# Patient Record
Sex: Male | Born: 1965
Health system: Southern US, Community
[De-identification: ages and names within clinical notes are randomized; demographics above are authoritative.]

## PROBLEM LIST (undated history)

## (undated) ENCOUNTER — Emergency Department (HOSPITAL_COMMUNITY): Admission: EM | Payer: Medicare HMO

## (undated) DIAGNOSIS — B20 Human immunodeficiency virus [HIV] disease: Secondary | ICD-10-CM

## (undated) DIAGNOSIS — E1165 Type 2 diabetes mellitus with hyperglycemia: Secondary | ICD-10-CM

## (undated) DIAGNOSIS — L97509 Non-pressure chronic ulcer of other part of unspecified foot with unspecified severity: Secondary | ICD-10-CM

## (undated) DIAGNOSIS — G8929 Other chronic pain: Secondary | ICD-10-CM

## (undated) DIAGNOSIS — Z91148 Patient's other noncompliance with medication regimen for other reason: Secondary | ICD-10-CM

## (undated) DIAGNOSIS — IMO0002 Reserved for concepts with insufficient information to code with codable children: Secondary | ICD-10-CM

## (undated) DIAGNOSIS — I77819 Aortic ectasia, unspecified site: Secondary | ICD-10-CM

## (undated) DIAGNOSIS — E11621 Type 2 diabetes mellitus with foot ulcer: Secondary | ICD-10-CM

## (undated) DIAGNOSIS — M869 Osteomyelitis, unspecified: Secondary | ICD-10-CM

## (undated) DIAGNOSIS — A630 Anogenital (venereal) warts: Secondary | ICD-10-CM

## (undated) DIAGNOSIS — I219 Acute myocardial infarction, unspecified: Secondary | ICD-10-CM

## (undated) DIAGNOSIS — K219 Gastro-esophageal reflux disease without esophagitis: Secondary | ICD-10-CM

## (undated) DIAGNOSIS — L02611 Cutaneous abscess of right foot: Secondary | ICD-10-CM

## (undated) DIAGNOSIS — N184 Chronic kidney disease, stage 4 (severe): Secondary | ICD-10-CM

## (undated) DIAGNOSIS — I2510 Atherosclerotic heart disease of native coronary artery without angina pectoris: Secondary | ICD-10-CM

## (undated) DIAGNOSIS — G629 Polyneuropathy, unspecified: Secondary | ICD-10-CM

## (undated) DIAGNOSIS — E785 Hyperlipidemia, unspecified: Secondary | ICD-10-CM

## (undated) DIAGNOSIS — J189 Pneumonia, unspecified organism: Secondary | ICD-10-CM

## (undated) DIAGNOSIS — D649 Anemia, unspecified: Secondary | ICD-10-CM

## (undated) DIAGNOSIS — N529 Male erectile dysfunction, unspecified: Secondary | ICD-10-CM

## (undated) DIAGNOSIS — Z9289 Personal history of other medical treatment: Secondary | ICD-10-CM

## (undated) DIAGNOSIS — N182 Chronic kidney disease, stage 2 (mild): Secondary | ICD-10-CM

## (undated) DIAGNOSIS — I10 Essential (primary) hypertension: Secondary | ICD-10-CM

## (undated) DIAGNOSIS — Z9114 Patient's other noncompliance with medication regimen: Secondary | ICD-10-CM

## (undated) DIAGNOSIS — M25569 Pain in unspecified knee: Secondary | ICD-10-CM

## (undated) HISTORY — DX: Hyperlipidemia, unspecified: E78.5

## (undated) HISTORY — DX: Osteomyelitis, unspecified: M86.9

## (undated) HISTORY — DX: Anemia, unspecified: D64.9

## (undated) HISTORY — DX: Patient's other noncompliance with medication regimen for other reason: Z91.148

## (undated) HISTORY — DX: Aortic ectasia, unspecified site: I77.819

## (undated) HISTORY — DX: Anogenital (venereal) warts: A63.0

## (undated) HISTORY — DX: Human immunodeficiency virus (HIV) disease: B20

## (undated) HISTORY — DX: Other chronic pain: G89.29

## (undated) HISTORY — DX: Cutaneous abscess of right foot: L02.611

## (undated) HISTORY — DX: Male erectile dysfunction, unspecified: N52.9

## (undated) HISTORY — DX: Type 2 diabetes mellitus with hyperglycemia: E11.65

## (undated) HISTORY — DX: Chronic kidney disease, stage 4 (severe): N18.4

## (undated) HISTORY — DX: Atherosclerotic heart disease of native coronary artery without angina pectoris: I25.10

## (undated) HISTORY — PX: HERNIA REPAIR: SHX51

## (undated) HISTORY — PX: BELOW KNEE LEG AMPUTATION: SUR23

## (undated) HISTORY — DX: Essential (primary) hypertension: I10

## (undated) HISTORY — DX: Patient's other noncompliance with medication regimen: Z91.14

## (undated) HISTORY — DX: Pain in unspecified knee: M25.569

## (undated) HISTORY — DX: Reserved for concepts with insufficient information to code with codable children: IMO0002

---
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
## 1997-12-05 ENCOUNTER — Emergency Department (HOSPITAL_COMMUNITY): Admission: EM | Admit: 1997-12-05 | Discharge: 1997-12-05 | Payer: Self-pay | Admitting: Emergency Medicine

## 1997-12-05 ENCOUNTER — Encounter: Payer: Self-pay | Admitting: Emergency Medicine

## 1998-07-27 ENCOUNTER — Emergency Department (HOSPITAL_COMMUNITY): Admission: EM | Admit: 1998-07-27 | Discharge: 1998-07-27 | Payer: Self-pay | Admitting: *Deleted

## 1998-08-31 ENCOUNTER — Emergency Department (HOSPITAL_COMMUNITY): Admission: EM | Admit: 1998-08-31 | Discharge: 1998-08-31 | Payer: Self-pay | Admitting: Emergency Medicine

## 1999-06-08 ENCOUNTER — Emergency Department (HOSPITAL_COMMUNITY): Admission: EM | Admit: 1999-06-08 | Discharge: 1999-06-08 | Payer: Self-pay | Admitting: Emergency Medicine

## 1999-06-11 ENCOUNTER — Emergency Department (HOSPITAL_COMMUNITY): Admission: EM | Admit: 1999-06-11 | Discharge: 1999-06-11 | Payer: Self-pay | Admitting: Emergency Medicine

## 1999-06-12 ENCOUNTER — Emergency Department (HOSPITAL_COMMUNITY): Admission: EM | Admit: 1999-06-12 | Discharge: 1999-06-12 | Payer: Self-pay | Admitting: Emergency Medicine

## 2001-01-04 ENCOUNTER — Emergency Department (HOSPITAL_COMMUNITY): Admission: EM | Admit: 2001-01-04 | Discharge: 2001-01-04 | Payer: Self-pay | Admitting: Emergency Medicine

## 2001-08-23 ENCOUNTER — Emergency Department (HOSPITAL_COMMUNITY): Admission: EM | Admit: 2001-08-23 | Discharge: 2001-08-23 | Payer: Self-pay | Admitting: Emergency Medicine

## 2001-10-03 ENCOUNTER — Emergency Department (HOSPITAL_COMMUNITY): Admission: EM | Admit: 2001-10-03 | Discharge: 2001-10-03 | Payer: Self-pay | Admitting: Emergency Medicine

## 2003-01-12 ENCOUNTER — Emergency Department (HOSPITAL_COMMUNITY): Admission: EM | Admit: 2003-01-12 | Discharge: 2003-01-12 | Payer: Self-pay | Admitting: Emergency Medicine

## 2003-07-23 ENCOUNTER — Emergency Department (HOSPITAL_COMMUNITY): Admission: EM | Admit: 2003-07-23 | Discharge: 2003-07-23 | Payer: Self-pay

## 2003-09-02 ENCOUNTER — Emergency Department (HOSPITAL_COMMUNITY): Admission: EM | Admit: 2003-09-02 | Discharge: 2003-09-02 | Payer: Self-pay | Admitting: Emergency Medicine

## 2004-07-03 ENCOUNTER — Ambulatory Visit: Payer: Self-pay | Admitting: Internal Medicine

## 2004-07-03 ENCOUNTER — Ambulatory Visit: Payer: Self-pay | Admitting: *Deleted

## 2004-07-25 ENCOUNTER — Ambulatory Visit: Payer: Self-pay | Admitting: Internal Medicine

## 2004-08-29 ENCOUNTER — Ambulatory Visit: Payer: Self-pay | Admitting: Internal Medicine

## 2004-09-14 ENCOUNTER — Ambulatory Visit: Payer: Self-pay | Admitting: Internal Medicine

## 2004-09-17 ENCOUNTER — Ambulatory Visit: Payer: Self-pay | Admitting: Internal Medicine

## 2005-01-29 ENCOUNTER — Emergency Department (HOSPITAL_COMMUNITY): Admission: EM | Admit: 2005-01-29 | Discharge: 2005-01-29 | Payer: Self-pay | Admitting: Emergency Medicine

## 2005-03-19 ENCOUNTER — Ambulatory Visit: Payer: Self-pay | Admitting: Family Medicine

## 2005-04-10 ENCOUNTER — Ambulatory Visit: Payer: Self-pay | Admitting: Internal Medicine

## 2005-06-05 ENCOUNTER — Ambulatory Visit: Payer: Self-pay | Admitting: Internal Medicine

## 2005-06-12 ENCOUNTER — Ambulatory Visit: Payer: Self-pay | Admitting: Internal Medicine

## 2005-06-18 ENCOUNTER — Ambulatory Visit: Payer: Self-pay | Admitting: Internal Medicine

## 2005-08-22 ENCOUNTER — Ambulatory Visit: Payer: Self-pay | Admitting: Internal Medicine

## 2005-11-21 ENCOUNTER — Ambulatory Visit: Payer: Self-pay | Admitting: Internal Medicine

## 2005-11-21 LAB — CONVERTED CEMR LAB
Hep B S Ab: NEGATIVE
Hepatitis B Surface Ag: NEGATIVE

## 2006-09-08 ENCOUNTER — Ambulatory Visit: Payer: Self-pay | Admitting: Internal Medicine

## 2006-10-06 DIAGNOSIS — B20 Human immunodeficiency virus [HIV] disease: Secondary | ICD-10-CM | POA: Insufficient documentation

## 2006-12-24 ENCOUNTER — Encounter (INDEPENDENT_AMBULATORY_CARE_PROVIDER_SITE_OTHER): Payer: Self-pay | Admitting: *Deleted

## 2007-03-31 ENCOUNTER — Ambulatory Visit: Payer: Self-pay | Admitting: Internal Medicine

## 2007-04-07 ENCOUNTER — Emergency Department (HOSPITAL_COMMUNITY): Admission: EM | Admit: 2007-04-07 | Discharge: 2007-04-07 | Payer: Self-pay | Admitting: Emergency Medicine

## 2007-04-09 DIAGNOSIS — Z21 Asymptomatic human immunodeficiency virus [HIV] infection status: Secondary | ICD-10-CM

## 2007-04-09 DIAGNOSIS — B20 Human immunodeficiency virus [HIV] disease: Secondary | ICD-10-CM

## 2007-04-09 HISTORY — DX: Asymptomatic human immunodeficiency virus (hiv) infection status: Z21

## 2007-04-09 HISTORY — DX: Human immunodeficiency virus (HIV) disease: B20

## 2007-05-01 ENCOUNTER — Emergency Department (HOSPITAL_COMMUNITY): Admission: EM | Admit: 2007-05-01 | Discharge: 2007-05-01 | Payer: Self-pay | Admitting: Emergency Medicine

## 2007-05-21 ENCOUNTER — Ambulatory Visit: Payer: Self-pay | Admitting: Internal Medicine

## 2007-10-05 ENCOUNTER — Encounter (INDEPENDENT_AMBULATORY_CARE_PROVIDER_SITE_OTHER): Payer: Self-pay | Admitting: Family Medicine

## 2007-11-04 ENCOUNTER — Emergency Department (HOSPITAL_COMMUNITY): Admission: EM | Admit: 2007-11-04 | Discharge: 2007-11-04 | Payer: Self-pay | Admitting: Emergency Medicine

## 2007-11-04 ENCOUNTER — Ambulatory Visit (HOSPITAL_COMMUNITY): Admission: RE | Admit: 2007-11-04 | Discharge: 2007-11-04 | Payer: Self-pay | Admitting: Emergency Medicine

## 2007-11-26 ENCOUNTER — Ambulatory Visit: Payer: Self-pay | Admitting: Internal Medicine

## 2008-03-24 ENCOUNTER — Ambulatory Visit: Payer: Self-pay | Admitting: Internal Medicine

## 2008-04-04 ENCOUNTER — Ambulatory Visit: Payer: Self-pay | Admitting: Internal Medicine

## 2008-04-14 ENCOUNTER — Ambulatory Visit: Payer: Self-pay | Admitting: Internal Medicine

## 2008-04-14 LAB — CONVERTED CEMR LAB: CD4 Count: 198 microliters

## 2008-04-15 ENCOUNTER — Encounter: Payer: Self-pay | Admitting: Internal Medicine

## 2008-04-15 LAB — CONVERTED CEMR LAB
ALT: 28 units/L (ref 0–53)
AST: 22 units/L (ref 0–37)
Absolute CD4: 198 #/uL — ABNORMAL LOW (ref 381–1469)
Albumin: 4.6 g/dL (ref 3.5–5.2)
Alkaline Phosphatase: 86 units/L (ref 39–117)
BUN: 25 mg/dL — ABNORMAL HIGH (ref 6–23)
Basophils Absolute: 0 10*3/uL (ref 0.0–0.1)
Basophils Relative: 0 % (ref 0–1)
CD4 T Helper %: 10 % — ABNORMAL LOW (ref 32–62)
CO2: 20 meq/L (ref 19–32)
Calcium: 9.9 mg/dL (ref 8.4–10.5)
Chlamydia, Swab/Urine, PCR: NEGATIVE
Chloride: 95 meq/L — ABNORMAL LOW (ref 96–112)
Creatinine, Ser: 1.29 mg/dL (ref 0.40–1.50)
Eosinophils Absolute: 0.1 10*3/uL (ref 0.0–0.7)
Eosinophils Relative: 1 % (ref 0–5)
GC Probe Amp, Urine: NEGATIVE
Glucose, Bld: 346 mg/dL — ABNORMAL HIGH (ref 70–99)
HCT: 41.6 % (ref 39.0–52.0)
HIV 1 RNA Quant: <48 copies/mL (ref <48)
HIV-1 RNA Quant, Log: <1.68 (ref <1.68)
Hemoglobin: 13.5 g/dL (ref 13.0–17.0)
Lymphocytes Relative: 33 % (ref 12–46)
Lymphs Abs: 2 10*3/uL (ref 0.7–4.0)
MCHC: 32.5 g/dL (ref 30.0–36.0)
MCV: 85.1 fL (ref 78.0–100.0)
Microalb, Ur: 5.31 mg/dL — ABNORMAL HIGH (ref 0.00–1.89)
Monocytes Absolute: 0.4 10*3/uL (ref 0.1–1.0)
Monocytes Relative: 7 % (ref 3–12)
Neutro Abs: 3.5 10*3/uL (ref 1.7–7.7)
Neutrophils Relative %: 59 % (ref 43–77)
Platelets: 199 10*3/uL (ref 150–400)
Potassium: 4.7 meq/L (ref 3.5–5.3)
RBC: 4.89 M/uL (ref 4.22–5.81)
RDW: 13.5 % (ref 11.5–15.5)
Sodium: 132 meq/L — ABNORMAL LOW (ref 135–145)
Testosterone: 238.95 ng/dL — ABNORMAL LOW (ref 350–890)
Total Bilirubin: 1.8 mg/dL — ABNORMAL HIGH (ref 0.3–1.2)
Total Lymphocytes %: 33 % (ref 12–46)
Total Protein: 8.7 g/dL — ABNORMAL HIGH (ref 6.0–8.3)
Total lymphocyte count: 1980 cells/mcL (ref 700–3300)
WBC, lymph enumeration: 6 10*3/uL (ref 4.0–10.5)
WBC: 6 10*3/uL (ref 4.0–10.5)

## 2008-04-21 ENCOUNTER — Ambulatory Visit: Payer: Self-pay | Admitting: Internal Medicine

## 2008-05-05 ENCOUNTER — Ambulatory Visit: Payer: Self-pay | Admitting: Internal Medicine

## 2008-05-19 ENCOUNTER — Encounter: Payer: Self-pay | Admitting: Internal Medicine

## 2008-05-19 ENCOUNTER — Ambulatory Visit: Payer: Self-pay | Admitting: Internal Medicine

## 2008-05-19 DIAGNOSIS — I10 Essential (primary) hypertension: Secondary | ICD-10-CM | POA: Insufficient documentation

## 2008-05-19 DIAGNOSIS — E119 Type 2 diabetes mellitus without complications: Secondary | ICD-10-CM | POA: Insufficient documentation

## 2008-05-19 LAB — CONVERTED CEMR LAB: Blood Glucose, Fingerstick: 188

## 2008-07-15 ENCOUNTER — Telehealth: Payer: Self-pay | Admitting: Internal Medicine

## 2008-07-18 ENCOUNTER — Encounter: Payer: Self-pay | Admitting: Internal Medicine

## 2008-07-18 LAB — CONVERTED CEMR LAB: HCV Ab: NEGATIVE

## 2008-08-03 ENCOUNTER — Telehealth: Payer: Self-pay | Admitting: Internal Medicine

## 2008-08-09 ENCOUNTER — Ambulatory Visit: Payer: Self-pay | Admitting: Internal Medicine

## 2008-08-11 ENCOUNTER — Encounter: Payer: Self-pay | Admitting: Internal Medicine

## 2008-08-11 ENCOUNTER — Ambulatory Visit: Payer: Self-pay | Admitting: Internal Medicine

## 2008-08-11 DIAGNOSIS — A630 Anogenital (venereal) warts: Secondary | ICD-10-CM | POA: Insufficient documentation

## 2008-08-11 LAB — CONVERTED CEMR LAB
ALT: 23 units/L (ref 0–53)
AST: 28 units/L (ref 0–37)
Absolute CD4: 209 #/uL — ABNORMAL LOW (ref 381–1469)
Albumin: 4.6 g/dL (ref 3.5–5.2)
Alkaline Phosphatase: 54 units/L (ref 39–117)
BUN: 12 mg/dL (ref 6–23)
Basophils Absolute: 0 10*3/uL (ref 0.0–0.1)
Basophils Relative: 0 % (ref 0–1)
Blood Glucose, Fingerstick: 164
CD4 T Helper %: 12 % — ABNORMAL LOW (ref 32–62)
CO2: 26 meq/L (ref 19–32)
Calcium: 10 mg/dL (ref 8.4–10.5)
Chloride: 104 meq/L (ref 96–112)
Creatinine, Ser: 1.08 mg/dL (ref 0.40–1.50)
Eosinophils Absolute: 0.1 10*3/uL (ref 0.0–0.7)
Eosinophils Relative: 1 % (ref 0–5)
Glucose, Bld: 112 mg/dL — ABNORMAL HIGH (ref 70–99)
HCT: 41.7 % (ref 39.0–52.0)
HIV 1 RNA Quant: 231 copies/mL — ABNORMAL HIGH (ref <48)
HIV-1 RNA Quant, Log: 2.36 — ABNORMAL HIGH (ref <1.68)
Hemoglobin: 13.6 g/dL (ref 13.0–17.0)
Lymphocytes Relative: 22 % (ref 12–46)
Lymphs Abs: 1.7 10*3/uL (ref 0.7–4.0)
MCHC: 32.6 g/dL (ref 30.0–36.0)
MCV: 84.1 fL (ref 78.0–100.0)
Monocytes Absolute: 0.4 10*3/uL (ref 0.1–1.0)
Monocytes Relative: 5 % (ref 3–12)
Neutro Abs: 5.7 10*3/uL (ref 1.7–7.7)
Neutrophils Relative %: 72 % (ref 43–77)
Platelets: 250 10*3/uL (ref 150–400)
Potassium: 4.1 meq/L (ref 3.5–5.3)
RBC: 4.96 M/uL (ref 4.22–5.81)
RDW: 13.8 % (ref 11.5–15.5)
Sodium: 141 meq/L (ref 135–145)
Total Bilirubin: 0.4 mg/dL (ref 0.3–1.2)
Total Lymphocytes %: 22 % (ref 12–46)
Total Protein: 8.2 g/dL (ref 6.0–8.3)
Total lymphocyte count: 1738 cells/mcL (ref 700–3300)
WBC, lymph enumeration: 7.9 10*3/uL (ref 4.0–10.5)
WBC: 7.9 10*3/uL (ref 4.0–10.5)

## 2008-08-22 ENCOUNTER — Ambulatory Visit: Payer: Self-pay | Admitting: Internal Medicine

## 2008-08-22 ENCOUNTER — Encounter: Payer: Self-pay | Admitting: Internal Medicine

## 2008-08-22 DIAGNOSIS — B359 Dermatophytosis, unspecified: Secondary | ICD-10-CM | POA: Insufficient documentation

## 2008-08-22 DIAGNOSIS — F528 Other sexual dysfunction not due to a substance or known physiological condition: Secondary | ICD-10-CM | POA: Insufficient documentation

## 2008-08-22 LAB — CONVERTED CEMR LAB: Blood Glucose, Fingerstick: 97

## 2008-09-06 ENCOUNTER — Telehealth: Payer: Self-pay | Admitting: Internal Medicine
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
## 2008-09-30 IMAGING — CR DG FOREARM 2V*R*
2 series · 2 of 2 positions shown · non-contrast
Comparison: No priors

CLINICAL DATA: Right wrist injury

RIGHT FOREARM - 2 VIEW

[view not recorded (1 of 2)]
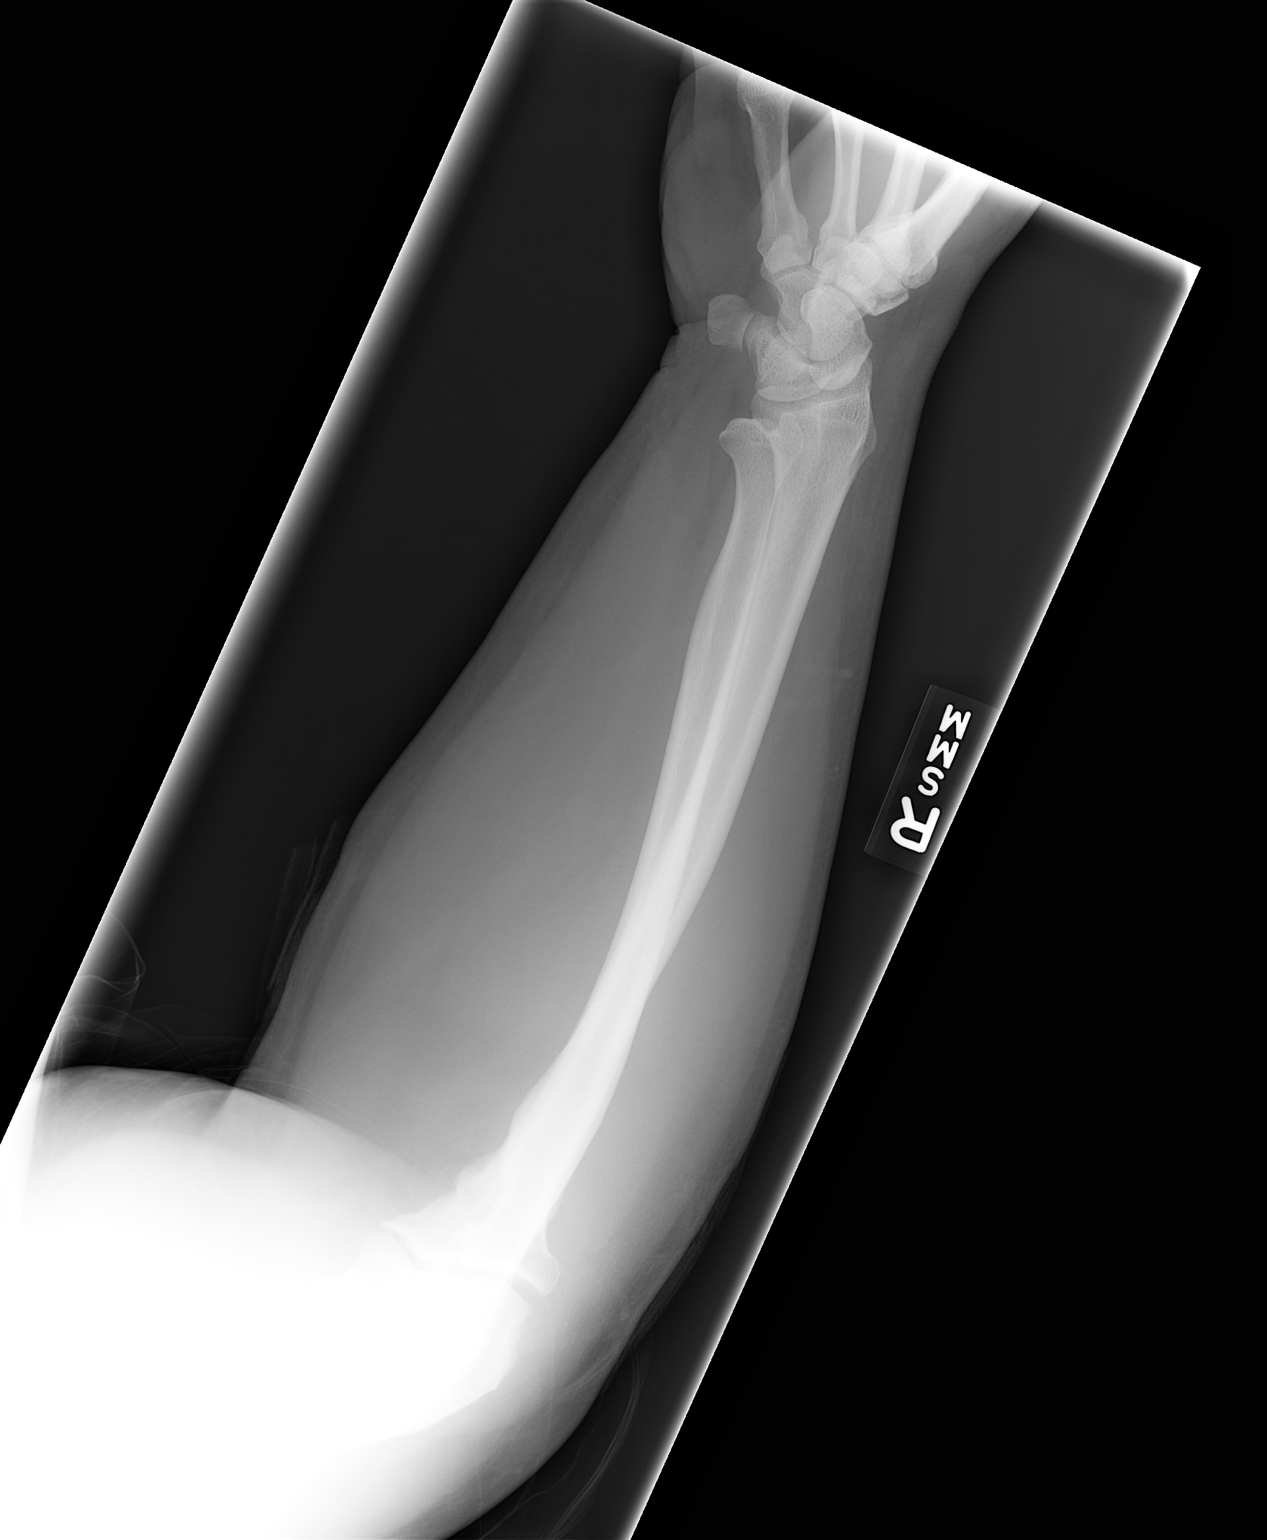

[view not recorded (2 of 2)]
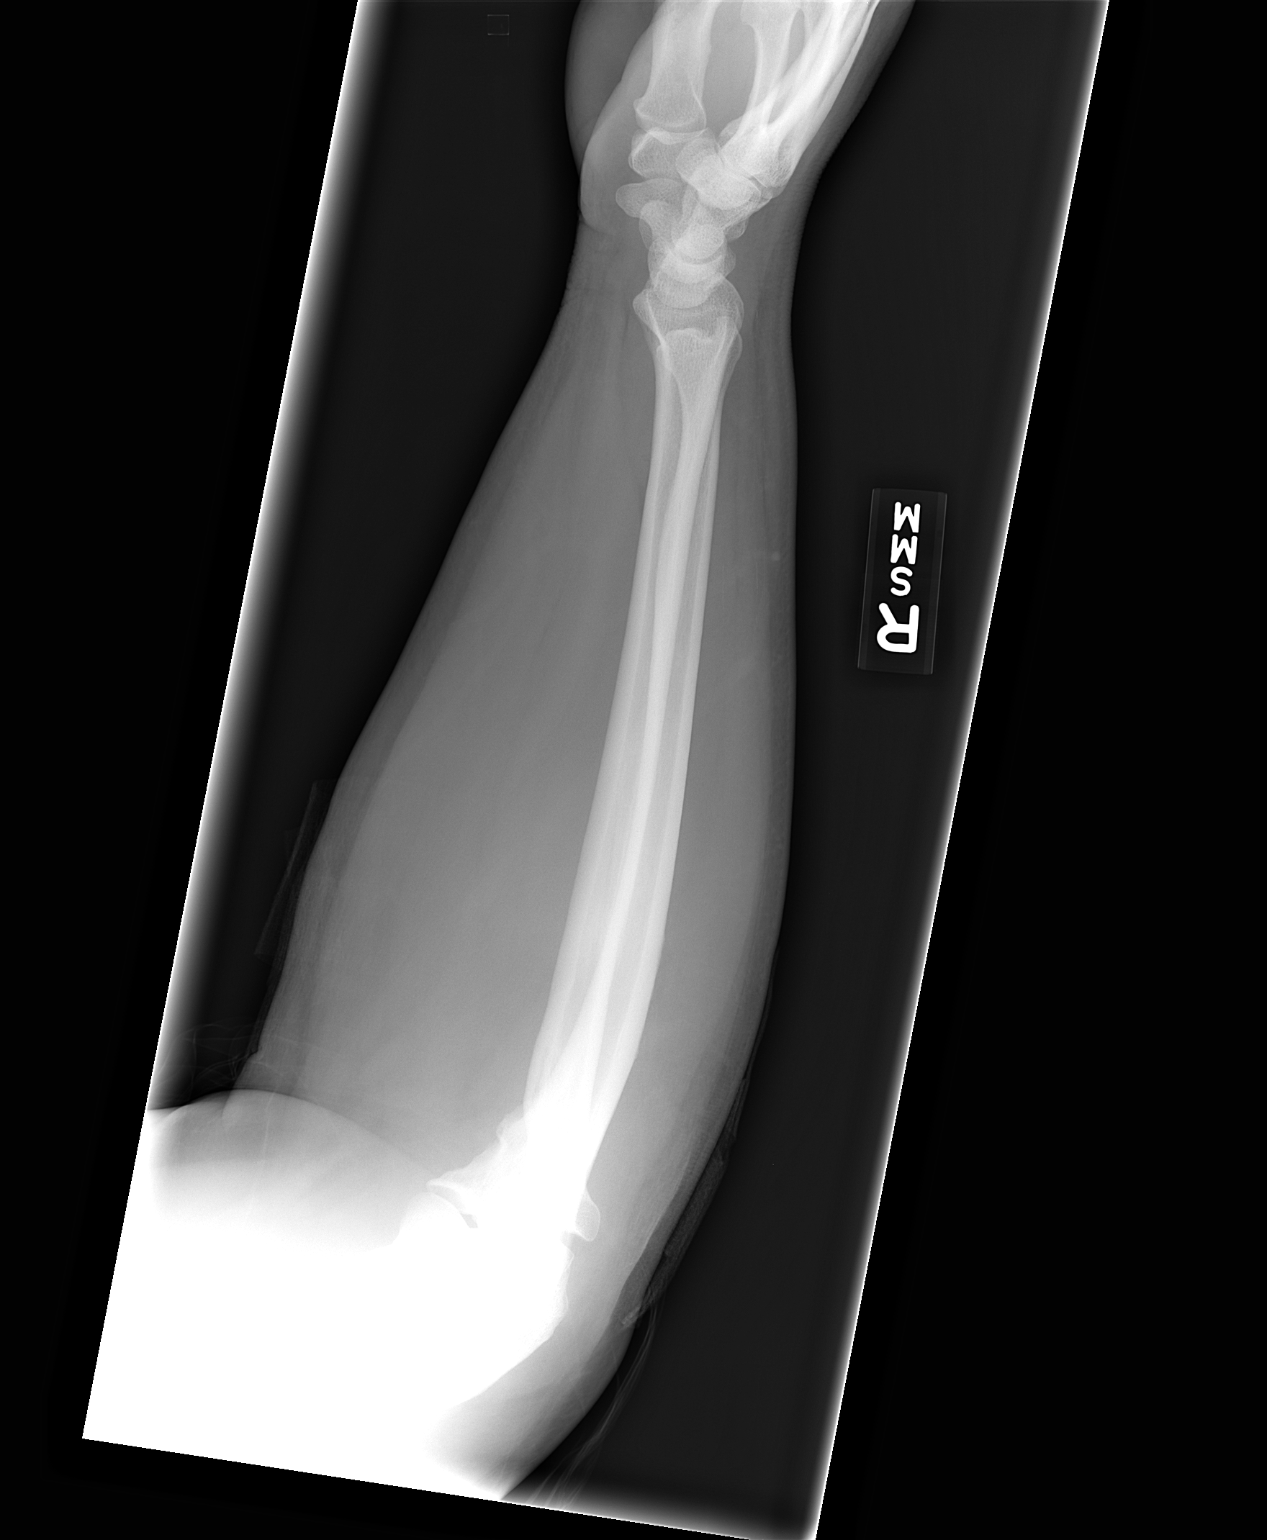

[2 of 2 positions shown; findings below may reference images not displayed]

FINDINGS: There is no evidence of fracture or other focal bone
lesions.  Soft tissues are unremarkable.
IMPRESSION: Negative.

## 2008-11-24 ENCOUNTER — Emergency Department (HOSPITAL_COMMUNITY): Admission: EM | Admit: 2008-11-24 | Discharge: 2008-11-24 | Payer: Self-pay | Admitting: Emergency Medicine

## 2008-12-03 ENCOUNTER — Emergency Department (HOSPITAL_COMMUNITY): Admission: EM | Admit: 2008-12-03 | Discharge: 2008-12-03 | Payer: Self-pay | Admitting: Family Medicine

## 2008-12-08 ENCOUNTER — Encounter: Payer: Self-pay | Admitting: Internal Medicine

## 2008-12-08 ENCOUNTER — Ambulatory Visit: Payer: Self-pay | Admitting: Internal Medicine

## 2008-12-08 LAB — CONVERTED CEMR LAB: Blood Glucose, Fingerstick: 218

## 2008-12-13 ENCOUNTER — Telehealth: Payer: Self-pay | Admitting: Internal Medicine

## 2008-12-14 ENCOUNTER — Encounter (INDEPENDENT_AMBULATORY_CARE_PROVIDER_SITE_OTHER): Payer: Self-pay | Admitting: *Deleted

## 2008-12-14 LAB — CONVERTED CEMR LAB
ALT: 30 units/L (ref 0–53)
AST: 33 units/L (ref 0–37)
Absolute CD4: 180 #/uL — ABNORMAL LOW (ref 381–1469)
Albumin: 4.5 g/dL (ref 3.5–5.2)
Alkaline Phosphatase: 60 units/L (ref 39–117)
BUN: 13 mg/dL (ref 6–23)
Basophils Absolute: 0 10*3/uL (ref 0.0–0.1)
Basophils Relative: 0 % (ref 0–1)
CD4 T Helper %: 11 % — ABNORMAL LOW (ref 32–62)
CO2: 22 meq/L (ref 19–32)
Calcium: 9.7 mg/dL (ref 8.4–10.5)
Chloride: 103 meq/L (ref 96–112)
Cholesterol: 240 mg/dL — ABNORMAL HIGH (ref 0–200)
Creatinine, Ser: 0.92 mg/dL (ref 0.40–1.50)
Eosinophils Absolute: 0.1 10*3/uL (ref 0.0–0.7)
Eosinophils Relative: 1 % (ref 0–5)
Glucose, Bld: 193 mg/dL — ABNORMAL HIGH (ref 70–99)
HCT: 40 % (ref 39.0–52.0)
HDL: 34 mg/dL — ABNORMAL LOW (ref >39)
HIV 1 RNA Quant: 2700 copies/mL — ABNORMAL HIGH (ref <48)
HIV-1 RNA Quant, Log: 3.43 — ABNORMAL HIGH (ref <1.68)
Hemoglobin: 12.7 g/dL — ABNORMAL LOW (ref 13.0–17.0)
LDL Cholesterol: 159 mg/dL — ABNORMAL HIGH (ref 0–99)
Lymphocytes Relative: 40 % (ref 12–46)
Lymphs Abs: 1.7 10*3/uL (ref 0.7–4.0)
MCHC: 31.8 g/dL (ref 30.0–36.0)
MCV: 86.4 fL (ref 78.0–100.0)
Monocytes Absolute: 0.7 10*3/uL (ref 0.1–1.0)
Monocytes Relative: 17 % — ABNORMAL HIGH (ref 3–12)
Neutro Abs: 1.7 10*3/uL (ref 1.7–7.7)
Neutrophils Relative %: 41 % — ABNORMAL LOW (ref 43–77)
Platelets: 240 10*3/uL (ref 150–400)
Potassium: 4 meq/L (ref 3.5–5.3)
RBC: 4.63 M/uL (ref 4.22–5.81)
RDW: 14.3 % (ref 11.5–15.5)
Sodium: 139 meq/L (ref 135–145)
Total Bilirubin: 1.1 mg/dL (ref 0.3–1.2)
Total CHOL/HDL Ratio: 7.1
Total Protein: 8.2 g/dL (ref 6.0–8.3)
Total lymphocyte count: 1640 cells/mcL (ref 700–3300)
Triglycerides: 234 mg/dL — ABNORMAL HIGH (ref <150)
VLDL: 47 mg/dL — ABNORMAL HIGH (ref 0–40)
WBC: 4.1 10*3/uL (ref 4.0–10.5)

## 2008-12-26 ENCOUNTER — Telehealth: Payer: Self-pay | Admitting: Internal Medicine

## 2009-01-02 ENCOUNTER — Encounter: Payer: Self-pay | Admitting: Internal Medicine

## 2009-01-02 ENCOUNTER — Ambulatory Visit: Payer: Self-pay | Admitting: Internal Medicine

## 2009-01-02 LAB — CONVERTED CEMR LAB: Blood Glucose, Fingerstick: 592

## 2009-01-05 ENCOUNTER — Ambulatory Visit: Payer: Self-pay | Admitting: Internal Medicine

## 2009-01-05 ENCOUNTER — Encounter: Payer: Self-pay | Admitting: Internal Medicine

## 2009-01-05 LAB — CONVERTED CEMR LAB: Blood Glucose, Fingerstick: 487

## 2009-01-09 ENCOUNTER — Telehealth: Payer: Self-pay | Admitting: Internal Medicine

## 2009-01-12 ENCOUNTER — Encounter: Payer: Self-pay | Admitting: Internal Medicine

## 2009-01-12 ENCOUNTER — Ambulatory Visit: Payer: Self-pay | Admitting: Internal Medicine

## 2009-01-12 DIAGNOSIS — L03019 Cellulitis of unspecified finger: Secondary | ICD-10-CM | POA: Insufficient documentation

## 2009-01-12 LAB — CONVERTED CEMR LAB: Blood Glucose, Fingerstick: >600

## 2009-01-25 ENCOUNTER — Inpatient Hospital Stay (HOSPITAL_COMMUNITY): Admission: EM | Admit: 2009-01-25 | Discharge: 2009-01-28 | Payer: Self-pay | Admitting: Emergency Medicine

## 2009-02-07 ENCOUNTER — Telehealth: Payer: Self-pay | Admitting: Internal Medicine

## 2009-02-07 ENCOUNTER — Inpatient Hospital Stay (HOSPITAL_COMMUNITY): Admission: EM | Admit: 2009-02-07 | Discharge: 2009-02-11 | Payer: Self-pay | Admitting: Emergency Medicine

## 2009-02-13 ENCOUNTER — Emergency Department (HOSPITAL_COMMUNITY): Admission: EM | Admit: 2009-02-13 | Discharge: 2009-02-14 | Payer: Self-pay | Admitting: Emergency Medicine

## 2009-02-16 ENCOUNTER — Ambulatory Visit: Payer: Self-pay | Admitting: Family Medicine

## 2009-02-16 ENCOUNTER — Encounter: Payer: Self-pay | Admitting: Internal Medicine

## 2009-02-16 LAB — CONVERTED CEMR LAB: Blood Glucose, Fingerstick: 257

## 2009-02-28 ENCOUNTER — Encounter: Payer: Self-pay | Admitting: Internal Medicine

## 2009-03-05 ENCOUNTER — Observation Stay (HOSPITAL_COMMUNITY): Admission: EM | Admit: 2009-03-05 | Discharge: 2009-03-06 | Payer: Self-pay | Admitting: Emergency Medicine

## 2009-03-06 ENCOUNTER — Telehealth: Payer: Self-pay | Admitting: Internal Medicine

## 2009-03-06 ENCOUNTER — Telehealth (INDEPENDENT_AMBULATORY_CARE_PROVIDER_SITE_OTHER): Payer: Self-pay | Admitting: *Deleted

## 2009-03-13 ENCOUNTER — Encounter: Admission: RE | Admit: 2009-03-13 | Discharge: 2009-04-05 | Payer: Self-pay | Admitting: Emergency Medicine

## 2009-03-20 ENCOUNTER — Inpatient Hospital Stay (HOSPITAL_COMMUNITY): Admission: EM | Admit: 2009-03-20 | Discharge: 2009-03-24 | Payer: Self-pay | Admitting: Emergency Medicine

## 2009-03-25 ENCOUNTER — Emergency Department (HOSPITAL_COMMUNITY): Admission: EM | Admit: 2009-03-25 | Discharge: 2009-03-26 | Payer: Self-pay | Admitting: Emergency Medicine

## 2009-03-27 ENCOUNTER — Emergency Department (HOSPITAL_COMMUNITY): Admission: EM | Admit: 2009-03-27 | Discharge: 2009-03-27 | Payer: Self-pay | Admitting: Emergency Medicine

## 2009-03-30 ENCOUNTER — Ambulatory Visit: Payer: Self-pay | Admitting: Internal Medicine

## 2009-03-30 ENCOUNTER — Encounter: Payer: Self-pay | Admitting: Internal Medicine

## 2009-03-30 LAB — CONVERTED CEMR LAB: Blood Glucose, Fingerstick: 168

## 2009-04-13 ENCOUNTER — Encounter: Payer: Self-pay | Admitting: Internal Medicine

## 2009-04-13 ENCOUNTER — Ambulatory Visit: Payer: Self-pay | Admitting: Internal Medicine

## 2009-04-13 LAB — CONVERTED CEMR LAB: Blood Glucose, Fingerstick: 170

## 2009-04-19 LAB — CONVERTED CEMR LAB
ALT: 15 units/L (ref 0–53)
AST: 16 units/L (ref 0–37)
Absolute CD4: 220 #/uL — ABNORMAL LOW (ref 381–1469)
Albumin: 4.3 g/dL (ref 3.5–5.2)
Alkaline Phosphatase: 64 units/L (ref 39–117)
BUN: 12 mg/dL (ref 6–23)
Basophils Absolute: 0 10*3/uL (ref 0.0–0.1)
Basophils Relative: 0 % (ref 0–1)
CD4 T Helper %: 12 % — ABNORMAL LOW (ref 32–62)
CO2: 24 meq/L (ref 19–32)
Calcium: 9 mg/dL (ref 8.4–10.5)
Chloride: 104 meq/L (ref 96–112)
Creatinine, Ser: 0.88 mg/dL (ref 0.40–1.50)
Eosinophils Absolute: 0.1 10*3/uL (ref 0.0–0.7)
Eosinophils Relative: 1 % (ref 0–5)
Glucose, Bld: 144 mg/dL — ABNORMAL HIGH (ref 70–99)
HCT: 36.5 % — ABNORMAL LOW (ref 39.0–52.0)
HIV 1 RNA Quant: 4020 copies/mL — ABNORMAL HIGH (ref <48)
HIV-1 RNA Quant, Log: 3.6 — ABNORMAL HIGH (ref <1.68)
Hemoglobin: 11.6 g/dL — ABNORMAL LOW (ref 13.0–17.0)
Lymphocytes Relative: 34 % (ref 12–46)
Lymphs Abs: 1.8 10*3/uL (ref 0.7–4.0)
MCHC: 31.8 g/dL (ref 30.0–36.0)
MCV: 83.3 fL (ref 78.0–100.0)
Monocytes Absolute: 0.4 10*3/uL (ref 0.1–1.0)
Monocytes Relative: 7 % (ref 3–12)
Neutro Abs: 3.1 10*3/uL (ref 1.7–7.7)
Neutrophils Relative %: 58 % (ref 43–77)
Platelets: 242 10*3/uL (ref 150–400)
Potassium: 3.9 meq/L (ref 3.5–5.3)
RBC: 4.38 M/uL (ref 4.22–5.81)
RDW: 13.9 % (ref 11.5–15.5)
Sodium: 139 meq/L (ref 135–145)
Total Bilirubin: 0.3 mg/dL (ref 0.3–1.2)
Total Protein: 7.4 g/dL (ref 6.0–8.3)
Total lymphocyte count: 1836 cells/mcL (ref 700–3300)
WBC: 5.4 10*3/uL (ref 4.0–10.5)

## 2009-04-20 ENCOUNTER — Telehealth: Payer: Self-pay | Admitting: Internal Medicine

## 2009-04-20 ENCOUNTER — Encounter: Payer: Self-pay | Admitting: Internal Medicine

## 2009-04-20 LAB — CONVERTED CEMR LAB

## 2009-04-21 ENCOUNTER — Telehealth: Payer: Self-pay | Admitting: Internal Medicine

## 2009-04-24 ENCOUNTER — Telehealth: Payer: Self-pay | Admitting: Internal Medicine

## 2009-04-27 ENCOUNTER — Encounter: Payer: Self-pay | Admitting: Internal Medicine

## 2009-04-27 ENCOUNTER — Ambulatory Visit: Payer: Self-pay | Admitting: Internal Medicine

## 2009-04-27 LAB — CONVERTED CEMR LAB: Blood Glucose, Fingerstick: 276

## 2009-05-15 ENCOUNTER — Observation Stay (HOSPITAL_COMMUNITY)
Admission: EM | Admit: 2009-05-15 | Discharge: 2009-05-16 | Payer: Self-pay | Source: Home / Self Care | Admitting: Emergency Medicine

## 2009-05-15 ENCOUNTER — Ambulatory Visit: Payer: Self-pay | Admitting: Internal Medicine

## 2009-05-16 ENCOUNTER — Encounter: Payer: Self-pay | Admitting: Internal Medicine

## 2009-05-21 ENCOUNTER — Inpatient Hospital Stay (HOSPITAL_COMMUNITY)
Admission: EM | Admit: 2009-05-21 | Discharge: 2009-05-24 | Payer: Self-pay | Source: Home / Self Care | Admitting: Emergency Medicine

## 2009-05-21 ENCOUNTER — Encounter (INDEPENDENT_AMBULATORY_CARE_PROVIDER_SITE_OTHER): Payer: Self-pay | Admitting: Internal Medicine

## 2009-05-24 ENCOUNTER — Encounter: Payer: Self-pay | Admitting: Internal Medicine

## 2009-05-24 ENCOUNTER — Telehealth: Payer: Self-pay | Admitting: Internal Medicine

## 2009-05-25 ENCOUNTER — Encounter: Payer: Self-pay | Admitting: Internal Medicine

## 2009-05-25 ENCOUNTER — Ambulatory Visit: Payer: Self-pay | Admitting: Internal Medicine

## 2009-05-25 LAB — CONVERTED CEMR LAB: Blood Glucose, Fingerstick: 378

## 2009-06-28 ENCOUNTER — Inpatient Hospital Stay (HOSPITAL_COMMUNITY)
Admission: EM | Admit: 2009-06-28 | Discharge: 2009-07-01 | Payer: Self-pay | Source: Home / Self Care | Admitting: Emergency Medicine

## 2009-07-01 ENCOUNTER — Ambulatory Visit: Payer: Self-pay | Admitting: Vascular Surgery

## 2009-07-01 ENCOUNTER — Encounter (INDEPENDENT_AMBULATORY_CARE_PROVIDER_SITE_OTHER): Payer: Self-pay | Admitting: Internal Medicine

## 2009-07-04 ENCOUNTER — Encounter
Admission: RE | Admit: 2009-07-04 | Discharge: 2009-07-21 | Payer: Self-pay | Source: Home / Self Care | Admitting: Internal Medicine

## 2009-07-04 ENCOUNTER — Emergency Department (HOSPITAL_COMMUNITY): Admission: EM | Admit: 2009-07-04 | Discharge: 2009-07-05 | Payer: Self-pay | Admitting: Emergency Medicine

## 2009-07-08 ENCOUNTER — Inpatient Hospital Stay (HOSPITAL_COMMUNITY)
Admission: EM | Admit: 2009-07-08 | Discharge: 2009-07-12 | Payer: Self-pay | Source: Home / Self Care | Admitting: Emergency Medicine

## 2009-07-11 ENCOUNTER — Ambulatory Visit: Payer: Self-pay | Admitting: Infectious Diseases

## 2009-07-13 ENCOUNTER — Telehealth: Payer: Self-pay | Admitting: Internal Medicine

## 2009-07-15 ENCOUNTER — Emergency Department (HOSPITAL_COMMUNITY)
Admission: EM | Admit: 2009-07-15 | Discharge: 2009-07-15 | Payer: Self-pay | Source: Home / Self Care | Admitting: Emergency Medicine

## 2009-07-20 ENCOUNTER — Telehealth (INDEPENDENT_AMBULATORY_CARE_PROVIDER_SITE_OTHER): Payer: Self-pay | Admitting: *Deleted

## 2009-07-20 ENCOUNTER — Inpatient Hospital Stay (HOSPITAL_COMMUNITY)
Admission: EM | Admit: 2009-07-20 | Discharge: 2009-07-25 | Payer: Self-pay | Source: Home / Self Care | Admitting: Emergency Medicine

## 2009-07-20 ENCOUNTER — Ambulatory Visit: Payer: Self-pay | Admitting: Internal Medicine

## 2009-07-20 LAB — CONVERTED CEMR LAB
BUN: 24 mg/dL — ABNORMAL HIGH (ref 6–23)
Basophils Absolute: 0 10*3/uL (ref 0.0–0.1)
Basophils Relative: 0 % (ref 0–1)
CO2: 25 meq/L (ref 19–32)
Calcium: 10.5 mg/dL (ref 8.4–10.5)
Chloride: 77 meq/L — ABNORMAL LOW (ref 96–112)
Creatinine, Ser: 1.48 mg/dL (ref 0.40–1.50)
Eosinophils Absolute: 0 10*3/uL (ref 0.0–0.7)
Eosinophils Relative: 0 % (ref 0–5)
Glucose, Bld: 878 mg/dL (ref 70–99)
HCT: 39.6 % (ref 39.0–52.0)
HIV 1 RNA Quant: 430 copies/mL — ABNORMAL HIGH (ref <48)
HIV-1 RNA Quant, Log: 2.63 — ABNORMAL HIGH (ref <1.68)
Hemoglobin: 13.2 g/dL (ref 13.0–17.0)
Lymphocytes Relative: 25 % (ref 12–46)
Lymphs Abs: 1.8 10*3/uL (ref 0.7–4.0)
MCHC: 33.3 g/dL (ref 30.0–36.0)
MCV: 80.3 fL (ref 78.0–100.0)
Monocytes Absolute: 0.4 10*3/uL (ref 0.1–1.0)
Monocytes Relative: 5 % (ref 3–12)
Neutro Abs: 5 10*3/uL (ref 1.7–7.7)
Neutrophils Relative %: 70 % (ref 43–77)
Platelets: 291 10*3/uL (ref 150–400)
Potassium: 4.6 meq/L (ref 3.5–5.3)
RBC: 4.93 M/uL (ref 4.22–5.81)
RDW: 12.9 % (ref 11.5–15.5)
Sodium: 118 meq/L — ABNORMAL LOW (ref 135–145)
WBC: 7.2 10*3/uL (ref 4.0–10.5)

## 2009-07-25 ENCOUNTER — Encounter: Payer: Self-pay | Admitting: Internal Medicine

## 2009-07-26 ENCOUNTER — Encounter: Payer: Self-pay | Admitting: Internal Medicine

## 2009-07-26 ENCOUNTER — Encounter (INDEPENDENT_AMBULATORY_CARE_PROVIDER_SITE_OTHER): Payer: Self-pay | Admitting: *Deleted

## 2009-07-27 ENCOUNTER — Emergency Department (HOSPITAL_COMMUNITY)
Admission: EM | Admit: 2009-07-27 | Discharge: 2009-07-27 | Payer: Self-pay | Source: Home / Self Care | Admitting: Emergency Medicine

## 2009-07-27 ENCOUNTER — Telehealth: Payer: Self-pay | Admitting: Infectious Disease

## 2009-07-27 DIAGNOSIS — B9689 Other specified bacterial agents as the cause of diseases classified elsewhere: Secondary | ICD-10-CM | POA: Insufficient documentation

## 2009-07-27 DIAGNOSIS — M869 Osteomyelitis, unspecified: Secondary | ICD-10-CM | POA: Insufficient documentation

## 2009-07-27 DIAGNOSIS — L03019 Cellulitis of unspecified finger: Secondary | ICD-10-CM

## 2009-07-27 DIAGNOSIS — L02519 Cutaneous abscess of unspecified hand: Secondary | ICD-10-CM | POA: Insufficient documentation

## 2009-07-27 DIAGNOSIS — B952 Enterococcus as the cause of diseases classified elsewhere: Secondary | ICD-10-CM | POA: Insufficient documentation

## 2009-07-29 ENCOUNTER — Emergency Department (HOSPITAL_COMMUNITY)
Admission: EM | Admit: 2009-07-29 | Discharge: 2009-07-29 | Payer: Self-pay | Source: Home / Self Care | Admitting: Emergency Medicine

## 2009-07-31 ENCOUNTER — Encounter: Payer: Self-pay | Admitting: Internal Medicine

## 2009-08-01 ENCOUNTER — Encounter: Payer: Self-pay | Admitting: Infectious Disease

## 2009-08-03 ENCOUNTER — Encounter: Payer: Self-pay | Admitting: Internal Medicine

## 2009-08-04 ENCOUNTER — Encounter: Payer: Self-pay | Admitting: Internal Medicine

## 2009-08-05 ENCOUNTER — Inpatient Hospital Stay (HOSPITAL_COMMUNITY)
Admission: EM | Admit: 2009-08-05 | Discharge: 2009-08-08 | Payer: Self-pay | Source: Home / Self Care | Admitting: Internal Medicine

## 2009-08-07 LAB — CONVERTED CEMR LAB
Cholesterol: 239 mg/dL
HDL: 41 mg/dL
Hgb A1c MFr Bld: 19.8 %
Triglycerides: 518 mg/dL

## 2009-08-09 ENCOUNTER — Emergency Department (HOSPITAL_COMMUNITY)
Admission: EM | Admit: 2009-08-09 | Discharge: 2009-08-09 | Payer: Self-pay | Source: Home / Self Care | Admitting: Emergency Medicine

## 2009-08-09 ENCOUNTER — Telehealth: Payer: Self-pay | Admitting: Internal Medicine

## 2009-08-10 ENCOUNTER — Emergency Department (HOSPITAL_COMMUNITY)
Admission: EM | Admit: 2009-08-10 | Discharge: 2009-08-10 | Payer: Self-pay | Source: Home / Self Care | Admitting: Family Medicine

## 2009-08-11 ENCOUNTER — Telehealth: Payer: Self-pay | Admitting: Internal Medicine

## 2009-08-15 ENCOUNTER — Encounter: Payer: Self-pay | Admitting: Infectious Disease

## 2009-08-15 ENCOUNTER — Encounter: Payer: Self-pay | Admitting: Internal Medicine

## 2009-08-15 ENCOUNTER — Telehealth: Payer: Self-pay | Admitting: Internal Medicine

## 2009-08-16 ENCOUNTER — Ambulatory Visit: Payer: Self-pay | Admitting: Internal Medicine

## 2009-08-16 LAB — CONVERTED CEMR LAB
Chlamydia, Swab/Urine, PCR: NEGATIVE
GC Probe Amp, Urine: NEGATIVE

## 2009-08-21 ENCOUNTER — Encounter: Payer: Self-pay | Admitting: Infectious Disease

## 2009-08-21 ENCOUNTER — Telehealth: Payer: Self-pay | Admitting: Infectious Disease

## 2009-08-22 ENCOUNTER — Encounter: Payer: Self-pay | Admitting: Internal Medicine

## 2009-08-22 ENCOUNTER — Emergency Department (HOSPITAL_COMMUNITY)
Admission: EM | Admit: 2009-08-22 | Discharge: 2009-08-22 | Payer: Self-pay | Source: Home / Self Care | Admitting: Emergency Medicine

## 2009-08-28 ENCOUNTER — Encounter: Payer: Self-pay | Admitting: Internal Medicine

## 2009-08-29 ENCOUNTER — Emergency Department (HOSPITAL_COMMUNITY)
Admission: EM | Admit: 2009-08-29 | Discharge: 2009-08-29 | Payer: Self-pay | Source: Home / Self Care | Admitting: Emergency Medicine

## 2009-09-05 ENCOUNTER — Telehealth: Payer: Self-pay | Admitting: Internal Medicine

## 2009-09-06 ENCOUNTER — Telehealth: Payer: Self-pay | Admitting: Internal Medicine

## 2009-09-07 ENCOUNTER — Observation Stay (HOSPITAL_COMMUNITY)
Admission: EM | Admit: 2009-09-07 | Discharge: 2009-09-08 | Payer: Self-pay | Source: Home / Self Care | Admitting: Emergency Medicine

## 2009-09-10 ENCOUNTER — Inpatient Hospital Stay (HOSPITAL_COMMUNITY)
Admission: EM | Admit: 2009-09-10 | Discharge: 2009-09-13 | Payer: Self-pay | Source: Home / Self Care | Admitting: Emergency Medicine

## 2009-09-15 ENCOUNTER — Telehealth: Payer: Self-pay | Admitting: Internal Medicine

## 2009-09-18 ENCOUNTER — Telehealth: Payer: Self-pay | Admitting: Internal Medicine

## 2009-09-18 ENCOUNTER — Observation Stay (HOSPITAL_COMMUNITY)
Admission: EM | Admit: 2009-09-18 | Discharge: 2009-09-18 | Payer: Self-pay | Source: Home / Self Care | Admitting: Emergency Medicine

## 2009-09-20 ENCOUNTER — Encounter: Payer: Self-pay | Admitting: Infectious Disease

## 2009-09-20 ENCOUNTER — Ambulatory Visit: Payer: Self-pay | Admitting: Internal Medicine

## 2009-09-22 ENCOUNTER — Emergency Department (HOSPITAL_COMMUNITY)
Admission: EM | Admit: 2009-09-22 | Discharge: 2009-09-22 | Payer: Self-pay | Source: Home / Self Care | Admitting: Emergency Medicine

## 2009-10-04 ENCOUNTER — Emergency Department (HOSPITAL_COMMUNITY)
Admission: EM | Admit: 2009-10-04 | Discharge: 2009-10-04 | Payer: Self-pay | Source: Home / Self Care | Admitting: Emergency Medicine

## 2009-10-10 ENCOUNTER — Observation Stay (HOSPITAL_COMMUNITY)
Admission: EM | Admit: 2009-10-10 | Discharge: 2009-10-10 | Payer: Self-pay | Source: Home / Self Care | Admitting: Emergency Medicine

## 2009-10-20 ENCOUNTER — Telehealth: Payer: Self-pay | Admitting: Internal Medicine

## 2009-10-21 IMAGING — CT CT EXTREM LOW W/O CM*R*
3 of 4 series · 15 of 33 positions shown, 19 images · non-contrast
Comparison: Right knee radiographs 11/24/2008

CLINICAL DATA: Injured knee playing basketball.

CT RIGHT KNEE WITHOUT CONTRAST
TECHNIQUE: Multidetector CT imaging of the right knee was
performed according to the standard protocol without intravenous
contrast. Multiplanar CT image reconstructions were also generated.

[Series 3: lower ext · axial · 0.40mm/px · z∈[-192,-30]mm · 9 of 77 slices shown, 12 images]
[im 6/77  soft-tissue]
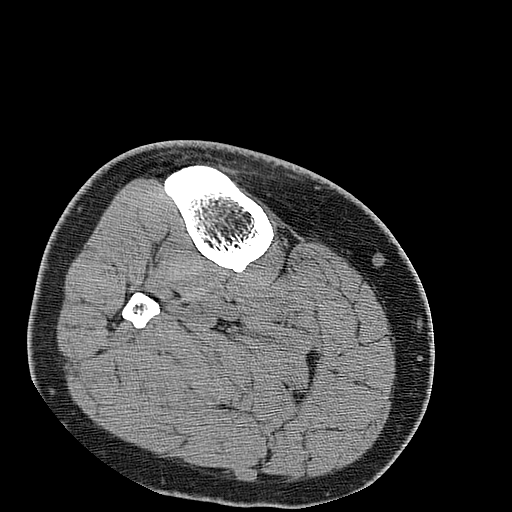
[im 6/77  bone]
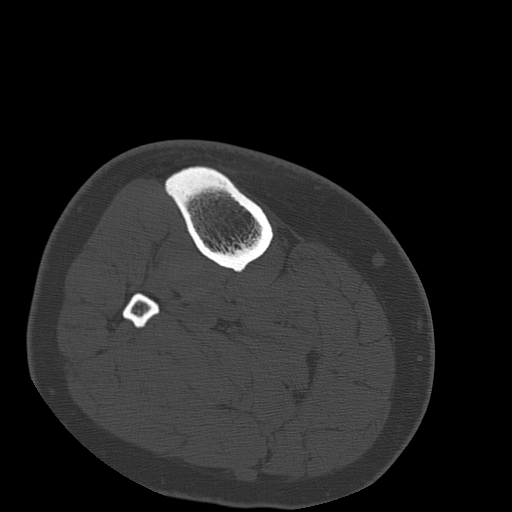
[im 18/77  bone]
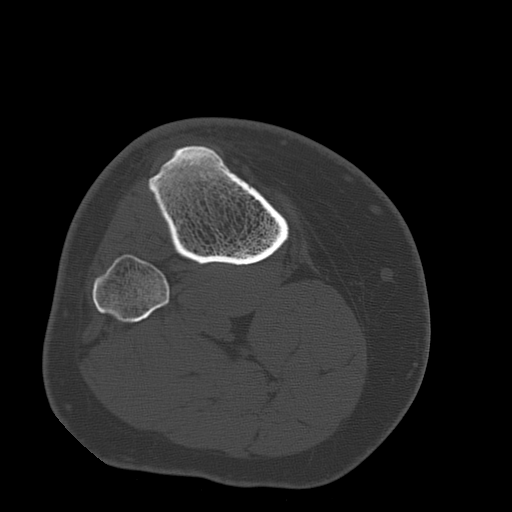
[im 24/77  bone]
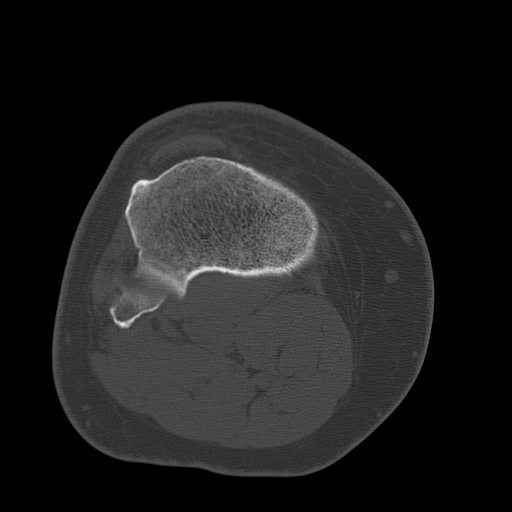
[im 30/77  bone]
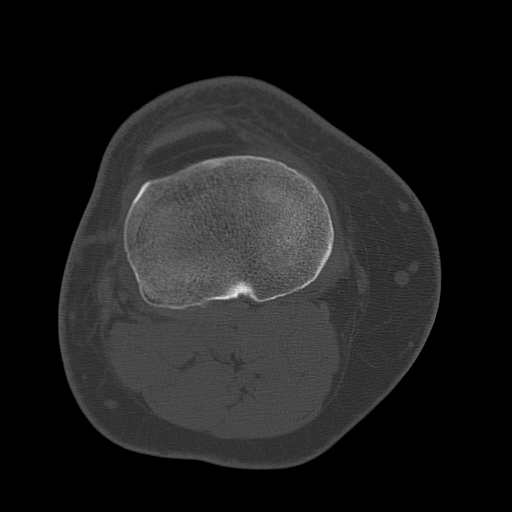
[im 41/77  soft-tissue]
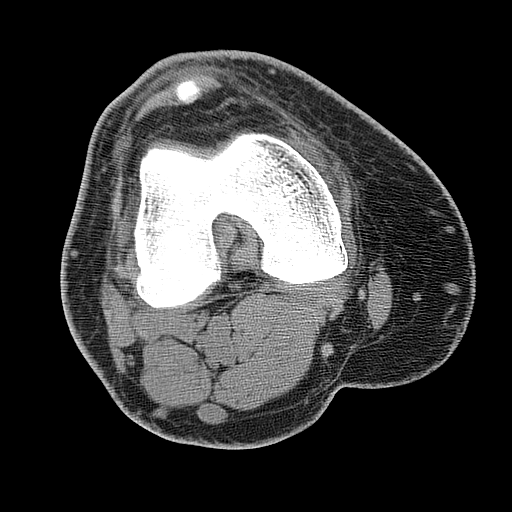
[im 41/77  bone]
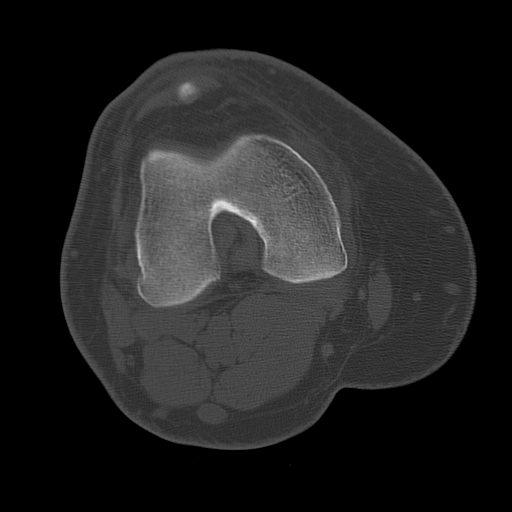
[im 47/77  bone]
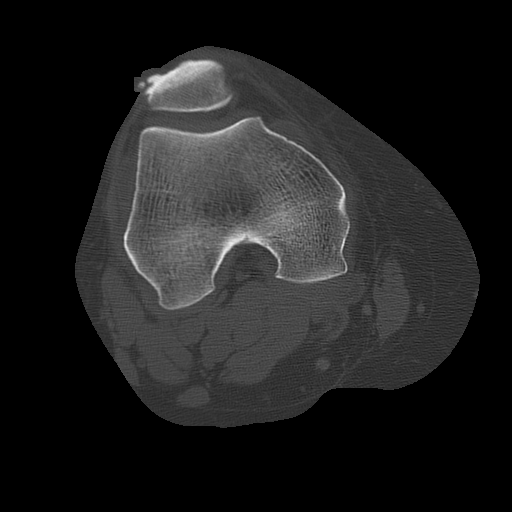
[im 53/77  bone]
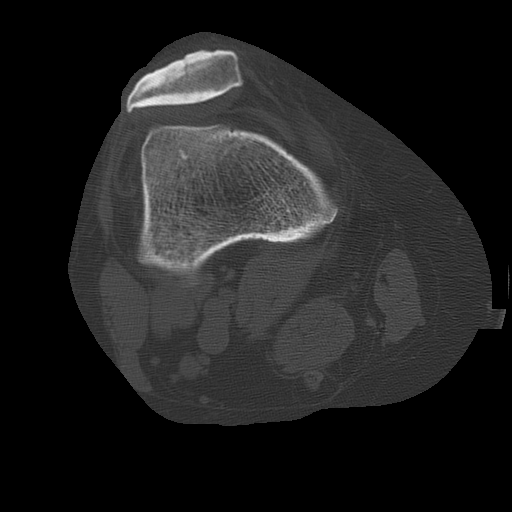
[im 65/77  bone]
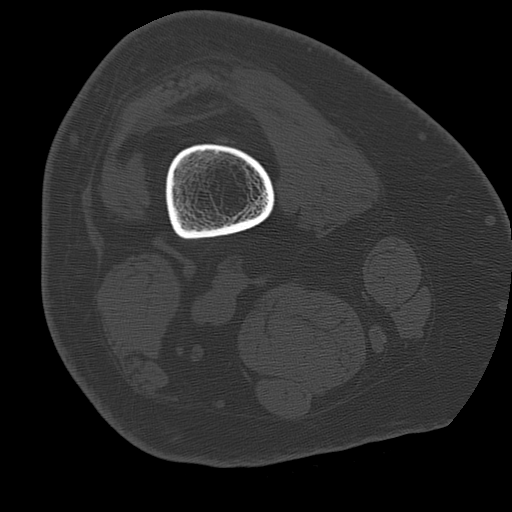
[im 71/77  soft-tissue]
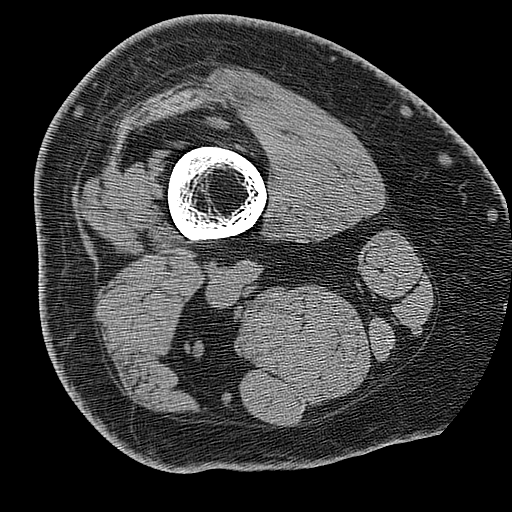
[im 71/77  bone]
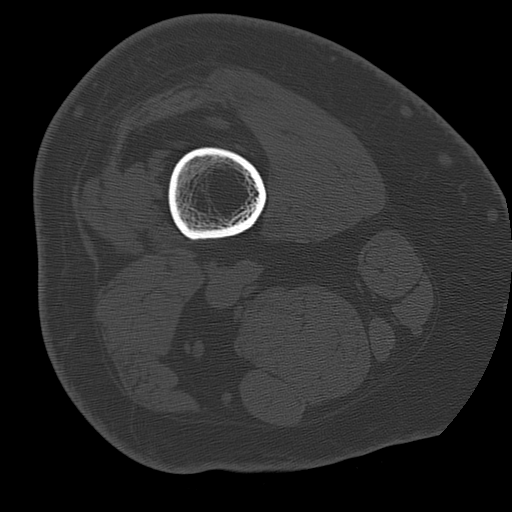

[Series 401: rt coronals · coronal · 0.40mm/px · 1 of 97 slices shown]
[im 49/97  bone]
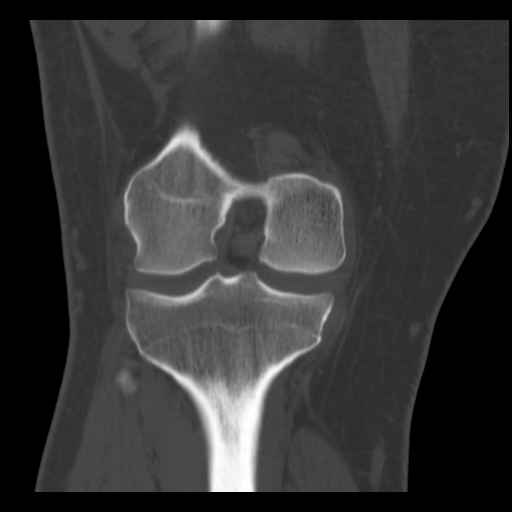

[Series 500: rt sagittals · sagittal · 0.40mm/px · 5 of 69 slices shown, 6 images]
[im 23/69  bone]
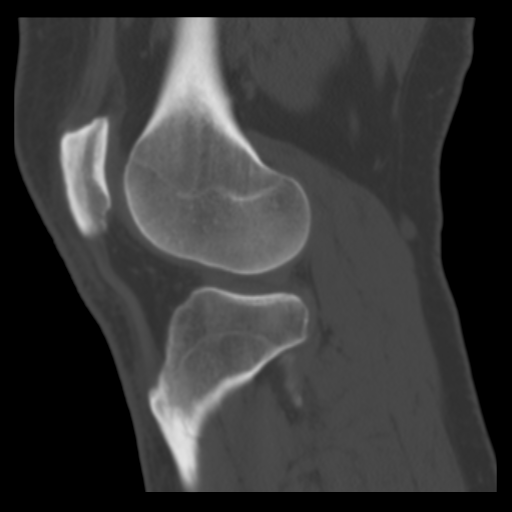
[im 29/69  bone]
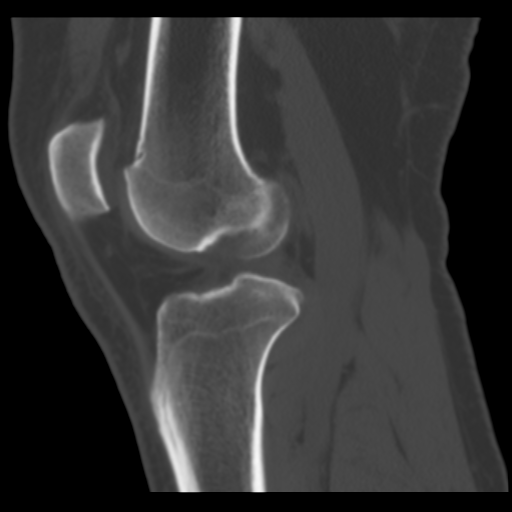
[im 35/69  soft-tissue]
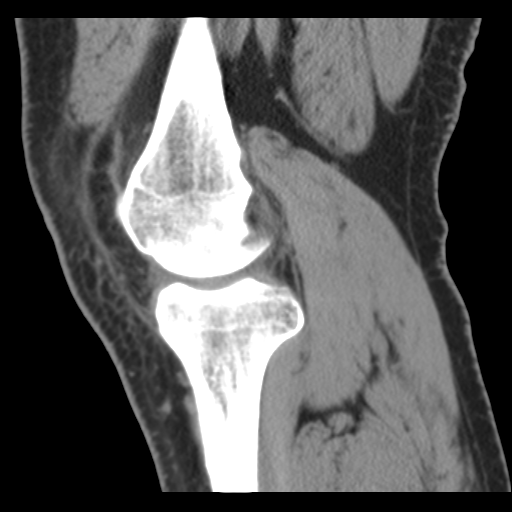
[im 35/69  bone]
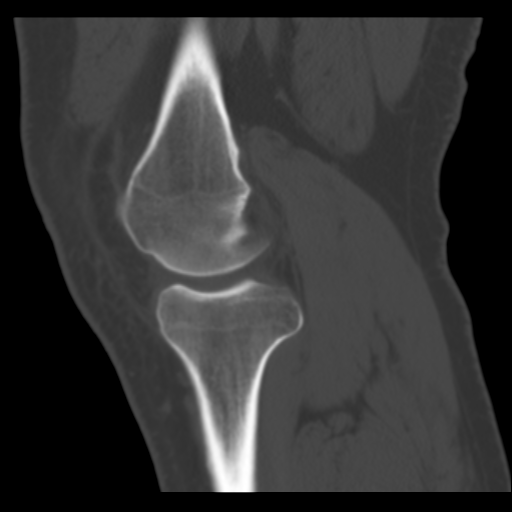
[im 40/69  bone]
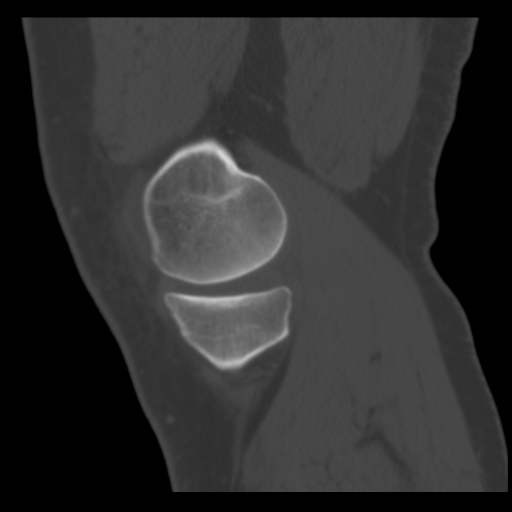
[im 46/69  bone]
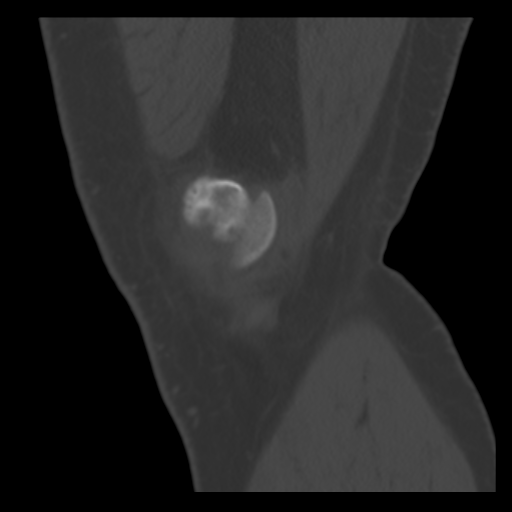

[15 of 33 positions shown; findings below may reference images not displayed]

FINDINGS: No acute fracture.  There is a small cortical defect
involving the lateral tibial plateau.  Posteriorly.  This may be
due to remote trauma.  No significant degenerative changes.  No
joint effusion.  The anterior and posterior cruciate ligaments are
intact.  The quadriceps and patellar tendons are intact.
IMPRESSION: 1.  No acute fracture.
2.  Probable remote post-traumatic changes involving the lateral
tibial plateau posteriorly.
3.  No joint effusion.
4.  Intact anterior and posterior cruciate ligaments.

## 2009-10-21 IMAGING — CR DG KNEE COMPLETE 4+V*R*
4 series · 4 of 4 positions shown · non-contrast
Comparison: None

CLINICAL DATA: Chronic knee pain, no acute injury

RIGHT KNEE - COMPLETE 4+ VIEW

[t knee ap right]
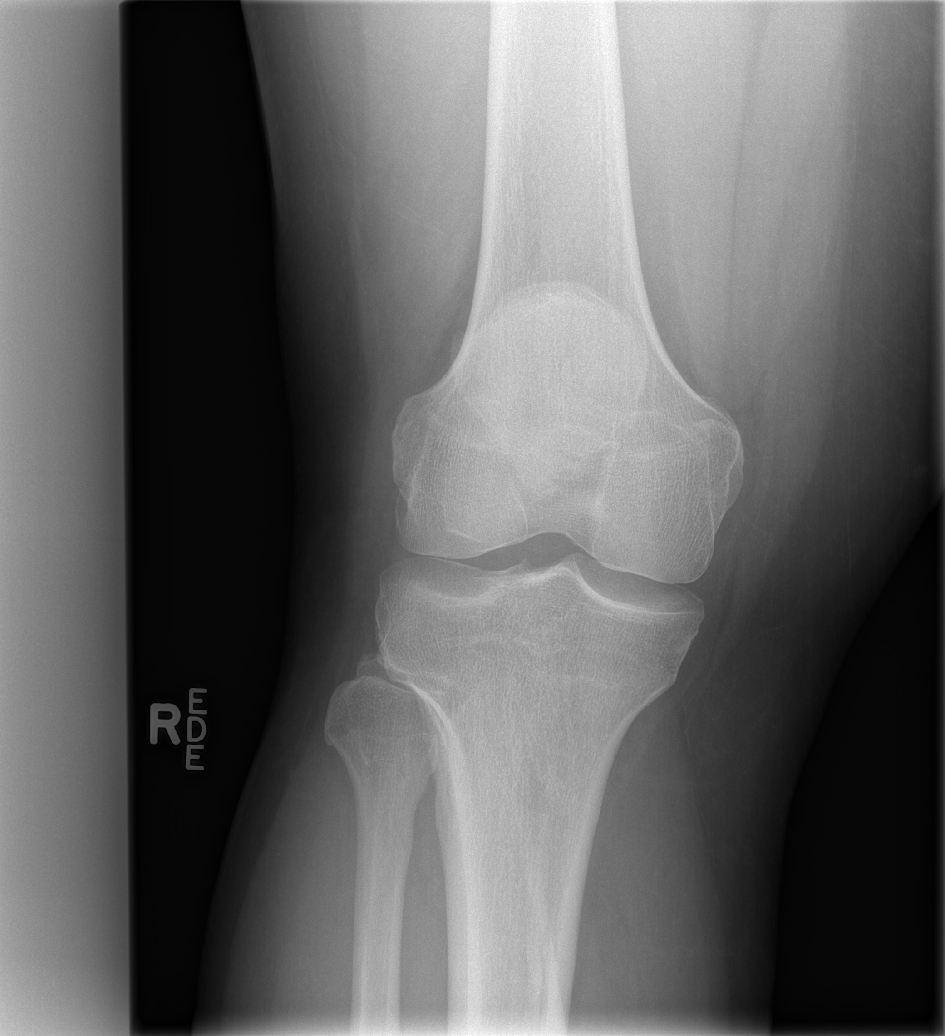

[t knee oblique right (1 of 2)]
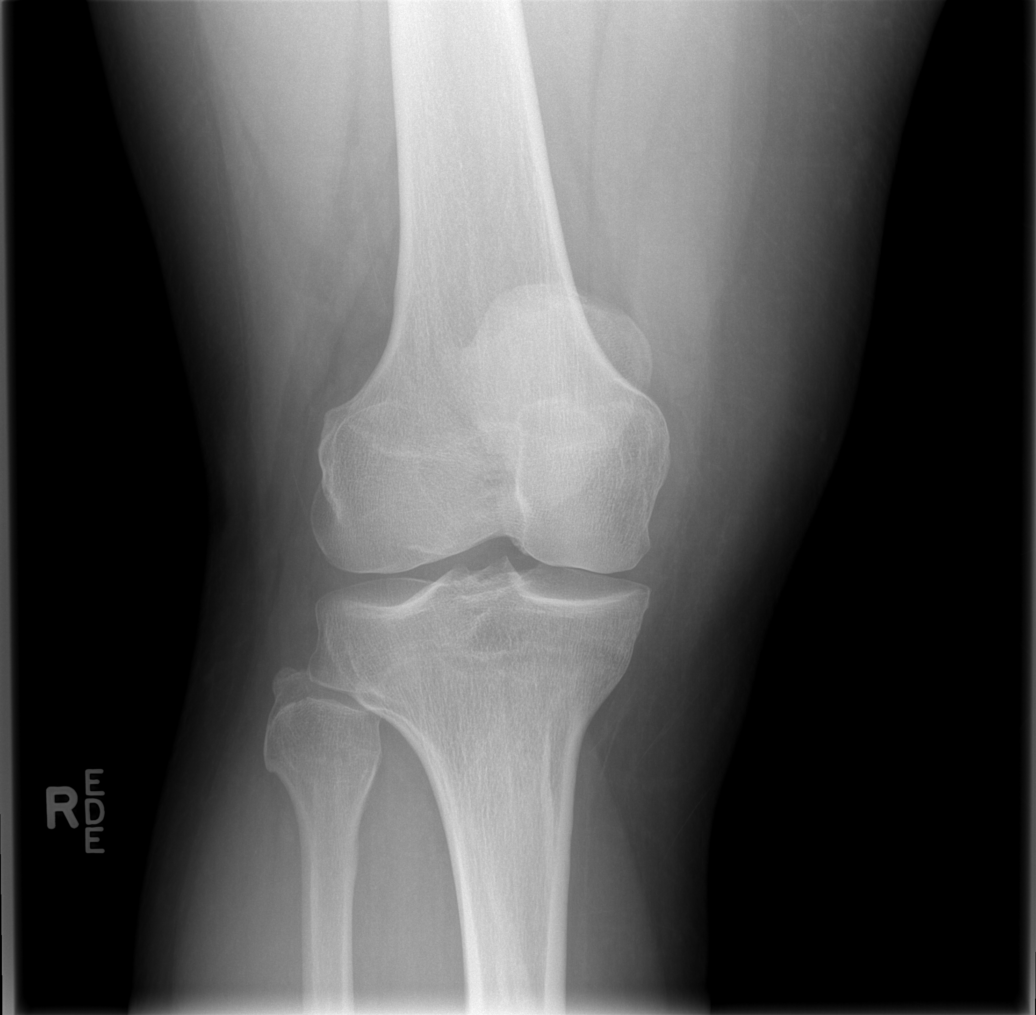

[t knee oblique right (2 of 2)]
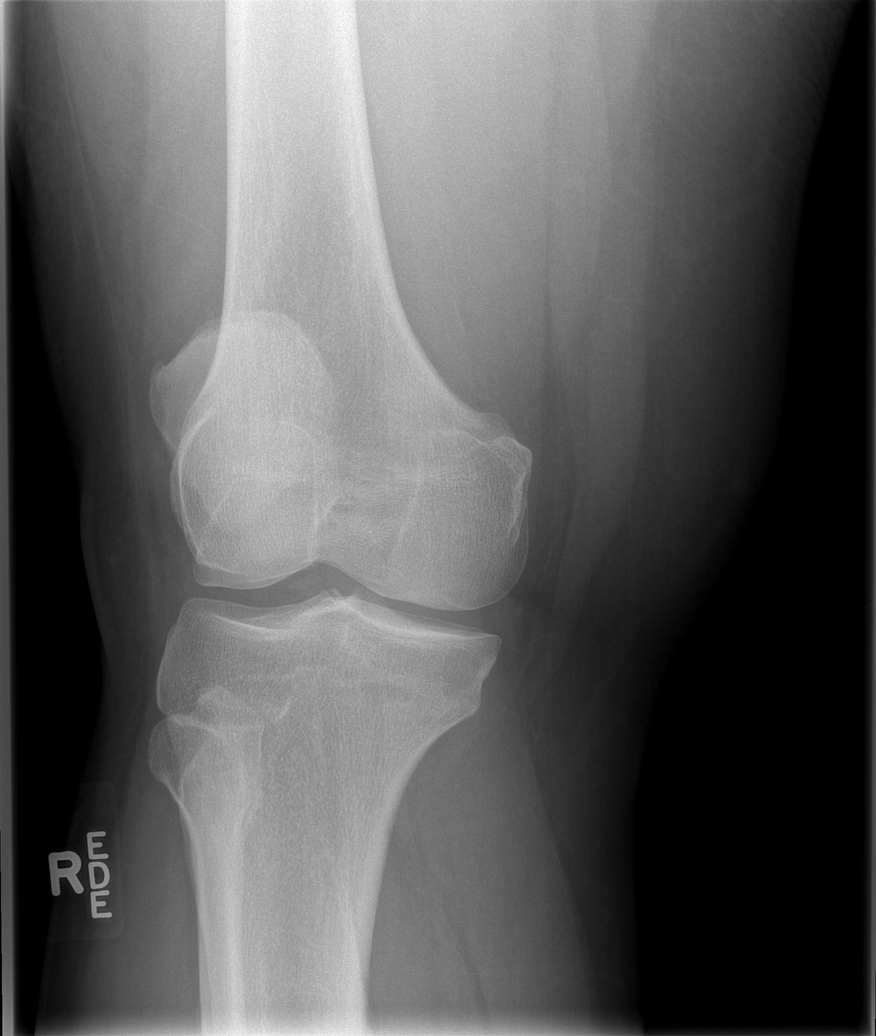

[t knee lat right]
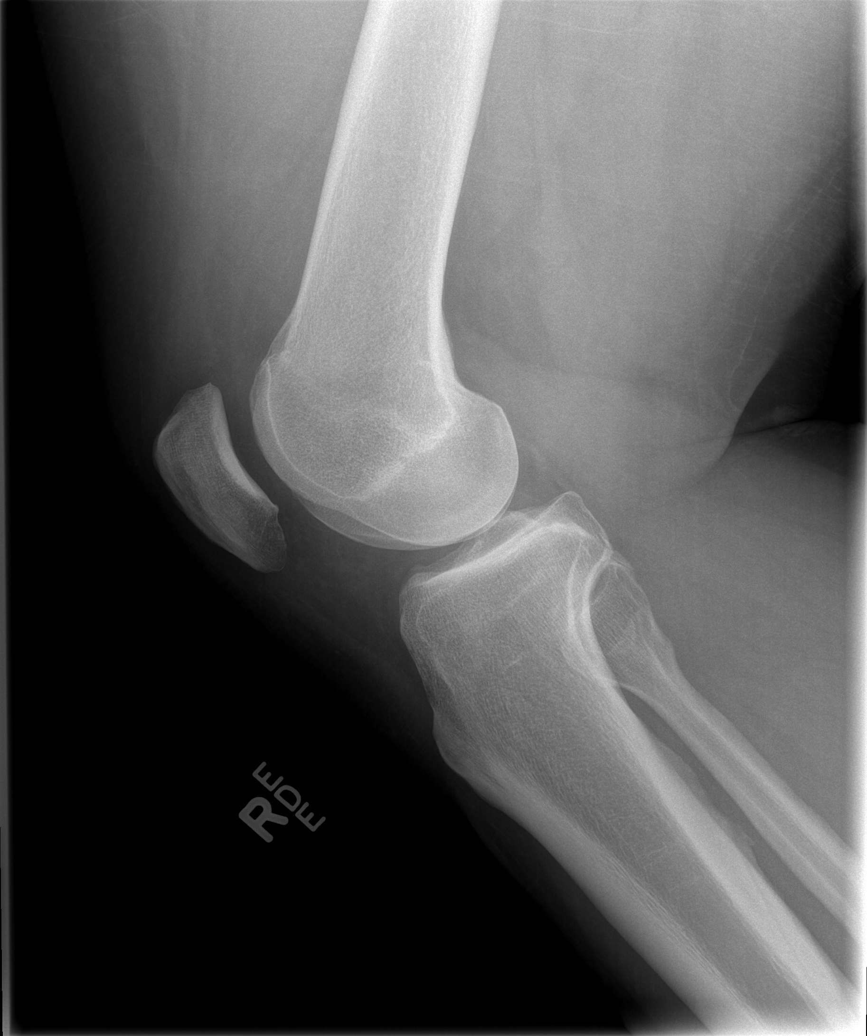

[4 of 4 positions shown; findings below may reference images not displayed]

FINDINGS: No acute fracture is seen.  The joint spaces appear
normal.  No effusion is seen.
IMPRESSION: Negative right knee.

## 2009-10-22 ENCOUNTER — Emergency Department (HOSPITAL_COMMUNITY)
Admission: EM | Admit: 2009-10-22 | Discharge: 2009-10-22 | Payer: Self-pay | Source: Home / Self Care | Admitting: Emergency Medicine

## 2009-10-24 ENCOUNTER — Observation Stay (HOSPITAL_COMMUNITY)
Admission: EM | Admit: 2009-10-24 | Discharge: 2009-10-25 | Payer: Self-pay | Source: Home / Self Care | Admitting: Emergency Medicine

## 2009-10-25 ENCOUNTER — Telehealth (INDEPENDENT_AMBULATORY_CARE_PROVIDER_SITE_OTHER): Payer: Self-pay | Admitting: *Deleted

## 2009-10-27 ENCOUNTER — Ambulatory Visit: Payer: Self-pay | Admitting: Internal Medicine

## 2009-10-27 ENCOUNTER — Telehealth: Payer: Self-pay | Admitting: Internal Medicine

## 2009-10-27 ENCOUNTER — Emergency Department (HOSPITAL_COMMUNITY)
Admission: EM | Admit: 2009-10-27 | Discharge: 2009-10-27 | Payer: Self-pay | Source: Home / Self Care | Admitting: Emergency Medicine

## 2009-10-27 LAB — CONVERTED CEMR LAB
ALT: 13 U/L
AST: 14 U/L
Albumin: 4.2 g/dL
Alkaline Phosphatase: 114 U/L
BUN: 11 mg/dL
Basophils Absolute: 0 10*3/uL
Basophils Relative: 1 %
CO2: 20 meq/L
Calcium: 10 mg/dL
Chloride: 81 meq/L — ABNORMAL LOW
Creatinine, Ser: 1.1 mg/dL
Eosinophils Absolute: 0 10*3/uL
Eosinophils Relative: 1 %
Glucose, Bld: 952 mg/dL
HCT: 34.2 % — ABNORMAL LOW
HIV 1 RNA Quant: 8910 copies/mL — ABNORMAL HIGH (ref <48)
HIV-1 RNA Quant, Log: 3.95 — ABNORMAL HIGH (ref <1.68)
Hemoglobin: 11.5 g/dL — ABNORMAL LOW
Lymphocytes Relative: 32 %
Lymphs Abs: 1.4 10*3/uL
MCHC: 33.6 g/dL
MCV: 80.1 fL
Monocytes Absolute: 0.4 10*3/uL
Monocytes Relative: 8 %
Neutro Abs: 2.5 10*3/uL
Neutrophils Relative %: 59 %
Platelets: 168 10*3/uL
Potassium: 4.4 meq/L
RBC: 4.27 M/uL
RDW: 13.8 %
Sodium: 119 meq/L — ABNORMAL LOW
Total Bilirubin: 0.4 mg/dL
Total Protein: 8 g/dL
WBC: 4.3 10*3/uL

## 2009-10-29 ENCOUNTER — Emergency Department (HOSPITAL_COMMUNITY)
Admission: EM | Admit: 2009-10-29 | Discharge: 2009-10-29 | Payer: Self-pay | Source: Home / Self Care | Admitting: Emergency Medicine

## 2009-10-30 ENCOUNTER — Encounter: Payer: Self-pay | Admitting: Internal Medicine

## 2009-10-30 ENCOUNTER — Encounter (INDEPENDENT_AMBULATORY_CARE_PROVIDER_SITE_OTHER): Payer: Self-pay | Admitting: *Deleted

## 2009-10-30 ENCOUNTER — Telehealth (INDEPENDENT_AMBULATORY_CARE_PROVIDER_SITE_OTHER): Payer: Self-pay | Admitting: *Deleted

## 2009-10-31 ENCOUNTER — Encounter (INDEPENDENT_AMBULATORY_CARE_PROVIDER_SITE_OTHER): Payer: Self-pay | Admitting: *Deleted

## 2009-11-01 ENCOUNTER — Encounter (INDEPENDENT_AMBULATORY_CARE_PROVIDER_SITE_OTHER): Payer: Self-pay | Admitting: *Deleted

## 2009-11-01 ENCOUNTER — Observation Stay (HOSPITAL_COMMUNITY)
Admission: EM | Admit: 2009-11-01 | Discharge: 2009-11-01 | Payer: Self-pay | Source: Home / Self Care | Admitting: Emergency Medicine

## 2009-11-01 ENCOUNTER — Encounter: Payer: Self-pay | Admitting: Internal Medicine

## 2009-11-02 ENCOUNTER — Encounter (INDEPENDENT_AMBULATORY_CARE_PROVIDER_SITE_OTHER): Payer: Self-pay | Admitting: *Deleted

## 2009-11-03 ENCOUNTER — Ambulatory Visit: Payer: Self-pay | Admitting: Internal Medicine

## 2009-11-03 ENCOUNTER — Encounter (INDEPENDENT_AMBULATORY_CARE_PROVIDER_SITE_OTHER): Payer: Self-pay | Admitting: *Deleted

## 2009-11-03 LAB — CONVERTED CEMR LAB: Blood Glucose, Home Monitor: 2 mg/dL

## 2009-11-06 ENCOUNTER — Telehealth: Payer: Self-pay | Admitting: Internal Medicine

## 2009-11-06 ENCOUNTER — Encounter (INDEPENDENT_AMBULATORY_CARE_PROVIDER_SITE_OTHER): Payer: Self-pay | Admitting: *Deleted

## 2009-11-07 ENCOUNTER — Encounter (INDEPENDENT_AMBULATORY_CARE_PROVIDER_SITE_OTHER): Payer: Self-pay | Admitting: *Deleted

## 2009-11-09 ENCOUNTER — Encounter (INDEPENDENT_AMBULATORY_CARE_PROVIDER_SITE_OTHER): Payer: Self-pay | Admitting: *Deleted

## 2009-11-10 ENCOUNTER — Telehealth: Payer: Self-pay | Admitting: Internal Medicine

## 2009-11-10 ENCOUNTER — Observation Stay (HOSPITAL_COMMUNITY)
Admission: EM | Admit: 2009-11-10 | Discharge: 2009-11-10 | Payer: Self-pay | Source: Home / Self Care | Admitting: Emergency Medicine

## 2009-11-13 ENCOUNTER — Telehealth: Payer: Self-pay | Admitting: Internal Medicine

## 2009-11-14 ENCOUNTER — Inpatient Hospital Stay (HOSPITAL_COMMUNITY)
Admission: EM | Admit: 2009-11-14 | Discharge: 2009-11-19 | Payer: Self-pay | Source: Home / Self Care | Admitting: Emergency Medicine

## 2009-11-14 ENCOUNTER — Encounter (INDEPENDENT_AMBULATORY_CARE_PROVIDER_SITE_OTHER): Payer: Self-pay | Admitting: *Deleted

## 2009-11-15 ENCOUNTER — Ambulatory Visit: Payer: Self-pay | Admitting: Internal Medicine

## 2009-11-15 ENCOUNTER — Encounter (INDEPENDENT_AMBULATORY_CARE_PROVIDER_SITE_OTHER): Payer: Self-pay | Admitting: *Deleted

## 2009-11-15 ENCOUNTER — Telehealth: Payer: Self-pay | Admitting: Internal Medicine

## 2009-11-22 ENCOUNTER — Encounter (INDEPENDENT_AMBULATORY_CARE_PROVIDER_SITE_OTHER): Payer: Self-pay | Admitting: *Deleted

## 2009-12-05 ENCOUNTER — Telehealth: Payer: Self-pay | Admitting: Internal Medicine

## 2009-12-06 ENCOUNTER — Encounter (INDEPENDENT_AMBULATORY_CARE_PROVIDER_SITE_OTHER): Payer: Self-pay | Admitting: *Deleted

## 2009-12-08 ENCOUNTER — Ambulatory Visit: Payer: Self-pay | Admitting: Internal Medicine

## 2009-12-08 LAB — CONVERTED CEMR LAB: Blood Glucose, Fingerstick: 307

## 2009-12-09 ENCOUNTER — Emergency Department (HOSPITAL_COMMUNITY)
Admission: EM | Admit: 2009-12-09 | Discharge: 2009-12-09 | Payer: Self-pay | Source: Home / Self Care | Admitting: Emergency Medicine

## 2009-12-15 ENCOUNTER — Inpatient Hospital Stay (HOSPITAL_COMMUNITY)
Admission: EM | Admit: 2009-12-15 | Discharge: 2009-12-18 | Payer: Self-pay | Source: Home / Self Care | Admitting: Emergency Medicine

## 2009-12-16 LAB — CONVERTED CEMR LAB
Cholesterol: 239 mg/dL
HDL: 47 mg/dL
Hgb A1c MFr Bld: 18.9 %
Triglycerides: 531 mg/dL

## 2009-12-19 ENCOUNTER — Encounter (INDEPENDENT_AMBULATORY_CARE_PROVIDER_SITE_OTHER): Payer: Self-pay | Admitting: *Deleted

## 2009-12-20 ENCOUNTER — Encounter (INDEPENDENT_AMBULATORY_CARE_PROVIDER_SITE_OTHER): Payer: Self-pay | Admitting: *Deleted

## 2009-12-21 ENCOUNTER — Other Ambulatory Visit: Payer: Self-pay | Admitting: Internal Medicine

## 2009-12-21 ENCOUNTER — Ambulatory Visit: Payer: Self-pay | Admitting: Internal Medicine

## 2009-12-21 LAB — CONVERTED CEMR LAB
BUN: 18 mg/dL (ref 6–23)
CO2: 25 meq/L (ref 19–32)
Calcium: 10.1 mg/dL (ref 8.4–10.5)
Chloride: 93 meq/L — ABNORMAL LOW (ref 96–112)
Creatinine, Ser: 1.06 mg/dL (ref 0.40–1.50)
Glucose, Bld: 443 mg/dL — ABNORMAL HIGH (ref 70–99)
HIV 1 RNA Quant: 696 copies/mL — ABNORMAL HIGH (ref <20)
HIV-1 RNA Quant, Log: 2.84 — ABNORMAL HIGH (ref <1.30)
Potassium: 4 meq/L (ref 3.5–5.3)
Sodium: 132 meq/L — ABNORMAL LOW (ref 135–145)

## 2009-12-22 IMAGING — CR DG CHEST 1V PORT
1 series · 1 of 1 positions shown · non-contrast
Comparison: 09/02/2003

CLINICAL DATA: Shortness of breath.

PORTABLE CHEST - 1 VIEW

[view not recorded]
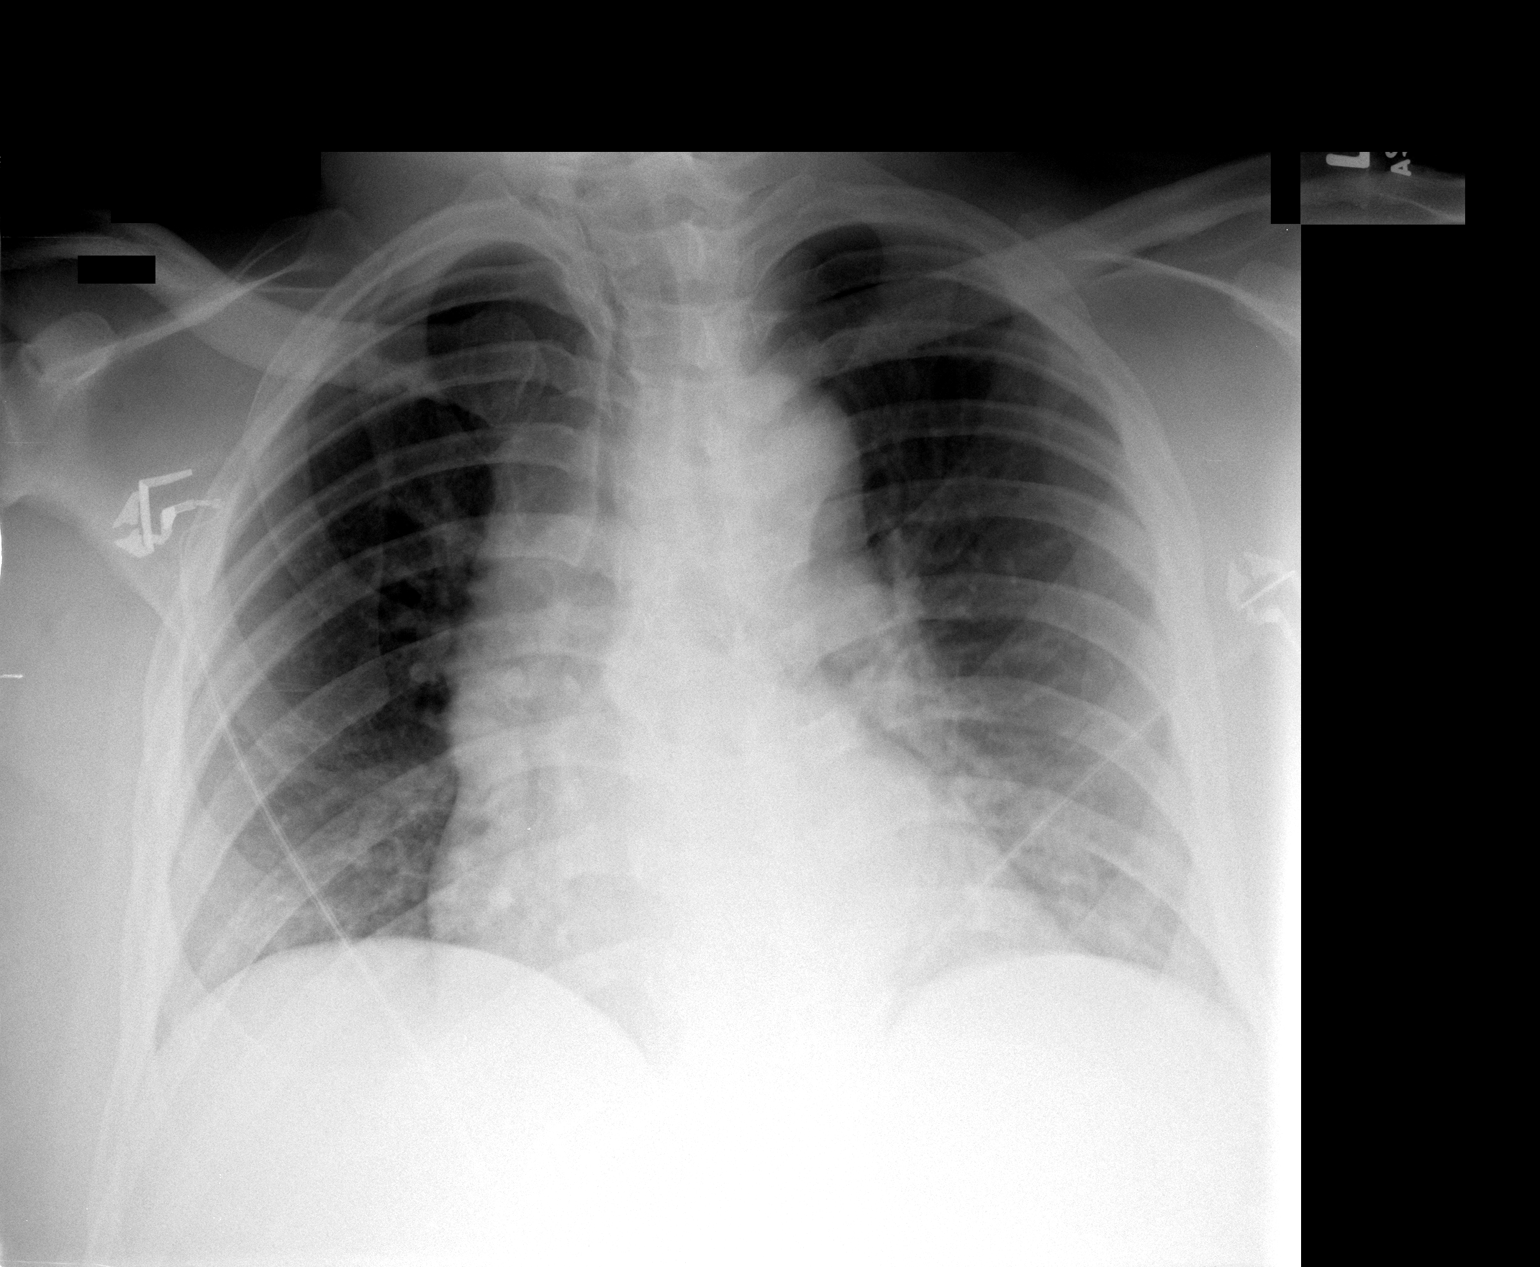

[1 of 1 positions shown; findings below may reference images not displayed]

FINDINGS: Trachea is midline, given slight patient rotation.  Heart
size stable.  Lungs are somewhat low in volume with bibasilar
linear opacities which do not appear significantly changed from
09/02/2003.
IMPRESSION: No acute findings.

## 2009-12-22 IMAGING — CR DG HAND COMPLETE 3+V*L*
3 series · 3 of 3 positions shown · non-contrast
Comparison: None

CLINICAL DATA: Distal second phalanx pain and swelling, following
removal of hangnail.

LEFT HAND - COMPLETE 3+ VIEW

[x hand pa left]
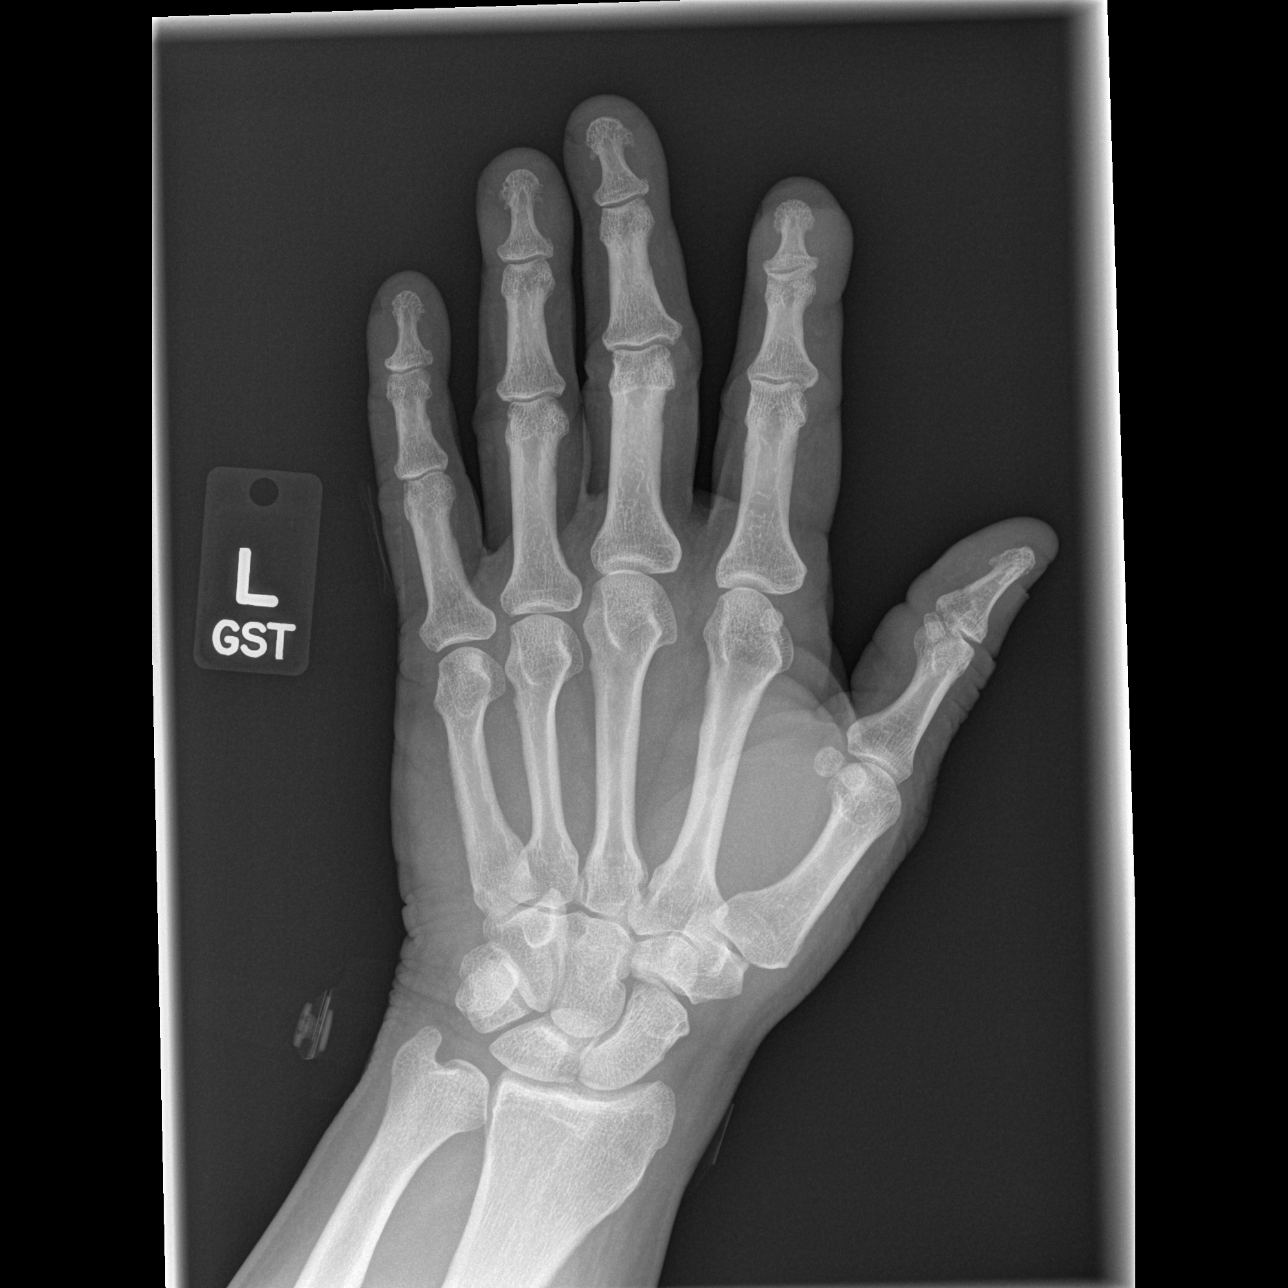

[x hand oblique left]
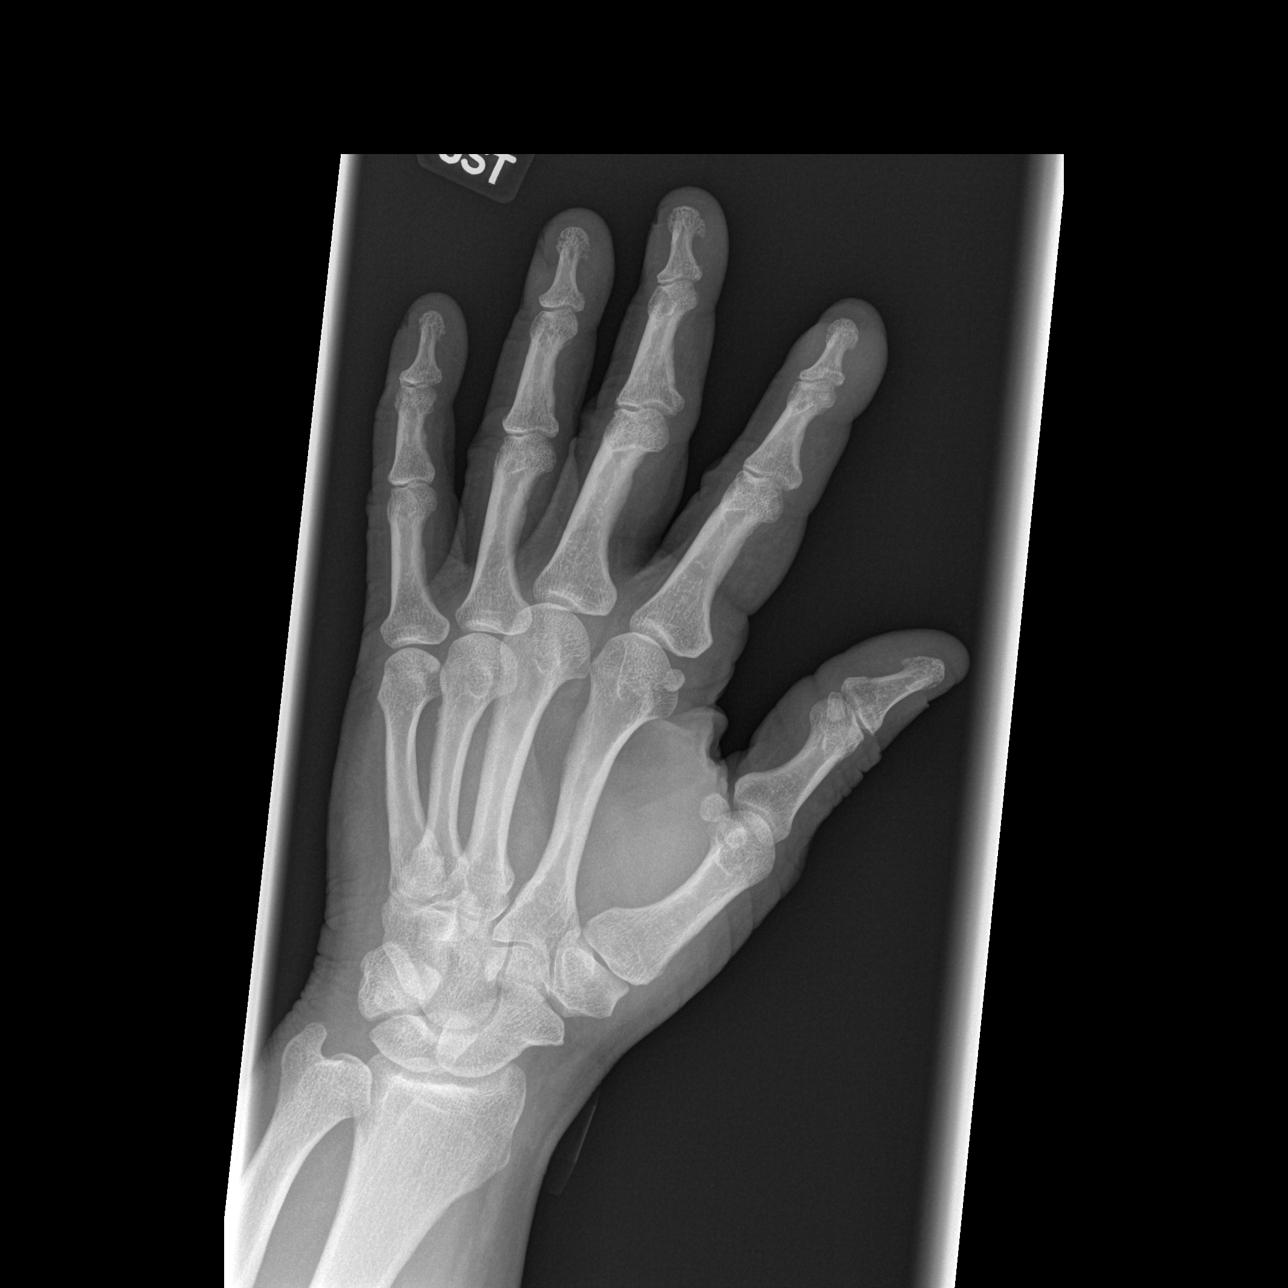

[x hand lat left]
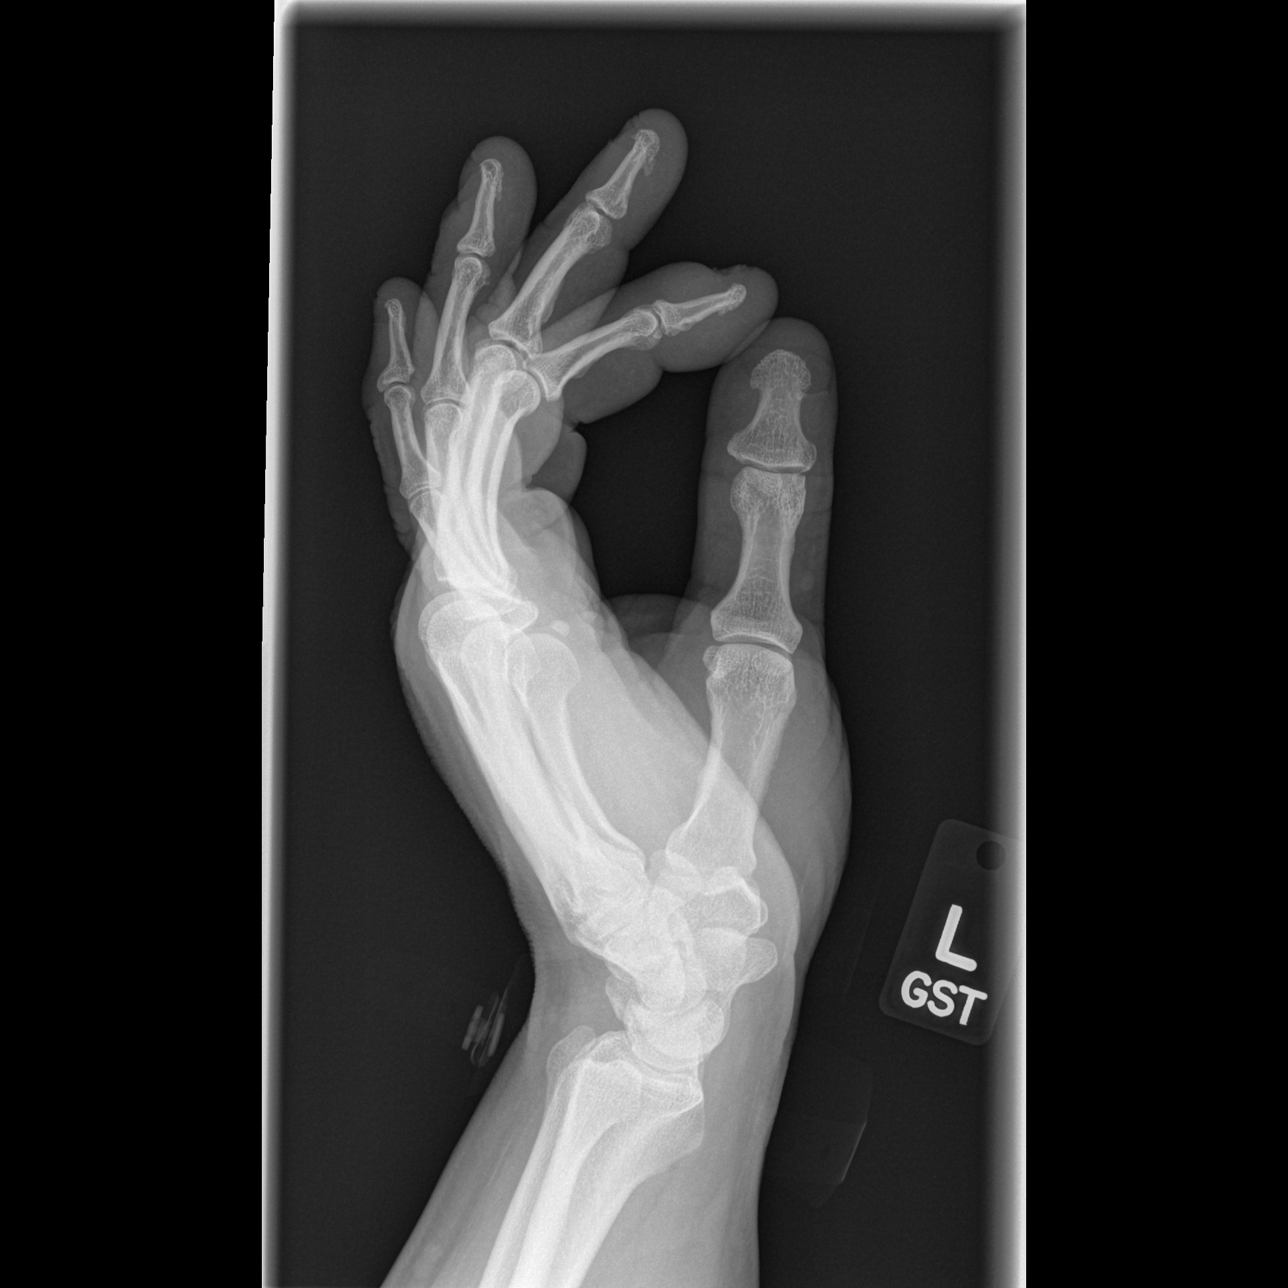

[3 of 3 positions shown; findings below may reference images not displayed]

FINDINGS: There is no evidence of fracture or dislocation.  The
joint spaces are preserved.  The carpal rows appear intact.  There
is focal soft tissue swelling at the distal second finger.  No
additional soft tissue abnormalities are appreciated.
IMPRESSION: No evidence of fracture or dislocation; focal soft tissue swelling
at the distal second finger.

## 2009-12-26 ENCOUNTER — Telehealth (INDEPENDENT_AMBULATORY_CARE_PROVIDER_SITE_OTHER): Payer: Self-pay | Admitting: *Deleted

## 2009-12-29 ENCOUNTER — Telehealth: Payer: Self-pay | Admitting: Internal Medicine

## 2010-01-02 ENCOUNTER — Encounter (INDEPENDENT_AMBULATORY_CARE_PROVIDER_SITE_OTHER): Payer: Self-pay | Admitting: *Deleted

## 2010-01-05 ENCOUNTER — Encounter (INDEPENDENT_AMBULATORY_CARE_PROVIDER_SITE_OTHER): Payer: Self-pay | Admitting: *Deleted

## 2010-01-05 IMAGING — CR DG CHEST 1V PORT
1 series · 1 of 1 positions shown · non-contrast
Comparison: 01/25/2009

CLINICAL DATA: Hyperglycemia.  Hyperosmolar study.

PORTABLE CHEST - 1 VIEW

[view not recorded]
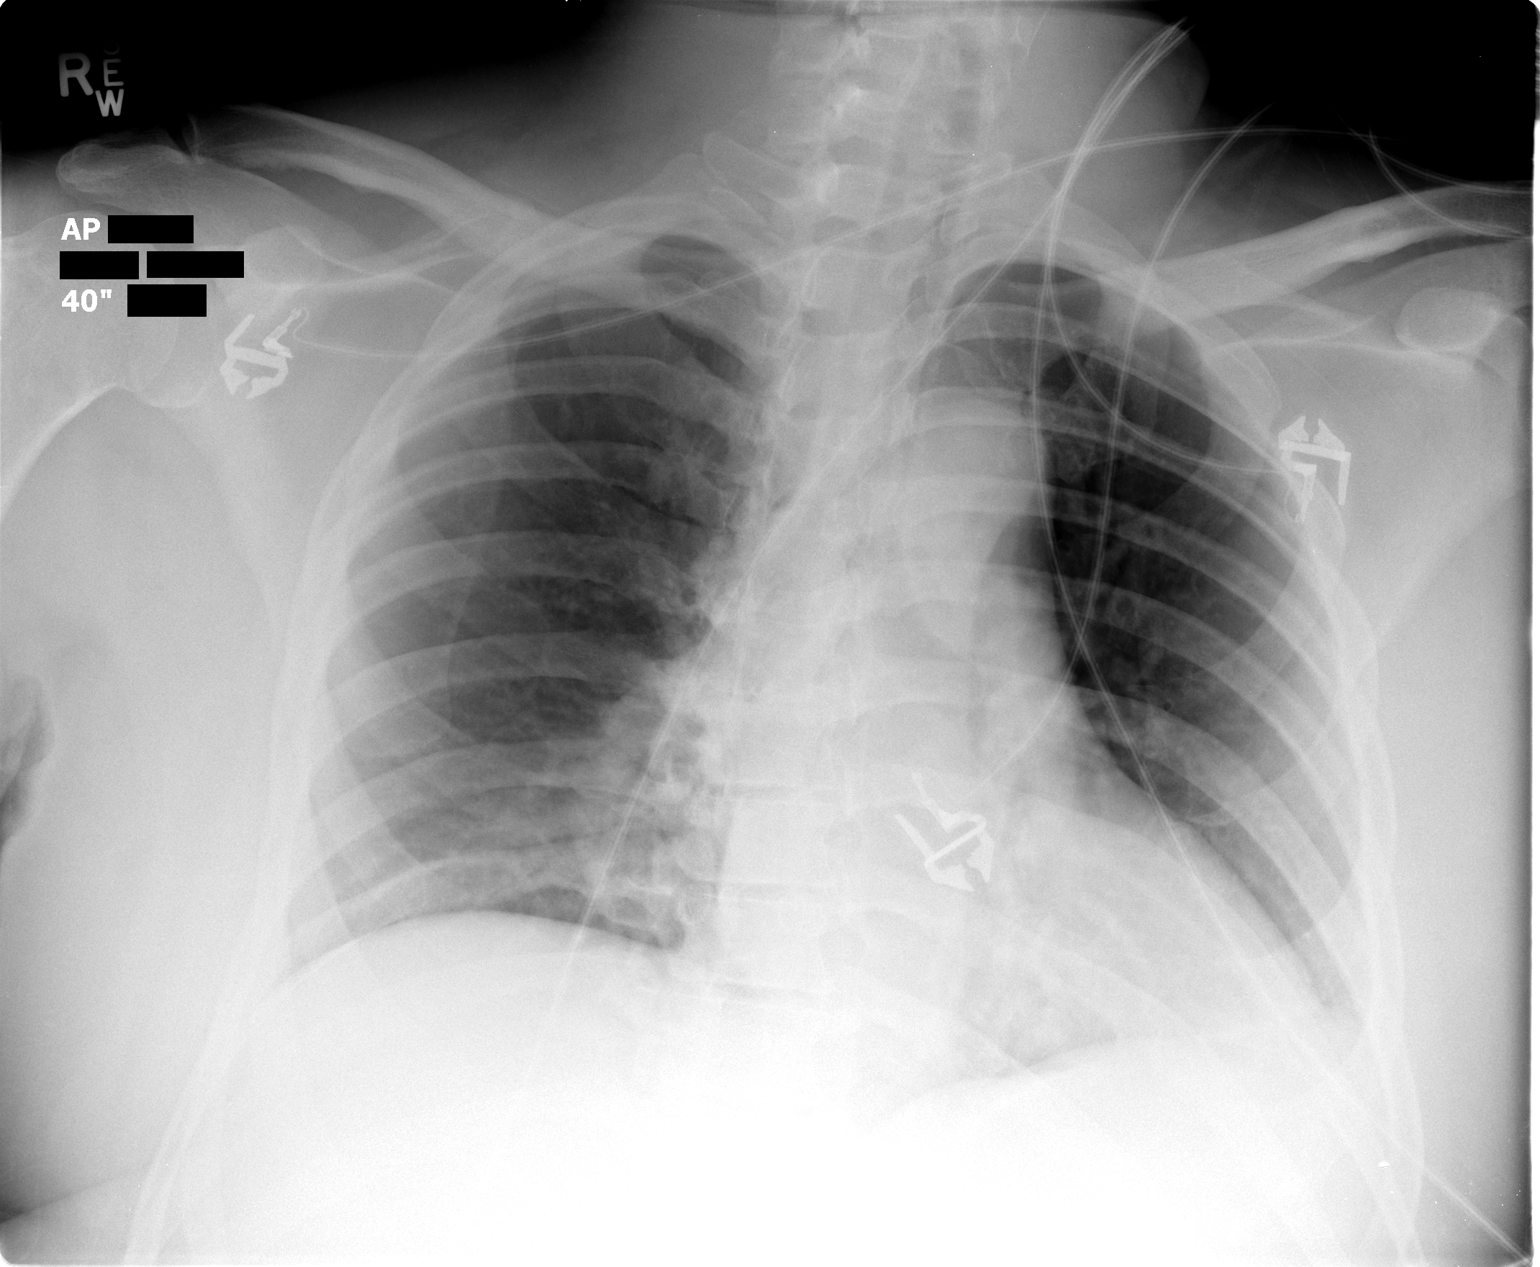

[1 of 1 positions shown; findings below may reference images not displayed]

FINDINGS: Lungs are clear of an active process.  Cardiomediastinal
silhouette unremarkable.
IMPRESSION: No acute chest findings.

## 2010-01-08 ENCOUNTER — Encounter (INDEPENDENT_AMBULATORY_CARE_PROVIDER_SITE_OTHER): Payer: Self-pay | Admitting: *Deleted

## 2010-01-10 ENCOUNTER — Ambulatory Visit: Payer: Self-pay | Admitting: Internal Medicine

## 2010-01-10 ENCOUNTER — Telehealth (INDEPENDENT_AMBULATORY_CARE_PROVIDER_SITE_OTHER): Payer: Self-pay | Admitting: *Deleted

## 2010-01-10 LAB — CONVERTED CEMR LAB: Blood Glucose, Fingerstick: 458

## 2010-01-10 IMAGING — CR DG CHEST 2V
2 series · 2 of 2 positions shown · non-contrast
Comparison: 02/08/2009 and earlier.

CLINICAL DATA: 43-year-old male with hyperglycemia.  Blurred vision
and dizziness.

CHEST - 2 VIEW

[w chest pa]
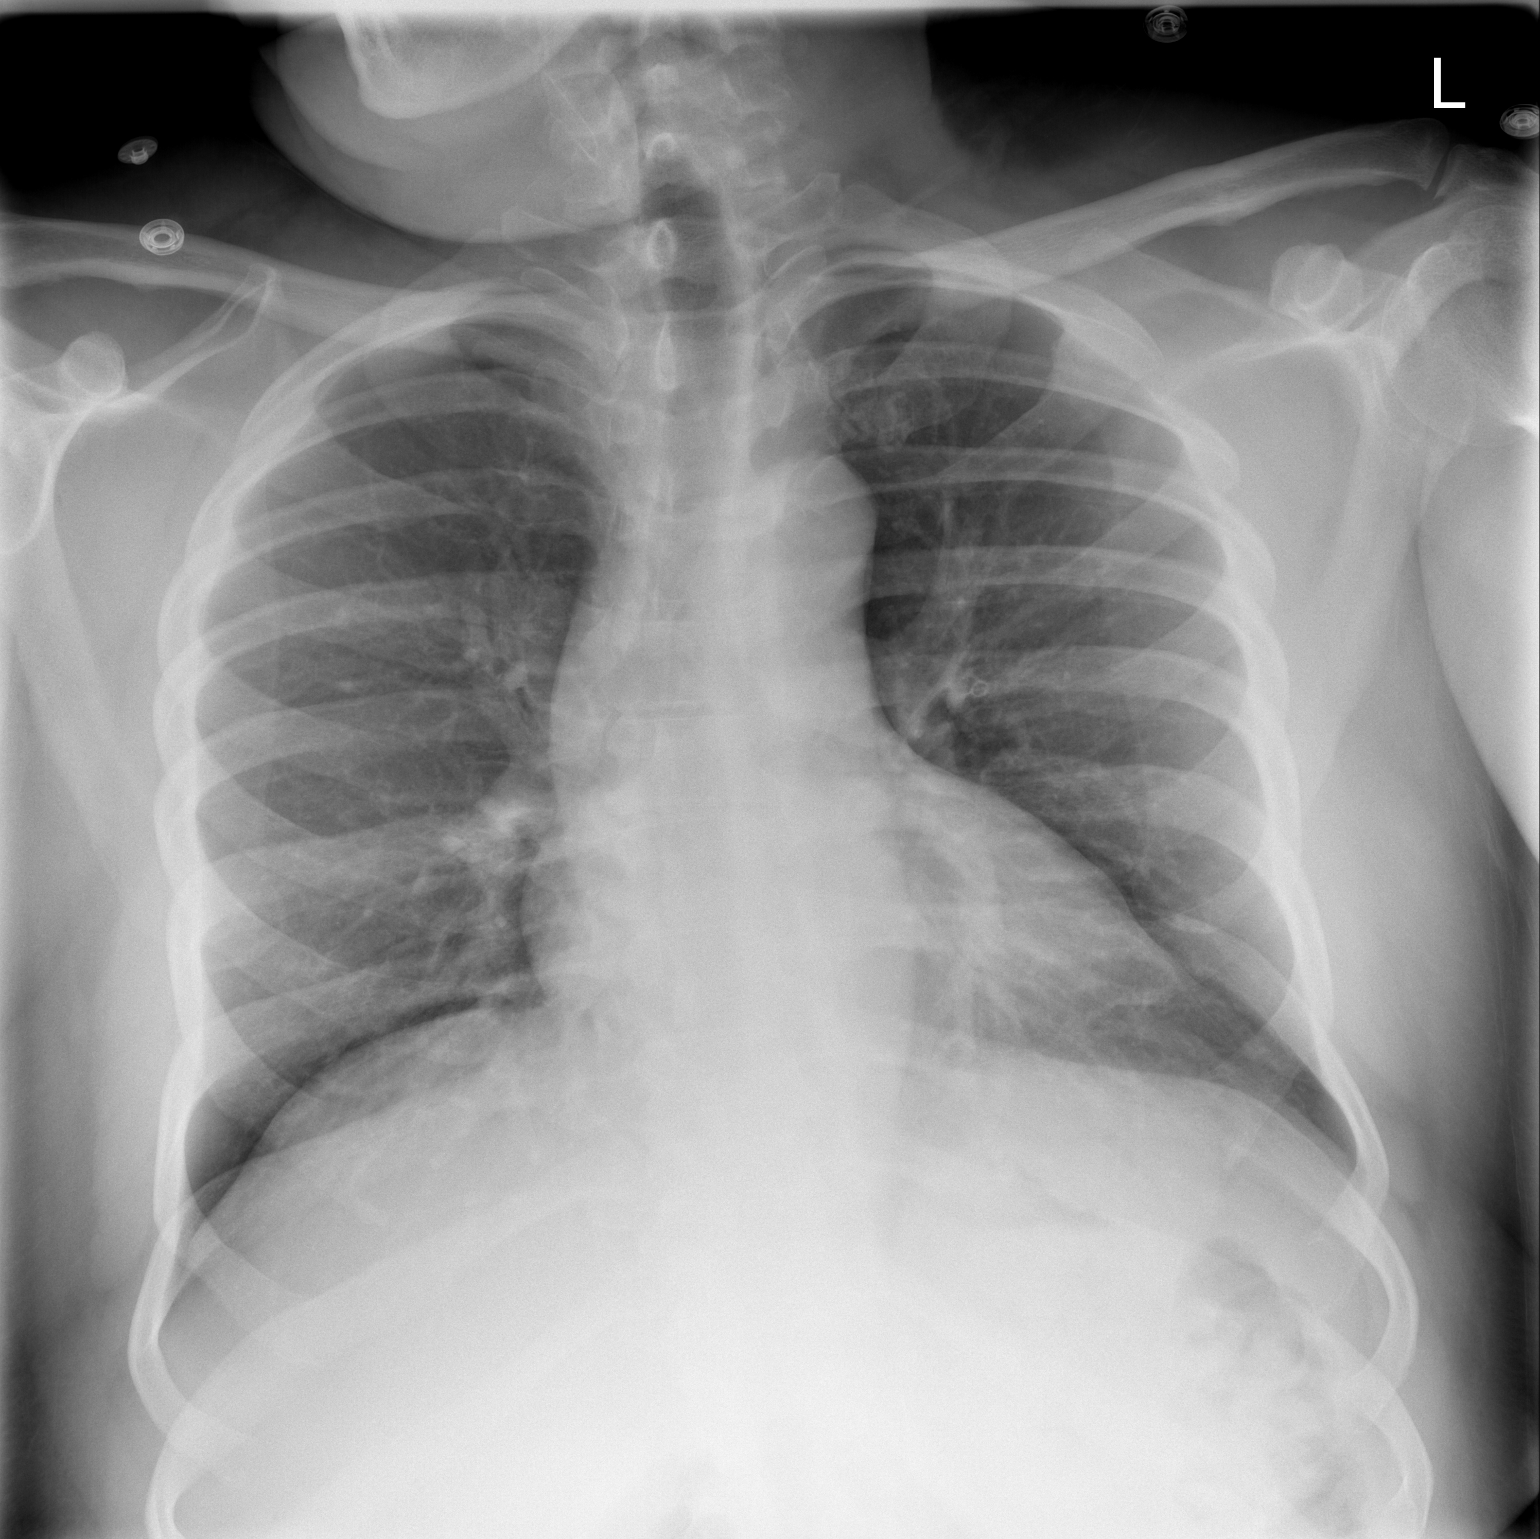

[w chest lat]
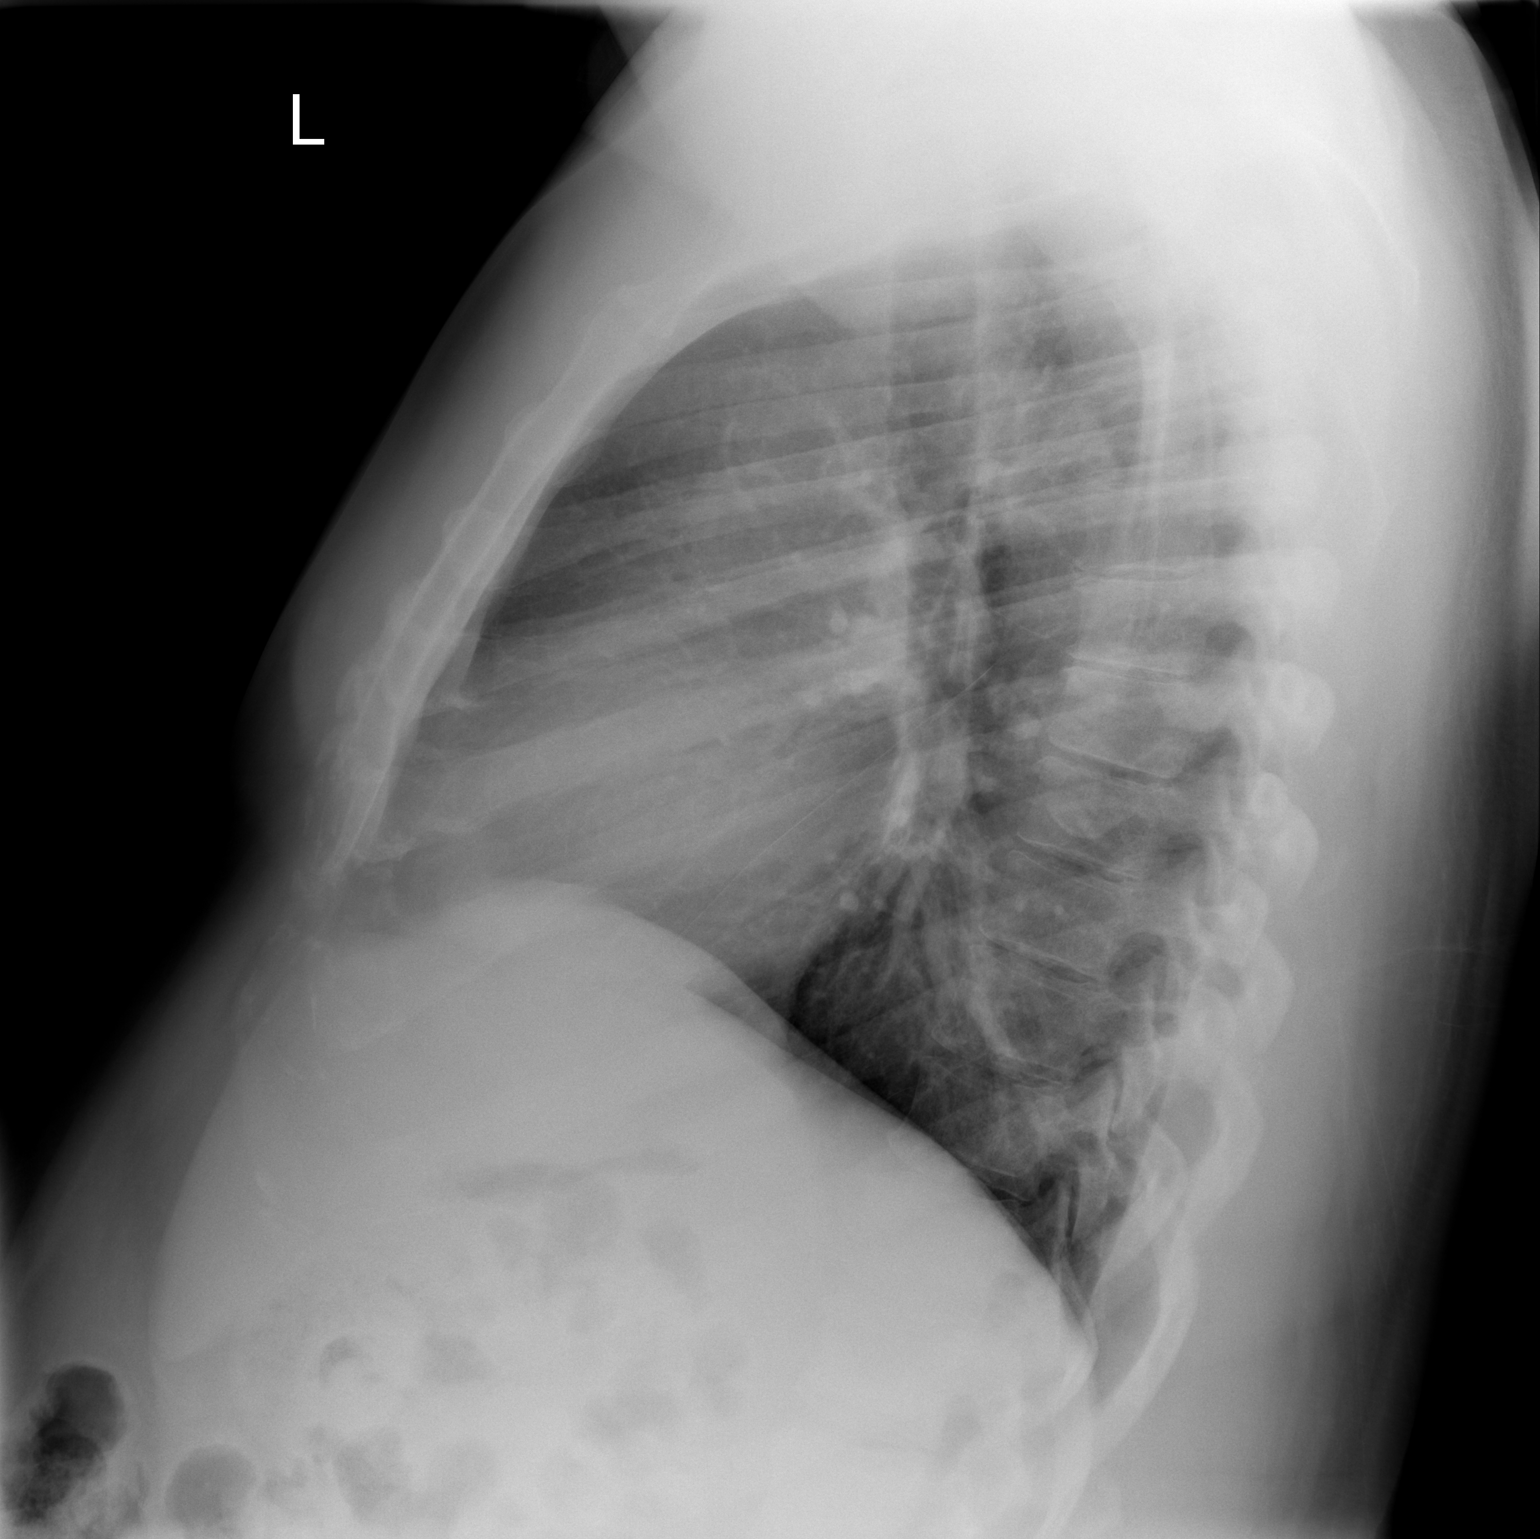

[2 of 2 positions shown; findings below may reference images not displayed]

FINDINGS: Lung volumes are within normal limits.  Cardiac size and
mediastinal contours are within normal limits.  No pneumothorax,
pulmonary edema, pleural effusion, or consolidation.  Mildly
prominent bronchovascular opacity in left lower lobe, but no
confluent airspace disease. Stable visualized osseous structures.
IMPRESSION: No acute cardiopulmonary abnormality.

## 2010-01-12 ENCOUNTER — Encounter (INDEPENDENT_AMBULATORY_CARE_PROVIDER_SITE_OTHER): Payer: Self-pay | Admitting: *Deleted

## 2010-01-16 ENCOUNTER — Telehealth: Payer: Self-pay | Admitting: Internal Medicine

## 2010-01-29 ENCOUNTER — Telehealth (INDEPENDENT_AMBULATORY_CARE_PROVIDER_SITE_OTHER): Payer: Self-pay | Admitting: *Deleted

## 2010-02-14 IMAGING — CR DG CHEST 2V
2 series · 2 of 2 positions shown · non-contrast
Comparison: 02/13/2009

CLINICAL DATA: Diabetic.  Hyperglycemia.

CHEST - 2 VIEW

[w chest pa *]
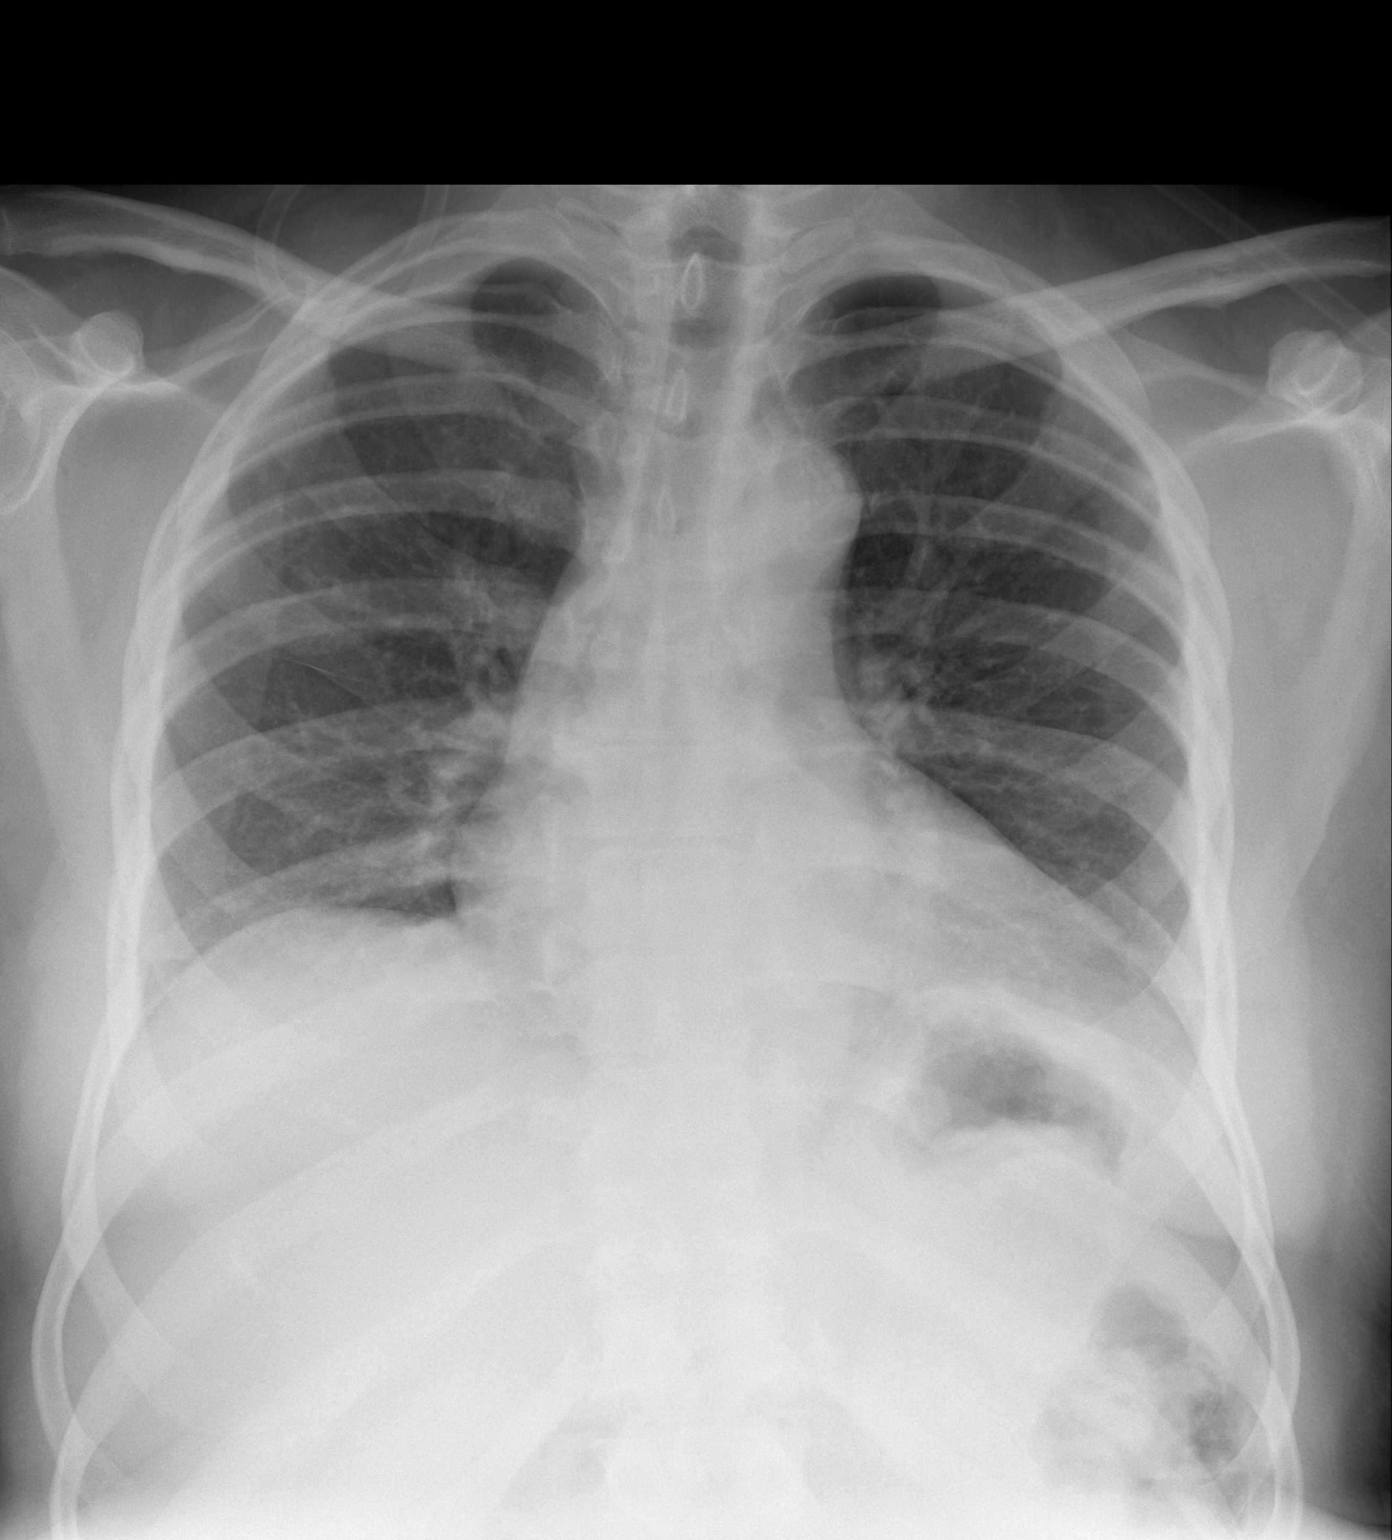

[w chest lat *]
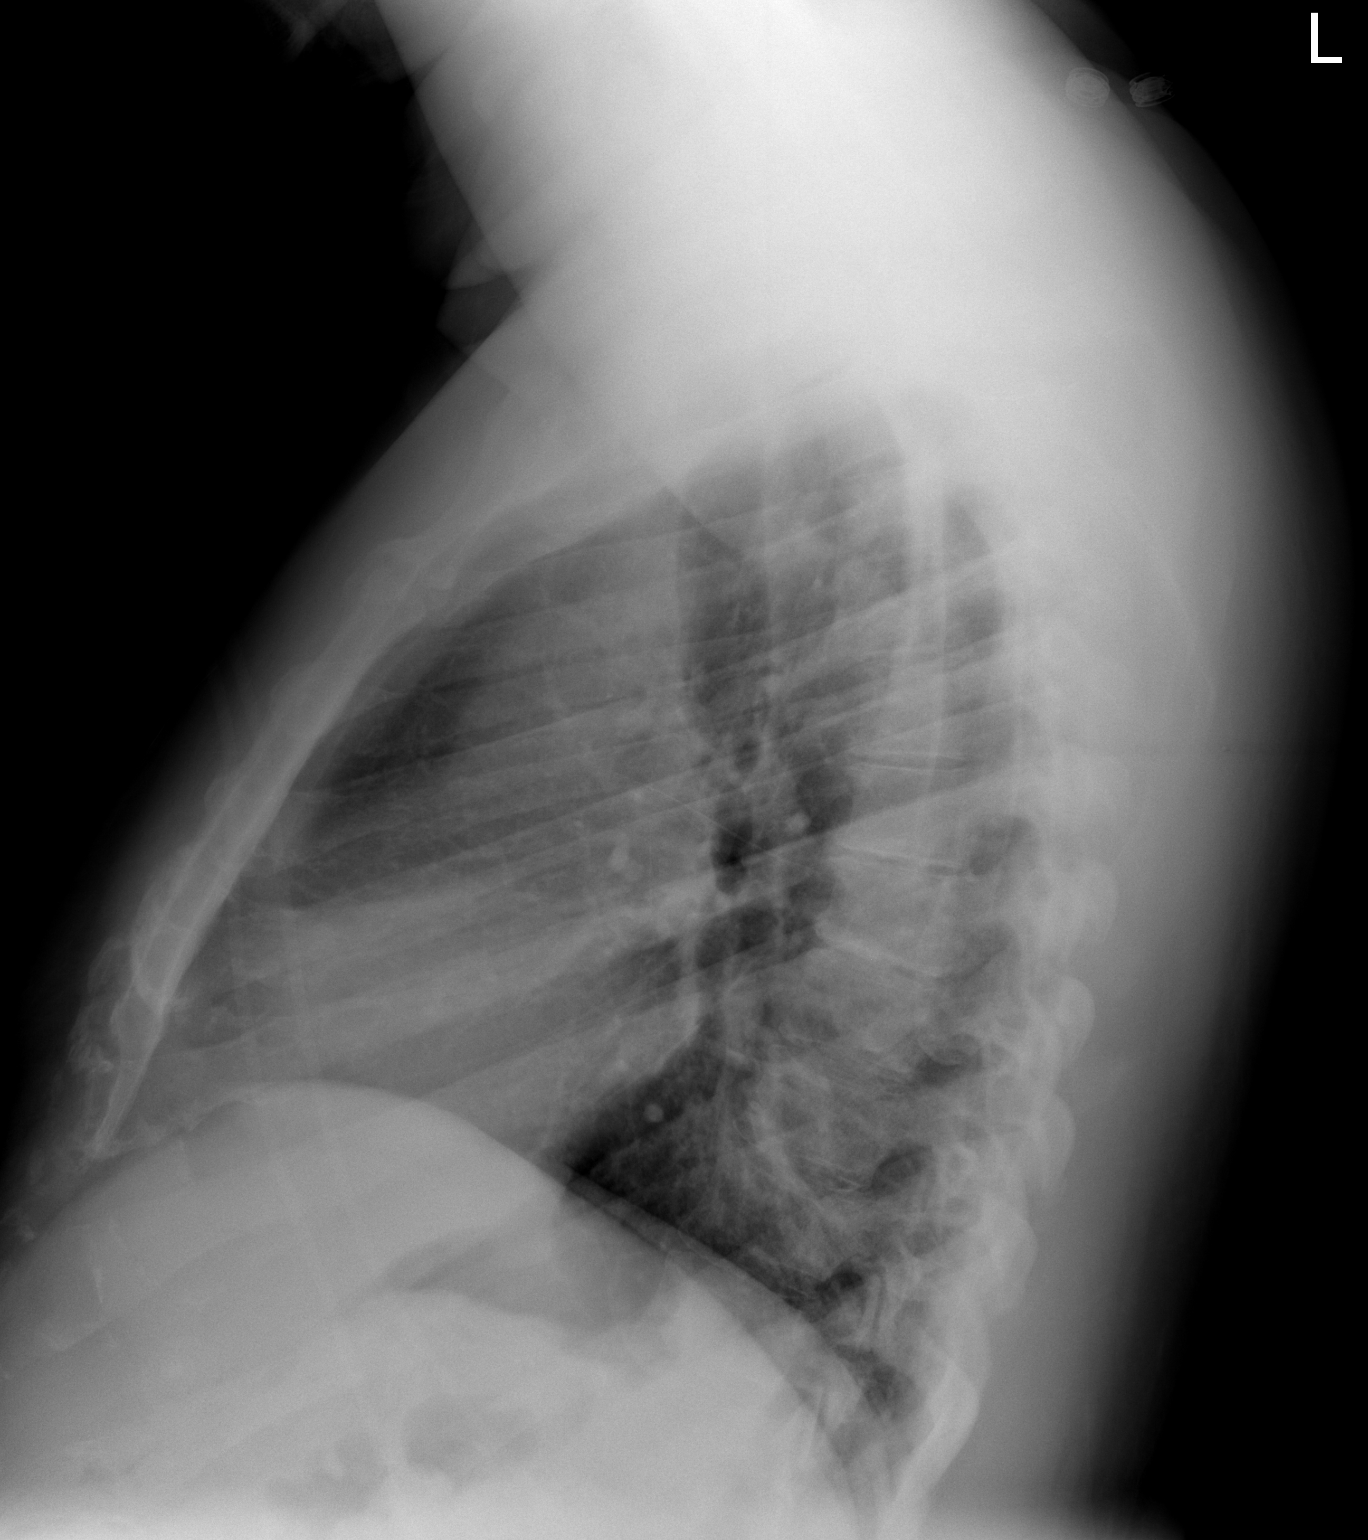

[2 of 2 positions shown; findings below may reference images not displayed]

FINDINGS: There are low lung volumes with bibasilar atelectasis and
mild cardiomegaly.  No confluent opacities, edema, or effusions.
No acute bony abnormality.
IMPRESSION: Mild cardiomegaly.

Bibasilar atelectasis.

## 2010-02-26 ENCOUNTER — Encounter (INDEPENDENT_AMBULATORY_CARE_PROVIDER_SITE_OTHER): Payer: Self-pay | Admitting: *Deleted

## 2010-03-06 ENCOUNTER — Encounter (INDEPENDENT_AMBULATORY_CARE_PROVIDER_SITE_OTHER): Payer: Self-pay | Admitting: *Deleted

## 2010-03-07 ENCOUNTER — Observation Stay (HOSPITAL_COMMUNITY)
Admission: EM | Admit: 2010-03-07 | Discharge: 2010-03-07 | Payer: Self-pay | Source: Home / Self Care | Admitting: Emergency Medicine

## 2010-03-31 ENCOUNTER — Observation Stay (HOSPITAL_COMMUNITY)
Admission: EM | Admit: 2010-03-31 | Discharge: 2010-04-02 | Payer: Self-pay | Source: Home / Self Care | Attending: Internal Medicine | Admitting: Internal Medicine

## 2010-04-05 ENCOUNTER — Ambulatory Visit
Admission: RE | Admit: 2010-04-05 | Discharge: 2010-04-05 | Payer: Self-pay | Source: Home / Self Care | Attending: Internal Medicine | Admitting: Internal Medicine

## 2010-04-05 LAB — HM DIABETES FOOT EXAM

## 2010-04-05 LAB — CONVERTED CEMR LAB: Blood Glucose, Fingerstick: 548

## 2010-04-09 ENCOUNTER — Ambulatory Visit: Admit: 2010-04-09 | Payer: Self-pay

## 2010-04-09 ENCOUNTER — Ambulatory Visit: Admission: RE | Admit: 2010-04-09 | Discharge: 2010-04-09 | Payer: Self-pay | Source: Home / Self Care

## 2010-04-09 ENCOUNTER — Ambulatory Visit
Admission: RE | Admit: 2010-04-09 | Discharge: 2010-04-09 | Payer: Self-pay | Source: Home / Self Care | Attending: Internal Medicine | Admitting: Internal Medicine

## 2010-04-09 LAB — CONVERTED CEMR LAB
Blood Glucose, Fingerstick: 586
Hgb A1c MFr Bld: >14 %

## 2010-04-10 ENCOUNTER — Encounter: Payer: Self-pay | Admitting: Internal Medicine

## 2010-04-10 ENCOUNTER — Ambulatory Visit
Admission: RE | Admit: 2010-04-10 | Discharge: 2010-04-10 | Payer: Self-pay | Source: Home / Self Care | Attending: Internal Medicine | Admitting: Internal Medicine

## 2010-04-10 DIAGNOSIS — M25569 Pain in unspecified knee: Secondary | ICD-10-CM | POA: Insufficient documentation

## 2010-04-10 LAB — CONVERTED CEMR LAB
Amphetamine Screen, Ur: NEGATIVE
Barbiturate Quant, Ur: NEGATIVE
Benzodiazepines.: NEGATIVE
Blood Glucose, Fingerstick: 379
Cocaine Metabolites: NEGATIVE
Creatinine, Urine: 98.2 mg/dL
Creatinine,U: 99.2 mg/dL
Marijuana Metabolite: NEGATIVE
Methadone: NEGATIVE
Microalb Creat Ratio: 75.9 mg/g — ABNORMAL HIGH (ref 0.0–30.0)
Microalb, Ur: 7.45 mg/dL — ABNORMAL HIGH (ref 0.00–1.89)
Opiates: NEGATIVE
Phencyclidine (PCP): NEGATIVE
Propoxyphene: NEGATIVE

## 2010-04-11 IMAGING — CR DG CHEST 2V
2 series · 2 of 2 positions shown · non-contrast
Comparison: Chest radiograph performed 03/20/2009

CLINICAL DATA: Headache and dizziness; hyperglycemia.

CHEST - 2 VIEW

[w chest pa]
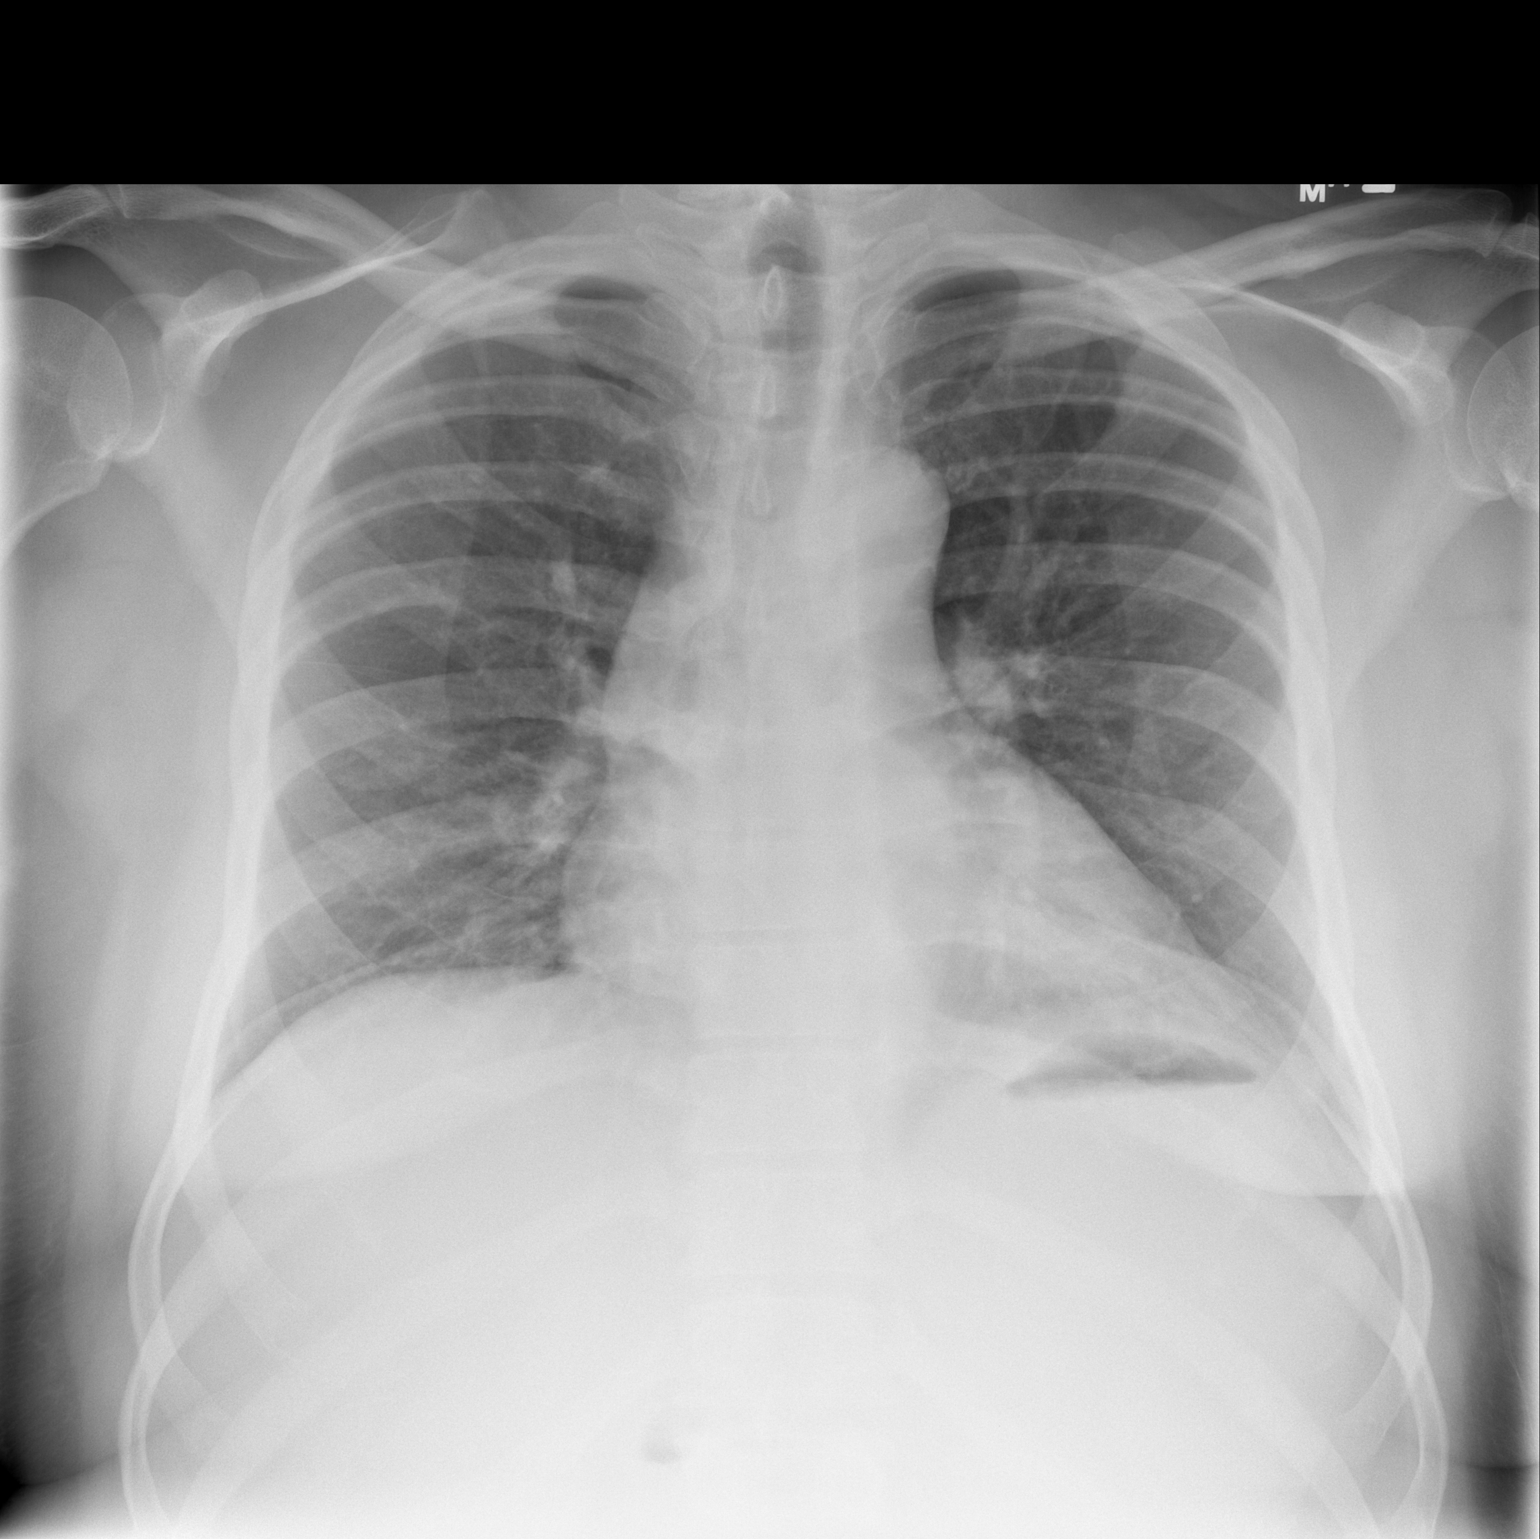

[w chest lat]
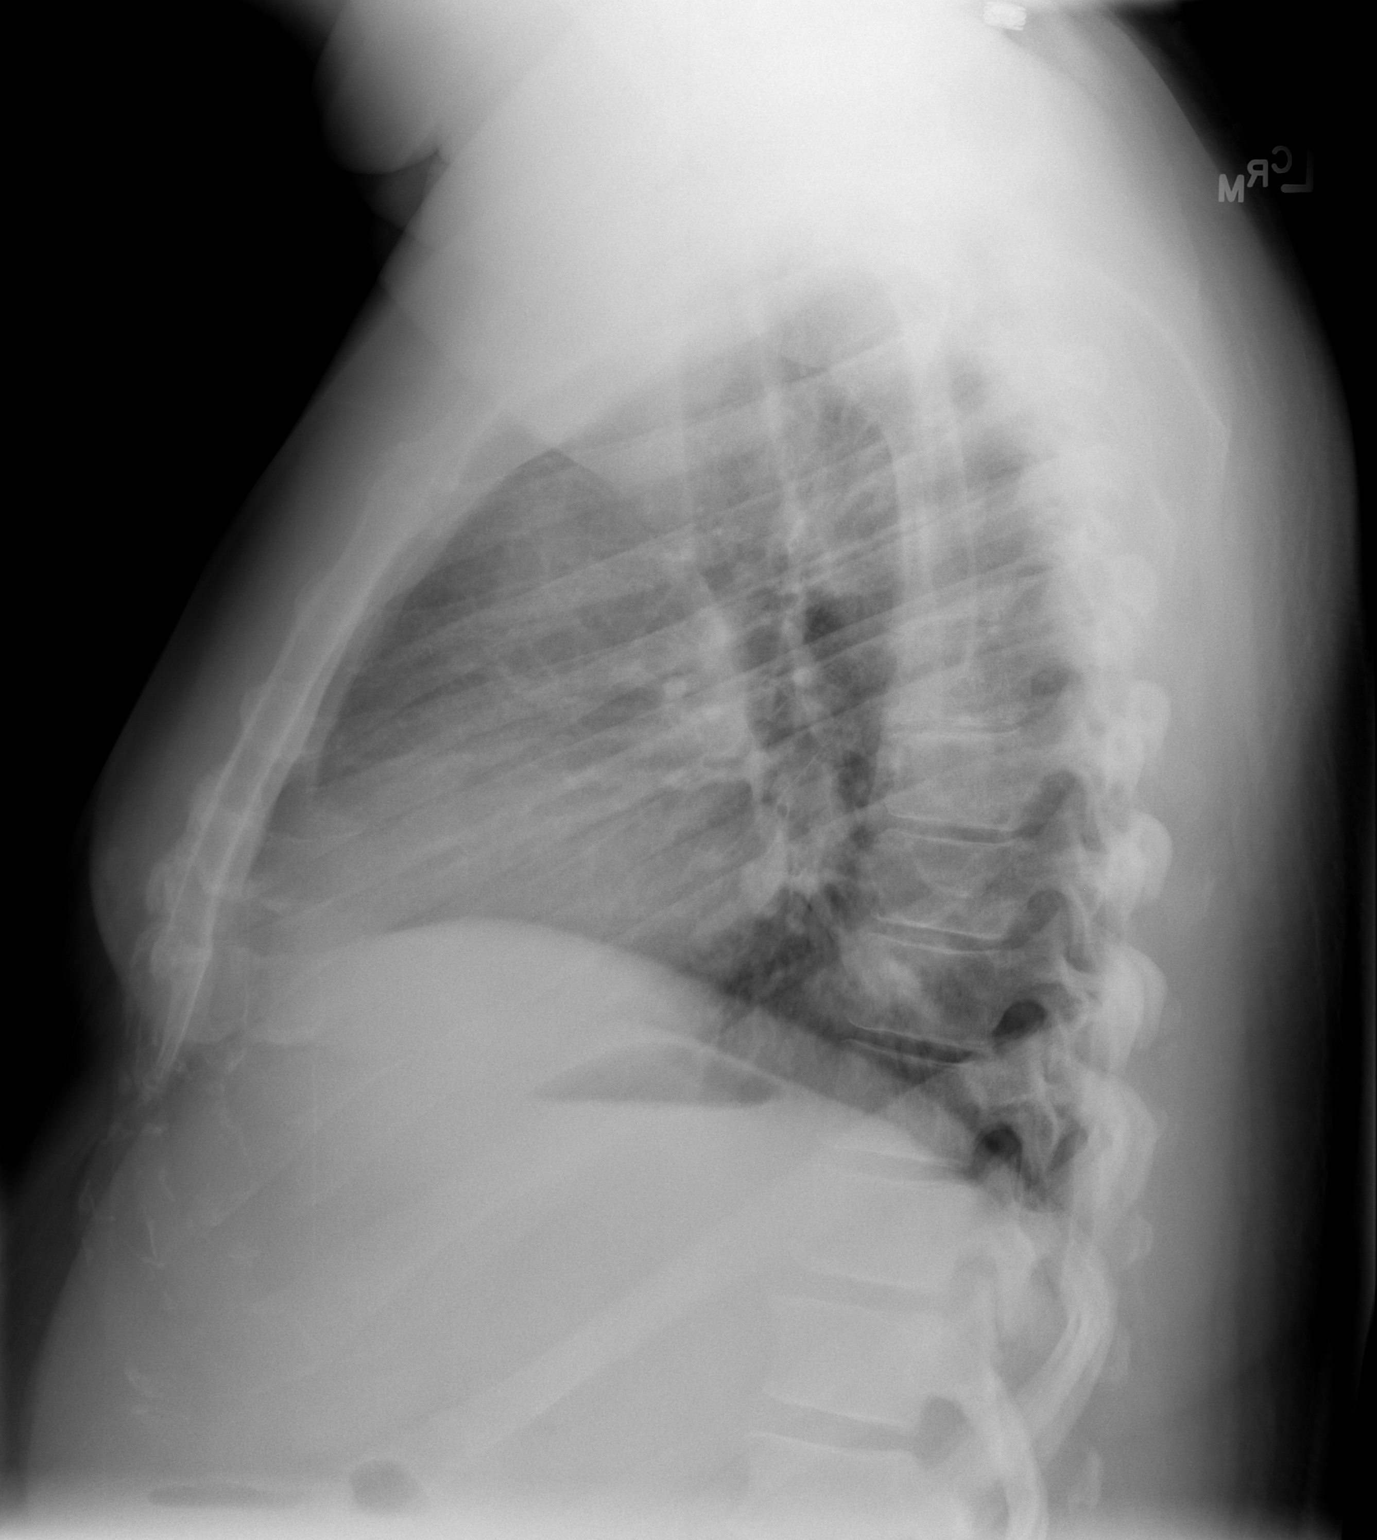

[2 of 2 positions shown; findings below may reference images not displayed]

FINDINGS: The lungs are well-aerated.  Mild left basilar
atelectasis is noted.  There is no evidence of focal opacification,
pleural effusion or pneumothorax.

The heart is mildly enlarged.  No acute osseous abnormalities are
seen.
IMPRESSION: 1.  Mild left basilar atelectasis noted.
2.  Mild stable cardiomegaly.

## 2010-04-12 ENCOUNTER — Telehealth: Payer: Self-pay | Admitting: Internal Medicine

## 2010-04-13 ENCOUNTER — Ambulatory Visit: Admit: 2010-04-13 | Payer: Self-pay | Admitting: Internal Medicine

## 2010-04-17 IMAGING — CR DG CHEST 2V
2 series · 2 of 2 positions shown · non-contrast
Comparison: Chest x-ray of 05/15/2009

CLINICAL DATA: Diabetes, weakness

CHEST - 2 VIEW

[w chest pa]
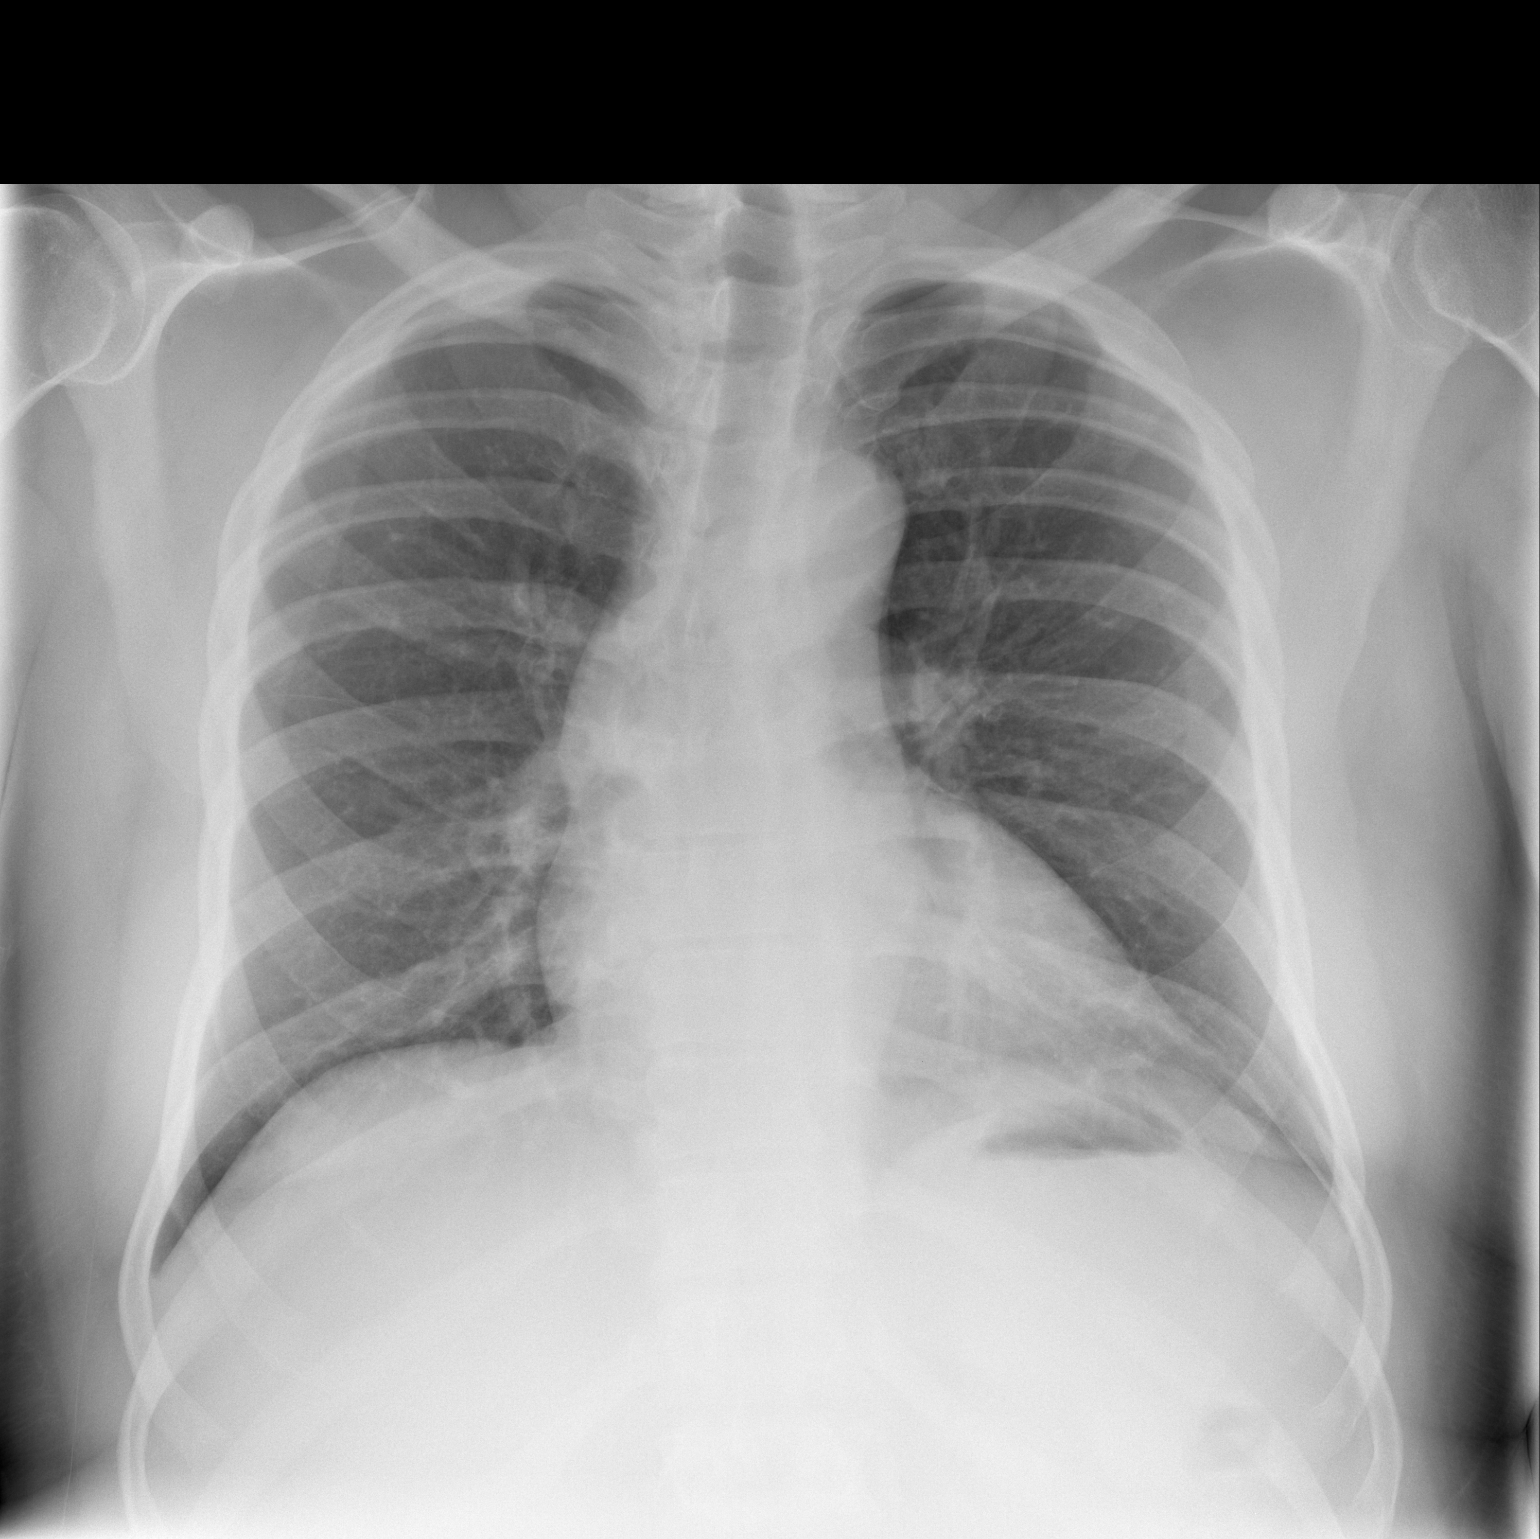

[w chest lat]
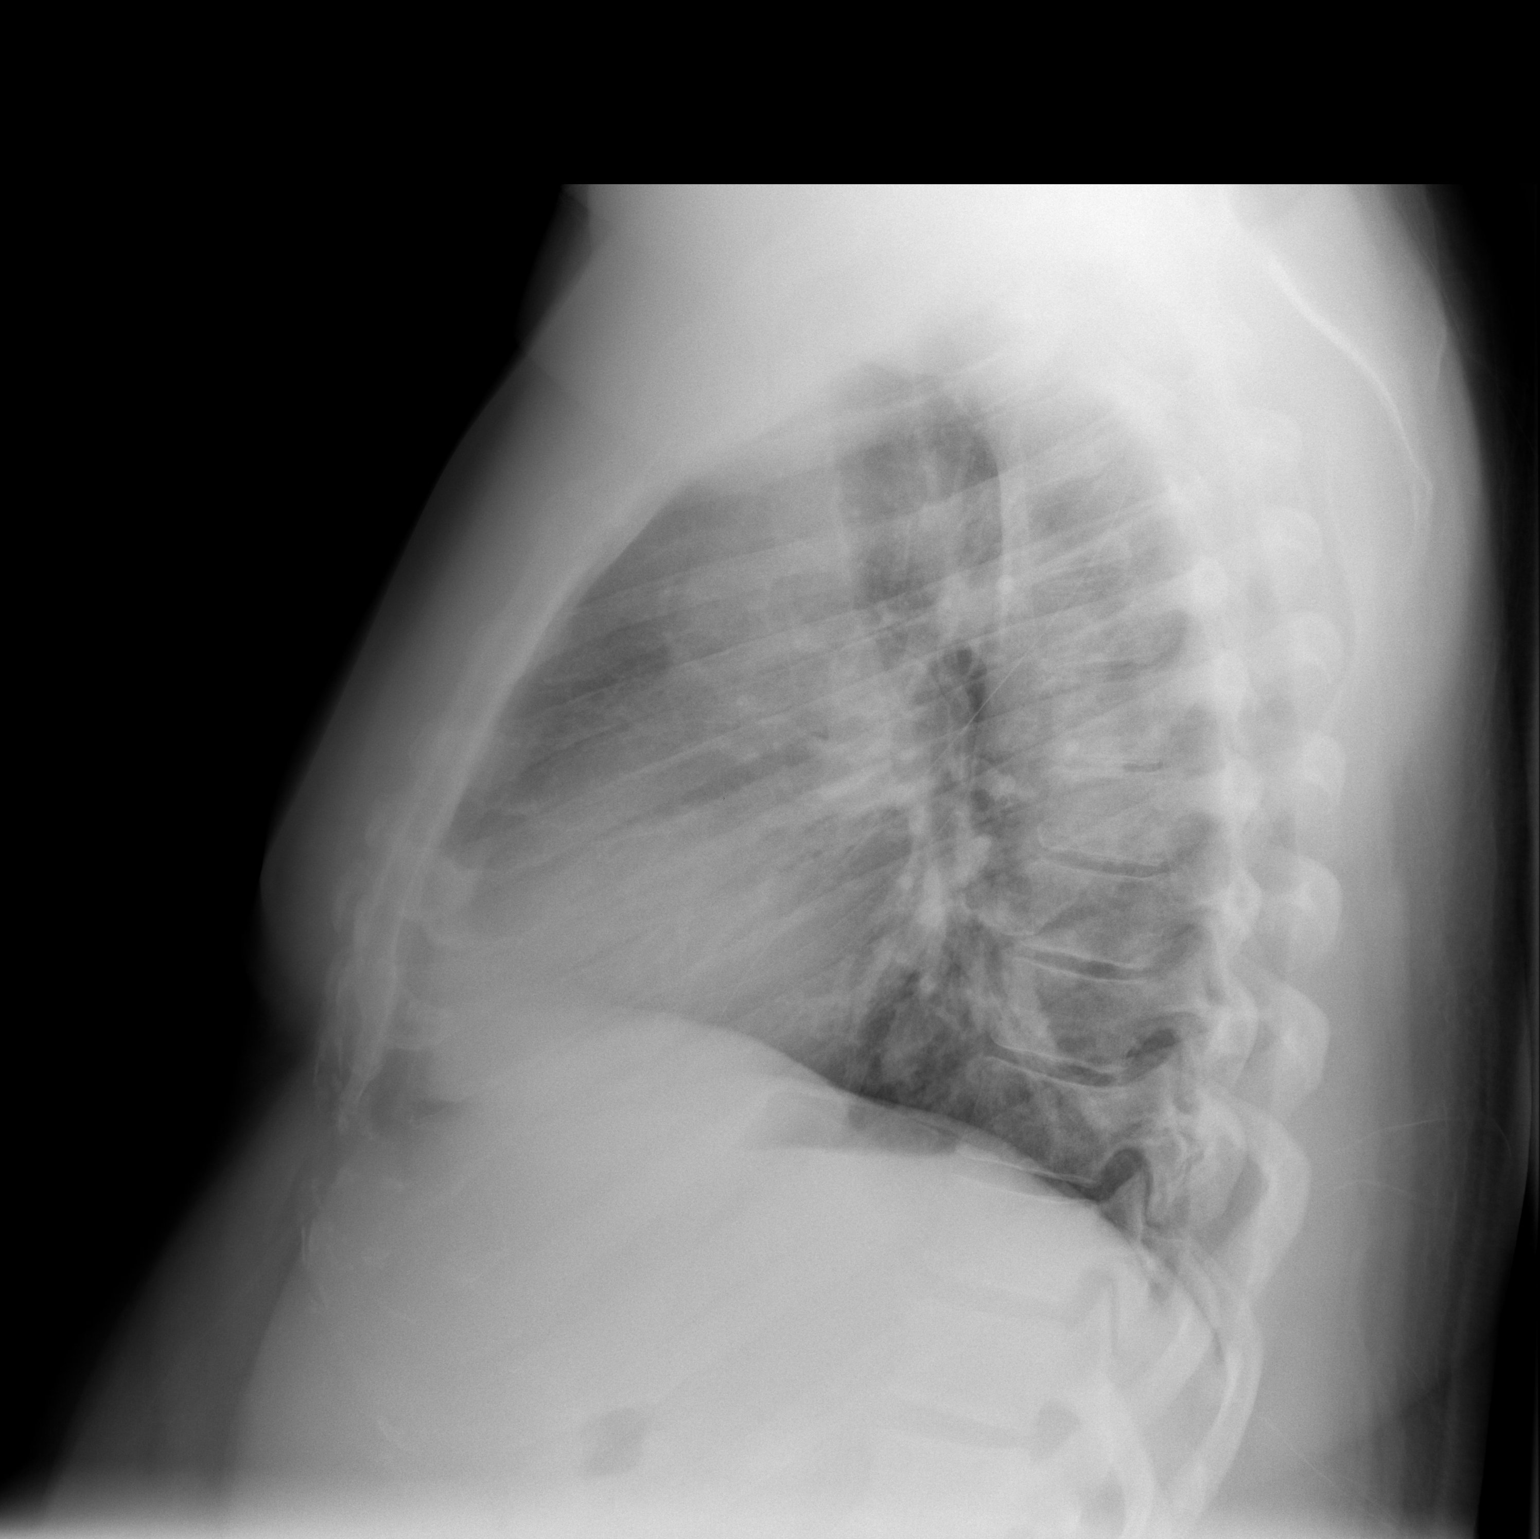

[2 of 2 positions shown; findings below may reference images not displayed]

FINDINGS: The lungs are clear.  The heart is within normal limits
in size.  No bony abnormality is seen.
IMPRESSION: Stable chest x-ray.  No active lung disease.

## 2010-04-18 ENCOUNTER — Ambulatory Visit: Admission: RE | Admit: 2010-04-18 | Discharge: 2010-04-18 | Payer: Self-pay | Source: Home / Self Care

## 2010-04-18 LAB — CONVERTED CEMR LAB
BUN: 24 mg/dL — ABNORMAL HIGH (ref 6–23)
CO2: 25 meq/L (ref 19–32)
Calcium: 10.5 mg/dL (ref 8.4–10.5)
Chloride: 89 meq/L — ABNORMAL LOW (ref 96–112)
Creatinine, Ser: 1.15 mg/dL (ref 0.40–1.50)
Glucose, Bld: 591 mg/dL (ref 70–99)
Potassium: 4.9 meq/L (ref 3.5–5.3)
Sodium: 126 meq/L — ABNORMAL LOW (ref 135–145)

## 2010-04-19 ENCOUNTER — Encounter
Admission: RE | Admit: 2010-04-19 | Discharge: 2010-05-08 | Payer: Self-pay | Source: Home / Self Care | Attending: Physical Medicine & Rehabilitation | Admitting: Physical Medicine & Rehabilitation

## 2010-04-19 ENCOUNTER — Ambulatory Visit: Admit: 2010-04-19 | Payer: Self-pay

## 2010-04-20 ENCOUNTER — Ambulatory Visit: Admit: 2010-04-20 | Payer: Self-pay | Admitting: Physical Medicine & Rehabilitation

## 2010-04-20 ENCOUNTER — Encounter
Admission: RE | Admit: 2010-04-20 | Discharge: 2010-05-08 | Payer: Self-pay | Source: Home / Self Care | Attending: Physical Medicine & Rehabilitation | Admitting: Physical Medicine & Rehabilitation

## 2010-04-23 LAB — GLUCOSE, CAPILLARY: Glucose-Capillary: >600 mg/dL (ref 70–99)

## 2010-04-24 ENCOUNTER — Encounter
Admission: RE | Admit: 2010-04-24 | Discharge: 2010-05-08 | Payer: Self-pay | Source: Home / Self Care | Attending: Physical Medicine & Rehabilitation | Admitting: Physical Medicine & Rehabilitation

## 2010-04-24 ENCOUNTER — Ambulatory Visit
Admission: RE | Admit: 2010-04-24 | Discharge: 2010-04-24 | Payer: Self-pay | Source: Home / Self Care | Attending: Physical Medicine & Rehabilitation | Admitting: Physical Medicine & Rehabilitation

## 2010-04-25 ENCOUNTER — Encounter: Payer: Self-pay | Admitting: Infectious Diseases

## 2010-04-26 ENCOUNTER — Encounter (INDEPENDENT_AMBULATORY_CARE_PROVIDER_SITE_OTHER): Payer: Self-pay | Admitting: *Deleted

## 2010-04-27 ENCOUNTER — Ambulatory Visit
Admission: RE | Admit: 2010-04-27 | Discharge: 2010-04-27 | Payer: Self-pay | Source: Home / Self Care | Attending: Internal Medicine | Admitting: Internal Medicine

## 2010-04-27 ENCOUNTER — Encounter: Payer: Self-pay | Admitting: Internal Medicine

## 2010-04-27 ENCOUNTER — Encounter: Payer: Self-pay | Admitting: Infectious Diseases

## 2010-04-27 LAB — CONVERTED CEMR LAB: Hgb A1c MFr Bld: 17.6 % — ABNORMAL HIGH (ref <5.7)

## 2010-04-30 ENCOUNTER — Ambulatory Visit: Admission: RE | Admit: 2010-04-30 | Discharge: 2010-04-30 | Payer: Self-pay | Source: Home / Self Care

## 2010-04-30 ENCOUNTER — Ambulatory Visit
Admission: RE | Admit: 2010-04-30 | Discharge: 2010-04-30 | Payer: Self-pay | Source: Home / Self Care | Attending: Internal Medicine | Admitting: Internal Medicine

## 2010-04-30 ENCOUNTER — Encounter: Payer: Self-pay | Admitting: Adult Health

## 2010-04-30 LAB — CONVERTED CEMR LAB
Blood Glucose, Fingerstick: 520
HIV 1 RNA Quant: 13800 copies/mL — ABNORMAL HIGH (ref <20)
HIV-1 RNA Quant, Log: 4.14 — ABNORMAL HIGH (ref <1.30)

## 2010-05-01 ENCOUNTER — Ambulatory Visit
Admission: RE | Admit: 2010-05-01 | Discharge: 2010-05-01 | Payer: Self-pay | Source: Home / Self Care | Attending: Family Medicine | Admitting: Family Medicine

## 2010-05-01 ENCOUNTER — Emergency Department (HOSPITAL_COMMUNITY)
Admission: EM | Admit: 2010-05-01 | Discharge: 2010-05-02 | Payer: Self-pay | Source: Home / Self Care | Admitting: Emergency Medicine

## 2010-05-01 ENCOUNTER — Telehealth (INDEPENDENT_AMBULATORY_CARE_PROVIDER_SITE_OTHER): Payer: Self-pay | Admitting: *Deleted

## 2010-05-01 DIAGNOSIS — M2390 Unspecified internal derangement of unspecified knee: Secondary | ICD-10-CM | POA: Insufficient documentation

## 2010-05-02 LAB — CONVERTED CEMR LAB
ALT: 14 units/L (ref 0–53)
AST: 16 units/L (ref 0–37)
Albumin: 4.7 g/dL (ref 3.5–5.2)
Alkaline Phosphatase: 100 units/L (ref 39–117)
BUN: 20 mg/dL (ref 6–23)
Basophils Absolute: 0 10*3/uL (ref 0.0–0.1)
Basophils Relative: 0 % (ref 0–1)
CO2: 28 meq/L (ref 19–32)
Calcium: 10.5 mg/dL (ref 8.4–10.5)
Chloride: 87 meq/L — ABNORMAL LOW (ref 96–112)
Creatinine, Ser: 1.34 mg/dL (ref 0.40–1.50)
Eosinophils Absolute: 0.1 10*3/uL (ref 0.0–0.7)
Eosinophils Relative: 1 % (ref 0–5)
Glucose, Bld: 568 mg/dL (ref 70–99)
HCT: 43.6 % (ref 39.0–52.0)
Hemoglobin: 14.8 g/dL (ref 13.0–17.0)
Lymphocytes Relative: 31 % (ref 12–46)
Lymphs Abs: 1.5 10*3/uL (ref 0.7–4.0)
MCHC: 33.9 g/dL (ref 30.0–36.0)
MCV: 79.6 fL (ref 78.0–100.0)
Monocytes Absolute: 0.3 10*3/uL (ref 0.1–1.0)
Monocytes Relative: 7 % (ref 3–12)
Neutro Abs: 3 10*3/uL (ref 1.7–7.7)
Neutrophils Relative %: 62 % (ref 43–77)
Platelets: 227 10*3/uL (ref 150–400)
Potassium: 4.8 meq/L (ref 3.5–5.3)
RBC: 5.48 M/uL (ref 4.22–5.81)
RDW: 14.2 % (ref 11.5–15.5)
Sodium: 127 meq/L — ABNORMAL LOW (ref 135–145)
Total Bilirubin: 0.5 mg/dL (ref 0.3–1.2)
Total Protein: 9.2 g/dL — ABNORMAL HIGH (ref 6.0–8.3)
WBC: 4.9 10*3/uL (ref 4.0–10.5)

## 2010-05-02 LAB — BASIC METABOLIC PANEL
BUN: 25 mg/dL — ABNORMAL HIGH (ref 6–23)
CO2: 22 mEq/L (ref 19–32)
Calcium: 10.3 mg/dL (ref 8.4–10.5)
Chloride: 91 mEq/L — ABNORMAL LOW (ref 96–112)
Creatinine, Ser: 0.96 mg/dL (ref 0.4–1.5)
GFR calc Af Amer: >60 mL/min (ref >60)
GFR calc non Af Amer: >60 mL/min (ref >60)
Glucose, Bld: 589 mg/dL (ref 70–99)
Potassium: 4.7 mEq/L (ref 3.5–5.1)
Sodium: 126 mEq/L — ABNORMAL LOW (ref 135–145)

## 2010-05-02 LAB — URINALYSIS, ROUTINE W REFLEX MICROSCOPIC
Bilirubin Urine: NEGATIVE
Hgb urine dipstick: NEGATIVE
Ketones, ur: 15 mg/dL — AB
Leukocytes, UA: NEGATIVE
Nitrite: NEGATIVE
Protein, ur: NEGATIVE mg/dL
Specific Gravity, Urine: 1.032 — ABNORMAL HIGH (ref 1.005–1.030)
Urine Glucose, Fasting: >1000 mg/dL — AB
Urobilinogen, UA: 0.2 mg/dL (ref 0.0–1.0)
pH: 5 (ref 5.0–8.0)

## 2010-05-02 LAB — DIFFERENTIAL
Basophils Absolute: 0 10*3/uL (ref 0.0–0.1)
Basophils Relative: 0 % (ref 0–1)
Eosinophils Absolute: 0.1 10*3/uL (ref 0.0–0.7)
Eosinophils Relative: 1 % (ref 0–5)
Lymphocytes Relative: 35 % (ref 12–46)
Lymphs Abs: 2.4 10*3/uL (ref 0.7–4.0)
Monocytes Absolute: 0.4 10*3/uL (ref 0.1–1.0)
Monocytes Relative: 6 % (ref 3–12)
Neutro Abs: 4.1 10*3/uL (ref 1.7–7.7)
Neutrophils Relative %: 58 % (ref 43–77)

## 2010-05-02 LAB — POCT CARDIAC MARKERS
CKMB, poc: 2 ng/mL (ref 1.0–8.0)
Myoglobin, poc: 158 ng/mL (ref 12–200)
Troponin i, poc: <0.05 ng/mL (ref 0.00–0.09)

## 2010-05-02 LAB — CBC
HCT: 40.6 % (ref 39.0–52.0)
Hemoglobin: 14.1 g/dL (ref 13.0–17.0)
MCH: 27.1 pg (ref 26.0–34.0)
MCHC: 34.7 g/dL (ref 30.0–36.0)
MCV: 78.1 fL (ref 78.0–100.0)
Platelets: 221 10*3/uL (ref 150–400)
RBC: 5.2 MIL/uL (ref 4.22–5.81)
RDW: 13.7 % (ref 11.5–15.5)
WBC: 7 10*3/uL (ref 4.0–10.5)

## 2010-05-02 LAB — GLUCOSE, CAPILLARY
Glucose-Capillary: >600 mg/dL (ref 70–99)
Glucose-Capillary: 498 mg/dL — ABNORMAL HIGH (ref 70–99)
Glucose-Capillary: 414 mg/dL — ABNORMAL HIGH (ref 70–99)
Glucose-Capillary: 420 mg/dL — ABNORMAL HIGH (ref 70–99)
Glucose-Capillary: 402 mg/dL — ABNORMAL HIGH (ref 70–99)
Glucose-Capillary: 259 mg/dL — ABNORMAL HIGH (ref 70–99)

## 2010-05-02 LAB — URINE MICROSCOPIC-ADD ON

## 2010-05-02 LAB — T-HELPER CELL (CD4) - (RCID CLINIC ONLY)
CD4 % Helper T Cell: 6 % — ABNORMAL LOW (ref 33–55)
CD4 T Cell Abs: 100 uL — ABNORMAL LOW (ref 400–2700)

## 2010-05-02 LAB — KETONES, QUALITATIVE: Acetone, Bld: NEGATIVE

## 2010-05-07 ENCOUNTER — Encounter (INDEPENDENT_AMBULATORY_CARE_PROVIDER_SITE_OTHER): Payer: Self-pay | Admitting: *Deleted

## 2010-05-08 NOTE — Progress Notes (Signed)
Summary: ncadap meds arrived for Sept-left msg for pt to call office  Phone Note Refill Request      Prescriptions: ZIDOVUDINE 300 MG TABS (ZIDOVUDINE) one tablet by mouth two times a day  #60 x 0   Entered by:   Canary Brim  BS,CPht II,MPH   Authorized by:   Aldona Bar MD   Signed by:   Canary Brim  BS,CPht II,MPH on 12/29/2009   Method used:   Samples Given   RxID:   [id number] NORVIR 100 MG TABS (RITONAVIR) Take 1 tablet by mouth once a day  #30 x 0   Entered by:   Canary Brim  BS,CPht II,MPH   Authorized by:   Aldona Bar MD   Signed by:   Canary Brim  BS,CPht II,MPH on 12/29/2009   Method used:   Samples Given   RxID:   [id number] DAPSONE 100 MG  TABS (DAPSONE) Take 1 tablet by mouth once a day  #30 x 0   Entered by:   Canary Brim  BS,CPht II,MPH   Authorized by:   Aldona Bar MD   Signed by:   Canary Brim  BS,CPht II,MPH on 12/29/2009   Method used:   Samples Given   RxID:   [id number] REYATAZ 300 MG  CAPS (ATAZANAVIR SULFATE) Take 1 tablet by mouth once a day  #30 x 0   Entered by:   Canary Brim  BS,CPht II,MPH   Authorized by:   Aldona Bar MD   Signed by:   Canary Brim  BS,CPht II,MPH on 12/29/2009   Method used:   Samples Given   RxID:   [id number] TRUVADA 200-300 MG TABS (EMTRICITABINE-TENOFOVIR) Take 1 tablet by mouth once a day  #30 x 0   Entered by:   Canary Brim  BS,CPht II,MPH   Authorized by:   Aldona Bar MD   Signed by:   Canary Brim  BS,CPht II,MPH on 12/29/2009   Method used:   Samples Given   RxID:   [id number]  Patient Assist Medication Verification: Medication name: Truvada RX # [id number] Tech approval:mld  Patient Assist Medication Verification: Medication name:reyataz 300mg  RX # [id number] Tech approval:mld  Patient Assist Medication Verification: Medication name:zidovudine 300mg  RX # [id number] Tech approval:mld  Patient Assist Medication Verification: Medication name:dapsone 100mg  RX #  [id number] Tech approval:mld  Patient Assist Medication Verification: Medication name:norvir 100mg  RX # [id number] Tech approval:mld Call placed to patient with message that assistance medications are ready for pick-up. Left message for pateint to contact our office Canary Brim  BS,CPht II,MPH  December 29, 2009 2:34 PM

## 2010-05-08 NOTE — Miscellaneous (Signed)
Summary: Office Visit ([facility] 05)    Vital Signs:  Patient profile:   45 year old male Weight:      306 pounds Temp:     98.5 degrees F oral Pulse rate:   77 / minute Pulse rhythm:   regular Resp:     20 per minute BP sitting:   147 / 96  (left arm)  Vitals Entered By: Barbra Sarks RN (April 27, 2009 10:33 AM) CC: f/u 05 Is Patient Diabetic? Yes Did you bring your meter with you today? Yes Pain Assessment Patient in pain? no      CBG Result 276  Does patient need assistance? Functional Status Self care Ambulation Normal   CC:  f/u 05.  History of Present Illness: Pt continues to struggle with his BS.  It is running in the 200-300 range.  He claims that he is following a DM diet.  He is applying for disability because he has not been feeling well due to uncontrolled DM.  Preventive Screening-Counseling & Management  Alcohol-Tobacco     Alcohol drinks/day: 0     Smoking Status: never  Caffeine-Diet-Exercise     Caffeine use/day: 0     Does Patient Exercise: yes     Type of exercise: walks     Exercise (avg: min/session): >60     Times/week: <3  Current Problems (verified): 1)  Paronychia, Finger  (ICD-681.02) 2)  Dermatophytosis of Unspecified Site  (ICD-110.9) 3)  Erectile Dysfunction  (ICD-302.72) 4)  Venereal Wart  (ICD-078.11) 5)  Hypertension  (ICD-401.9) 6)  Diabetes Mellitus, Type II  (ICD-250.00) 7)  HIV Disease  (ICD-042)  Current Medications (verified): 1)  Truvada 200-300 Mg Tabs (Emtricitabine-Tenofovir) .... Take 1 Tablet By Mouth Once A Day 2)  Reyataz 300 Mg  Caps (Atazanavir Sulfate) .... Take 1 Tablet By Mouth Once A Day 3)  Norvir 100 Mg Caps (Ritonavir) .... Take 1 Capsule By Mouth Once A Day 4)  Dapsone 100 Mg  Tabs (Dapsone) .... Take 1 Tablet By Mouth Once A Day 5)  Glucophage 1000 Mg Tabs (Metformin Hcl) .... Take 1 Tablet By Mouth Two Times A Day 6)  Androgel 50 Mg/5gm Gel (Testosterone) .... Apply One Packet Q Day 7)   Norvir Tablets 100 Mg .... Take 1 By Mouth Daily. 8)  Podofilox 0.5 % Soln (Podofilox) .... Apply Twice A Day For 12 Hours Then Stop For 4 Days - May Repeat 3 Times 9)  Clotrimazole 1 % Crea (Clotrimazole) .... Apply Two Times A Day 10)  Viagra 50 Mg Tabs (Sildenafil Citrate) .... Use As Directed 11)  Lipitor 20 Mg .... Take One  Tablet By Mouth Daily. 12)  Novolin 70/30 70-30 % Susp (Insulin Isophane & Regular) .... 50 Units Before Breakfast and 40 Units Before Dinner 13)  Vicodin 5-500 Mg Tabs (Hydrocodone-Acetaminophen) .... Take 1 Tablet By Mouth Every 8 Hours As Needed 14)  Aspirin 81 Mg .... Take One Tablet By Mouth Daily. 15)  Lisinopril 40 Mg Tabs (Lisinopril) .... Take 1 Tablet By Mouth Once A Day  Allergies: 1)  ! Sulfa   Review of Systems  The patient denies anorexia, fever, weight loss, chest pain, and headaches.     Physical Exam  General:  alert, well-hydrated, and overweight-appearing.   Head:  normocephalic and atraumatic.   Lungs:  normal breath sounds.     Impression & Recommendations:  Problem # 1:  HIV DISEASE (ICD-042) VL is up.  Genotype pending.  Pt isntructed  to take his meds every day and not to miss any doses. Diagnostics Reviewed:  HIV: CDC-defined AIDS (04/13/2009)   CD4: 198 (04/14/2008)   WBC: 5.4 (04/13/2009)   Hgb: 11.6 (04/13/2009)   HCT: 36.5 (04/13/2009)   Platelets: 242 (04/13/2009) HIV genotype: See Comment (12/14/2008)   HIV-1 RNA: 4020 (04/13/2009)   HBSAg: negative (11/21/2005)  Problem # 2:  DIABETES MELLITUS, TYPE II (ICD-250.00) Increse his insulin dose. check BS regularly The following medications were removed from the medication list:    Lisinopril 20 Mg Tabs (Lisinopril) .Marland Kitchen... Take 1 tablet by mouth once a day His updated medication list for this problem includes:    Glucophage 1000 Mg Tabs (Metformin hcl) .Marland Kitchen... Take 1 tablet by mouth two times a day    Novolin 70/30 70-30 % Susp (Insulin isophane & regular) .Marland KitchenMarland KitchenMarland KitchenMarland Kitchen 50 units before  breakfast and 40 units before dinner    Lisinopril 40 Mg Tabs (Lisinopril) .Marland Kitchen... Take 1 tablet by mouth once a day  Problem # 3:  HYPERTENSION (ICD-401.9) increase lisinopril dose The following medications were removed from the medication list:    Lisinopril 20 Mg Tabs (Lisinopril) .Marland Kitchen... Take 1 tablet by mouth once a day His updated medication list for this problem includes:    Lisinopril 40 Mg Tabs (Lisinopril) .Marland Kitchen... Take 1 tablet by mouth once a day  Medications Added to Medication List This Visit: 1)  Novolin 70/30 70-30 % Susp (Insulin isophane & regular) .... 50 units before breakfast and 40 units before dinner 2)  Lisinopril 40 Mg Tabs (Lisinopril) .... Take 1 tablet by mouth once a day   Patient Instructions: 1)  Please schedule a follow-up appointment in 2 weeks. Prescriptions: LISINOPRIL 40 MG TABS (LISINOPRIL) Take 1 tablet by mouth once a day  #30 x 5   Entered and Authorized by:   Aldona Bar MD   Signed by:   Aldona Bar MD on 04/27/2009   Method used:   Print then Give to Patient   RxID:   [id number] NOVOLIN 70/30 70-30 % SUSP (INSULIN ISOPHANE & REGULAR) 50 units before breakfast and 40 units before dinner  #1 month x 5   Entered and Authorized by:   Aldona Bar MD   Signed by:   Aldona Bar MD on 04/27/2009   Method used:   Print then Give to Patient   RxID:   [id number]
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
## 2010-05-08 NOTE — Medication Information (Signed)
Summary: [facility] Rx: RX  [facility] Rx: [id number]   Imported By: Bonner Puna 07/27/2009 15:07:41  _____________________________________________________________________  External Attachment:    Type:   Image     Comment:   External Document

## 2010-05-08 NOTE — Miscellaneous (Signed)
Summary: Bridge Counselor  Clinical Lists Changes BC spoke with [facility] about helping pt pay for his insulin for two weeks until he is approved through the PAP. Pt went to [facility] to speak with Allyson and reported that he did not feel good. Pt went to the [facility] ED to obtain his insulin. BC called the charge nurse in the ED and requested that pt be sent home with a vial of insulin that would last him until [facility] can access ongoing medication either through the PAP or Medicaid. BC was told that it would not be a problem. BC later learned that pt can only use that program through the ED once a year and pt has already used it. Pt was released without insulin.  Maudie Mercury [facility]  November 02, 2009 5:04 PM

## 2010-05-08 NOTE — Assessment & Plan Note (Signed)
Summary: diabetes-teaching/id pt/cfb   Vital Signs:  Patient profile:   45 year old male Weight:      268.5 pounds BMI:     39.79  Allergies: 1)  ! Sulfa   Complete Medication List: 1)  Truvada 200-300 Mg Tabs (Emtricitabine-tenofovir) .... Take 1 tablet by mouth once a day 2)  Reyataz 300 Mg Caps (Atazanavir sulfate) .... Take 1 tablet by mouth once a day 3)  Dapsone 100 Mg Tabs (Dapsone) .... Take 1 tablet by mouth once a day 4)  Glucophage 1000 Mg Tabs (Metformin hcl) .... Take 1 tablet by mouth two times a day 5)  Androgel 50 Mg/5gm Gel (Testosterone) .... Apply one packet q day 6)  Norvir 100 Mg Tabs (Ritonavir) .... Take 1 tablet by mouth once a day 7)  Aspirin 81 Mg  .... Take one tablet by mouth daily. 8)  Lisinopril 40 Mg Tabs (Lisinopril) .... Take 1 tablet by mouth once a day 9)  Zidovudine 300 Mg Tabs (Zidovudine) .... One tablet by mouth two times a day 10)  Atenolol 50 Mg Tabs (Atenolol) .... Take 1 tablet by mouth once a day 11)  Lantus 100 Unit/ml Soln (Insulin glargine) .... 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   Potential Barriers  Economic/Supplies  Diabetes Medications:  Comments: Performed CBg today in office without difficulty using his One Touch meter- it read HIGH- > 600. He also has a freestyle Lite meter with him today. Most readings on both meters recently are >300. He did not have lantus last  night. They were on their way to get lantus. was given 1cc syringes x20, 100 Freestyle strips and 10 One Touch strips and a delica lancing device with 30 lancets. Syringe reuse guidelines provided as patient does not have resources. Patient says he takes lantus at night when he has it becaue he doesn;t like the blurred vision it gives him when he doesn't take it.   Long Acting  Insulin Type:Lantus  Bedtime Dose: 60   Correction Factor: 1 unit:20 mg/dl Meals Coverage: calculated Total Daily Dose  ~ 60 (0.5units/kg) -140 (1.2 units/kg to account for Insulin resistance) units/day . correction and meal coverage calcualted using estimated TDD of 100 units/day. Suggest prandial insulin- since correction scale has not worked in past then could do set doses of  ~ 10 units one to three times daily before meals. Could start with Intensive insulin therpay "Lite with only 1 prandial injection in the morning. This would still leave  ~ 20 units or so for correction so patient would have improved control, but hopefully not lows and it may prevent ER and hospitla admits for hyperglycemia. Would be ideal to see what CBGs are after 3-5 days of consisitent lantus administration prior to adding this. Currently using Insulin Pump? No  Pump Settings:  Monitoring Self monitoring blood glucose 2 times a day Name of Meter  One Touch Ultra 2  Recent Episodes of: Requiring Help from another person  DKA: Yes Hyperglycemia : Yes HHNK:  Yes     Estimated /Usual Carb Intake Breakfast # of Carbs/Grams Did not have time to delve into today, however patient reports no appetite  Nutrition assessment Weight change: Loss Amount of change: documented 70 # weight loss is significant and suggests insulin deficiency What beverages do you drink?  reports mostly water nad diet soda, some sweet tea 3-4 glasses once daily when he eats out  Activity Limitations  Appropriate physical activity  Type of physical activity   Other  plays basketball 3-4 hours about 3 times a week Diabetes Disease Process  Discussed today  Medications State name-action-dose-duration-side effects-and time to take medication: Needs review/assistance   State appropriate timing of food related to medication: Needs review/assistance   Demonstrates/verbalizes site selection and rotation for injections Needs review/assistance   Correctly draw up and administer insulin-[place]etta-Symlin-glucagon: Needs review/assistance   State insulin adjustment guidelines: Not applicableDescribe safe needle/lancet disposal: Demonstrates competency    Nutritional Management  Monitoring    Perform glucose monitoring/ketone testing and record results correctly: Demonstrates competency     Complications State the causes-signs and symptoms and prevention of Hyperglycemia: Needs review/assistance Exercise Diabetes Management Education Done: 11/03/2009    BEHAVIORAL GOALS INITIAL Utilizing medications if for therapeutic effectiveness: take insulin as ordered Monitoring blood glucose levels daily: check blood sugar twice daily         Evidently has difficulty with adherence in past. Many ER visits nad hopsitalizations for hyperglycemia. Suggest he meet with Marlana Latus tobe able to get supplies at [facility]. will discuss plan of care with his primary care physican. Diabetes Self Management Support: home health and Bridge counseling case manager, ID office Follow-up:1 week

## 2010-05-08 NOTE — Progress Notes (Signed)
Summary: Norvir tablets  Phone Note Refill Request Message from:  Fax from Pharmacy on July 15, 2008 3:08 PM  change to Norvir tablets   Method Requested: Fax to [employer] Initial call taken by: Barbra Sarks RN,  July 15, 2008 3:08 PM    New/Updated Medications: * NORVIR TABLETS 100 MG Take 1 by mouth daily.                   Prescriptions: NORVIR TABLETS 100 MG Take 1 by mouth daily.  #30 x 6   Entered and Authorized by:   Barbra Sarks RN   Signed by:   Aldona Bar MD on 07/15/2008   Method used:   Faxed to ...       [facility] (retail)       [address] [place]  [address]       Ph: [phone]       Fax: [phone]   [id number]

## 2010-05-08 NOTE — Assessment & Plan Note (Signed)
Summary: F/U OV/VS   CC:  follow-up visit.  History of Present Illness: Pt claims that he took his insulin.  BS >600.  Pt has had multiple ED visits for high BS.  We do not have any regular insulin so will refer him to the ED fro treatment.  Pt transportated to ED.  Will refer [facility] to [facility] to see if we can get him Mediciad and get him into an assisted living facility.  Preventive Screening-Counseling & Management  Alcohol-Tobacco     Alcohol drinks/day: 0     Smoking Status: never  Caffeine-Diet-Exercise     Caffeine use/day: occasional coffee     Does Patient Exercise: yes     Type of exercise: walks     Exercise (avg: min/session): >60     Times/week: <3  Hep-HIV-STD-Contraception     HIV Risk: no     Sun Exposure-Excessive: occasionally  Safety-Violence-Falls     Seat Belt Use: 100      Drug Use:  no.    Comments: pt. declined condoms   Updated Prior Medication List: TRUVADA 200-300 MG TABS (EMTRICITABINE-TENOFOVIR) Take 1 tablet by mouth once a day REYATAZ 300 MG  CAPS (ATAZANAVIR SULFATE) Take 1 tablet by mouth once a day DAPSONE 100 MG  TABS (DAPSONE) Take 1 tablet by mouth once a day GLUCOPHAGE 1000 MG TABS (METFORMIN HCL) Take 1 tablet by mouth two times a day ANDROGEL 50 MG/5GM GEL (TESTOSTERONE) apply one packet q day NORVIR 100 MG TABS (RITONAVIR) Take 1 tablet by mouth once a day * ASPIRIN 81 MG Take one tablet by mouth daily. LISINOPRIL 40 MG TABS (LISINOPRIL) Take 1 tablet by mouth once a day ZIDOVUDINE 300 MG TABS (ZIDOVUDINE) one tablet by mouth two times a day ATENOLOL 50 MG TABS (ATENOLOL) Take 1 tablet by mouth once a day LANTUS 100 UNIT/ML SOLN (INSULIN GLARGINE) 60 units one time a day  Current Allergies (reviewed today): ! SULFA Past History:  Past Medical History: Last updated: 05/19/2008 HIV disease Diabetes mellitus, type II Hypertension  Review of Systems       The patient complains of anorexia.  The patient denies fever,  chest pain, and headaches.    Vital Signs:  Patient profile:   45 year old male Height:      69 inches (175.26 cm) Weight:      266.4 pounds (121.09 kg) BMI:     39.48 Temp:     97.4 degrees F (36.33 degrees C) oral Pulse rate:   92 / minute BP sitting:   128 / 82  (right arm)  Vitals Entered By: Myrtis Hopping CMA Deborra Medina) (October 27, 2009 10:43 AM) CC: follow-up visit Is Patient Diabetic? Yes Did you bring your meter with you today? No Pain Assessment Patient in pain? no      Nutritional Status BMI of > 30 = obese Nutritional Status Detail appetite "low" Comments no missed doses of meds per patient   Physical Exam  General:  alert, well-developed, well-nourished, and well-hydrated.   Head:  normocephalic and atraumatic.   Lungs:  normal breath sounds.      Impression & Recommendations:  Problem # 1:  HIV DISEASE (ICD-042) Will obtain labs.  I doubt patient taking his meds. Diagnostics Reviewed:  HIV: CDC-defined AIDS (04/13/2009)   CD4: 140 (07/20/2009)   WBC: 7.2 (07/20/2009)   Hgb: 13.2 (07/20/2009)   HCT: 39.6 (07/20/2009)   Platelets: 291 (07/20/2009) HIV genotype: See Comment (04/20/2009)   HIV-1  RNA: 430 (07/20/2009)   HBSAg: negative (11/21/2005)  Problem # 2:  DIABETES MELLITUS, TYPE II (ICD-250.00) sent to ED for BS >600 refer to DM educator. The following medications were removed from the medication list:    Novolin 70/30 70-30 % Susp (Insulin isophane & regular) .Marland Kitchen... Take 70 units in the morning and 70 units at night/ pt also uses sliding scale His updated medication list for this problem includes:    Glucophage 1000 Mg Tabs (Metformin hcl) .Marland Kitchen... Take 1 tablet by mouth two times a day    Lisinopril 40 Mg Tabs (Lisinopril) .Marland Kitchen... Take 1 tablet by mouth once a day    Lantus 100 Unit/ml Soln (Insulin glargine) .Marland KitchenMarland KitchenMarland KitchenMarland Kitchen 60 units one time a day  Orders: Diabetic Clinic Referral (Diabetic) T- Capillary Blood Glucose [id number])  Medications Added to  Medication List This Visit: 1)  Lantus 100 Unit/ml Soln (Insulin glargine) .... 60 units one time a day  Other Orders: T-CD4SP [facility]) (CD4SP) T-HIV Viral Load [phone]) T-Comprehensive Metabolic Panel ([id number]) T-CBC w/Diff [id number]) Est. Patient Level IV [id number])

## 2010-05-08 NOTE — Progress Notes (Signed)
Summary: [facility] calling   Phone Note Other Incoming   Caller: [facility] [phone] Summary of Call: Please call to verify correct dose of meropenem.  Prior to May 3 hospital discharge pt was taking  2grams q 8 hours , new orders state  meropenem 1 gram every 8 hours. Orland Mustard RN  Aug 21, 2009 4:36 PM   Follow-up for Phone Call        I had wanted it to be 2grams every 8 hours. Not sure if one of my parnters may have changed the dose. Not obvious from DC summary if this happened. Follow-up by: Alcide Evener MD,  Aug 21, 2009 8:37 PM  Additional Follow-up for Phone Call Additional follow up Details #1::        I changed him to 2g iv three times a day and extended for another 2 weeks. He needs to have followup for his infected hand. Claiborne Billings did you get a chance to look at Mr. Diagne hand when he came in on the 10th? If not I think he should be checked on by one of [place] as he finishes his antibiotics Additional Follow-up by: Alcide Evener MD,  Aug 22, 2009 2:24 PM    Additional Follow-up for Phone Call Additional follow up Details #2::    I did not. Follow-up by: Aldona Bar MD,  Aug 22, 2009 4:10 PM  Additional Follow-up for Phone Call Additional follow up Details #3:: Details for Additional Follow-up Action Taken: Tammy, can we work in Mr Sanderlin to be seen either with Dr. Tomma Lightning or myself or D clinic MD within next 2 wks? Additional Follow-up by: Alcide Evener MD,  Aug 22, 2009 4:45 PM

## 2010-05-08 NOTE — Progress Notes (Signed)
Summary: pt. update  Phone Note Other Incoming   Caller: [facility] Nurse Details for Reason: update on pt. Summary of Call: [facility], [facility] Nurse called stating pt. was trying to do better, but he is concerned about his sugar.  When checked last week from PICC it was over 1,000. [name] encouraged him to be checked at hospital, but pt. did not want to do this.  Pts. home # [phone]. Initial call taken by: Myrtis Hopping CMA Deborra Medina),  September 06, 2009 4:35 PM  Follow-up for Phone Call        If BS that high he needs to go to ED Follow-up by: Aldona Bar MD,  September 07, 2009 10:15 AM  Additional Follow-up for Phone Call Additional follow up Details #1::        spoke with patient and he agreed to go to ED, he said he would go within the hour. Additional Follow-up by: Myrtis Hopping CMA Deborra Medina),  September 07, 2009 10:24 AM

## 2010-05-08 NOTE — Miscellaneous (Signed)
Summary: Office Visit ([facility])    Vital Signs:  Patient profile:   45 year old male Weight:      300 pounds Temp:     98.1 degrees F oral Pulse rate:   93 / minute Pulse rhythm:   regular Resp:     18 per minute BP sitting:   126 / 80  (left arm)  Vitals Entered By: Barbra Sarks RN (May 25, 2009 3:51 PM) CC: HFU Is Patient Diabetic? Yes Did you bring your meter with you today? No Pain Assessment Patient in pain? no      CBG Result 378  Does patient need assistance? Functional Status Self care Ambulation Normal   CC:  HFU.  History of Present Illness: Pt hospitalized multiple times for hyperglycemia.  He admits that he has missed some of his meds. He was placed on AZT in addition to his other HIV meds which he has not filled.  Preventive Screening-Counseling & Management  Alcohol-Tobacco     Alcohol drinks/day: 0     Smoking Status: never  Caffeine-Diet-Exercise     Caffeine use/day: 0     Does Patient Exercise: yes     Type of exercise: walks     Exercise (avg: min/session): >60     Times/week: <3  Current Problems (verified): 1)  Paronychia, Finger  (ICD-681.02) 2)  Dermatophytosis of Unspecified Site  (ICD-110.9) 3)  Erectile Dysfunction  (ICD-302.72) 4)  Venereal Wart  (ICD-078.11) 5)  Hypertension  (ICD-401.9) 6)  Diabetes Mellitus, Type II  (ICD-250.00) 7)  HIV Disease  (ICD-042)  Current Medications (verified): 1)  Truvada 200-300 Mg Tabs (Emtricitabine-Tenofovir) .... Take 1 Tablet By Mouth Once A Day 2)  Reyataz 300 Mg  Caps (Atazanavir Sulfate) .... Take 1 Tablet By Mouth Once A Day 3)  Norvir 100 Mg Caps (Ritonavir) .... Take 1 Capsule By Mouth Once A Day 4)  Dapsone 100 Mg  Tabs (Dapsone) .... Take 1 Tablet By Mouth Once A Day 5)  Glucophage 1000 Mg Tabs (Metformin Hcl) .... Take 1 Tablet By Mouth Two Times A Day 6)  Androgel 50 Mg/5gm Gel (Testosterone) .... Apply One Packet Q Day 7)  Norvir Tablets 100 Mg .... Take 1 By Mouth  Daily. 8)  Podofilox 0.5 % Soln (Podofilox) .... Apply Twice A Day For 12 Hours Then Stop For 4 Days - May Repeat 3 Times 9)  Clotrimazole 1 % Crea (Clotrimazole) .... Apply Two Times A Day 10)  Viagra 50 Mg Tabs (Sildenafil Citrate) .... Use As Directed 11)  Lipitor 20 Mg .... Take One  Tablet By Mouth Daily. 12)  Novolin 70/30 70-30 % Susp (Insulin Isophane & Regular) .... 60 Units Before Breakfast and 50 Units Before Dinner 13)  Vicodin 5-500 Mg Tabs (Hydrocodone-Acetaminophen) .... Take 1 Tablet By Mouth Every 8 Hours As Needed 14)  Aspirin 81 Mg .... Take One Tablet By Mouth Daily. 15)  Lisinopril 40 Mg Tabs (Lisinopril) .... Take 1 Tablet By Mouth Once A Day 16)  Zidovudine 300 Mg Tabs (Zidovudine) .... One Tablet By Mouth Two Times A Day  Allergies: 1)  ! Sulfa    Physical Exam  General:  alert, well-hydrated, and overweight-appearing.   Head:  normocephalic and atraumatic.     Impression & Recommendations:  Problem # 1:  DIABETES MELLITUS, TYPE II (ICD-250.00) Discussed need to take all of his meds and monitor BS closely.  Talked about the bad outcomes from uncontrolled DM.  Referred to our DM  educator. He will return in 2 weeks. His updated medication list for this problem includes:    Glucophage 1000 Mg Tabs (Metformin hcl) .Marland Kitchen... Take 1 tablet by mouth two times a day    Novolin 70/30 70-30 % Susp (Insulin isophane & regular) .Marland KitchenMarland KitchenMarland KitchenMarland Kitchen 60 units before breakfast and 50 units before dinner    Lisinopril 40 Mg Tabs (Lisinopril) .Marland Kitchen... Take 1 tablet by mouth once a day  Problem # 2:  HIV DISEASE (ICD-042) Will obtain AZT through the [facility] program. He will need repeat labs in 6 weeks. Diagnostics Reviewed:  HIV: CDC-defined AIDS (04/13/2009)   CD4: 198 (04/14/2008)   WBC: 5.4 (04/13/2009)   Hgb: 11.6 (04/13/2009)   HCT: 36.5 (04/13/2009)   Platelets: 242 (04/13/2009) HIV genotype: See Comment (04/20/2009)   HIV-1 RNA: 4020 (04/13/2009)   HBSAg: negative (11/21/2005)   Patient  Instructions: 1)  Please schedule a follow-up appointment in 2 weeks. Prescriptions: ZIDOVUDINE 300 MG TABS (ZIDOVUDINE) one tablet by mouth two times a day  #60 x 5   Entered and Authorized by:   Aldona Bar MD   Signed by:   Aldona Bar MD on 05/25/2009   Method used:   Print then Give to Patient   RxID:   [id number]

## 2010-05-08 NOTE — Progress Notes (Signed)
  Phone Note Other Incoming   Caller: ER physician Reason for Call: Confirm/change Appt, Discuss lab or test results Action Taken: Appt scheduled Details for Reason: Patient with another ED visit secondary to hyperglycemia. Details of Request: Dr. Andy Gauss will like the patient to be seen by Diabetes educator after tx in the ER. Details of Action Taken: Request for emergent appoinment with DR made. Summary of Call: Patient in the ED secondary to hyperglycemia (CBG in the 800 range), no gap and no DKA . Apparently patient has been able to get his insulin now, but reports to be unable to administered it due to lack of knowledge and education on how to do it. He was seen by DR in 7/29 and she was planning to meet with him in 1 week. Please save a spot for him With Barnabas Harries today for more diabetes education today; he is going to be treated overnight with fluid resucitation and insulin in the Ed and he will come tot he clinic once he is release.

## 2010-05-08 NOTE — Miscellaneous (Signed)
Summary: [facility]: Orders  [facility]: Orders   Imported By: Bonner Puna 09/22/2009 10:52:00  _____________________________________________________________________  External Attachment:    Type:   Image     Comment:   External Document

## 2010-05-08 NOTE — Progress Notes (Signed)
Summary: Walk in requesting replacement of lost pain med scripts  Phone Note Other Incoming Call back at [place]: patient Summary of Call: Pt walked into clinic today stating he lost two of his prescriptions  given from his hospital discharge on yesterday. Percocet #40 per pt and PT/OT eval.   He stated he was told by the emergency room staff Dr Tomma Lightning would replace the scripts. The hospital discharge summary states he was given Percocet 5/325mg  #30 along with  eval for  PT/OT through [facility].   Initial call taken by: Orland Mustard RN,  Aug 09, 2009 3:25 PM  Follow-up for Phone Call        Per Dr Tomma Lightning pt was informed  his request for Percocet replacement was denied. We can honor the PT/OT referral given.  Pt stated he looked everywhere for the script and could not find it but he is in a lot of pain. He stated he was going back to the ED since they sent him to our office and said Dr Tomma Lightning would replace the meds. Orland Mustard RN  Aug 09, 2009 3:25 PM  Follow-up by: Aldona Bar MD,  Aug 09, 2009 4:24 PM

## 2010-05-08 NOTE — Miscellaneous (Signed)
Summary: [facility]  Clinical Lists Changes BC called pt today and reminded him to make an appointment with [facility] to get assistance with his medication. Pt did make an appointment but showed up almost an hour late and lied to [facility] 3 times about why he was late. Pt was not able to be seen today but has rescheduled for tomorrow after his meeting with Barnabas Harries at [facility]. BC called pt to remind him to bring his meter and all his medications to his appointments tomorrow. BC will follow up with pt in the morning to make sure that he attends all scheduled appointments.  [name]  November 02, 2009 5:07 PM

## 2010-05-08 NOTE — Progress Notes (Signed)
Summary: [facility] refill  Phone Note Refill Request Message from:  Fax from Pharmacy on April 24, 2009 3:42 PM  Refills Requested: Medication #1:  ASPIRIN 81 MG Take one tablet by mouth daily..   Dosage confirmed as above?Dosage Confirmed   Brand Name Necessary? No   Supply Requested: 1 month  Method Requested: Telephone to Pharmacy Initial call taken by: Barbra Sarks RN,  April 24, 2009 3:47 PM    New/Updated Medications: * ASPIRIN 81 MG Take one tablet by mouth daily. Prescriptions: ASPIRIN 81 MG Take one tablet by mouth daily.  #30 x 5   Entered by:   Barbra Sarks RN   Authorized by:   Aldona Bar MD   Signed by:   Aldona Bar MD on 04/24/2009   Method used:   Print then Give to Patient   RxID:   [id number]

## 2010-05-08 NOTE — Miscellaneous (Signed)
Summary: Office Visit ([facility])    Vital Signs:  Patient profile:   45 year old male Weight:      334 pounds Temp:     98.1 degrees F oral Pulse rate:   84 / minute Pulse rhythm:   regular Resp:     24 per minute BP sitting:   120 / 82  (left arm)  Vitals Entered By: Barbra Sarks RN (Aug 11, 2008 2:40 PM) CC: sore throat for 1 week, sore on penis Is Patient Diabetic? Yes  Pain Assessment Patient in pain? yes     Location: throat Intensity: 4 Type: aching CBG Result 164  Does patient need assistance? Functional Status Self care Ambulation Normal   CC:  sore throat for 1 week and sore on penis.  History of Present Illness: Pt continues to have a wart on his penis.  He has been using aldara without success.   He states he has been takng all of his meds and following a diabetic diet.  Preventive Screening-Counseling & Management     Alcohol drinks/day: 0     Smoking Status: never     Caffeine use/day: 1     Does Patient Exercise: yes     Type of exercise: walks     Exercise (avg: min/session): >60     Times/week: 7  Allergies: 1)  ! Sulfa  Past History:  Past Medical History:    HIV disease    Diabetes mellitus, type II    Hypertension     (05/19/2008)   Review of Systems  The patient denies anorexia, fever, and weight loss.     Physical Exam  General:  alert, well-developed, well-nourished, well-hydrated, and overweight-appearing.   Head:  normocephalic, atraumatic, and male-pattern balding.   Mouth:  pharynx pink and moist.  no thrush  Lungs:  normal breath sounds.   Rectal:  dime size wart on penis    Impression & Recommendations:  Problem # 1:  HIV DISEASE (ICD-042) Obtain labs today.  pt to f/u in 3 months. Diagnostics Reviewed:  CD4: [id number] (04/14/2008)   WBC: 6.0 (04/15/2008)   Hgb: 13.5 (04/15/2008)   HCT: 41.6 (04/15/2008)   Platelets: 199 (04/15/2008) HIV-1 RNA: <48 copies/mL (04/15/2008)   HBSAg: negative  (11/21/2005)  Problem # 2:  VENEREAL WART (ICD-078.11) try podiflox  Problem # 3:  DIABETES MELLITUS, TYPE II (ICD-250.00) check HgbA1c His updated medication list for this problem includes:    Glucophage 1000 Mg Tabs (Metformin hcl) .Marland Kitchen... Take 1 tablet by mouth two times a day    Glucotrol 10 Mg Tabs (Glipizide) .Marland Kitchen... Take 1 tablet by mouth once a day    Lisinopril 20 Mg Tabs (Lisinopril) .Marland Kitchen... Take 1 tablet by mouth once a day  Medications Added to Medication List This Visit: 1)  Podofilox 0.5 % Soln (Podofilox) .... Apply twice a day for 12 hours then stop for 4 days - may repeat 3 times  Other Orders: T-CD4 [id number]) T-HIV Viral Load [phone]) T-Comprehensive Metabolic Panel (A999333) T-CBC w/Diff [id number]) Est. Patient Level III [id number])   Prescriptions: PODOFILOX 0.5 % SOLN (PODOFILOX) apply twice a day for 12 hours then stop for 4 days - may repeat 3 times  #3.4ml x 0   Entered and Authorized by:   Aldona Bar MD   Signed by:   Aldona Bar MD on 08/11/2008   Method used:   Print then Give to Patient   RxID:   [id number]

## 2010-05-08 NOTE — Progress Notes (Signed)
Summary: phone call  Phone Note Call from Patient   Reason for Call: Talk to Nurse Summary of Call: patient wanted to reschedule eligibility appointment today..states he is sick. Hx of diabetes and HIV.Marland KitchenMarland KitchenPatient states he doesn't feel like catching the bus.Marland KitchenMarland KitchenIs drinking alot of water and frequent urination.Marland KitchenMarland KitchenPatient does not have meter to check CBG.Bonne Dolores to get patient to find a way to come in to day.Marland KitchenMarland KitchenHe refused.Marland KitchenMarland KitchenMarland KitchenAppointment made with Dr. Tomma Lightning on Thursday...if patient cannot make it in today...patient states Mom is checking in on him.Marland KitchenMarland KitchenAdvised patient to call if worsening condition or call 911... Initial call taken by: Verne Spurr,  December 26, 2008 2:27 PM

## 2010-05-08 NOTE — Progress Notes (Signed)
Summary: Questions re: insulin   Phone Note From Other Clinic   Caller: [facility] nurse  [phone] Summary of Call: Nurse needs information regarding humalog sliding scale.  The patient does not have a sliding scale for dosing.  Please advise .  Follow-up for Phone Call        pt needs appt  I have not seen patient to be able to make that determination Follow-up by: Aldona Bar MD,  July 14, 2009 10:35 AM

## 2010-05-08 NOTE — Progress Notes (Signed)
Summary: refill/mld  Phone Note Refill Request      Patient is meeting with [facility] for adherence on his medications.  Kim asked if we could send refills on his Lisinopril and Atenolol to [facility] on [address].Prescriptions: ZIDOVUDINE 300 MG TABS (ZIDOVUDINE) one tablet by mouth two times a day  #60 x 5   Entered by:   Canary Brim  BS,CPht II,MPH   Authorized by:   Aldona Bar MD   Signed by:   Canary Brim  BS,CPht II,MPH on 11/06/2009   Method used:   Electronically to        [facility] [phone]* (retail)       [address]       [place], [place]  [address]       Ph: [phone]       Fax:    RxIDNZ:[id number] NORVIR 100 MG TABS (RITONAVIR) Take 1 tablet by mouth once a day  #30 x 5   Entered by:   Canary Brim  BS,CPht II,MPH   Authorized by:   Aldona Bar MD   Signed by:   Canary Brim  BS,CPht II,MPH on 11/06/2009   Method used:   Electronically to        [facility] [phone]* (retail)       [address]       [place], [place]  [address]       Ph: [phone]       Fax:    RxIDET:[id number] DAPSONE 100 MG  TABS (DAPSONE) Take 1 tablet by mouth once a day  #30 x 5   Entered by:   Canary Brim  BS,CPht II,MPH   Authorized by:   Aldona Bar MD   Signed by:   Canary Brim  BS,CPht II,MPH on 11/06/2009   Method used:   Electronically to        [facility] [phone]* (retail)       [address]       [place], [place]  [address]       Ph: [phone]       Fax:    RxIDVF:[id number] REYATAZ 300 MG  CAPS (ATAZANAVIR SULFATE) Take 1 tablet by mouth once a day  #30 x 5   Entered by:   Canary Brim  BS,CPht II,MPH   Authorized by:   Aldona Bar MD   Signed by:   Canary Brim  BS,CPht II,MPH on 11/06/2009   Method used:   Electronically to        [facility] [phone]* (retail)       [address]       [place], [place]  [address]       Ph: [phone]       Fax:    RxIDOI:[id number] TRUVADA 200-300 MG TABS (EMTRICITABINE-TENOFOVIR) Take 1 tablet by mouth once a  day  #30 x 5   Entered by:   Canary Brim  BS,CPht II,MPH   Authorized by:   Aldona Bar MD   Signed by:   Canary Brim  BS,CPht II,MPH on 11/06/2009   Method used:   Electronically to        [facility] [phone]* (retail)       [place], [place]  [address]       Ph: [phone]       Fax:    RxIDUZ:[id number] ATENOLOL 50 MG TABS (ATENOLOL) Take 1 tablet by mouth  once a day  #30 x 5   Entered by:   Canary Brim  BS,CPht II,MPH   Authorized by:   Aldona Bar MD   Signed by:   Canary Brim  BS,CPht II,MPH on 11/06/2009   Method used:   Electronically to        [facility] [phone]* (retail)       [place], [place]  [address]       Ph: [phone]       Fax: [phone]   RxIDTX:[id number] LISINOPRIL 40 MG TABS (LISINOPRIL) Take 1 tablet by mouth once a day  #30 x 5   Entered by:   Canary Brim  BS,CPht II,MPH   Authorized by:   Aldona Bar MD   Signed by:   Canary Brim  BS,CPht II,MPH on 11/06/2009   Method used:   Electronically to        [facility] [phone]* (retail)       [address]       [place],   [address]       Ph: [phone]       Fax: [phone]   RxID:   [id number]  Canary Brim  BS,CPht II,MPH  November 06, 2009 3:54 PM

## 2010-05-08 NOTE — Miscellaneous (Signed)
Summary: [facility]: Orders  [facility]: Orders   Imported By: Bonner Puna 08/22/2009 10:54:39  _____________________________________________________________________  External Attachment:    Type:   Image     Comment:   External Document

## 2010-05-08 NOTE — Progress Notes (Signed)
Summary: Sample of Lantus given  the homehealth nurses [name] and Suanne Marker came by to pick up a sample of Lantus insulin for patient who was discharged from the hospital. Patient will be taking 20 units at bedtime.  1 vial was given.  Sample Given, Lot #:[id number] Lantus 100 units/ml 1 vial Expiration Date:07/12 Patient has been instructed regarding the correct time, dose and frequency of taking this med, including desired effects and most common side effects. Keith [name]  BS,CPht II,MPH  September 15, 2009 4:07 PM

## 2010-05-08 NOTE — Miscellaneous (Signed)
Summary: Office Visit ([facility])    Vital Signs:  Patient profile:   45 year old male Weight:      337.5 pounds Temp:     97.8 degrees F oral Pulse rate:   78 / minute Pulse rhythm:   regular Resp:     20 per minute BP sitting:   137 / 91  (left arm)  Vitals Entered By: Barbra Sarks RN (December 08, 2008 9:25 AM) CC: f/u 05 Is Patient Diabetic? Yes  Pain Assessment Patient in pain? no      CBG Result 218  Does patient need assistance? Functional Status Self care Ambulation Normal   CC:  f/u 05.  History of Present Illness: Pt here for f/u.  He has not been exercising as much as he had been and has gained some weight.  he still has the wart on his penis but has not been using the podophilox.  Preventive Screening-Counseling & Management  Alcohol-Tobacco     Alcohol drinks/day: 0     Smoking Status: never  Caffeine-Diet-Exercise     Caffeine use/day: 0     Does Patient Exercise: yes     Type of exercise: walks     Exercise (avg: min/session): >60     Times/week: <3  Problems Prior to Update: 1)  Dermatophytosis of Unspecified Site  (ICD-110.9) 2)  Erectile Dysfunction  (ICD-302.72) 3)  Venereal Wart  (ICD-078.11) 4)  Hypertension  (ICD-401.9) 5)  Diabetes Mellitus, Type II  (ICD-250.00) 6)  HIV Disease  (ICD-042)  Allergies: 1)  ! Sulfa   Review of Systems  The patient denies anorexia, fever, and abdominal pain.     Physical Exam  General:  alert, well-hydrated, and overweight-appearing.   Head:  normocephalic and atraumatic.   Mouth:  pharynx pink and moist.   Lungs:  normal breath sounds.      Impression & Recommendations:  Problem # 1:  HIV DISEASE (ICD-042) Will obtain labs today and have him f/u in 2 weeks.  He is to continue his current regimen. Diagnostics Reviewed:  CD4: 198 (04/14/2008)   WBC: 7.9 (08/11/2008)   Hgb: 13.6 (08/11/2008)   HCT: 41.7 (08/11/2008)   Platelets: 250 (08/11/2008) HIV-1 RNA: 231 (08/11/2008)    HBSAg: negative (11/21/2005)  Problem # 2:  DIABETES MELLITUS, TYPE II (ICD-250.00) encouraged diet and exercise. Will check his HgbA1c today. His updated medication list for this problem includes:    Glucophage 1000 Mg Tabs (Metformin hcl) .Marland Kitchen... Take 1 tablet by mouth two times a day    Glucotrol 10 Mg Tabs (Glipizide) .Marland Kitchen... Take 1 tablet by mouth once a day    Lisinopril 20 Mg Tabs (Lisinopril) .Marland Kitchen... Take 1 tablet by mouth once a day  Problem # 3:  VENEREAL WART (ICD-078.11) continue podophylox  Medications Added to Medication List This Visit: 1)  Viagra 50 Mg Tabs (Sildenafil citrate) .... Use as directed  Other Orders: T-CD4SP ([facility]) (CD4SP) T-HIV Viral Load [phone]) T-Comprehensive Metabolic Panel (A999333) T-CBC w/Diff [id number]) T-Lipid Profile [id number]) Est. Patient Level III SJ:[id number])    Patient Instructions: 1)  Please schedule a follow-up appointment in 2 weeks. Prescriptions: VIAGRA 50 MG TABS (SILDENAFIL CITRATE) use as directed  #10 x 5   Entered and Authorized by:   Aldona Bar MD   Signed by:   Aldona Bar MD on 12/08/2008   Method used:   Print then Give to Patient   RxID:   [id number] PODOFILOX 0.5 % SOLN (PODOFILOX) apply  twice a day for 12 hours then stop for 4 days - may repeat 3 times  #3.77ml x 3   Entered and Authorized by:   Aldona Bar MD   Signed by:   Aldona Bar MD on 12/08/2008   Method used:   Print then Give to Patient   RxID:   [id number]   Appended Document: Office Visit ([facility])     Clinical Lists Changes  Observations: Added new observation of HGBA1C: 7.3 % (12/14/2008 12:03)      Laboratory Results   Blood Tests   Date/Time Received: 12/08/08  HGBA1C: 7.3%   (Normal Range: Non-Diabetic - 3-6%   Control Diabetic - 6-8%)

## 2010-05-08 NOTE — Assessment & Plan Note (Signed)
Summary: HFU/VS   CC:  follow-up visit, B/P elevated, and pt feels like his glucose is elevated.  History of Present Illness: Pt recently hospitalized for an abcess in his left thumb.  He feels like his BS is up but he did not check i this morning. He has been more thirsty than usual and urinating more than usual. He also thinks his BP is up.  He states that he has been taking his meds as prescribed every day.  He feels weak.  Preventive Screening-Counseling & Management  Alcohol-Tobacco     Alcohol drinks/day: 0     Smoking Status: never  Caffeine-Diet-Exercise     Caffeine use/day: 0     Does Patient Exercise: yes     Type of exercise: walks     Exercise (avg: min/session): >60     Times/week: <3  Hep-HIV-STD-Contraception     HIV Risk: no     Sun Exposure-Excessive: occasionally  Safety-Violence-Falls     Seat Belt Use: 100      Drug Use:  no.     Updated Prior Medication List: TRUVADA 200-300 MG TABS (EMTRICITABINE-TENOFOVIR) Take 1 tablet by mouth once a day REYATAZ 300 MG  CAPS (ATAZANAVIR SULFATE) Take 1 tablet by mouth once a day NORVIR 100 MG CAPS (RITONAVIR) Take 1 capsule by mouth once a day DAPSONE 100 MG  TABS (DAPSONE) Take 1 tablet by mouth once a day GLUCOPHAGE 1000 MG TABS (METFORMIN HCL) Take 1 tablet by mouth two times a day ANDROGEL 50 MG/5GM GEL (TESTOSTERONE) apply one packet q day * NORVIR TABLETS 100 MG Take 1 by mouth daily. PODOFILOX 0.5 % SOLN (PODOFILOX) apply twice a day for 12 hours then stop for 4 days - may repeat 3 times CLOTRIMAZOLE 1 % CREA (CLOTRIMAZOLE) apply two times a day VIAGRA 50 MG TABS (SILDENAFIL CITRATE) use as directed * LIPITOR 20 MG Take one  tablet by mouth daily. NOVOLIN 70/30 70-30 % SUSP (INSULIN ISOPHANE & REGULAR) 60 units before breakfast and 50 units before dinner VICODIN 5-500 MG TABS (HYDROCODONE-ACETAMINOPHEN) Take 1 tablet by mouth every 8 hours as needed * ASPIRIN 81 MG Take one tablet by mouth  daily. LISINOPRIL 40 MG TABS (LISINOPRIL) Take 1 tablet by mouth once a day ZIDOVUDINE 300 MG TABS (ZIDOVUDINE) one tablet by mouth two times a day ATENOLOL 50 MG TABS (ATENOLOL) Take 1 tablet by mouth once a day  Current Allergies (reviewed today): ! SULFA Past History:  Past Medical History: Last updated: 05/19/2008 HIV disease Diabetes mellitus, type II Hypertension  Review of Systems  The patient denies anorexia, fever, chest pain, and headaches.    Vital Signs:  Patient profile:   45 year old male Height:      69 inches (175.26 cm) Weight:      272.8 pounds (124 kg) BMI:     40.43 Temp:     97.4 degrees F (36.33 degrees C) oral Pulse rate:   86 / minute BP sitting:   156 / 125  (left arm)  Vitals Entered By: Myrtis Hopping CMA Deborra Medina) (July 20, 2009 9:20 AM) CC: follow-up visit, B/P elevated, pt feels like his glucose is elevated Is Patient Diabetic? Yes Did you bring your meter with you today? No Pain Assessment Patient in pain? yes     Location: right thumb Intensity: 10 Type: throbbing Onset of pain  Constant Nutritional Status BMI of > 30 = obese Nutritional Status Detail appetite "not good"  Does patient need assistance?  Functional Status Self care Ambulation Normal Comments no missed doses of meds per patient   Physical Exam  General:  alert, well-developed, well-nourished, and well-hydrated.   Head:  normocephalic and atraumatic.   Mouth:  pharynx pink and moist.   Lungs:  normal breath sounds.   Msk:  left thumb slightly swollen s/p I&D without erythema or warmth   Impression & Recommendations:  Problem # 1:  PARONYCHIA, FINGER (ICD-681.02) S/P I&D. F/u with surgeon as scheduled.  Problem # 2:  DIABETES MELLITUS, TYPE II (ICD-250.00) check stat b-met. -unable to get FS.  If high will send to ED. His updated medication list for this problem includes:    Glucophage 1000 Mg Tabs (Metformin hcl) .Marland Kitchen... Take 1 tablet by mouth two times  a day    Novolin 70/30 70-30 % Susp (Insulin isophane & regular) .Marland KitchenMarland KitchenMarland KitchenMarland Kitchen 60 units before breakfast and 50 units before dinner    Lisinopril 40 Mg Tabs (Lisinopril) .Marland Kitchen... Take 1 tablet by mouth once a day  Orders: T-Basic Metabolic Panel (99991111)  Problem # 3:  HYPERTENSION (ICD-401.9) add atenolol His updated medication list for this problem includes:    Lisinopril 40 Mg Tabs (Lisinopril) .Marland Kitchen... Take 1 tablet by mouth once a day    Atenolol 50 Mg Tabs (Atenolol) .Marland Kitchen... Take 1 tablet by mouth once a day  Problem # 4:  HIV DISEASE (ICD-042) Will check labs.  Given his multiple medical problems and hsi difficulty taking care of himself I have talked to him about entering an assisted living facility. Orders: T-CBC w/Diff [id number]) T-CD4SP ([facility]) (CD4SP) T-HIV Viral Load [phone]) Est. Patient Level IV [id number])  Medications Added to Medication List This Visit: 1)  Atenolol 50 Mg Tabs (Atenolol) .... Take 1 tablet by mouth once a day  Patient Instructions: 1)  Please schedule a follow-up appointment in 2 weeks. Prescriptions: ATENOLOL 50 MG TABS (ATENOLOL) Take 1 tablet by mouth once a day  #30 x 5   Entered and Authorized by:   Aldona Bar MD   Signed by:   Aldona Bar MD on 07/20/2009   Method used:   Print then Give to Patient   RxID:   [id number]

## 2010-05-08 NOTE — Letter (Signed)
Summary: Generic Letter  [facility]  [address]   [place], [place] [address]   Phone: [phone]  Fax: [phone]          04/27/2009    RE: Keith [name] (DOB [birth year])    To Whom It May Concern:  Keith [name] is a patient of mine. In my opinion, he is unable to work at this time due to his uncontrolled Diabetes, HIV infection and Hypertension.   Sincerely,   Aldona Bar MD

## 2010-05-08 NOTE — Miscellaneous (Signed)
Summary: Keith [name] Update  Clinical Lists Changes  Observations: Added new observation of PAYOR: No Insurance (07/18/2008 15:30) Added new observation of RWTITLE: B (07/18/2008 15:30) Added new observation of AIDSDAP: Yes 2008 (07/18/2008 15:30) Added new observation of PCTFPL: 117  (07/18/2008 15:30) Added new observation of INCOMESOURCE: EMP  (07/18/2008 15:30) Added new observation of HOUSEINCOME: 42595  (07/18/2008 15:30) Added new observation of FAMILYSIZE: 1  (07/18/2008 15:30) Added new observation of HOUSING: Stable/permanent  (07/18/2008 15:30) Added new observation of HIV RISK BEH: Unknown  (07/18/2008 15:30) Added new observation of CASE MGM: Allyson  (07/18/2008 15:30) Added new observation of INFECTDIS MD: Tomma Lightning  (07/18/2008 15:30) Added new observation of DATE1STVISIT: 01/16/1999  (07/18/2008 15:30) Added new observation of HIV INIT DX: 01/07/1999  (07/18/2008 15:30) Added new observation of GENDER: Male  (07/18/2008 15:30) Added new observation of MARITAL STAT: Single  (07/18/2008 15:30) Added new observation of LATINO/HISP: No  (07/18/2008 15:30) Added new observation of RACE: African American  (07/18/2008 15:30) Added new observation of RW VITAL STA: Active  (07/18/2008 15:30) Added new observation of PATNTCOUNTY: [place]  (07/18/2008 15:30) Added new observation of RWPARTICIP: Yes  (07/18/2008 15:30) Added new observation of HEP C AB: negative  (07/18/2008 15:30) Added new observation of HEP_C_DATE: 01/16/99  (07/18/2008 15:30) Added new observation of CD4 COUNT: 198 microliters (04/14/2008 15:30) Added new observation of ANTI-HBS: negative  (11/21/2005 15:30) Added new observation of HBS_AB_DATE: 11/21/05  (11/21/2005 15:30) Added new observation of HBSAG: negative  (11/21/2005 15:30) Added new observation of HBS_AG_DATE: 11/21/05  (11/21/2005 15:30) Added new observation of HEP_A_DATE: neagtive  (11/21/2005 15:30)

## 2010-05-08 NOTE — Miscellaneous (Signed)
Summary: Bridge Counselor  Clinical Lists Changes BC went with pt to see Barnabas Harries today. Pt does know how to check his BS and how to inject his insulin. Pt obtained his insulin through emergency funds at [facility]. BC supplied pt with syringes and alcohol pads. Pt was given a lancet device, lancets, test strips and a case from [employer]. Pt is not taking his HIV medications or any other meds. BC will provide Tx adherence with pt on Monday following his mental health evaluation.   Keith [name]  November 03, 2009 2:44 PM

## 2010-05-08 NOTE — Progress Notes (Signed)
Summary: pain med request  Phone Note Call from Patient   Reason for Call: Refill Medication Summary of Call: Pt. called getting ready to be d/c' d from the hospital for hyperglycemia. He states he is having severe pain in his left index finger and would like a refill on Oxydodone 5 mg take 2 tablets every 6 hrs for pain #28. Dr. Sanjuana Letters gave pt. the first rx on 01/28/09. Initial call taken by: Barbra Sarks RN,  March 06, 2009 10:13 AM  Follow-up for Phone Call        that is a Rx that can not be called in. If he is still in the hospital he could request if from one of the MDs there. If not we could call vicodin in. Follow-up by: Aldona Bar MD,  March 06, 2009 10:33 AM    Patient returned RN call at 11am and has already been discharged from the hospital. He states he doses not want Vicodin called in . He states he tried calling Dr. Sanjuana Letters and he is not available, he sees him again on 03/22/09.He insists on Oxydodone 5 mg because that alleviatees the pain and does not want anything that is not as strong. Pt. advised  Appended Document: pain med request Patient adv ised he needs to keep his appt. this afternoon to discuss this with Dr. Tomma Lightning. Barbra Sarks RN

## 2010-05-08 NOTE — Assessment & Plan Note (Signed)
Summary: f/u OV   CC:  follow-up visit, needs refill on Glucophage, and c/o no appetite.  History of Present Illness: Pt here for disability letter. He states that he has been taking his meds but was recently in the hospital with a BS > 1000 and a HgbA1c of 18.  I spoke to him at length about need for compliance with meds.  Pt swears that he has been taking them.  Preventive Screening-Counseling & Management  Alcohol-Tobacco     Alcohol drinks/day: 0     Smoking Status: never  Caffeine-Diet-Exercise     Caffeine use/day: occasional coffee     Does Patient Exercise: yes     Type of exercise: walks     Exercise (avg: min/session): >60     Times/week: <3  Hep-HIV-STD-Contraception     HIV Risk: no     Sun Exposure-Excessive: occasionally  Safety-Violence-Falls     Seat Belt Use: 100      Drug Use:  no.    Comments: pt. given condoms   Updated Prior Medication List: TRUVADA 200-300 MG TABS (EMTRICITABINE-TENOFOVIR) Take 1 tablet by mouth once a day REYATAZ 300 MG  CAPS (ATAZANAVIR SULFATE) Take 1 tablet by mouth once a day DAPSONE 100 MG  TABS (DAPSONE) Take 1 tablet by mouth once a day GLUCOPHAGE 1000 MG TABS (METFORMIN HCL) Take 1 tablet by mouth two times a day ANDROGEL 50 MG/5GM GEL (TESTOSTERONE) apply one packet q day NORVIR 100 MG TABS (RITONAVIR) Take 1 tablet by mouth once a day * ASPIRIN 81 MG Take one tablet by mouth daily. LISINOPRIL 40 MG TABS (LISINOPRIL) Take 1 tablet by mouth once a day ZIDOVUDINE 300 MG TABS (ZIDOVUDINE) one tablet by mouth two times a day ATENOLOL 50 MG TABS (ATENOLOL) Take 1 tablet by mouth once a day LANTUS 100 UNIT/ML SOLN (INSULIN GLARGINE) 60 units one time a day  Current Allergies (reviewed today): ! SULFA Past History:  Past Medical History: Last updated: 05/19/2008 HIV disease Diabetes mellitus, type II Hypertension  Review of Systems       The patient complains of anorexia.  The patient denies fever, weight loss, and  abdominal pain.    Vital Signs:  Patient profile:   45 year old male Height:      69 inches (175.26 cm) Weight:      263.8 pounds (122.18 kg) BMI:     39.10 Temp:     97.5 degrees F (36.39 degrees C) oral Pulse rate:   70 / minute BP sitting:   129 / 84  (left arm)  Vitals Entered By: Myrtis Hopping CMA Deborra Medina) (December 21, 2009 9:38 AM) CC: follow-up visit, needs refill on Glucophage, c/o no appetite Is Patient Diabetic? Yes Did you bring your meter with you today? No Pain Assessment Patient in pain? no      Nutritional Status BMI of > 30 = obese Nutritional Status Detail appetite "low"  Does patient need assistance? Functional Status Self care Ambulation Normal Comments no missed doses of meds per patient   Physical Exam  General:  alert, well-developed, well-nourished, and well-hydrated.   Head:  normocephalic and atraumatic.   Mouth:  pharynx pink and moist.   Lungs:  normal breath sounds.   Extremities:  no sign of cellulitis   Impression & Recommendations:  Problem # 1:  HIV DISEASE (ICD-042) Will chack labs today.  Encouraged to take his meds. Orders: Est. Patient Level IV [id number]) T- Capillary Blood Glucose [id number]) T-CD4SP ([facility]) (CD4SP) T-HIV Viral Load [phone])  Diagnostics Reviewed:  HIV: CDC-defined AIDS (04/13/2009)   CD4: 100 (10/27/2009)   WBC: 4.3 (10/27/2009)   Hgb: 11.5 (10/27/2009)   HCT: 34.2 (10/27/2009)   Platelets: 168 (10/27/2009) HIV genotype: See Comment (04/20/2009)   HIV-1 RNA: 8910 (10/27/2009)   HBSAg: negative (11/21/2005)  Problem # 2:  DIABETES MELLITUS, TYPE II (ICD-250.00) uncontrolled 10 untis of regular insulin now refer back to [facility] encouraged compliance with meds. His updated medication list for this problem includes:    Glucophage 1000 Mg Tabs (Metformin hcl) .Marland Kitchen... Take 1 tablet by mouth two times a day    Lisinopril 40 Mg Tabs (Lisinopril) .Marland Kitchen... Take 1 tablet by mouth once a day    Lantus 100 Unit/ml  Soln (Insulin glargine) .Marland KitchenMarland KitchenMarland KitchenMarland Kitchen 60 units one time a day  Orders: Est. Patient Level IV (123XX123) T-Basic Metabolic Panel (99991111) Insulin 5 units [id number]) Admin of Therapeutic Inj  intramuscular or subcutaneous [id number])  Patient Instructions: 1)  Please schedule a follow-up appointment in 2 weeks.  Prevention & Chronic Care Immunizations   Influenza vaccine: Fluvax Non-MCR  (01/05/2009)    Tetanus booster: Not documented    Pneumococcal vaccine: Pneumovax  (09/08/2006)  Other Screening   PSA: Not documented   Smoking status: never  (12/21/2009)  Diabetes Mellitus   HgbA1C: 19.8 in hospital  (08/07/2009)    Eye exam: Not documented    Foot exam: Not documented   High risk foot: Not documented   Foot care education: Not documented    Urine microalbumin/creatinine ratio: Not documented  Lipids   Total Cholesterol: 239 in hospital  (08/07/2009)   LDL: 159  (12/08/2008)   LDL Direct: Not documented   HDL: 41 in hospital  (08/07/2009)   Triglycerides: 518 in hospital  (08/07/2009)  Hypertension   Last Blood Pressure: 129 / 84  (12/21/2009)   Serum creatinine: 1.10  (10/27/2009)   Serum potassium 4.4  (10/27/2009)  Self-Management Support :    Diabetes self-management support: Not documented   Last diabetes self-management training by diabetes educator: 12/08/2009    Hypertension self-management support: Not documented    Medication Administration  Injection # 1:    Medication: Insulin 5 units    Diagnosis: DIABETES MELLITUS, TYPE II (ICD-250.00)    Route: SQ    Site: R subcutaneous    Exp Date: 07-13    Lot #: [id number]    Comments: Pt given 10 untis of Regular insulin r posterior arm subcutaneous.    Patient tolerated injection without complications    Given by: Orland Mustard RN (December 21, 2009 10:10 AM)  Orders Added: 1)  Est. Patient Level IV [99214] 2)  T- Capillary Blood Glucose [82948] 3)  T-CD4SP [facility]) [CD4SP] 4)  T-HIV Viral Load  [phone] 5)  T-Basic Metabolic Panel 0000000 6)  Insulin 5 units [J1815] 7)  Admin of Therapeutic Inj  intramuscular or subcutaneous [id number]

## 2010-05-08 NOTE — Progress Notes (Signed)
Summary: Novolin and Metformin refills  Phone Note Refill Request Message from:  Fax from Pharmacy on April 21, 2009 12:19 PM  Refills Requested: Medication #1:  NOVOLIN 70/30 70-30 % SUSP 42 units before breakfast and 36 units before dinner   Dosage confirmed as above?Dosage Confirmed   Brand Name Necessary? No   Supply Requested: 1 month   Last Refilled: 02/13/2009  Medication #2:  GLUCOPHAGE 1000 MG TABS Take 1 tablet by mouth two times a day   Dosage confirmed as above?Dosage Confirmed   Brand Name Necessary? No   Supply Requested: 1 month   Last Refilled: 02/10/2009 Also requesting ASA 81 mg daily.   Method Requested: Telephone to Pharmacy Next Appointment Scheduled: 04/27/09 Initial call taken by: Barbra Sarks RN,  April 21, 2009 12:22 PM    Prescriptions: NOVOLIN 70/30 70-30 % SUSP (INSULIN ISOPHANE & REGULAR) 42 units before breakfast and 36 units before dinner  #2 bottles x 5   Entered by:   Barbra Sarks RN   Authorized by:   Aldona Bar MD   Signed by:   Aldona Bar MD on 04/21/2009   Method used:   Telephoned to ...       [facility] (retail)       [place], [place]  [address]       Ph: [phone]       Fax: [phone]   RxID:   [id number] GLUCOPHAGE 1000 MG TABS (METFORMIN HCL) Take 1 tablet by mouth two times a day  #60 x 5   Entered by:   Barbra Sarks RN   Authorized by:   Aldona Bar MD   Signed by:   Aldona Bar MD on 04/21/2009   Method used:   Telephoned to ...       [facility] (retail)       [address] [place], [place]  [address]       Ph: [phone]       Fax: [phone]   RxID:   [id number]

## 2010-05-08 NOTE — Miscellaneous (Signed)
Summary: HIPAA Restrictions  HIPAA Restrictions   Imported By: Bonner Puna 07/20/2009 15:40:56  _____________________________________________________________________  External Attachment:    Type:   Image     Comment:   External Document

## 2010-05-08 NOTE — Miscellaneous (Signed)
Summary: Bridge Counselor  Clinical Lists Changes BC faxed pt's Rx assistance application today for his insulin. BC was told that it will take about two weeks to process the application and ship the medication. Every shipment of insulin will come to [facility].  Maudie [facility]  November 02, 2009 5:00 PM

## 2010-05-08 NOTE — Progress Notes (Signed)
  Phone Note Other Incoming   Caller: [facility] Reason for Call: Discuss lab or test results Summary of Call: Lab called with critical value on CBG  ~950. Pt already has been to ED and his CBG is normalised. Pt was discharged home by ED physician.  Initial call taken by: Pershing Cox MD,  October 27, 2009 9:49 PM

## 2010-05-08 NOTE — Miscellaneous (Signed)
Summary: Bridge Counselor  Clinical Lists Changes BC made a home visit with pt today. BC discussed with pt him not being truthful with [facility] and other providers. Pt denies every lie that [facility] has caught pt in. Pt's BS was >600 today and pt reports that he took his insulin this morning. However, the insulin that pt picked up yesterday from [facility] was still shrink wrapped. Pt did not have all reported BS levels recorded in his meter. Pt's last BS check was 11/12/09. Pt has an appointment with Barnabas Harries on Friday for more diabetes education. Pt also had an empty pillbox today was was unable to locate all of his pill bottles. Pt is not taking his medications appropriately. Pt needs intensive Tx adherence. His mental health appointment has been rescheduled for 12/14/09.   [name]  December 06, 2009 12:52 PM

## 2010-05-08 NOTE — Progress Notes (Signed)
Summary: rx for Lipitor  Phone Note Outgoing Call Call back at [facility] Phone [phone]   Call placed by: Barbra Sarks RN,  December 13, 2008 5:12 PM Call placed to: Patient Summary of Call: Pt. adviised of elevated cholesterol and that MD wants him to start Lipitor 20 mg daily. Will call to [facility]. Initial call taken by: Barbra Sarks RN,  December 13, 2008 5:14 PM    New/Updated Medications: * LIPITOR 20 MG Take one  tablet by mouth daily. Prescriptions: LIPITOR 20 MG Take one  tablet by mouth daily.  #30 x 3   Entered by:   Barbra Sarks RN   Authorized by:   Aldona Bar MD   Signed by:   Aldona Bar MD on 12/14/2008   Method used:   Telephoned to ...       [facility] (retail)       [address] [place], [place]  [address]       Ph: [phone]       Fax: [phone]   RxID:   [id number]

## 2010-05-08 NOTE — Miscellaneous (Signed)
Summary: Office Visit ([facility])    Vital Signs:  Patient profile:   45 year old male Height:      69 inches (175.26 cm) Weight:      290.06 pounds (131.85 kg) BMI:     42.99 O2 Sat:      99 % on Room air Temp:     97.6 degrees F (36.44 degrees C) oral Pulse rate:   88 / minute Resp:     16 per minute BP sitting:   105 / 71  (left arm) Cuff size:   large  Vitals Entered By: Campbell Lerner CMA (March 30, 2009 3:06 PM)  O2 Flow:  Room air CC: pt. presents fu ed high cbg, r. thumb swollen//ckf Is Patient Diabetic? Yes Did you bring your meter with you today? No Pain Assessment Patient in pain? no      Nutritional Status BMI of > 30 = obese CBG Result 168  Does patient need assistance? Functional Status Self care Ambulation Normal   CC:  pt. presents fu ed high cbg and r. thumb swollen//ckf.  History of Present Illness: Pt went to urgent care with a paronychia and had it drained.  He needs more pain medication because it is still very painful. He was given a Rx for an antibiotic which he has not filled because he does not have the money.  He states that he has been taking all of his meds.  Current Problems (verified): 1)  Paronychia, Finger  (ICD-681.02) 2)  Dermatophytosis of Unspecified Site  (ICD-110.9) 3)  Erectile Dysfunction  (ICD-302.72) 4)  Venereal Wart  (ICD-078.11) 5)  Hypertension  (ICD-401.9) 6)  Diabetes Mellitus, Type II  (ICD-250.00) 7)  HIV Disease  (ICD-042)  Current Medications (verified): 1)  Truvada 200-300 Mg Tabs (Emtricitabine-Tenofovir) .... Take 1 Tablet By Mouth Once A Day 2)  Reyataz 300 Mg  Caps (Atazanavir Sulfate) .... Take 1 Tablet By Mouth Once A Day 3)  Norvir 100 Mg Caps (Ritonavir) .... Take 1 Capsule By Mouth Once A Day 4)  Dapsone 100 Mg  Tabs (Dapsone) .... Take 1 Tablet By Mouth Once A Day 5)  Glucophage 1000 Mg Tabs (Metformin Hcl) .... Take 1 Tablet By Mouth Two Times A Day 6)  Lisinopril 20 Mg Tabs (Lisinopril)  .... Take 1 Tablet By Mouth Once A Day 7)  Androgel 50 Mg/5gm Gel (Testosterone) .... Apply One Packet Q Day 8)  Norvir Tablets 100 Mg .... Take 1 By Mouth Daily. 9)  Podofilox 0.5 % Soln (Podofilox) .... Apply Twice A Day For 12 Hours Then Stop For 4 Days - May Repeat 3 Times 10)  Clotrimazole 1 % Crea (Clotrimazole) .... Apply Two Times A Day 11)  Viagra 50 Mg Tabs (Sildenafil Citrate) .... Use As Directed 12)  Lipitor 20 Mg .... Take One  Tablet By Mouth Daily. 13)  Novolin 70/30 70-30 % Susp (Insulin Isophane & Regular) .... 42 Units Before Breakfast and 36 Units Before Dinner 14)  Vicodin 5-500 Mg Tabs (Hydrocodone-Acetaminophen) .... Take 1 Tablet By Mouth Every 8 Hours As Needed  Allergies: 1)  ! Sulfa   Social History: Tobacco Use:  yes Residence:  occasionally  Review of Systems  The patient denies anorexia and fever.     Physical Exam  General:  alert, well-hydrated, and overweight-appearing.   Head:  normocephalic and atraumatic.   Lungs:  normal breath sounds.   Extremities:  small amount of swelling around right thumb   Impression &  Recommendations:  Problem # 1:  PARONYCHIA, FINGER (ICD-681.02) pt instructed to fill his Rx for the antibiotic and only then will I give him some pain medication.  Problem # 2:  DIABETES MELLITUS, TYPE II (ICD-250.00) Pt insturcted to take his meds as prescribed every day. Pt is monitoring his BS twice a day. His updated medication list for this problem includes:    Glucophage 1000 Mg Tabs (Metformin hcl) .Marland Kitchen... Take 1 tablet by mouth two times a day    Lisinopril 20 Mg Tabs (Lisinopril) .Marland Kitchen... Take 1 tablet by mouth once a day    Novolin 70/30 70-30 % Susp (Insulin isophane & regular) .Marland Kitchen... 42 units before breakfast and 36 units before dinner  Problem # 3:  HIV DISEASE (ICD-042) Pt to continue current meds. Diagnostics Reviewed:  CD4: 198 (04/14/2008)   WBC: 4.1 (12/08/2008)   Hgb: 12.7 (12/08/2008)   HCT: 40.0 (12/08/2008)    Platelets: 240 (12/08/2008) HIV genotype: See Comment (12/14/2008)   HIV-1 RNA: 2700 (12/08/2008)   HBSAg: negative (11/21/2005)  Complete Medication List: 1)  Truvada 200-300 Mg Tabs (Emtricitabine-tenofovir) .... Take 1 tablet by mouth once a day 2)  Reyataz 300 Mg Caps (Atazanavir sulfate) .... Take 1 tablet by mouth once a day 3)  Norvir 100 Mg Caps (Ritonavir) .... Take 1 capsule by mouth once a day 4)  Dapsone 100 Mg Tabs (Dapsone) .... Take 1 tablet by mouth once a day 5)  Glucophage 1000 Mg Tabs (Metformin hcl) .... Take 1 tablet by mouth two times a day 6)  Lisinopril 20 Mg Tabs (Lisinopril) .... Take 1 tablet by mouth once a day 7)  Androgel 50 Mg/5gm Gel (Testosterone) .... Apply one packet q day 8)  Norvir Tablets 100 Mg  .... Take 1 by mouth daily. 9)  Podofilox 0.5 % Soln (Podofilox) .... Apply twice a day for 12 hours then stop for 4 days - may repeat 3 times 10)  Clotrimazole 1 % Crea (Clotrimazole) .... Apply two times a day 11)  Viagra 50 Mg Tabs (Sildenafil citrate) .... Use as directed 12)  Lipitor 20 Mg  .... Take one  tablet by mouth daily. 13)  Novolin 70/30 70-30 % Susp (Insulin isophane & regular) .... 42 units before breakfast and 36 units before dinner 14)  Vicodin 5-500 Mg Tabs (Hydrocodone-acetaminophen) .... Take 1 tablet by mouth every 8 hours as needed   Patient Instructions: 1)  Please schedule a follow-up appointment in 2 weeks. Prescriptions: VICODIN 5-500 MG TABS (HYDROCODONE-ACETAMINOPHEN) Take 1 tablet by mouth every 8 hours as needed  #30 x 0   Entered and Authorized by:   Aldona Bar MD   Signed by:   Aldona Bar MD on 03/30/2009   Method used:   Print then Give to Patient   RxID:   [id number]

## 2010-05-08 NOTE — Miscellaneous (Signed)
Summary: Office Visit ([facility])    Vital Signs:  Patient profile:   45 year old male Weight:      299.4 pounds Temp:     98.2 degrees F oral Pulse rate:   76 / minute Pulse rhythm:   regular Resp:     16 per minute BP sitting:   114 / 78  (left arm)  Vitals Entered By: Barbra Sarks RN (February 16, 2009 11:04 AM) CC: f/u 05 Is Patient Diabetic? Yes Did you bring your meter with you today? No Pain Assessment Patient in pain? no      CBG Result 257  Does patient need assistance? Functional Status Self care Ambulation Normal   CC:  f/u 05.  History of Present Illness: Pt had high BS and was hospitalized and placed on insulin.  He still drinks sodas sometimes. He stopped all of his oral hypogycemics.  Habits & Providers  Alcohol-Tobacco-Diet     Alcohol drinks/day: 0     Tobacco Status: never  Exercise-Depression-Behavior     Does Patient Exercise: yes     Type of exercise: walks     Exercise (avg: min/session): >60     Times/week: <3     Drug Use: no     Seat Belt Use: 100     Sun Exposure: occasionally  Current Problems (verified): 1)  Paronychia, Finger  (ICD-681.02) 2)  Dermatophytosis of Unspecified Site  (ICD-110.9) 3)  Erectile Dysfunction  (ICD-302.72) 4)  Venereal Wart  (ICD-078.11) 5)  Hypertension  (ICD-401.9) 6)  Diabetes Mellitus, Type II  (ICD-250.00) 7)  HIV Disease  (ICD-042)  Current Medications (verified): 1)  Truvada 200-300 Mg Tabs (Emtricitabine-Tenofovir) .... Take 1 Tablet By Mouth Once A Day 2)  Reyataz 300 Mg  Caps (Atazanavir Sulfate) .... Take 1 Tablet By Mouth Once A Day 3)  Norvir 100 Mg Caps (Ritonavir) .... Take 1 Capsule By Mouth Once A Day 4)  Dapsone 100 Mg  Tabs (Dapsone) .... Take 1 Tablet By Mouth Once A Day 5)  Glucophage 1000 Mg Tabs (Metformin Hcl) .... Take 1 Tablet By Mouth Two Times A Day 6)  Lisinopril 20 Mg Tabs (Lisinopril) .... Take 1 Tablet By Mouth Once A Day 7)  Androgel 50 Mg/5gm Gel  (Testosterone) .... Apply One Packet Q Day 8)  Norvir Tablets 100 Mg .... Take 1 By Mouth Daily. 9)  Podofilox 0.5 % Soln (Podofilox) .... Apply Twice A Day For 12 Hours Then Stop For 4 Days - May Repeat 3 Times 10)  Clotrimazole 1 % Crea (Clotrimazole) .... Apply Two Times A Day 11)  Viagra 50 Mg Tabs (Sildenafil Citrate) .... Use As Directed 12)  Lipitor 20 Mg .... Take One  Tablet By Mouth Daily. 13)  Novolin 70/30 70-30 % Susp (Insulin Isophane & Regular) .... 42 Units Before Breakfast and 36 Units Before Dinner  Allergies: 1)  ! Sulfa   Social History: Tobacco Use:  yes Residence:  occasionally  Review of Systems  The patient denies anorexia and fever.     Physical Exam  General:  alert, well-developed, and overweight-appearing.   Head:  normocephalic and atraumatic.   Lungs:  normal breath sounds.     Impression & Recommendations:  Problem # 1:  DIABETES MELLITUS, TYPE II (ICD-250.00) Will refer to nutrtionist for DM education. Will re-start his glucophage.  He is to check his BS 3 times a day. The following medications were removed from the medication list:    Glucotrol 10  Mg Tabs (Glipizide) .Marland Kitchen... Take 1 tablet by mouth once a day His updated medication list for this problem includes:    Glucophage 1000 Mg Tabs (Metformin hcl) .Marland Kitchen... Take 1 tablet by mouth two times a day    Lisinopril 20 Mg Tabs (Lisinopril) .Marland Kitchen... Take 1 tablet by mouth once a day    Novolin 70/30 70-30 % Susp (Insulin isophane & regular) .Marland Kitchen... 42 units before breakfast and 36 units before dinner  Problem # 2:  HIV DISEASE (ICD-042) Continue current meds.  Will re-check labs in 4 weeks. Diagnostics Reviewed:  CD4: 198 (04/14/2008)   WBC: 4.1 (12/08/2008)   Hgb: 12.7 (12/08/2008)   HCT: 40.0 (12/08/2008)   Platelets: 240 (12/08/2008) HIV genotype: See Comment (12/14/2008)   HIV-1 RNA: 2700 (12/08/2008)   HBSAg: negative (11/21/2005)  Complete Medication List: 1)  Truvada 200-300 Mg Tabs  (Emtricitabine-tenofovir) .... Take 1 tablet by mouth once a day 2)  Reyataz 300 Mg Caps (Atazanavir sulfate) .... Take 1 tablet by mouth once a day 3)  Norvir 100 Mg Caps (Ritonavir) .... Take 1 capsule by mouth once a day 4)  Dapsone 100 Mg Tabs (Dapsone) .... Take 1 tablet by mouth once a day 5)  Glucophage 1000 Mg Tabs (Metformin hcl) .... Take 1 tablet by mouth two times a day 6)  Lisinopril 20 Mg Tabs (Lisinopril) .... Take 1 tablet by mouth once a day 7)  Androgel 50 Mg/5gm Gel (Testosterone) .... Apply one packet q day 8)  Norvir Tablets 100 Mg  .... Take 1 by mouth daily. 9)  Podofilox 0.5 % Soln (Podofilox) .... Apply twice a day for 12 hours then stop for 4 days - may repeat 3 times 10)  Clotrimazole 1 % Crea (Clotrimazole) .... Apply two times a day 11)  Viagra 50 Mg Tabs (Sildenafil citrate) .... Use as directed 12)  Lipitor 20 Mg  .... Take one  tablet by mouth daily. 13)  Novolin 70/30 70-30 % Susp (Insulin isophane & regular) .... 42 units before breakfast and 36 units before dinner   Patient Instructions: 1)  Please schedule a follow-up appointment in 2 weeks. 2)  Schedule an appt with nutritionist for education about DM diet Prescriptions: GLUCOPHAGE 1000 MG TABS (METFORMIN HCL) Take 1 tablet by mouth two times a day  #60 x 5   Entered and Authorized by:   Aldona Bar MD   Signed by:   Aldona Bar MD on 02/16/2009   Method used:   Print then Give to Patient   RxID:   [id number] NOVOLIN 70/30 70-30 % SUSP (INSULIN ISOPHANE & REGULAR) 42 units before breakfast and 36 units before dinner  #2 bottles x 5   Entered and Authorized by:   Aldona Bar MD   Signed by:   Aldona Bar MD on 02/16/2009   Method used:   Print then Give to Patient   RxID:   [id number]

## 2010-05-08 NOTE — Miscellaneous (Signed)
Summary: [facility]: Orders  [facility]: Orders   Imported By: Bonner Puna 09/06/2009 11:48:40  _____________________________________________________________________  External Attachment:    Type:   Image     Comment:   External Document

## 2010-05-08 NOTE — Letter (Signed)
Summary: Generic on Letterhead Paper  [facility]  [address]   [place], [place] [address]   Phone: [phone]  Fax: [phone]     Today's Date:  January 08, 2010  Re:  Keith [name]     This letter is being given to [name] today to verify that his appointment for today has been rescheduled to this Wednesday, October 5th. He came to todays' appointment an hour late and Trea is in agreement to his appointment being rescheduled and  says that he can make it to WEdnesday's appointment.     Sincerely,    Barnabas Harries RD,CDE

## 2010-05-08 NOTE — Miscellaneous (Signed)
Summary: Orders Update  Clinical Lists Changes  Orders: Added new Referral order of Misc. Referral (Misc. Ref) - Signed 

## 2010-05-08 NOTE — Progress Notes (Signed)
Summary: genotype  Phone Note Outgoing Call   Call placed by: Barbra Sarks RN,  April 20, 2009 9:00 AM Call placed to: lab Summary of Call: [facility] called and genotype added to viral load. Initial call taken by: Barbra Sarks RN,  April 20, 2009 9:01 AM

## 2010-05-08 NOTE — Miscellaneous (Signed)
Summary: [facility]  Clinical Lists Changes BC discussed with Dr. Tomma Lightning about pt going to [facility] short term to get his HIV and DM under control. BC called Vernie Ammons and left a message to discuss pt's appropriatness for [facility]. BC will discuss with pt once [facility] has spoken with Fraser Din.

## 2010-05-08 NOTE — Assessment & Plan Note (Signed)
Summary: Keith [name] [mkj]   CC:  follow-up visit.  History of Present Illness: Pt here for f/u of hospitalization for uncontrolled DM. Pt has been completey non-compliant with medication , F/u appts and has been deceitful. He wasn confronted about this.  I told him that he needed to be truthful with me, take his medications and keep his f/u appts. in order for me to help him get his disability. Pt expressed understanding.  He states that he has a lot of "things" going on in his lifeand he has been under a lot of stress.  Preventive Screening-Counseling & Management  Alcohol-Tobacco     Alcohol drinks/day: 0     Smoking Status: never  Caffeine-Diet-Exercise     Caffeine use/day: occasional coffee     Does Patient Exercise: yes     Type of exercise: walks     Exercise (avg: min/session): >60     Times/week: <3  Hep-HIV-STD-Contraception     HIV Risk: no     Sun Exposure-Excessive: occasionally  Safety-Violence-Falls     Seat Belt Use: 100      Drug Use:  no.    Comments: pt. given condoms   Updated Prior Medication List: TRUVADA 200-300 MG TABS (EMTRICITABINE-TENOFOVIR) Take 1 tablet by mouth once a day REYATAZ 300 MG  CAPS (ATAZANAVIR SULFATE) Take 1 tablet by mouth once a day DAPSONE 100 MG  TABS (DAPSONE) Take 1 tablet by mouth once a day GLUCOPHAGE 1000 MG TABS (METFORMIN HCL) Take 1 tablet by mouth two times a day ANDROGEL 50 MG/5GM GEL (TESTOSTERONE) apply one packet q day NORVIR 100 MG TABS (RITONAVIR) Take 1 tablet by mouth once a day NOVOLIN 70/30 70-30 % SUSP (INSULIN ISOPHANE & REGULAR) take 70 units in the morning and 70 units at night/ pt also uses sliding scale * ASPIRIN 81 MG Take one tablet by mouth daily. LISINOPRIL 40 MG TABS (LISINOPRIL) Take 1 tablet by mouth once a day ZIDOVUDINE 300 MG TABS (ZIDOVUDINE) one tablet by mouth two times a day ATENOLOL 50 MG TABS (ATENOLOL) Take 1 tablet by mouth once a day LANTUS 100 UNIT/ML SOLN (INSULIN GLARGINE) 50  units one time a day  Current Allergies (reviewed today): ! SULFA Past History:  Past Medical History: Last updated: 05/19/2008 HIV disease Diabetes mellitus, type II Hypertension  Review of Systems  The patient denies anorexia, fever, and weight loss.    Vital Signs:  Patient profile:   45 year old male Height:      69 inches (175.26 cm) Weight:      271.8 pounds (123.55 kg) BMI:     40.28 Temp:     98.2 degrees F (36.78 degrees C) oral Pulse rate:   118 / minute BP sitting:   97 / 65  (left arm)  Vitals Entered By: Myrtis Hopping CMA Deborra Medina) (September 20, 2009 3:15 PM) CC: follow-up visit Is Patient Diabetic? Yes Did you bring your meter with you today? No Pain Assessment Patient in pain? no      Nutritional Status BMI of > 30 = obese Nutritional Status Detail appetite "not good"  Does patient need assistance? Functional Status Self care Ambulation Normal Comments several missed doses of HAART meds per patient   Physical Exam  General:  alert, well-hydrated, and overweight-appearing.   Head:  normocephalic and atraumatic.   Lungs:  normal breath sounds.     Impression & Recommendations:  Problem # 1:  DIABETES MELLITUS, TYPE II (ICD-250.00)  Keith [name] is  following pt will start him on 50 units of lantus q d pt will f/u in 1 week. His updated medication list for this problem includes:    Glucophage 1000 Mg Tabs (Metformin hcl) .Marland Kitchen... Take 1 tablet by mouth two times a day    Novolin 70/30 70-30 % Susp (Insulin isophane & regular) .Marland Kitchen... Take 70 units in the morning and 70 units at night/ pt also uses sliding scale    Lisinopril 40 Mg Tabs (Lisinopril) .Marland Kitchen... Take 1 tablet by mouth once a day    Lantus 100 Unit/ml Soln (Insulin glargine) .Marland KitchenMarland KitchenMarland KitchenMarland Kitchen 50 units one time a day  Orders: Est. Patient Level II [id number])  Problem # 2:  HIV DISEASE (ICD-042)  P to continue current meds and we will check his labs in 1 week when he returns.  Orders: Est. Patient Level  II [id number])  Medications Added to Medication List This Visit: 1)  Lantus 100 Unit/ml Soln (Insulin glargine) .... 50 units one time a day  Patient Instructions: 1)  Please schedule a follow-up appointment in 1 week.

## 2010-05-08 NOTE — Miscellaneous (Signed)
  Clinical Lists Changes  Observations: Added new observation of YEARAIDSPOS: 2004  (02/26/2010 11:37)

## 2010-05-08 NOTE — Progress Notes (Signed)
  Phone Note Outgoing Call   Call placed by: Kennyth Lose Summary of Call: Called pt. to inform him of appt. with Barnabas Harries on Thurs. 11/02/09 at 11:00 AM.  Pt. said his insulin was thrown away when he was at ED.  Pt. is meeting with [facility] today and we will try to get pt. a supply of insulin. Initial call taken by: Myrtis Hopping CMA Deborra Medina),  October 30, 2009 11:40 AM

## 2010-05-08 NOTE — Miscellaneous (Signed)
Summary: [facility]  Clinical Lists Changes Pt called BC today to let [facility] know that he was on the way to [facility] to pick up his medications. BC praised pt. Pt reports that his BS was 299 this morning.   Keith [name]  November 07, 2009 3:13 PM

## 2010-05-08 NOTE — Progress Notes (Signed)
Summary: med refills  Phone Note Refill Request Message from:  Pharmacy on August 03, 2008 12:03 PM  Refills Requested: Medication #1:  TRUVADA 200-300 MG TABS Take 1 tablet by mouth once a day   Dosage confirmed as above?Dosage Confirmed   Brand Name Necessary? No   Supply Requested: 1 month   Last Refilled: 07/11/2008  Medication #2:  REYATAZ 300 MG  CAPS Take 1 tablet by mouth once a day   Dosage confirmed as above?Dosage Confirmed   Brand Name Necessary? No   Supply Requested: 1 month   Last Refilled: 07/11/2008  Medication #3:  DAPSONE 100 MG  TABS Take 1 tablet by mouth once a day   Dosage confirmed as above?Dosage Confirmed   Brand Name Necessary? No   Supply Requested: 1 month Last seen 05/19/08.   Method Requested: Telephone to Pharmacy Initial call taken by: Barbra Sarks RN,  August 03, 2008 12:05 PM      Prescriptions: DAPSONE 100 MG  TABS (DAPSONE) Take 1 tablet by mouth once a day  #30 x 5   Entered by:   Barbra Sarks RN   Authorized by:   Aldona Bar MD   Signed by:   Aldona Bar MD on 08/04/2008   Method used:   Telephoned to ...       [facility] (retail)       [address] [place]       [place]  [address]       Ph: [phone]       Fax: [phone]   [id number] REYATAZ 300 MG  CAPS (ATAZANAVIR SULFATE) Take 1 tablet by mouth once a day  #30 x 5   Entered by:   Barbra Sarks RN   Authorized by:   Aldona Bar MD   Signed by:   Aldona Bar MD on 08/04/2008   Method used:   Telephoned to ...       [facility] (retail)       [address] [place]  [address]       Ph: [phone]       Fax: [phone]   RxIDIK:[id number] TRUVADA 200-300 MG TABS (EMTRICITABINE-TENOFOVIR) Take 1 tablet by mouth once a day  #30 x 5   Entered by:   Barbra Sarks RN   Authorized by:   Aldona Bar MD   Signed by:   Aldona Bar MD on 08/04/2008   Method used:   Telephoned to ...   [facility] (retail)       [address] [place] [place]  [address]       Ph: [phone]       Fax: [phone]   RxIDSU:[id number]

## 2010-05-08 NOTE — Miscellaneous (Signed)
Summary: Office Visit ([facility])     Chief Complaint:  05 dm follow up.  History of Present Illness: Pt here for f/u.  He has been out of his DM meds for several days. He will pick them up today. He has been taking his HIV meds regularly.  He is overall feeling better.    Updated Prior Medication List: TRUVADA 200-300 MG TABS (EMTRICITABINE-TENOFOVIR) Take 1 tablet by mouth once a day REYATAZ 300 MG  CAPS (ATAZANAVIR SULFATE) Take 1 tablet by mouth once a day NORVIR 100 MG CAPS (RITONAVIR) Take 1 capsule by mouth once a day DAPSONE 100 MG  TABS (DAPSONE) Take 1 tablet by mouth once a day GLUCOPHAGE 1000 MG TABS (METFORMIN HCL) Take 1 tablet by mouth two times a day GLUCOTROL 10 MG TABS (GLIPIZIDE) Take 1 tablet by mouth once a day LISINOPRIL 20 MG TABS (LISINOPRIL) Take 1 tablet by mouth once a day ANDROGEL 50 MG/5GM GEL (TESTOSTERONE) apply one packet q day  Current Allergies: ! SULFA  Past Medical History:    Reviewed history from 10/06/2006 and no changes required:       HIV disease       Diabetes mellitus, type II       Hypertension    Risk Factors:  Tobacco use:  never Drug use:  no HIV high-risk behavior:  no Caffeine use:  2 drinks per day Alcohol use:  no Exercise:  yes    Times per week:  7    Type:  walking 30 mins Seatbelt use:  100 % Sun Exposure:  occasionally   Review of Systems  The patient denies anorexia, fever, and weight loss.     Vital Signs:  Patient Profile:   45 Years Old Male Height:     69 inches (175.26 cm) Weight:      320 pounds (145.45 kg) BMI:     47.43 O2 Sat:      97 % O2 treatment:    Room Air Temp:     98.2 degrees F oral Pulse rate:   87 / minute Pulse rhythm:   regular Resp:     20 per minute BP sitting:   126 / 104  (left arm)  Vitals Entered By: Rhunette Croft (May 19, 2008 10:22 AM)             Is Patient Diabetic? Yes Did you bring your meter with you today? No Nutritional Status BMI of > 30 =  obese Nutritional Status Detail non fasting CBG Result 188 CBG Device ID e  Does patient need assistance? Functional Status Self care Ambulation Normal     Physical Exam  General:     alert, well-hydrated, and overweight-appearing.   Head:     normocephalic and atraumatic.   Mouth:     pharynx pink and moist.  no thrush  Lungs:     normal breath sounds.      Impression & Recommendations:  Problem # 1:  HIV DISEASE (ICD-042) We will obtain labs in 6 weeks and he will return 2 weeks later to see me.  He was given his first Hep B vaccine. He is encouraged to take his meds regularly and not miss doses. His updated medication list for this problem includes:    Truvada 200-300 Mg Tabs (Emtricitabine-tenofovir) .Marland Kitchen... Take 1 tablet by mouth once a day    Reyataz 300 Mg Caps (Atazanavir sulfate) .Marland Kitchen... Take 1 tablet by mouth once a day    Norvir  100 Mg Caps (Ritonavir) .Marland Kitchen... Take 1 capsule by mouth once a day  Diagnostics Reviewed:  Hgb: 13.5 (04/15/2008)   HCT: 41.6 (04/15/2008)   HIV-1 RNA: <48 copies/mL (04/15/2008)    Orders: Admin of Therapeutic Inj (IM or [place]) ([address]) Hepatitis B Vaccine ADOLESCENT (2 dose) [id number])   Problem # 2:  DIABETES MELLITUS, TYPE II (ICD-250.00) Assessment: Improved Pt will pick up his meds today. His updated medication list for this problem includes:    Glucophage 1000 Mg Tabs (Metformin hcl) .Marland Kitchen... Take 1 tablet by mouth two times a day    Glucotrol 10 Mg Tabs (Glipizide) .Marland Kitchen... Take 1 tablet by mouth once a day    Lisinopril 20 Mg Tabs (Lisinopril) .Marland Kitchen... Take 1 tablet by mouth once a day  Orders: Capillary Blood Glucose (82948) Fingerstick [id number])   Problem # 3:  HYPERTENSION (ICD-401.9) Pt will start his lisinopril. His updated medication list for this problem includes:    Lisinopril 20 Mg Tabs (Lisinopril) .Marland Kitchen... Take 1 tablet by mouth once a day   Medications Added to Medication List This Visit: 1)  Glucophage 1000 Mg Tabs (Metformin  hcl) .... Take 1 tablet by mouth two times a day 2)  Glucotrol 10 Mg Tabs (Glipizide) .... Take 1 tablet by mouth once a day 3)  Lisinopril 20 Mg Tabs (Lisinopril) .... Take 1 tablet by mouth once a day 4)  Androgel 50 Mg/5gm Gel (Testosterone) .... Apply one packet q day  Other Orders: Est. Patient Level III [id number]) Admin 1st Vaccine [id number])  Future Orders: T-CD4 [id number]) ... 06/30/2008 T-HIV Viral Load [phone]) ... 06/30/2008 T-Comprehensive Metabolic Panel ([id number]) ... 06/30/2008 T-CBC w/Diff [id number]) ... 06/30/2008 T-Lipid Profile [phone]) ... 06/30/2008     Patient Instructions: 1)  Please schedule a follow-up appointment in 8 weeks.    Prescriptions: LISINOPRIL 20 MG TABS (LISINOPRIL) Take 1 tablet by mouth once a day  #30 x 5   Entered and Authorized by:   Aldona Bar MD   Signed by:   Aldona Bar MD on 05/19/2008   Method used:   Print then Give to Patient   RxID:   [id number]    Hepatitis B Immunization History:    Hep B # 1:  HepB Adolescent (05/19/2008)  Influenza Immunization History:    Influenza # 1:  Fluvax 3+ (03/24/2008)  Hepatitis B Vaccine # 1    Vaccine Type: HepB Adolescent    Site: right deltoid    Mfr: Merck    Dose: 0.5 ml    Route: IM    Given by: Rhunette Croft    Exp. Date: 09/09/2010    Lot #: [id number]    VIS given: 10/23/05 version given May 19, 2008. dose given x2=73mL Chantel Miller  May 19, 2008 11:09 AM

## 2010-05-08 NOTE — Progress Notes (Signed)
Summary: walk in cbg check       Additional Follow-up for Phone Call Additional follow up Details #2::    Pt given a script for glucometer weeks ago but because he did not have current eligibility was unable to get it Follow-up by: Aldona Bar MD,  February 07, 2009 2:23 PM  930 am Pt. presented to [facility] stating he would like his cbg checked. He states he has been urinating more frequently and thinks it is up. CBG high on glucometer (>500). Patient took his Metformin this morning. Pt. recently in the hospital and has not had his scripts filled and was prescribed insulin. Pt. advised to go to the ER for tx, get his scripts filled, and make an appt. on 11/11 with Dr. Tomma Lightning. Pt. verbalized understanding and said he would come back at 2 pm with his prescriptions. Pt. just finished his eligibility. Pt. advised to obtain a glucometer if he does not have one or it is not working. Script for glucometer will be given to [place] if needed. Barbra Sarks RN

## 2010-05-08 NOTE — Assessment & Plan Note (Signed)
Summary: Nurse Visit (Infectious Disease)    Vitals Entered By: Lorne Skeens, RN CC: Pt. observed drawing up and giving himself his Lantus insulin.  Pt. demonstarted the procedure correctly.  Pt. stated that he already has a pillbox.  entered by Lorne Skeens, RN.  Patient Instructions: 1)  You need to continue to give yourself your Insulin (Lantus) at noon every day. 2)  Here is the list of your other medications. 3)  You are being given a pill box to help you organize your medications in order to take them appropriately.  Prior Medications: TRUVADA 200-300 MG TABS (EMTRICITABINE-TENOFOVIR) Take 1 tablet by mouth once a day REYATAZ 300 MG  CAPS (ATAZANAVIR SULFATE) Take 1 tablet by mouth once a day DAPSONE 100 MG  TABS (DAPSONE) Take 1 tablet by mouth once a day GLUCOPHAGE 1000 MG TABS (METFORMIN HCL) Take 1 tablet by mouth two times a day ANDROGEL 50 MG/5GM GEL (TESTOSTERONE) apply one packet q day NORVIR 100 MG TABS (RITONAVIR) Take 1 tablet by mouth once a day ASPIRIN 81 MG () Take one tablet by mouth daily. LISINOPRIL 40 MG TABS (LISINOPRIL) Take 1 tablet by mouth once a day ZIDOVUDINE 300 MG TABS (ZIDOVUDINE) one tablet by mouth two times a day ATENOLOL 50 MG TABS (ATENOLOL) Take 1 tablet by mouth once a day LANTUS 100 UNIT/ML SOLN (INSULIN GLARGINE) 60 units one time a day Current Allergies: ! SULFA  Had to give patient a sample of Norvir in exchange for his Norvir.  The one he had has expired on July 07, 2009 from [place].  Sample Given, Lot #: Norvir 100mg  [id number] Expiration Date:Mar 2012 Patient has been instructed regarding the correct time, dose and frequency of taking this med, including desired effects and most common side effects. Keith [name]  BS,CPht II,MPH  November 03, 2009 1:32 PM

## 2010-05-08 NOTE — Progress Notes (Signed)
Summary: update on pt. from [facility]  Phone Note Other Incoming   Caller: [facility] Nurse Summary of Call: [name] called to say he was unable to bring insulin to pt. on Friday, 09/15/09, because pt. not home and did not return calls.  Pt. seen in ER 09/18/09 for glucose over 900, was brought down to 235 and pt. was discharged.  Pt. has been seen in ER 14 x since April 2011 and 26 x in last year for very high glucose. Initial call taken by: Myrtis Hopping CMA Deborra Medina),  September 18, 2009 5:03 PM

## 2010-05-08 NOTE — Miscellaneous (Signed)
Summary: Bridge Counselor  Clinical Lists Changes BC transported pt to his medicaid hearing today. Pt's case will be held open for 30 days so that [facility] can submit pt's mental health records. BC will continue to follow up with pt and submit records when they become available. Pt has a mental health appointment on 12/01/09 at [place]  November 23, 2009 9:34 AM

## 2010-05-08 NOTE — Progress Notes (Signed)
Summary: [facility] meds arrived for Aug--left message  Phone Note Refill Request      Prescriptions: ZIDOVUDINE 300 MG TABS (ZIDOVUDINE) one tablet by mouth two times a day  #60 x 0   Entered by:   Canary Brim  BS,CPht II,MPH   Authorized by:   Aldona Bar MD   Signed by:   Canary Brim  BS,CPht II,MPH on 11/13/2009   Method used:   Samples Given   RxID:   [id number] NORVIR 100 MG TABS (RITONAVIR) Take 1 tablet by mouth once a day  #30 x 0   Entered by:   Canary Brim  BS,CPht II,MPH   Authorized by:   Aldona Bar MD   Signed by:   Canary Brim  BS,CPht II,MPH on 11/13/2009   Method used:   Samples Given   RxID:   [id number] DAPSONE 100 MG  TABS (DAPSONE) Take 1 tablet by mouth once a day  #30 x 0   Entered by:   Canary Brim  BS,CPht II,MPH   Authorized by:   Aldona Bar MD   Signed by:   Canary Brim  BS,CPht II,MPH on 11/13/2009   Method used:   Samples Given   RxID:   [id number] TRUVADA 200-300 MG TABS (EMTRICITABINE-TENOFOVIR) Take 1 tablet by mouth once a day  #30 x 0   Entered by:   Canary Brim  BS,CPht II,MPH   Authorized by:   Aldona Bar MD   Signed by:   Canary Brim  BS,CPht II,MPH on 11/13/2009   Method used:   Samples Given   RxID:   [id number] REYATAZ 300 MG  CAPS (ATAZANAVIR SULFATE) Take 1 tablet by mouth once a day  #30 x 0   Entered by:   Canary Brim  BS,CPht II,MPH   Authorized by:   Aldona Bar MD   Signed by:   Canary Brim  BS,CPht II,MPH on 11/13/2009   Method used:   Samples Given   RxID:   [id number]  Patient Assist Medication Verification: Medication name: Reyataz 300mg  RX # [id number] Tech approval:MLD  Patient Assist Medication Verification: Medication name:zidovudine 300mg  RX # [id number] Tech approval:MLD  Patient Assist Medication Verification: Medication name:truvada RX # [id number] Tech approval:MLD  Patient Assist Medication Verification: Medication name:Norvir 100mg  RX # [id number] Tech  approval:MLD  Patient Assist Medication Verification: Medication name: Dapsone 100mg  RX # [id number] Tech approval:MLD Call placed to patient with message that assistance medications are ready for pick-up. Left message for patient. Canary Brim  BS,CPht II,MPH  November 13, 2009 3:21 PM    Appended Document: NCADAP meds arrived for Aug--left message Prescription/Samples picked up by: patient;

## 2010-05-08 NOTE — Assessment & Plan Note (Signed)
Summary: DM TEACHING/CH   Vital Signs:  Patient profile:   45 year old male Weight:      278.3 pounds BMI:     41.25 Is Patient Diabetic? Yes Did you bring your meter with you today? No CBG Result 458 CBG Device ID office True TRack Comments sub from [employer] before blood sugar- chicken on wheat, unsweet tea with splenda Patient did not come back today with meter. See phone note.  Allergies: 1)  ! Sulfa   Complete Medication List: 1)  Truvada 200-300 Mg Tabs (Emtricitabine-tenofovir) .... Take 1 tablet by mouth once a day 2)  Reyataz 300 Mg Caps (Atazanavir sulfate) .... Take 1 tablet by mouth once a day 3)  Dapsone 100 Mg Tabs (Dapsone) .... Take 1 tablet by mouth once a day 4)  Glucophage 1000 Mg Tabs (Metformin hcl) .... Take 1 tablet by mouth two times a day 5)  Androgel 50 Mg/5gm Gel (Testosterone) .... Apply one packet q day 6)  Norvir 100 Mg Tabs (Ritonavir) .... Take 1 tablet by mouth once a day 7)  Aspirin 81 Mg  .... Take one tablet by mouth daily. 8)  Lisinopril 40 Mg Tabs (Lisinopril) .... Take 1 tablet by mouth once a day 9)  Zidovudine 300 Mg Tabs (Zidovudine) .... One tablet by mouth two times a day 10)  Atenolol 50 Mg Tabs (Atenolol) .... Take 1 tablet by mouth once a day 11)  Lantus 100 Unit/ml Soln (Insulin glargine) .... 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 got 7 vials in August- At current dose should  need 7  vials every 3 months. He should have used about 2-3 vials by this time and should have . agreed to go home to get meter and insulin and come right back.     Monitoring Self monitoring blood glucose 2 times a day Name of Meter  office True TRack  Recent Episodes of: Requiring Help from another person  Hyperglycemia : Yes     Estimated /Usual Carb Intake Breakfast # of Carbs/Grams oven roasted chicken sandwich from [place] with unsweet tea    BEHAVIORAL GOAL FOLLOW UP Utilizing medications if for therapeutic effectiveness: Always per patient report  Goal attained      Is focused on weight  and what to eat to drop weight. says he has been walking. CBg very high today after eating. It is consistent with A1C in hospital of 18.9 %. Note patient attained CBGs ranging from 168-335 with most in 200s on 60-70 units lantus with  ~ 15 units rapid acting insulin( 5-8 with meals)  during his last hospital stay Diabetes Self Management Support: home health and Bridge counseling case manager, ID office Follow-up:1 month

## 2010-05-08 NOTE — Miscellaneous (Signed)
Summary: Bridge Counselor  Clinical Lists Changes Pt called BC today to let [facility] know that he has been taking his medications. Pt reports his BS was 276 this morning. Pt also reports that he is going to his mental health appointment today and will call [facility] after to let [facility] know how the appointment went.  [name]  November 09, 2009 11:17 AM

## 2010-05-08 NOTE — Assessment & Plan Note (Signed)
Summary: Hospital Admission  INTERNAL MEDICINE TEACHING SERVICE  ADMISSION HISTORY & PHYSICAL   Attending: Dr. Terressa Koyanagi First contact: Dr. Jerelene Redden [phone]  Second contact: Dr. Donnella Bi [phone] [facility], after-hours: [phone], [phone])    PCP: Dr. Tomma Lightning   CC: High blood sugar   HPI: [name] is a 45 yo man with PMH significant for diabetes who presents with CC of hyperglycemia.  He is somnolent during the interview and offers limited answers to any questions asked.  He confirms he came to the ED for treatment of high blood sugar; he initially responds yes when asked if he did not have his Insulin at home, but later states he was taking his Insulin as directed.  He denies chest pain, SOB, syncope, abdominal pain, fevers, chills, nausea, vomiting, and diarrhea.  He admits to feeling very thirsty and needed to urinate frequently.  He denies dysuria.  He also admits to feeling very tired and wanting to sleep.  He is unable or unwilling to state the duration of his symptoms.   ALLERGIES: SULFA   PAST MEDICAL HISTORY  1. Hyperglycemia secondary to insulin noncompliance. 2. Insulin-dependent diabetes mellitus with A1c of more than 13. 3. Morbid obesity. 4. HIV on HAART 5. Dyslipidemia. 6. Hypertension. 7. History of noncompliance. 8. History of erectile dysfunction.   DISCHARGE MEDICATIONS: 1. Truvada 200/300 mg 1 tablet by mouth daily. 2. Reyataz 300 mg capsules by mouth daily. 3. Norvir 100 mg by mouth daily. 4. Dapsone  100 mg by mouth daily. 5. Metformin 1000 mg by mouth twice daily. 6. AndroGel 50 mg/5 g gel apply packet daily. 7. Norvir tablets 100 mg tablet take 1 by mouth daily. 8. Podofilox 0.5 mg solution, apply twice a day for 12 hours, then     stop in 4 days, may repeat up to 3 times. 9. Clotrimazole 1% cream apply twice daily. 10.Viagra 50 mg tablets, use as directed. 11.Lipitor 20 mg tablets take 1 tablet by mouth daily. 12.NovoLog 70/30 insulin 50 units before  breakfast and 40 units before     dinner. 13.Vicodin 5/500, take 1 tablet every 8 hours as needed. 14.Aspirin 81 mg take 1 tablet by mouth daily. 15.Lisinopril 40 mg tablets take 1 tablet by mouth daily.   SOCIAL HISTORY: Denies any tobacco, alcohol or drug use unemployed  FAMILY HISTORY noncontributory  ROS: all other systems reviewed and negative   VITAL SIGNS BP 148/91   PR 67     RR 16        TEMP 97.5       O2 SAT 98% ON RA  PHYSICAL EXAM: General:  obese 45 y/o M, oriented x 3, somnolent but rousable, repeatedly falls asleep during interview Head:  normocephalic and atraumatic.   Eyes:  PERRLA, EOMI, sclerae and conjunctivae within normal limits   Mouth:  pharynx pink and very dry, no erythema, and no exudates.   Neck:  supple, full ROM, no thyromegaly, no JVD, and no carotid bruits.   Lungs:  normal respiratory effort, no accessory muscle use, normal breath sounds, no crackles, and no wheezes.   Heart:  borderline bradycardic, regular rhythm, no murmur, no gallop, and no rub.   Abdomen:  soft, non-tender, normal bowel sounds, no distention, no guarding Extremities:  No cyanosis, clubbing, edema.  Venous stasis changes present. Neurologic: drowsy, but responds to commands and moving all extremities. Grossy non-focal, but full neuro exam not possible.  Skin:  turgor decreased and very dry without rashes.  LAB RESULTS  CARDIAC ENZYMES: POC: MG 144, CKMB 2.6, TROP I <0.05 URINE NORMAL EXCEPT GLU >1000, KETONES NEGATIVE            13.2   5.3 >--------<  177       ANC: 2.5               MCV: 83.8   119         85        11 -----------------------------< 1029    5.3         25       1.06  Calcium:  9.6     Anion gap: 9     Calculated osmolality: 299.1      CXR: NAP  EKG: Sinus brady.  Poor R wave progression. Prominant T waves. No signs of ischemia.   ASSESSMENT AND PLAN: (1)  Hyperglycemia The pt's current state of hyperglycemia is most likely from  insulin deficiency.  His presentation is c/w HHS; no anion gap to suggest DKA.  Other etiologies include: infectious process (UA, CXR clear), inflammatory process (no abdominal tenderness to suggest pancreatitis/cholecystits), drugs/EtOH (pt denies use, UDS neg.  Pt not taking thiazide, steroid) and infarction (no focal findings on neuro exam to suggest cerebral infarct, no abdominal findings to suggest bowel ischemia). - Will admit to SDU - Aggressive IVF: NS at 264mL/hr, then transition to D5 1/2NS once CBGs 250-300. - Insulin gtt until CBGs 250, then resume subQ insulin - Bmet Q2, CBG Q1 - Cycle cardiac enzymes to eval for ischemia - Blood cxs x 2 - Will initiate K repletion once K < 4.75mEq - NPO   (2)  042: Truvada resistance Pt did not make his f/u appt with Dr. Tomma Lightning on 05/18/09; this was cancelled and rescheduled for the 17th. Last CD4 on 05/15/09 of 130 with VL of 22,600; genotype revealed Truvada resistance.  Will continue HAART regimen without Truvada and discuss importance of f/u appt with Dr. Tomma Lightning. - Continue HAART regimen sans Truvada   (3)  HLD LFTs checked and wnl during previous admission; FLP checked in Dec 2010.   - Continue lipitor  (4)  HTN: SBPs 120-150.   - Continue to monitor - Resume Lisinopril once pt tolerating by mouth and BP remains stable  (5)  VTE PROPH: heparin subcutaneously   (6)  Pseudohyponatremia Na corrects to 135 with current glucose value

## 2010-05-08 NOTE — Miscellaneous (Signed)
Summary: Bridge Counselor  Clinical Lists Changes Pt was enrolled into the bridge counseling program today. Pt has a medicaid hearing on Aug 17th. BC will attend. BC will also reschedule his appointment with Lenis Dickinson on Thursday as BC would like to attend with pt and has a conflict that date. BC discussed Tx adherence with pt and he reports that he is taking his medications "everyday." BC called pt out and he admits to "quitting everything" about 3-4 months ago. BC is working with pt to get his insulin and supplies.  Keith [name]  October 31, 2009 11:34 AM

## 2010-05-08 NOTE — Miscellaneous (Signed)
Summary: Lab orders  Clinical Lists Changes  Orders: Added new Test order of T-Hgb A1C (in-house) [phone]) - Signed Added new Test order of T-CBC w/Diff [id number]) - Signed Added new Test order of T-CD4SP [facility]) (CD4SP) - Signed Added new Test order of T-Comprehensive Metabolic Panel ([id number]) - Signed Added new Test order of T-HIV Viral Load [phone]) - Signed

## 2010-05-08 NOTE — Miscellaneous (Signed)
Summary: Bridge Counselor  Clinical Lists Changes BC coordinated with [facility] to obtain pt's mental health records. BC will coordinate getting said records to DSS to be considered for pt's medicaid determination.  [name]  December 19, 2009 3:09 PM

## 2010-05-08 NOTE — Miscellaneous (Signed)
Summary: Bridge Counselor  Clinical Lists Changes BC spoke with Alianna at the patient assistance program today about pt's insulin. Pt was approved on 11/08/09 and his medication was shipped on 11/09/09. Pt's insulin (10 vials) should arrive to the clinic no later than Friday 11/17/09. Pt's insulin supply that was obtained through [facility] will last pt until 11/19/09. Pt needs to get his medication in his hands no later than 11/20/09. Please be aware that it should be arriving soon and let [facility] know when it is here.   Kim [facility]  November 14, 2009 12:00 PM

## 2010-05-08 NOTE — Miscellaneous (Signed)
Summary: Bridge Counselor  Clinical Lists Changes BC transported pt to his mental health evaluation today. Pt was rescheduled for August 4th at [place] also provided Tx adherence today. Pt was given a new pillbox today and BC discussed each medication in detail as pt filled his pillbox. BC also created a list of pt's medications, listing how many to take each day and when to take them, to keep inside his pillbox. Pt was missing his lisinopril and atenolol. Maria sent refills to [facility] on [address]. and pt reports that he can pick these up. Pt was also missing his dapsone. Verdis Frederickson will access this medication though pt's ADAP and will let BC know when it is available to pick up. Pt's BS was 300 today which is much better for this pt. BC will follow up with pt tomorrow regarding his medications from [place].  [name]  November 07, 2009 3:04 PM

## 2010-05-08 NOTE — Miscellaneous (Signed)
Summary: Home Care Providers: Pt. Care Update  Home Care Providers: Pt. Care Update   Imported By: Bonner Puna 08/04/2009 14:06:54  _____________________________________________________________________  External Attachment:    Type:   Image     Comment:   External Document

## 2010-05-08 NOTE — Discharge Summary (Signed)
  Hospital Discharge  Date of admission:05/15/2009  Date of discharge:05/16/2009  Brief reason for admission/active problems:  -Hyperglycemia with no sign of DKA>> admitted for overnight observation, patient was stable on 2/8 and dc'd home..  Followup needed:  -Patient needs better control of DM as A1c is >13 (2/7). -Patient is resistant to truvada (emtricitabine) per latest HIV 1 Genotyping, regiment will need to be adjusted.   -The medication and problem lists have been updated.   -Please see the dictated discharge summary for details.   Patient Instructions: 1)  Please come to an appointment with Dr Tomma Lightning 05/17/2009 at 3.45pm 2)  Please take your medication as prescribed below. 3)  If you have any problem, Please call the clinic or if it's an emergency go to the emergency department or dial 911.

## 2010-05-08 NOTE — Progress Notes (Signed)
Summary: pt. c/o of sores   Phone Note Call from Patient   Caller: Patient Reason for Call: Acute Illness Details for Reason: c/o of sores Summary of Call: Pt. walked into clinic c/o of sores on genital area.  Said he bought underwear from [employer] and wore them without washing and broke out in sores.  Pt. given appt. for 08/16/09 at 9:15 AM Initial call taken by: Myrtis Hopping CMA [facility]),  Aug 15, 2009 9:48 AM

## 2010-05-08 NOTE — Miscellaneous (Signed)
Summary: Bridge Counselor  Clinical Lists Changes BC spoke with pt today regarding his medicaid hearing. BC received an email from Talmadge Chad requesting IQ test results. Pt is waiting on a call from [employer] to receive this test. Pt is currently on their waiting list. Pt will not receive a medicaid decision until IQ test results are submitted. BC will coordinate.  [name]  January 02, 2010 8:53 AM

## 2010-05-08 NOTE — Miscellaneous (Signed)
Summary: Office Visit ([facility])    Vital Signs:  Patient profile:   45 year old male Weight:      301.4 pounds Temp:     98.2 degrees F oral Pulse rate:   91 / minute Pulse rhythm:   regular Resp:     16 per minute BP sitting:   118 / 81  (left arm)  Vitals Entered By: Barbra Sarks RN (April 13, 2009 2:42 PM) CC: f/u 05, would like referral to [facility] forcase manager Is Patient Diabetic? Yes Did you bring your meter with you today? No Pain Assessment Patient in pain? no      CBG Result 170  Does patient need assistance? Functional Status Self care Ambulation Normal   CC:  f/u 05 and would like referral to [facility] forcase manager.  History of Present Illness: Pt attempting to get his disability because he is unable to work due to his uncontrolled DM.  He has been trying to get it under better control.  A nurse is working with him. He missed 2 days of his HIV meds. He has occasional diarrhea.  Preventive Screening-Counseling & Management  Alcohol-Tobacco     Alcohol drinks/day: 0     Smoking Status: never  Caffeine-Diet-Exercise     Caffeine use/day: 0     Does Patient Exercise: yes     Type of exercise: walks     Exercise (avg: min/session): >60     Times/week: <3  Current Problems (verified): 1)  Paronychia, Finger  (ICD-681.02) 2)  Dermatophytosis of Unspecified Site  (ICD-110.9) 3)  Erectile Dysfunction  (ICD-302.72) 4)  Venereal Wart  (ICD-078.11) 5)  Hypertension  (ICD-401.9) 6)  Diabetes Mellitus, Type II  (ICD-250.00) 7)  HIV Disease  (ICD-042)  Current Medications (verified): 1)  Truvada 200-300 Mg Tabs (Emtricitabine-Tenofovir) .... Take 1 Tablet By Mouth Once A Day 2)  Reyataz 300 Mg  Caps (Atazanavir Sulfate) .... Take 1 Tablet By Mouth Once A Day 3)  Norvir 100 Mg Caps (Ritonavir) .... Take 1 Capsule By Mouth Once A Day 4)  Dapsone 100 Mg  Tabs (Dapsone) .... Take 1 Tablet By Mouth Once A Day 5)  Glucophage 1000 Mg Tabs (Metformin Hcl) ....  Take 1 Tablet By Mouth Two Times A Day 6)  Lisinopril 20 Mg Tabs (Lisinopril) .... Take 1 Tablet By Mouth Once A Day 7)  Androgel 50 Mg/5gm Gel (Testosterone) .... Apply One Packet Q Day 8)  Norvir Tablets 100 Mg .... Take 1 By Mouth Daily. 9)  Podofilox 0.5 % Soln (Podofilox) .... Apply Twice A Day For 12 Hours Then Stop For 4 Days - May Repeat 3 Times 10)  Clotrimazole 1 % Crea (Clotrimazole) .... Apply Two Times A Day 11)  Viagra 50 Mg Tabs (Sildenafil Citrate) .... Use As Directed 12)  Lipitor 20 Mg .... Take One  Tablet By Mouth Daily. 13)  Novolin 70/30 70-30 % Susp (Insulin Isophane & Regular) .... 42 Units Before Breakfast and 36 Units Before Dinner 14)  Vicodin 5-500 Mg Tabs (Hydrocodone-Acetaminophen) .... Take 1 Tablet By Mouth Every 8 Hours As Needed  Allergies: 1)  ! Sulfa   Review of Systems  The patient denies fever, abdominal pain, melena, and hematochezia.     Physical Exam  General:  alert, well-nourished, and overweight-appearing.   Head:  normocephalic and atraumatic.   Mouth:  pharynx pink and moist.   Lungs:  normal breath sounds.  Medication Adherence: 04/13/2009   Adherence to medications reviewed with patient. Counseling to provide adequate adherence provided   Prevention For Positives: 04/13/2009   Safe sex practices discussed with patient. Condoms offered.                             Impression & Recommendations:  Problem # 1:  HIV DISEASE (ICD-042) Will obtain labs.  Discussed need to take his meds every day. He will f/u in 2 weeks. Diagnostics Reviewed:  CD4: [id number] (04/14/2008)   WBC: 4.1 (12/08/2008)   Hgb: 12.7 (12/08/2008)   HCT: 40.0 (12/08/2008)   Platelets: 240 (12/08/2008) HIV genotype: See Comment (12/14/2008)   HIV-1 RNA: 2700 (12/08/2008)   HBSAg: negative (11/21/2005)  Problem # 2:  DIABETES MELLITUS, TYPE II (ICD-250.00) Improving control. Pt encouraged to follow diet and exercise and monitor DM closely. His updated  medication list for this problem includes:    Glucophage 1000 Mg Tabs (Metformin hcl) .Marland Kitchen... Take 1 tablet by mouth two times a day    Lisinopril 20 Mg Tabs (Lisinopril) .Marland Kitchen... Take 1 tablet by mouth once a day    Novolin 70/30 70-30 % Susp (Insulin isophane & regular) .Marland Kitchen... 42 units before breakfast and 36 units before dinner  Other Orders: T-CD4SP ([facility]) (CD4SP) T-HIV Viral Load [phone]) T-Comprehensive Metabolic Panel (A999333) T-CBC w/Diff [id number]) Est. Patient Level III [id number])    Patient Instructions: 1)  Please schedule a follow-up appointment in 2 weeks.

## 2010-05-08 NOTE — Miscellaneous (Signed)
Summary: RW Update  Clinical Lists Changes  Observations: Added new observation of DATE1STVISIT: 09/20/2009 (11/01/2009 11:54)

## 2010-05-08 NOTE — Progress Notes (Signed)
Summary: Keith [name]- Adherence Counselor update  Phone Note Call from Patient   Caller: Keith [name]  Adherence Counselor  [phone] Summary of Call: Keith [name]  was at the patient's home.  The patient has not picked up his meds from [facility] so he has not medications.  His insulin is expired and he was having problems drawing his insulin up .  Keith [name] states he actually had drawn up air in the syringe.   He is not sure he was getting his insulin correctly prior to the visit.   He did show him how to correctly administer the insulin and he was instructed to pick up his meds from the pharmacy.    His blood sugar was elevated and the sliding scale dose was used.  Orland Mustard RN  Aug 11, 2009 11:37 AM  Initial call taken by: Orland Mustard RN,  Aug 11, 2009 11:37 AM

## 2010-05-08 NOTE — Miscellaneous (Signed)
Summary: [facility]  [facility]   Imported By: Bonner Puna 08/29/2009 16:03:22  _____________________________________________________________________  External Attachment:    Type:   Image     Comment:   External Document

## 2010-05-08 NOTE — Miscellaneous (Signed)
Summary: Home Care Providers: Medical Case Mgt. Referral  Home Care Providers: Medical Case Mgt. Referral   Imported By: Bonner Puna 07/26/2009 15:51:49  _____________________________________________________________________  External Attachment:    Type:   Image     Comment:   External Document

## 2010-05-08 NOTE — Miscellaneous (Signed)
Summary: VIP  Patient: Keith [name] Note: All result statuses are Final unless otherwise noted.  Tests: (1) VIP (Medications)   LLIMPORTMEDS              "Result Below..."       RESULT: TRUVADA TABS 200-300 MG*TAKE ONE (1) TABLET EACH DAY*09/16/2006*Last Refill: 12/18/2006*[id number]*******   LLIMPORTMEDS              "Result Below..."       RESULT: REYATAZ CAPS 150 MG*TAKE TWO CAPSULES EACH DAY*09/16/2006*Last Refill: 12/18/2006*[id number]*******   LLIMPORTMEDS              "Result Below..."       RESULT: NORVIR CAPS 100 MG*TAKE ONE (1) CAPSULE EACH DAY*09/16/2006*Last Refill: 12/18/2006*[id number]*******   LLIMPORTMEDS              "Result Below..."       RESULT: DAPSONE TABS 100 MG*TAKE ONE (1) TABLET EACH DAY*09/16/2006*Last Refill: 12/18/2006*[id number]*******   LLIMPORTALLS              NKDA***  Note: An exclamation mark (!) indicates a result that was not dispersed into the flowsheet. Document Creation Date: 02/05/2007 3:00 PM _______________________________________________________________________  (1) Order result status: Final Collection or observation date-time: 12/24/2006 Requested date-time: 12/24/2006 Receipt date-time:  Reported date-time: 12/24/2006 Referring Physician:   Ordering Physician:   Specimen Source:  Source: Charlyne Mom Order Number:  Lab site:

## 2010-05-08 NOTE — Assessment & Plan Note (Signed)
Summary: per Kennyth Lose f/u [mkj]   CC:  c/o sores on genital area x 4 days.  History of Present Illness: Pt c/o painful rash on his penis for 4 days. He states that he has not been sexually active.Marland KitchenHe thought it was due to some underware that he got a [employer]. No urethral discharge. He did not take his insulain today.  Preventive Screening-Counseling & Management  Alcohol-Tobacco     Alcohol drinks/day: 0     Smoking Status: never  Caffeine-Diet-Exercise     Caffeine use/day: occasional coffee     Does Patient Exercise: yes     Type of exercise: walks     Exercise (avg: min/session): >60     Times/week: <3  Hep-HIV-STD-Contraception     HIV Risk: no     Sun Exposure-Excessive: occasionally  Safety-Violence-Falls     Seat Belt Use: 100      Drug Use:  no.     Updated Prior Medication List: TRUVADA 200-300 MG TABS (EMTRICITABINE-TENOFOVIR) Take 1 tablet by mouth once a day REYATAZ 300 MG  CAPS (ATAZANAVIR SULFATE) Take 1 tablet by mouth once a day DAPSONE 100 MG  TABS (DAPSONE) Take 1 tablet by mouth once a day GLUCOPHAGE 1000 MG TABS (METFORMIN HCL) Take 1 tablet by mouth two times a day ANDROGEL 50 MG/5GM GEL (TESTOSTERONE) apply one packet q day NORVIR 100 MG TABS (RITONAVIR) Take 1 tablet by mouth once a day NOVOLIN 70/30 70-30 % SUSP (INSULIN ISOPHANE & REGULAR) take 70 units in the morning and 70 units at night/ pt also uses sliding scale * ASPIRIN 81 MG Take one tablet by mouth daily. LISINOPRIL 40 MG TABS (LISINOPRIL) Take 1 tablet by mouth once a day ZIDOVUDINE 300 MG TABS (ZIDOVUDINE) one tablet by mouth two times a day ATENOLOL 50 MG TABS (ATENOLOL) Take 1 tablet by mouth once a day PERCOCET 5-325 MG TABS (OXYCODONE-ACETAMINOPHEN) take one tablet by mouth every four hours as needed for pain MEROPENEM 1 GM SOLR (MEROPENEM) 2 gms IV every 8 hours for 41 days FLUCONAZOLE 100 MG TABS (FLUCONAZOLE) Take 1 tablet by mouth once a day VALTREX 500 MG TABS (VALACYCLOVIR  HCL) Take 1 tablet by mouth two times a day  Current Allergies (reviewed today): ! SULFA Past History:  Past Medical History: Last updated: 05/19/2008 HIV disease Diabetes mellitus, type II Hypertension  Review of Systems  The patient denies anorexia, fever, and weight loss.    Vital Signs:  Patient profile:   45 year old male Height:      69 inches (175.26 cm) Weight:      278.4 pounds (126.55 kg) BMI:     41.26 Temp:     97.6 degrees F (36.44 degrees C) oral Pulse rate:   69 / minute BP sitting:   135 / 94  (left arm)  Vitals Entered By: Myrtis Hopping CMA [facility]) (Aug 16, 2009 9:14 AM) CC: c/o sores on genital area x 4 days Is Patient Diabetic? Yes Did you bring your meter with you today? No Pain Assessment Patient in pain? yes     Location: genitals Intensity: 9 Type: burning Onset of pain  Constant Nutritional Status BMI of > 30 = obese Nutritional Status Detail appetite "not good"  Does patient need assistance? Functional Status Self care Ambulation Normal Comments no missed doses of meds per patient   Physical Exam  General:  alert, well-developed, well-nourished, and well-hydrated.   Head:  normocephalic and atraumatic.   Mouth:  pharynx  pink and moist.   Genitalia:  several lesions on head of penis  uncircumcised.      Impression & Recommendations:  Problem # 1:  HIV DISEASE (ICD-042) Will obtain labs in 6 weeks. Pt encouraged to take his meds every day. Orders: T-Chlamydia  Probe, urine [phone]) T-GC Probe, urine [phone]) T-RPR (Syphilis) [id number]) Est. Patient Level III [id number])  Problem # 2:  * GENITAL LESION will obtain RPR and GC and chlamydia probe treat for possible herpes or yeast infection with valtrex and fluconazole  Problem # 3:  DIABETES MELLITUS, TYPE II (ICD-250.00) Pt insturcted to take his meds every day. Adherence counselor is working with him at home. His updated medication list for this problem  includes:    Glucophage 1000 Mg Tabs (Metformin hcl) .Marland Kitchen... Take 1 tablet by mouth two times a day    Novolin 70/30 70-30 % Susp (Insulin isophane & regular) .Marland Kitchen... Take 70 units in the morning and 70 units at night/ pt also uses sliding scale    Lisinopril 40 Mg Tabs (Lisinopril) .Marland Kitchen... Take 1 tablet by mouth once a day  Medications Added to Medication List This Visit: 1)  Fluconazole 100 Mg Tabs (Fluconazole) .... Take 1 tablet by mouth once a day 2)  Valtrex 500 Mg Tabs (Valacyclovir hcl) .... Take 1 tablet by mouth two times a day  Other Orders: Future Orders: T-CD4SP ([facility]) (CD4SP) ... 09/27/2009 T-HIV Viral Load [phone]) ... 09/27/2009 T-Comprehensive Metabolic Panel ([id number]) ... 09/27/2009 T-CBC w/Diff [id number]) ... 09/27/2009 T-Lipid Profile [phone]) ... 09/27/2009  Patient Instructions: 1)  Please schedule a follow-up appointment in 8 weeks.  Prescriptions: FLUCONAZOLE 100 MG TABS (FLUCONAZOLE) Take 1 tablet by mouth once a day  #10 x 0   Entered and Authorized by:   Aldona Bar MD   Signed by:   Aldona Bar MD on 08/16/2009   Method used:   Print then Give to Patient   RxID:   [id number] VALTREX 500 MG TABS (VALACYCLOVIR HCL) Take 1 tablet by mouth two times a day  #14 x 0   Entered and Authorized by:   Aldona Bar MD   Signed by:   Aldona Bar MD on 08/16/2009   Method used:   Print then Give to Patient   RxID:   [id number]   Appended Document: per Kennyth Lose f/u [mkj] [name], the [facility] care nurse came by and picked up the new medications of fluconazole and Valtrex for Mr. Wroblewski. He had an appt with [name] today.

## 2010-05-08 NOTE — Letter (Signed)
Summary: Generic on Letterhead Paper  [facility]  [address]   [place], [place] [address]   Phone: [phone]  Fax: [phone]     Today's Date:  January 08, 2010  Re:  Keith [name]          Sincerely,    Barnabas Harries RD,CDE

## 2010-05-08 NOTE — Letter (Signed)
Summary: Results Letter  Results Letter   Imported By: Wyatt Mage 10/05/2007 10:38:23  _____________________________________________________________________  External Attachment:    Type:   Image     Comment:   External Document

## 2010-05-08 NOTE — Discharge Summary (Signed)
  Hospital Discharge  Date of admission:05/20/2009  Date of discharge:05/24/2009  Brief reason for admission/active problems:  -Hyperglycemia with no sign of DKA>> admitted for  observation, patient was stable on 2/8 and dc'd home with increased doses of 70/30 to 60 in am and 50 in pm...   Followup needed:  -Patient needs better control of DM as A1c is >13 (2/7). -Patient is resistant to component of truvada (emtricitabine) per latest HIV 1 Genotyping,  -Dr Ola Spurr evaluated him and added AZT to his home meds.     -The medication and problem lists have been updated.   -Please see the dictated discharge summary for details.   Patient Instructions: 1)  Please come for an appointment at the ID clinic on 2/17 at 3:30 pm with Dr. Tomma Lightning  for a followup visit. 2)  Please take your medication as prescribed below. 3)  If you have any problem, Please call the clinic.  4)  In case of an emergency  dial 911 or go to the emergency department.     Current Medications (verified): 1)  Truvada 200-300 Mg Tabs (Emtricitabine-Tenofovir) .... Take 1 Tablet By Mouth Once A Day 2)  Reyataz 300 Mg  Caps (Atazanavir Sulfate) .... Take 1 Tablet By Mouth Once A Day 3)  Norvir 100 Mg Caps (Ritonavir) .... Take 1 Capsule By Mouth Once A Day 4)  Dapsone 100 Mg  Tabs (Dapsone) .... Take 1 Tablet By Mouth Once A Day 5)  Glucophage 1000 Mg Tabs (Metformin Hcl) .... Take 1 Tablet By Mouth Two Times A Day 6)  Androgel 50 Mg/5gm Gel (Testosterone) .... Apply One Packet Q Day 7)  Norvir Tablets 100 Mg .... Take 1 By Mouth Daily. 8)  Podofilox 0.5 % Soln (Podofilox) .... Apply Twice A Day For 12 Hours Then Stop For 4 Days - May Repeat 3 Times 9)  Clotrimazole 1 % Crea (Clotrimazole) .... Apply Two Times A Day 10)  Viagra 50 Mg Tabs (Sildenafil Citrate) .... Use As Directed 11)  Lipitor 20 Mg .... Take One  Tablet By Mouth Daily. 12)  Novolin 70/30 70-30 % Susp (Insulin Isophane & Regular) .... 60 Units Before  Breakfast and 50 Units Before Dinner 13)  Vicodin 5-500 Mg Tabs (Hydrocodone-Acetaminophen) .... Take 1 Tablet By Mouth Every 8 Hours As Needed 14)  Aspirin 81 Mg .... Take One Tablet By Mouth Daily. 15)  Lisinopril 40 Mg Tabs (Lisinopril) .... Take 1 Tablet By Mouth Once A Day 16)  Zidovudine 300 Mg Tabs (Zidovudine) .... One Tablet By Mouth Two Times A Day  Allergies: 1)  ! Sulfa

## 2010-05-08 NOTE — Progress Notes (Signed)
Summary: update on patient  Phone Note Other Incoming   Caller: [name], home health nurse [phone] Details for Reason: update on patient Summary of Call: [name], pts. home health nurse called to let Dr. Tomma Lightning know of pts. continued noncompliance.  Last CBG was 515. Pt. may be losing power for unpaid bill and may also lose housing. Initial call taken by: Myrtis Hopping CMA [facility]),  Sep 05, 2009 12:14 PM  Follow-up for Phone Call        ok Follow-up by: Aldona Bar MD,  Sep 05, 2009 2:29 PM

## 2010-05-08 NOTE — Progress Notes (Signed)
Summary: phone call  Phone Note Other Incoming   Caller: [facility] RN Lupita Raider [phone] Summary of Call: RN called to state pt's bp was 130/100 last week and 130/98 this week. She states previously he had not taken his BP but did last week. ML for RN that pt. is not always compliant with his meds. Initial call taken by: Barbra Sarks RN,  April 21, 2009 1:02 PM

## 2010-05-08 NOTE — Miscellaneous (Signed)
Summary: Office Visit ([facility])    Vital Signs:  Patient profile:   45 year old male Weight:      318. pounds Temp:     97.9 degrees F oral Pulse rate:   97 / minute Pulse rhythm:   regular Resp:     20 per minute BP sitting:   130 / 83  (left arm)  Vitals Entered By: Barbra Sarks RN (January 05, 2009 10:19 AM) CC: f/u 05 Is Patient Diabetic? Yes  Pain Assessment Patient in pain? no      CBG Result 487  Does patient need assistance? Functional Status Self care Ambulation Normal   CC:  f/u 05.  History of Present Illness: Pt here for f/u.  He was drinking sodas - he has stopped and is going back to crystal light.  He is going to start exercising again.  Preventive Screening-Counseling & Management  Alcohol-Tobacco     Alcohol drinks/day: 0     Smoking Status: never  Caffeine-Diet-Exercise     Caffeine use/day: 0     Does Patient Exercise: yes     Type of exercise: walks     Exercise (avg: min/session): >60     Times/week: <3  Current Problems (verified): 1)  Dermatophytosis of Unspecified Site  (ICD-110.9) 2)  Erectile Dysfunction  (ICD-302.72) 3)  Venereal Wart  (ICD-078.11) 4)  Hypertension  (ICD-401.9) 5)  Diabetes Mellitus, Type II  (ICD-250.00) 6)  HIV Disease  (ICD-042)  Current Medications (verified): 1)  Truvada 200-300 Mg Tabs (Emtricitabine-Tenofovir) .... Take 1 Tablet By Mouth Once A Day 2)  Reyataz 300 Mg  Caps (Atazanavir Sulfate) .... Take 1 Tablet By Mouth Once A Day 3)  Norvir 100 Mg Caps (Ritonavir) .... Take 1 Capsule By Mouth Once A Day 4)  Dapsone 100 Mg  Tabs (Dapsone) .... Take 1 Tablet By Mouth Once A Day 5)  Glucophage 1000 Mg Tabs (Metformin Hcl) .... Take 1 Tablet By Mouth Two Times A Day 6)  Glucotrol 10 Mg Tabs (Glipizide) .... Take 1 Tablet By Mouth Once A Day 7)  Lisinopril 20 Mg Tabs (Lisinopril) .... Take 1 Tablet By Mouth Once A Day 8)  Androgel 50 Mg/5gm Gel (Testosterone) .... Apply One Packet Q Day 9)   Norvir Tablets 100 Mg .... Take 1 By Mouth Daily. 10)  Podofilox 0.5 % Soln (Podofilox) .... Apply Twice A Day For 12 Hours Then Stop For 4 Days - May Repeat 3 Times 11)  Clotrimazole 1 % Crea (Clotrimazole) .... Apply Two Times A Day 12)  Viagra 50 Mg Tabs (Sildenafil Citrate) .... Use As Directed 13)  Lipitor 20 Mg .... Take One  Tablet By Mouth Daily.  Allergies: 1)  ! Sulfa   Review of Systems  The patient denies anorexia and fever.     Physical Exam  General:  alert, well-hydrated, and overweight-appearing.   Head:  normocephalic and atraumatic.   Mouth:  pharynx pink and moist.   Lungs:  normal breath sounds.     Impression & Recommendations:  Problem # 1:  DIABETES MELLITUS, TYPE II (ICD-250.00)  slowly improving - will give him 10 units of insulin today.  He will return in one week.  Encouraged him to follow his diet and to exercsie. His updated medication list for this problem includes:    Glucophage 1000 Mg Tabs (Metformin hcl) .Marland Kitchen... Take 1 tablet by mouth two times a day    Glucotrol 10 Mg Tabs (Glipizide) .Marland Kitchen... Take 1  tablet by mouth once a day    Lisinopril 20 Mg Tabs (Lisinopril) .Marland Kitchen... Take 1 tablet by mouth once a day  Orders: Insulin 5 units [id number]) Admin of Therapeutic Inj  intramuscular or subcutaneous [id number])  Problem # 2:  HIV DISEASE (ICD-042)  pt encouraged to take his meds every day. Influenza vaccine given.  Diagnostics Reviewed:  CD4: [id number] (04/14/2008)   WBC: 4.1 (12/08/2008)   Hgb: 12.7 (12/08/2008)   HCT: 40.0 (12/08/2008)   Platelets: 240 (12/08/2008) HIV genotype: See Comment (12/14/2008)   HIV-1 RNA: 2700 (12/08/2008)   HBSAg: negative (11/21/2005)  Other Orders: Influenza Vaccine NON MCR [id number])   Patient Instructions: 1)  Please schedule a follow-up appointment in 1 week.   Medication Administration  Injection # 1:    Medication: Insulin 5 units    Diagnosis: DIABETES MELLITUS, TYPE II (ICD-250.00)    Route: SQ    Site: R  deltoid    Exp Date: 11/26/2010    Lot #: [id number]    Mfr: Novo Nordisk    Comments: Total of 10 units regular insulin given.    Patient tolerated injection without complications  Orders Added: 1)  Insulin 5 units [[id number]] 2)  Admin of Therapeutic Inj  intramuscular or subcutaneous [96372] 3)  Influenza Vaccine NON MCR [[id number]]    Influenza Vaccine    Vaccine Type: Fluvax Non-MCR    Site: left deltoid    Mfr: Sanofi Pasteur    Dose: 0.5 ml    Route: IM    Given by: Barbra Sarks RN    Exp. Date: 10/05/2009    Lot #: [id number]    VIS given: 10/30/06 version given January 05, 2009.  Flu Vaccine Consent Questions    Do you have a history of severe allergic reactions to this vaccine? no    Any prior history of allergic reactions to egg and/or gelatin? no    Do you have a sensitivity to the preservative Thimersol? no    Do you have a past history of Guillan-Barre Syndrome? no    Do you currently have an acute febrile illness? no    Have you ever had a severe reaction to latex? no    Vaccine information given and explained to patient? yes

## 2010-05-08 NOTE — Progress Notes (Signed)
Summary: call  Phone Note Call from Patient Call back at Home Phone [phone]   Caller: Patient Reason for Call: Talk to Nurse Summary of Call: Patient called stating he still has frequent urination and denies pain and urine is clear. He states he feels"good" and has been drinking sugar free Crystal Light with water instead of Pepsi. Patient advised if he has ben drinking more water than usual that urinating 4-5x a day is okay. Patient to keep appt. on 10/7 and call if symptoms change. Initial call taken by: Barbra Sarks RN,  January 09, 2009 2:55 PM

## 2010-05-08 NOTE — Progress Notes (Signed)
Summary: RX for Viagra  Phone Note Call from Patient   Caller: Patient Summary of Call: pt. came into clinic to pick up meds from [facility] and wanted an RX for Viagra, was told to discuss this with Dr. Tomma Lightning at next office visit. Initial call taken by: Myrtis Hopping CMA [facility]),  December 05, 2009 10:42 AM

## 2010-05-08 NOTE — Miscellaneous (Signed)
  Clinical Lists Changes  Medications: Rx of LANTUS 100 UNIT/ML SOLN (INSULIN GLARGINE) 60 units one time a day;  #1 vial x 5;  Signed;  Entered by: Aldona Bar MD;  Authorized by: Aldona Bar MD;  Method used: Print then Give to Patient    Prescriptions: LANTUS 100 UNIT/ML SOLN (INSULIN GLARGINE) 60 units one time a day  #1 vial x 5   Entered and Authorized by:   Aldona Bar MD   Signed by:   Aldona Bar MD on 10/30/2009   Method used:   Print then Give to Patient   RxID:   [id number]

## 2010-05-08 NOTE — Miscellaneous (Signed)
Summary: Bridge Counselor  Clinical Lists Changes BC submitted all mental health records to Talmadge Chad at [facility] to be considered for pt's medicaid determination.  [name]  December 20, 2009 10:16 AM

## 2010-05-08 NOTE — Assessment & Plan Note (Signed)
Summary: DIABETES CHECKUP/CFB   Vital Signs:  Patient profile:   45 year old male Weight:      262 pounds BMI:     38.83 Is Patient Diabetic? Yes Did you bring your meter with you today? No CBG Result 307 CBG Device ID office meter Comments chicken noodle soup around 11 pm last night- 1 can   Allergies: 1)  ! Sulfa   Complete Medication List: 1)  Truvada 200-300 Mg Tabs (Emtricitabine-tenofovir) .... Take 1 tablet by mouth once a day 2)  Reyataz 300 Mg Caps (Atazanavir sulfate) .... Take 1 tablet by mouth once a day 3)  Dapsone 100 Mg Tabs (Dapsone) .... Take 1 tablet by mouth once a day 4)  Glucophage 1000 Mg Tabs (Metformin hcl) .... Take 1 tablet by mouth two times a day 5)  Androgel 50 Mg/5gm Gel (Testosterone) .... Apply one packet q day 6)  Norvir 100 Mg Tabs (Ritonavir) .... Take 1 tablet by mouth once a day 7)  Aspirin 81 Mg  .... Take one tablet by mouth daily. 8)  Lisinopril 40 Mg Tabs (Lisinopril) .... Take 1 tablet by mouth once a day 9)  Zidovudine 300 Mg Tabs (Zidovudine) .... One tablet by mouth two times a day 10)  Atenolol 50 Mg Tabs (Atenolol) .... Take 1 tablet by mouth once a day 11)  Lantus 100 Unit/ml Soln (Insulin glargine) .... 60 units one time a day  Other Orders: DSMT(Medicare) Individual, 30 Minutes (G0108)  Patient Instructions: 1)  make an appointment for 1 month 2)  please bring your meter and insulin next visit 3)  YOur goal for the next month: 4)  take insulin every day- mark on chart when you've taken it and bring chart back with you next visit   Diabetes Self Management Training  Date diagnosed with diabetes: 05/09/2006 Diabetes Type: Type 2 insulin Other persons present: no Current smoking Status: never  Vital Signs Todays Weight: 262lb  in BMI 38.83in-lbs   Diabetes Medications:  Comments: he did not bring his meter, says he's taking his insulin most of the time. His focus today is getting his weight down. we discussed a goal  fo 250 and then later he could gradually decrease to 200-225#. tells me he was i the hospiutal for an infection in his finger. Note on 60 units his CBgs in hosputal came down to low 100s.   Long Acting  Insulin Type:Lantus Breakfast Dose: 60 units     Monitoring Self monitoring blood glucose 2 times a day Name of Meter  office meter     Midafternoon # of Carbs/Grams 10 am- before he went to his mothers, peanut sandwich on wheat bred, [employer] # of Carbs/Grams 1 pm= sub mom bought for him-  [place] 6 ", diet soda Bedtime # of Carbs/Grams 11pm= chicken noodle soup  Nutrition assessment Weight change: Loss Desired Weight: 250 ETOH : No Who does the food shopping? mom Who does the cooking?  he does Do you add salt to food? No Do you read food labels?                                                                          Yes- he can  he is learning to   Type of physical activity  Other  basket ball most days of the week Diabetes Disease Process  Discussed today State own type of diabetes: Needs review/assistance Medications State name-action-dose-duration-side effects-and time to take medication: Demonstrates competency Nutritional Management Identify what foods most often affect blood glucose: Needs review/assistance    Verbalize importance of controlling food portions: Needs review/assistance   State importance of spacing and not omitting meals and snacks: Needs review/assistance   State changes planned for home meals/snacks: Needs review/assistance    Monitoring  Complications Explain proper treatment of hyperglycemia: Needs review/assistance Exercise States importance of exercise: Needs review/assistanceDiabetes Management Education Done: 12/08/2009    BEHAVIORAL GOALS INITIAL Utilizing medications if for therapeutic effectiveness: check off chart when you've taken your insulin and brin chart back with you    BEHAVIORAL GOAL FOLLOW UP Utilizing  medications if for therapeutic effectiveness: Most of the time      A1C is due- will request at next visit.   Unsure if patient met with Marlana Latus or how he is getting testing supplies/ insulin and syringes.   Current seems to be appropriate for now- difficult to assess without CBgs/A1C. Patient interest was in foods and label reading stoday. He reports mother and sister both eat healthy and mother shops for him and buys healthy foods.  Diabetes Self Management Support: home health and Bridge counseling case manager, ID office Follow-up:1 month

## 2010-05-08 NOTE — Progress Notes (Signed)
Summary: Care Plan Oversight  Phone Note Outgoing Call   Call placed by: Alcide Evener MD,  July 27, 2009 8:54 AM Details for Reason: Care Plan Oversight Summary of Call: [phone] mins) [phone] (30 or more mins) [phone](> 60 mins) I have supervised home care and/or infusion therapy for this pt, including providing orders for care, review of labs and/or home health care plans, communicating with the home health care professionals and/or patient/caregivers to integrate current information into the medical treatment plan and/or adjust the medical therapy. This supervision has been provided for 32__minutes during the calendar month. Dates for this oversight 07/26/09 thru 08/24/09.   Initial call taken by: Alcide Evener MD,  July 27, 2009 8:55 AM  New Problems: INF DUE OTH GM-NEGATIVE ORGANISMS CCE & UNS SITE (ICD-041.85) STREP INF CCE & UNS SITE GROUP D [ENTEROCOCCUS] (ICD-041.04) UNSPECIFIED CELLULITIS AND ABSCESS OF FINGER (ICD-681.00) OSTEOMYELITIS, HAND (ICD-730.24)   New Problems: INF DUE OTH GM-NEGATIVE ORGANISMS CCE & UNS SITE (ICD-041.85) STREP INF CCE & UNS SITE GROUP D [ENTEROCOCCUS] (ICD-041.04) UNSPECIFIED CELLULITIS AND ABSCESS OF FINGER (ICD-681.00) OSTEOMYELITIS, HAND (ICD-730.24)

## 2010-05-08 NOTE — Miscellaneous (Signed)
Summary: Bridge Counselor  Clinical Lists Changes BC received a letter from [facility] today stating that pt has been approved for Medicaid. BC gave a copy of pt's approval letter to Irish Elders for her records. As soon as pt receives his medicaid card he will call [facility] for additional assistance. BC also spoke with pt about an assisted living facility and pt stated that "he will think about it."

## 2010-05-08 NOTE — Miscellaneous (Signed)
Summary: Office Visit ([facility])    Vital Signs:  Patient profile:   45 year old male Weight:      329 pounds Temp:     98.2 degrees F oral Pulse rate:   72 / minute Pulse rhythm:   regular Resp:     18 per minute BP sitting:   118 / 76  (left arm)  Vitals Entered By: Barbra Sarks RN (Aug 22, 2008 2:52 PM) CC: ?boil under right arm, wants rx for Viagra Is Patient Diabetic? Yes  Pain Assessment Patient in pain? yes     Location: right arm Intensity: 5 Type: aching CBG Result 97  Does patient need assistance? Functional Status Self care   CC:  ?boil under right arm and wants rx for Viagra.  History of Present Illness: Pt c/o stinging rash in right axilla.  He also c/o ED.  The androgel was not working so he stopped it.  Preventive Screening-Counseling & Management     Alcohol drinks/day: 0     Smoking Status: never     Caffeine use/day: 0     Does Patient Exercise: yes     Type of exercise: walks     Exercise (avg: min/session): >60     Times/week: 7  Current Problems (verified): 1)  Dermatophytosis of Unspecified Site  (ICD-110.9) 2)  Erectile Dysfunction  (ICD-302.72) 3)  Venereal Wart  (ICD-078.11) 4)  Hypertension  (ICD-401.9) 5)  Diabetes Mellitus, Type II  (ICD-250.00) 6)  HIV Disease  (ICD-042)  Current Medications (verified): 1)  Truvada 200-300 Mg Tabs (Emtricitabine-Tenofovir) .... Take 1 Tablet By Mouth Once A Day 2)  Reyataz 300 Mg  Caps (Atazanavir Sulfate) .... Take 1 Tablet By Mouth Once A Day 3)  Norvir 100 Mg Caps (Ritonavir) .... Take 1 Capsule By Mouth Once A Day 4)  Dapsone 100 Mg  Tabs (Dapsone) .... Take 1 Tablet By Mouth Once A Day 5)  Glucophage 1000 Mg Tabs (Metformin Hcl) .... Take 1 Tablet By Mouth Two Times A Day 6)  Glucotrol 10 Mg Tabs (Glipizide) .... Take 1 Tablet By Mouth Once A Day 7)  Lisinopril 20 Mg Tabs (Lisinopril) .... Take 1 Tablet By Mouth Once A Day 8)  Androgel 50 Mg/5gm Gel (Testosterone) .... Apply One  Packet Q Day 9)  Norvir Tablets 100 Mg .... Take 1 By Mouth Daily. 10)  Podofilox 0.5 % Soln (Podofilox) .... Apply Twice A Day For 12 Hours Then Stop For 4 Days - May Repeat 3 Times 11)  Viagra 25 Mg Tabs (Sildenafil Citrate) .... Use As Directed 12)  Clotrimazole 1 % Crea (Clotrimazole) .... Apply Two Times A Day  Allergies (verified): 1)  ! Sulfa   Review of Systems  The patient denies anorexia, fever, and weight loss.     Physical Exam  General:  alert, well-developed, well-nourished, well-hydrated, and overweight-appearing.   Head:  normocephalic and atraumatic.   Mouth:  pharynx pink and moist.   Lungs:  no dullness.   Additional Exam:  hyperpigmented rash in right axilla    Impression & Recommendations:  Problem # 1:  HIV DISEASE (ICD-042) Pt.s most recent CD4ct was 209 and VL 231 .  Pt instructed to continue the current antiretroviral regimen.  Pt encouraged to take medication regularly and not miss doses.  Pt will f/u in 3 months for repeat blood work and will see me 2 weeks later.  Diagnostics Reviewed:  CD4: 198 (04/14/2008)   WBC: 7.9 (08/11/2008)  Hgb: 13.6 (08/11/2008)   HCT: 41.7 (08/11/2008)   Platelets: 250 (08/11/2008) HIV-1 RNA: 231 (08/11/2008)   HBSAg: negative (11/21/2005)  Problem # 2:  DERMATOPHYTOSIS OF UNSPECIFIED SITE (ICD-110.9) clotrimazole cream  Problem # 3:  ERECTILE DYSFUNCTION (ICD-302.72) Testosterone level low - replace with androgel and take viagra as needed His updated medication list for this problem includes:    Viagra 25 Mg Tabs (Sildenafil citrate) ..... Use as directed  Medications Added to Medication List This Visit: 1)  Viagra 25 Mg Tabs (Sildenafil citrate) .... Use as directed 2)  Clotrimazole 1 % Crea (Clotrimazole) .... Apply two times a day  Other Orders: Est. Patient Level III [id number]) Future Orders: T-CD4 [id number]) ... 11/20/2008 T-HIV Viral Load [phone]) ... 11/20/2008 T-Comprehensive Metabolic Panel  ([id number]) ... 11/20/2008 T-CBC w/Diff [id number]) ... 11/20/2008    Patient Instructions: 1)  Please schedule a follow-up appointment in 3 months, 2 weeks after labs. Prescriptions: CLOTRIMAZOLE 1 % CREA (CLOTRIMAZOLE) apply two times a day  #60gm x 1   Entered and Authorized by:   Aldona Bar MD   Signed by:   Aldona Bar MD on 08/22/2008   Method used:   Print then Give to Patient   RxID:   [id number] VIAGRA 25 MG TABS (SILDENAFIL CITRATE) use as directed  #10 x 5   Entered and Authorized by:   Aldona Bar MD   Signed by:   Aldona Bar MD on 08/22/2008   Method used:   Print then Give to Patient   RxID:   [id number] ANDROGEL 50 MG/5GM GEL (TESTOSTERONE) apply one packet q day  #30 packets x 5   Entered and Authorized by:   Aldona Bar MD   Signed by:   Aldona Bar MD on 08/22/2008   Method used:   Print then Give to Patient   RxID:   [id number]

## 2010-05-08 NOTE — Miscellaneous (Signed)
Summary: Medication update

## 2010-05-08 NOTE — Progress Notes (Signed)
Summary: confirming appts.  Phone Note Outgoing Call   Call placed by: Barbra Sarks RN,  May 24, 2009 9:43 AM Call placed to: Patient Reason for Call: Confirm/change Appt Summary of Call: Pt. reports he is  to be discharged from the hospital today. Pt. advised of appt. with Marcie Bal, the CDE tomorrow at 2 pm with an appt. after to see Dr. Tomma Lightning. Pt. advised to bring a list of questions and his new diabetes book to his appt. Pt. verbalized understanding. Initial call taken by: Barbra Sarks RN,  May 24, 2009 9:45 AM

## 2010-05-08 NOTE — Progress Notes (Signed)
Summary: med request  Phone Note Call from Patient   Summary of Call: Pt calling for refill of diabetic meds. Name of pharmacy needed.  Orland Mustard RN  January 16, 2010 11:31 AM  Initial call taken by: Orland Mustard RN,  January 16, 2010 11:31 AM    Prescriptions: GLUCOPHAGE 1000 MG TABS (METFORMIN HCL) Take 1 tablet by mouth two times a day  #60 x 5   Entered by:   Orland Mustard RN   Authorized by:   Aldona Bar MD   Signed by:   Orland Mustard RN on 01/16/2010   Method used:   Electronically to        [facility] [phone]* (retail)       [address]       [place], [place]  [address]       Ph: [phone]       Fax: [phone]   RxID:   [id number]

## 2010-05-08 NOTE — Progress Notes (Signed)
Summary: Med Concerns  Phone Note Call from Patient   Summary of Call: pt is requesting in increase on Viagra. pt states he has to take twp pills in order for them to work, please reply to [place] Initial call taken by: Rhunette Croft,  September 06, 2008 2:57 PM  Follow-up for Phone Call        pt needs to be taking androgel request denied Follow-up by: Aldona Bar MD,  September 06, 2008 3:01 PM    Patient called and advised of the above. He states that he is out of Androgel and the pharmacy will not have any for at least 3 weeks.(4-6 weeks per the pharmacy) He wants to increase his dosage while waiting. He states his wart is not going away and is applying the Podofilox as directed. Barbra Sarks RN  September 07, 2008 9:07 AM

## 2010-05-08 NOTE — Progress Notes (Signed)
Summary: pt notified to go to ER  Phone Note Outgoing Call   Call placed by: Kennyth Lose Call placed to: Patient Details for Reason: notified pt. to go to ER Summary of Call: per Dr. Tomma Lightning called pt. to go directly to ER for elevated glucose of 878.  Pt. has driver.  ER notified, pt. to be admitted. Myrtis Hopping CMA [facility])  July 20, 2009 12:35 PM  Initial call taken by: Myrtis Hopping CMA [facility]),  July 20, 2009 12:35 PM

## 2010-05-08 NOTE — Progress Notes (Signed)
Summary: pain med  Phone Note Outgoing Call   Call placed by: Barbra Sarks RN,  March 06, 2009 2:41 PM Call placed to: Patient Action Taken: Phone Call Completed Summary of Call: Patient called at [phone] and advised MD will call in Vicodin script  this one time. but no Oxycodone. Pt. advised again to contact his surgeon re: his finger. Vicodin 5/500 mg take one tablet by mouth every 6-8 hrs. as needed pain. #30 no refills called to [facility] per Dr.Marbeth Smedley. Initial call taken by: Barbra Sarks RN,  March 06, 2009 2:59 PM    New/Updated Medications: * VICODIN 5/500 MG Take one tablet every 6-8 hrs. as needed for pain

## 2010-05-08 NOTE — Miscellaneous (Signed)
Summary: [facility]: Orders  [facility]: Orders   Imported By: Bonner Puna 09/01/2009 15:04:04  _____________________________________________________________________  External Attachment:    Type:   Image     Comment:   External Document

## 2010-05-08 NOTE — Miscellaneous (Signed)
Summary: Bridge Counselor  Clinical Lists Changes BC phone [facility] to follow-up with client's IQ testing.  BC spoke with Ms. Myrtle and was informed that client is number 10 on the waiting list.  Client probably will have to wait 8 to 12 weeks for an IQ test appt but the director of the psychology center will try and see if she can get client in for an appt within the next 4 weeks.

## 2010-05-08 NOTE — Miscellaneous (Signed)
Summary: Office Visit ([facility])    Vital Signs:  Patient profile:   45 year old male Weight:      313.6 pounds Temp:     98.1 degrees F oral Pulse rate:   108 / minute Pulse rhythm:   regular Resp:     18 per minute BP sitting:   118 / 78  (left arm)  Vitals Entered By: Barbra Sarks RN (January 12, 2009 2:10 PM) CC: f/u 05, f/u elevatedcbg's Is Patient Diabetic? Yes  CBG Result >600  Does patient need assistance? Functional Status Self care Ambulation Normal Comments cbh>600   CC:  f/u 05 and f/u elevatedcbg's.  History of Present Illness: Pt did not take his meds today and has not had an eligibility appt yet.  He also c/o infected finger.  Preventive Screening-Counseling & Management  Alcohol-Tobacco     Alcohol drinks/day: 0     Smoking Status: never  Caffeine-Diet-Exercise     Caffeine use/day: 0     Does Patient Exercise: yes     Type of exercise: walks     Exercise (avg: min/session): >60     Times/week: <3  Current Medications (verified): 1)  Truvada 200-300 Mg Tabs (Emtricitabine-Tenofovir) .... Take 1 Tablet By Mouth Once A Day 2)  Reyataz 300 Mg  Caps (Atazanavir Sulfate) .... Take 1 Tablet By Mouth Once A Day 3)  Norvir 100 Mg Caps (Ritonavir) .... Take 1 Capsule By Mouth Once A Day 4)  Dapsone 100 Mg  Tabs (Dapsone) .... Take 1 Tablet By Mouth Once A Day 5)  Glucophage 1000 Mg Tabs (Metformin Hcl) .... Take 1 Tablet By Mouth Two Times A Day 6)  Glucotrol 10 Mg Tabs (Glipizide) .... Take 1 Tablet By Mouth Once A Day 7)  Lisinopril 20 Mg Tabs (Lisinopril) .... Take 1 Tablet By Mouth Once A Day 8)  Androgel 50 Mg/5gm Gel (Testosterone) .... Apply One Packet Q Day 9)  Norvir Tablets 100 Mg .... Take 1 By Mouth Daily. 10)  Podofilox 0.5 % Soln (Podofilox) .... Apply Twice A Day For 12 Hours Then Stop For 4 Days - May Repeat 3 Times 11)  Clotrimazole 1 % Crea (Clotrimazole) .... Apply Two Times A Day 12)  Viagra 50 Mg Tabs (Sildenafil Citrate) ....  Use As Directed 13)  Lipitor 20 Mg .... Take One  Tablet By Mouth Daily. 14)  Doxycycline Hyclate 100 Mg Caps (Doxycycline Hyclate) .... Take 1 Capsule By Mouth Two Times A Day  Allergies: 1)  ! Sulfa    Physical Exam  General:  alert, well-hydrated, and overweight-appearing.   Head:  normocephalic and atraumatic.   Msk:  swollen finger on left hand near the nail no pus draining   Impression & Recommendations:  Problem # 1:  DIABETES MELLITUS, TYPE II (ICD-250.00)  Will give pt 15 units of insulin and have him drink water and re-check him a couple of hours after his eligibility appt.  I emphasized the need to take his meds and complete his eligibility appt.  Without his eligibility he will not be able to be seen here or get his meds. His updated medication list for this problem includes:    Glucophage 1000 Mg Tabs (Metformin hcl) .Marland Kitchen... Take 1 tablet by mouth two times a day    Glucotrol 10 Mg Tabs (Glipizide) .Marland Kitchen... Take 1 tablet by mouth once a day    Lisinopril 20 Mg Tabs (Lisinopril) .Marland Kitchen... Take 1 tablet by mouth once a day  BS checked 2 hours later down slightly to 597- 10 more untis of insulin given to patient.  He is to return next week.  Orders: Insulin 5 units [id number]) Admin of Therapeutic Inj  intramuscular or subcutaneous [id number])  Problem # 2:  PARONYCHIA, FINGER (ICD-681.02) doxycycline 100mg  two times a day for 10 days soak in warm water  Medications Added to Medication List This Visit: 1)  Doxycycline Hyclate 100 Mg Caps (Doxycycline hyclate) .... Take 1 capsule by mouth two times a day   Patient Instructions: 1)  Please schedule a follow-up appointment in 1 week. Prescriptions: DOXYCYCLINE HYCLATE 100 MG CAPS (DOXYCYCLINE HYCLATE) Take 1 capsule by mouth two times a day  #20 x 0   Entered and Authorized by:   Aldona Bar MD   Signed by:   Aldona Bar MD on 01/12/2009   Method used:   Print then Give to Patient   RxID:   [id number]   Laboratory  Results   Blood Tests   Date/Time Received: 01/12/09 210 pm  CBG Random:: >600mg /dL       Medication Administration  Injection # 1:    Medication: Insulin 5 units    Diagnosis: DIABETES MELLITUS, TYPE II (ICD-250.00)    Route: SQ    Site: R deltoid    Exp Date: 11/07/2010    Lot #: [id number]    Mfr: Novo Nordisk    Comments: Pt. Given 15 units of insulin on 01/12/09 at 233 pm.    Patient tolerated injection without complications    Given by: Barbra Sarks RN (January 13, 2009 12:29 PM)  Orders Added: 1)  Insulin 5 units [[id number]] 2)  Admin of Therapeutic Inj  intramuscular or subcutaneous [id number]

## 2010-05-08 NOTE — Miscellaneous (Signed)
Summary: [facility]   Imported By: Bonner Puna 08/04/2009 14:08:21  _____________________________________________________________________  External Attachment:    Type:   Image     Comment:   External Document

## 2010-05-08 NOTE — Progress Notes (Signed)
Summary: PPD  Phone Note Outgoing Call   Call placed by: Kennyth Lose Summary of Call: Pt. is due for PPD at next office visit Initial call taken by: Myrtis Hopping CMA [facility]),  December 26, 2009 3:53 PM

## 2010-05-08 NOTE — Progress Notes (Signed)
Summary: Pt. in [facility] ED, Care Manager to develop plan w/ the pt.  Phone Note Other Incoming   Caller: [place], in ED w/ pt.  Reason for Call: Get patient information Summary of Call: Pt. in ED this AM.  Care Manager called due to pts repeated visits to the ED over the past few months.  Diabetes/elevated Blood sugars main reason for ED visits.  Pt. now requesting placement in Assisted Living because he can't take care of himself.  Care Manager wanted to know what was happening for the pt. from the Center's perspective.  RN shared that the pt. is non-compliant with keeping Center appointments, taking medications for AIDS and diabetes and with keeping appts. with the Treatment Adherence RN from [facility].  [place] to talk with pt. and develop a plan with the pt.  Lorne Skeens RN  October 25, 2009 11:47 AM

## 2010-05-08 NOTE — Miscellaneous (Signed)
Summary: Bridge Counselor  Clinical Lists Changes BC learned today that pt never pickd up his medications from [place] after he told me he did. Pt also went to the ED again and told the ED physician that he did not know how to administer his insulin. BC witnessed pt injecting himself after arranging payment for the insulin. Pt DOES know how to check his BS and administer his insulin. Community resources were used to purchase his insulin and supplies. Pt has access to his medications and everything he needs. He is choosing not to take his medications or insulin.   [name]  November 16, 2009 10:32 AM

## 2010-05-08 NOTE — Miscellaneous (Signed)
Summary: Office Visit ([facility])    Vital Signs:  Patient profile:   45 year old male Weight:      320.4 pounds Temp:     98.1 degrees F oral Pulse rate:   107 / minute Pulse rhythm:   regular Resp:     22 per minute BP sitting:   142 / 92  (left arm)  Vitals Entered By: Barbra Sarks RN (January 02, 2009 3:22 PM) CC: "feeling Bad" c/o urine frequency Is Patient Diabetic? Yes  Pain Assessment Patient in pain? no      CBG Result 592  Does patient need assistance? Functional Status Self care Ambulation Normal   CC:  "feeling Bad" c/o urine frequency.  History of Present Illness: Pt c/o increased thirst and urination for the last couple of weeks.  He feels weak.  He has been drinking a lot of regular soda recently.  He states that he has been taking his diabetes medication.  Preventive Screening-Counseling & Management  Alcohol-Tobacco     Alcohol drinks/day: 0     Smoking Status: never  Caffeine-Diet-Exercise     Caffeine use/day: 0     Does Patient Exercise: yes     Type of exercise: walks     Exercise (avg: min/session): >60     Times/week: <3  Current Problems (verified): 1)  Dermatophytosis of Unspecified Site  (ICD-110.9) 2)  Erectile Dysfunction  (ICD-302.72) 3)  Venereal Wart  (ICD-078.11) 4)  Hypertension  (ICD-401.9) 5)  Diabetes Mellitus, Type II  (ICD-250.00) 6)  HIV Disease  (ICD-042)  Current Medications (verified): 1)  Truvada 200-300 Mg Tabs (Emtricitabine-Tenofovir) .... Take 1 Tablet By Mouth Once A Day 2)  Reyataz 300 Mg  Caps (Atazanavir Sulfate) .... Take 1 Tablet By Mouth Once A Day 3)  Norvir 100 Mg Caps (Ritonavir) .... Take 1 Capsule By Mouth Once A Day 4)  Dapsone 100 Mg  Tabs (Dapsone) .... Take 1 Tablet By Mouth Once A Day 5)  Glucophage 1000 Mg Tabs (Metformin Hcl) .... Take 1 Tablet By Mouth Two Times A Day 6)  Glucotrol 10 Mg Tabs (Glipizide) .... Take 1 Tablet By Mouth Once A Day 7)  Lisinopril 20 Mg Tabs (Lisinopril)  .... Take 1 Tablet By Mouth Once A Day 8)  Androgel 50 Mg/5gm Gel (Testosterone) .... Apply One Packet Q Day 9)  Norvir Tablets 100 Mg .... Take 1 By Mouth Daily. 10)  Podofilox 0.5 % Soln (Podofilox) .... Apply Twice A Day For 12 Hours Then Stop For 4 Days - May Repeat 3 Times 11)  Clotrimazole 1 % Crea (Clotrimazole) .... Apply Two Times A Day 12)  Viagra 50 Mg Tabs (Sildenafil Citrate) .... Use As Directed 13)  Lipitor 20 Mg .... Take One  Tablet By Mouth Daily.  Allergies: 1)  ! Sulfa   Review of Systems       The patient complains of weight gain.  The patient denies anorexia, fever, and chest pain.     Physical Exam  General:  alert, well-nourished, and overweight-appearing.   Head:  normocephalic and atraumatic.   Mouth:  pharynx pink and moist.   Lungs:  normal breath sounds.     Impression & Recommendations:  Problem # 1:  DIABETES MELLITUS, TYPE II (ICD-250.00) Assessment Deteriorated  Pt has been drinking sodas.  Urinating a lot.  BS 590.  Will give him 10 units of insulin and a liter of .9NS. Diet instructions given.  He will f/u with me  on Thursday. His updated medication list for this problem includes:    Glucophage 1000 Mg Tabs (Metformin hcl) .Marland Kitchen... Take 1 tablet by mouth two times a day    Glucotrol 10 Mg Tabs (Glipizide) .Marland Kitchen... Take 1 tablet by mouth once a day    Lisinopril 20 Mg Tabs (Lisinopril) .Marland Kitchen... Take 1 tablet by mouth once a day  Orders: Insulin 5 units [id number]) Admin of Therapeutic Inj  intramuscular or subcutaneous [id number])   Patient Instructions: 1)  Follow-up on Thursday 9/30   Laboratory Results   Blood Tests   Date/Time Received: 01/02/09  cbg at 430 pm 489  CBG Random:: 592mg /dL       Medication Administration  Injection # 1:    Medication: Insulin 5 units    Diagnosis: DIABETES MELLITUS, TYPE II (ICD-250.00)    Route: SQ    Site: L deltoid    Exp Date: 11/07/2010    Lot #: [id number]    Mfr: novo nordisk    Comments: Dose  of insulin 10 units regular insulin given.    Patient tolerated injection without complications    Given by: Barbra Sarks RN (January 02, 2009 6:03 PM)  Infusion # 1:    Diagnosis: DIABETES MELLITUS, TYPE II (ICD-250.00)    Started: 350pm    Stopped: 445pm    Solution: 0.9% NS 1054ml    Instructions: Infuse over 1 hour    Route: IV infusion    Site: R hand    Ordered by: Dr. Tomma Lightning    Administered by: Barbra Sarks RN-January 02, 2009 6:05 PM    Comments: IV started by Verne Spurr RN.    Patient tolerated infusion without complications  Orders Added: 1)  Insulin 5 units [J1815] 2)  Admin of Therapeutic Inj  intramuscular or subcutaneous [id number]

## 2010-05-08 NOTE — Progress Notes (Signed)
Summary: Lantus arrived for 3 mos supply next order due 01/08/10  Phone Note Refill Request      Prescriptions: LANTUS 100 UNIT/ML SOLN (INSULIN GLARGINE) 60 units one time a day  #10 vials x 0   Entered by:   Canary Brim  BS,CPht II,MPH   Authorized by:   Aldona Bar MD   Signed by:   Canary Brim  BS,CPht II,MPH on 11/15/2009   Method used:   Samples Given   RxID:   [id number]   Patient Assist Medication Verification: Medication: Lantus 100 units/ml [id number] Exp Date:03 2013 Tech approval:MLD I will let [name] know that all his medications are ready to be taken to him if he not able to come and pick up . Canary Brim  BS,CPht II,MPH  November 15, 2009 12:31 PM                  Appended Document: Lantus arrived for 3 mos supply next order due 01/08/10 Prescription/Samples picked up by: patient

## 2010-05-08 NOTE — Letter (Signed)
Summary: External Correspondence - [facility].  External Correspondence - [facility].   Imported By: Renato Battles 04/14/2009 16:39:12  _____________________________________________________________________  External Attachment:    Type:   Image     Comment:   External Document
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
## 2010-05-08 NOTE — Progress Notes (Signed)
Summary: pt. notified ADAP meds arrived  Phone Note Outgoing Call   Call placed by: Kennyth Lose Summary of Call: Called pt. to notify that ADAP meds have arrived for October, Dapsone, Norvir, Zidovudine, Truvada, and Reyataz Initial call taken by: Myrtis Hopping CMA Deborra Medina),  January 29, 2010 9:26 AM     Appended Document: pt. notified ADAP meds arrived pt. picked up ADAP meds

## 2010-05-10 NOTE — Assessment & Plan Note (Signed)
Summary: 37month f/u [mkj]   Primary Provider:  none   History of Present Illness: Presents to clinic for f/u. No recent staging labs.  Having problems with glucose control recently.  Had FBS drawn this morning with a f/u in IM clinic today.  Denies any missed doses of ARV's since last visit.  Requesting Rx for Viagra due to long-term problems with ED.   Current Allergies: ! SULFA Review of Systems  The patient denies anorexia, fever, weight loss, weight gain, vision loss, decreased hearing, hoarseness, chest pain, syncope, dyspnea on exertion, peripheral edema, prolonged cough, headaches, hemoptysis, abdominal pain, melena, hematochezia, severe indigestion/heartburn, hematuria, incontinence, genital sores, muscle weakness, suspicious skin lesions, transient blindness, difficulty walking, depression, unusual weight change, abnormal bleeding, enlarged lymph nodes, angioedema, breast masses, and testicular masses.         Erectile dysfunction as per HPI, also having polydipsia and polyuria episodes  Physical Exam  General:  alert, well-developed, well-hydrated, cooperative to examination, good hygiene, and overweight-appearing.   Head:  normocephalic and atraumatic.   Eyes:  No corneal or conjunctival inflammation noted. EOMI. Perrla. Vision grossly normal. Ears:  R ear normal and L ear normal.   Mouth:  pharynx pink and moist, no erythema, no exudates, no postnasal drip, no lesions, and fair dentition.   Neck:  No deformities, masses, or tenderness noted. Chest Wall:  No deformities, masses, tenderness or gynecomastia noted. Lungs:  Normal respiratory effort, chest expands symmetrically. Lungs are clear to auscultation, no crackles or wheezes. Heart:  Normal rate and regular rhythm. S1 and S2 normal without gallop, murmur, click, rub or other extra sounds. Abdomen:  Obese, soft, non-tender, normal bowel sounds, no masses, no hepatomegaly, and no splenomegaly.   Msk:  L knee joint  tenderness and joint swelling.   Pulses:  R and L carotid,radial,femoral,dorsalis pedis and posterior tibial pulses are full and equal bilaterally Extremities:  No clubbing, cyanosis, edema, or deformity noted with normal full range of motion of all joints.   Neurologic:  No cranial nerve deficits noted. Station and gait are normal. Sensory, motor and coordinative functions appear intact. Skin:  Intact without suspicious lesions or rashes Cervical Nodes:  Shoddy A&P LAD Psych:  Cognition and judgment appear intact. Alert and cooperative with normal attention span and concentration. No apparent delusions, illusions, hallucinations   Impression & Recommendations:  Problem # 1:  HIV DISEASE (ICD-042) Claims adherence to regimen.  Last CD4 in 12/2009 was 160@9 % with HIV RNA of 696 copies.  Only 1% injcrease in CD4%, but >1 log drop from labs oibtained in 10/2009.  Still given his hx, it is not clear whether this response was sustained.  Needs staging labs today and would suggest a follow-up visit in 2 weeks to review lab results.  Until then, he should continue his current regimen along with Dapsone for PCP prophylaxis. Orders: Est. Patient Level IV [id number]) T-CBC w/Diff [phone]) T-CD4SP ([facility]) (CD4SP) T-Comprehensive Metabolic Panel (A999333) T-HIV1 Quant rflx Ultra or Genotype [id number])  Problem # 2:  DIABETES MELLITUS, TYPE II (ICD-250.00) To follow-up with IM today.  Emphasized importance of HIV therapy in other health issues including his diabetes which subjectively appears to have control issues.  Verbally acknowledged this. His updated medication list for this problem includes:    Glucophage 1000 Mg Tabs (Metformin hcl) .Marland Kitchen... Take 1 tablet by mouth two times a day    Lisinopril 40 Mg Tabs (Lisinopril) .Marland Kitchen... Take 1 tablet by mouth once a day  Lantus 100 Unit/ml Soln (Insulin glargine) .Marland Kitchen... 90 units once daily  Problem # 3:  ERECTILE DYSFUNCTION (ICD-302.72) From a HIV  perspective he can use Viagra, but at lowest dose possible due to him being on PI therapy.  However, given multiple co-morbidities, it is recommended that IM decide whether Viagra therapy is clinically appropriate for him.  We instructed him of this matter and he agreed to address this matter with IM.
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
## 2010-05-10 NOTE — Miscellaneous (Signed)
Summary: Bridge Counselor  Clinical Lists Changes BC spoke with pt today and he has received his medicaid card! He will bring it with him to his scheduled appointment with Dr. Tomma Lightning tomorrow. [facility] closed pt's file today after discussing his options for ongoing case management. Pt will "think about" what he wants to do and has the resources to connect with the agency of his choice once he decides. He is considering assisted living and will discuss with [facility] tomorrow.

## 2010-05-10 NOTE — Progress Notes (Signed)
Summary: Medication  Phone Note Refill Request Message from:  Patient on April 12, 2010 11:54 AM  Refills Requested: Medication #1:  TRAMADOL HCL 50 MG TABS Take 1-2 tablets by mouth every 6 hours as needed for pain. Call from pt said that his pain medication was not working and would like to get the medication that he received in the hospital.  RTC to pt message left for the pt to call the Clinics and to ask for a Triage nurse.  Initial call taken by: Sander Nephew RN,  April 12, 2010 11:54 AM    Instructed to take Tramadol until seen by Pain clinic.

## 2010-05-10 NOTE — Miscellaneous (Signed)
Summary: Orders Update     Process Orders Check Orders Results:     [facility]: [id number] not required for this insurance Order queued for requisitioning for [facility]: April 10, 2010 10:26 AM Tests Sent for requisitioning (April 10, 2010 11:40 AM):     04/10/2010: [facility] -- T-Drug Screen-Urine, (single) [id number] (signed)

## 2010-05-10 NOTE — Assessment & Plan Note (Signed)
Summary: DM TEACHING/CH   Vital Signs:  Patient profile:   45 year old male Weight:      280.2 pounds BMI:     41.53 Is Patient Diabetic? Yes Did you bring your meter with you today? No CBG Result 548 CBG Device ID his new meter accuchek aviva Comments patient walked 10 laps and drinak water and retested blood sugar prior to leaving with result of 507- patient reports he will walk home and continue to dinrk water today and recheck blood sugar to be sure is continues to drop   Allergies: 1)  ! Sulfa  Diabetes Management Exam:    Foot Exam (with socks and/or shoes not present):       Sensory-Monofilament:          Left foot: abnormal          Right foot: abnormal   Complete Medication List: 1)  Truvada 200-300 Mg Tabs (Emtricitabine-tenofovir) .... Take 1 tablet by mouth once a day 2)  Reyataz 300 Mg Caps (Atazanavir sulfate) .... Take 1 tablet by mouth once a day 3)  Dapsone 100 Mg Tabs (Dapsone) .... Take 1 tablet by mouth once a day 4)  Glucophage 1000 Mg Tabs (Metformin hcl) .... Take 1 tablet by mouth two times a day 5)  Androgel 50 Mg/5gm Gel (Testosterone) .... Apply one packet q day 6)  Norvir 100 Mg Tabs (Ritonavir) .... Take 1 tablet by mouth once a day 7)  Aspirin 81 Mg  .... Take one tablet by mouth daily. 8)  Lisinopril 40 Mg Tabs (Lisinopril) .... Take 1 tablet by mouth once a day 9)  Zidovudine 300 Mg Tabs (Zidovudine) .... One tablet by mouth two times a day 10)  Atenolol 50 Mg Tabs (Atenolol) .... Take 1 tablet by mouth once a day 11)  Lantus 100 Unit/ml Soln (Insulin glargine) .... 60 units one time a day 12)  Accu-chek Aviva Plus Strp (Glucose blood) .... Use to check lbood sugar twice a day- moring and evening before eating- when you give the lantus insulin 13)  Accu-chek Multiclix Lancets Misc (Lancets) .... Use to check blood sugar up to 2 times daily  Other Orders: DSMT (Medicaid) 60 Minutes (99404) T-Hgb A1C (in-house) [id number])   Orders Added: 1)   DSMT (Medicaid) 60 Minutes [id number] 2)  T-Hgb A1C (in-house) [id number]     Diabetic Foot Exam Foot Inspection Is there a history of a foot ulcer?              No Is there a foot ulcer now?              No Can the patient see the bottom of their feet?          Yes Are the shoes appropriate in style and fit?          No Is there swelling or an abnormal foot shape?          No Are the toenails long?                Yes Are the toenails thick?                Yes Are the toenails ingrown?              No Is there heavy callous build-up?              Yes Is there pain in the calf muscle (Intermittent claudication) when walking?    NoIs  there a claw toe deformity?              No Is there elevated skin temperature?            No Is there limited ankle dorsiflexion?            No Is there foot or ankle muscle weakness?            No  Diabetic Foot Care Education Patient educated on appropriate care of diabetic feet.  Comments: Both feet VERY dry and callused. left foot has cracked callus on metatarsal head. and fungus in big toenail and second toenail- which is too long due to being too thick to cut. Right foot with heavy callus on bottom and metatarsal head.  High Risk Feet? Yes   10-g (5.07) Semmes-Weinstein Monofilament Test Performed by: Barnabas Harries RD          Right Foot          Left Foot Visual Inspection     abnormal         abnormal Site 1         normal         normal Site 2         abnormal         normal Site 4         normal         normal Site 5         normal         abnormal Site 6         normal         abnormal  Impression      abnormal         abnormal    Diabetes Self Management Training  Date diagnosed with diabetes: 05/09/2006 Diabetes Type: Type 2 insulin Other persons present: no Current smoking Status: never  Vital Signs Todays Weight: 280.2lb  in BMI 41.53in-lbs   Potential Barriers  Transportation  Economic/Supplies  Support  Cognitive  Mental  Illness  Low Literacy  Coping Skills  Diabetes Medications:  Comments: patient says he has taken insulin every day since discharge on Monday except on monday because the doctor told he did not have to. CBg self tested today on new meter- Accuchk aviva- 548 then had patient walk and drink 24 oz water & recheck was 507. patient feels fine other than urinating often. reports gopod appetite and sugar free drinks and water. Recommend he be transitioned to one injection of split mixed insulin each day in morning. Can have him be observed to be sure he is giving his insulin- he demonstrated good technique and injected himself with 40 units saline today. Patient reports he takes 7 pills a day in addition to insulin.   Long Acting  Insulin Type:Lantus Breakfast Dose: 40 units with compliance reported by patient  Bedtime Dose: 40 units with compliance reported by patient    Monitoring Self monitoring blood glucose 2 times a day Name of Meter  his new meter accuchek aviva  Recent Episodes of: Requiring Help from another person  Hyperglycemia : Yes HHNK: Yes  Other Assessment Had an amputation due to diabetes? No Ever had a foot ulcer or infection?           No Performs daily self-foot exams: Yes    Last Physician foot exam:  04/05/2010  Estimated /Usual Carb Intake Breakfast # of Carbs/Grams oatmeal or 12 inch sub from [employer]  Nutrition assessment ETOH : No What beverages do you drink?  drinks a 6 pack of diet soda a day Do you read food labels?                                                                          he demonstrated ability to read- reports vision is blurry due to high blood sugar Diabetes Disease Process  Discussed today State diabetes is treated by meal plan-exercise-medication-monitoring-education: Needs review/assistance Medications  Nutritional Management  Monitoring State purpose and frequency of monitoring BG-ketones-HgbA1C  : Needs review/assistance     State target blood glucose and HgbA1C goals: Needs review/assistance    Complications State the causes-signs and symptoms and prevention of Hyperglycemia: Needs review/assistance   Explain proper treatment of hyperglycemia: Needs review/assistance   State the principles of skin-dental and foot care: Needs review/assistanceDescribe symptoms of skin and foot problems and describe foot exam: Demonstrates competency   State when to seek medical advice and treatment: Needs review/assistance    Exercise States importance of exercise: Needs review/assistance   States effect of exercise on blood glucose: Demonstrates competency    Lifestyle changes:Goal setting and Problem solving State benefits of making appropriate lifestyle changes: Needs review/assistance   Identify lifestyle behaviors that need to change: Needs review/assistance   Identify risk factors that interfere with health: Needs review/assistance   Develop strategies to reduce risk factors: Needs review/assistance   Verbalize need for and frequency of health care follow-up: Needs review/assistance    Psychosocial Adjustment Identify two methods to cope with these feelings: Coping SkillsDiabetes Management Education Done: 04/05/2010    BEHAVIORAL GOALS INITIAL Utilizing medications if for therapeutic effectiveness: take insulin as directed. bring with you to clinic        Because blood sugar si so high and patient does not have a primary care physician- asked him to reurn on Monday with meidicnes and meter to see CDE and office manager about being a patient here.   Patient reports he is tkaing the lantus twice daily as directed. Has attained CBGs ranging from 168-335 with most in 200s on 60-70 units lantus with  ~ 15 units rapid acting insulin( 5-8 with meals)  during a past hospital stay   Diabetes Self Management Support: home health and Bridge counseling case manager, ID office Follow-up weekly until diabetes, HIV and self  managment under control

## 2010-05-10 NOTE — Progress Notes (Signed)
Summary: diabetes support/dmr  Phone Note Other Incoming   Caller: Id clinic Summary of Call: Discussed  patient high blood sugars yesterday with Brad at [facility]. patient was instructed to get insulin from [facility] and he agreed to do so.  Initial call taken by: Barnabas Harries RD,CDE,  May 01, 2010 9:28 AM  Follow-up for Phone Call        Per [facility]: rxs are ready for pick up. Left patient a message re: picking up his insulin. Requested a return call Follow-up by: Barnabas Harries RD,CDE,  May 01, 2010 9:29 AM

## 2010-05-10 NOTE — Assessment & Plan Note (Signed)
Summary: DM TEACHING/CH   Vital Signs:  Patient profile:   45 year old male Weight:      275.3 pounds BMI:     40.80 CBG Result 520 CBG Device ID Accucheck Aviva Comments ate only yogurt this am- feels bad- no inulin since Friday   Allergies: 1)  ! Sulfa   Complete Medication List: 1)  Truvada 200-300 Mg Tabs (Emtricitabine-tenofovir) .... Take 1 tablet by mouth once a day 2)  Reyataz 300 Mg Caps (Atazanavir sulfate) .... Take 1 tablet by mouth once a day 3)  Dapsone 100 Mg Tabs (Dapsone) .... Take 1 tablet by mouth once a day 4)  Glucophage 1000 Mg Tabs (Metformin hcl) .... Take 1 tablet by mouth two times a day 5)  Androgel 50 Mg/5gm Gel (Testosterone) .... Apply one packet q day 6)  Norvir 100 Mg Tabs (Ritonavir) .... Take 1 tablet by mouth once a day 7)  Aspirin 81 Mg  .... Take one tablet by mouth daily. 8)  Lisinopril 40 Mg Tabs (Lisinopril) .... Take 1 tablet by mouth once a day 9)  Zidovudine 300 Mg Tabs (Zidovudine) .... One tablet by mouth two times a day 10)  Atenolol 50 Mg Tabs (Atenolol) .... Take 1 tablet by mouth once a day 11)  Lantus 100 Unit/ml Soln (Insulin glargine) .... 90 units once daily 12)  Accu-chek Aviva Plus Strp (Glucose blood) .... Use to check lbood sugar twice a day- moring and evening before eating- when you give the lantus insulin 13)  Accu-chek Multiclix Lancets Misc (Lancets) .... Use to check blood sugar up to 2 times daily 14)  Tramadol Hcl 50 Mg Tabs (Tramadol hcl) .... Take 1-2 tablets by mouth every 6 hours as needed for pain  Other Orders: DSMT (Medicaid) 57 Minutes [phone])   Orders Added: 1)  DSMT (Medicaid) 24 Minutes [id number]    Diabetes Self Management Training  PCP: Rudie Meyer MD Date diagnosed with diabetes: 05/09/2006 Diabetes Type: Type 2 insulin Other persons present: no Current smoking Status: never  Vital Signs Todays Weight: 275.3lb  in BMI 40.80in-lbs   Diabetes Medications:  Comments: last took  insulin 3 days ago. says he felt better when he was tkaing his insulin. Discussed high blood sugar wiht attending, Dr. Kelton Pillar to send prescriptions for lantus pen and needles to [facility] [address]. Patient deminstarted understanding of how to use insulin pen and that he will have to take 2 injections one of 40 units and the other of 50 units.   Long Acting  Insulin Type:Lantus Breakfast Dose: 90 units    Monitoring Self monitoring blood glucose 2 times a day Name of Meter  Accucheck Aviva  Recent Episodes of: Requiring Help from another person  Hyperglycemia : Yes     Diabetes Disease Process  Discussed today  Medications State name-action-dose-duration-side effects-and time to take medication: Demonstrates competency   State appropriate timing of food related to medication: Demonstrates competency   Demonstrates/verbalizes site selection and rotation for injections Demonstrates competency   Correctly draw up and administer insulin-Byetta-Symlin-glucagon: Demonstrates competency    Nutritional Management  Monitoring  Complications State the causes-signs and symptoms and prevention of Hyperglycemia: Demonstrates competency   Explain proper treatment of hyperglycemia: Demonstrates competency    Exercise Diabetes Management Education Done: 04/30/2010    BEHAVIORAL GOAL FOLLOW UP Utilizing medications if for therapeutic effectiveness: 50%of the time Specific goal set today: patient continue to work on goal of taking insulin daily      Diabetes  Self Management Support: home health, THP,  Bridge counseling case manager, ID office, CDE Follow-up:frequently with support persons

## 2010-05-10 NOTE — Assessment & Plan Note (Signed)
Summary: DM TEACHING/CH   Vital Signs:  Patient profile:   45 year old male Weight:      278.6 pounds BMI:     41.29 Patient with chronically high blood sugars as evidenced by A1C > 14.0 today along with blood sugar of 586.He is not symptomatic other than frequent urination.  He reports taking 80 units lantus daily since hospital discharge.  ID office is closed today. Will route note to [facility] counselor as well as Dr. Tomma Lightning.   Is Patient Diabetic? Yes Did you bring your meter with you today? No CBG Result 586 CBG Device ID [employer] office meter Comments Patient reports he has taken 40 units Lantus this morning and eaten large bowl oatmeal with splenda. had patient walk 15 laps around clinic and drink water and recheck is 585.    Allergies: 1)  ! Sulfa   Complete Medication List: 1)  Truvada 200-300 Mg Tabs (Emtricitabine-tenofovir) .... Take 1 tablet by mouth once a day 2)  Reyataz 300 Mg Caps (Atazanavir sulfate) .... Take 1 tablet by mouth once a day 3)  Dapsone 100 Mg Tabs (Dapsone) .... Take 1 tablet by mouth once a day 4)  Glucophage 1000 Mg Tabs (Metformin hcl) .... Take 1 tablet by mouth two times a day 5)  Androgel 50 Mg/5gm Gel (Testosterone) .... Apply one packet q day 6)  Norvir 100 Mg Tabs (Ritonavir) .... Take 1 tablet by mouth once a day 7)  Aspirin 81 Mg  .... Take one tablet by mouth daily. 8)  Lisinopril 40 Mg Tabs (Lisinopril) .... Take 1 tablet by mouth once a day 9)  Zidovudine 300 Mg Tabs (Zidovudine) .... One tablet by mouth two times a day 10)  Atenolol 50 Mg Tabs (Atenolol) .... Take 1 tablet by mouth once a day 11)  Lantus 100 Unit/ml Soln (Insulin glargine) .... 60 units one time a day     Laboratory Results   Blood Tests   Date/Time Received: April 09, 2010 10:06 AM Date/Time Reported: Maryan Rued  April 09, 2010 10:06 AM    HGBA1C: >14.0%   (Normal Range: Non-Diabetic - 3-6%   Control Diabetic - 6-8%) CBG Random:: 586mg /dL      Diabetes Self Management Training  Date diagnosed with diabetes: 05/09/2006 Diabetes Type: Type 2 insulin Other persons present: no Current smoking Status: never  Vital Signs Todays Weight: 278.6lb  in BMI 41.29in-lbs      Assessment Work Hours: Not currently working Sources of Support: mother lives on other side of town and calls him daily to see how his blood sugar is - Patient states his brother also lives in town.  Years of school completed: Some High school Special needs or Barriers: patient says he did not graduates and quit school because mother was sick  Potential Barriers  Transportation  Economic/Supplies  Substance Abuse  Coping Skills  Diabetes Medications:  Comments: brought meds but not meter or insulin: date filed on bottles was 12/27/09- patient says eh gets meds every 3 month, but he still should have been out of meds by 03/28/10 and still had at least 19 pills in each bottle. Patient without explanation excpet to say he is tkaing his mediicne as prescribed. He might be adherent now, but maybe didn't take them as directed at some point in the past 3-4 month to explain? He has very good math skills- counted pils faster than I could laying on table. could also read pill bottles. asking for somehting  for his knee pain- Discussed case with attending and nurses and front office manager- patient was scheduled to see a doctor tomorrow morning and he is to bring his meter, his insulin and all his other medicines.     Monitoring Self monitoring blood glucose 2 times a day Name of Meter  [employer] office meter  Other Assessment Performs daily self-foot exams: Yes     Diabetes Disease Process  Discussed today State diabetes is treated by meal plan-exercise-medication-monitoring-education: Needs review/assistance Medications State appropriate timing of food related to medication: Demonstrates competencyDemonstrates/verbalizes site selection and  rotation for injections Demonstrates competencyCorrectly draw up and administer insulin-Byetta-Symlin-glucagon: Demonstrates competency Nutritional Management  Monitoring State purpose and frequency of monitoring BG-ketones-HgbA1C  : Needs review/assistance   State target blood glucose and HgbA1C goals: Needs review/assistance    Complications State the causes-signs and symptoms and prevention of Hyperglycemia: Demonstrates competency   Explain proper treatment of hyperglycemia: Demonstrates competency   State the principles of skin-dental and foot care: Needs review/assistance   Describe symptoms of skin and foot problems and describe foot exam: Demonstrates competency   State when to seek medical advice and treatment: Demonstrates competency    Exercise States importance of exercise: Demonstrates competency   States effect of exercise on blood glucose: Demonstrates competency    Lifestyle changes:Goal setting and Problem solving Verbalize need for and frequency of health care follow-up: Demonstrates competencyList at least two appropriate community resources: Needs review/assistanceIdentify Family/SO role in managing diabetes: Needs review/assistance Psychosocial Adjustment Identify two methods to cope with these feelings: Coping SkillsDiabetes Management Education Done: 04/09/2010    BEHAVIORAL GOAL FOLLOW UP Utilizing medications if for therapeutic effectiveness: 50%of the time Monitoring blood glucose levels daily: 50%of the time      Took shoes off to shoe me how much better his feet looked and that he was wearing better shoes.  Diabetes Self Management Support: home health, THP,  Bridge counseling case manager, ID office, CDE Follow-up:frequently with support persons

## 2010-05-10 NOTE — Letter (Signed)
Summary: ACCU-CHEK  ACCU-CHEK   Imported By: Garlan Fillers 04/13/2010 14:40:57  _____________________________________________________________________  External Attachment:    Type:   Image     Comment:   External Document

## 2010-05-10 NOTE — Assessment & Plan Note (Addendum)
Summary: 33month f/u [mkj]   CC:  follow-up visit, lab results, wants nurse to fill pill boxes, req. Rx for Viagra, and has opthalmologist appt. next week.  Preventive Screening-Counseling & Management  Alcohol-Tobacco     Alcohol drinks/day: 0     Smoking Status: never  Caffeine-Diet-Exercise     Caffeine use/day: occasional coffee     Does Patient Exercise: yes     Type of exercise: walks     Exercise (avg: min/session): >60     Times/week: <3  Hep-HIV-STD-Contraception     HIV Risk: no     Sun Exposure-Excessive: occasionally  Safety-Violence-Falls     Seat Belt Use: 100      Drug Use:  no.    Comments: pt. declined condoms   Current Allergies (reviewed today): ! SULFA Vital Signs:  Patient profile:   45 year old male Height:      69 inches (175.26 cm) Weight:      277.4 pounds (126.09 kg) BMI:     41.11 Temp:     97.5 degrees F (36.39 degrees C) oral Pulse rate:   78 / minute BP sitting:   126 / 89  (left arm)  Vitals Entered By: Myrtis Hopping CMA ( [place]) (April 27, 2010 10:30 AM) CC: follow-up visit, lab results, wants nurse to fill pill boxes, req. Rx for Viagra, has opthalmologist appt. next week Is Patient Diabetic? Yes Did you bring your meter with you today? No Pain Assessment Patient in pain? no      Nutritional Status BMI of > 30 = obese Nutritional Status Detail apetite is "alright"  Have you ever been in a relationship where you felt threatened, hurt or afraid?No   Does patient need assistance? Functional Status Self care Ambulation Normal    Other Orders: T-Hgb A1C (in-house) [phone])  Prevention & Chronic Care Immunizations   Influenza vaccine: Fluvax 3+  (04/18/2010)    Tetanus booster: Not documented    Pneumococcal vaccine: Pneumovax  (09/08/2006)  Other Screening   PSA: Not documented   Smoking status: never  (04/27/2010)  Diabetes Mellitus   HgbA1C: >14.0  (04/09/2010)   HgbA1C action/deferral: Ordered   (04/05/2010)    Eye exam: Not documented   Diabetic eye exam action/deferral: Ophthalmology referral  (04/10/2010)    Foot exam: yes  (04/05/2010)   Foot exam action/deferral: Do today   High risk foot: Yes  (04/05/2010)   Foot care education: Done  (04/05/2010)    Urine microalbumin/creatinine ratio: 75.9  (04/10/2010)   Urine microalbumin action/deferral: Ordered  Lipids   Total Cholesterol: 239  (12/16/2009)   LDL: 159  (12/08/2008)   LDL Direct: Not documented   HDL: 47  (12/16/2009)   Triglycerides: 531  (12/16/2009)  Hypertension   Last Blood Pressure: 126 / 89  (04/27/2010)   Serum creatinine: 1.15  (04/18/2010)   Serum potassium 4.9  (04/18/2010)  Self-Management Support :   Personal Goals (by the next clinic visit) :     Personal A1C goal: 9  (04/10/2010)     Personal blood pressure goal: 130/80  (04/10/2010)     Personal LDL goal: 100  (04/10/2010)    Diabetes self-management support: Written self-care plan  (04/18/2010)   Last diabetes self-management training by diabetes educator: 04/09/2010    Hypertension self-management support: Written self-care plan  (04/18/2010)

## 2010-05-10 NOTE — Assessment & Plan Note (Signed)
Summary: diabetic/refrred from ID/DS   Vital Signs:  Patient profile:   45 year old male Height:      69 inches (175.26 cm) Weight:      278.04 pounds (126.38 kg) BMI:     41.21 O2 Sat:      100 % on Room air Temp:     97.1 degrees F (36.17 degrees C) oral Pulse rate:   87 / minute BP sitting:   130 / 83  (right arm) Cuff size:   large  Vitals Entered By: Sander Nephew RN (April 10, 2010 8:54 AM)  O2 Flow:  Room air Is Patient Diabetic? Yes Did you bring your meter with you today? Yes Pain Assessment Patient in pain? yes     Location: knee Intensity: 9 Type: aching Onset of pain  Constant Nutritional Status BMI of > 30 = obese CBG Result 379  Have you ever been in a relationship where you felt threatened, hurt or afraid?No   Does patient need assistance? Functional Status Self care Ambulation Normal Comments Pain in his right knee.  Check up establish care.  Wants something for the pain.   History of Present Illness: 45 yr old man with pmhx as described below comes to the clinic for diabetes management. Patient was previously taking Lantus 60 units subcutaneously at bedtime. Now patient is using Lantus 40 units subcutaneously two times a day. Meter reports that CBGs are >500.    Right knee pain 2/2 to trauma since 1982. Worse with cold temperatures and exercise. Patient has had imaging that showed some degenerative changes.    Depression History:      The patient denies a depressed mood most of the day and a diminished interest in his usual daily activities.         Preventive Screening-Counseling & Management  Alcohol-Tobacco     Alcohol drinks/day: 0     Smoking Status: never  Problems Prior to Update: 1)  Genital Lesion  () 2)  Inf Due Oth Gm-negative Organisms Cce & Uns Site  (ICD-041.85) 3)  Strep Inf Cce & Uns Site Group D [enterococcus]  (ICD-041.04) 4)  Unspecified Cellulitis and Abscess of Finger  (ICD-681.00) 5)  Osteomyelitis, Hand   (ICD-730.24) 6)  Paronychia, Finger  (ICD-681.02) 7)  Dermatophytosis of Unspecified Site  (ICD-110.9) 8)  Erectile Dysfunction  (ICD-302.72) 9)  Venereal Wart  (ICD-078.11) 10)  Hypertension  (ICD-401.9) 11)  Diabetes Mellitus, Type II  (ICD-250.00) 12)  HIV Disease  (ICD-042)  Medications Prior to Update: 1)  Truvada 200-300 Mg Tabs (Emtricitabine-Tenofovir) .... Take 1 Tablet By Mouth Once A Day 2)  Reyataz 300 Mg  Caps (Atazanavir Sulfate) .... Take 1 Tablet By Mouth Once A Day 3)  Dapsone 100 Mg  Tabs (Dapsone) .... Take 1 Tablet By Mouth Once A Day 4)  Glucophage 1000 Mg Tabs (Metformin Hcl) .... Take 1 Tablet By Mouth Two Times A Day 5)  Androgel 50 Mg/5gm Gel (Testosterone) .... Apply One Packet Q Day 6)  Norvir 100 Mg Tabs (Ritonavir) .... Take 1 Tablet By Mouth Once A Day 7)  Aspirin 81 Mg .... Take One Tablet By Mouth Daily. 8)  Lisinopril 40 Mg Tabs (Lisinopril) .... Take 1 Tablet By Mouth Once A Day 9)  Zidovudine 300 Mg Tabs (Zidovudine) .... One Tablet By Mouth Two Times A Day 10)  Atenolol 50 Mg Tabs (Atenolol) .... Take 1 Tablet By Mouth Once A Day 11)  Lantus 100 Unit/ml Soln (Insulin Glargine) .Marland KitchenMarland KitchenMarland Kitchen  60 Units One Time A Day 12)  Accu-Chek Aviva Plus  Strp (Glucose Blood) .... Use To Check Lbood Sugar Twice A Day- Moring and Evening Before Eating- When You Give The Lantus Insulin 13)  Accu-Chek Multiclix Lancets  Misc (Lancets) .... Use To Check Blood Sugar Up To 2 Times Daily  Current Medications (verified): 1)  Truvada 200-300 Mg Tabs (Emtricitabine-Tenofovir) .... Take 1 Tablet By Mouth Once A Day 2)  Reyataz 300 Mg  Caps (Atazanavir Sulfate) .... Take 1 Tablet By Mouth Once A Day 3)  Dapsone 100 Mg  Tabs (Dapsone) .... Take 1 Tablet By Mouth Once A Day 4)  Glucophage 1000 Mg Tabs (Metformin Hcl) .... Take 1 Tablet By Mouth Two Times A Day 5)  Androgel 50 Mg/5gm Gel (Testosterone) .... Apply One Packet Q Day 6)  Norvir 100 Mg Tabs (Ritonavir) .... Take 1 Tablet By  Mouth Once A Day 7)  Aspirin 81 Mg .... Take One Tablet By Mouth Daily. 8)  Lisinopril 40 Mg Tabs (Lisinopril) .... Take 1 Tablet By Mouth Once A Day 9)  Zidovudine 300 Mg Tabs (Zidovudine) .... One Tablet By Mouth Two Times A Day 10)  Atenolol 50 Mg Tabs (Atenolol) .... Take 1 Tablet By Mouth Once A Day 11)  Lantus 100 Unit/ml Soln (Insulin Glargine) .... 40 Units Two Times A Day 12)  Accu-Chek Aviva Plus  Strp (Glucose Blood) .... Use To Check Lbood Sugar Twice A Day- Moring and Evening Before Eating- When You Give The Lantus Insulin 13)  Accu-Chek Multiclix Lancets  Misc (Lancets) .... Use To Check Blood Sugar Up To 2 Times Daily  Allergies: 1)  ! Sulfa  Past History:  Past Medical History: Last updated: 05/19/2008 HIV disease Diabetes mellitus, type II Hypertension  Risk Factors: Alcohol Use: 0 (04/10/2010) Caffeine Use: occasional coffee (12/21/2009) Exercise: yes (12/21/2009)  Risk Factors: Smoking Status: never (04/10/2010)  Review of Systems  The patient denies fever, chest pain, syncope, dyspnea on exertion, peripheral edema, prolonged cough, headaches, hemoptysis, abdominal pain, melena, hematochezia, severe indigestion/heartburn, hematuria, muscle weakness, difficulty walking, and unusual weight change.    Physical Exam  General:  NAD, vitals reviewed Eyes:  vision grossly intact.   Ears:  no external deformities.   Nose:  no external deformity.   Mouth:  dry mucous membranes Neck:  supple and full ROM.   Lungs:  Normal respiratory effort, chest expands symmetrically. Lungs are clear to auscultation, no crackles or wheezes. Heart:  Normal rate and regular rhythm. S1 and S2 normal without gallop, murmur, click, rub or other extra sounds. Abdomen:  soft, non-tender, and normal bowel sounds.   Msk:  ttp right knee, limited range of motion due to pain no joint swelling, no joint warmth, no redness over joints, no joint deformities, and no joint instability.     Extremities:  no edema Neurologic:  alert & oriented X3, cranial nerves II-XII intact, strength normal in all extremities, and sensation intact to light touch.     Impression & Recommendations:  Problem # 1:  DIABETES MELLITUS, TYPE II (ICD-250.00) Uncontrolled. Increased Lantus dose. Reviewed meter readings which were >500. Instructed to check blood sugar levels at least twice a day for the next two weeks. On follow up will review meter readings and consider increasing lantus and/or starting patient on SSI. Check urine micro/albumin, Opthalmology referral for diabetic eye exam. Patient already on ace.  His updated medication list for this problem includes:    Glucophage 1000 Mg Tabs (Metformin  hcl) ..... Take 1 tablet by mouth two times a day    Lisinopril 40 Mg Tabs (Lisinopril) .Marland Kitchen... Take 1 tablet by mouth once a day    Lantus 100 Unit/ml Soln (Insulin glargine) .Marland KitchenMarland KitchenMarland KitchenMarland Kitchen 45 units two times a day  Orders: Capillary Blood Glucose/CBG [id number]) T-Urine Microalbumin w/creat. ratio [phone]) Ophthalmology Referral (Ophthalmology)  Labs Reviewed: Creat: 1.06 (12/21/2009)    Reviewed HgBA1c results: >14.0 (04/09/2010)  18.9 (12/16/2009)  Problem # 2:  KNEE PAIN, RIGHT, CHRONIC (ICD-719.46) Reviewed images. Degenerative changes noted. Will referr to Pain clinic for further management. Gave tramadol until seen by Pain clinic. Check UDS.  His updated medication list for this problem includes:    Tramadol Hcl 50 Mg Tabs (Tramadol hcl) .Marland Kitchen... Take 1-2 tablets by mouth every 6 hours as needed for pain  Orders: Pain Clinic Referral (Pain) T-Urine Drug Screen (in house) [id number])  Problem # 3:  HYPERTENSION (ICD-401.9) At goal. Continue current regimen.  His updated medication list for this problem includes:    Lisinopril 40 Mg Tabs (Lisinopril) .Marland Kitchen... Take 1 tablet by mouth once a day    Atenolol 50 Mg Tabs (Atenolol) .Marland Kitchen... Take 1 tablet by mouth once a day  BP today: 130/83 Prior  BP: 129/84 (12/21/2009)  Labs Reviewed: K+: 4.0 (12/21/2009) Creat: : 1.06 (12/21/2009)   Chol: 239 (12/16/2009)   HDL: 47 (12/16/2009)   LDL: 159 (12/08/2008)   TG: 531 (12/16/2009)  Complete Medication List: 1)  Truvada 200-300 Mg Tabs (Emtricitabine-tenofovir) .... Take 1 tablet by mouth once a day 2)  Reyataz 300 Mg Caps (Atazanavir sulfate) .... Take 1 tablet by mouth once a day 3)  Dapsone 100 Mg Tabs (Dapsone) .... Take 1 tablet by mouth once a day 4)  Glucophage 1000 Mg Tabs (Metformin hcl) .... Take 1 tablet by mouth two times a day 5)  Androgel 50 Mg/5gm Gel (Testosterone) .... Apply one packet q day 6)  Norvir 100 Mg Tabs (Ritonavir) .... Take 1 tablet by mouth once a day 7)  Aspirin 81 Mg  .... Take one tablet by mouth daily. 8)  Lisinopril 40 Mg Tabs (Lisinopril) .... Take 1 tablet by mouth once a day 9)  Zidovudine 300 Mg Tabs (Zidovudine) .... One tablet by mouth two times a day 10)  Atenolol 50 Mg Tabs (Atenolol) .... Take 1 tablet by mouth once a day 11)  Lantus 100 Unit/ml Soln (Insulin glargine) .... 45 units two times a day 12)  Accu-chek Aviva Plus Strp (Glucose blood) .... Use to check lbood sugar twice a day- moring and evening before eating- when you give the lantus insulin 13)  Accu-chek Multiclix Lancets Misc (Lancets) .... Use to check blood sugar up to 2 times daily 14)  Tramadol Hcl 50 Mg Tabs (Tramadol hcl) .... Take 1-2 tablets by mouth every 6 hours as needed for pain  Patient Instructions: 1)  Please schedule a follow-up appointment in 2 weeks for diabetes management. 2)  Take blood sugars at least twice a day for the next two weeks.  3)  Remember to increase Lantus to 45 units twice a day. 4)  Take tramadol for pain until seen by Pain clinic. 5)  You will be called with any abnormalities in the tests scheduled or performed today.  If you don't hear from [place] within a week from when the test was performed, you can assume that your test was normal.  6)  Take  all medication as directed. Prescriptions: TRAMADOL HCL 50 MG TABS (TRAMADOL  HCL) Take 1-2 tablets by mouth every 6 hours as needed for pain  #60 x 1   Entered and Authorized by:   Rudie Meyer MD   Signed by:   Rudie Meyer MD on 04/10/2010   Method used:   Print then Give to Patient   RxID:   [id number] TRAMADOL HCL 50 MG TABS (TRAMADOL HCL) Take 1 tablet by mouth every 6 hours as needed for pain  #60 x 1   Entered and Authorized by:   Rudie Meyer MD   Signed by:   Rudie Meyer MD on 04/10/2010   Method used:   Print then Give to Patient   RxID:   [id number]    Orders Added: 1)  Capillary Blood Glucose/CBG [id number] 2)  Pain Clinic Referral [Pain] 3)  T-Urine Microalbumin w/creat. ratio [[id number]] 4)  Ophthalmology Referral [Ophthalmology] 5)  T-Urine Drug Screen (in house) [id number])  Est. Patient Level III [id number]     Vital Signs:  Patient profile:   45 year old male Height:      69 inches (175.26 cm) Weight:      278.04 pounds (126.38 kg) BMI:     41.21 O2 Sat:      100 % Temp:     97.1 degrees F (36.17 degrees C) oral Pulse rate:   87 / minute BP sitting:   130 / 83  (right arm) Cuff size:   large  Vitals Entered By: Sander Nephew RN (April 10, 2010 8:54 AM)  O2 Flow:  Room air   Prevention & Chronic Care Immunizations   Influenza vaccine: Fluvax Non-MCR  (01/05/2009)    Tetanus booster: Not documented    Pneumococcal vaccine: Pneumovax  (09/08/2006)  Other Screening   PSA: Not documented   Smoking status: never  (04/10/2010)  Diabetes Mellitus   HgbA1C: >14.0  (04/09/2010)   HgbA1C action/deferral: Ordered  (04/05/2010)    Eye exam: Not documented   Diabetic eye exam action/deferral: Ophthalmology referral  (04/10/2010)    Foot exam: yes  (04/05/2010)   Foot exam action/deferral: Do today   High risk foot: Yes  (04/05/2010)   Foot care education: Done  (04/05/2010)    Urine  microalbumin/creatinine ratio: Not documented   Urine microalbumin action/deferral: Ordered    Diabetes flowsheet reviewed?: Yes   Progress toward A1C goal: Unchanged  Lipids   Total Cholesterol: 239  (12/16/2009)   LDL: 159  (12/08/2008)   LDL Direct: Not documented   HDL: 47  (12/16/2009)   Triglycerides: 531  (12/16/2009)  Hypertension   Last Blood Pressure: 130 / 83  (04/10/2010)   Serum creatinine: 1.06  (12/21/2009)   Serum potassium 4.0  (12/21/2009)    Hypertension flowsheet reviewed?: Yes   Progress toward BP goal: At goal  Self-Management Support :   Personal Goals (by the next clinic visit) :     Personal A1C goal: 9  (04/10/2010)     Personal blood pressure goal: 130/80  (04/10/2010)     Personal LDL goal: 100  (04/10/2010)    Patient will work on the following items until the next clinic visit to reach self-care goals:     Medications and monitoring: take my medicines every day, check my blood sugar, bring all of my medications to every visit, examine my feet every day  (04/10/2010)     Eating: drink diet soda or water instead of juice or soda, eat more vegetables, use fresh or frozen vegetables, eat foods that are low  in salt, eat baked foods instead of fried foods, eat fruit for snacks and desserts, limit or avoid alcohol  (04/10/2010)     Activity: take a 30 minute walk every day  (04/10/2010)    Diabetes self-management support: Education handout, Engineer, technical sales, Resources for patients handout, Written self-care plan  (04/10/2010)   Diabetes care plan printed   Diabetes education handout printed   Last diabetes self-management training by diabetes educator: 04/09/2010    Hypertension self-management support: Education handout, Engineer, technical sales, Resources for patients handout, Written self-care plan  (04/10/2010)   Hypertension self-care plan printed.   Hypertension education handout printed      Resource handout  printed.   Nursing Instructions: Refer for screening diabetic eye exam (see order)

## 2010-05-15 ENCOUNTER — Ambulatory Visit: Payer: Self-pay | Admitting: Internal Medicine

## 2010-05-16 ENCOUNTER — Encounter (INDEPENDENT_AMBULATORY_CARE_PROVIDER_SITE_OTHER): Payer: Self-pay | Admitting: *Deleted

## 2010-05-16 NOTE — Assessment & Plan Note (Signed)
Summary: NP [facility] KNEE PAIN/MJD   Vital Signs:  Patient profile:   45 year old male BP sitting:   138 / 22  Vitals Entered By: Caesar Chestnut RN (May 01, 2010 2:50 PM)  Primary Care Provider:  none   History of Present Illness: 45 year old morbidly obese male with HIV, wildly out of control DM with BS at approaching 7 yesterday, multiple recent hospitalizations for for DM. One recent hosp for HONK? with BS at 1200.  He presents with R knee pain and effusion that had an insidious onset for approx 6 months. No specific injury. No mechanical locking up of his knee. No symptomatic giving way.  Plain films reviewed from the PACS system, and he does not have any significant OA apparent on his NWB films.   Allergies: 1)  ! Sulfa  Past History:  Past medical, surgical, family and social histories (including risk factors) reviewed, and no changes noted (except as noted below).  Past Medical History: Reviewed history from 05/19/2008 and no changes required. HIV disease Diabetes mellitus, type II Hypertension  Family History: Reviewed history and no changes required.  Social History: Reviewed history and no changes required.  Review of Systems       as above, recent hosp, much urination, sensation of dehydration. No CP, no SOB. Has a housing situation being arranged by PCP to help with med management. Otherwise, the pertinent positives and negatives are listed above and in the HPI, otherwise a full review of systems has been reviewed and is negative unless noted positive.  MS:  Complains of joint pain, joint swelling, muscle weakness, and stiffness; denies joint redness. Derm:  dry skin.  Physical Exam  General:  Well-developed,well-nourished,in no acute distress; alert,appropriate and cooperative throughout examination Head:  Normocephalic and atraumatic without obvious abnormalities. No apparent alopecia or balding. Ears:  no external deformities.   Nose:  no external  deformity.   Lungs:  normal respiratory effort.   Msk:  R knee: full ext flexion to 90. Ballotable effusion. TTP medial and lateral joint lines. Ant drawer and post drawer stable. stable to varus and valgus stress. + bounce home. all meniscal testing equivocal given significant effusion. Patella NT at facet loading medially and laterally, and without apprehension.  Good str.   Impression & Recommendations:  Problem # 1:  INTERNAL DERANGEMENT, RIGHT KNEE (ICD-717.9) Assessment New 6 month history - suspect could be meniscal pathology or other internal derangement. Without significant OA.  This patient is medically unstable with a BS of 600 yesterday and HIV. There is minimal that I can offer this patient until he becomes more stable from a medical standpoint.  I discussed with him the seriousness of his DM and that if he continued at his current rate, he has significant risk of death.  The patient has medicaid. I would give serious consideration to an insulin pump in this patient who is theoretically taking 90 units of lantus a day.  He will start some Mobic, cont with tramadol and ice his knee as needed. Then follow-up when his BS become stablized.  The patient made multiple requests for percocet.  Problem # 2:  KNEE PAIN, RIGHT, CHRONIC (ICD-719.46) Assessment: New  His updated medication list for this problem includes:    Tramadol Hcl 50 Mg Tabs (Tramadol hcl) .Marland Kitchen... Take 1-2 tablets by mouth every 6 hours as needed for pain    Meloxicam 15 Mg Tabs (Meloxicam) ..... One by mouth daily  Problem # 3:  DIABETES  MELLITUS, TYPE II (ICD-250.00) Assessment: New  His updated medication list for this problem includes:    Glucophage 1000 Mg Tabs (Metformin hcl) .Marland Kitchen... Take 1 tablet by mouth two times a day    Lisinopril 40 Mg Tabs (Lisinopril) .Marland Kitchen... Take 1 tablet by mouth once a day    Lantus Solostar 100 Unit/ml Soln (Insulin glargine) .Marland Kitchen... 90 units once daily  Problem # 4:  HIV  DISEASE (ICD-042) Assessment: New  Complete Medication List: 1)  Truvada 200-300 Mg Tabs (Emtricitabine-tenofovir) .... Take 1 tablet by mouth once a day 2)  Reyataz 300 Mg Caps (Atazanavir sulfate) .... Take 1 tablet by mouth once a day 3)  Dapsone 100 Mg Tabs (Dapsone) .... Take 1 tablet by mouth once a day 4)  Glucophage 1000 Mg Tabs (Metformin hcl) .... Take 1 tablet by mouth two times a day 5)  Androgel 50 Mg/5gm Gel (Testosterone) .... Apply one packet q day 6)  Norvir 100 Mg Tabs (Ritonavir) .... Take 1 tablet by mouth once a day 7)  Aspirin 81 Mg  .... Take one tablet by mouth daily. 8)  Lisinopril 40 Mg Tabs (Lisinopril) .... Take 1 tablet by mouth once a day 9)  Zidovudine 300 Mg Tabs (Zidovudine) .... One tablet by mouth two times a day 10)  Atenolol 50 Mg Tabs (Atenolol) .... Take 1 tablet by mouth once a day 11)  Accu-chek Aviva Plus Strp (Glucose blood) .... Use to check lbood sugar twice a day- morning and evening before eating- when you give the lantus insulin 12)  Accu-chek Multiclix Lancets Misc (Lancets) .... Use to check blood sugar up to 2 times daily 13)  Tramadol Hcl 50 Mg Tabs (Tramadol hcl) .... Take 1-2 tablets by mouth every 6 hours as needed for pain 14)  Pen Needles 31g X 6 Mm Misc (Insulin pen needle) .... Use to inject insulin one time a day (250.00) 15)  Lantus Solostar 100 Unit/ml Soln (Insulin glargine) .... 90 units once daily 16)  Meloxicam 15 Mg Tabs (Meloxicam) .... One by mouth daily  Patient Instructions: 1)  Before we can do anything, your BLOOD SUGAR HAS TO BE UNDER BETTER CONTROL. 2)  Start taking the Meloxicam once a day 3)  Tramadol 1-2 tablets up to 4 times a day for pain. 4)  Ice or heat is OK for pain relief. 5)  follow-up in 1 month, but YOU NEED YOUR BLOOD SUGAR UNDER BETTER CONTROL. Prescriptions: MELOXICAM 15 MG TABS (MELOXICAM) one by mouth daily  #30 x 5   Entered and Authorized by:   Owens Loffler MD   Signed by:   Owens Loffler  MD on 05/01/2010   Method used:   Print then Give to Patient   RxID:   [id number]    Orders Added: 1)  New Patient Level IV [id number]

## 2010-05-16 NOTE — Miscellaneous (Signed)
  Clinical Lists Changes  Observations: Added new observation of #CHILD<18 IN: Unknown (05/07/2010 15:54) Added new observation of YEARLYEXPEN: 0  (05/07/2010 15:54)

## 2010-05-21 ENCOUNTER — Other Ambulatory Visit: Payer: Self-pay | Admitting: Emergency Medicine

## 2010-05-21 ENCOUNTER — Ambulatory Visit: Payer: Self-pay | Admitting: Physical Medicine & Rehabilitation

## 2010-05-21 ENCOUNTER — Emergency Department (HOSPITAL_COMMUNITY)
Admission: EM | Admit: 2010-05-21 | Discharge: 2010-05-22 | Disposition: A | Discharge status: Home / Self Care | Payer: Medicaid Other | Attending: Emergency Medicine | Admitting: Emergency Medicine

## 2010-05-21 ENCOUNTER — Ambulatory Visit: Payer: Medicaid Other | Admitting: Internal Medicine

## 2010-05-21 ENCOUNTER — Encounter: Payer: Medicaid Other | Attending: Physical Medicine & Rehabilitation

## 2010-05-21 ENCOUNTER — Emergency Department: Admission: EM | Admit: 2010-05-21 | Payer: Medicaid Other | Source: Home / Self Care

## 2010-05-21 DIAGNOSIS — Z794 Long term (current) use of insulin: Secondary | ICD-10-CM | POA: Insufficient documentation

## 2010-05-21 DIAGNOSIS — E1169 Type 2 diabetes mellitus with other specified complication: Secondary | ICD-10-CM | POA: Insufficient documentation

## 2010-05-21 DIAGNOSIS — G8929 Other chronic pain: Secondary | ICD-10-CM | POA: Insufficient documentation

## 2010-05-21 DIAGNOSIS — I10 Essential (primary) hypertension: Secondary | ICD-10-CM | POA: Insufficient documentation

## 2010-05-21 DIAGNOSIS — Z21 Asymptomatic human immunodeficiency virus [HIV] infection status: Secondary | ICD-10-CM | POA: Insufficient documentation

## 2010-05-21 DIAGNOSIS — M25569 Pain in unspecified knee: Secondary | ICD-10-CM | POA: Insufficient documentation

## 2010-05-21 LAB — POCT I-STAT, CHEM 8
BUN: 20 mg/dL (ref 6–23)
Calcium, Ion: 1.11 mmol/L — ABNORMAL LOW (ref 1.12–1.32)
Chloride: 93 mEq/L — ABNORMAL LOW (ref 96–112)
Creatinine, Ser: 1.2 mg/dL (ref 0.4–1.5)
Glucose, Bld: 680 mg/dL (ref 70–99)
HCT: 43 % (ref 39.0–52.0)
Hemoglobin: 14.6 g/dL (ref 13.0–17.0)
Potassium: 4.6 mEq/L (ref 3.5–5.1)
Sodium: 128 mEq/L — ABNORMAL LOW (ref 135–145)
TCO2: 27 mmol/L (ref 0–100)

## 2010-05-21 LAB — GLUCOSE, CAPILLARY: Glucose-Capillary: >600 mg/dL (ref 70–99)

## 2010-05-22 ENCOUNTER — Ambulatory Visit: Payer: Medicaid Other | Attending: Physical Medicine & Rehabilitation

## 2010-05-22 LAB — GLUCOSE, CAPILLARY
Glucose-Capillary: 390 mg/dL — ABNORMAL HIGH (ref 70–99)
Glucose-Capillary: 463 mg/dL — ABNORMAL HIGH (ref 70–99)
Glucose-Capillary: 551 mg/dL (ref 70–99)

## 2010-05-24 NOTE — Miscellaneous (Signed)
  Clinical Lists Changes 

## 2010-05-25 IMAGING — CR DG CHEST 1V PORT
1 series · 1 of 1 positions shown · non-contrast
Comparison: 05/21/2009 and 05/15/2009.

CLINICAL DATA: Hyperglycemia.  Hand swelling.

PORTABLE CHEST - 1 VIEW

[AP]
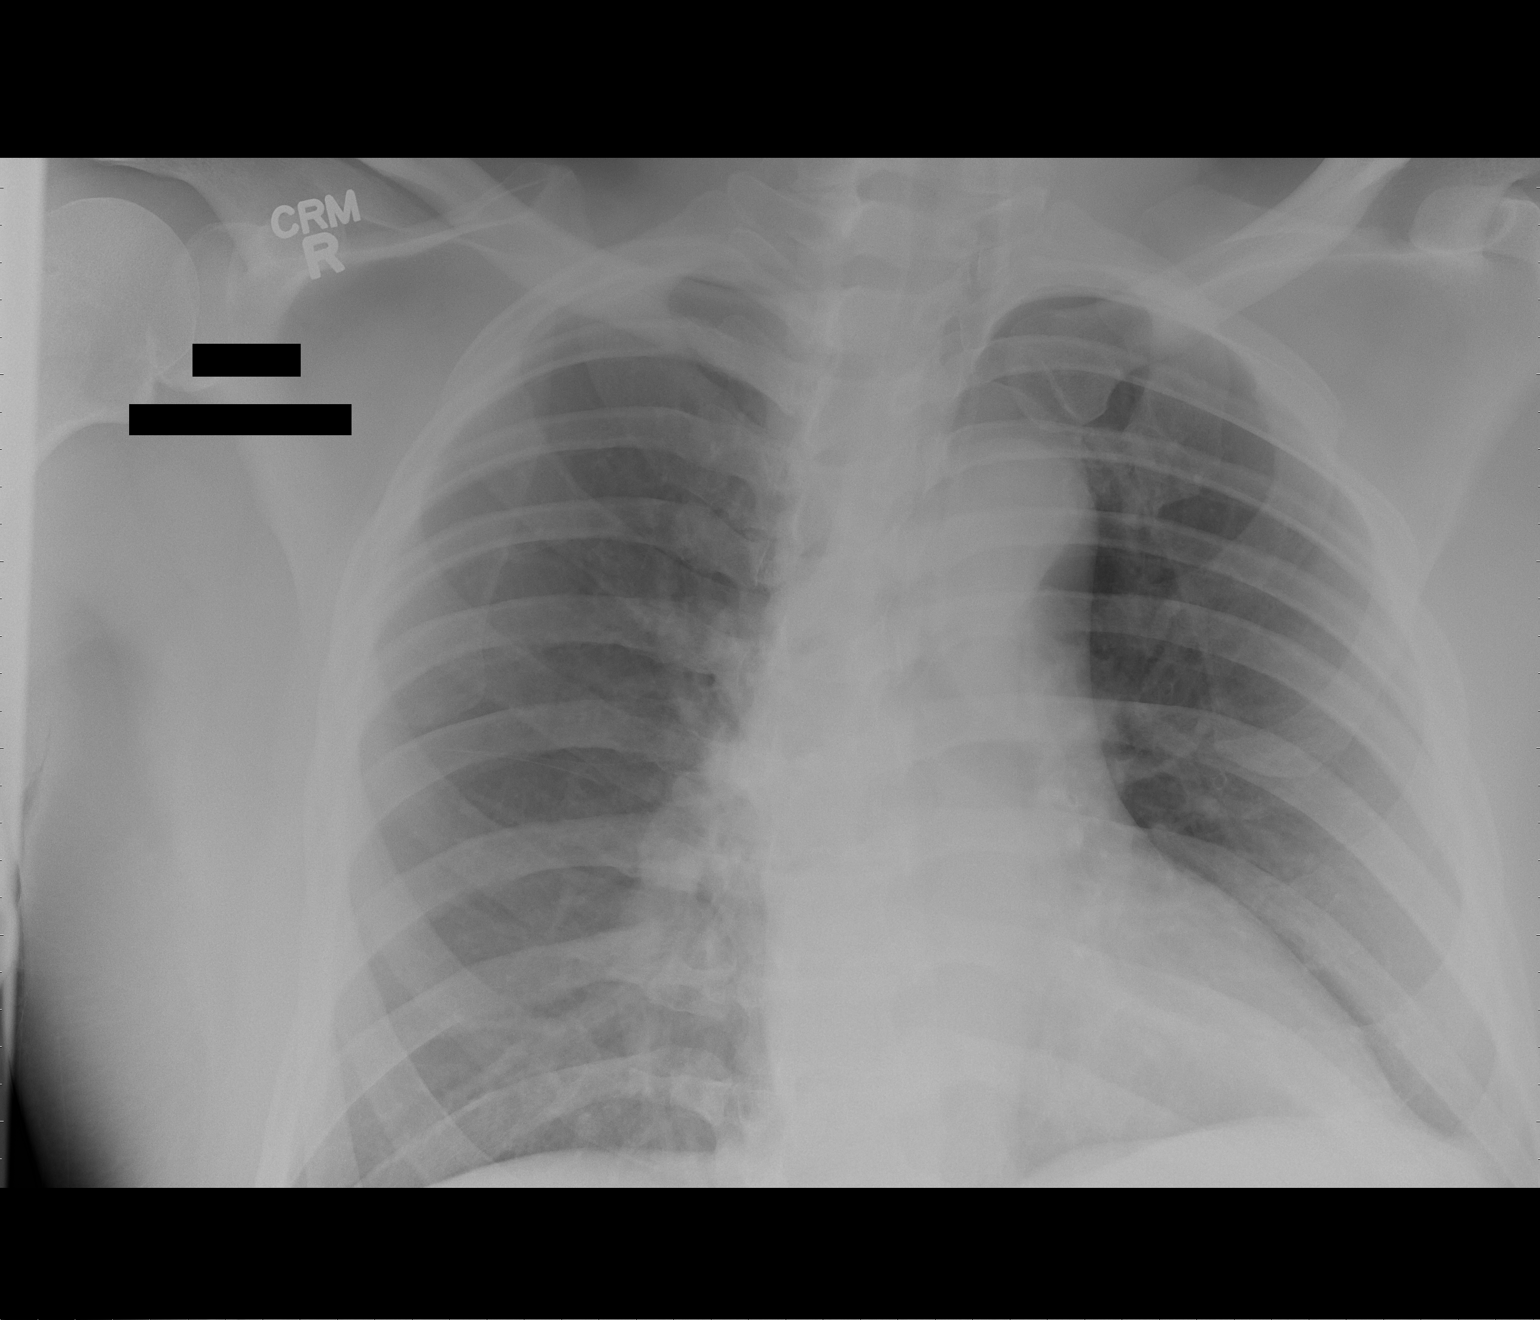

[1 of 1 positions shown; findings below may reference images not displayed]

FINDINGS: 0887 hours. The heart size and mediastinal contours
appear stable for technique.  There is mild patient rotation to the
left.  The lungs are clear.  There is no pleural effusion or
pneumothorax.
IMPRESSION: Stable examination.  No active cardiopulmonary process.

## 2010-05-27 IMAGING — CR DG KNEE 1-2V*L*
2 series · 2 of 2 positions shown · non-contrast
Comparison: None available.

CLINICAL DATA: Pain and swelling.

LEFT KNEE - 1-2 VIEW

[t knee ap left]
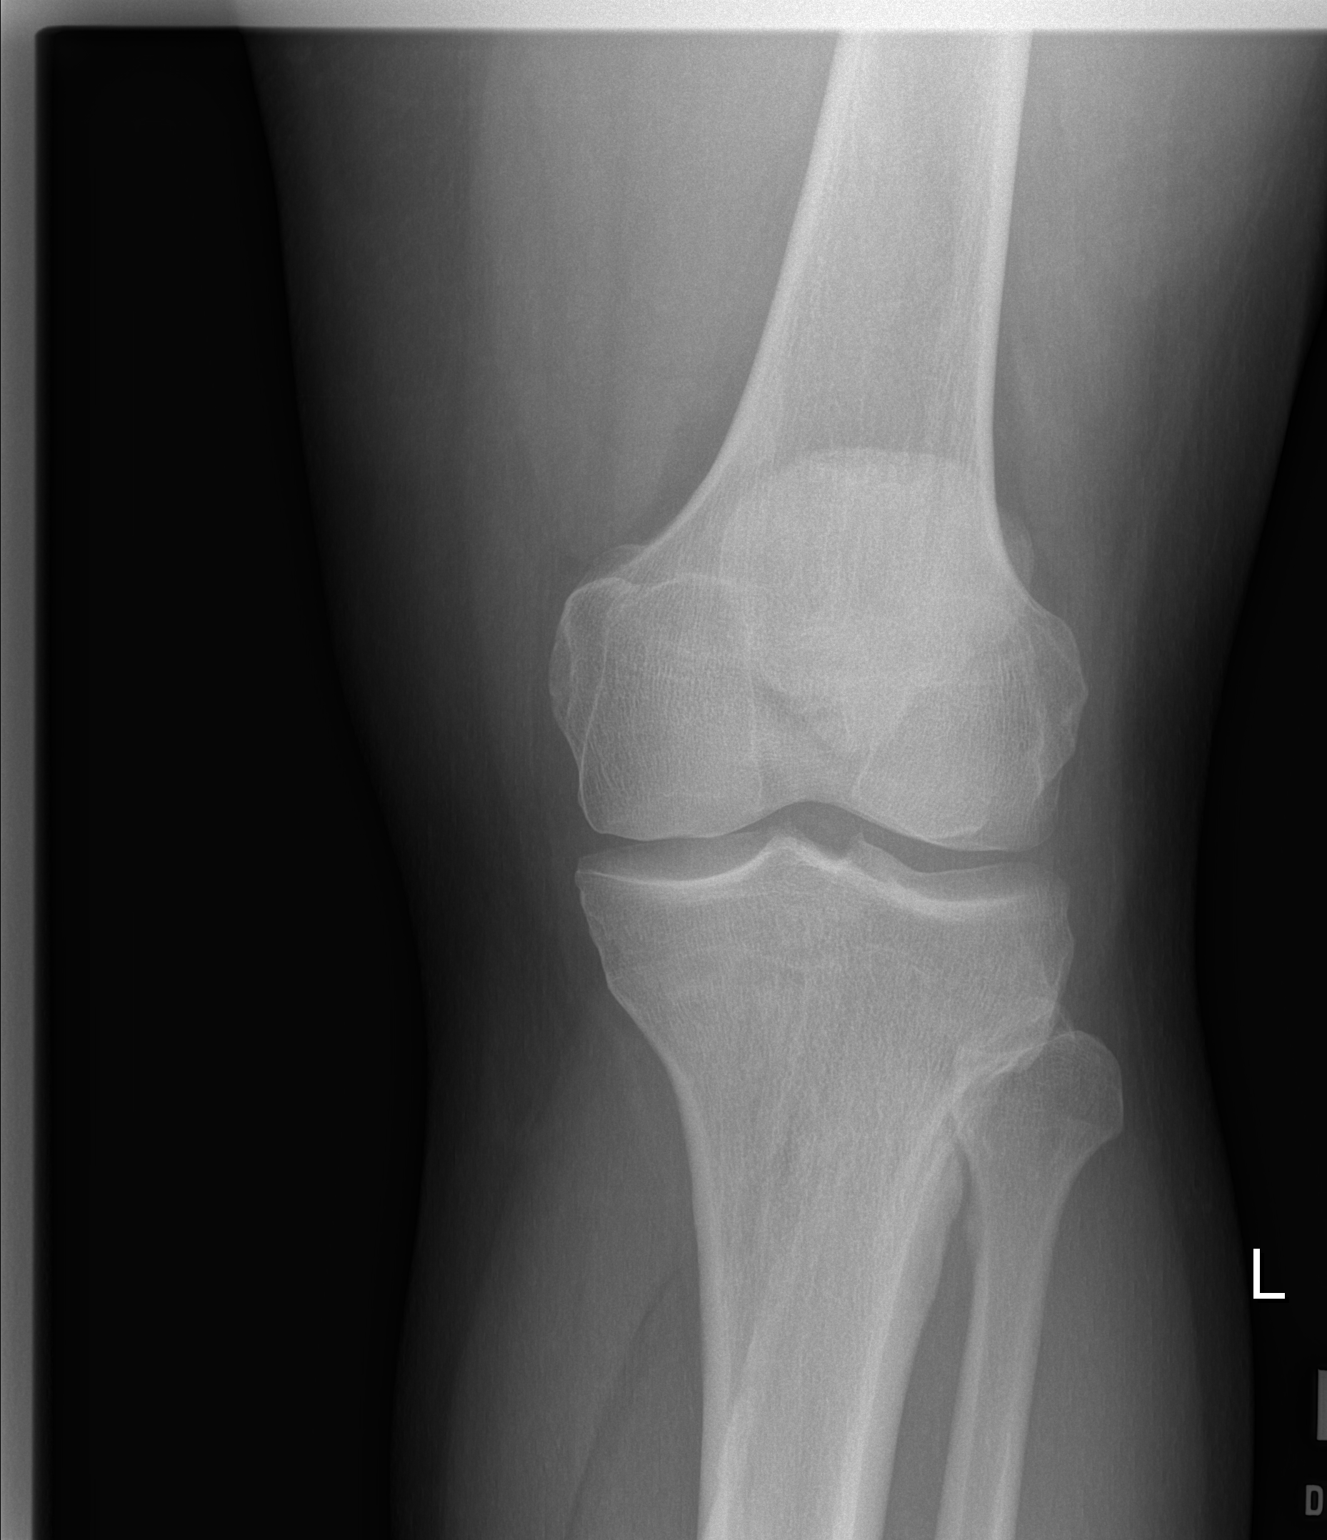

[t knee lat left]
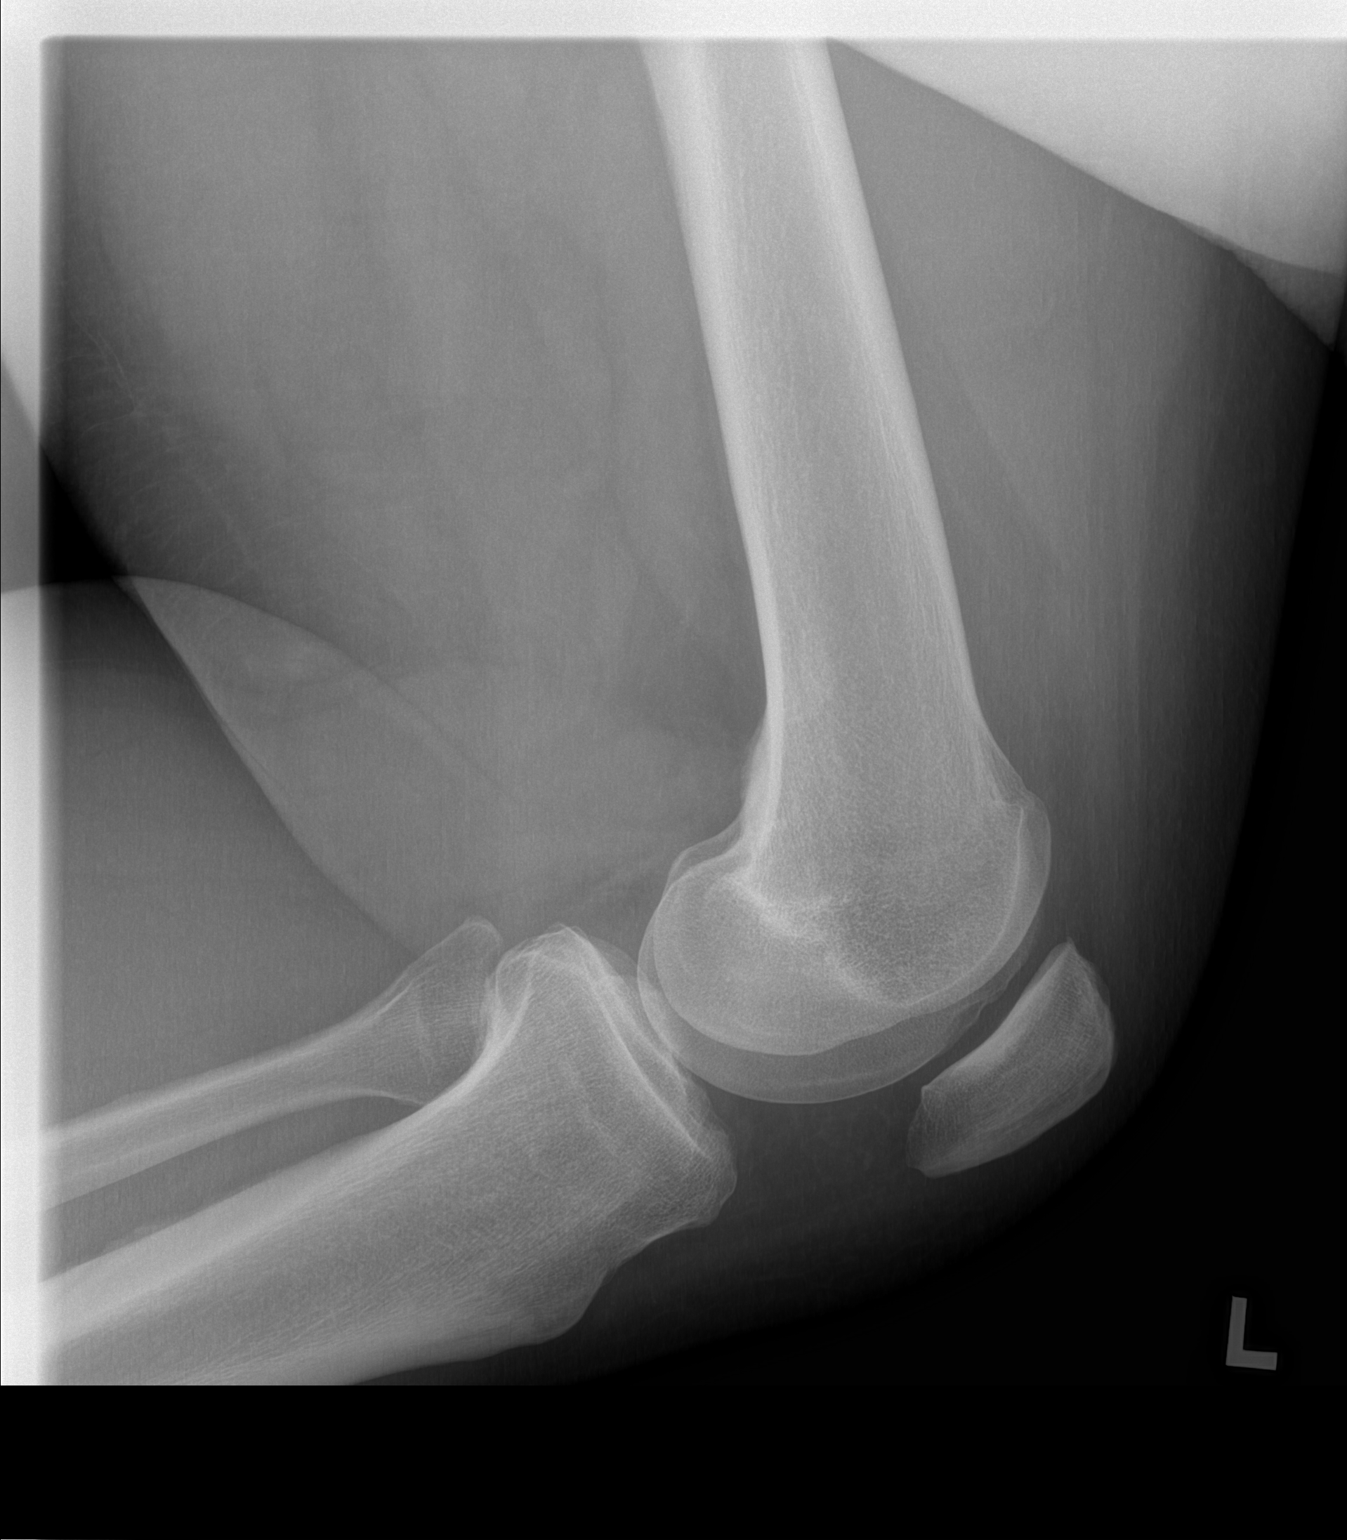

[2 of 2 positions shown; findings below may reference images not displayed]

FINDINGS: There is no fracture, dislocation or joint effusion.  The
patient has very small patellofemoral osteophytes.
IMPRESSION: Mild patellofemoral degenerative disease.  Otherwise negative.

## 2010-05-27 IMAGING — CR DG ANKLE COMPLETE 3+V*L*
3 series · 3 of 3 positions shown · non-contrast
Comparison: None.

CLINICAL DATA: Pain and swelling.

LEFT ANKLE COMPLETE - 3+ VIEW

[t ankle joint ap left (1 of 2)]
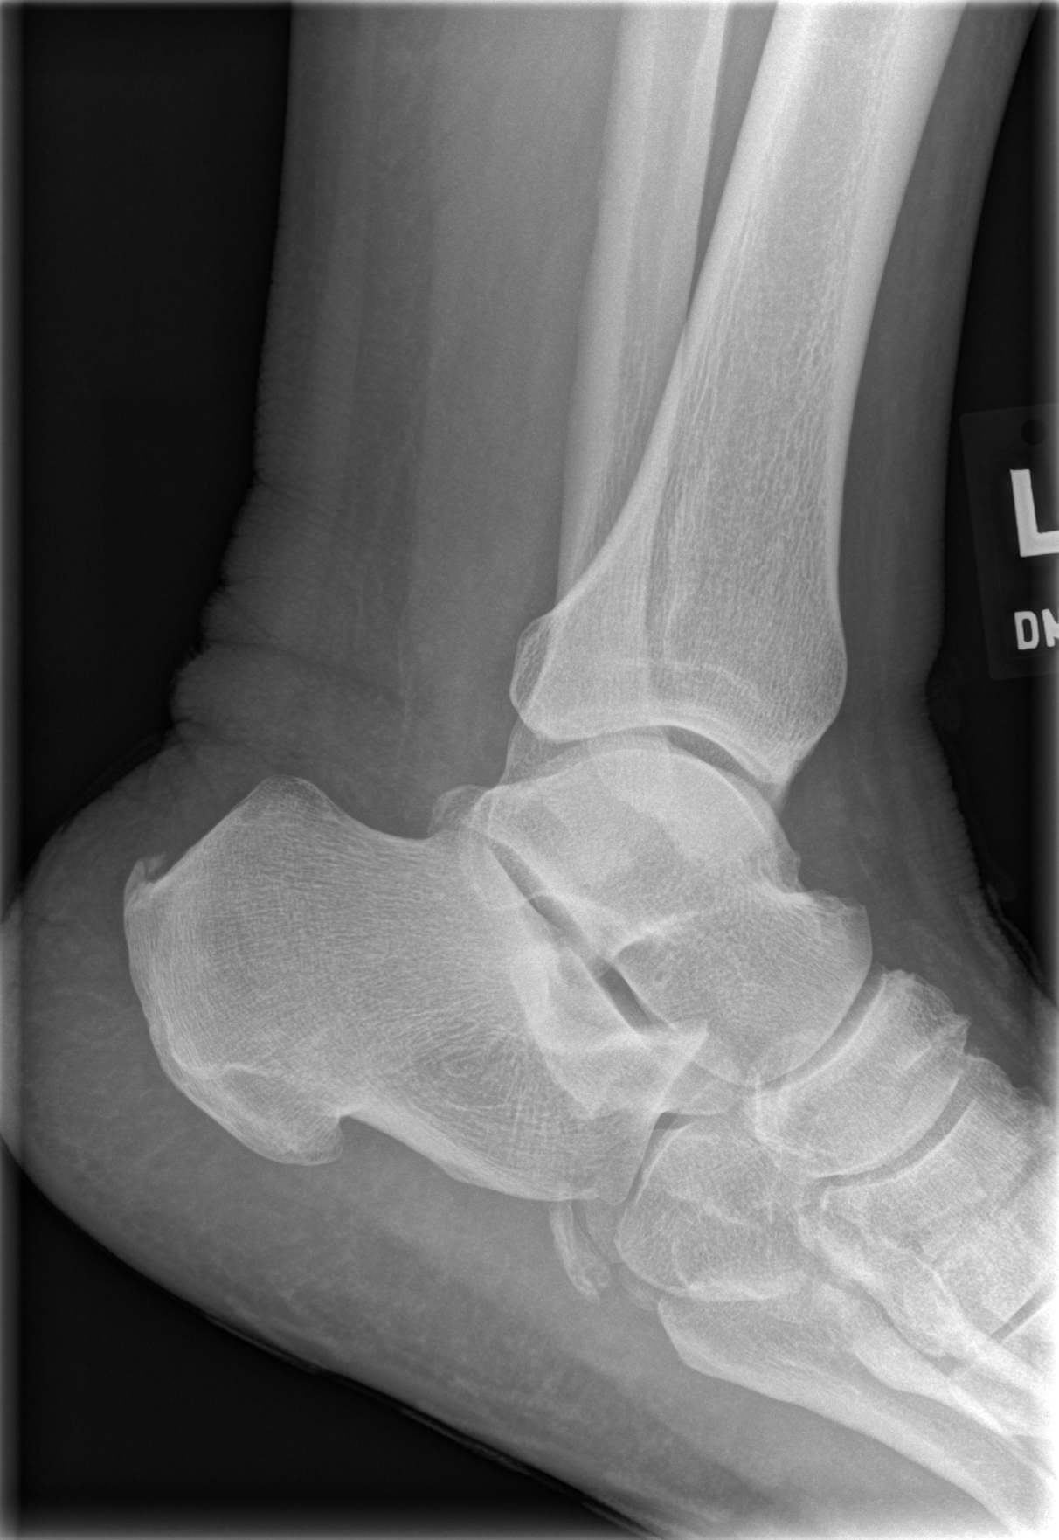

[t ankle joint ap left (2 of 2)]
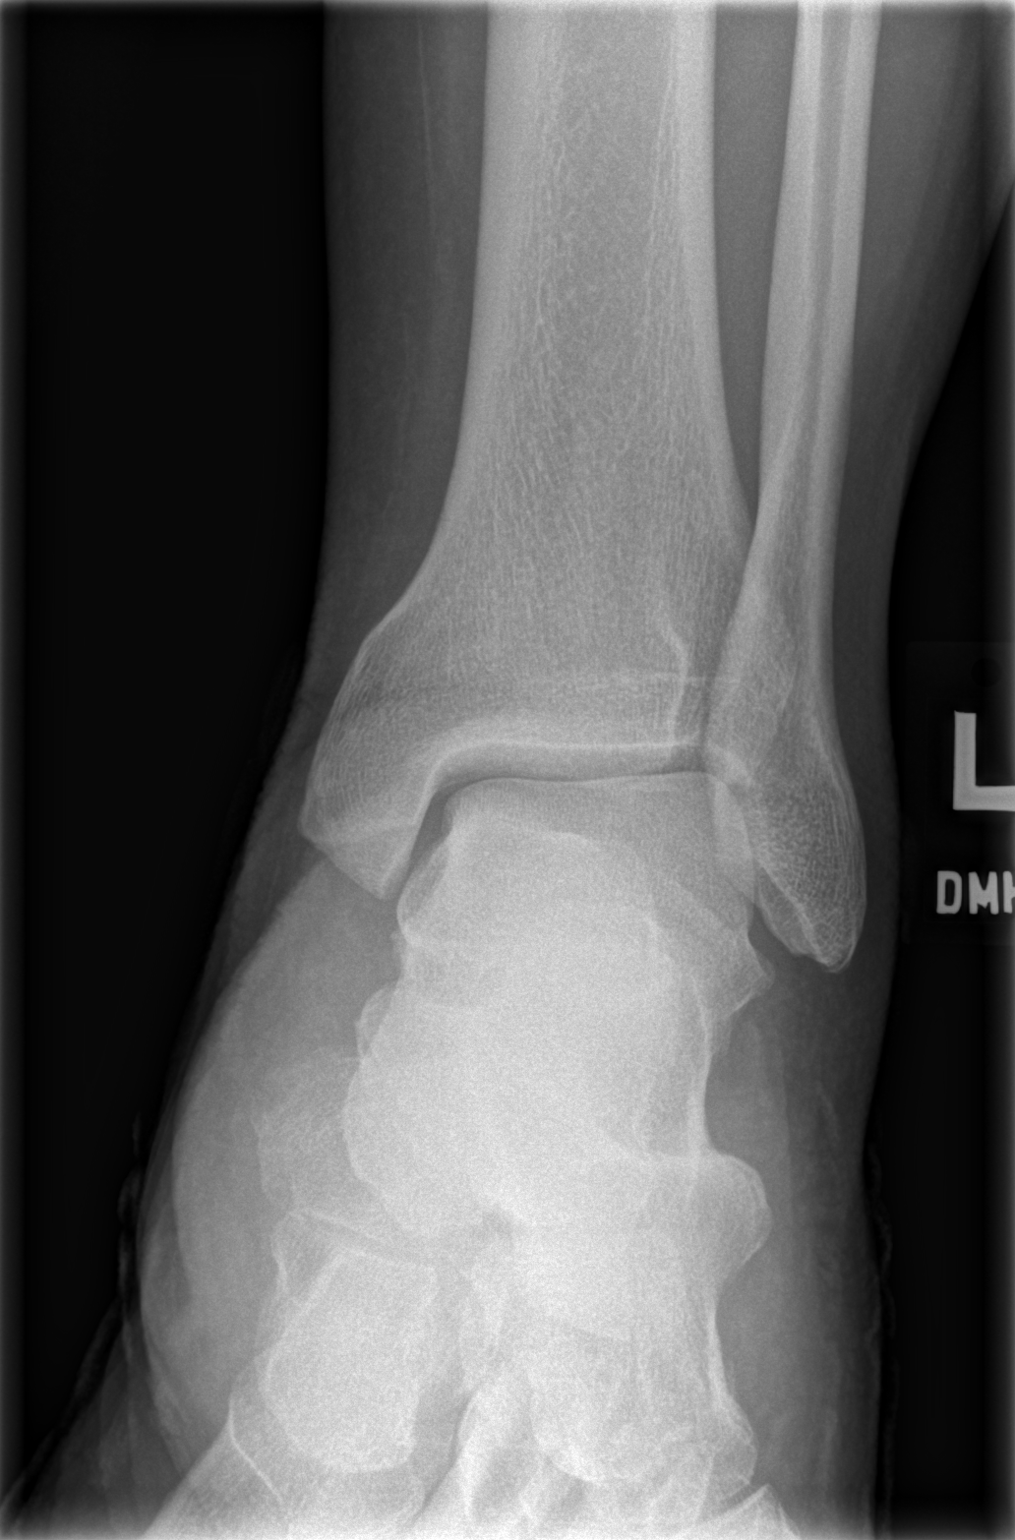

[t ankle joint oblique left]
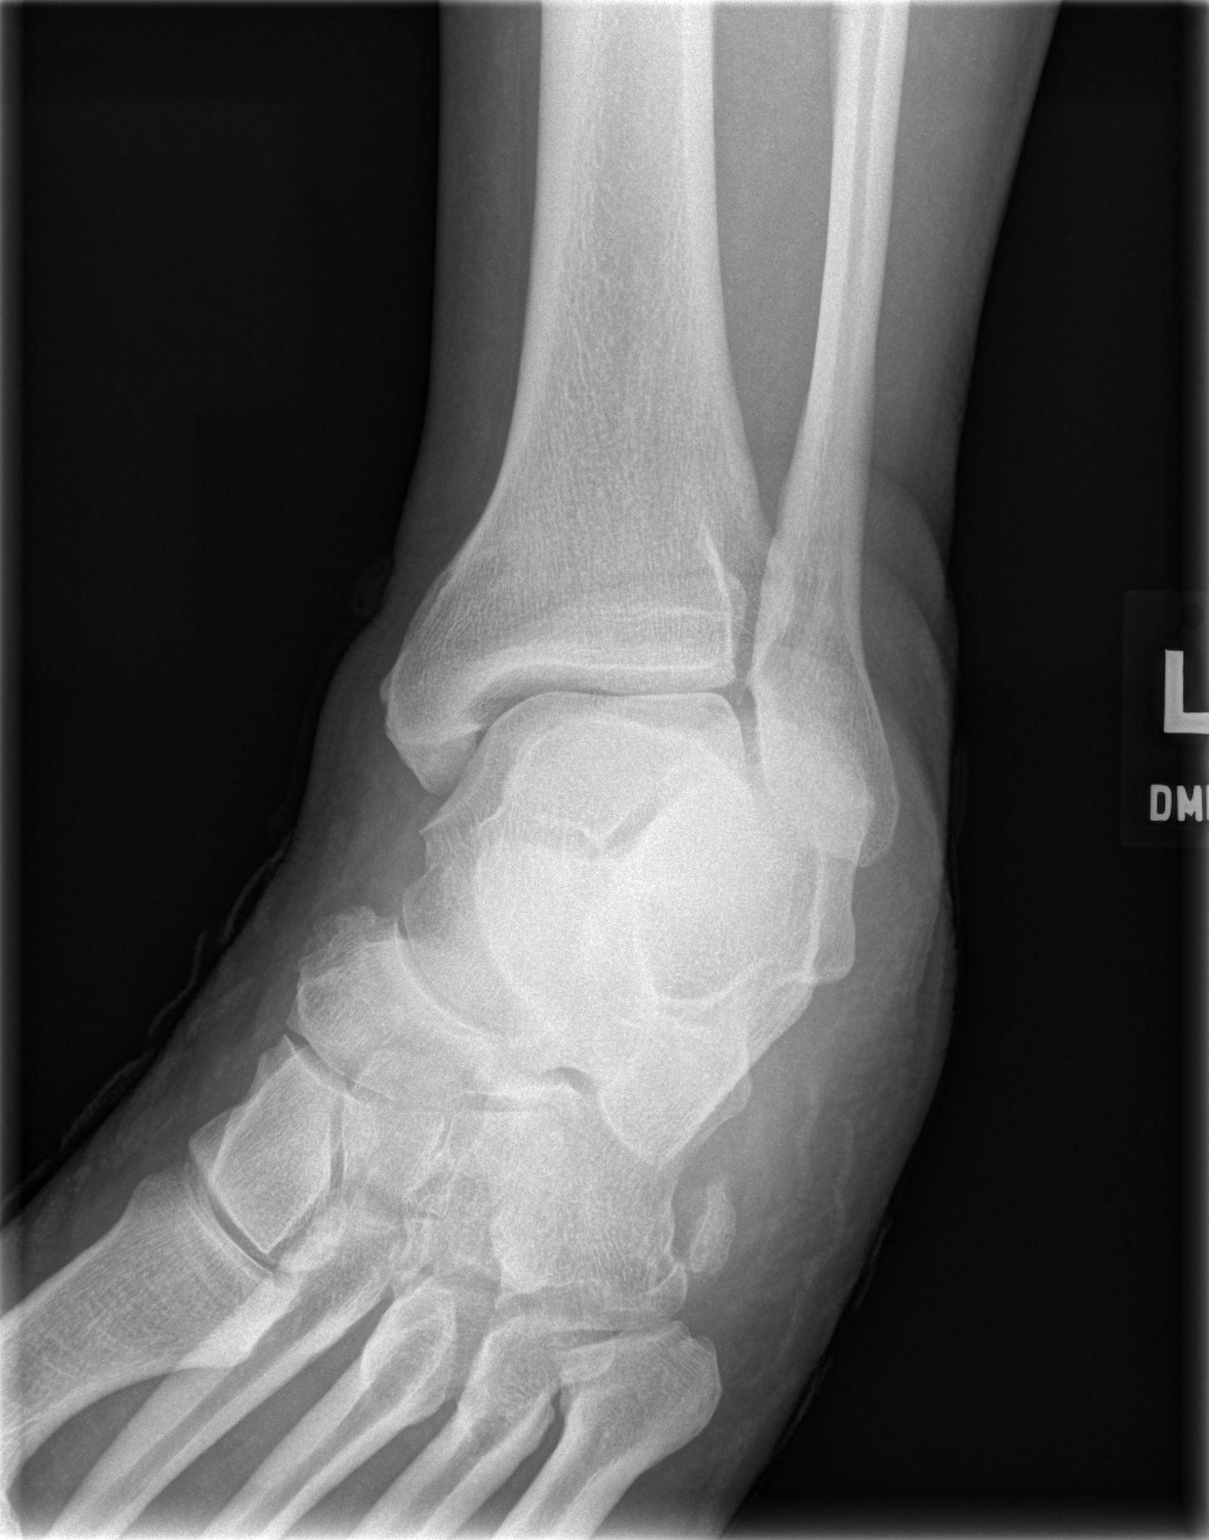

[3 of 3 positions shown; findings below may reference images not displayed]

FINDINGS: No acute bony or joint abnormality is identified.  There
is some midfoot degenerative change.  No tibiotalar joint effusion.
Small dorsal calcaneal spur noted.
IMPRESSION: Mild midfoot degenerative disease and a small dorsal calcaneal
spur.  Otherwise negative.

## 2010-05-28 ENCOUNTER — Encounter (INDEPENDENT_AMBULATORY_CARE_PROVIDER_SITE_OTHER): Payer: Self-pay | Admitting: *Deleted

## 2010-05-29 ENCOUNTER — Ambulatory Visit: Payer: Self-pay | Admitting: Family Medicine

## 2010-06-04 IMAGING — CR DG FINGER THUMB 2+V*L*
3 series · 3 of 3 positions shown · non-contrast
Comparison: Left hand films 01/25/2009.

CLINICAL DATA: Left thumb swelling and abscess.

LEFT THUMB 2+V

[x finger pa left]
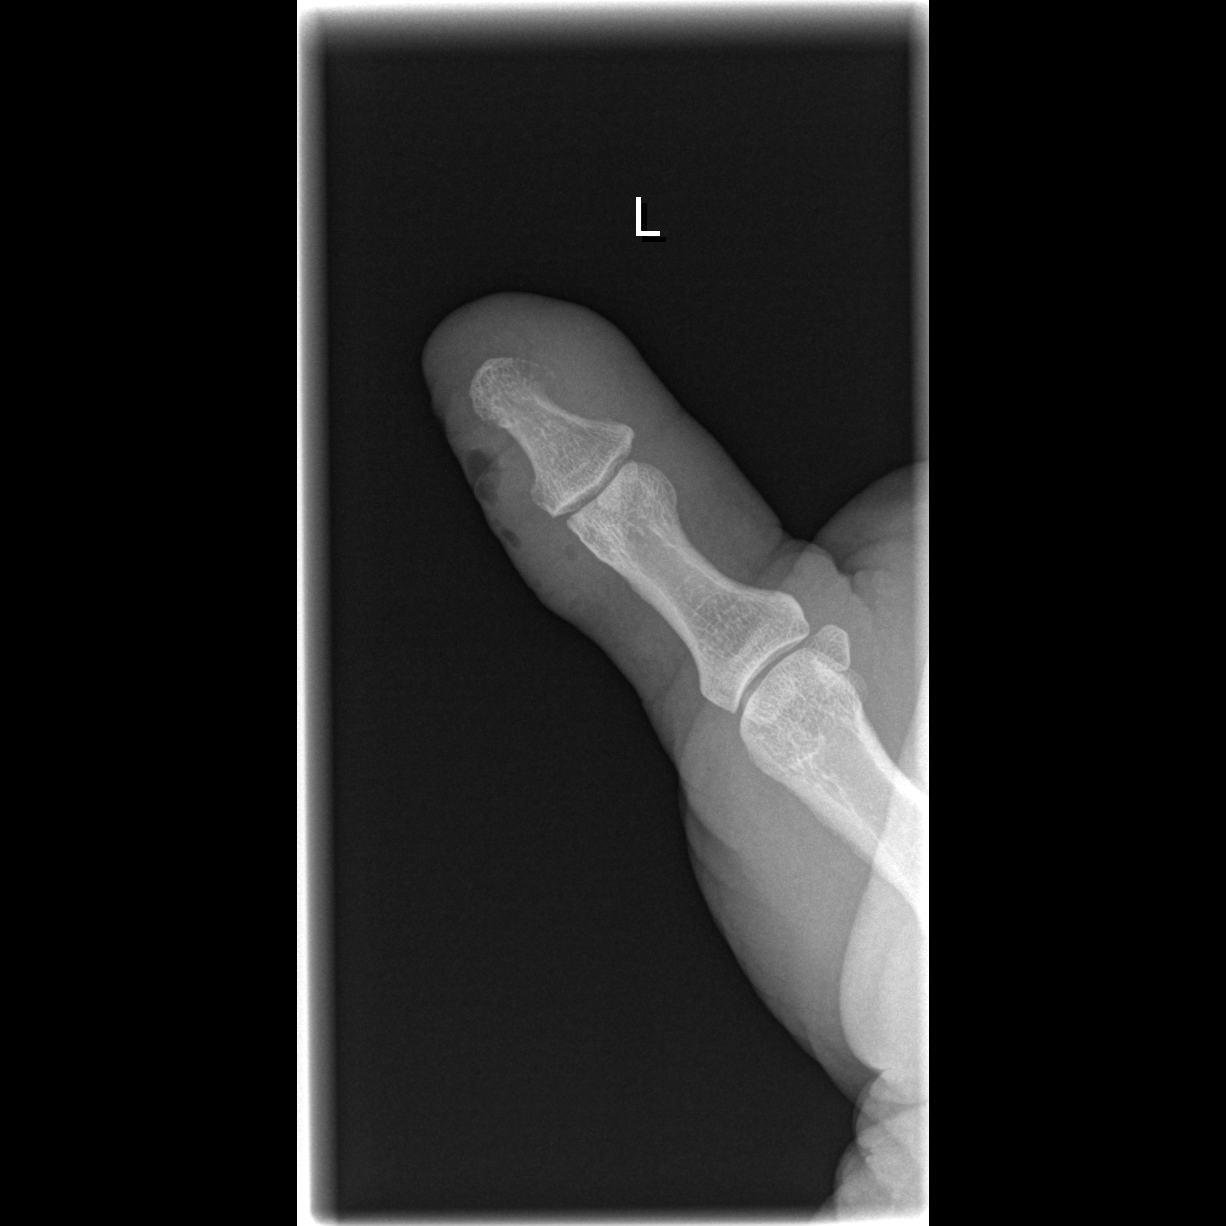

[x finger obl. left]
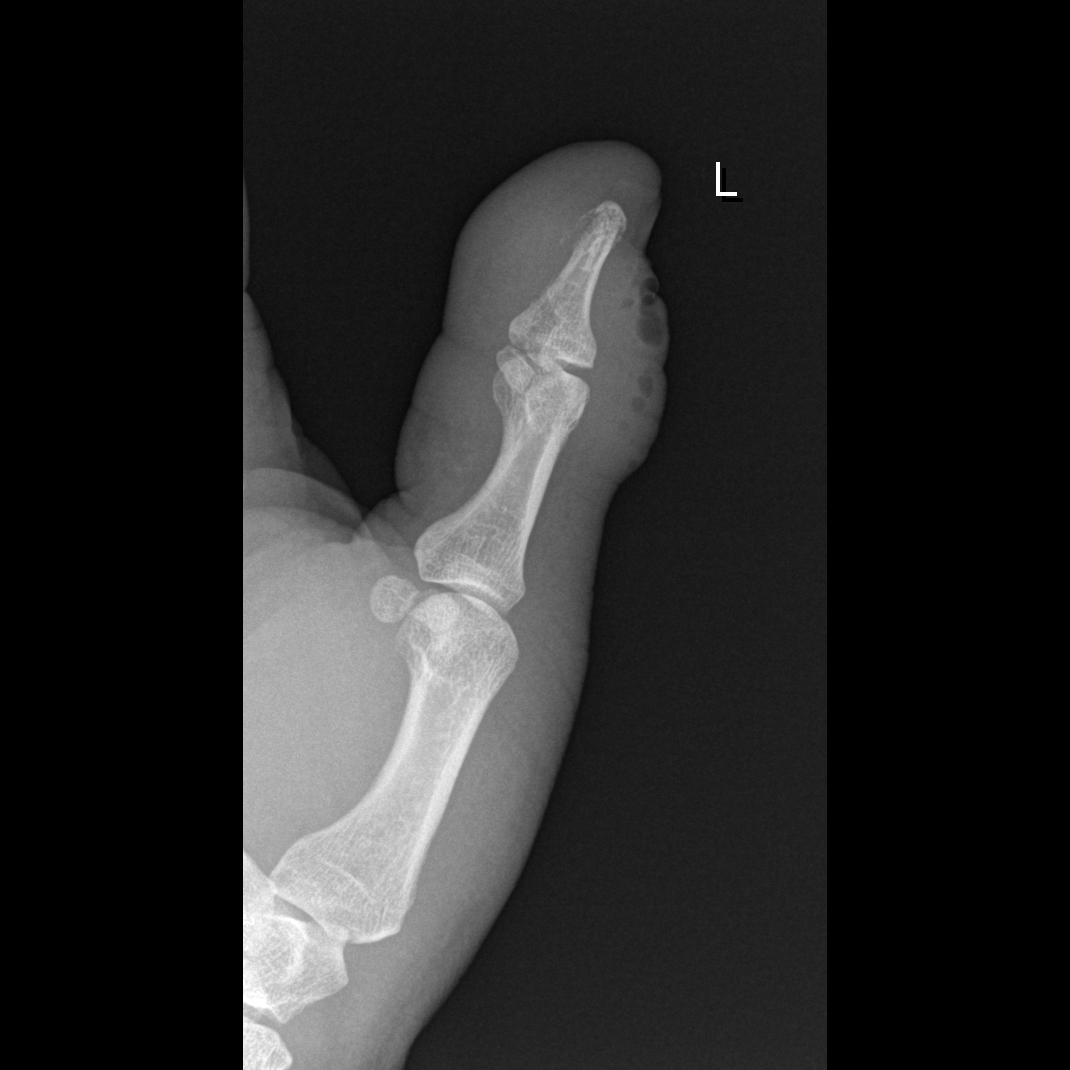

[x finger lateral left]
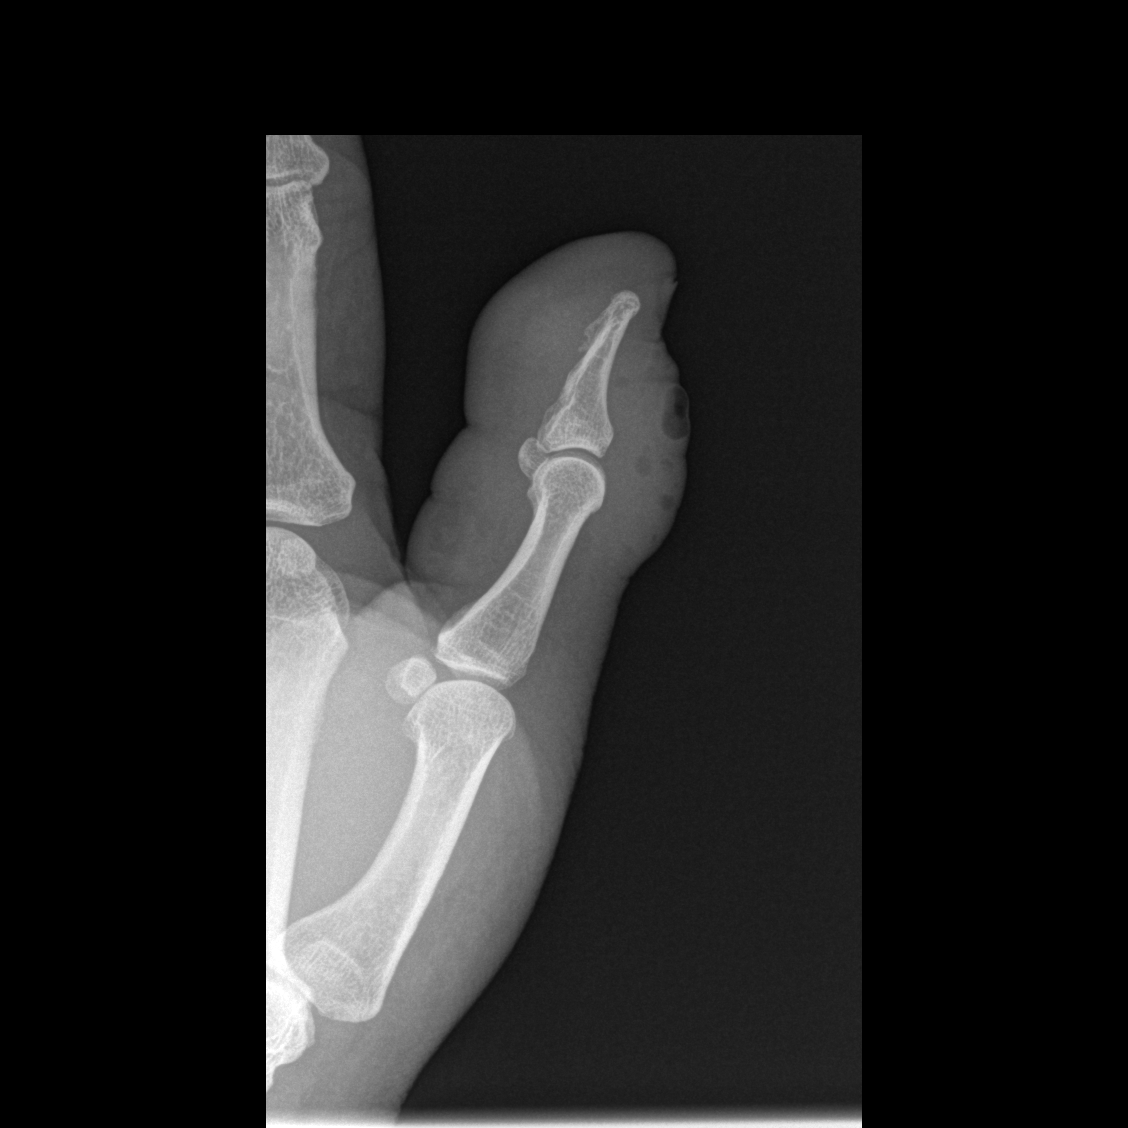

[3 of 3 positions shown; findings below may reference images not displayed]

FINDINGS: Extensive soft tissue swelling is present about the
distal thumb.  There is subcutaneous gas over the dorsal aspect.
Mild degenerative changes of the thumb are stable.  There is some
lucency within the tuft of the distal phalanx.  This may represent
demineralization or developing osteomyelitis.
IMPRESSION: 1.  Extensive soft tissue swelling about the thumb.
2.  Gas over the dorsal aspect of thumb.  This may represent a
developing abscess.
3.  Lucency in the tuft of the distal phalanx is worrisome for
developing osteomyelitis.

## 2010-06-05 IMAGING — CR DG CHEST 1V PORT
2 series · 2 of 2 positions shown · non-contrast
Comparison: 06/28/2009 and earlier.

CLINICAL DATA: 43-year-old male with shortness of breath.
Pneumonia.

PORTABLE CHEST - 1 VIEW

[view not recorded (1 of 2)]
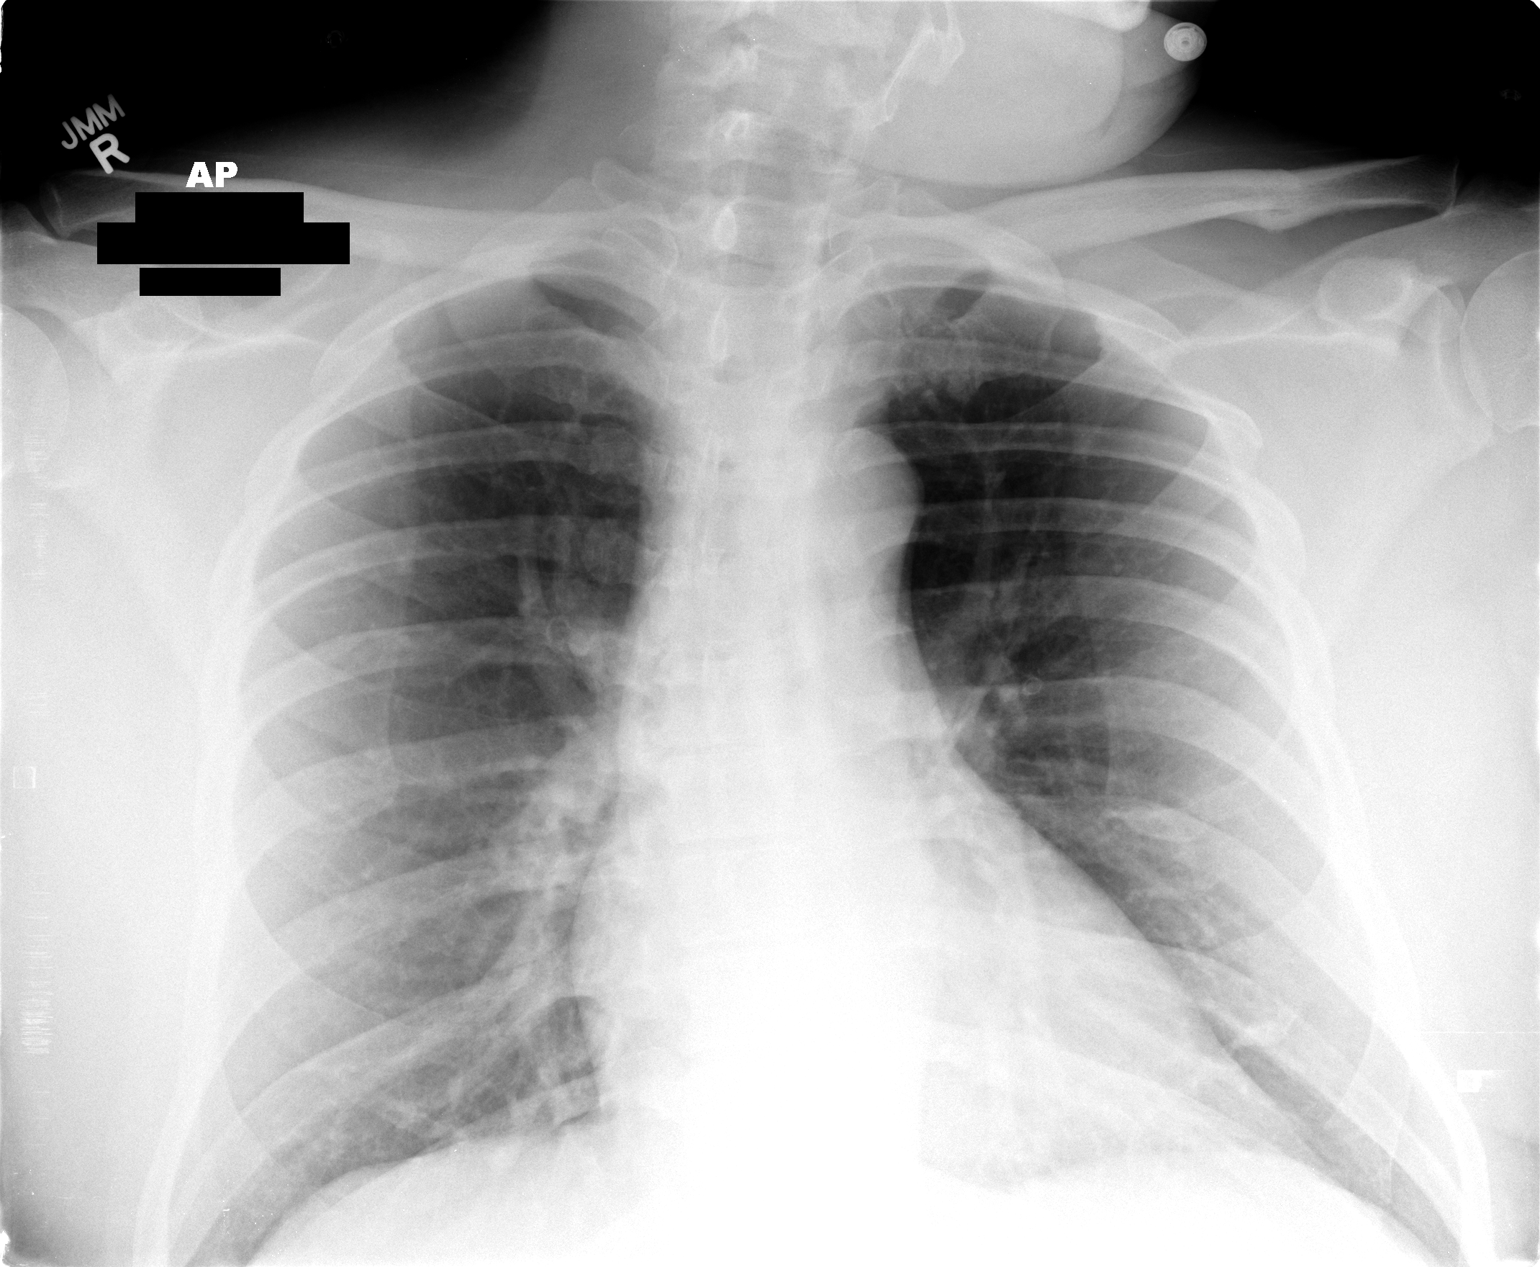

[view not recorded (2 of 2)]
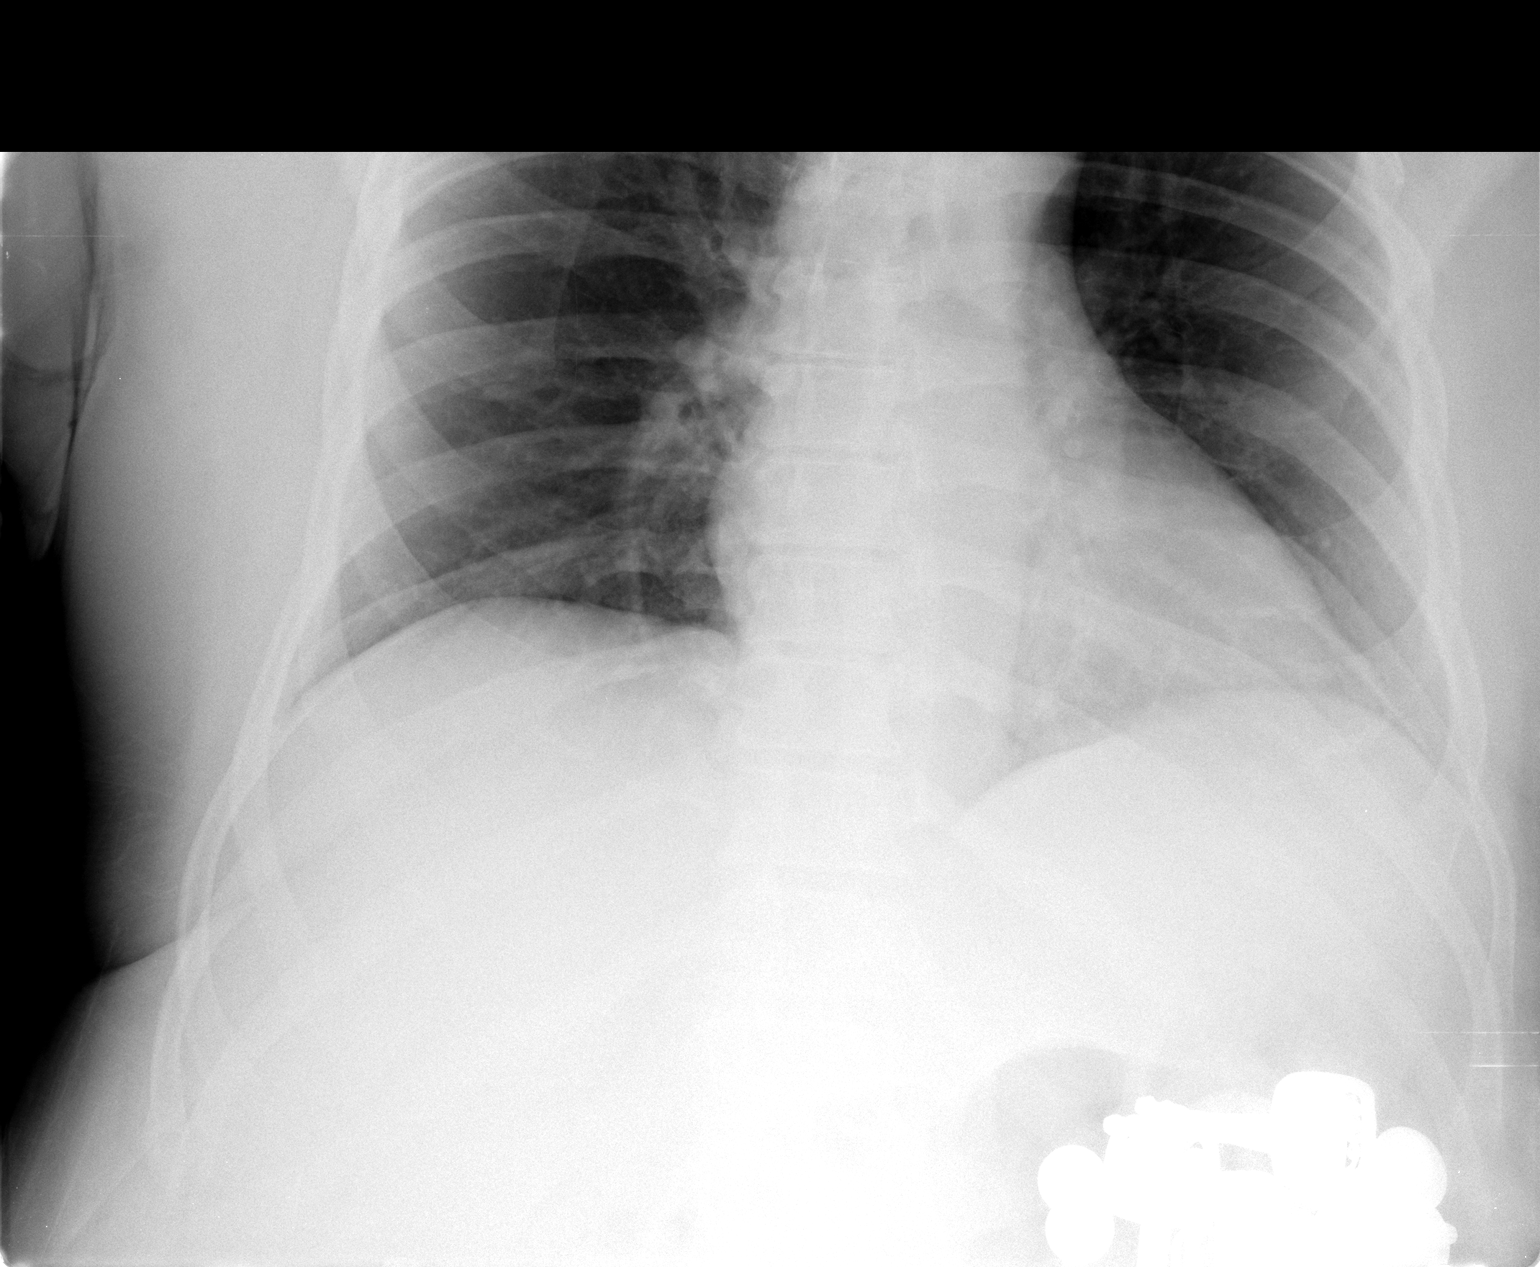

[2 of 2 positions shown; findings below may reference images not displayed]

FINDINGS: AP portable semi upright views the 5233 hours.  Stable
cardiac size and mediastinal contours.  Stable lung volumes.
Allowing for portable technique, the lungs are clear.  No
pneumothorax or pleural effusion.
IMPRESSION: No acute cardiopulmonary abnormality.

## 2010-06-05 NOTE — Consult Note (Signed)
Summary: [facility]   Imported By: Bonner Puna 05/30/2010 10:22:54  _____________________________________________________________________  External Attachment:    Type:   Image     Comment:   External Document

## 2010-06-05 NOTE — Miscellaneous (Signed)
  Clinical Lists Changes  Observations: Added new observation of HIV RISK BEH: Heterosexual contact (05/28/2010 11:19)

## 2010-06-06 IMAGING — NM NM BONE 3 PHASE
1 series · 6 of 6 positions shown · non-contrast
Comparison: None.

CLINICAL DATA: Reason for exam:  Evaluate for osteomyelitis of the
left thumb.  Pain and swelling for 2 weeks.

NUCLEAR MEDICINE THREE PHASE BONE SCAN
TECHNIQUE: Radionuclide angiographic images, immediate static
blood pool images, and 3-hour delayed static images were obtained
after intravenous injection of radiopharmaceutical.
Radiopharmaceutical: 25 mCi technetium MDP IV.

[bf bone flow · 10.03mm/px · 6 of 48 frames shown]
[frame 5/48]
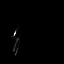
[frame 13/48]
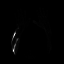
[frame 21/48]
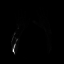
[frame 29/48]
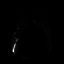
[frame 37/48]
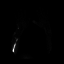
[frame 45/48]
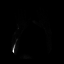

[6 of 6 positions shown; findings below may reference images not displayed]

FINDINGS: Increased tracer activity in the distal aspect of the
left thumb on the dynamic flow, blood pool, and delayed static
images.  Intense increased tracer uptake involving the region of
the base of the distal phalanx of the left thumb on the delayed
static phase three images.
IMPRESSION: Findings compatible with acute osteomyelitis involving the distal
phalanx of the left hilum.

## 2010-06-06 IMAGING — CR DG CHEST 1V PORT
1 series · 1 of 1 positions shown · non-contrast
Comparison: 07/09/2009

CLINICAL DATA: Cellulitis.  PICC line placement.

PORTABLE CHEST - 1 VIEW

[view not recorded]
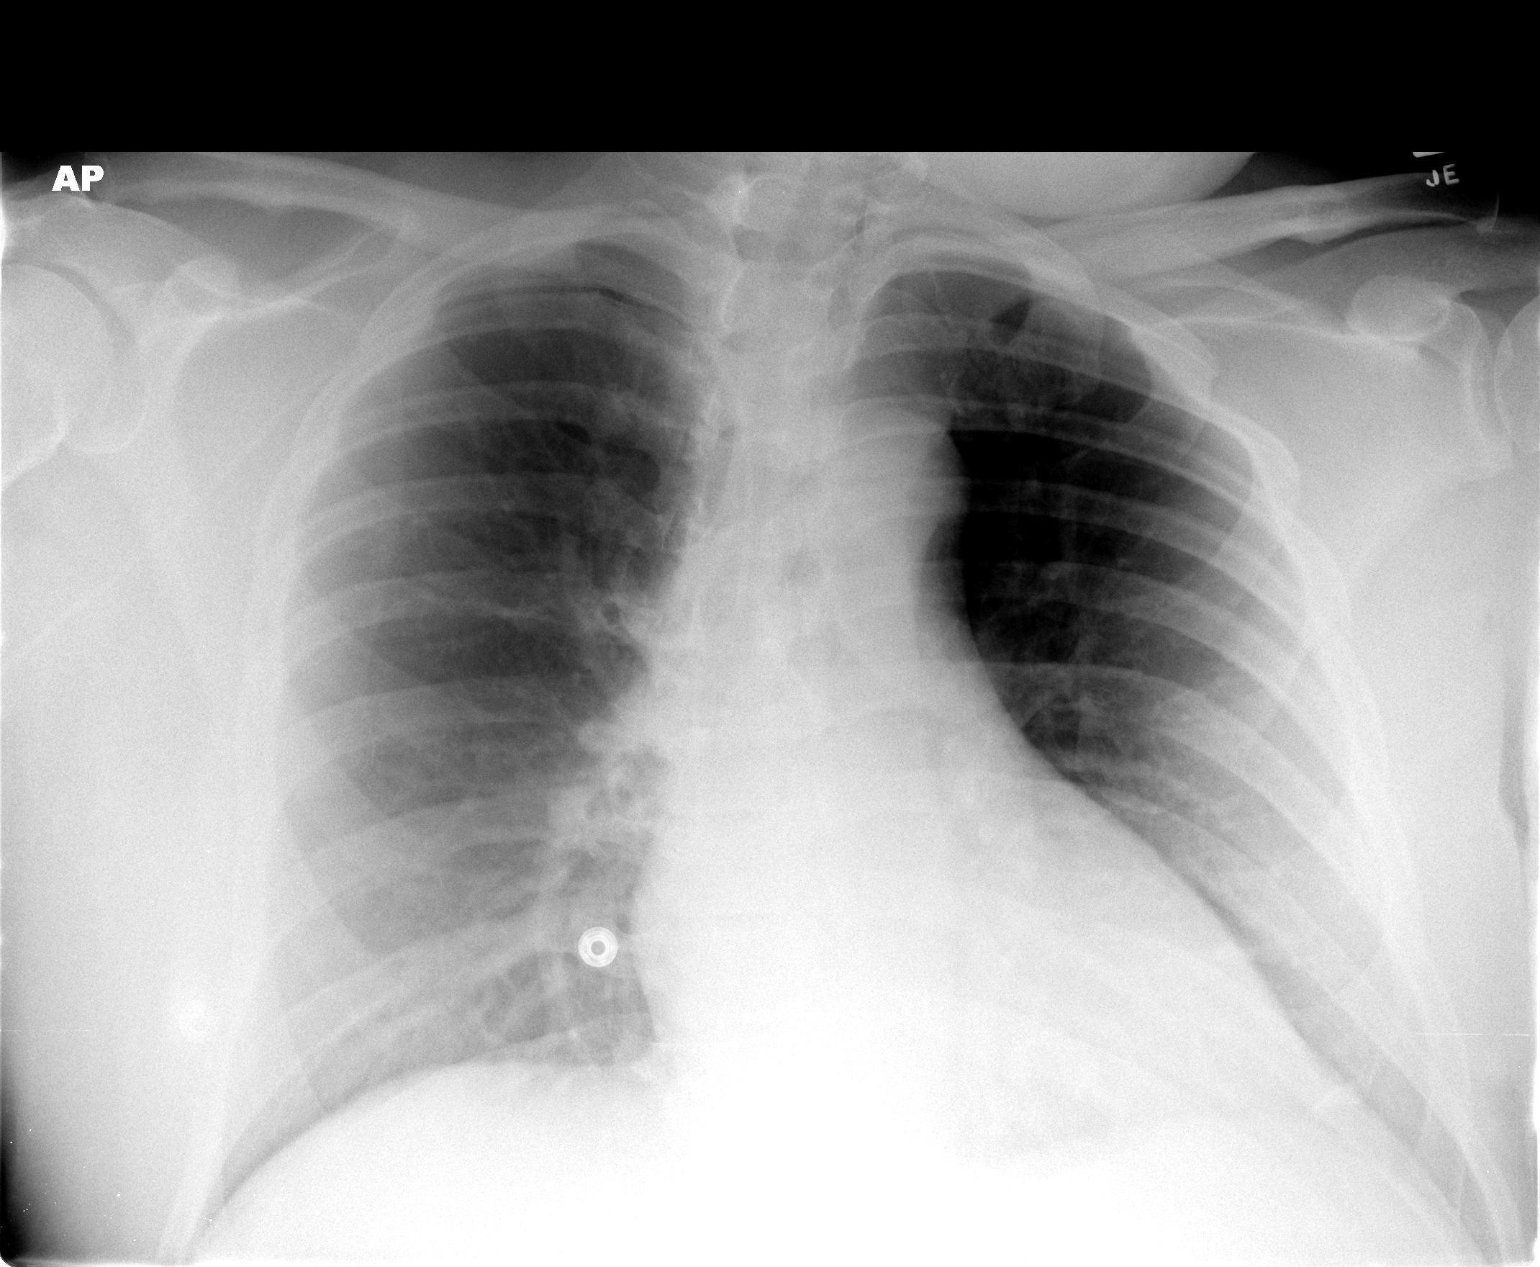

[1 of 1 positions shown; findings below may reference images not displayed]

FINDINGS: Right-sided PICC line tip projects within the superior
vena cava.  No pneumothorax or complicating feature identified.

The lungs appear clear. The patient is rotated to the left on
today's exam, resulting in reduced diagnostic sensitivity and
specificity.
IMPRESSION: 1.  Right PICC line tip:  SVC.

## 2010-06-11 IMAGING — CR DG FINGER THUMB 2+V*L*
3 series · 3 of 3 positions shown · non-contrast
Comparison: 07/09/1999

CLINICAL DATA: Thumb pain and swelling.  Recent surgery.

LEFT THUMB 2+V

[x finger pa left]
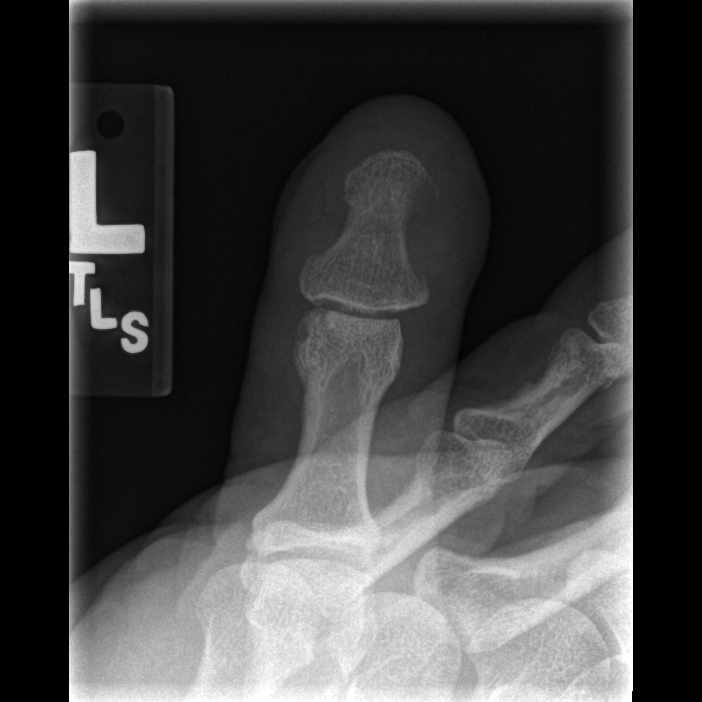

[x finger obl. left]
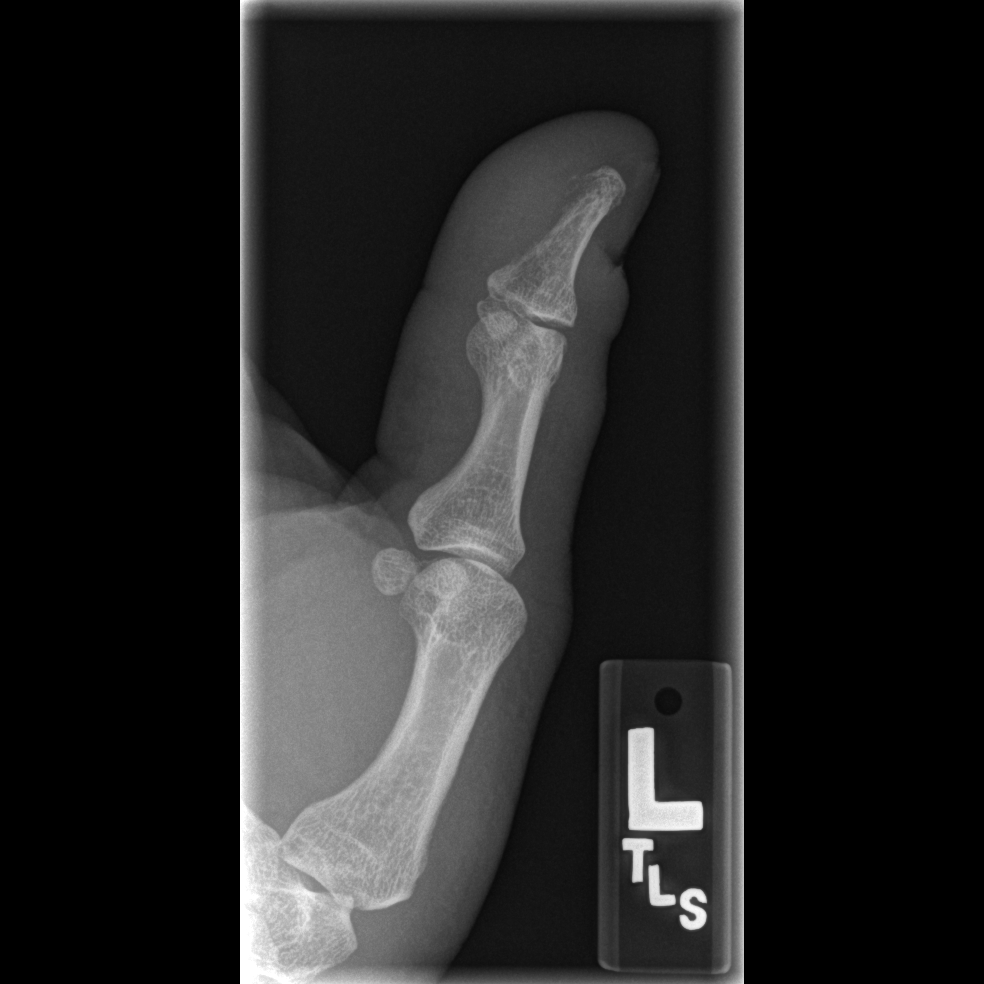

[x finger lateral left]
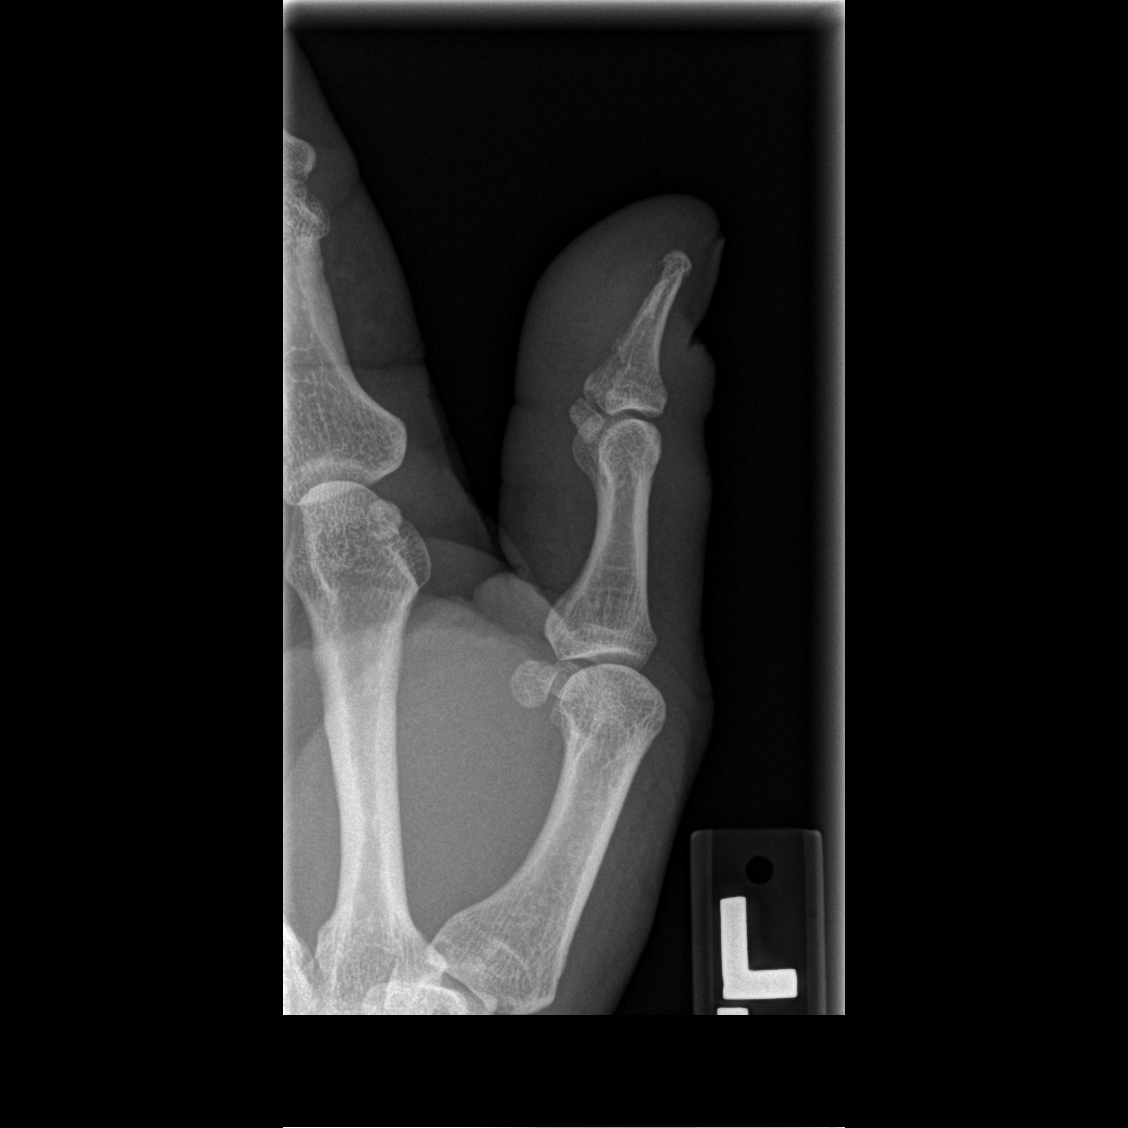

[3 of 3 positions shown; findings below may reference images not displayed]

FINDINGS: Decreased soft tissue swelling is seen as well as
resolution of soft tissue gas.  Bone destruction again seen
involving the ulnar aspect of the tuft of the distal phalanx,
consistent with osteomyelitis.  No other bone abnormality
identified.
IMPRESSION: 1.  Decreased soft tissue swelling and resolution of soft tissue
gas since prior study.
2.  Stable appearance of bone destruction involving the tuft of the
distal phalanx, consistent with osteomyelitis.

## 2010-06-12 ENCOUNTER — Ambulatory Visit: Payer: Medicaid Other | Admitting: Dietician

## 2010-06-16 ENCOUNTER — Observation Stay (HOSPITAL_COMMUNITY)
Admission: EM | Admit: 2010-06-16 | Discharge: 2010-06-16 | Disposition: A | Discharge status: Home / Self Care | Payer: Medicaid Other | Attending: Emergency Medicine | Admitting: Emergency Medicine

## 2010-06-16 DIAGNOSIS — L02519 Cutaneous abscess of unspecified hand: Secondary | ICD-10-CM | POA: Insufficient documentation

## 2010-06-16 DIAGNOSIS — E119 Type 2 diabetes mellitus without complications: Principal | ICD-10-CM | POA: Insufficient documentation

## 2010-06-16 DIAGNOSIS — Z21 Asymptomatic human immunodeficiency virus [HIV] infection status: Secondary | ICD-10-CM | POA: Insufficient documentation

## 2010-06-16 DIAGNOSIS — M109 Gout, unspecified: Secondary | ICD-10-CM | POA: Insufficient documentation

## 2010-06-16 DIAGNOSIS — M545 Low back pain, unspecified: Secondary | ICD-10-CM | POA: Insufficient documentation

## 2010-06-16 DIAGNOSIS — E860 Dehydration: Secondary | ICD-10-CM | POA: Insufficient documentation

## 2010-06-16 DIAGNOSIS — G8929 Other chronic pain: Secondary | ICD-10-CM | POA: Insufficient documentation

## 2010-06-16 DIAGNOSIS — L03019 Cellulitis of unspecified finger: Secondary | ICD-10-CM | POA: Insufficient documentation

## 2010-06-16 DIAGNOSIS — I10 Essential (primary) hypertension: Secondary | ICD-10-CM | POA: Insufficient documentation

## 2010-06-16 DIAGNOSIS — M79609 Pain in unspecified limb: Secondary | ICD-10-CM | POA: Insufficient documentation

## 2010-06-16 LAB — GLUCOSE, CAPILLARY
Glucose-Capillary: >600 mg/dL (ref 70–99)
Glucose-Capillary: 476 mg/dL — ABNORMAL HIGH (ref 70–99)
Glucose-Capillary: 418 mg/dL — ABNORMAL HIGH (ref 70–99)
Glucose-Capillary: 276 mg/dL — ABNORMAL HIGH (ref 70–99)

## 2010-06-16 LAB — DIFFERENTIAL
Basophils Absolute: 0 10*3/uL (ref 0.0–0.1)
Basophils Relative: 0 % (ref 0–1)
Eosinophils Absolute: 0 10*3/uL (ref 0.0–0.7)
Eosinophils Relative: 0 % (ref 0–5)
Lymphocytes Relative: 31 % (ref 12–46)
Lymphs Abs: 2 10*3/uL (ref 0.7–4.0)
Monocytes Absolute: 0.4 10*3/uL (ref 0.1–1.0)
Monocytes Relative: 6 % (ref 3–12)
Neutro Abs: 4 10*3/uL (ref 1.7–7.7)
Neutrophils Relative %: 63 % (ref 43–77)

## 2010-06-16 LAB — COMPREHENSIVE METABOLIC PANEL
ALT: 19 U/L (ref 0–53)
AST: 24 U/L (ref 0–37)
Albumin: 4.1 g/dL (ref 3.5–5.2)
Alkaline Phosphatase: 99 U/L (ref 39–117)
BUN: 19 mg/dL (ref 6–23)
CO2: 24 mEq/L (ref 19–32)
Calcium: 9.7 mg/dL (ref 8.4–10.5)
Chloride: 92 mEq/L — ABNORMAL LOW (ref 96–112)
Creatinine, Ser: 1.07 mg/dL (ref 0.4–1.5)
GFR calc Af Amer: >60 mL/min (ref >60)
GFR calc non Af Amer: >60 mL/min (ref >60)
Glucose, Bld: 662 mg/dL (ref 70–99)
Potassium: 4.8 mEq/L (ref 3.5–5.1)
Sodium: 125 mEq/L — ABNORMAL LOW (ref 135–145)
Total Bilirubin: 0.3 mg/dL (ref 0.3–1.2)
Total Protein: 8.2 g/dL (ref 6.0–8.3)

## 2010-06-16 LAB — POCT I-STAT 3, VENOUS BLOOD GAS (G3P V)
Acid-Base Excess: 1 mmol/L (ref 0.0–2.0)
Bicarbonate: 27.3 mEq/L — ABNORMAL HIGH (ref 20.0–24.0)
O2 Saturation: 63 %
Patient temperature: 98.4
TCO2: 29 mmol/L (ref 0–100)
pCO2, Ven: 45.7 mmHg (ref 45.0–50.0)
pH, Ven: 7.384 — ABNORMAL HIGH (ref 7.250–7.300)
pO2, Ven: 33 mmHg (ref 30.0–45.0)

## 2010-06-16 LAB — URINALYSIS, ROUTINE W REFLEX MICROSCOPIC
Bilirubin Urine: NEGATIVE
Glucose, UA: >1000 mg/dL — AB
Hgb urine dipstick: NEGATIVE
Ketones, ur: NEGATIVE mg/dL
Leukocytes, UA: NEGATIVE
Nitrite: NEGATIVE
Protein, ur: NEGATIVE mg/dL
Specific Gravity, Urine: 1.028 (ref 1.005–1.030)
Urobilinogen, UA: 0.2 mg/dL (ref 0.0–1.0)
pH: 5.5 (ref 5.0–8.0)

## 2010-06-16 LAB — CBC
HCT: 39.3 % (ref 39.0–52.0)
Hemoglobin: 13.5 g/dL (ref 13.0–17.0)
MCH: 27.1 pg (ref 26.0–34.0)
MCHC: 34.4 g/dL (ref 30.0–36.0)
MCV: 78.8 fL (ref 78.0–100.0)
Platelets: 179 10*3/uL (ref 150–400)
RBC: 4.99 MIL/uL (ref 4.22–5.81)
RDW: 13.3 % (ref 11.5–15.5)
WBC: 6.4 10*3/uL (ref 4.0–10.5)

## 2010-06-16 LAB — URINE MICROSCOPIC-ADD ON

## 2010-06-18 LAB — HEMOGLOBIN A1C
Hgb A1c MFr Bld: 17 % — ABNORMAL HIGH (ref <5.7)
Hgb A1c MFr Bld: 17.6 % — ABNORMAL HIGH (ref <5.7)
Mean Plasma Glucose: 441 mg/dL — ABNORMAL HIGH (ref <117)
Mean Plasma Glucose: 458 mg/dL — ABNORMAL HIGH (ref <117)

## 2010-06-18 LAB — CBC
HCT: 38.4 % — ABNORMAL LOW (ref 39.0–52.0)
HCT: 38.7 % — ABNORMAL LOW (ref 39.0–52.0)
Hemoglobin: 12.9 g/dL — ABNORMAL LOW (ref 13.0–17.0)
Hemoglobin: 13 g/dL (ref 13.0–17.0)
MCH: 26.5 pg (ref 26.0–34.0)
MCH: 26.8 pg (ref 26.0–34.0)
MCHC: 33.3 g/dL (ref 30.0–36.0)
MCHC: 33.9 g/dL (ref 30.0–36.0)
MCV: 78.2 fL (ref 78.0–100.0)
MCV: 80.3 fL (ref 78.0–100.0)
Platelets: 189 10*3/uL (ref 150–400)
Platelets: 203 10*3/uL (ref 150–400)
RBC: 4.82 MIL/uL (ref 4.22–5.81)
RBC: 4.91 MIL/uL (ref 4.22–5.81)
RDW: 13.3 % (ref 11.5–15.5)
RDW: 13.3 % (ref 11.5–15.5)
WBC: 4 10*3/uL (ref 4.0–10.5)
WBC: 5.6 10*3/uL (ref 4.0–10.5)

## 2010-06-18 LAB — GLUCOSE, CAPILLARY
Glucose-Capillary: 150 mg/dL — ABNORMAL HIGH (ref 70–99)
Glucose-Capillary: 177 mg/dL — ABNORMAL HIGH (ref 70–99)
Glucose-Capillary: 180 mg/dL — ABNORMAL HIGH (ref 70–99)
Glucose-Capillary: 207 mg/dL — ABNORMAL HIGH (ref 70–99)
Glucose-Capillary: 226 mg/dL — ABNORMAL HIGH (ref 70–99)
Glucose-Capillary: 238 mg/dL — ABNORMAL HIGH (ref 70–99)
Glucose-Capillary: 239 mg/dL — ABNORMAL HIGH (ref 70–99)
Glucose-Capillary: 244 mg/dL — ABNORMAL HIGH (ref 70–99)
Glucose-Capillary: 262 mg/dL — ABNORMAL HIGH (ref 70–99)
Glucose-Capillary: 360 mg/dL — ABNORMAL HIGH (ref 70–99)
Glucose-Capillary: 379 mg/dL — ABNORMAL HIGH (ref 70–99)
Glucose-Capillary: 446 mg/dL — ABNORMAL HIGH (ref 70–99)
Glucose-Capillary: 587 mg/dL (ref 70–99)
Glucose-Capillary: 89 mg/dL (ref 70–99)
Glucose-Capillary: >600 mg/dL (ref 70–99)

## 2010-06-18 LAB — DIFFERENTIAL
Basophils Absolute: 0 10*3/uL (ref 0.0–0.1)
Basophils Relative: 0 % (ref 0–1)
Eosinophils Absolute: 0 10*3/uL (ref 0.0–0.7)
Eosinophils Relative: 1 % (ref 0–5)
Lymphocytes Relative: 43 % (ref 12–46)
Lymphs Abs: 2.4 10*3/uL (ref 0.7–4.0)
Monocytes Absolute: 0.3 10*3/uL (ref 0.1–1.0)
Monocytes Relative: 5 % (ref 3–12)
Neutro Abs: 2.9 10*3/uL (ref 1.7–7.7)
Neutrophils Relative %: 52 % (ref 43–77)

## 2010-06-18 LAB — POCT I-STAT 3, ART BLOOD GAS (G3+)
Acid-base deficit: 1 mmol/L (ref 0.0–2.0)
Bicarbonate: 24.2 mEq/L — ABNORMAL HIGH (ref 20.0–24.0)
O2 Saturation: 96 %
Patient temperature: 98.6
TCO2: 26 mmol/L (ref 0–100)
pCO2 arterial: 42.9 mmHg (ref 35.0–45.0)
pH, Arterial: 7.36 (ref 7.350–7.450)
pO2, Arterial: 84 mmHg (ref 80.0–100.0)

## 2010-06-18 LAB — COMPREHENSIVE METABOLIC PANEL
ALT: 17 U/L (ref 0–53)
AST: 21 U/L (ref 0–37)
Albumin: 3.8 g/dL (ref 3.5–5.2)
Alkaline Phosphatase: 104 U/L (ref 39–117)
BUN: 15 mg/dL (ref 6–23)
CO2: 26 mEq/L (ref 19–32)
Calcium: 9.2 mg/dL (ref 8.4–10.5)
Chloride: 90 mEq/L — ABNORMAL LOW (ref 96–112)
Creatinine, Ser: 1.07 mg/dL (ref 0.4–1.5)
GFR calc Af Amer: >60 mL/min (ref >60)
GFR calc non Af Amer: >60 mL/min (ref >60)
Glucose, Bld: 774 mg/dL (ref 70–99)
Potassium: 4.5 mEq/L (ref 3.5–5.1)
Sodium: 124 mEq/L — ABNORMAL LOW (ref 135–145)
Total Bilirubin: 1.9 mg/dL — ABNORMAL HIGH (ref 0.3–1.2)
Total Protein: 8 g/dL (ref 6.0–8.3)

## 2010-06-18 LAB — URINALYSIS, ROUTINE W REFLEX MICROSCOPIC
Bilirubin Urine: NEGATIVE
Glucose, UA: >1000 mg/dL — AB
Hgb urine dipstick: NEGATIVE
Ketones, ur: NEGATIVE mg/dL
Leukocytes, UA: NEGATIVE
Nitrite: NEGATIVE
Protein, ur: NEGATIVE mg/dL
Specific Gravity, Urine: 1.028 (ref 1.005–1.030)
Urobilinogen, UA: 0.2 mg/dL (ref 0.0–1.0)
pH: 6.5 (ref 5.0–8.0)

## 2010-06-18 LAB — BASIC METABOLIC PANEL
BUN: 10 mg/dL (ref 6–23)
CO2: 31 mEq/L (ref 19–32)
Calcium: 9 mg/dL (ref 8.4–10.5)
Chloride: 101 mEq/L (ref 96–112)
Creatinine, Ser: 0.81 mg/dL (ref 0.4–1.5)
GFR calc Af Amer: >60 mL/min (ref >60)
GFR calc non Af Amer: >60 mL/min (ref >60)
Glucose, Bld: 128 mg/dL — ABNORMAL HIGH (ref 70–99)
Potassium: 3.7 mEq/L (ref 3.5–5.1)
Sodium: 137 mEq/L (ref 135–145)

## 2010-06-18 LAB — TSH: TSH: 2.489 u[IU]/mL (ref 0.350–4.500)

## 2010-06-18 LAB — KETONES, QUALITATIVE: Acetone, Bld: NEGATIVE

## 2010-06-18 LAB — LIPID PANEL
Cholesterol: 251 mg/dL — ABNORMAL HIGH (ref 0–200)
HDL: 34 mg/dL — ABNORMAL LOW (ref >39)
LDL Cholesterol: 157 mg/dL — ABNORMAL HIGH (ref 0–99)
Total CHOL/HDL Ratio: 7.4 RATIO
Triglycerides: 300 mg/dL — ABNORMAL HIGH (ref <150)
VLDL: 60 mg/dL — ABNORMAL HIGH (ref 0–40)

## 2010-06-18 LAB — URINE MICROSCOPIC-ADD ON

## 2010-06-18 LAB — MAGNESIUM: Magnesium: 1.9 mg/dL (ref 1.5–2.5)

## 2010-06-19 IMAGING — CR DG FINGER THUMB 2+V*L*
3 series · 3 of 3 positions shown · non-contrast
Comparison: Radiographs dated 07/15/2009

CLINICAL DATA: Infection of the thumb.

LEFT THUMB 2+V

[x finger pa left]
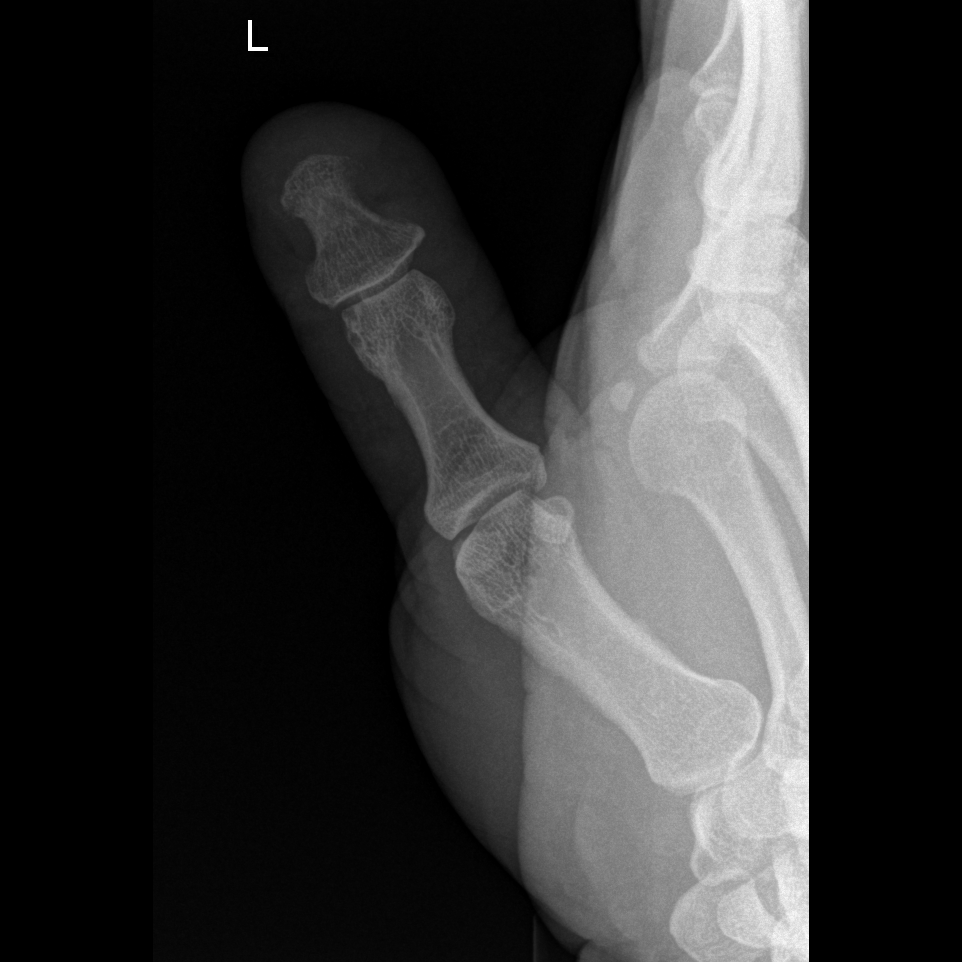

[x finger obl. left]
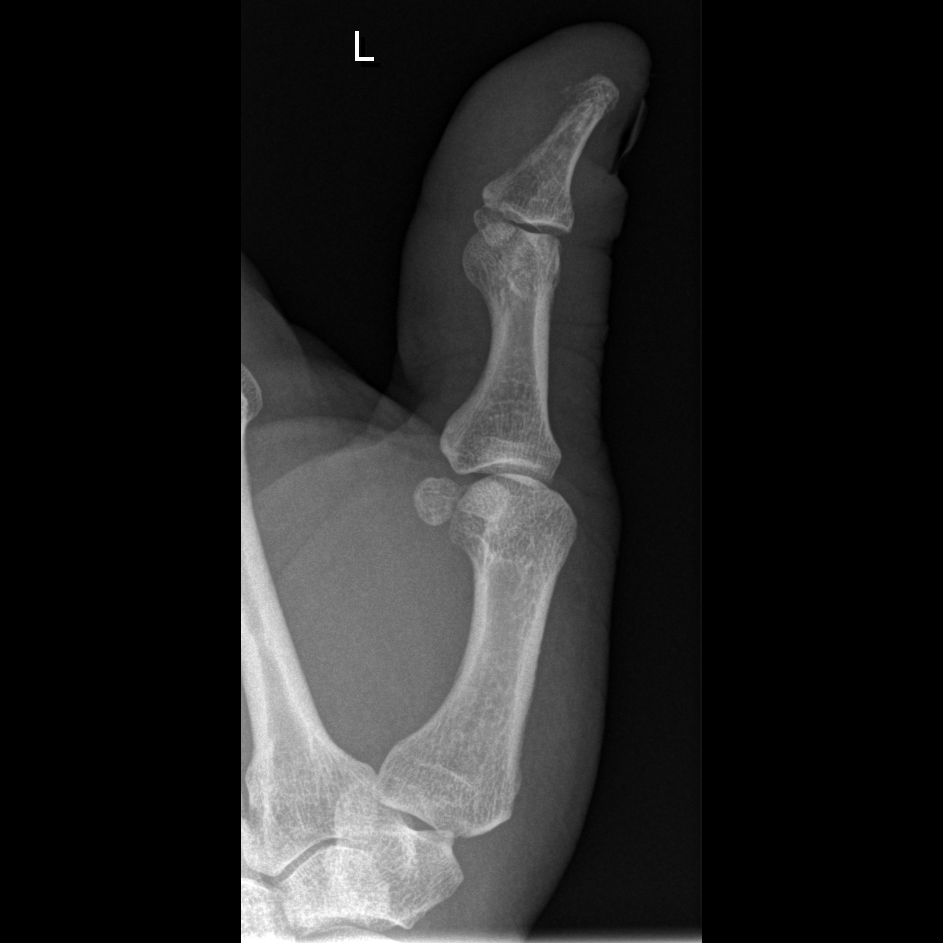

[x finger lateral left]
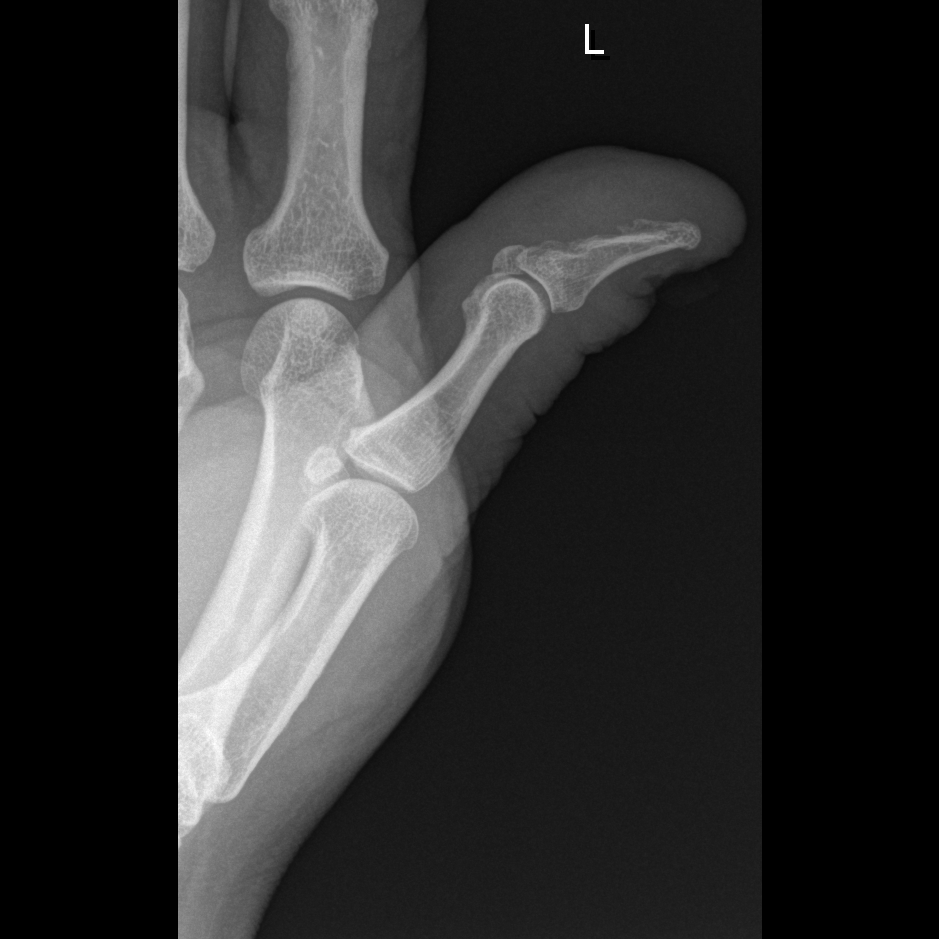

[3 of 3 positions shown; findings below may reference images not displayed]

FINDINGS: The subtle destruction of the tuft of the distal
phalangeal bone is unchanged since the prior study.  Soft tissue
swelling is unchanged.  No other abnormality.
IMPRESSION: No change since the prior study.  Destruction of a portion of the
tuft of the distal phalangeal bone is consistent with
osteomyelitis.

## 2010-06-20 LAB — DIFFERENTIAL
Basophils Absolute: 0 10*3/uL (ref 0.0–0.1)
Basophils Relative: 0 % (ref 0–1)
Eosinophils Absolute: 0 10*3/uL (ref 0.0–0.7)
Eosinophils Relative: 0 % (ref 0–5)
Lymphocytes Relative: 31 % (ref 12–46)
Lymphs Abs: 2.1 10*3/uL (ref 0.7–4.0)
Monocytes Absolute: 0.4 10*3/uL (ref 0.1–1.0)
Monocytes Relative: 6 % (ref 3–12)
Neutro Abs: 4.2 10*3/uL (ref 1.7–7.7)
Neutrophils Relative %: 63 % (ref 43–77)

## 2010-06-20 LAB — GLUCOSE, CAPILLARY
Glucose-Capillary: 268 mg/dL — ABNORMAL HIGH (ref 70–99)
Glucose-Capillary: 301 mg/dL — ABNORMAL HIGH (ref 70–99)
Glucose-Capillary: 359 mg/dL — ABNORMAL HIGH (ref 70–99)
Glucose-Capillary: 405 mg/dL — ABNORMAL HIGH (ref 70–99)
Glucose-Capillary: 432 mg/dL — ABNORMAL HIGH (ref 70–99)
Glucose-Capillary: 531 mg/dL — ABNORMAL HIGH (ref 70–99)

## 2010-06-20 LAB — POCT I-STAT 3, VENOUS BLOOD GAS (G3P V)
Bicarbonate: 25.1 mEq/L — ABNORMAL HIGH (ref 20.0–24.0)
O2 Saturation: 90 %
TCO2: 26 mmol/L (ref 0–100)
pCO2, Ven: 40.7 mmHg — ABNORMAL LOW (ref 45.0–50.0)
pH, Ven: 7.399 — ABNORMAL HIGH (ref 7.250–7.300)
pO2, Ven: 58 mmHg — ABNORMAL HIGH (ref 30.0–45.0)

## 2010-06-20 LAB — URINALYSIS, ROUTINE W REFLEX MICROSCOPIC
Bilirubin Urine: NEGATIVE
Glucose, UA: >1000 mg/dL — AB
Hgb urine dipstick: NEGATIVE
Ketones, ur: NEGATIVE mg/dL
Leukocytes, UA: NEGATIVE
Nitrite: NEGATIVE
Protein, ur: NEGATIVE mg/dL
Specific Gravity, Urine: 1.01 (ref 1.005–1.030)
Urobilinogen, UA: 0.2 mg/dL (ref 0.0–1.0)
pH: 6 (ref 5.0–8.0)

## 2010-06-20 LAB — URINE MICROSCOPIC-ADD ON

## 2010-06-20 LAB — CBC
HCT: 39.2 % (ref 39.0–52.0)
Hemoglobin: 13.3 g/dL (ref 13.0–17.0)
MCH: 27 pg (ref 26.0–34.0)
MCHC: 33.9 g/dL (ref 30.0–36.0)
MCV: 79.7 fL (ref 78.0–100.0)
Platelets: 216 10*3/uL (ref 150–400)
RBC: 4.92 MIL/uL (ref 4.22–5.81)
RDW: 13.6 % (ref 11.5–15.5)
WBC: 6.7 10*3/uL (ref 4.0–10.5)

## 2010-06-20 LAB — BASIC METABOLIC PANEL
BUN: 13 mg/dL (ref 6–23)
CO2: 25 mEq/L (ref 19–32)
Calcium: 9.7 mg/dL (ref 8.4–10.5)
Chloride: 99 mEq/L (ref 96–112)
Creatinine, Ser: 0.84 mg/dL (ref 0.4–1.5)
GFR calc Af Amer: >60 mL/min (ref >60)
GFR calc non Af Amer: >60 mL/min (ref >60)
Glucose, Bld: 517 mg/dL — ABNORMAL HIGH (ref 70–99)
Potassium: 3.9 mEq/L (ref 3.5–5.1)
Sodium: 132 mEq/L — ABNORMAL LOW (ref 135–145)

## 2010-06-20 IMAGING — MR MR [PERSON_NAME] UP JT WO/W CM*L*
5 of 9 series · 20 of 40 positions shown · IV contrast (multihance)
Comparison: Plain films 07/23/2009.

CLINICAL DATA: Osteomyelitis of the left film.  Status post
incision and drainage.

MRI LEFT WITH AND WITHOUT CONTRAST
TECHNIQUE: Multiplanar, multisequence MR imaging of the left was
performed before and after the administration of intravenous
contrast.
Contrast: 20 ml Multihance IV.

[Series 3: T2 fat-sat · axial · 3.0mm · 0.20mm/px · z∈[-52,+43]mm · 7 of 32 slices shown (1 of 2)]
[im 1/32]
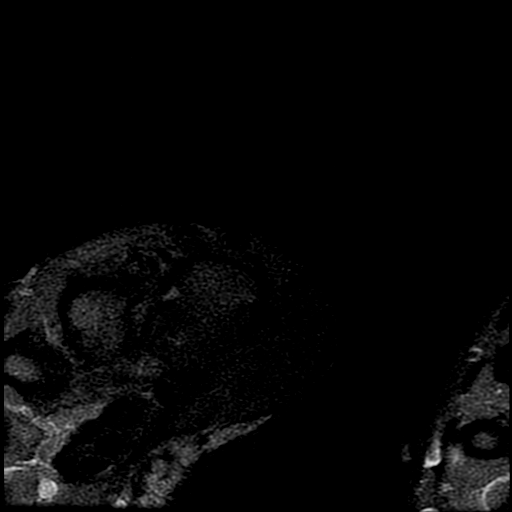
[im 6/32]
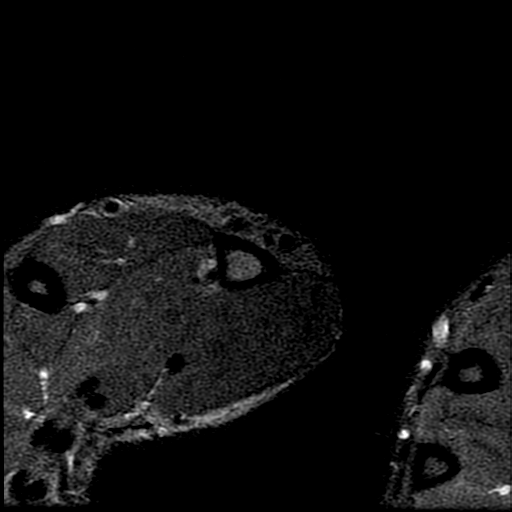
[im 11/32]
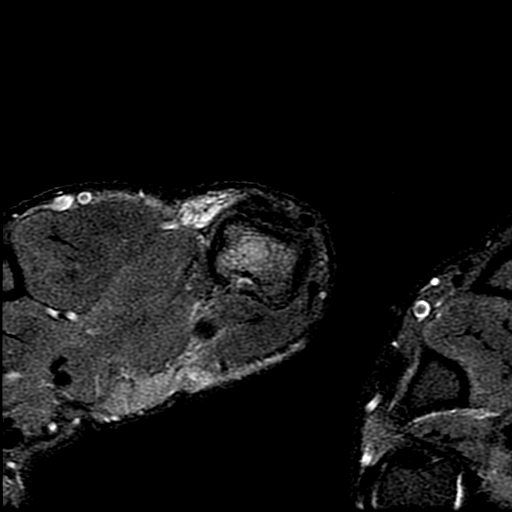
[im 16/32]
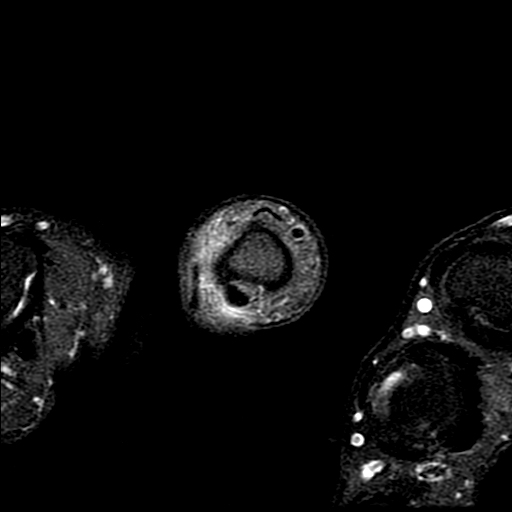
[im 21/32]
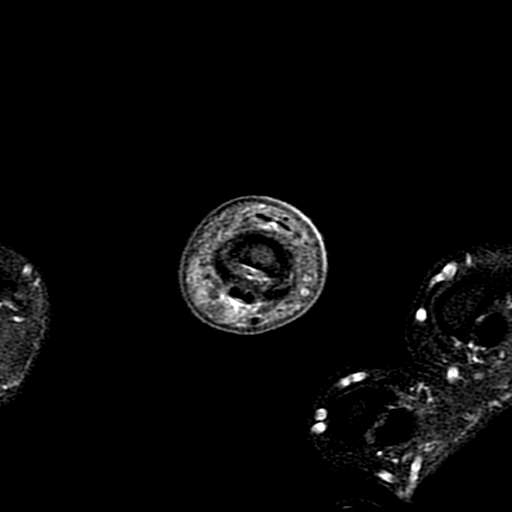
[im 26/32]
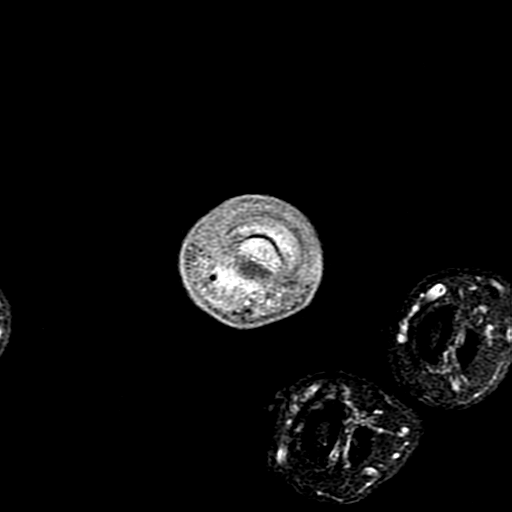
[im 32/32]
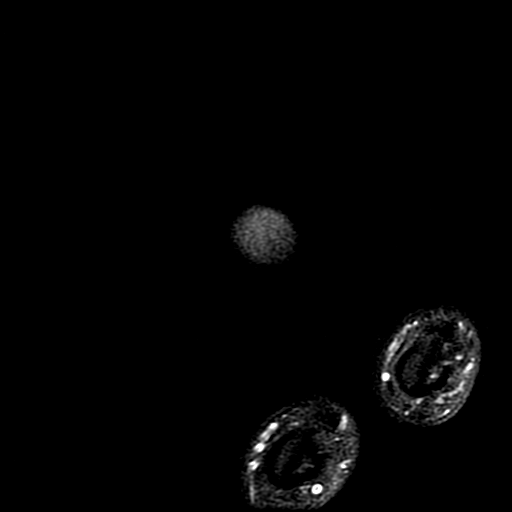

[Series 3: T1 post-contrast · axial · 3.0mm · 0.23mm/px · z∈[-50,+50]mm · 7 of 30 slices shown (1 of 2)]
[im 1/30]
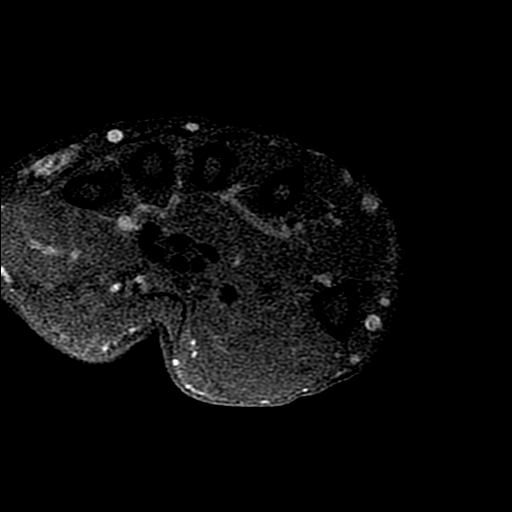
[im 5/30]
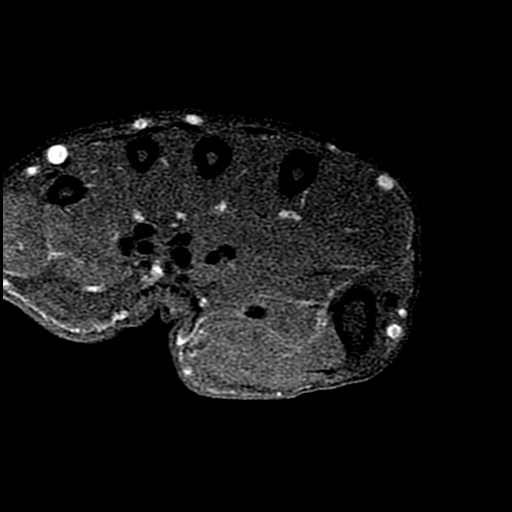
[im 10/30]
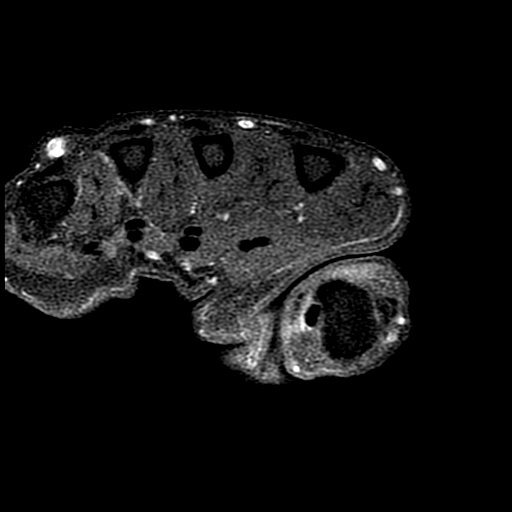
[im 15/30]
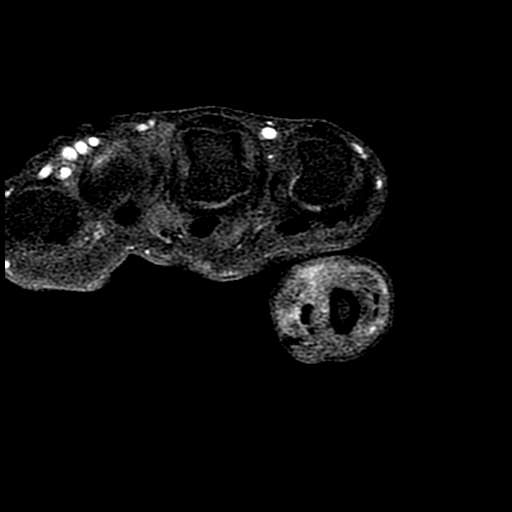
[im 20/30]
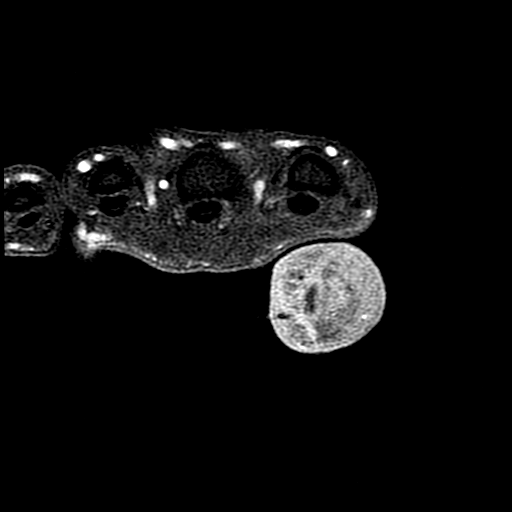
[im 25/30]
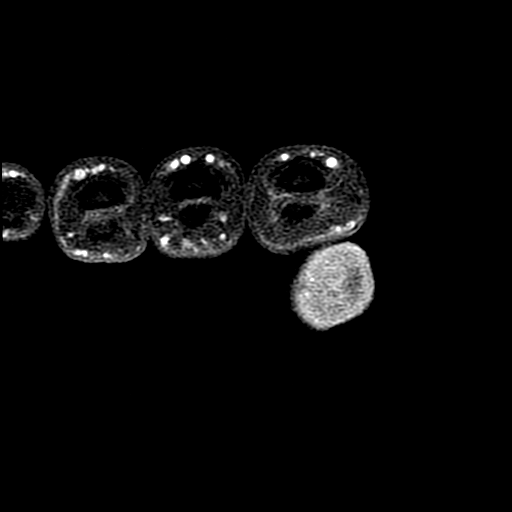
[im 30/30]
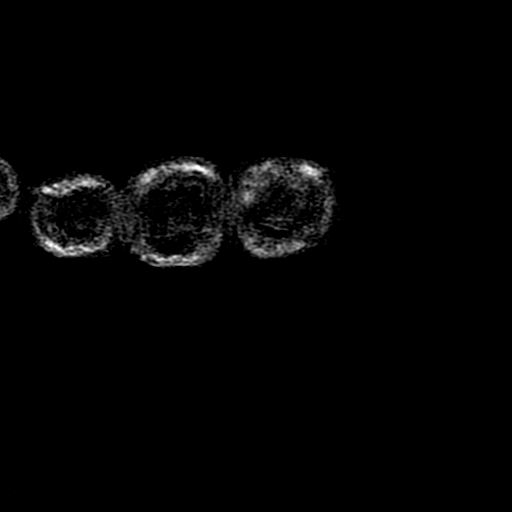

[Series 4: T1 post-contrast · coronal · 3.0mm · 0.23mm/px · 2 of 12 slices shown (2 of 2)]
[im 1/12]
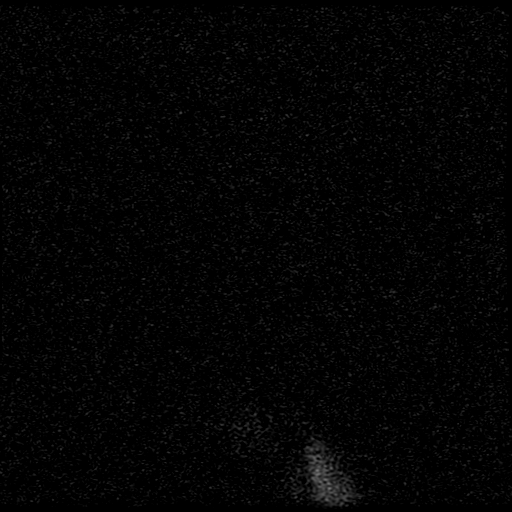
[im 12/12]
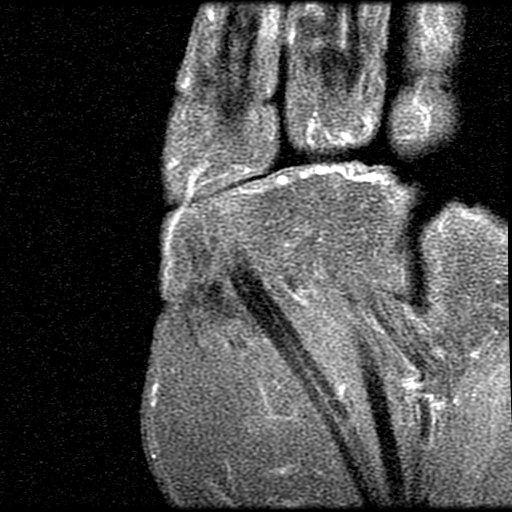

[Series 4: T2 fat-sat · coronal · 3.0mm · 0.23mm/px · 2 of 13 slices shown (2 of 2)]
[im 1/13]
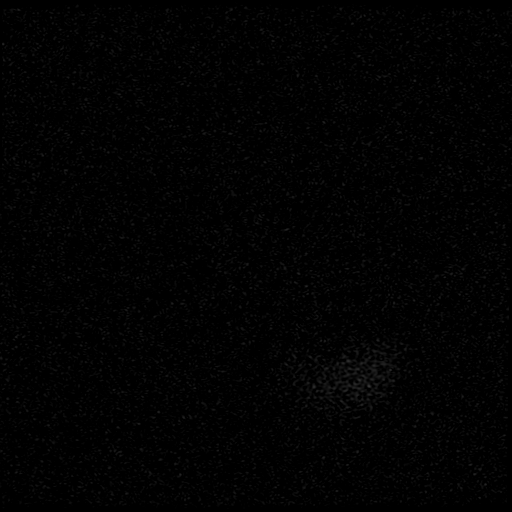
[im 13/13]
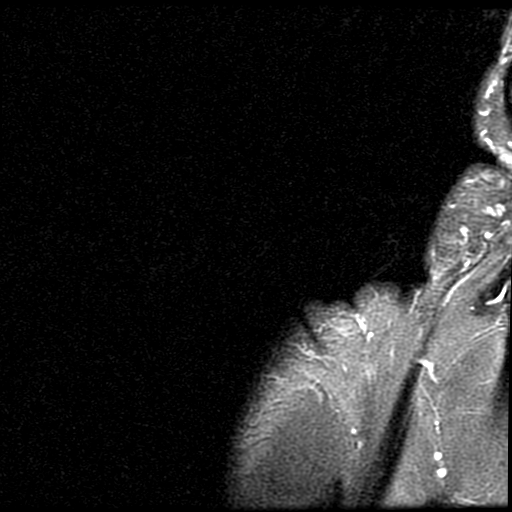

[Series 7: PD fat-sat · coronal · 3.0mm · 0.23mm/px · 2 of 13 slices shown]
[im 1/13]
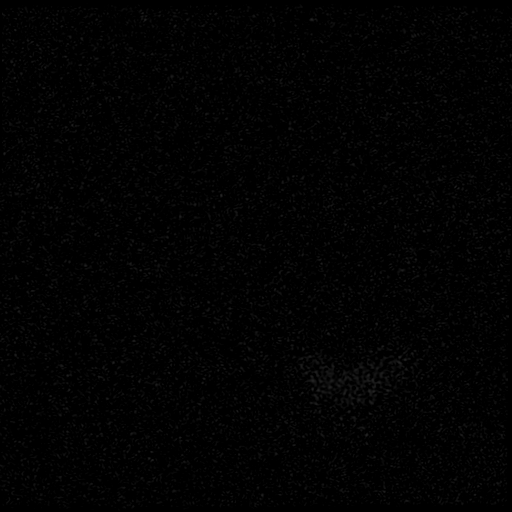
[im 13/13]
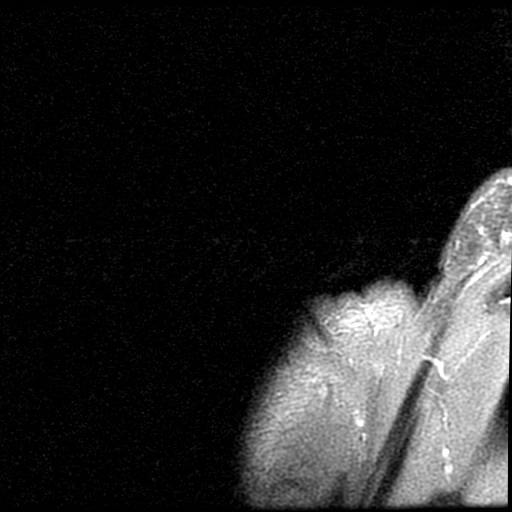

[20 of 40 positions shown; findings below may reference images not displayed]

FINDINGS: There is edema and enhancement throughout the distal
phalanx of the thumb consistent with osteomyelitis.  There is no
abscess.  Soft tissue edema and enhancement are consistent with
cellulitis.  The study is otherwise negative.
IMPRESSION: Osteomyelitis of the distal phalanx of the thumb.  No abscess.

## 2010-06-20 IMAGING — US IR FLUORO GUIDE CV LINE*R*
1 series · 1 of 1 positions shown · non-contrast
Comparison: none

07/25/2009 - DUPLICATE COPY for exam association in [facility] – No change from original report.
CLINICAL DATA: Left thumb infection; IV antibiotics

 UPPER EXTREMITY PICC PLACEMENT WITH ULTRASOUND AND FLUORO GUIDANCE
TECHNIQUE: The right arm was prepped with chlorhexidine, draped in
 the usual sterile fashion using maximum barrier technique and
 infiltrated locally with 1% Lidocaine. Ultrasound demonstrated
 patency of the right brachial vein, and this was documented with an
 image. Under real-time ultrasound guidance, this vein was accessed
 with a 21 gauge micropuncture needle and image documentation was
 performed. The needle was exchanged over a guidewire for a peel-
 away sheath through which a five French double lumen PICC trimmed
 to 41cm was advanced, positioned with its tip at the lower
 SVC/right atrial junction. Fluoroscopy during the procedure and
 fluoro spot radiograph confirms appropriate catheter position. The
 catheter was flushed, secured to the skin with Prolene sutures, and
 covered with a sterile dressing. No immediate complication.
 Fluoroscopy Time: 0.5 minutes.

[Series 1: ir fluoro guide cv line*right* · 1 of 1 slices shown]
[im 1/1]
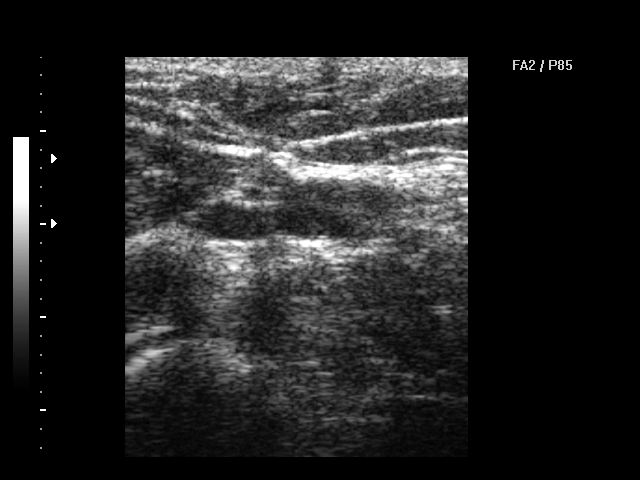

[1 of 1 positions shown; findings below may reference images not displayed]

IMPRESSION: Technically successful right arm PICC placement with ultrasound and
 fluoroscopic guidance. The catheter is ready for use.

 Read by: Champion, Devontae.-AMNON

## 2010-06-21 LAB — URINALYSIS, ROUTINE W REFLEX MICROSCOPIC
Bilirubin Urine: NEGATIVE
Bilirubin Urine: NEGATIVE
Glucose, UA: >1000 mg/dL — AB
Glucose, UA: >1000 mg/dL — AB
Hgb urine dipstick: NEGATIVE
Hgb urine dipstick: NEGATIVE
Ketones, ur: NEGATIVE mg/dL
Ketones, ur: NEGATIVE mg/dL
Leukocytes, UA: NEGATIVE
Leukocytes, UA: NEGATIVE
Nitrite: NEGATIVE
Nitrite: NEGATIVE
Protein, ur: NEGATIVE mg/dL
Protein, ur: NEGATIVE mg/dL
Specific Gravity, Urine: 1.028 (ref 1.005–1.030)
Specific Gravity, Urine: 1.036 — ABNORMAL HIGH (ref 1.005–1.030)
Urobilinogen, UA: 0.2 mg/dL (ref 0.0–1.0)
Urobilinogen, UA: 0.2 mg/dL (ref 0.0–1.0)
pH: 5.5 (ref 5.0–8.0)
pH: 6 (ref 5.0–8.0)

## 2010-06-21 LAB — COMPREHENSIVE METABOLIC PANEL
ALT: 16 U/L (ref 0–53)
ALT: 16 U/L (ref 0–53)
AST: 21 U/L (ref 0–37)
AST: 28 U/L (ref 0–37)
Albumin: 3.7 g/dL (ref 3.5–5.2)
Albumin: 4 g/dL (ref 3.5–5.2)
Alkaline Phosphatase: 76 U/L (ref 39–117)
Alkaline Phosphatase: 81 U/L (ref 39–117)
BUN: 11 mg/dL (ref 6–23)
BUN: 9 mg/dL (ref 6–23)
CO2: 26 mEq/L (ref 19–32)
CO2: 28 mEq/L (ref 19–32)
Calcium: 8.9 mg/dL (ref 8.4–10.5)
Calcium: 9.9 mg/dL (ref 8.4–10.5)
Chloride: 94 mEq/L — ABNORMAL LOW (ref 96–112)
Chloride: 95 mEq/L — ABNORMAL LOW (ref 96–112)
Creatinine, Ser: 0.75 mg/dL (ref 0.4–1.5)
Creatinine, Ser: 0.81 mg/dL (ref 0.4–1.5)
GFR calc Af Amer: >60 mL/min (ref >60)
GFR calc Af Amer: >60 mL/min (ref >60)
GFR calc non Af Amer: >60 mL/min (ref >60)
GFR calc non Af Amer: >60 mL/min (ref >60)
Glucose, Bld: 261 mg/dL — ABNORMAL HIGH (ref 70–99)
Glucose, Bld: 549 mg/dL — ABNORMAL HIGH (ref 70–99)
Potassium: 3.1 mEq/L — ABNORMAL LOW (ref 3.5–5.1)
Potassium: 4.5 mEq/L (ref 3.5–5.1)
Sodium: 129 mEq/L — ABNORMAL LOW (ref 135–145)
Sodium: 131 mEq/L — ABNORMAL LOW (ref 135–145)
Total Bilirubin: 0.5 mg/dL (ref 0.3–1.2)
Total Bilirubin: 0.6 mg/dL (ref 0.3–1.2)
Total Protein: 7.7 g/dL (ref 6.0–8.3)
Total Protein: 8.2 g/dL (ref 6.0–8.3)

## 2010-06-21 LAB — CBC
HCT: 36.2 % — ABNORMAL LOW (ref 39.0–52.0)
HCT: 36.3 % — ABNORMAL LOW (ref 39.0–52.0)
HCT: 40.7 % (ref 39.0–52.0)
HCT: 41.5 % (ref 39.0–52.0)
Hemoglobin: 12.6 g/dL — ABNORMAL LOW (ref 13.0–17.0)
Hemoglobin: 12.7 g/dL — ABNORMAL LOW (ref 13.0–17.0)
Hemoglobin: 13.6 g/dL (ref 13.0–17.0)
Hemoglobin: 13.7 g/dL (ref 13.0–17.0)
MCH: 26.7 pg (ref 26.0–34.0)
MCH: 26.8 pg (ref 26.0–34.0)
MCH: 27 pg (ref 26.0–34.0)
MCH: 27.2 pg (ref 26.0–34.0)
MCHC: 32.8 g/dL (ref 30.0–36.0)
MCHC: 33.7 g/dL (ref 30.0–36.0)
MCHC: 34.7 g/dL (ref 30.0–36.0)
MCHC: 35.1 g/dL (ref 30.0–36.0)
MCV: 76.4 fL — ABNORMAL LOW (ref 78.0–100.0)
MCV: 77.9 fL — ABNORMAL LOW (ref 78.0–100.0)
MCV: 80.9 fL (ref 78.0–100.0)
MCV: 81.5 fL (ref 78.0–100.0)
Platelets: 150 10*3/uL (ref 150–400)
Platelets: 155 10*3/uL (ref 150–400)
Platelets: 160 10*3/uL (ref 150–400)
Platelets: 164 10*3/uL (ref 150–400)
RBC: 4.66 MIL/uL (ref 4.22–5.81)
RBC: 4.74 MIL/uL (ref 4.22–5.81)
RBC: 5.03 MIL/uL (ref 4.22–5.81)
RBC: 5.09 MIL/uL (ref 4.22–5.81)
RDW: 12.3 % (ref 11.5–15.5)
RDW: 12.6 % (ref 11.5–15.5)
RDW: 13 % (ref 11.5–15.5)
RDW: 13 % (ref 11.5–15.5)
WBC: 4.2 10*3/uL (ref 4.0–10.5)
WBC: 4.9 10*3/uL (ref 4.0–10.5)
WBC: 4.9 10*3/uL (ref 4.0–10.5)
WBC: 6.6 10*3/uL (ref 4.0–10.5)

## 2010-06-21 LAB — POCT I-STAT 3, VENOUS BLOOD GAS (G3P V)
Acid-Base Excess: 4 mmol/L — ABNORMAL HIGH (ref 0.0–2.0)
Bicarbonate: 31.7 mEq/L — ABNORMAL HIGH (ref 20.0–24.0)
O2 Saturation: 17 %
Patient temperature: 96.8
TCO2: 33 mmol/L (ref 0–100)
pCO2, Ven: 54.2 mmHg — ABNORMAL HIGH (ref 45.0–50.0)
pH, Ven: 7.371 — ABNORMAL HIGH (ref 7.250–7.300)
pO2, Ven: 14 mmHg — CL (ref 30.0–45.0)

## 2010-06-21 LAB — DIFFERENTIAL
Basophils Absolute: 0 10*3/uL (ref 0.0–0.1)
Basophils Absolute: 0 10*3/uL (ref 0.0–0.1)
Basophils Relative: 0 % (ref 0–1)
Basophils Relative: 0 % (ref 0–1)
Eosinophils Absolute: 0 10*3/uL (ref 0.0–0.7)
Eosinophils Absolute: 0 10*3/uL (ref 0.0–0.7)
Eosinophils Relative: 0 % (ref 0–5)
Eosinophils Relative: 1 % (ref 0–5)
Lymphocytes Relative: 36 % (ref 12–46)
Lymphocytes Relative: 39 % (ref 12–46)
Lymphs Abs: 1.8 10*3/uL (ref 0.7–4.0)
Lymphs Abs: 1.9 10*3/uL (ref 0.7–4.0)
Monocytes Absolute: 0.3 10*3/uL (ref 0.1–1.0)
Monocytes Absolute: 0.4 10*3/uL (ref 0.1–1.0)
Monocytes Relative: 7 % (ref 3–12)
Monocytes Relative: 7 % (ref 3–12)
Neutro Abs: 2.6 10*3/uL (ref 1.7–7.7)
Neutro Abs: 2.8 10*3/uL (ref 1.7–7.7)
Neutrophils Relative %: 53 % (ref 43–77)
Neutrophils Relative %: 56 % (ref 43–77)

## 2010-06-21 LAB — BASIC METABOLIC PANEL
BUN: 14 mg/dL (ref 6–23)
BUN: 15 mg/dL (ref 6–23)
BUN: 5 mg/dL — ABNORMAL LOW (ref 6–23)
BUN: 9 mg/dL (ref 6–23)
CO2: 24 mEq/L (ref 19–32)
CO2: 26 mEq/L (ref 19–32)
CO2: 27 mEq/L (ref 19–32)
CO2: 27 mEq/L (ref 19–32)
Calcium: 8.8 mg/dL (ref 8.4–10.5)
Calcium: 8.9 mg/dL (ref 8.4–10.5)
Calcium: 8.9 mg/dL (ref 8.4–10.5)
Calcium: 9.6 mg/dL (ref 8.4–10.5)
Chloride: 102 mEq/L (ref 96–112)
Chloride: 103 mEq/L (ref 96–112)
Chloride: 80 mEq/L — ABNORMAL LOW (ref 96–112)
Chloride: 90 mEq/L — ABNORMAL LOW (ref 96–112)
Creatinine, Ser: 0.62 mg/dL (ref 0.4–1.5)
Creatinine, Ser: 0.63 mg/dL (ref 0.4–1.5)
Creatinine, Ser: 0.96 mg/dL (ref 0.4–1.5)
Creatinine, Ser: 1.05 mg/dL (ref 0.4–1.5)
GFR calc Af Amer: >60 mL/min (ref >60)
GFR calc Af Amer: >60 mL/min (ref >60)
GFR calc Af Amer: >60 mL/min (ref >60)
GFR calc Af Amer: >60 mL/min (ref >60)
GFR calc non Af Amer: >60 mL/min (ref >60)
GFR calc non Af Amer: >60 mL/min (ref >60)
GFR calc non Af Amer: >60 mL/min (ref >60)
GFR calc non Af Amer: >60 mL/min (ref >60)
Glucose, Bld: 1056 mg/dL (ref 70–99)
Glucose, Bld: 200 mg/dL — ABNORMAL HIGH (ref 70–99)
Glucose, Bld: 289 mg/dL — ABNORMAL HIGH (ref 70–99)
Glucose, Bld: 698 mg/dL (ref 70–99)
Potassium: 3.3 mEq/L — ABNORMAL LOW (ref 3.5–5.1)
Potassium: 3.7 mEq/L (ref 3.5–5.1)
Potassium: 3.8 mEq/L (ref 3.5–5.1)
Potassium: 4.4 mEq/L (ref 3.5–5.1)
Sodium: 115 mEq/L — CL (ref 135–145)
Sodium: 124 mEq/L — ABNORMAL LOW (ref 135–145)
Sodium: 135 mEq/L (ref 135–145)
Sodium: 137 mEq/L (ref 135–145)

## 2010-06-21 LAB — POCT I-STAT, CHEM 8
BUN: 9 mg/dL (ref 6–23)
Calcium, Ion: 1.19 mmol/L (ref 1.12–1.32)
Chloride: 95 mEq/L — ABNORMAL LOW (ref 96–112)
Creatinine, Ser: 0.8 mg/dL (ref 0.4–1.5)
Glucose, Bld: 558 mg/dL (ref 70–99)
HCT: 46 % (ref 39.0–52.0)
Hemoglobin: 15.6 g/dL (ref 13.0–17.0)
Potassium: 4.5 mEq/L (ref 3.5–5.1)
Sodium: 131 mEq/L — ABNORMAL LOW (ref 135–145)
TCO2: 29 mmol/L (ref 0–100)

## 2010-06-21 LAB — GLUCOSE, CAPILLARY
Glucose-Capillary: 485 mg/dL — ABNORMAL HIGH (ref 70–99)
Glucose-Capillary: 168 mg/dL — ABNORMAL HIGH (ref 70–99)
Glucose-Capillary: 170 mg/dL — ABNORMAL HIGH (ref 70–99)
Glucose-Capillary: 172 mg/dL — ABNORMAL HIGH (ref 70–99)
Glucose-Capillary: 199 mg/dL — ABNORMAL HIGH (ref 70–99)
Glucose-Capillary: 221 mg/dL — ABNORMAL HIGH (ref 70–99)
Glucose-Capillary: 232 mg/dL — ABNORMAL HIGH (ref 70–99)
Glucose-Capillary: 244 mg/dL — ABNORMAL HIGH (ref 70–99)
Glucose-Capillary: 246 mg/dL — ABNORMAL HIGH (ref 70–99)
Glucose-Capillary: 253 mg/dL — ABNORMAL HIGH (ref 70–99)
Glucose-Capillary: 254 mg/dL — ABNORMAL HIGH (ref 70–99)
Glucose-Capillary: 256 mg/dL — ABNORMAL HIGH (ref 70–99)
Glucose-Capillary: 259 mg/dL — ABNORMAL HIGH (ref 70–99)
Glucose-Capillary: 264 mg/dL — ABNORMAL HIGH (ref 70–99)
Glucose-Capillary: 303 mg/dL — ABNORMAL HIGH (ref 70–99)
Glucose-Capillary: 304 mg/dL — ABNORMAL HIGH (ref 70–99)
Glucose-Capillary: 318 mg/dL — ABNORMAL HIGH (ref 70–99)
Glucose-Capillary: 335 mg/dL — ABNORMAL HIGH (ref 70–99)
Glucose-Capillary: 350 mg/dL — ABNORMAL HIGH (ref 70–99)
Glucose-Capillary: 400 mg/dL — ABNORMAL HIGH (ref 70–99)
Glucose-Capillary: 476 mg/dL — ABNORMAL HIGH (ref 70–99)
Glucose-Capillary: 590 mg/dL (ref 70–99)
Glucose-Capillary: >600 mg/dL (ref 70–99)
Glucose-Capillary: >600 mg/dL (ref 70–99)

## 2010-06-21 LAB — T-HELPER CELL (CD4) - (RCID CLINIC ONLY)
CD4 % Helper T Cell: 6 % — ABNORMAL LOW (ref 33–55)
CD4 T Cell Abs: 160 uL — ABNORMAL LOW (ref 400–2700)

## 2010-06-21 LAB — HEMOGLOBIN A1C
Hgb A1c MFr Bld: 18.9 % — ABNORMAL HIGH (ref <5.7)
Mean Plasma Glucose: 496 mg/dL — ABNORMAL HIGH (ref <117)

## 2010-06-21 LAB — DRUGS OF ABUSE SCREEN W/O ALC, ROUTINE URINE
Amphetamine Screen, Ur: NEGATIVE
Barbiturate Quant, Ur: NEGATIVE
Benzodiazepines.: NEGATIVE
Cocaine Metabolites: NEGATIVE
Creatinine,U: 182.5 mg/dL
Marijuana Metabolite: NEGATIVE
Methadone: NEGATIVE
Opiate Screen, Urine: NEGATIVE
Phencyclidine (PCP): NEGATIVE
Propoxyphene: NEGATIVE

## 2010-06-21 LAB — URINE MICROSCOPIC-ADD ON

## 2010-06-21 LAB — PROTIME-INR
INR: 0.96 (ref 0.00–1.49)
Prothrombin Time: 13 seconds (ref 11.6–15.2)

## 2010-06-21 LAB — POCT I-STAT 3, ART BLOOD GAS (G3+)
Bicarbonate: 26.3 mEq/L — ABNORMAL HIGH (ref 20.0–24.0)
O2 Saturation: 95 %
Patient temperature: 98.7
TCO2: 28 mmol/L (ref 0–100)
pCO2 arterial: 45 mmHg (ref 35.0–45.0)
pH, Arterial: 7.375 (ref 7.350–7.450)
pO2, Arterial: 78 mmHg — ABNORMAL LOW (ref 80.0–100.0)

## 2010-06-21 LAB — KETONES, QUALITATIVE
Acetone, Bld: NEGATIVE
Acetone, Bld: NEGATIVE

## 2010-06-21 LAB — PHOSPHORUS: Phosphorus: 3.1 mg/dL (ref 2.3–4.6)

## 2010-06-21 LAB — LIPID PANEL
Cholesterol: 239 mg/dL — ABNORMAL HIGH (ref 0–200)
HDL: 47 mg/dL (ref >39)
LDL Cholesterol: UNDETERMINED mg/dL (ref 0–99)
Total CHOL/HDL Ratio: 5.1 RATIO
Triglycerides: 531 mg/dL — ABNORMAL HIGH (ref <150)
VLDL: UNDETERMINED mg/dL (ref 0–40)

## 2010-06-21 LAB — MAGNESIUM: Magnesium: 1.9 mg/dL (ref 1.5–2.5)

## 2010-06-21 LAB — APTT: aPTT: 26 seconds (ref 24–37)

## 2010-06-21 LAB — TSH: TSH: 2.204 u[IU]/mL (ref 0.350–4.500)

## 2010-06-22 ENCOUNTER — Observation Stay (HOSPITAL_COMMUNITY)
Admission: EM | Admit: 2010-06-22 | Discharge: 2010-06-23 | Disposition: A | Discharge status: Home / Self Care | Payer: Medicaid Other | Attending: Emergency Medicine | Admitting: Emergency Medicine

## 2010-06-22 DIAGNOSIS — R3589 Other polyuria: Secondary | ICD-10-CM | POA: Insufficient documentation

## 2010-06-22 DIAGNOSIS — R109 Unspecified abdominal pain: Secondary | ICD-10-CM | POA: Insufficient documentation

## 2010-06-22 DIAGNOSIS — E119 Type 2 diabetes mellitus without complications: Principal | ICD-10-CM | POA: Insufficient documentation

## 2010-06-22 DIAGNOSIS — G8929 Other chronic pain: Secondary | ICD-10-CM | POA: Insufficient documentation

## 2010-06-22 DIAGNOSIS — Z21 Asymptomatic human immunodeficiency virus [HIV] infection status: Secondary | ICD-10-CM | POA: Insufficient documentation

## 2010-06-22 DIAGNOSIS — Z794 Long term (current) use of insulin: Secondary | ICD-10-CM | POA: Insufficient documentation

## 2010-06-22 DIAGNOSIS — R358 Other polyuria: Secondary | ICD-10-CM | POA: Insufficient documentation

## 2010-06-22 DIAGNOSIS — R5381 Other malaise: Secondary | ICD-10-CM | POA: Insufficient documentation

## 2010-06-22 DIAGNOSIS — R631 Polydipsia: Secondary | ICD-10-CM | POA: Insufficient documentation

## 2010-06-22 LAB — BASIC METABOLIC PANEL
BUN: 13 mg/dL (ref 6–23)
BUN: 14 mg/dL (ref 6–23)
BUN: 7 mg/dL (ref 6–23)
CO2: 22 mEq/L (ref 19–32)
CO2: 25 mEq/L (ref 19–32)
CO2: 26 mEq/L (ref 19–32)
Calcium: 10 mg/dL (ref 8.4–10.5)
Calcium: 8.6 mg/dL (ref 8.4–10.5)
Calcium: 9.6 mg/dL (ref 8.4–10.5)
Chloride: 101 mEq/L (ref 96–112)
Chloride: 87 mEq/L — ABNORMAL LOW (ref 96–112)
Chloride: 87 mEq/L — ABNORMAL LOW (ref 96–112)
Creatinine, Ser: 0.66 mg/dL (ref 0.4–1.5)
Creatinine, Ser: 1.02 mg/dL (ref 0.4–1.5)
Creatinine, Ser: 1.08 mg/dL (ref 0.4–1.5)
GFR calc Af Amer: >60 mL/min (ref >60)
GFR calc Af Amer: >60 mL/min (ref >60)
GFR calc Af Amer: >60 mL/min (ref >60)
GFR calc non Af Amer: >60 mL/min (ref >60)
GFR calc non Af Amer: >60 mL/min (ref >60)
GFR calc non Af Amer: >60 mL/min (ref >60)
Glucose, Bld: 110 mg/dL — ABNORMAL HIGH (ref 70–99)
Glucose, Bld: 683 mg/dL (ref 70–99)
Glucose, Bld: 858 mg/dL (ref 70–99)
Potassium: 3.3 mEq/L — ABNORMAL LOW (ref 3.5–5.1)
Potassium: 4.4 mEq/L (ref 3.5–5.1)
Potassium: 4.4 mEq/L (ref 3.5–5.1)
Sodium: 122 mEq/L — ABNORMAL LOW (ref 135–145)
Sodium: 124 mEq/L — ABNORMAL LOW (ref 135–145)
Sodium: 135 mEq/L (ref 135–145)

## 2010-06-22 LAB — CBC
HCT: 37.6 % — ABNORMAL LOW (ref 39.0–52.0)
HCT: 41.6 % (ref 39.0–52.0)
HCT: 42 % (ref 39.0–52.0)
HCT: 39.3 % (ref 39.0–52.0)
Hemoglobin: 13.2 g/dL (ref 13.0–17.0)
Hemoglobin: 14.2 g/dL (ref 13.0–17.0)
Hemoglobin: 14.8 g/dL (ref 13.0–17.0)
Hemoglobin: 13.7 g/dL (ref 13.0–17.0)
MCH: 26.6 pg (ref 26.0–34.0)
MCH: 27.4 pg (ref 26.0–34.0)
MCH: 27.6 pg (ref 26.0–34.0)
MCH: 27.2 pg (ref 26.0–34.0)
MCHC: 34.1 g/dL (ref 30.0–36.0)
MCHC: 35.1 g/dL (ref 30.0–36.0)
MCHC: 35.2 g/dL (ref 30.0–36.0)
MCHC: 34.9 g/dL (ref 30.0–36.0)
MCV: 77.8 fL — ABNORMAL LOW (ref 78.0–100.0)
MCV: 77.9 fL — ABNORMAL LOW (ref 78.0–100.0)
MCV: 78.5 fL (ref 78.0–100.0)
MCV: 78 fL (ref 78.0–100.0)
Platelets: 162 10*3/uL (ref 150–400)
Platelets: 166 10*3/uL (ref 150–400)
Platelets: 181 10*3/uL (ref 150–400)
Platelets: 207 10*3/uL (ref 150–400)
RBC: 4.79 MIL/uL (ref 4.22–5.81)
RBC: 5.34 MIL/uL (ref 4.22–5.81)
RBC: 5.4 MIL/uL (ref 4.22–5.81)
RBC: 5.04 MIL/uL (ref 4.22–5.81)
RDW: 13 % (ref 11.5–15.5)
RDW: 13 % (ref 11.5–15.5)
RDW: 13.4 % (ref 11.5–15.5)
RDW: 13.2 % (ref 11.5–15.5)
WBC: 5.3 10*3/uL (ref 4.0–10.5)
WBC: 6.4 10*3/uL (ref 4.0–10.5)
WBC: 6.7 10*3/uL (ref 4.0–10.5)
WBC: 6.1 10*3/uL (ref 4.0–10.5)

## 2010-06-22 LAB — HEPATIC FUNCTION PANEL
ALT: 20 U/L (ref 0–53)
AST: 26 U/L (ref 0–37)
Albumin: 4 g/dL (ref 3.5–5.2)
Alkaline Phosphatase: 105 U/L (ref 39–117)
Bilirubin, Direct: 0.1 mg/dL (ref 0.0–0.3)
Indirect Bilirubin: 0.6 mg/dL (ref 0.3–0.9)
Total Bilirubin: 0.7 mg/dL (ref 0.3–1.2)
Total Protein: 9 g/dL — ABNORMAL HIGH (ref 6.0–8.3)

## 2010-06-22 LAB — URINALYSIS, ROUTINE W REFLEX MICROSCOPIC
Bilirubin Urine: NEGATIVE
Bilirubin Urine: NEGATIVE
Glucose, UA: >1000 mg/dL — AB
Glucose, UA: >1000 mg/dL — AB
Hgb urine dipstick: NEGATIVE
Hgb urine dipstick: NEGATIVE
Ketones, ur: 15 mg/dL — AB
Ketones, ur: 15 mg/dL — AB
Leukocytes, UA: NEGATIVE
Leukocytes, UA: NEGATIVE
Nitrite: NEGATIVE
Nitrite: NEGATIVE
Protein, ur: NEGATIVE mg/dL
Protein, ur: NEGATIVE mg/dL
Specific Gravity, Urine: 1.01 (ref 1.005–1.030)
Specific Gravity, Urine: 1.01 (ref 1.005–1.030)
Urobilinogen, UA: 0.2 mg/dL (ref 0.0–1.0)
Urobilinogen, UA: 0.2 mg/dL (ref 0.0–1.0)
pH: 5.5 (ref 5.0–8.0)
pH: 5.5 (ref 5.0–8.0)

## 2010-06-22 LAB — GLUCOSE, CAPILLARY
Glucose-Capillary: 114 mg/dL — ABNORMAL HIGH (ref 70–99)
Glucose-Capillary: 118 mg/dL — ABNORMAL HIGH (ref 70–99)
Glucose-Capillary: 123 mg/dL — ABNORMAL HIGH (ref 70–99)
Glucose-Capillary: 136 mg/dL — ABNORMAL HIGH (ref 70–99)
Glucose-Capillary: 150 mg/dL — ABNORMAL HIGH (ref 70–99)
Glucose-Capillary: 158 mg/dL — ABNORMAL HIGH (ref 70–99)
Glucose-Capillary: 161 mg/dL — ABNORMAL HIGH (ref 70–99)
Glucose-Capillary: 183 mg/dL — ABNORMAL HIGH (ref 70–99)
Glucose-Capillary: 187 mg/dL — ABNORMAL HIGH (ref 70–99)
Glucose-Capillary: 208 mg/dL — ABNORMAL HIGH (ref 70–99)
Glucose-Capillary: 212 mg/dL — ABNORMAL HIGH (ref 70–99)
Glucose-Capillary: 216 mg/dL — ABNORMAL HIGH (ref 70–99)
Glucose-Capillary: 221 mg/dL — ABNORMAL HIGH (ref 70–99)
Glucose-Capillary: 224 mg/dL — ABNORMAL HIGH (ref 70–99)
Glucose-Capillary: 229 mg/dL — ABNORMAL HIGH (ref 70–99)
Glucose-Capillary: 241 mg/dL — ABNORMAL HIGH (ref 70–99)
Glucose-Capillary: 244 mg/dL — ABNORMAL HIGH (ref 70–99)
Glucose-Capillary: 253 mg/dL — ABNORMAL HIGH (ref 70–99)
Glucose-Capillary: 267 mg/dL — ABNORMAL HIGH (ref 70–99)
Glucose-Capillary: 288 mg/dL — ABNORMAL HIGH (ref 70–99)
Glucose-Capillary: 290 mg/dL — ABNORMAL HIGH (ref 70–99)
Glucose-Capillary: 302 mg/dL — ABNORMAL HIGH (ref 70–99)
Glucose-Capillary: 309 mg/dL — ABNORMAL HIGH (ref 70–99)
Glucose-Capillary: 320 mg/dL — ABNORMAL HIGH (ref 70–99)
Glucose-Capillary: 321 mg/dL — ABNORMAL HIGH (ref 70–99)
Glucose-Capillary: 342 mg/dL — ABNORMAL HIGH (ref 70–99)
Glucose-Capillary: 344 mg/dL — ABNORMAL HIGH (ref 70–99)
Glucose-Capillary: 346 mg/dL — ABNORMAL HIGH (ref 70–99)
Glucose-Capillary: 347 mg/dL — ABNORMAL HIGH (ref 70–99)
Glucose-Capillary: 349 mg/dL — ABNORMAL HIGH (ref 70–99)
Glucose-Capillary: 363 mg/dL — ABNORMAL HIGH (ref 70–99)
Glucose-Capillary: 369 mg/dL — ABNORMAL HIGH (ref 70–99)
Glucose-Capillary: 383 mg/dL — ABNORMAL HIGH (ref 70–99)
Glucose-Capillary: 397 mg/dL — ABNORMAL HIGH (ref 70–99)
Glucose-Capillary: 414 mg/dL — ABNORMAL HIGH (ref 70–99)
Glucose-Capillary: 423 mg/dL — ABNORMAL HIGH (ref 70–99)
Glucose-Capillary: 435 mg/dL — ABNORMAL HIGH (ref 70–99)
Glucose-Capillary: 454 mg/dL — ABNORMAL HIGH (ref 70–99)
Glucose-Capillary: 460 mg/dL — ABNORMAL HIGH (ref 70–99)
Glucose-Capillary: 461 mg/dL — ABNORMAL HIGH (ref 70–99)
Glucose-Capillary: 526 mg/dL — ABNORMAL HIGH (ref 70–99)
Glucose-Capillary: 541 mg/dL — ABNORMAL HIGH (ref 70–99)
Glucose-Capillary: 86 mg/dL (ref 70–99)
Glucose-Capillary: 97 mg/dL (ref 70–99)
Glucose-Capillary: >600 mg/dL (ref 70–99)
Glucose-Capillary: >600 mg/dL (ref 70–99)
Glucose-Capillary: >600 mg/dL (ref 70–99)

## 2010-06-22 LAB — POCT I-STAT, CHEM 8
BUN: 24 mg/dL — ABNORMAL HIGH (ref 6–23)
Calcium, Ion: 1.12 mmol/L (ref 1.12–1.32)
Chloride: 99 mEq/L (ref 96–112)
Creatinine, Ser: 1.1 mg/dL (ref 0.4–1.5)
Glucose, Bld: 602 mg/dL (ref 70–99)
HCT: 45 % (ref 39.0–52.0)
Hemoglobin: 15.3 g/dL (ref 13.0–17.0)
Potassium: 4.7 mEq/L (ref 3.5–5.1)
Sodium: 128 mEq/L — ABNORMAL LOW (ref 135–145)
TCO2: 23 mmol/L (ref 0–100)

## 2010-06-22 LAB — POCT CARDIAC MARKERS
CKMB, poc: 2.8 ng/mL (ref 1.0–8.0)
Myoglobin, poc: 202 ng/mL (ref 12–200)
Troponin i, poc: <0.05 ng/mL (ref 0.00–0.09)

## 2010-06-22 LAB — POCT I-STAT 3, ART BLOOD GAS (G3+)
Acid-base deficit: 2 mmol/L (ref 0.0–2.0)
Bicarbonate: 26.3 mEq/L — ABNORMAL HIGH (ref 20.0–24.0)
Bicarbonate: 22.7 mEq/L (ref 20.0–24.0)
O2 Saturation: 97 %
O2 Saturation: 97 %
Patient temperature: 98.6
Patient temperature: 98.6
TCO2: 28 mmol/L (ref 0–100)
TCO2: 24 mmol/L (ref 0–100)
pCO2 arterial: 45.3 mmHg — ABNORMAL HIGH (ref 35.0–45.0)
pCO2 arterial: 39.2 mmHg (ref 35.0–45.0)
pH, Arterial: 7.371 (ref 7.350–7.450)
pH, Arterial: 7.37 (ref 7.350–7.450)
pO2, Arterial: 91 mmHg (ref 80.0–100.0)
pO2, Arterial: 89 mmHg (ref 80.0–100.0)

## 2010-06-22 LAB — URINE MICROSCOPIC-ADD ON

## 2010-06-22 LAB — DIFFERENTIAL
Basophils Absolute: 0 10*3/uL (ref 0.0–0.1)
Basophils Absolute: 0 10*3/uL (ref 0.0–0.1)
Basophils Absolute: 0 10*3/uL (ref 0.0–0.1)
Basophils Relative: 0 % (ref 0–1)
Basophils Relative: 1 % (ref 0–1)
Basophils Relative: 0 % (ref 0–1)
Eosinophils Absolute: 0 10*3/uL (ref 0.0–0.7)
Eosinophils Absolute: 0 10*3/uL (ref 0.0–0.7)
Eosinophils Absolute: 0 10*3/uL (ref 0.0–0.7)
Eosinophils Relative: 1 % (ref 0–5)
Eosinophils Relative: 1 % (ref 0–5)
Eosinophils Relative: 1 % (ref 0–5)
Lymphocytes Relative: 40 % (ref 12–46)
Lymphocytes Relative: 42 % (ref 12–46)
Lymphocytes Relative: 35 % (ref 12–46)
Lymphs Abs: 2.2 10*3/uL (ref 0.7–4.0)
Lymphs Abs: 2.5 10*3/uL (ref 0.7–4.0)
Lymphs Abs: 2.1 10*3/uL (ref 0.7–4.0)
Monocytes Absolute: 0.4 10*3/uL (ref 0.1–1.0)
Monocytes Absolute: 0.6 10*3/uL (ref 0.1–1.0)
Monocytes Absolute: 0.5 10*3/uL (ref 0.1–1.0)
Monocytes Relative: 7 % (ref 3–12)
Monocytes Relative: 9 % (ref 3–12)
Monocytes Relative: 7 % (ref 3–12)
Neutro Abs: 2.7 10*3/uL (ref 1.7–7.7)
Neutro Abs: 3.2 10*3/uL (ref 1.7–7.7)
Neutro Abs: 3.5 10*3/uL (ref 1.7–7.7)
Neutrophils Relative %: 50 % (ref 43–77)
Neutrophils Relative %: 51 % (ref 43–77)
Neutrophils Relative %: 57 % (ref 43–77)

## 2010-06-22 LAB — CULTURE, ROUTINE-ABSCESS

## 2010-06-22 LAB — HIV-1 RNA, QUALITATIVE, TMA: HIV-1 RNA, Qualitative, TMA: DETECTED

## 2010-06-22 LAB — EPSTEIN-BARR VIRUS VCA, IGM: EBV VCA IgM: 0.13 {ISR}

## 2010-06-22 LAB — HIV-1 RNA QUANT-NO REFLEX-BLD
HIV 1 RNA Quant: 13800 copies/mL — ABNORMAL HIGH (ref <48)
HIV-1 RNA Quant, Log: 4.14 {Log} — ABNORMAL HIGH (ref <1.68)

## 2010-06-22 LAB — T-HELPER CELLS (CD4) COUNT (NOT AT ARMC)
CD4 % Helper T Cell: 7 % — ABNORMAL LOW (ref 33–55)
CD4 T Cell Abs: 150 uL — ABNORMAL LOW (ref 400–2700)

## 2010-06-22 LAB — KETONES, QUALITATIVE

## 2010-06-22 LAB — HIV-1 GENOTYPR PLUS: Resistance Assoc RT Mutations: NOT DETECTED

## 2010-06-22 LAB — RSV(RESPIRATORY SYNCYTIAL VIRUS) AB, BLOOD

## 2010-06-22 LAB — CMV IGM: CMV IgM: 0 (ref <0.90)

## 2010-06-22 LAB — GLUCOSE, RANDOM: Glucose, Bld: 340 mg/dL — ABNORMAL HIGH (ref 70–99)

## 2010-06-22 LAB — EPSTEIN-BARR VIRUS VCA, IGG: EBV VCA IgG: 7.17 {ISR} — ABNORMAL HIGH

## 2010-06-23 ENCOUNTER — Encounter: Payer: Self-pay | Admitting: Internal Medicine

## 2010-06-23 LAB — CBC
HCT: 40 % (ref 39.0–52.0)
HCT: 40.2 % (ref 39.0–52.0)
HCT: 40.7 % (ref 39.0–52.0)
Hemoglobin: 13.9 g/dL (ref 13.0–17.0)
Hemoglobin: 13.4 g/dL (ref 13.0–17.0)
Hemoglobin: 13.4 g/dL (ref 13.0–17.0)
MCH: 28.6 pg (ref 26.0–34.0)
MCH: 27.4 pg (ref 26.0–34.0)
MCH: 27.8 pg (ref 26.0–34.0)
MCHC: 34.7 g/dL (ref 30.0–36.0)
MCHC: 32.9 g/dL (ref 30.0–36.0)
MCHC: 33.4 g/dL (ref 30.0–36.0)
MCV: 82.5 fL (ref 78.0–100.0)
MCV: 83.1 fL (ref 78.0–100.0)
MCV: 83.4 fL (ref 78.0–100.0)
Platelets: 220 10*3/uL (ref 150–400)
Platelets: 202 10*3/uL (ref 150–400)
Platelets: 215 10*3/uL (ref 150–400)
RBC: 4.85 MIL/uL (ref 4.22–5.81)
RBC: 4.84 MIL/uL (ref 4.22–5.81)
RBC: 4.89 MIL/uL (ref 4.22–5.81)
RDW: 14.4 % (ref 11.5–15.5)
RDW: 14.3 % (ref 11.5–15.5)
RDW: 14.3 % (ref 11.5–15.5)
WBC: 5.4 10*3/uL (ref 4.0–10.5)
WBC: 4.8 10*3/uL (ref 4.0–10.5)
WBC: 5.1 10*3/uL (ref 4.0–10.5)

## 2010-06-23 LAB — GLUCOSE, CAPILLARY
Glucose-Capillary: 189 mg/dL — ABNORMAL HIGH (ref 70–99)
Glucose-Capillary: 203 mg/dL — ABNORMAL HIGH (ref 70–99)
Glucose-Capillary: 230 mg/dL — ABNORMAL HIGH (ref 70–99)
Glucose-Capillary: 243 mg/dL — ABNORMAL HIGH (ref 70–99)
Glucose-Capillary: 270 mg/dL — ABNORMAL HIGH (ref 70–99)
Glucose-Capillary: 278 mg/dL — ABNORMAL HIGH (ref 70–99)
Glucose-Capillary: 290 mg/dL — ABNORMAL HIGH (ref 70–99)
Glucose-Capillary: 291 mg/dL — ABNORMAL HIGH (ref 70–99)
Glucose-Capillary: 296 mg/dL — ABNORMAL HIGH (ref 70–99)
Glucose-Capillary: 311 mg/dL — ABNORMAL HIGH (ref 70–99)
Glucose-Capillary: 311 mg/dL — ABNORMAL HIGH (ref 70–99)
Glucose-Capillary: 326 mg/dL — ABNORMAL HIGH (ref 70–99)
Glucose-Capillary: 339 mg/dL — ABNORMAL HIGH (ref 70–99)
Glucose-Capillary: 359 mg/dL — ABNORMAL HIGH (ref 70–99)
Glucose-Capillary: 376 mg/dL — ABNORMAL HIGH (ref 70–99)
Glucose-Capillary: 383 mg/dL — ABNORMAL HIGH (ref 70–99)
Glucose-Capillary: 390 mg/dL — ABNORMAL HIGH (ref 70–99)
Glucose-Capillary: 392 mg/dL — ABNORMAL HIGH (ref 70–99)
Glucose-Capillary: 396 mg/dL — ABNORMAL HIGH (ref 70–99)
Glucose-Capillary: 404 mg/dL — ABNORMAL HIGH (ref 70–99)
Glucose-Capillary: 416 mg/dL — ABNORMAL HIGH (ref 70–99)
Glucose-Capillary: 421 mg/dL — ABNORMAL HIGH (ref 70–99)
Glucose-Capillary: 424 mg/dL — ABNORMAL HIGH (ref 70–99)
Glucose-Capillary: 471 mg/dL — ABNORMAL HIGH (ref 70–99)
Glucose-Capillary: 482 mg/dL — ABNORMAL HIGH (ref 70–99)
Glucose-Capillary: 532 mg/dL — ABNORMAL HIGH (ref 70–99)
Glucose-Capillary: 539 mg/dL — ABNORMAL HIGH (ref 70–99)
Glucose-Capillary: 574 mg/dL (ref 70–99)
Glucose-Capillary: >600 mg/dL (ref 70–99)
Glucose-Capillary: >600 mg/dL (ref 70–99)
Glucose-Capillary: >600 mg/dL (ref 70–99)
Glucose-Capillary: >600 mg/dL (ref 70–99)
Glucose-Capillary: >600 mg/dL (ref 70–99)

## 2010-06-23 LAB — POCT I-STAT, CHEM 8
BUN: 11 mg/dL (ref 6–23)
BUN: 12 mg/dL (ref 6–23)
BUN: 9 mg/dL (ref 6–23)
Calcium, Ion: 1.13 mmol/L (ref 1.12–1.32)
Calcium, Ion: 1.14 mmol/L (ref 1.12–1.32)
Calcium, Ion: 1.18 mmol/L (ref 1.12–1.32)
Chloride: 92 mEq/L — ABNORMAL LOW (ref 96–112)
Chloride: 95 mEq/L — ABNORMAL LOW (ref 96–112)
Chloride: 99 mEq/L (ref 96–112)
Creatinine, Ser: 0.8 mg/dL (ref 0.4–1.5)
Creatinine, Ser: 0.8 mg/dL (ref 0.4–1.5)
Creatinine, Ser: 0.9 mg/dL (ref 0.4–1.5)
Glucose, Bld: 480 mg/dL — ABNORMAL HIGH (ref 70–99)
Glucose, Bld: 559 mg/dL (ref 70–99)
Glucose, Bld: 578 mg/dL (ref 70–99)
HCT: 41 % (ref 39.0–52.0)
HCT: 42 % (ref 39.0–52.0)
HCT: 45 % (ref 39.0–52.0)
Hemoglobin: 13.9 g/dL (ref 13.0–17.0)
Hemoglobin: 14.3 g/dL (ref 13.0–17.0)
Hemoglobin: 15.3 g/dL (ref 13.0–17.0)
Potassium: 3.6 mEq/L (ref 3.5–5.1)
Potassium: 3.7 mEq/L (ref 3.5–5.1)
Potassium: 3.8 mEq/L (ref 3.5–5.1)
Sodium: 128 mEq/L — ABNORMAL LOW (ref 135–145)
Sodium: 129 mEq/L — ABNORMAL LOW (ref 135–145)
Sodium: 131 mEq/L — ABNORMAL LOW (ref 135–145)
TCO2: 25 mmol/L (ref 0–100)
TCO2: 25 mmol/L (ref 0–100)
TCO2: 29 mmol/L (ref 0–100)

## 2010-06-23 LAB — BASIC METABOLIC PANEL
BUN: 13 mg/dL (ref 6–23)
BUN: 16 mg/dL (ref 6–23)
BUN: 12 mg/dL (ref 6–23)
BUN: 9 mg/dL (ref 6–23)
CO2: 25 mEq/L (ref 19–32)
CO2: 25 mEq/L (ref 19–32)
CO2: 24 mEq/L (ref 19–32)
CO2: 25 mEq/L (ref 19–32)
Calcium: 8.4 mg/dL (ref 8.4–10.5)
Calcium: 9.7 mg/dL (ref 8.4–10.5)
Calcium: 9 mg/dL (ref 8.4–10.5)
Calcium: 9.8 mg/dL (ref 8.4–10.5)
Chloride: 100 mEq/L (ref 96–112)
Chloride: 87 mEq/L — ABNORMAL LOW (ref 96–112)
Chloride: 85 mEq/L — ABNORMAL LOW (ref 96–112)
Chloride: 95 mEq/L — ABNORMAL LOW (ref 96–112)
Creatinine, Ser: 0.74 mg/dL (ref 0.4–1.5)
Creatinine, Ser: 0.94 mg/dL (ref 0.4–1.5)
Creatinine, Ser: 0.75 mg/dL (ref 0.4–1.5)
Creatinine, Ser: 1.1 mg/dL (ref 0.4–1.5)
GFR calc Af Amer: >60 mL/min (ref >60)
GFR calc Af Amer: >60 mL/min (ref >60)
GFR calc Af Amer: >60 mL/min (ref >60)
GFR calc Af Amer: >60 mL/min (ref >60)
GFR calc non Af Amer: >60 mL/min (ref >60)
GFR calc non Af Amer: >60 mL/min (ref >60)
GFR calc non Af Amer: >60 mL/min (ref >60)
GFR calc non Af Amer: >60 mL/min (ref >60)
Glucose, Bld: 316 mg/dL — ABNORMAL HIGH (ref 70–99)
Glucose, Bld: 635 mg/dL (ref 70–99)
Glucose, Bld: 418 mg/dL — ABNORMAL HIGH (ref 70–99)
Glucose, Bld: 890 mg/dL (ref 70–99)
Potassium: 3.2 mEq/L — ABNORMAL LOW (ref 3.5–5.1)
Potassium: 3.9 mEq/L (ref 3.5–5.1)
Potassium: 3.5 mEq/L (ref 3.5–5.1)
Potassium: 5.5 mEq/L — ABNORMAL HIGH (ref 3.5–5.1)
Sodium: 125 mEq/L — ABNORMAL LOW (ref 135–145)
Sodium: 132 mEq/L — ABNORMAL LOW (ref 135–145)
Sodium: 119 mEq/L — CL (ref 135–145)
Sodium: 130 mEq/L — ABNORMAL LOW (ref 135–145)

## 2010-06-23 LAB — URINALYSIS, ROUTINE W REFLEX MICROSCOPIC
Bilirubin Urine: NEGATIVE
Bilirubin Urine: NEGATIVE
Bilirubin Urine: NEGATIVE
Glucose, UA: >1000 mg/dL — AB
Glucose, UA: >1000 mg/dL — AB
Glucose, UA: >1000 mg/dL — AB
Hgb urine dipstick: NEGATIVE
Hgb urine dipstick: NEGATIVE
Hgb urine dipstick: NEGATIVE
Ketones, ur: NEGATIVE mg/dL
Ketones, ur: NEGATIVE mg/dL
Leukocytes, UA: NEGATIVE
Leukocytes, UA: NEGATIVE
Leukocytes, UA: NEGATIVE
Nitrite: NEGATIVE
Nitrite: NEGATIVE
Nitrite: NEGATIVE
Protein, ur: NEGATIVE mg/dL
Protein, ur: NEGATIVE mg/dL
Protein, ur: NEGATIVE mg/dL
Specific Gravity, Urine: 1.031 — ABNORMAL HIGH (ref 1.005–1.030)
Specific Gravity, Urine: 1.028 (ref 1.005–1.030)
Specific Gravity, Urine: 1.029 (ref 1.005–1.030)
Urobilinogen, UA: 0.2 mg/dL (ref 0.0–1.0)
Urobilinogen, UA: 0.2 mg/dL (ref 0.0–1.0)
Urobilinogen, UA: 0.2 mg/dL (ref 0.0–1.0)
pH: 5.5 (ref 5.0–8.0)
pH: 6 (ref 5.0–8.0)
pH: 6.5 (ref 5.0–8.0)

## 2010-06-23 LAB — DIFFERENTIAL
Basophils Absolute: 0 10*3/uL (ref 0.0–0.1)
Basophils Absolute: 0 10*3/uL (ref 0.0–0.1)
Basophils Absolute: 0 10*3/uL (ref 0.0–0.1)
Basophils Relative: 0 % (ref 0–1)
Basophils Relative: 0 % (ref 0–1)
Basophils Relative: 0 % (ref 0–1)
Eosinophils Absolute: 0.1 10*3/uL (ref 0.0–0.7)
Eosinophils Absolute: 0 10*3/uL (ref 0.0–0.7)
Eosinophils Absolute: 0 10*3/uL (ref 0.0–0.7)
Eosinophils Relative: 1 % (ref 0–5)
Eosinophils Relative: 1 % (ref 0–5)
Eosinophils Relative: 1 % (ref 0–5)
Lymphocytes Relative: 39 % (ref 12–46)
Lymphocytes Relative: 25 % (ref 12–46)
Lymphocytes Relative: 33 % (ref 12–46)
Lymphs Abs: 2.1 10*3/uL (ref 0.7–4.0)
Lymphs Abs: 1.3 10*3/uL (ref 0.7–4.0)
Lymphs Abs: 1.6 10*3/uL (ref 0.7–4.0)
Monocytes Absolute: 0.4 10*3/uL (ref 0.1–1.0)
Monocytes Absolute: 0.4 10*3/uL (ref 0.1–1.0)
Monocytes Absolute: 0.4 10*3/uL (ref 0.1–1.0)
Monocytes Relative: 7 % (ref 3–12)
Monocytes Relative: 7 % (ref 3–12)
Monocytes Relative: 9 % (ref 3–12)
Neutro Abs: 2.8 10*3/uL (ref 1.7–7.7)
Neutro Abs: 2.7 10*3/uL (ref 1.7–7.7)
Neutro Abs: 3.4 10*3/uL (ref 1.7–7.7)
Neutrophils Relative %: 52 % (ref 43–77)
Neutrophils Relative %: 57 % (ref 43–77)
Neutrophils Relative %: 67 % (ref 43–77)

## 2010-06-23 LAB — COMPREHENSIVE METABOLIC PANEL
ALT: 15 U/L (ref 0–53)
ALT: 16 U/L (ref 0–53)
ALT: 17 U/L (ref 0–53)
AST: 20 U/L (ref 0–37)
AST: 20 U/L (ref 0–37)
AST: 32 U/L (ref 0–37)
Albumin: 3.8 g/dL (ref 3.5–5.2)
Albumin: 3.9 g/dL (ref 3.5–5.2)
Albumin: 4 g/dL (ref 3.5–5.2)
Alkaline Phosphatase: 112 U/L (ref 39–117)
Alkaline Phosphatase: 88 U/L (ref 39–117)
Alkaline Phosphatase: 92 U/L (ref 39–117)
BUN: 10 mg/dL (ref 6–23)
BUN: 12 mg/dL (ref 6–23)
BUN: 19 mg/dL (ref 6–23)
CO2: 24 mEq/L (ref 19–32)
CO2: 27 mEq/L (ref 19–32)
CO2: 29 mEq/L (ref 19–32)
Calcium: 10 mg/dL (ref 8.4–10.5)
Calcium: 9.6 mg/dL (ref 8.4–10.5)
Calcium: 9.7 mg/dL (ref 8.4–10.5)
Chloride: 83 mEq/L — ABNORMAL LOW (ref 96–112)
Chloride: 90 mEq/L — ABNORMAL LOW (ref 96–112)
Chloride: 92 mEq/L — ABNORMAL LOW (ref 96–112)
Creatinine, Ser: 0.84 mg/dL (ref 0.4–1.5)
Creatinine, Ser: 0.96 mg/dL (ref 0.4–1.5)
Creatinine, Ser: 1.03 mg/dL (ref 0.4–1.5)
GFR calc Af Amer: >60 mL/min (ref >60)
GFR calc Af Amer: >60 mL/min (ref >60)
GFR calc Af Amer: >60 mL/min (ref >60)
GFR calc non Af Amer: >60 mL/min (ref >60)
GFR calc non Af Amer: >60 mL/min (ref >60)
GFR calc non Af Amer: >60 mL/min (ref >60)
Glucose, Bld: 466 mg/dL — ABNORMAL HIGH (ref 70–99)
Glucose, Bld: 571 mg/dL (ref 70–99)
Glucose, Bld: 933 mg/dL (ref 70–99)
Potassium: 3.7 mEq/L (ref 3.5–5.1)
Potassium: 4.4 mEq/L (ref 3.5–5.1)
Potassium: 4.6 mEq/L (ref 3.5–5.1)
Sodium: 121 mEq/L — ABNORMAL LOW (ref 135–145)
Sodium: 124 mEq/L — ABNORMAL LOW (ref 135–145)
Sodium: 128 mEq/L — ABNORMAL LOW (ref 135–145)
Total Bilirubin: 0.4 mg/dL (ref 0.3–1.2)
Total Bilirubin: 0.5 mg/dL (ref 0.3–1.2)
Total Bilirubin: 1.1 mg/dL (ref 0.3–1.2)
Total Protein: 7.7 g/dL (ref 6.0–8.3)
Total Protein: 8.2 g/dL (ref 6.0–8.3)
Total Protein: 8.5 g/dL — ABNORMAL HIGH (ref 6.0–8.3)

## 2010-06-23 LAB — LIPASE, BLOOD: Lipase: 27 U/L (ref 11–59)

## 2010-06-23 LAB — T-HELPER CELL (CD4) - (RCID CLINIC ONLY)
CD4 % Helper T Cell: 8 % — ABNORMAL LOW (ref 33–55)
CD4 T Cell Abs: 100 uL — ABNORMAL LOW (ref 400–2700)

## 2010-06-23 LAB — RAPID URINE DRUG SCREEN, HOSP PERFORMED
Amphetamines: NOT DETECTED
Barbiturates: NOT DETECTED
Benzodiazepines: NOT DETECTED
Cocaine: NOT DETECTED
Opiates: NOT DETECTED
Tetrahydrocannabinol: NOT DETECTED

## 2010-06-23 LAB — POCT I-STAT 3, ART BLOOD GAS (G3+)
Bicarbonate: 25.5 mEq/L — ABNORMAL HIGH (ref 20.0–24.0)
O2 Saturation: 95 %
Patient temperature: 98.3
TCO2: 27 mmol/L (ref 0–100)
pCO2 arterial: 42.3 mmHg (ref 35.0–45.0)
pH, Arterial: 7.388 (ref 7.350–7.450)
pO2, Arterial: 79 mmHg — ABNORMAL LOW (ref 80.0–100.0)

## 2010-06-23 LAB — URINE MICROSCOPIC-ADD ON
Urine-Other: NONE SEEN
Urine-Other: NONE SEEN

## 2010-06-23 LAB — ETHANOL: Alcohol, Ethyl (B): <5 mg/dL (ref 0–10)

## 2010-06-23 NOTE — Progress Notes (Signed)
Patient was seen in the ED for high blood sugars. Says he has been compliant with his lantus and metformin. Has been seen in the ED multiple times for the same problem. Needs to follow up closely with pcp and Barnabas Harries.

## 2010-06-24 LAB — BASIC METABOLIC PANEL
BUN: 11 mg/dL (ref 6–23)
BUN: 9 mg/dL (ref 6–23)
CO2: 24 mEq/L (ref 19–32)
CO2: 24 mEq/L (ref 19–32)
Calcium: 8.6 mg/dL (ref 8.4–10.5)
Calcium: 9.6 mg/dL (ref 8.4–10.5)
Chloride: 104 mEq/L (ref 96–112)
Chloride: 95 mEq/L — ABNORMAL LOW (ref 96–112)
Creatinine, Ser: 0.64 mg/dL (ref 0.4–1.5)
Creatinine, Ser: 0.8 mg/dL (ref 0.4–1.5)
GFR calc Af Amer: >60 mL/min (ref >60)
GFR calc Af Amer: >60 mL/min (ref >60)
GFR calc non Af Amer: >60 mL/min (ref >60)
GFR calc non Af Amer: >60 mL/min (ref >60)
Glucose, Bld: 206 mg/dL — ABNORMAL HIGH (ref 70–99)
Glucose, Bld: 457 mg/dL — ABNORMAL HIGH (ref 70–99)
Potassium: 3.4 mEq/L — ABNORMAL LOW (ref 3.5–5.1)
Potassium: 4.2 mEq/L (ref 3.5–5.1)
Sodium: 135 mEq/L (ref 135–145)
Sodium: 128 mEq/L — ABNORMAL LOW (ref 135–145)

## 2010-06-24 LAB — CBC
HCT: 37.6 % — ABNORMAL LOW (ref 39.0–52.0)
Hemoglobin: 12.3 g/dL — ABNORMAL LOW (ref 13.0–17.0)
MCHC: 32.7 g/dL (ref 30.0–36.0)
MCV: 83.7 fL (ref 78.0–100.0)
Platelets: 291 10*3/uL (ref 150–400)
RBC: 4.49 MIL/uL (ref 4.22–5.81)
RDW: 14.7 % (ref 11.5–15.5)
WBC: 5.8 10*3/uL (ref 4.0–10.5)

## 2010-06-24 LAB — DIFFERENTIAL
Basophils Absolute: 0 10*3/uL (ref 0.0–0.1)
Basophils Relative: 0 % (ref 0–1)
Eosinophils Absolute: 0 10*3/uL (ref 0.0–0.7)
Eosinophils Relative: 1 % (ref 0–5)
Lymphocytes Relative: 43 % (ref 12–46)
Lymphs Abs: 2.5 10*3/uL (ref 0.7–4.0)
Monocytes Absolute: 0.4 10*3/uL (ref 0.1–1.0)
Monocytes Relative: 6 % (ref 3–12)
Neutro Abs: 2.8 10*3/uL (ref 1.7–7.7)
Neutrophils Relative %: 49 % (ref 43–77)

## 2010-06-24 LAB — GLUCOSE, CAPILLARY
Glucose-Capillary: 208 mg/dL — ABNORMAL HIGH (ref 70–99)
Glucose-Capillary: 246 mg/dL — ABNORMAL HIGH (ref 70–99)
Glucose-Capillary: 335 mg/dL — ABNORMAL HIGH (ref 70–99)
Glucose-Capillary: 371 mg/dL — ABNORMAL HIGH (ref 70–99)
Glucose-Capillary: 413 mg/dL — ABNORMAL HIGH (ref 70–99)
Glucose-Capillary: 442 mg/dL — ABNORMAL HIGH (ref 70–99)
Glucose-Capillary: 513 mg/dL — ABNORMAL HIGH (ref 70–99)
Glucose-Capillary: 129 mg/dL — ABNORMAL HIGH (ref 70–99)
Glucose-Capillary: 337 mg/dL — ABNORMAL HIGH (ref 70–99)
Glucose-Capillary: 511 mg/dL — ABNORMAL HIGH (ref 70–99)

## 2010-06-24 LAB — POCT I-STAT, CHEM 8
BUN: 19 mg/dL (ref 6–23)
Calcium, Ion: 1.17 mmol/L (ref 1.12–1.32)
Chloride: 96 mEq/L (ref 96–112)
Creatinine, Ser: 1 mg/dL (ref 0.4–1.5)
Glucose, Bld: 494 mg/dL — ABNORMAL HIGH (ref 70–99)
HCT: 47 % (ref 39.0–52.0)
Hemoglobin: 16 g/dL (ref 13.0–17.0)
Potassium: 4 mEq/L (ref 3.5–5.1)
Sodium: 131 mEq/L — ABNORMAL LOW (ref 135–145)
TCO2: 29 mmol/L (ref 0–100)

## 2010-06-24 LAB — POTASSIUM: Potassium: 4.2 mEq/L (ref 3.5–5.1)

## 2010-06-25 ENCOUNTER — Encounter: Payer: Self-pay | Admitting: Internal Medicine

## 2010-06-25 ENCOUNTER — Encounter: Payer: Self-pay | Admitting: *Deleted

## 2010-06-25 ENCOUNTER — Telehealth: Payer: Self-pay | Admitting: Adult Health

## 2010-06-25 ENCOUNTER — Ambulatory Visit (INDEPENDENT_AMBULATORY_CARE_PROVIDER_SITE_OTHER): Payer: Medicaid Other | Admitting: Internal Medicine

## 2010-06-25 DIAGNOSIS — I10 Essential (primary) hypertension: Secondary | ICD-10-CM

## 2010-06-25 DIAGNOSIS — M25569 Pain in unspecified knee: Secondary | ICD-10-CM

## 2010-06-25 DIAGNOSIS — E1165 Type 2 diabetes mellitus with hyperglycemia: Secondary | ICD-10-CM

## 2010-06-25 DIAGNOSIS — Z0000 Encounter for general adult medical examination without abnormal findings: Secondary | ICD-10-CM

## 2010-06-25 DIAGNOSIS — IMO0002 Reserved for concepts with insufficient information to code with codable children: Secondary | ICD-10-CM

## 2010-06-25 DIAGNOSIS — IMO0001 Reserved for inherently not codable concepts without codable children: Secondary | ICD-10-CM

## 2010-06-25 DIAGNOSIS — N521 Erectile dysfunction due to diseases classified elsewhere: Secondary | ICD-10-CM | POA: Insufficient documentation

## 2010-06-25 DIAGNOSIS — E1159 Type 2 diabetes mellitus with other circulatory complications: Secondary | ICD-10-CM | POA: Insufficient documentation

## 2010-06-25 DIAGNOSIS — E119 Type 2 diabetes mellitus without complications: Secondary | ICD-10-CM

## 2010-06-25 LAB — CBC
HCT: 35.8 % — ABNORMAL LOW (ref 39.0–52.0)
HCT: 35.8 % — ABNORMAL LOW (ref 39.0–52.0)
HCT: 36.1 % — ABNORMAL LOW (ref 39.0–52.0)
HCT: 36.4 % — ABNORMAL LOW (ref 39.0–52.0)
HCT: 36.9 % — ABNORMAL LOW (ref 39.0–52.0)
HCT: 39.1 % (ref 39.0–52.0)
Hemoglobin: 11.6 g/dL — ABNORMAL LOW (ref 13.0–17.0)
Hemoglobin: 11.8 g/dL — ABNORMAL LOW (ref 13.0–17.0)
Hemoglobin: 12.1 g/dL — ABNORMAL LOW (ref 13.0–17.0)
Hemoglobin: 12.5 g/dL — ABNORMAL LOW (ref 13.0–17.0)
Hemoglobin: 12.7 g/dL — ABNORMAL LOW (ref 13.0–17.0)
Hemoglobin: 12.8 g/dL — ABNORMAL LOW (ref 13.0–17.0)
MCH: 28 pg (ref 26.0–34.0)
MCHC: 32.3 g/dL (ref 30.0–36.0)
MCHC: 32.8 g/dL (ref 30.0–36.0)
MCHC: 32.9 g/dL (ref 30.0–36.0)
MCHC: 33.6 g/dL (ref 30.0–36.0)
MCHC: 34.3 g/dL (ref 30.0–36.0)
MCHC: 34.4 g/dL (ref 30.0–36.0)
MCV: 83.2 fL (ref 78.0–100.0)
MCV: 83.6 fL (ref 78.0–100.0)
MCV: 83.9 fL (ref 78.0–100.0)
MCV: 84.5 fL (ref 78.0–100.0)
MCV: 84.7 fL (ref 78.0–100.0)
MCV: 85.3 fL (ref 78.0–100.0)
Platelets: 160 10*3/uL (ref 150–400)
Platelets: 180 10*3/uL (ref 150–400)
Platelets: 185 10*3/uL (ref 150–400)
Platelets: 186 10*3/uL (ref 150–400)
Platelets: 200 10*3/uL (ref 150–400)
Platelets: 221 10*3/uL (ref 150–400)
RBC: 4.23 MIL/uL (ref 4.22–5.81)
RBC: 4.23 MIL/uL (ref 4.22–5.81)
RBC: 4.32 MIL/uL (ref 4.22–5.81)
RBC: 4.38 MIL/uL (ref 4.22–5.81)
RBC: 4.4 MIL/uL (ref 4.22–5.81)
RBC: 4.58 MIL/uL (ref 4.22–5.81)
RDW: 14.4 % (ref 11.5–15.5)
RDW: 15 % (ref 11.5–15.5)
RDW: 15 % (ref 11.5–15.5)
RDW: 15.1 % (ref 11.5–15.5)
RDW: 15.2 % (ref 11.5–15.5)
RDW: 15.6 % — ABNORMAL HIGH (ref 11.5–15.5)
WBC: 4.3 10*3/uL (ref 4.0–10.5)
WBC: 4.7 10*3/uL (ref 4.0–10.5)
WBC: 4.9 10*3/uL (ref 4.0–10.5)
WBC: 5.4 10*3/uL (ref 4.0–10.5)
WBC: 5.5 10*3/uL (ref 4.0–10.5)
WBC: 5.6 10*3/uL (ref 4.0–10.5)

## 2010-06-25 LAB — COMPREHENSIVE METABOLIC PANEL
ALT: 15 U/L (ref 0–53)
ALT: 15 U/L (ref 0–53)
AST: 21 U/L (ref 0–37)
AST: 27 U/L (ref 0–37)
Albumin: 2.8 g/dL — ABNORMAL LOW (ref 3.5–5.2)
Albumin: 2.9 g/dL — ABNORMAL LOW (ref 3.5–5.2)
Alkaline Phosphatase: 51 U/L (ref 39–117)
Alkaline Phosphatase: 60 U/L (ref 39–117)
BUN: 4 mg/dL — ABNORMAL LOW (ref 6–23)
BUN: 6 mg/dL (ref 6–23)
CO2: 27 mEq/L (ref 19–32)
CO2: 27 mEq/L (ref 19–32)
Calcium: 8.7 mg/dL (ref 8.4–10.5)
Calcium: 9.2 mg/dL (ref 8.4–10.5)
Chloride: 101 mEq/L (ref 96–112)
Chloride: 105 mEq/L (ref 96–112)
Creatinine, Ser: 0.64 mg/dL (ref 0.4–1.5)
Creatinine, Ser: 0.7 mg/dL (ref 0.4–1.5)
GFR calc Af Amer: >60 mL/min (ref >60)
GFR calc Af Amer: >60 mL/min (ref >60)
GFR calc non Af Amer: >60 mL/min (ref >60)
GFR calc non Af Amer: >60 mL/min (ref >60)
Glucose, Bld: 190 mg/dL — ABNORMAL HIGH (ref 70–99)
Glucose, Bld: 239 mg/dL — ABNORMAL HIGH (ref 70–99)
Potassium: 3.8 mEq/L (ref 3.5–5.1)
Potassium: 3.9 mEq/L (ref 3.5–5.1)
Sodium: 133 mEq/L — ABNORMAL LOW (ref 135–145)
Sodium: 137 mEq/L (ref 135–145)
Total Bilirubin: 0.9 mg/dL (ref 0.3–1.2)
Total Bilirubin: 1.4 mg/dL — ABNORMAL HIGH (ref 0.3–1.2)
Total Protein: 6.4 g/dL (ref 6.0–8.3)
Total Protein: 6.5 g/dL (ref 6.0–8.3)

## 2010-06-25 LAB — URINE MICROSCOPIC-ADD ON

## 2010-06-25 LAB — BASIC METABOLIC PANEL
BUN: 14 mg/dL (ref 6–23)
BUN: 15 mg/dL (ref 6–23)
BUN: 16 mg/dL (ref 6–23)
BUN: 6 mg/dL (ref 6–23)
CO2: 25 mEq/L (ref 19–32)
CO2: 25 mEq/L (ref 19–32)
CO2: 29 mEq/L (ref 19–32)
CO2: 29 mEq/L (ref 19–32)
Calcium: 10.2 mg/dL (ref 8.4–10.5)
Calcium: 8.4 mg/dL (ref 8.4–10.5)
Calcium: 9.2 mg/dL (ref 8.4–10.5)
Calcium: 9.4 mg/dL (ref 8.4–10.5)
Chloride: 84 mEq/L — ABNORMAL LOW (ref 96–112)
Chloride: 87 mEq/L — ABNORMAL LOW (ref 96–112)
Chloride: 97 mEq/L (ref 96–112)
Chloride: 94 mEq/L — ABNORMAL LOW (ref 96–112)
Creatinine, Ser: 0.76 mg/dL (ref 0.4–1.5)
Creatinine, Ser: 0.95 mg/dL (ref 0.4–1.5)
Creatinine, Ser: 1.21 mg/dL (ref 0.4–1.5)
Creatinine, Ser: 0.86 mg/dL (ref 0.4–1.5)
GFR calc Af Amer: >60 mL/min (ref >60)
GFR calc Af Amer: >60 mL/min (ref >60)
GFR calc Af Amer: >60 mL/min (ref >60)
GFR calc Af Amer: >60 mL/min (ref >60)
GFR calc non Af Amer: >60 mL/min (ref >60)
GFR calc non Af Amer: >60 mL/min (ref >60)
GFR calc non Af Amer: >60 mL/min (ref >60)
GFR calc non Af Amer: >60 mL/min (ref >60)
Glucose, Bld: 403 mg/dL — ABNORMAL HIGH (ref 70–99)
Glucose, Bld: 710 mg/dL (ref 70–99)
Glucose, Bld: 957 mg/dL (ref 70–99)
Glucose, Bld: 408 mg/dL — ABNORMAL HIGH (ref 70–99)
Potassium: 3.4 mEq/L — ABNORMAL LOW (ref 3.5–5.1)
Potassium: 4.1 mEq/L (ref 3.5–5.1)
Potassium: 4.4 mEq/L (ref 3.5–5.1)
Potassium: 3.6 mEq/L (ref 3.5–5.1)
Sodium: 120 mEq/L — ABNORMAL LOW (ref 135–145)
Sodium: 124 mEq/L — ABNORMAL LOW (ref 135–145)
Sodium: 129 mEq/L — ABNORMAL LOW (ref 135–145)
Sodium: 129 mEq/L — ABNORMAL LOW (ref 135–145)

## 2010-06-25 LAB — GLUCOSE, CAPILLARY
Glucose-Capillary: 101 mg/dL — ABNORMAL HIGH (ref 70–99)
Glucose-Capillary: 109 mg/dL — ABNORMAL HIGH (ref 70–99)
Glucose-Capillary: 129 mg/dL — ABNORMAL HIGH (ref 70–99)
Glucose-Capillary: 161 mg/dL — ABNORMAL HIGH (ref 70–99)
Glucose-Capillary: 169 mg/dL — ABNORMAL HIGH (ref 70–99)
Glucose-Capillary: 181 mg/dL — ABNORMAL HIGH (ref 70–99)
Glucose-Capillary: 188 mg/dL — ABNORMAL HIGH (ref 70–99)
Glucose-Capillary: 235 mg/dL — ABNORMAL HIGH (ref 70–99)
Glucose-Capillary: 237 mg/dL — ABNORMAL HIGH (ref 70–99)
Glucose-Capillary: 237 mg/dL — ABNORMAL HIGH (ref 70–99)
Glucose-Capillary: 258 mg/dL — ABNORMAL HIGH (ref 70–99)
Glucose-Capillary: 265 mg/dL — ABNORMAL HIGH (ref 70–99)
Glucose-Capillary: 266 mg/dL — ABNORMAL HIGH (ref 70–99)
Glucose-Capillary: 303 mg/dL — ABNORMAL HIGH (ref 70–99)
Glucose-Capillary: 315 mg/dL — ABNORMAL HIGH (ref 70–99)
Glucose-Capillary: 338 mg/dL — ABNORMAL HIGH (ref 70–99)
Glucose-Capillary: 375 mg/dL — ABNORMAL HIGH (ref 70–99)
Glucose-Capillary: 409 mg/dL — ABNORMAL HIGH (ref 70–99)
Glucose-Capillary: 415 mg/dL — ABNORMAL HIGH (ref 70–99)
Glucose-Capillary: 431 mg/dL — ABNORMAL HIGH (ref 70–99)
Glucose-Capillary: 435 mg/dL — ABNORMAL HIGH (ref 70–99)
Glucose-Capillary: 448 mg/dL — ABNORMAL HIGH (ref 70–99)
Glucose-Capillary: 460 mg/dL — ABNORMAL HIGH (ref 70–99)
Glucose-Capillary: 488 mg/dL — ABNORMAL HIGH (ref 70–99)
Glucose-Capillary: 498 mg/dL — ABNORMAL HIGH (ref 70–99)
Glucose-Capillary: 516 mg/dL — ABNORMAL HIGH (ref 70–99)
Glucose-Capillary: 519 mg/dL — ABNORMAL HIGH (ref 70–99)
Glucose-Capillary: >600 mg/dL (ref 70–99)
Glucose-Capillary: >600 mg/dL (ref 70–99)
Glucose-Capillary: >600 mg/dL (ref 70–99)
Glucose-Capillary: >600 mg/dL (ref 70–99)
Glucose-Capillary: >600 mg/dL (ref 70–99)
Glucose-Capillary: >600 mg/dL (ref 70–99)

## 2010-06-25 LAB — DIFFERENTIAL
Basophils Absolute: 0 10*3/uL (ref 0.0–0.1)
Basophils Absolute: 0 10*3/uL (ref 0.0–0.1)
Basophils Absolute: 0 10*3/uL (ref 0.0–0.1)
Basophils Absolute: 0 10*3/uL (ref 0.0–0.1)
Basophils Absolute: 0 10*3/uL (ref 0.0–0.1)
Basophils Relative: 0 % (ref 0–1)
Basophils Relative: 0 % (ref 0–1)
Basophils Relative: 0 % (ref 0–1)
Basophils Relative: 0 % (ref 0–1)
Basophils Relative: 0 % (ref 0–1)
Eosinophils Absolute: 0 10*3/uL (ref 0.0–0.7)
Eosinophils Absolute: 0 10*3/uL (ref 0.0–0.7)
Eosinophils Absolute: 0.1 10*3/uL (ref 0.0–0.7)
Eosinophils Absolute: 0.1 10*3/uL (ref 0.0–0.7)
Eosinophils Absolute: 0.1 10*3/uL (ref 0.0–0.7)
Eosinophils Relative: 1 % (ref 0–5)
Eosinophils Relative: 1 % (ref 0–5)
Eosinophils Relative: 1 % (ref 0–5)
Eosinophils Relative: 1 % (ref 0–5)
Eosinophils Relative: 1 % (ref 0–5)
Lymphocytes Relative: 27 % (ref 12–46)
Lymphocytes Relative: 28 % (ref 12–46)
Lymphocytes Relative: 29 % (ref 12–46)
Lymphocytes Relative: 37 % (ref 12–46)
Lymphocytes Relative: 40 % (ref 12–46)
Lymphs Abs: 1.4 10*3/uL (ref 0.7–4.0)
Lymphs Abs: 1.5 10*3/uL (ref 0.7–4.0)
Lymphs Abs: 1.5 10*3/uL (ref 0.7–4.0)
Lymphs Abs: 1.7 10*3/uL (ref 0.7–4.0)
Lymphs Abs: 2.3 10*3/uL (ref 0.7–4.0)
Monocytes Absolute: 0.4 10*3/uL (ref 0.1–1.0)
Monocytes Absolute: 0.4 10*3/uL (ref 0.1–1.0)
Monocytes Absolute: 0.4 10*3/uL (ref 0.1–1.0)
Monocytes Absolute: 0.4 10*3/uL (ref 0.1–1.0)
Monocytes Absolute: 0.4 10*3/uL (ref 0.1–1.0)
Monocytes Relative: 7 % (ref 3–12)
Monocytes Relative: 8 % (ref 3–12)
Monocytes Relative: 8 % (ref 3–12)
Monocytes Relative: 8 % (ref 3–12)
Monocytes Relative: 8 % (ref 3–12)
Neutro Abs: 2.5 10*3/uL (ref 1.7–7.7)
Neutro Abs: 2.9 10*3/uL (ref 1.7–7.7)
Neutro Abs: 3 10*3/uL (ref 1.7–7.7)
Neutro Abs: 3.4 10*3/uL (ref 1.7–7.7)
Neutro Abs: 3.5 10*3/uL (ref 1.7–7.7)
Neutrophils Relative %: 51 % (ref 43–77)
Neutrophils Relative %: 54 % (ref 43–77)
Neutrophils Relative %: 62 % (ref 43–77)
Neutrophils Relative %: 63 % (ref 43–77)
Neutrophils Relative %: 64 % (ref 43–77)

## 2010-06-25 LAB — POCT I-STAT, CHEM 8
BUN: 10 mg/dL (ref 6–23)
BUN: 18 mg/dL (ref 6–23)
BUN: 21 mg/dL (ref 6–23)
Calcium, Ion: 1.16 mmol/L (ref 1.12–1.32)
Calcium, Ion: 1.22 mmol/L (ref 1.12–1.32)
Calcium, Ion: 1.22 mmol/L (ref 1.12–1.32)
Chloride: 93 mEq/L — ABNORMAL LOW (ref 96–112)
Chloride: 94 mEq/L — ABNORMAL LOW (ref 96–112)
Chloride: 95 mEq/L — ABNORMAL LOW (ref 96–112)
Creatinine, Ser: 0.8 mg/dL (ref 0.4–1.5)
Creatinine, Ser: 0.9 mg/dL (ref 0.4–1.5)
Creatinine, Ser: 0.9 mg/dL (ref 0.4–1.5)
Glucose, Bld: 454 mg/dL — ABNORMAL HIGH (ref 70–99)
Glucose, Bld: >700 mg/dL (ref 70–99)
Glucose, Bld: >700 mg/dL (ref 70–99)
HCT: 40 % (ref 39.0–52.0)
HCT: 43 % (ref 39.0–52.0)
HCT: 46 % (ref 39.0–52.0)
Hemoglobin: 13.6 g/dL (ref 13.0–17.0)
Hemoglobin: 14.6 g/dL (ref 13.0–17.0)
Hemoglobin: 15.6 g/dL (ref 13.0–17.0)
Potassium: 3.8 mEq/L (ref 3.5–5.1)
Potassium: 4 mEq/L (ref 3.5–5.1)
Potassium: 4.7 mEq/L (ref 3.5–5.1)
Sodium: 125 mEq/L — ABNORMAL LOW (ref 135–145)
Sodium: 128 mEq/L — ABNORMAL LOW (ref 135–145)
Sodium: 130 mEq/L — ABNORMAL LOW (ref 135–145)
TCO2: 26 mmol/L (ref 0–100)
TCO2: 28 mmol/L (ref 0–100)
TCO2: 31 mmol/L (ref 0–100)

## 2010-06-25 LAB — URINALYSIS, ROUTINE W REFLEX MICROSCOPIC
Bilirubin Urine: NEGATIVE
Bilirubin Urine: NEGATIVE
Bilirubin Urine: NEGATIVE
Bilirubin Urine: NEGATIVE
Glucose, UA: >1000 mg/dL — AB
Glucose, UA: >1000 mg/dL — AB
Glucose, UA: >1000 mg/dL — AB
Glucose, UA: >1000 mg/dL — AB
Hgb urine dipstick: NEGATIVE
Hgb urine dipstick: NEGATIVE
Hgb urine dipstick: NEGATIVE
Hgb urine dipstick: NEGATIVE
Ketones, ur: 15 mg/dL — AB
Ketones, ur: NEGATIVE mg/dL
Ketones, ur: NEGATIVE mg/dL
Ketones, ur: NEGATIVE mg/dL
Leukocytes, UA: NEGATIVE
Leukocytes, UA: NEGATIVE
Leukocytes, UA: NEGATIVE
Leukocytes, UA: NEGATIVE
Nitrite: NEGATIVE
Nitrite: NEGATIVE
Nitrite: NEGATIVE
Nitrite: NEGATIVE
Protein, ur: NEGATIVE mg/dL
Protein, ur: NEGATIVE mg/dL
Protein, ur: NEGATIVE mg/dL
Protein, ur: NEGATIVE mg/dL
Specific Gravity, Urine: 1.029 (ref 1.005–1.030)
Specific Gravity, Urine: 1.031 — ABNORMAL HIGH (ref 1.005–1.030)
Specific Gravity, Urine: 1.031 — ABNORMAL HIGH (ref 1.005–1.030)
Specific Gravity, Urine: 1.035 — ABNORMAL HIGH (ref 1.005–1.030)
Urobilinogen, UA: 0.2 mg/dL (ref 0.0–1.0)
Urobilinogen, UA: 0.2 mg/dL (ref 0.0–1.0)
Urobilinogen, UA: 0.2 mg/dL (ref 0.0–1.0)
Urobilinogen, UA: 0.2 mg/dL (ref 0.0–1.0)
pH: 5.5 (ref 5.0–8.0)
pH: 5.5 (ref 5.0–8.0)
pH: 6 (ref 5.0–8.0)
pH: 6 (ref 5.0–8.0)

## 2010-06-25 LAB — POCT I-STAT 3, VENOUS BLOOD GAS (G3P V)
Acid-Base Excess: 7 mmol/L — ABNORMAL HIGH (ref 0.0–2.0)
Bicarbonate: 34.5 mEq/L — ABNORMAL HIGH (ref 20.0–24.0)
O2 Saturation: 58 %
TCO2: 36 mmol/L (ref 0–100)
pCO2, Ven: 54.7 mmHg — ABNORMAL HIGH (ref 45.0–50.0)
pH, Ven: 7.408 — ABNORMAL HIGH (ref 7.250–7.300)
pO2, Ven: 31 mmHg (ref 30.0–45.0)

## 2010-06-25 LAB — POCT GLYCOSYLATED HEMOGLOBIN (HGB A1C): Hemoglobin A1C: >14

## 2010-06-25 LAB — MAGNESIUM
Magnesium: 1.6 mg/dL (ref 1.5–2.5)
Magnesium: 1.8 mg/dL (ref 1.5–2.5)

## 2010-06-25 LAB — KETONES, QUALITATIVE
Acetone, Bld: NEGATIVE
Acetone, Bld: NEGATIVE

## 2010-06-25 LAB — GLUCOSE, RANDOM: Glucose, Bld: 564 mg/dL (ref 70–99)

## 2010-06-25 NOTE — Progress Notes (Unsigned)
  Subjective:    [name], male    DOB: January 09, 1966, 45 y.o.   MRN: [name]  HPI    Review of Systems     Objective:   Physical Exam        Assessment & Plan:

## 2010-06-25 NOTE — Patient Instructions (Signed)
Please follow-up at the clinic in 1 month, at which time we will reevaluate your diabetes and blood pressure.  Follow-up with Barnabas Harries within the next 2-3 weeks   Follow-up with Dr. Nori Riis or your orthopedic doctor within the next 1 month to follow-up your chronic knee pain  If symptoms worsen, or new symptoms arise, please call the clinic or go to the ER.  Stop drinking regular sodas, drink water instead  Please bring all of your medications in a bag to your next visit.  If you are diabetic, please bring your meter to your next visit.    Diabetes Meal Planning Guide The diabetes meal planning guide is a tool to help you plan your meals and snacks. It is important for people with diabetes to manage their blood sugar levels. Choosing the right foods and the right amounts throughout your day will help control your blood sugar. Eating right can even help you improve your blood pressure and reach or maintain a healthy weight. CARBOHYDRATE COUNTING MADE EASY When you eat carbohydrates, they turn to sugar (glucose). This raises your blood sugar level. Counting carbohydrates can help you control this level so you feel better. When you plan your meals by counting carbohydrates, you can have more flexibility in what you eat and balance your medicine with your food intake. Carbohydrate counting simply means adding up the total amount of carbohydrate grams (g) in your meals or snacks. Try to eat about the same amount at each meal. Foods with carbohydrates are listed below. Each portion below is 1 carbohydrate serving or 15 grams of carbohydrates. Ask your dietician how many grams of carbohydrates you should eat at each meal or snack. Grains and Starches 1 slice bread 1/2 English muffin or hotdog/hamburger bun 3/4 cup cold cereal (unsweetened) 1/3 cup cooked pasta or rice 1/2 cup starchy vegetables (corn, potatoes, peas, beans, winter squash) 1 tortilla (6 inches) 1/4 bagel 1 waffle or pancake  (size of a CD) 1/2 cup cooked cereal 4 to 6 small crackers *Whole grain is recommended Fruit 1 cup fresh unsweetened berries, melon, papaya, pineapple 1 small fresh fruit 1/2 banana or mango 1/2 cup fruit juice (4 ounces unsweetened) 1/2 cup canned fruit in natural juice or water 2 tablespoons dried fruit 12 to 15 grapes or cherries Milk and Yogurt 1 cup fat-free or 1% milk 1 cup soy milk 6 ounces light yogurt with sugar-free sweetener 6 ounces low-fat soy yogurt 6 ounces plain yogurt Vegetables 1 cup raw or 1/2 cup cooked is counted as 0 carbohydrates or a "free" food. If you eat 3 or more servings at one meal, count them as 1 carbohydrate serving. Other Carbohydrates 3/4 ounces chips or pretzels 1/2 cup ice cream or frozen yogurt 1/4 cup sherbet or sorbet 2 inch square cake, no frosting 1 tablespoon honey, sugar, jam, jelly, or syrup 2 small cookies 3 squares of graham crackers 3 cups popcorn 6 crackers 1 cup broth-based soup Count 1 cup casserole or other mixed foods as 2 carbohydrate servings. Foods with less than 20 calories in a serving may be counted as 0 carbohydrates or a "free" food. You may want to purchase a book or computer software that lists the carbohydrate gram counts of different foods. In addition, the nutrition facts panel on the labels of the foods you eat are a good source of this information. The label will tell you how big the serving size is and the total number of carbohydrate grams you will be eating  per serving. Divide this number by 15 to obtain the number of carbohydrate servings in a portion. Remember: 1 carbohydrate serving equals 15 grams of carbohydrate. SERVING SIZES Measuring foods and serving sizes helps you make sure you are getting the right amount of food. The list below tells how big or small some common serving sizes are.  1 ounce (oz) of cheese.................................4 stacked dice.   2 to 3 oz cooked  meat.................................Marland KitchenDeck of cards.   1 teaspoon (tsp)...........................................Marland KitchenTip of little finger.   1 tablespoon (tbs).......................................Marland KitchenMarland KitchenThumb.   2 tbs............................................................Marland KitchenGolf ball.    cup..........................................................Marland KitchenHalf of a fist.   1 cup...........................................................Marland KitchenA fist.  SAMPLE DIABETES MEAL PLAN Below is a sample meal plan that includes foods from the grain and starches, dairy, vegetable, fruit, and meat groups. A dietician can individualize a meal plan to fit your calorie needs and tell you the number of servings needed from each food group. However, controlling the total amount of carbohydrates in your meal or snack is more important than making sure you include all of the food groups at every meal. You may interchange carbohydrate containing foods (dairy, starches, and fruits). The meal plan below is an example of a 2000 calorie diet using carbohydrate counting. This meal plan has 17 carbohydrate servings (carb choices). Breakfast  1 cup oatmeal (2 carb choices)  3/4 cup light yogurt (1 carb choice) 1 cup blueberries (1 carb choice) 1/4 cup almonds   Snack  1 large apple (2 carb choices)  1 low-fat string cheese stick   Lunch  Chicken breast salad:   1 cup spinach     1/4 cup chopped tomatoes     2 oz chicken breast, sliced     2 tbs low-fat [place] dressing  12 whole-wheat crackers (2 carb choices) 12 to 15 grapes (1 carb choice) 1 cup low-fat milk (1 carb choice)   Snack  1 cup carrots  1/2 cup hummus (1 carb choice)   Dinner  3 oz broiled salmon  1 cup brown rice (3 carb choices)   Snack  1 1/2 cups steamed broccoli (1 carb choice) drizzled with 1 tsp olive oil and lemon juice  1 cup light pudding (2 carb choices)   DIABETES MEAL PLANNING WORKSHEET Your dietician can use this worksheet to help you decide  how many servings of foods and what types of foods are right for you.   Breakfast Food Group and Servings Carb Choices Grain/Starches _______________________________________ Dairy ______________________________________________ Vegetable _______________________________________ Fruit _______________________________________________ Meat _______________________________________________ Fat _____________________________________________ Lunch Food Group and Servings Carb Choices Grain/Starches ________________________________________ Dairy _______________________________________________ Fruit ________________________________________________ Meat ________________________________________________ Fat _____________________________________________ Dinner Food Group and Servings Carb Choices Grain/Starches ________________________________________ Dairy _______________________________________________ Fruit ________________________________________________ Meat ________________________________________________ Fat _____________________________________________ Snacks Food Group and Servings Carb Choices Grain/Starches ________________________________________ Dairy _______________________________________________ Vegetable ________________________________________ Fruit ________________________________________________ Meat ________________________________________________ Fat _____________________________________________ Daily Totals Starches _________________________  Vegetable __________________________ Fruit ______________________________ Dairy ______________________________ Meat ______________________________ Fat ________________________________   Document Released: 12/20/2004 Document Re-Released: 09/12/2009 [employer] Patient Information 2011 [place].

## 2010-06-25 NOTE — Progress Notes (Signed)
Subjective:    [name], male    DOB: 1965/10/01, 45 y.o.   MRN: [id number]  HPI  patient is a 45 year old male with past medical history of HIV (CD4 count 100 in 04/2010),  Diabetes mellitus type 2 which is very poorly controlled , hypertension who presents to the clinic today for the following:  1) DMII - Patient was evaluated in the emergency department on 06/22/2010 with a chief complaint of high blood sugar. He was found to have a blood sugar of greater than 600 at time of evaluation , he was therefore provided insulin therapy and monitored. Blood sugars were 310 at the time of discharge.   Today, pt indicates that he's checking blood sugars 1 time daily, on awakening. Reports blood sugars of 400-500s mg/dL. Currently taking Lantus 90 units qAM and Metformin 1000mg  BID, which he states he is taking regularly. 0 hypoglycemic episodes since last visit. However, pt confirms that he is drinking 2-3 x 2L soda daily. Polyuria, polydipsia, dry mouth, vision blurriness, fatigue, decreased energy. No chest pain, difficulty breathing, shortness of breath. Ophthalmology referral is pending at this time.   2) HTN - does not check blood pressure regularly at home. Currently taking Atenolol 50mg , Lisinopril 40mg , . Denies headaches, dizziness, lightheadedness, chest pain, shortness of breath.   3) Chronic pain - patient indicates chronic back and knee pain ongoing for > 6 month to 1 year. Pt followed by sports med and ortho with MRI (08/2009) showing mild degenerative changes, no ligamentous abnormality. Pt was seen by Dr. Derry Lory (ortho in 04/2010) who had started Celebrex and Percocet 5/325 mg (#20). Patient requesting Percocet today.     Review of Systems Per HPI.  Current Outpatient Medications Medication Sig  . aspirin 81 MG EC tablet Take 81 mg by mouth daily.    Marland Kitchen atazanavir (REYATAZ) 300 MG capsule Take 300 mg by mouth daily.    Marland Kitchen atenolol (TENORMIN) 50 MG tablet Take 50 mg by  mouth daily.    . dapsone 100 MG tablet Take 100 mg by mouth daily.    Marland Kitchen emtricitabine-tenofovir (TRUVADA) 200-300 MG per tablet Take 1 tablet by mouth daily.    Marland Kitchen glucose blood (ACCU-CHEK AVIVA PLUS) test strip Use to check blood sugar twice a day - morning and evening before eating- when you give the Lantus insulin   . insulin glargine (LANTUS SOLOSTAR) 100 UNIT/ML injection Inject 90 Units into the skin daily.    . Insulin Pen Needle (PEN NEEDLES) 31G X 6 MM MISC Use to inject insulin one time a day   . Lancets (ACCU-CHEK MULTICLIX) lancets Use to check blood sugar up to 2 times daily   . lisinopril (PRINIVIL,ZESTRIL) 40 MG tablet Take 40 mg by mouth daily.    . metFORMIN (GLUCOPHAGE) 1000 MG tablet Take 1,000 mg by mouth 2 (two) times daily.    . ritonavir (NORVIR) 100 MG TABS Take 100 mg by mouth daily.    Marland Kitchen testosterone (ANDROGEL) 50 MG/5GM GEL Place onto the skin daily. Apply one packet every day   . zidovudine (RETROVIR) 300 MG tablet Take 300 mg by mouth 2 (two) times daily.    . meloxicam (MOBIC) 15 MG tablet Take 15 mg by mouth daily.    . traMADol (ULTRAM) 50 MG tablet Take 50-100 mg by mouth every 6 (six) hours as needed. For pain    Allergies Sulfonamide derivatives  Past Medical History  Diagnosis Date  . HIV (human immunodeficiency virus  infection)     CD4 count 100, VL 13800 (05/01/2010)  . Diabetes type 2, uncontrolled     HgA1c 17.6 (04/27/2010)  . Hypertension   . Genital warts   . Erectile dysfunction   . Chronic knee pain     right  . Osteomyelitis     h/o hand    History reviewed. No pertinent past surgical history.     Objective:   Physical Exam General: Vital signs reviewed and noted. Well-developed,well-nourished,in no acute distress; alert,appropriate and cooperative throughout examination. Head: normocephalic, atraumatic. Neck: No deformities, masses, or tenderness noted. Lungs: Normal respiratory effort. Clear to auscultation BL without crackles  or wheezes.  Heart: RRR. S1 and S2 normal without gallop, murmur, or rubs.  Abdomen: BS normoactive. Soft, Nondistended, non-tender.  No masses or organomegaly. Extremities: No pretibial edema. No knee swelling, warmth. No tenderness to palpation over entire spine or paraspinal musculature.        Assessment & Plan:  Case and plan of care discussed with Dr. Alphia Moh.

## 2010-06-26 LAB — CBC
HCT: 33.8 % — ABNORMAL LOW (ref 39.0–52.0)
HCT: 35.8 % — ABNORMAL LOW (ref 39.0–52.0)
HCT: 35.8 % — ABNORMAL LOW (ref 39.0–52.0)
HCT: 31.8 % — ABNORMAL LOW (ref 39.0–52.0)
HCT: 33.4 % — ABNORMAL LOW (ref 39.0–52.0)
HCT: 35 % — ABNORMAL LOW (ref 39.0–52.0)
HCT: 35.4 % — ABNORMAL LOW (ref 39.0–52.0)
Hemoglobin: 11.4 g/dL — ABNORMAL LOW (ref 13.0–17.0)
Hemoglobin: 11.8 g/dL — ABNORMAL LOW (ref 13.0–17.0)
Hemoglobin: 12.6 g/dL — ABNORMAL LOW (ref 13.0–17.0)
Hemoglobin: 11 g/dL — ABNORMAL LOW (ref 13.0–17.0)
Hemoglobin: 11.3 g/dL — ABNORMAL LOW (ref 13.0–17.0)
Hemoglobin: 11.8 g/dL — ABNORMAL LOW (ref 13.0–17.0)
Hemoglobin: 12.2 g/dL — ABNORMAL LOW (ref 13.0–17.0)
MCHC: 33 g/dL (ref 30.0–36.0)
MCHC: 33.7 g/dL (ref 30.0–36.0)
MCHC: 35.1 g/dL (ref 30.0–36.0)
MCHC: 33.6 g/dL (ref 30.0–36.0)
MCHC: 33.9 g/dL (ref 30.0–36.0)
MCHC: 34.4 g/dL (ref 30.0–36.0)
MCHC: 34.7 g/dL (ref 30.0–36.0)
MCV: 81.8 fL (ref 78.0–100.0)
MCV: 81.9 fL (ref 78.0–100.0)
MCV: 82.6 fL (ref 78.0–100.0)
MCV: 81.8 fL (ref 78.0–100.0)
MCV: 82 fL (ref 78.0–100.0)
MCV: 82.4 fL (ref 78.0–100.0)
MCV: 82.6 fL (ref 78.0–100.0)
Platelets: 152 10*3/uL (ref 150–400)
Platelets: 172 10*3/uL (ref 150–400)
Platelets: 193 10*3/uL (ref 150–400)
Platelets: 205 10*3/uL (ref 150–400)
Platelets: 223 10*3/uL (ref 150–400)
Platelets: 233 10*3/uL (ref 150–400)
Platelets: 275 10*3/uL (ref 150–400)
RBC: 4.13 MIL/uL — ABNORMAL LOW (ref 4.22–5.81)
RBC: 4.34 MIL/uL (ref 4.22–5.81)
RBC: 4.37 MIL/uL (ref 4.22–5.81)
RBC: 3.85 MIL/uL — ABNORMAL LOW (ref 4.22–5.81)
RBC: 4.08 MIL/uL — ABNORMAL LOW (ref 4.22–5.81)
RBC: 4.24 MIL/uL (ref 4.22–5.81)
RBC: 4.32 MIL/uL (ref 4.22–5.81)
RDW: 13 % (ref 11.5–15.5)
RDW: 13.3 % (ref 11.5–15.5)
RDW: 13.8 % (ref 11.5–15.5)
RDW: 13.2 % (ref 11.5–15.5)
RDW: 13.4 % (ref 11.5–15.5)
RDW: 13.5 % (ref 11.5–15.5)
RDW: 13.6 % (ref 11.5–15.5)
WBC: 4.9 10*3/uL (ref 4.0–10.5)
WBC: 6.3 10*3/uL (ref 4.0–10.5)
WBC: 7.3 10*3/uL (ref 4.0–10.5)
WBC: 4.9 10*3/uL (ref 4.0–10.5)
WBC: 6 10*3/uL (ref 4.0–10.5)
WBC: 6.3 10*3/uL (ref 4.0–10.5)
WBC: 6.5 10*3/uL (ref 4.0–10.5)

## 2010-06-26 LAB — GLUCOSE, CAPILLARY
Glucose-Capillary: 133 mg/dL — ABNORMAL HIGH (ref 70–99)
Glucose-Capillary: 166 mg/dL — ABNORMAL HIGH (ref 70–99)
Glucose-Capillary: 168 mg/dL — ABNORMAL HIGH (ref 70–99)
Glucose-Capillary: 171 mg/dL — ABNORMAL HIGH (ref 70–99)
Glucose-Capillary: 177 mg/dL — ABNORMAL HIGH (ref 70–99)
Glucose-Capillary: 216 mg/dL — ABNORMAL HIGH (ref 70–99)
Glucose-Capillary: 245 mg/dL — ABNORMAL HIGH (ref 70–99)
Glucose-Capillary: 248 mg/dL — ABNORMAL HIGH (ref 70–99)
Glucose-Capillary: 252 mg/dL — ABNORMAL HIGH (ref 70–99)
Glucose-Capillary: 256 mg/dL — ABNORMAL HIGH (ref 70–99)
Glucose-Capillary: 256 mg/dL — ABNORMAL HIGH (ref 70–99)
Glucose-Capillary: 270 mg/dL — ABNORMAL HIGH (ref 70–99)
Glucose-Capillary: 293 mg/dL — ABNORMAL HIGH (ref 70–99)
Glucose-Capillary: 309 mg/dL — ABNORMAL HIGH (ref 70–99)
Glucose-Capillary: 329 mg/dL — ABNORMAL HIGH (ref 70–99)
Glucose-Capillary: 338 mg/dL — ABNORMAL HIGH (ref 70–99)
Glucose-Capillary: 342 mg/dL — ABNORMAL HIGH (ref 70–99)
Glucose-Capillary: 373 mg/dL — ABNORMAL HIGH (ref 70–99)
Glucose-Capillary: 404 mg/dL — ABNORMAL HIGH (ref 70–99)
Glucose-Capillary: 410 mg/dL — ABNORMAL HIGH (ref 70–99)
Glucose-Capillary: 423 mg/dL — ABNORMAL HIGH (ref 70–99)
Glucose-Capillary: 454 mg/dL — ABNORMAL HIGH (ref 70–99)
Glucose-Capillary: 543 mg/dL — ABNORMAL HIGH (ref 70–99)
Glucose-Capillary: >600 mg/dL (ref 70–99)
Glucose-Capillary: 136 mg/dL — ABNORMAL HIGH (ref 70–99)
Glucose-Capillary: 137 mg/dL — ABNORMAL HIGH (ref 70–99)
Glucose-Capillary: 147 mg/dL — ABNORMAL HIGH (ref 70–99)
Glucose-Capillary: 155 mg/dL — ABNORMAL HIGH (ref 70–99)
Glucose-Capillary: 179 mg/dL — ABNORMAL HIGH (ref 70–99)
Glucose-Capillary: 192 mg/dL — ABNORMAL HIGH (ref 70–99)
Glucose-Capillary: 215 mg/dL — ABNORMAL HIGH (ref 70–99)
Glucose-Capillary: 231 mg/dL — ABNORMAL HIGH (ref 70–99)
Glucose-Capillary: 237 mg/dL — ABNORMAL HIGH (ref 70–99)
Glucose-Capillary: 241 mg/dL — ABNORMAL HIGH (ref 70–99)
Glucose-Capillary: 252 mg/dL — ABNORMAL HIGH (ref 70–99)
Glucose-Capillary: 252 mg/dL — ABNORMAL HIGH (ref 70–99)
Glucose-Capillary: 259 mg/dL — ABNORMAL HIGH (ref 70–99)
Glucose-Capillary: 261 mg/dL — ABNORMAL HIGH (ref 70–99)
Glucose-Capillary: 275 mg/dL — ABNORMAL HIGH (ref 70–99)
Glucose-Capillary: 283 mg/dL — ABNORMAL HIGH (ref 70–99)
Glucose-Capillary: 290 mg/dL — ABNORMAL HIGH (ref 70–99)
Glucose-Capillary: 306 mg/dL — ABNORMAL HIGH (ref 70–99)
Glucose-Capillary: 324 mg/dL — ABNORMAL HIGH (ref 70–99)
Glucose-Capillary: 332 mg/dL — ABNORMAL HIGH (ref 70–99)
Glucose-Capillary: 352 mg/dL — ABNORMAL HIGH (ref 70–99)
Glucose-Capillary: 355 mg/dL — ABNORMAL HIGH (ref 70–99)
Glucose-Capillary: 357 mg/dL — ABNORMAL HIGH (ref 70–99)
Glucose-Capillary: 392 mg/dL — ABNORMAL HIGH (ref 70–99)
Glucose-Capillary: 405 mg/dL — ABNORMAL HIGH (ref 70–99)
Glucose-Capillary: 406 mg/dL — ABNORMAL HIGH (ref 70–99)
Glucose-Capillary: 410 mg/dL — ABNORMAL HIGH (ref 70–99)
Glucose-Capillary: 414 mg/dL — ABNORMAL HIGH (ref 70–99)
Glucose-Capillary: 499 mg/dL — ABNORMAL HIGH (ref 70–99)
Glucose-Capillary: 561 mg/dL (ref 70–99)
Glucose-Capillary: >600 mg/dL (ref 70–99)

## 2010-06-26 LAB — DIFFERENTIAL
Basophils Absolute: 0 10*3/uL (ref 0.0–0.1)
Basophils Absolute: 0 10*3/uL (ref 0.0–0.1)
Basophils Relative: 0 % (ref 0–1)
Basophils Relative: 0 % (ref 0–1)
Eosinophils Absolute: 0.3 10*3/uL (ref 0.0–0.7)
Eosinophils Absolute: 0 10*3/uL (ref 0.0–0.7)
Eosinophils Relative: 4 % (ref 0–5)
Eosinophils Relative: 1 % (ref 0–5)
Lymphocytes Relative: 26 % (ref 12–46)
Lymphocytes Relative: 33 % (ref 12–46)
Lymphs Abs: 1.9 10*3/uL (ref 0.7–4.0)
Lymphs Abs: 2.1 10*3/uL (ref 0.7–4.0)
Monocytes Absolute: 0.5 10*3/uL (ref 0.1–1.0)
Monocytes Absolute: 0.4 10*3/uL (ref 0.1–1.0)
Monocytes Relative: 7 % (ref 3–12)
Monocytes Relative: 6 % (ref 3–12)
Neutro Abs: 4.7 10*3/uL (ref 1.7–7.7)
Neutro Abs: 3.9 10*3/uL (ref 1.7–7.7)
Neutrophils Relative %: 63 % (ref 43–77)
Neutrophils Relative %: 60 % (ref 43–77)

## 2010-06-26 LAB — RAPID URINE DRUG SCREEN, HOSP PERFORMED
Amphetamines: NOT DETECTED
Barbiturates: NOT DETECTED
Benzodiazepines: NOT DETECTED
Cocaine: NOT DETECTED
Opiates: NOT DETECTED
Tetrahydrocannabinol: NOT DETECTED

## 2010-06-26 LAB — BASIC METABOLIC PANEL
BUN: 12 mg/dL (ref 6–23)
BUN: 15 mg/dL (ref 6–23)
BUN: 16 mg/dL (ref 6–23)
BUN: 18 mg/dL (ref 6–23)
BUN: 24 mg/dL — ABNORMAL HIGH (ref 6–23)
BUN: 11 mg/dL (ref 6–23)
BUN: 12 mg/dL (ref 6–23)
BUN: 15 mg/dL (ref 6–23)
BUN: 17 mg/dL (ref 6–23)
BUN: 21 mg/dL (ref 6–23)
BUN: 21 mg/dL (ref 6–23)
CO2: 27 mEq/L (ref 19–32)
CO2: 27 mEq/L (ref 19–32)
CO2: 29 mEq/L (ref 19–32)
CO2: 29 mEq/L (ref 19–32)
CO2: 30 mEq/L (ref 19–32)
CO2: 25 mEq/L (ref 19–32)
CO2: 26 mEq/L (ref 19–32)
CO2: 27 mEq/L (ref 19–32)
CO2: 27 mEq/L (ref 19–32)
CO2: 27 mEq/L (ref 19–32)
CO2: 29 mEq/L (ref 19–32)
Calcium: 8.4 mg/dL (ref 8.4–10.5)
Calcium: 8.8 mg/dL (ref 8.4–10.5)
Calcium: 8.9 mg/dL (ref 8.4–10.5)
Calcium: 9.3 mg/dL (ref 8.4–10.5)
Calcium: 9.5 mg/dL (ref 8.4–10.5)
Calcium: 9.2 mg/dL (ref 8.4–10.5)
Calcium: 9.2 mg/dL (ref 8.4–10.5)
Calcium: 9.2 mg/dL (ref 8.4–10.5)
Calcium: 9.3 mg/dL (ref 8.4–10.5)
Calcium: 9.3 mg/dL (ref 8.4–10.5)
Calcium: 9.4 mg/dL (ref 8.4–10.5)
Chloride: 89 mEq/L — ABNORMAL LOW (ref 96–112)
Chloride: 94 mEq/L — ABNORMAL LOW (ref 96–112)
Chloride: 95 mEq/L — ABNORMAL LOW (ref 96–112)
Chloride: 97 mEq/L (ref 96–112)
Chloride: 98 mEq/L (ref 96–112)
Chloride: 100 mEq/L (ref 96–112)
Chloride: 101 mEq/L (ref 96–112)
Chloride: 101 mEq/L (ref 96–112)
Chloride: 93 mEq/L — ABNORMAL LOW (ref 96–112)
Chloride: 94 mEq/L — ABNORMAL LOW (ref 96–112)
Chloride: 96 mEq/L (ref 96–112)
Creatinine, Ser: 0.63 mg/dL (ref 0.4–1.5)
Creatinine, Ser: 0.68 mg/dL (ref 0.4–1.5)
Creatinine, Ser: 0.69 mg/dL (ref 0.4–1.5)
Creatinine, Ser: 0.69 mg/dL (ref 0.4–1.5)
Creatinine, Ser: 1.07 mg/dL (ref 0.4–1.5)
Creatinine, Ser: 0.82 mg/dL (ref 0.4–1.5)
Creatinine, Ser: 0.86 mg/dL (ref 0.4–1.5)
Creatinine, Ser: 0.96 mg/dL (ref 0.4–1.5)
Creatinine, Ser: 1.06 mg/dL (ref 0.4–1.5)
Creatinine, Ser: 1.13 mg/dL (ref 0.4–1.5)
Creatinine, Ser: 1.14 mg/dL (ref 0.4–1.5)
GFR calc Af Amer: >60 mL/min (ref >60)
GFR calc Af Amer: >60 mL/min (ref >60)
GFR calc Af Amer: >60 mL/min (ref >60)
GFR calc Af Amer: >60 mL/min (ref >60)
GFR calc Af Amer: >60 mL/min (ref >60)
GFR calc Af Amer: >60 mL/min (ref >60)
GFR calc Af Amer: >60 mL/min (ref >60)
GFR calc Af Amer: >60 mL/min (ref >60)
GFR calc Af Amer: >60 mL/min (ref >60)
GFR calc Af Amer: >60 mL/min (ref >60)
GFR calc Af Amer: >60 mL/min (ref >60)
GFR calc non Af Amer: >60 mL/min (ref >60)
GFR calc non Af Amer: >60 mL/min (ref >60)
GFR calc non Af Amer: >60 mL/min (ref >60)
GFR calc non Af Amer: >60 mL/min (ref >60)
GFR calc non Af Amer: >60 mL/min (ref >60)
GFR calc non Af Amer: >60 mL/min (ref >60)
GFR calc non Af Amer: >60 mL/min (ref >60)
GFR calc non Af Amer: >60 mL/min (ref >60)
GFR calc non Af Amer: >60 mL/min (ref >60)
GFR calc non Af Amer: >60 mL/min (ref >60)
GFR calc non Af Amer: >60 mL/min (ref >60)
Glucose, Bld: 143 mg/dL — ABNORMAL HIGH (ref 70–99)
Glucose, Bld: 220 mg/dL — ABNORMAL HIGH (ref 70–99)
Glucose, Bld: 249 mg/dL — ABNORMAL HIGH (ref 70–99)
Glucose, Bld: 275 mg/dL — ABNORMAL HIGH (ref 70–99)
Glucose, Bld: 671 mg/dL (ref 70–99)
Glucose, Bld: 246 mg/dL — ABNORMAL HIGH (ref 70–99)
Glucose, Bld: 271 mg/dL — ABNORMAL HIGH (ref 70–99)
Glucose, Bld: 272 mg/dL — ABNORMAL HIGH (ref 70–99)
Glucose, Bld: 295 mg/dL — ABNORMAL HIGH (ref 70–99)
Glucose, Bld: 307 mg/dL — ABNORMAL HIGH (ref 70–99)
Glucose, Bld: 540 mg/dL — ABNORMAL HIGH (ref 70–99)
Potassium: 3.5 mEq/L (ref 3.5–5.1)
Potassium: 3.5 mEq/L (ref 3.5–5.1)
Potassium: 3.6 mEq/L (ref 3.5–5.1)
Potassium: 3.7 mEq/L (ref 3.5–5.1)
Potassium: 3.8 mEq/L (ref 3.5–5.1)
Potassium: 3.5 mEq/L (ref 3.5–5.1)
Potassium: 3.8 mEq/L (ref 3.5–5.1)
Potassium: 3.9 mEq/L (ref 3.5–5.1)
Potassium: 4.1 mEq/L (ref 3.5–5.1)
Potassium: 4.1 mEq/L (ref 3.5–5.1)
Potassium: 4.1 mEq/L (ref 3.5–5.1)
Sodium: 125 mEq/L — ABNORMAL LOW (ref 135–145)
Sodium: 129 mEq/L — ABNORMAL LOW (ref 135–145)
Sodium: 130 mEq/L — ABNORMAL LOW (ref 135–145)
Sodium: 131 mEq/L — ABNORMAL LOW (ref 135–145)
Sodium: 133 mEq/L — ABNORMAL LOW (ref 135–145)
Sodium: 130 mEq/L — ABNORMAL LOW (ref 135–145)
Sodium: 132 mEq/L — ABNORMAL LOW (ref 135–145)
Sodium: 132 mEq/L — ABNORMAL LOW (ref 135–145)
Sodium: 133 mEq/L — ABNORMAL LOW (ref 135–145)
Sodium: 134 mEq/L — ABNORMAL LOW (ref 135–145)
Sodium: 134 mEq/L — ABNORMAL LOW (ref 135–145)

## 2010-06-26 LAB — HEMOGLOBIN A1C
Hgb A1c MFr Bld: 19.8 % — ABNORMAL HIGH (ref <5.7)
Hgb A1c MFr Bld: 18 % — ABNORMAL HIGH (ref <5.7)
Mean Plasma Glucose: 522 mg/dL — ABNORMAL HIGH (ref <117)
Mean Plasma Glucose: 470 mg/dL — ABNORMAL HIGH (ref <117)

## 2010-06-26 LAB — POCT I-STAT, CHEM 8
BUN: 20 mg/dL (ref 6–23)
Calcium, Ion: 1.23 mmol/L (ref 1.12–1.32)
Chloride: 100 mEq/L (ref 96–112)
Creatinine, Ser: 1.1 mg/dL (ref 0.4–1.5)
Glucose, Bld: 215 mg/dL — ABNORMAL HIGH (ref 70–99)
HCT: 41 % (ref 39.0–52.0)
Hemoglobin: 13.9 g/dL (ref 13.0–17.0)
Potassium: 3.5 mEq/L (ref 3.5–5.1)
Sodium: 137 mEq/L (ref 135–145)
TCO2: 25 mmol/L (ref 0–100)

## 2010-06-26 LAB — COMPREHENSIVE METABOLIC PANEL
ALT: 14 U/L (ref 0–53)
ALT: 17 U/L (ref 0–53)
AST: 18 U/L (ref 0–37)
AST: 19 U/L (ref 0–37)
Albumin: 3.1 g/dL — ABNORMAL LOW (ref 3.5–5.2)
Albumin: 3.8 g/dL (ref 3.5–5.2)
Alkaline Phosphatase: 133 U/L — ABNORMAL HIGH (ref 39–117)
Alkaline Phosphatase: 64 U/L (ref 39–117)
BUN: 22 mg/dL (ref 6–23)
BUN: 9 mg/dL (ref 6–23)
CO2: 26 mEq/L (ref 19–32)
CO2: 27 mEq/L (ref 19–32)
Calcium: 8.4 mg/dL (ref 8.4–10.5)
Calcium: 9.8 mg/dL (ref 8.4–10.5)
Chloride: 82 mEq/L — ABNORMAL LOW (ref 96–112)
Chloride: 97 mEq/L (ref 96–112)
Creatinine, Ser: 0.66 mg/dL (ref 0.4–1.5)
Creatinine, Ser: 1.35 mg/dL (ref 0.4–1.5)
GFR calc Af Amer: >60 mL/min (ref >60)
GFR calc Af Amer: >60 mL/min (ref >60)
GFR calc non Af Amer: 57 mL/min — ABNORMAL LOW (ref >60)
GFR calc non Af Amer: >60 mL/min (ref >60)
Glucose, Bld: 314 mg/dL — ABNORMAL HIGH (ref 70–99)
Glucose, Bld: 923 mg/dL (ref 70–99)
Potassium: 3.3 mEq/L — ABNORMAL LOW (ref 3.5–5.1)
Potassium: 4.9 mEq/L (ref 3.5–5.1)
Sodium: 120 mEq/L — ABNORMAL LOW (ref 135–145)
Sodium: 132 mEq/L — ABNORMAL LOW (ref 135–145)
Total Bilirubin: 0.8 mg/dL (ref 0.3–1.2)
Total Bilirubin: 0.9 mg/dL (ref 0.3–1.2)
Total Protein: 7.1 g/dL (ref 6.0–8.3)
Total Protein: 8.6 g/dL — ABNORMAL HIGH (ref 6.0–8.3)

## 2010-06-26 LAB — URINE MICROSCOPIC-ADD ON

## 2010-06-26 LAB — URINALYSIS, ROUTINE W REFLEX MICROSCOPIC
Bilirubin Urine: NEGATIVE
Glucose, UA: >1000 mg/dL — AB
Hgb urine dipstick: NEGATIVE
Ketones, ur: 15 mg/dL — AB
Leukocytes, UA: NEGATIVE
Nitrite: NEGATIVE
Protein, ur: NEGATIVE mg/dL
Specific Gravity, Urine: <1.005 — ABNORMAL LOW (ref 1.005–1.030)
Urobilinogen, UA: 0.2 mg/dL (ref 0.0–1.0)
pH: 5.5 (ref 5.0–8.0)

## 2010-06-26 LAB — URINE CULTURE
Colony Count: NO GROWTH
Culture: NO GROWTH

## 2010-06-26 LAB — LIPID PANEL
Cholesterol: 239 mg/dL — ABNORMAL HIGH (ref 0–200)
HDL: 41 mg/dL (ref >39)
LDL Cholesterol: UNDETERMINED mg/dL (ref 0–99)
Total CHOL/HDL Ratio: 5.8 RATIO
Triglycerides: 518 mg/dL — ABNORMAL HIGH (ref <150)
VLDL: UNDETERMINED mg/dL (ref 0–40)

## 2010-06-26 LAB — MAGNESIUM: Magnesium: 2 mg/dL (ref 1.5–2.5)

## 2010-06-26 LAB — CARDIAC PANEL(CRET KIN+CKTOT+MB+TROPI)
CK, MB: 1 ng/mL (ref 0.3–4.0)
CK, MB: 1.1 ng/mL (ref 0.3–4.0)
Relative Index: 0.6 (ref 0.0–2.5)
Relative Index: 0.8 (ref 0.0–2.5)
Total CK: 135 U/L (ref 7–232)
Total CK: 160 U/L (ref 7–232)
Troponin I: 0.01 ng/mL (ref 0.00–0.06)
Troponin I: <0.01 ng/mL (ref 0.00–0.06)

## 2010-06-26 LAB — C-REACTIVE PROTEIN: CRP: 0.4 mg/dL — ABNORMAL LOW (ref <0.6)

## 2010-06-26 LAB — CK TOTAL AND CKMB (NOT AT ARMC)
CK, MB: 1 ng/mL (ref 0.3–4.0)
Relative Index: 0.7 (ref 0.0–2.5)
Total CK: 147 U/L (ref 7–232)

## 2010-06-26 LAB — SEDIMENTATION RATE: Sed Rate: 80 mm/hr — ABNORMAL HIGH (ref 0–16)

## 2010-06-26 LAB — URIC ACID: Uric Acid, Serum: 6.3 mg/dL (ref 4.0–7.8)

## 2010-06-26 LAB — TROPONIN I: Troponin I: 0.01 ng/mL (ref 0.00–0.06)

## 2010-06-26 LAB — MRSA PCR SCREENING: MRSA by PCR: NEGATIVE

## 2010-06-26 LAB — TSH: TSH: 2.999 u[IU]/mL (ref 0.350–4.500)

## 2010-06-26 LAB — C-PEPTIDE: C-Peptide: 1.98 ng/mL (ref 0.80–3.90)

## 2010-06-26 NOTE — Assessment & Plan Note (Addendum)
Very poorly controlled. However, pt drinking 2-3 x 2liter sodas daily. States he is taking Lantus regularly.  - Instructed to first stop drinking soda - Will not adjust insulin as likely elevated blood sugars reflective of soda intake, and want to avoid hypoglycemia if stops sodas and Lantus increased. - Will have close follow up with Barnabas Harries and with clinic to reevaluate insulin needs after he discontinues the sodas. - Given handout regarding diabetes nutrition. - Instructed to check blood sugars 3-4 times daily, preprandial and at bedtime - Instructed to bring in glucometer to every visit for review.  - Ophthalmology referral placed (04/2010), but pending at this time.

## 2010-06-26 NOTE — Assessment & Plan Note (Signed)
Patient is being managed for this pain by both Dr. Nori Riis and Dr. Derry Lory. There are no new imaging studies available at this time to suggest acute pathology that should require narcotic pain medication, as last MRI (08/2009) only suggestive of mild degenerative disease. - No narcotics given - Recommended continue NSAIDS (with food) - Recommended follow-up with providers that are providing service for this pathology.

## 2010-06-26 NOTE — Assessment & Plan Note (Signed)
Well-controlled.  Continue current regimen. 

## 2010-06-27 LAB — CBC
HCT: 35.2 % — ABNORMAL LOW (ref 39.0–52.0)
HCT: 37.9 % — ABNORMAL LOW (ref 39.0–52.0)
HCT: 38.8 % — ABNORMAL LOW (ref 39.0–52.0)
HCT: 39.6 % (ref 39.0–52.0)
Hemoglobin: 11.5 g/dL — ABNORMAL LOW (ref 13.0–17.0)
Hemoglobin: 12.8 g/dL — ABNORMAL LOW (ref 13.0–17.0)
Hemoglobin: 12.9 g/dL — ABNORMAL LOW (ref 13.0–17.0)
Hemoglobin: 13.1 g/dL (ref 13.0–17.0)
MCHC: 32.8 g/dL (ref 30.0–36.0)
MCHC: 33.1 g/dL (ref 30.0–36.0)
MCHC: 33.2 g/dL (ref 30.0–36.0)
MCHC: 33.7 g/dL (ref 30.0–36.0)
MCV: 81.1 fL (ref 78.0–100.0)
MCV: 82.3 fL (ref 78.0–100.0)
MCV: 82.9 fL (ref 78.0–100.0)
MCV: 83 fL (ref 78.0–100.0)
Platelets: 226 10*3/uL (ref 150–400)
Platelets: 235 10*3/uL (ref 150–400)
Platelets: 236 10*3/uL (ref 150–400)
Platelets: 241 10*3/uL (ref 150–400)
RBC: 4.24 MIL/uL (ref 4.22–5.81)
RBC: 4.67 MIL/uL (ref 4.22–5.81)
RBC: 4.68 MIL/uL (ref 4.22–5.81)
RBC: 4.81 MIL/uL (ref 4.22–5.81)
RDW: 13.2 % (ref 11.5–15.5)
RDW: 13.4 % (ref 11.5–15.5)
RDW: 13.5 % (ref 11.5–15.5)
RDW: 13.5 % (ref 11.5–15.5)
WBC: 6.1 10*3/uL (ref 4.0–10.5)
WBC: 6.6 10*3/uL (ref 4.0–10.5)
WBC: 6.8 10*3/uL (ref 4.0–10.5)
WBC: 8.4 10*3/uL (ref 4.0–10.5)

## 2010-06-27 LAB — GLUCOSE, CAPILLARY
Glucose-Capillary: 105 mg/dL — ABNORMAL HIGH (ref 70–99)
Glucose-Capillary: 108 mg/dL — ABNORMAL HIGH (ref 70–99)
Glucose-Capillary: 111 mg/dL — ABNORMAL HIGH (ref 70–99)
Glucose-Capillary: 112 mg/dL — ABNORMAL HIGH (ref 70–99)
Glucose-Capillary: 119 mg/dL — ABNORMAL HIGH (ref 70–99)
Glucose-Capillary: 126 mg/dL — ABNORMAL HIGH (ref 70–99)
Glucose-Capillary: 130 mg/dL — ABNORMAL HIGH (ref 70–99)
Glucose-Capillary: 131 mg/dL — ABNORMAL HIGH (ref 70–99)
Glucose-Capillary: 133 mg/dL — ABNORMAL HIGH (ref 70–99)
Glucose-Capillary: 144 mg/dL — ABNORMAL HIGH (ref 70–99)
Glucose-Capillary: 161 mg/dL — ABNORMAL HIGH (ref 70–99)
Glucose-Capillary: 165 mg/dL — ABNORMAL HIGH (ref 70–99)
Glucose-Capillary: 167 mg/dL — ABNORMAL HIGH (ref 70–99)
Glucose-Capillary: 190 mg/dL — ABNORMAL HIGH (ref 70–99)
Glucose-Capillary: 192 mg/dL — ABNORMAL HIGH (ref 70–99)
Glucose-Capillary: 207 mg/dL — ABNORMAL HIGH (ref 70–99)
Glucose-Capillary: 216 mg/dL — ABNORMAL HIGH (ref 70–99)
Glucose-Capillary: 222 mg/dL — ABNORMAL HIGH (ref 70–99)
Glucose-Capillary: 231 mg/dL — ABNORMAL HIGH (ref 70–99)
Glucose-Capillary: 234 mg/dL — ABNORMAL HIGH (ref 70–99)
Glucose-Capillary: 245 mg/dL — ABNORMAL HIGH (ref 70–99)
Glucose-Capillary: 257 mg/dL — ABNORMAL HIGH (ref 70–99)
Glucose-Capillary: 278 mg/dL — ABNORMAL HIGH (ref 70–99)
Glucose-Capillary: 281 mg/dL — ABNORMAL HIGH (ref 70–99)
Glucose-Capillary: 283 mg/dL — ABNORMAL HIGH (ref 70–99)
Glucose-Capillary: 284 mg/dL — ABNORMAL HIGH (ref 70–99)
Glucose-Capillary: 288 mg/dL — ABNORMAL HIGH (ref 70–99)
Glucose-Capillary: 301 mg/dL — ABNORMAL HIGH (ref 70–99)
Glucose-Capillary: 302 mg/dL — ABNORMAL HIGH (ref 70–99)
Glucose-Capillary: 317 mg/dL — ABNORMAL HIGH (ref 70–99)
Glucose-Capillary: 385 mg/dL — ABNORMAL HIGH (ref 70–99)
Glucose-Capillary: 415 mg/dL — ABNORMAL HIGH (ref 70–99)
Glucose-Capillary: 416 mg/dL — ABNORMAL HIGH (ref 70–99)
Glucose-Capillary: 431 mg/dL — ABNORMAL HIGH (ref 70–99)
Glucose-Capillary: 446 mg/dL — ABNORMAL HIGH (ref 70–99)
Glucose-Capillary: 513 mg/dL — ABNORMAL HIGH (ref 70–99)
Glucose-Capillary: 534 mg/dL — ABNORMAL HIGH (ref 70–99)
Glucose-Capillary: 554 mg/dL (ref 70–99)
Glucose-Capillary: >600 mg/dL (ref 70–99)
Glucose-Capillary: >600 mg/dL (ref 70–99)
Glucose-Capillary: >600 mg/dL (ref 70–99)
Glucose-Capillary: >600 mg/dL (ref 70–99)
Glucose-Capillary: >600 mg/dL (ref 70–99)
Glucose-Capillary: >600 mg/dL (ref 70–99)

## 2010-06-27 LAB — POCT I-STAT 3, ART BLOOD GAS (G3+)
Acid-Base Excess: 2 mmol/L (ref 0.0–2.0)
Bicarbonate: 26.8 mEq/L — ABNORMAL HIGH (ref 20.0–24.0)
O2 Saturation: 88 %
TCO2: 28 mmol/L (ref 0–100)
pCO2 arterial: 43.4 mmHg (ref 35.0–45.0)
pH, Arterial: 7.399 (ref 7.350–7.450)
pO2, Arterial: 55 mmHg — ABNORMAL LOW (ref 80.0–100.0)

## 2010-06-27 LAB — BASIC METABOLIC PANEL
BUN: 10 mg/dL (ref 6–23)
BUN: 11 mg/dL (ref 6–23)
BUN: 14 mg/dL (ref 6–23)
BUN: 26 mg/dL — ABNORMAL HIGH (ref 6–23)
BUN: 7 mg/dL (ref 6–23)
CO2: 23 mEq/L (ref 19–32)
CO2: 23 mEq/L (ref 19–32)
CO2: 25 mEq/L (ref 19–32)
CO2: 26 mEq/L (ref 19–32)
CO2: 27 mEq/L (ref 19–32)
Calcium: 8.7 mg/dL (ref 8.4–10.5)
Calcium: 8.7 mg/dL (ref 8.4–10.5)
Calcium: 9.3 mg/dL (ref 8.4–10.5)
Calcium: 9.4 mg/dL (ref 8.4–10.5)
Calcium: 9.8 mg/dL (ref 8.4–10.5)
Chloride: 109 mEq/L (ref 96–112)
Chloride: 77 mEq/L — ABNORMAL LOW (ref 96–112)
Chloride: 85 mEq/L — ABNORMAL LOW (ref 96–112)
Chloride: 99 mEq/L (ref 96–112)
Chloride: 99 mEq/L (ref 96–112)
Creatinine, Ser: 0.7 mg/dL (ref 0.4–1.5)
Creatinine, Ser: 0.84 mg/dL (ref 0.4–1.5)
Creatinine, Ser: 0.94 mg/dL (ref 0.4–1.5)
Creatinine, Ser: 1.09 mg/dL (ref 0.4–1.5)
Creatinine, Ser: 1.54 mg/dL — ABNORMAL HIGH (ref 0.4–1.5)
GFR calc Af Amer: 60 mL/min — ABNORMAL LOW (ref >60)
GFR calc Af Amer: >60 mL/min (ref >60)
GFR calc Af Amer: >60 mL/min (ref >60)
GFR calc Af Amer: >60 mL/min (ref >60)
GFR calc Af Amer: >60 mL/min (ref >60)
GFR calc non Af Amer: 49 mL/min — ABNORMAL LOW (ref >60)
GFR calc non Af Amer: >60 mL/min (ref >60)
GFR calc non Af Amer: >60 mL/min (ref >60)
GFR calc non Af Amer: >60 mL/min (ref >60)
GFR calc non Af Amer: >60 mL/min (ref >60)
Glucose, Bld: 1110 mg/dL (ref 70–99)
Glucose, Bld: 118 mg/dL — ABNORMAL HIGH (ref 70–99)
Glucose, Bld: 162 mg/dL — ABNORMAL HIGH (ref 70–99)
Glucose, Bld: 361 mg/dL — ABNORMAL HIGH (ref 70–99)
Glucose, Bld: 615 mg/dL (ref 70–99)
Potassium: 3.2 mEq/L — ABNORMAL LOW (ref 3.5–5.1)
Potassium: 3.9 mEq/L (ref 3.5–5.1)
Potassium: 4 mEq/L (ref 3.5–5.1)
Potassium: 5 mEq/L (ref 3.5–5.1)
Potassium: 5.2 mEq/L — ABNORMAL HIGH (ref 3.5–5.1)
Sodium: 116 mEq/L — CL (ref 135–145)
Sodium: 119 mEq/L — CL (ref 135–145)
Sodium: 129 mEq/L — ABNORMAL LOW (ref 135–145)
Sodium: 135 mEq/L (ref 135–145)
Sodium: 138 mEq/L (ref 135–145)

## 2010-06-27 LAB — URINALYSIS, ROUTINE W REFLEX MICROSCOPIC
Bilirubin Urine: NEGATIVE
Glucose, UA: >1000 mg/dL — AB
Ketones, ur: NEGATIVE mg/dL
Leukocytes, UA: NEGATIVE
Nitrite: NEGATIVE
Protein, ur: NEGATIVE mg/dL
Specific Gravity, Urine: 1.025 (ref 1.005–1.030)
Urobilinogen, UA: 0.2 mg/dL (ref 0.0–1.0)
pH: 6.5 (ref 5.0–8.0)

## 2010-06-27 LAB — URINE MICROSCOPIC-ADD ON

## 2010-06-27 LAB — C-REACTIVE PROTEIN: CRP: 4.3 mg/dL — ABNORMAL HIGH (ref <0.6)

## 2010-06-27 LAB — DIFFERENTIAL
Basophils Absolute: 0 10*3/uL (ref 0.0–0.1)
Basophils Absolute: 0 10*3/uL (ref 0.0–0.1)
Basophils Absolute: 0 10*3/uL (ref 0.0–0.1)
Basophils Relative: 0 % (ref 0–1)
Basophils Relative: 0 % (ref 0–1)
Basophils Relative: 0 % (ref 0–1)
Eosinophils Absolute: 0 10*3/uL (ref 0.0–0.7)
Eosinophils Absolute: 0 10*3/uL (ref 0.0–0.7)
Eosinophils Absolute: 0 10*3/uL (ref 0.0–0.7)
Eosinophils Relative: 0 % (ref 0–5)
Eosinophils Relative: 0 % (ref 0–5)
Eosinophils Relative: 0 % (ref 0–5)
Lymphocytes Relative: 22 % (ref 12–46)
Lymphocytes Relative: 26 % (ref 12–46)
Lymphocytes Relative: 37 % (ref 12–46)
Lymphs Abs: 1.7 10*3/uL (ref 0.7–4.0)
Lymphs Abs: 1.9 10*3/uL (ref 0.7–4.0)
Lymphs Abs: 2.2 10*3/uL (ref 0.7–4.0)
Monocytes Absolute: 0.5 10*3/uL (ref 0.1–1.0)
Monocytes Absolute: 0.7 10*3/uL (ref 0.1–1.0)
Monocytes Absolute: 0.7 10*3/uL (ref 0.1–1.0)
Monocytes Relative: 11 % (ref 3–12)
Monocytes Relative: 7 % (ref 3–12)
Monocytes Relative: 8 % (ref 3–12)
Neutro Abs: 3.1 10*3/uL (ref 1.7–7.7)
Neutro Abs: 4.3 10*3/uL (ref 1.7–7.7)
Neutro Abs: 5.8 10*3/uL (ref 1.7–7.7)
Neutrophils Relative %: 51 % (ref 43–77)
Neutrophils Relative %: 66 % (ref 43–77)
Neutrophils Relative %: 69 % (ref 43–77)

## 2010-06-27 LAB — CULTURE, BLOOD (ROUTINE X 2)
Culture: NO GROWTH
Culture: NO GROWTH

## 2010-06-27 LAB — COMPREHENSIVE METABOLIC PANEL
ALT: 13 U/L (ref 0–53)
ALT: 14 U/L (ref 0–53)
AST: 17 U/L (ref 0–37)
AST: 20 U/L (ref 0–37)
Albumin: 2.8 g/dL — ABNORMAL LOW (ref 3.5–5.2)
Albumin: 2.9 g/dL — ABNORMAL LOW (ref 3.5–5.2)
Alkaline Phosphatase: 52 U/L (ref 39–117)
Alkaline Phosphatase: 58 U/L (ref 39–117)
BUN: 10 mg/dL (ref 6–23)
BUN: 6 mg/dL (ref 6–23)
CO2: 27 mEq/L (ref 19–32)
CO2: 27 mEq/L (ref 19–32)
Calcium: 8.7 mg/dL (ref 8.4–10.5)
Calcium: 9.4 mg/dL (ref 8.4–10.5)
Chloride: 104 mEq/L (ref 96–112)
Chloride: 104 mEq/L (ref 96–112)
Creatinine, Ser: 0.74 mg/dL (ref 0.4–1.5)
Creatinine, Ser: 0.98 mg/dL (ref 0.4–1.5)
GFR calc Af Amer: >60 mL/min (ref >60)
GFR calc Af Amer: >60 mL/min (ref >60)
GFR calc non Af Amer: >60 mL/min (ref >60)
GFR calc non Af Amer: >60 mL/min (ref >60)
Glucose, Bld: 105 mg/dL — ABNORMAL HIGH (ref 70–99)
Glucose, Bld: 213 mg/dL — ABNORMAL HIGH (ref 70–99)
Potassium: 3.6 mEq/L (ref 3.5–5.1)
Potassium: 3.8 mEq/L (ref 3.5–5.1)
Sodium: 134 mEq/L — ABNORMAL LOW (ref 135–145)
Sodium: 136 mEq/L (ref 135–145)
Total Bilirubin: 1.2 mg/dL (ref 0.3–1.2)
Total Bilirubin: 1.4 mg/dL — ABNORMAL HIGH (ref 0.3–1.2)
Total Protein: 7.1 g/dL (ref 6.0–8.3)
Total Protein: 7.1 g/dL (ref 6.0–8.3)

## 2010-06-27 LAB — CULTURE, ROUTINE-ABSCESS: Gram Stain: NONE SEEN

## 2010-06-27 LAB — POCT I-STAT, CHEM 8
BUN: 16 mg/dL (ref 6–23)
Calcium, Ion: 1.07 mmol/L — ABNORMAL LOW (ref 1.12–1.32)
Chloride: 92 mEq/L — ABNORMAL LOW (ref 96–112)
Creatinine, Ser: 0.9 mg/dL (ref 0.4–1.5)
Glucose, Bld: >700 mg/dL (ref 70–99)
HCT: 44 % (ref 39.0–52.0)
Hemoglobin: 15 g/dL (ref 13.0–17.0)
Potassium: 4.9 mEq/L (ref 3.5–5.1)
Sodium: 123 mEq/L — ABNORMAL LOW (ref 135–145)
TCO2: 25 mmol/L (ref 0–100)

## 2010-06-27 LAB — T-HELPER CELLS (CD4) COUNT (NOT AT ARMC)
CD4 % Helper T Cell: 9 % — ABNORMAL LOW (ref 33–55)
CD4 T Cell Abs: 160 uL — ABNORMAL LOW (ref 400–2700)

## 2010-06-27 LAB — KETONES, QUALITATIVE: Acetone, Bld: NEGATIVE

## 2010-06-27 LAB — TISSUE CULTURE: Gram Stain: NONE SEEN

## 2010-06-27 LAB — POCT I-STAT 3, VENOUS BLOOD GAS (G3P V)
Acid-Base Excess: 4 mmol/L — ABNORMAL HIGH (ref 0.0–2.0)
Bicarbonate: 28.2 mEq/L — ABNORMAL HIGH (ref 20.0–24.0)
O2 Saturation: 90 %
Patient temperature: 98.1
TCO2: 29 mmol/L (ref 0–100)
pCO2, Ven: 40.3 mmHg — ABNORMAL LOW (ref 45.0–50.0)
pH, Ven: 7.451 — ABNORMAL HIGH (ref 7.250–7.300)
pO2, Ven: 55 mmHg — ABNORMAL HIGH (ref 30.0–45.0)

## 2010-06-27 LAB — WOUND CULTURE

## 2010-06-27 LAB — HIV-1 GENOTYPR PLUS

## 2010-06-27 LAB — SEDIMENTATION RATE: Sed Rate: 105 mm/hr — ABNORMAL HIGH (ref 0–16)

## 2010-06-27 LAB — T-HELPER CELL (CD4) - (RCID CLINIC ONLY)
CD4 % Helper T Cell: 9 % — ABNORMAL LOW (ref 33–55)
CD4 T Cell Abs: 140 uL — ABNORMAL LOW (ref 400–2700)

## 2010-06-27 LAB — HIV-1 RNA ULTRAQUANT REFLEX TO GENTYP+
HIV 1 RNA Quant: 158 copies/mL — ABNORMAL HIGH (ref <48)
HIV-1 RNA Quant, Log: 2.2 {Log} — ABNORMAL HIGH (ref <1.68)

## 2010-06-28 ENCOUNTER — Ambulatory Visit: Payer: Medicaid Other | Admitting: Physical Therapy

## 2010-06-28 ENCOUNTER — Ambulatory Visit: Payer: Medicaid Other | Attending: Physical Medicine & Rehabilitation | Admitting: Rehabilitation

## 2010-06-29 LAB — GLUCOSE, CAPILLARY
Glucose-Capillary: 144 mg/dL — ABNORMAL HIGH (ref 70–99)
Glucose-Capillary: 150 mg/dL — ABNORMAL HIGH (ref 70–99)
Glucose-Capillary: 182 mg/dL — ABNORMAL HIGH (ref 70–99)
Glucose-Capillary: 209 mg/dL — ABNORMAL HIGH (ref 70–99)
Glucose-Capillary: 224 mg/dL — ABNORMAL HIGH (ref 70–99)
Glucose-Capillary: 225 mg/dL — ABNORMAL HIGH (ref 70–99)
Glucose-Capillary: 278 mg/dL — ABNORMAL HIGH (ref 70–99)
Glucose-Capillary: 285 mg/dL — ABNORMAL HIGH (ref 70–99)
Glucose-Capillary: 294 mg/dL — ABNORMAL HIGH (ref 70–99)
Glucose-Capillary: 302 mg/dL — ABNORMAL HIGH (ref 70–99)
Glucose-Capillary: 310 mg/dL — ABNORMAL HIGH (ref 70–99)
Glucose-Capillary: 340 mg/dL — ABNORMAL HIGH (ref 70–99)
Glucose-Capillary: 348 mg/dL — ABNORMAL HIGH (ref 70–99)
Glucose-Capillary: 351 mg/dL — ABNORMAL HIGH (ref 70–99)
Glucose-Capillary: 353 mg/dL — ABNORMAL HIGH (ref 70–99)
Glucose-Capillary: 363 mg/dL — ABNORMAL HIGH (ref 70–99)
Glucose-Capillary: 365 mg/dL — ABNORMAL HIGH (ref 70–99)
Glucose-Capillary: 383 mg/dL — ABNORMAL HIGH (ref 70–99)
Glucose-Capillary: 389 mg/dL — ABNORMAL HIGH (ref 70–99)
Glucose-Capillary: 391 mg/dL — ABNORMAL HIGH (ref 70–99)
Glucose-Capillary: 401 mg/dL — ABNORMAL HIGH (ref 70–99)
Glucose-Capillary: 417 mg/dL — ABNORMAL HIGH (ref 70–99)
Glucose-Capillary: 427 mg/dL — ABNORMAL HIGH (ref 70–99)
Glucose-Capillary: 428 mg/dL — ABNORMAL HIGH (ref 70–99)
Glucose-Capillary: 434 mg/dL — ABNORMAL HIGH (ref 70–99)
Glucose-Capillary: 527 mg/dL — ABNORMAL HIGH (ref 70–99)
Glucose-Capillary: >600 mg/dL (ref 70–99)
Glucose-Capillary: >600 mg/dL (ref 70–99)
Glucose-Capillary: >600 mg/dL (ref 70–99)
Glucose-Capillary: >600 mg/dL (ref 70–99)
Glucose-Capillary: >600 mg/dL (ref 70–99)

## 2010-06-29 LAB — T-HELPER CELLS (CD4) COUNT (NOT AT ARMC)
CD4 % Helper T Cell: 9 % — ABNORMAL LOW (ref 33–55)
CD4 T Cell Abs: 140 uL — ABNORMAL LOW (ref 400–2700)

## 2010-06-29 LAB — BASIC METABOLIC PANEL
BUN: 10 mg/dL (ref 6–23)
BUN: 10 mg/dL (ref 6–23)
BUN: 11 mg/dL (ref 6–23)
BUN: 11 mg/dL (ref 6–23)
BUN: 11 mg/dL (ref 6–23)
BUN: 12 mg/dL (ref 6–23)
BUN: 12 mg/dL (ref 6–23)
BUN: 12 mg/dL (ref 6–23)
BUN: 12 mg/dL (ref 6–23)
BUN: 13 mg/dL (ref 6–23)
BUN: 16 mg/dL (ref 6–23)
BUN: 7 mg/dL (ref 6–23)
BUN: 9 mg/dL (ref 6–23)
CO2: 22 mEq/L (ref 19–32)
CO2: 23 mEq/L (ref 19–32)
CO2: 25 mEq/L (ref 19–32)
CO2: 25 mEq/L (ref 19–32)
CO2: 25 mEq/L (ref 19–32)
CO2: 25 mEq/L (ref 19–32)
CO2: 26 mEq/L (ref 19–32)
CO2: 26 mEq/L (ref 19–32)
CO2: 26 mEq/L (ref 19–32)
CO2: 26 mEq/L (ref 19–32)
CO2: 27 mEq/L (ref 19–32)
CO2: 27 mEq/L (ref 19–32)
CO2: 29 mEq/L (ref 19–32)
Calcium: 8.7 mg/dL (ref 8.4–10.5)
Calcium: 8.8 mg/dL (ref 8.4–10.5)
Calcium: 8.9 mg/dL (ref 8.4–10.5)
Calcium: 8.9 mg/dL (ref 8.4–10.5)
Calcium: 9 mg/dL (ref 8.4–10.5)
Calcium: 9 mg/dL (ref 8.4–10.5)
Calcium: 9.2 mg/dL (ref 8.4–10.5)
Calcium: 9.4 mg/dL (ref 8.4–10.5)
Calcium: 9.5 mg/dL (ref 8.4–10.5)
Calcium: 9.5 mg/dL (ref 8.4–10.5)
Calcium: 9.5 mg/dL (ref 8.4–10.5)
Calcium: 9.6 mg/dL (ref 8.4–10.5)
Calcium: 9.6 mg/dL (ref 8.4–10.5)
Chloride: 101 mEq/L (ref 96–112)
Chloride: 102 mEq/L (ref 96–112)
Chloride: 84 mEq/L — ABNORMAL LOW (ref 96–112)
Chloride: 85 mEq/L — ABNORMAL LOW (ref 96–112)
Chloride: 87 mEq/L — ABNORMAL LOW (ref 96–112)
Chloride: 94 mEq/L — ABNORMAL LOW (ref 96–112)
Chloride: 96 mEq/L (ref 96–112)
Chloride: 97 mEq/L (ref 96–112)
Chloride: 97 mEq/L (ref 96–112)
Chloride: 97 mEq/L (ref 96–112)
Chloride: 99 mEq/L (ref 96–112)
Chloride: 99 mEq/L (ref 96–112)
Chloride: 99 mEq/L (ref 96–112)
Creatinine, Ser: 0.72 mg/dL (ref 0.4–1.5)
Creatinine, Ser: 0.75 mg/dL (ref 0.4–1.5)
Creatinine, Ser: 0.76 mg/dL (ref 0.4–1.5)
Creatinine, Ser: 0.76 mg/dL (ref 0.4–1.5)
Creatinine, Ser: 0.8 mg/dL (ref 0.4–1.5)
Creatinine, Ser: 0.81 mg/dL (ref 0.4–1.5)
Creatinine, Ser: 0.89 mg/dL (ref 0.4–1.5)
Creatinine, Ser: 0.91 mg/dL (ref 0.4–1.5)
Creatinine, Ser: 0.93 mg/dL (ref 0.4–1.5)
Creatinine, Ser: 0.94 mg/dL (ref 0.4–1.5)
Creatinine, Ser: 0.97 mg/dL (ref 0.4–1.5)
Creatinine, Ser: 1.03 mg/dL (ref 0.4–1.5)
Creatinine, Ser: 1.06 mg/dL (ref 0.4–1.5)
GFR calc Af Amer: >60 mL/min (ref >60)
GFR calc Af Amer: >60 mL/min (ref >60)
GFR calc Af Amer: >60 mL/min (ref >60)
GFR calc Af Amer: >60 mL/min (ref >60)
GFR calc Af Amer: >60 mL/min (ref >60)
GFR calc Af Amer: >60 mL/min (ref >60)
GFR calc Af Amer: >60 mL/min (ref >60)
GFR calc Af Amer: >60 mL/min (ref >60)
GFR calc Af Amer: >60 mL/min (ref >60)
GFR calc Af Amer: >60 mL/min (ref >60)
GFR calc Af Amer: >60 mL/min (ref >60)
GFR calc Af Amer: >60 mL/min (ref >60)
GFR calc Af Amer: >60 mL/min (ref >60)
GFR calc non Af Amer: >60 mL/min (ref >60)
GFR calc non Af Amer: >60 mL/min (ref >60)
GFR calc non Af Amer: >60 mL/min (ref >60)
GFR calc non Af Amer: >60 mL/min (ref >60)
GFR calc non Af Amer: >60 mL/min (ref >60)
GFR calc non Af Amer: >60 mL/min (ref >60)
GFR calc non Af Amer: >60 mL/min (ref >60)
GFR calc non Af Amer: >60 mL/min (ref >60)
GFR calc non Af Amer: >60 mL/min (ref >60)
GFR calc non Af Amer: >60 mL/min (ref >60)
GFR calc non Af Amer: >60 mL/min (ref >60)
GFR calc non Af Amer: >60 mL/min (ref >60)
GFR calc non Af Amer: >60 mL/min (ref >60)
Glucose, Bld: 1029 mg/dL (ref 70–99)
Glucose, Bld: 1035 mg/dL (ref 70–99)
Glucose, Bld: 151 mg/dL — ABNORMAL HIGH (ref 70–99)
Glucose, Bld: 264 mg/dL — ABNORMAL HIGH (ref 70–99)
Glucose, Bld: 281 mg/dL — ABNORMAL HIGH (ref 70–99)
Glucose, Bld: 337 mg/dL — ABNORMAL HIGH (ref 70–99)
Glucose, Bld: 367 mg/dL — ABNORMAL HIGH (ref 70–99)
Glucose, Bld: 393 mg/dL — ABNORMAL HIGH (ref 70–99)
Glucose, Bld: 398 mg/dL — ABNORMAL HIGH (ref 70–99)
Glucose, Bld: 454 mg/dL — ABNORMAL HIGH (ref 70–99)
Glucose, Bld: 465 mg/dL — ABNORMAL HIGH (ref 70–99)
Glucose, Bld: 536 mg/dL — ABNORMAL HIGH (ref 70–99)
Glucose, Bld: 753 mg/dL (ref 70–99)
Potassium: 3.6 mEq/L (ref 3.5–5.1)
Potassium: 3.8 mEq/L (ref 3.5–5.1)
Potassium: 3.8 mEq/L (ref 3.5–5.1)
Potassium: 3.9 mEq/L (ref 3.5–5.1)
Potassium: 3.9 mEq/L (ref 3.5–5.1)
Potassium: 3.9 mEq/L (ref 3.5–5.1)
Potassium: 4 mEq/L (ref 3.5–5.1)
Potassium: 4.1 mEq/L (ref 3.5–5.1)
Potassium: 4.4 mEq/L (ref 3.5–5.1)
Potassium: 4.4 mEq/L (ref 3.5–5.1)
Potassium: 4.7 mEq/L (ref 3.5–5.1)
Potassium: 4.9 mEq/L (ref 3.5–5.1)
Potassium: 5.3 mEq/L — ABNORMAL HIGH (ref 3.5–5.1)
Sodium: 119 mEq/L — CL (ref 135–145)
Sodium: 121 mEq/L — ABNORMAL LOW (ref 135–145)
Sodium: 121 mEq/L — ABNORMAL LOW (ref 135–145)
Sodium: 129 mEq/L — ABNORMAL LOW (ref 135–145)
Sodium: 130 mEq/L — ABNORMAL LOW (ref 135–145)
Sodium: 130 mEq/L — ABNORMAL LOW (ref 135–145)
Sodium: 130 mEq/L — ABNORMAL LOW (ref 135–145)
Sodium: 131 mEq/L — ABNORMAL LOW (ref 135–145)
Sodium: 131 mEq/L — ABNORMAL LOW (ref 135–145)
Sodium: 132 mEq/L — ABNORMAL LOW (ref 135–145)
Sodium: 133 mEq/L — ABNORMAL LOW (ref 135–145)
Sodium: 135 mEq/L (ref 135–145)
Sodium: 137 mEq/L (ref 135–145)

## 2010-06-29 LAB — RAPID URINE DRUG SCREEN, HOSP PERFORMED
Amphetamines: NOT DETECTED
Barbiturates: NOT DETECTED
Benzodiazepines: NOT DETECTED
Cocaine: NOT DETECTED
Opiates: NOT DETECTED
Tetrahydrocannabinol: NOT DETECTED

## 2010-06-29 LAB — CBC
HCT: 39.7 % (ref 39.0–52.0)
HCT: 39.8 % (ref 39.0–52.0)
HCT: 39.9 % (ref 39.0–52.0)
HCT: 43.8 % (ref 39.0–52.0)
Hemoglobin: 13.2 g/dL (ref 13.0–17.0)
Hemoglobin: 13.4 g/dL (ref 13.0–17.0)
Hemoglobin: 13.5 g/dL (ref 13.0–17.0)
Hemoglobin: 14.9 g/dL (ref 13.0–17.0)
MCHC: 33.2 g/dL (ref 30.0–36.0)
MCHC: 33.5 g/dL (ref 30.0–36.0)
MCHC: 33.9 g/dL (ref 30.0–36.0)
MCHC: 34 g/dL (ref 30.0–36.0)
MCV: 83.5 fL (ref 78.0–100.0)
MCV: 83.5 fL (ref 78.0–100.0)
MCV: 83.8 fL (ref 78.0–100.0)
MCV: 86 fL (ref 78.0–100.0)
Platelets: 170 10*3/uL (ref 150–400)
Platelets: 177 10*3/uL (ref 150–400)
Platelets: 179 10*3/uL (ref 150–400)
Platelets: 200 10*3/uL (ref 150–400)
RBC: 4.74 MIL/uL (ref 4.22–5.81)
RBC: 4.75 MIL/uL (ref 4.22–5.81)
RBC: 4.78 MIL/uL (ref 4.22–5.81)
RBC: 5.09 MIL/uL (ref 4.22–5.81)
RDW: 13.2 % (ref 11.5–15.5)
RDW: 13.6 % (ref 11.5–15.5)
RDW: 13.7 % (ref 11.5–15.5)
RDW: 13.7 % (ref 11.5–15.5)
WBC: 5.3 10*3/uL (ref 4.0–10.5)
WBC: 5.9 10*3/uL (ref 4.0–10.5)
WBC: 5.9 10*3/uL (ref 4.0–10.5)
WBC: 7.3 10*3/uL (ref 4.0–10.5)

## 2010-06-29 LAB — CK TOTAL AND CKMB (NOT AT ARMC)
CK, MB: 2.5 ng/mL (ref 0.3–4.0)
CK, MB: 2.8 ng/mL (ref 0.3–4.0)
Relative Index: 0.5 (ref 0.0–2.5)
Relative Index: 0.6 (ref 0.0–2.5)
Total CK: 475 U/L — ABNORMAL HIGH (ref 7–232)
Total CK: 543 U/L — ABNORMAL HIGH (ref 7–232)

## 2010-06-29 LAB — CULTURE, BLOOD (ROUTINE X 2)
Culture: NO GROWTH
Culture: NO GROWTH

## 2010-06-29 LAB — URINALYSIS, ROUTINE W REFLEX MICROSCOPIC
Bilirubin Urine: NEGATIVE
Bilirubin Urine: NEGATIVE
Glucose, UA: >1000 mg/dL — AB
Glucose, UA: >1000 mg/dL — AB
Hgb urine dipstick: NEGATIVE
Hgb urine dipstick: NEGATIVE
Ketones, ur: NEGATIVE mg/dL
Ketones, ur: NEGATIVE mg/dL
Leukocytes, UA: NEGATIVE
Leukocytes, UA: NEGATIVE
Nitrite: NEGATIVE
Nitrite: NEGATIVE
Protein, ur: NEGATIVE mg/dL
Protein, ur: NEGATIVE mg/dL
Specific Gravity, Urine: 1.025 (ref 1.005–1.030)
Specific Gravity, Urine: 1.029 (ref 1.005–1.030)
Urobilinogen, UA: 0.2 mg/dL (ref 0.0–1.0)
Urobilinogen, UA: 0.2 mg/dL (ref 0.0–1.0)
pH: 6.5 (ref 5.0–8.0)
pH: 7 (ref 5.0–8.0)

## 2010-06-29 LAB — DIFFERENTIAL
Basophils Absolute: 0 10*3/uL (ref 0.0–0.1)
Basophils Absolute: 0 10*3/uL (ref 0.0–0.1)
Basophils Relative: 0 % (ref 0–1)
Basophils Relative: 0 % (ref 0–1)
Eosinophils Absolute: 0 10*3/uL (ref 0.0–0.7)
Eosinophils Absolute: 0 10*3/uL (ref 0.0–0.7)
Eosinophils Relative: 1 % (ref 0–5)
Eosinophils Relative: 1 % (ref 0–5)
Lymphocytes Relative: 35 % (ref 12–46)
Lymphocytes Relative: 47 % — ABNORMAL HIGH (ref 12–46)
Lymphs Abs: 2.5 10*3/uL (ref 0.7–4.0)
Lymphs Abs: 2.6 10*3/uL (ref 0.7–4.0)
Monocytes Absolute: 0.3 10*3/uL (ref 0.1–1.0)
Monocytes Absolute: 0.4 10*3/uL (ref 0.1–1.0)
Monocytes Relative: 5 % (ref 3–12)
Monocytes Relative: 6 % (ref 3–12)
Neutro Abs: 2.5 10*3/uL (ref 1.7–7.7)
Neutro Abs: 4.2 10*3/uL (ref 1.7–7.7)
Neutrophils Relative %: 47 % (ref 43–77)
Neutrophils Relative %: 58 % (ref 43–77)

## 2010-06-29 LAB — URINE MICROSCOPIC-ADD ON

## 2010-06-29 LAB — TROPONIN I
Troponin I: 0.02 ng/mL (ref 0.00–0.06)
Troponin I: 0.03 ng/mL (ref 0.00–0.06)

## 2010-06-29 LAB — HEMOGLOBIN A1C
Hgb A1c MFr Bld: 13.6 % — ABNORMAL HIGH (ref 4.6–6.1)
Mean Plasma Glucose: 344 mg/dL

## 2010-06-29 LAB — POCT CARDIAC MARKERS
CKMB, poc: 2.6 ng/mL (ref 1.0–8.0)
Myoglobin, poc: 144 ng/mL (ref 12–200)
Troponin i, poc: <0.05 ng/mL (ref 0.00–0.09)

## 2010-06-29 LAB — HIV-1 RNA QUANT-NO REFLEX-BLD
HIV 1 RNA Quant: 22600 copies/mL — ABNORMAL HIGH (ref <48)
HIV-1 RNA Quant, Log: 4.35 {Log} — ABNORMAL HIGH (ref <1.68)

## 2010-06-29 LAB — HIV 1/2 CONFIRMATION
HIV-1 antibody: POSITIVE
HIV-2 Ab: UNDETERMINED

## 2010-06-29 LAB — OSMOLALITY: Osmolality: 305 mOsm/kg — ABNORMAL HIGH (ref 275–300)

## 2010-06-29 LAB — HEPATIC FUNCTION PANEL
ALT: 27 U/L (ref 0–53)
AST: 25 U/L (ref 0–37)
Albumin: 3.7 g/dL (ref 3.5–5.2)
Alkaline Phosphatase: 101 U/L (ref 39–117)
Bilirubin, Direct: <0.1 mg/dL (ref 0.0–0.3)
Total Bilirubin: 0.5 mg/dL (ref 0.3–1.2)
Total Protein: 7.6 g/dL (ref 6.0–8.3)

## 2010-06-29 LAB — KETONES, QUALITATIVE: Acetone, Bld: NEGATIVE

## 2010-06-29 LAB — HIV ANTIBODY (ROUTINE TESTING W REFLEX): HIV: REACTIVE — AB

## 2010-06-29 LAB — MAGNESIUM: Magnesium: 2.2 mg/dL (ref 1.5–2.5)

## 2010-06-29 LAB — MRSA PCR SCREENING: MRSA by PCR: NEGATIVE

## 2010-07-02 LAB — POCT I-STAT, CHEM 8
BUN: 24 mg/dL — ABNORMAL HIGH (ref 6–23)
BUN: 25 mg/dL — ABNORMAL HIGH (ref 6–23)
Calcium, Ion: 1.15 mmol/L (ref 1.12–1.32)
Calcium, Ion: 1.2 mmol/L (ref 1.12–1.32)
Chloride: 94 mEq/L — ABNORMAL LOW (ref 96–112)
Chloride: 97 mEq/L (ref 96–112)
Creatinine, Ser: 1 mg/dL (ref 0.4–1.5)
Creatinine, Ser: 1.2 mg/dL (ref 0.4–1.5)
Glucose, Bld: 515 mg/dL — ABNORMAL HIGH (ref 70–99)
Glucose, Bld: >700 mg/dL (ref 70–99)
HCT: 42 % (ref 39.0–52.0)
HCT: 46 % (ref 39.0–52.0)
Hemoglobin: 14.3 g/dL (ref 13.0–17.0)
Hemoglobin: 15.6 g/dL (ref 13.0–17.0)
Potassium: 3.8 mEq/L (ref 3.5–5.1)
Potassium: 4.8 mEq/L (ref 3.5–5.1)
Sodium: 124 mEq/L — ABNORMAL LOW (ref 135–145)
Sodium: 130 mEq/L — ABNORMAL LOW (ref 135–145)
TCO2: 26 mmol/L (ref 0–100)
TCO2: 28 mmol/L (ref 0–100)

## 2010-07-02 LAB — BASIC METABOLIC PANEL
BUN: 11 mg/dL (ref 6–23)
BUN: 13 mg/dL (ref 6–23)
BUN: 15 mg/dL (ref 6–23)
CO2: 24 mEq/L (ref 19–32)
CO2: 25 mEq/L (ref 19–32)
CO2: 25 mEq/L (ref 19–32)
Calcium: 8.4 mg/dL (ref 8.4–10.5)
Calcium: 8.5 mg/dL (ref 8.4–10.5)
Calcium: 8.8 mg/dL (ref 8.4–10.5)
Chloride: 100 mEq/L (ref 96–112)
Chloride: 106 mEq/L (ref 96–112)
Chloride: 99 mEq/L (ref 96–112)
Creatinine, Ser: 0.66 mg/dL (ref 0.4–1.5)
Creatinine, Ser: 0.72 mg/dL (ref 0.4–1.5)
Creatinine, Ser: 0.8 mg/dL (ref 0.4–1.5)
GFR calc Af Amer: >60 mL/min (ref >60)
GFR calc Af Amer: >60 mL/min (ref >60)
GFR calc Af Amer: >60 mL/min (ref >60)
GFR calc non Af Amer: >60 mL/min (ref >60)
GFR calc non Af Amer: >60 mL/min (ref >60)
GFR calc non Af Amer: >60 mL/min (ref >60)
Glucose, Bld: 209 mg/dL — ABNORMAL HIGH (ref 70–99)
Glucose, Bld: 306 mg/dL — ABNORMAL HIGH (ref 70–99)
Glucose, Bld: 313 mg/dL — ABNORMAL HIGH (ref 70–99)
Potassium: 3.6 mEq/L (ref 3.5–5.1)
Potassium: 3.8 mEq/L (ref 3.5–5.1)
Potassium: 4 mEq/L (ref 3.5–5.1)
Sodium: 132 mEq/L — ABNORMAL LOW (ref 135–145)
Sodium: 133 mEq/L — ABNORMAL LOW (ref 135–145)
Sodium: 137 mEq/L (ref 135–145)

## 2010-07-02 LAB — GLUCOSE, CAPILLARY
Glucose-Capillary: 156 mg/dL — ABNORMAL HIGH (ref 70–99)
Glucose-Capillary: 171 mg/dL — ABNORMAL HIGH (ref 70–99)
Glucose-Capillary: 183 mg/dL — ABNORMAL HIGH (ref 70–99)
Glucose-Capillary: 190 mg/dL — ABNORMAL HIGH (ref 70–99)
Glucose-Capillary: 251 mg/dL — ABNORMAL HIGH (ref 70–99)
Glucose-Capillary: 252 mg/dL — ABNORMAL HIGH (ref 70–99)
Glucose-Capillary: 263 mg/dL — ABNORMAL HIGH (ref 70–99)
Glucose-Capillary: 294 mg/dL — ABNORMAL HIGH (ref 70–99)
Glucose-Capillary: 295 mg/dL — ABNORMAL HIGH (ref 70–99)
Glucose-Capillary: 323 mg/dL — ABNORMAL HIGH (ref 70–99)
Glucose-Capillary: 324 mg/dL — ABNORMAL HIGH (ref 70–99)
Glucose-Capillary: 333 mg/dL — ABNORMAL HIGH (ref 70–99)
Glucose-Capillary: 345 mg/dL — ABNORMAL HIGH (ref 70–99)
Glucose-Capillary: 354 mg/dL — ABNORMAL HIGH (ref 70–99)
Glucose-Capillary: 368 mg/dL — ABNORMAL HIGH (ref 70–99)
Glucose-Capillary: 376 mg/dL — ABNORMAL HIGH (ref 70–99)
Glucose-Capillary: 376 mg/dL — ABNORMAL HIGH (ref 70–99)
Glucose-Capillary: 377 mg/dL — ABNORMAL HIGH (ref 70–99)
Glucose-Capillary: 428 mg/dL — ABNORMAL HIGH (ref 70–99)
Glucose-Capillary: >600 mg/dL (ref 70–99)

## 2010-07-02 LAB — COMPREHENSIVE METABOLIC PANEL
ALT: 22 U/L (ref 0–53)
AST: 26 U/L (ref 0–37)
Albumin: 4.1 g/dL (ref 3.5–5.2)
Alkaline Phosphatase: 109 U/L (ref 39–117)
BUN: 22 mg/dL (ref 6–23)
CO2: 25 mEq/L (ref 19–32)
Calcium: 9.9 mg/dL (ref 8.4–10.5)
Chloride: 86 mEq/L — ABNORMAL LOW (ref 96–112)
Creatinine, Ser: 1.29 mg/dL (ref 0.4–1.5)
GFR calc Af Amer: >60 mL/min (ref >60)
GFR calc non Af Amer: >60 mL/min (ref >60)
Glucose, Bld: 796 mg/dL (ref 70–99)
Potassium: 4.8 mEq/L (ref 3.5–5.1)
Sodium: 123 mEq/L — ABNORMAL LOW (ref 135–145)
Total Bilirubin: 1.4 mg/dL — ABNORMAL HIGH (ref 0.3–1.2)
Total Protein: 8.9 g/dL — ABNORMAL HIGH (ref 6.0–8.3)

## 2010-07-02 LAB — URINALYSIS, ROUTINE W REFLEX MICROSCOPIC
Bilirubin Urine: NEGATIVE
Glucose, UA: >1000 mg/dL — AB
Hgb urine dipstick: NEGATIVE
Ketones, ur: 15 mg/dL — AB
Leukocytes, UA: NEGATIVE
Nitrite: NEGATIVE
Protein, ur: NEGATIVE mg/dL
Specific Gravity, Urine: 1.028 (ref 1.005–1.030)
Urobilinogen, UA: 0.2 mg/dL (ref 0.0–1.0)
pH: 5.5 (ref 5.0–8.0)

## 2010-07-02 LAB — DIFFERENTIAL
Basophils Absolute: 0 10*3/uL (ref 0.0–0.1)
Basophils Relative: 0 % (ref 0–1)
Eosinophils Absolute: 0 10*3/uL (ref 0.0–0.7)
Eosinophils Relative: 1 % (ref 0–5)
Lymphocytes Relative: 34 % (ref 12–46)
Lymphs Abs: 2.5 10*3/uL (ref 0.7–4.0)
Monocytes Absolute: 0.5 10*3/uL (ref 0.1–1.0)
Monocytes Relative: 7 % (ref 3–12)
Neutro Abs: 4.3 10*3/uL (ref 1.7–7.7)
Neutrophils Relative %: 58 % (ref 43–77)

## 2010-07-02 LAB — CBC
HCT: 42.3 % (ref 39.0–52.0)
Hemoglobin: 14.3 g/dL (ref 13.0–17.0)
MCHC: 33.8 g/dL (ref 30.0–36.0)
MCV: 80.8 fL (ref 78.0–100.0)
Platelets: 216 10*3/uL (ref 150–400)
RBC: 5.24 MIL/uL (ref 4.22–5.81)
RDW: 13.1 % (ref 11.5–15.5)
WBC: 7.4 10*3/uL (ref 4.0–10.5)

## 2010-07-02 LAB — HEMOGLOBIN A1C
Hgb A1c MFr Bld: 17.8 % — ABNORMAL HIGH (ref 4.6–6.1)
Mean Plasma Glucose: 464 mg/dL

## 2010-07-02 LAB — CARDIAC PANEL(CRET KIN+CKTOT+MB+TROPI)
CK, MB: 1.3 ng/mL (ref 0.3–4.0)
CK, MB: 1.5 ng/mL (ref 0.3–4.0)
Relative Index: 0.6 (ref 0.0–2.5)
Relative Index: 0.6 (ref 0.0–2.5)
Total CK: 215 U/L (ref 7–232)
Total CK: 250 U/L — ABNORMAL HIGH (ref 7–232)
Troponin I: 0.01 ng/mL (ref 0.00–0.06)
Troponin I: <0.01 ng/mL (ref 0.00–0.06)

## 2010-07-02 LAB — URINE MICROSCOPIC-ADD ON

## 2010-07-02 LAB — CK TOTAL AND CKMB (NOT AT ARMC)
CK, MB: 1.7 ng/mL (ref 0.3–4.0)
Relative Index: 0.7 (ref 0.0–2.5)
Total CK: 255 U/L — ABNORMAL HIGH (ref 7–232)

## 2010-07-02 LAB — TROPONIN I: Troponin I: 0.02 ng/mL (ref 0.00–0.06)

## 2010-07-02 LAB — MAGNESIUM: Magnesium: 1.9 mg/dL (ref 1.5–2.5)

## 2010-07-03 IMAGING — CR DG CHEST 2V
1 series · 1 of 1 positions shown · non-contrast
Comparison: 07/10/2009

CLINICAL DATA: Diabetic ketoacidosis.  Lethargic.

CHEST - 2 VIEW

[view not recorded]
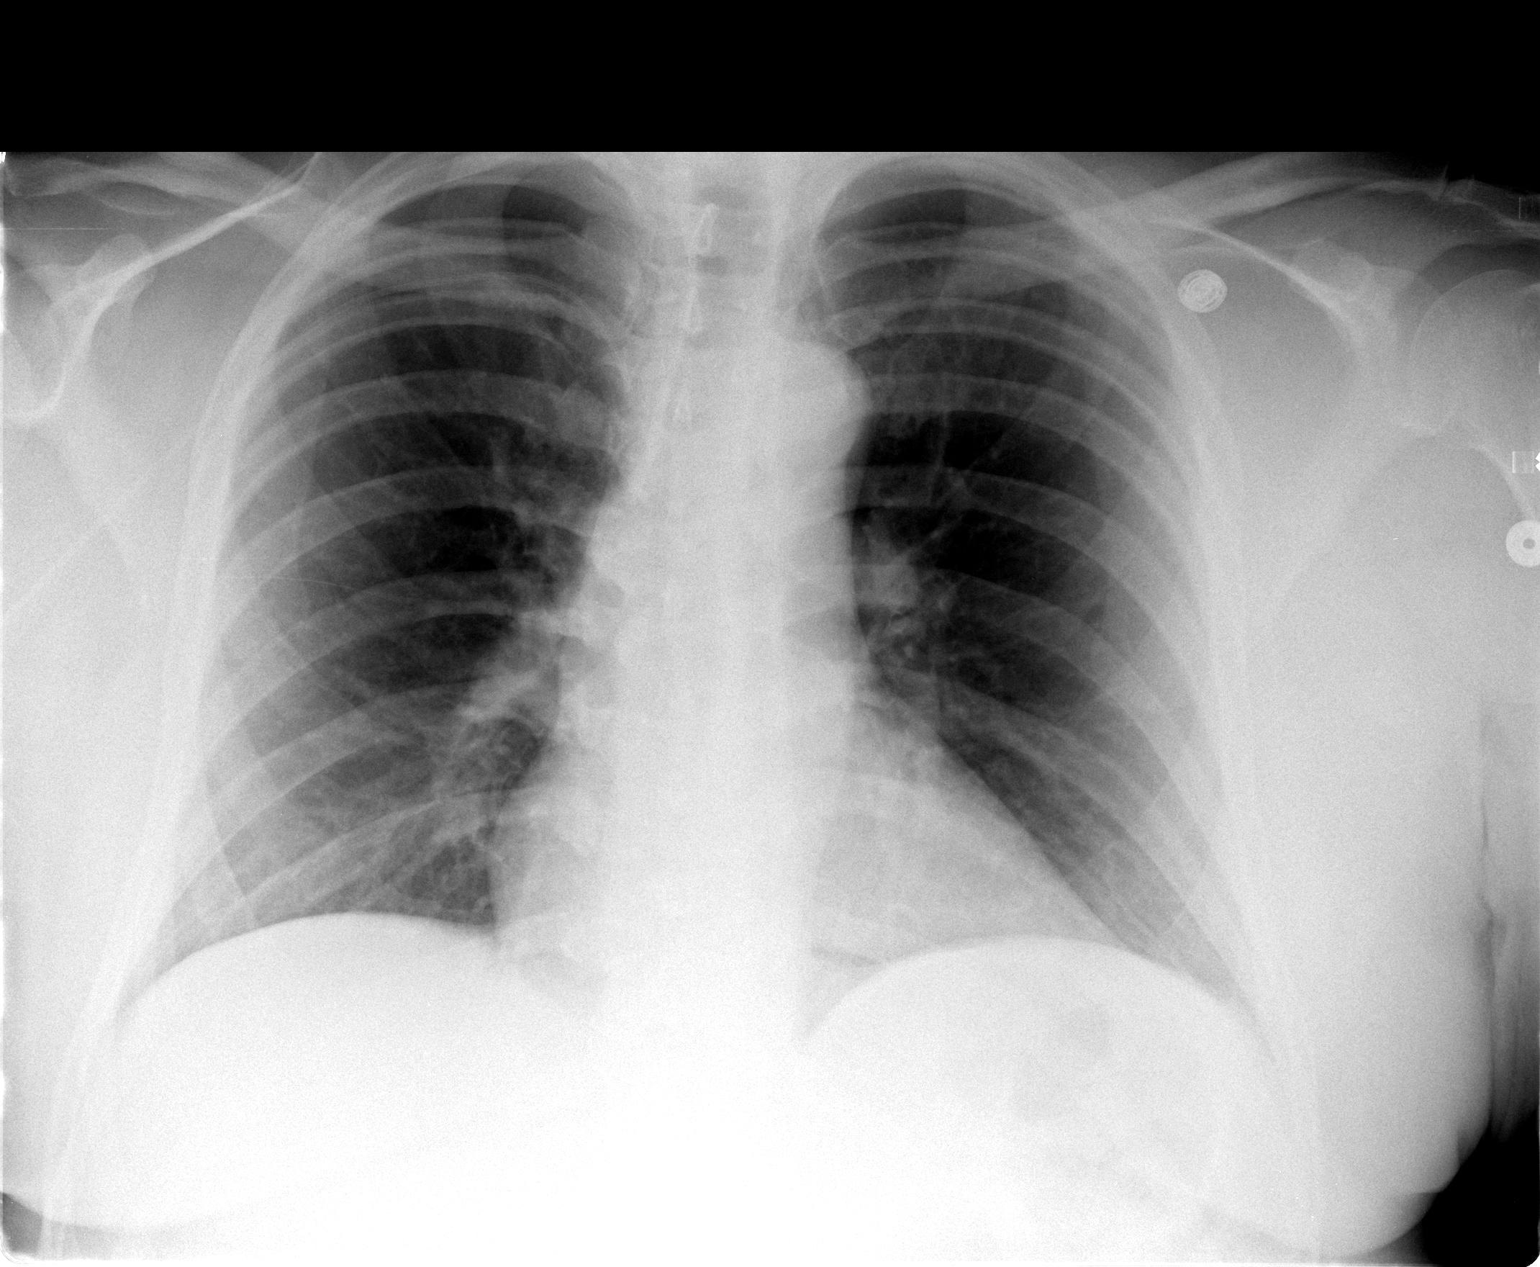

[1 of 1 positions shown; findings below may reference images not displayed]

FINDINGS: The right PICC line is stable.  The cardiac silhouette,
mediastinal and hilar contours are within normal limits.  The lungs
are clear.  The bony thorax is intact.
IMPRESSION: 1.  Stable right PICC line.
2.  No acute cardiopulmonary findings.

## 2010-07-04 IMAGING — MR MR [PERSON_NAME] LOW JT W/O CM*L*
4 of 5 series · 19 of 40 positions shown · non-contrast
Comparison: None

CLINICAL DATA: Left knee pain.

MRI LEFT KNEE WITHOUT CONTRAST
TECHNIQUE: Multiplanar, multisequence MR imaging of the left knee
was performed.  No intravenous contrast was administered.

[Series 2: PD fat-sat · axial · 4.0mm · 0.33mm/px · z∈[-34,+101]mm · 9 of 28 slices shown (1 of 3)]
[im 1/28]
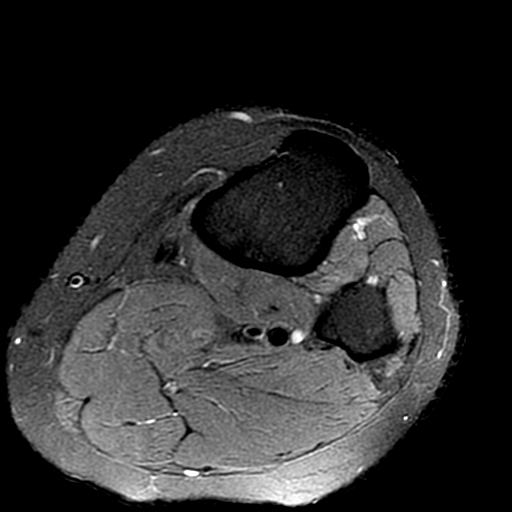
[im 4/28]
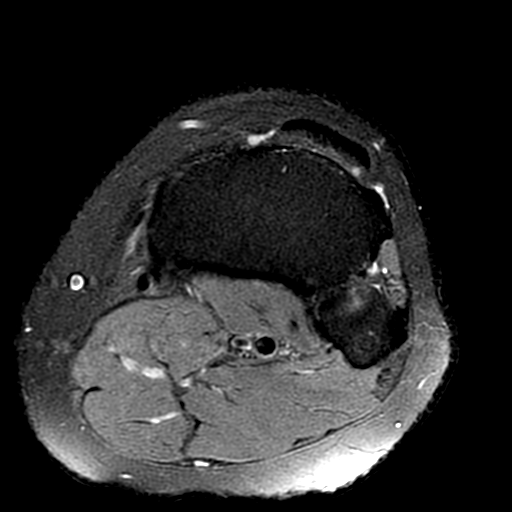
[im 7/28]
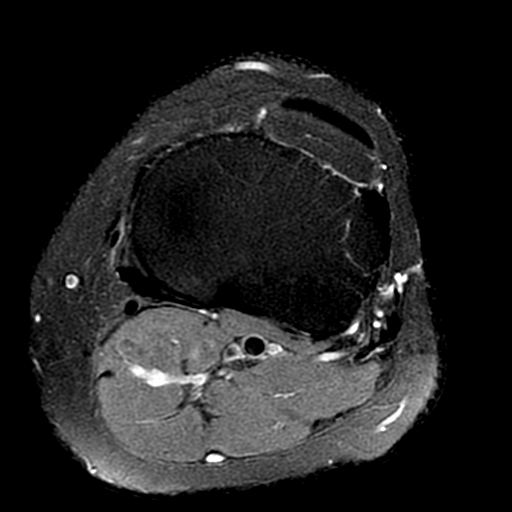
[im 11/28]
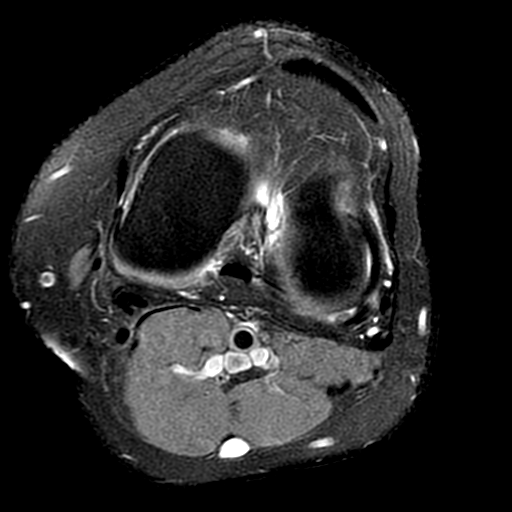
[im 14/28]
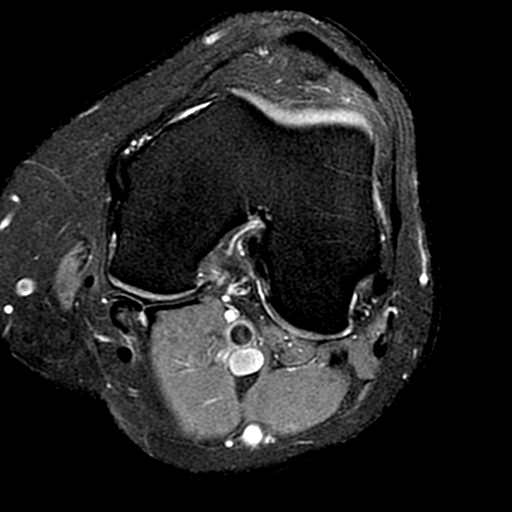
[im 17/28]
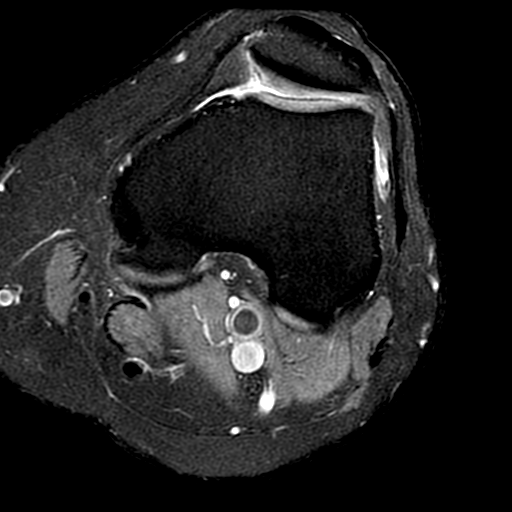
[im 21/28]
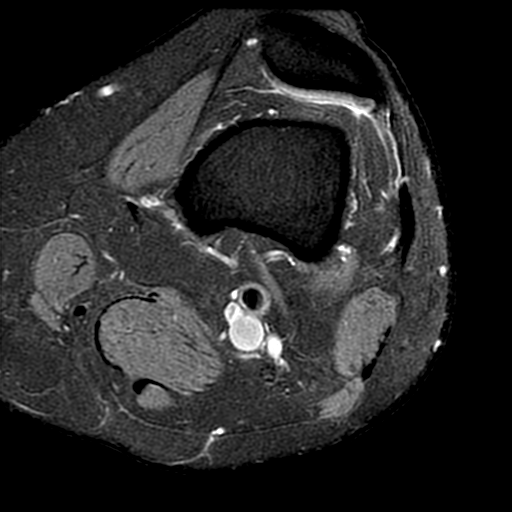
[im 24/28]
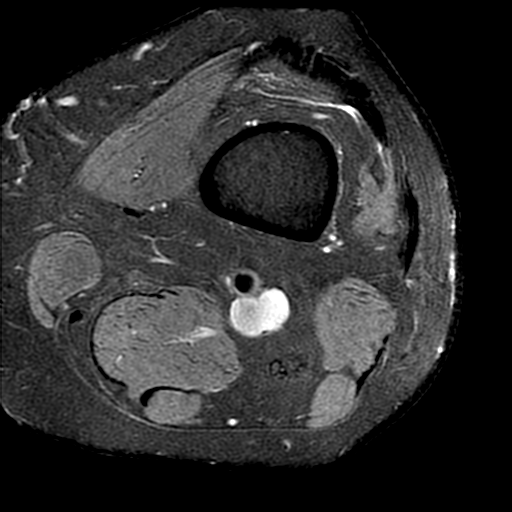
[im 28/28]
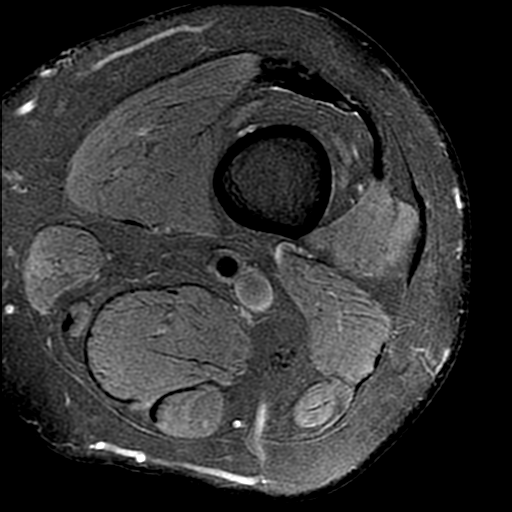

[Series 4: T2 fat-sat · coronal · 4.0mm · 0.33mm/px · 3 of 30 slices shown]
[im 5/30]
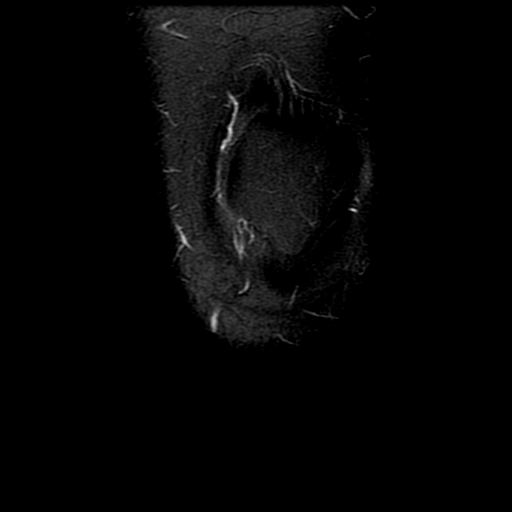
[im 17/30]
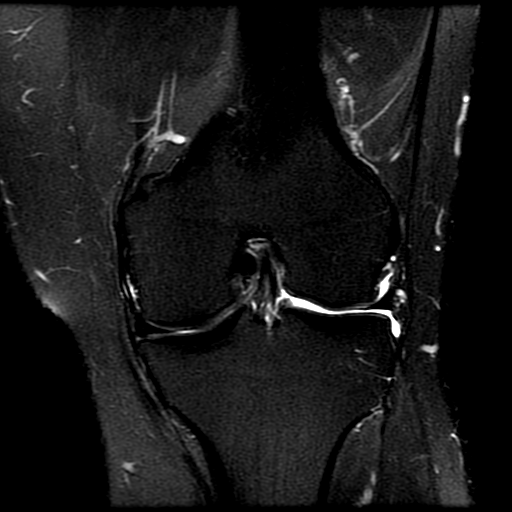
[im 25/30]
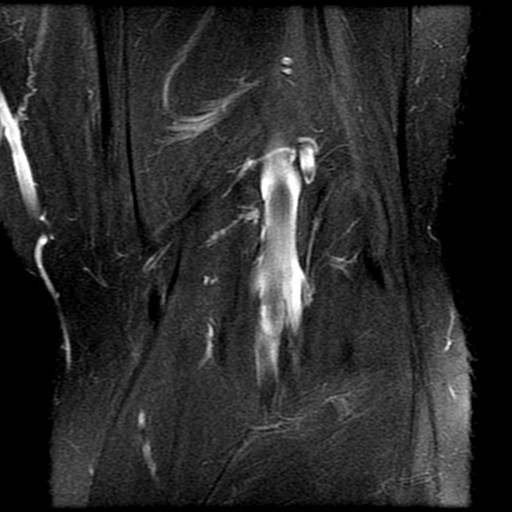

[Series 5: PD fat-sat · coronal · 4.0mm · 0.33mm/px · 4 of 30 slices shown (2 of 3)]
[im 1/30]
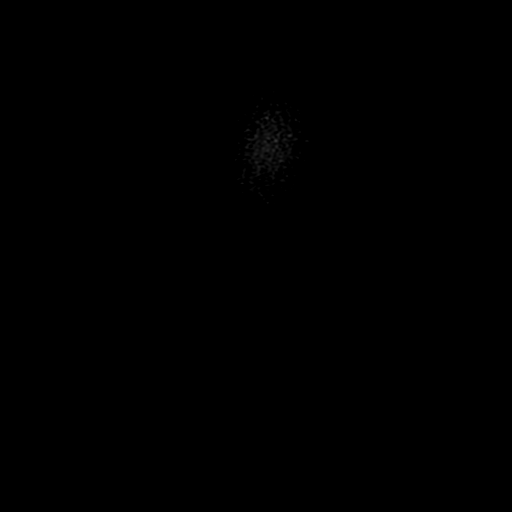
[im 5/30]
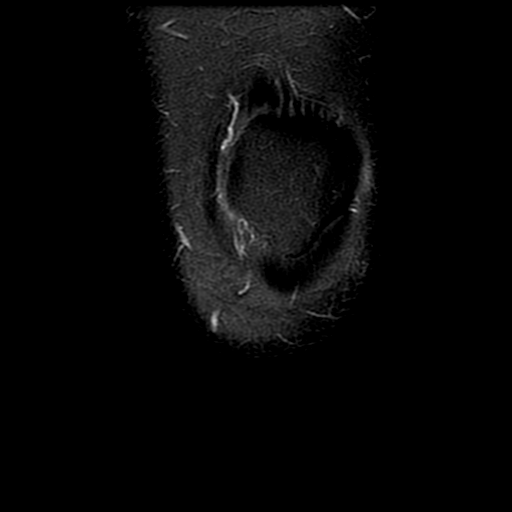
[im 17/30]
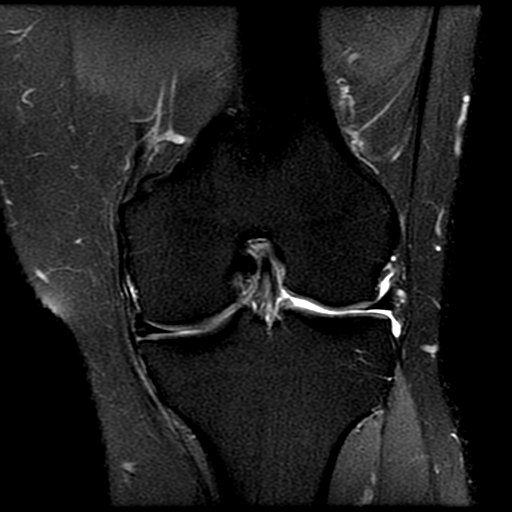
[im 25/30]
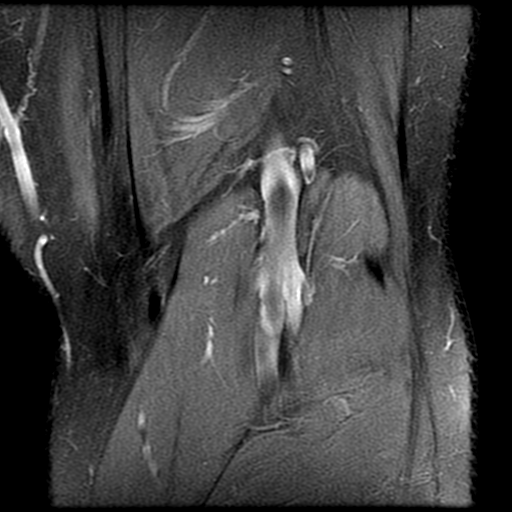

[Series 6: PD fat-sat · sagittal · 4.0mm · 0.33mm/px · 3 of 25 slices shown (3 of 3)]
[im 5/25]
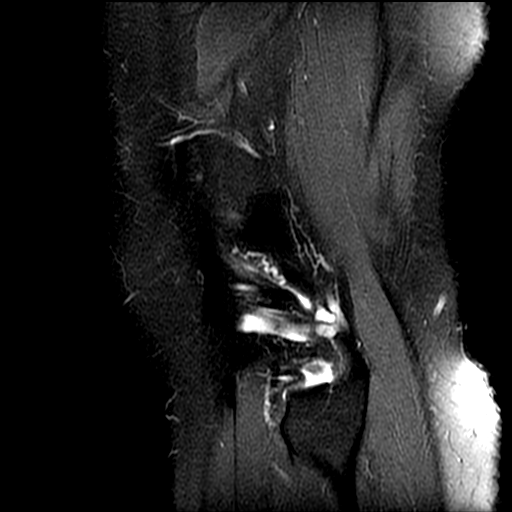
[im 13/25]
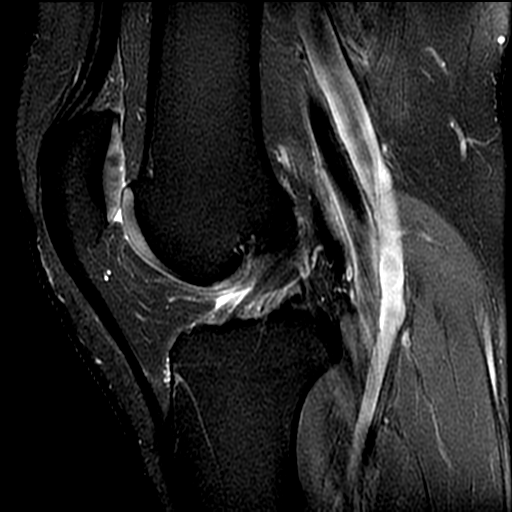
[im 21/25]
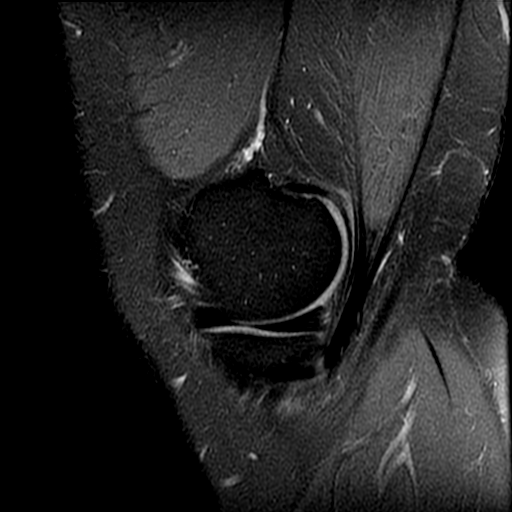

[19 of 40 positions shown; findings below may reference images not displayed]

FINDINGS: There are minimal tricompartmental degenerative changes
with early joint space narrowing and degenerative chondrosis.  No
cartilage defects or osteochondral lesions.

The cruciate and collateral ligaments are intact.

The menisci demonstrate normal morphology.  No meniscal tears.

The patella retinacular structures are patent.  Mild lateral tilt
of the patella.  The quadriceps and patellar tendons are intact.
IMPRESSION: 1.  Minimal tricompartmental degenerative changes.
2.  Intact ligamentous structures and no acute bony findings.
3.  No meniscal tears.
4.  No joint effusion or Baker's cyst.

## 2010-07-05 NOTE — Progress Notes (Signed)
   Phone Note Call from Patient   Summary of Call: walked in asking for pain meds.he went to pain clinic once & sports med once. told him we cannot give him any pain meds. all must come thru his pain doctor.  told him to call & make another appt.  also wants to go to an assisted living place. states he would be able to get his sugars under control if he could live elsewhere. currently has a room on [address]. upset that they will not do surgery on him until his sugars are under control. I explained how dangerous high sugars are & why he needed to be under control before they will operate on knee or back.  states he needs an appt here as well. walked him to front to make appt. he states he would call his pain clinic md Initial call taken by: Elige Radon RN,  June 25, 2010 11:54 AM

## 2010-07-06 ENCOUNTER — Encounter: Payer: Medicaid Other | Attending: Physical Medicine & Rehabilitation

## 2010-07-06 ENCOUNTER — Ambulatory Visit: Payer: Medicaid Other | Admitting: Physical Medicine & Rehabilitation

## 2010-07-09 LAB — GLUCOSE, CAPILLARY
Glucose-Capillary: 132 mg/dL — ABNORMAL HIGH (ref 70–99)
Glucose-Capillary: 148 mg/dL — ABNORMAL HIGH (ref 70–99)
Glucose-Capillary: 158 mg/dL — ABNORMAL HIGH (ref 70–99)
Glucose-Capillary: 162 mg/dL — ABNORMAL HIGH (ref 70–99)
Glucose-Capillary: 193 mg/dL — ABNORMAL HIGH (ref 70–99)
Glucose-Capillary: 223 mg/dL — ABNORMAL HIGH (ref 70–99)
Glucose-Capillary: 261 mg/dL — ABNORMAL HIGH (ref 70–99)
Glucose-Capillary: 273 mg/dL — ABNORMAL HIGH (ref 70–99)
Glucose-Capillary: 274 mg/dL — ABNORMAL HIGH (ref 70–99)
Glucose-Capillary: 304 mg/dL — ABNORMAL HIGH (ref 70–99)
Glucose-Capillary: 335 mg/dL — ABNORMAL HIGH (ref 70–99)

## 2010-07-09 LAB — URINALYSIS, ROUTINE W REFLEX MICROSCOPIC
Bilirubin Urine: NEGATIVE
Glucose, UA: >1000 mg/dL — AB
Hgb urine dipstick: NEGATIVE
Ketones, ur: NEGATIVE mg/dL
Leukocytes, UA: NEGATIVE
Nitrite: NEGATIVE
Protein, ur: NEGATIVE mg/dL
Specific Gravity, Urine: 1.029 (ref 1.005–1.030)
Urobilinogen, UA: 1 mg/dL (ref 0.0–1.0)
pH: 6 (ref 5.0–8.0)

## 2010-07-09 LAB — CULTURE, ROUTINE-ABSCESS

## 2010-07-09 LAB — URINE MICROSCOPIC-ADD ON

## 2010-07-09 LAB — POCT I-STAT, CHEM 8
BUN: 12 mg/dL (ref 6–23)
Calcium, Ion: 1.21 mmol/L (ref 1.12–1.32)
Chloride: 102 mEq/L (ref 96–112)
Creatinine, Ser: 0.8 mg/dL (ref 0.4–1.5)
Glucose, Bld: 394 mg/dL — ABNORMAL HIGH (ref 70–99)
HCT: 40 % (ref 39.0–52.0)
Hemoglobin: 13.6 g/dL (ref 13.0–17.0)
Potassium: 4.3 mEq/L (ref 3.5–5.1)
Sodium: 134 mEq/L — ABNORMAL LOW (ref 135–145)
TCO2: 25 mmol/L (ref 0–100)

## 2010-07-10 LAB — POCT I-STAT, CHEM 8
BUN: 17 mg/dL (ref 6–23)
Calcium, Ion: 1.18 mmol/L (ref 1.12–1.32)
Chloride: 99 mEq/L (ref 96–112)
Creatinine, Ser: 1 mg/dL (ref 0.4–1.5)
Glucose, Bld: 520 mg/dL (ref 70–99)
HCT: 46 % (ref 39.0–52.0)
Hemoglobin: 15.6 g/dL (ref 13.0–17.0)
Potassium: 4.1 mEq/L (ref 3.5–5.1)
Sodium: 131 mEq/L — ABNORMAL LOW (ref 135–145)
TCO2: 25 mmol/L (ref 0–100)

## 2010-07-10 LAB — HEMOGLOBIN A1C
Hgb A1c MFr Bld: 17.5 % — ABNORMAL HIGH (ref 4.6–6.1)
Mean Plasma Glucose: 456 mg/dL

## 2010-07-10 LAB — GLUCOSE, CAPILLARY
Glucose-Capillary: 128 mg/dL — ABNORMAL HIGH (ref 70–99)
Glucose-Capillary: 134 mg/dL — ABNORMAL HIGH (ref 70–99)
Glucose-Capillary: 134 mg/dL — ABNORMAL HIGH (ref 70–99)
Glucose-Capillary: 141 mg/dL — ABNORMAL HIGH (ref 70–99)
Glucose-Capillary: 147 mg/dL — ABNORMAL HIGH (ref 70–99)
Glucose-Capillary: 149 mg/dL — ABNORMAL HIGH (ref 70–99)
Glucose-Capillary: 151 mg/dL — ABNORMAL HIGH (ref 70–99)
Glucose-Capillary: 152 mg/dL — ABNORMAL HIGH (ref 70–99)
Glucose-Capillary: 155 mg/dL — ABNORMAL HIGH (ref 70–99)
Glucose-Capillary: 160 mg/dL — ABNORMAL HIGH (ref 70–99)
Glucose-Capillary: 161 mg/dL — ABNORMAL HIGH (ref 70–99)
Glucose-Capillary: 163 mg/dL — ABNORMAL HIGH (ref 70–99)
Glucose-Capillary: 167 mg/dL — ABNORMAL HIGH (ref 70–99)
Glucose-Capillary: 175 mg/dL — ABNORMAL HIGH (ref 70–99)
Glucose-Capillary: 184 mg/dL — ABNORMAL HIGH (ref 70–99)
Glucose-Capillary: 184 mg/dL — ABNORMAL HIGH (ref 70–99)
Glucose-Capillary: 193 mg/dL — ABNORMAL HIGH (ref 70–99)
Glucose-Capillary: 202 mg/dL — ABNORMAL HIGH (ref 70–99)
Glucose-Capillary: 218 mg/dL — ABNORMAL HIGH (ref 70–99)
Glucose-Capillary: 222 mg/dL — ABNORMAL HIGH (ref 70–99)
Glucose-Capillary: 227 mg/dL — ABNORMAL HIGH (ref 70–99)
Glucose-Capillary: 229 mg/dL — ABNORMAL HIGH (ref 70–99)
Glucose-Capillary: 232 mg/dL — ABNORMAL HIGH (ref 70–99)
Glucose-Capillary: 233 mg/dL — ABNORMAL HIGH (ref 70–99)
Glucose-Capillary: 249 mg/dL — ABNORMAL HIGH (ref 70–99)
Glucose-Capillary: 256 mg/dL — ABNORMAL HIGH (ref 70–99)
Glucose-Capillary: 256 mg/dL — ABNORMAL HIGH (ref 70–99)
Glucose-Capillary: 256 mg/dL — ABNORMAL HIGH (ref 70–99)
Glucose-Capillary: 286 mg/dL — ABNORMAL HIGH (ref 70–99)
Glucose-Capillary: 288 mg/dL — ABNORMAL HIGH (ref 70–99)
Glucose-Capillary: 291 mg/dL — ABNORMAL HIGH (ref 70–99)
Glucose-Capillary: 293 mg/dL — ABNORMAL HIGH (ref 70–99)
Glucose-Capillary: 294 mg/dL — ABNORMAL HIGH (ref 70–99)
Glucose-Capillary: 300 mg/dL — ABNORMAL HIGH (ref 70–99)
Glucose-Capillary: 318 mg/dL — ABNORMAL HIGH (ref 70–99)
Glucose-Capillary: 329 mg/dL — ABNORMAL HIGH (ref 70–99)
Glucose-Capillary: 337 mg/dL — ABNORMAL HIGH (ref 70–99)
Glucose-Capillary: 337 mg/dL — ABNORMAL HIGH (ref 70–99)
Glucose-Capillary: 423 mg/dL — ABNORMAL HIGH (ref 70–99)
Glucose-Capillary: 428 mg/dL — ABNORMAL HIGH (ref 70–99)
Glucose-Capillary: 485 mg/dL — ABNORMAL HIGH (ref 70–99)
Glucose-Capillary: 502 mg/dL (ref 70–99)
Glucose-Capillary: 74 mg/dL (ref 70–99)
Glucose-Capillary: 86 mg/dL (ref 70–99)
Glucose-Capillary: >600 mg/dL (ref 70–99)
Glucose-Capillary: >600 mg/dL (ref 70–99)

## 2010-07-10 LAB — BASIC METABOLIC PANEL
BUN: 10 mg/dL (ref 6–23)
BUN: 12 mg/dL (ref 6–23)
BUN: 15 mg/dL (ref 6–23)
BUN: 16 mg/dL (ref 6–23)
BUN: 17 mg/dL (ref 6–23)
BUN: 9 mg/dL (ref 6–23)
BUN: 9 mg/dL (ref 6–23)
CO2: 25 mEq/L (ref 19–32)
CO2: 26 mEq/L (ref 19–32)
CO2: 26 mEq/L (ref 19–32)
CO2: 27 mEq/L (ref 19–32)
CO2: 27 mEq/L (ref 19–32)
CO2: 27 mEq/L (ref 19–32)
CO2: 28 mEq/L (ref 19–32)
Calcium: 8.5 mg/dL (ref 8.4–10.5)
Calcium: 8.7 mg/dL (ref 8.4–10.5)
Calcium: 8.7 mg/dL (ref 8.4–10.5)
Calcium: 8.9 mg/dL (ref 8.4–10.5)
Calcium: 9.3 mg/dL (ref 8.4–10.5)
Calcium: 9.5 mg/dL (ref 8.4–10.5)
Calcium: 9.9 mg/dL (ref 8.4–10.5)
Chloride: 101 mEq/L (ref 96–112)
Chloride: 101 mEq/L (ref 96–112)
Chloride: 80 mEq/L — ABNORMAL LOW (ref 96–112)
Chloride: 94 mEq/L — ABNORMAL LOW (ref 96–112)
Chloride: 96 mEq/L (ref 96–112)
Chloride: 97 mEq/L (ref 96–112)
Chloride: 99 mEq/L (ref 96–112)
Creatinine, Ser: 0.76 mg/dL (ref 0.4–1.5)
Creatinine, Ser: 0.8 mg/dL (ref 0.4–1.5)
Creatinine, Ser: 0.9 mg/dL (ref 0.4–1.5)
Creatinine, Ser: 0.94 mg/dL (ref 0.4–1.5)
Creatinine, Ser: 0.96 mg/dL (ref 0.4–1.5)
Creatinine, Ser: 0.99 mg/dL (ref 0.4–1.5)
Creatinine, Ser: 1.38 mg/dL (ref 0.4–1.5)
GFR calc Af Amer: >60 mL/min (ref >60)
GFR calc Af Amer: >60 mL/min (ref >60)
GFR calc Af Amer: >60 mL/min (ref >60)
GFR calc Af Amer: >60 mL/min (ref >60)
GFR calc Af Amer: >60 mL/min (ref >60)
GFR calc Af Amer: >60 mL/min (ref >60)
GFR calc Af Amer: >60 mL/min (ref >60)
GFR calc non Af Amer: 56 mL/min — ABNORMAL LOW (ref >60)
GFR calc non Af Amer: >60 mL/min (ref >60)
GFR calc non Af Amer: >60 mL/min (ref >60)
GFR calc non Af Amer: >60 mL/min (ref >60)
GFR calc non Af Amer: >60 mL/min (ref >60)
GFR calc non Af Amer: >60 mL/min (ref >60)
GFR calc non Af Amer: >60 mL/min (ref >60)
Glucose, Bld: 1176 mg/dL (ref 70–99)
Glucose, Bld: 222 mg/dL — ABNORMAL HIGH (ref 70–99)
Glucose, Bld: 276 mg/dL — ABNORMAL HIGH (ref 70–99)
Glucose, Bld: 283 mg/dL — ABNORMAL HIGH (ref 70–99)
Glucose, Bld: 425 mg/dL — ABNORMAL HIGH (ref 70–99)
Glucose, Bld: 482 mg/dL — ABNORMAL HIGH (ref 70–99)
Glucose, Bld: 54 mg/dL — ABNORMAL LOW (ref 70–99)
Potassium: 3.6 mEq/L (ref 3.5–5.1)
Potassium: 3.8 mEq/L (ref 3.5–5.1)
Potassium: 3.9 mEq/L (ref 3.5–5.1)
Potassium: 3.9 mEq/L (ref 3.5–5.1)
Potassium: 4 mEq/L (ref 3.5–5.1)
Potassium: 4.3 mEq/L (ref 3.5–5.1)
Potassium: 5.8 mEq/L — ABNORMAL HIGH (ref 3.5–5.1)
Sodium: 117 mEq/L — CL (ref 135–145)
Sodium: 128 mEq/L — ABNORMAL LOW (ref 135–145)
Sodium: 131 mEq/L — ABNORMAL LOW (ref 135–145)
Sodium: 131 mEq/L — ABNORMAL LOW (ref 135–145)
Sodium: 132 mEq/L — ABNORMAL LOW (ref 135–145)
Sodium: 132 mEq/L — ABNORMAL LOW (ref 135–145)
Sodium: 138 mEq/L (ref 135–145)

## 2010-07-10 LAB — CBC
HCT: 39.3 % (ref 39.0–52.0)
Hemoglobin: 13.3 g/dL (ref 13.0–17.0)
MCHC: 33.7 g/dL (ref 30.0–36.0)
MCV: 82.7 fL (ref 78.0–100.0)
Platelets: 206 10*3/uL (ref 150–400)
RBC: 4.75 MIL/uL (ref 4.22–5.81)
RDW: 12.8 % (ref 11.5–15.5)
WBC: 9.4 10*3/uL (ref 4.0–10.5)

## 2010-07-10 LAB — POCT I-STAT 3, VENOUS BLOOD GAS (G3P V)
Acid-Base Excess: 1 mmol/L (ref 0.0–2.0)
Bicarbonate: 28.3 mEq/L — ABNORMAL HIGH (ref 20.0–24.0)
O2 Saturation: 63 %
TCO2: 30 mmol/L (ref 0–100)
pCO2, Ven: 50.8 mmHg — ABNORMAL HIGH (ref 45.0–50.0)
pH, Ven: 7.354 — ABNORMAL HIGH (ref 7.250–7.300)
pO2, Ven: 35 mmHg (ref 30.0–45.0)

## 2010-07-10 LAB — URINE MICROSCOPIC-ADD ON

## 2010-07-10 LAB — URINALYSIS, ROUTINE W REFLEX MICROSCOPIC
Bilirubin Urine: NEGATIVE
Glucose, UA: >1000 mg/dL — AB
Hgb urine dipstick: NEGATIVE
Ketones, ur: NEGATIVE mg/dL
Leukocytes, UA: NEGATIVE
Nitrite: NEGATIVE
Protein, ur: NEGATIVE mg/dL
Specific Gravity, Urine: 1.027 (ref 1.005–1.030)
Urobilinogen, UA: 0.2 mg/dL (ref 0.0–1.0)
pH: 6 (ref 5.0–8.0)

## 2010-07-10 LAB — C-PEPTIDE: C-Peptide: 0.31 ng/mL — ABNORMAL LOW (ref 0.80–3.90)

## 2010-07-10 LAB — GLUCOSE, RANDOM
Glucose, Bld: 387 mg/dL — ABNORMAL HIGH (ref 70–99)
Glucose, Bld: 492 mg/dL — ABNORMAL HIGH (ref 70–99)

## 2010-07-10 LAB — LIPID PANEL
Cholesterol: 202 mg/dL — ABNORMAL HIGH (ref 0–200)
HDL: 35 mg/dL — ABNORMAL LOW (ref >39)
LDL Cholesterol: 93 mg/dL (ref 0–99)
Total CHOL/HDL Ratio: 5.8 RATIO
Triglycerides: 371 mg/dL — ABNORMAL HIGH (ref <150)
VLDL: 74 mg/dL — ABNORMAL HIGH (ref 0–40)

## 2010-07-10 LAB — RAPID URINE DRUG SCREEN, HOSP PERFORMED
Amphetamines: NOT DETECTED
Barbiturates: NOT DETECTED
Benzodiazepines: NOT DETECTED
Cocaine: NOT DETECTED
Opiates: NOT DETECTED
Tetrahydrocannabinol: NOT DETECTED

## 2010-07-10 LAB — TSH: TSH: 1.211 u[IU]/mL (ref 0.350–4.500)

## 2010-07-10 LAB — KETONES, QUALITATIVE

## 2010-07-11 LAB — CK TOTAL AND CKMB (NOT AT ARMC)
CK, MB: 1.6 ng/mL (ref 0.3–4.0)
CK, MB: 1.6 ng/mL (ref 0.3–4.0)
CK, MB: 2.3 ng/mL (ref 0.3–4.0)
Relative Index: 0.4 (ref 0.0–2.5)
Relative Index: 0.4 (ref 0.0–2.5)
Relative Index: 0.5 (ref 0.0–2.5)
Total CK: 348 U/L — ABNORMAL HIGH (ref 7–232)
Total CK: 408 U/L — ABNORMAL HIGH (ref 7–232)
Total CK: 549 U/L — ABNORMAL HIGH (ref 7–232)

## 2010-07-11 LAB — GLUCOSE, CAPILLARY
Glucose-Capillary: 208 mg/dL — ABNORMAL HIGH (ref 70–99)
Glucose-Capillary: 251 mg/dL — ABNORMAL HIGH (ref 70–99)
Glucose-Capillary: 264 mg/dL — ABNORMAL HIGH (ref 70–99)
Glucose-Capillary: 284 mg/dL — ABNORMAL HIGH (ref 70–99)
Glucose-Capillary: 292 mg/dL — ABNORMAL HIGH (ref 70–99)
Glucose-Capillary: 300 mg/dL — ABNORMAL HIGH (ref 70–99)
Glucose-Capillary: 314 mg/dL — ABNORMAL HIGH (ref 70–99)
Glucose-Capillary: 323 mg/dL — ABNORMAL HIGH (ref 70–99)
Glucose-Capillary: 329 mg/dL — ABNORMAL HIGH (ref 70–99)
Glucose-Capillary: 352 mg/dL — ABNORMAL HIGH (ref 70–99)
Glucose-Capillary: 364 mg/dL — ABNORMAL HIGH (ref 70–99)
Glucose-Capillary: 368 mg/dL — ABNORMAL HIGH (ref 70–99)
Glucose-Capillary: 382 mg/dL — ABNORMAL HIGH (ref 70–99)
Glucose-Capillary: 403 mg/dL — ABNORMAL HIGH (ref 70–99)
Glucose-Capillary: 437 mg/dL — ABNORMAL HIGH (ref 70–99)
Glucose-Capillary: 576 mg/dL (ref 70–99)
Glucose-Capillary: 105 mg/dL — ABNORMAL HIGH (ref 70–99)
Glucose-Capillary: 151 mg/dL — ABNORMAL HIGH (ref 70–99)
Glucose-Capillary: 156 mg/dL — ABNORMAL HIGH (ref 70–99)
Glucose-Capillary: 167 mg/dL — ABNORMAL HIGH (ref 70–99)
Glucose-Capillary: 184 mg/dL — ABNORMAL HIGH (ref 70–99)
Glucose-Capillary: 193 mg/dL — ABNORMAL HIGH (ref 70–99)
Glucose-Capillary: 195 mg/dL — ABNORMAL HIGH (ref 70–99)
Glucose-Capillary: 219 mg/dL — ABNORMAL HIGH (ref 70–99)
Glucose-Capillary: 222 mg/dL — ABNORMAL HIGH (ref 70–99)
Glucose-Capillary: 231 mg/dL — ABNORMAL HIGH (ref 70–99)
Glucose-Capillary: 248 mg/dL — ABNORMAL HIGH (ref 70–99)
Glucose-Capillary: 258 mg/dL — ABNORMAL HIGH (ref 70–99)
Glucose-Capillary: 269 mg/dL — ABNORMAL HIGH (ref 70–99)
Glucose-Capillary: 271 mg/dL — ABNORMAL HIGH (ref 70–99)
Glucose-Capillary: 280 mg/dL — ABNORMAL HIGH (ref 70–99)
Glucose-Capillary: 286 mg/dL — ABNORMAL HIGH (ref 70–99)
Glucose-Capillary: 292 mg/dL — ABNORMAL HIGH (ref 70–99)
Glucose-Capillary: 299 mg/dL — ABNORMAL HIGH (ref 70–99)
Glucose-Capillary: 305 mg/dL — ABNORMAL HIGH (ref 70–99)
Glucose-Capillary: 308 mg/dL — ABNORMAL HIGH (ref 70–99)
Glucose-Capillary: 310 mg/dL — ABNORMAL HIGH (ref 70–99)
Glucose-Capillary: 317 mg/dL — ABNORMAL HIGH (ref 70–99)
Glucose-Capillary: 325 mg/dL — ABNORMAL HIGH (ref 70–99)
Glucose-Capillary: 342 mg/dL — ABNORMAL HIGH (ref 70–99)
Glucose-Capillary: 352 mg/dL — ABNORMAL HIGH (ref 70–99)
Glucose-Capillary: 358 mg/dL — ABNORMAL HIGH (ref 70–99)
Glucose-Capillary: 364 mg/dL — ABNORMAL HIGH (ref 70–99)
Glucose-Capillary: 386 mg/dL — ABNORMAL HIGH (ref 70–99)
Glucose-Capillary: 414 mg/dL — ABNORMAL HIGH (ref 70–99)
Glucose-Capillary: 562 mg/dL (ref 70–99)
Glucose-Capillary: >600 mg/dL (ref 70–99)
Glucose-Capillary: >600 mg/dL (ref 70–99)
Glucose-Capillary: >600 mg/dL (ref 70–99)
Glucose-Capillary: >600 mg/dL (ref 70–99)
Glucose-Capillary: >600 mg/dL (ref 70–99)

## 2010-07-11 LAB — COMPREHENSIVE METABOLIC PANEL
ALT: 21 U/L (ref 0–53)
ALT: 25 U/L (ref 0–53)
ALT: 27 U/L (ref 0–53)
ALT: 30 U/L (ref 0–53)
AST: 25 U/L (ref 0–37)
AST: 23 U/L (ref 0–37)
AST: 26 U/L (ref 0–37)
AST: 27 U/L (ref 0–37)
Albumin: 4 g/dL (ref 3.5–5.2)
Albumin: 3.1 g/dL — ABNORMAL LOW (ref 3.5–5.2)
Albumin: 3.5 g/dL (ref 3.5–5.2)
Albumin: 4.1 g/dL (ref 3.5–5.2)
Alkaline Phosphatase: 90 U/L (ref 39–117)
Alkaline Phosphatase: 101 U/L (ref 39–117)
Alkaline Phosphatase: 58 U/L (ref 39–117)
Alkaline Phosphatase: 62 U/L (ref 39–117)
BUN: 23 mg/dL (ref 6–23)
BUN: 10 mg/dL (ref 6–23)
BUN: 19 mg/dL (ref 6–23)
BUN: 9 mg/dL (ref 6–23)
CO2: 22 mEq/L (ref 19–32)
CO2: 21 mEq/L (ref 19–32)
CO2: 22 mEq/L (ref 19–32)
CO2: 27 mEq/L (ref 19–32)
Calcium: 9.4 mg/dL (ref 8.4–10.5)
Calcium: 8.5 mg/dL (ref 8.4–10.5)
Calcium: 8.6 mg/dL (ref 8.4–10.5)
Calcium: 9.6 mg/dL (ref 8.4–10.5)
Chloride: 100 mEq/L (ref 96–112)
Chloride: 100 mEq/L (ref 96–112)
Chloride: 106 mEq/L (ref 96–112)
Chloride: 85 mEq/L — ABNORMAL LOW (ref 96–112)
Creatinine, Ser: 1.13 mg/dL (ref 0.4–1.5)
Creatinine, Ser: 0.72 mg/dL (ref 0.4–1.5)
Creatinine, Ser: 0.75 mg/dL (ref 0.4–1.5)
Creatinine, Ser: 1.57 mg/dL — ABNORMAL HIGH (ref 0.4–1.5)
GFR calc Af Amer: >60 mL/min (ref >60)
GFR calc Af Amer: 58 mL/min — ABNORMAL LOW (ref >60)
GFR calc Af Amer: >60 mL/min (ref >60)
GFR calc Af Amer: >60 mL/min (ref >60)
GFR calc non Af Amer: >60 mL/min (ref >60)
GFR calc non Af Amer: 48 mL/min — ABNORMAL LOW (ref >60)
GFR calc non Af Amer: >60 mL/min (ref >60)
GFR calc non Af Amer: >60 mL/min (ref >60)
Glucose, Bld: 462 mg/dL — ABNORMAL HIGH (ref 70–99)
Glucose, Bld: 1061 mg/dL (ref 70–99)
Glucose, Bld: 206 mg/dL — ABNORMAL HIGH (ref 70–99)
Glucose, Bld: 299 mg/dL — ABNORMAL HIGH (ref 70–99)
Potassium: 4.6 mEq/L (ref 3.5–5.1)
Potassium: 3.7 mEq/L (ref 3.5–5.1)
Potassium: 3.9 mEq/L (ref 3.5–5.1)
Potassium: 5.3 mEq/L — ABNORMAL HIGH (ref 3.5–5.1)
Sodium: 133 mEq/L — ABNORMAL LOW (ref 135–145)
Sodium: 121 mEq/L — ABNORMAL LOW (ref 135–145)
Sodium: 133 mEq/L — ABNORMAL LOW (ref 135–145)
Sodium: 136 mEq/L (ref 135–145)
Total Bilirubin: 1.1 mg/dL (ref 0.3–1.2)
Total Bilirubin: 0.6 mg/dL (ref 0.3–1.2)
Total Bilirubin: 0.6 mg/dL (ref 0.3–1.2)
Total Bilirubin: 1.4 mg/dL — ABNORMAL HIGH (ref 0.3–1.2)
Total Protein: 8.8 g/dL — ABNORMAL HIGH (ref 6.0–8.3)
Total Protein: 6.5 g/dL (ref 6.0–8.3)
Total Protein: 7.2 g/dL (ref 6.0–8.3)
Total Protein: 8.6 g/dL — ABNORMAL HIGH (ref 6.0–8.3)

## 2010-07-11 LAB — URINALYSIS, ROUTINE W REFLEX MICROSCOPIC
Bilirubin Urine: NEGATIVE
Bilirubin Urine: NEGATIVE
Glucose, UA: >1000 mg/dL — AB
Glucose, UA: >1000 mg/dL — AB
Hgb urine dipstick: NEGATIVE
Hgb urine dipstick: NEGATIVE
Ketones, ur: 15 mg/dL — AB
Ketones, ur: NEGATIVE mg/dL
Leukocytes, UA: NEGATIVE
Leukocytes, UA: NEGATIVE
Nitrite: NEGATIVE
Nitrite: NEGATIVE
Protein, ur: NEGATIVE mg/dL
Protein, ur: NEGATIVE mg/dL
Specific Gravity, Urine: 1.028 (ref 1.005–1.030)
Specific Gravity, Urine: 1.03 (ref 1.005–1.030)
Urobilinogen, UA: 0.2 mg/dL (ref 0.0–1.0)
Urobilinogen, UA: 0.2 mg/dL (ref 0.0–1.0)
pH: 5 (ref 5.0–8.0)
pH: 6 (ref 5.0–8.0)

## 2010-07-11 LAB — POCT I-STAT 3, VENOUS BLOOD GAS (G3P V)
Acid-Base Excess: 1 mmol/L (ref 0.0–2.0)
Bicarbonate: 25.5 mEq/L — ABNORMAL HIGH (ref 20.0–24.0)
O2 Saturation: 52 %
TCO2: 27 mmol/L (ref 0–100)
pCO2, Ven: 39 mmHg — ABNORMAL LOW (ref 45.0–50.0)
pH, Ven: 7.423 — ABNORMAL HIGH (ref 7.250–7.300)
pO2, Ven: 27 mmHg — CL (ref 30.0–45.0)

## 2010-07-11 LAB — ETHANOL: Alcohol, Ethyl (B): <5 mg/dL (ref 0–10)

## 2010-07-11 LAB — BASIC METABOLIC PANEL
BUN: 15 mg/dL (ref 6–23)
BUN: 13 mg/dL (ref 6–23)
BUN: 15 mg/dL (ref 6–23)
BUN: 15 mg/dL (ref 6–23)
CO2: 25 mEq/L (ref 19–32)
CO2: 22 mEq/L (ref 19–32)
CO2: 24 mEq/L (ref 19–32)
CO2: 27 mEq/L (ref 19–32)
Calcium: 8.5 mg/dL (ref 8.4–10.5)
Calcium: 10 mg/dL (ref 8.4–10.5)
Calcium: 9.8 mg/dL (ref 8.4–10.5)
Calcium: 9.8 mg/dL (ref 8.4–10.5)
Chloride: 101 mEq/L (ref 96–112)
Chloride: 82 mEq/L — ABNORMAL LOW (ref 96–112)
Chloride: 92 mEq/L — ABNORMAL LOW (ref 96–112)
Chloride: 94 mEq/L — ABNORMAL LOW (ref 96–112)
Creatinine, Ser: 0.8 mg/dL (ref 0.4–1.5)
Creatinine, Ser: 0.97 mg/dL (ref 0.4–1.5)
Creatinine, Ser: 1.09 mg/dL (ref 0.4–1.5)
Creatinine, Ser: 1.3 mg/dL (ref 0.4–1.5)
GFR calc Af Amer: >60 mL/min (ref >60)
GFR calc Af Amer: >60 mL/min (ref >60)
GFR calc Af Amer: >60 mL/min (ref >60)
GFR calc Af Amer: >60 mL/min (ref >60)
GFR calc non Af Amer: >60 mL/min (ref >60)
GFR calc non Af Amer: 60 mL/min — ABNORMAL LOW (ref >60)
GFR calc non Af Amer: >60 mL/min (ref >60)
GFR calc non Af Amer: >60 mL/min (ref >60)
Glucose, Bld: 396 mg/dL — ABNORMAL HIGH (ref 70–99)
Glucose, Bld: 313 mg/dL — ABNORMAL HIGH (ref 70–99)
Glucose, Bld: 572 mg/dL (ref 70–99)
Glucose, Bld: 864 mg/dL (ref 70–99)
Potassium: 4.2 mEq/L (ref 3.5–5.1)
Potassium: 3.9 mEq/L (ref 3.5–5.1)
Potassium: 4.2 mEq/L (ref 3.5–5.1)
Potassium: 4.7 mEq/L (ref 3.5–5.1)
Sodium: 131 mEq/L — ABNORMAL LOW (ref 135–145)
Sodium: 120 mEq/L — ABNORMAL LOW (ref 135–145)
Sodium: 129 mEq/L — ABNORMAL LOW (ref 135–145)
Sodium: 130 mEq/L — ABNORMAL LOW (ref 135–145)

## 2010-07-11 LAB — TROPONIN I
Troponin I: 0.01 ng/mL (ref 0.00–0.06)
Troponin I: 0.02 ng/mL (ref 0.00–0.06)
Troponin I: 0.02 ng/mL (ref 0.00–0.06)

## 2010-07-11 LAB — DIFFERENTIAL
Basophils Absolute: 0 10*3/uL (ref 0.0–0.1)
Basophils Absolute: 0 10*3/uL (ref 0.0–0.1)
Basophils Absolute: 0 10*3/uL (ref 0.0–0.1)
Basophils Relative: 0 % (ref 0–1)
Basophils Relative: 0 % (ref 0–1)
Basophils Relative: 0 % (ref 0–1)
Eosinophils Absolute: 0 10*3/uL (ref 0.0–0.7)
Eosinophils Absolute: 0 10*3/uL (ref 0.0–0.7)
Eosinophils Absolute: 0.1 10*3/uL (ref 0.0–0.7)
Eosinophils Relative: 0 % (ref 0–5)
Eosinophils Relative: 0 % (ref 0–5)
Eosinophils Relative: 1 % (ref 0–5)
Lymphocytes Relative: 23 % (ref 12–46)
Lymphocytes Relative: 20 % (ref 12–46)
Lymphocytes Relative: 28 % (ref 12–46)
Lymphs Abs: 1.8 10*3/uL (ref 0.7–4.0)
Lymphs Abs: 1.9 10*3/uL (ref 0.7–4.0)
Lymphs Abs: 2 10*3/uL (ref 0.7–4.0)
Monocytes Absolute: 0.5 10*3/uL (ref 0.1–1.0)
Monocytes Absolute: 0.4 10*3/uL (ref 0.1–1.0)
Monocytes Absolute: 0.7 10*3/uL (ref 0.1–1.0)
Monocytes Relative: 6 % (ref 3–12)
Monocytes Relative: 6 % (ref 3–12)
Monocytes Relative: 7 % (ref 3–12)
Neutro Abs: 5.5 10*3/uL (ref 1.7–7.7)
Neutro Abs: 4.3 10*3/uL (ref 1.7–7.7)
Neutro Abs: 7.2 10*3/uL (ref 1.7–7.7)
Neutrophils Relative %: 70 % (ref 43–77)
Neutrophils Relative %: 65 % (ref 43–77)
Neutrophils Relative %: 72 % (ref 43–77)

## 2010-07-11 LAB — POCT I-STAT 3, ART BLOOD GAS (G3+)
Acid-base deficit: 3 mmol/L — ABNORMAL HIGH (ref 0.0–2.0)
Bicarbonate: 21.6 mEq/L (ref 20.0–24.0)
O2 Saturation: 95 %
Patient temperature: 37
TCO2: 23 mmol/L (ref 0–100)
pCO2 arterial: 37.4 mmHg (ref 35.0–45.0)
pH, Arterial: 7.368 (ref 7.350–7.450)
pO2, Arterial: 78 mmHg — ABNORMAL LOW (ref 80.0–100.0)

## 2010-07-11 LAB — CBC
HCT: 41.2 % (ref 39.0–52.0)
HCT: 33.6 % — ABNORMAL LOW (ref 39.0–52.0)
HCT: 39.2 % (ref 39.0–52.0)
HCT: 39.4 % (ref 39.0–52.0)
Hemoglobin: 13.9 g/dL (ref 13.0–17.0)
Hemoglobin: 11.6 g/dL — ABNORMAL LOW (ref 13.0–17.0)
Hemoglobin: 13.1 g/dL (ref 13.0–17.0)
Hemoglobin: 13.4 g/dL (ref 13.0–17.0)
MCHC: 33.8 g/dL (ref 30.0–36.0)
MCHC: 33.2 g/dL (ref 30.0–36.0)
MCHC: 34.3 g/dL (ref 30.0–36.0)
MCHC: 34.5 g/dL (ref 30.0–36.0)
MCV: 84.2 fL (ref 78.0–100.0)
MCV: 84 fL (ref 78.0–100.0)
MCV: 84.3 fL (ref 78.0–100.0)
MCV: 85.7 fL (ref 78.0–100.0)
Platelets: 205 10*3/uL (ref 150–400)
Platelets: 205 10*3/uL (ref 150–400)
Platelets: 226 10*3/uL (ref 150–400)
Platelets: 242 10*3/uL (ref 150–400)
RBC: 4.9 MIL/uL (ref 4.22–5.81)
RBC: 3.98 MIL/uL — ABNORMAL LOW (ref 4.22–5.81)
RBC: 4.6 MIL/uL (ref 4.22–5.81)
RBC: 4.66 MIL/uL (ref 4.22–5.81)
RDW: 12.2 % (ref 11.5–15.5)
RDW: 12.5 % (ref 11.5–15.5)
RDW: 12.7 % (ref 11.5–15.5)
RDW: 12.8 % (ref 11.5–15.5)
WBC: 7.8 10*3/uL (ref 4.0–10.5)
WBC: 10 10*3/uL (ref 4.0–10.5)
WBC: 6.6 10*3/uL (ref 4.0–10.5)
WBC: 7.4 10*3/uL (ref 4.0–10.5)

## 2010-07-11 LAB — POCT I-STAT, CHEM 8
BUN: 21 mg/dL (ref 6–23)
Calcium, Ion: 1.18 mmol/L (ref 1.12–1.32)
Chloride: 91 mEq/L — ABNORMAL LOW (ref 96–112)
Creatinine, Ser: 1.2 mg/dL (ref 0.4–1.5)
Glucose, Bld: >700 mg/dL (ref 70–99)
HCT: 43 % (ref 39.0–52.0)
Hemoglobin: 14.6 g/dL (ref 13.0–17.0)
Potassium: 5.4 mEq/L — ABNORMAL HIGH (ref 3.5–5.1)
Sodium: 122 mEq/L — ABNORMAL LOW (ref 135–145)
TCO2: 22 mmol/L (ref 0–100)

## 2010-07-11 LAB — MAGNESIUM
Magnesium: 1.7 mg/dL (ref 1.5–2.5)
Magnesium: 1.9 mg/dL (ref 1.5–2.5)
Magnesium: 2.5 mg/dL (ref 1.5–2.5)

## 2010-07-11 LAB — POCT CARDIAC MARKERS
CKMB, poc: 2.5 ng/mL (ref 1.0–8.0)
Myoglobin, poc: 128 ng/mL (ref 12–200)
Troponin i, poc: <0.05 ng/mL (ref 0.00–0.09)

## 2010-07-11 LAB — KETONES, QUALITATIVE
Acetone, Bld: NEGATIVE
Acetone, Bld: NEGATIVE

## 2010-07-11 LAB — PHOSPHORUS
Phosphorus: 2 mg/dL — ABNORMAL LOW (ref 2.3–4.6)
Phosphorus: 2.4 mg/dL (ref 2.3–4.6)
Phosphorus: 3.8 mg/dL (ref 2.3–4.6)

## 2010-07-11 LAB — URINE MICROSCOPIC-ADD ON

## 2010-07-11 LAB — HEMOGLOBIN A1C
Hgb A1c MFr Bld: 15.5 % — ABNORMAL HIGH (ref 4.6–6.1)
Mean Plasma Glucose: 398 mg/dL

## 2010-07-11 LAB — URINE CULTURE
Colony Count: NO GROWTH
Culture: NO GROWTH

## 2010-07-11 LAB — PROTIME-INR
INR: 1.04 (ref 0.00–1.49)
Prothrombin Time: 13.5 seconds (ref 11.6–15.2)

## 2010-07-11 LAB — CALCIUM: Calcium: 8.9 mg/dL (ref 8.4–10.5)

## 2010-07-11 LAB — OSMOLALITY: Osmolality: 300 mOsm/kg (ref 275–300)

## 2010-07-12 LAB — GLUCOSE, CAPILLARY
Glucose-Capillary: 206 mg/dL — ABNORMAL HIGH (ref 70–99)
Glucose-Capillary: 217 mg/dL — ABNORMAL HIGH (ref 70–99)
Glucose-Capillary: 259 mg/dL — ABNORMAL HIGH (ref 70–99)
Glucose-Capillary: 277 mg/dL — ABNORMAL HIGH (ref 70–99)
Glucose-Capillary: 280 mg/dL — ABNORMAL HIGH (ref 70–99)
Glucose-Capillary: 286 mg/dL — ABNORMAL HIGH (ref 70–99)
Glucose-Capillary: 296 mg/dL — ABNORMAL HIGH (ref 70–99)
Glucose-Capillary: 298 mg/dL — ABNORMAL HIGH (ref 70–99)
Glucose-Capillary: 298 mg/dL — ABNORMAL HIGH (ref 70–99)
Glucose-Capillary: 318 mg/dL — ABNORMAL HIGH (ref 70–99)
Glucose-Capillary: 321 mg/dL — ABNORMAL HIGH (ref 70–99)
Glucose-Capillary: 327 mg/dL — ABNORMAL HIGH (ref 70–99)
Glucose-Capillary: 328 mg/dL — ABNORMAL HIGH (ref 70–99)
Glucose-Capillary: 333 mg/dL — ABNORMAL HIGH (ref 70–99)
Glucose-Capillary: 342 mg/dL — ABNORMAL HIGH (ref 70–99)
Glucose-Capillary: 342 mg/dL — ABNORMAL HIGH (ref 70–99)
Glucose-Capillary: 354 mg/dL — ABNORMAL HIGH (ref 70–99)
Glucose-Capillary: 355 mg/dL — ABNORMAL HIGH (ref 70–99)
Glucose-Capillary: 357 mg/dL — ABNORMAL HIGH (ref 70–99)
Glucose-Capillary: 360 mg/dL — ABNORMAL HIGH (ref 70–99)
Glucose-Capillary: 392 mg/dL — ABNORMAL HIGH (ref 70–99)
Glucose-Capillary: 423 mg/dL — ABNORMAL HIGH (ref 70–99)
Glucose-Capillary: 472 mg/dL — ABNORMAL HIGH (ref 70–99)
Glucose-Capillary: 581 mg/dL (ref 70–99)
Glucose-Capillary: >600 mg/dL (ref 70–99)
Glucose-Capillary: >600 mg/dL (ref 70–99)
Glucose-Capillary: >600 mg/dL (ref 70–99)

## 2010-07-12 LAB — CBC
HCT: 33.7 % — ABNORMAL LOW (ref 39.0–52.0)
HCT: 36.7 % — ABNORMAL LOW (ref 39.0–52.0)
HCT: 40.3 % (ref 39.0–52.0)
Hemoglobin: 11.5 g/dL — ABNORMAL LOW (ref 13.0–17.0)
Hemoglobin: 12.4 g/dL — ABNORMAL LOW (ref 13.0–17.0)
Hemoglobin: 13.8 g/dL (ref 13.0–17.0)
MCHC: 34 g/dL (ref 30.0–36.0)
MCHC: 34 g/dL (ref 30.0–36.0)
MCHC: 34.2 g/dL (ref 30.0–36.0)
MCV: 84.6 fL (ref 78.0–100.0)
MCV: 84.8 fL (ref 78.0–100.0)
MCV: 84.9 fL (ref 78.0–100.0)
Platelets: 158 10*3/uL (ref 150–400)
Platelets: 167 10*3/uL (ref 150–400)
Platelets: 183 10*3/uL (ref 150–400)
RBC: 3.99 MIL/uL — ABNORMAL LOW (ref 4.22–5.81)
RBC: 4.33 MIL/uL (ref 4.22–5.81)
RBC: 4.75 MIL/uL (ref 4.22–5.81)
RDW: 12.3 % (ref 11.5–15.5)
RDW: 12.3 % (ref 11.5–15.5)
RDW: 12.6 % (ref 11.5–15.5)
WBC: 6.4 10*3/uL (ref 4.0–10.5)
WBC: 7.2 10*3/uL (ref 4.0–10.5)
WBC: 7.4 10*3/uL (ref 4.0–10.5)

## 2010-07-12 LAB — COMPREHENSIVE METABOLIC PANEL
ALT: 23 U/L (ref 0–53)
ALT: 28 U/L (ref 0–53)
ALT: 38 U/L (ref 0–53)
AST: 24 U/L (ref 0–37)
AST: 31 U/L (ref 0–37)
AST: 32 U/L (ref 0–37)
Albumin: 3.1 g/dL — ABNORMAL LOW (ref 3.5–5.2)
Albumin: 3.3 g/dL — ABNORMAL LOW (ref 3.5–5.2)
Albumin: 4.1 g/dL (ref 3.5–5.2)
Alkaline Phosphatase: 57 U/L (ref 39–117)
Alkaline Phosphatase: 58 U/L (ref 39–117)
Alkaline Phosphatase: 84 U/L (ref 39–117)
BUN: 10 mg/dL (ref 6–23)
BUN: 10 mg/dL (ref 6–23)
BUN: 15 mg/dL (ref 6–23)
CO2: 22 mEq/L (ref 19–32)
CO2: 23 mEq/L (ref 19–32)
CO2: 24 mEq/L (ref 19–32)
Calcium: 8.7 mg/dL (ref 8.4–10.5)
Calcium: 8.7 mg/dL (ref 8.4–10.5)
Calcium: 9.6 mg/dL (ref 8.4–10.5)
Chloride: 100 mEq/L (ref 96–112)
Chloride: 93 mEq/L — ABNORMAL LOW (ref 96–112)
Chloride: 98 mEq/L (ref 96–112)
Creatinine, Ser: 0.97 mg/dL (ref 0.4–1.5)
Creatinine, Ser: 0.99 mg/dL (ref 0.4–1.5)
Creatinine, Ser: 1.08 mg/dL (ref 0.4–1.5)
GFR calc Af Amer: >60 mL/min (ref >60)
GFR calc Af Amer: >60 mL/min (ref >60)
GFR calc Af Amer: >60 mL/min (ref >60)
GFR calc non Af Amer: >60 mL/min (ref >60)
GFR calc non Af Amer: >60 mL/min (ref >60)
GFR calc non Af Amer: >60 mL/min (ref >60)
Glucose, Bld: 331 mg/dL — ABNORMAL HIGH (ref 70–99)
Glucose, Bld: 364 mg/dL — ABNORMAL HIGH (ref 70–99)
Glucose, Bld: 624 mg/dL (ref 70–99)
Potassium: 3.7 mEq/L (ref 3.5–5.1)
Potassium: 4.3 mEq/L (ref 3.5–5.1)
Potassium: 4.7 mEq/L (ref 3.5–5.1)
Sodium: 126 mEq/L — ABNORMAL LOW (ref 135–145)
Sodium: 128 mEq/L — ABNORMAL LOW (ref 135–145)
Sodium: 132 mEq/L — ABNORMAL LOW (ref 135–145)
Total Bilirubin: 2 mg/dL — ABNORMAL HIGH (ref 0.3–1.2)
Total Bilirubin: 2.2 mg/dL — ABNORMAL HIGH (ref 0.3–1.2)
Total Bilirubin: 2.4 mg/dL — ABNORMAL HIGH (ref 0.3–1.2)
Total Protein: 7.2 g/dL (ref 6.0–8.3)
Total Protein: 7.5 g/dL (ref 6.0–8.3)
Total Protein: 8.4 g/dL — ABNORMAL HIGH (ref 6.0–8.3)

## 2010-07-12 LAB — CARDIAC PANEL(CRET KIN+CKTOT+MB+TROPI)
CK, MB: 1.5 ng/mL (ref 0.3–4.0)
CK, MB: 1.6 ng/mL (ref 0.3–4.0)
Relative Index: 0.6 (ref 0.0–2.5)
Relative Index: 0.6 (ref 0.0–2.5)
Total CK: 233 U/L — ABNORMAL HIGH (ref 7–232)
Total CK: 268 U/L — ABNORMAL HIGH (ref 7–232)
Troponin I: 0.01 ng/mL (ref 0.00–0.06)
Troponin I: <0.01 ng/mL (ref 0.00–0.06)

## 2010-07-12 LAB — PHOSPHORUS
Phosphorus: 2.8 mg/dL (ref 2.3–4.6)
Phosphorus: 3.4 mg/dL (ref 2.3–4.6)

## 2010-07-12 LAB — URINE MICROSCOPIC-ADD ON

## 2010-07-12 LAB — URINALYSIS, ROUTINE W REFLEX MICROSCOPIC
Bilirubin Urine: NEGATIVE
Glucose, UA: >1000 mg/dL — AB
Hgb urine dipstick: NEGATIVE
Ketones, ur: NEGATIVE mg/dL
Leukocytes, UA: NEGATIVE
Nitrite: NEGATIVE
Protein, ur: NEGATIVE mg/dL
Specific Gravity, Urine: 1.031 — ABNORMAL HIGH (ref 1.005–1.030)
Urobilinogen, UA: 0.2 mg/dL (ref 0.0–1.0)
pH: 5 (ref 5.0–8.0)

## 2010-07-12 LAB — CULTURE, ROUTINE-ABSCESS

## 2010-07-12 LAB — TROPONIN I: Troponin I: 0.04 ng/mL (ref 0.00–0.06)

## 2010-07-12 LAB — LIPID PANEL
Cholesterol: 160 mg/dL (ref 0–200)
Cholesterol: 204 mg/dL — ABNORMAL HIGH (ref 0–200)
HDL: 24 mg/dL — ABNORMAL LOW (ref >39)
HDL: 30 mg/dL — ABNORMAL LOW (ref >39)
LDL Cholesterol: 77 mg/dL (ref 0–99)
LDL Cholesterol: UNDETERMINED mg/dL (ref 0–99)
Total CHOL/HDL Ratio: 6.7 RATIO
Total CHOL/HDL Ratio: 6.8 RATIO
Triglycerides: 297 mg/dL — ABNORMAL HIGH (ref <150)
Triglycerides: 947 mg/dL — ABNORMAL HIGH (ref <150)
VLDL: 59 mg/dL — ABNORMAL HIGH (ref 0–40)
VLDL: UNDETERMINED mg/dL (ref 0–40)

## 2010-07-12 LAB — PROTIME-INR
INR: 0.97 (ref 0.00–1.49)
INR: 1.02 (ref 0.00–1.49)
Prothrombin Time: 12.8 seconds (ref 11.6–15.2)
Prothrombin Time: 13.3 seconds (ref 11.6–15.2)

## 2010-07-12 LAB — DIFFERENTIAL
Basophils Absolute: 0 10*3/uL (ref 0.0–0.1)
Basophils Relative: 0 % (ref 0–1)
Eosinophils Absolute: 0.1 10*3/uL (ref 0.0–0.7)
Eosinophils Relative: 1 % (ref 0–5)
Lymphocytes Relative: 35 % (ref 12–46)
Lymphs Abs: 2.5 10*3/uL (ref 0.7–4.0)
Monocytes Absolute: 0.6 10*3/uL (ref 0.1–1.0)
Monocytes Relative: 8 % (ref 3–12)
Neutro Abs: 4 10*3/uL (ref 1.7–7.7)
Neutrophils Relative %: 55 % (ref 43–77)

## 2010-07-12 LAB — CK TOTAL AND CKMB (NOT AT ARMC)
CK, MB: 1.7 ng/mL (ref 0.3–4.0)
Relative Index: 0.6 (ref 0.0–2.5)
Total CK: 301 U/L — ABNORMAL HIGH (ref 7–232)

## 2010-07-12 LAB — ETHANOL: Alcohol, Ethyl (B): <5 mg/dL (ref 0–10)

## 2010-07-12 LAB — BASIC METABOLIC PANEL
BUN: 17 mg/dL (ref 6–23)
CO2: 24 mEq/L (ref 19–32)
Calcium: 9.8 mg/dL (ref 8.4–10.5)
Chloride: 89 mEq/L — ABNORMAL LOW (ref 96–112)
Creatinine, Ser: 1.27 mg/dL (ref 0.4–1.5)
GFR calc Af Amer: >60 mL/min (ref >60)
GFR calc non Af Amer: >60 mL/min (ref >60)
Glucose, Bld: 952 mg/dL (ref 70–99)
Potassium: 4.6 mEq/L (ref 3.5–5.1)
Sodium: 123 mEq/L — ABNORMAL LOW (ref 135–145)

## 2010-07-12 LAB — HEMOGLOBIN A1C
Hgb A1c MFr Bld: <4 % — ABNORMAL LOW (ref 4.6–6.1)
Mean Plasma Glucose: UNDETERMINED mg/dL

## 2010-07-12 LAB — ANAEROBIC CULTURE

## 2010-07-12 LAB — KETONES, QUALITATIVE: Acetone, Bld: NEGATIVE

## 2010-07-12 LAB — GRAM STAIN

## 2010-07-12 LAB — MRSA PCR SCREENING: MRSA by PCR: NEGATIVE

## 2010-07-12 LAB — LIPASE, BLOOD: Lipase: 23 U/L (ref 11–59)

## 2010-07-12 LAB — MAGNESIUM
Magnesium: 1.7 mg/dL (ref 1.5–2.5)
Magnesium: 1.9 mg/dL (ref 1.5–2.5)
Magnesium: 2.5 mg/dL (ref 1.5–2.5)

## 2010-07-12 LAB — HIV 1/2 CONFIRMATION
HIV-1 antibody: POSITIVE
HIV-2 Ab: UNDETERMINED

## 2010-07-12 LAB — APTT: aPTT: 27 seconds (ref 24–37)

## 2010-07-12 LAB — HIV ANTIBODY (ROUTINE TESTING W REFLEX): HIV: REACTIVE — AB

## 2010-07-12 LAB — OSMOLALITY: Osmolality: 291 mOsm/kg (ref 275–300)

## 2010-07-12 LAB — T-HELPER CELLS (CD4) COUNT (NOT AT ARMC)
CD4 % Helper T Cell: 11 % — ABNORMAL LOW (ref 33–55)
CD4 T Cell Abs: 260 uL — ABNORMAL LOW (ref 400–2700)

## 2010-07-16 ENCOUNTER — Other Ambulatory Visit: Payer: Self-pay | Admitting: *Deleted

## 2010-07-16 MED ORDER — INSULIN GLARGINE 100 UNIT/ML ~~LOC~~ SOLN: 90.0000 [IU] | Freq: Every day | SUBCUTANEOUS | Status: DC

## 2010-07-16 MED ORDER — PEN NEEDLES 31G X 6 MM MISC: 1.0000 | Freq: Every day | Status: DC

## 2010-07-17 ENCOUNTER — Ambulatory Visit: Payer: Medicaid Other | Admitting: Internal Medicine

## 2010-07-18 ENCOUNTER — Ambulatory Visit: Payer: Medicaid Other | Admitting: Dietician

## 2010-07-18 ENCOUNTER — Ambulatory Visit (INDEPENDENT_AMBULATORY_CARE_PROVIDER_SITE_OTHER): Payer: Medicaid Other | Admitting: Internal Medicine

## 2010-07-18 ENCOUNTER — Emergency Department (HOSPITAL_COMMUNITY): Payer: Medicaid Other

## 2010-07-18 ENCOUNTER — Observation Stay (HOSPITAL_COMMUNITY)
Admission: EM | Admit: 2010-07-18 | Discharge: 2010-07-19 | Disposition: A | Discharge status: Home / Self Care | Payer: Medicaid Other | Attending: Emergency Medicine | Admitting: Emergency Medicine

## 2010-07-18 ENCOUNTER — Encounter: Payer: Self-pay | Admitting: Internal Medicine

## 2010-07-18 VITALS — BP 133/98 | HR 83 | Temp 98.6°F | Wt 271.3 lb

## 2010-07-18 DIAGNOSIS — IMO0001 Reserved for inherently not codable concepts without codable children: Principal | ICD-10-CM | POA: Insufficient documentation

## 2010-07-18 DIAGNOSIS — E1165 Type 2 diabetes mellitus with hyperglycemia: Secondary | ICD-10-CM

## 2010-07-18 DIAGNOSIS — Z9119 Patient's noncompliance with other medical treatment and regimen: Secondary | ICD-10-CM | POA: Insufficient documentation

## 2010-07-18 DIAGNOSIS — Z91199 Patient's noncompliance with other medical treatment and regimen due to unspecified reason: Secondary | ICD-10-CM | POA: Insufficient documentation

## 2010-07-18 DIAGNOSIS — IMO0002 Reserved for concepts with insufficient information to code with codable children: Secondary | ICD-10-CM

## 2010-07-18 DIAGNOSIS — E669 Obesity, unspecified: Secondary | ICD-10-CM | POA: Insufficient documentation

## 2010-07-18 DIAGNOSIS — M25569 Pain in unspecified knee: Secondary | ICD-10-CM | POA: Insufficient documentation

## 2010-07-18 DIAGNOSIS — I10 Essential (primary) hypertension: Secondary | ICD-10-CM | POA: Insufficient documentation

## 2010-07-18 DIAGNOSIS — R7309 Other abnormal glucose: Secondary | ICD-10-CM

## 2010-07-18 DIAGNOSIS — R739 Hyperglycemia, unspecified: Secondary | ICD-10-CM

## 2010-07-18 DIAGNOSIS — Z21 Asymptomatic human immunodeficiency virus [HIV] infection status: Secondary | ICD-10-CM | POA: Insufficient documentation

## 2010-07-18 LAB — COMPREHENSIVE METABOLIC PANEL
ALT: 21 U/L (ref 0–53)
AST: 17 U/L (ref 0–37)
Albumin: 3.8 g/dL (ref 3.5–5.2)
Alkaline Phosphatase: 78 U/L (ref 39–117)
BUN: 15 mg/dL (ref 6–23)
CO2: 22 mEq/L (ref 19–32)
Calcium: 9.4 mg/dL (ref 8.4–10.5)
Chloride: 93 mEq/L — ABNORMAL LOW (ref 96–112)
Creatinine, Ser: 0.88 mg/dL (ref 0.4–1.5)
GFR calc Af Amer: >60 mL/min (ref >60)
GFR calc non Af Amer: >60 mL/min (ref >60)
Glucose, Bld: 507 mg/dL — ABNORMAL HIGH (ref 70–99)
Potassium: 3.9 mEq/L (ref 3.5–5.1)
Sodium: 126 mEq/L — ABNORMAL LOW (ref 135–145)
Total Bilirubin: 0.7 mg/dL (ref 0.3–1.2)
Total Protein: 8.4 g/dL — ABNORMAL HIGH (ref 6.0–8.3)

## 2010-07-18 LAB — GLUCOSE, CAPILLARY
Glucose-Capillary: >600 mg/dL (ref 70–99)
Glucose-Capillary: >600 mg/dL (ref 70–99)
Glucose-Capillary: >600 mg/dL (ref 70–99)
Glucose-Capillary: 553 mg/dL (ref 70–99)
Glucose-Capillary: 400 mg/dL — ABNORMAL HIGH (ref 70–99)
Glucose-Capillary: 321 mg/dL — ABNORMAL HIGH (ref 70–99)

## 2010-07-18 LAB — CBC
HCT: 39 % (ref 39.0–52.0)
HCT: 40.2 % (ref 39.0–52.0)
Hemoglobin: 12.8 g/dL — ABNORMAL LOW (ref 13.0–17.0)
Hemoglobin: 14.2 g/dL (ref 13.0–17.0)
MCH: 26.3 pg (ref 26.0–34.0)
MCH: 27 pg (ref 26.0–34.0)
MCHC: 32.8 g/dL (ref 30.0–36.0)
MCHC: 35.3 g/dL (ref 30.0–36.0)
MCV: 80.2 fL (ref 78.0–100.0)
MCV: 76.6 fL — ABNORMAL LOW (ref 78.0–100.0)
Platelets: 234 10*3/uL (ref 150–400)
Platelets: 188 10*3/uL (ref 150–400)
RBC: 4.86 MIL/uL (ref 4.22–5.81)
RBC: 5.25 MIL/uL (ref 4.22–5.81)
RDW: 13.4 % (ref 11.5–15.5)
RDW: 13 % (ref 11.5–15.5)
WBC: 5.6 10*3/uL (ref 4.0–10.5)
WBC: 5.8 10*3/uL (ref 4.0–10.5)

## 2010-07-18 LAB — BASIC METABOLIC PANEL
BUN: 13 mg/dL (ref 6–23)
CO2: 26 mEq/L (ref 19–32)
Calcium: 9.7 mg/dL (ref 8.4–10.5)
Chloride: 90 mEq/L — ABNORMAL LOW (ref 96–112)
Creat: 1.05 mg/dL (ref 0.40–1.50)
Glucose, Bld: 671 mg/dL (ref 70–99)
Potassium: 4.5 mEq/L (ref 3.5–5.3)
Sodium: 125 mEq/L — ABNORMAL LOW (ref 135–145)

## 2010-07-18 LAB — URINALYSIS, ROUTINE W REFLEX MICROSCOPIC
Bilirubin Urine: NEGATIVE
Glucose, UA: >1000 mg/dL — AB
Hgb urine dipstick: NEGATIVE
Ketones, ur: NEGATIVE mg/dL
Leukocytes, UA: NEGATIVE
Nitrite: NEGATIVE
Protein, ur: NEGATIVE mg/dL
Specific Gravity, Urine: 1.036 — ABNORMAL HIGH (ref 1.005–1.030)
Urobilinogen, UA: 0.2 mg/dL (ref 0.0–1.0)
pH: 5.5 (ref 5.0–8.0)

## 2010-07-18 LAB — DIFFERENTIAL
Basophils Absolute: 0 10*3/uL (ref 0.0–0.1)
Basophils Absolute: 0 10*3/uL (ref 0.0–0.1)
Basophils Relative: 0 % (ref 0–1)
Basophils Relative: 0 % (ref 0–1)
Eosinophils Absolute: 0 10*3/uL (ref 0.0–0.7)
Eosinophils Absolute: 0.1 10*3/uL (ref 0.0–0.7)
Eosinophils Relative: 1 % (ref 0–5)
Eosinophils Relative: 1 % (ref 0–5)
Lymphocytes Relative: 40 % (ref 12–46)
Lymphocytes Relative: 40 % (ref 12–46)
Lymphs Abs: 2.3 10*3/uL (ref 0.7–4.0)
Lymphs Abs: 2.3 10*3/uL (ref 0.7–4.0)
Monocytes Absolute: 0.3 10*3/uL (ref 0.1–1.0)
Monocytes Absolute: 0.5 10*3/uL (ref 0.1–1.0)
Monocytes Relative: 6 % (ref 3–12)
Monocytes Relative: 8 % (ref 3–12)
Neutro Abs: 3 10*3/uL (ref 1.7–7.7)
Neutro Abs: 3 10*3/uL (ref 1.7–7.7)
Neutrophils Relative %: 53 % (ref 43–77)
Neutrophils Relative %: 51 % (ref 43–77)

## 2010-07-18 LAB — URINE MICROSCOPIC-ADD ON

## 2010-07-18 MED ORDER — INSULIN REGULAR HUMAN 100 UNIT/ML IJ SOLN
20.0000 [IU] | Freq: Once | INTRAMUSCULAR | Status: AC
  Administered 2010-07-18: 20 [IU] via SUBCUTANEOUS

## 2010-07-18 MED ORDER — INSULIN REGULAR HUMAN 100 UNIT/ML IJ SOLN: 20.0000 [IU] | Freq: Once | INTRAMUSCULAR | Status: DC

## 2010-07-18 MED ORDER — INSULIN NPH ISOPHANE & REGULAR (70-30) 100 UNIT/ML ~~LOC~~ SUSP: SUBCUTANEOUS | Status: DC

## 2010-07-18 NOTE — Progress Notes (Signed)
IV started with # 22 gauge angiocath in rt hand. Bolus of 500cc NS started on pump per order Dr Cathren Laine.

## 2010-07-18 NOTE — Patient Instructions (Signed)
Hyperglycemia (High Blood Glucose) Hyperglycemia occurs when the glucose (sugar) in your blood is too high. Hyperglycemia can happen for many reasons, but it most often happens to people who do not know they have diabetes or are not managing their diabetes properly.  CAUSES Whether you have diabetes or not, there are other causes of hyperglycemia. Hyperglycemia can occur when you have diabetes, but it can also occur in other situations that you might not be as aware of, such as: Diabetes  If you have diabetes and are having problems controlling your blood glucose, hyperglycemia could occur because of some of the following reasons:   Not following your meal plan.   Not taking your diabetes medications or not taking it properly.   Exercising less or doing less activity than you normally do.   Being sick.  Pre-diabetes  This cannot be ignored. Before people develop Type 2 diabetes, they almost always have "pre-diabetes." This is when your blood glucose levels are higher than normal, but not yet high enough to be diagnosed as diabetes. Research has shown that some long-term damage to the body, especially the heart and circulatory system, may already be occurring during pre-diabetes. If you take action to manage your blood glucose when you have pre-diabetes, you may delay or prevent Type 2 diabetes from developing.  Stress  If you have diabetes, you may be "diet" controlled or on oral medications or insulin to control your diabetes. However, you may find that your blood glucose is higher than usual in the hospital whether you have diabetes or not. This is often referred to as "stress hyperglycemia." Stress can elevate your blood glucose. This happens because of hormones put out by the body during times of stress. If stress has been the cause of your high blood glucose, it can be followed regularly by your caregiver. That way he/she can make sure your hyperglycemia does not continue to get worse or  progress to diabetes.  Steroids  Steroids are medications that act on the infection fighting system (immune system) to block inflammation or infection. One side effect can be a rise in blood glucose. Most people can produce enough extra insulin to allow for this rise, but for those who cannot, steroids make blood glucose levels go even higher. It is not unusual for steroid treatments to "uncover" diabetes that is developing. It is not always possible to determine if the hyperglycemia will go away after the steroids are stopped. A special blood test called an A1c is sometimes done to determine if your blood glucose was elevated before the steroids were started.  SYMPTOMS OF HYPERGLYCEMIA  Thirsty.   Frequent urination.   Dry mouth.   Blurred vision.   Tired or fatigue.   Weakness.   Sleepy.   Tingling in feet or leg.  DIAGNOSIS Diagnosis is made by monitoring blood glucose in one or all of the following ways:  A1c test. This is a chemical found in your blood.   Fingerstick blood glucose monitoring.   Laboratory results.  TREATMENT First, knowing the cause of the hyperglycemia is important before the hyperglycemia can be treated. Treatment may include, but is not be limited to:  Education.   Change or adjustment in medications.   Change or adjustment in meal plan.   Treatment for an illness, infection, etc.   More frequent blood glucose monitoring.   Change in exercise plan.   Decreasing or stopping steroids.   Lifestyle changes.  HOME CARE INSTRUCTIONS  Test your blood glucose  as directed.   Exercise regularly. Your caregiver will give you instructions about exercise. Pre-diabetes or diabetes which comes on with stress is helped by exercising.   Eat wholesome, balanced meals. Eat often and at regular, fixed times. Your caregiver or nutritionist will give you a meal plan to guide your sugar intake.   Being at an ideal weight is important. If needed, losing as  little as 10 to 15 pounds may help improve blood glucose levels.  SEEK MEDICAL CARE IF:  You have questions about medicine, activity, or diet.   You continue to have symptoms (problems such as increased thirst, urination, or weight gain).  SEEK IMMEDIATE MEDICAL CARE IF:  You are vomiting or have diarrhea.   Your breath smells fruity.   You are breathing faster or slower.   You are very sleepy or incoherent.   You have numbness, tingling, or pain in your feet or hands.   You have chest pain.   Your symptoms get worse even though you have been following your caregiver's orders.   If you have any other questions or concerns.  Document Released: 09/18/2000 Document Re-Released: 01/20/2009 [facility] Patient Information 2011 [place].  Come back tomorrow morning for repeat blood glucose check up.

## 2010-07-19 ENCOUNTER — Encounter: Payer: Medicaid Other | Admitting: Internal Medicine

## 2010-07-19 DIAGNOSIS — R739 Hyperglycemia, unspecified: Secondary | ICD-10-CM | POA: Insufficient documentation

## 2010-07-19 LAB — GLUCOSE, CAPILLARY
Glucose-Capillary: 238 mg/dL — ABNORMAL HIGH (ref 70–99)
Glucose-Capillary: 322 mg/dL — ABNORMAL HIGH (ref 70–99)

## 2010-07-19 IMAGING — CR DG KNEE COMPLETE 4+V*L*
4 series · 4 of 4 positions shown · non-contrast
Comparison: MRI of the left knee dated 08/07/2009

CLINICAL DATA: The left anterior knee pain and swelling.

LEFT KNEE - COMPLETE 4+ VIEW

[t knee oblique left (1 of 2)]
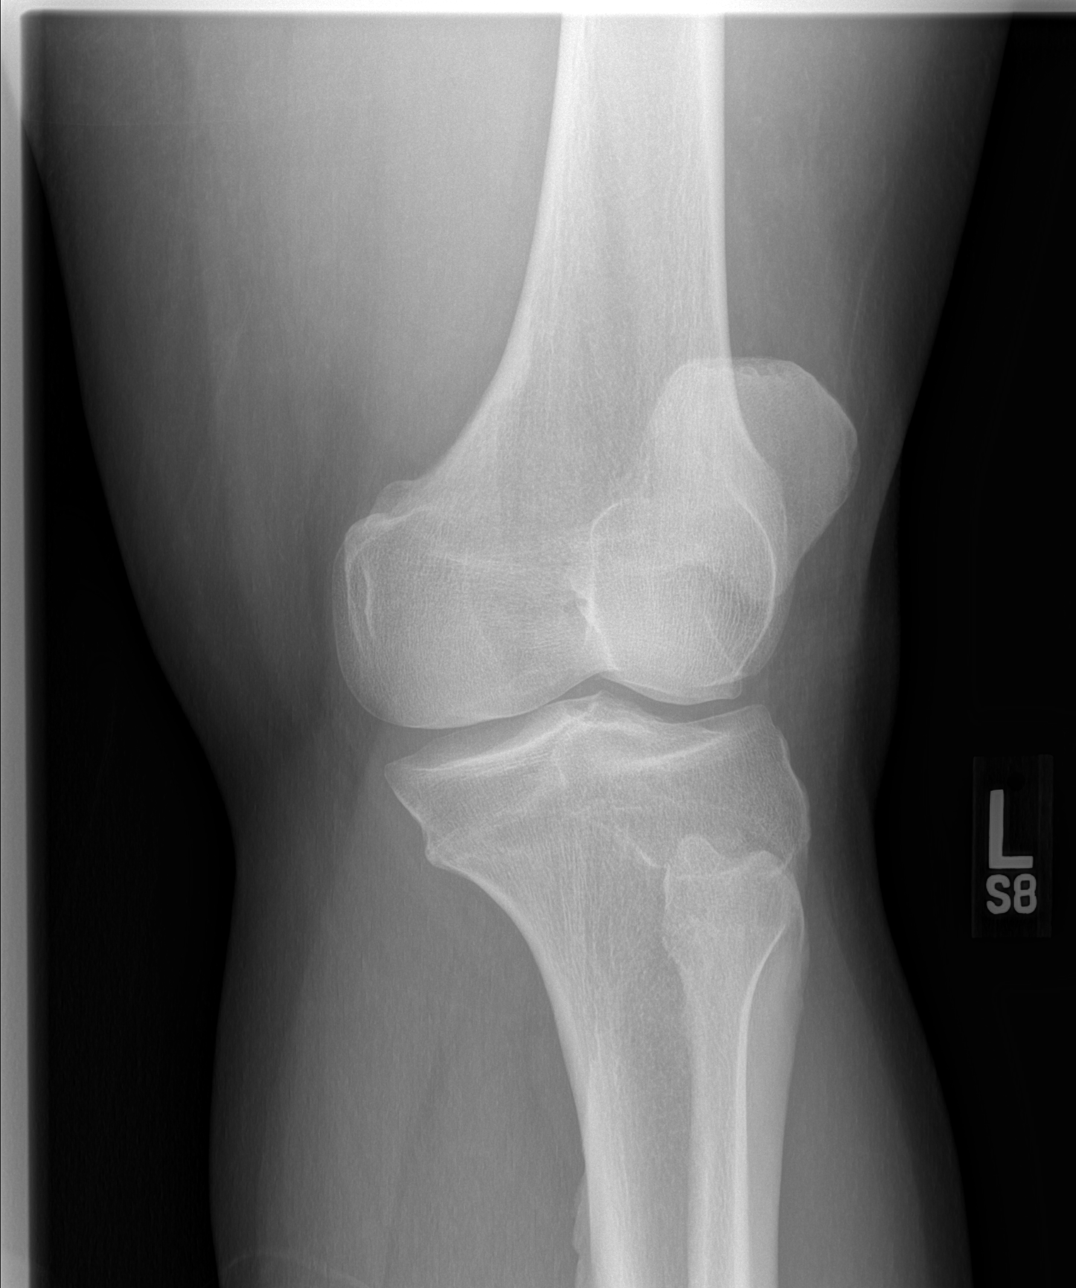

[t knee oblique left (2 of 2)]
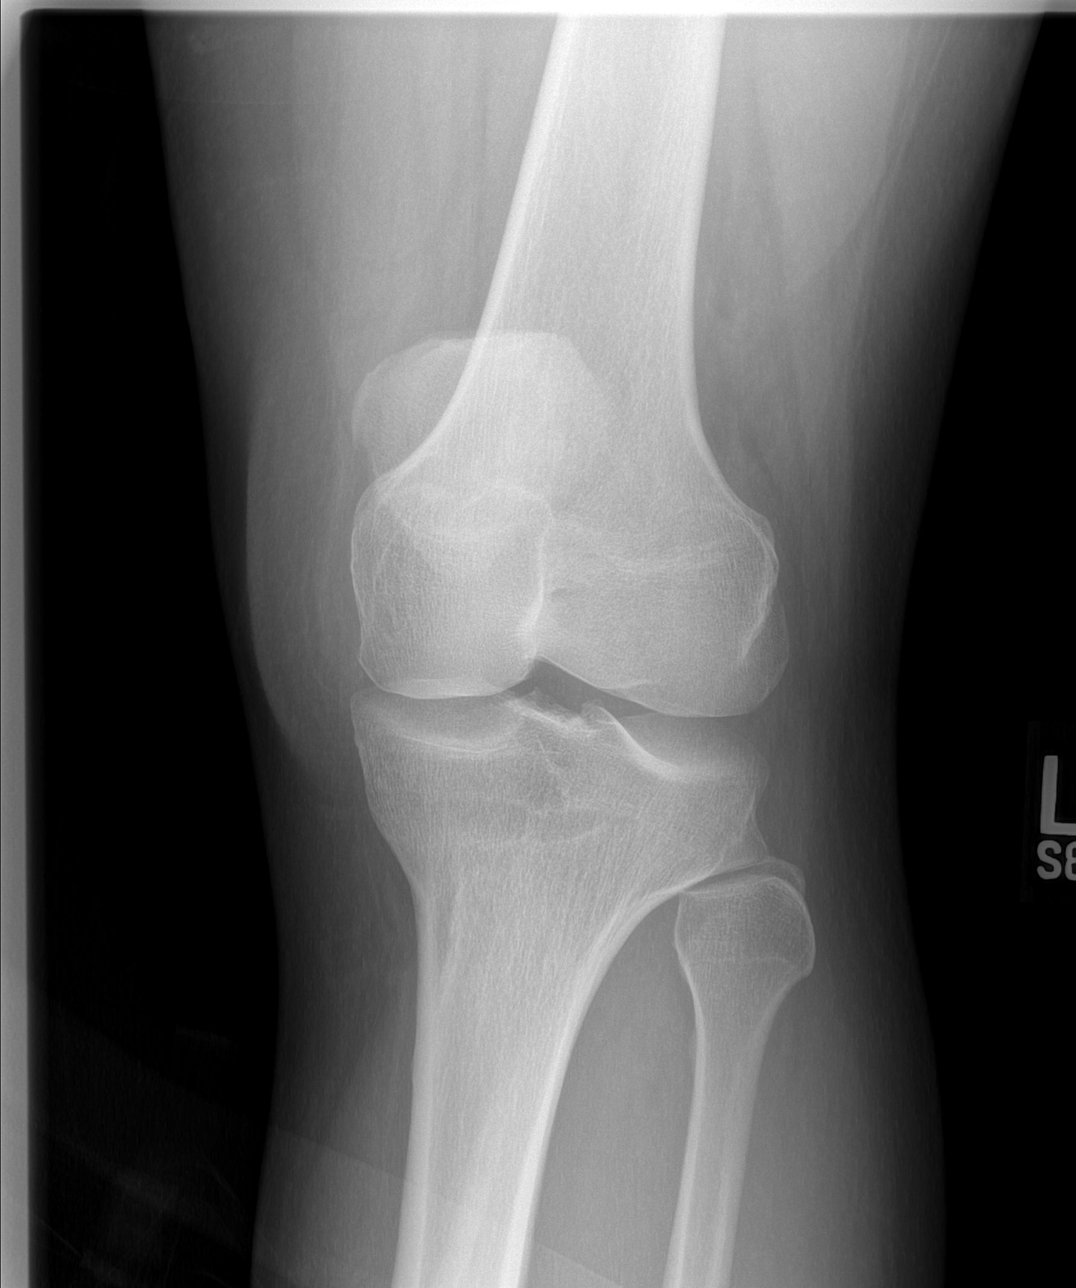

[t knee ap left]
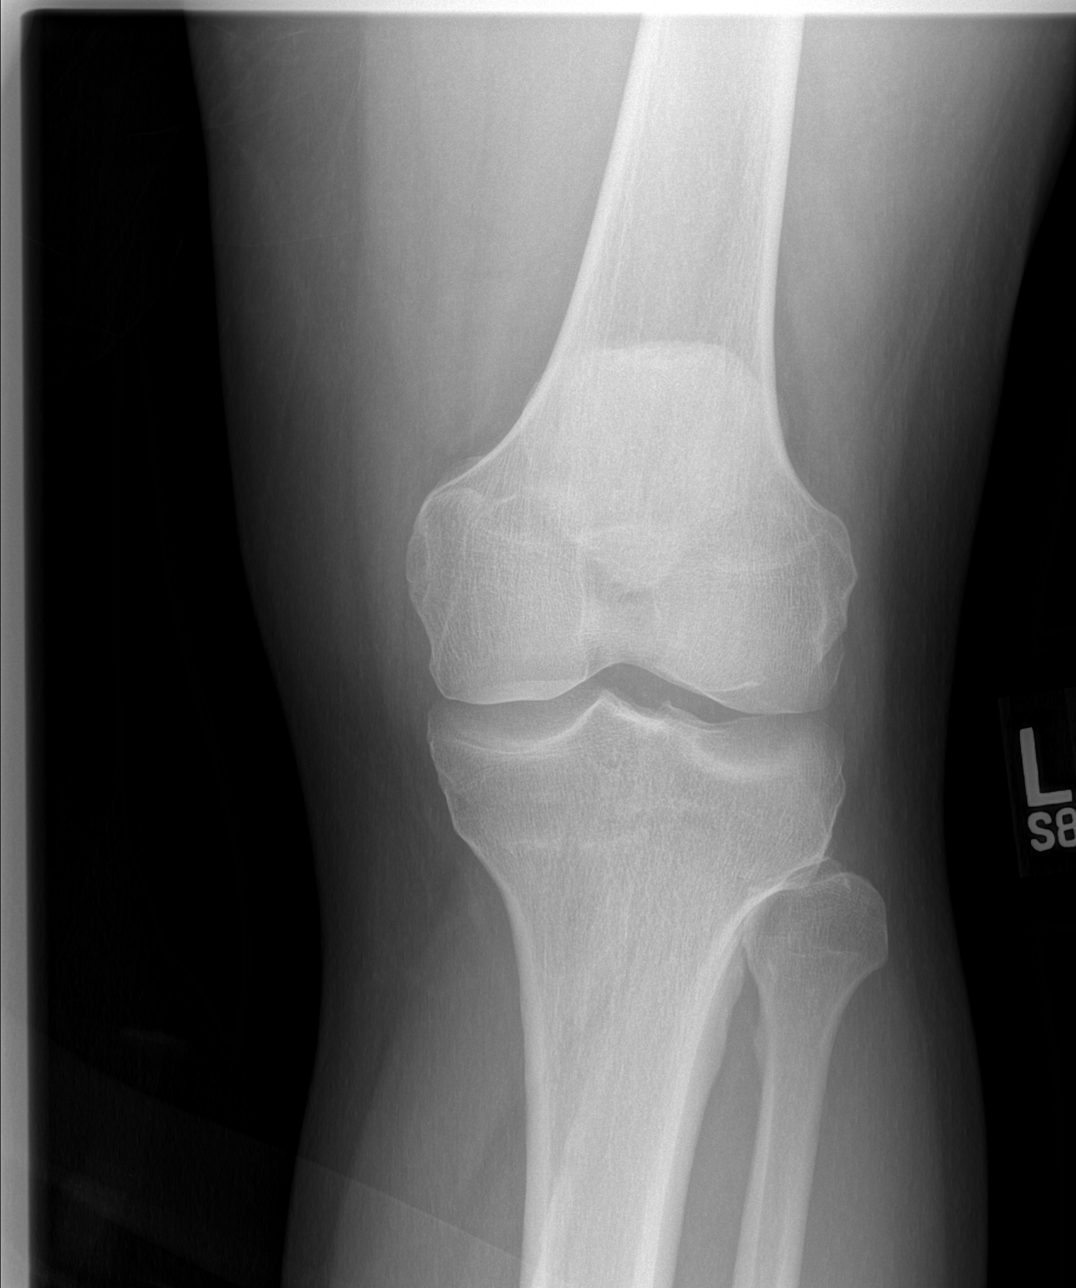

[t knee lat left]
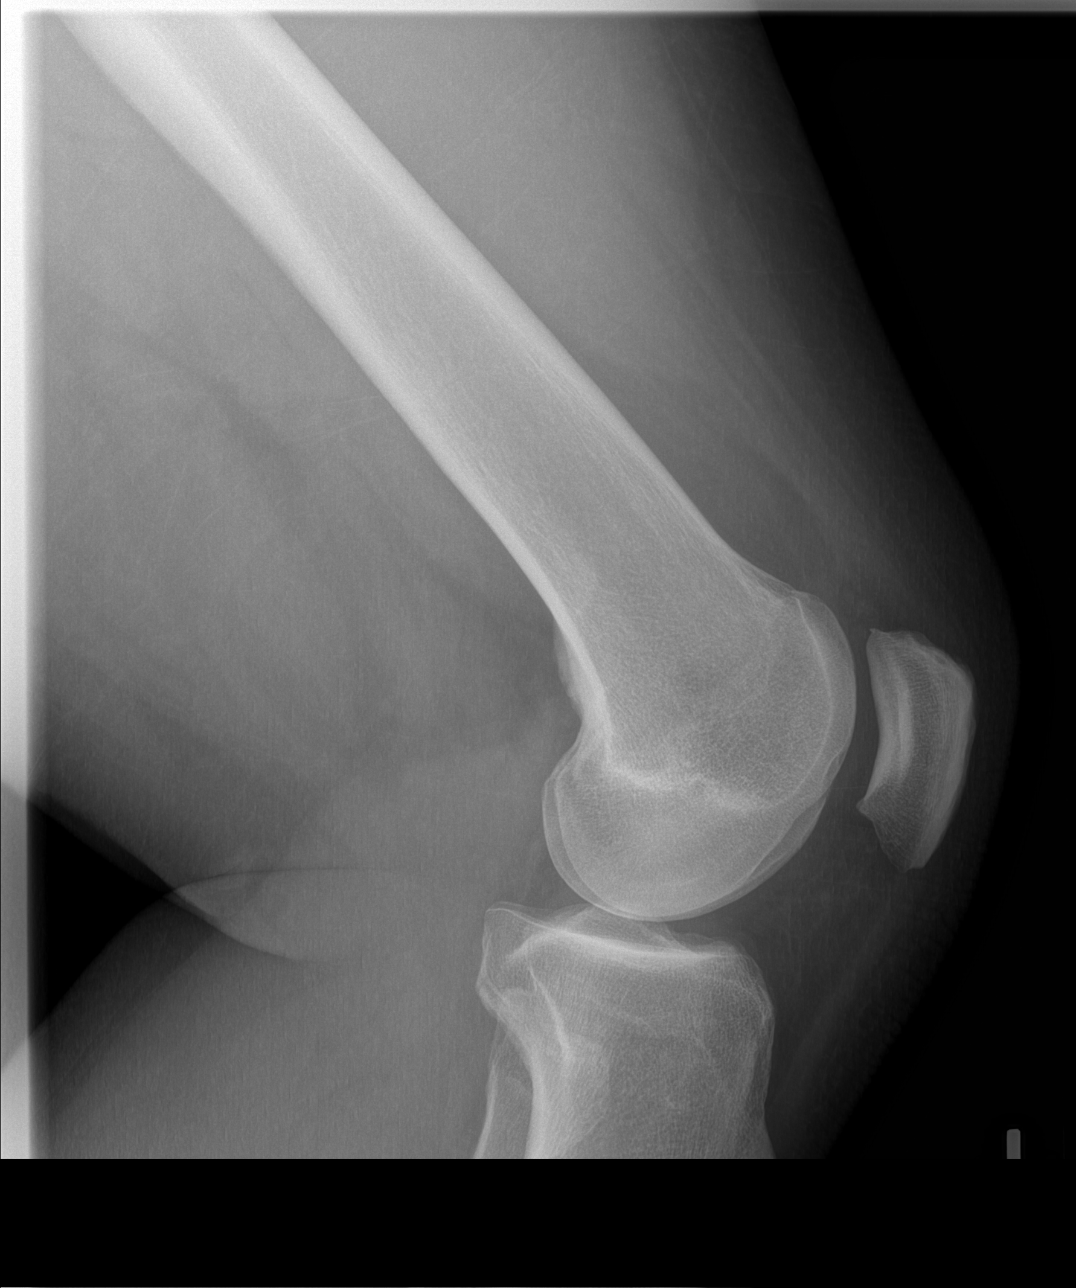

[4 of 4 positions shown; findings below may reference images not displayed]

FINDINGS: Mild patellofemoral spurring noted.  No knee effusion is
evident.  No fracture or acute bony findings are noted.
IMPRESSION: 1.  Mild patellofemoral spurring.

## 2010-07-19 NOTE — Progress Notes (Signed)
  Subjective:    [name], male    DOB: October 31, 1965, 45 y.o.   MRN: [id number]  HPI patient comes in today for a regular visit. He was seen about 3 weeks ago by Dr. Wendee Beavers. Patient has poorly controlled diabetes with A1c of more than 14.   Patient complains that he has been feeling bad since last 4 days, he has been drinking a lot of fluids, is being out of his insulin since Friday that is 5 days ago. His CBG in the clinic was unrecordable. Metabolic profile was drawn in the clinic and he was sent for his other doctors appointment until today but results were back. Patient came back at about 2 PM still feeling bad and wanting to go to emergency department. We repeated his CBG and it was still too high to be recorded.  Patient denied any chest pain, shortness of breath, abdominal pain, change in urinary habits. Patient does not have fever or chills. Patient does complain of some nausea but has not vomited.  Please see assessment and plan for further details     Review of Systems  Constitutional: Negative for fever, activity change and appetite change.  HENT: Negative for sore throat.   Respiratory: Negative for cough and shortness of breath.   Cardiovascular: Negative for chest pain and leg swelling.  Gastrointestinal: Negative for nausea, abdominal pain, diarrhea, constipation and abdominal distention.  Genitourinary: Negative for frequency, hematuria and difficulty urinating.  Neurological: Negative for dizziness and headaches.  Psychiatric/Behavioral: Negative for suicidal ideas and behavioral problems.       Objective:   Physical Exam  Constitutional: He is oriented to person, place, and time. He appears well-developed and well-nourished.  HENT:  Head: Normocephalic and atraumatic.  Eyes: Conjunctivae and EOM are normal. Pupils are equal, round, and reactive to light. No scleral icterus.  Neck: Normal range of motion. Neck supple. No JVD present. No thyromegaly  present.  Cardiovascular: Normal rate, regular rhythm, normal heart sounds and intact distal pulses.  Exam reveals no gallop and no friction rub.   No murmur heard. Pulmonary/Chest: Effort normal and breath sounds normal. No respiratory distress. He has no wheezes. He has no rales.  Abdominal: Soft. Bowel sounds are normal. He exhibits no distension and no mass. There is no tenderness. There is no rebound and no guarding.  Musculoskeletal: Normal range of motion. He exhibits no edema and no tenderness.  Lymphadenopathy:    He has no cervical adenopathy.  Neurological: He is alert and oriented to person, place, and time.  Psychiatric: He has a normal mood and affect. His behavior is normal.          Assessment & Plan:

## 2010-07-19 NOTE — Assessment & Plan Note (Signed)
Patient's diabetes is very poorly controlled and the Lantus is clearly not helping the cause. I think that he should either needs to divide his dose of Lantus in 2 to or needs to go back on 70/30 insulin because it is more effective introducing blood glucose levels. After discussion with Dr. Eppie Gibson he was decided that patient should be started on 20 units twice a day of insulin 70/30. I will follow him back in one week and go up on the insulin dosages based on his CBG readings.

## 2010-07-20 ENCOUNTER — Other Ambulatory Visit: Payer: Self-pay | Admitting: *Deleted

## 2010-07-20 ENCOUNTER — Inpatient Hospital Stay (INDEPENDENT_AMBULATORY_CARE_PROVIDER_SITE_OTHER)
Admission: RE | Admit: 2010-07-20 | Discharge: 2010-07-20 | Disposition: A | Discharge status: Home / Self Care | Payer: Medicaid Other | Source: Ambulatory Visit | Attending: Emergency Medicine | Admitting: Emergency Medicine

## 2010-07-20 DIAGNOSIS — L03019 Cellulitis of unspecified finger: Secondary | ICD-10-CM

## 2010-07-20 DIAGNOSIS — L02519 Cutaneous abscess of unspecified hand: Secondary | ICD-10-CM

## 2010-07-22 ENCOUNTER — Inpatient Hospital Stay (INDEPENDENT_AMBULATORY_CARE_PROVIDER_SITE_OTHER)
Admission: RE | Admit: 2010-07-22 | Discharge: 2010-07-22 | Disposition: A | Discharge status: Home / Self Care | Payer: Medicaid Other | Source: Ambulatory Visit | Attending: Family Medicine | Admitting: Family Medicine

## 2010-07-22 ENCOUNTER — Emergency Department (HOSPITAL_COMMUNITY)
Admission: EM | Admit: 2010-07-22 | Discharge: 2010-07-22 | Disposition: A | Discharge status: Home / Self Care | Payer: Medicaid Other | Attending: Emergency Medicine | Admitting: Emergency Medicine

## 2010-07-22 DIAGNOSIS — R7989 Other specified abnormal findings of blood chemistry: Secondary | ICD-10-CM

## 2010-07-22 DIAGNOSIS — Z21 Asymptomatic human immunodeficiency virus [HIV] infection status: Secondary | ICD-10-CM | POA: Insufficient documentation

## 2010-07-22 DIAGNOSIS — L03019 Cellulitis of unspecified finger: Secondary | ICD-10-CM | POA: Insufficient documentation

## 2010-07-22 DIAGNOSIS — I10 Essential (primary) hypertension: Secondary | ICD-10-CM | POA: Insufficient documentation

## 2010-07-22 DIAGNOSIS — M7989 Other specified soft tissue disorders: Secondary | ICD-10-CM | POA: Insufficient documentation

## 2010-07-22 DIAGNOSIS — L0291 Cutaneous abscess, unspecified: Secondary | ICD-10-CM

## 2010-07-22 DIAGNOSIS — M79609 Pain in unspecified limb: Secondary | ICD-10-CM | POA: Insufficient documentation

## 2010-07-22 DIAGNOSIS — Z862 Personal history of diseases of the blood and blood-forming organs and certain disorders involving the immune mechanism: Secondary | ICD-10-CM | POA: Insufficient documentation

## 2010-07-22 DIAGNOSIS — E669 Obesity, unspecified: Secondary | ICD-10-CM | POA: Insufficient documentation

## 2010-07-22 DIAGNOSIS — Z8639 Personal history of other endocrine, nutritional and metabolic disease: Secondary | ICD-10-CM | POA: Insufficient documentation

## 2010-07-22 DIAGNOSIS — E119 Type 2 diabetes mellitus without complications: Secondary | ICD-10-CM | POA: Insufficient documentation

## 2010-07-22 LAB — DIFFERENTIAL
Basophils Absolute: 0 10*3/uL (ref 0.0–0.1)
Basophils Relative: 0 % (ref 0–1)
Eosinophils Absolute: 0 10*3/uL (ref 0.0–0.7)
Eosinophils Relative: 0 % (ref 0–5)
Lymphocytes Relative: 27 % (ref 12–46)
Lymphs Abs: 2.6 10*3/uL (ref 0.7–4.0)
Monocytes Absolute: 0.6 10*3/uL (ref 0.1–1.0)
Monocytes Relative: 7 % (ref 3–12)
Neutro Abs: 6.3 10*3/uL (ref 1.7–7.7)
Neutrophils Relative %: 66 % (ref 43–77)

## 2010-07-22 LAB — CBC
HCT: 41.6 % (ref 39.0–52.0)
HCT: 41.4 % (ref 39.0–52.0)
Hemoglobin: 14.1 g/dL (ref 13.0–17.0)
Hemoglobin: 13.9 g/dL (ref 13.0–17.0)
MCH: 26.7 pg (ref 26.0–34.0)
MCH: 26.4 pg (ref 26.0–34.0)
MCHC: 33.9 g/dL (ref 30.0–36.0)
MCHC: 33.6 g/dL (ref 30.0–36.0)
MCV: 78.6 fL (ref 78.0–100.0)
MCV: 78.7 fL (ref 78.0–100.0)
Platelets: 194 10*3/uL (ref 150–400)
Platelets: 173 10*3/uL (ref 150–400)
RBC: 5.29 MIL/uL (ref 4.22–5.81)
RBC: 5.26 MIL/uL (ref 4.22–5.81)
RDW: 13.1 % (ref 11.5–15.5)
RDW: 13 % (ref 11.5–15.5)
WBC: 8.6 10*3/uL (ref 4.0–10.5)
WBC: 9.5 10*3/uL (ref 4.0–10.5)

## 2010-07-22 LAB — POCT I-STAT, CHEM 8
BUN: 11 mg/dL (ref 6–23)
BUN: 13 mg/dL (ref 6–23)
Calcium, Ion: 1.13 mmol/L (ref 1.12–1.32)
Calcium, Ion: 1.05 mmol/L — ABNORMAL LOW (ref 1.12–1.32)
Chloride: 97 mEq/L (ref 96–112)
Chloride: 99 mEq/L (ref 96–112)
Creatinine, Ser: 1.2 mg/dL (ref 0.4–1.5)
Creatinine, Ser: 1 mg/dL (ref 0.4–1.5)
Glucose, Bld: 436 mg/dL — ABNORMAL HIGH (ref 70–99)
Glucose, Bld: 380 mg/dL — ABNORMAL HIGH (ref 70–99)
HCT: 46 % (ref 39.0–52.0)
HCT: 46 % (ref 39.0–52.0)
Hemoglobin: 15.6 g/dL (ref 13.0–17.0)
Hemoglobin: 15.6 g/dL (ref 13.0–17.0)
Potassium: 4.2 mEq/L (ref 3.5–5.1)
Potassium: 4.3 mEq/L (ref 3.5–5.1)
Sodium: 133 mEq/L — ABNORMAL LOW (ref 135–145)
Sodium: 131 mEq/L — ABNORMAL LOW (ref 135–145)
TCO2: 27 mmol/L (ref 0–100)
TCO2: 26 mmol/L (ref 0–100)

## 2010-07-23 NOTE — Telephone Encounter (Signed)
Referred pt  To talk with his PCP at the [facility] for refills. Lorne Skeens, RN.

## 2010-08-01 ENCOUNTER — Telehealth: Payer: Self-pay | Admitting: *Deleted

## 2010-08-01 ENCOUNTER — Emergency Department (HOSPITAL_COMMUNITY)
Admission: EM | Admit: 2010-08-01 | Discharge: 2010-08-02 | Disposition: A | Discharge status: Home / Self Care | Payer: Medicaid Other | Attending: Emergency Medicine | Admitting: Emergency Medicine

## 2010-08-01 DIAGNOSIS — E119 Type 2 diabetes mellitus without complications: Secondary | ICD-10-CM

## 2010-08-01 DIAGNOSIS — I10 Essential (primary) hypertension: Secondary | ICD-10-CM | POA: Insufficient documentation

## 2010-08-01 DIAGNOSIS — Z21 Asymptomatic human immunodeficiency virus [HIV] infection status: Secondary | ICD-10-CM | POA: Insufficient documentation

## 2010-08-01 DIAGNOSIS — E1169 Type 2 diabetes mellitus with other specified complication: Secondary | ICD-10-CM | POA: Insufficient documentation

## 2010-08-01 DIAGNOSIS — R358 Other polyuria: Secondary | ICD-10-CM | POA: Insufficient documentation

## 2010-08-01 DIAGNOSIS — R3589 Other polyuria: Secondary | ICD-10-CM | POA: Insufficient documentation

## 2010-08-01 DIAGNOSIS — R112 Nausea with vomiting, unspecified: Secondary | ICD-10-CM | POA: Insufficient documentation

## 2010-08-01 DIAGNOSIS — R631 Polydipsia: Secondary | ICD-10-CM | POA: Insufficient documentation

## 2010-08-01 DIAGNOSIS — Z794 Long term (current) use of insulin: Secondary | ICD-10-CM | POA: Insufficient documentation

## 2010-08-01 LAB — URINALYSIS, ROUTINE W REFLEX MICROSCOPIC
Bilirubin Urine: NEGATIVE
Glucose, UA: >1000 mg/dL — AB
Hgb urine dipstick: NEGATIVE
Ketones, ur: 15 mg/dL — AB
Leukocytes, UA: NEGATIVE
Nitrite: NEGATIVE
Protein, ur: NEGATIVE mg/dL
Specific Gravity, Urine: 1.029 (ref 1.005–1.030)
Urobilinogen, UA: 0.2 mg/dL (ref 0.0–1.0)
pH: 6 (ref 5.0–8.0)

## 2010-08-01 LAB — GLUCOSE, CAPILLARY: Glucose-Capillary: >600 mg/dL (ref 70–99)

## 2010-08-01 LAB — URINE MICROSCOPIC-ADD ON: Urine-Other: NONE SEEN

## 2010-08-01 NOTE — Telephone Encounter (Signed)
Patients [facility] counselor adv that patient called her and is still having trouble getting his needles covered by medicaid. It has been 2 weeks that he has not had his insulin needles. I called the patients pharmacy to see what was going on and if they had any notion as to why it was not being paid. Pharmacy said they are getting a report that just says "Drug not covered" he also said he would call Medicaid and check with them and give [place] a call back. Adv him to let us know if there is anything we can do to help.

## 2010-08-02 ENCOUNTER — Emergency Department (HOSPITAL_COMMUNITY): Payer: Medicaid Other

## 2010-08-02 ENCOUNTER — Other Ambulatory Visit: Payer: Self-pay | Admitting: Licensed Clinical Social Worker

## 2010-08-02 ENCOUNTER — Telehealth: Payer: Self-pay | Admitting: Licensed Clinical Social Worker

## 2010-08-02 ENCOUNTER — Encounter: Payer: Self-pay | Admitting: Internal Medicine

## 2010-08-02 LAB — DIFFERENTIAL
Basophils Absolute: 0 10*3/uL (ref 0.0–0.1)
Basophils Relative: 0 % (ref 0–1)
Eosinophils Absolute: 0.1 10*3/uL (ref 0.0–0.7)
Eosinophils Relative: 1 % (ref 0–5)
Lymphocytes Relative: 39 % (ref 12–46)
Lymphs Abs: 2.2 10*3/uL (ref 0.7–4.0)
Monocytes Absolute: 0.5 10*3/uL (ref 0.1–1.0)
Monocytes Relative: 8 % (ref 3–12)
Neutro Abs: 2.9 10*3/uL (ref 1.7–7.7)
Neutrophils Relative %: 52 % (ref 43–77)

## 2010-08-02 LAB — BASIC METABOLIC PANEL
BUN: 8 mg/dL (ref 6–23)
CO2: 27 mEq/L (ref 19–32)
Calcium: 9.5 mg/dL (ref 8.4–10.5)
Chloride: 99 mEq/L (ref 96–112)
Creatinine, Ser: 0.77 mg/dL (ref 0.4–1.5)
GFR calc Af Amer: >60 mL/min (ref >60)
GFR calc non Af Amer: >60 mL/min (ref >60)
Glucose, Bld: 176 mg/dL — ABNORMAL HIGH (ref 70–99)
Potassium: 3.1 mEq/L — ABNORMAL LOW (ref 3.5–5.1)
Sodium: 135 mEq/L (ref 135–145)

## 2010-08-02 LAB — COMPREHENSIVE METABOLIC PANEL
ALT: 19 U/L (ref 0–53)
AST: 20 U/L (ref 0–37)
Albumin: 3.8 g/dL (ref 3.5–5.2)
Alkaline Phosphatase: 104 U/L (ref 39–117)
BUN: 11 mg/dL (ref 6–23)
CO2: 26 mEq/L (ref 19–32)
Calcium: 9.4 mg/dL (ref 8.4–10.5)
Chloride: 87 mEq/L — ABNORMAL LOW (ref 96–112)
Creatinine, Ser: 1.07 mg/dL (ref 0.4–1.5)
GFR calc Af Amer: >60 mL/min (ref >60)
GFR calc non Af Amer: >60 mL/min (ref >60)
Glucose, Bld: 719 mg/dL (ref 70–99)
Potassium: 4.3 mEq/L (ref 3.5–5.1)
Sodium: 125 mEq/L — ABNORMAL LOW (ref 135–145)
Total Bilirubin: 0.6 mg/dL (ref 0.3–1.2)
Total Protein: 8.2 g/dL (ref 6.0–8.3)

## 2010-08-02 LAB — POCT I-STAT 3, VENOUS BLOOD GAS (G3P V)
Acid-Base Excess: 3 mmol/L — ABNORMAL HIGH (ref 0.0–2.0)
Bicarbonate: 30.6 mEq/L — ABNORMAL HIGH (ref 20.0–24.0)
O2 Saturation: 20 %
TCO2: 32 mmol/L (ref 0–100)
pCO2, Ven: 54.8 mmHg — ABNORMAL HIGH (ref 45.0–50.0)
pH, Ven: 7.356 — ABNORMAL HIGH (ref 7.250–7.300)
pO2, Ven: 16 mmHg — CL (ref 30.0–45.0)

## 2010-08-02 LAB — CBC
HCT: 38.7 % — ABNORMAL LOW (ref 39.0–52.0)
Hemoglobin: 13.6 g/dL (ref 13.0–17.0)
MCH: 26.9 pg (ref 26.0–34.0)
MCHC: 35.1 g/dL (ref 30.0–36.0)
MCV: 76.6 fL — ABNORMAL LOW (ref 78.0–100.0)
Platelets: 235 10*3/uL (ref 150–400)
RBC: 5.05 MIL/uL (ref 4.22–5.81)
RDW: 12.9 % (ref 11.5–15.5)
WBC: 5.5 10*3/uL (ref 4.0–10.5)

## 2010-08-02 LAB — GLUCOSE, CAPILLARY
Glucose-Capillary: 497 mg/dL — ABNORMAL HIGH (ref 70–99)
Glucose-Capillary: 461 mg/dL — ABNORMAL HIGH (ref 70–99)
Glucose-Capillary: 429 mg/dL — ABNORMAL HIGH (ref 70–99)
Glucose-Capillary: 306 mg/dL — ABNORMAL HIGH (ref 70–99)
Glucose-Capillary: 203 mg/dL — ABNORMAL HIGH (ref 70–99)
Glucose-Capillary: 159 mg/dL — ABNORMAL HIGH (ref 70–99)

## 2010-08-02 NOTE — Telephone Encounter (Signed)
The patient reports unable to get his insulin even with Medicaid.  Called Pharmacist at [facility])  [phone] and he informs me that there was a problem getting pt his needles because he did not fill his insulin for the prior 90 days.  Dr. Lynnae January signed RX on 4/9 for Lantus 90 units daily. 4/25: [facility] has two boxes of insulin with needles ready which will take him through to 28 days.   The copay will be $3 for Lantus and 0 for the needles.   Paged Dr. Kelton Pillar in ED and updated him re: situation.  Suggested pt connect with [facility] adherence nurse for disease and med mgmt.  Dr. Kelton Pillar said he will call ID and make recommendation for adherence nurse.

## 2010-08-02 NOTE — H&P (Signed)
Hospital Admission Note Date: 08/02/2010  Patient name: Keith [name] Medical record number: [name] Date of birth: 26-Dec-1965 Age: 45 y.o. Gender: male PCP: Ludwig Lean, MD  Medical Service: Internal Medicine  Attending physician:  Dr. Lars Mage   Pager: Resident [phone]): Dr. Kelton Pillar    [id number] Resident (R1): Dr. Obie Dredge     [id number]  Chief Complaint: high blood sugar  History of Present Illness: Keith [name] is a 45 yo man with PMH of HIV/AIDS, DM, HTN and medication non-compliance presents for weakness and hyperglycemia.  He reports not taking his insulin in the past 2 weeks because Medicaid would not pay for his needles.  Patient admits to vomiting last week without any obvious blood, and has increase in urinary frequency but denies any burning or dysuria.  Denies any SOB, chestpain, , abdominal pain, nausea, fever, chills.  He also reports not taking his HIV medications in the past several weeks because of insurance issues.    Current Outpatient Prescriptions  Medication Sig Dispense Refill  . aspirin 81 MG EC tablet Take 81 mg by mouth daily.        Marland Kitchen atazanavir (REYATAZ) 300 MG capsule Take 300 mg by mouth daily.        Marland Kitchen atenolol (TENORMIN) 50 MG tablet Take 50 mg by mouth daily.        . dapsone 100 MG tablet Take 100 mg by mouth daily.        Marland Kitchen emtricitabine-tenofovir (TRUVADA) 200-300 MG per tablet Take 1 tablet by mouth daily.        Marland Kitchen glucose blood (ACCU-CHEK AVIVA PLUS) test strip Use to check blood sugar twice a day - morning and evening before eating- when you give the Lantus insulin       . insulin NPH-insulin regular (NOVOLIN 70/30) (70-30) 100 UNIT/ML injection Inject 30 units in the morning and 30 units at night 15-30 minutes before breakfast and supper respectively.  10 mL  12  . Insulin Pen Needle (PEN NEEDLES) 31G X 6 MM MISC 1 each by Does not apply route daily. Use to inject insulin one time a day  100 each  11  . Lancets (ACCU-CHEK  MULTICLIX) lancets Use to check blood sugar up to 2 times daily       . lisinopril (PRINIVIL,ZESTRIL) 40 MG tablet Take 40 mg by mouth daily.        . metFORMIN (GLUCOPHAGE) 1000 MG tablet Take 1,000 mg by mouth 2 (two) times daily.        . ritonavir (NORVIR) 100 MG TABS Take 100 mg by mouth daily.        Marland Kitchen testosterone (ANDROGEL) 50 MG/5GM GEL Place onto the skin daily. Apply one packet every day       . zidovudine (RETROVIR) 300 MG tablet Take 300 mg by mouth 2 (two) times daily.          Allergies: Sulfonamide derivatives-itching  Past Medical History  Diagnosis Date  . HIV (human immunodeficiency virus infection)     CD4 count 100, VL 13800 (05/01/2010)  . Diabetes type 2, uncontrolled     HgA1c 17.6 (04/27/2010)  . Hypertension   . Genital warts   . Erectile dysfunction   . Chronic knee pain     right  . Osteomyelitis     h/o hand    Past surgical history: None  Family History  Problem Relation Age of Onset  . Hypertension Mother   . Arthritis Father   .  Hypertension Father   . Hypertension Brother   . Cancer Maternal Grandmother 69    unknown type of cancer  . Depression Paternal Grandmother    Social History: Currently on disability, has 2 children, on [place]. Denies any smoking, alcohol, or recreational drug  Review of Systems: As per HPI; otherwise neg ROS.  Physical Exam:    Lab results:  Sodium (NA)                              125        l      135-145          mEq/L  Potassium (K)                            4.3               3.5-5.1          mEq/L  Chloride                                 87         l      96-112           mEq/L  CO2                                      26                19-32            mEq/L  Glucose                                  719        H      70-99            mg/dL    RESULTS CONFIRMED BY MANUAL DILUTION    CRITICAL RESULT CALLED TO, READ BACK BY AND VERIFIED WITH:    J SNYDER,RN 08/02/10 AT 0145 BY J HICKS  BUN                                       11                6-23             mg/dL  Creatinine                               1.07              0.4-1.5          mg/dL  GFR, Est Non African American            >60               >60              mL/min  GFR, Est African American                >60               >  60              mL/min    Oversized comment, see footnote  1  Bilirubin, Total                         0.6               0.3-1.2          mg/dL  Alkaline Phosphatase                     104               39-117           U/L  SGOT (AST)                               20                0-37             U/L  SGPT (ALT)                               19                0-53             U/L  Total  Protein                           8.2               6.0-8.3          g/dL    POST-ULTRACENTRIFUGATION  Albumin-Blood                            3.8               3.5-5.2          g/dL  Calcium                                  9.4               8.4-10.5         Mg/dL  WBC                                      5.5               4.0-10.5         K/uL  RBC                                      5.05              4.22-5.81        MIL/uL  Hemoglobin (HGB)                         13.6              13.0-17.0  g/dL  Hematocrit (HCT)                         38.7       l      39.0-52.0        %  MCV                                      76.6       l      78.0-100.0       fL  MCH -                                    26.9              26.0-34.0        pg  MCHC                                     35.1              30.0-36.0        g/dL  RDW                                      12.9              11.5-15.5        %  Platelet Count (PLT)                     235               150-400          K/uL  Neutrophils, %                           52                43-77            %  Lymphocytes, %                           39                12-46            %  Monocytes, %                             8                 3-12              %  Eosinophils, %                           1                 0-5              %  Basophils, %  0                 0-1              %  Neutrophils, Absolute                    2.9               1.7-7.7          K/uL  Lymphocytes, Absolute                    2.2               0.7-4.0          K/uL  Monocytes, Absolute                      0.5               0.1-1.0          K/uL  Eosinophils, Absolute                    0.1               0.0-0.7          K/uL  Basophils, Absolute                      0.0               0.0-0.1          K/uL Color, Urine                             YELLOW            YELLOW  Appearance                               CLEAR             CLEAR  Specific Gravity                         1.029             1.005-1.030  pH                                       6.0               5.0-8.0  Urine Glucose                            >1000      a      NEG              mg/dL  Bilirubin                                NEGATIVE          NEG  Ketones                                  15  a      NEG              mg/dL  Blood                                    NEGATIVE          NEG  Protein                                  NEGATIVE          NEG              mg/dL  Urobilinogen                             0.2               0.0-1.0          mg/dL  Nitrite                                  NEGATIVE          NEG  Leukocytes                               NEGATIVE          NEG  Microscopy: Urine-Other                              SEE NOTE.    NO FORMED ELEMENTS SEEN ON URINE MICROSCOPIC EXAMINATION  ABG- VENOUS pH, Venous                               7.356      h      7.250-7.300  pCO2, Venous                             54.8       h      45.0-50.0        mmHg  pO2, Venous                              16.0       L      30.0-45.0        mmHg  I-STAT-Comment                           SEE NOTE.    NOTIFIED PHYSICIAN  Bicarbonate                               30.6       h      20.0-24.0        mEq/L  TCO2  32                0-100            mmol/L  Acid-Base Excess                         3.0        h      0.0-2.0          mmol/L  Oxygen Saturation                        20.0                               %  Sample Type                              VENOUS  Imagings: CXR: pending  Assessment & Plan by Problem:  1. Hyperglycemia:  Patient meets criteria for HHS including glu>600, serum Bicarb >15, minimal ketonuria of 15; although he does have anion gap of 22 but his Bicarb is 26, patient is not acidotic.  This is likely secondary to medication non-compliance but will need to rule out other precipitation factors such as infection (however, UA is clear and CXR is pending), inflammatory process (no abdominal tenderness to suggest pancreatitis/cholecystits).  Patient received 1.5L NS in ED and then 250 cc/hr  , - Will admit to SDU - Aggressive IVF: NS at 261mL/hr, then transition to D5 1/2NS once CBGs 250-300. - Bmet Q2, CBG Q1 - Cycle cardiac enzymes to eval for ischemia, EKG -Replete K per protocol -Will get CXR to rule out respiratory infection -Check serum ketones  (2) HIV/AIDS- last CD4 count was 100 , VL 13,800 in 04/2010.  Genotype in 04/2009 showed resistant to Lamivudine and Emtricitabine and V106I, M184V, and L10V mutations; however, recent genotype 04/2010 showed no evidence of resistance or mutation -Will continue current HAART regimen: Reyataz, Truvada, Norvir, and Retrovir  -Continue PCP prophylaxis with Dapsone -Will check CD4, VL  (3) Pseudohyponatremia: 2/2 to hyperglycemia.  Corrected Na is 135.  VTE prophylaxis: Lovenox 30mg  [place] daily  R1, Rosland Riding Ho_______________________________[phone]  R2, Mona Sidhu______________________________[phone]   (4) HTN: stable.  Will continue Lisinopril 40mg  po qdaily

## 2010-08-02 NOTE — H&P (Signed)
Hospital Admission Note Date: 08/02/2010  Patient name: Keith [name] Medical record number: [id number] Date of birth: June 27, 1965 Age: 45 y.o. Gender: male PCP: VEGA-CASASNOVAS,RAMESES, MD  Medical Service: Internal Medicine  Attending physician:  Dr. Lars Mage   Pager: Resident [phone]): Dr. Kelton Pillar    [id number] Resident (R1): Dr. Obie Dredge     [id number]  Chief Complaint: high blood sugar  History of Present Illness: Keith [name] is a 45 yo man with PMH of HIV/AIDS, DM, HTN and medication non-compliance with multiple admissions for [facility] presents for fatigue and hyperglycemia.  He reports not taking his insulin in the past 2 weeks because Medicaid would not pay for his needles.  Patient admits to vomiting last week without any obvious blood, and has increase in urinary frequency but denies any burning or dysuria.  Denies any SOB, chestpain, , abdominal pain, nausea, fever, chills.  He also reports not taking his HIV medications in the past several weeks because of insurance issues.    Current Outpatient Prescriptions  Medication Sig Dispense Refill  . aspirin 81 MG EC tablet Take 81 mg by mouth daily.        Marland Kitchen atazanavir (REYATAZ) 300 MG capsule Take 300 mg by mouth daily.        Marland Kitchen atenolol (TENORMIN) 50 MG tablet Take 50 mg by mouth daily.        . dapsone 100 MG tablet Take 100 mg by mouth daily.        Marland Kitchen emtricitabine-tenofovir (TRUVADA) 200-300 MG per tablet Take 1 tablet by mouth daily.        Marland Kitchen glucose blood (ACCU-CHEK AVIVA PLUS) test strip Use to check blood sugar twice a day - morning and evening before eating- when you give the Lantus insulin       . insulin NPH-insulin regular (NOVOLIN 70/30) (70-30) 100 UNIT/ML injection Inject 30 units in the morning and 30 units at night 15-30 minutes before breakfast and supper respectively.  10 mL  12  . Insulin Pen Needle (PEN NEEDLES) 31G X 6 MM MISC 1 each by Does not apply route daily. Use to inject insulin one time a day  100 each   11  . Lancets (ACCU-CHEK MULTICLIX) lancets Use to check blood sugar up to 2 times daily       . lisinopril (PRINIVIL,ZESTRIL) 40 MG tablet Take 40 mg by mouth daily.        . metFORMIN (GLUCOPHAGE) 1000 MG tablet Take 1,000 mg by mouth 2 (two) times daily.        . ritonavir (NORVIR) 100 MG TABS Take 100 mg by mouth daily.        Marland Kitchen testosterone (ANDROGEL) 50 MG/5GM GEL Place onto the skin daily. Apply one packet every day       . zidovudine (RETROVIR) 300 MG tablet Take 300 mg by mouth 2 (two) times daily.          Allergies: Sulfonamide derivatives-itching  Past Medical History  Diagnosis Date  . HIV (human immunodeficiency virus infection)     CD4 count 100, VL 13800 (05/01/2010)  . Diabetes type 2, uncontrolled     HgA1c 17.6 (04/27/2010)  . Hypertension   . Genital warts   . Erectile dysfunction   . Chronic knee pain     right  . Osteomyelitis     h/o hand    Past surgical history: None  Family History  Problem Relation Age of Onset  . Hypertension Mother   .  Arthritis Father   . Hypertension Father   . Hypertension Brother   . Cancer Maternal Grandmother 39    unknown type of cancer  . Depression Paternal Grandmother    Social History: Currently on disability, has 2 children, on [place]. Denies any smoking, alcohol, or recreational drug  Review of Systems: As per HPI; otherwise neg ROS.  Physical Exam: General:  alert, obese (BMI 41), and cooperative to examination, NAD.   Head:  normocephalic and atraumatic.   Eyes:  vision grossly intact, pupils equal, pupils round, pupils reactive to light, no injection and anicteric.   Mouth:  pharynx pink and dry, no erythema, and no exudates or oral thrush.   Neck:  supple, full ROM, no thyromegaly, no JVD, and no carotid bruits.   Lungs:  normal respiratory effort, no accessory muscle use, normal breath sounds, no crackles, and no wheezes.   Heart:  normal rate, regular rhythm, no murmur, no gallop, and no rub.     Abdomen:  soft, non-tender, normal bowel sounds, no distention, no guarding Extremities:  No cyanosis, clubbing, edema  Neurologic:  alert & oriented X3, cranial nerves II-XII intact, strength normal in all extremities, sensation intact to light touch, and gait normal.   Skin: xeroderma on LE bilaterally  Lab results:   Sodium (NA)                              125        l      135-145          mEq/L  Potassium (K)                            4.3               3.5-5.1          mEq/L  Chloride                                 87         l      96-112           mEq/L  CO2                                      26                19-32            mEq/L  Glucose                                  719        H      70-99            mg/dL    RESULTS CONFIRMED BY MANUAL DILUTION    CRITICAL RESULT CALLED TO, READ BACK BY AND VERIFIED WITH:    J SNYDER,RN 08/02/10 AT 0145 BY J HICKS  BUN                                      11  6-23             mg/dL  Creatinine                               1.07              0.4-1.5          mg/dL  GFR, Est Non African American            >60               >60              mL/min  GFR, Est African American                >60               >60              mL/min    Oversized comment, see footnote  1  Bilirubin, Total                         0.6               0.3-1.2          mg/dL  Alkaline Phosphatase                     104               39-117           U/L  SGOT (AST)                               20                0-37             U/L  SGPT (ALT)                               19                0-53             U/L  Total  Protein                           8.2               6.0-8.3          g/dL    POST-ULTRACENTRIFUGATION  Albumin-Blood                            3.8               3.5-5.2          g/dL  Calcium                                  9.4               8.4-10.5         Mg/dL  WBC  5.5                4.0-10.5         K/uL  RBC                                      5.05              4.22-5.81        MIL/uL  Hemoglobin (HGB)                         13.6              13.0-17.0        g/dL  Hematocrit (HCT)                         38.7       l      39.0-52.0        %  MCV                                      76.6       l      78.0-100.0       fL  MCH -                                    26.9              26.0-34.0        pg  MCHC                                     35.1              30.0-36.0        g/dL  RDW                                      12.9              11.5-15.5        %  Platelet Count (PLT)                     235               150-400          K/uL  Neutrophils, %                           52                43-77            %  Lymphocytes, %                           39                12-46            %  Monocytes, %  8                 3-12             %  Eosinophils, %                           1                 0-5              %  Basophils, %                             0                 0-1              %  Neutrophils, Absolute                    2.9               1.7-7.7          K/uL  Lymphocytes, Absolute                    2.2               0.7-4.0          K/uL  Monocytes, Absolute                      0.5               0.1-1.0          K/uL  Eosinophils, Absolute                    0.1               0.0-0.7          K/uL  Basophils, Absolute                      0.0               0.0-0.1          K/uL Color, Urine                             YELLOW            YELLOW  Appearance                               CLEAR             CLEAR  Specific Gravity                         1.029             1.005-1.030  pH                                       6.0               5.0-8.0  Urine Glucose                            >  1000      a      NEG              mg/dL  Bilirubin                                NEGATIVE          NEG  Ketones                                   15         a      NEG              mg/dL  Blood                                    NEGATIVE          NEG  Protein                                  NEGATIVE          NEG              mg/dL  Urobilinogen                             0.2               0.0-1.0          mg/dL  Nitrite                                  NEGATIVE          NEG  Leukocytes                               NEGATIVE          NEG  Microscopy: Urine-Other                              SEE NOTE.    NO FORMED ELEMENTS SEEN ON URINE MICROSCOPIC EXAMINATION  ABG- VENOUS pH, Venous                               7.356      h      7.250-7.300  pCO2, Venous                             54.8       h      45.0-50.0        mmHg  pO2, Venous                              16.0       L      30.0-45.0        mmHg  I-STAT-Comment  SEE NOTE.    NOTIFIED PHYSICIAN  Bicarbonate                              30.6       h      20.0-24.0        mEq/L  TCO2                                     32                0-100            mmol/L  Acid-Base Excess                         3.0        h      0.0-2.0          mmol/L  Oxygen Saturation                        20.0                               %  Sample Type                              VENOUS  Imagings: CXR: pending  Assessment & Plan by Problem:  1. Hyperglycemia:  Patient meets criteria for HHS including glu>600, serum Bicarb >15, minimal ketonuria of 15; although he does have anion gap of 22 but his Bicarb is 26 and 30 on venous blood gas, patient is not acidotic.  This is likely secondary to medication non-compliance but will need to rule out other precipitation factors such as infection (however, UA is clear and CXR is pending), inflammatory process (no abdominal tenderness to suggest pancreatitis/cholecystits).  Patient received 1.5L NS in ED and then 250 cc/hr and CBGs trended down from 719--> 432. - Will admit to SDU - Aggressive IVF: NS at 244mL/hr, then  transition to D5 1/2NS once CBGs 250-300. - Bmet Q2, CBG Q1 - Cycle cardiac enzymes to eval for ischemia, EKG -Replete K per protocol -Will get CXR to rule out respiratory infection -Check serum ketones  (2) HIV/AIDS- last CD4 count was 100 , VL 13,800 in 04/2010.  Genotype in 04/2009 showed resistant to Lamivudine and Emtricitabine and V106I, M184V, and L10V mutations; however, recent genotype 04/2010 showed no evidence of resistance but has L10V mutation. History of non-compliance. -Will continue current HAART regimen: Reyataz, Truvada, Norvir, and Retrovir  -Continue PCP prophylaxis with Dapsone -Will check CD4, VL  (3) Pseudohyponatremia: 2/2 to hyperglycemia.  Corrected Na is 135.  (4) HTN: stable.  Will continue Lisinopril 40mg  po qdaily  VTE prophylaxis: Lovenox 30mg  [place] daily   R1, Tionne Dayhoff Ho_______________________________[phone]  Guadalupe Dawn, Mona Sidhu______________________________[phone]

## 2010-08-02 NOTE — Discharge Summary (Unsigned)
The patient was actually discharged from the emergency department. His blood sugars were well controlled in the ED. He did not have any evidence of ketosis and he was asymptomatic. The reason why he was not getting his prescriptions from the pharmacy was that he had not refilled his Lantus in last 3 months continue to request Lantus pen needle refills, and the insurance would not pay for Lantus pen needle without regular Lantus reflexes. After calling the pharmacy, we can phone that he had a prescription of Lantus and pen needles ready to be picked up. The Lantus were costing $3 per month and pen needles would be free. We also ask the social worker to come and see the patient in the emergency department. They assess the patient's housing condition and social support. It seems like patient has a low IQ and difficulty understanding his medications and appropriate administration. This suggested that an assisted living facility might be a good idea for this patient although it is not really clear if he will qualify for it. They state that they will walk on it this week and let Tommi Emery T know about it and they have made some progress. This will need to be followed up during his outpatient clinic visit.  We explained all this to the patient. He cites understanding. Please also check his BMET and CBG during his outpatient clinic visit

## 2010-08-03 LAB — URINE CULTURE
Colony Count: NO GROWTH
Culture  Setup Time: 201204260920
Culture: NO GROWTH

## 2010-08-05 ENCOUNTER — Emergency Department (HOSPITAL_COMMUNITY)
Admission: EM | Admit: 2010-08-05 | Discharge: 2010-08-05 | Disposition: A | Discharge status: Home / Self Care | Payer: Medicaid Other | Attending: Emergency Medicine | Admitting: Emergency Medicine

## 2010-08-05 DIAGNOSIS — R229 Localized swelling, mass and lump, unspecified: Secondary | ICD-10-CM | POA: Insufficient documentation

## 2010-08-05 DIAGNOSIS — E119 Type 2 diabetes mellitus without complications: Secondary | ICD-10-CM | POA: Insufficient documentation

## 2010-08-05 DIAGNOSIS — L03019 Cellulitis of unspecified finger: Secondary | ICD-10-CM | POA: Insufficient documentation

## 2010-08-05 DIAGNOSIS — I10 Essential (primary) hypertension: Secondary | ICD-10-CM | POA: Insufficient documentation

## 2010-08-05 DIAGNOSIS — Z21 Asymptomatic human immunodeficiency virus [HIV] infection status: Secondary | ICD-10-CM | POA: Insufficient documentation

## 2010-08-05 DIAGNOSIS — M79609 Pain in unspecified limb: Secondary | ICD-10-CM | POA: Insufficient documentation

## 2010-08-08 IMAGING — CR DG CHEST 2V
2 series · 2 of 2 positions shown · non-contrast
Comparison: 08/06/2009

CLINICAL DATA: Hyperglycemia.  Short of breath.

CHEST - 2 VIEW

[w chest pa]
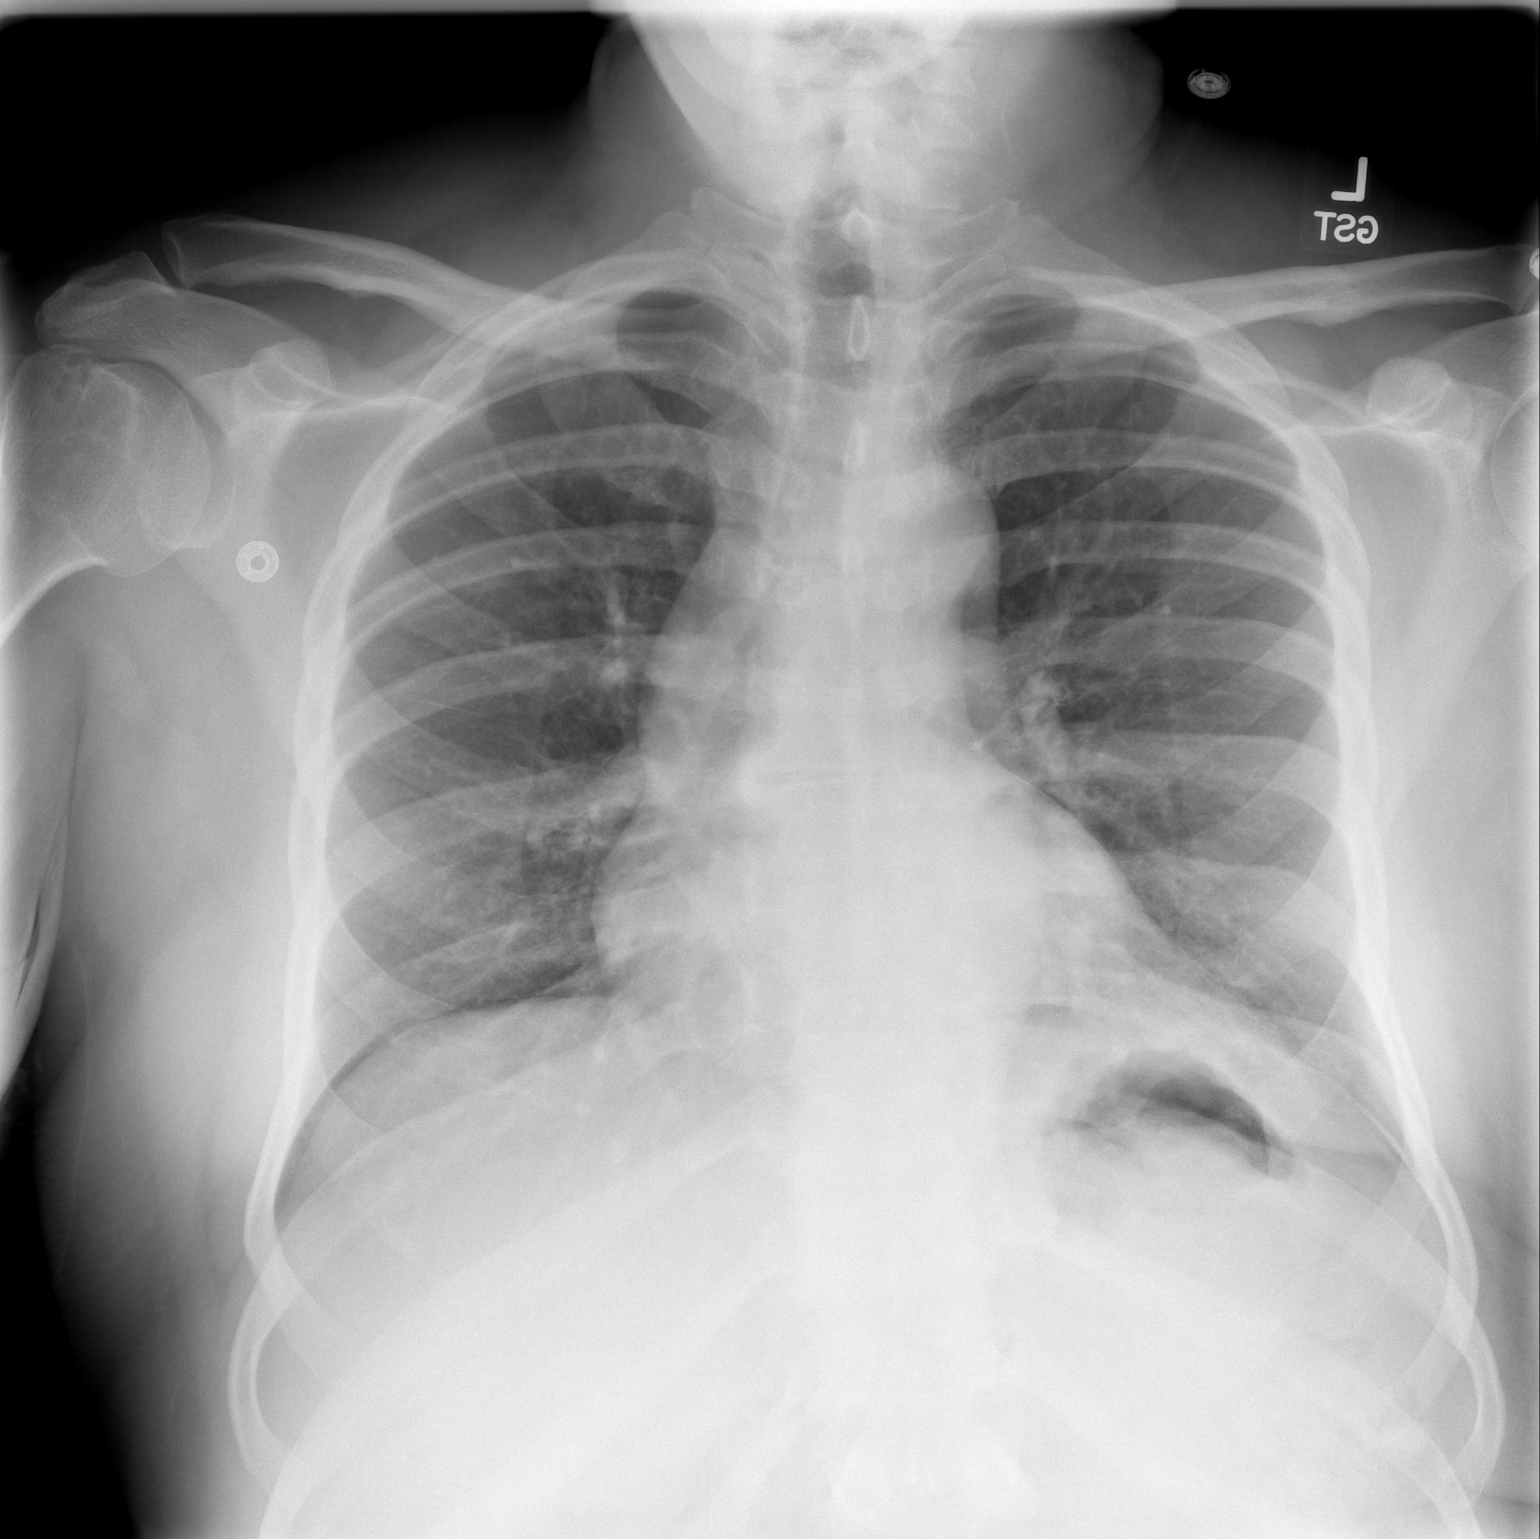

[w chest lat]
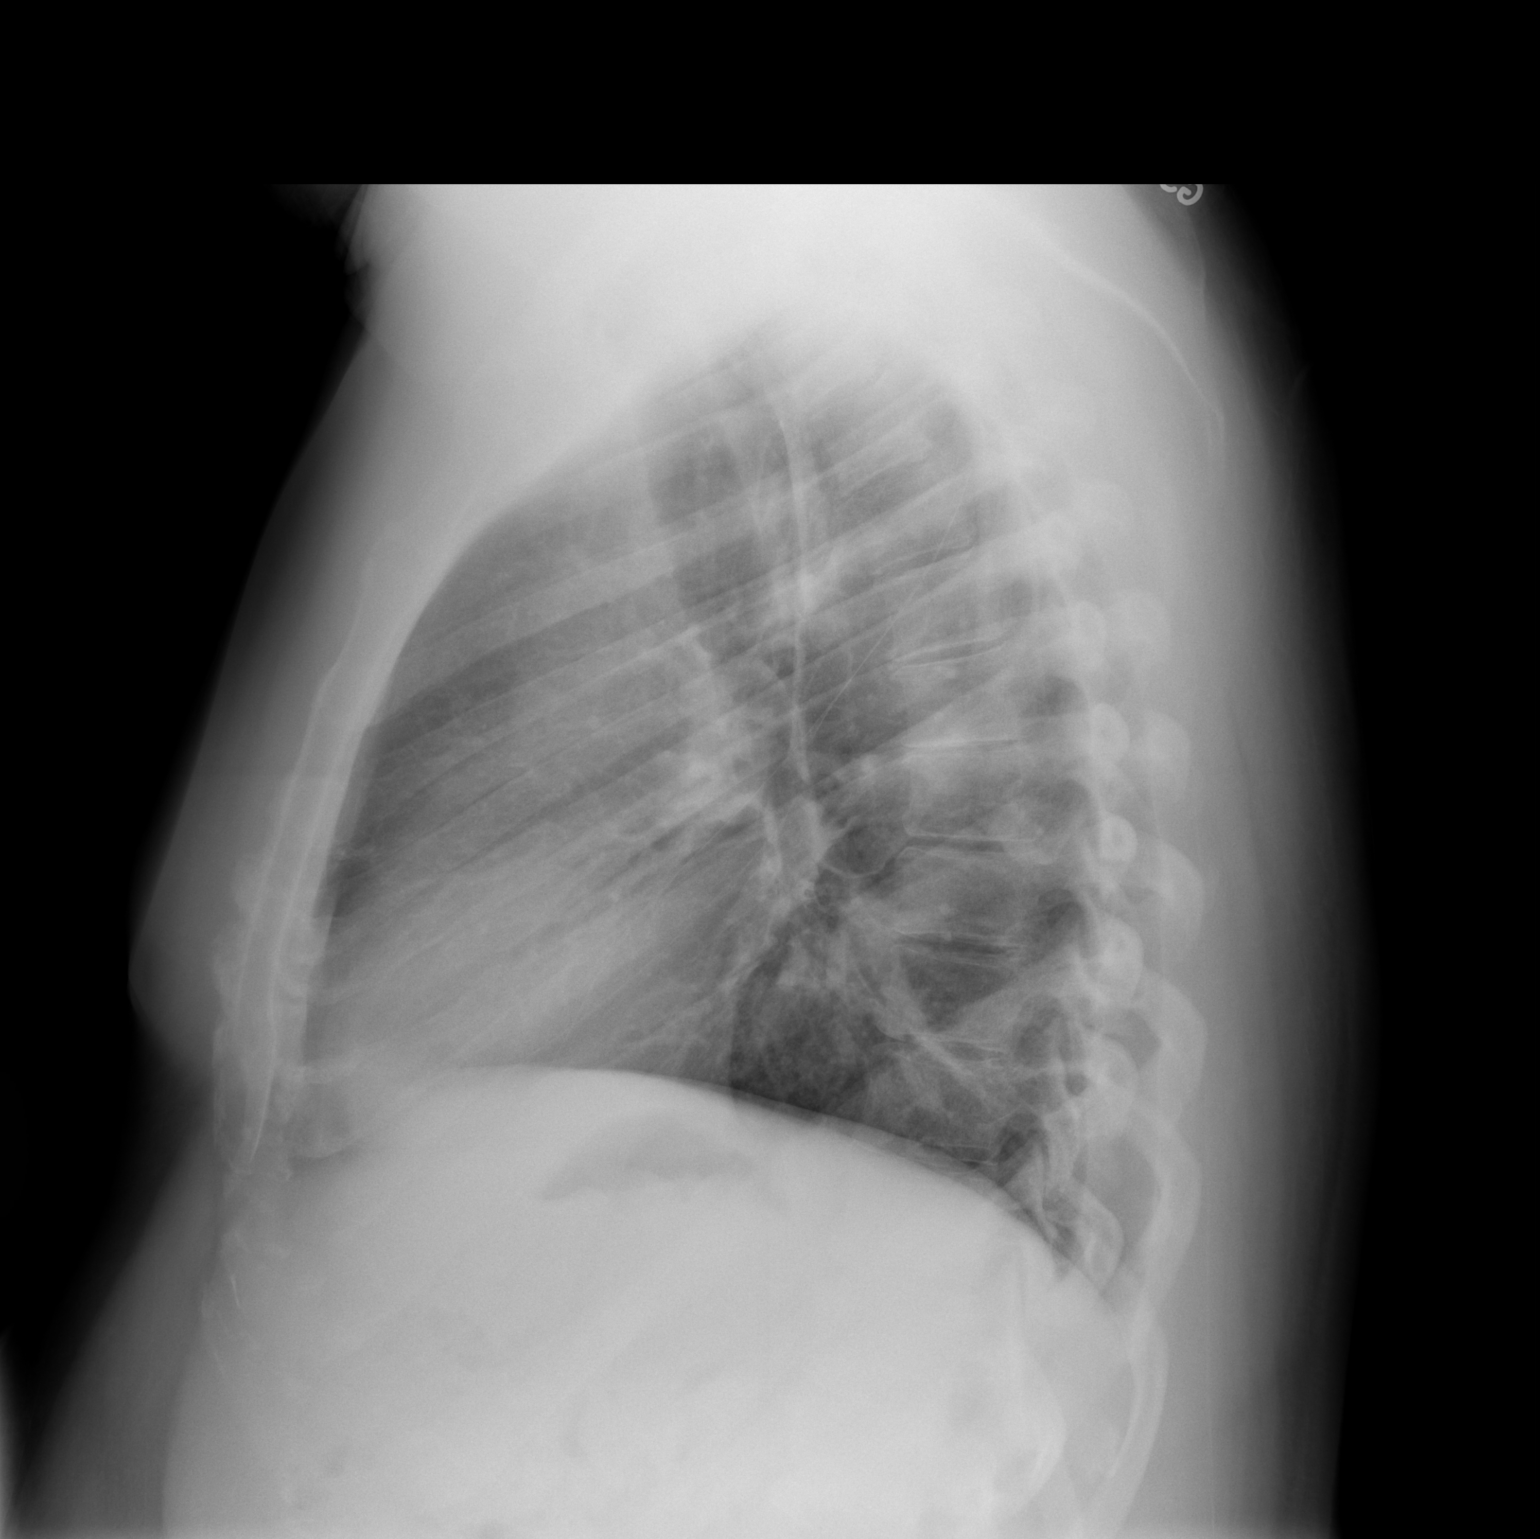

[2 of 2 positions shown; findings below may reference images not displayed]

FINDINGS: Midline trachea.  Normal heart size.  The aorta is mildly
tortuous. No pleural effusion or pneumothorax.  Lung volumes are
mildly low.  Mild volume loss/atelectasis at the left lung base.
IMPRESSION: 1. No acute cardiopulmonary disease.
2.  Mild left base atelectasis.

## 2010-08-09 IMAGING — CR DG KNEE 1-2V*L*
2 series · 2 of 2 positions shown · non-contrast
Comparison: 08/22/2009

CLINICAL DATA: Chronic left knee pain.

LEFT KNEE - 1-2 VIEW

[t knee ap left]
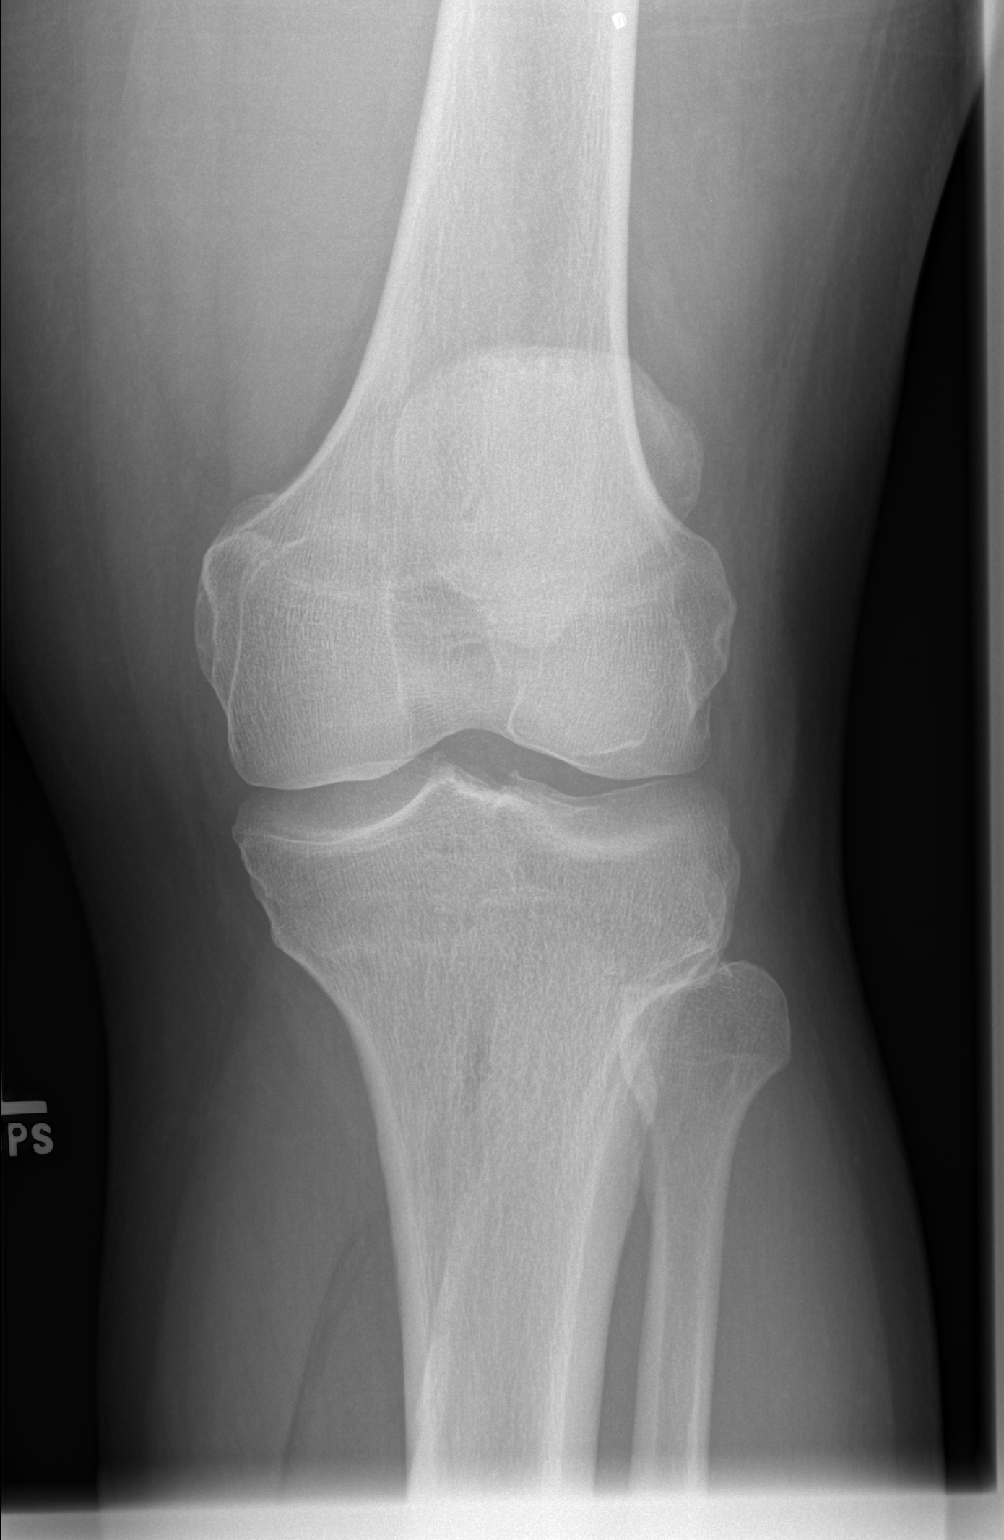

[t knee lat left]
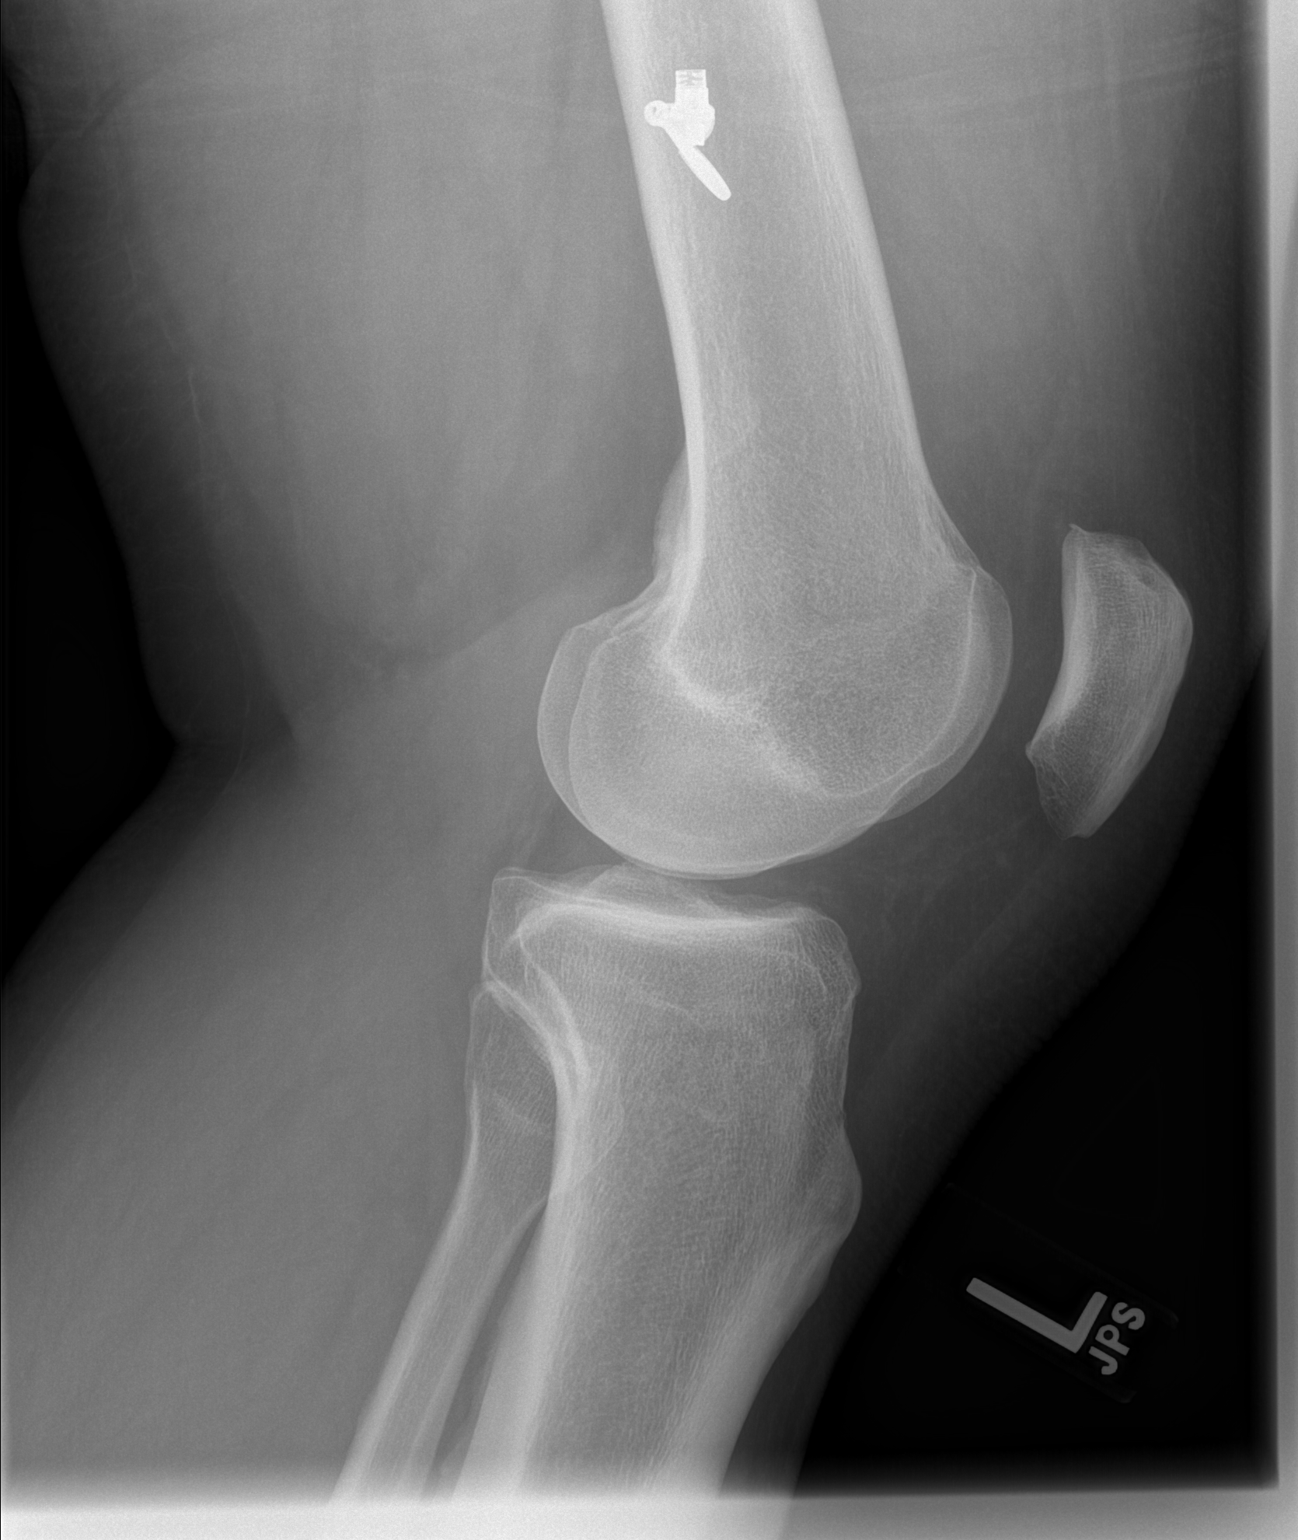

[2 of 2 positions shown; findings below may reference images not displayed]

FINDINGS: No joint effusion or fracture.  Mild patellofemoral
osteophytosis, as before.
IMPRESSION: Mild patellofemoral osteophytosis.

## 2010-08-15 ENCOUNTER — Encounter: Payer: Medicaid Other | Admitting: Internal Medicine

## 2010-08-16 ENCOUNTER — Other Ambulatory Visit: Payer: Medicaid Other

## 2010-08-19 ENCOUNTER — Encounter: Payer: Self-pay | Admitting: Internal Medicine

## 2010-08-21 ENCOUNTER — Other Ambulatory Visit (INDEPENDENT_AMBULATORY_CARE_PROVIDER_SITE_OTHER): Payer: Medicaid Other

## 2010-08-21 ENCOUNTER — Other Ambulatory Visit: Payer: Self-pay | Admitting: Infectious Diseases

## 2010-08-21 ENCOUNTER — Other Ambulatory Visit: Payer: Self-pay | Admitting: *Deleted

## 2010-08-21 ENCOUNTER — Other Ambulatory Visit: Payer: Self-pay | Admitting: Adult Health

## 2010-08-21 DIAGNOSIS — B20 Human immunodeficiency virus [HIV] disease: Secondary | ICD-10-CM

## 2010-08-21 DIAGNOSIS — I10 Essential (primary) hypertension: Secondary | ICD-10-CM

## 2010-08-21 MED ORDER — RITONAVIR 100 MG PO TABS: 100.0000 mg | ORAL_TABLET | Freq: Every day | ORAL | Status: DC

## 2010-08-21 MED ORDER — ZIDOVUDINE 300 MG PO TABS: 300.0000 mg | ORAL_TABLET | Freq: Two times a day (BID) | ORAL | Status: DC

## 2010-08-21 MED ORDER — METFORMIN HCL 1000 MG PO TABS: 1000.0000 mg | ORAL_TABLET | Freq: Two times a day (BID) | ORAL | Status: DC

## 2010-08-21 MED ORDER — ATAZANAVIR SULFATE 300 MG PO CAPS: 300.0000 mg | ORAL_CAPSULE | Freq: Every day | ORAL | Status: DC

## 2010-08-21 MED ORDER — ATENOLOL 50 MG PO TABS: 50.0000 mg | ORAL_TABLET | Freq: Every day | ORAL | Status: DC

## 2010-08-21 MED ORDER — DAPSONE 100 MG PO TABS: 100.0000 mg | ORAL_TABLET | Freq: Every day | ORAL | Status: DC

## 2010-08-21 MED ORDER — EMTRICITABINE-TENOFOVIR DF 200-300 MG PO TABS: 1.0000 | ORAL_TABLET | Freq: Every day | ORAL | Status: DC

## 2010-08-21 MED ORDER — LISINOPRIL 40 MG PO TABS: 40.0000 mg | ORAL_TABLET | Freq: Every day | ORAL | Status: DC

## 2010-08-22 LAB — COMPLETE METABOLIC PANEL WITH GFR
ALT: 13 U/L (ref 0–53)
AST: 16 U/L (ref 0–37)
Albumin: 4 g/dL (ref 3.5–5.2)
Alkaline Phosphatase: 56 U/L (ref 39–117)
BUN: 9 mg/dL (ref 6–23)
CO2: 24 mEq/L (ref 19–32)
Calcium: 9.2 mg/dL (ref 8.4–10.5)
Chloride: 98 mEq/L (ref 96–112)
Creat: 0.77 mg/dL (ref 0.40–1.50)
GFR, Est African American: >60 mL/min (ref >60)
GFR, Est Non African American: >60 mL/min (ref >60)
Glucose, Bld: 303 mg/dL — ABNORMAL HIGH (ref 70–99)
Potassium: 4 mEq/L (ref 3.5–5.3)
Sodium: 135 mEq/L (ref 135–145)
Total Bilirubin: 0.4 mg/dL (ref 0.3–1.2)
Total Protein: 7.2 g/dL (ref 6.0–8.3)

## 2010-08-22 LAB — HIV-1 RNA QUANT-NO REFLEX-BLD
HIV 1 RNA Quant: 33800 copies/mL — ABNORMAL HIGH (ref <20)
HIV-1 RNA Quant, Log: 4.53 {Log} — ABNORMAL HIGH (ref <1.30)

## 2010-08-22 LAB — CBC WITH DIFFERENTIAL/PLATELET
Basophils Absolute: 0 10*3/uL (ref 0.0–0.1)
Basophils Relative: 0 % (ref 0–1)
Eosinophils Absolute: 0 10*3/uL (ref 0.0–0.7)
Eosinophils Relative: 1 % (ref 0–5)
HCT: 39.8 % (ref 39.0–52.0)
Hemoglobin: 12.8 g/dL — ABNORMAL LOW (ref 13.0–17.0)
Lymphocytes Relative: 45 % (ref 12–46)
Lymphs Abs: 1.8 10*3/uL (ref 0.7–4.0)
MCH: 25.9 pg — ABNORMAL LOW (ref 26.0–34.0)
MCHC: 32.2 g/dL (ref 30.0–36.0)
MCV: 80.6 fL (ref 78.0–100.0)
Monocytes Absolute: 0.4 10*3/uL (ref 0.1–1.0)
Monocytes Relative: 11 % (ref 3–12)
Neutro Abs: 1.8 10*3/uL (ref 1.7–7.7)
Neutrophils Relative %: 44 % (ref 43–77)
Platelets: 198 10*3/uL (ref 150–400)
RBC: 4.94 MIL/uL (ref 4.22–5.81)
RDW: 13.9 % (ref 11.5–15.5)
WBC: 4 10*3/uL (ref 4.0–10.5)

## 2010-08-22 LAB — T-HELPER CELL (CD4) - (RCID CLINIC ONLY)
CD4 % Helper T Cell: 4 % — ABNORMAL LOW (ref 33–55)
CD4 T Cell Abs: 80 uL — ABNORMAL LOW (ref 400–2700)

## 2010-08-22 LAB — HEMOGLOBIN A1C: Hgb A1c MFr Bld: >20 % — ABNORMAL HIGH (ref <5.7)

## 2010-08-23 ENCOUNTER — Ambulatory Visit (INDEPENDENT_AMBULATORY_CARE_PROVIDER_SITE_OTHER): Payer: Medicaid Other | Admitting: Dietician

## 2010-08-23 ENCOUNTER — Telehealth: Payer: Self-pay | Admitting: *Deleted

## 2010-08-23 ENCOUNTER — Other Ambulatory Visit: Payer: Self-pay | Admitting: Dietician

## 2010-08-23 DIAGNOSIS — E119 Type 2 diabetes mellitus without complications: Secondary | ICD-10-CM

## 2010-08-23 MED ORDER — INSULIN GLARGINE 100 UNIT/ML ~~LOC~~ SOLN: 120.0000 [IU] | SUBCUTANEOUS | Status: DC

## 2010-08-23 NOTE — Telephone Encounter (Signed)
Called patient to advise that his HbgA1C is extremely high and per Dr. [name] he needed to call IM clinic and try to get an appointment to be seen today.  Patient was given phone number. Myrtis Hopping CMA

## 2010-08-23 NOTE — Telephone Encounter (Signed)
Patient will need new prescriptionl at new dose

## 2010-08-23 NOTE — Patient Instructions (Signed)
Increase your lantus to 120 units every morning.  Check blood sugar every morning before eating.  Please get lotion to put on heels daily after washing with soft cloth and drying thoroughly.

## 2010-08-23 NOTE — Progress Notes (Signed)
Patient seen today for diabetes out of control per [facility] clinic who got result of a1c yesterday of > 20. Patient says he wants to get in a assisted living situation or group home for 30 days to help him get things undercontrol. He says he has anger issues and broke his meter, so has not been self monitoirng, but has been taking his clear insulin - 90 units per day at 10 AM. I called [facility] to verify that he was indeed still taking lantus and had not changed to to Novolog Mix 70/30- this was confirmed by pharmacist. Patient last saw [facility] worker [name]-  6 months, has been working with [facility] person  Dorothy Spark he says told him only his doctor can refer to assisted living group home. Patient feels he needs " more than 90 units, more like 300 units a day to get to down." He says he took 250 units a day for a few days nad felt much better, with less dizziness.  Patient given meter in clinic today- accu-chek aviva- he demonstrated ability to use it CBG was 307 fasting per patient.  Per pharmacy patient picked up on 4/25 lantus solostar 2 boxes- patient indeed says he is almost out.  Discussed with attending physician who agrees to keep on current regimen and increase it by 30% to 120 units daily.  Plan is to  Discuss case with Dr. Mila Homer from [facility], social worker here and see if we need a team meeting to assist patient in caring for himself, his diabetes and needing ER and hospital less because of uncontrolled diabetes.

## 2010-08-24 ENCOUNTER — Ambulatory Visit: Payer: Medicaid Other | Admitting: Licensed Clinical Social Worker

## 2010-08-24 ENCOUNTER — Telehealth: Payer: Self-pay | Admitting: *Deleted

## 2010-08-24 ENCOUNTER — Telehealth: Payer: Self-pay | Admitting: Licensed Clinical Social Worker

## 2010-08-24 ENCOUNTER — Ambulatory Visit (INDEPENDENT_AMBULATORY_CARE_PROVIDER_SITE_OTHER): Payer: Medicaid Other | Admitting: Dietician

## 2010-08-24 DIAGNOSIS — E1165 Type 2 diabetes mellitus with hyperglycemia: Secondary | ICD-10-CM

## 2010-08-24 DIAGNOSIS — IMO0001 Reserved for inherently not codable concepts without codable children: Secondary | ICD-10-CM

## 2010-08-24 DIAGNOSIS — IMO0002 Reserved for concepts with insufficient information to code with codable children: Secondary | ICD-10-CM

## 2010-08-24 NOTE — Progress Notes (Signed)
CBG was 404 in office today  And fasting this am per patient. He brought his lantus insulin pen and demonstrated good technique with self injecting 60 units twice in his abdomen. Patient agreed to repeat 60 units twice each morning and to check his blood sugar for next week and made a follow up next Wednesday at 9 am.

## 2010-08-24 NOTE — Telephone Encounter (Signed)
Left message with Beulah Gandy at [facility] to see if we can coordinate care.

## 2010-08-24 NOTE — Telephone Encounter (Signed)
Keith [name] from [facility] called to speak with Social work and CDE about Keith [name]. She says that [facility] is no longer working with patient- they had worked with him for 3 years, then [facility] took over his care,  Got him his Medicaid and tried to get him into assisted liviing, but at that time he refused. His case has been closed by both [facility] and [facility] counseking.   [name] at [facility] asked jackie to give [place] her email address if we need additional information [email or web address]  [email or web address].

## 2010-08-24 NOTE — Telephone Encounter (Signed)
Spoke with Keith [name] at [facility] and Keith [name], patient is no longer a client of [facility].  Drake email.  She said Keith [name] is now willing to go to Assisted Living. Myrtis Hopping CMA

## 2010-08-27 NOTE — Progress Notes (Signed)
20 minutes.  Met with patient in [place] office.  Patient has uncontrolled diabetes and also HIV.   He apparently had Keith [name] and Keith [name] but those agencies have closed out their cases with Keith [name] according to ID clinic.   Keith [name] reports that he has Medicaid but not disability at this point.  He is working with an Forensic psychologist and has an upcoming hearing in July for his Disability.  He is inquiring about AL placement and I informed him that even though he has Medicaid without an income he will not be able to secure AL housing unless he is hospitalized and [facility] agrees to make that placement.  He is currently in subsidized housing while he awaits his Disability.   He reported that he used to work with Keith [name] of [facility] and it is unknown why he no longer works with adherence nurse.   Soc. Work will follow with patient and other agencies so we can make a new plan for him to address needs.  We will write letter of support for his disability claim and I will continue to partner with diabetes educator to address multiple and complex needs.

## 2010-08-29 ENCOUNTER — Ambulatory Visit: Payer: Medicaid Other | Admitting: Dietician

## 2010-08-30 ENCOUNTER — Encounter: Payer: Self-pay | Admitting: Licensed Clinical Social Worker

## 2010-08-30 ENCOUNTER — Ambulatory Visit: Payer: Medicaid Other | Admitting: Adult Health

## 2010-08-30 ENCOUNTER — Ambulatory Visit: Payer: Medicaid Other | Admitting: Licensed Clinical Social Worker

## 2010-08-30 ENCOUNTER — Telehealth: Payer: Self-pay | Admitting: Licensed Clinical Social Worker

## 2010-08-30 NOTE — Progress Notes (Signed)
F/U visit with Keith [name] for further planning.    He is looking for letter of support for his disability and I told him I was working on that and will call him when it is ready.   Phone is [phone].  I advised him that [facility] and nurse case mgmt have worked with him previously and it is uncertain whether they would be willing to work with him again because they had difficulties getting up with him.  He said that things are a lot different for him now and he is more stable and able to keep appointments. He said he has an appmt with ID and will ask them today if he can work with a nurse for medical case mgmt.    SW will write letter of support on patient behalf so he can bring to his attorney.

## 2010-08-30 NOTE — Telephone Encounter (Signed)
Spoke with nurse Keith [name] and Keith [name] who said multiple interventions have been tried with Keith [name] including Keith [name] and homecare provider nurse.  He has a new doctor-- Keith [name] who replaces Keith [name].   I told Keith [name] that I would call Keith [name] at [facility] to see if she would work with Keith [name] again to help manage his HIV and DM2.

## 2010-08-31 IMAGING — CR DG KNEE 1-2V*L*
2 series · 2 of 2 positions shown · non-contrast
Comparison: 09/22/2009

CLINICAL DATA: Chronic knee pain.

LEFT KNEE - 1-2 VIEW

[w knee ap left *]
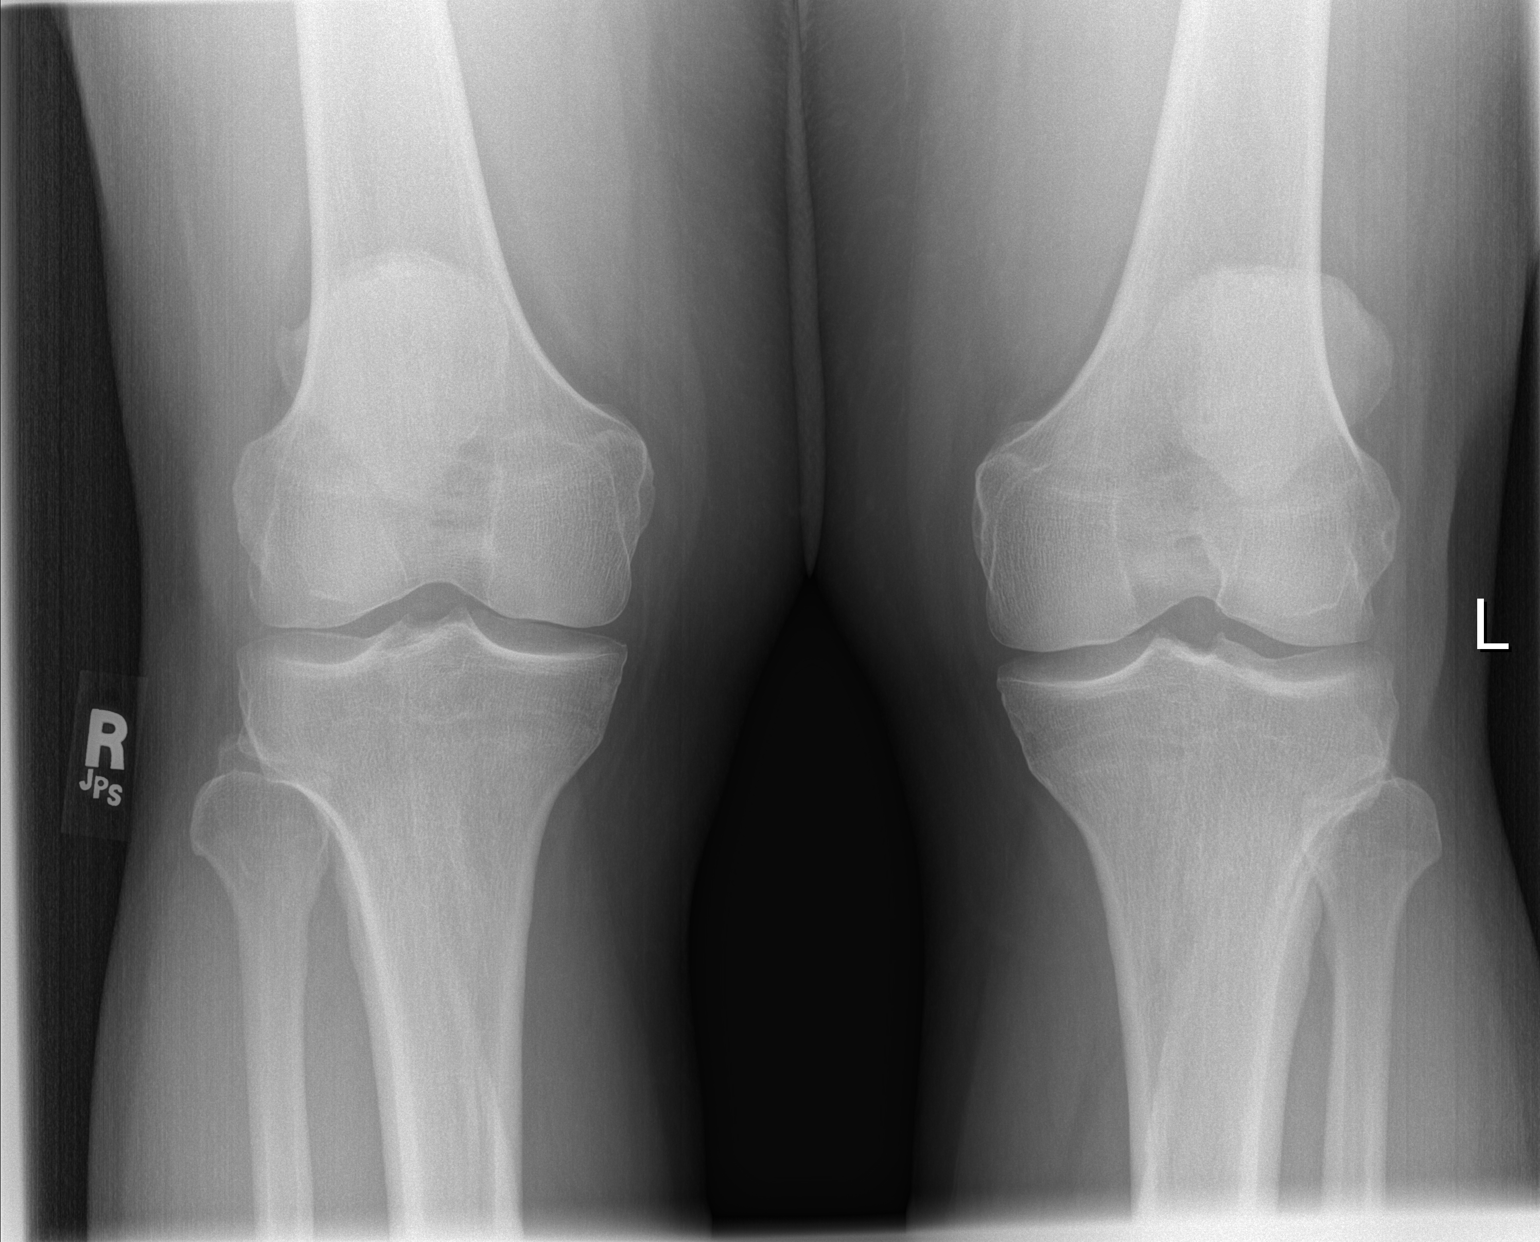

[t knee lat left]
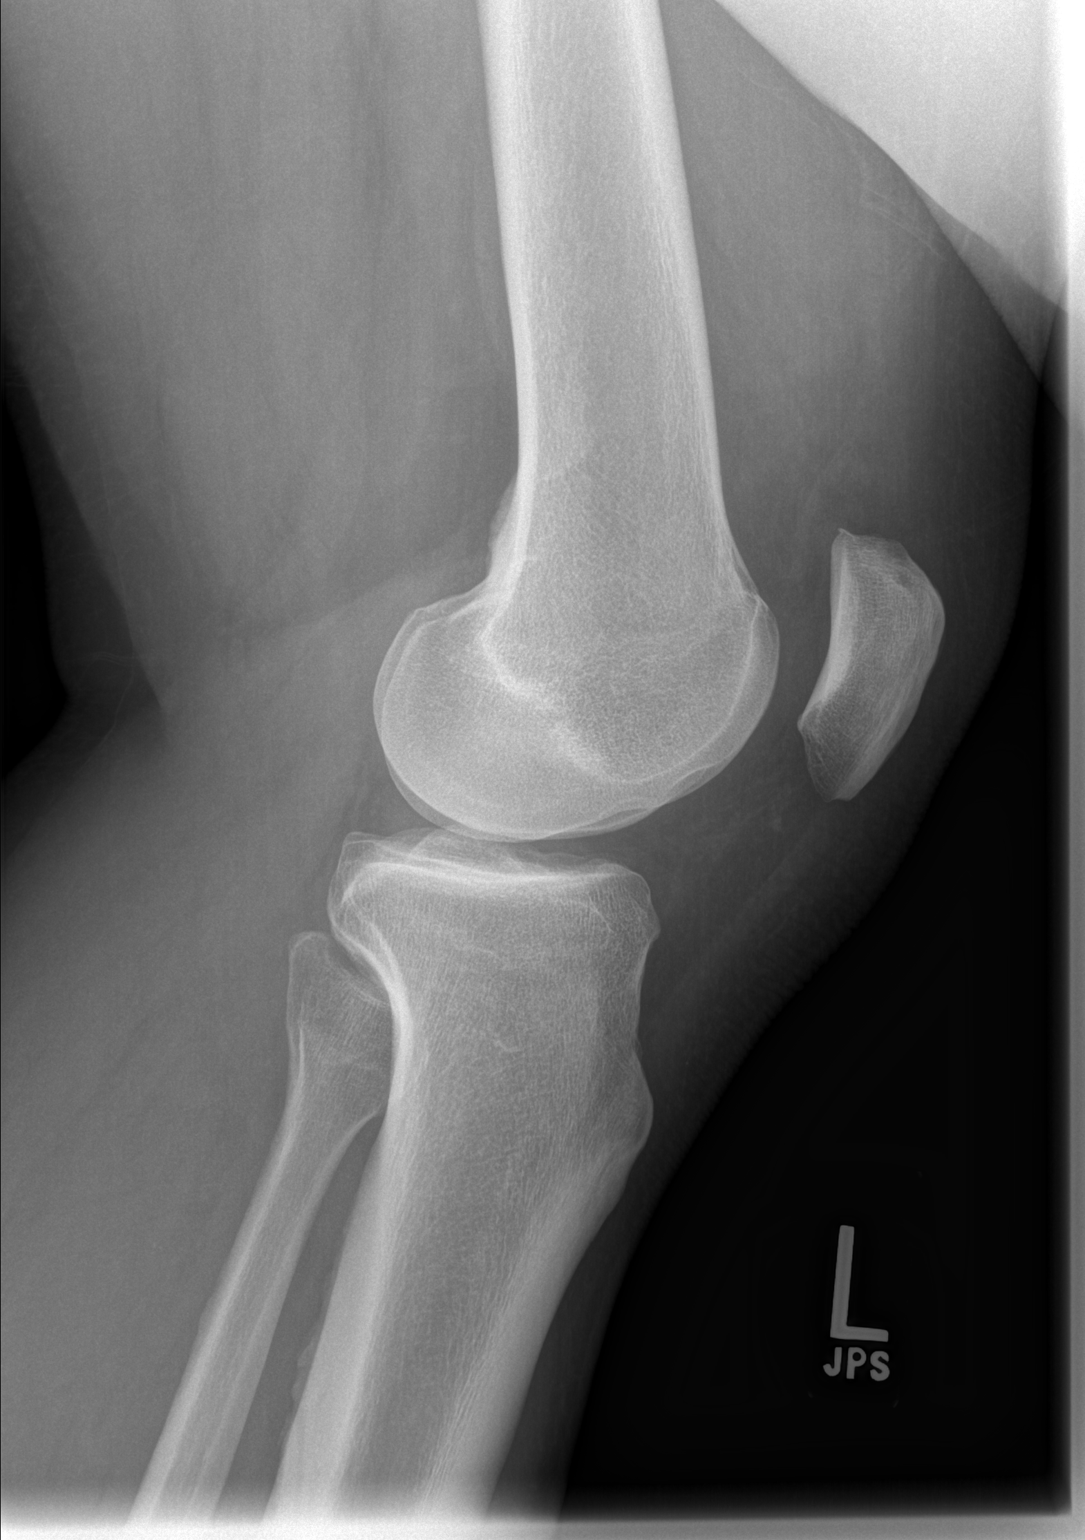

[2 of 2 positions shown; findings below may reference images not displayed]

FINDINGS: Weight bearing views of the left knee demonstrate mild
degenerative changes with early joint space narrowing and
osteophyte formation. No acute bony abnormality.  Specifically, no
fracture, subluxation, or dislocation.  Soft tissues are intact. No
joint effusion.
IMPRESSION: Early degenerative changes.  No acute findings.

## 2010-09-04 ENCOUNTER — Ambulatory Visit (INDEPENDENT_AMBULATORY_CARE_PROVIDER_SITE_OTHER): Payer: Medicaid Other | Admitting: Adult Health

## 2010-09-04 ENCOUNTER — Encounter: Payer: Self-pay | Admitting: Adult Health

## 2010-09-04 VITALS — BP 122/87 | HR 81 | Temp 98.1°F | Ht 69.0 in | Wt 283.0 lb

## 2010-09-04 DIAGNOSIS — B20 Human immunodeficiency virus [HIV] disease: Secondary | ICD-10-CM

## 2010-09-04 DIAGNOSIS — E119 Type 2 diabetes mellitus without complications: Secondary | ICD-10-CM

## 2010-09-04 DIAGNOSIS — M549 Dorsalgia, unspecified: Secondary | ICD-10-CM

## 2010-09-04 DIAGNOSIS — IMO0001 Reserved for inherently not codable concepts without codable children: Secondary | ICD-10-CM

## 2010-09-04 DIAGNOSIS — E1165 Type 2 diabetes mellitus with hyperglycemia: Secondary | ICD-10-CM

## 2010-09-04 DIAGNOSIS — IMO0002 Reserved for concepts with insufficient information to code with codable children: Secondary | ICD-10-CM

## 2010-09-04 DIAGNOSIS — M25569 Pain in unspecified knee: Secondary | ICD-10-CM

## 2010-09-04 DIAGNOSIS — G8929 Other chronic pain: Secondary | ICD-10-CM | POA: Insufficient documentation

## 2010-09-04 LAB — GLUCOSE, CAPILLARY: Glucose-Capillary: 256 mg/dL — ABNORMAL HIGH (ref 70–99)

## 2010-09-04 MED ORDER — OXYCODONE-ACETAMINOPHEN 7.5-325 MG PO TABS: 1.0000 | ORAL_TABLET | Freq: Four times a day (QID) | ORAL | Status: AC | PRN

## 2010-09-04 NOTE — Progress Notes (Signed)
  Subjective:    [name], male    DOB: February 16, 1966, 45 y.o.   MRN: [name]  HPI Presents for scheduled followup. He endorses a history of adherence to his medications except for a period of approximately. "2 weeks." As a result of problems with Medicaid. He also has been having problems with blood sugar control for which he is being followed by the internal medicine clinic. Also relates continued ongoing chronic right knee and lower back pain, which he has had for several years. Is requesting ongoing, chronic pain management.   Review of Systems  Constitutional: Positive for activity change, fatigue and unexpected weight change. Negative for fever and appetite change.  HENT: Negative.   Eyes: Negative for photophobia and visual disturbance.  Respiratory: Negative.   Cardiovascular: Negative.   Gastrointestinal: Negative.   Genitourinary: Negative.   Musculoskeletal: Positive for back pain, joint swelling and arthralgias.  Neurological: Negative.   Hematological: Negative.   Psychiatric/Behavioral: Negative.        Objective:   Physical Exam  Constitutional: He is oriented to person, place, and time. He appears well-developed. No distress.       Morbidly obese  HENT:  Head: Normocephalic and atraumatic.  Right Ear: External ear normal.  Left Ear: External ear normal.  Mouth/Throat: Oropharynx is clear and moist.  Eyes: Conjunctivae and EOM are normal. Pupils are equal, round, and reactive to light.  Neck: Normal range of motion. Neck supple.  Cardiovascular: Normal rate, regular rhythm, normal heart sounds and intact distal pulses.   Pulmonary/Chest: Effort normal and breath sounds normal.  Abdominal: Bowel sounds are normal.  Musculoskeletal:       Limited flexion and extension to the right knee secondary to pain.  Neurological: He is alert and oriented to person, place, and time. No cranial nerve deficit. He exhibits normal muscle tone. Coordination normal.    Skin: Skin is warm and dry.  Psychiatric: He has a normal mood and affect. His behavior is normal. Judgment and thought content normal.          Assessment & Plan:   1. HIV. Labs obtained 08/21/2010 show a CD4 count of 80 at 4% with a viral load of 33,800 copies per mL. In spite of his endorsement that he is adherent to his regimen, his labs portend a failure in his treatment regimen. We will forward a genotype on the blood obtained on 08/21/2010, and ask that he return in 2 weeks to discuss possible changes in his regimen. Meantime, discussion of the impact of adherence on resistance and treatment outcomes was made with him. Initially, he did not comprehend this cause and effect relationship, but began to acknowledge his understanding of this.  2. Diabetes. Hemoglobin A1c was obtained on 08/21/2010, which was documented as greater than 20%. This information was conveyed to the internal medicine clinic and he is scheduled for followup with Barnabas Harries tomorrow. His CBG this morning was 296. Adherence to his diabetic care was also discussed with emphasis on the negative consequences of poor adherence.  3. Chronic Knee and Back Pain. As this is been long-term in nature, we will refer him to pain management clinic. We will give him oxycodone 7.5/325 one by mouth every 6 hours when necessary until he has been evaluated by the pain clinic. Pain contract was provided, and signed by him.  Verbally acknowledged all information that was provided, and agreed with plan of care

## 2010-09-05 ENCOUNTER — Ambulatory Visit: Payer: Medicaid Other | Admitting: Dietician

## 2010-09-05 ENCOUNTER — Ambulatory Visit: Payer: Medicaid Other | Admitting: Licensed Clinical Social Worker

## 2010-09-05 ENCOUNTER — Ambulatory Visit (INDEPENDENT_AMBULATORY_CARE_PROVIDER_SITE_OTHER): Payer: Medicaid Other | Admitting: Dietician

## 2010-09-05 DIAGNOSIS — IMO0001 Reserved for inherently not codable concepts without codable children: Secondary | ICD-10-CM

## 2010-09-05 DIAGNOSIS — E1165 Type 2 diabetes mellitus with hyperglycemia: Secondary | ICD-10-CM

## 2010-09-05 DIAGNOSIS — IMO0002 Reserved for concepts with insufficient information to code with codable children: Secondary | ICD-10-CM

## 2010-09-06 LAB — HIV-1 RNA ULTRAQUANT REFLEX TO GENTYP+
HIV 1 RNA Quant: 36400 copies/mL — ABNORMAL HIGH (ref <20)
HIV-1 RNA Quant, Log: 4.56 {Log} — ABNORMAL HIGH (ref <1.30)

## 2010-09-06 IMAGING — CR DG KNEE COMPLETE 4+V*L*
4 series · 4 of 4 positions shown · non-contrast
Comparison: Left knee radiographs performed 10/04/2009

CLINICAL DATA: Left medial knee pain, status post fall.

LEFT KNEE - COMPLETE 4+ VIEW

[t knee ap left]
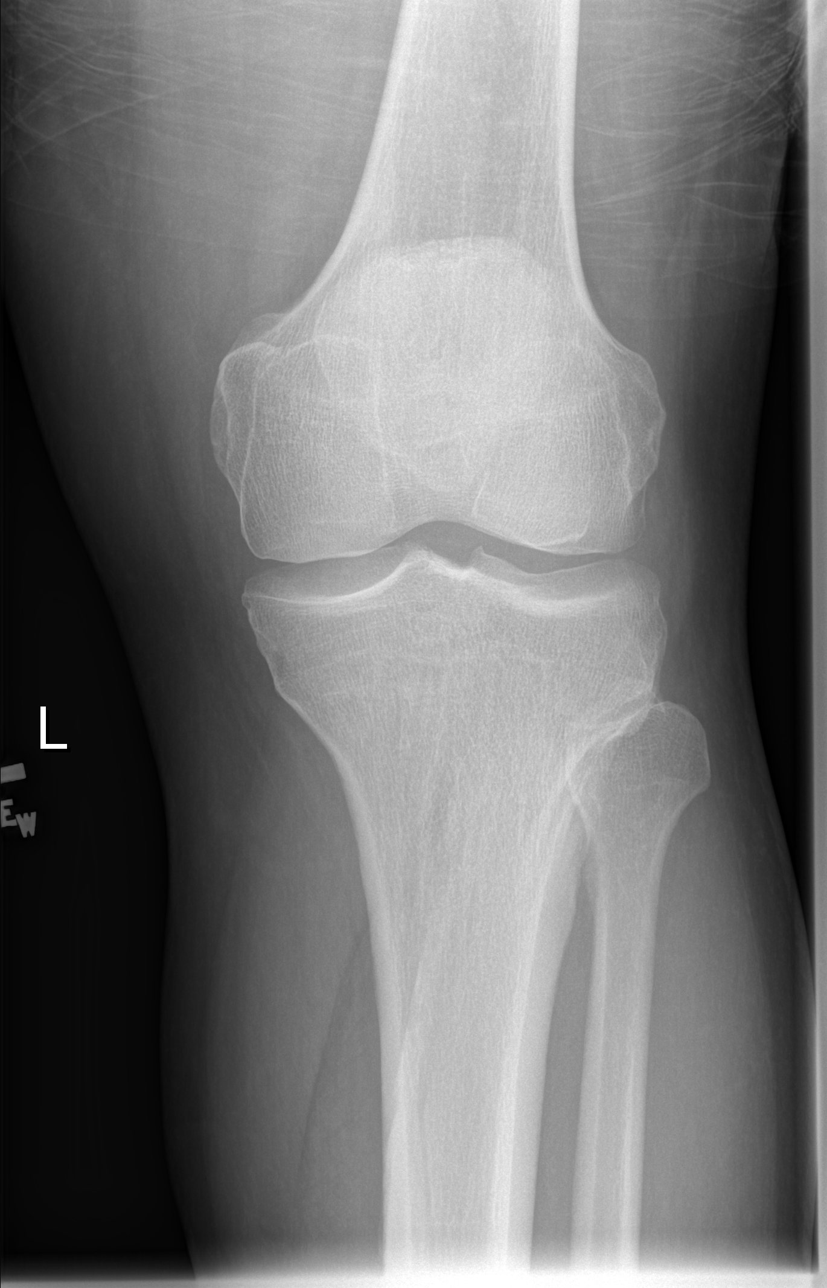

[t knee oblique left *]
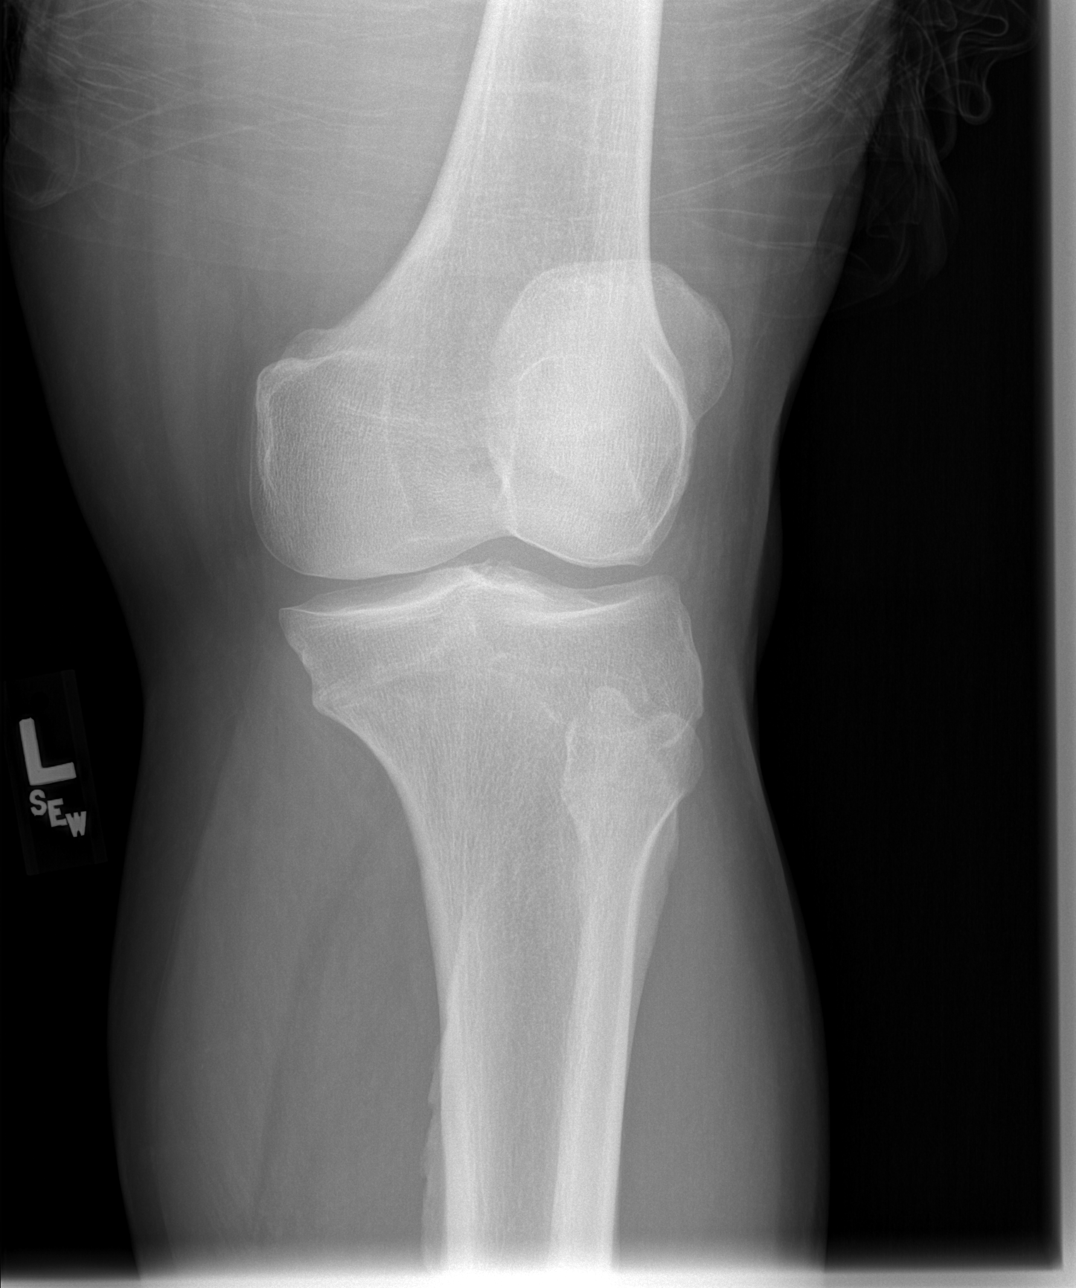

[t knee oblique left]
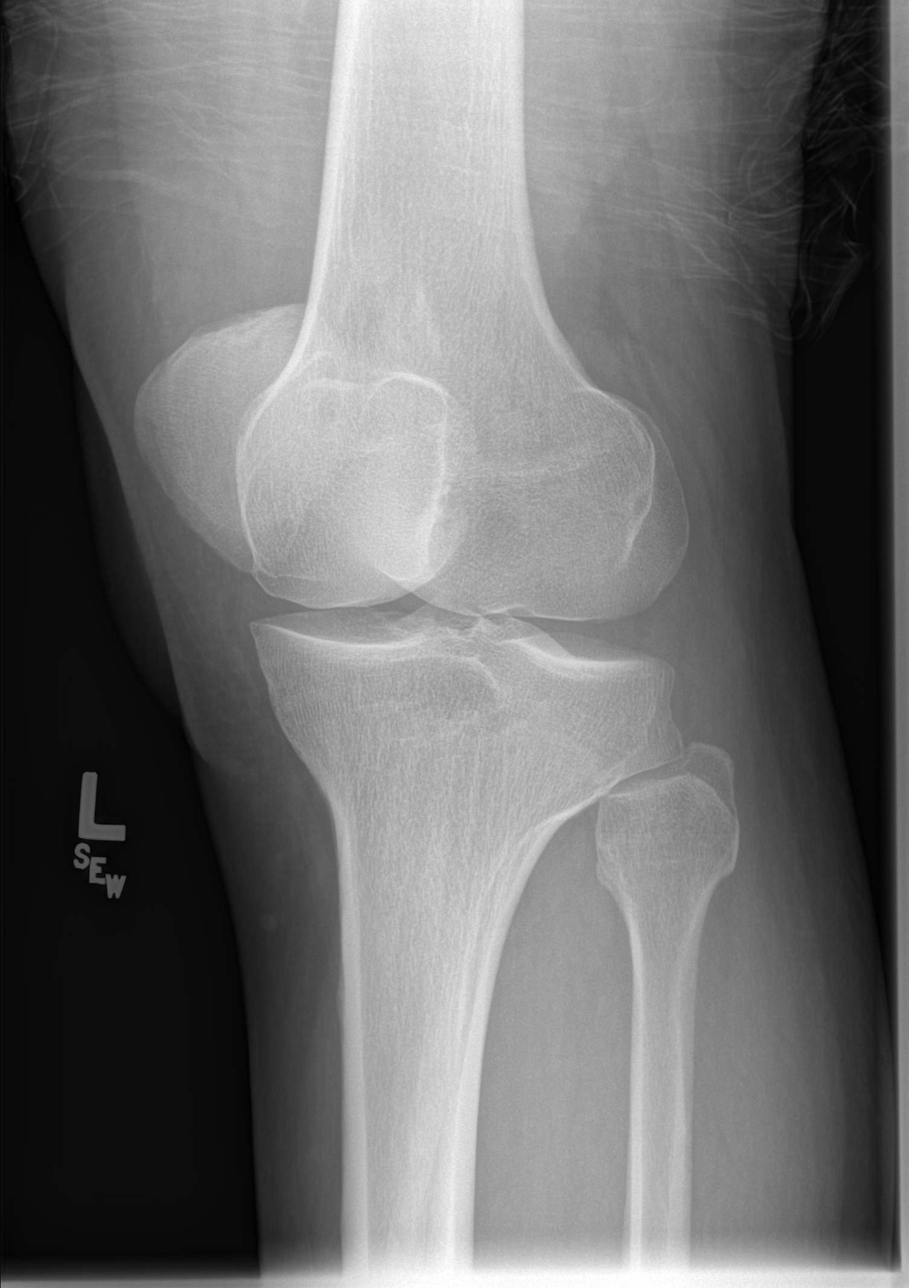

[t knee lat left *]
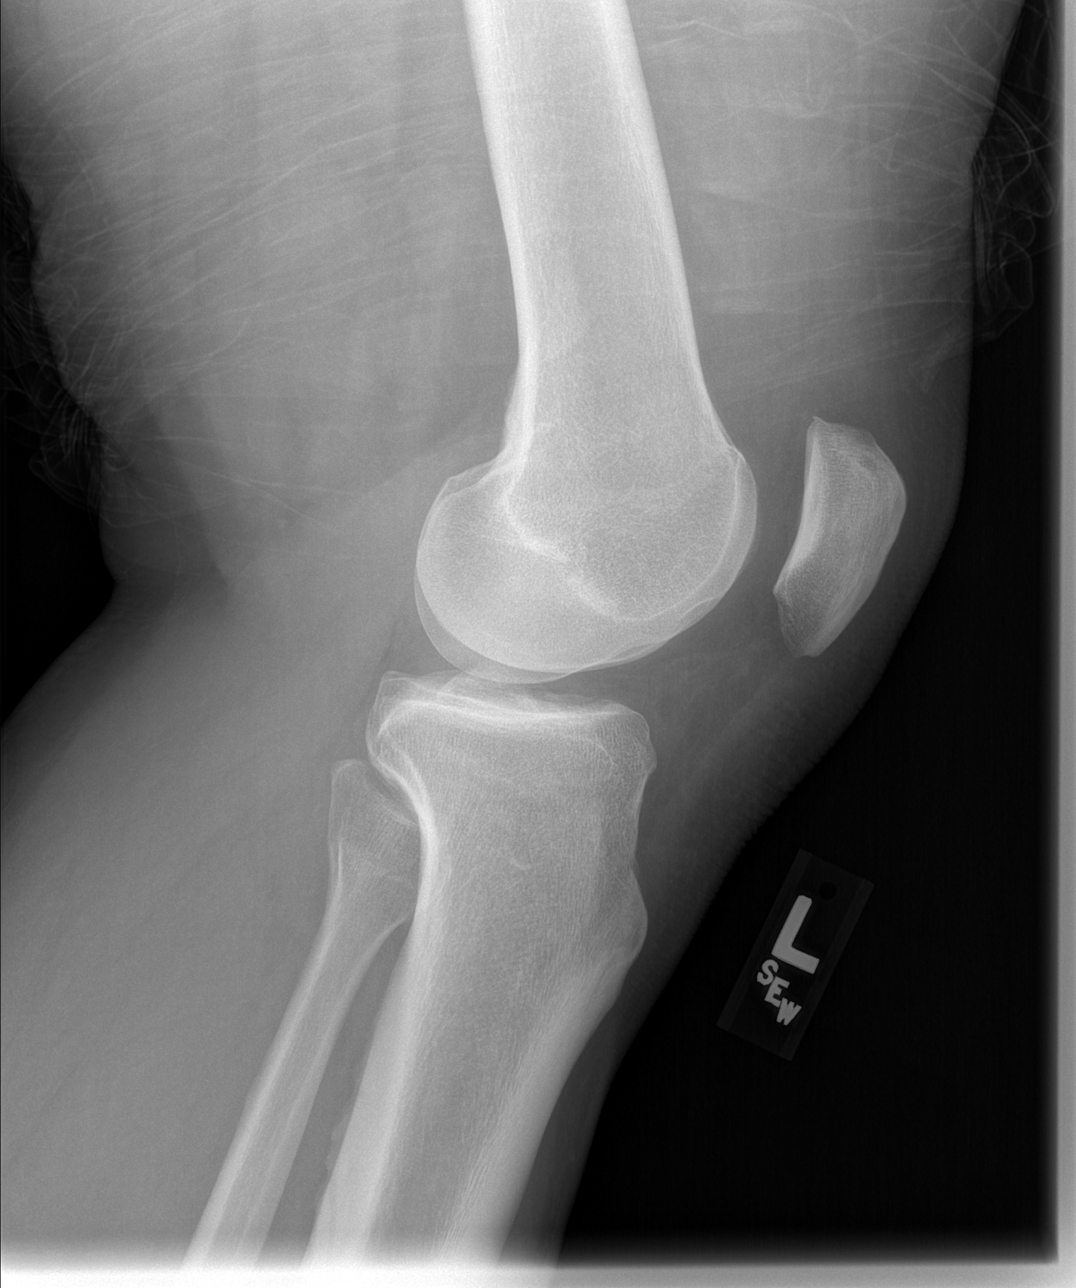

[4 of 4 positions shown; findings below may reference images not displayed]

FINDINGS: There is no evidence of fracture or dislocation.  The
joint spaces are preserved.  Very mild degenerative change is
noted, with squaring of the medial and lateral compartments; the
patellofemoral joint is grossly unremarkable in appearance.

No significant joint effusion is seen.  The visualized soft tissues
are normal in appearance.
IMPRESSION: No evidence of fracture or dislocation.

## 2010-09-06 NOTE — Progress Notes (Signed)
Met with patient along with social worker, Katy Fitch.  Blood sugar 303 after breakfast meal at visit today. Patient did not bring his meter today. Reports blood sugars better,b ut still in 200-300 range. Patient reports taking his 120 units daily of insulin daily in 2 60 unit injections despite not liking them and that it is "hard" for him to take them. He asks if he'll have to be on  Insulin forever. Requested more information about how HIV is affecting his diabetes control. Discussed risk associated with high A1C in terms of chronic complications of diabetes.  Requested patient being meter by office in next few days to have it downloaded for doctor to see how new insulin dose is affecting blood sugars.

## 2010-09-06 NOTE — Progress Notes (Signed)
Soc. Work.  Met with patient in [place] office.  Patient trying to get diabetes under control and CDE advised that he is making progress and sugars were down.  Patient reported that he was compliant with insulin but it was "hard"  Patient still inquiring about assisted living and so he was given a letter of support for his disability to give to his attny so that we can secure perm. Medicaid and income to possibly facilitate AL placement.  Since CDE and patient seem to be making progress on the diabetes front, we are hopeful he will also make progress re: 042 and remain independently at home.  Continued f/u.  Please note letter of support for Disability and that CM from [place] reports low IQ (mild to moderate range) and that patient completed 10th grade and was assigned special classes.

## 2010-09-13 LAB — HIV-1 GENOTYPR PLUS

## 2010-09-17 ENCOUNTER — Telehealth: Payer: Self-pay | Admitting: *Deleted

## 2010-09-17 NOTE — Telephone Encounter (Signed)
States he cannot take tylenol or ibuprofen and it hurting badly. Wants percocet called in now. Told him provider did not enter any refills & was not here to approve more. He asked that I check with others. Told him I will & will call him with response. [phone]

## 2010-09-18 ENCOUNTER — Ambulatory Visit: Payer: Medicaid Other | Admitting: Adult Health

## 2010-09-18 NOTE — Telephone Encounter (Signed)
He got the pain med on 09/04/10. Told him it is too soon for a refill. He was to see a pain clinician. Keith [name] is working on the referral.

## 2010-09-19 ENCOUNTER — Encounter: Payer: Self-pay | Admitting: Internal Medicine

## 2010-09-19 ENCOUNTER — Ambulatory Visit (INDEPENDENT_AMBULATORY_CARE_PROVIDER_SITE_OTHER): Payer: Medicaid Other | Admitting: Internal Medicine

## 2010-09-19 DIAGNOSIS — M549 Dorsalgia, unspecified: Secondary | ICD-10-CM

## 2010-09-19 DIAGNOSIS — B20 Human immunodeficiency virus [HIV] disease: Secondary | ICD-10-CM

## 2010-09-19 DIAGNOSIS — G8929 Other chronic pain: Secondary | ICD-10-CM

## 2010-09-19 NOTE — Assessment & Plan Note (Addendum)
HIV- has been on meds a few weeks, will check labs today.  Had been out previously.  On dapsone for prophylaxis.  Patient does not know what meds he is on and I am concerned that he is not forthcoming with his compliance.  Will need to follow closely.

## 2010-09-19 NOTE — Progress Notes (Signed)
  Subjective:    [name], male    DOB: 1965-10-20, 45 y.o.   MRN: [name]  HPI presents with complaint of back and knee pain.  States it is ever present and nothing helps except oxycodone.  Wants a refill of that.  Has been to PT in past one time and stated it "hurt".   Has not been to pain clinic and wants to go, did not yet get appt.  Does not want more PT nor anti inflammatories.     States he also has restarted his ARV regimen 3 weeks ago.  Was previously off for at least several weeks.  Recent genotype wild type.      Review of Systems  Constitutional: Negative.   HENT: Negative.   Eyes: Negative.   Respiratory: Negative.  Negative for apnea.   Cardiovascular: Negative.   Gastrointestinal: Negative.   Genitourinary: Negative.   Musculoskeletal: Positive for back pain and arthralgias.  Skin: Negative.   Neurological: Negative.   Hematological: Negative.   Psychiatric/Behavioral: Negative.        Objective:   Physical Exam  Constitutional: He appears well-developed and well-nourished. No distress.  HENT:  Mouth/Throat: No oropharyngeal exudate.  Eyes: No scleral icterus.  Cardiovascular: Normal rate, regular rhythm and normal heart sounds.   No murmur heard. Pulmonary/Chest: Effort normal and breath sounds normal. No respiratory distress. He has no wheezes.  Abdominal: Soft. Bowel sounds are normal. He exhibits no distension. There is no tenderness.       Morbidly obese  Musculoskeletal: Normal range of motion.       Arms: Lymphadenopathy:    He has no cervical adenopathy.  Skin: Skin is warm and dry. No erythema.          Assessment & Plan:

## 2010-09-19 NOTE — Assessment & Plan Note (Addendum)
This is not new, I encouraged patient to return to PT and weight loss.  I also encouraged the use of antiinflammatories as a better treatment.  Patient though only wants more Percocets.  I explained that it is necessary to find out the problem and deal with it, narcotic meds are only a temporary solution.  Patient was prescribed 50 tabs of 7.5 recently and has already used them all.  I am concerned with him taking too many and have again referred him to pain management and explained I will not prescribe him any today.  Patient refuses PT or anything else.

## 2010-09-20 IMAGING — CR DG KNEE COMPLETE 4+V*L*
4 series · 4 of 4 positions shown · non-contrast
Comparison: 10/10/2009

CLINICAL DATA: Knee injury and pain.

LEFT KNEE - COMPLETE 4+ VIEW

[t knee ap left]
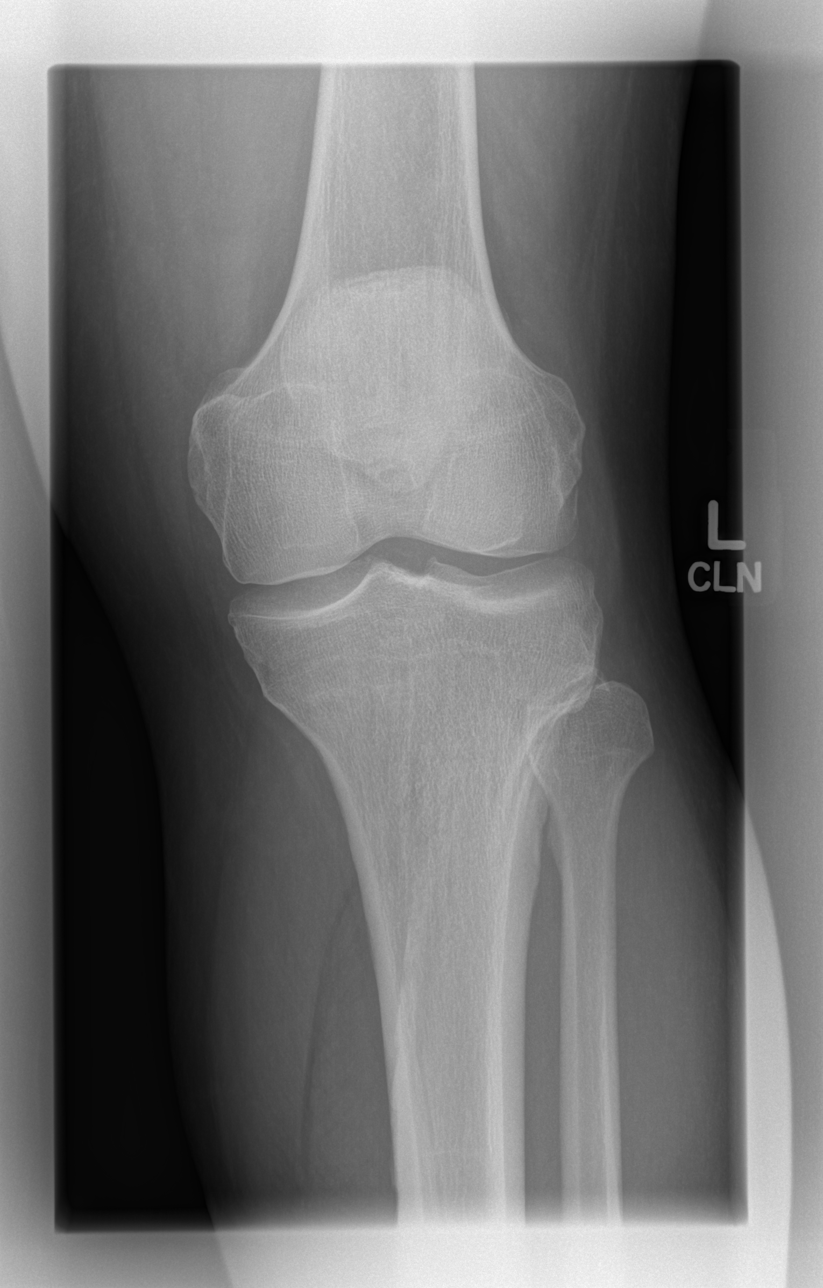

[t knee oblique left (1 of 2)]
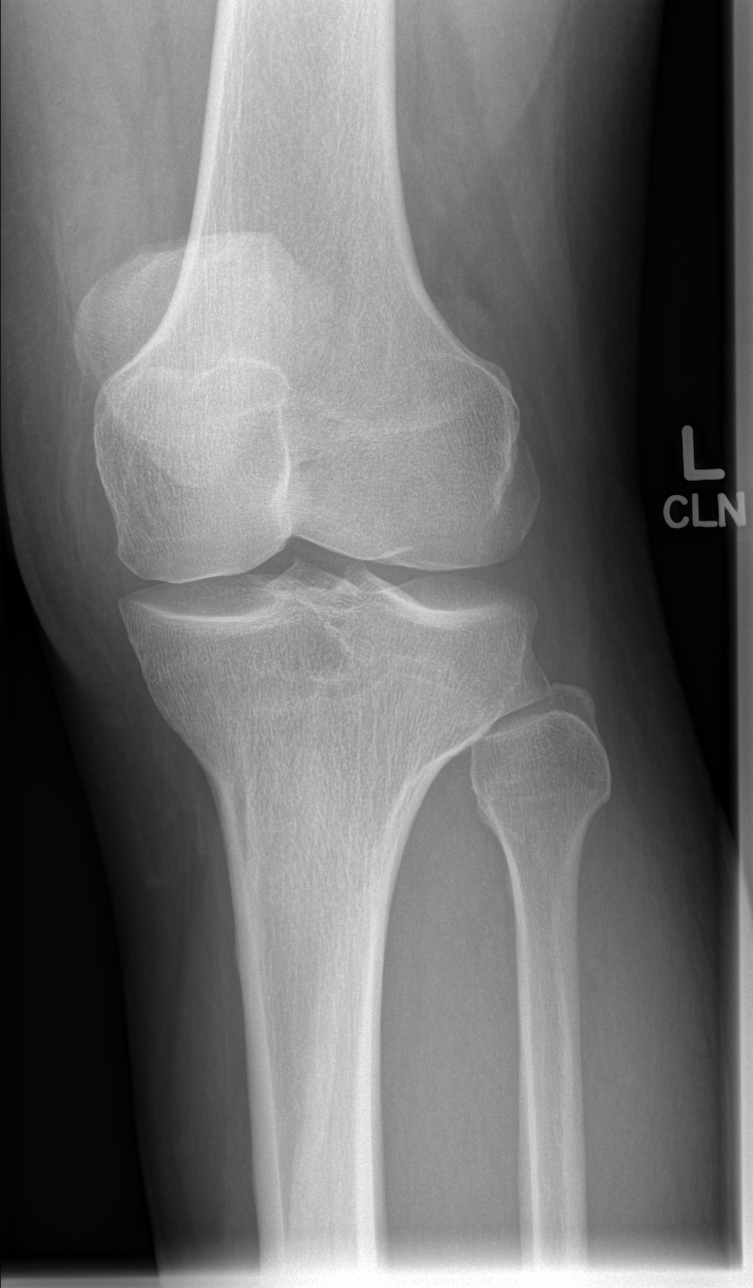

[t knee oblique left (2 of 2)]
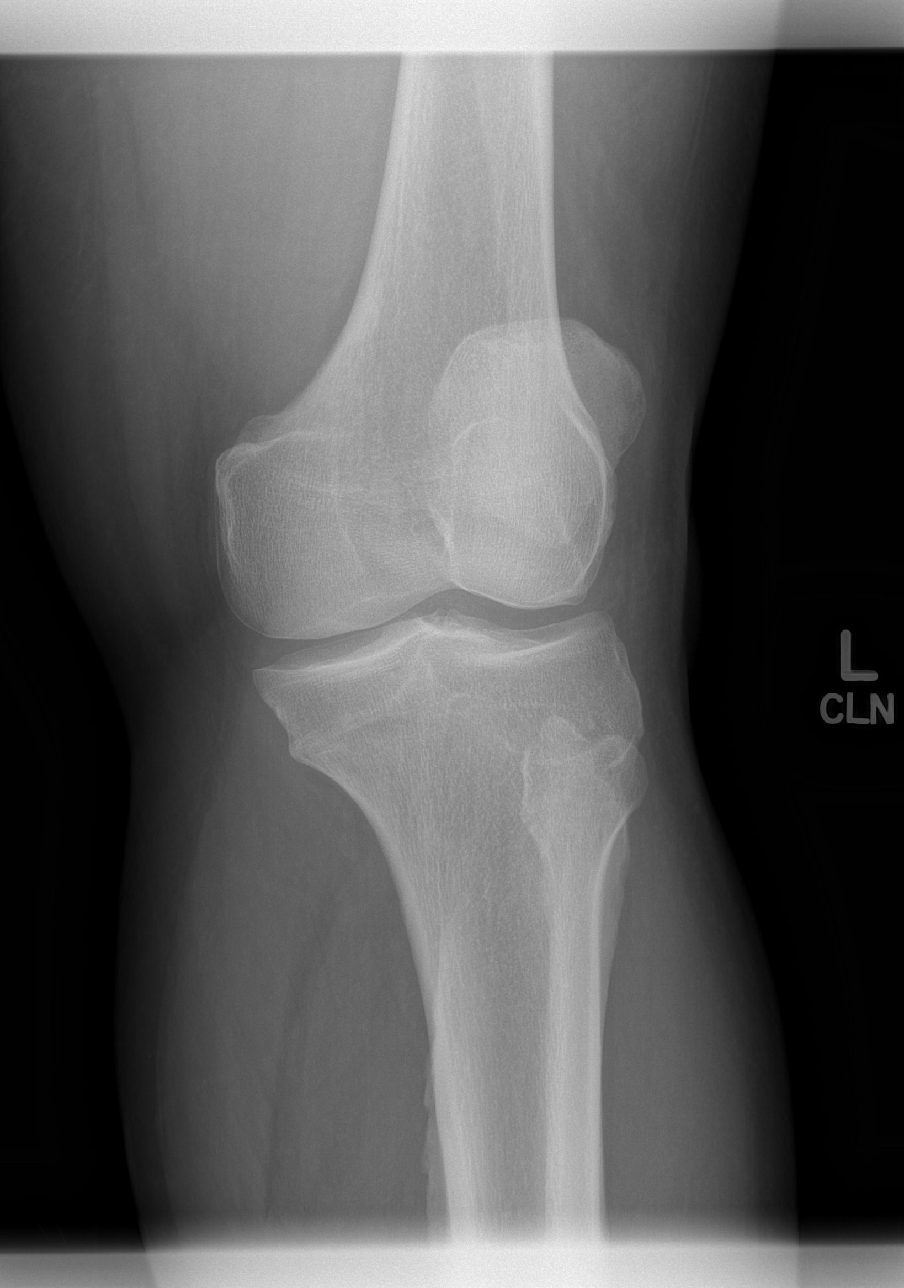

[t knee lat left]
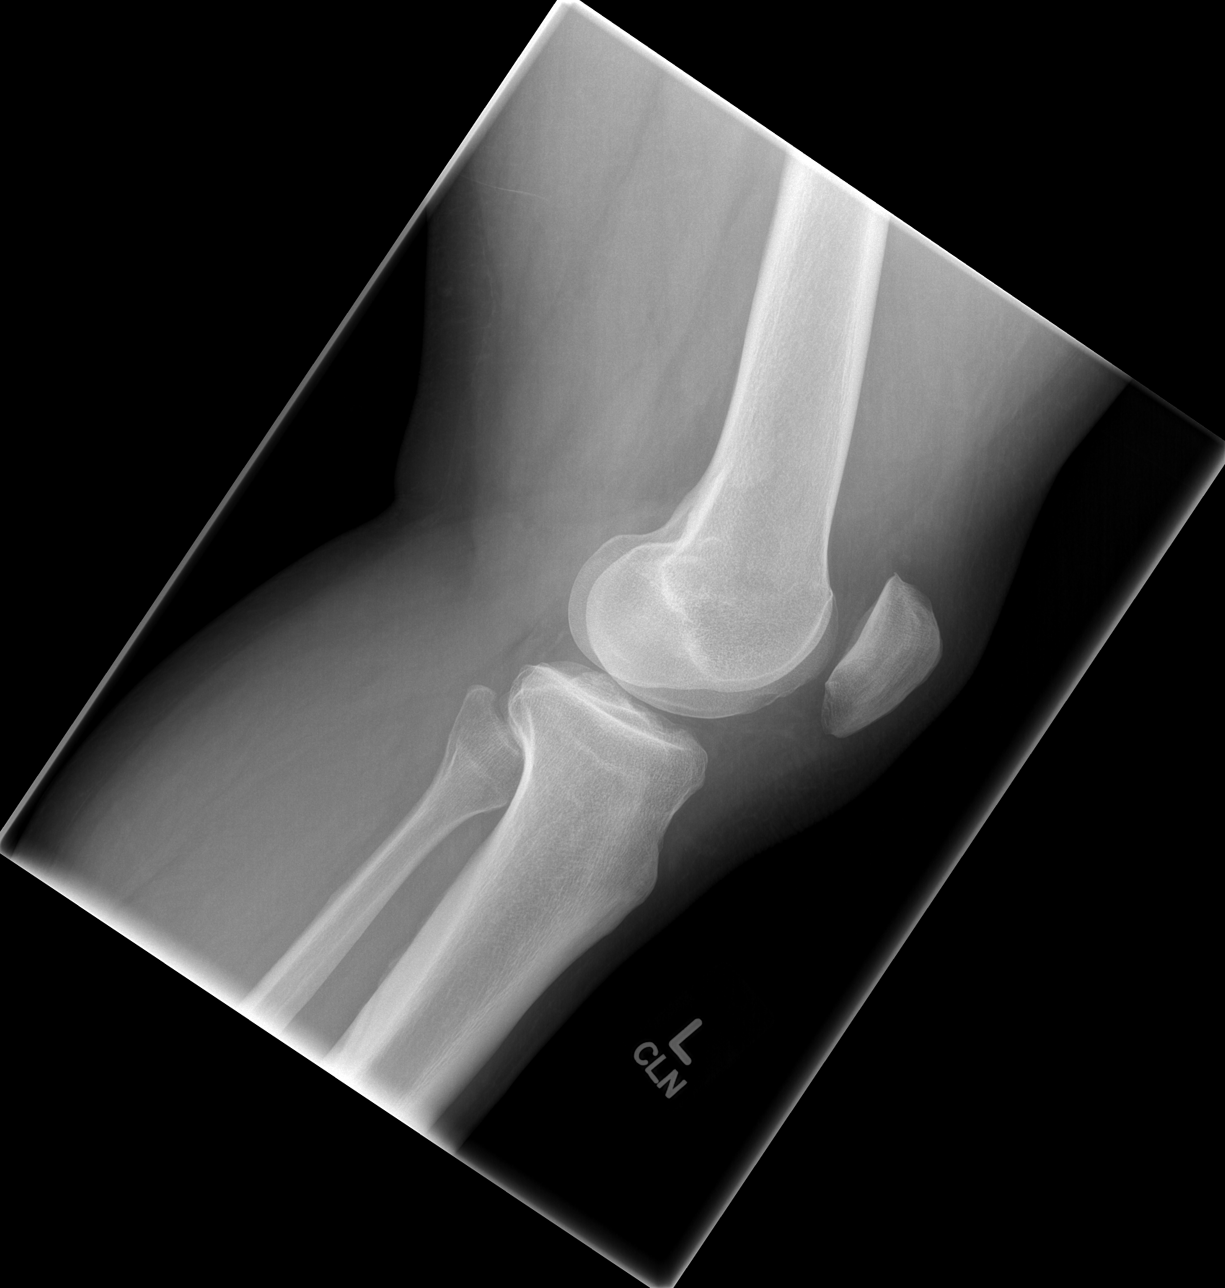

[4 of 4 positions shown; findings below may reference images not displayed]

FINDINGS: There is no evidence of fracture, dislocation, or joint
effusion.  There is no evidence of arthropathy or other focal bone
abnormality.  Soft tissues are unremarkable.
IMPRESSION: Negative.

## 2010-10-01 ENCOUNTER — Emergency Department (HOSPITAL_COMMUNITY)
Admission: EM | Admit: 2010-10-01 | Discharge: 2010-10-01 | Disposition: A | Discharge status: Home / Self Care | Payer: Medicaid Other | Attending: Emergency Medicine | Admitting: Emergency Medicine

## 2010-10-01 ENCOUNTER — Telehealth: Payer: Self-pay | Admitting: *Deleted

## 2010-10-01 DIAGNOSIS — Z794 Long term (current) use of insulin: Secondary | ICD-10-CM | POA: Insufficient documentation

## 2010-10-01 DIAGNOSIS — M51379 Other intervertebral disc degeneration, lumbosacral region without mention of lumbar back pain or lower extremity pain: Secondary | ICD-10-CM | POA: Insufficient documentation

## 2010-10-01 DIAGNOSIS — Z79899 Other long term (current) drug therapy: Secondary | ICD-10-CM | POA: Insufficient documentation

## 2010-10-01 DIAGNOSIS — Z862 Personal history of diseases of the blood and blood-forming organs and certain disorders involving the immune mechanism: Secondary | ICD-10-CM | POA: Insufficient documentation

## 2010-10-01 DIAGNOSIS — E119 Type 2 diabetes mellitus without complications: Secondary | ICD-10-CM | POA: Insufficient documentation

## 2010-10-01 DIAGNOSIS — M25569 Pain in unspecified knee: Secondary | ICD-10-CM | POA: Insufficient documentation

## 2010-10-01 DIAGNOSIS — M5137 Other intervertebral disc degeneration, lumbosacral region: Secondary | ICD-10-CM | POA: Insufficient documentation

## 2010-10-01 DIAGNOSIS — M545 Low back pain, unspecified: Secondary | ICD-10-CM | POA: Insufficient documentation

## 2010-10-01 DIAGNOSIS — Z8639 Personal history of other endocrine, nutritional and metabolic disease: Secondary | ICD-10-CM | POA: Insufficient documentation

## 2010-10-01 DIAGNOSIS — I10 Essential (primary) hypertension: Secondary | ICD-10-CM | POA: Insufficient documentation

## 2010-10-01 DIAGNOSIS — M1710 Unilateral primary osteoarthritis, unspecified knee: Secondary | ICD-10-CM | POA: Insufficient documentation

## 2010-10-01 DIAGNOSIS — G8929 Other chronic pain: Secondary | ICD-10-CM | POA: Insufficient documentation

## 2010-10-01 DIAGNOSIS — Z21 Asymptomatic human immunodeficiency virus [HIV] infection status: Secondary | ICD-10-CM | POA: Insufficient documentation

## 2010-10-01 NOTE — Telephone Encounter (Signed)
C/o severe r knee pain. Had been taking percocet 7.5 but is out. He has tried IM, but they do not have any appts. We do not either. He has an appt here Wednesday. States he is going to the ED

## 2010-10-03 ENCOUNTER — Ambulatory Visit (INDEPENDENT_AMBULATORY_CARE_PROVIDER_SITE_OTHER): Payer: Medicaid Other | Admitting: Adult Health

## 2010-10-03 ENCOUNTER — Ambulatory Visit: Payer: Medicaid Other | Admitting: Internal Medicine

## 2010-10-03 ENCOUNTER — Encounter: Payer: Self-pay | Admitting: Adult Health

## 2010-10-03 DIAGNOSIS — M549 Dorsalgia, unspecified: Secondary | ICD-10-CM

## 2010-10-03 DIAGNOSIS — M25569 Pain in unspecified knee: Secondary | ICD-10-CM

## 2010-10-03 DIAGNOSIS — B20 Human immunodeficiency virus [HIV] disease: Secondary | ICD-10-CM

## 2010-10-03 DIAGNOSIS — G8929 Other chronic pain: Secondary | ICD-10-CM

## 2010-10-03 LAB — CBC WITH DIFFERENTIAL/PLATELET
Basophils Absolute: 0 10*3/uL (ref 0.0–0.1)
Basophils Relative: 0 % (ref 0–1)
Eosinophils Absolute: 0 10*3/uL (ref 0.0–0.7)
Eosinophils Relative: 1 % (ref 0–5)
HCT: 43.2 % (ref 39.0–52.0)
Hemoglobin: 13.9 g/dL (ref 13.0–17.0)
Lymphocytes Relative: 42 % (ref 12–46)
Lymphs Abs: 1.9 10*3/uL (ref 0.7–4.0)
MCH: 25.8 pg — ABNORMAL LOW (ref 26.0–34.0)
MCHC: 32.2 g/dL (ref 30.0–36.0)
MCV: 80.1 fL (ref 78.0–100.0)
Monocytes Absolute: 0.3 10*3/uL (ref 0.1–1.0)
Monocytes Relative: 7 % (ref 3–12)
Neutro Abs: 2.3 10*3/uL (ref 1.7–7.7)
Neutrophils Relative %: 50 % (ref 43–77)
Platelets: 230 10*3/uL (ref 150–400)
RBC: 5.39 MIL/uL (ref 4.22–5.81)
RDW: 13.5 % (ref 11.5–15.5)
WBC: 4.5 10*3/uL (ref 4.0–10.5)

## 2010-10-03 MED ORDER — HYDROCODONE-IBUPROFEN 7.5-200 MG PO TABS: 1.0000 | ORAL_TABLET | Freq: Three times a day (TID) | ORAL | Status: AC | PRN

## 2010-10-03 NOTE — Progress Notes (Signed)
  Subjective:    [name], male    DOB: Jul 19, 1965, 45 y.o.   MRN: [name]  HPI Mr. Paille returns to clinic today with with the same complaints as were presented on 09/24/2010, when he was seen by Dr.Comer. He was requesting pain medication and nothing more. There was a consult made previously to the pain clinic for which he states he had never been contacted by them to schedule an evaluation. Pain in his lower back and his right knee continue to persist, but he does not feel, PT, or nonsteroidals have provided him with any relief. He denies, following up with internal medicine regarding this complaint. He states he has been adherent to his HIV medications, but has not had labs drawn recently.   Review of Systems  Genitourinary: Positive for frequency.  Musculoskeletal: Positive for back pain and arthralgias.       Objective:   Physical Exam  Constitutional: He is oriented to person, place, and time. He appears well-developed.       Morbidly obese  HENT:  Head: Normocephalic and atraumatic.  Eyes: Conjunctivae and EOM are normal. Pupils are equal, round, and reactive to light.  Neck: Normal range of motion. Neck supple.  Cardiovascular: Normal rate and regular rhythm.   Pulmonary/Chest: Effort normal and breath sounds normal.  Abdominal: Soft. Bowel sounds are normal.  Musculoskeletal:       Ambulates with a guarded gait and limp, favoring the right lower extremity  Neurological: He is alert and oriented to person, place, and time. No cranial nerve deficit. He exhibits normal muscle tone. Coordination normal.  Skin: Skin is warm and dry.  Psychiatric: He has a normal mood and affect. His behavior is normal. Judgment and thought content normal.          Assessment & Plan:  1. Chronic pain to lower back, and right knee. After reviewing his last visit with Dr.Comer on 09/24/2010 the same concerns. Dr. De Burrs had are relevant with this visit, as well. As he states he  has not received any information from the pain clinic, we will contact the clinic at this time, and ask for followup with respect to the consult. We will give him 30 tablets of hydrocodone with ibuprofen as a one-time fill until he can give him to be seen by the pain clinic. He was informed that no further pain medications. Will be prescribed by this provider for him. He verbally acknowledged this information and agreed. A message was left with program administrator for the pain clinic.

## 2010-10-03 NOTE — Patient Instructions (Signed)
You have been informed that you will receive no further pain medications from this practitioner, and you are instructed you need to schedule and keep an appointment with the Pain Clinic.

## 2010-10-04 LAB — COMPLETE METABOLIC PANEL WITH GFR
ALT: 15 U/L (ref 0–53)
AST: 21 U/L (ref 0–37)
Albumin: 4.4 g/dL (ref 3.5–5.2)
Alkaline Phosphatase: 64 U/L (ref 39–117)
BUN: 12 mg/dL (ref 6–23)
CO2: 29 mEq/L (ref 19–32)
Calcium: 9.6 mg/dL (ref 8.4–10.5)
Chloride: 95 mEq/L — ABNORMAL LOW (ref 96–112)
Creat: 0.88 mg/dL (ref 0.50–1.35)
GFR, Est African American: >60 mL/min (ref >60)
GFR, Est Non African American: >60 mL/min (ref >60)
Glucose, Bld: 357 mg/dL — ABNORMAL HIGH (ref 70–99)
Potassium: 4.6 mEq/L (ref 3.5–5.3)
Sodium: 132 mEq/L — ABNORMAL LOW (ref 135–145)
Total Bilirubin: 0.6 mg/dL (ref 0.3–1.2)
Total Protein: 8.2 g/dL (ref 6.0–8.3)

## 2010-10-04 LAB — HIV-1 RNA ULTRAQUANT REFLEX TO GENTYP+
HIV 1 RNA Quant: 30300 copies/mL — ABNORMAL HIGH (ref <20)
HIV-1 RNA Quant, Log: 4.48 {Log} — ABNORMAL HIGH (ref <1.30)

## 2010-10-12 LAB — HIV-1 GENOTYPR PLUS

## 2010-10-23 ENCOUNTER — Emergency Department (HOSPITAL_COMMUNITY)
Admission: EM | Admit: 2010-10-23 | Discharge: 2010-10-24 | Disposition: A | Discharge status: Home / Self Care | Payer: Medicaid Other | Attending: Emergency Medicine | Admitting: Emergency Medicine

## 2010-10-23 DIAGNOSIS — L03019 Cellulitis of unspecified finger: Secondary | ICD-10-CM | POA: Insufficient documentation

## 2010-10-23 DIAGNOSIS — M25569 Pain in unspecified knee: Secondary | ICD-10-CM | POA: Insufficient documentation

## 2010-10-23 DIAGNOSIS — E860 Dehydration: Secondary | ICD-10-CM | POA: Insufficient documentation

## 2010-10-23 DIAGNOSIS — Z79899 Other long term (current) drug therapy: Secondary | ICD-10-CM | POA: Insufficient documentation

## 2010-10-23 DIAGNOSIS — E119 Type 2 diabetes mellitus without complications: Secondary | ICD-10-CM | POA: Insufficient documentation

## 2010-10-23 DIAGNOSIS — Z21 Asymptomatic human immunodeficiency virus [HIV] infection status: Secondary | ICD-10-CM | POA: Insufficient documentation

## 2010-10-23 DIAGNOSIS — G8929 Other chronic pain: Secondary | ICD-10-CM | POA: Insufficient documentation

## 2010-10-23 DIAGNOSIS — I10 Essential (primary) hypertension: Secondary | ICD-10-CM | POA: Insufficient documentation

## 2010-10-24 LAB — POCT I-STAT, CHEM 8
BUN: 14 mg/dL (ref 6–23)
Calcium, Ion: 1.16 mmol/L (ref 1.12–1.32)
Chloride: 99 mEq/L (ref 96–112)
Creatinine, Ser: 0.9 mg/dL (ref 0.50–1.35)
Glucose, Bld: 427 mg/dL — ABNORMAL HIGH (ref 70–99)
HCT: 40 % (ref 39.0–52.0)
Hemoglobin: 13.6 g/dL (ref 13.0–17.0)
Potassium: 4.1 mEq/L (ref 3.5–5.1)
Sodium: 135 mEq/L (ref 135–145)
TCO2: 26 mmol/L (ref 0–100)

## 2010-10-24 LAB — DIFFERENTIAL
Basophils Absolute: 0 10*3/uL (ref 0.0–0.1)
Basophils Relative: 0 % (ref 0–1)
Eosinophils Absolute: 0 10*3/uL (ref 0.0–0.7)
Eosinophils Relative: 1 % (ref 0–5)
Lymphocytes Relative: 37 % (ref 12–46)
Lymphs Abs: 2.4 10*3/uL (ref 0.7–4.0)
Monocytes Absolute: 0.3 10*3/uL (ref 0.1–1.0)
Monocytes Relative: 5 % (ref 3–12)
Neutro Abs: 3.8 10*3/uL (ref 1.7–7.7)
Neutrophils Relative %: 58 % (ref 43–77)

## 2010-10-24 LAB — CBC
HCT: 37.6 % — ABNORMAL LOW (ref 39.0–52.0)
Hemoglobin: 13.5 g/dL (ref 13.0–17.0)
MCH: 27.2 pg (ref 26.0–34.0)
MCHC: 35.9 g/dL (ref 30.0–36.0)
MCV: 75.7 fL — ABNORMAL LOW (ref 78.0–100.0)
Platelets: 181 10*3/uL (ref 150–400)
RBC: 4.97 MIL/uL (ref 4.22–5.81)
RDW: 13.1 % (ref 11.5–15.5)
WBC: 6.5 10*3/uL (ref 4.0–10.5)

## 2010-10-24 LAB — BASIC METABOLIC PANEL
BUN: 14 mg/dL (ref 6–23)
CO2: 26 mEq/L (ref 19–32)
Calcium: 9.9 mg/dL (ref 8.4–10.5)
Chloride: 91 mEq/L — ABNORMAL LOW (ref 96–112)
Creatinine, Ser: 0.9 mg/dL (ref 0.50–1.35)
GFR calc Af Amer: >60 mL/min (ref >60)
GFR calc non Af Amer: >60 mL/min (ref >60)
Glucose, Bld: 556 mg/dL (ref 70–99)
Potassium: 4 mEq/L (ref 3.5–5.1)
Sodium: 128 mEq/L — ABNORMAL LOW (ref 135–145)

## 2010-10-24 LAB — GLUCOSE, CAPILLARY: Glucose-Capillary: 354 mg/dL — ABNORMAL HIGH (ref 70–99)

## 2010-11-03 ENCOUNTER — Encounter: Payer: Self-pay | Admitting: Internal Medicine

## 2010-11-03 ENCOUNTER — Inpatient Hospital Stay (HOSPITAL_COMMUNITY)
Admission: EM | Admit: 2010-11-03 | Discharge: 2010-11-05 | DRG: 638 | Disposition: A | Discharge status: Home / Self Care | Payer: Medicaid Other | Attending: Internal Medicine | Admitting: Internal Medicine

## 2010-11-03 DIAGNOSIS — G8929 Other chronic pain: Secondary | ICD-10-CM | POA: Diagnosis present

## 2010-11-03 DIAGNOSIS — Z794 Long term (current) use of insulin: Secondary | ICD-10-CM

## 2010-11-03 DIAGNOSIS — Z21 Asymptomatic human immunodeficiency virus [HIV] infection status: Secondary | ICD-10-CM | POA: Diagnosis present

## 2010-11-03 DIAGNOSIS — E785 Hyperlipidemia, unspecified: Secondary | ICD-10-CM | POA: Diagnosis present

## 2010-11-03 DIAGNOSIS — F1920 Other psychoactive substance dependence, uncomplicated: Secondary | ICD-10-CM | POA: Diagnosis present

## 2010-11-03 DIAGNOSIS — Z9119 Patient's noncompliance with other medical treatment and regimen: Secondary | ICD-10-CM

## 2010-11-03 DIAGNOSIS — Z91199 Patient's noncompliance with other medical treatment and regimen due to unspecified reason: Secondary | ICD-10-CM

## 2010-11-03 DIAGNOSIS — E1101 Type 2 diabetes mellitus with hyperosmolarity with coma: Principal | ICD-10-CM | POA: Diagnosis present

## 2010-11-03 DIAGNOSIS — M109 Gout, unspecified: Secondary | ICD-10-CM | POA: Diagnosis present

## 2010-11-03 DIAGNOSIS — E871 Hypo-osmolality and hyponatremia: Secondary | ICD-10-CM | POA: Diagnosis present

## 2010-11-03 DIAGNOSIS — M25569 Pain in unspecified knee: Secondary | ICD-10-CM | POA: Diagnosis present

## 2010-11-03 DIAGNOSIS — I10 Essential (primary) hypertension: Secondary | ICD-10-CM | POA: Diagnosis present

## 2010-11-03 LAB — CBC
HCT: 42.8 % (ref 39.0–52.0)
Hemoglobin: 15.2 g/dL (ref 13.0–17.0)
MCH: 26.7 pg (ref 26.0–34.0)
MCHC: 35.5 g/dL (ref 30.0–36.0)
MCV: 75.1 fL — ABNORMAL LOW (ref 78.0–100.0)
Platelets: 248 10*3/uL (ref 150–400)
RBC: 5.7 MIL/uL (ref 4.22–5.81)
RDW: 12.8 % (ref 11.5–15.5)
WBC: 5.7 10*3/uL (ref 4.0–10.5)

## 2010-11-03 LAB — DIFFERENTIAL
Basophils Absolute: 0 10*3/uL (ref 0.0–0.1)
Basophils Relative: 0 % (ref 0–1)
Eosinophils Absolute: 0.1 10*3/uL (ref 0.0–0.7)
Eosinophils Relative: 1 % (ref 0–5)
Lymphocytes Relative: 34 % (ref 12–46)
Lymphs Abs: 1.9 10*3/uL (ref 0.7–4.0)
Monocytes Absolute: 0.4 10*3/uL (ref 0.1–1.0)
Monocytes Relative: 7 % (ref 3–12)
Neutro Abs: 3.3 10*3/uL (ref 1.7–7.7)
Neutrophils Relative %: 58 % (ref 43–77)

## 2010-11-03 LAB — URINALYSIS, ROUTINE W REFLEX MICROSCOPIC
Bilirubin Urine: NEGATIVE
Glucose, UA: >1000 mg/dL — AB
Hgb urine dipstick: NEGATIVE
Ketones, ur: 15 mg/dL — AB
Leukocytes, UA: NEGATIVE
Nitrite: NEGATIVE
Protein, ur: NEGATIVE mg/dL
Specific Gravity, Urine: 1.032 — ABNORMAL HIGH (ref 1.005–1.030)
Urobilinogen, UA: 1 mg/dL (ref 0.0–1.0)
pH: 6 (ref 5.0–8.0)

## 2010-11-03 LAB — POCT I-STAT, CHEM 8
BUN: 27 mg/dL — ABNORMAL HIGH (ref 6–23)
Calcium, Ion: 1.12 mmol/L (ref 1.12–1.32)
Chloride: 91 mEq/L — ABNORMAL LOW (ref 96–112)
Creatinine, Ser: 1.2 mg/dL (ref 0.50–1.35)
Glucose, Bld: >700 mg/dL (ref 70–99)
HCT: 49 % (ref 39.0–52.0)
Hemoglobin: 16.7 g/dL (ref 13.0–17.0)
Potassium: 4.9 mEq/L (ref 3.5–5.1)
Sodium: 124 mEq/L — ABNORMAL LOW (ref 135–145)
TCO2: 27 mmol/L (ref 0–100)

## 2010-11-03 LAB — POCT I-STAT 3, ART BLOOD GAS (G3+)
Acid-Base Excess: 3 mmol/L — ABNORMAL HIGH (ref 0.0–2.0)
Bicarbonate: 29.4 mEq/L — ABNORMAL HIGH (ref 20.0–24.0)
O2 Saturation: 37 %
TCO2: 31 mmol/L (ref 0–100)
pCO2 arterial: 50.8 mmHg — ABNORMAL HIGH (ref 35.0–45.0)
pH, Arterial: 7.371 (ref 7.350–7.450)
pO2, Arterial: 23 mmHg — CL (ref 80.0–100.0)

## 2010-11-03 LAB — RAPID URINE DRUG SCREEN, HOSP PERFORMED
Amphetamines: NOT DETECTED
Barbiturates: NOT DETECTED
Benzodiazepines: NOT DETECTED
Cocaine: NOT DETECTED
Opiates: NOT DETECTED
Tetrahydrocannabinol: NOT DETECTED

## 2010-11-03 LAB — URINE MICROSCOPIC-ADD ON: Urine-Other: NONE SEEN

## 2010-11-03 LAB — GLUCOSE, CAPILLARY
Glucose-Capillary: >600 mg/dL (ref 70–99)
Glucose-Capillary: >600 mg/dL (ref 70–99)
Glucose-Capillary: 535 mg/dL — ABNORMAL HIGH (ref 70–99)
Glucose-Capillary: 400 mg/dL — ABNORMAL HIGH (ref 70–99)

## 2010-11-03 NOTE — H&P (Signed)
Hospital Admission Note Date: 11/03/2010  Patient name:  Keith [name]   Medical record number:  [name] Date of birth:  08/26/1965  Age: 45 y.o. Gender:  male PCP:    Hans Eden, MD, MD  Medical Service:   Internal Medicine Teaching Service   Attending physician:  Dr. Eppie Gibson First Contact:   Dr. Dorthula Nettles Pager: [phone]  Second Contact:   Dr. Ina Homes  Pager: [phone] After Hours:    First Contact   Pager: [phone]      Second Contact  Pager: [phone]   Chief Complaint:generalized weakness and hyperglycemia  History of Present Illness: Patient is a 45 y.o. male with a PMHx of HIV, diabetes mellitus type II, hypertension, substance abuse presents to the ED for hypoglycemia and generalized weakness. Patient reports that he began week on Thursday after running out of his insulin and that he had a fall in which is right knee gave out. He normally takes Lantus 120 every morning.  He also stated that Medicaid would not approve for an early refill for his insulin therefore he has not been taking any insulin since Thursday.  He endorses polyuria, polydipsia, and left knee pain but denies any fever, nausea/vomiting, chills, numbness or tingling, abdominal pain, dysuria or burning sensation. In the ED, patient was sugar was found to be greater than 700.  Current Outpatient Medications: Current Outpatient Prescriptions  Medication Sig Dispense Refill  . aspirin 81 MG EC tablet Take 81 mg by mouth daily.        Marland Kitchen atazanavir (REYATAZ) 300 MG capsule Take 1 capsule (300 mg total) by mouth daily.  30 capsule  5  . atenolol (TENORMIN) 50 MG tablet Take 1 tablet (50 mg total) by mouth daily.  30 tablet  5  . dapsone 100 MG tablet Take 1 tablet (100 mg total) by mouth daily.  30 tablet  5  . emtricitabine-tenofovir (TRUVADA) 200-300 MG per tablet Take 1 tablet by mouth daily.  30 tablet  5  . glucose blood (ACCU-CHEK AVIVA PLUS) test strip Use to check blood sugar twice a day - morning and  evening before eating- when you give the Lantus insulin       . insulin glargine (LANTUS SOLOSTAR) 100 UNIT/ML injection Inject 120 Units into the skin every morning.  45 mL  12  . Insulin Pen Needle (PEN NEEDLES) 31G X 6 MM MISC 1 each by Does not apply route daily. Use to inject insulin one time a day  100 each  11  . Lancets (ACCU-CHEK MULTICLIX) lancets Use to check blood sugar up to 2 times daily       . lisinopril (PRINIVIL,ZESTRIL) 40 MG tablet Take 1 tablet (40 mg total) by mouth daily.  30 tablet  5  . metFORMIN (GLUCOPHAGE) 1000 MG tablet Take 1 tablet (1,000 mg total) by mouth 2 (two) times daily.  60 tablet  5  . ritonavir (NORVIR) 100 MG TABS Take 1 tablet (100 mg total) by mouth daily.  30 tablet  5  . testosterone (ANDROGEL) 50 MG/5GM GEL Place onto the skin daily. Apply one packet every day       . zidovudine (RETROVIR) 300 MG tablet Take 1 tablet (300 mg total) by mouth 2 (two) times daily.  60 tablet  5    Allergies: Sulfonamide  Past Medical History: Past Medical History  Diagnosis Date  . HIV (human immunodeficiency virus infection)     CD4 count 220 in 04/2009, VL 30,300 (09/2010)  .  Diabetes type 2, uncontrolled     HgA1c >20 (08/2010)  . Hypertension   . Genital warts   . Erectile dysfunction   . Chronic knee pain     right  . Osteomyelitis     h/o left thumb  Left index: cellulitis/abscess    Past Surgical History: None  Family History: Family History  Problem Relation Age of Onset  . Hypertension Mother   . Arthritis Father   . Hypertension Father   . Hypertension Brother   . Cancer Maternal Grandmother 16    unknown type of cancer  . Depression Paternal Grandmother     Social History: History  Denies any alcohol, smoking, or illicit drug.  Applying for disability  Review of Systems: Pertinent items are noted in HPI.  Vital Signs: T: 97.9 P: 96 BP: 141/116 RR: 19 O2 sat: 99% RA   Physical Exam: General: Vital signs reviewed and noted.  Well-developed, well-nourished, in no acute distress; alert, appropriate and cooperative throughout examination.  Head: Normocephalic, atraumatic.  Eyes: PERRL, EOMI, No signs of anemia or jaundince.  Nose: Mucous membranes moist, not inflammed, nonerythematous.  Throat: Oropharynx nonerythematous,  dry, few scattered white thrush on buccal mucosa,  Neck: No deformities, masses, or tenderness noted.Supple, No carotid Bruits, no JVD.  Lungs:  Normal respiratory effort. Clear to auscultation BL without crackles or wheezes.  Heart: RRR. S1 and S2 normal without gallop, murmur, or rubs.  Abdomen:  BS normoactive. Soft, Nondistended, non-tender.  No masses or organomegaly.  Extremities: No pretibial edema. Right knee tenderness, limited ROM 2/2 pain.  Left 4th finger: healed wound with pink granulation tissue without any drainage or tenderness or erythema.  Patient reports biting his nail and became infected  Neurologic: A&O X3, CN II - XII are grossly intact. Could not assess motor strength because of poor effort, Sensations intact to light touch  Skin: No visible rashes, scars.   Lab results:   TCO2                                     27                0-100            mmol/L  Ionized Calcium                          1.12              1.12-1.32        mmol/L  Hemoglobin (HGB)                         16.7              13.0-17.0        g/dL  Hematocrit (HCT)                         49.0              39.0-52.0        %  Sodium (NA)                              124        l      135-145  mEq/L  Potassium (K)                            4.9               3.5-5.1          mEq/L  Chloride                                 91         l      96-112           mEq/L  Glucose                                  >700       H      70-99            mg/dL  BUN                                      27         h      6-23             mg/dL  Creatinine                               1.20              0.50-1.35         mg/dL  WBC                                      5.7               4.0-10.5         K/uL  RBC                                      5.70              4.22-5.81        MIL/uL  Hemoglobin (HGB)                         15.2              13.0-17.0        g/dL  Hematocrit (HCT)                         42.8              39.0-52.0        %  MCV                                      75.1       l      78.0-100.0       fL  MCH -  26.7              26.0-34.0        pg  MCHC                                     35.5              30.0-36.0        g/dL  RDW                                      12.8              11.5-15.5        %  Platelet Count (PLT)                     248               150-400          K/uL  Neutrophils, %                           58                43-77            %  Lymphocytes, %                           34                12-46            %  Monocytes, %                             7                 3-12             %  Eosinophils, %                           1                 0-5              %  Basophils, %                             0                 0-1              %  Neutrophils, Absolute                    3.3               1.7-7.7          K/uL  Lymphocytes, Absolute                    1.9               0.7-4.0          K/uL  Monocytes, Absolute  0.4               0.1-1.0          K/uL  Eosinophils, Absolute                    0.1               0.0-0.7          K/uL  Basophils, Absolute                      0.0               0.0-0.1          K/uL   Color, Urine                             YELLOW            YELLOW  Appearance                               CLEAR             CLEAR  Specific Gravity                         1.032      h      1.005-1.030  pH                                       6.0               5.0-8.0  Urine Glucose                            >1000      a      NEG              mg/dL  Bilirubin                                 NEGATIVE          NEG  Ketones                                  15         a      NEG              mg/dL  Blood                                    NEGATIVE          NEG  Protein                                  NEGATIVE          NEG              mg/dL  Urobilinogen  1.0               0.0-1.0          mg/dL  Nitrite                                  NEGATIVE          NEG  Leukocytes                               NEGATIVE          NEG  Microscopy:  Urine-Other                              SEE NOTE.    NO FORMED ELEMENTS SEEN ON URINE MICROSCOPIC EXAMINATION  pH, Blood Gas                            7.371             7.350-7.450  pCO2                                     50.8       h      35.0-45.0        mmHg  pO2, Blood Gas                           23.0       L      80.0-100.0       mmHg  I-STAT-Comment                           SEE NOTE.    NOTIFIED PHYSICIAN  Bicarbonate                              29.4       h      20.0-24.0        mEq/L  TCO2                                     31                0-100            mmol/L  Acid-Base Excess                         3.0        h      0.0-2.0          mmol/L  Oxygen Saturation                        37.0                               %  Collection Site  SEE NOTE.     RADIAL, ALLEN'S TEST ACCEPTABLE  Drawn By                                 RT  Sample Type                              ARTERIAL   (likely this is a venous sample, not arterial)   Assessment & Plan: 1. Hyperosmolar hyperglycemia state: with blood glucose of >700.  Hemoglobin A1c in May 2012 was greater than 20, poorly controlled. This patient has a history of noncompliance with insulin and has recurrent admissions for HONK.  His anion gap is 6 therefore, patient is not acidotic.  No evidence of infection as the urinalysis is negative for nitrite/leukocytes and patient is afebrile.  Lung exam is clear to  auscultation and patient denies any SOB or cough or sputum production. -Admit to SDU -Start glucostabilizer with IVF 250 cc/hr NS until CBG is less than 250, then switch to 125 cc/hr NS -BMET q3 hr and will replete K accordingly -check CMP, UDS -AM labs: BMP, CBC -Care management consult for medication assistance  2. Hyponatremia: secondary to hyperglycemia.  Na is 124 but corrected is 134.  Will continue IVF and monitor with BMET.  3. HTN: stable, will continue Lisinopril 40mg  poqd and Atenolol 50mg  qd  4. Left knee pain- this appears to be a chronic problem for patient.  Knee Xray in 2011 shows early degenerative changes.  Patient has a history of narcotic abuse and according to infectious disease clinic, patient is not supposed to receive any narcotics from this clinic from now on. Patient was scheduled for PT however he has not been seen by them. He was advised to take NSAIDs for his knee pain however he continues to request for hydrocodone.   -Will give Tylenol when necessary pain  5. human immunodeficiency virus infection- patient has a history of non-compliance in which he stopped for several months and was recently restarted his ART meds.  According to Dr. Henreitta Leber note, patient has wild genotype. Last viral load was in 09/2010: 30,300 and CD4 on 04/2009: 220.   -Will continue current regimen -Check CD4 today -Continue Dapsone for prophylaxis  DVT PPX - Lovenox 40mg  SQ daily     Julius Bowels, M.D. (PGY2):  ____________________________________    Date/ Time:    ____________________________________     Charlann Lange, M.D. (PGY1):   ____________________________________    Date/ Time:    ____________________________________     I have seen and examined the patient. I reviewed the resident/fellow note and agree with the findings and plan of care as documented. My additions and revisions are included.   Signature:  ____________________________________________     Internal Medicine  Teaching Service Attending    Date:    ____________________________________________

## 2010-11-04 LAB — URINE CULTURE
Colony Count: NO GROWTH
Culture  Setup Time: 201207290157
Culture: NO GROWTH

## 2010-11-04 LAB — CBC
HCT: 37.5 % — ABNORMAL LOW (ref 39.0–52.0)
HCT: 36.8 % — ABNORMAL LOW (ref 39.0–52.0)
Hemoglobin: 12.9 g/dL — ABNORMAL LOW (ref 13.0–17.0)
Hemoglobin: 13.1 g/dL (ref 13.0–17.0)
MCH: 25.7 pg — ABNORMAL LOW (ref 26.0–34.0)
MCH: 26.6 pg (ref 26.0–34.0)
MCHC: 34.4 g/dL (ref 30.0–36.0)
MCHC: 35.6 g/dL (ref 30.0–36.0)
MCV: 74.7 fL — ABNORMAL LOW (ref 78.0–100.0)
MCV: 74.6 fL — ABNORMAL LOW (ref 78.0–100.0)
Platelets: 246 10*3/uL (ref 150–400)
Platelets: 221 10*3/uL (ref 150–400)
RBC: 5.02 MIL/uL (ref 4.22–5.81)
RBC: 4.93 MIL/uL (ref 4.22–5.81)
RDW: 13 % (ref 11.5–15.5)
RDW: 12.9 % (ref 11.5–15.5)
WBC: 4.7 10*3/uL (ref 4.0–10.5)
WBC: 4.8 10*3/uL (ref 4.0–10.5)

## 2010-11-04 LAB — DIFFERENTIAL
Basophils Absolute: 0 10*3/uL (ref 0.0–0.1)
Basophils Absolute: 0 10*3/uL (ref 0.0–0.1)
Basophils Relative: 0 % (ref 0–1)
Basophils Relative: 0 % (ref 0–1)
Eosinophils Absolute: 0.1 10*3/uL (ref 0.0–0.7)
Eosinophils Absolute: 0 10*3/uL (ref 0.0–0.7)
Eosinophils Relative: 2 % (ref 0–5)
Eosinophils Relative: 1 % (ref 0–5)
Lymphocytes Relative: 38 % (ref 12–46)
Lymphocytes Relative: 27 % (ref 12–46)
Lymphs Abs: 1.8 10*3/uL (ref 0.7–4.0)
Lymphs Abs: 1.3 10*3/uL (ref 0.7–4.0)
Monocytes Absolute: 0.6 10*3/uL (ref 0.1–1.0)
Monocytes Absolute: 0.4 10*3/uL (ref 0.1–1.0)
Monocytes Relative: 13 % — ABNORMAL HIGH (ref 3–12)
Monocytes Relative: 9 % (ref 3–12)
Neutro Abs: 2.3 10*3/uL (ref 1.7–7.7)
Neutro Abs: 3 10*3/uL (ref 1.7–7.7)
Neutrophils Relative %: 48 % (ref 43–77)
Neutrophils Relative %: 63 % (ref 43–77)

## 2010-11-04 LAB — COMPREHENSIVE METABOLIC PANEL
ALT: 17 U/L (ref 0–53)
AST: 18 U/L (ref 0–37)
Albumin: 3.5 g/dL (ref 3.5–5.2)
Alkaline Phosphatase: 72 U/L (ref 39–117)
BUN: 19 mg/dL (ref 6–23)
CO2: 26 mEq/L (ref 19–32)
Calcium: 9.6 mg/dL (ref 8.4–10.5)
Chloride: 93 mEq/L — ABNORMAL LOW (ref 96–112)
Creatinine, Ser: 0.81 mg/dL (ref 0.50–1.35)
GFR calc Af Amer: >60 mL/min (ref >60)
GFR calc non Af Amer: >60 mL/min (ref >60)
Glucose, Bld: 394 mg/dL — ABNORMAL HIGH (ref 70–99)
Potassium: 3.4 mEq/L — ABNORMAL LOW (ref 3.5–5.1)
Sodium: 131 mEq/L — ABNORMAL LOW (ref 135–145)
Total Bilirubin: 0.2 mg/dL — ABNORMAL LOW (ref 0.3–1.2)
Total Protein: 7.9 g/dL (ref 6.0–8.3)

## 2010-11-04 LAB — BASIC METABOLIC PANEL
BUN: 19 mg/dL (ref 6–23)
BUN: 15 mg/dL (ref 6–23)
BUN: 15 mg/dL (ref 6–23)
BUN: 16 mg/dL (ref 6–23)
BUN: 18 mg/dL (ref 6–23)
BUN: 16 mg/dL (ref 6–23)
CO2: 27 mEq/L (ref 19–32)
CO2: 25 mEq/L (ref 19–32)
CO2: 27 mEq/L (ref 19–32)
CO2: 23 mEq/L (ref 19–32)
CO2: 26 mEq/L (ref 19–32)
CO2: 26 mEq/L (ref 19–32)
Calcium: 9.5 mg/dL (ref 8.4–10.5)
Calcium: 9.1 mg/dL (ref 8.4–10.5)
Calcium: 9.3 mg/dL (ref 8.4–10.5)
Calcium: 9.1 mg/dL (ref 8.4–10.5)
Calcium: 9.3 mg/dL (ref 8.4–10.5)
Calcium: 9.3 mg/dL (ref 8.4–10.5)
Chloride: 98 mEq/L (ref 96–112)
Chloride: 98 mEq/L (ref 96–112)
Chloride: 98 mEq/L (ref 96–112)
Chloride: 96 mEq/L (ref 96–112)
Chloride: 94 mEq/L — ABNORMAL LOW (ref 96–112)
Chloride: 95 mEq/L — ABNORMAL LOW (ref 96–112)
Creatinine, Ser: 0.7 mg/dL (ref 0.50–1.35)
Creatinine, Ser: 0.68 mg/dL (ref 0.50–1.35)
Creatinine, Ser: 0.69 mg/dL (ref 0.50–1.35)
Creatinine, Ser: 0.81 mg/dL (ref 0.50–1.35)
Creatinine, Ser: 0.77 mg/dL (ref 0.50–1.35)
Creatinine, Ser: 0.82 mg/dL (ref 0.50–1.35)
GFR calc Af Amer: >60 mL/min (ref >60)
GFR calc Af Amer: >60 mL/min (ref >60)
GFR calc Af Amer: >60 mL/min (ref >60)
GFR calc Af Amer: >60 mL/min (ref >60)
GFR calc Af Amer: >60 mL/min (ref >60)
GFR calc Af Amer: >60 mL/min (ref >60)
GFR calc non Af Amer: >60 mL/min (ref >60)
GFR calc non Af Amer: >60 mL/min (ref >60)
GFR calc non Af Amer: >60 mL/min (ref >60)
GFR calc non Af Amer: >60 mL/min (ref >60)
GFR calc non Af Amer: >60 mL/min (ref >60)
GFR calc non Af Amer: >60 mL/min (ref >60)
Glucose, Bld: 177 mg/dL — ABNORMAL HIGH (ref 70–99)
Glucose, Bld: 197 mg/dL — ABNORMAL HIGH (ref 70–99)
Glucose, Bld: 204 mg/dL — ABNORMAL HIGH (ref 70–99)
Glucose, Bld: 409 mg/dL — ABNORMAL HIGH (ref 70–99)
Glucose, Bld: 488 mg/dL — ABNORMAL HIGH (ref 70–99)
Glucose, Bld: 413 mg/dL — ABNORMAL HIGH (ref 70–99)
Potassium: 3.4 mEq/L — ABNORMAL LOW (ref 3.5–5.1)
Potassium: 4 mEq/L (ref 3.5–5.1)
Potassium: 4 mEq/L (ref 3.5–5.1)
Potassium: 4.5 mEq/L (ref 3.5–5.1)
Potassium: 4.3 mEq/L (ref 3.5–5.1)
Potassium: 4 mEq/L (ref 3.5–5.1)
Sodium: 136 mEq/L (ref 135–145)
Sodium: 132 mEq/L — ABNORMAL LOW (ref 135–145)
Sodium: 133 mEq/L — ABNORMAL LOW (ref 135–145)
Sodium: 128 mEq/L — ABNORMAL LOW (ref 135–145)
Sodium: 128 mEq/L — ABNORMAL LOW (ref 135–145)
Sodium: 129 mEq/L — ABNORMAL LOW (ref 135–145)

## 2010-11-04 LAB — GLUCOSE, CAPILLARY
Glucose-Capillary: 123 mg/dL — ABNORMAL HIGH (ref 70–99)
Glucose-Capillary: 167 mg/dL — ABNORMAL HIGH (ref 70–99)
Glucose-Capillary: 191 mg/dL — ABNORMAL HIGH (ref 70–99)
Glucose-Capillary: 191 mg/dL — ABNORMAL HIGH (ref 70–99)
Glucose-Capillary: 200 mg/dL — ABNORMAL HIGH (ref 70–99)
Glucose-Capillary: 276 mg/dL — ABNORMAL HIGH (ref 70–99)
Glucose-Capillary: 395 mg/dL — ABNORMAL HIGH (ref 70–99)
Glucose-Capillary: 399 mg/dL — ABNORMAL HIGH (ref 70–99)
Glucose-Capillary: 86 mg/dL (ref 70–99)
Glucose-Capillary: 150 mg/dL — ABNORMAL HIGH (ref 70–99)
Glucose-Capillary: 382 mg/dL — ABNORMAL HIGH (ref 70–99)
Glucose-Capillary: 444 mg/dL — ABNORMAL HIGH (ref 70–99)
Glucose-Capillary: 407 mg/dL — ABNORMAL HIGH (ref 70–99)

## 2010-11-04 LAB — OSMOLALITY: Osmolality: 291 mOsm/kg (ref 275–300)

## 2010-11-04 LAB — MRSA PCR SCREENING: MRSA by PCR: POSITIVE — AB

## 2010-11-05 DIAGNOSIS — E119 Type 2 diabetes mellitus without complications: Secondary | ICD-10-CM

## 2010-11-05 DIAGNOSIS — I10 Essential (primary) hypertension: Secondary | ICD-10-CM

## 2010-11-05 LAB — GLUCOSE, CAPILLARY
Glucose-Capillary: 247 mg/dL — ABNORMAL HIGH (ref 70–99)
Glucose-Capillary: 404 mg/dL — ABNORMAL HIGH (ref 70–99)
Glucose-Capillary: 147 mg/dL — ABNORMAL HIGH (ref 70–99)
Glucose-Capillary: 237 mg/dL — ABNORMAL HIGH (ref 70–99)

## 2010-11-05 LAB — BASIC METABOLIC PANEL
BUN: 14 mg/dL (ref 6–23)
BUN: 15 mg/dL (ref 6–23)
BUN: 16 mg/dL (ref 6–23)
BUN: 16 mg/dL (ref 6–23)
CO2: 26 mEq/L (ref 19–32)
CO2: 26 mEq/L (ref 19–32)
CO2: 26 mEq/L (ref 19–32)
CO2: 28 mEq/L (ref 19–32)
Calcium: 8.9 mg/dL (ref 8.4–10.5)
Calcium: 9 mg/dL (ref 8.4–10.5)
Calcium: 9.3 mg/dL (ref 8.4–10.5)
Calcium: 9.4 mg/dL (ref 8.4–10.5)
Chloride: 100 mEq/L (ref 96–112)
Chloride: 102 mEq/L (ref 96–112)
Chloride: 102 mEq/L (ref 96–112)
Chloride: 96 mEq/L (ref 96–112)
Creatinine, Ser: 0.71 mg/dL (ref 0.50–1.35)
Creatinine, Ser: 0.72 mg/dL (ref 0.50–1.35)
Creatinine, Ser: 0.77 mg/dL (ref 0.50–1.35)
Creatinine, Ser: 0.86 mg/dL (ref 0.50–1.35)
GFR calc Af Amer: >60 mL/min (ref >60)
GFR calc Af Amer: >60 mL/min (ref >60)
GFR calc Af Amer: >60 mL/min (ref >60)
GFR calc Af Amer: >60 mL/min (ref >60)
GFR calc non Af Amer: >60 mL/min (ref >60)
GFR calc non Af Amer: >60 mL/min (ref >60)
GFR calc non Af Amer: >60 mL/min (ref >60)
GFR calc non Af Amer: >60 mL/min (ref >60)
Glucose, Bld: 210 mg/dL — ABNORMAL HIGH (ref 70–99)
Glucose, Bld: 234 mg/dL — ABNORMAL HIGH (ref 70–99)
Glucose, Bld: 251 mg/dL — ABNORMAL HIGH (ref 70–99)
Glucose, Bld: 362 mg/dL — ABNORMAL HIGH (ref 70–99)
Potassium: 3.8 mEq/L (ref 3.5–5.1)
Potassium: 3.8 mEq/L (ref 3.5–5.1)
Potassium: 3.8 mEq/L (ref 3.5–5.1)
Potassium: 4.2 mEq/L (ref 3.5–5.1)
Sodium: 130 mEq/L — ABNORMAL LOW (ref 135–145)
Sodium: 135 mEq/L (ref 135–145)
Sodium: 135 mEq/L (ref 135–145)
Sodium: 135 mEq/L (ref 135–145)

## 2010-11-05 LAB — T-HELPER CELLS (CD4) COUNT (NOT AT ARMC)
CD4 % Helper T Cell: 4 % — ABNORMAL LOW (ref 33–55)
CD4 T Cell Abs: 60 uL — ABNORMAL LOW (ref 400–2700)

## 2010-11-05 IMAGING — CR DG CHEST 2V
2 series · 2 of 2 positions shown · non-contrast
Comparison: Chest x-ray 09/11/2009.

CLINICAL DATA: Diabetic with hyperglycemia.

CHEST - 2 VIEW

[w chest pa]
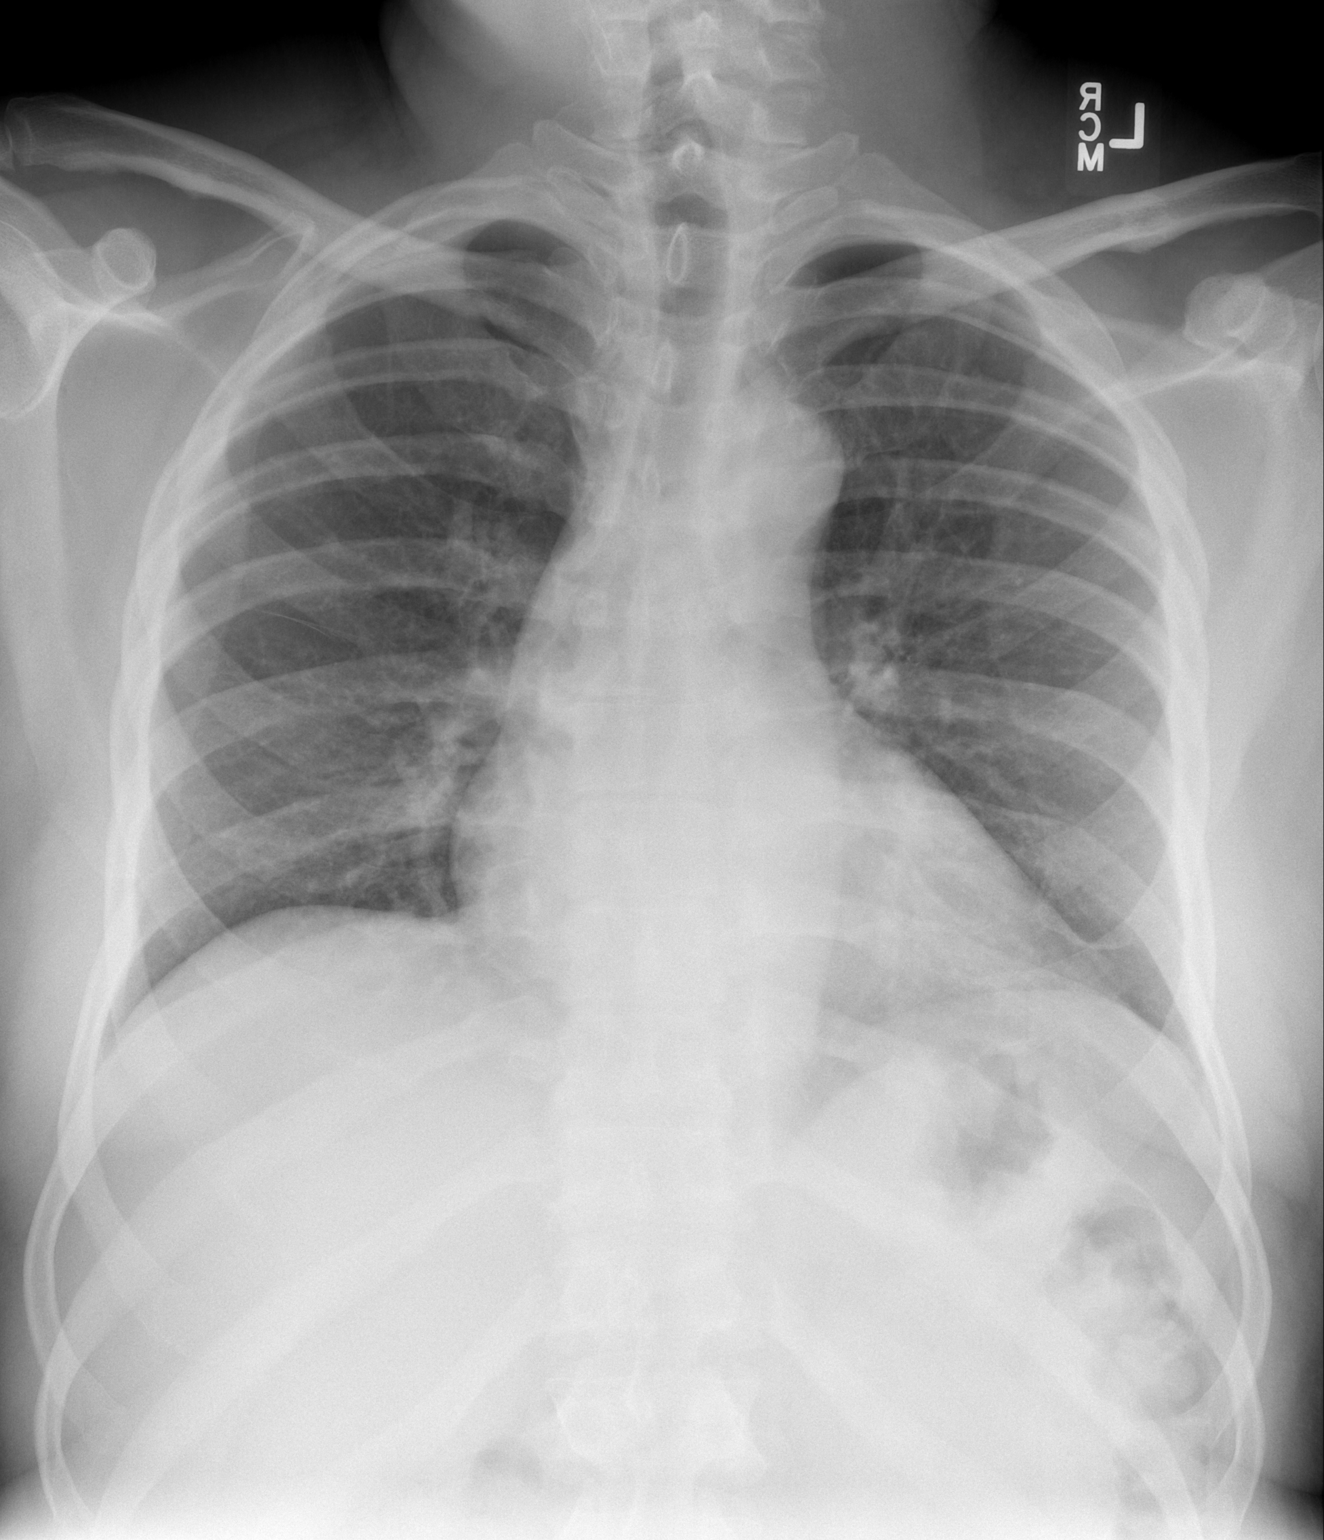

[w chest lat]
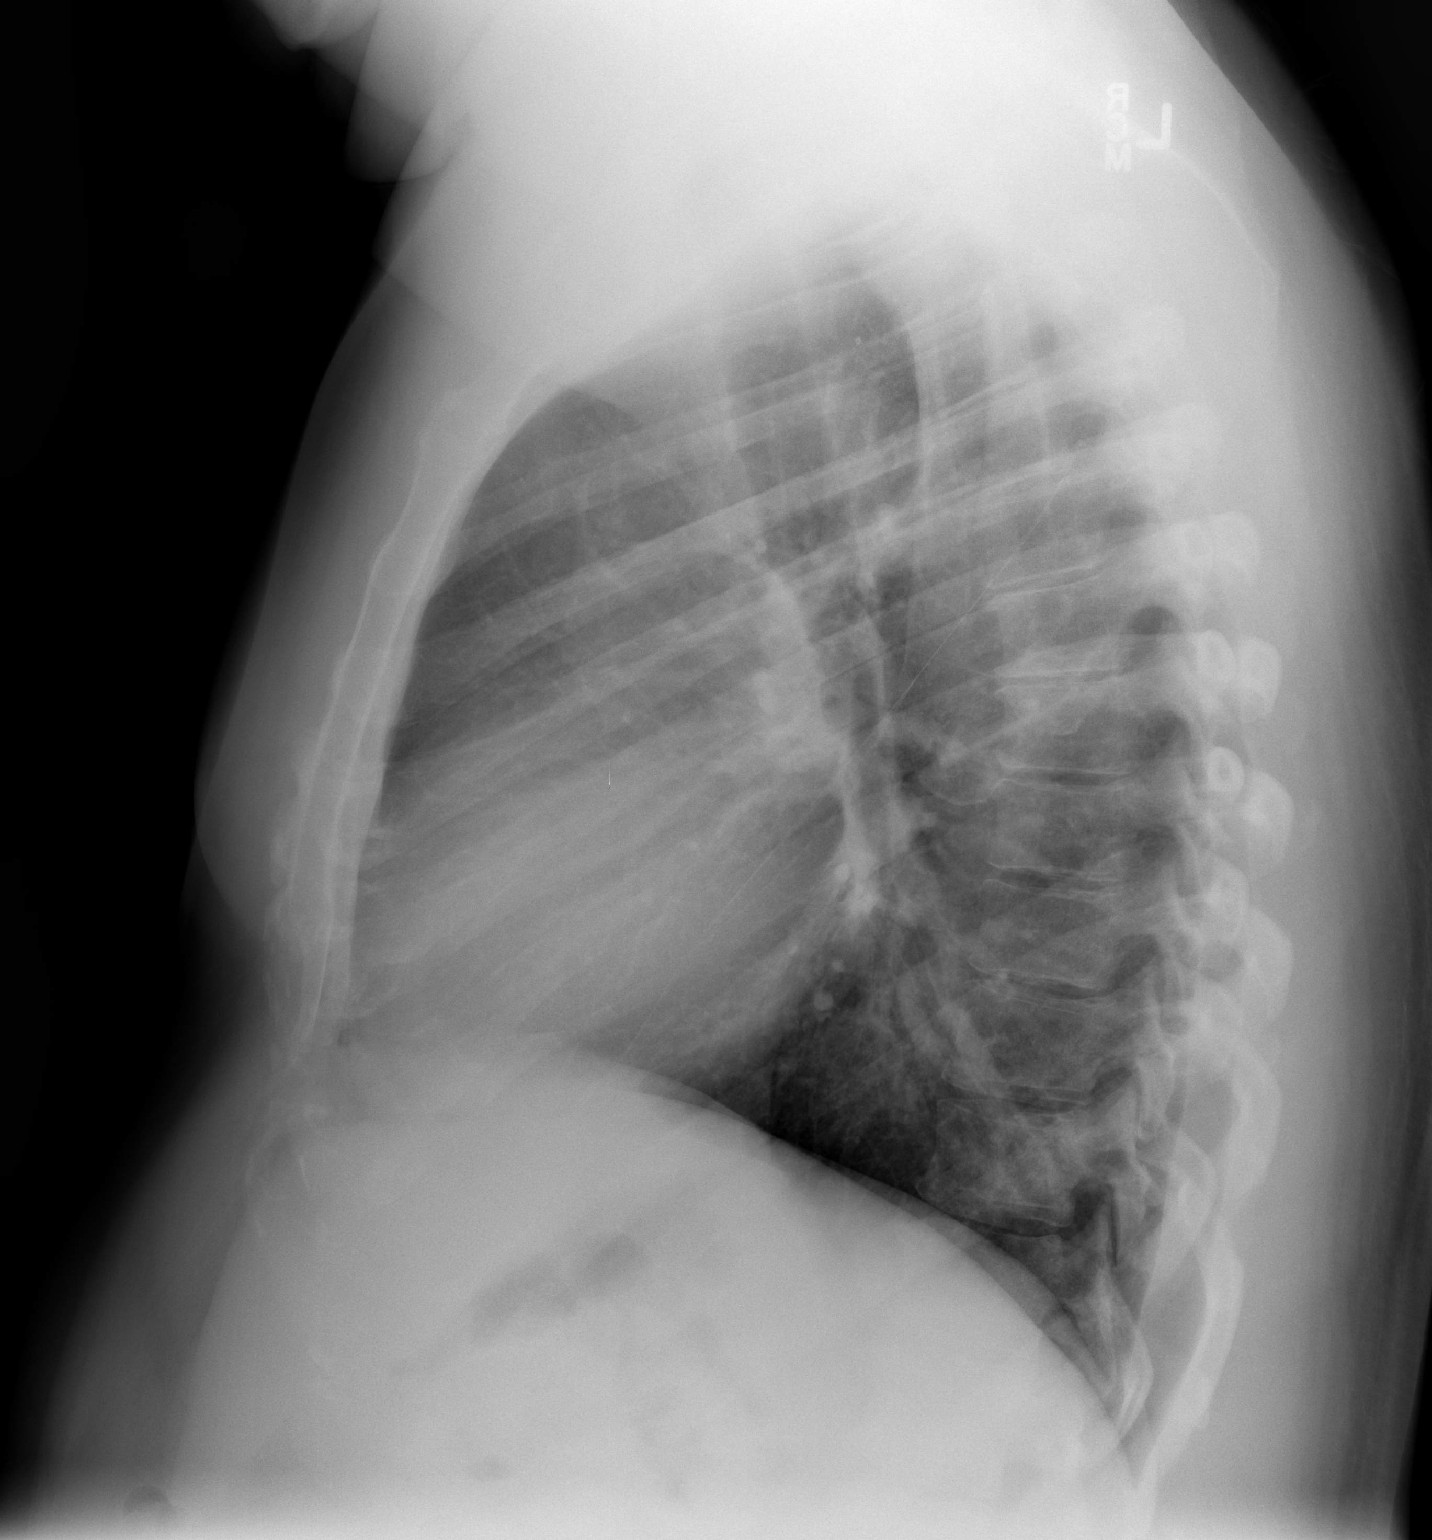

[2 of 2 positions shown; findings below may reference images not displayed]

FINDINGS: The cardiac silhouette, mediastinal and hilar contours
are within normal limits and stable.  There are mild chronic
bronchitic type lung changes but no acute pulmonary findings.
Minimal left basilar atelectasis is noted.  The bony thorax is
intact.
IMPRESSION: Minimal streaky left basilar atelectasis.  No infiltrates, edema or
effusions.

## 2010-11-11 NOTE — Discharge Summary (Signed)
NAMEROBERTSON, MODGLIN NO.:  [id number]  MEDICAL RECORD NO.:  [id number]  LOCATION:  [facility]                         FACILITY:  [facility]  PHYSICIAN:  [name], MD     DATE OF BIRTH:  Mar 26, 1966  DATE OF ADMISSION:  11/03/2010 DATE OF DISCHARGE:  11/05/2010                              DISCHARGE SUMMARY   DISCHARGE DIAGNOSES: 1. Hyperglycemia. 2. Noncompliance. 3. Diabetes type 2. 4. Human immunodeficiency virus, on HAART medication. 5. Hypertension. 6. Dyslipidemia. 7. Gouty arthritis. 8. Chronic knee pain. 9. Narcotic dependence. 10.History of drug abuse.  DISCHARGE MEDICATIONS: 1. AndroGel 1 packet topical daily. 2. Atenolol 50 mg p.o. daily. 3. Dapsone 100 mg p.o. daily. 4. Lantus 120 units subcu daily. 5. Lisinopril 40 mg p.o. daily. 6. Metformin 1000 mg p.o. b.i.d. 7. Norvir 100 mg p.o. daily. 8. Reyataz 300 mg p.o. daily.  DISPOSITION AND FOLLOWUP:  Mr. Lingren has appointment with the [facility] and Dr. Guy Sandifer on August 30 at 11:15 a.m. At that visit, his compliance with insulin and HIV medications should be  discussed. He also has an appointment in the ID clinic at a date relayed to him.  PROCEDURES PERFORMED:  None.  CONSULTATIONS:  None.  BRIEF ADMITTING HISTORY AND PHYSICAL:  This is a 45 year old man with a past medical history of HIV, diabetes type 2, hypertension, and substance abuse, who presented to the emergency room for hyperglycemia and generalized weakness.  He reported that the Thursday prior to admission he began running out of insulin and had taken a fall in which his right knee gave out.  He normally takes Lantus 120 units every morning.  He said that Medicaid would not approve his early refill of insulin.  He endorses polyuria, polydipsia, left knee pain, but denies fever, vomiting, nausea, chills, numbness, weakness, tingling, or abdominal pain.  In the emergency room, his sugar was found to be  greater than 700.  ADMISSION PHYSICAL EXAM: VITAL SIGNS:  Temperature 97.9, pulse 96, blood pressure 141/56,  respiratory rate 19, O2 saturation 99% on room air. GENERAL:  Well-developed, well-nourished, in no acute distress. LUNGS:  Clear to auscultation bilaterally. HEART:  Regular rate and rhythm.  Normal S1 and S2. ABDOMEN:  Soft, nontender, nondistended. EXTREMITIES:  No edema.  Right knee tenderness with limited range of  motion secondary to pain.  Healed wound on his left fourth finger. NEUROLOGIC.  A and O x 3.  Cranial nerves grossly intact.  Could not assess motor strength because of poor effort.  LAB RESULTS:  WBC couint 5.7, hemoglobin 15.2, hematocrit 42.8, platelets 248.  Sodium 124, potassium 4.9, chloride 91, CO2 27, BUN 27, creatinine 1.20, glucose greater than 700.  Urinalysis negative.  HOSPITAL COURSE BY PROBLEM: 1. Hyperosmolar hyperglycemic state with blood glucose of greater than     700:  Hemoglobin A1c in May 2012 was greater than 20 indicating     poorly controlled diabetes.  He had no anion gap and no     altered mentation, but was admitted because he was out of insulin.     He was started on an insulin drip and responded promptly with  sugars coming down appropriately into the low 100s.  His     hyponatremia also corrected with more a recent sodium of 135.       He was treatred with an insulin sliding scale as well while in      the hospital and was controlled overnight following admission      and following discontinuation of the insulin drip.  He was      discharged the day prior to when his prescription would     be ready for insulin, so he was given a slightly reduced dose of     his normal insulin and then discharged with the assumption that he     would go straight to the pharmacy the following morning and get his     normal insulin.  He was also restarted on his metformin.  2. Hyponatremia, resolved with fluid correction and insulin  therapy.  3. Hypertension, stable.  4. Left knee pain.  Mr. Heman complained of continued knee pain and did      have an x-ray that shows degenerative changes of the knee.  He has      a history of narcotic abuse, and per the [facility],      he is not supposed to have any narcotics from our clinic for now on.       He had been scheduled for physical therapy, but has not been to see      them and was advised to take NSAIDs for his pain.  During admission,      he did receive Toradol shots for his pain, but ask specifically for      narcotics on several occasions and was not given any.  5. HIV.  Mr. Coffer has a HAART therapy history of noncompliance, and      stopped for several months but was restarted on his HAART therapy.     CD-4 count in January 2011 was 220.  CD-4 count checked this     hospitalization showed a count of 60, so he should follow-up with     the ID Clinic.  He was continued on his dapsone for prophylaxis.  DISCHARGE LABS AND VITALS:  Temperature 98.1, blood pressure 128/90, pulse 60, respiratory rate 20, O2 saturation 99% on room air.  LABORATORY DATA:  Sodium 135, potassium 3.8, chloride 102, CO2 26, glucose 147, BUN 14, creatinine 0.72.  Mr. Dumas is discharged home in stable condition with the agreement that he would get insulin the following morning.   ______________________________ Rich Reining, MD   ______________________________ [name], MD   BW/MEDQ  D:  11/05/2010  T:  11/05/2010  Job:  [id number]  cc:   [facility] Dr. Guy Sandifer  Electronically Signed by Rich Reining MD on 11/06/2010 04:38:12 PM Electronically Signed by [name]  on 11/11/2010 02:11:12 PM

## 2010-11-13 ENCOUNTER — Emergency Department (HOSPITAL_COMMUNITY)
Admission: EM | Admit: 2010-11-13 | Discharge: 2010-11-13 | Disposition: A | Discharge status: Home / Self Care | Payer: Medicaid Other | Attending: Emergency Medicine | Admitting: Emergency Medicine

## 2010-11-13 DIAGNOSIS — R631 Polydipsia: Secondary | ICD-10-CM | POA: Insufficient documentation

## 2010-11-13 DIAGNOSIS — E785 Hyperlipidemia, unspecified: Secondary | ICD-10-CM | POA: Insufficient documentation

## 2010-11-13 DIAGNOSIS — R350 Frequency of micturition: Secondary | ICD-10-CM | POA: Insufficient documentation

## 2010-11-13 DIAGNOSIS — E1169 Type 2 diabetes mellitus with other specified complication: Secondary | ICD-10-CM | POA: Insufficient documentation

## 2010-11-13 DIAGNOSIS — Z21 Asymptomatic human immunodeficiency virus [HIV] infection status: Secondary | ICD-10-CM | POA: Insufficient documentation

## 2010-11-13 DIAGNOSIS — I10 Essential (primary) hypertension: Secondary | ICD-10-CM | POA: Insufficient documentation

## 2010-11-13 DIAGNOSIS — I498 Other specified cardiac arrhythmias: Secondary | ICD-10-CM | POA: Insufficient documentation

## 2010-11-13 DIAGNOSIS — Z794 Long term (current) use of insulin: Secondary | ICD-10-CM | POA: Insufficient documentation

## 2010-11-13 LAB — CBC
HCT: 40.1 % (ref 39.0–52.0)
Hemoglobin: 14.4 g/dL (ref 13.0–17.0)
MCH: 27.1 pg (ref 26.0–34.0)
MCHC: 35.9 g/dL (ref 30.0–36.0)
MCV: 75.4 fL — ABNORMAL LOW (ref 78.0–100.0)
Platelets: 254 10*3/uL (ref 150–400)
RBC: 5.32 MIL/uL (ref 4.22–5.81)
RDW: 13.3 % (ref 11.5–15.5)
WBC: 5 10*3/uL (ref 4.0–10.5)

## 2010-11-13 LAB — URINALYSIS, ROUTINE W REFLEX MICROSCOPIC
Bilirubin Urine: NEGATIVE
Glucose, UA: >1000 mg/dL — AB
Hgb urine dipstick: NEGATIVE
Ketones, ur: NEGATIVE mg/dL
Leukocytes, UA: NEGATIVE
Nitrite: NEGATIVE
Protein, ur: NEGATIVE mg/dL
Specific Gravity, Urine: 1.033 — ABNORMAL HIGH (ref 1.005–1.030)
Urobilinogen, UA: 0.2 mg/dL (ref 0.0–1.0)
pH: 5.5 (ref 5.0–8.0)

## 2010-11-13 LAB — URINE MICROSCOPIC-ADD ON

## 2010-11-13 LAB — COMPREHENSIVE METABOLIC PANEL
ALT: 17 U/L (ref 0–53)
AST: 16 U/L (ref 0–37)
Albumin: 4.4 g/dL (ref 3.5–5.2)
Alkaline Phosphatase: 107 U/L (ref 39–117)
BUN: 14 mg/dL (ref 6–23)
CO2: 23 mEq/L (ref 19–32)
Calcium: 10 mg/dL (ref 8.4–10.5)
Chloride: 86 mEq/L — ABNORMAL LOW (ref 96–112)
Creatinine, Ser: 1.01 mg/dL (ref 0.50–1.35)
GFR calc Af Amer: >60 mL/min (ref >60)
GFR calc non Af Amer: >60 mL/min (ref >60)
Glucose, Bld: 797 mg/dL (ref 70–99)
Potassium: 4.5 mEq/L (ref 3.5–5.1)
Sodium: 122 mEq/L — ABNORMAL LOW (ref 135–145)
Total Bilirubin: 0.3 mg/dL (ref 0.3–1.2)
Total Protein: 8.9 g/dL — ABNORMAL HIGH (ref 6.0–8.3)

## 2010-11-13 LAB — DIFFERENTIAL
Basophils Absolute: 0 10*3/uL (ref 0.0–0.1)
Basophils Relative: 0 % (ref 0–1)
Eosinophils Absolute: 0 10*3/uL (ref 0.0–0.7)
Eosinophils Relative: 0 % (ref 0–5)
Lymphocytes Relative: 37 % (ref 12–46)
Lymphs Abs: 1.8 10*3/uL (ref 0.7–4.0)
Monocytes Absolute: 0.4 10*3/uL (ref 0.1–1.0)
Monocytes Relative: 7 % (ref 3–12)
Neutro Abs: 2.8 10*3/uL (ref 1.7–7.7)
Neutrophils Relative %: 55 % (ref 43–77)

## 2010-11-13 LAB — GLUCOSE, CAPILLARY
Glucose-Capillary: >600 mg/dL (ref 70–99)
Glucose-Capillary: 530 mg/dL — ABNORMAL HIGH (ref 70–99)
Glucose-Capillary: 360 mg/dL — ABNORMAL HIGH (ref 70–99)

## 2010-11-20 ENCOUNTER — Encounter: Payer: Self-pay | Admitting: Adult Health

## 2010-11-20 ENCOUNTER — Ambulatory Visit (INDEPENDENT_AMBULATORY_CARE_PROVIDER_SITE_OTHER): Payer: Medicaid Other | Admitting: Adult Health

## 2010-11-20 VITALS — BP 114/80 | HR 85 | Temp 97.2°F | Ht 69.0 in | Wt 262.0 lb

## 2010-11-20 DIAGNOSIS — E1165 Type 2 diabetes mellitus with hyperglycemia: Secondary | ICD-10-CM

## 2010-11-20 DIAGNOSIS — Z79899 Other long term (current) drug therapy: Secondary | ICD-10-CM

## 2010-11-20 DIAGNOSIS — IMO0002 Reserved for concepts with insufficient information to code with codable children: Secondary | ICD-10-CM

## 2010-11-20 DIAGNOSIS — Z113 Encounter for screening for infections with a predominantly sexual mode of transmission: Secondary | ICD-10-CM

## 2010-11-20 DIAGNOSIS — B20 Human immunodeficiency virus [HIV] disease: Secondary | ICD-10-CM

## 2010-11-20 DIAGNOSIS — IMO0001 Reserved for inherently not codable concepts without codable children: Secondary | ICD-10-CM

## 2010-11-20 MED ORDER — RALTEGRAVIR POTASSIUM 400 MG PO TABS: 400.0000 mg | ORAL_TABLET | Freq: Two times a day (BID) | ORAL | Status: DC

## 2010-11-20 NOTE — Patient Instructions (Signed)
1. Stop Reyataz, and Norvir. 2. Continue Truvada (blue tablet once daily), and Retrovir (white tablet twice daily). 3. Begin Isentress 400 mg tablets (beige-colored tablet), one tablet twice daily. 4. Continue dapsone, once daily. 5. We will try to consult a medication adherence to visit you at home to see, how you're doing with your medications. 6. Bring ALL medications with you to clinic tomorrow so we may set up a pill box for you. 7. Return to clinic in 4 weeks to have labs drawn again and followup with me in 5-6 weeks.

## 2010-11-20 NOTE — Progress Notes (Signed)
Subjective:    [name], male    DOB: Jan 07, 1966, 45 y.o.   MRN: [id number]  HPI Mr. Panton presents to clinic today post hospitalization after being admitted for problems associated with his diabetes. He remains non-adherent. He was referred back to [place] to most of his medications and has not been taking his HIV medications as they were prescribed. He was referred back to [place] for reevaluation and resumption of his antiretroviral medications. Currently, he is to be taking Reyataz, Norvir, Truvada, and Retrovir. None of these medications, he states, he has been taking on a regular basis. The last genotype that was performed was consistent with wild-type virus, which indicates no adherence or adherence to less than 50% of the scheduled times. He, states he's been feeling better and has been taking all his other medications as they were prescribed by the internal medicine physicians. He is uncertain of the names.he is currently taking and does not remember the names exactly of his HIV medications. Today, he does not voice any specific complaints.   Review of Systems  Constitutional: Positive for activity change, appetite change, fatigue and unexpected weight change. Negative for fever, chills and diaphoresis.  HENT: Negative for hearing loss, congestion, facial swelling, rhinorrhea, drooling, mouth sores, neck pain, neck stiffness, postnasal drip and tinnitus.   Eyes: Negative for pain, itching and visual disturbance.  Respiratory: Negative for cough and shortness of breath.   Cardiovascular: Negative for chest pain, palpitations and leg swelling.  Gastrointestinal: Negative for nausea, vomiting, abdominal pain, diarrhea, constipation and abdominal distention.  Genitourinary: Positive for frequency. Negative for dysuria, urgency, hematuria, flank pain, enuresis and difficulty urinating.  Musculoskeletal: Positive for back pain and arthralgias.  Skin: Negative for rash and wound.    Neurological: Positive for numbness. Negative for dizziness, syncope, light-headedness and headaches.  Hematological: Negative.   Psychiatric/Behavioral: Negative.        Objective:   Physical Exam  Constitutional: He is oriented to person, place, and time. He appears well-developed.       Obese but near 20 pound weight loss noted.  HENT:  Head: Normocephalic and atraumatic.       No thrush but poor dentition noted  Eyes: Conjunctivae and EOM are normal. Pupils are equal, round, and reactive to light.  Neck: Normal range of motion. Neck supple.  Cardiovascular: Normal rate, regular rhythm and intact distal pulses.   Pulmonary/Chest: Effort normal and breath sounds normal.  Abdominal: Soft. Bowel sounds are normal. He exhibits no distension and no mass. There is no tenderness.  Musculoskeletal: He exhibits no edema and no tenderness.  Neurological: He is alert and oriented to person, place, and time. No cranial nerve deficit. He exhibits normal muscle tone. Coordination normal.  Skin: Skin is warm and dry. No rash noted. No erythema. No pallor.  Psychiatric: He has a normal mood and affect. His behavior is normal. Judgment and thought content normal.          Assessment & Plan:  1. HIV. CD4 count obtained 11/03/2010 was 60 at 4% with most recent viral load obtained. 10/04/2010 was 30,300 copies per mL. Genotype on the blood draw was pansensitive consistent with wild-type virus. Given his history and his questionable tolerance to these medications, we will change his regimen from Reyataz./, Norvir to Isentress 400 mg by mouth twice a day. We will continue his Truvada, and Retrovir for now. He is instructed to bring all of his medications to clinic tomorrow for the staff  to set up a pill box and meds. Schedule. We will also refer him to medication. Adherence nurse for home visits. He is also instructed to return to clinic in 4 weeks for repeat labs and schedule a followup visit for 5-6  weeks.  Drug regimen, was reviewed in detail, including, side effects, adverse drug reactions, and potential toxicities.  2. Diabetes Mellitus. He is instructed to schedule followup with internal medicine at [facility], so that his medications and his treatment can be monitored appropriately.  He verbally acknowledged all this information and agreed with plan of care.

## 2010-11-30 ENCOUNTER — Telehealth: Payer: Self-pay | Admitting: *Deleted

## 2010-11-30 NOTE — Telephone Encounter (Signed)
He came in asking when the nurse would be out to help him with the pill box & med management. I offered him a pillbox & to go over his meds. He declined.  Also states he is applying for disability & wanted to know if I thought he could work. Told him I did not know him & his md would be the best one to address that. His date for disability hearing is in October To provider to see about the visiting nurse

## 2010-12-03 ENCOUNTER — Ambulatory Visit: Payer: Medicaid Other | Admitting: Internal Medicine

## 2010-12-06 ENCOUNTER — Encounter: Payer: Medicaid Other | Admitting: Internal Medicine

## 2010-12-11 ENCOUNTER — Telehealth: Payer: Self-pay | Admitting: Licensed Clinical Social Worker

## 2010-12-11 ENCOUNTER — Observation Stay (HOSPITAL_COMMUNITY)
Admission: EM | Admit: 2010-12-11 | Discharge: 2010-12-12 | Disposition: A | Discharge status: Home / Self Care | Payer: Medicaid Other | Attending: Emergency Medicine | Admitting: Emergency Medicine

## 2010-12-11 DIAGNOSIS — R42 Dizziness and giddiness: Secondary | ICD-10-CM | POA: Insufficient documentation

## 2010-12-11 DIAGNOSIS — Z21 Asymptomatic human immunodeficiency virus [HIV] infection status: Secondary | ICD-10-CM | POA: Insufficient documentation

## 2010-12-11 DIAGNOSIS — E119 Type 2 diabetes mellitus without complications: Principal | ICD-10-CM | POA: Insufficient documentation

## 2010-12-11 DIAGNOSIS — R1110 Vomiting, unspecified: Secondary | ICD-10-CM | POA: Insufficient documentation

## 2010-12-11 LAB — URINALYSIS, ROUTINE W REFLEX MICROSCOPIC
Bilirubin Urine: NEGATIVE
Glucose, UA: >1000 mg/dL — AB
Hgb urine dipstick: NEGATIVE
Ketones, ur: NEGATIVE mg/dL
Leukocytes, UA: NEGATIVE
Nitrite: NEGATIVE
Protein, ur: NEGATIVE mg/dL
Specific Gravity, Urine: 1.031 — ABNORMAL HIGH (ref 1.005–1.030)
Urobilinogen, UA: 0.2 mg/dL (ref 0.0–1.0)
pH: 6.5 (ref 5.0–8.0)

## 2010-12-11 LAB — CBC
HCT: 40.3 % (ref 39.0–52.0)
Hemoglobin: 13.9 g/dL (ref 13.0–17.0)
MCH: 26.1 pg (ref 26.0–34.0)
MCHC: 34.5 g/dL (ref 30.0–36.0)
MCV: 75.8 fL — ABNORMAL LOW (ref 78.0–100.0)
Platelets: 206 10*3/uL (ref 150–400)
RBC: 5.32 MIL/uL (ref 4.22–5.81)
RDW: 13.6 % (ref 11.5–15.5)
WBC: 4.7 10*3/uL (ref 4.0–10.5)

## 2010-12-11 LAB — POCT I-STAT, CHEM 8
BUN: 14 mg/dL (ref 6–23)
Calcium, Ion: 1.24 mmol/L (ref 1.12–1.32)
Chloride: 98 mEq/L (ref 96–112)
Creatinine, Ser: 0.9 mg/dL (ref 0.50–1.35)
Glucose, Bld: 622 mg/dL (ref 70–99)
HCT: 47 % (ref 39.0–52.0)
Hemoglobin: 16 g/dL (ref 13.0–17.0)
Potassium: 4.4 mEq/L (ref 3.5–5.1)
Sodium: 131 mEq/L — ABNORMAL LOW (ref 135–145)
TCO2: 23 mmol/L (ref 0–100)

## 2010-12-11 LAB — GLUCOSE, CAPILLARY
Glucose-Capillary: 274 mg/dL — ABNORMAL HIGH (ref 70–99)
Glucose-Capillary: >600 mg/dL (ref 70–99)

## 2010-12-11 LAB — DIFFERENTIAL
Basophils Absolute: 0 10*3/uL (ref 0.0–0.1)
Basophils Relative: 0 % (ref 0–1)
Eosinophils Absolute: 0 10*3/uL (ref 0.0–0.7)
Eosinophils Relative: 1 % (ref 0–5)
Lymphocytes Relative: 31 % (ref 12–46)
Lymphs Abs: 1.5 10*3/uL (ref 0.7–4.0)
Monocytes Absolute: 0.3 10*3/uL (ref 0.1–1.0)
Monocytes Relative: 5 % (ref 3–12)
Neutro Abs: 3 10*3/uL (ref 1.7–7.7)
Neutrophils Relative %: 63 % (ref 43–77)

## 2010-12-11 LAB — COMPREHENSIVE METABOLIC PANEL
ALT: 15 U/L (ref 0–53)
AST: 18 U/L (ref 0–37)
Albumin: 4.2 g/dL (ref 3.5–5.2)
Alkaline Phosphatase: 87 U/L (ref 39–117)
BUN: 13 mg/dL (ref 6–23)
CO2: 23 mEq/L (ref 19–32)
Calcium: 10.2 mg/dL (ref 8.4–10.5)
Chloride: 91 mEq/L — ABNORMAL LOW (ref 96–112)
Creatinine, Ser: 0.79 mg/dL (ref 0.50–1.35)
GFR calc Af Amer: >60 mL/min (ref >60)
GFR calc non Af Amer: >60 mL/min (ref >60)
Glucose, Bld: 583 mg/dL (ref 70–99)
Potassium: 4.2 mEq/L (ref 3.5–5.1)
Sodium: 126 mEq/L — ABNORMAL LOW (ref 135–145)
Total Bilirubin: 0.2 mg/dL — ABNORMAL LOW (ref 0.3–1.2)
Total Protein: 8.6 g/dL — ABNORMAL HIGH (ref 6.0–8.3)

## 2010-12-11 LAB — URINE MICROSCOPIC-ADD ON

## 2010-12-11 LAB — POCT I-STAT TROPONIN I: Troponin i, poc: 0 ng/mL (ref 0.00–0.08)

## 2010-12-11 NOTE — Telephone Encounter (Signed)
Patient walked in this morning stating he was confused about what medications he was supposed to be on, his nurse has not been out to house anymore due his non compliant behavior. This was discussed with the patient in detail. I gave him a chart with the pictures of his medications on it and circled them along with the directions on how to take them. He stated this was helpful and will follow up on 12/21/2010 with Donovan Kail.

## 2010-12-12 ENCOUNTER — Ambulatory Visit: Payer: Medicaid Other | Admitting: Dietician

## 2010-12-12 LAB — GLUCOSE, CAPILLARY
Glucose-Capillary: 413 mg/dL — ABNORMAL HIGH (ref 70–99)
Glucose-Capillary: 350 mg/dL — ABNORMAL HIGH (ref 70–99)
Glucose-Capillary: 334 mg/dL — ABNORMAL HIGH (ref 70–99)
Glucose-Capillary: 213 mg/dL — ABNORMAL HIGH (ref 70–99)
Glucose-Capillary: 235 mg/dL — ABNORMAL HIGH (ref 70–99)
Glucose-Capillary: 382 mg/dL — ABNORMAL HIGH (ref 70–99)
Glucose-Capillary: 316 mg/dL — ABNORMAL HIGH (ref 70–99)

## 2010-12-13 ENCOUNTER — Ambulatory Visit: Payer: Medicaid Other | Admitting: Internal Medicine

## 2010-12-13 ENCOUNTER — Encounter: Payer: Medicaid Other | Admitting: Dietician

## 2010-12-13 ENCOUNTER — Encounter: Payer: Medicaid Other | Admitting: Internal Medicine

## 2010-12-21 ENCOUNTER — Ambulatory Visit: Payer: Medicaid Other | Admitting: Adult Health

## 2010-12-25 ENCOUNTER — Ambulatory Visit (INDEPENDENT_AMBULATORY_CARE_PROVIDER_SITE_OTHER): Payer: Medicaid Other | Admitting: Adult Health

## 2010-12-25 ENCOUNTER — Encounter: Payer: Self-pay | Admitting: Adult Health

## 2010-12-25 VITALS — BP 126/86 | HR 89 | Temp 98.0°F | Wt 275.0 lb

## 2010-12-25 DIAGNOSIS — B20 Human immunodeficiency virus [HIV] disease: Secondary | ICD-10-CM

## 2011-02-02 ENCOUNTER — Emergency Department (HOSPITAL_COMMUNITY)
Admission: EM | Admit: 2011-02-02 | Discharge: 2011-02-02 | Disposition: A | Discharge status: Home / Self Care | Payer: Medicaid Other | Attending: Emergency Medicine | Admitting: Emergency Medicine

## 2011-02-02 ENCOUNTER — Emergency Department (HOSPITAL_COMMUNITY): Payer: Medicaid Other

## 2011-02-02 DIAGNOSIS — Z8639 Personal history of other endocrine, nutritional and metabolic disease: Secondary | ICD-10-CM | POA: Insufficient documentation

## 2011-02-02 DIAGNOSIS — M1990 Unspecified osteoarthritis, unspecified site: Secondary | ICD-10-CM | POA: Insufficient documentation

## 2011-02-02 DIAGNOSIS — E119 Type 2 diabetes mellitus without complications: Secondary | ICD-10-CM | POA: Insufficient documentation

## 2011-02-02 DIAGNOSIS — Z79899 Other long term (current) drug therapy: Secondary | ICD-10-CM | POA: Insufficient documentation

## 2011-02-02 DIAGNOSIS — M25569 Pain in unspecified knee: Secondary | ICD-10-CM | POA: Insufficient documentation

## 2011-02-02 DIAGNOSIS — Z21 Asymptomatic human immunodeficiency virus [HIV] infection status: Secondary | ICD-10-CM | POA: Insufficient documentation

## 2011-02-02 DIAGNOSIS — I10 Essential (primary) hypertension: Secondary | ICD-10-CM | POA: Insufficient documentation

## 2011-02-02 DIAGNOSIS — Z862 Personal history of diseases of the blood and blood-forming organs and certain disorders involving the immune mechanism: Secondary | ICD-10-CM | POA: Insufficient documentation

## 2011-02-02 DIAGNOSIS — E785 Hyperlipidemia, unspecified: Secondary | ICD-10-CM | POA: Insufficient documentation

## 2011-02-02 DIAGNOSIS — G8929 Other chronic pain: Secondary | ICD-10-CM | POA: Insufficient documentation

## 2011-02-02 LAB — GLUCOSE, CAPILLARY: Glucose-Capillary: 248 mg/dL — ABNORMAL HIGH (ref 70–99)

## 2011-02-09 ENCOUNTER — Emergency Department (HOSPITAL_COMMUNITY)
Admission: EM | Admit: 2011-02-09 | Discharge: 2011-02-09 | Disposition: A | Discharge status: Home / Self Care | Payer: Medicaid Other | Attending: Emergency Medicine | Admitting: Emergency Medicine

## 2011-02-09 DIAGNOSIS — Z8639 Personal history of other endocrine, nutritional and metabolic disease: Secondary | ICD-10-CM | POA: Insufficient documentation

## 2011-02-09 DIAGNOSIS — Z21 Asymptomatic human immunodeficiency virus [HIV] infection status: Secondary | ICD-10-CM | POA: Insufficient documentation

## 2011-02-09 DIAGNOSIS — M25569 Pain in unspecified knee: Secondary | ICD-10-CM | POA: Insufficient documentation

## 2011-02-09 DIAGNOSIS — M1710 Unilateral primary osteoarthritis, unspecified knee: Secondary | ICD-10-CM | POA: Insufficient documentation

## 2011-02-09 DIAGNOSIS — G8929 Other chronic pain: Secondary | ICD-10-CM | POA: Insufficient documentation

## 2011-02-09 DIAGNOSIS — Z862 Personal history of diseases of the blood and blood-forming organs and certain disorders involving the immune mechanism: Secondary | ICD-10-CM | POA: Insufficient documentation

## 2011-02-09 DIAGNOSIS — I10 Essential (primary) hypertension: Secondary | ICD-10-CM | POA: Insufficient documentation

## 2011-02-09 DIAGNOSIS — E785 Hyperlipidemia, unspecified: Secondary | ICD-10-CM | POA: Insufficient documentation

## 2011-02-09 DIAGNOSIS — Z794 Long term (current) use of insulin: Secondary | ICD-10-CM | POA: Insufficient documentation

## 2011-02-09 DIAGNOSIS — E119 Type 2 diabetes mellitus without complications: Secondary | ICD-10-CM | POA: Insufficient documentation

## 2011-02-09 LAB — GLUCOSE, CAPILLARY: Glucose-Capillary: 155 mg/dL — ABNORMAL HIGH (ref 70–99)

## 2011-02-20 ENCOUNTER — Encounter (HOSPITAL_COMMUNITY): Payer: Self-pay | Admitting: *Deleted

## 2011-02-20 ENCOUNTER — Emergency Department (HOSPITAL_COMMUNITY)
Admission: EM | Admit: 2011-02-20 | Discharge: 2011-02-20 | Disposition: A | Discharge status: Home / Self Care | Payer: Medicaid Other | Attending: Emergency Medicine | Admitting: Emergency Medicine

## 2011-02-20 DIAGNOSIS — E119 Type 2 diabetes mellitus without complications: Secondary | ICD-10-CM | POA: Insufficient documentation

## 2011-02-20 DIAGNOSIS — Z21 Asymptomatic human immunodeficiency virus [HIV] infection status: Secondary | ICD-10-CM | POA: Insufficient documentation

## 2011-02-20 DIAGNOSIS — M25469 Effusion, unspecified knee: Secondary | ICD-10-CM | POA: Insufficient documentation

## 2011-02-20 DIAGNOSIS — Z794 Long term (current) use of insulin: Secondary | ICD-10-CM | POA: Insufficient documentation

## 2011-02-20 DIAGNOSIS — Z79899 Other long term (current) drug therapy: Secondary | ICD-10-CM | POA: Insufficient documentation

## 2011-02-20 DIAGNOSIS — I10 Essential (primary) hypertension: Secondary | ICD-10-CM | POA: Insufficient documentation

## 2011-02-20 DIAGNOSIS — M25569 Pain in unspecified knee: Secondary | ICD-10-CM

## 2011-02-20 DIAGNOSIS — Z7982 Long term (current) use of aspirin: Secondary | ICD-10-CM | POA: Insufficient documentation

## 2011-02-20 DIAGNOSIS — G8929 Other chronic pain: Secondary | ICD-10-CM | POA: Insufficient documentation

## 2011-02-20 MED ORDER — OXYCODONE-ACETAMINOPHEN 5-325 MG PO TABS
1.0000 | ORAL_TABLET | Freq: Once | ORAL | Status: AC
  Administered 2011-02-20: 1 via ORAL
  Filled 2011-02-20: qty 1

## 2011-02-20 NOTE — ED Notes (Signed)
No respiratory or acute distress noted resting in bed alert and oriented x 3 call light in reach.

## 2011-02-20 NOTE — ED Notes (Signed)
Steady gait noted alert and oriented x 3.

## 2011-02-20 NOTE — ED Provider Notes (Signed)
History     CSN: [id number] Arrival date & time: 02/20/2011  5:37 PM   First MD Initiated Contact with Patient 02/20/11 2124      Chief Complaint  Patient presents with  . Knee Pain    (Consider location/radiation/quality/duration/timing/severity/associated sxs/prior treatment) HPI Comments: Patient here with a history of chronic knee pain - he states that he usually takes percocet for the pain but he is out - reports that he was supposed to have surgery last month but had a death in the family - he does not know who his orthopedic surgeon is.  He states that he "forgets" to ask both him or his PCP for pain medication while there.  Patient is a 45 y.o. male presenting with knee pain. The history is provided by the patient. No language interpreter was used.  Knee Pain This is a chronic problem. The current episode started more than 1 year ago. The problem occurs constantly. The problem has been unchanged. Associated symptoms include arthralgias and joint swelling. Pertinent negatives include no abdominal pain, change in bowel habit, congestion, coughing, fatigue, fever, headaches, neck pain, numbness, rash, sore throat, urinary symptoms, vomiting or weakness. The symptoms are aggravated by nothing. He has tried nothing for the symptoms. The treatment provided no relief.    Past Medical History  Diagnosis Date  . HIV (human immunodeficiency virus infection)     CD4 count 100, VL 13800 (05/01/2010)  . Diabetes type 2, uncontrolled     HgA1c 17.6 (04/27/2010)  . Hypertension   . Genital warts   . Erectile dysfunction   . Chronic knee pain     right  . Osteomyelitis     h/o hand    History reviewed. No pertinent past surgical history.  Family History  Problem Relation Age of Onset  . Hypertension Mother   . Arthritis Father   . Hypertension Father   . Hypertension Brother   . Cancer Maternal Grandmother 77    unknown type of cancer  . Depression Paternal Grandmother      History  Substance Use Topics  . Smoking status: Never Smoker   . Smokeless tobacco: Never Used  . Alcohol Use: No      Review of Systems  Constitutional: Negative for fever and fatigue.  HENT: Negative for congestion, sore throat and neck pain.   Respiratory: Negative for cough.   Gastrointestinal: Negative for vomiting, abdominal pain and change in bowel habit.  Musculoskeletal: Positive for joint swelling and arthralgias.  Skin: Negative for rash.  Neurological: Negative for weakness, numbness and headaches.  All other systems reviewed and are negative.    Allergies  Sulfonamide derivatives  Home Medications   Current Outpatient Rx  Name Route Sig Dispense Refill  . ASPIRIN 81 MG PO TBEC Oral Take 81 mg by mouth daily.      . ATENOLOL 50 MG PO TABS Oral Take 1 tablet (50 mg total) by mouth daily. 30 tablet 5  . DAPSONE 100 MG PO TABS Oral Take 1 tablet (100 mg total) by mouth daily. 30 tablet 5  . EMTRICITABINE-TENOFOVIR 200-300 MG PO TABS Oral Take 1 tablet by mouth daily. 30 tablet 5  . INSULIN GLARGINE 100 UNIT/ML [place] SOLN Subcutaneous Inject 120 Units into the skin every morning. 45 mL 12  . LISINOPRIL 40 MG PO TABS Oral Take 1 tablet (40 mg total) by mouth daily. 30 tablet 5  . METFORMIN HCL 1000 MG PO TABS Oral Take 1 tablet (1,000 mg total)  by mouth 2 (two) times daily. 60 tablet 5  . RALTEGRAVIR POTASSIUM 400 MG PO TABS Oral Take 1 tablet (400 mg total) by mouth 2 (two) times daily. 60 tablet 5  . TESTOSTERONE 50 MG/5GM TD GEL Transdermal Place onto the skin daily. Apply one packet every day     . ZIDOVUDINE 300 MG PO TABS Oral Take 1 tablet (300 mg total) by mouth 2 (two) times daily. 60 tablet 5  . ACCU-CHEK MULTICLIX LANCETS MISC  Use to check blood sugar up to 2 times daily       BP 141/94  Pulse 110  Temp(Src) 98.4 F (36.9 C) (Oral)  Resp 18  SpO2 99%  Physical Exam  Nursing note and vitals reviewed. Constitutional: He is oriented to person,  place, and time. He appears well-developed and well-nourished.  HENT:  Head: Normocephalic and atraumatic.  Right Ear: External ear normal.  Left Ear: External ear normal.  Mouth/Throat: Oropharynx is clear and moist.  Eyes: Conjunctivae are normal. Pupils are equal, round, and reactive to light.  Neck: Normal range of motion. Neck supple.  Cardiovascular: Normal rate and normal heart sounds.   Pulmonary/Chest: Effort normal. No respiratory distress.  Abdominal: Soft. There is no tenderness.  Musculoskeletal: Normal range of motion.       Right knee: He exhibits bony tenderness. He exhibits normal range of motion, no swelling and no effusion.  Neurological: He is alert and oriented to person, place, and time.  Skin: Skin is warm and dry.  Psychiatric: He has a normal mood and affect. His behavior is normal. Judgment and thought content normal.    ED Course  Procedures (including critical care time)  Labs Reviewed - No data to display No results found.   Knee pain   MDM  Patient with long history of bilateral knee pain - believe that he is getting his pain medication only from the ER.  I will treat him here and have asked that he follow up with orthopedist tomorrow for prescriptions for pain.  He is in agreement with this plan.        Idalia Needle [place], [place] 02/20/11 2133

## 2011-02-20 NOTE — ED Notes (Signed)
Chronic bilateral knee pain. Ambulatory at triage.

## 2011-02-21 ENCOUNTER — Ambulatory Visit: Payer: Medicaid Other | Admitting: Ophthalmology

## 2011-02-21 ENCOUNTER — Encounter: Payer: Medicaid Other | Admitting: Ophthalmology

## 2011-02-21 NOTE — ED Provider Notes (Signed)
History/physical exam/procedure(s) were performed by non-physician practitioner and as supervising physician I was immediately available for consultation/collaboration. I have reviewed all notes and am in agreement with care and plan.   Shaune Pollack, MD 02/21/11 [phone]

## 2011-02-25 IMAGING — CR DG KNEE COMPLETE 4+V*R*
4 series · 4 of 4 positions shown · non-contrast
Comparison: CT of the right knee of 11/24/2008

CLINICAL DATA: Fell 1 week ago with the right knee pain

RIGHT KNEE - COMPLETE 4+ VIEW

[t knee ap right]
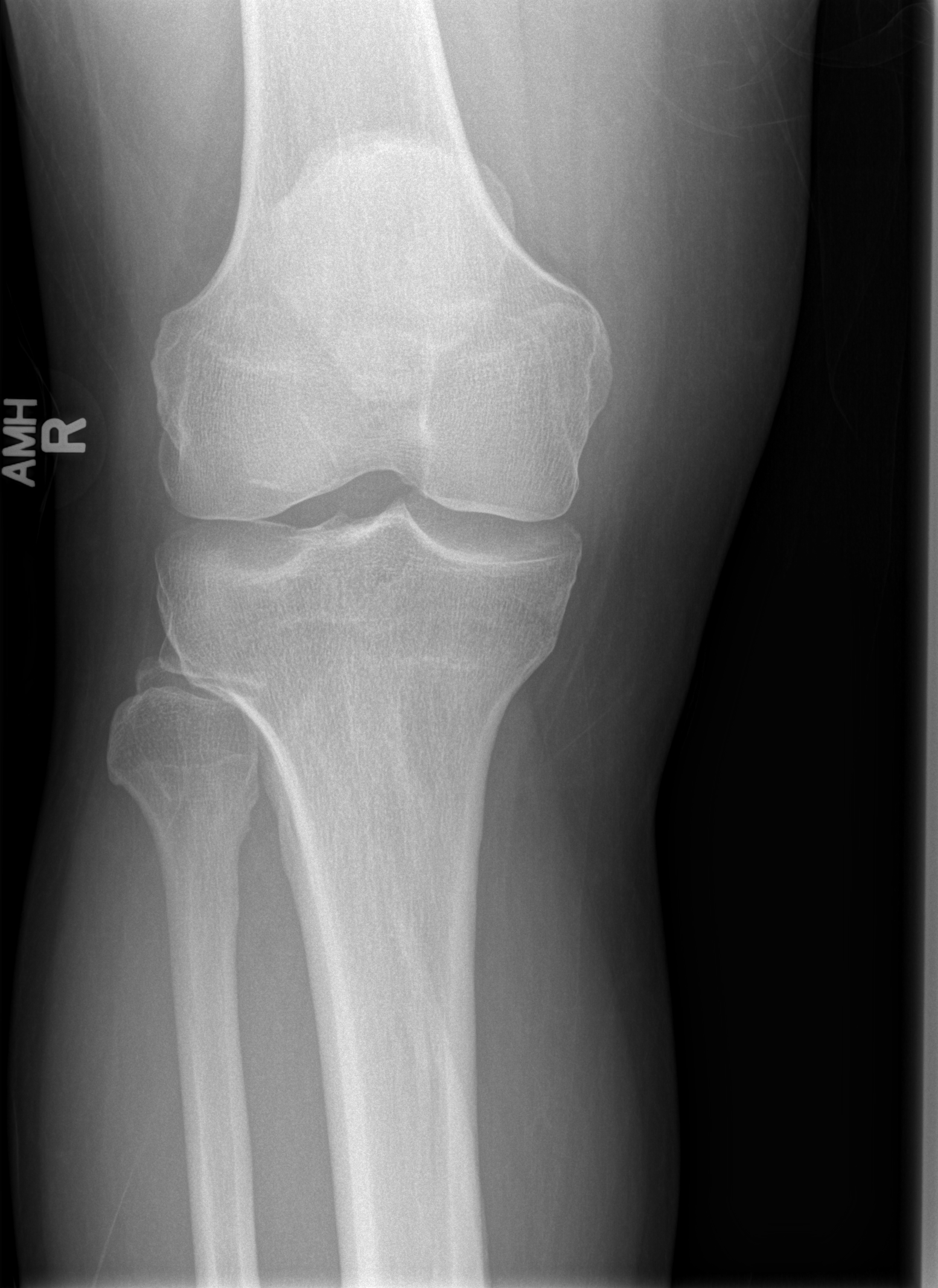

[t knee oblique right (1 of 2)]
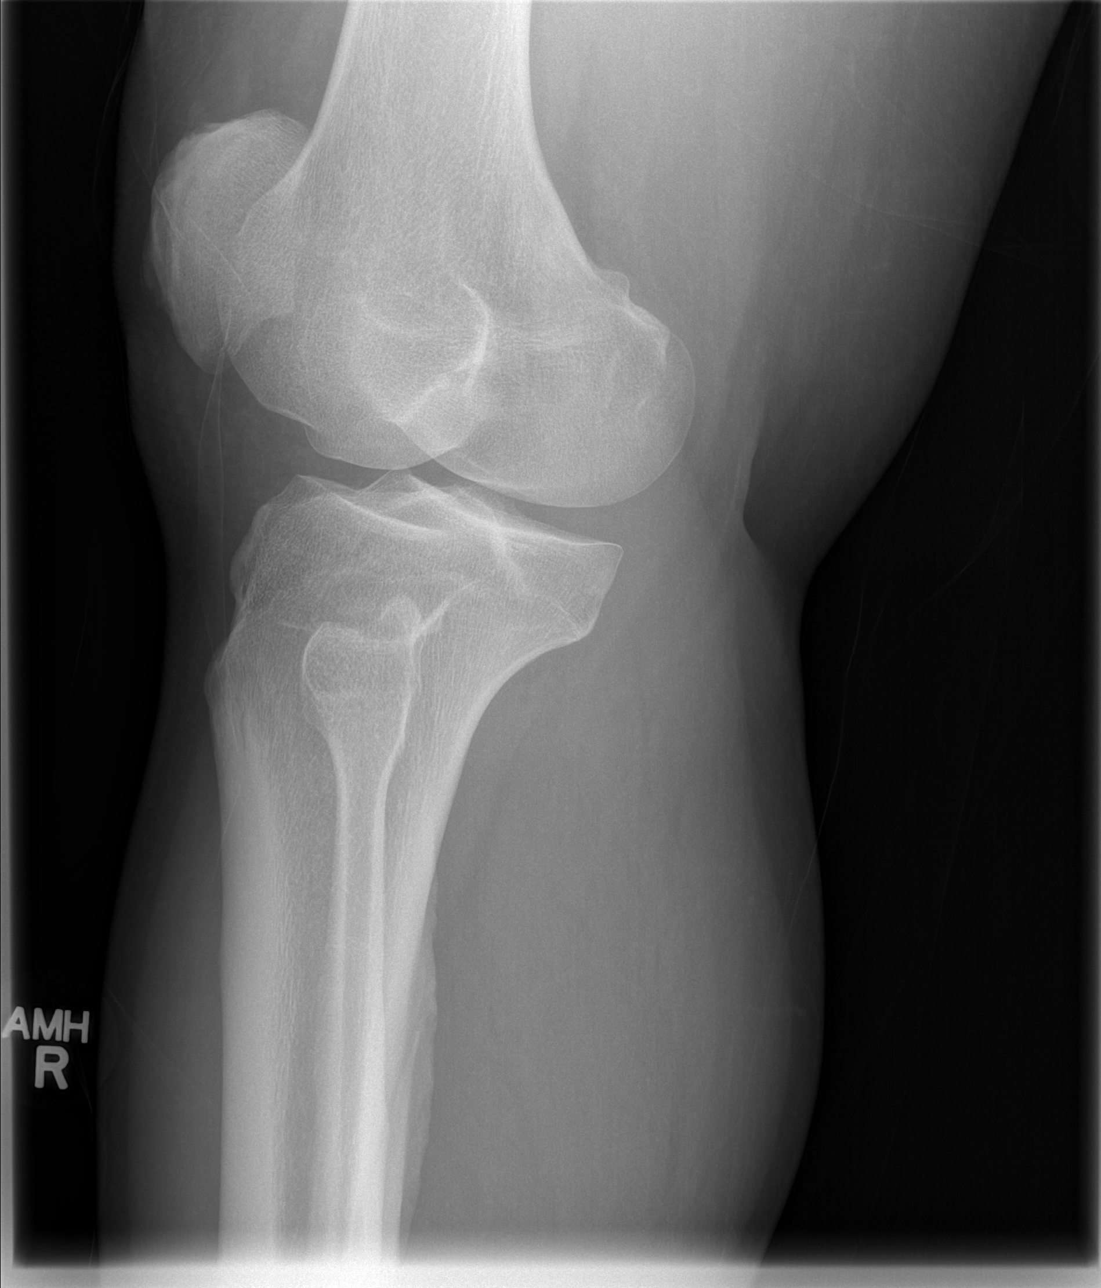

[t knee oblique right (2 of 2)]
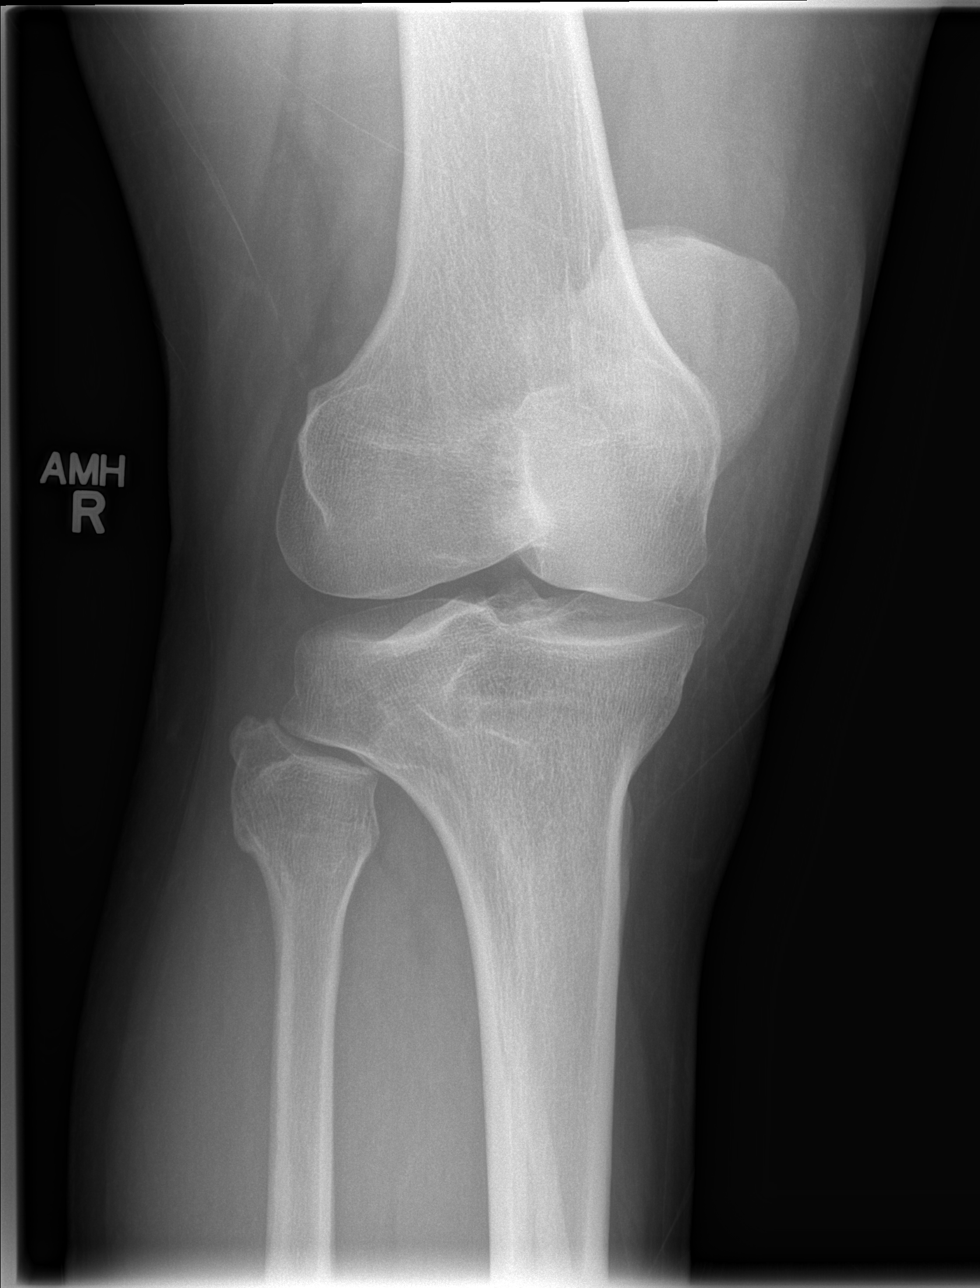

[t knee lat right]
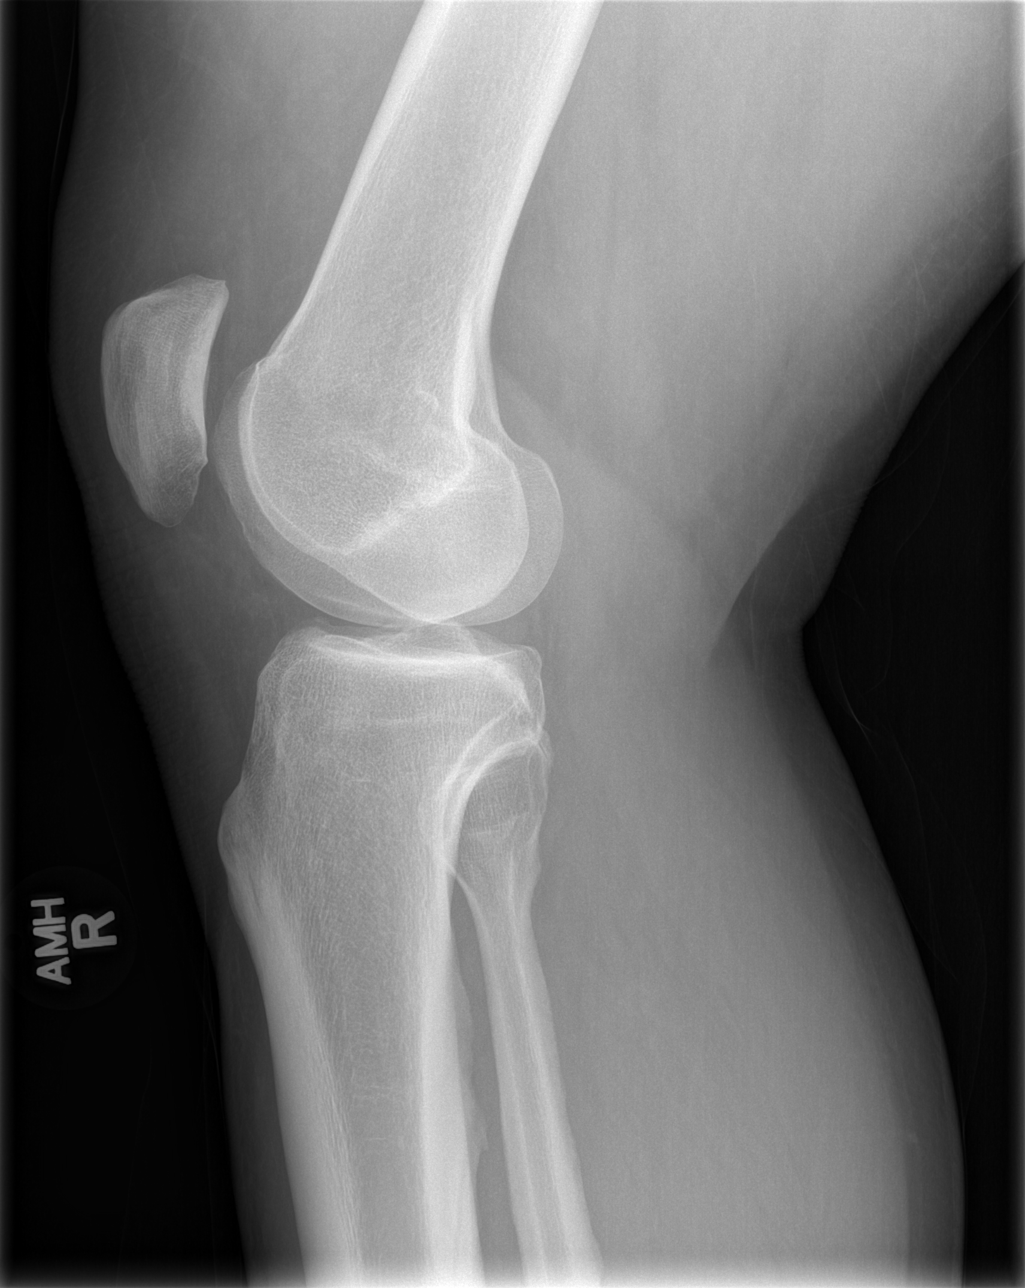

[4 of 4 positions shown; findings below may reference images not displayed]

FINDINGS: No acute fracture is seen.  Joint spaces are stable.  No
effusion is noted.
IMPRESSION: No acute abnormality.  No effusion.

## 2011-03-01 ENCOUNTER — Emergency Department (HOSPITAL_COMMUNITY)
Admission: EM | Admit: 2011-03-01 | Discharge: 2011-03-01 | Disposition: A | Discharge status: Home / Self Care | Payer: Medicaid Other | Attending: Emergency Medicine | Admitting: Emergency Medicine

## 2011-03-01 ENCOUNTER — Encounter (HOSPITAL_COMMUNITY): Payer: Self-pay | Admitting: *Deleted

## 2011-03-01 DIAGNOSIS — Z21 Asymptomatic human immunodeficiency virus [HIV] infection status: Secondary | ICD-10-CM | POA: Insufficient documentation

## 2011-03-01 DIAGNOSIS — Z794 Long term (current) use of insulin: Secondary | ICD-10-CM | POA: Insufficient documentation

## 2011-03-01 DIAGNOSIS — E119 Type 2 diabetes mellitus without complications: Secondary | ICD-10-CM | POA: Insufficient documentation

## 2011-03-01 DIAGNOSIS — M25562 Pain in left knee: Secondary | ICD-10-CM

## 2011-03-01 DIAGNOSIS — M25561 Pain in right knee: Secondary | ICD-10-CM

## 2011-03-01 DIAGNOSIS — Z7982 Long term (current) use of aspirin: Secondary | ICD-10-CM | POA: Insufficient documentation

## 2011-03-01 DIAGNOSIS — I10 Essential (primary) hypertension: Secondary | ICD-10-CM | POA: Insufficient documentation

## 2011-03-01 DIAGNOSIS — Z79899 Other long term (current) drug therapy: Secondary | ICD-10-CM | POA: Insufficient documentation

## 2011-03-01 DIAGNOSIS — G8929 Other chronic pain: Secondary | ICD-10-CM | POA: Insufficient documentation

## 2011-03-01 DIAGNOSIS — M25569 Pain in unspecified knee: Secondary | ICD-10-CM | POA: Insufficient documentation

## 2011-03-01 LAB — GLUCOSE, CAPILLARY: Glucose-Capillary: 149 mg/dL — ABNORMAL HIGH (ref 70–99)

## 2011-03-01 MED ORDER — TRAMADOL HCL 50 MG PO TABS: 50.0000 mg | ORAL_TABLET | Freq: Four times a day (QID) | ORAL | Status: AC | PRN

## 2011-03-01 NOTE — ED Provider Notes (Signed)
History     CSN: [id number] Arrival date & time: 03/01/2011  9:14 AM   First MD Initiated Contact with Patient 03/01/11 1138      Chief Complaint  Patient presents with  . Knee Pain    (Consider location/radiation/quality/duration/timing/severity/associated sxs/prior treatment) HPI Comments: Patient presents complaining of 1 month of knee pain.  He has seen an orthopedic physician for this who is established and followup with him.  He also has a primary care physician with followup on Monday.  Apparently there is already discussion of the patient being sent to a pain clinic.  Patient has no fevers.  Patient has no new joint swelling or redness.  It is worse with ambulation.  Patient reports that both his knees just "give out".  Patient has no acute falls.  No focal weakness or numbness.  Patient is a 45 y.o. male presenting with knee pain. The history is provided by the patient. No language interpreter was used.  Knee Pain This is a chronic problem. The current episode started more than 1 week ago. The problem occurs constantly. The problem has not changed since onset.Pertinent negatives include no chest pain, no abdominal pain, no headaches and no shortness of breath. The symptoms are aggravated by walking. The symptoms are relieved by nothing. He has tried nothing for the symptoms. The treatment provided no relief.    Past Medical History  Diagnosis Date  . HIV (human immunodeficiency virus infection)     CD4 count 100, VL 13800 (05/01/2010)  . Diabetes type 2, uncontrolled     HgA1c 17.6 (04/27/2010)  . Hypertension   . Genital warts   . Erectile dysfunction   . Chronic knee pain     right  . Osteomyelitis     h/o hand  . Diabetes mellitus     History reviewed. No pertinent past surgical history.  Family History  Problem Relation Age of Onset  . Hypertension Mother   . Arthritis Father   . Hypertension Father   . Hypertension Brother   . Cancer Maternal Grandmother 107     unknown type of cancer  . Depression Paternal Grandmother     History  Substance Use Topics  . Smoking status: Never Smoker   . Smokeless tobacco: Never Used  . Alcohol Use: No      Review of Systems  Constitutional: Negative.  Negative for fever and chills.  HENT: Negative.   Eyes: Negative.  Negative for discharge and redness.  Respiratory: Negative.  Negative for cough and shortness of breath.   Cardiovascular: Negative.  Negative for chest pain.  Gastrointestinal: Negative.  Negative for nausea, vomiting and abdominal pain.  Genitourinary: Negative.  Negative for hematuria.  Musculoskeletal: Positive for arthralgias. Negative for back pain.  Skin: Negative.  Negative for color change and rash.  Neurological: Negative for syncope and headaches.  Hematological: Negative.  Negative for adenopathy.  Psychiatric/Behavioral: Negative.  Negative for confusion.  All other systems reviewed and are negative.    Allergies  Sulfonamide derivatives  Home Medications   Current Outpatient Rx  Name Route Sig Dispense Refill  . ASPIRIN 81 MG PO TBEC Oral Take 81 mg by mouth daily.      . ATENOLOL 50 MG PO TABS Oral Take 1 tablet (50 mg total) by mouth daily. 30 tablet 5  . DAPSONE 100 MG PO TABS Oral Take 1 tablet (100 mg total) by mouth daily. 30 tablet 5  . EMTRICITABINE-TENOFOVIR 200-300 MG PO TABS Oral Take 1  tablet by mouth daily. 30 tablet 5  . INSULIN GLARGINE 100 UNIT/ML [place] SOLN Subcutaneous Inject 120 Units into the skin every morning. 45 mL 12  . LISINOPRIL 40 MG PO TABS Oral Take 1 tablet (40 mg total) by mouth daily. 30 tablet 5  . METFORMIN HCL 1000 MG PO TABS Oral Take 1 tablet (1,000 mg total) by mouth 2 (two) times daily. 60 tablet 5  . RALTEGRAVIR POTASSIUM 400 MG PO TABS Oral Take 1 tablet (400 mg total) by mouth 2 (two) times daily. 60 tablet 5  . TESTOSTERONE 50 MG/5GM TD GEL Transdermal Place onto the skin daily. Apply one packet every day     . ZIDOVUDINE  300 MG PO TABS Oral Take 1 tablet (300 mg total) by mouth 2 (two) times daily. 60 tablet 5    BP 115/82  Pulse 104  Temp(Src) 97.4 F (36.3 C) (Oral)  Resp 18  SpO2 98%  Physical Exam  Constitutional: He is oriented to person, place, and time. He appears well-developed and well-nourished.  Non-toxic appearance. He does not have a sickly appearance.  HENT:  Head: Normocephalic and atraumatic.  Eyes: Conjunctivae, EOM and lids are normal. Pupils are equal, round, and reactive to light.  Neck: Trachea normal, normal range of motion and full passive range of motion without pain. Neck supple.  Cardiovascular: Normal rate, regular rhythm and normal heart sounds.   Pulmonary/Chest: Effort normal and breath sounds normal. No respiratory distress.  Abdominal: Soft. Normal appearance. He exhibits no distension. There is no tenderness. There is no rebound and no CVA tenderness.  Musculoskeletal: Normal range of motion. He exhibits no edema and no tenderness.       Bilateral knees are non-erythematous, non-indurated, nontender and without crepitus.  Full range of motion bilaterally.  Patient is able to ambulate normally.  Neurological: He is alert and oriented to person, place, and time. He has normal strength.  Skin: Skin is warm, dry and intact. No rash noted.  Psychiatric: He has a normal mood and affect. His behavior is normal. Judgment and thought content normal.    ED Course  Procedures (including critical care time)  Labs Reviewed  GLUCOSE, CAPILLARY - Abnormal; Notable for the following:    Glucose-Capillary 149 (*)    All other components within normal limits   No results found.   No diagnosis found.    MDM  Patient presents with chronic knee pain x1 month.  He has no signs of acute infection or gout.  He has already seen an orthopedic physician for this and has followup establish.  He is at his followup with his primary care physician established for his diabetes and other  medical problems.  I've advised the patient to continue to follow with both of these physicians for his chronic knee pain is I've informed him that we cannot control his long-term pain needs.        Lezlie Octave, MD 03/01/11 [phone]

## 2011-03-01 NOTE — ED Notes (Signed)
CBG 149 Rn notified Kristi

## 2011-03-01 NOTE — ED Notes (Signed)
Patient reports he has bil knee pain for 1 mth.  Patient also reports he is having no appetite.  Patient has not been taking insulin but he has recently lost 90 pounds with effort.

## 2011-03-07 ENCOUNTER — Encounter (HOSPITAL_COMMUNITY): Payer: Self-pay | Admitting: Emergency Medicine

## 2011-03-07 ENCOUNTER — Emergency Department (HOSPITAL_COMMUNITY)
Admission: EM | Admit: 2011-03-07 | Discharge: 2011-03-07 | Disposition: A | Discharge status: Home / Self Care | Payer: Medicaid Other | Attending: Emergency Medicine | Admitting: Emergency Medicine

## 2011-03-07 DIAGNOSIS — Z794 Long term (current) use of insulin: Secondary | ICD-10-CM | POA: Insufficient documentation

## 2011-03-07 DIAGNOSIS — M25569 Pain in unspecified knee: Secondary | ICD-10-CM | POA: Insufficient documentation

## 2011-03-07 DIAGNOSIS — I10 Essential (primary) hypertension: Secondary | ICD-10-CM | POA: Insufficient documentation

## 2011-03-07 DIAGNOSIS — N529 Male erectile dysfunction, unspecified: Secondary | ICD-10-CM | POA: Insufficient documentation

## 2011-03-07 DIAGNOSIS — E119 Type 2 diabetes mellitus without complications: Secondary | ICD-10-CM | POA: Insufficient documentation

## 2011-03-07 DIAGNOSIS — Z21 Asymptomatic human immunodeficiency virus [HIV] infection status: Secondary | ICD-10-CM | POA: Insufficient documentation

## 2011-03-07 DIAGNOSIS — G8929 Other chronic pain: Secondary | ICD-10-CM | POA: Insufficient documentation

## 2011-03-07 MED ORDER — OXYCODONE-ACETAMINOPHEN 5-325 MG PO TABS
2.0000 | ORAL_TABLET | Freq: Once | ORAL | Status: AC
  Administered 2011-03-07: 2 via ORAL
  Filled 2011-03-07: qty 2

## 2011-03-07 MED ORDER — HYDROCODONE-ACETAMINOPHEN 7.5-500 MG/15ML PO SOLN: 15.0000 mL | Freq: Four times a day (QID) | ORAL | Status: AC | PRN

## 2011-03-07 MED ORDER — HYDROCODONE-ACETAMINOPHEN 7.5-500 MG/15ML PO SOLN: 15.0000 mL | Freq: Four times a day (QID) | ORAL | Status: DC | PRN

## 2011-03-07 MED ORDER — IBUPROFEN 200 MG PO TABS
600.0000 mg | ORAL_TABLET | Freq: Once | ORAL | Status: AC
  Administered 2011-03-07: 600 mg via ORAL
  Filled 2011-03-07: qty 3

## 2011-03-07 NOTE — ED Notes (Signed)
Bilateral knee pain x 1 month.  Pt states he lost about 75-80 lbs and he started having knee pain.

## 2011-03-07 NOTE — ED Provider Notes (Signed)
History     CSN: [id number] Arrival date & time: 03/07/2011 12:13 AM   First MD Initiated Contact with Patient 03/07/11 0309      Chief Complaint  Patient presents with  . Knee Pain    (Consider location/radiation/quality/duration/timing/severity/associated sxs/prior treatment) The history is provided by the patient.  pt reports several months of bilateral knee pain, right greater than left. Pain is worsened by walking. Has recently lost weight without improvement in his pain. Not taking anything for pain. Ibuprofen has not helped his pain. Pain is severe.   Past Medical History  Diagnosis Date  . HIV (human immunodeficiency virus infection)     CD4 count 100, VL 13800 (05/01/2010)  . Diabetes type 2, uncontrolled     HgA1c 17.6 (04/27/2010)  . Hypertension   . Genital warts   . Erectile dysfunction   . Chronic knee pain     right  . Osteomyelitis     h/o hand  . Diabetes mellitus     History reviewed. No pertinent past surgical history.  Family History  Problem Relation Age of Onset  . Hypertension Mother   . Arthritis Father   . Hypertension Father   . Hypertension Brother   . Cancer Maternal Grandmother 58    unknown type of cancer  . Depression Paternal Grandmother     History  Substance Use Topics  . Smoking status: Never Smoker   . Smokeless tobacco: Never Used  . Alcohol Use: No      Review of Systems  All other systems reviewed and are negative.    Allergies  Sulfonamide derivatives  Home Medications   Current Outpatient Rx  Name Route Sig Dispense Refill  . ASPIRIN 81 MG PO TBEC Oral Take 81 mg by mouth daily.      . ATENOLOL 50 MG PO TABS Oral Take 1 tablet (50 mg total) by mouth daily. 30 tablet 5  . DAPSONE 100 MG PO TABS Oral Take 1 tablet (100 mg total) by mouth daily. 30 tablet 5  . EMTRICITABINE-TENOFOVIR 200-300 MG PO TABS Oral Take 1 tablet by mouth daily. 30 tablet 5  . INSULIN GLARGINE 100 UNIT/ML [place] SOLN Subcutaneous Inject  120 Units into the skin every morning. 45 mL 12  . LISINOPRIL 40 MG PO TABS Oral Take 1 tablet (40 mg total) by mouth daily. 30 tablet 5  . METFORMIN HCL 1000 MG PO TABS Oral Take 1 tablet (1,000 mg total) by mouth 2 (two) times daily. 60 tablet 5  . RALTEGRAVIR POTASSIUM 400 MG PO TABS Oral Take 1 tablet (400 mg total) by mouth 2 (two) times daily. 60 tablet 5  . TESTOSTERONE 50 MG/5GM TD GEL Transdermal Place onto the skin daily. Apply one packet every day     . TRAMADOL HCL 50 MG PO TABS Oral Take 1 tablet (50 mg total) by mouth every 6 (six) hours as needed for pain. Maximum dose= 8 tablets per day 10 tablet 0  . ZIDOVUDINE 300 MG PO TABS Oral Take 1 tablet (300 mg total) by mouth 2 (two) times daily. 60 tablet 5    BP 127/87  Pulse 105  Temp(Src) 97.7 F (36.5 C) (Oral)  Resp 18  SpO2 97%  Physical Exam  Nursing note and vitals reviewed. Constitutional: He is oriented to person, place, and time. He appears well-developed and well-nourished.  HENT:  Head: Normocephalic and atraumatic.  Eyes: EOM are normal.  Neck: Normal range of motion.  Cardiovascular: Normal rate, regular  rhythm, normal heart sounds and intact distal pulses.   Pulmonary/Chest: Effort normal and breath sounds normal. No respiratory distress.  Abdominal: Soft. He exhibits no distension. There is no tenderness.  Musculoskeletal: Normal range of motion.       No swellling or erythema. No warmth. Full ROM of bilateral hips  Neurological: He is alert and oriented to person, place, and time.  Skin: Skin is warm and dry.  Psychiatric: He has a normal mood and affect. Judgment normal.    ED Course  Procedures (including critical care time)  Labs Reviewed - No data to display No results found.   1. Chronic knee pain       MDM  Chronic knee pain. Will dc home with lortab elixir. No indication for imaging        Hoy Morn, MD 03/07/11 [phone]

## 2011-03-13 ENCOUNTER — Encounter: Payer: Self-pay | Admitting: Internal Medicine

## 2011-03-13 ENCOUNTER — Other Ambulatory Visit: Payer: Medicaid Other

## 2011-03-13 ENCOUNTER — Ambulatory Visit (INDEPENDENT_AMBULATORY_CARE_PROVIDER_SITE_OTHER): Payer: Medicaid Other | Admitting: Internal Medicine

## 2011-03-13 ENCOUNTER — Other Ambulatory Visit: Payer: Self-pay | Admitting: Infectious Diseases

## 2011-03-13 DIAGNOSIS — IMO0002 Reserved for concepts with insufficient information to code with codable children: Secondary | ICD-10-CM

## 2011-03-13 DIAGNOSIS — IMO0001 Reserved for inherently not codable concepts without codable children: Secondary | ICD-10-CM

## 2011-03-13 DIAGNOSIS — Z113 Encounter for screening for infections with a predominantly sexual mode of transmission: Secondary | ICD-10-CM

## 2011-03-13 DIAGNOSIS — B20 Human immunodeficiency virus [HIV] disease: Secondary | ICD-10-CM

## 2011-03-13 DIAGNOSIS — E1165 Type 2 diabetes mellitus with hyperglycemia: Secondary | ICD-10-CM

## 2011-03-13 DIAGNOSIS — I10 Essential (primary) hypertension: Secondary | ICD-10-CM

## 2011-03-13 DIAGNOSIS — Z79899 Other long term (current) drug therapy: Secondary | ICD-10-CM

## 2011-03-13 DIAGNOSIS — M629 Disorder of muscle, unspecified: Secondary | ICD-10-CM

## 2011-03-13 DIAGNOSIS — M7630 Iliotibial band syndrome, unspecified leg: Secondary | ICD-10-CM

## 2011-03-13 LAB — COMPLETE METABOLIC PANEL WITH GFR
ALT: 28 U/L (ref 0–53)
AST: 22 U/L (ref 0–37)
Albumin: 4.4 g/dL (ref 3.5–5.2)
Alkaline Phosphatase: 51 U/L (ref 39–117)
BUN: 16 mg/dL (ref 6–23)
CO2: 22 mEq/L (ref 19–32)
Calcium: 9.8 mg/dL (ref 8.4–10.5)
Chloride: 102 mEq/L (ref 96–112)
Creat: 0.86 mg/dL (ref 0.50–1.35)
GFR, Est African American: >89 mL/min
GFR, Est Non African American: >89 mL/min
Glucose, Bld: 187 mg/dL — ABNORMAL HIGH (ref 70–99)
Potassium: 4.1 mEq/L (ref 3.5–5.3)
Sodium: 140 mEq/L (ref 135–145)
Total Bilirubin: 0.5 mg/dL (ref 0.3–1.2)
Total Protein: 7.7 g/dL (ref 6.0–8.3)

## 2011-03-13 LAB — CBC WITH DIFFERENTIAL/PLATELET
Basophils Absolute: 0 10*3/uL (ref 0.0–0.1)
Basophils Relative: 0 % (ref 0–1)
Eosinophils Absolute: 0 10*3/uL (ref 0.0–0.7)
Eosinophils Relative: 0 % (ref 0–5)
HCT: 39 % (ref 39.0–52.0)
Hemoglobin: 13.3 g/dL (ref 13.0–17.0)
Lymphocytes Relative: 43 % (ref 12–46)
Lymphs Abs: 2 10*3/uL (ref 0.7–4.0)
MCH: 27.5 pg (ref 26.0–34.0)
MCHC: 34.1 g/dL (ref 30.0–36.0)
MCV: 80.6 fL (ref 78.0–100.0)
Monocytes Absolute: 0.3 10*3/uL (ref 0.1–1.0)
Monocytes Relative: 6 % (ref 3–12)
Neutro Abs: 2.4 10*3/uL (ref 1.7–7.7)
Neutrophils Relative %: 50 % (ref 43–77)
Platelets: 233 10*3/uL (ref 150–400)
RBC: 4.84 MIL/uL (ref 4.22–5.81)
RDW: 14.2 % (ref 11.5–15.5)
WBC: 4.7 10*3/uL (ref 4.0–10.5)

## 2011-03-13 LAB — LIPID PANEL
Cholesterol: 209 mg/dL — ABNORMAL HIGH (ref 0–200)
HDL: 29 mg/dL — ABNORMAL LOW (ref >39)
LDL Cholesterol: 155 mg/dL — ABNORMAL HIGH (ref 0–99)
Total CHOL/HDL Ratio: 7.2 Ratio
Triglycerides: 126 mg/dL (ref <150)
VLDL: 25 mg/dL (ref 0–40)

## 2011-03-13 LAB — GLUCOSE, CAPILLARY: Glucose-Capillary: 206 mg/dL — ABNORMAL HIGH (ref 70–99)

## 2011-03-13 LAB — POCT GLYCOSYLATED HEMOGLOBIN (HGB A1C): Hemoglobin A1C: 8.9

## 2011-03-13 LAB — HEMOGLOBIN A1C
Hgb A1c MFr Bld: 9 % — ABNORMAL HIGH (ref <5.7)
Mean Plasma Glucose: 212 mg/dL — ABNORMAL HIGH (ref <117)

## 2011-03-13 LAB — PHOSPHORUS: Phosphorus: 4.2 mg/dL (ref 2.3–4.6)

## 2011-03-13 LAB — RPR

## 2011-03-13 MED ORDER — MELOXICAM 15 MG PO TABS: 15.0000 mg | ORAL_TABLET | Freq: Every day | ORAL | Status: DC

## 2011-03-13 NOTE — Progress Notes (Signed)
Subjective:   [name] male   DOB: [birth year] 45 y.o.   MRN: [id number]  HPI: Keith [name] is a 45 y.o. man who presents to clinic today complaining of pain in both knees for the last month.  He states that the pain starts in the middle of his lateral thighs and moves down to his knees and down the lateral side to about 6 cm below his knees.  He states that the pain is an ache that is over that area and doesn't move anywhere else.  He denies numbness, tingling, weakness in his legs, urinary or stool incontinence, or back pain.  He states that the pain is worse when he is walking and when he is lying down.  He states he walks 2-3 miles daily and has to stop because of the pain.  He asks several times throughout the interview for "something really strong for his pain."  He states that he is taking his insulin as directed.  He denies any polyuria, polydipsia, nausea, or abdominal pain.    Past Medical History  Diagnosis Date  . HIV (human immunodeficiency virus infection)     CD4 count 100, VL 13800 (05/01/2010)  . Diabetes type 2, uncontrolled     HgA1c 17.6 (04/27/2010)  . Hypertension   . Genital warts   . Erectile dysfunction   . Chronic knee pain     right  . Osteomyelitis     h/o hand  . Diabetes mellitus    Current Outpatient Prescriptions  Medication Sig Dispense Refill  . aspirin 81 MG EC tablet Take 81 mg by mouth daily.        Marland Kitchen atenolol (TENORMIN) 50 MG tablet Take 1 tablet (50 mg total) by mouth daily.  30 tablet  5  . dapsone 100 MG tablet Take 1 tablet (100 mg total) by mouth daily.  30 tablet  5  . emtricitabine-tenofovir (TRUVADA) 200-300 MG per tablet Take 1 tablet by mouth daily.  30 tablet  5  . HYDROcodone-acetaminophen (LORTAB) 7.5-500 MG/15ML solution Take 15 mLs by mouth every 6 (six) hours as needed for pain.  120 mL  0  . insulin glargine (LANTUS SOLOSTAR) 100 UNIT/ML injection Inject 120 Units into the skin every morning.  45 mL  12  .  lisinopril (PRINIVIL,ZESTRIL) 40 MG tablet Take 1 tablet (40 mg total) by mouth daily.  30 tablet  5  . metFORMIN (GLUCOPHAGE) 1000 MG tablet Take 1 tablet (1,000 mg total) by mouth 2 (two) times daily.  60 tablet  5  . raltegravir (ISENTRESS) 400 MG tablet Take 1 tablet (400 mg total) by mouth 2 (two) times daily.  60 tablet  5  . testosterone (ANDROGEL) 50 MG/5GM GEL Place onto the skin daily. Apply one packet every day       . zidovudine (RETROVIR) 300 MG tablet Take 1 tablet (300 mg total) by mouth 2 (two) times daily.  60 tablet  5   Family History  Problem Relation Age of Onset  . Hypertension Mother   . Arthritis Father   . Hypertension Father   . Hypertension Brother   . Cancer Maternal Grandmother 97    unknown type of cancer  . Depression Paternal Grandmother    History   Social History  . Marital Status: Single    Spouse Name: N/A    Number of Children: N/A  . Years of Education: N/A   Social History Main Topics  . Smoking status:  Never Smoker   . Smokeless tobacco: Never Used  . Alcohol Use: No  . Drug Use: No  . Sexually Active: Not Currently -- Male partner(s)     given condoms   Other Topics Concern  . None   Social History Narrative   Unemployed. Applying for disability.   Review of Systems: Constitutional: Denies fever, chills, diaphoresis, appetite change and fatigue.  HEENT: Denies photophobia, eye pain, redness, hearing loss, ear pain, congestion, sore throat, rhinorrhea, sneezing, mouth sores, trouble swallowing, neck pain, neck stiffness and tinnitus.   Respiratory: Denies SOB, DOE, cough, chest tightness,  and wheezing.   Cardiovascular: Denies chest pain, palpitations and leg swelling.  Gastrointestinal: Denies nausea, vomiting, abdominal pain, diarrhea, constipation, blood in stool and abdominal distention.  Genitourinary: Denies dysuria, urgency, frequency, hematuria, flank pain and difficulty urinating.  Musculoskeletal: Positive for leg pain  and gait problem.  Denies myalgias, back pain, joint swelling, and arthralgias Skin: Denies pallor, rash and wound.  Neurological: Denies dizziness, seizures, syncope, weakness, light-headedness, numbness and headaches.  Hematological: Denies adenopathy. Easy bruising, personal or family bleeding history  Psychiatric/Behavioral: Denies suicidal ideation, mood changes, confusion, nervousness, sleep disturbance and agitation  Objective:  Physical Exam: Filed Vitals:   03/13/11 0824  BP: 130/93  Pulse: 96  Temp: 97.4 F (36.3 C)  TempSrc: Oral  Height: 5\' 8"  (1.727 m)  Weight: 256 lb 11.2 oz (116.438 kg)  SpO2: 100%   Constitutional: Vital signs reviewed.  Patient is a well-developed and well-nourished man in no acute distress and cooperative with exam. Alert and oriented x3.  Head: Normocephalic and atraumatic Ear: TM normal bilaterally Mouth: no erythema or exudates, MMM Eyes: PERRL, EOMI, conjunctivae normal, No scleral icterus.  Neck: Supple, Trachea midline normal ROM, No JVD, mass, thyromegaly, or carotid bruit present.  Cardiovascular: RRR, S1 normal, S2 normal, no MRG, pulses symmetric and intact bilaterally Pulmonary/Chest: CTAB, no wheezes, rales, or rhonchi Abdominal: Soft. Non-tender, non-distended, bowel sounds are normal, no masses, organomegaly, or guarding present.  GU: no CVA tenderness Musculoskeletal: Marked muscular tension in the hips and knees.  There is point tenderness along the IT band bilaterally. Exam is limited by cooperation and pain.  SLR was negative.  Sensation is intact bilaterally.  ROM of the back is limited by pain in all planes.  No joint deformities, erythema, or stiffness. Hematology: no cervical, inginal, or axillary adenopathy.  Neurological: A&O x3, Strength is normal and symmetric bilaterally, cranial nerve II-XII are grossly intact, no focal motor deficit, sensory intact to light touch bilaterally.  Skin: Warm, dry and intact. No rash,  cyanosis, or clubbing.  Psychiatric: Normal mood and affect. speech and behavior is normal. Judgment and thought content normal. Cognition and memory are normal.   Assessment & Plan:

## 2011-03-13 NOTE — Patient Instructions (Addendum)
Iliotibial Band Syndrome Iliotibial band syndrome is pain in the outer, upper thigh. The pain is due to an inflammation (soreness) of the iliotibial band. This is a band of thick fibrous tissue that runs down the outside of the thigh. The iliotibial band begins at the hip. It extends to the outer side of the shin bone (tibia) just below the knee joint. The band works with the thigh muscles. Together they provide stability to the outside of the knee joint. Iliotibial band syndrome (ITBS) occurs when there is inflammation to this band of tissue. The irritation usually occurs over the outside of the knee joint, at the lateral epicondyle (the end of the femur bone.) The iliotibial band crosses bone and muscle at this point. Between these structures is a bursa (a cushioning sac). The bursa should make possible a smooth gliding motion. However, when inflamed, the iliotibial band does not glide easily. When inflamed, there is pain with motion of the knee. Usually the pain worsens with continued movement and the pain goes away with rest. This problem usually arises when there is a sudden increase in sports activities involving the lower extremities (your legs). Running, and playing soccer or basketball are examples of activities causing this. Others who are prone to ITBS include individuals with mechanical problems such as leg length differences, abnormality of walking, bowed legs etc. This diagnosis (learning what is wrong) is made by examination. X-rays are usually normal if only soft tissue inflammation is present. Treatment of ITBS begins with proper footwear, icing the area of pain, stretching, and resting for a period of time. Incorporating low-impact cross-training activities may also help. Your caregiver may prescribe anti-inflammatory medications as well. HOME CARE INSTRUCTIONS   Apply ice to the sore area for 15 to 20 minutes, 3 to 4 times per day. Put the ice in a plastic bag and place a towel between the  bag of ice and your skin.   Limit excessive training or eliminate training until pain goes away.   While pain is present, you may use gentle range of motion. Do not resume regular use until instructed by your caregiver. Begin use gradually. Do not increase use to the point of pain. If pain does develop, decrease use and continue the above measures. Gradually increase activities that do not cause discomfort. Do this until you finally achieve normal use.   Perform low impact activities while pain is present. Wear proper footwear.   Only take over-the-counter or prescription medicines for pain, discomfort, or fever as directed by your caregiver.  SEEK MEDICAL CARE IF:   Your pain increases or pain is not controlled with medications.   You develop new, unexplained symptoms (problems), or an increase of the symptoms that brought you to your caregiver.   Your pain and symptoms are not improving or are getting worse.  Document Released: 09/14/2001 Document Revised: 12/05/2010 Document Reviewed: 03/25/2005 [facility] Patient Information 2012 [employer], [place].  1.  Start Meloxicam 15 mg tablets take 1 table daily with a meal  2.  You can take tylenol 500 mg tablets 2 tablets every 6 hours for the pain as well  3.  Work with physical therapy to help with your flexibility.  4.  Before you go for your walks do the stretches in the instructions above.   5.  Follow up in about 1 month to see how PT is going.

## 2011-03-14 LAB — T-HELPER CELL (CD4) - (RCID CLINIC ONLY)
CD4 % Helper T Cell: 4 % — ABNORMAL LOW (ref 33–55)
CD4 T Cell Abs: 80 uL — ABNORMAL LOW (ref 400–2700)

## 2011-03-15 LAB — HIV-1 RNA ULTRAQUANT REFLEX TO GENTYP+
HIV 1 RNA Quant: 225 copies/mL — ABNORMAL HIGH (ref <20)
HIV-1 RNA Quant, Log: 2.35 {Log} — ABNORMAL HIGH (ref <1.30)

## 2011-03-17 NOTE — Assessment & Plan Note (Signed)
Lab Results  Component Value Date   HGBA1C 9.0* 03/13/2011   HGBA1C 8.9 03/13/2011   HGBA1C >20.0* 08/21/2010   Lab Results  Component Value Date   MICROALBUR 7.45* 04/10/2010   LDLCALC 155* 03/13/2011   CREATININE 0.86 03/13/2011   His A1C is much better controlled since he restarted his insulin.  He has a few other adjustments including the need of a statin.  I will forward a message to Dr. [name] and discuss STatin therapy in HIV patients.

## 2011-03-17 NOTE — Assessment & Plan Note (Signed)
His leg pain is likely secondary to IT band syndrome.  He is markedly inflexible and has pain with little movement.  He walks 2-3 miles daily and likely does very little flexibility and strengthening.  He has refused PT but states that it has helped him in the past.  We will send him to PT and have him use NSAIDs and ice for the pain in his legs.

## 2011-03-17 NOTE — Assessment & Plan Note (Signed)
Lab Results  Component Value Date   NA 140 03/13/2011   K 4.1 03/13/2011   CL 102 03/13/2011   CO2 22 03/13/2011   BUN 16 03/13/2011   CREATININE 0.86 03/13/2011   CREATININE 0.79 12/11/2010    BP Readings from Last 3 Encounters:  03/13/11 130/93  03/07/11 129/95  03/01/11 115/82    Assessment: Hypertension control:  controlled  Progress toward goals:  at goal Barriers to meeting goals:  no barriers identified  Plan: Hypertension treatment:  continue current medications

## 2011-03-18 ENCOUNTER — Encounter (HOSPITAL_COMMUNITY): Payer: Self-pay | Admitting: Emergency Medicine

## 2011-03-18 ENCOUNTER — Emergency Department (HOSPITAL_COMMUNITY)
Admission: EM | Admit: 2011-03-18 | Discharge: 2011-03-18 | Disposition: A | Discharge status: Home / Self Care | Payer: Medicaid Other | Attending: Emergency Medicine | Admitting: Emergency Medicine

## 2011-03-18 DIAGNOSIS — Z21 Asymptomatic human immunodeficiency virus [HIV] infection status: Secondary | ICD-10-CM | POA: Insufficient documentation

## 2011-03-18 DIAGNOSIS — Z7982 Long term (current) use of aspirin: Secondary | ICD-10-CM | POA: Insufficient documentation

## 2011-03-18 DIAGNOSIS — Z79899 Other long term (current) drug therapy: Secondary | ICD-10-CM | POA: Insufficient documentation

## 2011-03-18 DIAGNOSIS — G8929 Other chronic pain: Secondary | ICD-10-CM | POA: Insufficient documentation

## 2011-03-18 DIAGNOSIS — Z794 Long term (current) use of insulin: Secondary | ICD-10-CM | POA: Insufficient documentation

## 2011-03-18 DIAGNOSIS — M25569 Pain in unspecified knee: Secondary | ICD-10-CM | POA: Insufficient documentation

## 2011-03-18 DIAGNOSIS — E119 Type 2 diabetes mellitus without complications: Secondary | ICD-10-CM | POA: Insufficient documentation

## 2011-03-18 DIAGNOSIS — I10 Essential (primary) hypertension: Secondary | ICD-10-CM | POA: Insufficient documentation

## 2011-03-18 NOTE — ED Notes (Signed)
Pt stated he took four asprin tonigh to relieve the leg pain and does not know the strength.

## 2011-03-18 NOTE — ED Notes (Signed)
Pt states he has been having bilateral leg pain for the past two months.

## 2011-03-18 NOTE — ED Provider Notes (Addendum)
History     CSN: [id number] Arrival date & time: 03/18/2011  3:22 AM   First MD Initiated Contact with Patient 03/18/11 [phone]      Chief Complaint  Patient presents with  . Leg Pain    (Consider location/radiation/quality/duration/timing/severity/associated sxs/prior treatment) Patient is a 45 y.o. male presenting with knee pain. The history is provided by the patient and medical records. No language interpreter was used.  Knee Pain This is a chronic problem. The current episode started more than 1 week ago (several months). The problem occurs constantly. The problem has not changed since onset.Pertinent negatives include no chest pain, no abdominal pain, no headaches and no shortness of breath. Associated symptoms comments: No swelling no warmth no induration.  No f/c/r.  No sedintary life style no long car trips or plane trips. The symptoms are aggravated by walking. The symptoms are relieved by narcotics. He has tried a warm compress for the symptoms. The treatment provided no relief.  Reported to nurse pain is in the legs but states it is actually all in the knees.  Worse with ambulation.  Diagnosed with iliotibial band syndrome and has seen both his PMD and orthopedics for same and is frequently in the ED   Past Medical History  Diagnosis Date  . HIV (human immunodeficiency virus infection)     CD4 count 100, VL 13800 (05/01/2010)  . Diabetes type 2, uncontrolled     HgA1c 17.6 (04/27/2010)  . Hypertension   . Genital warts   . Erectile dysfunction   . Chronic knee pain     right  . Osteomyelitis     h/o hand  . Diabetes mellitus     History reviewed. No pertinent past surgical history.  Family History  Problem Relation Age of Onset  . Hypertension Mother   . Arthritis Father   . Hypertension Father   . Hypertension Brother   . Cancer Maternal Grandmother 48    unknown type of cancer  . Depression Paternal Grandmother     History  Substance Use Topics  . Smoking  status: Never Smoker   . Smokeless tobacco: Never Used  . Alcohol Use: No      Review of Systems  Constitutional: Negative for fever, chills and activity change.  HENT: Negative for facial swelling.   Eyes: Negative for discharge.  Respiratory: Negative for shortness of breath.   Cardiovascular: Negative for chest pain.  Gastrointestinal: Negative for abdominal pain and abdominal distention.  Genitourinary: Negative for difficulty urinating.  Musculoskeletal: Negative for back pain and gait problem.  Neurological: Negative for headaches.  Hematological: Negative.   Psychiatric/Behavioral: Negative.     Allergies  Sulfonamide derivatives  Home Medications   Current Outpatient Rx  Name Route Sig Dispense Refill  . ASPIRIN 81 MG PO TBEC Oral Take 81 mg by mouth daily.      . ATENOLOL 50 MG PO TABS Oral Take 1 tablet (50 mg total) by mouth daily. 30 tablet 5  . DAPSONE 100 MG PO TABS Oral Take 1 tablet (100 mg total) by mouth daily. 30 tablet 5  . EMTRICITABINE-TENOFOVIR 200-300 MG PO TABS Oral Take 1 tablet by mouth daily. 30 tablet 5  . INSULIN GLARGINE 100 UNIT/ML [place] SOLN Subcutaneous Inject 120 Units into the skin every morning. 45 mL 12  . LISINOPRIL 40 MG PO TABS Oral Take 1 tablet (40 mg total) by mouth daily. 30 tablet 5  . MELOXICAM 15 MG PO TABS Oral Take 1 tablet (15  mg total) by mouth daily. 30 tablet 1  . METFORMIN HCL 1000 MG PO TABS Oral Take 1 tablet (1,000 mg total) by mouth 2 (two) times daily. 60 tablet 5  . RALTEGRAVIR POTASSIUM 400 MG PO TABS Oral Take 1 tablet (400 mg total) by mouth 2 (two) times daily. 60 tablet 5  . TESTOSTERONE 50 MG/5GM TD GEL Transdermal Place onto the skin daily. Apply one packet every day     . ZIDOVUDINE 300 MG PO TABS Oral Take 1 tablet (300 mg total) by mouth 2 (two) times daily. 60 tablet 5  . HYDROCODONE-ACETAMINOPHEN 7.5-500 MG/15ML PO SOLN Oral Take 15 mLs by mouth every 6 (six) hours as needed for pain. 120 mL 0    BP 129/84   Pulse 118  Temp(Src) 98.6 F (37 C) (Oral)  Resp 20  SpO2 97%  Physical Exam  Constitutional: He appears well-developed and well-nourished.  HENT:  Head: Normocephalic and atraumatic.  Eyes: EOM are normal.  Neck: Normal range of motion. Neck supple.  Cardiovascular: Normal rate and regular rhythm.   Pulmonary/Chest: Breath sounds normal.  Abdominal: Soft. Bowel sounds are normal.  Musculoskeletal: Normal range of motion. He exhibits no edema and no tenderness.       Negative anterior and posterior drawer tests of B knees no laxity of the knees to varus or valgus stress  No swelling of the legs no calf tenderness negative thompson tests B DTR 2+ and symmetric B.  5/5 B LE.  No bony tenderness  Neurological: He is alert. He has normal reflexes.  Skin: Skin is warm and dry. No rash noted. No erythema.  Psychiatric: Thought content normal.    ED Course  Procedures (including critical care time)  Labs Reviewed - No data to display No results found.   No diagnosis found.   Seen by outpatient clinic residents who have discussed close follow up with the patient and need to follow up with them and his orthopedist.   MDM  Has good follow up with orthopedics and PMD.  No indication for imaging at this time.  Patient instructed to follow up with ortho and PMD for continued pain management.  Patient verbalizes understanding and agrees to follow up  Narcotics database reviewed      Unknown Flannigan K Avianna Moynahan-Rasch, MD 03/18/11 0559  Tria Noguera K Evea Sheek-Rasch, MD 03/18/11 0600

## 2011-03-18 NOTE — Progress Notes (Signed)
We were asked to evaluate this patient by the ED physician ( Dr. Sandria Manly) for his bilateral leg pain.  Patient reports having bilateral leg pain. He describes his pain as sharp shooting, starting in the middle of his lateral thighs , radiating to his calves. He states that pain is worse with walking and apparently is co-incident with his weight loss.  Patient was recently seen in the outpatient clinic on 03/13/11 for similar kind of pain which was thought to be likely IT band syndrome and he was advised to take NSAIDS and apply ice to the affected area. FYI in the epic states that he follows up with [facility] for his pain but he clearly denies it.  He states that he has tried NSAIDS without any relief. He states that he has tried tramadol in the past as well and it does not help him too. He constantly insisted that percocets and vicodin help him . As per the ER physician , he has had multiple visits in the last 2 months for this pain when at times he would be treated in the ER and other times, he will be given some prescription for narcotics.  He was offered treatment with neurontin but he says that he has tried that in the past without much help.  We were requested to make him sign a pain contract if necessary.  Of note Dr. Obie Dredge also made arrangements for PT/OT but the patient is waiting for his appointment.  PLAN: He was counseled on the narcotic side effects including N/V, constipation, addiction.            Will need to run his case on narcotic data base.            Will defer the decision to start him on narcotics to PCP.           Will try to get him a clinic appointment next week for re- evaluation of his pain.

## 2011-03-19 ENCOUNTER — Ambulatory Visit: Payer: Medicaid Other | Attending: Internal Medicine | Admitting: Physical Therapy

## 2011-03-19 DIAGNOSIS — M25569 Pain in unspecified knee: Secondary | ICD-10-CM | POA: Insufficient documentation

## 2011-03-19 DIAGNOSIS — Z21 Asymptomatic human immunodeficiency virus [HIV] infection status: Secondary | ICD-10-CM | POA: Insufficient documentation

## 2011-03-19 DIAGNOSIS — R262 Difficulty in walking, not elsewhere classified: Secondary | ICD-10-CM | POA: Insufficient documentation

## 2011-03-19 DIAGNOSIS — IMO0001 Reserved for inherently not codable concepts without codable children: Secondary | ICD-10-CM | POA: Insufficient documentation

## 2011-03-21 ENCOUNTER — Encounter: Payer: Medicaid Other | Admitting: Internal Medicine

## 2011-03-26 ENCOUNTER — Encounter (HOSPITAL_COMMUNITY): Payer: Self-pay | Admitting: Emergency Medicine

## 2011-03-26 ENCOUNTER — Encounter: Payer: Medicaid Other | Admitting: Rehabilitation

## 2011-03-26 ENCOUNTER — Emergency Department (HOSPITAL_COMMUNITY)
Admission: EM | Admit: 2011-03-26 | Discharge: 2011-03-26 | Disposition: A | Discharge status: Home / Self Care | Payer: Medicaid Other | Attending: Emergency Medicine | Admitting: Emergency Medicine

## 2011-03-26 DIAGNOSIS — I10 Essential (primary) hypertension: Secondary | ICD-10-CM | POA: Insufficient documentation

## 2011-03-26 DIAGNOSIS — Z7982 Long term (current) use of aspirin: Secondary | ICD-10-CM | POA: Insufficient documentation

## 2011-03-26 DIAGNOSIS — M25569 Pain in unspecified knee: Secondary | ICD-10-CM | POA: Insufficient documentation

## 2011-03-26 DIAGNOSIS — G8929 Other chronic pain: Secondary | ICD-10-CM

## 2011-03-26 DIAGNOSIS — Z79899 Other long term (current) drug therapy: Secondary | ICD-10-CM | POA: Insufficient documentation

## 2011-03-26 DIAGNOSIS — M79609 Pain in unspecified limb: Secondary | ICD-10-CM | POA: Insufficient documentation

## 2011-03-26 DIAGNOSIS — E119 Type 2 diabetes mellitus without complications: Secondary | ICD-10-CM | POA: Insufficient documentation

## 2011-03-26 DIAGNOSIS — Z21 Asymptomatic human immunodeficiency virus [HIV] infection status: Secondary | ICD-10-CM | POA: Insufficient documentation

## 2011-03-26 MED ORDER — KETOROLAC TROMETHAMINE 30 MG/ML IJ SOLN
30.0000 mg | Freq: Once | INTRAMUSCULAR | Status: AC
  Administered 2011-03-26: 30 mg via INTRAMUSCULAR
  Filled 2011-03-26: qty 1

## 2011-03-26 NOTE — ED Provider Notes (Signed)
History     CSN: [id number] Arrival date & time: 03/26/2011  6:48 PM   First MD Initiated Contact with Patient 03/26/11 2143      Chief Complaint  Patient presents with  . Knee Pain     HPI  History provided by the patient. Patient presents with complaints of left knee pain similar to prior knee pains. She has multiple prior emergency room visits for similar symptoms. Pain is described as a soreness and throbbing it is improved with walking and worsened with rest. Patient reports seeing a physical therapist on tertiary for these symptoms and has close followup with orthopedic specialist. He was at therapy last week. Patient does take anti-inflammatory pain medications for symptoms. He denies any new injury or trauma to the knee. Today patient states pain was worse and he wanted to come into the emergency room for better pain management. Patient denies any fever, chills, sweats.   Past Medical History  Diagnosis Date  . HIV (human immunodeficiency virus infection)     CD4 count 100, VL 13800 (05/01/2010)  . Diabetes type 2, uncontrolled     HgA1c 17.6 (04/27/2010)  . Hypertension   . Genital warts   . Erectile dysfunction   . Chronic knee pain     right  . Osteomyelitis     h/o hand  . Diabetes mellitus     History reviewed. No pertinent past surgical history.  Family History  Problem Relation Age of Onset  . Hypertension Mother   . Arthritis Father   . Hypertension Father   . Hypertension Brother   . Cancer Maternal Grandmother 68    unknown type of cancer  . Depression Paternal Grandmother     History  Substance Use Topics  . Smoking status: Never Smoker   . Smokeless tobacco: Never Used  . Alcohol Use: No      Review of Systems  Constitutional: Negative for fever and chills.  All other systems reviewed and are negative.    Allergies  Sulfa antibiotics  Home Medications   Current Outpatient Rx  Name Route Sig Dispense Refill  . ASPIRIN 81 MG PO  TBEC Oral Take 81 mg by mouth daily.      . ATENOLOL 50 MG PO TABS Oral Take 1 tablet (50 mg total) by mouth daily. 30 tablet 5  . DAPSONE 100 MG PO TABS Oral Take 1 tablet (100 mg total) by mouth daily. 30 tablet 5  . EMTRICITABINE-TENOFOVIR 200-300 MG PO TABS Oral Take 1 tablet by mouth daily. 30 tablet 5  . LISINOPRIL 40 MG PO TABS Oral Take 1 tablet (40 mg total) by mouth daily. 30 tablet 5  . MELOXICAM 15 MG PO TABS Oral Take 1 tablet (15 mg total) by mouth daily. 30 tablet 1  . METFORMIN HCL 1000 MG PO TABS Oral Take 1 tablet (1,000 mg total) by mouth 2 (two) times daily. 60 tablet 5  . RALTEGRAVIR POTASSIUM 400 MG PO TABS Oral Take 1 tablet (400 mg total) by mouth 2 (two) times daily. 60 tablet 5  . TESTOSTERONE 50 MG/5GM TD GEL Transdermal Place onto the skin daily. Apply one packet every day     . ZIDOVUDINE 300 MG PO TABS Oral Take 1 tablet (300 mg total) by mouth 2 (two) times daily. 60 tablet 5    BP 118/88  Pulse 99  Temp(Src) 98.3 F (36.8 C) (Oral)  Resp 20  SpO2 95%  Physical Exam  Nursing note and vitals reviewed.  Constitutional: He is oriented to person, place, and time. He appears well-developed and well-nourished. No distress.  HENT:  Head: Normocephalic.  Cardiovascular: Normal rate, regular rhythm and normal heart sounds.   Pulmonary/Chest: Effort normal and breath sounds normal.  Musculoskeletal: Normal range of motion. He exhibits no edema.       He has mild tenderness to palpation over left thigh hamstring areas. There are no lumps or masses. he has normal strength in left lower extremity. Patient has normal distal sensation and pulses. Normal gait.  Neurological: He is alert and oriented to person, place, and time. He has normal strength. No sensory deficit. Gait normal.  Skin: Skin is warm. No rash noted. No erythema.  Psychiatric: He has a normal mood and affect. His behavior is normal.    ED Course  Procedures (including critical care time)    1.  Chronic pain       MDM  9:45 PM patient seen and evaluated. Patient in no acute distress.  He should with no change in chronic knee pain and symptoms. No deformity or signs of acute injury. he is ambulatory on the leg. No indications for imaging at this time.        Martie Lee, [place] 03/27/11 [phone]

## 2011-03-26 NOTE — ED Notes (Signed)
Pt reports knee pain. ROM intact; no swelling.

## 2011-03-26 NOTE — ED Notes (Signed)
Pt. Reports bilateral leg pain for 2 months , denies injury or fall , ambulatory.

## 2011-03-27 ENCOUNTER — Ambulatory Visit: Payer: Medicaid Other | Admitting: Infectious Diseases

## 2011-03-27 ENCOUNTER — Telehealth: Payer: Self-pay | Admitting: *Deleted

## 2011-03-27 NOTE — Telephone Encounter (Signed)
I called him to get him to have him reschedule his appt that he missed today. Transferred to [place] at front desk

## 2011-03-28 ENCOUNTER — Encounter: Payer: Medicaid Other | Admitting: Rehabilitation

## 2011-03-28 IMAGING — CR DG CHEST 2V
2 series · 2 of 2 positions shown · non-contrast
Comparison: 12/09/2009.

CLINICAL DATA: Hyperglycemia.  Hypertension.  Diabetes.

CHEST - 2 VIEW

[w chest pa]
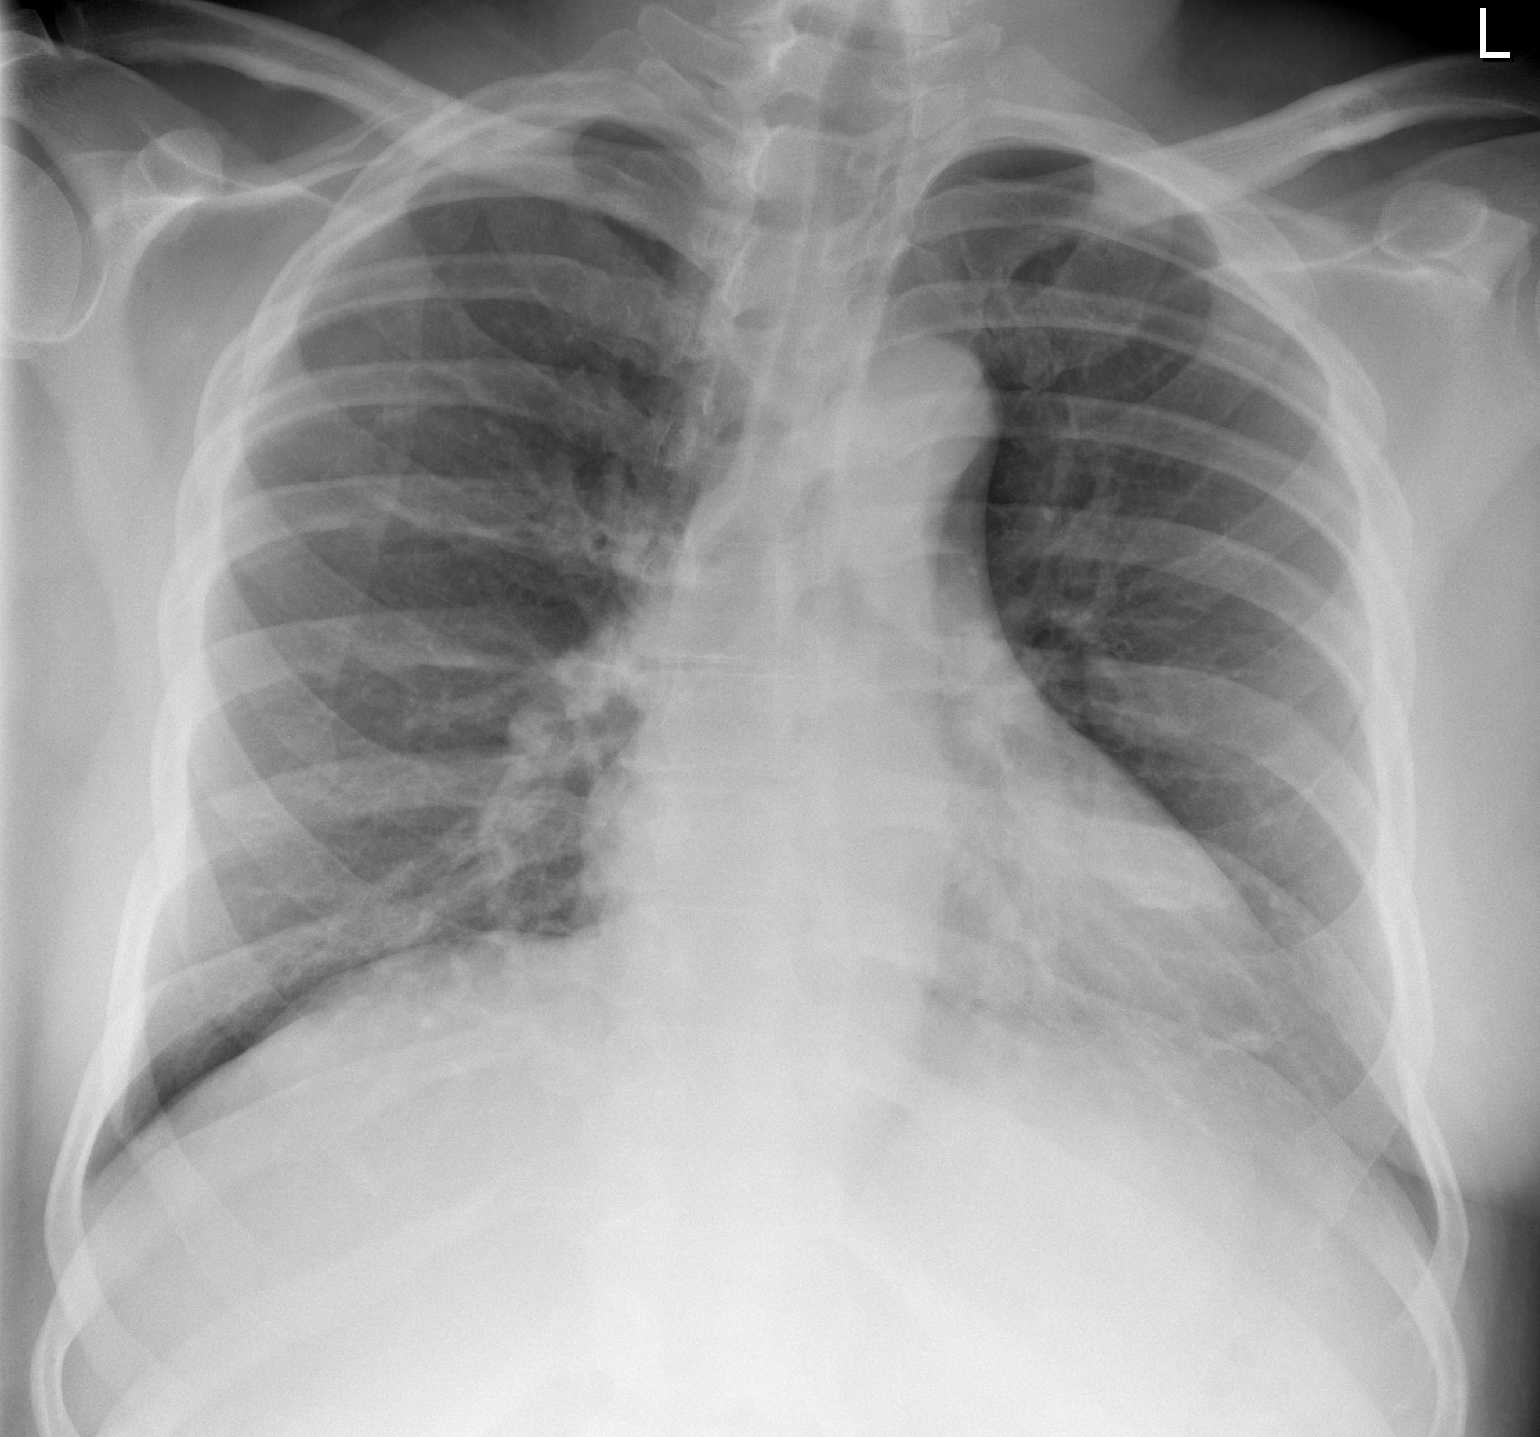

[w chest lat]
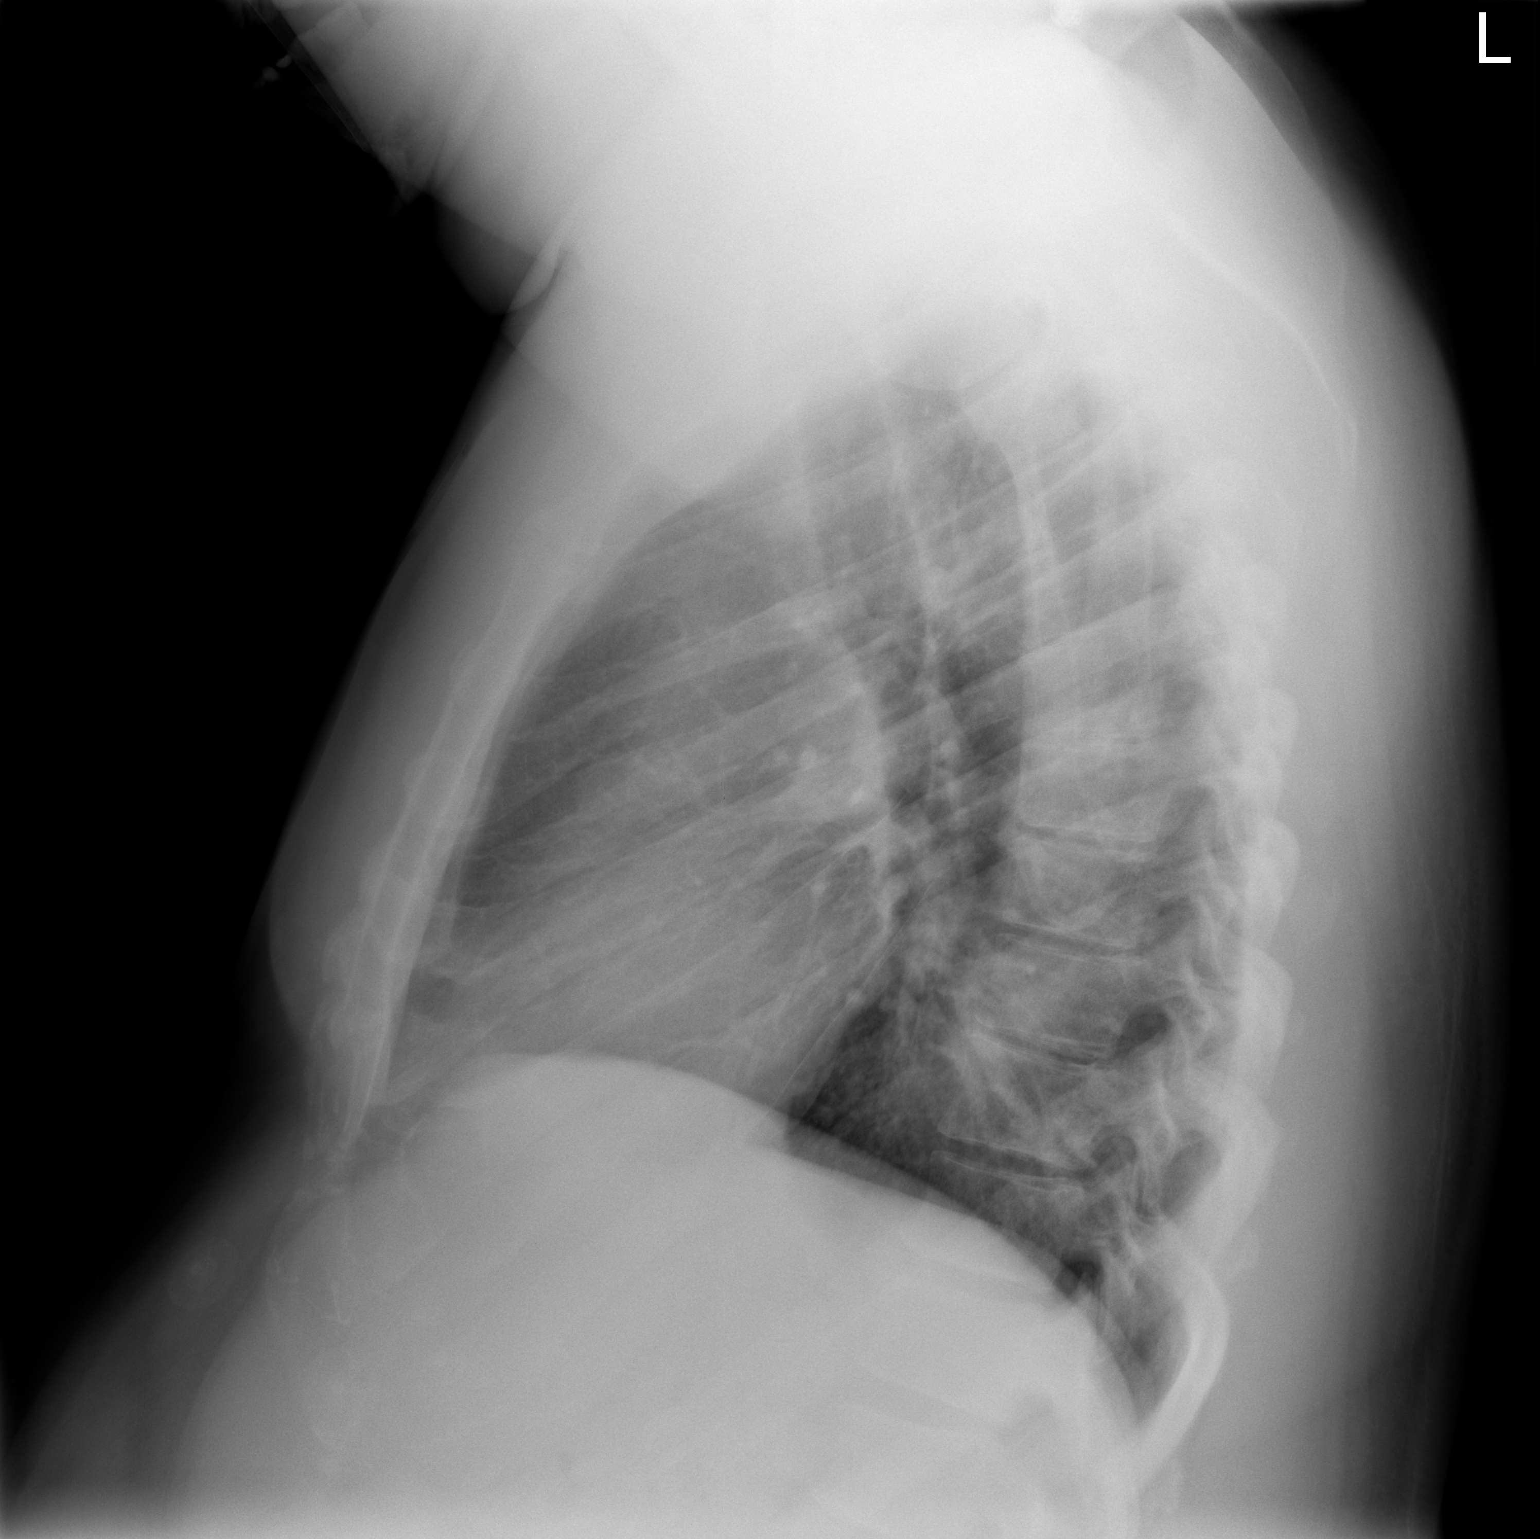

[2 of 2 positions shown; findings below may reference images not displayed]

FINDINGS: The cardiac silhouette remains borderline enlarged.
Clear lungs.  Unremarkable bones.
IMPRESSION: No acute abnormality.

## 2011-04-03 ENCOUNTER — Ambulatory Visit: Payer: Medicaid Other | Admitting: Physical Therapy

## 2011-04-03 NOTE — ED Provider Notes (Signed)
Medical screening examination/treatment/procedure(s) were performed by non-physician practitioner and as supervising physician I was immediately available for consultation/collaboration.   Mirna Mires, MD 04/03/11 [phone]

## 2011-04-06 ENCOUNTER — Other Ambulatory Visit: Payer: Self-pay

## 2011-04-06 ENCOUNTER — Encounter (HOSPITAL_COMMUNITY): Payer: Self-pay | Admitting: Emergency Medicine

## 2011-04-06 ENCOUNTER — Emergency Department (HOSPITAL_COMMUNITY): Payer: Medicaid Other

## 2011-04-06 ENCOUNTER — Emergency Department (HOSPITAL_COMMUNITY)
Admission: EM | Admit: 2011-04-06 | Discharge: 2011-04-06 | Payer: Medicaid Other | Attending: Emergency Medicine | Admitting: Emergency Medicine

## 2011-04-06 DIAGNOSIS — R112 Nausea with vomiting, unspecified: Secondary | ICD-10-CM | POA: Insufficient documentation

## 2011-04-06 DIAGNOSIS — R0789 Other chest pain: Secondary | ICD-10-CM | POA: Insufficient documentation

## 2011-04-06 DIAGNOSIS — R61 Generalized hyperhidrosis: Secondary | ICD-10-CM | POA: Insufficient documentation

## 2011-04-06 DIAGNOSIS — I10 Essential (primary) hypertension: Secondary | ICD-10-CM | POA: Insufficient documentation

## 2011-04-06 DIAGNOSIS — Z21 Asymptomatic human immunodeficiency virus [HIV] infection status: Secondary | ICD-10-CM | POA: Insufficient documentation

## 2011-04-06 DIAGNOSIS — E119 Type 2 diabetes mellitus without complications: Secondary | ICD-10-CM | POA: Insufficient documentation

## 2011-04-06 LAB — CBC
HCT: 38.9 % — ABNORMAL LOW (ref 39.0–52.0)
Hemoglobin: 13.4 g/dL (ref 13.0–17.0)
MCH: 27.8 pg (ref 26.0–34.0)
MCHC: 34.4 g/dL (ref 30.0–36.0)
MCV: 80.7 fL (ref 78.0–100.0)
Platelets: 213 10*3/uL (ref 150–400)
RBC: 4.82 MIL/uL (ref 4.22–5.81)
RDW: 13.7 % (ref 11.5–15.5)
WBC: 4.4 10*3/uL (ref 4.0–10.5)

## 2011-04-06 LAB — POCT I-STAT, CHEM 8
BUN: 14 mg/dL (ref 6–23)
Calcium, Ion: 1.22 mmol/L (ref 1.12–1.32)
Chloride: 103 mEq/L (ref 96–112)
Creatinine, Ser: 0.8 mg/dL (ref 0.50–1.35)
Glucose, Bld: 109 mg/dL — ABNORMAL HIGH (ref 70–99)
HCT: 42 % (ref 39.0–52.0)
Hemoglobin: 14.3 g/dL (ref 13.0–17.0)
Potassium: 3.8 mEq/L (ref 3.5–5.1)
Sodium: 141 mEq/L (ref 135–145)
TCO2: 27 mmol/L (ref 0–100)

## 2011-04-06 LAB — DIFFERENTIAL
Basophils Absolute: 0 10*3/uL (ref 0.0–0.1)
Basophils Relative: 0 % (ref 0–1)
Eosinophils Absolute: 0 10*3/uL (ref 0.0–0.7)
Eosinophils Relative: 1 % (ref 0–5)
Lymphocytes Relative: 40 % (ref 12–46)
Lymphs Abs: 1.8 10*3/uL (ref 0.7–4.0)
Monocytes Absolute: 0.5 10*3/uL (ref 0.1–1.0)
Monocytes Relative: 10 % (ref 3–12)
Neutro Abs: 2.2 10*3/uL (ref 1.7–7.7)
Neutrophils Relative %: 49 % (ref 43–77)

## 2011-04-06 LAB — D-DIMER, QUANTITATIVE: D-Dimer, Quant: <0.22 ug/mL-FEU (ref 0.00–0.48)

## 2011-04-06 LAB — GLUCOSE, CAPILLARY: Glucose-Capillary: 116 mg/dL — ABNORMAL HIGH (ref 70–99)

## 2011-04-06 LAB — TROPONIN I: Troponin I: <0.3 ng/mL (ref <0.30)

## 2011-04-06 MED ORDER — SODIUM CHLORIDE 0.9 % IV BOLUS (SEPSIS): 500.0000 mL | Freq: Once | INTRAVENOUS | Status: DC

## 2011-04-06 MED ORDER — KETOROLAC TROMETHAMINE 30 MG/ML IJ SOLN: 30.0000 mg | Freq: Once | INTRAMUSCULAR | Status: DC

## 2011-04-06 MED ORDER — KETOROLAC TROMETHAMINE 60 MG/2ML IM SOLN
60.0000 mg | Freq: Once | INTRAMUSCULAR | Status: AC
  Administered 2011-04-06: 60 mg via INTRAMUSCULAR
  Filled 2011-04-06: qty 2

## 2011-04-06 NOTE — Discharge Instructions (Signed)
Take over the counter ibuprofen for your pain.  Follow up with your doctor for further evaluation.  Return to ER if your symptoms worsen.    RICE: Routine Care for Injuries The routine care of many injuries includes Rest, Ice, Compression, and Elevation (RICE). HOME CARE INSTRUCTIONS  Rest is needed to allow your body to heal. Routine activities can usually be resumed when comfortable. Injured tendons and bones can take up to 6 weeks to heal. Tendons are the cord-like structures that attach muscle to bone.   Ice following an injury helps keep the swelling down and reduces pain.   Put ice in a plastic bag.   Place a towel between your skin and the bag.   Leave the ice on for 15 to 20 minutes, 3 to 4 times a day. Do this while awake, for the first 24 to 48 hours. After that, continue as directed by your caregiver.   Compression helps keep swelling down. It also gives support and helps with discomfort. If an elastic bandage has been applied, it should be removed and reapplied every 3 to 4 hours. It should not be applied tightly, but firmly enough to keep swelling down. Watch fingers or toes for swelling, bluish discoloration, coldness, numbness, or excessive pain. If any of these problems occur, remove the bandage and reapply loosely. Contact your caregiver if these problems continue.   Elevation helps reduce swelling and decreases pain. With extremities, such as the arms, hands, legs, and feet, the injured area should be placed near or above the level of the heart, if possible.  SEEK IMMEDIATE MEDICAL CARE IF:  You have persistent pain and swelling.   You develop redness, numbness, or unexpected weakness.   Your symptoms are getting worse rather than improving after several days.  These symptoms may indicate that further evaluation or further X-rays are needed. Sometimes, X-rays may not show a small broken bone (fracture) until 1 week or 10 days later. Make a follow-up appointment with your  caregiver. Ask when your X-ray results will be ready. Make sure you get your X-ray results. Document Released: 07/07/2000 Document Revised: 12/05/2010 Document Reviewed: 08/24/2010 [facility] Patient Information 2012 [place].

## 2011-04-06 NOTE — ED Notes (Signed)
Pt reports left sided chest pain onset last night with shortness of breath, N/V and diaphoresis.

## 2011-04-06 NOTE — ED Notes (Signed)
Patient transported to X-ray 

## 2011-04-06 NOTE — ED Provider Notes (Signed)
History    this is a 45 year old male with history of HIV presenting to the ED with chief complaints of chest pain. Patient states he has been experiencing pain to his left upper chest since last night. He described pain as sharp, constant, worsened with palpation or movement. He also noticed pleuritic chest pain, with some nausea and vomiting and he did break out in cold sweats. He denies fever, productive cough, abdominal pain, back pain, dysuria, rash, recent trauma. He denies leg swelling, or calf pain. He does not know his last CD4 count. He denies any recent travel, recent surgery. He denies history of heart disease.  CSN: [id number]  Arrival date & time 04/06/11  1502   First MD Initiated Contact with Patient 04/06/11 1626      Chief Complaint  Patient presents with  . Chest Pain    (Consider location/radiation/quality/duration/timing/severity/associated sxs/prior treatment) HPI  Past Medical History  Diagnosis Date  . HIV (human immunodeficiency virus infection)     CD4 count 100, VL 13800 (05/01/2010)  . Diabetes type 2, uncontrolled     HgA1c 17.6 (04/27/2010)  . Hypertension   . Genital warts   . Erectile dysfunction   . Chronic knee pain     right  . Osteomyelitis     h/o hand  . Diabetes mellitus     History reviewed. No pertinent past surgical history.  Family History  Problem Relation Age of Onset  . Hypertension Mother   . Arthritis Father   . Hypertension Father   . Hypertension Brother   . Cancer Maternal Grandmother 69    unknown type of cancer  . Depression Paternal Grandmother     History  Substance Use Topics  . Smoking status: Never Smoker   . Smokeless tobacco: Never Used  . Alcohol Use: No      Review of Systems  All other systems reviewed and are negative.    Allergies  Sulfa antibiotics  Home Medications   Current Outpatient Rx  Name Route Sig Dispense Refill  . ASPIRIN 81 MG PO TBEC Oral Take 81 mg by mouth daily.      .  ATENOLOL 50 MG PO TABS Oral Take 1 tablet (50 mg total) by mouth daily. 30 tablet 5  . DAPSONE 100 MG PO TABS Oral Take 1 tablet (100 mg total) by mouth daily. 30 tablet 5  . EMTRICITABINE-TENOFOVIR 200-300 MG PO TABS Oral Take 1 tablet by mouth daily. 30 tablet 5  . LISINOPRIL 40 MG PO TABS Oral Take 1 tablet (40 mg total) by mouth daily. 30 tablet 5  . MELOXICAM 15 MG PO TABS Oral Take 1 tablet (15 mg total) by mouth daily. 30 tablet 1  . METFORMIN HCL 1000 MG PO TABS Oral Take 1 tablet (1,000 mg total) by mouth 2 (two) times daily. 60 tablet 5  . RALTEGRAVIR POTASSIUM 400 MG PO TABS Oral Take 1 tablet (400 mg total) by mouth 2 (two) times daily. 60 tablet 5  . TESTOSTERONE 50 MG/5GM TD GEL Transdermal Place onto the skin daily. Apply one packet every day     . ZIDOVUDINE 300 MG PO TABS Oral Take 1 tablet (300 mg total) by mouth 2 (two) times daily. 60 tablet 5    BP 113/85  Pulse 107  Temp(Src) 98.1 F (36.7 C) (Oral)  Resp 18  SpO2 100%  Physical Exam  Nursing note and vitals reviewed. Constitutional:       Awake, alert, nontoxic appearance  HENT:  Head: Atraumatic.  Eyes: Right eye exhibits no discharge. Left eye exhibits no discharge.  Neck: Neck supple.  Cardiovascular: Normal rate and regular rhythm.  Exam reveals no gallop and no friction rub.   No murmur heard. Pulmonary/Chest: Effort normal. No respiratory distress. He has no wheezes. He has no rales. He exhibits no tenderness.  Abdominal: Soft. There is no tenderness. There is no rebound.  Musculoskeletal: He exhibits no tenderness.       Baseline ROM, no obvious new focal weakness  Neurological: He is alert.       Mental status and motor strength appears baseline for patient and situation  Skin: No rash noted.  Psychiatric: He has a normal mood and affect.    ED Course  Procedures (including critical care time)   Labs Reviewed  POCT CBG MONITORING   No results found.   No diagnosis found.    MDM    Pleuritic chest pain in the setting of HIV. I will obtain a chest x-ray to rule out infection. Low suspicion for cardiac etiology. I will obtain an EKG and one set of cardiac marker.  A d-dimer was ordered to rule out PE.  6:11 PM Patient has chest pain that is reproducible, likely musculoskeletal in origin. He has normal cardiac enzymes, normal d-dimer, normal CBC and i-STAT, and normal chest x-ray. His pain has improved with Toradol.   1:19 PM Katy Apo, M.D.    Domenic Moras, PA 04/06/11 McLeod III, MD 04/07/11 [phone]

## 2011-04-12 ENCOUNTER — Emergency Department (HOSPITAL_COMMUNITY)
Admission: EM | Admit: 2011-04-12 | Discharge: 2011-04-12 | Disposition: A | Discharge status: Home / Self Care | Payer: Medicaid Other | Attending: Emergency Medicine | Admitting: Emergency Medicine

## 2011-04-12 ENCOUNTER — Encounter (HOSPITAL_COMMUNITY): Payer: Self-pay | Admitting: Emergency Medicine

## 2011-04-12 DIAGNOSIS — Z21 Asymptomatic human immunodeficiency virus [HIV] infection status: Secondary | ICD-10-CM | POA: Insufficient documentation

## 2011-04-12 DIAGNOSIS — M79605 Pain in left leg: Secondary | ICD-10-CM

## 2011-04-12 DIAGNOSIS — Z7982 Long term (current) use of aspirin: Secondary | ICD-10-CM | POA: Insufficient documentation

## 2011-04-12 DIAGNOSIS — I10 Essential (primary) hypertension: Secondary | ICD-10-CM | POA: Insufficient documentation

## 2011-04-12 DIAGNOSIS — M79609 Pain in unspecified limb: Secondary | ICD-10-CM | POA: Insufficient documentation

## 2011-04-12 DIAGNOSIS — E119 Type 2 diabetes mellitus without complications: Secondary | ICD-10-CM | POA: Insufficient documentation

## 2011-04-12 DIAGNOSIS — Z79899 Other long term (current) drug therapy: Secondary | ICD-10-CM | POA: Insufficient documentation

## 2011-04-12 DIAGNOSIS — G8929 Other chronic pain: Secondary | ICD-10-CM | POA: Insufficient documentation

## 2011-04-12 DIAGNOSIS — M79604 Pain in right leg: Secondary | ICD-10-CM

## 2011-04-12 HISTORY — DX: Other chronic pain: G89.29

## 2011-04-12 MED ORDER — MELOXICAM 7.5 MG PO TABS: 15.0000 mg | ORAL_TABLET | Freq: Every day | ORAL | Status: DC

## 2011-04-12 NOTE — ED Notes (Signed)
PT. REPORTS BILATERAL LEG PAIN FOR 2 MONTHS , DENIES INJURY - AMBULATORY.

## 2011-04-12 NOTE — ED Provider Notes (Signed)
History     CSN: [id number]  Arrival date & time 04/12/11  [id number]   First MD Initiated Contact with Patient 04/12/11 0750      Chief Complaint  Patient presents with  . Leg Pain    (Consider location/radiation/quality/duration/timing/severity/associated sxs/prior treatment) HPI  46 year old male with history of HIV presenting ED with chronic bilateral leg pain. Pain is described as throbbing sensation affecting both thighs and radiating down to lower extremities. Pain is worsened with sitting and improves with walking around. Pain is been ongoing for several months. Patient was seen and evaluated by outpatient clinic for the symptoms. He was given meloxicam and therapy session. Patient states he has ran out of his meloxicam and currently in no longer attending therapy session. He does recall that and his medication has helped. He denies fever, rash, or joint pain. He denies redness or swelling. He denies any recent trauma. Patient denies taking statin drugs. He denies numbness or weakness.  Past Medical History  Diagnosis Date  . HIV (human immunodeficiency virus infection)     CD4 count 100, VL 13800 (05/01/2010)  . Diabetes type 2, uncontrolled     HgA1c 17.6 (04/27/2010)  . Hypertension   . Genital warts   . Erectile dysfunction   . Chronic knee pain     right  . Osteomyelitis     h/o hand  . Diabetes mellitus   . Chronic pain     History reviewed. No pertinent past surgical history.  Family History  Problem Relation Age of Onset  . Hypertension Mother   . Arthritis Father   . Hypertension Father   . Hypertension Brother   . Cancer Maternal Grandmother 81    unknown type of cancer  . Depression Paternal Grandmother     History  Substance Use Topics  . Smoking status: Never Smoker   . Smokeless tobacco: Never Used  . Alcohol Use: No      Review of Systems  All other systems reviewed and are negative.    Allergies  Sulfa antibiotics  Home Medications    Current Outpatient Rx  Name Route Sig Dispense Refill  . ASPIRIN 81 MG PO TBEC Oral Take 81 mg by mouth daily.      . ATENOLOL 50 MG PO TABS Oral Take 1 tablet (50 mg total) by mouth daily. 30 tablet 5  . DAPSONE 100 MG PO TABS Oral Take 1 tablet (100 mg total) by mouth daily. 30 tablet 5  . EMTRICITABINE-TENOFOVIR 200-300 MG PO TABS Oral Take 1 tablet by mouth daily. 30 tablet 5  . LISINOPRIL 40 MG PO TABS Oral Take 1 tablet (40 mg total) by mouth daily. 30 tablet 5  . MELOXICAM 15 MG PO TABS Oral Take 1 tablet (15 mg total) by mouth daily. 30 tablet 1  . METFORMIN HCL 1000 MG PO TABS Oral Take 1 tablet (1,000 mg total) by mouth 2 (two) times daily. 60 tablet 5  . RALTEGRAVIR POTASSIUM 400 MG PO TABS Oral Take 1 tablet (400 mg total) by mouth 2 (two) times daily. 60 tablet 5  . TESTOSTERONE 50 MG/5GM TD GEL Transdermal Place onto the skin daily. Apply one packet every day     . ZIDOVUDINE 300 MG PO TABS Oral Take 1 tablet (300 mg total) by mouth 2 (two) times daily. 60 tablet 5    BP 124/93  Pulse 107  Temp 97.8 F (36.6 C)  Resp 18  SpO2 98%  Physical Exam  Nursing note  and vitals reviewed. Constitutional:       Awake, alert, nontoxic appearance  HENT:  Head: Atraumatic.  Eyes: Right eye exhibits no discharge. Left eye exhibits no discharge.  Neck: Neck supple.  Pulmonary/Chest: Effort normal. He exhibits no tenderness.  Abdominal: There is no tenderness. There is no rebound.  Musculoskeletal: Normal range of motion. He exhibits tenderness. He exhibits no edema.       Right hip: Normal.       Left hip: Normal.       Right knee: Normal.       Left knee: Normal.       Right upper leg: He exhibits tenderness. He exhibits no bony tenderness, no swelling and no edema.       Left upper leg: He exhibits tenderness. He exhibits no bony tenderness, no swelling, no edema and no deformity.       Right lower leg: Normal.       Left lower leg: Normal.       Baseline ROM, no obvious  new focal weakness  Neurological:       Mental status and motor strength appears baseline for patient and situation  Skin: No rash noted.  Psychiatric: He has a normal mood and affect.    ED Course  Procedures (including critical care time)  Labs Reviewed - No data to display No results found.   No diagnosis found.    MDM  Patient with chronic lower extremities pain. He has no fever, rash, or joint pain concerning for infection. He is currently not taking any medication concerning for rhabdomyolysis. He does follow up with outpatient clinic, which I encouraged the patient to return for reevaluation.  I will refill his meloxicam in the meantime. Patient is currently in no acute distress and able to ambulate without difficulty. The patella reflex is intact bilaterally.        Domenic Moras, [place] 04/12/11 [phone]

## 2011-04-12 NOTE — ED Provider Notes (Signed)
Medical screening examination/treatment/procedure(s) were performed by non-physician practitioner and as supervising physician I was immediately available for consultation/collaboration. Bonny Egger,Jurrell Y.   Saddie Benders. Aayliah Rotenberry, MD 04/12/11 1626

## 2011-04-15 ENCOUNTER — Telehealth: Payer: Self-pay | Admitting: *Deleted

## 2011-04-15 ENCOUNTER — Ambulatory Visit: Payer: Medicaid Other | Admitting: Infectious Diseases

## 2011-04-15 NOTE — Telephone Encounter (Signed)
He did not keep his appt today. I called both numbers. One is d/c & the other one does not take incoming calls

## 2011-04-20 ENCOUNTER — Emergency Department (HOSPITAL_COMMUNITY)
Admission: EM | Admit: 2011-04-20 | Discharge: 2011-04-20 | Disposition: A | Discharge status: Home / Self Care | Payer: Medicaid Other | Attending: Emergency Medicine | Admitting: Emergency Medicine

## 2011-04-20 DIAGNOSIS — G8929 Other chronic pain: Secondary | ICD-10-CM | POA: Insufficient documentation

## 2011-04-20 DIAGNOSIS — M79609 Pain in unspecified limb: Secondary | ICD-10-CM | POA: Insufficient documentation

## 2011-04-20 DIAGNOSIS — E119 Type 2 diabetes mellitus without complications: Secondary | ICD-10-CM | POA: Insufficient documentation

## 2011-04-20 DIAGNOSIS — Z21 Asymptomatic human immunodeficiency virus [HIV] infection status: Secondary | ICD-10-CM | POA: Insufficient documentation

## 2011-04-20 DIAGNOSIS — I10 Essential (primary) hypertension: Secondary | ICD-10-CM | POA: Insufficient documentation

## 2011-04-20 DIAGNOSIS — R748 Abnormal levels of other serum enzymes: Secondary | ICD-10-CM

## 2011-04-20 LAB — BASIC METABOLIC PANEL
BUN: 9 mg/dL (ref 6–23)
CO2: 26 mEq/L (ref 19–32)
Calcium: 10.2 mg/dL (ref 8.4–10.5)
Chloride: 103 mEq/L (ref 96–112)
Creatinine, Ser: 0.67 mg/dL (ref 0.50–1.35)
GFR calc Af Amer: >90 mL/min (ref >90)
GFR calc non Af Amer: >90 mL/min (ref >90)
Glucose, Bld: 162 mg/dL — ABNORMAL HIGH (ref 70–99)
Potassium: 3.9 mEq/L (ref 3.5–5.1)
Sodium: 139 mEq/L (ref 135–145)

## 2011-04-20 LAB — CK: Total CK: 239 U/L — ABNORMAL HIGH (ref 7–232)

## 2011-04-20 MED ORDER — OXYCODONE-ACETAMINOPHEN 5-325 MG PO TABS: 1.0000 | ORAL_TABLET | ORAL | Status: AC | PRN

## 2011-04-20 MED ORDER — OXYCODONE-ACETAMINOPHEN 5-325 MG PO TABS
1.0000 | ORAL_TABLET | Freq: Once | ORAL | Status: AC
  Administered 2011-04-20: 1 via ORAL
  Filled 2011-04-20: qty 1

## 2011-04-20 NOTE — ED Provider Notes (Signed)
History     CSN: [id number]  Arrival date & time 04/20/11  [id number]   First MD Initiated Contact with Patient 04/20/11 [phone]      Chief Complaint  Patient presents with  . Leg Pain    chronic    (Consider location/radiation/quality/duration/timing/severity/associated sxs/prior treatment) HPI 45yom h/o HIV on HAART therapy, DM, chronic pain pw bilateral lower extremity pain. She states that the pain is currently 10 out of 10. He describes as a dull ache diffusely there is anterior posterior lower extremity. He states the pain has been present every day for approximately 4 months. It is better with walking and pacing. It is also better with Percocet but he ran out of his Percocet. He denies new numbness, tingling, weakness in his extremities. He denies fevers, chills. Denies h/o VTE in self or family. No recent hosp/surg/immob. No h/o cancer. Denies exogenous hormone use, no leg pain or swelling. No new change in medications.  Chart review reveals pt here frequently with chronic knee pain.     ED Notes, ED Provider Notes from 04/20/11 0000 to 04/20/11 06:48:32       Renee Ramus 04/20/2011 06:47      Pt has been experiencing sharp pains, shooting from his thighs to his feet for several months, ever since he lost 71 lbs. Last night the pain became unbearable. Pain decreases when pt walks. Denies any injury.     Past Medical History  Diagnosis Date  . HIV (human immunodeficiency virus infection)     CD4 count 100, VL 13800 (05/01/2010)  . Diabetes type 2, uncontrolled     HgA1c 17.6 (04/27/2010)  . Hypertension   . Genital warts   . Erectile dysfunction   . Chronic knee pain     right  . Osteomyelitis     h/o hand  . Diabetes mellitus   . Chronic pain     No past surgical history on file.  Family History  Problem Relation Age of Onset  . Hypertension Mother   . Arthritis Father   . Hypertension Father   . Hypertension Brother   . Cancer Maternal Grandmother 75   unknown type of cancer  . Depression Paternal Grandmother     History  Substance Use Topics  . Smoking status: Never Smoker   . Smokeless tobacco: Never Used  . Alcohol Use: No      Review of Systems  All other systems reviewed and are negative.   except as noted HPI   Allergies  Sulfa antibiotics  Home Medications   Current Outpatient Rx  Name Route Sig Dispense Refill  . ASPIRIN 81 MG PO TBEC Oral Take 81 mg by mouth daily.      . ATENOLOL 50 MG PO TABS Oral Take 1 tablet (50 mg total) by mouth daily. 30 tablet 5  . DAPSONE 100 MG PO TABS Oral Take 1 tablet (100 mg total) by mouth daily. 30 tablet 5  . EMTRICITABINE-TENOFOVIR 200-300 MG PO TABS Oral Take 1 tablet by mouth daily. 30 tablet 5  . LISINOPRIL 40 MG PO TABS Oral Take 1 tablet (40 mg total) by mouth daily. 30 tablet 5  . MELOXICAM 7.5 MG PO TABS Oral Take 2 tablets (15 mg total) by mouth daily. 30 tablet 0  . METFORMIN HCL 1000 MG PO TABS Oral Take 1 tablet (1,000 mg total) by mouth 2 (two) times daily. 60 tablet 5  . RALTEGRAVIR POTASSIUM 400 MG PO TABS Oral Take 1 tablet (400  mg total) by mouth 2 (two) times daily. 60 tablet 5  . TESTOSTERONE 50 MG/5GM TD GEL Transdermal Place onto the skin daily. Apply one packet every day     . ZIDOVUDINE 300 MG PO TABS Oral Take 1 tablet (300 mg total) by mouth 2 (two) times daily. 60 tablet 5  . OXYCODONE-ACETAMINOPHEN 5-325 MG PO TABS Oral Take 1 tablet by mouth every 4 (four) hours as needed for pain. 20 tablet 0    BP 130/93  Pulse 83  Temp(Src) 98 F (36.7 C) (Oral)  Resp 18  SpO2 99%  Physical Exam  Nursing note and vitals reviewed. Constitutional: He is oriented to person, place, and time. He appears well-developed and well-nourished. No distress.  HENT:  Head: Atraumatic.  Mouth/Throat: Oropharynx is clear and moist.  Eyes: Conjunctivae are normal. Pupils are equal, round, and reactive to light.  Neck: Neck supple.  Cardiovascular: Normal rate, regular  rhythm, normal heart sounds and intact distal pulses.  Exam reveals no gallop and no friction rub.   No murmur heard. Pulmonary/Chest: Effort normal. No respiratory distress. He has no wheezes. He has no rales.  Abdominal: Soft. Bowel sounds are normal. There is no tenderness. There is no rebound and no guarding.  Musculoskeletal: Normal range of motion. He exhibits no edema and no tenderness.       Strength 5/5 b/l LE DP/PT intact No ttp  Neurological: He is alert and oriented to person, place, and time.  Skin: Skin is warm and dry.  Psychiatric: He has a normal mood and affect.    ED Course  Procedures (including critical care time)   Labs Reviewed  BASIC METABOLIC PANEL  CK   No results found.   1. Chronic leg pain      MDM  Likely exacerbation of chronic pain 2/2 running out of his percocet. Unlikely breakdown of muscle, however, pt is taking truvada, retrovir which puts him at risk for myopathy and rhabdomyolosis. Will check electrolytes and CK. Percocet. Anticipate discharge home with PMD f/u-- has appt for 1/30.         [name], MD 04/22/11 [phone]

## 2011-04-20 NOTE — ED Notes (Signed)
Vital signs rechecked by Verdis Frederickson, [place]

## 2011-04-20 NOTE — ED Notes (Signed)
Pt has been experiencing sharp pains, shooting from his thighs to his feet for several months, ever since he lost 71 lbs.  Last night the pain became unbearable.  Pain decreases when pt walks.  Denies any injury.

## 2011-04-20 NOTE — ED Notes (Signed)
Pt waiting for bus pass. Dept doesn't have any at this time. Obtaining some from social work. Pt waiting in chair outside room for pass

## 2011-04-23 ENCOUNTER — Encounter: Payer: Self-pay | Admitting: Infectious Diseases

## 2011-04-23 ENCOUNTER — Ambulatory Visit (INDEPENDENT_AMBULATORY_CARE_PROVIDER_SITE_OTHER): Payer: Medicaid Other | Admitting: Infectious Diseases

## 2011-04-23 DIAGNOSIS — B20 Human immunodeficiency virus [HIV] disease: Secondary | ICD-10-CM

## 2011-04-23 DIAGNOSIS — Z113 Encounter for screening for infections with a predominantly sexual mode of transmission: Secondary | ICD-10-CM

## 2011-04-23 DIAGNOSIS — Z23 Encounter for immunization: Secondary | ICD-10-CM

## 2011-04-23 DIAGNOSIS — IMO0002 Reserved for concepts with insufficient information to code with codable children: Secondary | ICD-10-CM

## 2011-04-23 DIAGNOSIS — E1165 Type 2 diabetes mellitus with hyperglycemia: Secondary | ICD-10-CM

## 2011-04-23 DIAGNOSIS — Z79899 Other long term (current) drug therapy: Secondary | ICD-10-CM

## 2011-04-23 DIAGNOSIS — IMO0001 Reserved for inherently not codable concepts without codable children: Secondary | ICD-10-CM

## 2011-04-23 DIAGNOSIS — G5710 Meralgia paresthetica, unspecified lower limb: Secondary | ICD-10-CM

## 2011-04-23 MED ORDER — RALTEGRAVIR POTASSIUM 400 MG PO TABS: 400.0000 mg | ORAL_TABLET | Freq: Two times a day (BID) | ORAL | Status: DC

## 2011-04-23 MED ORDER — ZIDOVUDINE 300 MG PO TABS: 300.0000 mg | ORAL_TABLET | Freq: Two times a day (BID) | ORAL | Status: DC

## 2011-04-23 MED ORDER — EMTRICITABINE-TENOFOVIR DF 200-300 MG PO TABS: 1.0000 | ORAL_TABLET | Freq: Every day | ORAL | Status: DC

## 2011-04-23 MED ORDER — OXYCODONE-ACETAMINOPHEN 5-325 MG PO TABS: 1.0000 | ORAL_TABLET | Freq: Four times a day (QID) | ORAL | Status: DC | PRN

## 2011-04-23 MED ORDER — DAPSONE 100 MG PO TABS: 100.0000 mg | ORAL_TABLET | Freq: Every day | ORAL | Status: DC

## 2011-04-23 NOTE — Assessment & Plan Note (Signed)
He is given condoms. He had labs in December but still has detectable VL. Will get flu, needs pnvx in June 2013. Has not taken art for last month, ran out. Will refill. rtc 3 months.

## 2011-04-23 NOTE — Assessment & Plan Note (Signed)
Improving with wt loss, greatly appreciate IM partnering with [place].

## 2011-04-23 NOTE — Progress Notes (Signed)
  Subjective:    [name], male    DOB: [birth year], 46 y.o.   MRN: [name]  HPI 46 yo M with obesity and now wit 70# wt loss (has been exercising, watching diet). His DM2 has improved and has been able to come off insulin. He has HIV as well, last CD4 80 and VL 225 (03-23-11). Previously noted to have difficulties adherence, last med ISN/TRV/AZT.   Seen in ED last week with neuropathy, was given percocet. Last FSG 116. Has IM appt 05-09-11.  Review of Systems  Constitutional: Positive for unexpected weight change. Negative for appetite change.  Eyes: Positive for visual disturbance.       Vision blurry  Gastrointestinal: Negative for diarrhea and constipation.  Genitourinary: Negative for dysuria.  Musculoskeletal: Positive for myalgias.       Objective:   Physical Exam  Constitutional: He appears well-developed and well-nourished. No distress.  HENT:  Head: Normocephalic.  Mouth/Throat: No oropharyngeal exudate.  Eyes: Pupils are equal, round, and reactive to light. No scleral icterus.  Cardiovascular: Normal rate, regular rhythm and normal heart sounds.   Pulmonary/Chest: Breath sounds normal.  Abdominal: Soft. Bowel sounds are normal. There is no tenderness.  Musculoskeletal: He exhibits tenderness. He exhibits no edema.       Legs:         Assessment & Plan:

## 2011-04-23 NOTE — Assessment & Plan Note (Signed)
Suspect the dramatic wt loss may be the cause for his pain in his thighs. ?nerve entrapment/impingement due to this. Will refill his pain rx until he sees IM, told that this is not a long term rx.

## 2011-04-24 ENCOUNTER — Telehealth: Payer: Self-pay | Admitting: Dietician

## 2011-04-24 NOTE — Telephone Encounter (Signed)
Patient called to make na appointment with CDE and his doctor. Transferred his call to front office to assist him in making these appointments

## 2011-04-30 ENCOUNTER — Telehealth: Payer: Self-pay | Admitting: Licensed Clinical Social Worker

## 2011-04-30 NOTE — Telephone Encounter (Signed)
I called the patient to tell him we could not fill his medication and he would need to see internal medicine about his unbearable pain in his legs. He agreed

## 2011-04-30 NOTE — Telephone Encounter (Signed)
Patient wants another refill of pain medication that was given on 04/23/2011 at an office visit due to bilateral leg pain. In the office note Dr. [name] states that he would fill it to get him by until his office visit with Internal Medicine. His office visit is not until 215/2013 and he wants another refill. Please advise

## 2011-05-01 ENCOUNTER — Ambulatory Visit (INDEPENDENT_AMBULATORY_CARE_PROVIDER_SITE_OTHER): Payer: Medicaid Other | Admitting: Internal Medicine

## 2011-05-01 ENCOUNTER — Encounter (HOSPITAL_COMMUNITY): Payer: Self-pay | Admitting: Emergency Medicine

## 2011-05-01 ENCOUNTER — Encounter: Payer: Self-pay | Admitting: Internal Medicine

## 2011-05-01 ENCOUNTER — Ambulatory Visit: Payer: Medicaid Other | Admitting: Internal Medicine

## 2011-05-01 ENCOUNTER — Emergency Department (HOSPITAL_COMMUNITY)
Admission: EM | Admit: 2011-05-01 | Discharge: 2011-05-01 | Disposition: A | Discharge status: Home / Self Care | Payer: Medicaid Other | Attending: Emergency Medicine | Admitting: Emergency Medicine

## 2011-05-01 DIAGNOSIS — M629 Disorder of muscle, unspecified: Secondary | ICD-10-CM

## 2011-05-01 DIAGNOSIS — E119 Type 2 diabetes mellitus without complications: Secondary | ICD-10-CM

## 2011-05-01 DIAGNOSIS — Z79899 Other long term (current) drug therapy: Secondary | ICD-10-CM | POA: Insufficient documentation

## 2011-05-01 DIAGNOSIS — B20 Human immunodeficiency virus [HIV] disease: Secondary | ICD-10-CM

## 2011-05-01 DIAGNOSIS — M7630 Iliotibial band syndrome, unspecified leg: Secondary | ICD-10-CM

## 2011-05-01 DIAGNOSIS — Z7982 Long term (current) use of aspirin: Secondary | ICD-10-CM | POA: Insufficient documentation

## 2011-05-01 DIAGNOSIS — M79609 Pain in unspecified limb: Secondary | ICD-10-CM | POA: Insufficient documentation

## 2011-05-01 DIAGNOSIS — M79606 Pain in leg, unspecified: Secondary | ICD-10-CM

## 2011-05-01 DIAGNOSIS — I10 Essential (primary) hypertension: Secondary | ICD-10-CM | POA: Insufficient documentation

## 2011-05-01 DIAGNOSIS — Z21 Asymptomatic human immunodeficiency virus [HIV] infection status: Secondary | ICD-10-CM

## 2011-05-01 LAB — GLUCOSE, CAPILLARY
Glucose-Capillary: 112 mg/dL — ABNORMAL HIGH (ref 70–99)
Glucose-Capillary: 142 mg/dL — ABNORMAL HIGH (ref 70–99)

## 2011-05-01 MED ORDER — OXYCODONE-ACETAMINOPHEN 5-325 MG PO TABS: 1.0000 | ORAL_TABLET | ORAL | Status: DC | PRN

## 2011-05-01 MED ORDER — OXYCODONE-ACETAMINOPHEN 5-325 MG PO TABS
1.0000 | ORAL_TABLET | Freq: Once | ORAL | Status: AC
  Administered 2011-05-01: 1 via ORAL
  Filled 2011-05-01: qty 1

## 2011-05-01 MED ORDER — OXYCODONE-ACETAMINOPHEN 5-325 MG PO TABS: 1.0000 | ORAL_TABLET | Freq: Three times a day (TID) | ORAL | Status: AC | PRN

## 2011-05-01 NOTE — Progress Notes (Signed)
Subjective:   [name] male   DOB: [birth year] 46 y.o.   MRN: [name]  HPI: Keith [name] is a 46 y.o. man that presents for acute visit regarding b/l LE pain that starts in thighs (reports medial, but shows me lateral).  He was seen for this in clinic on 03/13/11.  Since that clinic visit, he has been to the ED 6 times for this problem and has gotten percocet, most recently on 05/01/11 at 4:54 am.  He describes pain as achy, better with percocet, warm bath and walking.  Pain is worse in cold.  He tells me pain started 01/2011 after he started exercising a lot more because of poor DM control.  He has had 40lb weight loss since 08/2010 by exercise and diet change.  He has gone from HbA1c >20, to 9 most recently.  He notes occasional shooting pain sensation, but feels achy mostly.  No urinary incontinence.  One episode of fecal incontinence 1 month ago after eating hot dog (he thought he was going to pass gas, but had a loose BM instead).  No back pain. He does complain of occasional b/l feet pain/burning.   Past Medical History  Diagnosis Date  . HIV (human immunodeficiency virus infection)     CD4 count 100, VL 13800 (05/01/2010)  . Diabetes type 2, uncontrolled     HgA1c 17.6 (04/27/2010)  . Hypertension   . Genital warts   . Erectile dysfunction   . Chronic knee pain     right  . Osteomyelitis     h/o hand  . Diabetes mellitus   . Chronic pain    Current Outpatient Prescriptions  Medication Sig Dispense Refill  . aspirin 81 MG EC tablet Take 81 mg by mouth daily.        Marland Kitchen atenolol (TENORMIN) 50 MG tablet Take 1 tablet (50 mg total) by mouth daily.  30 tablet  5  . dapsone 100 MG tablet Take 1 tablet (100 mg total) by mouth daily.  30 tablet  5  . emtricitabine-tenofovir (TRUVADA) 200-300 MG per tablet Take 1 tablet by mouth daily.  30 tablet  5  . lisinopril (PRINIVIL,ZESTRIL) 40 MG tablet Take 1 tablet (40 mg total) by mouth daily.  30 tablet  5  . metFORMIN  (GLUCOPHAGE) 1000 MG tablet Take 1 tablet (1,000 mg total) by mouth 2 (two) times daily.  60 tablet  5  . oxyCODONE-acetaminophen (PERCOCET) 5-325 MG per tablet Take 1 tablet by mouth every 8 (eight) hours as needed for pain.  9 tablet  0  . raltegravir (ISENTRESS) 400 MG tablet Take 1 tablet (400 mg total) by mouth 2 (two) times daily.  60 tablet  5  . testosterone (ANDROGEL) 50 MG/5GM GEL Place onto the skin daily. Apply one packet every day       . zidovudine (RETROVIR) 300 MG tablet Take 1 tablet (300 mg total) by mouth 2 (two) times daily.  60 tablet  5  . oxyCODONE-acetaminophen (PERCOCET) 5-325 MG per tablet Take 1 tablet by mouth every 4 (four) hours as needed for pain.  20 tablet  0   No current facility-administered medications for this visit.   Facility-Administered Medications Ordered in Other Visits  Medication Dose Route Frequency Provider Last Rate Last Dose  . oxyCODONE-acetaminophen (PERCOCET) 5-325 MG per tablet 1 tablet  1 tablet Oral Once Delora Fuel, MD   1 tablet at 05/01/11 0459   Social History Main Topics  .  Smoking status: Never Smoker   . Smokeless tobacco: Never Used  . Alcohol Use: No  . Drug Use: No  . Sexually Active: Yes -- Male partner(s)     given condoms   Social History Narrative   Unemployed. Applying for disability.   Review of Systems: General: no fevers, chills Skin: no rash HEENT: no blurry vision, hearing changes, sore throat Pulm: no dyspnea, coughing, wheezing CV: no chest pain, palpitations, shortness of breath Abd: no abdominal pain, nausea/vomiting, diarrhea/constipation GU: no dysuria, hematuria, polyuria Ext: as per HPI Neuro: no weakness or numbness  Objective:  Physical Exam: Filed Vitals:   05/01/11 1528  BP: 122/85  Pulse: 116  Temp: 98.5 F (36.9 C)  TempSrc: Oral  Weight: 244 lb 1.6 oz (110.723 kg)   Constitutional: No acute distress, cooperative to exam.  Cardiovascular: tachycardic, S1 normal, S2 normal, no MRG,  pulses symmetric and intact bilaterally Pulmonary/Chest: CTAB, no wheezes, rales, or rhonchi Abdominal: Soft. Non-tender, non-distended, bowel sounds are normal Musculoskeletal: No joint deformities or erythema, unable to flex hip more than 2-3 inches for straight leg test, pain with knee flexion (active and passive, and thus unable to perform maneuvers to evaluate knee) when laying flat, but able to let knees hang 90 degrees off exam table while sitting up, unable to reach for toes while standing without significant pain, back not TTP, b/l anterior shin mildly TTP Neurological: A&O x3   Assessment & Plan:  Case and care discussed with Dr. Stann Mainland. Patient to return in Feb for DM check and to meet PCP.  He will go to orthopedic appt on Friday May 03, 2011.  Please see problem oriented charting for further details.

## 2011-05-01 NOTE — ED Provider Notes (Signed)
History     CSN: [id number]  Arrival date & time 05/01/11  0421   First MD Initiated Contact with Patient 05/01/11 [phone]      Chief Complaint  Patient presents with  . Leg Pain    (Consider location/radiation/quality/duration/timing/severity/associated sxs/prior treatment) Patient is a 46 y.o. male presenting with leg pain. The history is provided by the patient.  Leg Pain   He is being treated for HIV and started having leg pain after having a 70 pound weight loss. He is now having chronic upper leg pain which is only controlled with Percocet. He has run out of his Percocet and his next appointment with his primary care provider is February 13. Pain is severe and worse with movement and worse with palpation.  Past Medical History  Diagnosis Date  . HIV (human immunodeficiency virus infection)     CD4 count 100, VL 13800 (05/01/2010)  . Diabetes type 2, uncontrolled     HgA1c 17.6 (04/27/2010)  . Hypertension   . Genital warts   . Erectile dysfunction   . Chronic knee pain     right  . Osteomyelitis     h/o hand  . Diabetes mellitus   . Chronic pain     History reviewed. No pertinent past surgical history.  Family History  Problem Relation Age of Onset  . Hypertension Mother   . Arthritis Father   . Hypertension Father   . Hypertension Brother   . Cancer Maternal Grandmother 64    unknown type of cancer  . Depression Paternal Grandmother     History  Substance Use Topics  . Smoking status: Never Smoker   . Smokeless tobacco: Never Used  . Alcohol Use: No      Review of Systems  All other systems reviewed and are negative.    Allergies  Sulfa antibiotics  Home Medications   Current Outpatient Rx  Name Route Sig Dispense Refill  . ASPIRIN 81 MG PO TBEC Oral Take 81 mg by mouth daily.      . ATENOLOL 50 MG PO TABS Oral Take 1 tablet (50 mg total) by mouth daily. 30 tablet 5  . DAPSONE 100 MG PO TABS Oral Take 1 tablet (100 mg total) by mouth daily.  30 tablet 5  . EMTRICITABINE-TENOFOVIR 200-300 MG PO TABS Oral Take 1 tablet by mouth daily. 30 tablet 5  . LISINOPRIL 40 MG PO TABS Oral Take 1 tablet (40 mg total) by mouth daily. 30 tablet 5  . MELOXICAM 7.5 MG PO TABS Oral Take 2 tablets (15 mg total) by mouth daily. 30 tablet 0  . METFORMIN HCL 1000 MG PO TABS Oral Take 1 tablet (1,000 mg total) by mouth 2 (two) times daily. 60 tablet 5  . OXYCODONE-ACETAMINOPHEN 5-325 MG PO TABS Oral Take 1 tablet by mouth every 6 (six) hours as needed for pain. 20 tablet 0  . RALTEGRAVIR POTASSIUM 400 MG PO TABS Oral Take 1 tablet (400 mg total) by mouth 2 (two) times daily. 60 tablet 5  . TESTOSTERONE 50 MG/5GM TD GEL Transdermal Place onto the skin daily. Apply one packet every day     . ZIDOVUDINE 300 MG PO TABS Oral Take 1 tablet (300 mg total) by mouth 2 (two) times daily. 60 tablet 5  . OXYCODONE-ACETAMINOPHEN 5-325 MG PO TABS Oral Take 1 tablet by mouth every 4 (four) hours as needed for pain. 20 tablet 0    BP 146/110  Temp(Src) 98.6 F (37 C) (Oral)  Resp 20  Ht 5\' 10"  (1.778 m)  Wt 230 lb (104.327 kg)  BMI 33.00 kg/m2  SpO2 100%  Physical Exam  Nursing note and vitals reviewed.  46 year old male who appears somewhat uncomfortable. Vital signs show hypertension with blood pressure 146/110. Oxygen saturation is 100% which is normal. Head is normocephalic and atraumatic. PERRLA, EOMI. Oropharynx is clear. Neck is supple without adenopathy lungs are clear without rales, wheezes, rhonchi. Heart has regular rate and rhythm without murmur. Abdomen is soft, flat, nontender without masses or hepatosplenomegaly. Extremities: There is tenderness to palpation over the anterior thighs and knees. Full range of motion is present but he seems to have pain with range of motion of the hips and knees. There is no cyanosis or edema. Skin is warm and moist without rash. Neurologic: Mental status is normal, cranial nerves are intact, there no focal motor or sensory  deficits.  ED Course  Procedures (including critical care time)    1. Leg pain   2. HIV (human immunodeficiency virus infection)       MDM  I have informed the patient that I could write a prescription for Percocet but not sufficient to last until his appointment in February 13. I have contacted the resident on call for the outpatient clinics and he will leave word to try and arrange either a prescription for sufficient Percocet to last until the scheduled appointment, or will reschedule the appointment in the next several days. He'll need to continue working with his physician at the outpatient clinics regarding referral to pain management.        Delora Fuel, MD [id number]

## 2011-05-01 NOTE — Assessment & Plan Note (Addendum)
He continues to be markedly inflexible on physical exam.  He does not think PT helped, and tried NSAIDs from last appt with no relief.  I informed him that we will not prescribe long term opiates without a diagnosis from a [facility] specialist.  While patient was in clinic, we made appt for him to see Dr. Junius Roads on Friday May 03, 2011 at [facility].  I have written him for Percocet 5-325, 1 tab q8h prn pain #9.  I told him this is the last script he will get from our clinic without further diagnosis.  Dr. Junius Roads' clinic note from patient's last visit in 02/2011 was retrieved, and does not suggest reason for his significant pain, and rheum workup was negative.  He understands this. Of note, patient has had XR (right-01/2011, left - 7/2011of both knees which were completely normal).  He has never had any imaging of his spine, but denies any back pain today.

## 2011-05-01 NOTE — ED Notes (Signed)
Patient with bilat leg pain, getting worse tonight.  Patient states that the pain started two months ago.

## 2011-05-01 NOTE — Patient Instructions (Signed)
-  Please be sure to go to your appointment with Dr. Junius Roads at [facility] on Friday May 03, 2011.  -I am giving you enough percocet to last until that appointment only.  We can not continue to prescribe pain medicine without a diagnosis from a specialist at this point.  -Please return in March for your diabetes check.

## 2011-05-02 NOTE — Progress Notes (Signed)
agree

## 2011-05-03 ENCOUNTER — Emergency Department (HOSPITAL_COMMUNITY)
Admission: EM | Admit: 2011-05-03 | Discharge: 2011-05-03 | Disposition: A | Discharge status: Home / Self Care | Payer: Medicaid Other | Attending: Emergency Medicine | Admitting: Emergency Medicine

## 2011-05-03 ENCOUNTER — Encounter (HOSPITAL_COMMUNITY): Payer: Self-pay | Admitting: Emergency Medicine

## 2011-05-03 DIAGNOSIS — L03019 Cellulitis of unspecified finger: Secondary | ICD-10-CM | POA: Insufficient documentation

## 2011-05-03 DIAGNOSIS — Z7982 Long term (current) use of aspirin: Secondary | ICD-10-CM | POA: Insufficient documentation

## 2011-05-03 DIAGNOSIS — I10 Essential (primary) hypertension: Secondary | ICD-10-CM | POA: Insufficient documentation

## 2011-05-03 DIAGNOSIS — E119 Type 2 diabetes mellitus without complications: Secondary | ICD-10-CM | POA: Insufficient documentation

## 2011-05-03 DIAGNOSIS — IMO0002 Reserved for concepts with insufficient information to code with codable children: Secondary | ICD-10-CM

## 2011-05-03 DIAGNOSIS — M79609 Pain in unspecified limb: Secondary | ICD-10-CM | POA: Insufficient documentation

## 2011-05-03 DIAGNOSIS — Z21 Asymptomatic human immunodeficiency virus [HIV] infection status: Secondary | ICD-10-CM | POA: Insufficient documentation

## 2011-05-03 DIAGNOSIS — Z79899 Other long term (current) drug therapy: Secondary | ICD-10-CM | POA: Insufficient documentation

## 2011-05-03 MED ORDER — DOXYCYCLINE HYCLATE 100 MG PO CAPS: 100.0000 mg | ORAL_CAPSULE | Freq: Two times a day (BID) | ORAL | Status: AC

## 2011-05-03 NOTE — ED Provider Notes (Signed)
History     CSN: [id number]  Arrival date & time 05/03/11  0101   First MD Initiated Contact with Patient 05/03/11 0110      Chief Complaint  Patient presents with  . Finger Injury     HPI  History provided by the patient. Patient is a 46 year old male with significant past history of HIV, diabetes, hypertension presents with complaints of increasing pain around left middle fingernail. Patient reports similar symptoms previously around fingernails. Symptoms have been increasing for the past 3-4 days but pain increased yesterday. Patient has not done anything for his symptoms. She admits to biting his fingernails. He denies any erythema or erythema streaks up the finger. Patient denies any fever, chills, sweats. Patient has no other complaints or symptoms.   Past Medical History  Diagnosis Date  . HIV (human immunodeficiency virus infection)     CD4 count 100, VL 13800 (05/01/2010)  . Diabetes type 2, uncontrolled     HgA1c 17.6 (04/27/2010)  . Hypertension   . Genital warts   . Erectile dysfunction   . Chronic knee pain     right  . Osteomyelitis     h/o hand  . Diabetes mellitus   . Chronic pain     History reviewed. No pertinent past surgical history.  Family History  Problem Relation Age of Onset  . Hypertension Mother   . Arthritis Father   . Hypertension Father   . Hypertension Brother   . Cancer Maternal Grandmother 37    unknown type of cancer  . Depression Paternal Grandmother     History  Substance Use Topics  . Smoking status: Never Smoker   . Smokeless tobacco: Never Used  . Alcohol Use: No      Review of Systems  Constitutional: Negative for fever and chills.  All other systems reviewed and are negative.    Allergies  Sulfa antibiotics  Home Medications   Current Outpatient Rx  Name Route Sig Dispense Refill  . ASPIRIN 81 MG PO TBEC Oral Take 81 mg by mouth daily.      . ATENOLOL 50 MG PO TABS Oral Take 1 tablet (50 mg total) by  mouth daily. 30 tablet 5  . DAPSONE 100 MG PO TABS Oral Take 1 tablet (100 mg total) by mouth daily. 30 tablet 5  . EMTRICITABINE-TENOFOVIR 200-300 MG PO TABS Oral Take 1 tablet by mouth daily. 30 tablet 5  . LISINOPRIL 40 MG PO TABS Oral Take 1 tablet (40 mg total) by mouth daily. 30 tablet 5  . METFORMIN HCL 1000 MG PO TABS Oral Take 1 tablet (1,000 mg total) by mouth 2 (two) times daily. 60 tablet 5  . OXYCODONE-ACETAMINOPHEN 5-325 MG PO TABS Oral Take 1 tablet by mouth every 8 (eight) hours as needed for pain. 9 tablet 0  . RALTEGRAVIR POTASSIUM 400 MG PO TABS Oral Take 1 tablet (400 mg total) by mouth 2 (two) times daily. 60 tablet 5  . TESTOSTERONE 50 MG/5GM TD GEL Transdermal Place onto the skin daily. Apply one packet every day     . ZIDOVUDINE 300 MG PO TABS Oral Take 1 tablet (300 mg total) by mouth 2 (two) times daily. 60 tablet 5    BP 143/90  Pulse 104  Temp(Src) 98.6 F (37 C) (Oral)  Resp 14  SpO2 100%  Physical Exam  Nursing note and vitals reviewed. Constitutional: He is oriented to person, place, and time. He appears well-developed and well-nourished. No distress.  HENT:  Head: Normocephalic.  Cardiovascular: Normal rate and regular rhythm.   Pulmonary/Chest: Effort normal and breath sounds normal.  Musculoskeletal:       Paronychia around left middle fingernail. There is moderate fluctuance with tenderness to palpation. No active drainage or bleeding.  Neurological: He is alert and oriented to person, place, and time.  Skin: Skin is warm. No rash noted. No erythema.  Psychiatric: He has a normal mood and affect. His behavior is normal.    ED Course  Procedures   INCISION AND DRAINAGE Performed by: Martie Lee Consent: Verbal consent obtained. Risks and benefits: risks, benefits and alternatives were discussed Type: paronychia  I&D with 18 gage needle  Anesthesia: none  Drainage: purulent  Drainage amount: moderate   Patient tolerance: Patient  tolerated the procedure well with no immediate complications.      1. Paronychia       MDM  1:30 AM patient seen and evaluated. Patient in no acute distress.        Martie Lee, [place] 05/03/11 0304   Medical screening examination/treatment/procedure(s) were performed by non-physician practitioner and as supervising physician I was immediately available for consultation/collaboration.  Teressa Lower, MD 05/03/11 [phone]

## 2011-05-03 NOTE — ED Notes (Signed)
PT. REPORTS LEFT DISTAL MIDDLE FINGER SWELLING " I BIT A HANG NAIL " ONSET YESTERDAY.

## 2011-05-07 ENCOUNTER — Encounter (HOSPITAL_COMMUNITY): Payer: Self-pay | Admitting: *Deleted

## 2011-05-07 ENCOUNTER — Other Ambulatory Visit: Payer: Self-pay | Admitting: *Deleted

## 2011-05-07 ENCOUNTER — Encounter: Payer: Self-pay | Admitting: *Deleted

## 2011-05-07 ENCOUNTER — Emergency Department (HOSPITAL_COMMUNITY)
Admission: EM | Admit: 2011-05-07 | Discharge: 2011-05-07 | Disposition: A | Discharge status: Home / Self Care | Payer: Medicaid Other | Attending: Emergency Medicine | Admitting: Emergency Medicine

## 2011-05-07 DIAGNOSIS — Z7982 Long term (current) use of aspirin: Secondary | ICD-10-CM | POA: Insufficient documentation

## 2011-05-07 DIAGNOSIS — Z21 Asymptomatic human immunodeficiency virus [HIV] infection status: Secondary | ICD-10-CM | POA: Insufficient documentation

## 2011-05-07 DIAGNOSIS — Z79899 Other long term (current) drug therapy: Secondary | ICD-10-CM | POA: Insufficient documentation

## 2011-05-07 DIAGNOSIS — M25569 Pain in unspecified knee: Secondary | ICD-10-CM | POA: Insufficient documentation

## 2011-05-07 DIAGNOSIS — G8929 Other chronic pain: Secondary | ICD-10-CM | POA: Insufficient documentation

## 2011-05-07 DIAGNOSIS — M7630 Iliotibial band syndrome, unspecified leg: Secondary | ICD-10-CM

## 2011-05-07 DIAGNOSIS — E119 Type 2 diabetes mellitus without complications: Secondary | ICD-10-CM | POA: Insufficient documentation

## 2011-05-07 DIAGNOSIS — M79609 Pain in unspecified limb: Secondary | ICD-10-CM | POA: Insufficient documentation

## 2011-05-07 DIAGNOSIS — I10 Essential (primary) hypertension: Secondary | ICD-10-CM | POA: Insufficient documentation

## 2011-05-07 MED ORDER — KETOROLAC TROMETHAMINE 30 MG/ML IJ SOLN
30.0000 mg | Freq: Once | INTRAMUSCULAR | Status: AC
  Administered 2011-05-07: 30 mg via INTRAMUSCULAR
  Filled 2011-05-07: qty 1

## 2011-05-07 NOTE — ED Notes (Signed)
Pt presents to department for evaluation of bilateral leg pain. Ongoing for several days. Pt states he ran out of his percocet recently. Ambulatory without difficulty. He is alert and oriented x4. 5/10 pain at the time. No other complaints. Able to move both legs without difficulty. Sensation intact.

## 2011-05-07 NOTE — Discharge Instructions (Signed)
Chronic Pain Management Managing chronic pain is not easy. The goal is to provide as much pain relief as possible. There are emotional as well as physical problems. Chronic pain may lead to symptoms of depression which magnify those of the pain. Problems may include:  Anxiety.   Sleep disturbances.   Confused thinking.   Feeling cranky.   Fatigue.   Weight gain or loss.  Identify the source of the pain first, if possible. The pain may be masking another problem. Try to find a pain management specialist or clinic. Work with a team to create a treatment plan for you. MEDICATIONS  May include narcotics or opioids. Larger than normal doses may be needed to control your pain.   Drugs for depression may help.   Over-the-counter medicines may help for some conditions. These drugs may be used along with others for better pain relief.   May be injected into sites such as the spine and joints. Injections may have to be repeated if they wear off.  THERAPY MAY INCLUDE:  Working with a physical therapist to keep from getting stiff.   Regular, gentle exercise.   Cognitive or behavioral therapy.   Using complementary or integrative medicine such as:   Acupuncture.   Massage, Reiki, or Rolfing.   Aroma, color, light, or sound therapy.   Group support.  FOR MORE INFORMATION https://www.rubio.com/. American Chronic Pain Association http://www.mcdowell.com/. Document Released: 05/02/2004 Document Revised: 12/05/2010 Document Reviewed: 06/11/2007 [facility] Patient Information 2012 [place], [place].

## 2011-05-07 NOTE — Telephone Encounter (Signed)
Pt got # 9 last week on the 23rd.   I read last note and noted no more pain meds until further diagnosis. Pt did have appointment today at [facility] orthopedic's and was set up for nerve study. He is here asking for more percocet.

## 2011-05-07 NOTE — ED Notes (Signed)
Patient complains of bil leg pain.  He is out of his percocet.

## 2011-05-07 NOTE — ED Provider Notes (Addendum)
History     CSN: [id number]  Arrival date & time 05/07/11  1147   First MD Initiated Contact with Patient 05/07/11 1315      Chief Complaint  Patient presents with  . Leg Pain    (Consider location/radiation/quality/duration/timing/severity/associated sxs/prior treatment) HPI Comments: Patient is here complaining of some chronic bilateral leg pain that he indicates from the sides of his hips down his thighs toward his knees. He reports that he thinks that this is due to I nerve problem possibly related to diabetes. I have looked through epic, and I see notes from his primary care physician with outpatient clinics for which she was seen on January 23. At that time they informed him that they were not going to continue giving him Percocet without a definitive diagnosis. They note that he has not had any acute rheumatologic disorders. The patient does have a tendency to come to the emergency department very frequently and usually does obtain prescriptions for Percocet. The patient denies any back pain, urinary symptoms and denies any acute injuries. He does not have any fevers or chills.  Patient is a 46 y.o. male presenting with leg pain. The history is provided by the patient and medical records.  Leg Pain     Past Medical History  Diagnosis Date  . HIV (human immunodeficiency virus infection)     CD4 count 100, VL 13800 (05/01/2010)  . Diabetes type 2, uncontrolled     HgA1c 17.6 (04/27/2010)  . Hypertension   . Genital warts   . Erectile dysfunction   . Chronic knee pain     right  . Osteomyelitis     h/o hand  . Diabetes mellitus   . Chronic pain     History reviewed. No pertinent past surgical history.  Family History  Problem Relation Age of Onset  . Hypertension Mother   . Arthritis Father   . Hypertension Father   . Hypertension Brother   . Cancer Maternal Grandmother 62    unknown type of cancer  . Depression Paternal Grandmother     History  Substance Use  Topics  . Smoking status: Never Smoker   . Smokeless tobacco: Never Used  . Alcohol Use: No      Review of Systems  Constitutional: Negative for fever and chills.  Gastrointestinal: Negative for abdominal pain.  Musculoskeletal: Positive for arthralgias. Negative for back pain.    Allergies  Sulfa antibiotics  Home Medications   Current Outpatient Rx  Name Route Sig Dispense Refill  . ASPIRIN 81 MG PO TBEC Oral Take 81 mg by mouth daily.      . ATENOLOL 50 MG PO TABS Oral Take 1 tablet (50 mg total) by mouth daily. 30 tablet 5  . DAPSONE 100 MG PO TABS Oral Take 1 tablet (100 mg total) by mouth daily. 30 tablet 5  . DOXYCYCLINE HYCLATE 100 MG PO CAPS Oral Take 1 capsule (100 mg total) by mouth 2 (two) times daily. 20 capsule 0  . EMTRICITABINE-TENOFOVIR 200-300 MG PO TABS Oral Take 1 tablet by mouth daily. 30 tablet 5  . LISINOPRIL 40 MG PO TABS Oral Take 1 tablet (40 mg total) by mouth daily. 30 tablet 5  . METFORMIN HCL 1000 MG PO TABS Oral Take 1 tablet (1,000 mg total) by mouth 2 (two) times daily. 60 tablet 5  . OXYCODONE-ACETAMINOPHEN 5-325 MG PO TABS Oral Take 1 tablet by mouth every 8 (eight) hours as needed for pain. 9 tablet 0  .  RALTEGRAVIR POTASSIUM 400 MG PO TABS Oral Take 1 tablet (400 mg total) by mouth 2 (two) times daily. 60 tablet 5  . TESTOSTERONE 50 MG/5GM TD GEL Transdermal Place onto the skin daily. Apply one packet every day     . ZIDOVUDINE 300 MG PO TABS Oral Take 1 tablet (300 mg total) by mouth 2 (two) times daily. 60 tablet 5    BP 121/93  Pulse 113  Temp(Src) 98.2 F (36.8 C) (Oral)  Resp 20  Ht 5\' 10"  (1.778 m)  Wt 240 lb (108.863 kg)  BMI 34.44 kg/m2  SpO2 98%  Physical Exam  Vitals reviewed. Constitutional: He is oriented to person, place, and time. He appears well-developed and well-nourished.  Musculoskeletal: He exhibits no edema and no tenderness.       No gross abnormalities of either lower extremities. He is nontender along his  midline thoracic or lumbar spine. There is no skin rash noted.  Neurological: He is alert and oriented to person, place, and time. He has normal strength. He exhibits normal muscle tone.  Skin: Skin is warm and dry. No rash noted.    ED Course  Procedures (including critical care time)  Labs Reviewed - No data to display No results found.   1. Chronic pain       MDM   I've reviewed the patient's prior records. He was seen by the resident physician Dr. Victorio Palm and affirmed by Dr. Stann Mainland. I've informed the patient that in March department is not the appropriate location for refills of chronic pain medication especially narcotics. My plan is to discuss with the resident physician on call him if they feel comfortable with me giving the patient it refilled and I will give him a refill based on the the recommendations. Otherwise he'll be discharged home and he will need to keep his appointments with his specialists and his primary care physician for ongoing chronic pain issues.      1:46 PM I spoke to the resident physician on call who did some more investigating. Apparently the patient did see his orthopedist this morning and the next step was to obtain nerve function studies for possible paresthesias perhaps secondary to his diabetes. The orthopedist recommended taking NSAIDs rather than Percocet. Therefore the orthopedist nor the primary care physicians wish to give him narcotic pain medicine currently until his nerve conduction study is completed. Therefore I will offer the patient a shot of Toradol here and otherwise he can continue to followup with his primary care physician for additional analgesic treatment as indicated.  Saddie Benders. Dorna Mai, MD 05/07/11 [place] Kris Burd, MD 05/07/11 1349

## 2011-05-07 NOTE — Telephone Encounter (Signed)
No further opiates from our clinic.

## 2011-05-08 NOTE — Progress Notes (Signed)
Subjective:    [name] is a 46 y.o. male.  Chief Complaint: HIV Follow-up Visit BABAJIDE RUFENACHT is here for follow-up of HIV infection. He is feeling unchanged since his last visit.  He claims continued adherence to therapy with good tolerance and no complications. There are not additional complaints.   Data Review: Diagnostic studies reviewed.  Review of Systems - General ROS: negative for - fatigue, malaise, night sweats or weight loss Psychological ROS: negative for - anxiety, behavioral disorder, concentration difficulties, depression, hallucinations or mood swings Ophthalmic ROS: negative for - blurry vision, decreased vision or double vision Endocrine ROS: positive for - polydipsia/polyuria and skin changes Respiratory ROS: no cough, shortness of breath, or wheezing Cardiovascular ROS: no chest pain or dyspnea on exertion Genito-Urinary ROS: positive for - urinary frequency/urgency  Objective:  General appearance: alert, cooperative, appears stated age and no distress Head: Normocephalic, without obvious abnormality, atraumatic Neck: no adenopathy, no carotid bruit, no JVD, supple, symmetrical, trachea midline and thyroid not enlarged, symmetric, no tenderness/mass/nodules Resp: clear to auscultation bilaterally Cardio: regular rate and rhythm, S1, S2 normal, no murmur, click, rub or gallop Extremities: extremities normal, atraumatic, no cyanosis or edema Neurologic: Alert and oriented X 3, normal strength and tone. Normal symmetric reflexes. Normal coordination and gait Skin:  No active lesions or rashes noted.    Psych:  No vegetative signs or delusional behaviors noted.    Laboratory: From 10/11/10 ,  CD4 count was 60 c/cmm @ 4%.Viral load 30,300 copies/ml. HIV genotype pansensitive.     Assessment/Plan:   HIV DISEASE Virologically poorly controlled and CD4 count significantly low.  Although he claims adherence to his medications, his genotype is  consistent with wild type virus which is consistent with nonadherent >50%. Counseling provided on prevention of transmission of HIV. Medication adherence discussed with patient. Follow up visit in 4 months with labs 2 weeks prior to appointment. Patient acknowledged information provided to them and agreed with plan of care.   Luisalberto Beegle A. Magdalen Spatz, [place], [facility] for Infectious Disease [phone]  05/08/2011, 11:29 AM      Romona Curls A. Magdalen Spatz, [place], [facility] for Infectious Disease [phone]  05/08/2011, 11:31 AM

## 2011-05-08 NOTE — Assessment & Plan Note (Signed)
Virologically poorly controlled and CD4 count significantly low.  Although he claims adherence to his medications, his genotype is consistent with wild type virus which is consistent with nonadherent >50%. Counseling provided on prevention of transmission of HIV. Medication adherence discussed with patient. Follow up visit in 4 months with labs 2 weeks prior to appointment. Patient acknowledged information provided to them and agreed with plan of care.   Hartley Urton A. Magdalen Spatz, [place], [facility] for Infectious Disease [phone]  05/08/2011, 11:29 AM

## 2011-05-10 ENCOUNTER — Ambulatory Visit (INDEPENDENT_AMBULATORY_CARE_PROVIDER_SITE_OTHER): Payer: Medicaid Other | Admitting: Internal Medicine

## 2011-05-10 ENCOUNTER — Encounter: Payer: Self-pay | Admitting: Internal Medicine

## 2011-05-10 VITALS — BP 137/89 | HR 104 | Temp 97.8°F | Wt 244.2 lb

## 2011-05-10 DIAGNOSIS — IMO0001 Reserved for inherently not codable concepts without codable children: Secondary | ICD-10-CM

## 2011-05-10 DIAGNOSIS — M629 Disorder of muscle, unspecified: Secondary | ICD-10-CM

## 2011-05-10 DIAGNOSIS — M7630 Iliotibial band syndrome, unspecified leg: Secondary | ICD-10-CM

## 2011-05-10 DIAGNOSIS — E1165 Type 2 diabetes mellitus with hyperglycemia: Secondary | ICD-10-CM

## 2011-05-10 DIAGNOSIS — IMO0002 Reserved for concepts with insufficient information to code with codable children: Secondary | ICD-10-CM

## 2011-05-10 DIAGNOSIS — I10 Essential (primary) hypertension: Secondary | ICD-10-CM

## 2011-05-10 NOTE — Assessment & Plan Note (Signed)
Once again patient presents today for pain in his thighs. He has seen orthopedic surgery and they do not have a diagnosis for him and pus have advised nerve conduction studies for further evaluation. Even if this is a neuropathic pain Percocet is not a great option. Patient reports that he has tried NSAIDs, Lyrica, gabapentin, and physical therapy without significant relief. Furthermore he was given a prescription for Percocet 9 tablets. Dr. Lynnae January and I did do a narcotic database search on him and it appears that the patient has received 4 prescriptions for Percocet in the month of January 2013. Patient has received scrips anywhere from 20-120 tablets. He did not fill the prescription that Dr. Victorio Palm had given him for 9 tablets. He has received 210 tablets in total. He has been filling these prescriptions at 2 different pharmacies. [facility] was called to confirm if he has been receiving these prescriptions. He has yet to pick up his most recent prescription for 120 tablets. Despite this he claims that he has run out of and has no further prescriptions. I do not have a significant diagnosis to suffice Percocet therapy. Furthermore he is in multiple violations where he would not receive narcotic therapy. He has been doctor shopping, pharmacy shopping and refuses to give me a urine sample today. He will no longer be receiving any prescriptions in this clinic from this point on.

## 2011-05-10 NOTE — Patient Instructions (Signed)
Please followup after nerve conduction studies have been done.

## 2011-05-10 NOTE — Assessment & Plan Note (Signed)
Lab Results  Component Value Date   HGBA1C 9.0* 03/13/2011   HGBA1C 8.9 03/13/2011   CREATININE 0.67 04/20/2011   CREATININE 0.86 03/13/2011   MICROALBUR 7.45* 04/10/2010   MICRALBCREAT 75.9* 04/10/2010   CHOL 209* 03/13/2011   HDL 29* 03/13/2011   TRIG 126 03/13/2011    Last eye exam and foot exam:    Component Value Date/Time   HMDIABFOOTEX done 04/05/2010    Assessment: Diabetes control: not controlled Progress toward goals: improved Barriers to meeting goals: no barriers identified  Plan: Diabetes treatment: continue current medications Refer to: none Instruction/counseling given: reminded to bring blood glucose meter & log to each visit and reminded to bring medications to each visit Asked for microalb/cr ratio, however patient unable to provide urine sample. Pt states he is compliant with ACE-I.

## 2011-05-10 NOTE — Assessment & Plan Note (Signed)
Lab Results  Component Value Date   NA 139 04/20/2011   K 3.9 04/20/2011   CL 103 04/20/2011   CO2 26 04/20/2011   BUN 9 04/20/2011   CREATININE 0.67 04/20/2011   CREATININE 0.86 03/13/2011    BP Readings from Last 3 Encounters:  05/10/11 137/89  05/07/11 118/88  05/03/11 143/90    Assessment: Hypertension control:  controlled  Progress toward goals:  at goal Barriers to meeting goals:  no barriers identified  Plan: Hypertension treatment:  continue current medications

## 2011-05-10 NOTE — Progress Notes (Signed)
Subjective:     [name], male   DOB: Aug 31, 1965, 46 y.o.   MRN: [id number]  HPI Keith [name] is a 46 y.o. man that presents for acute visit regarding b/l LE pain that starts in thighs (reports medial, but shows me lateral). He was seen for this in clinic on 03/13/11. Since that clinic visit, he has been to the ED 7 times for this problem and has gotten percocet, most recently on 05/07/11 where he was given a toradol shot. He also saw Dr. Victorio Palm for this problem on 05/01/2011. He describes pain as achy, better with percocet, warm bath and walking. Pain is worse in cold. Pain started 01/2011 after he started exercising a lot more because of poor DM control. He has had 40lb weight loss since 08/2010 by exercise and diet change. He has gone from HbA1c >20, to 9 most recently. He notes occasional shooting pain sensation, but feels achy mostly. No urinary incontinence. No back pain. He does complain of occasional b/l feet pain/burning.   Would like another script for percocet today as he has run out.   Review of Systems  All other systems reviewed and are negative.       Objective:   Physical Exam  Constitutional: He appears well-developed.  HENT:  Head: Normocephalic.  Neck: Normal range of motion.  Cardiovascular: Normal rate.   Pulmonary/Chest: Effort normal.  Musculoskeletal: Normal range of motion. He exhibits no edema and no tenderness.

## 2011-05-15 NOTE — Progress Notes (Signed)
Addended by: Marcelino Duster on: 05/15/2011 09:19 AM   Modules accepted: Orders

## 2011-05-16 ENCOUNTER — Emergency Department (HOSPITAL_COMMUNITY)
Admission: EM | Admit: 2011-05-16 | Discharge: 2011-05-16 | Disposition: A | Discharge status: Home / Self Care | Payer: Medicaid Other | Attending: Emergency Medicine | Admitting: Emergency Medicine

## 2011-05-16 ENCOUNTER — Encounter (HOSPITAL_COMMUNITY): Payer: Self-pay | Admitting: *Deleted

## 2011-05-16 DIAGNOSIS — M79609 Pain in unspecified limb: Secondary | ICD-10-CM | POA: Insufficient documentation

## 2011-05-16 DIAGNOSIS — L03012 Cellulitis of left finger: Secondary | ICD-10-CM

## 2011-05-16 DIAGNOSIS — I10 Essential (primary) hypertension: Secondary | ICD-10-CM | POA: Insufficient documentation

## 2011-05-16 DIAGNOSIS — Z79899 Other long term (current) drug therapy: Secondary | ICD-10-CM | POA: Insufficient documentation

## 2011-05-16 DIAGNOSIS — Z21 Asymptomatic human immunodeficiency virus [HIV] infection status: Secondary | ICD-10-CM | POA: Insufficient documentation

## 2011-05-16 DIAGNOSIS — Z7982 Long term (current) use of aspirin: Secondary | ICD-10-CM | POA: Insufficient documentation

## 2011-05-16 DIAGNOSIS — E119 Type 2 diabetes mellitus without complications: Secondary | ICD-10-CM | POA: Insufficient documentation

## 2011-05-16 DIAGNOSIS — G8929 Other chronic pain: Secondary | ICD-10-CM | POA: Insufficient documentation

## 2011-05-16 DIAGNOSIS — M7989 Other specified soft tissue disorders: Secondary | ICD-10-CM | POA: Insufficient documentation

## 2011-05-16 DIAGNOSIS — L03019 Cellulitis of unspecified finger: Secondary | ICD-10-CM | POA: Insufficient documentation

## 2011-05-16 NOTE — ED Notes (Addendum)
C/o L thumb pain and swelling c/w paronychia, onset Wednesday morning, r/t "biting nails & pulling hangnail", (denies: fever, drainage, nv, dizziness or other sx), rates pain 8/10, "last cbg 110", pt seen by EDNP prior to RN assessment, see NP notes, orders received and initiated, L thumb blocked, cleaned/soaked in betadine & I&D'd by EDNP.

## 2011-05-16 NOTE — ED Provider Notes (Signed)
History     CSN: [id number]  Arrival date & time 05/16/11  0343   First MD Initiated Contact with Patient 05/16/11 [phone]      Chief Complaint  Patient presents with  . Finger Injury    (Consider location/radiation/quality/duration/timing/severity/associated sxs/prior treatment) HPI Comments: As the the cuticle around his left thumb nail now with this tenderness, swelling  The history is provided by the patient.    Past Medical History  Diagnosis Date  . HIV (human immunodeficiency virus infection)     CD4 count 100, VL 13800 (05/01/2010)  . Diabetes type 2, uncontrolled     HgA1c 17.6 (04/27/2010)  . Hypertension   . Genital warts   . Erectile dysfunction   . Chronic knee pain     right  . Osteomyelitis     h/o hand  . Diabetes mellitus   . Chronic pain     History reviewed. No pertinent past surgical history.  Family History  Problem Relation Age of Onset  . Hypertension Mother   . Arthritis Father   . Hypertension Father   . Hypertension Brother   . Cancer Maternal Grandmother 35    unknown type of cancer  . Depression Paternal Grandmother     History  Substance Use Topics  . Smoking status: Never Smoker   . Smokeless tobacco: Never Used  . Alcohol Use: No      Review of Systems  Constitutional: Negative for fever and chills.  Musculoskeletal: Negative for joint swelling.    Allergies  Sulfa antibiotics  Home Medications   Current Outpatient Rx  Name Route Sig Dispense Refill  . ASPIRIN 81 MG PO TBEC Oral Take 81 mg by mouth daily.      . ATENOLOL 50 MG PO TABS Oral Take 1 tablet (50 mg total) by mouth daily. 30 tablet 5  . DAPSONE 100 MG PO TABS Oral Take 1 tablet (100 mg total) by mouth daily. 30 tablet 5  . EMTRICITABINE-TENOFOVIR 200-300 MG PO TABS Oral Take 1 tablet by mouth daily. 30 tablet 5  . LISINOPRIL 40 MG PO TABS Oral Take 1 tablet (40 mg total) by mouth daily. 30 tablet 5  . METFORMIN HCL 1000 MG PO TABS Oral Take 1 tablet  (1,000 mg total) by mouth 2 (two) times daily. 60 tablet 5  . RALTEGRAVIR POTASSIUM 400 MG PO TABS Oral Take 1 tablet (400 mg total) by mouth 2 (two) times daily. 60 tablet 5  . TESTOSTERONE 50 MG/5GM TD GEL Transdermal Place onto the skin daily. Apply one packet every day     . ZIDOVUDINE 300 MG PO TABS Oral Take 1 tablet (300 mg total) by mouth 2 (two) times daily. 60 tablet 5    BP 130/95  Pulse 114  Temp 97.9 F (36.6 C)  Resp 18  SpO2 98%  Physical Exam  Constitutional: He is oriented to person, place, and time. He appears well-developed and well-nourished.  HENT:  Head: Normocephalic.  Eyes: Pupils are equal, round, and reactive to light.  Neck: Normal range of motion.  Cardiovascular: Normal rate.   Pulmonary/Chest: Effort normal.  Musculoskeletal:       Hands: Neurological: He is alert and oriented to person, place, and time.  Skin: Skin is warm and dry.    ED Course  Drain paronychia Date/Time: 05/16/2011 4:11 AM Performed by: Garald Balding Authorized by: Garald Balding Consent: Verbal consent obtained. Risks and benefits: risks, benefits and alternatives were discussed Consent given by:  patient Patient consent: the patient's understanding of the procedure matches consent given Patient identity confirmed: verbally with patient Time out: Immediately prior to procedure a "time out" was called to verify the correct patient, procedure, equipment, support staff and site/side marked as required. Preparation: Patient was prepped and draped in the usual sterile fashion. Local anesthesia used: yes Anesthesia: digital block Local anesthetic: lidocaine 1% without epinephrine Anesthetic total: 3 ml Patient sedated: no Patient tolerance: Patient tolerated the procedure well with no immediate complications.   (including critical care time)  Labs Reviewed - No data to display No results found.   1. Paronychia of left thumb       MDM  Paronychia        Garald Balding, NP 05/16/11 0414  Garald Balding, NP 05/16/11 [phone]

## 2011-05-16 NOTE — ED Provider Notes (Signed)
Medical screening examination/treatment/procedure(s) were performed by non-physician practitioner and as supervising physician I was immediately available for consultation/collaboration.  Chauncy Passy, MD 05/16/11 [phone]

## 2011-05-16 NOTE — ED Notes (Signed)
Pt reports pulling a hang nail on his (L) thumb yesterday.  Noted swelling and pain tonight.  No obvious injury noted, slight swelling.

## 2011-05-21 ENCOUNTER — Encounter (HOSPITAL_COMMUNITY): Payer: Self-pay

## 2011-05-21 ENCOUNTER — Emergency Department (HOSPITAL_COMMUNITY)
Admission: EM | Admit: 2011-05-21 | Discharge: 2011-05-21 | Disposition: A | Discharge status: Home / Self Care | Payer: Medicaid Other | Attending: Emergency Medicine | Admitting: Emergency Medicine

## 2011-05-21 DIAGNOSIS — M25559 Pain in unspecified hip: Secondary | ICD-10-CM | POA: Insufficient documentation

## 2011-05-21 DIAGNOSIS — M79609 Pain in unspecified limb: Secondary | ICD-10-CM | POA: Insufficient documentation

## 2011-05-21 DIAGNOSIS — I10 Essential (primary) hypertension: Secondary | ICD-10-CM | POA: Insufficient documentation

## 2011-05-21 DIAGNOSIS — G8929 Other chronic pain: Secondary | ICD-10-CM | POA: Insufficient documentation

## 2011-05-21 DIAGNOSIS — M79606 Pain in leg, unspecified: Secondary | ICD-10-CM

## 2011-05-21 DIAGNOSIS — N529 Male erectile dysfunction, unspecified: Secondary | ICD-10-CM | POA: Insufficient documentation

## 2011-05-21 DIAGNOSIS — E119 Type 2 diabetes mellitus without complications: Secondary | ICD-10-CM | POA: Insufficient documentation

## 2011-05-21 DIAGNOSIS — Z21 Asymptomatic human immunodeficiency virus [HIV] infection status: Secondary | ICD-10-CM | POA: Insufficient documentation

## 2011-05-21 LAB — GLUCOSE, CAPILLARY: Glucose-Capillary: 140 mg/dL — ABNORMAL HIGH (ref 70–99)

## 2011-05-21 NOTE — ED Notes (Signed)
Complains of leg pain since October, also sts that his diabetes "has been out of whack"

## 2011-05-21 NOTE — Discharge Instructions (Signed)
Please plan to discuss your pain medicine with Outpatient Clinics. It is best to obtain prescription pain medication for long-term pain from your primary care doctor rather than the ER. Make sure to keep your appointment for your nerve study. If you are unable to walk, have a high fever, or have any other worrisome symptoms, return to the ER.  RESOURCE GUIDE  Dental Problems  Patients with Medicaid: [place] [facility] [place] [facility] Phone:  [phone]                                                  Phone:  [phone]  If unable to pay or uninsured, contact:  [facility] or [facility]. to become qualified for the adult dental clinic.  Chronic Pain Problems Contact [facility]  [phone] Patients need to be referred by their primary care doctor.  Insufficient Money for Medicine Contact [employer]:  call "211" or [place] [phone].  No Primary Care Doctor Call [facility]  [phone] Other agencies that provide inexpensive medical care    Day  [phone]    [facility] Internal [facility]  [place]  [phone]    [facility]  [phone]    [facility]  [place]  [place]  [phone] [place]   [phone] (emergency services [phone])  Substance Abuse [facility]  [phone] [facility] [phone] [facility] [place] [phone] [facility] [phone] Residential & Outpatient Substance Abuse Program  [phone]  Abuse/Neglect [facility] 8508788455 [place] [phone] (After Hours)  Emergency [place] [phone]  [place] at the [place] [phone] [place] [phone]  MRSA Hotline #:   534-347-2396    [facility] Dept. [address]      [place] Phone:  [phone]  Phone:  [phone]                 Phone:  [place] Phone:  [place] [phone] (After Hours)  Chronic Pain Management Managing chronic pain is not easy. The goal is to provide as much pain relief as possible. There are emotional as well as physical problems. Chronic pain may lead to symptoms of depression which magnify those of the pain. Problems may include:  Anxiety.   Sleep disturbances.   Confused thinking.   Feeling cranky.   Fatigue.   Weight gain or loss.  Identify the source of the pain first, if possible. The pain may be masking another problem. Try to find a pain management specialist or clinic. Work with a team to create a treatment plan for you. MEDICATIONS  May include narcotics or opioids. Larger than normal doses may be needed to control your pain.   Drugs for depression may help.   Over-the-counter medicines may help for some conditions. These drugs may be used along with others for better pain relief.   May be injected into sites such as the spine and joints. Injections may have to be repeated if they wear off.  THERAPY MAY INCLUDE:  Working with a physical therapist to keep from getting stiff.   Regular, gentle exercise.   Cognitive or behavioral therapy.   Using complementary or integrative medicine such as:   Acupuncture.   Massage,  Reiki, or Rolfing.   Aroma, color, light, or sound therapy.   Group support.  FOR MORE INFORMATION [email or web address].rubio.com/. American Chronic Pain Association http://www.mcdowell.com/. Document Released: 05/02/2004 Document Revised: 12/05/2010 Document Reviewed: 06/11/2007 [facility] Patient Information 2012 [place], [place].Chronic Pain Management Managing chronic pain is not easy. The goal is to provide as much pain relief as possible. There are emotional as well as physical problems. Chronic pain may lead to symptoms of depression which magnify those of the pain. Problems may include:  Anxiety.   Sleep disturbances.   Confused thinking.   Feeling cranky.   Fatigue.   Weight gain or loss.  Identify the source of the pain first, if possible. The pain may be masking another problem. Try to find a pain management specialist or clinic. Work with a team to create a treatment plan for you. MEDICATIONS  May include narcotics or opioids. Larger than normal doses may be needed to control your pain.   Drugs for depression may help.   Over-the-counter medicines may help for some conditions. These drugs may be used along with others for better pain relief.   May be injected into sites such as the spine and joints. Injections may have to be repeated if they wear off.  THERAPY MAY INCLUDE:  Working with a physical therapist to keep from getting stiff.   Regular, gentle exercise.   Cognitive or behavioral therapy.   Using complementary or integrative medicine such as:   Acupuncture.   Massage, Reiki, or Rolfing.   Aroma, color, light, or sound therapy.   Group support.  FOR MORE INFORMATION https://www.rubio.com/. American Chronic Pain Association [email or web address].mcdowell.[email or web address]/. Document Released: 05/02/2004 Document Revised: 12/05/2010 Document Reviewed: 06/11/2007 [facility] Patient Information 2012 [place], [place].

## 2011-05-21 NOTE — ED Provider Notes (Signed)
History     CSN: [id number]  Arrival date & time 05/21/11  0707   First MD Initiated Contact with Patient 05/21/11 [phone]      Chief Complaint  Patient presents with  . Leg Pain    (Consider location/radiation/quality/duration/timing/severity/associated sxs/prior treatment) Patient is a 46 y.o. male presenting with leg pain. The history is provided by the patient.  Leg Pain  The incident occurred more than 1 week ago. There was no injury mechanism. The pain is present in the right thigh and left thigh. The quality of the pain is described as aching and throbbing. The pain is moderate. Pertinent negatives include no numbness, no inability to bear weight, no loss of motion, no muscle weakness, no loss of sensation and no tingling. The symptoms are aggravated by nothing.   Pt presents with leg pain which has been present since Oct 2012. Pain is located in the thighs bilaterally and does not seem to radiate; described as a throbbing sensation. Also complains of intermittent "pinpricks" to soles of feet bilaterally. Pain does not worsen with walking. Denies numbness or weakness in legs, bowel/bladder dysfunction, saddle anesthesia.  Pt is seen by Outpatient Clinics. A review of previous ED and [facility] notes indicate that he has been worked up for this extensively. He has been seen by orthopaedics without a definitive etiology found, and has done PT which seemed to help briefly, but he was unable to continue as his insurance wouldn't pay for more sessions. He is scheduled for a nerve conduction study next week. He did have a pain contract with [facility] but a note from January 31 indicates that he was not to receive any more Percocet from the clinic as he was obtaining medication from multiple sources. He was given a prescription for gabapentin at that time. Pt requests rx for Percocet today. States "I wish I could just get admitted to find out the cause of my pain."  Past Medical History  Diagnosis Date  .  HIV (human immunodeficiency virus infection)     CD4 count 100, VL 13800 (05/01/2010)  . Diabetes type 2, uncontrolled     HgA1c 17.6 (04/27/2010)  . Hypertension   . Genital warts   . Erectile dysfunction   . Chronic knee pain     right  . Osteomyelitis     h/o hand  . Diabetes mellitus   . Chronic pain     History reviewed. No pertinent past surgical history.  Family History  Problem Relation Age of Onset  . Hypertension Mother   . Arthritis Father   . Hypertension Father   . Hypertension Brother   . Cancer Maternal Grandmother 16    unknown type of cancer  . Depression Paternal Grandmother     History  Substance Use Topics  . Smoking status: Never Smoker   . Smokeless tobacco: Never Used  . Alcohol Use: No      Review of Systems  Constitutional: Negative.   Genitourinary: Negative for difficulty urinating.  Musculoskeletal: Positive for myalgias. Negative for joint swelling, arthralgias and gait problem.  Skin: Negative.   Neurological: Negative for tingling and numbness.    Allergies  Sulfa antibiotics  Home Medications   Current Outpatient Rx  Name Route Sig Dispense Refill  . ASPIRIN 81 MG PO TBEC Oral Take 81 mg by mouth daily.      . ATENOLOL 50 MG PO TABS Oral Take 1 tablet (50 mg total) by mouth daily. 30 tablet 5  . DAPSONE  100 MG PO TABS Oral Take 1 tablet (100 mg total) by mouth daily. 30 tablet 5  . EMTRICITABINE-TENOFOVIR 200-300 MG PO TABS Oral Take 1 tablet by mouth daily. 30 tablet 5  . GABAPENTIN 100 MG PO CAPS Oral Take 100 mg by mouth 3 (three) times daily.    Marland Kitchen LISINOPRIL 40 MG PO TABS Oral Take 1 tablet (40 mg total) by mouth daily. 30 tablet 5  . METFORMIN HCL 1000 MG PO TABS Oral Take 1 tablet (1,000 mg total) by mouth 2 (two) times daily. 60 tablet 5  . RALTEGRAVIR POTASSIUM 400 MG PO TABS Oral Take 1 tablet (400 mg total) by mouth 2 (two) times daily. 60 tablet 5  . TESTOSTERONE 50 MG/5GM TD GEL Transdermal Place onto the skin  daily. Apply one packet every day     . ZIDOVUDINE 300 MG PO TABS Oral Take 1 tablet (300 mg total) by mouth 2 (two) times daily. 60 tablet 5    BP 121/88  Pulse 112  Temp(Src) 98.1 F (36.7 C) (Oral)  Resp 20  SpO2 100%  Physical Exam  Nursing note and vitals reviewed. Constitutional: He is oriented to person, place, and time. He appears well-developed and well-nourished. No distress.  HENT:  Head: Normocephalic and atraumatic.  Eyes: EOM are normal.  Neck: Normal range of motion.  Pulmonary/Chest: Effort normal.  Musculoskeletal: Normal range of motion. He exhibits tenderness.       Tender to palp over lateral thighs b/l. No swelling, deformity noted. No swelling of calves noted. Neurovascularly intact bilaterally with sensory intact to lt touch. DP/PT pulses intact.  Nontender to palp over midline t-spine, l-spine.  Neurological: He is alert and oriented to person, place, and time. He has normal reflexes. He exhibits normal muscle tone. Coordination normal.  Skin: Skin is warm and dry. No rash noted. He is not diaphoretic.  Psychiatric: His mood appears anxious.    ED Course  Procedures (including critical care time)  Labs Reviewed  GLUCOSE, CAPILLARY - Abnormal; Notable for the following:    Glucose-Capillary 140 (*)    All other components within normal limits   No results found.   1. Chronic leg pain       MDM  Pt with leg pain x months which is currently being worked up as an outpatient; he is scheduled for nerve conduction study next week. Pt requests Percocet today. I stated that I did not feel comfortable prescribing him further narcotic pain medication as an ED provider. I offered a shot of Toradol which he declined. Encouraged him to make f/u with [facility] to discuss further. Return precautions discussed.        Abran Richard, [place] 05/21/11 [phone]

## 2011-05-22 NOTE — ED Provider Notes (Signed)
Medical screening examination/treatment/procedure(s) were performed by non-physician practitioner and as supervising physician I was immediately available for consultation/collaboration.    Dot Lanes, MD 05/22/11 1054

## 2011-05-24 ENCOUNTER — Encounter: Payer: Self-pay | Admitting: Dietician

## 2011-05-24 ENCOUNTER — Ambulatory Visit (INDEPENDENT_AMBULATORY_CARE_PROVIDER_SITE_OTHER): Payer: Medicaid Other | Admitting: Dietician

## 2011-05-24 ENCOUNTER — Encounter: Payer: Self-pay | Admitting: Ophthalmology

## 2011-05-24 ENCOUNTER — Ambulatory Visit (INDEPENDENT_AMBULATORY_CARE_PROVIDER_SITE_OTHER): Payer: Medicaid Other | Admitting: Ophthalmology

## 2011-05-24 DIAGNOSIS — E119 Type 2 diabetes mellitus without complications: Secondary | ICD-10-CM

## 2011-05-24 DIAGNOSIS — G8929 Other chronic pain: Secondary | ICD-10-CM

## 2011-05-24 DIAGNOSIS — E1165 Type 2 diabetes mellitus with hyperglycemia: Secondary | ICD-10-CM

## 2011-05-24 DIAGNOSIS — IMO0002 Reserved for concepts with insufficient information to code with codable children: Secondary | ICD-10-CM

## 2011-05-24 DIAGNOSIS — F528 Other sexual dysfunction not due to a substance or known physiological condition: Secondary | ICD-10-CM

## 2011-05-24 DIAGNOSIS — E785 Hyperlipidemia, unspecified: Secondary | ICD-10-CM

## 2011-05-24 DIAGNOSIS — IMO0001 Reserved for inherently not codable concepts without codable children: Secondary | ICD-10-CM

## 2011-05-24 DIAGNOSIS — M25569 Pain in unspecified knee: Secondary | ICD-10-CM

## 2011-05-24 LAB — COMPREHENSIVE METABOLIC PANEL
ALT: 13 U/L (ref 0–53)
AST: 18 U/L (ref 0–37)
Albumin: 4.4 g/dL (ref 3.5–5.2)
Alkaline Phosphatase: 47 U/L (ref 39–117)
BUN: 11 mg/dL (ref 6–23)
CO2: 26 mEq/L (ref 19–32)
Calcium: 9.4 mg/dL (ref 8.4–10.5)
Chloride: 102 mEq/L (ref 96–112)
Creat: 0.74 mg/dL (ref 0.50–1.35)
Glucose, Bld: 148 mg/dL — ABNORMAL HIGH (ref 70–99)
Potassium: 4.1 mEq/L (ref 3.5–5.3)
Sodium: 139 mEq/L (ref 135–145)
Total Bilirubin: 0.4 mg/dL (ref 0.3–1.2)
Total Protein: 7.8 g/dL (ref 6.0–8.3)

## 2011-05-24 LAB — GLUCOSE, CAPILLARY: Glucose-Capillary: 186 mg/dL — ABNORMAL HIGH (ref 70–99)

## 2011-05-24 MED ORDER — TADALAFIL 10 MG PO TABS: 10.0000 mg | ORAL_TABLET | Freq: Every day | ORAL | Status: DC | PRN

## 2011-05-24 MED ORDER — GABAPENTIN 300 MG PO CAPS: 300.0000 mg | ORAL_CAPSULE | Freq: Three times a day (TID) | ORAL | Status: DC

## 2011-05-24 MED ORDER — METFORMIN HCL 500 MG PO TABS: 500.0000 mg | ORAL_TABLET | Freq: Two times a day (BID) | ORAL | Status: DC

## 2011-05-24 MED ORDER — PRAVASTATIN SODIUM 20 MG PO TABS: 20.0000 mg | ORAL_TABLET | Freq: Every day | ORAL | Status: DC

## 2011-05-24 NOTE — Progress Notes (Signed)
Subjective:   [name] male   DOB: [birth year] 46 y.o.   MRN: [name]  HPI: Keith [name] is a 46 y.o. man with many visits to the ED  Complains of sharp pain in his anterior thighs and over his knees and also on the later side of his calves. Had this pain since October. Gabapentin takes 2-3 hours to kick in, he thinks percocet 5 kills the pain right away. He has been taking gabapentin 2 100mg  pills up to 7 times a day. He says he is walking 7-10 miles a day and has lost some weight. He used to weigh as much as 360 lbs and currently weighs 240.   He stopped metformin himself about 3 months ago. Just saw Debera Lat and he reports his A1c is down from 20 to 9 today. On chart review this is correct.   Erection- having trouble getting an erection. Does not have morning time erections currently though he says after he urinates he can have one.  Past Medical History  Diagnosis Date  . HIV (human immunodeficiency virus infection)     CD4 count 100, VL 13800 (05/01/2010)  . Diabetes type 2, uncontrolled     HgA1c 17.6 (04/27/2010)  . Hypertension   . Genital warts   . Erectile dysfunction   . Chronic knee pain     right  . Osteomyelitis     h/o hand  . Diabetes mellitus   . Chronic pain    Current Outpatient Prescriptions  Medication Sig Dispense Refill  . aspirin 81 MG EC tablet Take 81 mg by mouth daily.        Marland Kitchen atenolol (TENORMIN) 50 MG tablet Take 1 tablet (50 mg total) by mouth daily.  30 tablet  5  . dapsone 100 MG tablet Take 1 tablet (100 mg total) by mouth daily.  30 tablet  5  . emtricitabine-tenofovir (TRUVADA) 200-300 MG per tablet Take 1 tablet by mouth daily.  30 tablet  5  . gabapentin (NEURONTIN) 100 MG capsule Take 100 mg by mouth 3 (three) times daily.      Marland Kitchen lisinopril (PRINIVIL,ZESTRIL) 40 MG tablet Take 1 tablet (40 mg total) by mouth daily.  30 tablet  5  . metFORMIN (GLUCOPHAGE) 1000 MG tablet Take 1 tablet (1,000 mg total) by mouth 2  (two) times daily.  60 tablet  5  . raltegravir (ISENTRESS) 400 MG tablet Take 1 tablet (400 mg total) by mouth 2 (two) times daily.  60 tablet  5  . testosterone (ANDROGEL) 50 MG/5GM GEL Place onto the skin daily. Apply one packet every day       . zidovudine (RETROVIR) 300 MG tablet Take 1 tablet (300 mg total) by mouth 2 (two) times daily.  60 tablet  5   Family History  Problem Relation Age of Onset  . Hypertension Mother   . Arthritis Father   . Hypertension Father   . Hypertension Brother   . Cancer Maternal Grandmother 71    unknown type of cancer  . Depression Paternal Grandmother    History   Social History  . Marital Status: Single    Spouse Name: N/A    Number of Children: N/A  . Years of Education: N/A   Social History Main Topics  . Smoking status: Never Smoker   . Smokeless tobacco: Never Used  . Alcohol Use: No  . Drug Use: No  . Sexually Active: Yes -- Male partner(s)  given condoms   Other Topics Concern  . None   Social History Narrative   Unemployed. Applying for disability.    Objective:  Physical Exam: Filed Vitals:   05/24/11 1349  BP: 124/88  Pulse: 114  Temp: 97.7 F (36.5 C)  TempSrc: Oral  Height: 5\' 10"  (1.778 m)  Weight: 240 lb 12.8 oz (109.226 kg)  SpO2: 100%   General: resting in bed HEENT: PERRL, EOMI, no scleral icterus Cardiac: RRR, no rubs, murmurs or gallops Pulm: clear to auscultation bilaterally, moving normal volumes of air Abd: soft, nontender, nondistended, BS present Ext: warm and well perfused, no pedal edema Neuro: alert and oriented X3, cranial nerves II-XII grossly intact  Assessment & Plan:

## 2011-05-24 NOTE — Progress Notes (Signed)
Diabetes Self-Management Training (DSMT)  Follow-Up 4 Visit  05/24/2011 [name], identified by name and date of birth, is a 46 y.o. male with Type 2 Diabetes. Year of diabetes diagnosis: 2009-2010 Other persons present: no  ASSESSMENT Patient concerns are Glycemic control and Weight control.  Height 5\' 10"  (1.778 m), weight 240 lb 12.8 oz (109.226 kg). Body mass index is 34.55 kg/(m^2). Lab Results  Component Value Date   LDLCALC 155* 03/13/2011   Lab Results  Component Value Date   HGBA1C 9.0* 03/13/2011   Medication Nutrition Monitor: not using  Labs reviewed.  DIABETES BUNDLE: A1C in past 6 months? Yes.  Less than 7%? No LDL in past year? Yes.  Less than 100 mg/dL? No Microalbumin ratio in past year? No. Patient taking ACE or ARB? Yes. Blood pressure less than 130/80? Yes. Foot exam in last year? Yes. Eye exam in past year? No.  Reminded patient to schedule eye exam. Tobacco use? No. Pneumovax? Yes Flu vaccine? Yes Asprin? Yes  Family history of diabetes: No  Support systems: unsure  Special needs: Simplified materials, Verbal instruction  Prior DM Education: Yes   Medications See Medications list. not tkaing metformin, does not think he needs it now. Suggested he discuss this with provider today He Is interested in learning more  Patients belief/attitude about diabetes: Diabetes can be controlled.  Self foot exams daily: Yes  Diabetes Complications: Neuropathy   Exercise Plan Doing walking for 60 minutesa day.   Self-Monitoring Frequency of testing: stopped testing Lunch: 186 after lunch today   Hyperglycemia: No Hypoglycemia: No   Meal Planning Some knowledge and No interest in improving   Assessment comments: Reports intake as follows: =breakfast: banana                                                                                        Lunch: 2 bananas and regular soda                          Dinner : neck bone and rice and lemonade                                                                                        Snack: potato chips Patient asking how much more weight he needs to loose: suggested 230 be his next goal. He needs podiatry for long thick toenails and thick calluses and an eye exam.   INDIVIDUAL DIABETES EDUCATION PLAN:  Medication Chronic complications Goal setting _______________________________________________________________________  Intervention TOPICS COVERED TODAY:  Medication  Reviewed patients medication for diabetes, action, purpose, timing of dose and side effects. Chronic complications  Relationship between chronic complications and blood glucose control Assessed and discussed foot care and prevention of foot problems Lipid levels, blood glucose control and  heart disease Identified and discussed with patient neuropathy current chronic complications Reviewed with patient heart disease, higher risk of, and prevention of Goal setting  Lifestyle issues that need to be addressed for better diabetes care Helped patient develop diabetes management plan for caring for feet  PATIENTS GOALS/PLAN (copy and paste in patient instructions so patient receives a copy): 1.  Learning Objective:       Know importance of foot care, eating healthy 2.  Behavioral Objective:         Medications: To improve blood glucose levels, I will take my medication as prescribed Sometimes 25% Problem Solving: To improve my blood glucose control, I will go to podiatrist when/if appointment made  Never 0% Reducing Risk: To decrease the risk for complications, I will do foot check daily  Most of the time 75%  Personalized Follow-Up Plan for Ongoing Self Management Support:  [place], family and CDE visits ______________________________________________________________________   Outcomes Expected outcomes: Demonstrated limited interest in learning.  Expect minimal changes.  Self-care Barriers: Low literacy, Lack of material resources, Coping skills, Cognitive impairment  Education material provided: no  Patient to contact team via Phone if problems or questions.  Time in: 1315     Time out: 1345  Future DSMT - 2 months   Lavella Myren, [name]

## 2011-05-24 NOTE — Patient Instructions (Addendum)
-   Metformin 500mg  twice a day - Go see the podiatrist and the ophthalmologist, if you don't hear from [facility] please call the clinic [phone] - Gabapentin three times a day, do not take more than that -Will start you on cholesterol medicine

## 2011-05-27 ENCOUNTER — Telehealth: Payer: Self-pay | Admitting: *Deleted

## 2011-05-27 NOTE — Assessment & Plan Note (Signed)
Patient's diabetes is much better controlled, he has lost quite a bit of weight. Asked him to restart metformin at half dose and go see ophthalmologist and podiatrist.

## 2011-05-27 NOTE — Assessment & Plan Note (Signed)
Patient is not to be given any further opiates since he refused to give urine at last visit. He is amenable to this. He has been taking his gabapentin 100mg  as needed up to 7 times a day. Asked him to take it regularly three times a day at 200mg  since this is starting dose. Reinforced that this prevents nerve pain and is not a pain medication to be taken on an as needed basis.

## 2011-05-27 NOTE — Assessment & Plan Note (Signed)
Patient asked for cialis, wrote him for viagra since it was less expensive. When told that medicaid doesn't cover it, he said he would likely not get it.

## 2011-05-27 NOTE — Telephone Encounter (Signed)
Call to pt to give him his Opthalmology and Podiatry appointments.  Pt is scheduled with Dr. [place] on 06/10/3011 at 2:20 PM pt to arrive by 2:05 PM.  Scheduled with [place] on 06/13/2011 at 1:30 PM to arrive by 1:15 PM.  Pt was called with appointments.  Pt would like to have appointments mailed to him.  Appointment Cards were mailed to patient.  Sander Nephew, RN 05/27/2011 2:07 PM

## 2011-06-14 IMAGING — CR DG KNEE COMPLETE 4+V*R*
5 series · 5 of 5 positions shown · non-contrast
Comparison: 03/31/2010 radiographs.

CLINICAL DATA: Anterior knee pain status post fall.

RIGHT KNEE - COMPLETE 4+ VIEW

[t knee ap right (1 of 2)]
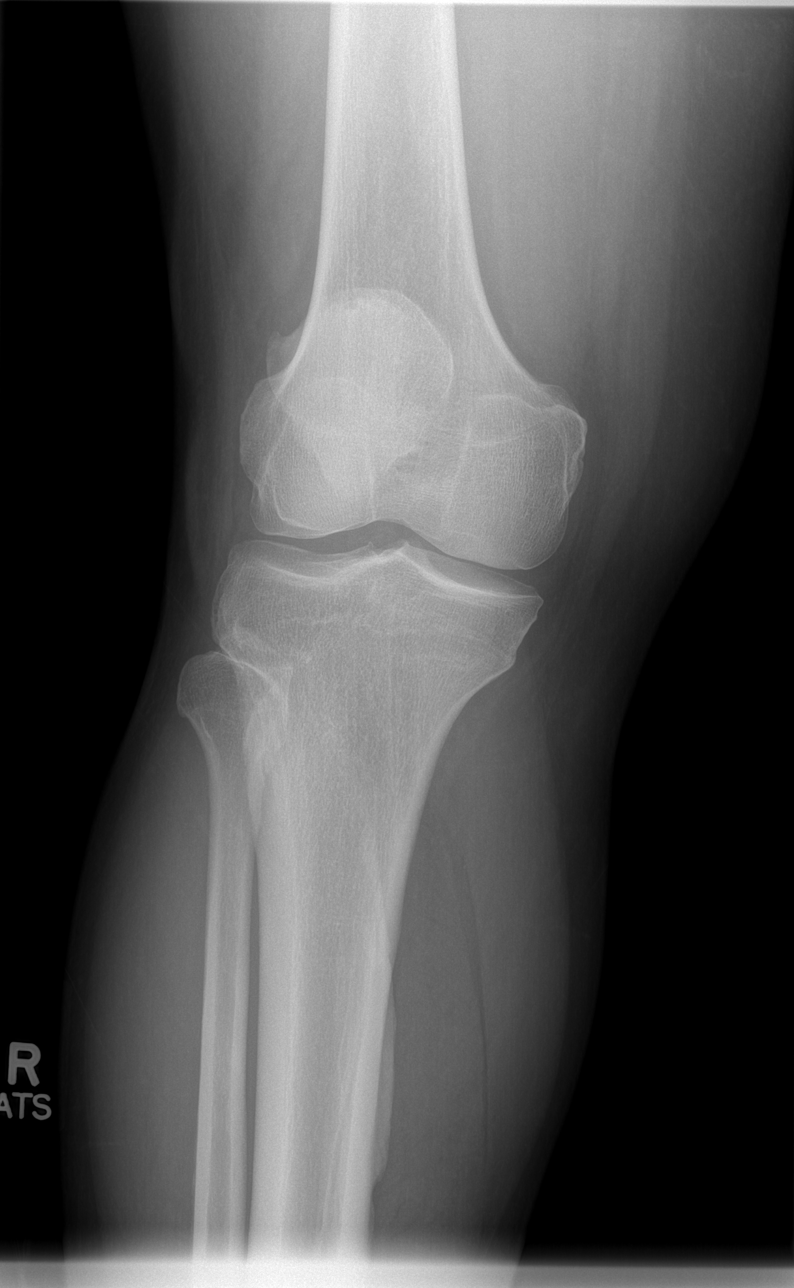

[t knee ap right (2 of 2)]
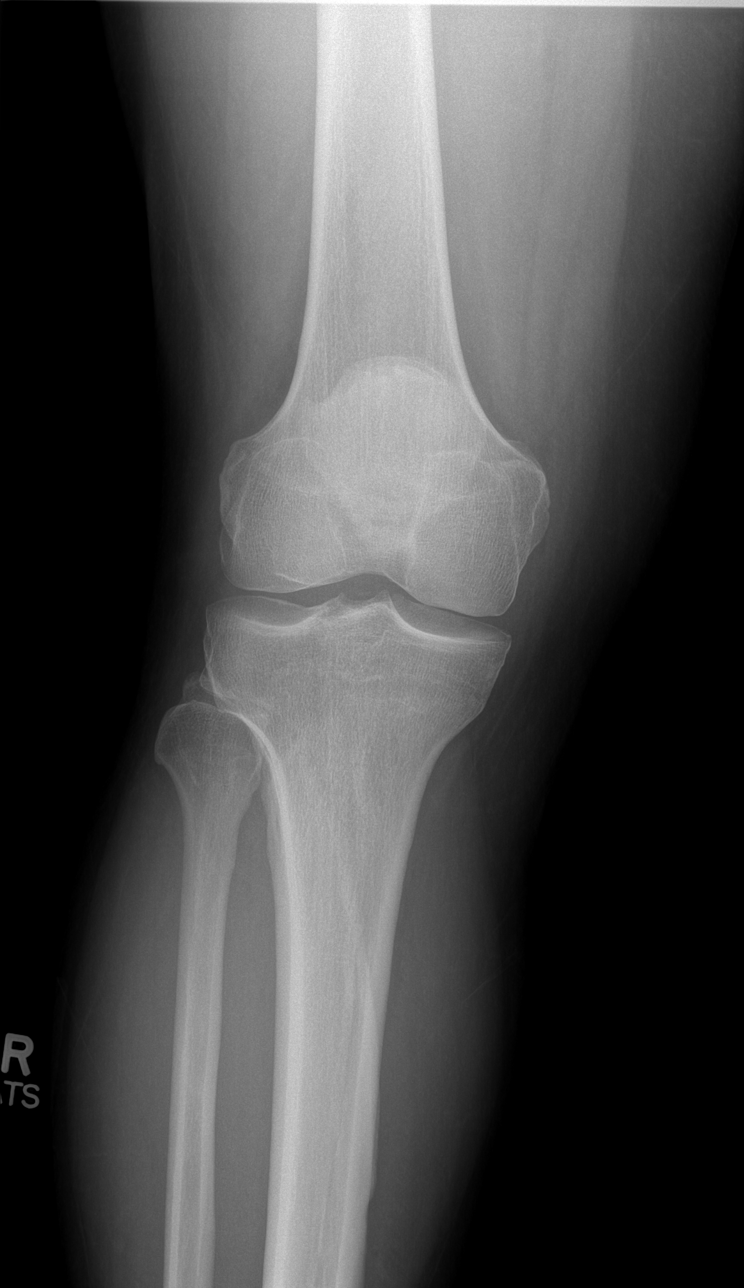

[t knee oblique right (1 of 2)]
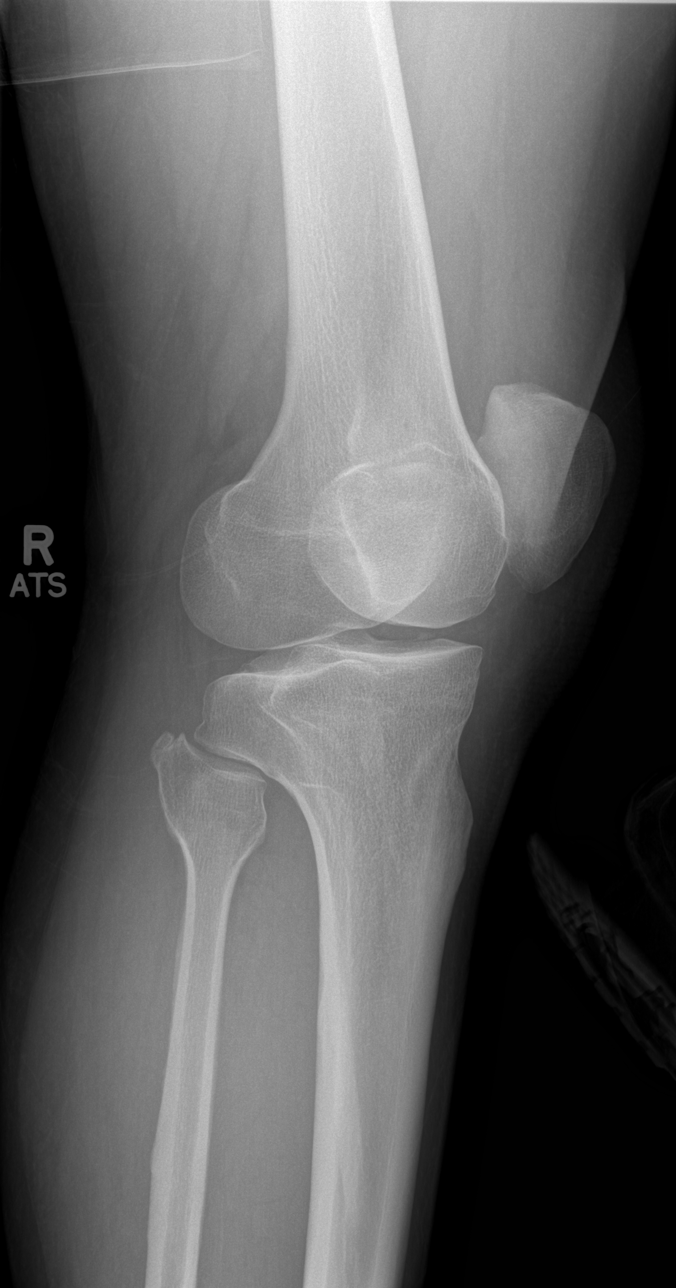

[t knee oblique right (2 of 2)]
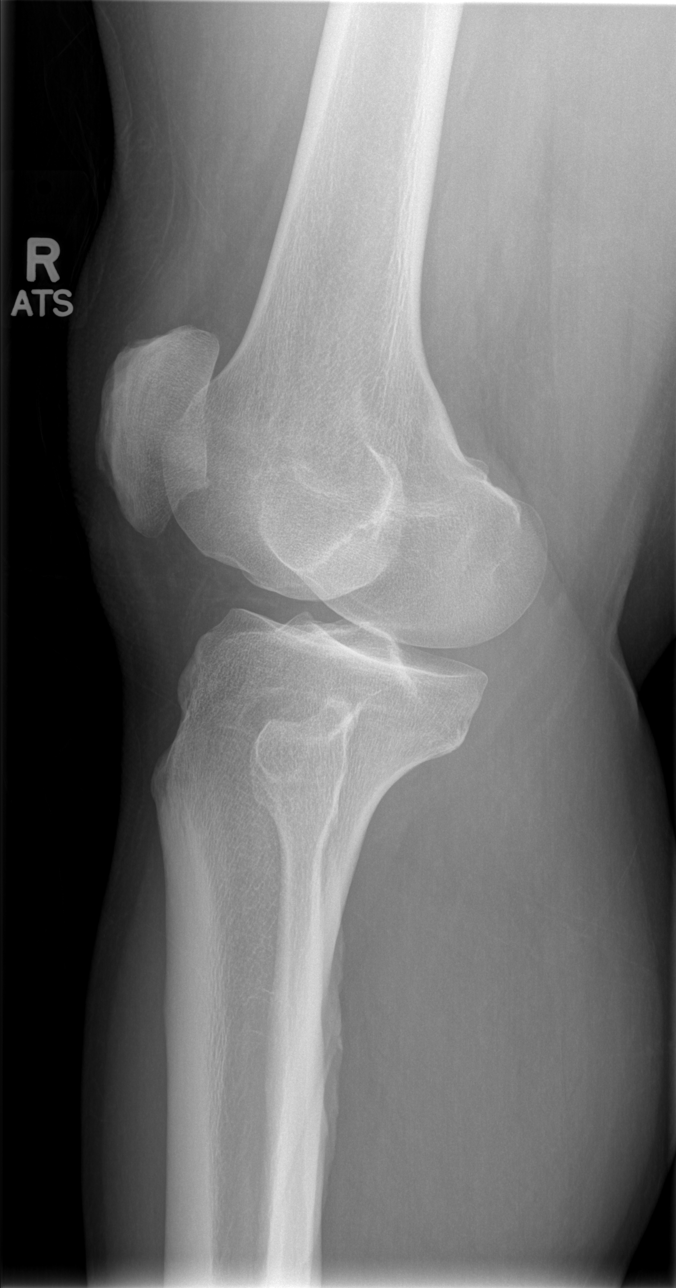

[t knee lat right]
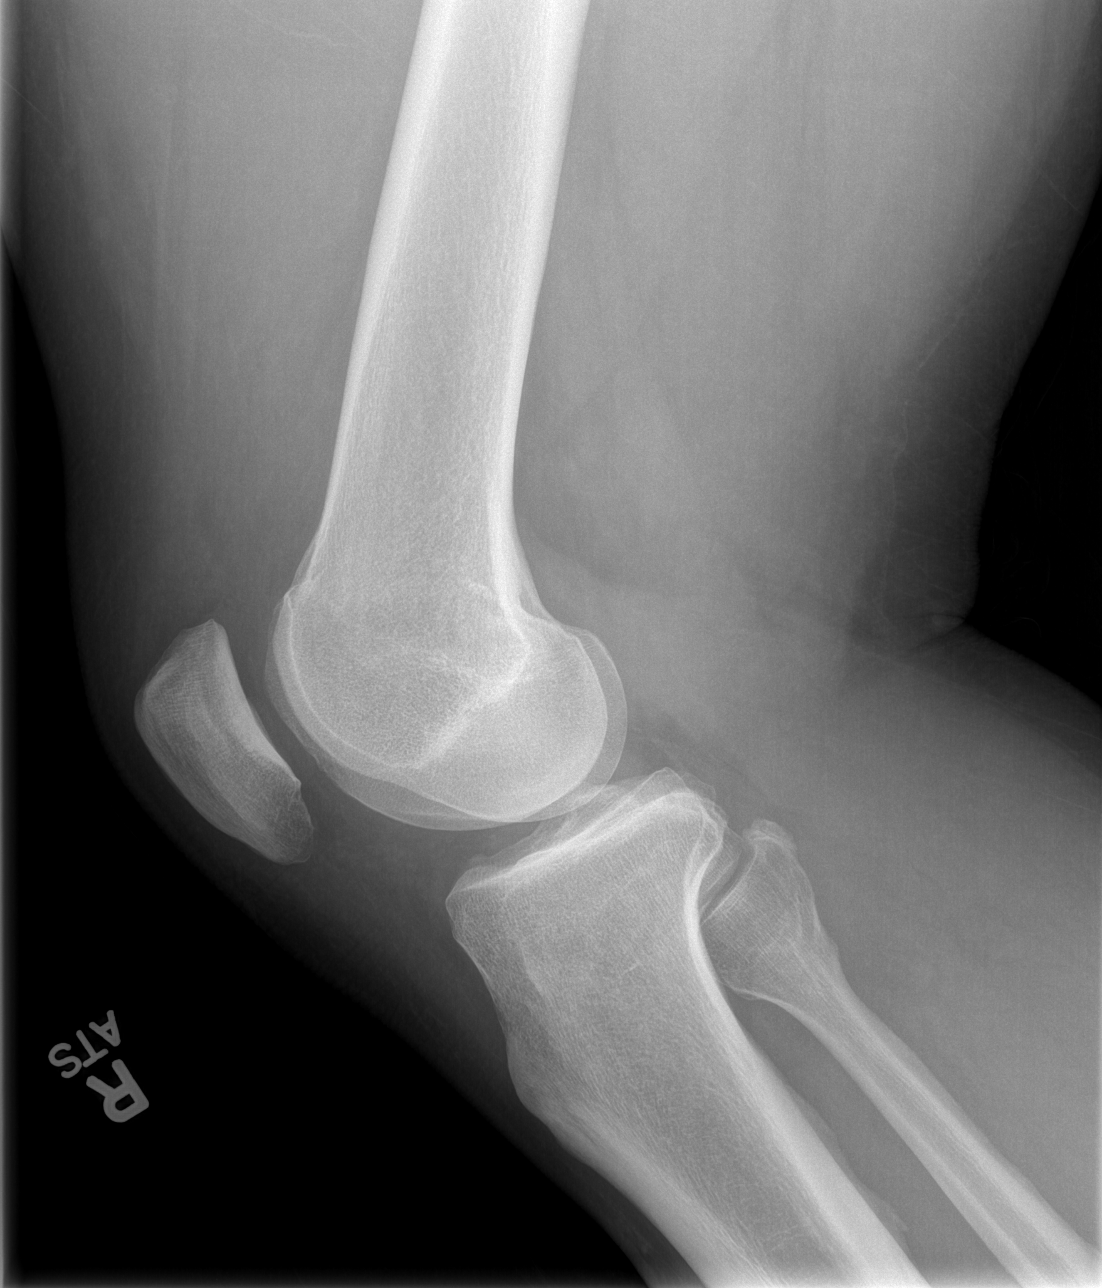

[5 of 5 positions shown; findings below may reference images not displayed]

FINDINGS: The mineralization and alignment are normal.  There is no
evidence of acute fracture or dislocation.  There are mild
tricompartmental degenerative changes.  No significant knee joint
effusion is identified.
IMPRESSION: No acute osseous findings.

## 2011-06-29 IMAGING — CR DG CHEST 2V
2 series · 2 of 2 positions shown · non-contrast
Comparison: Chest radiograph performed 05/01/2010

CLINICAL DATA: Hyperglycemia; finger infection.

CHEST - 2 VIEW

[w chest pa]
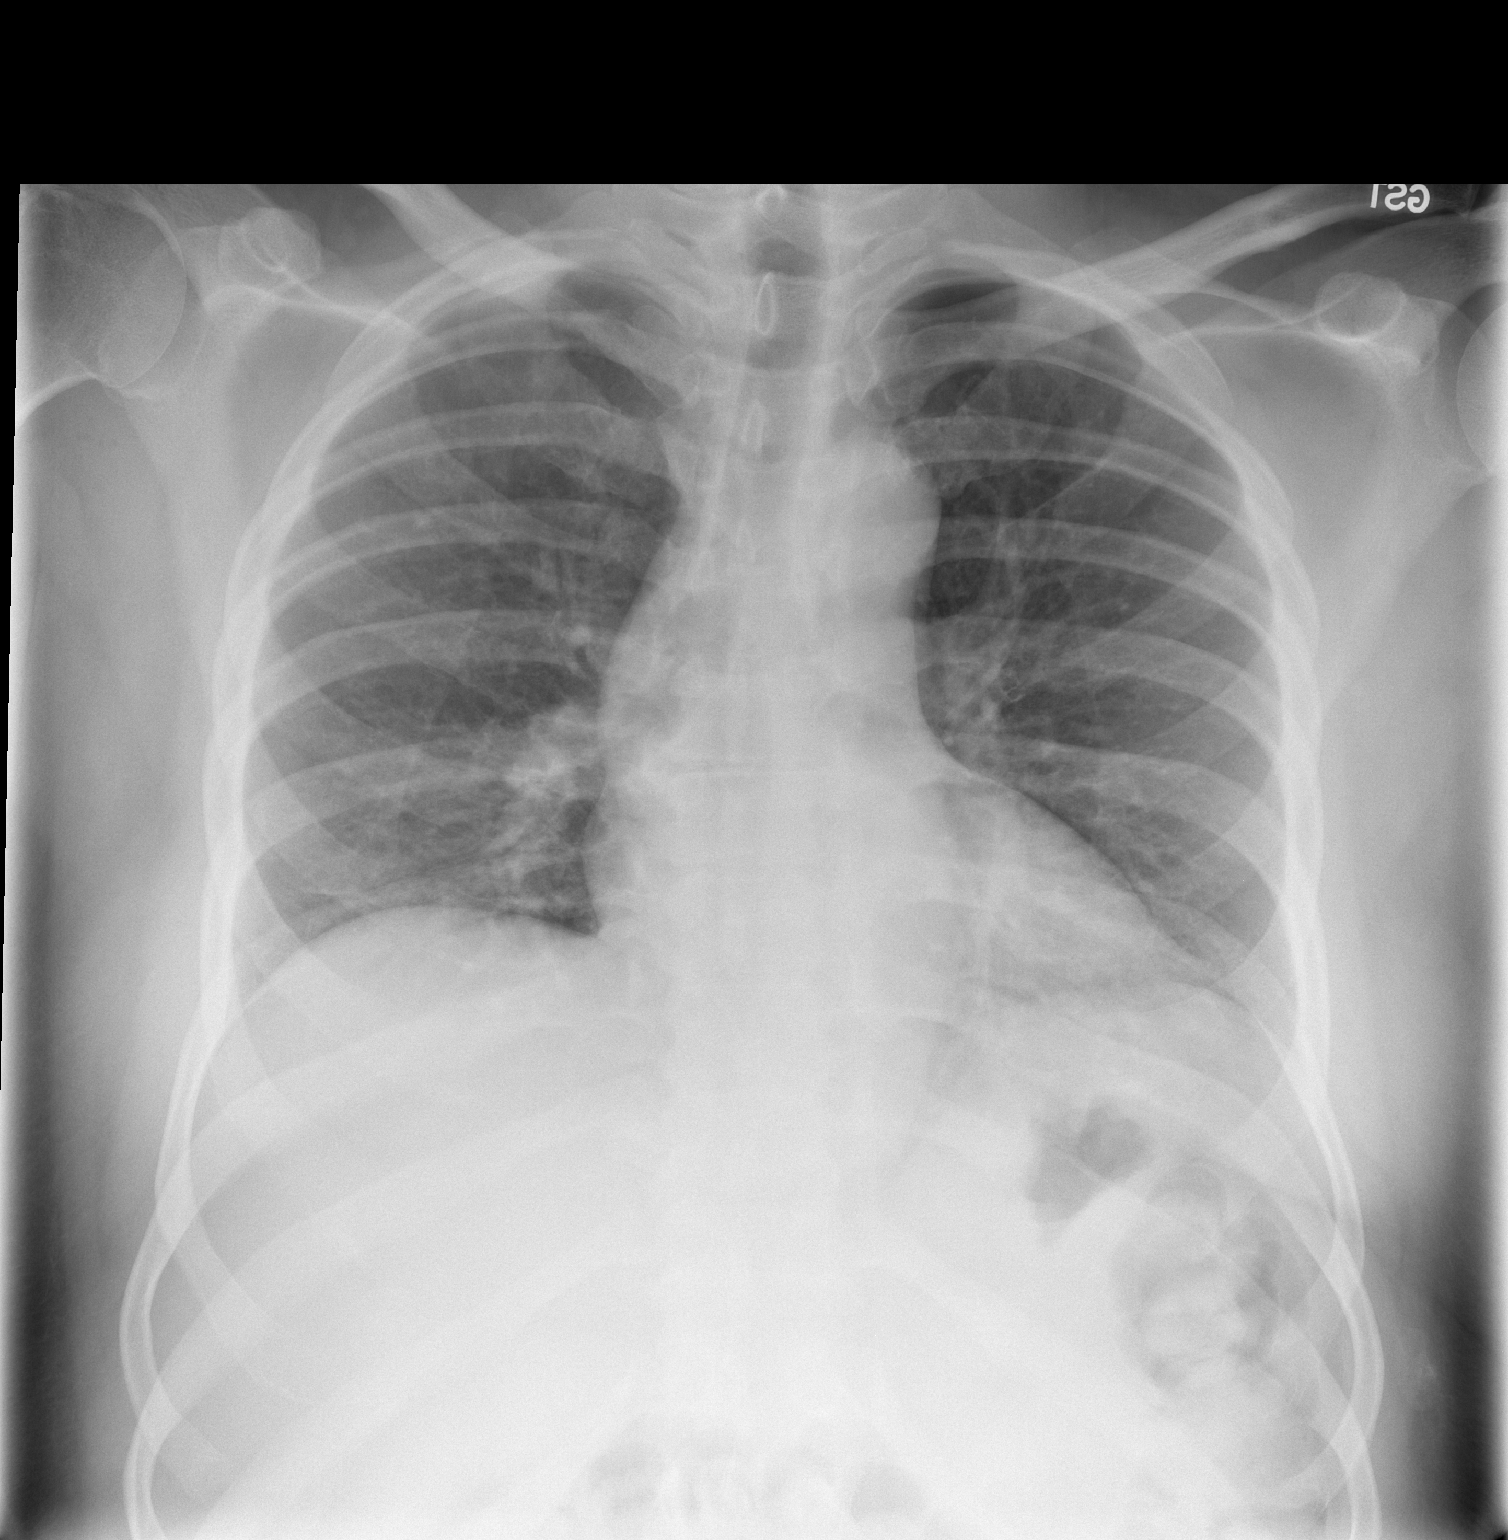

[w chest lat]
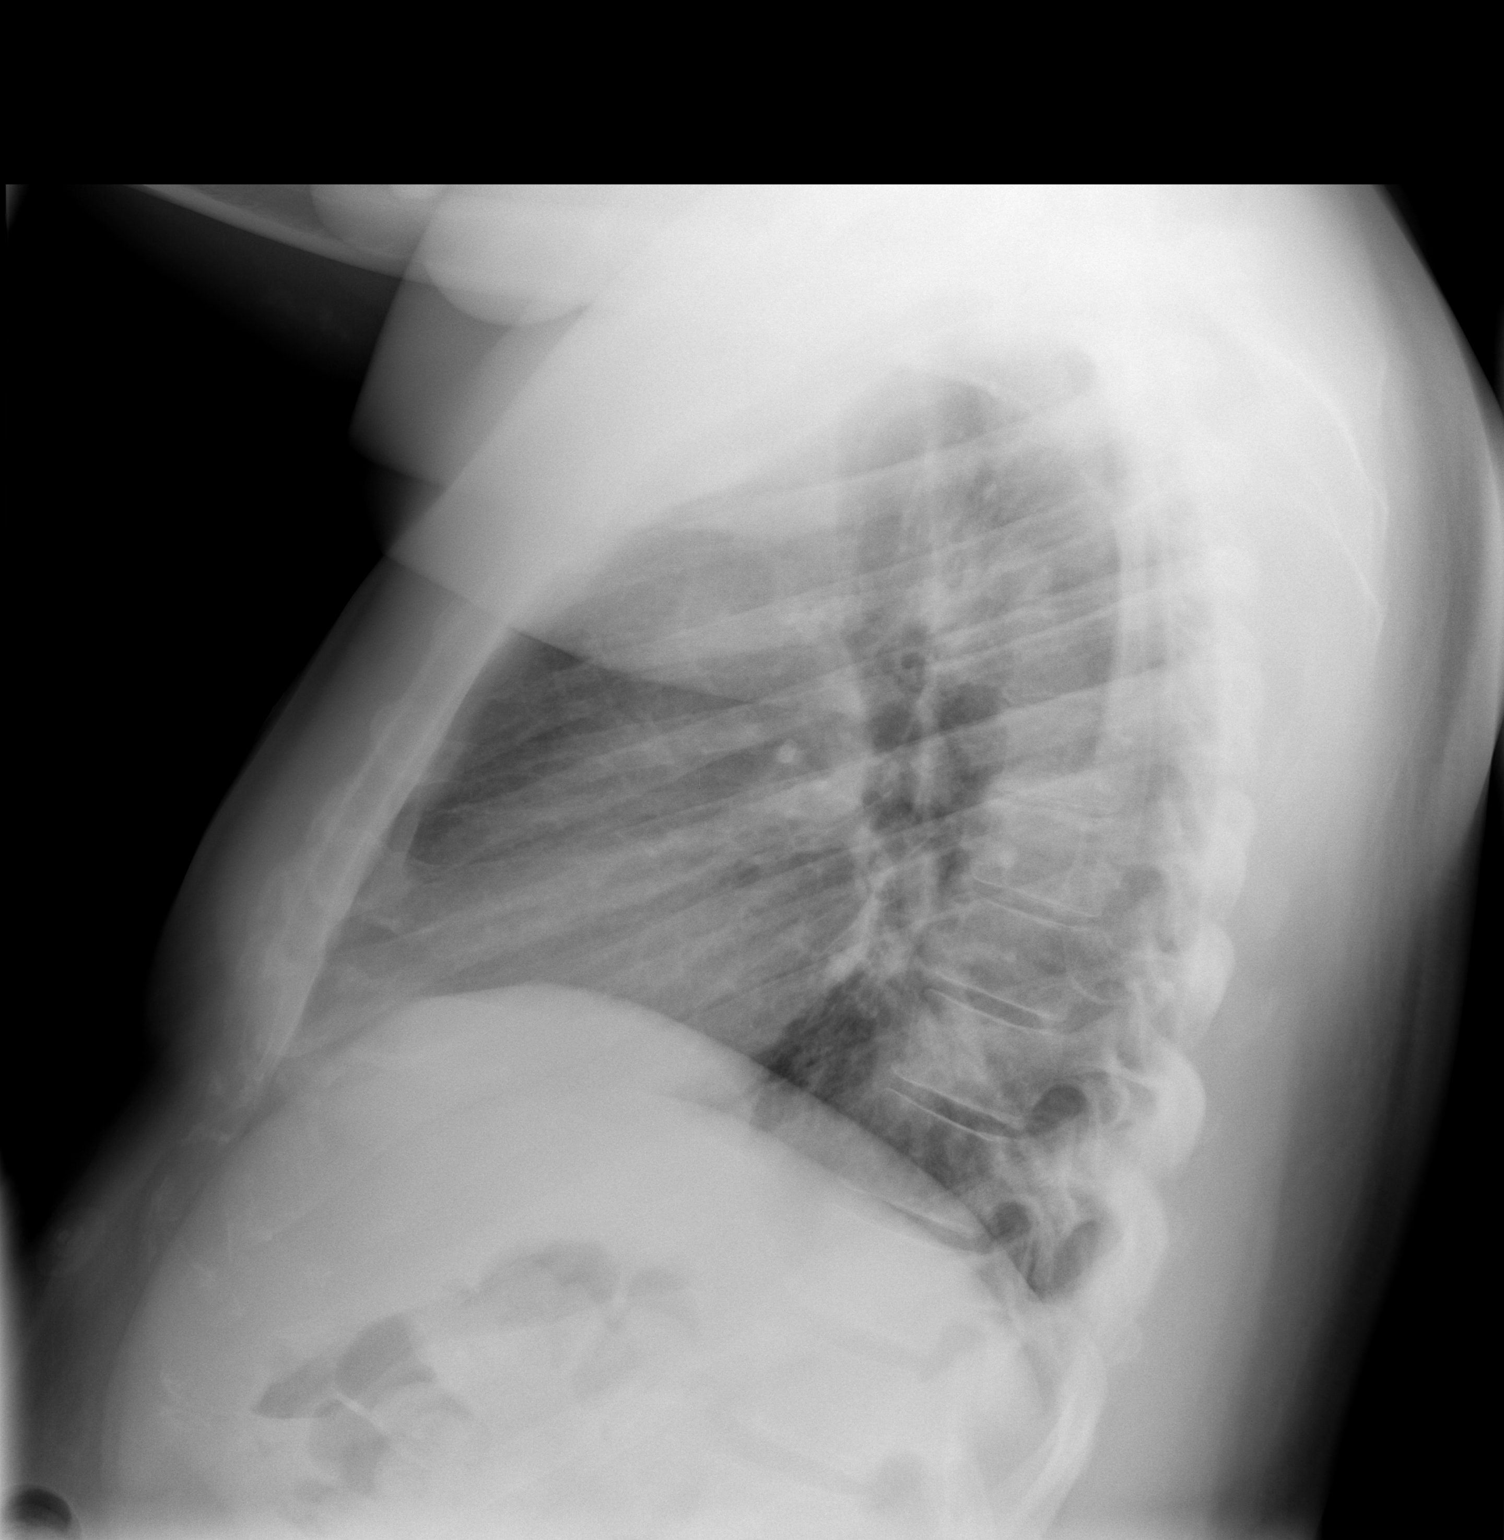

[2 of 2 positions shown; findings below may reference images not displayed]

FINDINGS: The lungs are well-aerated.  Mild bibasilar opacity may
reflect atelectasis or pneumonia.  There is no evidence of pleural
effusion or pneumothorax.

The heart is borderline normal in size; the mediastinal contour is
within normal limits.  No acute osseous abnormalities are seen.
IMPRESSION: Mild bibasilar opacity may reflect atelectasis or pneumonia.

## 2011-07-24 ENCOUNTER — Other Ambulatory Visit: Payer: Medicaid Other

## 2011-07-24 DIAGNOSIS — B20 Human immunodeficiency virus [HIV] disease: Secondary | ICD-10-CM

## 2011-07-24 DIAGNOSIS — Z113 Encounter for screening for infections with a predominantly sexual mode of transmission: Secondary | ICD-10-CM

## 2011-07-24 DIAGNOSIS — Z79899 Other long term (current) drug therapy: Secondary | ICD-10-CM

## 2011-07-24 LAB — COMPLETE METABOLIC PANEL WITH GFR
ALT: 15 U/L (ref 0–53)
AST: 22 U/L (ref 0–37)
Albumin: 4.3 g/dL (ref 3.5–5.2)
Alkaline Phosphatase: 53 U/L (ref 39–117)
BUN: 13 mg/dL (ref 6–23)
CO2: 26 mEq/L (ref 19–32)
Calcium: 9.4 mg/dL (ref 8.4–10.5)
Chloride: 101 mEq/L (ref 96–112)
Creat: 0.77 mg/dL (ref 0.50–1.35)
GFR, Est African American: >89 mL/min
GFR, Est Non African American: >89 mL/min
Glucose, Bld: 170 mg/dL — ABNORMAL HIGH (ref 70–99)
Potassium: 4.1 mEq/L (ref 3.5–5.3)
Sodium: 138 mEq/L (ref 135–145)
Total Bilirubin: 0.4 mg/dL (ref 0.3–1.2)
Total Protein: 7.9 g/dL (ref 6.0–8.3)

## 2011-07-24 LAB — LIPID PANEL
Cholesterol: 211 mg/dL — ABNORMAL HIGH (ref 0–200)
HDL: 33 mg/dL — ABNORMAL LOW (ref >39)
LDL Cholesterol: 153 mg/dL — ABNORMAL HIGH (ref 0–99)
Total CHOL/HDL Ratio: 6.4 Ratio
Triglycerides: 123 mg/dL (ref <150)
VLDL: 25 mg/dL (ref 0–40)

## 2011-07-24 LAB — CBC WITH DIFFERENTIAL/PLATELET
Basophils Absolute: 0 10*3/uL (ref 0.0–0.1)
Basophils Relative: 0 % (ref 0–1)
Eosinophils Absolute: 0 10*3/uL (ref 0.0–0.7)
Eosinophils Relative: 1 % (ref 0–5)
HCT: 38.2 % — ABNORMAL LOW (ref 39.0–52.0)
Hemoglobin: 12.9 g/dL — ABNORMAL LOW (ref 13.0–17.0)
Lymphocytes Relative: 28 % (ref 12–46)
Lymphs Abs: 1.4 10*3/uL (ref 0.7–4.0)
MCH: 27.7 pg (ref 26.0–34.0)
MCHC: 33.8 g/dL (ref 30.0–36.0)
MCV: 82.2 fL (ref 78.0–100.0)
Monocytes Absolute: 0.4 10*3/uL (ref 0.1–1.0)
Monocytes Relative: 7 % (ref 3–12)
Neutro Abs: 3.3 10*3/uL (ref 1.7–7.7)
Neutrophils Relative %: 64 % (ref 43–77)
Platelets: 199 10*3/uL (ref 150–400)
RBC: 4.65 MIL/uL (ref 4.22–5.81)
RDW: 14 % (ref 11.5–15.5)
WBC: 5.1 10*3/uL (ref 4.0–10.5)

## 2011-07-24 LAB — RPR

## 2011-07-25 LAB — T-HELPER CELL (CD4) - (RCID CLINIC ONLY)
CD4 % Helper T Cell: 2 % — ABNORMAL LOW (ref 33–55)
CD4 T Cell Abs: 30 uL — ABNORMAL LOW (ref 400–2700)

## 2011-07-26 ENCOUNTER — Emergency Department (HOSPITAL_COMMUNITY)
Admission: EM | Admit: 2011-07-26 | Discharge: 2011-07-26 | Disposition: A | Discharge status: Home / Self Care | Payer: Medicaid Other | Attending: Emergency Medicine | Admitting: Emergency Medicine

## 2011-07-26 ENCOUNTER — Encounter (HOSPITAL_COMMUNITY): Payer: Self-pay | Admitting: General Practice

## 2011-07-26 DIAGNOSIS — G8929 Other chronic pain: Secondary | ICD-10-CM | POA: Insufficient documentation

## 2011-07-26 DIAGNOSIS — IMO0001 Reserved for inherently not codable concepts without codable children: Secondary | ICD-10-CM | POA: Insufficient documentation

## 2011-07-26 DIAGNOSIS — Z21 Asymptomatic human immunodeficiency virus [HIV] infection status: Secondary | ICD-10-CM | POA: Insufficient documentation

## 2011-07-26 DIAGNOSIS — I10 Essential (primary) hypertension: Secondary | ICD-10-CM | POA: Insufficient documentation

## 2011-07-26 DIAGNOSIS — Z79899 Other long term (current) drug therapy: Secondary | ICD-10-CM | POA: Insufficient documentation

## 2011-07-26 DIAGNOSIS — M25569 Pain in unspecified knee: Secondary | ICD-10-CM | POA: Insufficient documentation

## 2011-07-26 LAB — HIV-1 RNA QUANT-NO REFLEX-BLD
HIV 1 RNA Quant: 97808 copies/mL — ABNORMAL HIGH (ref <20)
HIV-1 RNA Quant, Log: 4.99 {Log} — ABNORMAL HIGH (ref <1.30)

## 2011-07-26 NOTE — ED Notes (Signed)
Pt states he has been having left sided knee pain for several months becoming worse yesterday. Increased with ambulation and palpation, no evidence of swelling as compared with the right. Patient denies any recent injury.

## 2011-07-26 NOTE — ED Notes (Signed)
PT PRIMARY DOCTOR HAS ADDRESSED PT CHRONIC KNEE PAIN AND HAS PT ON NEURONTIN. PT STATES NOT HELPING. STATES HE HAS NOT CALLED OR FOLLOWED UP WITH HIS PRIMARY DOCTOR

## 2011-07-26 NOTE — ED Provider Notes (Signed)
History     CSN: [id number]  Arrival date & time 07/26/11  1250   First MD Initiated Contact with Patient 07/26/11 1315      Chief Complaint  Patient presents with  . Knee Pain    (Consider location/radiation/quality/duration/timing/severity/associated sxs/prior treatment) HPI  Patient who has hx of chronic knee pain that is followed by [facility] and by ortho in the past but  [facility] has stopped narcotic pain meds for chronic pain per last note presents to the ER complaining of ongoing right knee pain that he states is the same chronic knee pain that he has had for years and that has been evaluated in the ER numerous times. He denies knee injury to knee. Patient did not take ibuprofen or tylenol PTA. He did not contact [facility] about follow up however states that when he was seen in February they were able to stop insulin due to improved blood sugars. He denies swelling, redness or heat to knee.   Past Medical History  Diagnosis Date  . HIV (human immunodeficiency virus infection)     CD4 count 100, VL 13800 (05/01/2010)  . Diabetes type 2, uncontrolled     HgA1c 17.6 (04/27/2010)  . Hypertension   . Genital warts   . Erectile dysfunction   . Chronic knee pain     right  . Osteomyelitis     h/o hand  . Diabetes mellitus   . Chronic pain     History reviewed. No pertinent past surgical history.  Family History  Problem Relation Age of Onset  . Hypertension Mother   . Arthritis Father   . Hypertension Father   . Hypertension Brother   . Cancer Maternal Grandmother 41    unknown type of cancer  . Depression Paternal Grandmother     History  Substance Use Topics  . Smoking status: Never Smoker   . Smokeless tobacco: Never Used  . Alcohol Use: No      Review of Systems  All other systems reviewed and are negative.    Allergies  Sulfa antibiotics  Home Medications   Current Outpatient Rx  Name Route Sig Dispense Refill  . ASPIRIN 81 MG PO TBEC Oral Take 81 mg by mouth  daily.      . ATENOLOL 50 MG PO TABS Oral Take 1 tablet (50 mg total) by mouth daily. 30 tablet 5  . DAPSONE 100 MG PO TABS Oral Take 1 tablet (100 mg total) by mouth daily. 30 tablet 5  . EMTRICITABINE-TENOFOVIR 200-300 MG PO TABS Oral Take 1 tablet by mouth daily. 30 tablet 5  . GABAPENTIN 300 MG PO CAPS Oral Take 1 capsule (300 mg total) by mouth 3 (three) times daily. 180 capsule 4  . LISINOPRIL 40 MG PO TABS Oral Take 1 tablet (40 mg total) by mouth daily. 30 tablet 5  . METFORMIN HCL 500 MG PO TABS Oral Take 1 tablet (500 mg total) by mouth 2 (two) times daily with a meal. 120 tablet 4  . PRAVASTATIN SODIUM 20 MG PO TABS Oral Take 20 mg by mouth every evening.    Marland Kitchen RALTEGRAVIR POTASSIUM 400 MG PO TABS Oral Take 1 tablet (400 mg total) by mouth 2 (two) times daily. 60 tablet 5  . TESTOSTERONE 50 MG/5GM TD GEL Transdermal Place 5 g onto the skin daily.     Marland Kitchen ZIDOVUDINE 300 MG PO TABS Oral Take 1 tablet (300 mg total) by mouth 2 (two) times daily. 60 tablet 5  .  TADALAFIL 10 MG PO TABS Oral Take 1 tablet (10 mg total) by mouth daily as needed for erectile dysfunction. 10 tablet 0    BP 113/73  Pulse 102  Temp(Src) 98.2 F (36.8 C) (Oral)  Resp 18  SpO2 98%  Physical Exam  Nursing note and vitals reviewed. Constitutional: He is oriented to person, place, and time. He appears well-developed and well-nourished. No distress.  HENT:  Head: Normocephalic and atraumatic.  Eyes: Conjunctivae are normal.  Neck: Normal range of motion. Neck supple.  Musculoskeletal: Normal range of motion. He exhibits tenderness. He exhibits no edema.       TTP of entire right knee but FROM without crepitous and no swelling or skin changes. No heat.   Neurological: He is alert and oriented to person, place, and time.  Skin: Skin is warm and dry. No rash noted. He is not diaphoretic. No erythema.  Psychiatric: He has a normal mood and affect.    ED Course  Procedures (including critical care time)  Labs  Reviewed - No data to display No results found.   1. Chronic knee pain       MDM  Patient has chronic knee pain without any other complaint stating that this is long-standing knee pain is not changed but ongoing. I've spoken with the outpatient clinics who have established him a followup appointment for April 26 at 2:15 with Dr. Kelton Pillar. Patient is agreeable to following up with outpatient clinics. There no signs or symptoms of an acute knee problem        Eben Burow, PA 07/26/11 1407

## 2011-07-26 NOTE — Discharge Instructions (Signed)
You may continue to alternate between Tylenol and ibuprofen as needed for pain. Use ice for additional pain relief. An appointment has been set up for you with outpatient clinics on April 26, Friday at 2:15. Arrive 15 minutes early. Return to emergency department for emergent changing or worsening of symptoms otherwise you need to maintain close followup with your primary care provider for ongoing management of chronic pain and her diabetes management.

## 2011-07-26 NOTE — ED Provider Notes (Signed)
Medical screening examination/treatment/procedure(s) were performed by non-physician practitioner and as supervising physician I was immediately available for consultation/collaboration.   Alfonzo Feller, DO 07/26/11 2157

## 2011-08-02 ENCOUNTER — Encounter: Payer: Medicaid Other | Admitting: Internal Medicine

## 2011-08-07 ENCOUNTER — Encounter: Payer: Self-pay | Admitting: Infectious Diseases

## 2011-08-07 ENCOUNTER — Other Ambulatory Visit: Payer: Self-pay | Admitting: Infectious Diseases

## 2011-08-07 ENCOUNTER — Ambulatory Visit (INDEPENDENT_AMBULATORY_CARE_PROVIDER_SITE_OTHER): Payer: Medicaid Other | Admitting: Infectious Diseases

## 2011-08-07 VITALS — BP 143/97 | HR 92 | Temp 97.8°F | Ht 69.0 in | Wt 253.0 lb

## 2011-08-07 DIAGNOSIS — IMO0001 Reserved for inherently not codable concepts without codable children: Secondary | ICD-10-CM

## 2011-08-07 DIAGNOSIS — IMO0002 Reserved for concepts with insufficient information to code with codable children: Secondary | ICD-10-CM

## 2011-08-07 DIAGNOSIS — G8929 Other chronic pain: Secondary | ICD-10-CM | POA: Insufficient documentation

## 2011-08-07 DIAGNOSIS — B20 Human immunodeficiency virus [HIV] disease: Secondary | ICD-10-CM

## 2011-08-07 DIAGNOSIS — M79606 Pain in leg, unspecified: Secondary | ICD-10-CM

## 2011-08-07 DIAGNOSIS — E1165 Type 2 diabetes mellitus with hyperglycemia: Secondary | ICD-10-CM

## 2011-08-07 DIAGNOSIS — M79609 Pain in unspecified limb: Secondary | ICD-10-CM

## 2011-08-07 LAB — DRUG SCREEN, URINE
Amphetamine Screen, Ur: NEGATIVE
Barbiturate Quant, Ur: NEGATIVE
Benzodiazepines.: NEGATIVE
Cocaine Metabolites: NEGATIVE
Creatinine,U: 241.92 mg/dL
Marijuana Metabolite: NEGATIVE
Methadone: NEGATIVE
Opiates: NEGATIVE
Phencyclidine (PCP): NEGATIVE
Propoxyphene: NEGATIVE

## 2011-08-07 MED ORDER — RITONAVIR 100 MG PO TABS: 100.0000 mg | ORAL_TABLET | Freq: Every day | ORAL | Status: DC

## 2011-08-07 MED ORDER — DARUNAVIR ETHANOLATE 800 MG PO TABS: 800.0000 mg | ORAL_TABLET | Freq: Every day | ORAL | Status: DC

## 2011-08-07 NOTE — Progress Notes (Signed)
  Subjective:    [name], male    DOB: 02/10/1966, 46 y.o.   MRN: [name]  HPI 46 yo M with HIV+, obesity and now wit 70# wt loss (has been exercising, watching diet). His DM2 has improved and has been able to come off insulin.  Previously noted to have difficulties adherence, last med ISN/TRV/AZT. He is not to receive narcotics in clinic as he refused to give urine at previous visit to IM clinic. Asks for "perc 5" today.  HIV 1 RNA Quant (copies/mL)  Date Value  07/24/2011 97808*  03/13/2011 225*  10/03/2010 30300*     CD4 T Cell Abs (cmm)  Date Value  07/24/2011 30*  03/13/2011 80*  11/03/2010 60*    Had genotype May and June 2012 both (naive).  Complains of R leg pain. States his DM is doing well as he has lost 100#. Needs  To f/u in IM clinic as he states he has cataracts (actually being seen by optho today).  States he has been off ART for 1 month, trying to get disability, has been denied, is appealing. Would ask family for help with his HIV meds but they are unaware.   Review of Systems  Constitutional: Negative for unexpected weight change.  Eyes: Positive for visual disturbance.  Respiratory: Negative for chest tightness.   Cardiovascular: Negative for chest pain.  Gastrointestinal: Negative for diarrhea and constipation.  Genitourinary: Negative for dysuria.  Musculoskeletal: Positive for myalgias and arthralgias.  Neurological: Negative for headaches.       Objective:   Physical Exam  Constitutional: He appears well-developed and well-nourished.  HENT:  Mouth/Throat: No oropharyngeal exudate.  Eyes: EOM are normal. Pupils are equal, round, and reactive to light.  Neck: Neck supple.  Cardiovascular: Normal rate, regular rhythm and normal heart sounds.   Pulmonary/Chest: Effort normal and breath sounds normal.  Abdominal: Soft. Bowel sounds are normal. There is no tenderness.  Musculoskeletal:       Feet:  Lymphadenopathy:    He has no  cervical adenopathy.  Neurological: A sensory deficit is present.       Decreased light touch in both feet          Assessment & Plan:

## 2011-08-07 NOTE — Assessment & Plan Note (Signed)
Greatly appreciate IM assistance.

## 2011-08-07 NOTE — Progress Notes (Signed)
Addended by: Dolan Amen D on: 08/07/2011 12:17 PM   Modules accepted: Orders

## 2011-08-07 NOTE — Assessment & Plan Note (Signed)
Will check his UDS before giving him any narcotics.

## 2011-08-07 NOTE — Assessment & Plan Note (Addendum)
He has 3TC resistance and is non-adherent to his meds. Will have him seen by medication assistance as well as adherence counselor.  Will refer him to dental. Will change him to DRVr/ISN/TRV. Could start him on OI prophylaxis but doutb he will take those either. rtc in 3 months after above evals.

## 2011-08-09 ENCOUNTER — Other Ambulatory Visit: Payer: Self-pay | Admitting: *Deleted

## 2011-08-09 NOTE — Telephone Encounter (Signed)
He asked if his med had been sent to the pharmacy. He did not know the name of the med. He has not called the pharmacy yet. I told him all meds appear to be current. He will call them or go by.  He also wanted pain meds. States he did the drug screen earlier

## 2011-08-13 ENCOUNTER — Telehealth: Payer: Self-pay | Admitting: *Deleted

## 2011-08-13 NOTE — Telephone Encounter (Signed)
Patient called advised he was here on Friday 08/09/11. And he wants to know if his pain meds are ready. advided he needs the Percocet 5/500 the same he got when he was in the hospital. Advised the patient there were not on his current medication list and we would have to call the provider to get the approval. Advised him that there was nothing in the note about which med to give the patietn either. Advised him that someone would get with the provider and give him a call back once he has made a decision.

## 2011-08-13 NOTE — Telephone Encounter (Signed)
I left a message telling him I will call him tomorrow after I speak with the md

## 2011-08-14 ENCOUNTER — Telehealth: Payer: Self-pay | Admitting: *Deleted

## 2011-08-14 NOTE — Telephone Encounter (Signed)
[name], RN with [facility] called stating he went to do a home visit with patient and informed him he now has Aids, not Hiv.  Patient seemed surprised and may discuss with MD at next office visit.  [name] also noted that patient has not been taking his HIV meds regularly as the Rx had a date of 2011 on it and he also only had two Rxs, so it appears he is not taking his meds for diabetes either. [name] will attempt to work with patient to provide him with medication adherence. Myrtis Hopping CMA

## 2011-08-14 NOTE — Telephone Encounter (Signed)
Per Dr. [name] he declined filling his pain Rx, because he has a PCP.  Patient advised. Myrtis Hopping CMA

## 2011-08-20 ENCOUNTER — Other Ambulatory Visit: Payer: Self-pay | Admitting: *Deleted

## 2011-08-20 DIAGNOSIS — I10 Essential (primary) hypertension: Secondary | ICD-10-CM

## 2011-08-20 MED ORDER — ATENOLOL 50 MG PO TABS: 50.0000 mg | ORAL_TABLET | Freq: Every day | ORAL | Status: DC

## 2011-08-20 MED ORDER — LISINOPRIL 40 MG PO TABS: 40.0000 mg | ORAL_TABLET | Freq: Every day | ORAL | Status: DC

## 2011-08-20 MED ORDER — ASPIRIN 81 MG PO TBEC: 81.0000 mg | DELAYED_RELEASE_TABLET | Freq: Every day | ORAL | Status: DC

## 2011-09-13 ENCOUNTER — Telehealth: Payer: Self-pay | Admitting: Dietician

## 2011-09-13 NOTE — Telephone Encounter (Signed)
Called patient for diabetes follow up . Transferred to front office to make doctor and CDE appointments

## 2011-09-19 ENCOUNTER — Ambulatory Visit (INDEPENDENT_AMBULATORY_CARE_PROVIDER_SITE_OTHER): Payer: Medicaid Other | Admitting: Dietician

## 2011-09-19 VITALS — Wt 265.5 lb

## 2011-09-19 DIAGNOSIS — IMO0002 Reserved for concepts with insufficient information to code with codable children: Secondary | ICD-10-CM

## 2011-09-19 DIAGNOSIS — E1165 Type 2 diabetes mellitus with hyperglycemia: Secondary | ICD-10-CM

## 2011-09-19 DIAGNOSIS — IMO0001 Reserved for inherently not codable concepts without codable children: Secondary | ICD-10-CM

## 2011-09-19 LAB — POCT GLYCOSYLATED HEMOGLOBIN (HGB A1C): Hemoglobin A1C: 7.7

## 2011-09-19 LAB — GLUCOSE, CAPILLARY: Glucose-Capillary: 221 mg/dL — ABNORMAL HIGH (ref 70–99)

## 2011-09-19 NOTE — Progress Notes (Signed)
Diabetes Self-Management Training (DSMT)  Follow-Up 4 Visit  09/19/2011 Mr. Jebediah Magner, identified by name and date of birth, is a 46 y.o. male with Type 2 Diabetes.  ASSESSMENT Patient concerns are Glycemic control.  Weight 265 lb 8 oz (120.43 kg). There is no height on file to calculate BMI. Lab Results  Component Value Date   LDLCALC 153* 07/24/2011   Lab Results  Component Value Date   HGBA1C 9.0* 03/13/2011   Labs reviewed.   Medications See Medications list. Is taking as prescribed without problems Has adequate knowledge    Exercise Plan Doing walking for 60 minutesa week.walks for transportation and on weekend at [place]  Monitor: not self monitoring at home.Patient reports case manager checked one time ans it was 125mg /dl. Hyperglycemia: No Hypoglycemia: No   Meal Planning patient  wanted to leave to go to another doctor appointment, so we decided to discuss at a rescheduled appointment   Assessment comments:  Consider decreasing a1C goal to 8 or 9% instead of 10% since he had achieved this goal.    INDIVIDUAL DIABETES EDUCATION PLAN:  Goal setting Psychosocial adjustment _______________________________________________________________________  Intervention TOPICS COVERED TODAY:  Goal setting  encouraged patient to continue weight loss efforts Psychosocial adjustment  Helped patient identify a support system for diabetes management  PATIENTS GOALS/PLAN (copy and paste in patient instructions so patient receives a copy): 1.  Learning Objective:       State where to go for diabetes support 2.  Behavioral Objective:         Healthy Coping: For help with diabetes control, I will continue to attend appointments using A1C to monitor progress  Most of the time 75%  Personalized Follow-Up Plan for Ongoing Self Management Support:  [place], friends, family and CDE visits and case  worker ______________________________________________________________________   Outcomes Expected outcomes: Demonstrated interest in learning.Expect positive changes in lifestyle. Self-care Barriers: Low literacy, Lack of transportation, Lack of material resources, Substance Abuse, Coping skills Education material provided: no Patient to contact team via Phone to give patient A1C results and schedule followup Time in: 0930     Time out: 0945 Future DSMT - 4-6 wks   Bryar Rennie, [name]

## 2011-10-12 ENCOUNTER — Other Ambulatory Visit: Payer: Self-pay | Admitting: Internal Medicine

## 2011-10-16 ENCOUNTER — Emergency Department (HOSPITAL_COMMUNITY): Payer: Medicaid Other

## 2011-10-16 ENCOUNTER — Encounter (HOSPITAL_COMMUNITY): Payer: Self-pay

## 2011-10-16 ENCOUNTER — Emergency Department (HOSPITAL_COMMUNITY)
Admission: EM | Admit: 2011-10-16 | Discharge: 2011-10-16 | Disposition: A | Discharge status: Home / Self Care | Payer: Medicaid Other | Attending: Emergency Medicine | Admitting: Emergency Medicine

## 2011-10-16 DIAGNOSIS — Z21 Asymptomatic human immunodeficiency virus [HIV] infection status: Secondary | ICD-10-CM | POA: Insufficient documentation

## 2011-10-16 DIAGNOSIS — W19XXXA Unspecified fall, initial encounter: Secondary | ICD-10-CM

## 2011-10-16 DIAGNOSIS — G8929 Other chronic pain: Secondary | ICD-10-CM | POA: Insufficient documentation

## 2011-10-16 DIAGNOSIS — R739 Hyperglycemia, unspecified: Secondary | ICD-10-CM

## 2011-10-16 DIAGNOSIS — I10 Essential (primary) hypertension: Secondary | ICD-10-CM | POA: Insufficient documentation

## 2011-10-16 DIAGNOSIS — M25569 Pain in unspecified knee: Secondary | ICD-10-CM | POA: Insufficient documentation

## 2011-10-16 DIAGNOSIS — Z79899 Other long term (current) drug therapy: Secondary | ICD-10-CM | POA: Insufficient documentation

## 2011-10-16 DIAGNOSIS — E1169 Type 2 diabetes mellitus with other specified complication: Secondary | ICD-10-CM | POA: Insufficient documentation

## 2011-10-16 LAB — POCT I-STAT, CHEM 8
BUN: 18 mg/dL (ref 6–23)
Calcium, Ion: 1.28 mmol/L — ABNORMAL HIGH (ref 1.12–1.23)
Chloride: 97 mEq/L (ref 96–112)
Creatinine, Ser: 0.9 mg/dL (ref 0.50–1.35)
Glucose, Bld: 358 mg/dL — ABNORMAL HIGH (ref 70–99)
HCT: 42 % (ref 39.0–52.0)
Hemoglobin: 14.3 g/dL (ref 13.0–17.0)
Potassium: 4.3 mEq/L (ref 3.5–5.1)
Sodium: 134 mEq/L — ABNORMAL LOW (ref 135–145)
TCO2: 27 mmol/L (ref 0–100)

## 2011-10-16 LAB — GLUCOSE, CAPILLARY: Glucose-Capillary: 368 mg/dL — ABNORMAL HIGH (ref 70–99)

## 2011-10-16 MED ORDER — METFORMIN HCL 500 MG PO TABS
500.0000 mg | ORAL_TABLET | Freq: Once | ORAL | Status: AC
  Administered 2011-10-16: 500 mg via ORAL
  Filled 2011-10-16: qty 1

## 2011-10-16 MED ORDER — OXYCODONE-ACETAMINOPHEN 5-325 MG PO TABS: 2.0000 | ORAL_TABLET | ORAL | Status: AC | PRN

## 2011-10-16 MED ORDER — OXYCODONE-ACETAMINOPHEN 5-325 MG PO TABS
1.0000 | ORAL_TABLET | Freq: Once | ORAL | Status: AC
  Administered 2011-10-16: 1 via ORAL
  Filled 2011-10-16: qty 1

## 2011-10-16 NOTE — ED Notes (Signed)
Pt here with hyperglycemia and knee pain denies injury.

## 2011-10-16 NOTE — ED Provider Notes (Addendum)
History    This chart was scribed for [name], MD, MD by Rhae Lerner. The patient was seen in room TR09C and the patient's care was started at 3:54PM.   CSN: [id number]  Arrival date & time 10/16/11  1221   First MD Initiated Contact with Patient 10/16/11 1525      Chief Complaint  Patient presents with  . Knee Pain  . Hyperglycemia    (Consider location/radiation/quality/duration/timing/severity/associated sxs/prior treatment) The history is provided by the patient.   Keith [name] is a 46 y.o. male who presents to the Emergency Department complaining of moderate left knee pain onset 4 days ago and hyperglycemia. Pt reports that he was playing basketball 4 days ago and landed on his knee. Pt rates pain is 8/10. Denies numbness and tingling in left foot. Pt reports taking BC and cold fat back meat with minor relief. Pt reports that he normally has high blood glucose. He reports his hem A1C count usually is 19 but reports it has decreased to 6. He does not take insulin anymore due to being taken off. Reports that his diet is not as good as it should be. Reports that he had bagel this morning for breakfast. Pt reports taking metformin for diabetes. PCP is outpatient clinic at [facility].    Earna Coder, RN 10/16/2011 12:26     Pt here with hyperglycemia and knee pain denies injury.    Past Medical History  Diagnosis Date  . HIV (human immunodeficiency virus infection)     CD4 count 100, VL 13800 (05/01/2010)  . Diabetes type 2, uncontrolled     HgA1c 17.6 (04/27/2010)  . Hypertension   . Genital warts   . Erectile dysfunction   . Chronic knee pain     right  . Osteomyelitis     h/o hand  . Diabetes mellitus   . Chronic pain     No past surgical history on file.  Family History  Problem Relation Age of Onset  . Hypertension Mother   . Arthritis Father   . Hypertension Father   . Hypertension Brother   . Cancer Maternal Grandmother 8   unknown type of cancer  . Depression Paternal Grandmother     History  Substance Use Topics  . Smoking status: Never Smoker   . Smokeless tobacco: Never Used  . Alcohol Use: No    Review of Systems  All other systems reviewed and are negative.  10 Systems reviewed and all are negative for acute change except as noted in the HPI.  Allergies  Sulfa antibiotics  Home Medications   Current Outpatient Rx  Name Route Sig Dispense Refill  . ASPIRIN 81 MG PO TBEC Oral Take 1 tablet (81 mg total) by mouth daily. 30 tablet 5  . ATENOLOL 50 MG PO TABS Oral Take 1 tablet (50 mg total) by mouth daily. 30 tablet 5  . DAPSONE 100 MG PO TABS Oral Take 1 tablet (100 mg total) by mouth daily. 30 tablet 5  . DARUNAVIR ETHANOLATE 800 MG PO TABS Oral Take 1 tablet (800 mg total) by mouth daily with breakfast. 90 tablet 3  . EMTRICITABINE-TENOFOVIR 200-300 MG PO TABS Oral Take 1 tablet by mouth daily. 30 tablet 5  . GABAPENTIN 300 MG PO CAPS Oral Take 1 capsule (300 mg total) by mouth 3 (three) times daily. 180 capsule 4  . LISINOPRIL 40 MG PO TABS Oral Take 1 tablet (40 mg total) by mouth daily.  30 tablet 5  . MELOXICAM 15 MG PO TABS  TAKE ONE TABLET   BY MOUTH   DAILY 30 tablet 0  . METFORMIN HCL 500 MG PO TABS Oral Take 1 tablet (500 mg total) by mouth 2 (two) times daily with a meal. 120 tablet 4  . PRAVASTATIN SODIUM 20 MG PO TABS Oral Take 20 mg by mouth every evening.    Marland Kitchen RALTEGRAVIR POTASSIUM 400 MG PO TABS Oral Take 1 tablet (400 mg total) by mouth 2 (two) times daily. 60 tablet 5  . RITONAVIR 100 MG PO TABS Oral Take 1 tablet (100 mg total) by mouth daily with breakfast. 90 tablet 3  . TESTOSTERONE 50 MG/5GM TD GEL Transdermal Place 5 g onto the skin daily.     . OXYCODONE-ACETAMINOPHEN 5-325 MG PO TABS Oral Take 2 tablets by mouth every 4 (four) hours as needed for pain. 15 tablet 0    BP 105/70  Pulse 99  Temp 97.4 F (36.3 C)  Resp 18  SpO2 100%   Physical Exam  Nursing note  and vitals reviewed. Constitutional: He is oriented to person, place, and time. He appears well-developed and well-nourished. No distress.  HENT:  Head: Normocephalic and atraumatic.  Eyes: Conjunctivae are normal.  Cardiovascular: Regular rhythm and normal heart sounds.  Tachycardia present.   Pulmonary/Chest: Effort normal and breath sounds normal. No respiratory distress.  Musculoskeletal:       Tenderness of palpation of patella No medial line tenderness of left knee No lateral line tenderness of left knee No significant effusion No crepitus  Anterior and posterior drawer test unremarkable  Gross sensation intact Pt intact Strength 5/5   Neurological: He is alert and oriented to person, place, and time.  Skin: Skin is warm and dry.  Psychiatric: He has a normal mood and affect. His behavior is normal.    ED Course  Procedures (including critical care time) DIAGNOSTIC STUDIES: Oxygen Saturation is 99% on room air, normal by my interpretation.    COORDINATION OF CARE: 4:01PM EDP ordered medication: percocet 325 mg    Labs Reviewed  GLUCOSE, CAPILLARY - Abnormal; Notable for the following:    Glucose-Capillary 368 (*)     All other components within normal limits  POCT I-STAT, CHEM 8 - Abnormal; Notable for the following:    Sodium 134 (*)     Glucose, Bld 358 (*)     Calcium, Ion 1.28 (*)     All other components within normal limits   Dg Knee Complete 4 Views Right  10/16/2011  *RADIOLOGY REPORT*  Clinical Data: Status post fall.  Knee pain.  RIGHT KNEE - COMPLETE 4+ VIEW  Comparison: Plain films 02/02/2011.  Findings: Imaged bones, joints and soft tissues appear normal.  IMPRESSION: Negative exam.  Original Report Authenticated By: Arvid Right. D'ALESSIO, M.D.    1. Hyperglycemia   2. Fall   3. Knee pain     MDM  Knee pain after fall. XR without acute injury. Neuro intact. Hyperglycemia without ketosis (nonfasting glusoe). Bicarb 27. AG 19 c/w previous. The pt  appears well. He is not in DKA or HONK. States that he had a large bagel pta and that he is not compliant with diet. Given additional dose of metformin in ED. He was previously insulin dependent and states he has f/u with his PMD within the week to discuss his uncontrolled glu. No EMC precluding discharge at this time. Given Precautions for return. PMD f/u.   I personally  performed the services described in this documentation, which was scribed in my presence. The recorded information has been reviewed and considered.         [name], MD 10/16/11 [place], MD 10/16/11 1759

## 2011-10-17 ENCOUNTER — Encounter: Payer: Medicaid Other | Admitting: Dietician

## 2011-10-28 ENCOUNTER — Other Ambulatory Visit: Payer: Medicaid Other

## 2011-11-10 ENCOUNTER — Inpatient Hospital Stay (HOSPITAL_COMMUNITY)
Admission: EM | Admit: 2011-11-10 | Discharge: 2011-11-11 | DRG: 637 | Disposition: A | Discharge status: Home / Self Care | Payer: Medicaid Other | Attending: Internal Medicine | Admitting: Internal Medicine

## 2011-11-10 ENCOUNTER — Encounter (HOSPITAL_COMMUNITY): Payer: Self-pay | Admitting: *Deleted

## 2011-11-10 ENCOUNTER — Inpatient Hospital Stay (HOSPITAL_COMMUNITY): Payer: Medicaid Other

## 2011-11-10 DIAGNOSIS — A4902 Methicillin resistant Staphylococcus aureus infection, unspecified site: Secondary | ICD-10-CM | POA: Diagnosis present

## 2011-11-10 DIAGNOSIS — E119 Type 2 diabetes mellitus without complications: Secondary | ICD-10-CM | POA: Diagnosis present

## 2011-11-10 DIAGNOSIS — Z7982 Long term (current) use of aspirin: Secondary | ICD-10-CM

## 2011-11-10 DIAGNOSIS — G8929 Other chronic pain: Secondary | ICD-10-CM | POA: Diagnosis present

## 2011-11-10 DIAGNOSIS — F528 Other sexual dysfunction not due to a substance or known physiological condition: Secondary | ICD-10-CM | POA: Diagnosis present

## 2011-11-10 DIAGNOSIS — E1165 Type 2 diabetes mellitus with hyperglycemia: Secondary | ICD-10-CM

## 2011-11-10 DIAGNOSIS — R7309 Other abnormal glucose: Secondary | ICD-10-CM

## 2011-11-10 DIAGNOSIS — I10 Essential (primary) hypertension: Secondary | ICD-10-CM | POA: Diagnosis present

## 2011-11-10 DIAGNOSIS — E871 Hypo-osmolality and hyponatremia: Secondary | ICD-10-CM | POA: Diagnosis present

## 2011-11-10 DIAGNOSIS — E785 Hyperlipidemia, unspecified: Secondary | ICD-10-CM | POA: Diagnosis present

## 2011-11-10 DIAGNOSIS — IMO0002 Reserved for concepts with insufficient information to code with codable children: Secondary | ICD-10-CM

## 2011-11-10 DIAGNOSIS — Z79899 Other long term (current) drug therapy: Secondary | ICD-10-CM

## 2011-11-10 DIAGNOSIS — B20 Human immunodeficiency virus [HIV] disease: Secondary | ICD-10-CM | POA: Diagnosis present

## 2011-11-10 DIAGNOSIS — R739 Hyperglycemia, unspecified: Secondary | ICD-10-CM | POA: Diagnosis present

## 2011-11-10 DIAGNOSIS — M25569 Pain in unspecified knee: Secondary | ICD-10-CM | POA: Diagnosis present

## 2011-11-10 HISTORY — DX: Human immunodeficiency virus (HIV) disease: B20

## 2011-11-10 LAB — CBC WITH DIFFERENTIAL/PLATELET
Basophils Absolute: 0 10*3/uL (ref 0.0–0.1)
Basophils Relative: 0 % (ref 0–1)
Eosinophils Absolute: 0.1 10*3/uL (ref 0.0–0.7)
Eosinophils Relative: 1 % (ref 0–5)
HCT: 39 % (ref 39.0–52.0)
Hemoglobin: 13.6 g/dL (ref 13.0–17.0)
Lymphocytes Relative: 22 % (ref 12–46)
Lymphs Abs: 1.8 10*3/uL (ref 0.7–4.0)
MCH: 28.5 pg (ref 26.0–34.0)
MCHC: 34.9 g/dL (ref 30.0–36.0)
MCV: 81.6 fL (ref 78.0–100.0)
Monocytes Absolute: 0.4 10*3/uL (ref 0.1–1.0)
Monocytes Relative: 5 % (ref 3–12)
Neutro Abs: 6.1 10*3/uL (ref 1.7–7.7)
Neutrophils Relative %: 73 % (ref 43–77)
Platelets: 212 10*3/uL (ref 150–400)
RBC: 4.78 MIL/uL (ref 4.22–5.81)
RDW: 13.7 % (ref 11.5–15.5)
WBC: 8.4 10*3/uL (ref 4.0–10.5)

## 2011-11-10 LAB — BASIC METABOLIC PANEL
BUN: 11 mg/dL (ref 6–23)
BUN: 10 mg/dL (ref 6–23)
CO2: 29 mEq/L (ref 19–32)
CO2: 29 mEq/L (ref 19–32)
Calcium: 9.8 mg/dL (ref 8.4–10.5)
Calcium: 10.1 mg/dL (ref 8.4–10.5)
Chloride: 92 mEq/L — ABNORMAL LOW (ref 96–112)
Chloride: 93 mEq/L — ABNORMAL LOW (ref 96–112)
Creatinine, Ser: 0.66 mg/dL (ref 0.50–1.35)
Creatinine, Ser: 0.64 mg/dL (ref 0.50–1.35)
GFR calc Af Amer: >90 mL/min (ref >90)
GFR calc Af Amer: >90 mL/min (ref >90)
GFR calc non Af Amer: >90 mL/min (ref >90)
GFR calc non Af Amer: >90 mL/min (ref >90)
Glucose, Bld: 545 mg/dL — ABNORMAL HIGH (ref 70–99)
Glucose, Bld: 366 mg/dL — ABNORMAL HIGH (ref 70–99)
Potassium: 4 mEq/L (ref 3.5–5.1)
Potassium: 3.9 mEq/L (ref 3.5–5.1)
Sodium: 130 mEq/L — ABNORMAL LOW (ref 135–145)
Sodium: 131 mEq/L — ABNORMAL LOW (ref 135–145)

## 2011-11-10 LAB — URINALYSIS, ROUTINE W REFLEX MICROSCOPIC
Bilirubin Urine: NEGATIVE
Glucose, UA: >1000 mg/dL — AB
Hgb urine dipstick: NEGATIVE
Ketones, ur: NEGATIVE mg/dL
Leukocytes, UA: NEGATIVE
Nitrite: NEGATIVE
Protein, ur: NEGATIVE mg/dL
Specific Gravity, Urine: 1.033 — ABNORMAL HIGH (ref 1.005–1.030)
Urobilinogen, UA: 0.2 mg/dL (ref 0.0–1.0)
pH: 6 (ref 5.0–8.0)

## 2011-11-10 LAB — COMPREHENSIVE METABOLIC PANEL
ALT: 11 U/L (ref 0–53)
AST: 16 U/L (ref 0–37)
Albumin: 4.3 g/dL (ref 3.5–5.2)
Alkaline Phosphatase: 104 U/L (ref 39–117)
BUN: 12 mg/dL (ref 6–23)
CO2: 28 mEq/L (ref 19–32)
Calcium: 10.3 mg/dL (ref 8.4–10.5)
Chloride: 86 mEq/L — ABNORMAL LOW (ref 96–112)
Creatinine, Ser: 0.74 mg/dL (ref 0.50–1.35)
GFR calc Af Amer: >90 mL/min (ref >90)
GFR calc non Af Amer: >90 mL/min (ref >90)
Glucose, Bld: 762 mg/dL (ref 70–99)
Potassium: 4.9 mEq/L (ref 3.5–5.1)
Sodium: 126 mEq/L — ABNORMAL LOW (ref 135–145)
Total Bilirubin: 0.4 mg/dL (ref 0.3–1.2)
Total Protein: 9 g/dL — ABNORMAL HIGH (ref 6.0–8.3)

## 2011-11-10 LAB — GLUCOSE, CAPILLARY
Glucose-Capillary: >600 mg/dL (ref 70–99)
Glucose-Capillary: >600 mg/dL (ref 70–99)
Glucose-Capillary: 432 mg/dL — ABNORMAL HIGH (ref 70–99)
Glucose-Capillary: 355 mg/dL — ABNORMAL HIGH (ref 70–99)
Glucose-Capillary: 419 mg/dL — ABNORMAL HIGH (ref 70–99)
Glucose-Capillary: 397 mg/dL — ABNORMAL HIGH (ref 70–99)

## 2011-11-10 LAB — URINE MICROSCOPIC-ADD ON: Urine-Other: NONE SEEN

## 2011-11-10 MED ORDER — SODIUM CHLORIDE 0.9 % IV SOLN
INTRAVENOUS | Status: DC
  Administered 2011-11-11: 05:00:00 via INTRAVENOUS

## 2011-11-10 MED ORDER — ASPIRIN 81 MG PO CHEW
CHEWABLE_TABLET | ORAL | Status: AC
  Filled 2011-11-10: qty 1

## 2011-11-10 MED ORDER — SIMVASTATIN 10 MG PO TABS
10.0000 mg | ORAL_TABLET | Freq: Every day | ORAL | Status: DC
  Filled 2011-11-10 (×2): qty 1

## 2011-11-10 MED ORDER — INSULIN REGULAR BOLUS VIA INFUSION
0.0000 [IU] | Freq: Three times a day (TID) | INTRAVENOUS | Status: DC
  Filled 2011-11-10: qty 10

## 2011-11-10 MED ORDER — TESTOSTERONE 50 MG/5GM (1%) TD GEL
5.0000 g | Freq: Every day | TRANSDERMAL | Status: DC
  Administered 2011-11-11: 5 g via TRANSDERMAL
  Filled 2011-11-10: qty 5

## 2011-11-10 MED ORDER — GABAPENTIN 300 MG PO CAPS
300.0000 mg | ORAL_CAPSULE | Freq: Three times a day (TID) | ORAL | Status: DC
  Administered 2011-11-11: 300 mg via ORAL
  Filled 2011-11-10 (×4): qty 1

## 2011-11-10 MED ORDER — DARUNAVIR ETHANOLATE 800 MG PO TABS
800.0000 mg | ORAL_TABLET | Freq: Every day | ORAL | Status: DC
  Administered 2011-11-11: 800 mg via ORAL
  Filled 2011-11-10 (×2): qty 1

## 2011-11-10 MED ORDER — SODIUM CHLORIDE 0.9 % IV SOLN
INTRAVENOUS | Status: DC
  Administered 2011-11-10: 5.4 [IU]/h via INTRAVENOUS
  Filled 2011-11-10: qty 1

## 2011-11-10 MED ORDER — SODIUM CHLORIDE 0.9 % IV SOLN
INTRAVENOUS | Status: DC
  Administered 2011-11-10: 17:00:00 via INTRAVENOUS

## 2011-11-10 MED ORDER — INSULIN ASPART 100 UNIT/ML ~~LOC~~ SOLN
0.0000 [IU] | SUBCUTANEOUS | Status: DC
  Administered 2011-11-10: 9 [IU] via SUBCUTANEOUS
  Administered 2011-11-11: 5 [IU] via SUBCUTANEOUS
  Administered 2011-11-11: 2 [IU] via SUBCUTANEOUS
  Administered 2011-11-11 (×2): 7 [IU] via SUBCUTANEOUS

## 2011-11-10 MED ORDER — ENOXAPARIN SODIUM 40 MG/0.4ML ~~LOC~~ SOLN
40.0000 mg | SUBCUTANEOUS | Status: DC
  Administered 2011-11-10: 40 mg via SUBCUTANEOUS
  Filled 2011-11-10 (×2): qty 0.4

## 2011-11-10 MED ORDER — METFORMIN HCL 500 MG PO TABS
500.0000 mg | ORAL_TABLET | Freq: Two times a day (BID) | ORAL | Status: DC
  Administered 2011-11-11: 500 mg via ORAL
  Filled 2011-11-10 (×3): qty 1

## 2011-11-10 MED ORDER — IBUPROFEN 400 MG PO TABS
400.0000 mg | ORAL_TABLET | Freq: Four times a day (QID) | ORAL | Status: DC | PRN
  Filled 2011-11-10: qty 1

## 2011-11-10 MED ORDER — DEXTROSE 50 % IV SOLN: 25.0000 mL | INTRAVENOUS | Status: DC | PRN

## 2011-11-10 MED ORDER — ATENOLOL 50 MG PO TABS
50.0000 mg | ORAL_TABLET | Freq: Every day | ORAL | Status: DC
  Administered 2011-11-10 – 2011-11-11 (×2): 50 mg via ORAL
  Filled 2011-11-10 (×2): qty 1

## 2011-11-10 MED ORDER — INSULIN ASPART 100 UNIT/ML ~~LOC~~ SOLN: 0.0000 [IU] | Freq: Every day | SUBCUTANEOUS | Status: DC

## 2011-11-10 MED ORDER — ASPIRIN EC 81 MG PO TBEC
81.0000 mg | DELAYED_RELEASE_TABLET | Freq: Every day | ORAL | Status: DC
  Administered 2011-11-10 – 2011-11-11 (×2): 81 mg via ORAL
  Filled 2011-11-10 (×2): qty 1

## 2011-11-10 MED ORDER — INSULIN GLARGINE 100 UNIT/ML ~~LOC~~ SOLN
10.0000 [IU] | Freq: Every day | SUBCUTANEOUS | Status: DC
  Administered 2011-11-10: 10 [IU] via SUBCUTANEOUS

## 2011-11-10 MED ORDER — LISINOPRIL 40 MG PO TABS
40.0000 mg | ORAL_TABLET | Freq: Every day | ORAL | Status: DC
  Administered 2011-11-10 – 2011-11-11 (×2): 40 mg via ORAL
  Filled 2011-11-10 (×2): qty 1

## 2011-11-10 MED ORDER — RITONAVIR 100 MG PO TABS
100.0000 mg | ORAL_TABLET | Freq: Every day | ORAL | Status: DC
  Administered 2011-11-11: 100 mg via ORAL
  Filled 2011-11-10 (×2): qty 1

## 2011-11-10 MED ORDER — INSULIN ASPART 100 UNIT/ML ~~LOC~~ SOLN: 0.0000 [IU] | Freq: Three times a day (TID) | SUBCUTANEOUS | Status: DC

## 2011-11-10 MED ORDER — DEXTROSE-NACL 5-0.45 % IV SOLN: INTRAVENOUS | Status: DC

## 2011-11-10 MED ORDER — DAPSONE 100 MG PO TABS
100.0000 mg | ORAL_TABLET | Freq: Every day | ORAL | Status: DC
  Administered 2011-11-10 – 2011-11-11 (×2): 100 mg via ORAL
  Filled 2011-11-10 (×2): qty 1

## 2011-11-10 MED ORDER — EMTRICITABINE-TENOFOVIR DF 200-300 MG PO TABS
1.0000 | ORAL_TABLET | Freq: Every day | ORAL | Status: DC
  Administered 2011-11-11: 1 via ORAL
  Filled 2011-11-10 (×3): qty 1

## 2011-11-10 MED ORDER — ASPIRIN 81 MG PO TBEC: 81.0000 mg | DELAYED_RELEASE_TABLET | Freq: Every day | ORAL | Status: DC

## 2011-11-10 MED ORDER — RALTEGRAVIR POTASSIUM 400 MG PO TABS
400.0000 mg | ORAL_TABLET | Freq: Two times a day (BID) | ORAL | Status: DC
  Administered 2011-11-11: 400 mg via ORAL
  Filled 2011-11-10 (×3): qty 1

## 2011-11-10 NOTE — ED Notes (Signed)
Pt reports feeling like his cbg is high, having frequent urination. Also having left knee pain.

## 2011-11-10 NOTE — H&P (Signed)
Hospital Admission Note Date: 11/10/2011  Patient name: Keith [name] Medical record number: [name] Date of birth: 09-29-1965 Age: 46 y.o. Gender: male PCP: Clinton Gallant, MD  Medical Service: Internal Medicine  Attending physician: Dr. Milta Deiters   1st Contact: Dr. Karlyn Agee Pager: [phone] 2nd Contact: Dr. Ezequiel Kayser [phone] After 5 pm or weekends: 1st Contact:   Pager: [phone] 2nd Contact:   Pager: [phone]  Chief Complaint: elevated blood glucose level, chronic knee pain  History of Present Illness: 46 y.o man PMH DM 2 (?compliance), HIV/AIDS, HTN, chronic pain, erectile dysfunction presented to the ED with elevated blood glucose associated with polydipsia, polyuria; also presenting with chronic knee pain, bilaterally.   High blood sugar was associated with increased urination, increased thirst.  Patient reports he is taking DM medication but is unable to name the medication.  He also states he drank a 20 oz [employer] today and he likes to drink soda.    SH: Patient is currently not working x 4 years, previously worked for the [employer]   He is applying for disability.  Patient states he has 13 kids.  Pt denies EtOH and cigarette use.    Meds:  Medications Prior to Admission  Medication Sig Dispense Refill  . aspirin 81 MG EC tablet Take 1 tablet (81 mg total) by mouth daily.  30 tablet  5  . atenolol (TENORMIN) 50 MG tablet Take 1 tablet (50 mg total) by mouth daily.  30 tablet  5  . dapsone 100 MG tablet Take 1 tablet (100 mg total) by mouth daily.  30 tablet  5  . Darunavir Ethanolate (PREZISTA) 800 MG tablet Take 1 tablet (800 mg total) by mouth daily with breakfast.  90 tablet  3  . emtricitabine-tenofovir (TRUVADA) 200-300 MG per tablet Take 1 tablet by mouth daily.  30 tablet  5  . gabapentin (NEURONTIN) 300 MG capsule Take 1 capsule (300 mg total) by mouth 3 (three) times daily.  180 capsule  4  . lisinopril (PRINIVIL,ZESTRIL) 40 MG tablet  Take 1 tablet (40 mg total) by mouth daily.  30 tablet  5  . meloxicam (MOBIC) 15 MG tablet TAKE ONE TABLET   BY MOUTH   DAILY  30 tablet  0  . metFORMIN (GLUCOPHAGE) 500 MG tablet Take 1 tablet (500 mg total) by mouth 2 (two) times daily with a meal.  120 tablet  4  . pravastatin (PRAVACHOL) 20 MG tablet Take 20 mg by mouth every evening.      . raltegravir (ISENTRESS) 400 MG tablet Take 1 tablet (400 mg total) by mouth 2 (two) times daily.  60 tablet  5  . ritonavir (NORVIR) 100 MG TABS Take 1 tablet (100 mg total) by mouth daily with breakfast.  90 tablet  3  . testosterone (ANDROGEL) 50 MG/5GM GEL Place 5 g onto the skin daily.         Allergies: Allergies as of 11/10/2011 - Review Complete 11/10/2011  Allergen Reaction Noted  . Sulfa antibiotics Itching 03/26/2011   Past Medical History  Diagnosis Date  . HIV (human immunodeficiency virus infection) 2009    CD4 count 100, VL 13800 (05/01/2010)  . Diabetes type 2, uncontrolled     HgA1c 17.6 (04/27/2010)  . Hypertension   . Genital warts   . Erectile dysfunction   . Chronic knee pain     right  . Osteomyelitis     h/o hand  . Diabetes mellitus   .  Chronic pain   . AIDS    History reviewed. No pertinent past surgical history. Family History  Problem Relation Age of Onset  . Hypertension Mother   . Arthritis Father   . Hypertension Father   . Hypertension Brother   . Cancer Maternal Grandmother 53    unknown type of cancer  . Depression Paternal Grandmother    History   Social History  . Marital Status: Single    Spouse Name: N/A    Number of Children: 25  . Years of Education: 12   Occupational History  . Not on file.   Social History Main Topics  . Smoking status: Never Smoker   . Smokeless tobacco: Never Used  . Alcohol Use: No  . Drug Use: No  . Sexually Active: Yes -- Male partner(s)     given condoms   Other Topics Concern  . Not on file   Social History Narrative   Worked for the city of  [place] for 18 years.Unemployed. Applying for disability.Medicaid patient.    Review of Systems: General:+wt loss (noted by pt per EMR wt loss 25 lbs in 1 mo. Now ~240 lbs), subjective fever, decreased appetite, increased thirst HEENT: denies vision problems, denies h/a, denies sore throat Cardiac: denies cp, denies leg swelling  Pulm: +dry cough x 3 days, denies sob Abd: denies ab pain/n/v, normal bowel habits, h/o dry heaves this am (resolved) GU: denies dysuria, +increased urinary freq Ext: denies lower ext edema, +chronic b/l knee pain  Physical Exam: Blood pressure 139/99, pulse 80, temperature 98.3 F (36.8 C), temperature source Oral, resp. rate 15, height 5\' 9"  (1.753 m), weight 240 lb (108.863 kg), SpO2 99.00%. Vitals reviewed. General: resting in bed, NAD, poor historian HEENT: PERRL, no scleral icterus, poor dentition, no oral lesions  Cardiac: RRR, no rubs, murmurs or gallops Pulm: clear to auscultation bilaterally, no wheezes, rales, or rhonchi Abd: soft, nontender, nondistended, normal BS present Ext: warm and well perfused, no pedal edema, no diabetic foot ulcers noted  Neuro: alert and oriented X3  Lab results: Basic Metabolic Panel:  Basename 11/10/11 1446  NA 126*  K 4.9  CL 86*  CO2 28  GLUCOSE 762*  BUN 12  CREATININE 0.74  CALCIUM 10.3  MG --  PHOS --   Liver Function Tests:  Basename 11/10/11 1446  AST 16  ALT 11  ALKPHOS 104  BILITOT 0.4  PROT 9.0*  ALBUMIN 4.3   CBC:  Basename 11/10/11 1446  WBC 8.4  NEUTROABS 6.1  HGB 13.6  HCT 39.0  MCV 81.6  PLT 212   CBG:  Basename 11/10/11 1722 11/10/11 1230  GLUCAP >600* >600*   Hemoglobin A1C: No results found for this basename: HGBA1C in the last 72 hours  Urine Drug Screen: Drugs of Abuse     Component Value Date/Time   LABOPIA NEG 08/07/2011 [place] 12/16/2009 1312   COCAINSCRNUR NEG 08/07/2011 [place] DETECTED 11/03/2010 2122   LABBENZ NEG  08/07/2011 1217   LABBENZ NONE DETECTED 11/03/2010 2122   AMPHETMU NEG 08/07/2011 1217   AMPHETMU NONE DETECTED 11/03/2010 2122   THCU NONE DETECTED 11/03/2010 2122   LABBARB NONE DETECTED 11/03/2010 2122   Urinalysis:  Basename 11/10/11 1452  COLORURINE YELLOW  LABSPEC 1.033*  PHURINE 6.0  GLUCOSEU >1000*  HGBUR NEGATIVE  BILIRUBINUR NEGATIVE  KETONESUR NEGATIVE  PROTEINUR NEGATIVE  UROBILINOGEN 0.2  NITRITE NEGATIVE  LEUKOCYTESUR NEGATIVE   Misc. Labs: HIV cd 4  ct, viral load, HA1C pending  Imaging results:  No results found.  Other results: none  Assessment & Plan by Problem: 46 y.o male PMH DM 2 (?compliance), HIV/AIDS, HTN, chronic pain, erectile dysfunction presented to the ED with elevated blood glucose associated with polydipsia, polyuria; also presenting with chronic knee pain.    1. Hyperglycemia -associated with polydipsia, polyura History of DM 2 with HA1C 7.7% in 09/2011 -blood glucose 762 without AG -UA negative  -pt states taking Metformin 500 mg bid but there may be a question of compliance with meds and diet (i.e drinking sodas) -confirm pt [facility] from [facility] -pt will need meter at discharge  -pending HA1C, monitoring BMET bid -Rx insulin gtt protocol  -when bg improved transition to basal insulin with 40 units and add SSI and glucose protocol  2. HIV/AIDS -dx'ed since 1999 -CD4 ct 30 07/2011, viral load ~98K 07/2011 -As of 07/2011 pt has clinical dx AIDS -repeating CD4 and viral load, pending CXR -pt follows outpt with Dr. [name] -Rx home HIV meds Dapsone 100 mg qd, Darunavir 800 mg qd, Emtricitabine-Tenofovir 200-300 mg qd, Raltegravir 400 mg bid    3. Chronic knee pain, bilateral -right knee imaged 10/2011 w/o any abnormalities; left knee previously imaged 10/2009 with mild degenerative changes  -prn Ibuprofen  -avoid narcotics in this patient    4. HTN -will monitor BP, 139/99 -Cont home meds Atenolol 50 mg qd, Lisinopril 40 mg  qd  5. HLD Lipid Panel 07/2011    Component Value Date/Time   CHOL 211* 07/24/2011 0911   TRIG 123 07/24/2011 0911   HDL 33* 07/24/2011 0911   CHOLHDL 6.4 07/24/2011 0911   VLDL 25 07/24/2011 0911   LDLCALC 153* 07/24/2011 0911    -pt Rx Pravastatin 20 mg qhs -LDL not at goal, will Rx Zocor 10 mg qhs  6. Erectile Dysfunction -restarted home med testosterone gel   7. F/E/N -hyponatremia 2/2 to hyperglycemia monitoring BMET -diet ordered   8. DVT Px  -Lovenox   9. Dispo -admit for fsbs monitoring and tx   Signed: Cresenciano Genre [id number] 11/10/2011, 6:32 PM

## 2011-11-10 NOTE — ED Provider Notes (Signed)
History     CSN: [id number]  Arrival date & time 11/10/11  1216   First MD Initiated Contact with Patient 11/10/11 1624      Chief Complaint  Patient presents with  . Hyperglycemia  . Knee Pain    (Consider location/radiation/quality/duration/timing/severity/associated sxs/prior treatment) Patient is a 46 y.o. male presenting with knee pain. The history is provided by the patient.  Knee Pain   patient here complaining of chronic left knee pain that is atraumatic. He also notes polyuria and polydipsia. CBG was taken a trash and noted to be very high in the Center for evaluation. Denies any fever, vomiting, diarrhea. Pain is left knee is worse with ambulation. Has been using Percocet for the knee pain with some relief. Notes that he has not been compliant with a diabetic diet however he hasn't taken his diabetic medications  Past Medical History  Diagnosis Date  . HIV (human immunodeficiency virus infection)     CD4 count 100, VL 13800 (05/01/2010)  . Diabetes type 2, uncontrolled     HgA1c 17.6 (04/27/2010)  . Hypertension   . Genital warts   . Erectile dysfunction   . Chronic knee pain     right  . Osteomyelitis     h/o hand  . Diabetes mellitus   . Chronic pain     History reviewed. No pertinent past surgical history.  Family History  Problem Relation Age of Onset  . Hypertension Mother   . Arthritis Father   . Hypertension Father   . Hypertension Brother   . Cancer Maternal Grandmother 21    unknown type of cancer  . Depression Paternal Grandmother     History  Substance Use Topics  . Smoking status: Never Smoker   . Smokeless tobacco: Never Used  . Alcohol Use: No      Review of Systems  All other systems reviewed and are negative.    Allergies  Sulfa antibiotics  Home Medications   Current Outpatient Rx  Name Route Sig Dispense Refill  . ASPIRIN 81 MG PO TBEC Oral Take 1 tablet (81 mg total) by mouth daily. 30 tablet 5  . ATENOLOL 50 MG PO  TABS Oral Take 1 tablet (50 mg total) by mouth daily. 30 tablet 5  . DAPSONE 100 MG PO TABS Oral Take 1 tablet (100 mg total) by mouth daily. 30 tablet 5  . DARUNAVIR ETHANOLATE 800 MG PO TABS Oral Take 1 tablet (800 mg total) by mouth daily with breakfast. 90 tablet 3  . EMTRICITABINE-TENOFOVIR 200-300 MG PO TABS Oral Take 1 tablet by mouth daily. 30 tablet 5  . GABAPENTIN 300 MG PO CAPS Oral Take 1 capsule (300 mg total) by mouth 3 (three) times daily. 180 capsule 4  . LISINOPRIL 40 MG PO TABS Oral Take 1 tablet (40 mg total) by mouth daily. 30 tablet 5  . MELOXICAM 15 MG PO TABS  TAKE ONE TABLET   BY MOUTH   DAILY 30 tablet 0  . METFORMIN HCL 500 MG PO TABS Oral Take 1 tablet (500 mg total) by mouth 2 (two) times daily with a meal. 120 tablet 4  . PRAVASTATIN SODIUM 20 MG PO TABS Oral Take 20 mg by mouth every evening.    Marland Kitchen RALTEGRAVIR POTASSIUM 400 MG PO TABS Oral Take 1 tablet (400 mg total) by mouth 2 (two) times daily. 60 tablet 5  . RITONAVIR 100 MG PO TABS Oral Take 1 tablet (100 mg total) by mouth daily with  breakfast. 90 tablet 3  . TESTOSTERONE 50 MG/5GM TD GEL Transdermal Place 5 g onto the skin daily.       BP 109/69  Pulse 99  Temp 98.3 F (36.8 C) (Oral)  Resp 18  SpO2 97%  Physical Exam  Nursing note and vitals reviewed. Constitutional: He is oriented to person, place, and time. He appears well-developed and well-nourished.  Non-toxic appearance. No distress.  HENT:  Head: Normocephalic and atraumatic.  Eyes: Conjunctivae, EOM and lids are normal. Pupils are equal, round, and reactive to light.  Neck: Normal range of motion. Neck supple. No tracheal deviation present. No mass present.  Cardiovascular: Normal rate, regular rhythm and normal heart sounds.  Exam reveals no gallop.   No murmur heard. Pulmonary/Chest: Effort normal and breath sounds normal. No stridor. No respiratory distress. He has no decreased breath sounds. He has no wheezes. He has no rhonchi. He has no  rales.  Abdominal: Soft. Normal appearance and bowel sounds are normal. He exhibits no distension. There is no tenderness. There is no rebound and no CVA tenderness.  Musculoskeletal: Normal range of motion. He exhibits no edema and no tenderness.  Neurological: He is alert and oriented to person, place, and time. He has normal strength. No cranial nerve deficit or sensory deficit. GCS eye subscore is 4. GCS verbal subscore is 5. GCS motor subscore is 6.  Skin: Skin is warm and dry. No abrasion and no rash noted.  Psychiatric: He has a normal mood and affect. His speech is normal and behavior is normal.    ED Course  Procedures (including critical care time)  Labs Reviewed  GLUCOSE, CAPILLARY - Abnormal; Notable for the following:    Glucose-Capillary >600 (*)     All other components within normal limits  COMPREHENSIVE METABOLIC PANEL - Abnormal; Notable for the following:    Sodium 126 (*)     Chloride 86 (*)     Glucose, Bld 762 (*)     Total Protein 9.0 (*)     All other components within normal limits  URINALYSIS, ROUTINE W REFLEX MICROSCOPIC - Abnormal; Notable for the following:    Specific Gravity, Urine 1.033 (*)     Glucose, UA >1000 (*)     All other components within normal limits  CBC WITH DIFFERENTIAL  URINE MICROSCOPIC-ADD ON   No results found.   No diagnosis found.    MDM  Patient given IV fluids and place on glucometer for his hyperglycemia. He will be admitted to the teaching service        Leota Jacobsen, MD 11/10/11 1640

## 2011-11-10 NOTE — ED Notes (Signed)
Called with Critical glucose level of 762 per Wynona Neat in Lab.  Verified result, and triage made aware.

## 2011-11-10 NOTE — ED Notes (Signed)
Verified admission orders.  Pt transported to 5N30 by Laqueta Linden, NT

## 2011-11-11 DIAGNOSIS — E119 Type 2 diabetes mellitus without complications: Principal | ICD-10-CM

## 2011-11-11 DIAGNOSIS — N529 Male erectile dysfunction, unspecified: Secondary | ICD-10-CM

## 2011-11-11 LAB — GLUCOSE, CAPILLARY
Glucose-Capillary: 258 mg/dL — ABNORMAL HIGH (ref 70–99)
Glucose-Capillary: 302 mg/dL — ABNORMAL HIGH (ref 70–99)
Glucose-Capillary: 158 mg/dL — ABNORMAL HIGH (ref 70–99)

## 2011-11-11 LAB — BASIC METABOLIC PANEL
BUN: 12 mg/dL (ref 6–23)
CO2: 29 mEq/L (ref 19–32)
Calcium: 9.5 mg/dL (ref 8.4–10.5)
Chloride: 96 mEq/L (ref 96–112)
Creatinine, Ser: 0.62 mg/dL (ref 0.50–1.35)
GFR calc Af Amer: >90 mL/min (ref >90)
GFR calc non Af Amer: >90 mL/min (ref >90)
Glucose, Bld: 303 mg/dL — ABNORMAL HIGH (ref 70–99)
Potassium: 4 mEq/L (ref 3.5–5.1)
Sodium: 132 mEq/L — ABNORMAL LOW (ref 135–145)

## 2011-11-11 LAB — HEMOGLOBIN A1C
Hgb A1c MFr Bld: 15 % — ABNORMAL HIGH (ref <5.7)
Mean Plasma Glucose: 384 mg/dL — ABNORMAL HIGH (ref <117)

## 2011-11-11 LAB — MRSA PCR SCREENING: MRSA by PCR: POSITIVE — AB

## 2011-11-11 MED ORDER — CHLORHEXIDINE GLUCONATE CLOTH 2 % EX PADS: 6.0000 | MEDICATED_PAD | Freq: Every day | CUTANEOUS | Status: DC

## 2011-11-11 MED ORDER — MUPIROCIN 2 % EX OINT: 1.0000 "application " | TOPICAL_OINTMENT | Freq: Two times a day (BID) | CUTANEOUS | Status: AC

## 2011-11-11 MED ORDER — TRAMADOL HCL 50 MG PO TABS: 50.0000 mg | ORAL_TABLET | Freq: Four times a day (QID) | ORAL | Status: AC | PRN

## 2011-11-11 MED ORDER — GLIPIZIDE ER 5 MG PO TB24: 5.0000 mg | ORAL_TABLET | Freq: Every day | ORAL | Status: DC

## 2011-11-11 MED ORDER — PRAVASTATIN SODIUM 20 MG PO TABS
20.0000 mg | ORAL_TABLET | Freq: Every day | ORAL | Status: DC
  Filled 2011-11-11: qty 1

## 2011-11-11 MED ORDER — TRAMADOL HCL 50 MG PO TABS: 50.0000 mg | ORAL_TABLET | Freq: Four times a day (QID) | ORAL | Status: DC | PRN

## 2011-11-11 MED ORDER — MUPIROCIN 2 % EX OINT
1.0000 "application " | TOPICAL_OINTMENT | Freq: Two times a day (BID) | CUTANEOUS | Status: DC
  Administered 2011-11-11: 1 via NASAL
  Filled 2011-11-11: qty 22

## 2011-11-11 NOTE — Progress Notes (Signed)
Patient d/c home today. Did not require Home health or DME. Patient discussed with RNCM; he was d/c home prior to this social worker being able to see him.  Lorie Phenix. [place], [place]  .

## 2011-11-11 NOTE — Discharge Summary (Signed)
. Internal Medicine [facility] Discharge Note  Name: Keith [name] MRN: [id number] DOB: [birth year] 46 y.o.  Date of Admission: 11/10/2011  4:16 PM Date of Discharge: 11/11/2011 Attending Physician: Keith Deiters, MD  Discharge Diagnosis: Principal Problem:  *Hyperglycemia Active Problems:  HIV DISEASE  ERECTILE DYSFUNCTION  HYPERTENSION  Chronic pain  Diabetes mellitus  AIDS  HLD (hyperlipidemia)  Hyponatremia   Discharge Medications: Medication List  As of 11/11/2011  3:07 PM   TAKE these medications         ANDROGEL 50 MG/5GM Gel   Generic drug: testosterone   Place 5 g onto the skin daily.      aspirin 81 MG EC tablet   Take 1 tablet (81 mg total) by mouth daily.      atenolol 50 MG tablet   Commonly known as: TENORMIN   Take 1 tablet (50 mg total) by mouth daily.      Chlorhexidine Gluconate Cloth 2 % Pads   Apply 6 each topically daily at 6 (six) AM.      dapsone 100 MG tablet   Take 1 tablet (100 mg total) by mouth daily.      Darunavir Ethanolate 800 MG tablet   Commonly known as: PREZISTA   Take 1 tablet (800 mg total) by mouth daily with breakfast.      emtricitabine-tenofovir 200-300 MG per tablet   Commonly known as: TRUVADA   Take 1 tablet by mouth daily.      gabapentin 300 MG capsule   Commonly known as: NEURONTIN   Take 1 capsule (300 mg total) by mouth 3 (three) times daily.      glipiZIDE 5 MG 24 hr tablet   Commonly known as: GLUCOTROL XL   Take 1 tablet (5 mg total) by mouth daily.      lisinopril 40 MG tablet   Commonly known as: PRINIVIL,ZESTRIL   Take 1 tablet (40 mg total) by mouth daily.      meloxicam 15 MG tablet   Commonly known as: MOBIC   TAKE ONE TABLET   BY MOUTH   DAILY      metFORMIN 500 MG tablet   Commonly known as: GLUCOPHAGE   Take 1 tablet (500 mg total) by mouth 2 (two) times daily with a meal.      mupirocin ointment 2 %   Commonly known as: BACTROBAN   Apply 1 application topically 2 (two)  times daily. Use for 3-5 days      pravastatin 20 MG tablet   Commonly known as: PRAVACHOL   Take 20 mg by mouth every evening.      raltegravir 400 MG tablet   Commonly known as: ISENTRESS   Take 1 tablet (400 mg total) by mouth 2 (two) times daily.      ritonavir 100 MG Tabs   Commonly known as: NORVIR   Take 1 tablet (100 mg total) by mouth daily with breakfast.      traMADol 50 MG tablet   Commonly known as: ULTRAM   Take 1 tablet (50 mg total) by mouth every 6 (six) hours as needed for pain.            Disposition and follow-up:   Mr.Keith [name] good condition.  At the hospital follow up visit please address 1) Check BMP 2) Meet with social work and [name] for resources  3) Address medication and diet compliance 4) F/U CD 4 ct and viral load, possibly genotype if lower  5) f/u fsbs since adding glipizide but will need to discuss insulin sq   Follow-up Appointments:  Discharge Orders    Future Appointments: Provider: Department: Dept Phone: Center:   11/12/2011 9:30 AM Keith Headings, MD [facility] [phone] [facility]   11/19/2011 2:30 PM Keith Gallant, MD [facility] [phone] [facility]     Future Orders Please Complete By Expires   Diet - low sodium heart healthy      Increase activity slowly      Discharge instructions      Comments:   Please go to your follow up appointments with Infectious Disease 11/12/11 (tomorrow, Tuesday) at 9:30 AM Please go to your follow up appointments with Internal Medicine 11/19/11 (Tuesday after your birthday) at 2:30 PM  Please take your medications as instructed      Consultations: none Procedures Performed:  Dg Chest 2 View  11/10/2011  *RADIOLOGY REPORT*  Clinical Data: HIV, cough  CHEST - 2 VIEW  Comparison:  04/06/2011  Findings:  The heart size and mediastinal contours are within normal limits.  Both lungs are clear.  The visualized skeletal structures are unremarkable.  IMPRESSION: No active cardiopulmonary disease.   Original Report Authenticated By: Jerilynn Mages. Daryll Brod, M.D.   Dg Knee Complete 4 Views Right  10/16/2011  *RADIOLOGY REPORT*  Clinical Data: Status post fall.  Knee pain.  RIGHT KNEE - COMPLETE 4+ VIEW  Comparison: Plain films 02/02/2011.  Findings: Imaged bones, joints and soft tissues appear normal.  IMPRESSION: Negative exam.  Original Report Authenticated By: Arvid Right. Luther Parody, M.D.    Admission HPI:  46 y.o man PMH DM 2 (?compliance), HIV/AIDS, HTN, chronic pain, erectile dysfunction presented to the ED with elevated blood glucose associated with polydipsia, polyuria; also presenting with chronic knee pain, bilaterally. High blood sugar was associated with increased urination, increased thirst. Patient reports he is taking DM medication but is unable to name the medication. He also states he drank a 20 oz [employer] today and he likes to drink soda.   SH: Patient is currently not working x 4 years, previously worked for the [employer] He is applying for disability. Patient states he has 13 kids. Pt denies EtOH and cigarette use.    Hospital Course by problem list: 46 y.o man PMH DM 2, HIV/AIDS, HTN, chronic b/l knee pain, erectile dysfunction presented to the ED 8/3 with complaints of bilateral knee pain noted also to have hyperglycemia as high as 762 with an AG of 12 (within normal limits).  He did not exhibit clinical signs of DKA this admission.  His fsbs trended down to 158 after treatment with insulin gtt.  After the insulin gtt was stopped he was transitioned to lantus 10 units and SSI.  Prior to admission his HA1C was 7.7 in 09/2011.  This admission HA1 C was 15.0%.  Medication and diet compliance may be an issue.  Patient is reluctant to get started on insulin again so will try another oral first but HA1C more elevated and will probably need sq insulin in the near future.  Will discharge patient with continuing his Metformin 500 mg bid and adding Glipizide XL 5 mg qd.  Patient to has  clinic appointment 11/19/10 at 2:30 PM with IM.  He needs to have a meeting with [name] at follow and have a BMP checked.  He has a history of HIV/AIDS dating back to at least 1999.  His last CD4 ct was 30 with viral  load ~98K in 07/2011.  He has a pending viral load and cd4 ct ordered this admission.  It is uncertain about his compliance with HIV medications given cd4 ct trending down.  If his cd4 count is lower consider genotyping in the future to decide medication treatment of choice.  Consider adding prophylactic treatments for lower cd4 ct.  Patient has appt with ID 11/12/11 at 9:30 am.  He has chronic pain in his bilateral knees with mild degenerative changes noted on imaging.  Narcotics should be avoided in this patient due history abuse (i.e getting narcotics from multiple sources).  We will try Ultram to see if this helps and discussed referral to sports medicine in the future.  He has hypertension which was controlled this admission.  He will continue his home medications Atenolol 50 mg qd, Lisinopril 40 mg qd.  He has erectile dysfunction and uses testosterone gel.  He has an abnormal lipid panel 07/2011 with elevated TC (211) and elevated LDL (153).  He has at home Pravastatin 20 mg qhs to continue.  This needs to be addressed at follow up. Goal LDL <100.  He was MRSA + nares this admission and sent home with bactroban bid in nares x 3-5 days and chlorohexidine.  He had hyponatremia noted this admission probably secondary to elevated glucose.  We discussed possible P4CC services with social worker but he is not eligible because he has medicaid. We set up with [place] worker in clinic to assist with his needs.    Discharge Vitals:  BP 113/61  Pulse 74  Temp 98 F (36.7 C) (Oral)  Resp 16  Ht 5\' 9"  (1.753 m)  Wt 233 lb 3.2 oz (105.779 kg)  BMI 34.44 kg/m2  SpO2 100%  Physical Exam on day of discharge  General: awake in bed, NAD  HEENT: [place]/AT no scleral icterus, poor dentition  Cardiac:  RRR, no rubs, murmurs or gallops  Pulm: clear to auscultation bilaterally, no wheezes, rales, or rhonchi  Abd: soft, nontender, nondistended, BS present (normal), obese abdomen  Ext: warm and well perfused, no pedal edema  Neuro: alert and oriented X3, CN 2-12 grossly intact, 5/5 motor strength upper and lower ext b/l, no gait abnormalities   Discharge Labs:  Results for orders placed during the hospital encounter of 11/10/11 (from the past 24 hour(s))  GLUCOSE, CAPILLARY     Status: Abnormal   Collection Time   11/10/11  5:22 PM      Component Value Range   Glucose-Capillary >600 (*) 70 - 99 mg/dL  BASIC METABOLIC PANEL     Status: Abnormal   Collection Time   11/10/11  6:11 PM      Component Value Range   Sodium 130 (*) 135 - 145 mEq/L   Potassium 4.0  3.5 - 5.1 mEq/L   Chloride 92 (*) 96 - 112 mEq/L   CO2 29  19 - 32 mEq/L   Glucose, Bld 545 (*) 70 - 99 mg/dL   BUN 11  6 - 23 mg/dL   Creatinine, Ser 0.66  0.50 - 1.35 mg/dL   Calcium 9.8  8.4 - 10.5 mg/dL   GFR calc non Af Amer >90  >90 mL/min   GFR calc Af Amer >90  >90 mL/min  HEMOGLOBIN A1C     Status: Abnormal   Collection Time   11/10/11  6:11 PM      Component Value Range   Hemoglobin A1C 15.0 (*) <5.7 %   Mean Plasma  Glucose 384 (*) <117 mg/dL  GLUCOSE, CAPILLARY     Status: Abnormal   Collection Time   11/10/11  7:38 PM      Component Value Range   Glucose-Capillary 432 (*) 70 - 99 mg/dL  GLUCOSE, CAPILLARY     Status: Abnormal   Collection Time   11/10/11  8:40 PM      Component Value Range   Glucose-Capillary 355 (*) 70 - 99 mg/dL  BASIC METABOLIC PANEL     Status: Abnormal   Collection Time   11/10/11  8:48 PM      Component Value Range   Sodium 131 (*) 135 - 145 mEq/L   Potassium 3.9  3.5 - 5.1 mEq/L   Chloride 93 (*) 96 - 112 mEq/L   CO2 29  19 - 32 mEq/L   Glucose, Bld 366 (*) 70 - 99 mg/dL   BUN 10  6 - 23 mg/dL   Creatinine, Ser 0.64  0.50 - 1.35 mg/dL   Calcium 10.1  8.4 - 10.5 mg/dL   GFR calc non Af  Amer >90  >90 mL/min   GFR calc Af Amer >90  >90 mL/min  GLUCOSE, CAPILLARY     Status: Abnormal   Collection Time   11/10/11  9:47 PM      Component Value Range   Glucose-Capillary 419 (*) 70 - 99 mg/dL  GLUCOSE, CAPILLARY     Status: Abnormal   Collection Time   11/10/11 10:56 PM      Component Value Range   Glucose-Capillary 397 (*) 70 - 99 mg/dL  GLUCOSE, CAPILLARY     Status: Abnormal   Collection Time   11/11/11  2:30 AM      Component Value Range   Glucose-Capillary 258 (*) 70 - 99 mg/dL  BASIC METABOLIC PANEL     Status: Abnormal   Collection Time   11/11/11  4:45 AM      Component Value Range   Sodium 132 (*) 135 - 145 mEq/L   Potassium 4.0  3.5 - 5.1 mEq/L   Chloride 96  96 - 112 mEq/L   CO2 29  19 - 32 mEq/L   Glucose, Bld 303 (*) 70 - 99 mg/dL   BUN 12  6 - 23 mg/dL   Creatinine, Ser 0.62  0.50 - 1.35 mg/dL   Calcium 9.5  8.4 - 10.5 mg/dL   GFR calc non Af Amer >90  >90 mL/min   GFR calc Af Amer >90  >90 mL/min  GLUCOSE, CAPILLARY     Status: Abnormal   Collection Time   11/11/11  6:56 AM      Component Value Range   Glucose-Capillary 302 (*) 70 - 99 mg/dL  MRSA PCR SCREENING     Status: Abnormal   Collection Time   11/11/11  8:25 AM      Component Value Range   MRSA by PCR POSITIVE (*) NEGATIVE  GLUCOSE, CAPILLARY     Status: Abnormal   Collection Time   11/11/11 11:23 AM      Component Value Range   Glucose-Capillary 158 (*) 70 - 99 mg/dL   Comment 1 Notify RN     Comment 2 Documented in Chart      Signed: Cresenciano Genre [id number] 11/11/2011, 3:07 PM   Time Spent on Discharge: 1 day

## 2011-11-11 NOTE — Clinical Social Work Psychosocial (Signed)
     Clinical Social Work Department BRIEF PSYCHOSOCIAL ASSESSMENT 11/11/2011  Patient:  Keith [name], Keith [name]     Account Number:  [id number]     [place] date:  11/10/2011  Clinical Social Worker:  Katrinka Blazing  Date/Time:  11/11/2011 02:39 PM  Referred by:  Physician  Date Referred:   Referred for  Other - See comment   Other Referral:   Potential P4CC referral.   Interview type:  Patient Other interview type:    PSYCHOSOCIAL DATA Living Status:  ALONE Admitted from facility:   Level of care:   Primary support name:  [name]: [phone] Primary support relationship to patient:  PARENT Degree of support available:   Unknown.    CURRENT CONCERNS Current Concerns  Other - See comment   Other Concerns:   Potential P4CC referral.    SOCIAL WORK ASSESSMENT / PLAN Clinical Social Worker recieved referral indicating pt may benefit from assistance in the community setting to assist with management of chronic diseases.  CSW reviewed chart and met with pt.  CSW introduced self, explained role, and provided support.  Pt was dressed in his clothes and sitting by window.  Pt stated, "I'm getting ready to leave".  CSW requested permission to speak with pt re: community program; pt consented.  CSW reviewed [facility] program; pt expressed interest in program.  Pt denied any questions or concerns.  CSW to update service line CSW, [name].  CSW to continue to follow and assist as needed.   Assessment/plan status:  Information/Referral to [employer] Other assessment/ plan:   Information/referral to community resources:   P4CC.    PATIENTS/FAMILYS RESPONSE TO PLAN OF CARE: Pt was pleasant and minimally engaged.  Pt expressed interest in program but appeared ready to "go home".

## 2011-11-11 NOTE — H&P (Signed)
IM Attending  51 man with DM and chronic knee pain.  He has a pattern of multiple ER visits seeking opiates.  Our clinic has specified that he is not to receive opiates from our clinic.  also has HIV and a pattern of poor adherence.  He insists he always takes meds.  CD4 = 30 and viral load = 98,000.   This admission for BG of 760.  He is well-hydrated and not in DKA.  Has only been on metformin but has lost a lot of weight, mostly in 2011-2012.  Is reluctant to consider going back on insulin.  all he wants to talk about is percocet. Knee unimpressive and imaging was negative.  If he wants to pursue knee pain, suggest eventual sports medicine referral.  Would not go beyond tramadol for pain. He will almost certainly need insulin but you could try glipizide and metformin with close follow-up.

## 2011-11-11 NOTE — Progress Notes (Signed)
Inpatient Diabetes Program Recommendations  AACE/ADA: New Consensus Statement on Inpatient Glycemic Control (2013)  Target Ranges:  Prepandial:   less than 140 mg/dL      Peak postprandial:   less than 180 mg/dL (1-2 hours)      Critically ill patients:  140 - 180 mg/dL   Reason for Visit: Hyperglycemia  Inpatient Diabetes Program Recommendations Insulin - IV drip/GlucoStabilizer: Please consider restarting IV insulin per GlucoStabilizer as glucose levels are still in 300's and do not appear to be responding to subcutaneous insulin at this time. Correction (SSI): If not going to restart the IV insulin, please incresase his correction to moderate or resistant q 4 hrs  Insulin - Meal Coverage: Pt can eat on GlucoStabilizer using the bolus carb coverage program.  Note: Thank you, Rosita Kea, RN, CNS, Diabetes Coordinator [phone])

## 2011-11-11 NOTE — Progress Notes (Addendum)
Subjective: Patient denies complaints except for chronic knee pain.   He is agreeable to try Ultram for pain.    Objective: Vital signs in last 24 hours: Filed Vitals:   11/10/11 1715 11/10/11 2023 11/11/11 0504 11/11/11 0700  BP:  127/76 113/61   Pulse:  89 74   Temp:  98.4 F (36.9 C) 98 F (36.7 C)   TempSrc:  Oral Oral   Resp:  16 16   Height: 5\' 9"  (1.753 m)     Weight: 240 lb (108.863 kg)   233 lb 3.2 oz (105.779 kg)  SpO2:  100% 100%    Weight change:   Intake/Output Summary (Last 24 hours) at 11/11/11 1421 Last data filed at 11/11/11 0503  Gross per 24 hour  Intake      0 ml  Output    700 ml  Net   -700 ml   Vitals reviewed. General: awake in bed, NAD HEENT: Reed/AT no scleral icterus, poor dentition Cardiac: RRR, no rubs, murmurs or gallops Pulm: clear to auscultation bilaterally, no wheezes, rales, or rhonchi Abd: soft, nontender, nondistended, BS present (normal), obese abdomen Ext: warm and well perfused, no pedal edema Neuro: alert and oriented X3, CN 2-12 grossly intact, 5/5 motor strength upper and lower ext b/l, no gait abnormalities  Lab Results: Basic Metabolic Panel:  Lab [id number] 0445 11/10/11 2048  NA 132* 131*  K 4.0 3.9  CL 96 93*  CO2 29 29  GLUCOSE 303* 366*  BUN 12 10  CREATININE 0.62 0.64  CALCIUM 9.5 10.1  MG -- --  PHOS -- --   Liver Function Tests:  Lab 11/10/11 1446  AST 16  ALT 11  ALKPHOS 104  BILITOT 0.4  PROT 9.0*  ALBUMIN 4.3   CBC:  Lab 11/10/11 1446  WBC 8.4  NEUTROABS 6.1  HGB 13.6  HCT 39.0  MCV 81.6  PLT 212   CBG:  Lab 11/11/11 1123 11/11/11 0656 11/11/11 0230 11/10/11 2256 11/10/11 2147 11/10/11 2040  GLUCAP 158* 302* 258* 397* 419* 355*   Hemoglobin A1C:  Lab 11/10/11 1811  HGBA1C 15.0*   Urine Drug Screen: Drugs of Abuse     Component Value Date/Time   LABOPIA NEG 08/07/2011 1217   LABOPIA NEGATIVE 12/16/2009 1312   COCAINSCRNUR NEG 08/07/2011 1217   COCAINSCRNUR NONE DETECTED 11/03/2010 2122    LABBENZ NEG 08/07/2011 1217   LABBENZ NONE DETECTED 11/03/2010 2122   AMPHETMU NEG 08/07/2011 1217   AMPHETMU NONE DETECTED 11/03/2010 2122   THCU NONE DETECTED 11/03/2010 2122   LABBARB NONE DETECTED 11/03/2010 2122    Urinalysis:  Lab 11/10/11 1452  COLORURINE YELLOW  LABSPEC 1.033*  PHURINE 6.0  GLUCOSEU >1000*  HGBUR NEGATIVE  BILIRUBINUR NEGATIVE  KETONESUR NEGATIVE  PROTEINUR NEGATIVE  UROBILINOGEN 0.2  NITRITE NEGATIVE  LEUKOCYTESUR NEGATIVE   Misc. Labs: pending CD 4 count and HIV viral load   Micro Results: Recent Results (from the past 240 hour(s))  MRSA PCR SCREENING     Status: Abnormal   Collection Time   11/11/11  8:25 AM      Component Value Range Status Comment   MRSA by PCR POSITIVE (*) NEGATIVE Final    Studies/Results: Dg Chest 2 View  11/10/2011  *RADIOLOGY REPORT*  Clinical Data: HIV, cough  CHEST - 2 VIEW  Comparison:  04/06/2011  Findings:  The heart size and mediastinal contours are within normal limits.  Both lungs are clear.  The visualized skeletal structures are unremarkable.  IMPRESSION: No active cardiopulmonary disease.  Original Report Authenticated By: Jerilynn Mages. Daryll Brod, M.D.   Medications:  Scheduled Meds:   . aspirin      . aspirin EC  81 mg Oral Daily  . atenolol  50 mg Oral Daily  . Chlorhexidine Gluconate Cloth  6 each Topical Q0600  . dapsone  100 mg Oral Daily  . Darunavir Ethanolate  800 mg Oral Q breakfast  . emtricitabine-tenofovir  1 tablet Oral Daily  . enoxaparin (LOVENOX) injection  40 mg Subcutaneous Q24H  . gabapentin  300 mg Oral TID  . insulin aspart  0-9 Units Subcutaneous Q4H  . insulin glargine  10 Units Subcutaneous QHS  . lisinopril  40 mg Oral Daily  . metFORMIN  500 mg Oral BID WC  . mupirocin ointment  1 application Nasal BID  . pravastatin  20 mg Oral QHS  . raltegravir  400 mg Oral BID  . ritonavir  100 mg Oral Q breakfast  . testosterone  5 g Transdermal Daily  . DISCONTD: aspirin  81 mg Oral Daily  .  DISCONTD: insulin aspart  0-5 Units Subcutaneous QHS  . DISCONTD: insulin aspart  0-9 Units Subcutaneous TID WC  . DISCONTD: insulin regular  0-10 Units Intravenous TID WC  . DISCONTD: simvastatin  10 mg Oral QHS   Continuous Infusions:   . sodium chloride 150 mL/hr at 11/11/11 0453  . DISCONTD: sodium chloride 250 mL/hr at 11/10/11 1635  . DISCONTD: dextrose 5 % and 0.45% NaCl    . DISCONTD: insulin (NOVOLIN-R) infusion 5.4 Units/hr (11/10/11 1728)   PRN Meds:.dextrose, ibuprofen   Assessment/Plan: 46 y.o man PMH DM 2, HIV/AIDS, HTN, chronic b/l knee pain, erectile dysfunction presented to the ED 8/3 with hyperglycemia.   1. DM 2  -This admission associated with hyperglycemia, glucose elevated to 762.   -fsbs trended down to 158 after treatment with insulin gtt then transition to lantus 10 units and SSI.   -Last HA1C was 7.7 in 09/2011.   -This admission HA1 C was 15.0% -Medication and diet compliance may be an issue -pt is reluctant to get started on insulin again so will try another oral first but HA1C elevated and will probably need sq insulin. -will d/c to continue his Metformin 500 mg bid and add Glipizide XL 5 mg qd  -pt to have clinic appt 11/19/10 at 2:30 PM with IM -pt needs to have a meeting with [name] at f/u and BMP checked.      2. HIV/AIDS -last CD4 ct 30 with viral load ~98K in 07/2011 -pending viral load and cd4 ct -uncertain about compliance of HIV medications given cd4 ct trending down -May consider genotyping in the future if repeat CD 4 ct lower -Consider adding prophylactic treatments for lower cd4 ct -pt has appt with ID 11/12/11 at 9:30 am   3. Chronic pain b/l knees  -Avoid narcotics in this patient d/t h/o abuse -Can Rx Ultram to see if this helps  -Discussed referral to sports medicine  4. HYPERTENSION  -BP 113/61 -controlled -continue home medications Atenolol 50 mg qd, Lisinopril 40 mg qd    5. ERECTILE DYSFUNCTION -cont testosterone gel  6.  HLD (hyperlipidemia)  Lipid Panel     Component Value Date/Time   CHOL 211* 07/24/2011 0911   TRIG 123 07/24/2011 0911   HDL 33* 07/24/2011 0911   CHOLHDL 6.4 07/24/2011 0911   VLDL 25 07/24/2011 0911   LDLCALC 153* 07/24/2011 0911    Pt's  LDL not at goal <100 This needs to be addressed outpatient   7. MRSA + nares -will send home with bactroban tx in nares x 3-5 days bid   8. F/E/N -Hyponatremia noted this admission probably 2/2 to elevated glucose   9. Dispo -home today  -Discussed P4CC services with social worker but pt is not eligible b/c he has medicaid.      LOS: 1 day   Cresenciano Genre 11/11/2011, 2:21 PM

## 2011-11-12 ENCOUNTER — Ambulatory Visit (INDEPENDENT_AMBULATORY_CARE_PROVIDER_SITE_OTHER): Payer: Medicaid Other | Admitting: Internal Medicine

## 2011-11-12 ENCOUNTER — Encounter: Payer: Self-pay | Admitting: Internal Medicine

## 2011-11-12 VITALS — BP 120/81 | HR 84 | Temp 97.7°F | Ht 70.0 in | Wt 247.0 lb

## 2011-11-12 DIAGNOSIS — B20 Human immunodeficiency virus [HIV] disease: Secondary | ICD-10-CM

## 2011-11-12 DIAGNOSIS — E119 Type 2 diabetes mellitus without complications: Secondary | ICD-10-CM

## 2011-11-12 DIAGNOSIS — M25569 Pain in unspecified knee: Secondary | ICD-10-CM

## 2011-11-12 MED ORDER — RITONAVIR 100 MG PO TABS: 100.0000 mg | ORAL_TABLET | Freq: Every day | ORAL | Status: DC

## 2011-11-12 MED ORDER — EMTRICITABINE-TENOFOVIR DF 200-300 MG PO TABS: 1.0000 | ORAL_TABLET | Freq: Every day | ORAL | Status: DC

## 2011-11-12 MED ORDER — RALTEGRAVIR POTASSIUM 400 MG PO TABS: 400.0000 mg | ORAL_TABLET | Freq: Two times a day (BID) | ORAL | Status: DC

## 2011-11-12 MED ORDER — DAPSONE 100 MG PO TABS: 100.0000 mg | ORAL_TABLET | Freq: Every day | ORAL | Status: DC

## 2011-11-12 MED ORDER — DARUNAVIR ETHANOLATE 800 MG PO TABS: 800.0000 mg | ORAL_TABLET | Freq: Every day | ORAL | Status: DC

## 2011-11-12 NOTE — Progress Notes (Signed)
  Subjective:    [name], male    DOB: Oct 20, 1965, 46 y.o.   MRN: [id number]  HPI She comes in here for followup after hospitalization. He has a long history of HIV and poor compliance and should be on a regimen of Reyataz, Norvir, Truvada and Isentress.  Initially, he did endorse compliance however after further discussion it does sound like he has really not been taking his medications at all. His last CD4 count was 30 with a high viral load and his viral load repeat from the hospital is pending. A CD4 count was drawn but was not able to be done. He was admitted hospital 2 to be high blood sugar and did also complain of his chronic knee pain. His blood sugar did come down with therapy and he does ask about Percocet for his knee pain.   Review of Systems  Constitutional: Negative for fever, chills, fatigue and unexpected weight change.  HENT: Negative for sore throat and trouble swallowing.   Respiratory: Negative for cough and shortness of breath.   Cardiovascular: Negative for chest pain, palpitations and leg swelling.  Gastrointestinal: Negative for nausea, abdominal pain and diarrhea.  Musculoskeletal: Positive for arthralgias. Negative for myalgias and joint swelling.       Objective:   Physical Exam  Constitutional: He appears well-developed and well-nourished. No distress.  HENT:  Mouth/Throat: Oropharynx is clear and moist. No oropharyngeal exudate.  Cardiovascular: Normal rate, regular rhythm and normal heart sounds.  Exam reveals no gallop and no friction rub.   No murmur heard. Pulmonary/Chest: Effort normal and breath sounds normal. No respiratory distress. He has no wheezes.  Lymphadenopathy:    He has no cervical adenopathy.          Assessment & Plan:

## 2011-11-12 NOTE — Assessment & Plan Note (Signed)
He has a follow up appointment with his primary physician for next week and was encouraged to make that appointment

## 2011-11-12 NOTE — Discharge Summary (Signed)
Poor insight.  I spent a long time trying to persuade him to re-consider insulin.  This conversation will likely need to be resumed in follow-up.

## 2011-11-12 NOTE — Assessment & Plan Note (Signed)
He does have chronic knee pain which is unchanged. He did ask for Percocet however I suggested anti-inflammatories and to take Motrin or Tylenol.

## 2011-11-12 NOTE — Assessment & Plan Note (Signed)
He does have poor compliance. I did discuss with him that his health is in jeopardy without taking his medications. He did voice his understanding. He does need prophylactic medication however is not interested and would rather just start his HIV medications. He reports that he is going to start taking them daily. He will return in 2 months to see his primary provider with labs 2 weeks before.

## 2011-11-13 ENCOUNTER — Other Ambulatory Visit: Payer: Self-pay | Admitting: *Deleted

## 2011-11-13 DIAGNOSIS — B20 Human immunodeficiency virus [HIV] disease: Secondary | ICD-10-CM

## 2011-11-13 LAB — HIV-1 RNA QUANT-NO REFLEX-BLD
HIV 1 RNA Quant: 304 copies/mL — ABNORMAL HIGH (ref <20)
HIV-1 RNA Quant, Log: 2.48 {Log} — ABNORMAL HIGH (ref <1.30)

## 2011-11-13 MED ORDER — RITONAVIR 100 MG PO TABS: 100.0000 mg | ORAL_TABLET | Freq: Every day | ORAL | Status: DC

## 2011-11-13 MED ORDER — DAPSONE 100 MG PO TABS: 100.0000 mg | ORAL_TABLET | Freq: Every day | ORAL | Status: DC

## 2011-11-13 MED ORDER — DARUNAVIR ETHANOLATE 800 MG PO TABS: 800.0000 mg | ORAL_TABLET | Freq: Every day | ORAL | Status: DC

## 2011-11-13 MED ORDER — RALTEGRAVIR POTASSIUM 400 MG PO TABS: 400.0000 mg | ORAL_TABLET | Freq: Two times a day (BID) | ORAL | Status: DC

## 2011-11-13 MED ORDER — EMTRICITABINE-TENOFOVIR DF 200-300 MG PO TABS: 1.0000 | ORAL_TABLET | Freq: Every day | ORAL | Status: DC

## 2011-11-14 ENCOUNTER — Other Ambulatory Visit: Payer: Self-pay | Admitting: Licensed Clinical Social Worker

## 2011-11-14 NOTE — Telephone Encounter (Signed)
Patient's medication went electronically to [facility] and returned back as an error because they are closed. I called the patient to see what pharmacy I could send it to and his voicemail was full and could not take messages.

## 2011-11-15 ENCOUNTER — Other Ambulatory Visit: Payer: Self-pay | Admitting: *Deleted

## 2011-11-15 DIAGNOSIS — B20 Human immunodeficiency virus [HIV] disease: Secondary | ICD-10-CM

## 2011-11-15 MED ORDER — DARUNAVIR ETHANOLATE 800 MG PO TABS: 800.0000 mg | ORAL_TABLET | Freq: Every day | ORAL | Status: DC

## 2011-11-15 MED ORDER — RITONAVIR 100 MG PO TABS: 100.0000 mg | ORAL_TABLET | Freq: Every day | ORAL | Status: DC

## 2011-11-15 MED ORDER — DAPSONE 100 MG PO TABS: 100.0000 mg | ORAL_TABLET | Freq: Every day | ORAL | Status: DC

## 2011-11-15 MED ORDER — EMTRICITABINE-TENOFOVIR DF 200-300 MG PO TABS: 1.0000 | ORAL_TABLET | Freq: Every day | ORAL | Status: DC

## 2011-11-15 MED ORDER — RALTEGRAVIR POTASSIUM 400 MG PO TABS: 400.0000 mg | ORAL_TABLET | Freq: Two times a day (BID) | ORAL | Status: DC

## 2011-11-19 ENCOUNTER — Ambulatory Visit (INDEPENDENT_AMBULATORY_CARE_PROVIDER_SITE_OTHER): Payer: Medicaid Other | Admitting: Internal Medicine

## 2011-11-19 ENCOUNTER — Encounter: Payer: Self-pay | Admitting: Internal Medicine

## 2011-11-19 VITALS — BP 126/80 | HR 110 | Temp 97.0°F | Ht 70.0 in | Wt 244.3 lb

## 2011-11-19 DIAGNOSIS — R7309 Other abnormal glucose: Secondary | ICD-10-CM

## 2011-11-19 DIAGNOSIS — IMO0001 Reserved for inherently not codable concepts without codable children: Secondary | ICD-10-CM

## 2011-11-19 DIAGNOSIS — R739 Hyperglycemia, unspecified: Secondary | ICD-10-CM

## 2011-11-19 DIAGNOSIS — E1165 Type 2 diabetes mellitus with hyperglycemia: Secondary | ICD-10-CM

## 2011-11-19 DIAGNOSIS — IMO0002 Reserved for concepts with insufficient information to code with codable children: Secondary | ICD-10-CM

## 2011-11-19 DIAGNOSIS — B20 Human immunodeficiency virus [HIV] disease: Secondary | ICD-10-CM

## 2011-11-19 DIAGNOSIS — Z21 Asymptomatic human immunodeficiency virus [HIV] infection status: Secondary | ICD-10-CM

## 2011-11-19 LAB — GLUCOSE, CAPILLARY: Glucose-Capillary: >600 mg/dL (ref 70–99)

## 2011-11-19 LAB — BASIC METABOLIC PANEL
BUN: 19 mg/dL (ref 6–23)
CO2: 26 mEq/L (ref 19–32)
Calcium: 9.8 mg/dL (ref 8.4–10.5)
Chloride: 84 mEq/L — ABNORMAL LOW (ref 96–112)
Creat: 0.82 mg/dL (ref 0.50–1.35)
Glucose, Bld: 850 mg/dL (ref 70–99)
Potassium: 4.8 mEq/L (ref 3.5–5.3)
Sodium: 123 mEq/L — ABNORMAL LOW (ref 135–145)

## 2011-11-19 MED ORDER — INSULIN ASPART 100 UNIT/ML ~~LOC~~ SOLN
10.0000 [IU] | Freq: Once | SUBCUTANEOUS | Status: AC
  Administered 2011-11-19: 10 [IU] via SUBCUTANEOUS

## 2011-11-19 NOTE — Assessment & Plan Note (Signed)
Pt was seen for f/u from recent hospitalization on 11/10/11 for hyperglycemia. He was found to have BGs over 600 on presentation. Pt was mentating well, not orthostatic, and didn't have kussmal breathing. Pt reported polydipsia and polyuria for past couple of days. He reports he only takes metformin. Last HgbA1c 15 on 8/13. -pt was not orally hydrated while in the clinic -stat Bmet was done and pt had anion gap of 13 with pseudohyponatremia 2/2 hyperglycemia (BG 850) kidney function was normal, pt was re-evaluated and still mentating well and able to tolerate fluids -pt given 10 units of Novolog after Bmet  -pt educated on use of Lantus and instructed to start 25 units at bedtime today and everyday after -f/u on Friday of this week to see BGs and re-evaluate for better DM management -pt was advised if symptoms worsen or develops fever to seek medical attention

## 2011-11-19 NOTE — Patient Instructions (Signed)
It was nice to meet you.   Your sugars were very high today and we need to get some better control. It will be good to continue the Metformin that you are taking and then take the insulin we gave you. Please take the Lantus 25 units at bedtime. I will like to see you on Friday 11/23/11 to see how your sugars are doing and start on a better plan for your medications.   If you feel worse of develop a fever please go to the ED.  Thank you.

## 2011-11-19 NOTE — Progress Notes (Addendum)
Subjective:   [name] male   DOB: [birth year] 46 y.o.   MRN: [name]  HPI: Mr.Keith [name] is a 46 y.o. AA man pmh HIV last CD4 30 on 7/13, DM last HgbA1c 15.0 8/13, HTN, HLD, ED presents for f/u after recent hospitalization on 11/12/11 for hyperglycemia. He reports some non-compliance with medications in the past. He was recently seen in ID clinic where suspicion of non-compliance of HAART meds and pt refused prophylaxis medications. Pt reports feeling fatigued since d/c from hospital, had some diarrhea for past 2 days, unable to eat 2/2 decreased appetite and upset stomach. Pt endorses polydipsia and polyuria. Pt denies fevers/chills, constipation, SOB, DOE, CP, HA, blurry vision, or new rashes. Pt is unaware of what meds he takes but states that "whatever he gets he puts together in one mouth swallow." But he does state that he hasn't taken the new prescription of glipizide since he lost the prescription and has been too fatigued to call or go to the pharmacy. He is asking that taking all his pills is complicated and wished he could just crush them into a powder to drink instead. Pts states that he doesn't have much social support or a care taker that can help him organize his pills. He is overwhelmed by all his medications and understandings of his chronic medical conditions. He denies SI or HI but does explain that he is having some problems dealing and adjusting to his diagnosis of DM. He has animosity to forming friends and feels he can't trust people. He denied any current illicit drug use.    Past Medical History  Diagnosis Date  . HIV (human immunodeficiency virus infection) 2009    CD4 count 100, VL 13800 (05/01/2010)  . Diabetes type 2, uncontrolled     HgA1c 17.6 (04/27/2010)  . Hypertension   . Genital warts   . Erectile dysfunction   . Chronic knee pain     right  . Osteomyelitis     h/o hand  . Diabetes mellitus   . Chronic pain   . AIDS    Current  Outpatient Prescriptions  Medication Sig Dispense Refill  . aspirin 81 MG EC tablet Take 1 tablet (81 mg total) by mouth daily.  30 tablet  5  . atenolol (TENORMIN) 50 MG tablet Take 1 tablet (50 mg total) by mouth daily.  30 tablet  5  . Chlorhexidine Gluconate Cloth 2 % PADS Apply 6 each topically daily at 6 (six) AM.      . dapsone 100 MG tablet Take 1 tablet (100 mg total) by mouth daily.  30 tablet  5  . Darunavir Ethanolate (PREZISTA) 800 MG tablet Take 1 tablet (800 mg total) by mouth daily with breakfast.  30 tablet  5  . emtricitabine-tenofovir (TRUVADA) 200-300 MG per tablet Take 1 tablet by mouth daily.  30 tablet  5  . gabapentin (NEURONTIN) 300 MG capsule Take 1 capsule (300 mg total) by mouth 3 (three) times daily.  180 capsule  4  . glipiZIDE (GLUCOTROL XL) 5 MG 24 hr tablet Take 1 tablet (5 mg total) by mouth daily.  30 tablet  1  . lisinopril (PRINIVIL,ZESTRIL) 40 MG tablet Take 1 tablet (40 mg total) by mouth daily.  30 tablet  5  . meloxicam (MOBIC) 15 MG tablet TAKE ONE TABLET   BY MOUTH   DAILY  30 tablet  0  . metFORMIN (GLUCOPHAGE) 500 MG tablet Take 1 tablet (500 mg  total) by mouth 2 (two) times daily with a meal.  120 tablet  4  . mupirocin ointment (BACTROBAN) 2 % Apply 1 application topically 2 (two) times daily. Use for 3-5 days  22 g    . pravastatin (PRAVACHOL) 20 MG tablet Take 20 mg by mouth every evening.      . raltegravir (ISENTRESS) 400 MG tablet Take 1 tablet (400 mg total) by mouth 2 (two) times daily.  60 tablet  5  . ritonavir (NORVIR) 100 MG TABS Take 1 tablet (100 mg total) by mouth daily with breakfast.  30 tablet  5  . testosterone (ANDROGEL) 50 MG/5GM GEL Place 5 g onto the skin daily.       . traMADol (ULTRAM) 50 MG tablet Take 1 tablet (50 mg total) by mouth every 6 (six) hours as needed for pain.  120 tablet  1   Family History  Problem Relation Age of Onset  . Hypertension Mother   . Arthritis Father   . Hypertension Father   . Hypertension  Brother   . Cancer Maternal Grandmother 44    unknown type of cancer  . Depression Paternal Grandmother    History   Social History  . Marital Status: Single    Spouse Name: N/A    Number of Children: 44  . Years of Education: 90   Social History Main Topics  . Smoking status: Never Smoker   . Smokeless tobacco: Never Used  . Alcohol Use: No  . Drug Use: No  . Sexually Active: Yes -- Male partner(s)     given condoms   Other Topics Concern  . None   Social History Narrative   Worked for the city of [place] for 18 years.Unemployed. Applying for disability.Medicaid patient.   Review of Systems: pertinent listed in HPI  Objective:  Physical Exam: Filed Vitals:   11/19/11 1431  BP: 126/80  Pulse: 110  Temp: 97 F (36.1 C)  TempSrc: Oral  Height: 5\' 10"  (1.778 m)  Weight: 241 lb 4.8 oz (109.453 kg)   General: NAD, well nourished HEENT: poor dentition missing several teeth, no oral lesions, PERRL, EOMI, no scleral icterus Cardiac: tachycardic, regular rhythm, no rubs, murmurs or gallops Pulm: clear to auscultation bilaterally, moving normal volumes of air Abd: soft, nontender, nondistended, BS present Ext: warm and well perfused, no pedal edema Neuro: alert and oriented X3, cranial nerves II-XII grossly intact  Assessment & Plan:  Pt was seen and discussed with Dr. Eppie Gibson and Dr. Cathren Laine.   INTERNAL MEDICINE TEACHING ATTENDING ADDENDUM - Janell Quiet, MD: I personally saw and evaluated Ms Miedema in this clinic visit in conjunction with the resident, Dr. Algis Liming. I have discussed the patient's plan of care with Dr. Algis Liming during this visit. I have confirmed the physical exam findings and have read and agree with the clinic note including the plan.

## 2011-11-22 ENCOUNTER — Encounter: Payer: Self-pay | Admitting: Internal Medicine

## 2011-11-22 ENCOUNTER — Ambulatory Visit (INDEPENDENT_AMBULATORY_CARE_PROVIDER_SITE_OTHER): Payer: Medicaid Other | Admitting: Internal Medicine

## 2011-11-22 VITALS — BP 104/66 | HR 96 | Temp 97.1°F | Ht 70.0 in | Wt 251.0 lb

## 2011-11-22 DIAGNOSIS — IMO0002 Reserved for concepts with insufficient information to code with codable children: Secondary | ICD-10-CM

## 2011-11-22 DIAGNOSIS — G8929 Other chronic pain: Secondary | ICD-10-CM

## 2011-11-22 DIAGNOSIS — E1165 Type 2 diabetes mellitus with hyperglycemia: Secondary | ICD-10-CM

## 2011-11-22 DIAGNOSIS — IMO0001 Reserved for inherently not codable concepts without codable children: Secondary | ICD-10-CM

## 2011-11-22 LAB — GLUCOSE, CAPILLARY: Glucose-Capillary: >600 mg/dL (ref 70–99)

## 2011-11-22 LAB — BASIC METABOLIC PANEL WITH GFR
BUN: 17 mg/dL (ref 6–23)
CO2: 24 mEq/L (ref 19–32)
Calcium: 10 mg/dL (ref 8.4–10.5)
Chloride: 90 mEq/L — ABNORMAL LOW (ref 96–112)
Creat: 0.76 mg/dL (ref 0.50–1.35)
GFR, Est African American: >89 mL/min
GFR, Est Non African American: >89 mL/min
Glucose, Bld: 625 mg/dL (ref 70–99)
Potassium: 4.7 mEq/L (ref 3.5–5.3)
Sodium: 127 mEq/L — ABNORMAL LOW (ref 135–145)

## 2011-11-22 MED ORDER — INSULIN GLARGINE 100 UNIT/ML ~~LOC~~ SOLN: 50.0000 [IU] | Freq: Every day | SUBCUTANEOUS | Status: DC

## 2011-11-22 MED ORDER — INSULIN PEN NEEDLE 31G X 5 MM MISC: Status: DC

## 2011-11-22 NOTE — Assessment & Plan Note (Signed)
Patient returns for followup on new medication changes. Patient continues to report noncompliance and was given himself extra insulin of Lantus to 50 units twice a day instead of daily as prescribed. Patient continues to report polydipsia and polyuria. Patient point-of-care blood glucose above 600 today. -Stat Bmet was performed patient had an ion gap of 13 an actual glucose reading of 625, this is improved from his last visit. -Patient was given oral hydration of 1 L and 10 units of NovoLog while in clinic -Patient was educated on the proper use of his insulin and to take only as prescribed -Patient would very much benefit from a [facility] or home health care to set up medications to help with compliance, make those referrals today -Continue Lantus 50 units at bedtime -Will see patient early next week to reevaluate and continue to try to receive optimal glycemic control

## 2011-11-22 NOTE — Progress Notes (Addendum)
Subjective:   [name] male   DOB: [birth year] 46 y.o.   MRN: [name]  HPI: Keith [name] is a 46 y.o. African American man with a past medical history of poorly controlled diabetes, HIV with noncompliance, and hypertension who presents for reason followup of diabetes medication changes. Patient reports taking Lantus 50 units twice a day and metformin 500 mg twice a day. This is not as prescribed as patient was informed and instructed to take 50 units of Lantus only at bedtime. Patient point-of-care blood glucose was over 600 and stat BMP was collected. Patient initially refused to accept oral hydration but then after explanation and education on patient's condition was compliant. Patient was also given 10 units of NovoLog during the interview. Patient is reports having some bouts of diarrhea early this morning and some increasing dizziness the past day. He continues to have polydipsia of drinking about 4 cups of water every 20 minutes and polyuria. Denies having any fevers or chills, nausea, vomiting, abdominal pain, chest pain, loss of consciousness, headaches, or blurry vision. Patient does have poor support system at home and is still overwhelmed by it medication changes and managing all the pills for his chronic illnesses. He was not able to make contact with the social worker and encouraged him to continue to do so. During this visit patient made some sexually inappropriate comments to physician and staff.    Past Medical History  Diagnosis Date  . HIV (human immunodeficiency virus infection) 2009    CD4 count 100, VL 13800 (05/01/2010)  . Diabetes type 2, uncontrolled     HgA1c 17.6 (04/27/2010)  . Hypertension   . Genital warts   . Erectile dysfunction   . Chronic knee pain     right  . Osteomyelitis     h/o hand  . Diabetes mellitus   . Chronic pain   . AIDS    Current Outpatient Prescriptions  Medication Sig Dispense Refill  . aspirin 81 MG EC tablet Take  1 tablet (81 mg total) by mouth daily.  30 tablet  5  . atenolol (TENORMIN) 50 MG tablet Take 1 tablet (50 mg total) by mouth daily.  30 tablet  5  . Chlorhexidine Gluconate Cloth 2 % PADS Apply 6 each topically daily at 6 (six) AM.      . dapsone 100 MG tablet Take 1 tablet (100 mg total) by mouth daily.  30 tablet  5  . Darunavir Ethanolate (PREZISTA) 800 MG tablet Take 1 tablet (800 mg total) by mouth daily with breakfast.  30 tablet  5  . emtricitabine-tenofovir (TRUVADA) 200-300 MG per tablet Take 1 tablet by mouth daily.  30 tablet  5  . gabapentin (NEURONTIN) 300 MG capsule Take 1 capsule (300 mg total) by mouth 3 (three) times daily.  180 capsule  4  . glipiZIDE (GLUCOTROL XL) 5 MG 24 hr tablet Take 1 tablet (5 mg total) by mouth daily.  30 tablet  1  . insulin glargine (LANTUS SOLOSTAR) 100 UNIT/ML injection Inject 50 Units into the skin at bedtime.  10 mL  12  . Insulin Pen Needle (B-D UF III MINI PEN NEEDLES) 31G X 5 MM MISC Use to inject your Lantus. Once daily. Dx code 250.00  100 each  0  . lisinopril (PRINIVIL,ZESTRIL) 40 MG tablet Take 1 tablet (40 mg total) by mouth daily.  30 tablet  5  . meloxicam (MOBIC) 15 MG tablet TAKE ONE TABLET   BY  MOUTH   DAILY  30 tablet  0  . metFORMIN (GLUCOPHAGE) 500 MG tablet Take 1 tablet (500 mg total) by mouth 2 (two) times daily with a meal.  120 tablet  4  . pravastatin (PRAVACHOL) 20 MG tablet Take 20 mg by mouth every evening.      . raltegravir (ISENTRESS) 400 MG tablet Take 1 tablet (400 mg total) by mouth 2 (two) times daily.  60 tablet  5  . ritonavir (NORVIR) 100 MG TABS Take 1 tablet (100 mg total) by mouth daily with breakfast.  30 tablet  5  . testosterone (ANDROGEL) 50 MG/5GM GEL Place 5 g onto the skin daily.       . traMADol (ULTRAM) 50 MG tablet Take 1 tablet (50 mg total) by mouth every 6 (six) hours as needed for pain.  120 tablet  1   Family History  Problem Relation Age of Onset  . Hypertension Mother   . Arthritis Father     . Hypertension Father   . Hypertension Brother   . Cancer Maternal Grandmother 68    unknown type of cancer  . Depression Paternal Grandmother    History   Social History  . Marital Status: Single    Spouse Name: N/A    Number of Children: 75  . Years of Education: 56   Social History Main Topics  . Smoking status: Never Smoker   . Smokeless tobacco: Never Used  . Alcohol Use: No  . Drug Use: No  . Sexually Active: Yes -- Male partner(s)     given condoms   Other Topics Concern  . None   Social History Narrative   Worked for the city of [place] for 18 years.Unemployed. Applying for disability.Medicaid patient.   Review of Systems: Pertinent as in history of present illness  Objective:  Physical Exam: Filed Vitals:   11/22/11 1345  BP: 104/66  Pulse: 96  Temp: 97.1 F (36.2 C)  TempSrc: Oral  Height: 5\' 10"  (1.778 m)  Weight: 251 lb (113.853 kg)  SpO2: 99%   General: NAD HEENT: PERRL, EOMI, no scleral icterus Cardiac: RRR, no rubs, murmurs or gallops Pulm: clear to auscultation bilaterally, moving normal volumes of air Abd: soft, nontender, nondistended, BS present Ext: warm and well perfused, no pedal edema Neuro: alert and oriented X3, cranial nerves II-XII grossly intact  Assessment & Plan:  Patient was seen and discussed with Dr. Cathren Laine. Patient will followup within the next couple days for reevaluation of diabetes control  INTERNAL MEDICINE TEACHING ATTENDING ADDENDUM - Janell Quiet, MD: I personally saw and evaluated Keith [name] in this clinic visit in conjunction with the resident, Dr. Algis Liming. I have discussed the patient's plan of care with Dr. Algis Liming during this visit. I have confirmed the physical exam findings and have read and agree with the clinic note including the plan.

## 2011-11-22 NOTE — Patient Instructions (Signed)
Glad you came back to your appointment to see [place].  We would like to see you back on Monday or Tuesday to continue working on your diabetes.  PLEASE ONLY TAKE LANTUS 50 UNITS at AT BEDTIME. NO MORE OR EXTRA.   Continue to take all your other medications as normal.   We will work on social work to help you at home with your pills and medicine.   Thank you.

## 2011-11-23 LAB — PRESCRIPTION ABUSE MONITORING 15P, URINE
Amphetamine/Meth: NEGATIVE ng/mL
Barbiturate Screen, Urine: NEGATIVE ng/mL
Benzodiazepine Screen, Urine: NEGATIVE ng/mL
Buprenorphine, Urine: NEGATIVE ng/mL
Cannabinoid Scrn, Ur: NEGATIVE ng/mL
Carisoprodol, Urine: NEGATIVE ng/mL
Cocaine Metabolites: NEGATIVE ng/mL
Creatinine, Urine: 25.31 mg/dL (ref >20.0)
Fentanyl, Ur: NEGATIVE ng/mL
Meperidine, Ur: NEGATIVE ng/mL
Methadone Screen, Urine: NEGATIVE ng/mL
Opiate Screen, Urine: NEGATIVE ng/mL
Oxycodone Screen, Ur: NEGATIVE ng/mL
Propoxyphene: NEGATIVE ng/mL
Tramadol Scrn, Ur: NEGATIVE ng/mL
Zolpidem, Urine: NEGATIVE ng/mL

## 2011-11-25 ENCOUNTER — Telehealth: Payer: Self-pay | Admitting: Licensed Clinical Social Worker

## 2011-11-25 NOTE — Telephone Encounter (Signed)
Keith [name] was referred to CSW to assist with access to community services/referral to [facility].  Pt would benefit from case management with [facility].  CSW attempted to return call to Keith [name].  Pt's voice mailbox currently full, unable to leave message.  CSW will continue to follow, will also mail letter as to not allow significant time to pass.  CSW unsure if pt has already linked with [facility].

## 2011-11-26 NOTE — Telephone Encounter (Signed)
CSW was able to speak with Keith [name] briefly.  Keith [name] was going into a meeting.  Keith [name] states he is linked with [facility], CSW informed Keith [name] to look into their case management services.  Keith [name] states he was wanting a Higher education careers adviser, CSW clarified RN or aide.  Keith [name] states aide.  CSW will discuss with PCP and if medically appropriate will initiate [facility] referral.  CSW provided Keith [name] to office hours, Keith [name] states he will call CSW after meeting.

## 2011-12-30 IMAGING — CR DG KNEE COMPLETE 4+V*R*
4 series · 4 of 4 positions shown · non-contrast
Comparison: 07/18/2010

CLINICAL DATA: Right knee pain, no known injury

RIGHT KNEE - COMPLETE 4+ VIEW

[t knee ap right]
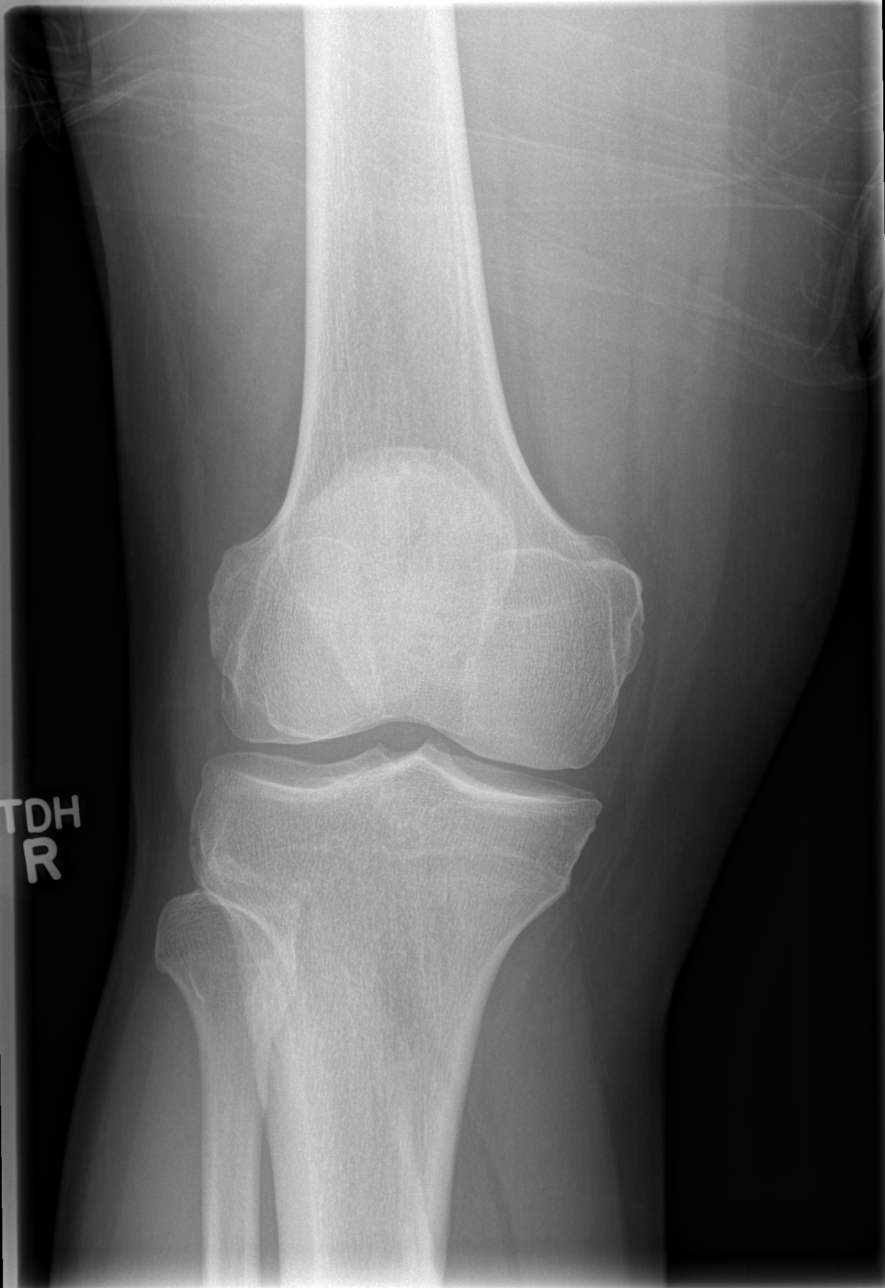

[t knee oblique right (1 of 2)]
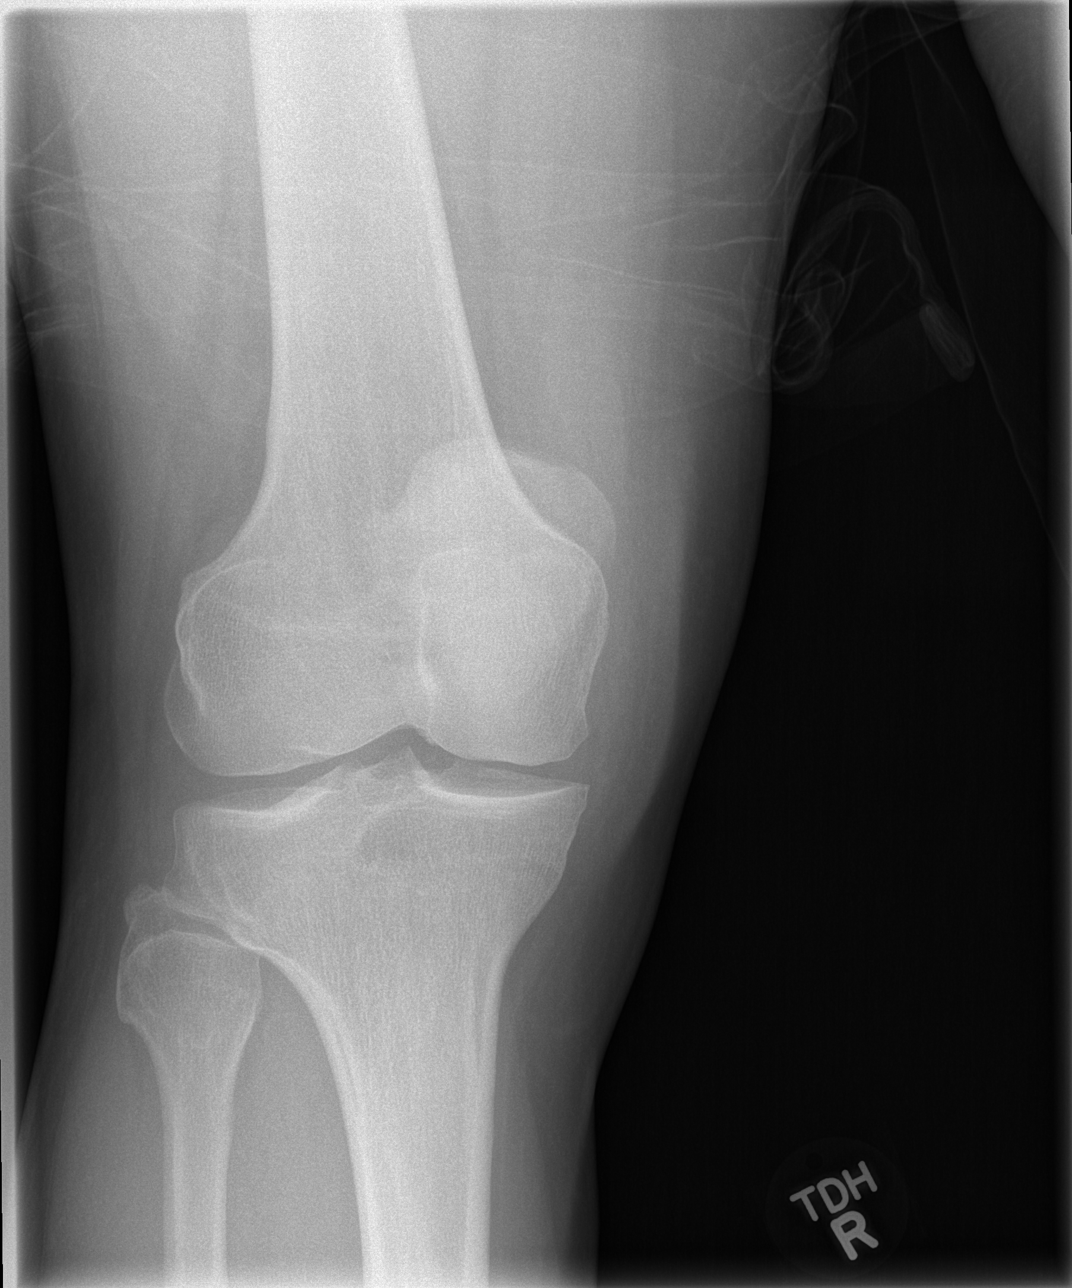

[t knee oblique right (2 of 2)]
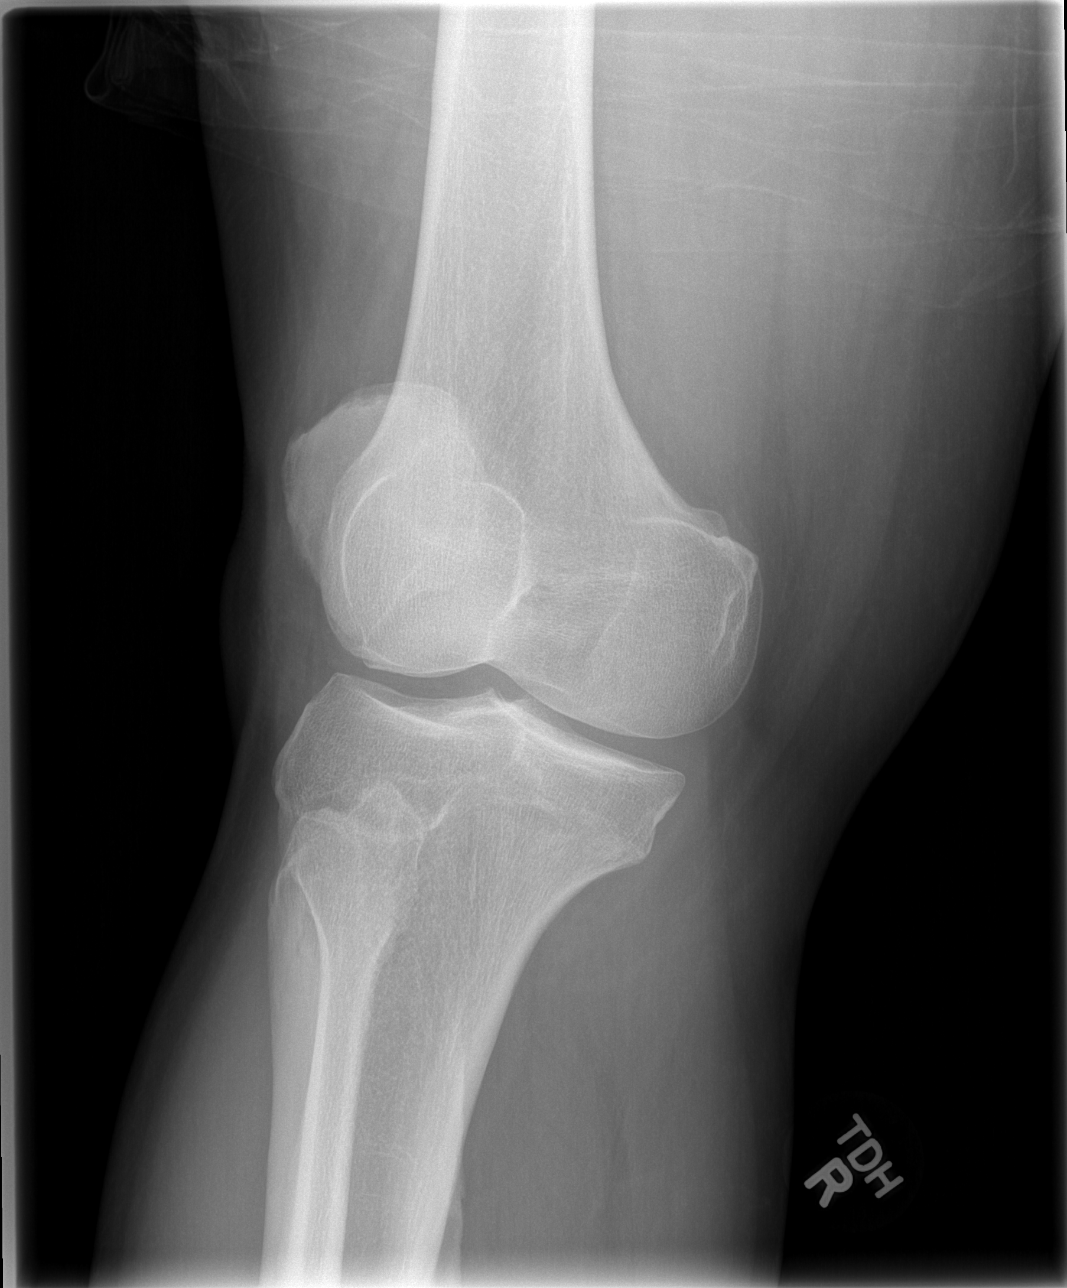

[t knee lat right]
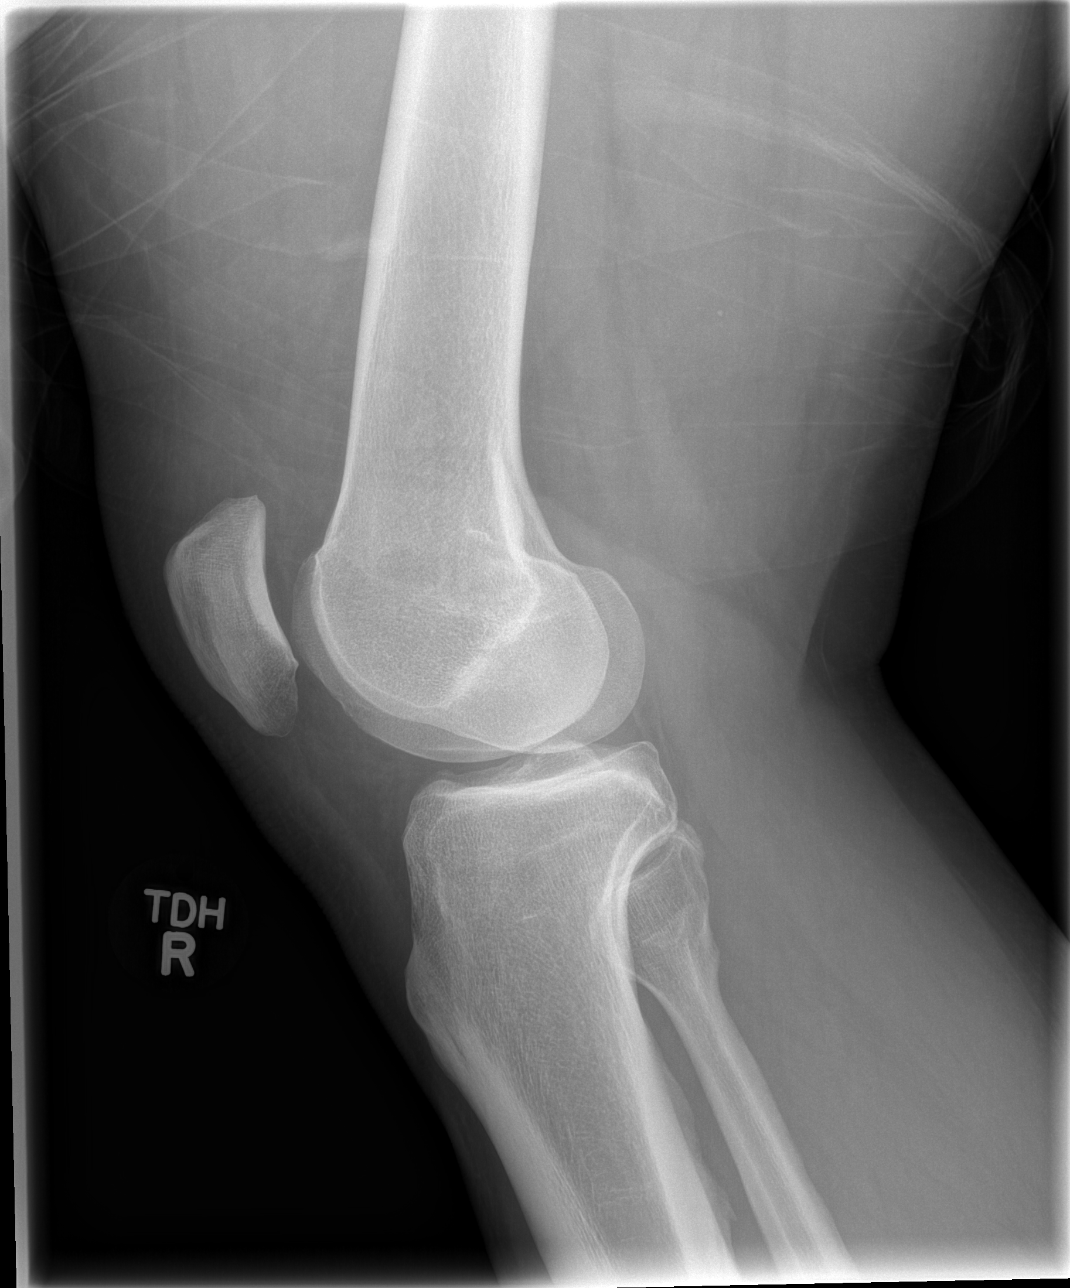

[4 of 4 positions shown; findings below may reference images not displayed]

FINDINGS: No fracture or dislocation is seen.

The joint spaces are preserved.

The visualized soft tissues are unremarkable.  No suprapatellar
knee joint effusion.
IMPRESSION: Normal knee radiographs.

## 2012-01-01 ENCOUNTER — Other Ambulatory Visit: Payer: Medicaid Other

## 2012-01-07 ENCOUNTER — Other Ambulatory Visit: Payer: Self-pay | Admitting: Internal Medicine

## 2012-01-15 ENCOUNTER — Ambulatory Visit: Payer: Medicaid Other | Admitting: Infectious Diseases

## 2012-01-16 ENCOUNTER — Telehealth: Payer: Self-pay | Admitting: *Deleted

## 2012-01-16 ENCOUNTER — Ambulatory Visit: Payer: Medicaid Other | Admitting: Infectious Diseases

## 2012-01-16 NOTE — Telephone Encounter (Signed)
Called and left pt a voice mail to call the clinic to reschedule his appt.  He no showed today. Myrtis Hopping

## 2012-01-19 ENCOUNTER — Encounter (HOSPITAL_COMMUNITY): Payer: Self-pay | Admitting: Adult Health

## 2012-01-19 ENCOUNTER — Emergency Department (HOSPITAL_COMMUNITY)
Admission: EM | Admit: 2012-01-19 | Discharge: 2012-01-19 | Disposition: A | Discharge status: Home / Self Care | Payer: Medicaid Other | Attending: Emergency Medicine | Admitting: Emergency Medicine

## 2012-01-19 DIAGNOSIS — R739 Hyperglycemia, unspecified: Secondary | ICD-10-CM

## 2012-01-19 DIAGNOSIS — B20 Human immunodeficiency virus [HIV] disease: Secondary | ICD-10-CM | POA: Insufficient documentation

## 2012-01-19 DIAGNOSIS — Z794 Long term (current) use of insulin: Secondary | ICD-10-CM | POA: Insufficient documentation

## 2012-01-19 DIAGNOSIS — M25569 Pain in unspecified knee: Secondary | ICD-10-CM | POA: Insufficient documentation

## 2012-01-19 DIAGNOSIS — Z91199 Patient's noncompliance with other medical treatment and regimen due to unspecified reason: Secondary | ICD-10-CM | POA: Insufficient documentation

## 2012-01-19 DIAGNOSIS — E119 Type 2 diabetes mellitus without complications: Secondary | ICD-10-CM | POA: Insufficient documentation

## 2012-01-19 DIAGNOSIS — I10 Essential (primary) hypertension: Secondary | ICD-10-CM | POA: Insufficient documentation

## 2012-01-19 DIAGNOSIS — Z9119 Patient's noncompliance with other medical treatment and regimen: Secondary | ICD-10-CM | POA: Insufficient documentation

## 2012-01-19 DIAGNOSIS — Z9114 Patient's other noncompliance with medication regimen: Secondary | ICD-10-CM

## 2012-01-19 LAB — COMPREHENSIVE METABOLIC PANEL
ALT: 10 U/L (ref 0–53)
AST: 20 U/L (ref 0–37)
Albumin: 3.9 g/dL (ref 3.5–5.2)
Alkaline Phosphatase: 105 U/L (ref 39–117)
BUN: 12 mg/dL (ref 6–23)
CO2: 26 mEq/L (ref 19–32)
Calcium: 10.3 mg/dL (ref 8.4–10.5)
Chloride: 95 mEq/L — ABNORMAL LOW (ref 96–112)
Creatinine, Ser: 0.76 mg/dL (ref 0.50–1.35)
GFR calc Af Amer: >90 mL/min (ref >90)
GFR calc non Af Amer: >90 mL/min (ref >90)
Glucose, Bld: 540 mg/dL — ABNORMAL HIGH (ref 70–99)
Potassium: 4.5 mEq/L (ref 3.5–5.1)
Sodium: 131 mEq/L — ABNORMAL LOW (ref 135–145)
Total Bilirubin: 0.2 mg/dL — ABNORMAL LOW (ref 0.3–1.2)
Total Protein: 8.1 g/dL (ref 6.0–8.3)

## 2012-01-19 LAB — GLUCOSE, CAPILLARY
Glucose-Capillary: 493 mg/dL — ABNORMAL HIGH (ref 70–99)
Glucose-Capillary: 432 mg/dL — ABNORMAL HIGH (ref 70–99)
Glucose-Capillary: 301 mg/dL — ABNORMAL HIGH (ref 70–99)
Glucose-Capillary: 163 mg/dL — ABNORMAL HIGH (ref 70–99)

## 2012-01-19 LAB — CBC
HCT: 38.8 % — ABNORMAL LOW (ref 39.0–52.0)
Hemoglobin: 13.3 g/dL (ref 13.0–17.0)
MCH: 28.5 pg (ref 26.0–34.0)
MCHC: 34.3 g/dL (ref 30.0–36.0)
MCV: 83.3 fL (ref 78.0–100.0)
Platelets: 191 10*3/uL (ref 150–400)
RBC: 4.66 MIL/uL (ref 4.22–5.81)
RDW: 12.6 % (ref 11.5–15.5)
WBC: 4.9 10*3/uL (ref 4.0–10.5)

## 2012-01-19 MED ORDER — SODIUM CHLORIDE 0.9 % IV SOLN: 1000.0000 mL | Freq: Once | INTRAVENOUS | Status: DC

## 2012-01-19 MED ORDER — SODIUM CHLORIDE 0.9 % IV SOLN: 1000.0000 mL | INTRAVENOUS | Status: DC

## 2012-01-19 MED ORDER — INSULIN GLARGINE 100 UNIT/ML ~~LOC~~ SOLN
50.0000 [IU] | Freq: Once | SUBCUTANEOUS | Status: AC
  Administered 2012-01-19: 50 [IU] via SUBCUTANEOUS
  Filled 2012-01-19: qty 1

## 2012-01-19 MED ORDER — INSULIN ASPART 100 UNIT/ML ~~LOC~~ SOLN
15.0000 [IU] | Freq: Once | SUBCUTANEOUS | Status: AC
  Administered 2012-01-19: 15 [IU] via SUBCUTANEOUS
  Filled 2012-01-19: qty 1

## 2012-01-19 MED ORDER — INSULIN ASPART 100 UNIT/ML ~~LOC~~ SOLN
10.0000 [IU] | Freq: Once | SUBCUTANEOUS | Status: AC
  Administered 2012-01-19: 10 [IU] via SUBCUTANEOUS

## 2012-01-19 NOTE — ED Provider Notes (Addendum)
History     CSN: [id number]  Arrival date & time 01/19/12  0116   First MD Initiated Contact with Patient 01/19/12 0129      Chief Complaint  Patient presents with  . Blood Sugar Problem    (Consider location/radiation/quality/duration/timing/severity/associated sxs/prior treatment) HPI 46 year old male history of diabetes, HIV, chronic knee pain presents emergency department complaining of elevated blood sugars. He reports he ran out of his insulin a week ago, and has not been able to get in touch with his doctor to get refill. He endorses polyuria, polydipsia, polyphagia. He reports his right knee has been hurting worse than usual. He denies any fever chills cough abdominal pain. He at one episode of emesis earlier today. He has tolerated fluids since that time.  Past Medical History  Diagnosis Date  . HIV (human immunodeficiency virus infection) 2009    CD4 count 100, VL 13800 (05/01/2010)  . Diabetes type 2, uncontrolled     HgA1c 17.6 (04/27/2010)  . Hypertension   . Genital warts   . Erectile dysfunction   . Chronic knee pain     right  . Osteomyelitis     h/o hand  . Diabetes mellitus   . Chronic pain   . AIDS     History reviewed. No pertinent past surgical history.  Family History  Problem Relation Age of Onset  . Hypertension Mother   . Arthritis Father   . Hypertension Father   . Hypertension Brother   . Cancer Maternal Grandmother 58    unknown type of cancer  . Depression Paternal Grandmother     History  Substance Use Topics  . Smoking status: Never Smoker   . Smokeless tobacco: Never Used  . Alcohol Use: No      Review of Systems  All other systems reviewed and are negative.    Allergies  Sulfa antibiotics  Home Medications   Current Outpatient Rx  Name Route Sig Dispense Refill  . ASPIRIN 81 MG PO TBEC Oral Take 1 tablet (81 mg total) by mouth daily. 30 tablet 5  . ATENOLOL 50 MG PO TABS Oral Take 1 tablet (50 mg total) by mouth  daily. 30 tablet 5  . DAPSONE 100 MG PO TABS Oral Take 1 tablet (100 mg total) by mouth daily. 30 tablet 5  . DARUNAVIR ETHANOLATE 800 MG PO TABS Oral Take 1 tablet (800 mg total) by mouth daily with breakfast. 30 tablet 5  . EMTRICITABINE-TENOFOVIR 200-300 MG PO TABS Oral Take 1 tablet by mouth daily. 30 tablet 5  . GABAPENTIN 300 MG PO CAPS Oral Take 1 capsule (300 mg total) by mouth 3 (three) times daily. 180 capsule 4  . GLIPIZIDE ER 5 MG PO TB24 Oral Take 1 tablet (5 mg total) by mouth daily. 30 tablet 1  . INSULIN GLARGINE 100 UNIT/ML [place] SOLN Subcutaneous Inject 50 Units into the skin at bedtime. 10 mL 12  . LISINOPRIL 40 MG PO TABS Oral Take 1 tablet (40 mg total) by mouth daily. 30 tablet 5  . MELOXICAM 15 MG PO TABS Oral Take 15 mg by mouth daily.    Marland Kitchen METFORMIN HCL 500 MG PO TABS Oral Take 1 tablet (500 mg total) by mouth 2 (two) times daily with a meal. 120 tablet 4  . PRAVASTATIN SODIUM 20 MG PO TABS Oral Take 20 mg by mouth every evening.    Marland Kitchen RALTEGRAVIR POTASSIUM 400 MG PO TABS Oral Take 1 tablet (400 mg total) by mouth 2 (  two) times daily. 60 tablet 5  . RITONAVIR 100 MG PO TABS Oral Take 1 tablet (100 mg total) by mouth daily with breakfast. 30 tablet 5  . TESTOSTERONE 50 MG/5GM TD GEL Transdermal Place 5 g onto the skin daily.       BP 141/86  Pulse 87  Temp 97.8 F (36.6 C) (Oral)  Resp 16  SpO2 99%  Physical Exam  Nursing note and vitals reviewed. Constitutional: He is oriented to person, place, and time. He appears well-developed and well-nourished.  HENT:  Head: Normocephalic and atraumatic.  Nose: Nose normal.  Mouth/Throat: Oropharynx is clear and moist.  Eyes: Conjunctivae normal and EOM are normal. Pupils are equal, round, and reactive to light.  Neck: Normal range of motion. Neck supple. No JVD present. No tracheal deviation present. No thyromegaly present.  Cardiovascular: Normal rate, regular rhythm, normal heart sounds and intact distal pulses.  Exam  reveals no gallop and no friction rub.   No murmur heard. Pulmonary/Chest: Effort normal and breath sounds normal. No stridor. No respiratory distress. He has no wheezes. He has no rales. He exhibits no tenderness.  Abdominal: Soft. Bowel sounds are normal. He exhibits no distension and no mass. There is no tenderness. There is no rebound and no guarding.  Musculoskeletal: Normal range of motion. He exhibits tenderness (Tenderness to palpation of right knee without crepitus or effusion noted). He exhibits no edema.  Lymphadenopathy:    He has no cervical adenopathy.  Neurological: He is alert and oriented to person, place, and time. He exhibits normal muscle tone. Coordination normal.  Skin: Skin is warm and dry. No rash noted. No erythema. No pallor.  Psychiatric: He has a normal mood and affect. His behavior is normal. Judgment and thought content normal.    ED Course  Procedures (including critical care time)  Labs Reviewed  COMPREHENSIVE METABOLIC PANEL - Abnormal; Notable for the following:    Sodium 131 (*)     Chloride 95 (*)     Glucose, Bld 540 (*)     Total Bilirubin 0.2 (*)     All other components within normal limits  CBC - Abnormal; Notable for the following:    HCT 38.8 (*)     All other components within normal limits  GLUCOSE, CAPILLARY - Abnormal; Notable for the following:    Glucose-Capillary 493 (*)     All other components within normal limits  GLUCOSE, CAPILLARY - Abnormal; Notable for the following:    Glucose-Capillary 432 (*)     All other components within normal limits  GLUCOSE, CAPILLARY - Abnormal; Notable for the following:    Glucose-Capillary 301 (*)     All other components within normal limits  GLUCOSE, CAPILLARY - Abnormal; Notable for the following:    Glucose-Capillary 163 (*)     All other components within normal limits   No results found.   1. Hyperglycemia   2. Noncompliance with medication regimen       MDM  46 year old  gentleman with hyperglycemia. He has a normal anion gap. Nursing staff unable to get IV, he is not currently vomiting. We'll have him orally rehydrate while we give him subcutaneous insulin and recheck his sugars. Plan to refill his insulin until he is able to see his primary care Dr.        Kalman Drape, MD 01/19/12 [phone]  Pt should have prescriptions waiting for him at his pharmacy, as he has refills through 2014.  Pt informed  of this, and to go to his pharmacy today for refills.  Kalman Drape, MD 01/19/12 [phone]

## 2012-01-19 NOTE — ED Notes (Signed)
CBG was 493. Notified Nurse Nonie Hoyer.

## 2012-01-19 NOTE — ED Notes (Signed)
Pt reports one week of inability to control blood sugar. Today he c/o emesis times one. He states he has been out of his insulin. Reports increase in thirst and urination. CBG 493. C/o r knee pain . Pt is alert and oriented.

## 2012-02-05 ENCOUNTER — Encounter: Payer: Self-pay | Admitting: Internal Medicine

## 2012-02-05 NOTE — Progress Notes (Signed)
  This patient is a CHRONIC NO-SHOW PATIENT that has a history of HYPERTENSION.  Please make sure to address hypertension during his next clinic appointment, and intervene as appropriate.    Within the AVS, please incorporate the following smartphrase: .htntips   Pertinent Data: BP Readings from Last 3 Encounters:  01/19/12 129/87  11/22/11 104/66  11/19/11 126/80    BMI: Estimated Body mass index is 36.01 kg/(m^2) as calculated from the following:   Height as of 11/22/11: 5\' 10" (1.778 m).   Weight as of 11/22/11: 251 lb(113.853 kg).  Smoking History: History  Smoking status  . Never Smoker   Smokeless tobacco  . Never Used    Last Basic Metabolic Panel:    Component Value Date/Time   NA 131* 01/19/2012 0141   K 4.5 01/19/2012 0141   CL 95* 01/19/2012 0141   CO2 26 01/19/2012 0141   BUN 12 01/19/2012 0141   CREATININE 0.76 01/19/2012 0141   CREATININE 0.76 11/22/2011 1402   GLUCOSE 540* 01/19/2012 0141   CALCIUM 10.3 01/19/2012 0141    Allergies: Allergies  Allergen Reactions  . Sulfa Antibiotics Itching

## 2012-02-23 ENCOUNTER — Emergency Department (HOSPITAL_COMMUNITY)
Admission: EM | Admit: 2012-02-23 | Discharge: 2012-02-23 | Disposition: A | Discharge status: Home / Self Care | Payer: Medicaid Other | Attending: Emergency Medicine | Admitting: Emergency Medicine

## 2012-02-23 ENCOUNTER — Encounter (HOSPITAL_COMMUNITY): Payer: Self-pay | Admitting: *Deleted

## 2012-02-23 DIAGNOSIS — IMO0001 Reserved for inherently not codable concepts without codable children: Secondary | ICD-10-CM | POA: Insufficient documentation

## 2012-02-23 DIAGNOSIS — Z79899 Other long term (current) drug therapy: Secondary | ICD-10-CM | POA: Insufficient documentation

## 2012-02-23 DIAGNOSIS — I10 Essential (primary) hypertension: Secondary | ICD-10-CM | POA: Insufficient documentation

## 2012-02-23 DIAGNOSIS — M1711 Unilateral primary osteoarthritis, right knee: Secondary | ICD-10-CM | POA: Diagnosis present

## 2012-02-23 DIAGNOSIS — G8929 Other chronic pain: Secondary | ICD-10-CM | POA: Insufficient documentation

## 2012-02-23 DIAGNOSIS — B20 Human immunodeficiency virus [HIV] disease: Secondary | ICD-10-CM | POA: Insufficient documentation

## 2012-02-23 DIAGNOSIS — Z794 Long term (current) use of insulin: Secondary | ICD-10-CM | POA: Insufficient documentation

## 2012-02-23 DIAGNOSIS — IMO0002 Reserved for concepts with insufficient information to code with codable children: Secondary | ICD-10-CM

## 2012-02-23 DIAGNOSIS — Z7982 Long term (current) use of aspirin: Secondary | ICD-10-CM | POA: Insufficient documentation

## 2012-02-23 DIAGNOSIS — M25569 Pain in unspecified knee: Secondary | ICD-10-CM | POA: Insufficient documentation

## 2012-02-23 DIAGNOSIS — R739 Hyperglycemia, unspecified: Secondary | ICD-10-CM | POA: Diagnosis present

## 2012-02-23 DIAGNOSIS — M869 Osteomyelitis, unspecified: Secondary | ICD-10-CM | POA: Insufficient documentation

## 2012-02-23 DIAGNOSIS — E1165 Type 2 diabetes mellitus with hyperglycemia: Secondary | ICD-10-CM

## 2012-02-23 LAB — GLUCOSE, CAPILLARY
Glucose-Capillary: 538 mg/dL — ABNORMAL HIGH (ref 70–99)
Glucose-Capillary: 559 mg/dL (ref 70–99)
Glucose-Capillary: 526 mg/dL — ABNORMAL HIGH (ref 70–99)
Glucose-Capillary: 365 mg/dL — ABNORMAL HIGH (ref 70–99)
Glucose-Capillary: 342 mg/dL — ABNORMAL HIGH (ref 70–99)
Glucose-Capillary: 276 mg/dL — ABNORMAL HIGH (ref 70–99)
Glucose-Capillary: 189 mg/dL — ABNORMAL HIGH (ref 70–99)
Glucose-Capillary: 140 mg/dL — ABNORMAL HIGH (ref 70–99)

## 2012-02-23 LAB — BASIC METABOLIC PANEL
BUN: 12 mg/dL (ref 6–23)
CO2: 26 mEq/L (ref 19–32)
Calcium: 9 mg/dL (ref 8.4–10.5)
Chloride: 96 mEq/L (ref 96–112)
Creatinine, Ser: 0.56 mg/dL (ref 0.50–1.35)
GFR calc Af Amer: >90 mL/min (ref >90)
GFR calc non Af Amer: >90 mL/min (ref >90)
Glucose, Bld: 328 mg/dL — ABNORMAL HIGH (ref 70–99)
Potassium: 3.6 mEq/L (ref 3.5–5.1)
Sodium: 131 mEq/L — ABNORMAL LOW (ref 135–145)

## 2012-02-23 LAB — CBC WITH DIFFERENTIAL/PLATELET
Basophils Absolute: 0 10*3/uL (ref 0.0–0.1)
Basophils Relative: 0 % (ref 0–1)
Eosinophils Absolute: 0.1 10*3/uL (ref 0.0–0.7)
Eosinophils Relative: 1 % (ref 0–5)
HCT: 36.8 % — ABNORMAL LOW (ref 39.0–52.0)
Hemoglobin: 12.4 g/dL — ABNORMAL LOW (ref 13.0–17.0)
Lymphocytes Relative: 33 % (ref 12–46)
Lymphs Abs: 2 10*3/uL (ref 0.7–4.0)
MCH: 27 pg (ref 26.0–34.0)
MCHC: 33.7 g/dL (ref 30.0–36.0)
MCV: 80 fL (ref 78.0–100.0)
Monocytes Absolute: 0.4 10*3/uL (ref 0.1–1.0)
Monocytes Relative: 6 % (ref 3–12)
Neutro Abs: 3.6 10*3/uL (ref 1.7–7.7)
Neutrophils Relative %: 59 % (ref 43–77)
Platelets: 176 10*3/uL (ref 150–400)
RBC: 4.6 MIL/uL (ref 4.22–5.81)
RDW: 12.2 % (ref 11.5–15.5)
WBC: 6.1 10*3/uL (ref 4.0–10.5)

## 2012-02-23 LAB — POCT I-STAT, CHEM 8
BUN: 16 mg/dL (ref 6–23)
Calcium, Ion: 1.16 mmol/L (ref 1.12–1.23)
Chloride: 97 mEq/L (ref 96–112)
Creatinine, Ser: 0.8 mg/dL (ref 0.50–1.35)
Glucose, Bld: 502 mg/dL — ABNORMAL HIGH (ref 70–99)
HCT: 39 % (ref 39.0–52.0)
Hemoglobin: 13.3 g/dL (ref 13.0–17.0)
Potassium: 3.9 mEq/L (ref 3.5–5.1)
Sodium: 130 mEq/L — ABNORMAL LOW (ref 135–145)
TCO2: 25 mmol/L (ref 0–100)

## 2012-02-23 LAB — POCT I-STAT 3, VENOUS BLOOD GAS (G3P V)
Acid-Base Excess: 1 mmol/L (ref 0.0–2.0)
Bicarbonate: 27.9 mEq/L — ABNORMAL HIGH (ref 20.0–24.0)
O2 Saturation: 60 %
TCO2: 29 mmol/L (ref 0–100)
pCO2, Ven: 51.5 mmHg — ABNORMAL HIGH (ref 45.0–50.0)
pH, Ven: 7.341 — ABNORMAL HIGH (ref 7.250–7.300)
pO2, Ven: 34 mmHg (ref 30.0–45.0)

## 2012-02-23 LAB — URINALYSIS, ROUTINE W REFLEX MICROSCOPIC
Bilirubin Urine: NEGATIVE
Glucose, UA: >1000 mg/dL — AB
Hgb urine dipstick: NEGATIVE
Ketones, ur: NEGATIVE mg/dL
Leukocytes, UA: NEGATIVE
Nitrite: NEGATIVE
Protein, ur: NEGATIVE mg/dL
Specific Gravity, Urine: 1.036 — ABNORMAL HIGH (ref 1.005–1.030)
Urobilinogen, UA: 0.2 mg/dL (ref 0.0–1.0)
pH: 6 (ref 5.0–8.0)

## 2012-02-23 LAB — URINE MICROSCOPIC-ADD ON

## 2012-02-23 MED ORDER — "INSULIN SYRINGE-NEEDLE U-100 30G X 5/16"" 1 ML MISC": Status: DC

## 2012-02-23 MED ORDER — INSULIN GLARGINE 100 UNIT/ML ~~LOC~~ SOLN: 50.0000 [IU] | Freq: Every day | SUBCUTANEOUS | Status: DC

## 2012-02-23 MED ORDER — SODIUM CHLORIDE 0.9 % IV BOLUS (SEPSIS)
2000.0000 mL | Freq: Once | INTRAVENOUS | Status: AC
  Administered 2012-02-23: 2000 mL via INTRAVENOUS

## 2012-02-23 MED ORDER — SODIUM CHLORIDE 0.9 % IV SOLN
INTRAVENOUS | Status: DC
  Administered 2012-02-23: 5 [IU]/h via INTRAVENOUS
  Filled 2012-02-23: qty 1

## 2012-02-23 NOTE — Progress Notes (Signed)
   CARE MANAGEMENT NOTE 02/23/2012  Patient:  Keith [name], Keith [name]   Account Number:  [id number]  Date Initiated:  02/23/2012  Documentation initiated by:  [facility]  Subjective/Objective Assessment:   HTN, DM     Action/Plan:   Anticipated DC Date:  02/23/2012   Anticipated DC Plan:  [place]  CM consult  Medication Assistance      Choice offered to / List presented to:             Status of service:  Completed, signed off Medicare Important Message given?   (If response is "NO", the following Medicare IM given date fields will be blank) Date Medicare IM given:   Date Additional Medicare IM given:    Discharge Disposition:  HOME/SELF CARE  Per UR Regulation:    If discussed at Long Length of Stay Meetings, dates discussed:    Comments:  02/23/2012 0930 NCM spoke to pt and he does have Medicaid for his medication. States he can afford his $3 copay. He ran out of his meds. NCM explained the importance of follow up with his physician and compliance with meds to help avoid complications. Jonnie Finner RN CCM Case Mgmt phone [phone]

## 2012-02-23 NOTE — ED Notes (Signed)
Blood gas, venous result given to Dr. Florina Ou

## 2012-02-23 NOTE — ED Notes (Signed)
Pt states that for the past 3 days he has been peeing more frequently and this happens when his blood sugar is elevated. Pt states no way to test sugar at home. Pt states was given 3-4 insulin injections last time he was here for high CBG. Pt CBG in triage is 538

## 2012-02-23 NOTE — ED Provider Notes (Addendum)
History     CSN: [id number]  Arrival date & time 02/23/12  [id number]   First MD Initiated Contact with Patient 02/23/12 [phone]      Chief Complaint  Patient presents with  . Hyperglycemia    (Consider location/radiation/quality/duration/timing/severity/associated sxs/prior treatment) HPI This is a 46 year old male with type 2 diabetes HIV disease. He has had polydipsia and polyuria for about the last week. He has been out of his insulin for the past 2 days has been unable to get it refilled. Her sugar was checked in triage and found to be 538. He states he is mildly short of breath. He denies nausea or vomiting. He states he has been compliant with his other medications.  Past Medical History  Diagnosis Date  . HIV (human immunodeficiency virus infection) 2009    CD4 count 100, VL 13800 (05/01/2010)  . Diabetes type 2, uncontrolled     HgA1c 17.6 (04/27/2010)  . Hypertension   . Genital warts   . Erectile dysfunction   . Chronic knee pain     right  . Osteomyelitis     h/o hand  . Diabetes mellitus   . Chronic pain   . AIDS     History reviewed. No pertinent past surgical history.  Family History  Problem Relation Age of Onset  . Hypertension Mother   . Arthritis Father   . Hypertension Father   . Hypertension Brother   . Cancer Maternal Grandmother 80    unknown type of cancer  . Depression Paternal Grandmother     History  Substance Use Topics  . Smoking status: Never Smoker   . Smokeless tobacco: Never Used  . Alcohol Use: No      Review of Systems  All other systems reviewed and are negative.    Allergies  Sulfa antibiotics  Home Medications   Current Outpatient Rx  Name  Route  Sig  Dispense  Refill  . ASPIRIN 81 MG PO TBEC   Oral   Take 1 tablet (81 mg total) by mouth daily.   30 tablet   5   . ATENOLOL 50 MG PO TABS   Oral   Take 1 tablet (50 mg total) by mouth daily.   30 tablet   5   . DAPSONE 100 MG PO TABS   Oral   Take 1 tablet  (100 mg total) by mouth daily.   30 tablet   5   . DARUNAVIR ETHANOLATE 800 MG PO TABS   Oral   Take 1 tablet (800 mg total) by mouth daily with breakfast.   30 tablet   5   . EMTRICITABINE-TENOFOVIR 200-300 MG PO TABS   Oral   Take 1 tablet by mouth daily.   30 tablet   5   . GABAPENTIN 300 MG PO CAPS   Oral   Take 1 capsule (300 mg total) by mouth 3 (three) times daily.   180 capsule   4   . GLIPIZIDE ER 5 MG PO TB24   Oral   Take 1 tablet (5 mg total) by mouth daily.   30 tablet   1   . INSULIN GLARGINE 100 UNIT/ML [place] SOLN   Subcutaneous   Inject 50 Units into the skin at bedtime.   10 mL   12   . LISINOPRIL 40 MG PO TABS   Oral   Take 1 tablet (40 mg total) by mouth daily.   30 tablet   5   . MELOXICAM  15 MG PO TABS   Oral   Take 15 mg by mouth daily.         Marland Kitchen METFORMIN HCL 500 MG PO TABS   Oral   Take 1 tablet (500 mg total) by mouth 2 (two) times daily with a meal.   120 tablet   4   . PRAVASTATIN SODIUM 20 MG PO TABS   Oral   Take 20 mg by mouth every evening.         Marland Kitchen RALTEGRAVIR POTASSIUM 400 MG PO TABS   Oral   Take 1 tablet (400 mg total) by mouth 2 (two) times daily.   60 tablet   5   . RITONAVIR 100 MG PO TABS   Oral   Take 1 tablet (100 mg total) by mouth daily with breakfast.   30 tablet   5   . TESTOSTERONE 50 MG/5GM TD GEL   Transdermal   Place 5 g onto the skin daily.            BP 121/85  Pulse 73  Temp 98.4 F (36.9 C) (Oral)  Resp 18  SpO2 99%  Physical Exam General: Well-developed, well-nourished male in no acute distress; appearance consistent with age of record HENT: normocephalic, atraumatic; dry Eyes: pupils equal round and reactive to light; extraocular muscles intact Neck: supple Heart: regular rate and rhythm; tachycardia Lungs: clear to auscultation bilaterally; distant sound Abdomen: soft; nondistended; nontender; bowel sounds present Extremities: No deformity; full range of  motion Neurologic: Awake, alert and oriented; motor function intact in all extremities and symmetric; no facial droop Skin: Warm and dry Psychiatric: Normal mood and affect    ED Course  Procedures (including critical care time)     MDM   Nursing notes and vitals signs, including pulse oximetry, reviewed.  Summary of this visit's results, reviewed by myself:  Labs:  Results for orders placed during the hospital encounter of 02/23/12  GLUCOSE, CAPILLARY      Component Value Range   Glucose-Capillary 538 (*) 70 - 99 mg/dL   Comment 1 Documented in Chart     Comment 2 Notify RN    CBC WITH DIFFERENTIAL      Component Value Range   WBC 6.1  4.0 - 10.5 K/uL   RBC 4.60  4.22 - 5.81 MIL/uL   Hemoglobin 12.4 (*) 13.0 - 17.0 g/dL   HCT 36.8 (*) 39.0 - 52.0 %   MCV 80.0  78.0 - 100.0 fL   MCH 27.0  26.0 - 34.0 pg   MCHC 33.7  30.0 - 36.0 g/dL   RDW 12.2  11.5 - 15.5 %   Platelets 176  150 - 400 K/uL   Neutrophils Relative 59  43 - 77 %   Neutro Abs 3.6  1.7 - 7.7 K/uL   Lymphocytes Relative 33  12 - 46 %   Lymphs Abs 2.0  0.7 - 4.0 K/uL   Monocytes Relative 6  3 - 12 %   Monocytes Absolute 0.4  0.1 - 1.0 K/uL   Eosinophils Relative 1  0 - 5 %   Eosinophils Absolute 0.1  0.0 - 0.7 K/uL   Basophils Relative 0  0 - 1 %   Basophils Absolute 0.0  0.0 - 0.1 K/uL  GLUCOSE, CAPILLARY      Component Value Range   Glucose-Capillary 559 (*) 70 - 99 mg/dL   Comment 1 Notify RN    POCT I-STAT, CHEM 8  Component Value Range   Sodium 130 (*) 135 - 145 mEq/L   Potassium 3.9  3.5 - 5.1 mEq/L   Chloride 97  96 - 112 mEq/L   BUN 16  6 - 23 mg/dL   Creatinine, Ser 0.80  0.50 - 1.35 mg/dL   Glucose, Bld 502 (*) 70 - 99 mg/dL   Calcium, Ion 1.16  1.12 - 1.23 mmol/L   TCO2 25  0 - 100 mmol/L   Hemoglobin 13.3  13.0 - 17.0 g/dL   HCT 39.0  39.0 - 52.0 %  POCT I-STAT 3, BLOOD GAS (G3P V)      Component Value Range   pH, Ven 7.341 (*) 7.250 - 7.300   pCO2, Ven 51.5 (*) 45.0 - 50.0  mmHg   pO2, Ven 34.0  30.0 - 45.0 mmHg   Bicarbonate 27.9 (*) 20.0 - 24.0 mEq/L   TCO2 29  0 - 100 mmol/L   O2 Saturation 60.0     Acid-Base Excess 1.0  0.0 - 2.0 mmol/L   Sample type VENOUS     Comment NOTIFIED PHYSICIAN    GLUCOSE, CAPILLARY      Component Value Range   Glucose-Capillary 526 (*) 70 - 99 mg/dL  GLUCOSE, CAPILLARY      Component Value Range   Glucose-Capillary 365 (*) 70 - 99 mg/dL   7:30 AM Patient on insulin drip per Glucose Stabilizer.         Wynetta Fines, MD 02/23/12 [id number]  Wynetta Fines, MD 02/23/12 0730

## 2012-02-23 NOTE — ED Notes (Signed)
CBG CHECKED 538

## 2012-02-23 NOTE — ED Notes (Signed)
Pt was d/c by Dr. Blaine Hamper.  This RN did not d/c the pt.

## 2012-02-23 NOTE — ED Notes (Signed)
Obtained and recorded CBG of 526. Patient asked if he could have some peanut butter crackers and I replied that I did not think so but I would ask and return with an answer. Returned and told patient that he would be unable to have those for a while yet.

## 2012-02-23 NOTE — Consult Note (Signed)
Hospital Consult Note Date: 02/23/2012  Patient name: Keith [name] Medical record number: [name] Date of birth: 01/19/1966 Age: 46 y.o. Gender: male PCP: Clinton Gallant, MD  Medical Service: Internal Medicine Teaching Service  Attending physician:  Dr. Ellwood Dense   1st Contact:  Dr. Hayes Ludwig     Pager:[phone] 2nd Contact:  Dr. Blaine Hamper   Pager:[phone] After 5 pm or weekends: 1st Contact:      Pager: [phone] 2nd Contact:      Pager: [phone]  Chief Complaint: High blood sugars, ran out of insulin for one week  History of Present Illness: [name] is a 46 year old man with PMH of HIV, DM2, hyperlipidemia, HTN, who comes in to the [facility] ED for evaluation of high blood glucose. He states that he has no acute problems but had run out of his insulin one week ago. He states that his pharmacy would not dispense his insulin because they could not reach his PCP. Of note, he was supposed to follow up with the [facility] in late August but he has never returned to the clinic. During his last visit, his Lantus was increased to 50units qHS. Golden Hurter, the [facility] CSW has contacted him multiple times but has been unable to reach him by phone. He needed [facility] aide for assistance with his insulin. He tells me that he was "fine" and did no feel like he needed to be seen in the clinic. He currently checks his CBGs twice per day, before breakfast and before lunch.   He also complains of right knee pain that is chronic, and has been present for years now. He requests Percocet. He was instructed to follow up with the [facility] for this chronic problem.   In the ED, his CBG initially was 538, he was given 1L NS bolus x2, with NS infusion at 177ml/hr, and was started on an insulin drip. His CBG currently is 140.   Dr. Ellwood Dense has evaluated [name] and recommends discharge home from the ED with follow up at the [facility]. He was given prescription for Lantus 50 units qHS and instructed to check his CBG three times daily (before breakfast,  lunch, and at bedtime). He was also advised to call the [facility] for a follow up appointment. He understood and agreed to this plan; he stated that he would stop by the pharmacy as soon as he left the hospital.   Meds: Current Outpatient Rx  Name  Route  Sig  Dispense  Refill  . ASPIRIN 81 MG PO TBEC   Oral   Take 1 tablet (81 mg total) by mouth daily.   30 tablet   5   . ATENOLOL 50 MG PO TABS   Oral   Take 1 tablet (50 mg total) by mouth daily.   30 tablet   5   . DAPSONE 100 MG PO TABS   Oral   Take 1 tablet (100 mg total) by mouth daily.   30 tablet   5   . DARUNAVIR ETHANOLATE 800 MG PO TABS   Oral   Take 1 tablet (800 mg total) by mouth daily with breakfast.   30 tablet   5   . EMTRICITABINE-TENOFOVIR 200-300 MG PO TABS   Oral   Take 1 tablet by mouth daily.   30 tablet   5   . GABAPENTIN 300 MG PO CAPS   Oral   Take 1 capsule (300 mg total) by mouth 3 (three) times daily.   180 capsule  4   . GLIPIZIDE ER 5 MG PO TB24   Oral   Take 1 tablet (5 mg total) by mouth daily.   30 tablet   1   . LISINOPRIL 40 MG PO TABS   Oral   Take 1 tablet (40 mg total) by mouth daily.   30 tablet   5   . MELOXICAM 15 MG PO TABS   Oral   Take 15 mg by mouth daily.         Marland Kitchen METFORMIN HCL 500 MG PO TABS   Oral   Take 1 tablet (500 mg total) by mouth 2 (two) times daily with a meal.   120 tablet   4   . PRAVASTATIN SODIUM 20 MG PO TABS   Oral   Take 20 mg by mouth every evening.         Marland Kitchen RALTEGRAVIR POTASSIUM 400 MG PO TABS   Oral   Take 1 tablet (400 mg total) by mouth 2 (two) times daily.   60 tablet   5   . RITONAVIR 100 MG PO TABS   Oral   Take 1 tablet (100 mg total) by mouth daily with breakfast.   30 tablet   5   . TESTOSTERONE 50 MG/5GM TD GEL   Transdermal   Place 5 g onto the skin daily.          . INSULIN GLARGINE 100 UNIT/ML [place] SOLN   Subcutaneous   Inject 50 Units into the skin at bedtime.   10 mL   12     Diagnosis code is  250.00 for DM-II.   Marland Kitchen INSULIN SYRINGE-NEEDLE U-100 30G X 5/16" 1 ML MISC      diagnosis code is 250.00 for DM-II. Use it for insulin injection.   100 each   11     Allergies: Allergies as of 02/23/2012 - Review Complete 02/23/2012  Allergen Reaction Noted  . Sulfa antibiotics Itching 03/26/2011   Past Medical History  Diagnosis Date  . HIV (human immunodeficiency virus infection) 2009    CD4 count 100, VL 13800 (05/01/2010)  . Diabetes type 2, uncontrolled     HgA1c 17.6 (04/27/2010)  . Hypertension   . Genital warts   . Erectile dysfunction   . Chronic knee pain     right  . Osteomyelitis     h/o hand  . Diabetes mellitus   . Chronic pain   . AIDS    History reviewed. No pertinent past surgical history. Family History  Problem Relation Age of Onset  . Hypertension Mother   . Arthritis Father   . Hypertension Father   . Hypertension Brother   . Cancer Maternal Grandmother 54    unknown type of cancer  . Depression Paternal Grandmother    History   Social History  . Marital Status: Single    Spouse Name: N/A    Number of Children: 101  . Years of Education: 12   Occupational History  . Not on file.   Social History Main Topics  . Smoking status: Never Smoker   . Smokeless tobacco: Never Used  . Alcohol Use: No  . Drug Use: No  . Sexually Active: Yes -- Male partner(s)     Comment: given condoms   Other Topics Concern  . Not on file   Social History Narrative   Worked for the city of [place] for 18 years.Unemployed. Applying for disability.Medicaid patient.    Review of Systems: Pertinent items  are noted in HPI.  Physical Exam: Blood pressure 121/85, pulse 73, temperature 98.4 F (36.9 C), temperature source Oral, resp. rate 18, SpO2 99.00%. BP 121/85  Pulse 73  Temp 98.4 F (36.9 C) (Oral)  Resp 18  SpO2 99%  General Appearance:    Alert, cooperative, no distress, appears stated age  Head:    Normocephalic, without obvious  abnormality, atraumatic  Eyes:    PERRL, conjunctiva/corneas clear,           Nose:   Nares normal, septum midline, mucosa normal, no drainage    Throat:   Lips, mucosa, and tongue normal; poor dentition  Neck:   Supple, symmetrical, trachea midline  Back:     Symmetric, no curvature, ROM normal, no CVA tenderness  Lungs:     Clear to auscultation bilaterally, respirations unlabored  Chest wall:    No tenderness or deformity  Heart:    Regular rate and rhythm, S1 and S2 normal, no murmur, rub   or gallop  Abdomen:     Soft, non-tender, bowel sounds active all four quadrants,    no masses, no organomegaly        Extremities:   Extremities normal, atraumatic, no cyanosis or edema. Right knee with no erythema, edema, or tenderness, and normal ROM.   Pulses:   2+ and symmetric all extremities  Skin:   Skin color, texture, turgor normal, no rashes or lesions  Lymph nodes:   Cervical, supraclavicular, and axillary nodes normal  Neurologic:   No Neurologic deficits noted    Lab results: Basic Metabolic Panel:  Basename 02/23/12 0832 02/23/12 0540  NA 131* 130*  K 3.6 3.9  CL 96 97  CO2 26 --  GLUCOSE 328* 502*  BUN 12 16  CREATININE 0.56 0.80  CALCIUM 9.0 --  MG -- --  PHOS -- --   CBC:  Basename 02/23/12 0540 02/23/12 0442  WBC -- 6.1  NEUTROABS -- 3.6  HGB 13.3 12.4*  HCT 39.0 36.8*  MCV -- 80.0  PLT -- 176   CBG:  Basename 02/23/12 1155 02/23/12 1051 02/23/12 0940 02/23/12 0757 02/23/12 0646 02/23/12 0557  GLUCAP 140* 189* 276* 342* 365* 526*   Urine Drug Screen: Drugs of Abuse     Component Value Date/Time   LABOPIA NEG 11/22/2011 1402   LABOPIA NEG 08/07/2011 1217   COCAINSCRNUR NEG 11/22/2011 1402   COCAINSCRNUR NONE DETECTED 11/03/2010 2122   LABBENZ NEG 11/22/2011 1402   LABBENZ NEG 08/07/2011 1217   LABBENZ NONE DETECTED 11/03/2010 2122   AMPHETMU NEG 08/07/2011 1217   AMPHETMU NONE DETECTED 11/03/2010 2122   THCU NONE DETECTED 11/03/2010 2122   LABBARB NEG  11/22/2011 1402   LABBARB NONE DETECTED 11/03/2010 2122    Urinalysis:  Basename 02/23/12 0901  COLORURINE YELLOW  LABSPEC 1.036*  PHURINE 6.0  GLUCOSEU >1000*  HGBUR NEGATIVE  BILIRUBINUR NEGATIVE  KETONESUR NEGATIVE  PROTEINUR NEGATIVE  UROBILINOGEN 0.2  NITRITE NEGATIVE  LEUKOCYTESUR NEGATIVE   Other results: EKG: Normal sinus rhythm, no axis deviation, no ST changes.  Assessment & Plan by Problem: Hyperglycemia: He states being "out" of his Lantus for over one week. He will certainly need [place] help (aide) for continued insulin therapy. He was instructed to contact the [facility], to follow up and to also follow up with [facility] and Golden Hurter. He has not been to the [facility] since 8/16 in which time his Lantus dose had been increased form 25 units qHS to 50  units qHS. Today he will be discharged from the ED with prescription for Lantus home regimen and for CBG checks 3 times per day (before breakfast, lunch, and at bedtime). He agreed and assured the team that he will follow up with the [facility].   Right knee pain, arthritis: He states that he has had arthritis of the right knee for years now after a fall years ago and intense exercise, playing basketball. He takes Meloxicam at home but requested Pecocet today as he had this prescribed to him in the past. He denied increased pain in his knee, and the joint is not swollen, tender, or erythatmatous. I explained to him that it would be inappropriate for me to give him Percocet prescription for this chronic problem and he should follow up with the [facility] for further pain management. He agreed.   Dispo: He is being discharged home from ED with instruction to continue follow up at the [facility]. He was given prescription for Lantus.   The patient does have current PCP Algis Liming, Alinda Sierras, MD), therefore will be require OPC follow-up after discharge.   The patient does not have transportation limitations that hinder transportation to clinic  appointments.  SignedBlain Pais 02/23/2012, 12:28 PM    ------------------------------------------------------------------------------------------------------ Internal Medicine Teaching Service Attending Cosign Note Date: 02/23/2012  Patient name: DELOY BLOCH  Medical record number: [name]  Date of birth: [birth year]   I have seen and evaluated [name], reviewed vitals and labs, and I think it is okay to discharge him at this time. I discussed their discharge with Dr. Blaine Hamper and Dr. Hayes Ludwig. I agree with the discharge plan. I personally discussed the plan with the patient. The patient will follow up with his outpatient provider on 02/26/12.    Madilyn Fireman, MD 11/17/20131:54 PM

## 2012-02-23 NOTE — ED Notes (Signed)
Urine sample labeled and saved at the bedside; no orders at this time.  RN Thedore Mins informed.

## 2012-03-02 IMAGING — CR DG CHEST 2V
2 series · 2 of 2 positions shown · non-contrast
Comparison: 08/02/2010

CLINICAL DATA: Left-sided chest pain

CHEST - 2 VIEW

[w chest pa]
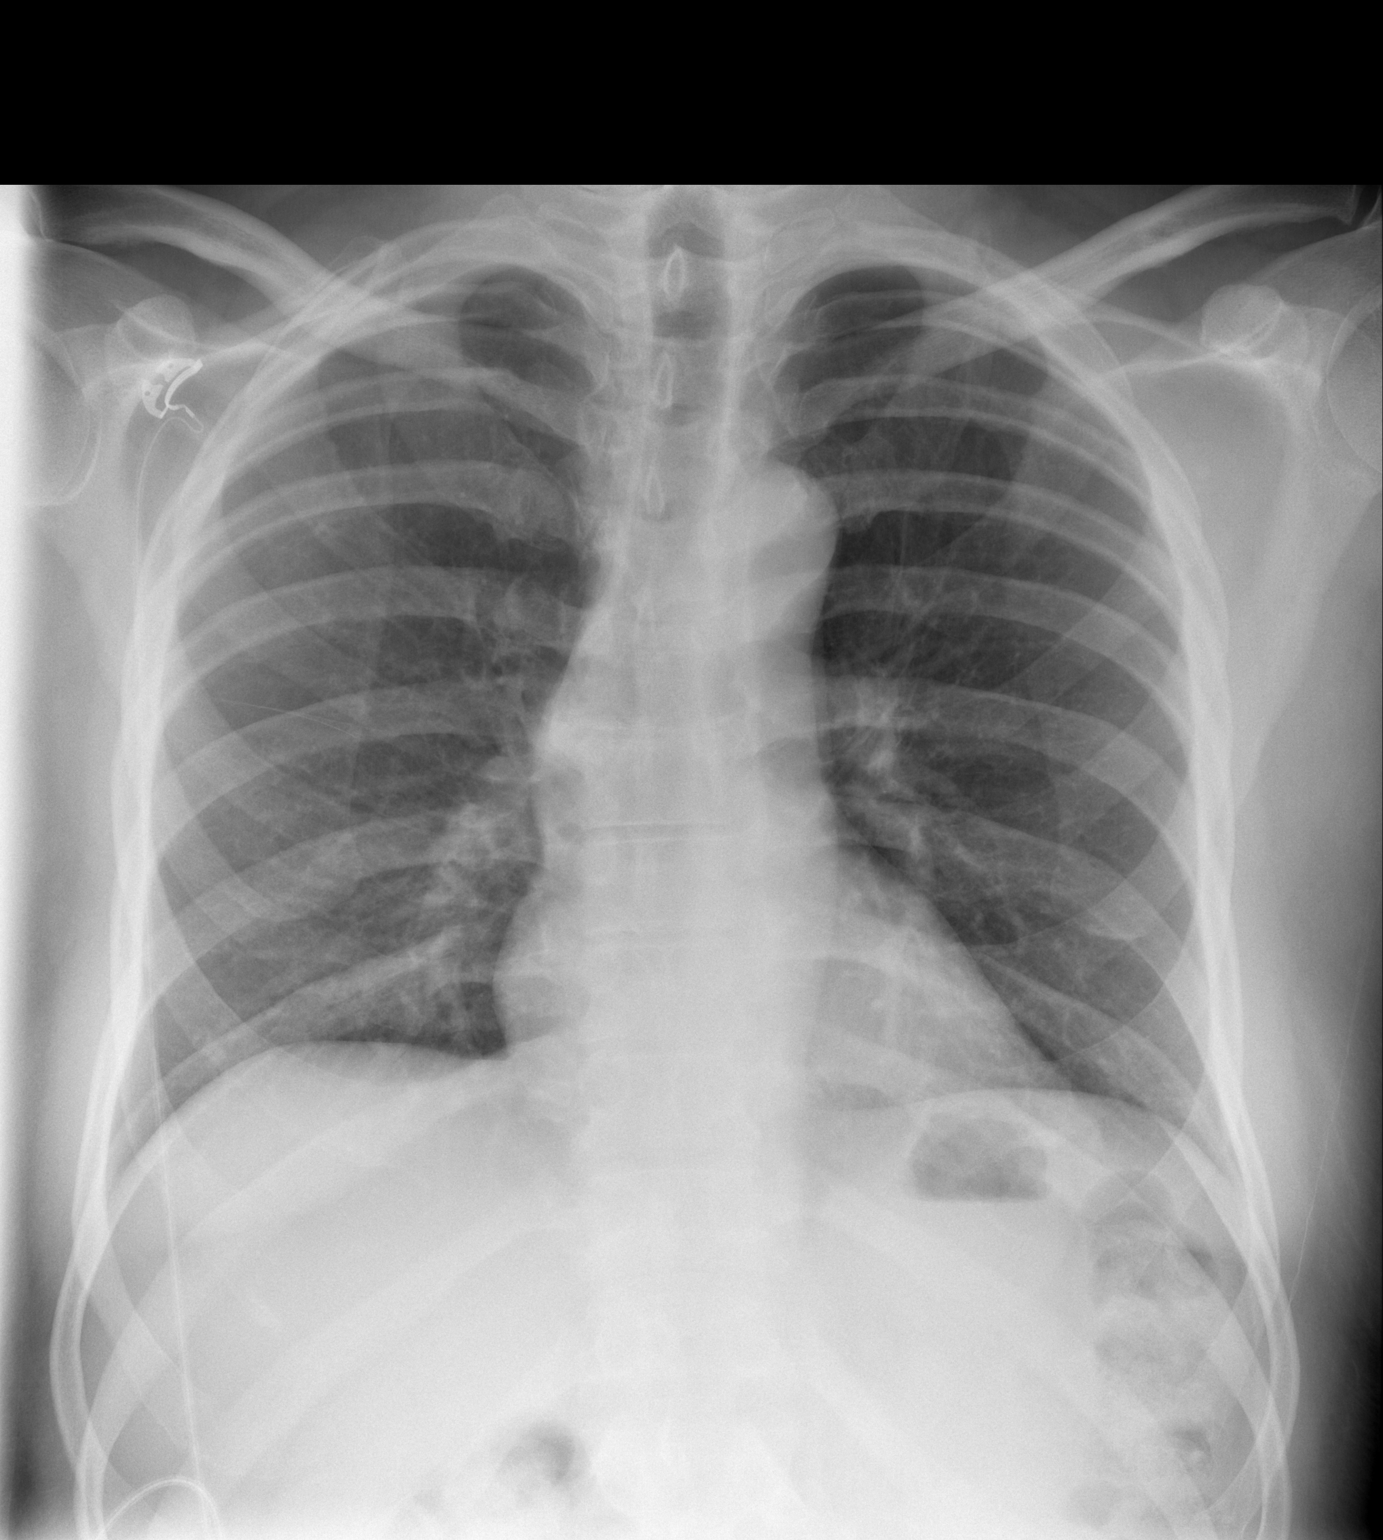

[w chest lat]
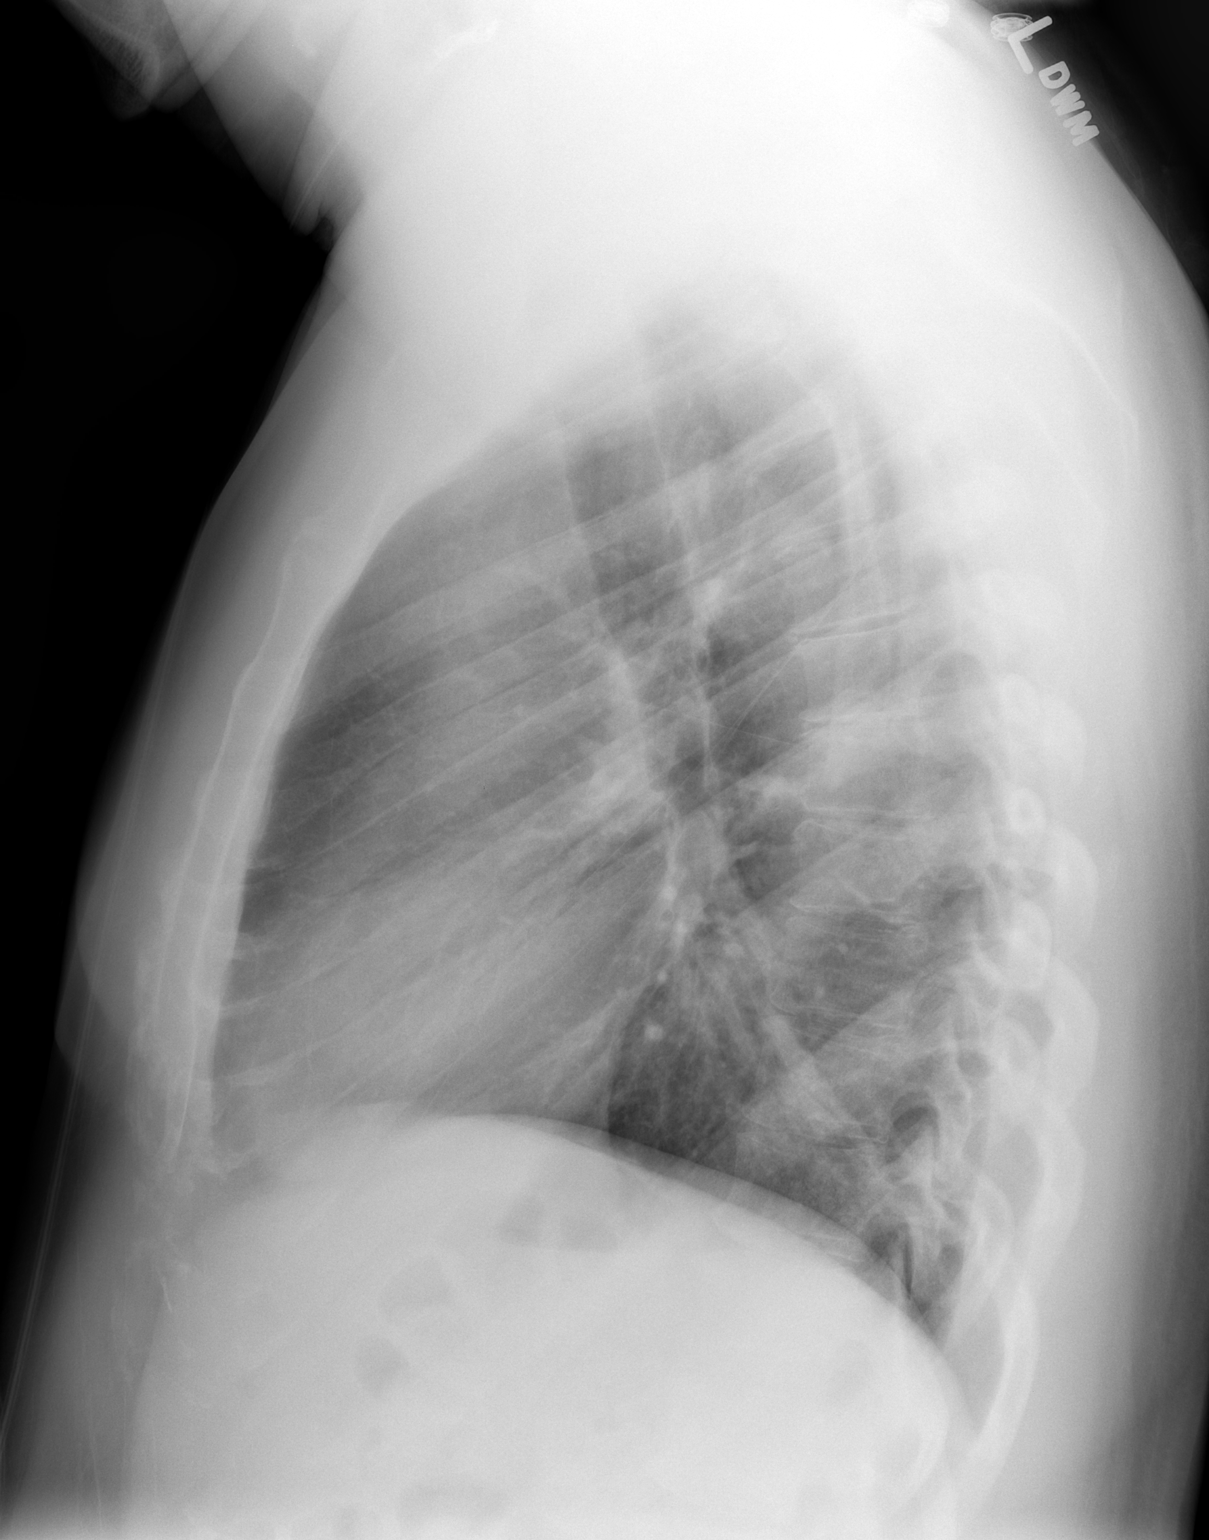

[2 of 2 positions shown; findings below may reference images not displayed]

FINDINGS: Cardiomediastinal silhouette is stable.  No acute
infiltrate or pleural effusion.  No pulmonary edema.  Bony thorax
is stable.
IMPRESSION: No active disease.

## 2012-03-04 ENCOUNTER — Ambulatory Visit: Payer: Medicaid Other | Admitting: Internal Medicine

## 2012-04-01 ENCOUNTER — Encounter (HOSPITAL_COMMUNITY): Payer: Self-pay

## 2012-04-01 ENCOUNTER — Emergency Department (HOSPITAL_COMMUNITY): Payer: Medicaid Other

## 2012-04-01 ENCOUNTER — Emergency Department (HOSPITAL_COMMUNITY)
Admission: EM | Admit: 2012-04-01 | Discharge: 2012-04-01 | Disposition: A | Discharge status: Home / Self Care | Payer: Medicaid Other | Attending: Emergency Medicine | Admitting: Emergency Medicine

## 2012-04-01 DIAGNOSIS — G8929 Other chronic pain: Secondary | ICD-10-CM | POA: Insufficient documentation

## 2012-04-01 DIAGNOSIS — R05 Cough: Secondary | ICD-10-CM | POA: Insufficient documentation

## 2012-04-01 DIAGNOSIS — Z87448 Personal history of other diseases of urinary system: Secondary | ICD-10-CM | POA: Insufficient documentation

## 2012-04-01 DIAGNOSIS — R0602 Shortness of breath: Secondary | ICD-10-CM | POA: Insufficient documentation

## 2012-04-01 DIAGNOSIS — Z8739 Personal history of other diseases of the musculoskeletal system and connective tissue: Secondary | ICD-10-CM | POA: Insufficient documentation

## 2012-04-01 DIAGNOSIS — IMO0001 Reserved for inherently not codable concepts without codable children: Secondary | ICD-10-CM | POA: Insufficient documentation

## 2012-04-01 DIAGNOSIS — D849 Immunodeficiency, unspecified: Secondary | ICD-10-CM

## 2012-04-01 DIAGNOSIS — R059 Cough, unspecified: Secondary | ICD-10-CM

## 2012-04-01 DIAGNOSIS — R5381 Other malaise: Secondary | ICD-10-CM | POA: Insufficient documentation

## 2012-04-01 DIAGNOSIS — R630 Anorexia: Secondary | ICD-10-CM | POA: Insufficient documentation

## 2012-04-01 DIAGNOSIS — J029 Acute pharyngitis, unspecified: Secondary | ICD-10-CM | POA: Insufficient documentation

## 2012-04-01 DIAGNOSIS — J40 Bronchitis, not specified as acute or chronic: Secondary | ICD-10-CM | POA: Insufficient documentation

## 2012-04-01 DIAGNOSIS — R51 Headache: Secondary | ICD-10-CM | POA: Insufficient documentation

## 2012-04-01 DIAGNOSIS — Z79899 Other long term (current) drug therapy: Secondary | ICD-10-CM | POA: Insufficient documentation

## 2012-04-01 DIAGNOSIS — B20 Human immunodeficiency virus [HIV] disease: Secondary | ICD-10-CM | POA: Insufficient documentation

## 2012-04-01 DIAGNOSIS — Z794 Long term (current) use of insulin: Secondary | ICD-10-CM | POA: Insufficient documentation

## 2012-04-01 DIAGNOSIS — R509 Fever, unspecified: Secondary | ICD-10-CM | POA: Insufficient documentation

## 2012-04-01 DIAGNOSIS — I10 Essential (primary) hypertension: Secondary | ICD-10-CM | POA: Insufficient documentation

## 2012-04-01 LAB — CBC WITH DIFFERENTIAL/PLATELET
Basophils Absolute: 0 10*3/uL (ref 0.0–0.1)
Basophils Relative: 0 % (ref 0–1)
Eosinophils Absolute: 0 10*3/uL (ref 0.0–0.7)
Eosinophils Relative: 1 % (ref 0–5)
HCT: 38 % — ABNORMAL LOW (ref 39.0–52.0)
Hemoglobin: 12.8 g/dL — ABNORMAL LOW (ref 13.0–17.0)
Lymphocytes Relative: 36 % (ref 12–46)
Lymphs Abs: 1.6 10*3/uL (ref 0.7–4.0)
MCH: 26.9 pg (ref 26.0–34.0)
MCHC: 33.7 g/dL (ref 30.0–36.0)
MCV: 79.8 fL (ref 78.0–100.0)
Monocytes Absolute: 0.4 10*3/uL (ref 0.1–1.0)
Monocytes Relative: 9 % (ref 3–12)
Neutro Abs: 2.4 10*3/uL (ref 1.7–7.7)
Neutrophils Relative %: 54 % (ref 43–77)
Platelets: 193 10*3/uL (ref 150–400)
RBC: 4.76 MIL/uL (ref 4.22–5.81)
RDW: 12.5 % (ref 11.5–15.5)
WBC: 4.5 10*3/uL (ref 4.0–10.5)

## 2012-04-01 LAB — BASIC METABOLIC PANEL
BUN: 12 mg/dL (ref 6–23)
CO2: 24 mEq/L (ref 19–32)
Calcium: 9.2 mg/dL (ref 8.4–10.5)
Chloride: 95 mEq/L — ABNORMAL LOW (ref 96–112)
Creatinine, Ser: 0.73 mg/dL (ref 0.50–1.35)
GFR calc Af Amer: >90 mL/min (ref >90)
GFR calc non Af Amer: >90 mL/min (ref >90)
Glucose, Bld: 274 mg/dL — ABNORMAL HIGH (ref 70–99)
Potassium: 3.9 mEq/L (ref 3.5–5.1)
Sodium: 130 mEq/L — ABNORMAL LOW (ref 135–145)

## 2012-04-01 MED ORDER — ALBUTEROL SULFATE HFA 108 (90 BASE) MCG/ACT IN AERS
2.0000 | INHALATION_SPRAY | Freq: Once | RESPIRATORY_TRACT | Status: AC
  Administered 2012-04-01: 2 via RESPIRATORY_TRACT
  Filled 2012-04-01: qty 6.7

## 2012-04-01 MED ORDER — AMOXICILLIN-POT CLAVULANATE 875-125 MG PO TABS: 1.0000 | ORAL_TABLET | Freq: Two times a day (BID) | ORAL | Status: DC

## 2012-04-01 MED ORDER — BENZONATATE 100 MG PO CAPS: 100.0000 mg | ORAL_CAPSULE | Freq: Three times a day (TID) | ORAL | Status: DC

## 2012-04-01 NOTE — ED Notes (Signed)
Pt presents with gradual onset of shortness of breath and cough.  Pt reports phlegm is dark yellow and thick. Pt has had symptoms x 6 days.  Pt reports chills, headache and nausea.  Pt has h/o HIV, has not taken medication since onset of symptoms.

## 2012-04-01 NOTE — ED Provider Notes (Signed)
History     CSN: [id number]  Arrival date & time 04/01/12  1423   First MD Initiated Contact with Patient 04/01/12 1502      Chief Complaint  Patient presents with  . Cough    (Consider location/radiation/quality/duration/timing/severity/associated sxs/prior treatment) Patient is a 46 y.o. male presenting with general illness. The history is provided by the patient. No language interpreter was used.  Illness  The current episode started 5 to 7 days ago. The problem occurs continuously. The problem has been unchanged. The problem is moderate. Nothing relieves the symptoms. Nothing aggravates the symptoms. Associated symptoms include a fever, headaches, sore throat and cough. Pertinent negatives include no abdominal pain, no constipation, no diarrhea, no nausea, no vomiting, no congestion and no rash.    Past Medical History  Diagnosis Date  . HIV (human immunodeficiency virus infection) 2009    CD4 count 100, VL 13800 (05/01/2010)  . Diabetes type 2, uncontrolled     HgA1c 17.6 (04/27/2010)  . Hypertension   . Genital warts   . Erectile dysfunction   . Chronic knee pain     right  . Osteomyelitis     h/o hand  . Diabetes mellitus   . Chronic pain   . AIDS     History reviewed. No pertinent past surgical history.  Family History  Problem Relation Age of Onset  . Hypertension Mother   . Arthritis Father   . Hypertension Father   . Hypertension Brother   . Cancer Maternal Grandmother 21    unknown type of cancer  . Depression Paternal Grandmother     History  Substance Use Topics  . Smoking status: Never Smoker   . Smokeless tobacco: Never Used  . Alcohol Use: No      Review of Systems  Constitutional: Positive for fever, chills, appetite change and fatigue.  HENT: Positive for sore throat. Negative for congestion.   Respiratory: Positive for cough and shortness of breath.   Cardiovascular: Negative for chest pain and leg swelling.  Gastrointestinal:  Negative for nausea, vomiting, abdominal pain, diarrhea and constipation.  Genitourinary: Negative for dysuria and frequency.  Skin: Negative for color change and rash.  Neurological: Positive for headaches. Negative for dizziness.  Psychiatric/Behavioral: Negative for confusion and agitation.  All other systems reviewed and are negative.    Allergies  Sulfa antibiotics  Home Medications   Current Outpatient Rx  Name  Route  Sig  Dispense  Refill  . ASPIRIN 81 MG PO TBEC   Oral   Take 1 tablet (81 mg total) by mouth daily.   30 tablet   5   . ATENOLOL 50 MG PO TABS   Oral   Take 1 tablet (50 mg total) by mouth daily.   30 tablet   5   . DAPSONE 100 MG PO TABS   Oral   Take 1 tablet (100 mg total) by mouth daily.   30 tablet   5   . DARUNAVIR ETHANOLATE 800 MG PO TABS   Oral   Take 1 tablet (800 mg total) by mouth daily with breakfast.   30 tablet   5   . EMTRICITABINE-TENOFOVIR 200-300 MG PO TABS   Oral   Take 1 tablet by mouth daily.   30 tablet   5   . GABAPENTIN 300 MG PO CAPS   Oral   Take 1 capsule (300 mg total) by mouth 3 (three) times daily.   180 capsule   4   .  GLIPIZIDE ER 5 MG PO TB24   Oral   Take 1 tablet (5 mg total) by mouth daily.   30 tablet   1   . INSULIN GLARGINE 100 UNIT/ML [name] SOLN   Subcutaneous   Inject 50 Units into the skin at bedtime.   10 mL   12     Diagnosis code is 250.00 for DM-II.   Marland Kitchen INSULIN SYRINGE-NEEDLE U-100 30G X 5/16" 1 ML MISC      diagnosis code is 250.00 for DM-II. Use it for insulin injection.   100 each   11   . LISINOPRIL 40 MG PO TABS   Oral   Take 1 tablet (40 mg total) by mouth daily.   30 tablet   5   . MELOXICAM 15 MG PO TABS   Oral   Take 15 mg by mouth daily.         Marland Kitchen METFORMIN HCL 500 MG PO TABS   Oral   Take 1 tablet (500 mg total) by mouth 2 (two) times daily with a meal.   120 tablet   4   . PRAVASTATIN SODIUM 20 MG PO TABS   Oral   Take 20 mg by mouth every evening.          Marland Kitchen RALTEGRAVIR POTASSIUM 400 MG PO TABS   Oral   Take 1 tablet (400 mg total) by mouth 2 (two) times daily.   60 tablet   5   . RITONAVIR 100 MG PO TABS   Oral   Take 1 tablet (100 mg total) by mouth daily with breakfast.   30 tablet   5   . TESTOSTERONE 50 MG/5GM TD GEL   Transdermal   Place 5 g onto the skin daily.            BP 101/83  Pulse 108  Temp 99.9 F (37.7 C) (Oral)  Resp 24  SpO2 97%  Physical Exam  Constitutional: He is oriented to person, place, and time. He appears well-developed and well-nourished. No distress.  HENT:  Head: Normocephalic and atraumatic.  Right Ear: Tympanic membrane normal.  Left Ear: Tympanic membrane normal.  Mouth/Throat: Uvula is midline, oropharynx is clear and moist and mucous membranes are normal.  Eyes: EOM are normal. Pupils are equal, round, and reactive to light.  Neck: Normal range of motion. Neck supple.  Cardiovascular: Normal rate and regular rhythm.   Pulmonary/Chest: Effort normal and breath sounds normal. Not tachypneic. No respiratory distress. He has no decreased breath sounds. He has no wheezes. He has no rales. He exhibits no tenderness.  Abdominal: Soft. He exhibits no distension.  Musculoskeletal: Normal range of motion. He exhibits no edema.  Neurological: He is alert and oriented to person, place, and time.  Skin: Skin is warm and dry.  Psychiatric: He has a normal mood and affect. His behavior is normal.    ED Course  Procedures (including critical care time)   Labs Reviewed  CBC WITH DIFFERENTIAL  BASIC METABOLIC PANEL   Results for orders placed during the hospital encounter of 04/01/12  CBC WITH DIFFERENTIAL      Component Value Range   WBC 4.5  4.0 - 10.5 K/uL   RBC 4.76  4.22 - 5.81 MIL/uL   Hemoglobin 12.8 (*) 13.0 - 17.0 g/dL   HCT 38.0 (*) 39.0 - 52.0 %   MCV 79.8  78.0 - 100.0 fL   MCH 26.9  26.0 - 34.0 pg   MCHC 33.7  30.0 - 36.0 g/dL   RDW 12.5  11.5 - 15.5 %   Platelets  193  150 - 400 K/uL   Neutrophils Relative 54  43 - 77 %   Neutro Abs 2.4  1.7 - 7.7 K/uL   Lymphocytes Relative 36  12 - 46 %   Lymphs Abs 1.6  0.7 - 4.0 K/uL   Monocytes Relative 9  3 - 12 %   Monocytes Absolute 0.4  0.1 - 1.0 K/uL   Eosinophils Relative 1  0 - 5 %   Eosinophils Absolute 0.0  0.0 - 0.7 K/uL   Basophils Relative 0  0 - 1 %   Basophils Absolute 0.0  0.0 - 0.1 K/uL  BASIC METABOLIC PANEL      Component Value Range   Sodium 130 (*) 135 - 145 mEq/L   Potassium 3.9  3.5 - 5.1 mEq/L   Chloride 95 (*) 96 - 112 mEq/L   CO2 24  19 - 32 mEq/L   Glucose, Bld 274 (*) 70 - 99 mg/dL   BUN 12  6 - 23 mg/dL   Creatinine, Ser 0.73  0.50 - 1.35 mg/dL   Calcium 9.2  8.4 - 10.5 mg/dL   GFR calc non Af Amer >90  >90 mL/min   GFR calc Af Amer >90  >90 mL/min    Dg Chest 2 View  04/01/2012  *RADIOLOGY REPORT*  Clinical Data: Cough, shortness of breath  CHEST - 2 VIEW  Comparison: 11/10/2011  Findings: Cardiomediastinal silhouette is stable.  No acute infiltrate or pleural effusion.  No pulmonary edema.  Bony thorax is stable.  IMPRESSION: No active disease.  No significant change.   Original Report Authenticated By: Lahoma Crocker, M.D.      No diagnosis found.    MDM  Pt w/ PMHx of DM2 and HIV, last CD4 count 30, admits to 6 day hx of productive cough - white/yellow sputum, subjective fever/chills/myalgia. Bilateral temporal aching headache, sore throat, decreased PO intake and retching. Has not taken his HIV meds since onset of illness.   Exam: afebrile, not hypoxic, no resp distress, non toxic, HEENT exam unremarkable, lungs CTAB,   Plan: concern for atypical pneumonia, PCP, viral influenza like illness, bronchitis. Will check CXR, CBC, BMP, will give albuterol for cough.  Reassessed: CXR - NACPF - no concern for PCP. Likely bronchitis, in light of high risk status - immunocompromised and DM. Will place on high dose amoxicillin and give cough Supressant. Labs unremarkable. D/c home  in good condition, given return precautions and follow up instructions.  1. Cough   2. Bronchitis   3. Immunocompromised    New Prescriptions   AMOXICILLIN-CLAVULANATE (AUGMENTIN) 875-125 MG PER TABLET    Take 1 tablet by mouth 2 (two) times daily. One po bid x 7 days   BENZONATATE (TESSALON) 100 MG CAPSULE    Take 1 capsule (100 mg total) by mouth every 8 (eight) hours.   Clinton Gallant, MD [address] [place] [address] [phone]  Schedule an appointment as soon as possible for a visit on 04/03/2012             Ernst Spell, MD 04/02/12 [phone]

## 2012-04-01 NOTE — ED Provider Notes (Signed)
46 year old male with HIV disease and history of noncompliance comes in with a cough and fever for the last 5 days. Cough is productive of yellowish to greenish sputum and there is associated chills, headache, nausea. Denies arthralgias or myalgias. On exam, lungs are clear and heart has regular rate and rhythm. Chest x-ray shows no infiltrates and images reviewed by me. His vital signs are normal. He will be given symptomatic treatment for cough and placed on antibiotics.  I saw and evaluated the patient, reviewed the resident's note and I agree with the findings and plan.   Delora Fuel, MD [phone]

## 2012-04-15 ENCOUNTER — Other Ambulatory Visit: Payer: Self-pay | Admitting: Internal Medicine

## 2012-04-16 ENCOUNTER — Encounter: Payer: Self-pay | Admitting: Internal Medicine

## 2012-04-29 ENCOUNTER — Other Ambulatory Visit: Payer: Medicaid Other

## 2012-04-30 ENCOUNTER — Encounter: Payer: Self-pay | Admitting: Dietician

## 2012-04-30 NOTE — Progress Notes (Signed)
[name], male   DOB: 12/08/1965, 47 y.o.   MRN: [name] Unable to reach patient by phone(number listed not working) or [employer])  to schedule appointment to see his PCP.

## 2012-05-13 ENCOUNTER — Telehealth: Payer: Self-pay | Admitting: *Deleted

## 2012-05-13 ENCOUNTER — Ambulatory Visit: Payer: Medicaid Other | Admitting: Infectious Diseases

## 2012-05-13 NOTE — Telephone Encounter (Signed)
Called patient to reschedule appt, he no showed today, no phone.  Called emergency contact and phone not in service. Myrtis Hopping

## 2012-05-30 ENCOUNTER — Emergency Department (HOSPITAL_COMMUNITY)
Admission: EM | Admit: 2012-05-30 | Discharge: 2012-05-31 | Disposition: A | Discharge status: Home / Self Care | Payer: Medicaid Other | Attending: Internal Medicine | Admitting: Internal Medicine

## 2012-05-30 ENCOUNTER — Encounter (HOSPITAL_COMMUNITY): Payer: Self-pay | Admitting: *Deleted

## 2012-05-30 DIAGNOSIS — Z794 Long term (current) use of insulin: Secondary | ICD-10-CM | POA: Insufficient documentation

## 2012-05-30 DIAGNOSIS — E1159 Type 2 diabetes mellitus with other circulatory complications: Secondary | ICD-10-CM | POA: Diagnosis present

## 2012-05-30 DIAGNOSIS — Z87448 Personal history of other diseases of urinary system: Secondary | ICD-10-CM | POA: Insufficient documentation

## 2012-05-30 DIAGNOSIS — E119 Type 2 diabetes mellitus without complications: Secondary | ICD-10-CM | POA: Insufficient documentation

## 2012-05-30 DIAGNOSIS — Z79899 Other long term (current) drug therapy: Secondary | ICD-10-CM | POA: Insufficient documentation

## 2012-05-30 DIAGNOSIS — R739 Hyperglycemia, unspecified: Secondary | ICD-10-CM

## 2012-05-30 DIAGNOSIS — Z7982 Long term (current) use of aspirin: Secondary | ICD-10-CM | POA: Insufficient documentation

## 2012-05-30 DIAGNOSIS — R7309 Other abnormal glucose: Secondary | ICD-10-CM | POA: Insufficient documentation

## 2012-05-30 DIAGNOSIS — G8929 Other chronic pain: Secondary | ICD-10-CM | POA: Diagnosis present

## 2012-05-30 DIAGNOSIS — M1711 Unilateral primary osteoarthritis, right knee: Secondary | ICD-10-CM | POA: Diagnosis present

## 2012-05-30 DIAGNOSIS — B20 Human immunodeficiency virus [HIV] disease: Secondary | ICD-10-CM | POA: Diagnosis present

## 2012-05-30 DIAGNOSIS — Z8739 Personal history of other diseases of the musculoskeletal system and connective tissue: Secondary | ICD-10-CM | POA: Insufficient documentation

## 2012-05-30 DIAGNOSIS — Z791 Long term (current) use of non-steroidal anti-inflammatories (NSAID): Secondary | ICD-10-CM | POA: Insufficient documentation

## 2012-05-30 DIAGNOSIS — R109 Unspecified abdominal pain: Secondary | ICD-10-CM | POA: Insufficient documentation

## 2012-05-30 DIAGNOSIS — M25569 Pain in unspecified knee: Secondary | ICD-10-CM | POA: Insufficient documentation

## 2012-05-30 DIAGNOSIS — I10 Essential (primary) hypertension: Secondary | ICD-10-CM | POA: Diagnosis present

## 2012-05-30 LAB — CBC WITH DIFFERENTIAL/PLATELET
Basophils Absolute: 0 10*3/uL (ref 0.0–0.1)
Basophils Relative: 0 % (ref 0–1)
Eosinophils Absolute: 0.1 10*3/uL (ref 0.0–0.7)
Eosinophils Relative: 2 % (ref 0–5)
HCT: 37.8 % — ABNORMAL LOW (ref 39.0–52.0)
Hemoglobin: 13.4 g/dL (ref 13.0–17.0)
Lymphocytes Relative: 25 % (ref 12–46)
Lymphs Abs: 1.1 10*3/uL (ref 0.7–4.0)
MCH: 27.7 pg (ref 26.0–34.0)
MCHC: 35.4 g/dL (ref 30.0–36.0)
MCV: 78.1 fL (ref 78.0–100.0)
Monocytes Absolute: 0.3 10*3/uL (ref 0.1–1.0)
Monocytes Relative: 7 % (ref 3–12)
Neutro Abs: 2.9 10*3/uL (ref 1.7–7.7)
Neutrophils Relative %: 67 % (ref 43–77)
Platelets: 192 10*3/uL (ref 150–400)
RBC: 4.84 MIL/uL (ref 4.22–5.81)
RDW: 13.2 % (ref 11.5–15.5)
WBC: 4.3 10*3/uL (ref 4.0–10.5)

## 2012-05-30 LAB — URINE MICROSCOPIC-ADD ON

## 2012-05-30 LAB — COMPREHENSIVE METABOLIC PANEL
ALT: 23 U/L (ref 0–53)
AST: 47 U/L — ABNORMAL HIGH (ref 0–37)
Albumin: 3.8 g/dL (ref 3.5–5.2)
Alkaline Phosphatase: 111 U/L (ref 39–117)
BUN: 13 mg/dL (ref 6–23)
CO2: 27 mEq/L (ref 19–32)
Calcium: 9.5 mg/dL (ref 8.4–10.5)
Chloride: 87 mEq/L — ABNORMAL LOW (ref 96–112)
Creatinine, Ser: 0.75 mg/dL (ref 0.50–1.35)
GFR calc Af Amer: >90 mL/min (ref >90)
GFR calc non Af Amer: >90 mL/min (ref >90)
Glucose, Bld: 648 mg/dL (ref 70–99)
Potassium: 4.2 mEq/L (ref 3.5–5.1)
Sodium: 124 mEq/L — ABNORMAL LOW (ref 135–145)
Total Bilirubin: 0.3 mg/dL (ref 0.3–1.2)
Total Protein: 8.5 g/dL — ABNORMAL HIGH (ref 6.0–8.3)

## 2012-05-30 LAB — GLUCOSE, CAPILLARY
Glucose-Capillary: 433 mg/dL — ABNORMAL HIGH (ref 70–99)
Glucose-Capillary: 445 mg/dL — ABNORMAL HIGH (ref 70–99)
Glucose-Capillary: 592 mg/dL (ref 70–99)

## 2012-05-30 LAB — URINALYSIS, ROUTINE W REFLEX MICROSCOPIC
Bilirubin Urine: NEGATIVE
Glucose, UA: >1000 mg/dL — AB
Hgb urine dipstick: NEGATIVE
Ketones, ur: NEGATIVE mg/dL
Leukocytes, UA: NEGATIVE
Nitrite: NEGATIVE
Protein, ur: NEGATIVE mg/dL
Specific Gravity, Urine: 1.038 — ABNORMAL HIGH (ref 1.005–1.030)
Urobilinogen, UA: 0.2 mg/dL (ref 0.0–1.0)
pH: 5 (ref 5.0–8.0)

## 2012-05-30 MED ORDER — DEXTROSE 50 % IV SOLN: 25.0000 mL | INTRAVENOUS | Status: DC | PRN

## 2012-05-30 MED ORDER — DEXTROSE-NACL 5-0.45 % IV SOLN: INTRAVENOUS | Status: DC

## 2012-05-30 MED ORDER — SODIUM CHLORIDE 0.9 % IV SOLN: INTRAVENOUS | Status: DC

## 2012-05-30 MED ORDER — SODIUM CHLORIDE 0.9 % IV SOLN
INTRAVENOUS | Status: DC
  Administered 2012-05-30: via INTRAVENOUS

## 2012-05-30 MED ORDER — SODIUM CHLORIDE 0.9 % IV SOLN
INTRAVENOUS | Status: DC
  Administered 2012-05-30: 3.9 [IU]/h via INTRAVENOUS
  Filled 2012-05-30: qty 1

## 2012-05-30 MED ORDER — INSULIN REGULAR BOLUS VIA INFUSION
0.0000 [IU] | Freq: Three times a day (TID) | INTRAVENOUS | Status: DC
  Filled 2012-05-30: qty 10

## 2012-05-30 NOTE — ED Notes (Signed)
IV team contacted regarding pt Venous access

## 2012-05-30 NOTE — Consult Note (Signed)
Consult Note Date: 05/30/2012  Patient name: Keith [name] Medical record number: [name] Date of birth: 06/19/1965 Age: 47 y.o. Gender: male PCP: Clinton Gallant, MD  Medical Service:  Attending physician: Dr. Linus Salmons      Chief Complaint: Hyperglycemia  History of Present Illness: 47 year old man with past medical history significant for HIV, Type 2 DM, medication non compliance presents to the ED for high blood sugars.  He says he takes Lantus insulin, and had taken 50 units this morning. He states that he was having some blurred vision and polyuria , which are his usual symptoms that he associates with high blood sugars. He also reports having chronic diarrhea but denies any abdominal pain, nausea, vomiting, fever, chills, cough, alteration in urinary habits. He has chronic right knee pain and says he takes Percocet for that.   He has been seen in the ER and clinic for hyperglycemia in the past.   Current Facility-Administered Medications  Medication Dose Route Frequency Provider Last Rate Last Dose  . 0.9 %  sodium chloride infusion   Intravenous Continuous Mylinda Latina III, MD 1,000 mL/hr at 05/30/12 2342    . 0.9 %  sodium chloride infusion   Intravenous Continuous Mylinda Latina III, MD      . dextrose 5 %-0.45 % sodium chloride infusion   Intravenous Continuous Mylinda Latina III, MD      . dextrose 50 % solution 25 mL  25 mL Intravenous PRN Mylinda Latina III, MD      . insulin regular (NOVOLIN R,HUMULIN R) 1 Units/mL in sodium chloride 0.9 % 100 mL infusion   Intravenous Continuous Mylinda Latina III, MD 3.9 mL/hr at 05/30/12 2342 3.9 Units/hr at 05/30/12 2342  . [START ON 05/31/2012] insulin regular bolus via infusion 0-10 Units  0-10 Units Intravenous TID WC Katy Apo, MD       Current Outpatient Prescriptions  Medication Sig Dispense Refill  . aspirin 81 MG EC tablet Take 1 tablet (81 mg total) by mouth daily.  30 tablet  5  . atenolol (TENORMIN) 50 MG tablet Take 1 tablet  (50 mg total) by mouth daily.  30 tablet  5  . benzonatate (TESSALON) 100 MG capsule Take 100 mg by mouth 3 (three) times daily as needed. For cough      . dapsone 100 MG tablet Take 1 tablet (100 mg total) by mouth daily.  30 tablet  5  . Darunavir Ethanolate (PREZISTA) 800 MG tablet Take 1 tablet (800 mg total) by mouth daily with breakfast.  30 tablet  5  . emtricitabine-tenofovir (TRUVADA) 200-300 MG per tablet Take 1 tablet by mouth daily.  30 tablet  5  . glipiZIDE (GLUCOTROL XL) 5 MG 24 hr tablet Take 1 tablet (5 mg total) by mouth daily.  30 tablet  1  . insulin glargine (LANTUS) 100 UNIT/ML injection Inject 50 Units into the skin at bedtime.      Marland Kitchen lisinopril (PRINIVIL,ZESTRIL) 40 MG tablet Take 1 tablet (40 mg total) by mouth daily.  30 tablet  5  . meloxicam (MOBIC) 15 MG tablet Take 15 mg by mouth daily.      . metFORMIN (GLUCOPHAGE) 500 MG tablet Take 500 mg by mouth 2 (two) times daily with a meal.      . pravastatin (PRAVACHOL) 20 MG tablet Take 20 mg by mouth every evening.      . raltegravir (ISENTRESS) 400 MG tablet Take 1 tablet (400 mg total) by mouth 2 (two) times  daily.  60 tablet  5  . ritonavir (NORVIR) 100 MG TABS Take 1 tablet (100 mg total) by mouth daily with breakfast.  30 tablet  5  . testosterone (ANDROGEL) 50 MG/5GM GEL Place 5 g onto the skin daily.       . B-D UF III MINI PEN NEEDLES 31G X 5 MM MISC USE TO INJECT LANTUS ONCE DAILY  100 each  0  . gabapentin (NEURONTIN) 300 MG capsule Take 1 capsule (300 mg total) by mouth 3 (three) times daily.  180 capsule  4  . Insulin Syringe-Needle U-100 30G X 5/16" 1 ML MISC diagnosis code is 250.00 for DM-II. Use it for insulin injection.  100 each  11    Allergies: Sulfa antibiotics  Past Medical History  Diagnosis Date  . HIV (human immunodeficiency virus infection) 2009    CD4 count 100, VL 13800 (05/01/2010)  . Diabetes type 2, uncontrolled     HgA1c 17.6 (04/27/2010)  . Hypertension   . Genital warts   .  Erectile dysfunction   . Chronic knee pain     right  . Osteomyelitis     h/o hand  . Diabetes mellitus   . Chronic pain   . AIDS     History reviewed. No pertinent past surgical history.  Family History  Problem Relation Age of Onset  . Hypertension Mother   . Arthritis Father   . Hypertension Father   . Hypertension Brother   . Cancer Maternal Grandmother 11    unknown type of cancer  . Depression Paternal Grandmother     History   Social History  . Marital Status: Single    Spouse Name: N/A    Number of Children: 52  . Years of Education: 12   Occupational History  . Not on file.   Social History Main Topics  . Smoking status: Never Smoker   . Smokeless tobacco: Never Used  . Alcohol Use: No  . Drug Use: No  . Sexually Active: Yes -- Male partner(s)     Comment: given condoms   Other Topics Concern  . Not on file   Social History Narrative   Worked for the city of [place] for 18 years.   Unemployed.    Applying for disability.   Medicaid patient.    Review of Systems: Pertinent items are noted in HPI.  Physical Exam:  Filed Vitals:   05/30/12 2000  BP: 134/90  Temp: 97.4 F (36.3 C)  TempSrc: Oral  Resp: 20  SpO2: 99%   Head: Normocephalic, without obvious abnormality, atraumatic Neck: no adenopathy, no carotid bruit, no JVD, supple, symmetrical, trachea midline and thyroid not enlarged, symmetric, no tenderness/mass/nodules Lungs: clear to auscultation bilaterally Heart: regular rate and rhythm, S1, S2 normal, no murmur, click, rub or gallop Abdomen: soft, non-tender; bowel sounds normal; no masses,  no organomegaly Extremities: extremities normal, atraumatic, no cyanosis or edema Pulses: 2+ and symmetric General appearance: alert, cooperative and appears stated age  Lab results: CBC:    Component Value Date/Time   WBC 4.3 05/30/2012 2018   HGB 13.4 05/30/2012 2018   HCT 37.8* 05/30/2012 2018   PLT 192 05/30/2012 2018   MCV 78.1  05/30/2012 2018   NEUTROABS 2.9 05/30/2012 2018   LYMPHSABS 1.1 05/30/2012 2018   MONOABS 0.3 05/30/2012 2018   EOSABS 0.1 05/30/2012 2018   BASOSABS 0.0 05/30/2012 2018     Comprehensive Metabolic Panel:    Component Value Date/Time   NA  124* 05/30/2012 2018   K 4.2 05/30/2012 2018   CL 87* 05/30/2012 2018   CO2 27 05/30/2012 2018   BUN 13 05/30/2012 2018   CREATININE 0.75 05/30/2012 2018   CREATININE 0.76 11/22/2011 1402   GLUCOSE 648* 05/30/2012 2018   CALCIUM 9.5 05/30/2012 2018   AST 47* 05/30/2012 2018   ALT 23 05/30/2012 2018   ALKPHOS 111 05/30/2012 2018   BILITOT 0.3 05/30/2012 2018   PROT 8.5* 05/30/2012 2018   ALBUMIN 3.8 05/30/2012 2018      Imaging results:  None    Assessment & Plan by Problem:  Hyperglycemia: Patient presents to the ED with CBG > 600's , likely in the setting of medication non compliance( though he claims that he took 50 units of lantus in AM. He has been seen in the ED and clinic for the similar problem in the past. He was hydrated in the ED and was transiently placed on glucostabilizer,. His CBG improved to 200's. His BMET did not show anion gap ( Anion gap -10 , K- 4.2)  . No evidence of diabetic ketoacidosis. UA and CXR  are clear. His case was discussed with Dr. Monia Pouch, who agreed with [place] , discharging the patient home once his CBG's would be close to 200.  Pseudohyponatremia: 2/2 hyperglycemia. Check BMET with clinic appointment.  Chronic right knee pain:  Patient has been getting his percocet's from some other health care provider. ( Reviewed narcotic database).   Dispo: D/C home with [facility] follow up next week. His cell phone number is [phone].

## 2012-05-30 NOTE — ED Provider Notes (Signed)
History     CSN: [id number]  Arrival date & time 05/30/12  1954   First MD Initiated Contact with Patient 05/30/12 2150      Chief Complaint  Patient presents with  . Hyperglycemia  . Knee Pain    (Consider location/radiation/quality/duration/timing/severity/associated sxs/prior treatment) HPI Comments: Patient is a 47 year old man who has multiple chronic problems. Today he is here because his blood sugar is quite high. He says he takes Lantus insulin, and had taken 50 units this morning. He has HIV and is on medication for that. He has chronic right knee pain and says he takes Percocet for that. He has a history of noncompliance. He is seen in the [facility].  Patient is a 47 y.o. male presenting with general illness.  Illness  The current episode started today. The onset was sudden. Episode frequency: Blood sugar found to be over 600. The problem has been unchanged. The problem is severe. Nothing relieves the symptoms. Nothing aggravates the symptoms. Associated symptoms include abdominal pain. Pertinent negatives include no fever and no cough. Associated symptoms comments: Chronic right knee pain.Marland Kitchen He has been eating and drinking normally. He has received no recent medical care.    Past Medical History  Diagnosis Date  . HIV (human immunodeficiency virus infection) 2009    CD4 count 100, VL 13800 (05/01/2010)  . Diabetes type 2, uncontrolled     HgA1c 17.6 (04/27/2010)  . Hypertension   . Genital warts   . Erectile dysfunction   . Chronic knee pain     right  . Osteomyelitis     h/o hand  . Diabetes mellitus   . Chronic pain   . AIDS     History reviewed. No pertinent past surgical history.  Family History  Problem Relation Age of Onset  . Hypertension Mother   . Arthritis Father   . Hypertension Father   . Hypertension Brother   . Cancer Maternal Grandmother 64    unknown type of cancer  . Depression Paternal Grandmother      History  Substance Use Topics  . Smoking status: Never Smoker   . Smokeless tobacco: Never Used  . Alcohol Use: No      Review of Systems  Constitutional: Negative for fever and chills.  HENT: Negative.   Eyes: Positive for visual disturbance.  Respiratory: Negative for cough.   Cardiovascular: Negative.  Negative for chest pain and palpitations.  Gastrointestinal: Positive for abdominal pain.  Endocrine:       Insulin-dependent diabetic, with a history of noncompliance.  Genitourinary: Negative.   Musculoskeletal:       Right knee pain. No recent injury.  Skin: Negative.   Neurological: Negative.   Psychiatric/Behavioral: Negative.     Allergies  Sulfa antibiotics  Home Medications   Current Outpatient Rx  Name  Route  Sig  Dispense  Refill  . amoxicillin-clavulanate (AUGMENTIN) 875-125 MG per tablet   Oral   Take 1 tablet by mouth 2 (two) times daily. One po bid x 7 days   14 tablet   0   . aspirin 81 MG EC tablet   Oral   Take 1 tablet (81 mg total) by mouth daily.   30 tablet   5   . atenolol (TENORMIN) 50 MG tablet   Oral   Take 1 tablet (50 mg total) by mouth daily.   30 tablet   5   . B-D UF III MINI PEN NEEDLES 31G X  5 MM MISC      USE TO INJECT LANTUS ONCE DAILY   100 each   0   . benzonatate (TESSALON) 100 MG capsule   Oral   Take 1 capsule (100 mg total) by mouth every 8 (eight) hours.   21 capsule   0   . dapsone 100 MG tablet   Oral   Take 1 tablet (100 mg total) by mouth daily.   30 tablet   5   . Darunavir Ethanolate (PREZISTA) 800 MG tablet   Oral   Take 1 tablet (800 mg total) by mouth daily with breakfast.   30 tablet   5   . emtricitabine-tenofovir (TRUVADA) 200-300 MG per tablet   Oral   Take 1 tablet by mouth daily.   30 tablet   5   . EXPIRED: gabapentin (NEURONTIN) 300 MG capsule   Oral   Take 1 capsule (300 mg total) by mouth 3 (three) times daily.   180 capsule   4   . glipiZIDE (GLUCOTROL XL) 5 MG  24 hr tablet   Oral   Take 1 tablet (5 mg total) by mouth daily.   30 tablet   1   . insulin glargine (LANTUS SOLOSTAR) 100 UNIT/ML injection   Subcutaneous   Inject 50 Units into the skin at bedtime.   10 mL   12     Diagnosis code is 250.00 for DM-II.   Marland Kitchen Insulin Syringe-Needle U-100 30G X 5/16" 1 ML MISC      diagnosis code is 250.00 for DM-II. Use it for insulin injection.   100 each   11   . lisinopril (PRINIVIL,ZESTRIL) 40 MG tablet   Oral   Take 1 tablet (40 mg total) by mouth daily.   30 tablet   5   . meloxicam (MOBIC) 15 MG tablet   Oral   Take 15 mg by mouth daily.         Marland Kitchen EXPIRED: metFORMIN (GLUCOPHAGE) 500 MG tablet   Oral   Take 1 tablet (500 mg total) by mouth 2 (two) times daily with a meal.   120 tablet   4   . EXPIRED: pravastatin (PRAVACHOL) 20 MG tablet   Oral   Take 20 mg by mouth every evening.         . raltegravir (ISENTRESS) 400 MG tablet   Oral   Take 1 tablet (400 mg total) by mouth 2 (two) times daily.   60 tablet   5   . ritonavir (NORVIR) 100 MG TABS   Oral   Take 1 tablet (100 mg total) by mouth daily with breakfast.   30 tablet   5   . testosterone (ANDROGEL) 50 MG/5GM GEL   Transdermal   Place 5 g onto the skin daily.            BP 134/90  Temp(Src) 97.4 F (36.3 C) (Oral)  Resp 20  SpO2 99%  Physical Exam  Nursing note and vitals reviewed. Constitutional: He is oriented to person, place, and time. He appears well-developed and well-nourished.  Complaining of pain in the right knee.  HENT:  Head: Normocephalic and atraumatic.  Right Ear: External ear normal.  Left Ear: External ear normal.  Mouth/Throat: Oropharynx is clear and moist.  Eyes: Conjunctivae and EOM are normal. Pupils are equal, round, and reactive to light.  Neck: Normal range of motion. Neck supple.  Cardiovascular: Normal rate, regular rhythm and normal heart sounds.   Pulmonary/Chest:  Effort normal and breath sounds normal.   Abdominal: Soft. Bowel sounds are normal.  Musculoskeletal:  He localizes pain to the right knee. He has some pain on range of motion. There is tenderness to palpation.  Neurological: He is alert and oriented to person, place, and time.  No sensory or motor deficit.  Skin: Skin is warm and dry.  Psychiatric: He has a normal mood and affect. His behavior is normal.    ED Course  Procedures (including critical care time)  Labs Reviewed  GLUCOSE, CAPILLARY - Abnormal; Notable for the following:    Glucose-Capillary 592 (*)    All other components within normal limits  COMPREHENSIVE METABOLIC PANEL - Abnormal; Notable for the following:    Sodium 124 (*)    Chloride 87 (*)    Glucose, Bld 648 (*)    Total Protein 8.5 (*)    AST 47 (*)    All other components within normal limits  CBC WITH DIFFERENTIAL - Abnormal; Notable for the following:    HCT 37.8 (*)    All other components within normal limits  URINALYSIS, ROUTINE W REFLEX MICROSCOPIC   10:14 PM Results for orders placed during the hospital encounter of 05/30/12  GLUCOSE, CAPILLARY      Result Value Range   Glucose-Capillary 592 (*) 70 - 99 mg/dL   Comment 1 Documented in Chart     Comment 2 Notify RN    COMPREHENSIVE METABOLIC PANEL      Result Value Range   Sodium 124 (*) 135 - 145 mEq/L   Potassium 4.2  3.5 - 5.1 mEq/L   Chloride 87 (*) 96 - 112 mEq/L   CO2 27  19 - 32 mEq/L   Glucose, Bld 648 (*) 70 - 99 mg/dL   BUN 13  6 - 23 mg/dL   Creatinine, Ser 0.75  0.50 - 1.35 mg/dL   Calcium 9.5  8.4 - 10.5 mg/dL   Total Protein 8.5 (*) 6.0 - 8.3 g/dL   Albumin 3.8  3.5 - 5.2 g/dL   AST 47 (*) 0 - 37 U/L   ALT 23  0 - 53 U/L   Alkaline Phosphatase 111  39 - 117 U/L   Total Bilirubin 0.3  0.3 - 1.2 mg/dL   GFR calc non Af Amer >90  >90 mL/min   GFR calc Af Amer >90  >90 mL/min  CBC WITH DIFFERENTIAL      Result Value Range   WBC 4.3  4.0 - 10.5 K/uL   RBC 4.84  4.22 - 5.81 MIL/uL   Hemoglobin 13.4  13.0 - 17.0  g/dL   HCT 37.8 (*) 39.0 - 52.0 %   MCV 78.1  78.0 - 100.0 fL   MCH 27.7  26.0 - 34.0 pg   MCHC 35.4  30.0 - 36.0 g/dL   RDW 13.2  11.5 - 15.5 %   Platelets 192  150 - 400 K/uL   Neutrophils Relative 67  43 - 77 %   Neutro Abs 2.9  1.7 - 7.7 K/uL   Lymphocytes Relative 25  12 - 46 %   Lymphs Abs 1.1  0.7 - 4.0 K/uL   Monocytes Relative 7  3 - 12 %   Monocytes Absolute 0.3  0.1 - 1.0 K/uL   Eosinophils Relative 2  0 - 5 %   Eosinophils Absolute 0.1  0.0 - 0.7 K/uL   Basophils Relative 0  0 - 1 %   Basophils Absolute 0.0  0.0 - 0.1 K/uL   10:14 PM Pt has hyperglycemia of 648, not in diabetic ketoacidosis.  He also has chronic right knee pain.  He will need IV fluids and the glucose stabilizer to get his blood sugar back under control.    12:12 AM Pt seen by Dr. Leonia Reeves of [facility].  She requested that we rehydrate pt and give him IV insulin, with the plan to release him when his blood glucose was about 200.    1. Hyperglycemia   2. Chronic pain           Mylinda Latina III, MD 05/31/12 1248

## 2012-05-30 NOTE — ED Notes (Addendum)
C/o R knee pain, acute & chronic, also here for high blood sugar. H/o same for both problems. Pain onset Wednesday. Denies new injury to knee. (denies: nv, fever), admits to dizziness & blurry vision. Also admits to diarrhea (usual for pt). Pt using phone and watching TV during triage. "Needs note for work".

## 2012-05-31 LAB — GLUCOSE, CAPILLARY
Glucose-Capillary: 202 mg/dL — ABNORMAL HIGH (ref 70–99)
Glucose-Capillary: 318 mg/dL — ABNORMAL HIGH (ref 70–99)
Glucose-Capillary: 328 mg/dL — ABNORMAL HIGH (ref 70–99)
Glucose-Capillary: 422 mg/dL — ABNORMAL HIGH (ref 70–99)

## 2012-05-31 NOTE — ED Notes (Signed)
CBG completed @ 1150

## 2012-06-01 NOTE — Consult Note (Signed)
Case reviewed with Dr. Leonia Reeves.  Has hyperglycemia but no acidosis.  To get close IM follow up.  No indication for acute hospital admission.

## 2012-06-09 ENCOUNTER — Ambulatory Visit: Payer: Medicaid Other | Admitting: Internal Medicine

## 2012-06-11 ENCOUNTER — Emergency Department (HOSPITAL_COMMUNITY)
Admission: EM | Admit: 2012-06-11 | Discharge: 2012-06-12 | Disposition: A | Discharge status: Home / Self Care | Payer: No Typology Code available for payment source | Attending: Emergency Medicine | Admitting: Emergency Medicine

## 2012-06-11 ENCOUNTER — Encounter (HOSPITAL_COMMUNITY): Payer: Self-pay | Admitting: *Deleted

## 2012-06-11 DIAGNOSIS — Z21 Asymptomatic human immunodeficiency virus [HIV] infection status: Secondary | ICD-10-CM | POA: Insufficient documentation

## 2012-06-11 DIAGNOSIS — Z8739 Personal history of other diseases of the musculoskeletal system and connective tissue: Secondary | ICD-10-CM | POA: Insufficient documentation

## 2012-06-11 DIAGNOSIS — R739 Hyperglycemia, unspecified: Secondary | ICD-10-CM

## 2012-06-11 DIAGNOSIS — Z794 Long term (current) use of insulin: Secondary | ICD-10-CM | POA: Insufficient documentation

## 2012-06-11 DIAGNOSIS — I10 Essential (primary) hypertension: Secondary | ICD-10-CM | POA: Insufficient documentation

## 2012-06-11 DIAGNOSIS — M549 Dorsalgia, unspecified: Secondary | ICD-10-CM

## 2012-06-11 DIAGNOSIS — Z7982 Long term (current) use of aspirin: Secondary | ICD-10-CM | POA: Insufficient documentation

## 2012-06-11 DIAGNOSIS — E1169 Type 2 diabetes mellitus with other specified complication: Secondary | ICD-10-CM | POA: Insufficient documentation

## 2012-06-11 DIAGNOSIS — S139XXA Sprain of joints and ligaments of unspecified parts of neck, initial encounter: Secondary | ICD-10-CM

## 2012-06-11 DIAGNOSIS — S0003XA Contusion of scalp, initial encounter: Secondary | ICD-10-CM

## 2012-06-11 DIAGNOSIS — Y92410 Unspecified street and highway as the place of occurrence of the external cause: Secondary | ICD-10-CM | POA: Insufficient documentation

## 2012-06-11 DIAGNOSIS — Y9389 Activity, other specified: Secondary | ICD-10-CM | POA: Insufficient documentation

## 2012-06-11 DIAGNOSIS — Z872 Personal history of diseases of the skin and subcutaneous tissue: Secondary | ICD-10-CM | POA: Insufficient documentation

## 2012-06-11 DIAGNOSIS — Z79899 Other long term (current) drug therapy: Secondary | ICD-10-CM | POA: Insufficient documentation

## 2012-06-11 DIAGNOSIS — M25569 Pain in unspecified knee: Secondary | ICD-10-CM | POA: Insufficient documentation

## 2012-06-11 DIAGNOSIS — G8929 Other chronic pain: Secondary | ICD-10-CM | POA: Insufficient documentation

## 2012-06-11 DIAGNOSIS — S4980XA Other specified injuries of shoulder and upper arm, unspecified arm, initial encounter: Secondary | ICD-10-CM | POA: Insufficient documentation

## 2012-06-11 DIAGNOSIS — S46909A Unspecified injury of unspecified muscle, fascia and tendon at shoulder and upper arm level, unspecified arm, initial encounter: Secondary | ICD-10-CM | POA: Insufficient documentation
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
## 2012-06-12 ENCOUNTER — Emergency Department (HOSPITAL_COMMUNITY): Payer: No Typology Code available for payment source

## 2012-06-12 LAB — BASIC METABOLIC PANEL
BUN: 13 mg/dL (ref 6–23)
CO2: 27 mEq/L (ref 19–32)
Calcium: 9.4 mg/dL (ref 8.4–10.5)
Chloride: 96 mEq/L (ref 96–112)
Creatinine, Ser: 0.75 mg/dL (ref 0.50–1.35)
GFR calc Af Amer: >90 mL/min (ref >90)
GFR calc non Af Amer: >90 mL/min (ref >90)
Glucose, Bld: 463 mg/dL — ABNORMAL HIGH (ref 70–99)
Potassium: 4.4 mEq/L (ref 3.5–5.1)
Sodium: 132 mEq/L — ABNORMAL LOW (ref 135–145)

## 2012-06-12 LAB — GLUCOSE, CAPILLARY: Glucose-Capillary: 354 mg/dL — ABNORMAL HIGH (ref 70–99)

## 2012-06-12 MED ORDER — HYDROCODONE-ACETAMINOPHEN 5-325 MG PO TABS: 1.0000 | ORAL_TABLET | Freq: Four times a day (QID) | ORAL | Status: DC | PRN

## 2012-06-12 MED ORDER — INSULIN GLARGINE 100 UNIT/ML ~~LOC~~ SOLN
50.0000 [IU] | Freq: Once | SUBCUTANEOUS | Status: AC
  Administered 2012-06-12: 50 [IU] via SUBCUTANEOUS
  Filled 2012-06-12: qty 1

## 2012-06-12 MED ORDER — IBUPROFEN 600 MG PO TABS: 600.0000 mg | ORAL_TABLET | Freq: Four times a day (QID) | ORAL | Status: DC | PRN

## 2012-06-12 MED ORDER — DIAZEPAM 5 MG PO TABS
5.0000 mg | ORAL_TABLET | Freq: Once | ORAL | Status: AC
  Administered 2012-06-12: 5 mg via ORAL
  Filled 2012-06-12: qty 1

## 2012-06-12 MED ORDER — OXYCODONE-ACETAMINOPHEN 5-325 MG PO TABS
2.0000 | ORAL_TABLET | Freq: Once | ORAL | Status: AC
  Administered 2012-06-12: 2 via ORAL
  Filled 2012-06-12: qty 2
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
## 2012-06-14 ENCOUNTER — Emergency Department (HOSPITAL_COMMUNITY)
Admission: EM | Admit: 2012-06-14 | Discharge: 2012-06-14 | Disposition: A | Discharge status: Home / Self Care | Payer: No Typology Code available for payment source | Attending: Emergency Medicine | Admitting: Emergency Medicine

## 2012-06-14 ENCOUNTER — Encounter (HOSPITAL_COMMUNITY): Payer: Self-pay | Admitting: Emergency Medicine

## 2012-06-14 DIAGNOSIS — E119 Type 2 diabetes mellitus without complications: Secondary | ICD-10-CM | POA: Insufficient documentation

## 2012-06-14 DIAGNOSIS — Z8739 Personal history of other diseases of the musculoskeletal system and connective tissue: Secondary | ICD-10-CM | POA: Insufficient documentation

## 2012-06-14 DIAGNOSIS — G8911 Acute pain due to trauma: Secondary | ICD-10-CM | POA: Insufficient documentation

## 2012-06-14 DIAGNOSIS — Z79899 Other long term (current) drug therapy: Secondary | ICD-10-CM | POA: Insufficient documentation

## 2012-06-14 DIAGNOSIS — Z7982 Long term (current) use of aspirin: Secondary | ICD-10-CM | POA: Insufficient documentation

## 2012-06-14 DIAGNOSIS — S134XXD Sprain of ligaments of cervical spine, subsequent encounter: Secondary | ICD-10-CM

## 2012-06-14 DIAGNOSIS — M542 Cervicalgia: Secondary | ICD-10-CM | POA: Insufficient documentation

## 2012-06-14 DIAGNOSIS — M25569 Pain in unspecified knee: Secondary | ICD-10-CM | POA: Insufficient documentation

## 2012-06-14 DIAGNOSIS — Z87448 Personal history of other diseases of urinary system: Secondary | ICD-10-CM | POA: Insufficient documentation

## 2012-06-14 DIAGNOSIS — B20 Human immunodeficiency virus [HIV] disease: Secondary | ICD-10-CM | POA: Insufficient documentation

## 2012-06-14 DIAGNOSIS — G8929 Other chronic pain: Secondary | ICD-10-CM | POA: Insufficient documentation

## 2012-06-14 DIAGNOSIS — I10 Essential (primary) hypertension: Secondary | ICD-10-CM | POA: Insufficient documentation

## 2012-06-14 MED ORDER — HYDROCODONE-ACETAMINOPHEN 5-325 MG PO TABS: 1.0000 | ORAL_TABLET | Freq: Four times a day (QID) | ORAL | Status: DC | PRN

## 2012-06-14 MED ORDER — IBUPROFEN 800 MG PO TABS: 800.0000 mg | ORAL_TABLET | Freq: Three times a day (TID) | ORAL | Status: DC

## 2012-06-14 MED ORDER — OXYCODONE-ACETAMINOPHEN 5-325 MG PO TABS: 1.0000 | ORAL_TABLET | Freq: Once | ORAL | Status: DC

## 2012-06-14 MED ORDER — IBUPROFEN 400 MG PO TABS
800.0000 mg | ORAL_TABLET | Freq: Once | ORAL | Status: AC
  Administered 2012-06-14: 800 mg via ORAL
  Filled 2012-06-14: qty 2
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
## 2012-06-19 NOTE — ED Provider Notes (Signed)
Medical screening examination/treatment/procedure(s) were performed by non-physician practitioner and as supervising physician I was immediately available for consultation/collaboration.   Blanchie Dessert, MD 06/19/12 [phone]

## 2012-07-19 ENCOUNTER — Emergency Department (HOSPITAL_COMMUNITY)
Admission: EM | Admit: 2012-07-19 | Discharge: 2012-07-19 | Disposition: A | Discharge status: Home / Self Care | Payer: Medicaid Other | Attending: Emergency Medicine | Admitting: Emergency Medicine

## 2012-07-19 ENCOUNTER — Encounter (HOSPITAL_COMMUNITY): Payer: Self-pay | Admitting: Physical Medicine and Rehabilitation

## 2012-07-19 ENCOUNTER — Emergency Department (HOSPITAL_COMMUNITY): Payer: Medicaid Other

## 2012-07-19 DIAGNOSIS — Z794 Long term (current) use of insulin: Secondary | ICD-10-CM | POA: Insufficient documentation

## 2012-07-19 DIAGNOSIS — E1169 Type 2 diabetes mellitus with other specified complication: Secondary | ICD-10-CM | POA: Insufficient documentation

## 2012-07-19 DIAGNOSIS — IMO0002 Reserved for concepts with insufficient information to code with codable children: Secondary | ICD-10-CM | POA: Insufficient documentation

## 2012-07-19 DIAGNOSIS — I10 Essential (primary) hypertension: Secondary | ICD-10-CM | POA: Insufficient documentation

## 2012-07-19 DIAGNOSIS — Z7982 Long term (current) use of aspirin: Secondary | ICD-10-CM | POA: Insufficient documentation

## 2012-07-19 DIAGNOSIS — B20 Human immunodeficiency virus [HIV] disease: Secondary | ICD-10-CM | POA: Insufficient documentation

## 2012-07-19 DIAGNOSIS — Z79899 Other long term (current) drug therapy: Secondary | ICD-10-CM | POA: Insufficient documentation

## 2012-07-19 DIAGNOSIS — E1165 Type 2 diabetes mellitus with hyperglycemia: Secondary | ICD-10-CM | POA: Insufficient documentation

## 2012-07-19 DIAGNOSIS — G8929 Other chronic pain: Secondary | ICD-10-CM | POA: Insufficient documentation

## 2012-07-19 DIAGNOSIS — Z8739 Personal history of other diseases of the musculoskeletal system and connective tissue: Secondary | ICD-10-CM | POA: Insufficient documentation

## 2012-07-19 DIAGNOSIS — Z8619 Personal history of other infectious and parasitic diseases: Secondary | ICD-10-CM | POA: Insufficient documentation

## 2012-07-19 DIAGNOSIS — R739 Hyperglycemia, unspecified: Secondary | ICD-10-CM

## 2012-07-19 DIAGNOSIS — Z87448 Personal history of other diseases of urinary system: Secondary | ICD-10-CM | POA: Insufficient documentation

## 2012-07-19 LAB — COMPREHENSIVE METABOLIC PANEL
ALT: 13 U/L (ref 0–53)
AST: 16 U/L (ref 0–37)
Albumin: 3.8 g/dL (ref 3.5–5.2)
Alkaline Phosphatase: 99 U/L (ref 39–117)
BUN: 13 mg/dL (ref 6–23)
CO2: 26 mEq/L (ref 19–32)
Calcium: 9.6 mg/dL (ref 8.4–10.5)
Chloride: 88 mEq/L — ABNORMAL LOW (ref 96–112)
Creatinine, Ser: 0.68 mg/dL (ref 0.50–1.35)
GFR calc Af Amer: >90 mL/min (ref >90)
GFR calc non Af Amer: >90 mL/min (ref >90)
Glucose, Bld: 665 mg/dL (ref 70–99)
Potassium: 4.2 mEq/L (ref 3.5–5.1)
Sodium: 124 mEq/L — ABNORMAL LOW (ref 135–145)
Total Bilirubin: 0.4 mg/dL (ref 0.3–1.2)
Total Protein: 8.2 g/dL (ref 6.0–8.3)

## 2012-07-19 LAB — URINALYSIS, ROUTINE W REFLEX MICROSCOPIC
Bilirubin Urine: NEGATIVE
Glucose, UA: >1000 mg/dL — AB
Hgb urine dipstick: NEGATIVE
Ketones, ur: NEGATIVE mg/dL
Leukocytes, UA: NEGATIVE
Nitrite: NEGATIVE
Protein, ur: NEGATIVE mg/dL
Specific Gravity, Urine: 1.031 — ABNORMAL HIGH (ref 1.005–1.030)
Urobilinogen, UA: 1 mg/dL (ref 0.0–1.0)
pH: 6.5 (ref 5.0–8.0)

## 2012-07-19 LAB — CBC WITH DIFFERENTIAL/PLATELET
Basophils Absolute: 0 10*3/uL (ref 0.0–0.1)
Basophils Relative: 0 % (ref 0–1)
Eosinophils Absolute: 0.1 10*3/uL (ref 0.0–0.7)
Eosinophils Relative: 2 % (ref 0–5)
HCT: 38.3 % — ABNORMAL LOW (ref 39.0–52.0)
Hemoglobin: 13.3 g/dL (ref 13.0–17.0)
Lymphocytes Relative: 31 % (ref 12–46)
Lymphs Abs: 1.7 10*3/uL (ref 0.7–4.0)
MCH: 27 pg (ref 26.0–34.0)
MCHC: 34.7 g/dL (ref 30.0–36.0)
MCV: 77.8 fL — ABNORMAL LOW (ref 78.0–100.0)
Monocytes Absolute: 0.4 10*3/uL (ref 0.1–1.0)
Monocytes Relative: 6 % (ref 3–12)
Neutro Abs: 3.3 10*3/uL (ref 1.7–7.7)
Neutrophils Relative %: 61 % (ref 43–77)
Platelets: 157 10*3/uL (ref 150–400)
RBC: 4.92 MIL/uL (ref 4.22–5.81)
RDW: 13 % (ref 11.5–15.5)
WBC: 5.5 10*3/uL (ref 4.0–10.5)

## 2012-07-19 LAB — GLUCOSE, CAPILLARY: Glucose-Capillary: 582 mg/dL (ref 70–99)

## 2012-07-19 LAB — URINE MICROSCOPIC-ADD ON

## 2012-07-19 MED ORDER — SODIUM CHLORIDE 0.9 % IV BOLUS (SEPSIS)
1000.0000 mL | Freq: Once | INTRAVENOUS | Status: AC
  Administered 2012-07-19: 1000 mL via INTRAVENOUS

## 2012-07-19 MED ORDER — INSULIN ASPART 100 UNIT/ML ~~LOC~~ SOLN
10.0000 [IU] | Freq: Once | SUBCUTANEOUS | Status: AC
  Administered 2012-07-19: 10 [IU] via INTRAVENOUS
  Filled 2012-07-19: qty 1

## 2012-07-19 MED ORDER — INSULIN ASPART 100 UNIT/ML ~~LOC~~ SOLN
10.0000 [IU] | Freq: Once | SUBCUTANEOUS | Status: AC
  Administered 2012-07-19: 10 [IU] via SUBCUTANEOUS
  Filled 2012-07-19: qty 1

## 2012-07-19 NOTE — ED Notes (Signed)
CBG was 348

## 2012-07-19 NOTE — ED Notes (Addendum)
Pt here for hyperglycemia, however when pt picked up from the waiting room to come back pt was finishing up a bag of Fritos and a Ginger Ale.

## 2012-07-19 NOTE — ED Notes (Signed)
cbg reads 582

## 2012-07-19 NOTE — ED Notes (Signed)
CBG 402 @ 1402

## 2012-07-19 NOTE — ED Notes (Signed)
Pt presents to department for evaluation of hyperglycemia. States he feels tired and weak. Also states nausea/vomiting. Pt is conscious alert and oriented x4. Ambulatory to triage.

## 2012-07-19 NOTE — ED Provider Notes (Signed)
History     CSN: [id number]  Arrival date & time 07/19/12  [id number]   First MD Initiated Contact with Patient 07/19/12 1021      Chief Complaint  Patient presents with  . Hyperglycemia    (Consider location/radiation/quality/duration/timing/severity/associated sxs/prior treatment) HPI Comments: Patient is a 47 year old male with a history of hypertension and diabetes presents for hyperglycemia. Patient states he has been feeling fatigued since Tuesday with polyuria and polydipsia. Patient states symptoms have been worsening since onset. He denies any aggravating or alleviating factors the symptoms. He states that he takes 50 units of Lantus at bedtime, glipizide daily, as well as metformin twice a day. Patient saw his doctor on Friday who refilled his metformin; patient states he has been unable to pick it up at the pharmacy yet. Patient endorses associated intermittent blurry vision, nausea, and 1 episode of nonbloody, nonbilious emesis yesterday. He denies lightheadedness, dizziness, chest pain, SOB, abdominal pain, and numbness or tingling in his extremities. Patient has hx of noncompliance.  The history is provided by the patient. No language interpreter was used.    Past Medical History  Diagnosis Date  . HIV (human immunodeficiency virus infection) 2009    CD4 count 100, VL 13800 (05/01/2010)  . Diabetes type 2, uncontrolled     HgA1c 17.6 (04/27/2010)  . Hypertension   . Genital warts   . Erectile dysfunction   . Chronic knee pain     right  . Osteomyelitis     h/o hand  . Diabetes mellitus   . Chronic pain   . AIDS     No past surgical history on file.  Family History  Problem Relation Age of Onset  . Hypertension Mother   . Arthritis Father   . Hypertension Father   . Hypertension Brother   . Cancer Maternal Grandmother 17    unknown type of cancer  . Depression Paternal Grandmother     History  Substance Use Topics  . Smoking status: Never Smoker   .  Smokeless tobacco: Never Used  . Alcohol Use: No     Review of Systems  Constitutional: Positive for fatigue. Negative for fever and diaphoresis.  Eyes: Positive for visual disturbance.  Respiratory: Negative for shortness of breath.   Cardiovascular: Negative for chest pain.  Gastrointestinal: Positive for nausea and vomiting. Negative for abdominal pain.  Endocrine: Positive for polydipsia and polyuria.  Genitourinary: Positive for frequency. Negative for dysuria and hematuria.  Skin: Negative for pallor and rash.  Neurological: Positive for weakness. Negative for dizziness, light-headedness and numbness.  All other systems reviewed and are negative.    Allergies  Sulfa antibiotics  Home Medications   Current Outpatient Rx  Name  Route  Sig  Dispense  Refill  . aspirin 81 MG EC tablet   Oral   Take 81 mg by mouth every morning.         Marland Kitchen atenolol (TENORMIN) 50 MG tablet   Oral   Take 50 mg by mouth every morning.         . dapsone 100 MG tablet   Oral   Take 100 mg by mouth every morning.         . Darunavir Ethanolate (PREZISTA) 800 MG tablet   Oral   Take 1 tablet (800 mg total) by mouth daily with breakfast.   30 tablet   5   . emtricitabine-tenofovir (TRUVADA) 200-300 MG per tablet   Oral   Take 1 tablet by mouth daily.  30 tablet   5   . glipiZIDE (GLUCOTROL XL) 5 MG 24 hr tablet   Oral   Take 1 tablet (5 mg total) by mouth daily.   30 tablet   1   . insulin glargine (LANTUS) 100 UNIT/ML injection   Subcutaneous   Inject 50 Units into the skin at bedtime.         Marland Kitchen lisinopril (PRINIVIL,ZESTRIL) 40 MG tablet   Oral   Take 1 tablet (40 mg total) by mouth daily.   30 tablet   5   . metFORMIN (GLUCOPHAGE) 500 MG tablet   Oral   Take 500 mg by mouth 2 (two) times daily with a meal.         . raltegravir (ISENTRESS) 400 MG tablet   Oral   Take 1 tablet (400 mg total) by mouth 2 (two) times daily.   60 tablet   5   . ritonavir  (NORVIR) 100 MG TABS   Oral   Take 1 tablet (100 mg total) by mouth daily with breakfast.   30 tablet   5   . testosterone (ANDROGEL) 50 MG/5GM GEL   Transdermal   Place 5 g onto the skin daily.            BP 128/74  Pulse 71  Temp(Src) 97.5 F (36.4 C) (Oral)  Resp 16  SpO2 100%  Physical Exam  Nursing note and vitals reviewed. Constitutional: He is oriented to person, place, and time. He appears well-developed and well-nourished. No distress.  HENT:  Head: Normocephalic and atraumatic.  Nose: Nose normal.  Mouth/Throat: No oropharyngeal exudate.  Mucous membranes dry and clear without exudate  Eyes: Conjunctivae and EOM are normal. No scleral icterus.  Neck: Normal range of motion. Neck supple.  Cardiovascular: Normal rate, regular rhythm, normal heart sounds and intact distal pulses.   Pulmonary/Chest: Effort normal and breath sounds normal. No respiratory distress. He has no wheezes. He has no rales.  Abdominal: Soft. Bowel sounds are normal. There is no tenderness. There is no rebound and no guarding.  Musculoskeletal: Normal range of motion. He exhibits no edema.  Lymphadenopathy:    He has no cervical adenopathy.  Neurological: He is alert and oriented to person, place, and time.  Skin: Skin is warm and dry. No rash noted. He is not diaphoretic. No erythema. No pallor.  Psychiatric: He has a normal mood and affect. His behavior is normal.    ED Course  Procedures (including critical care time)  Labs Reviewed  GLUCOSE, CAPILLARY - Abnormal; Notable for the following:    Glucose-Capillary 582 (*)    All other components within normal limits  CBC WITH DIFFERENTIAL - Abnormal; Notable for the following:    HCT 38.3 (*)    MCV 77.8 (*)    All other components within normal limits  COMPREHENSIVE METABOLIC PANEL - Abnormal; Notable for the following:    Sodium 124 (*)    Chloride 88 (*)    Glucose, Bld 665 (*)    All other components within normal limits   URINALYSIS, ROUTINE W REFLEX MICROSCOPIC - Abnormal; Notable for the following:    Color, Urine STRAW (*)    Specific Gravity, Urine 1.031 (*)    Glucose, UA >1000 (*)    All other components within normal limits  URINE MICROSCOPIC-ADD ON   Dg Chest 2 View  07/19/2012  *RADIOLOGY REPORT*  Clinical Data: Current history diabetes, presenting with hyperglycemia.  CHEST - 2 VIEW  Comparison: Two-view chest x-ray 03/27/2012, 04/06/2011, 12/09/2009.  Findings: Cardiac silhouette normal in size, unchanged.  Thoracic aorta mildly tortuous, unchanged.  Hilar and mediastinal contours otherwise unremarkable.  Lungs clear.  Bronchovascular markings normal.  Pulmonary vascularity normal.  No pneumothorax.  No pleural effusions.  Visualized bony thorax intact.  No significant interval change.  IMPRESSION: No acute cardiopulmonary disease.  Stable examination.   Original Report Authenticated By: Evangeline Dakin, M.D.     1. Hyperglycemia     MDM  Patient with PMH of diabetes presents for high blood sugar, polyuria, and polydypsia. When calling patient from waiting room, patient found to be eating Fritos and ginger ale. CBG on initial presentation 582. W/u to include CBC, BMP, UA, and CXR. IVF bolus ordered. Patient well and nontoxic appearing in NAD.  Patient found to be sneaking more Fritos. Blood glucose on CMP 665. Patient has normal anion gap of 10, not in DKA. Second IVF bolus ordered as well as 10 units IV insulin. Have discussed with patient that he is undermining appropriate treatment of his hyperglycemia by sneaking food. Patient has been firmly told that no more food or soda will be tolerated that that he may drink water if he is thirsty. Patient verbalizes understanding.  Patient's CBG down to 402. UA positive for glucose and negative for infection and ketones. Patient has remained well and nontoxic appearing, sleeping comfortably in the bed.  CBG to 348 with IVF hydration. Will administer 10  units of SQ insulin and d/c. Patient endorses resolution of polyuria and polydypsia. Have advised follow up with his PCP for further evaluation of his diabetes and current regimen as well as adequate fluid hydration. Patient states comfort and understanding with d/c plan with no unaddressed concerns. Patient work up and management discussed with Dr. Betsey Holiday who is in agreement.  Filed Vitals:   07/19/12 1002 07/19/12 1029 07/19/12 1219 07/19/12 1441  BP: 115/79 126/78 153/107 128/74  Pulse: 92 89 67 71  Temp: 97.5 F (36.4 C)     TempSrc: Oral     Resp: 22 20 16 16   SpO2: 97% 99% 100% 100%     Antonietta Breach, PA-C 07/21/12 2217

## 2012-07-20 ENCOUNTER — Other Ambulatory Visit (INDEPENDENT_AMBULATORY_CARE_PROVIDER_SITE_OTHER): Payer: Medicaid Other

## 2012-07-20 DIAGNOSIS — B20 Human immunodeficiency virus [HIV] disease: Secondary | ICD-10-CM

## 2012-07-20 LAB — COMPLETE METABOLIC PANEL WITH GFR
ALT: 11 U/L (ref 0–53)
AST: 13 U/L (ref 0–37)
Albumin: 4.1 g/dL (ref 3.5–5.2)
Alkaline Phosphatase: 76 U/L (ref 39–117)
BUN: 9 mg/dL (ref 6–23)
CO2: 26 mEq/L (ref 19–32)
Calcium: 9.4 mg/dL (ref 8.4–10.5)
Chloride: 97 mEq/L (ref 96–112)
Creat: 0.86 mg/dL (ref 0.50–1.35)
GFR, Est African American: >89 mL/min
GFR, Est Non African American: >89 mL/min
Glucose, Bld: 454 mg/dL — ABNORMAL HIGH (ref 70–99)
Potassium: 4.4 mEq/L (ref 3.5–5.3)
Sodium: 134 mEq/L — ABNORMAL LOW (ref 135–145)
Total Bilirubin: 0.4 mg/dL (ref 0.3–1.2)
Total Protein: 7.7 g/dL (ref 6.0–8.3)

## 2012-07-20 LAB — CBC WITH DIFFERENTIAL/PLATELET
Basophils Absolute: 0 10*3/uL (ref 0.0–0.1)
Basophils Relative: 0 % (ref 0–1)
Eosinophils Absolute: 0.1 10*3/uL (ref 0.0–0.7)
Eosinophils Relative: 1 % (ref 0–5)
HCT: 39.8 % (ref 39.0–52.0)
Hemoglobin: 13.5 g/dL (ref 13.0–17.0)
Lymphocytes Relative: 25 % (ref 12–46)
Lymphs Abs: 1.5 10*3/uL (ref 0.7–4.0)
MCH: 26.6 pg (ref 26.0–34.0)
MCHC: 33.9 g/dL (ref 30.0–36.0)
MCV: 78.5 fL (ref 78.0–100.0)
Monocytes Absolute: 0.3 10*3/uL (ref 0.1–1.0)
Monocytes Relative: 6 % (ref 3–12)
Neutro Abs: 3.9 10*3/uL (ref 1.7–7.7)
Neutrophils Relative %: 68 % (ref 43–77)
Platelets: 182 10*3/uL (ref 150–400)
RBC: 5.07 MIL/uL (ref 4.22–5.81)
RDW: 14 % (ref 11.5–15.5)
WBC: 5.8 10*3/uL (ref 4.0–10.5)

## 2012-07-20 LAB — GLUCOSE, CAPILLARY
Glucose-Capillary: 402 mg/dL — ABNORMAL HIGH (ref 70–99)
Glucose-Capillary: 348 mg/dL — ABNORMAL HIGH (ref 70–99)

## 2012-07-21 LAB — HIV-1 RNA QUANT-NO REFLEX-BLD
HIV 1 RNA Quant: 54788 copies/mL — ABNORMAL HIGH (ref <20)
HIV-1 RNA Quant, Log: 4.74 {Log} — ABNORMAL HIGH (ref <1.30)

## 2012-07-21 LAB — T-HELPER CELL (CD4) - (RCID CLINIC ONLY)
CD4 % Helper T Cell: 4 % — ABNORMAL LOW (ref 33–55)
CD4 T Cell Abs: 50 uL — ABNORMAL LOW (ref 400–2700)

## 2012-07-21 NOTE — ED Provider Notes (Signed)
Medical screening examination/treatment/procedure(s) were performed by non-physician practitioner and as supervising physician I was immediately available for consultation/collaboration.  Orpah Greek, MD 07/21/12 2220

## 2012-07-28 ENCOUNTER — Emergency Department (HOSPITAL_COMMUNITY): Payer: Medicaid Other

## 2012-07-28 ENCOUNTER — Emergency Department (HOSPITAL_COMMUNITY)
Admission: EM | Admit: 2012-07-28 | Discharge: 2012-07-28 | Disposition: A | Discharge status: Home / Self Care | Payer: Medicaid Other | Attending: Emergency Medicine | Admitting: Emergency Medicine

## 2012-07-28 ENCOUNTER — Other Ambulatory Visit: Payer: Self-pay | Admitting: Internal Medicine

## 2012-07-28 ENCOUNTER — Encounter (HOSPITAL_COMMUNITY): Payer: Self-pay

## 2012-07-28 DIAGNOSIS — E1169 Type 2 diabetes mellitus with other specified complication: Secondary | ICD-10-CM | POA: Insufficient documentation

## 2012-07-28 DIAGNOSIS — Z794 Long term (current) use of insulin: Secondary | ICD-10-CM | POA: Insufficient documentation

## 2012-07-28 DIAGNOSIS — R739 Hyperglycemia, unspecified: Secondary | ICD-10-CM

## 2012-07-28 DIAGNOSIS — Z8739 Personal history of other diseases of the musculoskeletal system and connective tissue: Secondary | ICD-10-CM | POA: Insufficient documentation

## 2012-07-28 DIAGNOSIS — Z87448 Personal history of other diseases of urinary system: Secondary | ICD-10-CM | POA: Insufficient documentation

## 2012-07-28 DIAGNOSIS — Z7982 Long term (current) use of aspirin: Secondary | ICD-10-CM | POA: Insufficient documentation

## 2012-07-28 DIAGNOSIS — M25569 Pain in unspecified knee: Secondary | ICD-10-CM | POA: Insufficient documentation

## 2012-07-28 DIAGNOSIS — Z8619 Personal history of other infectious and parasitic diseases: Secondary | ICD-10-CM | POA: Insufficient documentation

## 2012-07-28 DIAGNOSIS — Z79899 Other long term (current) drug therapy: Secondary | ICD-10-CM | POA: Insufficient documentation

## 2012-07-28 DIAGNOSIS — Z21 Asymptomatic human immunodeficiency virus [HIV] infection status: Secondary | ICD-10-CM | POA: Insufficient documentation

## 2012-07-28 DIAGNOSIS — I10 Essential (primary) hypertension: Secondary | ICD-10-CM | POA: Insufficient documentation

## 2012-07-28 DIAGNOSIS — G8929 Other chronic pain: Secondary | ICD-10-CM | POA: Insufficient documentation

## 2012-07-28 DIAGNOSIS — M25561 Pain in right knee: Secondary | ICD-10-CM

## 2012-07-28 LAB — GLUCOSE, CAPILLARY
Glucose-Capillary: 552 mg/dL (ref 70–99)
Glucose-Capillary: 524 mg/dL — ABNORMAL HIGH (ref 70–99)
Glucose-Capillary: 405 mg/dL — ABNORMAL HIGH (ref 70–99)
Glucose-Capillary: 372 mg/dL — ABNORMAL HIGH (ref 70–99)
Glucose-Capillary: 398 mg/dL — ABNORMAL HIGH (ref 70–99)

## 2012-07-28 LAB — URINALYSIS, ROUTINE W REFLEX MICROSCOPIC
Bilirubin Urine: NEGATIVE
Glucose, UA: >1000 mg/dL — AB
Hgb urine dipstick: NEGATIVE
Ketones, ur: 15 mg/dL — AB
Leukocytes, UA: NEGATIVE
Nitrite: NEGATIVE
Protein, ur: NEGATIVE mg/dL
Specific Gravity, Urine: 1.036 — ABNORMAL HIGH (ref 1.005–1.030)
Urobilinogen, UA: 0.2 mg/dL (ref 0.0–1.0)
pH: 5 (ref 5.0–8.0)

## 2012-07-28 LAB — BASIC METABOLIC PANEL
BUN: 19 mg/dL (ref 6–23)
CO2: 28 mEq/L (ref 19–32)
Calcium: 10.2 mg/dL (ref 8.4–10.5)
Chloride: 93 mEq/L — ABNORMAL LOW (ref 96–112)
Creatinine, Ser: 0.79 mg/dL (ref 0.50–1.35)
GFR calc Af Amer: >90 mL/min (ref >90)
GFR calc non Af Amer: >90 mL/min (ref >90)
Glucose, Bld: 427 mg/dL — ABNORMAL HIGH (ref 70–99)
Potassium: 5.1 mEq/L (ref 3.5–5.1)
Sodium: 133 mEq/L — ABNORMAL LOW (ref 135–145)

## 2012-07-28 LAB — CBC
HCT: 39.1 % (ref 39.0–52.0)
Hemoglobin: 14.1 g/dL (ref 13.0–17.0)
MCH: 27.3 pg (ref 26.0–34.0)
MCHC: 36.1 g/dL — ABNORMAL HIGH (ref 30.0–36.0)
MCV: 75.6 fL — ABNORMAL LOW (ref 78.0–100.0)
Platelets: 208 10*3/uL (ref 150–400)
RBC: 5.17 MIL/uL (ref 4.22–5.81)
RDW: 12.7 % (ref 11.5–15.5)
WBC: 8.2 10*3/uL (ref 4.0–10.5)

## 2012-07-28 LAB — URINE MICROSCOPIC-ADD ON

## 2012-07-28 MED ORDER — HYDROCODONE-ACETAMINOPHEN 5-325 MG PO TABS: 1.0000 | ORAL_TABLET | Freq: Four times a day (QID) | ORAL | Status: DC | PRN

## 2012-07-28 MED ORDER — INSULIN ASPART 100 UNIT/ML ~~LOC~~ SOLN
6.0000 [IU] | Freq: Once | SUBCUTANEOUS | Status: AC
  Administered 2012-07-28: 6 [IU] via INTRAVENOUS
  Filled 2012-07-28: qty 1

## 2012-07-28 MED ORDER — OXYCODONE-ACETAMINOPHEN 5-325 MG PO TABS: 1.0000 | ORAL_TABLET | Freq: Four times a day (QID) | ORAL | Status: DC | PRN

## 2012-07-28 MED ORDER — SODIUM CHLORIDE 0.9 % IV BOLUS (SEPSIS)
2000.0000 mL | Freq: Once | INTRAVENOUS | Status: AC
  Administered 2012-07-28: 2000 mL via INTRAVENOUS

## 2012-07-28 MED ORDER — HYDROCODONE-ACETAMINOPHEN 5-325 MG PO TABS
1.0000 | ORAL_TABLET | Freq: Once | ORAL | Status: AC
  Administered 2012-07-28: 1 via ORAL
  Filled 2012-07-28: qty 1
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
## 2012-07-28 NOTE — ED Provider Notes (Signed)
Medical screening examination/treatment/procedure(s) were performed by non-physician practitioner and as supervising physician I was immediately available for consultation/collaboration.    Kathalene Frames, MD 07/28/12 [phone]
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
## 2012-07-28 NOTE — ED Notes (Signed)
Notified RN of high blood sugar of 524

## 2012-07-28 NOTE — ED Notes (Signed)
Pt states he fell this morning and injured his knee. States he checked his BS at home and it was greater than 500.

## 2012-08-03 ENCOUNTER — Encounter: Payer: Self-pay | Admitting: Internal Medicine

## 2012-08-03 ENCOUNTER — Ambulatory Visit (INDEPENDENT_AMBULATORY_CARE_PROVIDER_SITE_OTHER): Payer: Medicaid Other | Admitting: Infectious Diseases

## 2012-08-03 ENCOUNTER — Ambulatory Visit (INDEPENDENT_AMBULATORY_CARE_PROVIDER_SITE_OTHER): Payer: Medicare Other | Admitting: Internal Medicine

## 2012-08-03 ENCOUNTER — Encounter: Payer: Self-pay | Admitting: Infectious Diseases

## 2012-08-03 VITALS — BP 120/80 | HR 88 | Temp 97.2°F | Ht 69.0 in | Wt 250.0 lb

## 2012-08-03 VITALS — BP 129/84 | HR 107 | Temp 97.6°F | Ht 69.0 in | Wt 250.0 lb

## 2012-08-03 DIAGNOSIS — B20 Human immunodeficiency virus [HIV] disease: Secondary | ICD-10-CM

## 2012-08-03 DIAGNOSIS — E1165 Type 2 diabetes mellitus with hyperglycemia: Secondary | ICD-10-CM

## 2012-08-03 DIAGNOSIS — IMO0002 Reserved for concepts with insufficient information to code with codable children: Secondary | ICD-10-CM

## 2012-08-03 DIAGNOSIS — IMO0001 Reserved for inherently not codable concepts without codable children: Secondary | ICD-10-CM

## 2012-08-03 DIAGNOSIS — R7309 Other abnormal glucose: Secondary | ICD-10-CM

## 2012-08-03 DIAGNOSIS — R739 Hyperglycemia, unspecified: Secondary | ICD-10-CM

## 2012-08-03 LAB — BASIC METABOLIC PANEL
BUN: 14 mg/dL (ref 6–23)
CO2: 26 mEq/L (ref 19–32)
Calcium: 10.2 mg/dL (ref 8.4–10.5)
Chloride: 89 mEq/L — ABNORMAL LOW (ref 96–112)
Creat: 0.67 mg/dL (ref 0.50–1.35)
Glucose, Bld: 669 mg/dL (ref 70–99)
Potassium: 4.4 mEq/L (ref 3.5–5.3)
Sodium: 127 mEq/L — ABNORMAL LOW (ref 135–145)

## 2012-08-03 LAB — GLUCOSE, CAPILLARY
Glucose-Capillary: >600 mg/dL (ref 70–99)
Glucose-Capillary: >600 mg/dL (ref 70–99)
Glucose-Capillary: 526 mg/dL — ABNORMAL HIGH (ref 70–99)

## 2012-08-03 MED ORDER — SODIUM CHLORIDE 0.9 % IV BOLUS (SEPSIS): 1000.0000 mL | Freq: Once | INTRAVENOUS | Status: DC

## 2012-08-03 MED ORDER — INSULIN REGULAR HUMAN 100 UNIT/ML IJ SOLN
10.0000 [IU] | Freq: Once | INTRAMUSCULAR | Status: AC
  Administered 2012-08-03: 10 [IU] via SUBCUTANEOUS

## 2012-08-03 NOTE — Progress Notes (Signed)
IV d/c - 1000 cc NS infused. IV d/c DSD applied. Hilda Blades Journiee Feldkamp RN 08/03/12 5:30PM

## 2012-08-03 NOTE — Assessment & Plan Note (Signed)
Pt not taking meds as noted by his labs. He is getting help from bridge counselor. He will need lots more help still. Will see him back in the next 6 weeks.

## 2012-08-03 NOTE — Progress Notes (Signed)
1535PM- IV started in right hand with #22G angiocath; lab drawn - flushed well. Site unremarkable.

## 2012-08-03 NOTE — Progress Notes (Signed)
NS 1000 bolus ran over 1 hour.  Sander Nephew, RN 08/03/2012 5:05 PM.

## 2012-08-03 NOTE — Progress Notes (Signed)
  Subjective:    [name], male    DOB: July 04, 1965, 47 y.o.   MRN: [name]  HPI  Pt presents acutely from [facility] per Dr. [name] for hyperglycemia with cbg >600.  Pt with polyuria and polydipsia, denies abdominal pain, fever, fatigue, chest pain or shortness of breath. Pt states that he has been w/o Lantus for ~ 2 weeks.  Review of Systems  Constitutional: Negative for fever.  Respiratory: Negative for shortness of breath.   Cardiovascular: Negative for chest pain.  Gastrointestinal: Negative for nausea and vomiting.  Endocrine: Positive for polydipsia and polyuria.  Genitourinary: Negative for dysuria.  Neurological: Negative for weakness, light-headedness and headaches.      Objective:   Physical Exam  Constitutional: He is oriented to person, place, and time. He appears well-developed and well-nourished. No distress.  AA male  HENT:  Head: Normocephalic and atraumatic.  Cardiovascular: Normal rate, regular rhythm, normal heart sounds and intact distal pulses.   Pulmonary/Chest: Effort normal and breath sounds normal.  Abdominal: Soft. Bowel sounds are normal.  Musculoskeletal: Normal range of motion.  Neurological: He is alert and oriented to person, place, and time.  Skin: Skin is warm and dry.  Psychiatric: He has a normal mood and affect.          Assessment & Plan:  1. Hyperglycemia with poorly controlled Diabetes Mellitus:secondary to poor compliance, no insulin x 2 weeks -Received 1 L NS bolus -CBG on recheck after bolus 526. -will give insulin regular 10 units in clinic for likely peak  ~2.5h (pt states that he is going to  "get something else to eat" and is anxious to leave clinic) -pt given sample of Lantus pen with instructions to take 50 units this evening and increase fluid intake --->> AG 4 -advise to f/u with PCP and warning signs of DKA and need for presentation to ED for symptoms including abdominal pain, light-headness, N/V BMET    Component Value Date/Time   NA 127* 08/03/2012 1537   K 4.4 08/03/2012 1537   CL 89* 08/03/2012 1537   CO2 26 08/03/2012 1537   GLUCOSE 669* 08/03/2012 1537   BUN 14 08/03/2012 1537   CREATININE 0.67 08/03/2012 1537   CREATININE 0.79 07/28/2012 1549   CALCIUM 10.2 08/03/2012 1537   GFRNONAA >90 07/28/2012 1549   GFRAA >90 07/28/2012 1549    2. HIV: poorly controlled, noncompliant, Viral load >54K copies/mL, CD4 ~50 cmm

## 2012-08-03 NOTE — Assessment & Plan Note (Signed)
His FSG is > 600. Will speak with IM clinic.Marland KitchenMarland Kitchen
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
## 2012-08-03 NOTE — Patient Instructions (Addendum)
Your blood sugars were very elevated again in the 600s. It is very important to take your medication as prescribed. We have  given you a little of regular insulin and expect you to re-start the Lantus this evening. You should also drink plenty of water starting now and until you take the Lantus tonight. You should take Lantus 50 units every evening. Follow-up with your doctor in 1 week to review your medications.

## 2012-08-10 NOTE — Progress Notes (Signed)
Case discussed with Dr. Dorian Heckle  at the time of the visit, immediately after the resident saw the patient.  I reviewed the resident's history and exam and pertinent patient test results.  I agree with the assessment, diagnosis and plan of care documented in the resident's note.

## 2012-09-03 ENCOUNTER — Encounter (HOSPITAL_COMMUNITY): Payer: Self-pay | Admitting: Emergency Medicine

## 2012-09-03 ENCOUNTER — Emergency Department (HOSPITAL_COMMUNITY)
Admission: EM | Admit: 2012-09-03 | Discharge: 2012-09-04 | Disposition: A | Discharge status: Home / Self Care | Payer: Medicaid Other | Source: Home / Self Care | Attending: Emergency Medicine | Admitting: Emergency Medicine

## 2012-09-03 ENCOUNTER — Emergency Department (HOSPITAL_COMMUNITY): Payer: Medicaid Other

## 2012-09-03 DIAGNOSIS — G8929 Other chronic pain: Secondary | ICD-10-CM | POA: Insufficient documentation

## 2012-09-03 DIAGNOSIS — Z7982 Long term (current) use of aspirin: Secondary | ICD-10-CM | POA: Insufficient documentation

## 2012-09-03 DIAGNOSIS — M25569 Pain in unspecified knee: Secondary | ICD-10-CM | POA: Insufficient documentation

## 2012-09-03 DIAGNOSIS — Z8619 Personal history of other infectious and parasitic diseases: Secondary | ICD-10-CM | POA: Insufficient documentation

## 2012-09-03 DIAGNOSIS — Z79899 Other long term (current) drug therapy: Secondary | ICD-10-CM | POA: Insufficient documentation

## 2012-09-03 DIAGNOSIS — I10 Essential (primary) hypertension: Secondary | ICD-10-CM | POA: Insufficient documentation

## 2012-09-03 DIAGNOSIS — B20 Human immunodeficiency virus [HIV] disease: Secondary | ICD-10-CM | POA: Insufficient documentation

## 2012-09-03 DIAGNOSIS — W172XXA Fall into hole, initial encounter: Secondary | ICD-10-CM | POA: Insufficient documentation

## 2012-09-03 DIAGNOSIS — IMO0002 Reserved for concepts with insufficient information to code with codable children: Secondary | ICD-10-CM | POA: Insufficient documentation

## 2012-09-03 DIAGNOSIS — R739 Hyperglycemia, unspecified: Secondary | ICD-10-CM

## 2012-09-03 DIAGNOSIS — Y9289 Other specified places as the place of occurrence of the external cause: Secondary | ICD-10-CM | POA: Insufficient documentation

## 2012-09-03 DIAGNOSIS — M25561 Pain in right knee: Secondary | ICD-10-CM

## 2012-09-03 DIAGNOSIS — E1169 Type 2 diabetes mellitus with other specified complication: Secondary | ICD-10-CM | POA: Insufficient documentation

## 2012-09-03 DIAGNOSIS — Y9389 Activity, other specified: Secondary | ICD-10-CM | POA: Insufficient documentation

## 2012-09-03 DIAGNOSIS — Z87448 Personal history of other diseases of urinary system: Secondary | ICD-10-CM | POA: Insufficient documentation

## 2012-09-03 DIAGNOSIS — R3589 Other polyuria: Secondary | ICD-10-CM | POA: Insufficient documentation

## 2012-09-03 DIAGNOSIS — Z8739 Personal history of other diseases of the musculoskeletal system and connective tissue: Secondary | ICD-10-CM | POA: Insufficient documentation

## 2012-09-03 DIAGNOSIS — Z794 Long term (current) use of insulin: Secondary | ICD-10-CM | POA: Insufficient documentation

## 2012-09-03 DIAGNOSIS — S99919A Unspecified injury of unspecified ankle, initial encounter: Secondary | ICD-10-CM | POA: Insufficient documentation

## 2012-09-03 DIAGNOSIS — R358 Other polyuria: Secondary | ICD-10-CM | POA: Insufficient documentation

## 2012-09-03 DIAGNOSIS — R631 Polydipsia: Secondary | ICD-10-CM | POA: Insufficient documentation

## 2012-09-03 DIAGNOSIS — E1165 Type 2 diabetes mellitus with hyperglycemia: Secondary | ICD-10-CM | POA: Insufficient documentation

## 2012-09-03 DIAGNOSIS — S8990XA Unspecified injury of unspecified lower leg, initial encounter: Secondary | ICD-10-CM | POA: Insufficient documentation

## 2012-09-03 LAB — URINALYSIS, ROUTINE W REFLEX MICROSCOPIC
Bilirubin Urine: NEGATIVE
Glucose, UA: >1000 mg/dL — AB
Hgb urine dipstick: NEGATIVE
Ketones, ur: NEGATIVE mg/dL
Leukocytes, UA: NEGATIVE
Nitrite: NEGATIVE
Protein, ur: NEGATIVE mg/dL
Specific Gravity, Urine: 1.036 — ABNORMAL HIGH (ref 1.005–1.030)
Urobilinogen, UA: 1 mg/dL (ref 0.0–1.0)
pH: 5.5 (ref 5.0–8.0)

## 2012-09-03 LAB — COMPREHENSIVE METABOLIC PANEL
ALT: 10 U/L (ref 0–53)
AST: 18 U/L (ref 0–37)
Albumin: 3.7 g/dL (ref 3.5–5.2)
Alkaline Phosphatase: 81 U/L (ref 39–117)
BUN: 10 mg/dL (ref 6–23)
CO2: 25 mEq/L (ref 19–32)
Calcium: 9.5 mg/dL (ref 8.4–10.5)
Chloride: 95 mEq/L — ABNORMAL LOW (ref 96–112)
Creatinine, Ser: 0.65 mg/dL (ref 0.50–1.35)
GFR calc Af Amer: >90 mL/min (ref >90)
GFR calc non Af Amer: >90 mL/min (ref >90)
Glucose, Bld: 536 mg/dL — ABNORMAL HIGH (ref 70–99)
Potassium: 4.3 mEq/L (ref 3.5–5.1)
Sodium: 132 mEq/L — ABNORMAL LOW (ref 135–145)
Total Bilirubin: 0.3 mg/dL (ref 0.3–1.2)
Total Protein: 7.9 g/dL (ref 6.0–8.3)

## 2012-09-03 LAB — GLUCOSE, CAPILLARY
Glucose-Capillary: >600 mg/dL (ref 70–99)
Glucose-Capillary: 529 mg/dL — ABNORMAL HIGH (ref 70–99)
Glucose-Capillary: 475 mg/dL — ABNORMAL HIGH (ref 70–99)

## 2012-09-03 LAB — CBC
HCT: 35.9 % — ABNORMAL LOW (ref 39.0–52.0)
Hemoglobin: 12.4 g/dL — ABNORMAL LOW (ref 13.0–17.0)
MCH: 27.1 pg (ref 26.0–34.0)
MCHC: 34.5 g/dL (ref 30.0–36.0)
MCV: 78.6 fL (ref 78.0–100.0)
Platelets: 180 10*3/uL (ref 150–400)
RBC: 4.57 MIL/uL (ref 4.22–5.81)
RDW: 13.2 % (ref 11.5–15.5)
WBC: 4.9 10*3/uL (ref 4.0–10.5)

## 2012-09-03 LAB — URINE MICROSCOPIC-ADD ON

## 2012-09-03 MED ORDER — SODIUM CHLORIDE 0.9 % IV SOLN
1000.0000 mL | Freq: Once | INTRAVENOUS | Status: AC
  Administered 2012-09-03: 1000 mL via INTRAVENOUS

## 2012-09-03 MED ORDER — INSULIN ASPART 100 UNIT/ML ~~LOC~~ SOLN
10.0000 [IU] | Freq: Once | SUBCUTANEOUS | Status: AC
  Administered 2012-09-04: 10 [IU] via SUBCUTANEOUS
  Filled 2012-09-03: qty 1

## 2012-09-03 MED ORDER — INSULIN ASPART 100 UNIT/ML ~~LOC~~ SOLN
10.0000 [IU] | Freq: Once | SUBCUTANEOUS | Status: AC
  Administered 2012-09-03: 10 [IU] via SUBCUTANEOUS
  Filled 2012-09-03: qty 1

## 2012-09-03 MED ORDER — HYDROCODONE-ACETAMINOPHEN 5-325 MG PO TABS
1.0000 | ORAL_TABLET | Freq: Once | ORAL | Status: AC
  Administered 2012-09-03: 1 via ORAL
  Filled 2012-09-03: qty 1

## 2012-09-03 MED ORDER — SODIUM CHLORIDE 0.9 % IV SOLN
1000.0000 mL | INTRAVENOUS | Status: DC
  Administered 2012-09-03: 1000 mL via INTRAVENOUS

## 2012-09-03 NOTE — ED Notes (Signed)
Pt states he fell today, and injured his rt knee. Pt also presents to ED elevated b/p. Pt states he took his medication for diabetes but did not check his blood sugar today.

## 2012-09-03 NOTE — ED Notes (Signed)
NURSE FIRST ROUNDS : UNABLE TO LOCATE PT. AT TRIAGE SEVERAL TIMES AT [place].

## 2012-09-03 NOTE — ED Provider Notes (Signed)
History     CSN: [id number]  Arrival date & time 09/03/12  1523   First MD Initiated Contact with Patient 09/03/12 2047      Chief Complaint  Patient presents with  . Hyperglycemia    (Consider location/radiation/quality/duration/timing/severity/associated sxs/prior treatment) HPI  Patient is a 47 year old male past medical history significant for AIDS, poorly controlled DM, hypertension, chronic right knee pain presents to the emergency department for constant sharp non-radiating 10/10 right knee pain without change in symptoms from previous workups at previous visits. It was noted in the waiting room that the patient's blood sugar was above 600. Patient states he has been using his home diabetes medications. Patient complains of polyuria and polydipsia. Denies fevers, chills, chest pain, shortness of breath, abdominal pain, nausea, vomiting.  Past Medical History  Diagnosis Date  . HIV (human immunodeficiency virus infection) 2009    CD4 count 100, VL 13800 (05/01/2010)  . Diabetes type 2, uncontrolled     HgA1c 17.6 (04/27/2010)  . Hypertension   . Genital warts   . Erectile dysfunction   . Chronic knee pain     right  . Osteomyelitis     h/o hand  . Diabetes mellitus   . Chronic pain   . AIDS     History reviewed. No pertinent past surgical history.  Family History  Problem Relation Age of Onset  . Hypertension Mother   . Arthritis Father   . Hypertension Father   . Hypertension Brother   . Cancer Maternal Grandmother 58    unknown type of cancer  . Depression Paternal Grandmother     History  Substance Use Topics  . Smoking status: Never Smoker   . Smokeless tobacco: Never Used  . Alcohol Use: No      Review of Systems  Constitutional: Negative for fever and chills.  HENT: Negative.   Eyes: Negative for pain.  Respiratory: Negative for shortness of breath.   Cardiovascular: Negative for chest pain.  Gastrointestinal: Negative for nausea, vomiting  and abdominal pain.  Endocrine: Positive for polydipsia and polyuria.  Genitourinary: Negative for flank pain.  Musculoskeletal: Positive for joint swelling.  Neurological: Negative for headaches.    Allergies  Sulfa antibiotics  Home Medications   Current Outpatient Rx  Name  Route  Sig  Dispense  Refill  . aspirin 81 MG EC tablet   Oral   Take 81 mg by mouth every morning.         Marland Kitchen atenolol (TENORMIN) 50 MG tablet   Oral   Take 50 mg by mouth every morning.         . dapsone 100 MG tablet   Oral   Take 100 mg by mouth every morning.         . Darunavir Ethanolate (PREZISTA) 800 MG tablet   Oral   Take 1 tablet (800 mg total) by mouth daily with breakfast.   30 tablet   5   . emtricitabine-tenofovir (TRUVADA) 200-300 MG per tablet   Oral   Take 1 tablet by mouth daily.   30 tablet   5   . glipiZIDE (GLUCOTROL XL) 5 MG 24 hr tablet   Oral   Take 1 tablet (5 mg total) by mouth daily.   30 tablet   1   . insulin glargine (LANTUS) 100 UNIT/ML injection   Subcutaneous   Inject 50 Units into the skin at bedtime.         Marland Kitchen lisinopril (PRINIVIL,ZESTRIL) 40 MG  tablet   Oral   Take 1 tablet (40 mg total) by mouth daily.   30 tablet   5   . metFORMIN (GLUCOPHAGE) 500 MG tablet   Oral   Take 500 mg by mouth 2 (two) times daily with a meal.         . oxyCODONE-acetaminophen (PERCOCET/ROXICET) 5-325 MG per tablet   Oral   Take 1 tablet by mouth every 6 (six) hours as needed for pain.   15 tablet   0   . raltegravir (ISENTRESS) 400 MG tablet   Oral   Take 1 tablet (400 mg total) by mouth 2 (two) times daily.   60 tablet   5   . ritonavir (NORVIR) 100 MG TABS   Oral   Take 1 tablet (100 mg total) by mouth daily with breakfast.   30 tablet   5   . testosterone (ANDROGEL) 50 MG/5GM GEL   Transdermal   Place 5 g onto the skin daily.            BP 140/97  Pulse 88  Temp(Src) 97.4 F (36.3 C) (Oral)  Resp 14  SpO2 98%  Physical Exam   Constitutional: He is oriented to person, place, and time. He appears well-developed and well-nourished.  HENT:  Head: Normocephalic and atraumatic.  Mouth/Throat: Oropharynx is clear and moist.  Eyes: Conjunctivae are normal.  Cardiovascular: Normal rate, regular rhythm and normal heart sounds.   Pulmonary/Chest: Effort normal and breath sounds normal.  Abdominal: Soft. Bowel sounds are normal.  Musculoskeletal:       Right knee: He exhibits normal range of motion, no swelling, no effusion, no ecchymosis and no deformity. Tenderness found.  Neurological: He is alert and oriented to person, place, and time.  Skin: Skin is warm and dry.  Psychiatric: He has a normal mood and affect.    ED Course  Procedures (including critical care time)   Labs Reviewed  GLUCOSE, CAPILLARY - Abnormal; Notable for the following:    Glucose-Capillary >600 (*)    All other components within normal limits  URINALYSIS, ROUTINE W REFLEX MICROSCOPIC - Abnormal; Notable for the following:    Specific Gravity, Urine 1.036 (*)    Glucose, UA >1000 (*)    All other components within normal limits  CBC - Abnormal; Notable for the following:    Hemoglobin 12.4 (*)    HCT 35.9 (*)    All other components within normal limits  COMPREHENSIVE METABOLIC PANEL - Abnormal; Notable for the following:    Sodium 132 (*)    Chloride 95 (*)    Glucose, Bld 536 (*)    All other components within normal limits  GLUCOSE, CAPILLARY - Abnormal; Notable for the following:    Glucose-Capillary 529 (*)    All other components within normal limits  GLUCOSE, CAPILLARY - Abnormal; Notable for the following:    Glucose-Capillary 475 (*)    All other components within normal limits  URINE MICROSCOPIC-ADD ON   No anion gap.   Dg Knee Complete 4 Views Right  09/03/2012   *RADIOLOGY REPORT*  Clinical Data: History of injury from fall with painful right knee.  RIGHT KNEE - COMPLETE 4+ VIEW  Comparison: 07/28/2012.  Findings:  No definite joint effusion is seen. Alignment is normal.  Joint spaces are preserved.  No fracture or dislocation is evident.  No soft tissue lesions are seen.  IMPRESSION: No fracture or dislocation is evident.   Original Report Authenticated By: Shanon Brow Call  1. Hyperglycemia without ketosis   2. Chronic knee pain, right       MDM  Patient initially presented for chronic right knee pain without change in symptoms but was noted to have blood sugars greater than 600. Vitals, measuring, labs were reviewed. No evidence of anion gap. No concern for DKA. Patient received IV fluids and insulin with improvement of blood sugar. Patient will be discharged for followup PCP to discuss blood sugar control. Patient advised that no new or changing findings were noted on right knee and should continue symptomatic care such as rice method. Patient d/w with Dr. Roxanne Mins, agrees with plan. Patient is stable at time of discharge           Harlow Mares, PA-C 09/04/12 [id number]

## 2012-09-03 NOTE — ED Notes (Signed)
NURSE FIRST ROUNDS : PT. RETURNED TO WAITING AREA , AMBULATING HALLWAYS , RESPIRATIONS UNLABORED , DENIES PAIN AT THIS TIME.

## 2012-09-03 NOTE — ED Notes (Signed)
Patient updated on waiting process.  Alert and oriented x 3-ambulatory

## 2012-09-03 NOTE — ED Notes (Signed)
Pt presents to ED c/o falling this am with rt knee pain. CBG high in triage, greater than 600.

## 2012-09-03 NOTE — ED Notes (Signed)
NURSE FIRST ROUNDS : NURSE EXPLAINED DELAY AND PROCESS / WAIT TIME , RESPIRATIONS UNLABORED , SEEN EATING AND DRINKING COKE AT [place] , NURSE ADVISED PT. NOT TO EAT UNTIL SEEN BY MD.

## 2012-09-04 ENCOUNTER — Encounter (HOSPITAL_COMMUNITY): Payer: Self-pay | Admitting: *Deleted

## 2012-09-04 LAB — COMPREHENSIVE METABOLIC PANEL
ALT: 14 U/L (ref 0–53)
AST: 23 U/L (ref 0–37)
Albumin: 4 g/dL (ref 3.5–5.2)
Alkaline Phosphatase: 80 U/L (ref 39–117)
BUN: 8 mg/dL (ref 6–23)
CO2: 26 mEq/L (ref 19–32)
Calcium: 9.5 mg/dL (ref 8.4–10.5)
Chloride: 96 mEq/L (ref 96–112)
Creatinine, Ser: 0.66 mg/dL (ref 0.50–1.35)
GFR calc Af Amer: >90 mL/min (ref >90)
GFR calc non Af Amer: >90 mL/min (ref >90)
Glucose, Bld: 477 mg/dL — ABNORMAL HIGH (ref 70–99)
Potassium: 4.4 mEq/L (ref 3.5–5.1)
Sodium: 134 mEq/L — ABNORMAL LOW (ref 135–145)
Total Bilirubin: 0.4 mg/dL (ref 0.3–1.2)
Total Protein: 8.5 g/dL — ABNORMAL HIGH (ref 6.0–8.3)

## 2012-09-04 LAB — GLUCOSE, CAPILLARY: Glucose-Capillary: 335 mg/dL — ABNORMAL HIGH (ref 70–99)

## 2012-09-04 MED ORDER — IBUPROFEN 200 MG PO TABS
400.0000 mg | ORAL_TABLET | Freq: Once | ORAL | Status: AC
  Administered 2012-09-04: 400 mg via ORAL
  Filled 2012-09-04: qty 2

## 2012-09-04 NOTE — ED Notes (Signed)
The pt reports that his blood sugar is high .  He was here last pm for the same and he thought he needed to be admitted to the hosp but they did not.

## 2012-09-05 ENCOUNTER — Encounter (HOSPITAL_COMMUNITY): Payer: Self-pay | Admitting: Family Medicine

## 2012-09-05 ENCOUNTER — Emergency Department (HOSPITAL_COMMUNITY)
Admission: EM | Admit: 2012-09-05 | Discharge: 2012-09-06 | Disposition: A | Discharge status: Home / Self Care | Payer: Medicare Other | Attending: Emergency Medicine | Admitting: Emergency Medicine

## 2012-09-05 ENCOUNTER — Emergency Department (HOSPITAL_COMMUNITY)
Admission: EM | Admit: 2012-09-05 | Discharge: 2012-09-05 | Disposition: A | Discharge status: Home / Self Care | Payer: Medicaid Other | Attending: Emergency Medicine | Admitting: Emergency Medicine

## 2012-09-05 DIAGNOSIS — E1169 Type 2 diabetes mellitus with other specified complication: Secondary | ICD-10-CM | POA: Insufficient documentation

## 2012-09-05 DIAGNOSIS — R739 Hyperglycemia, unspecified: Secondary | ICD-10-CM

## 2012-09-05 DIAGNOSIS — E1165 Type 2 diabetes mellitus with hyperglycemia: Secondary | ICD-10-CM | POA: Insufficient documentation

## 2012-09-05 DIAGNOSIS — Z91199 Patient's noncompliance with other medical treatment and regimen due to unspecified reason: Secondary | ICD-10-CM | POA: Insufficient documentation

## 2012-09-05 DIAGNOSIS — G8929 Other chronic pain: Secondary | ICD-10-CM | POA: Insufficient documentation

## 2012-09-05 DIAGNOSIS — Z7982 Long term (current) use of aspirin: Secondary | ICD-10-CM | POA: Insufficient documentation

## 2012-09-05 DIAGNOSIS — IMO0002 Reserved for concepts with insufficient information to code with codable children: Secondary | ICD-10-CM | POA: Insufficient documentation

## 2012-09-05 DIAGNOSIS — Z8619 Personal history of other infectious and parasitic diseases: Secondary | ICD-10-CM | POA: Insufficient documentation

## 2012-09-05 DIAGNOSIS — M25562 Pain in left knee: Secondary | ICD-10-CM

## 2012-09-05 DIAGNOSIS — I10 Essential (primary) hypertension: Secondary | ICD-10-CM | POA: Insufficient documentation

## 2012-09-05 DIAGNOSIS — Z794 Long term (current) use of insulin: Secondary | ICD-10-CM | POA: Insufficient documentation

## 2012-09-05 DIAGNOSIS — B20 Human immunodeficiency virus [HIV] disease: Secondary | ICD-10-CM | POA: Insufficient documentation

## 2012-09-05 DIAGNOSIS — R42 Dizziness and giddiness: Secondary | ICD-10-CM | POA: Insufficient documentation

## 2012-09-05 DIAGNOSIS — Z8739 Personal history of other diseases of the musculoskeletal system and connective tissue: Secondary | ICD-10-CM | POA: Insufficient documentation

## 2012-09-05 DIAGNOSIS — Z9119 Patient's noncompliance with other medical treatment and regimen: Secondary | ICD-10-CM | POA: Insufficient documentation

## 2012-09-05 DIAGNOSIS — Z79899 Other long term (current) drug therapy: Secondary | ICD-10-CM | POA: Insufficient documentation

## 2012-09-05 LAB — GLUCOSE, CAPILLARY
Glucose-Capillary: 411 mg/dL — ABNORMAL HIGH (ref 70–99)
Glucose-Capillary: 373 mg/dL — ABNORMAL HIGH (ref 70–99)
Glucose-Capillary: 387 mg/dL — ABNORMAL HIGH (ref 70–99)

## 2012-09-05 MED ORDER — TRAMADOL HCL 50 MG PO TABS
50.0000 mg | ORAL_TABLET | Freq: Once | ORAL | Status: DC
  Filled 2012-09-05: qty 1

## 2012-09-05 MED ORDER — INSULIN ASPART 100 UNIT/ML ~~LOC~~ SOLN
5.0000 [IU] | Freq: Once | SUBCUTANEOUS | Status: AC
  Administered 2012-09-05: 5 [IU] via SUBCUTANEOUS
  Filled 2012-09-05: qty 1

## 2012-09-05 MED ORDER — SODIUM CHLORIDE 0.9 % IV BOLUS (SEPSIS)
2000.0000 mL | Freq: Once | INTRAVENOUS | Status: AC
  Administered 2012-09-05: 2000 mL via INTRAVENOUS

## 2012-09-05 NOTE — ED Notes (Signed)
Pt. Here for high blood sugar. Here last night for same. States "I told them they needed to admit me but they didn't. I get real weak when my blood sugar is high and I fell and hit my leg".

## 2012-09-05 NOTE — ED Provider Notes (Signed)
Medical screening examination/treatment/procedure(s) were performed by non-physician practitioner and as supervising physician I was immediately available for consultation/collaboration.  Delora Fuel, MD [id number] [phone]

## 2012-09-05 NOTE — ED Notes (Addendum)
Patient states "I got real bad knees and my sugar is high. I think I need to be admitted." Patient states he has not checked his sugar but states his vision is blurry and that he has fallen twice. Blood sugar 387 at triage. Patient also reports urinary frequency and increased thirst.

## 2012-09-05 NOTE — ED Notes (Signed)
CBG 373 

## 2012-09-05 NOTE — ED Provider Notes (Signed)
History     CSN: [id number]  Arrival date & time 09/04/12  2215   First MD Initiated Contact with Patient 09/05/12 0022      Chief Complaint  Patient presents with  . Hyperglycemia    (Consider location/radiation/quality/duration/timing/severity/associated sxs/prior treatment) HPI  Keith [name] is a 48 yo man with multiple chronic medical problems including AIDS and poorly controlled T2 IDDM who presents with complaints of hyperglycemia and associated symptoms and left knee pain. The patient says "my knee just gave out - see I got bad knees".  However, about a minute before this statement, he said he stepped in a hole with his left leg and hurt his left knee.   Patient says he has had polyuria and polydypsia as well as generalized weakness. He says he had a dose of Lantus this morning - 50 units. He usually takes this qhs but, was here in the ED last night with similar sx and said that he did not take his insulin while he was in the ED.   Patient reports compliance with all meds and ADA diet.   Patient denies any recent med changes. Patient takes Lantus 50 units qhs but no SSI. He also takes metformin 1000mg  bid and Glipizide 5mg  qd.   Patient says he wants to be admitted to the hospital because he is sick of feeling bed. He also asked for something for pain for his left knee pain.   Past Medical History  Diagnosis Date  . HIV (human immunodeficiency virus infection) 2009    CD4 count 100, VL 13800 (05/01/2010)  . Diabetes type 2, uncontrolled     HgA1c 17.6 (04/27/2010)  . Hypertension   . Genital warts   . Erectile dysfunction   . Chronic knee pain     right  . Osteomyelitis     h/o hand  . Diabetes mellitus   . Chronic pain   . AIDS     History reviewed. No pertinent past surgical history.  Family History  Problem Relation Age of Onset  . Hypertension Mother   . Arthritis Father   . Hypertension Father   . Hypertension Brother   . Cancer Maternal Grandmother 42   unknown type of cancer  . Depression Paternal Grandmother     History  Substance Use Topics  . Smoking status: Never Smoker   . Smokeless tobacco: Never Used  . Alcohol Use: No      Review of Systems Gen: no weight loss, fevers, chills, night sweats Eyes: no discharge or drainage, no occular pain or visual changes Nose: no epistaxis or rhinorrhea Mouth: no dental pain, no sore throat Neck: no neck pain Lungs: no SOB, cough, wheezing CV: no chest pain, palpitations, dependent edema or orthopnea Abd: no abdominal pain, nausea, vomiting GU: no dysuria or gross hematuria MSK: As per history of present illness, otherwise negative Neuro: no headache, no focal neurologic deficits Skin: no rash Psyche: negative. Endocrine: As per history of present illness, otherwise negative Allergies  Sulfa antibiotics  Home Medications   Current Outpatient Rx  Name  Route  Sig  Dispense  Refill  . aspirin 81 MG EC tablet   Oral   Take 81 mg by mouth every morning.         Marland Kitchen atenolol (TENORMIN) 50 MG tablet   Oral   Take 50 mg by mouth every morning.         . dapsone 100 MG tablet   Oral   Take 100  mg by mouth every morning.         . Darunavir Ethanolate (PREZISTA) 800 MG tablet   Oral   Take 1 tablet (800 mg total) by mouth daily with breakfast.   30 tablet   5   . emtricitabine-tenofovir (TRUVADA) 200-300 MG per tablet   Oral   Take 1 tablet by mouth daily.   30 tablet   5   . glipiZIDE (GLUCOTROL XL) 5 MG 24 hr tablet   Oral   Take 1 tablet (5 mg total) by mouth daily.   30 tablet   1   . insulin glargine (LANTUS) 100 UNIT/ML injection   Subcutaneous   Inject 50 Units into the skin at bedtime.         Marland Kitchen lisinopril (PRINIVIL,ZESTRIL) 40 MG tablet   Oral   Take 1 tablet (40 mg total) by mouth daily.   30 tablet   5   . metFORMIN (GLUCOPHAGE) 500 MG tablet   Oral   Take 500 mg by mouth 2 (two) times daily with a meal.         .  oxyCODONE-acetaminophen (PERCOCET/ROXICET) 5-325 MG per tablet   Oral   Take 1 tablet by mouth every 6 (six) hours as needed for pain.   15 tablet   0   . raltegravir (ISENTRESS) 400 MG tablet   Oral   Take 1 tablet (400 mg total) by mouth 2 (two) times daily.   60 tablet   5   . ritonavir (NORVIR) 100 MG TABS   Oral   Take 1 tablet (100 mg total) by mouth daily with breakfast.   30 tablet   5   . testosterone (ANDROGEL) 50 MG/5GM GEL   Transdermal   Place 5 g onto the skin daily.            BP 162/98  Pulse 80  Temp(Src) 98.1 F (36.7 C) (Oral)  Resp 16  SpO2 100%  Physical Exam Gen: well developed and well nourished appearing Head: NCAT Eyes: PERL, EOMI Nose: no epistaixis or rhinorrhea Mouth/throat: mucosa is moist and pink Neck: supple, no stridor Lungs: CTA B, no wheezing, rhonchi or rales Heart: Regular rate and rhythm, no murmur, extremities appear well perfused Abd: soft, notender, nondistended Back: no ttp, no cva ttp Skin: no rashese, wnl Neuro: CN ii-xii grossly intact, no focal deficits Extremities: To inspection, no deformities, no tenderness to palpation, full range of motion without pain at both hips and knees and ankles. Psyche; normal affect,  calm and cooperative.   ED Course  Procedures (including critical care time)    Dg Knee Complete 4 Views Right  09/03/2012   *RADIOLOGY REPORT*  Clinical Data: History of injury from fall with painful right knee.  RIGHT KNEE - COMPLETE 4+ VIEW  Comparison: 07/28/2012.  Findings: No definite joint effusion is seen. Alignment is normal.  Joint spaces are preserved.  No fracture or dislocation is evident.  No soft tissue lesions are seen.  IMPRESSION: No fracture or dislocation is evident.   Original Report Authenticated By: Shanon Brow Call   Results for orders placed during the hospital encounter of 09/05/12 (from the past 24 hour(s))  COMPREHENSIVE METABOLIC PANEL     Status: Abnormal   Collection Time     09/04/12 10:24 PM      Result Value Range   Sodium 134 (*) 135 - 145 mEq/L   Potassium 4.4  3.5 - 5.1 mEq/L   Chloride 96  96 -  112 mEq/L   CO2 26  19 - 32 mEq/L   Glucose, Bld 477 (*) 70 - 99 mg/dL   BUN 8  6 - 23 mg/dL   Creatinine, Ser 0.66  0.50 - 1.35 mg/dL   Calcium 9.5  8.4 - 10.5 mg/dL   Total Protein 8.5 (*) 6.0 - 8.3 g/dL   Albumin 4.0  3.5 - 5.2 g/dL   AST 23  0 - 37 U/L   ALT 14  0 - 53 U/L   Alkaline Phosphatase 80  39 - 117 U/L   Total Bilirubin 0.4  0.3 - 1.2 mg/dL   GFR calc non Af Amer >90  >90 mL/min   GFR calc Af Amer >90  >90 mL/min  GLUCOSE, CAPILLARY     Status: Abnormal   Collection Time    09/05/12  1:10 AM      Result Value Range   Glucose-Capillary 411 (*) 70 - 99 mg/dL  GLUCOSE, CAPILLARY     Status: Abnormal   Collection Time    09/05/12  2:40 AM      Result Value Range   Glucose-Capillary 373 (*) 70 - 99 mg/dL        MDM  Keith [name] and is known to be a poorly compliant insulin-dependent diabetic. He has nonketotic hyperglycemia in the emergency department today. His renal function is normal. We have treated with 2 L of normal saline IV as well as subcutaneous NovoLog. The patient says that he has Lantus. I have counseled him on the importance of strict adherence to his medication regimen including oral glycemic. He does not appear to have any acute knee injury. He is requesting prescription for opiate medication and I have explained to him that we have a chronic pain policy in our emergency department and that he will not be prescribing opiate for his chronic knee pain. The patient is stable for discharge. He agrees to followup with his primary care provider.        Elyn Peers, MD 09/05/12 [phone]

## 2012-09-06 LAB — URINALYSIS, ROUTINE W REFLEX MICROSCOPIC
Bilirubin Urine: NEGATIVE
Glucose, UA: >1000 mg/dL — AB
Hgb urine dipstick: NEGATIVE
Ketones, ur: NEGATIVE mg/dL
Leukocytes, UA: NEGATIVE
Nitrite: NEGATIVE
Protein, ur: NEGATIVE mg/dL
Specific Gravity, Urine: 1.039 — ABNORMAL HIGH (ref 1.005–1.030)
Urobilinogen, UA: 1 mg/dL (ref 0.0–1.0)
pH: 5.5 (ref 5.0–8.0)

## 2012-09-06 LAB — CBC WITH DIFFERENTIAL/PLATELET
Basophils Absolute: 0 10*3/uL (ref 0.0–0.1)
Basophils Relative: 0 % (ref 0–1)
Eosinophils Absolute: 0.1 10*3/uL (ref 0.0–0.7)
Eosinophils Relative: 2 % (ref 0–5)
HCT: 35.4 % — ABNORMAL LOW (ref 39.0–52.0)
Hemoglobin: 11.9 g/dL — ABNORMAL LOW (ref 13.0–17.0)
Lymphocytes Relative: 39 % (ref 12–46)
Lymphs Abs: 2 10*3/uL (ref 0.7–4.0)
MCH: 26.1 pg (ref 26.0–34.0)
MCHC: 33.6 g/dL (ref 30.0–36.0)
MCV: 77.6 fL — ABNORMAL LOW (ref 78.0–100.0)
Monocytes Absolute: 0.5 10*3/uL (ref 0.1–1.0)
Monocytes Relative: 9 % (ref 3–12)
Neutro Abs: 2.6 10*3/uL (ref 1.7–7.7)
Neutrophils Relative %: 50 % (ref 43–77)
Platelets: 183 10*3/uL (ref 150–400)
RBC: 4.56 MIL/uL (ref 4.22–5.81)
RDW: 13.1 % (ref 11.5–15.5)
WBC: 5.1 10*3/uL (ref 4.0–10.5)

## 2012-09-06 LAB — BASIC METABOLIC PANEL
BUN: 5 mg/dL — ABNORMAL LOW (ref 6–23)
CO2: 27 mEq/L (ref 19–32)
Calcium: 9.6 mg/dL (ref 8.4–10.5)
Chloride: 95 mEq/L — ABNORMAL LOW (ref 96–112)
Creatinine, Ser: 0.62 mg/dL (ref 0.50–1.35)
GFR calc Af Amer: >90 mL/min (ref >90)
GFR calc non Af Amer: >90 mL/min (ref >90)
Glucose, Bld: 364 mg/dL — ABNORMAL HIGH (ref 70–99)
Potassium: 3.5 mEq/L (ref 3.5–5.1)
Sodium: 131 mEq/L — ABNORMAL LOW (ref 135–145)

## 2012-09-06 LAB — URINE MICROSCOPIC-ADD ON: Urine-Other: NONE SEEN

## 2012-09-06 MED ORDER — INSULIN ASPART 100 UNIT/ML ~~LOC~~ SOLN
15.0000 [IU] | Freq: Once | SUBCUTANEOUS | Status: AC
  Administered 2012-09-06: 15 [IU] via SUBCUTANEOUS
  Filled 2012-09-06 (×2): qty 1

## 2012-09-06 NOTE — ED Provider Notes (Signed)
History     CSN: [id number]  Arrival date & time 09/05/12  2125   First MD Initiated Contact with Patient 09/06/12 0124      Chief Complaint  Patient presents with  . Knee Pain  . Hyperglycemia    (Consider location/radiation/quality/duration/timing/severity/associated sxs/prior treatment) HPI 47 year old male presents to emergency room with complaint of bilateral knee pain and elevated sugars.  Patient with frequent visits to the emergency department for similar complaints.  He is followed by outpatient clinics.  He is a diabetic, and has chronic knee pain.  He also has history of HIV.  He has a history of noncompliance with medications.  He admits to me that he has not been checking his sugars.  But he feels that they're high due to blurred vision, and dizziness.  Patient is requesting admission.  Of note, patient was seen this morning for same complaint, and the day prior to that.  He was told that time that due to the chronic nature of his knee pain, no pain medications would be given to the emergency department.  Patient reports he is taking his Lantus as prescribed.  He is not on sliding scale insulin. Past Medical History  Diagnosis Date  . HIV (human immunodeficiency virus infection) 2009    CD4 count 100, VL 13800 (05/01/2010)  . Diabetes type 2, uncontrolled     HgA1c 17.6 (04/27/2010)  . Hypertension   . Genital warts   . Erectile dysfunction   . Chronic knee pain     right  . Osteomyelitis     h/o hand  . Diabetes mellitus   . Chronic pain   . AIDS     History reviewed. No pertinent past surgical history.  Family History  Problem Relation Age of Onset  . Hypertension Mother   . Arthritis Father   . Hypertension Father   . Hypertension Brother   . Cancer Maternal Grandmother 57    unknown type of cancer  . Depression Paternal Grandmother     History  Substance Use Topics  . Smoking status: Never Smoker   . Smokeless tobacco: Never Used  . Alcohol Use:  No      Review of Systems  All other systems reviewed and are negative.    Allergies  Sulfa antibiotics  Home Medications   Current Outpatient Rx  Name  Route  Sig  Dispense  Refill  . aspirin 81 MG EC tablet   Oral   Take 81 mg by mouth every morning.         Marland Kitchen atenolol (TENORMIN) 50 MG tablet   Oral   Take 50 mg by mouth every morning.         . dapsone 100 MG tablet   Oral   Take 100 mg by mouth every morning.         . Darunavir Ethanolate (PREZISTA) 800 MG tablet   Oral   Take 1 tablet (800 mg total) by mouth daily with breakfast.   30 tablet   5   . emtricitabine-tenofovir (TRUVADA) 200-300 MG per tablet   Oral   Take 1 tablet by mouth every morning.         Marland Kitchen glipiZIDE (GLUCOTROL XL) 5 MG 24 hr tablet   Oral   Take 5 mg by mouth every morning.         . insulin glargine (LANTUS) 100 UNIT/ML injection   Subcutaneous   Inject 50 Units into the skin at bedtime.         Marland Kitchen  lisinopril (PRINIVIL,ZESTRIL) 40 MG tablet   Oral   Take 40 mg by mouth every morning.         . metFORMIN (GLUCOPHAGE) 500 MG tablet   Oral   Take 500 mg by mouth 2 (two) times daily with a meal.         . raltegravir (ISENTRESS) 400 MG tablet   Oral   Take 1 tablet (400 mg total) by mouth 2 (two) times daily.   60 tablet   5   . ritonavir (NORVIR) 100 MG TABS   Oral   Take 1 tablet (100 mg total) by mouth daily with breakfast.   30 tablet   5   . testosterone (ANDROGEL) 50 MG/5GM GEL   Transdermal   Place 5 g onto the skin every morning.            BP 141/102  Pulse 76  Temp(Src) 98.6 F (37 C)  Resp 20  Ht 5\' 9"  (1.753 m)  Wt 250 lb (113.399 kg)  BMI 36.9 kg/m2  SpO2 99%  Physical Exam  Nursing note and vitals reviewed. Constitutional: He is oriented to person, place, and time. He appears well-developed and well-nourished. No distress.  Patient is sleeping when I entered the room, and per nursing staff has been sleeping since he entered the  room  HENT:  Head: Normocephalic and atraumatic.  Nose: Nose normal.  Mouth/Throat: Oropharynx is clear and moist.  Eyes: Conjunctivae and EOM are normal. Pupils are equal, round, and reactive to light.  Neck: Normal range of motion. Neck supple. No JVD present. No tracheal deviation present. No thyromegaly present.  Cardiovascular: Normal rate, regular rhythm, normal heart sounds and intact distal pulses.  Exam reveals no gallop and no friction rub.   No murmur heard. Pulmonary/Chest: Effort normal and breath sounds normal. No stridor. No respiratory distress. He has no wheezes. He has no rales. He exhibits no tenderness.  Abdominal: Soft. Bowel sounds are normal. He exhibits no distension and no mass. There is no tenderness. There is no rebound and no guarding.  Musculoskeletal: Normal range of motion. He exhibits tenderness (patient reports tenderness to palpation of bilateral knees). He exhibits no edema.  Lymphadenopathy:    He has no cervical adenopathy.  Neurological: He is alert and oriented to person, place, and time. He exhibits normal muscle tone. Coordination normal.  Skin: Skin is warm and dry. No rash noted. No erythema. No pallor.  Psychiatric: He has a normal mood and affect. His behavior is normal. Judgment and thought content normal.    ED Course  Procedures (including critical care time)  Labs Reviewed  GLUCOSE, CAPILLARY - Abnormal; Notable for the following:    Glucose-Capillary 387 (*)    All other components within normal limits  BASIC METABOLIC PANEL - Abnormal; Notable for the following:    Sodium 131 (*)    Chloride 95 (*)    Glucose, Bld 364 (*)    BUN 5 (*)    All other components within normal limits  CBC WITH DIFFERENTIAL - Abnormal; Notable for the following:    Hemoglobin 11.9 (*)    HCT 35.4 (*)    MCV 77.6 (*)    All other components within normal limits  URINALYSIS, ROUTINE W REFLEX MICROSCOPIC - Abnormal; Notable for the following:    Specific  Gravity, Urine 1.039 (*)    Glucose, UA >1000 (*)    All other components within normal limits  URINE MICROSCOPIC-ADD ON  No results found.   1. Hyperglycemia   2. Chronic pain       MDM  Keith [name] is well-known to the emergency department for her chronic knee pain and noncompliance with his diabetes regimen.  I have explained to him that we cannot treat his chronic pain in the emergency department.  We will plan to give him subcutaneous insulin and let him followup with his primary care doctors.  He reports that he does have Lantus at home.  There are no signs of acidosis or ketosis.        Kalman Drape, MD 09/06/12 [phone]

## 2012-09-07 LAB — GLUCOSE, CAPILLARY: Glucose-Capillary: 416 mg/dL — ABNORMAL HIGH (ref 70–99)

## 2012-09-09 ENCOUNTER — Encounter (HOSPITAL_COMMUNITY): Payer: Self-pay | Admitting: Emergency Medicine

## 2012-09-09 ENCOUNTER — Emergency Department (HOSPITAL_COMMUNITY)
Admission: EM | Admit: 2012-09-09 | Discharge: 2012-09-10 | Disposition: A | Discharge status: Home / Self Care | Payer: Medicaid Other | Attending: Emergency Medicine | Admitting: Emergency Medicine

## 2012-09-09 DIAGNOSIS — M25569 Pain in unspecified knee: Secondary | ICD-10-CM | POA: Insufficient documentation

## 2012-09-09 DIAGNOSIS — M869 Osteomyelitis, unspecified: Secondary | ICD-10-CM | POA: Insufficient documentation

## 2012-09-09 DIAGNOSIS — IMO0002 Reserved for concepts with insufficient information to code with codable children: Secondary | ICD-10-CM | POA: Insufficient documentation

## 2012-09-09 DIAGNOSIS — E1169 Type 2 diabetes mellitus with other specified complication: Secondary | ICD-10-CM | POA: Insufficient documentation

## 2012-09-09 DIAGNOSIS — Z794 Long term (current) use of insulin: Secondary | ICD-10-CM | POA: Insufficient documentation

## 2012-09-09 DIAGNOSIS — Y929 Unspecified place or not applicable: Secondary | ICD-10-CM | POA: Insufficient documentation

## 2012-09-09 DIAGNOSIS — H538 Other visual disturbances: Secondary | ICD-10-CM | POA: Insufficient documentation

## 2012-09-09 DIAGNOSIS — S8990XA Unspecified injury of unspecified lower leg, initial encounter: Secondary | ICD-10-CM | POA: Insufficient documentation

## 2012-09-09 DIAGNOSIS — R110 Nausea: Secondary | ICD-10-CM | POA: Insufficient documentation

## 2012-09-09 DIAGNOSIS — Z87448 Personal history of other diseases of urinary system: Secondary | ICD-10-CM | POA: Insufficient documentation

## 2012-09-09 DIAGNOSIS — Z8619 Personal history of other infectious and parasitic diseases: Secondary | ICD-10-CM | POA: Insufficient documentation

## 2012-09-09 DIAGNOSIS — Y9389 Activity, other specified: Secondary | ICD-10-CM | POA: Insufficient documentation

## 2012-09-09 DIAGNOSIS — Z7982 Long term (current) use of aspirin: Secondary | ICD-10-CM | POA: Insufficient documentation

## 2012-09-09 DIAGNOSIS — Z79899 Other long term (current) drug therapy: Secondary | ICD-10-CM | POA: Insufficient documentation

## 2012-09-09 DIAGNOSIS — W19XXXA Unspecified fall, initial encounter: Secondary | ICD-10-CM | POA: Insufficient documentation

## 2012-09-09 DIAGNOSIS — E1165 Type 2 diabetes mellitus with hyperglycemia: Secondary | ICD-10-CM | POA: Insufficient documentation

## 2012-09-09 DIAGNOSIS — Z21 Asymptomatic human immunodeficiency virus [HIV] infection status: Secondary | ICD-10-CM | POA: Insufficient documentation

## 2012-09-09 DIAGNOSIS — I10 Essential (primary) hypertension: Secondary | ICD-10-CM | POA: Insufficient documentation

## 2012-09-09 DIAGNOSIS — G8929 Other chronic pain: Secondary | ICD-10-CM

## 2012-09-09 DIAGNOSIS — R739 Hyperglycemia, unspecified: Secondary | ICD-10-CM

## 2012-09-09 LAB — COMPREHENSIVE METABOLIC PANEL
ALT: 12 U/L (ref 0–53)
AST: 19 U/L (ref 0–37)
Albumin: 3.7 g/dL (ref 3.5–5.2)
Alkaline Phosphatase: 84 U/L (ref 39–117)
BUN: 8 mg/dL (ref 6–23)
CO2: 27 mEq/L (ref 19–32)
Calcium: 9.7 mg/dL (ref 8.4–10.5)
Chloride: 98 mEq/L (ref 96–112)
Creatinine, Ser: 0.63 mg/dL (ref 0.50–1.35)
GFR calc Af Amer: >90 mL/min (ref >90)
GFR calc non Af Amer: >90 mL/min (ref >90)
Glucose, Bld: 371 mg/dL — ABNORMAL HIGH (ref 70–99)
Potassium: 3.6 mEq/L (ref 3.5–5.1)
Sodium: 135 mEq/L (ref 135–145)
Total Bilirubin: 0.3 mg/dL (ref 0.3–1.2)
Total Protein: 7.9 g/dL (ref 6.0–8.3)

## 2012-09-09 LAB — CBC
HCT: 34.9 % — ABNORMAL LOW (ref 39.0–52.0)
Hemoglobin: 12.2 g/dL — ABNORMAL LOW (ref 13.0–17.0)
MCH: 27.4 pg (ref 26.0–34.0)
MCHC: 35 g/dL (ref 30.0–36.0)
MCV: 78.3 fL (ref 78.0–100.0)
Platelets: 218 10*3/uL (ref 150–400)
RBC: 4.46 MIL/uL (ref 4.22–5.81)
RDW: 13.2 % (ref 11.5–15.5)
WBC: 5.8 10*3/uL (ref 4.0–10.5)

## 2012-09-09 LAB — URINALYSIS, ROUTINE W REFLEX MICROSCOPIC
Bilirubin Urine: NEGATIVE
Glucose, UA: >1000 mg/dL — AB
Hgb urine dipstick: NEGATIVE
Ketones, ur: NEGATIVE mg/dL
Leukocytes, UA: NEGATIVE
Nitrite: NEGATIVE
Protein, ur: 30 mg/dL — AB
Specific Gravity, Urine: >1.03 — ABNORMAL HIGH (ref 1.005–1.030)
Urobilinogen, UA: 1 mg/dL (ref 0.0–1.0)
pH: 5.5 (ref 5.0–8.0)

## 2012-09-09 LAB — URINE MICROSCOPIC-ADD ON

## 2012-09-09 LAB — GLUCOSE, CAPILLARY: Glucose-Capillary: 361 mg/dL — ABNORMAL HIGH (ref 70–99)

## 2012-09-09 NOTE — ED Notes (Signed)
Pt reports blood sugar elevated all day today.  Has not taken any insulin today- states he takes it at night.  Pt requesting food and diet soda. Informed him he would have to see EDP first.

## 2012-09-09 NOTE — ED Notes (Signed)
Pts CBG 361

## 2012-09-09 NOTE — ED Notes (Signed)
Pt states that his blood sugar is elevated and that he has not taken any insulin.

## 2012-09-09 NOTE — ED Notes (Signed)
Pt requesting not to have labs drawn until IV started.

## 2012-09-09 NOTE — ED Provider Notes (Signed)
History     CSN: [id number]  Arrival date & time 09/09/12  2048   First MD Initiated Contact with Patient 09/09/12 2210      Chief Complaint  Patient presents with  . Hyperglycemia    (Consider location/radiation/quality/duration/timing/severity/associated sxs/prior treatment) Patient is a 47 y.o. male presenting with hyperglycemia.  Hyperglycemia  Keith [name] is a 47 y.o. male w a hx of DM, HIV, HTN & chronic knee pain presents to the ER c/o high BG & right knee pain. Pt is well known to the ER for similar presentations and has been told in past that he will not receive pain medication for his chronic knee pain. Pt states he is taking his "sugar medication" as he is prescribed. He checked his glucose this morning and it was 190. Pt later developed nausea and blurred vision. At some point today he reports having a mechanical fall w impact of right knee. Of note pt was seen 4 days ago for similar complaint requesting admission.   Past Medical History  Diagnosis Date  . HIV (human immunodeficiency virus infection) 2009    CD4 count 100, VL 13800 (05/01/2010)  . Diabetes type 2, uncontrolled     HgA1c 17.6 (04/27/2010)  . Hypertension   . Genital warts   . Erectile dysfunction   . Chronic knee pain     right  . Osteomyelitis     h/o hand  . Diabetes mellitus   . Chronic pain   . AIDS     History reviewed. No pertinent past surgical history.  Family History  Problem Relation Age of Onset  . Hypertension Mother   . Arthritis Father   . Hypertension Father   . Hypertension Brother   . Cancer Maternal Grandmother 59    unknown type of cancer  . Depression Paternal Grandmother     History  Substance Use Topics  . Smoking status: Never Smoker   . Smokeless tobacco: Never Used  . Alcohol Use: No      Review of Systems  All other systems reviewed and are negative.    Allergies  Sulfa antibiotics  Home Medications   Current Outpatient Rx  Name  Route  Sig   Dispense  Refill  . aspirin 81 MG EC tablet   Oral   Take 81 mg by mouth every morning.         Marland Kitchen atenolol (TENORMIN) 50 MG tablet   Oral   Take 50 mg by mouth every morning.         . dapsone 100 MG tablet   Oral   Take 100 mg by mouth every morning.         . Darunavir Ethanolate (PREZISTA) 800 MG tablet   Oral   Take 1 tablet (800 mg total) by mouth daily with breakfast.   30 tablet   5   . emtricitabine-tenofovir (TRUVADA) 200-300 MG per tablet   Oral   Take 1 tablet by mouth every morning.         Marland Kitchen glipiZIDE (GLUCOTROL XL) 5 MG 24 hr tablet   Oral   Take 5 mg by mouth every morning.         . insulin glargine (LANTUS) 100 UNIT/ML injection   Subcutaneous   Inject 50 Units into the skin at bedtime.         Marland Kitchen lisinopril (PRINIVIL,ZESTRIL) 40 MG tablet   Oral   Take 40 mg by mouth every morning.         Marland Kitchen  metFORMIN (GLUCOPHAGE) 500 MG tablet   Oral   Take 500 mg by mouth 2 (two) times daily with a meal.         . raltegravir (ISENTRESS) 400 MG tablet   Oral   Take 1 tablet (400 mg total) by mouth 2 (two) times daily.   60 tablet   5   . ritonavir (NORVIR) 100 MG TABS   Oral   Take 1 tablet (100 mg total) by mouth daily with breakfast.   30 tablet   5   . testosterone (ANDROGEL) 50 MG/5GM GEL   Transdermal   Place 5 g onto the skin every morning.            BP 135/85  Pulse 97  Temp(Src) 98.1 F (36.7 C) (Oral)  Resp 16  SpO2 96%  Physical Exam  Nursing note and vitals reviewed. Constitutional: He is oriented to person, place, and time. He appears well-developed and well-nourished. No distress.  Non ketotic breath  HENT:  Head: Normocephalic and atraumatic.  Eyes: Conjunctivae and EOM are normal.  Neck: Normal range of motion.  Cardiovascular:  RRR, intact distal pulses  Pulmonary/Chest: Effort normal.  LCAB   Abdominal:  Soft obese non tender abdomen   Musculoskeletal: Normal range of motion.  Right knee ttp over  patella w painful ROM. No laxity palpated. All other joints w nl ROM  Neurological: He is alert and oriented to person, place, and time.  Skin: Skin is warm and dry. No rash noted. He is not diaphoretic.  Psychiatric: He has a normal mood and affect. His behavior is normal.    ED Course  Procedures (including critical care time)  Labs Reviewed  GLUCOSE, CAPILLARY - Abnormal; Notable for the following:    Glucose-Capillary 361 (*)    All other components within normal limits  CBC - Abnormal; Notable for the following:    Hemoglobin 12.2 (*)    HCT 34.9 (*)    All other components within normal limits  COMPREHENSIVE METABOLIC PANEL - Abnormal; Notable for the following:    Glucose, Bld 371 (*)    All other components within normal limits  URINALYSIS, ROUTINE W REFLEX MICROSCOPIC - Abnormal; Notable for the following:    Specific Gravity, Urine >1.030 (*)    Glucose, UA >1000 (*)    Protein, ur 30 (*)    All other components within normal limits  URINE MICROSCOPIC-ADD ON - Abnormal; Notable for the following:    Casts HYALINE CASTS (*)    All other components within normal limits   No results found.   No diagnosis found.    MDM  Hyperglycemia  Patient with high blood glucose without ketosis.  Multiple visits to the emergency department for similar.  Patient states that he does have his diabetic medication.  Advised to continue taking medications as prescribed and followup with primary care physician.  Pain medication not given for chronic knee pain which was explained to patient who verbalizes understanding.  At this time there does not appear to be any evidence of an acute emergency medical condition and the patient appears stable for discharge with appropriate outpatient follow up.Diagnosis was discussed with patient who verbalizes understanding and is agreeable to discharge.      Verl Dicker, [place] 09/10/12 0020

## 2012-09-11 NOTE — ED Provider Notes (Signed)
Medical screening examination/treatment/procedure(s) were performed by non-physician practitioner and as supervising physician I was immediately available for consultation/collaboration.   Alfonzo Feller, DO 09/11/12 1433

## 2012-09-21 ENCOUNTER — Other Ambulatory Visit: Payer: Self-pay | Admitting: Licensed Clinical Social Worker

## 2012-09-21 ENCOUNTER — Encounter (HOSPITAL_COMMUNITY): Payer: Self-pay | Admitting: Emergency Medicine

## 2012-09-21 ENCOUNTER — Emergency Department (HOSPITAL_COMMUNITY)
Admission: EM | Admit: 2012-09-21 | Discharge: 2012-09-21 | Disposition: A | Discharge status: Home / Self Care | Payer: Medicaid Other | Attending: Emergency Medicine | Admitting: Emergency Medicine

## 2012-09-21 DIAGNOSIS — I10 Essential (primary) hypertension: Secondary | ICD-10-CM | POA: Insufficient documentation

## 2012-09-21 DIAGNOSIS — E1110 Type 2 diabetes mellitus with ketoacidosis without coma: Secondary | ICD-10-CM

## 2012-09-21 DIAGNOSIS — R5381 Other malaise: Secondary | ICD-10-CM | POA: Insufficient documentation

## 2012-09-21 DIAGNOSIS — Z794 Long term (current) use of insulin: Secondary | ICD-10-CM | POA: Insufficient documentation

## 2012-09-21 DIAGNOSIS — G8929 Other chronic pain: Secondary | ICD-10-CM | POA: Insufficient documentation

## 2012-09-21 DIAGNOSIS — Z79899 Other long term (current) drug therapy: Secondary | ICD-10-CM | POA: Insufficient documentation

## 2012-09-21 DIAGNOSIS — Z87448 Personal history of other diseases of urinary system: Secondary | ICD-10-CM | POA: Insufficient documentation

## 2012-09-21 DIAGNOSIS — R739 Hyperglycemia, unspecified: Secondary | ICD-10-CM

## 2012-09-21 DIAGNOSIS — E1169 Type 2 diabetes mellitus with other specified complication: Secondary | ICD-10-CM | POA: Insufficient documentation

## 2012-09-21 DIAGNOSIS — B20 Human immunodeficiency virus [HIV] disease: Secondary | ICD-10-CM | POA: Insufficient documentation

## 2012-09-21 DIAGNOSIS — Z8619 Personal history of other infectious and parasitic diseases: Secondary | ICD-10-CM | POA: Insufficient documentation

## 2012-09-21 DIAGNOSIS — M25569 Pain in unspecified knee: Secondary | ICD-10-CM | POA: Insufficient documentation

## 2012-09-21 DIAGNOSIS — Z7982 Long term (current) use of aspirin: Secondary | ICD-10-CM | POA: Insufficient documentation

## 2012-09-21 DIAGNOSIS — Z8739 Personal history of other diseases of the musculoskeletal system and connective tissue: Secondary | ICD-10-CM | POA: Insufficient documentation

## 2012-09-21 LAB — CBC WITH DIFFERENTIAL/PLATELET
Basophils Absolute: 0 10*3/uL (ref 0.0–0.1)
Basophils Relative: 0 % (ref 0–1)
Eosinophils Absolute: 0.1 10*3/uL (ref 0.0–0.7)
Eosinophils Relative: 1 % (ref 0–5)
HCT: 41 % (ref 39.0–52.0)
Hemoglobin: 14.3 g/dL (ref 13.0–17.0)
Lymphocytes Relative: 40 % (ref 12–46)
Lymphs Abs: 2.9 10*3/uL (ref 0.7–4.0)
MCH: 27 pg (ref 26.0–34.0)
MCHC: 34.9 g/dL (ref 30.0–36.0)
MCV: 77.5 fL — ABNORMAL LOW (ref 78.0–100.0)
Monocytes Absolute: 0.4 10*3/uL (ref 0.1–1.0)
Monocytes Relative: 6 % (ref 3–12)
Neutro Abs: 3.9 10*3/uL (ref 1.7–7.7)
Neutrophils Relative %: 53 % (ref 43–77)
Platelets: 260 10*3/uL (ref 150–400)
RBC: 5.29 MIL/uL (ref 4.22–5.81)
RDW: 12.8 % (ref 11.5–15.5)
WBC: 7.4 10*3/uL (ref 4.0–10.5)

## 2012-09-21 LAB — GLUCOSE, CAPILLARY
Glucose-Capillary: 431 mg/dL — ABNORMAL HIGH (ref 70–99)
Glucose-Capillary: 371 mg/dL — ABNORMAL HIGH (ref 70–99)
Glucose-Capillary: 264 mg/dL — ABNORMAL HIGH (ref 70–99)
Glucose-Capillary: 289 mg/dL — ABNORMAL HIGH (ref 70–99)

## 2012-09-21 LAB — COMPREHENSIVE METABOLIC PANEL
ALT: 13 U/L (ref 0–53)
AST: 14 U/L (ref 0–37)
Albumin: 4.1 g/dL (ref 3.5–5.2)
Alkaline Phosphatase: 105 U/L (ref 39–117)
BUN: 15 mg/dL (ref 6–23)
CO2: 26 mEq/L (ref 19–32)
Calcium: 10.1 mg/dL (ref 8.4–10.5)
Chloride: 93 mEq/L — ABNORMAL LOW (ref 96–112)
Creatinine, Ser: 0.7 mg/dL (ref 0.50–1.35)
GFR calc Af Amer: >90 mL/min (ref >90)
GFR calc non Af Amer: >90 mL/min (ref >90)
Glucose, Bld: 430 mg/dL — ABNORMAL HIGH (ref 70–99)
Potassium: 4.3 mEq/L (ref 3.5–5.1)
Sodium: 132 mEq/L — ABNORMAL LOW (ref 135–145)
Total Bilirubin: 0.4 mg/dL (ref 0.3–1.2)
Total Protein: 9 g/dL — ABNORMAL HIGH (ref 6.0–8.3)

## 2012-09-21 MED ORDER — TRAMADOL HCL 50 MG PO TABS
50.0000 mg | ORAL_TABLET | Freq: Once | ORAL | Status: AC
  Administered 2012-09-21: 50 mg via ORAL
  Filled 2012-09-21: qty 1

## 2012-09-21 MED ORDER — SODIUM CHLORIDE 0.9 % IV BOLUS (SEPSIS)
1000.0000 mL | Freq: Once | INTRAVENOUS | Status: AC
  Administered 2012-09-21: 1000 mL via INTRAVENOUS

## 2012-09-21 MED ORDER — INSULIN SYRINGES (DISPOSABLE) U-100 1 ML MISC: 1.0000 | Freq: Two times a day (BID) | Status: DC

## 2012-09-21 NOTE — ED Notes (Signed)
States his sugar is up x 1 week feeling bad

## 2012-09-21 NOTE — ED Provider Notes (Signed)
History     CSN: [id number]  Arrival date & time 09/21/12  1024   First MD Initiated Contact with Patient 09/21/12 1315      Chief Complaint  Patient presents with  . Hyperglycemia    (Consider location/radiation/quality/duration/timing/severity/associated sxs/prior treatment) HPI  Keith [name] is a 47 y.o. male with past medical history significant for insulin-dependent diabetes and HIV AIDS complaining of elevated sugar and generalized weakness worsening over the course of the last week. Patient states he's been compliant with his insulin and other medications. He denies chest pain, shortness of breath, cough, fever, nausea, vomiting, abdominal pain, change in bowel or bladder habits.  Past Medical History  Diagnosis Date  . HIV (human immunodeficiency virus infection) 2009    CD4 count 100, VL 13800 (05/01/2010)  . Diabetes type 2, uncontrolled     HgA1c 17.6 (04/27/2010)  . Hypertension   . Genital warts   . Erectile dysfunction   . Chronic knee pain     right  . Osteomyelitis     h/o hand  . Diabetes mellitus   . Chronic pain   . AIDS     History reviewed. No pertinent past surgical history.  Family History  Problem Relation Age of Onset  . Hypertension Mother   . Arthritis Father   . Hypertension Father   . Hypertension Brother   . Cancer Maternal Grandmother 72    unknown type of cancer  . Depression Paternal Grandmother     History  Substance Use Topics  . Smoking status: Never Smoker   . Smokeless tobacco: Never Used  . Alcohol Use: No      Review of Systems  Constitutional: Negative for fever.  Respiratory: Negative for shortness of breath.   Cardiovascular: Negative for chest pain.  Gastrointestinal: Negative for nausea, vomiting, abdominal pain and diarrhea.  Endocrine: Positive for polydipsia, polyphagia and polyuria.  All other systems reviewed and are negative.    Allergies  Sulfa antibiotics  Home Medications   Current  Outpatient Rx  Name  Route  Sig  Dispense  Refill  . aspirin 81 MG EC tablet   Oral   Take 81 mg by mouth every morning.         Marland Kitchen atenolol (TENORMIN) 50 MG tablet   Oral   Take 50 mg by mouth every morning.         . dapsone 100 MG tablet   Oral   Take 100 mg by mouth every morning.         . Darunavir Ethanolate (PREZISTA) 800 MG tablet   Oral   Take 800 mg by mouth daily with breakfast.         . emtricitabine-tenofovir (TRUVADA) 200-300 MG per tablet   Oral   Take 1 tablet by mouth every morning.         Marland Kitchen glipiZIDE (GLUCOTROL XL) 5 MG 24 hr tablet   Oral   Take 5 mg by mouth every morning.         . insulin glargine (LANTUS) 100 UNIT/ML injection   Subcutaneous   Inject 50 Units into the skin at bedtime.         Marland Kitchen lisinopril (PRINIVIL,ZESTRIL) 40 MG tablet   Oral   Take 40 mg by mouth every morning.         . metFORMIN (GLUCOPHAGE) 500 MG tablet   Oral   Take 500 mg by mouth 2 (two) times daily with a meal.         .  raltegravir (ISENTRESS) 400 MG tablet   Oral   Take 400 mg by mouth 2 (two) times daily.         . ritonavir (NORVIR) 100 MG capsule   Oral   Take 100 mg by mouth daily.         Marland Kitchen testosterone (ANDROGEL) 50 MG/5GM GEL   Transdermal   Place 5 g onto the skin every evening.            BP 136/81  Pulse 82  Temp(Src) 97.9 F (36.6 C)  Resp 16  SpO2 99%  Physical Exam  Nursing note and vitals reviewed. Constitutional: He is oriented to person, place, and time. He appears well-developed and well-nourished. No distress.  HENT:  Head: Normocephalic and atraumatic.  Dry MM  Eyes: Conjunctivae and EOM are normal. Pupils are equal, round, and reactive to light.  Neck: Normal range of motion. No JVD present.  Cardiovascular: Normal rate, regular rhythm and intact distal pulses.   Pulmonary/Chest: Effort normal and breath sounds normal. No stridor. No respiratory distress. He has no wheezes. He has no rales. He exhibits  no tenderness.  Abdominal: Soft. He exhibits no distension and no mass. There is no tenderness. There is no rebound and no guarding.  Musculoskeletal: Normal range of motion. He exhibits no edema.  Neurological: He is alert and oriented to person, place, and time.  Psychiatric: He has a normal mood and affect.    ED Course  Procedures (including critical care time)  Labs Reviewed  CBC WITH DIFFERENTIAL - Abnormal; Notable for the following:    MCV 77.5 (*)    All other components within normal limits  COMPREHENSIVE METABOLIC PANEL - Abnormal; Notable for the following:    Sodium 132 (*)    Chloride 93 (*)    Glucose, Bld 430 (*)    Total Protein 9.0 (*)    All other components within normal limits  GLUCOSE, CAPILLARY - Abnormal; Notable for the following:    Glucose-Capillary 431 (*)    All other components within normal limits  GLUCOSE, CAPILLARY - Abnormal; Notable for the following:    Glucose-Capillary 371 (*)    All other components within normal limits   No results found.   1. Hyperglycemia without ketosis       MDM   Filed Vitals:   09/21/12 1043 09/21/12 1330  BP: 105/71 136/81  Pulse: 105 82  Temp: 97.9 F (36.6 C)   Resp: 16   SpO2: 99% 99%     Keith [name] is a 47 y.o. male with elevated blood sugars for one week. Patient has CBG of 431, no significant anion gap at 13. Patient will be given fluid boluses. Patient is requesting pain medication for his chronic knee pain I will give him tramadol.  Patient's blood sugar has gone down to 264 after 3 L of normal saline. I have advised the patient it is very important that he follow with his primary care physician for further titration of his insulin. Patient has verbalized his understanding. Return precautions discussed.  Medications  sodium chloride 0.9 % bolus 1,000 mL (0 mLs Intravenous Stopped 09/21/12 1340)  sodium chloride 0.9 % bolus 1,000 mL (0 mLs Intravenous Stopped 09/21/12 1501)  traMADol  (ULTRAM) tablet 50 mg (50 mg Oral Given 09/21/12 1405)  sodium chloride 0.9 % bolus 1,000 mL (1,000 mLs Intravenous New Bag/Given 09/21/12 1502)    The patient is hemodynamically stable, appropriate for, and amenable to, discharge at  this time. Pt verbalized understanding and agrees with care plan. Outpatient follow-up and return precautions given.         Monico Blitz, PA-C 09/21/12 1524

## 2012-09-22 NOTE — ED Provider Notes (Signed)
Medical screening examination/treatment/procedure(s) were performed by non-physician practitioner and as supervising physician I was immediately available for consultation/collaboration.   Mylinda Latina III, MD 09/22/12 [phone]

## 2012-10-01 ENCOUNTER — Emergency Department (INDEPENDENT_AMBULATORY_CARE_PROVIDER_SITE_OTHER)
Admission: EM | Admit: 2012-10-01 | Discharge: 2012-10-01 | Disposition: A | Discharge status: Home / Self Care | Payer: Medicare Other | Source: Home / Self Care

## 2012-10-01 ENCOUNTER — Encounter (HOSPITAL_COMMUNITY): Payer: Self-pay | Admitting: Emergency Medicine

## 2012-10-01 DIAGNOSIS — W57XXXA Bitten or stung by nonvenomous insect and other nonvenomous arthropods, initial encounter: Secondary | ICD-10-CM

## 2012-10-01 DIAGNOSIS — M25561 Pain in right knee: Secondary | ICD-10-CM

## 2012-10-01 DIAGNOSIS — M25569 Pain in unspecified knee: Secondary | ICD-10-CM

## 2012-10-01 DIAGNOSIS — T148 Other injury of unspecified body region: Secondary | ICD-10-CM

## 2012-10-01 DIAGNOSIS — E119 Type 2 diabetes mellitus without complications: Secondary | ICD-10-CM

## 2012-10-01 DIAGNOSIS — R7309 Other abnormal glucose: Secondary | ICD-10-CM

## 2012-10-01 DIAGNOSIS — R739 Hyperglycemia, unspecified: Secondary | ICD-10-CM

## 2012-10-01 DIAGNOSIS — G8929 Other chronic pain: Secondary | ICD-10-CM

## 2012-10-01 LAB — POCT URINALYSIS DIP (DEVICE)
Bilirubin Urine: NEGATIVE
Glucose, UA: 500 mg/dL — AB
Ketones, ur: NEGATIVE mg/dL
Leukocytes, UA: NEGATIVE
Nitrite: NEGATIVE
Protein, ur: NEGATIVE mg/dL
Specific Gravity, Urine: 1.025 (ref 1.005–1.030)
Urobilinogen, UA: 1 mg/dL (ref 0.0–1.0)
pH: 6 (ref 5.0–8.0)

## 2012-10-01 LAB — POCT I-STAT, CHEM 8
BUN: 13 mg/dL (ref 6–23)
Calcium, Ion: 1.24 mmol/L — ABNORMAL HIGH (ref 1.12–1.23)
Chloride: 97 mEq/L (ref 96–112)
Creatinine, Ser: 0.9 mg/dL (ref 0.50–1.35)
Glucose, Bld: 359 mg/dL — ABNORMAL HIGH (ref 70–99)
HCT: 45 % (ref 39.0–52.0)
Hemoglobin: 15.3 g/dL (ref 13.0–17.0)
Potassium: 4.4 mEq/L (ref 3.5–5.1)
Sodium: 136 mEq/L (ref 135–145)
TCO2: 29 mmol/L (ref 0–100)

## 2012-10-01 MED ORDER — INSULIN ASPART 100 UNIT/ML ~~LOC~~ SOLN
SUBCUTANEOUS | Status: AC
  Filled 2012-10-01: qty 1

## 2012-10-01 MED ORDER — HYDROCODONE-ACETAMINOPHEN 5-325 MG PO TABS
ORAL_TABLET | ORAL | Status: AC
  Filled 2012-10-01: qty 1

## 2012-10-01 MED ORDER — TRAMADOL HCL 50 MG PO TABS: 50.0000 mg | ORAL_TABLET | Freq: Four times a day (QID) | ORAL | Status: DC | PRN

## 2012-10-01 MED ORDER — INSULIN ASPART 100 UNIT/ML ~~LOC~~ SOLN
9.0000 [IU] | Freq: Once | SUBCUTANEOUS | Status: AC
  Administered 2012-10-01: 9 [IU] via SUBCUTANEOUS

## 2012-10-01 MED ORDER — SODIUM CHLORIDE 0.9 % IV SOLN: Freq: Once | INTRAVENOUS | Status: DC

## 2012-10-01 MED ORDER — HYDROCODONE-ACETAMINOPHEN 5-325 MG PO TABS
1.0000 | ORAL_TABLET | Freq: Once | ORAL | Status: AC
  Administered 2012-10-01: 1 via ORAL

## 2012-10-01 NOTE — ED Notes (Signed)
Checking for uncompleted orders
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
## 2012-10-01 NOTE — ED Notes (Signed)
C/o hyperglycemia test are between 800/1000. Pt states he takes insulin shots and oral meds.  Pt states he has had some dizziness.  Pt fell yesterday on the cement floor and hurt his back and right knee.  Pt also has a rash on left side of stomach and along jaw line of left ear.  Pt is oriented and in no acute distress.

## 2012-10-01 NOTE — ED Provider Notes (Signed)
Medical screening examination/treatment/procedure(s) were performed by non-physician practitioner and as supervising physician I was immediately available for consultation/collaboration.  Burnett Kanaris, MD 10/01/12 1525

## 2012-10-06 IMAGING — CR DG CHEST 2V
2 series · 2 of 2 positions shown · non-contrast
Comparison: 04/06/2011

CLINICAL DATA: HIV, cough

CHEST - 2 VIEW

[w chest pa]
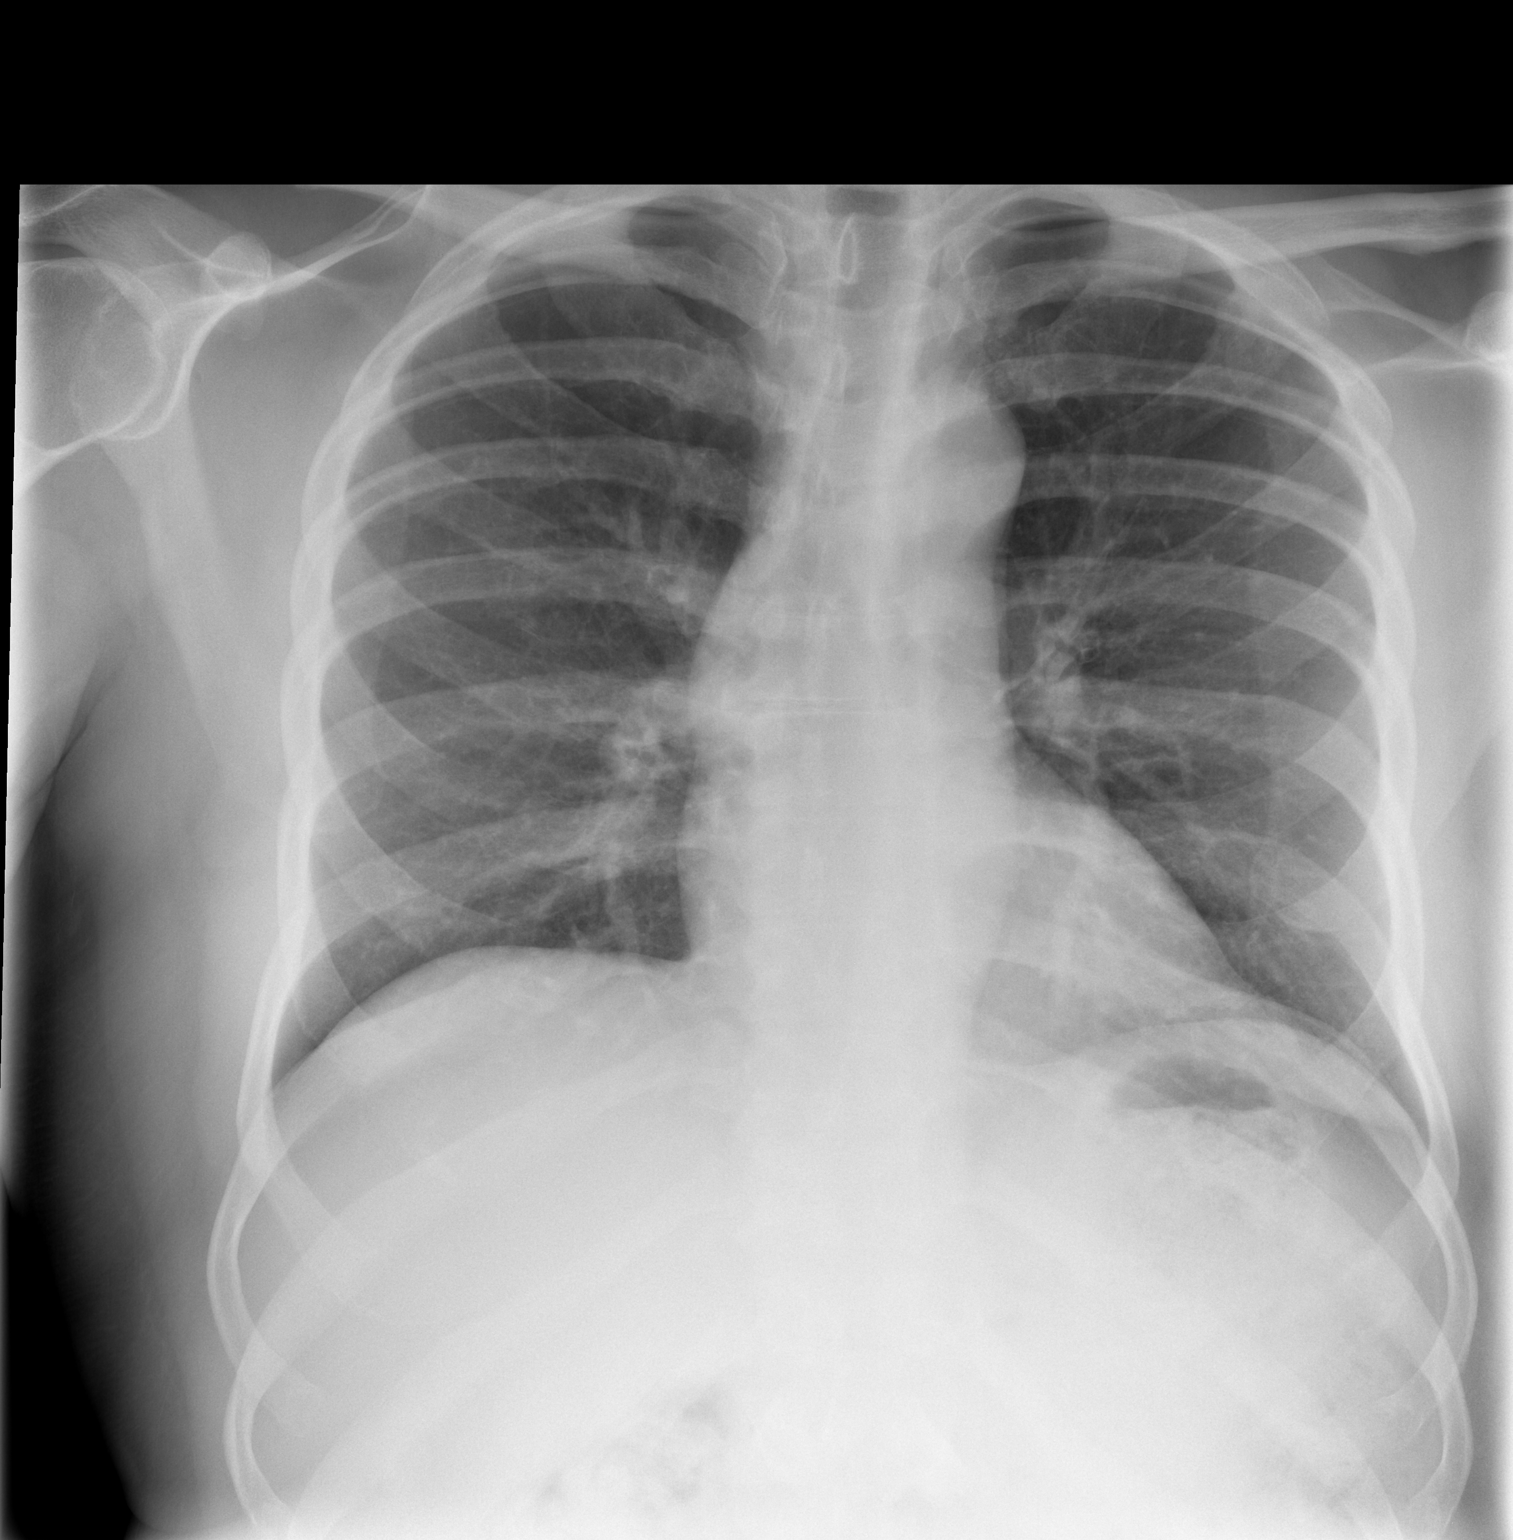

[w chest lat]
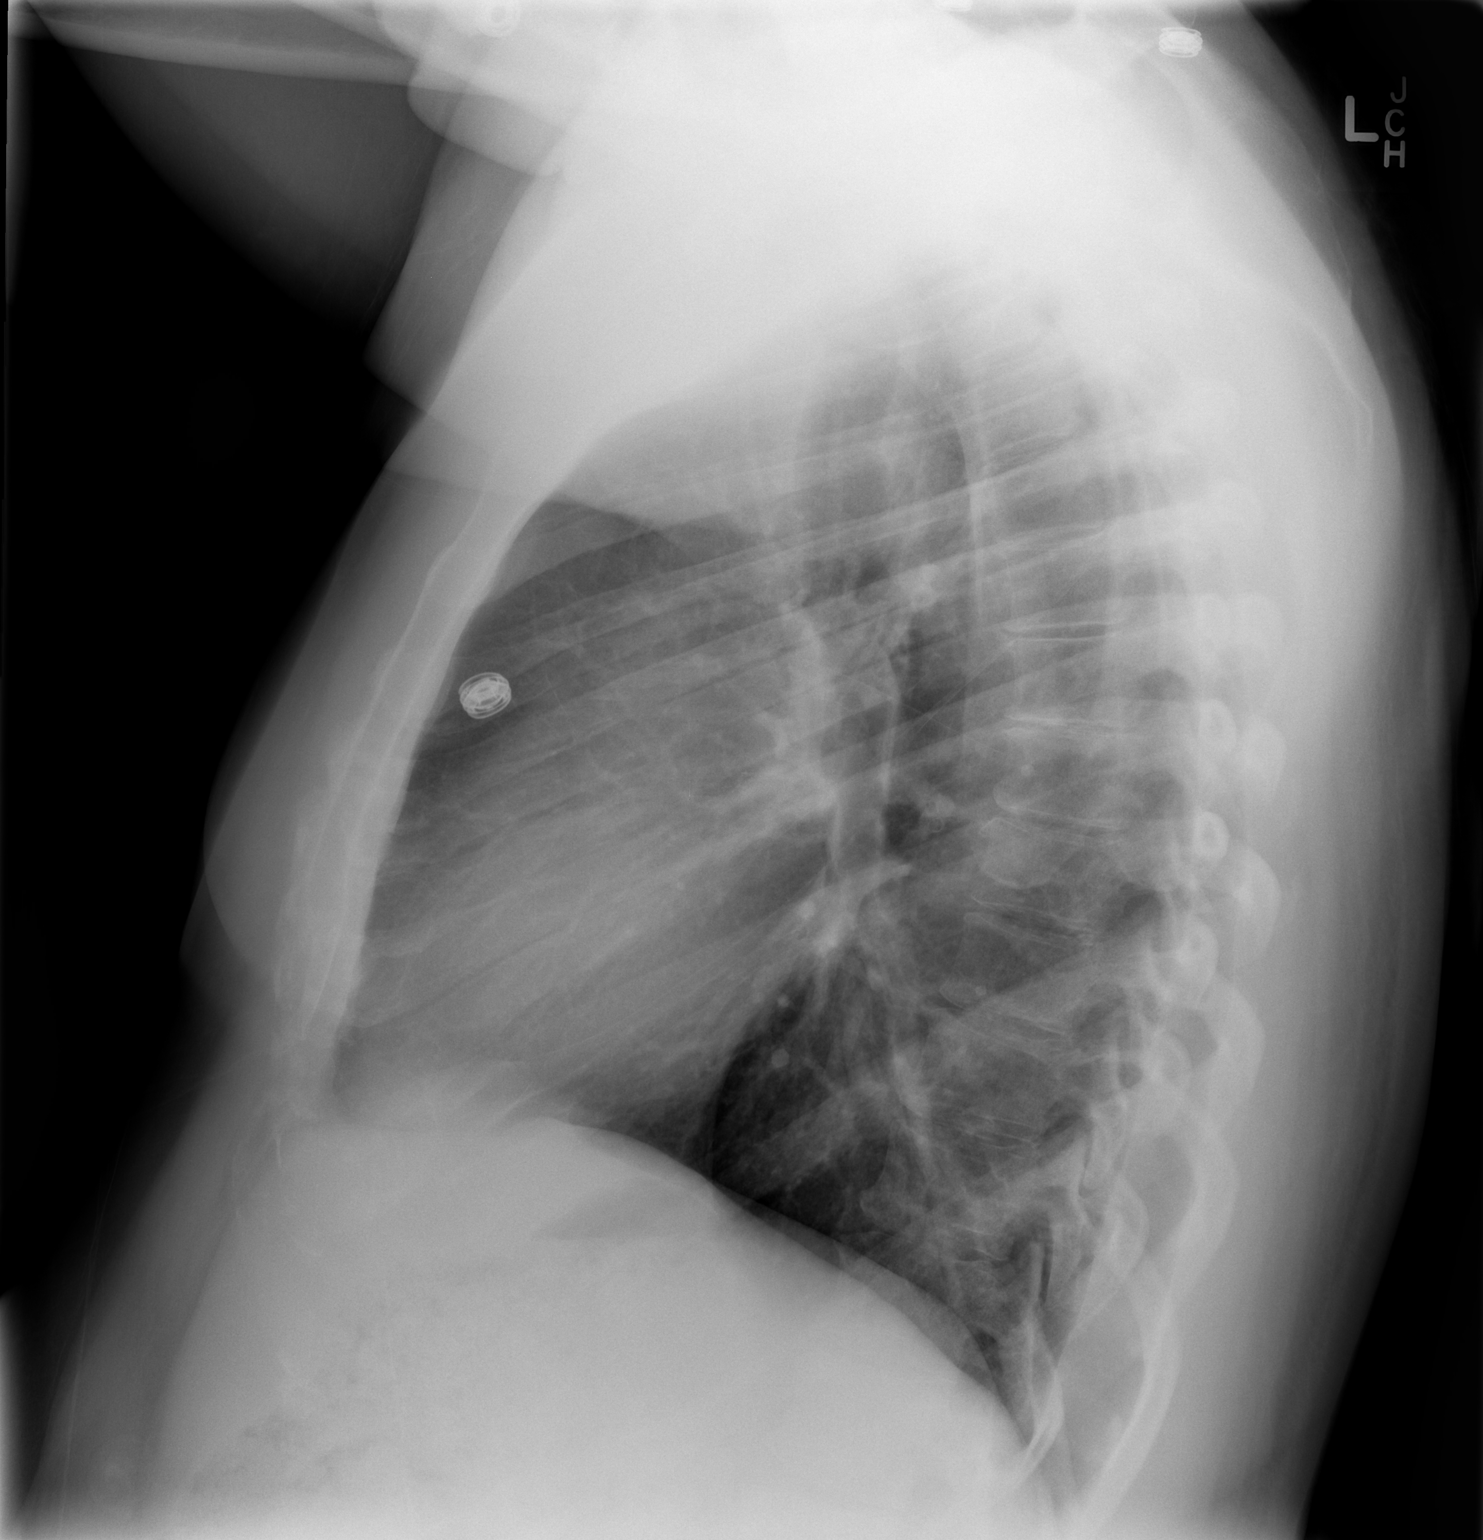

[2 of 2 positions shown; findings below may reference images not displayed]

FINDINGS: The heart size and mediastinal contours are within
normal limits.  Both lungs are clear.  The visualized skeletal
structures are unremarkable.
IMPRESSION: No active cardiopulmonary disease.

## 2012-10-15 ENCOUNTER — Other Ambulatory Visit: Payer: Self-pay

## 2012-10-20 ENCOUNTER — Other Ambulatory Visit: Payer: Self-pay | Admitting: *Deleted

## 2012-10-20 NOTE — Telephone Encounter (Signed)
Pt using lantus solostar, needs pen needles.

## 2012-10-21 MED ORDER — INSULIN PEN NEEDLE 31G X 5 MM MISC: 1.0000 "application " | Freq: Once | Status: DC

## 2012-11-13 ENCOUNTER — Encounter: Payer: Medicaid Other | Admitting: Internal Medicine

## 2012-11-20 ENCOUNTER — Ambulatory Visit (INDEPENDENT_AMBULATORY_CARE_PROVIDER_SITE_OTHER): Payer: Medicare Other | Admitting: Infectious Diseases

## 2012-11-20 ENCOUNTER — Encounter: Payer: Medicaid Other | Admitting: Internal Medicine

## 2012-11-20 ENCOUNTER — Encounter: Payer: Self-pay | Admitting: Internal Medicine

## 2012-11-20 ENCOUNTER — Other Ambulatory Visit: Payer: Self-pay | Admitting: Infectious Diseases

## 2012-11-20 ENCOUNTER — Other Ambulatory Visit: Payer: Self-pay | Admitting: Licensed Clinical Social Worker

## 2012-11-20 ENCOUNTER — Encounter: Payer: Self-pay | Admitting: Infectious Diseases

## 2012-11-20 VITALS — BP 113/78 | HR 97 | Temp 98.2°F | Ht 69.0 in | Wt 268.0 lb

## 2012-11-20 DIAGNOSIS — K009 Disorder of tooth development, unspecified: Secondary | ICD-10-CM

## 2012-11-20 DIAGNOSIS — E1165 Type 2 diabetes mellitus with hyperglycemia: Secondary | ICD-10-CM

## 2012-11-20 DIAGNOSIS — M1710 Unilateral primary osteoarthritis, unspecified knee: Secondary | ICD-10-CM

## 2012-11-20 DIAGNOSIS — K006 Disturbances in tooth eruption: Secondary | ICD-10-CM

## 2012-11-20 DIAGNOSIS — IMO0002 Reserved for concepts with insufficient information to code with codable children: Secondary | ICD-10-CM

## 2012-11-20 DIAGNOSIS — M1711 Unilateral primary osteoarthritis, right knee: Secondary | ICD-10-CM

## 2012-11-20 DIAGNOSIS — IMO0001 Reserved for inherently not codable concepts without codable children: Secondary | ICD-10-CM

## 2012-11-20 DIAGNOSIS — Z23 Encounter for immunization: Secondary | ICD-10-CM

## 2012-11-20 DIAGNOSIS — B20 Human immunodeficiency virus [HIV] disease: Secondary | ICD-10-CM

## 2012-11-20 LAB — T-HELPER CELLS (CD4) COUNT (NOT AT ARMC)
CD4 % Helper T Cell: 5 % — ABNORMAL LOW (ref 33–55)
CD4 T Cell Abs: 100 uL — ABNORMAL LOW (ref 400–2700)

## 2012-11-20 LAB — COMPLETE METABOLIC PANEL WITH GFR
ALT: 11 U/L (ref 0–53)
AST: 16 U/L (ref 0–37)
Albumin: 4.8 g/dL (ref 3.5–5.2)
Alkaline Phosphatase: 78 U/L (ref 39–117)
BUN: 14 mg/dL (ref 6–23)
CO2: 27 mEq/L (ref 19–32)
Calcium: 9.9 mg/dL (ref 8.4–10.5)
Chloride: 97 mEq/L (ref 96–112)
Creat: 0.91 mg/dL (ref 0.50–1.35)
GFR, Est African American: >89 mL/min
GFR, Est Non African American: >89 mL/min
Glucose, Bld: 380 mg/dL — ABNORMAL HIGH (ref 70–99)
Potassium: 4.1 mEq/L (ref 3.5–5.3)
Sodium: 130 mEq/L — ABNORMAL LOW (ref 135–145)
Total Bilirubin: 0.6 mg/dL (ref 0.3–1.2)
Total Protein: 8 g/dL (ref 6.0–8.3)

## 2012-11-20 LAB — CBC
HCT: 39 % (ref 39.0–52.0)
Hemoglobin: 13.1 g/dL (ref 13.0–17.0)
MCH: 27.2 pg (ref 26.0–34.0)
MCHC: 33.6 g/dL (ref 30.0–36.0)
MCV: 80.9 fL (ref 78.0–100.0)
Platelets: 232 10*3/uL (ref 150–400)
RBC: 4.82 MIL/uL (ref 4.22–5.81)
RDW: 14.7 % (ref 11.5–15.5)
WBC: 4.7 10*3/uL (ref 4.0–10.5)

## 2012-11-20 LAB — HEPATITIS B SURFACE ANTIBODY,QUALITATIVE: Hep B S Ab: NEGATIVE

## 2012-11-20 MED ORDER — HYDROCODONE-ACETAMINOPHEN 10-660 MG PO TABS: 1.0000 | ORAL_TABLET | Freq: Three times a day (TID) | ORAL | Status: DC | PRN

## 2012-11-20 NOTE — Assessment & Plan Note (Signed)
Pt states he is taking meds. Will send him to lab to assess. His vax are not uptodate- needs pnvx. Need to check Hep B Sab- had only 1 vax and prev (-) Ab.  Will see him back in 4 months.

## 2012-11-20 NOTE — Assessment & Plan Note (Signed)
Asks for refill of his vicodin 10 to bridge him til he gets f/u at first of Sept. Will give him 10 tabs.

## 2012-11-20 NOTE — Assessment & Plan Note (Signed)
He has dental f/u, states he needs to have work done while in hospital

## 2012-11-20 NOTE — Assessment & Plan Note (Signed)
States he has f/u at IM center. Greatly appreciate their f/u.
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
## 2012-11-20 NOTE — Addendum Note (Signed)
Addended by: Jarrett Ables D on: 11/20/2012 11:19 AM   Modules accepted: Orders

## 2012-11-20 NOTE — Addendum Note (Signed)
Addended by: Myrtis Hopping A on: 11/20/2012 10:51 AM   Modules accepted: Orders

## 2012-11-21 ENCOUNTER — Other Ambulatory Visit: Payer: Self-pay | Admitting: Internal Medicine

## 2012-11-23 ENCOUNTER — Other Ambulatory Visit: Payer: Self-pay | Admitting: *Deleted

## 2012-11-23 ENCOUNTER — Telehealth: Payer: Self-pay | Admitting: *Deleted

## 2012-11-23 LAB — HIV-1 RNA ULTRAQUANT REFLEX TO GENTYP+
HIV 1 RNA Quant: <20 copies/mL (ref <20)
HIV-1 RNA Quant, Log: <1.3 {Log} (ref <1.30)

## 2012-11-23 NOTE — Telephone Encounter (Signed)
Patient came into office, requesting a refill of his vicodin. Dr. [name] gave the pt an rx for 10 vicodin on Friday 11/20/12 to bridge him until his appointment.  Pt states he had to use them all this weekend and he cannot be seen by his PCP until 12/18/12. Dr [name] unable to give patient any more.  RN suggested that he try heat or cold therapy, OTC medication as directed. Pt became angry, stating that won't work. RN suggested that if he needs something more, he go to the ED or urgent care.  Pt left the clinic. Landis Gandy, RN

## 2012-11-24 MED ORDER — INSULIN PEN NEEDLE 31G X 5 MM MISC: 1.0000 "application " | Freq: Once | Status: DC

## 2012-12-18 ENCOUNTER — Encounter: Payer: Self-pay | Admitting: Internal Medicine

## 2012-12-18 ENCOUNTER — Ambulatory Visit (INDEPENDENT_AMBULATORY_CARE_PROVIDER_SITE_OTHER): Payer: Medicare Other | Admitting: Internal Medicine

## 2012-12-18 VITALS — BP 134/98 | HR 83 | Temp 97.8°F | Ht 69.0 in | Wt 275.0 lb

## 2012-12-18 DIAGNOSIS — IMO0002 Reserved for concepts with insufficient information to code with codable children: Secondary | ICD-10-CM

## 2012-12-18 DIAGNOSIS — Z23 Encounter for immunization: Secondary | ICD-10-CM

## 2012-12-18 DIAGNOSIS — E1165 Type 2 diabetes mellitus with hyperglycemia: Secondary | ICD-10-CM

## 2012-12-18 DIAGNOSIS — E1310 Other specified diabetes mellitus with ketoacidosis without coma: Secondary | ICD-10-CM

## 2012-12-18 DIAGNOSIS — IMO0001 Reserved for inherently not codable concepts without codable children: Secondary | ICD-10-CM

## 2012-12-18 DIAGNOSIS — I10 Essential (primary) hypertension: Secondary | ICD-10-CM

## 2012-12-18 DIAGNOSIS — B20 Human immunodeficiency virus [HIV] disease: Secondary | ICD-10-CM

## 2012-12-18 DIAGNOSIS — E1110 Type 2 diabetes mellitus with ketoacidosis without coma: Secondary | ICD-10-CM

## 2012-12-18 DIAGNOSIS — E785 Hyperlipidemia, unspecified: Secondary | ICD-10-CM

## 2012-12-18 LAB — LIPID PANEL
Cholesterol: 306 mg/dL — ABNORMAL HIGH (ref 0–200)
HDL: 43 mg/dL (ref >39)
LDL Cholesterol: 225 mg/dL — ABNORMAL HIGH (ref 0–99)
Total CHOL/HDL Ratio: 7.1 Ratio
Triglycerides: 191 mg/dL — ABNORMAL HIGH (ref <150)
VLDL: 38 mg/dL (ref 0–40)

## 2012-12-18 LAB — POCT GLYCOSYLATED HEMOGLOBIN (HGB A1C): Hemoglobin A1C: 10.2

## 2012-12-18 LAB — GLUCOSE, CAPILLARY: Glucose-Capillary: 142 mg/dL — ABNORMAL HIGH (ref 70–99)

## 2012-12-18 MED ORDER — LISINOPRIL 40 MG PO TABS: 40.0000 mg | ORAL_TABLET | Freq: Every morning | ORAL | Status: DC

## 2012-12-18 MED ORDER — INSULIN GLARGINE 100 UNIT/ML ~~LOC~~ SOLN: 50.0000 [IU] | Freq: Every day | SUBCUTANEOUS | Status: DC

## 2012-12-18 MED ORDER — GLIPIZIDE ER 5 MG PO TB24: 5.0000 mg | ORAL_TABLET | Freq: Every morning | ORAL | Status: DC
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
## 2012-12-18 NOTE — Patient Instructions (Signed)
Goals (1 Years of Data) as of 12/18/12         11/20/12 10/01/12 09/21/12 09/21/12 09/21/12     Blood Pressure    . Blood Pressure < 140/90  113/78 108/83 142/93 123/99 137/90     Result Component    . HEMOGLOBIN A1C < 10.0          . LDL CALC < 100           It is very important that we try and help you with your blood sugars.

## 2012-12-19 ENCOUNTER — Other Ambulatory Visit: Payer: Self-pay | Admitting: Internal Medicine

## 2012-12-19 DIAGNOSIS — B20 Human immunodeficiency virus [HIV] disease: Secondary | ICD-10-CM

## 2012-12-19 MED ORDER — PRAVASTATIN SODIUM 40 MG PO TABS: 40.0000 mg | ORAL_TABLET | Freq: Every evening | ORAL | Status: DC

## 2012-12-19 NOTE — Assessment & Plan Note (Signed)
patient last hemoglobin A1c on 8/13 was 15 markedly increased from his last HgbA1c of 7. Patient attributes this to poor management of his diet and medication non-compliance. Pt last urine microalbumin 1/12 was 7.45 and last CMP on 8/13 showed Cr of 1.91.  -repeat HgbA1c, lipid panel, urine microalbumin  -foot exam  -opthomology referral  -influenza vaccine

## 2012-12-21 NOTE — Progress Notes (Signed)
Case discussed with Dr. Sadek soon after the resident saw the patient.  We reviewed the resident's history and exam and pertinent patient test results.  I agree with the assessment, diagnosis, and plan of care documented in the resident's note. 

## 2012-12-29 NOTE — Pre-Procedure Instructions (Addendum)
Keith [name]  12/29/2012   Your procedure is scheduled on:  January 18, 2013 at 7:30 AM  Report to [facility] Entrance "A"at 5:30 AM.  Call this number if you have problems the morning of surgery: [phone]   Remember:   Do not eat food or drink liquids after midnight Sunday 01/17/13   Take these medicines the morning of surgery with A SIP OF WATER: atenolol, dapsone, antiviral meds, pain med if needed         STOP aspirin  01/13/13   Do not wear jewelry, make-up or nail polish.  Do not wear lotions, powders, or perfumes. You may wear deodorant.  Do not shave 48 hours prior to surgery. Men may shave face and neck.  Do not bring valuables to the hospital.  [facility] is not responsible                   for any belongings or valuables.  Contacts, dentures or bridgework may not be worn into surgery.  Leave suitcase in the car. After surgery it may be brought to your room.  For patients admitted to the hospital, checkout time is 11:00 AM the day of  discharge.   Patients discharged the day of surgery will not be allowed to drive  home.  Name and phone number of your driver: Family/Friend   Special Instructions: Shower using CHG 2 nights before surgery and the night before surgery.  If you shower the day of surgery use CHG.  Use special wash - you have one bottle of CHG for all showers.  You should use approximately 1/3 of the bottle for each shower.   Please read over the following fact sheets that you were given: Pain Booklet, Coughing and Deep Breathing and Surgical Site Infection Prevention

## 2012-12-30 ENCOUNTER — Encounter (HOSPITAL_COMMUNITY): Payer: Self-pay

## 2012-12-30 ENCOUNTER — Encounter (HOSPITAL_COMMUNITY)
Admission: RE | Admit: 2012-12-30 | Discharge: 2012-12-30 | Disposition: A | Discharge status: Home / Self Care | Payer: Medicaid Other | Source: Ambulatory Visit | Attending: Oral Surgery | Admitting: Oral Surgery

## 2012-12-30 DIAGNOSIS — Z01812 Encounter for preprocedural laboratory examination: Secondary | ICD-10-CM | POA: Insufficient documentation

## 2012-12-30 DIAGNOSIS — Z01818 Encounter for other preprocedural examination: Secondary | ICD-10-CM | POA: Insufficient documentation

## 2012-12-30 DIAGNOSIS — Z01810 Encounter for preprocedural cardiovascular examination: Secondary | ICD-10-CM | POA: Insufficient documentation

## 2012-12-30 LAB — BASIC METABOLIC PANEL
BUN: 14 mg/dL (ref 6–23)
CO2: 27 mEq/L (ref 19–32)
Calcium: 9.4 mg/dL (ref 8.4–10.5)
Chloride: 100 mEq/L (ref 96–112)
Creatinine, Ser: 0.71 mg/dL (ref 0.50–1.35)
GFR calc Af Amer: >90 mL/min (ref >90)
GFR calc non Af Amer: >90 mL/min (ref >90)
Glucose, Bld: 205 mg/dL — ABNORMAL HIGH (ref 70–99)
Potassium: 4 mEq/L (ref 3.5–5.1)
Sodium: 136 mEq/L (ref 135–145)

## 2012-12-30 LAB — CBC
HCT: 36.5 % — ABNORMAL LOW (ref 39.0–52.0)
Hemoglobin: 12.1 g/dL — ABNORMAL LOW (ref 13.0–17.0)
MCH: 27.8 pg (ref 26.0–34.0)
MCHC: 33.2 g/dL (ref 30.0–36.0)
MCV: 83.7 fL (ref 78.0–100.0)
Platelets: 217 10*3/uL (ref 150–400)
RBC: 4.36 MIL/uL (ref 4.22–5.81)
RDW: 13.8 % (ref 11.5–15.5)
WBC: 5.5 10*3/uL (ref 4.0–10.5)

## 2012-12-30 NOTE — Progress Notes (Signed)
12/30/12 1119  OBSTRUCTIVE SLEEP APNEA  Have you ever been diagnosed with sleep apnea through a sleep study? No  Do you snore loudly (loud enough to be heard through closed doors)?  0  Do you often feel tired, fatigued, or sleepy during the daytime? 0  Has anyone observed you stop breathing during your sleep? 0  Do you have, or are you being treated for high blood pressure? 1  BMI more than 35 kg/m2? 1  Age over 48 years old? 0  Neck circumference greater than 40 cm/18 inches? 1 (18.5)  Gender: 1  Obstructive Sleep Apnea Score 4  Score 4 or greater  Results sent to PCP

## 2012-12-30 NOTE — Progress Notes (Signed)
Patient not here for pre-admit interview.  Attempted to call the pt, message left for him to call us and let us know if he will be able to make his appointment or not.  Attempted to call the pt's mom, but her number has been disconnected.

## 2012-12-30 NOTE — Progress Notes (Signed)
Office called for orders. Spoke with jody

## 2013-01-11 ENCOUNTER — Encounter (HOSPITAL_COMMUNITY): Payer: Self-pay | Admitting: Pharmacy Technician

## 2013-01-14 NOTE — H&P (Signed)
HISTORY AND PHYSICAL  Keith [name] is a 47 y.o. male patient with CC: Dental pain, bad teeth.  No diagnosis found.  Past Medical History  Diagnosis Date  . HIV (human immunodeficiency virus infection) 2009    CD4 count 100, VL 13800 (05/01/2010)  . Diabetes type 2, uncontrolled     HgA1c 17.6 (04/27/2010)  . Hypertension   . Genital warts   . Erectile dysfunction   . Chronic knee pain     right  . Osteomyelitis     h/o hand  . Diabetes mellitus   . Chronic pain   . AIDS     No current facility-administered medications for this encounter.   Current Outpatient Prescriptions  Medication Sig Dispense Refill  . aspirin 81 MG EC tablet Take 81 mg by mouth every morning.      Marland Kitchen atenolol (TENORMIN) 50 MG tablet Take 50 mg by mouth every morning.      . dapsone 100 MG tablet Take 100 mg by mouth every morning.      . Darunavir Ethanolate (PREZISTA) 800 MG tablet Take 800 mg by mouth daily with breakfast.      . emtricitabine-tenofovir (TRUVADA) 200-300 MG per tablet Take 1 tablet by mouth every morning.      Marland Kitchen glipiZIDE (GLUCOTROL XL) 5 MG 24 hr tablet Take 1 tablet (5 mg total) by mouth every morning.  30 tablet  11  . insulin glargine (LANTUS) 100 UNIT/ML injection Inject 0.5 mLs (50 Units total) into the skin at bedtime.  10 mL  12  . lisinopril (PRINIVIL,ZESTRIL) 40 MG tablet Take 1 tablet (40 mg total) by mouth every morning.  30 tablet  12  . metFORMIN (GLUCOPHAGE) 500 MG tablet Take 500 mg by mouth 2 (two) times daily with a meal.      . pravastatin (PRAVACHOL) 40 MG tablet Take 1 tablet (40 mg total) by mouth every evening.  30 tablet  11  . raltegravir (ISENTRESS) 400 MG tablet Take 400 mg by mouth 2 (two) times daily.      . ritonavir (NORVIR) 100 MG capsule Take 100 mg by mouth daily.      Marland Kitchen testosterone (ANDROGEL) 50 MG/5GM GEL Place 5 g onto the skin every evening.       . Insulin Pen Needle 31G X 5 MM MISC 1 application by Does not apply route once. diag code 250.02.  Insulin dependent. Used for insulin inj 1 time daily  100 each  6  . Insulin Syringes, Disposable, U-100 1 ML MISC 1 Syringe by Does not apply route 2 (two) times daily.  100 each  0   Allergies  Allergen Reactions  . Sulfa Antibiotics Itching   Active Problems:   * No active hospital problems. *  Vitals: There were no vitals taken for this visit. Lab results:No results found for this or any previous visit (from the past 86 hour(s)). Radiology Results: No results found. General appearance: alert, cooperative and mildly obese Head: Normocephalic, without obvious abnormality, atraumatic Eyes: negative Nose: Nares normal. Septum midline. Mucosa normal. No drainage or sinus tenderness. Throat: Dental caries teeth #'s 3, 6, 7, 10, 11, 13, 21, 22, 27, 28, 29, 30 Neck: no adenopathy, no JVD and supple, symmetrical, trachea midline Resp: clear to auscultation bilaterally Cardio: regular rate and rhythm, S1, S2 normal, no murmur, click, rub or gallop  Assessment: 47 YOBM HIV, HTN, DM2, Chronic pain with non-restorable teeth #'s 3, 6, 7, 10, 11, 13, 21, 22,  27, 28, 29, 30.  Plan: Extraction teeth #'s 3, 6, 7, 10, 11, 13, 21, 22, 27, 28, 29, 30, alveoloplasty. General anesthesia. Day surgery.   Keith [name] M 01/14/2013

## 2013-01-15 NOTE — Progress Notes (Signed)
Pt. Notified of time change for surgery,to arrive @ 0800. Pt. Voices understanding.

## 2013-01-17 MED ORDER — DEXTROSE 5 % IV SOLN
3.0000 g | INTRAVENOUS | Status: AC
  Administered 2013-01-18: 3 g via INTRAVENOUS
  Filled 2013-01-17: qty 3000

## 2013-01-18 ENCOUNTER — Encounter (HOSPITAL_COMMUNITY)
Admission: RE | Disposition: A | Discharge status: Home / Self Care | Payer: Self-pay | Source: Ambulatory Visit | Attending: Oral Surgery

## 2013-01-18 ENCOUNTER — Encounter (HOSPITAL_COMMUNITY): Payer: Medicaid Other | Admitting: Certified Registered"

## 2013-01-18 ENCOUNTER — Ambulatory Visit (HOSPITAL_COMMUNITY): Payer: Medicaid Other | Admitting: Certified Registered"

## 2013-01-18 ENCOUNTER — Encounter (HOSPITAL_COMMUNITY): Payer: Self-pay | Admitting: *Deleted

## 2013-01-18 ENCOUNTER — Ambulatory Visit (HOSPITAL_COMMUNITY)
Admission: RE | Admit: 2013-01-18 | Discharge: 2013-01-18 | Disposition: A | Discharge status: Home / Self Care | Payer: Medicaid Other | Source: Ambulatory Visit | Attending: Oral Surgery | Admitting: Oral Surgery

## 2013-01-18 DIAGNOSIS — E785 Hyperlipidemia, unspecified: Secondary | ICD-10-CM

## 2013-01-18 DIAGNOSIS — B20 Human immunodeficiency virus [HIV] disease: Secondary | ICD-10-CM

## 2013-01-18 DIAGNOSIS — K089 Disorder of teeth and supporting structures, unspecified: Secondary | ICD-10-CM | POA: Insufficient documentation

## 2013-01-18 DIAGNOSIS — I10 Essential (primary) hypertension: Secondary | ICD-10-CM

## 2013-01-18 DIAGNOSIS — F528 Other sexual dysfunction not due to a substance or known physiological condition: Secondary | ICD-10-CM

## 2013-01-18 DIAGNOSIS — K0530 Chronic periodontitis, unspecified: Secondary | ICD-10-CM

## 2013-01-18 DIAGNOSIS — IMO0001 Reserved for inherently not codable concepts without codable children: Secondary | ICD-10-CM | POA: Insufficient documentation

## 2013-01-18 DIAGNOSIS — Z794 Long term (current) use of insulin: Secondary | ICD-10-CM | POA: Insufficient documentation

## 2013-01-18 DIAGNOSIS — G8929 Other chronic pain: Secondary | ICD-10-CM

## 2013-01-18 DIAGNOSIS — K029 Dental caries, unspecified: Secondary | ICD-10-CM | POA: Insufficient documentation

## 2013-01-18 DIAGNOSIS — K009 Disorder of tooth development, unspecified: Secondary | ICD-10-CM

## 2013-01-18 DIAGNOSIS — IMO0002 Reserved for concepts with insufficient information to code with codable children: Secondary | ICD-10-CM

## 2013-01-18 DIAGNOSIS — G5710 Meralgia paresthetica, unspecified lower limb: Secondary | ICD-10-CM

## 2013-01-18 DIAGNOSIS — E1165 Type 2 diabetes mellitus with hyperglycemia: Secondary | ICD-10-CM

## 2013-01-18 DIAGNOSIS — M1711 Unilateral primary osteoarthritis, right knee: Secondary | ICD-10-CM

## 2013-01-18 HISTORY — PX: MULTIPLE EXTRACTIONS WITH ALVEOLOPLASTY: SHX5342

## 2013-01-18 LAB — COMPREHENSIVE METABOLIC PANEL WITH GFR
ALT: 12 U/L (ref 0–53)
AST: 15 U/L (ref 0–37)
Albumin: 3.7 g/dL (ref 3.5–5.2)
Alkaline Phosphatase: 66 U/L (ref 39–117)
BUN: 12 mg/dL (ref 6–23)
CO2: 27 meq/L (ref 19–32)
Calcium: 9.5 mg/dL (ref 8.4–10.5)
Chloride: 97 meq/L (ref 96–112)
Creatinine, Ser: 0.65 mg/dL (ref 0.50–1.35)
GFR calc Af Amer: >90 mL/min
GFR calc non Af Amer: >90 mL/min
Glucose, Bld: 199 mg/dL — ABNORMAL HIGH (ref 70–99)
Potassium: 4.1 meq/L (ref 3.5–5.1)
Sodium: 133 meq/L — ABNORMAL LOW (ref 135–145)
Total Bilirubin: 0.3 mg/dL (ref 0.3–1.2)
Total Protein: 8.1 g/dL (ref 6.0–8.3)

## 2013-01-18 LAB — CBC
HCT: 36 % — ABNORMAL LOW (ref 39.0–52.0)
Hemoglobin: 12.4 g/dL — ABNORMAL LOW (ref 13.0–17.0)
MCH: 28.7 pg (ref 26.0–34.0)
MCHC: 34.4 g/dL (ref 30.0–36.0)
MCV: 83.3 fL (ref 78.0–100.0)
Platelets: 228 10*3/uL (ref 150–400)
RBC: 4.32 MIL/uL (ref 4.22–5.81)
RDW: 13.1 % (ref 11.5–15.5)
WBC: 5.4 10*3/uL (ref 4.0–10.5)

## 2013-01-18 LAB — GLUCOSE, CAPILLARY
Glucose-Capillary: 275 mg/dL — ABNORMAL HIGH (ref 70–99)
Glucose-Capillary: 193 mg/dL — ABNORMAL HIGH (ref 70–99)
Glucose-Capillary: 197 mg/dL — ABNORMAL HIGH (ref 70–99)

## 2013-01-18 LAB — SURGICAL PCR SCREEN
MRSA, PCR: POSITIVE — AB
Staphylococcus aureus: POSITIVE — AB

## 2013-01-18 SURGERY — MULTIPLE EXTRACTION WITH ALVEOLOPLASTY
Anesthesia: General | Site: Mouth | Wound class: Clean Contaminated

## 2013-01-18 MED ORDER — SODIUM CHLORIDE 0.9 % IR SOLN
Status: DC | PRN
  Administered 2013-01-18: 1000 mL

## 2013-01-18 MED ORDER — OXYCODONE HCL 5 MG PO TABS
ORAL_TABLET | ORAL | Status: AC
  Filled 2013-01-18: qty 2

## 2013-01-18 MED ORDER — SUCCINYLCHOLINE CHLORIDE 20 MG/ML IJ SOLN
INTRAMUSCULAR | Status: DC | PRN
  Administered 2013-01-18: 100 mg via INTRAVENOUS

## 2013-01-18 MED ORDER — ROCURONIUM BROMIDE 100 MG/10ML IV SOLN
INTRAVENOUS | Status: DC | PRN
  Administered 2013-01-18: 10 mg via INTRAVENOUS

## 2013-01-18 MED ORDER — OXYCODONE HCL 10 MG PO TABS: 10.0000 mg | ORAL_TABLET | ORAL | Status: DC | PRN

## 2013-01-18 MED ORDER — FENTANYL CITRATE 0.05 MG/ML IJ SOLN
INTRAMUSCULAR | Status: DC | PRN
  Administered 2013-01-18: 100 ug via INTRAVENOUS
  Administered 2013-01-18: 50 ug via INTRAVENOUS

## 2013-01-18 MED ORDER — PROPOFOL 10 MG/ML IV BOLUS
INTRAVENOUS | Status: DC | PRN
  Administered 2013-01-18 (×2): 200 mg via INTRAVENOUS

## 2013-01-18 MED ORDER — OXYMETAZOLINE HCL 0.05 % NA SOLN
NASAL | Status: DC | PRN
  Administered 2013-01-18: 1 via NASAL

## 2013-01-18 MED ORDER — LIDOCAINE-EPINEPHRINE 2 %-1:100000 IJ SOLN
INTRAMUSCULAR | Status: DC | PRN
  Administered 2013-01-18: 18 mL

## 2013-01-18 MED ORDER — HYDROMORPHONE HCL PF 1 MG/ML IJ SOLN: 0.2500 mg | INTRAMUSCULAR | Status: DC | PRN

## 2013-01-18 MED ORDER — LIDOCAINE HCL (CARDIAC) 20 MG/ML IV SOLN
INTRAVENOUS | Status: DC | PRN
  Administered 2013-01-18: 100 mg via INTRAVENOUS

## 2013-01-18 MED ORDER — OXYCODONE HCL 5 MG PO TABS
10.0000 mg | ORAL_TABLET | ORAL | Status: DC | PRN
  Administered 2013-01-18: 10 mg via ORAL

## 2013-01-18 MED ORDER — ONDANSETRON HCL 4 MG/2ML IJ SOLN: 4.0000 mg | Freq: Once | INTRAMUSCULAR | Status: DC | PRN

## 2013-01-18 MED ORDER — GLYCOPYRROLATE 0.2 MG/ML IJ SOLN
INTRAMUSCULAR | Status: DC | PRN
  Administered 2013-01-18: 0.3 mg via INTRAVENOUS

## 2013-01-18 MED ORDER — MUPIROCIN 2 % EX OINT
TOPICAL_OINTMENT | Freq: Two times a day (BID) | CUTANEOUS | Status: DC
  Administered 2013-01-18: 1 via NASAL

## 2013-01-18 MED ORDER — LIDOCAINE-EPINEPHRINE 2 %-1:100000 IJ SOLN
INTRAMUSCULAR | Status: AC
  Filled 2013-01-18: qty 1

## 2013-01-18 MED ORDER — OXYMETAZOLINE HCL 0.05 % NA SOLN
NASAL | Status: AC
  Filled 2013-01-18: qty 15

## 2013-01-18 MED ORDER — ATENOLOL 50 MG PO TABS
50.0000 mg | ORAL_TABLET | Freq: Once | ORAL | Status: AC
  Administered 2013-01-18: 50 mg via ORAL
  Filled 2013-01-18: qty 1

## 2013-01-18 MED ORDER — ONDANSETRON HCL 4 MG/2ML IJ SOLN
INTRAMUSCULAR | Status: DC | PRN
  Administered 2013-01-18: 4 mg via INTRAMUSCULAR

## 2013-01-18 MED ORDER — NEOSTIGMINE METHYLSULFATE 1 MG/ML IJ SOLN
INTRAMUSCULAR | Status: DC | PRN
  Administered 2013-01-18: 2 mg via INTRAVENOUS

## 2013-01-18 MED ORDER — MUPIROCIN 2 % EX OINT
TOPICAL_OINTMENT | CUTANEOUS | Status: AC
  Administered 2013-01-18: 1 via NASAL
  Filled 2013-01-18: qty 22

## 2013-01-18 MED ORDER — LACTATED RINGERS IV SOLN
INTRAVENOUS | Status: DC
  Administered 2013-01-18: 10:00:00 via INTRAVENOUS

## 2013-01-18 SURGICAL SUPPLY — 27 items
BUR CROSS CUT FISSURE 1.6 (BURR) ×2 IMPLANT
BUR EGG ELITE 4.0 (BURR) ×1 IMPLANT
CANISTER SUCTION 2500CC (MISCELLANEOUS) ×2 IMPLANT
CLOTH BEACON ORANGE TIMEOUT ST (SAFETY) ×2 IMPLANT
COVER SURGICAL LIGHT HANDLE (MISCELLANEOUS) ×2 IMPLANT
CRADLE DONUT ADULT HEAD (MISCELLANEOUS) ×2 IMPLANT
DECANTER SPIKE VIAL GLASS SM (MISCELLANEOUS) ×2 IMPLANT
GAUZE PACKING FOLDED 2  STR (GAUZE/BANDAGES/DRESSINGS) ×1
GAUZE PACKING FOLDED 2 STR (GAUZE/BANDAGES/DRESSINGS) ×1 IMPLANT
GLOVE BIO SURGEON STRL SZ 6.5 (GLOVE) ×2 IMPLANT
GLOVE BIO SURGEON STRL SZ7.5 (GLOVE) ×2 IMPLANT
GLOVE BIOGEL PI IND STRL 7.0 (GLOVE) ×1 IMPLANT
GLOVE BIOGEL PI INDICATOR 7.0 (GLOVE) ×1
GOWN STRL NON-REIN LRG LVL3 (GOWN DISPOSABLE) ×2 IMPLANT
GOWN STRL REIN XL XLG (GOWN DISPOSABLE) ×2 IMPLANT
KIT BASIN OR (CUSTOM PROCEDURE TRAY) ×2 IMPLANT
KIT ROOM TURNOVER OR (KITS) ×2 IMPLANT
NEEDLE 22X1 1/2 (OR ONLY) (NEEDLE) ×2 IMPLANT
NS IRRIG 1000ML POUR BTL (IV SOLUTION) ×2 IMPLANT
PAD ARMBOARD 7.5X6 YLW CONV (MISCELLANEOUS) ×4 IMPLANT
SUT CHROMIC 3 0 PS 2 (SUTURE) ×3 IMPLANT
SYR CONTROL 10ML LL (SYRINGE) ×2 IMPLANT
TOWEL OR 17X26 10 PK STRL BLUE (TOWEL DISPOSABLE) ×2 IMPLANT
TRAY ENT MC OR (CUSTOM PROCEDURE TRAY) ×2 IMPLANT
TUBING IRRIGATION (MISCELLANEOUS) IMPLANT
WATER STERILE IRR 1000ML POUR (IV SOLUTION) IMPLANT
YANKAUER SUCT BULB TIP NO VENT (SUCTIONS) ×2 IMPLANT

## 2013-01-18 NOTE — Anesthesia Preprocedure Evaluation (Addendum)
Anesthesia Evaluation  Patient identified by MRN, date of birth, ID band Patient awake    Reviewed: Allergy & Precautions, H&P , NPO status , Patient's Chart, lab work & pertinent test results  Airway       Dental  (+) Missing and Poor Dentition   Pulmonary          Cardiovascular hypertension, Rhythm:Regular Rate:Normal     Neuro/Psych PSYCHIATRIC DISORDERS  Neuromuscular disease    GI/Hepatic   Endo/Other  diabetes, Type 2, Insulin Dependent  Renal/GU      Musculoskeletal   Abdominal   Peds  Hematology  (+) Blood dyscrasia, , HIV,   Anesthesia Other Findings   Reproductive/Obstetrics                         Anesthesia Physical Anesthesia Plan  ASA: III  Anesthesia Plan: General   Post-op Pain Management:    Induction: Intravenous  Airway Management Planned: Nasal ETT  Additional Equipment:   Intra-op Plan:   Post-operative Plan: Extubation in OR  Informed Consent: I have reviewed the patients History and Physical, chart, labs and discussed the procedure including the risks, benefits and alternatives for the proposed anesthesia with the patient or authorized representative who has indicated his/her understanding and acceptance.     Plan Discussed with: CRNA, Anesthesiologist and Surgeon  Anesthesia Plan Comments:         Anesthesia Quick Evaluation

## 2013-01-18 NOTE — Progress Notes (Signed)
Patient states he will keep cell phone. Patient notified that [place] cannot be responsible for same

## 2013-01-18 NOTE — Anesthesia Procedure Notes (Signed)
Procedure Name: Intubation Date/Time: 01/18/2013 10:49 AM Performed by: Barrington Ellison Pre-anesthesia Checklist: Patient identified, Emergency Drugs available, Suction available, Patient being monitored and Timeout performed Patient Re-evaluated:Patient Re-evaluated prior to inductionOxygen Delivery Method: Circle system utilized Preoxygenation: Pre-oxygenation with 100% oxygen Intubation Type: IV induction Ventilation: Mask ventilation without difficulty and Oral airway inserted - appropriate to patient size Laryngoscope Size: Mac and 3 Grade View: Grade I Nasal Tubes: Right, Nasal Rae, Nasal prep performed and Magill forceps- large, utilized Tube size: 7.0 mm Number of attempts: 1 Placement Confirmation: ETT inserted through vocal cords under direct vision,  positive ETCO2 and breath sounds checked- equal and bilateral Secured at: 27 cm Tube secured with: Tape Dental Injury: Teeth and Oropharynx as per pre-operative assessment

## 2013-01-18 NOTE — Preoperative (Signed)
Beta Blockers   Reason not to administer Beta Blockers:Not Applicable 

## 2013-01-18 NOTE — Op Note (Signed)
01/18/2013  11:32 AM  PATIENT:  Keith [name]  47 y.o. male  PRE-OPERATIVE DIAGNOSIS:  NON-RESTORABLE TEETH #3, 6, 7, 10, 11, 13, 21, 22, 27, 28, 29, 30  POST-OPERATIVE DIAGNOSIS:  SAME  PROCEDURE:  Procedure(s): MULTIPLE EXTRACION 3, 6, 7, 10, 11, 13, 21, 22, 27, 28, 29, 30 WITH ALVEOLOPLASTY  SURGEON:  Surgeon(s): Gae Bon, DDS  ANESTHESIA:   local and general  EBL:  minimal  DRAINS: none   SPECIMEN:  No Specimen  COUNTS:  YES  PLAN OF CARE: Discharge to home after PACU  PATIENT DISPOSITION:  PACU - hemodynamically stable.   PROCEDURE DETAILS: Dictation [place]:[id number]  Gae Bon, DMD 01/18/2013 11:32 AM

## 2013-01-18 NOTE — Anesthesia Postprocedure Evaluation (Signed)
  Anesthesia Post-op Note  Patient: Keith [name]  Procedure(s) Performed: Procedure(s): MULTIPLE EXTRACION 3, 6, 7, 10, 11, 13, 21, 22, 27, 28, 29, 30 WITH ALVEOLOPLASTY (N/A)  Patient Location: PACU  Anesthesia Type:General  Level of Consciousness: awake, alert , oriented and patient cooperative  Airway and Oxygen Therapy: Patient Spontanous Breathing  Post-op Pain: mild  Post-op Assessment: Post-op Vital signs reviewed, Patient's Cardiovascular Status Stable, Respiratory Function Stable, Patent Airway, No signs of Nausea or vomiting and Pain level controlled  Post-op Vital Signs: stable  Complications: No apparent anesthesia complications

## 2013-01-18 NOTE — OR Nursing (Addendum)
Throat pack in @1056 , throat out @1125 

## 2013-01-18 NOTE — Progress Notes (Signed)
Spoke with Jenny Reichmann in Pharmacy they will send beta blocker ASAP

## 2013-01-18 NOTE — H&P (Signed)
H&P documentation  -History and Physical Reviewed  -Patient has been re-examined  -No change in the plan of care  Keith [name]  

## 2013-01-18 NOTE — Progress Notes (Signed)
Pt's BP elevated to 160/112,, pt states he did not take either of his Bp meds this am, Dr.Smith updated and no new orders rec'd, pt updated on BP status and instructed to cont his blood pressure medications at home, will cont to assess

## 2013-01-18 NOTE — Transfer of Care (Signed)
Immediate Anesthesia Transfer of Care Note  Patient: Keith [name]  Procedure(s) Performed: Procedure(s): MULTIPLE EXTRACION 3, 6, 7, 10, 11, 13, 21, 22, 27, 28, 29, 30 WITH ALVEOLOPLASTY (N/A)  Patient Location: PACU  Anesthesia Type:General  Level of Consciousness: awake, oriented, patient cooperative and lethargic  Airway & Oxygen Therapy: Patient Spontanous Breathing and Patient connected to face mask oxygen  Post-op Assessment: Report given to PACU RN and Patient moving all extremities  Post vital signs: Reviewed and stable  Complications: No apparent anesthesia complications

## 2013-01-19 NOTE — Op Note (Signed)
NAMEJAYCEAN, Keith [name]                ACCOUNT NO.:  [id number]  MEDICAL RECORD NO.:  [id number]  LOCATION:  [facility]                         FACILITY:  [facility]  PHYSICIAN:  Gae Bon, M.D.  DATE OF BIRTH:  [birth year]  DATE OF PROCEDURE:  01/18/2013 DATE OF DISCHARGE:  01/18/2013                              OPERATIVE REPORT   PREOPERATIVE DIAGNOSIS:  Nonrestorable teeth numbers 3, 6, 7, 10, 11, 13, 21, 22, 27, 28, 29, 30.  POSTOPERATIVE DIAGNOSIS:  Nonrestorable teeth numbers 3, 6, 7, 10, 11, 13, 21, 22, 27, 28, 29, 30.  PROCEDURE:  Extraction of teeth numbers 3, 6, 7, 10, 11, 13, 21, 22, 27, 28, 29, 30.  Alveoplasty right and left maxilla and mandible.  SURGEON:  Gae Bon, M.D.  ANESTHESIA:  General.  Dr. Tamala Julian, attending, nasal intubation.  INDICATIONS FOR PROCEDURE:  Keith [name] is a 47 year old male who was referred to me by his general dentist for removal of all remaining teeth.  Past medical history significant for HIV, diabetes, hypertension, osteomyelitis, chronic pain, and AIDS.  Because of the number of teeth to be removed and the need for adequate anesthesia, it was recommended that the patient undergo the procedure with general anesthesia and intubation for airway protection.  DESCRIPTION OF PROCEDURE:  The patient was taken to the operating room, placed on the table in supine position.  General anesthesia was administered intravenously and a nasal endotracheal tube was placed atraumatically.  The eyes were protected.  The patient was draped for the procedure and time-out was performed.  The posterior pharynx was then suctioned.  A throat pack was placed and then the patient was anesthetized using 2% lidocaine and 1:100,000 epinephrine in an inferior alveolar block on the right and left side and a buccal and palatal infiltration in the maxilla on the right and left side, a total of 18 mL was utilized.  A bite block was placed in the right side of the mouth and a  sweetheart retractor was used to retract the tongue.  A 15 blade was used to make an incision around teeth numbers 21 and 22, with a proximal and distal extension and a wedge excision in the mandible.  In the maxilla, a 15 blade was used to make an incision around teeth numbers 10, 11, 13 with a 1 cm extension and wedge excision to allow for primary closure.  Then, the teeth were elevated with a 301 elevator. The lower teeth were removed with the Asch forceps and the upper teeth were removed using the #150 forceps.  The sockets were curetted and the periosteum was further reflected to expose the alveolar crest and then the egg-shaped bur and bone file were used to perform the alveoplasty. Then these areas were irrigated and closed with 3-0 chromic.  The bite block and sweetheart retractor were repositioned to the other side of the mouth and a 15 blade was used to make an incision around teeth numbers 3, 6, 7, and the maxilla with a 1 cm extension and a wedge resection and then in the mandible.  A 15 blade was used to make an incision around teeth numbers 27, 28, 29 and 30,  with a 1 cm extension of incisions with wedge resection to allow for primary closure.  Then, the 301 elevator was used to elevate these teeth.  The upper teeth were removed with the universal #150 forceps.  The lower teeth were removed with Asch forceps with the exception of teeth numbers 27 and 28.  These teeth required additional bone removal prior to being removed.  Then the sockets were curetted and then the alveolar crest was exposed in the maxilla and mandible and the egg-shaped bur and bone file were used to perform alveoplasty.  Then these areas were irrigated and closed with 3- 0 chromic.  The oral cavity was inspected and found to have good contour and hemostasis and closure.  The oral cavity was irrigated and suctioned.  The throat pack was removed.  The patient was awakened and taken to the recovery room,  breathing spontaneously in good condition.  EBL:  Minimum.  COMPLICATIONS:  None.  SPECIMENS:  None     Gae Bon, M.D.     SMJ/MEDQ  D:  01/18/2013  T:  01/19/2013  Job:  [id number]

## 2013-01-20 ENCOUNTER — Encounter (HOSPITAL_COMMUNITY): Payer: Self-pay | Admitting: Oral Surgery

## 2013-01-29 NOTE — Addendum Note (Signed)
Addended by: Hulan Fray on: 01/29/2013 07:01 AM   Modules accepted: Orders

## 2013-02-26 IMAGING — CR DG CHEST 2V
2 series · 2 of 2 positions shown · non-contrast
Comparison: 11/10/2011

CLINICAL DATA: Cough, shortness of breath

CHEST - 2 VIEW

[w chest pa]
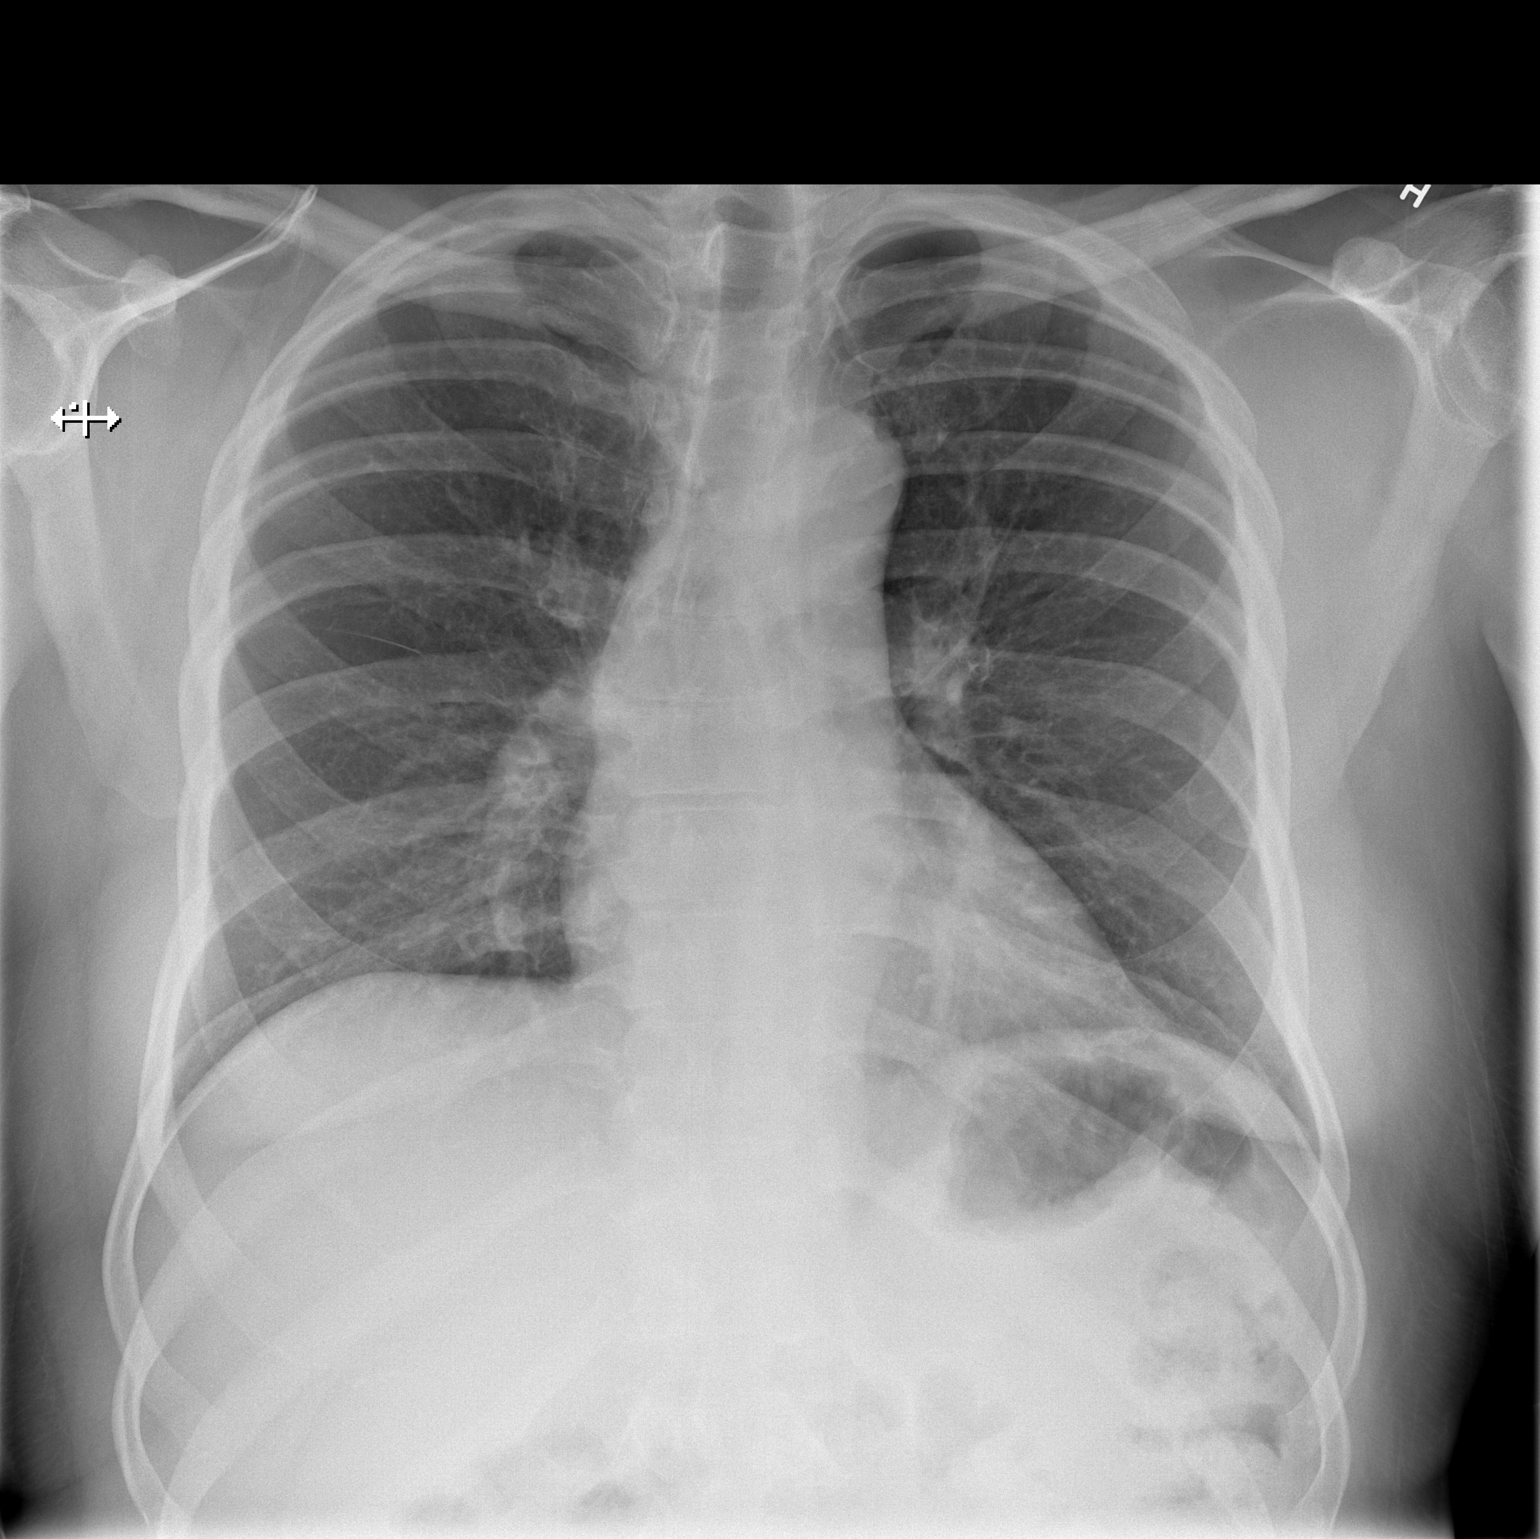

[w chest lat]
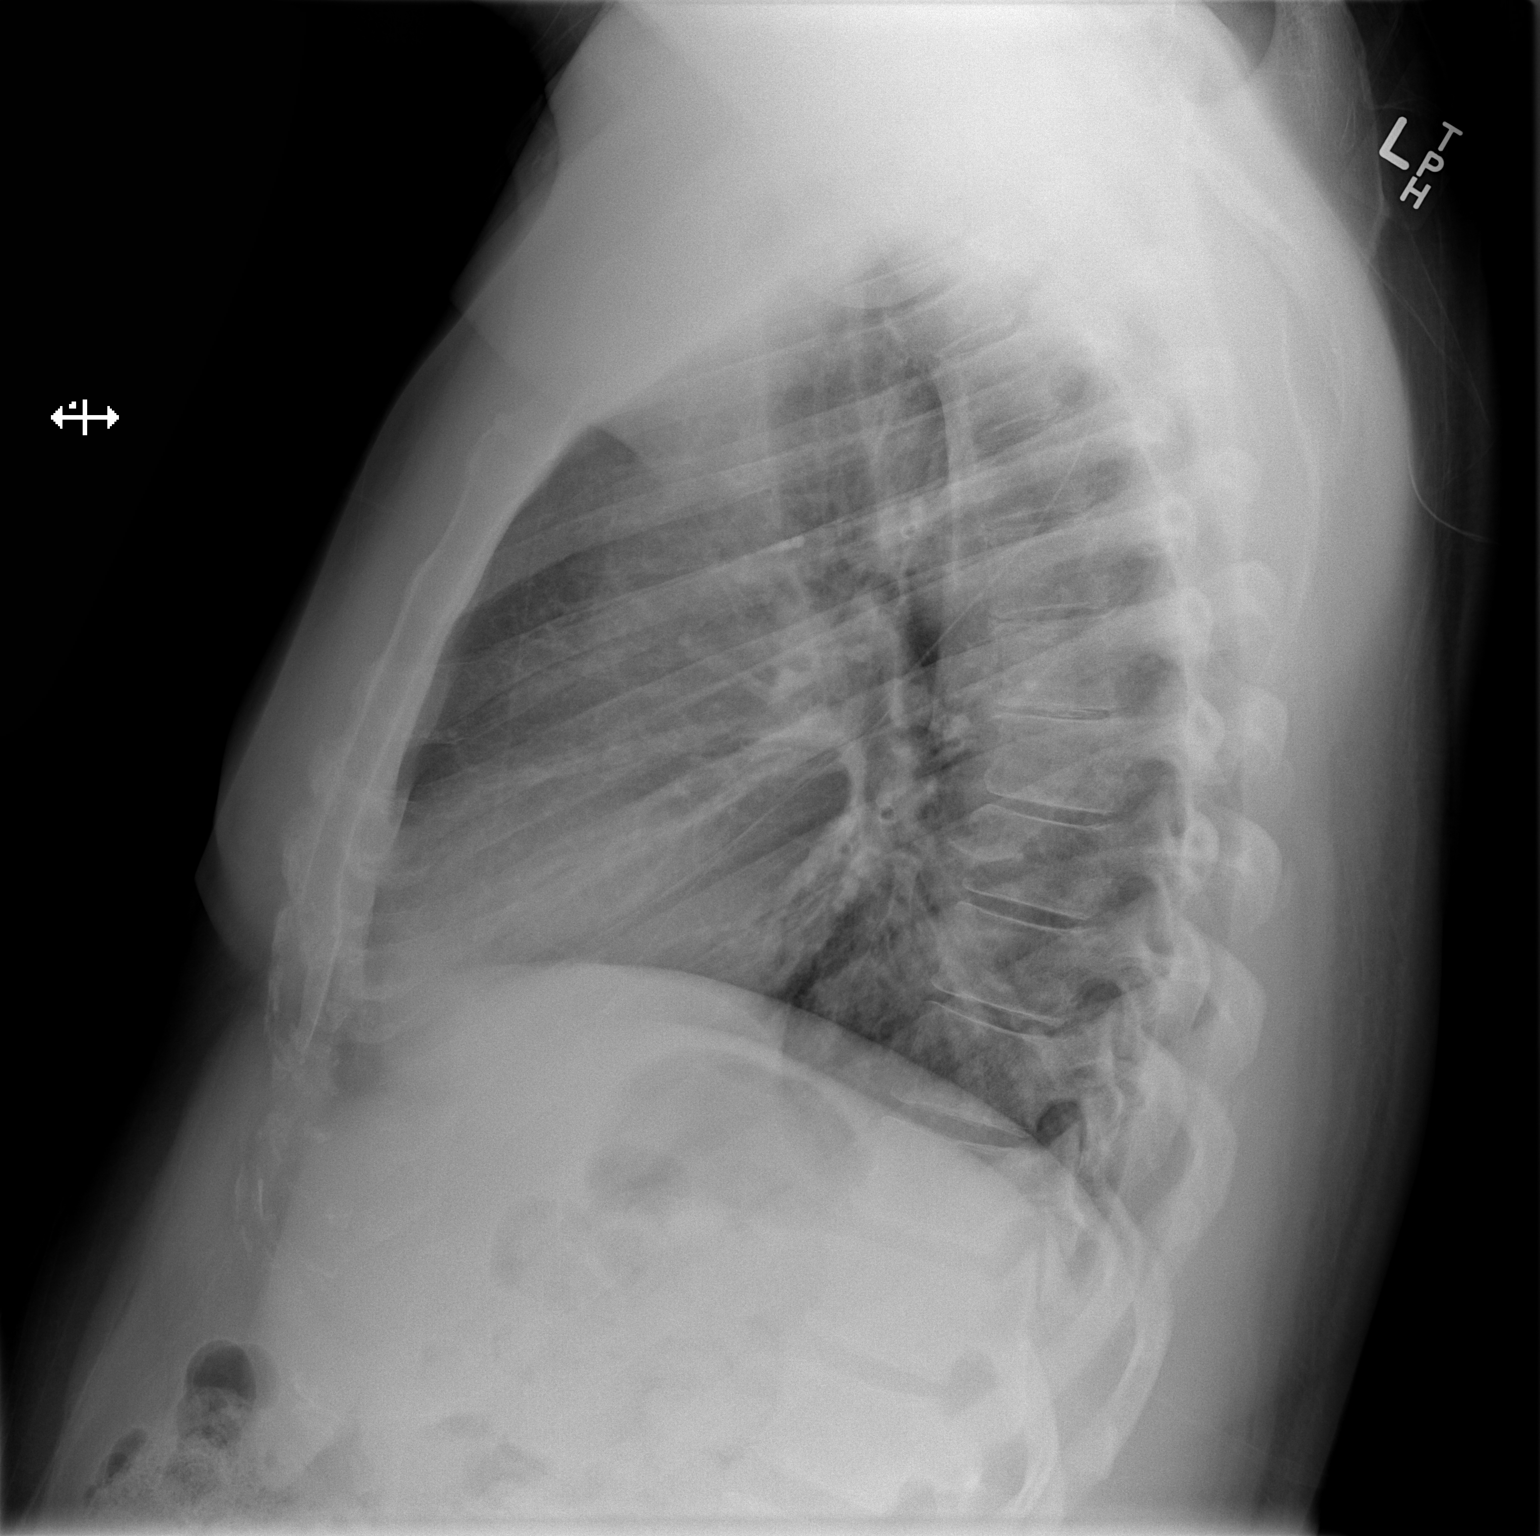

[2 of 2 positions shown; findings below may reference images not displayed]

FINDINGS: Cardiomediastinal silhouette is stable.  No acute
infiltrate or pleural effusion.  No pulmonary edema.  Bony thorax
is stable.
IMPRESSION: No active disease.  No significant change.

## 2013-04-23 ENCOUNTER — Encounter: Payer: Self-pay | Admitting: Internal Medicine

## 2013-04-23 ENCOUNTER — Encounter: Payer: Medicaid Other | Admitting: Internal Medicine

## 2013-04-30 ENCOUNTER — Other Ambulatory Visit: Payer: Self-pay | Admitting: Infectious Diseases

## 2013-05-04 ENCOUNTER — Ambulatory Visit (INDEPENDENT_AMBULATORY_CARE_PROVIDER_SITE_OTHER): Payer: Medicaid Other | Admitting: Internal Medicine

## 2013-05-04 ENCOUNTER — Telehealth: Payer: Self-pay | Admitting: *Deleted

## 2013-05-04 ENCOUNTER — Encounter: Payer: Self-pay | Admitting: Internal Medicine

## 2013-05-04 VITALS — BP 133/92 | HR 103 | Temp 97.2°F | Ht 69.0 in | Wt 290.1 lb

## 2013-05-04 DIAGNOSIS — IMO0002 Reserved for concepts with insufficient information to code with codable children: Secondary | ICD-10-CM

## 2013-05-04 DIAGNOSIS — R059 Cough, unspecified: Secondary | ICD-10-CM

## 2013-05-04 DIAGNOSIS — R051 Acute cough: Secondary | ICD-10-CM | POA: Insufficient documentation

## 2013-05-04 DIAGNOSIS — E1165 Type 2 diabetes mellitus with hyperglycemia: Secondary | ICD-10-CM

## 2013-05-04 DIAGNOSIS — R05 Cough: Secondary | ICD-10-CM

## 2013-05-04 DIAGNOSIS — IMO0001 Reserved for inherently not codable concepts without codable children: Secondary | ICD-10-CM

## 2013-05-04 DIAGNOSIS — I10 Essential (primary) hypertension: Secondary | ICD-10-CM

## 2013-05-04 LAB — GLUCOSE, CAPILLARY: Glucose-Capillary: 135 mg/dL — ABNORMAL HIGH (ref 70–99)

## 2013-05-04 LAB — POCT GLYCOSYLATED HEMOGLOBIN (HGB A1C): Hemoglobin A1C: 5.6

## 2013-05-04 NOTE — Patient Instructions (Signed)
-  Please stop taking Lisinopril for the next week, and we will decide what to do next when you return - please pay attention to if your cough is improving or not.  Please be sure to bring all of your medications with you to every visit.  Should you have any new or worsening symptoms, please be sure to call the clinic at [phone].

## 2013-05-04 NOTE — Progress Notes (Signed)
Subjective:   [name] male   DOB: 06-30-1965 48 y.o.   MRN: [name]  Chief Complaint  Patient presents with  . Cough    Has has nonproductive cough past 6 months.    HPI: Keith [name] is a 48 y.o. man with history of HIV, HTN, DM, HLD and chronic pain who presents for an acute visit.   Cough with chest pain and dizziness. Nonproductive sputum producing cough x 55months, sometimes to the point of almost vomiting, but never vomiting. No significant rhinorrhea. No sore throat. No fever. Possibly a bit of sinus congestion. No mouth sores. No sneezing. No ear pain/discharge. Cannot tell if he has a metallic taste in mouth. Denies heart burn. No sick contacts. Feels like he is clearing his throat a lot. Better if he bends his head down. Hasn't tried anything for the cough. No exacerbating factors.    Review of Systems: Constitutional: Denies fever, chills, diaphoresis, appetite change and fatigue.  HEENT: Denies photophobia, eye pain, redness, trouble swallowing, neck pain, neck stiffness and tinnitus.  Respiratory: Denies SOB, chest tightness. DOE for 6-7 months, no smoking. +wheezing Cardiovascular: Denies palpitations and leg swelling.  Gastrointestinal: Denies nausea, vomiting, abdominal pain, constipation,blood in stool and abdominal distention. 1 loose stool this AM. Genitourinary: Denies dysuria, urgency, frequency, hematuria, flank pain and difficulty urinating.  Skin: Denies pallor, rash and wound.  Neurological: Denies seizures, syncope, weakness, numbness and headaches.    Past Medical History  Diagnosis Date  . HIV (human immunodeficiency virus infection) 2009    CD4 count 100, VL 13800 (05/01/2010)  . Diabetes type 2, uncontrolled     HgA1c 17.6 (04/27/2010)  . Hypertension   . Genital warts   . Erectile dysfunction   . Chronic knee pain     right  . Osteomyelitis     h/o hand  . Diabetes mellitus   . Chronic pain   . AIDS    Current  Outpatient Prescriptions  Medication Sig Dispense Refill  . aspirin 81 MG EC tablet Take 81 mg by mouth every morning.      Marland Kitchen atenolol (TENORMIN) 50 MG tablet Take 50 mg by mouth every morning.      . dapsone 100 MG tablet Take 100 mg by mouth every morning.      . dapsone 100 MG tablet TAKE 1 TABLET BY MOUTH DAILY  30 tablet  0  . Darunavir Ethanolate (PREZISTA) 800 MG tablet Take 800 mg by mouth daily with breakfast.      . emtricitabine-tenofovir (TRUVADA) 200-300 MG per tablet Take 1 tablet by mouth every morning.      Marland Kitchen glipiZIDE (GLUCOTROL XL) 5 MG 24 hr tablet Take 1 tablet (5 mg total) by mouth every morning.  30 tablet  11  . insulin glargine (LANTUS) 100 UNIT/ML injection Inject 0.5 mLs (50 Units total) into the skin at bedtime.  10 mL  12  . Insulin Pen Needle 31G X 5 MM MISC 1 application by Does not apply route once. diag code 250.02. Insulin dependent. Used for insulin inj 1 time daily  100 each  6  . Insulin Syringes, Disposable, U-100 1 ML MISC 1 Syringe by Does not apply route 2 (two) times daily.  100 each  0  . ISENTRESS 400 MG tablet TAKE 1 TABLET BY MOUTH TWICE DAILY  60 tablet  0  . lisinopril (PRINIVIL,ZESTRIL) 40 MG tablet Take 1 tablet (40 mg total) by mouth every morning.  30 tablet  12  . metFORMIN (GLUCOPHAGE) 500 MG tablet Take 500 mg by mouth 2 (two) times daily with a meal.      . NORVIR 100 MG TABS tablet TAKE 1 TABLET BY MOUTH DAILY WITH BREAKFAST  30 tablet  0  . Oxycodone HCl (ROXICODONE) 10 MG TABS Take 1 tablet (10 mg total) by mouth every 4 (four) hours as needed for pain.  40 tablet  0  . pravastatin (PRAVACHOL) 40 MG tablet Take 1 tablet (40 mg total) by mouth every evening.  30 tablet  11  . PREZISTA 800 MG tablet TAKE 1 TABLET BY MOUTH DAILY WITH BREAKFAST  30 tablet  0  . raltegravir (ISENTRESS) 400 MG tablet Take 400 mg by mouth 2 (two) times daily.      . ritonavir (NORVIR) 100 MG capsule Take 100 mg by mouth daily.      Marland Kitchen testosterone (ANDROGEL) 50  MG/5GM GEL Place 5 g onto the skin every evening.       . TRUVADA 200-300 MG per tablet TAKE 1 TABLET BY MOUTH DAILY  30 tablet  0   No current facility-administered medications for this visit.   Family History  Problem Relation Age of Onset  . Hypertension Mother   . Arthritis Father   . Hypertension Father   . Hypertension Brother   . Cancer Maternal Grandmother 44    unknown type of cancer  . Depression Paternal Grandmother    History   Social History  . Marital Status: Single    Spouse Name: N/A    Number of Children: 52  . Years of Education: 70   Social History Main Topics  . Smoking status: Never Smoker   . Smokeless tobacco: Never Used  . Alcohol Use: No  . Drug Use: No  . Sexual Activity: Yes    Partners: Female     Comment: given condoms   Other Topics Concern  . None   Social History Narrative   Worked for the city of [place] for 18 years.   Unemployed.    Applying for disability.   Medicaid patient.    Objective:  Physical Exam: Filed Vitals:   05/04/13 1358  BP: 133/92  Pulse: 103  Temp: 97.2 F (36.2 C)  TempSrc: Oral  Height: 5\' 9"  (1.753 m)  Weight: 290 lb 1.6 oz (131.588 kg)  SpO2: 98%   HEENT: PERRL, EOMI, no scleral icterus, no pharyngeal erythema, no cervical LAD, no sinus tenderness Cardiac: RRR, no rubs, murmurs or gallops Pulm: clear to auscultation bilaterally, moving normal volumes of air Abd: soft, nontender, nondistended, BS present Ext: warm and well perfused, no pedal edema Neuro: alert and oriented X3, cranial nerves II-XII grossly intact  Assessment & Plan:  Case and care discussed with Dr. Stann Mainland.  Please see problem oriented charting for further details.

## 2013-05-04 NOTE — Assessment & Plan Note (Addendum)
Dry cough x 6 months in this nonsmoker without pulmonary or HEENT findings on exam.  Start with trial off of ACEI x 1 week.  Per UpToDate, when ACEI is discontinued, improvement often occurs in 4-7 days.  If cough is improved at return visit on 05/14/13, officially d/c lisinopril, and start losartan (will expect elevated BP at follow up).  If no improvement, consider trial of PPI +/- CXR, and may still substitute losartan for lisinopril.

## 2013-05-04 NOTE — Assessment & Plan Note (Addendum)
Lab Results  Component Value Date   HGBA1C 5.6 05/04/2013   HGBA1C 10.2 12/18/2012   HGBA1C 15.0* 11/10/2011     Assessment: Diabetes control:  at goal Progress toward A1C goal:   improved  Plan: Medications:  continue current medications - metformin, glipizide, lantus Instruction/counseling given: reminded to bring blood glucose meter & log to each visit and reminded to bring medications to each visit Educational resources provided: brochure;handout

## 2013-05-04 NOTE — Telephone Encounter (Signed)
agree

## 2013-05-04 NOTE — Telephone Encounter (Signed)
Pt presented at front desk c/o 6 month history of cough, does not know if he has fevers. He states the cough sometimes makes him weak, dizzy and short of breath. He states it started after his hospitalization. And he states its been ongoing for 6 months. appt 1545 dr Burnard Bunting.

## 2013-05-04 NOTE — Assessment & Plan Note (Signed)
BP Readings from Last 3 Encounters:  05/04/13 133/92  01/18/13 180/101  01/18/13 180/101    Lab Results  Component Value Date   NA 133* 01/18/2013   K 4.1 01/18/2013   CREATININE 0.65 01/18/2013    Assessment: Blood pressure control:  mildly elevated (diastolic) Progress toward BP goal:   improved  Plan: Medications:  continue atenolol 50 daily, hold lisinopril 40 until follow up on 05/14/13, then consider starting losartan Educational resources provided: brochure;handout;video

## 2013-05-14 ENCOUNTER — Encounter: Payer: Medicaid Other | Admitting: Internal Medicine

## 2013-05-18 NOTE — Progress Notes (Signed)
Case discussed with Dr. Sharda soon after the resident saw the patient.  We reviewed the resident's history and exam and pertinent patient test results.  I agree with the assessment, diagnosis, and plan of care documented in the resident's note. 

## 2013-05-19 ENCOUNTER — Other Ambulatory Visit: Payer: Medicaid Other

## 2013-06-02 ENCOUNTER — Other Ambulatory Visit (HOSPITAL_COMMUNITY)
Admission: RE | Admit: 2013-06-02 | Discharge: 2013-06-02 | Disposition: A | Discharge status: Home / Self Care | Payer: Medicare Other | Source: Ambulatory Visit | Attending: Infectious Diseases | Admitting: Infectious Diseases

## 2013-06-02 ENCOUNTER — Ambulatory Visit (INDEPENDENT_AMBULATORY_CARE_PROVIDER_SITE_OTHER): Payer: Medicare Other | Admitting: Infectious Diseases

## 2013-06-02 ENCOUNTER — Encounter: Payer: Self-pay | Admitting: Infectious Diseases

## 2013-06-02 VITALS — BP 160/107 | HR 76 | Temp 97.9°F | Ht 69.0 in | Wt 293.0 lb

## 2013-06-02 DIAGNOSIS — B20 Human immunodeficiency virus [HIV] disease: Secondary | ICD-10-CM

## 2013-06-02 DIAGNOSIS — Z113 Encounter for screening for infections with a predominantly sexual mode of transmission: Secondary | ICD-10-CM

## 2013-06-02 DIAGNOSIS — E1165 Type 2 diabetes mellitus with hyperglycemia: Secondary | ICD-10-CM

## 2013-06-02 DIAGNOSIS — IMO0001 Reserved for inherently not codable concepts without codable children: Secondary | ICD-10-CM

## 2013-06-02 DIAGNOSIS — IMO0002 Reserved for concepts with insufficient information to code with codable children: Secondary | ICD-10-CM

## 2013-06-02 DIAGNOSIS — Z23 Encounter for immunization: Secondary | ICD-10-CM

## 2013-06-02 LAB — COMPREHENSIVE METABOLIC PANEL
ALT: 10 U/L (ref 0–53)
AST: 16 U/L (ref 0–37)
Albumin: 4.2 g/dL (ref 3.5–5.2)
Alkaline Phosphatase: 59 U/L (ref 39–117)
BUN: 10 mg/dL (ref 6–23)
CO2: 27 mEq/L (ref 19–32)
Calcium: 9.7 mg/dL (ref 8.4–10.5)
Chloride: 103 mEq/L (ref 96–112)
Creat: 0.82 mg/dL (ref 0.50–1.35)
Glucose, Bld: 106 mg/dL — ABNORMAL HIGH (ref 70–99)
Potassium: 4 mEq/L (ref 3.5–5.3)
Sodium: 139 mEq/L (ref 135–145)
Total Bilirubin: 0.3 mg/dL (ref 0.2–1.2)
Total Protein: 7.4 g/dL (ref 6.0–8.3)

## 2013-06-02 LAB — LIPID PANEL
Cholesterol: 268 mg/dL — ABNORMAL HIGH (ref 0–200)
HDL: 43 mg/dL (ref >39)
LDL Cholesterol: 186 mg/dL — ABNORMAL HIGH (ref 0–99)
Total CHOL/HDL Ratio: 6.2 Ratio
Triglycerides: 196 mg/dL — ABNORMAL HIGH (ref <150)
VLDL: 39 mg/dL (ref 0–40)

## 2013-06-02 LAB — CBC
HCT: 36.4 % — ABNORMAL LOW (ref 39.0–52.0)
Hemoglobin: 12.4 g/dL — ABNORMAL LOW (ref 13.0–17.0)
MCH: 28.2 pg (ref 26.0–34.0)
MCHC: 34.1 g/dL (ref 30.0–36.0)
MCV: 82.7 fL (ref 78.0–100.0)
Platelets: 274 10*3/uL (ref 150–400)
RBC: 4.4 MIL/uL (ref 4.22–5.81)
RDW: 13.2 % (ref 11.5–15.5)
WBC: 6.3 10*3/uL (ref 4.0–10.5)

## 2013-06-02 LAB — RPR

## 2013-06-02 NOTE — Addendum Note (Signed)
Addended by: Landis Gandy on: 06/02/2013 05:08 PM   Modules accepted: Orders

## 2013-06-02 NOTE — Addendum Note (Signed)
Addended by: Nekeshia Lenhardt C on: 06/02/2013 04:12 PM   Modules accepted: Orders

## 2013-06-02 NOTE — Assessment & Plan Note (Signed)
Appears to be doing well on his current ART. Will check his labs today. He is offered/refuses condoms. He got flu shot in fall. Needs to start Hep B series.

## 2013-06-02 NOTE — Progress Notes (Signed)
   Subjective:    [name], male    DOB: Feb 21, 1966, 48 y.o.   MRN: [name]  HPI 48 yo M with poorly controlled DM, HIV (non-compliance) and narcotic use.  ART should be: ISN/TRV/DRVr at that time (he has drug resistant virus).  Denies problems taking his ART.  FSG have been good, last A1C <6%. Has been watching his diet. Had cataract surgery 6 months ago.  Needs to go pick up his dentures.   HIV 1 RNA Quant (copies/mL)  Date Value  11/20/2012 <20   07/20/2012 54788*  11/11/2011 304*     CD4 T Cell Abs (cmm)  Date Value  11/20/2012 100*  07/20/2012 50*  07/24/2011 30*    Review of Systems  Constitutional: Negative for appetite change and unexpected weight change.  Eyes: Negative for visual disturbance.  Gastrointestinal: Negative for diarrhea and constipation.  Genitourinary: Negative for difficulty urinating.  Neurological: Negative for numbness.       Objective:   Physical Exam  Constitutional: He appears well-developed and well-nourished.  HENT:  Mouth/Throat: No oropharyngeal exudate.  Eyes: EOM are normal. Pupils are equal, round, and reactive to light.  Neck: Neck supple.  Cardiovascular: Normal rate, regular rhythm and normal heart sounds.   Pulmonary/Chest: Effort normal and breath sounds normal.  Abdominal: Soft. Bowel sounds are normal. He exhibits no distension. There is no tenderness.  Musculoskeletal: He exhibits no edema.  onychomycosis  Lymphadenopathy:    He has no cervical adenopathy.          Assessment & Plan:

## 2013-06-02 NOTE — Assessment & Plan Note (Signed)
He has IM f/u. Will get him in to see podiatry, cautioned multiple times about wearing tight shoes (getting callus on R lat foot).

## 2013-06-03 LAB — T-HELPER CELL (CD4) - (RCID CLINIC ONLY)
CD4 % Helper T Cell: 7 % — ABNORMAL LOW (ref 33–55)
CD4 T Cell Abs: 120 /uL — ABNORMAL LOW (ref 400–2700)

## 2013-06-03 LAB — URINE CYTOLOGY ANCILLARY ONLY
Chlamydia: NEGATIVE
Neisseria Gonorrhea: NEGATIVE

## 2013-06-04 LAB — HIV-1 RNA ULTRAQUANT REFLEX TO GENTYP+
HIV 1 RNA Quant: <20 copies/mL (ref <20)
HIV-1 RNA Quant, Log: <1.3 {Log} (ref <1.30)

## 2013-06-06 ENCOUNTER — Other Ambulatory Visit: Payer: Self-pay | Admitting: Infectious Diseases

## 2013-06-08 ENCOUNTER — Other Ambulatory Visit: Payer: Self-pay | Admitting: Internal Medicine

## 2013-06-12 LAB — HLA B*5701: HLA-B*5701 w/rflx HLA-B High: NEGATIVE

## 2013-06-15 IMAGING — CR DG CHEST 2V
2 series · 2 of 2 positions shown · non-contrast
Comparison: Two-view chest x-ray 03/27/2012, 04/06/2011,
12/09/2009.

CLINICAL DATA: Current history diabetes, presenting with
hyperglycemia.

CHEST - 2 VIEW

[w chest pa]
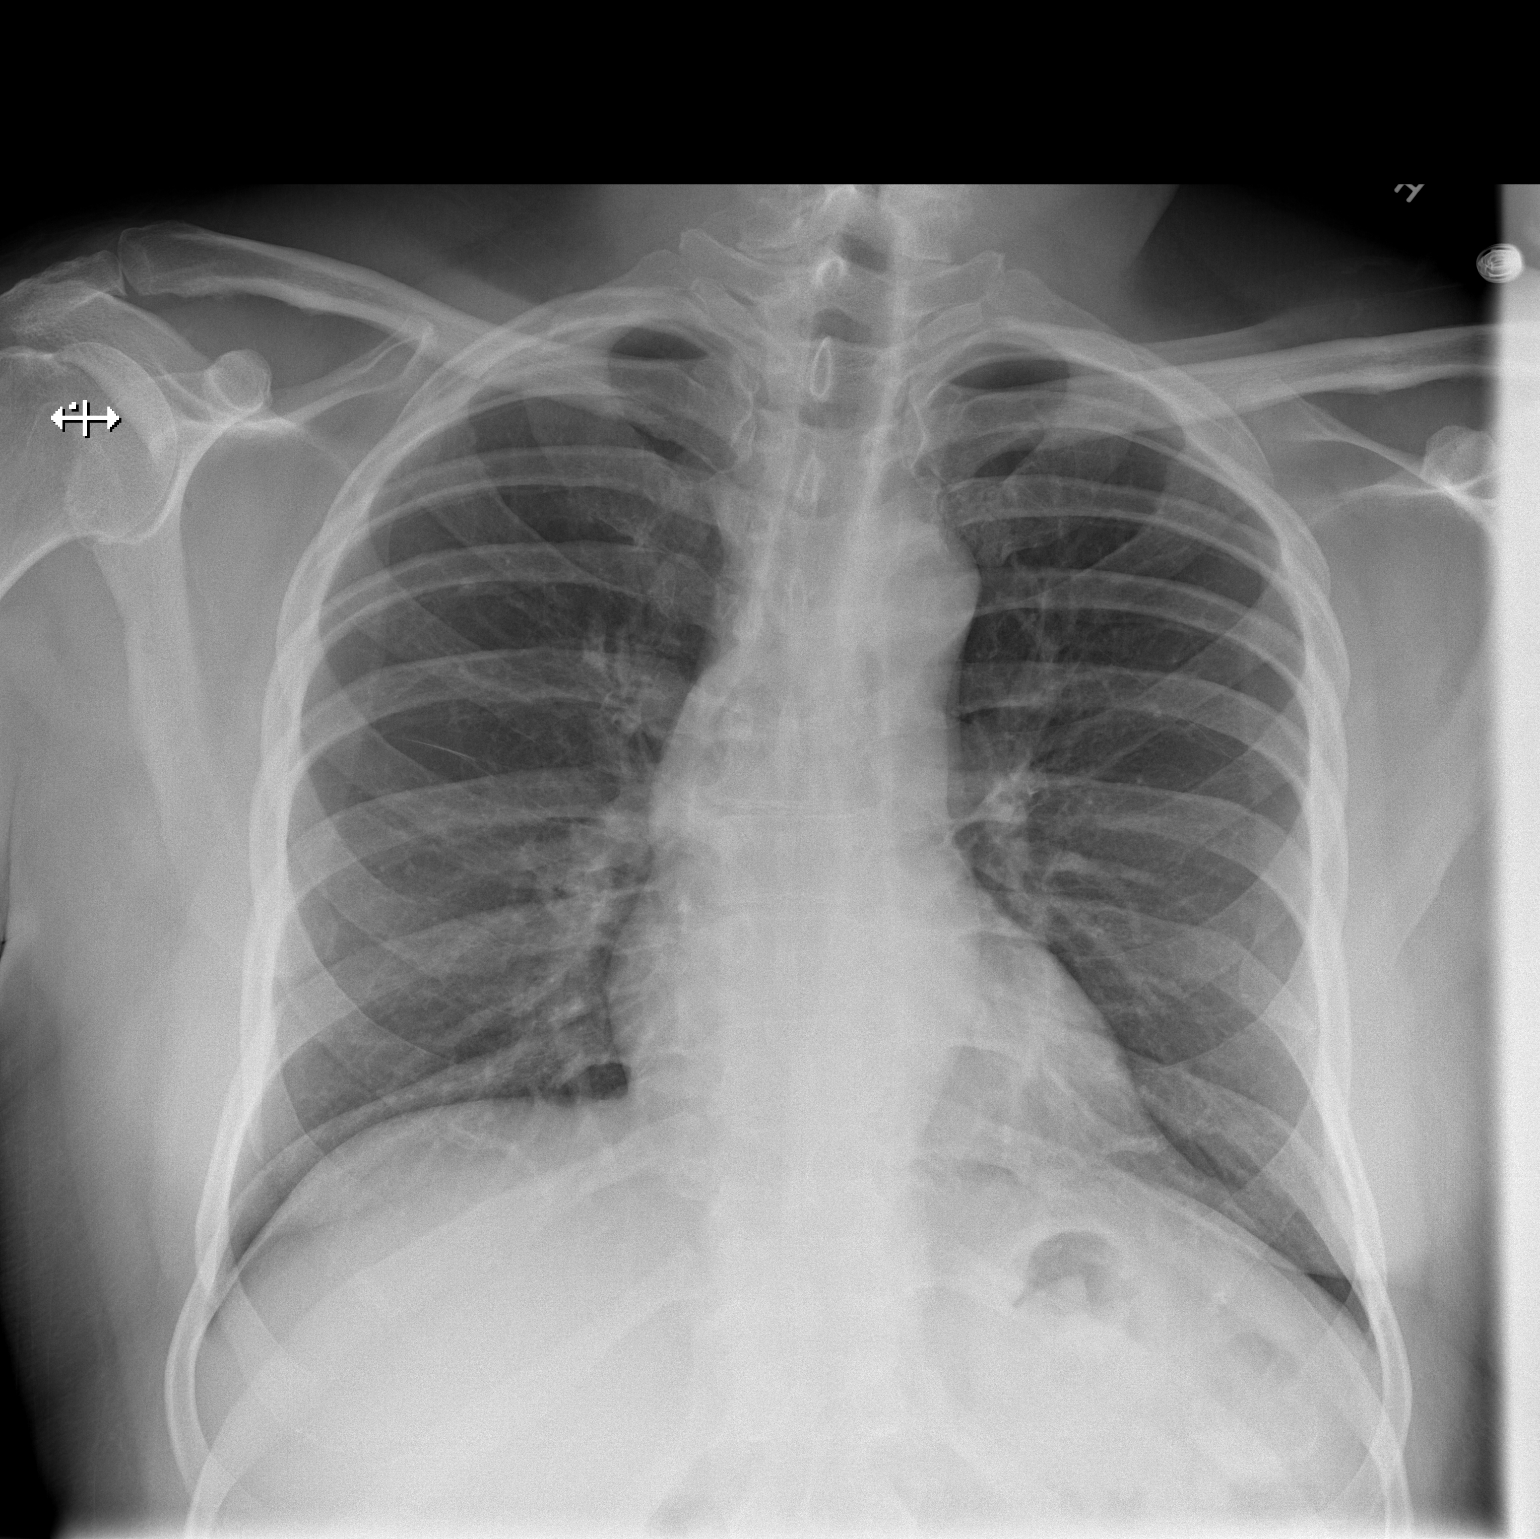

[w chest lat]
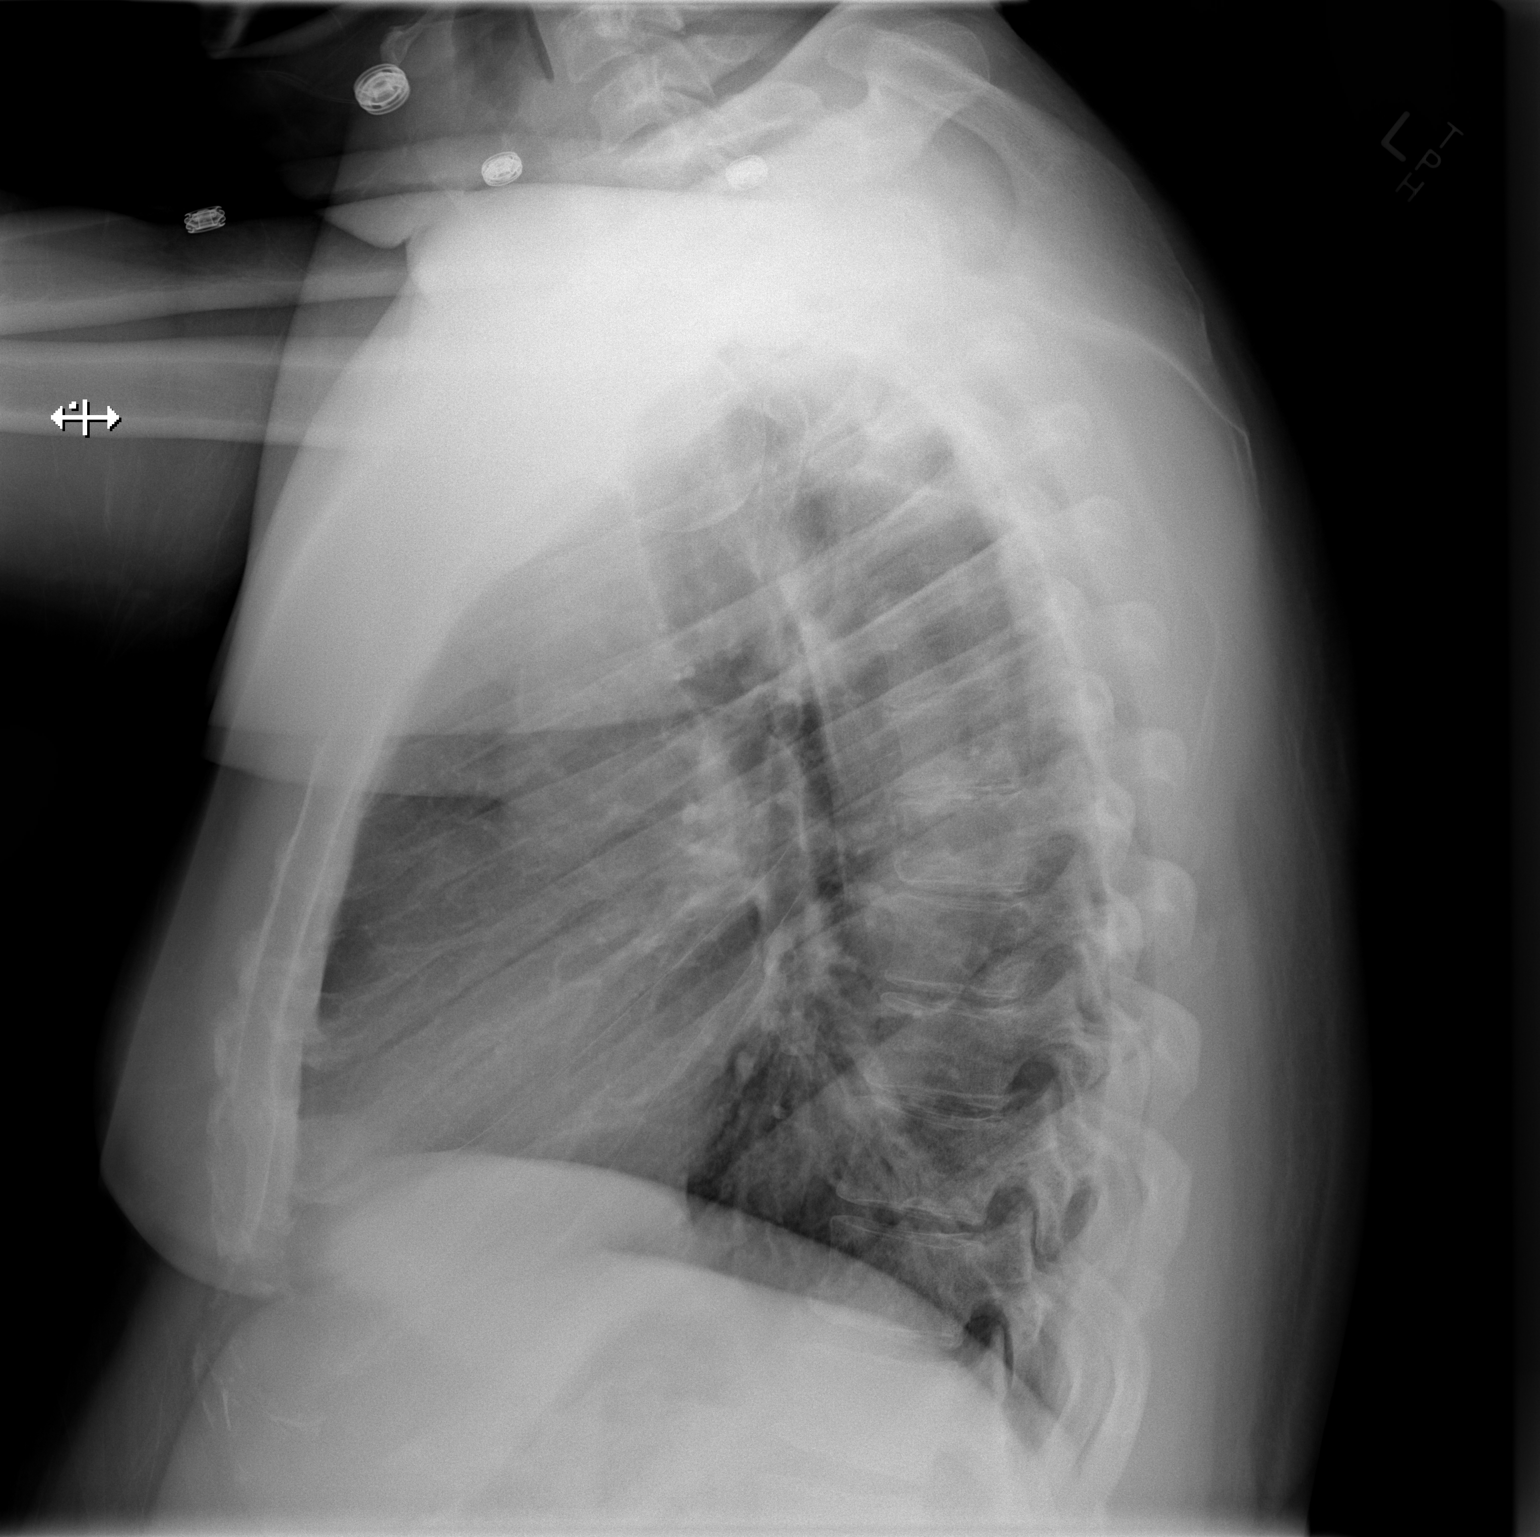

[2 of 2 positions shown; findings below may reference images not displayed]

FINDINGS: Cardiac silhouette normal in size, unchanged.  Thoracic
aorta mildly tortuous, unchanged.  Hilar and mediastinal contours
otherwise unremarkable.  Lungs clear.  Bronchovascular markings
normal.  Pulmonary vascularity normal.  No pneumothorax.  No
pleural effusions.  Visualized bony thorax intact.  No significant
interval change.
IMPRESSION: No acute cardiopulmonary disease.  Stable examination.

## 2013-06-24 IMAGING — CR DG KNEE COMPLETE 4+V*R*
4 series · 4 of 4 positions shown · non-contrast
Comparison: October 16, 2011.

CLINICAL DATA: Right knee pain after fall

RIGHT KNEE - COMPLETE 4+ VIEW

[t knee oblique right (1 of 2)]
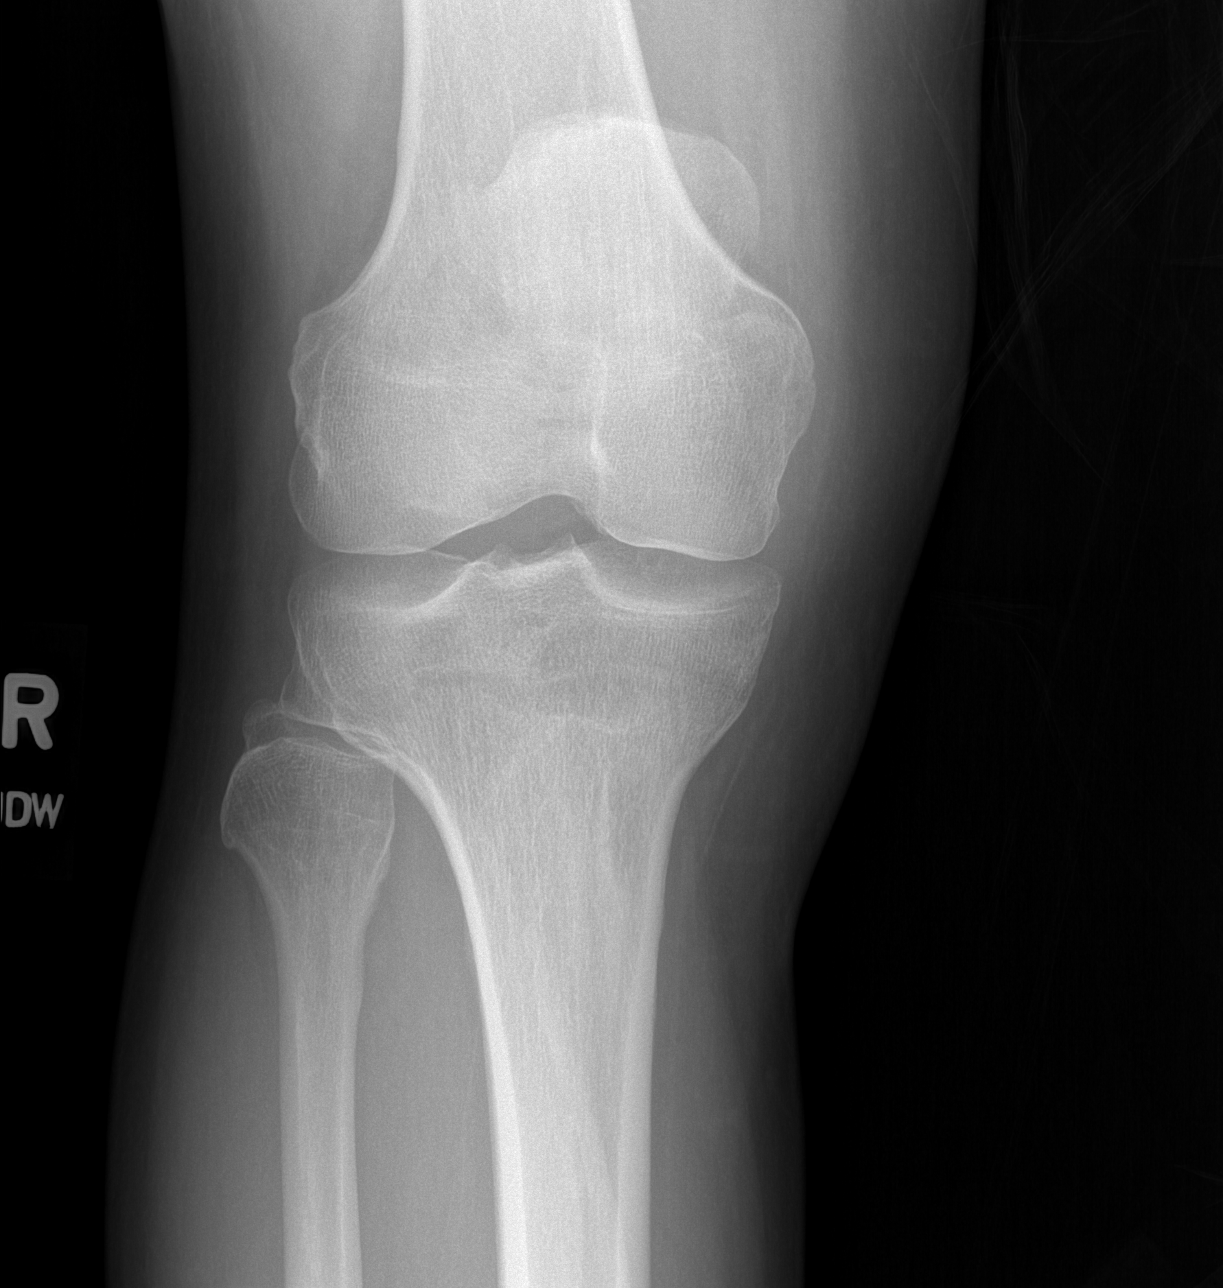

[t knee oblique right (2 of 2)]
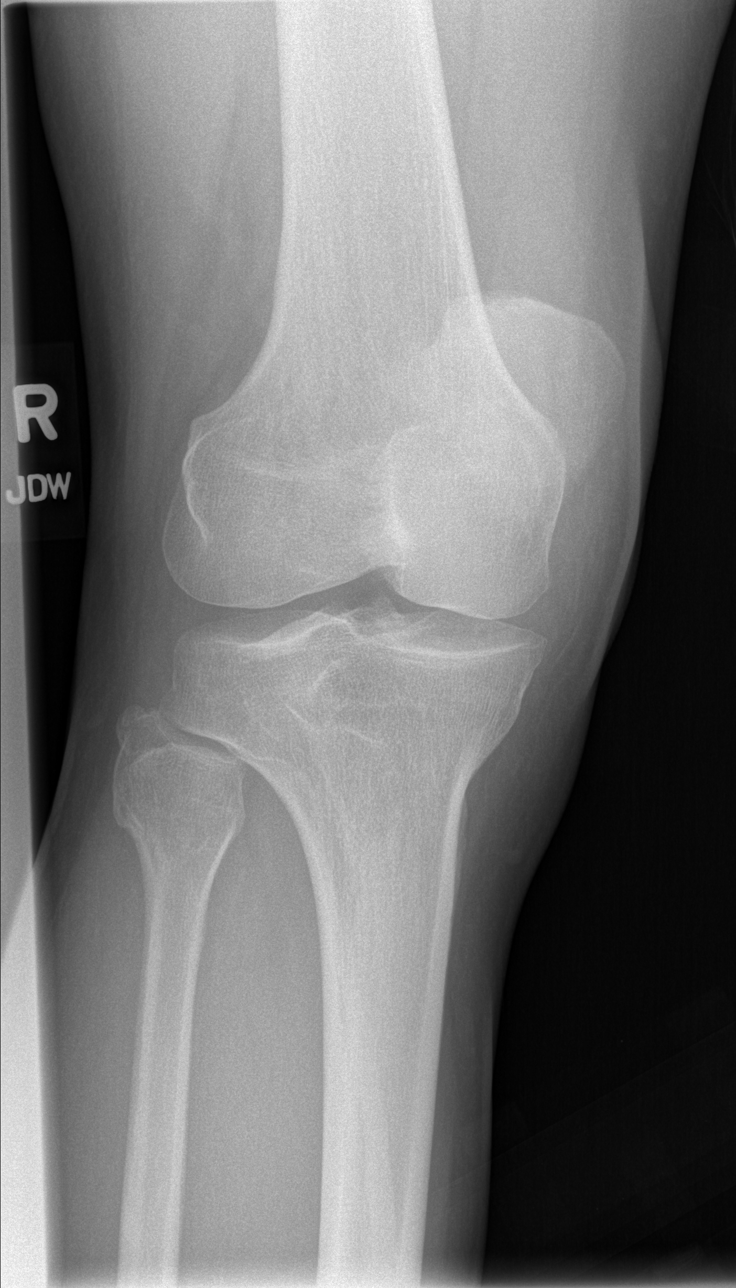

[t knee lat right (1 of 2)]
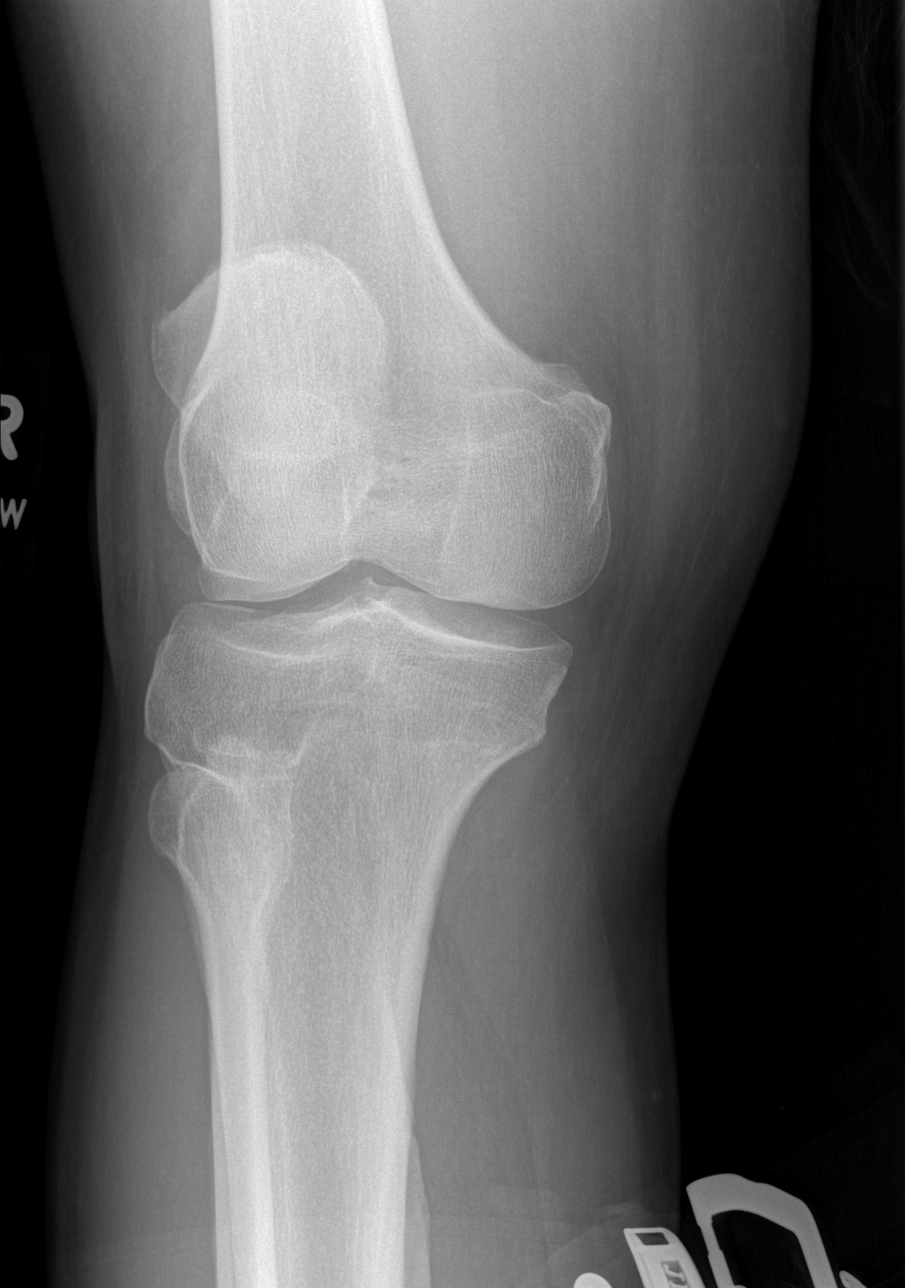

[t knee lat right (2 of 2)]
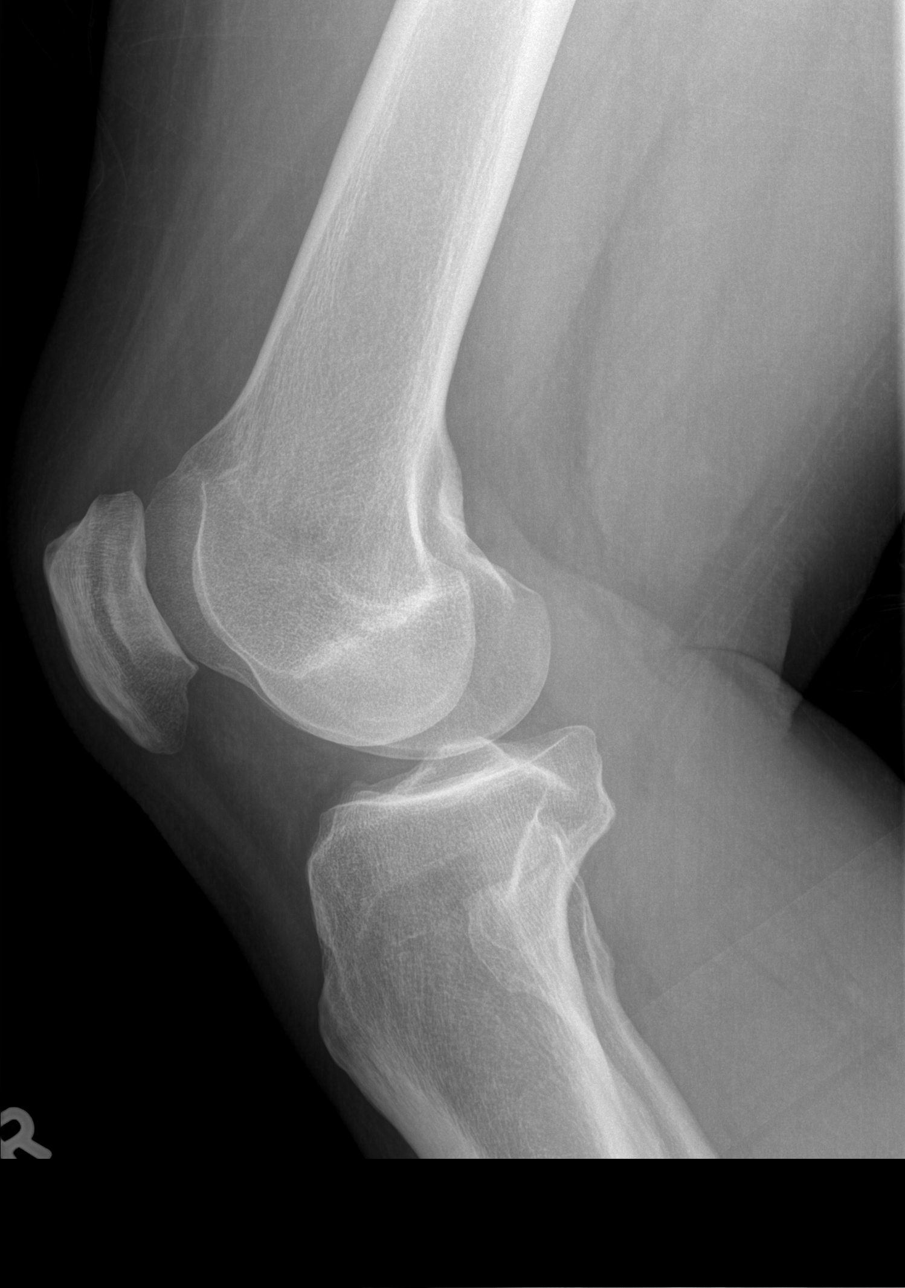

[4 of 4 positions shown; findings below may reference images not displayed]

FINDINGS: No fracture or dislocation is noted.  Joint spaces are
intact. No soft tissue abnormality is noted.
IMPRESSION: Normal right knee.

## 2013-06-30 ENCOUNTER — Ambulatory Visit: Payer: Medicaid Other

## 2013-07-07 ENCOUNTER — Other Ambulatory Visit: Payer: Self-pay | Admitting: *Deleted

## 2013-07-07 DIAGNOSIS — E118 Type 2 diabetes mellitus with unspecified complications: Secondary | ICD-10-CM

## 2013-07-14 ENCOUNTER — Encounter (HOSPITAL_COMMUNITY): Payer: Self-pay | Admitting: Emergency Medicine

## 2013-07-14 ENCOUNTER — Emergency Department (HOSPITAL_COMMUNITY): Payer: Medicare Other

## 2013-07-14 DIAGNOSIS — R197 Diarrhea, unspecified: Secondary | ICD-10-CM | POA: Insufficient documentation

## 2013-07-14 DIAGNOSIS — Z8619 Personal history of other infectious and parasitic diseases: Secondary | ICD-10-CM | POA: Insufficient documentation

## 2013-07-14 DIAGNOSIS — J069 Acute upper respiratory infection, unspecified: Secondary | ICD-10-CM | POA: Insufficient documentation

## 2013-07-14 DIAGNOSIS — E1165 Type 2 diabetes mellitus with hyperglycemia: Secondary | ICD-10-CM

## 2013-07-14 DIAGNOSIS — IMO0001 Reserved for inherently not codable concepts without codable children: Secondary | ICD-10-CM | POA: Insufficient documentation

## 2013-07-14 DIAGNOSIS — I10 Essential (primary) hypertension: Secondary | ICD-10-CM | POA: Insufficient documentation

## 2013-07-14 DIAGNOSIS — G8929 Other chronic pain: Secondary | ICD-10-CM | POA: Insufficient documentation

## 2013-07-14 DIAGNOSIS — Z7982 Long term (current) use of aspirin: Secondary | ICD-10-CM | POA: Insufficient documentation

## 2013-07-14 DIAGNOSIS — Z8739 Personal history of other diseases of the musculoskeletal system and connective tissue: Secondary | ICD-10-CM | POA: Insufficient documentation

## 2013-07-14 DIAGNOSIS — Z21 Asymptomatic human immunodeficiency virus [HIV] infection status: Secondary | ICD-10-CM | POA: Insufficient documentation

## 2013-07-14 DIAGNOSIS — Z79899 Other long term (current) drug therapy: Secondary | ICD-10-CM | POA: Insufficient documentation

## 2013-07-14 DIAGNOSIS — Z794 Long term (current) use of insulin: Secondary | ICD-10-CM | POA: Insufficient documentation

## 2013-07-14 DIAGNOSIS — Z87448 Personal history of other diseases of urinary system: Secondary | ICD-10-CM | POA: Insufficient documentation

## 2013-07-14 NOTE — ED Notes (Signed)
The pt has had a cough for 3 days and he has had some sweating. And he has pain in his rt abd he calls it his wind pipe.  The pain starts when he coughs

## 2013-07-15 ENCOUNTER — Emergency Department (HOSPITAL_COMMUNITY)
Admission: EM | Admit: 2013-07-15 | Discharge: 2013-07-15 | Disposition: A | Discharge status: Home / Self Care | Payer: Medicare Other | Attending: Emergency Medicine | Admitting: Emergency Medicine

## 2013-07-15 ENCOUNTER — Other Ambulatory Visit: Payer: Self-pay

## 2013-07-15 DIAGNOSIS — J069 Acute upper respiratory infection, unspecified: Secondary | ICD-10-CM

## 2013-07-15 LAB — I-STAT CHEM 8, ED
BUN: 14 mg/dL (ref 6–23)
Calcium, Ion: 1.21 mmol/L (ref 1.12–1.23)
Chloride: 104 mEq/L (ref 96–112)
Creatinine, Ser: 0.8 mg/dL (ref 0.50–1.35)
Glucose, Bld: 167 mg/dL — ABNORMAL HIGH (ref 70–99)
HCT: 38 % — ABNORMAL LOW (ref 39.0–52.0)
Hemoglobin: 12.9 g/dL — ABNORMAL LOW (ref 13.0–17.0)
Potassium: 4 mEq/L (ref 3.7–5.3)
Sodium: 138 mEq/L (ref 137–147)
TCO2: 27 mmol/L (ref 0–100)

## 2013-07-15 LAB — I-STAT TROPONIN, ED: Troponin i, poc: 0 ng/mL (ref 0.00–0.08)

## 2013-07-15 LAB — URINE MICROSCOPIC-ADD ON

## 2013-07-15 LAB — URINALYSIS, ROUTINE W REFLEX MICROSCOPIC
Bilirubin Urine: NEGATIVE
Glucose, UA: NEGATIVE mg/dL
Hgb urine dipstick: NEGATIVE
Ketones, ur: NEGATIVE mg/dL
Leukocytes, UA: NEGATIVE
Nitrite: NEGATIVE
Protein, ur: 30 mg/dL — AB
Specific Gravity, Urine: 1.031 — ABNORMAL HIGH (ref 1.005–1.030)
Urobilinogen, UA: 1 mg/dL (ref 0.0–1.0)
pH: 5.5 (ref 5.0–8.0)

## 2013-07-15 MED ORDER — HYDROCODONE-HOMATROPINE 5-1.5 MG/5ML PO SYRP
5.0000 mL | ORAL_SOLUTION | Freq: Once | ORAL | Status: AC
  Administered 2013-07-15: 5 mL via ORAL
  Filled 2013-07-15: qty 5

## 2013-07-15 MED ORDER — GUAIFENESIN 100 MG/5ML PO LIQD: 100.0000 mg | ORAL | Status: DC | PRN

## 2013-07-15 NOTE — Discharge Instructions (Signed)
Please follow up with your primary care physician in 1-2 days. If you do not have one please call the [facility] number listed above. Please take the Robitussin to help your cough. Please read all discharge instructions and return precautions.    Cough, Adult  A cough is a reflex that helps clear your throat and airways. It can help heal the body or may be a reaction to an irritated airway. A cough may only last 2 or 3 weeks (acute) or may last more than 8 weeks (chronic).  CAUSES Acute cough:  Viral or bacterial infections. Chronic cough:  Infections.  Allergies.  Asthma.  Post-nasal drip.  Smoking.  Heartburn or acid reflux.  Some medicines.  Chronic lung problems (COPD).  Cancer. SYMPTOMS   Cough.  Fever.  Chest pain.  Increased breathing rate.  High-pitched whistling sound when breathing (wheezing).  Colored mucus that you cough up (sputum). TREATMENT   A bacterial cough may be treated with antibiotic medicine.  A viral cough must run its course and will not respond to antibiotics.  Your caregiver may recommend other treatments if you have a chronic cough. HOME CARE INSTRUCTIONS   Only take over-the-counter or prescription medicines for pain, discomfort, or fever as directed by your caregiver. Use cough suppressants only as directed by your caregiver.  Use a cold steam vaporizer or humidifier in your bedroom or home to help loosen secretions.  Sleep in a semi-upright position if your cough is worse at night.  Rest as needed.  Stop smoking if you smoke. SEEK IMMEDIATE MEDICAL CARE IF:   You have pus in your sputum.  Your cough starts to worsen.  You cannot control your cough with suppressants and are losing sleep.  You begin coughing up blood.  You have difficulty breathing.  You develop pain which is getting worse or is uncontrolled with medicine.  You have a fever. MAKE SURE YOU:   Understand these  instructions.  Will watch your condition.  Will get help right away if you are not doing well or get worse. Document Released: 09/21/2010 Document Revised: 06/17/2011 Document Reviewed: 09/21/2010 [facility] Patient Information 2014 [place]. Upper Respiratory Infection, Adult An upper respiratory infection (URI) is also sometimes known as the common cold. The upper respiratory tract includes the nose, sinuses, throat, trachea, and bronchi. Bronchi are the airways leading to the lungs. Most people improve within 1 week, but symptoms can last up to 2 weeks. A residual cough may last even longer.  CAUSES Many different viruses can infect the tissues lining the upper respiratory tract. The tissues become irritated and inflamed and often become very moist. Mucus production is also common. A cold is contagious. You can easily spread the virus to others by oral contact. This includes kissing, sharing a glass, coughing, or sneezing. Touching your mouth or nose and then touching a surface, which is then touched by another person, can also spread the virus. SYMPTOMS  Symptoms typically develop 1 to 3 days after you come in contact with a cold virus. Symptoms vary from person to person. They may include:  Runny nose.  Sneezing.  Nasal congestion.  Sinus irritation.  Sore throat.  Loss of voice (laryngitis).  Cough.  Fatigue.  Muscle aches.  Loss of appetite.  Headache.  Low-grade fever. DIAGNOSIS  You might diagnose your own cold based on familiar symptoms, since most people get a cold 2 to 3 times a year. Your caregiver can confirm this based on  your exam. Most importantly, your caregiver can check that your symptoms are not due to another disease such as strep throat, sinusitis, pneumonia, asthma, or epiglottitis. Blood tests, throat tests, and X-rays are not necessary to diagnose a common cold, but they may sometimes be helpful in excluding other more serious diseases. Your  caregiver will decide if any further tests are required. RISKS AND COMPLICATIONS  You may be at risk for a more severe case of the common cold if you smoke cigarettes, have chronic heart disease (such as heart failure) or lung disease (such as asthma), or if you have a weakened immune system. The very young and very old are also at risk for more serious infections. Bacterial sinusitis, middle ear infections, and bacterial pneumonia can complicate the common cold. The common cold can worsen asthma and chronic obstructive pulmonary disease (COPD). Sometimes, these complications can require emergency medical care and may be life-threatening. PREVENTION  The best way to protect against getting a cold is to practice good hygiene. Avoid oral or hand contact with people with cold symptoms. Wash your hands often if contact occurs. There is no clear evidence that vitamin C, vitamin E, echinacea, or exercise reduces the chance of developing a cold. However, it is always recommended to get plenty of rest and practice good nutrition. TREATMENT  Treatment is directed at relieving symptoms. There is no cure. Antibiotics are not effective, because the infection is caused by a virus, not by bacteria. Treatment may include:  Increased fluid intake. Sports drinks offer valuable electrolytes, sugars, and fluids.  Breathing heated mist or steam (vaporizer or shower).  Eating chicken soup or other clear broths, and maintaining good nutrition.  Getting plenty of rest.  Using gargles or lozenges for comfort.  Controlling fevers with ibuprofen or acetaminophen as directed by your caregiver.  Increasing usage of your inhaler if you have asthma. Zinc gel and zinc lozenges, taken in the first 24 hours of the common cold, can shorten the duration and lessen the severity of symptoms. Pain medicines may help with fever, muscle aches, and throat pain. A variety of non-prescription medicines are available to treat congestion  and runny nose. Your caregiver can make recommendations and may suggest nasal or lung inhalers for other symptoms.  HOME CARE INSTRUCTIONS   Only take over-the-counter or prescription medicines for pain, discomfort, or fever as directed by your caregiver.  Use a warm mist humidifier or inhale steam from a shower to increase air moisture. This may keep secretions moist and make it easier to breathe.  Drink enough water and fluids to keep your urine clear or pale yellow.  Rest as needed.  Return to work when your temperature has returned to normal or as your caregiver advises. You may need to stay home longer to avoid infecting others. You can also use a face mask and careful hand washing to prevent spread of the virus. SEEK MEDICAL CARE IF:   After the first few days, you feel you are getting worse rather than better.  You need your caregiver's advice about medicines to control symptoms.  You develop chills, worsening shortness of breath, or brown or red sputum. These may be signs of pneumonia.  You develop yellow or brown nasal discharge or pain in the face, especially when you bend forward. These may be signs of sinusitis.  You develop a fever, swollen neck glands, pain with swallowing, or white areas in the back of your throat. These may be signs of strep throat. SEEK  IMMEDIATE MEDICAL CARE IF:   You have a fever.  You develop severe or persistent headache, ear pain, sinus pain, or chest pain.  You develop wheezing, a prolonged cough, cough up blood, or have a change in your usual mucus (if you have chronic lung disease).  You develop sore muscles or a stiff neck. Document Released: 09/18/2000 Document Revised: 06/17/2011 Document Reviewed: 07/27/2010 [facility] Patient Information 2014 [place], [place].

## 2013-07-15 NOTE — ED Provider Notes (Signed)
CSN: [id number]     Arrival date & time 07/14/13  2046 History   First MD Initiated Contact with Patient 07/15/13 [phone]     Chief Complaint  Patient presents with  . Cough     (Consider location/radiation/quality/duration/timing/severity/associated sxs/prior Treatment) HPI Comments: Patient is a 48 year old male past medical history significant for AIDS, DM, hypertension presenting to the emergency department for a 3 day history of nonproductive cough with associated posttussive chest discomfort and subjective chills. He has tried his home pain medication with no improvement of his symptoms. He states coughing aggravates his pain. Patient states he has also had intermittent episodes of nonbloody diarrhea over the last few days. He states he is also concerned he may have a possible kidney infection. He denies any fevers, urinary symptoms, flank pain or back pain. Patient states that his blood sugars have been running in the 200s recently.  Patient is a 48 y.o. male presenting with cough.  Cough Associated symptoms: no fever     Past Medical History  Diagnosis Date  . HIV (human immunodeficiency virus infection) 2009    CD4 count 100, VL 13800 (05/01/2010)  . Diabetes type 2, uncontrolled     HgA1c 17.6 (04/27/2010)  . Hypertension   . Genital warts   . Erectile dysfunction   . Chronic knee pain     right  . Osteomyelitis     h/o hand  . Diabetes mellitus   . Chronic pain   . AIDS    Past Surgical History  Procedure Laterality Date  . Multiple extractions with alveoloplasty N/A 01/18/2013    Procedure: MULTIPLE EXTRACION 3, 6, 7, 10, 11, 13, 21, 22, 27, 28, 29, 30 WITH ALVEOLOPLASTY;  Surgeon: Gae Bon, DDS;  Location: [place];  Service: Oral Surgery;  Laterality: N/A;   Family History  Problem Relation Age of Onset  . Hypertension Mother   . Arthritis Father   . Hypertension Father   . Hypertension Brother   . Cancer Maternal Grandmother 29    unknown type of cancer  .  Depression Paternal Grandmother    History  Substance Use Topics  . Smoking status: Never Smoker   . Smokeless tobacco: Never Used  . Alcohol Use: No    Review of Systems  Constitutional: Negative for fever.  Respiratory: Positive for cough and chest tightness.   Gastrointestinal: Positive for diarrhea. Negative for nausea and vomiting.  All other systems reviewed and are negative.     Allergies  Sulfa antibiotics  Home Medications   Current Outpatient Rx  Name  Route  Sig  Dispense  Refill  . aspirin 81 MG EC tablet   Oral   Take 81 mg by mouth every morning.         Marland Kitchen atenolol (TENORMIN) 50 MG tablet   Oral   Take 50 mg by mouth every morning.         . dapsone 100 MG tablet      TAKE 1 TABLET BY MOUTH DAILY   30 tablet   4   . glipiZIDE (GLUCOTROL XL) 5 MG 24 hr tablet   Oral   Take 1 tablet (5 mg total) by mouth every morning.   30 tablet   11   . insulin glargine (LANTUS) 100 UNIT/ML injection   Subcutaneous   Inject 0.5 mLs (50 Units total) into the skin at bedtime.   10 mL   12   . ISENTRESS 400 MG tablet  TAKE 1 TABLET BY MOUTH TWICE DAILY   60 tablet   4   . lisinopril (PRINIVIL,ZESTRIL) 40 MG tablet   Oral   Take 1 tablet (40 mg total) by mouth every morning.   30 tablet   12   . metFORMIN (GLUCOPHAGE) 1000 MG tablet      TAKE 1 TABLET BY MOUTH IN THE MORNING WITH BREAKFAST AND 1 TABLET IN THE EVENING WITH SUPPER   60 tablet   0   . metFORMIN (GLUCOPHAGE) 500 MG tablet   Oral   Take 500 mg by mouth 2 (two) times daily with a meal.         . NORVIR 100 MG TABS tablet      TAKE 1 TABLET BY MOUTH DAILY WITH BREAKFAST   30 tablet   4   . Oxycodone HCl (ROXICODONE) 10 MG TABS   Oral   Take 1 tablet (10 mg total) by mouth every 4 (four) hours as needed for pain.   40 tablet   0   . pravastatin (PRAVACHOL) 40 MG tablet   Oral   Take 1 tablet (40 mg total) by mouth every evening.   30 tablet   11   . PREZISTA 800  MG tablet      TAKE 1 TABLET BY MOUTH DAILY WITH BREAKFAST   30 tablet   4   . testosterone (ANDROGEL) 50 MG/5GM GEL   Transdermal   Place 5 g onto the skin every evening.          . TRUVADA 200-300 MG per tablet      TAKE 1 TABLET BY MOUTH DAILY   30 tablet   4   . guaiFENesin (ROBITUSSIN) 100 MG/5ML liquid   Oral   Take 5-10 mLs (100-200 mg total) by mouth every 4 (four) hours as needed for cough.   60 mL   0   . Insulin Pen Needle 31G X 5 MM MISC   Does not apply   1 application by Does not apply route once. diag code 250.02. Insulin dependent. Used for insulin inj 1 time daily   100 each   6   . Insulin Syringes, Disposable, U-100 1 ML MISC   Does not apply   1 Syringe by Does not apply route 2 (two) times daily.   100 each   0    BP 133/93  Pulse 70  Temp(Src) 97.8 F (36.6 C) (Oral)  Resp 16  Ht 5\' 9"  (1.753 m)  Wt 300 lb (136.079 kg)  BMI 44.28 kg/m2  SpO2 100% Physical Exam  Nursing note and vitals reviewed. Constitutional: He is oriented to person, place, and time. He appears well-developed and well-nourished. No distress.  HENT:  Head: Normocephalic and atraumatic.  Right Ear: External ear normal.  Left Ear: External ear normal.  Nose: Nose normal.  Mouth/Throat: Oropharynx is clear and moist. No oropharyngeal exudate.  Eyes: Conjunctivae are normal.  Neck: Neck supple.  Cardiovascular: Normal rate, regular rhythm, normal heart sounds and intact distal pulses.   Pulmonary/Chest: Effort normal and breath sounds normal. No respiratory distress. He has no wheezes. He has no rales. He exhibits no tenderness.  Abdominal: Soft. Bowel sounds are normal. He exhibits no distension. There is no tenderness. There is no rebound and no guarding.  Musculoskeletal: Normal range of motion. He exhibits no edema.  Neurological: He is alert and oriented to person, place, and time.  Skin: Skin is warm and dry. He is not  diaphoretic.    ED Course  Procedures  (including critical care time) Medications  HYDROcodone-homatropine (HYCODAN) 5-1.5 MG/5ML syrup 5 mL (5 mLs Oral Given 07/15/13 [id number])    Labs Review Labs Reviewed  URINALYSIS, ROUTINE W REFLEX MICROSCOPIC - Abnormal; Notable for the following:    Specific Gravity, Urine 1.031 (*)    Protein, ur 30 (*)    All other components within normal limits  I-STAT CHEM 8, ED - Abnormal; Notable for the following:    Glucose, Bld 167 (*)    Hemoglobin 12.9 (*)    HCT 38.0 (*)    All other components within normal limits  URINE CULTURE  URINE MICROSCOPIC-ADD ON  CBC WITH DIFFERENTIAL  Randolm Idol, ED   Imaging Review Dg Chest 2 View  07/14/2013   CLINICAL DATA:  Cough and right-sided pain  EXAM: CHEST  2 VIEW  COMPARISON:  07/19/2012  FINDINGS: Normal heart size. Aortic tortuosity which is similar to prior. No acute infiltrate or edema. No effusion or pneumothorax. No acute osseous findings.  IMPRESSION: No active cardiopulmonary disease.   Electronically Signed   By: Jorje Guild M.D.   On: 07/14/2013 22:35     EKG Interpretation None      MDM   Final diagnoses:  URI (upper respiratory infection)    Filed Vitals:   07/15/13 0530  BP: 133/93  Pulse: 70  Temp:   Resp:    Afebrile, NAD, non-toxic appearing, AAOx4. Patient presenting with multiple complaints at this visit. I have reviewed nursing notes, vital signs, and all appropriate lab and imaging results for this patient. Glucose is not grossly elevated. No AG. UA is clear for any urine infection, culture sent. Pt CXR negative for acute infiltrate. Chest tightness likely related to cough rather than cardiac etiology. Chest tightness is not likely of cardiac or pulmonary etiology d/t presentation, perc negative, VSS, no tracheal deviation, no JVD or new murmur, RRR, breath sounds equal bilaterally, EKG without acute abnormalities, negative troponin, and negative CXR. Patients symptoms are consistent with URI, likely viral  etiology. Discussed that antibiotics are not indicated for viral infections.  Pt will be discharged with symptomatic treatment.  Verbalizes understanding and is agreeable with plan. Pt is hemodynamically stable & in NAD prior to [place], PA-C 07/15/13 1439

## 2013-07-15 NOTE — ED Notes (Signed)
Had to wake patient up twice to assess pain. Pt is A&Ox4, while awake.

## 2013-07-16 LAB — URINE CULTURE: Colony Count: 2000

## 2013-07-16 NOTE — ED Provider Notes (Signed)
Medical screening examination/treatment/procedure(s) were performed by non-physician practitioner and as supervising physician I was immediately available for consultation/collaboration.   EKG Interpretation None       Varney Biles, MD 07/16/13 [phone]

## 2013-07-21 ENCOUNTER — Other Ambulatory Visit: Payer: Self-pay | Admitting: Internal Medicine

## 2013-07-29 ENCOUNTER — Ambulatory Visit: Payer: Self-pay | Admitting: Podiatry

## 2013-07-31 IMAGING — CR DG KNEE COMPLETE 4+V*R*
4 series · 4 of 4 positions shown · non-contrast
Comparison: 07/28/2012..

CLINICAL DATA: History of injury from fall with painful right knee.

RIGHT KNEE - COMPLETE 4+ VIEW

[t knee ap right]
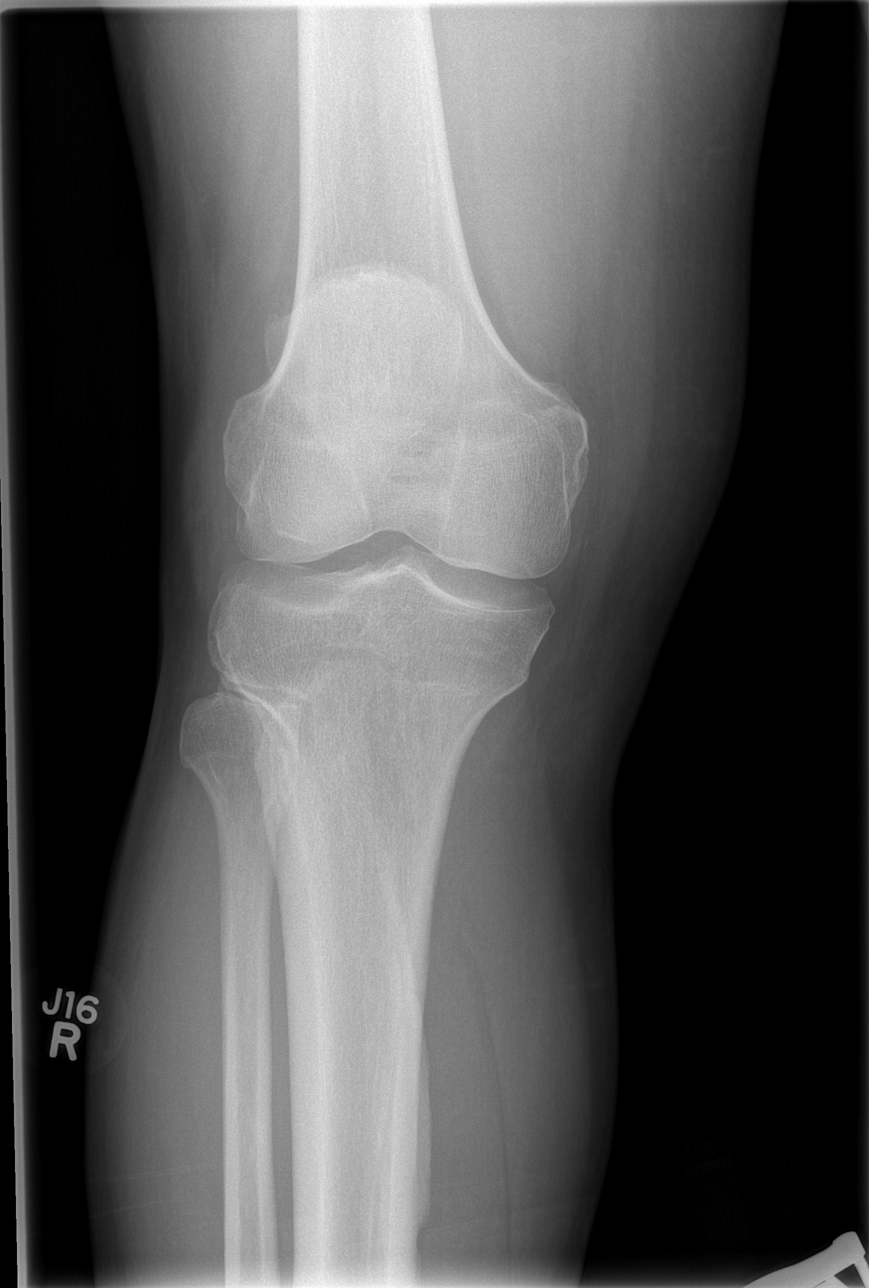

[t knee oblique right (1 of 2)]
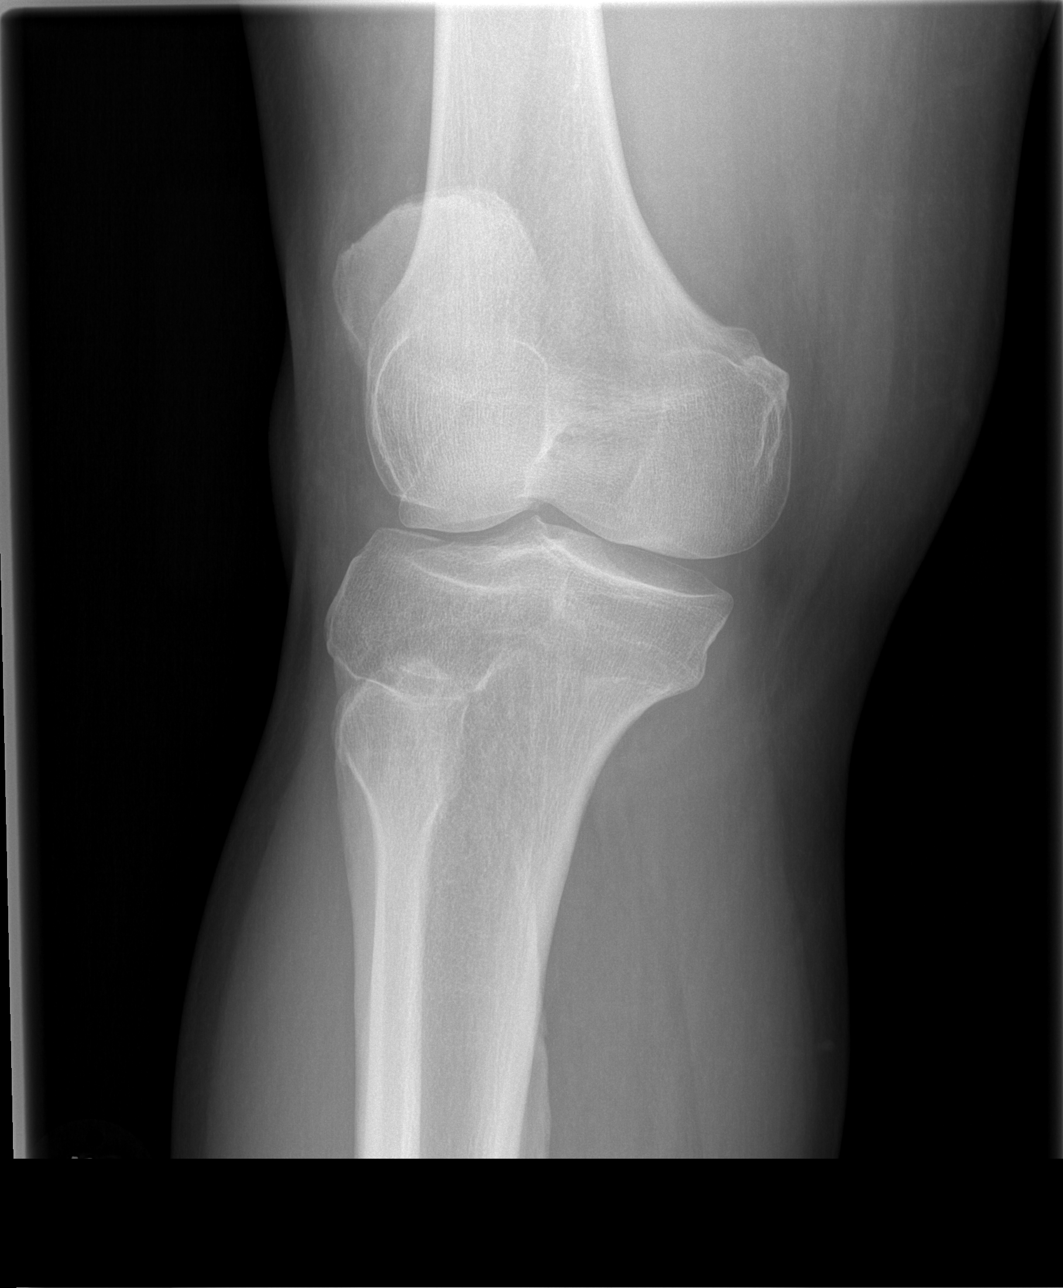

[t knee oblique right (2 of 2)]
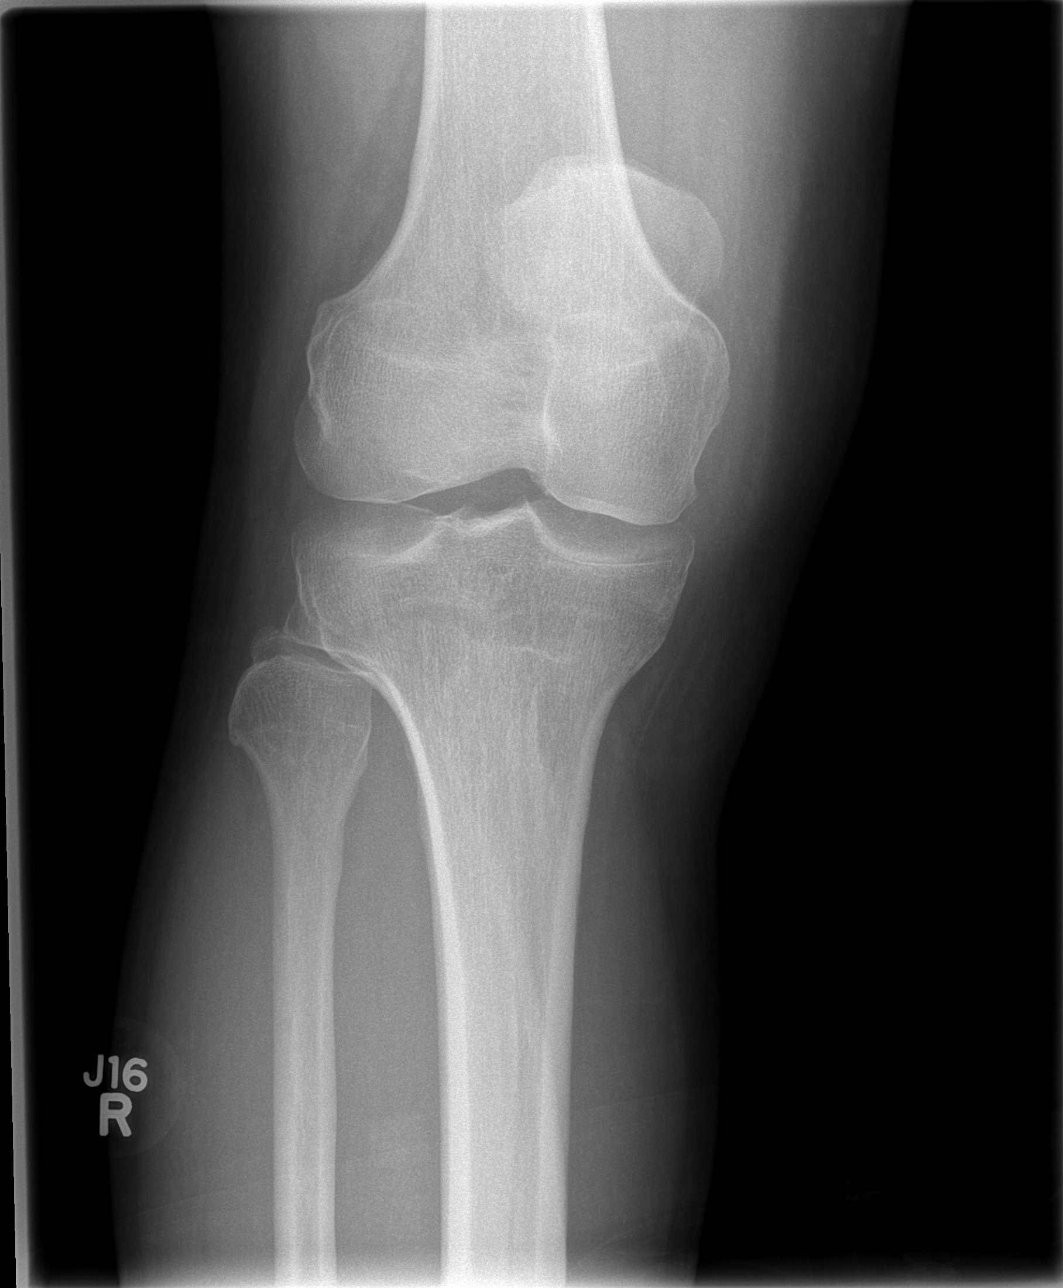

[t knee lat right]
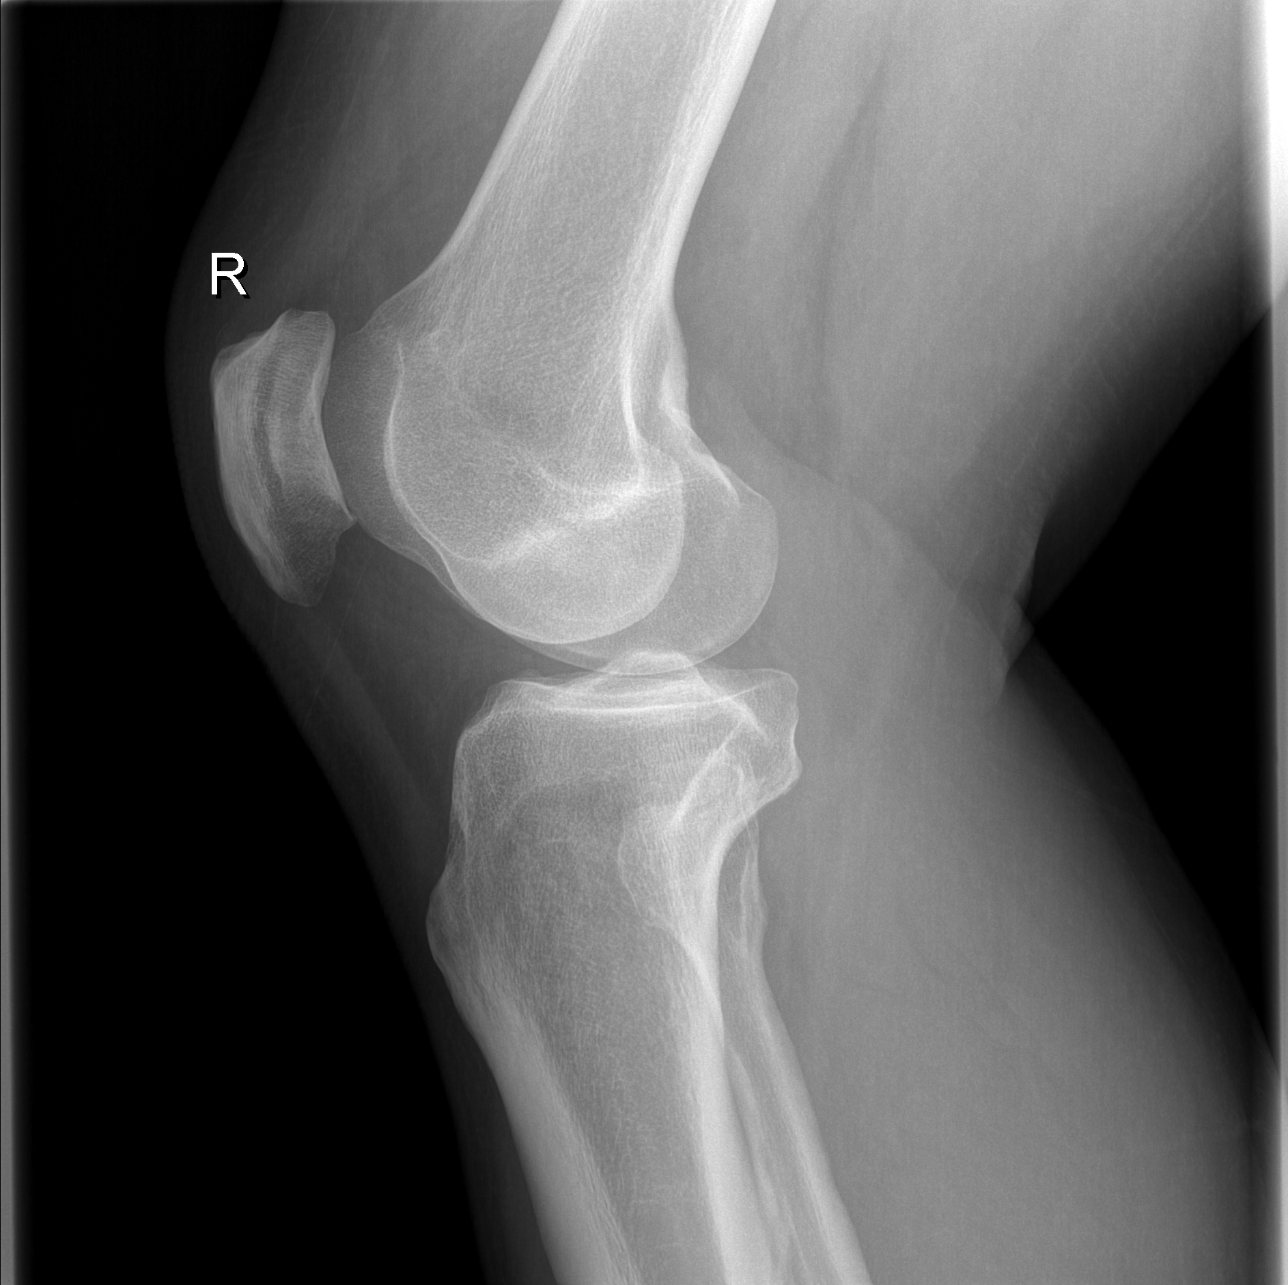

[4 of 4 positions shown; findings below may reference images not displayed]

FINDINGS: No definite joint effusion is seen. Alignment is normal.  Joint
spaces are preserved.  No fracture or dislocation is evident.  No
soft tissue lesions are seen.
IMPRESSION: No fracture or dislocation is evident.

## 2013-08-28 ENCOUNTER — Other Ambulatory Visit: Payer: Self-pay | Admitting: Internal Medicine

## 2013-09-17 ENCOUNTER — Other Ambulatory Visit: Payer: Self-pay | Admitting: Internal Medicine

## 2013-10-06 ENCOUNTER — Other Ambulatory Visit: Payer: Self-pay | Admitting: *Deleted

## 2013-10-06 DIAGNOSIS — E1110 Type 2 diabetes mellitus with ketoacidosis without coma: Secondary | ICD-10-CM

## 2013-10-06 MED ORDER — INSULIN GLARGINE 100 UNIT/ML ~~LOC~~ SOLN: 50.0000 [IU] | Freq: Every day | SUBCUTANEOUS | Status: DC

## 2013-10-06 NOTE — Telephone Encounter (Signed)
Call to [facility] for Prior Authorization for Lantus Solostar Pens # 15 for 30 days.  Approved 7/1/205 thru 04/07/2014.  Call to [facility]  Given Prior Authorization information.  Sander Nephew, RN 10/06/2013 1:48 PM

## 2013-10-15 ENCOUNTER — Encounter: Payer: Medicare Other | Admitting: Internal Medicine

## 2013-11-06 ENCOUNTER — Other Ambulatory Visit: Payer: Self-pay | Admitting: Internal Medicine

## 2013-11-09 ENCOUNTER — Emergency Department (HOSPITAL_COMMUNITY)
Admission: EM | Admit: 2013-11-09 | Discharge: 2013-11-09 | Disposition: A | Discharge status: Home / Self Care | Payer: Medicare Other | Attending: Emergency Medicine | Admitting: Emergency Medicine

## 2013-11-09 ENCOUNTER — Emergency Department (HOSPITAL_COMMUNITY): Payer: Medicare Other

## 2013-11-09 ENCOUNTER — Encounter (HOSPITAL_COMMUNITY): Payer: Self-pay | Admitting: Emergency Medicine

## 2013-11-09 DIAGNOSIS — Z794 Long term (current) use of insulin: Secondary | ICD-10-CM | POA: Diagnosis not present

## 2013-11-09 DIAGNOSIS — Z8739 Personal history of other diseases of the musculoskeletal system and connective tissue: Secondary | ICD-10-CM | POA: Diagnosis not present

## 2013-11-09 DIAGNOSIS — Z79899 Other long term (current) drug therapy: Secondary | ICD-10-CM | POA: Diagnosis not present

## 2013-11-09 DIAGNOSIS — R739 Hyperglycemia, unspecified: Secondary | ICD-10-CM

## 2013-11-09 DIAGNOSIS — Z87448 Personal history of other diseases of urinary system: Secondary | ICD-10-CM | POA: Insufficient documentation

## 2013-11-09 DIAGNOSIS — Z8619 Personal history of other infectious and parasitic diseases: Secondary | ICD-10-CM | POA: Insufficient documentation

## 2013-11-09 DIAGNOSIS — I10 Essential (primary) hypertension: Secondary | ICD-10-CM | POA: Diagnosis not present

## 2013-11-09 DIAGNOSIS — B20 Human immunodeficiency virus [HIV] disease: Secondary | ICD-10-CM | POA: Diagnosis not present

## 2013-11-09 DIAGNOSIS — G8929 Other chronic pain: Secondary | ICD-10-CM | POA: Insufficient documentation

## 2013-11-09 DIAGNOSIS — Z7982 Long term (current) use of aspirin: Secondary | ICD-10-CM | POA: Diagnosis not present

## 2013-11-09 DIAGNOSIS — IMO0001 Reserved for inherently not codable concepts without codable children: Secondary | ICD-10-CM | POA: Insufficient documentation

## 2013-11-09 DIAGNOSIS — E1165 Type 2 diabetes mellitus with hyperglycemia: Principal | ICD-10-CM

## 2013-11-09 LAB — I-STAT CHEM 8, ED
BUN: 14 mg/dL (ref 6–23)
Calcium, Ion: 1.24 mmol/L — ABNORMAL HIGH (ref 1.12–1.23)
Chloride: 93 mEq/L — ABNORMAL LOW (ref 96–112)
Creatinine, Ser: 1 mg/dL (ref 0.50–1.35)
Glucose, Bld: >700 mg/dL (ref 70–99)
HCT: 46 % (ref 39.0–52.0)
Hemoglobin: 15.6 g/dL (ref 13.0–17.0)
Potassium: 4.8 mEq/L (ref 3.7–5.3)
Sodium: 128 mEq/L — ABNORMAL LOW (ref 137–147)
TCO2: 23 mmol/L (ref 0–100)

## 2013-11-09 LAB — CBC WITH DIFFERENTIAL/PLATELET
Basophils Absolute: 0 10*3/uL (ref 0.0–0.1)
Basophils Relative: 0 % (ref 0–1)
Eosinophils Absolute: 0 10*3/uL (ref 0.0–0.7)
Eosinophils Relative: 1 % (ref 0–5)
HCT: 40.7 % (ref 39.0–52.0)
Hemoglobin: 13.4 g/dL (ref 13.0–17.0)
Lymphocytes Relative: 23 % (ref 12–46)
Lymphs Abs: 1.2 10*3/uL (ref 0.7–4.0)
MCH: 27.6 pg (ref 26.0–34.0)
MCHC: 32.9 g/dL (ref 30.0–36.0)
MCV: 83.9 fL (ref 78.0–100.0)
Monocytes Absolute: 0.3 10*3/uL (ref 0.1–1.0)
Monocytes Relative: 6 % (ref 3–12)
Neutro Abs: 3.9 10*3/uL (ref 1.7–7.7)
Neutrophils Relative %: 70 % (ref 43–77)
Platelets: 232 10*3/uL (ref 150–400)
RBC: 4.85 MIL/uL (ref 4.22–5.81)
RDW: 13 % (ref 11.5–15.5)
WBC: 5.5 10*3/uL (ref 4.0–10.5)

## 2013-11-09 LAB — I-STAT VENOUS BLOOD GAS, ED
Acid-Base Excess: 1 mmol/L (ref 0.0–2.0)
Bicarbonate: 26.9 mEq/L — ABNORMAL HIGH (ref 20.0–24.0)
O2 Saturation: 73 %
TCO2: 28 mmol/L (ref 0–100)
pCO2, Ven: 45.9 mmHg (ref 45.0–50.0)
pH, Ven: 7.375 — ABNORMAL HIGH (ref 7.250–7.300)
pO2, Ven: 40 mmHg (ref 30.0–45.0)

## 2013-11-09 LAB — COMPREHENSIVE METABOLIC PANEL
ALT: 18 U/L (ref 0–53)
AST: 17 U/L (ref 0–37)
Albumin: 4 g/dL (ref 3.5–5.2)
Alkaline Phosphatase: 121 U/L — ABNORMAL HIGH (ref 39–117)
Anion gap: 16 — ABNORMAL HIGH (ref 5–15)
BUN: 13 mg/dL (ref 6–23)
CO2: 23 mEq/L (ref 19–32)
Calcium: 9.8 mg/dL (ref 8.4–10.5)
Chloride: 88 mEq/L — ABNORMAL LOW (ref 96–112)
Creatinine, Ser: 0.96 mg/dL (ref 0.50–1.35)
GFR calc Af Amer: >90 mL/min (ref >90)
GFR calc non Af Amer: >90 mL/min (ref >90)
Glucose, Bld: 684 mg/dL (ref 70–99)
Potassium: 4.9 mEq/L (ref 3.7–5.3)
Sodium: 127 mEq/L — ABNORMAL LOW (ref 137–147)
Total Bilirubin: 0.2 mg/dL — ABNORMAL LOW (ref 0.3–1.2)
Total Protein: 8.3 g/dL (ref 6.0–8.3)

## 2013-11-09 LAB — URINALYSIS, ROUTINE W REFLEX MICROSCOPIC
Bilirubin Urine: NEGATIVE
Glucose, UA: >1000 mg/dL — AB
Hgb urine dipstick: NEGATIVE
Ketones, ur: NEGATIVE mg/dL
Leukocytes, UA: NEGATIVE
Nitrite: NEGATIVE
Protein, ur: NEGATIVE mg/dL
Specific Gravity, Urine: 1.035 — ABNORMAL HIGH (ref 1.005–1.030)
Urobilinogen, UA: 0.2 mg/dL (ref 0.0–1.0)
pH: 5 (ref 5.0–8.0)

## 2013-11-09 LAB — URINE MICROSCOPIC-ADD ON

## 2013-11-09 LAB — CBG MONITORING, ED
Glucose-Capillary: >600 mg/dL (ref 70–99)
Glucose-Capillary: 592 mg/dL (ref 70–99)
Glucose-Capillary: 439 mg/dL — ABNORMAL HIGH (ref 70–99)
Glucose-Capillary: 403 mg/dL — ABNORMAL HIGH (ref 70–99)
Glucose-Capillary: 299 mg/dL — ABNORMAL HIGH (ref 70–99)

## 2013-11-09 LAB — TROPONIN I: Troponin I: <0.3 ng/mL (ref <0.30)

## 2013-11-09 MED ORDER — SODIUM CHLORIDE 0.9 % IV BOLUS (SEPSIS)
1000.0000 mL | Freq: Once | INTRAVENOUS | Status: AC
  Administered 2013-11-09: 1000 mL via INTRAVENOUS

## 2013-11-09 MED ORDER — SODIUM CHLORIDE 0.9 % IV SOLN
INTRAVENOUS | Status: DC
  Administered 2013-11-09: 5.3 [IU]/h via INTRAVENOUS
  Filled 2013-11-09: qty 1

## 2013-11-09 MED ORDER — INSULIN ASPART 100 UNIT/ML ~~LOC~~ SOLN
10.0000 [IU] | Freq: Once | SUBCUTANEOUS | Status: AC
  Administered 2013-11-09: 10 [IU] via INTRAVENOUS
  Filled 2013-11-09: qty 1

## 2013-11-09 MED ORDER — SODIUM CHLORIDE 0.9 % IV SOLN
1000.0000 mL | Freq: Once | INTRAVENOUS | Status: AC
  Administered 2013-11-09: 1000 mL via INTRAVENOUS

## 2013-11-09 NOTE — ED Provider Notes (Signed)
CSN: [id number]     Arrival date & time 11/09/13  1322 History   First MD Initiated Contact with Patient 11/09/13 1505     Chief Complaint  Patient presents with  . Hyperglycemia     (Consider location/radiation/quality/duration/timing/severity/associated sxs/prior Treatment) Patient is a 48 y.o. male presenting with hyperglycemia.  Hyperglycemia Severity:  Severe Onset quality:  Gradual Duration:  2 weeks Timing:  Constant Progression:  Unchanged Chronicity:  New Diabetes status:  Controlled with insulin Time since last antidiabetic medication:  1 day Context: not change in medication, not new diabetes diagnosis, not noncompliance and not recent illness   Relieved by:  Nothing Ineffective treatments:  None tried Associated symptoms: increased appetite, increased thirst, malaise and polyuria   Associated symptoms: no abdominal pain, no altered mental status, no chest pain, no confusion, no dizziness, no fever, no nausea, no shortness of breath and no vomiting   Risk factors: no hx of DKA     Past Medical History  Diagnosis Date  . HIV (human immunodeficiency virus infection) 2009    CD4 count 100, VL 13800 (05/01/2010)  . Diabetes type 2, uncontrolled     HgA1c 17.6 (04/27/2010)  . Hypertension   . Genital warts   . Erectile dysfunction   . Chronic knee pain     right  . Osteomyelitis     h/o hand  . Diabetes mellitus   . Chronic pain   . AIDS    Past Surgical History  Procedure Laterality Date  . Multiple extractions with alveoloplasty N/A 01/18/2013    Procedure: MULTIPLE EXTRACION 3, 6, 7, 10, 11, 13, 21, 22, 27, 28, 29, 30 WITH ALVEOLOPLASTY;  Surgeon: Gae Bon, DDS;  Location: [place];  Service: Oral Surgery;  Laterality: N/A;   Family History  Problem Relation Age of Onset  . Hypertension Mother   . Arthritis Father   . Hypertension Father   . Hypertension Brother   . Cancer Maternal Grandmother 24    unknown type of cancer  . Depression Paternal  Grandmother    History  Substance Use Topics  . Smoking status: Never Smoker   . Smokeless tobacco: Never Used  . Alcohol Use: No    Review of Systems  Constitutional: Negative for fever and chills.  HENT: Negative for congestion and facial swelling.   Eyes: Negative for discharge and visual disturbance.  Respiratory: Negative for shortness of breath.   Cardiovascular: Negative for chest pain and palpitations.  Gastrointestinal: Negative for nausea, vomiting, abdominal pain and diarrhea.  Endocrine: Positive for polydipsia, polyphagia and polyuria.  Musculoskeletal: Negative for arthralgias and myalgias.  Skin: Negative for color change and rash.  Neurological: Negative for dizziness, tremors, syncope and headaches.  Psychiatric/Behavioral: Negative for confusion and dysphoric mood.      Allergies  Sulfa antibiotics  Home Medications   Prior to Admission medications   Medication Sig Start Date End Date Taking? Authorizing Provider  aspirin 81 MG EC tablet Take 81 mg by mouth daily.  08/20/11  Yes [name], MD  dapsone 100 MG tablet Take 100 mg by mouth daily.   Yes Historical Provider, MD  Darunavir Ethanolate (PREZISTA) 800 MG tablet Take 800 mg by mouth at bedtime.    Yes Historical Provider, MD  emtricitabine-tenofovir (TRUVADA) 200-300 MG per tablet Take 1 tablet by mouth at bedtime.    Yes Historical Provider, MD  glipiZIDE (GLUCOTROL XL) 5 MG 24 hr tablet Take 5 mg by mouth at bedtime.  Yes Historical Provider, MD  Insulin Glargine (LANTUS SOLOSTAR) 100 UNIT/ML Solostar Pen Inject 50 Units into the skin at bedtime.   Yes Historical Provider, MD  lisinopril (PRINIVIL,ZESTRIL) 40 MG tablet Take 40 mg by mouth daily.   Yes Historical Provider, MD  metFORMIN (GLUCOPHAGE) 1000 MG tablet Take 1,000 mg by mouth daily with breakfast.    Yes Historical Provider, MD  Oxycodone HCl 10 MG TABS Take 10 mg by mouth 3 (three) times daily as needed (pain).   Yes Historical  Provider, MD  pravastatin (PRAVACHOL) 40 MG tablet Take 40 mg by mouth daily.   Yes Historical Provider, MD  raltegravir (ISENTRESS) 400 MG tablet Take 400 mg by mouth 2 (two) times daily.   Yes Historical Provider, MD  ritonavir (NORVIR) 100 MG TABS tablet Take 100 mg by mouth at bedtime.    Yes Historical Provider, MD  testosterone (ANDROGEL) 50 MG/5GM GEL Place 5 g onto the skin daily.    Yes Historical Provider, MD  Insulin Pen Needle 31G X 5 MM MISC 1 application by Does not apply route once. diag code 250.02. Insulin dependent. Used for insulin inj 1 time daily 11/23/12   Clinton Gallant, MD  Insulin Syringes, Disposable, U-100 1 ML MISC 1 Syringe by Does not apply route 2 (two) times daily. 09/21/12   [name], MD   BP 122/78  Pulse 62  Temp(Src) 97.8 F (36.6 C) (Oral)  Resp 14  Wt 283 lb 12.8 oz (128.731 kg)  SpO2 98% Physical Exam  Constitutional: He is oriented to person, place, and time. He appears well-developed and well-nourished.  HENT:  Head: Normocephalic and atraumatic.  Eyes: EOM are normal. Pupils are equal, round, and reactive to light.  Neck: Normal range of motion. Neck supple. No JVD present.  Cardiovascular: Normal rate and regular rhythm.  Exam reveals no gallop and no friction rub.   No murmur heard. Pulmonary/Chest: No respiratory distress. He has no wheezes.  Abdominal: He exhibits no distension. There is no rebound and no guarding.  Musculoskeletal: Normal range of motion.  Neurological: He is alert and oriented to person, place, and time.  Skin: No rash noted. No pallor.  Psychiatric: He has a normal mood and affect. His behavior is normal.    ED Course  Procedures (including critical care time) Labs Review Labs Reviewed  COMPREHENSIVE METABOLIC PANEL - Abnormal; Notable for the following:    Sodium 127 (*)    Chloride 88 (*)    Glucose, Bld 684 (*)    Alkaline Phosphatase 121 (*)    Total Bilirubin 0.2 (*)    Anion gap 16 (*)    All other  components within normal limits  URINALYSIS, ROUTINE W REFLEX MICROSCOPIC - Abnormal; Notable for the following:    Specific Gravity, Urine 1.035 (*)    Glucose, UA >1000 (*)    All other components within normal limits  CBG MONITORING, ED - Abnormal; Notable for the following:    Glucose-Capillary >600 (*)    All other components within normal limits  I-STAT VENOUS BLOOD GAS, ED - Abnormal; Notable for the following:    pH, Ven 7.375 (*)    Bicarbonate 26.9 (*)    All other components within normal limits  I-STAT CHEM 8, ED - Abnormal; Notable for the following:    Sodium 128 (*)    Chloride 93 (*)    Glucose, Bld >700 (*)    Calcium, Ion 1.24 (*)    All other  components within normal limits  CBG MONITORING, ED - Abnormal; Notable for the following:    Glucose-Capillary 592 (*)    All other components within normal limits  CBG MONITORING, ED - Abnormal; Notable for the following:    Glucose-Capillary 439 (*)    All other components within normal limits  CBG MONITORING, ED - Abnormal; Notable for the following:    Glucose-Capillary 403 (*)    All other components within normal limits  CBG MONITORING, ED - Abnormal; Notable for the following:    Glucose-Capillary 299 (*)    All other components within normal limits  CBC WITH DIFFERENTIAL  URINE MICROSCOPIC-ADD ON  TROPONIN I    Imaging Review Dg Chest Portable 1 View  11/09/2013   CLINICAL DATA:  Diabetes.  EXAM: PORTABLE CHEST - 1 VIEW  COMPARISON:  07/14/2013.  FINDINGS: Mediastinum hilar structures are normal. Lungs are clear. Heart size is stable. Normal pulmonary vascularity. No pleural effusion or pneumothorax. No acute bony abnormality.  IMPRESSION: No acute cardiopulmonary disease.   Electronically Signed   By: Marcello Moores  Register   On: 11/09/2013 16:05     EKG Interpretation None      MDM   Final diagnoses:  Hyperglycemia    Patient is a 48 y.o. male who presents with hyperglycemia.  General malaise with this.   Hx of HIV last CD4 count over a year ago 100.  Patient states that he has been taking his medications as prescribed, no missed doses of insulin.  Denies fever, chills, abdominal pain, vomiting, diarrhea.  No cough, runny nose.    Istat chem 8 with K 4.8, blood sugar >700.  Started on glucocommander.  CXR, trop, cmp pending. Chest x-ray and troponin unremarkable. Patient with  no focal signs of infection on exam.  Patient's blood sugar improved while on Glucomander. Patient's blood sugar below 300. He will be discharged home he will call his PCP tomorrow for possible changes to his insulin regimen.  9:00 PM:  I have discussed the diagnosis/risks/treatment options with the patient and believe the pt to be eligible for discharge home to follow-up with PCP. We also discussed returning to the ED immediately if new or worsening sx occur. We discussed the sx which are most concerning (e.g., worsening blood glucose, vomiting, abdominal pain, fevers, chills. ) that necessitate immediate return. Medications administered to the patient during their visit and any new prescriptions provided to the patient are listed below.  Medications given during this visit Medications  0.9 %  sodium chloride infusion (0 mLs Intravenous Stopped 11/09/13 1631)  sodium chloride 0.9 % bolus 1,000 mL (0 mLs Intravenous Stopped 11/09/13 2034)  insulin aspart (novoLOG) injection 10 Units (10 Units Intravenous Given 11/09/13 2034)    New Prescriptions   No medications on file     Deno Etienne, MD 11/09/13 2100

## 2013-11-09 NOTE — Discharge Instructions (Signed)

## 2013-11-09 NOTE — ED Notes (Signed)
Pt presents to department for evaluation of hyperglycemia. States increased thirst and frequent urination over the past several weeks. Denies pain. Pt is alert and oriented x4.

## 2013-11-09 NOTE — ED Notes (Signed)
CBG Exceeds Measure. Notified Nurse Jinny Blossom.

## 2013-11-09 NOTE — ED Notes (Signed)
Pt requesting something to eat at this time.  St's he wants to go home.

## 2013-11-09 NOTE — ED Notes (Signed)
Patient asked for and received three cups of water.

## 2013-11-09 NOTE — ED Notes (Signed)
VBG and CHem-8 results reported to Dr. Kathrynn Humble

## 2013-11-10 NOTE — ED Provider Notes (Signed)
I performed a history and physical examination of  MASAYUKI BURGHARDT and discussed his management with Dr. Tyrone Nine. I agree with the history, physical, assessment, and plan of care, with the following exceptions: None I was present for the following procedures: None  Time Spent in Critical Care of the patient: 40 minutes  Time spent in discussions with the patient and family: 10 minutes  Jhoan Schmieder   CRITICAL CARE Performed by: Varney Biles   Total critical care time: 40 min  Critical care time was exclusive of separately billable procedures and treating other patients.  Critical care was necessary to treat or prevent imminent or life-threatening deterioration.  Critical care was time spent personally by me on the following activities: development of treatment plan with patient and/or surrogate as well as nursing, discussions with consultants, evaluation of patient's response to treatment, examination of patient, obtaining history from patient or surrogate, ordering and performing treatments and interventions, ordering and review of laboratory studies, ordering and review of radiographic studies, pulse oximetry and re-evaluation of patient's condition.   Pt comes in with cc of elevated blood sugar. He has hx of AIDS. His blood sugar is around 700. No anion gap. He has no source of infection per hx and exam. Pt taking his meds. Blood sugars slowly brought down. Pt will see his endocrinologist this week. Return precautions have been discussed.  Varney Biles, MD 11/10/13 [phone]

## 2013-11-22 ENCOUNTER — Other Ambulatory Visit: Payer: Medicaid Other

## 2013-12-06 ENCOUNTER — Ambulatory Visit: Payer: Medicaid Other | Admitting: Infectious Diseases

## 2013-12-06 ENCOUNTER — Telehealth: Payer: Self-pay | Admitting: *Deleted

## 2013-12-06 NOTE — Telephone Encounter (Signed)
Left generic message notifying patient of his missed visit. Asked him to call back for rescheduling options. Landis Gandy, RN

## 2013-12-31 ENCOUNTER — Other Ambulatory Visit: Payer: Self-pay | Admitting: Internal Medicine

## 2013-12-31 DIAGNOSIS — E785 Hyperlipidemia, unspecified: Secondary | ICD-10-CM

## 2013-12-31 DIAGNOSIS — E1159 Type 2 diabetes mellitus with other circulatory complications: Secondary | ICD-10-CM

## 2013-12-31 DIAGNOSIS — N521 Erectile dysfunction due to diseases classified elsewhere: Principal | ICD-10-CM

## 2014-01-22 ENCOUNTER — Encounter (HOSPITAL_COMMUNITY): Payer: Self-pay | Admitting: Emergency Medicine

## 2014-01-22 DIAGNOSIS — Z79899 Other long term (current) drug therapy: Secondary | ICD-10-CM | POA: Diagnosis not present

## 2014-01-22 DIAGNOSIS — Z794 Long term (current) use of insulin: Secondary | ICD-10-CM | POA: Insufficient documentation

## 2014-01-22 DIAGNOSIS — E1165 Type 2 diabetes mellitus with hyperglycemia: Secondary | ICD-10-CM | POA: Diagnosis not present

## 2014-01-22 DIAGNOSIS — I10 Essential (primary) hypertension: Secondary | ICD-10-CM | POA: Diagnosis not present

## 2014-01-22 DIAGNOSIS — R05 Cough: Secondary | ICD-10-CM | POA: Insufficient documentation

## 2014-01-22 DIAGNOSIS — G8929 Other chronic pain: Secondary | ICD-10-CM | POA: Insufficient documentation

## 2014-01-22 DIAGNOSIS — M25561 Pain in right knee: Secondary | ICD-10-CM | POA: Insufficient documentation

## 2014-01-22 DIAGNOSIS — Z7982 Long term (current) use of aspirin: Secondary | ICD-10-CM | POA: Insufficient documentation

## 2014-01-22 DIAGNOSIS — Z87448 Personal history of other diseases of urinary system: Secondary | ICD-10-CM | POA: Diagnosis not present

## 2014-01-22 DIAGNOSIS — B20 Human immunodeficiency virus [HIV] disease: Secondary | ICD-10-CM | POA: Insufficient documentation

## 2014-01-22 LAB — CBG MONITORING, ED: Glucose-Capillary: 471 mg/dL — ABNORMAL HIGH (ref 70–99)

## 2014-01-22 NOTE — ED Notes (Signed)
C/o high blood sugar. Reports cough and cold sx (yellow sputum) also R knee. Alert, NAD, calm, interactive, steady gait.

## 2014-01-22 NOTE — ED Notes (Signed)
Checked CBG

## 2014-01-23 ENCOUNTER — Emergency Department (HOSPITAL_COMMUNITY): Payer: Medicare Other

## 2014-01-23 ENCOUNTER — Emergency Department (HOSPITAL_COMMUNITY)
Admission: EM | Admit: 2014-01-23 | Discharge: 2014-01-23 | Disposition: A | Discharge status: Home / Self Care | Payer: Medicare Other | Attending: Emergency Medicine | Admitting: Emergency Medicine

## 2014-01-23 DIAGNOSIS — R05 Cough: Secondary | ICD-10-CM

## 2014-01-23 DIAGNOSIS — M25561 Pain in right knee: Secondary | ICD-10-CM

## 2014-01-23 DIAGNOSIS — G8929 Other chronic pain: Secondary | ICD-10-CM

## 2014-01-23 DIAGNOSIS — R059 Cough, unspecified: Secondary | ICD-10-CM

## 2014-01-23 DIAGNOSIS — R739 Hyperglycemia, unspecified: Secondary | ICD-10-CM

## 2014-01-23 LAB — I-STAT VENOUS BLOOD GAS, ED
Acid-Base Excess: 2 mmol/L (ref 0.0–2.0)
Bicarbonate: 27.8 mEq/L — ABNORMAL HIGH (ref 20.0–24.0)
O2 Saturation: 83 %
Patient temperature: 37
TCO2: 29 mmol/L (ref 0–100)
pCO2, Ven: 46 mmHg (ref 45.0–50.0)
pH, Ven: 7.388 — ABNORMAL HIGH (ref 7.250–7.300)
pO2, Ven: 48 mmHg — ABNORMAL HIGH (ref 30.0–45.0)

## 2014-01-23 LAB — COMPREHENSIVE METABOLIC PANEL
ALT: 13 U/L (ref 0–53)
AST: 14 U/L (ref 0–37)
Albumin: 3.6 g/dL (ref 3.5–5.2)
Alkaline Phosphatase: 88 U/L (ref 39–117)
Anion gap: 13 (ref 5–15)
BUN: 10 mg/dL (ref 6–23)
CO2: 25 mEq/L (ref 19–32)
Calcium: 9.4 mg/dL (ref 8.4–10.5)
Chloride: 92 mEq/L — ABNORMAL LOW (ref 96–112)
Creatinine, Ser: 0.79 mg/dL (ref 0.50–1.35)
GFR calc Af Amer: >90 mL/min (ref >90)
GFR calc non Af Amer: >90 mL/min (ref >90)
Glucose, Bld: 471 mg/dL — ABNORMAL HIGH (ref 70–99)
Potassium: 4 mEq/L (ref 3.7–5.3)
Sodium: 130 mEq/L — ABNORMAL LOW (ref 137–147)
Total Bilirubin: 0.3 mg/dL (ref 0.3–1.2)
Total Protein: 7.8 g/dL (ref 6.0–8.3)

## 2014-01-23 LAB — CBC WITH DIFFERENTIAL/PLATELET
Basophils Absolute: 0 10*3/uL (ref 0.0–0.1)
Basophils Relative: 0 % (ref 0–1)
Eosinophils Absolute: 0.1 10*3/uL (ref 0.0–0.7)
Eosinophils Relative: 1 % (ref 0–5)
HCT: 36.9 % — ABNORMAL LOW (ref 39.0–52.0)
Hemoglobin: 12.2 g/dL — ABNORMAL LOW (ref 13.0–17.0)
Lymphocytes Relative: 33 % (ref 12–46)
Lymphs Abs: 2.1 10*3/uL (ref 0.7–4.0)
MCH: 26.7 pg (ref 26.0–34.0)
MCHC: 33.1 g/dL (ref 30.0–36.0)
MCV: 80.7 fL (ref 78.0–100.0)
Monocytes Absolute: 0.4 10*3/uL (ref 0.1–1.0)
Monocytes Relative: 6 % (ref 3–12)
Neutro Abs: 3.9 10*3/uL (ref 1.7–7.7)
Neutrophils Relative %: 60 % (ref 43–77)
Platelets: 208 10*3/uL (ref 150–400)
RBC: 4.57 MIL/uL (ref 4.22–5.81)
RDW: 12.8 % (ref 11.5–15.5)
WBC: 6.5 10*3/uL (ref 4.0–10.5)

## 2014-01-23 LAB — URINALYSIS, ROUTINE W REFLEX MICROSCOPIC
Bilirubin Urine: NEGATIVE
Glucose, UA: >1000 mg/dL — AB
Hgb urine dipstick: NEGATIVE
Ketones, ur: NEGATIVE mg/dL
Leukocytes, UA: NEGATIVE
Nitrite: NEGATIVE
Protein, ur: NEGATIVE mg/dL
Specific Gravity, Urine: 1.037 — ABNORMAL HIGH (ref 1.005–1.030)
Urobilinogen, UA: 0.2 mg/dL (ref 0.0–1.0)
pH: 5.5 (ref 5.0–8.0)

## 2014-01-23 LAB — I-STAT CG4 LACTIC ACID, ED: Lactic Acid, Venous: 0.99 mmol/L (ref 0.5–2.2)

## 2014-01-23 LAB — URINE MICROSCOPIC-ADD ON

## 2014-01-23 LAB — CBG MONITORING, ED
Glucose-Capillary: 445 mg/dL — ABNORMAL HIGH (ref 70–99)
Glucose-Capillary: 327 mg/dL — ABNORMAL HIGH (ref 70–99)

## 2014-01-23 MED ORDER — INSULIN ASPART 100 UNIT/ML ~~LOC~~ SOLN
5.0000 [IU] | Freq: Once | SUBCUTANEOUS | Status: AC
  Administered 2014-01-23: 5 [IU] via INTRAVENOUS
  Filled 2014-01-23: qty 1

## 2014-01-23 MED ORDER — OXYCODONE HCL 5 MG PO TABS
10.0000 mg | ORAL_TABLET | Freq: Once | ORAL | Status: AC
  Administered 2014-01-23: 10 mg via ORAL
  Filled 2014-01-23: qty 2

## 2014-01-23 MED ORDER — SODIUM CHLORIDE 0.9 % IV BOLUS (SEPSIS)
1000.0000 mL | Freq: Once | INTRAVENOUS | Status: AC
  Administered 2014-01-23: 1000 mL via INTRAVENOUS

## 2014-01-23 MED ORDER — OXYCODONE HCL 5 MG PO TABS: ORAL_TABLET | ORAL | Status: DC

## 2014-01-23 NOTE — ED Notes (Signed)
Patient transported to X-ray 

## 2014-01-23 NOTE — ED Notes (Signed)
CBG 327 at this time
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
## 2014-01-23 NOTE — Discharge Instructions (Signed)
Please follow with your primary care doctor in the next 2 days for a check-up. They must obtain records for further management.   Do not hesitate to return to the Emergency Department for any new, worsening or concerning symptoms.   Take oxycodone for breakthrough pain, do not drink alcohol, drive, care for children or do other critical tasks while taking oxycodone.

## 2014-01-23 NOTE — ED Provider Notes (Signed)
Medical screening examination/treatment/procedure(s) were performed by non-physician practitioner and as supervising physician I was immediately available for consultation/collaboration.    Delora Fuel, MD [phone]

## 2014-02-04 ENCOUNTER — Inpatient Hospital Stay (HOSPITAL_COMMUNITY)
Admission: EM | Admit: 2014-02-04 | Discharge: 2014-02-05 | DRG: 637 | Disposition: A | Discharge status: Home / Self Care | Payer: Medicare Other | Attending: Internal Medicine | Admitting: Internal Medicine

## 2014-02-04 ENCOUNTER — Encounter (HOSPITAL_COMMUNITY): Payer: Self-pay | Admitting: Emergency Medicine

## 2014-02-04 ENCOUNTER — Ambulatory Visit: Payer: Medicaid Other | Admitting: Infectious Diseases

## 2014-02-04 DIAGNOSIS — M25561 Pain in right knee: Secondary | ICD-10-CM

## 2014-02-04 DIAGNOSIS — G8929 Other chronic pain: Secondary | ICD-10-CM | POA: Diagnosis present

## 2014-02-04 DIAGNOSIS — E785 Hyperlipidemia, unspecified: Secondary | ICD-10-CM | POA: Diagnosis present

## 2014-02-04 DIAGNOSIS — Z882 Allergy status to sulfonamides status: Secondary | ICD-10-CM | POA: Diagnosis not present

## 2014-02-04 DIAGNOSIS — Z23 Encounter for immunization: Secondary | ICD-10-CM

## 2014-02-04 DIAGNOSIS — Z794 Long term (current) use of insulin: Secondary | ICD-10-CM | POA: Diagnosis not present

## 2014-02-04 DIAGNOSIS — B20 Human immunodeficiency virus [HIV] disease: Secondary | ICD-10-CM | POA: Diagnosis present

## 2014-02-04 DIAGNOSIS — Z79899 Other long term (current) drug therapy: Secondary | ICD-10-CM

## 2014-02-04 DIAGNOSIS — I10 Essential (primary) hypertension: Secondary | ICD-10-CM | POA: Diagnosis present

## 2014-02-04 DIAGNOSIS — R739 Hyperglycemia, unspecified: Secondary | ICD-10-CM | POA: Diagnosis present

## 2014-02-04 DIAGNOSIS — E1165 Type 2 diabetes mellitus with hyperglycemia: Principal | ICD-10-CM | POA: Diagnosis present

## 2014-02-04 DIAGNOSIS — N521 Erectile dysfunction due to diseases classified elsewhere: Secondary | ICD-10-CM

## 2014-02-04 DIAGNOSIS — E119 Type 2 diabetes mellitus without complications: Secondary | ICD-10-CM | POA: Diagnosis present

## 2014-02-04 DIAGNOSIS — Z21 Asymptomatic human immunodeficiency virus [HIV] infection status: Secondary | ICD-10-CM

## 2014-02-04 DIAGNOSIS — E1159 Type 2 diabetes mellitus with other circulatory complications: Secondary | ICD-10-CM | POA: Diagnosis present

## 2014-02-04 LAB — RAPID URINE DRUG SCREEN, HOSP PERFORMED
Amphetamines: NOT DETECTED
Barbiturates: NOT DETECTED
Benzodiazepines: NOT DETECTED
Cocaine: NOT DETECTED
Opiates: NOT DETECTED
Tetrahydrocannabinol: NOT DETECTED

## 2014-02-04 LAB — BASIC METABOLIC PANEL
Anion gap: 14 (ref 5–15)
Anion gap: 11 (ref 5–15)
Anion gap: 11 (ref 5–15)
Anion gap: 12 (ref 5–15)
Anion gap: 13 (ref 5–15)
Anion gap: 12 (ref 5–15)
BUN: 12 mg/dL (ref 6–23)
BUN: 11 mg/dL (ref 6–23)
BUN: 10 mg/dL (ref 6–23)
BUN: 10 mg/dL (ref 6–23)
BUN: 10 mg/dL (ref 6–23)
BUN: 10 mg/dL (ref 6–23)
CO2: 24 mEq/L (ref 19–32)
CO2: 27 mEq/L (ref 19–32)
CO2: 26 mEq/L (ref 19–32)
CO2: 24 mEq/L (ref 19–32)
CO2: 23 mEq/L (ref 19–32)
CO2: 24 mEq/L (ref 19–32)
Calcium: 9.5 mg/dL (ref 8.4–10.5)
Calcium: 9.6 mg/dL (ref 8.4–10.5)
Calcium: 9.3 mg/dL (ref 8.4–10.5)
Calcium: 9.3 mg/dL (ref 8.4–10.5)
Calcium: 9 mg/dL (ref 8.4–10.5)
Calcium: 9.1 mg/dL (ref 8.4–10.5)
Chloride: 96 mEq/L (ref 96–112)
Chloride: 100 mEq/L (ref 96–112)
Chloride: 100 mEq/L (ref 96–112)
Chloride: 101 mEq/L (ref 96–112)
Chloride: 100 mEq/L (ref 96–112)
Chloride: 99 mEq/L (ref 96–112)
Creatinine, Ser: 0.78 mg/dL (ref 0.50–1.35)
Creatinine, Ser: 0.76 mg/dL (ref 0.50–1.35)
Creatinine, Ser: 0.71 mg/dL (ref 0.50–1.35)
Creatinine, Ser: 0.64 mg/dL (ref 0.50–1.35)
Creatinine, Ser: 0.62 mg/dL (ref 0.50–1.35)
Creatinine, Ser: 0.64 mg/dL (ref 0.50–1.35)
GFR calc Af Amer: >90 mL/min (ref >90)
GFR calc Af Amer: >90 mL/min (ref >90)
GFR calc Af Amer: >90 mL/min (ref >90)
GFR calc Af Amer: >90 mL/min (ref >90)
GFR calc Af Amer: >90 mL/min (ref >90)
GFR calc Af Amer: >90 mL/min (ref >90)
GFR calc non Af Amer: >90 mL/min (ref >90)
GFR calc non Af Amer: >90 mL/min (ref >90)
GFR calc non Af Amer: >90 mL/min (ref >90)
GFR calc non Af Amer: >90 mL/min (ref >90)
GFR calc non Af Amer: >90 mL/min (ref >90)
GFR calc non Af Amer: >90 mL/min (ref >90)
Glucose, Bld: 371 mg/dL — ABNORMAL HIGH (ref 70–99)
Glucose, Bld: 196 mg/dL — ABNORMAL HIGH (ref 70–99)
Glucose, Bld: 135 mg/dL — ABNORMAL HIGH (ref 70–99)
Glucose, Bld: 148 mg/dL — ABNORMAL HIGH (ref 70–99)
Glucose, Bld: 159 mg/dL — ABNORMAL HIGH (ref 70–99)
Glucose, Bld: 204 mg/dL — ABNORMAL HIGH (ref 70–99)
Potassium: 3.9 mEq/L (ref 3.7–5.3)
Potassium: 4.3 mEq/L (ref 3.7–5.3)
Potassium: 3.9 mEq/L (ref 3.7–5.3)
Potassium: 3.9 mEq/L (ref 3.7–5.3)
Potassium: 3.5 mEq/L — ABNORMAL LOW (ref 3.7–5.3)
Potassium: 3.5 mEq/L — ABNORMAL LOW (ref 3.7–5.3)
Sodium: 134 mEq/L — ABNORMAL LOW (ref 137–147)
Sodium: 138 mEq/L (ref 137–147)
Sodium: 137 mEq/L (ref 137–147)
Sodium: 137 mEq/L (ref 137–147)
Sodium: 136 mEq/L — ABNORMAL LOW (ref 137–147)
Sodium: 135 mEq/L — ABNORMAL LOW (ref 137–147)

## 2014-02-04 LAB — CBG MONITORING, ED
Glucose-Capillary: >600 mg/dL (ref 70–99)
Glucose-Capillary: >600 mg/dL (ref 70–99)
Glucose-Capillary: >600 mg/dL (ref 70–99)
Glucose-Capillary: 439 mg/dL — ABNORMAL HIGH (ref 70–99)

## 2014-02-04 LAB — GLUCOSE, CAPILLARY
Glucose-Capillary: 410 mg/dL — ABNORMAL HIGH (ref 70–99)
Glucose-Capillary: 426 mg/dL — ABNORMAL HIGH (ref 70–99)
Glucose-Capillary: 338 mg/dL — ABNORMAL HIGH (ref 70–99)
Glucose-Capillary: 257 mg/dL — ABNORMAL HIGH (ref 70–99)
Glucose-Capillary: 255 mg/dL — ABNORMAL HIGH (ref 70–99)
Glucose-Capillary: 218 mg/dL — ABNORMAL HIGH (ref 70–99)
Glucose-Capillary: 185 mg/dL — ABNORMAL HIGH (ref 70–99)
Glucose-Capillary: 171 mg/dL — ABNORMAL HIGH (ref 70–99)
Glucose-Capillary: 156 mg/dL — ABNORMAL HIGH (ref 70–99)
Glucose-Capillary: 138 mg/dL — ABNORMAL HIGH (ref 70–99)
Glucose-Capillary: 145 mg/dL — ABNORMAL HIGH (ref 70–99)
Glucose-Capillary: 140 mg/dL — ABNORMAL HIGH (ref 70–99)
Glucose-Capillary: 168 mg/dL — ABNORMAL HIGH (ref 70–99)

## 2014-02-04 LAB — COMPREHENSIVE METABOLIC PANEL
ALT: 14 U/L (ref 0–53)
AST: 18 U/L (ref 0–37)
Albumin: 3.8 g/dL (ref 3.5–5.2)
Alkaline Phosphatase: 128 U/L — ABNORMAL HIGH (ref 39–117)
Anion gap: 16 — ABNORMAL HIGH (ref 5–15)
BUN: 15 mg/dL (ref 6–23)
CO2: 23 mEq/L (ref 19–32)
Calcium: 9.9 mg/dL (ref 8.4–10.5)
Chloride: 87 mEq/L — ABNORMAL LOW (ref 96–112)
Creatinine, Ser: 0.91 mg/dL (ref 0.50–1.35)
GFR calc Af Amer: >90 mL/min (ref >90)
GFR calc non Af Amer: >90 mL/min (ref >90)
Glucose, Bld: 746 mg/dL (ref 70–99)
Potassium: 4.6 mEq/L (ref 3.7–5.3)
Sodium: 126 mEq/L — ABNORMAL LOW (ref 137–147)
Total Bilirubin: 0.3 mg/dL (ref 0.3–1.2)
Total Protein: 8.2 g/dL (ref 6.0–8.3)

## 2014-02-04 LAB — CBC
HCT: 39.2 % (ref 39.0–52.0)
HCT: 37.4 % — ABNORMAL LOW (ref 39.0–52.0)
Hemoglobin: 13.4 g/dL (ref 13.0–17.0)
Hemoglobin: 12.8 g/dL — ABNORMAL LOW (ref 13.0–17.0)
MCH: 27.3 pg (ref 26.0–34.0)
MCH: 26.8 pg (ref 26.0–34.0)
MCHC: 34.2 g/dL (ref 30.0–36.0)
MCHC: 34.2 g/dL (ref 30.0–36.0)
MCV: 79.8 fL (ref 78.0–100.0)
MCV: 78.4 fL (ref 78.0–100.0)
Platelets: 252 10*3/uL (ref 150–400)
Platelets: 240 10*3/uL (ref 150–400)
RBC: 4.91 MIL/uL (ref 4.22–5.81)
RBC: 4.77 MIL/uL (ref 4.22–5.81)
RDW: 12.9 % (ref 11.5–15.5)
RDW: 12.7 % (ref 11.5–15.5)
WBC: 6.5 10*3/uL (ref 4.0–10.5)
WBC: 5 10*3/uL (ref 4.0–10.5)

## 2014-02-04 LAB — MRSA PCR SCREENING: MRSA by PCR: POSITIVE — AB

## 2014-02-04 LAB — URINE MICROSCOPIC-ADD ON

## 2014-02-04 LAB — URINALYSIS, ROUTINE W REFLEX MICROSCOPIC
Bilirubin Urine: NEGATIVE
Glucose, UA: >1000 mg/dL — AB
Hgb urine dipstick: NEGATIVE
Ketones, ur: NEGATIVE mg/dL
Leukocytes, UA: NEGATIVE
Nitrite: NEGATIVE
Protein, ur: NEGATIVE mg/dL
Specific Gravity, Urine: 1.033 — ABNORMAL HIGH (ref 1.005–1.030)
Urobilinogen, UA: 0.2 mg/dL (ref 0.0–1.0)
pH: 5.5 (ref 5.0–8.0)

## 2014-02-04 LAB — ACETAMINOPHEN LEVEL: Acetaminophen (Tylenol), Serum: <15 ug/mL (ref 10–30)

## 2014-02-04 LAB — OSMOLALITY: Osmolality: 300 mOsm/kg (ref 275–300)

## 2014-02-04 LAB — ETHANOL: Alcohol, Ethyl (B): <11 mg/dL (ref 0–11)

## 2014-02-04 LAB — T-HELPER CELLS (CD4) COUNT (NOT AT ARMC)
CD4 % Helper T Cell: 10 % — ABNORMAL LOW (ref 33–55)
CD4 T Cell Abs: 170 /uL — ABNORMAL LOW (ref 400–2700)

## 2014-02-04 LAB — TESTOSTERONE: Testosterone: 274 ng/dL — ABNORMAL LOW (ref 300–890)

## 2014-02-04 LAB — SALICYLATE LEVEL: Salicylate Lvl: <2 mg/dL — ABNORMAL LOW (ref 2.8–20.0)

## 2014-02-04 MED ORDER — DAPSONE 100 MG PO TABS
100.0000 mg | ORAL_TABLET | Freq: Every day | ORAL | Status: DC
  Administered 2014-02-04 – 2014-02-05 (×2): 100 mg via ORAL
  Filled 2014-02-04 (×2): qty 1

## 2014-02-04 MED ORDER — TESTOSTERONE 50 MG/5GM (1%) TD GEL: 5.0000 g | Freq: Every day | TRANSDERMAL | Status: DC

## 2014-02-04 MED ORDER — POTASSIUM CHLORIDE 10 MEQ/100ML IV SOLN
10.0000 meq | INTRAVENOUS | Status: AC
  Administered 2014-02-04 (×3): 10 meq via INTRAVENOUS
  Filled 2014-02-04 (×4): qty 100

## 2014-02-04 MED ORDER — INSULIN ASPART 100 UNIT/ML ~~LOC~~ SOLN
0.0000 [IU] | Freq: Three times a day (TID) | SUBCUTANEOUS | Status: DC
  Administered 2014-02-05: 8 [IU] via SUBCUTANEOUS
  Administered 2014-02-05: 11 [IU] via SUBCUTANEOUS

## 2014-02-04 MED ORDER — INFLUENZA VAC SPLIT QUAD 0.5 ML IM SUSY
0.5000 mL | PREFILLED_SYRINGE | INTRAMUSCULAR | Status: AC
  Administered 2014-02-05: 0.5 mL via INTRAMUSCULAR
  Filled 2014-02-04: qty 0.5

## 2014-02-04 MED ORDER — RALTEGRAVIR POTASSIUM 400 MG PO TABS
400.0000 mg | ORAL_TABLET | Freq: Two times a day (BID) | ORAL | Status: DC
  Administered 2014-02-04 – 2014-02-05 (×3): 400 mg via ORAL
  Filled 2014-02-04 (×5): qty 1

## 2014-02-04 MED ORDER — DEXTROSE-NACL 5-0.45 % IV SOLN: INTRAVENOUS | Status: DC

## 2014-02-04 MED ORDER — DARUNAVIR ETHANOLATE 800 MG PO TABS
800.0000 mg | ORAL_TABLET | Freq: Every day | ORAL | Status: DC
  Administered 2014-02-04: 800 mg via ORAL
  Filled 2014-02-04 (×3): qty 1

## 2014-02-04 MED ORDER — DEXTROSE 50 % IV SOLN: 25.0000 mL | INTRAVENOUS | Status: DC | PRN

## 2014-02-04 MED ORDER — INSULIN ASPART 100 UNIT/ML ~~LOC~~ SOLN
10.0000 [IU] | Freq: Once | SUBCUTANEOUS | Status: AC
  Administered 2014-02-04: 10 [IU] via SUBCUTANEOUS
  Filled 2014-02-04: qty 1

## 2014-02-04 MED ORDER — ASPIRIN EC 81 MG PO TBEC
81.0000 mg | DELAYED_RELEASE_TABLET | Freq: Every day | ORAL | Status: DC
  Administered 2014-02-04 – 2014-02-05 (×2): 81 mg via ORAL
  Filled 2014-02-04 (×2): qty 1

## 2014-02-04 MED ORDER — RITONAVIR 100 MG PO TABS
100.0000 mg | ORAL_TABLET | Freq: Every day | ORAL | Status: DC
  Administered 2014-02-04: 100 mg via ORAL
  Filled 2014-02-04 (×3): qty 1

## 2014-02-04 MED ORDER — CAPSAICIN 0.025 % EX CREA
TOPICAL_CREAM | Freq: Two times a day (BID) | CUTANEOUS | Status: DC
  Administered 2014-02-04: 13:00:00 via TOPICAL
  Filled 2014-02-04 (×2): qty 56.6

## 2014-02-04 MED ORDER — HEPARIN SODIUM (PORCINE) 5000 UNIT/ML IJ SOLN
5000.0000 [IU] | Freq: Three times a day (TID) | INTRAMUSCULAR | Status: DC
  Administered 2014-02-04 – 2014-02-05 (×3): 5000 [IU] via SUBCUTANEOUS
  Filled 2014-02-04 (×4): qty 1

## 2014-02-04 MED ORDER — SODIUM CHLORIDE 0.9 % IV SOLN
INTRAVENOUS | Status: DC
  Administered 2014-02-04: 3.8 [IU]/h via INTRAVENOUS
  Administered 2014-02-04: 3.9 [IU]/h via INTRAVENOUS
  Administered 2014-02-04: 2.8 [IU]/h via INTRAVENOUS
  Administered 2014-02-04: 2 [IU]/h via INTRAVENOUS

## 2014-02-04 MED ORDER — DEXTROSE-NACL 5-0.45 % IV SOLN
INTRAVENOUS | Status: DC
  Administered 2014-02-04 – 2014-02-05 (×3): via INTRAVENOUS

## 2014-02-04 MED ORDER — SODIUM CHLORIDE 0.9 % IV SOLN
INTRAVENOUS | Status: DC
  Administered 2014-02-04: 5.4 [IU]/h via INTRAVENOUS
  Filled 2014-02-04: qty 2.5

## 2014-02-04 MED ORDER — SODIUM CHLORIDE 0.9 % IV BOLUS (SEPSIS)
1000.0000 mL | Freq: Once | INTRAVENOUS | Status: AC
  Administered 2014-02-04: 1000 mL via INTRAVENOUS

## 2014-02-04 MED ORDER — SODIUM CHLORIDE 0.9 % IV SOLN: INTRAVENOUS | Status: AC

## 2014-02-04 MED ORDER — INSULIN GLARGINE 100 UNIT/ML ~~LOC~~ SOLN
40.0000 [IU] | Freq: Every day | SUBCUTANEOUS | Status: DC
  Administered 2014-02-04: 40 [IU] via SUBCUTANEOUS
  Filled 2014-02-04 (×2): qty 0.4

## 2014-02-04 MED ORDER — SODIUM CHLORIDE 0.9 % IV SOLN
INTRAVENOUS | Status: DC
  Administered 2014-02-04 (×2): via INTRAVENOUS

## 2014-02-04 MED ORDER — INSULIN ASPART 100 UNIT/ML ~~LOC~~ SOLN
6.0000 [IU] | Freq: Three times a day (TID) | SUBCUTANEOUS | Status: DC
  Administered 2014-02-05 (×2): 6 [IU] via SUBCUTANEOUS

## 2014-02-04 MED ORDER — EMTRICITABINE-TENOFOVIR DF 200-300 MG PO TABS
1.0000 | ORAL_TABLET | Freq: Every day | ORAL | Status: DC
  Administered 2014-02-04: 1 via ORAL
  Filled 2014-02-04 (×3): qty 1

## 2014-02-04 MED ORDER — ACETAMINOPHEN 325 MG PO TABS
650.0000 mg | ORAL_TABLET | Freq: Four times a day (QID) | ORAL | Status: DC | PRN
  Filled 2014-02-04: qty 2

## 2014-02-04 MED ORDER — PRAVASTATIN SODIUM 40 MG PO TABS
40.0000 mg | ORAL_TABLET | Freq: Every day | ORAL | Status: DC
  Filled 2014-02-04 (×2): qty 1

## 2014-02-04 NOTE — Progress Notes (Signed)
UR completed 

## 2014-02-04 NOTE — ED Notes (Signed)
Dr. Horton at bedside. 

## 2014-02-04 NOTE — ED Notes (Signed)
CBG still reading greater than 600.

## 2014-02-04 NOTE — ED Notes (Signed)
Presents with CBG greater than 600, pt repors he has been taking DM medication as prescribed. Alert and oriented, denies pain. Endorses polydipsia, polyuria

## 2014-02-04 NOTE — H&P (Signed)
Pt seen and examined. Please refer to resident note for details.  In brief, 48 y/o male with PMH of HIV, DM, HTN who p/w elevated BS. Pt states that he ahs been taking his medications at home including metformin and lantus. He is unsure about taking the sulfonylurea. He notes that his BS have been high over the last 2-3 days and complained of polyuria/polydipsia and "not feeling well".  No abd pain, no n/v, no lightheadedness/syncope. Pt also c/o chronic right knee pain worsened after a fall yesterday. Remaining ROS negative  Exam: Gen: AAO*3, NAD CV: RRR, normal heart sounds Pulm: CTA b/l Abd: soft, non tender, BS + Ext: no pedal edema  Assessment and Plan: 48 y/o male with severe hyperglycemia likely secondary to dietary and medical non compliance  Hyperglycemia/HHS: - AG closed. Would d/c insulin gtt after starting lantus  - Start PO diet once insulin gtt dc'd - c/w IVF for now - Pt improving. Will monitor  R knee pain: - x ray with no fx on 10/18 - c/w tylenol prn and capsaicin cream - Will consider voltaren gel if no improvement  HIV: - c/w home meds - outpatient f/u with ID

## 2014-02-04 NOTE — Progress Notes (Signed)
Subjective: Patient reports he is feeling better this morning. He complains of pain in his right knee which is chronic. His blood sugars trended down from [phone] overnight. He remained on the glucomander. His AG closed with gaps of 14 and 11.  Will transition off insulin drip once there are 2 gaps less than 12.   Objective: Vital signs in last 24 hours: Filed Vitals:   02/04/14 0615 02/04/14 0634 02/04/14 0717 02/04/14 0945  BP: 111/75  109/62 125/79  Pulse: 60  63 61  Temp:  97.2 F (36.2 C) 98 F (36.7 C) 98 F (36.7 C)  TempSrc:  Oral Oral   Resp: 15  16 18   Height:   5\' 9"  (1.753 m)   Weight:   250 lb 4.8 oz (113.535 kg)   SpO2: 99%  100% 100%   Weight change:   Intake/Output Summary (Last 24 hours) at 02/04/14 1546 Last data filed at 02/04/14 1005  Gross per 24 hour  Intake   2000 ml  Output   3225 ml  Net  -1225 ml   Physical Exam General: alert, sitting up in bed, NAD HEENT: [place]/AT, EOMI, mucus membranes moist CV: RRR, normal S1/S2, no m/g/r Pulm: CTA bilaterally, breaths non-labored Abd: BS+, soft, non-tender  Ext: warm, no edema Neuro: alert and oriented x 3, CNs II-XII intact  Lab Results: Basic Metabolic Panel:  Recent Labs Lab 02/04/14 0807 02/04/14 1310  NA 134* 138  K 3.9 4.3  CL 96 100  CO2 24 27  GLUCOSE 371* 196*  BUN 12 11  CREATININE 0.78 0.76  CALCIUM 9.5 9.6   Liver Function Tests:  Recent Labs Lab 02/04/14 0159  AST 18  ALT 14  ALKPHOS 128*  BILITOT 0.3  PROT 8.2  ALBUMIN 3.8   No results found for this basename: LIPASE, AMYLASE,  in the last 168 hours No results found for this basename: AMMONIA,  in the last 168 hours CBC:  Recent Labs Lab 02/04/14 0159 02/04/14 0807  WBC 6.5 5.0  HGB 13.4 12.8*  HCT 39.2 37.4*  MCV 79.8 78.4  PLT 252 240   Cardiac Enzymes: No results found for this basename: CKTOTAL, CKMB, CKMBINDEX, TROPONINI,  in the last 168 hours BNP: No results found for this basename: PROBNP,  in  the last 168 hours D-Dimer: No results found for this basename: DDIMER,  in the last 168 hours CBG:  Recent Labs Lab 02/04/14 0807 02/04/14 0932 02/04/14 1051 02/04/14 1158 02/04/14 1352 02/04/14 1507  GLUCAP 338* 257* 255* 218* 185* 171*   Hemoglobin A1C: No results found for this basename: HGBA1C,  in the last 168 hours Fasting Lipid Panel: No results found for this basename: CHOL, HDL, LDLCALC, TRIG, CHOLHDL, LDLDIRECT,  in the last 168 hours Thyroid Function Tests: No results found for this basename: TSH, T4TOTAL, FREET4, T3FREE, THYROIDAB,  in the last 168 hours Coagulation: No results found for this basename: LABPROT, INR,  in the last 168 hours Anemia Panel: No results found for this basename: VITAMINB12, FOLATE, FERRITIN, TIBC, IRON, RETICCTPCT,  in the last 168 hours Urine Drug Screen: Drugs of Abuse     Component Value Date/Time   LABOPIA NONE DETECTED 02/04/2014 0630   LABOPIA NEG 11/22/2011 1402   COCAINSCRNUR NONE DETECTED 02/04/2014 0630   COCAINSCRNUR NEG 11/22/2011 1402   LABBENZ NONE DETECTED 02/04/2014 0630   LABBENZ NEG 11/22/2011 1402   LABBENZ NEG 08/07/2011 1217   AMPHETMU NONE DETECTED 02/04/2014 0630   AMPHETMU NEG  08/07/2011 1217   THCU NONE DETECTED 02/04/2014 0630   LABBARB NONE DETECTED 02/04/2014 0630   LABBARB NEG 11/22/2011 1402    Alcohol Level:  Recent Labs Lab 02/04/14 0807  ETH <11   Urinalysis:  Recent Labs Lab 02/04/14 0200  COLORURINE YELLOW  LABSPEC 1.033*  PHURINE 5.5  GLUCOSEU >1000*  HGBUR NEGATIVE  BILIRUBINUR NEGATIVE  KETONESUR NEGATIVE  PROTEINUR NEGATIVE  UROBILINOGEN 0.2  NITRITE NEGATIVE  LEUKOCYTESUR NEGATIVE    Micro Results: No results found for this or any previous visit (from the past 240 hour(s)). Studies/Results: No results found. Medications: I have reviewed the patient's current medications. Scheduled Meds: . aspirin EC  81 mg Oral Daily  . capsaicin   Topical BID  . dapsone  100 mg Oral Daily   . Darunavir Ethanolate  800 mg Oral QHS  . emtricitabine-tenofovir  1 tablet Oral QHS  . heparin  5,000 Units Subcutaneous 3 times per day  . [START ON 02/05/2014] Influenza vac split quadrivalent PF  0.5 mL Intramuscular Tomorrow-1000  . pravastatin  40 mg Oral q1800  . raltegravir  400 mg Oral BID  . ritonavir  100 mg Oral QHS  . testosterone  5 g Transdermal Daily   Continuous Infusions: . sodium chloride 150 mL/hr at 02/04/14 0738  . dextrose 5 % and 0.45% NaCl 125 mL/hr at 02/04/14 1242  . insulin (NOVOLIN-R) infusion 3.9 Units/hr (02/04/14 1052)   PRN Meds:.acetaminophen, dextrose Assessment/Plan: Mr. Obriant is a 48yo man with HIV, Type 2 DM, and HTN who presented with hyperglycemia likely due to medication noncompliance.  1. Hyperglycemia/Likely HHS: Last A1c 5.4 on 05/04/2013. On Lantus 50 units QHS, Glipizide 5mg  daily, and Metformin 500mg  BID at home. He was placed on Glucomander overnight. His blood sugars decreased from >700 on admission to 426>338>257>218 and now in 150s. His anion gap closed to 14. Repeat AG 11. Will check an additional gap, if below 12 then will start Lantus 40 units QHS and Novolog 6 units TID with meals. Will stop insulin drip 2 hours after giving Lantus dose. Will start diet. - Check repeat gap, if below 12, then can follow as above - If gap >12 then will keep on insulin drip until 2 AGs are less than 12 - Continue IVF @ 150 ml/hr - Continue D5 1/2 NS @ 125 ml/hr - Continue insulin drip for now  2. S/p Fall: Patient fell onto right knee on 10/29. He has mild tenderness on exam. Right knee x-ray on 10/18 was negative for fracture. Will treat conservatively.  - Capsaicin cream - Tylenol 650 mg Q6H PRN  3. HIV: Followed by Dr. [name]. Last seen in Feb, CD4 120, VL<20 on 06/02/2013 and supposed to f/u in 14mo. On HAART: Prestiza, Truvada, Isentress, Norvir, and Dapsone for PCP prophylaxis due to Sulfa allergy. Repeat CD4 cells 170.  - Continuing Prezista  800mg  QHS, Truvada 200-300mg  WHS, Isentress 400mg  BID, Norvir 100mg  QHS  - Continuing Dapsone 100mg  daily  - VL pending - Pt will need ID f/u appointment  4. HLD: Lipid panel 06/02/13 w/ total cholesterol 268, TG 196. LDL 186. On Pravastatin 40mg  daily at home.  - Continue Pravastatin 40mg  daily  5. ED: Likely related to uncontrolled diabetes. Last serum testosterone in '10 was 238.95. Normal range 350-890. On testosterone topical at home. Repeat testosterone 274.  - Continue testosterone gel daily - f/u as outpatient  6. HTN: BP stable in [id number] systolic. Patient was taking Lisinopril 40 mg daily at home.  However, per clinic note, Lisinopril was discontinued due to cough side effect. Will continue to hold for now given BPs are normotensive. - Consider starting ARB if pressures become elevated or upon discharge   Diet: NPO DVT/PE PPx: Heparin SQ Dispo: Disposition is deferred at this time, awaiting improvement of current medical problems.  Anticipated discharge in approximately 1-2 day(s).   The patient does have a current PCP Clinton Gallant, MD) and does need an [facility] hospital follow-up appointment after discharge.  The patient does not have transportation limitations that hinder transportation to clinic appointments.  .Services Needed at time of discharge: Y = Yes, Blank = No PT:   OT:   RN:   Equipment:   Other:     LOS: 0 days   Albin Felling, MD 02/04/2014, 3:46 PM

## 2014-02-04 NOTE — ED Provider Notes (Signed)
CSN: [id number]     Arrival date & time 02/04/14  0125 History   First MD Initiated Contact with Patient 02/04/14 0227     Chief Complaint  Patient presents with  . Hyperglycemia     (Consider location/radiation/quality/duration/timing/severity/associated sxs/prior Treatment) HPI  This is a 48 year old male with a history of HIV, diabetes, hypertension who presents with hyperglycemia. Patient reports increasing blood sugars at home since yesterday. He last checked it at home yesterday and was 300. He reports compliance with insulin but has had several recent ER visits where he has been noted to be hyperglycemic. He denies any chest pain, shortness of breath, infectious symptoms. He endorses polydipsia and polyuria.  Past Medical History  Diagnosis Date  . HIV (human immunodeficiency virus infection) 2009    CD4 count 100, VL 13800 (05/01/2010)  . Diabetes type 2, uncontrolled     HgA1c 17.6 (04/27/2010)  . Hypertension   . Genital warts   . Erectile dysfunction   . Chronic knee pain     right  . Osteomyelitis     h/o hand  . Diabetes mellitus   . Chronic pain   . AIDS    Past Surgical History  Procedure Laterality Date  . Multiple extractions with alveoloplasty N/A 01/18/2013    Procedure: MULTIPLE EXTRACION 3, 6, 7, 10, 11, 13, 21, 22, 27, 28, 29, 30 WITH ALVEOLOPLASTY;  Surgeon: Gae Bon, DDS;  Location: [facility];  Service: Oral Surgery;  Laterality: N/A;   Family History  Problem Relation Age of Onset  . Hypertension Mother   . Arthritis Father   . Hypertension Father   . Hypertension Brother   . Cancer Maternal Grandmother 52    unknown type of cancer  . Depression Paternal Grandmother    History  Substance Use Topics  . Smoking status: Never Smoker   . Smokeless tobacco: Never Used  . Alcohol Use: No    Review of Systems  Constitutional: Negative.  Negative for fever.  Respiratory: Negative.  Negative for chest tightness and shortness of breath.    Cardiovascular: Negative.  Negative for chest pain.  Gastrointestinal: Negative.  Negative for abdominal pain.  Endocrine: Positive for polydipsia and polyuria.  Genitourinary: Negative.  Negative for dysuria.  Neurological: Negative for headaches.  All other systems reviewed and are negative.     Allergies  Sulfa antibiotics  Home Medications   Prior to Admission medications   Medication Sig Start Date End Date Taking? Authorizing Provider  aspirin 81 MG EC tablet Take 81 mg by mouth daily.  08/20/11  Yes [name], MD  dapsone 100 MG tablet Take 100 mg by mouth daily.   Yes Historical Provider, MD  Darunavir Ethanolate (PREZISTA) 800 MG tablet Take 800 mg by mouth at bedtime.    Yes Historical Provider, MD  emtricitabine-tenofovir (TRUVADA) 200-300 MG per tablet Take 1 tablet by mouth at bedtime.    Yes Historical Provider, MD  glipiZIDE (GLUCOTROL XL) 5 MG 24 hr tablet Take 5 mg by mouth at bedtime.    Yes Historical Provider, MD  Insulin Glargine (LANTUS SOLOSTAR) 100 UNIT/ML Solostar Pen Inject 50 Units into the skin daily at 10 pm. 01/03/14  Yes Karren Cobble, MD  Insulin Pen Needle 31G X 5 MM MISC 1 application by Does not apply route once. diag code 250.02. Insulin dependent. Used for insulin inj 1 time daily 11/23/12  Yes Clinton Gallant, MD  lisinopril (PRINIVIL,ZESTRIL) 40 MG tablet Take 40 mg by  mouth daily.   Yes Historical Provider, MD  metFORMIN (GLUCOPHAGE) 500 MG tablet Take 1 tablet (500 mg total) by mouth 2 (two) times daily with a meal. 01/03/14  Yes Karren Cobble, MD  oxyCODONE (ROXICODONE) 5 MG immediate release tablet Take 1-2 tablets every 4-6 hours as needed for pain control 01/23/14  Yes Nicole Pisciotta, PA-C  pravastatin (PRAVACHOL) 40 MG tablet Take 1 tablet (40 mg total) by mouth daily. 01/03/14  Yes Karren Cobble, MD  raltegravir (ISENTRESS) 400 MG tablet Take 400 mg by mouth 2 (two) times daily.   Yes Historical Provider, MD  ritonavir (NORVIR)  100 MG TABS tablet Take 100 mg by mouth at bedtime.    Yes Historical Provider, MD  testosterone (ANDROGEL) 50 MG/5GM GEL Place 5 g onto the skin daily.    Yes Historical Provider, MD   BP 105/43  Pulse 67  Temp(Src) 97.3 F (36.3 C) (Oral)  Resp 19  SpO2 99% Physical Exam  Nursing note and vitals reviewed. Constitutional: He is oriented to person, place, and time. No distress.  Overweight  HENT:  Head: Normocephalic and atraumatic.  Mucous membranes dry  Eyes: Pupils are equal, round, and reactive to light.  Neck: Neck supple.  Cardiovascular: Normal rate, regular rhythm and normal heart sounds.   No murmur heard. Pulmonary/Chest: Effort normal and breath sounds normal. No respiratory distress. He has no wheezes.  Abdominal: Soft. Bowel sounds are normal. There is no tenderness. There is no rebound.  Musculoskeletal: He exhibits no edema.  Lymphadenopathy:    He has no cervical adenopathy.  Neurological: He is alert and oriented to person, place, and time.  Skin: Skin is warm and dry.  Psychiatric: He has a normal mood and affect.    ED Course  Procedures (including critical care time) Labs Review Labs Reviewed  COMPREHENSIVE METABOLIC PANEL - Abnormal; Notable for the following:    Sodium 126 (*)    Chloride 87 (*)    Glucose, Bld 746 (*)    Alkaline Phosphatase 128 (*)    Anion gap 16 (*)    All other components within normal limits  URINALYSIS, ROUTINE W REFLEX MICROSCOPIC - Abnormal; Notable for the following:    APPearance CLOUDY (*)    Specific Gravity, Urine 1.033 (*)    Glucose, UA >1000 (*)    All other components within normal limits  CBG MONITORING, ED - Abnormal; Notable for the following:    Glucose-Capillary >600 (*)    All other components within normal limits  CBC  URINE MICROSCOPIC-ADD ON  CBG MONITORING, ED  CBG MONITORING, ED    Imaging Review No results found.   EKG Interpretation None      MDM   Final diagnoses:  Hyperglycemia    Patient presents with hyperglycemia.  Reports polydipsia and polyuria but no other systemic complaints. Vital signs are reassuring on exam. Patient given 2 L of fluid and 10 units of subcutaneous insulin for glucose greater than 600. CMP with pseudohyponatremia at 126, anion gap of 16, and glucose of 746. Repeat glucose following treatment continues to be elevated. Will place on the glucose stabilizer. Suspect noncompliance given repeated visits with noted hyperglycemia. Discussed with internal medicine teaching service.    Merryl Hacker, MD 02/04/14 [phone]

## 2014-02-04 NOTE — ED Notes (Signed)
Admitting MD at bedside.

## 2014-02-04 NOTE — H&P (Signed)
Date: 02/04/2014               Patient Name:  Keith [name] MRN: [name]  DOB: [birth year] Age / Sex: 48 y.o., male   PCP: Keith Gallant, MD         Medical Service: Internal Medicine Teaching Service         Attending Physician: Dr. Aldine Contes, MD    First Contact: Dr. Arcelia [name] Pager: [phone]  Second Contact: Dr. Redmond [name] Pager: [phone]       After Hours (After 5p/  First Contact Pager: [phone]  weekends / holidays): Second Contact Pager: [phone]   Chief Complaint: Hyperglycemia  History of Present Illness: [name] is a 48 year old with HIV, DM2, HTN presenting with hyperglycemia. His blood sugars have been increasing since Monday.  He reports they have been in the 700s. He reports compliance of his diabetes medications but states that he takes "Novolog" 50u daily and another medication twice a day. He last took insulin yesterday morning.  He reports he eats about one solid meal a day - his last meal was fried fish, potatoes and hush puppies. He reports polyuria and polydipsia.  He reports exacerbation of his chronic right knee pain after falling today. His knee gave out on him. Denies head trauma or LOC. Denies fevers, chills, vision changes, cough, shortness of breath, chest pain, nausea, vomiting, abdominal pain, diarrhea, dysuria, heamturia, rash, bleeding/bruising, myalgias, weakness.  He was last seen in the ED 01/23/2014 for hyperglycemia, exacerbation of chronic right knee pain and productive cough. He had no anion gap, no ketones in his urine. His blood sugar decreased from 471 to to 327 after 5u of novolog and 2L NS. He's had multiple other visits to the ED for the same problem.  Meds: Current Facility-Administered Medications  Medication Dose Route Frequency Provider Last Rate Last Dose  . dextrose 5 %-0.45 % sodium chloride infusion   Intravenous Continuous Keith Hair Horton, MD      . insulin regular (NOVOLIN R,HUMULIN R) 250 Units in sodium chloride 0.9 % 250 mL (1  Units/mL) infusion   Intravenous Continuous Keith Hacker, MD       Current Outpatient Prescriptions  Medication Sig Dispense Refill  . aspirin 81 MG EC tablet Take 81 mg by mouth daily.       . dapsone 100 MG tablet Take 100 mg by mouth daily.      . Darunavir Ethanolate (PREZISTA) 800 MG tablet Take 800 mg by mouth at bedtime.       Marland Kitchen emtricitabine-tenofovir (TRUVADA) 200-300 MG per tablet Take 1 tablet by mouth at bedtime.       Marland Kitchen glipiZIDE (GLUCOTROL XL) 5 MG 24 hr tablet Take 5 mg by mouth at bedtime.       . Insulin Glargine (LANTUS SOLOSTAR) 100 UNIT/ML Solostar Pen Inject 50 Units into the skin daily at 10 pm.  15 mL  11  . Insulin Pen Needle 31G X 5 MM MISC 1 application by Does not apply route once. diag code 250.02. Insulin dependent. Used for insulin inj 1 time daily  100 each  6  . lisinopril (PRINIVIL,ZESTRIL) 40 MG tablet Take 40 mg by mouth daily.      . metFORMIN (GLUCOPHAGE) 500 MG tablet Take 1 tablet (500 mg total) by mouth 2 (two) times daily with a meal.  60 tablet  11  . oxyCODONE (ROXICODONE) 5 MG immediate release tablet Take 1-2 tablets every 4-6 hours as needed for  pain control  7 tablet  0  . pravastatin (PRAVACHOL) 40 MG tablet Take 1 tablet (40 mg total) by mouth daily.  30 tablet  11  . raltegravir (ISENTRESS) 400 MG tablet Take 400 mg by mouth 2 (two) times daily.      . ritonavir (NORVIR) 100 MG TABS tablet Take 100 mg by mouth at bedtime.       Marland Kitchen testosterone (ANDROGEL) 50 MG/5GM GEL Place 5 g onto the skin daily.         Allergies: Allergies as of 02/04/2014 - Review Complete 02/04/2014  Allergen Reaction Noted  . Sulfa antibiotics Itching 03/26/2011   Past Medical History  Diagnosis Date  . HIV (human immunodeficiency virus infection) 2009    CD4 count 100, VL 13800 (05/01/2010)  . Diabetes type 2, uncontrolled     HgA1c 17.6 (04/27/2010)  . Hypertension   . Genital warts   . Erectile dysfunction   . Chronic knee pain     right  .  Osteomyelitis     h/o hand  . Diabetes mellitus   . Chronic pain   . AIDS    Past Surgical History  Procedure Laterality Date  . Multiple extractions with alveoloplasty N/A 01/18/2013    Procedure: MULTIPLE EXTRACION 3, 6, 7, 10, 11, 13, 21, 22, 27, 28, 29, 30 WITH ALVEOLOPLASTY;  Surgeon: Keith [name], DDS;  Location: [facility];  Service: Oral Surgery;  Laterality: N/A;   Family History  Problem Relation Age of Onset  . Hypertension Mother   . Arthritis Father   . Hypertension Father   . Hypertension Brother   . Cancer Maternal Grandmother 40    unknown type of cancer  . Depression Paternal Grandmother    History   Social History  . Marital Status: Single    Spouse Name: N/A    Number of Children: 5  . Years of Education: 12   Occupational History  . Not on file.   Social History Main Topics  . Smoking status: Never Smoker   . Smokeless tobacco: Never Used  . Alcohol Use: No  . Drug Use: No  . Sexual Activity: Yes    Partners: Female     Comment: given condoms   Other Topics Concern  . Not on file   Social History Narrative   Worked for the city of [place] for 18 years.   Unemployed.    Applying for disability.   Medicaid patient.    Review of Systems: Constitutional: no fevers/chills Eyes: no vision changes Ears, nose, mouth, throat, and face: no cough Respiratory: no shortness of breath Cardiovascular: no chest pain Gastrointestinal: no nausea/vomiting, no abdominal pain, no constipation, no diarrhea Genitourinary: no dysuria, no hematuria Integument: no rash Hematologic/lymphatic: no bleeding/bruising, no edema Musculoskeletal: +R knee pain, no myalgias Neurological: +BLE neuropathy, no weakness   Physical Exam: Blood pressure 105/43, pulse 67, temperature 97.3 F (36.3 C), temperature source Oral, resp. rate 19, SpO2 99.00%. General Apperance: NAD, drowsy Head: Normocephalic, atraumatic Eyes: PERRL, EOMI, anicteric sclera Ears: Normal  external ear canal Nose: Nares normal, septum midline, mucosa normal Throat: Lips, mucosa and tongue normal  Neck: Supple, trachea midline Back: No tenderness or bony abnormality  Lungs: Clear to auscultation bilaterally. No wheezes, rhonchi or rales. Breathing comfortably on room air Chest Wall: Nontender, no deformity Heart: Regular rate and rhythm, no murmur/rub/gallop Abdomen: Soft, nontender, nondistended, no rebound/guarding Extremities: Normal, atraumatic, warm and well perfused, no edema. R knee tender to palpation. No  deformities palpated. No effusions. No erythema or warmth. Full ROM. Pulses: 2+ throughout Skin: No rashes or lesions Neurologic: Drowsy but arousable and oriented x 3. Normal strength and sensation   Lab results: Basic Metabolic Panel:  Recent Labs  02/04/14 0159  NA 126*  K 4.6  CL 87*  CO2 23  GLUCOSE 746*  BUN 15  CREATININE 0.91  CALCIUM 9.9   Liver Function Tests:  Recent Labs  02/04/14 0159  AST 18  ALT 14  ALKPHOS 128*  BILITOT 0.3  PROT 8.2  ALBUMIN 3.8   CBC:  Recent Labs  02/04/14 0159  WBC 6.5  HGB 13.4  HCT 39.2  MCV 79.8  PLT 252   CBG:  Recent Labs  02/04/14 0131  GLUCAP >600*   Hemoglobin A1C: Hgb A1c 5.6 on 05/04/2013  Urine Drug Screen: Drugs of Abuse     Component Value Date/Time   LABOPIA NEG 11/22/2011 1402   LABOPIA NEG 08/07/2011 1217   COCAINSCRNUR NEG 11/22/2011 1402   COCAINSCRNUR NONE DETECTED 11/03/2010 2122   LABBENZ NEG 11/22/2011 1402   LABBENZ NEG 08/07/2011 1217   LABBENZ NONE DETECTED 11/03/2010 2122   AMPHETMU NEG 08/07/2011 1217   AMPHETMU NONE DETECTED 11/03/2010 2122   THCU NONE DETECTED 11/03/2010 2122   LABBARB NEG 11/22/2011 1402   LABBARB NONE DETECTED 11/03/2010 2122     Urinalysis:  Recent Labs  02/04/14 0200  [place]  LABSPEC 1.033*  PHURINE 5.5  GLUCOSEU >1000*  HGBUR NEGATIVE  BILIRUBINUR NEGATIVE  KETONESUR NEGATIVE  PROTEINUR NEGATIVE  UROBILINOGEN 0.2    NITRITE NEGATIVE  LEUKOCYTESUR NEGATIVE    Assessment & Plan by Problem: Principal Problem:   Acute hyperglycemia Active Problems:   Human immunodeficiency virus (HIV) disease   Type 2 diabetes with circulatory disorder causing erectile dysfunction   Hyperlipidemia  Hyperglycemia: Last A1c 5.4 on 05/04/2013. On Lantus 50u qhs, glipizide 5mg  daily, and Metformin 500mg  BID at home. CBG >600, 746 on BMP. CBG unchanged after 10u Novolog and 2L IVF. Likely HHS. AG 16, no ketones in urine, so DKA unlikely. Lactic acid 0.99. Glucomander started in ED.  -Admit to IMTS to SDU -Continue DKA protocol: NS @ 150cc/hr, change to D5 1/2NS @75cc /hr when CBG <250, insulin gtt -Checking serum ketones, salicylates, and acetaminophen, serum osmols. -Checking alcohol level and UDS -Hgb A1c level  S/p fall: Per pt, his R knee gave way and he fell onto his knee yesterday. He denies hitting his head. His knee is tender on exam, but there is no edema, effusion, deformities, or warmth to the knee, and he has full ROM. He states that he takes oxycodone IR at home when he can get a prescription, and that he has an appt today with his doctor to have his pain medication refilled. He was in the ED on 10/18 c/o hyperglycemia and knee pain, and was discharged home with an rx for Oxy IR at that time. He seemed a bit drowsy on exam except when discussing his pain and oxycodone. Will hold off on narcotics at this time given his drowsiness, and will provide ibuprofen for pain control.  -Ibuprofen prn pain  HIV: Followed by Dr. [name]. Last seen in Feb, CD4 120, VL<20 on 06/02/2013 and supposed to f/u in 53mo. On HAART: Prestiza, Truvada, Isentress, Norvir, and Dapsone for PCP prophylaxis due to Sulfa allergy.  -Continuing Prezista 800mg  QHS, Truvada 200-300mg  WHS, Isentress 400mg  BID, Norvir 100mg  QHS -Continuing Dapsone 100mg  daily -Checking CD4 and VL, will  need ID f/u soon   HLD: Lipid panel 06/02/13 w/ total cholesterol  268, TG 196. LDL 186. On Pravastatin 40mg  daily. -continue pravastatin 40mg  daily  ED: H/o ED attributed in part to his previously uncontrolled DM2. Last serum testosterone in '10 was 238.95. Normal range 350-890. On testosterone topical at home.  -Checking testosterone level -Continuing testosterone gel daily   HTN: On Lisinopril 40mg  daily at home. BP in 110s on admission. Holding Lisinopril for now. Lisinopril was discontinued at his last clinic appointment 05/04/2013 as it was thought to be causing his cough. He was to follow up to consider starting an ARB but this was never done. Note from January also mentions he was on atenolol 50mg  daily. -continue ASA 81mg  daily  FEN: -NPO -NS @ 150cc/hr, change to D5 1/2NS @75cc /hr when CBG <250  DVT PPx: SQ hep 5000u TID   Dispo: Disposition is deferred at this time, awaiting improvement of current medical problems. Anticipated discharge in approximately 0-1 day(s).   The patient does have a current PCP Keith Gallant, MD) and does need an [facility] hospital follow-up appointment after discharge.  The patient does not have transportation limitations that hinder transportation to clinic appointments.  Signed: Jacques Earthly, MD 02/04/2014, 5:14 AM

## 2014-02-04 NOTE — Progress Notes (Signed)
Inpatient Diabetes Program Recommendations  AACE/ADA: New Consensus Statement on Inpatient Glycemic Control (2013)  Target Ranges:  Prepandial:   less than 140 mg/dL      Peak postprandial:   less than 180 mg/dL (1-2 hours)      Critically ill patients:  140 - 180 mg/dL    Inpatient Diabetes Program Recommendations Insulin - Basal: Pt weight of 113 kg, pt would most probably need at a minimum 0.4 units/kg is 45 units lantus. With the insulin resistance this patient appears to have, his home dose of 50 units lantus would be reasonaable to use. Pt will need the whole dose of lantus at least 2 hrs prior to the discontnuation of  the IV insulin drip. Correction (SSI): Would use the resistant correction tidwc-starting at the same moment the drip is d/c'd. Insulin - Meal Coverage: Will most probably need meal coverage of 6 units tidwc. Oral Agents: Would not use while here  I am familiar with this patient from history of hospital admissions many years ago. At that time, pt was only on insulin.  Cannot fathom that his body would respond to Glipizide.  Thank you, Rosita Kea, RN, CNS, Diabetes Coordinator [phone])

## 2014-02-04 NOTE — ED Notes (Signed)
CBG >600 °

## 2014-02-05 DIAGNOSIS — E1165 Type 2 diabetes mellitus with hyperglycemia: Secondary | ICD-10-CM | POA: Diagnosis not present

## 2014-02-05 DIAGNOSIS — B20 Human immunodeficiency virus [HIV] disease: Secondary | ICD-10-CM

## 2014-02-05 DIAGNOSIS — W19XXXD Unspecified fall, subsequent encounter: Secondary | ICD-10-CM

## 2014-02-05 LAB — BASIC METABOLIC PANEL
Anion gap: 15 (ref 5–15)
BUN: 10 mg/dL (ref 6–23)
CO2: 21 mEq/L (ref 19–32)
Calcium: 9.1 mg/dL (ref 8.4–10.5)
Chloride: 97 mEq/L (ref 96–112)
Creatinine, Ser: 0.67 mg/dL (ref 0.50–1.35)
GFR calc Af Amer: >90 mL/min (ref >90)
GFR calc non Af Amer: >90 mL/min (ref >90)
Glucose, Bld: 298 mg/dL — ABNORMAL HIGH (ref 70–99)
Potassium: 3.8 mEq/L (ref 3.7–5.3)
Sodium: 133 mEq/L — ABNORMAL LOW (ref 137–147)

## 2014-02-05 LAB — GLUCOSE, CAPILLARY
Glucose-Capillary: 304 mg/dL — ABNORMAL HIGH (ref 70–99)
Glucose-Capillary: 281 mg/dL — ABNORMAL HIGH (ref 70–99)
Glucose-Capillary: 302 mg/dL — ABNORMAL HIGH (ref 70–99)

## 2014-02-05 LAB — CBC
HCT: 37.2 % — ABNORMAL LOW (ref 39.0–52.0)
Hemoglobin: 12.9 g/dL — ABNORMAL LOW (ref 13.0–17.0)
MCH: 27.3 pg (ref 26.0–34.0)
MCHC: 34.7 g/dL (ref 30.0–36.0)
MCV: 78.6 fL (ref 78.0–100.0)
Platelets: 219 10*3/uL (ref 150–400)
RBC: 4.73 MIL/uL (ref 4.22–5.81)
RDW: 12.9 % (ref 11.5–15.5)
WBC: 6.4 10*3/uL (ref 4.0–10.5)

## 2014-02-05 MED ORDER — OXYCODONE HCL 5 MG PO TABS
5.0000 mg | ORAL_TABLET | ORAL | Status: DC | PRN
  Administered 2014-02-05: 5 mg via ORAL
  Filled 2014-02-05: qty 1

## 2014-02-05 MED ORDER — OXYCODONE HCL 5 MG PO TABS: ORAL_TABLET | ORAL | Status: DC

## 2014-02-05 MED ORDER — GLIPIZIDE ER 5 MG PO TB24: 5.0000 mg | ORAL_TABLET | Freq: Every day | ORAL | Status: DC

## 2014-02-05 NOTE — Discharge Summary (Signed)
Name: Keith [name] MRN: [name] DOB: 1965-04-27 48 y.o. PCP: Keith Gallant, MD  Date of Admission: 02/04/2014  1:41 AM Date of Discharge: 02/05/14 Attending Physician: Dr. Georjean Mode  Discharge Diagnosis: 1. Hyperglycemia 2. Recent Fall 3. HIV   Discharge Medications:   Medication List    STOP taking these medications        lisinopril 40 MG tablet  Commonly known as:  PRINIVIL,ZESTRIL      TAKE these medications        ANDROGEL 50 MG/5GM (1%) Gel  Generic drug:  testosterone  Place 5 g onto the skin daily.     aspirin 81 MG EC tablet  Take 81 mg by mouth daily.     dapsone 100 MG tablet  Take 100 mg by mouth daily.     Darunavir Ethanolate 800 MG tablet  Commonly known as:  PREZISTA  Take 800 mg by mouth at bedtime.     emtricitabine-tenofovir 200-300 MG per tablet  Commonly known as:  TRUVADA  Take 1 tablet by mouth at bedtime.     glipiZIDE 5 MG 24 hr tablet  Commonly known as:  GLUCOTROL XL  Take 1 tablet (5 mg total) by mouth daily with breakfast.     Insulin Glargine 100 UNIT/ML Solostar Pen  Commonly known as:  LANTUS SOLOSTAR  Inject 50 Units into the skin daily at 10 pm.     Insulin Pen Needle 31G X 5 MM Misc  1 application by Does not apply route once. diag code 250.02. Insulin dependent. Used for insulin inj 1 time daily     metFORMIN 500 MG tablet  Commonly known as:  GLUCOPHAGE  Take 1 tablet (500 mg total) by mouth 2 (two) times daily with a meal.     oxyCODONE 5 MG immediate release tablet  Commonly known as:  ROXICODONE  Take 1-2 tablets every 4-6 hours as needed for pain control     pravastatin 40 MG tablet  Commonly known as:  PRAVACHOL  Take 1 tablet (40 mg total) by mouth daily.     raltegravir 400 MG tablet  Commonly known as:  ISENTRESS  Take 400 mg by mouth 2 (two) times daily.     ritonavir 100 MG Tabs tablet  Commonly known as:  NORVIR  Take 100 mg by mouth at bedtime.        Disposition and follow-up:     Mr.Keith [name] was discharged from [facility] in Good condition.  At the hospital follow up visit please address:  1.  Follow up on Diabetes regimen; discharged on Lantus 50 units, Metformin 500 mg bid, and Glipizide 5 mg qd. Has patient been checking his CBG's at home? How frequently? Does patient have RC[facility] follow up?  2.  Labs / imaging needed at time of follow-up: BMP, HbA1c (not drawn during hospitalization)  3.  Pending labs/ test needing follow-up: none  Follow-up Appointments: Follow-up Information    Follow up with Keith [name]. Schedule an appointment as soon as possible for a visit in 1 week.   Contact information:   [address] [address] [id number]      Discharge Instructions: Discharge Instructions    Diet - low sodium heart healthy    Complete by:  As directed      Increase activity slowly    Complete by:  As directed            Consultations:  None  Procedures Performed:   Dg Chest 2 View  01/23/2014   CLINICAL DATA:  Hyperglycemia. Shortness of breath and cough for 1 week. Initial encounter  EXAM: CHEST  2 VIEW  COMPARISON:  11/09/2013  FINDINGS: Normal heart size. Chronic mild aortic tortuosity. Negative hila. There is no edema, consolidation, effusion, or pneumothorax.  IMPRESSION: No active cardiopulmonary disease.   Electronically Signed   By: Jorje Guild M.D.   On: 01/23/2014 02:33   Dg Knee Complete 4 Views Right  01/23/2014   CLINICAL DATA:  48 year old male with pain and swelling right knee. History of diabetes. No history of trauma provided. Initial encounter.  EXAM: RIGHT KNEE - COMPLETE 4+ VIEW  COMPARISON:  09/03/2012.  FINDINGS: No fracture or dislocation.  Minimal patellofemoral joint degenerative changes.  No plain film evidence of joint effusion.  IMPRESSION: No fracture or dislocation.  Minimal patellofemoral joint degenerative changes.  No plain film evidence of joint effusion.    Electronically Signed   By: Chauncey Cruel M.D.   On: 01/23/2014 02:33    Admission HPI: [name] is a 48 year old with HIV, DM2, HTN presenting with hyperglycemia. His blood sugars have been increasing since Monday. He reports they have been in the 700s. He reports compliance of his diabetes medications but states that he takes "Novolog" 50u daily and another medication twice a day. He last took insulin yesterday morning. He reports he eats about one solid meal a day - his last meal was fried fish, potatoes and hush puppies. He reports polyuria and polydipsia.  He reports exacerbation of his chronic right knee pain after falling today. His knee gave out on him. Denies head trauma or LOC. Denies fevers, chills, vision changes, cough, shortness of breath, chest pain, nausea, vomiting, abdominal pain, diarrhea, dysuria, heamturia, rash, bleeding/bruising, myalgias, weakness.  He was last seen in the ED 01/23/2014 for hyperglycemia, exacerbation of chronic right knee pain and productive cough. He had no anion gap, no ketones in his urine. His blood sugar decreased from 471 to to 327 after 5u of novolog and 2L NS. He's had multiple other visits to the ED for the same problem.  Hospital Course by problem list:   1. Hyperglycemia: Last A1c 5.4 on 05/04/2013. CBG >600, 746 on BMP on admission. CBG unchanged after 10u Novolog and 2L IVF in ED. AG 16, no ketones in urine initially. Lactic acid 0.99. Glucomander started in ED patient admitted to stepdown. Patient's AG and glucose improved after 24 hours, started on Lantus 40 units, Novolog 6 units tid, and ISS. CBG's remained slightly elevated to 200-300 range. On discharge, continued Metformin 1000 mg qd and Glipizide 5 mg qd.    2. Recent Fall: Per pt, his R knee gave way and he fell onto his knee yesterday. He denies hitting his head. His knee is tender on exam, but there is no edema, effusion, deformities, or warmth to the knee, and he has full ROM. He states  that he takes oxycodone IR at home when he can get a prescription, and that he has an appt today with his doctor to have his pain medication refilled. He was in the ED on 10/18 c/o hyperglycemia and knee pain, and was discharged home with an rx for Oxy IR at that time. He seemed a bit drowsy on exam except when discussing his pain and oxycodone. Will hold off on narcotics at this time given his drowsiness, and will provide ibuprofen for pain control.  -Ibuprofen prn pain  3. HIV: Followed by Dr. [name]. Last seen in Feb, CD4 120, VL<20 on 06/02/2013 and supposed to f/u in 64mo. On HAART: Prestiza, Truvada, Isentress, Norvir, and Dapsone for PCP prophylaxis due to Sulfa allergy. Repeat CD4 cells 170. Continued on HAART therapy and Dapsone.  Discharge Vitals:   BP 124/81 mmHg  Pulse 74  Temp(Src) 97.8 F (36.6 C) (Oral)  Resp 21  Ht 5\' 9"  (1.753 m)  Wt 251 lb 9.6 oz (114.125 kg)  BMI 37.14 kg/m2  SpO2 100%  Discharge Labs:  No results found for this or any previous visit (from the past 24 hour(s)).  Signed: Corky Sox, MD 02/08/2014, 3:16 PM    Services Ordered on Discharge: none Equipment Ordered on Discharge: none

## 2014-02-05 NOTE — Discharge Instructions (Signed)
1. Please schedule a follow up appointment within the next week. The [facility] clinic will call you with an appointment.   2. Please take all medications as prescribed.   Take Lantus 50 units EVERY NIGHT.  Take Glipizide 5 mg every morning with breakfast. DO NOT TAKE if blood sugar is <130.   Continue Metformin.   Stop taking Lisinopril for now. A new BP medication will be resumed at your next clinic visit.   3. If you have worsening of your symptoms or new symptoms arise, please call the clinic [phone]), or go to the ER immediately if symptoms are severe.   Hyperglycemia Hyperglycemia occurs when the glucose (sugar) in your blood is too high. Hyperglycemia can happen for many reasons, but it most often happens to people who do not know they have diabetes or are not managing their diabetes properly.  CAUSES  Whether you have diabetes or not, there are other causes of hyperglycemia. Hyperglycemia can occur when you have diabetes, but it can also occur in other situations that you might not be as aware of, such as: Diabetes  If you have diabetes and are having problems controlling your blood glucose, hyperglycemia could occur because of some of the following reasons:  Not following your meal plan.  Not taking your diabetes medications or not taking it properly.  Exercising less or doing less activity than you normally do.  Being sick. Pre-diabetes  This cannot be ignored. Before people develop Type 2 diabetes, they almost always have "pre-diabetes." This is when your blood glucose levels are higher than normal, but not yet high enough to be diagnosed as diabetes. Research has shown that some long-term damage to the body, especially the heart and circulatory system, may already be occurring during pre-diabetes. If you take action to manage your blood glucose when you have pre-diabetes, you may delay or prevent Type 2 diabetes from developing. Stress  If you have diabetes, you may be  "diet" controlled or on oral medications or insulin to control your diabetes. However, you may find that your blood glucose is higher than usual in the hospital whether you have diabetes or not. This is often referred to as "stress hyperglycemia." Stress can elevate your blood glucose. This happens because of hormones put out by the body during times of stress. If stress has been the cause of your high blood glucose, it can be followed regularly by your caregiver. That way he/she can make sure your hyperglycemia does not continue to get worse or progress to diabetes. Steroids  Steroids are medications that act on the infection fighting system (immune system) to block inflammation or infection. One side effect can be a rise in blood glucose. Most people can produce enough extra insulin to allow for this rise, but for those who cannot, steroids make blood glucose levels go even higher. It is not unusual for steroid treatments to "uncover" diabetes that is developing. It is not always possible to determine if the hyperglycemia will go away after the steroids are stopped. A special blood test called an A1c is sometimes done to determine if your blood glucose was elevated before the steroids were started. SYMPTOMS  Thirsty.  Frequent urination.  Dry mouth.  Blurred vision.  Tired or fatigue.  Weakness.  Sleepy.  Tingling in feet or leg. DIAGNOSIS  Diagnosis is made by monitoring blood glucose in one or all of the following ways:  A1c test. This is a chemical found in your blood.  Fingerstick blood glucose  monitoring.  Laboratory results. TREATMENT  First, knowing the cause of the hyperglycemia is important before the hyperglycemia can be treated. Treatment may include, but is not be limited to:  Education.  Change or adjustment in medications.  Change or adjustment in meal plan.  Treatment for an illness, infection, etc.  More frequent blood glucose monitoring.  Change in  exercise plan.  Decreasing or stopping steroids.  Lifestyle changes. HOME CARE INSTRUCTIONS   Test your blood glucose as directed.  Exercise regularly. Your caregiver will give you instructions about exercise. Pre-diabetes or diabetes which comes on with stress is helped by exercising.  Eat wholesome, balanced meals. Eat often and at regular, fixed times. Your caregiver or nutritionist will give you a meal plan to guide your sugar intake.  Being at an ideal weight is important. If needed, losing as little as 10 to 15 pounds may help improve blood glucose levels. SEEK MEDICAL CARE IF:   You have questions about medicine, activity, or diet.  You continue to have symptoms (problems such as increased thirst, urination, or weight gain). SEEK IMMEDIATE MEDICAL CARE IF:   You are vomiting or have diarrhea.  Your breath smells fruity.  You are breathing faster or slower.  You are very sleepy or incoherent.  You have numbness, tingling, or pain in your feet or hands.  You have chest pain.  Your symptoms get worse even though you have been following your caregiver's orders.  If you have any other questions or concerns. Document Released: 09/18/2000 Document Revised: 06/17/2011 Document Reviewed: 07/22/2011 [facility] Patient Information 2015 [place], [place]. This information is not intended to replace advice given to you by your health care provider. Make sure you discuss any questions you have with your health care provider.

## 2014-02-05 NOTE — Progress Notes (Signed)
02/05/2014 10:55 AM  Pt refusing bed alarm currently.  States he wants to get up and walk the halls and shave.  Pt appears steady on his feet, does have recent history of fall a day before admit.  I told patient I would be glad to assist him as soon as I have a moment to do so, and he insists on doing his ADLs independently and refuses the bed alarm as well, despite education.  Will monitor closely. Princella Pellegrini

## 2014-02-05 NOTE — Progress Notes (Signed)
Subjective: Patient seen at bedside, no significant complaints today. Had some mild nausea this AM, but no abdominal pain.   Objective: Vital signs in last 24 hours: Filed Vitals:   02/04/14 0945 02/04/14 1900 02/05/14 0500 02/05/14 1029  BP: 125/79 110/73 127/86 124/81  Pulse: 61 54 64 74  Temp: 98 F (36.7 C) 98.2 F (36.8 C) 97.7 F (36.5 C) 97.8 F (36.6 C)  TempSrc:  Oral Oral Oral  Resp: 18 18 18 21   Height:      Weight:   251 lb 9.6 oz (114.125 kg)   SpO2: 100% 100% 98% 100%   Weight change:   Intake/Output Summary (Last 24 hours) at 02/05/14 1150 Last data filed at 02/05/14 0906  Gross per 24 hour  Intake 2384.17 ml  Output    570 ml  Net 1814.17 ml   Physical Exam General: Alert, sitting up in bed, NAD HEENT: PERRL, EOMI, mucus membranes moist CV: RRR, normal S1/S2, no m/g/r Pulm: CTA bilaterally, breaths non-labored Abd: BS+, soft, non-tender  Ext: Warm, no edema Neuro: Alert and oriented x 3, CNs II-XII intact  Lab Results: Basic Metabolic Panel:  Recent Labs Lab 02/04/14 2236 02/05/14 0055  NA 135* 133*  K 3.5* 3.8  CL 99 97  CO2 24 21  GLUCOSE 204* 298*  BUN 10 10  CREATININE 0.64 0.67  CALCIUM 9.1 9.1   Liver Function Tests:  Recent Labs Lab 02/04/14 0159  AST 18  ALT 14  ALKPHOS 128*  BILITOT 0.3  PROT 8.2  ALBUMIN 3.8   CBC:  Recent Labs Lab 02/04/14 0807 02/05/14 0055  WBC 5.0 6.4  HGB 12.8* 12.9*  HCT 37.4* 37.2*  MCV 78.4 78.6  PLT 240 219   CBG:  Recent Labs Lab 02/04/14 1603 02/04/14 1705 02/04/14 1814 02/04/14 1920 02/04/14 2025 02/05/14 0802  GLUCAP 156* 138* 145* 140* 168* 304*   Urine Drug Screen: Drugs of Abuse     Component Value Date/Time   LABOPIA NONE DETECTED 02/04/2014 0630   LABOPIA NEG 11/22/2011 1402   COCAINSCRNUR NONE DETECTED 02/04/2014 0630   COCAINSCRNUR NEG 11/22/2011 1402   LABBENZ NONE DETECTED 02/04/2014 0630   LABBENZ NEG 11/22/2011 1402   LABBENZ NEG 08/07/2011 1217   AMPHETMU NONE DETECTED 02/04/2014 0630   AMPHETMU NEG 08/07/2011 1217   THCU NONE DETECTED 02/04/2014 0630   LABBARB NONE DETECTED 02/04/2014 0630   LABBARB NEG 11/22/2011 1402    Alcohol Level:  Recent Labs Lab 02/04/14 0807  ETH <11   Urinalysis:  Recent Labs Lab 02/04/14 0200  COLORURINE YELLOW  LABSPEC 1.033*  PHURINE 5.5  GLUCOSEU >1000*  HGBUR NEGATIVE  BILIRUBINUR NEGATIVE  KETONESUR NEGATIVE  PROTEINUR NEGATIVE  UROBILINOGEN 0.2  NITRITE NEGATIVE  LEUKOCYTESUR NEGATIVE    Micro Results: Recent Results (from the past 240 hour(s))  MRSA PCR SCREENING     Status: Abnormal   Collection Time    02/04/14  3:29 PM      Result Value Ref Range Status   MRSA by PCR POSITIVE (*) NEGATIVE Final   Comment:            The GeneXpert MRSA Assay (FDA     approved for NASAL specimens     only), is one component of a     comprehensive MRSA colonization     surveillance program. It is not     intended to diagnose MRSA     infection nor to guide or  monitor treatment for     MRSA infections.     RESULT CALLED TO, READ BACK BY AND VERIFIED WITH:     E. STREET RN 17:25 02/04/14 ([email or web address])    Medications: I have reviewed the patient's current medications. Scheduled Meds: . aspirin EC  81 mg Oral Daily  . capsaicin   Topical BID  . dapsone  100 mg Oral Daily  . Darunavir Ethanolate  800 mg Oral QHS  . emtricitabine-tenofovir  1 tablet Oral QHS  . heparin  5,000 Units Subcutaneous 3 times per day  . insulin aspart  0-15 Units Subcutaneous TID WC  . insulin aspart  6 Units Subcutaneous TID WC  . insulin glargine  40 Units Subcutaneous QHS  . pravastatin  40 mg Oral q1800  . raltegravir  400 mg Oral BID  . ritonavir  100 mg Oral QHS  . testosterone  5 g Transdermal Daily   Continuous Infusions: . sodium chloride Stopped (02/05/14 0901)   PRN Meds:.acetaminophen, dextrose, oxyCODONE   Assessment/Plan: Keith [name] is a 48yo man with HIV, Type 2 DM, and HTN who  presented with hyperglycemia likely due to medication noncompliance.  Hyperglycemia/Likely HHS: Blood sugars elevated this AM. Transitioned to Lantus 40 units qhs + Novolog 6 units tid + ISS yesterday. Tolerating po.  -Continue Lantus 40 units for now -D/c IVF -Novolog 6 units tid + ISS -Will discharge patient on home Lantus, Metformin 1000 mg qd, and glipizide 5 mg qd.   S/p Fall: Right knee x-ray on 10/18 negative for fracture.  -Capsaicin cream -Oxycodone 5 mg q4h  HIV: Followed by Dr. Johnnye [name]. Last seen in Feb, CD4 120, VL<20 on 06/02/2013 and supposed to f/u in 23mo. On HAART: Prestiza, Truvada, Isentress, Norvir, and Dapsone for PCP prophylaxis due to Sulfa allergy. Repeat CD4 cells 170.  -Continue HAART therapy -Continue Dapsone  -VL pending -Pt will need ID f/u appointment  HLD: Lipid panel 06/02/13 w/ total cholesterol 268, TG 196. LDL 186. On Pravastatin 40mg  daily at home.  -Continue Pravastatin 40mg  daily  ED: Likely related to uncontrolled diabetes. Last serum testosterone in '10 was 238.95. Normal range 350-890. On testosterone topical at home. Repeat testosterone 274.  -Continue testosterone gel daily  HTN: BP stable.  -Follow up as outpatient  Diet: Carb modified.   DVT/PE PPx: Heparin SQ  Dispo: Discharge today.   The patient does have a current PCP Keith Gallant, MD) and does need an [facility] hospital follow-up appointment after discharge.  The patient does not have transportation limitations that hinder transportation to clinic appointments.  .Services Needed at time of discharge: Y = Yes, Blank = No PT:   OT:   RN:   Equipment:   Other:     LOS: 1 day   Corky Sox, MD 02/05/2014, 11:50 AM

## 2014-02-08 ENCOUNTER — Other Ambulatory Visit: Payer: Medicare Other

## 2014-02-08 ENCOUNTER — Telehealth: Payer: Self-pay | Admitting: Internal Medicine

## 2014-02-08 DIAGNOSIS — B20 Human immunodeficiency virus [HIV] disease: Secondary | ICD-10-CM

## 2014-02-08 LAB — COMPREHENSIVE METABOLIC PANEL
ALT: 13 U/L (ref 0–53)
AST: 15 U/L (ref 0–37)
Albumin: 4.4 g/dL (ref 3.5–5.2)
Alkaline Phosphatase: 84 U/L (ref 39–117)
BUN: 11 mg/dL (ref 6–23)
CO2: 25 mEq/L (ref 19–32)
Calcium: 9.7 mg/dL (ref 8.4–10.5)
Chloride: 94 mEq/L — ABNORMAL LOW (ref 96–112)
Creat: 1.06 mg/dL (ref 0.50–1.35)
Glucose, Bld: 557 mg/dL (ref 70–99)
Potassium: 4.6 mEq/L (ref 3.5–5.3)
Sodium: 131 mEq/L — ABNORMAL LOW (ref 135–145)
Total Bilirubin: 0.5 mg/dL (ref 0.2–1.2)
Total Protein: 7.8 g/dL (ref 6.0–8.3)

## 2014-02-08 LAB — CBC WITH DIFFERENTIAL/PLATELET
Basophils Absolute: 0.1 10*3/uL (ref 0.0–0.1)
Basophils Relative: 1 % (ref 0–1)
Eosinophils Absolute: 0.1 10*3/uL (ref 0.0–0.7)
Eosinophils Relative: 2 % (ref 0–5)
HCT: 41.2 % (ref 39.0–52.0)
Hemoglobin: 13.2 g/dL (ref 13.0–17.0)
Lymphocytes Relative: 26 % (ref 12–46)
Lymphs Abs: 1.6 10*3/uL (ref 0.7–4.0)
MCH: 26.6 pg (ref 26.0–34.0)
MCHC: 32 g/dL (ref 30.0–36.0)
MCV: 82.9 fL (ref 78.0–100.0)
Monocytes Absolute: 0.3 10*3/uL (ref 0.1–1.0)
Monocytes Relative: 5 % (ref 3–12)
Neutro Abs: 4.1 10*3/uL (ref 1.7–7.7)
Neutrophils Relative %: 66 % (ref 43–77)
Platelets: 258 10*3/uL (ref 150–400)
RBC: 4.97 MIL/uL (ref 4.22–5.81)
RDW: 14 % (ref 11.5–15.5)
WBC: 6.2 10*3/uL (ref 4.0–10.5)

## 2014-02-08 NOTE — Telephone Encounter (Signed)
I received an after hours page from [place] me of a critically high serum glucose for [name].  The labs were drawn this morning around 11:30AM and revealed a serum glucose of 557.  The AG is 12 and bicarb is 25.  I attempted to call the patient to determine if his CBG was elevated and/or if he was having hyperglycemic symptoms and needed further evaluation.  I tried calling his phone number several times but was unable to leave a message or reach him directly.  His emergency contact's phone number was not in service.  I will forward a message to his PCP so she can attempt contact tomorrow.   Duwaine Maxin, DO IMTS, PGY2

## 2014-02-09 LAB — HEMOGLOBIN A1C
Hgb A1c MFr Bld: 14.9 %
Mean Plasma Glucose: 381 mg/dL

## 2014-02-10 LAB — T-HELPER CELL (CD4) - (RCID CLINIC ONLY)
CD4 % Helper T Cell: 9 % — ABNORMAL LOW (ref 33–55)
CD4 T Cell Abs: 150 /uL — ABNORMAL LOW (ref 400–2700)

## 2014-02-11 LAB — HIV-1 RNA QUANT-NO REFLEX-BLD
HIV 1 RNA Quant: 45 {copies}/mL — ABNORMAL HIGH
HIV-1 RNA Quant, Log: 1.65 {Log} — ABNORMAL HIGH

## 2014-02-12 ENCOUNTER — Other Ambulatory Visit: Payer: Self-pay | Admitting: Infectious Diseases

## 2014-02-14 ENCOUNTER — Ambulatory Visit: Payer: Medicare Other | Admitting: Internal Medicine

## 2014-02-14 ENCOUNTER — Encounter (HOSPITAL_COMMUNITY): Payer: Self-pay | Admitting: Emergency Medicine

## 2014-02-14 ENCOUNTER — Observation Stay (HOSPITAL_COMMUNITY)
Admission: EM | Admit: 2014-02-14 | Discharge: 2014-02-17 | Disposition: A | Discharge status: Home / Self Care | Payer: Medicare Other | Attending: Oncology | Admitting: Oncology

## 2014-02-14 DIAGNOSIS — G8929 Other chronic pain: Secondary | ICD-10-CM | POA: Diagnosis not present

## 2014-02-14 DIAGNOSIS — I10 Essential (primary) hypertension: Secondary | ICD-10-CM | POA: Diagnosis not present

## 2014-02-14 DIAGNOSIS — Z79899 Other long term (current) drug therapy: Secondary | ICD-10-CM | POA: Insufficient documentation

## 2014-02-14 DIAGNOSIS — Z7982 Long term (current) use of aspirin: Secondary | ICD-10-CM | POA: Insufficient documentation

## 2014-02-14 DIAGNOSIS — Z794 Long term (current) use of insulin: Secondary | ICD-10-CM | POA: Diagnosis not present

## 2014-02-14 DIAGNOSIS — N521 Erectile dysfunction due to diseases classified elsewhere: Secondary | ICD-10-CM

## 2014-02-14 DIAGNOSIS — R739 Hyperglycemia, unspecified: Secondary | ICD-10-CM | POA: Diagnosis present

## 2014-02-14 DIAGNOSIS — E1165 Type 2 diabetes mellitus with hyperglycemia: Secondary | ICD-10-CM | POA: Diagnosis not present

## 2014-02-14 DIAGNOSIS — E1159 Type 2 diabetes mellitus with other circulatory complications: Secondary | ICD-10-CM

## 2014-02-14 DIAGNOSIS — N529 Male erectile dysfunction, unspecified: Secondary | ICD-10-CM | POA: Insufficient documentation

## 2014-02-14 DIAGNOSIS — B20 Human immunodeficiency virus [HIV] disease: Secondary | ICD-10-CM | POA: Diagnosis not present

## 2014-02-14 DIAGNOSIS — Z882 Allergy status to sulfonamides status: Secondary | ICD-10-CM | POA: Insufficient documentation

## 2014-02-14 DIAGNOSIS — M25561 Pain in right knee: Secondary | ICD-10-CM | POA: Diagnosis not present

## 2014-02-14 LAB — COMPREHENSIVE METABOLIC PANEL
ALT: 16 U/L (ref 0–53)
AST: 22 U/L (ref 0–37)
Albumin: 4 g/dL (ref 3.5–5.2)
Alkaline Phosphatase: 123 U/L — ABNORMAL HIGH (ref 39–117)
Anion gap: 17 — ABNORMAL HIGH (ref 5–15)
BUN: 17 mg/dL (ref 6–23)
CO2: 24 mEq/L (ref 19–32)
Calcium: 9.9 mg/dL (ref 8.4–10.5)
Chloride: 87 mEq/L — ABNORMAL LOW (ref 96–112)
Creatinine, Ser: 0.96 mg/dL (ref 0.50–1.35)
GFR calc Af Amer: >90 mL/min (ref >90)
GFR calc non Af Amer: >90 mL/min (ref >90)
Glucose, Bld: 733 mg/dL (ref 70–99)
Potassium: 5.3 mEq/L (ref 3.7–5.3)
Sodium: 128 mEq/L — ABNORMAL LOW (ref 137–147)
Total Bilirubin: 0.3 mg/dL (ref 0.3–1.2)
Total Protein: 8.3 g/dL (ref 6.0–8.3)

## 2014-02-14 LAB — BASIC METABOLIC PANEL
Anion gap: 16 — ABNORMAL HIGH (ref 5–15)
BUN: 14 mg/dL (ref 6–23)
CO2: 22 mEq/L (ref 19–32)
Calcium: 9.2 mg/dL (ref 8.4–10.5)
Chloride: 96 mEq/L (ref 96–112)
Creatinine, Ser: 0.73 mg/dL (ref 0.50–1.35)
GFR calc Af Amer: >90 mL/min (ref >90)
GFR calc non Af Amer: >90 mL/min (ref >90)
Glucose, Bld: 350 mg/dL — ABNORMAL HIGH (ref 70–99)
Potassium: 3.8 mEq/L (ref 3.7–5.3)
Sodium: 134 mEq/L — ABNORMAL LOW (ref 137–147)

## 2014-02-14 LAB — URINALYSIS, ROUTINE W REFLEX MICROSCOPIC
Bilirubin Urine: NEGATIVE
Glucose, UA: >1000 mg/dL — AB
Hgb urine dipstick: NEGATIVE
Ketones, ur: NEGATIVE mg/dL
Leukocytes, UA: NEGATIVE
Nitrite: NEGATIVE
Protein, ur: NEGATIVE mg/dL
Specific Gravity, Urine: 1.033 — ABNORMAL HIGH (ref 1.005–1.030)
Urobilinogen, UA: 0.2 mg/dL (ref 0.0–1.0)
pH: 6 (ref 5.0–8.0)

## 2014-02-14 LAB — CBC WITH DIFFERENTIAL/PLATELET
Basophils Absolute: 0 10*3/uL (ref 0.0–0.1)
Basophils Relative: 0 % (ref 0–1)
Eosinophils Absolute: 0.1 10*3/uL (ref 0.0–0.7)
Eosinophils Relative: 1 % (ref 0–5)
HCT: 41.5 % (ref 39.0–52.0)
Hemoglobin: 13.9 g/dL (ref 13.0–17.0)
Lymphocytes Relative: 24 % (ref 12–46)
Lymphs Abs: 1.7 10*3/uL (ref 0.7–4.0)
MCH: 27.1 pg (ref 26.0–34.0)
MCHC: 33.5 g/dL (ref 30.0–36.0)
MCV: 80.9 fL (ref 78.0–100.0)
Monocytes Absolute: 0.3 10*3/uL (ref 0.1–1.0)
Monocytes Relative: 4 % (ref 3–12)
Neutro Abs: 5.1 10*3/uL (ref 1.7–7.7)
Neutrophils Relative %: 71 % (ref 43–77)
Platelets: 232 10*3/uL (ref 150–400)
RBC: 5.13 MIL/uL (ref 4.22–5.81)
RDW: 12.9 % (ref 11.5–15.5)
WBC: 7.2 10*3/uL (ref 4.0–10.5)

## 2014-02-14 LAB — CBG MONITORING, ED
Glucose-Capillary: >600 mg/dL (ref 70–99)
Glucose-Capillary: 580 mg/dL (ref 70–99)
Glucose-Capillary: 443 mg/dL — ABNORMAL HIGH (ref 70–99)

## 2014-02-14 LAB — I-STAT VENOUS BLOOD GAS, ED
Acid-Base Excess: 1 mmol/L (ref 0.0–2.0)
Bicarbonate: 26.9 mEq/L — ABNORMAL HIGH (ref 20.0–24.0)
O2 Saturation: 89 %
TCO2: 28 mmol/L (ref 0–100)
pCO2, Ven: 46.5 mmHg (ref 45.0–50.0)
pH, Ven: 7.37 — ABNORMAL HIGH (ref 7.250–7.300)
pO2, Ven: 60 mmHg — ABNORMAL HIGH (ref 30.0–45.0)

## 2014-02-14 LAB — LACTIC ACID, PLASMA: Lactic Acid, Venous: 1.7 mmol/L (ref 0.5–2.2)

## 2014-02-14 LAB — GLUCOSE, CAPILLARY: Glucose-Capillary: 218 mg/dL — ABNORMAL HIGH (ref 70–99)

## 2014-02-14 LAB — URINE MICROSCOPIC-ADD ON

## 2014-02-14 MED ORDER — ACETAMINOPHEN 325 MG PO TABS
650.0000 mg | ORAL_TABLET | Freq: Four times a day (QID) | ORAL | Status: DC | PRN
  Filled 2014-02-14: qty 2

## 2014-02-14 MED ORDER — INSULIN GLARGINE 100 UNIT/ML ~~LOC~~ SOLN
25.0000 [IU] | Freq: Every day | SUBCUTANEOUS | Status: DC
  Administered 2014-02-14: 25 [IU] via SUBCUTANEOUS
  Filled 2014-02-14 (×2): qty 0.25

## 2014-02-14 MED ORDER — SODIUM CHLORIDE 0.9 % IV SOLN
INTRAVENOUS | Status: DC
  Administered 2014-02-15: 10:00:00 via INTRAVENOUS
  Administered 2014-02-15: 1000 mL via INTRAVENOUS

## 2014-02-14 MED ORDER — IBUPROFEN 600 MG PO TABS
600.0000 mg | ORAL_TABLET | Freq: Four times a day (QID) | ORAL | Status: DC | PRN
  Administered 2014-02-15 – 2014-02-16 (×2): 600 mg via ORAL
  Filled 2014-02-14 (×3): qty 1

## 2014-02-14 MED ORDER — INSULIN ASPART 100 UNIT/ML ~~LOC~~ SOLN
10.0000 [IU] | Freq: Once | SUBCUTANEOUS | Status: AC
  Administered 2014-02-14: 10 [IU] via SUBCUTANEOUS

## 2014-02-14 MED ORDER — INSULIN ASPART 100 UNIT/ML ~~LOC~~ SOLN
20.0000 [IU] | Freq: Once | SUBCUTANEOUS | Status: AC
  Administered 2014-02-14: 20 [IU] via SUBCUTANEOUS
  Filled 2014-02-14: qty 1

## 2014-02-14 MED ORDER — SODIUM CHLORIDE 0.9 % IV BOLUS (SEPSIS)
1000.0000 mL | Freq: Once | INTRAVENOUS | Status: AC
  Administered 2014-02-14: 1000 mL via INTRAVENOUS

## 2014-02-14 MED ORDER — CAPSAICIN 0.075 % EX CREA
TOPICAL_CREAM | Freq: Two times a day (BID) | CUTANEOUS | Status: DC | PRN
  Filled 2014-02-14 (×2): qty 60

## 2014-02-14 MED ORDER — ENOXAPARIN SODIUM 60 MG/0.6ML ~~LOC~~ SOLN
55.0000 mg | SUBCUTANEOUS | Status: DC
  Administered 2014-02-14: 55 mg via SUBCUTANEOUS
  Filled 2014-02-14 (×2): qty 0.6

## 2014-02-14 MED ORDER — INSULIN ASPART 100 UNIT/ML ~~LOC~~ SOLN
0.0000 [IU] | SUBCUTANEOUS | Status: DC
  Administered 2014-02-14: 3 [IU] via SUBCUTANEOUS
  Administered 2014-02-15 (×2): 9 [IU] via SUBCUTANEOUS
  Administered 2014-02-15: 5 [IU] via SUBCUTANEOUS

## 2014-02-14 MED ORDER — ENOXAPARIN SODIUM 40 MG/0.4ML ~~LOC~~ SOLN: 40.0000 mg | SUBCUTANEOUS | Status: DC

## 2014-02-14 MED ORDER — INSULIN ASPART 100 UNIT/ML ~~LOC~~ SOLN
10.0000 [IU] | Freq: Once | SUBCUTANEOUS | Status: DC
  Filled 2014-02-14: qty 1

## 2014-02-14 MED ORDER — SODIUM CHLORIDE 0.9 % IV BOLUS (SEPSIS): 1000.0000 mL | Freq: Once | INTRAVENOUS | Status: DC

## 2014-02-14 MED ORDER — CAPSAICIN 0.075 % EX CREA
TOPICAL_CREAM | Freq: Two times a day (BID) | CUTANEOUS | Status: DC | PRN
  Filled 2014-02-14: qty 60

## 2014-02-14 NOTE — ED Notes (Signed)
Attempted report 

## 2014-02-14 NOTE — H&P (Signed)
Date: 02/14/2014               Patient Name:  Keith [name] MRN: [name]  DOB: 03/13/1966 Age / Sex: 48 y.o., male   PCP: Keith Gallant, MD         Medical Service: Internal Medicine Teaching Service         Attending Physician: Dr. Annia Belt, MD    First Contact: Dr. Randell Patient Pager: [phone]  Second Contact: Dr. Denton Brick Pager: [phone]       After Hours (After 5p/  First Contact Pager: [phone]  weekends / holidays): Second Contact Pager: [phone]   Chief Complaint: Hyperglycemia  History of Present Illness: Keith [name] is a 48 year old with HIV, DM2, HTN presenting with hyperglycemia. His blood sugars have been increasing since yesterday. He reports they were in the 200s prior and yesterday was 400. Last night and this morning the blood sugar was too high for the machine to read. He reports compliance of his diabetes medications but states that he takes 60u of insulin daily, metformin twice a day and glipizide once a day. He reports he eats about one solid meal a day - his last meal was fish yesterday for lunch. Denies fried foods or breading on the fish. He reports he drinks mostly Sprite Zero and occasionally some sweet tea. He reports blurry vision, polyuria and polydipsia.  He reports subjective fevers and chills, one episode of emesis yesterday and one episode of diarrhea this morning. No sick contacts or recent antibiotic use. Denies nausea presently. Denies cough, shortness of breath, chest pain, abdominal pain, dysuria, hematuria, rash, bleeding/bruising, myalgias, weakness.  He has had multiple ED visits for hyperglycemia and exacerbation of chronic right knee pain. He was last admitted 02/04/2014 - 02/05/2014. His glucose was >600, 746 on BMP with an anion gap of 16 and no ketones in his urine. He was started on an insulin drip and his anion gap and glucose improved. He was transitioned to lantus 40u, novolog 6u TID and SSI. On discharge, his home regimen was restarted.    Meds: No current facility-administered medications for this encounter.   Current Outpatient Prescriptions  Medication Sig Dispense Refill  . aspirin 81 MG EC tablet Take 81 mg by mouth daily.     . dapsone 100 MG tablet Take 100 mg by mouth daily.    . Darunavir Ethanolate (PREZISTA) 800 MG tablet Take 800 mg by mouth daily with breakfast.     . emtricitabine-tenofovir (TRUVADA) 200-300 MG per tablet Take 1 tablet by mouth daily.     Marland Kitchen glipiZIDE (GLUCOTROL XL) 5 MG 24 hr tablet Take 1 tablet (5 mg total) by mouth daily with breakfast. 30 tablet 2  . Insulin Glargine (LANTUS SOLOSTAR) 100 UNIT/ML Solostar Pen Inject 50 Units into the skin daily at 10 pm. (Patient taking differently: Inject 60 Units into the skin daily at 10 pm. ) 15 mL 11  . ISENTRESS 400 MG tablet TAKE 1 TABLET BY MOUTH TWICE DAILY 60 tablet 0  . metFORMIN (GLUCOPHAGE) 500 MG tablet Take 1 tablet (500 mg total) by mouth 2 (two) times daily with a meal. 60 tablet 11  . NORVIR 100 MG TABS tablet TAKE 1 TABLET BY MOUTH EVERY DAY WITH BREAKFAST 30 tablet 0  . Oxycodone HCl 10 MG TABS Take 10 mg by mouth 3 (three) times daily.    . pravastatin (PRAVACHOL) 40 MG tablet Take 1 tablet (40 mg total) by mouth daily. 30 tablet 11  .  raltegravir (ISENTRESS) 400 MG tablet Take 400 mg by mouth 2 (two) times daily.    . ritonavir (NORVIR) 100 MG TABS tablet Take 100 mg by mouth daily with breakfast.     . testosterone (ANDROGEL) 50 MG/5GM GEL Place 5 g onto the skin daily.     . dapsone 100 MG tablet TAKE 1 TABLET BY MOUTH EVERY DAY 30 tablet 0  . Insulin Pen Needle 31G X 5 MM MISC 1 application by Does not apply route once. diag code 250.02. Insulin dependent. Used for insulin inj 1 time daily 100 each 6  . oxyCODONE (ROXICODONE) 5 MG immediate release tablet Take 1-2 tablets every 4-6 hours as needed for pain control 20 tablet 0  . PREZISTA 800 MG tablet TAKE 1 TABLET BY MOUTH EVERY DAY WITH BREAKFAST 30 tablet 0  . TRUVADA 200-300 MG  per tablet TAKE 1 TABLET BY MOUTH EVERY DAY 30 tablet 0    Allergies: Allergies as of 02/14/2014 - Review Complete 02/14/2014  Allergen Reaction Noted  . Sulfa antibiotics Itching 03/26/2011   Past Medical History  Diagnosis Date  . HIV (human immunodeficiency virus infection) 2009    CD4 count 100, VL 13800 (05/01/2010)  . Diabetes type 2, uncontrolled     HgA1c 17.6 (04/27/2010)  . Hypertension   . Genital warts   . Erectile dysfunction   . Chronic knee pain     right  . Osteomyelitis     h/o hand  . Diabetes mellitus   . Chronic pain   . AIDS    Past Surgical History  Procedure Laterality Date  . Multiple extractions with alveoloplasty N/A 01/18/2013    Procedure: MULTIPLE EXTRACION 3, 6, 7, 10, 11, 13, 21, 22, 27, 28, 29, 30 WITH ALVEOLOPLASTY;  Surgeon: Gae Bon, DDS;  Location: [facility];  Service: Oral Surgery;  Laterality: N/A;  . Hernia repair     Family History  Problem Relation Age of Onset  . Hypertension Mother   . Arthritis Father   . Hypertension Father   . Hypertension Brother   . Cancer Maternal Grandmother 43    unknown type of cancer  . Depression Paternal Grandmother    History   Social History  . Marital Status: Single    Spouse Name: N/A    Number of Children: 38  . Years of Education: 12   Occupational History  . Not on file.   Social History Main Topics  . Smoking status: Never Smoker   . Smokeless tobacco: Never Used  . Alcohol Use: No  . Drug Use: No  . Sexual Activity:    Partners: Female     Comment: given condoms   Other Topics Concern  . Not on file   Social History Narrative   Worked for the city of [place] for 18 years.   Unemployed.    Applying for disability.   Medicaid patient.    Review of Systems: Constitutional: +subjective fevers/chills Eyes: +blurry vision Ears, nose, mouth, throat, and face: no cough Respiratory: no shortness of breath Cardiovascular: no chest pain Gastrointestinal:  +nausea/vomiting, no abdominal pain, no constipation, +diarrhea Genitourinary: no dysuria, no hematuria Integument: no rash Hematologic/lymphatic: no bleeding/bruising, no edema Musculoskeletal: +chronic knee pain, no myalgias Neurological: no paresthesias, no weakness   Physical Exam: Blood pressure 122/91, pulse 83, temperature 97.6 F (36.4 C), temperature source Oral, resp. rate 20, SpO2 97 %. General Apperance: NAD Head: Normocephalic, atraumatic Eyes: PERRL, EOMI, anicteric sclera Ears: Normal external ear  canal Nose: Nares normal, septum midline, mucosa normal Throat: Lips, mucosa and tongue normal  Neck: Supple, trachea midline Back: No tenderness or bony abnormality  Lungs: Clear to auscultation bilaterally. No wheezes, rhonchi or rales. Breathing comfortably on room air Chest Wall: Nontender, no deformity Heart: Regular rate and rhythm, no murmur/rub/gallop Abdomen: Soft, nontender, nondistended, no rebound/guarding Extremities: Normal, atraumatic, warm and well perfused, no edema. R knee tender to palpation. No deformities palpated. No effusions. No erythema or warmth. Full ROM. Pulses: 2+ throughout Skin: No rashes or lesions Neurologic: Alert and oriented x 3. Normal strength and sensation   Lab results: Basic Metabolic Panel:  Recent Labs  02/14/14 1336 02/14/14 1855  NA 128* 134*  K 5.3 3.8  CL 87* 96  CO2 24 22  GLUCOSE 733* 350*  BUN 17 14  CREATININE 0.96 0.73  CALCIUM 9.9 9.2  Anion gap 17  Liver Function Tests:  Recent Labs  02/14/14 1336  AST 22  ALT 16  ALKPHOS 123*  BILITOT 0.3  PROT 8.3  ALBUMIN 4.0   CBC:  Recent Labs  02/14/14 1336  WBC 7.2  NEUTROABS 5.1  HGB 13.9  HCT 41.5  MCV 80.9  PLT 232   CBG:  Recent Labs  02/14/14 1324 02/14/14 1615 02/14/14 1734  GLUCAP >600* 580* 443*   Urine Drug Screen: Drugs of Abuse     Component Value Date/Time   LABOPIA NONE DETECTED 02/04/2014 0630   LABOPIA NEG 11/22/2011  1402   COCAINSCRNUR NONE DETECTED 02/04/2014 0630   COCAINSCRNUR NEG 11/22/2011 1402   LABBENZ NONE DETECTED 02/04/2014 0630   LABBENZ NEG 11/22/2011 1402   LABBENZ NEG 08/07/2011 1217   AMPHETMU NONE DETECTED 02/04/2014 0630   AMPHETMU NEG 08/07/2011 1217   THCU NONE DETECTED 02/04/2014 0630   LABBARB NONE DETECTED 02/04/2014 0630   LABBARB NEG 11/22/2011 1402    Urinalysis:  Recent Labs  02/14/14 1505  COLORURINE YELLOW  LABSPEC 1.033*  PHURINE 6.0  GLUCOSEU >1000*  HGBUR NEGATIVE  BILIRUBINUR NEGATIVE  KETONESUR NEGATIVE  PROTEINUR NEGATIVE  UROBILINOGEN 0.2  NITRITE NEGATIVE  LEUKOCYTESUR NEGATIVE    Assessment & Plan by Problem: Active Problems:   Hyperglycemia  Hyperglycemia: Last A1c 14.9 on 02/04/2014. On Lantus 60u qhs, glipizide 5mg  daily, and Metformin 500mg  BID at home. CBG >600, 733 on BMP. CBG 443 after 30u Novolog and 2L IVF. Concern for HHS. AG 17, no ketones in urine, so DKA unlikely. Precipitating/predisposing factor is likely inadequate insulin treatment or noncompliance. He has no signs of infection - afebrile in ED with no leukocytosis. No signs or symptoms of acute illnesses such as CVA, MI or acute pancreatitis. On recheck of BMP gap decreased to 16 and glucose was 350. -Admit to IMTS -Continue NS @ 150cc/hr -25u lantus tonight -SSI, Accuchecks Q4hr -Diabetes educator consult in AM  Vomiting, diarrhea: 1 episode of each. Denies nausea presently. Subjective fevers at home but afebrile here with no leukocytosis. Likely acute viral gastroenteritis. -IVF as above -continue to monitor  Chronic knee pain: stable. -Ibuprofen prn pain -Tylenol prn pain -Capsicum cream prn  HIV: Followed by Dr. [name]. Last seen in Feb, CD4 120, VL<20 on 06/02/2013 and supposed to f/u in 43mo. On HAART: Prestiza, Truvada, Isentress, Norvir, and Dapsone for PCP prophylaxis due to Sulfa allergy. CD4 150 and VL 45 on 02/08/2014. -Continuing Prezista 800mg  QHS, Truvada  200-300mg  WHS, Isentress 400mg  BID, Norvir 100mg  QHS -Continuing Dapsone 100mg  daily  HLD: Lipid panel 06/02/13 w/ total cholesterol  268, TG 196. LDL 186. On Pravastatin 40mg  daily. -continue pravastatin 40mg  daily  ED: H/o ED attributed in part to his previously uncontrolled DM2. Serum testosterone in '10 was 238.95 and 274 on 02/04/2014. Normal range 350-890. -Continuing testosterone gel daily   HTN: Not on any home medications. Normotensive presently -continue ASA 81mg  daily  FEN: -Heart healthy/carb modified -NS @ 150cc/hr  DVT PPx: lovenox daily  Dispo: Disposition is deferred at this time, awaiting improvement of current medical problems. Anticipated discharge in approximately 1 day(s).   The patient does have a current PCP Keith Gallant, MD) and does need an [facility] hospital follow-up appointment after discharge.  The patient does not have transportation limitations that hinder transportation to clinic appointments.  Signed: Jacques Earthly, MD 02/14/2014, 6:27 PM

## 2014-02-14 NOTE — ED Provider Notes (Signed)
Discussed case in great detail with [facility], PA-C. Transfer of care to this provider and change in shift.  Keith [name] is a 48 year old male with past medical history of HIV/AIDS, hypertension, diabetes type 2, osteomyelitis, chronic knee pain, chronic pain syndrome presenting to the emergency department with hyperglycemia. Upon arrival to the emergency department patient's blood glucose level was approximately 733, CBG greater than 600. Patient has been admitted to the hospital before, 02/04/2014, regarding hyperglycemia. Alert and oriented. Heart rate and rhythm normal. Lungs clear to auscultation to upper and lower lobes bilaterally. Cap refill less than 3 seconds. Radial pulses 2+ bilaterally. GCS 15. Patient falls commands well. Patient responds to questions appropriately. Strength 5+/5+ upper and lower extremities bilaterally.  CBC unremarkable. Initial CMP noted glucose of 733, mildly elevated anion gap of 17.0 mEq/L. Bicarb 24. Negative findings of DKA. UA negative for ketones - negative findings of infection. VBG noted elevated bicarb of 26.9 and elevated pH of 7.370. After 2 L of IV fluids, 30 units of short acting insulin patient continues to have elevated glucose levels of 443. Patient to be admitted for hyperglycemia without ketoacidosis.   Medications  sodium chloride 0.9 % bolus 1,000 mL (0 mLs Intravenous Stopped 02/14/14 1610)  insulin aspart (novoLOG) injection 20 Units (20 Units Subcutaneous Given 02/14/14 1529)  sodium chloride 0.9 % bolus 1,000 mL (1,000 mLs Intravenous New Bag/Given 02/14/14 1621)  insulin aspart (novoLOG) injection 10 Units (10 Units Subcutaneous Given 02/14/14 1632)   Filed Vitals:   02/14/14 1325 02/14/14 1609 02/14/14 1735  BP: 105/78 126/96 136/98  Pulse: 100 79 75  Temp: 98.6 F (37 C)  97.6 F (36.4 C)  TempSrc: Oral  Oral  Resp: 16 14 20   SpO2: 100% 95% 96%   5:48 PM This provider spoke with Internal Medicine Teaching Services, Dr.  Denton Brick Resident. Discussed case, labs, ED course, vitals, history and great detail. Patient to be admitted to internal medicine teaching services regarding hyperglycemia. This provided discuss admission with patient agreed to plan of care. Patient understood. Patient stable for transfer.  Jamse Mead, PA-C 02/14/14 [place], MD 02/14/14 1843

## 2014-02-14 NOTE — ED Notes (Addendum)
States sugar is up  For a copuple of days feels bad has not taken any of meds today  Because he has feeling bad  But states has eaten

## 2014-02-14 NOTE — ED Notes (Signed)
cbg 588

## 2014-02-14 NOTE — ED Notes (Signed)
Admitting at bedside 

## 2014-02-14 NOTE — ED Notes (Signed)
POCT CBG resulted HIGH >600; Jenny Reichmann, RN present in room

## 2014-02-14 NOTE — ED Provider Notes (Signed)
CSN: [id number]     Arrival date & time 02/14/14  1309 History   First MD Initiated Contact with Patient 02/14/14 1456     Chief Complaint  Patient presents with  . Hyperglycemia     (Consider location/radiation/quality/duration/timing/severity/associated sxs/prior Treatment) The history is provided by the patient and medical records.     Keith [name] is a 48 y.o. male  with a hx of HTN, HIV, NIDDM presents to the Emergency Department complaining of gradual, persistent, progressively worsening hyperglycemia onset approximately 1 week ago. Patient reports that he takes Lantus, 60 units at bedtime but does not take a short acting insulin. He reports his blood sugars were around 200 when he was discharged from the hospital on 02/04/2014. Patient reports that they have steadily risen since that time and in the last 3 days he has developed blurred vision, fatigue, polydipsia and polyuria. Patient denies other associated somatic symptoms.  No aggravating or alleviating factors.  Pt denies fever, chills, headache, neck pain, chest pain, shortness of breath, abdominal pain, nausea, vomiting, diarrhea, syncope, dysuria, hematuria.  Pt has had several ED visits for hyperglycemia, the last requiring admission with discharge approx 9 days ago.     Past Medical History  Diagnosis Date  . HIV (human immunodeficiency virus infection) 2009    CD4 count 100, VL 13800 (05/01/2010)  . Diabetes type 2, uncontrolled     HgA1c 17.6 (04/27/2010)  . Hypertension   . Genital warts   . Erectile dysfunction   . Chronic knee pain     right  . Osteomyelitis     h/o hand  . Diabetes mellitus   . Chronic pain   . AIDS    Past Surgical History  Procedure Laterality Date  . Multiple extractions with alveoloplasty N/A 01/18/2013    Procedure: MULTIPLE EXTRACION 3, 6, 7, 10, 11, 13, 21, 22, 27, 28, 29, 30 WITH ALVEOLOPLASTY;  Surgeon: Gae Bon, DDS;  Location: [place];  Service: Oral Surgery;  Laterality:  N/A;  . Hernia repair     Family History  Problem Relation Age of Onset  . Hypertension Mother   . Arthritis Father   . Hypertension Father   . Hypertension Brother   . Cancer Maternal Grandmother 29    unknown type of cancer  . Depression Paternal Grandmother    History  Substance Use Topics  . Smoking status: Never Smoker   . Smokeless tobacco: Never Used  . Alcohol Use: No    Review of Systems  Constitutional: Positive for fatigue. Negative for fever, diaphoresis, appetite change and unexpected weight change.  HENT: Negative for mouth sores.   Eyes: Positive for visual disturbance.  Respiratory: Negative for cough, chest tightness, shortness of breath and wheezing.   Cardiovascular: Negative for chest pain.  Gastrointestinal: Negative for nausea, vomiting, abdominal pain, diarrhea and constipation.  Endocrine: Positive for polydipsia and polyuria. Negative for polyphagia.  Genitourinary: Negative for dysuria, urgency, frequency and hematuria.  Musculoskeletal: Negative for back pain and neck stiffness.  Skin: Negative for rash.  Allergic/Immunologic: Negative for immunocompromised state.  Neurological: Negative for syncope, light-headedness and headaches.  Hematological: Does not bruise/bleed easily.  Psychiatric/Behavioral: Negative for sleep disturbance. The patient is not nervous/anxious.       Allergies  Sulfa antibiotics  Home Medications   Prior to Admission medications   Medication Sig Start Date End Date Taking? Authorizing Provider  aspirin 81 MG EC tablet Take 81 mg by mouth daily.  08/20/11  Yes  [name], MD  dapsone 100 MG tablet Take 100 mg by mouth daily.   Yes Historical Provider, MD  Darunavir Ethanolate (PREZISTA) 800 MG tablet Take 800 mg by mouth daily with breakfast.    Yes Historical Provider, MD  emtricitabine-tenofovir (TRUVADA) 200-300 MG per tablet Take 1 tablet by mouth daily.    Yes Historical Provider, MD  glipiZIDE (GLUCOTROL  XL) 5 MG 24 hr tablet Take 1 tablet (5 mg total) by mouth daily with breakfast. 02/05/14  Yes Corky Sox, MD  Insulin Glargine (LANTUS SOLOSTAR) 100 UNIT/ML Solostar Pen Inject 50 Units into the skin daily at 10 pm. Patient taking differently: Inject 60 Units into the skin daily at 10 pm.  01/03/14  Yes Karren Cobble, MD  ISENTRESS 400 MG tablet TAKE 1 TABLET BY MOUTH TWICE DAILY 02/14/14  Yes [name], MD  metFORMIN (GLUCOPHAGE) 500 MG tablet Take 1 tablet (500 mg total) by mouth 2 (two) times daily with a meal. 01/03/14  Yes Karren Cobble, MD  NORVIR 100 MG TABS tablet TAKE 1 TABLET BY MOUTH EVERY DAY WITH BREAKFAST 02/14/14  Yes [name], MD  Oxycodone HCl 10 MG TABS Take 10 mg by mouth 3 (three) times daily.   Yes Historical Provider, MD  pravastatin (PRAVACHOL) 40 MG tablet Take 1 tablet (40 mg total) by mouth daily. 01/03/14  Yes Karren Cobble, MD  raltegravir (ISENTRESS) 400 MG tablet Take 400 mg by mouth 2 (two) times daily.   Yes Historical Provider, MD  ritonavir (NORVIR) 100 MG TABS tablet Take 100 mg by mouth daily with breakfast.    Yes Historical Provider, MD  testosterone (ANDROGEL) 50 MG/5GM GEL Place 5 g onto the skin daily.    Yes Historical Provider, MD  dapsone 100 MG tablet TAKE 1 TABLET BY MOUTH EVERY DAY 02/14/14   [name], MD  Insulin Pen Needle 31G X 5 MM MISC 1 application by Does not apply route once. diag code 250.02. Insulin dependent. Used for insulin inj 1 time daily 11/23/12   Clinton Gallant, MD  oxyCODONE (ROXICODONE) 5 MG immediate release tablet Take 1-2 tablets every 4-6 hours as needed for pain control 02/05/14   Corky Sox, MD  PREZISTA 800 MG tablet TAKE 1 TABLET BY MOUTH EVERY DAY WITH BREAKFAST 02/14/14   [name], MD  TRUVADA 200-300 MG per tablet TAKE 1 TABLET BY MOUTH EVERY DAY 02/14/14   [name], MD   BP 126/96 mmHg  Pulse 79  Temp(Src) 98.6 F (37 C) (Oral)  Resp 14  SpO2 95% Physical Exam   Constitutional: He appears well-developed and well-nourished. No distress.  Awake, alert, nontoxic appearance  HENT:  Head: Normocephalic and atraumatic.  Mouth/Throat: Oropharynx is clear and moist. Mucous membranes are dry. No oropharyngeal exudate.  Dry mucous membranes  Eyes: Conjunctivae are normal. No scleral icterus.  Neck: Normal range of motion. Neck supple.  Cardiovascular: Normal rate, regular rhythm, normal heart sounds and intact distal pulses.   No murmur heard. Pulmonary/Chest: Effort normal and breath sounds normal. No respiratory distress. He has no wheezes.  Equal chest expansion  Abdominal: Soft. Bowel sounds are normal. He exhibits no mass. There is no tenderness. There is no rebound and no guarding.  Musculoskeletal: Normal range of motion. He exhibits no edema.  Neurological: He is alert. He exhibits normal muscle tone. Coordination normal.  Speech is clear and goal oriented Moves extremities without ataxia  Skin: Skin  is warm and dry. He is not diaphoretic. No erythema.  Psychiatric: He has a normal mood and affect.  Nursing note and vitals reviewed.   ED Course  Procedures (including critical care time) Labs Review Labs Reviewed  COMPREHENSIVE METABOLIC PANEL - Abnormal; Notable for the following:    Sodium 128 (*)    Chloride 87 (*)    Glucose, Bld 733 (*)    Alkaline Phosphatase 123 (*)    Anion gap 17 (*)    All other components within normal limits  URINALYSIS, ROUTINE W REFLEX MICROSCOPIC - Abnormal; Notable for the following:    Specific Gravity, Urine 1.033 (*)    Glucose, UA >1000 (*)    All other components within normal limits  CBG MONITORING, ED - Abnormal; Notable for the following:    Glucose-Capillary >600 (*)    All other components within normal limits  CBG MONITORING, ED - Abnormal; Notable for the following:    Glucose-Capillary 580 (*)    All other components within normal limits  I-STAT VENOUS BLOOD GAS, ED - Abnormal; Notable  for the following:    pH, Ven 7.370 (*)    pO2, Ven 60.0 (*)    Bicarbonate 26.9 (*)    All other components within normal limits  CBC WITH DIFFERENTIAL  URINE MICROSCOPIC-ADD ON  BLOOD GAS, VENOUS    Imaging Review No results found.   EKG Interpretation None      MDM   Final diagnoses:  Hyperglycemia without ketosis   Keith [name] presents with hyperglycemia, greater than 700. Patient reports compliance. Low anion gap. We'll give fluids and subcutaneous insulin. If no improvement will admit.  4:00PM Patient with decrease in CBG to 588. Will give another 10 units of subcutaneous insulin and fluid bolus and reassess.  5:12 PM Patient with some improvement. He has had a total of 30 units of subcutaneous insulin and 2 L of fluids. Patient care transferred to [facility], PA-C.  Plan: patient will complete second fluid bolus. If his blood sugar remains high or his VBG shows acidosis he will be admitted; if blood sugar decreases and patient's symptoms resolved may be discharged home with close PCP follow-up.   Jarrett Soho Kathern Lobosco, PA-C 02/14/14 1715  Charlesetta Shanks, MD 02/14/14 1728

## 2014-02-15 DIAGNOSIS — I10 Essential (primary) hypertension: Secondary | ICD-10-CM

## 2014-02-15 DIAGNOSIS — R739 Hyperglycemia, unspecified: Secondary | ICD-10-CM | POA: Diagnosis present

## 2014-02-15 DIAGNOSIS — Z794 Long term (current) use of insulin: Secondary | ICD-10-CM

## 2014-02-15 DIAGNOSIS — E1165 Type 2 diabetes mellitus with hyperglycemia: Secondary | ICD-10-CM

## 2014-02-15 DIAGNOSIS — B20 Human immunodeficiency virus [HIV] disease: Secondary | ICD-10-CM

## 2014-02-15 DIAGNOSIS — N529 Male erectile dysfunction, unspecified: Secondary | ICD-10-CM

## 2014-02-15 DIAGNOSIS — E785 Hyperlipidemia, unspecified: Secondary | ICD-10-CM

## 2014-02-15 LAB — GLUCOSE, CAPILLARY
Glucose-Capillary: 194 mg/dL — ABNORMAL HIGH (ref 70–99)
Glucose-Capillary: 253 mg/dL — ABNORMAL HIGH (ref 70–99)
Glucose-Capillary: 268 mg/dL — ABNORMAL HIGH (ref 70–99)
Glucose-Capillary: 317 mg/dL — ABNORMAL HIGH (ref 70–99)
Glucose-Capillary: 358 mg/dL — ABNORMAL HIGH (ref 70–99)
Glucose-Capillary: 387 mg/dL — ABNORMAL HIGH (ref 70–99)

## 2014-02-15 LAB — BASIC METABOLIC PANEL
Anion gap: 13 (ref 5–15)
BUN: 11 mg/dL (ref 6–23)
CO2: 24 mEq/L (ref 19–32)
Calcium: 8.7 mg/dL (ref 8.4–10.5)
Chloride: 100 mEq/L (ref 96–112)
Creatinine, Ser: 0.65 mg/dL (ref 0.50–1.35)
GFR calc Af Amer: >90 mL/min (ref >90)
GFR calc non Af Amer: >90 mL/min (ref >90)
Glucose, Bld: 268 mg/dL — ABNORMAL HIGH (ref 70–99)
Potassium: 3.8 mEq/L (ref 3.7–5.3)
Sodium: 137 mEq/L (ref 137–147)

## 2014-02-15 LAB — MRSA PCR SCREENING: MRSA by PCR: POSITIVE — AB

## 2014-02-15 MED ORDER — INSULIN ASPART 100 UNIT/ML ~~LOC~~ SOLN
6.0000 [IU] | Freq: Three times a day (TID) | SUBCUTANEOUS | Status: DC
  Administered 2014-02-15 (×2): 6 [IU] via SUBCUTANEOUS

## 2014-02-15 MED ORDER — ENOXAPARIN SODIUM 60 MG/0.6ML ~~LOC~~ SOLN
60.0000 mg | SUBCUTANEOUS | Status: DC
  Administered 2014-02-15 – 2014-02-16 (×2): 60 mg via SUBCUTANEOUS
  Filled 2014-02-15 (×3): qty 0.6

## 2014-02-15 MED ORDER — INSULIN GLARGINE 100 UNIT/ML ~~LOC~~ SOLN
25.0000 [IU] | Freq: Once | SUBCUTANEOUS | Status: AC
  Administered 2014-02-15: 25 [IU] via SUBCUTANEOUS
  Filled 2014-02-15: qty 0.25

## 2014-02-15 MED ORDER — PRAVASTATIN SODIUM 40 MG PO TABS
40.0000 mg | ORAL_TABLET | Freq: Every day | ORAL | Status: DC
  Administered 2014-02-15 – 2014-02-16 (×2): 40 mg via ORAL
  Filled 2014-02-15 (×3): qty 1

## 2014-02-15 MED ORDER — INSULIN GLARGINE 100 UNIT/ML ~~LOC~~ SOLN
50.0000 [IU] | Freq: Every day | SUBCUTANEOUS | Status: DC
Start: 2014-02-15 — End: 2014-02-16
  Administered 2014-02-15: 50 [IU] via SUBCUTANEOUS
  Filled 2014-02-15 (×2): qty 0.5

## 2014-02-15 MED ORDER — ASPIRIN EC 81 MG PO TBEC
81.0000 mg | DELAYED_RELEASE_TABLET | Freq: Every day | ORAL | Status: DC
Start: 2014-02-15 — End: 2014-02-17
  Administered 2014-02-15 – 2014-02-17 (×3): 81 mg via ORAL
  Filled 2014-02-15 (×5): qty 1

## 2014-02-15 MED ORDER — TESTOSTERONE 50 MG/5GM (1%) TD GEL
5.0000 g | Freq: Every day | TRANSDERMAL | Status: DC
  Administered 2014-02-16 – 2014-02-17 (×2): 5 g via TRANSDERMAL
  Filled 2014-02-15 (×2): qty 5

## 2014-02-15 MED ORDER — DARUNAVIR ETHANOLATE 800 MG PO TABS
800.0000 mg | ORAL_TABLET | Freq: Every day | ORAL | Status: DC
  Administered 2014-02-15 – 2014-02-17 (×3): 800 mg via ORAL
  Filled 2014-02-15 (×4): qty 1

## 2014-02-15 MED ORDER — DAPSONE 100 MG PO TABS
100.0000 mg | ORAL_TABLET | Freq: Every day | ORAL | Status: DC
  Administered 2014-02-15 – 2014-02-17 (×3): 100 mg via ORAL
  Filled 2014-02-15 (×3): qty 1

## 2014-02-15 MED ORDER — EMTRICITABINE-TENOFOVIR DF 200-300 MG PO TABS
1.0000 | ORAL_TABLET | Freq: Every day | ORAL | Status: DC
  Administered 2014-02-15 – 2014-02-17 (×3): 1 via ORAL
  Filled 2014-02-15 (×3): qty 1

## 2014-02-15 MED ORDER — LIVING WELL WITH DIABETES BOOK
Freq: Once | Status: AC
  Administered 2014-02-15: 14:00:00
  Filled 2014-02-15: qty 1

## 2014-02-15 MED ORDER — INSULIN ASPART 100 UNIT/ML ~~LOC~~ SOLN
0.0000 [IU] | Freq: Three times a day (TID) | SUBCUTANEOUS | Status: DC
  Administered 2014-02-15: 2 [IU] via SUBCUTANEOUS
  Administered 2014-02-15: 5 [IU] via SUBCUTANEOUS

## 2014-02-15 MED ORDER — RALTEGRAVIR POTASSIUM 400 MG PO TABS
400.0000 mg | ORAL_TABLET | Freq: Two times a day (BID) | ORAL | Status: DC
  Administered 2014-02-15 – 2014-02-17 (×5): 400 mg via ORAL
  Filled 2014-02-15 (×6): qty 1

## 2014-02-15 MED ORDER — RITONAVIR 100 MG PO TABS
100.0000 mg | ORAL_TABLET | Freq: Every day | ORAL | Status: DC
  Administered 2014-02-15 – 2014-02-17 (×3): 100 mg via ORAL
  Filled 2014-02-15 (×3): qty 1

## 2014-02-15 NOTE — Progress Notes (Signed)
UR completed 

## 2014-02-15 NOTE — Progress Notes (Signed)
Subjective: No acute events overnight. No new complaints this morning.  Objective: Vital signs in last 24 hours: Filed Vitals:   02/14/14 2228 02/15/14 0127 02/15/14 0618 02/15/14 1100  BP: 120/89 113/76 113/74 111/78  Pulse: 71 74 66 73  Temp: 98.2 F (36.8 C) 97.7 F (36.5 C) 97.9 F (36.6 C) 98.9 F (37.2 C)  TempSrc: Oral Oral Oral Oral  Resp: 16 16 16 18   Height:      Weight:      SpO2: 100% 99% 100% 99%   Weight change:   Intake/Output Summary (Last 24 hours) at 02/15/14 1149 Last data filed at 02/15/14 0618  Gross per 24 hour  Intake      0 ml  Output   1700 ml  Net  -1700 ml   General Apperance: NAD Head: Normocephalic, atraumatic Eyes: PERRL, EOMI, anicteric sclera Ears: Normal external ear canal Nose: Nares normal, septum midline, mucosa normal Throat: Lips, mucosa and tongue normal  Neck: Supple, trachea midline Lungs: Clear to auscultation bilaterally. No wheezes, rhonchi or rales. Breathing comfortably on room air Heart: Regular rate and rhythm, no murmur/rub/gallop Abdomen: Soft, nontender, nondistended, no rebound/guarding Extremities: Normal, atraumatic, warm and well perfused, no edema.  Pulses: 2+ throughout Neurologic: Alert and oriented x 3. Normal strength and sensation  Lab Results: Basic Metabolic Panel:  Recent Labs Lab 02/14/14 1855 02/15/14 0823  NA 134* 137  K 3.8 3.8  CL 96 100  CO2 22 24  GLUCOSE 350* 268*  BUN 14 11  CREATININE 0.73 0.65  CALCIUM 9.2 8.7   Liver Function Tests:  Recent Labs Lab 02/14/14 1336  AST 22  ALT 16  ALKPHOS 123*  BILITOT 0.3  PROT 8.3  ALBUMIN 4.0   CBC:  Recent Labs Lab 02/14/14 1336  WBC 7.2  NEUTROABS 5.1  HGB 13.9  HCT 41.5  MCV 80.9  PLT 232   CBG:  Recent Labs Lab 02/14/14 1734 02/14/14 2023 02/15/14 0028 02/15/14 0447 02/15/14 0813 02/15/14 1132  GLUCAP 443* 218* 268* 387* 358* 253*   Urinalysis:  Recent Labs Lab 02/14/14 1505  COLORURINE YELLOW    LABSPEC 1.033*  PHURINE 6.0  GLUCOSEU >1000*  HGBUR NEGATIVE  BILIRUBINUR NEGATIVE  KETONESUR NEGATIVE  PROTEINUR NEGATIVE  UROBILINOGEN 0.2  NITRITE NEGATIVE  LEUKOCYTESUR NEGATIVE    Micro Results: Recent Results (from the past 240 hour(s))  MRSA PCR Screening     Status: Abnormal   Collection Time: 02/14/14  8:13 PM  Result Value Ref Range Status   MRSA by PCR POSITIVE (A) NEGATIVE Final    Comment:        The GeneXpert MRSA Assay (FDA approved for NASAL specimens only), is one component of a comprehensive MRSA colonization surveillance program. It is not intended to diagnose MRSA infection nor to guide or monitor treatment for MRSA infections. RESULT CALLED TO, READ BACK BY AND VERIFIED WITH: D.HOLLOWAY,RN 0114 02/15/14 M.CAMPBELL    Studies/Results: No results found.   Medications: I have reviewed the patient's current medications. Scheduled Meds: . aspirin EC  81 mg Oral Daily  . dapsone  100 mg Oral Daily  . Darunavir Ethanolate  800 mg Oral Q breakfast  . emtricitabine-tenofovir  1 tablet Oral Daily  . enoxaparin (LOVENOX) injection  55 mg Subcutaneous Q24H  . insulin aspart  0-9 Units Subcutaneous TID WC  . insulin aspart  6 Units Subcutaneous TID WC  . insulin glargine  50 Units Subcutaneous QHS  . pravastatin  40 mg  Oral q1800  . raltegravir  400 mg Oral BID  . ritonavir  100 mg Oral Q breakfast  . sodium chloride  1,000 mL Intravenous Once  . testosterone  5 g Transdermal Daily   Continuous Infusions: . sodium chloride 150 mL/hr at 02/15/14 0956   PRN Meds:.acetaminophen, capsicum, ibuprofen Assessment/Plan: Active Problems:   Hyperglycemia  Hyperglycemia: Blood glucoses 218-387 overnight. BMP with glucose 268 and anion gap 13, otherwise wnl. -d/c NS @ 150cc/hr -25u lantus once this morning, 50u QHS -Novolog 6u TID -SSI, Accuchecks AC/HS  Vomiting, diarrhea: No further episodes.  Chronic knee pain: stable. -Ibuprofen prn pain -Tylenol  prn pain -Capsicum cream prn  HIV:  -Continuing Prezista 800mg  QHS, Truvada 200-300mg  WHS, Isentress 400mg  BID, Norvir 100mg  QHS -Continuing Dapsone 100mg  daily  HLD:  -continue pravastatin 40mg  daily  ED:  -Continuing testosterone gel daily   HTN:  -continue ASA 81mg  daily  FEN: -Heart healthy/carb modified  DVT PPx: lovenox daily  Dispo: Disposition is deferred at this time, awaiting improvement of current medical problems.  Anticipated discharge in approximately 1 day(s).   The patient does have a current PCP Clinton Gallant, MD) and does need an [facility] hospital follow-up appointment after discharge.  The patient does not have transportation limitations that hinder transportation to clinic appointments.  .Services Needed at time of discharge: Y = Yes, Blank = No PT:   OT:   RN:   Equipment:   Other:     LOS: 1 day   Jacques Earthly, MD 02/15/2014, 11:49 AM

## 2014-02-15 NOTE — Progress Notes (Addendum)
Inpatient Diabetes Program Recommendations  AACE/ADA: New Consensus Statement on Inpatient Glycemic Control (2013)  Target Ranges:  Prepandial:   less than 140 mg/dL      Peak postprandial:   less than 180 mg/dL (1-2 hours)      Critically ill patients:  140 - 180 mg/dL  Reason for Visit:  Elevated BS and referral Outpatient Diabetes medications: Lantus 60 units; Glucotrol XL; Metformin 500 mg BID Current orders for Inpatient glycemic control: Diet CHO mod; Lantus 25 BID, sensitive correction TID; New order:  Lantus 50 units qhs (which will be a 50% increase for about 12 hours)   Pt states that blood sugars run 250-300 at home.  Pt states that he eats one meal/day.  He says that he eats mostly fish and broiled chicken.  Pt. does not verbalize a good understanding of carbohydrates in the diet.  Pt denies any concerns over adding a short acting insulin to his regimen. Recommendations: Outpatient Diabetes Classes.  Will likely need meal coverage and an increase in correction.    [place], CDE. M.Ed. Pager [phone] Inpatient Diabetes Coordinator  Emailed Donna Plyler, diabetes educator at Internal Medicine, regarding need for OP DM education.  [place], CDE. M.Ed. Pager [phone] Inpatient Diabetes Coordinator

## 2014-02-16 DIAGNOSIS — E1165 Type 2 diabetes mellitus with hyperglycemia: Secondary | ICD-10-CM | POA: Diagnosis not present

## 2014-02-16 LAB — BASIC METABOLIC PANEL
Anion gap: 13 (ref 5–15)
BUN: 10 mg/dL (ref 6–23)
CO2: 23 mEq/L (ref 19–32)
Calcium: 8.7 mg/dL (ref 8.4–10.5)
Chloride: 98 mEq/L (ref 96–112)
Creatinine, Ser: 0.66 mg/dL (ref 0.50–1.35)
GFR calc Af Amer: >90 mL/min (ref >90)
GFR calc non Af Amer: >90 mL/min (ref >90)
Glucose, Bld: 355 mg/dL — ABNORMAL HIGH (ref 70–99)
Potassium: 3.9 mEq/L (ref 3.7–5.3)
Sodium: 134 mEq/L — ABNORMAL LOW (ref 137–147)

## 2014-02-16 LAB — GLUCOSE, CAPILLARY
Glucose-Capillary: 431 mg/dL — ABNORMAL HIGH (ref 70–99)
Glucose-Capillary: 400 mg/dL — ABNORMAL HIGH (ref 70–99)
Glucose-Capillary: 332 mg/dL — ABNORMAL HIGH (ref 70–99)
Glucose-Capillary: 168 mg/dL — ABNORMAL HIGH (ref 70–99)
Glucose-Capillary: 232 mg/dL — ABNORMAL HIGH (ref 70–99)
Glucose-Capillary: 327 mg/dL — ABNORMAL HIGH (ref 70–99)

## 2014-02-16 MED ORDER — INSULIN ASPART 100 UNIT/ML ~~LOC~~ SOLN
0.0000 [IU] | Freq: Three times a day (TID) | SUBCUTANEOUS | Status: DC
  Administered 2014-02-16: 3 [IU] via SUBCUTANEOUS
  Administered 2014-02-16: 2 [IU] via SUBCUTANEOUS
  Administered 2014-02-16: 7 [IU] via SUBCUTANEOUS
  Administered 2014-02-17: 3 [IU] via SUBCUTANEOUS
  Administered 2014-02-17: 5 [IU] via SUBCUTANEOUS

## 2014-02-16 MED ORDER — INSULIN ASPART 100 UNIT/ML ~~LOC~~ SOLN
5.0000 [IU] | Freq: Once | SUBCUTANEOUS | Status: AC
  Administered 2014-02-16: 5 [IU] via SUBCUTANEOUS

## 2014-02-16 MED ORDER — INSULIN GLARGINE 100 UNIT/ML ~~LOC~~ SOLN
60.0000 [IU] | Freq: Every day | SUBCUTANEOUS | Status: DC
  Administered 2014-02-16: 60 [IU] via SUBCUTANEOUS
  Filled 2014-02-16 (×2): qty 0.6

## 2014-02-16 MED ORDER — INSULIN ASPART 100 UNIT/ML ~~LOC~~ SOLN
0.0000 [IU] | Freq: Every day | SUBCUTANEOUS | Status: DC
  Administered 2014-02-16: 4 [IU] via SUBCUTANEOUS

## 2014-02-16 MED ORDER — INSULIN ASPART 100 UNIT/ML ~~LOC~~ SOLN
8.0000 [IU] | Freq: Three times a day (TID) | SUBCUTANEOUS | Status: DC
  Administered 2014-02-16 – 2014-02-17 (×5): 8 [IU] via SUBCUTANEOUS

## 2014-02-16 MED ORDER — MUPIROCIN 2 % EX OINT
TOPICAL_OINTMENT | Freq: Two times a day (BID) | CUTANEOUS | Status: DC
  Administered 2014-02-16: 1 via NASAL
  Administered 2014-02-16 – 2014-02-17 (×2): via NASAL
  Filled 2014-02-16: qty 22

## 2014-02-16 NOTE — Progress Notes (Signed)
ABN ( Advance Beneficiary Notice of Noncoverage) issued to patient/ continued observational stay; form read to patient with understanding and a copy given to patient; Aneta Mins [id number]

## 2014-02-16 NOTE — Progress Notes (Signed)
Inpatient Diabetes Program Recommendations  AACE/ADA: New Consensus Statement on Inpatient Glycemic Control  Target Ranges:  Prepandial:   less than 140 mg/dL      Peak postprandial:   less than 180 mg/dL (1-2 hours)      Critically ill patients:  140 - 180 mg/dL  Pager:  [phone] Hours:  8 am-10pm   Reason for Visit: Diabetes Consult    Visited with patient today to discuss plans for adding short acting insulin to his regimen.  We discussed the need to check his blood glucose more than once a day.  He stated he will check his glucose before each meal as directed.  We also discussed the need to reduce his carbohydrate intake and limit to 60 CHOs per meal.  We reviewed food labels together.   Patient would also like a new glucometer.  Please send prescription for new meter at discharge.  Courtney Heys PhD, RN, BC-ADM Diabetes Coordinator  Office:  [phone] Team Pager:  [phone]

## 2014-02-16 NOTE — Progress Notes (Addendum)
At [place], pt's blood glucose was 431. MD on call made aware. Dr. Redmond Pulling ordered to re-stick for CBG. A different finger was used; first drop of blood was wiped away and there was no need to squeeze finger. Around 0135, CBG 400. Meter has been docked at this time. Pt denies any symptoms at this time other than feeling "very thirsty". Dr. Raelene Bott ordered for 5 units of Novolog to be given at this time. Will continue to monitor for changes in pt condition following administration of medication. Elspeth Cho, RN 02/16/2014 [phone]

## 2014-02-16 NOTE — Progress Notes (Signed)
Subjective: No acute events overnight. No new complaints this morning. Reports vision is still blurry.  Objective: Vital signs in last 24 hours: Filed Vitals:   02/15/14 1801 02/16/14 0611 02/16/14 0933 02/16/14 1326  BP: 116/72 112/71 132/88 126/78  Pulse: 68 74 73 79  Temp: 98.7 F (37.1 C) 97.6 F (36.4 C) 98.4 F (36.9 C) 97.4 F (36.3 C)  TempSrc: Oral Oral Oral Oral  Resp: 18 20 20 20   Height:      Weight:      SpO2: 99% 99% 99% 100%   Weight change:   Intake/Output Summary (Last 24 hours) at 02/16/14 1331 Last data filed at 02/16/14 1327  Gross per 24 hour  Intake    720 ml  Output   1650 ml  Net   -930 ml   General Apperance: NAD Head: Normocephalic, atraumatic Eyes: PERRL, EOMI, anicteric sclera Ears: Normal external ear canal Nose: Nares normal, septum midline, mucosa normal Throat: Lips, mucosa and tongue normal  Neck: Supple, trachea midline Lungs: Clear to auscultation bilaterally. No wheezes, rhonchi or rales. Breathing comfortably on room air Heart: Regular rate and rhythm, no murmur/rub/gallop Abdomen: Soft, nontender, nondistended, no rebound/guarding Extremities: Normal, atraumatic, warm and well perfused, no edema.  Pulses: 2+ throughout Neurologic: Alert and oriented x 3. Normal strength and sensation  Lab Results: Basic Metabolic Panel:  Recent Labs Lab 02/15/14 0823 02/16/14 0648  NA 137 134*  K 3.8 3.9  CL 100 98  CO2 24 23  GLUCOSE 268* 355*  BUN 11 10  CREATININE 0.65 0.66  CALCIUM 8.7 8.7   Liver Function Tests:  Recent Labs Lab 02/14/14 1336  AST 22  ALT 16  ALKPHOS 123*  BILITOT 0.3  PROT 8.3  ALBUMIN 4.0   CBC:  Recent Labs Lab 02/14/14 1336  WBC 7.2  NEUTROABS 5.1  HGB 13.9  HCT 41.5  MCV 80.9  PLT 232   CBG:  Recent Labs Lab 02/15/14 1641 02/15/14 2022 02/16/14 0112 02/16/14 0135 02/16/14 0659 02/16/14 1132  GLUCAP 194* 317* 431* 400* 332* 168*   Urinalysis:  Recent Labs Lab  02/14/14 1505  COLORURINE YELLOW  LABSPEC 1.033*  PHURINE 6.0  GLUCOSEU >1000*  HGBUR NEGATIVE  BILIRUBINUR NEGATIVE  KETONESUR NEGATIVE  PROTEINUR NEGATIVE  UROBILINOGEN 0.2  NITRITE NEGATIVE  LEUKOCYTESUR NEGATIVE    Micro Results: Recent Results (from the past 240 hour(s))  MRSA PCR Screening     Status: Abnormal   Collection Time: 02/14/14  8:13 PM  Result Value Ref Range Status   MRSA by PCR POSITIVE (A) NEGATIVE Final    Comment:        The GeneXpert MRSA Assay (FDA approved for NASAL specimens only), is one component of a comprehensive MRSA colonization surveillance program. It is not intended to diagnose MRSA infection nor to guide or monitor treatment for MRSA infections. RESULT CALLED TO, READ BACK BY AND VERIFIED WITH: D.HOLLOWAY,RN 0114 02/15/14 M.CAMPBELL    Studies/Results: No results found.   Medications: I have reviewed the patient's current medications. Scheduled Meds: . aspirin EC  81 mg Oral Daily  . dapsone  100 mg Oral Daily  . Darunavir Ethanolate  800 mg Oral Q breakfast  . emtricitabine-tenofovir  1 tablet Oral Daily  . enoxaparin (LOVENOX) injection  60 mg Subcutaneous Q24H  . insulin aspart  0-5 Units Subcutaneous QHS  . insulin aspart  0-9 Units Subcutaneous TID WC  . insulin aspart  8 Units Subcutaneous TID WC  . insulin  glargine  60 Units Subcutaneous QHS  . mupirocin ointment   Nasal BID  . pravastatin  40 mg Oral q1800  . raltegravir  400 mg Oral BID  . ritonavir  100 mg Oral Q breakfast  . sodium chloride  1,000 mL Intravenous Once  . testosterone  5 g Transdermal Daily   Continuous Infusions:   PRN Meds:.acetaminophen, capsicum, ibuprofen Assessment/Plan: Active Problems:   Hyperglycemia   HIV disease   Hyperglycemia without ketosis  Hyperglycemia: Blood glucoses 317 to 431 overnight. -Increase lantus to 60u QHS -Increase Novolog to 8u TID -SSI, Accuchecks AC/HS -Discussed with diabetes coordinator education on  novolog at home  Chronic knee pain: stable. Observed patient ambulating to bathroom and chair this morning without difficulty. He only took one dose of ibuprofen yesterday morning. Emphasized need for him to continue the ibuprofen to attain better pain control.  -Ibuprofen prn pain -Tylenol prn pain -Capsicum cream prn  HIV:  -Continuing Prezista 800mg  QHS, Truvada 200-300mg  WHS, Isentress 400mg  BID, Norvir 100mg  QHS -Continuing Dapsone 100mg  daily  HLD:  -continue pravastatin 40mg  daily  ED:  -Continuing testosterone gel daily   HTN:  -continue ASA 81mg  daily  FEN: -Heart healthy/carb modified  DVT PPx: lovenox daily  Dispo: Disposition is deferred at this time, awaiting improvement of current medical problems.  Anticipated discharge in approximately 1 day(s).   The patient does have a current PCP Clinton Gallant, MD) and does need an [facility] hospital follow-up appointment after discharge.  The patient does not have transportation limitations that hinder transportation to clinic appointments.  .Services Needed at time of discharge: Y = Yes, Blank = No PT:   OT:   RN:   Equipment:   Other:     LOS: 2 days   Jacques Earthly, MD 02/16/2014, 1:31 PM

## 2014-02-16 NOTE — Progress Notes (Signed)
[name], male   DOB: 12/25/1965, 48 y.o.   MRN: [id number] Medicine attending: I personally interviewed and examined this patient today and reviewed all pertinent data. I concur with the evaluation and management plan as recorded by resident physician Dr. Jacques Earthly. This man is not stable for hospital discharge today. Blood sugars remain grossly uncontrolled with values 400 and above overnight. Diabetes coordinator agreed that short acting insulin to cover his meals would help with his glycemic control. When I discussed this with the patient again this morning, he remained clueless. He needs education before he leaves the hospital so that he understands what he needs to take when he goes home. We will continue to titrate his long-acting insulin and make another adjustment for this evening's dose of Lantus.

## 2014-02-16 NOTE — Progress Notes (Signed)
UR completed 

## 2014-02-17 ENCOUNTER — Telehealth: Payer: Self-pay | Admitting: Internal Medicine

## 2014-02-17 DIAGNOSIS — E1165 Type 2 diabetes mellitus with hyperglycemia: Secondary | ICD-10-CM | POA: Diagnosis not present

## 2014-02-17 LAB — GLUCOSE, CAPILLARY
Glucose-Capillary: 234 mg/dL — ABNORMAL HIGH (ref 70–99)
Glucose-Capillary: 282 mg/dL — ABNORMAL HIGH (ref 70–99)

## 2014-02-17 MED ORDER — INSULIN ASPART 100 UNIT/ML FLEXPEN: 8.0000 [IU] | PEN_INJECTOR | Freq: Three times a day (TID) | SUBCUTANEOUS | Status: DC

## 2014-02-17 MED ORDER — INSULIN GLARGINE 100 UNIT/ML SOLOSTAR PEN: 60.0000 [IU] | PEN_INJECTOR | Freq: Every day | SUBCUTANEOUS | Status: DC

## 2014-02-17 MED ORDER — ACCU-CHEK NANO SMARTVIEW W/DEVICE KIT: PACK | Status: DC

## 2014-02-17 NOTE — Discharge Instructions (Addendum)
Please check your blood sugars before each meal as directed.  Take LANTUS 60 units at night. Take NOVOLOG 8 units with meals.  Please record your blood sugars and bring them to your follow up appointments.   Diabetes Mellitus and Food It is important for you to manage your blood sugar (glucose) level. Your blood glucose level can be greatly affected by what you eat. Eating healthier foods in the appropriate amounts throughout the day at about the same time each day will help you control your blood glucose level. It can also help slow or prevent worsening of your diabetes mellitus. Healthy eating may even help you improve the level of your blood pressure and reach or maintain a healthy weight.  HOW CAN FOOD AFFECT ME? Carbohydrates Carbohydrates affect your blood glucose level more than any other type of food. Your dietitian will help you determine how many carbohydrates to eat at each meal and teach you how to count carbohydrates. Counting carbohydrates is important to keep your blood glucose at a healthy level, especially if you are using insulin or taking certain medicines for diabetes mellitus. Alcohol Alcohol can cause sudden decreases in blood glucose (hypoglycemia), especially if you use insulin or take certain medicines for diabetes mellitus. Hypoglycemia can be a life-threatening condition. Symptoms of hypoglycemia (sleepiness, dizziness, and disorientation) are similar to symptoms of having too much alcohol.  If your health care provider has given you approval to drink alcohol, do so in moderation and use the following guidelines:  Women should not have more than one drink per day, and men should not have more than two drinks per day. One drink is equal to:  12 oz of beer.  5 oz of wine.  1 oz of hard liquor.  Do not drink on an empty stomach.  Keep yourself hydrated. Have water, diet soda, or unsweetened iced tea.  Regular soda, juice, and other mixers might contain a lot of  carbohydrates and should be counted. WHAT FOODS ARE NOT RECOMMENDED? As you make food choices, it is important to remember that all foods are not the same. Some foods have fewer nutrients per serving than other foods, even though they might have the same number of calories or carbohydrates. It is difficult to get your body what it needs when you eat foods with fewer nutrients. Examples of foods that you should avoid that are high in calories and carbohydrates but low in nutrients include:  Trans fats (most processed foods list trans fats on the Nutrition Facts label).  Regular soda.  Juice.  Candy.  Sweets, such as cake, pie, doughnuts, and cookies.  Fried foods. WHAT FOODS CAN I EAT? Have nutrient-rich foods, which will nourish your body and keep you healthy. The food you should eat also will depend on several factors, including:  The calories you need.  The medicines you take.  Your weight.  Your blood glucose level.  Your blood pressure level.  Your cholesterol level. You also should eat a variety of foods, including:  Protein, such as meat, poultry, fish, tofu, nuts, and seeds (lean animal proteins are best).  Fruits.  Vegetables.  Dairy products, such as milk, cheese, and yogurt (low fat is best).  Breads, grains, pasta, cereal, rice, and beans.  Fats such as olive oil, trans fat-free margarine, canola oil, avocado, and olives. DOES EVERYONE WITH DIABETES MELLITUS HAVE THE SAME MEAL PLAN? Because every person with diabetes mellitus is different, there is not one meal plan that works for everyone. It  is very important that you meet with a dietitian who will help you create a meal plan that is just right for you. Document Released: 12/20/2004 Document Revised: 03/30/2013 Document Reviewed: 02/19/2013 [facility] Patient Information 2015 [place], [place]. This information is not intended to replace advice given to you by your health care provider. Make sure you discuss any  questions you have with your health care provider.

## 2014-02-17 NOTE — Progress Notes (Signed)
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 CM asked him does he have any support at home, patient stated " I'm a loner and I like it that way." Patient needs outpatient diabetic education ongoing, lots of encouragement given but I am not sure how much of this information the patient is retaining. Patient constantly talking on his cell phone during the entire visit; Cm will continue to follow up. Mindi Slicker RN,BSN,MHA [phone]

## 2014-02-17 NOTE — Discharge Summary (Signed)
Name: Keith [name] MRN: [id number] DOB: January 04, 1966 48 y.o. PCP: Rushie Nyhan, MD  Date of Admission: 02/14/2014  2:55 PM Date of Discharge: 02/17/2014 Attending Physician: Annia Belt, MD  Discharge Diagnosis: Active Problems:   Hyperglycemia   HIV disease   Hyperglycemia without ketosis  Discharge Medications:   Medication List    STOP taking these medications        glipiZIDE 5 MG 24 hr tablet  Commonly known as:  GLUCOTROL XL     metFORMIN 500 MG tablet  Commonly known as:  GLUCOPHAGE      TAKE these medications        ACCU-CHEK NANO SMARTVIEW W/DEVICE Kit  Please check blood sugar daily and with meals     ANDROGEL 50 MG/5GM (1%) Gel  Generic drug:  testosterone  Place 5 g onto the skin daily.     aspirin 81 MG EC tablet  Take 81 mg by mouth daily.     dapsone 100 MG tablet  Take 100 mg by mouth daily.     Darunavir Ethanolate 800 MG tablet  Commonly known as:  PREZISTA  Take 800 mg by mouth daily with breakfast.     emtricitabine-tenofovir 200-300 MG per tablet  Commonly known as:  TRUVADA  Take 1 tablet by mouth daily.     insulin aspart 100 UNIT/ML FlexPen  Commonly known as:  NOVOLOG FLEXPEN  Inject 8 Units into the skin 3 (three) times daily with meals.     Insulin Glargine 100 UNIT/ML Solostar Pen  Commonly known as:  LANTUS SOLOSTAR  Inject 60 Units into the skin daily at 10 pm.     Insulin Pen Needle 31G X 5 MM Misc  1 application by Does not apply route once. diag code 250.02. Insulin dependent. Used for insulin inj 1 time daily     Oxycodone HCl 10 MG Tabs  Take 10 mg by mouth 3 (three) times daily.     pravastatin 40 MG tablet  Commonly known as:  PRAVACHOL  Take 1 tablet (40 mg total) by mouth daily.     raltegravir 400 MG tablet  Commonly known as:  ISENTRESS  Take 400 mg by mouth 2 (two) times daily.     ritonavir 100 MG Tabs tablet  Commonly known as:  NORVIR  Take 100 mg by mouth daily with breakfast.          Disposition and follow-up:   KeithAristide A [name] was discharged from [facility] in Stable condition.  At the hospital follow up visit please address:  1.  Hyperglycemia and diabetes management: His metformin and glipizide were discontinued. He was discharged on Lantus 60u QHS and Novolog 8u TID with meals. Inpatient diabetes education visited him for education on this regimen as well as discussion on appropriate diet.  2.  Labs / imaging needed at time of follow-up: None  3.  Pending labs/ test needing follow-up: None  Follow-up Appointments:     Follow-up Information    Follow up with Keith Hey, MD On 02/18/2014.   Specialty:  Family Medicine   Why:  10 AM for hospital follow up   Contact information:   [address] [place] [id number] [phone]       Discharge Instructions: Discharge Instructions    Ambulatory referral to Nutrition and Diabetic Education    Complete by:  As directed      Call MD for:  difficulty breathing, headache or  visual disturbances    Complete by:  As directed      Call MD for:  persistant dizziness or light-headedness    Complete by:  As directed      Call MD for:  persistant nausea and vomiting    Complete by:  As directed      Call MD for:  temperature >100.4    Complete by:  As directed      Diet Carb Modified    Complete by:  As directed      Increase activity slowly    Complete by:  As directed            Consultations: None  Procedures Performed:  Dg Chest 2 View  01/23/2014   CLINICAL DATA:  Hyperglycemia. Shortness of breath and cough for 1 week. Initial encounter  EXAM: CHEST  2 VIEW  COMPARISON:  11/09/2013  FINDINGS: Normal heart size. Chronic mild aortic tortuosity. Negative hila. There is no edema, consolidation, effusion, or pneumothorax.  IMPRESSION: No active cardiopulmonary disease.   Electronically Signed   By: Jorje Guild M.D.   On: 01/23/2014 02:33   Dg Knee Complete  4 Views Right  01/23/2014   CLINICAL DATA:  48 year old male with pain and swelling right knee. History of diabetes. No history of trauma provided. Initial encounter.  EXAM: RIGHT KNEE - COMPLETE 4+ VIEW  COMPARISON:  09/03/2012.  FINDINGS: No fracture or dislocation.  Minimal patellofemoral joint degenerative changes.  No plain film evidence of joint effusion.  IMPRESSION: No fracture or dislocation.  Minimal patellofemoral joint degenerative changes.  No plain film evidence of joint effusion.   Electronically Signed   By: Chauncey Cruel M.D.   On: 01/23/2014 02:33    Admission HPI: Mr. [name] is a 48 year old with HIV, DM2, HTN presenting with hyperglycemia. His blood sugars have been increasing since yesterday. He reports they were in the 200s prior and yesterday was 400. Last night and this morning the blood sugar was too high for the machine to read. He reports compliance of his diabetes medications but states that he takes 60u of insulin daily, metformin twice a day and glipizide once a day. He reports he eats about one solid meal a day - his last meal was fish yesterday for lunch. Denies fried foods or breading on the fish. He reports he drinks mostly Sprite Zero and occasionally some sweet tea. He reports blurry vision, polyuria and polydipsia.  He reports subjective fevers and chills, one episode of emesis yesterday and one episode of diarrhea this morning. No sick contacts or recent antibiotic use. Denies nausea presently. Denies cough, shortness of breath, chest pain, abdominal pain, dysuria, hematuria, rash, bleeding/bruising, myalgias, weakness.  He has had multiple ED visits for hyperglycemia and exacerbation of chronic right knee pain. He was last admitted 02/04/2014 - 02/05/2014. His glucose was >600, 746 on BMP with an anion gap of 16 and no ketones in his urine. He was started on an insulin drip and his anion gap and glucose improved. He was transitioned to lantus 40u, novolog 6u TID and SSI.  On discharge, his home regimen was restarted.   Hospital Course by problem list: Active Problems:   Hyperglycemia   HIV disease   Hyperglycemia without ketosis   1. Hyperglycemia: Last Hemoglobin A1c 14.9 on 02/04/2014. On Lantus 60u qhs, glipizide $RemoveBefore'5mg'BKPaOhXpTquuk$  daily, and Metformin $RemoveBefor'500mg'CYmarjApRUPK$  BID at home. Blood glucose >600, 733 on BMP at admission. Blood glucose  came down to 443 after 30u Novolog  and 2L IVF. Anion gap was 17, no ketones in urine, so DKA was unlikely. Precipitating/predisposing factor is likely inadequate insulin treatment or noncompliance. It was discovered that he had his prescription for Lantus filled on 01/03/2009 but did not pick up his medication until 01/16/2014. He had no signs of infection - afebrile in ED with no leukocytosis. No signs or symptoms of acute illnesses such as CVA, MI or acute pancreatitis. On recheck of BMP his anion gap decreased to 16 and glucose was 350. He was admitted to internal medicine teaching service and continued on normal saline at 142ml/hr. He received 25 units of Lantus and was placed on sensitive sliding scale novolog insulin. Blood glucose overnight were 218-387. BMP with glucose 268 and anion gap 13, otherwise within normal limits. His IV fluids were discontinued and he received 25 units in the morning. He insulin regimen was titrated to 60 units Lantus QHS and Novolog 8 units TID. He was seen by inpatient diabetes coordinator who provided education on this regimen as well as appropriate diet. He was discharged on this regimen and his oral diabetes medications were discontinued.  2. Vomiting, diarrhea: No further episodes while hospitalized.  3. Chronic knee pain: Remained stable. Observed patient ambulating to around hallway without minimal difficulty. He only took two doses of ibuprofen, one on hospital day 1 and one on hospital day 2. He will follow up with his primary care physician, Dr. [name] for further management.  4. HIV: Followed by Dr. [name].  Last seen in Feb, CD4 120, VL<20 on 06/02/2013 and supposed to f/u in 13mo. On HAART: Prestiza, Truvada, Isentress, Norvir, and Dapsone for PCP prophylaxis due to Sulfa allergy. CD4 150 and VL 45 on 02/08/2014. He was continued on Prezista $RemoveBef'800mg'IkafhWLxfe$  QHS, Truvada 200-$RemoveBeforeDE'300mg'pUggOLKkRUsXAcw$  WHS, Isentress $RemoveBefore'400mg'BaZfRIfvUQnft$  BID, Norvir $RemoveBef'100mg'DhvSILCGRg$  QHS, and Dapsone $RemoveBef'100mg'pIaPLxOFgP$  daily.  Discharge Vitals:   BP 129/84 mmHg  Pulse 61  Temp(Src) 97.9 F (36.6 C) (Oral)  Resp 20  Ht $R'5\' 9"'vi$  (1.753 m)  Wt 269 lb 2.9 oz (122.1 kg)  BMI 39.73 kg/m2  SpO2 100%  Discharge Labs:  Results for orders placed or performed during the hospital encounter of 02/14/14 (from the past 24 hour(s))  Glucose, capillary     Status: Abnormal   Collection Time: 02/16/14  4:13 PM  Result Value Ref Range   Glucose-Capillary 232 (H) 70 - 99 mg/dL   Comment 1 Notify RN    Comment 2 Documented in Chart   Glucose, capillary     Status: Abnormal   Collection Time: 02/16/14  9:16 PM  Result Value Ref Range   Glucose-Capillary 327 (H) 70 - 99 mg/dL   Comment 1 Documented in Chart    Comment 2 Notify RN   Glucose, capillary     Status: Abnormal   Collection Time: 02/17/14  6:44 AM  Result Value Ref Range   Glucose-Capillary 234 (H) 70 - 99 mg/dL   Comment 1 Documented in Chart    Comment 2 Notify RN   Glucose, capillary     Status: Abnormal   Collection Time: 02/17/14 11:29 AM  Result Value Ref Range   Glucose-Capillary 282 (H) 70 - 99 mg/dL    Signed: Jacques Earthly, MD 02/17/2014, 11:37 AM    Services Ordered on Discharge: None Equipment Ordered on Discharge: None

## 2014-02-17 NOTE — Progress Notes (Signed)
Subjective: No acute events overnight. No new complaints this morning.  Objective: Vital signs in last 24 hours: Filed Vitals:   02/16/14 1702 02/16/14 2114 02/17/14 0200 02/17/14 0547  BP: 136/86 115/93 113/66 129/84  Pulse: 63 69 65 61  Temp: 97.8 F (36.6 C) 98.1 F (36.7 C) 97.8 F (36.6 C) 97.9 F (36.6 C)  TempSrc: Oral Oral Oral Oral  Resp: 16 20 18 20   Height:      Weight:      SpO2: 99% 100% 100% 100%   Weight change:   Intake/Output Summary (Last 24 hours) at 02/17/14 1132 Last data filed at 02/16/14 1327  Gross per 24 hour  Intake    480 ml  Output      0 ml  Net    480 ml   General Apperance: NAD Head: Normocephalic, atraumatic Eyes: PERRL, EOMI, anicteric sclera Ears: Normal external ear canal Nose: Nares normal, septum midline, mucosa normal Throat: Lips, mucosa and tongue normal  Neck: Supple, trachea midline Lungs: Clear to auscultation bilaterally. No wheezes, rhonchi or rales. Breathing comfortably on room air Heart: Regular rate and rhythm, no murmur/rub/gallop Abdomen: Soft, nontender, nondistended, no rebound/guarding Extremities: Normal, atraumatic, warm and well perfused, no edema.  Pulses: 2+ throughout Neurologic: Alert and oriented x 3. Normal strength and sensation  Lab Results: Basic Metabolic Panel:  Recent Labs Lab 02/15/14 0823 02/16/14 0648  NA 137 134*  K 3.8 3.9  CL 100 98  CO2 24 23  GLUCOSE 268* 355*  BUN 11 10  CREATININE 0.65 0.66  CALCIUM 8.7 8.7   Liver Function Tests:  Recent Labs Lab 02/14/14 1336  AST 22  ALT 16  ALKPHOS 123*  BILITOT 0.3  PROT 8.3  ALBUMIN 4.0   CBC:  Recent Labs Lab 02/14/14 1336  WBC 7.2  NEUTROABS 5.1  HGB 13.9  HCT 41.5  MCV 80.9  PLT 232   CBG:  Recent Labs Lab 02/16/14 0135 02/16/14 0659 02/16/14 1132 02/16/14 1613 02/16/14 2116 02/17/14 0644  GLUCAP 400* 332* 168* 232* 327* 234*   Urinalysis:  Recent Labs Lab 02/14/14 1505  COLORURINE YELLOW    LABSPEC 1.033*  PHURINE 6.0  GLUCOSEU >1000*  HGBUR NEGATIVE  BILIRUBINUR NEGATIVE  KETONESUR NEGATIVE  PROTEINUR NEGATIVE  UROBILINOGEN 0.2  NITRITE NEGATIVE  LEUKOCYTESUR NEGATIVE    Micro Results: Recent Results (from the past 240 hour(s))  MRSA PCR Screening     Status: Abnormal   Collection Time: 02/14/14  8:13 PM  Result Value Ref Range Status   MRSA by PCR POSITIVE (A) NEGATIVE Final    Comment:        The GeneXpert MRSA Assay (FDA approved for NASAL specimens only), is one component of a comprehensive MRSA colonization surveillance program. It is not intended to diagnose MRSA infection nor to guide or monitor treatment for MRSA infections. RESULT CALLED TO, READ BACK BY AND VERIFIED WITH: D.HOLLOWAY,RN 0114 02/15/14 M.CAMPBELL    Studies/Results: No results found.   Medications: I have reviewed the patient's current medications. Scheduled Meds: . aspirin EC  81 mg Oral Daily  . dapsone  100 mg Oral Daily  . Darunavir Ethanolate  800 mg Oral Q breakfast  . emtricitabine-tenofovir  1 tablet Oral Daily  . enoxaparin (LOVENOX) injection  60 mg Subcutaneous Q24H  . insulin aspart  0-5 Units Subcutaneous QHS  . insulin aspart  0-9 Units Subcutaneous TID WC  . insulin aspart  8 Units Subcutaneous TID WC  . insulin  glargine  60 Units Subcutaneous QHS  . mupirocin ointment   Nasal BID  . pravastatin  40 mg Oral q1800  . raltegravir  400 mg Oral BID  . ritonavir  100 mg Oral Q breakfast  . sodium chloride  1,000 mL Intravenous Once  . testosterone  5 g Transdermal Daily   Continuous Infusions:   PRN Meds:.acetaminophen, capsicum, ibuprofen Assessment/Plan: Active Problems:   Hyperglycemia   HIV disease   Hyperglycemia without ketosis  Hyperglycemia: Blood glucoses 168 to 327 over last 24 hours. -Continue lantus to 60u QHS -Continue Novolog to 8u TID -Will discharge on insulin only. Discontinue metformin and glipizide.  Chronic knee pain: stable.  Observed patient ambulating to around hallway without difficulty. He only took one dose of ibuprofen yesterday. Emphasized need for him to continue the ibuprofen to attain better pain control.  -Ibuprofen prn pain -Tylenol prn pain -Capsicum cream prn  HIV:  -Continuing Prezista 800mg  QHS, Truvada 200-300mg  WHS, Isentress 400mg  BID, Norvir 100mg  QHS -Continuing Dapsone 100mg  daily  HLD:  -continue pravastatin 40mg  daily  ED:  -Continuing testosterone gel daily   HTN:  -continue ASA 81mg  daily  FEN: -Heart healthy/carb modified  DVT PPx: lovenox daily  Dispo: Home today.  The patient does have a current PCP [name], MD) and does not need an [facility] hospital follow-up appointment after discharge.  The patient does not have transportation limitations that hinder transportation to clinic appointments.  .Services Needed at time of discharge: Y = Yes, Blank = No PT:   OT:   RN:   Equipment:   Other:     LOS: 3 days   Jacques Earthly, MD 02/17/2014, 11:32 AM

## 2014-02-17 NOTE — Progress Notes (Signed)
Pt is being discharged home. Discharge instructions were given to patient

## 2014-02-21 ENCOUNTER — Telehealth: Payer: Self-pay | Admitting: *Deleted

## 2014-02-21 MED ORDER — INSULIN LISPRO 100 UNIT/ML (KWIKPEN): 8.0000 [IU] | PEN_INJECTOR | Freq: Three times a day (TID) | SUBCUTANEOUS | Status: DC

## 2014-02-21 NOTE — Telephone Encounter (Signed)
Received PA request from pt's pharmacy for his Novolog flexpen, which was prescribed to him on discharge from teaching service.  Contacted pt's insurance at [phone] to obtain formulary alternatives. Humalog Claiborne Rigg is the preferred. Of note, pt is no longer an [facility] patient, but he was on our service in the hospital. Will send to prescribing MD for review and medication change, please advise.Despina Hidden Cassady11/16/20159:39 AM       [phone] Pt ID# [id number]  Length of call 49mins

## 2014-02-21 NOTE — Telephone Encounter (Signed)
That is fine to switch him to Humalog Kwikpen. Will put in order.

## 2014-02-22 ENCOUNTER — Ambulatory Visit (INDEPENDENT_AMBULATORY_CARE_PROVIDER_SITE_OTHER): Payer: Medicare Other | Admitting: Pulmonary Disease

## 2014-02-22 ENCOUNTER — Encounter: Payer: Self-pay | Admitting: Pulmonary Disease

## 2014-02-22 VITALS — BP 108/92 | HR 91 | Temp 98.2°F | Wt 268.9 lb

## 2014-02-22 DIAGNOSIS — N521 Erectile dysfunction due to diseases classified elsewhere: Secondary | ICD-10-CM

## 2014-02-22 DIAGNOSIS — A630 Anogenital (venereal) warts: Secondary | ICD-10-CM

## 2014-02-22 DIAGNOSIS — G8929 Other chronic pain: Secondary | ICD-10-CM

## 2014-02-22 DIAGNOSIS — E1159 Type 2 diabetes mellitus with other circulatory complications: Secondary | ICD-10-CM

## 2014-02-22 LAB — GLUCOSE, CAPILLARY: Glucose-Capillary: 462 mg/dL — ABNORMAL HIGH (ref 70–99)

## 2014-02-22 MED ORDER — PODOFILOX 0.5 % EX GEL: Freq: Two times a day (BID) | CUTANEOUS | Status: AC

## 2014-02-22 NOTE — Patient Instructions (Addendum)
General Instructions:   Please bring your medicines with you each time you come to clinic.  Medicines may include prescription medications, over-the-counter medications, herbal remedies, eye drops, vitamins, or other pills.   Progress Toward Treatment Goals:  Treatment Goal 02/22/2014  Hemoglobin A1C unable to assess  Blood pressure at goal    Self Care Goals & Plans:  Self Care Goal 02/22/2014  Manage my medications take my medicines as prescribed; bring my medications to every visit; refill my medications on time  Monitor my health keep track of my blood glucose; bring my glucose meter and log to each visit  Eat healthy foods eat baked foods instead of fried foods; eat foods that are low in salt; drink diet soda or water instead of juice or soda; eat more vegetables  Be physically active find an activity I enjoy    Home Blood Glucose Monitoring 02/22/2014  Check my blood sugar 3 times a day     Care Management & Community Referrals:  Referral 02/22/2014  Referrals made for care management support diabetes educator; [facility] case management program

## 2014-02-22 NOTE — Assessment & Plan Note (Signed)
Assessment: Single, small lesion with appearance consistent with condylomata acuminata.  Plan:  -Rx for Condylox 0.5% gel applied to lesion BID for three days. Wait four days for resolution. May reapply at that point. -Follow up with ID physicians as he has a history of HIV

## 2014-02-22 NOTE — Assessment & Plan Note (Signed)
Lab Results  Component Value Date   HGBA1C 14.9 02/04/2014   HGBA1C 5.6 05/04/2013   HGBA1C 10.2 12/18/2012     Assessment: Diabetes control: poor control (HgbA1C >9%) Comments: Patient has not started short acting insulin (humalog 8 units TID). Reports compliance of Lantus 60 units QHS.  Foot exam completed today - decreased sensation in bilateral feet, no lesions identified.  Plan: Medications:  continue current medications including Lantus 60 units QHS and Humalog 8 units TID Home glucose monitoring: Frequency: 3 times a day Instruction/counseling given: reminded to bring blood glucose meter & log to each visit, reminded to bring medications to each visit and discussed foot care Other plans:   -Referral to Diabetes Education -Referral to social work to establish [facility] -Urine microalbumin

## 2014-02-22 NOTE — Progress Notes (Signed)
Internal Medicine Clinic Attending  I saw and evaluated the patient.  I personally confirmed the key portions of the history and exam documented by Dr. Krall and I reviewed pertinent patient test results.  The assessment, diagnosis, and plan were formulated together and I agree with the documentation in the resident's note.  

## 2014-02-22 NOTE — Telephone Encounter (Signed)
Opened in Error.

## 2014-02-22 NOTE — Telephone Encounter (Signed)
Pharmacy contacted to d/c he Novolog rx.  Attempted to contact pt regarding medication change-no answer, unable to leave message.Keith [name], Keith Paul Cassady11/17/20159:02 AM

## 2014-02-22 NOTE — Progress Notes (Signed)
Subjective:    Patient ID: [name], male    DOB: 1965-07-17, 48 y.o.   MRN: [id number]  HPI [name] is a 48 year old man with history of HIV, DM2, HTN here for hospital follow up. He was hospitalized 02/14/2014 to 02/17/2014 for hyperglycemia. He was discharged on 60 units Lantus QHS and Novolog 8 units TID. His oral diabetes medications were discontinued.  Since discharge, he has been feeling better overall. He has been taking Lantus 60 units QHS. Has not picked up Humalog pens yet. Blood sugars were up to 300 yesterday. It has gone as low as 180. Reports fatigue. He has polyuria and polydipsia. No lightheadedness, dizziness, tremulousness.  Followed up with Dr. [name], but was not happy with his care there.  He would like a referral to a pain clinic.  Review of Systems  Constitutional: Negative for fever and chills.  HENT: Negative for sore throat.   Eyes: Negative for pain and visual disturbance.  Respiratory: Negative for shortness of breath.   Cardiovascular: Negative for chest pain.  Gastrointestinal: Positive for diarrhea (Loose). Negative for nausea and abdominal pain.  Endocrine: Positive for polydipsia and polyuria.  Genitourinary: Negative for dysuria and discharge.  Musculoskeletal: Positive for arthralgias (chronic).  Neurological: Negative for dizziness and light-headedness.   Reports he has a wart on his penis.  Past Medical History  Diagnosis Date  . HIV (human immunodeficiency virus infection) 2009    CD4 count 100, VL 13800 (05/01/2010)  . Diabetes type 2, uncontrolled     HgA1c 17.6 (04/27/2010)  . Hypertension   . Genital warts   . Erectile dysfunction   . Chronic knee pain     right  . Osteomyelitis     h/o hand  . Diabetes mellitus   . Chronic pain   . AIDS    Current Outpatient Prescriptions on File Prior to Visit  Medication Sig Dispense Refill  . aspirin 81 MG EC tablet Take 81 mg by mouth daily.     . Blood Glucose Monitoring  Suppl (ACCU-CHEK NANO SMARTVIEW) W/DEVICE KIT Please check blood sugar daily and with meals 1 kit 0  . dapsone 100 MG tablet Take 100 mg by mouth daily.    . Darunavir Ethanolate (PREZISTA) 800 MG tablet Take 800 mg by mouth daily with breakfast.     . emtricitabine-tenofovir (TRUVADA) 200-300 MG per tablet Take 1 tablet by mouth daily.     . Insulin Glargine (LANTUS SOLOSTAR) 100 UNIT/ML Solostar Pen Inject 60 Units into the skin daily at 10 pm. 15 mL 0  . insulin lispro (HUMALOG) 100 UNIT/ML KiwkPen Inject 0.08 mLs (8 Units total) into the skin 3 (three) times daily. 15 mL 0  . Insulin Pen Needle 31G X 5 MM MISC 1 application by Does not apply route once. diag code 250.02. Insulin dependent. Used for insulin inj 1 time daily 100 each 6  . Oxycodone HCl 10 MG TABS Take 10 mg by mouth 3 (three) times daily.    . pravastatin (PRAVACHOL) 40 MG tablet Take 1 tablet (40 mg total) by mouth daily. 30 tablet 11  . raltegravir (ISENTRESS) 400 MG tablet Take 400 mg by mouth 2 (two) times daily.    . ritonavir (NORVIR) 100 MG TABS tablet Take 100 mg by mouth daily with breakfast.     . testosterone (ANDROGEL) 50 MG/5GM GEL Place 5 g onto the skin daily.      No current facility-administered medications on file prior to  visit.   Today's Vitals   02/22/14 1419  BP: 108/92  Pulse: 91  Temp: 98.2 F (36.8 C)  TempSrc: Oral  Weight: 268 lb 14.4 oz (121.972 kg)  SpO2: 100%   Objective:  Physical Exam  Constitutional: He is oriented to person, place, and time. He appears well-developed and well-nourished. No distress.  HENT:  Head: Normocephalic and atraumatic.  Eyes: EOM are normal.  Neck: Neck supple.  Cardiovascular: Normal rate and regular rhythm.   Pulmonary/Chest: Effort normal and breath sounds normal. He has no wheezes.  Abdominal: Soft. There is no tenderness.  Genitourinary:    No penile tenderness.  Musculoskeletal: Normal range of motion.  Neurological: He is alert and oriented to  person, place, and time.  Psychiatric: He has a normal mood and affect.     Assessment & Plan:  Please refer to problem based charting.

## 2014-02-22 NOTE — Assessment & Plan Note (Signed)
Assessment: chronic right knee pain. XR 01/23/2014 of right knee with no fracture or dislocation, minimal patellofemoral joint degenerative changes.  Plan: -Referral sent for pain clinic

## 2014-02-23 LAB — MICROALBUMIN / CREATININE URINE RATIO
Creatinine, Urine: 69.1 mg/dL
Microalb Creat Ratio: 26 mg/g (ref 0.0–30.0)
Microalb, Ur: 1.8 mg/dL (ref <2.0)

## 2014-02-28 ENCOUNTER — Emergency Department (HOSPITAL_COMMUNITY)
Admission: EM | Admit: 2014-02-28 | Discharge: 2014-02-28 | Disposition: A | Discharge status: Home / Self Care | Payer: Medicare Other | Attending: Emergency Medicine | Admitting: Emergency Medicine

## 2014-02-28 ENCOUNTER — Encounter (HOSPITAL_COMMUNITY): Payer: Self-pay | Admitting: Emergency Medicine

## 2014-02-28 ENCOUNTER — Other Ambulatory Visit: Payer: Self-pay | Admitting: Licensed Clinical Social Worker

## 2014-02-28 ENCOUNTER — Telehealth: Payer: Self-pay | Admitting: *Deleted

## 2014-02-28 DIAGNOSIS — IMO0002 Reserved for concepts with insufficient information to code with codable children: Secondary | ICD-10-CM

## 2014-02-28 DIAGNOSIS — E860 Dehydration: Secondary | ICD-10-CM

## 2014-02-28 DIAGNOSIS — Z21 Asymptomatic human immunodeficiency virus [HIV] infection status: Secondary | ICD-10-CM

## 2014-02-28 DIAGNOSIS — Z794 Long term (current) use of insulin: Secondary | ICD-10-CM | POA: Insufficient documentation

## 2014-02-28 DIAGNOSIS — Z7982 Long term (current) use of aspirin: Secondary | ICD-10-CM | POA: Diagnosis not present

## 2014-02-28 DIAGNOSIS — E1159 Type 2 diabetes mellitus with other circulatory complications: Secondary | ICD-10-CM

## 2014-02-28 DIAGNOSIS — N521 Erectile dysfunction due to diseases classified elsewhere: Principal | ICD-10-CM

## 2014-02-28 DIAGNOSIS — B20 Human immunodeficiency virus [HIV] disease: Secondary | ICD-10-CM | POA: Insufficient documentation

## 2014-02-28 DIAGNOSIS — E1165 Type 2 diabetes mellitus with hyperglycemia: Secondary | ICD-10-CM | POA: Insufficient documentation

## 2014-02-28 DIAGNOSIS — G8929 Other chronic pain: Secondary | ICD-10-CM | POA: Diagnosis not present

## 2014-02-28 DIAGNOSIS — Z8739 Personal history of other diseases of the musculoskeletal system and connective tissue: Secondary | ICD-10-CM | POA: Insufficient documentation

## 2014-02-28 DIAGNOSIS — I10 Essential (primary) hypertension: Secondary | ICD-10-CM | POA: Insufficient documentation

## 2014-02-28 DIAGNOSIS — R739 Hyperglycemia, unspecified: Secondary | ICD-10-CM

## 2014-02-28 DIAGNOSIS — Z87438 Personal history of other diseases of male genital organs: Secondary | ICD-10-CM | POA: Insufficient documentation

## 2014-02-28 LAB — COMPREHENSIVE METABOLIC PANEL
ALT: 15 U/L (ref 0–53)
AST: 17 U/L (ref 0–37)
Albumin: 4 g/dL (ref 3.5–5.2)
Alkaline Phosphatase: 96 U/L (ref 39–117)
Anion gap: 13 (ref 5–15)
BUN: 12 mg/dL (ref 6–23)
CO2: 25 mEq/L (ref 19–32)
Calcium: 9.7 mg/dL (ref 8.4–10.5)
Chloride: 94 mEq/L — ABNORMAL LOW (ref 96–112)
Creatinine, Ser: 0.71 mg/dL (ref 0.50–1.35)
GFR calc Af Amer: >90 mL/min (ref >90)
GFR calc non Af Amer: >90 mL/min (ref >90)
Glucose, Bld: 467 mg/dL — ABNORMAL HIGH (ref 70–99)
Potassium: 4.4 mEq/L (ref 3.7–5.3)
Sodium: 132 mEq/L — ABNORMAL LOW (ref 137–147)
Total Bilirubin: 0.2 mg/dL — ABNORMAL LOW (ref 0.3–1.2)
Total Protein: 8.3 g/dL (ref 6.0–8.3)

## 2014-02-28 LAB — URINE MICROSCOPIC-ADD ON

## 2014-02-28 LAB — CBC WITH DIFFERENTIAL/PLATELET
Basophils Absolute: 0 10*3/uL (ref 0.0–0.1)
Basophils Relative: 0 % (ref 0–1)
Eosinophils Absolute: 0 10*3/uL (ref 0.0–0.7)
Eosinophils Relative: 1 % (ref 0–5)
HCT: 39.4 % (ref 39.0–52.0)
Hemoglobin: 12.9 g/dL — ABNORMAL LOW (ref 13.0–17.0)
Lymphocytes Relative: 34 % (ref 12–46)
Lymphs Abs: 1.6 10*3/uL (ref 0.7–4.0)
MCH: 26.2 pg (ref 26.0–34.0)
MCHC: 32.7 g/dL (ref 30.0–36.0)
MCV: 79.9 fL (ref 78.0–100.0)
Monocytes Absolute: 0.2 10*3/uL (ref 0.1–1.0)
Monocytes Relative: 5 % (ref 3–12)
Neutro Abs: 2.8 10*3/uL (ref 1.7–7.7)
Neutrophils Relative %: 60 % (ref 43–77)
Platelets: 250 10*3/uL (ref 150–400)
RBC: 4.93 MIL/uL (ref 4.22–5.81)
RDW: 13.4 % (ref 11.5–15.5)
WBC: 4.7 10*3/uL (ref 4.0–10.5)

## 2014-02-28 LAB — URINALYSIS, ROUTINE W REFLEX MICROSCOPIC
Bilirubin Urine: NEGATIVE
Glucose, UA: >1000 mg/dL — AB
Hgb urine dipstick: NEGATIVE
Ketones, ur: NEGATIVE mg/dL
Leukocytes, UA: NEGATIVE
Nitrite: NEGATIVE
Protein, ur: NEGATIVE mg/dL
Specific Gravity, Urine: 1.036 — ABNORMAL HIGH (ref 1.005–1.030)
Urobilinogen, UA: 0.2 mg/dL (ref 0.0–1.0)
pH: 5.5 (ref 5.0–8.0)

## 2014-02-28 LAB — CBG MONITORING, ED
Glucose-Capillary: 475 mg/dL — ABNORMAL HIGH (ref 70–99)
Glucose-Capillary: 427 mg/dL — ABNORMAL HIGH (ref 70–99)
Glucose-Capillary: 372 mg/dL — ABNORMAL HIGH (ref 70–99)

## 2014-02-28 MED ORDER — FENTANYL CITRATE 0.05 MG/ML IJ SOLN
50.0000 ug | Freq: Once | INTRAMUSCULAR | Status: AC
  Administered 2014-02-28: 50 ug via INTRAVENOUS
  Filled 2014-02-28: qty 2

## 2014-02-28 MED ORDER — SODIUM CHLORIDE 0.9 % IV BOLUS (SEPSIS)
1000.0000 mL | Freq: Once | INTRAVENOUS | Status: AC
  Administered 2014-02-28: 1000 mL via INTRAVENOUS

## 2014-02-28 NOTE — ED Notes (Signed)
Pt states he took his blood sugar this am around 10 and it was 400. Pt states he always has high sugars. Pt states he feels weak.

## 2014-02-28 NOTE — ED Notes (Signed)
CBG 427..RN notified

## 2014-02-28 NOTE — ED Notes (Signed)
CBG = 372.  RN and PA-C informed.

## 2014-02-28 NOTE — ED Notes (Signed)
Pt now complaining of leg pain. PA at bedside. Will give pain medicine

## 2014-02-28 NOTE — Discharge Instructions (Signed)
Hyperglycemia °Hyperglycemia occurs when the glucose (sugar) in your blood is too high. Hyperglycemia can happen for many reasons, but it most often happens to people who do not know they have diabetes or are not managing their diabetes properly.  °CAUSES  °Whether you have diabetes or not, there are other causes of hyperglycemia. Hyperglycemia can occur when you have diabetes, but it can also occur in other situations that you might not be as aware of, such as: °Diabetes °· If you have diabetes and are having problems controlling your blood glucose, hyperglycemia could occur because of some of the following reasons: °· Not following your meal plan. °· Not taking your diabetes medications or not taking it properly. °· Exercising less or doing less activity than you normally do. °· Being sick. °Pre-diabetes °· This cannot be ignored. Before people develop Type 2 diabetes, they almost always have "pre-diabetes." This is when your blood glucose levels are higher than normal, but not yet high enough to be diagnosed as diabetes. Research has shown that some long-term damage to the body, especially the heart and circulatory system, may already be occurring during pre-diabetes. If you take action to manage your blood glucose when you have pre-diabetes, you may delay or prevent Type 2 diabetes from developing. °Stress °· If you have diabetes, you may be "diet" controlled or on oral medications or insulin to control your diabetes. However, you may find that your blood glucose is higher than usual in the hospital whether you have diabetes or not. This is often referred to as "stress hyperglycemia." Stress can elevate your blood glucose. This happens because of hormones put out by the body during times of stress. If stress has been the cause of your high blood glucose, it can be followed regularly by your caregiver. That way he/she can make sure your hyperglycemia does not continue to get worse or progress to  diabetes. °Steroids °· Steroids are medications that act on the infection fighting system (immune system) to block inflammation or infection. One side effect can be a rise in blood glucose. Most people can produce enough extra insulin to allow for this rise, but for those who cannot, steroids make blood glucose levels go even higher. It is not unusual for steroid treatments to "uncover" diabetes that is developing. It is not always possible to determine if the hyperglycemia will go away after the steroids are stopped. A special blood test called an A1c is sometimes done to determine if your blood glucose was elevated before the steroids were started. °SYMPTOMS °· Thirsty. °· Frequent urination. °· Dry mouth. °· Blurred vision. °· Tired or fatigue. °· Weakness. °· Sleepy. °· Tingling in feet or leg. °DIAGNOSIS  °Diagnosis is made by monitoring blood glucose in one or all of the following ways: °· A1c test. This is a chemical found in your blood. °· Fingerstick blood glucose monitoring. °· Laboratory results. °TREATMENT  °First, knowing the cause of the hyperglycemia is important before the hyperglycemia can be treated. Treatment may include, but is not be limited to: °· Education. °· Change or adjustment in medications. °· Change or adjustment in meal plan. °· Treatment for an illness, infection, etc. °· More frequent blood glucose monitoring. °· Change in exercise plan. °· Decreasing or stopping steroids. °· Lifestyle changes. °HOME CARE INSTRUCTIONS  °· Test your blood glucose as directed. °· Exercise regularly. Your caregiver will give you instructions about exercise. Pre-diabetes or diabetes which comes on with stress is helped by exercising. °· Eat wholesome,   balanced meals. Eat often and at regular, fixed times. Your caregiver or nutritionist will give you a meal plan to guide your sugar intake.  Being at an ideal weight is important. If needed, losing as little as 10 to 15 pounds may help improve blood  glucose levels. SEEK MEDICAL CARE IF:   You have questions about medicine, activity, or diet.  You continue to have symptoms (problems such as increased thirst, urination, or weight gain). SEEK IMMEDIATE MEDICAL CARE IF:   You are vomiting or have diarrhea.  Your breath smells fruity.  You are breathing faster or slower.  You are very sleepy or incoherent.  You have numbness, tingling, or pain in your feet or hands.  You have chest pain.  Your symptoms get worse even though you have been following your caregiver's orders.  If you have any other questions or concerns. Document Released: 09/18/2000 Document Revised: 06/17/2011 Document Reviewed: 07/22/2011 [facility] Patient Information 2015 [place], [place]. This information is not intended to replace advice given to you by your health care provider. Make sure you discuss any questions you have with your health care provider.  Dehydration, Adult Dehydration is when you lose more fluids from the body than you take in. Vital organs like the kidneys, brain, and heart cannot function without a proper amount of fluids and salt. Any loss of fluids from the body can cause dehydration.  CAUSES   Vomiting.  Diarrhea.  Excessive sweating.  Excessive urine output.  Fever. SYMPTOMS  Mild dehydration  Thirst.  Dry lips.  Slightly dry mouth. Moderate dehydration  Very dry mouth.  Sunken eyes.  Skin does not bounce back quickly when lightly pinched and released.  Dark urine and decreased urine production.  Decreased tear production.  Headache. Severe dehydration  Very dry mouth.  Extreme thirst.  Rapid, weak pulse (more than 100 beats per minute at rest).  Cold hands and feet.  Not able to sweat in spite of heat and temperature.  Rapid breathing.  Blue lips.  Confusion and lethargy.  Difficulty being awakened.  Minimal urine production.  No tears. DIAGNOSIS  Your caregiver will diagnose dehydration  based on your symptoms and your exam. Blood and urine tests will help confirm the diagnosis. The diagnostic evaluation should also identify the cause of dehydration. TREATMENT  Treatment of mild or moderate dehydration can often be done at home by increasing the amount of fluids that you drink. It is best to drink small amounts of fluid more often. Drinking too much at one time can make vomiting worse. Refer to the home care instructions below. Severe dehydration needs to be treated at the hospital where you will probably be given intravenous (IV) fluids that contain water and electrolytes. HOME CARE INSTRUCTIONS   Ask your caregiver about specific rehydration instructions.  Drink enough fluids to keep your urine clear or pale yellow.  Drink small amounts frequently if you have nausea and vomiting.  Eat as you normally do.  Avoid:  Foods or drinks high in sugar.  Carbonated drinks.  Juice.  Extremely hot or cold fluids.  Drinks with caffeine.  Fatty, greasy foods.  Alcohol.  Tobacco.  Overeating.  Gelatin desserts.  Wash your hands well to avoid spreading bacteria and viruses.  Only take over-the-counter or prescription medicines for pain, discomfort, or fever as directed by your caregiver.  Ask your caregiver if you should continue all prescribed and over-the-counter medicines.  Keep all follow-up appointments with your caregiver. SEEK MEDICAL CARE IF:  You have abdominal pain and it increases or stays in one area (localizes).  You have a rash, stiff neck, or severe headache.  You are irritable, sleepy, or difficult to awaken.  You are weak, dizzy, or extremely thirsty. SEEK IMMEDIATE MEDICAL CARE IF:   You are unable to keep fluids down or you get worse despite treatment.  You have frequent episodes of vomiting or diarrhea.  You have blood or green matter (bile) in your vomit.  You have blood in your stool or your stool looks black and tarry.  You have  not urinated in 6 to 8 hours, or you have only urinated a small amount of very dark urine.  You have a fever.  You faint. MAKE SURE YOU:   Understand these instructions.  Will watch your condition.  Will get help right away if you are not doing well or get worse. Document Released: 03/25/2005 Document Revised: 06/17/2011 Document Reviewed: 11/12/2010 [facility] Patient Information 2015 [place], [place]. This information is not intended to replace advice given to you by your health care provider. Make sure you discuss any questions you have with your health care provider.

## 2014-02-28 NOTE — Telephone Encounter (Signed)
Also needs reill Podofilox 0.5% soln - this is generic for Conylox. Hilda Blades Genice Kimberlin RN 02/28/14 11:43AM

## 2014-02-28 NOTE — ED Provider Notes (Signed)
CSN: [id number]     Arrival date & time 02/28/14  1346 History   First MD Initiated Contact with Patient 02/28/14 1459     Chief Complaint  Patient presents with  . Hyperglycemia   HPI  Patient is a 48 year old male with past medical history of HIV with a CD4 count of 150 on 02/08/2014, diabetes mellitus type 2 uncontrolled, hypertension, and chronic pain, who presents to the emergency room for hyperglycemia. Patient states that over the past couple days he has noticed that his blood sugars have been running very high. He states that his sugars generally run around 125. Patient states that for the past 2 days they have been around 300.  Patient states that he has been taking his insulin as prescribed. His last dose of insulin was 1 hour prior to coming in here. Patient states that he feels fine, but does feel like he is very weak. Upon chart review patient has 2 admissions on 02/04/2014, 02/14/2014 for hyperglycemia.  Patient was started by the internal medicine clinic on Humalog on most recent visit on 02/22/2014. Patient states that he is complying with this medication.  Past Medical History  Diagnosis Date  . HIV (human immunodeficiency virus infection) 2009    CD4 count 100, VL 13800 (05/01/2010)  . Diabetes type 2, uncontrolled     HgA1c 17.6 (04/27/2010)  . Hypertension   . Genital warts   . Erectile dysfunction   . Chronic knee pain     right  . Osteomyelitis     h/o hand  . Diabetes mellitus   . Chronic pain   . AIDS    Past Surgical History  Procedure Laterality Date  . Multiple extractions with alveoloplasty N/A 01/18/2013    Procedure: MULTIPLE EXTRACION 3, 6, 7, 10, 11, 13, 21, 22, 27, 28, 29, 30 WITH ALVEOLOPLASTY;  Surgeon: Gae Bon, DDS;  Location: [facility];  Service: Oral Surgery;  Laterality: N/A;  . Hernia repair     Family History  Problem Relation Age of Onset  . Hypertension Mother   . Arthritis Father   . Hypertension Father   . Hypertension Brother    . Cancer Maternal Grandmother 68    unknown type of cancer  . Depression Paternal Grandmother    History  Substance Use Topics  . Smoking status: Never Smoker   . Smokeless tobacco: Never Used  . Alcohol Use: No    Review of Systems  Constitutional: Positive for fatigue. Negative for fever and chills.  Respiratory: Negative for chest tightness, shortness of breath and wheezing.   Cardiovascular: Negative for chest pain, palpitations and leg swelling.  Gastrointestinal: Negative for nausea, vomiting, abdominal pain, diarrhea, constipation and blood in stool.  Genitourinary: Negative for dysuria, urgency, frequency, hematuria, decreased urine volume and difficulty urinating.  Skin: Negative for rash.  Neurological: Positive for weakness. Negative for dizziness, tremors, seizures, syncope, light-headedness and headaches.  All other systems reviewed and are negative.     Allergies  Sulfa antibiotics  Home Medications   Prior to Admission medications   Medication Sig Start Date End Date Taking? Authorizing Provider  aspirin 81 MG EC tablet Take 81 mg by mouth daily.  08/20/11  Yes [name], MD  Blood Glucose Monitoring Suppl (ACCU-CHEK NANO SMARTVIEW) W/DEVICE KIT Please check blood sugar daily and with meals 02/17/14  Yes Jacques Earthly, MD  dapsone 100 MG tablet Take 100 mg by mouth daily.   Yes Historical Provider, MD  Darunavir Ethanolate (  PREZISTA) 800 MG tablet Take 800 mg by mouth daily with breakfast.    Yes Historical Provider, MD  emtricitabine-tenofovir (TRUVADA) 200-300 MG per tablet Take 1 tablet by mouth daily.    Yes Historical Provider, MD  Insulin Glargine (LANTUS SOLOSTAR) 100 UNIT/ML Solostar Pen Inject 60 Units into the skin daily at 10 pm. 02/17/14  Yes Jacques Earthly, MD  insulin lispro (HUMALOG) 100 UNIT/ML KiwkPen Inject 0.08 mLs (8 Units total) into the skin 3 (three) times daily. 02/21/14  Yes Jacques Earthly, MD  Insulin Pen Needle 31G X 5 MM MISC  1 application by Does not apply route once. diag code 250.02. Insulin dependent. Used for insulin inj 1 time daily 11/23/12  Yes Clinton Gallant, MD  Oxycodone HCl 10 MG TABS Take 10 mg by mouth 3 (three) times daily.   Yes Historical Provider, MD  pravastatin (PRAVACHOL) 40 MG tablet Take 1 tablet (40 mg total) by mouth daily. 01/03/14  Yes Karren Cobble, MD  raltegravir (ISENTRESS) 400 MG tablet Take 400 mg by mouth 2 (two) times daily.   Yes Historical Provider, MD  ritonavir (NORVIR) 100 MG TABS tablet Take 100 mg by mouth daily with breakfast.    Yes Historical Provider, MD  testosterone (ANDROGEL) 50 MG/5GM GEL Place 5 g onto the skin daily.    Yes Historical Provider, MD   BP 137/89 mmHg  Pulse 66  Temp(Src) 98.2 F (36.8 C) (Oral)  Resp 14  Ht _0  (1.778 m)  Wt 260 lb (117.935 kg)  BMI 37.31 kg/m2  SpO2 99% Physical Exam  Constitutional: He is oriented to person, place, and time. He appears well-developed and well-nourished. No distress.  HENT:  Head: Normocephalic and atraumatic.  Mouth/Throat: No oropharyngeal exudate.  Mucous membranes are dry, patient is edentulous.  Eyes: Conjunctivae and EOM are normal. Pupils are equal, round, and reactive to light. No scleral icterus.  Neck: Normal range of motion. Neck supple. No JVD present. No thyromegaly present.  Cardiovascular: Normal rate, regular rhythm, normal heart sounds and intact distal pulses.  Exam reveals no gallop and no friction rub.   No murmur heard. Pulmonary/Chest: Effort normal and breath sounds normal. No respiratory distress. He has no wheezes. He has no rales. He exhibits no tenderness.  Abdominal: Soft. Bowel sounds are normal. He exhibits no distension and no mass. There is no tenderness. There is no rebound and no guarding.  Musculoskeletal: Normal range of motion.  Lymphadenopathy:    He has no cervical adenopathy.  Neurological: He is alert and oriented to person, place, and time. He has normal strength. No  cranial nerve deficit or sensory deficit. Coordination normal. GCS eye subscore is 4. GCS verbal subscore is 5. GCS motor subscore is 6.  Skin: Skin is warm and dry. He is not diaphoretic.  Psychiatric: He has a normal mood and affect. His behavior is normal. Judgment and thought content normal.  Nursing note and vitals reviewed.   ED Course  Procedures (including critical care time) Labs Review Labs Reviewed  CBC WITH DIFFERENTIAL - Abnormal; Notable for the following:    Hemoglobin 12.9 (*)    All other components within normal limits  COMPREHENSIVE METABOLIC PANEL - Abnormal; Notable for the following:    Sodium 132 (*)    Chloride 94 (*)    Glucose, Bld 467 (*)    Total Bilirubin 0.2 (*)    All other components within normal limits  URINALYSIS, ROUTINE W REFLEX MICROSCOPIC - Abnormal; Notable for  the following:    Specific Gravity, Urine 1.036 (*)    Glucose, UA >1000 (*)    All other components within normal limits  CBG MONITORING, ED - Abnormal; Notable for the following:    Glucose-Capillary 475 (*)    All other components within normal limits  CBG MONITORING, ED - Abnormal; Notable for the following:    Glucose-Capillary 427 (*)    All other components within normal limits  CBG MONITORING, ED - Abnormal; Notable for the following:    Glucose-Capillary 372 (*)    All other components within normal limits  URINE MICROSCOPIC-ADD ON    Imaging Review No results found.   EKG Interpretation None      MDM   Final diagnoses:  Hyperglycemia without ketosis  Diabetes type 2, uncontrolled  HIV (human immunodeficiency virus infection)  Dehydration   Patient is a 48 year old male who presents to the emergency room for hyperglycemia. Physical exam reveals alert and oriented male, with GCS of 15, and signs of dehydration. CBG on arrival was 475. CBC unremarkable. CMP reveals hyponatremia and low chloremia. There is no anion gap. UA reveals no ketones. Patient given 1 L  normal saline bolus here in the ED. I have not given insulin at this time as patient states he just had 8 units prior to arriving. Repeat CBG after 1 L normal saline bolus reveals CBG of 370. Patient states that he feels significantly better. We'll finish bolus at this time. Patient to be discharged home. Patient is to follow-up with his PCP with internal medicine for further diabetes care. Patient to return for nausea, vomiting, decreased urine, or any other concerning symptoms. I have discussed the importance of taking medications as they are prescribed. Patient states understanding and agreement with the above plan. Patient is stable for discharge at this time. I have discussed this patient and the patient was seen by Dr. Vanita Panda who agrees with the above plan and workup.    Cherylann Parr, PA-C 02/28/14 [place], MD 03/01/14 0010

## 2014-02-28 NOTE — ED Notes (Signed)
Pt presents with high blood sugar at home for the past 3 days, admits to blurred vision at times.  Pt reports home meters have been reading in 300's.  Denies pain or other complaints.

## 2014-02-28 NOTE — Telephone Encounter (Signed)
[facility] is requesting refill on Condylox 0.5% gel 3.5Gm - last refill 02/22/14 Jone Panebianco RN 02/28/14 11:30AM

## 2014-03-10 ENCOUNTER — Encounter: Payer: Self-pay | Admitting: Internal Medicine

## 2014-03-10 ENCOUNTER — Ambulatory Visit (INDEPENDENT_AMBULATORY_CARE_PROVIDER_SITE_OTHER): Payer: Medicare Other | Admitting: Internal Medicine

## 2014-03-10 VITALS — BP 141/68 | HR 108 | Temp 98.8°F | Wt 269.7 lb

## 2014-03-10 DIAGNOSIS — N521 Erectile dysfunction due to diseases classified elsewhere: Secondary | ICD-10-CM

## 2014-03-10 DIAGNOSIS — B20 Human immunodeficiency virus [HIV] disease: Secondary | ICD-10-CM

## 2014-03-10 DIAGNOSIS — G8929 Other chronic pain: Secondary | ICD-10-CM

## 2014-03-10 DIAGNOSIS — E1159 Type 2 diabetes mellitus with other circulatory complications: Secondary | ICD-10-CM

## 2014-03-10 LAB — GLUCOSE, CAPILLARY: Glucose-Capillary: 377 mg/dL — ABNORMAL HIGH (ref 70–99)

## 2014-03-10 NOTE — Assessment & Plan Note (Signed)
Lab Results  Component Value Date   HGBA1C 14.9 02/04/2014   HGBA1C 5.6 05/04/2013   HGBA1C 10.2 12/18/2012     Assessment: Diabetes control: poor control (HgbA1C >9%) Progress toward A1C goal:  unchanged Comments: Patient states he has been compliant w/ his insulin but presents today w/ CBG of 372. Was recently discharged from hospital w/ Lantus 60 units daily + Novolog 8 units tid with meals. Patient did not bring his meter today.   Plan: Medications:  continue current medications (see above) Home glucose monitoring: Frequency: once a day Timing: before breakfast Instruction/counseling given: discussed diet; patient states he has been eating lots of sweets and drinking sodas.  Other plans: Patient to return to clinic in 6-8 weeks.  -Will also have patient see Debera Lat as well, will very much benefit from nutrition and diabetes education.

## 2014-03-10 NOTE — Assessment & Plan Note (Signed)
Most recent CD4 count 150, VL 45 as of 02/08/14. Currently taking Norvir, Isentress, Truvada, and Prezista and claims that he has been complaint with this regimen. Has not seen Dr. [name] in quite some time. -Instructed follow up at [facility] in the next 1-2 months.

## 2014-03-10 NOTE — Patient Instructions (Signed)
General Instructions:  1. Please schedule a follow up appointment for 6-8 weeks.   2. Please take all medications as prescribed. Continue Lantus 60 units daily (long acting insulin) + Novolog 8 units three times daily with meals (short acting insulin).   We will schedule you for an appointment with Debera Lat, our diabetes educator.  Please see Dr. Noah Delaine for further pain medication.  3. If you have worsening of your symptoms or new symptoms arise, please call the clinic [phone]), or go to the ER immediately if symptoms are severe.    Please bring your medicines with you each time you come to clinic.  Medicines may include prescription medications, over-the-counter medications, herbal remedies, eye drops, vitamins, or other pills.   Progress Toward Treatment Goals:  Treatment Goal 03/10/2014  Hemoglobin A1C unchanged  Blood pressure at goal    Self Care Goals & Plans:  Self Care Goal 03/10/2014  Manage my medications take my medicines as prescribed; bring my medications to every visit; refill my medications on time  Monitor my health keep track of my blood glucose; bring my glucose meter and log to each visit  Eat healthy foods -  Be physically active -  Meeting treatment goals maintain the current self-care plan    Home Blood Glucose Monitoring 03/10/2014  Check my blood sugar once a day  When to check my blood sugar before breakfast     Care Management & Community Referrals:  Referral 03/10/2014  Referrals made for care management support diabetes educator  Referrals made to community resources none

## 2014-03-10 NOTE — Assessment & Plan Note (Addendum)
Patient presented to clinic today requesting fill of his narcotic pain medication. Was last seen in [facility] clinic on 02/22/14 and was referred to a pain management clinic. However, the patient has a different PCP listed on his Medicaid card. Told patient that I would not fill his prescription today as there have been previous caution notifications that the patient has previously acquired narcotic prescription from multiple sources in the past. He became upset during our visit insisting that he needed medication to control his chronic pain. On exam, the patient was not in obvious distress, examination of knees and back w/ minimal tenderness to palpation.  -Search of the narcotics database revealed recent prescription refill on 02/21/14 for Oxycodone 10 mg #90 from a different provider (Dr. Noah Delaine), who has been providing the patient w/ prescriptions -Informed the patient that we will not be providing him w/ narcotic pain medications in the future and that he will need proper referral to a pain management clinic.

## 2014-03-10 NOTE — Progress Notes (Signed)
Subjective:   [name] male   DOB: 05-Aug-1965 48 y.o.   MRN: [id number]  HPI: Keith [name] is a 48 y.o. male w/ PMHx of HIV (CD4 150, VL 37 as of 02/08/14), HTN, condyloma acuminata, and uncontrolled DM type II (HbA1c 14.9 on 02/04/14), presents to the clinic today for a follow-up visit. Keith [name] was recently discharged from the hospital on 02/17/14 for hyperglycemia related to non-compliance w/ his insulin. Social worker following the patient during his admission said the patient had not picked up his insulin from the pharmacy for 5 years. Since his recent discharge, he was seen in the clinic on 02/22/14 and was shown to have a CBG of 462, still had not picked up his Novolog insulin from the pharmacy. The patient then again was seen in the ED on 02/28/14 for hyperglycemia and had CBG of 475, given IVF and sent home. The patient is supposed to be using Lantus 60 units and Novolog 8 units tid. Metformin and Glipizide were discontinued during his recent hospital stay.  Today the patient states that his CBG's have been better controlled at home, however, CBG in clinic is 372. States he has in fact been using both his Lantus and Novolog Insulin and explains how he does this and the appropriate times. States he has been eating sweets and drinking sodas frequently. Does admit to some recently blurry vision and mild malaise.  Patient also requesting Oxycodone 10 mg tid prn for chronic pain. Previously attempted to send patient to pain management clinic, however, he had a different provider listed on his Medicaid card. Patient has multiple previous controlled medication caution warnings stating that he has received narcotics from multiple different sources in the past. Narcotics database search reveals that the patient has received Oxycodone 10 mg #90 from a different provider as recently as 02/21/14.   Past Medical History  Diagnosis Date  . HIV (human immunodeficiency virus infection)  2009    CD4 count 100, VL 13800 (05/01/2010)  . Diabetes type 2, uncontrolled     HgA1c 17.6 (04/27/2010)  . Hypertension   . Genital warts   . Erectile dysfunction   . Chronic knee pain     right  . Osteomyelitis     h/o hand  . Diabetes mellitus   . Chronic pain   . AIDS    Current Outpatient Prescriptions  Medication Sig Dispense Refill  . aspirin 81 MG EC tablet Take 81 mg by mouth daily.     . Blood Glucose Monitoring Suppl (ACCU-CHEK NANO SMARTVIEW) W/DEVICE KIT Please check blood sugar daily and with meals 1 kit 0  . dapsone 100 MG tablet Take 100 mg by mouth daily.    . Darunavir Ethanolate (PREZISTA) 800 MG tablet Take 800 mg by mouth daily with breakfast.     . emtricitabine-tenofovir (TRUVADA) 200-300 MG per tablet Take 1 tablet by mouth daily.     . Insulin Glargine (LANTUS SOLOSTAR) 100 UNIT/ML Solostar Pen Inject 60 Units into the skin daily at 10 pm. 15 mL 0  . insulin lispro (HUMALOG) 100 UNIT/ML KiwkPen Inject 0.08 mLs (8 Units total) into the skin 3 (three) times daily. 15 mL 0  . Insulin Pen Needle 31G X 5 MM MISC 1 application by Does not apply route once. diag code 250.02. Insulin dependent. Used for insulin inj 1 time daily 100 each 6  . Oxycodone HCl 10 MG TABS Take 10 mg by mouth 3 (three) times  daily.    . pravastatin (PRAVACHOL) 40 MG tablet Take 1 tablet (40 mg total) by mouth daily. 30 tablet 11  . raltegravir (ISENTRESS) 400 MG tablet Take 400 mg by mouth 2 (two) times daily.    . ritonavir (NORVIR) 100 MG TABS tablet Take 100 mg by mouth daily with breakfast.     . testosterone (ANDROGEL) 50 MG/5GM GEL Place 5 g onto the skin daily.      No current facility-administered medications for this visit.    Review of Systems: General: Denies fever, chills, diaphoresis, appetite change and fatigue.  Respiratory: Denies SOB, DOE, cough, and wheezing.   Cardiovascular: Denies chest pain and palpitations.  Gastrointestinal: Denies nausea, vomiting, abdominal  pain, and diarrhea.  Genitourinary: Denies dysuria, increased frequency, and flank pain. Endocrine: Positive for blurry vision. Denies hot or cold intolerance, polyuria, and polydipsia. Musculoskeletal: Positive for back pain and knee pain. Denies myalgias, joint swelling, and gait problem.  Skin: Denies pallor, rash and wounds.  Neurological: Denies dizziness, seizures, syncope, weakness, lightheadedness, numbness and headaches.  Psychiatric/Behavioral: Denies mood changes, and sleep disturbances.  Objective:   Physical Exam: Filed Vitals:   03/10/14 1530  BP: 141/68  Pulse: 108  Temp: 98.8 F (37.1 C)  TempSrc: Oral  Weight: 269 lb 11.2 oz (122.335 kg)  SpO2: 98%    General: AA male alert, cooperative, NAD. HEENT: PERRL, EOMI. Moist mucus membranes Neck: Full range of motion without pain, supple, no lymphadenopathy or carotid bruits Lungs: Clear to ascultation bilaterally, normal work of respiration, no wheezes, rales, rhonchi Heart: RRR, no murmurs, gallops, or rubs Abdomen: Soft, non-tender, non-distended, BS + Extremities: No cyanosis, clubbing, or edema Neurologic: Alert & oriented X3, cranial nerves II-XII intact, strength grossly intact, sensation intact to light touch   Assessment & Plan:   Please see problem based assessment and plan.

## 2014-03-11 NOTE — Progress Notes (Signed)
Internal Medicine Clinic Attending  Case discussed with Dr. Jones soon after the resident saw the patient.  We reviewed the resident's history and exam and pertinent patient test results.  I agree with the assessment, diagnosis, and plan of care documented in the resident's note. 

## 2014-03-15 NOTE — Telephone Encounter (Signed)
Dr Ronnald Ramp saw pt on 03/10/14 in clinic.

## 2014-03-26 ENCOUNTER — Other Ambulatory Visit: Payer: Self-pay | Admitting: Infectious Diseases

## 2014-03-26 DIAGNOSIS — B20 Human immunodeficiency virus [HIV] disease: Secondary | ICD-10-CM

## 2014-03-28 ENCOUNTER — Ambulatory Visit: Payer: Medicare Other | Admitting: Dietician

## 2014-03-28 ENCOUNTER — Ambulatory Visit: Payer: Medicare Other | Admitting: *Deleted

## 2014-04-01 ENCOUNTER — Emergency Department (HOSPITAL_COMMUNITY)
Admission: EM | Admit: 2014-04-01 | Discharge: 2014-04-01 | Disposition: A | Discharge status: Home / Self Care | Payer: Medicare Other | Attending: Emergency Medicine | Admitting: Emergency Medicine

## 2014-04-01 ENCOUNTER — Encounter (HOSPITAL_COMMUNITY): Payer: Self-pay

## 2014-04-01 DIAGNOSIS — Z8739 Personal history of other diseases of the musculoskeletal system and connective tissue: Secondary | ICD-10-CM | POA: Diagnosis not present

## 2014-04-01 DIAGNOSIS — G8929 Other chronic pain: Secondary | ICD-10-CM | POA: Insufficient documentation

## 2014-04-01 DIAGNOSIS — I10 Essential (primary) hypertension: Secondary | ICD-10-CM | POA: Diagnosis not present

## 2014-04-01 DIAGNOSIS — Z79899 Other long term (current) drug therapy: Secondary | ICD-10-CM | POA: Diagnosis not present

## 2014-04-01 DIAGNOSIS — B20 Human immunodeficiency virus [HIV] disease: Secondary | ICD-10-CM | POA: Insufficient documentation

## 2014-04-01 DIAGNOSIS — Z7982 Long term (current) use of aspirin: Secondary | ICD-10-CM | POA: Diagnosis not present

## 2014-04-01 DIAGNOSIS — E1165 Type 2 diabetes mellitus with hyperglycemia: Secondary | ICD-10-CM | POA: Diagnosis not present

## 2014-04-01 DIAGNOSIS — R000 Tachycardia, unspecified: Secondary | ICD-10-CM | POA: Diagnosis not present

## 2014-04-01 DIAGNOSIS — R739 Hyperglycemia, unspecified: Secondary | ICD-10-CM

## 2014-04-01 DIAGNOSIS — Z87448 Personal history of other diseases of urinary system: Secondary | ICD-10-CM | POA: Diagnosis not present

## 2014-04-01 DIAGNOSIS — Z794 Long term (current) use of insulin: Secondary | ICD-10-CM | POA: Diagnosis not present

## 2014-04-01 LAB — CBG MONITORING, ED
Glucose-Capillary: 519 mg/dL — ABNORMAL HIGH (ref 70–99)
Glucose-Capillary: 472 mg/dL — ABNORMAL HIGH (ref 70–99)
Glucose-Capillary: 466 mg/dL — ABNORMAL HIGH (ref 70–99)
Glucose-Capillary: 379 mg/dL — ABNORMAL HIGH (ref 70–99)
Glucose-Capillary: 403 mg/dL — ABNORMAL HIGH (ref 70–99)
Glucose-Capillary: 427 mg/dL — ABNORMAL HIGH (ref 70–99)

## 2014-04-01 LAB — URINALYSIS, ROUTINE W REFLEX MICROSCOPIC
Bilirubin Urine: NEGATIVE
Glucose, UA: >1000 mg/dL — AB
Hgb urine dipstick: NEGATIVE
Ketones, ur: 15 mg/dL — AB
Leukocytes, UA: NEGATIVE
Nitrite: NEGATIVE
Protein, ur: NEGATIVE mg/dL
Specific Gravity, Urine: 1.035 — ABNORMAL HIGH (ref 1.005–1.030)
Urobilinogen, UA: 0.2 mg/dL (ref 0.0–1.0)
pH: 5.5 (ref 5.0–8.0)

## 2014-04-01 LAB — COMPREHENSIVE METABOLIC PANEL
ALT: 15 U/L (ref 0–53)
AST: 26 U/L (ref 0–37)
Albumin: 4 g/dL (ref 3.5–5.2)
Alkaline Phosphatase: 109 U/L (ref 39–117)
Anion gap: 11 (ref 5–15)
BUN: 16 mg/dL (ref 6–23)
CO2: 28 mmol/L (ref 19–32)
Calcium: 9.7 mg/dL (ref 8.4–10.5)
Chloride: 90 mEq/L — ABNORMAL LOW (ref 96–112)
Creatinine, Ser: 1.11 mg/dL (ref 0.50–1.35)
GFR calc Af Amer: 88 mL/min — ABNORMAL LOW (ref >90)
GFR calc non Af Amer: 76 mL/min — ABNORMAL LOW (ref >90)
Glucose, Bld: 602 mg/dL (ref 70–99)
Potassium: 4.7 mmol/L (ref 3.5–5.1)
Sodium: 129 mmol/L — ABNORMAL LOW (ref 135–145)
Total Bilirubin: 0.4 mg/dL (ref 0.3–1.2)
Total Protein: >12 g/dL — ABNORMAL HIGH (ref 6.0–8.3)

## 2014-04-01 LAB — I-STAT CHEM 8, ED
BUN: 22 mg/dL (ref 6–23)
Calcium, Ion: 1.16 mmol/L (ref 1.12–1.23)
Chloride: 93 mEq/L — ABNORMAL LOW (ref 96–112)
Creatinine, Ser: 1 mg/dL (ref 0.50–1.35)
Glucose, Bld: 631 mg/dL (ref 70–99)
HCT: 49 % (ref 39.0–52.0)
Hemoglobin: 16.7 g/dL (ref 13.0–17.0)
Potassium: 4.7 mmol/L (ref 3.5–5.1)
Sodium: 130 mmol/L — ABNORMAL LOW (ref 135–145)
TCO2: 25 mmol/L (ref 0–100)

## 2014-04-01 LAB — CBC
HCT: 40.6 % (ref 39.0–52.0)
Hemoglobin: 14.1 g/dL (ref 13.0–17.0)
MCH: 27.1 pg (ref 26.0–34.0)
MCHC: 34.7 g/dL (ref 30.0–36.0)
MCV: 78.1 fL (ref 78.0–100.0)
Platelets: 188 10*3/uL (ref 150–400)
RBC: 5.2 MIL/uL (ref 4.22–5.81)
RDW: 12.5 % (ref 11.5–15.5)
WBC: 4.9 10*3/uL (ref 4.0–10.5)

## 2014-04-01 LAB — URINE MICROSCOPIC-ADD ON

## 2014-04-01 LAB — LIPASE, BLOOD: Lipase: 32 U/L (ref 11–59)

## 2014-04-01 LAB — I-STAT CG4 LACTIC ACID, ED: Lactic Acid, Venous: 1.37 mmol/L (ref 0.5–2.2)

## 2014-04-01 MED ORDER — SODIUM CHLORIDE 0.9 % IV BOLUS (SEPSIS)
1000.0000 mL | Freq: Once | INTRAVENOUS | Status: AC
  Administered 2014-04-01: 1000 mL via INTRAVENOUS

## 2014-04-01 MED ORDER — INSULIN ASPART 100 UNIT/ML ~~LOC~~ SOLN
10.0000 [IU] | Freq: Once | SUBCUTANEOUS | Status: AC
  Administered 2014-04-01: 10 [IU] via SUBCUTANEOUS
  Filled 2014-04-01: qty 1

## 2014-04-01 MED ORDER — SODIUM CHLORIDE 0.9 % IV SOLN
INTRAVENOUS | Status: DC
  Administered 2014-04-01: 4.1 [IU]/h via INTRAVENOUS
  Filled 2014-04-01: qty 2.5

## 2014-04-01 MED ORDER — SODIUM CHLORIDE 0.9 % IV BOLUS (SEPSIS): 1000.0000 mL | Freq: Once | INTRAVENOUS | Status: DC

## 2014-04-01 NOTE — ED Notes (Signed)
No change in insulin drip

## 2014-04-01 NOTE — ED Provider Notes (Signed)
CSN: [id number]     Arrival date & time 04/01/14  0076 History   First MD Initiated Contact with Patient 04/01/14 [phone]     Chief Complaint  Patient presents with  . Hyperglycemia     (Consider location/radiation/quality/duration/timing/severity/associated sxs/prior Treatment) HPI Comments:  48 year old male  With a past medical history of diabetes, chronic pain,  HIV and hypertension presented with hyperglycemia.  Patient reports he lost his blood sugar monitor a few days ago and has been unable to check his sugars. He states he feels like they are high despite taking his insulin as prescribed. He reports not feeling well , he's been experiencing nausea , vomiting and diarrhea. States he's had multiple episodes of nonbloody, nonbilious emesis. Denies any abdominal pain, fever, chills, chest pain or shortness of breath.  Patient is a 48 y.o. male presenting with hyperglycemia. The history is provided by the patient.  Hyperglycemia   Past Medical History  Diagnosis Date  . HIV (human immunodeficiency virus infection) 2009    CD4 count 100, VL 13800 (05/01/2010)  . Diabetes type 2, uncontrolled     HgA1c 17.6 (04/27/2010)  . Hypertension   . Genital warts   . Erectile dysfunction   . Chronic knee pain     right  . Osteomyelitis     h/o hand  . Diabetes mellitus   . Chronic pain   . AIDS    Past Surgical History  Procedure Laterality Date  . Multiple extractions with alveoloplasty N/A 01/18/2013    Procedure: MULTIPLE EXTRACION 3, 6, 7, 10, 11, 13, 21, 22, 27, 28, 29, 30 WITH ALVEOLOPLASTY;  Surgeon: Gae Bon, DDS;  Location: [facility];  Service: Oral Surgery;  Laterality: N/A;  . Hernia repair     Family History  Problem Relation Age of Onset  . Hypertension Mother   . Arthritis Father   . Hypertension Father   . Hypertension Brother   . Cancer Maternal Grandmother 46    unknown type of cancer  . Depression Paternal Grandmother    History  Substance Use Topics  .  Smoking status: Never Smoker   . Smokeless tobacco: Never Used  . Alcohol Use: No    Review of Systems  10 Systems reviewed and are negative for acute change except as noted in the HPI.  Allergies  Sulfa antibiotics  Home Medications   Prior to Admission medications   Medication Sig Start Date End Date Taking? Authorizing Provider  aspirin 81 MG EC tablet Take 81 mg by mouth daily.  08/20/11  Yes [name], MD  Blood Glucose Monitoring Suppl (ACCU-CHEK NANO SMARTVIEW) W/DEVICE KIT Please check blood sugar daily and with meals 02/17/14  Yes Jacques Earthly, MD  dapsone 100 MG tablet TAKE 1 TABLET BY MOUTH EVERY DAY 03/28/14  Yes [name], MD  Insulin Glargine (LANTUS SOLOSTAR) 100 UNIT/ML Solostar Pen Inject 60 Units into the skin daily at 10 pm. 02/17/14  Yes Jacques Earthly, MD  insulin lispro (HUMALOG) 100 UNIT/ML KiwkPen Inject 0.08 mLs (8 Units total) into the skin 3 (three) times daily. 02/21/14  Yes Jacques Earthly, MD  Insulin Pen Needle 31G X 5 MM MISC 1 application by Does not apply route once. diag code 250.02. Insulin dependent. Used for insulin inj 1 time daily 11/23/12  Yes Clinton Gallant, MD  ISENTRESS 400 MG tablet TAKE 1 TABLET BY MOUTH TWICE DAILY 03/28/14  Yes [name], MD  NORVIR 100 MG TABS tablet TAKE 1 TABLET  BY MOUTH EVERY DAY WITH BREAKFAST 03/28/14  Yes [name], MD  Oxycodone HCl 10 MG TABS Take 10 mg by mouth 3 (three) times daily.   Yes Historical Provider, MD  pravastatin (PRAVACHOL) 40 MG tablet Take 1 tablet (40 mg total) by mouth daily. 01/03/14  Yes Karren Cobble, MD  PREZISTA 800 MG tablet TAKE 1 TABLET BY MOUTH EVERY DAY WITH BREAKFAST 03/28/14  Yes [name], MD  testosterone (ANDROGEL) 50 MG/5GM GEL Place 5 g onto the skin daily.    Yes Historical Provider, MD  TRUVADA 200-300 MG per tablet TAKE 1 TABLET BY MOUTH DAILY 03/28/14  Yes [name], MD   BP 131/93 mmHg  Pulse 73  Temp(Src) 98.2 F (36.8 C)  (Oral)  Resp 20  SpO2 97% Physical Exam  Constitutional: He is oriented to person, place, and time. He appears well-developed and well-nourished. No distress.  HENT:  Head: Normocephalic and atraumatic.  Dry MM.  Eyes: Conjunctivae are normal.  Neck: Normal range of motion. Neck supple.  Cardiovascular: Regular rhythm and normal heart sounds.   Tachy ~100.  Pulmonary/Chest: Effort normal and breath sounds normal.  Abdominal: Soft. Bowel sounds are normal. There is no tenderness.  Musculoskeletal: Normal range of motion. He exhibits no edema.  Neurological: He is alert and oriented to person, place, and time.  Skin: Skin is warm and dry. He is not diaphoretic.  Psychiatric: He has a normal mood and affect. His behavior is normal.    ED Course  Procedures (including critical care time) Labs Review Labs Reviewed  COMPREHENSIVE METABOLIC PANEL - Abnormal; Notable for the following:    Sodium 129 (*)    Chloride 90 (*)    Glucose, Bld 602 (*)    Total Protein >12.0 (*)    GFR calc non Af Amer 76 (*)    GFR calc Af Amer 88 (*)    All other components within normal limits  URINALYSIS, ROUTINE W REFLEX MICROSCOPIC - Abnormal; Notable for the following:    Color, Urine STRAW (*)    Specific Gravity, Urine 1.035 (*)    Glucose, UA >1000 (*)    Ketones, ur 15 (*)    All other components within normal limits  CBG MONITORING, ED - Abnormal; Notable for the following:    Glucose-Capillary 519 (*)    All other components within normal limits  I-STAT CHEM 8, ED - Abnormal; Notable for the following:    Sodium 130 (*)    Chloride 93 (*)    Glucose, Bld 631 (*)    All other components within normal limits  CBG MONITORING, ED - Abnormal; Notable for the following:    Glucose-Capillary 472 (*)    All other components within normal limits  CBG MONITORING, ED - Abnormal; Notable for the following:    Glucose-Capillary 466 (*)    All other components within normal limits  CBG MONITORING,  ED - Abnormal; Notable for the following:    Glucose-Capillary 379 (*)    All other components within normal limits  CBG MONITORING, ED - Abnormal; Notable for the following:    Glucose-Capillary 403 (*)    All other components within normal limits  CBG MONITORING, ED - Abnormal; Notable for the following:    Glucose-Capillary 427 (*)    All other components within normal limits  CBC  LIPASE, BLOOD  URINE MICROSCOPIC-ADD ON  I-STAT CG4 LACTIC ACID, ED    Imaging Review No results found.  EKG Interpretation None      MDM   Final diagnoses:  Hyperglycemia   Patient in no apparent distress. Mild tachycardia  On arrival , vitals otherwise stable. Afebrile.  CBG on arrival noted to be 519. Labs obtained, sodium of 129, glucose of 602. No anion gap.  Patient started on insulin drip and given IV fluids. CBG started to decrease down to 379. He then ate a sandwich and his CBG went back up to 427. He is requesting to go home and states that he is feeling fine. He states he does not want to stay any longer in the emergency department. 10 units subcutaneous insulin given. Discussed importance of compliance with his insulin and other medications along with following up with his PCP. Vital signs remained stable, no longer tachycardic.  I feel patient is stable for discharge and does not need to be admitted into the hospital. Return precautions given. Patient states understanding of treatment care plan and is agreeable.  Discussed with attending Dr. Aline Brochure who also evaluated patient and agrees with plan of care.   Carman Ching, PA-C 04/01/14 [place], MD 04/01/14 [phone]

## 2014-04-01 NOTE — ED Notes (Signed)
Notified RN of CBG 466

## 2014-04-01 NOTE — ED Notes (Signed)
NOTIFIED [facility] ,[facility] FOR PATIENTS LAB RESULTS OF I-STAT CHEM 8+ =631mg /dl ,@09 :40 AM ,04/01/2014.

## 2014-04-01 NOTE — ED Notes (Signed)
Elevated Blood sugar.  Pt. Lost his CBG machine a few days ago and has not been able to check it.  Pt. Does not feel good.  Thirsty.  Denies any pain or sob  Pt. Reports having n/v/d

## 2014-04-01 NOTE — ED Notes (Signed)
Pt. Is ready to go home. Reported to Mayo Ao, PA

## 2014-04-01 NOTE — Discharge Instructions (Signed)
It is very important for you to follow up with your primary care doctor and to continue taking your insulin and other medications on a daily basis.  Hyperglycemia Hyperglycemia occurs when the glucose (sugar) in your blood is too high. Hyperglycemia can happen for many reasons, but it most often happens to people who do not know they have diabetes or are not managing their diabetes properly.  CAUSES  Whether you have diabetes or not, there are other causes of hyperglycemia. Hyperglycemia can occur when you have diabetes, but it can also occur in other situations that you might not be as aware of, such as: Diabetes  If you have diabetes and are having problems controlling your blood glucose, hyperglycemia could occur because of some of the following reasons:  Not following your meal plan.  Not taking your diabetes medications or not taking it properly.  Exercising less or doing less activity than you normally do.  Being sick. Pre-diabetes  This cannot be ignored. Before people develop Type 2 diabetes, they almost always have "pre-diabetes." This is when your blood glucose levels are higher than normal, but not yet high enough to be diagnosed as diabetes. Research has shown that some long-term damage to the body, especially the heart and circulatory system, may already be occurring during pre-diabetes. If you take action to manage your blood glucose when you have pre-diabetes, you may delay or prevent Type 2 diabetes from developing. Stress  If you have diabetes, you may be "diet" controlled or on oral medications or insulin to control your diabetes. However, you may find that your blood glucose is higher than usual in the hospital whether you have diabetes or not. This is often referred to as "stress hyperglycemia." Stress can elevate your blood glucose. This happens because of hormones put out by the body during times of stress. If stress has been the cause of your high blood glucose, it can be  followed regularly by your caregiver. That way he/she can make sure your hyperglycemia does not continue to get worse or progress to diabetes. Steroids  Steroids are medications that act on the infection fighting system (immune system) to block inflammation or infection. One side effect can be a rise in blood glucose. Most people can produce enough extra insulin to allow for this rise, but for those who cannot, steroids make blood glucose levels go even higher. It is not unusual for steroid treatments to "uncover" diabetes that is developing. It is not always possible to determine if the hyperglycemia will go away after the steroids are stopped. A special blood test called an A1c is sometimes done to determine if your blood glucose was elevated before the steroids were started. SYMPTOMS  Thirsty.  Frequent urination.  Dry mouth.  Blurred vision.  Tired or fatigue.  Weakness.  Sleepy.  Tingling in feet or leg. DIAGNOSIS  Diagnosis is made by monitoring blood glucose in one or all of the following ways:  A1c test. This is a chemical found in your blood.  Fingerstick blood glucose monitoring.  Laboratory results. TREATMENT  First, knowing the cause of the hyperglycemia is important before the hyperglycemia can be treated. Treatment may include, but is not be limited to:  Education.  Change or adjustment in medications.  Change or adjustment in meal plan.  Treatment for an illness, infection, etc.  More frequent blood glucose monitoring.  Change in exercise plan.  Decreasing or stopping steroids.  Lifestyle changes. HOME CARE INSTRUCTIONS   Test your blood glucose  as directed.  Exercise regularly. Your caregiver will give you instructions about exercise. Pre-diabetes or diabetes which comes on with stress is helped by exercising.  Eat wholesome, balanced meals. Eat often and at regular, fixed times. Your caregiver or nutritionist will give you a meal plan to guide  your sugar intake.  Being at an ideal weight is important. If needed, losing as little as 10 to 15 pounds may help improve blood glucose levels. SEEK MEDICAL CARE IF:   You have questions about medicine, activity, or diet.  You continue to have symptoms (problems such as increased thirst, urination, or weight gain). SEEK IMMEDIATE MEDICAL CARE IF:   You are vomiting or have diarrhea.  Your breath smells fruity.  You are breathing faster or slower.  You are very sleepy or incoherent.  You have numbness, tingling, or pain in your feet or hands.  You have chest pain.  Your symptoms get worse even though you have been following your caregiver's orders.  If you have any other questions or concerns. Document Released: 09/18/2000 Document Revised: 06/17/2011 Document Reviewed: 07/22/2011 [facility] Patient Information 2015 [place], [place]. This information is not intended to replace advice given to you by your health care provider. Make sure you discuss any questions you have with your health care provider.

## 2014-04-13 NOTE — Addendum Note (Signed)
Addended by: Hulan Fray on: 04/13/2014 06:20 PM   Modules accepted: Orders

## 2014-04-19 ENCOUNTER — Inpatient Hospital Stay (HOSPITAL_COMMUNITY)
Admission: EM | Admit: 2014-04-19 | Discharge: 2014-04-24 | DRG: 602 | Disposition: A | Discharge status: Home / Self Care | Payer: Medicare Other | Attending: Oncology | Admitting: Oncology

## 2014-04-19 ENCOUNTER — Encounter (HOSPITAL_COMMUNITY): Payer: Self-pay | Admitting: *Deleted

## 2014-04-19 ENCOUNTER — Emergency Department (HOSPITAL_COMMUNITY): Payer: Medicare Other

## 2014-04-19 DIAGNOSIS — Z794 Long term (current) use of insulin: Secondary | ICD-10-CM | POA: Diagnosis not present

## 2014-04-19 DIAGNOSIS — L0212 Furuncle of neck: Secondary | ICD-10-CM | POA: Diagnosis present

## 2014-04-19 DIAGNOSIS — E291 Testicular hypofunction: Secondary | ICD-10-CM | POA: Diagnosis present

## 2014-04-19 DIAGNOSIS — G8929 Other chronic pain: Secondary | ICD-10-CM | POA: Diagnosis present

## 2014-04-19 DIAGNOSIS — I10 Essential (primary) hypertension: Secondary | ICD-10-CM | POA: Diagnosis present

## 2014-04-19 DIAGNOSIS — E1165 Type 2 diabetes mellitus with hyperglycemia: Secondary | ICD-10-CM | POA: Diagnosis not present

## 2014-04-19 DIAGNOSIS — L0211 Cutaneous abscess of neck: Secondary | ICD-10-CM | POA: Diagnosis not present

## 2014-04-19 DIAGNOSIS — Z7982 Long term (current) use of aspirin: Secondary | ICD-10-CM | POA: Diagnosis not present

## 2014-04-19 DIAGNOSIS — L03221 Cellulitis of neck: Secondary | ICD-10-CM | POA: Diagnosis present

## 2014-04-19 DIAGNOSIS — R739 Hyperglycemia, unspecified: Secondary | ICD-10-CM

## 2014-04-19 DIAGNOSIS — Z79899 Other long term (current) drug therapy: Secondary | ICD-10-CM

## 2014-04-19 DIAGNOSIS — Z9114 Patient's other noncompliance with medication regimen: Secondary | ICD-10-CM | POA: Diagnosis present

## 2014-04-19 DIAGNOSIS — B20 Human immunodeficiency virus [HIV] disease: Secondary | ICD-10-CM | POA: Diagnosis present

## 2014-04-19 DIAGNOSIS — E349 Endocrine disorder, unspecified: Secondary | ICD-10-CM | POA: Diagnosis present

## 2014-04-19 DIAGNOSIS — E119 Type 2 diabetes mellitus without complications: Secondary | ICD-10-CM | POA: Diagnosis present

## 2014-04-19 DIAGNOSIS — Z22322 Carrier or suspected carrier of Methicillin resistant Staphylococcus aureus: Secondary | ICD-10-CM | POA: Diagnosis not present

## 2014-04-19 DIAGNOSIS — E785 Hyperlipidemia, unspecified: Secondary | ICD-10-CM | POA: Diagnosis present

## 2014-04-19 LAB — CBG MONITORING, ED: Glucose-Capillary: 462 mg/dL — ABNORMAL HIGH (ref 70–99)

## 2014-04-19 LAB — CBC WITH DIFFERENTIAL/PLATELET
Basophils Absolute: 0 10*3/uL (ref 0.0–0.1)
Basophils Relative: 0 % (ref 0–1)
Eosinophils Absolute: 0 10*3/uL (ref 0.0–0.7)
Eosinophils Relative: 0 % (ref 0–5)
HCT: 37.1 % — ABNORMAL LOW (ref 39.0–52.0)
Hemoglobin: 12.6 g/dL — ABNORMAL LOW (ref 13.0–17.0)
Lymphocytes Relative: 34 % (ref 12–46)
Lymphs Abs: 2.9 10*3/uL (ref 0.7–4.0)
MCH: 26.6 pg (ref 26.0–34.0)
MCHC: 34 g/dL (ref 30.0–36.0)
MCV: 78.4 fL (ref 78.0–100.0)
Monocytes Absolute: 0.6 10*3/uL (ref 0.1–1.0)
Monocytes Relative: 7 % (ref 3–12)
Neutro Abs: 5 10*3/uL (ref 1.7–7.7)
Neutrophils Relative %: 59 % (ref 43–77)
Platelets: 228 10*3/uL (ref 150–400)
RBC: 4.73 MIL/uL (ref 4.22–5.81)
RDW: 12.4 % (ref 11.5–15.5)
WBC: 8.5 10*3/uL (ref 4.0–10.5)

## 2014-04-19 LAB — I-STAT CHEM 8, ED
BUN: 9 mg/dL (ref 6–23)
Calcium, Ion: 1.27 mmol/L — ABNORMAL HIGH (ref 1.12–1.23)
Chloride: 94 mEq/L — ABNORMAL LOW (ref 96–112)
Creatinine, Ser: 0.8 mg/dL (ref 0.50–1.35)
Glucose, Bld: 618 mg/dL (ref 70–99)
HCT: 43 % (ref 39.0–52.0)
Hemoglobin: 14.6 g/dL (ref 13.0–17.0)
Potassium: 4.3 mmol/L (ref 3.5–5.1)
Sodium: 132 mmol/L — ABNORMAL LOW (ref 135–145)
TCO2: 23 mmol/L (ref 0–100)

## 2014-04-19 LAB — BASIC METABOLIC PANEL
Anion gap: 10 (ref 5–15)
BUN: 7 mg/dL (ref 6–23)
CO2: 25 mmol/L (ref 19–32)
Calcium: 9.6 mg/dL (ref 8.4–10.5)
Chloride: 93 mEq/L — ABNORMAL LOW (ref 96–112)
Creatinine, Ser: 0.94 mg/dL (ref 0.50–1.35)
GFR calc Af Amer: >90 mL/min (ref >90)
GFR calc non Af Amer: >90 mL/min (ref >90)
Glucose, Bld: 567 mg/dL (ref 70–99)
Potassium: 4.1 mmol/L (ref 3.5–5.1)
Sodium: 128 mmol/L — ABNORMAL LOW (ref 135–145)

## 2014-04-19 LAB — URINALYSIS, ROUTINE W REFLEX MICROSCOPIC
Bilirubin Urine: NEGATIVE
Glucose, UA: >1000 mg/dL — AB
Hgb urine dipstick: NEGATIVE
Ketones, ur: NEGATIVE mg/dL
Leukocytes, UA: NEGATIVE
Nitrite: NEGATIVE
Protein, ur: NEGATIVE mg/dL
Specific Gravity, Urine: 1.036 — ABNORMAL HIGH (ref 1.005–1.030)
Urobilinogen, UA: 1 mg/dL (ref 0.0–1.0)
pH: 6 (ref 5.0–8.0)

## 2014-04-19 LAB — URINE MICROSCOPIC-ADD ON

## 2014-04-19 LAB — GLUCOSE, CAPILLARY: Glucose-Capillary: 230 mg/dL — ABNORMAL HIGH (ref 70–99)

## 2014-04-19 MED ORDER — ASPIRIN 81 MG PO CHEW
81.0000 mg | CHEWABLE_TABLET | Freq: Every day | ORAL | Status: DC
Start: 2014-04-20 — End: 2014-04-24
  Administered 2014-04-20 – 2014-04-24 (×4): 81 mg via ORAL
  Filled 2014-04-19 (×5): qty 1

## 2014-04-19 MED ORDER — INSULIN REGULAR HUMAN 100 UNIT/ML IJ SOLN
INTRAMUSCULAR | Status: DC
  Filled 2014-04-19: qty 2.5

## 2014-04-19 MED ORDER — INSULIN ASPART 100 UNIT/ML ~~LOC~~ SOLN
10.0000 [IU] | Freq: Once | SUBCUTANEOUS | Status: AC
  Administered 2014-04-19: 10 [IU] via SUBCUTANEOUS
  Filled 2014-04-19: qty 1

## 2014-04-19 MED ORDER — DEXTROSE-NACL 5-0.45 % IV SOLN: INTRAVENOUS | Status: DC

## 2014-04-19 MED ORDER — DAPSONE 100 MG PO TABS
100.0000 mg | ORAL_TABLET | Freq: Every day | ORAL | Status: DC
  Administered 2014-04-20 – 2014-04-24 (×4): 100 mg via ORAL
  Filled 2014-04-19 (×5): qty 1

## 2014-04-19 MED ORDER — RITONAVIR 100 MG PO TABS
100.0000 mg | ORAL_TABLET | Freq: Every day | ORAL | Status: DC
  Administered 2014-04-20 – 2014-04-24 (×4): 100 mg via ORAL
  Filled 2014-04-19 (×6): qty 1

## 2014-04-19 MED ORDER — SODIUM CHLORIDE 0.9 % IV SOLN
1000.0000 mL | Freq: Once | INTRAVENOUS | Status: AC
  Administered 2014-04-19: 1000 mL via INTRAVENOUS

## 2014-04-19 MED ORDER — DARUNAVIR ETHANOLATE 600 MG PO TABS
600.0000 mg | ORAL_TABLET | Freq: Two times a day (BID) | ORAL | Status: DC
  Administered 2014-04-20: 600 mg via ORAL
  Filled 2014-04-19 (×3): qty 1

## 2014-04-19 MED ORDER — ENOXAPARIN SODIUM 40 MG/0.4ML ~~LOC~~ SOLN
40.0000 mg | SUBCUTANEOUS | Status: DC
  Administered 2014-04-20 – 2014-04-23 (×5): 40 mg via SUBCUTANEOUS
  Filled 2014-04-19 (×6): qty 0.4

## 2014-04-19 MED ORDER — MORPHINE SULFATE 4 MG/ML IJ SOLN
4.0000 mg | Freq: Once | INTRAMUSCULAR | Status: AC
Start: 2014-04-19 — End: 2014-04-19
  Administered 2014-04-19: 4 mg via INTRAVENOUS
  Filled 2014-04-19: qty 1

## 2014-04-19 MED ORDER — SODIUM CHLORIDE 0.9 % IV SOLN: 1000.0000 mL | INTRAVENOUS | Status: DC

## 2014-04-19 MED ORDER — PRAVASTATIN SODIUM 40 MG PO TABS
40.0000 mg | ORAL_TABLET | Freq: Every day | ORAL | Status: DC
  Administered 2014-04-20 – 2014-04-23 (×3): 40 mg via ORAL
  Filled 2014-04-19 (×5): qty 1

## 2014-04-19 MED ORDER — MORPHINE SULFATE 4 MG/ML IJ SOLN
4.0000 mg | INTRAMUSCULAR | Status: DC | PRN
  Administered 2014-04-20 (×3): 4 mg via INTRAVENOUS
  Filled 2014-04-19 (×3): qty 1

## 2014-04-19 MED ORDER — INSULIN ASPART 100 UNIT/ML ~~LOC~~ SOLN
0.0000 [IU] | Freq: Three times a day (TID) | SUBCUTANEOUS | Status: DC
Start: 2014-04-20 — End: 2014-04-21
  Administered 2014-04-20: 11 [IU] via SUBCUTANEOUS
  Administered 2014-04-20 – 2014-04-21 (×3): 7 [IU] via SUBCUTANEOUS

## 2014-04-19 MED ORDER — VANCOMYCIN HCL IN DEXTROSE 1-5 GM/200ML-% IV SOLN
1000.0000 mg | Freq: Once | INTRAVENOUS | Status: AC
  Administered 2014-04-19: 1000 mg via INTRAVENOUS
  Filled 2014-04-19: qty 200

## 2014-04-19 MED ORDER — IOHEXOL 300 MG/ML  SOLN
75.0000 mL | Freq: Once | INTRAMUSCULAR | Status: AC | PRN
  Administered 2014-04-19: 75 mL via INTRAVENOUS

## 2014-04-19 MED ORDER — INSULIN GLARGINE 100 UNIT/ML ~~LOC~~ SOLN
60.0000 [IU] | Freq: Every day | SUBCUTANEOUS | Status: DC
  Filled 2014-04-19: qty 0.6

## 2014-04-19 MED ORDER — SODIUM CHLORIDE 0.9 % IV SOLN
1000.0000 mL | Freq: Once | INTRAVENOUS | Status: AC
Start: 2014-04-19 — End: 2014-04-19
  Administered 2014-04-19: 1000 mL via INTRAVENOUS

## 2014-04-19 MED ORDER — RALTEGRAVIR POTASSIUM 400 MG PO TABS
400.0000 mg | ORAL_TABLET | Freq: Two times a day (BID) | ORAL | Status: DC
  Administered 2014-04-20 – 2014-04-21 (×5): 400 mg via ORAL
  Filled 2014-04-19 (×7): qty 1

## 2014-04-19 MED ORDER — INSULIN GLARGINE 100 UNIT/ML ~~LOC~~ SOLN
30.0000 [IU] | Freq: Every day | SUBCUTANEOUS | Status: DC
  Administered 2014-04-20: 30 [IU] via SUBCUTANEOUS
  Filled 2014-04-19 (×2): qty 0.3

## 2014-04-19 MED ORDER — TESTOSTERONE 50 MG/5GM (1%) TD GEL
5.0000 g | Freq: Every day | TRANSDERMAL | Status: DC
  Administered 2014-04-20 – 2014-04-24 (×4): 5 g via TRANSDERMAL
  Filled 2014-04-19 (×4): qty 5

## 2014-04-19 MED ORDER — EMTRICITABINE-TENOFOVIR DF 200-300 MG PO TABS
1.0000 | ORAL_TABLET | Freq: Every day | ORAL | Status: DC
  Administered 2014-04-20 – 2014-04-24 (×4): 1 via ORAL
  Filled 2014-04-19 (×5): qty 1

## 2014-04-19 NOTE — H&P (Signed)
Date: 04/20/2014               Patient Name:  Keith [name] MRN: [name]  DOB: 1966-03-14 Age / Sex: 49 y.o., male   PCP: Keith Sox, MD         Medical Service: Internal Medicine Teaching Service         Attending Physician: Dr. Annia Belt, MD    First Contact: Dr. Luan [name] Pager: [phone]  Second Contact: Dr. Natasha [name] Pager: [phone]       After Hours (After 5p/  First Contact Pager: [phone]  weekends / holidays): Second Contact Pager: [phone]   Chief Complaint: R neck pain and swelling  History of Present Illness: [name] is a 49 year old man with HIV, DM2, HTN, HL presents with R sided neck mass and R thumb abscess for the past three weeks. He first noticed the neck lesion when shaving and is unsure how he got it. It has gotten more swollen and painful over this time. He took a wet towel and tried to apply pressure. It has not produced any drainage. He describes the pain as a constant, sharp pain. It hurts to touch or put pressure on it.  He denies any issues with breathing or swallowing. He had a subjective fever this morning.The pain was relieved with morphine in ED and percocet at home. He does not think he has had any skin infections in the past although he has had a genital wart for the past 6-7 months but it looked different.  He also notes a pustule on his R palmar surface of the thumb that developed at the same time. This lesion is also painful to touch. It was bleeding today. He denies any chills, night sweats, chest pain, SOB, abdominal pain, nausea, emesis, diarrhea, constipation.  In the ED, he was noted to have a blood sugar of 618. He reports compliance with his medications (lantus 60 u qhs, humalog 8 u tidwc) but says he has not been eating as much. He does have documented non-compliance in EMR. He said he has polyuria and polydispia but denied any weakness, vision changes, jitteriness. His home sugar today was reportedly 250. He did not have urine  ketones, anion gap of 10, and serum osmolarity of 263.  He has had several admissions for hyperglycemia. he received novolog 10 u once, morphine 4 mg iv once, vancomycin 1000 mg iv, 2 L NS followed by NS @ 125 cc/hr.   Meds: Current Facility-Administered Medications  Medication Dose Route Frequency Provider Last Rate Last Dose  . 0.9 %  sodium chloride infusion  1,000 mL Intravenous Continuous Keith Fordyce, PA-C      . aspirin chewable tablet 81 mg  81 mg Oral Daily Keith Rabbani, MD      . dapsone tablet 100 mg  100 mg Oral Daily Keith Rabbani, MD      . darunavir (PREZISTA) tablet 600 mg  600 mg Oral BID WC Keith Rabbani, MD      . emtricitabine-tenofovir (TRUVADA) 200-300 MG per tablet 1 tablet  1 tablet Oral Daily Keith Rabbani, MD      . enoxaparin (LOVENOX) injection 40 mg  40 mg Subcutaneous Q24H Keith Rabbani, MD   40 mg at 04/20/14 0005  . insulin aspart (novoLOG) injection 0-20 Units  0-20 Units Subcutaneous TID WC Keith Rabbani, MD      . insulin glargine (LANTUS) injection 30 Units  30 Units Subcutaneous QHS Keith Rabbani,  MD      . morphine 4 MG/ML injection 4 mg  4 mg Intravenous Q4H PRN Keith Mire, MD   4 mg at 04/20/14 0005  . pravastatin (PRAVACHOL) tablet 40 mg  40 mg Oral q1800 Keith Rabbani, MD      . raltegravir (ISENTRESS) tablet 400 mg  400 mg Oral BID Keith Mire, MD   400 mg at 04/20/14 0005  . ritonavir (NORVIR) tablet 100 mg  100 mg Oral Q breakfast Keith Rabbani, MD      . testosterone (ANDROGEL) 50 MG/5GM (1%) gel 5 g  5 g Transdermal Daily Keith Mire, MD        Allergies: Allergies as of 04/19/2014 - Review Complete 04/19/2014  Allergen Reaction Noted  . Sulfa antibiotics Itching 03/26/2011   Past Medical History  Diagnosis Date  . HIV (human immunodeficiency virus infection) 2009    CD4 count 100, VL 13800 (05/01/2010)  . Diabetes type 2, uncontrolled     HgA1c 17.6 (04/27/2010)  . Hypertension   . Genital warts   . Erectile  dysfunction   . Chronic knee pain     right  . Osteomyelitis     h/o hand  . Diabetes mellitus   . Chronic pain   . AIDS    Past Surgical History  Procedure Laterality Date  . Multiple extractions with alveoloplasty N/A 01/18/2013    Procedure: MULTIPLE EXTRACION 3, 6, 7, 10, 11, 13, 21, 22, 27, 28, 29, 30 WITH ALVEOLOPLASTY;  Surgeon: Keith [name], DDS;  Location: [place];  Service: Oral Surgery;  Laterality: N/A;  . Hernia repair     Family History  Problem Relation Age of Onset  . Hypertension Mother   . Arthritis Father   . Hypertension Father   . Hypertension Brother   . Cancer Maternal Grandmother 86    unknown type of cancer  . Depression Paternal Grandmother    History   Social History  . Marital Status: Single    Spouse Name: N/A    Number of Children: 54  . Years of Education: 12   Occupational History  . Not on file.   Social History Main Topics  . Smoking status: Never Smoker   . Smokeless tobacco: Never Used  . Alcohol Use: No  . Drug Use: No  . Sexual Activity:    Partners: Female     Comment: given condoms   Other Topics Concern  . Not on file   Social History Narrative   Worked for the city of [place] for 18 years.   Unemployed.    Applying for disability.   Medicaid patient.    Review of Systems: A comprehensive review of systems was negative except for: as described above per HPI  Physical Exam: Blood pressure 149/93, pulse 77, temperature 98.2 F (36.8 C), temperature source Oral, resp. rate 17, height 5' 10.08" (1.78 m), SpO2 98 %.  Gen: A&O x 4, no acute distress, obese HEENT: Atraumatic, PERRL, EOMI, sclerae anicteric, moist mucous membranes Neck: R neck with great induration but no fluctulance (~10 cm x 5 cm region) overlying sternocleidomastoid muscle with small ~ 2 mm white pustule. No warmth, erythema. Tender to palpation throughout. Heart: Regular rate and rhythm, normal S1 S2, no murmurs, rubs, or gallops Lungs: Clear to  auscultation bilaterally, respirations unlabored Abd: Soft, non-tender, non-distended, + bowel sounds, no hepatosplenomegaly Ext: No edema or cyanosis. R palmar aspect of thumb with hard pustule (~ 1 cm x 1 cm) and  dark spot in center. No warmth, erythema. Tender to palpation.  Lab results: Basic Metabolic Panel:  Recent Labs  04/19/14 1845 04/19/14 1852  NA 128* 132*  K 4.1 4.3  CL 93* 94*  CO2 25  --   GLUCOSE 567* 618*  BUN 7 9  CREATININE 0.94 0.80  CALCIUM 9.6  --    CBC:  Recent Labs  04/19/14 1845 04/19/14 1852  WBC 8.5  --   NEUTROABS 5.0  --   HGB 12.6* 14.6  HCT 37.1* 43.0  MCV 78.4  --   PLT 228  --   CBG:  Recent Labs  04/19/14 1953 04/19/14 2316  GLUCAP 462* 230*   Urinalysis:  Recent Labs  04/19/14 1930  COLORURINE YELLOW  LABSPEC 1.036*  PHURINE 6.0  GLUCOSEU >1000*  HGBUR NEGATIVE  BILIRUBINUR NEGATIVE  KETONESUR NEGATIVE  PROTEINUR NEGATIVE  UROBILINOGEN 1.0  NITRITE NEGATIVE  LEUKOCYTESUR NEGATIVE   Imaging results:  Ct Soft Tissue Neck W Contrast  04/19/2014   CLINICAL DATA:  Right lateral neck mass or abscess.  EXAM: CT NECK WITH CONTRAST  TECHNIQUE: Multidetector CT imaging of the neck was performed using the standard protocol following the bolus administration of intravenous contrast.  CONTRAST:  53mL OMNIPAQUE IOHEXOL 300 MG/ML  SOLN  COMPARISON:  None.  FINDINGS: Pharynx and larynx: Normal.  Salivary glands: Normal.  Thyroid: Normal.  Lymph nodes: Mildly enlarged lymph nodes are noted bilaterally in the level 2 areas, with the largest measuring 20 x 15 mm in the right submandibular region. 16 x 14 mm lymph node is noted lateral to the right carotid bifurcation.  Vascular: No abnormality seen.  Limited intracranial: No abnormality seen.  Mastoids and visualized paranasal sinuses: No abnormality seen.  Skeleton: No abnormality seen.  Upper chest: Normal.  Inflammatory changes and skin thickening are seen overlying the right  sternocleidomastoid muscle consistent with focal cellulitis. No abscess is noted.  IMPRESSION: Probable cellulitis seen involving the subcutaneous tissues overlying the right sternocleidomastoid muscle. No definite fluid collection or abscess is seen. Significantly enlarged lymph nodes are noted in the right level 2 and submandibular regions most likely inflammatory in origin given the above findings.   Electronically Signed   By: Sabino Dick M.D.   On: 04/19/2014 20:12    Other results: EKG: none in ED  Assessment & Plan by Problem: Principal Problem:   Furuncle of neck Active Problems:   AIDS   Hyperglycemia due to type 2 diabetes mellitus  #Cellulitis: Mr Jelle has cellulitis overlying his R sternocleidomastoid. He has 0 of 4 SIRS criteria (no leukocytosis, fever, tachypnea, tachycardia) suggesting he does not have a systemic reaction to this infection. This may be a furuncle caused by an infected folliculitis. His CT is notable for probable cellulitis overlying the R sternocleidomastoid muscle with no definite fluid collection or abscess seen and likely reactive lymph nodes in the R level 2 and submandibular regions. There does not appear to be any fluid to drain but primary team can consider or call ENT in the morning. I have outlined with marker the area of induration so response to antibiotics can be monitored. He has been MRSA nares positive in the past and was started on vancomycin in the ED. Bacterial infection is most likely Streptococci or Staph aureus. He is at increased risk of infection given his poorly controlled DM2 and HIV. In the setting of infection and his history of poor follow-up, we will check his HIV labs here. Thumb lesion  does not have appearance of [place] nodes.  -vancomycin per pharmacy -NS @ 125 cc/hr -morphine 4 mg iv q4hprn -BCx x 2 (after vancomycin administration) -Lactic acid -LDH -procalcitonin  #Hyperglycemia in setting of poorly controlled DM2: Last A1c  02/04/14 was 14.9. He has documented history of medication non-compliance but was able to recall his medications from memory and says no issues taking them today. His cellulitis may have been the trigger for hyperglycemia versus non-compliance. Non-compliance is likely as he has responded well to the novolog 10 u in the ED with his sugars down to 230. We will adjust his lantus to half the home dose as he may not have been taking it and he is already down to blood glucose 200s. He did not have ketones in urine ruling out DKA and we also ruled out hyperglycemic hyperosmolar status as he did not have an anion gap (10) and his calculated serum osmolarity of 263. His hyponatremia of 128 on presentation is actually 133 when corrected for hyperglycemia. -lantus 30 u tonight as his current CBG is 230 and he received novolog 10 u in ED -SSI-resistant, no hs correction -CBG q4h -check A1c -osmolality  #HIV: He is followed by Dr [name] of [facility]. He was last seen 06/02/13 but no-showed to an appointment in 11/2013. This note mentions issues with non-compliance and said to continue current medications. Last labs were 02/08/2014 with CD4 150, RNA 45. His home medications are norvir 100 mg daily, prezista 800 mg daily, isentress 400 mg bid, and truvada 200-300 mg daily. His prophylaxis is dapsone 100 mg daily.  In the setting of infection and his history of poor follow-up, we will check his HIV labs here. -cont norvir 100 mg daily, prezista 800 mg daily, isentress 400 mg bid, truvada 200-300 mg daily, dapsone 100 mg daily -HIV RNA -CD4 count  #HTN: No home BP medications but on ASA 81 mg daily. BP in the 110s-150s/80s-100s.  -cont ASA 81 mg daily -cont to monitor -would benefit from ACEi if needed given DM2  #HL: Last lipid panel 06/02/13 cholesterol 268, triglyceride 196, HDL 43, LDL 186. On home pravastatin 40 mg daily -cont pravastatin 40 mg daily  #Diet: carb modified  #DVT PPx: lovenox 20 mg [place] daily  #Code:  full  Dispo: Disposition is deferred at this time, awaiting improvement of current medical problems. Anticipated discharge in approximately 2 day(s).   The patient does have a current PCP Keith Sox, MD) and does need an [facility] hospital follow-up appointment after discharge.  The patient does not know have transportation limitations that hinder transportation to clinic appointments.  Signed: Kelby Aline, MD 04/20/2014, 12:16 AM

## 2014-04-19 NOTE — ED Notes (Signed)
X 2 weeks: rt. Upper lateral abscess. Scabbed, no drainage.

## 2014-04-19 NOTE — ED Notes (Signed)
NOTIFIED OMALLEY ,PA FOR PATIENTS PANIC LAB RESULTS OF CHEM 8+ = GLUCOSE OF 618 mg/dl @18 :56 PM, 04/19/2014.

## 2014-04-19 NOTE — ED Notes (Signed)
Critical glucose, 567 by lab. Dr. Ree Kida.

## 2014-04-19 NOTE — ED Notes (Signed)
Called CT to notify that pt is ready for CT.

## 2014-04-19 NOTE — ED Notes (Signed)
Pt reports abscess on neck x2-3 weeks that is scabbed over to right neck.  Pt has h/a, no neurological deficits.

## 2014-04-19 NOTE — ED Notes (Signed)
Admitting at bedside 

## 2014-04-19 NOTE — ED Provider Notes (Signed)
CSN: [id number]     Arrival date & time 04/19/14  1606 History   First MD Initiated Contact with Patient 04/19/14 1745     Chief Complaint  Patient presents with  . Abscess     (Consider location/radiation/quality/duration/timing/severity/associated sxs/prior Treatment) HPI Pt is a 49yo male with hx of AIDS, IDDM type 2, HTN, genital warts, and chronic pain, presenting to ED with c/o right sided neck mass pt believes is an abscess as well as possible abscess to right thumb.  Pt states both lesions have been present for about 2 weeks. Pain in right side of neck is constant, gradually worsening, tender, crushing, and sharp in nature, 8/10.  Denies fever, n/v/d. Denies previous hx of abscesses or boils.   [facility]: Dr. [name] PCP: Clinton Gallant, MD (as of 01/30/14)  Pt's sugar found to be 618.  Pt states he does have hx of IDDM type 2 diabetes. States he just recently got his meter so he has not been checking his glucose regularly.  States he has been taking his insulin as prescribed but has not been eating much lately. States he has not had much of an appetite. States he has been drinking a lot of water.  Pt was admitted on 02/14/14 for hyperglycemia w/o ketosis.  Pt has been seen in ED multiple times for hyperglycemia. Denies fever, chills, n/v/d. Denies abdominal pain.   Past Medical History  Diagnosis Date  . HIV (human immunodeficiency virus infection) 2009    CD4 count 100, VL 13800 (05/01/2010)  . Diabetes type 2, uncontrolled     HgA1c 17.6 (04/27/2010)  . Hypertension   . Genital warts   . Erectile dysfunction   . Chronic knee pain     right  . Osteomyelitis     h/o hand  . Diabetes mellitus   . Chronic pain   . AIDS    Past Surgical History  Procedure Laterality Date  . Multiple extractions with alveoloplasty N/A 01/18/2013    Procedure: MULTIPLE EXTRACION 3, 6, 7, 10, 11, 13, 21, 22, 27, 28, 29, 30 WITH ALVEOLOPLASTY;  Surgeon: Gae Bon, DDS;  Location: [place];  Service:  Oral Surgery;  Laterality: N/A;  . Hernia repair     Family History  Problem Relation Age of Onset  . Hypertension Mother   . Arthritis Father   . Hypertension Father   . Hypertension Brother   . Cancer Maternal Grandmother 65    unknown type of cancer  . Depression Paternal Grandmother    History  Substance Use Topics  . Smoking status: Never Smoker   . Smokeless tobacco: Never Used  . Alcohol Use: No    Review of Systems  Constitutional: Positive for appetite change ( decreased). Negative for fever and chills.  HENT: Negative for drooling, sore throat, trouble swallowing and voice change.   Respiratory: Negative for cough and shortness of breath.   Gastrointestinal: Negative for nausea, vomiting and diarrhea.  Endocrine: Positive for polydipsia. Negative for polyphagia and polyuria.  Musculoskeletal: Positive for neck pain ( right sided pain, neck mass). Negative for back pain, arthralgias and neck stiffness.  Skin: Positive for wound ( abscess to right side of neck, wart/abscess right thumb).  All other systems reviewed and are negative.     Allergies  Sulfa antibiotics  Home Medications   Prior to Admission medications   Medication Sig Start Date End Date Taking? Authorizing Provider  aspirin 81 MG EC tablet Take 81 mg by mouth daily.  08/20/11  Yes [name], MD  Blood Glucose Monitoring Suppl (ACCU-CHEK NANO SMARTVIEW) W/DEVICE KIT Please check blood sugar daily and with meals 02/17/14  Yes Jacques Earthly, MD  dapsone 100 MG tablet TAKE 1 TABLET BY MOUTH EVERY DAY 03/28/14  Yes [name], MD  Insulin Glargine (LANTUS SOLOSTAR) 100 UNIT/ML Solostar Pen Inject 60 Units into the skin daily at 10 pm. 02/17/14  Yes Jacques Earthly, MD  insulin lispro (HUMALOG) 100 UNIT/ML KiwkPen Inject 0.08 mLs (8 Units total) into the skin 3 (three) times daily. 02/21/14  Yes Jacques Earthly, MD  Insulin Pen Needle 31G X 5 MM MISC 1 application by Does not apply route  once. diag code 250.02. Insulin dependent. Used for insulin inj 1 time daily 11/23/12  Yes Clinton Gallant, MD  ISENTRESS 400 MG tablet TAKE 1 TABLET BY MOUTH TWICE DAILY 03/28/14  Yes [name], MD  Oxycodone HCl 10 MG TABS Take 10 mg by mouth 3 (three) times daily.   Yes Historical Provider, MD  pravastatin (PRAVACHOL) 40 MG tablet Take 1 tablet (40 mg total) by mouth daily. 01/03/14  Yes Karren Cobble, MD  PREZISTA 800 MG tablet TAKE 1 TABLET BY MOUTH EVERY DAY WITH BREAKFAST 03/28/14  Yes [name], MD  testosterone (ANDROGEL) 50 MG/5GM GEL Place 5 g onto the skin daily.    Yes Historical Provider, MD  TRUVADA 200-300 MG per tablet TAKE 1 TABLET BY MOUTH DAILY 03/28/14  Yes [name], MD  NORVIR 100 MG TABS tablet TAKE 1 TABLET BY MOUTH EVERY DAY WITH BREAKFAST 03/28/14   [name], MD   BP 154/100 mmHg  Pulse 71  Temp(Src) 97.6 F (36.4 C) (Axillary)  Resp 14  SpO2 97% Physical Exam  Constitutional: He appears well-developed and well-nourished.  HENT:  Head: Normocephalic and atraumatic.  Eyes: Conjunctivae are normal. No scleral icterus.  Neck: Normal range of motion.    Right sided neck mass.  1cm area of raised skin with centralized scabbing, 5-6cm surrounding area of induration with severe tenderness expanding to just inferior right ear  Cardiovascular: Normal rate, regular rhythm and normal heart sounds.   Pulmonary/Chest: Effort normal and breath sounds normal. No respiratory distress. He has no wheezes. He has no rales. He exhibits no tenderness.  Abdominal: Soft. Bowel sounds are normal. He exhibits no distension and no mass. There is no tenderness. There is no rebound and no guarding.  Obese abdomen, soft, non-distended, non-tender.  Musculoskeletal: Normal range of motion.  Neurological: He is alert.  Skin: Skin is warm and dry.  Right side of neck: abscess/mass.  See neck exam. Right thumb: proximal volar aspect 0.5cm circular area of  induration, tenderness, centralized scab. No active discharge or bleeding. No red streaking.   Nursing note and vitals reviewed.   ED Course  Procedures (including critical care time) Labs Review Labs Reviewed  CBC WITH DIFFERENTIAL - Abnormal; Notable for the following:    Hemoglobin 12.6 (*)    HCT 37.1 (*)    All other components within normal limits  BASIC METABOLIC PANEL - Abnormal; Notable for the following:    Sodium 128 (*)    Chloride 93 (*)    Glucose, Bld 567 (*)    All other components within normal limits  URINALYSIS, ROUTINE W REFLEX MICROSCOPIC - Abnormal; Notable for the following:    Specific Gravity, Urine 1.036 (*)    Glucose, UA >1000 (*)    All other components  within normal limits  I-STAT CHEM 8, ED - Abnormal; Notable for the following:    Sodium 132 (*)    Chloride 94 (*)    Glucose, Bld 618 (*)    Calcium, Ion 1.27 (*)    All other components within normal limits  CBG MONITORING, ED - Abnormal; Notable for the following:    Glucose-Capillary 462 (*)    All other components within normal limits  MRSA PCR SCREENING  CULTURE, BLOOD (ROUTINE X 2)  CULTURE, BLOOD (ROUTINE X 2)  URINE MICROSCOPIC-ADD ON  PROCALCITONIN  LACTIC ACID, PLASMA  LACTATE DEHYDROGENASE  HEMOGLOBIN A1C  OSMOLALITY  T-HELPER CELLS (CD4) COUNT  HIV 1 RNA QUANT-NO REFLEX-BLD    Imaging Review Ct Soft Tissue Neck W Contrast  04/19/2014   CLINICAL DATA:  Right lateral neck mass or abscess.  EXAM: CT NECK WITH CONTRAST  TECHNIQUE: Multidetector CT imaging of the neck was performed using the standard protocol following the bolus administration of intravenous contrast.  CONTRAST:  60mL OMNIPAQUE IOHEXOL 300 MG/ML  SOLN  COMPARISON:  None.  FINDINGS: Pharynx and larynx: Normal.  Salivary glands: Normal.  Thyroid: Normal.  Lymph nodes: Mildly enlarged lymph nodes are noted bilaterally in the level 2 areas, with the largest measuring 20 x 15 mm in the right submandibular region. 16 x 14 mm  lymph node is noted lateral to the right carotid bifurcation.  Vascular: No abnormality seen.  Limited intracranial: No abnormality seen.  Mastoids and visualized paranasal sinuses: No abnormality seen.  Skeleton: No abnormality seen.  Upper chest: Normal.  Inflammatory changes and skin thickening are seen overlying the right sternocleidomastoid muscle consistent with focal cellulitis. No abscess is noted.  IMPRESSION: Probable cellulitis seen involving the subcutaneous tissues overlying the right sternocleidomastoid muscle. No definite fluid collection or abscess is seen. Significantly enlarged lymph nodes are noted in the right level 2 and submandibular regions most likely inflammatory in origin given the above findings.   Electronically Signed   By: Sabino Dick M.D.   On: 04/19/2014 20:12     EKG Interpretation None      MDM   Final diagnoses:  Cellulitis, neck  Hyperglycemia without ketosis    Pt is a 49yo male with hx of IDDM Type 2, HIV/AIDS, presenting to ED with c/o right sided neck mass believed to be an abscess. Pt also reports polydipsia.   On exam, right sided neck mass concerning for deep space infection. CT soft tissue neck with contrast ordered.  Pt has no respiratory distress, denies difficulty breathing.    6:58 PM istat Chem-8: glucose- 618.  Anion gap: 21.  Pt is stable, alert and oriented. No focal neuro deficit. Will give pt 10units SQ insulin, start on 2L IV fluids + maintenance fluids and glucostabilizer.  Pt will be admitted for hyperglycemia, however, will wait on CT soft tissue neck for further evaluation of right sided neck mass to see if any specialists will need to be consulted.   CT soft tissue neck: significant for probable cellulitis involving subcutaneous tissue overlying right SCM muscle. No definite fluid collection or abscess seen.  Significantly enlarged lymph nodes noted in right level 2 and submandibular regions, most likely inflammatory in origin.   UA:  negative for ketones  CBG has responded well to 10 units insulin and 1L IV fluids. CBG decreased from 618 to 462.  Second bag fluids hung.  Pt also started on 1g Vancomycin for cellulitis.    Discussed pt with Dr. Ralene Bathe  who also examined pt, will admit for hyperglycemia and cellulitis of neck.  8:36 PM Consulted with Internal medicine who agreed to admit pt. States they will place admission orders. Pt is hemodynamically stable at this time.    Noland Fordyce, PA-C 04/19/14 2202  Quintella Reichert, MD 04/19/14 [phone]

## 2014-04-20 DIAGNOSIS — Z22322 Carrier or suspected carrier of Methicillin resistant Staphylococcus aureus: Secondary | ICD-10-CM

## 2014-04-20 DIAGNOSIS — E349 Endocrine disorder, unspecified: Secondary | ICD-10-CM | POA: Diagnosis present

## 2014-04-20 LAB — BASIC METABOLIC PANEL
Anion gap: 4 — ABNORMAL LOW (ref 5–15)
BUN: 6 mg/dL (ref 6–23)
CO2: 29 mmol/L (ref 19–32)
Calcium: 8.8 mg/dL (ref 8.4–10.5)
Chloride: 98 mEq/L (ref 96–112)
Creatinine, Ser: 0.77 mg/dL (ref 0.50–1.35)
GFR calc Af Amer: >90 mL/min (ref >90)
GFR calc non Af Amer: >90 mL/min (ref >90)
Glucose, Bld: 237 mg/dL — ABNORMAL HIGH (ref 70–99)
Potassium: 3.4 mmol/L — ABNORMAL LOW (ref 3.5–5.1)
Sodium: 131 mmol/L — ABNORMAL LOW (ref 135–145)

## 2014-04-20 LAB — GLUCOSE, CAPILLARY
Glucose-Capillary: 269 mg/dL — ABNORMAL HIGH (ref 70–99)
Glucose-Capillary: 218 mg/dL — ABNORMAL HIGH (ref 70–99)
Glucose-Capillary: 264 mg/dL — ABNORMAL HIGH (ref 70–99)
Glucose-Capillary: 273 mg/dL — ABNORMAL HIGH (ref 70–99)

## 2014-04-20 LAB — HEMOGLOBIN A1C: Hgb A1c MFr Bld: >20 % — ABNORMAL HIGH (ref <5.7)

## 2014-04-20 LAB — CBC
HCT: 36.5 % — ABNORMAL LOW (ref 39.0–52.0)
Hemoglobin: 12.4 g/dL — ABNORMAL LOW (ref 13.0–17.0)
MCH: 27.1 pg (ref 26.0–34.0)
MCHC: 34 g/dL (ref 30.0–36.0)
MCV: 79.9 fL (ref 78.0–100.0)
Platelets: 213 10*3/uL (ref 150–400)
RBC: 4.57 MIL/uL (ref 4.22–5.81)
RDW: 12.7 % (ref 11.5–15.5)
WBC: 7.5 10*3/uL (ref 4.0–10.5)

## 2014-04-20 LAB — MRSA PCR SCREENING: MRSA by PCR: POSITIVE — AB

## 2014-04-20 LAB — OSMOLALITY: Osmolality: 284 mOsm/kg (ref 275–300)

## 2014-04-20 LAB — TESTOSTERONE: Testosterone: 237 ng/dL — ABNORMAL LOW (ref 300–890)

## 2014-04-20 LAB — PROCALCITONIN: Procalcitonin: <0.1 ng/mL

## 2014-04-20 LAB — LACTIC ACID, PLASMA: Lactic Acid, Venous: 0.9 mmol/L (ref 0.5–2.2)

## 2014-04-20 MED ORDER — VANCOMYCIN HCL IN DEXTROSE 1-5 GM/200ML-% IV SOLN
1000.0000 mg | INTRAVENOUS | Status: DC
  Filled 2014-04-20: qty 200

## 2014-04-20 MED ORDER — DARUNAVIR ETHANOLATE 800 MG PO TABS
800.0000 mg | ORAL_TABLET | Freq: Every day | ORAL | Status: DC
  Administered 2014-04-20 – 2014-04-24 (×4): 800 mg via ORAL
  Filled 2014-04-20 (×6): qty 1

## 2014-04-20 MED ORDER — INSULIN GLARGINE 100 UNIT/ML ~~LOC~~ SOLN
38.0000 [IU] | Freq: Every day | SUBCUTANEOUS | Status: DC
Start: 2014-04-20 — End: 2014-04-21
  Administered 2014-04-20: 38 [IU] via SUBCUTANEOUS
  Filled 2014-04-20 (×2): qty 0.38

## 2014-04-20 MED ORDER — DARUNAVIR ETHANOLATE 800 MG PO TABS: 800.0000 mg | ORAL_TABLET | Freq: Two times a day (BID) | ORAL | Status: DC

## 2014-04-20 MED ORDER — DARUNAVIR ETHANOLATE 800 MG PO TABS
800.0000 mg | ORAL_TABLET | Freq: Every day | ORAL | Status: DC
  Filled 2014-04-20 (×4): qty 1

## 2014-04-20 MED ORDER — VANCOMYCIN HCL IN DEXTROSE 1-5 GM/200ML-% IV SOLN
1000.0000 mg | Freq: Three times a day (TID) | INTRAVENOUS | Status: DC
  Administered 2014-04-20 – 2014-04-24 (×10): 1000 mg via INTRAVENOUS
  Filled 2014-04-20 (×14): qty 200

## 2014-04-20 MED ORDER — CHLORHEXIDINE GLUCONATE CLOTH 2 % EX PADS
6.0000 | MEDICATED_PAD | Freq: Every day | CUTANEOUS | Status: DC
  Administered 2014-04-20 – 2014-04-24 (×3): 6 via TOPICAL

## 2014-04-20 MED ORDER — OXYCODONE HCL 5 MG PO TABS
10.0000 mg | ORAL_TABLET | Freq: Four times a day (QID) | ORAL | Status: DC | PRN
  Administered 2014-04-20 – 2014-04-24 (×11): 10 mg via ORAL
  Filled 2014-04-20 (×11): qty 2

## 2014-04-20 MED ORDER — MUPIROCIN 2 % EX OINT
1.0000 "application " | TOPICAL_OINTMENT | Freq: Two times a day (BID) | CUTANEOUS | Status: DC
  Administered 2014-04-20 – 2014-04-24 (×9): 1 via NASAL
  Filled 2014-04-20: qty 22

## 2014-04-20 NOTE — Progress Notes (Signed)
Utilization review completed. Zaineb Nowaczyk, RN, BSN. 

## 2014-04-20 NOTE — Progress Notes (Signed)
Inpatient Diabetes Program Recommendations  AACE/ADA: New Consensus Statement on Inpatient Glycemic Control (2013)  Target Ranges:  Prepandial:   less than 140 mg/dL      Peak postprandial:   less than 180 mg/dL (1-2 hours)      Critically ill patients:  140 - 180 mg/dL   Results for Keith [name], Keith [name] (MRN [id number]) as of 04/20/2014 14:18  Ref. Range 04/19/2014 19:53 04/19/2014 23:16 04/20/2014 06:47 04/20/2014 12:05  Glucose-Capillary Latest Range: 70-99 mg/dL 462 (H) 230 (H) 269 (H) 218 (H)    Diabetes history: DM 2 Outpatient Diabetes medications: Lantus 60 units QHS, Humalog 8 units TID with meals for meal coverage. Current orders for Inpatient glycemic control: Lantus 30 units QHS, Novolog 0-20 units TID with meals.  Inpatient Diabetes Program Recommendations Insulin - Basal: Please consider increasing Lantus to 48-50 units QHS (which is 80% of home dose). Correction (SSI): Please consider adding Novolog 0-5 units QHS to cover blood sugars at bedtime. Insulin - Meal Coverage: Please consider adding Novolog 6 units TID with meals for meal coverage.  Thanks,  Tama Headings RN, MSN, [facility] Inpatient Diabetes Coordinator Team Pager [phone]

## 2014-04-20 NOTE — Progress Notes (Signed)
Subjective: No overnight events. The pain is his neck has improved. Denies fever/chills, no discharge from the neck.   Objective: Vital signs in last 24 hours: Filed Vitals:   04/19/14 2145 04/19/14 2215 04/19/14 2252 04/20/14 0624  BP: 154/100 144/98 149/93 128/81  Pulse: 71 76 77 71  Temp:   98.2 F (36.8 C) 97.9 F (36.6 C)  TempSrc:   Oral Oral  Resp:   17 18  Height:  5' 10.08" (1.78 m)    SpO2: 97% 100% 98% 97%   Weight change:   Intake/Output Summary (Last 24 hours) at 04/20/14 1155 Last data filed at 04/20/14 0900  Gross per 24 hour  Intake    240 ml  Output      0 ml  Net    240 ml   Vitals reviewed. General: resting in bed, in NAD HEENT: no scleral icterus, MM. Neck: right neck with induration of ~10cm x5cm over sternocleidomastoid muscle with small central ~62mm dry scab. Induration was marked yesterday and has not spread. No fluctuance, erythema, or increased warmth. TTP throughout.  Cardiac: RRR, no rubs, murmurs or gallops Pulm: clear to auscultation bilaterally, no wheezes, rales, or rhonchi Abd: soft, nontender, nondistended, BS present Ext: warm and well perfused, no pedal edema Neuro: alert and oriented X3, able to move all extremities voluntairly  Lab Results: Basic Metabolic Panel:  Recent Labs Lab 04/19/14 1845 04/19/14 1852 04/20/14 0626  NA 128* 132* 131*  K 4.1 4.3 3.4*  CL 93* 94* 98  CO2 25  --  29  GLUCOSE 567* 618* 237*  BUN 7 9 6   CREATININE 0.94 0.80 0.77  CALCIUM 9.6  --  8.8  CBC:  Recent Labs Lab 04/19/14 1845 04/19/14 1852 04/20/14 0626  WBC 8.5  --  7.5  NEUTROABS 5.0  --   --   HGB 12.6* 14.6 12.4*  HCT 37.1* 43.0 36.5*  MCV 78.4  --  79.9  PLT 228  --  213   CBG:  Recent Labs Lab 04/19/14 1953 04/19/14 2316 04/20/14 0647  GLUCAP 462* 230* 269*     Urinalysis:  Recent Labs Lab 04/19/14 1930  COLORURINE YELLOW  LABSPEC 1.036*  PHURINE 6.0  GLUCOSEU >1000*  HGBUR NEGATIVE  BILIRUBINUR NEGATIVE    KETONESUR NEGATIVE  PROTEINUR NEGATIVE  UROBILINOGEN 1.0  NITRITE NEGATIVE  LEUKOCYTESUR NEGATIVE   Micro Results: Recent Results (from the past 240 hour(s))  MRSA PCR Screening     Status: Abnormal   Collection Time: 04/19/14 11:07 PM  Result Value Ref Range Status   MRSA by PCR POSITIVE (A) NEGATIVE Final    Comment:        The GeneXpert MRSA Assay (FDA approved for NASAL specimens only), is one component of a comprehensive MRSA colonization surveillance program. It is not intended to diagnose MRSA infection nor to guide or monitor treatment for MRSA infections. RESULT CALLED TO, READ BACK BY AND VERIFIED WITH: M.YIM,RN 0117 //13/16 M.CAMPBELL    Studies/Results: Ct Soft Tissue Neck W Contrast  04/19/2014   CLINICAL DATA:  Right lateral neck mass or abscess.  EXAM: CT NECK WITH CONTRAST  TECHNIQUE: Multidetector CT imaging of the neck was performed using the standard protocol following the bolus administration of intravenous contrast.  CONTRAST:  68mL OMNIPAQUE IOHEXOL 300 MG/ML  SOLN  COMPARISON:  None.  FINDINGS: Pharynx and larynx: Normal.  Salivary glands: Normal.  Thyroid: Normal.  Lymph nodes: Mildly enlarged lymph nodes are noted bilaterally in the level  2 areas, with the largest measuring 20 x 15 mm in the right submandibular region. 16 x 14 mm lymph node is noted lateral to the right carotid bifurcation.  Vascular: No abnormality seen.  Limited intracranial: No abnormality seen.  Mastoids and visualized paranasal sinuses: No abnormality seen.  Skeleton: No abnormality seen.  Upper chest: Normal.  Inflammatory changes and skin thickening are seen overlying the right sternocleidomastoid muscle consistent with focal cellulitis. No abscess is noted.  IMPRESSION: Probable cellulitis seen involving the subcutaneous tissues overlying the right sternocleidomastoid muscle. No definite fluid collection or abscess is seen. Significantly enlarged lymph nodes are noted in the right level  2 and submandibular regions most likely inflammatory in origin given the above findings.   Electronically Signed   By: Sabino Dick M.D.   On: 04/19/2014 20:12   Medications: I have reviewed the patient's current medications. Scheduled Meds: . aspirin  81 mg Oral Daily  . Chlorhexidine Gluconate Cloth  6 each Topical Q0600  . dapsone  100 mg Oral Daily  . Darunavir Ethanolate  600 mg Oral BID WC  . emtricitabine-tenofovir  1 tablet Oral Daily  . enoxaparin (LOVENOX) injection  40 mg Subcutaneous Q24H  . insulin aspart  0-20 Units Subcutaneous TID WC  . insulin glargine  30 Units Subcutaneous QHS  . mupirocin ointment  1 application Nasal BID  . pravastatin  40 mg Oral q1800  . raltegravir  400 mg Oral BID  . ritonavir  100 mg Oral Q breakfast  . testosterone  5 g Transdermal Daily   Continuous Infusions:  PRN Meds:.morphine injection Assessment/Plan: 49 yr old man with poorly controlled HIV, poorly controlled DM2, HTN, HLP, presenting with right neck mass and right thumb wound.   Cellulitis: CT soft tissue neck remarkable for probable cellulitis over the right sternocleidomastoid muscle with no fluid collection, reactive lymph nodes. Has hx of MRSA per nasal swab.  -Continue IV vancomycin -Warm compresses to affected area q1h -Discontinue morphine -Start Oxycodone 10mg  q6h PRN -f/u blood cultures  DM2: poorly controlled. HgbA1C of >20%. CBG today slightly above 200. On lantus 60 units qHS and Humalog 8u TID ac but with hx of noncompliance.  -Will increase Lantus from 30 units to 38 units (pt likely not using 60 units at home).  -SSI resistant   HIV: poorly controlled due to hx of noncompliance. HIV RNA and CD4 count pending.  -Continue home meds per last refill (in November) from Dr. [name] in [place]  HTN: BP in 110-150/80-100 range. No on antihypertensives at home.  -Consider starting lisinopril 5mg  daily if BP persistently elevated--though his pain may be leading to rise in BP.    Hyperlipidemia: continue home pravastatin 40mg  daily  DVT prophylaxis: Lovenox 20mg  [place] daily   FEN: NSL Replete as needed Carb mod diet  Dispo: Disposition is deferred at this time, awaiting improvement of current medical problems.  Anticipated discharge in approximately 1-2 day(s).   The patient does have a current PCP Corky Sox, MD) and does not need an [facility] hospital follow-up appointment after discharge.  The patient does not have transportation limitations that hinder transportation to clinic appointments.  .Services Needed at time of discharge: Y = Yes, Blank = No PT:   OT:   RN:   Equipment:   Other:     LOS: 1 day   Blain Pais, MD  IMTS, PGY3 04/20/2014, 11:55 AM

## 2014-04-20 NOTE — Progress Notes (Signed)
ANTIBIOTIC CONSULT NOTE - INITIAL  Pharmacy Consult for Vancomycin Indication: cellulitis  Allergies  Allergen Reactions  . Sulfa Antibiotics Itching    Patient Measurements: Height: 5' 10.08" (178 cm) Weight: 250 lb (113.399 kg) (estimated by patient) IBW/kg (Calculated) : 73.18  Vital Signs: Temp: 97.9 F (36.6 C) (01/13 0624) Temp Source: Oral (01/13 0624) BP: 128/81 mmHg (01/13 0624) Pulse Rate: 71 (01/13 0624)  Labs:  Recent Labs  04/19/14 1845 04/19/14 1852 04/20/14 0626  WBC 8.5  --  7.5  HGB 12.6* 14.6 12.4*  PLT 228  --  213  CREATININE 0.94 0.80 0.77   Estimated Creatinine Clearance: 141.1 mL/min (by C-G formula based on Cr of 0.77).  Microbiology: Recent Results (from the past 720 hour(s))  MRSA PCR Screening     Status: Abnormal   Collection Time: 04/19/14 11:07 PM  Result Value Ref Range Status   MRSA by PCR POSITIVE (A) NEGATIVE Final    Comment:        The GeneXpert MRSA Assay (FDA approved for NASAL specimens only), is one component of a comprehensive MRSA colonization surveillance program. It is not intended to diagnose MRSA infection nor to guide or monitor treatment for MRSA infections. RESULT CALLED TO, READ BACK BY AND VERIFIED WITH: M.YIM,RN 0117 //13/16 M.CAMPBELL     Medical History: Past Medical History  Diagnosis Date  . HIV (human immunodeficiency virus infection) 2009    CD4 count 100, VL 13800 (05/01/2010)  . Diabetes type 2, uncontrolled     HgA1c 17.6 (04/27/2010)  . Hypertension   . Genital warts   . Erectile dysfunction   . Chronic knee pain     right  . Osteomyelitis     h/o hand  . Diabetes mellitus   . Chronic pain   . AIDS    Assessment:   49 yr old male admitted last night with cellulitis of R neck.  Vancomycin 1 gram IV given ~9pm last night.  Afebrile, WBC 7/5.  Blood cultures pending. HIV +, home anti-retrovirals continued.   MRSA PCR positive.  CHG bath/Bactroban nasal x 5 days begun.  Goal of  Therapy:  Vancomycin trough level 10-15 mcg/ml  Plan:    Vancomycin 1 gram IV q8hrs.   Will follow renal function, culture data and progress.   Will plan to check vancomycin trough level at steady-state.  Arty Baumgartner, [place] Pager: [phone] 04/20/2014,5:04 PM

## 2014-04-20 NOTE — Progress Notes (Signed)
Subjective: Patient states he is still experiencing severe pain that improves with morphine. He is able to eat breakfast despite the inflammation. [name] says his pain is usually controlled on Oxycontin 10 mg by mouth on an outpatient basis  Objective: Vital signs in last 24 hours: Filed Vitals:   04/19/14 2145 04/19/14 2215 04/19/14 2252 04/20/14 0624  BP: 154/100 144/98 149/93 128/81  Pulse: 71 76 77 71  Temp:   98.2 F (36.8 C) 97.9 F (36.6 C)  TempSrc:   Oral Oral  Resp:   17 18  Height:  5' 10.08" (1.78 m)    SpO2: 97% 100% 98% 97%   Weight change:   Intake/Output Summary (Last 24 hours) at 04/20/14 1358 Last data filed at 04/20/14 0900  Gross per 24 hour  Intake    240 ml  Output      0 ml  Net    240 ml   Constitutional: Patient lying in bed in no acute distress. Alert and cooperative with exam  Head: Normocephalic and atraumatic Nose: No erythema or drainage noted Mouth: No erythema or exudates, MMM Eyes: PERRL, EOMI, conjunctivae normal Cardiovascular: RRR, S1 normal, S2 normal, no murmurs, rubs, or gallops Pulmonary/Chest: normal respiratory effort, CTAB, no wheezes, rales, or rhonchi Abdominal: Non-tender, non-distended, bowel sounds are normal, no masses, organomegaly, or guarding present Skin: Raised skin lesion at right mandible with dried exudate tender to light palpation and warm with circumference of about 8 cm Psychiatric: Normal mood and affect  Lab Results: Basic Metabolic Panel:  Recent Labs Lab 04/19/14 1845 04/19/14 1852 04/20/14 0626  NA 128* 132* 131*  K 4.1 4.3 3.4*  CL 93* 94* 98  CO2 25  --  29  GLUCOSE 567* 618* 237*  BUN 7 9 6   CREATININE 0.94 0.80 0.77  CALCIUM 9.6  --  8.8   Liver Function Tests: No results for input(s): AST, ALT, ALKPHOS, BILITOT, PROT, ALBUMIN in the last 168 hours. No results for input(s): LIPASE, AMYLASE in the last 168 hours. No results for input(s): AMMONIA in the last 168 hours. CBC:  Recent  Labs Lab 04/19/14 1845 04/19/14 1852 04/20/14 0626  WBC 8.5  --  7.5  NEUTROABS 5.0  --   --   HGB 12.6* 14.6 12.4*  HCT 37.1* 43.0 36.5*  MCV 78.4  --  79.9  PLT 228  --  213   Cardiac Enzymes: No results for input(s): CKTOTAL, CKMB, CKMBINDEX, TROPONINI in the last 168 hours. BNP: No results for input(s): PROBNP in the last 168 hours. D-Dimer: No results for input(s): DDIMER in the last 168 hours. CBG:  Recent Labs Lab 04/19/14 1953 04/19/14 2316 04/20/14 0647 04/20/14 1205  GLUCAP 462* 230* 269* 218*   Hemoglobin A1C: No results for input(s): HGBA1C in the last 168 hours. Fasting Lipid Panel: No results for input(s): CHOL, HDL, LDLCALC, TRIG, CHOLHDL, LDLDIRECT in the last 168 hours. Thyroid Function Tests: No results for input(s): TSH, T4TOTAL, FREET4, T3FREE, THYROIDAB in the last 168 hours. Coagulation: No results for input(s): LABPROT, INR in the last 168 hours. Anemia Panel: No results for input(s): VITAMINB12, FOLATE, FERRITIN, TIBC, IRON, RETICCTPCT in the last 168 hours. Urine Drug Screen: Drugs of Abuse     Component Value Date/Time   LABOPIA NONE DETECTED 02/04/2014 0630   LABOPIA NEG 11/22/2011 1402   COCAINSCRNUR NONE DETECTED 02/04/2014 0630   COCAINSCRNUR NEG 11/22/2011 1402   LABBENZ NONE DETECTED 02/04/2014 0630   LABBENZ NEG 11/22/2011 1402  LABBENZ NEG 08/07/2011 1217   AMPHETMU NONE DETECTED 02/04/2014 0630   AMPHETMU NEG 11/22/2011 1402   AMPHETMU NEG 08/07/2011 1217   THCU NONE DETECTED 02/04/2014 0630   THCU NEG 11/22/2011 1402   LABBARB NONE DETECTED 02/04/2014 0630   LABBARB NEG 11/22/2011 1402    Alcohol Level: No results for input(s): ETH in the last 168 hours. Urinalysis:  Recent Labs Lab 04/19/14 1930  COLORURINE YELLOW  LABSPEC 1.036*  PHURINE 6.0  GLUCOSEU >1000*  HGBUR NEGATIVE  BILIRUBINUR NEGATIVE  KETONESUR NEGATIVE  PROTEINUR NEGATIVE  UROBILINOGEN 1.0  NITRITE NEGATIVE  LEUKOCYTESUR NEGATIVE    Micro  Results: Recent Results (from the past 240 hour(s))  MRSA PCR Screening     Status: Abnormal   Collection Time: 04/19/14 11:07 PM  Result Value Ref Range Status   MRSA by PCR POSITIVE (A) NEGATIVE Final    Comment:        The GeneXpert MRSA Assay (FDA approved for NASAL specimens only), is one component of a comprehensive MRSA colonization surveillance program. It is not intended to diagnose MRSA infection nor to guide or monitor treatment for MRSA infections. RESULT CALLED TO, READ BACK BY AND VERIFIED WITH: M.YIM,RN 0117 //13/16 M.CAMPBELL    Studies/Results: Ct Soft Tissue Neck W Contrast  04/19/2014   CLINICAL DATA:  Right lateral neck mass or abscess.  EXAM: CT NECK WITH CONTRAST  TECHNIQUE: Multidetector CT imaging of the neck was performed using the standard protocol following the bolus administration of intravenous contrast.  CONTRAST:  39mL OMNIPAQUE IOHEXOL 300 MG/ML  SOLN  COMPARISON:  None.  FINDINGS: Pharynx and larynx: Normal.  Salivary glands: Normal.  Thyroid: Normal.  Lymph nodes: Mildly enlarged lymph nodes are noted bilaterally in the level 2 areas, with the largest measuring 20 x 15 mm in the right submandibular region. 16 x 14 mm lymph node is noted lateral to the right carotid bifurcation.  Vascular: No abnormality seen.  Limited intracranial: No abnormality seen.  Mastoids and visualized paranasal sinuses: No abnormality seen.  Skeleton: No abnormality seen.  Upper chest: Normal.  Inflammatory changes and skin thickening are seen overlying the right sternocleidomastoid muscle consistent with focal cellulitis. No abscess is noted.  IMPRESSION: Probable cellulitis seen involving the subcutaneous tissues overlying the right sternocleidomastoid muscle. No definite fluid collection or abscess is seen. Significantly enlarged lymph nodes are noted in the right level 2 and submandibular regions most likely inflammatory in origin given the above findings.   Electronically Signed    By: Sabino Dick M.D.   On: 04/19/2014 20:12   Medications:  Scheduled Meds: . aspirin  81 mg Oral Daily  . Chlorhexidine Gluconate Cloth  6 each Topical Q0600  . dapsone  100 mg Oral Daily  . Darunavir Ethanolate  600 mg Oral BID WC  . emtricitabine-tenofovir  1 tablet Oral Daily  . enoxaparin (LOVENOX) injection  40 mg Subcutaneous Q24H  . insulin aspart  0-20 Units Subcutaneous TID WC  . insulin glargine  30 Units Subcutaneous QHS  . mupirocin ointment  1 application Nasal BID  . pravastatin  40 mg Oral q1800  . raltegravir  400 mg Oral BID  . ritonavir  100 mg Oral Q breakfast  . testosterone  5 g Transdermal Daily   Continuous Infusions:  PRN Meds:.morphine injection  Assessment/Plan: Principal Problem:   Furuncle of neck Active Problems:   AIDS   Essential hypertension   Hyperlipidemia   Hyperglycemia due to type 2 diabetes mellitus  MRSA carrier   Testosterone deficiency  Keith [name] is a 49 y.o. male with PMH of HIV, DM2, HTN, HL presents with R sided neck mass and R thumb abscess for the past three weeks.   **Cellulitis: This may be a furuncle caused by an infected folliculitis. His CT is notable for probable cellulitis overlying the R sternocleidomastoid muscle with no definite fluid collection or abscess seen and likely reactive lymph nodes in the R level 2 and submandibular regions. There does not appear to be any fluid to drain. He was MRSA positive on 1/12 and was started on vancomycin in the ED. Bacterial infection is most likely Streptococci or Staph aureus. He is at increased risk of infection given his poorly controlled DM2 and HIV -Continue Vancomycin 1 g IV -Continue Morphine 4 mg iv q4hprn. Consider switching to medication by mouth since he is able to eat -Blood Cultures x 2 (after vancomycin administration): NGTD -Lactic acid: 0.9 -Procalcitonin: <0.1  **Hyperglycemia in setting of poorly controlled DM2: Last A1c 02/04/14 was 14.9. He has  documented history of medication non-compliance but was able to recall his medications from memory and says no issues taking them today. His cellulitis may have been the trigger for hyperglycemia versus non-compliance. Non-compliance is likely as he has responded well to the novolog 10 u in the ED with his sugars down to 230. We will adjust his lantus to half the home dose as he may not have been taking it and he is already down to blood glucose 200s. He did not have ketones in urine ruling out DKA and we also ruled out hyperglycemic hyperosmolar status as he did not have an anion gap (10) and his calculated serum osmolarity of 263. His hyponatremia of 128 on presentation is actually 133 when corrected for hyperglycemia. -Continue on Lantus 30 u tonight as his current CBG is 230 and he received novolog 10 u in ED -SSI-resistant, no hs correction -CBG q4h. On 1/3 Blood glucose at 218 -Hgb A1C >20 -Osmolality: 284  **HIV: He is followed by Dr [name] of [facility]. He was last seen 06/02/13 but no-showed to an appointment in 11/2013. This note mentions issues with non-compliance and said to continue current medications. Last labs were 02/08/2014 with CD4 150, RNA 45. His home medications are norvir 100 mg daily, prezista 800 mg daily, isentress 400 mg bid, and truvada 200-300 mg daily. His prophylaxis is dapsone 100 mg daily. In the setting of infection and his history of poor follow-up, we will check his HIV labs here. -Continue norvir 100 mg daily, prezista 800 mg daily, isentress 400 mg bid, truvada 200-300 mg daily, dapsone 100 mg daily -HIV RNA: Pending -CD4 count: Pending  **HTN: No home BP medications but on ASA 81 mg daily. BP in the 110s-150s/80s-100s.  -Continue ASA 81 mg daily -Would benefit from ACEi if needed given DM2  **HLD: Last lipid panel 06/02/13 cholesterol 268, triglyceride 196, HDL 43, LDL 186. On home pravastatin 40 mg daily -Continue pravastatin 40 mg daily  Diet: Carb modified  DVT  PPx: Lovenox 20 mg [place] daily  Code: Full  Dispo: Disposition is deferred at this time, awaiting improvement of current medical problems. Anticipated discharge in approximately 2 day(s).   The patient does have a current PCP Corky Sox, MD) and does need an [facility] hospital follow-up appointment after discharge  .Services Needed at time of discharge: Y = Yes, Blank = No PT:   OT:   RN:   Equipment:  Other:     LOS: 1 day   Signed: Laneta Simmers, Med Student Internal Medicine Teaching Service Team B2  [phone] 04/20/2014, 1:58 PM

## 2014-04-21 DIAGNOSIS — E1165 Type 2 diabetes mellitus with hyperglycemia: Secondary | ICD-10-CM

## 2014-04-21 DIAGNOSIS — B20 Human immunodeficiency virus [HIV] disease: Secondary | ICD-10-CM

## 2014-04-21 DIAGNOSIS — I10 Essential (primary) hypertension: Secondary | ICD-10-CM

## 2014-04-21 DIAGNOSIS — L0390 Cellulitis, unspecified: Secondary | ICD-10-CM

## 2014-04-21 DIAGNOSIS — E785 Hyperlipidemia, unspecified: Secondary | ICD-10-CM

## 2014-04-21 DIAGNOSIS — Z21 Asymptomatic human immunodeficiency virus [HIV] infection status: Secondary | ICD-10-CM

## 2014-04-21 DIAGNOSIS — L0211 Cutaneous abscess of neck: Principal | ICD-10-CM

## 2014-04-21 DIAGNOSIS — L03221 Cellulitis of neck: Secondary | ICD-10-CM | POA: Insufficient documentation

## 2014-04-21 DIAGNOSIS — L0212 Furuncle of neck: Secondary | ICD-10-CM

## 2014-04-21 LAB — HIV-1 RNA QUANT-NO REFLEX-BLD
HIV 1 RNA Quant: 284 copies/mL — ABNORMAL HIGH (ref <20)
HIV-1 RNA Quant, Log: 2.45 {Log} — ABNORMAL HIGH (ref <1.30)

## 2014-04-21 LAB — GLUCOSE, CAPILLARY
Glucose-Capillary: 228 mg/dL — ABNORMAL HIGH (ref 70–99)
Glucose-Capillary: 191 mg/dL — ABNORMAL HIGH (ref 70–99)
Glucose-Capillary: 176 mg/dL — ABNORMAL HIGH (ref 70–99)
Glucose-Capillary: 263 mg/dL — ABNORMAL HIGH (ref 70–99)

## 2014-04-21 MED ORDER — INSULIN ASPART 100 UNIT/ML ~~LOC~~ SOLN
0.0000 [IU] | Freq: Three times a day (TID) | SUBCUTANEOUS | Status: DC
  Administered 2014-04-21 – 2014-04-22 (×3): 2 [IU] via SUBCUTANEOUS
  Administered 2014-04-22: 3 [IU] via SUBCUTANEOUS
  Administered 2014-04-23: 1 [IU] via SUBCUTANEOUS
  Administered 2014-04-23: 2 [IU] via SUBCUTANEOUS
  Administered 2014-04-23: 3 [IU] via SUBCUTANEOUS
  Administered 2014-04-24 (×2): 5 [IU] via SUBCUTANEOUS

## 2014-04-21 MED ORDER — INSULIN ASPART 100 UNIT/ML ~~LOC~~ SOLN
0.0000 [IU] | Freq: Every day | SUBCUTANEOUS | Status: DC
  Administered 2014-04-21 – 2014-04-22 (×2): 3 [IU] via SUBCUTANEOUS

## 2014-04-21 MED ORDER — INSULIN ASPART 100 UNIT/ML ~~LOC~~ SOLN
6.0000 [IU] | Freq: Three times a day (TID) | SUBCUTANEOUS | Status: DC
  Administered 2014-04-21 – 2014-04-22 (×3): 6 [IU] via SUBCUTANEOUS

## 2014-04-21 MED ORDER — INSULIN GLARGINE 100 UNIT/ML ~~LOC~~ SOLN
50.0000 [IU] | Freq: Every day | SUBCUTANEOUS | Status: DC
Start: 2014-04-21 — End: 2014-04-23
  Administered 2014-04-21 – 2014-04-22 (×2): 50 [IU] via SUBCUTANEOUS
  Filled 2014-04-21 (×2): qty 0.5

## 2014-04-21 NOTE — Progress Notes (Signed)
Subjective: Patient states pain is improving compared to yesterday with oral pain medication. Warm compresses helped and he feels swelling has decreased. He states he has been compliant with insulin regimen. Patient is eager to leave the hospital because he has a couple of appointments regarding his apartment and HIV follow up.   Objective: Vital signs in last 24 hours: Filed Vitals:   04/20/14 0624 04/20/14 1500 04/20/14 2011 04/21/14 0537  BP: 128/81  134/97 106/76  Pulse: 71  91 66  Temp: 97.9 F (36.6 C)  98.5 F (36.9 C) 98 F (36.7 C)  TempSrc: Oral  Oral Oral  Resp: 18  19 17   Height:      Weight:  113.399 kg (250 lb)    SpO2: 97%  100% 99%   Weight change:   Intake/Output Summary (Last 24 hours) at 04/21/14 0824 Last data filed at 04/21/14 0537  Gross per 24 hour  Intake    580 ml  Output   1200 ml  Net   -620 ml   Constitutional: Patient lying in bed in no acute distress and cooperative with exam Nose: No erythema or drainage noted Mouth: No erythema or exudates, MMM Eyes: PERRL, EOMI, conjunctivae normal Neck: Indurated 4 cm area that is warm to touch, tender to palpation with three white areas in the center. ROM of neck intact Cardiovascular: RRR, S1 normal, S2 normal, no murmurs, rubs, or gallops. Pulses 2+ Pulmonary/Chest: normal respiratory effort, CTAB, no wheezes, rales, or rhonchi Abdominal: Non-tender, non-distended, bowel sounds are normal, no masses, organomegaly, or guarding present Skin: Warm, dry Psychiatric: Normal mood and affect  Lab Results: Basic Metabolic Panel:  Recent Labs Lab 04/19/14 1845 04/19/14 1852 04/20/14 0626  NA 128* 132* 131*  K 4.1 4.3 3.4*  CL 93* 94* 98  CO2 25  --  29  GLUCOSE 567* 618* 237*  BUN 7 9 6   CREATININE 0.94 0.80 0.77  CALCIUM 9.6  --  8.8   Liver Function Tests: No results for input(s): AST, ALT, ALKPHOS, BILITOT, PROT, ALBUMIN in the last 168 hours. No results for input(s): LIPASE, AMYLASE in the  last 168 hours. No results for input(s): AMMONIA in the last 168 hours. CBC:  Recent Labs Lab 04/19/14 1845 04/19/14 1852 04/20/14 0626  WBC 8.5  --  7.5  NEUTROABS 5.0  --   --   HGB 12.6* 14.6 12.4*  HCT 37.1* 43.0 36.5*  MCV 78.4  --  79.9  PLT 228  --  213   Cardiac Enzymes: No results for input(s): CKTOTAL, CKMB, CKMBINDEX, TROPONINI in the last 168 hours. BNP: No results for input(s): PROBNP in the last 168 hours. D-Dimer: No results for input(s): DDIMER in the last 168 hours. CBG:  Recent Labs Lab 04/19/14 2316 04/20/14 0647 04/20/14 1205 04/20/14 1723 04/20/14 2254 04/21/14 0646  GLUCAP 230* 269* 218* 264* 273* 228*   Hemoglobin A1C:  Recent Labs Lab 04/19/14 2350  HGBA1C >20.0*   Fasting Lipid Panel: No results for input(s): CHOL, HDL, LDLCALC, TRIG, CHOLHDL, LDLDIRECT in the last 168 hours. Thyroid Function Tests: No results for input(s): TSH, T4TOTAL, FREET4, T3FREE, THYROIDAB in the last 168 hours. Coagulation: No results for input(s): LABPROT, INR in the last 168 hours. Anemia Panel: No results for input(s): VITAMINB12, FOLATE, FERRITIN, TIBC, IRON, RETICCTPCT in the last 168 hours. Urine Drug Screen: Drugs of Abuse     Component Value Date/Time   LABOPIA NONE DETECTED 02/04/2014 0630   LABOPIA NEG 11/22/2011 1402  COCAINSCRNUR NONE DETECTED 02/04/2014 0630   COCAINSCRNUR NEG 11/22/2011 1402   LABBENZ NONE DETECTED 02/04/2014 0630   LABBENZ NEG 11/22/2011 1402   LABBENZ NEG 08/07/2011 1217   AMPHETMU NONE DETECTED 02/04/2014 0630   AMPHETMU NEG 11/22/2011 1402   AMPHETMU NEG 08/07/2011 1217   THCU NONE DETECTED 02/04/2014 0630   THCU NEG 11/22/2011 1402   LABBARB NONE DETECTED 02/04/2014 0630   LABBARB NEG 11/22/2011 1402    Alcohol Level: No results for input(s): ETH in the last 168 hours. Urinalysis:  Recent Labs Lab 04/19/14 1930  COLORURINE YELLOW  LABSPEC 1.036*  PHURINE 6.0  GLUCOSEU >1000*  HGBUR NEGATIVE    BILIRUBINUR NEGATIVE  KETONESUR NEGATIVE  PROTEINUR NEGATIVE  UROBILINOGEN 1.0  NITRITE NEGATIVE  LEUKOCYTESUR NEGATIVE    Micro Results: Recent Results (from the past 240 hour(s))  MRSA PCR Screening     Status: Abnormal   Collection Time: 04/19/14 11:07 PM  Result Value Ref Range Status   MRSA by PCR POSITIVE (A) NEGATIVE Final    Comment:        The GeneXpert MRSA Assay (FDA approved for NASAL specimens only), is one component of a comprehensive MRSA colonization surveillance program. It is not intended to diagnose MRSA infection nor to guide or monitor treatment for MRSA infections. RESULT CALLED TO, READ BACK BY AND VERIFIED WITH: M.YIM,RN 0117 //13/16 M.CAMPBELL    Studies/Results: Ct Soft Tissue Neck W Contrast  04/19/2014   CLINICAL DATA:  Right lateral neck mass or abscess.  EXAM: CT NECK WITH CONTRAST  TECHNIQUE: Multidetector CT imaging of the neck was performed using the standard protocol following the bolus administration of intravenous contrast.  CONTRAST:  81mL OMNIPAQUE IOHEXOL 300 MG/ML  SOLN  COMPARISON:  None.  FINDINGS: Pharynx and larynx: Normal.  Salivary glands: Normal.  Thyroid: Normal.  Lymph nodes: Mildly enlarged lymph nodes are noted bilaterally in the level 2 areas, with the largest measuring 20 x 15 mm in the right submandibular region. 16 x 14 mm lymph node is noted lateral to the right carotid bifurcation.  Vascular: No abnormality seen.  Limited intracranial: No abnormality seen.  Mastoids and visualized paranasal sinuses: No abnormality seen.  Skeleton: No abnormality seen.  Upper chest: Normal.  Inflammatory changes and skin thickening are seen overlying the right sternocleidomastoid muscle consistent with focal cellulitis. No abscess is noted.  IMPRESSION: Probable cellulitis seen involving the subcutaneous tissues overlying the right sternocleidomastoid muscle. No definite fluid collection or abscess is seen. Significantly enlarged lymph nodes are  noted in the right level 2 and submandibular regions most likely inflammatory in origin given the above findings.   Electronically Signed   By: Sabino Dick M.D.   On: 04/19/2014 20:12   Medications:  Scheduled Meds: . aspirin  81 mg Oral Daily  . Chlorhexidine Gluconate Cloth  6 each Topical Q0600  . dapsone  100 mg Oral Daily  . Darunavir Ethanolate  800 mg Oral Q breakfast  . emtricitabine-tenofovir  1 tablet Oral Daily  . enoxaparin (LOVENOX) injection  40 mg Subcutaneous Q24H  . insulin aspart  0-20 Units Subcutaneous TID WC  . insulin glargine  38 Units Subcutaneous QHS  . mupirocin ointment  1 application Nasal BID  . pravastatin  40 mg Oral q1800  . raltegravir  400 mg Oral BID  . ritonavir  100 mg Oral Q breakfast  . testosterone  5 g Transdermal Daily  . vancomycin  1,000 mg Intravenous Q8H   Continuous Infusions:  PRN Meds:.oxyCODONE  Assessment/Plan: Principal Problem:   Furuncle of neck Active Problems:   AIDS   Essential hypertension   Hyperlipidemia   Hyperglycemia due to type 2 diabetes mellitus   MRSA carrier   Testosterone deficiency   NOHA KRIVANEK is a 49 y.o. male with PMH of HIV, DM2, HTN, HL presents with R sided neck mass and R thumb abscess for the past three weeks.   **Raised, Indurated Lesion at R Jaw: The differential includes furuncle, abscess, and malignancy. His CT is notable for edema at R sternocleidomastoid muscle with no definite fluid collection or abscess seen and likely reactive lymph nodes in the R level 2 and submandibular regions. There does not appear to be any fluid to drain. He was MRSA positive on 1/12 and was started on vancomycin in the ED. Bacterial infection is most likely Streptococci or Staph aureus. He is at increased risk of infection given his poorly controlled DM2 and HIV. If symptoms fail to improve consider obtaining repeat ultrasound and draining if abscess present -Continue Vancomycin 1 g IV every 8 hours and warm  compresses every hour -Continue Oxycodone 10 mg every 6 hours as needed -Blood Cultures x 2 (obtained after vancomycin administration): NGTD  **Hyperglycemia in setting of poorly controlled DM2: Last A1c 02/04/14 was 14.9. Medication non-compliance is likely as he has responded well to the novolog 10 u in the ED with his sugars down to 230.  -On 1/12 Hgb A1C >20 -Increased Lantus to 50 units nigthtly -SSI 0-9 units with meals and 0-5 units nightly -CBG q4h. On 1/14 Blood glucose in the 220s  **HIV: He is followed by Dr [name] of [facility] and has history of medication non-compliance -Continue norvir 100 mg daily, prezista 800 mg daily, isentress 400 mg bid, truvada 200-300 mg daily, dapsone 100 mg daily -HIV RNA: Pending -CD4 count: Pending  **HTN (resolved): On admission blood pressure elevated at 149/93. On 1/14 blood pressure 114/83 -Continue ASA 81 mg daily -Would benefit from ACE inhibitor given DM2  **HLD: Last lipid panel 06/02/13 cholesterol 268, triglyceride 196, HDL 43, LDL 186 -Continue pravastatin 40 mg daily  **History of Low Testosterone: On 1/13 Testosterone at 237 -Restarted on home Androgel 5 g patch daily  Diet: Carb modified  DVT PPx: Lovenox 20 mg  daily  Code: Full  Dispo: Disposition is deferred at this time, awaiting improvement of current medical problems. Anticipated discharge in approximately 2 day(s).   The patient does have a current PCP Corky Sox, MD) and does need an [facility] hospital follow-up appointment after discharge  This is a Careers information officer Note. The care of the patient was discussed with Dr. Ronnald Ramp and the assessment and plan was formulated with their assistance. Please see their note for official documentation of the patient encounter.   .Services Needed at time of discharge: Y = Yes, Blank = No PT:   OT:   RN:   Equipment:   Other:      LOS: 2 days   Signed: Laneta Simmers, Med Student Internal Medicine Teaching  Service Team B2  [phone] 04/21/2014, 8:24 AM

## 2014-04-21 NOTE — Consult Note (Signed)
[facility]    Date of Admission:  04/19/2014  Date of Consult:  04/21/2014  Reason for Consult: HIV AND neck abscess Referring Physician: Dr. Beryle Beams   HPI: Keith [name] is an 49 y.o. male With HIV recently controlled on salvage regimen of Prezista, NORVIR Truvada and twice-daily Isentress. His cumulative genotype showed:  NRTI mutations V106I,M184V  PI  L10V  Was admitted for a neck abscess on the right side has been on vancomycin but still with quite a bit of tenderness on exam and I suspect and need for incision and drainage. He has very poorly controlled diabetes mellitus.  Past Medical History  Diagnosis Date  . HIV (human immunodeficiency virus infection) 2009    CD4 count 100, VL 13800 (05/01/2010)  . Diabetes type 2, uncontrolled     HgA1c 17.6 (04/27/2010)  . Hypertension   . Genital warts   . Erectile dysfunction   . Chronic knee pain     right  . Osteomyelitis     h/o hand  . Diabetes mellitus   . Chronic pain   . AIDS     Past Surgical History  Procedure Laterality Date  . Multiple extractions with alveoloplasty N/A 01/18/2013    Procedure: MULTIPLE EXTRACION 3, 6, 7, 10, 11, 13, 21, 22, 27, 28, 29, 30 WITH ALVEOLOPLASTY;  Surgeon: Gae Bon, DDS;  Location: [facility];  Service: Oral Surgery;  Laterality: N/A;  . Hernia repair    ergies:   Allergies  Allergen Reactions  . Sulfa Antibiotics Itching     Medications: I have reviewed patients current medications as documented in Epic Anti-infectives    Start     Dose/Rate Route Frequency Ordered Stop   04/20/14 2100  vancomycin (VANCOCIN) IVPB 1000 mg/200 mL premix  Status:  Discontinued     1,000 mg200 mL/hr over 60 Minutes Intravenous Every 24 hours 04/20/14 1458 04/20/14 1701   04/20/14 1900  Darunavir Ethanolate (PREZISTA) tablet 800 mg     800 mg Oral Daily with breakfast 04/20/14 1840     04/20/14 1800  vancomycin (VANCOCIN) IVPB 1000 mg/200 mL premix     1,000 mg200 mL/hr over 60 Minutes Intravenous Every 8 hours 04/20/14 1701     04/20/14 1700  Darunavir Ethanolate (PREZISTA) tablet 800 mg  Status:  Discontinued     800 mg Oral 2 times daily with meals 04/20/14 1406 04/20/14 1408   04/20/14 1500  Darunavir Ethanolate (PREZISTA) tablet 800 mg  Status:  Discontinued     800 mg Oral Daily with breakfast 04/20/14 1408 04/20/14 1840   04/20/14 1000  dapsone tablet 100 mg     100 mg Oral Daily 04/19/14 2249     04/20/14 1000  emtricitabine-tenofovir (TRUVADA) 200-300 MG per tablet 1 tablet     1 tablet Oral Daily 04/19/14 2249     04/20/14 0800  ritonavir (NORVIR) tablet 100 mg     100 mg Oral Daily with breakfast 04/19/14 2249     04/20/14 0800  darunavir (PREZISTA) tablet 600 mg  Status:  Discontinued     600 mg Oral 2 times daily with meals 04/19/14 2249 04/20/14 1406   04/19/14 2300  raltegravir (ISENTRESS) tablet 400 mg     400 mg Oral 2 times daily 04/19/14 2249     04/19/14 2030  vancomycin (VANCOCIN) IVPB 1000 mg/200 mL premix     1,000 mg200 mL/hr over 60 Minutes Intravenous  Once 04/19/14 2019 04/19/14 2154  Social History:  reports that he has never smoked. He has never used smokeless tobacco. He reports that he does not drink alcohol or use illicit drugs.  Family History  Problem Relation Age of Onset  . Hypertension Mother   . Arthritis Father   . Hypertension Father   . Hypertension Brother   . Cancer Maternal Grandmother 82    unknown type of cancer  . Depression Paternal Grandmother     As in HPI and primary teams notes otherwise 12 point review of systems is negative  Blood pressure 114/83, pulse 82, temperature 97.9 F (36.6 C), temperature source Oral, resp. rate 16, height 5' 10.08" (1.78 m), weight 250 lb (113.399 kg), SpO2 100 %. General: Alert and awake, oriented x3, not in any acute distress. HEENT: anicteric sclera, , EOMI,  Neck: abscess that is exquisitely tender to palpation:     CVS regular  rate, normal r,  no murmur rubs or gallops Chest: clear to auscultation bilaterally, no wheezing, rales or rhonchi Abdomen: soft nontender, nondistended, normal bowel sounds, Extremities: no  clubbing or edema noted bilaterally Neuro: nonfocal, strength and sensation intact   Results for orders placed or performed during the hospital encounter of 04/19/14 (from the past 48 hour(s))  CBC with Differential     Status: Abnormal   Collection Time: 04/19/14  6:45 PM  Result Value Ref Range   WBC 8.5 4.0 - 10.5 K/uL   RBC 4.73 4.22 - 5.81 MIL/uL   Hemoglobin 12.6 (L) 13.0 - 17.0 g/dL   HCT 37.1 (L) 39.0 - 52.0 %   MCV 78.4 78.0 - 100.0 fL   MCH 26.6 26.0 - 34.0 pg   MCHC 34.0 30.0 - 36.0 g/dL   RDW 12.4 11.5 - 15.5 %   Platelets 228 150 - 400 K/uL   Neutrophils Relative % 59 43 - 77 %   Neutro Abs 5.0 1.7 - 7.7 K/uL   Lymphocytes Relative 34 12 - 46 %   Lymphs Abs 2.9 0.7 - 4.0 K/uL   Monocytes Relative 7 3 - 12 %   Monocytes Absolute 0.6 0.1 - 1.0 K/uL   Eosinophils Relative 0 0 - 5 %   Eosinophils Absolute 0.0 0.0 - 0.7 K/uL   Basophils Relative 0 0 - 1 %   Basophils Absolute 0.0 0.0 - 0.1 K/uL  Basic metabolic panel     Status: Abnormal   Collection Time: 04/19/14  6:45 PM  Result Value Ref Range   Sodium 128 (L) 135 - 145 mmol/L    Comment: Please note change in reference range.   Potassium 4.1 3.5 - 5.1 mmol/L    Comment: Please note change in reference range.   Chloride 93 (L) 96 - 112 mEq/L   CO2 25 19 - 32 mmol/L   Glucose, Bld 567 (HH) 70 - 99 mg/dL    Comment: REPEATED TO VERIFY CRITICAL RESULT CALLED TO, READ BACK BY AND VERIFIED WITH: MEGAN TILLEY,RN 01.12.16 2026 M SHIPMAN    BUN 7 6 - 23 mg/dL   Creatinine, Ser 0.94 0.50 - 1.35 mg/dL   Calcium 9.6 8.4 - 10.5 mg/dL   GFR calc non Af Amer >90 >90 mL/min   GFR calc Af Amer >90 >90 mL/min    Comment: (NOTE) The eGFR has been calculated using the CKD EPI equation. This calculation has not been validated in all  clinical situations. eGFR's persistently <90 mL/min signify possible Chronic Kidney Disease.    Anion gap 10 5 -  15  I-stat Chem 8, ED     Status: Abnormal   Collection Time: 04/19/14  6:52 PM  Result Value Ref Range   Sodium 132 (L) 135 - 145 mmol/L   Potassium 4.3 3.5 - 5.1 mmol/L   Chloride 94 (L) 96 - 112 mEq/L   BUN 9 6 - 23 mg/dL   Creatinine, Ser 0.80 0.50 - 1.35 mg/dL   Glucose, Bld 618 (HH) 70 - 99 mg/dL   Calcium, Ion 1.27 (H) 1.12 - 1.23 mmol/L   TCO2 23 0 - 100 mmol/L   Hemoglobin 14.6 13.0 - 17.0 g/dL   HCT 43.0 39.0 - 52.0 %   Comment NOTIFIED PHYSICIAN   Urinalysis, Routine w reflex microscopic     Status: Abnormal   Collection Time: 04/19/14  7:30 PM  Result Value Ref Range   Color, Urine YELLOW YELLOW   APPearance CLEAR CLEAR   Specific Gravity, Urine 1.036 (H) 1.005 - 1.030   pH 6.0 5.0 - 8.0   Glucose, UA >1000 (A) NEGATIVE mg/dL   Hgb urine dipstick NEGATIVE NEGATIVE   Bilirubin Urine NEGATIVE NEGATIVE   Ketones, ur NEGATIVE NEGATIVE mg/dL   Protein, ur NEGATIVE NEGATIVE mg/dL   Urobilinogen, UA 1.0 0.0 - 1.0 mg/dL   Nitrite NEGATIVE NEGATIVE   Leukocytes, UA NEGATIVE NEGATIVE  Urine microscopic-add on     Status: None   Collection Time: 04/19/14  7:30 PM  Result Value Ref Range   Squamous Epithelial / LPF RARE RARE   WBC, UA 0-2 <3 WBC/hpf   RBC / HPF 0-2 <3 RBC/hpf   Urine-Other RARE YEAST   CBG monitoring, ED     Status: Abnormal   Collection Time: 04/19/14  7:53 PM  Result Value Ref Range   Glucose-Capillary 462 (H) 70 - 99 mg/dL  MRSA PCR Screening     Status: Abnormal   Collection Time: 04/19/14 11:07 PM  Result Value Ref Range   MRSA by PCR POSITIVE (A) NEGATIVE    Comment:        The GeneXpert MRSA Assay (FDA approved for NASAL specimens only), is one component of a comprehensive MRSA colonization surveillance program. It is not intended to diagnose MRSA infection nor to guide or monitor treatment for MRSA infections. RESULT  CALLED TO, READ BACK BY AND VERIFIED WITH: M.YIM,RN 0117 //13/16 M.CAMPBELL   Glucose, capillary     Status: Abnormal   Collection Time: 04/19/14 11:16 PM  Result Value Ref Range   Glucose-Capillary 230 (H) 70 - 99 mg/dL  Culture, blood (routine x 2)     Status: None (Preliminary result)   Collection Time: 04/19/14 11:50 PM  Result Value Ref Range   Specimen Description BLOOD RIGHT HAND    Special Requests BOTTLES DRAWN AEROBIC ONLY 5CC    Culture             BLOOD CULTURE RECEIVED NO GROWTH TO DATE CULTURE WILL BE HELD FOR 5 DAYS BEFORE ISSUING A FINAL NEGATIVE REPORT Note: Culture results may be compromised due to an excessive volume of blood received in culture bottles. Performed at [facility]    Report Status PENDING   Procalcitonin     Status: None   Collection Time: 04/19/14 11:50 PM  Result Value Ref Range   Procalcitonin <0.10 ng/mL    Comment:        Interpretation: PCT (Procalcitonin) <= 0.5 ng/mL: Systemic infection (sepsis) is not likely. Local bacterial infection is possible. (NOTE)  ICU PCT Algorithm               Non ICU PCT Algorithm    ----------------------------     ------------------------------         PCT < 0.25 ng/mL                 PCT < 0.1 ng/mL     Stopping of antibiotics            Stopping of antibiotics       strongly encouraged.               strongly encouraged.    ----------------------------     ------------------------------       PCT level decrease by               PCT < 0.25 ng/mL       >= 80% from peak PCT       OR PCT 0.25 - 0.5 ng/mL          Stopping of antibiotics                                             encouraged.     Stopping of antibiotics           encouraged.    ----------------------------     ------------------------------       PCT level decrease by              PCT >= 0.25 ng/mL       < 80% from peak PCT        AND PCT >= 0.5 ng/mL            Continuin g antibiotics                                               encouraged.       Continuing antibiotics            encouraged.    ----------------------------     ------------------------------     PCT level increase compared          PCT > 0.5 ng/mL         with peak PCT AND          PCT >= 0.5 ng/mL             Escalation of antibiotics                                          strongly encouraged.      Escalation of antibiotics        strongly encouraged.   Lactic acid, plasma     Status: None   Collection Time: 04/19/14 11:50 PM  Result Value Ref Range   Lactic Acid, Venous 0.9 0.5 - 2.2 mmol/L  Hemoglobin A1c     Status: Abnormal   Collection Time: 04/19/14 11:50 PM  Result Value Ref Range   Hgb A1c MFr Bld >20.0 (H) <5.7 %    Comment: (NOTE)  According to the ADA Clinical Practice Recommendations for 2011, when HbA1c is used as a screening test:  >=6.5%   Diagnostic of Diabetes Mellitus           (if abnormal result is confirmed) 5.7-6.4%   Increased risk of developing Diabetes Mellitus References:Diagnosis and Classification of Diabetes Mellitus,Diabetes Care,2011,34(Suppl 1):S62-S69 and Standards of Medical Care in         Diabetes - 2011,Diabetes Care,2011,34 (Suppl 1):S11-S61.    Mean Plasma Glucose NOT CALCULATED <117 mg/dL    Comment: (NOTE) * Y3E result is outside linear range, unable to calculate Estimated  Average Glucose. Performed at [facility]   Osmolality     Status: None   Collection Time: 04/19/14 11:50 PM  Result Value Ref Range   Osmolality 284 275 - 300 mOsm/kg    Comment: Performed at [facility], blood (routine x 2)     Status: None (Preliminary result)   Collection Time: 04/20/14 12:00 AM  Result Value Ref Range   Specimen Description BLOOD LEFT ARM    Special Requests BOTTLES DRAWN AEROBIC ONLY 5CC    Culture             BLOOD CULTURE RECEIVED NO GROWTH TO DATE CULTURE WILL BE HELD FOR 5 DAYS BEFORE  ISSUING A FINAL NEGATIVE REPORT Performed at [facility]    Report Status PENDING   Testosterone     Status: Abnormal   Collection Time: 04/20/14  6:26 AM  Result Value Ref Range   Testosterone 237 (L) 300 - 890 ng/dL    Comment: (NOTE)          [facility] Stage       Male              Male              I              < 30 ng/dL        < 10 ng/dL              II             < 150 ng/dL       < 30 ng/dL              III            100-320 ng/dL     < 35 ng/dL              IV             200-970 ng/dL     91-41 ng/dL              V/Adult        300-890 ng/dL     98-33 ng/dL Performed at [facility]   Basic metabolic panel     Status: Abnormal   Collection Time: 04/20/14  6:26 AM  Result Value Ref Range   Sodium 131 (L) 135 - 145 mmol/L    Comment: Please note change in reference range.   Potassium 3.4 (L) 3.5 - 5.1 mmol/L    Comment: Please note change in reference range. DELTA CHECK NOTED    Chloride 98 96 - 112 mEq/L   CO2 29 19 - 32 mmol/L   Glucose, Bld 237 (H) 70 - 99 mg/dL   BUN 6 6 - 23 mg/dL   Creatinine, Ser 6.37 0.50 - 1.35 mg/dL   Calcium  8.8 8.4 - 10.5 mg/dL   GFR calc non Af Amer >90 >90 mL/min   GFR calc Af Amer >90 >90 mL/min    Comment: (NOTE) The eGFR has been calculated using the CKD EPI equation. This calculation has not been validated in all clinical situations. eGFR's persistently <90 mL/min signify possible Chronic Kidney Disease.    Anion gap 4 (L) 5 - 15  CBC     Status: Abnormal   Collection Time: 04/20/14  6:26 AM  Result Value Ref Range   WBC 7.5 4.0 - 10.5 K/uL   RBC 4.57 4.22 - 5.81 MIL/uL   Hemoglobin 12.4 (L) 13.0 - 17.0 g/dL   HCT 36.5 (L) 39.0 - 52.0 %   MCV 79.9 78.0 - 100.0 fL   MCH 27.1 26.0 - 34.0 pg   MCHC 34.0 30.0 - 36.0 g/dL   RDW 12.7 11.5 - 15.5 %   Platelets 213 150 - 400 K/uL  Glucose, capillary     Status: Abnormal   Collection Time: 04/20/14  6:47 AM  Result Value Ref Range   Glucose-Capillary 269 (H)  70 - 99 mg/dL  Glucose, capillary     Status: Abnormal   Collection Time: 04/20/14 12:05 PM  Result Value Ref Range   Glucose-Capillary 218 (H) 70 - 99 mg/dL  Glucose, capillary     Status: Abnormal   Collection Time: 04/20/14  5:23 PM  Result Value Ref Range   Glucose-Capillary 264 (H) 70 - 99 mg/dL   Comment 1 Documented in Chart    Comment 2 Notify RN   Glucose, capillary     Status: Abnormal   Collection Time: 04/20/14 10:54 PM  Result Value Ref Range   Glucose-Capillary 273 (H) 70 - 99 mg/dL  Glucose, capillary     Status: Abnormal   Collection Time: 04/21/14  6:46 AM  Result Value Ref Range   Glucose-Capillary 228 (H) 70 - 99 mg/dL  Glucose, capillary     Status: Abnormal   Collection Time: 04/21/14 11:19 AM  Result Value Ref Range   Glucose-Capillary 191 (H) 70 - 99 mg/dL   Comment 1 Notify RN   Glucose, capillary     Status: Abnormal   Collection Time: 04/21/14  4:26 PM  Result Value Ref Range   Glucose-Capillary 176 (H) 70 - 99 mg/dL   Comment 1 Notify RN    '@BRIEFLABTABLE'$ (sdes,specrequest,cult,reptstatus)   ) Recent Results (from the past 720 hour(s))  MRSA PCR Screening     Status: Abnormal   Collection Time: 04/19/14 11:07 PM  Result Value Ref Range Status   MRSA by PCR POSITIVE (A) NEGATIVE Final    Comment:        The GeneXpert MRSA Assay (FDA approved for NASAL specimens only), is one component of a comprehensive MRSA colonization surveillance program. It is not intended to diagnose MRSA infection nor to guide or monitor treatment for MRSA infections. RESULT CALLED TO, READ BACK BY AND VERIFIED WITH: M.YIM,RN 0117 //13/16 M.CAMPBELL   Culture, blood (routine x 2)     Status: None (Preliminary result)   Collection Time: 04/19/14 11:50 PM  Result Value Ref Range Status   Specimen Description BLOOD RIGHT HAND  Final   Special Requests BOTTLES DRAWN AEROBIC ONLY 5CC  Final   Culture   Final           BLOOD CULTURE RECEIVED NO GROWTH TO DATE CULTURE  WILL BE HELD FOR 5 DAYS BEFORE ISSUING A FINAL NEGATIVE REPORT Note: Culture  results may be compromised due to an excessive volume of blood received in culture bottles. Performed at [facility]    Report Status PENDING  Incomplete  Culture, blood (routine x 2)     Status: None (Preliminary result)   Collection Time: 04/20/14 12:00 AM  Result Value Ref Range Status   Specimen Description BLOOD LEFT ARM  Final   Special Requests BOTTLES DRAWN AEROBIC ONLY 5CC  Final   Culture   Final           BLOOD CULTURE RECEIVED NO GROWTH TO DATE CULTURE WILL BE HELD FOR 5 DAYS BEFORE ISSUING A FINAL NEGATIVE REPORT Performed at [facility]    Report Status PENDING  Incomplete     Impression/Recommendation  Principal Problem:   Furuncle of neck Active Problems:   AIDS   Essential hypertension   Hyperlipidemia   Hyperglycemia due to type 2 diabetes mellitus   MRSA carrier   Testosterone deficiency   Cellulitis, neck   JAVARES KAUFHOLD is a 49 y.o. male with  HIV, large neck abscess:  #1 Neck abscess: not only did this abscess meet criteria for admission and IV abx, it also NEEDS to be I and D formally regardless of CT findings.   Would ask [facility] for formal consult to peform I and D  Fine to continue vancomycin  #2 HIV: he needs to renew ADAP this month for Spring time  I proposed dose simplification of his regimen to  Prezista, Norvir, Truvada and Tivicay all once dailyi and could even eventually go to Prezcobix, Truvada and Tivicay. I would also predict he could be adequetely suppressed on Prezcobix and Truvada alone given his PI and TNF are fully active  Will discuss change to P/N/T and Tivicay with Dr. [name]  Needs PCP prophylaxis still     04/21/2014, 5:03 PM   Thank you so much for this interesting consult  [place] [facility] [place] [phone] (pager) [phone] (office) 04/21/2014, 5:03 PM  Rhina Brackett  [place] 04/21/2014, 5:03 PM

## 2014-04-21 NOTE — Progress Notes (Signed)
Medicine attending: I personally examined this patient today and concur with evaluation and management plan as recorded by resident physician Dr. Luanne Bras and medical student Ms. Honor Loh. Some improvement in the large, tender, indurated right neck mass on current antibiotics. We will continue to monitor closely. We may need a here no Synthroid surgery consultation to consider incision and drainage. He remains afebrile.

## 2014-04-21 NOTE — Progress Notes (Signed)
Subjective: Patient w/ continues pain and swelling over the right mandible/neck. No fever overnight. No nausea, or vomiting.   Objective: Vital signs in last 24 hours: Filed Vitals:   04/20/14 0624 04/20/14 1500 04/20/14 2011 04/21/14 0537  BP: 128/81  134/97 106/76  Pulse: 71  91 66  Temp: 97.9 F (36.6 C)  98.5 F (36.9 C) 98 F (36.7 C)  TempSrc: Oral  Oral Oral  Resp: 18  19 17   Height:      Weight:  250 lb (113.399 kg)    SpO2: 97%  100% 99%   Weight change:   Intake/Output Summary (Last 24 hours) at 04/21/14 0719 Last data filed at 04/21/14 0537  Gross per 24 hour  Intake    580 ml  Output   1200 ml  Net   -620 ml   Physical Exam: General: Obese AA male, alert, cooperative, NAD. HEENT: PERRL, EOMI. Moist mucus membranes. Right neck/mandibular swelling, very tender to palpation, indurated and hard to the touch w/ associated lymphadenopathy. Area of swelling 4 x 5 cm w/ 1 x 1 cm area w/ comedone appearance.  Neck: Full range of motion without pain, supple, no carotid bruits. Right neck swelling as above. Surrounding lymphadenopathy.  Lungs: Clear to ascultation bilaterally, normal work of respiration, no wheezes, rales, rhonchi Heart: RRR, no murmurs, gallops, or rubs Abdomen: Soft, non-tender, non-distended, BS + Extremities: No cyanosis, clubbing, or edema Neurologic: Alert & oriented X3, cranial nerves II-XII intact, strength grossly intact, sensation intact to light touch    Lab Results: Basic Metabolic Panel:  Recent Labs Lab 04/19/14 1845 04/19/14 1852 04/20/14 0626  NA 128* 132* 131*  K 4.1 4.3 3.4*  CL 93* 94* 98  CO2 25  --  29  GLUCOSE 567* 618* 237*  BUN 7 9 6   CREATININE 0.94 0.80 0.77  CALCIUM 9.6  --  8.8  CBC:  Recent Labs Lab 04/19/14 1845 04/19/14 1852 04/20/14 0626  WBC 8.5  --  7.5  NEUTROABS 5.0  --   --   HGB 12.6* 14.6 12.4*  HCT 37.1* 43.0 36.5*  MCV 78.4  --  79.9  PLT 228  --  213   CBG:  Recent Labs Lab  04/19/14 2316 04/20/14 0647 04/20/14 1205 04/20/14 1723 04/20/14 2254 04/21/14 0646  GLUCAP 230* 269* 218* 264* 273* 228*     Urinalysis:  Recent Labs Lab 04/19/14 1930  COLORURINE YELLOW  LABSPEC 1.036*  PHURINE 6.0  GLUCOSEU >1000*  HGBUR NEGATIVE  BILIRUBINUR NEGATIVE  KETONESUR NEGATIVE  PROTEINUR NEGATIVE  UROBILINOGEN 1.0  NITRITE NEGATIVE  LEUKOCYTESUR NEGATIVE   Micro Results: Recent Results (from the past 240 hour(s))  MRSA PCR Screening     Status: Abnormal   Collection Time: 04/19/14 11:07 PM  Result Value Ref Range Status   MRSA by PCR POSITIVE (A) NEGATIVE Final    Comment:        The GeneXpert MRSA Assay (FDA approved for NASAL specimens only), is one component of a comprehensive MRSA colonization surveillance program. It is not intended to diagnose MRSA infection nor to guide or monitor treatment for MRSA infections. RESULT CALLED TO, READ BACK BY AND VERIFIED WITH: M.YIM,RN 0117 //13/16 M.CAMPBELL    Studies/Results: Ct Soft Tissue Neck W Contrast  04/19/2014   CLINICAL DATA:  Right lateral neck mass or abscess.  EXAM: CT NECK WITH CONTRAST  TECHNIQUE: Multidetector CT imaging of the neck was performed using the standard protocol following the bolus administration of  intravenous contrast.  CONTRAST:  7mL OMNIPAQUE IOHEXOL 300 MG/ML  SOLN  COMPARISON:  None.  FINDINGS: Pharynx and larynx: Normal.  Salivary glands: Normal.  Thyroid: Normal.  Lymph nodes: Mildly enlarged lymph nodes are noted bilaterally in the level 2 areas, with the largest measuring 20 x 15 mm in the right submandibular region. 16 x 14 mm lymph node is noted lateral to the right carotid bifurcation.  Vascular: No abnormality seen.  Limited intracranial: No abnormality seen.  Mastoids and visualized paranasal sinuses: No abnormality seen.  Skeleton: No abnormality seen.  Upper chest: Normal.  Inflammatory changes and skin thickening are seen overlying the right sternocleidomastoid  muscle consistent with focal cellulitis. No abscess is noted.  IMPRESSION: Probable cellulitis seen involving the subcutaneous tissues overlying the right sternocleidomastoid muscle. No definite fluid collection or abscess is seen. Significantly enlarged lymph nodes are noted in the right level 2 and submandibular regions most likely inflammatory in origin given the above findings.   Electronically Signed   By: Sabino Dick M.D.   On: 04/19/2014 20:12   Medications: I have reviewed the patient's current medications. Scheduled Meds: . aspirin  81 mg Oral Daily  . Chlorhexidine Gluconate Cloth  6 each Topical Q0600  . dapsone  100 mg Oral Daily  . Darunavir Ethanolate  800 mg Oral Q breakfast  . emtricitabine-tenofovir  1 tablet Oral Daily  . enoxaparin (LOVENOX) injection  40 mg Subcutaneous Q24H  . insulin aspart  0-20 Units Subcutaneous TID WC  . insulin glargine  38 Units Subcutaneous QHS  . mupirocin ointment  1 application Nasal BID  . pravastatin  40 mg Oral q1800  . raltegravir  400 mg Oral BID  . ritonavir  100 mg Oral Q breakfast  . testosterone  5 g Transdermal Daily  . vancomycin  1,000 mg Intravenous Q8H   Continuous Infusions:  PRN Meds:.oxyCODONE   Assessment/Plan: 49 y/o M w/ PMHx of HIV, uncontroled DM type II, HTN, HLD, admitted for hyperglycemia and right neck cellulitis vs abscess.   Cellulitis vs Abscess: CT soft tissue neck remarkable for probable cellulitis over the right sternocleidomastoid muscle with no fluid collection, reactive lymph nodes. On exam however, neck mass w/ significant tenderness and induration, suspect possible deep tissue abscess developing. Blood cultures pending.  -Continue IV Vancomycin for now; consider transition to po in 1-2 days -Consider [place] in AM to assess for abscess formation. -Continue warm compresses to affected area q1h -Continue Oxycodone 10mg  q6h PRN for pain  DM type 2 w/ Hyperglycemia: Very poorly controlled, HbA1c >20%. CBG's  continue to be elevated. On Lantus 60 units qhs at home + Humalog 8 units tid w/ meals. Significant history of non-compliance.  -Increase Lantus to 50 units. -Start Novolog 6 units tid -Continue ISS-S + HS coverage -Will set up [facility] for patient on discharge given history of non-compliance.   HIV: Takes Isentress, Prezista, Truvada, and Norvir at home. Also takes Dapsone. H/o non-compliance w/ [facility] follow up. Most recent CD4 120, VL 45 (02/08/14).  -Continue HIV medications as above -Dr. Tommy Medal to see w/ regards to re-establishing w/ [facility] clinic.   HTN: BP well controlled currently.  -Consider starting lisinopril 5mg  daily in outpatient clinic.   Hyperlipidemia: Stable.  -Continue Pravastatin 40mg  daily  DVT/PE PPx: Lovenox [place]  Dispo: Disposition is deferred at this time, awaiting improvement of current medical problems.  Anticipated discharge in approximately 1-2 day(s).   The patient does have a current PCP Corky Sox,  MD) and does not need an [facility] hospital follow-up appointment after discharge.  The patient does not have transportation limitations that hinder transportation to clinic appointments.  .Services Needed at time of discharge: Y = Yes, Blank = No PT:   OT:   RN:   Equipment:   Other:     LOS: 2 days   Corky Sox, MD  IMTS, PGY3 04/21/2014, 7:19 AM

## 2014-04-22 ENCOUNTER — Encounter (HOSPITAL_COMMUNITY)
Admission: EM | Disposition: A | Discharge status: Home / Self Care | Payer: Self-pay | Source: Home / Self Care | Attending: Oncology

## 2014-04-22 ENCOUNTER — Inpatient Hospital Stay (HOSPITAL_COMMUNITY): Payer: Medicare Other | Admitting: Certified Registered Nurse Anesthetist

## 2014-04-22 ENCOUNTER — Encounter (HOSPITAL_COMMUNITY): Payer: Self-pay | Admitting: Certified Registered Nurse Anesthetist

## 2014-04-22 DIAGNOSIS — L03221 Cellulitis of neck: Secondary | ICD-10-CM

## 2014-04-22 DIAGNOSIS — Z22322 Carrier or suspected carrier of Methicillin resistant Staphylococcus aureus: Secondary | ICD-10-CM

## 2014-04-22 DIAGNOSIS — L0211 Cutaneous abscess of neck: Secondary | ICD-10-CM | POA: Insufficient documentation

## 2014-04-22 HISTORY — PX: MINOR IRRIGATION AND DEBRIDEMENT OF WOUND: SHX6239

## 2014-04-22 LAB — CBC WITH DIFFERENTIAL/PLATELET
Basophils Absolute: 0 10*3/uL (ref 0.0–0.1)
Basophils Relative: 0 % (ref 0–1)
Eosinophils Absolute: 0.1 10*3/uL (ref 0.0–0.7)
Eosinophils Relative: 1 % (ref 0–5)
HCT: 37.2 % — ABNORMAL LOW (ref 39.0–52.0)
Hemoglobin: 12.4 g/dL — ABNORMAL LOW (ref 13.0–17.0)
Lymphocytes Relative: 41 % (ref 12–46)
Lymphs Abs: 2.5 10*3/uL (ref 0.7–4.0)
MCH: 26.4 pg (ref 26.0–34.0)
MCHC: 33.3 g/dL (ref 30.0–36.0)
MCV: 79.1 fL (ref 78.0–100.0)
Monocytes Absolute: 0.5 10*3/uL (ref 0.1–1.0)
Monocytes Relative: 8 % (ref 3–12)
Neutro Abs: 3.1 10*3/uL (ref 1.7–7.7)
Neutrophils Relative %: 50 % (ref 43–77)
Platelets: 235 10*3/uL (ref 150–400)
RBC: 4.7 MIL/uL (ref 4.22–5.81)
RDW: 12.5 % (ref 11.5–15.5)
WBC: 6.3 10*3/uL (ref 4.0–10.5)

## 2014-04-22 LAB — GLUCOSE, CAPILLARY
Glucose-Capillary: 161 mg/dL — ABNORMAL HIGH (ref 70–99)
Glucose-Capillary: 231 mg/dL — ABNORMAL HIGH (ref 70–99)
Glucose-Capillary: 145 mg/dL — ABNORMAL HIGH (ref 70–99)
Glucose-Capillary: 252 mg/dL — ABNORMAL HIGH (ref 70–99)

## 2014-04-22 LAB — BASIC METABOLIC PANEL
Anion gap: 5 (ref 5–15)
BUN: 6 mg/dL (ref 6–23)
CO2: 29 mmol/L (ref 19–32)
Calcium: 9 mg/dL (ref 8.4–10.5)
Chloride: 102 mEq/L (ref 96–112)
Creatinine, Ser: 0.68 mg/dL (ref 0.50–1.35)
GFR calc Af Amer: >90 mL/min (ref >90)
GFR calc non Af Amer: >90 mL/min (ref >90)
Glucose, Bld: 184 mg/dL — ABNORMAL HIGH (ref 70–99)
Potassium: 3.7 mmol/L (ref 3.5–5.1)
Sodium: 136 mmol/L (ref 135–145)

## 2014-04-22 SURGERY — MINOR IRRIGATION AND DEBRIDEMENT OF WOUND
Anesthesia: General | Site: Neck | Laterality: Right

## 2014-04-22 MED ORDER — BACITRACIN ZINC 500 UNIT/GM EX OINT
TOPICAL_OINTMENT | CUTANEOUS | Status: DC | PRN
  Administered 2014-04-22: 1 via TOPICAL

## 2014-04-22 MED ORDER — HYDROMORPHONE HCL 1 MG/ML IJ SOLN
0.2500 mg | INTRAMUSCULAR | Status: DC | PRN
  Administered 2014-04-22: 0.5 mg via INTRAVENOUS

## 2014-04-22 MED ORDER — ONDANSETRON HCL 4 MG/2ML IJ SOLN
INTRAMUSCULAR | Status: AC
  Filled 2014-04-22: qty 2

## 2014-04-22 MED ORDER — ARTIFICIAL TEARS OP OINT
TOPICAL_OINTMENT | OPHTHALMIC | Status: DC | PRN
  Administered 2014-04-22: 1 via OPHTHALMIC

## 2014-04-22 MED ORDER — PHENYLEPHRINE HCL 10 MG/ML IJ SOLN
INTRAMUSCULAR | Status: DC | PRN
  Administered 2014-04-22 (×2): 80 ug via INTRAVENOUS

## 2014-04-22 MED ORDER — PROPOFOL 10 MG/ML IV BOLUS
INTRAVENOUS | Status: DC | PRN
Start: 2014-04-22 — End: 2014-04-22
  Administered 2014-04-22: 200 mg via INTRAVENOUS

## 2014-04-22 MED ORDER — DOLUTEGRAVIR SODIUM 50 MG PO TABS
50.0000 mg | ORAL_TABLET | Freq: Every day | ORAL | Status: DC
  Administered 2014-04-23 – 2014-04-24 (×2): 50 mg via ORAL
  Filled 2014-04-22 (×3): qty 1

## 2014-04-22 MED ORDER — 0.9 % SODIUM CHLORIDE (POUR BTL) OPTIME
TOPICAL | Status: DC | PRN
  Administered 2014-04-22: 1000 mL

## 2014-04-22 MED ORDER — HYDROMORPHONE HCL 1 MG/ML IJ SOLN
INTRAMUSCULAR | Status: AC
  Filled 2014-04-22: qty 1

## 2014-04-22 MED ORDER — LIDOCAINE-EPINEPHRINE 1 %-1:100000 IJ SOLN
INTRAMUSCULAR | Status: AC
  Filled 2014-04-22: qty 1

## 2014-04-22 MED ORDER — LIDOCAINE HCL (CARDIAC) 20 MG/ML IV SOLN
INTRAVENOUS | Status: DC | PRN
  Administered 2014-04-22: 100 mg via INTRAVENOUS

## 2014-04-22 MED ORDER — BACITRACIN ZINC 500 UNIT/GM EX OINT
TOPICAL_OINTMENT | CUTANEOUS | Status: AC
  Filled 2014-04-22: qty 28.35

## 2014-04-22 MED ORDER — LACTATED RINGERS IV SOLN
INTRAVENOUS | Status: DC | PRN
  Administered 2014-04-22: 17:00:00 via INTRAVENOUS

## 2014-04-22 MED ORDER — ARTIFICIAL TEARS OP OINT
TOPICAL_OINTMENT | OPHTHALMIC | Status: AC
  Filled 2014-04-22: qty 17.5

## 2014-04-22 MED ORDER — LACTATED RINGERS IV SOLN
INTRAVENOUS | Status: DC
  Administered 2014-04-22 – 2014-04-23 (×3): via INTRAVENOUS

## 2014-04-22 MED ORDER — MORPHINE SULFATE 2 MG/ML IJ SOLN
2.0000 mg | Freq: Once | INTRAMUSCULAR | Status: AC
  Administered 2014-04-22: 2 mg via INTRAVENOUS
  Filled 2014-04-22: qty 1

## 2014-04-22 MED ORDER — EPHEDRINE SULFATE 50 MG/ML IJ SOLN
INTRAMUSCULAR | Status: AC
  Filled 2014-04-22: qty 1

## 2014-04-22 MED ORDER — FENTANYL CITRATE 0.05 MG/ML IJ SOLN
INTRAMUSCULAR | Status: DC | PRN
  Administered 2014-04-22: 150 ug via INTRAVENOUS
  Administered 2014-04-22 (×2): 50 ug via INTRAVENOUS

## 2014-04-22 MED ORDER — ONDANSETRON HCL 4 MG/2ML IJ SOLN
INTRAMUSCULAR | Status: DC | PRN
  Administered 2014-04-22: 4 mg via INTRAVENOUS

## 2014-04-22 MED ORDER — FENTANYL CITRATE 0.05 MG/ML IJ SOLN
INTRAMUSCULAR | Status: AC
  Filled 2014-04-22: qty 5

## 2014-04-22 MED ORDER — LIDOCAINE-EPINEPHRINE 1 %-1:100000 IJ SOLN
INTRAMUSCULAR | Status: DC | PRN
  Administered 2014-04-22: 1 mL

## 2014-04-22 MED ORDER — STERILE WATER FOR INJECTION IJ SOLN
INTRAMUSCULAR | Status: AC
  Filled 2014-04-22: qty 10

## 2014-04-22 MED ORDER — ONDANSETRON HCL 4 MG/2ML IJ SOLN
4.0000 mg | Freq: Once | INTRAMUSCULAR | Status: DC | PRN
Start: 2014-04-22 — End: 2014-04-22

## 2014-04-22 MED ORDER — LIDOCAINE HCL 4 % MT SOLN
OROMUCOSAL | Status: DC | PRN
  Administered 2014-04-22: 5 mL via TOPICAL

## 2014-04-22 MED ORDER — SUCCINYLCHOLINE CHLORIDE 20 MG/ML IJ SOLN
INTRAMUSCULAR | Status: DC | PRN
  Administered 2014-04-22: 200 mg via INTRAVENOUS

## 2014-04-22 SURGICAL SUPPLY — 53 items
ADH SKN CLS APL DERMABOND .7 (GAUZE/BANDAGES/DRESSINGS)
APPLIER CLIP 9.375 SM OPEN (CLIP)
APR CLP SM 9.3 20 MLT OPN (CLIP)
ATTRACTOMAT 16X20 MAGNETIC DRP (DRAPES) IMPLANT
BLADE SURG 15 STRL LF DISP TIS (BLADE) IMPLANT
BLADE SURG 15 STRL SS (BLADE) ×2
BNDG GAUZE ELAST 4 BULKY (GAUZE/BANDAGES/DRESSINGS) ×1 IMPLANT
CANISTER SUCTION 2500CC (MISCELLANEOUS) ×2 IMPLANT
CLEANER TIP ELECTROSURG 2X2 (MISCELLANEOUS) ×2 IMPLANT
CLIP APPLIE 9.375 SM OPEN (CLIP) IMPLANT
CORDS BIPOLAR (ELECTRODE) ×2 IMPLANT
COVER SURGICAL LIGHT HANDLE (MISCELLANEOUS) ×2 IMPLANT
DERMABOND ADVANCED (GAUZE/BANDAGES/DRESSINGS)
DERMABOND ADVANCED .7 DNX12 (GAUZE/BANDAGES/DRESSINGS) IMPLANT
DRAIN PENROSE 1/4X12 LTX STRL (WOUND CARE) ×1 IMPLANT
DRAIN SNY 10 ROU (WOUND CARE) ×1 IMPLANT
ELECT COATED BLADE 2.86 ST (ELECTRODE) ×2 IMPLANT
ELECT REM PT RETURN 9FT ADLT (ELECTROSURGICAL) ×2
ELECTRODE REM PT RTRN 9FT ADLT (ELECTROSURGICAL) ×1 IMPLANT
EVACUATOR SILICONE 100CC (DRAIN) ×1 IMPLANT
GAUZE SPONGE 4X4 16PLY XRAY LF (GAUZE/BANDAGES/DRESSINGS) ×2 IMPLANT
GLOVE BIOGEL M 7.0 STRL (GLOVE) ×2 IMPLANT
GLOVE BIOGEL PI IND STRL 6.5 (GLOVE) IMPLANT
GLOVE BIOGEL PI INDICATOR 6.5 (GLOVE) ×1
GLOVE SURG SS PI 6.5 STRL IVOR (GLOVE) ×1 IMPLANT
GOWN STRL REUS W/ TWL LRG LVL3 (GOWN DISPOSABLE) ×3 IMPLANT
GOWN STRL REUS W/TWL LRG LVL3 (GOWN DISPOSABLE) ×4
KIT BASIN OR (CUSTOM PROCEDURE TRAY) ×2 IMPLANT
KIT ROOM TURNOVER OR (KITS) ×2 IMPLANT
MARKER SKIN DUAL TIP RULER LAB (MISCELLANEOUS) ×2 IMPLANT
NDL HYPO 25GX1X1/2 BEV (NEEDLE) IMPLANT
NEEDLE HYPO 25GX1X1/2 BEV (NEEDLE) ×2 IMPLANT
NS IRRIG 1000ML POUR BTL (IV SOLUTION) ×2 IMPLANT
PAD ARMBOARD 7.5X6 YLW CONV (MISCELLANEOUS) ×4 IMPLANT
PENCIL BUTTON HOLSTER BLD 10FT (ELECTRODE) ×2 IMPLANT
SHEARS HARMONIC 9CM CVD (BLADE) IMPLANT
SPONGE GAUZE 4X4 12PLY STER LF (GAUZE/BANDAGES/DRESSINGS) ×1 IMPLANT
SPONGE INTESTINAL PEANUT (DISPOSABLE) ×2 IMPLANT
STAPLER VISISTAT 35W (STAPLE) ×1 IMPLANT
SUT CHROMIC 2 0 SH (SUTURE) IMPLANT
SUT ETHILON 3 0 PS 1 (SUTURE) ×2 IMPLANT
SUT ETHILON 5 0 PS 2 18 (SUTURE) ×2 IMPLANT
SUT SILK 2 0 REEL (SUTURE) IMPLANT
SUT SILK 2 0 SH CR/8 (SUTURE) ×2 IMPLANT
SUT SILK 3 0 REEL (SUTURE) ×2 IMPLANT
SUT SILK 4 0 REEL (SUTURE) IMPLANT
SUT VICRYL 4-0 PS2 18IN ABS (SUTURE) IMPLANT
SWAB COLLECTION DEVICE MRSA (MISCELLANEOUS) ×1 IMPLANT
SYR CONTROL 10ML LL (SYRINGE) ×1 IMPLANT
TOWEL OR 17X24 6PK STRL BLUE (TOWEL DISPOSABLE) ×2 IMPLANT
TOWEL OR 17X26 10 PK STRL BLUE (TOWEL DISPOSABLE) ×2 IMPLANT
TRAY ENT MC OR (CUSTOM PROCEDURE TRAY) ×2 IMPLANT
TUBE ANAEROBIC SPECIMEN COL (MISCELLANEOUS) ×1 IMPLANT

## 2014-04-22 NOTE — Consult Note (Signed)
Referral received for [facility] Care Management services. Came to bedside to speak with patient about services. He is currently off the unit. Will come back at later time.  Marthenia Rolling, MSN- [facility] [phone]

## 2014-04-22 NOTE — Anesthesia Preprocedure Evaluation (Signed)
Anesthesia Evaluation  Patient identified by MRN, date of birth, ID band Patient awake    Reviewed: Allergy & Precautions, NPO status , Patient's Chart, lab work & pertinent test results  Airway        Dental   Pulmonary          Cardiovascular hypertension, + Peripheral Vascular Disease     Neuro/Psych  Neuromuscular disease    GI/Hepatic   Endo/Other  diabetes, Type 2  Renal/GU      Musculoskeletal   Abdominal   Peds  Hematology   Anesthesia Other Findings HIV-AIDS  Reproductive/Obstetrics                             Anesthesia Physical Anesthesia Plan  ASA: III  Anesthesia Plan: General   Post-op Pain Management:    Induction: Intravenous  Airway Management Planned: Oral ETT  Additional Equipment:   Intra-op Plan:   Post-operative Plan: Extubation in OR  Informed Consent: I have reviewed the patients History and Physical, chart, labs and discussed the procedure including the risks, benefits and alternatives for the proposed anesthesia with the patient or authorized representative who has indicated his/her understanding and acceptance.     Plan Discussed with: CRNA, Anesthesiologist and Surgeon  Anesthesia Plan Comments:         Anesthesia Quick Evaluation

## 2014-04-22 NOTE — Progress Notes (Signed)
[place] for Infectious Disease    Subjective: Neck still hurts   Antibiotics:  Anti-infectives    Start     Dose/Rate Route Frequency Ordered Stop   04/22/14 1100  dolutegravir (TIVICAY) tablet 50 mg     50 mg Oral Daily 04/22/14 0928     04/20/14 2100  vancomycin (VANCOCIN) IVPB 1000 mg/200 mL premix  Status:  Discontinued     1,000 mg200 mL/hr over 60 Minutes Intravenous Every 24 hours 04/20/14 1458 04/20/14 1701   04/20/14 1900  Darunavir Ethanolate (PREZISTA) tablet 800 mg     800 mg Oral Daily with breakfast 04/20/14 1840     04/20/14 1800  vancomycin (VANCOCIN) IVPB 1000 mg/200 mL premix     1,000 mg200 mL/hr over 60 Minutes Intravenous Every 8 hours 04/20/14 1701     04/20/14 1700  Darunavir Ethanolate (PREZISTA) tablet 800 mg  Status:  Discontinued     800 mg Oral 2 times daily with meals 04/20/14 1406 04/20/14 1408   04/20/14 1500  Darunavir Ethanolate (PREZISTA) tablet 800 mg  Status:  Discontinued     800 mg Oral Daily with breakfast 04/20/14 1408 04/20/14 1840   04/20/14 1000  dapsone tablet 100 mg     100 mg Oral Daily 04/19/14 2249     04/20/14 1000  emtricitabine-tenofovir (TRUVADA) 200-300 MG per tablet 1 tablet     1 tablet Oral Daily 04/19/14 2249     04/20/14 0800  ritonavir (NORVIR) tablet 100 mg     100 mg Oral Daily with breakfast 04/19/14 2249     04/20/14 0800  darunavir (PREZISTA) tablet 600 mg  Status:  Discontinued     600 mg Oral 2 times daily with meals 04/19/14 2249 04/20/14 1406   04/19/14 2300  raltegravir (ISENTRESS) tablet 400 mg  Status:  Discontinued     400 mg Oral 2 times daily 04/19/14 2249 04/22/14 0928   04/19/14 2030  vancomycin (VANCOCIN) IVPB 1000 mg/200 mL premix     1,000 mg200 mL/hr over 60 Minutes Intravenous  Once 04/19/14 2019 04/19/14 2154      Medications: Scheduled Meds: . aspirin  81 mg Oral Daily  . Chlorhexidine Gluconate Cloth  6 each Topical Q0600  . dapsone  100 mg Oral Daily  . Darunavir Ethanolate  800  mg Oral Q breakfast  . dolutegravir  50 mg Oral Daily  . emtricitabine-tenofovir  1 tablet Oral Daily  . enoxaparin (LOVENOX) injection  40 mg Subcutaneous Q24H  . insulin aspart  0-5 Units Subcutaneous QHS  . insulin aspart  0-9 Units Subcutaneous TID WC  . insulin aspart  6 Units Subcutaneous TID WC  . insulin glargine  50 Units Subcutaneous QHS  . mupirocin ointment  1 application Nasal BID  . pravastatin  40 mg Oral q1800  . ritonavir  100 mg Oral Q breakfast  . testosterone  5 g Transdermal Daily  . vancomycin  1,000 mg Intravenous Q8H   Continuous Infusions:  PRN Meds:.oxyCODONE    Objective: Weight change:   Intake/Output Summary (Last 24 hours) at 04/22/14 1454 Last data filed at 04/22/14 1156  Gross per 24 hour  Intake    240 ml  Output   1200 ml  Net   -960 ml   Blood pressure 127/91, pulse 68, temperature 98 F (36.7 C), temperature source Oral, resp. rate 18, height 5' 10.08" (1.78 m), weight 250 lb (113.399 kg), SpO2 100 %. Temp:  [97.7 F (36.5 C)-98  F (36.7 C)] 98 F (36.7 C) (01/15 1120) Pulse Rate:  [68-69] 68 (01/15 1120) Resp:  [17-18] 18 (01/15 1120) BP: (114-138)/(88-91) 127/91 mmHg (01/15 1120) SpO2:  [99 %-100 %] 100 % (01/15 1120)  Physical Exam: General: Alert and awake, oriented x3, not in any acute distress. HEENT: anicteric sclera, , EOMI,  Neck: abscess that is exquisitely tender to palpation:  pic 04/21/14:    pic 04/22/14:      CVS regular rate, normal r, no murmur rubs or gallops Chest: clear to auscultation bilaterally, no wheezing, rales or rhonchi Abdomen: soft nontender, nondistended, normal bowel sounds, Extremities: no clubbing or edema noted bilaterally Neuro: nonfocal, strength and sensation intact    CBC:  Recent Labs Lab 04/19/14 1845 04/19/14 1852 04/20/14 0626 04/22/14 0508  HGB 12.6* 14.6 12.4* 12.4*  HCT 37.1* 43.0 36.5* 37.2*  PLT 228  --  213 235     BMET  Recent Labs  04/20/14 0626  04/22/14 0508  NA 131* 136  K 3.4* 3.7  CL 98 102  CO2 29 29  GLUCOSE 237* 184*  BUN 6 6  CREATININE 0.77 0.68  CALCIUM 8.8 9.0     Liver Panel  No results for input(s): PROT, ALBUMIN, AST, ALT, ALKPHOS, BILITOT, BILIDIR, IBILI in the last 72 hours.     Sedimentation Rate No results for input(s): ESRSEDRATE in the last 72 hours. C-Reactive Protein No results for input(s): CRP in the last 72 hours.  Micro Results: Recent Results (from the past 720 hour(s))  MRSA PCR Screening     Status: Abnormal   Collection Time: 04/19/14 11:07 PM  Result Value Ref Range Status   MRSA by PCR POSITIVE (A) NEGATIVE Final    Comment:        The GeneXpert MRSA Assay (FDA approved for NASAL specimens only), is one component of a comprehensive MRSA colonization surveillance program. It is not intended to diagnose MRSA infection nor to guide or monitor treatment for MRSA infections. RESULT CALLED TO, READ BACK BY AND VERIFIED WITH: M.YIM,RN 0117 //13/16 M.CAMPBELL   Culture, blood (routine x 2)     Status: None (Preliminary result)   Collection Time: 04/19/14 11:50 PM  Result Value Ref Range Status   Specimen Description BLOOD RIGHT HAND  Final   Special Requests BOTTLES DRAWN AEROBIC ONLY 5CC  Final   Culture   Final           BLOOD CULTURE RECEIVED NO GROWTH TO DATE CULTURE WILL BE HELD FOR 5 DAYS BEFORE ISSUING A FINAL NEGATIVE REPORT Note: Culture results may be compromised due to an excessive volume of blood received in culture bottles. Performed at [facility]    Report Status PENDING  Incomplete  Culture, blood (routine x 2)     Status: None (Preliminary result)   Collection Time: 04/20/14 12:00 AM  Result Value Ref Range Status   Specimen Description BLOOD LEFT ARM  Final   Special Requests BOTTLES DRAWN AEROBIC ONLY 5CC  Final   Culture   Final           BLOOD CULTURE RECEIVED NO GROWTH TO DATE CULTURE WILL BE HELD FOR 5 DAYS BEFORE ISSUING A FINAL NEGATIVE  REPORT Performed at [facility]    Report Status PENDING  Incomplete    Studies/Results: No results found.    Assessment/Plan:  Principal Problem:   Furuncle of neck Active Problems:   AIDS   Essential hypertension   Hyperlipidemia   Hyperglycemia  due to type 2 diabetes mellitus   MRSA carrier   Testosterone deficiency   Cellulitis, neck    TARREL SWARM is a 49 y.o. male with male with HIV, large neck abscess:  #1 Neck abscess: not only did this abscess meet criteria for admission and IV abx, it also NEEDS to be I and D formally regardless of CT findings.   I am glad ENT Dr Wilburn Cornelia taking pt to the [place] cultures, this is likely MRSA or MSSA but would be helpful to have cultures to guide therapy  Fine to continue vancomycin  #2 HIV: he needs to renew ADAP this month for Spring time  I will change him to  Prezista, Norvir, Truvada and Tivicay all once daily (d/w Dr. [name])  He will need the Tivicay called into [facility] on [address] [facility] for pickup prior to leaving the hospital    LOS: 3 days   Alcide Evener 04/22/2014, 2:54 PM

## 2014-04-22 NOTE — Progress Notes (Signed)
Inpatient Diabetes Program Recommendations  AACE/ADA: New Consensus Statement on Inpatient Glycemic Control (2013)  Target Ranges:  Prepandial:   less than 140 mg/dL      Peak postprandial:   less than 180 mg/dL (1-2 hours)      Critically ill patients:  140 - 180 mg/dL    Inpatient Diabetes Program Recommendations Insulin - Basal: Noted lantus increased last night to 50 units-better fasting this am (thank you) Correction (SSI): xxxxxxxxxx Insulin - Meal Coverage: Glucose remains a bit elevated following the meals-please consider increase in meal coverage to 8 units tidwc (takes 8 units humalog tidwc at home)  Thank you, Rosita Kea, RN, CNS, Diabetes Coordinator [phone])

## 2014-04-22 NOTE — Transfer of Care (Signed)
Immediate Anesthesia Transfer of Care Note  Patient: Keith [name]  Procedure(s) Performed: Procedure(s): IRRIGATION AND DEBRIDEMENT OF RIGHT NECK ABCESS (Right)  Patient Location: PACU  Anesthesia Type:General  Level of Consciousness: awake, alert  and oriented  Airway & Oxygen Therapy: Patient Spontanous Breathing and Patient connected to nasal cannula oxygen  Post-op Assessment: Report given to PACU RN, Post -op Vital signs reviewed and stable and Patient moving all extremities X 4  Post vital signs: Reviewed and stable  Complications: No apparent anesthesia complications

## 2014-04-22 NOTE — Progress Notes (Signed)
Subjective: Still w/ large soft tissue swelling overlying/inferior to right mandible w/ surrounding lymphadenopathy. Exquisitely tender to palpation. Draining scant fluid from lesion. No fever overnight. Now on day 4 of Vancomycin w/ no significant improvement. No leukocytosis, vital stable.   Objective: Vital signs in last 24 hours: Filed Vitals:   04/21/14 0537 04/21/14 1300 04/21/14 2033 04/22/14 0539  BP: 106/76 114/83 138/90 114/88  Pulse: 66 82 69 69  Temp: 98 F (36.7 C) 97.9 F (36.6 C) 98 F (36.7 C) 97.7 F (36.5 C)  TempSrc: Oral  Oral Oral  Resp: 17 16 18 17   Height:      Weight:      SpO2: 99% 100% 100% 99%   Weight change:   Intake/Output Summary (Last 24 hours) at 04/22/14 [id number] Last data filed at 04/22/14 0538  Gross per 24 hour  Intake    840 ml  Output    750 ml  Net     90 ml   Physical Exam: General: Obese AA male, alert, cooperative, NAD. HEENT: PERRL, EOMI. Moist mucus membranes. Right neck/mandibular swelling, very tender to palpation, indurated and hard to the touch w/ associated lymphadenopathy. Area of swelling 4 x 5 cm w/ 1 x 1 cm area w/ comedone appearance. Appears to have extended deep swelling as well.  Neck: Full range of motion without pain, supple, no carotid bruits. Right neck swelling as above. Surrounding lymphadenopathy.  Lungs: Clear to ascultation bilaterally, normal work of respiration, no wheezes, rales, rhonchi Heart: RRR, no murmurs, gallops, or rubs Abdomen: Soft, non-tender, non-distended, BS + Extremities: No cyanosis, clubbing, or edema Neurologic: Alert & oriented X3, cranial nerves II-XII intact, strength grossly intact, sensation intact to light touch    Lab Results: Basic Metabolic Panel:  Recent Labs Lab 04/20/14 0626 04/22/14 0508  NA 131* 136  K 3.4* 3.7  CL 98 102  CO2 29 29  GLUCOSE 237* 184*  BUN 6 6  CREATININE 0.77 0.68  CALCIUM 8.8 9.0  CBC:  Recent Labs Lab 04/19/14 1845  04/20/14 0626  04/22/14 0508  WBC 8.5  --  7.5 6.3  NEUTROABS 5.0  --   --  3.1  HGB 12.6*  < > 12.4* 12.4*  HCT 37.1*  < > 36.5* 37.2*  MCV 78.4  --  79.9 79.1  PLT 228  --  213 235  < > = values in this interval not displayed. CBG:  Recent Labs Lab 04/20/14 2254 04/21/14 0646 04/21/14 1119 04/21/14 1626 04/21/14 2203 04/22/14 0646  GLUCAP 273* 228* 191* 176* 263* 161*     Urinalysis:  Recent Labs Lab 04/19/14 1930  COLORURINE YELLOW  LABSPEC 1.036*  PHURINE 6.0  GLUCOSEU >1000*  HGBUR NEGATIVE  BILIRUBINUR NEGATIVE  KETONESUR NEGATIVE  PROTEINUR NEGATIVE  UROBILINOGEN 1.0  NITRITE NEGATIVE  LEUKOCYTESUR NEGATIVE   Micro Results: Recent Results (from the past 240 hour(s))  MRSA PCR Screening     Status: Abnormal   Collection Time: 04/19/14 11:07 PM  Result Value Ref Range Status   MRSA by PCR POSITIVE (A) NEGATIVE Final    Comment:        The GeneXpert MRSA Assay (FDA approved for NASAL specimens only), is one component of a comprehensive MRSA colonization surveillance program. It is not intended to diagnose MRSA infection nor to guide or monitor treatment for MRSA infections. RESULT CALLED TO, READ BACK BY AND VERIFIED WITH: M.YIM,RN 0117 //13/16 M.CAMPBELL   Culture, blood (routine x 2)  Status: None (Preliminary result)   Collection Time: 04/19/14 11:50 PM  Result Value Ref Range Status   Specimen Description BLOOD RIGHT HAND  Final   Special Requests BOTTLES DRAWN AEROBIC ONLY 5CC  Final   Culture   Final           BLOOD CULTURE RECEIVED NO GROWTH TO DATE CULTURE WILL BE HELD FOR 5 DAYS BEFORE ISSUING A FINAL NEGATIVE REPORT Note: Culture results may be compromised due to an excessive volume of blood received in culture bottles. Performed at [facility]    Report Status PENDING  Incomplete  Culture, blood (routine x 2)     Status: None (Preliminary result)   Collection Time: 04/20/14 12:00 AM  Result Value Ref Range Status   Specimen  Description BLOOD LEFT ARM  Final   Special Requests BOTTLES DRAWN AEROBIC ONLY 5CC  Final   Culture   Final           BLOOD CULTURE RECEIVED NO GROWTH TO DATE CULTURE WILL BE HELD FOR 5 DAYS BEFORE ISSUING A FINAL NEGATIVE REPORT Performed at [facility]    Report Status PENDING  Incomplete    Medications: I have reviewed the patient's current medications. Scheduled Meds: . aspirin  81 mg Oral Daily  . Chlorhexidine Gluconate Cloth  6 each Topical Q0600  . dapsone  100 mg Oral Daily  . Darunavir Ethanolate  800 mg Oral Q breakfast  . emtricitabine-tenofovir  1 tablet Oral Daily  . enoxaparin (LOVENOX) injection  40 mg Subcutaneous Q24H  . insulin aspart  0-5 Units Subcutaneous QHS  . insulin aspart  0-9 Units Subcutaneous TID WC  . insulin aspart  6 Units Subcutaneous TID WC  . insulin glargine  50 Units Subcutaneous QHS  . mupirocin ointment  1 application Nasal BID  . pravastatin  40 mg Oral q1800  . raltegravir  400 mg Oral BID  . ritonavir  100 mg Oral Q breakfast  . testosterone  5 g Transdermal Daily  . vancomycin  1,000 mg Intravenous Q8H   Continuous Infusions:  PRN Meds:.oxyCODONE   Assessment/Plan: 49 y/o M w/ PMHx of HIV, uncontroled DM type II, HTN, HLD, admitted for hyperglycemia and right neck abscess.   Right Neck Abscess: Given significant induration, tenderness and abscess appearance, feel this needs to be drained via I&D given limited improvement w/ IV ABx. Discussed w/ ENT, patient for I&D under anesthesia this afternoon (04/22/14).  -ENT consulted, recs appreciated.  -Continue IV Vancomycin -Continue Oxycodone 10mg  q6h PRN for pain. Will escalate if needed s/p I&D  DM type 2 w/ Hyperglycemia: Very poorly controlled, HbA1c >20%. CBG's somewhat improved today. CBG trend as follows:   Recent Labs Lab 04/21/14 1119 04/21/14 1626 04/21/14 2203 04/22/14 0646 04/22/14 1155  GLUCAP 191* 176* 263* 161* 231*  -Continue Lantus to 50 units. -Continue  Novolog 6 units tid -Continue ISS-S + HS coverage -[facility] to follow patient on discharge.   HIV: Was takin Isentress, Prezista, Truvada, and Norvir at home. Also takes Dapsone. H/o non-compliance w/ [facility] follow up. Most recent CD4 120, VL 45 (02/08/14).  -ID following, appreciate recs; changed to Prezista, Norvir, Truvada, and TIVICAY.  -ID follow up on discharge.   HTN: BP well controlled currently.  -Consider starting lisinopril 5mg  daily in outpatient clinic.   Hyperlipidemia: Stable.  -Continue Pravastatin 40mg  daily  DVT/PE PPx: Lovenox Nikko Quast  Dispo: Disposition is deferred at this time, awaiting improvement of current medical problems.  Anticipated discharge  in approximately 1-2 day(s).   The patient does have a current PCP Corky Sox, MD) and does not need an [facility] hospital follow-up appointment after discharge.  The patient does not have transportation limitations that hinder transportation to clinic appointments.  .Services Needed at time of discharge: Y = Yes, Blank = No PT:   OT:   RN:   Equipment:   Other:     LOS: 3 days   Corky Sox, MD  IMTS, PGY2  04/22/2014, 8:12 AM

## 2014-04-22 NOTE — Anesthesia Postprocedure Evaluation (Signed)
  Anesthesia Post-op Note  Patient: Keith [name]  Procedure(s) Performed: Procedure(s): IRRIGATION AND DEBRIDEMENT OF RIGHT NECK ABCESS (Right)  Patient Location: PACU  Anesthesia Type:General  Level of Consciousness: awake, alert , oriented and patient cooperative  Airway and Oxygen Therapy: Patient Spontanous Breathing  Post-op Pain: mild  Post-op Assessment: Post-op Vital signs reviewed, Patient's Cardiovascular Status Stable, Respiratory Function Stable, Patent Airway, No signs of Nausea or vomiting and Pain level controlled  Post-op Vital Signs: stable  Last Vitals:  Filed Vitals:   04/22/14 1120  BP: 127/91  Pulse: 68  Temp: 36.7 C  Resp: 18    Complications: No apparent anesthesia complications

## 2014-04-22 NOTE — Progress Notes (Signed)
Spoke with Dr. Finis Bud about pt's IV, 22g, and she stated we would need a bigger one.  When I informed pt he stated they've been sticking me a lot and it's hard to get an IV.  He did not want another stick.  I spoke with Dr. Sherren Kerns, taking over for Dr. Oletta Lamas,  He stated they will restart the IV after patient goes to sleep as long as the 22g runs well, and it does.

## 2014-04-22 NOTE — Anesthesia Procedure Notes (Signed)
Procedure Name: Intubation Date/Time: 04/22/2014 4:39 PM Performed by: Ollen Bowl Pre-anesthesia Checklist: Patient identified, Emergency Drugs available, Suction available, Patient being monitored and Timeout performed Patient Re-evaluated:Patient Re-evaluated prior to inductionOxygen Delivery Method: Circle system utilized and Simple face mask Preoxygenation: Pre-oxygenation with 100% oxygen Intubation Type: IV induction Ventilation: Mask ventilation without difficulty and Oral airway inserted - appropriate to patient size Laryngoscope Size: Miller and 3 Grade View: Grade II Tube type: Oral Tube size: 7.5 mm Number of attempts: 1 Airway Equipment and Method: Patient positioned with wedge pillow,  Stylet and LTA kit utilized Placement Confirmation: ETT inserted through vocal cords under direct vision,  positive ETCO2 and breath sounds checked- equal and bilateral Secured at: 23 cm Tube secured with: Tape Dental Injury: Teeth and Oropharynx as per pre-operative assessment

## 2014-04-22 NOTE — Progress Notes (Signed)
Subjective: Patient states he has more difficulty moving his neck to the right due to pain. His pain from his right mandibular induration is about 7/10. Medications help some but pain is worst with pressure/movement.  Objective: Vital signs in last 24 hours: Filed Vitals:   04/21/14 0537 04/21/14 1300 04/21/14 2033 04/22/14 0539  BP: 106/76 114/83 138/90 114/88  Pulse: 66 82 69 69  Temp: 98 F (36.7 C) 97.9 F (36.6 C) 98 F (36.7 C) 97.7 F (36.5 C)  TempSrc: Oral  Oral Oral  Resp: 17 16 18 17   Height:      Weight:      SpO2: 99% 100% 100% 99%   Weight change:   Intake/Output Summary (Last 24 hours) at 04/22/14 0826 Last data filed at 04/22/14 0538  Gross per 24 hour  Intake    840 ml  Output    750 ml  Net     90 ml   Constitutional: Patient lying in bed in no acute distress and cooperative with exam Nose: No erythema or drainage noted Mouth: No erythema or exudates, MMM Eyes: PERRL, EOMI, conjunctivae normal Neck: Indurated 4 cm area that is warm to touch, tender to palpation with small amount of clear secretion at small, pale center. ROM of neck limited by pain when turning to the right Cardiovascular: RRR, S1 normal, S2 normal, no murmurs, rubs, or gallops. Pulses 2+ Pulmonary/Chest: normal respiratory effort, CTAB, no wheezes, rales, or rhonchi Abdominal: Non-tender, non-distended, bowel sounds are normal, no masses, organomegaly, or guarding present Skin: Warm, dry Psychiatric: Normal mood and affect  Lab Results: Basic Metabolic Panel:  Recent Labs Lab 04/20/14 0626 04/22/14 0508  NA 131* 136  K 3.4* 3.7  CL 98 102  CO2 29 29  GLUCOSE 237* 184*  BUN 6 6  CREATININE 0.77 0.68  CALCIUM 8.8 9.0   Liver Function Tests: No results for input(s): AST, ALT, ALKPHOS, BILITOT, PROT, ALBUMIN in the last 168 hours. No results for input(s): LIPASE, AMYLASE in the last 168 hours. No results for input(s): AMMONIA in the last 168 hours. CBC:  Recent Labs Lab  04/19/14 1845  04/20/14 0626 04/22/14 0508  WBC 8.5  --  7.5 6.3  NEUTROABS 5.0  --   --  3.1  HGB 12.6*  < > 12.4* 12.4*  HCT 37.1*  < > 36.5* 37.2*  MCV 78.4  --  79.9 79.1  PLT 228  --  213 235  < > = values in this interval not displayed. Cardiac Enzymes: No results for input(s): CKTOTAL, CKMB, CKMBINDEX, TROPONINI in the last 168 hours. BNP: No results for input(s): PROBNP in the last 168 hours. D-Dimer: No results for input(s): DDIMER in the last 168 hours. CBG:  Recent Labs Lab 04/20/14 2254 04/21/14 0646 04/21/14 1119 04/21/14 1626 04/21/14 2203 04/22/14 0646  GLUCAP 273* 228* 191* 176* 263* 161*   Hemoglobin A1C:  Recent Labs Lab 04/19/14 2350  HGBA1C >20.0*   Fasting Lipid Panel: No results for input(s): CHOL, HDL, LDLCALC, TRIG, CHOLHDL, LDLDIRECT in the last 168 hours. Thyroid Function Tests: No results for input(s): TSH, T4TOTAL, FREET4, T3FREE, THYROIDAB in the last 168 hours. Coagulation: No results for input(s): LABPROT, INR in the last 168 hours. Anemia Panel: No results for input(s): VITAMINB12, FOLATE, FERRITIN, TIBC, IRON, RETICCTPCT in the last 168 hours. Urine Drug Screen: Drugs of Abuse     Component Value Date/Time   LABOPIA NONE DETECTED 02/04/2014 0630   LABOPIA NEG 11/22/2011 1402  COCAINSCRNUR NONE DETECTED 02/04/2014 0630   COCAINSCRNUR NEG 11/22/2011 1402   LABBENZ NONE DETECTED 02/04/2014 0630   LABBENZ NEG 11/22/2011 1402   LABBENZ NEG 08/07/2011 1217   AMPHETMU NONE DETECTED 02/04/2014 0630   AMPHETMU NEG 11/22/2011 1402   AMPHETMU NEG 08/07/2011 1217   THCU NONE DETECTED 02/04/2014 0630   THCU NEG 11/22/2011 1402   LABBARB NONE DETECTED 02/04/2014 0630   LABBARB NEG 11/22/2011 1402    Alcohol Level: No results for input(s): ETH in the last 168 hours. Urinalysis:  Recent Labs Lab 04/19/14 1930  COLORURINE YELLOW  LABSPEC 1.036*  PHURINE 6.0  GLUCOSEU >1000*  HGBUR NEGATIVE  BILIRUBINUR NEGATIVE  KETONESUR  NEGATIVE  PROTEINUR NEGATIVE  UROBILINOGEN 1.0  NITRITE NEGATIVE  LEUKOCYTESUR NEGATIVE   Misc. Labs: HIV Viral Load: 57  Micro Results: Recent Results (from the past 240 hour(s))  MRSA PCR Screening     Status: Abnormal   Collection Time: 04/19/14 11:07 PM  Result Value Ref Range Status   MRSA by PCR POSITIVE (A) NEGATIVE Final    Comment:        The GeneXpert MRSA Assay (FDA approved for NASAL specimens only), is one component of a comprehensive MRSA colonization surveillance program. It is not intended to diagnose MRSA infection nor to guide or monitor treatment for MRSA infections. RESULT CALLED TO, READ BACK BY AND VERIFIED WITH: M.YIM,RN 0117 //13/16 M.CAMPBELL   Culture, blood (routine x 2)     Status: None (Preliminary result)   Collection Time: 04/19/14 11:50 PM  Result Value Ref Range Status   Specimen Description BLOOD RIGHT HAND  Final   Special Requests BOTTLES DRAWN AEROBIC ONLY 5CC  Final   Culture   Final           BLOOD CULTURE RECEIVED NO GROWTH TO DATE CULTURE WILL BE HELD FOR 5 DAYS BEFORE ISSUING A FINAL NEGATIVE REPORT Note: Culture results may be compromised due to an excessive volume of blood received in culture bottles. Performed at [facility]    Report Status PENDING  Incomplete  Culture, blood (routine x 2)     Status: None (Preliminary result)   Collection Time: 04/20/14 12:00 AM  Result Value Ref Range Status   Specimen Description BLOOD LEFT ARM  Final   Special Requests BOTTLES DRAWN AEROBIC ONLY 5CC  Final   Culture   Final           BLOOD CULTURE RECEIVED NO GROWTH TO DATE CULTURE WILL BE HELD FOR 5 DAYS BEFORE ISSUING A FINAL NEGATIVE REPORT Performed at [facility]    Report Status PENDING  Incomplete   Studies/Results: No results found. Medications:  Scheduled Meds: . aspirin  81 mg Oral Daily  . Chlorhexidine Gluconate Cloth  6 each Topical Q0600  . dapsone  100 mg Oral Daily  . Darunavir Ethanolate  800  mg Oral Q breakfast  . emtricitabine-tenofovir  1 tablet Oral Daily  . enoxaparin (LOVENOX) injection  40 mg Subcutaneous Q24H  . insulin aspart  0-5 Units Subcutaneous QHS  . insulin aspart  0-9 Units Subcutaneous TID WC  . insulin aspart  6 Units Subcutaneous TID WC  . insulin glargine  50 Units Subcutaneous QHS  . mupirocin ointment  1 application Nasal BID  . pravastatin  40 mg Oral q1800  . raltegravir  400 mg Oral BID  . ritonavir  100 mg Oral Q breakfast  . testosterone  5 g Transdermal Daily  . vancomycin  1,000 mg Intravenous Q8H   Continuous Infusions:  PRN Meds:.oxyCODONE  Assessment/Plan: Principal Problem:   Furuncle of neck Active Problems:   AIDS   Essential hypertension   Hyperlipidemia   Hyperglycemia due to type 2 diabetes mellitus   MRSA carrier   Testosterone deficiency   Cellulitis, neck   Keith [name] is a 49 y.o. male with PMH of HIV, DM2, HTN, HL presents with R sided neck mass and R thumb abscess for the past three weeks.    **Raised, Indurated Lesion at R Jaw: The differential includes furuncle, abscess, and malignancy. His CT is notable for edema at R sternocleidomastoid muscle with no definite fluid collection or abscess seen and likely reactive lymph nodes in the R level 2 and submandibular regions. He was MRSA positive on 1/12 and was started on vancomycin in the ED. Bacterial infection is most likely Streptococci or Staph aureus. He is at increased risk of infection given his poorly controlled DM2 and HIV.  -Per ENT consult note: "progressive soft tissue infection in the right lower neck with coalescence and possible abscess formation the subcutaneous tissue" Patient will have surgical procedure under anesthesia to drain this abscess -Continue Vancomycin 1 g IV every 8 hours and warm compresses every hour -Continue Oxycodone 10 mg every 6 hours as needed and Morphine 4 mg every 4 hours as needed for severe pain -Blood Cultures x 2 (obtained  after vancomycin administration): NGTD  **Hyperglycemia in setting of poorly controlled DM2: Last A1c 02/04/14 was 14.9. Medication non-compliance is likely as he has responded well to the novolog 10 u in the ED with his sugars down from 462 to 230 on 1/12 -On 1/12 Hgb A1C >20 -Continue Lantus to 50 units nigthtly -SSI 0-9 units with meals and 0-5 units nightly -CBG q4h. On 1/15 Blood glucose in the 230s  **HIV: He is followed by Dr [name] of ID and has history of medication non-compliance -Continue norvir 100 mg daily, prezista 800 mg daily, truvada 200-300 mg daily, dapsone 100 mg daily -ID Consult: On 1/15 discontinued isentress 400 mg bid and started on tivicay 50 mg daily -On 1/12 HIV RNA: 284 -CD4 count: Pending  **HTN (resolved): On admission blood pressure elevated at 149/93. On 1/14 blood pressure 114/83 -Continue ASA 81 mg daily -Would benefit from ACE inhibitor given DM2  **HLD: Last lipid panel 06/02/13 cholesterol 268, triglyceride 196, HDL 43, LDL 186 -Continue pravastatin 40 mg daily  **History of Low Testosterone: On 1/13 Testosterone at 237 -Restarted on home Androgel 5 g patch daily  Diet: NPO  DVT PPx: Lovenox 20 mg Tolu daily  Code: Full  Dispo: Disposition is deferred at this time, awaiting improvement of current medical problems. Anticipated discharge in approximately 2 day(s).   The patient does have a current PCP Corky Sox, MD) and does need an [facility] hospital follow-up appointment after discharge  This is a Careers information officer Note. The care of the patient was discussed with Dr. Ronnald Ramp and the assessment and plan was formulated with their assistance. Please see their note for official documentation of the patient encounter.   .Services Needed at time of discharge: Y = Yes, Blank = No PT:   OT:   RN:   Equipment:   Other:       LOS: 3 days   Signed: Laneta Simmers, Med Student Internal Medicine Teaching Service Team B2   [phone] 04/22/2014, 8:26 AM

## 2014-04-22 NOTE — Progress Notes (Signed)
[name], male   DOB: 21-Jan-1966, 49 y.o.   MRN: [id number] Medicine attending: I personally interviewed and examined this patient and reviewed pertinent medical, laboratory, and radiographic data and I concur with the evaluation and management plan as recorded by medical resident Dr. Luanne Bras and medical student Ms. Honor Loh. We greatly appreciate prompt attention by ear nose and throat surgery. The patient was taken to surgery for proper incision and drainage of the large right neck abscess. We will continue current antibiotics pending results of additional cultures and samples taken on debridement today.

## 2014-04-22 NOTE — Brief Op Note (Signed)
04/19/2014 - 04/22/2014  5:09 PM  PATIENT:  Keith [name]  49 y.o. male  PRE-OPERATIVE DIAGNOSIS:  right neck abcess  POST-OPERATIVE DIAGNOSIS:  right neck abcess  PROCEDURE:  Procedure(s): IRRIGATION AND DEBRIDEMENT OF RIGHT NECK ABCESS (Right)  SURGEON:  Surgeon(s) and Role:    * Jerrell Belfast, MD - Primary  PHYSICIAN ASSISTANT:   ASSISTANTS: none   ANESTHESIA:   general  EBL:  Total I/O In: -  Out: 450 [Urine:450]  BLOOD ADMINISTERED:none  DRAINS: Penrose drain in the Rt neck   LOCAL MEDICATIONS USED:  LIDOCAINE  and Amount: 1 ml  SPECIMEN:  Source of Specimen:  Rt neck infection  DISPOSITION OF SPECIMEN:  Microbiology  COUNTS:  YES  TOURNIQUET:  * No tourniquets in log *  DICTATION: .Other Dictation: Dictation Number [id number]  PLAN OF CARE: Admit to inpatient   PATIENT DISPOSITION:  PACU - hemodynamically stable.   Delay start of Pharmacological VTE agent (>24hrs) due to surgical blood loss or risk of bleeding: no

## 2014-04-22 NOTE — Consult Note (Signed)
ENT Consult:  Reason for Consult:Right neck infection  Referring Physician: Med Teaching Service  Keith [name] is an 49 y.o. male.   HPI: The patient was admitted to the medical teaching service for evaluation of right neck pain and cellulitis and soft tissue infection. CT scan from 1/12 shows cellulitic changes in the right lateral neck skin and small adenopathy without evidence of abscess. Over the course of his hospitalization the patient has failed to have any significant improvement in pain and swelling, he has noted increased tenderness and fullness in the right posterior lateral neck. Patient has a history of poorly managed HIV and diabetes.  Past Medical History  Diagnosis Date  . HIV (human immunodeficiency virus infection) 2009    CD4 count 100, VL 13800 (05/01/2010)  . Diabetes type 2, uncontrolled     HgA1c 17.6 (04/27/2010)  . Hypertension   . Genital warts   . Erectile dysfunction   . Chronic knee pain     right  . Osteomyelitis     h/o hand  . Diabetes mellitus   . Chronic pain   . AIDS     Past Surgical History  Procedure Laterality Date  . Multiple extractions with alveoloplasty N/A 01/18/2013    Procedure: MULTIPLE EXTRACION 3, 6, 7, 10, 11, 13, 21, 22, 27, 28, 29, 30 WITH ALVEOLOPLASTY;  Surgeon: Gae Bon, DDS;  Location: [facility];  Service: Oral Surgery;  Laterality: N/A;  . Hernia repair      Family History  Problem Relation Age of Onset  . Hypertension Mother   . Arthritis Father   . Hypertension Father   . Hypertension Brother   . Cancer Maternal Grandmother 46    unknown type of cancer  . Depression Paternal Grandmother     Social History:  reports that he has never smoked. He has never used smokeless tobacco. He reports that he does not drink alcohol or use illicit drugs.  Allergies:  Allergies  Allergen Reactions  . Sulfa Antibiotics Itching    Medications: I have reviewed the patient's current medications.  Results for orders  placed or performed during the hospital encounter of 04/19/14 (from the past 48 hour(s))  Glucose, capillary     Status: Abnormal   Collection Time: 04/20/14 12:05 PM  Result Value Ref Range   Glucose-Capillary 218 (H) 70 - 99 mg/dL  Glucose, capillary     Status: Abnormal   Collection Time: 04/20/14  5:23 PM  Result Value Ref Range   Glucose-Capillary 264 (H) 70 - 99 mg/dL   Comment 1 Documented in Chart    Comment 2 Notify RN   Glucose, capillary     Status: Abnormal   Collection Time: 04/20/14 10:54 PM  Result Value Ref Range   Glucose-Capillary 273 (H) 70 - 99 mg/dL  Glucose, capillary     Status: Abnormal   Collection Time: 04/21/14  6:46 AM  Result Value Ref Range   Glucose-Capillary 228 (H) 70 - 99 mg/dL  Glucose, capillary     Status: Abnormal   Collection Time: 04/21/14 11:19 AM  Result Value Ref Range   Glucose-Capillary 191 (H) 70 - 99 mg/dL   Comment 1 Notify RN   Glucose, capillary     Status: Abnormal   Collection Time: 04/21/14  4:26 PM  Result Value Ref Range   Glucose-Capillary 176 (H) 70 - 99 mg/dL   Comment 1 Notify RN   Glucose, capillary     Status: Abnormal   Collection  Time: 04/21/14 10:03 PM  Result Value Ref Range   Glucose-Capillary 263 (H) 70 - 99 mg/dL  CBC with Differential     Status: Abnormal   Collection Time: 04/22/14  5:08 AM  Result Value Ref Range   WBC 6.3 4.0 - 10.5 K/uL   RBC 4.70 4.22 - 5.81 MIL/uL   Hemoglobin 12.4 (L) 13.0 - 17.0 g/dL   HCT 65.9 (L) 66.0 - 72.1 %   MCV 79.1 78.0 - 100.0 fL   MCH 26.4 26.0 - 34.0 pg   MCHC 33.3 30.0 - 36.0 g/dL   RDW 03.7 48.3 - 64.1 %   Platelets 235 150 - 400 K/uL   Neutrophils Relative % 50 43 - 77 %   Neutro Abs 3.1 1.7 - 7.7 K/uL   Lymphocytes Relative 41 12 - 46 %   Lymphs Abs 2.5 0.7 - 4.0 K/uL   Monocytes Relative 8 3 - 12 %   Monocytes Absolute 0.5 0.1 - 1.0 K/uL   Eosinophils Relative 1 0 - 5 %   Eosinophils Absolute 0.1 0.0 - 0.7 K/uL   Basophils Relative 0 0 - 1 %   Basophils  Absolute 0.0 0.0 - 0.1 K/uL  Basic metabolic panel     Status: Abnormal   Collection Time: 04/22/14  5:08 AM  Result Value Ref Range   Sodium 136 135 - 145 mmol/L    Comment: Please note change in reference range.   Potassium 3.7 3.5 - 5.1 mmol/L    Comment: Please note change in reference range.   Chloride 102 96 - 112 mEq/L   CO2 29 19 - 32 mmol/L   Glucose, Bld 184 (H) 70 - 99 mg/dL   BUN 6 6 - 23 mg/dL   Creatinine, Ser 5.55 0.50 - 1.35 mg/dL   Calcium 9.0 8.4 - 77.7 mg/dL   GFR calc non Af Amer >90 >90 mL/min   GFR calc Af Amer >90 >90 mL/min    Comment: (NOTE) The eGFR has been calculated using the CKD EPI equation. This calculation has not been validated in all clinical situations. eGFR's persistently <90 mL/min signify possible Chronic Kidney Disease.    Anion gap 5 5 - 15  Glucose, capillary     Status: Abnormal   Collection Time: 04/22/14  6:46 AM  Result Value Ref Range   Glucose-Capillary 161 (H) 70 - 99 mg/dL    CT scan of the neck from 1/12 is reviewed in detail.   Review of Systems  Constitutional: Negative.   Respiratory: Negative.   Cardiovascular: Negative.   Neurological: Positive for headaches.   Blood pressure 114/88, pulse 69, temperature 97.7 F (36.5 C), temperature source Oral, resp. rate 17, height 5' 10.08" (1.78 m), weight 113.399 kg (250 lb), SpO2 99 %. Physical Exam  Constitutional: He is oriented to person, place, and time. He appears well-developed and well-nourished.  HENT:  Right Ear: External ear normal.  Left Ear: External ear normal.  Nose: Nose normal.  Mouth/Throat: Oropharynx is clear and moist.  Neck:  Firm tender Right neck swelling with induration  Cardiovascular: Normal rate and regular rhythm.   Respiratory: Effort normal and breath sounds normal.  Neurological: He is alert and oriented to person, place, and time.    Assessment/Plan: Clinical findings are consistent with progressive soft tissue infection in the right  lower neck with coalescence and possible abscess formation the subcutaneous tissue. Agree with medical physicians, patient would benefit from incision and drainage of the abscess, this  will accelerate healing and resolution of his active infection. Given relatively large size and the patient's acute tenderness I would recommend the surgical procedure performed under anesthesia in the operating room later this afternoon. The risks and benefits of procedure were discussed in detail the patient understands and agrees, informed consent was obtained. Postoperatively plan continued treatment for infection with intravenous antibiotics and pain medication, expect improvement in active infection and coalescence over the next 3-5 days.  Keith [name], Keith [name] 04/22/2014, 11:11 AM

## 2014-04-23 DIAGNOSIS — B9562 Methicillin resistant Staphylococcus aureus infection as the cause of diseases classified elsewhere: Secondary | ICD-10-CM

## 2014-04-23 LAB — CBC WITH DIFFERENTIAL/PLATELET
Basophils Absolute: 0 10*3/uL (ref 0.0–0.1)
Basophils Relative: 0 % (ref 0–1)
Eosinophils Absolute: 0.1 10*3/uL (ref 0.0–0.7)
Eosinophils Relative: 2 % (ref 0–5)
HCT: 38.1 % — ABNORMAL LOW (ref 39.0–52.0)
Hemoglobin: 12.5 g/dL — ABNORMAL LOW (ref 13.0–17.0)
Lymphocytes Relative: 36 % (ref 12–46)
Lymphs Abs: 2.5 10*3/uL (ref 0.7–4.0)
MCH: 26.7 pg (ref 26.0–34.0)
MCHC: 32.8 g/dL (ref 30.0–36.0)
MCV: 81.4 fL (ref 78.0–100.0)
Monocytes Absolute: 0.5 10*3/uL (ref 0.1–1.0)
Monocytes Relative: 7 % (ref 3–12)
Neutro Abs: 3.7 10*3/uL (ref 1.7–7.7)
Neutrophils Relative %: 55 % (ref 43–77)
Platelets: 236 10*3/uL (ref 150–400)
RBC: 4.68 MIL/uL (ref 4.22–5.81)
RDW: 12.8 % (ref 11.5–15.5)
WBC: 6.8 10*3/uL (ref 4.0–10.5)

## 2014-04-23 LAB — GLUCOSE, CAPILLARY
Glucose-Capillary: 292 mg/dL — ABNORMAL HIGH (ref 70–99)
Glucose-Capillary: 224 mg/dL — ABNORMAL HIGH (ref 70–99)
Glucose-Capillary: 179 mg/dL — ABNORMAL HIGH (ref 70–99)
Glucose-Capillary: 132 mg/dL — ABNORMAL HIGH (ref 70–99)
Glucose-Capillary: 102 mg/dL — ABNORMAL HIGH (ref 70–99)

## 2014-04-23 LAB — BASIC METABOLIC PANEL
Anion gap: 8 (ref 5–15)
BUN: 8 mg/dL (ref 6–23)
CO2: 28 mmol/L (ref 19–32)
Calcium: 9 mg/dL (ref 8.4–10.5)
Chloride: 100 mEq/L (ref 96–112)
Creatinine, Ser: 0.74 mg/dL (ref 0.50–1.35)
GFR calc Af Amer: >90 mL/min (ref >90)
GFR calc non Af Amer: >90 mL/min (ref >90)
Glucose, Bld: 296 mg/dL — ABNORMAL HIGH (ref 70–99)
Potassium: 4 mmol/L (ref 3.5–5.1)
Sodium: 136 mmol/L (ref 135–145)

## 2014-04-23 LAB — VANCOMYCIN, TROUGH: Vancomycin Tr: 11.3 ug/mL (ref 10.0–20.0)

## 2014-04-23 MED ORDER — INSULIN ASPART 100 UNIT/ML ~~LOC~~ SOLN
8.0000 [IU] | Freq: Three times a day (TID) | SUBCUTANEOUS | Status: DC
  Administered 2014-04-23 – 2014-04-24 (×5): 8 [IU] via SUBCUTANEOUS

## 2014-04-23 MED ORDER — INSULIN GLARGINE 100 UNIT/ML ~~LOC~~ SOLN
52.0000 [IU] | Freq: Every day | SUBCUTANEOUS | Status: DC
  Filled 2014-04-23 (×2): qty 0.52

## 2014-04-23 NOTE — Progress Notes (Signed)
Subjective: Feels much better this AM, says his pain is improved from yesterday, now s/p I&D. NO fever or chills overnight. Cultures pending. Dressing in place, drain also present, appears to be draining purulent material.   Objective: Vital signs in last 24 hours: Filed Vitals:   04/22/14 1828 04/22/14 2013 04/23/14 0153 04/23/14 0510  BP: 139/91 139/98 102/70 125/85  Pulse: 75 80 73 81  Temp: 97.9 F (36.6 C) 98.5 F (36.9 C) 98.5 F (36.9 C) 98.2 F (36.8 C)  TempSrc:  Oral Oral Oral  Resp: 16 16 18 18   Height:      Weight:      SpO2: 97% 100% 100% 98%   Weight change:   Intake/Output Summary (Last 24 hours) at 04/23/14 0916 Last data filed at 04/23/14 0341  Gross per 24 hour  Intake   1340 ml  Output    850 ml  Net    490 ml   Physical Exam: General: Obese AA male, alert, cooperative, NAD. HEENT: PERRL, EOMI. Moist mucus membranes. Right neck abscess w/ dressing and drain, draining scant purulent material. Still tender to the touch, decreased swelling.  Neck: Full range of motion without pain, supple, no carotid bruits. Right neck swelling as above.  Lungs: Clear to ascultation bilaterally, normal work of respiration, no wheezes, rales, rhonchi Heart: RRR, no murmurs, gallops, or rubs Abdomen: Soft, non-tender, non-distended, BS + Extremities: No cyanosis, clubbing, or edema Neurologic: Alert & oriented X3, cranial nerves II-XII intact, strength grossly intact, sensation intact to light touch    Lab Results: Basic Metabolic Panel:  Recent Labs Lab 04/22/14 0508 04/23/14 0425  NA 136 136  K 3.7 4.0  CL 102 100  CO2 29 28  GLUCOSE 184* 296*  BUN 6 8  CREATININE 0.68 0.74  CALCIUM 9.0 9.0  CBC:  Recent Labs Lab 04/22/14 0508 04/23/14 0425  WBC 6.3 6.8  NEUTROABS 3.1 3.7  HGB 12.4* 12.5*  HCT 37.2* 38.1*  MCV 79.1 81.4  PLT 235 236   CBG:  Recent Labs Lab 04/21/14 2203 04/22/14 0646 04/22/14 1155 04/22/14 1720 04/22/14 2203  04/23/14 0420  GLUCAP 263* 161* 231* 145* 252* 292*     Urinalysis:  Recent Labs Lab 04/19/14 1930  COLORURINE YELLOW  LABSPEC 1.036*  PHURINE 6.0  GLUCOSEU >1000*  HGBUR NEGATIVE  BILIRUBINUR NEGATIVE  KETONESUR NEGATIVE  PROTEINUR NEGATIVE  UROBILINOGEN 1.0  NITRITE NEGATIVE  LEUKOCYTESUR NEGATIVE   Micro Results: Recent Results (from the past 240 hour(s))  MRSA PCR Screening     Status: Abnormal   Collection Time: 04/19/14 11:07 PM  Result Value Ref Range Status   MRSA by PCR POSITIVE (A) NEGATIVE Final    Comment:        The GeneXpert MRSA Assay (FDA approved for NASAL specimens only), is one component of a comprehensive MRSA colonization surveillance program. It is not intended to diagnose MRSA infection nor to guide or monitor treatment for MRSA infections. RESULT CALLED TO, READ BACK BY AND VERIFIED WITH: M.YIM,RN 0117 //13/16 M.CAMPBELL   Culture, blood (routine x 2)     Status: None (Preliminary result)   Collection Time: 04/19/14 11:50 PM  Result Value Ref Range Status   Specimen Description BLOOD RIGHT HAND  Final   Special Requests BOTTLES DRAWN AEROBIC ONLY 5CC  Final   Culture   Final           BLOOD CULTURE RECEIVED NO GROWTH TO DATE CULTURE WILL BE HELD FOR 5 DAYS  BEFORE ISSUING A FINAL NEGATIVE REPORT Note: Culture results may be compromised due to an excessive volume of blood received in culture bottles. Performed at [facility]    Report Status PENDING  Incomplete  Culture, blood (routine x 2)     Status: None (Preliminary result)   Collection Time: 04/20/14 12:00 AM  Result Value Ref Range Status   Specimen Description BLOOD LEFT ARM  Final   Special Requests BOTTLES DRAWN AEROBIC ONLY 5CC  Final   Culture   Final           BLOOD CULTURE RECEIVED NO GROWTH TO DATE CULTURE WILL BE HELD FOR 5 DAYS BEFORE ISSUING A FINAL NEGATIVE REPORT Performed at [facility]    Report Status PENDING  Incomplete  Culture,  routine-abscess     Status: None (Preliminary result)   Collection Time: 04/22/14  4:56 PM  Result Value Ref Range Status   Specimen Description TISSUE RIGHT NECK  Final   Special Requests PATIENT ON FOLLOWING VANCO  Final   Gram Stain PENDING  Incomplete   Culture   Final    Culture reincubated for better growth Performed at [facility]    Report Status PENDING  Incomplete    Medications: I have reviewed the patient's current medications. Scheduled Meds: . aspirin  81 mg Oral Daily  . Chlorhexidine Gluconate Cloth  6 each Topical Q0600  . dapsone  100 mg Oral Daily  . Darunavir Ethanolate  800 mg Oral Q breakfast  . dolutegravir  50 mg Oral Daily  . emtricitabine-tenofovir  1 tablet Oral Daily  . enoxaparin (LOVENOX) injection  40 mg Subcutaneous Q24H  . insulin aspart  0-5 Units Subcutaneous QHS  . insulin aspart  0-9 Units Subcutaneous TID WC  . insulin aspart  8 Units Subcutaneous TID WC  . insulin glargine  52 Units Subcutaneous QHS  . mupirocin ointment  1 application Nasal BID  . pravastatin  40 mg Oral q1800  . ritonavir  100 mg Oral Q breakfast  . testosterone  5 g Transdermal Daily  . vancomycin  1,000 mg Intravenous Q8H   Continuous Infusions: . lactated ringers 50 mL/hr at 04/23/14 0115   PRN Meds:.oxyCODONE   Assessment/Plan: 49 y/o M w/ PMHx of HIV, uncontroled DM type II, HTN, HLD, admitted for hyperglycemia and right neck abscess.   Right Neck Abscess: Now s/p I&D per Dr. Wilburn Cornelia. Drain in place. Patient says his pain is significantly improved from yesterday.  -ENT consulted, surgical management appreciated -Continue IV Vancomycin for now; will change pending wound culture results.  -Continue Oxycodone 10mg  q6h prn for pain.   DM type 2 w/ Hyperglycemia: Very poorly controlled, HbA1c >20%. CBG's still in the 200's today. CBG trend as follows:   Recent Labs Lab 04/22/14 0646 04/22/14 1155 04/22/14 1720 04/22/14 2203 04/23/14 0420  GLUCAP  161* 231* 145* 252* 292*  -Increase Lantus to 52 units. -Increase Novolog to 8 units tid -Continue ISS-S + HS coverage -[facility] to follow patient on discharge. Will also set up [facility] for patient  HIV: Was takin Isentress, Prezista, Truvada, and Norvir at home. Also takes Dapsone. H/o non-compliance w/ [facility] follow up. Most recent CD4 120, VL 45 (02/08/14).  -ID following, appreciate recs; changed to Prezista, Norvir, Truvada, and TIVICAY.  -ID follow up on discharge.   HTN: BP well controlled currently.  -Consider starting lisinopril 5mg  daily in outpatient clinic.   Hyperlipidemia: Stable.  -Continue Pravastatin 40mg  daily  DVT/PE PPx:  Lovenox [place]  Dispo: Disposition is deferred at this time, awaiting improvement of current medical problems.  Anticipated discharge in approximately 1-2 day(s).   The patient does have a current PCP (No primary care provider on file.) and does not need an [facility] hospital follow-up appointment after discharge.  The patient does not have transportation limitations that hinder transportation to clinic appointments.  .Services Needed at time of discharge: Y = Yes, Blank = No PT:   OT:   RN:   Equipment:   Other:     LOS: 4 days   Corky Sox, MD  [facility], PGY2  04/23/2014, 9:16 AM

## 2014-04-23 NOTE — Progress Notes (Signed)
ANTIBIOTIC CONSULT NOTE - FOLLOW UP  Pharmacy Consult for vancomycin Indication: neck abscess  Allergies  Allergen Reactions  . Sulfa Antibiotics Itching    Patient Measurements: Height: 5' 10.08" (178 cm) Weight: 250 lb (113.399 kg) (estimated by patient) IBW/kg (Calculated) : 73.18  Vital Signs: Temp: 98.2 F (36.8 C) (01/16 0510) Temp Source: Oral (01/16 0510) BP: 125/85 mmHg (01/16 0510) Pulse Rate: 81 (01/16 0510) Intake/Output from previous day: 01/15 0701 - 01/16 0700 In: [id number] [P.O.:840; I.V.:500] Out: 850 [Urine:850] Intake/Output from this shift:    Labs:  Recent Labs  04/22/14 0508 04/23/14 0425  WBC 6.3 6.8  HGB 12.4* 12.5*  PLT 235 236  CREATININE 0.68 0.74   Estimated Creatinine Clearance: 141.1 mL/min (by C-G formula based on Cr of 0.74).  Recent Labs  04/23/14 0940  [place] 11.3     Microbiology: Recent Results (from the past 720 hour(s))  MRSA PCR Screening     Status: Abnormal   Collection Time: 04/19/14 11:07 PM  Result Value Ref Range Status   MRSA by PCR POSITIVE (A) NEGATIVE Final    Comment:        The GeneXpert MRSA Assay (FDA approved for NASAL specimens only), is one component of a comprehensive MRSA colonization surveillance program. It is not intended to diagnose MRSA infection nor to guide or monitor treatment for MRSA infections. RESULT CALLED TO, READ BACK BY AND VERIFIED WITH: M.YIM,RN 0117 //13/16 M.CAMPBELL   Culture, blood (routine x 2)     Status: None (Preliminary result)   Collection Time: 04/19/14 11:50 PM  Result Value Ref Range Status   Specimen Description BLOOD RIGHT HAND  Final   Special Requests BOTTLES DRAWN AEROBIC ONLY 5CC  Final   Culture   Final           BLOOD CULTURE RECEIVED NO GROWTH TO DATE CULTURE WILL BE HELD FOR 5 DAYS BEFORE ISSUING A FINAL NEGATIVE REPORT Note: Culture results may be compromised due to an excessive volume of blood received in culture bottles. Performed at [facility]    Report Status PENDING  Incomplete  Culture, blood (routine x 2)     Status: None (Preliminary result)   Collection Time: 04/20/14 12:00 AM  Result Value Ref Range Status   Specimen Description BLOOD LEFT ARM  Final   Special Requests BOTTLES DRAWN AEROBIC ONLY 5CC  Final   Culture   Final           BLOOD CULTURE RECEIVED NO GROWTH TO DATE CULTURE WILL BE HELD FOR 5 DAYS BEFORE ISSUING A FINAL NEGATIVE REPORT Performed at [facility]    Report Status PENDING  Incomplete  Anaerobic culture     Status: None (Preliminary result)   Collection Time: 04/22/14  4:56 PM  Result Value Ref Range Status   Specimen Description TISSUE RIGHT NECK  Final   Special Requests PATIENT ON FOLLOWING VANCO  Final   Gram Stain PENDING  Incomplete   Culture   Final    NO ANAEROBES ISOLATED; CULTURE IN PROGRESS FOR 5 DAYS Performed at [facility]    Report Status PENDING  Incomplete  Culture, routine-abscess     Status: None (Preliminary result)   Collection Time: 04/22/14  4:56 PM  Result Value Ref Range Status   Specimen Description TISSUE RIGHT NECK  Final   Special Requests PATIENT ON FOLLOWING VANCO  Final   Gram Stain PENDING  Incomplete   Culture   Final  Culture reincubated for better growth Performed at [facility]    Report Status PENDING  Incomplete    Anti-infectives    Start     Dose/Rate Route Frequency Ordered Stop   04/22/14 1100  dolutegravir (TIVICAY) tablet 50 mg     50 mg Oral Daily 04/22/14 0928     04/20/14 2100  vancomycin (VANCOCIN) IVPB 1000 mg/200 mL premix  Status:  Discontinued     1,000 mg200 mL/hr over 60 Minutes Intravenous Every 24 hours 04/20/14 1458 04/20/14 1701   04/20/14 1900  Darunavir Ethanolate (PREZISTA) tablet 800 mg     800 mg Oral Daily with breakfast 04/20/14 1840     04/20/14 1800  vancomycin (VANCOCIN) IVPB 1000 mg/200 mL premix     1,000 mg200 mL/hr over 60 Minutes Intravenous Every 8 hours 04/20/14 1701      04/20/14 1700  Darunavir Ethanolate (PREZISTA) tablet 800 mg  Status:  Discontinued     800 mg Oral 2 times daily with meals 04/20/14 1406 04/20/14 1408   04/20/14 1500  Darunavir Ethanolate (PREZISTA) tablet 800 mg  Status:  Discontinued     800 mg Oral Daily with breakfast 04/20/14 1408 04/20/14 1840   04/20/14 1000  dapsone tablet 100 mg     100 mg Oral Daily 04/19/14 2249     04/20/14 1000  emtricitabine-tenofovir (TRUVADA) 200-300 MG per tablet 1 tablet     1 tablet Oral Daily 04/19/14 2249     04/20/14 0800  ritonavir (NORVIR) tablet 100 mg     100 mg Oral Daily with breakfast 04/19/14 2249     04/20/14 0800  darunavir (PREZISTA) tablet 600 mg  Status:  Discontinued     600 mg Oral 2 times daily with meals 04/19/14 2249 04/20/14 1406   04/19/14 2300  raltegravir (ISENTRESS) tablet 400 mg  Status:  Discontinued     400 mg Oral 2 times daily 04/19/14 2249 04/22/14 0928   04/19/14 2030  vancomycin (VANCOCIN) IVPB 1000 mg/200 mL premix     1,000 mg200 mL/hr over 60 Minutes Intravenous  Once 04/19/14 2019 04/19/14 2154      Assessment: Patient is a 49 y.o M on heparin for right neck abscess- s/p I&D on 1/15 with drain in place.  He remains afebrile, wbc wnl, and all cultures have been negative thus far. Vancomycin trough obtained this morning came back as 11.3 but patient missed 1 dose last night.  Vanc 1/12>>   ++ VT on 1/16= 11.3  (missed 1 dose on 1/15)  1/13 Blood x 2>> ngtd 1/15 anaerobic right neck tissue>> 1/15 R neck tissue>>   Goal of Therapy:  Vancomycin trough level 10-15 mcg/ml  Plan:  - cont vanc 1gm IV q8h for now.  Will plan on rechecking a true steady state level tomorrow if still on abx - follow renal fxn, culture data, progress   Saina Waage P 04/23/2014,11:23 AM

## 2014-04-23 NOTE — Op Note (Signed)
NAMERAMONTE, FECTEAU NO.:  [id number]  MEDICAL RECORD NO.:  [id number]  LOCATION:  5N31C                        FACILITY:  [facility]  PHYSICIAN:  Early Chars. Wilburn Cornelia, M.D.DATE OF BIRTH:  06/06/1965  DATE OF PROCEDURE:  04/22/2014 DATE OF DISCHARGE:                              OPERATIVE REPORT   PREOPERATIVE DIAGNOSIS:  Right neck abscess.  POSTOPERATIVE DIAGNOSIS:  Right neck abscess.  INDICATION FOR SURGERY:  Right neck abscess.  SURGICAL PROCEDURE:  Incision and drainage of right neck abscess.  ANESTHESIA:  General endotracheal.  SURGEON:  Early Chars. Wilburn Cornelia, MD  COMPLICATIONS:  None.  ESTIMATED BLOOD LOSS:  Minimal.  The patient transferred from the operating room to the recovery room in stable condition.  BRIEF HISTORY:  The patient is a 49 year old male who was admitted to the Medical Teaching Service on April 19, 2014 for evaluation of right neck swelling, cellulitis and infection.  The patient has a complicated medical history which includes poorly controlled HIV and diabetes.  He noted a gradually enlarging, tender swelling in the right lateral neck and was seen through the emergency department.  The patient was admitted to the hospital and started on broad-spectrum intravenous antibiotics. After approximately 3 days of treatment, the patient had progressive symptoms of tenderness and swelling with coalescence of the area of infection. ENT Service was consulted for evaluation.  Based on the patient's history and physical findings, I recommended that we undertake incision and drainage of the abscess under general anesthesia.  The risks and benefits of procedure were discussed in detail with the patient who understood and concurred with our plan for surgery which was scheduled on an urgent basis at [facility] on April 22, 2014.  DESCRIPTION OF PROCEDURE:  The patient was brought to the operating room, placed in supine  position on the operating table.  General endotracheal anesthesia was established without difficulty.  When the patient was adequately anesthetized, he was then positioned and prepped and draped.  His neck was injected with a total of 1 mL of 1% lidocaine and 1:100,000 solution epinephrine was injected in subcutaneous fashion in the anticipated surgical incision site.  After allowing adequate time for vasoconstriction and hemostasis, the procedure was begun by creating a 2 cm horizontally oriented incision through the skin.  This was carried through the skin and underlying subcutaneous tissue and a moderate amount of purulent material was expressed.  Using a curved hemostat, the wound was probed and further mucopurulent material was cleared.  The area was then thoroughly irrigated and suctioned.  There was no further apparent pus, but the soft tissues were significantly indurated and swollen.  A quarter-inch Penrose was then placed at the depth of the dissection and sutured to the skin with a 3-0 Ethilon suture. The patient's wound was then dressed with 4 x 4 gauze, and Kerlix wrap. He was then awakened from his anesthetic and was transferred from the operating room to the recovery room in stable condition.  There were no complications.  The blood loss was minimal.          ______________________________ Early Chars. Wilburn Cornelia, M.D.     DLS/MEDQ  D:  [id number]  T:  04/23/2014  Job:  [id number]

## 2014-04-23 NOTE — Progress Notes (Signed)
   ENT Progress Note: POD #1  s/p Procedure(s): IRRIGATION AND DEBRIDEMENT OF RIGHT NECK ABCESS   Subjective: Decreased pain  Objective: Vital signs in last 24 hours: Temp:  [97.5 F (36.4 C)-98.5 F (36.9 C)] 98.2 F (36.8 C) (01/16 0510) Pulse Rate:  [68-81] 81 (01/16 0510) Resp:  [9-18] 18 (01/16 0510) BP: (102-139)/(69-98) 125/85 mmHg (01/16 0510) SpO2:  [97 %-100 %] 98 % (01/16 0510) Weight change:  Last BM Date: 04/20/14  Intake/Output from previous day: 01/15 0701 - 01/16 0700 In: 1340 [P.O.:840; I.V.:500] Out: 850 [Urine:850] Intake/Output this shift:    Labs:  Recent Labs  04/22/14 0508 04/23/14 0425  WBC 6.3 6.8  HGB 12.4* 12.5*  HCT 37.2* 38.1*  PLT 235 236    Recent Labs  04/22/14 0508 04/23/14 0425  NA 136 136  K 3.7 4.0  CL 102 100  CO2 29 28  GLUCOSE 184* 296*  BUN 6 8  CALCIUM 9.0 9.0    Studies/Results: No results found.   PHYSICAL EXAM: Inc and drain intact Mild improvement in erythema   Assessment/Plan: Improving after I&D Cont IV Abx and medical care, will follow    Jeb Schloemer 04/23/2014, 10:29 AM

## 2014-04-23 NOTE — Progress Notes (Signed)
[place] [facility] Infectious [facility]    Subjective: Neck better after surgery   Antibiotics:  Anti-infectives    Start     Dose/Rate Route Frequency Ordered Stop   04/22/14 1100  dolutegravir (TIVICAY) tablet 50 mg     50 mg Oral Daily 04/22/14 0928     04/20/14 2100  vancomycin (VANCOCIN) IVPB 1000 mg/200 mL premix  Status:  Discontinued     1,000 mg200 mL/hr over 60 Minutes Intravenous Every 24 hours 04/20/14 1458 04/20/14 1701   04/20/14 1900  Darunavir Ethanolate (PREZISTA) tablet 800 mg     800 mg Oral Daily with breakfast 04/20/14 1840     04/20/14 1800  vancomycin (VANCOCIN) IVPB 1000 mg/200 mL premix     1,000 mg200 mL/hr over 60 Minutes Intravenous Every 8 hours 04/20/14 1701     04/20/14 1700  Darunavir Ethanolate (PREZISTA) tablet 800 mg  Status:  Discontinued     800 mg Oral 2 times daily with meals 04/20/14 1406 04/20/14 1408   04/20/14 1500  Darunavir Ethanolate (PREZISTA) tablet 800 mg  Status:  Discontinued     800 mg Oral Daily with breakfast 04/20/14 1408 04/20/14 1840   04/20/14 1000  dapsone tablet 100 mg     100 mg Oral Daily 04/19/14 2249     04/20/14 1000  emtricitabine-tenofovir (TRUVADA) 200-300 MG per tablet 1 tablet     1 tablet Oral Daily 04/19/14 2249     04/20/14 0800  ritonavir (NORVIR) tablet 100 mg     100 mg Oral Daily with breakfast 04/19/14 2249     04/20/14 0800  darunavir (PREZISTA) tablet 600 mg  Status:  Discontinued     600 mg Oral 2 times daily with meals 04/19/14 2249 04/20/14 1406   04/19/14 2300  raltegravir (ISENTRESS) tablet 400 mg  Status:  Discontinued     400 mg Oral 2 times daily 04/19/14 2249 04/22/14 0928   04/19/14 2030  vancomycin (VANCOCIN) IVPB 1000 mg/200 mL premix     1,000 mg200 mL/hr over 60 Minutes Intravenous  Once 04/19/14 2019 04/19/14 2154      Medications: Scheduled Meds: . aspirin  81 mg Oral Daily  . Chlorhexidine Gluconate Cloth  6 each Topical Q0600  . dapsone  100 mg Oral Daily  . Darunavir  Ethanolate  800 mg Oral Q breakfast  . dolutegravir  50 mg Oral Daily  . emtricitabine-tenofovir  1 tablet Oral Daily  . enoxaparin (LOVENOX) injection  40 mg Subcutaneous Q24H  . insulin aspart  0-5 Units Subcutaneous QHS  . insulin aspart  0-9 Units Subcutaneous TID WC  . insulin aspart  8 Units Subcutaneous TID WC  . insulin glargine  52 Units Subcutaneous QHS  . mupirocin ointment  1 application Nasal BID  . pravastatin  40 mg Oral q1800  . ritonavir  100 mg Oral Q breakfast  . testosterone  5 g Transdermal Daily  . vancomycin  1,000 mg Intravenous Q8H   Continuous Infusions: . lactated ringers 50 mL/hr at 04/23/14 0115   PRN Meds:.oxyCODONE    Objective: Weight change:   Intake/Output Summary (Last 24 hours) at 04/23/14 1304 Last data filed at 04/23/14 0341  Gross per 24 hour  Intake   1340 ml  Output    400 ml  Net    940 ml   Blood pressure 125/85, pulse 81, temperature 98.2 F (36.8 C), temperature source Oral, resp. rate 18, height 5' 10.08" (1.78 m), weight 250 lb (  113.399 kg), SpO2 98 %. Temp:  [97.5 F (36.4 C)-98.5 F (36.9 C)] 98.2 F (36.8 C) (01/16 0510) Pulse Rate:  [72-81] 81 (01/16 0510) Resp:  [9-18] 18 (01/16 0510) BP: (102-139)/(69-98) 125/85 mmHg (01/16 0510) SpO2:  [97 %-100 %] 98 % (01/16 0510)  Physical Exam: General: Alert and awake, oriented x3, not in any acute distress. HEENT: anicteric sclera, , EOMI,  Neck:  Has wick in place of I and D site  pic 04/21/14:    pic 04/22/14:      CVS regular rate, normal r, no murmur rubs or gallops Chest: clear to auscultation bilaterally, no wheezing, rales or rhonchi Abdomen: soft nontender, nondistended, normal bowel sounds, Extremities: no clubbing or edema noted bilaterally Neuro: nonfocal, strength and sensation intact    CBC:  Recent Labs Lab 04/19/14 1845 04/19/14 1852 04/20/14 0626 04/22/14 0508 04/23/14 0425  HGB 12.6* 14.6 12.4* 12.4* 12.5*  HCT 37.1* 43.0 36.5*  37.2* 38.1*  PLT 228  --  213 235 236     BMET  Recent Labs  04/22/14 0508 04/23/14 0425  NA 136 136  K 3.7 4.0  CL 102 100  CO2 29 28  GLUCOSE 184* 296*  BUN 6 8  CREATININE 0.68 0.74  CALCIUM 9.0 9.0     Liver Panel  No results for input(s): PROT, ALBUMIN, AST, ALT, ALKPHOS, BILITOT, BILIDIR, IBILI in the last 72 hours.     Sedimentation Rate No results for input(s): ESRSEDRATE in the last 72 hours. C-Reactive Protein No results for input(s): CRP in the last 72 hours.  Micro Results: Recent Results (from the past 720 hour(s))  MRSA PCR Screening     Status: Abnormal   Collection Time: 04/19/14 11:07 PM  Result Value Ref Range Status   MRSA by PCR POSITIVE (A) NEGATIVE Final    Comment:        The GeneXpert MRSA Assay (FDA approved for NASAL specimens only), is one component of a comprehensive MRSA colonization surveillance program. It is not intended to diagnose MRSA infection nor to guide or monitor treatment for MRSA infections. RESULT CALLED TO, READ BACK BY AND VERIFIED WITH: M.YIM,RN 0117 //13/16 M.CAMPBELL   Culture, blood (routine x 2)     Status: None (Preliminary result)   Collection Time: 04/19/14 11:50 PM  Result Value Ref Range Status   Specimen Description BLOOD RIGHT HAND  Final   Special Requests BOTTLES DRAWN AEROBIC ONLY 5CC  Final   Culture   Final           BLOOD CULTURE RECEIVED NO GROWTH TO DATE CULTURE WILL BE HELD FOR 5 DAYS BEFORE ISSUING A FINAL NEGATIVE REPORT Note: Culture results may be compromised due to an excessive volume of blood received in culture bottles. Performed at [facility]    Report Status PENDING  Incomplete  Culture, blood (routine x 2)     Status: None (Preliminary result)   Collection Time: 04/20/14 12:00 AM  Result Value Ref Range Status   Specimen Description BLOOD LEFT ARM  Final   Special Requests BOTTLES DRAWN AEROBIC ONLY 5CC  Final   Culture   Final           BLOOD CULTURE RECEIVED NO  GROWTH TO DATE CULTURE WILL BE HELD FOR 5 DAYS BEFORE ISSUING A FINAL NEGATIVE REPORT Performed at [facility]    Report Status PENDING  Incomplete  Anaerobic culture     Status: None (Preliminary result)   Collection Time:  04/22/14  4:56 PM  Result Value Ref Range Status   Specimen Description TISSUE RIGHT NECK  Final   Special Requests PATIENT ON FOLLOWING VANCO  Final   Gram Stain   Final    FEW WBC PRESENT,BOTH PMN AND MONONUCLEAR FEW GRAM POSITIVE COCCI IN PAIRS IN CLUSTERS Performed at [facility]    Culture   Final    NO ANAEROBES ISOLATED; CULTURE IN PROGRESS FOR 5 DAYS Performed at [facility]    Report Status PENDING  Incomplete  Culture, routine-abscess     Status: None (Preliminary result)   Collection Time: 04/22/14  4:56 PM  Result Value Ref Range Status   Specimen Description TISSUE RIGHT NECK  Final   Special Requests PATIENT ON FOLLOWING VANCO  Final   Gram Stain   Final    FEW WBC PRESENT,BOTH PMN AND MONONUCLEAR FEW GRAM POSITIVE COCCI IN PAIRS IN CLUSTERS Performed at [facility]    Culture   Final    Culture reincubated for better growth Performed at [facility]    Report Status PENDING  Incomplete    Studies/Results: No results found.    Assessment/Plan:  Principal Problem:   Furuncle of neck Active Problems:   AIDS   Essential hypertension   Hyperlipidemia   Hyperglycemia due to type 2 diabetes mellitus   MRSA carrier   Testosterone deficiency   Cellulitis, neck   Neck abscess    Keith [name] is a 49 y.o. male with male with HIV, large neck abscess:  #1 Neck abscess: Greatly appreciate  I am glad ENT Dr Wilburn Cornelia taking pt to the OR  Continue IV vancomycin Fu cultures  Wound care  #2 HIV: he needs to renew ADAP this month for Spring time  Changed  him to  Prezista, Norvir, Truvada and Tivicay all once daily (d/w Dr. [name])  He will need the Tivicay and rest of regimen   E prescribed nto [facility] on [facility] for pickup prior to leaving the hospital    LOS: 4 days   Keith [name] 04/23/2014, 1:04 PM

## 2014-04-24 LAB — CBC WITH DIFFERENTIAL/PLATELET
Basophils Absolute: 0 10*3/uL (ref 0.0–0.1)
Basophils Relative: 0 % (ref 0–1)
Eosinophils Absolute: 0.1 10*3/uL (ref 0.0–0.7)
Eosinophils Relative: 2 % (ref 0–5)
HCT: 35.5 % — ABNORMAL LOW (ref 39.0–52.0)
Hemoglobin: 11.6 g/dL — ABNORMAL LOW (ref 13.0–17.0)
Lymphocytes Relative: 46 % (ref 12–46)
Lymphs Abs: 2.9 10*3/uL (ref 0.7–4.0)
MCH: 26.2 pg (ref 26.0–34.0)
MCHC: 32.7 g/dL (ref 30.0–36.0)
MCV: 80.3 fL (ref 78.0–100.0)
Monocytes Absolute: 0.5 10*3/uL (ref 0.1–1.0)
Monocytes Relative: 7 % (ref 3–12)
Neutro Abs: 2.8 10*3/uL (ref 1.7–7.7)
Neutrophils Relative %: 45 % (ref 43–77)
Platelets: 238 10*3/uL (ref 150–400)
RBC: 4.42 MIL/uL (ref 4.22–5.81)
RDW: 12.6 % (ref 11.5–15.5)
WBC: 6.3 10*3/uL (ref 4.0–10.5)

## 2014-04-24 LAB — GLUCOSE, CAPILLARY
Glucose-Capillary: 296 mg/dL — ABNORMAL HIGH (ref 70–99)
Glucose-Capillary: 278 mg/dL — ABNORMAL HIGH (ref 70–99)

## 2014-04-24 MED ORDER — DOXYCYCLINE HYCLATE 100 MG PO TABS: 100.0000 mg | ORAL_TABLET | Freq: Two times a day (BID) | ORAL | Status: DC

## 2014-04-24 MED ORDER — DOLUTEGRAVIR SODIUM 50 MG PO TABS: 50.0000 mg | ORAL_TABLET | Freq: Every day | ORAL | Status: DC

## 2014-04-24 MED ORDER — OXYCODONE HCL 10 MG PO TABS: 10.0000 mg | ORAL_TABLET | Freq: Three times a day (TID) | ORAL | Status: DC

## 2014-04-24 MED ORDER — DOXYCYCLINE HYCLATE 100 MG PO TABS
100.0000 mg | ORAL_TABLET | Freq: Two times a day (BID) | ORAL | Status: DC
  Administered 2014-04-24: 100 mg via ORAL
  Filled 2014-04-24 (×2): qty 1

## 2014-04-24 MED ORDER — INSULIN GLARGINE 100 UNIT/ML ~~LOC~~ SOLN
54.0000 [IU] | Freq: Every day | SUBCUTANEOUS | Status: DC
  Filled 2014-04-24: qty 0.54

## 2014-04-24 NOTE — Progress Notes (Signed)
   ENT Progress Note: POD #2 s/p Procedure(s): IRRIGATION AND DEBRIDEMENT OF RIGHT NECK ABCESS   Subjective: Improved pain  Objective: Vital signs in last 24 hours: Temp:  [98.1 F (36.7 C)-98.2 F (36.8 C)] 98.1 F (36.7 C) (01/17 0611) Pulse Rate:  [73-80] 80 (01/17 0611) Resp:  [18] 18 (01/17 0611) BP: (134-137)/(79-97) 134/97 mmHg (01/17 0611) SpO2:  [97 %-100 %] 97 % (01/17 0611) Weight change:  Last BM Date: 04/23/14  Intake/Output from previous day: 01/16 0701 - 01/17 0700 In: 1910 [P.O.:1200; I.V.:510; IV Piggyback:200] Out: -  Intake/Output this shift:    Labs:  Recent Labs  04/23/14 0425 04/24/14 0501  WBC 6.8 6.3  HGB 12.5* 11.6*  HCT 38.1* 35.5*  PLT 236 238    Recent Labs  04/22/14 0508 04/23/14 0425  NA 136 136  K 3.7 4.0  CL 102 100  CO2 29 28  GLUCOSE 184* 296*  BUN 6 8  CALCIUM 9.0 9.0    Studies/Results: No results found.   PHYSICAL EXAM: Decreased erythema and swelling Drain removed, wound stable   Assessment/Plan: Pt sig. Improved Recom. D/C per Med Svc PO Abx - Doxycycline or Cleocin for 10d Wound care - prn dressing changes for several days, may bathe 1/20. F/U as OP in ~ 2wks or prn.    [place], Keith [name] 04/24/2014, 10:03 AM

## 2014-04-24 NOTE — Progress Notes (Signed)
Subjective: Pain improved, doing well this AM. No fever or chills. Slept well overnight.   Objective: Vital signs in last 24 hours: Filed Vitals:   04/23/14 0153 04/23/14 0510 04/23/14 2022 04/24/14 0611  BP: 102/70 125/85 137/79 134/97  Pulse: 73 81 73 80  Temp: 98.5 F (36.9 C) 98.2 F (36.8 C) 98.2 F (36.8 C) 98.1 F (36.7 C)  TempSrc: Oral Oral Oral Oral  Resp: 18 18 18 18   Height:      Weight:      SpO2: 100% 98% 100% 97%   Weight change:   Intake/Output Summary (Last 24 hours) at 04/24/14 0948 Last data filed at 04/24/14 0004  Gross per 24 hour  Intake   1670 ml  Output      0 ml  Net   1670 ml   Physical Exam: General: Obese AA male, alert, cooperative, NAD. HEENT: PERRL, EOMI. Moist mucus membranes. Right neck abscess w/ dressing, wound appears clean. Drain removed. Less tender to the touch, decreased swelling.  Neck: Full range of motion without pain, supple, no carotid bruits. Right neck swelling as above.  Lungs: Clear to ascultation bilaterally, normal work of respiration, no wheezes, rales, rhonchi Heart: RRR, no murmurs, gallops, or rubs Abdomen: Soft, non-tender, non-distended, BS + Extremities: No cyanosis, clubbing, or edema Neurologic: Alert & oriented X3, cranial nerves II-XII intact, strength grossly intact, sensation intact to light touch    Lab Results: Basic Metabolic Panel:  Recent Labs Lab 04/22/14 0508 04/23/14 0425  NA 136 136  K 3.7 4.0  CL 102 100  CO2 29 28  GLUCOSE 184* 296*  BUN 6 8  CREATININE 0.68 0.74  CALCIUM 9.0 9.0  CBC:  Recent Labs Lab 04/23/14 0425 04/24/14 0501  WBC 6.8 6.3  NEUTROABS 3.7 2.8  HGB 12.5* 11.6*  HCT 38.1* 35.5*  MCV 81.4 80.3  PLT 236 238   CBG:  Recent Labs Lab 04/23/14 0420 04/23/14 0907 04/23/14 1243 04/23/14 1654 04/23/14 2144 04/24/14 0609  GLUCAP 292* 224* 179* 132* 102* 296*     Urinalysis:  Recent Labs Lab 04/19/14 1930  COLORURINE YELLOW  LABSPEC 1.036*    PHURINE 6.0  GLUCOSEU >1000*  HGBUR NEGATIVE  BILIRUBINUR NEGATIVE  KETONESUR NEGATIVE  PROTEINUR NEGATIVE  UROBILINOGEN 1.0  NITRITE NEGATIVE  LEUKOCYTESUR NEGATIVE   Micro Results: Recent Results (from the past 240 hour(s))  MRSA PCR Screening     Status: Abnormal   Collection Time: 04/19/14 11:07 PM  Result Value Ref Range Status   MRSA by PCR POSITIVE (A) NEGATIVE Final    Comment:        The GeneXpert MRSA Assay (FDA approved for NASAL specimens only), is one component of a comprehensive MRSA colonization surveillance program. It is not intended to diagnose MRSA infection nor to guide or monitor treatment for MRSA infections. RESULT CALLED TO, READ BACK BY AND VERIFIED WITH: M.YIM,RN 0117 //13/16 M.CAMPBELL   Culture, blood (routine x 2)     Status: None (Preliminary result)   Collection Time: 04/19/14 11:50 PM  Result Value Ref Range Status   Specimen Description BLOOD RIGHT HAND  Final   Special Requests BOTTLES DRAWN AEROBIC ONLY 5CC  Final   Culture   Final           BLOOD CULTURE RECEIVED NO GROWTH TO DATE CULTURE WILL BE HELD FOR 5 DAYS BEFORE ISSUING A FINAL NEGATIVE REPORT Note: Culture results may be compromised due to an excessive volume of blood received  in culture bottles. Performed at [facility]    Report Status PENDING  Incomplete  Culture, blood (routine x 2)     Status: None (Preliminary result)   Collection Time: 04/20/14 12:00 AM  Result Value Ref Range Status   Specimen Description BLOOD LEFT ARM  Final   Special Requests BOTTLES DRAWN AEROBIC ONLY 5CC  Final   Culture   Final           BLOOD CULTURE RECEIVED NO GROWTH TO DATE CULTURE WILL BE HELD FOR 5 DAYS BEFORE ISSUING A FINAL NEGATIVE REPORT Performed at [facility]    Report Status PENDING  Incomplete  Anaerobic culture     Status: None (Preliminary result)   Collection Time: 04/22/14  4:56 PM  Result Value Ref Range Status   Specimen Description TISSUE RIGHT NECK   Final   Special Requests PATIENT ON FOLLOWING VANCO  Final   Gram Stain   Final    FEW WBC PRESENT,BOTH PMN AND MONONUCLEAR FEW GRAM POSITIVE COCCI IN PAIRS IN CLUSTERS Performed at [facility]    Culture   Final    NO ANAEROBES ISOLATED; CULTURE IN PROGRESS FOR 5 DAYS Performed at [facility]    Report Status PENDING  Incomplete  Culture, routine-abscess     Status: None (Preliminary result)   Collection Time: 04/22/14  4:56 PM  Result Value Ref Range Status   Specimen Description TISSUE RIGHT NECK  Final   Special Requests PATIENT ON FOLLOWING VANCO  Final   Gram Stain   Final    FEW WBC PRESENT,BOTH PMN AND MONONUCLEAR FEW GRAM POSITIVE COCCI IN PAIRS IN CLUSTERS Performed at [facility]    Culture   Final    ABUNDANT STAPHYLOCOCCUS AUREUS Note: RIFAMPIN AND GENTAMICIN SHOULD NOT BE USED AS SINGLE DRUGS FOR TREATMENT OF STAPH INFECTIONS. Performed at [facility]    Report Status PENDING  Incomplete    Medications: I have reviewed the patient's current medications. Scheduled Meds: . aspirin  81 mg Oral Daily  . Chlorhexidine Gluconate Cloth  6 each Topical Q0600  . dapsone  100 mg Oral Daily  . Darunavir Ethanolate  800 mg Oral Q breakfast  . dolutegravir  50 mg Oral Daily  . emtricitabine-tenofovir  1 tablet Oral Daily  . enoxaparin (LOVENOX) injection  40 mg Subcutaneous Q24H  . insulin aspart  0-5 Units Subcutaneous QHS  . insulin aspart  0-9 Units Subcutaneous TID WC  . insulin aspart  8 Units Subcutaneous TID WC  . insulin glargine  52 Units Subcutaneous QHS  . mupirocin ointment  1 application Nasal BID  . pravastatin  40 mg Oral q1800  . ritonavir  100 mg Oral Q breakfast  . testosterone  5 g Transdermal Daily  . vancomycin  1,000 mg Intravenous Q8H   Continuous Infusions:   PRN Meds:.oxyCODONE   Assessment/Plan: 49 y/o M w/ PMHx of HIV, uncontroled DM type II, HTN, HLD, admitted for hyperglycemia and right neck  abscess.   Right Neck Abscess: S/p I&D per Dr. Wilburn Cornelia. Abscess culture results significant for staph aureus, no sensitivity results back at this time.  -Change to Doxycycline 100 mg bid for total of 14 days (end date 05/03/14).  -Continue Oxycodone 10mg  q6h prn for pain.  [facility] follow up within 1 week for wound check.   DM type 2 w/ Hyperglycemia: Very poorly controlled, HbA1c >20%. CBG's still in the 200's today. Elevated Am glucose suggests insufficient basal  insulin dose. CBG trend as follows:   Recent Labs Lab 04/23/14 0907 04/23/14 1243 04/23/14 1654 04/23/14 2144 04/24/14 0609  GLUCAP 224* 179* 132* 102* 296*  -Increase Lantus to 54 units. -Continue Novolog to 8 units tid -Continue ISS-S + HS coverage -[facility] to follow patient on discharge. Will also set up [facility] for patient  HIV: Was taking Isentress, Prezista, Truvada, and Norvir at home. Also takes Dapsone. H/o non-compliance w/ [facility] follow up. Most recent CD4 120, VL 45 (02/08/14).  -ID following, appreciate recs; changed to Prezista, Norvir, Truvada, and TIVICAY.  -ID follow up on discharge.   HTN: BP 134/95 this AM.  -Consider starting lisinopril 5mg  daily in outpatient clinic.   Hyperlipidemia: Stable.  -Continue Pravastatin 40mg  daily  DVT/PE PPx: Lovenox [place]  Dispo: Anticipated discharge today.   The patient does have a current PCP (No primary care provider on file.) and does not need an [facility] hospital follow-up appointment after discharge.  The patient does not have transportation limitations that hinder transportation to clinic appointments.  .Services Needed at time of discharge: Y = Yes, Blank = No PT:   OT:   RN:   Equipment:   Other:     LOS: 5 days   Corky Sox, MD  [facility], PGY2  04/24/2014, 9:48 AM

## 2014-04-24 NOTE — Progress Notes (Signed)
Subjective: Patient states his pain and swelling continues to improve. He denies having fevers, chills, or problems eating since yesterday. His pain is "between mild and severe" and has responded to medications. Keith [name] requests that we change his wound dressing. He is eager to leave the hospital even if only for a day because he needs to pay for housing and it's his daughter's birthday today.  Objective: Vital signs in last 24 hours: Filed Vitals:   04/23/14 0153 04/23/14 0510 04/23/14 2022 04/24/14 0611  BP: 102/70 125/85 137/79 134/97  Pulse: 73 81 73 80  Temp: 98.5 F (36.9 C) 98.2 F (36.8 C) 98.2 F (36.8 C) 98.1 F (36.7 C)  TempSrc: Oral Oral Oral Oral  Resp: 18 18 18 18   Height:      Weight:      SpO2: 100% 98% 100% 97%   Weight change:   Intake/Output Summary (Last 24 hours) at 04/24/14 1006 Last data filed at 04/24/14 0004  Gross per 24 hour  Intake   1670 ml  Output      0 ml  Net   1670 ml   Constitutional: Patient lying in bed in no acute distress and cooperative with exam Nose: No erythema or drainage noted Mouth: No erythema or exudates, MMM Eyes: PERRL, EOMI, conjunctivae normal Neck: Indurated 4 cm area that is warm to touch, tender to palpation with small amount of clear secretion at small, pale center. ROM of neck limited by pain when turning to the right Cardiovascular: RRR, S1 normal, S2 normal, no murmurs, rubs, or gallops. Pulses 2+ Pulmonary/Chest: normal respiratory effort, CTAB, no wheezes, rales, or rhonchi Abdominal: Non-tender, non-distended, bowel sounds are normal, no masses, organomegaly, or guarding present Skin: Warm, dry Psychiatric: Normal mood and affect  Lab Results: Basic Metabolic Panel:  Recent Labs Lab 04/22/14 0508 04/23/14 0425  NA 136 136  K 3.7 4.0  CL 102 100  CO2 29 28  GLUCOSE 184* 296*  BUN 6 8  CREATININE 0.68 0.74  CALCIUM 9.0 9.0   Liver Function Tests: No results for input(s): AST, ALT, ALKPHOS,  BILITOT, PROT, ALBUMIN in the last 168 hours. No results for input(s): LIPASE, AMYLASE in the last 168 hours. No results for input(s): AMMONIA in the last 168 hours. CBC:  Recent Labs Lab 04/23/14 0425 04/24/14 0501  WBC 6.8 6.3  NEUTROABS 3.7 2.8  HGB 12.5* 11.6*  HCT 38.1* 35.5*  MCV 81.4 80.3  PLT 236 238   Cardiac Enzymes: No results for input(s): CKTOTAL, CKMB, CKMBINDEX, TROPONINI in the last 168 hours. BNP: No results for input(s): PROBNP in the last 168 hours. D-Dimer: No results for input(s): DDIMER in the last 168 hours. CBG:  Recent Labs Lab 04/23/14 0420 04/23/14 0907 04/23/14 1243 04/23/14 1654 04/23/14 2144 04/24/14 0609  GLUCAP 292* 224* 179* 132* 102* 296*   Hemoglobin A1C:  Recent Labs Lab 04/19/14 2350  HGBA1C >20.0*   Fasting Lipid Panel: No results for input(s): CHOL, HDL, LDLCALC, TRIG, CHOLHDL, LDLDIRECT in the last 168 hours. Thyroid Function Tests: No results for input(s): TSH, T4TOTAL, FREET4, T3FREE, THYROIDAB in the last 168 hours. Coagulation: No results for input(s): LABPROT, INR in the last 168 hours. Anemia Panel: No results for input(s): VITAMINB12, FOLATE, FERRITIN, TIBC, IRON, RETICCTPCT in the last 168 hours. Urine Drug Screen: Drugs of Abuse     Component Value Date/Time   LABOPIA NONE DETECTED 02/04/2014 0630   LABOPIA NEG 11/22/2011 1402   COCAINSCRNUR NONE DETECTED 02/04/2014  0630   COCAINSCRNUR NEG 11/22/2011 1402   LABBENZ NONE DETECTED 02/04/2014 0630   LABBENZ NEG 11/22/2011 1402   LABBENZ NEG 08/07/2011 1217   AMPHETMU NONE DETECTED 02/04/2014 0630   AMPHETMU NEG 11/22/2011 1402   AMPHETMU NEG 08/07/2011 1217   THCU NONE DETECTED 02/04/2014 0630   THCU NEG 11/22/2011 1402   LABBARB NONE DETECTED 02/04/2014 0630   LABBARB NEG 11/22/2011 1402    Alcohol Level: No results for input(s): ETH in the last 168 hours. Urinalysis:  Recent Labs Lab 04/19/14 1930  COLORURINE YELLOW  LABSPEC 1.036*  PHURINE  6.0  GLUCOSEU >1000*  HGBUR NEGATIVE  BILIRUBINUR NEGATIVE  KETONESUR NEGATIVE  PROTEINUR NEGATIVE  UROBILINOGEN 1.0  NITRITE NEGATIVE  LEUKOCYTESUR NEGATIVE    Micro Results: Recent Results (from the past 240 hour(s))  MRSA PCR Screening     Status: Abnormal   Collection Time: 04/19/14 11:07 PM  Result Value Ref Range Status   MRSA by PCR POSITIVE (A) NEGATIVE Final    Comment:        The GeneXpert MRSA Assay (FDA approved for NASAL specimens only), is one component of a comprehensive MRSA colonization surveillance program. It is not intended to diagnose MRSA infection nor to guide or monitor treatment for MRSA infections. RESULT CALLED TO, READ BACK BY AND VERIFIED WITH: M.YIM,RN 0117 //13/16 M.CAMPBELL   Culture, blood (routine x 2)     Status: None (Preliminary result)   Collection Time: 04/19/14 11:50 PM  Result Value Ref Range Status   Specimen Description BLOOD RIGHT HAND  Final   Special Requests BOTTLES DRAWN AEROBIC ONLY 5CC  Final   Culture   Final           BLOOD CULTURE RECEIVED NO GROWTH TO DATE CULTURE WILL BE HELD FOR 5 DAYS BEFORE ISSUING A FINAL NEGATIVE REPORT Note: Culture results may be compromised due to an excessive volume of blood received in culture bottles. Performed at [facility]    Report Status PENDING  Incomplete  Culture, blood (routine x 2)     Status: None (Preliminary result)   Collection Time: 04/20/14 12:00 AM  Result Value Ref Range Status   Specimen Description BLOOD LEFT ARM  Final   Special Requests BOTTLES DRAWN AEROBIC ONLY 5CC  Final   Culture   Final           BLOOD CULTURE RECEIVED NO GROWTH TO DATE CULTURE WILL BE HELD FOR 5 DAYS BEFORE ISSUING A FINAL NEGATIVE REPORT Performed at [facility]    Report Status PENDING  Incomplete  Anaerobic culture     Status: None (Preliminary result)   Collection Time: 04/22/14  4:56 PM  Result Value Ref Range Status   Specimen Description TISSUE RIGHT NECK  Final     Special Requests PATIENT ON FOLLOWING VANCO  Final   Gram Stain   Final    FEW WBC PRESENT,BOTH PMN AND MONONUCLEAR FEW GRAM POSITIVE COCCI IN PAIRS IN CLUSTERS Performed at [facility]    Culture   Final    NO ANAEROBES ISOLATED; CULTURE IN PROGRESS FOR 5 DAYS Performed at [facility]    Report Status PENDING  Incomplete  Culture, routine-abscess     Status: None (Preliminary result)   Collection Time: 04/22/14  4:56 PM  Result Value Ref Range Status   Specimen Description TISSUE RIGHT NECK  Final   Special Requests PATIENT ON FOLLOWING VANCO  Final   Gram Stain   Final  FEW WBC PRESENT,BOTH PMN AND MONONUCLEAR FEW GRAM POSITIVE COCCI IN PAIRS IN CLUSTERS Performed at [facility]    Culture   Final    ABUNDANT STAPHYLOCOCCUS AUREUS Note: RIFAMPIN AND GENTAMICIN SHOULD NOT BE USED AS SINGLE DRUGS FOR TREATMENT OF STAPH INFECTIONS. Performed at [facility]    Report Status PENDING  Incomplete   Studies/Results: No results found. Medications:  Scheduled Meds: . aspirin  81 mg Oral Daily  . Chlorhexidine Gluconate Cloth  6 each Topical Q0600  . dapsone  100 mg Oral Daily  . Darunavir Ethanolate  800 mg Oral Q breakfast  . dolutegravir  50 mg Oral Daily  . emtricitabine-tenofovir  1 tablet Oral Daily  . enoxaparin (LOVENOX) injection  40 mg Subcutaneous Q24H  . insulin aspart  0-5 Units Subcutaneous QHS  . insulin aspart  0-9 Units Subcutaneous TID WC  . insulin aspart  8 Units Subcutaneous TID WC  . insulin glargine  54 Units Subcutaneous QHS  . mupirocin ointment  1 application Nasal BID  . pravastatin  40 mg Oral q1800  . ritonavir  100 mg Oral Q breakfast  . testosterone  5 g Transdermal Daily  . vancomycin  1,000 mg Intravenous Q8H   Continuous Infusions:  PRN Meds:.oxyCODONE  Assessment/Plan: Principal Problem:   Furuncle of neck Active Problems:   AIDS   Essential hypertension   Hyperlipidemia   Hyperglycemia due  to type 2 diabetes mellitus   MRSA carrier   Testosterone deficiency   Cellulitis, neck   Neck abscess   Keith [name] is a 49 y.o. male with PMH of HIV, DM2, HTN, HL presents with R sided neck mass and R thumb abscess for the past three weeks.  **Right neck abscess s/p I&D: The differential includes furuncle, abscess, and malignancy. His CT is notable for edema at R sternocleidomastoid muscle with no definite fluid collection or abscess seen and likely reactive lymph nodes in the R level 2 and submandibular regions. He was MRSA positive on 1/12 and was started on vancomycin in the ED. Blood Cultures x 2 (obtained after vancomycin administration): NGTD -Per ENT consult note: "progressive soft tissue infection in the right lower neck with coalescence and possible abscess formation the subcutaneous tissue"  -Patient is s/p surgical procedure under anesthesia to drain this abscess and culture showing abundant Staph aureus (sensivitives pending) -Consider switching Vancomycin 1 g IV to Doxycycline 100 mg twice daily by mouth for 10 day course. Per ENT, patient may bathe on 1/20 -Continue Oxycodone 10 mg every 6 hours as needed  **Hyperglycemia in setting of poorly controlled DM2: Last A1c 02/04/14 was 14.9. Medication non-compliance is likely as he has responded well to the novolog 10 u in the ED with his sugars down from 462 to 230 on 1/12 -On 1/12 Hgb A1C >20 -Continue Lantus 52 units nigthtly -SSI: 8 units three times daily with meals and 0-5 units nightly -CBG q4h. Since 1/16 Blood glucose ranged from 102 to 296  **HIV: He is followed by Dr [name] of ID and has history of medication non-compliance -Continue norvir 100 mg daily, prezista 800 mg daily, truvada 200-300 mg daily, dapsone 100 mg daily -ID Consult: On 1/15 discontinued isentress 400 mg bid and started on tivicay 50 mg daily -On 1/12 HIV RNA: 284 -CD4 count: Pending  **HTN (resolved): On admission blood pressure elevated  at 149/93. Today blood pressure 137/79 -Continue ASA 81 mg daily -Would benefit from starting ACE inhibitor, such as lisinopril  5 mg, given DM2 on an outpatient basis  **HLD: Last lipid panel 06/02/13 cholesterol 268, triglyceride 196, HDL 43, LDL 186 -Continue pravastatin 40 mg daily  **History of Low Testosterone: On 1/13 Testosterone at 237 -Restarted on home Androgel 5 g patch daily  Diet: Carb modified  DVT PPx: Lovenox 20 mg [place] daily  Code: Full  Dispo: Disposition is deferred at this time, awaiting improvement of current medical problems. Anticipated discharge in approximately 0-1 day(s).   The patient does have a current PCP Corky Sox, MD) and does need an [facility] hospital follow-up appointment after discharge  This is a Careers information officer Note. The care of the patient was discussed with Dr. Ronnald Ramp and the assessment and plan was formulated with their assistance. Please see their note for official documentation of the patient encounter.   .Services Needed at time of discharge: Y = Yes, Blank = No PT:   OT:   RN:   Equipment:   Other:      LOS: 5 days   This is a Careers information officer Note. The care of the patient was discussed with Dr. Ronnald Ramp and the assessment and plan was formulated with their assistance. Please see their note for official documentation of the patient encounter.  Signed: Laneta Simmers, Med Student Internal Medicine Teaching Service Team B2  [phone] 04/24/2014, 10:06 AM

## 2014-04-24 NOTE — Discharge Summary (Signed)
Name: Keith [name] MRN: [id number] DOB: Aug 19, 1965 49 y.o. PCP: No primary care provider on file.  Date of Admission: 04/19/2014  5:38 PM Date of Discharge: 04/24/2014 Attending Physician: Annia Belt, MD  Discharge Diagnosis: 1. Right Neck Abscess 2. Uncontrolled DM type II 3. HIV  Discharge Medications:   Medication List    STOP taking these medications        ISENTRESS 400 MG tablet  Generic drug:  raltegravir      TAKE these medications        ACCU-CHEK NANO SMARTVIEW W/DEVICE Kit  Please check blood sugar daily and with meals     ANDROGEL 50 MG/5GM (1%) Gel  Generic drug:  testosterone  Place 5 g onto the skin daily.     aspirin 81 MG EC tablet  Take 81 mg by mouth daily.     dapsone 100 MG tablet  TAKE 1 TABLET BY MOUTH EVERY DAY     dolutegravir 50 MG tablet  Commonly known as:  TIVICAY  Take 1 tablet (50 mg total) by mouth daily.     doxycycline 100 MG tablet  Commonly known as:  VIBRA-TABS  Take 1 tablet (100 mg total) by mouth 2 (two) times daily.     Insulin Glargine 100 UNIT/ML Solostar Pen  Commonly known as:  LANTUS SOLOSTAR  Inject 60 Units into the skin daily at 10 pm.     insulin lispro 100 UNIT/ML KiwkPen  Commonly known as:  HUMALOG  Inject 0.08 mLs (8 Units total) into the skin 3 (three) times daily.     Insulin Pen Needle 31G X 5 MM Misc  1 application by Does not apply route once. diag code 250.02. Insulin dependent. Used for insulin inj 1 time daily     NORVIR 100 MG Tabs tablet  Generic drug:  ritonavir  TAKE 1 TABLET BY MOUTH EVERY DAY WITH BREAKFAST     Oxycodone HCl 10 MG Tabs  Take 1 tablet (10 mg total) by mouth 3 (three) times daily.     pravastatin 40 MG tablet  Commonly known as:  PRAVACHOL  Take 1 tablet (40 mg total) by mouth daily.     PREZISTA 800 MG tablet  Generic drug:  Darunavir Ethanolate  TAKE 1 TABLET BY MOUTH EVERY DAY WITH BREAKFAST     TRUVADA 200-300 MG per tablet  Generic drug:   emtricitabine-tenofovir  TAKE 1 TABLET BY MOUTH DAILY        Disposition and follow-up:   Keith [name] was discharged from [facility] in Good condition.  At the hospital follow up visit please address:  1.  Neck Abscess; Patient now s/p I&D of abscess, treated w/ Vancomycin in the hospital, transitioned to Doxycycline for full course of ABx. Cultures grew MRSA sensitive to tetracyclines. Is abscess improving? Fever? Chills?   DM type II; Patient w/ long h/o non-compliance. THN to see? HH? Has patient been using Insulin?  2.  Labs / imaging needed at time of follow-up: CBC  3.  Pending labs/ test needing follow-up: none  Follow-up Appointments:     Follow-up Information    Follow up with Luanne Bras, MD On 05/03/2014.   Specialty:  Internal Medicine   Why:  2:15 PM   Contact information:   [place] [address] [phone]       Discharge Instructions: Discharge Instructions    Call MD for:  redness, tenderness, or signs of infection (pain,  swelling, redness, odor or green/yellow discharge around incision site)    Complete by:  As directed      Call MD for:  temperature >100.4    Complete by:  As directed      Diet Carb Modified    Complete by:  As directed            Consultations:    Procedures Performed:  Ct Soft Tissue Neck W Contrast  04/19/2014   CLINICAL DATA:  Right lateral neck mass or abscess.  EXAM: CT NECK WITH CONTRAST  TECHNIQUE: Multidetector CT imaging of the neck was performed using the standard protocol following the bolus administration of intravenous contrast.  CONTRAST:  27m OMNIPAQUE IOHEXOL 300 MG/ML  SOLN  COMPARISON:  None.  FINDINGS: Pharynx and larynx: Normal.  Salivary glands: Normal.  Thyroid: Normal.  Lymph nodes: Mildly enlarged lymph nodes are noted bilaterally in the level 2 areas, with the largest measuring 20 x 15 mm in the right submandibular region. 16 x 14 mm lymph node is noted lateral to the  right carotid bifurcation.  Vascular: No abnormality seen.  Limited intracranial: No abnormality seen.  Mastoids and visualized paranasal sinuses: No abnormality seen.  Skeleton: No abnormality seen.  Upper chest: Normal.  Inflammatory changes and skin thickening are seen overlying the right sternocleidomastoid muscle consistent with focal cellulitis. No abscess is noted.  IMPRESSION: Probable cellulitis seen involving the subcutaneous tissues overlying the right sternocleidomastoid muscle. No definite fluid collection or abscess is seen. Significantly enlarged lymph nodes are noted in the right level 2 and submandibular regions most likely inflammatory in origin given the above findings.   Electronically Signed   By: JSabino DickM.D.   On: 04/19/2014 20:12    Admission HPI: Mr GDeeris a 49year old man with HIV, DM2, HTN, HL presents with R sided neck mass and R thumb abscess for the past three weeks. He first noticed the neck lesion when shaving and is unsure how he got it. It has gotten more swollen and painful over this time. He took a wet towel and tried to apply pressure. It has not produced any drainage. He describes the pain as a constant, sharp pain. It hurts to touch or put pressure on it. He denies any issues with breathing or swallowing. He had a subjective fever this morning.The pain was relieved with morphine in ED and percocet at home. He does not think he has had any skin infections in the past although he has had a genital wart for the past 6-7 months but it looked different.  He also notes a pustule on his R palmar surface of the thumb that developed at the same time. This lesion is also painful to touch. It was bleeding today. He denies any chills, night sweats, chest pain, SOB, abdominal pain, nausea, emesis, diarrhea, constipation.  In the ED, he was noted to have a blood sugar of 618. He reports compliance with his medications (lantus 60 u qhs, humalog 8 u tidwc) but says he has not  been eating as much. He does have documented non-compliance in EMR. He said he has polyuria and polydispia but denied any weakness, vision changes, jitteriness. His home sugar today was reportedly 250. He did not have urine ketones, anion gap of 10, and serum osmolarity of 263. He has had several admissions for hyperglycemia. he received novolog 10 u once, morphine 4 mg iv once, vancomycin 1000 mg iv, 2 L NS followed by NS @  125 cc/hr.   Hospital Course by problem list:   1. Right Neck Abscess: On admission his CT was notable for edema at the right sternocleidomastoid muscle with no definite fluid collection or abscess seen and likely reactive lymph nodes in the right level 2 and submandibular regions. He tested positive for MRSA on 1/12 and received Vancomycin 1 g IV every 8 hours for 6 days. Blood Cultures were obtained after vancomycin administration and showed no growth to date. ENT was consulted and they described his skin lesion as a "progressive soft tissue infection in the right lower neck with coalescence and possible abscess formation the subcutaneous tissue" that required surgical drainage under anesthesia. Tissue cultures grew abundant MRSA. Patient will be discharged on Doxycycline 100 mg twice daily by mouth for 10 days. His pain was controlled throughout his admission with opiates and he will be discharged on home Oxycodone 10 mg every 6 hours as needed   2. Uncontrolled DM type II: Prior to admission, patient's last Hg A1C was 14.9 on 02/04/14. He has a documented history of medication non-compliance but insists he has been compliant with home medications which include Lantus 60 units daily and SSI. Along with likely medication non-compliance, having a neck abscess may have exacerbated his hyperglycemia. Patient's blood glucose decreased to 200s with 10 units of Novolog. Patient was continued on Lantus and dose was titrated up from 30 units to 52 units daily. Patient's urinalysis was negative  for ketones making DKA less likely. In addition, HHS was less likely because his anion gap was 10 and serum osmolarity was 284 on admission. He had a sodium of 128 on presentation that was 133 when corrected for hyperglycemia. Patient will be discharged on Lantus 52 units nightly, Novolog SSI 8 units three times daily with meals, and nightly correction with Novolog 0-5 units. Mr. Fiorella was continued on home Aspirin 81 mg daily. Patient could benefit from starting on Lisinopril 5 mg for renal protection given his history of uncontrolled Type 2 DM   3. HIV: Patient is followed by Dr. [name] of Infectious Disease and has history of medication non-compliance. Prior to admission his last labs were obtain on 02/08/2014 with RNA 45 and CD4 150.  His regimen prior to admission consisted of Norvir 100 mg daily, Prezista 800 mg daily, Isentress 400 mg bid, and Truvada 200-300 mg daily. His PCP prophylaxis was Dapsone 100 mg daily. On 1/15 Infectious Disease recommended we discontinue Isentress 400 mg bid and start patient on Tivicay 50 mg daily. On 1/12 HIV RNA was 284 and CD4 count was still pending. He will be discharged on Prezista 800 mg oral daily, Norvir 100 mg oral daily, Truvada 200-300 mg oral daily, Tivicay 50 mg oral daily, and Dapsone 100 mg oral daily. Patient will need to follow up at the Infectious Disease clinic   Discharge Vitals:   BP 134/97 mmHg  Pulse 80  Temp(Src) 98.1 F (36.7 C) (Oral)  Resp 18  Ht 5' 10.08" (1.78 m)  Wt 250 lb (113.399 kg)  BMI 35.79 kg/m2  SpO2 97%  Discharge Labs:  Results for orders placed or performed during the hospital encounter of 04/19/14 (from the past 24 hour(s))  Glucose, capillary     Status: Abnormal   Collection Time: 04/23/14 12:43 PM  Result Value Ref Range   Glucose-Capillary 179 (H) 70 - 99 mg/dL  Glucose, capillary     Status: Abnormal   Collection Time: 04/23/14  4:54 PM  Result Value Ref Range  Glucose-Capillary 132 (H) 70 - 99 mg/dL    Glucose, capillary     Status: Abnormal   Collection Time: 04/23/14  9:44 PM  Result Value Ref Range   Glucose-Capillary 102 (H) 70 - 99 mg/dL  CBC with Differential     Status: Abnormal   Collection Time: 04/24/14  5:01 AM  Result Value Ref Range   WBC 6.3 4.0 - 10.5 K/uL   RBC 4.42 4.22 - 5.81 MIL/uL   Hemoglobin 11.6 (L) 13.0 - 17.0 g/dL   HCT 35.5 (L) 39.0 - 52.0 %   MCV 80.3 78.0 - 100.0 fL   MCH 26.2 26.0 - 34.0 pg   MCHC 32.7 30.0 - 36.0 g/dL   RDW 12.6 11.5 - 15.5 %   Platelets 238 150 - 400 K/uL   Neutrophils Relative % 45 43 - 77 %   Neutro Abs 2.8 1.7 - 7.7 K/uL   Lymphocytes Relative 46 12 - 46 %   Lymphs Abs 2.9 0.7 - 4.0 K/uL   Monocytes Relative 7 3 - 12 %   Monocytes Absolute 0.5 0.1 - 1.0 K/uL   Eosinophils Relative 2 0 - 5 %   Eosinophils Absolute 0.1 0.0 - 0.7 K/uL   Basophils Relative 0 0 - 1 %   Basophils Absolute 0.0 0.0 - 0.1 K/uL  Glucose, capillary     Status: Abnormal   Collection Time: 04/24/14  6:09 AM  Result Value Ref Range   Glucose-Capillary 296 (H) 70 - 99 mg/dL  Glucose, capillary     Status: Abnormal   Collection Time: 04/24/14 11:21 AM  Result Value Ref Range   Glucose-Capillary 278 (H) 70 - 99 mg/dL    Signed: Corky Sox, MD 04/24/2014, 12:34 PM    Services Ordered on Discharge: [facility] [place] RN, Aide Equipment Ordered on Discharge: none

## 2014-04-25 ENCOUNTER — Encounter (HOSPITAL_COMMUNITY): Payer: Self-pay | Admitting: Otolaryngology

## 2014-04-25 LAB — CULTURE, ROUTINE-ABSCESS

## 2014-04-25 NOTE — Progress Notes (Signed)
[name], male   DOB: 05/13/1965, 49 y.o.   MRN: [id number] Medicine attending discharge note: I attest to the accuracy of the discharge evaluation and management plan as recorded by resident physician Dr. Luanne Bras.  Clinical summary: 49 year old HIV-positive man who presented with a three-week history of an expanding, tender, right neck mass. He has also had a painful right thumb over the same interval. He is an insulin-dependent diabetic but on presentation at this time blood sugar grossly uncontrolled at 618. There is evidence for noncompliance with medication given hemoglobin A1c of 14.9% recorded in October 2015. He felt feverish at home but did not take his temperature. On initial exam by Dr. Ethelene Hal which I confirmed on my exam: Afebrile 98.2 Large, tender, indurated, mass right neck overlying the sternocleidomastoid muscle approximately 10 x 5 cm with a central pustule. No heat or erythema. No cardiac murmurs. Approximate 1 x 1 cm hard pustule right palmar aspect of the thumb  Hospital course: Total white count was normal at 8500 with 59% neutrophils. A CT scan of the soft tissues of the neck showed a large area of cellulitis involving the subcutaneous tissues overlying right sternocleidomastoid muscle no definite abscess. Lymph gland enlargement level II area largest 2 x 1.5 cm in right submandibular area and 1.6 x 1.4 cm lateral to the right.  He was started on parenteral antibiotics with vancomycin. He was hydrated and given insulin to control his high sugars. After 48 hours there was no significant improvement. We felt that he would likely benefit from incision and drainage of the abscess. Infectious disease consultant agreed. Ear nose and throat surgery consultation was obtained. Consultant agreed that incision and drainage would accelerate healing and resolution of active infection. Given the large size of the soft tissue mass, he recommended surgical incision  and drainage and debridement under general anesthesia which was done on the same day as his consultation 04/22/2014. He tolerated the procedure well. Cultures showed MRSA. He was observed for 48 hours after the surgical procedure and remained stable. Surgeon felt that he could be safely transitioned to oral antibiotics with doxycycline 100 mg twice daily for 14 days to continue through 05/03/2014. He was discharged in stable condition. There were no complications.

## 2014-04-26 LAB — CULTURE, BLOOD (ROUTINE X 2)
Culture: NO GROWTH
Culture: NO GROWTH

## 2014-04-27 ENCOUNTER — Other Ambulatory Visit: Payer: Self-pay | Admitting: Internal Medicine

## 2014-04-27 LAB — ANAEROBIC CULTURE

## 2014-05-03 ENCOUNTER — Encounter: Payer: Medicare Other | Admitting: Internal Medicine

## 2014-05-05 ENCOUNTER — Encounter: Payer: Self-pay | Admitting: *Deleted

## 2014-05-25 ENCOUNTER — Telehealth: Payer: Self-pay | Admitting: *Deleted

## 2014-05-25 ENCOUNTER — Inpatient Hospital Stay: Payer: Medicare Other | Admitting: Infectious Diseases

## 2014-05-25 NOTE — Telephone Encounter (Signed)
Very good 

## 2014-05-25 NOTE — Telephone Encounter (Signed)
Patient's voicemail is full.  Unable to leave a message re: no-showed today's visit.  Will reach out to Keith [name] as the patient has a history of noncompliance, medication regimen change, and the fact that the patient was informed of this visit upon hospital discharge last month. Landis Gandy, RN

## 2014-05-30 ENCOUNTER — Encounter (HOSPITAL_COMMUNITY): Payer: Self-pay | Admitting: Family Medicine

## 2014-05-30 ENCOUNTER — Observation Stay (HOSPITAL_COMMUNITY)
Admission: EM | Admit: 2014-05-30 | Discharge: 2014-06-01 | Disposition: A | Discharge status: Home / Self Care | Payer: Medicare Other | Attending: Family Medicine | Admitting: Family Medicine

## 2014-05-30 ENCOUNTER — Emergency Department (HOSPITAL_COMMUNITY): Payer: Medicare Other

## 2014-05-30 ENCOUNTER — Other Ambulatory Visit (HOSPITAL_COMMUNITY): Payer: Self-pay

## 2014-05-30 DIAGNOSIS — G8929 Other chronic pain: Secondary | ICD-10-CM | POA: Insufficient documentation

## 2014-05-30 DIAGNOSIS — I10 Essential (primary) hypertension: Secondary | ICD-10-CM | POA: Diagnosis present

## 2014-05-30 DIAGNOSIS — R739 Hyperglycemia, unspecified: Secondary | ICD-10-CM

## 2014-05-30 DIAGNOSIS — N521 Erectile dysfunction due to diseases classified elsewhere: Secondary | ICD-10-CM

## 2014-05-30 DIAGNOSIS — Z79899 Other long term (current) drug therapy: Secondary | ICD-10-CM | POA: Insufficient documentation

## 2014-05-30 DIAGNOSIS — S61001A Unspecified open wound of right thumb without damage to nail, initial encounter: Secondary | ICD-10-CM | POA: Insufficient documentation

## 2014-05-30 DIAGNOSIS — E875 Hyperkalemia: Secondary | ICD-10-CM | POA: Insufficient documentation

## 2014-05-30 DIAGNOSIS — E1100 Type 2 diabetes mellitus with hyperosmolarity without nonketotic hyperglycemic-hyperosmolar coma (NKHHC): Secondary | ICD-10-CM | POA: Diagnosis not present

## 2014-05-30 DIAGNOSIS — E1165 Type 2 diabetes mellitus with hyperglycemia: Secondary | ICD-10-CM | POA: Insufficient documentation

## 2014-05-30 DIAGNOSIS — E1159 Type 2 diabetes mellitus with other circulatory complications: Secondary | ICD-10-CM

## 2014-05-30 DIAGNOSIS — X58XXXA Exposure to other specified factors, initial encounter: Secondary | ICD-10-CM | POA: Insufficient documentation

## 2014-05-30 DIAGNOSIS — Z794 Long term (current) use of insulin: Secondary | ICD-10-CM | POA: Diagnosis not present

## 2014-05-30 DIAGNOSIS — E785 Hyperlipidemia, unspecified: Secondary | ICD-10-CM | POA: Diagnosis present

## 2014-05-30 DIAGNOSIS — E871 Hypo-osmolality and hyponatremia: Secondary | ICD-10-CM | POA: Insufficient documentation

## 2014-05-30 DIAGNOSIS — Z7982 Long term (current) use of aspirin: Secondary | ICD-10-CM | POA: Diagnosis not present

## 2014-05-30 DIAGNOSIS — B20 Human immunodeficiency virus [HIV] disease: Secondary | ICD-10-CM | POA: Diagnosis not present

## 2014-05-30 DIAGNOSIS — S61219A Laceration without foreign body of unspecified finger without damage to nail, initial encounter: Secondary | ICD-10-CM | POA: Diagnosis present

## 2014-05-30 LAB — URINALYSIS, ROUTINE W REFLEX MICROSCOPIC
Bilirubin Urine: NEGATIVE
Glucose, UA: >1000 mg/dL — AB
Hgb urine dipstick: NEGATIVE
Ketones, ur: NEGATIVE mg/dL
Leukocytes, UA: NEGATIVE
Nitrite: NEGATIVE
Protein, ur: NEGATIVE mg/dL
Specific Gravity, Urine: 1.033 — ABNORMAL HIGH (ref 1.005–1.030)
Urobilinogen, UA: 1 mg/dL (ref 0.0–1.0)
pH: 6 (ref 5.0–8.0)

## 2014-05-30 LAB — CBC
HCT: 39.2 % (ref 39.0–52.0)
Hemoglobin: 13.3 g/dL (ref 13.0–17.0)
MCH: 26.3 pg (ref 26.0–34.0)
MCHC: 33.9 g/dL (ref 30.0–36.0)
MCV: 77.6 fL — ABNORMAL LOW (ref 78.0–100.0)
Platelets: 215 10*3/uL (ref 150–400)
RBC: 5.05 MIL/uL (ref 4.22–5.81)
RDW: 12.7 % (ref 11.5–15.5)
WBC: 6.6 10*3/uL (ref 4.0–10.5)

## 2014-05-30 LAB — CBG MONITORING, ED
Glucose-Capillary: >600 mg/dL (ref 70–99)
Glucose-Capillary: >600 mg/dL (ref 70–99)
Glucose-Capillary: >600 mg/dL (ref 70–99)
Glucose-Capillary: 478 mg/dL — ABNORMAL HIGH (ref 70–99)
Glucose-Capillary: 321 mg/dL — ABNORMAL HIGH (ref 70–99)

## 2014-05-30 LAB — COMPREHENSIVE METABOLIC PANEL
ALT: 21 U/L (ref 0–53)
AST: 29 U/L (ref 0–37)
Albumin: 3.7 g/dL (ref 3.5–5.2)
Alkaline Phosphatase: 103 U/L (ref 39–117)
Anion gap: 11 (ref 5–15)
BUN: 15 mg/dL (ref 6–23)
CO2: 27 mmol/L (ref 19–32)
Calcium: 9.7 mg/dL (ref 8.4–10.5)
Chloride: 87 mmol/L — ABNORMAL LOW (ref 96–112)
Creatinine, Ser: 1.24 mg/dL (ref 0.50–1.35)
GFR calc Af Amer: 77 mL/min — ABNORMAL LOW (ref >90)
GFR calc non Af Amer: 67 mL/min — ABNORMAL LOW (ref >90)
Glucose, Bld: 755 mg/dL (ref 70–99)
Potassium: 5.6 mmol/L — ABNORMAL HIGH (ref 3.5–5.1)
Sodium: 125 mmol/L — ABNORMAL LOW (ref 135–145)
Total Bilirubin: 1.2 mg/dL (ref 0.3–1.2)
Total Protein: 8.2 g/dL (ref 6.0–8.3)

## 2014-05-30 LAB — URINE MICROSCOPIC-ADD ON

## 2014-05-30 MED ORDER — SODIUM CHLORIDE 0.9 % IV SOLN: INTRAVENOUS | Status: DC

## 2014-05-30 MED ORDER — LIDOCAINE-EPINEPHRINE (PF) 2 %-1:200000 IJ SOLN
10.0000 mL | Freq: Once | INTRAMUSCULAR | Status: AC
  Administered 2014-05-30: 10 mL via INTRADERMAL
  Filled 2014-05-30: qty 20

## 2014-05-30 MED ORDER — DEXTROSE-NACL 5-0.45 % IV SOLN: INTRAVENOUS | Status: DC

## 2014-05-30 MED ORDER — SODIUM CHLORIDE 0.9 % IV SOLN
1000.0000 mL | INTRAVENOUS | Status: DC
  Administered 2014-05-30: 1000 mL via INTRAVENOUS

## 2014-05-30 MED ORDER — SODIUM CHLORIDE 0.9 % IV SOLN
1000.0000 mL | Freq: Once | INTRAVENOUS | Status: AC
  Administered 2014-05-30: 1000 mL via INTRAVENOUS

## 2014-05-30 MED ORDER — SODIUM CHLORIDE 0.9 % IV SOLN
INTRAVENOUS | Status: DC
  Administered 2014-05-30: 5.4 [IU]/h via INTRAVENOUS
  Filled 2014-05-30: qty 2.5

## 2014-05-30 MED ORDER — TETANUS-DIPHTH-ACELL PERTUSSIS 5-2.5-18.5 LF-MCG/0.5 IM SUSP
0.5000 mL | Freq: Once | INTRAMUSCULAR | Status: AC
  Administered 2014-05-30: 0.5 mL via INTRAMUSCULAR
  Filled 2014-05-30: qty 0.5

## 2014-05-30 NOTE — H&P (Signed)
[facility] History and Physical  Keith [name] [id number] DOB: [birth year] DOA: 05/30/2014  Referring physician: Jeannett Senior, PA PCP: No primary care provider on file.   Chief Complaint: Hyperglycemia  HPI: Keith [name] is a 49 y.o. male presents with hyperglycemia. He states that he came into the hospital because he injured his hand and it was bleeding. In the ED he was noted to have a glucose of over 700. He states he has not been taking his medications for his diabetes for he last week. He states that he has noted increased thiorst and urination. He denies having fevers or chills. He states that he has been taking his HIV medications. Also he has chronic pain and is seeing Dr Ardelia Mems for this. Patient stats he does not smoke or drink.   Review of Systems:  Constitutional:  No weight loss, night sweats, Fevers, chills, fatigue.  HEENT:  No headaches No sneezing, itching, ear ache, nasal congestion, post nasal drip,  Cardio-vascular:  No chest pain, Orthopnea, PND, swelling in lower extremities, anasarca, dizziness, palpitations  GI:  No heartburn, indigestion, abdominal pain, nausea, vomiting, diarrhea, change in bowel habits Resp:  No shortness of breath with exertion or at rest. No excess mucus, no productive cough, No non-productive cough, No coughing up of blood Skin:  no rash or lesions.  GU:  no dysuria, change in color of urine, no urgency or frequency Musculoskeletal:  No joint pain or swelling. No decreased range of motion. ++back pain.  Psych:  No change in mood or affect. No depression or anxiety. No memory loss.   Past Medical History  Diagnosis Date  . HIV (human immunodeficiency virus infection) 2009    CD4 count 100, VL 13800 (05/01/2010)  . Diabetes type 2, uncontrolled     HgA1c 17.6 (04/27/2010)  . Hypertension   . Genital warts   . Erectile dysfunction   . Chronic knee pain     right  . Osteomyelitis     h/o hand  . Diabetes  mellitus   . Chronic pain   . AIDS    Past Surgical History  Procedure Laterality Date  . Multiple extractions with alveoloplasty N/A 01/18/2013    Procedure: MULTIPLE EXTRACION 3, 6, 7, 10, 11, 13, 21, 22, 27, 28, 29, 30 WITH ALVEOLOPLASTY;  Surgeon: Gae Bon, DDS;  Location: [facility];  Service: Oral Surgery;  Laterality: N/A;  . Hernia repair    . Minor irrigation and debridement of wound Right 04/22/2014    Procedure: IRRIGATION AND DEBRIDEMENT OF RIGHT NECK ABCESS;  Surgeon: Jerrell Belfast, MD;  Location: [facility];  Service: ENT;  Laterality: Right;   Social History:  reports that he has never smoked. He has never used smokeless tobacco. He reports that he does not drink alcohol or use illicit drugs.  Allergies  Allergen Reactions  . Sulfa Antibiotics Itching    Family History  Problem Relation Age of Onset  . Hypertension Mother   . Arthritis Father   . Hypertension Father   . Hypertension Brother   . Cancer Maternal Grandmother 61    unknown type of cancer  . Depression Paternal Grandmother      Prior to Admission medications   Medication Sig Start Date End Date Taking? Authorizing Provider  aspirin 81 MG EC tablet Take 81 mg by mouth daily.  08/20/11  Yes [name], MD  dapsone 100 MG tablet TAKE 1 TABLET BY MOUTH EVERY DAY 03/28/14  Yes Doroteo Bradford  [name], MD  dolutegravir (TIVICAY) 50 MG tablet Take 1 tablet (50 mg total) by mouth daily. 04/24/14  Yes Corky Sox, MD  Insulin Glargine (LANTUS SOLOSTAR) 100 UNIT/ML Solostar Pen Inject 60 Units into the skin daily at 10 pm. 02/17/14  Yes Jacques Earthly, MD  insulin lispro (HUMALOG) 100 UNIT/ML KiwkPen Inject 0.08 mLs (8 Units total) into the skin 3 (three) times daily. 02/21/14  Yes Jacques Earthly, MD  NORVIR 100 MG TABS tablet TAKE 1 TABLET BY MOUTH EVERY DAY WITH BREAKFAST 03/28/14  Yes [name], MD  Oxycodone HCl 10 MG TABS Take 1 tablet (10 mg total) by mouth 3 (three) times daily. 04/24/14  Yes Corky Sox, MD  pravastatin (PRAVACHOL) 40 MG tablet Take 1 tablet (40 mg total) by mouth daily. 01/03/14  Yes Karren Cobble, MD  PREZISTA 800 MG tablet TAKE 1 TABLET BY MOUTH EVERY DAY WITH BREAKFAST 03/28/14  Yes [name], MD  testosterone (ANDROGEL) 50 MG/5GM GEL Place 5 g onto the skin daily.    Yes Historical Provider, MD  TRUVADA 200-300 MG per tablet TAKE 1 TABLET BY MOUTH DAILY 03/28/14  Yes [name], MD  doxycycline (VIBRA-TABS) 100 MG tablet Take 1 tablet (100 mg total) by mouth 2 (two) times daily. Patient not taking: Reported on 05/30/2014 04/24/14   Corky Sox, MD   Physical Exam: Filed Vitals:   05/30/14 2000 05/30/14 2156 05/30/14 2200 05/30/14 2215  BP: 128/90 118/93 107/69 101/73  Pulse: 82 97 117 92  Temp:      Resp:  18    SpO2: 98% 100% 100% 99%    Wt Readings from Last 3 Encounters:  04/20/14 113.399 kg (250 lb)  03/10/14 122.335 kg (269 lb 11.2 oz)  02/28/14 117.935 kg (260 lb)    General:  Appears calm and comfortable Eyes: PERRL, normal lids, irises & conjunctiva ENT: grossly normal hearing, lips & tongue Neck: no LAD, masses or thyromegaly Cardiovascular: RRR, no m/r/g. No LE edema. Respiratory: CTA bilaterally, no w/r/r. Normal respiratory effort. Abdomen: soft, ntnd Skin: superficial injury to the right hand Musculoskeletal: grossly normal tone BUE/BLE Psychiatric: grossly normal mood and affect, speech fluent and appropriate Neurologic: grossly non-focal.          Labs on Admission:  Basic Metabolic Panel:  Recent Labs Lab 05/30/14 1434  NA 125*  K 5.6*  CL 87*  CO2 27  GLUCOSE 755*  BUN 15  CREATININE 1.24  CALCIUM 9.7   Liver Function Tests:  Recent Labs Lab 05/30/14 1434  AST 29  ALT 21  ALKPHOS 103  BILITOT 1.2  PROT 8.2  ALBUMIN 3.7   No results for input(s): LIPASE, AMYLASE in the last 168 hours. No results for input(s): AMMONIA in the last 168 hours. CBC:  Recent Labs Lab 05/30/14 1434  WBC 6.6    HGB 13.3  HCT 39.2  MCV 77.6*  PLT 215   Cardiac Enzymes: No results for input(s): CKTOTAL, CKMB, CKMBINDEX, TROPONINI in the last 168 hours.  BNP (last 3 results) No results for input(s): BNP in the last 8760 hours.  ProBNP (last 3 results) No results for input(s): PROBNP in the last 8760 hours.  CBG:  Recent Labs Lab 05/30/14 1421 05/30/14 2104 05/30/14 2215  GLUCAP >600* >600* 478*    Radiological Exams on Admission: Dg Finger Thumb Right  05/30/2014   CLINICAL DATA:  Wound on thumb  EXAM: RIGHT THUMB 2+V  COMPARISON:  None.  FINDINGS: No acute fracture. No dislocation. No destructive bone lesion. There is chronic appearing periosteum reaction along the tuft of the distal phalanx of the thumb.  IMPRESSION: No acute bony pathology.  Chronic change.   Electronically Signed   By: Marybelle Killings M.D.   On: 05/30/2014 20:50      Assessment/Plan Active Problems:   AIDS   Essential hypertension   Hyperlipidemia   Hyperglycemia   1. Hyperglycemia -will start on glucostabilizer -monitor FSBS -hydrate with IV Fluids per protocol -check A1C -counseled on compliance with medications  2. AIDS/HIV -will continue with his antiretrovirals  -states he goes to HIV clinic regularly  3. HTN -will continue with home medications  4. Hyperlipidemia -continue with statins  5. Hand wound -appears to be superficial -will monitor  6. Microcytosis -will check iron studies    Code Status: Full Code (must indicate code status--if unknown or must be presumed, indicate so) DVT Prophylaxis:Heparin Family Communication: None (indicate person spoken with, if applicable, with phone number if by telephone) Disposition Plan: Home (indicate anticipated LOS)  Time spent: 7min  Chianti Goh A [facility] Pager [phone]

## 2014-05-30 NOTE — ED Notes (Signed)
Patient talking with PA.

## 2014-05-30 NOTE — ED Notes (Signed)
CBG >600 °

## 2014-05-30 NOTE — ED Notes (Signed)
Pt here for hyperglycemia and old wound to right thumb. Slow bleeding.

## 2014-05-30 NOTE — ED Notes (Signed)
Admitting Physician at the bedside.  

## 2014-05-30 NOTE — ED Notes (Signed)
Patient arrived to E44 at this time. NT at the bedside placing patient on the monitor.

## 2014-05-30 NOTE — ED Provider Notes (Signed)
CSN: [id number]     Arrival date & time 05/30/14  1359 History   First MD Initiated Contact with Patient 05/30/14 1911     Chief Complaint  Patient presents with  . Hyperglycemia     (Consider location/radiation/quality/duration/timing/severity/associated sxs/prior Treatment) HPI Keith [name] is a 49 y.o. male with hx of DM, HIV, HTN, who presents to ED with complaint of hyperglycemia and right thumb injury. Pt states he ran out of insulin needles and has not had his medications for about a week. He admits to polyuria and polydipsia, denies generalized malaise, no n/v, no abdominal pain, no headache. His other complaint is right thumb injury. States "I had a bump on it for about a week," states he hit it on something today, states "now it wont stop bleeding."  Pt reports pain to the wound. Not sure what he hit it on.   Past Medical History  Diagnosis Date  . HIV (human immunodeficiency virus infection) 2009    CD4 count 100, VL 13800 (05/01/2010)  . Diabetes type 2, uncontrolled     HgA1c 17.6 (04/27/2010)  . Hypertension   . Genital warts   . Erectile dysfunction   . Chronic knee pain     right  . Osteomyelitis     h/o hand  . Diabetes mellitus   . Chronic pain   . AIDS    Past Surgical History  Procedure Laterality Date  . Multiple extractions with alveoloplasty N/A 01/18/2013    Procedure: MULTIPLE EXTRACION 3, 6, 7, 10, 11, 13, 21, 22, 27, 28, 29, 30 WITH ALVEOLOPLASTY;  Surgeon: Gae Bon, DDS;  Location: [facility];  Service: Oral Surgery;  Laterality: N/A;  . Hernia repair    . Minor irrigation and debridement of wound Right 04/22/2014    Procedure: IRRIGATION AND DEBRIDEMENT OF RIGHT NECK ABCESS;  Surgeon: Jerrell Belfast, MD;  Location: [facility] OR;  Service: ENT;  Laterality: Right;   Family History  Problem Relation Age of Onset  . Hypertension Mother   . Arthritis Father   . Hypertension Father   . Hypertension Brother   . Cancer Maternal Grandmother 60    unknown  type of cancer  . Depression Paternal Grandmother    History  Substance Use Topics  . Smoking status: Never Smoker   . Smokeless tobacco: Never Used  . Alcohol Use: No    Review of Systems  Constitutional: Negative for fever and chills.  Respiratory: Negative for cough, chest tightness and shortness of breath.   Cardiovascular: Negative for chest pain, palpitations and leg swelling.  Gastrointestinal: Negative for nausea, vomiting, abdominal pain, diarrhea and abdominal distention.  Endocrine: Positive for polydipsia and polyuria.  Genitourinary: Negative for dysuria.  Musculoskeletal: Negative for myalgias, arthralgias, neck pain and neck stiffness.  Skin: Positive for wound. Negative for rash.  Allergic/Immunologic: Negative for immunocompromised state.  Neurological: Negative for dizziness, weakness, light-headedness, numbness and headaches.  All other systems reviewed and are negative.     Allergies  Sulfa antibiotics  Home Medications   Prior to Admission medications   Medication Sig Start Date End Date Taking? Authorizing Provider  aspirin 81 MG EC tablet Take 81 mg by mouth daily.  08/20/11  Yes [name], MD  dapsone 100 MG tablet TAKE 1 TABLET BY MOUTH EVERY DAY 03/28/14  Yes [name], MD  dolutegravir (TIVICAY) 50 MG tablet Take 1 tablet (50 mg total) by mouth daily. 04/24/14  Yes Corky Sox, MD  Insulin  Glargine (LANTUS SOLOSTAR) 100 UNIT/ML Solostar Pen Inject 60 Units into the skin daily at 10 pm. 02/17/14  Yes Jacques Earthly, MD  insulin lispro (HUMALOG) 100 UNIT/ML KiwkPen Inject 0.08 mLs (8 Units total) into the skin 3 (three) times daily. 02/21/14  Yes Jacques Earthly, MD  NORVIR 100 MG TABS tablet TAKE 1 TABLET BY MOUTH EVERY DAY WITH BREAKFAST 03/28/14  Yes [name], MD  Oxycodone HCl 10 MG TABS Take 1 tablet (10 mg total) by mouth 3 (three) times daily. 04/24/14  Yes Corky Sox, MD  pravastatin (PRAVACHOL) 40 MG tablet Take 1  tablet (40 mg total) by mouth daily. 01/03/14  Yes Karren Cobble, MD  PREZISTA 800 MG tablet TAKE 1 TABLET BY MOUTH EVERY DAY WITH BREAKFAST 03/28/14  Yes [name], MD  testosterone (ANDROGEL) 50 MG/5GM GEL Place 5 g onto the skin daily.    Yes Historical Provider, MD  TRUVADA 200-300 MG per tablet TAKE 1 TABLET BY MOUTH DAILY 03/28/14  Yes [name], MD  doxycycline (VIBRA-TABS) 100 MG tablet Take 1 tablet (100 mg total) by mouth 2 (two) times daily. Patient not taking: Reported on 05/30/2014 04/24/14   Corky Sox, MD   BP 128/90 mmHg  Pulse 82  Temp(Src) 97.6 F (36.4 C)  Resp 16  SpO2 98% Physical Exam  Constitutional: He appears well-developed and well-nourished. No distress.  HENT:  Head: Normocephalic and atraumatic.  Eyes: Conjunctivae are normal.  Neck: Neck supple.  Cardiovascular: Normal rate, regular rhythm and normal heart sounds.   Pulmonary/Chest: Effort normal. No respiratory distress. He has no wheezes. He has no rales.  Abdominal: Soft. Bowel sounds are normal. He exhibits no distension. There is no tenderness. There is no rebound.  Musculoskeletal: He exhibits no edema.  Neurological: He is alert.  Skin: Skin is warm and dry.  Small, <1cm flap laceration to the palmar surface of proximal phalanx of the right thumb. Bleeding. Cap refill <2 sec.   Nursing note and vitals reviewed.   ED Course  Procedures (including critical care time) Labs Review Labs Reviewed  CBC - Abnormal; Notable for the following:    MCV 77.6 (*)    All other components within normal limits  COMPREHENSIVE METABOLIC PANEL - Abnormal; Notable for the following:    Sodium 125 (*)    Potassium 5.6 (*)    Chloride 87 (*)    Glucose, Bld 755 (*)    GFR calc non Af Amer 67 (*)    GFR calc Af Amer 77 (*)    All other components within normal limits  URINALYSIS, ROUTINE W REFLEX MICROSCOPIC - Abnormal; Notable for the following:    Specific Gravity, Urine 1.033 (*)     Glucose, UA >1000 (*)    All other components within normal limits  CBG MONITORING, ED - Abnormal; Notable for the following:    Glucose-Capillary >600 (*)    All other components within normal limits  CBG MONITORING, ED - Abnormal; Notable for the following:    Glucose-Capillary >600 (*)    All other components within normal limits  URINE MICROSCOPIC-ADD ON    Imaging Review Dg Finger Thumb Right  05/30/2014   CLINICAL DATA:  Wound on thumb  EXAM: RIGHT THUMB 2+V  COMPARISON:  None.  FINDINGS: No acute fracture. No dislocation. No destructive bone lesion. There is chronic appearing periosteum reaction along the tuft of the distal phalanx of the thumb.  IMPRESSION: No acute bony pathology.  Chronic change.   Electronically Signed   By: Marybelle Killings M.D.   On: 05/30/2014 20:50     EKG Interpretation None       MDM   Final diagnoses:  Hyperglycemia  Finger laceration, initial encounter    Pt with hyperglycemia, CBG >600. Also right thumb wound that is bleeding. Pressure applied in triage. Pt has no other complaints. Does admit to polyuria and polydipsia. Will get labs, wound explored and piece of granulomous tissue that was hanging off right thumb removed. Glucose stabilizer started.   10:30 PM Patient's glucose is 755. No evidence of DKA, anion gap is 11. Will admit for hyperglycemia. 5.6, sodium 125, chloride 87.   Patient's wound cleaned with iodine and sterile saline. Numbed using 2%lidocaine w/epi. Piece of granulomatous tissue removed. Bovie cautery used to stop bleeding.    Spoke with [facility], will admit.   Filed Vitals:   05/30/14 2215 05/30/14 2230 05/30/14 2315 05/30/14 2330  BP: 101/73 121/80 121/77 101/72  Pulse: 92 75 85 85  Temp:      Resp:  18 15 15   SpO2: 99% 97% 97% 98%     Renold Genta, PA-C 05/30/14 2349  Charlesetta Shanks, MD 06/01/14 [phone]

## 2014-05-31 ENCOUNTER — Encounter (HOSPITAL_COMMUNITY): Payer: Self-pay | Admitting: *Deleted

## 2014-05-31 DIAGNOSIS — B20 Human immunodeficiency virus [HIV] disease: Secondary | ICD-10-CM | POA: Diagnosis not present

## 2014-05-31 DIAGNOSIS — E785 Hyperlipidemia, unspecified: Secondary | ICD-10-CM | POA: Diagnosis not present

## 2014-05-31 DIAGNOSIS — I10 Essential (primary) hypertension: Secondary | ICD-10-CM | POA: Diagnosis not present

## 2014-05-31 DIAGNOSIS — E1100 Type 2 diabetes mellitus with hyperosmolarity without nonketotic hyperglycemic-hyperosmolar coma (NKHHC): Secondary | ICD-10-CM | POA: Diagnosis not present

## 2014-05-31 DIAGNOSIS — R739 Hyperglycemia, unspecified: Secondary | ICD-10-CM | POA: Diagnosis not present

## 2014-05-31 LAB — VITAMIN B12: Vitamin B-12: 773 pg/mL (ref 211–911)

## 2014-05-31 LAB — CBC
HCT: 37.7 % — ABNORMAL LOW (ref 39.0–52.0)
HCT: 37.8 % — ABNORMAL LOW (ref 39.0–52.0)
Hemoglobin: 12.7 g/dL — ABNORMAL LOW (ref 13.0–17.0)
Hemoglobin: 12.8 g/dL — ABNORMAL LOW (ref 13.0–17.0)
MCH: 26 pg (ref 26.0–34.0)
MCH: 26.1 pg (ref 26.0–34.0)
MCHC: 33.7 g/dL (ref 30.0–36.0)
MCHC: 33.9 g/dL (ref 30.0–36.0)
MCV: 77 fL — ABNORMAL LOW (ref 78.0–100.0)
MCV: 77.1 fL — ABNORMAL LOW (ref 78.0–100.0)
Platelets: 205 10*3/uL (ref 150–400)
Platelets: 206 10*3/uL (ref 150–400)
RBC: 4.89 MIL/uL (ref 4.22–5.81)
RBC: 4.91 MIL/uL (ref 4.22–5.81)
RDW: 12.6 % (ref 11.5–15.5)
RDW: 12.7 % (ref 11.5–15.5)
WBC: 4.8 10*3/uL (ref 4.0–10.5)
WBC: 5 10*3/uL (ref 4.0–10.5)

## 2014-05-31 LAB — COMPREHENSIVE METABOLIC PANEL
ALT: 20 U/L (ref 0–53)
AST: 22 U/L (ref 0–37)
Albumin: 3.6 g/dL (ref 3.5–5.2)
Alkaline Phosphatase: 82 U/L (ref 39–117)
Anion gap: 7 (ref 5–15)
BUN: 15 mg/dL (ref 6–23)
CO2: 30 mmol/L (ref 19–32)
Calcium: 9.5 mg/dL (ref 8.4–10.5)
Chloride: 100 mmol/L (ref 96–112)
Creatinine, Ser: 1.07 mg/dL (ref 0.50–1.35)
GFR calc Af Amer: >90 mL/min (ref >90)
GFR calc non Af Amer: 80 mL/min — ABNORMAL LOW (ref >90)
Glucose, Bld: 132 mg/dL — ABNORMAL HIGH (ref 70–99)
Potassium: 3.8 mmol/L (ref 3.5–5.1)
Sodium: 137 mmol/L (ref 135–145)
Total Bilirubin: 0.5 mg/dL (ref 0.3–1.2)
Total Protein: 7.6 g/dL (ref 6.0–8.3)

## 2014-05-31 LAB — GLUCOSE, CAPILLARY
Glucose-Capillary: 167 mg/dL — ABNORMAL HIGH (ref 70–99)
Glucose-Capillary: 187 mg/dL — ABNORMAL HIGH (ref 70–99)
Glucose-Capillary: 182 mg/dL — ABNORMAL HIGH (ref 70–99)
Glucose-Capillary: 154 mg/dL — ABNORMAL HIGH (ref 70–99)
Glucose-Capillary: 94 mg/dL (ref 70–99)
Glucose-Capillary: 287 mg/dL — ABNORMAL HIGH (ref 70–99)
Glucose-Capillary: 280 mg/dL — ABNORMAL HIGH (ref 70–99)
Glucose-Capillary: 257 mg/dL — ABNORMAL HIGH (ref 70–99)

## 2014-05-31 LAB — CBG MONITORING, ED
Glucose-Capillary: 132 mg/dL — ABNORMAL HIGH (ref 70–99)
Glucose-Capillary: 219 mg/dL — ABNORMAL HIGH (ref 70–99)
Glucose-Capillary: 282 mg/dL — ABNORMAL HIGH (ref 70–99)

## 2014-05-31 LAB — CREATININE, SERUM
Creatinine, Ser: 0.9 mg/dL (ref 0.50–1.35)
GFR calc Af Amer: >90 mL/min (ref >90)
GFR calc non Af Amer: >90 mL/min (ref >90)

## 2014-05-31 LAB — IRON AND TIBC
Iron: 48 ug/dL (ref 42–165)
Saturation Ratios: 17 % — ABNORMAL LOW (ref 20–55)
TIBC: 276 ug/dL (ref 215–435)
UIBC: 228 ug/dL (ref 125–400)

## 2014-05-31 LAB — FERRITIN: Ferritin: 831 ng/mL — ABNORMAL HIGH (ref 22–322)

## 2014-05-31 LAB — MRSA PCR SCREENING: MRSA by PCR: NEGATIVE

## 2014-05-31 LAB — TSH: TSH: 2.079 u[IU]/mL (ref 0.350–4.500)

## 2014-05-31 MED ORDER — ACETAMINOPHEN 650 MG RE SUPP: 650.0000 mg | Freq: Four times a day (QID) | RECTAL | Status: DC | PRN

## 2014-05-31 MED ORDER — EMTRICITABINE-TENOFOVIR DF 200-300 MG PO TABS
1.0000 | ORAL_TABLET | Freq: Every day | ORAL | Status: DC
  Administered 2014-05-31 – 2014-06-01 (×2): 1 via ORAL
  Filled 2014-05-31 (×2): qty 1

## 2014-05-31 MED ORDER — DEXTROSE-NACL 5-0.45 % IV SOLN
INTRAVENOUS | Status: DC
  Administered 2014-05-31: 03:00:00 via INTRAVENOUS

## 2014-05-31 MED ORDER — SODIUM CHLORIDE 0.9 % IV SOLN: INTRAVENOUS | Status: DC

## 2014-05-31 MED ORDER — DEXTROSE 50 % IV SOLN: 25.0000 mL | INTRAVENOUS | Status: DC | PRN

## 2014-05-31 MED ORDER — TESTOSTERONE 50 MG/5GM (1%) TD GEL
5.0000 g | Freq: Every day | TRANSDERMAL | Status: DC
  Administered 2014-05-31 – 2014-06-01 (×2): 5 g via TRANSDERMAL
  Filled 2014-05-31 (×2): qty 5

## 2014-05-31 MED ORDER — INSULIN GLARGINE 100 UNIT/ML ~~LOC~~ SOLN
40.0000 [IU] | Freq: Every day | SUBCUTANEOUS | Status: DC
  Administered 2014-05-31 – 2014-06-01 (×2): 40 [IU] via SUBCUTANEOUS
  Filled 2014-05-31 (×2): qty 0.4

## 2014-05-31 MED ORDER — ASPIRIN EC 81 MG PO TBEC
81.0000 mg | DELAYED_RELEASE_TABLET | Freq: Every day | ORAL | Status: DC
  Administered 2014-05-31 – 2014-06-01 (×2): 81 mg via ORAL
  Filled 2014-05-31 (×3): qty 1

## 2014-05-31 MED ORDER — INSULIN ASPART 100 UNIT/ML ~~LOC~~ SOLN
0.0000 [IU] | Freq: Three times a day (TID) | SUBCUTANEOUS | Status: DC
  Administered 2014-05-31 (×2): 8 [IU] via SUBCUTANEOUS
  Administered 2014-06-01 (×2): 11 [IU] via SUBCUTANEOUS

## 2014-05-31 MED ORDER — FOLIC ACID 1 MG PO TABS
1.0000 mg | ORAL_TABLET | Freq: Every day | ORAL | Status: DC
  Administered 2014-05-31 – 2014-06-01 (×2): 1 mg via ORAL
  Filled 2014-05-31 (×2): qty 1

## 2014-05-31 MED ORDER — RITONAVIR 100 MG PO TABS
100.0000 mg | ORAL_TABLET | Freq: Every day | ORAL | Status: DC
  Administered 2014-05-31 – 2014-06-01 (×2): 100 mg via ORAL
  Filled 2014-05-31 (×2): qty 1

## 2014-05-31 MED ORDER — PRAVASTATIN SODIUM 40 MG PO TABS
40.0000 mg | ORAL_TABLET | Freq: Every day | ORAL | Status: DC
Start: 2014-05-31 — End: 2014-06-01
  Administered 2014-05-31 – 2014-06-01 (×2): 40 mg via ORAL
  Filled 2014-05-31 (×2): qty 1

## 2014-05-31 MED ORDER — SODIUM CHLORIDE 0.9 % IJ SOLN
3.0000 mL | Freq: Two times a day (BID) | INTRAMUSCULAR | Status: DC
  Administered 2014-05-31: 3 mL via INTRAVENOUS

## 2014-05-31 MED ORDER — INSULIN REGULAR BOLUS VIA INFUSION
0.0000 [IU] | Freq: Three times a day (TID) | INTRAVENOUS | Status: DC
  Administered 2014-05-31: 4.7 [IU] via INTRAVENOUS
  Filled 2014-05-31: qty 10

## 2014-05-31 MED ORDER — VITAMIN B-1 100 MG PO TABS
100.0000 mg | ORAL_TABLET | Freq: Every day | ORAL | Status: DC
  Administered 2014-05-31 – 2014-06-01 (×2): 100 mg via ORAL
  Filled 2014-05-31 (×2): qty 1

## 2014-05-31 MED ORDER — ONDANSETRON HCL 4 MG PO TABS: 4.0000 mg | ORAL_TABLET | Freq: Four times a day (QID) | ORAL | Status: DC | PRN

## 2014-05-31 MED ORDER — ADULT MULTIVITAMIN W/MINERALS CH
1.0000 | ORAL_TABLET | Freq: Every day | ORAL | Status: DC
  Administered 2014-05-31 – 2014-06-01 (×2): 1 via ORAL
  Filled 2014-05-31 (×2): qty 1

## 2014-05-31 MED ORDER — ONDANSETRON HCL 4 MG/2ML IJ SOLN: 4.0000 mg | Freq: Four times a day (QID) | INTRAMUSCULAR | Status: DC | PRN

## 2014-05-31 MED ORDER — DAPSONE 100 MG PO TABS
100.0000 mg | ORAL_TABLET | Freq: Every day | ORAL | Status: DC
  Administered 2014-05-31 – 2014-06-01 (×2): 100 mg via ORAL
  Filled 2014-05-31 (×2): qty 1

## 2014-05-31 MED ORDER — DOLUTEGRAVIR SODIUM 50 MG PO TABS
50.0000 mg | ORAL_TABLET | Freq: Every day | ORAL | Status: DC
  Administered 2014-05-31 – 2014-06-01 (×2): 50 mg via ORAL
  Filled 2014-05-31 (×2): qty 1

## 2014-05-31 MED ORDER — HEPARIN SODIUM (PORCINE) 5000 UNIT/ML IJ SOLN
5000.0000 [IU] | Freq: Three times a day (TID) | INTRAMUSCULAR | Status: DC
  Administered 2014-05-31 – 2014-06-01 (×4): 5000 [IU] via SUBCUTANEOUS
  Filled 2014-05-31 (×5): qty 1

## 2014-05-31 MED ORDER — DARUNAVIR ETHANOLATE 800 MG PO TABS
800.0000 mg | ORAL_TABLET | Freq: Every day | ORAL | Status: DC
  Administered 2014-05-31 – 2014-06-01 (×2): 800 mg via ORAL
  Filled 2014-05-31 (×4): qty 1

## 2014-05-31 MED ORDER — OXYCODONE HCL 5 MG PO TABS
10.0000 mg | ORAL_TABLET | Freq: Three times a day (TID) | ORAL | Status: DC
  Administered 2014-05-31 – 2014-06-01 (×5): 10 mg via ORAL
  Filled 2014-05-31 (×8): qty 2

## 2014-05-31 MED ORDER — ACETAMINOPHEN 325 MG PO TABS
650.0000 mg | ORAL_TABLET | Freq: Four times a day (QID) | ORAL | Status: DC | PRN
  Filled 2014-05-31: qty 2

## 2014-05-31 MED ORDER — SODIUM CHLORIDE 0.9 % IV SOLN
INTRAVENOUS | Status: DC
  Administered 2014-05-31: 12:00:00 via INTRAVENOUS

## 2014-05-31 NOTE — Progress Notes (Addendum)
CCMD notified of patient showing asystole. RN checked on patient numerous times, patient was resting in room, patient had taken leads off multiple times. RN replaced leads. Will continue to monitor patient closely. Call bell within patient's reach

## 2014-05-31 NOTE — ED Notes (Signed)
Attempted to call report to [phone]

## 2014-05-31 NOTE — Progress Notes (Signed)
[facility] PROGRESS NOTE  Keith [name] [id number] DOB: 1965-10-21 DOA: 05/30/2014 PCP: No primary care provider on file.  Assessment/Plan: 1-Hyperosmolar Hyperglycemic state:  Blood sugar at 755, sodium 125, K at 5.6. Gap at 11. Will transition insulin Gtt to lantus 40 units daily, SSS.  Resume diet.   2-hyponatremia; pseudohyponatremia in setting of hyperglycemia. Resolved.  3-Hyperkalemia; corrected with insulin.  4-AIDS/HIV continue with his antiretrovirals  5-Hand wound -appears to be superficial -will monitor  Code Status: full code.  Family Communication: care discussed with patient.  Disposition Plan: home probably 2-24   Consultants:  none  Procedures:  none  Antibiotics:  none  HPI/Subjective: Patient complaining of pain right thumb.  No chest pain, an=bdominal pain.   Objective: Filed Vitals:   05/31/14 1055  BP: 115/81  Pulse: 82  Temp: 97.8 F (36.6 C)  Resp: 18    Intake/Output Summary (Last 24 hours) at 05/31/14 1426 Last data filed at 05/31/14 0315  Gross per 24 hour  Intake    700 ml  Output    425 ml  Net    275 ml   There were no vitals filed for this visit.  Exam:   General:  Alert in no distress.   Cardiovascular: S 1, S 2 RRR  Respiratory: CTA  Abdomen: BS present, soft, nt  Musculoskeletal: no edema.    Data Reviewed: Basic Metabolic Panel:  Recent Labs Lab 05/30/14 1434 05/31/14 0301  NA 125* 137  K 5.6* 3.8  CL 87* 100  CO2 27 30  GLUCOSE 755* 132*  BUN 15 15  CREATININE 1.24 1.07  0.90  CALCIUM 9.7 9.5   Liver Function Tests:  Recent Labs Lab 05/30/14 1434 05/31/14 0301  AST 29 22  ALT 21 20  ALKPHOS 103 82  BILITOT 1.2 0.5  PROT 8.2 7.6  ALBUMIN 3.7 3.6   No results for input(s): LIPASE, AMYLASE in the last 168 hours. No results for input(s): AMMONIA in the last 168 hours. CBC:  Recent Labs Lab 05/30/14 1434 05/31/14 0301  WBC 6.6 4.8  5.0  HGB 13.3 12.8*  12.7*  HCT  39.2 37.8*  37.7*  MCV 77.6* 77.0*  77.1*  PLT 215 205  206   Cardiac Enzymes: No results for input(s): CKTOTAL, CKMB, CKMBINDEX, TROPONINI in the last 168 hours. BNP (last 3 results) No results for input(s): BNP in the last 8760 hours.  ProBNP (last 3 results) No results for input(s): PROBNP in the last 8760 hours.  CBG:  Recent Labs Lab 05/31/14 0556 05/31/14 0712 05/31/14 0852 05/31/14 1009 05/31/14 1222  GLUCAP 187* 182* 154* 94 287*    Recent Results (from the past 240 hour(s))  MRSA PCR Screening     Status: None   Collection Time: 05/31/14  9:15 AM  Result Value Ref Range Status   MRSA by PCR NEGATIVE NEGATIVE Final    Comment:        The GeneXpert MRSA Assay (FDA approved for NASAL specimens only), is one component of a comprehensive MRSA colonization surveillance program. It is not intended to diagnose MRSA infection nor to guide or monitor treatment for MRSA infections.      Studies: Dg Finger Thumb Right  05/30/2014   CLINICAL DATA:  Wound on thumb  EXAM: RIGHT THUMB 2+V  COMPARISON:  None.  FINDINGS: No acute fracture. No dislocation. No destructive bone lesion. There is chronic appearing periosteum reaction along the tuft of the distal phalanx of the thumb.  IMPRESSION:  No acute bony pathology.  Chronic change.   Electronically Signed   By: Marybelle Killings M.D.   On: 05/30/2014 20:50    Scheduled Meds: . aspirin EC  81 mg Oral Daily  . dapsone  100 mg Oral Daily  . Darunavir Ethanolate  800 mg Oral Q breakfast  . dolutegravir  50 mg Oral Daily  . emtricitabine-tenofovir  1 tablet Oral Daily  . folic acid  1 mg Oral Daily  . heparin  5,000 Units Subcutaneous 3 times per day  . insulin aspart  0-15 Units Subcutaneous TID WC  . insulin glargine  40 Units Subcutaneous Daily  . multivitamin with minerals  1 tablet Oral Daily  . oxyCODONE  10 mg Oral TID  . pravastatin  40 mg Oral Daily  . ritonavir  100 mg Oral Q breakfast  . sodium chloride  3 mL  Intravenous Q12H  . testosterone  5 g Transdermal Daily  . thiamine  100 mg Oral Daily   Continuous Infusions: . sodium chloride 100 mL/hr at 05/31/14 1130    Active Problems:   AIDS   Essential hypertension   Hyperlipidemia   Hyperglycemia    Time spent: 35 minutes.     Niel Hummer A  [facility] Pager [phone]. If 7PM-7AM, please contact night-coverage at [email or web address], password [facility] 05/31/2014, 2:26 PM  LOS: 0 days

## 2014-05-31 NOTE — ED Notes (Signed)
Phlebotomy at the bedside  

## 2014-05-31 NOTE — ED Notes (Signed)
hospitalist paged

## 2014-05-31 NOTE — ED Notes (Signed)
Verified orders, Dr. Humphrey Rolls parameters show to change fluids once cbg is less than 150.

## 2014-05-31 NOTE — ED Notes (Signed)
Explained to the patient he is currently NPO while still on insulin drip.

## 2014-05-31 NOTE — ED Notes (Signed)
Explained to patient awaiting to hear from provider, and pain med has not arrived from pharmacy yet.

## 2014-05-31 NOTE — ED Notes (Signed)
Discussed plan of care with provider Schorr, npo until insulin drip is continued. Discussed admit order for bed placement.

## 2014-05-31 NOTE — Progress Notes (Signed)
Inpatient Diabetes Program Recommendations  AACE/ADA: New Consensus Statement on Inpatient Glycemic Control (2013)  Target Ranges:  Prepandial:   less than 140 mg/dL      Peak postprandial:   less than 180 mg/dL (1-2 hours)      Critically ill patients:  140 - 180 mg/dL   Results for Keith [name], Keith [name] (MRN [name]) as of 05/31/2014 07:37  Ref. Range 05/31/2014 00:32 05/31/2014 01:38 05/31/2014 02:55 05/31/2014 04:43 05/31/2014 05:56 05/31/2014 07:12  Glucose-Capillary Latest Range: 70-99 mg/dL 282 (H) 219 (H) 132 (H) 167 (H) 187 (H) 182 (H)    Outpatient Diabetes medications: Lantus 60 units QHS, Humalog 8 units TID with meals Current orders for Inpatient glycemic control: Novolin R IV insulin drip per GlucoStabilizer  Inpatient Diabetes Program Recommendations Insulin - Basal: At time of transition off IV insulin drip, please consider ordering Lantus 50 units Q24H. Correction (SSI): At time of transition off IV insulin drip, please consider ordering Novolog moderate correction scale ACHS. Insulin - Meal Coverage: At time of transition off IV insulin drip, please consider ordering Novolog 5 units TID with meals for meal coverage (in addition to Novolog correction scale). Diet: At time of transition off IV insulin drip, please consider ordering Carb Modified diet.  Note: NURSING: Basal SQ (Levemir/Lantus) insulin must be given 1-2 hours prior to discontinuation of insulin drip and Novolog SQ correction scale must be administered simultaneously when the drip is discontinued.  Thanks, Barnie Alderman, RN, MSN, CCRN, CDE Diabetes Coordinator Inpatient Diabetes Program [phone] (Team Pager) [phone] ([facility] office) [phone] [facility] office)

## 2014-05-31 NOTE — ED Notes (Signed)
cbg is 219

## 2014-05-31 NOTE — ED Notes (Signed)
cbg reading of 282

## 2014-05-31 NOTE — Progress Notes (Signed)
UR completed 

## 2014-06-01 DIAGNOSIS — E1100 Type 2 diabetes mellitus with hyperosmolarity without nonketotic hyperglycemic-hyperosmolar coma (NKHHC): Secondary | ICD-10-CM | POA: Diagnosis not present

## 2014-06-01 LAB — GLUCOSE, CAPILLARY
Glucose-Capillary: 334 mg/dL — ABNORMAL HIGH (ref 70–99)
Glucose-Capillary: 327 mg/dL — ABNORMAL HIGH (ref 70–99)

## 2014-06-01 LAB — BASIC METABOLIC PANEL
Anion gap: 5 (ref 5–15)
BUN: 11 mg/dL (ref 6–23)
CO2: 30 mmol/L (ref 19–32)
Calcium: 8.9 mg/dL (ref 8.4–10.5)
Chloride: 97 mmol/L (ref 96–112)
Creatinine, Ser: 0.88 mg/dL (ref 0.50–1.35)
GFR calc Af Amer: >90 mL/min (ref >90)
GFR calc non Af Amer: >90 mL/min (ref >90)
Glucose, Bld: 349 mg/dL — ABNORMAL HIGH (ref 70–99)
Potassium: 4.3 mmol/L (ref 3.5–5.1)
Sodium: 132 mmol/L — ABNORMAL LOW (ref 135–145)

## 2014-06-01 LAB — HEMOGLOBIN A1C
Hgb A1c MFr Bld: 17.9 % — ABNORMAL HIGH (ref 4.8–5.6)
Mean Plasma Glucose: 467 mg/dL

## 2014-06-01 LAB — FOLATE RBC
Folate, Hemolysate: 446.4 ng/mL
Folate, RBC: 1206 ng/mL (ref >498)
Hematocrit: 37 % — ABNORMAL LOW (ref 37.5–51.0)

## 2014-06-01 MED ORDER — INSULIN GLARGINE 100 UNIT/ML ~~LOC~~ SOLN
20.0000 [IU] | Freq: Once | SUBCUTANEOUS | Status: AC
  Administered 2014-06-01: 20 [IU] via SUBCUTANEOUS
  Filled 2014-06-01: qty 0.2

## 2014-06-01 MED ORDER — INSULIN GLARGINE 100 UNIT/ML SOLOSTAR PEN: 60.0000 [IU] | PEN_INJECTOR | Freq: Every day | SUBCUTANEOUS | Status: DC

## 2014-06-01 MED ORDER — OXYCODONE HCL 10 MG PO TABS: 10.0000 mg | ORAL_TABLET | Freq: Three times a day (TID) | ORAL | Status: DC

## 2014-06-01 NOTE — Discharge Summary (Signed)
Physician Discharge Summary  Keith [name] [id number] DOB: 1965/11/13 DOA: 05/30/2014  PCP: No primary care provider on file.  Admit date: 05/30/2014 Discharge date: 06/01/2014  Time spent: 50* minutes  Recommendations for Outpatient Follow-up:  1. *Follow up PCP in 2 weeks  Discharge Diagnoses:  Active Problems:   AIDS   Essential hypertension   Hyperlipidemia   Hyperglycemia   Discharge Condition: Stable  Diet recommendation: Carb modified diet   History of present illness:  49 y.o. male presents with hyperglycemia. He states that he came into the hospital because he injured his hand and it was bleeding. In the ED he was noted to have a glucose of over 700. He states he has not been taking his medications for his diabetes for he last week. He states that he has noted increased thiorst and urination. He denies having fevers or chills. He states that he has been taking his HIV medications. Also he has chronic pain and is seeing Dr Ardelia Mems for this. Patient stats he does not smoke or drink  Hospital Course:  1-Hyperosmolar Hyperglycemic state: Patient's blood glucose was elevated at 755 with sodium 125. Patient was started on insulin GTT, which was transition to Lantus 40 units daily. At this time blood sugar has improved. Patient will be discharged home he will continue to take Lantus 60 units daily. Will also continue with Humalog 8 units into the skin 3 times a day.  2-hyponatremia; pseudohyponatremia in setting of hyperglycemia. Resolved.  3-Hyperkalemia; corrected with insulin.  4-AIDS/HIV continue with his antiretrovirals  5-Hand wound -appears to be superficial - Patient takes oxycodone 10 mg 3 times a day. Will give him 15 tablets at the time of discharge and will follow up with his PCP for further refills.  Procedures:  None  Consultations:  None  Discharge Exam: Filed Vitals:   06/01/14 0941  BP: 120/96  Pulse: 85  Temp: 98.3 F (36.8 C)  Resp: 18     General: *Appearing no acute distress Cardiovascular: S1-S2 regular Respiratory: Clear bilaterally  Discharge Instructions   Discharge Instructions    Diet Carb Modified    Complete by:  As directed      Increase activity slowly    Complete by:  As directed           Current Discharge Medication List    CONTINUE these medications which have CHANGED   Details  Insulin Glargine (LANTUS SOLOSTAR) 100 UNIT/ML Solostar Pen Inject 60 Units into the skin daily at 10 pm. Qty: 15 mL, Refills: 3   Associated Diagnoses: Type 2 diabetes with circulatory disorder causing erectile dysfunction    Oxycodone HCl 10 MG TABS Take 1 tablet (10 mg total) by mouth 3 (three) times daily. Qty: 15 tablet, Refills: 0      CONTINUE these medications which have NOT CHANGED   Details  aspirin 81 MG EC tablet Take 81 mg by mouth daily.     dapsone 100 MG tablet TAKE 1 TABLET BY MOUTH EVERY DAY Qty: 30 tablet, Refills: 1   Associated Diagnoses: HIV disease    dolutegravir (TIVICAY) 50 MG tablet Take 1 tablet (50 mg total) by mouth daily. Qty: 30 tablet, Refills: 5    insulin lispro (HUMALOG) 100 UNIT/ML KiwkPen Inject 0.08 mLs (8 Units total) into the skin 3 (three) times daily. Qty: 15 mL, Refills: 0    NORVIR 100 MG TABS tablet TAKE 1 TABLET BY MOUTH EVERY DAY WITH BREAKFAST Qty: 30 tablet, Refills: 1  Associated Diagnoses: HIV disease    pravastatin (PRAVACHOL) 40 MG tablet Take 1 tablet (40 mg total) by mouth daily. Qty: 30 tablet, Refills: 11   Associated Diagnoses: Hyperlipidemia    PREZISTA 800 MG tablet TAKE 1 TABLET BY MOUTH EVERY DAY WITH BREAKFAST Qty: 30 tablet, Refills: 1   Associated Diagnoses: HIV disease    testosterone (ANDROGEL) 50 MG/5GM GEL Place 5 g onto the skin daily.     TRUVADA 200-300 MG per tablet TAKE 1 TABLET BY MOUTH DAILY Qty: 30 tablet, Refills: 1   Associated Diagnoses: HIV disease    doxycycline (VIBRA-TABS) 100 MG tablet Take 1 tablet (100 mg  total) by mouth 2 (two) times daily. Qty: 19 tablet, Refills: 0       Allergies  Allergen Reactions  . Sulfa Antibiotics Itching      The results of significant diagnostics from this hospitalization (including imaging, microbiology, ancillary and laboratory) are listed below for reference.    Significant Diagnostic Studies: Dg Finger Thumb Right  05/30/2014   CLINICAL DATA:  Wound on thumb  EXAM: RIGHT THUMB 2+V  COMPARISON:  None.  FINDINGS: No acute fracture. No dislocation. No destructive bone lesion. There is chronic appearing periosteum reaction along the tuft of the distal phalanx of the thumb.  IMPRESSION: No acute bony pathology.  Chronic change.   Electronically Signed   By: Marybelle Killings M.D.   On: 05/30/2014 20:50    Microbiology: Recent Results (from the past 240 hour(s))  MRSA PCR Screening     Status: None   Collection Time: 05/31/14  9:15 AM  Result Value Ref Range Status   MRSA by PCR NEGATIVE NEGATIVE Final    Comment:        The GeneXpert MRSA Assay (FDA approved for NASAL specimens only), is one component of a comprehensive MRSA colonization surveillance program. It is not intended to diagnose MRSA infection nor to guide or monitor treatment for MRSA infections.      Labs: Basic Metabolic Panel:  Recent Labs Lab 05/30/14 1434 05/31/14 0301 06/01/14 0437  NA 125* 137 132*  K 5.6* 3.8 4.3  CL 87* 100 97  CO2 27 30 30   GLUCOSE 755* 132* 349*  BUN 15 15 11   CREATININE 1.24 1.07  0.90 0.88  CALCIUM 9.7 9.5 8.9   Liver Function Tests:  Recent Labs Lab 05/30/14 1434 05/31/14 0301  AST 29 22  ALT 21 20  ALKPHOS 103 82  BILITOT 1.2 0.5  PROT 8.2 7.6  ALBUMIN 3.7 3.6   No results for input(s): LIPASE, AMYLASE in the last 168 hours. No results for input(s): AMMONIA in the last 168 hours. CBC:  Recent Labs Lab 05/30/14 1434 05/31/14 0301  WBC 6.6 4.8  5.0  HGB 13.3 12.8*  12.7*  HCT 39.2 37.8*  37.7*  MCV 77.6* 77.0*  77.1*   PLT 215 205  206   Cardiac Enzymes: No results for input(s): CKTOTAL, CKMB, CKMBINDEX, TROPONINI in the last 168 hours. BNP: BNP (last 3 results) No results for input(s): BNP in the last 8760 hours.  ProBNP (last 3 results) No results for input(s): PROBNP in the last 8760 hours.  CBG:  Recent Labs Lab 05/31/14 1222 05/31/14 1648 05/31/14 2117 06/01/14 0637 06/01/14 1100  GLUCAP 287* 280* 257* 334* 327*       Signed:  Talmage Teaster S  [facility] 06/01/2014, 12:00 PM

## 2014-06-01 NOTE — Progress Notes (Signed)
D/C orders received. Pt educated on d/c instructions and verbalized understanding. Pt handed d/c packet and prescription. IV and tele removed. Pt taken downstairs by staff. RN advised pt not to drive home but he politely refused.

## 2014-06-09 ENCOUNTER — Inpatient Hospital Stay: Payer: Medicare Other | Admitting: Internal Medicine

## 2014-06-10 IMAGING — CR DG CHEST 2V
2 series · 2 of 2 positions shown · non-contrast
Comparison: 07/19/2012

CLINICAL DATA: Cough and right-sided pain

EXAM:
CHEST  2 VIEW

[w chest pa]
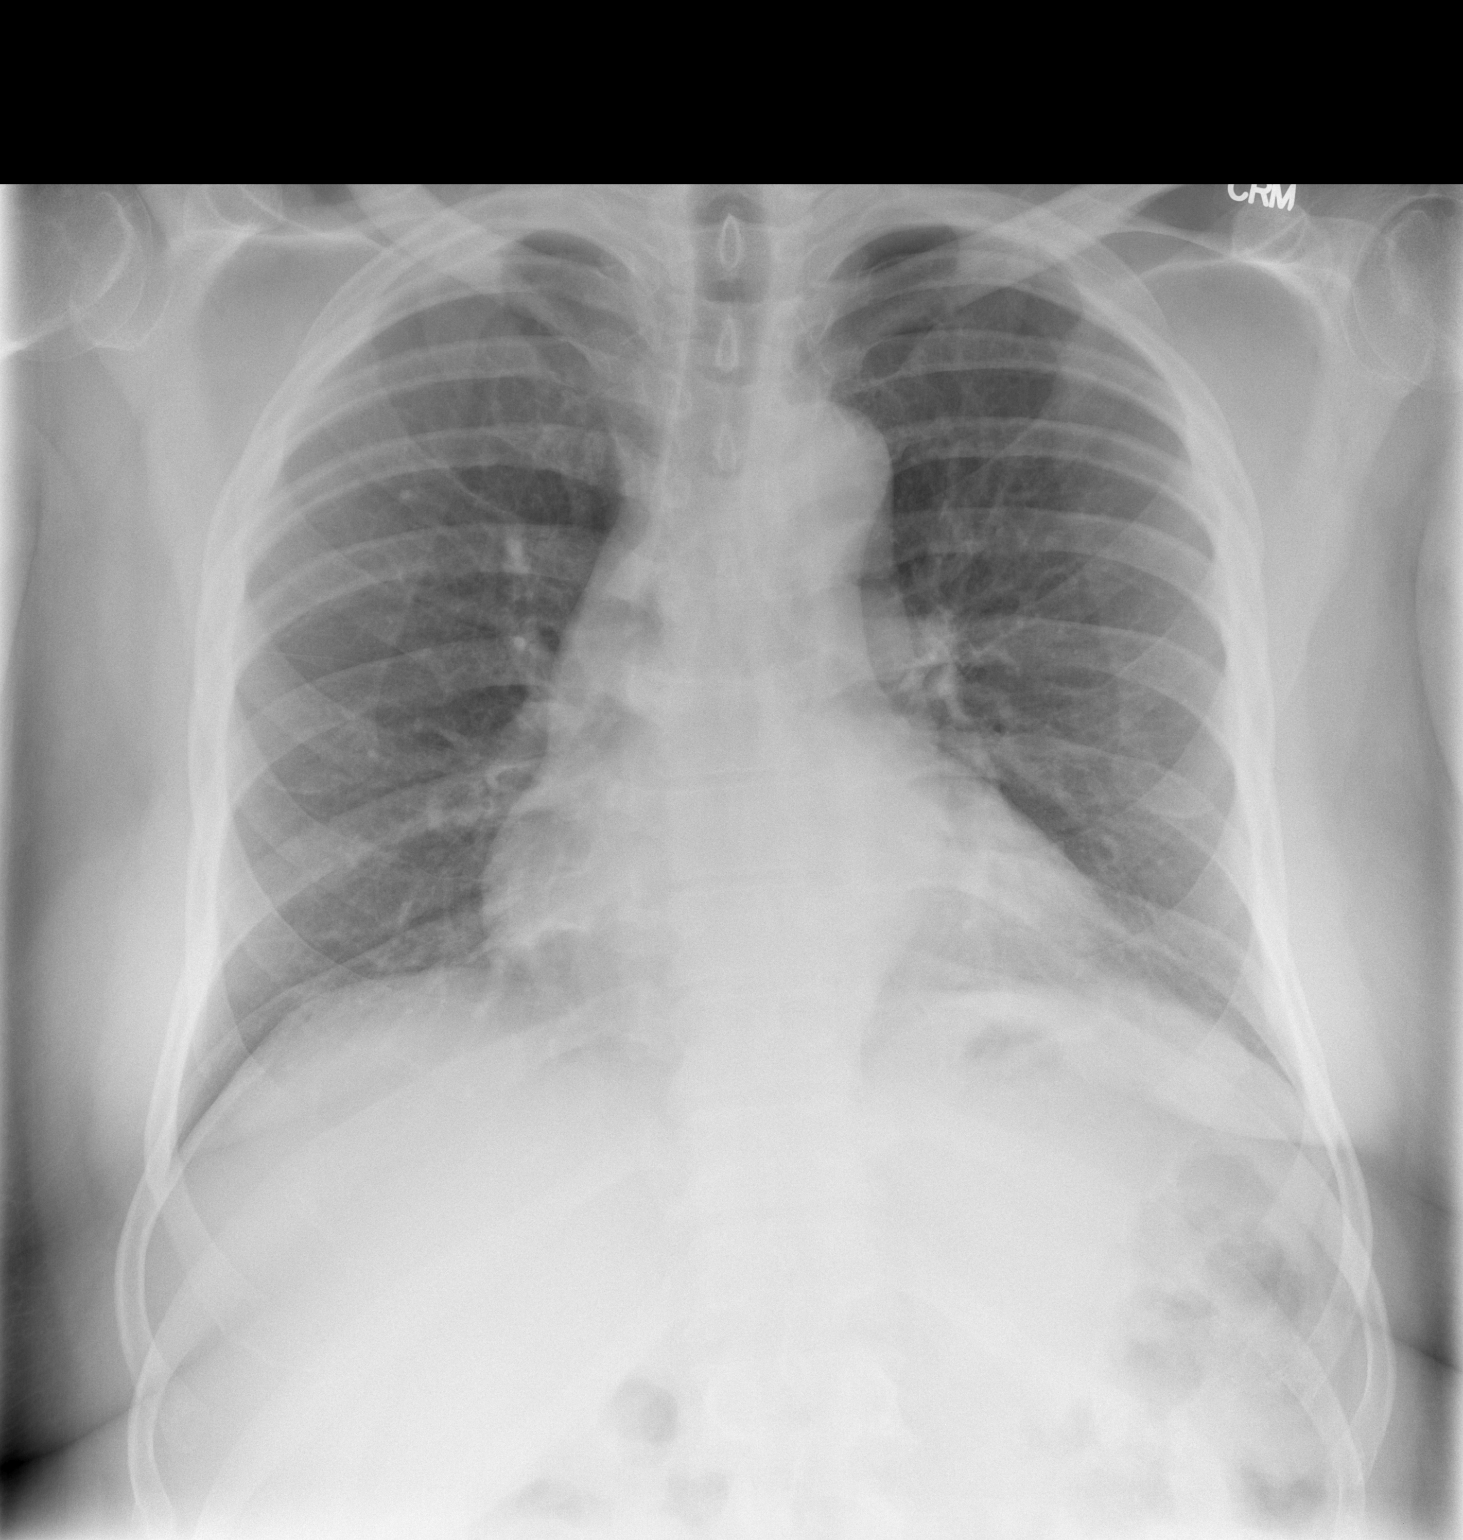

[w chest lat]
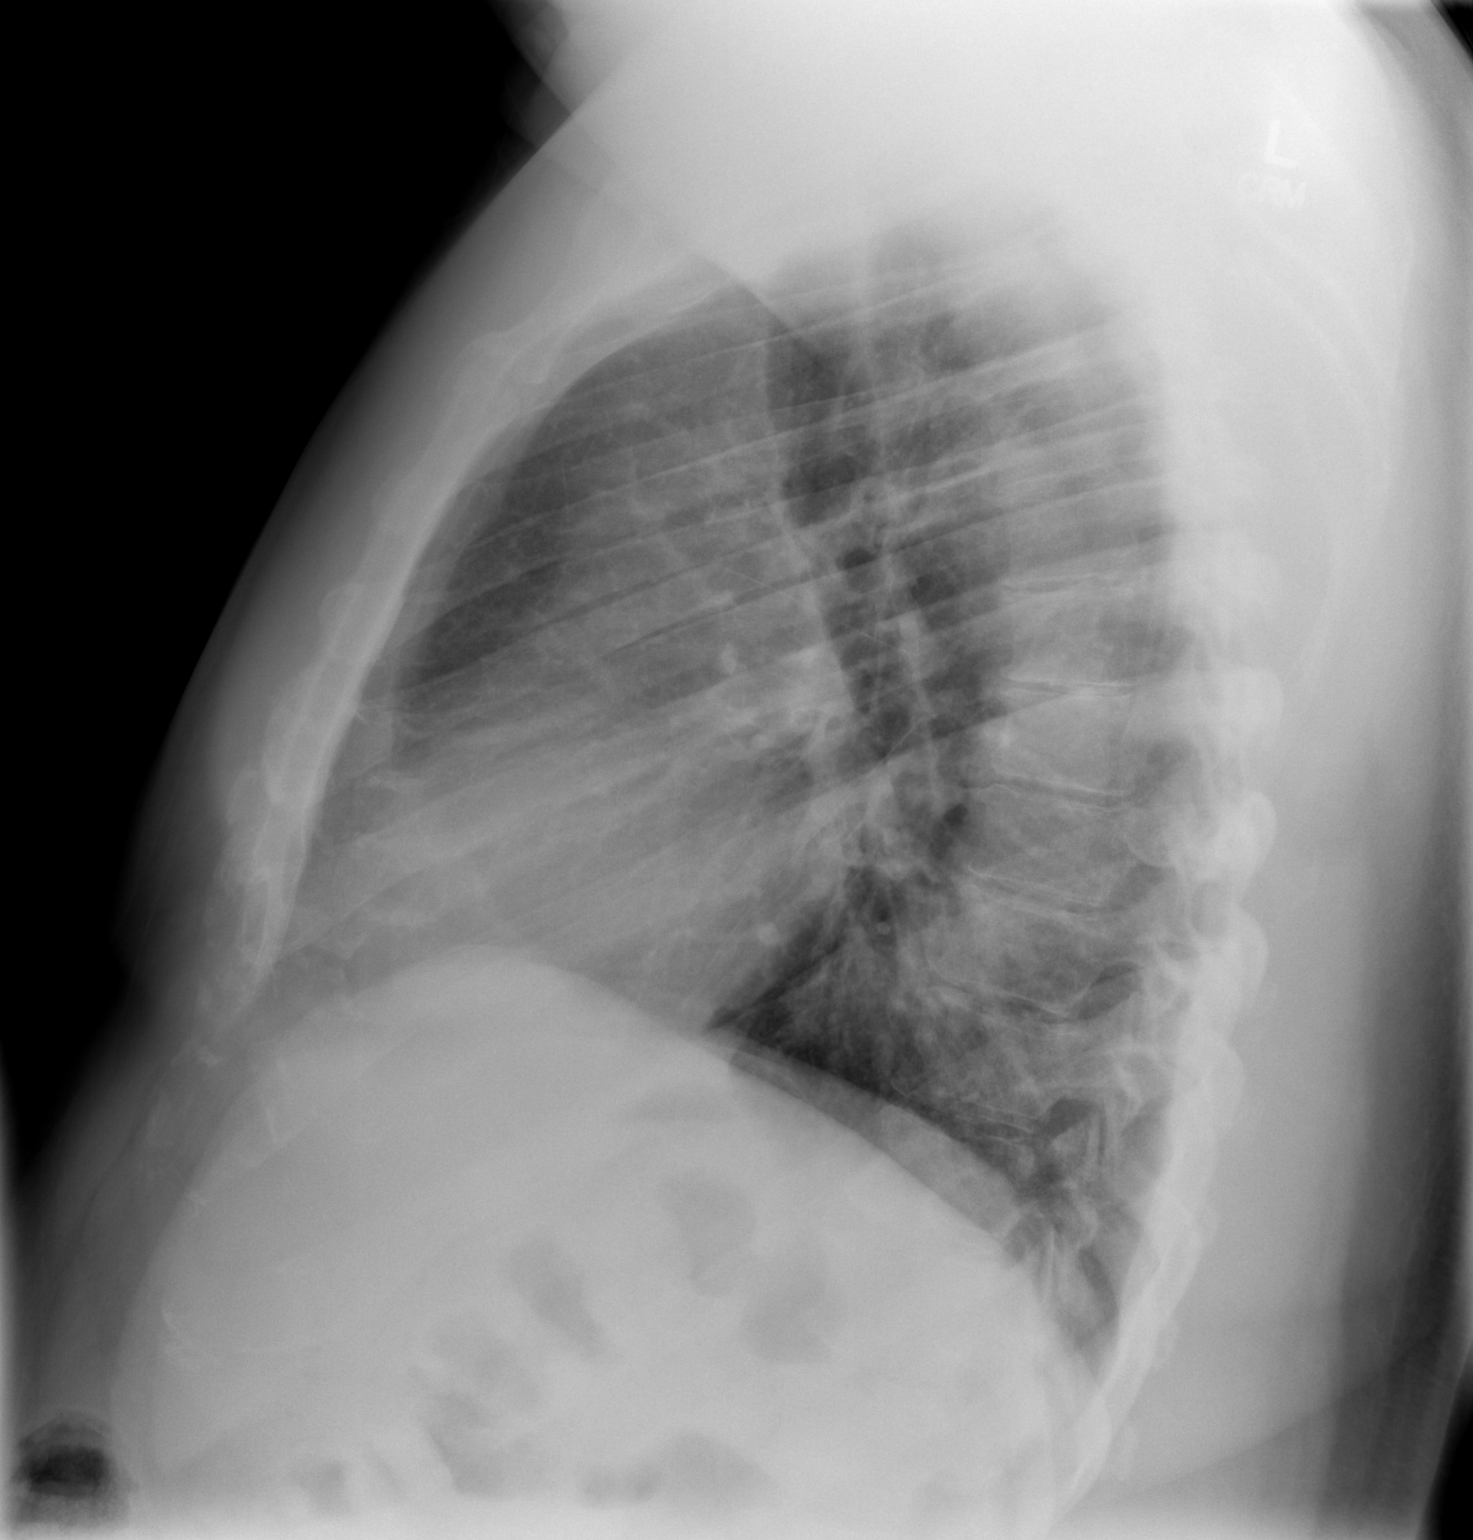

[2 of 2 positions shown; findings below may reference images not displayed]

FINDINGS: Normal heart size. Aortic tortuosity which is similar to prior. No
acute infiltrate or edema. No effusion or pneumothorax. No acute
osseous findings.
IMPRESSION: No active cardiopulmonary disease.

## 2014-06-16 ENCOUNTER — Other Ambulatory Visit: Payer: Self-pay | Admitting: *Deleted

## 2014-06-16 DIAGNOSIS — Z113 Encounter for screening for infections with a predominantly sexual mode of transmission: Secondary | ICD-10-CM

## 2014-07-06 ENCOUNTER — Ambulatory Visit (INDEPENDENT_AMBULATORY_CARE_PROVIDER_SITE_OTHER): Payer: Medicare Other | Admitting: Infectious Diseases

## 2014-07-06 ENCOUNTER — Encounter: Payer: Self-pay | Admitting: Infectious Diseases

## 2014-07-06 VITALS — BP 143/96 | HR 80 | Temp 98.2°F | Ht 69.0 in | Wt 267.0 lb

## 2014-07-06 DIAGNOSIS — B20 Human immunodeficiency virus [HIV] disease: Secondary | ICD-10-CM

## 2014-07-06 DIAGNOSIS — Z23 Encounter for immunization: Secondary | ICD-10-CM | POA: Diagnosis not present

## 2014-07-06 DIAGNOSIS — Z113 Encounter for screening for infections with a predominantly sexual mode of transmission: Secondary | ICD-10-CM | POA: Diagnosis not present

## 2014-07-06 DIAGNOSIS — N521 Erectile dysfunction due to diseases classified elsewhere: Secondary | ICD-10-CM

## 2014-07-06 DIAGNOSIS — E1159 Type 2 diabetes mellitus with other circulatory complications: Secondary | ICD-10-CM

## 2014-07-06 DIAGNOSIS — Z79899 Other long term (current) drug therapy: Secondary | ICD-10-CM | POA: Diagnosis not present

## 2014-07-06 LAB — LIPID PANEL
Cholesterol: 282 mg/dL — ABNORMAL HIGH (ref 0–200)
HDL: 41 mg/dL (ref >=40)
LDL Cholesterol: 216 mg/dL — ABNORMAL HIGH (ref 0–99)
Total CHOL/HDL Ratio: 6.9 Ratio
Triglycerides: 123 mg/dL (ref <150)
VLDL: 25 mg/dL (ref 0–40)

## 2014-07-06 NOTE — Assessment & Plan Note (Signed)
He states he is taking his art.  Will give him next hep b vax.  He is given condoms.  Will see him back in 3-4 months.

## 2014-07-06 NOTE — Addendum Note (Signed)
Addended by: Landis Gandy on: 07/06/2014 11:56 AM   Modules accepted: Orders

## 2014-07-06 NOTE — Progress Notes (Signed)
   Subjective:    [name], male    DOB: [birth year], 49 y.o.   MRN: [name]  HPI 49 yo M with HIV/AIDS, DM2 who went to hospital 2-22 for R hand bleeding after injury and persistent bleeding. He was found to have a FSG > 700 and admitted to being off his rx for > 1 week. He was felt to have a superficial hand wound (had this I &D) and was started on doxy. His insulin was restarted. He returned home on 2-24.  I have not seen him in ID clinic since at least 2015.  Prev DTGV/DRVr/TRV. Dapsone added in hospital.   HIV 1 RNA QUANT (copies/mL)  Date Value  04/19/2014 284*  02/08/2014 45*  06/02/2013 <20   CD4 T CELL ABS (/uL)  Date Value  02/08/2014 150*  02/04/2014 170*  06/02/2013 120*   Has been taking insulin regularly now. Does not have glucometer.  Has been taking art regularly now as well. Having diarrhea since home.   Review of Systems  Constitutional: Negative for appetite change and unexpected weight change.  Gastrointestinal: Positive for diarrhea. Negative for constipation.  Genitourinary: Negative for difficulty urinating.  Neurological: Negative for numbness.       Objective:   Physical Exam  Constitutional: He appears well-developed and well-nourished.  HENT:  Mouth/Throat: No oropharyngeal exudate.  Eyes: EOM are normal. Pupils are equal, round, and reactive to light.  Neck: Neck supple.  Cardiovascular: Normal rate, regular rhythm and normal heart sounds.   Pulmonary/Chest: Effort normal and breath sounds normal.  Abdominal: Soft. Bowel sounds are normal. He exhibits no distension. There is no tenderness.  Lymphadenopathy:    He has no cervical adenopathy.       Assessment & Plan:

## 2014-07-06 NOTE — Assessment & Plan Note (Signed)
He is not happy with his PCP. Will refer him Most likely needs eye exam as well.  He needs a glucometer.

## 2014-07-07 LAB — RPR

## 2014-07-07 LAB — HIV-1 RNA ULTRAQUANT REFLEX TO GENTYP+
HIV 1 RNA Quant: 5368 copies/mL — ABNORMAL HIGH (ref <20)
HIV-1 RNA Quant, Log: 3.73 {Log} — ABNORMAL HIGH (ref <1.30)

## 2014-07-07 LAB — T-HELPER CELL (CD4) - (RCID CLINIC ONLY)
CD4 % Helper T Cell: 7 % — ABNORMAL LOW (ref 33–55)
CD4 T Cell Abs: 120 /uL — ABNORMAL LOW (ref 400–2700)

## 2014-07-16 LAB — HIV-1 GENOTYPR PLUS

## 2014-08-12 ENCOUNTER — Telehealth: Payer: Self-pay | Admitting: *Deleted

## 2014-08-12 NOTE — Telephone Encounter (Signed)
Referral received for the patient to be evaluated for [place] visits. Referral was made due to pt's continues no shows for his appt and frequent ED visit. RN contacted the pt to arrange a initial visit time. No answer after several attempts by the RN and pt's voicemail is full(not accepting any further messages). RN will continue to make contact with the patient to arrange initial home visit

## 2014-08-21 ENCOUNTER — Encounter (HOSPITAL_COMMUNITY): Payer: Self-pay | Admitting: Family Medicine

## 2014-08-21 ENCOUNTER — Emergency Department (HOSPITAL_COMMUNITY): Payer: Medicare Other | Admitting: Certified Registered"

## 2014-08-21 ENCOUNTER — Ambulatory Visit (HOSPITAL_COMMUNITY)
Admission: EM | Admit: 2014-08-21 | Discharge: 2014-08-21 | Disposition: A | Discharge status: Home / Self Care | Payer: Medicare Other | Attending: Emergency Medicine | Admitting: Emergency Medicine

## 2014-08-21 ENCOUNTER — Encounter (HOSPITAL_COMMUNITY)
Admission: EM | Disposition: A | Discharge status: Home / Self Care | Payer: Self-pay | Source: Home / Self Care | Attending: Emergency Medicine

## 2014-08-21 DIAGNOSIS — Z882 Allergy status to sulfonamides status: Secondary | ICD-10-CM | POA: Insufficient documentation

## 2014-08-21 DIAGNOSIS — E119 Type 2 diabetes mellitus without complications: Secondary | ICD-10-CM | POA: Insufficient documentation

## 2014-08-21 DIAGNOSIS — I10 Essential (primary) hypertension: Secondary | ICD-10-CM | POA: Insufficient documentation

## 2014-08-21 DIAGNOSIS — B20 Human immunodeficiency virus [HIV] disease: Secondary | ICD-10-CM | POA: Insufficient documentation

## 2014-08-21 DIAGNOSIS — L03012 Cellulitis of left finger: Secondary | ICD-10-CM | POA: Insufficient documentation

## 2014-08-21 DIAGNOSIS — I739 Peripheral vascular disease, unspecified: Secondary | ICD-10-CM | POA: Diagnosis not present

## 2014-08-21 DIAGNOSIS — L02512 Cutaneous abscess of left hand: Secondary | ICD-10-CM

## 2014-08-21 HISTORY — PX: I & D EXTREMITY: SHX5045

## 2014-08-21 LAB — CBC WITH DIFFERENTIAL/PLATELET
Basophils Absolute: 0 10*3/uL (ref 0.0–0.1)
Basophils Relative: 0 % (ref 0–1)
Eosinophils Absolute: 0.1 10*3/uL (ref 0.0–0.7)
Eosinophils Relative: 1 % (ref 0–5)
HCT: 41.1 % (ref 39.0–52.0)
Hemoglobin: 13.7 g/dL (ref 13.0–17.0)
Lymphocytes Relative: 38 % (ref 12–46)
Lymphs Abs: 2.6 10*3/uL (ref 0.7–4.0)
MCH: 26.2 pg (ref 26.0–34.0)
MCHC: 33.3 g/dL (ref 30.0–36.0)
MCV: 78.6 fL (ref 78.0–100.0)
Monocytes Absolute: 0.4 10*3/uL (ref 0.1–1.0)
Monocytes Relative: 5 % (ref 3–12)
Neutro Abs: 3.9 10*3/uL (ref 1.7–7.7)
Neutrophils Relative %: 56 % (ref 43–77)
Platelets: 232 10*3/uL (ref 150–400)
RBC: 5.23 MIL/uL (ref 4.22–5.81)
RDW: 13 % (ref 11.5–15.5)
WBC: 6.9 10*3/uL (ref 4.0–10.5)

## 2014-08-21 LAB — BASIC METABOLIC PANEL
Anion gap: 10 (ref 5–15)
BUN: 13 mg/dL (ref 6–20)
CO2: 26 mmol/L (ref 22–32)
Calcium: 9.6 mg/dL (ref 8.9–10.3)
Chloride: 97 mmol/L — ABNORMAL LOW (ref 101–111)
Creatinine, Ser: 0.76 mg/dL (ref 0.61–1.24)
GFR calc Af Amer: >60 mL/min (ref >60)
GFR calc non Af Amer: >60 mL/min (ref >60)
Glucose, Bld: 363 mg/dL — ABNORMAL HIGH (ref 65–99)
Potassium: 4.3 mmol/L (ref 3.5–5.1)
Sodium: 133 mmol/L — ABNORMAL LOW (ref 135–145)

## 2014-08-21 LAB — GLUCOSE, CAPILLARY: Glucose-Capillary: 302 mg/dL — ABNORMAL HIGH (ref 65–99)

## 2014-08-21 LAB — CBG MONITORING, ED: Glucose-Capillary: 352 mg/dL — ABNORMAL HIGH (ref 65–99)

## 2014-08-21 SURGERY — IRRIGATION AND DEBRIDEMENT EXTREMITY
Anesthesia: General | Laterality: Left

## 2014-08-21 MED ORDER — VANCOMYCIN HCL IN DEXTROSE 1-5 GM/200ML-% IV SOLN
INTRAVENOUS | Status: AC
  Administered 2014-08-21: 1000 mg via INTRAVENOUS
  Filled 2014-08-21: qty 200

## 2014-08-21 MED ORDER — PROPOFOL 10 MG/ML IV BOLUS
INTRAVENOUS | Status: AC
  Filled 2014-08-21: qty 20

## 2014-08-21 MED ORDER — MIDAZOLAM HCL 2 MG/2ML IJ SOLN
INTRAMUSCULAR | Status: AC
  Filled 2014-08-21: qty 2

## 2014-08-21 MED ORDER — ONDANSETRON HCL 4 MG/2ML IJ SOLN
INTRAMUSCULAR | Status: AC
  Filled 2014-08-21: qty 10

## 2014-08-21 MED ORDER — LIDOCAINE HCL (CARDIAC) 20 MG/ML IV SOLN
INTRAVENOUS | Status: AC
  Filled 2014-08-21: qty 5

## 2014-08-21 MED ORDER — BUPIVACAINE HCL (PF) 0.25 % IJ SOLN
INTRAMUSCULAR | Status: DC | PRN
  Administered 2014-08-21: 10 mL

## 2014-08-21 MED ORDER — LACTATED RINGERS IV SOLN
INTRAVENOUS | Status: DC | PRN
  Administered 2014-08-21: 14:00:00 via INTRAVENOUS

## 2014-08-21 MED ORDER — OXYCODONE-ACETAMINOPHEN 5-325 MG PO TABS: ORAL_TABLET | ORAL | Status: DC

## 2014-08-21 MED ORDER — FENTANYL CITRATE (PF) 100 MCG/2ML IJ SOLN
INTRAMUSCULAR | Status: DC | PRN
  Administered 2014-08-21: 50 ug via INTRAVENOUS

## 2014-08-21 MED ORDER — DOXYCYCLINE HYCLATE 50 MG PO CAPS: 100.0000 mg | ORAL_CAPSULE | Freq: Two times a day (BID) | ORAL | Status: DC

## 2014-08-21 MED ORDER — ONDANSETRON HCL 4 MG/2ML IJ SOLN
INTRAMUSCULAR | Status: DC | PRN
  Administered 2014-08-21: 4 mg via INTRAVENOUS

## 2014-08-21 MED ORDER — BUPIVACAINE HCL (PF) 0.25 % IJ SOLN
INTRAMUSCULAR | Status: AC
  Filled 2014-08-21: qty 30

## 2014-08-21 MED ORDER — SODIUM CHLORIDE 0.9 % IR SOLN
Status: DC | PRN
  Administered 2014-08-21: 1000 mL

## 2014-08-21 MED ORDER — MIDAZOLAM HCL 5 MG/5ML IJ SOLN
INTRAMUSCULAR | Status: DC | PRN
  Administered 2014-08-21: 2 mg via INTRAVENOUS

## 2014-08-21 MED ORDER — FENTANYL CITRATE (PF) 250 MCG/5ML IJ SOLN
INTRAMUSCULAR | Status: AC
  Filled 2014-08-21: qty 5

## 2014-08-21 MED ORDER — LIDOCAINE HCL (CARDIAC) 20 MG/ML IV SOLN
INTRAVENOUS | Status: DC | PRN
  Administered 2014-08-21: 60 mg via INTRAVENOUS

## 2014-08-21 MED ORDER — PROPOFOL 10 MG/ML IV BOLUS
INTRAVENOUS | Status: DC | PRN
  Administered 2014-08-21: 200 mg via INTRAVENOUS

## 2014-08-21 SURGICAL SUPPLY — 53 items
BANDAGE COBAN STERILE 2 (GAUZE/BANDAGES/DRESSINGS) IMPLANT
BANDAGE ELASTIC 3 VELCRO ST LF (GAUZE/BANDAGES/DRESSINGS) ×2 IMPLANT
BANDAGE ELASTIC 4 VELCRO ST LF (GAUZE/BANDAGES/DRESSINGS) ×2 IMPLANT
BNDG CMPR 9X4 STRL LF SNTH (GAUZE/BANDAGES/DRESSINGS)
BNDG COHESIVE 1X5 TAN STRL LF (GAUZE/BANDAGES/DRESSINGS) ×1 IMPLANT
BNDG CONFORM 2 STRL LF (GAUZE/BANDAGES/DRESSINGS) IMPLANT
BNDG ESMARK 4X9 LF (GAUZE/BANDAGES/DRESSINGS) IMPLANT
BNDG GAUZE ELAST 4 BULKY (GAUZE/BANDAGES/DRESSINGS) ×2 IMPLANT
CORDS BIPOLAR (ELECTRODE) ×2 IMPLANT
COVER SURGICAL LIGHT HANDLE (MISCELLANEOUS) ×2 IMPLANT
DECANTER SPIKE VIAL GLASS SM (MISCELLANEOUS) ×2 IMPLANT
DRAIN PENROSE 1/4X12 LTX STRL (WOUND CARE) IMPLANT
DRSG ADAPTIC 3X8 NADH LF (GAUZE/BANDAGES/DRESSINGS) IMPLANT
DRSG EMULSION OIL 3X3 NADH (GAUZE/BANDAGES/DRESSINGS) ×2 IMPLANT
DRSG PAD ABDOMINAL 8X10 ST (GAUZE/BANDAGES/DRESSINGS) ×4 IMPLANT
GAUZE PACKING IODOFORM 2 (PACKING) ×1 IMPLANT
GAUZE SPONGE 4X4 12PLY STRL (GAUZE/BANDAGES/DRESSINGS) ×2 IMPLANT
GAUZE XEROFORM 1X8 LF (GAUZE/BANDAGES/DRESSINGS) ×2 IMPLANT
GLOVE BIO SURGEON STRL SZ7.5 (GLOVE) ×2 IMPLANT
GLOVE BIOGEL PI IND STRL 8 (GLOVE) ×1 IMPLANT
GLOVE BIOGEL PI INDICATOR 8 (GLOVE) ×1
GOWN STRL REUS W/ TWL LRG LVL3 (GOWN DISPOSABLE) ×1 IMPLANT
GOWN STRL REUS W/TWL LRG LVL3 (GOWN DISPOSABLE) ×2
KIT BASIN OR (CUSTOM PROCEDURE TRAY) ×2 IMPLANT
KIT ROOM TURNOVER OR (KITS) ×2 IMPLANT
LOOP VESSEL MAXI BLUE (MISCELLANEOUS) IMPLANT
LOOP VESSEL MINI RED (MISCELLANEOUS) IMPLANT
MANIFOLD NEPTUNE II (INSTRUMENTS) ×2 IMPLANT
NDL HYPO 25X1 1.5 SAFETY (NEEDLE) IMPLANT
NEEDLE HYPO 25X1 1.5 SAFETY (NEEDLE) IMPLANT
NS IRRIG 1000ML POUR BTL (IV SOLUTION) ×2 IMPLANT
PACK ORTHO EXTREMITY (CUSTOM PROCEDURE TRAY) ×2 IMPLANT
PAD ARMBOARD 7.5X6 YLW CONV (MISCELLANEOUS) ×4 IMPLANT
SCRUB BETADINE 4OZ XXX (MISCELLANEOUS) ×2 IMPLANT
SET CYSTO W/LG BORE CLAMP LF (SET/KITS/TRAYS/PACK) ×2 IMPLANT
SOLUTION BETADINE 4OZ (MISCELLANEOUS) ×2 IMPLANT
SPLINT FINGER (SOFTGOODS) ×1 IMPLANT
SPLINT FINGER W/BULB (SOFTGOODS) ×1 IMPLANT
SPONGE GAUZE 4X4 12PLY STER LF (GAUZE/BANDAGES/DRESSINGS) ×1 IMPLANT
SPONGE LAP 18X18 X RAY DECT (DISPOSABLE) ×2 IMPLANT
SPONGE LAP 4X18 X RAY DECT (DISPOSABLE) ×2 IMPLANT
SUCTION FRAZIER TIP 10 FR DISP (SUCTIONS) ×2 IMPLANT
SUT ETHILON 4 0 PS 2 18 (SUTURE) ×2 IMPLANT
SUT MON AB 5-0 P3 18 (SUTURE) IMPLANT
SYR CONTROL 10ML LL (SYRINGE) IMPLANT
TOWEL OR 17X24 6PK STRL BLUE (TOWEL DISPOSABLE) ×2 IMPLANT
TOWEL OR 17X26 10 PK STRL BLUE (TOWEL DISPOSABLE) ×2 IMPLANT
TUBE ANAEROBIC SPECIMEN COL (MISCELLANEOUS) IMPLANT
TUBE CONNECTING 12X1/4 (SUCTIONS) ×2 IMPLANT
TUBE FEEDING 5FR 15 INCH (TUBING) IMPLANT
UNDERPAD 30X30 INCONTINENT (UNDERPADS AND DIAPERS) ×2 IMPLANT
WATER STERILE IRR 1000ML POUR (IV SOLUTION) ×2 IMPLANT
YANKAUER SUCT BULB TIP NO VENT (SUCTIONS) ×2 IMPLANT

## 2014-08-21 NOTE — H&P (Signed)
  Keith [name] is an 49 y.o. male.   Chief Complaint: left small finger infection HPI: 49 yo male with pain and swelling left small finger x 2 days.  States it started when he pulled an ingrown nail.  Has progressively worsened.  No fevers, chills, night sweats.  Past Medical History  Diagnosis Date  . HIV (human immunodeficiency virus infection) 2009    CD4 count 100, VL 13800 (05/01/2010)  . Diabetes type 2, uncontrolled     HgA1c 17.6 (04/27/2010)  . Hypertension   . Genital warts   . Erectile dysfunction   . Chronic knee pain     right  . Osteomyelitis     h/o hand  . Diabetes mellitus   . Chronic pain   . AIDS     Past Surgical History  Procedure Laterality Date  . Multiple extractions with alveoloplasty N/A 01/18/2013    Procedure: MULTIPLE EXTRACION 3, 6, 7, 10, 11, 13, 21, 22, 27, 28, 29, 30 WITH ALVEOLOPLASTY;  Surgeon: Gae Bon, DDS;  Location: [facility];  Service: Oral Surgery;  Laterality: N/A;  . Hernia repair    . Minor irrigation and debridement of wound Right 04/22/2014    Procedure: IRRIGATION AND DEBRIDEMENT OF RIGHT NECK ABCESS;  Surgeon: Jerrell Belfast, MD;  Location: [facility] OR;  Service: ENT;  Laterality: Right;    Family History  Problem Relation Age of Onset  . Hypertension Mother   . Arthritis Father   . Hypertension Father   . Hypertension Brother   . Cancer Maternal Grandmother 75    unknown type of cancer  . Depression Paternal Grandmother    Social History:  reports that he has never smoked. He has never used smokeless tobacco. He reports that he does not drink alcohol or use illicit drugs.  Allergies:  Allergies  Allergen Reactions  . Sulfa Antibiotics Itching     (Not in a hospital admission)  Results for orders placed or performed during the hospital encounter of 08/21/14 (from the past 48 hour(s))  POC CBG, ED     Status: Abnormal   Collection Time: 08/21/14 11:36 AM  Result Value Ref Range   Glucose-Capillary 352 (H) 65 - 99  mg/dL    No results found.   A comprehensive review of systems was negative.  Blood pressure 135/90, pulse 76, resp. rate 18, SpO2 95 %.  General appearance: alert, cooperative and appears stated age Head: Normocephalic, without obvious abnormality, atraumatic Neck: supple, symmetrical, trachea midline Resp: clear to auscultation bilaterally Cardio: regular rate and rhythm GI: non tender Extremities: intact sensation and capillary refill all digits.  +epl/fpl/io.  left small swollen distal phalanx.  ttp.  no drainage.  no proximal streaking.  no proximal tenderness.  pad and paronychial tissues swollen and tender. Pulses: 2+ and symmetric Skin: Skin color, texture, turgor normal. No rashes or lesions Neurologic: Grossly normal Incision/Wound: none  Assessment/Plan Left small finger felon and paronychia.  Recommend OR for incision and drainage.  Risks, benefits, and alternatives of surgery were discussed and the patient agrees with the plan of care.   Burnice Vassel R 08/21/2014, 12:53 PM

## 2014-08-21 NOTE — Anesthesia Postprocedure Evaluation (Signed)
Anesthesia Post Note  Patient: Keith [name]  Procedure(s) Performed: Procedure(s) (LRB): INCISION AND DRAINAGE LEFT SMALL FINGER (Left)  Anesthesia type: General  Patient location: PACU  Post pain: Pain level controlled  Post assessment: Post-op Vital signs reviewed  Last Vitals: BP 130/86 mmHg  Pulse 86  Temp(Src) 36.7 C  Resp 16  SpO2 99%  Post vital signs: Reviewed  Level of consciousness: sedated  Complications: No apparent anesthesia complications \

## 2014-08-21 NOTE — Op Note (Signed)
[id number] 

## 2014-08-21 NOTE — Anesthesia Preprocedure Evaluation (Signed)
Anesthesia Evaluation  Patient identified by MRN, date of birth, ID band Patient awake    Reviewed: Allergy & Precautions, NPO status , Patient's Chart, lab work & pertinent test results  Airway Mallampati: II  TM Distance: >3 FB Neck ROM: Full    Dental no notable dental hx.    Pulmonary  breath sounds clear to auscultation  Pulmonary exam normal       Cardiovascular hypertension, + Peripheral Vascular Disease Normal cardiovascular examRhythm:Regular Rate:Normal     Neuro/Psych  Neuromuscular disease    GI/Hepatic   Endo/Other  diabetes, Type 2  Renal/GU      Musculoskeletal   Abdominal   Peds  Hematology   Anesthesia Other Findings HIV-AIDS  Reproductive/Obstetrics                             Anesthesia Physical  Anesthesia Plan  ASA: III  Anesthesia Plan: General   Post-op Pain Management:    Induction: Intravenous  Airway Management Planned: Oral ETT  Additional Equipment: None  Intra-op Plan:   Post-operative Plan: Extubation in OR  Informed Consent: I have reviewed the patients History and Physical, chart, labs and discussed the procedure including the risks, benefits and alternatives for the proposed anesthesia with the patient or authorized representative who has indicated his/her understanding and acceptance.   Dental advisory given  Plan Discussed with: CRNA  Anesthesia Plan Comments:         Anesthesia Quick Evaluation

## 2014-08-21 NOTE — Transfer of Care (Signed)
Immediate Anesthesia Transfer of Care Note  Patient: Keith [name]  Procedure(s) Performed: Procedure(s): INCISION AND DRAINAGE LEFT SMALL FINGER (Left)  Patient Location: PACU  Anesthesia Type:General  Level of Consciousness: awake, alert , oriented and patient cooperative  Airway & Oxygen Therapy: Patient Spontanous Breathing and Patient connected to nasal cannula oxygen  Post-op Assessment: Report given to RN, Post -op Vital signs reviewed and stable and Patient moving all extremities  Post vital signs: Reviewed and stable  Last Vitals:  Filed Vitals:   08/21/14 1230  BP: 135/90  Pulse: 76  Resp: 18    Complications: No apparent anesthesia complications

## 2014-08-21 NOTE — Anesthesia Procedure Notes (Signed)
Procedure Name: LMA Insertion Date/Time: 08/21/2014 1:57 PM Performed by: Julian Reil Pre-anesthesia Checklist: Patient identified, Emergency Drugs available, Suction available and Patient being monitored Patient Re-evaluated:Patient Re-evaluated prior to inductionOxygen Delivery Method: Circle system utilized Preoxygenation: Pre-oxygenation with 100% oxygen Intubation Type: IV induction LMA: LMA inserted LMA Size: 5.0 Tube type: Oral Number of attempts: 1 Placement Confirmation: positive ETCO2 and breath sounds checked- equal and bilateral Tube secured with: Tape Dental Injury: Teeth and Oropharynx as per pre-operative assessment

## 2014-08-21 NOTE — Brief Op Note (Signed)
08/21/2014  2:28 PM  PATIENT:  [name]  49 y.o. male  PRE-OPERATIVE DIAGNOSIS:  left small finger infection  POST-OPERATIVE DIAGNOSIS:  same  PROCEDURE:  Procedure(s): INCISION AND DRAINAGE LEFT SMALL FINGER (Left)  SURGEON:  Surgeon(s) and Role:    * Leanora Cover, MD - Primary  PHYSICIAN ASSISTANT:   ASSISTANTS: none   ANESTHESIA:   general  EBL:     BLOOD ADMINISTERED:none  DRAINS: iodoform packing   LOCAL MEDICATIONS USED:  MARCAINE     SPECIMEN:  Source of Specimen:  left small finger  DISPOSITION OF SPECIMEN:  micro  COUNTS:  YES  TOURNIQUET:   Total Tourniquet Time Documented: Upper Arm (Left) - 18 minutes Total: Upper Arm (Left) - 18 minutes   DICTATION: .Other Dictation: Dictation Number [id number]  PLAN OF CARE: Discharge to home after PACU  PATIENT DISPOSITION:  PACU - hemodynamically stable.

## 2014-08-21 NOTE — Op Note (Signed)
Keith [name], Keith [name] NO.:  [id number]  MEDICAL RECORD NO.:  [id number]  LOCATION:  [facility]                         FACILITY:  [facility]  PHYSICIAN:  Leanora Cover, MD        DATE OF BIRTH:  03/04/1966  DATE OF PROCEDURE:  08/21/2014 DATE OF DISCHARGE:  08/21/2014                              OPERATIVE REPORT   PREOPERATIVE DIAGNOSIS:  Left small finger felon and paronychia.  POSTOPERATIVE DIAGNOSIS:  Left small finger felon and paronychia.  PROCEDURE:  Incision and drainage of left small finger felon and paronychia.  SURGEON:  Leanora Cover, MD  ASSISTANT:  None.  ANESTHESIA:  General.  IV FLUIDS:  Per anesthesia flow sheet.  ESTIMATED BLOOD LOSS:  Minimal.  COMPLICATIONS:  None.  SPECIMENS:  Cultures to micro.  TOURNIQUET TIME:  18 minutes.  DISPOSITION:  Stable to PACU.  INDICATIONS:  Keith [name] is a 49 year old male, who states approximately 2 days ago, he pulled out an ingrown nail on his left small finger.  This has increasingly become more swollen and painful.  He presented to the emergency department today, and I was consulted for management of the condition.  I recommended incision and drainage of the left small finger in the operating room.  Risks, benefits, and alternatives of surgery were discussed including risk of blood loss; infection; damage to nerves, vessels, tendons, ligaments, bone; failure of surgery; need for additional surgery; complications with wound healing; continued pain; continued infection; need for repeat irrigation and debridement.  He voiced understanding of risks and elected to proceed.  OPERATIVE COURSE:  After being identified preoperatively by myself, the patient and I agreed upon procedure and site of procedure.  Surgical site was marked.  The risks, benefits, and alternatives of surgery were reviewed, and he wished to proceed.  Surgical consent had been signed. He was given IV antibiotics preoperatively for cultures.   He was transported to the operating room and placed on the operating room table in supine position with left upper extremity on arm board.  General anesthesia was induced by anesthesiologist.  The left upper extremity was prepped and draped in normal sterile orthopedic fashion.  A surgical pause was performed between surgeons, anesthesia, and operating room staff, and all were in agreement as to the patient, procedure, and site of procedure.  Tourniquet at the proximal aspect of the extremity was inflated to 250 mmHg after exsanguination of the limb with Esmarch bandage.  A Freer elevator was used to elevate the left small finger nail.  There was gross purulence underneath.  Cultures were taken for aerobes, anaerobes, and Gram stain.  The nail was removed.  The abscess track was coursing down toward the pad of the finger on the ulnar side. Incision was made on the ulnar side of the finger and the septate of the pad spread.  There was no gross purulence within the pad.  An incision was made under the dorsal nail fold.  Again, no gross purulence.  The wounds were copiously irrigated with sterile saline.  They were all packed with quarter-inch iodoform gauze.  A piece of Xeroform was placed in the nail fold and the nail covered  with Xeroform.  The wounds were then dressed with sterile 4x4s and wrapped with a Coban dressing lightly.  A digital block had been performed with 10 mL of 0.25% plain Marcaine to aid in postoperative analgesia.  The tourniquet was deflated 18 minutes.  Fingertips were pink with brisk capillary refill after deflation of tourniquet.  Operative drapes were broken down.  The patient was awoken from anesthesia safely.  He was transferred back to stretcher and taken to the PACU in stable condition.  I will see him back in the office in 2-3 days for postoperative followup and wound care.  I will give him Percocet 5/325, 1-2 p.o. q.6 hours p.r.n. pain, dispensed #40 and  doxycycline 100 mg p.o. b.i.d. x10 days.     Leanora Cover, MD     KK/MEDQ  D:  08/21/2014  T:  08/21/2014  Job:  [id number]

## 2014-08-21 NOTE — ED Notes (Signed)
Pt sts that he pulled an ingrown nail and now swollen and possibly infected.

## 2014-08-21 NOTE — ED Provider Notes (Signed)
CSN: [id number]     Arrival date & time 08/21/14  1049 History   First MD Initiated Contact with Patient 08/21/14 1056     Chief Complaint  Patient presents with  . Finger Injury    HPI    49 year old male with a history of diabetes presents today with swelling of his left fifth distal finger. Patient reports that 3 days ago he had an ingrown nail that he pulled causing paronychia. Patient reports swelling pain and redness is continued to progress until today. Significant past medical history of skin infections with the last one earlier this year requiring oral antibiotics. MRSA -2 months ago +6 months ago. Patient has a history of HIV with his last T cells at 120 on 07/06/2014. Today patient reports pain to the finger, denies nausea vomiting abdominal pain, fever, headaches, weakness. He denies any other acute infections, reports that he is taking his medications as prescribed with CBG readings in the 200s. Patient has used oxycodone for the pain, reports continued mild pain to the extremity. Patient has no additional concerns in addition to those noted above.  Past Medical History  Diagnosis Date  . HIV (human immunodeficiency virus infection) 2009    CD4 count 100, VL 13800 (05/01/2010)  . Diabetes type 2, uncontrolled     HgA1c 17.6 (04/27/2010)  . Hypertension   . Genital warts   . Erectile dysfunction   . Chronic knee pain     right  . Osteomyelitis     h/o hand  . Diabetes mellitus   . Chronic pain   . AIDS    Past Surgical History  Procedure Laterality Date  . Multiple extractions with alveoloplasty N/A 01/18/2013    Procedure: MULTIPLE EXTRACION 3, 6, 7, 10, 11, 13, 21, 22, 27, 28, 29, 30 WITH ALVEOLOPLASTY;  Surgeon: Gae Bon, DDS;  Location: [place];  Service: Oral Surgery;  Laterality: N/A;  . Hernia repair    . Minor irrigation and debridement of wound Right 04/22/2014    Procedure: IRRIGATION AND DEBRIDEMENT OF RIGHT NECK ABCESS;  Surgeon: Jerrell Belfast, MD;   Location: [facility] OR;  Service: ENT;  Laterality: Right;   Family History  Problem Relation Age of Onset  . Hypertension Mother   . Arthritis Father   . Hypertension Father   . Hypertension Brother   . Cancer Maternal Grandmother 55    unknown type of cancer  . Depression Paternal Grandmother    History  Substance Use Topics  . Smoking status: Never Smoker   . Smokeless tobacco: Never Used  . Alcohol Use: No    Review of Systems  All other systems reviewed and are negative.  Allergies  Sulfa antibiotics  Home Medications   Prior to Admission medications   Medication Sig Start Date End Date Taking? Authorizing Provider  aspirin 81 MG EC tablet Take 81 mg by mouth daily.  08/20/11   [name], MD  B-D ULTRAFINE III SHORT PEN 31G X 8 MM MISC  06/01/14   Historical Provider, MD  dapsone 100 MG tablet TAKE 1 TABLET BY MOUTH EVERY DAY 03/28/14   [name], MD  dolutegravir (TIVICAY) 50 MG tablet Take 1 tablet (50 mg total) by mouth daily. 04/24/14   Corky Sox, MD  Insulin Glargine (LANTUS SOLOSTAR) 100 UNIT/ML Solostar Pen Inject 60 Units into the skin daily at 10 pm. 06/01/14   [name], MD  insulin lispro (HUMALOG) 100 UNIT/ML KiwkPen Inject 0.08 mLs (8  Units total) into the skin 3 (three) times daily. 02/21/14   [name], MD  LORazepam (ATIVAN) 1 MG tablet  06/19/14   Historical Provider, MD  NORVIR 100 MG TABS tablet TAKE 1 TABLET BY MOUTH EVERY DAY WITH BREAKFAST 03/28/14   [name], MD  Oxycodone HCl 10 MG TABS Take 1 tablet (10 mg total) by mouth 3 (three) times daily. Patient not taking: Reported on 07/06/2014 06/01/14   [name], MD  pravastatin (PRAVACHOL) 40 MG tablet Take 1 tablet (40 mg total) by mouth daily. 01/03/14   [name], MD  PREZISTA 800 MG tablet TAKE 1 TABLET BY MOUTH EVERY DAY WITH BREAKFAST 03/28/14   [name], MD  testosterone (ANDROGEL) 50 MG/5GM GEL Place 5 g onto the skin daily.     Historical Provider, MD    TRUVADA 200-300 MG per tablet TAKE 1 TABLET BY MOUTH DAILY 03/28/14   [name], MD   BP 141/94 mmHg  Pulse 90  Resp 18  SpO2 98% Physical Exam  Constitutional: He is oriented to person, place, and time. He appears well-developed and well-nourished.  HENT:  Head: Normocephalic and atraumatic.  Eyes: Pupils are equal, round, and reactive to light.  Neck: Normal range of motion. Neck supple. No JVD present. No tracheal deviation present. No thyromegaly present.  Cardiovascular: Regular rhythm, normal heart sounds and intact distal pulses.  Exam reveals no gallop and no friction rub.   No murmur heard. Pulmonary/Chest: Effort normal and breath sounds normal. No stridor. No respiratory distress. He has no wheezes. He has no rales. He exhibits no tenderness.  Abdominal: Soft. There is no tenderness.  Musculoskeletal: Normal range of motion.  See attached photos, ttp of infected finger, ROM intact to fingers hand and wrist, no signs of spreading infection  Lymphadenopathy:    He has no cervical adenopathy.  Neurological: He is alert and oriented to person, place, and time. Coordination normal.  Skin: Skin is warm and dry.  Psychiatric: He has a normal mood and affect. His behavior is normal. Judgment and thought content normal.  Nursing note and vitals reviewed.            ED Course  Procedures (including critical care time) Labs Review Labs Reviewed - No data to display  Imaging Review No results found.   EKG Interpretation None      MDM   Final diagnoses:  Felon, left    Labs: Glucose 302  Imaging: None indicated  Consults: Hand surgery [place]  Therapeutics: None  Assessment: Felon  Plan: Patient presents with felon left hand second digit. CABG was 302, no signs of systemic infection. Dr. Fredna Dow was consult who agreed to take the patient to the OR for I and D. Patient had no additional concerns at today's exam, and was taken to the OR without  complication.       Okey Regal, PA-C 08/24/14 [place], DO 08/24/14 [phone]

## 2014-08-21 NOTE — Discharge Instructions (Signed)

## 2014-08-22 ENCOUNTER — Encounter (HOSPITAL_COMMUNITY): Payer: Self-pay | Admitting: Orthopedic Surgery

## 2014-08-25 LAB — WOUND CULTURE: Gram Stain: NONE SEEN

## 2014-08-26 LAB — ANAEROBIC CULTURE: Gram Stain: NONE SEEN

## 2014-09-17 ENCOUNTER — Other Ambulatory Visit: Payer: Self-pay | Admitting: Infectious Diseases

## 2014-09-21 ENCOUNTER — Telehealth: Payer: Self-pay | Admitting: *Deleted

## 2014-09-21 NOTE — Telephone Encounter (Signed)
Received message that patient wanted to speak with me, no other information.  RN returned call, left message on his voicemail that he can call back. Landis Gandy, RN

## 2014-10-03 ENCOUNTER — Other Ambulatory Visit: Payer: Self-pay

## 2014-10-06 IMAGING — CR DG CHEST 1V PORT
1 series · 1 of 1 positions shown · non-contrast
Comparison: 07/14/2013.

CLINICAL DATA: Diabetes.

EXAM:
PORTABLE CHEST - 1 VIEW

[AP]
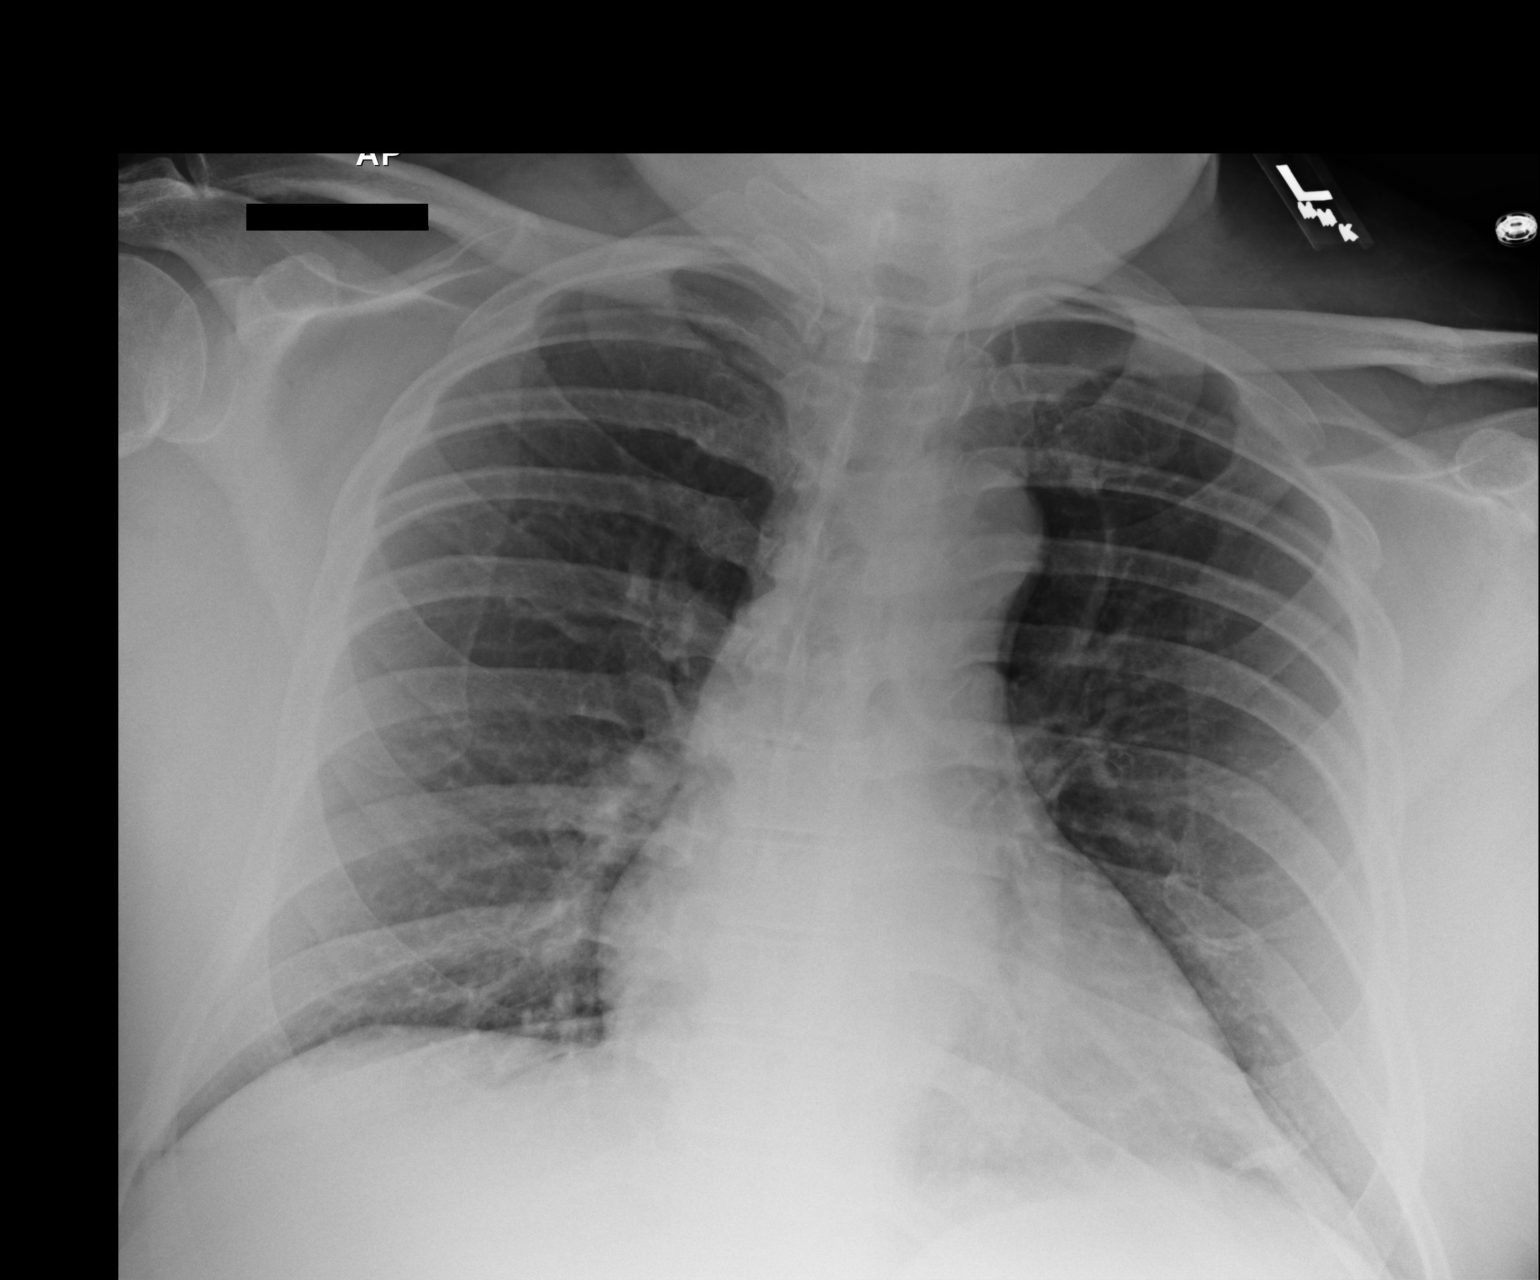

[1 of 1 positions shown; findings below may reference images not displayed]

FINDINGS: Mediastinum hilar structures are normal. Lungs are clear. Heart size
is stable. Normal pulmonary vascularity. No pleural effusion or
pneumothorax. No acute bony abnormality.
IMPRESSION: No acute cardiopulmonary disease.

## 2014-10-07 ENCOUNTER — Ambulatory Visit: Payer: Medicare Other | Admitting: Infectious Diseases

## 2014-10-28 ENCOUNTER — Ambulatory Visit: Payer: Medicare Other | Admitting: Infectious Diseases

## 2014-11-05 ENCOUNTER — Encounter (HOSPITAL_COMMUNITY): Payer: Self-pay | Admitting: Emergency Medicine

## 2014-11-05 ENCOUNTER — Emergency Department (HOSPITAL_COMMUNITY)
Admission: EM | Admit: 2014-11-05 | Discharge: 2014-11-05 | Disposition: A | Discharge status: Home / Self Care | Payer: Medicare Other | Attending: Emergency Medicine | Admitting: Emergency Medicine

## 2014-11-05 DIAGNOSIS — M25561 Pain in right knee: Secondary | ICD-10-CM | POA: Insufficient documentation

## 2014-11-05 DIAGNOSIS — Z7982 Long term (current) use of aspirin: Secondary | ICD-10-CM | POA: Insufficient documentation

## 2014-11-05 DIAGNOSIS — Z87448 Personal history of other diseases of urinary system: Secondary | ICD-10-CM | POA: Diagnosis not present

## 2014-11-05 DIAGNOSIS — I10 Essential (primary) hypertension: Secondary | ICD-10-CM | POA: Diagnosis not present

## 2014-11-05 DIAGNOSIS — E119 Type 2 diabetes mellitus without complications: Secondary | ICD-10-CM | POA: Diagnosis not present

## 2014-11-05 DIAGNOSIS — B20 Human immunodeficiency virus [HIV] disease: Secondary | ICD-10-CM | POA: Insufficient documentation

## 2014-11-05 DIAGNOSIS — M545 Low back pain: Secondary | ICD-10-CM | POA: Diagnosis present

## 2014-11-05 DIAGNOSIS — G8929 Other chronic pain: Secondary | ICD-10-CM | POA: Diagnosis not present

## 2014-11-05 DIAGNOSIS — M5441 Lumbago with sciatica, right side: Secondary | ICD-10-CM | POA: Diagnosis not present

## 2014-11-05 DIAGNOSIS — Z79899 Other long term (current) drug therapy: Secondary | ICD-10-CM | POA: Diagnosis not present

## 2014-11-05 DIAGNOSIS — Z794 Long term (current) use of insulin: Secondary | ICD-10-CM | POA: Diagnosis not present

## 2014-11-05 NOTE — ED Provider Notes (Signed)
History  This chart was scribed for non-physician practitioner, Okey Regal, PA-C,working with Evelina Bucy, MD, by [name], ED Scribe. This patient was seen in room TR11C/TR11C and the patient's care was started at 1:35 PM.  Chief Complaint  Patient presents with  . Knee Pain  . Back Pain   The history is provided by the patient and medical records. No language interpreter was used.    HPI Comments:  Keith [name] is an HIV positive 49 y.o. male with PMHx of chronic knee and chronic back pain who presents to the Emergency Department complaining of severe lower back pain and right knee pain that has been ongoing. Pt reports the back pain radiates from his back down his right leg. He states surgery was suggested to him about one month ago, which is the last time he was seen by Dr. Kennon Holter, that he should have surgery on the knee but he has not done so because the doctor could not guarantee him 100 percent that the pain would be resolved. He reports taking Vicodin 10 mg/325 mg and has taken his last two tablets earlier today. Walking and touching the areas make the pain worse. He denies alleviating factors. He states is ambulatory without assistance. He denies numbness, tingling or weakness of the lower extremities, saddle anesthesia, bowel or bladder incontinence, bruising, wounds, trauma, injury or fall. Pt states he is scheduled to see Dr. Kennon Holter in 12 days (11/17/14).  Past Medical History  Diagnosis Date  . HIV (human immunodeficiency virus infection) 2009    CD4 count 100, VL 13800 (05/01/2010)  . Diabetes type 2, uncontrolled     HgA1c 17.6 (04/27/2010)  . Hypertension   . Genital warts   . Erectile dysfunction   . Chronic knee pain     right  . Osteomyelitis     h/o hand  . Diabetes mellitus   . Chronic pain   . AIDS    Past Surgical History  Procedure Laterality Date  . Multiple extractions with alveoloplasty N/A 01/18/2013    Procedure: MULTIPLE EXTRACION 3, 6,  7, 10, 11, 13, 21, 22, 27, 28, 29, 30 WITH ALVEOLOPLASTY;  Surgeon: Gae Bon, DDS;  Location: [place];  Service: Oral Surgery;  Laterality: N/A;  . Hernia repair    . Minor irrigation and debridement of wound Right 04/22/2014    Procedure: IRRIGATION AND DEBRIDEMENT OF RIGHT NECK ABCESS;  Surgeon: Jerrell Belfast, MD;  Location: [facility] OR;  Service: ENT;  Laterality: Right;  . I&d extremity Left 08/21/2014    Procedure: INCISION AND DRAINAGE LEFT SMALL FINGER;  Surgeon: Leanora Cover, MD;  Location: [place];  Service: Orthopedics;  Laterality: Left;   Family History  Problem Relation Age of Onset  . Hypertension Mother   . Arthritis Father   . Hypertension Father   . Hypertension Brother   . Cancer Maternal Grandmother 32    unknown type of cancer  . Depression Paternal Grandmother    History  Substance Use Topics  . Smoking status: Never Smoker   . Smokeless tobacco: Never Used  . Alcohol Use: No    Review of Systems  All other systems reviewed and are negative.   Allergies  Sulfa antibiotics  Home Medications   Prior to Admission medications   Medication Sig Start Date End Date Taking? Authorizing Provider  aspirin 81 MG EC tablet Take 81 mg by mouth daily.  08/20/11   [name], MD  B-D ULTRAFINE III SHORT PEN  31G X 8 MM MISC  06/01/14   Historical Provider, MD  dapsone 100 MG tablet TAKE 1 TABLET BY MOUTH EVERY DAY. NEED APPOINTMENT 09/19/14   [name], MD  dolutegravir (TIVICAY) 50 MG tablet Take 1 tablet (50 mg total) by mouth daily. 04/24/14   Corky Sox, MD  doxycycline (VIBRAMYCIN) 50 MG capsule Take 2 capsules (100 mg total) by mouth 2 (two) times daily. 08/21/14   Leanora Cover, MD  Insulin Glargine (LANTUS SOLOSTAR) 100 UNIT/ML Solostar Pen Inject 60 Units into the skin daily at 10 pm. 06/01/14   [name], MD  insulin lispro (HUMALOG) 100 UNIT/ML KiwkPen Inject 0.08 mLs (8 Units total) into the skin 3 (three) times daily. 02/21/14   [name], MD   ISENTRESS 400 MG tablet TAKE 1 TABLET BY MOUTH TWICE DAILY. NEED TO MAKE APPOINTMENT 09/19/14   [name], MD  LORazepam (ATIVAN) 1 MG tablet  06/19/14   Historical Provider, MD  NORVIR 100 MG TABS tablet TAKE 1 TABLET BY MOUTH DAILY WITH BREAKFAST 09/19/14   [name], MD  Oxycodone HCl 10 MG TABS Take 1 tablet (10 mg total) by mouth 3 (three) times daily. Patient not taking: Reported on 07/06/2014 06/01/14   [name], MD  oxyCODONE-acetaminophen (PERCOCET) 5-325 MG per tablet 1-2 tabs po q6 hours prn pain 08/21/14   Leanora Cover, MD  pravastatin (PRAVACHOL) 40 MG tablet Take 1 tablet (40 mg total) by mouth daily. 01/03/14   [name], MD  PREZISTA 800 MG tablet TAKE 1 TABLET BY MOUTH DAILY WITH BREAKFAST 09/19/14   [name], MD  testosterone (ANDROGEL) 50 MG/5GM GEL Place 5 g onto the skin daily.     Historical Provider, MD  TRUVADA 200-300 MG per tablet TAKE 1 TABLET BY MOUTH DAILY 09/19/14   [name], MD   Triage Vitals: BP 119/87 mmHg  Pulse 78  Temp(Src) 97.7 F (36.5 C) (Oral)  Resp 12  Wt 279 lb 9 oz (126.809 kg)  SpO2 100%  Physical Exam  Constitutional: He is oriented to person, place, and time. He appears well-developed and well-nourished.  HENT:  Head: Normocephalic and atraumatic.  Eyes: EOM are normal.  Neck: Normal range of motion.  Cardiovascular: Normal rate.   Pulmonary/Chest: Effort normal.  Musculoskeletal: Normal range of motion.  No CT or L-spine tenderness, tenderness to the paralumbar lumbar muscles, no signs or trauma obvious deformity. No signs of abscess. Strength 5 out of 5 in distal extremity, sensation grossly intact, pupils 2+. Patient has tenderness to palpation of the right knee diffusely, no signs of swelling, no redness, full range of motion. She is able to ambulate but with some minor difficulty  Neurological: He is alert and oriented to person, place, and time.  Skin: Skin is warm and dry.  Psychiatric: He has a  normal mood and affect. His behavior is normal.  Nursing note and vitals reviewed.   ED Course  Procedures (including critical care time) DIAGNOSTIC STUDIES: Oxygen Saturation is 100% on RA, normal by my interpretation.   COORDINATION OF CARE: 1:44 PM- Explained our pain medication policy to patient and advised him that we could not prescribe any narcotic pain medication and pt continuously requested a prescription. Informed pt that he would have to follow up with his PCP. Pt verbalizes understanding and agrees to plan.  Medications - No data to display  Labs Review Labs Reviewed - No data to display  Imaging Review  No results found.   EKG Interpretation None      MDM   Final diagnoses:  Right knee pain  Bilateral low back pain with right-sided sciatica    Labs:n/a  Imaging: n/a  Consults: n/a  Therapeutics: n/a  Discharge Meds: n/a  Assessment/Plan: Patient presents with chronic back and knee pain. No red flags, no signs or symptoms that would indicate gout, septic arthritis. Identical symptoms have been present for a long period of time. Patient requesting narcotic pain medication for chronic pain, informed him that he would not receive that here in the ED. He was persistent with request for narcotics, he will not be given any narcotics here. Advised pt that he should contact his PCP to get a refill on his pain medication and take Tylenol or Advil for pain until then. Explained to pt the narcotic pain medication policy. He was given strict return precautions. Encouraged to follow up with primary care in orthopedics for further evaluation and management.   I personally performed the services described in this documentation, which was scribed in my presence. The recorded information has been reviewed and is accurate.    Okey Regal, PA-C 11/05/14 1656  Evelina Bucy, MD 11/06/14 [phone]

## 2014-11-05 NOTE — Discharge Instructions (Signed)
Please follow-up with your orthopedic surgeon as previously scheduled. Please use Tylenol or ibuprofen as needed for pain.

## 2014-11-05 NOTE — ED Notes (Signed)
PT NOTED TO NOT BE IN ROOM. APPEARS PT LEFT AFTER J HEDGES, PA, ADVISED HIM NO NARCOTICS WILL BE GIVEN PER [facility] POLICY.

## 2014-11-05 NOTE — ED Notes (Signed)
REQUESTING PAIN MED TO BE GIVEN UNTIL SEES PCP ON 11/17/14.

## 2014-11-05 NOTE — ED Notes (Signed)
Pt. Stated, I hurt my back when I was young and my right knee hurts off and on.

## 2014-12-10 ENCOUNTER — Encounter (HOSPITAL_COMMUNITY): Payer: Self-pay | Admitting: Emergency Medicine

## 2014-12-10 DIAGNOSIS — Z8619 Personal history of other infectious and parasitic diseases: Secondary | ICD-10-CM | POA: Insufficient documentation

## 2014-12-10 DIAGNOSIS — Z8739 Personal history of other diseases of the musculoskeletal system and connective tissue: Secondary | ICD-10-CM | POA: Diagnosis not present

## 2014-12-10 DIAGNOSIS — Z87438 Personal history of other diseases of male genital organs: Secondary | ICD-10-CM | POA: Insufficient documentation

## 2014-12-10 DIAGNOSIS — I10 Essential (primary) hypertension: Secondary | ICD-10-CM | POA: Diagnosis not present

## 2014-12-10 DIAGNOSIS — Z794 Long term (current) use of insulin: Secondary | ICD-10-CM | POA: Diagnosis not present

## 2014-12-10 DIAGNOSIS — G8929 Other chronic pain: Secondary | ICD-10-CM | POA: Diagnosis not present

## 2014-12-10 DIAGNOSIS — W19XXXA Unspecified fall, initial encounter: Secondary | ICD-10-CM | POA: Insufficient documentation

## 2014-12-10 DIAGNOSIS — Y999 Unspecified external cause status: Secondary | ICD-10-CM | POA: Insufficient documentation

## 2014-12-10 DIAGNOSIS — R42 Dizziness and giddiness: Secondary | ICD-10-CM | POA: Diagnosis not present

## 2014-12-10 DIAGNOSIS — R631 Polydipsia: Secondary | ICD-10-CM | POA: Diagnosis not present

## 2014-12-10 DIAGNOSIS — S8991XA Unspecified injury of right lower leg, initial encounter: Secondary | ICD-10-CM | POA: Diagnosis not present

## 2014-12-10 DIAGNOSIS — Y929 Unspecified place or not applicable: Secondary | ICD-10-CM | POA: Diagnosis not present

## 2014-12-10 DIAGNOSIS — B20 Human immunodeficiency virus [HIV] disease: Secondary | ICD-10-CM | POA: Insufficient documentation

## 2014-12-10 DIAGNOSIS — Z79899 Other long term (current) drug therapy: Secondary | ICD-10-CM | POA: Insufficient documentation

## 2014-12-10 DIAGNOSIS — Z792 Long term (current) use of antibiotics: Secondary | ICD-10-CM | POA: Diagnosis not present

## 2014-12-10 DIAGNOSIS — E1165 Type 2 diabetes mellitus with hyperglycemia: Secondary | ICD-10-CM | POA: Diagnosis not present

## 2014-12-10 DIAGNOSIS — Z7982 Long term (current) use of aspirin: Secondary | ICD-10-CM | POA: Insufficient documentation

## 2014-12-10 DIAGNOSIS — Y939 Activity, unspecified: Secondary | ICD-10-CM | POA: Diagnosis not present

## 2014-12-10 LAB — CBG MONITORING, ED: Glucose-Capillary: 590 mg/dL (ref 65–99)

## 2014-12-10 LAB — CBC
HCT: 37.2 % — ABNORMAL LOW (ref 39.0–52.0)
Hemoglobin: 12.3 g/dL — ABNORMAL LOW (ref 13.0–17.0)
MCH: 27.4 pg (ref 26.0–34.0)
MCHC: 33.1 g/dL (ref 30.0–36.0)
MCV: 82.9 fL (ref 78.0–100.0)
Platelets: 248 10*3/uL (ref 150–400)
RBC: 4.49 MIL/uL (ref 4.22–5.81)
RDW: 13.3 % (ref 11.5–15.5)
WBC: 7.5 10*3/uL (ref 4.0–10.5)

## 2014-12-10 LAB — URINALYSIS, ROUTINE W REFLEX MICROSCOPIC
Bilirubin Urine: NEGATIVE
Glucose, UA: >1000 mg/dL — AB
Hgb urine dipstick: NEGATIVE
Ketones, ur: NEGATIVE mg/dL
Leukocytes, UA: NEGATIVE
Nitrite: NEGATIVE
Protein, ur: NEGATIVE mg/dL
Specific Gravity, Urine: 1.029 (ref 1.005–1.030)
Urobilinogen, UA: 1 mg/dL (ref 0.0–1.0)
pH: 5.5 (ref 5.0–8.0)

## 2014-12-10 LAB — URINE MICROSCOPIC-ADD ON

## 2014-12-10 NOTE — ED Notes (Signed)
Patient here with complaint of possible hyperglycemia and right knee pain. States his knee has been hurting worse in recent days. States he has been feeling dizzy today and that prompted him to be conncerned about his blood sugars. Did not check his CBG prior to coming to the hospital. CBG was 592.

## 2014-12-11 ENCOUNTER — Emergency Department (HOSPITAL_COMMUNITY)
Admission: EM | Admit: 2014-12-11 | Discharge: 2014-12-11 | Disposition: A | Discharge status: Home / Self Care | Payer: Medicare Other | Attending: Emergency Medicine | Admitting: Emergency Medicine

## 2014-12-11 ENCOUNTER — Emergency Department (HOSPITAL_COMMUNITY): Payer: Medicare Other

## 2014-12-11 DIAGNOSIS — M25561 Pain in right knee: Secondary | ICD-10-CM

## 2014-12-11 DIAGNOSIS — S8991XA Unspecified injury of right lower leg, initial encounter: Secondary | ICD-10-CM | POA: Diagnosis not present

## 2014-12-11 DIAGNOSIS — R739 Hyperglycemia, unspecified: Secondary | ICD-10-CM

## 2014-12-11 DIAGNOSIS — G8929 Other chronic pain: Secondary | ICD-10-CM

## 2014-12-11 LAB — BASIC METABOLIC PANEL
Anion gap: 10 (ref 5–15)
BUN: 9 mg/dL (ref 6–20)
CO2: 25 mmol/L (ref 22–32)
Calcium: 9.5 mg/dL (ref 8.9–10.3)
Chloride: 92 mmol/L — ABNORMAL LOW (ref 101–111)
Creatinine, Ser: 0.97 mg/dL (ref 0.61–1.24)
GFR calc Af Amer: >60 mL/min (ref >60)
GFR calc non Af Amer: >60 mL/min (ref >60)
Glucose, Bld: 563 mg/dL (ref 65–99)
Potassium: 4.3 mmol/L (ref 3.5–5.1)
Sodium: 127 mmol/L — ABNORMAL LOW (ref 135–145)

## 2014-12-11 LAB — CBG MONITORING, ED
Glucose-Capillary: 379 mg/dL — ABNORMAL HIGH (ref 65–99)
Glucose-Capillary: 481 mg/dL — ABNORMAL HIGH (ref 65–99)
Glucose-Capillary: 339 mg/dL — ABNORMAL HIGH (ref 65–99)

## 2014-12-11 MED ORDER — ACETAMINOPHEN 500 MG PO TABS
1000.0000 mg | ORAL_TABLET | Freq: Once | ORAL | Status: AC
  Administered 2014-12-11: 1000 mg via ORAL
  Filled 2014-12-11: qty 2

## 2014-12-11 MED ORDER — INSULIN ASPART 100 UNIT/ML ~~LOC~~ SOLN
10.0000 [IU] | Freq: Once | SUBCUTANEOUS | Status: AC
  Administered 2014-12-11: 10 [IU] via INTRAVENOUS
  Filled 2014-12-11: qty 1

## 2014-12-11 MED ORDER — SODIUM CHLORIDE 0.9 % IV BOLUS (SEPSIS)
1000.0000 mL | Freq: Once | INTRAVENOUS | Status: AC
  Administered 2014-12-11: 1000 mL via INTRAVENOUS

## 2014-12-11 MED ORDER — KETOROLAC TROMETHAMINE 30 MG/ML IJ SOLN
30.0000 mg | Freq: Once | INTRAMUSCULAR | Status: AC
  Administered 2014-12-11: 30 mg via INTRAVENOUS
  Filled 2014-12-11: qty 1

## 2014-12-11 NOTE — ED Notes (Signed)
cbg 339

## 2014-12-11 NOTE — ED Notes (Signed)
Pt states he had to pee a lot and checked his sugar and it was over 600.  Pt states he checks his sugar 1 time a day before bed and covers it with 60 u lanis.  Pt states he doesn't check his sugar before meals.  Pt states he has no other symptoms except frequent urination.

## 2014-12-11 NOTE — ED Notes (Signed)
Off unit with xray. 

## 2014-12-11 NOTE — ED Provider Notes (Signed)
CSN: [id number]   Arrival date & time 12/10/14 2309  History  This chart was scribed for Sherwood Gambler, MD by Altamease Oiler, ED Scribe. This patient was seen in room B19C/B19C and the patient's care was started at 1:51 AM.  Chief Complaint  Patient presents with  . Knee Pain  . Hyperglycemia    HPI The history is provided by the patient. No language interpreter was used.   Keith [name] is a 49 y.o. male with PMHx of HIV, DM, HTN, and chronic knee pain who presents to the Emergency Department complaining of chronic right knee pain exacerbation with onset 3 days ago after falling. He describes the constant pain as aching and rates it 9/10 in severity.  The pain is worse with movement.   He has a scheduled appointment on 9/13 for evaluation of his knee pain. Also complains of hyperglycemia. Associated symptoms include polydipsia and light-headedness. Pt denies chest pain, SOB, abdominal pain, vomiting, weakness, and numbness.  Past Medical History  Diagnosis Date  . HIV (human immunodeficiency virus infection) 2009    CD4 count 100, VL 13800 (05/01/2010)  . Diabetes type 2, uncontrolled     HgA1c 17.6 (04/27/2010)  . Hypertension   . Genital warts   . Erectile dysfunction   . Chronic knee pain     right  . Osteomyelitis     h/o hand  . Diabetes mellitus   . Chronic pain   . AIDS     Past Surgical History  Procedure Laterality Date  . Multiple extractions with alveoloplasty N/A 01/18/2013    Procedure: MULTIPLE EXTRACION 3, 6, 7, 10, 11, 13, 21, 22, 27, 28, 29, 30 WITH ALVEOLOPLASTY;  Surgeon: Gae Bon, DDS;  Location: [place];  Service: Oral Surgery;  Laterality: N/A;  . Hernia repair    . Minor irrigation and debridement of wound Right 04/22/2014    Procedure: IRRIGATION AND DEBRIDEMENT OF RIGHT NECK ABCESS;  Surgeon: Jerrell Belfast, MD;  Location: [facility] OR;  Service: ENT;  Laterality: Right;  . I&d extremity Left 08/21/2014    Procedure: INCISION AND DRAINAGE LEFT SMALL  FINGER;  Surgeon: Leanora Cover, MD;  Location: [place];  Service: Orthopedics;  Laterality: Left;    Family History  Problem Relation Age of Onset  . Hypertension Mother   . Arthritis Father   . Hypertension Father   . Hypertension Brother   . Cancer Maternal Grandmother 81    unknown type of cancer  . Depression Paternal Grandmother     Social History  Substance Use Topics  . Smoking status: Never Smoker   . Smokeless tobacco: Never Used  . Alcohol Use: No     Review of Systems  Gastrointestinal: Negative for nausea, vomiting and abdominal pain.  Endocrine: Positive for polydipsia.  Musculoskeletal:       Right knee pain  Neurological: Positive for light-headedness.  All other systems reviewed and are negative.   Home Medications   Prior to Admission medications   Medication Sig Start Date End Date Taking? Authorizing Provider  aspirin 81 MG EC tablet Take 81 mg by mouth daily.  08/20/11   [name], MD  B-D ULTRAFINE III SHORT PEN 31G X 8 MM MISC  06/01/14   Historical Provider, MD  dapsone 100 MG tablet TAKE 1 TABLET BY MOUTH EVERY DAY. NEED APPOINTMENT 09/19/14   [name], MD  dolutegravir (TIVICAY) 50 MG tablet Take 1 tablet (50 mg total) by mouth daily. 04/24/14  Corky Sox, MD  doxycycline (VIBRAMYCIN) 50 MG capsule Take 2 capsules (100 mg total) by mouth 2 (two) times daily. 08/21/14   Leanora Cover, MD  Insulin Glargine (LANTUS SOLOSTAR) 100 UNIT/ML Solostar Pen Inject 60 Units into the skin daily at 10 pm. 06/01/14   [name], MD  insulin lispro (HUMALOG) 100 UNIT/ML KiwkPen Inject 0.08 mLs (8 Units total) into the skin 3 (three) times daily. 02/21/14   [name], MD  ISENTRESS 400 MG tablet TAKE 1 TABLET BY MOUTH TWICE DAILY. NEED TO MAKE APPOINTMENT 09/19/14   [name], MD  LORazepam (ATIVAN) 1 MG tablet  06/19/14   Historical Provider, MD  NORVIR 100 MG TABS tablet TAKE 1 TABLET BY MOUTH DAILY WITH BREAKFAST 09/19/14   [name],  MD  Oxycodone HCl 10 MG TABS Take 1 tablet (10 mg total) by mouth 3 (three) times daily. Patient not taking: Reported on 07/06/2014 06/01/14   [name], MD  oxyCODONE-acetaminophen (PERCOCET) 5-325 MG per tablet 1-2 tabs po q6 hours prn pain 08/21/14   Leanora Cover, MD  pravastatin (PRAVACHOL) 40 MG tablet Take 1 tablet (40 mg total) by mouth daily. 01/03/14   [name], MD  PREZISTA 800 MG tablet TAKE 1 TABLET BY MOUTH DAILY WITH BREAKFAST 09/19/14   [name], MD  testosterone (ANDROGEL) 50 MG/5GM GEL Place 5 g onto the skin daily.     Historical Provider, MD  TRUVADA 200-300 MG per tablet TAKE 1 TABLET BY MOUTH DAILY 09/19/14   [name], MD    Allergies  Sulfa antibiotics  Triage Vitals: BP 125/90 mmHg  Pulse 78  Temp(Src) 97.6 F (36.4 C) (Oral)  Resp 18  SpO2 99%  Physical Exam  Constitutional: He is oriented to person, place, and time. He appears well-developed and well-nourished.  HENT:  Head: Normocephalic and atraumatic.  Right Ear: External ear normal.  Left Ear: External ear normal.  Nose: Nose normal.  Eyes: Right eye exhibits no discharge. Left eye exhibits no discharge.  Neck: Neck supple.  Cardiovascular: Normal rate, regular rhythm, normal heart sounds and intact distal pulses.   Pulmonary/Chest: Effort normal and breath sounds normal.  Abdominal: Soft. There is no tenderness.  Musculoskeletal: He exhibits no edema.  Right knee: Tenderness over right patella with no swelling or effusion, normal strength and sensation. No redness/warmth Pain with ROM but no decreased ROM 2+ DP pulse  Neurological: He is alert and oriented to person, place, and time.  Skin: Skin is warm and dry.  Nursing note and vitals reviewed.   ED Course  Procedures   DIAGNOSTIC STUDIES: Oxygen Saturation is 99% on RA, normal by my interpretation.    COORDINATION OF CARE: 1:56 AM Discussed treatment plan which includes right knee XR, lab work,  Toradol, and  IVF  with pt at bedside and pt agreed to plan.  Labs Reviewed  BASIC METABOLIC PANEL - Abnormal; Notable for the following:    Sodium 127 (*)    Chloride 92 (*)    Glucose, Bld 563 (*)    All other components within normal limits  CBC - Abnormal; Notable for the following:    Hemoglobin 12.3 (*)    HCT 37.2 (*)    All other components within normal limits  URINALYSIS, ROUTINE W REFLEX MICROSCOPIC (NOT AT [facility]) - Abnormal; Notable for the following:    Glucose, UA >1000 (*)    All other components within normal limits  CBG  MONITORING, ED - Abnormal; Notable for the following:    Glucose-Capillary 590 (*)    All other components within normal limits  CBG MONITORING, ED - Abnormal; Notable for the following:    Glucose-Capillary 481 (*)    All other components within normal limits  CBG MONITORING, ED - Abnormal; Notable for the following:    Glucose-Capillary 379 (*)    All other components within normal limits  CBG MONITORING, ED - Abnormal; Notable for the following:    Glucose-Capillary 339 (*)    All other components within normal limits  URINE MICROSCOPIC-ADD ON    I, Sherwood Gambler, MD, personally reviewed and evaluated these images and lab results as part of my medical decision-making.  Imaging Review Dg Knee Complete 4 Views Right  12/11/2014   CLINICAL DATA:  Acute onset of right knee pain.  Initial encounter.  EXAM: RIGHT KNEE - COMPLETE 4+ VIEW  COMPARISON:  Right knee radiographs performed 01/23/2014  FINDINGS: There is no evidence of fracture or dislocation. The joint spaces are preserved. No significant degenerative change is seen; the patellofemoral joint is grossly unremarkable in appearance.  No significant joint effusion is seen. The visualized soft tissues are normal in appearance.  IMPRESSION: No evidence of fracture or dislocation.   Electronically Signed   By: Garald Balding M.D.   On: 12/11/2014 02:56        MDM   Final diagnoses:  Hyperglycemia  Chronic knee  pain, right   Patient with hyperglycemia without ketosis or acidosis. Likely the symptomatic reason for his lightheadedness. This has improved with IV fluids. Given IV insulin given degree of hyperglycemia. Knee pain is acute on chronic. Painful but no effusion, signs of infection and is NV intact. Given the chronicity of his pain I do not feel narcotics are warranted, recommend ibuprofen/tylenol. F/u with PCP.  I personally performed the services described in this documentation, which was scribed in my presence. The recorded information has been reviewed and is accurate.   Sherwood Gambler, MD 12/11/14 [phone]

## 2014-12-11 NOTE — ED Notes (Signed)
Pt verbalized understanding of d/c instructions and has no further questions. Pt discharged home in NAD.

## 2014-12-17 ENCOUNTER — Encounter (HOSPITAL_COMMUNITY): Payer: Self-pay | Admitting: Emergency Medicine

## 2014-12-17 ENCOUNTER — Emergency Department (HOSPITAL_COMMUNITY)
Admission: EM | Admit: 2014-12-17 | Discharge: 2014-12-17 | Disposition: A | Discharge status: Home / Self Care | Payer: Medicare Other | Attending: Emergency Medicine | Admitting: Emergency Medicine

## 2014-12-17 DIAGNOSIS — L03012 Cellulitis of left finger: Secondary | ICD-10-CM | POA: Diagnosis not present

## 2014-12-17 DIAGNOSIS — Z794 Long term (current) use of insulin: Secondary | ICD-10-CM | POA: Insufficient documentation

## 2014-12-17 DIAGNOSIS — I10 Essential (primary) hypertension: Secondary | ICD-10-CM | POA: Diagnosis not present

## 2014-12-17 DIAGNOSIS — E119 Type 2 diabetes mellitus without complications: Secondary | ICD-10-CM | POA: Insufficient documentation

## 2014-12-17 DIAGNOSIS — Z79899 Other long term (current) drug therapy: Secondary | ICD-10-CM | POA: Diagnosis not present

## 2014-12-17 DIAGNOSIS — G8929 Other chronic pain: Secondary | ICD-10-CM | POA: Insufficient documentation

## 2014-12-17 DIAGNOSIS — Z7982 Long term (current) use of aspirin: Secondary | ICD-10-CM | POA: Insufficient documentation

## 2014-12-17 DIAGNOSIS — B20 Human immunodeficiency virus [HIV] disease: Secondary | ICD-10-CM | POA: Diagnosis not present

## 2014-12-17 DIAGNOSIS — Z87438 Personal history of other diseases of male genital organs: Secondary | ICD-10-CM | POA: Diagnosis not present

## 2014-12-17 DIAGNOSIS — M79642 Pain in left hand: Secondary | ICD-10-CM | POA: Diagnosis present

## 2014-12-17 LAB — BASIC METABOLIC PANEL
Anion gap: 9 (ref 5–15)
BUN: 7 mg/dL (ref 6–20)
CO2: 27 mmol/L (ref 22–32)
Calcium: 9.4 mg/dL (ref 8.9–10.3)
Chloride: 93 mmol/L — ABNORMAL LOW (ref 101–111)
Creatinine, Ser: 1 mg/dL (ref 0.61–1.24)
GFR calc Af Amer: >60 mL/min (ref >60)
GFR calc non Af Amer: >60 mL/min (ref >60)
Glucose, Bld: 470 mg/dL — ABNORMAL HIGH (ref 65–99)
Potassium: 4.4 mmol/L (ref 3.5–5.1)
Sodium: 129 mmol/L — ABNORMAL LOW (ref 135–145)

## 2014-12-17 LAB — CBC WITH DIFFERENTIAL/PLATELET
Basophils Absolute: 0 10*3/uL (ref 0.0–0.1)
Basophils Relative: 0 % (ref 0–1)
Eosinophils Absolute: 0.1 10*3/uL (ref 0.0–0.7)
Eosinophils Relative: 1 % (ref 0–5)
HCT: 38 % — ABNORMAL LOW (ref 39.0–52.0)
Hemoglobin: 12.7 g/dL — ABNORMAL LOW (ref 13.0–17.0)
Lymphocytes Relative: 26 % (ref 12–46)
Lymphs Abs: 2.3 10*3/uL (ref 0.7–4.0)
MCH: 27.8 pg (ref 26.0–34.0)
MCHC: 33.4 g/dL (ref 30.0–36.0)
MCV: 83.2 fL (ref 78.0–100.0)
Monocytes Absolute: 0.5 10*3/uL (ref 0.1–1.0)
Monocytes Relative: 5 % (ref 3–12)
Neutro Abs: 5.9 10*3/uL (ref 1.7–7.7)
Neutrophils Relative %: 68 % (ref 43–77)
Platelets: 239 10*3/uL (ref 150–400)
RBC: 4.57 MIL/uL (ref 4.22–5.81)
RDW: 13.2 % (ref 11.5–15.5)
WBC: 8.7 10*3/uL (ref 4.0–10.5)

## 2014-12-17 MED ORDER — CEPHALEXIN 500 MG PO CAPS
500.0000 mg | ORAL_CAPSULE | Freq: Four times a day (QID) | ORAL | Status: DC
Start: 2014-12-17 — End: 2015-03-03

## 2014-12-17 MED ORDER — HYDROCODONE-ACETAMINOPHEN 5-325 MG PO TABS: 1.0000 | ORAL_TABLET | Freq: Four times a day (QID) | ORAL | Status: DC | PRN

## 2014-12-17 NOTE — ED Notes (Addendum)
MD at bedside, Dr. Stark Jock opened and drained finger.

## 2014-12-17 NOTE — ED Notes (Signed)
C/o pain and swelling to L index finger since "biting a hang nail off" 2 days ago.

## 2014-12-17 NOTE — Discharge Instructions (Signed)
Keflex as prescribed.  Hydrocodone as prescribed as needed for pain.  Warm soaks to the area as frequently as possible for the next several days.  Return to the emergency department for increased redness, pain, or streaking up the hand or arm.   Fingertip Infection When an infection is around the nail, it is called a paronychia. When it appears over the tip of the finger, it is called a felon. These infections are due to minor injuries or cracks in the skin. If they are not treated properly, they can lead to bone infection and permanent damage to the fingernail. Incision and drainage is necessary if a pus pocket (an abscess) has formed. Antibiotics and pain medicine may also be needed. Keep your hand elevated for the next 2-3 days to reduce swelling and pain. If a pack was placed in the abscess, it should be removed in 1-2 days by your caregiver. Soak the finger in warm water for 20 minutes 4 times daily to help promote drainage. Keep the hands as dry as possible. Wear protective gloves with cotton liners. See your caregiver for follow-up care as recommended.  HOME CARE INSTRUCTIONS   Keep wound clean, dry and dressed as suggested by your caregiver.  Soak in warm salt water for fifteen minutes, four times per day for bacterial infections.  Your caregiver will prescribe an antibiotic if a bacterial infection is suspected. Take antibiotics as directed and finish the prescription, even if the problem appears to be improving before the medicine is gone.  Only take over-the-counter or prescription medicines for pain, discomfort, or fever as directed by your caregiver. SEEK IMMEDIATE MEDICAL CARE IF:  There is redness, swelling, or increasing pain in the wound.  Pus or any other unusual drainage is coming from the wound.  An unexplained oral temperature above 102 F (38.9 C) develops.  You notice a foul smell coming from the wound or dressing. MAKE SURE YOU:   Understand these  instructions.  Monitor your condition.  Contact your caregiver if you are getting worse or not improving. Document Released: 05/02/2004 Document Revised: 06/17/2011 Document Reviewed: 04/28/2008 [facility] Patient Information 2015 [place], [place]. This information is not intended to replace advice given to you by your health care provider. Make sure you discuss any questions you have with your health care provider.

## 2014-12-17 NOTE — ED Notes (Signed)
Pt requesting something for pain.  Dr. Stark Jock made aware.

## 2014-12-20 IMAGING — CR DG CHEST 2V
2 series · 2 of 2 positions shown · non-contrast
Comparison: 11/09/2013

CLINICAL DATA: Hyperglycemia. Shortness of breath and cough for 1
week. Initial encounter

EXAM:
CHEST  2 VIEW

[w chest pa]
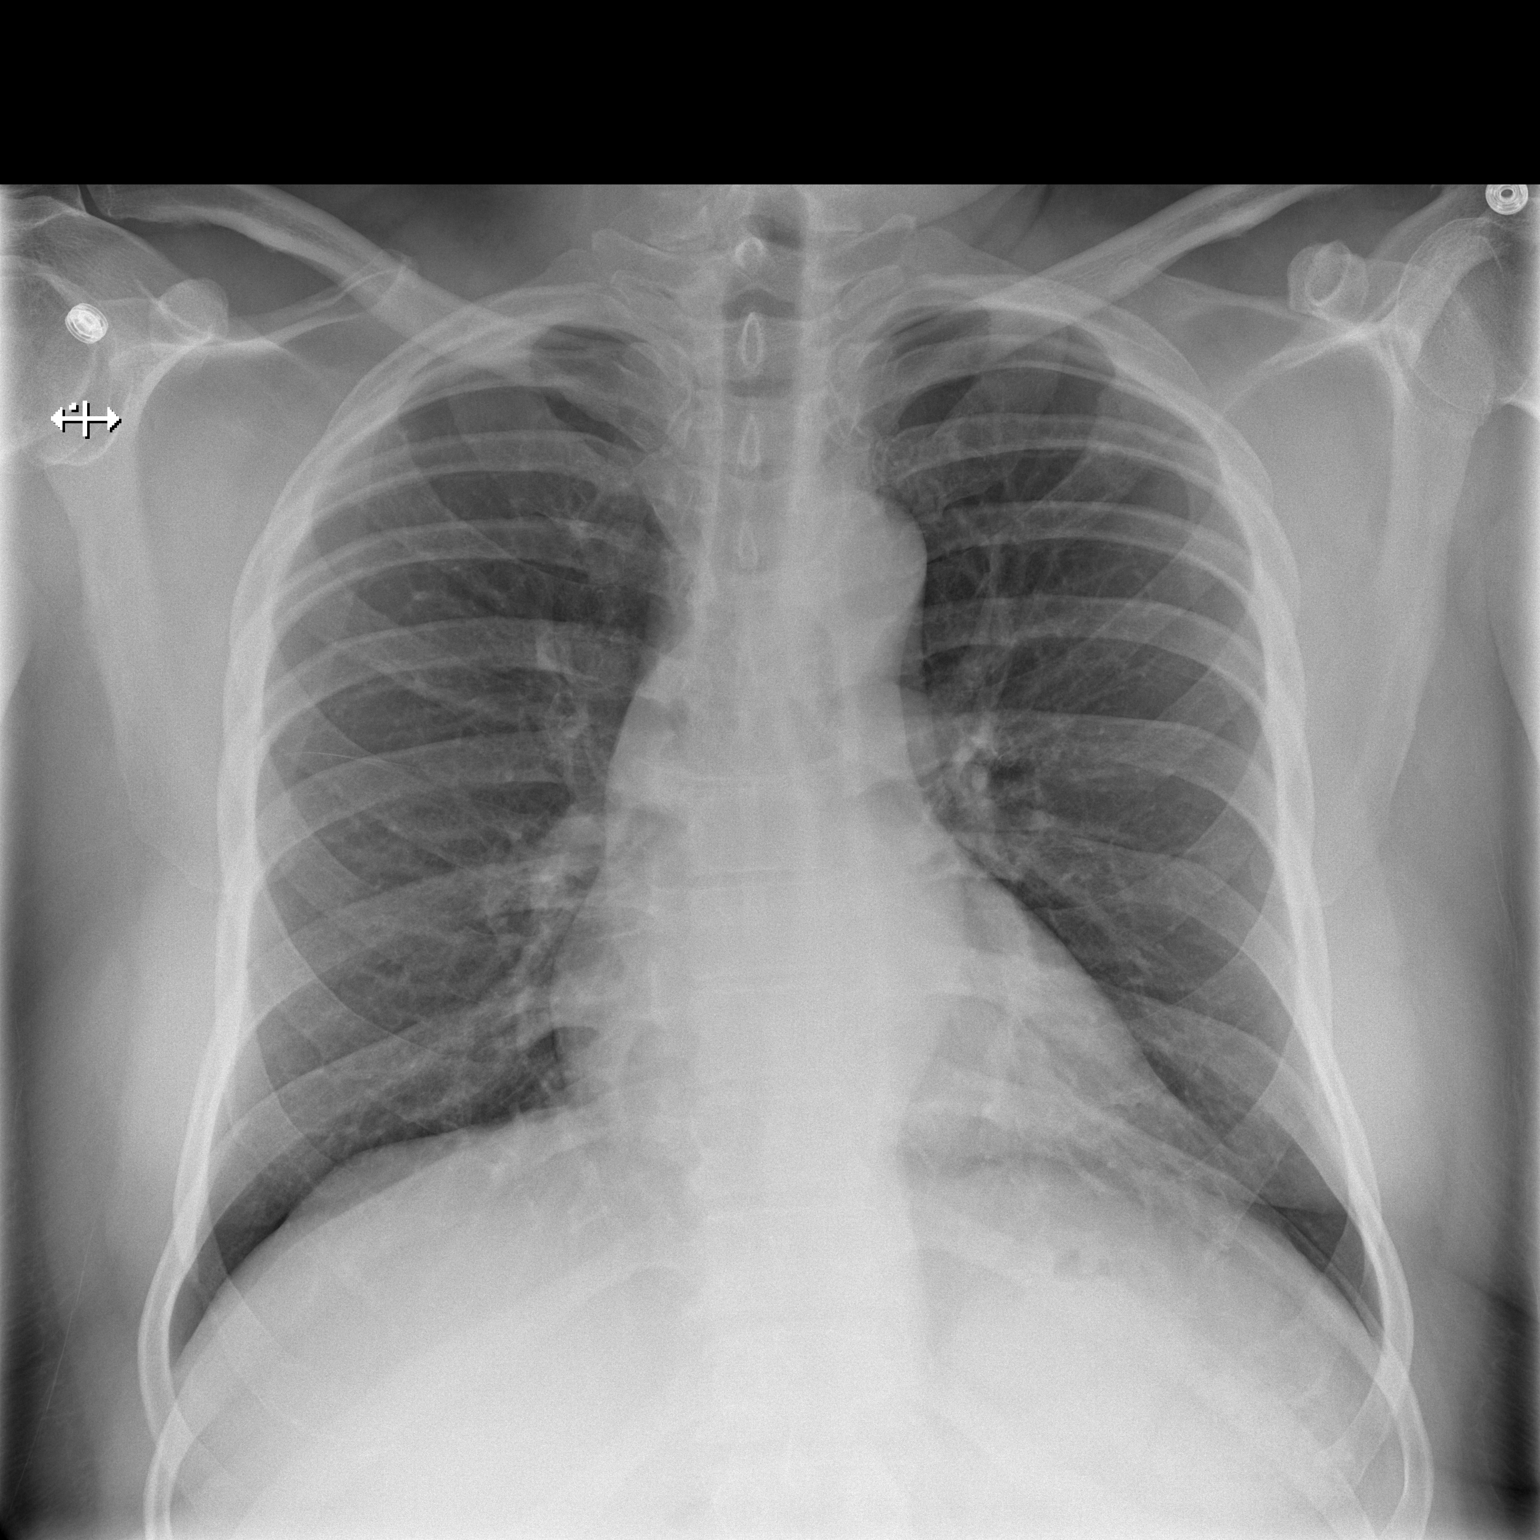

[w chest lat]
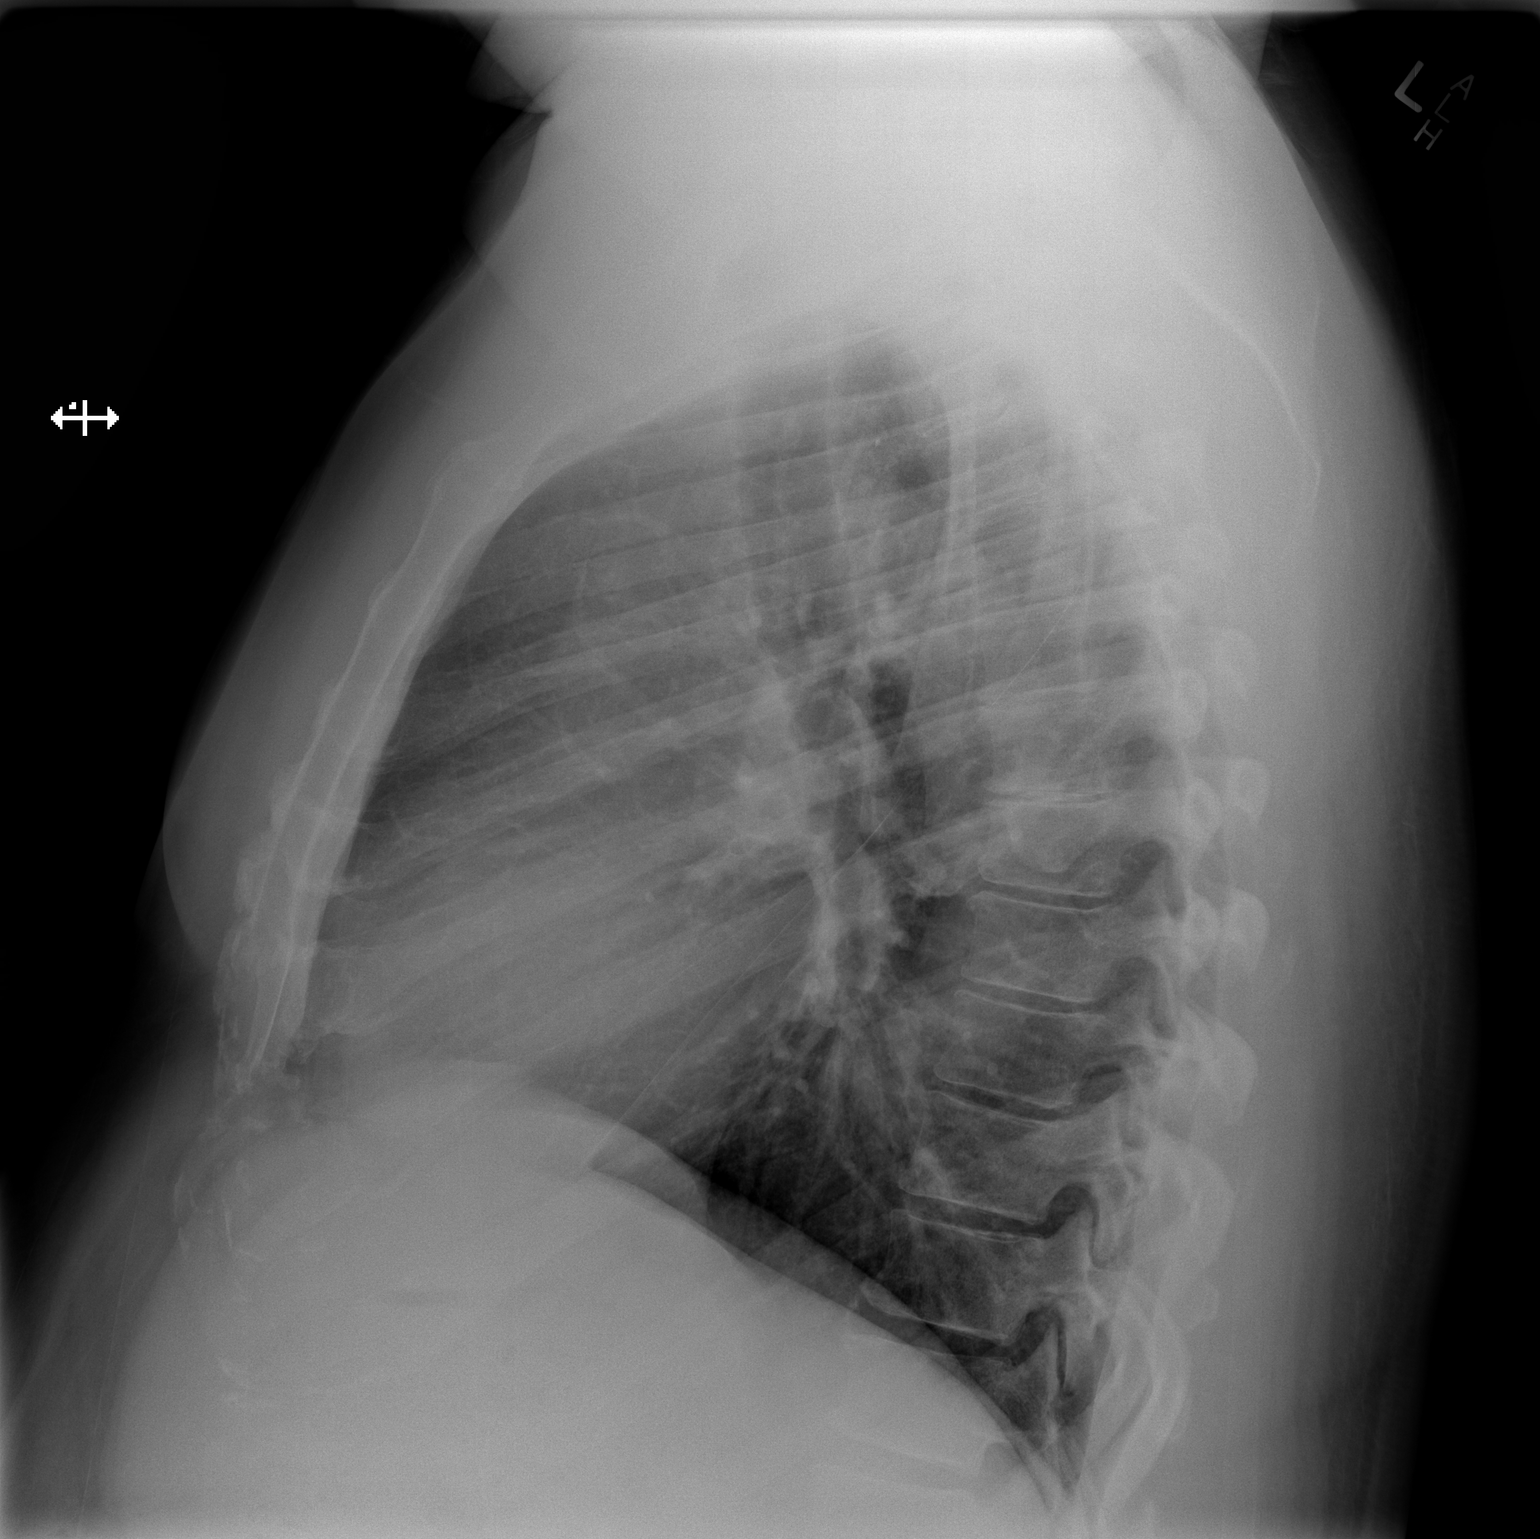

[2 of 2 positions shown; findings below may reference images not displayed]

FINDINGS: Normal heart size. Chronic mild aortic tortuosity. Negative hila.
There is no edema, consolidation, effusion, or pneumothorax.
IMPRESSION: No active cardiopulmonary disease.

## 2015-01-04 ENCOUNTER — Ambulatory Visit: Payer: Medicare Other | Admitting: Infectious Diseases

## 2015-01-06 ENCOUNTER — Other Ambulatory Visit: Payer: Medicare Other

## 2015-01-06 DIAGNOSIS — Z113 Encounter for screening for infections with a predominantly sexual mode of transmission: Secondary | ICD-10-CM

## 2015-01-06 DIAGNOSIS — B20 Human immunodeficiency virus [HIV] disease: Secondary | ICD-10-CM

## 2015-01-06 LAB — COMPLETE METABOLIC PANEL WITH GFR
ALT: 11 U/L (ref 9–46)
AST: 17 U/L (ref 10–35)
Albumin: 3.9 g/dL (ref 3.6–5.1)
Alkaline Phosphatase: 79 U/L (ref 40–115)
BUN: 12 mg/dL (ref 7–25)
CO2: 28 mmol/L (ref 20–31)
Calcium: 9.3 mg/dL (ref 8.6–10.3)
Chloride: 98 mmol/L (ref 98–110)
Creat: 0.84 mg/dL (ref 0.70–1.33)
GFR, Est African American: >89 mL/min (ref >=60)
GFR, Est Non African American: >89 mL/min (ref >=60)
Glucose, Bld: 305 mg/dL — ABNORMAL HIGH (ref 65–99)
Potassium: 3.9 mmol/L (ref 3.5–5.3)
Sodium: 133 mmol/L — ABNORMAL LOW (ref 135–146)
Total Bilirubin: 0.5 mg/dL (ref 0.2–1.2)
Total Protein: 7.4 g/dL (ref 6.1–8.1)

## 2015-01-06 LAB — CBC
HCT: 38.1 % — ABNORMAL LOW (ref 39.0–52.0)
Hemoglobin: 12.4 g/dL — ABNORMAL LOW (ref 13.0–17.0)
MCH: 27.1 pg (ref 26.0–34.0)
MCHC: 32.5 g/dL (ref 30.0–36.0)
MCV: 83.4 fL (ref 78.0–100.0)
MPV: 9.6 fL (ref 8.6–12.4)
Platelets: 264 10*3/uL (ref 150–400)
RBC: 4.57 MIL/uL (ref 4.22–5.81)
RDW: 13.9 % (ref 11.5–15.5)
WBC: 5.4 10*3/uL (ref 4.0–10.5)

## 2015-01-06 LAB — T-HELPER CELL (CD4) - (RCID CLINIC ONLY)
CD4 % Helper T Cell: 8 % — ABNORMAL LOW (ref 33–55)
CD4 T Cell Abs: 140 /uL — ABNORMAL LOW (ref 400–2700)

## 2015-01-06 LAB — RPR

## 2015-01-10 LAB — HIV-1 RNA QUANT-NO REFLEX-BLD
HIV 1 RNA Quant: <20 copies/mL (ref <20)
HIV-1 RNA Quant, Log: <1.3 {Log} (ref <1.30)

## 2015-01-20 NOTE — ED Provider Notes (Signed)
CSN: [id number]     Arrival date & time 12/17/14  0011 History   First MD Initiated Contact with Patient 12/17/14 0104     Chief Complaint  Patient presents with  . Hand Pain     (Consider location/radiation/quality/duration/timing/severity/associated sxs/prior Treatment) HPI Comments: Increased pain and swelling in the left index finger since biting off a nail 2 days ago.  Patient is a 49 y.o. male presenting with hand pain. The history is provided by the patient.  Hand Pain This is a new problem. The current episode started yesterday. The problem occurs constantly. The problem has been gradually worsening. Nothing aggravates the symptoms. Nothing relieves the symptoms. He has tried nothing for the symptoms.    Past Medical History  Diagnosis Date  . HIV (human immunodeficiency virus infection) 2009    CD4 count 100, VL 13800 (05/01/2010)  . Diabetes type 2, uncontrolled     HgA1c 17.6 (04/27/2010)  . Hypertension   . Genital warts   . Erectile dysfunction   . Chronic knee pain     right  . Osteomyelitis     h/o hand  . Diabetes mellitus   . Chronic pain   . AIDS    Past Surgical History  Procedure Laterality Date  . Multiple extractions with alveoloplasty N/A 01/18/2013    Procedure: MULTIPLE EXTRACION 3, 6, 7, 10, 11, 13, 21, 22, 27, 28, 29, 30 WITH ALVEOLOPLASTY;  Surgeon: Gae Bon, DDS;  Location: [place];  Service: Oral Surgery;  Laterality: N/A;  . Hernia repair    . Minor irrigation and debridement of wound Right 04/22/2014    Procedure: IRRIGATION AND DEBRIDEMENT OF RIGHT NECK ABCESS;  Surgeon: Jerrell Belfast, MD;  Location: [facility] OR;  Service: ENT;  Laterality: Right;  . I&d extremity Left 08/21/2014    Procedure: INCISION AND DRAINAGE LEFT SMALL FINGER;  Surgeon: Leanora Cover, MD;  Location: [place];  Service: Orthopedics;  Laterality: Left;   Family History  Problem Relation Age of Onset  . Hypertension Mother   . Arthritis Father   . Hypertension Father   .  Hypertension Brother   . Cancer Maternal Grandmother 70    unknown type of cancer  . Depression Paternal Grandmother    Social History  Substance Use Topics  . Smoking status: Never Smoker   . Smokeless tobacco: Never Used  . Alcohol Use: No    Review of Systems  All other systems reviewed and are negative.     Allergies  Sulfa antibiotics  Home Medications   Prior to Admission medications   Medication Sig Start Date End Date Taking? Authorizing Provider  aspirin 81 MG EC tablet Take 81 mg by mouth daily.  08/20/11   [name], MD  cephALEXin (KEFLEX) 500 MG capsule Take 1 capsule (500 mg total) by mouth 4 (four) times daily. 12/17/14   Veryl Speak, MD  dapsone 100 MG tablet TAKE 1 TABLET BY MOUTH EVERY DAY. NEED APPOINTMENT 09/19/14   [name], MD  dolutegravir (TIVICAY) 50 MG tablet Take 1 tablet (50 mg total) by mouth daily. 04/24/14   Corky Sox, MD  doxycycline (VIBRAMYCIN) 50 MG capsule Take 2 capsules (100 mg total) by mouth 2 (two) times daily. 08/21/14   Leanora Cover, MD  HYDROcodone-acetaminophen (NORCO) 5-325 MG per tablet Take 1-2 tablets by mouth every 6 (six) hours as needed. 12/17/14   Veryl Speak, MD  Insulin Glargine (LANTUS SOLOSTAR) 100 UNIT/ML Solostar Pen Inject 60 Units into  the skin daily at 10 pm. 06/01/14   [name], MD  insulin lispro (HUMALOG) 100 UNIT/ML KiwkPen Inject 0.08 mLs (8 Units total) into the skin 3 (three) times daily. 02/21/14   [name], MD  ISENTRESS 400 MG tablet TAKE 1 TABLET BY MOUTH TWICE DAILY. NEED TO MAKE APPOINTMENT 09/19/14   [name], MD  LORazepam (ATIVAN) 1 MG tablet Take 1 mg by mouth every 6 (six) hours as needed for anxiety.  06/19/14   Historical Provider, MD  NORVIR 100 MG TABS tablet TAKE 1 TABLET BY MOUTH DAILY WITH BREAKFAST 09/19/14   [name], MD  oxyCODONE-acetaminophen [facility]) 5-325 MG per tablet 1-2 tabs po q6 hours prn pain 08/21/14   Leanora Cover, MD  pravastatin  (PRAVACHOL) 40 MG tablet Take 1 tablet (40 mg total) by mouth daily. 01/03/14   [name], MD  PREZISTA 800 MG tablet TAKE 1 TABLET BY MOUTH DAILY WITH BREAKFAST 09/19/14   [name], MD  testosterone (ANDROGEL) 50 MG/5GM GEL Place 5 g onto the skin daily.     Historical Provider, MD  TRUVADA 200-300 MG per tablet TAKE 1 TABLET BY MOUTH DAILY 09/19/14   [name], MD   BP 119/85 mmHg  Pulse 85  Temp(Src) 98.1 F (36.7 C) (Oral)  Resp 18  SpO2 97% Physical Exam  Constitutional: He is oriented to person, place, and time. He appears well-developed and well-nourished. No distress.  HENT:  Head: Normocephalic and atraumatic.  Neck: Normal range of motion. Neck supple.  Neurological: He is alert and oriented to person, place, and time.  Skin: Skin is warm and dry. He is not diaphoretic.  There is swelling and tenderness adjacent to the nail of the index finger.  Nursing note and vitals reviewed.   ED Course  Procedures (including critical care time) Labs Review Labs Reviewed  BASIC METABOLIC PANEL - Abnormal; Notable for the following:    Sodium 129 (*)    Chloride 93 (*)    Glucose, Bld 470 (*)    All other components within normal limits  CBC WITH DIFFERENTIAL/PLATELET - Abnormal; Notable for the following:    Hemoglobin 12.7 (*)    HCT 38.0 (*)    All other components within normal limits    Imaging Review No results found. I have personally reviewed and evaluated these images and lab results as part of my medical decision-making.   EKG Interpretation None      MDM   Final diagnoses:  Paronychia, left   Paronychia drained.  Will treat with soaks, keflex, prn return.    Veryl Speak, MD 01/20/15 [id number]

## 2015-01-30 ENCOUNTER — Ambulatory Visit (INDEPENDENT_AMBULATORY_CARE_PROVIDER_SITE_OTHER): Payer: Medicare Other | Admitting: Infectious Diseases

## 2015-01-30 ENCOUNTER — Encounter: Payer: Self-pay | Admitting: Infectious Diseases

## 2015-01-30 VITALS — BP 140/94 | HR 89 | Temp 98.1°F | Ht 70.0 in | Wt 285.0 lb

## 2015-01-30 DIAGNOSIS — A630 Anogenital (venereal) warts: Secondary | ICD-10-CM

## 2015-01-30 DIAGNOSIS — Z79899 Other long term (current) drug therapy: Secondary | ICD-10-CM | POA: Diagnosis not present

## 2015-01-30 DIAGNOSIS — E1159 Type 2 diabetes mellitus with other circulatory complications: Secondary | ICD-10-CM | POA: Diagnosis not present

## 2015-01-30 DIAGNOSIS — Z23 Encounter for immunization: Secondary | ICD-10-CM | POA: Diagnosis not present

## 2015-01-30 DIAGNOSIS — B20 Human immunodeficiency virus [HIV] disease: Secondary | ICD-10-CM

## 2015-01-30 DIAGNOSIS — Z113 Encounter for screening for infections with a predominantly sexual mode of transmission: Secondary | ICD-10-CM | POA: Diagnosis not present

## 2015-01-30 DIAGNOSIS — N521 Erectile dysfunction due to diseases classified elsewhere: Secondary | ICD-10-CM | POA: Diagnosis not present

## 2015-01-30 MED ORDER — ELVITEG-COBIC-EMTRICIT-TENOFAF 150-150-200-10 MG PO TABS: 1.0000 | ORAL_TABLET | Freq: Every day | ORAL | Status: DC

## 2015-01-30 MED ORDER — IMIQUIMOD 5 % EX CREA: TOPICAL_CREAM | CUTANEOUS | Status: DC

## 2015-01-30 MED ORDER — LORAZEPAM 1 MG PO TABS: 1.0000 mg | ORAL_TABLET | Freq: Four times a day (QID) | ORAL | Status: DC | PRN

## 2015-01-30 NOTE — Assessment & Plan Note (Addendum)
Will consolidate his ART to genvoya/drv Given flu shot.  Continue dapsone Given condoms Will see him back in 4 months.

## 2015-01-30 NOTE — Assessment & Plan Note (Signed)
Will give him aldara He defers surgery eval

## 2015-01-30 NOTE — Progress Notes (Signed)
   Subjective:    [name], male    DOB: 1965/08/05, 49 y.o.   MRN: [name]  HPI 49 yo M with HIV/AIDS (since 1999), DM2. Prev ISN/DRVr/TRV. Dapsone added in hospital.  Has been seen in ED since last visit, soft tissue infections.   HIV 1 RNA QUANT (copies/mL)  Date Value  01/06/2015 <20  07/06/2014 5368*  04/19/2014 284*   CD4 T CELL ABS (/uL)  Date Value  01/06/2015 140*  07/06/2014 120*  02/08/2014 150*    Today asks for xanax. States having HIV is stressing him. States he had lorazepam but these did not help. Also asks for viagra.  Wart on penis.  Has been eating well, "can't loose weight" States his sugars are getting better.  Is on oxycodone 10mg  for his back.    Review of Systems  Constitutional: Negative for fever, chills, appetite change and unexpected weight change.  Cardiovascular: Negative for chest pain.  Gastrointestinal: Negative for diarrhea and constipation.  Genitourinary: Positive for genital sores. Negative for difficulty urinating.  Neurological: Negative for headaches.  Psychiatric/Behavioral: Negative for sleep disturbance. The patient is nervous/anxious.        Objective:   Physical Exam  Constitutional: He appears well-developed and well-nourished.  HENT:  Mouth/Throat: No oropharyngeal exudate.  Eyes: EOM are normal. Pupils are equal, round, and reactive to light.  Neck: Neck supple.  Cardiovascular: Normal rate, regular rhythm and normal heart sounds.   Pulmonary/Chest: Effort normal and breath sounds normal.  Abdominal: Soft. Bowel sounds are normal. There is no tenderness. There is no rebound.  Genitourinary:     Musculoskeletal:       Feet:  Lymphadenopathy:    He has no cervical adenopathy.  Neurological: No sensory deficit.      Assessment & Plan:

## 2015-01-30 NOTE — Assessment & Plan Note (Signed)
He wants a new doctor. Will send him to  [facility].

## 2015-03-02 ENCOUNTER — Emergency Department (HOSPITAL_COMMUNITY)
Admission: EM | Admit: 2015-03-02 | Discharge: 2015-03-03 | Disposition: A | Discharge status: Home / Self Care | Payer: Medicare Other | Attending: Emergency Medicine | Admitting: Emergency Medicine

## 2015-03-02 ENCOUNTER — Encounter (HOSPITAL_COMMUNITY): Payer: Self-pay | Admitting: Emergency Medicine

## 2015-03-02 DIAGNOSIS — I10 Essential (primary) hypertension: Secondary | ICD-10-CM | POA: Insufficient documentation

## 2015-03-02 DIAGNOSIS — E1165 Type 2 diabetes mellitus with hyperglycemia: Secondary | ICD-10-CM | POA: Insufficient documentation

## 2015-03-02 DIAGNOSIS — G8929 Other chronic pain: Secondary | ICD-10-CM | POA: Insufficient documentation

## 2015-03-02 DIAGNOSIS — Z792 Long term (current) use of antibiotics: Secondary | ICD-10-CM | POA: Diagnosis not present

## 2015-03-02 DIAGNOSIS — Z87438 Personal history of other diseases of male genital organs: Secondary | ICD-10-CM | POA: Diagnosis not present

## 2015-03-02 DIAGNOSIS — Z8739 Personal history of other diseases of the musculoskeletal system and connective tissue: Secondary | ICD-10-CM | POA: Diagnosis not present

## 2015-03-02 DIAGNOSIS — Z7982 Long term (current) use of aspirin: Secondary | ICD-10-CM | POA: Insufficient documentation

## 2015-03-02 DIAGNOSIS — R739 Hyperglycemia, unspecified: Secondary | ICD-10-CM

## 2015-03-02 DIAGNOSIS — B20 Human immunodeficiency virus [HIV] disease: Secondary | ICD-10-CM | POA: Insufficient documentation

## 2015-03-02 DIAGNOSIS — Z794 Long term (current) use of insulin: Secondary | ICD-10-CM | POA: Diagnosis not present

## 2015-03-02 DIAGNOSIS — Z79899 Other long term (current) drug therapy: Secondary | ICD-10-CM | POA: Insufficient documentation

## 2015-03-02 DIAGNOSIS — M79644 Pain in right finger(s): Secondary | ICD-10-CM | POA: Diagnosis present

## 2015-03-02 DIAGNOSIS — L03011 Cellulitis of right finger: Secondary | ICD-10-CM | POA: Insufficient documentation

## 2015-03-02 DIAGNOSIS — F419 Anxiety disorder, unspecified: Secondary | ICD-10-CM | POA: Insufficient documentation

## 2015-03-02 DIAGNOSIS — IMO0001 Reserved for inherently not codable concepts without codable children: Secondary | ICD-10-CM

## 2015-03-02 LAB — URINE MICROSCOPIC-ADD ON
Bacteria, UA: NONE SEEN
WBC, UA: NONE SEEN WBC/hpf (ref 0–5)

## 2015-03-02 LAB — CBC WITH DIFFERENTIAL/PLATELET
Basophils Absolute: 0 10*3/uL (ref 0.0–0.1)
Basophils Relative: 0 %
Eosinophils Absolute: 0 10*3/uL (ref 0.0–0.7)
Eosinophils Relative: 0 %
HCT: 40.8 % (ref 39.0–52.0)
Hemoglobin: 13.1 g/dL (ref 13.0–17.0)
Lymphocytes Relative: 26 %
Lymphs Abs: 3.4 10*3/uL (ref 0.7–4.0)
MCH: 26.8 pg (ref 26.0–34.0)
MCHC: 32.1 g/dL (ref 30.0–36.0)
MCV: 83.6 fL (ref 78.0–100.0)
Monocytes Absolute: 0.8 10*3/uL (ref 0.1–1.0)
Monocytes Relative: 6 %
Neutro Abs: 8.7 10*3/uL — ABNORMAL HIGH (ref 1.7–7.7)
Neutrophils Relative %: 68 %
Platelets: 241 10*3/uL (ref 150–400)
RBC: 4.88 MIL/uL (ref 4.22–5.81)
RDW: 12.7 % (ref 11.5–15.5)
WBC: 12.9 10*3/uL — ABNORMAL HIGH (ref 4.0–10.5)

## 2015-03-02 LAB — URINALYSIS, ROUTINE W REFLEX MICROSCOPIC
Bilirubin Urine: NEGATIVE
Glucose, UA: >1000 mg/dL — AB
Ketones, ur: NEGATIVE mg/dL
Leukocytes, UA: NEGATIVE
Nitrite: NEGATIVE
Protein, ur: NEGATIVE mg/dL
Specific Gravity, Urine: 1.037 — ABNORMAL HIGH (ref 1.005–1.030)
pH: 5 (ref 5.0–8.0)

## 2015-03-02 LAB — CBG MONITORING, ED: Glucose-Capillary: 503 mg/dL — ABNORMAL HIGH (ref 65–99)

## 2015-03-02 MED ORDER — SODIUM CHLORIDE 0.9 % IV BOLUS (SEPSIS)
1000.0000 mL | Freq: Once | INTRAVENOUS | Status: AC
  Administered 2015-03-02: 1000 mL via INTRAVENOUS

## 2015-03-02 MED ORDER — HYDROMORPHONE HCL 1 MG/ML IJ SOLN
1.5000 mg | Freq: Once | INTRAMUSCULAR | Status: AC
Start: 2015-03-02 — End: 2015-03-02
  Administered 2015-03-02: 1.5 mg via INTRAMUSCULAR
  Filled 2015-03-02: qty 2

## 2015-03-02 MED ORDER — LIDOCAINE-EPINEPHRINE (PF) 2 %-1:200000 IJ SOLN
20.0000 mL | Freq: Once | INTRAMUSCULAR | Status: AC
  Administered 2015-03-02: 20 mL via INTRADERMAL
  Filled 2015-03-02: qty 20

## 2015-03-02 NOTE — ED Notes (Signed)
Pt reports finger pain R index ongoing a week. Swelling noted. Pt reports he usually takes oxycodone 10, states he's out of it. Requesting refill.

## 2015-03-02 NOTE — ED Notes (Signed)
CBG--503

## 2015-03-02 NOTE — ED Provider Notes (Signed)
CSN: [id number]     Arrival date & time 03/02/15  2122 History  By signing my name below, I, Erling Conte, attest that this documentation has been prepared under the direction and in the presence of [name], PA-C Electronically Signed: Erling Conte, ED Scribe. 03/02/2015. 10:17 PM.    No chief complaint on file.   The history is provided by the patient and medical records. No language interpreter was used.    HPI Comments: Keith [name] is a 49 y.o. male with a h/o HIV, type II DM who presents to the Emergency Department complaining of constant, moderate, right index finger pain onset 1 week. He reports associated swelling the tip of his right index finger. He notes that he had a hangnail to that finger 1 week ago and he pulled the hangnail. Since that time he has had increased swelling and pain of his finger which started around his nail and has progressed to his entire finger.  He states he took his last oxycodone, 10mg  this morning with minimal relief.  Pt was admitted for a felon in May 2016, 6 months ago, and states the pain is worse at this time.  Per pt's chart review, his last CD4 count was 140 on 01/06/15. He denies any other concerns at this time.   Past Medical History  Diagnosis Date  . HIV (human immunodeficiency virus infection) ([place]) 2009    CD4 count 100, VL 13800 (05/01/2010)  . Diabetes type 2, uncontrolled ([place])     HgA1c 17.6 (04/27/2010)  . Hypertension   . Genital warts   . Erectile dysfunction   . Chronic knee pain     right  . Osteomyelitis ([place])     h/o hand  . Diabetes mellitus   . Chronic pain   . AIDS [facility])    Past Surgical History  Procedure Laterality Date  . Multiple extractions with alveoloplasty N/A 01/18/2013    Procedure: MULTIPLE EXTRACION 3, 6, 7, 10, 11, 13, 21, 22, 27, 28, 29, 30 WITH ALVEOLOPLASTY;  Surgeon: Gae Bon, DDS;  Location: [place];  Service: Oral Surgery;  Laterality: N/A;  . Hernia repair    . Minor irrigation and  debridement of wound Right 04/22/2014    Procedure: IRRIGATION AND DEBRIDEMENT OF RIGHT NECK ABCESS;  Surgeon: Jerrell Belfast, MD;  Location: [facility] OR;  Service: ENT;  Laterality: Right;  . I&d extremity Left 08/21/2014    Procedure: INCISION AND DRAINAGE LEFT SMALL FINGER;  Surgeon: Leanora Cover, MD;  Location: [place];  Service: Orthopedics;  Laterality: Left;   Family History  Problem Relation Age of Onset  . Hypertension Mother   . Arthritis Father   . Hypertension Father   . Hypertension Brother   . Cancer Maternal Grandmother 67    unknown type of cancer  . Depression Paternal Grandmother    Social History  Substance Use Topics  . Smoking status: Never Smoker   . Smokeless tobacco: Never Used  . Alcohol Use: No    Review of Systems  Constitutional: Negative for fever, chills and activity change.  HENT: Negative.   Eyes: Negative.   Respiratory: Negative.   Cardiovascular: Negative.   Gastrointestinal: Negative for nausea and vomiting.  Genitourinary: Negative.   Musculoskeletal: Positive for joint swelling and arthralgias.  Skin: Negative.   Neurological: Negative for weakness and numbness.      Allergies  Sulfa antibiotics  Home Medications   Prior to Admission medications   Medication Sig Start Date  End Date Taking? Authorizing Provider  aspirin 81 MG EC tablet Take 81 mg by mouth daily.  08/20/11   [name], MD  cephALEXin (KEFLEX) 500 MG capsule Take 1 capsule (500 mg total) by mouth 4 (four) times daily. 12/17/14   Veryl Speak, MD  dapsone 100 MG tablet TAKE 1 TABLET BY MOUTH EVERY DAY. NEED APPOINTMENT 09/19/14   [name], MD  doxycycline (VIBRAMYCIN) 50 MG capsule Take 2 capsules (100 mg total) by mouth 2 (two) times daily. 08/21/14   Leanora Cover, MD  elvitegravir-cobicistat-emtricitabine-tenofovir (GENVOYA) 150-150-200-10 MG TABS tablet Take 1 tablet by mouth daily with breakfast. 01/30/15   [name], MD  HYDROcodone-acetaminophen  [facility]) 5-325 MG per tablet Take 1-2 tablets by mouth every 6 (six) hours as needed. 12/17/14   Veryl Speak, MD  imiquimod [facility]) 5 % cream Apply topically 3 (three) times a week. 01/30/15   [name], MD  Insulin Glargine (LANTUS SOLOSTAR) 100 UNIT/ML Solostar Pen Inject 60 Units into the skin daily at 10 pm. 06/01/14   [name], MD  insulin lispro (HUMALOG) 100 UNIT/ML KiwkPen Inject 0.08 mLs (8 Units total) into the skin 3 (three) times daily. 02/21/14   [name], MD  LORazepam (ATIVAN) 1 MG tablet Take 1 tablet (1 mg total) by mouth every 6 (six) hours as needed for anxiety. 01/30/15   [name], MD  oxyCODONE-acetaminophen [facility]) 5-325 MG per tablet 1-2 tabs po q6 hours prn pain 08/21/14   Leanora Cover, MD  pravastatin (PRAVACHOL) 40 MG tablet Take 1 tablet (40 mg total) by mouth daily. 01/03/14   [name], MD  PREZISTA 800 MG tablet TAKE 1 TABLET BY MOUTH DAILY WITH BREAKFAST 09/19/14   [name], MD  testosterone (ANDROGEL) 50 MG/5GM GEL Place 5 g onto the skin daily.     Historical Provider, MD   Triage Vitals: Pulse 109  Temp(Src) 98.8 F (37.1 C) (Oral)  Resp 16  SpO2 100%  Physical Exam  Constitutional: He is oriented to person, place, and time. He appears well-developed and well-nourished. No distress.  HENT:  Head: Normocephalic and atraumatic.  Right Ear: External ear normal.  Left Ear: External ear normal.  Nose: Nose normal.  Mouth/Throat: Oropharynx is clear and moist. No oropharyngeal exudate.  Oral mucosa dry  Eyes: Conjunctivae and EOM are normal. Pupils are equal, round, and reactive to light. Right eye exhibits no discharge. Left eye exhibits no discharge. No scleral icterus.  Neck: Normal range of motion. Neck supple. No JVD present. No tracheal deviation present.  Cardiovascular: Normal rate, regular rhythm, normal heart sounds and intact distal pulses.  Exam reveals no gallop and no friction rub.   No murmur  heard. Pulmonary/Chest: Effort normal and breath sounds normal. No stridor. No respiratory distress.  Abdominal: Soft. Bowel sounds are normal. He exhibits no distension.  Musculoskeletal: Normal range of motion. He exhibits edema and tenderness.  Lymphadenopathy:    He has no cervical adenopathy.  Neurological: He is alert and oriented to person, place, and time. He displays no tremor. No sensory deficit. He exhibits normal muscle tone. Coordination and gait normal.  Skin: Skin is warm and dry. No rash noted. He is not diaphoretic. No cyanosis or erythema. No pallor. Nails show no clubbing.  Swelling to his right index finger, more prominent in the distal phalanx with fluctuance surrounding the nail fold.  Tenderness to palpation along the entire finger, range of motion limited by pain  See picture  Psychiatric: His behavior is normal. Judgment and thought content normal. His mood appears anxious.  Nursing note and vitals reviewed.     ED Course  Procedures (including critical care time)  DIAGNOSTIC STUDIES: Oxygen Saturation is 100% on RA, normal by my interpretation.    COORDINATION OF CARE:  NERVE BLOCK Performed by: [name] Consent: Verbal consent obtained. Required items: required blood products, implants, devices, and special equipment available Time out: Immediately prior to procedure a "time out" was called to verify the correct patient, procedure, equipment, support staff and site/side marked as required. Indication: paronychia Nerve block body site: right index finger Preparation: Patient was prepped and draped in the usual sterile fashion. Needle gauge: 24 G Location technique: anatomical landmarks Local anesthetic: 2% lido Anesthetic total: 8 ml Outcome: pain improved Patient tolerance: Patient tolerated the procedure well with no immediate complications.  11:04 PM  INCISION AND DRAINAGE Performed by: [name] Consent: Verbal consent obtained. Risks and  benefits: risks, benefits and alternatives were discussed Time out performed prior to procedure Type: paronychia Body area: right index finger Anesthesia: digital block, see above Incision was made with a scalpel by incising alone proximal nail fold, opening with tweezers and hemostat Complexity: complex Blunt dissection to break up loculations Drainage: purulent Drainage amount: moderate Packing material: none Patient tolerance: Patient tolerated the procedure well with no immediate complications.   Labs Review Labs Reviewed - No data to display  Imaging Review No results found. I have personally reviewed and evaluated these images and lab results as part of my medical decision-making.   EKG Interpretation None      MDM   Final diagnoses:  None   Pt with paronychia, with pain and swelling to right index finger, hx of paronychia and felon with HIV with CD4 count 140, and poorly controlled DM.  The patient was presented to Dr. Stark Jock early and clinical course due to the patient's history. His exam was consistent with paronychia however there is a much swelling that patient would be reevaluated after I&D for remaining swelling and induration of his finger.  The patient was blocked locally with a digital block with good anesthesia. He tolerated the I&D without any pain or difficulty. There was a moderate amount of purulent discharge. The distal finger pad was soft without induration, did not appear to have a felon.  The patient was kept nothing by mouth until this point, and had some basic labs drawn and fluids given for an initial CBG of 503.  The patient states that he did not take his home insulin today and felt mildly dehydrated but otherwise does not have any symptoms.  He was given a liter bolus which did not drop sugars very much, 481.  He was also given 10 units of insulin, and sugars improved to 421.  The patient's basic labs are pertinent for a mild leukocytosis of 12.9, he  otherwise did not have an anion gap or ketonuria.    He received his first dose of Keflex while in the ER, was given thorough wound care instructions.  He requested to go home and take his home medication for his blood sugar, and was given return precautions.    He requested refills of his narcotic pain medications several times.  He was given a few tramadol, but was instructed to follow-up with his regular doctor for refills of prescriptions pain medication.  He is discharged in stable condition with normal vital signs.  Filed Vitals:  03/03/15 0032 03/03/15 0151  BP: 123/86 119/85  Pulse: 98 95  Temp:    Resp: 16 16     I personally performed the services described in this documentation, which was scribed in my presence. The recorded information has been reviewed and is accurate.       [name], PA-C 03/03/15 [id number]  Veryl Speak, MD 03/06/15 1034

## 2015-03-02 NOTE — ED Notes (Signed)
CBG 

## 2015-03-03 DIAGNOSIS — L03011 Cellulitis of right finger: Secondary | ICD-10-CM | POA: Diagnosis not present

## 2015-03-03 LAB — BASIC METABOLIC PANEL
Anion gap: 10 (ref 5–15)
BUN: 12 mg/dL (ref 6–20)
CO2: 25 mmol/L (ref 22–32)
Calcium: 9.5 mg/dL (ref 8.9–10.3)
Chloride: 94 mmol/L — ABNORMAL LOW (ref 101–111)
Creatinine, Ser: 1.18 mg/dL (ref 0.61–1.24)
GFR calc Af Amer: >60 mL/min (ref >60)
GFR calc non Af Amer: >60 mL/min (ref >60)
Glucose, Bld: 501 mg/dL — ABNORMAL HIGH (ref 65–99)
Potassium: 4 mmol/L (ref 3.5–5.1)
Sodium: 129 mmol/L — ABNORMAL LOW (ref 135–145)

## 2015-03-03 LAB — CBG MONITORING, ED
Glucose-Capillary: 424 mg/dL — ABNORMAL HIGH (ref 65–99)
Glucose-Capillary: 481 mg/dL — ABNORMAL HIGH (ref 65–99)

## 2015-03-03 MED ORDER — CEPHALEXIN 500 MG PO CAPS: 500.0000 mg | ORAL_CAPSULE | Freq: Four times a day (QID) | ORAL | Status: DC

## 2015-03-03 MED ORDER — TRAMADOL HCL 50 MG PO TABS: 50.0000 mg | ORAL_TABLET | Freq: Four times a day (QID) | ORAL | Status: DC | PRN

## 2015-03-03 MED ORDER — CEPHALEXIN 250 MG PO CAPS
500.0000 mg | ORAL_CAPSULE | Freq: Once | ORAL | Status: AC
  Administered 2015-03-03: 500 mg via ORAL
  Filled 2015-03-03: qty 2

## 2015-03-03 MED ORDER — INSULIN ASPART 100 UNIT/ML ~~LOC~~ SOLN
10.0000 [IU] | Freq: Once | SUBCUTANEOUS | Status: AC
  Administered 2015-03-03: 10 [IU] via SUBCUTANEOUS
  Filled 2015-03-03: qty 1

## 2015-03-03 NOTE — ED Notes (Addendum)
CBG 424 

## 2015-03-03 NOTE — ED Notes (Signed)
CBG 481 

## 2015-03-03 NOTE — ED Notes (Signed)
Patient left at this time with all belongings. 

## 2015-03-03 NOTE — Discharge Instructions (Signed)
Blood Glucose Monitoring, Adult Monitoring your blood glucose (also know as blood sugar) helps you to manage your diabetes. It also helps you and your health care provider monitor your diabetes and determine how well your treatment plan is working. WHY SHOULD YOU MONITOR YOUR BLOOD GLUCOSE?  It can help you understand how food, exercise, and medicine affect your blood glucose.  It allows you to know what your blood glucose is at any given moment. You can quickly tell if you are having low blood glucose (hypoglycemia) or high blood glucose (hyperglycemia).  It can help you and your health care provider know how to adjust your medicines.  It can help you understand how to manage an illness or adjust medicine for exercise. WHEN SHOULD YOU TEST? Your health care provider will help you decide how often you should check your blood glucose. This may depend on the type of diabetes you have, your diabetes control, or the types of medicines you are taking. Be sure to write down all of your blood glucose readings so that this information can be reviewed with your health care provider. See below for examples of testing times that your health care provider may suggest. Type 1 Diabetes  Test at least 2 times per day if your diabetes is well controlled, if you are using an insulin pump, or if you perform multiple daily injections.  If your diabetes is not well controlled or if you are sick, you may need to test more often.  It is a good idea to also test:  Before every insulin injection.  Before and after exercise.  Between meals and 2 hours after a meal.  Occasionally between 2:00 a.m. and 3:00 a.m. Type 2 Diabetes  If you are taking insulin, test at least 2 times per day. However, it is best to test before every insulin injection.  If you take medicines by mouth (orally), test 2 times a day.  If you are on a controlled diet, test once a day.  If your diabetes is not well controlled or if you  are sick, you may need to monitor more often. HOW TO MONITOR YOUR BLOOD GLUCOSE Supplies Needed  Blood glucose meter.  Test strips for your meter. Each meter has its own strips. You must use the strips that go with your own meter.  A pricking needle (lancet).  A device that holds the lancet (lancing device).  A journal or log book to write down your results. Procedure  Wash your hands with soap and water. Alcohol is not preferred.  Prick the side of your finger (not the tip) with the lancet.  Gently milk the finger until a small drop of blood appears.  Follow the instructions that come with your meter for inserting the test strip, applying blood to the strip, and using your blood glucose meter. Other Areas to Get Blood for Testing Some meters allow you to use other areas of your body (other than your finger) to test your blood. These areas are called alternative sites. The most common alternative sites are:  The forearm.  The thigh.  The back area of the lower leg.  The palm of the hand. The blood flow in these areas is slower. Therefore, the blood glucose values you get may be delayed, and the numbers are different from what you would get from your fingers. Do not use alternative sites if you think you are having hypoglycemia. Your reading will not be accurate. Always use a finger if you are  having hypoglycemia. Also, if you cannot feel your lows (hypoglycemia unawareness), always use your fingers for your blood glucose checks. ADDITIONAL TIPS FOR GLUCOSE MONITORING  Do not reuse lancets.  Always carry your supplies with you.  All blood glucose meters have a 24-hour "hotline" number to call if you have questions or need help.  Adjust (calibrate) your blood glucose meter with a control solution after finishing a few boxes of strips. BLOOD GLUCOSE RECORD KEEPING It is a good idea to keep a daily record or log of your blood glucose readings. Most glucose meters, if not all,  keep your glucose records stored in the meter. Some meters come with the ability to download your records to your home computer. Keeping a record of your blood glucose readings is especially helpful if you are wanting to look for patterns. Make notes to go along with the blood glucose readings because you might forget what happened at that exact time. Keeping good records helps you and your health care provider to work together to achieve good diabetes management.    This information is not intended to replace advice given to you by your health care provider. Make sure you discuss any questions you have with your health care provider.   Document Released: 03/28/2003 Document Revised: 04/15/2014 Document Reviewed: 08/17/2012 Elsevier Interactive Patient Education 2016 Elsevier Inc[employer]  Cellulitis Cellulitis is an infection of the skin and the tissue beneath it. The infected area is usually red and tender. Cellulitis occurs most often in the arms and lower legs.  CAUSES  Cellulitis is caused by bacteria that enter the skin through cracks or cuts in the skin. The most common types of bacteria that cause cellulitis are staphylococci and streptococci. SIGNS AND SYMPTOMS   Redness and warmth.  Swelling.  Tenderness or pain.  Fever. DIAGNOSIS  Your health care provider can usually determine what is wrong based on a physical exam. Blood tests may also be done. TREATMENT  Treatment usually involves taking an antibiotic medicine. HOME CARE INSTRUCTIONS   Take your antibiotic medicine as directed by your health care provider. Finish the antibiotic even if you start to feel better.  Keep the infected arm or leg elevated to reduce swelling.  Apply a warm cloth to the affected area up to 4 times per day to relieve pain.  Take medicines only as directed by your health care provider.  Keep all follow-up visits as directed by your health care provider. SEEK MEDICAL CARE IF:   You notice red streaks  coming from the infected area.  Your red area gets larger or turns dark in color.  Your bone or joint underneath the infected area becomes painful after the skin has healed.  Your infection returns in the same area or another area.  You notice a swollen bump in the infected area.  You develop new symptoms.  You have a fever. SEEK IMMEDIATE MEDICAL CARE IF:   You feel very sleepy.  You develop vomiting or diarrhea.  You have a general ill feeling (malaise) with muscle aches and pains.   This information is not intended to replace advice given to you by your health care provider. Make sure you discuss any questions you have with your health care provider.   Document Released: 01/02/2005 Document Revised: 12/14/2014 Document Reviewed: 06/10/2011 Elsevier Interactive Patient Education 2016 [place] is an infection of the skin that surrounds a nail. It usually affects the skin around a fingernail, but it may also occur  near a toenail. It often causes pain and swelling around the nail. This condition may come on suddenly or develop over a longer period. In some cases, a collection of pus (abscess) can form near or under the nail. Usually, paronychia is not serious and it clears up with treatment. CAUSES This condition may be caused by bacteria or fungi. It is commonly caused by either Streptococcus or Staphylococcus bacteria. The bacteria or fungi often cause the infection by getting into the affected area through an opening in the skin, such as a cut or a hangnail. RISK FACTORS This condition is more likely to develop in:  People who get their hands wet often, such as those who work as Designer, industrial/product, bartenders, or nurses.  People who bite their fingernails or suck their thumbs.  People who trim their nails too short.  People who have hangnails or injured fingertips.  People who get manicures.  People who have diabetes. SYMPTOMS Symptoms of this  condition include:  Redness and swelling of the skin near the nail.  Tenderness around the nail when you touch the area.  Pus-filled bumps under the cuticle. The cuticle is the skin at the base or sides of the nail.  Fluid or pus under the nail.  Throbbing pain in the area. DIAGNOSIS This condition is usually diagnosed with a physical exam. In some cases, a sample of pus may be taken from an abscess to be tested in a lab. This can help to determine what type of bacteria or fungi is causing the condition. TREATMENT Treatment for this condition depends on the cause and severity of the condition. If the condition is mild, it may clear up on its own in a few days. Your health care provider may recommend soaking the affected area in warm water a few times a day. When treatment is needed, the options may include:  Antibiotic medicine, if the condition is caused by a bacterial infection.  Antifungal medicine, if the condition is caused by a fungal infection.  Incision and drainage, if an abscess is present. In this procedure, the health care provider will cut open the abscess so the pus can drain out. HOME CARE INSTRUCTIONS  Soak the affected area in warm water if directed to do so by your health care provider. You may be told to do this for 20 minutes, 2-3 times a day. Keep the area dry in between soakings.  Take medicines only as directed by your health care provider.  If you were prescribed an antibiotic medicine, finish all of it even if you start to feel better.  Keep the affected area clean.  Do not try to drain a fluid-filled bump yourself.  If you will be washing dishes or performing other tasks that require your hands to get wet, wear rubber gloves. You should also wear gloves if your hands might come in contact with irritating substances, such as cleaners or chemicals.  Follow your health care provider's instructions about:  Wound care.  Bandage (dressing) changes and  removal. SEEK MEDICAL CARE IF:  Your symptoms get worse or do not improve with treatment.  You have a fever or chills.  You have redness spreading from the affected area.  You have continued or increased fluid, blood, or pus coming from the affected area.  Your finger or knuckle becomes swollen or is difficult to move.   This information is not intended to replace advice given to you by your health care provider. Make sure you discuss any questions  you have with your health care provider.   Document Released: 09/18/2000 Document Revised: 08/09/2014 Document Reviewed: 03/02/2014 Elsevier Interactive Patient Education 2016 Elsevier Inc.  Hyperglycemia High blood sugar (hyperglycemia) means that the level of sugar in your blood is higher than it should be. Signs of high blood sugar include:  Feeling thirsty.  Frequent peeing (urinating).  Feeling tired or sleepy.  Dry mouth.  Vision changes.  Feeling weak.  Feeling hungry but losing weight.  Numbness and tingling in your hands or feet.  Headache. When you ignore these signs, your blood sugar may keep going up. These problems may get worse, and other problems may begin. HOME CARE  Check your blood sugars as told by your doctor. Write down the numbers with the date and time.  Take the right amount of insulin or diabetes pills at the right time. Write down the dose with date and time.  Refill your insulin or diabetes pills before running out.  Watch what you eat. Follow your meal plan.  Drink liquids without sugar, such as water. Check with your doctor if you have kidney or heart disease.  Follow your doctor's orders for exercise. Exercise at the same time of day.  Keep your doctor's appointments. GET HELP RIGHT AWAY IF:   You have trouble thinking or are confused.  You have fast breathing with fruity smelling breath.  You pass out (faint).  You have 2 to 3 days of high blood sugars and you do not know  why.  You have chest pain.  You are feeling sick to your stomach (nauseous) or throwing up (vomiting).  You have sudden vision changes. MAKE SURE YOU:   Understand these instructions.  Will watch your condition.  Will get help right away if you are not doing well or get worse.   This information is not intended to replace advice given to you by your health care provider. Make sure you discuss any questions you have with your health care provider.   Document Released: 01/20/2009 Document Revised: 04/15/2014 Document Reviewed: 11/29/2014 Elsevier Interactive Patient Education [employer].

## 2015-03-08 ENCOUNTER — Encounter (HOSPITAL_COMMUNITY): Payer: Self-pay

## 2015-03-08 ENCOUNTER — Inpatient Hospital Stay (HOSPITAL_COMMUNITY)
Admission: EM | Admit: 2015-03-08 | Discharge: 2015-03-09 | DRG: 602 | Disposition: A | Discharge status: Home / Self Care | Payer: Medicare Other | Attending: Internal Medicine | Admitting: Internal Medicine

## 2015-03-08 ENCOUNTER — Observation Stay (HOSPITAL_COMMUNITY): Payer: Medicare Other

## 2015-03-08 DIAGNOSIS — Z79899 Other long term (current) drug therapy: Secondary | ICD-10-CM | POA: Diagnosis not present

## 2015-03-08 DIAGNOSIS — M25561 Pain in right knee: Secondary | ICD-10-CM | POA: Diagnosis present

## 2015-03-08 DIAGNOSIS — M79644 Pain in right finger(s): Secondary | ICD-10-CM | POA: Diagnosis present

## 2015-03-08 DIAGNOSIS — B20 Human immunodeficiency virus [HIV] disease: Secondary | ICD-10-CM | POA: Diagnosis present

## 2015-03-08 DIAGNOSIS — I10 Essential (primary) hypertension: Secondary | ICD-10-CM | POA: Diagnosis present

## 2015-03-08 DIAGNOSIS — E1100 Type 2 diabetes mellitus with hyperosmolarity without nonketotic hyperglycemic-hyperosmolar coma (NKHHC): Secondary | ICD-10-CM | POA: Diagnosis present

## 2015-03-08 DIAGNOSIS — G8929 Other chronic pain: Secondary | ICD-10-CM | POA: Diagnosis present

## 2015-03-08 DIAGNOSIS — E1165 Type 2 diabetes mellitus with hyperglycemia: Secondary | ICD-10-CM | POA: Diagnosis present

## 2015-03-08 DIAGNOSIS — Z794 Long term (current) use of insulin: Secondary | ICD-10-CM

## 2015-03-08 DIAGNOSIS — E1101 Type 2 diabetes mellitus with hyperosmolarity with coma: Secondary | ICD-10-CM | POA: Diagnosis not present

## 2015-03-08 DIAGNOSIS — L02511 Cutaneous abscess of right hand: Secondary | ICD-10-CM | POA: Diagnosis not present

## 2015-03-08 DIAGNOSIS — Z9114 Patient's other noncompliance with medication regimen: Secondary | ICD-10-CM

## 2015-03-08 DIAGNOSIS — Z7982 Long term (current) use of aspirin: Secondary | ICD-10-CM

## 2015-03-08 DIAGNOSIS — R739 Hyperglycemia, unspecified: Secondary | ICD-10-CM

## 2015-03-08 LAB — BASIC METABOLIC PANEL
Anion gap: 11 (ref 5–15)
BUN: 11 mg/dL (ref 6–20)
CO2: 25 mmol/L (ref 22–32)
Calcium: 9.8 mg/dL (ref 8.9–10.3)
Chloride: 86 mmol/L — ABNORMAL LOW (ref 101–111)
Creatinine, Ser: 1.13 mg/dL (ref 0.61–1.24)
GFR calc Af Amer: >60 mL/min (ref >60)
GFR calc non Af Amer: >60 mL/min (ref >60)
Glucose, Bld: 789 mg/dL (ref 65–99)
Potassium: 4.8 mmol/L (ref 3.5–5.1)
Sodium: 122 mmol/L — ABNORMAL LOW (ref 135–145)

## 2015-03-08 LAB — URINE MICROSCOPIC-ADD ON: Bacteria, UA: NONE SEEN

## 2015-03-08 LAB — URINALYSIS, ROUTINE W REFLEX MICROSCOPIC
Bilirubin Urine: NEGATIVE
Glucose, UA: >1000 mg/dL — AB
Ketones, ur: NEGATIVE mg/dL
Leukocytes, UA: NEGATIVE
Nitrite: NEGATIVE
Protein, ur: NEGATIVE mg/dL
Specific Gravity, Urine: 1.033 — ABNORMAL HIGH (ref 1.005–1.030)
pH: 5 (ref 5.0–8.0)

## 2015-03-08 LAB — CBC WITH DIFFERENTIAL/PLATELET
Basophils Absolute: 0 10*3/uL (ref 0.0–0.1)
Basophils Relative: 0 %
Eosinophils Absolute: 0.1 10*3/uL (ref 0.0–0.7)
Eosinophils Relative: 1 %
HCT: 39.1 % (ref 39.0–52.0)
Hemoglobin: 13.3 g/dL (ref 13.0–17.0)
Lymphocytes Relative: 32 %
Lymphs Abs: 1.7 10*3/uL (ref 0.7–4.0)
MCH: 27.6 pg (ref 26.0–34.0)
MCHC: 34 g/dL (ref 30.0–36.0)
MCV: 81.1 fL (ref 78.0–100.0)
Monocytes Absolute: 0.3 10*3/uL (ref 0.1–1.0)
Monocytes Relative: 6 %
Neutro Abs: 3.3 10*3/uL (ref 1.7–7.7)
Neutrophils Relative %: 61 %
Platelets: 298 10*3/uL (ref 150–400)
RBC: 4.82 MIL/uL (ref 4.22–5.81)
RDW: 12.3 % (ref 11.5–15.5)
WBC: 5.4 10*3/uL (ref 4.0–10.5)

## 2015-03-08 LAB — I-STAT CHEM 8, ED
BUN: 14 mg/dL (ref 6–20)
Calcium, Ion: 1.17 mmol/L (ref 1.12–1.23)
Chloride: 90 mmol/L — ABNORMAL LOW (ref 101–111)
Creatinine, Ser: 0.9 mg/dL (ref 0.61–1.24)
Glucose, Bld: >700 mg/dL (ref 65–99)
HCT: 47 % (ref 39.0–52.0)
Hemoglobin: 16 g/dL (ref 13.0–17.0)
Potassium: 4.8 mmol/L (ref 3.5–5.1)
Sodium: 124 mmol/L — ABNORMAL LOW (ref 135–145)
TCO2: 23 mmol/L (ref 0–100)

## 2015-03-08 LAB — CBG MONITORING, ED
Glucose-Capillary: >600 mg/dL (ref 65–99)
Glucose-Capillary: >600 mg/dL (ref 65–99)

## 2015-03-08 LAB — GLUCOSE, CAPILLARY: Glucose-Capillary: 242 mg/dL — ABNORMAL HIGH (ref 65–99)

## 2015-03-08 MED ORDER — INSULIN GLARGINE 100 UNIT/ML ~~LOC~~ SOLN
60.0000 [IU] | Freq: Every day | SUBCUTANEOUS | Status: DC
  Administered 2015-03-09: 60 [IU] via SUBCUTANEOUS
  Filled 2015-03-08 (×2): qty 0.6

## 2015-03-08 MED ORDER — SODIUM CHLORIDE 0.9 % IV BOLUS (SEPSIS)
1000.0000 mL | Freq: Once | INTRAVENOUS | Status: AC
  Administered 2015-03-08: 1000 mL via INTRAVENOUS

## 2015-03-08 MED ORDER — VANCOMYCIN HCL 10 G IV SOLR
2000.0000 mg | Freq: Once | INTRAVENOUS | Status: AC
  Administered 2015-03-09: 2000 mg via INTRAVENOUS
  Filled 2015-03-08: qty 2000

## 2015-03-08 MED ORDER — LIDOCAINE HCL (PF) 1 % IJ SOLN
30.0000 mL | Freq: Once | INTRAMUSCULAR | Status: DC
  Filled 2015-03-08: qty 30

## 2015-03-08 MED ORDER — INSULIN ASPART 100 UNIT/ML ~~LOC~~ SOLN
20.0000 [IU] | Freq: Once | SUBCUTANEOUS | Status: AC
  Administered 2015-03-08: 20 [IU] via SUBCUTANEOUS
  Filled 2015-03-08: qty 1

## 2015-03-08 MED ORDER — VANCOMYCIN HCL 10 G IV SOLR
1250.0000 mg | Freq: Three times a day (TID) | INTRAVENOUS | Status: DC
  Administered 2015-03-09: 1250 mg via INTRAVENOUS
  Filled 2015-03-08 (×2): qty 1250

## 2015-03-08 MED ORDER — DARUNAVIR ETHANOLATE 800 MG PO TABS
800.0000 mg | ORAL_TABLET | Freq: Every day | ORAL | Status: DC
  Administered 2015-03-09: 800 mg via ORAL
  Filled 2015-03-08: qty 1

## 2015-03-08 MED ORDER — MORPHINE SULFATE (PF) 4 MG/ML IV SOLN
2.0000 mg | Freq: Once | INTRAVENOUS | Status: AC
  Administered 2015-03-08: 2 mg via INTRAVENOUS
  Filled 2015-03-08: qty 1

## 2015-03-08 MED ORDER — INSULIN GLARGINE 100 UNIT/ML SOLOSTAR PEN: 60.0000 [IU] | PEN_INJECTOR | Freq: Every day | SUBCUTANEOUS | Status: DC

## 2015-03-08 MED ORDER — SODIUM CHLORIDE 0.9 % IV SOLN: INTRAVENOUS | Status: DC

## 2015-03-08 MED ORDER — CLINDAMYCIN PHOSPHATE 600 MG/50ML IV SOLN
600.0000 mg | Freq: Once | INTRAVENOUS | Status: AC
  Administered 2015-03-08: 600 mg via INTRAVENOUS
  Filled 2015-03-08: qty 50

## 2015-03-08 MED ORDER — TRAMADOL HCL 50 MG PO TABS: 50.0000 mg | ORAL_TABLET | Freq: Four times a day (QID) | ORAL | Status: DC | PRN

## 2015-03-08 MED ORDER — ONDANSETRON HCL 4 MG PO TABS: 4.0000 mg | ORAL_TABLET | Freq: Four times a day (QID) | ORAL | Status: DC | PRN

## 2015-03-08 MED ORDER — ACETAMINOPHEN 650 MG RE SUPP: 650.0000 mg | Freq: Four times a day (QID) | RECTAL | Status: DC | PRN

## 2015-03-08 MED ORDER — ONDANSETRON HCL 4 MG/2ML IJ SOLN: 4.0000 mg | Freq: Four times a day (QID) | INTRAMUSCULAR | Status: DC | PRN

## 2015-03-08 MED ORDER — MORPHINE SULFATE (PF) 2 MG/ML IV SOLN
2.0000 mg | INTRAVENOUS | Status: DC | PRN
  Administered 2015-03-09 (×2): 2 mg via INTRAVENOUS
  Filled 2015-03-08 (×2): qty 1

## 2015-03-08 MED ORDER — PRAVASTATIN SODIUM 40 MG PO TABS: 40.0000 mg | ORAL_TABLET | Freq: Every day | ORAL | Status: DC

## 2015-03-08 MED ORDER — ONDANSETRON HCL 4 MG/2ML IJ SOLN: 4.0000 mg | Freq: Three times a day (TID) | INTRAMUSCULAR | Status: DC | PRN

## 2015-03-08 MED ORDER — ACETAMINOPHEN 325 MG PO TABS: 650.0000 mg | ORAL_TABLET | Freq: Four times a day (QID) | ORAL | Status: DC | PRN

## 2015-03-08 MED ORDER — IMIQUIMOD 5 % EX CREA: TOPICAL_CREAM | CUTANEOUS | Status: DC

## 2015-03-08 MED ORDER — INSULIN ASPART 100 UNIT/ML ~~LOC~~ SOLN
0.0000 [IU] | Freq: Three times a day (TID) | SUBCUTANEOUS | Status: DC
  Administered 2015-03-09: 11 [IU] via SUBCUTANEOUS

## 2015-03-08 MED ORDER — TRAMADOL HCL 50 MG PO TABS
50.0000 mg | ORAL_TABLET | Freq: Once | ORAL | Status: AC
  Administered 2015-03-08: 50 mg via ORAL
  Filled 2015-03-08: qty 1

## 2015-03-08 MED ORDER — DAPSONE 100 MG PO TABS
100.0000 mg | ORAL_TABLET | Freq: Every day | ORAL | Status: DC
  Administered 2015-03-09: 100 mg via ORAL
  Filled 2015-03-08: qty 1

## 2015-03-08 MED ORDER — LORAZEPAM 1 MG PO TABS: 1.0000 mg | ORAL_TABLET | Freq: Four times a day (QID) | ORAL | Status: DC | PRN

## 2015-03-08 MED ORDER — SODIUM CHLORIDE 0.9 % IV SOLN
INTRAVENOUS | Status: DC
  Administered 2015-03-09 (×2): via INTRAVENOUS

## 2015-03-08 MED ORDER — ELVITEG-COBIC-EMTRICIT-TENOFAF 150-150-200-10 MG PO TABS
1.0000 | ORAL_TABLET | Freq: Every day | ORAL | Status: DC
  Administered 2015-03-09: 1 via ORAL
  Filled 2015-03-08 (×2): qty 1

## 2015-03-08 NOTE — ED Notes (Signed)
  CBG Reads HI >600

## 2015-03-08 NOTE — H&P (Signed)
[facility] History and Physical  Keith [name] [id number] DOB: 09-05-1965 DOA: 03/08/2015  Referring physician: Ms.Rose. PCP: No PCP Per Patient patient follows up with her primary care in [facility]. Specialists: Dr. [name]. Infectious disease.  Chief Complaint: Right index finger pain and swelling.  HPI: Keith [name] is a 49 y.o. male with history of diabetes mellitus type 2, HIV/AIDS presents to the ER because of increasing pain in his right index finger. Patient had come to the ER a week ago and patient's abscess in the right index finger was incised and drained and patient was given pain relief medications and antibiotics. Patient states he has been taking these despite which patient's finger has become more swollen and painful. In the ER patient had incision and drain the right index finger abscess which is mostly in the distal phalanx. Patient's blood sugars found to be more than 700 and patient states he has been compliant with his medications. Patient takes Lantus at bedtime. Patient was given fluid bolus of 3 L and 20 units of NovoLog subcutaneous and admitted for uncontrolled diabetes and right index finger abscess. On my exam patient is not able to completely flex his right index finger. Patient states over the last few days patient has been having multiple episodes of diarrhea obtained takes antibiotics. Patient denies any vomiting abdominal pain or any blood in the diarrhea.   Review of Systems: As presented in the history of presenting illness, rest negative.  Past Medical History  Diagnosis Date  . HIV (human immunodeficiency virus infection) ([place]) 2009    CD4 count 100, VL 13800 (05/01/2010)  . Diabetes type 2, uncontrolled ([place])     HgA1c 17.6 (04/27/2010)  . Hypertension   . Genital warts   . Erectile dysfunction   . Chronic knee pain     right  . Osteomyelitis ([place])     h/o hand  . Diabetes mellitus   . Chronic pain   . AIDS [facility])    Past  Surgical History  Procedure Laterality Date  . Multiple extractions with alveoloplasty N/A 01/18/2013    Procedure: MULTIPLE EXTRACION 3, 6, 7, 10, 11, 13, 21, 22, 27, 28, 29, 30 WITH ALVEOLOPLASTY;  Surgeon: Gae Bon, DDS;  Location: [place];  Service: Oral Surgery;  Laterality: N/A;  . Hernia repair    . Minor irrigation and debridement of wound Right 04/22/2014    Procedure: IRRIGATION AND DEBRIDEMENT OF RIGHT NECK ABCESS;  Surgeon: Jerrell Belfast, MD;  Location[facility] OR;  Service: ENT;  Laterality: Right;  . I&d extremity Left 08/21/2014    Procedure: INCISION AND DRAINAGE LEFT SMALL FINGER;  Surgeon: Leanora Cover, MD;  Location: [place];  Service: Orthopedics;  Laterality: Left;   Social History:  reports that he has never smoked. He has never used smokeless tobacco. He reports that he does not drink alcohol or use illicit drugs. Where does patient live home. Can patient participate in ADLs? Yes.  Allergies  Allergen Reactions  . Sulfa Antibiotics Itching    Family History:  Family History  Problem Relation Age of Onset  . Hypertension Mother   . Arthritis Father   . Hypertension Father   . Hypertension Brother   . Cancer Maternal Grandmother 46    unknown type of cancer  . Depression Paternal Grandmother       Prior to Admission medications   Medication Sig Start Date End Date Taking? Authorizing Provider  aspirin 81 MG EC tablet Take 81 mg  by mouth daily.  08/20/11   [name], MD  cephALEXin (KEFLEX) 500 MG capsule Take 1 capsule (500 mg total) by mouth 4 (four) times daily. 03/03/15   [name], PA-C  dapsone 100 MG tablet TAKE 1 TABLET BY MOUTH EVERY DAY. NEED APPOINTMENT 09/19/14   [name], MD  doxycycline (VIBRAMYCIN) 50 MG capsule Take 2 capsules (100 mg total) by mouth 2 (two) times daily. 08/21/14   Leanora Cover, MD  elvitegravir-cobicistat-emtricitabine-tenofovir (GENVOYA) 150-150-200-10 MG TABS tablet Take 1 tablet by mouth daily with breakfast.  01/30/15   [name], MD  HYDROcodone-acetaminophen [facility]) 5-325 MG per tablet Take 1-2 tablets by mouth every 6 (six) hours as needed. 12/17/14   Veryl Speak, MD  imiquimod [facility]) 5 % cream Apply topically 3 (three) times a week. 01/30/15   [name], MD  Insulin Glargine (LANTUS SOLOSTAR) 100 UNIT/ML Solostar Pen Inject 60 Units into the skin daily at 10 pm. 06/01/14   [name], MD  insulin lispro (HUMALOG) 100 UNIT/ML KiwkPen Inject 0.08 mLs (8 Units total) into the skin 3 (three) times daily. 02/21/14   [name], MD  LORazepam (ATIVAN) 1 MG tablet Take 1 tablet (1 mg total) by mouth every 6 (six) hours as needed for anxiety. 01/30/15   [name], MD  oxyCODONE-acetaminophen [facility]) 5-325 MG per tablet 1-2 tabs po q6 hours prn pain 08/21/14   Leanora Cover, MD  pravastatin (PRAVACHOL) 40 MG tablet Take 1 tablet (40 mg total) by mouth daily. 01/03/14   [name], MD  PREZISTA 800 MG tablet TAKE 1 TABLET BY MOUTH DAILY WITH BREAKFAST 09/19/14   [name], MD  testosterone (ANDROGEL) 50 MG/5GM GEL Place 5 g onto the skin daily.     Historical Provider, MD  traMADol (ULTRAM) 50 MG tablet Take 1 tablet (50 mg total) by mouth every 6 (six) hours as needed. 03/03/15   [name], PA-C    Physical Exam: Filed Vitals:   03/08/15 1842 03/08/15 1953 03/08/15 2057 03/08/15 2221  BP: 119/79 107/69 105/72 134/93  Pulse: 88 88 87 74  Temp:  98.8 F (37.1 C)  98.4 F (36.9 C)  TempSrc:  Oral  Oral  Resp: 17 16 18    SpO2: 100% 100% 100% 100%     General:  Moderately built and nourished.  Eyes: Anicteric no pallor.  ENT: No discharge from the ears eyes nose and mouth.  Neck: No mass felt.  Cardiovascular: S1-S2 heard.  Respiratory: No rhonchi or crepitations.  Abdomen: Soft nontender bowel sounds present.  Skin: Right index finger distal aspect has abscess.  Musculoskeletal: Unable to flex right index finger. Swollen right index finger  with abscess in the distal aspect.  Psychiatric: Appears normal.  Neurologic: Alert awake oriented to time place and person. Moves all extremities.  Labs on Admission:  Basic Metabolic Panel:  Recent Labs Lab 03/02/15 2322 03/08/15 1700 03/08/15 1710  NA 129* 122* 124*  K 4.0 4.8 4.8  CL 94* 86* 90*  CO2 25 25  --   GLUCOSE 501* 789* >700*  BUN 12 11 14   CREATININE 1.18 1.13 0.90  CALCIUM 9.5 9.8  --    Liver Function Tests: No results for input(s): AST, ALT, ALKPHOS, BILITOT, PROT, ALBUMIN in the last 168 hours. No results for input(s): LIPASE, AMYLASE in the last 168 hours. No results for input(s): AMMONIA in the last 168 hours. CBC:  Recent Labs Lab 03/02/15 2322 03/08/15 1700  03/08/15 1710  WBC 12.9* 5.4  --   NEUTROABS 8.7* 3.3  --   HGB 13.1 13.3 16.0  HCT 40.8 39.1 47.0  MCV 83.6 81.1  --   PLT 241 298  --    Cardiac Enzymes: No results for input(s): CKTOTAL, CKMB, CKMBINDEX, TROPONINI in the last 168 hours.  BNP (last 3 results) No results for input(s): BNP in the last 8760 hours.  ProBNP (last 3 results) No results for input(s): PROBNP in the last 8760 hours.  CBG:  Recent Labs Lab 03/02/15 2229 03/03/15 0025 03/03/15 0136 03/08/15 1625 03/08/15 1736  GLUCAP 503* 481* 424* >600* >600*    Radiological Exams on Admission: Dg Finger Index Right  03/08/2015  CLINICAL DATA:  Swelling and pain of the right index finger for 3 weeks. EXAM: RIGHT INDEX FINGER 2+V COMPARISON:  None. FINDINGS: There is no evidence of fracture or dislocation. There is no evidence of arthropathy or other focal bone abnormality. Soft tissues are unremarkable. IMPRESSION: Negative. Electronically Signed   By: Abelardo Diesel M.D.   On: 03/08/2015 20:15     Assessment/Plan Principal Problem:   Type 2 diabetes mellitus with hyperosmolar nonketotic hyperglycemia (HCC) Active Problems:   HIV disease (HCC)   Abscess of finger of right hand   Hyperosmolar non-ketotic state  in patient with type 2 diabetes mellitus ([place])   1. Hyperosmolar nonketotic uncontrolled diabetes mellitus type 2 - most likely from noncompliance as patient's charts revealed that patient has been noncompliant previously with his medications. Check hemoglobin A1c. Patient's blood sugar has improved with subcutaneous NovoLog insulin 20 units and also 3 L of normal saline bolus. I will continue with aggressive hydration and patient's home dose of Lantus with sliding scale coverage. Patient advised to be compliant with medications and diet. Patient blood sugar would also been offset by right index finger infection. 2. Right index finger abscess - patient's right index finger has been incised and drained twice last one this evening. I have discussed with on-call hand surgeon Dr. Burney Gauze will be seeing patient in consult. Patient is on vancomycin for now. 3. Diarrhea - check stool for C. difficile since patient was on antibiotics. 4. HIV/AIDS - last CD4 count was 140. Patient is being followed with Dr. [name] of infectious disease. Continue present medications. 5. History of hypertension per the chart patient is presently not on any antihypertensives.  I have reviewed patient's old charts and labs. Personally reviewed patient's x-ray of the right index finger.   DVT Prophylaxis SCDs in anticipation of possible procedure.  Code Status: Full code.  Family Communication: Discussed with patient.  Disposition Plan: Admit to inpatient.    Keith [name] N. [facility] Pager [phone].  If 7PM-7AM, please contact night-coverage [email or web address] Password [facility] 03/08/2015, 10:47 PM

## 2015-03-08 NOTE — ED Notes (Signed)
398 CBG

## 2015-03-08 NOTE — ED Provider Notes (Signed)
CSN: [id number]     Arrival date & time 03/08/15  1524 History   By signing my name below, I, Keith [name], attest that this documentation has been prepared under the direction and in the presence of [name], PA-C. Electronically Signed: Randa [name], ED Scribe. 03/08/2015. 7:47 PM.    Chief Complaint  Patient presents with  . Finger Injury    The history is provided by the patient. No language interpreter was used.   HPI Comments: Keith [name] is a 49 y.o. male who presents to the Emergency Department complaining of recurrent infection to his right index finger onset 1 week prior. He presents with associated swelling to the tip of his index finger. He reports some nausea and chills the past 4 days as well. He notes that he had a hangnail to that finger 1 week ago and he pulled the hangnail. Since that time he has had increased swelling and pain of his finger which started around his nail and has progressed to his entire finger. Pt states that he has ben doing warm water soaks for the past 3 days with no relief. He state that he did tried to drain the area with no relief. He states that the pain is worse with movement. Pt states that his Blood sugars have been well controlled lately. Per pt's chart review, his last CD4 count was 140 on 01/06/15.  Past Medical History  Diagnosis Date  . HIV (human immunodeficiency virus infection) ([place]) 2009    CD4 count 100, VL 13800 (05/01/2010)  . Diabetes type 2, uncontrolled ([place])     HgA1c 17.6 (04/27/2010)  . Hypertension   . Genital warts   . Erectile dysfunction   . Chronic knee pain     right  . Osteomyelitis ([place])     h/o hand  . Diabetes mellitus   . Chronic pain   . AIDS [facility])    Past Surgical History  Procedure Laterality Date  . Multiple extractions with alveoloplasty N/A 01/18/2013    Procedure: MULTIPLE EXTRACION 3, 6, 7, 10, 11, 13, 21, 22, 27, 28, 29, 30 WITH ALVEOLOPLASTY;  Surgeon: Gae Bon, DDS;  Location: [place];  Service: Oral Surgery;  Laterality: N/A;  . Hernia repair    . Minor irrigation and debridement of wound Right 04/22/2014    Procedure: IRRIGATION AND DEBRIDEMENT OF RIGHT NECK ABCESS;  Surgeon: Jerrell Belfast, MD;  Location: [facility] OR;  Service: ENT;  Laterality: Right;  . I&d extremity Left 08/21/2014    Procedure: INCISION AND DRAINAGE LEFT SMALL FINGER;  Surgeon: Leanora Cover, MD;  Location: [place];  Service: Orthopedics;  Laterality: Left;   Family History  Problem Relation Age of Onset  . Hypertension Mother   . Arthritis Father   . Hypertension Father   . Hypertension Brother   . Cancer Maternal Grandmother 24    unknown type of cancer  . Depression Paternal Grandmother    Social History  Substance Use Topics  . Smoking status: Never Smoker   . Smokeless tobacco: Never Used  . Alcohol Use: No    Review of Systems  Constitutional: Positive for chills. Negative for fever.  Eyes: Negative.   Respiratory: Negative.   Cardiovascular: Negative.   Gastrointestinal: Positive for nausea. Negative for vomiting.  Genitourinary: Negative.   Musculoskeletal: Positive for joint swelling and arthralgias.  Skin: Negative.   Neurological: Negative for weakness and numbness.     Allergies  Sulfa antibiotics  Home Medications  Prior to Admission medications   Medication Sig Start Date End Date Taking? Authorizing Provider  aspirin 81 MG EC tablet Take 81 mg by mouth daily.  08/20/11   [name], MD  cephALEXin (KEFLEX) 500 MG capsule Take 1 capsule (500 mg total) by mouth 4 (four) times daily. 03/03/15   [name], PA-C  dapsone 100 MG tablet TAKE 1 TABLET BY MOUTH EVERY DAY. NEED APPOINTMENT 09/19/14   [name], MD  doxycycline (VIBRAMYCIN) 50 MG capsule Take 2 capsules (100 mg total) by mouth 2 (two) times daily. 08/21/14   Leanora Cover, MD  elvitegravir-cobicistat-emtricitabine-tenofovir (GENVOYA) 150-150-200-10 MG TABS tablet Take 1 tablet by mouth daily with  breakfast. 01/30/15   [name], MD  HYDROcodone-acetaminophen [facility]) 5-325 MG per tablet Take 1-2 tablets by mouth every 6 (six) hours as needed. 12/17/14   Veryl Speak, MD  imiquimod [place]) 5 % cream Apply topically 3 (three) times a week. 01/30/15   [name], MD  Insulin Glargine (LANTUS SOLOSTAR) 100 UNIT/ML Solostar Pen Inject 60 Units into the skin daily at 10 pm. 06/01/14   [name], MD  insulin lispro (HUMALOG) 100 UNIT/ML KiwkPen Inject 0.08 mLs (8 Units total) into the skin 3 (three) times daily. 02/21/14   [name], MD  LORazepam (ATIVAN) 1 MG tablet Take 1 tablet (1 mg total) by mouth every 6 (six) hours as needed for anxiety. 01/30/15   [name], MD  oxyCODONE-acetaminophen [facility]) 5-325 MG per tablet 1-2 tabs po q6 hours prn pain 08/21/14   Leanora Cover, MD  pravastatin (PRAVACHOL) 40 MG tablet Take 1 tablet (40 mg total) by mouth daily. 01/03/14   [name], MD  PREZISTA 800 MG tablet TAKE 1 TABLET BY MOUTH DAILY WITH BREAKFAST 09/19/14   [name], MD  testosterone (ANDROGEL) 50 MG/5GM GEL Place 5 g onto the skin daily.     Historical Provider, MD  traMADol (ULTRAM) 50 MG tablet Take 1 tablet (50 mg total) by mouth every 6 (six) hours as needed. 03/03/15   [name], PA-C   BP 119/79 mmHg  Pulse 88  Temp(Src) 98 F (36.7 C) (Oral)  Resp 17  SpO2 100%   Physical Exam  Constitutional: He is oriented to person, place, and time. He appears well-developed and well-nourished. No distress.  HENT:  Head: Normocephalic and atraumatic.  Right Ear: External ear normal.  Left Ear: External ear normal.  Nose: Nose normal.  Mouth/Throat: Oropharynx is clear and moist. No oropharyngeal exudate.  Eyes: Conjunctivae and EOM are normal. Pupils are equal, round, and reactive to light. Right eye exhibits no discharge. Left eye exhibits no discharge. No scleral icterus.  Neck: Normal range of motion. Neck supple. No JVD present. No tracheal  deviation present.  Cardiovascular: Normal rate and regular rhythm.   Pulmonary/Chest: Effort normal and breath sounds normal. No stridor. No respiratory distress.  Abdominal: Bowel sounds are normal. He exhibits no distension.  Musculoskeletal: He exhibits edema and tenderness.  Visible abscess up to the middle phalanx of the right index finger, limited ROM, index finger TTP.   Lymphadenopathy:    He has no cervical adenopathy.  Neurological: He is alert and oriented to person, place, and time. He exhibits normal muscle tone. Coordination normal.  Skin: Skin is warm and dry. No rash noted. He is not diaphoretic. No erythema. No pallor.  Psychiatric: He has a normal mood and affect. His behavior is normal. Judgment and thought content  normal.  Nursing note and vitals reviewed.  Right index finger at presentation    Right index finger after warm soaks and I&D     ED Course  Procedures (including critical care time)  INCISION AND DRAINAGE PROCEDURE NOTE: INCISION AND DRAINAGE Performed by: [name] Consent: Verbal consent obtained. Risks and benefits: risks, benefits and alternatives were discussed Time out performed prior to procedure Type: paronychia Body area: right index finger Anesthesia: local infiltration Incision was made with a blunt dissection with tweezers and hemostats Local anesthetic: none Anesthetic total:none Complexity: simple Manual expression to drain purulent discharge  Drainage: purulent Drainage amount: Moderate  Packing material: Not applicable  Patient tolerance: Patient tolerated the procedure well with no immediate complications.     Pt had drainage from cuticle with warm soaks  DIAGNOSTIC STUDIES: Oxygen Saturation is 100% on RA, normal by my interpretation.    COORDINATION OF CARE: 7:47 PM-Discussed treatment plan which includes Check blood glucose, I&D and re-evaluate felon with pt at bedside and pt agreed to plan.       Labs  Review Labs Reviewed  URINALYSIS, ROUTINE W REFLEX MICROSCOPIC (NOT AT [facility]) - Abnormal; Notable for the following:    Specific Gravity, Urine 1.033 (*)    Glucose, UA >1000 (*)    Hgb urine dipstick TRACE (*)    All other components within normal limits  BASIC METABOLIC PANEL - Abnormal; Notable for the following:    Sodium 122 (*)    Chloride 86 (*)    Glucose, Bld 789 (*)    All other components within normal limits  URINE MICROSCOPIC-ADD ON - Abnormal; Notable for the following:    Squamous Epithelial / LPF 0-5 (*)    All other components within normal limits  CBG MONITORING, ED - Abnormal; Notable for the following:    Glucose-Capillary >600 (*)    All other components within normal limits  I-STAT CHEM 8, ED - Abnormal; Notable for the following:    Sodium 124 (*)    Chloride 90 (*)    Glucose, Bld >700 (*)    All other components within normal limits  CBG MONITORING, ED - Abnormal; Notable for the following:    Glucose-Capillary >600 (*)    All other components within normal limits  CBC WITH DIFFERENTIAL/PLATELET  CBG MONITORING, ED    Imaging Review No results found.     EKG Interpretation None      MDM   Final diagnoses:  Hyperglycemia   Pt with paronychia, recently drained, has presented with. Neck get with abscess is larger than last week.  He complains of mild nausea and pain, otherwise he denies fever, chills, sweats, V Pt has chronically elevated sugars, poorly controlled diabetes.  Has not taken his insulin today. He initially stated that he checked his sugar "a few days ago" and it was 95. He later states that he lied and that he rarely checks his sugars.  Plan:  Paronychia -  redrain, IV abx, r/o felon, secure follow up Hyperglycemia - CBC, BMP, UA, give IVF insulin.  Depending on labs, will need to transfer to acute care side or admit.  The pt was presented to Dr. Ashok Cordia, who has personally seen and evaluated the pt.  He is in agreement with  assessment and plan.  Pt had drainage of paronychia with warm soaks and I&D. Pt's sugars improved to 398 after 3L IVF and 20 units novolog. Lab work revealed glucosuria with hematuria, and no ketones Pt did  not have an Anion Gap, WBC improved from last visit  He received clinda IV with fluid and insulin.  Pain was mildly improved with tramadol.  Dr. Ashok Cordia requested that he be admitted for hyperglycemia, recurrent infection with multiple risk factors including diabetes and HIV.  Pt will need diabetes education, abx and may need hand consult.    The patient was admitted to [place] by Dr. Hal Hope.  At the time the patient left the unit his sugars had improved to 299.  I personally performed the services described in this documentation, which was scribed in my presence. The recorded information has been reviewed and is accurate.            [name], PA-C 03/09/15 [id number]  Lajean Saver, MD 03/10/15 [phone]

## 2015-03-08 NOTE — ED Notes (Signed)
CBG 299 

## 2015-03-08 NOTE — Progress Notes (Signed)
ANTIBIOTIC CONSULT NOTE - INITIAL  Pharmacy Consult for Vancomycin  Indication: cellulitis  Allergies  Allergen Reactions  . Sulfa Antibiotics Itching    Patient Measurements: Weight: 284 lb 6.3 oz (129 kg) Adjusted Body Weight: 100 kg  Vital Signs: Temp: 98.4 F (36.9 C) (11/30 2221) Temp Source: Oral (11/30 2221) BP: 134/93 mmHg (11/30 2221) Pulse Rate: 74 (11/30 2221) Intake/Output from previous day:   Intake/Output from this shift: Total I/O In: 3000 [I.V.:3000] Out: -   Labs:  Recent Labs  03/08/15 1700 03/08/15 1710  WBC 5.4  --   HGB 13.3 16.0  PLT 298  --   CREATININE 1.13 0.90   Estimated Creatinine Clearance: 132.5 mL/min (by C-G formula based on Cr of 0.9). No results for input(s): VANCOTROUGH, VANCOPEAK, VANCORANDOM, GENTTROUGH, GENTPEAK, GENTRANDOM, TOBRATROUGH, TOBRAPEAK, TOBRARND, AMIKACINPEAK, AMIKACINTROU, AMIKACIN in the last 72 hours.   Microbiology: No results found for this or any previous visit (from the past 720 hour(s)).  Medical History: Past Medical History  Diagnosis Date  . HIV (human immunodeficiency virus infection) ([place]) 2009    CD4 count 100, VL 13800 (05/01/2010)  . Diabetes type 2, uncontrolled ([place])     HgA1c 17.6 (04/27/2010)  . Hypertension   . Genital warts   . Erectile dysfunction   . Chronic knee pain     right  . Osteomyelitis ([place])     h/o hand  . Diabetes mellitus   . Chronic pain   . AIDS ([place])     Medications:  Prescriptions prior to admission  Medication Sig Dispense Refill Last Dose  . aspirin 81 MG EC tablet Take 81 mg by mouth daily.    12/10/2014 at Unknown time  . cephALEXin (KEFLEX) 500 MG capsule Take 1 capsule (500 mg total) by mouth 4 (four) times daily. 40 capsule 0   . dapsone 100 MG tablet TAKE 1 TABLET BY MOUTH EVERY DAY. NEED APPOINTMENT 30 tablet 4 12/10/2014 at Unknown time  . doxycycline (VIBRAMYCIN) 50 MG capsule Take 2 capsules (100 mg total) by mouth 2 (two) times daily. 40 capsule 0  12/10/2014 at Unknown time  . elvitegravir-cobicistat-emtricitabine-tenofovir (GENVOYA) 150-150-200-10 MG TABS tablet Take 1 tablet by mouth daily with breakfast. 90 tablet 3   . HYDROcodone-acetaminophen (NORCO) 5-325 MG per tablet Take 1-2 tablets by mouth every 6 (six) hours as needed. 10 tablet 0   . imiquimod (ALDARA) 5 % cream Apply topically 3 (three) times a week. 12 each 0   . Insulin Glargine (LANTUS SOLOSTAR) 100 UNIT/ML Solostar Pen Inject 60 Units into the skin daily at 10 pm. 15 mL 3 12/10/2014 at Unknown time  . insulin lispro (HUMALOG) 100 UNIT/ML KiwkPen Inject 0.08 mLs (8 Units total) into the skin 3 (three) times daily. 15 mL 0 12/10/2014 at Unknown time  . LORazepam (ATIVAN) 1 MG tablet Take 1 tablet (1 mg total) by mouth every 6 (six) hours as needed for anxiety. 30 tablet 3   . oxyCODONE-acetaminophen (PERCOCET) 5-325 MG per tablet 1-2 tabs po q6 hours prn pain 40 tablet 0 12/10/2014 at Unknown time  . pravastatin (PRAVACHOL) 40 MG tablet Take 1 tablet (40 mg total) by mouth daily. 30 tablet 11 12/10/2014 at Unknown time  . PREZISTA 800 MG tablet TAKE 1 TABLET BY MOUTH DAILY WITH BREAKFAST 30 tablet 4 Taking  . testosterone (ANDROGEL) 50 MG/5GM GEL Place 5 g onto the skin daily.    12/10/2014 at Unknown time  . traMADol (ULTRAM) 50 MG tablet Take 1  tablet (50 mg total) by mouth every 6 (six) hours as needed. 15 tablet 0    Assessment: 49 y.o. male with right index finger abcess/cellulitis for empiric antibiotics.    Goal of Therapy:  Vancomycin trough level 10-15 mcg/ml  Plan:  Vancomycin 2 g IV now, then 1250 mg IV q8h   Pluma Diniz, Bronson Curb 03/08/2015,11:15 PM

## 2015-03-08 NOTE — ED Notes (Signed)
Pt here for pain and infection to his right forefinger. He states he was seen here 4 days ago had it drained but his finger is still hurting with a pocket of pus to distal portion of his right forefinger.

## 2015-03-08 NOTE — ED Notes (Signed)
Pt given water for the fluid challenge

## 2015-03-08 NOTE — ED Notes (Signed)
  CBG reads HI >600

## 2015-03-08 NOTE — ED Notes (Signed)
Pt given a specimen cup for a urine specimen. Pt unable to provide sample at this time.

## 2015-03-09 ENCOUNTER — Encounter (HOSPITAL_COMMUNITY): Payer: Self-pay

## 2015-03-09 DIAGNOSIS — E1101 Type 2 diabetes mellitus with hyperosmolarity with coma: Secondary | ICD-10-CM

## 2015-03-09 LAB — BASIC METABOLIC PANEL
Anion gap: 7 (ref 5–15)
Anion gap: 6 (ref 5–15)
BUN: 10 mg/dL (ref 6–20)
BUN: 10 mg/dL (ref 6–20)
CO2: 25 mmol/L (ref 22–32)
CO2: 26 mmol/L (ref 22–32)
Calcium: 8.8 mg/dL — ABNORMAL LOW (ref 8.9–10.3)
Calcium: 8.9 mg/dL (ref 8.9–10.3)
Chloride: 100 mmol/L — ABNORMAL LOW (ref 101–111)
Chloride: 101 mmol/L (ref 101–111)
Creatinine, Ser: 0.83 mg/dL (ref 0.61–1.24)
Creatinine, Ser: 0.79 mg/dL (ref 0.61–1.24)
GFR calc Af Amer: >60 mL/min (ref >60)
GFR calc Af Amer: >60 mL/min (ref >60)
GFR calc non Af Amer: >60 mL/min (ref >60)
GFR calc non Af Amer: >60 mL/min (ref >60)
Glucose, Bld: 277 mg/dL — ABNORMAL HIGH (ref 65–99)
Glucose, Bld: 325 mg/dL — ABNORMAL HIGH (ref 65–99)
Potassium: 3.9 mmol/L (ref 3.5–5.1)
Potassium: 3.8 mmol/L (ref 3.5–5.1)
Sodium: 132 mmol/L — ABNORMAL LOW (ref 135–145)
Sodium: 133 mmol/L — ABNORMAL LOW (ref 135–145)

## 2015-03-09 LAB — CBC
HCT: 36 % — ABNORMAL LOW (ref 39.0–52.0)
Hemoglobin: 12 g/dL — ABNORMAL LOW (ref 13.0–17.0)
MCH: 27 pg (ref 26.0–34.0)
MCHC: 33.3 g/dL (ref 30.0–36.0)
MCV: 80.9 fL (ref 78.0–100.0)
Platelets: 261 10*3/uL (ref 150–400)
RBC: 4.45 MIL/uL (ref 4.22–5.81)
RDW: 12.2 % (ref 11.5–15.5)
WBC: 4.8 10*3/uL (ref 4.0–10.5)

## 2015-03-09 LAB — GLUCOSE, CAPILLARY: Glucose-Capillary: 327 mg/dL — ABNORMAL HIGH (ref 65–99)

## 2015-03-09 MED ORDER — DOXYCYCLINE HYCLATE 100 MG PO CAPS: 100.0000 mg | ORAL_CAPSULE | Freq: Two times a day (BID) | ORAL | Status: DC

## 2015-03-09 MED ORDER — INSULIN ASPART 100 UNIT/ML ~~LOC~~ SOLN: 0.0000 [IU] | Freq: Every day | SUBCUTANEOUS | Status: DC

## 2015-03-09 MED ORDER — HYDROCODONE-ACETAMINOPHEN 5-325 MG PO TABS: 1.0000 | ORAL_TABLET | Freq: Four times a day (QID) | ORAL | Status: DC | PRN

## 2015-03-09 MED ORDER — INSULIN ASPART 100 UNIT/ML ~~LOC~~ SOLN: 0.0000 [IU] | Freq: Three times a day (TID) | SUBCUTANEOUS | Status: DC

## 2015-03-09 NOTE — Progress Notes (Signed)
Keith [name] to be D/C'd  per MD order. Discussed with the patient and all questions fully answered.  VSS, Skin clean, dry and intact without evidence of skin break down, no evidence of skin tears noted.  IV catheter discontinued intact. Site without signs and symptoms of complications. Dressing and pressure applied.  An After Visit Summary was printed and given to the patient. Patient received prescription.  D/c education completed with patient/family including follow up instructions, medication list, d/c activities limitations if indicated, with other d/c instructions as indicated by MD - patient able to verbalize understanding, all questions fully answered.   Patient instructed to return to ED, call 911, or call MD for any changes in condition.   Patient to be escorted via [place], and D/C home via private auto.

## 2015-03-09 NOTE — Discharge Instructions (Signed)
Follow with Primary MD in 7 days   Get CBC, CMP, 2 view Chest X ray checked  by Primary MD next visit.   Keep your Right hand clean and dry at all times.  Warm soapy soaks to right index finger with massaging of nailfold to allow drainage twice a day   Activity: As tolerated with Full fall precautions use walker/cane & assistance as needed   Disposition Home     Diet: Heart Healthy Low Carb.  Accuchecks 4 times/day, Once in AM empty stomach and then before each meal. Log in all results and show them to your Prim.MD in 3 days. If any glucose reading is under 80 or above 300 call your Prim MD immidiately. Follow Low glucose instructions for glucose under 80 as instructed.    For Heart failure patients - Check your Weight same time everyday, if you gain over 2 pounds, or you develop in leg swelling, experience more shortness of breath or chest pain, call your Primary MD immediately. Follow Cardiac Low Salt Diet and 1.5 lit/day fluid restriction.   On your next visit with your primary care physician please Get Medicines reviewed and adjusted.   Please request your Prim.MD to go over all Hospital Tests and Procedure/Radiological results at the follow up, please get all Hospital records sent to your Prim MD by signing hospital release before you go home.   If you experience worsening of your admission symptoms, develop shortness of breath, life threatening emergency, suicidal or homicidal thoughts you must seek medical attention immediately by calling 911 or calling your MD immediately  if symptoms less severe.  You Must read complete instructions/literature along with all the possible adverse reactions/side effects for all the Medicines you take and that have been prescribed to you. Take any new Medicines after you have completely understood and accpet all the possible adverse reactions/side effects.   Do not drive, operating heavy machinery, perform activities at heights, swimming or  participation in water activities or provide baby sitting services if your were admitted for syncope or siezures until you have seen by Primary MD or a Neurologist and advised to do so again.  Do not drive when taking Pain medications.    Do not take more than prescribed Pain, Sleep and Anxiety Medications  Special Instructions: If you have smoked or chewed Tobacco  in the last 2 yrs please stop smoking, stop any regular Alcohol  and or any Recreational drug use.  Wear Seat belts while driving.   Please note  You were cared for by a hospitalist during your hospital stay. If you have any questions about your discharge medications or the care you received while you were in the hospital after you are discharged, you can call the unit and asked to speak with the hospitalist on call if the hospitalist that took care of you is not available. Once you are discharged, your primary care physician will handle any further medical issues. Please note that NO REFILLS for any discharge medications will be authorized once you are discharged, as it is imperative that you return to your primary care physician (or establish a relationship with a primary care physician if you do not have one) for your aftercare needs so that they can reassess your need for medications and monitor your lab values.

## 2015-03-09 NOTE — Consult Note (Signed)
Reason for Consult:right index finger swelling Referring Physician: [name] is an 49 y.o. male.  HPI: s/p I and D times 2 in ER this past week with persistent swelling of right index finger  Past Medical History  Diagnosis Date  . HIV (human immunodeficiency virus infection) ([place]) 2009    CD4 count 100, VL 13800 (05/01/2010)  . Diabetes type 2, uncontrolled ([place])     HgA1c 17.6 (04/27/2010)  . Hypertension   . Genital warts   . Erectile dysfunction   . Chronic knee pain     right  . Osteomyelitis ([place])     h/o hand  . Diabetes mellitus   . Chronic pain   . AIDS [facility])     Past Surgical History  Procedure Laterality Date  . Multiple extractions with alveoloplasty N/A 01/18/2013    Procedure: MULTIPLE EXTRACION 3, 6, 7, 10, 11, 13, 21, 22, 27, 28, 29, 30 WITH ALVEOLOPLASTY;  Surgeon: Gae Bon, DDS;  Location: [place];  Service: Oral Surgery;  Laterality: N/A;  . Hernia repair    . Minor irrigation and debridement of wound Right 04/22/2014    Procedure: IRRIGATION AND DEBRIDEMENT OF RIGHT NECK ABCESS;  Surgeon: Jerrell Belfast, MD;  Location: [facility] OR;  Service: ENT;  Laterality: Right;  . I&d extremity Left 08/21/2014    Procedure: INCISION AND DRAINAGE LEFT SMALL FINGER;  Surgeon: Leanora Cover, MD;  Location: [place];  Service: Orthopedics;  Laterality: Left;    Family History  Problem Relation Age of Onset  . Hypertension Mother   . Arthritis Father   . Hypertension Father   . Hypertension Brother   . Cancer Maternal Grandmother 17    unknown type of cancer  . Depression Paternal Grandmother     Social History:  reports that he has never smoked. He has never used smokeless tobacco. He reports that he does not drink alcohol or use illicit drugs.  Allergies:  Allergies  Allergen Reactions  . Sulfa Antibiotics Itching    Medications:  Scheduled: . dapsone  100 mg Oral Daily  . Darunavir Ethanolate  800 mg Oral Q breakfast  .  elvitegravir-cobicistat-emtricitabine-tenofovir  1 tablet Oral Q breakfast  . [START ON 03/10/2015] imiquimod   Topical Once per day on Mon Wed Fri  . insulin aspart  0-15 Units Subcutaneous TID WC  . insulin glargine  60 Units Subcutaneous QHS  . lidocaine (PF)  30 mL Intradermal Once  . pravastatin  40 mg Oral q1800  . vancomycin  1,250 mg Intravenous Q8H    Results for orders placed or performed during the hospital encounter of 03/08/15 (from the past 48 hour(s))  POC CBG, ED     Status: Abnormal   Collection Time: 03/08/15  4:25 PM  Result Value Ref Range   Glucose-Capillary >600 (HH) 65 - 99 mg/dL   Comment 1 Notify RN    Comment 2 Document in Chart   CBC with Differential (PNL)     Status: None   Collection Time: 03/08/15  5:00 PM  Result Value Ref Range   WBC 5.4 4.0 - 10.5 K/uL   RBC 4.82 4.22 - 5.81 MIL/uL   Hemoglobin 13.3 13.0 - 17.0 g/dL   HCT 39.1 39.0 - 52.0 %   MCV 81.1 78.0 - 100.0 fL   MCH 27.6 26.0 - 34.0 pg   MCHC 34.0 30.0 - 36.0 g/dL   RDW 12.3 11.5 - 15.5 %   Platelets 298 150 -  400 K/uL   Neutrophils Relative % 61 %   Neutro Abs 3.3 1.7 - 7.7 K/uL   Lymphocytes Relative 32 %   Lymphs Abs 1.7 0.7 - 4.0 K/uL   Monocytes Relative 6 %   Monocytes Absolute 0.3 0.1 - 1.0 K/uL   Eosinophils Relative 1 %   Eosinophils Absolute 0.1 0.0 - 0.7 K/uL   Basophils Relative 0 %   Basophils Absolute 0.0 0.0 - 0.1 K/uL  Basic metabolic panel     Status: Abnormal   Collection Time: 03/08/15  5:00 PM  Result Value Ref Range   Sodium 122 (L) 135 - 145 mmol/L   Potassium 4.8 3.5 - 5.1 mmol/L   Chloride 86 (L) 101 - 111 mmol/L   CO2 25 22 - 32 mmol/L   Glucose, Bld 789 (HH) 65 - 99 mg/dL    Comment: REPEATED TO VERIFY CRITICAL RESULT CALLED TO, READ BACK BY AND VERIFIED WITH: N CROMPTON,RN 1800 03/08/2015 WBOND    BUN 11 6 - 20 mg/dL   Creatinine, Ser 1.13 0.61 - 1.24 mg/dL   Calcium 9.8 8.9 - 10.3 mg/dL   GFR calc non Af Amer >60 >60 mL/min   GFR calc Af Amer >60  >60 mL/min    Comment: (NOTE) The eGFR has been calculated using the CKD EPI equation. This calculation has not been validated in all clinical situations. eGFR's persistently <60 mL/min signify possible Chronic Kidney Disease.    Anion gap 11 5 - 15  I-Stat Chem 8, ED  (not at [facility], [facility])     Status: Abnormal   Collection Time: 03/08/15  5:10 PM  Result Value Ref Range   Sodium 124 (L) 135 - 145 mmol/L   Potassium 4.8 3.5 - 5.1 mmol/L   Chloride 90 (L) 101 - 111 mmol/L   BUN 14 6 - 20 mg/dL   Creatinine, Ser 0.90 0.61 - 1.24 mg/dL   Glucose, Bld >700 (HH) 65 - 99 mg/dL   Calcium, Ion 1.17 1.12 - 1.23 mmol/L   TCO2 23 0 - 100 mmol/L   Hemoglobin 16.0 13.0 - 17.0 g/dL   HCT 47.0 39.0 - 52.0 %   Comment NOTIFIED PHYSICIAN   POC CBG, ED     Status: Abnormal   Collection Time: 03/08/15  5:36 PM  Result Value Ref Range   Glucose-Capillary >600 (HH) 65 - 99 mg/dL   Comment 1 Notify RN    Comment 2 Document in Chart   Urinalysis, Routine w reflex microscopic (not at [facility])     Status: Abnormal   Collection Time: 03/08/15  6:38 PM  Result Value Ref Range   Color, Urine YELLOW YELLOW   APPearance CLEAR CLEAR   Specific Gravity, Urine 1.033 (H) 1.005 - 1.030   pH 5.0 5.0 - 8.0   Glucose, UA >1000 (A) NEGATIVE mg/dL   Hgb urine dipstick TRACE (A) NEGATIVE   Bilirubin Urine NEGATIVE NEGATIVE   Ketones, ur NEGATIVE NEGATIVE mg/dL   Protein, ur NEGATIVE NEGATIVE mg/dL   Nitrite NEGATIVE NEGATIVE   Leukocytes, UA NEGATIVE NEGATIVE  Urine microscopic-add on     Status: Abnormal   Collection Time: 03/08/15  6:38 PM  Result Value Ref Range   Squamous Epithelial / LPF 0-5 (A) NONE SEEN   WBC, UA 0-5 0 - 5 WBC/hpf   RBC / HPF 0-5 0 - 5 RBC/hpf   Bacteria, UA NONE SEEN NONE SEEN  Glucose, capillary     Status: Abnormal   Collection Time:  03/08/15 11:13 PM  Result Value Ref Range   Glucose-Capillary 242 (H) 65 - 99 mg/dL   Comment 1 Notify RN    Comment 2 Document in Chart   Basic  metabolic panel     Status: Abnormal   Collection Time: 03/09/15 12:56 AM  Result Value Ref Range   Sodium 132 (L) 135 - 145 mmol/L    Comment: DELTA CHECK NOTED   Potassium 3.9 3.5 - 5.1 mmol/L    Comment: DELTA CHECK NOTED   Chloride 100 (L) 101 - 111 mmol/L   CO2 25 22 - 32 mmol/L   Glucose, Bld 277 (H) 65 - 99 mg/dL   BUN 10 6 - 20 mg/dL   Creatinine, Ser 0.83 0.61 - 1.24 mg/dL   Calcium 8.8 (L) 8.9 - 10.3 mg/dL   GFR calc non Af Amer >60 >60 mL/min   GFR calc Af Amer >60 >60 mL/min    Comment: (NOTE) The eGFR has been calculated using the CKD EPI equation. This calculation has not been validated in all clinical situations. eGFR's persistently <60 mL/min signify possible Chronic Kidney Disease.    Anion gap 7 5 - 15    Dg Finger Index Right  03/08/2015  CLINICAL DATA:  Swelling and pain of the right index finger for 3 weeks. EXAM: RIGHT INDEX FINGER 2+V COMPARISON:  None. FINDINGS: There is no evidence of fracture or dislocation. There is no evidence of arthropathy or other focal bone abnormality. Soft tissues are unremarkable. IMPRESSION: Negative. Electronically Signed   By: Abelardo Diesel M.D.   On: 03/08/2015 20:15    Review of Systems  All other systems reviewed and are negative.  Blood pressure 131/71, pulse 62, temperature 97.9 F (36.6 C), temperature source Oral, resp. rate 19, height 5' 9" (1.753 m), weight 129.7 kg (285 lb 15 oz), SpO2 100 %. Physical Exam  Constitutional: He is oriented to person, place, and time. He appears well-developed and well-nourished.  HENT:  Head: Normocephalic and atraumatic.  Cardiovascular: Normal rate.   Respiratory: Effort normal.  Musculoskeletal:       Right hand: He exhibits tenderness and swelling.  Right index paronychia with spontaneous drainage and no apparent joint or tendon sheath involvement   Neurological: He is alert and oriented to person, place, and time.  Skin: Skin is warm.  Psychiatric: He has a normal mood  and affect. His behavior is normal. Judgment and thought content normal.    Assessment/Plan: As above   Would start warm soapy soaks to right index finger with massaging of nailfold to allow drainage   D/c on po doxy or bactrim to cover MRSA   Will see in my office this [place] A 03/09/2015, 7:37 AM

## 2015-03-09 NOTE — Care Management Note (Signed)
Case Management Note  Patient Details  Name: Keith [name] MRN: [name] Date of Birth: [birth year]  Subjective/Objective:                    Action/Plan:   Expected Discharge Date:                  Expected Discharge Plan:  Home/Self Care  In-House Referral:     Discharge planning Services     Post Acute Care Choice:    Choice offered to:     DME Arranged:    DME Agency:     HH Arranged:    [place] Agency:     Status of Service:  In process, will continue to follow  Medicare Important Message Given:    Date Medicare IM Given:    Medicare IM give by:    Date Additional Medicare IM Given:    Additional Medicare Important Message give by:     If discussed at [place] of Stay Meetings, dates discussed:    Additional Comments: Initial UR completed  Marilu Favre, RN 03/09/2015, 8:01 AM

## 2015-03-09 NOTE — Discharge Summary (Signed)
Keith [name], is a 49 y.o. male  DOB [birth year]  MRN [id number].  Admission date:  03/08/2015  Admitting Physician  Rise Patience, MD  Discharge Date:  03/09/2015   Primary MD  No PCP Per Patient  Recommendations for primary care physician for things to follow:   Monitor Right index finger, glycemic control, CBC BMP closely.   Admission Diagnosis  Hyperglycemia [R73.9]   Discharge Diagnosis  Hyperglycemia [R73.9]    Principal Problem:   Type 2 diabetes mellitus with hyperosmolar nonketotic hyperglycemia (HCC) Active Problems:   HIV disease (HCC)   Abscess of finger of right hand   Hyperosmolar non-ketotic state in patient with type 2 diabetes mellitus ([name])      Past Medical History  Diagnosis Date  . HIV (human immunodeficiency virus infection) ([employer]) 2009    CD4 count 100, VL 13800 (05/01/2010)  . Diabetes type 2, uncontrolled ([place])     HgA1c 17.6 (04/27/2010)  . Hypertension   . Genital warts   . Erectile dysfunction   . Chronic knee pain     right  . Osteomyelitis ([place])     h/o hand  . Diabetes mellitus   . Chronic pain   . AIDS [facility])     Past Surgical History  Procedure Laterality Date  . Multiple extractions with alveoloplasty N/A 01/18/2013    Procedure: MULTIPLE EXTRACION 3, 6, 7, 10, 11, 13, 21, 22, 27, 28, 29, 30 WITH ALVEOLOPLASTY;  Surgeon: Gae Bon, DDS;  Location: [place];  Service: Oral Surgery;  Laterality: N/A;  . Hernia repair    . Minor irrigation and debridement of wound Right 04/22/2014    Procedure: IRRIGATION AND DEBRIDEMENT OF RIGHT NECK ABCESS;  Surgeon: Jerrell Belfast, MD;  Location: [facility] OR;  Service: ENT;  Laterality: Right;  . I&d extremity Left 08/21/2014    Procedure: INCISION AND DRAINAGE LEFT SMALL FINGER;  Surgeon: Leanora Cover, MD;  Location: [place];   Service: Orthopedics;  Laterality: Left;       HPI  from the history and physical done on the day of admission:     Keith [name] is a 49 y.o. male with history of diabetes mellitus type 2, HIV/AIDS presents to the ER because of increasing pain in his right index finger. Patient had come to the ER a week ago and patient's abscess in the right index finger was incised and drained and patient was given pain relief medications and antibiotics. Patient states he has been taking these despite which patient's finger has become more swollen and painful. In the ER patient had incision and drain the right index finger abscess which is mostly in the distal phalanx. Patient's blood sugars found to be more than 700 and patient states he has been compliant with his medications. Patient takes Lantus at bedtime. Patient was given fluid bolus of 3 L and 20 units of NovoLog subcutaneous and admitted for uncontrolled diabetes and right index finger abscess. On my exam patient is not able to completely flex  his right index finger. Patient states over the last few days patient has been having multiple episodes of diarrhea obtained takes antibiotics. Patient denies any vomiting abdominal pain or any blood in the diarrhea.     Hospital Course:    1. Right index finger abscess. Has received tetanus shot few months ago per patient, seen here by hand surgeon Dr. Burney Gauze, recommendation to discharge on doxycycline with outpatient follow-up with him in the office. Wound care instructions as provided by hand surgeon have been given to the patient in writing as well. Requested to follow with PCP, his ID physician and Dr. Burney Gauze closely.   2. DM type II. Poor compliance and control, last A1c in the system was close to 18, he was admitted here in hyperosmolar nonketotic state. Was treated with IV fluids along with subcutaneous insulin with good effect, CBG stable, he has been counseled on compliance with his insulin and given  written instructions on Accu-Cheks, has been requested to maintain her Accu-Chek log book and show it to PCP in the next 2-3 days. A1c from this admission is pending.   3. Diarrhea. Currently resolved. Could have been due to Keflex use.   4. HIV AIDS. Last CD4 count was 140, follows with Dr. [name]. Continue present medications requested to follow with ID closely.   5. Chronic pain. Patient was requesting refill of all of his pain medications he is currently on 3 different pain meds, 10 pills for one of the pain medications as below provided at this time and requested to follow with PCP.     Discharge Condition: Stable  Follow UP  Follow-up Information    Follow up with [facility] A, MD. Schedule an appointment as soon as possible for a visit in 3 days.   Specialty:  Orthopedic Surgery   Contact information:   [address] [place] [address] [phone]       Follow up with Bobby Rumpf, MD. Schedule an appointment as soon as possible for a visit in 1 week.   Specialty:  Infectious Diseases   Contact information:   [place] [address] [phone]       Follow up with PCP. Schedule an appointment as soon as possible for a visit in 2 days.       Consults obtained - Hand Surgery  Diet and Activity recommendation: See Discharge Instructions below  Discharge Instructions      Discharge Instructions    Discharge instructions    Complete by:  As directed   Follow with Primary MD in 7 days   Get CBC, CMP, 2 view Chest X ray checked  by Primary MD next visit.   Keep your Right hand clean and dry at all times.  Warm soapy soaks to right index finger with massaging of nailfold to allow drainage twice a day   Activity: As tolerated with Full fall precautions use walker/cane & assistance as needed   Disposition Home     Diet: Heart Healthy Low Carb.  Accuchecks 4 times/day, Once in AM empty stomach and then before each  meal. Log in all results and show them to your Prim.MD in 3 days. If any glucose reading is under 80 or above 300 call your Prim MD immidiately. Follow Low glucose instructions for glucose under 80 as instructed.    For Heart failure patients - Check your Weight same time everyday, if you gain over 2 pounds, or you develop in leg swelling, experience more  shortness of breath or chest pain, call your Primary MD immediately. Follow Cardiac Low Salt Diet and 1.5 lit/day fluid restriction.   On your next visit with your primary care physician please Get Medicines reviewed and adjusted.   Please request your Prim.MD to go over all Hospital Tests and Procedure/Radiological results at the follow up, please get all Hospital records sent to your Prim MD by signing hospital release before you go home.   If you experience worsening of your admission symptoms, develop shortness of breath, life threatening emergency, suicidal or homicidal thoughts you must seek medical attention immediately by calling 911 or calling your MD immediately  if symptoms less severe.  You Must read complete instructions/literature along with all the possible adverse reactions/side effects for all the Medicines you take and that have been prescribed to you. Take any new Medicines after you have completely understood and accpet all the possible adverse reactions/side effects.   Do not drive, operating heavy machinery, perform activities at heights, swimming or participation in water activities or provide baby sitting services if your were admitted for syncope or siezures until you have seen by Primary MD or a Neurologist and advised to do so again.  Do not drive when taking Pain medications.    Do not take more than prescribed Pain, Sleep and Anxiety Medications  Special Instructions: If you have smoked or chewed Tobacco  in the last 2 yrs please stop smoking, stop any regular Alcohol  and or any Recreational drug use.  Wear  Seat belts while driving.   Please note  You were cared for by a hospitalist during your hospital stay. If you have any questions about your discharge medications or the care you received while you were in the hospital after you are discharged, you can call the unit and asked to speak with the hospitalist on call if the hospitalist that took care of you is not available. Once you are discharged, your primary care physician will handle any further medical issues. Please note that NO REFILLS for any discharge medications will be authorized once you are discharged, as it is imperative that you return to your primary care physician (or establish a relationship with a primary care physician if you do not have one) for your aftercare needs so that they can reassess your need for medications and monitor your lab values.     Increase activity slowly    Complete by:  As directed              Discharge Medications       Medication List    STOP taking these medications        cephALEXin 500 MG capsule  Commonly known as:  KEFLEX      TAKE these medications        aspirin 81 MG EC tablet  Take 81 mg by mouth daily.     dapsone 100 MG tablet  TAKE 1 TABLET BY MOUTH EVERY DAY. NEED APPOINTMENT     doxycycline 100 MG capsule  Commonly known as:  VIBRAMYCIN  Take 1 capsule (100 mg total) by mouth 2 (two) times daily.     elvitegravir-cobicistat-emtricitabine-tenofovir 150-150-200-10 MG Tabs tablet  Commonly known as:  GENVOYA  Take 1 tablet by mouth daily with breakfast.     HYDROcodone-acetaminophen 5-325 MG tablet  Commonly known as:  NORCO  Take 1-2 tablets by mouth every 6 (six) hours as needed.     imiquimod 5 % cream  Commonly known  as:  ALDARA  Apply topically 3 (three) times a week.     Insulin Glargine 100 UNIT/ML Solostar Pen  Commonly known as:  LANTUS SOLOSTAR  Inject 60 Units into the skin daily at 10 pm.     insulin lispro 100 UNIT/ML KiwkPen  Commonly known as:   HUMALOG  Inject 0.08 mLs (8 Units total) into the skin 3 (three) times daily.     LORazepam 1 MG tablet  Commonly known as:  ATIVAN  Take 1 tablet (1 mg total) by mouth every 6 (six) hours as needed for anxiety.     oxyCODONE-acetaminophen 5-325 MG tablet  Commonly known as:  PERCOCET  1-2 tabs po q6 hours prn pain     pravastatin 40 MG tablet  Commonly known as:  PRAVACHOL  Take 1 tablet (40 mg total) by mouth daily.     PREZISTA 800 MG tablet  Generic drug:  Darunavir Ethanolate  TAKE 1 TABLET BY MOUTH DAILY WITH BREAKFAST     traMADol 50 MG tablet  Commonly known as:  ULTRAM  Take 1 tablet (50 mg total) by mouth every 6 (six) hours as needed.        Major procedures and Radiology Reports - PLEASE review detailed and final reports for all details, in brief -     Dg Finger Index Right  03/08/2015  CLINICAL DATA:  Swelling and pain of the right index finger for 3 weeks. EXAM: RIGHT INDEX FINGER 2+V COMPARISON:  None. FINDINGS: There is no evidence of fracture or dislocation. There is no evidence of arthropathy or other focal bone abnormality. Soft tissues are unremarkable. IMPRESSION: Negative. Electronically Signed   By: Abelardo Diesel M.D.   On: 03/08/2015 20:15    Micro Results      No results found for this or any previous visit (from the past 240 hour(s)).     Today   Subjective    Keith [name] today has no headache,no chest abdominal pain,no new weakness tingling or numbness, feels much better wants to go home today.    Objective   Blood pressure 131/71, pulse 62, temperature 97.9 F (36.6 C), temperature source Oral, resp. rate 19, height 5\' 9"  (1.753 m), weight 129.7 kg (285 lb 15 oz), SpO2 100 %.   Intake/Output Summary (Last 24 hours) at 03/09/15 0909 Last data filed at 03/09/15 0600  Gross per 24 hour  Intake   4140 ml  Output      0 ml  Net   4140 ml    Exam Awake Alert, Oriented x 3, No new F.N deficits, Normal affect [place].AT,PERRAL Supple  Neck,No JVD, No cervical lymphadenopathy appriciated.  Symmetrical Chest wall movement, Good air movement bilaterally, CTAB RRR,No Gallops,Rubs or new Murmurs, No Parasternal Heave +ve B.Sounds, Abd Soft, Non tender, No organomegaly appriciated, No rebound -guarding or rigidity. No Cyanosis, Clubbing or edema, No new Rash or bruise, r index finger is swollen with mild warmth   Data Review   CBC w Diff: Lab Results  Component Value Date   WBC 4.8 03/09/2015   HGB 12.0* 03/09/2015   HCT 36.0* 03/09/2015   HCT 37.0* 05/31/2014   PLT 261 03/09/2015   LYMPHOPCT 32 03/08/2015   MONOPCT 6 03/08/2015   EOSPCT 1 03/08/2015   BASOPCT 0 03/08/2015    CMP: Lab Results  Component Value Date   NA 132* 03/09/2015   K 3.9 03/09/2015   CL 100* 03/09/2015   CO2 25 03/09/2015   BUN 10  03/09/2015   CREATININE 0.83 03/09/2015   CREATININE 0.84 01/06/2015   PROT 7.4 01/06/2015   ALBUMIN 3.9 01/06/2015   BILITOT 0.5 01/06/2015   ALKPHOS 79 01/06/2015   AST 17 01/06/2015   ALT 11 01/06/2015  .   Total Time in preparing paper work, data evaluation and todays exam - 35 minutes  Thurnell Lose M.D on 03/09/2015 at 9:09 AM  [facility]   Office  [phone]

## 2015-03-10 LAB — HEMOGLOBIN A1C
Hgb A1c MFr Bld: 15.3 % — ABNORMAL HIGH (ref 4.8–5.6)
Mean Plasma Glucose: 392 mg/dL

## 2015-03-10 LAB — GLUCOSE, CAPILLARY
Glucose-Capillary: 398 mg/dL — ABNORMAL HIGH (ref 65–99)
Glucose-Capillary: 299 mg/dL — ABNORMAL HIGH (ref 65–99)

## 2015-03-16 IMAGING — CT CT NECK W/ CM
4 of 5 series · 14 of 33 positions shown, 16 images · IV contrast (Omni 300)
Comparison: None.

CLINICAL DATA: Right lateral neck mass or abscess.

EXAM:
CT NECK WITH CONTRAST
TECHNIQUE: Multidetector CT imaging of the neck was performed using the
standard protocol following the bolus administration of intravenous
contrast.
CONTRAST:  75mL OMNIPAQUE IOHEXOL 300 MG/ML  SOLN

[Series 2: neck 2.0 i31s 3 · axial · 0.59mm/px · z∈[-258,-208]mm · 2 of 126 slices shown]
[im 26/126  bone]
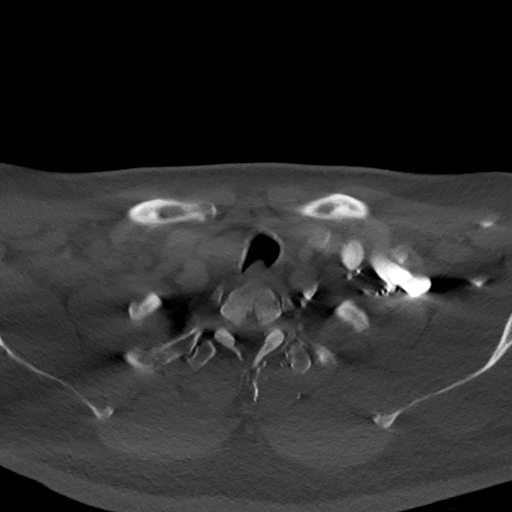
[im 51/126  bone]
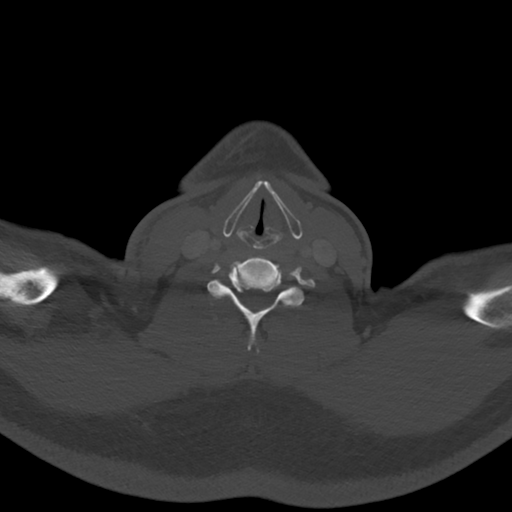

[Series 5: coronal st · coronal · 0.54mm/px · 3 of 143 slices shown]
[im 29/143  bone]
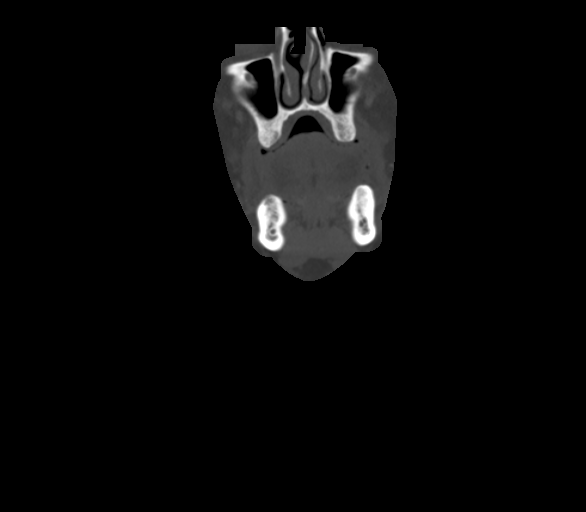
[im 57/143  bone]
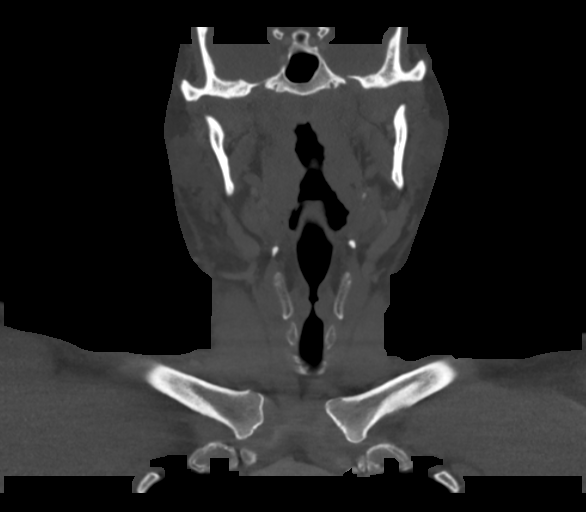
[im 86/143  bone]
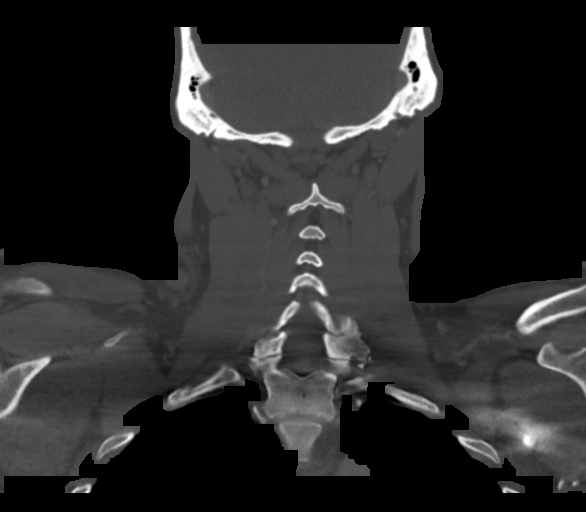

[Series 6: sagittal st · sagittal · 0.45mm/px · 5 of 152 slices shown, 6 images]
[im 51/152  bone]
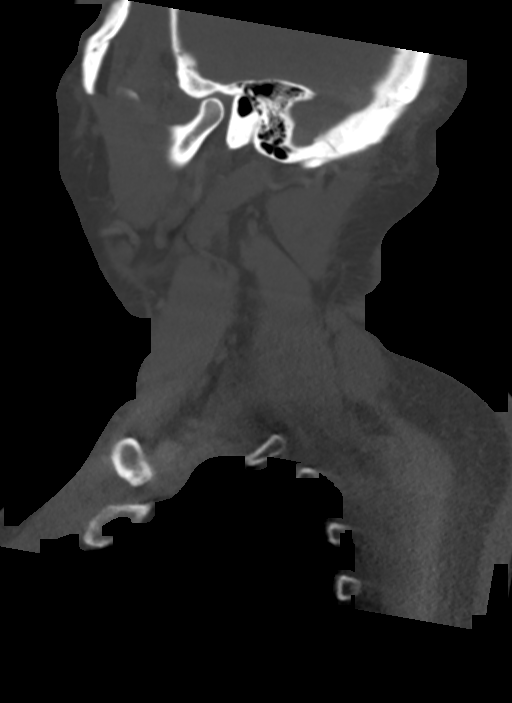
[im 63/152  bone]
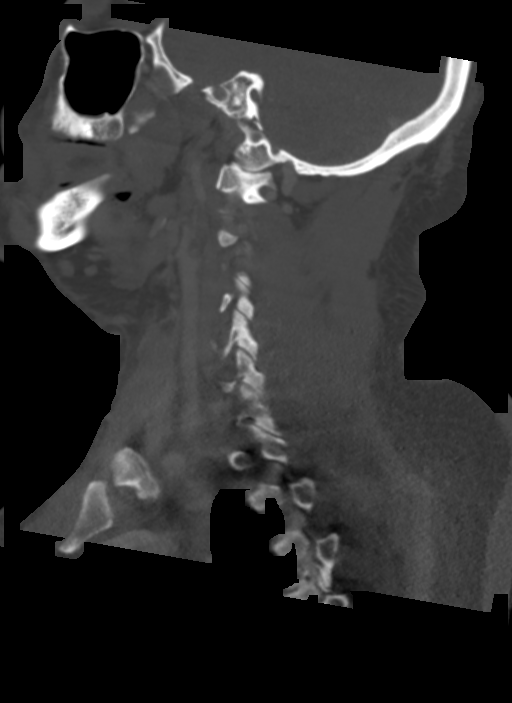
[im 76/152  soft-tissue]
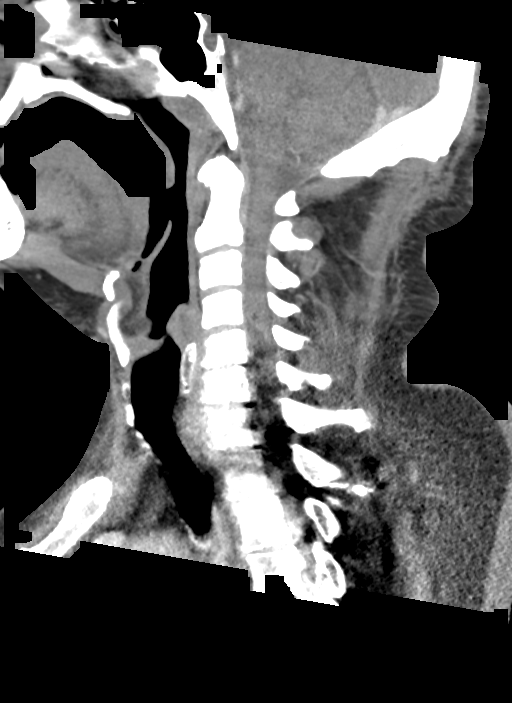
[im 76/152  bone]
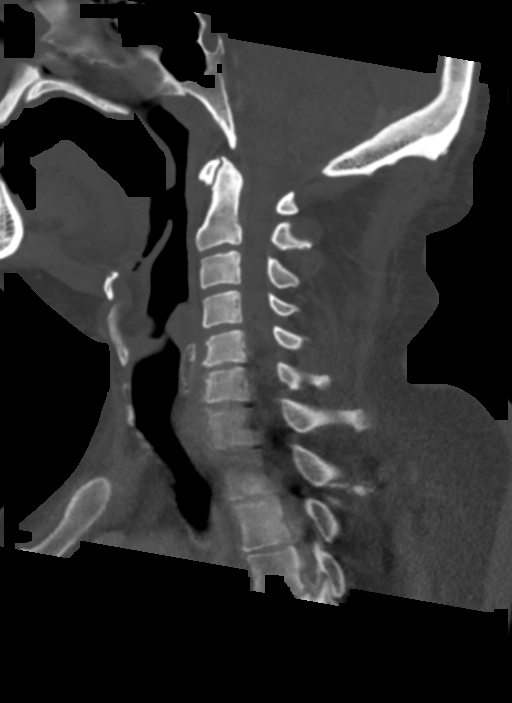
[im 89/152  bone]
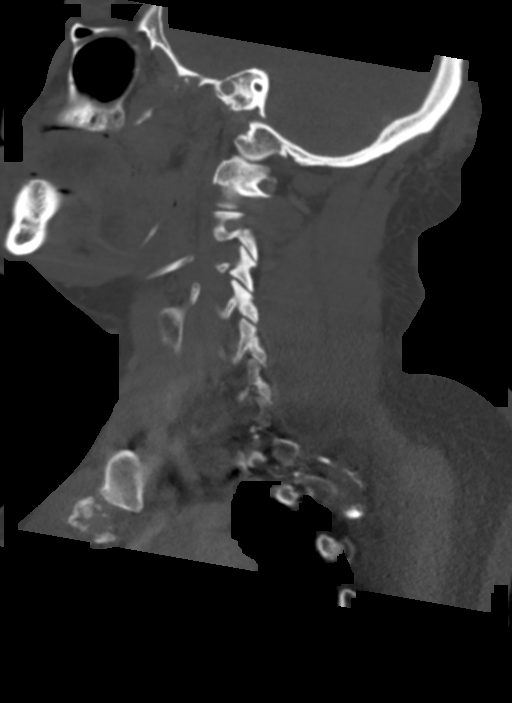
[im 101/152  bone]
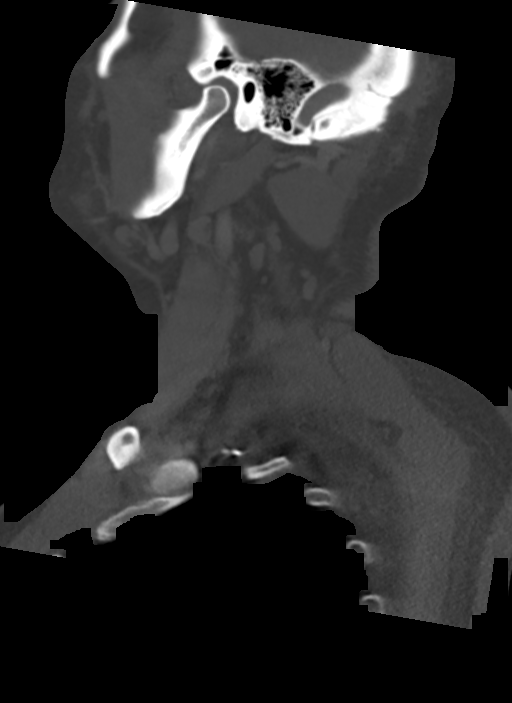

[Series 7: orthogonal st · axial · 0.54mm/px · z∈[-283,-117]mm · 4 of 140 slices shown, 5 images]
[im 28/140  soft-tissue]
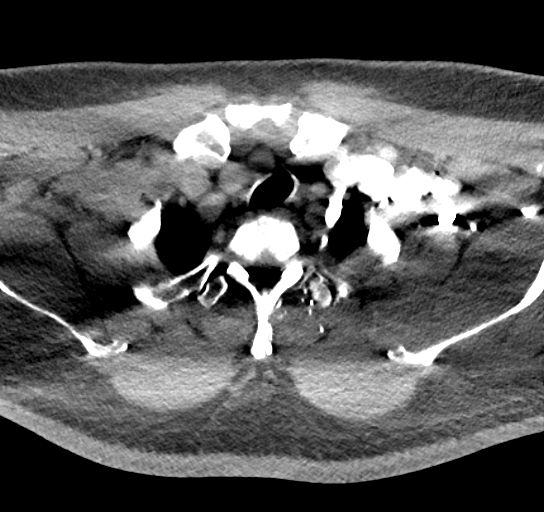
[im 28/140  bone]
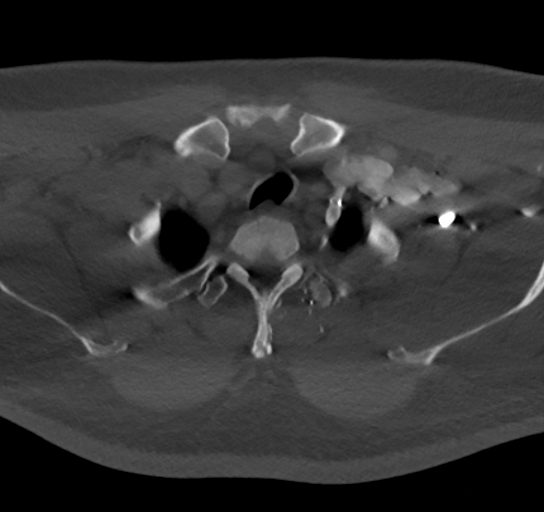
[im 56/140  bone]
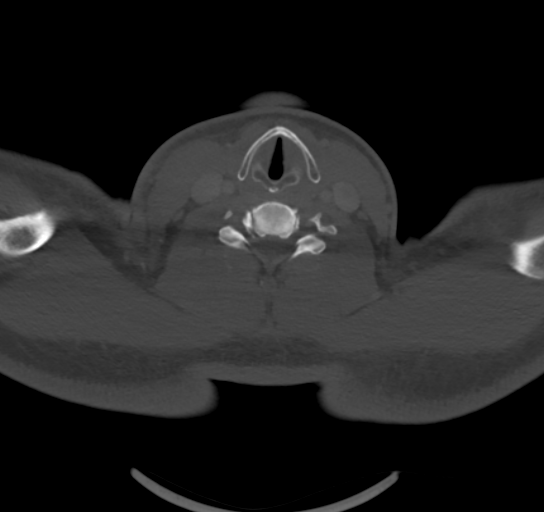
[im 84/140  bone]
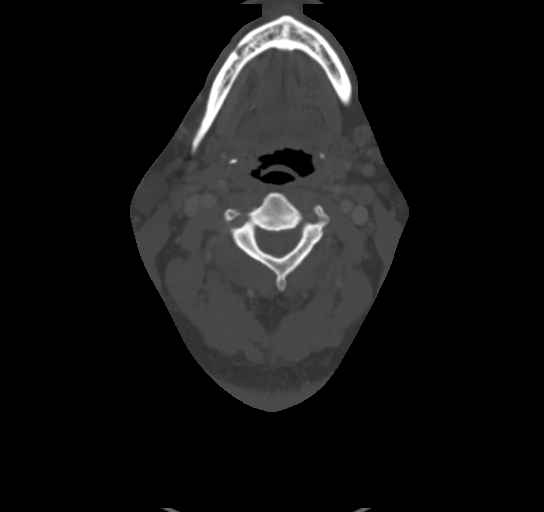
[im 112/140  bone]
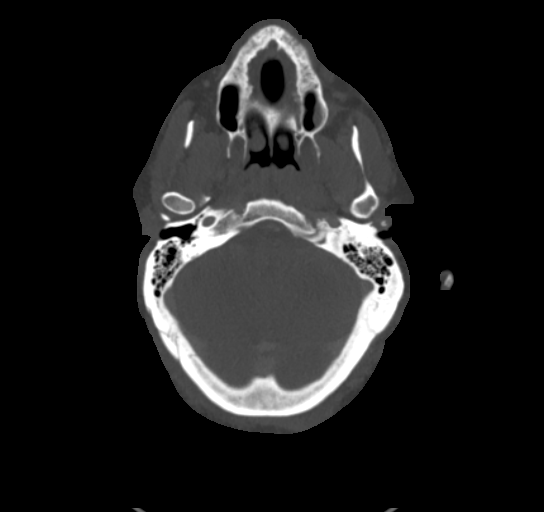

[14 of 33 positions shown; findings below may reference images not displayed]

FINDINGS: Pharynx and larynx: Normal.

Salivary glands: Normal.

Thyroid: Normal.

Lymph nodes: Mildly enlarged lymph nodes are noted bilaterally in
the level 2 areas, with the largest measuring 20 x 15 mm in the
right submandibular region. 16 x 14 mm lymph node is noted lateral
to the right carotid bifurcation.

Vascular: No abnormality seen.

Limited intracranial: No abnormality seen.

Mastoids and visualized paranasal sinuses: No abnormality seen.

Skeleton: No abnormality seen.

Upper chest: Normal.

Inflammatory changes and skin thickening are seen overlying the
right sternocleidomastoid muscle consistent with focal cellulitis.
No abscess is noted.
IMPRESSION: Probable cellulitis seen involving the subcutaneous tissues
overlying the right sternocleidomastoid muscle. No definite fluid
collection or abscess is seen. Significantly enlarged lymph nodes
are noted in the right level 2 and submandibular regions most likely
inflammatory in origin given the above findings.

## 2015-04-11 ENCOUNTER — Encounter: Payer: Self-pay | Admitting: Infectious Diseases

## 2015-04-11 ENCOUNTER — Ambulatory Visit (INDEPENDENT_AMBULATORY_CARE_PROVIDER_SITE_OTHER): Payer: Medicare Other | Admitting: Infectious Diseases

## 2015-04-11 VITALS — BP 136/93 | HR 94 | Temp 98.0°F | Wt 311.0 lb

## 2015-04-11 DIAGNOSIS — G8929 Other chronic pain: Secondary | ICD-10-CM

## 2015-04-11 DIAGNOSIS — Z79899 Other long term (current) drug therapy: Secondary | ICD-10-CM | POA: Diagnosis not present

## 2015-04-11 DIAGNOSIS — B20 Human immunodeficiency virus [HIV] disease: Secondary | ICD-10-CM

## 2015-04-11 DIAGNOSIS — Z113 Encounter for screening for infections with a predominantly sexual mode of transmission: Secondary | ICD-10-CM

## 2015-04-11 DIAGNOSIS — E1101 Type 2 diabetes mellitus with hyperosmolarity with coma: Secondary | ICD-10-CM

## 2015-04-11 DIAGNOSIS — L02511 Cutaneous abscess of right hand: Secondary | ICD-10-CM

## 2015-04-11 DIAGNOSIS — E1100 Type 2 diabetes mellitus with hyperosmolarity without nonketotic hyperglycemic-hyperosmolar coma (NKHHC): Secondary | ICD-10-CM

## 2015-04-11 MED ORDER — DARUNAVIR ETHANOLATE 800 MG PO TABS: 800.0000 mg | ORAL_TABLET | Freq: Every day | ORAL | Status: DC

## 2015-04-11 MED ORDER — ELVITEG-COBIC-EMTRICIT-TENOFAF 150-150-200-10 MG PO TABS: 1.0000 | ORAL_TABLET | Freq: Every day | ORAL | Status: DC

## 2015-04-11 NOTE — Assessment & Plan Note (Signed)
He asks multiple times for refill of his rx.  i asked him to f/u with his PCP.  He is advised to take tylenol alternating with advil. Use a heating pad.

## 2015-04-11 NOTE — Assessment & Plan Note (Signed)
Healing well.  Complete anbx as written.

## 2015-04-11 NOTE — Assessment & Plan Note (Signed)
His FSG have improved.  He will f/u with his PCP, have asked him to speak with PCP re: colonoscopy as well.

## 2015-04-11 NOTE — Progress Notes (Signed)
   Subjective:    [name], male    DOB: 02-07-1966, 51 y.o.   MRN: [name]  HPI 50 yo M with hx of HIV+ (1992) on DRVr/ISNTRV then changed to stribild and DM2.   HIV 1 RNA QUANT (copies/mL)  Date Value  01/06/2015 <20  07/06/2014 5368*  04/19/2014 284*   CD4 T CELL ABS (/uL)  Date Value  01/06/2015 140*  07/06/2014 120*  02/08/2014 150*    He was recently in hospital 11-30 to 12-1 with FSG > 700 (A1C 15) and infection of R 2nd digit. He was seen by hand and started on doxy. No Cx sent.  Has been getting pain rx from Dr Kennon Holter- oxycodone 10mg  but this has not been enough. He also complains of back and knee pain.  Wants to see new pain doctor. Took 8 advil at work today.  His FSG have been 150-170.  Has been doing a lot of walking, lost 18#, eating more salad.  Has gotten flu shot.   Review of Systems  Constitutional: Negative for appetite change and unexpected weight change.  Respiratory: Negative for shortness of breath.   Gastrointestinal: Negative for diarrhea and constipation.  Genitourinary: Negative for difficulty urinating.  Musculoskeletal: Positive for back pain and arthralgias.  Neurological: Negative for headaches.       Objective:   Physical Exam  Constitutional: He appears well-developed and well-nourished.  HENT:  Mouth/Throat: No oropharyngeal exudate.  Eyes: EOM are normal. Pupils are equal, round, and reactive to light.  Neck: Neck supple.  Cardiovascular: Normal rate, regular rhythm and normal heart sounds.   Pulmonary/Chest: Effort normal and breath sounds normal.  Abdominal: Soft. Bowel sounds are normal. There is no tenderness. There is no rebound.  Musculoskeletal:       Arms: Lymphadenopathy:    He has no cervical adenopathy.       Assessment & Plan:

## 2015-04-11 NOTE — Assessment & Plan Note (Signed)
He appears to be doing well.  His ART is re-written. He should be on DRV/genvoya.  He will rtc in 3 months Offered/refused condoms.  Has gotten flu shot.  pcp to address colon.  Check Hep B to see if he responded to vax

## 2015-04-20 DIAGNOSIS — G44209 Tension-type headache, unspecified, not intractable: Secondary | ICD-10-CM | POA: Diagnosis not present

## 2015-04-20 DIAGNOSIS — E109 Type 1 diabetes mellitus without complications: Secondary | ICD-10-CM | POA: Diagnosis not present

## 2015-04-20 DIAGNOSIS — Z79891 Long term (current) use of opiate analgesic: Secondary | ICD-10-CM | POA: Diagnosis not present

## 2015-04-20 DIAGNOSIS — B20 Human immunodeficiency virus [HIV] disease: Secondary | ICD-10-CM | POA: Diagnosis not present

## 2015-04-20 DIAGNOSIS — M545 Low back pain: Secondary | ICD-10-CM | POA: Diagnosis not present

## 2015-04-26 IMAGING — CR DG FINGER THUMB 2+V*R*
3 series · 3 of 3 positions shown · non-contrast
Comparison: None.

CLINICAL DATA: Wound on thumb

EXAM:
RIGHT THUMB 2+V

[finger ap]
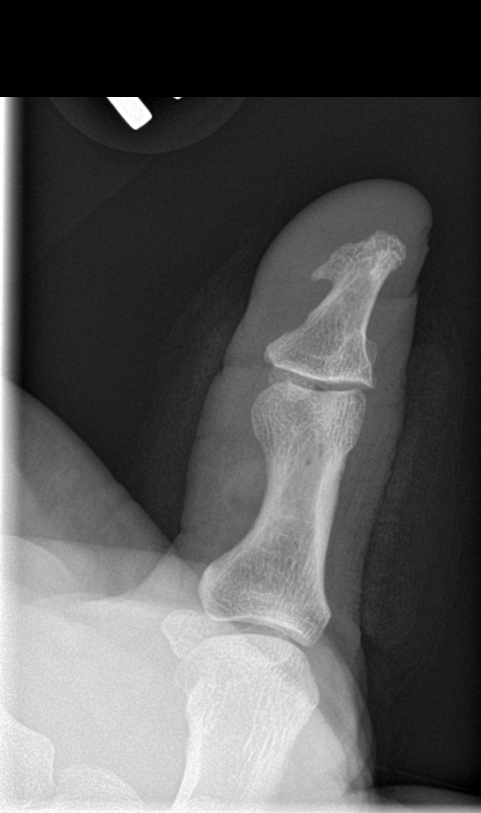

[finger obl]
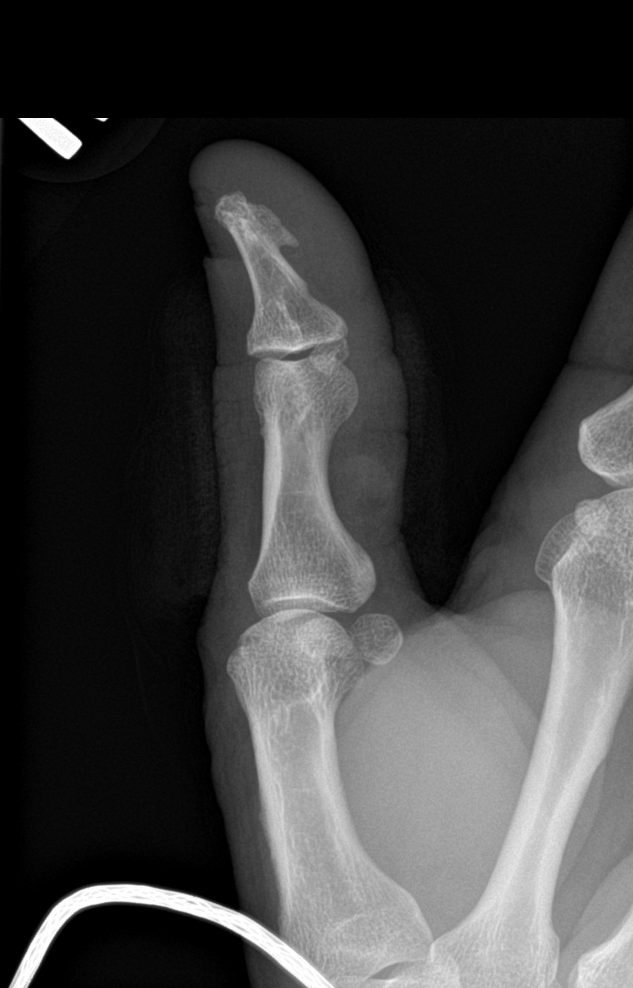

[finger lat]
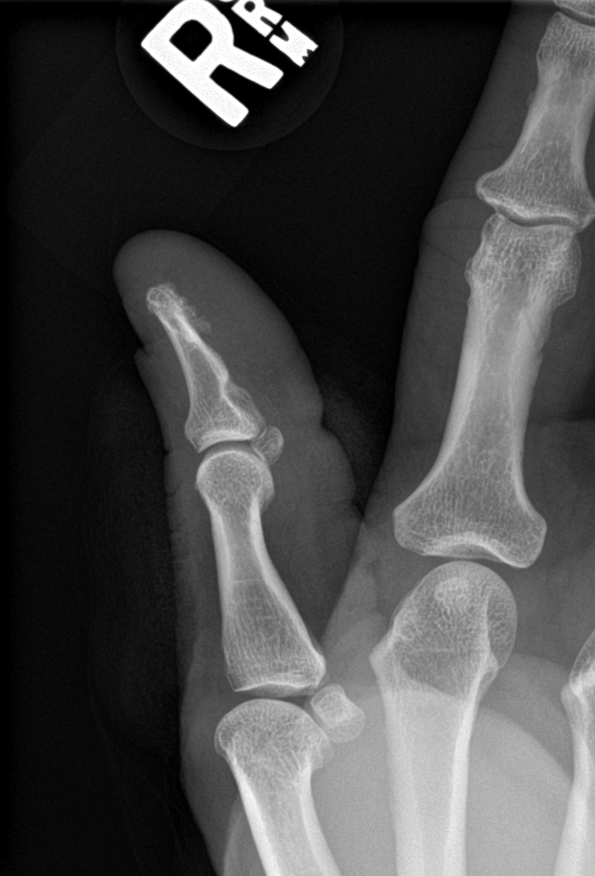

[3 of 3 positions shown; findings below may reference images not displayed]

FINDINGS: No acute fracture. No dislocation. No destructive bone lesion. There
is chronic appearing periosteum reaction along the tuft of the
distal phalanx of the thumb.
IMPRESSION: No acute bony pathology.  Chronic change.

## 2015-05-17 ENCOUNTER — Telehealth: Payer: Self-pay | Admitting: *Deleted

## 2015-05-17 NOTE — Telephone Encounter (Signed)
Patient came into clinic today c/o diarrhea since starting his new HIV meds, Genvoya and Prezista. He said he has been taking with food as directed. Please advise

## 2015-05-18 NOTE — Telephone Encounter (Signed)
Patient notified

## 2015-05-18 NOTE — Telephone Encounter (Signed)
Please ask him to take otc imodium 1 tab prior to medications

## 2015-05-22 DIAGNOSIS — E109 Type 1 diabetes mellitus without complications: Secondary | ICD-10-CM | POA: Diagnosis not present

## 2015-05-22 DIAGNOSIS — M545 Low back pain: Secondary | ICD-10-CM | POA: Diagnosis not present

## 2015-05-22 DIAGNOSIS — G44209 Tension-type headache, unspecified, not intractable: Secondary | ICD-10-CM | POA: Diagnosis not present

## 2015-05-22 DIAGNOSIS — B20 Human immunodeficiency virus [HIV] disease: Secondary | ICD-10-CM | POA: Diagnosis not present

## 2015-06-01 ENCOUNTER — Other Ambulatory Visit: Payer: Self-pay | Admitting: Pharmacist Clinician (PhC)/ Clinical Pharmacy Specialist

## 2015-06-01 MED ORDER — ABACAVIR-DOLUTEGRAVIR-LAMIVUD 600-50-300 MG PO TABS: 1.0000 | ORAL_TABLET | Freq: Every day | ORAL | Status: DC

## 2015-06-01 MED ORDER — DARUNAVIR-COBICISTAT 800-150 MG PO TABS: 1.0000 | ORAL_TABLET | Freq: Every day | ORAL | Status: DC

## 2015-06-01 NOTE — Progress Notes (Unsigned)
HPI: Keith [name] is a 50 y.o. male who just randomly walks in here to discuss his issue with diabetes, HIV, diarrhea.  Allergies: Allergies  Allergen Reactions  . Sulfa Antibiotics Itching    Vitals:    Past Medical History: Past Medical History  Diagnosis Date  . HIV (human immunodeficiency virus infection) ([place]) 2009    CD4 count 100, VL 13800 (05/01/2010)  . Diabetes type 2, uncontrolled ([place])     HgA1c 17.6 (04/27/2010)  . Hypertension   . Genital warts   . Erectile dysfunction   . Chronic knee pain     right  . Osteomyelitis ([place])     h/o hand  . Diabetes mellitus   . Chronic pain   . AIDS [facility])     Social History: Social History   Social History  . Marital Status: Single    Spouse Name: N/A  . Number of Children: 1  . Years of Education: 35   Social History Main Topics  . Smoking status: Never Smoker   . Smokeless tobacco: Never Used  . Alcohol Use: No  . Drug Use: No  . Sexual Activity:    Partners: Female     Comment: given condoms   Other Topics Concern  . Not on file   Social History Narrative   Worked for the city of [place] for 18 years.   Unemployed.    Applying for disability.   Medicaid patient.    Previous Regimen: Triumeq, DRV/r, TRV, ISN  Current Regimen: Genvoya/DRV  Labs: HIV 1 RNA QUANT (copies/mL)  Date Value  01/06/2015 <20  07/06/2014 5368*  04/19/2014 284*   CD4 T CELL ABS (/uL)  Date Value  01/06/2015 140*  07/06/2014 120*  02/08/2014 150*   HEP B S AB (no units)  Date Value  11/20/2012 NEG   HEPATITIS B SURFACE AG (no units)  Date Value  11/21/2005 negative   HCV AB (no units)  Date Value  07/18/2008 negative    CrCl: CrCl cannot be calculated (Patient has no serum creatinine result on file.).  Lipids:    Component Value Date/Time   CHOL 282* 07/06/2014 1101   TRIG 123 07/06/2014 1101   HDL 41 07/06/2014 1101   CHOLHDL 6.9 07/06/2014 1101   VLDL 25 07/06/2014 1101   LDLCALC 216*  07/06/2014 1101   HIV Genotype Composite Data Genotype Dates:   Mutations in Bold impact drug susceptibility RT Mutations M184V  PI Mutations   Integrase Mutations    Interpretation of Genotype Data per [employer] HIV Database Nucleoside RTIs  abacavir (ABC) Low-Level Resistance zidovudine (AZT) Susceptible emtricitabine (FTC) High-Level Resistance lamivudine (3TC) High-Level Resistance tenofovir (TDF) Susceptible   Non-Nucleoside RTIs     Protease Inhibitors     Integrase Inhibitors      Assessment: [name] just walked into the clinic with some issues. One of the CNAs asked to see him about his issue. He has been seeing [facility] internal medicine to manage his DM. He is currently on Lantus (70units/day) and Novolog for his meals coverage. Epic says he supposed to be on 60 units of lantus but he has been taking 70 units/day. The main issue for why he is here is because of the chronic diarrhea issue that he is having. He stated that he has 3 episodes a day and it has been going on since starting Genvoya. He has tried OTC anti-diarrheal a and it's not working. His current regimen is Genvoya/DRV. My guess is that this is  due to the [facility] since he has been on DRV before without issue. D/w the case quickly with Dr. Megan Salon, we are going to change his regimen to Prez/Triumeq  Recommendations:  Dc Genvoya/DRV Start Prezcobix 1 PO qday Start Triumeq 1 PO qday F/u with Dr. Eartha Inch, Annia Belt, PharmD Clinical Infectious [facility] 06/01/2015, 11:07 AM

## 2015-06-16 DIAGNOSIS — B20 Human immunodeficiency virus [HIV] disease: Secondary | ICD-10-CM | POA: Diagnosis not present

## 2015-06-16 DIAGNOSIS — E109 Type 1 diabetes mellitus without complications: Secondary | ICD-10-CM | POA: Diagnosis not present

## 2015-06-16 DIAGNOSIS — M545 Low back pain: Secondary | ICD-10-CM | POA: Diagnosis not present

## 2015-06-16 DIAGNOSIS — G44209 Tension-type headache, unspecified, not intractable: Secondary | ICD-10-CM | POA: Diagnosis not present

## 2015-07-03 ENCOUNTER — Ambulatory Visit: Payer: Medicare Other | Admitting: Infectious Diseases

## 2015-07-16 ENCOUNTER — Encounter (HOSPITAL_COMMUNITY): Payer: Self-pay | Admitting: *Deleted

## 2015-07-16 ENCOUNTER — Emergency Department (HOSPITAL_COMMUNITY)
Admission: EM | Admit: 2015-07-16 | Discharge: 2015-07-16 | Disposition: A | Discharge status: Home / Self Care | Payer: Medicare Other | Attending: Emergency Medicine | Admitting: Emergency Medicine

## 2015-07-16 DIAGNOSIS — Z87438 Personal history of other diseases of male genital organs: Secondary | ICD-10-CM | POA: Diagnosis not present

## 2015-07-16 DIAGNOSIS — Z794 Long term (current) use of insulin: Secondary | ICD-10-CM | POA: Diagnosis not present

## 2015-07-16 DIAGNOSIS — B20 Human immunodeficiency virus [HIV] disease: Secondary | ICD-10-CM | POA: Insufficient documentation

## 2015-07-16 DIAGNOSIS — Z79891 Long term (current) use of opiate analgesic: Secondary | ICD-10-CM | POA: Diagnosis not present

## 2015-07-16 DIAGNOSIS — Z7982 Long term (current) use of aspirin: Secondary | ICD-10-CM | POA: Insufficient documentation

## 2015-07-16 DIAGNOSIS — R739 Hyperglycemia, unspecified: Secondary | ICD-10-CM

## 2015-07-16 DIAGNOSIS — E1165 Type 2 diabetes mellitus with hyperglycemia: Secondary | ICD-10-CM | POA: Insufficient documentation

## 2015-07-16 DIAGNOSIS — I10 Essential (primary) hypertension: Secondary | ICD-10-CM | POA: Diagnosis not present

## 2015-07-16 DIAGNOSIS — Z792 Long term (current) use of antibiotics: Secondary | ICD-10-CM | POA: Insufficient documentation

## 2015-07-16 DIAGNOSIS — G8929 Other chronic pain: Secondary | ICD-10-CM | POA: Diagnosis not present

## 2015-07-16 DIAGNOSIS — Z79899 Other long term (current) drug therapy: Secondary | ICD-10-CM | POA: Insufficient documentation

## 2015-07-16 DIAGNOSIS — Z8739 Personal history of other diseases of the musculoskeletal system and connective tissue: Secondary | ICD-10-CM | POA: Insufficient documentation

## 2015-07-16 LAB — BASIC METABOLIC PANEL
Anion gap: 11 (ref 5–15)
BUN: 12 mg/dL (ref 6–20)
CO2: 25 mmol/L (ref 22–32)
Calcium: 9.5 mg/dL (ref 8.9–10.3)
Chloride: 96 mmol/L — ABNORMAL LOW (ref 101–111)
Creatinine, Ser: 1.09 mg/dL (ref 0.61–1.24)
GFR calc Af Amer: >60 mL/min (ref >60)
GFR calc non Af Amer: >60 mL/min (ref >60)
Glucose, Bld: 460 mg/dL — ABNORMAL HIGH (ref 65–99)
Potassium: 3.9 mmol/L (ref 3.5–5.1)
Sodium: 132 mmol/L — ABNORMAL LOW (ref 135–145)

## 2015-07-16 LAB — URINALYSIS, ROUTINE W REFLEX MICROSCOPIC
Bilirubin Urine: NEGATIVE
Glucose, UA: >1000 mg/dL — AB
Ketones, ur: NEGATIVE mg/dL
Leukocytes, UA: NEGATIVE
Nitrite: NEGATIVE
Protein, ur: 30 mg/dL — AB
Specific Gravity, Urine: 1.037 — ABNORMAL HIGH (ref 1.005–1.030)
pH: 5 (ref 5.0–8.0)

## 2015-07-16 LAB — CBG MONITORING, ED
Glucose-Capillary: 455 mg/dL — ABNORMAL HIGH (ref 65–99)
Glucose-Capillary: 362 mg/dL — ABNORMAL HIGH (ref 65–99)

## 2015-07-16 LAB — CBC
HCT: 37.3 % — ABNORMAL LOW (ref 39.0–52.0)
Hemoglobin: 12.2 g/dL — ABNORMAL LOW (ref 13.0–17.0)
MCH: 26.3 pg (ref 26.0–34.0)
MCHC: 32.7 g/dL (ref 30.0–36.0)
MCV: 80.4 fL (ref 78.0–100.0)
Platelets: 274 10*3/uL (ref 150–400)
RBC: 4.64 MIL/uL (ref 4.22–5.81)
RDW: 13.5 % (ref 11.5–15.5)
WBC: 6.8 10*3/uL (ref 4.0–10.5)

## 2015-07-16 LAB — URINE MICROSCOPIC-ADD ON: Bacteria, UA: NONE SEEN

## 2015-07-16 MED ORDER — INSULIN ASPART 100 UNIT/ML ~~LOC~~ SOLN
10.0000 [IU] | Freq: Once | SUBCUTANEOUS | Status: AC
  Administered 2015-07-16: 10 [IU] via SUBCUTANEOUS
  Filled 2015-07-16: qty 1

## 2015-07-16 MED ORDER — SODIUM CHLORIDE 0.9 % IV BOLUS (SEPSIS)
1000.0000 mL | Freq: Once | INTRAVENOUS | Status: AC
  Administered 2015-07-16: 1000 mL via INTRAVENOUS

## 2015-07-16 NOTE — Discharge Instructions (Signed)
Hyperglycemia °Hyperglycemia occurs when the glucose (sugar) in your blood is too high. Hyperglycemia can happen for many reasons, but it most often happens to people who do not know they have diabetes or are not managing their diabetes properly.  °CAUSES  °Whether you have diabetes or not, there are other causes of hyperglycemia. Hyperglycemia can occur when you have diabetes, but it can also occur in other situations that you might not be as aware of, such as: °Diabetes °· If you have diabetes and are having problems controlling your blood glucose, hyperglycemia could occur because of some of the following reasons: °¨ Not following your meal plan. °¨ Not taking your diabetes medications or not taking it properly. °¨ Exercising less or doing less activity than you normally do. °¨ Being sick. °Pre-diabetes °· This cannot be ignored. Before people develop Type 2 diabetes, they almost always have "pre-diabetes." This is when your blood glucose levels are higher than normal, but not yet high enough to be diagnosed as diabetes. Research has shown that some long-term damage to the body, especially the heart and circulatory system, may already be occurring during pre-diabetes. If you take action to manage your blood glucose when you have pre-diabetes, you may delay or prevent Type 2 diabetes from developing. °Stress °· If you have diabetes, you may be "diet" controlled or on oral medications or insulin to control your diabetes. However, you may find that your blood glucose is higher than usual in the hospital whether you have diabetes or not. This is often referred to as "stress hyperglycemia." Stress can elevate your blood glucose. This happens because of hormones put out by the body during times of stress. If stress has been the cause of your high blood glucose, it can be followed regularly by your caregiver. That way he/she can make sure your hyperglycemia does not continue to get worse or progress to  diabetes. °Steroids °· Steroids are medications that act on the infection fighting system (immune system) to block inflammation or infection. One side effect can be a rise in blood glucose. Most people can produce enough extra insulin to allow for this rise, but for those who cannot, steroids make blood glucose levels go even higher. It is not unusual for steroid treatments to "uncover" diabetes that is developing. It is not always possible to determine if the hyperglycemia will go away after the steroids are stopped. A special blood test called an A1c is sometimes done to determine if your blood glucose was elevated before the steroids were started. °SYMPTOMS °· Thirsty. °· Frequent urination. °· Dry mouth. °· Blurred vision. °· Tired or fatigue. °· Weakness. °· Sleepy. °· Tingling in feet or leg. °DIAGNOSIS  °Diagnosis is made by monitoring blood glucose in one or all of the following ways: °· A1c test. This is a chemical found in your blood. °· Fingerstick blood glucose monitoring. °· Laboratory results. °TREATMENT  °First, knowing the cause of the hyperglycemia is important before the hyperglycemia can be treated. Treatment may include, but is not be limited to: °· Education. °· Change or adjustment in medications. °· Change or adjustment in meal plan. °· Treatment for an illness, infection, etc. °· More frequent blood glucose monitoring. °· Change in exercise plan. °· Decreasing or stopping steroids. °· Lifestyle changes. °HOME CARE INSTRUCTIONS  °· Test your blood glucose as directed. °· Exercise regularly. Your caregiver will give you instructions about exercise. Pre-diabetes or diabetes which comes on with stress is helped by exercising. °· Eat wholesome,   balanced meals. Eat often and at regular, fixed times. Your caregiver or nutritionist will give you a meal plan to guide your sugar intake. °· Being at an ideal weight is important. If needed, losing as little as 10 to 15 pounds may help improve blood  glucose levels. °SEEK MEDICAL CARE IF:  °· You have questions about medicine, activity, or diet. °· You continue to have symptoms (problems such as increased thirst, urination, or weight gain). °SEEK IMMEDIATE MEDICAL CARE IF:  °· You are vomiting or have diarrhea. °· Your breath smells fruity. °· You are breathing faster or slower. °· You are very sleepy or incoherent. °· You have numbness, tingling, or pain in your feet or hands. °· You have chest pain. °· Your symptoms get worse even though you have been following your caregiver's orders. °· If you have any other questions or concerns. °  °This information is not intended to replace advice given to you by your health care provider. Make sure you discuss any questions you have with your health care provider. °  °Document Released: 09/18/2000 Document Revised: 06/17/2011 Document Reviewed: 11/29/2014 °Elsevier Interactive Patient Education ©2016 [employer] ° °

## 2015-07-16 NOTE — ED Notes (Addendum)
CBG was 455, RN notified

## 2015-07-16 NOTE — ED Notes (Signed)
Pt left with all his belongings and ambulated out of the treatment area.  

## 2015-07-16 NOTE — ED Notes (Signed)
The pt is c/o his glucose being high for 2 days. No pain anywhere just feeling weak  No n v

## 2015-07-16 NOTE — ED Provider Notes (Signed)
CSN: [id number]     Arrival date & time 07/16/15  0143 History  By signing my name below, I, Irene Pap, attest that this documentation has been prepared under the direction and in the presence of Orpah Greek, MD. Electronically Signed: Irene Pap, ED Scribe. 07/16/2015. 3:43 AM.  Chief Complaint  Patient presents with  . Hyperglycemia   The history is provided by the patient. No language interpreter was used.  HPI Comments: Keith [name] is a 50 y.o. Male with a hx of HIV, Type II DM, HTN, and AIDS who presents to the Emergency Department complaining of increased blood sugar levels onset 2 days ago. Pt reports that he uses insulin but has eaten candy recently. Pt's current CBG is 455. He denies nausea, vomiting, diarrhea, abdominal pain, or SOB.   Past Medical History  Diagnosis Date  . HIV (human immunodeficiency virus infection) ([place]) 2009    CD4 count 100, VL 13800 (05/01/2010)  . Diabetes type 2, uncontrolled ([place])     HgA1c 17.6 (04/27/2010)  . Hypertension   . Genital warts   . Erectile dysfunction   . Chronic knee pain     right  . Osteomyelitis ([place])     h/o hand  . Diabetes mellitus   . Chronic pain   . AIDS [facility])    Past Surgical History  Procedure Laterality Date  . Multiple extractions with alveoloplasty N/A 01/18/2013    Procedure: MULTIPLE EXTRACION 3, 6, 7, 10, 11, 13, 21, 22, 27, 28, 29, 30 WITH ALVEOLOPLASTY;  Surgeon: Gae Bon, DDS;  Location: [facility];  Service: Oral Surgery;  Laterality: N/A;  . Hernia repair    . Minor irrigation and debridement of wound Right 04/22/2014    Procedure: IRRIGATION AND DEBRIDEMENT OF RIGHT NECK ABCESS;  Surgeon: Jerrell Belfast, MD;  Location: [facility];  Service: ENT;  Laterality: Right;  . I&d extremity Left 08/21/2014    Procedure: INCISION AND DRAINAGE LEFT SMALL FINGER;  Surgeon: Leanora Cover, MD;  Location: [facility];  Service: Orthopedics;  Laterality: Left;   Family History  Problem Relation Age of Onset  .  Hypertension Mother   . Arthritis Father   . Hypertension Father   . Hypertension Brother   . Cancer Maternal Grandmother 31    unknown type of cancer  . Depression Paternal Grandmother    Social History  Substance Use Topics  . Smoking status: Never Smoker   . Smokeless tobacco: Never Used  . Alcohol Use: No    Review of Systems  Gastrointestinal: Negative for nausea, vomiting and diarrhea.  All other systems reviewed and are negative.     Allergies  Sulfa antibiotics  Home Medications   Prior to Admission medications   Medication Sig Start Date End Date Taking? Authorizing Provider  abacavir-dolutegravir-lamiVUDine (TRIUMEQ) 600-50-300 MG tablet Take 1 tablet by mouth daily. 06/01/15   [name], MD  aspirin 81 MG EC tablet Take 81 mg by mouth daily.  08/20/11   [name], MD  dapsone 100 MG tablet TAKE 1 TABLET BY MOUTH EVERY DAY. NEED APPOINTMENT 09/19/14   [name], MD  darunavir-cobicistat (PREZCOBIX) 800-150 MG tablet Take 1 tablet by mouth daily. Take with food. 06/01/15   [name], MD  doxycycline (VIBRAMYCIN) 100 MG capsule Take 1 capsule (100 mg total) by mouth 2 (two) times daily. 03/09/15   Thurnell Lose, MD  HYDROcodone-acetaminophen (NORCO) 5-325 MG tablet Take 1-2 tablets by mouth every 6 (  six) hours as needed. 03/09/15   Thurnell Lose, MD  imiquimod (ALDARA) 5 % cream Apply topically 3 (three) times a week. 01/30/15   [name], MD  Insulin Glargine (LANTUS SOLOSTAR) 100 UNIT/ML Solostar Pen Inject 60 Units into the skin daily at 10 pm. 06/01/14   [name], MD  insulin lispro (HUMALOG) 100 UNIT/ML KiwkPen Inject 0.08 mLs (8 Units total) into the skin 3 (three) times daily. Patient taking differently: Inject 7 Units into the skin 3 (three) times daily.  02/21/14   [name], MD  LORazepam (ATIVAN) 1 MG tablet Take 1 tablet (1 mg total) by mouth every 6 (six) hours as needed for anxiety. 01/30/15   [name], MD  oxyCODONE-acetaminophen [facility]) 5-325 MG per tablet 1-2 tabs po q6 hours prn pain 08/21/14   Leanora Cover, MD  pravastatin (PRAVACHOL) 40 MG tablet Take 1 tablet (40 mg total) by mouth daily. 01/03/14   [name], MD  traMADol (ULTRAM) 50 MG tablet Take 1 tablet (50 mg total) by mouth every 6 (six) hours as needed. Patient not taking: Reported on 03/09/2015 03/03/15   [name], PA-C   BP 140/83 mmHg  Pulse 100  Temp(Src) 98 F (36.7 C) (Oral)  Resp 18  Ht 5\' 9"  (1.753 m)  SpO2 99% Physical Exam  Constitutional: He is oriented to person, place, and time. He appears well-developed and well-nourished. No distress.  HENT:  Head: Normocephalic and atraumatic.  Right Ear: Hearing normal.  Left Ear: Hearing normal.  Nose: Nose normal.  Mouth/Throat: Oropharynx is clear and moist and mucous membranes are normal.  Eyes: Conjunctivae and EOM are normal. Pupils are equal, round, and reactive to light.  Neck: Normal range of motion. Neck supple.  Cardiovascular: Regular rhythm, S1 normal and S2 normal.  Exam reveals no gallop and no friction rub.   No murmur heard. Pulmonary/Chest: Effort normal and breath sounds normal. No respiratory distress. He exhibits no tenderness.  Abdominal: Soft. Normal appearance and bowel sounds are normal. There is no hepatosplenomegaly. There is no tenderness. There is no rebound, no guarding, no tenderness at McBurney's point and negative Murphy's sign. No hernia.  Musculoskeletal: Normal range of motion.  Neurological: He is alert and oriented to person, place, and time. He has normal strength. No cranial nerve deficit or sensory deficit. Coordination normal. GCS eye subscore is 4. GCS verbal subscore is 5. GCS motor subscore is 6.  Skin: Skin is warm, dry and intact. No rash noted. No cyanosis.  Psychiatric: He has a normal mood and affect. His speech is normal and behavior is normal. Thought content normal.  Nursing note and vitals  reviewed.   ED Course  Procedures (including critical care time) DIAGNOSTIC STUDIES:  by my interpretation.    COORDINATION OF CARE: 3:43 AM-Discussed treatment plan which includes labs and IV fluids with pt at bedside and pt agreed to plan.    Labs Review Labs Reviewed  BASIC METABOLIC PANEL - Abnormal; Notable for the following:    Sodium 132 (*)    Chloride 96 (*)    Glucose, Bld 460 (*)    All other components within normal limits  CBC - Abnormal; Notable for the following:    Hemoglobin 12.2 (*)    HCT 37.3 (*)    All other components within normal limits  URINALYSIS, ROUTINE W REFLEX MICROSCOPIC (NOT AT [facility]) - Abnormal; Notable for the following:    Specific Gravity, Urine 1.037 (*)  Glucose, UA >1000 (*)    Hgb urine dipstick SMALL (*)    Protein, ur 30 (*)    All other components within normal limits  URINE MICROSCOPIC-ADD ON - Abnormal; Notable for the following:    Squamous Epithelial / LPF 0-5 (*)    All other components within normal limits  CBG MONITORING, ED - Abnormal; Notable for the following:    Glucose-Capillary 455 (*)    All other components within normal limits    Imaging Review No results found. I have personally reviewed and evaluated these images and lab results as part of my medical decision-making.   EKG Interpretation None      MDM   Final diagnoses:  None   hyperglycemia  Patient presents to the emergency department for evaluation of elevated blood sugar. Patient denies concomitant symptoms. He has not had any recent illness. No nausea, vomiting, diarrhea, cough, fever. He has not had any chest pain or shortness of breath. Patient reports that he has been eating candy that is likely the cause of his elevated blood sugars.  Bloodwork is unremarkable. He shows hyperglycemia without diabetic ketoacidosis. Patient treated with IV fluids and insulin.  I personally performed the services described in this documentation, which was  scribed in my presence. The recorded information has been reviewed and is accurate.     Orpah Greek, MD 07/16/15 [phone]

## 2015-07-17 DIAGNOSIS — B20 Human immunodeficiency virus [HIV] disease: Secondary | ICD-10-CM | POA: Diagnosis not present

## 2015-07-17 DIAGNOSIS — M545 Low back pain: Secondary | ICD-10-CM | POA: Diagnosis not present

## 2015-07-17 DIAGNOSIS — E109 Type 1 diabetes mellitus without complications: Secondary | ICD-10-CM | POA: Diagnosis not present

## 2015-07-17 DIAGNOSIS — G44209 Tension-type headache, unspecified, not intractable: Secondary | ICD-10-CM | POA: Diagnosis not present

## 2015-08-16 DIAGNOSIS — M545 Low back pain: Secondary | ICD-10-CM | POA: Diagnosis not present

## 2015-08-16 DIAGNOSIS — B20 Human immunodeficiency virus [HIV] disease: Secondary | ICD-10-CM | POA: Diagnosis not present

## 2015-08-16 DIAGNOSIS — G44209 Tension-type headache, unspecified, not intractable: Secondary | ICD-10-CM | POA: Diagnosis not present

## 2015-08-16 DIAGNOSIS — E109 Type 1 diabetes mellitus without complications: Secondary | ICD-10-CM | POA: Diagnosis not present

## 2015-08-26 ENCOUNTER — Encounter (HOSPITAL_COMMUNITY): Payer: Self-pay | Admitting: Emergency Medicine

## 2015-08-26 ENCOUNTER — Emergency Department (HOSPITAL_COMMUNITY)
Admission: EM | Admit: 2015-08-26 | Discharge: 2015-08-26 | Disposition: A | Discharge status: Home / Self Care | Payer: Medicare Other | Attending: Emergency Medicine | Admitting: Emergency Medicine

## 2015-08-26 DIAGNOSIS — Z79899 Other long term (current) drug therapy: Secondary | ICD-10-CM | POA: Insufficient documentation

## 2015-08-26 DIAGNOSIS — B20 Human immunodeficiency virus [HIV] disease: Secondary | ICD-10-CM | POA: Insufficient documentation

## 2015-08-26 DIAGNOSIS — Z7982 Long term (current) use of aspirin: Secondary | ICD-10-CM | POA: Insufficient documentation

## 2015-08-26 DIAGNOSIS — Z87438 Personal history of other diseases of male genital organs: Secondary | ICD-10-CM | POA: Diagnosis not present

## 2015-08-26 DIAGNOSIS — G8929 Other chronic pain: Secondary | ICD-10-CM | POA: Diagnosis not present

## 2015-08-26 DIAGNOSIS — L84 Corns and callosities: Secondary | ICD-10-CM

## 2015-08-26 DIAGNOSIS — E119 Type 2 diabetes mellitus without complications: Secondary | ICD-10-CM | POA: Insufficient documentation

## 2015-08-26 DIAGNOSIS — I10 Essential (primary) hypertension: Secondary | ICD-10-CM | POA: Insufficient documentation

## 2015-08-26 DIAGNOSIS — Z8619 Personal history of other infectious and parasitic diseases: Secondary | ICD-10-CM | POA: Insufficient documentation

## 2015-08-26 DIAGNOSIS — Z8739 Personal history of other diseases of the musculoskeletal system and connective tissue: Secondary | ICD-10-CM | POA: Diagnosis not present

## 2015-08-26 DIAGNOSIS — Z794 Long term (current) use of insulin: Secondary | ICD-10-CM | POA: Insufficient documentation

## 2015-08-26 DIAGNOSIS — M79675 Pain in left toe(s): Secondary | ICD-10-CM | POA: Diagnosis present

## 2015-08-26 NOTE — ED Notes (Signed)
Pt states he had like laceration on his great toe left foot that is very painful when walking. Pt denies fever or chills.

## 2015-08-26 NOTE — Discharge Instructions (Signed)
Please read and follow all provided instructions.  Your diagnoses today include:  1. Pre-ulcerative corn or callous     Tests performed today include:  Vital signs. See below for your results today.   Medications prescribed:   None  Take any prescribed medications only as directed.  Home care instructions:   Follow any educational materials contained in this packet  Follow R.I.C.E. Protocol:  R - rest your injury   I  - use ice on injury without applying directly to skin  C - compress injury with bandage or splint  E - elevate the injury as much as possible  Follow-up instructions: Ask your primary care doctor about a podiatry referral when you see them on Monday.    Return instructions:   Please return if your toes or feet are numb or tingling, appear gray or blue, or you have severe pain (also elevate the leg and loosen splint or wrap if you were given one)  Please return to the Emergency Department if you experience worsening symptoms.   Please return if you have any other emergent concerns.  Additional Information:  Your vital signs today were: BP 140/102 mmHg   Pulse 87   Temp(Src) 97.9 F (36.6 C) (Oral)   Resp 20   Ht 5\' 9"  (1.753 m)   Wt 136.079 kg   BMI 44.28 kg/m2   SpO2 99% If your blood pressure (BP) was elevated above 135/85 this visit, please have this repeated by your doctor within one month. --------------

## 2015-08-26 NOTE — ED Provider Notes (Signed)
CSN: [id number]     Arrival date & time 08/26/15  [id number] History   First MD Initiated Contact with Patient 08/26/15 0413     Chief Complaint  Patient presents with  . Blister    great toe left food     (Consider location/radiation/quality/duration/timing/severity/associated sxs/prior Treatment) HPI Comments: Patient with history of HIV on antiretroviral therapy, diabetes on insulin -- presents with complaint of worsening pain at the base of his left great toe. Patient states that it is painful to walk. He states that he has been soaking his foot without relief. No other treatments. No fevers, chills or any drainage from the area. Patient denies acute injury. He states that he is taking his insulin. He has a PCP appointment in 2 days. Onset of symptoms acute. Course is constant.  The history is provided by the patient and medical records.    Past Medical History  Diagnosis Date  . HIV (human immunodeficiency virus infection) ([place]) 2009    CD4 count 100, VL 13800 (05/01/2010)  . Diabetes type 2, uncontrolled ([employer])     HgA1c 17.6 (04/27/2010)  . Hypertension   . Genital warts   . Erectile dysfunction   . Chronic knee pain     right  . Osteomyelitis ([place])     h/o hand  . Diabetes mellitus   . Chronic pain   . AIDS [facility])    Past Surgical History  Procedure Laterality Date  . Multiple extractions with alveoloplasty N/A 01/18/2013    Procedure: MULTIPLE EXTRACION 3, 6, 7, 10, 11, 13, 21, 22, 27, 28, 29, 30 WITH ALVEOLOPLASTY;  Surgeon: Gae Bon, DDS;  Location: [place];  Service: Oral Surgery;  Laterality: N/A;  . Hernia repair    . Minor irrigation and debridement of wound Right 04/22/2014    Procedure: IRRIGATION AND DEBRIDEMENT OF RIGHT NECK ABCESS;  Surgeon: Jerrell Belfast, MD;  Location[facility];  Service: ENT;  Laterality: Right;  . I&d extremity Left 08/21/2014    Procedure: INCISION AND DRAINAGE LEFT SMALL FINGER;  Surgeon: Leanora Cover, MD;  Location: [place];  Service:  Orthopedics;  Laterality: Left;   Family History  Problem Relation Age of Onset  . Hypertension Mother   . Arthritis Father   . Hypertension Father   . Hypertension Brother   . Cancer Maternal Grandmother 68    unknown type of cancer  . Depression Paternal Grandmother    Social History  Substance Use Topics  . Smoking status: Never Smoker   . Smokeless tobacco: Never Used  . Alcohol Use: No    Review of Systems  Constitutional: Negative for activity change.  Musculoskeletal: Negative for back pain, arthralgias, gait problem and neck pain.  Skin: Positive for wound.  Neurological: Negative for weakness and numbness.      Allergies  Sulfa antibiotics  Home Medications   Prior to Admission medications   Medication Sig Start Date End Date Taking? Authorizing Provider  abacavir-dolutegravir-lamiVUDine (TRIUMEQ) 600-50-300 MG tablet Take 1 tablet by mouth daily. 06/01/15   [name], MD  aspirin 81 MG EC tablet Take 81 mg by mouth daily.  08/20/11   [name], MD  dapsone 100 MG tablet TAKE 1 TABLET BY MOUTH EVERY DAY. NEED APPOINTMENT 09/19/14   [name], MD  darunavir-cobicistat (PREZCOBIX) 800-150 MG tablet Take 1 tablet by mouth daily. Take with food. 06/01/15   [name], MD  doxycycline (VIBRAMYCIN) 100 MG capsule Take 1 capsule (100 mg  total) by mouth 2 (two) times daily. Patient not taking: Reported on 07/16/2015 03/09/15   Thurnell Lose, MD  HYDROcodone-acetaminophen (NORCO) 5-325 MG tablet Take 1-2 tablets by mouth every 6 (six) hours as needed. 03/09/15   Thurnell Lose, MD  imiquimod (ALDARA) 5 % cream Apply topically 3 (three) times a week. 01/30/15   [name], MD  Insulin Glargine (LANTUS SOLOSTAR) 100 UNIT/ML Solostar Pen Inject 60 Units into the skin daily at 10 pm. 06/01/14   [name], MD  insulin lispro (HUMALOG) 100 UNIT/ML KiwkPen Inject 0.08 mLs (8 Units total) into the skin 3 (three) times daily. Patient taking  differently: Inject 7 Units into the skin 3 (three) times daily.  02/21/14   [name], MD  LORazepam (ATIVAN) 1 MG tablet Take 1 tablet (1 mg total) by mouth every 6 (six) hours as needed for anxiety. 01/30/15   [name], MD  oxyCODONE-acetaminophen [facility] 5-325 MG per tablet 1-2 tabs po q6 hours prn pain 08/21/14   Leanora Cover, MD  pravastatin (PRAVACHOL) 40 MG tablet Take 1 tablet (40 mg total) by mouth daily. 01/03/14   [name], MD  traMADol (ULTRAM) 50 MG tablet Take 1 tablet (50 mg total) by mouth every 6 (six) hours as needed. Patient not taking: Reported on 03/09/2015 03/03/15   [name], PA-C   BP 140/102 mmHg  Pulse 87  Temp(Src) 97.9 F (36.6 C) (Oral)  Resp 20  Ht 5\' 9"  (1.753 m)  Wt 136.079 kg  BMI 44.28 kg/m2  SpO2 99% Physical Exam  Constitutional: He appears well-developed and well-nourished.  HENT:  Head: Normocephalic and atraumatic.  Eyes: Conjunctivae are normal.  Neck: Normal range of motion. Neck supple.  Cardiovascular: Normal pulses.   Musculoskeletal: He exhibits tenderness. He exhibits no edema.  Neurological: He is alert. No sensory deficit.  Motor, sensation, and vascular distal to the injury is fully intact.   Skin: Skin is warm and dry.  Patient with generalized scaling of skin of his feet and ankles. There is a thickened area of skin consistent with callus at the base of his left toe on the plantar aspect of his foot. The skin is mildly disrupted along the crease of the toe. No redness or drainage or other signs of infection. Area is minimally tender when palpated.  Psychiatric: He has a normal mood and affect.  Nursing note and vitals reviewed.   ED Course  Procedures (including critical care time)  4:25 AM Patient seen and examined. No further treatment required tonight. Area is not infected. I encouraged the patient to ask his primary care physician for podiatry referral.  Vital signs reviewed and are as follows: BP  140/102 mmHg  Pulse 87  Temp(Src) 97.9 F (36.6 C) (Oral)  Resp 20  Ht 5\' 9"  (1.753 m)  Wt 136.079 kg  BMI 44.28 kg/m2  SpO2 99%    MDM   Final diagnoses:  Pre-ulcerative corn or callous   Patient with a thickened callus at the base of his left great toe which is causing him pain when he walks. There is some minor skin breakdown but no active drainage or signs of infection. Do not feel that antibiotics are indicated at this time. No concern for osteomyelitis. Patient would benefit from podiatry referral. Patient will see his primary care physician in 2 days for an appointment that is already scheduled.    Carlisle Cater, PA-C 08/26/15 [place], DO 08/26/15 [id number]

## 2015-08-29 ENCOUNTER — Ambulatory Visit (INDEPENDENT_AMBULATORY_CARE_PROVIDER_SITE_OTHER): Payer: Medicare Other | Admitting: Infectious Diseases

## 2015-08-29 ENCOUNTER — Encounter: Payer: Self-pay | Admitting: Infectious Diseases

## 2015-08-29 VITALS — BP 145/95 | HR 77 | Temp 97.7°F | Wt 264.0 lb

## 2015-08-29 DIAGNOSIS — E1165 Type 2 diabetes mellitus with hyperglycemia: Secondary | ICD-10-CM | POA: Diagnosis not present

## 2015-08-29 DIAGNOSIS — Z794 Long term (current) use of insulin: Secondary | ICD-10-CM

## 2015-08-29 DIAGNOSIS — A630 Anogenital (venereal) warts: Secondary | ICD-10-CM | POA: Diagnosis not present

## 2015-08-29 DIAGNOSIS — Z113 Encounter for screening for infections with a predominantly sexual mode of transmission: Secondary | ICD-10-CM | POA: Diagnosis not present

## 2015-08-29 DIAGNOSIS — Z79899 Other long term (current) drug therapy: Secondary | ICD-10-CM | POA: Diagnosis not present

## 2015-08-29 DIAGNOSIS — E1159 Type 2 diabetes mellitus with other circulatory complications: Secondary | ICD-10-CM | POA: Diagnosis not present

## 2015-08-29 DIAGNOSIS — B351 Tinea unguium: Secondary | ICD-10-CM | POA: Diagnosis not present

## 2015-08-29 DIAGNOSIS — B20 Human immunodeficiency virus [HIV] disease: Secondary | ICD-10-CM | POA: Diagnosis not present

## 2015-08-29 DIAGNOSIS — E1169 Type 2 diabetes mellitus with other specified complication: Secondary | ICD-10-CM | POA: Diagnosis not present

## 2015-08-29 DIAGNOSIS — N521 Erectile dysfunction due to diseases classified elsewhere: Secondary | ICD-10-CM

## 2015-08-29 LAB — LIPID PANEL
Cholesterol: 285 mg/dL — ABNORMAL HIGH (ref 125–200)
HDL: 37 mg/dL — ABNORMAL LOW (ref >=40)
LDL Cholesterol: 189 mg/dL — ABNORMAL HIGH (ref <130)
Total CHOL/HDL Ratio: 7.7 Ratio — ABNORMAL HIGH (ref <=5.0)
Triglycerides: 296 mg/dL — ABNORMAL HIGH (ref <150)
VLDL: 59 mg/dL — ABNORMAL HIGH (ref <30)

## 2015-08-29 LAB — CBC
HCT: 39.6 % (ref 38.5–50.0)
Hemoglobin: 13.1 g/dL — ABNORMAL LOW (ref 13.2–17.1)
MCH: 26.5 pg — ABNORMAL LOW (ref 27.0–33.0)
MCHC: 33.1 g/dL (ref 32.0–36.0)
MCV: 80.2 fL (ref 80.0–100.0)
MPV: 10.2 fL (ref 7.5–12.5)
Platelets: 242 10*3/uL (ref 140–400)
RBC: 4.94 MIL/uL (ref 4.20–5.80)
RDW: 14.4 % (ref 11.0–15.0)
WBC: 6.8 10*3/uL (ref 3.8–10.8)

## 2015-08-29 LAB — COMPREHENSIVE METABOLIC PANEL
ALT: 9 U/L (ref 9–46)
AST: 13 U/L (ref 10–35)
Albumin: 3.9 g/dL (ref 3.6–5.1)
Alkaline Phosphatase: 78 U/L (ref 40–115)
BUN: 12 mg/dL (ref 7–25)
CO2: 27 mmol/L (ref 20–31)
Calcium: 9.8 mg/dL (ref 8.6–10.3)
Chloride: 98 mmol/L (ref 98–110)
Creat: 0.89 mg/dL (ref 0.70–1.33)
Glucose, Bld: 330 mg/dL — ABNORMAL HIGH (ref 65–99)
Potassium: 3.9 mmol/L (ref 3.5–5.3)
Sodium: 134 mmol/L — ABNORMAL LOW (ref 135–146)
Total Bilirubin: 0.3 mg/dL (ref 0.2–1.2)
Total Protein: 7.5 g/dL (ref 6.1–8.1)

## 2015-08-29 NOTE — Assessment & Plan Note (Signed)
Worried about his callus. Will send him to podiatry

## 2015-08-29 NOTE — Assessment & Plan Note (Signed)
See above

## 2015-08-29 NOTE — Addendum Note (Signed)
Addended by: Dolan Amen D on: 08/29/2015 04:49 PM   Modules accepted: Orders

## 2015-08-29 NOTE — Assessment & Plan Note (Signed)
He appears to be doing well Will check his labs today.  Will see him back in 4 months.

## 2015-08-29 NOTE — Assessment & Plan Note (Signed)
He'll continue to f/u with his PCP.  He states he has good control.

## 2015-08-29 NOTE — Assessment & Plan Note (Signed)
Did not improve with aldara.  Will have him seen by derm.

## 2015-08-29 NOTE — Progress Notes (Signed)
   Subjective:    [name], male    DOB: 1965/06/18, 50 y.o.   MRN: [id number]  HPI   50 yo M with hx of HIV+ (1992) on DRVr/ISNTRV then changed to DRVc/Triumeq, and uncontrolled DM2.  Was in hospital 03-2015 with R 2nd finger abscess.  In ED 08-26-15 with callus on sole of L foot. Was sent out without atbx. Still having sharp pain on sole of foot.  Wants to know when we are going to take the "whelp off his penis" Has been taking ART without problem.  FSG have been 180-200s. Has been seeing Dr Davene Costain.   HIV 1 RNA QUANT (copies/mL)  Date Value  01/06/2015 <20  07/06/2014 5368*  04/19/2014 284*   CD4 T CELL ABS (/uL)  Date Value  01/06/2015 140*  07/06/2014 120*  02/08/2014 150*     Review of Systems  Constitutional: Negative for appetite change and unexpected weight change.  Eyes: Negative for visual disturbance.  Gastrointestinal: Negative for diarrhea and constipation.  Genitourinary: Negative for difficulty urinating.  wt down 36#     Objective:   Physical Exam  Constitutional: He appears well-developed and well-nourished.  HENT:  Mouth/Throat: No oropharyngeal exudate.  Eyes: EOM are normal. Pupils are equal, round, and reactive to light.  Neck: Neck supple.  Cardiovascular: Normal rate, regular rhythm and normal heart sounds.   Pulmonary/Chest: Effort normal and breath sounds normal.  Abdominal: Soft. Bowel sounds are normal. There is no tenderness. There is no rebound.  Genitourinary:     Musculoskeletal:       Feet:  Lymphadenopathy:    He has no cervical adenopathy.          Assessment & Plan:

## 2015-08-30 LAB — RPR

## 2015-08-30 LAB — HIV-1 RNA QUANT-NO REFLEX-BLD
HIV 1 RNA Quant: <20 copies/mL (ref <20)
HIV-1 RNA Quant, Log: <1.3 Log copies/mL (ref <1.30)

## 2015-08-31 ENCOUNTER — Telehealth: Payer: Self-pay | Admitting: *Deleted

## 2015-08-31 LAB — T-HELPER CELL (CD4) - (RCID CLINIC ONLY)
CD4 % Helper T Cell: 8 % — ABNORMAL LOW (ref 33–55)
CD4 T Cell Abs: 220 /uL — ABNORMAL LOW (ref 400–2700)

## 2015-08-31 NOTE — Telephone Encounter (Signed)
Patient notified of appt with [facility], Dr. Jacqualyn Posey on 09/22/15 at 2:00 pm. He is aware of address and phone # and did write this information down. He said he does have a GPS. Myrtis Hopping

## 2015-09-12 ENCOUNTER — Encounter: Payer: Self-pay | Admitting: *Deleted

## 2015-09-12 ENCOUNTER — Telehealth: Payer: Self-pay | Admitting: *Deleted

## 2015-09-12 DIAGNOSIS — B20 Human immunodeficiency virus [HIV] disease: Secondary | ICD-10-CM | POA: Diagnosis not present

## 2015-09-12 DIAGNOSIS — E109 Type 1 diabetes mellitus without complications: Secondary | ICD-10-CM | POA: Diagnosis not present

## 2015-09-12 DIAGNOSIS — M545 Low back pain: Secondary | ICD-10-CM | POA: Diagnosis not present

## 2015-09-12 DIAGNOSIS — G44209 Tension-type headache, unspecified, not intractable: Secondary | ICD-10-CM | POA: Diagnosis not present

## 2015-09-12 NOTE — Telephone Encounter (Signed)
Called patient x 3 to notify of appointment at [facility] for 11/09/15 at 2:40 pm. No answer and no voice mail set up. Office note, demographics, and med list faxed to [phone] and confirmation received. They do charge a $30 no show fee. I will mail patient this appt info and also attempt to call him prior to appt, as their office does not send a new patient packet. They do call 2 days prior to visit. Myrtis Hopping

## 2015-09-15 ENCOUNTER — Emergency Department (HOSPITAL_COMMUNITY)
Admission: EM | Admit: 2015-09-15 | Discharge: 2015-09-16 | Disposition: A | Discharge status: Home / Self Care | Payer: Medicare Other | Attending: Emergency Medicine | Admitting: Emergency Medicine

## 2015-09-15 ENCOUNTER — Encounter (HOSPITAL_COMMUNITY): Payer: Self-pay | Admitting: Emergency Medicine

## 2015-09-15 ENCOUNTER — Emergency Department (HOSPITAL_COMMUNITY): Payer: Medicare Other

## 2015-09-15 DIAGNOSIS — R55 Syncope and collapse: Secondary | ICD-10-CM | POA: Diagnosis not present

## 2015-09-15 DIAGNOSIS — E1165 Type 2 diabetes mellitus with hyperglycemia: Secondary | ICD-10-CM | POA: Insufficient documentation

## 2015-09-15 DIAGNOSIS — I951 Orthostatic hypotension: Secondary | ICD-10-CM | POA: Diagnosis not present

## 2015-09-15 DIAGNOSIS — R4781 Slurred speech: Secondary | ICD-10-CM | POA: Diagnosis not present

## 2015-09-15 DIAGNOSIS — R739 Hyperglycemia, unspecified: Secondary | ICD-10-CM

## 2015-09-15 DIAGNOSIS — Z7982 Long term (current) use of aspirin: Secondary | ICD-10-CM | POA: Diagnosis not present

## 2015-09-15 DIAGNOSIS — Z79899 Other long term (current) drug therapy: Secondary | ICD-10-CM | POA: Insufficient documentation

## 2015-09-15 DIAGNOSIS — Z794 Long term (current) use of insulin: Secondary | ICD-10-CM | POA: Diagnosis not present

## 2015-09-15 LAB — BASIC METABOLIC PANEL
Anion gap: 9 (ref 5–15)
BUN: 13 mg/dL (ref 6–20)
CO2: 25 mmol/L (ref 22–32)
Calcium: 9.5 mg/dL (ref 8.9–10.3)
Chloride: 95 mmol/L — ABNORMAL LOW (ref 101–111)
Creatinine, Ser: 1.12 mg/dL (ref 0.61–1.24)
GFR calc Af Amer: >60 mL/min (ref >60)
GFR calc non Af Amer: >60 mL/min (ref >60)
Glucose, Bld: 566 mg/dL (ref 65–99)
Potassium: 4.4 mmol/L (ref 3.5–5.1)
Sodium: 129 mmol/L — ABNORMAL LOW (ref 135–145)

## 2015-09-15 LAB — URINALYSIS, ROUTINE W REFLEX MICROSCOPIC
Bilirubin Urine: NEGATIVE
Glucose, UA: >1000 mg/dL — AB
Hgb urine dipstick: NEGATIVE
Ketones, ur: NEGATIVE mg/dL
Leukocytes, UA: NEGATIVE
Nitrite: NEGATIVE
Protein, ur: NEGATIVE mg/dL
Specific Gravity, Urine: 1.035 — ABNORMAL HIGH (ref 1.005–1.030)
pH: 5 (ref 5.0–8.0)

## 2015-09-15 LAB — CBG MONITORING, ED: Glucose-Capillary: 525 mg/dL — ABNORMAL HIGH (ref 65–99)

## 2015-09-15 LAB — CBC
HCT: 39 % (ref 39.0–52.0)
Hemoglobin: 12.8 g/dL — ABNORMAL LOW (ref 13.0–17.0)
MCH: 26.2 pg (ref 26.0–34.0)
MCHC: 32.8 g/dL (ref 30.0–36.0)
MCV: 79.8 fL (ref 78.0–100.0)
Platelets: 210 10*3/uL (ref 150–400)
RBC: 4.89 MIL/uL (ref 4.22–5.81)
RDW: 13.3 % (ref 11.5–15.5)
WBC: 6.2 10*3/uL (ref 4.0–10.5)

## 2015-09-15 LAB — URINE MICROSCOPIC-ADD ON
RBC / HPF: NONE SEEN RBC/hpf (ref 0–5)
WBC, UA: NONE SEEN WBC/hpf (ref 0–5)

## 2015-09-15 MED ORDER — INSULIN ASPART 100 UNIT/ML ~~LOC~~ SOLN
10.0000 [IU] | Freq: Once | SUBCUTANEOUS | Status: AC
  Administered 2015-09-15: 10 [IU] via INTRAVENOUS
  Filled 2015-09-15: qty 1

## 2015-09-15 MED ORDER — SODIUM CHLORIDE 0.9 % IV BOLUS (SEPSIS)
1000.0000 mL | Freq: Once | INTRAVENOUS | Status: AC
  Administered 2015-09-15: 1000 mL via INTRAVENOUS

## 2015-09-15 NOTE — ED Notes (Signed)
Dr. Eulis Foster notified on pt.'s elevated blood sugar.

## 2015-09-15 NOTE — ED Notes (Signed)
Pt. reports brief syncopal episode this evening , denies injury , alert and oriented / respirations unlabored . Pt. also stated he lost his medications and hyperglycemia .

## 2015-09-15 NOTE — ED Notes (Signed)
CHECKED CBG 525, RN BOBBY INFORMED

## 2015-09-15 NOTE — ED Provider Notes (Signed)
CSN: [id number]     Arrival date & time 09/15/15  2114 History   First MD Initiated Contact with Patient 09/15/15 2158     Chief Complaint  Patient presents with  . Loss of Consciousness     (Consider location/radiation/quality/duration/timing/severity/associated sxs/prior Treatment) HPI Comments: Patient presents to the emergency department with chief complaint of syncopal episode. Patient states that he got out of his vehicle today, stood up quickly and felt lightheaded. He had to sit back down in his car and rests. States that he went to stand up again, and then passed out. He states that he has felt fatigued as of late, and attributes this to his blood sugar running high. He denies any recent illnesses. States that he has difficulty controlling his blood sugar. States that he recently lost his medications. Additionally, patient states that he has had some slurred speech as well as tingling in his left hand. He states that he has had these symptoms in the past, and they went away on their own. There are no modifying factors. He has not taken anything for his symptoms.  The history is provided by the patient. No language interpreter was used.    Past Medical History  Diagnosis Date  . HIV (human immunodeficiency virus infection) ([place]) 2009    CD4 count 100, VL 13800 (05/01/2010)  . Diabetes type 2, uncontrolled ([place])     HgA1c 17.6 (04/27/2010)  . Hypertension   . Genital warts   . Erectile dysfunction   . Chronic knee pain     right  . Osteomyelitis ([place])     h/o hand  . Diabetes mellitus   . Chronic pain   . AIDS [facility])    Past Surgical History  Procedure Laterality Date  . Multiple extractions with alveoloplasty N/A 01/18/2013    Procedure: MULTIPLE EXTRACION 3, 6, 7, 10, 11, 13, 21, 22, 27, 28, 29, 30 WITH ALVEOLOPLASTY;  Surgeon: Gae Bon, DDS;  Location: [place];  Service: Oral Surgery;  Laterality: N/A;  . Hernia repair    . Minor irrigation and debridement of wound  Right 04/22/2014    Procedure: IRRIGATION AND DEBRIDEMENT OF RIGHT NECK ABCESS;  Surgeon: Jerrell Belfast, MD;  Location: [facility] OR;  Service: ENT;  Laterality: Right;  . I&d extremity Left 08/21/2014    Procedure: INCISION AND DRAINAGE LEFT SMALL FINGER;  Surgeon: Leanora Cover, MD;  Location: [place];  Service: Orthopedics;  Laterality: Left;   Family History  Problem Relation Age of Onset  . Hypertension Mother   . Arthritis Father   . Hypertension Father   . Hypertension Brother   . Cancer Maternal Grandmother 6    unknown type of cancer  . Depression Paternal Grandmother    Social History  Substance Use Topics  . Smoking status: Never Smoker   . Smokeless tobacco: Never Used  . Alcohol Use: No    Review of Systems  Constitutional: Negative for fever and chills.  Respiratory: Negative for shortness of breath.   Cardiovascular: Negative for chest pain.  Gastrointestinal: Negative for nausea, vomiting, diarrhea and constipation.  Genitourinary: Negative for dysuria.  Neurological: Positive for syncope and speech difficulty.  All other systems reviewed and are negative.     Allergies  Sulfa antibiotics  Home Medications   Prior to Admission medications   Medication Sig Start Date End Date Taking? Authorizing Provider  abacavir-dolutegravir-lamiVUDine (TRIUMEQ) 600-50-300 MG tablet Take 1 tablet by mouth daily. 06/01/15  Yes [name], MD  aspirin 81 MG EC tablet Take 81 mg by mouth daily.  08/20/11  Yes [name], MD  dapsone 100 MG tablet TAKE 1 TABLET BY MOUTH EVERY DAY. NEED APPOINTMENT 09/19/14  Yes [name], MD  darunavir-cobicistat (PREZCOBIX) 800-150 MG tablet Take 1 tablet by mouth daily. Take with food. 06/01/15  Yes [name], MD  imiquimod [place]) 5 % cream Apply topically 3 (three) times a week. 01/30/15  Yes [name], MD  Insulin Glargine (LANTUS SOLOSTAR) 100 UNIT/ML Solostar Pen Inject 60 Units into the skin daily at 10 pm.  06/01/14  Yes [name], MD  insulin lispro (HUMALOG) 100 UNIT/ML KiwkPen Inject 0.08 mLs (8 Units total) into the skin 3 (three) times daily. Patient taking differently: Inject 7 Units into the skin 3 (three) times daily.  02/21/14  Yes [name], MD  LORazepam (ATIVAN) 1 MG tablet Take 1 tablet (1 mg total) by mouth every 6 (six) hours as needed for anxiety. 01/30/15  Yes [name], MD  oxyCODONE-acetaminophen (PERCOCET) 10-325 MG tablet Take 1 tablet by mouth every 6 (six) hours as needed for pain.   Yes Historical Provider, MD  oxyCODONE-acetaminophen (PERCOCET) 5-325 MG per tablet 1-2 tabs po q6 hours prn pain 08/21/14  Yes Leanora Cover, MD  pravastatin (PRAVACHOL) 40 MG tablet Take 1 tablet (40 mg total) by mouth daily. 01/03/14  Yes [name], MD  HYDROcodone-acetaminophen (NORCO) 5-325 MG tablet Take 1-2 tablets by mouth every 6 (six) hours as needed. Patient not taking: Reported on 09/15/2015 03/09/15   Thurnell Lose, MD  traMADol (ULTRAM) 50 MG tablet Take 1 tablet (50 mg total) by mouth every 6 (six) hours as needed. Patient not taking: Reported on 03/09/2015 03/03/15   [name], PA-C   BP 101/80 mmHg  Pulse 102  Temp(Src) 97.6 F (36.4 C) (Oral)  Resp 16  SpO2 98% Physical Exam  Constitutional: He is oriented to person, place, and time. He appears well-developed and well-nourished.  HENT:  Head: Normocephalic and atraumatic.  Dry mucus membranes  Eyes: Conjunctivae and EOM are normal. Pupils are equal, round, and reactive to light. Right eye exhibits no discharge. Left eye exhibits no discharge. No scleral icterus.  Neck: Normal range of motion. Neck supple. No JVD present.  Cardiovascular: Normal rate, regular rhythm and normal heart sounds.  Exam reveals no gallop and no friction rub.   No murmur heard. Pulmonary/Chest: Effort normal and breath sounds normal. No respiratory distress. He has no wheezes. He has no rales. He exhibits no tenderness.   Abdominal: Soft. He exhibits no distension and no mass. There is no tenderness. There is no rebound and no guarding.  Musculoskeletal: Normal range of motion. He exhibits no edema or tenderness.  Moves all extremities  Neurological: He is alert and oriented to person, place, and time.  Slurred speech Otherwise, CN III-12 grossly intact, moves extremities, normal sensation and strength throughout, movements are goal oriented  Skin: Skin is warm and dry.  Psychiatric: He has a normal mood and affect. His behavior is normal. Judgment and thought content normal.  Nursing note and vitals reviewed.   ED Course  Procedures (including critical care time) Results for orders placed or performed during the hospital encounter of [id number]  Basic metabolic panel  Result Value Ref Range   Sodium 129 (L) 135 - 145 mmol/L   Potassium 4.4 3.5 - 5.1 mmol/L   Chloride 95 (L) 101 - 111 mmol/L  CO2 25 22 - 32 mmol/L   Glucose, Bld 566 (HH) 65 - 99 mg/dL   BUN 13 6 - 20 mg/dL   Creatinine, Ser 1.12 0.61 - 1.24 mg/dL   Calcium 9.5 8.9 - 10.3 mg/dL   GFR calc non Af Amer >60 >60 mL/min   GFR calc Af Amer >60 >60 mL/min   Anion gap 9 5 - 15  CBC  Result Value Ref Range   WBC 6.2 4.0 - 10.5 K/uL   RBC 4.89 4.22 - 5.81 MIL/uL   Hemoglobin 12.8 (L) 13.0 - 17.0 g/dL   HCT 39.0 39.0 - 52.0 %   MCV 79.8 78.0 - 100.0 fL   MCH 26.2 26.0 - 34.0 pg   MCHC 32.8 30.0 - 36.0 g/dL   RDW 13.3 11.5 - 15.5 %   Platelets 210 150 - 400 K/uL  Urinalysis, Routine w reflex microscopic  Result Value Ref Range   Color, Urine YELLOW YELLOW   APPearance CLEAR CLEAR   Specific Gravity, Urine 1.035 (H) 1.005 - 1.030   pH 5.0 5.0 - 8.0   Glucose, UA >1000 (A) NEGATIVE mg/dL   Hgb urine dipstick NEGATIVE NEGATIVE   Bilirubin Urine NEGATIVE NEGATIVE   Ketones, ur NEGATIVE NEGATIVE mg/dL   Protein, ur NEGATIVE NEGATIVE mg/dL   Nitrite NEGATIVE NEGATIVE   Leukocytes, UA NEGATIVE NEGATIVE  Urine microscopic-add on   Result Value Ref Range   Squamous Epithelial / LPF 0-5 (A) NONE SEEN   WBC, UA NONE SEEN 0 - 5 WBC/hpf   RBC / HPF NONE SEEN 0 - 5 RBC/hpf   Bacteria, UA RARE (A) NONE SEEN  CBG monitoring, ED  Result Value Ref Range   Glucose-Capillary 525 (H) 65 - 99 mg/dL   Comment 1 Notify RN   CBG monitoring, ED  Result Value Ref Range   Glucose-Capillary 178 (H) 65 - 99 mg/dL   Ct Head Wo Contrast  09/16/2015  CLINICAL DATA:  50 year old male with syncope and slurred speech EXAM: CT HEAD WITHOUT CONTRAST TECHNIQUE: Contiguous axial images were obtained from the base of the skull through the vertex without intravenous contrast. COMPARISON:  Head CT dated 06/12/2012 FINDINGS: The ventricles and sulci are appropriate in size for patient's age. There is no acute intracranial hemorrhage. No mass effect or midline shift noted. The visualized paranasal sinuses and mastoid air cells are clear. The calvarium is intact. IMPRESSION: No acute intracranial pathology. Electronically Signed   By: Anner Crete M.D.   On: 09/16/2015 00:19    I have personally reviewed and evaluated these images and lab results as part of my medical decision-making. ED ECG REPORT  I personally interpreted this EKG   Date: 09/16/2015   Rate: 96  Rhythm: normal sinus rhythm  QRS Axis: normal  Intervals: normal  ST/T Wave abnormalities: normal  Conduction Disutrbances:none  Narrative Interpretation:   Old EKG Reviewed: none available    MDM   Final diagnoses:  Syncope, unspecified syncope type  Hyperglycemia  Orthostatic hypotension    Patient with syncopal episode. The event occurred after he stood up out of his car. States he became lightheaded and passed out. He states that he has felt dehydrated, and also has been having high blood sugar. I suspect that the syncope was secondary to orthostasis. Will give fluids, treat hyperglycemia, and reassess. Additionally the patient states that he has had some slurred speech  and left hand tingling. He has had the symptoms in the past before when his sugar  is high, and they have gone away on their own, however I feel it warranted to check CT head tonight.  10:58 PM Positive orthostatic VS.  Will give some additional fluids.  12:25 AM BP has come up with fluids, glucose is normalizing.  CT head is negative.  Patient has close follow-up, and states the he can most likely be seen on Monday or Tuesday.  I have encouraged him to do this and to return for new or worsening symptoms.   Montine Circle, PA-C 09/16/15 0030  Daleen Bo, MD 09/16/15 [phone]

## 2015-09-16 DIAGNOSIS — I951 Orthostatic hypotension: Secondary | ICD-10-CM | POA: Diagnosis not present

## 2015-09-16 LAB — CBG MONITORING, ED: Glucose-Capillary: 178 mg/dL — ABNORMAL HIGH (ref 65–99)

## 2015-09-16 NOTE — Discharge Instructions (Signed)
Hyperglycemia °Hyperglycemia occurs when the glucose (sugar) in your blood is too high. Hyperglycemia can happen for many reasons, but it most often happens to people who do not know they have diabetes or are not managing their diabetes properly.  °CAUSES  °Whether you have diabetes or not, there are other causes of hyperglycemia. Hyperglycemia can occur when you have diabetes, but it can also occur in other situations that you might not be as aware of, such as: °Diabetes °· If you have diabetes and are having problems controlling your blood glucose, hyperglycemia could occur because of some of the following reasons: °¨ Not following your meal plan. °¨ Not taking your diabetes medications or not taking it properly. °¨ Exercising less or doing less activity than you normally do. °¨ Being sick. °Pre-diabetes °· This cannot be ignored. Before people develop Type 2 diabetes, they almost always have "pre-diabetes." This is when your blood glucose levels are higher than normal, but not yet high enough to be diagnosed as diabetes. Research has shown that some long-term damage to the body, especially the heart and circulatory system, may already be occurring during pre-diabetes. If you take action to manage your blood glucose when you have pre-diabetes, you may delay or prevent Type 2 diabetes from developing. °Stress °· If you have diabetes, you may be "diet" controlled or on oral medications or insulin to control your diabetes. However, you may find that your blood glucose is higher than usual in the hospital whether you have diabetes or not. This is often referred to as "stress hyperglycemia." Stress can elevate your blood glucose. This happens because of hormones put out by the body during times of stress. If stress has been the cause of your high blood glucose, it can be followed regularly by your caregiver. That way he/she can make sure your hyperglycemia does not continue to get worse or progress to  diabetes. °Steroids °· Steroids are medications that act on the infection fighting system (immune system) to block inflammation or infection. One side effect can be a rise in blood glucose. Most people can produce enough extra insulin to allow for this rise, but for those who cannot, steroids make blood glucose levels go even higher. It is not unusual for steroid treatments to "uncover" diabetes that is developing. It is not always possible to determine if the hyperglycemia will go away after the steroids are stopped. A special blood test called an A1c is sometimes done to determine if your blood glucose was elevated before the steroids were started. °SYMPTOMS °· Thirsty. °· Frequent urination. °· Dry mouth. °· Blurred vision. °· Tired or fatigue. °· Weakness. °· Sleepy. °· Tingling in feet or leg. °DIAGNOSIS  °Diagnosis is made by monitoring blood glucose in one or all of the following ways: °· A1c test. This is a chemical found in your blood. °· Fingerstick blood glucose monitoring. °· Laboratory results. °TREATMENT  °First, knowing the cause of the hyperglycemia is important before the hyperglycemia can be treated. Treatment may include, but is not be limited to: °· Education. °· Change or adjustment in medications. °· Change or adjustment in meal plan. °· Treatment for an illness, infection, etc. °· More frequent blood glucose monitoring. °· Change in exercise plan. °· Decreasing or stopping steroids. °· Lifestyle changes. °HOME CARE INSTRUCTIONS  °· Test your blood glucose as directed. °· Exercise regularly. Your caregiver will give you instructions about exercise. Pre-diabetes or diabetes which comes on with stress is helped by exercising. °· Eat wholesome,   balanced meals. Eat often and at regular, fixed times. Your caregiver or nutritionist will give you a meal plan to guide your sugar intake.  Being at an ideal weight is important. If needed, losing as little as 10 to 15 pounds may help improve blood  glucose levels. SEEK MEDICAL CARE IF:   You have questions about medicine, activity, or diet.  You continue to have symptoms (problems such as increased thirst, urination, or weight gain). SEEK IMMEDIATE MEDICAL CARE IF:   You are vomiting or have diarrhea.  Your breath smells fruity.  You are breathing faster or slower.  You are very sleepy or incoherent.  You have numbness, tingling, or pain in your feet or hands.  You have chest pain.  Your symptoms get worse even though you have been following your caregiver's orders.  If you have any other questions or concerns.   This information is not intended to replace advice given to you by your health care provider. Make sure you discuss any questions you have with your health care provider.   Document Released: 09/18/2000 Document Revised: 06/17/2011 Document Reviewed: 11/29/2014 Elsevier Interactive Patient Education 2016 [employer] Orthostatic Hypotension Orthostatic hypotension is a sudden drop in blood pressure. It happens when you quickly stand up from a seated or lying position. You may feel dizzy or light-headed. This can last for just a few seconds or for up to a few minutes. It is usually not a serious problem. However, if this happens frequently or gets worse, it can be a sign of something more serious. CAUSES  Different things can cause orthostatic hypotension, including:   Loss of body fluids (dehydration).  Medicines that lower blood pressure.  Sudden changes in posture, such as standing up quickly after you have been sitting or lying down.  Taking too much of your medicine. SIGNS AND SYMPTOMS   Light-headedness or dizziness.   Fainting or near-fainting.   A fast heart rate.   Weakness.   Feeling tired (fatigue).  DIAGNOSIS  Your health care provider may do several things to help diagnose your condition and identify the cause. These may include:   Taking a medical history and doing a physical  exam.  Checking your blood pressure. Your health care provider will check your blood pressure when you are:  Lying down.  Sitting.  Standing.  Using tilt table testing. In this test, you lie down on a table that moves from a lying position to a standing position. You will be strapped onto the table. This test monitors your blood pressure and heart rate when you are in different positions. TREATMENT  Treatment will vary depending on the cause. Possible treatments include:   Changing the dosage of your medicines.  Wearing compression stockings on your lower legs.  Standing up slowly after sitting or lying down.  Eating more salt.  Eating frequent, small meals.  In some cases, getting IV fluids.  Taking medicine to enhance fluid retention. HOME CARE INSTRUCTIONS  Only take over-the-counter or prescription medicines as directed by your health care provider.  Follow your health care provider's instructions for changing the dosage of your current medicines.  Do not stop or adjust your medicine on your own.  Stand up slowly after sitting or lying down. This allows your body to adjust to the different position.  Wear compression stockings as directed.  Eat extra salt as directed.  Do not add extra salt to your diet unless directed to by your health care provider.  Eat  frequent, small meals.  Avoid standing suddenly after eating.  Avoid hot showers or excessive heat as directed by your health care provider.  Keep all follow-up appointments. SEEK MEDICAL CARE IF:  You continue to feel dizzy or light-headed after standing.  You feel groggy or confused.  You feel cold, clammy, or sick to your stomach (nauseous).  You have blurred vision.  You feel short of breath. SEEK IMMEDIATE MEDICAL CARE IF:   You faint after standing.  You have chest pain.  You have difficulty breathing.   You lose feeling or movement in your arms or legs.   You have slurred speech  or difficulty talking, or you are unable to talk.  MAKE SURE YOU:   Understand these instructions.  Will watch your condition.  Will get help right away if you are not doing well or get worse.   This information is not intended to replace advice given to you by your health care provider. Make sure you discuss any questions you have with your health care provider.   Document Released: 03/15/2002 Document Revised: 03/30/2013 Document Reviewed: 01/15/2013 Elsevier Interactive Patient Education [employer].

## 2015-09-16 NOTE — ED Notes (Signed)
CBG 178, nurse notified.

## 2015-09-16 NOTE — ED Notes (Signed)
Pt requested to speak to MD, when RN returned he had left.  RN had reviewed discharge instructions and he aknowledged understanding however left before he signed.

## 2015-09-22 ENCOUNTER — Ambulatory Visit (INDEPENDENT_AMBULATORY_CARE_PROVIDER_SITE_OTHER): Payer: Medicare Other | Admitting: Podiatry

## 2015-09-22 ENCOUNTER — Encounter: Payer: Self-pay | Admitting: Podiatry

## 2015-09-22 VITALS — BP 155/86 | HR 89 | Resp 16 | Ht 69.0 in | Wt 260.0 lb

## 2015-09-22 DIAGNOSIS — E1149 Type 2 diabetes mellitus with other diabetic neurological complication: Secondary | ICD-10-CM | POA: Diagnosis not present

## 2015-09-22 DIAGNOSIS — B351 Tinea unguium: Secondary | ICD-10-CM | POA: Diagnosis not present

## 2015-09-22 DIAGNOSIS — M79674 Pain in right toe(s): Secondary | ICD-10-CM | POA: Diagnosis not present

## 2015-09-22 DIAGNOSIS — L84 Corns and callosities: Secondary | ICD-10-CM

## 2015-09-22 DIAGNOSIS — M79675 Pain in left toe(s): Secondary | ICD-10-CM | POA: Diagnosis not present

## 2015-09-22 NOTE — Patient Instructions (Signed)
Diabetes and Foot Care Diabetes may cause you to have problems because of poor blood supply (circulation) to your feet and legs. This may cause the skin on your feet to become thinner, break easier, and heal more slowly. Your skin may become dry, and the skin may peel and crack. You may also have nerve damage in your legs and feet causing decreased feeling in them. You may not notice minor injuries to your feet that could lead to infections or more serious problems. Taking care of your feet is one of the most important things you can do for yourself.  HOME CARE INSTRUCTIONS  Wear shoes at all times, even in the house. Do not go barefoot. Bare feet are easily injured.  Check your feet daily for blisters, cuts, and redness. If you cannot see the bottom of your feet, use a mirror or ask someone for help.  Wash your feet with warm water (do not use hot water) and mild soap. Then pat your feet and the areas between your toes until they are completely dry. Do not soak your feet as this can dry your skin.  Apply a moisturizing lotion or petroleum jelly (that does not contain alcohol and is unscented) to the skin on your feet and to dry, brittle toenails. Do not apply lotion between your toes.  Trim your toenails straight across. Do not dig under them or around the cuticle. File the edges of your nails with an emery board or nail file.  Do not cut corns or calluses or try to remove them with medicine.  Wear clean socks or stockings every day. Make sure they are not too tight. Do not wear knee-high stockings since they may decrease blood flow to your legs.  Wear shoes that fit properly and have enough cushioning. To break in new shoes, wear them for just a few hours a day. This prevents you from injuring your feet. Always look in your shoes before you put them on to be sure there are no objects inside.  Do not cross your legs. This may decrease the blood flow to your feet.  If you find a minor scrape,  cut, or break in the skin on your feet, keep it and the skin around it clean and dry. These areas may be cleansed with mild soap and water. Do not cleanse the area with peroxide, alcohol, or iodine.  When you remove an adhesive bandage, be sure not to damage the skin around it.  If you have a wound, look at it several times a day to make sure it is healing.  Do not use heating pads or hot water bottles. They may burn your skin. If you have lost feeling in your feet or legs, you may not know it is happening until it is too late.  Make sure your health care provider performs a complete foot exam at least annually or more often if you have foot problems. Report any cuts, sores, or bruises to your health care provider immediately. SEEK MEDICAL CARE IF:   You have an injury that is not healing.  You have cuts or breaks in the skin.  You have an ingrown nail.  You notice redness on your legs or feet.  You feel burning or tingling in your legs or feet.  You have pain or cramps in your legs and feet.  Your legs or feet are numb.  Your feet always feel cold. SEEK IMMEDIATE MEDICAL CARE IF:   There is increasing redness,   swelling, or pain in or around a wound.  There is a red line that goes up your leg.  Pus is coming from a wound.  You develop a fever or as directed by your health care provider.  You notice a bad smell coming from an ulcer or wound.   This information is not intended to replace advice given to you by your health care provider. Make sure you discuss any questions you have with your health care provider.   Document Released: 03/22/2000 Document Revised: 11/25/2012 Document Reviewed: 09/01/2012 Elsevier Interactive Patient Education 2016 Elsevier Inc.  

## 2015-09-22 NOTE — Progress Notes (Signed)
   Subjective:    Patient ID: DEMON HOTOP, male    DOB: [birth year], 50 y.o.   MRN: [id number]  HPI Chief Complaint  Patient presents with  . Debridement    Bilateral nail trim; pt diabetic type 2; sugar=220 this am; A1C=15.3 (03/09/15)  . Callouses    Bilateral; plantar    Patient presents the office today for concerns of thick, painful, elongated toenails that he cannot trim himself as well as for callouses on both of his feet. Denies any redness or drainage or any swelling to the areas. He does have numbness to his feet. Denies any burning pain. No other complaints. Last A1c 15.3.   Review of Systems  All other systems reviewed and are negative.      Objective:   Physical Exam General: AAO x3, NAD  Dermatological: Nails are hypertrophic, dystrophic, brittle, discolored, elongated 10. No swelling redness or drainage. Tenderness to nails 1-5 bilaterally. Hyperkeratotic lesions left plantar hallux pre-ulcerative. There is no open sores identified this and there is no drainage or pus. No swelling or redness. No other open lesions or pre-ulcerative lesions.  Vascular: Dorsalis Pedis artery and Posterior Tibial artery pedal pulses are 2/4 bilateral with immedate capillary fill time. Pedal hair growth present.  There is no pain with calf compression, swelling, warmth, erythema.   Neruologic: Sensation decreased with Derrel Nip monofilament, vibratory sensation intact.  Musculoskeletal: No gross boney pedal deformities bilateral. No pain, crepitus, or limitation noted with foot and ankle range of motion bilateral. Muscular strength 5/5 in all groups tested bilateral.  Gait: Unassisted, Nonantalgic.       Assessment & Plan:  50 year old male with symptomatic onychomycosis, pre-ulcerative lesion -Treatment options discussed including all alternatives, risks, and complications -Etiology of symptoms were discussed -Nails debrided 10 without complications or  bleeding. -Pre-ulcerative lesion debrided 1 without couple complications or bleeding. -Daily foot inspection -Follow-up in 3 months or sooner if any problems arise. In the meantime, encouraged to call the office with any questions, concerns, change in symptoms.   Celesta Gentile, DPM

## 2015-09-28 ENCOUNTER — Encounter: Payer: Self-pay | Admitting: Podiatry

## 2015-10-04 DIAGNOSIS — M545 Low back pain: Secondary | ICD-10-CM | POA: Diagnosis not present

## 2015-10-04 DIAGNOSIS — G44209 Tension-type headache, unspecified, not intractable: Secondary | ICD-10-CM | POA: Diagnosis not present

## 2015-10-04 DIAGNOSIS — E109 Type 1 diabetes mellitus without complications: Secondary | ICD-10-CM | POA: Diagnosis not present

## 2015-10-04 DIAGNOSIS — B20 Human immunodeficiency virus [HIV] disease: Secondary | ICD-10-CM | POA: Diagnosis not present

## 2015-11-07 IMAGING — CR DG KNEE COMPLETE 4+V*R*
4 series · 4 of 4 positions shown · non-contrast
Comparison: Right knee radiographs performed 01/23/2014

CLINICAL DATA: Acute onset of right knee pain.  Initial encounter.

EXAM:
RIGHT KNEE - COMPLETE 4+ VIEW

[knee ap]
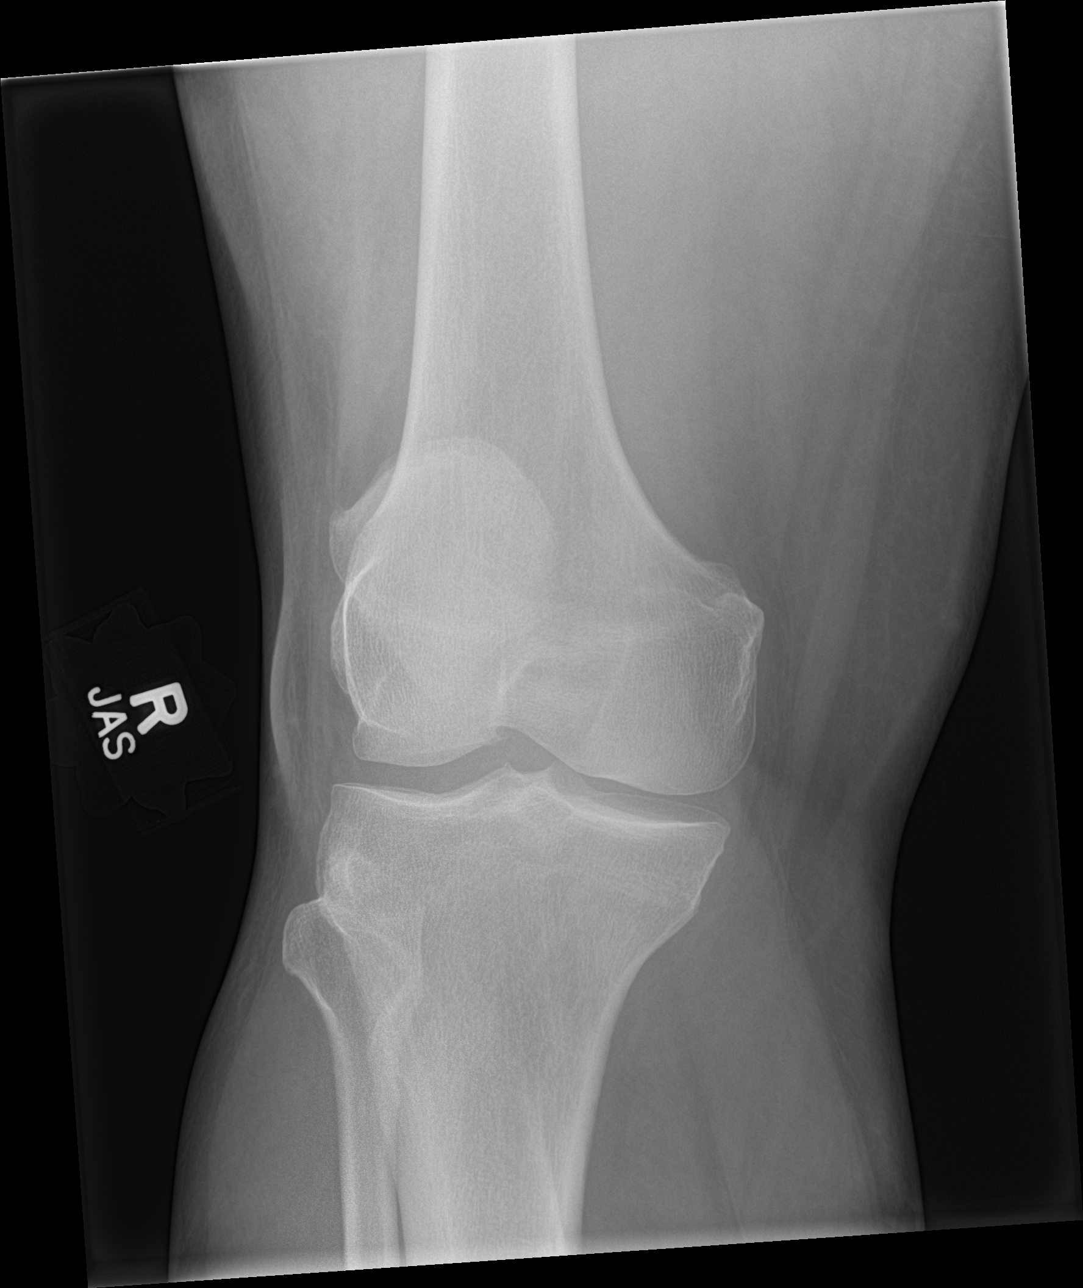

[tunnel]
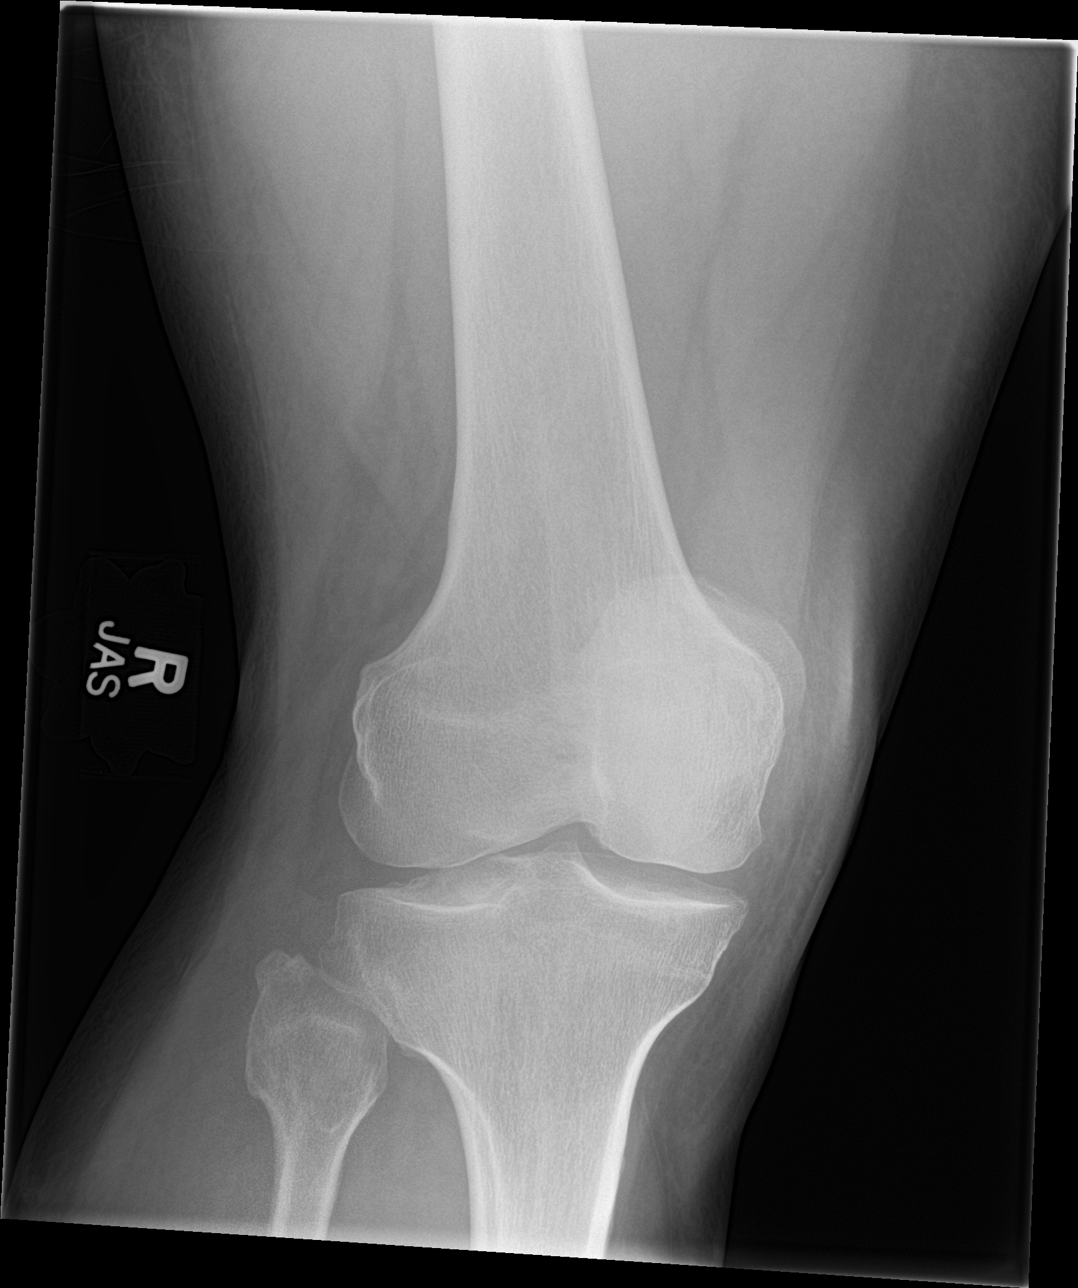

[knee lat]
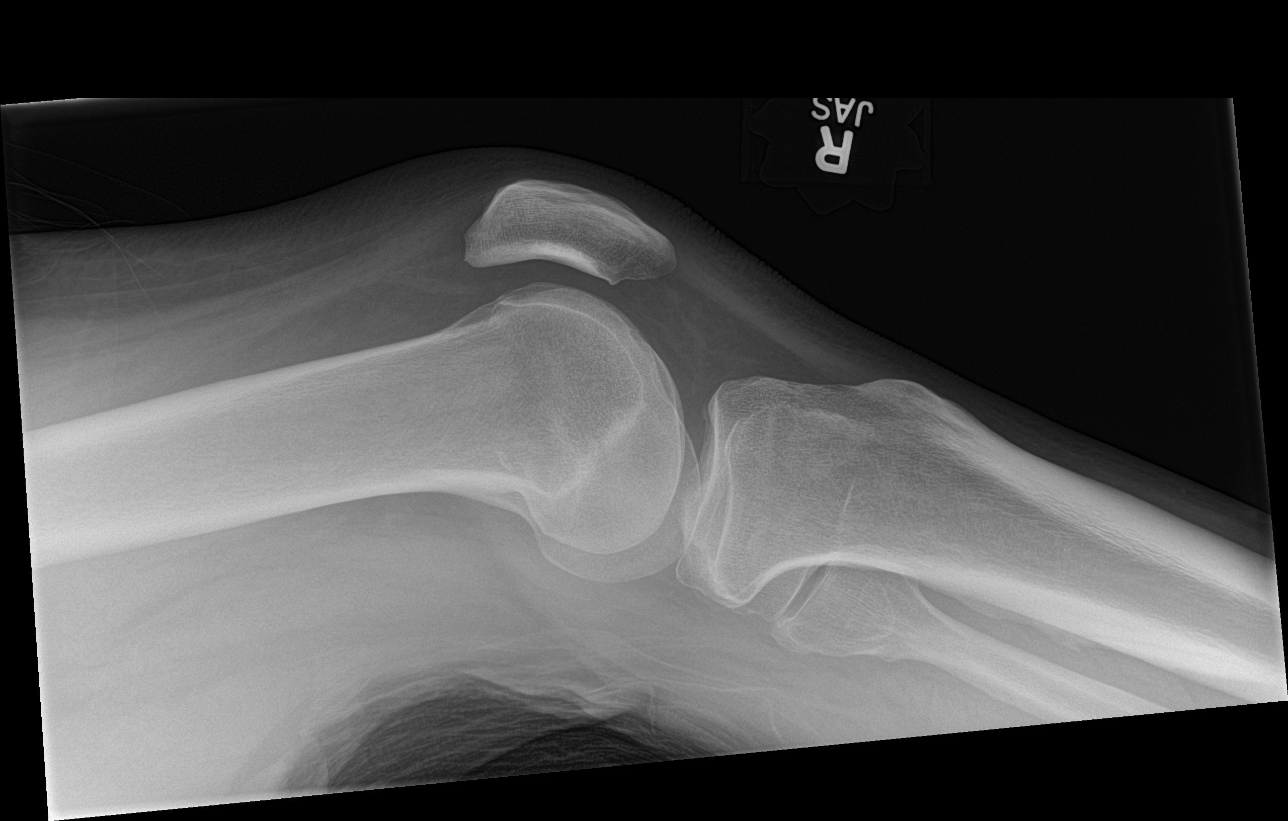

[knee obl]
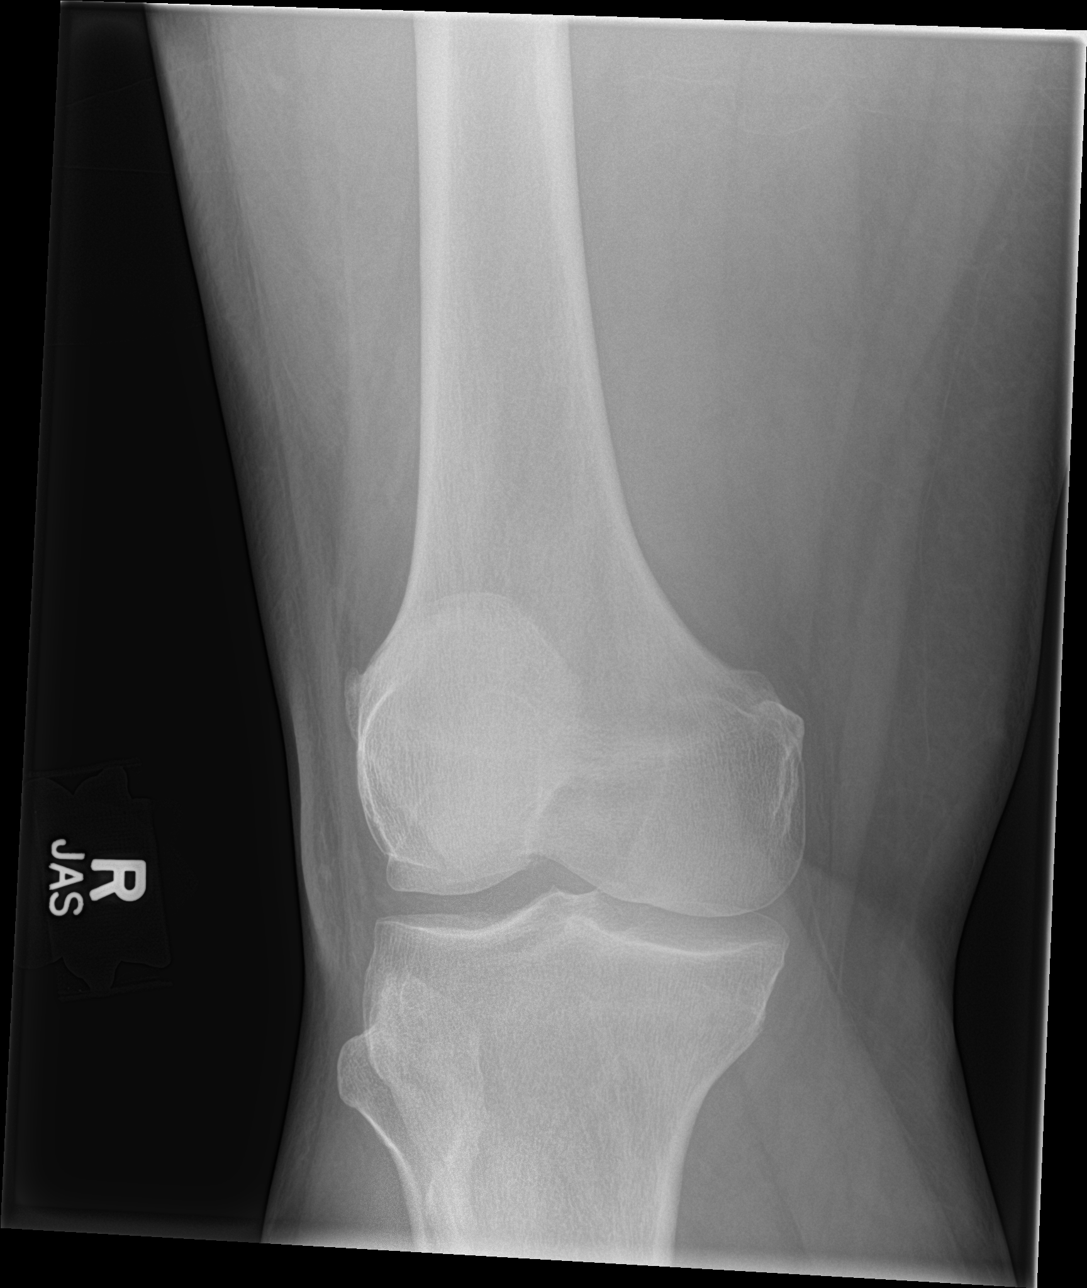

[4 of 4 positions shown; findings below may reference images not displayed]

FINDINGS: There is no evidence of fracture or dislocation. The joint spaces
are preserved. No significant degenerative change is seen; the
patellofemoral joint is grossly unremarkable in appearance.

No significant joint effusion is seen. The visualized soft tissues
are normal in appearance.
IMPRESSION: No evidence of fracture or dislocation.

## 2015-11-10 DIAGNOSIS — E109 Type 1 diabetes mellitus without complications: Secondary | ICD-10-CM | POA: Diagnosis not present

## 2015-11-10 DIAGNOSIS — B20 Human immunodeficiency virus [HIV] disease: Secondary | ICD-10-CM | POA: Diagnosis not present

## 2015-11-10 DIAGNOSIS — G44209 Tension-type headache, unspecified, not intractable: Secondary | ICD-10-CM | POA: Diagnosis not present

## 2015-11-10 DIAGNOSIS — M545 Low back pain: Secondary | ICD-10-CM | POA: Diagnosis not present

## 2015-11-15 ENCOUNTER — Other Ambulatory Visit: Payer: Medicare Other

## 2015-11-21 DIAGNOSIS — I10 Essential (primary) hypertension: Secondary | ICD-10-CM | POA: Insufficient documentation

## 2015-11-21 DIAGNOSIS — E1065 Type 1 diabetes mellitus with hyperglycemia: Secondary | ICD-10-CM | POA: Diagnosis not present

## 2015-11-21 DIAGNOSIS — E1165 Type 2 diabetes mellitus with hyperglycemia: Secondary | ICD-10-CM | POA: Diagnosis not present

## 2015-11-21 DIAGNOSIS — Z7982 Long term (current) use of aspirin: Secondary | ICD-10-CM | POA: Insufficient documentation

## 2015-11-22 ENCOUNTER — Encounter (HOSPITAL_COMMUNITY): Payer: Self-pay | Admitting: Emergency Medicine

## 2015-11-22 ENCOUNTER — Emergency Department (HOSPITAL_COMMUNITY)
Admission: EM | Admit: 2015-11-22 | Discharge: 2015-11-22 | Disposition: A | Discharge status: Home / Self Care | Payer: Medicare Other | Attending: Emergency Medicine | Admitting: Emergency Medicine

## 2015-11-22 DIAGNOSIS — E1065 Type 1 diabetes mellitus with hyperglycemia: Secondary | ICD-10-CM | POA: Diagnosis not present

## 2015-11-22 DIAGNOSIS — R739 Hyperglycemia, unspecified: Secondary | ICD-10-CM

## 2015-11-22 LAB — URINALYSIS, ROUTINE W REFLEX MICROSCOPIC
Bilirubin Urine: NEGATIVE
Glucose, UA: >1000 mg/dL — AB
Ketones, ur: NEGATIVE mg/dL
Leukocytes, UA: NEGATIVE
Nitrite: NEGATIVE
Protein, ur: NEGATIVE mg/dL
Specific Gravity, Urine: 1.015 (ref 1.005–1.030)
pH: 6 (ref 5.0–8.0)

## 2015-11-22 LAB — COMPREHENSIVE METABOLIC PANEL
ALT: 17 U/L (ref 17–63)
AST: 19 U/L (ref 15–41)
Albumin: 3.8 g/dL (ref 3.5–5.0)
Alkaline Phosphatase: 88 U/L (ref 38–126)
Anion gap: 9 (ref 5–15)
BUN: 12 mg/dL (ref 6–20)
CO2: 25 mmol/L (ref 22–32)
Calcium: 9.6 mg/dL (ref 8.9–10.3)
Chloride: 91 mmol/L — ABNORMAL LOW (ref 101–111)
Creatinine, Ser: 1.25 mg/dL — ABNORMAL HIGH (ref 0.61–1.24)
GFR calc Af Amer: >60 mL/min (ref >60)
GFR calc non Af Amer: >60 mL/min (ref >60)
Glucose, Bld: 686 mg/dL (ref 65–99)
Potassium: 4.1 mmol/L (ref 3.5–5.1)
Sodium: 125 mmol/L — ABNORMAL LOW (ref 135–145)
Total Bilirubin: 0.6 mg/dL (ref 0.3–1.2)
Total Protein: 7.8 g/dL (ref 6.5–8.1)

## 2015-11-22 LAB — CBG MONITORING, ED
Glucose-Capillary: 258 mg/dL — ABNORMAL HIGH (ref 65–99)
Glucose-Capillary: 370 mg/dL — ABNORMAL HIGH (ref 65–99)
Glucose-Capillary: 411 mg/dL — ABNORMAL HIGH (ref 65–99)
Glucose-Capillary: 492 mg/dL — ABNORMAL HIGH (ref 65–99)
Glucose-Capillary: 563 mg/dL (ref 65–99)
Glucose-Capillary: >600 mg/dL (ref 65–99)

## 2015-11-22 LAB — URINE MICROSCOPIC-ADD ON: Squamous Epithelial / LPF: NONE SEEN

## 2015-11-22 LAB — CBC
HCT: 38.7 % — ABNORMAL LOW (ref 39.0–52.0)
Hemoglobin: 12.7 g/dL — ABNORMAL LOW (ref 13.0–17.0)
MCH: 26.7 pg (ref 26.0–34.0)
MCHC: 32.8 g/dL (ref 30.0–36.0)
MCV: 81.3 fL (ref 78.0–100.0)
Platelets: 225 10*3/uL (ref 150–400)
RBC: 4.76 MIL/uL (ref 4.22–5.81)
RDW: 12.7 % (ref 11.5–15.5)
WBC: 6.4 10*3/uL (ref 4.0–10.5)

## 2015-11-22 MED ORDER — SODIUM CHLORIDE 0.9 % IV BOLUS (SEPSIS)
1000.0000 mL | Freq: Once | INTRAVENOUS | Status: AC
  Administered 2015-11-22: 1000 mL via INTRAVENOUS

## 2015-11-22 MED ORDER — INSULIN ASPART 100 UNIT/ML ~~LOC~~ SOLN
10.0000 [IU] | Freq: Once | SUBCUTANEOUS | Status: AC
  Administered 2015-11-22: 10 [IU] via INTRAVENOUS
  Filled 2015-11-22: qty 1

## 2015-11-22 MED ORDER — INSULIN ASPART 100 UNIT/ML ~~LOC~~ SOLN
5.0000 [IU] | Freq: Once | SUBCUTANEOUS | Status: AC
  Administered 2015-11-22: 5 [IU] via INTRAVENOUS

## 2015-11-22 MED ORDER — SODIUM CHLORIDE 0.9 % IV SOLN
INTRAVENOUS | Status: DC
  Administered 2015-11-22: 07:00:00 via INTRAVENOUS

## 2015-11-22 MED ORDER — SODIUM CHLORIDE 0.9 % IV SOLN
INTRAVENOUS | Status: DC
  Administered 2015-11-22: 5 [IU]/h via INTRAVENOUS
  Filled 2015-11-22: qty 2.5

## 2015-11-22 NOTE — ED Triage Notes (Signed)
Pt presents to ED after his glucometer at home read "high"  CBG checked hear, also reading "high".  Pt sts his glucometer has been reading high x 4 days.  Sts he has also had diarrhea.   Denies CP, SOB, Abd Pain, or other symptoms.

## 2015-11-22 NOTE — ED Notes (Signed)
Pt's insulin gtt decreased to 3.5 units/hour at this time per Glucostabilizer.

## 2015-11-22 NOTE — ED Notes (Signed)
Called for room X1

## 2015-11-22 NOTE — ED Notes (Signed)
Per Dr. Leonides Schanz we can d/c insulin drip IV. Pt CBG is 258.

## 2015-11-22 NOTE — ED Provider Notes (Signed)
TIME SEEN: 4:30 AM  CHIEF COMPLAINT: Hyperglycemia  HPI: Pt is a 50 y.o. M with IDDM, HIV who presents to the emergency department with complaints of hyperglycemia. Reports she's been feeling well. Denies any pain, fevers, cough, vomiting, diarrhea. Denies history of DKA. States that he has been compliant with his insulin but states that he has been eating "a lot of cake" recently.  ROS: See HPI Constitutional: no fever  Eyes: no drainage  ENT: no runny nose   Cardiovascular:  no chest pain  Resp: no SOB  GI: no vomiting GU: no dysuria Integumentary: no rash  Allergy: no hives  Musculoskeletal: no leg swelling  Neurological: no slurred speech ROS otherwise negative  PAST MEDICAL HISTORY/PAST SURGICAL HISTORY:  Past Medical History:  Diagnosis Date  . AIDS ([place])   . Chronic knee pain    right  . Chronic pain   . Diabetes mellitus   . Diabetes type 2, uncontrolled ([place])    HgA1c 17.6 (04/27/2010)  . Erectile dysfunction   . Genital warts   . HIV (human immunodeficiency virus infection) ([place]) 2009   CD4 count 100, VL 13800 (05/01/2010)  . Hypertension   . Osteomyelitis ([place])    h/o hand    MEDICATIONS:  Prior to Admission medications   Medication Sig Start Date End Date Taking? Authorizing Provider  abacavir-dolutegravir-lamiVUDine (TRIUMEQ) 600-50-300 MG tablet Take 1 tablet by mouth daily. 06/01/15  Yes [name], MD  aspirin 81 MG EC tablet Take 81 mg by mouth daily.  08/20/11  Yes [name], MD  dapsone 100 MG tablet TAKE 1 TABLET BY MOUTH EVERY DAY. NEED APPOINTMENT 09/19/14  Yes [name], MD  darunavir-cobicistat (PREZCOBIX) 800-150 MG tablet Take 1 tablet by mouth daily. Take with food. 06/01/15  Yes [name], MD  imiquimod [place]) 5 % cream Apply topically 3 (three) times a week. 01/30/15  Yes [name], MD  Insulin Glargine (LANTUS SOLOSTAR) 100 UNIT/ML Solostar Pen Inject 60 Units into the skin daily at 10 pm. 06/01/14  Yes  [name], MD  insulin lispro (HUMALOG) 100 UNIT/ML KiwkPen Inject 0.08 mLs (8 Units total) into the skin 3 (three) times daily. Patient taking differently: Inject 7 Units into the skin 3 (three) times daily.  02/21/14  Yes [name], MD  LORazepam (ATIVAN) 1 MG tablet Take 1 tablet (1 mg total) by mouth every 6 (six) hours as needed for anxiety. 01/30/15  Yes [name], MD  oxyCODONE-acetaminophen (PERCOCET) 10-325 MG tablet Take 1 tablet by mouth every 6 (six) hours as needed for pain.   Yes Historical Provider, MD  pravastatin (PRAVACHOL) 40 MG tablet Take 1 tablet (40 mg total) by mouth daily. 01/03/14  Yes [name], MD  traMADol (ULTRAM) 50 MG tablet Take 1 tablet (50 mg total) by mouth every 6 (six) hours as needed. 03/03/15  Yes [name], PA-C    ALLERGIES:  Allergies  Allergen Reactions  . Sulfa Antibiotics Itching    SOCIAL HISTORY:  Social History  Substance Use Topics  . Smoking status: Never Smoker  . Smokeless tobacco: Never Used  . Alcohol use No    FAMILY HISTORY: Family History  Problem Relation Age of Onset  . Hypertension Mother   . Arthritis Father   . Hypertension Father   . Hypertension Brother   . Cancer Maternal Grandmother 52    unknown type of cancer  . Depression Paternal Grandmother     EXAM:  BP 113/74   Pulse 80   Temp 99.2 F (37.3 C) (Oral)   Resp 15   Ht 5\' 9"  (1.753 m)   SpO2 99%  CONSTITUTIONAL: Alert and oriented and responds appropriately to questions. Well-nourished, Chronically ill-appearing but in no distress, afebrile and nontoxic HEAD: Normocephalic EYES: Conjunctivae clear, PERRL ENT: normal nose; no rhinorrhea; moist mucous membranes NECK: Supple, no meningismus, no LAD  CARD: RRR; S1 and S2 appreciated; no murmurs, no clicks, no rubs, no gallops RESP: Normal chest excursion without splinting or tachypnea; breath sounds clear and equal bilaterally; no wheezes, no rhonchi, no rales, no hypoxia or  respiratory distress, speaking full sentences ABD/GI: Normal bowel sounds; non-distended; soft, non-tender, no rebound, no guarding, no peritoneal signs BACK:  The back appears normal and is non-tender to palpation, there is no CVA tenderness EXT: Normal ROM in all joints; non-tender to palpation; no edema; normal capillary refill; no cyanosis, no calf tenderness or swelling    SKIN: Normal color for age and race; warm; no rash NEURO: Moves all extremities equally, sensation to light touch intact diffusely, cranial nerves II through XII intact PSYCH: The patient's mood and manner are appropriate. Grooming and personal hygiene are appropriate.  MEDICAL DECISION MAKING: Patient here with hyperglycemia.  Patient does not appear to be in DKA. Normal bicarbonate, anion gap and no ketones in his urine. He has no current complaints. I suspect this is because he has not been compliant with a low carbohydrate diet. He is well known to our emergency department for hyperglycemia. We'll give IV fluids, IV insulin bolus and start insulin drip. Patient would like to go home if possible and I think this is a reasonable plan.  ED PROGRESS: Blood glucose now in the 250s after fluids and insulin. I feel he is safe to be discharged home with outpatient follow-up.   At this time, I do not feel there is any life-threatening condition present. I have reviewed and discussed all results (EKG, imaging, lab, urine as appropriate), exam findings with patient/family. I have reviewed nursing notes and appropriate previous records.  I feel the patient is safe to be discharged home without further emergent workup and can continue workup as an outpatient. Discussed usual and customary return precautions. Patient/family verbalize understanding and are comfortable with this plan.  Outpatient follow-up has been provided. All questions have been answered.      [place], DO 11/22/15 [phone]

## 2015-11-22 NOTE — ED Notes (Addendum)
Pt arrives to B18 at this time. Pt reports that he has had elevated blood glucose for the "last week".   Chief Complaint  Patient presents with  . Hyperglycemia   Past Medical History:  Diagnosis Date  . AIDS ([place])   . Chronic knee pain    right  . Chronic pain   . Diabetes mellitus   . Diabetes type 2, uncontrolled ([place])    HgA1c 17.6 (04/27/2010)  . Erectile dysfunction   . Genital warts   . HIV (human immunodeficiency virus infection) ([place]) 2009   CD4 count 100, VL 13800 (05/01/2010)  . Hypertension   . Osteomyelitis ([place])    h/o hand

## 2015-11-22 NOTE — ED Notes (Signed)
Glucose level called from lab.  Charge nurse Gretta Cool, RN made aware

## 2015-11-28 ENCOUNTER — Emergency Department (HOSPITAL_COMMUNITY)
Admission: EM | Admit: 2015-11-28 | Discharge: 2015-11-28 | Disposition: A | Discharge status: Home / Self Care | Payer: Medicare Other | Attending: Emergency Medicine | Admitting: Emergency Medicine

## 2015-11-28 ENCOUNTER — Encounter (HOSPITAL_COMMUNITY): Payer: Self-pay

## 2015-11-28 DIAGNOSIS — Z79899 Other long term (current) drug therapy: Secondary | ICD-10-CM | POA: Insufficient documentation

## 2015-11-28 DIAGNOSIS — Z9114 Patient's other noncompliance with medication regimen: Secondary | ICD-10-CM

## 2015-11-28 DIAGNOSIS — I10 Essential (primary) hypertension: Secondary | ICD-10-CM | POA: Insufficient documentation

## 2015-11-28 DIAGNOSIS — Z794 Long term (current) use of insulin: Secondary | ICD-10-CM | POA: Diagnosis not present

## 2015-11-28 DIAGNOSIS — Z9119 Patient's noncompliance with other medical treatment and regimen: Secondary | ICD-10-CM | POA: Diagnosis not present

## 2015-11-28 DIAGNOSIS — Z7982 Long term (current) use of aspirin: Secondary | ICD-10-CM | POA: Insufficient documentation

## 2015-11-28 DIAGNOSIS — E1165 Type 2 diabetes mellitus with hyperglycemia: Secondary | ICD-10-CM | POA: Diagnosis not present

## 2015-11-28 DIAGNOSIS — R739 Hyperglycemia, unspecified: Secondary | ICD-10-CM

## 2015-11-28 LAB — URINE MICROSCOPIC-ADD ON
RBC / HPF: NONE SEEN RBC/hpf (ref 0–5)
WBC, UA: NONE SEEN WBC/hpf (ref 0–5)

## 2015-11-28 LAB — BASIC METABOLIC PANEL
Anion gap: 9 (ref 5–15)
BUN: 8 mg/dL (ref 6–20)
CO2: 27 mmol/L (ref 22–32)
Calcium: 9.6 mg/dL (ref 8.9–10.3)
Chloride: 90 mmol/L — ABNORMAL LOW (ref 101–111)
Creatinine, Ser: 1.21 mg/dL (ref 0.61–1.24)
GFR calc Af Amer: >60 mL/min (ref >60)
GFR calc non Af Amer: >60 mL/min (ref >60)
Glucose, Bld: 749 mg/dL (ref 65–99)
Potassium: 3.8 mmol/L (ref 3.5–5.1)
Sodium: 126 mmol/L — ABNORMAL LOW (ref 135–145)

## 2015-11-28 LAB — CBG MONITORING, ED
Glucose-Capillary: 249 mg/dL — ABNORMAL HIGH (ref 65–99)
Glucose-Capillary: 315 mg/dL — ABNORMAL HIGH (ref 65–99)
Glucose-Capillary: 390 mg/dL — ABNORMAL HIGH (ref 65–99)
Glucose-Capillary: 420 mg/dL — ABNORMAL HIGH (ref 65–99)
Glucose-Capillary: 558 mg/dL (ref 65–99)
Glucose-Capillary: >600 mg/dL (ref 65–99)

## 2015-11-28 LAB — URINALYSIS, ROUTINE W REFLEX MICROSCOPIC
Bilirubin Urine: NEGATIVE
Glucose, UA: >1000 mg/dL — AB
Hgb urine dipstick: NEGATIVE
Ketones, ur: NEGATIVE mg/dL
Leukocytes, UA: NEGATIVE
Nitrite: NEGATIVE
Protein, ur: NEGATIVE mg/dL
Specific Gravity, Urine: 1.034 — ABNORMAL HIGH (ref 1.005–1.030)
pH: 5.5 (ref 5.0–8.0)

## 2015-11-28 LAB — CBC
HCT: 38.5 % — ABNORMAL LOW (ref 39.0–52.0)
Hemoglobin: 12.4 g/dL — ABNORMAL LOW (ref 13.0–17.0)
MCH: 26.7 pg (ref 26.0–34.0)
MCHC: 32.2 g/dL (ref 30.0–36.0)
MCV: 82.8 fL (ref 78.0–100.0)
Platelets: 230 10*3/uL (ref 150–400)
RBC: 4.65 MIL/uL (ref 4.22–5.81)
RDW: 13 % (ref 11.5–15.5)
WBC: 6.9 10*3/uL (ref 4.0–10.5)

## 2015-11-28 MED ORDER — SODIUM CHLORIDE 0.9 % IV BOLUS (SEPSIS)
1000.0000 mL | Freq: Once | INTRAVENOUS | Status: AC
  Administered 2015-11-28: 1000 mL via INTRAVENOUS

## 2015-11-28 MED ORDER — DEXTROSE-NACL 5-0.45 % IV SOLN: INTRAVENOUS | Status: DC

## 2015-11-28 MED ORDER — SODIUM CHLORIDE 0.9 % IV SOLN
INTRAVENOUS | Status: DC
  Administered 2015-11-28: 5 [IU]/h via INTRAVENOUS
  Filled 2015-11-28: qty 2.5

## 2015-11-28 MED ORDER — SODIUM CHLORIDE 0.9 % IV BOLUS (SEPSIS)
2000.0000 mL | Freq: Once | INTRAVENOUS | Status: AC
  Administered 2015-11-28: 2000 mL via INTRAVENOUS

## 2015-11-28 NOTE — ED Provider Notes (Signed)
[facility] DEPT Provider Note   CSN: [id number] Arrival date & time: 11/28/15  0004     History   Chief Complaint Chief Complaint  Patient presents with  . Hyperglycemia    HPI Keith [name] is a 50 y.o. male.  Keith [name] is a 51 y.o. male  with a hx of insulin at that diabetes presents to the Emergency Department complaining of gradual, persistent, progressively worsening high blood sugars and associated fatigue. Patient reports this has been going on for several days. He reports he is out of his insulin. He reports that he can refill this tomorrow. He endorses polyuria, polydipsia, polyphagia.  He reports 2 episodes of nonbloody, nonbilious emesis.   Patient reports he needs some fluids and insulin. He reports he has not been compliant with his diet.  Patient is adamant from the onset that he will not be admitted.  He reports that he has things to do tomorrow and does not want to stay overnight, but he is willing to stay in the emergency department for treatment.   The history is provided by the patient and medical records. No language interpreter was used.       Past Medical History:  Diagnosis Date  . AIDS ([place])   . Chronic knee pain    right  . Chronic pain   . Diabetes mellitus   . Diabetes type 2, uncontrolled ([place])    HgA1c 17.6 (04/27/2010)  . Erectile dysfunction   . Genital warts   . HIV (human immunodeficiency virus infection) ([place]) 2009   CD4 count 100, VL 13800 (05/01/2010)  . Hypertension   . Osteomyelitis [facility])    h/o hand    Patient Active Problem List   Diagnosis Date Noted  . Onychomycosis of multiple toenails with type 2 diabetes mellitus ([place]) 08/29/2015  . Abscess of finger of right hand 03/08/2015  . Hyperglycemia 05/30/2014  . Neck abscess   . Cellulitis, neck   . MRSA carrier 04/20/2014  . Testosterone deficiency 04/20/2014  . Furuncle of neck 04/19/2014  . Genital warts 02/22/2014  . HIV disease ([place])   . Hyperglycemia due  to type 2 diabetes mellitus ([place]) 02/04/2014  . Dental anomaly 11/20/2012  . Arthritis of right knee 02/23/2012  . Hyperlipidemia 11/10/2011  . Chronic pain 08/07/2011  . Meralgia paraesthetica 04/23/2011  . Type 2 diabetes with circulatory disorder causing erectile dysfunction ([place]) 06/25/2010  . ERECTILE DYSFUNCTION 08/22/2008  . Essential hypertension 05/19/2008    Past Surgical History:  Procedure Laterality Date  . HERNIA REPAIR    . I&D EXTREMITY Left 08/21/2014   Procedure: INCISION AND DRAINAGE LEFT SMALL FINGER;  Surgeon: Leanora Cover, MD;  Location: [place];  Service: Orthopedics;  Laterality: Left;  . MINOR IRRIGATION AND DEBRIDEMENT OF WOUND Right 04/22/2014   Procedure: IRRIGATION AND DEBRIDEMENT OF RIGHT NECK ABCESS;  Surgeon: Jerrell Belfast, MD;  Location: [place];  Service: ENT;  Laterality: Right;  . MULTIPLE EXTRACTIONS WITH ALVEOLOPLASTY N/A 01/18/2013   Procedure: MULTIPLE EXTRACION 3, 6, 7, 10, 11, 13, 21, 22, 27, 28, 29, 30 WITH ALVEOLOPLASTY;  Surgeon: Gae Bon, DDS;  Location: [facility];  Service: Oral Surgery;  Laterality: N/A;       Home Medications    Prior to Admission medications   Medication Sig Start Date End Date Taking? Authorizing Provider  abacavir-dolutegravir-lamiVUDine (TRIUMEQ) 600-50-300 MG tablet Take 1 tablet by mouth daily. 06/01/15  Yes [name], MD  aspirin 81 MG EC tablet  Take 81 mg by mouth daily.  08/20/11  Yes [name], MD  dapsone 100 MG tablet TAKE 1 TABLET BY MOUTH EVERY DAY. NEED APPOINTMENT 09/19/14  Yes [name], MD  darunavir-cobicistat (PREZCOBIX) 800-150 MG tablet Take 1 tablet by mouth daily. Take with food. 06/01/15  Yes [name], MD  imiquimod Leroy Sea) 5 % cream Apply topically 3 (three) times a week. 01/30/15  Yes [name], MD  Insulin Glargine (LANTUS SOLOSTAR) 100 UNIT/ML Solostar Pen Inject 60 Units into the skin daily at 10 pm. 06/01/14  Yes [name], MD  insulin lispro  (HUMALOG) 100 UNIT/ML KiwkPen Inject 0.08 mLs (8 Units total) into the skin 3 (three) times daily. Patient taking differently: Inject 7 Units into the skin 3 (three) times daily.  02/21/14  Yes [name], MD  LORazepam (ATIVAN) 1 MG tablet Take 1 tablet (1 mg total) by mouth every 6 (six) hours as needed for anxiety. 01/30/15  Yes [name], MD  oxyCODONE-acetaminophen (PERCOCET) 10-325 MG tablet Take 1 tablet by mouth every 6 (six) hours as needed for pain.   Yes Historical Provider, MD  pravastatin (PRAVACHOL) 40 MG tablet Take 1 tablet (40 mg total) by mouth daily. 01/03/14  Yes [name], MD  traMADol (ULTRAM) 50 MG tablet Take 1 tablet (50 mg total) by mouth every 6 (six) hours as needed. Patient taking differently: Take 50 mg by mouth every 6 (six) hours as needed for moderate pain.  03/03/15  Yes [name], PA-C    Family History Family History  Problem Relation Age of Onset  . Hypertension Mother   . Arthritis Father   . Hypertension Father   . Hypertension Brother   . Cancer Maternal Grandmother 29    unknown type of cancer  . Depression Paternal Grandmother     Social History Social History  Substance Use Topics  . Smoking status: Never Smoker  . Smokeless tobacco: Never Used  . Alcohol use No     Allergies   Sulfa antibiotics   Review of Systems Review of Systems  Constitutional: Positive for fatigue.  Endocrine: Positive for polydipsia, polyphagia and polyuria.  All other systems reviewed and are negative.    Physical Exam Updated Vital Signs BP (!) 144/102   Pulse 68   Temp 97.9 F (36.6 C) (Oral)   Resp 16   SpO2 96%   Physical Exam  Constitutional: He appears well-developed and well-nourished. No distress.  Awake, alert, nontoxic appearance  HENT:  Head: Normocephalic and atraumatic.  Mouth/Throat: Oropharynx is clear and moist. Mucous membranes are dry. No oropharyngeal exudate.  Eyes: Conjunctivae are normal. No scleral  icterus.  Neck: Normal range of motion. Neck supple.  Cardiovascular: Normal rate, regular rhythm and intact distal pulses.   Pulmonary/Chest: Effort normal and breath sounds normal. No respiratory distress. He has no wheezes.  Equal chest expansion  Abdominal: Soft. Bowel sounds are normal. He exhibits no mass. There is no tenderness. There is no rebound and no guarding.  Musculoskeletal: Normal range of motion. He exhibits no edema.  Neurological: He is alert.  Speech is clear and goal oriented Moves extremities without ataxia  Skin: Skin is warm and dry. He is not diaphoretic.  Psychiatric: He has a normal mood and affect.  Nursing note and vitals reviewed.    ED Treatments / Results  Labs (all labs ordered are listed, but only abnormal results are displayed) Labs Reviewed  BASIC METABOLIC PANEL -  Abnormal; Notable for the following:       Result Value   Sodium 126 (*)    Chloride 90 (*)    Glucose, Bld 749 (*)    All other components within normal limits  CBC - Abnormal; Notable for the following:    Hemoglobin 12.4 (*)    HCT 38.5 (*)    All other components within normal limits  URINALYSIS, ROUTINE W REFLEX MICROSCOPIC (NOT AT [facility]) - Abnormal; Notable for the following:    Specific Gravity, Urine 1.034 (*)    Glucose, UA >1000 (*)    All other components within normal limits  URINE MICROSCOPIC-ADD ON - Abnormal; Notable for the following:    Squamous Epithelial / LPF 0-5 (*)    Bacteria, UA RARE (*)    All other components within normal limits  CBG MONITORING, ED - Abnormal; Notable for the following:    Glucose-Capillary >600 (*)    All other components within normal limits  CBG MONITORING, ED - Abnormal; Notable for the following:    Glucose-Capillary 558 (*)    All other components within normal limits  CBG MONITORING, ED - Abnormal; Notable for the following:    Glucose-Capillary 420 (*)    All other components within normal limits  CBG MONITORING, ED -  Abnormal; Notable for the following:    Glucose-Capillary 390 (*)    All other components within normal limits    EKG  EKG Interpretation None       Radiology No results found.  Procedures Procedures (including critical care time)  Medications Ordered in ED Medications  dextrose 5 %-0.45 % sodium chloride infusion ( Intravenous Hold 11/28/15 0400)  insulin regular (NOVOLIN R,HUMULIN R) 250 Units in sodium chloride 0.9 % 250 mL (1 Units/mL) infusion (6.6 Units/hr Intravenous Rate/Dose Change 11/28/15 0609)  sodium chloride 0.9 % bolus 2,000 mL (0 mLs Intravenous Stopped 11/28/15 0508)  sodium chloride 0.9 % bolus 1,000 mL (1,000 mLs Intravenous New Bag/Given 11/28/15 0617)     Initial Impression / Assessment and Plan / ED Course  I have reviewed the triage vital signs and the nursing notes.  Pertinent labs & imaging results that were available during my care of the patient were reviewed by me and considered in my medical decision making (see chart for details).  Clinical Course  Value Comment By Time  Glucose: (!!) 749 Severe hyperglycemia.  Hyponatremia noted. Right Corrected 136. Jarrett Soho Maeby Vankleeck, PA-C 08/22 0424  Leukocytes, UA: NEGATIVE No UTI. Jarrett Soho Akshith Moncus, PA-C 08/22 0426   At shift change care transferred to [facility], PA-C.  She will continue to monitor CBG and d/c when < 300.  Pt remains stable without new complaints at this time.  No evidence of DKA.   Jarrett Soho Luisantonio Adinolfi, PA-C 08/22 [phone]    Pt with hyperglycemia 2/2 medication noncompliance.  No evidence of DKA.  Pt refused admission. He is receiving fluids and insulin via the glucostabilizer.    Final Clinical Impressions(s) / ED Diagnoses   Final diagnoses:  Hyperglycemia  History of medication noncompliance    New Prescriptions New Prescriptions   No medications on file     Abigail Butts, PA-C 11/28/15 [id number]    Merryl Hacker, MD 11/28/15 [phone]

## 2015-11-28 NOTE — ED Provider Notes (Signed)
  Physical Exam  BP (!) 144/102   Pulse 68   Temp 97.9 F (36.6 C) (Oral)   Resp 16   SpO2 96%   Physical Exam  Constitutional: Pt is oriented to person, place, and time. Pt appears well-developed and well-nourished. No distress.  Head: Normocephalic and atraumatic.  Eyes: Conjunctivae  normal.  Neck: Normal range of motion.  Pulmonary/Chest: Effort normal  No respiratory distress.  Abdominal: Soft. There is no tenderness. There is no rebound and no guarding.  Musculoskeletal: Normal range of motion.  Neurological: Pt. is alert and oriented to person, place, and time. thought content appropriate.  Skin: Skin is warm and dry. Pt is not diaphoretic.  Psychiatric: Pt has a normal mood and affect. Behavior is normal. Judgment and thought content normal.  Nursing note and vitals reviewed.   ED Course  Procedures  MDM  Pt care signed out to me at change of shift from [facility], PA-C  Pt with hyperglycemia, no anion gap. Pt refuses to be admitted. Plan is to d/c pt when his blood sugar is below 300.   Pt blood glucose 249 at 0819. Discussed with pt that he needs to take his diabetes medication as prescribed by his PCP and he needs to f/u with his PCP or the [facility] and wellness center in 2 days to address his hyperglycemia and his hypertension. Discussed strict return precautions to the ED to include signs of hyperglycemia and DKA. Pt requesting narcotics for his chronic knee pain. I informed the pt that I would not give him any prescriptions for narcotics and offered motrin which he refused. Pt expressed understanding to the discharge instructions. Pt is stable and safe for discharge.    Kalman Drape, PA 11/28/15 [id number]    Merryl Hacker, MD 11/29/15 [phone]

## 2015-11-28 NOTE — ED Notes (Signed)
Pt pulled out his IV. This RN restarted IV on the left hand

## 2015-11-28 NOTE — ED Triage Notes (Signed)
Pt states sugar is high. Pt states normally takes insulin. States does not have a refill rx till Wednesday.

## 2015-11-28 NOTE — ED Notes (Signed)
Pt's CBG 249.  Informed Jenny Reichmann, RN.

## 2015-11-28 NOTE — Discharge Instructions (Signed)
You were seen today for elevated blood glucose. Be sure to take your diabetes medications as prescribed by your primary care provider. Follow up with your primary care provider or the [facility] and wellness center in 2 days.   Return to the emergency department if you experience signs of elevated blood sugar to include increase thirst, increased urination, abdominal pain, fatigue, vomiting or any other concerning symptoms.

## 2015-11-29 ENCOUNTER — Ambulatory Visit: Payer: Medicare Other | Admitting: Infectious Diseases

## 2015-12-06 DIAGNOSIS — Z79891 Long term (current) use of opiate analgesic: Secondary | ICD-10-CM | POA: Diagnosis not present

## 2015-12-06 DIAGNOSIS — G603 Idiopathic progressive neuropathy: Secondary | ICD-10-CM | POA: Diagnosis not present

## 2015-12-06 DIAGNOSIS — M5442 Lumbago with sciatica, left side: Secondary | ICD-10-CM | POA: Diagnosis not present

## 2015-12-06 DIAGNOSIS — G541 Lumbosacral plexus disorders: Secondary | ICD-10-CM | POA: Diagnosis not present

## 2015-12-06 DIAGNOSIS — M545 Low back pain: Secondary | ICD-10-CM | POA: Diagnosis not present

## 2015-12-06 DIAGNOSIS — G894 Chronic pain syndrome: Secondary | ICD-10-CM | POA: Diagnosis not present

## 2015-12-12 DIAGNOSIS — F4321 Adjustment disorder with depressed mood: Secondary | ICD-10-CM | POA: Diagnosis not present

## 2015-12-19 DIAGNOSIS — M545 Low back pain: Secondary | ICD-10-CM | POA: Diagnosis not present

## 2015-12-20 DIAGNOSIS — M5432 Sciatica, left side: Secondary | ICD-10-CM | POA: Diagnosis not present

## 2015-12-20 DIAGNOSIS — B20 Human immunodeficiency virus [HIV] disease: Secondary | ICD-10-CM | POA: Diagnosis not present

## 2015-12-20 DIAGNOSIS — Z79891 Long term (current) use of opiate analgesic: Secondary | ICD-10-CM | POA: Diagnosis not present

## 2015-12-20 DIAGNOSIS — G894 Chronic pain syndrome: Secondary | ICD-10-CM | POA: Diagnosis not present

## 2015-12-20 DIAGNOSIS — G44209 Tension-type headache, unspecified, not intractable: Secondary | ICD-10-CM | POA: Diagnosis not present

## 2015-12-20 DIAGNOSIS — E109 Type 1 diabetes mellitus without complications: Secondary | ICD-10-CM | POA: Diagnosis not present

## 2015-12-20 DIAGNOSIS — M545 Low back pain: Secondary | ICD-10-CM | POA: Diagnosis not present

## 2015-12-20 DIAGNOSIS — M5442 Lumbago with sciatica, left side: Secondary | ICD-10-CM | POA: Diagnosis not present

## 2015-12-22 ENCOUNTER — Ambulatory Visit: Payer: Medicare Other | Admitting: Podiatry

## 2015-12-24 ENCOUNTER — Encounter (HOSPITAL_COMMUNITY): Payer: Self-pay | Admitting: Emergency Medicine

## 2015-12-24 DIAGNOSIS — Z7982 Long term (current) use of aspirin: Secondary | ICD-10-CM | POA: Diagnosis not present

## 2015-12-24 DIAGNOSIS — Z79899 Other long term (current) drug therapy: Secondary | ICD-10-CM | POA: Diagnosis not present

## 2015-12-24 DIAGNOSIS — Z794 Long term (current) use of insulin: Secondary | ICD-10-CM | POA: Insufficient documentation

## 2015-12-24 DIAGNOSIS — E1165 Type 2 diabetes mellitus with hyperglycemia: Secondary | ICD-10-CM | POA: Diagnosis not present

## 2015-12-24 DIAGNOSIS — I10 Essential (primary) hypertension: Secondary | ICD-10-CM | POA: Insufficient documentation

## 2015-12-24 DIAGNOSIS — R739 Hyperglycemia, unspecified: Secondary | ICD-10-CM | POA: Diagnosis present

## 2015-12-24 LAB — BASIC METABOLIC PANEL
Anion gap: 8 (ref 5–15)
BUN: 7 mg/dL (ref 6–20)
CO2: 27 mmol/L (ref 22–32)
Calcium: 9.5 mg/dL (ref 8.9–10.3)
Chloride: 92 mmol/L — ABNORMAL LOW (ref 101–111)
Creatinine, Ser: 1.15 mg/dL (ref 0.61–1.24)
GFR calc Af Amer: >60 mL/min (ref >60)
GFR calc non Af Amer: >60 mL/min (ref >60)
Glucose, Bld: 601 mg/dL (ref 65–99)
Potassium: 3.8 mmol/L (ref 3.5–5.1)
Sodium: 127 mmol/L — ABNORMAL LOW (ref 135–145)

## 2015-12-24 LAB — URINALYSIS, ROUTINE W REFLEX MICROSCOPIC
Bilirubin Urine: NEGATIVE
Glucose, UA: >1000 mg/dL — AB
Hgb urine dipstick: NEGATIVE
Ketones, ur: NEGATIVE mg/dL
Leukocytes, UA: NEGATIVE
Nitrite: NEGATIVE
Protein, ur: NEGATIVE mg/dL
Specific Gravity, Urine: 1.029 (ref 1.005–1.030)
pH: 6 (ref 5.0–8.0)

## 2015-12-24 LAB — URINE MICROSCOPIC-ADD ON

## 2015-12-24 LAB — CBG MONITORING, ED: Glucose-Capillary: 569 mg/dL (ref 65–99)

## 2015-12-24 LAB — CBC
HCT: 39 % (ref 39.0–52.0)
Hemoglobin: 12.6 g/dL — ABNORMAL LOW (ref 13.0–17.0)
MCH: 26.6 pg (ref 26.0–34.0)
MCHC: 32.3 g/dL (ref 30.0–36.0)
MCV: 82.5 fL (ref 78.0–100.0)
Platelets: 248 10*3/uL (ref 150–400)
RBC: 4.73 MIL/uL (ref 4.22–5.81)
RDW: 12.7 % (ref 11.5–15.5)
WBC: 5.7 10*3/uL (ref 4.0–10.5)

## 2015-12-24 NOTE — ED Triage Notes (Signed)
C/o hyperglycemia.  Reports tingling in bilateral fingers and toes.

## 2015-12-25 ENCOUNTER — Emergency Department (HOSPITAL_COMMUNITY)
Admission: EM | Admit: 2015-12-25 | Discharge: 2015-12-25 | Disposition: A | Discharge status: Home / Self Care | Payer: Medicare Other | Attending: Emergency Medicine | Admitting: Emergency Medicine

## 2015-12-25 DIAGNOSIS — E1165 Type 2 diabetes mellitus with hyperglycemia: Secondary | ICD-10-CM | POA: Diagnosis not present

## 2015-12-25 DIAGNOSIS — R739 Hyperglycemia, unspecified: Secondary | ICD-10-CM

## 2015-12-25 LAB — CBG MONITORING, ED
Glucose-Capillary: 471 mg/dL — ABNORMAL HIGH (ref 65–99)
Glucose-Capillary: 220 mg/dL — ABNORMAL HIGH (ref 65–99)

## 2015-12-25 MED ORDER — SODIUM CHLORIDE 0.9 % IV BOLUS (SEPSIS)
1000.0000 mL | Freq: Once | INTRAVENOUS | Status: AC
  Administered 2015-12-25: 1000 mL via INTRAVENOUS

## 2015-12-25 MED ORDER — INSULIN ASPART 100 UNIT/ML IV SOLN
10.0000 [IU] | Freq: Once | INTRAVENOUS | Status: AC
  Administered 2015-12-25: 10 [IU] via INTRAVENOUS
  Filled 2015-12-25: qty 1

## 2015-12-25 NOTE — Discharge Instructions (Signed)
Please follow-up with your primary care physician Dr. [name] as scheduled. Please take your Lantus as prescribed. Please avoid foods high in sugar/carbohydrates (candy, pasta, sodas, juices, bread).

## 2015-12-25 NOTE — ED Provider Notes (Signed)
By signing my name below, I, Keith [name], attest that this documentation has been prepared under the direction and in the presence of [name], DO. Electronically Signed: Hansel [name], ED Scribe. 12/25/15. 1:20 AM.   TIME SEEN: 1:16 AM   CHIEF COMPLAINT:  Chief Complaint  Patient presents with  . Hyperglycemia     HPI:  HPI Comments: Keith [name] is a 50 y.o. male with h/o HIV, Hypertension, DM2 on 70 units qhs Lantus who presents to the Emergency Department complaining of hyperglycemia onset last night. He also complains of worsened tingling in his hands and feet from baseline at this time. Pt states he is taking his Lantus as prescribed and his last dose was 5 hours ago. PCP is Dr. [name]. Pt states he has an appointment with his PCP in 2 days to follow up on his ongoing difficulty controlling his CBGs.He reports medication compliance. He denies fever, cough, CP, vomiting, nausea, diarrhea.   ROS: See HPI Constitutional: no fever  Eyes: no drainage  ENT: no runny nose   Cardiovascular:  no chest pain  Resp: no SOB, cough  GI: no vomiting, nausea, diarrhea  GU: no dysuria Integumentary: no rash  Allergy: no hives  Musculoskeletal: no leg swelling  Neurological: no slurred speech. Positive tingling to hands and feet  ROS otherwise negative  PAST MEDICAL HISTORY/PAST SURGICAL HISTORY:  Past Medical History:  Diagnosis Date  . AIDS ([name])   . Chronic knee pain    right  . Chronic pain   . Diabetes mellitus   . Diabetes type 2, uncontrolled ([name])    HgA1c 17.6 (04/27/2010)  . Erectile dysfunction   . Genital warts   . HIV (human immunodeficiency virus infection) ([name]) 2009   CD4 count 100, VL 13800 (05/01/2010)  . Hypertension   . Osteomyelitis ([name])    h/o hand    MEDICATIONS:  Prior to Admission medications   Medication Sig Start Date End Date Taking? Authorizing Provider  abacavir-dolutegravir-lamiVUDine (TRIUMEQ) 600-50-300 MG tablet Take 1 tablet by mouth  daily. 06/01/15   [name], MD  aspirin 81 MG EC tablet Take 81 mg by mouth daily.  08/20/11   [name], MD  dapsone 100 MG tablet TAKE 1 TABLET BY MOUTH EVERY DAY. NEED APPOINTMENT 09/19/14   [name], MD  darunavir-cobicistat (PREZCOBIX) 800-150 MG tablet Take 1 tablet by mouth daily. Take with food. 06/01/15   [name], MD  imiquimod Leroy Sea) 5 % cream Apply topically 3 (three) times a week. 01/30/15   [name], MD  Insulin Glargine (LANTUS SOLOSTAR) 100 UNIT/ML Solostar Pen Inject 60 Units into the skin daily at 10 pm. 06/01/14   [name], MD  insulin lispro (HUMALOG) 100 UNIT/ML KiwkPen Inject 0.08 mLs (8 Units total) into the skin 3 (three) times daily. Patient taking differently: Inject 7 Units into the skin 3 (three) times daily.  02/21/14   [name], MD  LORazepam (ATIVAN) 1 MG tablet Take 1 tablet (1 mg total) by mouth every 6 (six) hours as needed for anxiety. 01/30/15   [name], MD  oxyCODONE-acetaminophen (PERCOCET) 10-325 MG tablet Take 1 tablet by mouth every 6 (six) hours as needed for pain.    Historical Provider, MD  pravastatin (PRAVACHOL) 40 MG tablet Take 1 tablet (40 mg total) by mouth daily. 01/03/14   [name], MD  traMADol (ULTRAM) 50 MG tablet Take 1 tablet (50 mg total) by mouth every  6 (six) hours as needed. Patient taking differently: Take 50 mg by mouth every 6 (six) hours as needed for moderate pain.  03/03/15   [name], PA-C    ALLERGIES:  Allergies  Allergen Reactions  . Sulfa Antibiotics Itching    SOCIAL HISTORY:  Social History  Substance Use Topics  . Smoking status: Never Smoker  . Smokeless tobacco: Never Used  . Alcohol use No    FAMILY HISTORY: Family History  Problem Relation Age of Onset  . Hypertension Mother   . Arthritis Father   . Hypertension Father   . Hypertension Brother   . Cancer Maternal Grandmother 12    unknown type of cancer  . Depression Paternal  Grandmother     EXAM: BP 138/92 (BP Location: Left Arm)   Pulse 76   Temp 98.2 F (36.8 C) (Oral)   Resp 16   Ht 5\' 10"  (1.778 m)   Wt 260 lb (117.9 kg)   SpO2 100%   BMI 37.31 kg/m  CONSTITUTIONAL: Alert and oriented and responds appropriately to questions. Well-appearing; well-nourished HEAD: Normocephalic EYES: Conjunctivae clear, PERRL ENT: normal nose; no rhinorrhea; slightly dry mucous membranes NECK: Supple, no meningismus, no LAD  CARD: RRR; S1 and S2 appreciated; no murmurs, no clicks, no rubs, no gallops RESP: Normal chest excursion without splinting or tachypnea; breath sounds clear and equal bilaterally; no wheezes, no rhonchi, no rales, no hypoxia or respiratory distress, speaking full sentences ABD/GI: Normal bowel sounds; non-distended; soft, non-tender, no rebound, no guarding, no peritoneal signs BACK:  The back appears normal and is non-tender to palpation, there is no CVA tenderness EXT: Normal ROM in all joints; non-tender to palpation; no edema; normal capillary refill; no cyanosis, no calf tenderness or swelling    SKIN: Normal color for age and race; warm; no rash NEURO: Moves all extremities equally, sensation to light touch intact diffusely, cranial nerves II through XII intact PSYCH: The patient's mood and manner are appropriate. Grooming and personal hygiene are appropriate.  MEDICAL DECISION MAKING: Patient here with hyperglycemia without signs of DKA. No infectious symptoms. He is well known to our emergency department for hyperglycemia. Suspect some medication noncompliance. He reports he has appointed to see his PCP in 2 days from now. Have strongly encouraged him to keep this appointment. We'll give IV fluids, insulin to get his blood sugar down. Anticipate discharge home.  ED PROGRESS: 2:40 AM  Pt's blood sugar is now 220. I feel he is safe to be discharged home. Has outpatient follow-up.   At this time, I do not feel there is any life-threatening  condition present. I have reviewed and discussed all results (EKG, imaging, lab, urine as appropriate), exam findings with patient/family. I have reviewed nursing notes and appropriate previous records.  I feel the patient is safe to be discharged home without further emergent workup and can continue workup as an outpatient as needed. Discussed usual and customary return precautions. Patient/family verbalize understanding and are comfortable with this plan.  Outpatient follow-up has been provided. All questions have been answered.     I personally performed the services described in this documentation, which was scribed in my presence. The recorded information has been reviewed and is accurate.    [place], DO 12/25/15 [phone]

## 2016-01-16 DIAGNOSIS — B20 Human immunodeficiency virus [HIV] disease: Secondary | ICD-10-CM | POA: Diagnosis not present

## 2016-01-16 DIAGNOSIS — E109 Type 1 diabetes mellitus without complications: Secondary | ICD-10-CM | POA: Diagnosis not present

## 2016-01-16 DIAGNOSIS — M545 Low back pain: Secondary | ICD-10-CM | POA: Diagnosis not present

## 2016-01-16 DIAGNOSIS — G44209 Tension-type headache, unspecified, not intractable: Secondary | ICD-10-CM | POA: Diagnosis not present

## 2016-01-17 ENCOUNTER — Encounter (HOSPITAL_COMMUNITY): Payer: Self-pay

## 2016-01-17 DIAGNOSIS — Z79899 Other long term (current) drug therapy: Secondary | ICD-10-CM | POA: Insufficient documentation

## 2016-01-17 DIAGNOSIS — I10 Essential (primary) hypertension: Secondary | ICD-10-CM | POA: Insufficient documentation

## 2016-01-17 DIAGNOSIS — E1165 Type 2 diabetes mellitus with hyperglycemia: Secondary | ICD-10-CM | POA: Diagnosis not present

## 2016-01-17 DIAGNOSIS — Z794 Long term (current) use of insulin: Secondary | ICD-10-CM | POA: Insufficient documentation

## 2016-01-17 DIAGNOSIS — Z7982 Long term (current) use of aspirin: Secondary | ICD-10-CM | POA: Insufficient documentation

## 2016-01-17 LAB — URINE MICROSCOPIC-ADD ON
RBC / HPF: NONE SEEN RBC/hpf (ref 0–5)
WBC, UA: NONE SEEN WBC/hpf (ref 0–5)

## 2016-01-17 LAB — CBC
HCT: 38.6 % — ABNORMAL LOW (ref 39.0–52.0)
Hemoglobin: 12.9 g/dL — ABNORMAL LOW (ref 13.0–17.0)
MCH: 26.8 pg (ref 26.0–34.0)
MCHC: 33.4 g/dL (ref 30.0–36.0)
MCV: 80.1 fL (ref 78.0–100.0)
Platelets: 242 10*3/uL (ref 150–400)
RBC: 4.82 MIL/uL (ref 4.22–5.81)
RDW: 13.3 % (ref 11.5–15.5)
WBC: 7.9 10*3/uL (ref 4.0–10.5)

## 2016-01-17 LAB — BASIC METABOLIC PANEL
Anion gap: 11 (ref 5–15)
BUN: 19 mg/dL (ref 6–20)
CO2: 27 mmol/L (ref 22–32)
Calcium: 9.4 mg/dL (ref 8.9–10.3)
Chloride: 92 mmol/L — ABNORMAL LOW (ref 101–111)
Creatinine, Ser: 1.53 mg/dL — ABNORMAL HIGH (ref 0.61–1.24)
GFR calc Af Amer: 59 mL/min — ABNORMAL LOW (ref >60)
GFR calc non Af Amer: 51 mL/min — ABNORMAL LOW (ref >60)
Glucose, Bld: 478 mg/dL — ABNORMAL HIGH (ref 65–99)
Potassium: 4 mmol/L (ref 3.5–5.1)
Sodium: 130 mmol/L — ABNORMAL LOW (ref 135–145)

## 2016-01-17 LAB — URINALYSIS, ROUTINE W REFLEX MICROSCOPIC
Bilirubin Urine: NEGATIVE
Glucose, UA: >1000 mg/dL — AB
Hgb urine dipstick: NEGATIVE
Ketones, ur: NEGATIVE mg/dL
Leukocytes, UA: NEGATIVE
Nitrite: NEGATIVE
Protein, ur: NEGATIVE mg/dL
Specific Gravity, Urine: 1.037 — ABNORMAL HIGH (ref 1.005–1.030)
pH: 5 (ref 5.0–8.0)

## 2016-01-17 LAB — CBG MONITORING, ED: Glucose-Capillary: 514 mg/dL (ref 65–99)

## 2016-01-17 NOTE — ED Triage Notes (Signed)
Pt stats he came in for his "sugar"; pt stats he started having chills and tingling in the feet 4 days ago; Pt states BS was 695 when he last checked at work today; pt a&ox 4 on arrival; Pt speaking full and complete sentences; Pt denies pain on arrival.

## 2016-01-18 ENCOUNTER — Emergency Department (HOSPITAL_COMMUNITY)
Admission: EM | Admit: 2016-01-18 | Discharge: 2016-01-18 | Disposition: A | Discharge status: Home / Self Care | Payer: Medicare Other | Attending: Emergency Medicine | Admitting: Emergency Medicine

## 2016-01-18 DIAGNOSIS — E1165 Type 2 diabetes mellitus with hyperglycemia: Secondary | ICD-10-CM | POA: Diagnosis not present

## 2016-01-18 DIAGNOSIS — R739 Hyperglycemia, unspecified: Secondary | ICD-10-CM

## 2016-01-18 LAB — CBG MONITORING, ED
Glucose-Capillary: 376 mg/dL — ABNORMAL HIGH (ref 65–99)
Glucose-Capillary: 164 mg/dL — ABNORMAL HIGH (ref 65–99)

## 2016-01-18 MED ORDER — INSULIN ASPART 100 UNIT/ML ~~LOC~~ SOLN
10.0000 [IU] | Freq: Once | SUBCUTANEOUS | Status: AC
  Administered 2016-01-18: 10 [IU] via INTRAVENOUS
  Filled 2016-01-18: qty 1

## 2016-01-18 MED ORDER — SODIUM CHLORIDE 0.9 % IV BOLUS (SEPSIS)
1000.0000 mL | Freq: Once | INTRAVENOUS | Status: AC
  Administered 2016-01-18: 1000 mL via INTRAVENOUS

## 2016-01-18 MED ORDER — INSULIN GLARGINE 100 UNIT/ML ~~LOC~~ SOLN
60.0000 [IU] | Freq: Once | SUBCUTANEOUS | Status: AC
  Administered 2016-01-18: 60 [IU] via SUBCUTANEOUS
  Filled 2016-01-18: qty 0.6

## 2016-01-18 NOTE — ED Provider Notes (Signed)
TIME SEEN: 1:45 AM  CHIEF COMPLAINT: hyperglycemia  By signing my name below, I, Keith [name], attest that this documentation has been prepared under the direction and in the presence of [employer], DO . Electronically Signed: Royce [name], Scribe. 01/18/2016. 2:11 AM.  HPI:   Keith [name] is a 50 y.o. male with a history of HIV, Hypertension and IDDM on 60 units qhs Lantus who presents to the Emergency Department complaining of hyperglycemia.  Pt reports he checked his blood sugar at home and it was 695.  Pt has been seen in the emergency department several times for hyperglycemia. Pt reports that he is taking Lantus, but hasn't had a dose for 2 days because he ran out of needles.  States now he has needles.  He is no longer taking humalog.  He denies pain, cough, vomiting and diarrhea.      Pt has an appointment with his PCP, Keith [name], tomorrow at 3pm.    ROS: See HPI Constitutional: no fever. Positive hyperglycemia.    Eyes: no drainage  ENT: no runny nose   Cardiovascular:  no chest pain  Resp: no SOB  GI: no vomiting GU: no dysuria Integumentary: no rash  Allergy: no hives  Musculoskeletal: no leg swelling  Neurological: no slurred speech ROS otherwise negative  PAST MEDICAL HISTORY/PAST SURGICAL HISTORY:  Past Medical History:  Diagnosis Date  . AIDS ([place])   . Chronic knee pain    right  . Chronic pain   . Diabetes mellitus   . Diabetes type 2, uncontrolled ([place])    HgA1c 17.6 (04/27/2010)  . Erectile dysfunction   . Genital warts   . HIV (human immunodeficiency virus infection) ([place]) 2009   CD4 count 100, VL 13800 (05/01/2010)  . Hypertension   . Osteomyelitis ([place])    h/o hand    MEDICATIONS:  Prior to Admission medications   Medication Sig Start Date End Date Taking? Authorizing Provider  abacavir-dolutegravir-lamiVUDine (TRIUMEQ) 600-50-300 MG tablet Take 1 tablet by mouth daily. 06/01/15   [name], MD  aspirin 81 MG EC  tablet Take 81 mg by mouth daily.  08/20/11   [name], MD  dapsone 100 MG tablet TAKE 1 TABLET BY MOUTH EVERY DAY. NEED APPOINTMENT 09/19/14   [name], MD  darunavir-cobicistat (PREZCOBIX) 800-150 MG tablet Take 1 tablet by mouth daily. Take with food. 06/01/15   [name], MD  imiquimod Leroy Sea) 5 % cream Apply topically 3 (three) times a week. 01/30/15   [name], MD  Insulin Glargine (LANTUS SOLOSTAR) 100 UNIT/ML Solostar Pen Inject 60 Units into the skin daily at 10 pm. 06/01/14   [name], MD  insulin lispro (HUMALOG) 100 UNIT/ML KiwkPen Inject 0.08 mLs (8 Units total) into the skin 3 (three) times daily. Patient taking differently: Inject 7 Units into the skin 3 (three) times daily.  02/21/14   [name], MD  LORazepam (ATIVAN) 1 MG tablet Take 1 tablet (1 mg total) by mouth every 6 (six) hours as needed for anxiety. 01/30/15   [name], MD  oxyCODONE-acetaminophen (PERCOCET) 10-325 MG tablet Take 1 tablet by mouth every 6 (six) hours as needed for pain.    Historical Provider, MD  pravastatin (PRAVACHOL) 40 MG tablet Take 1 tablet (40 mg total) by mouth daily. 01/03/14   [name], MD  traMADol (ULTRAM) 50 MG tablet Take 1 tablet (50 mg total) by mouth every 6 (six) hours as needed.  Patient taking differently: Take 50 mg by mouth every 6 (six) hours as needed for moderate pain.  03/03/15   [name], PA-C    ALLERGIES:  Allergies  Allergen Reactions  . Sulfa Antibiotics Itching    SOCIAL HISTORY:  Social History  Substance Use Topics  . Smoking status: Never Smoker  . Smokeless tobacco: Never Used  . Alcohol use No    FAMILY HISTORY: Family History  Problem Relation Age of Onset  . Hypertension Mother   . Arthritis Father   . Hypertension Father   . Hypertension Brother   . Cancer Maternal Grandmother 26    unknown type of cancer  . Depression Paternal Grandmother     EXAM: BP 127/87   Pulse 90   Temp 97.6 F  (36.4 C) (Oral)   Resp 19   Ht 5\' 9"  (1.753 m)   Wt 245 lb (111.1 kg)   SpO2 98%   BMI 36.18 kg/m  CONSTITUTIONAL: Alert and oriented and responds appropriately to questions. Well-appearing; well-nourished, Obese, in no distress HEAD: Normocephalic EYES: Conjunctivae clear, PERRL ENT: normal nose; no rhinorrhea; moist mucous membranes NECK: Supple, no meningismus, no LAD  CARD: RRR; S1 and S2 appreciated; no murmurs, no clicks, no rubs, no gallops RESP: Normal chest excursion without splinting or tachypnea; breath sounds clear and equal bilaterally; no wheezes, no rhonchi, no rales, no hypoxia or respiratory distress, speaking full sentences ABD/GI: Normal bowel sounds; non-distended; soft, non-tender, no rebound, no guarding, no peritoneal signs BACK:  The back appears normal and is non-tender to palpation, there is no CVA tenderness EXT: Normal ROM in all joints; non-tender to palpation; no edema; normal capillary refill; no cyanosis, no calf tenderness or swelling    SKIN: Normal color for age and race; warm; no rash NEURO: Moves all extremities equally, sensation to light touch intact diffusely, cranial nerves II through XII intact PSYCH: The patient's mood and manner are appropriate. Grooming and personal hygiene are appropriate.  MEDICAL DECISION MAKING: Patient here with hyperglycemia. Has history of medical noncompliance. Is here frequently for the same. Not in DKA. We'll give IV fluids, IV NovoLog. We'll give him a dose of his subcutaneous Lantus. No infectious symptoms causing his hyperglycemia. Anticipate discharge home once blood glucose has improved. Has an appointment tomorrow afternoon with his PCP.  ED PROGRESS: 4:15 AM  Pt's blood glucose is now 164. He is hemodynamically stable. Reports he has needles, Lantus at home. Has appointment with his PCP today at 3 PM. Have strongly encouraged him to be medically compliant and follow up with his PCP. Discussed at length return  precautions. He verbalizes understanding and is comfortable with this plan.   At this time, I do not feel there is any life-threatening condition present. I have reviewed and discussed all results (EKG, imaging, lab, urine as appropriate), exam findings with patient/family. I have reviewed nursing notes and appropriate previous records.  I feel the patient is safe to be discharged home without further emergent workup and can continue workup as an outpatient as needed. Discussed usual and customary return precautions. Patient/family verbalize understanding and are comfortable with this plan.  Outpatient follow-up has been provided. All questions have been answered.     I personally performed the services described in this documentation, which was scribed in my presence. The recorded information has been reviewed and is accurate.     [place], DO 01/18/16 [phone]

## 2016-01-26 ENCOUNTER — Encounter (HOSPITAL_COMMUNITY): Payer: Self-pay | Admitting: Emergency Medicine

## 2016-01-26 ENCOUNTER — Emergency Department (HOSPITAL_COMMUNITY)
Admission: EM | Admit: 2016-01-26 | Discharge: 2016-01-27 | Disposition: A | Discharge status: Home / Self Care | Payer: Medicare Other | Attending: Emergency Medicine | Admitting: Emergency Medicine

## 2016-01-26 ENCOUNTER — Emergency Department (HOSPITAL_COMMUNITY): Payer: Medicare Other

## 2016-01-26 DIAGNOSIS — J0110 Acute frontal sinusitis, unspecified: Secondary | ICD-10-CM

## 2016-01-26 DIAGNOSIS — J069 Acute upper respiratory infection, unspecified: Secondary | ICD-10-CM

## 2016-01-26 DIAGNOSIS — R51 Headache: Secondary | ICD-10-CM | POA: Diagnosis not present

## 2016-01-26 DIAGNOSIS — I10 Essential (primary) hypertension: Secondary | ICD-10-CM | POA: Insufficient documentation

## 2016-01-26 DIAGNOSIS — R05 Cough: Secondary | ICD-10-CM | POA: Diagnosis not present

## 2016-01-26 DIAGNOSIS — Z7982 Long term (current) use of aspirin: Secondary | ICD-10-CM | POA: Insufficient documentation

## 2016-01-26 DIAGNOSIS — Z794 Long term (current) use of insulin: Secondary | ICD-10-CM | POA: Diagnosis not present

## 2016-01-26 DIAGNOSIS — E119 Type 2 diabetes mellitus without complications: Secondary | ICD-10-CM | POA: Insufficient documentation

## 2016-01-26 DIAGNOSIS — R519 Headache, unspecified: Secondary | ICD-10-CM

## 2016-01-26 MED ORDER — ALBUTEROL SULFATE (2.5 MG/3ML) 0.083% IN NEBU
5.0000 mg | INHALATION_SOLUTION | Freq: Once | RESPIRATORY_TRACT | Status: AC
  Administered 2016-01-26: 5 mg via RESPIRATORY_TRACT
  Filled 2016-01-26: qty 6

## 2016-01-26 MED ORDER — AMOXICILLIN-POT CLAVULANATE 875-125 MG PO TABS
1.0000 | ORAL_TABLET | Freq: Once | ORAL | Status: AC
  Administered 2016-01-26: 1 via ORAL
  Filled 2016-01-26: qty 1

## 2016-01-26 MED ORDER — BENZONATATE 100 MG PO CAPS
200.0000 mg | ORAL_CAPSULE | Freq: Once | ORAL | Status: AC
  Administered 2016-01-26: 200 mg via ORAL
  Filled 2016-01-26: qty 2

## 2016-01-26 NOTE — ED Notes (Signed)
Pt st's he started coughing earlier today.  Pt denies any fevers

## 2016-01-26 NOTE — ED Provider Notes (Signed)
[place] DEPT Provider Note   CSN: [id number] Arrival date & time: 01/26/16  2151     History   Chief Complaint Chief Complaint  Patient presents with  . URI    HPI Keith [name] is a 50 y.o. male.   URI   This is a new problem. The current episode started yesterday. The problem has been gradually worsening. There has been no fever. The fever has been present for less than 1 day. Associated symptoms include diarrhea, congestion, headaches, rhinorrhea, sinus pain, sneezing, sore throat and cough. Pertinent negatives include no chest pain, no abdominal pain, no nausea, no vomiting, no dysuria, no ear pain, no plugged ear sensation, no swollen glands, no joint pain, no joint swelling, no neck pain, no rash and no wheezing.     Pt with 2 days of URI sx  Headache Onset - yesterday, gradual Location - across forehead Duration -  1.5 days Freq - constant Characterized - throbbing Severity - 8/10 Aggravating - worse with blowing nose, coughing Alleviating - none Associated sx - nasal congestion, sneezing, subjective fever, cold chills,  Treatments tried - none Pertinent negatives:  No neck pain, trauma, N, V, numbness, tingling, tremors, weakness, paralysis, syncope, seizure   CD4 220 on 08/29/2015 Hx of poor med compliance with frequent ER visits  Past Medical History:  Diagnosis Date  . AIDS ([place])   . Chronic knee pain    right  . Chronic pain   . Diabetes mellitus   . Diabetes type 2, uncontrolled ([place])    HgA1c 17.6 (04/27/2010)  . Erectile dysfunction   . Genital warts   . HIV (human immunodeficiency virus infection) ([place]) 2009   CD4 count 100, VL 13800 (05/01/2010)  . Hypertension   . Osteomyelitis [facility])    h/o hand    Patient Active Problem List   Diagnosis Date Noted  . Onychomycosis of multiple toenails with type 2 diabetes mellitus ([place]) 08/29/2015  . Abscess of finger of right hand 03/08/2015  . Hyperglycemia 05/30/2014  . Neck abscess   .  Cellulitis, neck   . MRSA carrier 04/20/2014  . Testosterone deficiency 04/20/2014  . Furuncle of neck 04/19/2014  . Genital warts 02/22/2014  . HIV disease ([place])   . Hyperglycemia due to type 2 diabetes mellitus ([place]) 02/04/2014  . Dental anomaly 11/20/2012  . Arthritis of right knee 02/23/2012  . Hyperlipidemia 11/10/2011  . Chronic pain 08/07/2011  . Meralgia paraesthetica 04/23/2011  . Type 2 diabetes with circulatory disorder causing erectile dysfunction ([place]) 06/25/2010  . ERECTILE DYSFUNCTION 08/22/2008  . Essential hypertension 05/19/2008    Past Surgical History:  Procedure Laterality Date  . HERNIA REPAIR    . I&D EXTREMITY Left 08/21/2014   Procedure: INCISION AND DRAINAGE LEFT SMALL FINGER;  Surgeon: Leanora Cover, MD;  Location: [facility];  Service: Orthopedics;  Laterality: Left;  . MINOR IRRIGATION AND DEBRIDEMENT OF WOUND Right 04/22/2014   Procedure: IRRIGATION AND DEBRIDEMENT OF RIGHT NECK ABCESS;  Surgeon: Jerrell Belfast, MD;  Location: [facility];  Service: ENT;  Laterality: Right;  . MULTIPLE EXTRACTIONS WITH ALVEOLOPLASTY N/A 01/18/2013   Procedure: MULTIPLE EXTRACION 3, 6, 7, 10, 11, 13, 21, 22, 27, 28, 29, 30 WITH ALVEOLOPLASTY;  Surgeon: Gae Bon, DDS;  Location: [facility];  Service: Oral Surgery;  Laterality: N/A;       Home Medications    Prior to Admission medications   Medication Sig Start Date End Date Taking? Authorizing Provider  abacavir-dolutegravir-lamiVUDine [facility]) 600-50-300  MG tablet Take 1 tablet by mouth daily. 06/01/15  Yes [name], MD  aspirin 81 MG EC tablet Take 81 mg by mouth daily.  08/20/11  Yes [name], MD  dapsone 100 MG tablet TAKE 1 TABLET BY MOUTH EVERY DAY. NEED APPOINTMENT 09/19/14  Yes [name], MD  darunavir-cobicistat (PREZCOBIX) 800-150 MG tablet Take 1 tablet by mouth daily. Take with food. 06/01/15  Yes [name], MD  imiquimod Leroy Sea) 5 % cream Apply topically 3 (three) times a week.  01/30/15  Yes [name], MD  Insulin Glargine (LANTUS SOLOSTAR) 100 UNIT/ML Solostar Pen Inject 60 Units into the skin daily at 10 pm. 06/01/14  Yes [name], MD  insulin lispro (HUMALOG) 100 UNIT/ML KiwkPen Inject 0.08 mLs (8 Units total) into the skin 3 (three) times daily. Patient taking differently: Inject 7 Units into the skin 3 (three) times daily.  02/21/14  Yes [name], MD  LORazepam (ATIVAN) 1 MG tablet Take 1 tablet (1 mg total) by mouth every 6 (six) hours as needed for anxiety. 01/30/15  Yes [name], MD  oxyCODONE-acetaminophen (PERCOCET) 10-325 MG tablet Take 1 tablet by mouth every 6 (six) hours as needed for pain.   Yes Historical Provider, MD  pravastatin (PRAVACHOL) 40 MG tablet Take 1 tablet (40 mg total) by mouth daily. 01/03/14  Yes [name], MD  traMADol (ULTRAM) 50 MG tablet Take 1 tablet (50 mg total) by mouth every 6 (six) hours as needed. Patient taking differently: Take 50 mg by mouth every 6 (six) hours as needed for moderate pain.  03/03/15  Yes [name], PA-C  amoxicillin-clavulanate (AUGMENTIN) 875-125 MG tablet Take 1 tablet by mouth every 12 (twelve) hours. 01/27/16   [name], PA-C  benzonatate (TESSALON) 100 MG capsule Take 2 capsules (200 mg total) by mouth 2 (two) times daily as needed for cough. 01/27/16   [name], PA-C  guaiFENesin-codeine 100-10 MG/5ML syrup Take 5 mLs by mouth 3 (three) times daily as needed for cough. 01/27/16   [name], PA-C    Family History Family History  Problem Relation Age of Onset  . Hypertension Mother   . Arthritis Father   . Hypertension Father   . Hypertension Brother   . Cancer Maternal Grandmother 48    unknown type of cancer  . Depression Paternal Grandmother     Social History Social History  Substance Use Topics  . Smoking status: Never Smoker  . Smokeless tobacco: Never Used  . Alcohol use No     Allergies   Sulfa antibiotics   Review of Systems Review  of Systems  Constitutional: Positive for activity change, chills and fever (subjective). Negative for appetite change, diaphoresis, fatigue and unexpected weight change.  HENT: Positive for congestion, postnasal drip, rhinorrhea, sinus pressure, sneezing and sore throat. Negative for ear pain, facial swelling, hearing loss, nosebleeds, tinnitus, trouble swallowing and voice change.   Respiratory: Positive for cough. Negative for choking, chest tightness and wheezing.   Cardiovascular: Negative for chest pain, palpitations and leg swelling.  Gastrointestinal: Positive for diarrhea. Negative for abdominal pain, nausea and vomiting.  Genitourinary: Negative for dysuria.  Musculoskeletal: Negative for joint pain and neck pain.  Skin: Negative for rash.  Neurological: Positive for headaches.  All other systems reviewed and are negative.    Physical Exam Updated Vital Signs BP 143/99 (BP Location: Right Arm)   Pulse 84   Temp 98.1 F (36.7 C) (Oral)  Resp 18   Ht 5\' 10"  (1.778 m)   Wt 120.4 kg   SpO2 99%   BMI 38.10 kg/m   Physical Exam  Constitutional: He is oriented to person, place, and time. He appears well-developed and well-nourished. No distress.  HENT:  Head: Normocephalic and atraumatic.  Right Ear: External ear normal. Tympanic membrane is not erythematous, not retracted and not bulging. A middle ear effusion is present.  Left Ear: External ear normal. Tympanic membrane is not erythematous, not retracted and not bulging. A middle ear effusion is present.  Nose: Rhinorrhea present. No septal deviation. Right sinus exhibits frontal sinus tenderness. Right sinus exhibits no maxillary sinus tenderness. Left sinus exhibits frontal sinus tenderness. Left sinus exhibits no maxillary sinus tenderness.  Mouth/Throat: No oropharyngeal exudate.  Eyes: Conjunctivae and EOM are normal. Pupils are equal, round, and reactive to light. Right eye exhibits no discharge. Left eye exhibits no  discharge. No scleral icterus.  Neck: Normal range of motion. Neck supple. No JVD present. No tracheal deviation present.  Cardiovascular: Normal rate and regular rhythm.  Exam reveals no gallop, no friction rub and no decreased pulses.   No murmur heard. Pulmonary/Chest: Effort normal and breath sounds normal. No stridor. No tachypnea. No respiratory distress. He has no decreased breath sounds. He has no wheezes. He has no rhonchi. He has no rales.  Frequent cough  Abdominal: Soft. Normal appearance and bowel sounds are normal. There is no tenderness.  Musculoskeletal: Normal range of motion. He exhibits no edema.  Lymphadenopathy:    He has no cervical adenopathy.  Neurological: He is alert and oriented to person, place, and time. He exhibits normal muscle tone. Coordination normal.  Skin: Skin is warm and dry. No rash noted. He is not diaphoretic. No erythema. No pallor.  Psychiatric: He has a normal mood and affect. His behavior is normal. Judgment and thought content normal.  Nursing note and vitals reviewed.    ED Treatments / Results  Labs (all labs ordered are listed, but only abnormal results are displayed) Labs Reviewed  CBG MONITORING, ED - Abnormal; Notable for the following:       Result Value   Glucose-Capillary 240 (*)    All other components within normal limits  INFLUENZA PANEL BY PCR (TYPE A & B, H1N1)    EKG  EKG Interpretation None       Radiology Dg Chest 2 View  Result Date: 01/26/2016 CLINICAL DATA:  50 year old male with congestion cough and chest pain. EXAM: CHEST  2 VIEW COMPARISON:  Chest radiograph dated 01/23/2014 FINDINGS: The heart size and mediastinal contours are within normal limits. Both lungs are clear. The visualized skeletal structures are unremarkable. IMPRESSION: No active cardiopulmonary disease. Electronically Signed   By: Anner Crete M.D.   On: 01/26/2016 22:54    Procedures Procedures (including critical care  time)  Medications Ordered in ED Medications  benzonatate (TESSALON) capsule 200 mg (200 mg Oral Given 01/26/16 2332)  albuterol (PROVENTIL) (2.5 MG/3ML) 0.083% nebulizer solution 5 mg (5 mg Nebulization Given 01/26/16 2338)  amoxicillin-clavulanate (AUGMENTIN) 875-125 MG per tablet 1 tablet (1 tablet Oral Given 01/26/16 2332)     Initial Impression / Assessment and Plan / ED Course  I have reviewed the triage vital signs and the nursing notes.  Pertinent labs & imaging results that were available during my care of the patient were reviewed by me and considered in my medical decision making (see chart for details).  Clinical Course  Patient here  with URI symptoms, nasal congestion, sinus pain and pressure, headache that is severe and located across his forehead, also cough with yellow sputum.  Suspect URI/sinusitis and most coughing is likely from postnasal drip, lungs CTA. He has profuse nasal congestion with clear nasal discharge, he is generally well-appearing, non-toxic, no respiratory distress.  He does have HIV and poorly controlled diabetes, we will obtain flu testing as he would be a candidate for treatment as sx have been less than 48 hours, however low suspicion of this being flu and feel it is more of a head cold.  Chest x-ray obtained at triage was negative.  CBG - 240, he frequent with presents with hyperglycemia that is poorly controlled, pt due for his home meds, which he will give when discharged home.   Influenza testing was negative. Patient discharged home with cough medicine, and Augmentin to treat sinusitis given his comorbidities. He is encouraged follow-up with his PCP. He states that he does have follow-up in 4 days.  He is agreement with plan to discharge home with these medications. He verbalizes understanding of return precautions. He is discharged home in good condition with stable vital signs.  Final Clinical Impressions(s) / ED Diagnoses   Final diagnoses:  Upper  respiratory tract infection, unspecified type  Acute frontal sinusitis, recurrence not specified  Sinus headache    New Prescriptions Discharge Medication List as of 01/27/2016  1:09 AM    START taking these medications   Details  amoxicillin-clavulanate (AUGMENTIN) 875-125 MG tablet Take 1 tablet by mouth every 12 (twelve) hours., Starting Sat 01/27/2016, Print    benzonatate (TESSALON) 100 MG capsule Take 2 capsules (200 mg total) by mouth 2 (two) times daily as needed for cough., Starting Sat 01/27/2016, Print    guaiFENesin-codeine 100-10 MG/5ML syrup Take 5 mLs by mouth 3 (three) times daily as needed for cough., Starting Sat 01/27/2016, Print         [name], PA-C 01/27/16 2034    Sherwood Gambler, MD 02/01/16 Shelah Lewandowsky

## 2016-01-26 NOTE — ED Notes (Addendum)
Sent to XRAY from triage at this time, will be taken to treatment room by radiology transport tech after imaging is completed.

## 2016-01-26 NOTE — ED Triage Notes (Signed)
Pt reports headache and flu like symptoms starting yesterday, states cough with white phlegm. Subjective fevers, none documented. Reports headache with 9/10 pain.

## 2016-01-27 DIAGNOSIS — J0110 Acute frontal sinusitis, unspecified: Secondary | ICD-10-CM | POA: Diagnosis not present

## 2016-01-27 LAB — INFLUENZA PANEL BY PCR (TYPE A & B)
H1N1 flu by pcr: NOT DETECTED
Influenza A By PCR: NEGATIVE
Influenza B By PCR: NEGATIVE

## 2016-01-27 LAB — CBG MONITORING, ED: Glucose-Capillary: 240 mg/dL — ABNORMAL HIGH (ref 65–99)

## 2016-01-27 MED ORDER — BENZONATATE 100 MG PO CAPS: 200.0000 mg | ORAL_CAPSULE | Freq: Two times a day (BID) | ORAL | 0 refills | Status: DC | PRN

## 2016-01-27 MED ORDER — GUAIFENESIN-CODEINE 100-10 MG/5ML PO SOLN: 5.0000 mL | Freq: Three times a day (TID) | ORAL | 0 refills | Status: DC | PRN

## 2016-01-27 MED ORDER — AMOXICILLIN-POT CLAVULANATE 875-125 MG PO TABS: 1.0000 | ORAL_TABLET | Freq: Two times a day (BID) | ORAL | 0 refills | Status: DC

## 2016-01-27 NOTE — ED Notes (Signed)
PA at bedside updating pt 

## 2016-01-27 NOTE — ED Notes (Signed)
E-signature pad in room not working.  Pt verbalized understanding of discharge paperwork and prescriptions, plus follow up.  All questions answered.

## 2016-01-27 NOTE — ED Notes (Signed)
Patient able to ambulate independently  

## 2016-02-02 IMAGING — DX DG FINGER INDEX 2+V*R*
3 series · 3 of 3 positions shown · non-contrast
Comparison: None.

CLINICAL DATA: Swelling and pain of the right index finger for 3
weeks.

EXAM:
RIGHT INDEX FINGER 2+V

[x finger pa right]
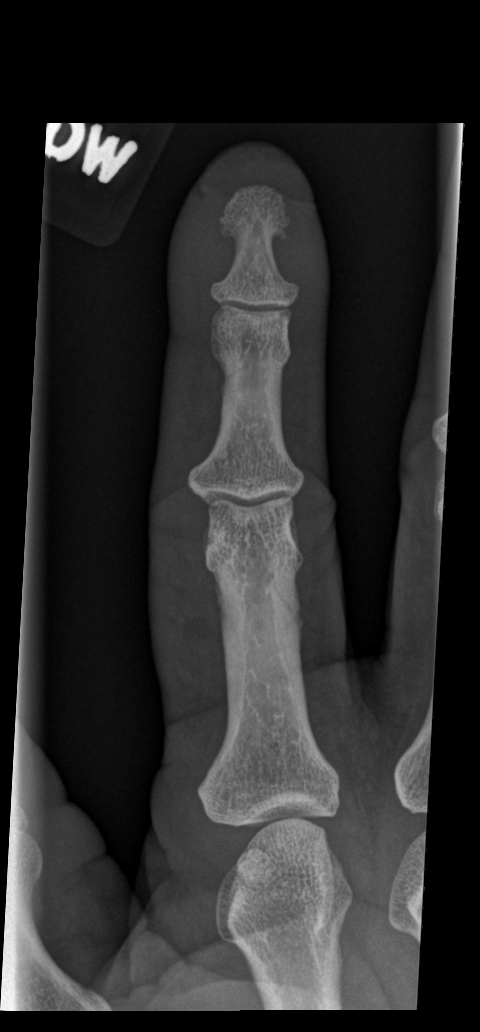

[x finger obl right]
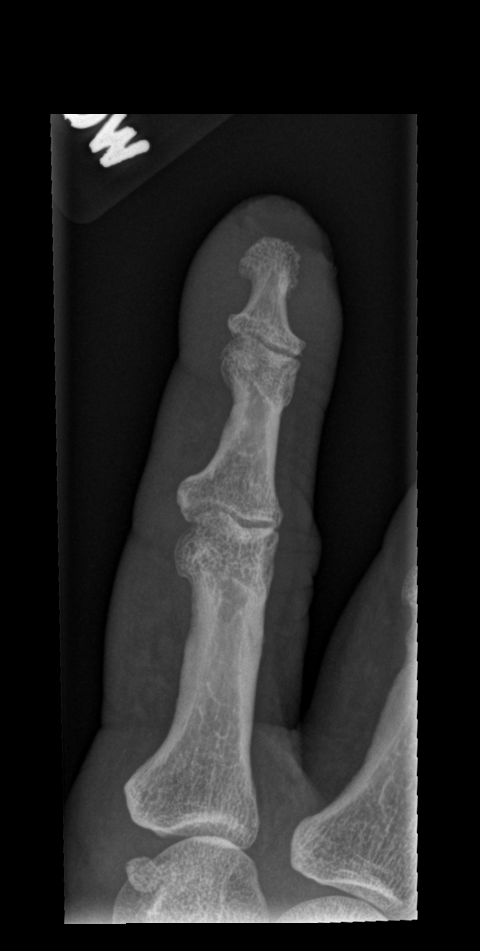

[x finger lat right]
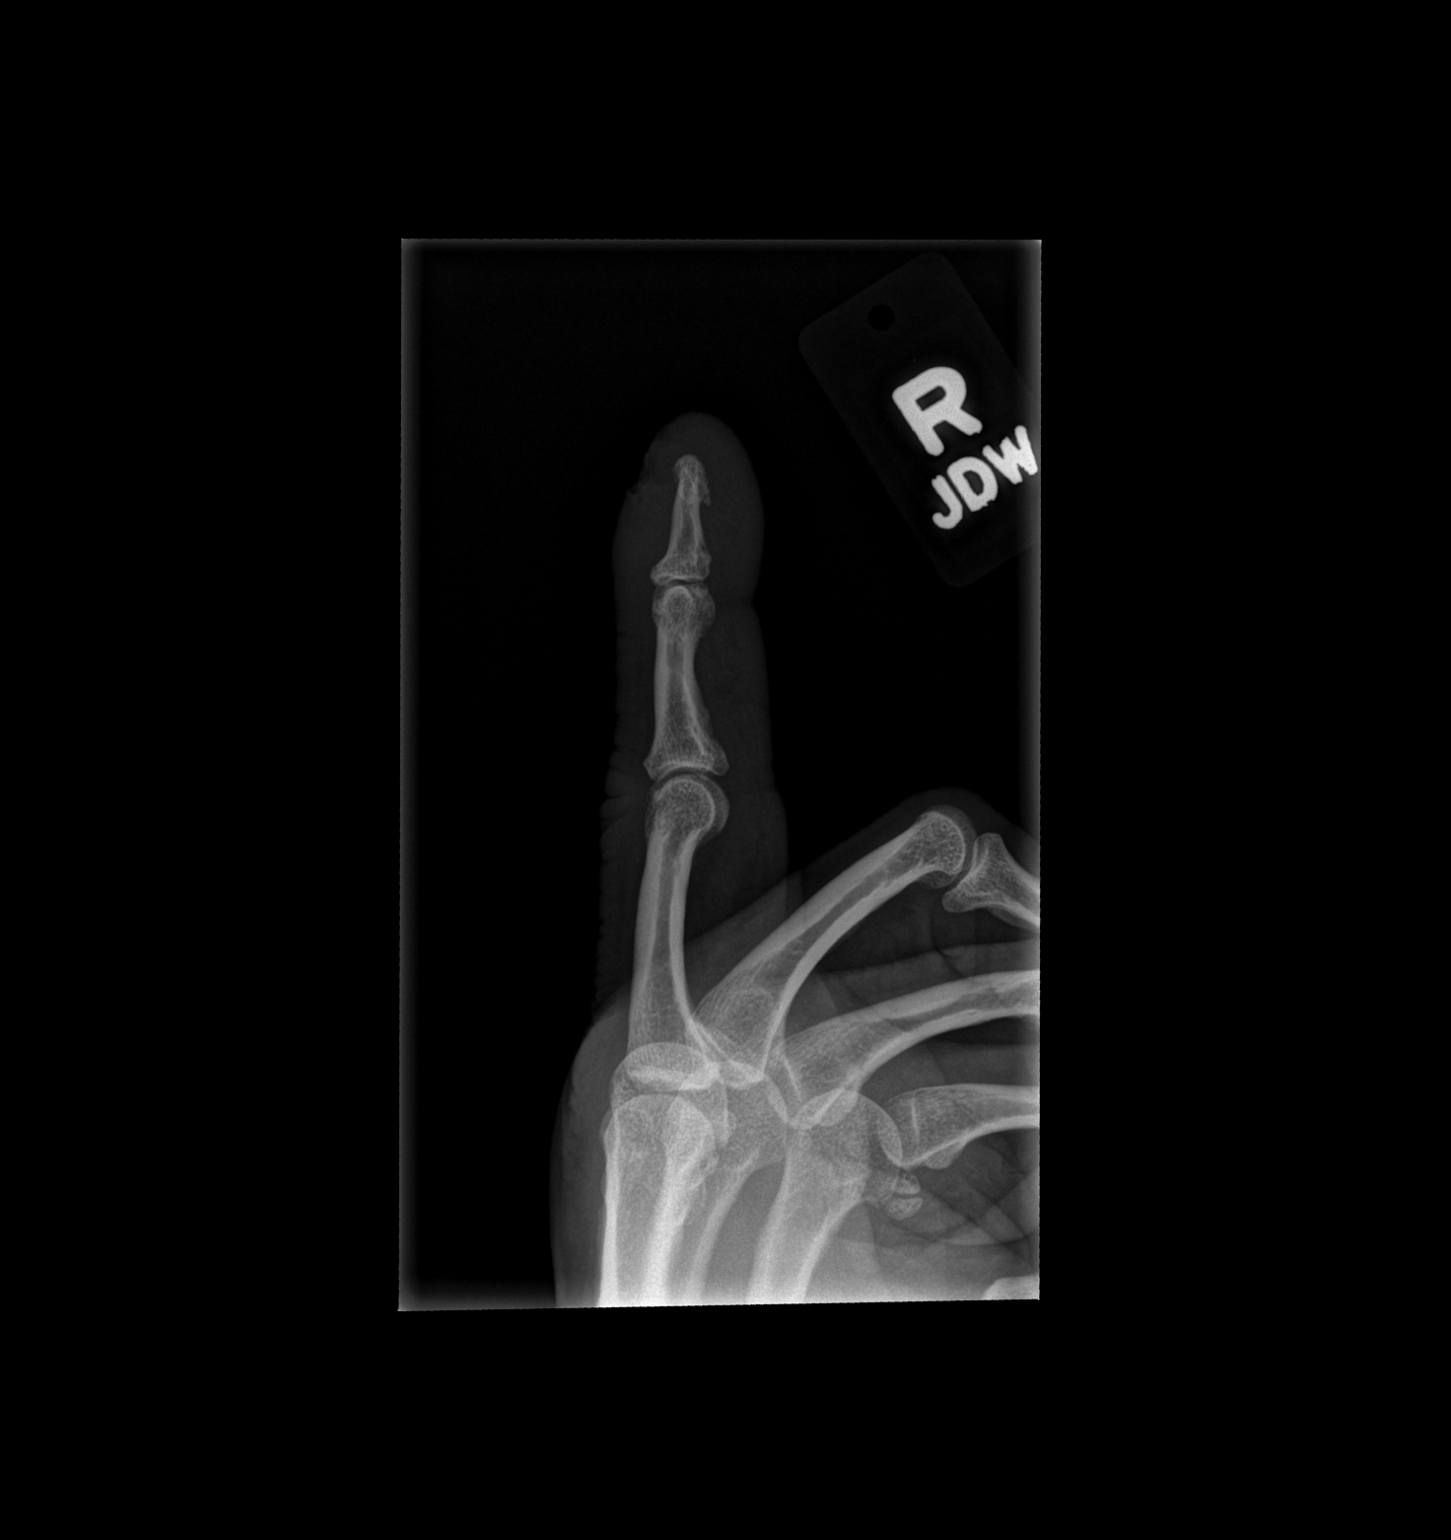

[3 of 3 positions shown; findings below may reference images not displayed]

FINDINGS: There is no evidence of fracture or dislocation. There is no
evidence of arthropathy or other focal bone abnormality. Soft
tissues are unremarkable.
IMPRESSION: Negative.

## 2016-02-14 DIAGNOSIS — G44209 Tension-type headache, unspecified, not intractable: Secondary | ICD-10-CM | POA: Diagnosis not present

## 2016-02-14 DIAGNOSIS — B20 Human immunodeficiency virus [HIV] disease: Secondary | ICD-10-CM | POA: Diagnosis not present

## 2016-02-14 DIAGNOSIS — E109 Type 1 diabetes mellitus without complications: Secondary | ICD-10-CM | POA: Diagnosis not present

## 2016-02-14 DIAGNOSIS — M545 Low back pain: Secondary | ICD-10-CM | POA: Diagnosis not present

## 2016-03-03 ENCOUNTER — Emergency Department (HOSPITAL_COMMUNITY): Payer: Medicare Other

## 2016-03-03 ENCOUNTER — Emergency Department (HOSPITAL_COMMUNITY)
Admission: EM | Admit: 2016-03-03 | Discharge: 2016-03-04 | Disposition: A | Discharge status: Home / Self Care | Payer: Medicare Other | Attending: Emergency Medicine | Admitting: Emergency Medicine

## 2016-03-03 ENCOUNTER — Encounter (HOSPITAL_COMMUNITY): Payer: Self-pay | Admitting: Emergency Medicine

## 2016-03-03 DIAGNOSIS — Z794 Long term (current) use of insulin: Secondary | ICD-10-CM | POA: Insufficient documentation

## 2016-03-03 DIAGNOSIS — Y939 Activity, unspecified: Secondary | ICD-10-CM | POA: Diagnosis not present

## 2016-03-03 DIAGNOSIS — W010XXA Fall on same level from slipping, tripping and stumbling without subsequent striking against object, initial encounter: Secondary | ICD-10-CM | POA: Insufficient documentation

## 2016-03-03 DIAGNOSIS — R05 Cough: Secondary | ICD-10-CM | POA: Diagnosis not present

## 2016-03-03 DIAGNOSIS — J069 Acute upper respiratory infection, unspecified: Secondary | ICD-10-CM

## 2016-03-03 DIAGNOSIS — E119 Type 2 diabetes mellitus without complications: Secondary | ICD-10-CM | POA: Diagnosis not present

## 2016-03-03 DIAGNOSIS — S8001XA Contusion of right knee, initial encounter: Secondary | ICD-10-CM

## 2016-03-03 DIAGNOSIS — Y999 Unspecified external cause status: Secondary | ICD-10-CM | POA: Insufficient documentation

## 2016-03-03 DIAGNOSIS — I10 Essential (primary) hypertension: Secondary | ICD-10-CM | POA: Insufficient documentation

## 2016-03-03 DIAGNOSIS — Z7982 Long term (current) use of aspirin: Secondary | ICD-10-CM | POA: Insufficient documentation

## 2016-03-03 DIAGNOSIS — Y929 Unspecified place or not applicable: Secondary | ICD-10-CM | POA: Diagnosis not present

## 2016-03-03 DIAGNOSIS — S8991XA Unspecified injury of right lower leg, initial encounter: Secondary | ICD-10-CM | POA: Diagnosis present

## 2016-03-03 DIAGNOSIS — M25561 Pain in right knee: Secondary | ICD-10-CM | POA: Diagnosis not present

## 2016-03-03 LAB — CBG MONITORING, ED: Glucose-Capillary: 202 mg/dL — ABNORMAL HIGH (ref 65–99)

## 2016-03-03 MED ORDER — IBUPROFEN 800 MG PO TABS
800.0000 mg | ORAL_TABLET | Freq: Once | ORAL | Status: AC
  Administered 2016-03-03: 800 mg via ORAL
  Filled 2016-03-03: qty 1

## 2016-03-03 MED ORDER — BENZONATATE 100 MG PO CAPS
100.0000 mg | ORAL_CAPSULE | Freq: Once | ORAL | Status: AC
  Administered 2016-03-03: 100 mg via ORAL
  Filled 2016-03-03: qty 1

## 2016-03-03 MED ORDER — OXYMETAZOLINE HCL 0.05 % NA SOLN
3.0000 | Freq: Once | NASAL | Status: AC
  Administered 2016-03-03: 3 via NASAL
  Filled 2016-03-03: qty 15

## 2016-03-03 NOTE — ED Notes (Signed)
Patient transported to X-ray 

## 2016-03-03 NOTE — ED Triage Notes (Addendum)
C/o runny nose, productive cough with white phlegm, and diarrhea x 3 days.  States he was given cough medicine but needs something stronger.

## 2016-03-03 NOTE — ED Provider Notes (Signed)
By signing my name below, I, Maud Deed. Royston Sinner, attest that this documentation has been prepared under the direction and in the presence of [place], DO.  Electronically Signed: Maud Deed. Royston Sinner, ED Scribe. 03/03/16. 11:35 PM.   TIME SEEN: 11:09 PM   CHIEF COMPLAINT:  Chief Complaint  Patient presents with  . Cough  . Diarrhea      HPI:  HPI Comments: Keith [name] is a 50 y.o. male with a PMHx of HIV with undetectable viral load May 2017; most recent CD4 count 220, IDDM, and HTN who presents to the Emergency Department complaining of constant, unchanged productive cough x 3 days. He also reports rhinorrhea, nausea, and ongoing diarrhea. No aggravating or alleviating factors at this time. No OTC medications or home remedies attempted prior to arrival. No recent fever, chills, or vomiting. Pt states he was evaluated in the Emergency Department on 10/20 for same. At that time, he was treated and discharged with Augmentin, Guaifenesin with codeine, and tessalon pearls. He is requesting similar medications this evening. Pt is due to follow up with infectious disease tomorrow at 10:00 AM. He is complaint with all medications.  He also reports constant, unchanged R knee pain x 1 days. Pt attributes pain to a fall sustained earlier today. Discomfort is made worse with ambulation and weight bearing. No alleviating factors at this time. No numbness or loss of sensation.  States he tripped and fell.  No head injury or LOC.  Is ambulatory.  PCP: No PCP Per Patient    ROS: See HPI Constitutional: no fever  Eyes: no drainage  ENT: runny nose   Cardiovascular:  no chest pain  Resp: cough. No SOB GI: diarrhea. No vomiting GU: no dysuria Integumentary: no rash  Allergy: no hives  Musculoskeletal: no leg swelling  Neurological: no slurred speech ROS otherwise negative  PAST MEDICAL HISTORY/PAST SURGICAL HISTORY:  Past Medical History:  Diagnosis Date  . AIDS ([place])   . Chronic knee pain     right  . Chronic pain   . Diabetes mellitus   . Diabetes type 2, uncontrolled ([place])    HgA1c 17.6 (04/27/2010)  . Erectile dysfunction   . Genital warts   . HIV (human immunodeficiency virus infection) ([place]) 2009   CD4 count 100, VL 13800 (05/01/2010)  . Hypertension   . Osteomyelitis ([place])    h/o hand    MEDICATIONS:  Prior to Admission medications   Medication Sig Start Date End Date Taking? Authorizing Provider  abacavir-dolutegravir-lamiVUDine (TRIUMEQ) 600-50-300 MG tablet Take 1 tablet by mouth daily. 06/01/15   [name], MD  amoxicillin-clavulanate (AUGMENTIN) 875-125 MG tablet Take 1 tablet by mouth every 12 (twelve) hours. 01/27/16   [name], PA-C  aspirin 81 MG EC tablet Take 81 mg by mouth daily.  08/20/11   [name], MD  benzonatate (TESSALON) 100 MG capsule Take 2 capsules (200 mg total) by mouth 2 (two) times daily as needed for cough. 01/27/16   [name], PA-C  dapsone 100 MG tablet TAKE 1 TABLET BY MOUTH EVERY DAY. NEED APPOINTMENT 09/19/14   [name], MD  darunavir-cobicistat (PREZCOBIX) 800-150 MG tablet Take 1 tablet by mouth daily. Take with food. 06/01/15   [name], MD  guaiFENesin-codeine 100-10 MG/5ML syrup Take 5 mLs by mouth 3 (three) times daily as needed for cough. 01/27/16   [name], PA-C  imiquimod (ALDARA) 5 % cream Apply topically 3 (three) times a week. 01/30/15  [name], MD  Insulin Glargine (LANTUS SOLOSTAR) 100 UNIT/ML Solostar Pen Inject 60 Units into the skin daily at 10 pm. 06/01/14   [name], MD  insulin lispro (HUMALOG) 100 UNIT/ML KiwkPen Inject 0.08 mLs (8 Units total) into the skin 3 (three) times daily. Patient taking differently: Inject 7 Units into the skin 3 (three) times daily.  02/21/14   [name], MD  LORazepam (ATIVAN) 1 MG tablet Take 1 tablet (1 mg total) by mouth every 6 (six) hours as needed for anxiety. 01/30/15   [name], MD  oxyCODONE-acetaminophen  (PERCOCET) 10-325 MG tablet Take 1 tablet by mouth every 6 (six) hours as needed for pain.    Historical Provider, MD  pravastatin (PRAVACHOL) 40 MG tablet Take 1 tablet (40 mg total) by mouth daily. 01/03/14   [name], MD  traMADol (ULTRAM) 50 MG tablet Take 1 tablet (50 mg total) by mouth every 6 (six) hours as needed. Patient taking differently: Take 50 mg by mouth every 6 (six) hours as needed for moderate pain.  03/03/15   [name], PA-C    ALLERGIES:  Allergies  Allergen Reactions  . Sulfa Antibiotics Itching    SOCIAL HISTORY:  Social History  Substance Use Topics  . Smoking status: Never Smoker  . Smokeless tobacco: Never Used  . Alcohol use No    FAMILY HISTORY: Family History  Problem Relation Age of Onset  . Hypertension Mother   . Arthritis Father   . Hypertension Father   . Hypertension Brother   . Cancer Maternal Grandmother 69    unknown type of cancer  . Depression Paternal Grandmother     EXAM: BP 129/93 (BP Location: Right Arm)   Pulse 100   Temp 98.2 F (36.8 C) (Oral)   Resp 16   SpO2 100%  CONSTITUTIONAL: Alert and oriented and responds appropriately to questions. Well-appearing; well-nourished HEAD: Normocephalic EYES: Conjunctivae clear, PERRL, EOMI ENT: normal nose; no rhinorrhea; moist mucous membranes. Clear nasal congestion in both nostrils. No pharyngeal erythema or petechiae, no tonsillar hypertrophy or exudate, no uvular deviation, no unilateral swelling, no trismus or drooling, no muffled voice, normal phonation, no stridor, no dental caries present, no drainable dental abscess noted, no Ludwig's angina, tongue sits flat in the bottom of the mouth, no angioedema, no facial erythema or warmth, no facial swelling; no pain with movement of the next NECK: Supple, no meningismus, no nuchal rigidity, no LAD  CARD: RRR; S1 and S2 appreciated; no murmurs, no clicks, no rubs, no gallops RESP: Normal chest excursion without splinting or  tachypnea; breath sounds clear and equal bilaterally; no wheezes, no rhonchi, no rales, no hypoxia or respiratory distress, speaking full sentences ABD/GI: Normal bowel sounds; non-distended; soft, non-tender, no rebound, no guarding, no peritoneal signs, no hepatosplenomegaly BACK:  The back appears normal and is non-tender to palpation, there is no CVA tenderness EXT: Tenderness to palpation diffusely over R knee without bony deformity of joint effusion. Extremity warm and well-perfused. No tenderness at the right hip or right ankle. No ligamentous laxity of the right knee. Normal ROM in all joints; otherwise externally are non-tender to palpation; no edema; normal capillary refill; no cyanosis, no calf tenderness or swelling    SKIN: Normal color for age and race; warm; no rash NEURO: Moves all extremities equally, sensation to light touch intact diffusely, cranial nerves II through XII intact, normal speech PSYCH: The patient's mood and manner are appropriate. Grooming and personal hygiene  are appropriate.  MEDICAL DECISION MAKING: Patient here with likely viral upper respiratory infection. Afebrile and nontoxic appearing. Lungs are clear with no increased work of breathing, hypoxia. Will obtain a chest x-ray given his history of HIV. Last CD4 count was 220. No chest pain or shortness of breath.   Patient also with mechanical fall the day. Will obtain x-ray of the right knee.  Will provide ibuprofen for pain as he drove himself to the emergency department.  ED PROGRESS: X-rays are unremarkable. Blood glucose 202. We'll discharge with prescriptions for Tessalon [place], guaifenesin with codeine, ibuprofen. Given Afrin in the emergency department.  Discussed return precautions. He has outpatient follow-up tomorrow. He verbalizes understanding is comfortable with this plan.    At this time, I do not feel there is any life-threatening condition present. I have reviewed and discussed all results (EKG,  imaging, lab, urine as appropriate) and exam findings with patient/family. I have reviewed nursing notes and appropriate previous records.  I feel the patient is safe to be discharged home without further emergent workup and can continue workup as an outpatient as needed. Discussed usual and customary return precautions. Patient/family verbalize understanding and are comfortable with this plan.  Outpatient follow-up has been provided. All questions have been answered.   I personally performed the services described in this documentation, which was scribed in my presence. The recorded information has been reviewed and is accurate.    [place], DO 03/04/16 0028

## 2016-03-04 MED ORDER — IBUPROFEN 800 MG PO TABS: 800.0000 mg | ORAL_TABLET | Freq: Three times a day (TID) | ORAL | 0 refills | Status: DC | PRN

## 2016-03-04 MED ORDER — GUAIFENESIN-CODEINE 100-10 MG/5ML PO SOLN: 10.0000 mL | Freq: Four times a day (QID) | ORAL | 0 refills | Status: DC | PRN

## 2016-03-04 MED ORDER — BENZONATATE 100 MG PO CAPS: 100.0000 mg | ORAL_CAPSULE | Freq: Three times a day (TID) | ORAL | 0 refills | Status: DC | PRN

## 2016-03-04 NOTE — ED Notes (Signed)
ED Provider at bedside. 

## 2016-03-06 DIAGNOSIS — B20 Human immunodeficiency virus [HIV] disease: Secondary | ICD-10-CM | POA: Diagnosis not present

## 2016-03-06 DIAGNOSIS — E109 Type 1 diabetes mellitus without complications: Secondary | ICD-10-CM | POA: Diagnosis not present

## 2016-03-06 DIAGNOSIS — G44209 Tension-type headache, unspecified, not intractable: Secondary | ICD-10-CM | POA: Diagnosis not present

## 2016-03-06 DIAGNOSIS — M545 Low back pain: Secondary | ICD-10-CM | POA: Diagnosis not present

## 2016-03-18 ENCOUNTER — Telehealth: Payer: Self-pay | Admitting: *Deleted

## 2016-03-18 NOTE — Telephone Encounter (Signed)
Walk-in to [facility] to talk with nurse.  Dr. [name] is his PAP now.  Patient shared that he would like better care with his diabetes and his feet are in pain.  RN advised that the patient talk with Dr. [name] about referring him to a Diabetes Specialist and a foot pain specialist when he sees Dr. [name] at his next office visit.  Patient shared that Dr. [name] has a tendency to get "loud" with him in the office and everyone can hear their conversation.  RN advised that the patient needs to "take charge" of his health care and request these referrals from his PCP.  His diabetes is important to his overall health.  The patient shared that he would find a way to talk with his PCP.

## 2016-03-28 ENCOUNTER — Encounter (HOSPITAL_COMMUNITY): Payer: Self-pay

## 2016-03-28 ENCOUNTER — Emergency Department (HOSPITAL_COMMUNITY)
Admission: EM | Admit: 2016-03-28 | Discharge: 2016-03-29 | Disposition: A | Discharge status: Home / Self Care | Payer: Medicare Other | Attending: Emergency Medicine | Admitting: Emergency Medicine

## 2016-03-28 DIAGNOSIS — A630 Anogenital (venereal) warts: Secondary | ICD-10-CM | POA: Diagnosis not present

## 2016-03-28 DIAGNOSIS — I10 Essential (primary) hypertension: Secondary | ICD-10-CM | POA: Diagnosis not present

## 2016-03-28 DIAGNOSIS — Z794 Long term (current) use of insulin: Secondary | ICD-10-CM | POA: Insufficient documentation

## 2016-03-28 DIAGNOSIS — Z7982 Long term (current) use of aspirin: Secondary | ICD-10-CM | POA: Diagnosis not present

## 2016-03-28 DIAGNOSIS — R109 Unspecified abdominal pain: Secondary | ICD-10-CM | POA: Diagnosis not present

## 2016-03-28 DIAGNOSIS — R197 Diarrhea, unspecified: Secondary | ICD-10-CM | POA: Diagnosis not present

## 2016-03-28 DIAGNOSIS — Z79899 Other long term (current) drug therapy: Secondary | ICD-10-CM | POA: Insufficient documentation

## 2016-03-28 DIAGNOSIS — E119 Type 2 diabetes mellitus without complications: Secondary | ICD-10-CM | POA: Diagnosis not present

## 2016-03-28 LAB — URINALYSIS, ROUTINE W REFLEX MICROSCOPIC
Bilirubin Urine: NEGATIVE
Glucose, UA: >=500 mg/dL — AB
Ketones, ur: NEGATIVE mg/dL
Leukocytes, UA: NEGATIVE
Nitrite: NEGATIVE
Protein, ur: 100 mg/dL — AB
Specific Gravity, Urine: 1.013 (ref 1.005–1.030)
pH: 6 (ref 5.0–8.0)

## 2016-03-28 LAB — CBG MONITORING, ED: Glucose-Capillary: 230 mg/dL — ABNORMAL HIGH (ref 65–99)

## 2016-03-28 LAB — CBC
HCT: 36.4 % — ABNORMAL LOW (ref 39.0–52.0)
Hemoglobin: 12.2 g/dL — ABNORMAL LOW (ref 13.0–17.0)
MCH: 27.4 pg (ref 26.0–34.0)
MCHC: 33.5 g/dL (ref 30.0–36.0)
MCV: 81.6 fL (ref 78.0–100.0)
Platelets: 276 10*3/uL (ref 150–400)
RBC: 4.46 MIL/uL (ref 4.22–5.81)
RDW: 13.2 % (ref 11.5–15.5)
WBC: 6.2 10*3/uL (ref 4.0–10.5)

## 2016-03-28 MED ORDER — BENZONATATE 100 MG PO CAPS
200.0000 mg | ORAL_CAPSULE | Freq: Once | ORAL | Status: AC
  Administered 2016-03-29: 200 mg via ORAL
  Filled 2016-03-28: qty 2

## 2016-03-28 MED ORDER — BENZONATATE 100 MG PO CAPS: 100.0000 mg | ORAL_CAPSULE | Freq: Three times a day (TID) | ORAL | 0 refills | Status: DC

## 2016-03-28 MED ORDER — ONDANSETRON HCL 4 MG PO TABS: 4.0000 mg | ORAL_TABLET | Freq: Three times a day (TID) | ORAL | 0 refills | Status: DC | PRN

## 2016-03-28 NOTE — ED Triage Notes (Signed)
Pt states that he has had a non productive cough for two months, pt states he has also been having diarrhea has been going on for two weeks. Vomited x 3 today, abd pain, in the center

## 2016-03-28 NOTE — ED Provider Notes (Signed)
[name] DEPT Provider Note   CSN: [id number] Arrival date & time: 03/28/16  2304     History   Chief Complaint Chief Complaint  Patient presents with  . Cough  . Diarrhea     HPI   Blood pressure 140/100, pulse 99, temperature 97.6 F (36.4 C), temperature source Oral, resp. rate 18, height 5\' 9"  (1.753 m), weight 117.9 kg, SpO2 100 %.  Keith [name] is a 50 y.o. male  PMHx of HIV with undetectable viral load May 2017; most recent CD4 count 220, IDDM, and HTN withmultiple complaints including cold with cough for greater than 3 months he also reports diarrhea several months. Diarrhea is nonbloody, non-melanotic. He endorses a mild gnawing epigastric pain.Marland Kitchen He also reports some hives on the penis which she noticed a day, nonpainful, nonpruritic no discharge testicular pain or swelling. She has appointment with primary care on the 13th.   Past Medical History:  Diagnosis Date  . AIDS ([name])   . Chronic knee pain    right  . Chronic pain   . Diabetes mellitus   . Diabetes type 2, uncontrolled ([place])    HgA1c 17.6 (04/27/2010)  . Erectile dysfunction   . Genital warts   . HIV (human immunodeficiency virus infection) ([place]) 2009   CD4 count 100, VL 13800 (05/01/2010)  . Hypertension   . Osteomyelitis [facility])    h/o hand    Patient Active Problem List   Diagnosis Date Noted  . Onychomycosis of multiple toenails with type 2 diabetes mellitus ([place]) 08/29/2015  . Abscess of finger of right hand 03/08/2015  . Hyperglycemia 05/30/2014  . Neck abscess   . Cellulitis, neck   . MRSA carrier 04/20/2014  . Testosterone deficiency 04/20/2014  . Furuncle of neck 04/19/2014  . Genital warts 02/22/2014  . HIV disease ([place])   . Hyperglycemia due to type 2 diabetes mellitus ([place]) 02/04/2014  . Dental anomaly 11/20/2012  . Arthritis of right knee 02/23/2012  . Hyperlipidemia 11/10/2011  . Chronic pain 08/07/2011  . Meralgia paraesthetica 04/23/2011  . Type 2 diabetes with  circulatory disorder causing erectile dysfunction ([place]) 06/25/2010  . ERECTILE DYSFUNCTION 08/22/2008  . Essential hypertension 05/19/2008    Past Surgical History:  Procedure Laterality Date  . HERNIA REPAIR    . I&D EXTREMITY Left 08/21/2014   Procedure: INCISION AND DRAINAGE LEFT SMALL FINGER;  Surgeon: Leanora Cover, MD;  Location: [facility];  Service: Orthopedics;  Laterality: Left;  . MINOR IRRIGATION AND DEBRIDEMENT OF WOUND Right 04/22/2014   Procedure: IRRIGATION AND DEBRIDEMENT OF RIGHT NECK ABCESS;  Surgeon: Jerrell Belfast, MD;  Location: [facility];  Service: ENT;  Laterality: Right;  . MULTIPLE EXTRACTIONS WITH ALVEOLOPLASTY N/A 01/18/2013   Procedure: MULTIPLE EXTRACION 3, 6, 7, 10, 11, 13, 21, 22, 27, 28, 29, 30 WITH ALVEOLOPLASTY;  Surgeon: Gae Bon, DDS;  Location: [facility];  Service: Oral Surgery;  Laterality: N/A;       Home Medications    Prior to Admission medications   Medication Sig Start Date End Date Taking? Authorizing Provider  abacavir-dolutegravir-lamiVUDine (TRIUMEQ) 600-50-300 MG tablet Take 1 tablet by mouth daily. 06/01/15  Yes [name], MD  aspirin 81 MG EC tablet Take 81 mg by mouth daily.  08/20/11  Yes [name], MD  dapsone 100 MG tablet TAKE 1 TABLET BY MOUTH EVERY DAY. NEED APPOINTMENT 09/19/14  Yes [name], MD  darunavir-cobicistat (PREZCOBIX) 800-150 MG tablet Take 1 tablet by mouth daily. Take  with food. 06/01/15  Yes [name], MD  guaiFENesin-codeine 100-10 MG/5ML syrup Take 10 mLs by mouth every 6 (six) hours as needed for cough. 03/04/16  Yes Kristen N Ward, DO  ibuprofen (ADVIL,MOTRIN) 800 MG tablet Take 1 tablet (800 mg total) by mouth every 8 (eight) hours as needed for mild pain. 03/04/16  Yes Kristen N Ward, DO  imiquimod (ALDARA) 5 % cream Apply topically 3 (three) times a week. 01/30/15  Yes [name], MD  Insulin Glargine (LANTUS SOLOSTAR) 100 UNIT/ML Solostar Pen Inject 60 Units into the skin daily at  10 pm. 06/01/14  Yes [name], MD  insulin lispro (HUMALOG) 100 UNIT/ML KiwkPen Inject 0.08 mLs (8 Units total) into the skin 3 (three) times daily. Patient taking differently: Inject 7 Units into the skin 3 (three) times daily.  02/21/14  Yes [name], MD  LORazepam (ATIVAN) 1 MG tablet Take 1 tablet (1 mg total) by mouth every 6 (six) hours as needed for anxiety. 01/30/15  Yes [name], MD  oxyCODONE-acetaminophen (PERCOCET) 10-325 MG tablet Take 1 tablet by mouth every 6 (six) hours as needed for pain.   Yes Historical Provider, MD  pravastatin (PRAVACHOL) 40 MG tablet Take 1 tablet (40 mg total) by mouth daily. 01/03/14  Yes [name], MD  benzonatate (TESSALON) 100 MG capsule Take 1 capsule (100 mg total) by mouth every 8 (eight) hours. 03/28/16   Xiamara Hulet, PA-C  ondansetron (ZOFRAN) 4 MG tablet Take 1 tablet (4 mg total) by mouth every 8 (eight) hours as needed for nausea or vomiting. 03/28/16   Monico Blitz, PA-C    Family History Family History  Problem Relation Age of Onset  . Hypertension Mother   . Arthritis Father   . Hypertension Father   . Hypertension Brother   . Cancer Maternal Grandmother 55    unknown type of cancer  . Depression Paternal Grandmother     Social History Social History  Substance Use Topics  . Smoking status: Never Smoker  . Smokeless tobacco: Never Used  . Alcohol use No     Allergies   Sulfa antibiotics   Review of Systems Review of Systems  10 systems reviewed and found to be negative, except as noted in the HPI.  Physical Exam Updated Vital Signs BP 144/91   Pulse 87   Temp 97.6 F (36.4 C) (Oral)   Resp 18   Ht 5\' 9"  (1.753 m)   Wt 117.9 kg   SpO2 99%   BMI 38.40 kg/m   Physical Exam  Constitutional: He is oriented to person, place, and time. He appears well-developed and well-nourished. No distress.  HENT:  Head: Normocephalic and atraumatic.  Mouth/Throat: Oropharynx is clear and  moist.  Eyes: Conjunctivae and EOM are normal. Pupils are equal, round, and reactive to light.  Neck: Normal range of motion.  Cardiovascular: Normal rate, regular rhythm and intact distal pulses.  Exam reveals no gallop and no friction rub.   No murmur heard. Pulmonary/Chest: Effort normal and breath sounds normal. No respiratory distress. He has no wheezes. He has no rales. He exhibits no tenderness.  Abdominal: Soft. He exhibits no distension and no mass. There is no tenderness. There is no rebound and no guarding. No hernia.  Genitourinary:  Genitourinary Comments: GU exam a chaperoned by technician: Genital warts with no urethral discharge testicular pain or swelling.  Musculoskeletal: Normal range of motion.  Neurological: He is alert and oriented to person,  place, and time.  Skin: He is not diaphoretic.  Psychiatric: He has a normal mood and affect.  Nursing note and vitals reviewed.    ED Treatments / Results  Labs (all labs ordered are listed, but only abnormal results are displayed) Labs Reviewed  COMPREHENSIVE METABOLIC PANEL - Abnormal; Notable for the following:       Result Value   Glucose, Bld 235 (*)    Albumin 3.4 (*)    ALT 15 (*)    All other components within normal limits  CBC - Abnormal; Notable for the following:    Hemoglobin 12.2 (*)    HCT 36.4 (*)    All other components within normal limits  URINALYSIS, ROUTINE W REFLEX MICROSCOPIC - Abnormal; Notable for the following:    Glucose, UA >=500 (*)    Hgb urine dipstick SMALL (*)    Protein, ur 100 (*)    Bacteria, UA RARE (*)    Squamous Epithelial / LPF 0-5 (*)    All other components within normal limits  CBG MONITORING, ED - Abnormal; Notable for the following:    Glucose-Capillary 230 (*)    All other components within normal limits  GASTROINTESTINAL PANEL BY PCR, STOOL (REPLACES STOOL CULTURE)  OVA + PARASITE EXAM  LIPASE, BLOOD  FECAL LACTOFERRIN, QUANT    EKG  EKG Interpretation None         Radiology Dg Abdomen Acute W/chest  Result Date: 03/29/2016 CLINICAL DATA:  Cough and abdominal pain for 2 days EXAM: DG ABDOMEN ACUTE W/ 1V CHEST COMPARISON:  15400 FINDINGS: There is no evidence of dilated bowel loops or free intraperitoneal air. No radiopaque calculi or other significant radiographic abnormality is seen. Heart size and mediastinal contours are within normal limits. Both lungs are clear. IMPRESSION: Negative abdominal radiographs.  No acute cardiopulmonary disease. Electronically Signed   By: Andreas Newport M.D.   On: 03/29/2016 00:15    Procedures Procedures (including critical care time)  Medications Ordered in ED Medications  benzonatate (TESSALON) capsule 200 mg (200 mg Oral Given 03/29/16 0023)     Initial Impression / Assessment and Plan / ED Course  I have reviewed the triage vital signs and the nursing notes.  Pertinent labs & imaging results that were available during my care of the patient were reviewed by me and considered in my medical decision making (see chart for details).  Clinical Course     Vitals:   03/28/16 2310 03/28/16 2316 03/29/16 0030 03/29/16 0045  BP: 140/100  148/91 144/91  Pulse: 99  85 87  Resp: 18     Temp: 97.6 F (36.4 C)     TempSrc: Oral     SpO2: 100%  99% 99%  Weight:  117.9 kg    Height:  5\' 9"  (1.753 m)      Medications  benzonatate (TESSALON) capsule 200 mg (200 mg Oral Given 03/29/16 0023)    Keith [name] is 50 y.o. male presenting with Multiple complaints including cough, nausea, vomiting, diarrhea, abdominal pain and genital lesions. Lung sounds clear, patient afebrile and overall quite well appearing, abdominal exam is benign. Would like to obtain stool sample for further testing however patient cannot produce this. Acute abdominal series is without infiltrate nonobstructive bowel gas pattern. He is tolerating by mouth's without complication, blood work reassuring. Have advised him to follow closely  with infectious disease for further workup of his diarrhea and also genital warts. Advised him he is contagious.  Final Clinical Impressions(s) / ED Diagnoses   Final diagnoses:  Genital warts  Diarrhea, unspecified type    New Prescriptions New Prescriptions   BENZONATATE (TESSALON) 100 MG CAPSULE    Take 1 capsule (100 mg total) by mouth every 8 (eight) hours.   ONDANSETRON (ZOFRAN) 4 MG TABLET    Take 1 tablet (4 mg total) by mouth every 8 (eight) hours as needed for nausea or vomiting.     Monico Blitz, PA-C 03/29/16 [place], DO 03/29/16 [id number]

## 2016-03-29 ENCOUNTER — Emergency Department (HOSPITAL_COMMUNITY): Payer: Medicare Other

## 2016-03-29 DIAGNOSIS — R109 Unspecified abdominal pain: Secondary | ICD-10-CM | POA: Diagnosis not present

## 2016-03-29 DIAGNOSIS — A630 Anogenital (venereal) warts: Secondary | ICD-10-CM | POA: Diagnosis not present

## 2016-03-29 LAB — COMPREHENSIVE METABOLIC PANEL
ALT: 15 U/L — ABNORMAL LOW (ref 17–63)
AST: 21 U/L (ref 15–41)
Albumin: 3.4 g/dL — ABNORMAL LOW (ref 3.5–5.0)
Alkaline Phosphatase: 58 U/L (ref 38–126)
Anion gap: 11 (ref 5–15)
BUN: 10 mg/dL (ref 6–20)
CO2: 25 mmol/L (ref 22–32)
Calcium: 9.3 mg/dL (ref 8.9–10.3)
Chloride: 101 mmol/L (ref 101–111)
Creatinine, Ser: 1.11 mg/dL (ref 0.61–1.24)
GFR calc Af Amer: >60 mL/min (ref >60)
GFR calc non Af Amer: >60 mL/min (ref >60)
Glucose, Bld: 235 mg/dL — ABNORMAL HIGH (ref 65–99)
Potassium: 3.9 mmol/L (ref 3.5–5.1)
Sodium: 137 mmol/L (ref 135–145)
Total Bilirubin: 0.4 mg/dL (ref 0.3–1.2)
Total Protein: 7.2 g/dL (ref 6.5–8.1)

## 2016-03-29 LAB — LIPASE, BLOOD: Lipase: 18 U/L (ref 11–51)

## 2016-03-29 NOTE — Discharge Instructions (Signed)
It is very important that you follow with your infectious disease doctor at your scheduled appointment   Do not hesitate to return to the Emergency Department for any new, worsening or concerning symptoms.

## 2016-04-09 DIAGNOSIS — E109 Type 1 diabetes mellitus without complications: Secondary | ICD-10-CM | POA: Diagnosis not present

## 2016-04-09 DIAGNOSIS — B20 Human immunodeficiency virus [HIV] disease: Secondary | ICD-10-CM | POA: Diagnosis not present

## 2016-04-09 DIAGNOSIS — G44209 Tension-type headache, unspecified, not intractable: Secondary | ICD-10-CM | POA: Diagnosis not present

## 2016-04-09 DIAGNOSIS — M545 Low back pain: Secondary | ICD-10-CM | POA: Diagnosis not present

## 2016-04-24 ENCOUNTER — Ambulatory Visit: Payer: Medicare Other | Admitting: Infectious Diseases

## 2016-04-26 ENCOUNTER — Encounter (HOSPITAL_COMMUNITY): Payer: Self-pay | Admitting: Emergency Medicine

## 2016-04-26 DIAGNOSIS — E1165 Type 2 diabetes mellitus with hyperglycemia: Secondary | ICD-10-CM | POA: Diagnosis not present

## 2016-04-26 DIAGNOSIS — Z79899 Other long term (current) drug therapy: Secondary | ICD-10-CM | POA: Diagnosis not present

## 2016-04-26 DIAGNOSIS — M25561 Pain in right knee: Secondary | ICD-10-CM | POA: Insufficient documentation

## 2016-04-26 DIAGNOSIS — I10 Essential (primary) hypertension: Secondary | ICD-10-CM | POA: Insufficient documentation

## 2016-04-26 DIAGNOSIS — E119 Type 2 diabetes mellitus without complications: Secondary | ICD-10-CM | POA: Insufficient documentation

## 2016-04-26 DIAGNOSIS — Z7982 Long term (current) use of aspirin: Secondary | ICD-10-CM | POA: Insufficient documentation

## 2016-04-26 DIAGNOSIS — E109 Type 1 diabetes mellitus without complications: Secondary | ICD-10-CM | POA: Diagnosis not present

## 2016-04-26 DIAGNOSIS — B20 Human immunodeficiency virus [HIV] disease: Secondary | ICD-10-CM | POA: Diagnosis not present

## 2016-04-26 DIAGNOSIS — G44209 Tension-type headache, unspecified, not intractable: Secondary | ICD-10-CM | POA: Diagnosis not present

## 2016-04-26 DIAGNOSIS — Z794 Long term (current) use of insulin: Secondary | ICD-10-CM | POA: Diagnosis not present

## 2016-04-26 DIAGNOSIS — M545 Low back pain: Secondary | ICD-10-CM | POA: Diagnosis not present

## 2016-04-26 LAB — CBG MONITORING, ED: Glucose-Capillary: 453 mg/dL — ABNORMAL HIGH (ref 65–99)

## 2016-04-26 NOTE — ED Notes (Signed)
Dr. Claudine Mouton ( EDP ) notified on pt.'s elevated CBG , charge nurse notified .

## 2016-04-26 NOTE — ED Triage Notes (Signed)
Patient reports elevated blood sugar today with bilateral feet tingling , fatigue and right knee pain for 3 weeks , ambulatory /denies injury.

## 2016-04-27 ENCOUNTER — Emergency Department (HOSPITAL_COMMUNITY)
Admission: EM | Admit: 2016-04-27 | Discharge: 2016-04-27 | Disposition: A | Discharge status: Home / Self Care | Payer: Medicare Other | Attending: Emergency Medicine | Admitting: Emergency Medicine

## 2016-04-27 DIAGNOSIS — E1165 Type 2 diabetes mellitus with hyperglycemia: Secondary | ICD-10-CM | POA: Diagnosis not present

## 2016-04-27 DIAGNOSIS — R739 Hyperglycemia, unspecified: Secondary | ICD-10-CM

## 2016-04-27 DIAGNOSIS — M25561 Pain in right knee: Secondary | ICD-10-CM

## 2016-04-27 LAB — CBC WITH DIFFERENTIAL/PLATELET
Basophils Absolute: 0 10*3/uL (ref 0.0–0.1)
Basophils Relative: 0 %
Eosinophils Absolute: 0.1 10*3/uL (ref 0.0–0.7)
Eosinophils Relative: 2 %
HCT: 38 % — ABNORMAL LOW (ref 39.0–52.0)
Hemoglobin: 12.7 g/dL — ABNORMAL LOW (ref 13.0–17.0)
Lymphocytes Relative: 37 %
Lymphs Abs: 2.4 10*3/uL (ref 0.7–4.0)
MCH: 27 pg (ref 26.0–34.0)
MCHC: 33.4 g/dL (ref 30.0–36.0)
MCV: 80.9 fL (ref 78.0–100.0)
Monocytes Absolute: 0.3 10*3/uL (ref 0.1–1.0)
Monocytes Relative: 4 %
Neutro Abs: 3.6 10*3/uL (ref 1.7–7.7)
Neutrophils Relative %: 57 %
Platelets: 244 10*3/uL (ref 150–400)
RBC: 4.7 MIL/uL (ref 4.22–5.81)
RDW: 13.3 % (ref 11.5–15.5)
WBC: 6.4 10*3/uL (ref 4.0–10.5)

## 2016-04-27 LAB — COMPREHENSIVE METABOLIC PANEL
ALT: 13 U/L — ABNORMAL LOW (ref 17–63)
AST: 18 U/L (ref 15–41)
Albumin: 3.8 g/dL (ref 3.5–5.0)
Alkaline Phosphatase: 66 U/L (ref 38–126)
Anion gap: 11 (ref 5–15)
BUN: 14 mg/dL (ref 6–20)
CO2: 25 mmol/L (ref 22–32)
Calcium: 9.8 mg/dL (ref 8.9–10.3)
Chloride: 98 mmol/L — ABNORMAL LOW (ref 101–111)
Creatinine, Ser: 1.08 mg/dL (ref 0.61–1.24)
GFR calc Af Amer: >60 mL/min (ref >60)
GFR calc non Af Amer: >60 mL/min (ref >60)
Glucose, Bld: 434 mg/dL — ABNORMAL HIGH (ref 65–99)
Potassium: 3.6 mmol/L (ref 3.5–5.1)
Sodium: 134 mmol/L — ABNORMAL LOW (ref 135–145)
Total Bilirubin: 0.4 mg/dL (ref 0.3–1.2)
Total Protein: 7.4 g/dL (ref 6.5–8.1)

## 2016-04-27 LAB — URINALYSIS, ROUTINE W REFLEX MICROSCOPIC
Bacteria, UA: NONE SEEN
Bilirubin Urine: NEGATIVE
Glucose, UA: >=500 mg/dL — AB
Ketones, ur: NEGATIVE mg/dL
Leukocytes, UA: NEGATIVE
Nitrite: NEGATIVE
Protein, ur: NEGATIVE mg/dL
Specific Gravity, Urine: 1.027 (ref 1.005–1.030)
pH: 5 (ref 5.0–8.0)

## 2016-04-27 LAB — CBG MONITORING, ED: Glucose-Capillary: 360 mg/dL — ABNORMAL HIGH (ref 65–99)

## 2016-04-27 MED ORDER — HYDROCODONE-ACETAMINOPHEN 5-325 MG PO TABS: 1.0000 | ORAL_TABLET | Freq: Four times a day (QID) | ORAL | 0 refills | Status: DC | PRN

## 2016-04-27 NOTE — ED Provider Notes (Signed)
[facility] DEPT Provider Note   CSN: [id number] Arrival date & time: 04/26/16  2336     History   Chief Complaint Chief Complaint  Patient presents with  . Hyperglycemia    Knee Pain     HPI Keith [name] is a 51 y.o. male.  Patient is a 51 year old male with history of HIV disease, diabetes. He presents today for evaluation of right knee pain and elevated blood sugar. He tells me he ran out of his insulin yesterday. He denies any new injury or trauma. He denies any fevers or chills.      Past Medical History:  Diagnosis Date  . AIDS ([place])   . Chronic knee pain    right  . Chronic pain   . Diabetes mellitus   . Diabetes type 2, uncontrolled ([place])    HgA1c 17.6 (04/27/2010)  . Erectile dysfunction   . Genital warts   . HIV (human immunodeficiency virus infection) ([place]) 2009   CD4 count 100, VL 13800 (05/01/2010)  . Hypertension   . Osteomyelitis [facility])    h/o hand    Patient Active Problem List   Diagnosis Date Noted  . Onychomycosis of multiple toenails with type 2 diabetes mellitus ([place]) 08/29/2015  . Abscess of finger of right hand 03/08/2015  . Hyperglycemia 05/30/2014  . Neck abscess   . Cellulitis, neck   . MRSA carrier 04/20/2014  . Testosterone deficiency 04/20/2014  . Furuncle of neck 04/19/2014  . Genital warts 02/22/2014  . HIV disease ([place])   . Hyperglycemia due to type 2 diabetes mellitus ([place]) 02/04/2014  . Dental anomaly 11/20/2012  . Arthritis of right knee 02/23/2012  . Hyperlipidemia 11/10/2011  . Chronic pain 08/07/2011  . Meralgia paraesthetica 04/23/2011  . Type 2 diabetes with circulatory disorder causing erectile dysfunction ([place]) 06/25/2010  . ERECTILE DYSFUNCTION 08/22/2008  . Essential hypertension 05/19/2008    Past Surgical History:  Procedure Laterality Date  . HERNIA REPAIR    . I&D EXTREMITY Left 08/21/2014   Procedure: INCISION AND DRAINAGE LEFT SMALL FINGER;  Surgeon: Leanora Cover, MD;  Location: [place];  Service:  Orthopedics;  Laterality: Left;  . MINOR IRRIGATION AND DEBRIDEMENT OF WOUND Right 04/22/2014   Procedure: IRRIGATION AND DEBRIDEMENT OF RIGHT NECK ABCESS;  Surgeon: Jerrell Belfast, MD;  Location: [place];  Service: ENT;  Laterality: Right;  . MULTIPLE EXTRACTIONS WITH ALVEOLOPLASTY N/A 01/18/2013   Procedure: MULTIPLE EXTRACION 3, 6, 7, 10, 11, 13, 21, 22, 27, 28, 29, 30 WITH ALVEOLOPLASTY;  Surgeon: Gae Bon, DDS;  Location: [place];  Service: Oral Surgery;  Laterality: N/A;       Home Medications    Prior to Admission medications   Medication Sig Start Date End Date Taking? Authorizing Provider  abacavir-dolutegravir-lamiVUDine (TRIUMEQ) 600-50-300 MG tablet Take 1 tablet by mouth daily. 06/01/15  Yes [name], MD  aspirin 81 MG EC tablet Take 81 mg by mouth daily.  08/20/11  Yes [name], MD  dapsone 100 MG tablet TAKE 1 TABLET BY MOUTH EVERY DAY. NEED APPOINTMENT 09/19/14  Yes [name], MD  darunavir-cobicistat (PREZCOBIX) 800-150 MG tablet Take 1 tablet by mouth daily. Take with food. 06/01/15  Yes [name], MD  guaiFENesin-codeine 100-10 MG/5ML syrup Take 10 mLs by mouth every 6 (six) hours as needed for cough. 03/04/16  Yes Kristen N Ward, DO  ibuprofen (ADVIL,MOTRIN) 800 MG tablet Take 1 tablet (800 mg total) by mouth every 8 (eight) hours as  needed for mild pain. 03/04/16  Yes Kristen N Ward, DO  imiquimod (ALDARA) 5 % cream Apply topically 3 (three) times a week. 01/30/15  Yes [name], MD  Insulin Glargine (LANTUS SOLOSTAR) 100 UNIT/ML Solostar Pen Inject 60 Units into the skin daily at 10 pm. 06/01/14  Yes [name], MD  insulin lispro (HUMALOG) 100 UNIT/ML KiwkPen Inject 0.08 mLs (8 Units total) into the skin 3 (three) times daily. Patient taking differently: Inject 7 Units into the skin 3 (three) times daily.  02/21/14  Yes [name], MD  LORazepam (ATIVAN) 1 MG tablet Take 1 tablet (1 mg total) by mouth every 6 (six) hours as  needed for anxiety. 01/30/15  Yes [name], MD  ondansetron (ZOFRAN) 4 MG tablet Take 1 tablet (4 mg total) by mouth every 8 (eight) hours as needed for nausea or vomiting. 03/28/16  Yes Nicole Pisciotta, PA-C  oxyCODONE-acetaminophen (PERCOCET) 10-325 MG tablet Take 1 tablet by mouth every 6 (six) hours as needed for pain.   Yes Historical Provider, MD  pravastatin (PRAVACHOL) 40 MG tablet Take 1 tablet (40 mg total) by mouth daily. 01/03/14  Yes [name], MD    Family History Family History  Problem Relation Age of Onset  . Hypertension Mother   . Arthritis Father   . Hypertension Father   . Hypertension Brother   . Cancer Maternal Grandmother 64    unknown type of cancer  . Depression Paternal Grandmother     Social History Social History  Substance Use Topics  . Smoking status: Never Smoker  . Smokeless tobacco: Never Used  . Alcohol use No     Allergies   Sulfa antibiotics   Review of Systems Review of Systems  All other systems reviewed and are negative.    Physical Exam Updated Vital Signs BP 158/100   Pulse 69   Temp 98 F (36.7 C) (Oral)   Resp 14   Ht 5\' 10"  (1.778 m)   Wt 250 lb (113.4 kg)   SpO2 99%   BMI 35.87 kg/m   Physical Exam  Constitutional: He is oriented to person, place, and time. He appears well-developed and well-nourished. No distress.  HENT:  Head: Normocephalic and atraumatic.  Mouth/Throat: Oropharynx is clear and moist.  Neck: Normal range of motion. Neck supple.  Cardiovascular: Normal rate and regular rhythm.  Exam reveals no friction rub.   No murmur heard. Pulmonary/Chest: Effort normal and breath sounds normal. No respiratory distress. He has no wheezes. He has no rales.  Abdominal: Soft. Bowel sounds are normal. He exhibits no distension. There is no tenderness.  Musculoskeletal: Normal range of motion. He exhibits no edema.  The right knee appears grossly normal. There is no redness, warmth, or effusion. He  has good range of motion, however with some discomfort.  Neurological: He is alert and oriented to person, place, and time. Coordination normal.  Skin: Skin is warm and dry. He is not diaphoretic.  Nursing note and vitals reviewed.    ED Treatments / Results  Labs (all labs ordered are listed, but only abnormal results are displayed) Labs Reviewed  CBC WITH DIFFERENTIAL/PLATELET - Abnormal; Notable for the following:       Result Value   Hemoglobin 12.7 (*)    HCT 38.0 (*)    All other components within normal limits  COMPREHENSIVE METABOLIC PANEL - Abnormal; Notable for the following:    Sodium 134 (*)    Chloride 98 (*)  Glucose, Bld 434 (*)    ALT 13 (*)    All other components within normal limits  URINALYSIS, ROUTINE W REFLEX MICROSCOPIC - Abnormal; Notable for the following:    Color, Urine STRAW (*)    Glucose, UA >=500 (*)    Hgb urine dipstick SMALL (*)    Squamous Epithelial / LPF 0-5 (*)    All other components within normal limits  CBG MONITORING, ED - Abnormal; Notable for the following:    Glucose-Capillary 453 (*)    All other components within normal limits  CBG MONITORING, ED - Abnormal; Notable for the following:    Glucose-Capillary 360 (*)    All other components within normal limits    EKG  EKG Interpretation None       Radiology No results found.  Procedures Procedures (including critical care time)  Medications Ordered in ED Medications - No data to display   Initial Impression / Assessment and Plan / ED Course  I have reviewed the triage vital signs and the nursing notes.  Pertinent labs & imaging results that were available during my care of the patient were reviewed by me and considered in my medical decision making (see chart for details).     Patient with history of chronic knee pain and elevated blood sugar. His blood sugar is high, however he ran out of his insulin yesterday and has been unable to get it filled because  "Medicare won't pay for it". I see nothing here that appears acute. He will be given a prescription for Vicodin and advised to fill his insulin.  Final Clinical Impressions(s) / ED Diagnoses   Final diagnoses:  None    New Prescriptions New Prescriptions   No medications on file     Veryl Speak, MD 04/27/16 [phone]

## 2016-04-27 NOTE — Discharge Instructions (Signed)
Fill your prescription for insulin and continue this as prescribed.  Hydrocodone as prescribed as needed for pain.  Please follow up with your primary doctor for additional medication refills.

## 2016-04-27 NOTE — ED Notes (Signed)
Patient up to desk asking about wait time.  Updated and informed

## 2016-04-29 ENCOUNTER — Telehealth: Payer: Self-pay | Admitting: Internal Medicine

## 2016-04-29 NOTE — Telephone Encounter (Signed)
Patient called since he would like to have a patient appt to be seen for skin lesions. He was seen in the ED

## 2016-04-30 NOTE — Telephone Encounter (Signed)
Attempted to call patient to schedule an appointment with Dr [name].  No answer, no voicemail. Landis Gandy, RN

## 2016-05-06 DIAGNOSIS — M545 Low back pain: Secondary | ICD-10-CM | POA: Diagnosis not present

## 2016-05-06 DIAGNOSIS — G44209 Tension-type headache, unspecified, not intractable: Secondary | ICD-10-CM | POA: Diagnosis not present

## 2016-05-06 DIAGNOSIS — E109 Type 1 diabetes mellitus without complications: Secondary | ICD-10-CM | POA: Diagnosis not present

## 2016-05-06 DIAGNOSIS — B20 Human immunodeficiency virus [HIV] disease: Secondary | ICD-10-CM | POA: Diagnosis not present

## 2016-05-13 ENCOUNTER — Encounter: Payer: Self-pay | Admitting: Infectious Diseases

## 2016-05-13 ENCOUNTER — Ambulatory Visit (INDEPENDENT_AMBULATORY_CARE_PROVIDER_SITE_OTHER): Payer: Medicare Other | Admitting: Infectious Diseases

## 2016-05-13 VITALS — BP 105/74 | HR 94 | Temp 98.1°F | Ht 69.0 in | Wt 254.0 lb

## 2016-05-13 DIAGNOSIS — G8929 Other chronic pain: Secondary | ICD-10-CM | POA: Diagnosis not present

## 2016-05-13 DIAGNOSIS — B20 Human immunodeficiency virus [HIV] disease: Secondary | ICD-10-CM

## 2016-05-13 DIAGNOSIS — Z113 Encounter for screening for infections with a predominantly sexual mode of transmission: Secondary | ICD-10-CM | POA: Diagnosis not present

## 2016-05-13 DIAGNOSIS — Z79899 Other long term (current) drug therapy: Secondary | ICD-10-CM

## 2016-05-13 DIAGNOSIS — Z23 Encounter for immunization: Secondary | ICD-10-CM

## 2016-05-13 DIAGNOSIS — Z794 Long term (current) use of insulin: Secondary | ICD-10-CM

## 2016-05-13 DIAGNOSIS — E1165 Type 2 diabetes mellitus with hyperglycemia: Secondary | ICD-10-CM

## 2016-05-13 DIAGNOSIS — A630 Anogenital (venereal) warts: Secondary | ICD-10-CM | POA: Diagnosis not present

## 2016-05-13 LAB — LIPID PANEL
Cholesterol: 289 mg/dL — ABNORMAL HIGH (ref <200)
HDL: 36 mg/dL — ABNORMAL LOW (ref >40)
LDL Cholesterol: 210 mg/dL — ABNORMAL HIGH (ref <100)
Total CHOL/HDL Ratio: 8 Ratio — ABNORMAL HIGH (ref <5.0)
Triglycerides: 217 mg/dL — ABNORMAL HIGH (ref <150)
VLDL: 43 mg/dL — ABNORMAL HIGH (ref <30)

## 2016-05-13 LAB — COMPREHENSIVE METABOLIC PANEL
ALT: 8 U/L — ABNORMAL LOW (ref 9–46)
AST: 12 U/L (ref 10–35)
Albumin: 4.1 g/dL (ref 3.6–5.1)
Alkaline Phosphatase: 62 U/L (ref 40–115)
BUN: 12 mg/dL (ref 7–25)
CO2: 29 mmol/L (ref 20–31)
Calcium: 9.5 mg/dL (ref 8.6–10.3)
Chloride: 94 mmol/L — ABNORMAL LOW (ref 98–110)
Creat: 1.18 mg/dL (ref 0.70–1.33)
Glucose, Bld: 592 mg/dL (ref 65–99)
Potassium: 4.2 mmol/L (ref 3.5–5.3)
Sodium: 131 mmol/L — ABNORMAL LOW (ref 135–146)
Total Bilirubin: 0.5 mg/dL (ref 0.2–1.2)
Total Protein: 7.4 g/dL (ref 6.1–8.1)

## 2016-05-13 LAB — CBC
HCT: 41.6 % (ref 38.5–50.0)
Hemoglobin: 13.3 g/dL (ref 13.2–17.1)
MCH: 27 pg (ref 27.0–33.0)
MCHC: 32 g/dL (ref 32.0–36.0)
MCV: 84.6 fL (ref 80.0–100.0)
MPV: 10 fL (ref 7.5–12.5)
Platelets: 231 10*3/uL (ref 140–400)
RBC: 4.92 MIL/uL (ref 4.20–5.80)
RDW: 14.3 % (ref 11.0–15.0)
WBC: 5.9 10*3/uL (ref 3.8–10.8)

## 2016-05-13 NOTE — Assessment & Plan Note (Signed)
Will have him seen by urology.

## 2016-05-13 NOTE — Assessment & Plan Note (Signed)
He is poorly controlled.  Will get him in with new PCP.

## 2016-05-13 NOTE — Assessment & Plan Note (Signed)
Will continue on his current art.  He needs mening and flu vax. He has gotten 2 partial Hep B series with no response.  Offered/refused condoms.  rtc in 4 months.

## 2016-05-13 NOTE — Assessment & Plan Note (Signed)
Will f/u with PCP. No pain rx were asked for or written today.

## 2016-05-13 NOTE — Progress Notes (Signed)
   Subjective:    [name], male    DOB: 01-19-1966, 51 y.o.   MRN: [id number]  HPI 51 yo M with hx of HIV+ (1992) on DRVr/ISNTRV then changed to DRVc/Triumeq, and uncontrolled DM2.  He has been to ED 9 times since last visit in May.  Would like to have his penile warts removed.  No problem with ART.  DM has been "going fine". Last fsg was 240s. Wants new PCP. States his current PCP is not giving him enough (back) pain rx.   HIV 1 RNA Quant (copies/mL)  Date Value  08/29/2015 <20  01/06/2015 <20  07/06/2014 5,368 (H)   CD4 T Cell Abs (/uL)  Date Value  08/29/2015 220 (L)  01/06/2015 140 (L)  07/06/2014 120 (L)    Review of Systems  Constitutional: Negative for appetite change, chills, fever and unexpected weight change.  Respiratory: Negative for cough and shortness of breath.   Cardiovascular: Negative for chest pain and leg swelling.  Gastrointestinal: Negative for constipation and diarrhea.  Genitourinary: Negative for difficulty urinating.  Neurological: Negative for headaches.       Objective:   Physical Exam  Constitutional: He appears well-developed and well-nourished.  HENT:  Mouth/Throat: No oropharyngeal exudate.  Eyes: EOM are normal. Pupils are equal, round, and reactive to light.  Neck: Neck supple.  Cardiovascular: Normal rate, regular rhythm and normal heart sounds.   Pulmonary/Chest: Effort normal and breath sounds normal.  Abdominal: Soft. Bowel sounds are normal. There is no tenderness. There is no rebound.  Lymphadenopathy:    He has no cervical adenopathy.      Assessment & Plan:

## 2016-05-13 NOTE — Addendum Note (Signed)
Addended by: Landis Gandy on: 05/13/2016 05:47 PM   Modules accepted: Orders

## 2016-05-14 ENCOUNTER — Telehealth: Payer: Self-pay | Admitting: *Deleted

## 2016-05-14 DIAGNOSIS — B20 Human immunodeficiency virus [HIV] disease: Secondary | ICD-10-CM | POA: Diagnosis not present

## 2016-05-14 DIAGNOSIS — G44209 Tension-type headache, unspecified, not intractable: Secondary | ICD-10-CM | POA: Diagnosis not present

## 2016-05-14 DIAGNOSIS — M545 Low back pain: Secondary | ICD-10-CM | POA: Diagnosis not present

## 2016-05-14 DIAGNOSIS — E109 Type 1 diabetes mellitus without complications: Secondary | ICD-10-CM | POA: Diagnosis not present

## 2016-05-14 LAB — RPR

## 2016-05-14 NOTE — Telephone Encounter (Signed)
So much! I appreciate your diligence

## 2016-05-14 NOTE — Telephone Encounter (Signed)
Received call regarding critical gluose of 592, repeated and verified, drawn 2/5.  RN called patient, relayed result, asked if he was taking his insulin as prescribed. Patient stated he hadn't checked his sugars "in a while" but will do so this morning and will call his primary care doctor for an appointment/advice. Landis Gandy, RN

## 2016-05-15 LAB — HIV-1 RNA QUANT-NO REFLEX-BLD
HIV 1 RNA Quant: <20 copies/mL — AB
HIV-1 RNA Quant, Log: <1.3 Log copies/mL — AB

## 2016-05-15 LAB — T-HELPER CELL (CD4) - (RCID CLINIC ONLY)
CD4 % Helper T Cell: 10 % — ABNORMAL LOW (ref 33–55)
CD4 T Cell Abs: 210 /uL — ABNORMAL LOW (ref 400–2700)

## 2016-05-27 DIAGNOSIS — E109 Type 1 diabetes mellitus without complications: Secondary | ICD-10-CM | POA: Diagnosis not present

## 2016-05-27 DIAGNOSIS — G44209 Tension-type headache, unspecified, not intractable: Secondary | ICD-10-CM | POA: Diagnosis not present

## 2016-05-27 DIAGNOSIS — M545 Low back pain: Secondary | ICD-10-CM | POA: Diagnosis not present

## 2016-05-27 DIAGNOSIS — B20 Human immunodeficiency virus [HIV] disease: Secondary | ICD-10-CM | POA: Diagnosis not present

## 2016-06-03 ENCOUNTER — Encounter (HOSPITAL_COMMUNITY): Payer: Self-pay

## 2016-06-03 DIAGNOSIS — E1165 Type 2 diabetes mellitus with hyperglycemia: Secondary | ICD-10-CM | POA: Diagnosis present

## 2016-06-03 DIAGNOSIS — G8929 Other chronic pain: Secondary | ICD-10-CM | POA: Diagnosis not present

## 2016-06-03 DIAGNOSIS — G44209 Tension-type headache, unspecified, not intractable: Secondary | ICD-10-CM | POA: Diagnosis not present

## 2016-06-03 DIAGNOSIS — L02511 Cutaneous abscess of right hand: Secondary | ICD-10-CM | POA: Diagnosis not present

## 2016-06-03 DIAGNOSIS — Z794 Long term (current) use of insulin: Secondary | ICD-10-CM | POA: Diagnosis not present

## 2016-06-03 DIAGNOSIS — L03011 Cellulitis of right finger: Secondary | ICD-10-CM | POA: Diagnosis not present

## 2016-06-03 DIAGNOSIS — N529 Male erectile dysfunction, unspecified: Secondary | ICD-10-CM | POA: Diagnosis present

## 2016-06-03 DIAGNOSIS — Z882 Allergy status to sulfonamides status: Secondary | ICD-10-CM | POA: Diagnosis not present

## 2016-06-03 DIAGNOSIS — M7989 Other specified soft tissue disorders: Secondary | ICD-10-CM | POA: Diagnosis not present

## 2016-06-03 DIAGNOSIS — Z79899 Other long term (current) drug therapy: Secondary | ICD-10-CM | POA: Diagnosis not present

## 2016-06-03 DIAGNOSIS — I10 Essential (primary) hypertension: Secondary | ICD-10-CM | POA: Diagnosis present

## 2016-06-03 DIAGNOSIS — Z79891 Long term (current) use of opiate analgesic: Secondary | ICD-10-CM | POA: Diagnosis not present

## 2016-06-03 DIAGNOSIS — B20 Human immunodeficiency virus [HIV] disease: Secondary | ICD-10-CM | POA: Diagnosis not present

## 2016-06-03 DIAGNOSIS — L02413 Cutaneous abscess of right upper limb: Secondary | ICD-10-CM | POA: Diagnosis not present

## 2016-06-03 DIAGNOSIS — M545 Low back pain: Secondary | ICD-10-CM | POA: Diagnosis not present

## 2016-06-03 DIAGNOSIS — E109 Type 1 diabetes mellitus without complications: Secondary | ICD-10-CM | POA: Diagnosis not present

## 2016-06-03 NOTE — ED Triage Notes (Signed)
Pt having right hand and knee pain since Saturday. Right pinkie is severely swollen and warm to the touch. Swelling extends into hand. Pt denies injury or wounds.

## 2016-06-04 ENCOUNTER — Emergency Department (HOSPITAL_COMMUNITY): Payer: Medicare Other

## 2016-06-04 ENCOUNTER — Inpatient Hospital Stay (HOSPITAL_COMMUNITY)
Admission: EM | Admit: 2016-06-04 | Discharge: 2016-06-05 | DRG: 579 | Disposition: A | Discharge status: Home / Self Care | Payer: Medicare Other | Attending: Internal Medicine | Admitting: Internal Medicine

## 2016-06-04 ENCOUNTER — Encounter (HOSPITAL_COMMUNITY): Payer: Self-pay | Admitting: Internal Medicine

## 2016-06-04 DIAGNOSIS — L02519 Cutaneous abscess of unspecified hand: Secondary | ICD-10-CM | POA: Diagnosis present

## 2016-06-04 DIAGNOSIS — L03114 Cellulitis of left upper limb: Secondary | ICD-10-CM | POA: Diagnosis not present

## 2016-06-04 DIAGNOSIS — L03119 Cellulitis of unspecified part of limb: Secondary | ICD-10-CM | POA: Diagnosis present

## 2016-06-04 DIAGNOSIS — B20 Human immunodeficiency virus [HIV] disease: Secondary | ICD-10-CM | POA: Diagnosis present

## 2016-06-04 DIAGNOSIS — M7989 Other specified soft tissue disorders: Secondary | ICD-10-CM | POA: Diagnosis not present

## 2016-06-04 DIAGNOSIS — I10 Essential (primary) hypertension: Secondary | ICD-10-CM | POA: Diagnosis present

## 2016-06-04 DIAGNOSIS — N529 Male erectile dysfunction, unspecified: Secondary | ICD-10-CM | POA: Diagnosis present

## 2016-06-04 DIAGNOSIS — Z79899 Other long term (current) drug therapy: Secondary | ICD-10-CM | POA: Diagnosis not present

## 2016-06-04 DIAGNOSIS — E119 Type 2 diabetes mellitus without complications: Secondary | ICD-10-CM | POA: Diagnosis present

## 2016-06-04 DIAGNOSIS — L03011 Cellulitis of right finger: Secondary | ICD-10-CM | POA: Diagnosis not present

## 2016-06-04 DIAGNOSIS — E1165 Type 2 diabetes mellitus with hyperglycemia: Secondary | ICD-10-CM | POA: Diagnosis not present

## 2016-06-04 DIAGNOSIS — Z794 Long term (current) use of insulin: Secondary | ICD-10-CM | POA: Diagnosis not present

## 2016-06-04 DIAGNOSIS — Z79891 Long term (current) use of opiate analgesic: Secondary | ICD-10-CM | POA: Diagnosis not present

## 2016-06-04 DIAGNOSIS — L02511 Cutaneous abscess of right hand: Secondary | ICD-10-CM | POA: Diagnosis present

## 2016-06-04 DIAGNOSIS — Z882 Allergy status to sulfonamides status: Secondary | ICD-10-CM | POA: Diagnosis not present

## 2016-06-04 DIAGNOSIS — G8929 Other chronic pain: Secondary | ICD-10-CM | POA: Diagnosis present

## 2016-06-04 LAB — CBC WITH DIFFERENTIAL/PLATELET
Basophils Absolute: 0 10*3/uL (ref 0.0–0.1)
Basophils Absolute: 0 10*3/uL (ref 0.0–0.1)
Basophils Relative: 0 %
Basophils Relative: 0 %
Eosinophils Absolute: 0.1 10*3/uL (ref 0.0–0.7)
Eosinophils Absolute: 0.1 10*3/uL (ref 0.0–0.7)
Eosinophils Relative: 1 %
Eosinophils Relative: 1 %
HCT: 37.9 % — ABNORMAL LOW (ref 39.0–52.0)
HCT: 37.6 % — ABNORMAL LOW (ref 39.0–52.0)
Hemoglobin: 12.8 g/dL — ABNORMAL LOW (ref 13.0–17.0)
Hemoglobin: 12.5 g/dL — ABNORMAL LOW (ref 13.0–17.0)
Lymphocytes Relative: 18 %
Lymphocytes Relative: 23 %
Lymphs Abs: 2.2 10*3/uL (ref 0.7–4.0)
Lymphs Abs: 2.7 10*3/uL (ref 0.7–4.0)
MCH: 27.3 pg (ref 26.0–34.0)
MCH: 27.1 pg (ref 26.0–34.0)
MCHC: 33.8 g/dL (ref 30.0–36.0)
MCHC: 33.2 g/dL (ref 30.0–36.0)
MCV: 80.8 fL (ref 78.0–100.0)
MCV: 81.4 fL (ref 78.0–100.0)
Monocytes Absolute: 0.8 10*3/uL (ref 0.1–1.0)
Monocytes Absolute: 0.8 10*3/uL (ref 0.1–1.0)
Monocytes Relative: 6 %
Monocytes Relative: 7 %
Neutro Abs: 9.5 10*3/uL — ABNORMAL HIGH (ref 1.7–7.7)
Neutro Abs: 8.3 10*3/uL — ABNORMAL HIGH (ref 1.7–7.7)
Neutrophils Relative %: 75 %
Neutrophils Relative %: 69 %
Platelets: 219 10*3/uL (ref 150–400)
Platelets: 215 10*3/uL (ref 150–400)
RBC: 4.69 MIL/uL (ref 4.22–5.81)
RBC: 4.62 MIL/uL (ref 4.22–5.81)
RDW: 12.9 % (ref 11.5–15.5)
RDW: 13 % (ref 11.5–15.5)
WBC: 12.6 10*3/uL — ABNORMAL HIGH (ref 4.0–10.5)
WBC: 11.9 10*3/uL — ABNORMAL HIGH (ref 4.0–10.5)

## 2016-06-04 LAB — GLUCOSE, CAPILLARY
Glucose-Capillary: 399 mg/dL — ABNORMAL HIGH (ref 65–99)
Glucose-Capillary: 239 mg/dL — ABNORMAL HIGH (ref 65–99)
Glucose-Capillary: 317 mg/dL — ABNORMAL HIGH (ref 65–99)
Glucose-Capillary: 265 mg/dL — ABNORMAL HIGH (ref 65–99)

## 2016-06-04 LAB — BASIC METABOLIC PANEL
Anion gap: 14 (ref 5–15)
Anion gap: 13 (ref 5–15)
BUN: 18 mg/dL (ref 6–20)
BUN: 18 mg/dL (ref 6–20)
CO2: 22 mmol/L (ref 22–32)
CO2: 22 mmol/L (ref 22–32)
Calcium: 9.3 mg/dL (ref 8.9–10.3)
Calcium: 9.4 mg/dL (ref 8.9–10.3)
Chloride: 94 mmol/L — ABNORMAL LOW (ref 101–111)
Chloride: 95 mmol/L — ABNORMAL LOW (ref 101–111)
Creatinine, Ser: 1.19 mg/dL (ref 0.61–1.24)
Creatinine, Ser: 1.21 mg/dL (ref 0.61–1.24)
GFR calc Af Amer: >60 mL/min (ref >60)
GFR calc Af Amer: >60 mL/min (ref >60)
GFR calc non Af Amer: >60 mL/min (ref >60)
GFR calc non Af Amer: >60 mL/min (ref >60)
Glucose, Bld: 446 mg/dL — ABNORMAL HIGH (ref 65–99)
Glucose, Bld: 475 mg/dL — ABNORMAL HIGH (ref 65–99)
Potassium: 4.1 mmol/L (ref 3.5–5.1)
Potassium: 4.4 mmol/L (ref 3.5–5.1)
Sodium: 130 mmol/L — ABNORMAL LOW (ref 135–145)
Sodium: 130 mmol/L — ABNORMAL LOW (ref 135–145)

## 2016-06-04 LAB — MRSA PCR SCREENING: MRSA by PCR: POSITIVE — AB

## 2016-06-04 LAB — CBG MONITORING, ED: Glucose-Capillary: 477 mg/dL — ABNORMAL HIGH (ref 65–99)

## 2016-06-04 MED ORDER — OXYCODONE HCL 5 MG PO TABS
10.0000 mg | ORAL_TABLET | Freq: Three times a day (TID) | ORAL | Status: DC
  Administered 2016-06-04 – 2016-06-05 (×4): 10 mg via ORAL
  Filled 2016-06-04 (×4): qty 2

## 2016-06-04 MED ORDER — SODIUM CHLORIDE 0.9 % IV SOLN
INTRAVENOUS | Status: AC
  Administered 2016-06-04 (×2): via INTRAVENOUS

## 2016-06-04 MED ORDER — INSULIN ASPART 100 UNIT/ML ~~LOC~~ SOLN
10.0000 [IU] | Freq: Once | SUBCUTANEOUS | Status: AC
  Administered 2016-06-04: 10 [IU] via SUBCUTANEOUS
  Filled 2016-06-04: qty 1

## 2016-06-04 MED ORDER — CHLORHEXIDINE GLUCONATE CLOTH 2 % EX PADS
6.0000 | MEDICATED_PAD | Freq: Every day | CUTANEOUS | Status: DC
Start: 2016-06-04 — End: 2016-06-05
  Administered 2016-06-04 – 2016-06-05 (×2): 6 via TOPICAL

## 2016-06-04 MED ORDER — VANCOMYCIN HCL 10 G IV SOLR
2000.0000 mg | INTRAVENOUS | Status: AC
  Administered 2016-06-04: 2000 mg via INTRAVENOUS
  Filled 2016-06-04: qty 2000

## 2016-06-04 MED ORDER — INSULIN ASPART 100 UNIT/ML ~~LOC~~ SOLN
0.0000 [IU] | Freq: Three times a day (TID) | SUBCUTANEOUS | Status: DC
  Administered 2016-06-04: 15 [IU] via SUBCUTANEOUS
  Administered 2016-06-04: 11 [IU] via SUBCUTANEOUS
  Administered 2016-06-04 – 2016-06-05 (×2): 5 [IU] via SUBCUTANEOUS

## 2016-06-04 MED ORDER — MUPIROCIN 2 % EX OINT
1.0000 "application " | TOPICAL_OINTMENT | Freq: Two times a day (BID) | CUTANEOUS | Status: DC
  Administered 2016-06-04 – 2016-06-05 (×3): 1 via NASAL
  Filled 2016-06-04: qty 22

## 2016-06-04 MED ORDER — LIDOCAINE-EPINEPHRINE (PF) 2 %-1:200000 IJ SOLN
10.0000 mL | Freq: Once | INTRAMUSCULAR | Status: AC
  Administered 2016-06-04: 10 mL
  Filled 2016-06-04: qty 20

## 2016-06-04 MED ORDER — CLINDAMYCIN PHOSPHATE 600 MG/50ML IV SOLN
600.0000 mg | Freq: Once | INTRAVENOUS | Status: AC
  Administered 2016-06-04: 600 mg via INTRAVENOUS
  Filled 2016-06-04: qty 50

## 2016-06-04 MED ORDER — ACETAMINOPHEN 650 MG RE SUPP: 650.0000 mg | Freq: Four times a day (QID) | RECTAL | Status: DC | PRN

## 2016-06-04 MED ORDER — OXYCODONE HCL 5 MG PO TABS
5.0000 mg | ORAL_TABLET | Freq: Once | ORAL | Status: AC
  Administered 2016-06-04: 5 mg via ORAL
  Filled 2016-06-04: qty 1

## 2016-06-04 MED ORDER — VANCOMYCIN HCL IN DEXTROSE 1-5 GM/200ML-% IV SOLN
1000.0000 mg | Freq: Two times a day (BID) | INTRAVENOUS | Status: DC
  Administered 2016-06-04 – 2016-06-05 (×2): 1000 mg via INTRAVENOUS
  Filled 2016-06-04 (×2): qty 200

## 2016-06-04 MED ORDER — ONDANSETRON HCL 4 MG/2ML IJ SOLN: 4.0000 mg | Freq: Four times a day (QID) | INTRAMUSCULAR | Status: DC | PRN

## 2016-06-04 MED ORDER — ONDANSETRON HCL 4 MG PO TABS: 4.0000 mg | ORAL_TABLET | Freq: Four times a day (QID) | ORAL | Status: DC | PRN

## 2016-06-04 MED ORDER — INSULIN GLARGINE 100 UNIT/ML ~~LOC~~ SOLN
70.0000 [IU] | Freq: Every day | SUBCUTANEOUS | Status: DC
  Administered 2016-06-04: 70 [IU] via SUBCUTANEOUS
  Filled 2016-06-04: qty 0.7

## 2016-06-04 MED ORDER — INSULIN ASPART 100 UNIT/ML ~~LOC~~ SOLN
7.0000 [IU] | Freq: Three times a day (TID) | SUBCUTANEOUS | Status: DC
  Administered 2016-06-04 – 2016-06-05 (×4): 7 [IU] via SUBCUTANEOUS

## 2016-06-04 MED ORDER — ABACAVIR-DOLUTEGRAVIR-LAMIVUD 600-50-300 MG PO TABS
1.0000 | ORAL_TABLET | Freq: Every day | ORAL | Status: DC
Start: 2016-06-04 — End: 2016-06-05
  Administered 2016-06-04 – 2016-06-05 (×2): 1 via ORAL
  Filled 2016-06-04 (×3): qty 1

## 2016-06-04 MED ORDER — DARUNAVIR-COBICISTAT 800-150 MG PO TABS
1.0000 | ORAL_TABLET | Freq: Every day | ORAL | Status: DC
Start: 2016-06-04 — End: 2016-06-05
  Administered 2016-06-04 – 2016-06-05 (×2): 1 via ORAL
  Filled 2016-06-04 (×3): qty 1

## 2016-06-04 MED ORDER — ACETAMINOPHEN 325 MG PO TABS
650.0000 mg | ORAL_TABLET | Freq: Four times a day (QID) | ORAL | Status: DC | PRN
  Administered 2016-06-04: 650 mg via ORAL
  Filled 2016-06-04 (×2): qty 2

## 2016-06-04 MED ORDER — VANCOMYCIN HCL IN DEXTROSE 1-5 GM/200ML-% IV SOLN: 1000.0000 mg | Freq: Once | INTRAVENOUS | Status: DC

## 2016-06-04 NOTE — H&P (Addendum)
History and Physical    Keith [name]:[id number] DOB: 01/21/1966 DOA: 06/04/2016  PCP: [name], MD  Patient coming from: Home.  Chief Complaint: Left middle finger swelling.  HPI: Keith [name] is a 51 y.o. male with history of diabetes mellitus type 2, HIV presents to the ER because of persistent swelling of his left little finger. Patient states he noticed the swelling progressively worsening for the last 1 week. Denies any trauma or insect bite. Denies fever or chills.   ED Course: X-rays in the ER shows swelling of the left little finger. ER physician discussed with on-call hand surgeon Dr. Grandville Silos who advised incision and drainage and ER physician at this time is about to do the incision and drainage. Patient at this time is able to flex is rest and able to make a complete fist.  Review of Systems: As per HPI, rest all negative.   Past Medical History:  Diagnosis Date  . AIDS ([place])   . Chronic knee pain    right  . Chronic pain   . Diabetes mellitus   . Diabetes type 2, uncontrolled ([place])    HgA1c 17.6 (04/27/2010)  . Erectile dysfunction   . Genital warts   . HIV (human immunodeficiency virus infection) ([place]) 2009   CD4 count 100, VL 13800 (05/01/2010)  . Hypertension   . Osteomyelitis [facility])    h/o hand    Past Surgical History:  Procedure Laterality Date  . HERNIA REPAIR    . I&D EXTREMITY Left 08/21/2014   Procedure: INCISION AND DRAINAGE LEFT SMALL FINGER;  Surgeon: Leanora Cover, MD;  Location: [place];  Service: Orthopedics;  Laterality: Left;  . MINOR IRRIGATION AND DEBRIDEMENT OF WOUND Right 04/22/2014   Procedure: IRRIGATION AND DEBRIDEMENT OF RIGHT NECK ABCESS;  Surgeon: Jerrell Belfast, MD;  Location: [place];  Service: ENT;  Laterality: Right;  . MULTIPLE EXTRACTIONS WITH ALVEOLOPLASTY N/A 01/18/2013   Procedure: MULTIPLE EXTRACION 3, 6, 7, 10, 11, 13, 21, 22, 27, 28, 29, 30 WITH ALVEOLOPLASTY;  Surgeon: Gae Bon, DDS;  Location: [place];   Service: Oral Surgery;  Laterality: N/A;     reports that he has never smoked. He has never used smokeless tobacco. He reports that he does not drink alcohol or use drugs.  Allergies  Allergen Reactions  . Sulfa Antibiotics Itching    Family History  Problem Relation Age of Onset  . Hypertension Mother   . Arthritis Father   . Hypertension Father   . Hypertension Brother   . Cancer Maternal Grandmother 73    unknown type of cancer  . Depression Paternal Grandmother     Prior to Admission medications   Medication Sig Start Date End Date Taking? Authorizing Provider  abacavir-dolutegravir-lamiVUDine (TRIUMEQ) 600-50-300 MG tablet Take 1 tablet by mouth daily. 06/01/15  Yes [name], MD  darunavir-cobicistat (PREZCOBIX) 800-150 MG tablet Take 1 tablet by mouth daily. Take with food. 06/01/15  Yes [name], MD  Insulin Glargine (LANTUS SOLOSTAR) 100 UNIT/ML Solostar Pen Inject 60 Units into the skin daily at 10 pm. Patient taking differently: Inject 70 Units into the skin daily at 10 pm.  06/01/14  Yes [name], MD  insulin lispro (HUMALOG) 100 UNIT/ML KiwkPen Inject 0.08 mLs (8 Units total) into the skin 3 (three) times daily. Patient taking differently: Inject 7 Units into the skin 3 (three) times daily.  02/21/14  Yes [name], MD  Oxycodone HCl 10 MG TABS  Take 10 mg by mouth 3 (three) times daily. Take 1 tablet 3 times a day. Alternate with naproxen 500 mg every 4 to 6 hours 05/07/16  Yes Historical Provider, MD  dapsone 100 MG tablet TAKE 1 TABLET BY MOUTH EVERY DAY. NEED APPOINTMENT Patient not taking: Reported on 05/13/2016 09/19/14   [name], MD  HYDROcodone-acetaminophen (NORCO/VICODIN) 5-325 MG tablet Take 1-2 tablets by mouth every 6 (six) hours as needed. Patient not taking: Reported on 06/04/2016 04/27/16   Veryl Speak, MD  ibuprofen (ADVIL,MOTRIN) 800 MG tablet Take 1 tablet (800 mg total) by mouth every 8 (eight) hours as needed for mild  pain. Patient not taking: Reported on 06/04/2016 03/04/16   Kristen N Ward, DO  imiquimod (ALDARA) 5 % cream Apply topically 3 (three) times a week. Patient not taking: Reported on 05/13/2016 01/30/15   [name], MD  LORazepam (ATIVAN) 1 MG tablet Take 1 tablet (1 mg total) by mouth every 6 (six) hours as needed for anxiety. Patient not taking: Reported on 06/04/2016 01/30/15   [name], MD    Physical Exam: Vitals:   06/04/16 0330 06/04/16 0345 06/04/16 0400 06/04/16 0430  BP:    137/86  Pulse: 99 92 96 93  Resp:   18 18  Temp:      SpO2: 96% 92% 94% 94%  Weight:      Height:          Constitutional: Moderately built and nourished. Vitals:   06/04/16 0330 06/04/16 0345 06/04/16 0400 06/04/16 0430  BP:    137/86  Pulse: 99 92 96 93  Resp:   18 18  Temp:      SpO2: 96% 92% 94% 94%  Weight:      Height:       Eyes: Anicteric. No pallor. ENMT: No discharge from the ears eyes nose or mouth. Neck: No mass felt. No neck rigidity. No JVD appreciated. Respiratory: No rhonchi or crepitations. Cardiovascular: S1 and S2 heard. Abdomen: Soft nontender bowel sounds present. No guarding or rigidity. Musculoskeletal: Swelling of the left middle finger involving the whole left finger. Able to make a fist. Skin: See musculoskeletal system. Neurologic: Alert awake oriented to time place and person. Moves all extremities. Psychiatric: Appears normal. Normal affect.   Labs on Admission: I have personally reviewed following labs and imaging studies  CBC:  Recent Labs Lab 06/04/16 0155  WBC 12.6*  NEUTROABS 9.5*  HGB 12.8*  HCT 37.9*  MCV 80.8  PLT 409   Basic Metabolic Panel:  Recent Labs Lab 06/04/16 0155  NA 130*  K 4.1  CL 94*  CO2 22  GLUCOSE 446*  BUN 18  CREATININE 1.19  CALCIUM 9.3   GFR: Estimated Creatinine Clearance: 92.9 mL/min (by C-G formula based on SCr of 1.19 mg/dL). Liver Function Tests: No results for input(s): AST, ALT, ALKPHOS,  BILITOT, PROT, ALBUMIN in the last 168 hours. No results for input(s): LIPASE, AMYLASE in the last 168 hours. No results for input(s): AMMONIA in the last 168 hours. Coagulation Profile: No results for input(s): INR, PROTIME in the last 168 hours. Cardiac Enzymes: No results for input(s): CKTOTAL, CKMB, CKMBINDEX, TROPONINI in the last 168 hours. BNP (last 3 results) No results for input(s): PROBNP in the last 8760 hours. HbA1C: No results for input(s): HGBA1C in the last 72 hours. CBG: No results for input(s): GLUCAP in the last 168 hours. Lipid Profile: No results for input(s): CHOL, HDL, LDLCALC, TRIG, CHOLHDL, LDLDIRECT in  the last 72 hours. Thyroid Function Tests: No results for input(s): TSH, T4TOTAL, FREET4, T3FREE, THYROIDAB in the last 72 hours. Anemia Panel: No results for input(s): VITAMINB12, FOLATE, FERRITIN, TIBC, IRON, RETICCTPCT in the last 72 hours. Urine analysis:    Component Value Date/Time   COLORURINE STRAW (A) 04/26/2016 2346   APPEARANCEUR CLEAR 04/26/2016 2346   LABSPEC 1.027 04/26/2016 2346   PHURINE 5.0 04/26/2016 2346   GLUCOSEU >=500 (A) 04/26/2016 2346   HGBUR SMALL (A) 04/26/2016 2346   BILIRUBINUR NEGATIVE 04/26/2016 2346   [place] 04/26/2016 2346   PROTEINUR NEGATIVE 04/26/2016 2346   UROBILINOGEN 1.0 12/10/2014 2334   NITRITE NEGATIVE 04/26/2016 2346   LEUKOCYTESUR NEGATIVE 04/26/2016 2346   Sepsis Labs: @LABRCNTIP (procalcitonin:4,lacticidven:4) )No results found for this or any previous visit (from the past 240 hour(s)).   Radiological Exams on Admission: Dg Hand 2 View Right  Result Date: 06/04/2016 CLINICAL DATA:  Fifth finger pain, possibly from hang nail. Assess for gas. History of HIV, hand osteomyelitis and diabetes. EXAM: RIGHT HAND - 2 VIEW COMPARISON:  RIGHT second finger radiograph March 08, 2015. FINDINGS: No acute fracture deformity or dislocation. Joint spaces intact without erosions. No destructive bony lesions.  Marked fifth digit soft tissue swelling without subcutaneous gas or radiopaque foreign bodies. IMPRESSION: Marked fifth finger soft tissue swelling, no gas or acute osseous process. Electronically Signed   By: Elon Alas M.D.   On: 06/04/2016 02:16     Assessment/Plan Active Problems:   Essential hypertension   Hyperglycemia due to type 2 diabetes mellitus (H[facility])   HIV disease ([facility])   Cellulitis and abscess of hand   Cellulitis of left hand    1. Cellulitis and abscess of the left little finger - ER physician after discussing with  the on-call hand surgeon Dr. Grandville Silos is planning to have incision and drainage done. Since patient has immunocompromised state will observe in the hospital with IV antibiotics. Follow cultures. 2. Diabetes mellitus type 2 with hyperglycemia - not in DKA. Patient states he did take his Lantus last night of 70 units. Last hemoglobin A1c in our system 2 years ago was 15.3. I have placed patient on moderate dose sliding scale. Recheck hemoglobin A1c. Closely follow CBGs and metabolic panel. Will also place patient on hydration. 3. HIV - last CD4 count this month was 210. Continue antiretroviral.   DVT prophylaxis: SCDs for now which can be changed to Lovenox if there is no further procedures anticipated. Code Status: Full code.  Family Communication: Discussed with patient.  Disposition Plan: Home.  Consults called: Wound team.  Admission status: Observation.    Rise Patience MD [facility] Pager [phone].  If 7PM-7AM, please contact night-coverage [email or web address] Password [facility]  06/04/2016, 5:23 AM

## 2016-06-04 NOTE — ED Notes (Signed)
Patient transported to X-ray 

## 2016-06-04 NOTE — ED Provider Notes (Signed)
[facility] DEPT Provider Note   CSN: [id number] Arrival date & time: 06/03/16  2340   By signing my name below, I, Delton Prairie, attest that this documentation has been prepared under the direction and in the presence of Varney Biles, MD  Electronically Signed: Delton Prairie, ED Scribe. 06/04/16. 1:09 AM.   History   Chief Complaint Chief Complaint  Patient presents with  . Hand Pain  . Knee Pain   The history is provided by the patient. No language interpreter was used.   HPI Comments:  Keith [name] is a 51 y.o. male, with a hx of DM on insulin and chronic knee pain, who presents to the Emergency Department complaining of worsening right hand pain onset 3 days. His pain is worse to the touch. Pt also reports associated swelling onset 2 days and right knee pain. He notes his knee intermittently gives out with ambulation. No alleviating factors noted. Pt denies drug use or any other associated symptoms. No other complaints noted.   Past Medical History:  Diagnosis Date  . AIDS ([place])   . Chronic knee pain    right  . Chronic pain   . Diabetes mellitus   . Diabetes type 2, uncontrolled ([place])    HgA1c 17.6 (04/27/2010)  . Erectile dysfunction   . Genital warts   . HIV (human immunodeficiency virus infection) ([place]) 2009   CD4 count 100, VL 13800 (05/01/2010)  . Hypertension   . Osteomyelitis [facility])    h/o hand    Patient Active Problem List   Diagnosis Date Noted  . Cellulitis and abscess of hand 06/04/2016  . Cellulitis of left hand 06/04/2016  . Onychomycosis of multiple toenails with type 2 diabetes mellitus ([place]) 08/29/2015  . MRSA carrier 04/20/2014  . Testosterone deficiency 04/20/2014  . Genital warts 02/22/2014  . HIV disease ([place])   . Hyperglycemia due to type 2 diabetes mellitus ([place]) 02/04/2014  . Dental anomaly 11/20/2012  . Arthritis of right knee 02/23/2012  . Hyperlipidemia 11/10/2011  . Chronic pain 08/07/2011  . Meralgia paraesthetica  04/23/2011  . ERECTILE DYSFUNCTION 08/22/2008  . Essential hypertension 05/19/2008    Past Surgical History:  Procedure Laterality Date  . HERNIA REPAIR    . I&D EXTREMITY Left 08/21/2014   Procedure: INCISION AND DRAINAGE LEFT SMALL FINGER;  Surgeon: Leanora Cover, MD;  Location: [facility];  Service: Orthopedics;  Laterality: Left;  . MINOR IRRIGATION AND DEBRIDEMENT OF WOUND Right 04/22/2014   Procedure: IRRIGATION AND DEBRIDEMENT OF RIGHT NECK ABCESS;  Surgeon: Jerrell Belfast, MD;  Location: [facility];  Service: ENT;  Laterality: Right;  . MULTIPLE EXTRACTIONS WITH ALVEOLOPLASTY N/A 01/18/2013   Procedure: MULTIPLE EXTRACION 3, 6, 7, 10, 11, 13, 21, 22, 27, 28, 29, 30 WITH ALVEOLOPLASTY;  Surgeon: Gae Bon, DDS;  Location: [facility];  Service: Oral Surgery;  Laterality: N/A;       Home Medications    Prior to Admission medications   Medication Sig Start Date End Date Taking? Authorizing Provider  abacavir-dolutegravir-lamiVUDine (TRIUMEQ) 600-50-300 MG tablet Take 1 tablet by mouth daily. 06/01/15  Yes [name], MD  darunavir-cobicistat (PREZCOBIX) 800-150 MG tablet Take 1 tablet by mouth daily. Take with food. 06/01/15  Yes [name], MD  Insulin Glargine (LANTUS SOLOSTAR) 100 UNIT/ML Solostar Pen Inject 60 Units into the skin daily at 10 pm. Patient taking differently: Inject 70 Units into the skin daily at 10 pm.  06/01/14  Yes [name], MD  insulin  lispro (HUMALOG) 100 UNIT/ML KiwkPen Inject 0.08 mLs (8 Units total) into the skin 3 (three) times daily. Patient taking differently: Inject 7 Units into the skin 3 (three) times daily.  02/21/14  Yes [name], MD  Oxycodone HCl 10 MG TABS Take 10 mg by mouth 3 (three) times daily. Take 1 tablet 3 times a day. Alternate with naproxen 500 mg every 4 to 6 hours 05/07/16  Yes Historical Provider, MD  dapsone 100 MG tablet TAKE 1 TABLET BY MOUTH EVERY DAY. NEED APPOINTMENT Patient not taking: Reported on 05/13/2016 09/19/14    [name], MD  HYDROcodone-acetaminophen (NORCO/VICODIN) 5-325 MG tablet Take 1-2 tablets by mouth every 6 (six) hours as needed. Patient not taking: Reported on 06/04/2016 04/27/16   Veryl Speak, MD  ibuprofen (ADVIL,MOTRIN) 800 MG tablet Take 1 tablet (800 mg total) by mouth every 8 (eight) hours as needed for mild pain. Patient not taking: Reported on 06/04/2016 03/04/16   Kristen N Ward, DO  imiquimod (ALDARA) 5 % cream Apply topically 3 (three) times a week. Patient not taking: Reported on 05/13/2016 01/30/15   [name], MD  LORazepam (ATIVAN) 1 MG tablet Take 1 tablet (1 mg total) by mouth every 6 (six) hours as needed for anxiety. Patient not taking: Reported on 06/04/2016 01/30/15   [name], MD    Family History Family History  Problem Relation Age of Onset  . Hypertension Mother   . Arthritis Father   . Hypertension Father   . Hypertension Brother   . Cancer Maternal Grandmother 61    unknown type of cancer  . Depression Paternal Grandmother     Social History Social History  Substance Use Topics  . Smoking status: Never Smoker  . Smokeless tobacco: Never Used  . Alcohol use No     Allergies   Sulfa antibiotics   Review of Systems Review of Systems 10 systems reviewed and all are negative for acute change except as noted in the HPI.  Physical Exam Updated Vital Signs BP 137/86   Pulse 93   Temp 98.1 F (36.7 C)   Resp 18   Ht 5\' 10"  (1.778 m)   Wt 251 lb (113.9 kg)   SpO2 94%   BMI 36.01 kg/m   Physical Exam  Constitutional: He is oriented to person, place, and time. He appears well-developed and well-nourished.  HENT:  Head: Normocephalic.  Eyes: EOM are normal.  Neck: Normal range of motion.  Pulmonary/Chest: Effort normal.  Abdominal: He exhibits no distension.  Musculoskeletal: Normal range of motion. He exhibits edema. He exhibits no tenderness or deformity.  Right hand: Hand is swollen. See picture.  Right knee: No  gross swelling, edema or TTP. No warmth to touch.   Neurological: He is alert and oriented to person, place, and time.  Psychiatric: He has a normal mood and affect.  Nursing note and vitals reviewed.         POST I & d      ED Treatments / Results  DIAGNOSTIC STUDIES:  Oxygen Saturation is 96% on RA, normal by my interpretation.    COORDINATION OF CARE:  12:51 AM Discussed treatment plan with pt at bedside and pt agreed to plan.  Labs (all labs ordered are listed, but only abnormal results are displayed) Labs Reviewed  CBC WITH DIFFERENTIAL/PLATELET - Abnormal; Notable for the following:       Result Value   WBC 12.6 (*)    Hemoglobin 12.8 (*)  HCT 37.9 (*)    Neutro Abs 9.5 (*)    All other components within normal limits  BASIC METABOLIC PANEL - Abnormal; Notable for the following:    Sodium 130 (*)    Chloride 94 (*)    Glucose, Bld 446 (*)    All other components within normal limits  CBC WITH DIFFERENTIAL/PLATELET - Abnormal; Notable for the following:    WBC 11.9 (*)    Hemoglobin 12.5 (*)    HCT 37.6 (*)    Neutro Abs 8.3 (*)    All other components within normal limits  CBG MONITORING, ED - Abnormal; Notable for the following:    Glucose-Capillary 477 (*)    All other components within normal limits  AEROBIC CULTURE (SUPERFICIAL SPECIMEN)  HEMOGLOBIN H8I  BASIC METABOLIC PANEL    EKG  EKG Interpretation None       Radiology Dg Hand 2 View Right  Result Date: 06/04/2016 CLINICAL DATA:  Fifth finger pain, possibly from hang nail. Assess for gas. History of HIV, hand osteomyelitis and diabetes. EXAM: RIGHT HAND - 2 VIEW COMPARISON:  RIGHT second finger radiograph March 08, 2015. FINDINGS: No acute fracture deformity or dislocation. Joint spaces intact without erosions. No destructive bony lesions. Marked fifth digit soft tissue swelling without subcutaneous gas or radiopaque foreign bodies. IMPRESSION: Marked fifth finger soft tissue  swelling, no gas or acute osseous process. Electronically Signed   By: Elon Alas M.D.   On: 06/04/2016 02:16    Procedures .Nerve Block Date/Time: 06/04/2016 5:59 AM Performed by: Varney Biles Authorized by: Varney Biles   Consent:    Consent obtained:  Written   Consent given by:  Patient   Risks discussed:  Nerve damage, unsuccessful block, pain, bleeding, allergic reaction and infection   Alternatives discussed:  No treatment Indications:    Indications:  Procedural anesthesia and pain relief Location:    Body area:  Upper extremity   Upper extremity nerve:  Metacarpal   Laterality:  Right Pre-procedure details:    Skin preparation:  2% chlorhexidine   Preparation: Patient was prepped and draped in usual sterile fashion   Procedure details (see MAR for exact dosages):    Block needle gauge:  25 G   Anesthetic injected:  Lidocaine 1% WITH epi   Injection procedure:  Anatomic landmarks identified Post-procedure details:    Patient tolerance of procedure:  Tolerated well, no immediate complications .Marland KitchenIncision and Drainage Date/Time: 06/04/2016 6:03 AM Performed by: Varney Biles Authorized by: Varney Biles   Consent:    Consent obtained:  Written   Consent given by:  Patient   Risks discussed:  Bleeding, incomplete drainage, pain, damage to other organs and infection   Alternatives discussed:  No treatment and delayed treatment Location:    Type:  Abscess   Location:  Upper extremity   Upper extremity location:  Finger   Finger location:  R ring finger Pre-procedure details:    Skin preparation:  Betadine Procedure type:    Complexity:  Complex Procedure details:    Needle aspiration: no     Incision types:  Single straight   Incision depth:  Subcutaneous   Scalpel blade:  10   Wound management:  Irrigated with saline and extensive cleaning   Drainage:  Purulent   Drainage amount:  Copious   Packing materials:  1/4 in iodoform  gauze Post-procedure details:    Patient tolerance of procedure:  Tolerated well, no immediate complications   (including critical care time)  Medications Ordered in ED Medications  oxyCODONE (Oxy IR/ROXICODONE) immediate release tablet 10 mg (not administered)  abacavir-dolutegravir-lamiVUDine (TRIUMEQ) 600-50-300 MG per tablet 1 tablet (not administered)  darunavir-cobicistat (PREZCOBIX) 800-150 MG per tablet 1 tablet (not administered)  insulin glargine (LANTUS) injection 70 Units (not administered)  insulin aspart (novoLOG) injection 7 Units (not administered)  acetaminophen (TYLENOL) tablet 650 mg (not administered)    Or  acetaminophen (TYLENOL) suppository 650 mg (not administered)  ondansetron (ZOFRAN) tablet 4 mg (not administered)    Or  ondansetron (ZOFRAN) injection 4 mg (not administered)  insulin aspart (novoLOG) injection 0-15 Units (not administered)  0.9 %  sodium chloride infusion ( Intravenous New Bag/Given 06/04/16 0539)  vancomycin (VANCOCIN) 2,000 mg in sodium chloride 0.9 % 500 mL IVPB (not administered)  vancomycin (VANCOCIN) IVPB 1000 mg/200 mL premix (not administered)  clindamycin (CLEOCIN) IVPB 600 mg (600 mg Intravenous New Bag/Given 06/04/16 0254)  lidocaine-EPINEPHrine (XYLOCAINE W/EPI) 2 %-1:200000 (PF) injection 10 mL (10 mLs Infiltration Given by Other 06/04/16 0250)  insulin aspart (novoLOG) injection 10 Units (10 Units Subcutaneous Given 06/04/16 0551)     Initial Impression / Assessment and Plan / ED Course  I have reviewed the triage vital signs and the nursing notes.  Pertinent labs & imaging results that were available during my care of the patient were reviewed by me and considered in my medical decision making (see chart for details).     1:55 AM Consulted with Dr. Grandville Silos who advised I&D of the abscess. DR. Grandville Silos informed me that since the abscess is subcutaneous and in the dorsum, the risk for damage to nerve/vascular  Structures is non  existent, and we need to avoid cutting the extensor surface attached to the bone. He recommended that after the I&d we should place a packing and admit, HAND SURGERY on call can be consulted if needed.    Pt with hx of HIV, DM comes in with cc of hand pain. He has a hand abscess and cellulitis. Pt will need admission. He bites his nails all the time, and so that could have been the reason for the infection. We will start clinda.   Final Clinical Impressions(s) / ED Diagnoses   Final diagnoses:  Cellulitis and abscess of hand    New Prescriptions New Prescriptions   No medications on file   I personally performed the services described in this documentation, which was scribed in my presence. The recorded information has been reviewed and is accurate.     Varney Biles, MD 06/04/16 [phone]

## 2016-06-04 NOTE — Consult Note (Signed)
ORTHOPAEDIC CONSULTATION HISTORY & PHYSICAL REQUESTING PHYSICIAN: Bonnielee Haff, MD  Chief Complaint: right small finger infection HPI: Keith [name] is a 51 y.o. male who was admitted early today with a R SF infection.  "I&D" was performed in the ED by EDP.  On hospitalist service for appropriate medical management of his infection, including ascending lymphangitis.  Past Medical History:  Diagnosis Date  . AIDS ([place])   . Chronic knee pain    right  . Chronic pain   . Diabetes mellitus   . Diabetes type 2, uncontrolled ([place])    HgA1c 17.6 (04/27/2010)  . Erectile dysfunction   . Genital warts   . HIV (human immunodeficiency virus infection) ([place]) 2009   CD4 count 100, VL 13800 (05/01/2010)  . Hypertension   . Osteomyelitis [facility])    h/o hand   Past Surgical History:  Procedure Laterality Date  . HERNIA REPAIR    . I&D EXTREMITY Left 08/21/2014   Procedure: INCISION AND DRAINAGE LEFT SMALL FINGER;  Surgeon: Leanora Cover, MD;  Location: [facility];  Service: Orthopedics;  Laterality: Left;  . MINOR IRRIGATION AND DEBRIDEMENT OF WOUND Right 04/22/2014   Procedure: IRRIGATION AND DEBRIDEMENT OF RIGHT NECK ABCESS;  Surgeon: Jerrell Belfast, MD;  Location: [facility];  Service: ENT;  Laterality: Right;  . MULTIPLE EXTRACTIONS WITH ALVEOLOPLASTY N/A 01/18/2013   Procedure: MULTIPLE EXTRACION 3, 6, 7, 10, 11, 13, 21, 22, 27, 28, 29, 30 WITH ALVEOLOPLASTY;  Surgeon: Gae Bon, DDS;  Location: [facility];  Service: Oral Surgery;  Laterality: N/A;   Social History   Social History  . Marital status: Single    Spouse name: N/A  . Number of children: 84  . Years of education: 5   Social History Main Topics  . Smoking status: Never Smoker  . Smokeless tobacco: Never Used  . Alcohol use No  . Drug use: No  . Sexual activity: Yes    Partners: Female     Comment: given condoms   Other Topics Concern  . None   Social History Narrative   Worked for the city of [place] for 18 years.     Unemployed.    Applying for disability.   Medicaid patient.   Family History  Problem Relation Age of Onset  . Hypertension Mother   . Arthritis Father   . Hypertension Father   . Hypertension Brother   . Cancer Maternal Grandmother 61    unknown type of cancer  . Depression Paternal Grandmother    Allergies  Allergen Reactions  . Sulfa Antibiotics Itching   Prior to Admission medications   Medication Sig Start Date End Date Taking? Authorizing Provider  abacavir-dolutegravir-lamiVUDine (TRIUMEQ) 600-50-300 MG tablet Take 1 tablet by mouth daily. 06/01/15  Yes [name], MD  darunavir-cobicistat (PREZCOBIX) 800-150 MG tablet Take 1 tablet by mouth daily. Take with food. 06/01/15  Yes [name], MD  Insulin Glargine (LANTUS SOLOSTAR) 100 UNIT/ML Solostar Pen Inject 60 Units into the skin daily at 10 pm. Patient taking differently: Inject 70 Units into the skin daily at 10 pm.  06/01/14  Yes [name], MD  insulin lispro (HUMALOG) 100 UNIT/ML KiwkPen Inject 0.08 mLs (8 Units total) into the skin 3 (three) times daily. Patient taking differently: Inject 7 Units into the skin 3 (three) times daily.  02/21/14  Yes [name], MD  Oxycodone HCl 10 MG TABS Take 10 mg by mouth 3 (three) times daily. Take 1 tablet 3  times a day. Alternate with naproxen 500 mg every 4 to 6 hours 05/07/16  Yes Historical Provider, MD  dapsone 100 MG tablet TAKE 1 TABLET BY MOUTH EVERY DAY. NEED APPOINTMENT Patient not taking: Reported on 05/13/2016 09/19/14   [name], MD  HYDROcodone-acetaminophen (NORCO/VICODIN) 5-325 MG tablet Take 1-2 tablets by mouth every 6 (six) hours as needed. Patient not taking: Reported on 06/04/2016 04/27/16   Veryl Speak, MD  ibuprofen (ADVIL,MOTRIN) 800 MG tablet Take 1 tablet (800 mg total) by mouth every 8 (eight) hours as needed for mild pain. Patient not taking: Reported on 06/04/2016 03/04/16   Kristen N Ward, DO  imiquimod (ALDARA) 5 % cream Apply  topically 3 (three) times a week. Patient not taking: Reported on 05/13/2016 01/30/15   [name], MD  LORazepam (ATIVAN) 1 MG tablet Take 1 tablet (1 mg total) by mouth every 6 (six) hours as needed for anxiety. Patient not taking: Reported on 06/04/2016 01/30/15   [name], MD   Dg Hand 2 View Right  Result Date: 06/04/2016 CLINICAL DATA:  Fifth finger pain, possibly from hang nail. Assess for gas. History of HIV, hand osteomyelitis and diabetes. EXAM: RIGHT HAND - 2 VIEW COMPARISON:  RIGHT second finger radiograph March 08, 2015. FINDINGS: No acute fracture deformity or dislocation. Joint spaces intact without erosions. No destructive bony lesions. Marked fifth digit soft tissue swelling without subcutaneous gas or radiopaque foreign bodies. IMPRESSION: Marked fifth finger soft tissue swelling, no gas or acute osseous process. Electronically Signed   By: Elon Alas M.D.   On: 06/04/2016 02:16    Positive ROS: All other systems have been reviewed and were otherwise negative with the exception of those mentioned in the HPI and as above.  Physical Exam: Vitals: Refer to EMR. Constitutional:  WD, WN, NAD HEENT:  NCAT, EOMI Neuro/Psych:  Alert & oriented to person, place, and time; appropriate mood & affect Lymphatic: No generalized extremity edema or lymphadenopathy Extremities / MSK:  The extremities are normal with respect to appearance, ranges of motion, joint stability, muscle strength/tone, sensation, & perfusion except as otherwise noted:  Right small finger dressing is removed.  There is a packing that appears to been placed between the epidermis and the dermis.  There is an incision through the epidermis alone, a proximally an inch in length, with the fluid has been drained, and the distended epidermis now lies upon the underlying dermis.  The remainder of the digit is moderately swollen on the dorsum, and the digit is held fully extended.  As I removed the  sloughing epidermis, there appeared to be pus from the nail complex.  It was unclear whether there was a breech in the nailbed or not.  Assessment: Right small finger infection, likely paronychia that extended as an intradermal abscess, with epidermal sloughing.  Plan/Procedure: I discussed with him the need to remove the nail plate to further understand the extent of the infection and allow for irrigation and drainage.  Verbal consent was obtained to proceed.  Digital block was performed by me with a mixture of lidocaine and Marcaine.  The remainder of the residual epidermal slough was removed, back to good adherent epidermis.  The nail plate was removed.  There clearly had been fluid the dissected between nail and the eponychial fold and between the nail and the hyponychial fold.  Once the nail plate itself was removed, the fold was probed, and there appeared to be no breech.  It was all  irrigated copiously under running water in the sink and then dressed with Xeroform, gauze, and loose Coban.  Recommendations:  1.  Infection care per hospitalists, transitioning to his primary care physician upon discharge. 2.  I will plan to oversee the specific hand surgical issues, such as functional restoration, regrowth of a normal nail plate, etc.--Follow-up with me in 2 weeks for these items.   Rayvon Char Grandville Silos, [place], [place]  [address] Office: [phone] Mobile: [phone]  06/04/2016, 12:41 PM

## 2016-06-04 NOTE — ED Notes (Signed)
ED Provider at bedside. 

## 2016-06-04 NOTE — Discharge Instructions (Signed)
Wound Care: Remove dressing.  Wash involved digit with soap/running water/washrag Re-apply dressing--xeroform on "raw" areas only, 1 4x4, light gauze or coban overwrap to keep 4x4 in place Repeat daily

## 2016-06-04 NOTE — Progress Notes (Signed)
Pharmacy Antibiotic Note  Keith [name] is a 51 y.o. male admitted on 06/04/2016 with R hand cellulitis.  Pharmacy has been consulted for Vancomycin dosing.  Plan: Vancomycin 2gm IV now then 1gm IV q12h Will f/u micro data, renal function, and pt's clinical condition Vanc trough at C[facility]   Height: 5\' 10"  (177.8 cm) Weight: 251 lb (113.9 kg) IBW/kg (Calculated) : 73  Temp (24hrs), Avg:98.1 F (36.7 C), Min:98.1 F (36.7 C), Max:98.1 F (36.7 C)   Recent Labs Lab 06/04/16 0155  WBC 12.6*  CREATININE 1.19    Estimated Creatinine Clearance: 92.9 mL/min (by C-G formula based on SCr of 1.19 mg/dL).    Allergies  Allergen Reactions  . Sulfa Antibiotics Itching    Antimicrobials this admission: 2/27 Clindamycin x1 2/27 Vanc >>   Dose adjustments this admission: n/a  Microbiology results: Pending  Thank you for allowing pharmacy to be a part of this patient's care.  Sherlon Handing, PharmD, BCPS Clinical pharmacist, pager [phone] 06/04/2016 5:23 AM

## 2016-06-04 NOTE — Progress Notes (Signed)
Patient admitted earlier this morning. Patient seen and examined. H&P reviewed.  Patient continues to have some pain in his right arm. Swelling is subsiding. Denies any nausea or vomiting.  Vital signs are stable. Noted to be afebrile. Right arm is noted to be slightly more swollen compared to the right. He has a dressing over his right fifth finger. Pulses are present. Lungs are clear to auscultation bilaterally S1, S2 is normal, regular. No S3, S4. Abdomen is soft, nontender, nondistended No significant pedal edema  CBGs noted to be elevated. Sodium 1:30.  Continue management as outlined in the H&P for his cellulitis and abscess. He is on vancomycin. He status post I&D of the finger. Follow up on cultures. Continue Lantus for diabetes. Check HbA1c. Follows with Dr. [name] for HIV. Continue antiretroviral treatment.  We'll continue to follow.  Keith [name] 06/04/2016

## 2016-06-04 NOTE — Consult Note (Addendum)
WOC consult was requested for finger wound prior to hand surgeon involvement.  They have just seen the patient and state they will be following for assessment and plan of care; please refer to their team for questions. Please re-consult if further assistance is needed.  Thank-you,  Julien Girt MSN, [place], [place], [place], [place]

## 2016-06-04 NOTE — Care Management Obs Status (Signed)
[facility] NOTIFICATION   Patient Details  Name: KEL SENN MRN: [id number] Date of Birth: [birth year]   Medicare Observation Status Notification Given:  Yes    Carles Collet, RN 06/04/2016, 12:55 PM

## 2016-06-05 DIAGNOSIS — E1165 Type 2 diabetes mellitus with hyperglycemia: Secondary | ICD-10-CM

## 2016-06-05 DIAGNOSIS — L03119 Cellulitis of unspecified part of limb: Secondary | ICD-10-CM

## 2016-06-05 DIAGNOSIS — B20 Human immunodeficiency virus [HIV] disease: Secondary | ICD-10-CM

## 2016-06-05 DIAGNOSIS — L02519 Cutaneous abscess of unspecified hand: Secondary | ICD-10-CM

## 2016-06-05 DIAGNOSIS — L03114 Cellulitis of left upper limb: Secondary | ICD-10-CM

## 2016-06-05 DIAGNOSIS — I10 Essential (primary) hypertension: Secondary | ICD-10-CM

## 2016-06-05 LAB — CBC
HCT: 36.7 % — ABNORMAL LOW (ref 39.0–52.0)
Hemoglobin: 12.2 g/dL — ABNORMAL LOW (ref 13.0–17.0)
MCH: 26.9 pg (ref 26.0–34.0)
MCHC: 33.2 g/dL (ref 30.0–36.0)
MCV: 80.8 fL (ref 78.0–100.0)
Platelets: 238 10*3/uL (ref 150–400)
RBC: 4.54 MIL/uL (ref 4.22–5.81)
RDW: 12.9 % (ref 11.5–15.5)
WBC: 9.6 10*3/uL (ref 4.0–10.5)

## 2016-06-05 LAB — BASIC METABOLIC PANEL
Anion gap: 7 (ref 5–15)
BUN: 11 mg/dL (ref 6–20)
CO2: 27 mmol/L (ref 22–32)
Calcium: 9.1 mg/dL (ref 8.9–10.3)
Chloride: 100 mmol/L — ABNORMAL LOW (ref 101–111)
Creatinine, Ser: 0.76 mg/dL (ref 0.61–1.24)
GFR calc Af Amer: >60 mL/min (ref >60)
GFR calc non Af Amer: >60 mL/min (ref >60)
Glucose, Bld: 244 mg/dL — ABNORMAL HIGH (ref 65–99)
Potassium: 3.7 mmol/L (ref 3.5–5.1)
Sodium: 134 mmol/L — ABNORMAL LOW (ref 135–145)

## 2016-06-05 LAB — GLUCOSE, CAPILLARY: Glucose-Capillary: 236 mg/dL — ABNORMAL HIGH (ref 65–99)

## 2016-06-05 LAB — HEMOGLOBIN A1C
Hgb A1c MFr Bld: 13.2 % — ABNORMAL HIGH (ref 4.8–5.6)
Mean Plasma Glucose: 332 mg/dL

## 2016-06-05 MED ORDER — DOXYCYCLINE HYCLATE 100 MG PO TABS: 100.0000 mg | ORAL_TABLET | Freq: Two times a day (BID) | ORAL | 0 refills | Status: DC

## 2016-06-05 MED ORDER — OXYCODONE HCL 10 MG PO TABS: 10.0000 mg | ORAL_TABLET | Freq: Three times a day (TID) | ORAL | 0 refills | Status: DC

## 2016-06-05 MED ORDER — CEPHALEXIN 500 MG PO CAPS: 500.0000 mg | ORAL_CAPSULE | Freq: Three times a day (TID) | ORAL | 0 refills | Status: DC

## 2016-06-05 NOTE — Discharge Summary (Signed)
Physician Discharge Summary  Keith [name] YCX:[id number] DOB: Aug 25, 1965 DOA: 06/04/2016  PCP: [name], MD  Admit date: 06/04/2016 Discharge date: 06/05/2016  Admitted From: Home Disposition: Home  Recommendations for Outpatient Follow-up:  1. Follow up with PCP in 1-2 weeks 2. Please obtain BMP/CBC in one week  Home Health: NA Equipment/Devices:NA  Discharge Condition: Stable CODE STATUS: Full Code Diet recommendation: Diet Carb Modified Fluid consistency: Thin; Room service appropriate? Yes Diet - low sodium heart healthy  Brief/Interim Summary: Keith [name] is a 51 y.o. male with history of diabetes mellitus type 2, HIV presents to the ER because of persistent swelling of his left little finger. Patient states he noticed the swelling progressively worsening for the last 1 week. Denies any trauma or insect bite. Denies fever or chills.   Discharge Diagnoses:  Active Problems:   Essential hypertension   Hyperglycemia due to type 2 diabetes mellitus (HCC)   HIV disease (HCC)   Cellulitis and abscess of hand   Cellulitis of left hand   Cellulitis and abscess of the Right little finger  ER physician after discussing with  the on-call hand surgeon Dr. Grandville Silos is planning to have incision and drainage done. Since patient has immunocompromised state will observe in the hospital with IV antibiotics.  I&D repeated by Dr. Grandville Silos at bedside on 2/27. Patient treated with IV vancomycin while in the hospital. Discharge home on doxycycline and Keflex for 7 more days. Patient to follow-up with Dr. Milly Jakob of hand surgery in 2 weeks. Patient reported he is on OxyIR and he was out of it because he was using more for pain in his finger. He requested prescription, I wrote prescription for OxyIR 10 mg for 10 pills to control acute pain after I&D, patient follow-up for PCP for further opioid prescription.  Diabetes mellitus type 2 with hyperglycemia  Was not in DKA,  did not receive his Lantus on admission. Last hemoglobin A1c in our system 2 years ago was 15.3.  I have placed patient on moderate dose sliding scale.  Repeat of his hemoglobin A1c pending at the time of discharge.  HIV - last CD4 count this month was 210. Continue antiretroviral.  HTN Home medications restarted, discharge.  Discharge Instructions  Discharge Instructions    Diet - low sodium heart healthy    Complete by:  As directed    Increase activity slowly    Complete by:  As directed      Allergies as of 06/05/2016      Reactions   Sulfa Antibiotics Itching      Medication List    STOP taking these medications   HYDROcodone-acetaminophen 5-325 MG tablet Commonly known as:  NORCO/VICODIN     TAKE these medications   abacavir-dolutegravir-lamiVUDine 600-50-300 MG tablet Commonly known as:  TRIUMEQ Take 1 tablet by mouth daily.   cephALEXin 500 MG capsule Commonly known as:  KEFLEX Take 1 capsule (500 mg total) by mouth 3 (three) times daily.   dapsone 100 MG tablet TAKE 1 TABLET BY MOUTH EVERY DAY. NEED APPOINTMENT   darunavir-cobicistat 800-150 MG tablet Commonly known as:  PREZCOBIX Take 1 tablet by mouth daily. Take with food.   doxycycline 100 MG tablet Commonly known as:  VIBRA-TABS Take 1 tablet (100 mg total) by mouth 2 (two) times daily.   ibuprofen 800 MG tablet Commonly known as:  ADVIL,MOTRIN Take 1 tablet (800 mg total) by mouth every 8 (eight) hours as needed for mild pain.   imiquimod  5 % cream Commonly known as:  ALDARA Apply topically 3 (three) times a week.   Insulin Glargine 100 UNIT/ML Solostar Pen Commonly known as:  LANTUS SOLOSTAR Inject 60 Units into the skin daily at 10 pm. What changed:  how much to take   insulin lispro 100 UNIT/ML KiwkPen Commonly known as:  HUMALOG Inject 0.08 mLs (8 Units total) into the skin 3 (three) times daily. What changed:  how much to take   LORazepam 1 MG tablet Commonly known as:   ATIVAN Take 1 tablet (1 mg total) by mouth every 6 (six) hours as needed for anxiety.   Oxycodone HCl 10 MG Tabs Take 1 tablet (10 mg total) by mouth 3 (three) times daily. Take 1 tablet 3 times a day. Alternate with naproxen 500 mg every 4 to 6 hours      Follow-up Information    THOMPSON, DAVID A., MD. Schedule an appointment as soon as possible for a visit.   Specialty:  Orthopedic Surgery Why:  for approximately 2 weeks Contact information: [place] [address] [phone]          Allergies  Allergen Reactions  . Sulfa Antibiotics Itching    Consultations:  Hand surgery  Procedures Bedside I&D of R-D5  Radiological studies: Dg Hand 2 View Right  Result Date: 06/04/2016 CLINICAL DATA:  Fifth finger pain, possibly from hang nail. Assess for gas. History of HIV, hand osteomyelitis and diabetes. EXAM: RIGHT HAND - 2 VIEW COMPARISON:  RIGHT second finger radiograph March 08, 2015. FINDINGS: No acute fracture deformity or dislocation. Joint spaces intact without erosions. No destructive bony lesions. Marked fifth digit soft tissue swelling without subcutaneous gas or radiopaque foreign bodies. IMPRESSION: Marked fifth finger soft tissue swelling, no gas or acute osseous process. Electronically Signed   By: Elon Alas M.D.   On: 06/04/2016 02:16     Subjective:  Discharge Exam: Vitals:   06/04/16 0627 06/04/16 1453 06/04/16 2159 06/05/16 0643  BP: (!) 143/87 123/83 (!) 144/97 131/81  Pulse:  92 85 96  Resp: 17 16 18  (!) 22  Temp: 98.4 F (36.9 C) 97.7 F (36.5 C) 98.1 F (36.7 C) (!) 100.6 F (38.1 C)  TempSrc: Oral Oral Oral Oral  SpO2: 100% 97% 100% 98%  Weight: 111.2 kg (245 lb 2.4 oz)     Height: 5\' 10"  (1.778 m)      General: Pt is alert, awake, not in acute distress Cardiovascular: RRR, S1/S2 +, no rubs, no gallops Respiratory: CTA bilaterally, no wheezing, no rhonchi Abdominal: Soft, NT, ND, bowel sounds + Extremities: no  edema, no cyanosis   The results of significant diagnostics from this hospitalization (including imaging, microbiology, ancillary and laboratory) are listed below for reference.    Microbiology: Recent Results (from the past 240 hour(s))  Wound or Superficial Culture     Status: None (Preliminary result)   Collection Time: 06/04/16  5:41 AM  Result Value Ref Range Status   Specimen Description ABSCESS  Final   Special Requests Normal  Final   Gram Stain   Final    FEW WBC PRESENT,BOTH PMN AND MONONUCLEAR MODERATE GRAM POSITIVE COCCI IN PAIRS IN CLUSTERS    Culture PENDING  Incomplete   Report Status PENDING  Incomplete  MRSA PCR Screening     Status: Abnormal   Collection Time: 06/04/16  6:59 AM  Result Value Ref Range Status   MRSA by PCR POSITIVE (A) NEGATIVE Final    Comment:  The GeneXpert MRSA Assay (FDA approved for NASAL specimens only), is one component of a comprehensive MRSA colonization surveillance program. It is not intended to diagnose MRSA infection nor to guide or monitor treatment for MRSA infections. RESULT CALLED TO, READ BACK BY AND VERIFIED WITH: C. HUCKABEE [id number] 02.27.2018 N. MORRIS      Labs: BNP (last 3 results) No results for input(s): BNP in the last 8760 hours. Basic Metabolic Panel:  Recent Labs Lab 06/04/16 0155 06/04/16 0532 06/05/16 0359  NA 130* 130* 134*  K 4.1 4.4 3.7  CL 94* 95* 100*  CO2 22 22 27   GLUCOSE 446* 475* 244*  BUN 18 18 11   CREATININE 1.19 1.21 0.76  CALCIUM 9.3 9.4 9.1   Liver Function Tests: No results for input(s): AST, ALT, ALKPHOS, BILITOT, PROT, ALBUMIN in the last 168 hours. No results for input(s): LIPASE, AMYLASE in the last 168 hours. No results for input(s): AMMONIA in the last 168 hours. CBC:  Recent Labs Lab 06/04/16 0155 06/04/16 0532 06/05/16 0359  WBC 12.6* 11.9* 9.6  NEUTROABS 9.5* 8.3*  --   HGB 12.8* 12.5* 12.2*  HCT 37.9* 37.6* 36.7*  MCV 80.8 81.4 80.8  PLT 219 215 238    Cardiac Enzymes: No results for input(s): CKTOTAL, CKMB, CKMBINDEX, TROPONINI in the last 168 hours. BNP: Invalid input(s): POCBNP CBG:  Recent Labs Lab 06/04/16 0752 06/04/16 1135 06/04/16 1731 06/04/16 2155 06/05/16 0814  GLUCAP 399* 239* 317* 265* 236*   D-Dimer No results for input(s): DDIMER in the last 72 hours. Hgb A1c No results for input(s): HGBA1C in the last 72 hours. Lipid Profile No results for input(s): CHOL, HDL, LDLCALC, TRIG, CHOLHDL, LDLDIRECT in the last 72 hours. Thyroid function studies No results for input(s): TSH, T4TOTAL, T3FREE, THYROIDAB in the last 72 hours.  Invalid input(s): FREET3 Anemia work up No results for input(s): VITAMINB12, FOLATE, FERRITIN, TIBC, IRON, RETICCTPCT in the last 72 hours. Urinalysis    Component Value Date/Time   COLORURINE STRAW (A) 04/26/2016 2346   APPEARANCEUR CLEAR 04/26/2016 2346   LABSPEC 1.027 04/26/2016 2346   PHURINE 5.0 04/26/2016 2346   GLUCOSEU >=500 (A) 04/26/2016 2346   HGBUR SMALL (A) 04/26/2016 2346   BILIRUBINUR NEGATIVE 04/26/2016 2346   KETONESUR NEGATIVE 04/26/2016 2346   PROTEINUR NEGATIVE 04/26/2016 2346   UROBILINOGEN 1.0 12/10/2014 2334   NITRITE NEGATIVE 04/26/2016 2346   LEUKOCYTESUR NEGATIVE 04/26/2016 2346   Sepsis Labs Invalid input(s): PROCALCITONIN,  WBC,  LACTICIDVEN Microbiology Recent Results (from the past 240 hour(s))  Wound or Superficial Culture     Status: None (Preliminary result)   Collection Time: 06/04/16  5:41 AM  Result Value Ref Range Status   Specimen Description ABSCESS  Final   Special Requests Normal  Final   Gram Stain   Final    FEW WBC PRESENT,BOTH PMN AND MONONUCLEAR MODERATE GRAM POSITIVE COCCI IN PAIRS IN CLUSTERS    Culture PENDING  Incomplete   Report Status PENDING  Incomplete  MRSA PCR Screening     Status: Abnormal   Collection Time: 06/04/16  6:59 AM  Result Value Ref Range Status   MRSA by PCR POSITIVE (A) NEGATIVE Final    Comment:         The GeneXpert MRSA Assay (FDA approved for NASAL specimens only), is one component of a comprehensive MRSA colonization surveillance program. It is not intended to diagnose MRSA infection nor to guide or monitor treatment for MRSA infections. RESULT CALLED TO, READ BACK  BY AND VERIFIED WITH: C. HUCKABEE [id number] 02.27.2018 N. MORRIS      Time coordinating discharge: Over 30 minutes  SIGNED:   Birdie Hopes, MD  [facility] 06/05/2016, 10:20 AM Pager   If 7PM-7AM, please contact night-coverage [email or web address] Password [email or web address]

## 2016-06-06 LAB — AEROBIC CULTURE W GRAM STAIN (SUPERFICIAL SPECIMEN): Special Requests: NORMAL

## 2016-06-10 ENCOUNTER — Encounter (HOSPITAL_COMMUNITY): Payer: Self-pay | Admitting: Emergency Medicine

## 2016-06-10 ENCOUNTER — Emergency Department (HOSPITAL_COMMUNITY)
Admission: EM | Admit: 2016-06-10 | Discharge: 2016-06-10 | Disposition: A | Discharge status: Home / Self Care | Payer: Medicare Other | Attending: Emergency Medicine | Admitting: Emergency Medicine

## 2016-06-10 ENCOUNTER — Emergency Department (HOSPITAL_COMMUNITY): Payer: Medicare Other

## 2016-06-10 DIAGNOSIS — X58XXXA Exposure to other specified factors, initial encounter: Secondary | ICD-10-CM | POA: Diagnosis not present

## 2016-06-10 DIAGNOSIS — E119 Type 2 diabetes mellitus without complications: Secondary | ICD-10-CM | POA: Diagnosis not present

## 2016-06-10 DIAGNOSIS — M7989 Other specified soft tissue disorders: Secondary | ICD-10-CM | POA: Diagnosis not present

## 2016-06-10 DIAGNOSIS — M79644 Pain in right finger(s): Secondary | ICD-10-CM | POA: Diagnosis not present

## 2016-06-10 DIAGNOSIS — Y999 Unspecified external cause status: Secondary | ICD-10-CM | POA: Insufficient documentation

## 2016-06-10 DIAGNOSIS — Z79899 Other long term (current) drug therapy: Secondary | ICD-10-CM | POA: Diagnosis not present

## 2016-06-10 DIAGNOSIS — Z794 Long term (current) use of insulin: Secondary | ICD-10-CM | POA: Diagnosis not present

## 2016-06-10 DIAGNOSIS — S6991XA Unspecified injury of right wrist, hand and finger(s), initial encounter: Secondary | ICD-10-CM | POA: Diagnosis not present

## 2016-06-10 DIAGNOSIS — I10 Essential (primary) hypertension: Secondary | ICD-10-CM | POA: Diagnosis not present

## 2016-06-10 DIAGNOSIS — Y929 Unspecified place or not applicable: Secondary | ICD-10-CM | POA: Insufficient documentation

## 2016-06-10 DIAGNOSIS — Y939 Activity, unspecified: Secondary | ICD-10-CM | POA: Insufficient documentation

## 2016-06-10 LAB — CBC WITH DIFFERENTIAL/PLATELET
Basophils Absolute: 0 10*3/uL (ref 0.0–0.1)
Basophils Relative: 0 %
Eosinophils Absolute: 0.1 10*3/uL (ref 0.0–0.7)
Eosinophils Relative: 2 %
HCT: 36.9 % — ABNORMAL LOW (ref 39.0–52.0)
Hemoglobin: 12.6 g/dL — ABNORMAL LOW (ref 13.0–17.0)
Lymphocytes Relative: 28 %
Lymphs Abs: 1.5 10*3/uL (ref 0.7–4.0)
MCH: 27.2 pg (ref 26.0–34.0)
MCHC: 34.1 g/dL (ref 30.0–36.0)
MCV: 79.7 fL (ref 78.0–100.0)
Monocytes Absolute: 0.5 10*3/uL (ref 0.1–1.0)
Monocytes Relative: 9 %
Neutro Abs: 3.3 10*3/uL (ref 1.7–7.7)
Neutrophils Relative %: 61 %
Platelets: 279 10*3/uL (ref 150–400)
RBC: 4.63 MIL/uL (ref 4.22–5.81)
RDW: 12.5 % (ref 11.5–15.5)
WBC: 5.3 10*3/uL (ref 4.0–10.5)

## 2016-06-10 LAB — BASIC METABOLIC PANEL
Anion gap: 10 (ref 5–15)
BUN: 9 mg/dL (ref 6–20)
CO2: 26 mmol/L (ref 22–32)
Calcium: 9.5 mg/dL (ref 8.9–10.3)
Chloride: 95 mmol/L — ABNORMAL LOW (ref 101–111)
Creatinine, Ser: 1.01 mg/dL (ref 0.61–1.24)
GFR calc Af Amer: >60 mL/min (ref >60)
GFR calc non Af Amer: >60 mL/min (ref >60)
Glucose, Bld: 474 mg/dL — ABNORMAL HIGH (ref 65–99)
Potassium: 4.2 mmol/L (ref 3.5–5.1)
Sodium: 131 mmol/L — ABNORMAL LOW (ref 135–145)

## 2016-06-10 MED ORDER — OXYCODONE-ACETAMINOPHEN 5-325 MG PO TABS
2.0000 | ORAL_TABLET | Freq: Once | ORAL | Status: AC
  Administered 2016-06-10: 2 via ORAL
  Filled 2016-06-10: qty 2

## 2016-06-10 MED ORDER — OXYCODONE-ACETAMINOPHEN 5-325 MG PO TABS: 1.0000 | ORAL_TABLET | Freq: Four times a day (QID) | ORAL | 0 refills | Status: AC | PRN

## 2016-06-10 NOTE — ED Notes (Signed)
Pt voids to urinal after waking from sleep.

## 2016-06-10 NOTE — ED Triage Notes (Signed)
Pt sts right pinky infection after having I/D; pt sts increased pain

## 2016-06-10 NOTE — ED Notes (Signed)
Pt states instructions about pain medication, wound care and follow up. Home stable with steady gait.

## 2016-06-10 NOTE — Discharge Instructions (Signed)
There were no abnormalities on x-ray today. A follow up appointment has been scheduled for you with the Hand surgeon on March 13 at 9 AM. Arrive at least 15-20 minutes early. Bring your ID and your insurance card. Continue the antibiotics, as prescribed.   Wound Care (these are the same as the instructions you received upon discharge from the hospital on 2/28): Remove dressing. Wash involved digit with soap/running water/washrag (Wash the finger with the wound just like you would any of the other fingers.) Re-apply dressing--xeroform on "raw" areas only, 1 4x4, light gauze or coban overwrap to keep 4x4 in place Repeat daily  Your blood sugar was noted to be very high today. Please take your medication, as prescribed, and follow up with your primary care provider as soon as possible this week.

## 2016-06-10 NOTE — ED Provider Notes (Signed)
[facility] DEPT Provider Note   CSN: [id number] Arrival date & time: 06/10/16  1021     History   Chief Complaint Chief Complaint  Patient presents with  . Finger Injury    HPI Keith [name] is a 51 y.o. male.  HPI   Keith [name] is a 52 y.o. male, with a history of AIDS, DM, and HTN, presenting to the ED with right small finger pain beginning 2 days ago. Pain is aching, 9/10, and radiates into the right hand. Has not taken anything for his discomfort for the last 2 days because he ran out of his narcotic analgesics and states ibuprofen doesn't work. Endorses liquid draining from the wound also beginning two days ago. Patient states he has not changed the bandage since the original procedure.  Pt underwent bedside I&D of the right pinky finger by EDP on 2/27. Further debridement by Dr. Grandville Silos, hand surgery, later on 2/27. He was discharged with keflex and doxycycline, which he states he has been compliant with. Pt states he was told to follow up with Dr. Grandville Silos in the office two weeks after discharge. Pt states he has an appointment set up, but can not remember when it is.  Denies fever/chills, N/V, neuro deficits, or any other complaints.  Last HIV viral load was undetectable. Last CD4 count was 210. Both values obtained 05/13/16.    Past Medical History:  Diagnosis Date  . AIDS ([place])   . Chronic knee pain    right  . Chronic pain   . Diabetes mellitus   . Diabetes type 2, uncontrolled ([place])    HgA1c 17.6 (04/27/2010)  . Erectile dysfunction   . Genital warts   . HIV (human immunodeficiency virus infection) ([place]) 2009   CD4 count 100, VL 13800 (05/01/2010)  . Hypertension   . Osteomyelitis [facility])    h/o hand    Patient Active Problem List   Diagnosis Date Noted  . Cellulitis and abscess of hand 06/04/2016  . Cellulitis of left hand 06/04/2016  . Onychomycosis of multiple toenails with type 2 diabetes mellitus ([place]) 08/29/2015  . MRSA carrier  04/20/2014  . Testosterone deficiency 04/20/2014  . Genital warts 02/22/2014  . HIV disease ([place])   . Hyperglycemia due to type 2 diabetes mellitus ([place]) 02/04/2014  . Dental anomaly 11/20/2012  . Arthritis of right knee 02/23/2012  . Hyperlipidemia 11/10/2011  . Chronic pain 08/07/2011  . Meralgia paraesthetica 04/23/2011  . ERECTILE DYSFUNCTION 08/22/2008  . Essential hypertension 05/19/2008    Past Surgical History:  Procedure Laterality Date  . HERNIA REPAIR    . I&D EXTREMITY Left 08/21/2014   Procedure: INCISION AND DRAINAGE LEFT SMALL FINGER;  Surgeon: Leanora Cover, MD;  Location: [facility];  Service: Orthopedics;  Laterality: Left;  . MINOR IRRIGATION AND DEBRIDEMENT OF WOUND Right 04/22/2014   Procedure: IRRIGATION AND DEBRIDEMENT OF RIGHT NECK ABCESS;  Surgeon: Jerrell Belfast, MD;  Location: [facility];  Service: ENT;  Laterality: Right;  . MULTIPLE EXTRACTIONS WITH ALVEOLOPLASTY N/A 01/18/2013   Procedure: MULTIPLE EXTRACION 3, 6, 7, 10, 11, 13, 21, 22, 27, 28, 29, 30 WITH ALVEOLOPLASTY;  Surgeon: Gae Bon, DDS;  Location: [facility];  Service: Oral Surgery;  Laterality: N/A;       Home Medications    Prior to Admission medications   Medication Sig Start Date End Date Taking? Authorizing Provider  abacavir-dolutegravir-lamiVUDine (TRIUMEQ) 600-50-300 MG tablet Take 1 tablet by mouth daily. 06/01/15   [name], MD  cephALEXin (KEFLEX) 500 MG capsule Take 1 capsule (500 mg total) by mouth 3 (three) times daily. 06/05/16   Verlee Monte, MD  dapsone 100 MG tablet TAKE 1 TABLET BY MOUTH EVERY DAY. NEED APPOINTMENT Patient not taking: Reported on 05/13/2016 09/19/14   [name], MD  darunavir-cobicistat (PREZCOBIX) 800-150 MG tablet Take 1 tablet by mouth daily. Take with food. 06/01/15   [name], MD  doxycycline (VIBRA-TABS) 100 MG tablet Take 1 tablet (100 mg total) by mouth 2 (two) times daily. 06/05/16   Verlee Monte, MD  ibuprofen (ADVIL,MOTRIN) 800 MG tablet  Take 1 tablet (800 mg total) by mouth every 8 (eight) hours as needed for mild pain. Patient not taking: Reported on 06/04/2016 03/04/16   Kristen N Ward, DO  imiquimod (ALDARA) 5 % cream Apply topically 3 (three) times a week. Patient not taking: Reported on 05/13/2016 01/30/15   [name], MD  Insulin Glargine (LANTUS SOLOSTAR) 100 UNIT/ML Solostar Pen Inject 60 Units into the skin daily at 10 pm. Patient taking differently: Inject 70 Units into the skin daily at 10 pm.  06/01/14   [name], MD  insulin lispro (HUMALOG) 100 UNIT/ML KiwkPen Inject 0.08 mLs (8 Units total) into the skin 3 (three) times daily. Patient taking differently: Inject 7 Units into the skin 3 (three) times daily.  02/21/14   [name], MD  LORazepam (ATIVAN) 1 MG tablet Take 1 tablet (1 mg total) by mouth every 6 (six) hours as needed for anxiety. Patient not taking: Reported on 06/04/2016 01/30/15   [name], MD  Oxycodone HCl 10 MG TABS Take 1 tablet (10 mg total) by mouth 3 (three) times daily. Take 1 tablet 3 times a day. Alternate with naproxen 500 mg every 4 to 6 hours 06/05/16   Verlee Monte, MD  oxyCODONE-acetaminophen (PERCOCET/ROXICET) 5-325 MG tablet Take 1-2 tablets by mouth every 6 (six) hours as needed for severe pain. 06/10/16 06/15/16  Lorayne Bender, PA-C    Family History Family History  Problem Relation Age of Onset  . Hypertension Mother   . Arthritis Father   . Hypertension Father   . Hypertension Brother   . Cancer Maternal Grandmother 32    unknown type of cancer  . Depression Paternal Grandmother     Social History Social History  Substance Use Topics  . Smoking status: Never Smoker  . Smokeless tobacco: Never Used  . Alcohol use No     Allergies   Sulfa antibiotics   Review of Systems Review of Systems  Constitutional: Positive for fever (subjective).  Gastrointestinal: Positive for nausea. Negative for vomiting.  Musculoskeletal: Positive for arthralgias.    Neurological: Negative for weakness and numbness.  All other systems reviewed and are negative.    Physical Exam Updated Vital Signs BP 129/90 (BP Location: Left Arm)   Pulse 92   Temp 97.8 F (36.6 C) (Oral)   Resp 18   Ht 5\' 10"  (1.778 m)   Wt 115.2 kg   SpO2 98%   BMI 36.45 kg/m   Physical Exam  Constitutional: He appears well-developed and well-nourished. No distress.  HENT:  Head: Normocephalic and atraumatic.  Eyes: Conjunctivae are normal.  Neck: Neck supple.  Cardiovascular: Normal rate, regular rhythm, normal heart sounds and intact distal pulses.   Pulmonary/Chest: Effort normal and breath sounds normal. No respiratory distress.  Abdominal: Soft. There is no tenderness. There is no guarding.  Musculoskeletal: He exhibits tenderness. He exhibits no  edema.  Patient has a bandage in place on the right small finger. It appears to be coban around gauze, however, the bandage is hard, like a cast, dirty, and readily slips off the finger.  Tenderness over the wound on the dorsum of right fifth digit. No swelling or spreading erythema noted. Motor function intact at DIP, PIP, and MCP joints. The wound tissue appears pink and healthy. Only serous drainage noted with no purulence.  Lymphadenopathy:    He has no cervical adenopathy.  Neurological: He is alert.  Sensation intact to distal tips of the fingers of the right hand. Lateral grip strength equal bilaterally. Medial grip strength lessened on the right, possibly due to pain.  Skin: Skin is warm and dry. He is not diaphoretic.  Psychiatric: He has a normal mood and affect. His behavior is normal.  Nursing note and vitals reviewed.          ED Treatments / Results  Labs (all labs ordered are listed, but only abnormal results are displayed) Labs Reviewed  BASIC METABOLIC PANEL - Abnormal; Notable for the following:       Result Value   Sodium 131 (*)    Chloride 95 (*)    Glucose, Bld 474 (*)    All other  components within normal limits  CBC WITH DIFFERENTIAL/PLATELET - Abnormal; Notable for the following:    Hemoglobin 12.6 (*)    HCT 36.9 (*)    All other components within normal limits    EKG  EKG Interpretation None       Radiology Dg Hand Complete Right  Result Date: 06/10/2016 CLINICAL DATA:  Soft tissue swelling in right little finger EXAM: RIGHT HAND - COMPLETE 3+ VIEW COMPARISON:  06/04/2016 FINDINGS: Improving soft tissue swelling in the right little finger since prior study. No fracture, subluxation, dislocation or radiopaque foreign body. Joint spaces are maintained. IMPRESSION: No acute bony abnormality. Decreasing right little finger soft tissue swelling. Electronically Signed   By: Rolm Baptise M.D.   On: 06/10/2016 11:58    Procedures Procedures (including critical care time)  Medications Ordered in ED Medications  oxyCODONE-acetaminophen (PERCOCET/ROXICET) 5-325 MG per tablet 2 tablet (2 tablets Oral Given 06/10/16 1411)     Initial Impression / Assessment and Plan / ED Course  I have reviewed the triage vital signs and the nursing notes.  Pertinent labs & imaging results that were available during my care of the patient were reviewed by me and considered in my medical decision making (see chart for details).     Patient presents with right small finger pain.   I suspect this patient can follow up with Dr. Grandville Silos in the office for his previously planned follow up. 1:23 PM Spoke with Dr. Grandville Silos, hand surgeon, who reviewed the pictures taken today and states that the patient can follow up with him in the office, as planned. Dr. Grandville Silos also reviewed the care instructions given to the patient prior to discharge. I was able to find these in the AVS the patient was given up on discharge on 2/28. I copied forward these exact instructions to his discharge papers for today.   1:37 PM Spoke with Felicia from Dr. Biagio Borg office to confirm the patient's appointment.  She states the patient does not have an appointment scheduled.  Patient was asked about this and at first insists he did make an appointment, but then states, "I guess I might have called the wrong doctor." I was able to scheduled an appointment  for the patient with Dr. Grandville Silos on March 13 at [facility]. This information was communicated with the patient. Wound care instructions were also explicitly communicated. Patient was shown how to properly clean the wound and reapply bandages. Return precautions discussed. Patient voices understanding of all instructions and is comfortable with discharge.   Findings and plan of care discussed with Virgel Manifold, MD.   Vitals:   06/10/16 1330 06/10/16 1345 06/10/16 1400 06/10/16 1450  BP: (!) 147/111 (!) 149/109 (!) 150/107 145/98  Pulse: 81 83 82 79  Resp:   18 17  Temp:      TempSrc:      SpO2: 99% 99% 100% 99%  Weight:      Height:         Final Clinical Impressions(s) / ED Diagnoses   Final diagnoses:  Finger pain, right    New Prescriptions New Prescriptions   OXYCODONE-ACETAMINOPHEN (PERCOCET/ROXICET) 5-325 MG TABLET    Take 1-2 tablets by mouth every 6 (six) hours as needed for severe pain.     Lorayne Bender, PA-C 06/10/16 1513    Virgel Manifold, MD 06/17/16 [phone]

## 2016-06-10 NOTE — ED Notes (Signed)
Pt back from X-ray.  

## 2016-06-19 DIAGNOSIS — M25641 Stiffness of right hand, not elsewhere classified: Secondary | ICD-10-CM | POA: Diagnosis not present

## 2016-08-01 ENCOUNTER — Other Ambulatory Visit: Payer: Self-pay | Admitting: Infectious Diseases

## 2016-08-01 DIAGNOSIS — B20 Human immunodeficiency virus [HIV] disease: Secondary | ICD-10-CM

## 2016-08-06 ENCOUNTER — Encounter (HOSPITAL_COMMUNITY): Payer: Self-pay | Admitting: Emergency Medicine

## 2016-08-06 ENCOUNTER — Emergency Department (HOSPITAL_COMMUNITY): Payer: Medicare Other

## 2016-08-06 ENCOUNTER — Emergency Department (HOSPITAL_COMMUNITY)
Admission: EM | Admit: 2016-08-06 | Discharge: 2016-08-06 | Disposition: A | Discharge status: Home / Self Care | Payer: Medicare Other | Attending: Emergency Medicine | Admitting: Emergency Medicine

## 2016-08-06 DIAGNOSIS — R51 Headache: Secondary | ICD-10-CM | POA: Diagnosis not present

## 2016-08-06 DIAGNOSIS — Z7982 Long term (current) use of aspirin: Secondary | ICD-10-CM | POA: Diagnosis not present

## 2016-08-06 DIAGNOSIS — S39012A Strain of muscle, fascia and tendon of lower back, initial encounter: Secondary | ICD-10-CM

## 2016-08-06 DIAGNOSIS — E119 Type 2 diabetes mellitus without complications: Secondary | ICD-10-CM | POA: Diagnosis not present

## 2016-08-06 DIAGNOSIS — Y999 Unspecified external cause status: Secondary | ICD-10-CM | POA: Insufficient documentation

## 2016-08-06 DIAGNOSIS — Y929 Unspecified place or not applicable: Secondary | ICD-10-CM | POA: Diagnosis not present

## 2016-08-06 DIAGNOSIS — S3992XA Unspecified injury of lower back, initial encounter: Secondary | ICD-10-CM | POA: Diagnosis present

## 2016-08-06 DIAGNOSIS — R55 Syncope and collapse: Secondary | ICD-10-CM | POA: Diagnosis not present

## 2016-08-06 DIAGNOSIS — Y939 Activity, unspecified: Secondary | ICD-10-CM | POA: Diagnosis not present

## 2016-08-06 DIAGNOSIS — Z79899 Other long term (current) drug therapy: Secondary | ICD-10-CM | POA: Diagnosis not present

## 2016-08-06 DIAGNOSIS — M25561 Pain in right knee: Secondary | ICD-10-CM | POA: Insufficient documentation

## 2016-08-06 DIAGNOSIS — W19XXXA Unspecified fall, initial encounter: Secondary | ICD-10-CM

## 2016-08-06 DIAGNOSIS — W1830XA Fall on same level, unspecified, initial encounter: Secondary | ICD-10-CM | POA: Insufficient documentation

## 2016-08-06 DIAGNOSIS — I10 Essential (primary) hypertension: Secondary | ICD-10-CM | POA: Diagnosis not present

## 2016-08-06 DIAGNOSIS — S8991XA Unspecified injury of right lower leg, initial encounter: Secondary | ICD-10-CM | POA: Diagnosis not present

## 2016-08-06 DIAGNOSIS — M545 Low back pain: Secondary | ICD-10-CM | POA: Diagnosis not present

## 2016-08-06 LAB — BASIC METABOLIC PANEL
Anion gap: 9 (ref 5–15)
BUN: 19 mg/dL (ref 6–20)
CO2: 26 mmol/L (ref 22–32)
Calcium: 9.5 mg/dL (ref 8.9–10.3)
Chloride: 96 mmol/L — ABNORMAL LOW (ref 101–111)
Creatinine, Ser: 1.06 mg/dL (ref 0.61–1.24)
GFR calc Af Amer: >60 mL/min (ref >60)
GFR calc non Af Amer: >60 mL/min (ref >60)
Glucose, Bld: 399 mg/dL — ABNORMAL HIGH (ref 65–99)
Potassium: 3.9 mmol/L (ref 3.5–5.1)
Sodium: 131 mmol/L — ABNORMAL LOW (ref 135–145)

## 2016-08-06 LAB — I-STAT TROPONIN, ED: Troponin i, poc: 0.01 ng/mL (ref 0.00–0.08)

## 2016-08-06 LAB — CBC
HCT: 37.2 % — ABNORMAL LOW (ref 39.0–52.0)
Hemoglobin: 12.4 g/dL — ABNORMAL LOW (ref 13.0–17.0)
MCH: 26.5 pg (ref 26.0–34.0)
MCHC: 33.3 g/dL (ref 30.0–36.0)
MCV: 79.5 fL (ref 78.0–100.0)
Platelets: 184 10*3/uL (ref 150–400)
RBC: 4.68 MIL/uL (ref 4.22–5.81)
RDW: 12.5 % (ref 11.5–15.5)
WBC: 7.4 10*3/uL (ref 4.0–10.5)

## 2016-08-06 LAB — CBG MONITORING, ED: Glucose-Capillary: 325 mg/dL — ABNORMAL HIGH (ref 65–99)

## 2016-08-06 MED ORDER — METHOCARBAMOL 500 MG PO TABS: 500.0000 mg | ORAL_TABLET | Freq: Two times a day (BID) | ORAL | 0 refills | Status: DC

## 2016-08-06 MED ORDER — FENTANYL CITRATE (PF) 100 MCG/2ML IJ SOLN
50.0000 ug | Freq: Once | INTRAMUSCULAR | Status: AC
  Administered 2016-08-06: 50 ug via INTRAVENOUS
  Filled 2016-08-06: qty 2

## 2016-08-06 MED ORDER — SODIUM CHLORIDE 0.9 % IV BOLUS (SEPSIS)
1000.0000 mL | Freq: Once | INTRAVENOUS | Status: AC
  Administered 2016-08-06: 1000 mL via INTRAVENOUS

## 2016-08-06 MED ORDER — ACETAMINOPHEN 500 MG PO TABS: 1000.0000 mg | ORAL_TABLET | Freq: Three times a day (TID) | ORAL | 0 refills | Status: AC

## 2016-08-06 MED ORDER — ACETAMINOPHEN 500 MG PO TABS
1000.0000 mg | ORAL_TABLET | Freq: Once | ORAL | Status: AC
  Administered 2016-08-06: 1000 mg via ORAL
  Filled 2016-08-06: qty 2

## 2016-08-06 NOTE — ED Notes (Signed)
Patient transported to X-ray 

## 2016-08-06 NOTE — ED Provider Notes (Addendum)
[facility] DEPT Provider Note   CSN: [id number] Arrival date & time: 08/06/16  1306     History   Chief Complaint Chief Complaint  Patient presents with  . Loss of Consciousness    HPI Keith [name] is a 51 y.o. male.   Loss of Consciousness   This is a new problem. The current episode started 3 to 5 hours ago. Episode frequency: once. The problem has been resolved. Length of episode of loss of consciousness: unknown. The problem is associated with normal activity. Associated symptoms include headaches (just before passing out. now completely resolve), light-headedness (prior to syncope) and malaise/fatigue. Pertinent negatives include abdominal pain, bladder incontinence, chest pain, confusion, dizziness, fever, focal weakness, nausea, palpitations and vomiting. He has tried nothing for the symptoms.   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 Then be bent over and stood back up. Next think he remembers is waking up on the floor.   Past Medical History:  Diagnosis Date  . AIDS ([place])   . Chronic knee pain    right  . Chronic pain   . Diabetes mellitus   . Diabetes type 2, uncontrolled ([place])    HgA1c 17.6 (04/27/2010)  . Erectile dysfunction   . Genital warts   . HIV (human immunodeficiency virus infection) ([place]) 2009   CD4 count 100, VL 13800 (05/01/2010)  . Hypertension   . Osteomyelitis [facility])    h/o hand    Patient Active Problem List   Diagnosis Date Noted  . Cellulitis and abscess of hand 06/04/2016  . Cellulitis of left hand 06/04/2016  . Onychomycosis of multiple toenails with type 2 diabetes mellitus ([place]) 08/29/2015  . MRSA carrier 04/20/2014  . Testosterone deficiency 04/20/2014  . Genital warts 02/22/2014  . HIV disease ([place])   . Hyperglycemia due to type 2 diabetes mellitus ([place]) 02/04/2014  . Dental anomaly 11/20/2012  . Arthritis of right knee 02/23/2012  . Hyperlipidemia 11/10/2011  . Chronic pain 08/07/2011  .  Meralgia paraesthetica 04/23/2011  . ERECTILE DYSFUNCTION 08/22/2008  . Essential hypertension 05/19/2008    Past Surgical History:  Procedure Laterality Date  . HERNIA REPAIR    . I&D EXTREMITY Left 08/21/2014   Procedure: INCISION AND DRAINAGE LEFT SMALL FINGER;  Surgeon: Leanora Cover, MD;  Location: [facility];  Service: Orthopedics;  Laterality: Left;  . MINOR IRRIGATION AND DEBRIDEMENT OF WOUND Right 04/22/2014   Procedure: IRRIGATION AND DEBRIDEMENT OF RIGHT NECK ABCESS;  Surgeon: Jerrell Belfast, MD;  Location: [facility];  Service: ENT;  Laterality: Right;  . MULTIPLE EXTRACTIONS WITH ALVEOLOPLASTY N/A 01/18/2013   Procedure: MULTIPLE EXTRACION 3, 6, 7, 10, 11, 13, 21, 22, 27, 28, 29, 30 WITH ALVEOLOPLASTY;  Surgeon: Gae Bon, DDS;  Location: [facility];  Service: Oral Surgery;  Laterality: N/A;       Home Medications    Prior to Admission medications   Medication Sig Start Date End Date Taking? Authorizing Provider  abacavir-dolutegravir-lamiVUDine (TRIUMEQ) 600-50-300 MG tablet Take 1 tablet by mouth daily. 06/01/15   [name], MD  acetaminophen (TYLENOL) 500 MG tablet Take 2 tablets (1,000 mg total) by mouth every 8 (eight) hours. Do not take more than 4000 mg of acetaminophen (Tylenol) in a 24-hour period. Please note that other medicines that you may be prescribed may have Tylenol as well. 08/06/16 08/11/16  Fatima Blank, MD  cephALEXin (KEFLEX) 500 MG capsule Take 1 capsule (500 mg total) by mouth 3 (three)  times daily. 06/05/16   Verlee Monte, MD  dapsone 100 MG tablet TAKE 1 TABLET BY MOUTH EVERY DAY. NEED APPOINTMENT Patient not taking: Reported on 05/13/2016 09/19/14   [name], MD  darunavir-cobicistat (PREZCOBIX) 800-150 MG tablet Take 1 tablet by mouth daily. Take with food. 06/01/15   [name], MD  doxycycline (VIBRA-TABS) 100 MG tablet Take 1 tablet (100 mg total) by mouth 2 (two) times daily. 06/05/16   Verlee Monte, MD  GENVOYA 150-150-200-10 MG  TABS tablet TAKE 1 TABLET BY MOUTH DAILY WITH BREAKFAST 08/02/16   [name], MD  ibuprofen (ADVIL,MOTRIN) 800 MG tablet Take 1 tablet (800 mg total) by mouth every 8 (eight) hours as needed for mild pain. Patient not taking: Reported on 06/04/2016 03/04/16   Kristen N Ward, DO  imiquimod (ALDARA) 5 % cream Apply topically 3 (three) times a week. Patient not taking: Reported on 05/13/2016 01/30/15   [name], MD  Insulin Glargine (LANTUS SOLOSTAR) 100 UNIT/ML Solostar Pen Inject 60 Units into the skin daily at 10 pm. Patient taking differently: Inject 70 Units into the skin daily at 10 pm.  06/01/14   [name], MD  insulin lispro (HUMALOG) 100 UNIT/ML KiwkPen Inject 0.08 mLs (8 Units total) into the skin 3 (three) times daily. Patient taking differently: Inject 7 Units into the skin 3 (three) times daily.  02/21/14   [name], MD  LORazepam (ATIVAN) 1 MG tablet Take 1 tablet (1 mg total) by mouth every 6 (six) hours as needed for anxiety. Patient not taking: Reported on 06/04/2016 01/30/15   [name], MD  methocarbamol (ROBAXIN) 500 MG tablet Take 1 tablet (500 mg total) by mouth 2 (two) times daily. 08/06/16   Fatima Blank, MD  Oxycodone HCl 10 MG TABS Take 1 tablet (10 mg total) by mouth 3 (three) times daily. Take 1 tablet 3 times a day. Alternate with naproxen 500 mg every 4 to 6 hours 06/05/16   Verlee Monte, MD    Family History Family History  Problem Relation Age of Onset  . Hypertension Mother   . Arthritis Father   . Hypertension Father   . Hypertension Brother   . Cancer Maternal Grandmother 80    unknown type of cancer  . Depression Paternal Grandmother     Social History Social History  Substance Use Topics  . Smoking status: Never Smoker  . Smokeless tobacco: Never Used  . Alcohol use No     Allergies   Sulfa antibiotics   Review of Systems Review of Systems  Constitutional: Positive for malaise/fatigue. Negative for fever.   Cardiovascular: Positive for syncope. Negative for chest pain and palpitations.  Gastrointestinal: Negative for abdominal pain, nausea and vomiting.  Genitourinary: Negative for bladder incontinence.  Neurological: Positive for light-headedness (prior to syncope) and headaches (just before passing out. now completely resolve). Negative for dizziness and focal weakness.  Psychiatric/Behavioral: Negative for confusion.  All other systems are reviewed and are negative for acute change except as noted in the HPI    Physical Exam Updated Vital Signs BP (!) 142/102 (BP Location: Right Arm)   Pulse 88   Temp 98 F (36.7 C) (Oral)   Resp 16   SpO2 99%   Physical Exam  Constitutional: He is oriented to person, place, and time. He appears well-developed and well-nourished. No distress.  HENT:  Head: Normocephalic.  Right Ear: External ear normal.  Left Ear: External ear normal.  Mouth/Throat:  Oropharynx is clear and moist.  Eyes: Conjunctivae and EOM are normal. Pupils are equal, round, and reactive to light. Right eye exhibits no discharge. Left eye exhibits no discharge. No scleral icterus.  Neck: Normal range of motion. Neck supple.  Cardiovascular: Regular rhythm and normal heart sounds.  Exam reveals no gallop and no friction rub.   No murmur heard. Pulses:      Radial pulses are 2+ on the right side, and 2+ on the left side.       Dorsalis pedis pulses are 2+ on the right side, and 2+ on the left side.  Pulmonary/Chest: Effort normal and breath sounds normal. No stridor. No respiratory distress.  Abdominal: Soft. He exhibits no distension. There is no tenderness.  Musculoskeletal:       Right knee: He exhibits no deformity. Tenderness found.       Cervical back: He exhibits no bony tenderness.       Thoracic back: He exhibits no bony tenderness.       Lumbar back: He exhibits tenderness. He exhibits no bony tenderness.       Back:  Clavicle stable. Chest stable to AP/Lat  compression. Pelvis stable to Lat compression. No obvious extremity deformity. No chest or abdominal wall contusion.  Neurological: He is alert and oriented to person, place, and time. GCS eye subscore is 4. GCS verbal subscore is 5. GCS motor subscore is 6.  Moving all extremities   Skin: Skin is warm. He is not diaphoretic.     ED Treatments / Results  Labs (all labs ordered are listed, but only abnormal results are displayed) Labs Reviewed  BASIC METABOLIC PANEL - Abnormal; Notable for the following:       Result Value   Sodium 131 (*)    Chloride 96 (*)    Glucose, Bld 399 (*)    All other components within normal limits  CBC - Abnormal; Notable for the following:    Hemoglobin 12.4 (*)    HCT 37.2 (*)    All other components within normal limits  CBG MONITORING, ED - Abnormal; Notable for the following:    Glucose-Capillary 325 (*)    All other components within normal limits  I-STAT TROPOININ, ED  CBG MONITORING, ED  I-STAT TROPOININ, ED    EKG  EKG Interpretation  Date/Time:  Tuesday Aug 06 2016 13:16:43 EDT Ventricular Rate:  92 PR Interval:  158 QRS Duration: 110 QT Interval:  366 QTC Calculation: 452 R Axis:   8 Text Interpretation:  Normal sinus rhythm Normal ECG No significant change since last tracing Confirmed by [facility] MD, Taisa Deloria ([id number]) on 08/06/2016 3:11:25 PM       Radiology Dg Lumbar Spine 2-3 Views  Result Date: 08/06/2016 CLINICAL DATA:  Chronic low back pain.  Fall EXAM: LUMBAR SPINE - 2-3 VIEW COMPARISON:  None. FINDINGS: Normal alignment. Negative for fracture or pars defect. Mild disc degeneration L1-2, L2-3, L3-4. IMPRESSION: Negative for fracture.  Mild lumbar disc degeneration Electronically Signed   By: Franchot Gallo M.D.   On: 08/06/2016 16:03   Ct Head Wo Contrast  Result Date: 08/06/2016 CLINICAL DATA:  Syncope, headache. EXAM: CT HEAD WITHOUT CONTRAST TECHNIQUE: Contiguous axial images were obtained from the base of the skull  through the vertex without intravenous contrast. COMPARISON:  CT scan of September 15, 2015. FINDINGS: Brain: No evidence of acute infarction, hemorrhage, hydrocephalus, extra-axial collection or mass lesion/mass effect. Vascular: No hyperdense vessel or unexpected calcification. Skull: Normal. Negative for  fracture or focal lesion. Sinuses/Orbits: Small mucous retention cyst is noted in left maxillary sinus. Other: None. IMPRESSION: No acute intracranial abnormality seen. Electronically Signed   By: Marijo Conception, M.D.   On: 08/06/2016 16:23   Dg Knee Complete 4 Views Right  Result Date: 08/06/2016 CLINICAL DATA:  Fall today.  History of chronic knee pain. EXAM: RIGHT KNEE - COMPLETE 4+ VIEW COMPARISON:  03/03/2016. FINDINGS: Mild tricompartment degenerative change. No acute bony or joint abnormality identified. No prominent fusion. IMPRESSION: Mild tricompartment degenerative change.  No acute abnormality. Electronically Signed   By: Marcello Moores  Register   On: 08/06/2016 16:07    Procedures Procedures (including critical care time)  Medications Ordered in ED Medications  sodium chloride 0.9 % bolus 1,000 mL (1,000 mLs Intravenous New Bag/Given 08/06/16 1525)  acetaminophen (TYLENOL) tablet 1,000 mg (1,000 mg Oral Given 08/06/16 1523)  fentaNYL (SUBLIMAZE) injection 50 mcg (50 mcg Intravenous Given 08/06/16 1523)     Initial Impression / Assessment and Plan / ED Course  I have reviewed the triage vital signs and the nursing notes.  Pertinent labs & imaging results that were available during my care of the patient were reviewed by me and considered in my medical decision making (see chart for details).     Given the patient's headache prior to the syncopal episode a CT of the head was obtained to rule out subarachnoid hemorrhage. Patient is within the 6 hour window, allowing [place] to rule him out for subarachnoid hemorrhage with CT head alone.  CT head negative.  EKG without acute ischemic changes,  dysrhythmias, blocks. We'll obtain a troponin to assess for any cardiac injury that might have resulted in his syncopal episode.  Screening labs obtained revealed no evidence of significant electrolyte derangements. Patient did have hyperglycemia without anion gap to suggest DKA. We'll provide the patient with IV fluids and rechecked the CBG.  CBC without anemia.  Patient's syncopal episode resulted in worsening of his chronic right knee and lower back pain. Plain films did not reveal any evidence of acute injury.   Troponin at five-hour mark following his episode was negative.  Final Clinical Impressions(s) / ED Diagnoses   Final diagnoses:  Fall  Syncope and collapse  Acute pain of right knee  Strain of lumbar region, initial encounter        Fatima Blank, MD 08/06/16 1744

## 2016-08-06 NOTE — ED Triage Notes (Signed)
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 Pt states he is having lower back pain, knee pain and a headache.

## 2016-08-11 IMAGING — CT CT HEAD W/O CM
3 of 4 series · 17 of 47 positions shown, 20 images · non-contrast
Comparison: Head CT dated 06/12/2012

CLINICAL DATA: 49-year-old male with syncope and slurred speech

EXAM:
CT HEAD WITHOUT CONTRAST
TECHNIQUE: Contiguous axial images were obtained from the base of the skull
through the vertex without intravenous contrast.

[Series 201: head w/o, idose (1) · axial · non-contrast · 0.45mm/px · z∈[+109,+249]mm · 11 of 34 slices shown, 14 images]
[im 3/34  brain]
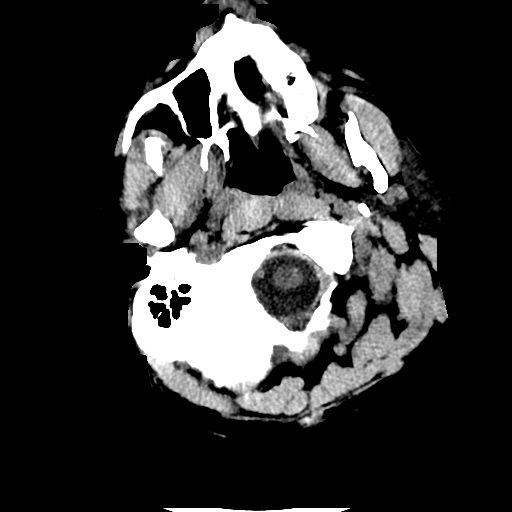
[im 3/34  bone]
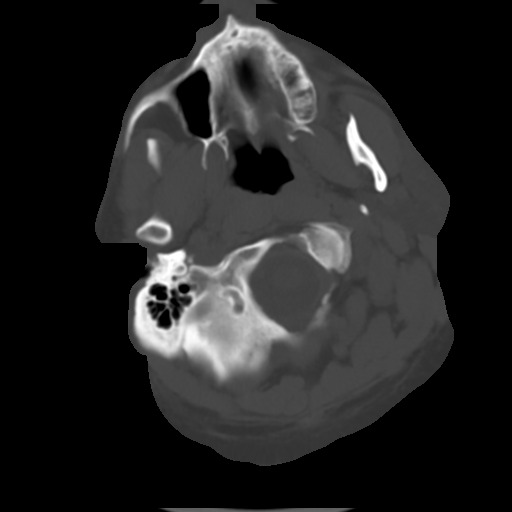
[im 5/34  brain]
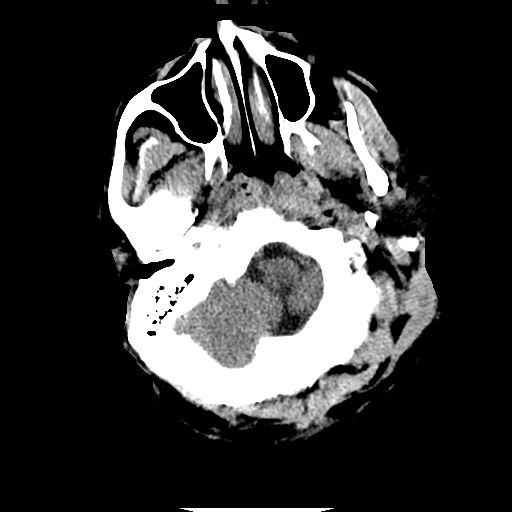
[im 8/34  brain]
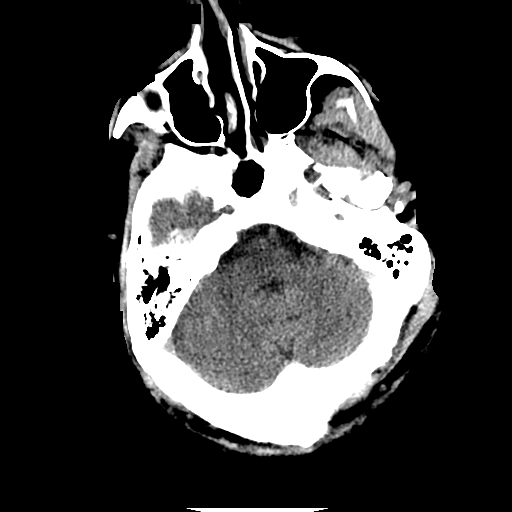
[im 12/34  brain]
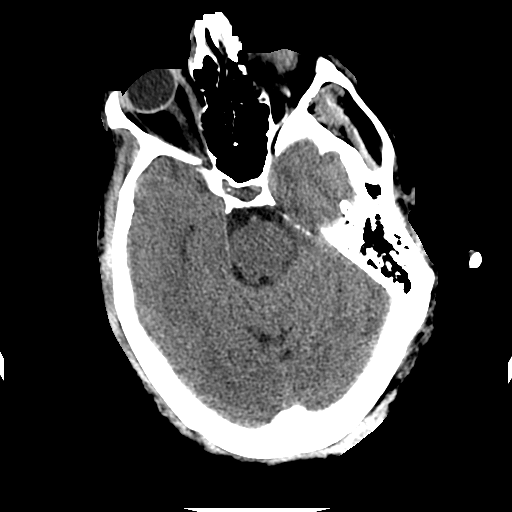
[im 15/34  brain]
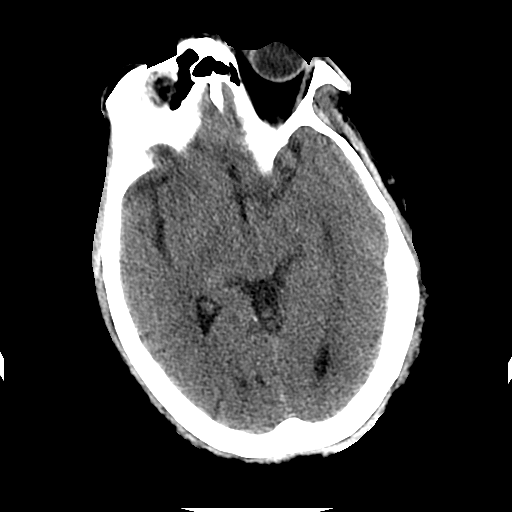
[im 15/34  bone]
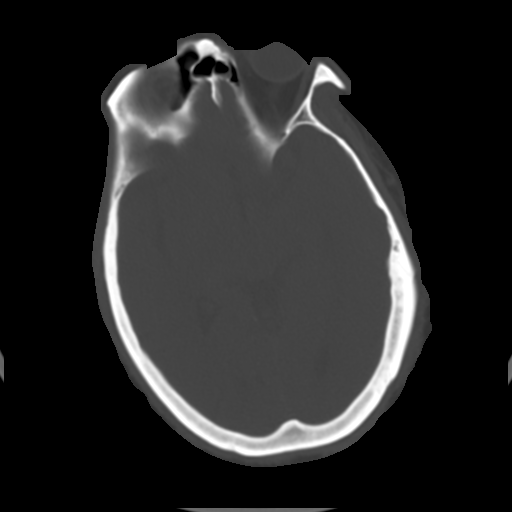
[im 17/34  brain]
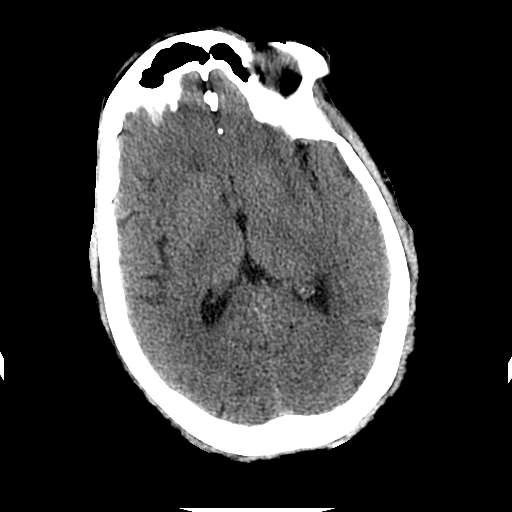
[im 19/34  brain]
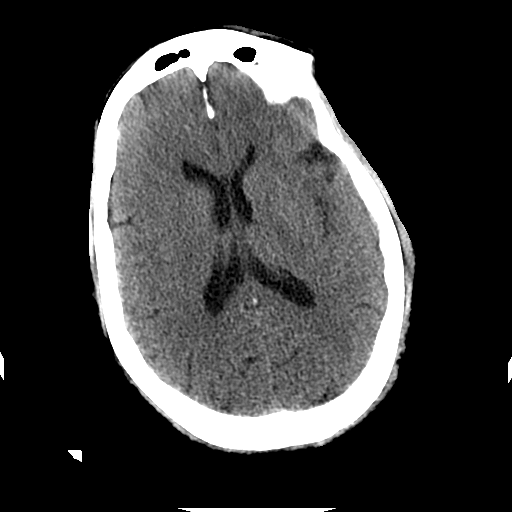
[im 22/34  brain]
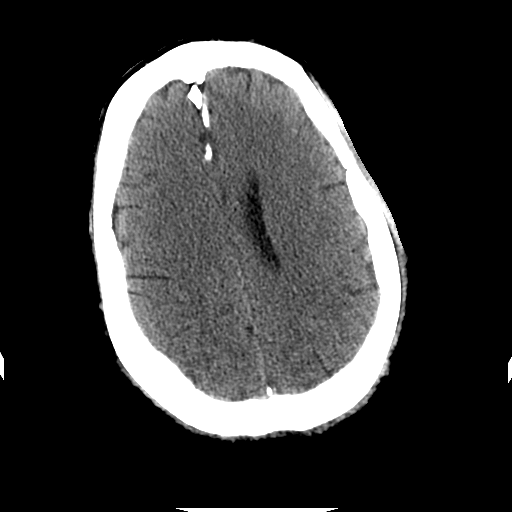
[im 26/34  brain]
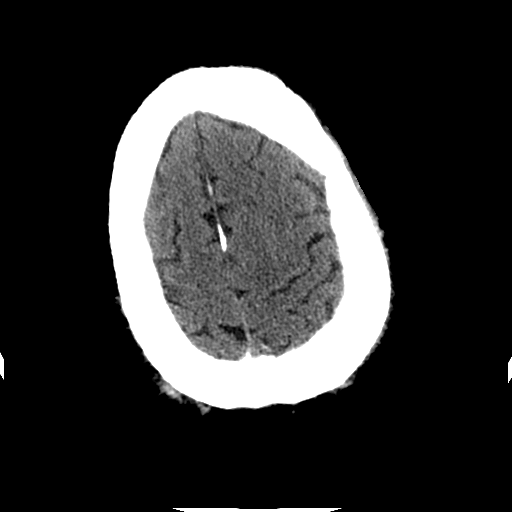
[im 26/34  bone]
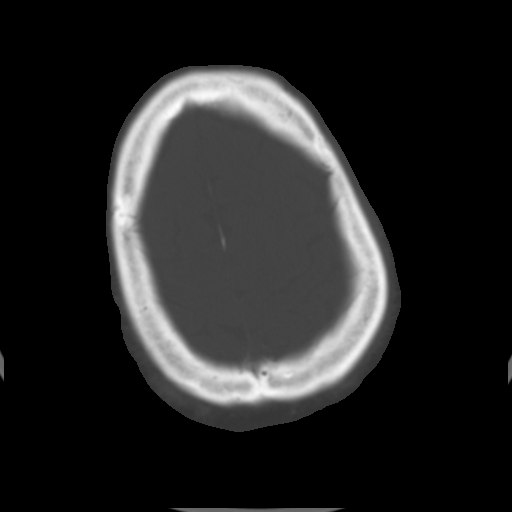
[im 29/34  brain]
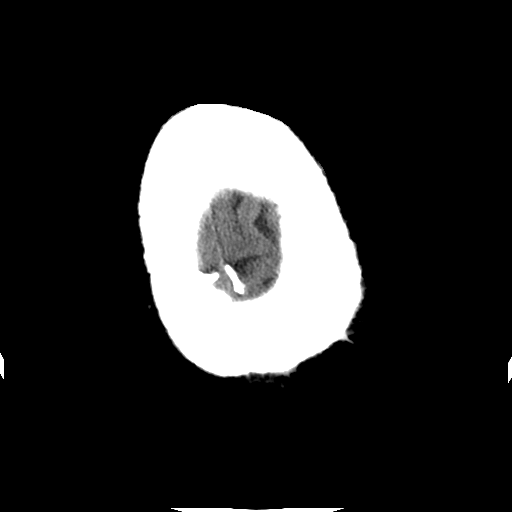
[im 31/34  brain]
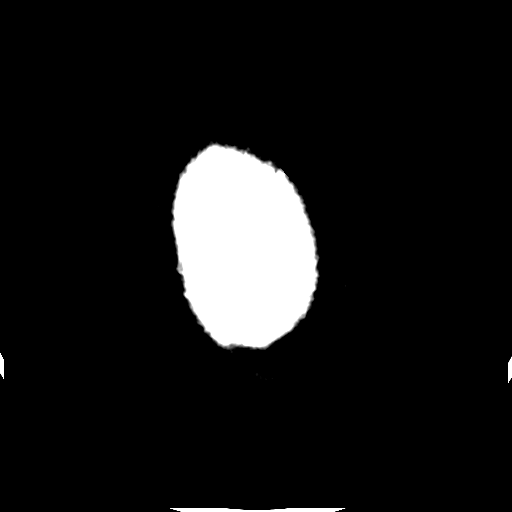

[Series 203: coronal st, idose (1) · coronal · 0.40mm/px · 3 of 76 slices shown]
[im 26/76  brain]
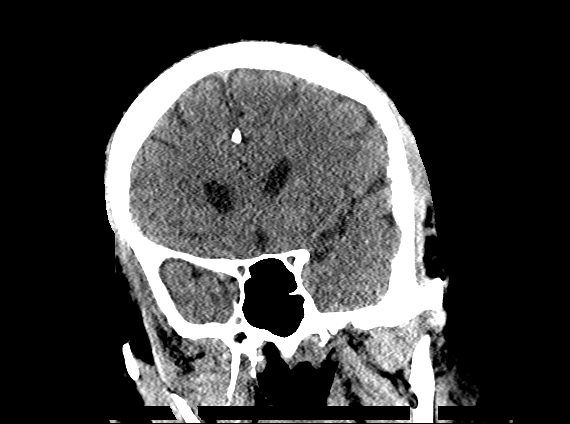
[im 34/76  brain]
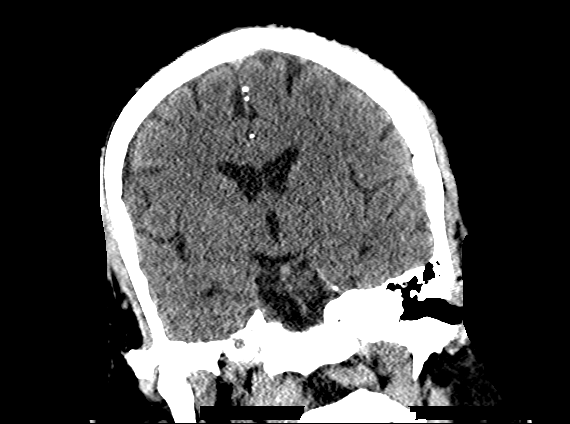
[im 42/76  brain]
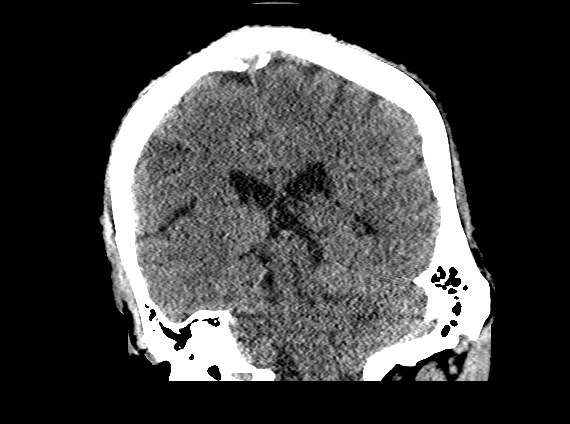

[Series 204: sagittal st, idose (1) · sagittal · 0.40mm/px · 3 of 76 slices shown]
[im 26/76  brain]
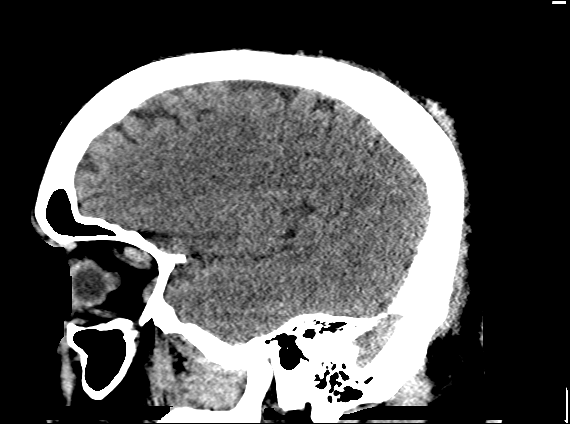
[im 38/76  brain]
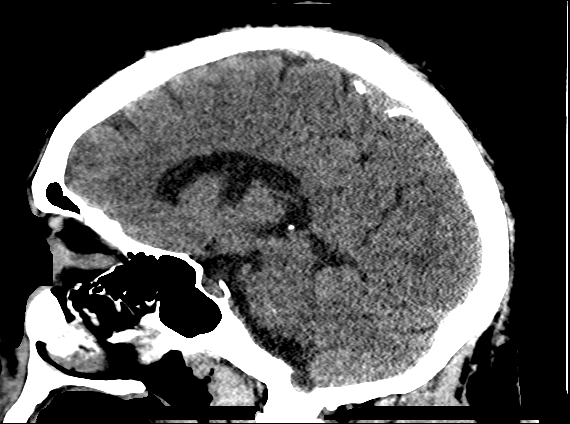
[im 51/76  brain]
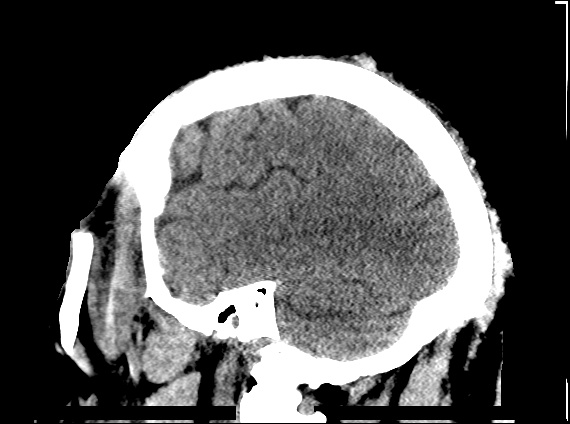

[17 of 47 positions shown; findings below may reference images not displayed]

FINDINGS: The ventricles and sulci are appropriate in size for patient's age.
There is no acute intracranial hemorrhage. No mass effect or midline
shift noted. The visualized paranasal sinuses and mastoid air cells
are clear. The calvarium is intact.
IMPRESSION: No acute intracranial pathology.

## 2016-09-10 ENCOUNTER — Ambulatory Visit: Payer: Medicare Other | Admitting: Infectious Diseases

## 2016-09-16 ENCOUNTER — Other Ambulatory Visit: Payer: Self-pay | Admitting: *Deleted

## 2016-09-16 ENCOUNTER — Other Ambulatory Visit: Payer: Self-pay | Admitting: Infectious Diseases

## 2016-09-16 ENCOUNTER — Telehealth: Payer: Self-pay | Admitting: *Deleted

## 2016-09-16 DIAGNOSIS — B20 Human immunodeficiency virus [HIV] disease: Secondary | ICD-10-CM

## 2016-09-16 MED ORDER — DARUNAVIR-COBICISTAT 800-150 MG PO TABS: 1.0000 | ORAL_TABLET | Freq: Every day | ORAL | 3 refills | Status: DC

## 2016-09-16 MED ORDER — ABACAVIR-DOLUTEGRAVIR-LAMIVUD 600-50-300 MG PO TABS: ORAL_TABLET | ORAL | 3 refills | Status: DC

## 2016-09-16 NOTE — Telephone Encounter (Addendum)
Patient walked into clinic c/o left foot pain with tingling. He said the pain travels up his left leg. I looked at his foot and noticed a callous. Advised patient to not rub or pick at the dry calloused area. Called his PCP and he actually is scheduled for an appt on 09/24/16 at 8:15 AM. Advised patient to keep this appt. He said he is dissatisfied with his PCP and asked to be referred somewhere else. Asked what he does not like about this provider and he said they do not check him out, like his previous MD had. I offered to refer him to [place] and he is ok with that. He was scheduled an appt for 09/24/16 at 10:30 AM. Patient given appt time, date, and address. Myrtis Hopping CMA

## 2016-09-24 ENCOUNTER — Ambulatory Visit: Payer: Medicare Other | Admitting: Family Medicine

## 2016-09-27 ENCOUNTER — Encounter (HOSPITAL_COMMUNITY): Payer: Self-pay

## 2016-09-27 ENCOUNTER — Inpatient Hospital Stay (HOSPITAL_COMMUNITY)
Admission: EM | Admit: 2016-09-27 | Discharge: 2016-09-29 | DRG: 977 | Disposition: A | Discharge status: Home / Self Care | Payer: Medicare Other | Attending: Family Medicine | Admitting: Family Medicine

## 2016-09-27 DIAGNOSIS — Z79891 Long term (current) use of opiate analgesic: Secondary | ICD-10-CM

## 2016-09-27 DIAGNOSIS — G8929 Other chronic pain: Secondary | ICD-10-CM | POA: Diagnosis not present

## 2016-09-27 DIAGNOSIS — Z882 Allergy status to sulfonamides status: Secondary | ICD-10-CM

## 2016-09-27 DIAGNOSIS — N179 Acute kidney failure, unspecified: Secondary | ICD-10-CM

## 2016-09-27 DIAGNOSIS — E860 Dehydration: Secondary | ICD-10-CM | POA: Diagnosis not present

## 2016-09-27 DIAGNOSIS — E869 Volume depletion, unspecified: Secondary | ICD-10-CM | POA: Diagnosis not present

## 2016-09-27 DIAGNOSIS — I493 Ventricular premature depolarization: Secondary | ICD-10-CM | POA: Diagnosis present

## 2016-09-27 DIAGNOSIS — Z79899 Other long term (current) drug therapy: Secondary | ICD-10-CM

## 2016-09-27 DIAGNOSIS — Z794 Long term (current) use of insulin: Secondary | ICD-10-CM

## 2016-09-27 DIAGNOSIS — I10 Essential (primary) hypertension: Secondary | ICD-10-CM | POA: Diagnosis not present

## 2016-09-27 DIAGNOSIS — E119 Type 2 diabetes mellitus without complications: Secondary | ICD-10-CM

## 2016-09-27 DIAGNOSIS — D649 Anemia, unspecified: Secondary | ICD-10-CM | POA: Diagnosis present

## 2016-09-27 DIAGNOSIS — E1159 Type 2 diabetes mellitus with other circulatory complications: Secondary | ICD-10-CM | POA: Diagnosis present

## 2016-09-27 DIAGNOSIS — B20 Human immunodeficiency virus [HIV] disease: Secondary | ICD-10-CM | POA: Diagnosis not present

## 2016-09-27 DIAGNOSIS — Z8614 Personal history of Methicillin resistant Staphylococcus aureus infection: Secondary | ICD-10-CM

## 2016-09-27 DIAGNOSIS — R197 Diarrhea, unspecified: Secondary | ICD-10-CM | POA: Diagnosis not present

## 2016-09-27 DIAGNOSIS — R112 Nausea with vomiting, unspecified: Secondary | ICD-10-CM

## 2016-09-27 DIAGNOSIS — A630 Anogenital (venereal) warts: Secondary | ICD-10-CM | POA: Diagnosis present

## 2016-09-27 DIAGNOSIS — M25561 Pain in right knee: Secondary | ICD-10-CM

## 2016-09-27 DIAGNOSIS — W19XXXA Unspecified fall, initial encounter: Secondary | ICD-10-CM | POA: Diagnosis present

## 2016-09-27 DIAGNOSIS — R55 Syncope and collapse: Secondary | ICD-10-CM

## 2016-09-27 LAB — LIPASE, BLOOD: Lipase: 22 U/L (ref 11–51)

## 2016-09-27 LAB — COMPREHENSIVE METABOLIC PANEL
ALT: 12 U/L — ABNORMAL LOW (ref 17–63)
AST: 19 U/L (ref 15–41)
Albumin: 4 g/dL (ref 3.5–5.0)
Alkaline Phosphatase: 47 U/L (ref 38–126)
Anion gap: 8 (ref 5–15)
BUN: 21 mg/dL — ABNORMAL HIGH (ref 6–20)
CO2: 22 mmol/L (ref 22–32)
Calcium: 9.5 mg/dL (ref 8.9–10.3)
Chloride: 107 mmol/L (ref 101–111)
Creatinine, Ser: 2.02 mg/dL — ABNORMAL HIGH (ref 0.61–1.24)
GFR calc Af Amer: 42 mL/min — ABNORMAL LOW (ref >60)
GFR calc non Af Amer: 36 mL/min — ABNORMAL LOW (ref >60)
Glucose, Bld: 128 mg/dL — ABNORMAL HIGH (ref 65–99)
Potassium: 3.4 mmol/L — ABNORMAL LOW (ref 3.5–5.1)
Sodium: 137 mmol/L (ref 135–145)
Total Bilirubin: 0.5 mg/dL (ref 0.3–1.2)
Total Protein: 7.8 g/dL (ref 6.5–8.1)

## 2016-09-27 LAB — CBC
HCT: 37.6 % — ABNORMAL LOW (ref 39.0–52.0)
Hemoglobin: 12.3 g/dL — ABNORMAL LOW (ref 13.0–17.0)
MCH: 26.9 pg (ref 26.0–34.0)
MCHC: 32.7 g/dL (ref 30.0–36.0)
MCV: 82.3 fL (ref 78.0–100.0)
Platelets: 260 10*3/uL (ref 150–400)
RBC: 4.57 MIL/uL (ref 4.22–5.81)
RDW: 14.7 % (ref 11.5–15.5)
WBC: 8.5 10*3/uL (ref 4.0–10.5)

## 2016-09-27 NOTE — ED Triage Notes (Signed)
Pt complaining of diarrhea x 1 week. Pt denies any abdominal pain or nausea/vomiting. Pt denies urinary symptoms. Pt states 5 episodes of diarrhea.

## 2016-09-28 ENCOUNTER — Observation Stay (HOSPITAL_COMMUNITY): Payer: Medicare Other

## 2016-09-28 ENCOUNTER — Encounter (HOSPITAL_COMMUNITY): Payer: Self-pay | Admitting: Family Medicine

## 2016-09-28 DIAGNOSIS — E869 Volume depletion, unspecified: Secondary | ICD-10-CM | POA: Diagnosis present

## 2016-09-28 DIAGNOSIS — Z882 Allergy status to sulfonamides status: Secondary | ICD-10-CM | POA: Diagnosis not present

## 2016-09-28 DIAGNOSIS — G8929 Other chronic pain: Secondary | ICD-10-CM | POA: Diagnosis not present

## 2016-09-28 DIAGNOSIS — B20 Human immunodeficiency virus [HIV] disease: Principal | ICD-10-CM

## 2016-09-28 DIAGNOSIS — Z79899 Other long term (current) drug therapy: Secondary | ICD-10-CM | POA: Diagnosis not present

## 2016-09-28 DIAGNOSIS — E119 Type 2 diabetes mellitus without complications: Secondary | ICD-10-CM

## 2016-09-28 DIAGNOSIS — R112 Nausea with vomiting, unspecified: Secondary | ICD-10-CM | POA: Diagnosis not present

## 2016-09-28 DIAGNOSIS — E1159 Type 2 diabetes mellitus with other circulatory complications: Secondary | ICD-10-CM | POA: Diagnosis present

## 2016-09-28 DIAGNOSIS — R197 Diarrhea, unspecified: Secondary | ICD-10-CM | POA: Diagnosis not present

## 2016-09-28 DIAGNOSIS — W19XXXA Unspecified fall, initial encounter: Secondary | ICD-10-CM | POA: Diagnosis present

## 2016-09-28 DIAGNOSIS — Z794 Long term (current) use of insulin: Secondary | ICD-10-CM

## 2016-09-28 DIAGNOSIS — Z8614 Personal history of Methicillin resistant Staphylococcus aureus infection: Secondary | ICD-10-CM | POA: Diagnosis not present

## 2016-09-28 DIAGNOSIS — N179 Acute kidney failure, unspecified: Secondary | ICD-10-CM | POA: Diagnosis present

## 2016-09-28 DIAGNOSIS — A630 Anogenital (venereal) warts: Secondary | ICD-10-CM | POA: Diagnosis present

## 2016-09-28 DIAGNOSIS — I10 Essential (primary) hypertension: Secondary | ICD-10-CM

## 2016-09-28 DIAGNOSIS — M25561 Pain in right knee: Secondary | ICD-10-CM | POA: Diagnosis present

## 2016-09-28 DIAGNOSIS — Z79891 Long term (current) use of opiate analgesic: Secondary | ICD-10-CM | POA: Diagnosis not present

## 2016-09-28 DIAGNOSIS — E860 Dehydration: Secondary | ICD-10-CM | POA: Diagnosis present

## 2016-09-28 DIAGNOSIS — D649 Anemia, unspecified: Secondary | ICD-10-CM | POA: Diagnosis present

## 2016-09-28 DIAGNOSIS — I493 Ventricular premature depolarization: Secondary | ICD-10-CM | POA: Diagnosis present

## 2016-09-28 LAB — BASIC METABOLIC PANEL
Anion gap: 6 (ref 5–15)
BUN: 21 mg/dL — ABNORMAL HIGH (ref 6–20)
CO2: 24 mmol/L (ref 22–32)
Calcium: 9 mg/dL (ref 8.9–10.3)
Chloride: 107 mmol/L (ref 101–111)
Creatinine, Ser: 1.48 mg/dL — ABNORMAL HIGH (ref 0.61–1.24)
GFR calc Af Amer: >60 mL/min (ref >60)
GFR calc non Af Amer: 53 mL/min — ABNORMAL LOW (ref >60)
Glucose, Bld: 113 mg/dL — ABNORMAL HIGH (ref 65–99)
Potassium: 3.3 mmol/L — ABNORMAL LOW (ref 3.5–5.1)
Sodium: 137 mmol/L (ref 135–145)

## 2016-09-28 LAB — CBC WITH DIFFERENTIAL/PLATELET
Basophils Absolute: 0 10*3/uL (ref 0.0–0.1)
Basophils Relative: 0 %
Eosinophils Absolute: 0.1 10*3/uL (ref 0.0–0.7)
Eosinophils Relative: 1 %
HCT: 35.8 % — ABNORMAL LOW (ref 39.0–52.0)
Hemoglobin: 11.6 g/dL — ABNORMAL LOW (ref 13.0–17.0)
Lymphocytes Relative: 42 %
Lymphs Abs: 2.9 10*3/uL (ref 0.7–4.0)
MCH: 26.8 pg (ref 26.0–34.0)
MCHC: 32.4 g/dL (ref 30.0–36.0)
MCV: 82.7 fL (ref 78.0–100.0)
Monocytes Absolute: 0.5 10*3/uL (ref 0.1–1.0)
Monocytes Relative: 7 %
Neutro Abs: 3.5 10*3/uL (ref 1.7–7.7)
Neutrophils Relative %: 50 %
Platelets: 249 10*3/uL (ref 150–400)
RBC: 4.33 MIL/uL (ref 4.22–5.81)
RDW: 14.8 % (ref 11.5–15.5)
WBC: 7 10*3/uL (ref 4.0–10.5)

## 2016-09-28 LAB — URINALYSIS, ROUTINE W REFLEX MICROSCOPIC
Bacteria, UA: NONE SEEN
Bilirubin Urine: NEGATIVE
Glucose, UA: NEGATIVE mg/dL
Hgb urine dipstick: NEGATIVE
Ketones, ur: NEGATIVE mg/dL
Leukocytes, UA: NEGATIVE
Nitrite: NEGATIVE
Protein, ur: 100 mg/dL — AB
Specific Gravity, Urine: 1.021 (ref 1.005–1.030)
pH: 6 (ref 5.0–8.0)

## 2016-09-28 LAB — GLUCOSE, CAPILLARY
Glucose-Capillary: 86 mg/dL (ref 65–99)
Glucose-Capillary: 102 mg/dL — ABNORMAL HIGH (ref 65–99)
Glucose-Capillary: 119 mg/dL — ABNORMAL HIGH (ref 65–99)
Glucose-Capillary: 126 mg/dL — ABNORMAL HIGH (ref 65–99)

## 2016-09-28 LAB — CK: Total CK: 297 U/L (ref 49–397)

## 2016-09-28 LAB — SODIUM, URINE, RANDOM: Sodium, Ur: 71 mmol/L

## 2016-09-28 LAB — MAGNESIUM: Magnesium: 1.7 mg/dL (ref 1.7–2.4)

## 2016-09-28 LAB — CREATININE, URINE, RANDOM: Creatinine, Urine: 208.4 mg/dL

## 2016-09-28 LAB — MRSA PCR SCREENING: MRSA by PCR: NEGATIVE

## 2016-09-28 MED ORDER — ACETAMINOPHEN 325 MG PO TABS
650.0000 mg | ORAL_TABLET | Freq: Four times a day (QID) | ORAL | Status: DC | PRN
  Filled 2016-09-28: qty 2

## 2016-09-28 MED ORDER — LORAZEPAM 1 MG PO TABS: 1.0000 mg | ORAL_TABLET | Freq: Four times a day (QID) | ORAL | Status: DC | PRN

## 2016-09-28 MED ORDER — LOPERAMIDE HCL 2 MG PO CAPS
4.0000 mg | ORAL_CAPSULE | Freq: Once | ORAL | Status: AC
  Administered 2016-09-28: 4 mg via ORAL
  Filled 2016-09-28: qty 2

## 2016-09-28 MED ORDER — OXYCODONE HCL 5 MG PO TABS
10.0000 mg | ORAL_TABLET | Freq: Four times a day (QID) | ORAL | Status: DC | PRN
Start: 2016-09-28 — End: 2016-09-29
  Administered 2016-09-28 – 2016-09-29 (×3): 10 mg via ORAL
  Filled 2016-09-28 (×3): qty 2

## 2016-09-28 MED ORDER — INSULIN ASPART 100 UNIT/ML ~~LOC~~ SOLN
0.0000 [IU] | SUBCUTANEOUS | Status: DC
  Administered 2016-09-28: 2 [IU] via SUBCUTANEOUS

## 2016-09-28 MED ORDER — ACETAMINOPHEN 650 MG RE SUPP: 650.0000 mg | Freq: Four times a day (QID) | RECTAL | Status: DC | PRN

## 2016-09-28 MED ORDER — MAGNESIUM SULFATE IN D5W 1-5 GM/100ML-% IV SOLN
1.0000 g | Freq: Once | INTRAVENOUS | Status: AC
  Administered 2016-09-28: 1 g via INTRAVENOUS
  Filled 2016-09-28 (×2): qty 100

## 2016-09-28 MED ORDER — FENTANYL CITRATE (PF) 100 MCG/2ML IJ SOLN
50.0000 ug | Freq: Once | INTRAMUSCULAR | Status: AC
  Administered 2016-09-28: 50 ug via INTRAVENOUS
  Filled 2016-09-28: qty 2

## 2016-09-28 MED ORDER — ONDANSETRON HCL 4 MG/2ML IJ SOLN
4.0000 mg | Freq: Once | INTRAMUSCULAR | Status: AC
  Administered 2016-09-28: 4 mg via INTRAVENOUS
  Filled 2016-09-28: qty 2

## 2016-09-28 MED ORDER — SODIUM CHLORIDE 0.9 % IV SOLN
INTRAVENOUS | Status: DC
  Administered 2016-09-28 – 2016-09-29 (×2): via INTRAVENOUS

## 2016-09-28 MED ORDER — HEPARIN SODIUM (PORCINE) 5000 UNIT/ML IJ SOLN
5000.0000 [IU] | Freq: Three times a day (TID) | INTRAMUSCULAR | Status: DC
  Filled 2016-09-28 (×2): qty 1

## 2016-09-28 MED ORDER — CALAMINE EX LOTN
TOPICAL_LOTION | Freq: Two times a day (BID) | CUTANEOUS | Status: DC
  Administered 2016-09-28 – 2016-09-29 (×2): via TOPICAL
  Filled 2016-09-28 (×2): qty 177

## 2016-09-28 MED ORDER — ONDANSETRON HCL 4 MG PO TABS: 4.0000 mg | ORAL_TABLET | Freq: Four times a day (QID) | ORAL | Status: DC | PRN

## 2016-09-28 MED ORDER — POTASSIUM PHOSPHATES 15 MMOLE/5ML IV SOLN
20.0000 meq | Freq: Once | INTRAVENOUS | Status: AC
  Administered 2016-09-28: 20 meq via INTRAVENOUS
  Filled 2016-09-28 (×2): qty 4.55

## 2016-09-28 MED ORDER — INSULIN GLARGINE 100 UNIT/ML ~~LOC~~ SOLN
50.0000 [IU] | Freq: Every day | SUBCUTANEOUS | Status: DC
  Administered 2016-09-28: 50 [IU] via SUBCUTANEOUS
  Filled 2016-09-28: qty 0.5

## 2016-09-28 MED ORDER — DARUNAVIR-COBICISTAT 800-150 MG PO TABS
1.0000 | ORAL_TABLET | Freq: Every day | ORAL | Status: DC
  Administered 2016-09-28 – 2016-09-29 (×2): 1 via ORAL
  Filled 2016-09-28 (×2): qty 1

## 2016-09-28 MED ORDER — ABACAVIR-DOLUTEGRAVIR-LAMIVUD 600-50-300 MG PO TABS
1.0000 | ORAL_TABLET | Freq: Every day | ORAL | Status: DC
Start: 2016-09-28 — End: 2016-09-29
  Administered 2016-09-28 – 2016-09-29 (×2): 1 via ORAL
  Filled 2016-09-28 (×2): qty 1

## 2016-09-28 MED ORDER — HYDRALAZINE HCL 20 MG/ML IJ SOLN
5.0000 mg | INTRAMUSCULAR | Status: DC | PRN
  Administered 2016-09-28: 5 mg via INTRAVENOUS
  Filled 2016-09-28: qty 1

## 2016-09-28 MED ORDER — ONDANSETRON HCL 4 MG/2ML IJ SOLN: 4.0000 mg | Freq: Four times a day (QID) | INTRAMUSCULAR | Status: DC | PRN

## 2016-09-28 MED ORDER — SODIUM CHLORIDE 0.9 % IV BOLUS (SEPSIS)
1000.0000 mL | Freq: Once | INTRAVENOUS | Status: AC
  Administered 2016-09-28: 1000 mL via INTRAVENOUS

## 2016-09-28 MED ORDER — LIDOCAINE 5 % EX PTCH
1.0000 | MEDICATED_PATCH | CUTANEOUS | Status: DC
  Administered 2016-09-28: 1 via TRANSDERMAL
  Filled 2016-09-28: qty 1

## 2016-09-28 NOTE — Progress Notes (Signed)
  PROGRESS NOTE   Brief Narrative:   Agree with note as per Dr. Tamala Julian Brief summarization-   47 ? HIV/AIDS initial diagnosis 1992 [CD4 210 in 05/2016]  NRTI medications E938B, O175Z  Complication of genital warts on imiquimod 5% IDDM on insulin  Prior admission [facility] 05/2014 Chr Pain previously managed by Dr. blunt  Prior MRSA on admission 06/05/16, Rx Doxy/Keflex as OP Previous infection 03/2015 right second finger Neck abscess without formal I&D 04/2014 admission    Admit from [facility] ED with 1 day h/o lightheadedness in setting of vol depletion Also diarr x 1 week?  Bun/cr 1.06-->2.02   He is not passing any stool, his throat is clear does not have any thrush Chest is clear abdomen is soft nontender He has some right knee pain for which have given him a lidocaine patch He does not have any increased work of breathing He asks about pain meds on discharge   We'll graduate him to a regular diet, watch for further diarrhea and if he is stable he may be able to be discharged home in a.m.     Verneita Griffes, MD [phone]   If 7PM-7AM, please contact night-coverage [email or web address] Password [email or web address] 09/28/2016, 9:01 AM

## 2016-09-28 NOTE — ED Provider Notes (Signed)
By signing my name below, I, Jeanell Sparrow, attest that this documentation has been prepared under the direction and in the presence of Janecia Palau, Delice Bison, DO. Electronically Signed: Jeanell Sparrow, Scribe. 09/28/2016. 12:42 AM.  TIME SEEN: 12:42 AM  CHIEF COMPLAINT: Diarrhea  HPI:  HPI Comments: JOAB CARDEN is a 51 y.o. male with a PMHx of DM, HTN, HIV who presents to the Emergency Department complaining of intermittent diarrhea that started about a week ago. He states he got lightheaded and fell on his right knee. No LOC or head injury. He then had about 4 episodes of diarrhea in the past 24 hours. No blood in stool or melena. He reports associated vomiting 2 times a day as well. He reports that he has right knee pain but declines x-ray and states he is able to ambulate. Denies any recent international travel, sick contacts, recent surgery, recent antibiotic use, chest pain, abdominal pain, or other complaints at this time.   PCP: [facility]   ROS: See HPI Constitutional: no fever  Eyes: no drainage  ENT: no runny nose   Cardiovascular:  no chest pain  Resp: no SOB  GI: +diarrhea, +vomiting, +diarrhea GU: no dysuria Integumentary: no rash  Allergy: no hives  Musculoskeletal: +myalgia (right knee), no leg swelling  Neurological: no slurred speech ROS otherwise negative  PAST MEDICAL HISTORY/PAST SURGICAL HISTORY:  Past Medical History:  Diagnosis Date  . AIDS ([place])   . Chronic knee pain    right  . Chronic pain   . Diabetes mellitus   . Diabetes type 2, uncontrolled ([place])    HgA1c 17.6 (04/27/2010)  . Erectile dysfunction   . Genital warts   . HIV (human immunodeficiency virus infection) ([place]) 2009   CD4 count 100, VL 13800 (05/01/2010)  . Hypertension   . Osteomyelitis ([place])    h/o hand    MEDICATIONS:  Prior to Admission medications   Medication Sig Start Date End Date Taking? Authorizing Provider  abacavir-dolutegravir-lamiVUDine (TRIUMEQ) 600-50-300 MG tablet TAKE  1 TABLET BY MOUTH DAILY, STOP GENVOYA AND PREZISTA 09/16/16   [name], MD  cephALEXin (KEFLEX) 500 MG capsule Take 1 capsule (500 mg total) by mouth 3 (three) times daily. 06/05/16   Verlee Monte, MD  dapsone 100 MG tablet TAKE 1 TABLET BY MOUTH EVERY DAY. NEED APPOINTMENT Patient not taking: Reported on 05/13/2016 09/19/14   [name], MD  darunavir-cobicistat (PREZCOBIX) 800-150 MG tablet Take 1 tablet by mouth daily. with food 09/16/16   [name], MD  doxycycline (VIBRA-TABS) 100 MG tablet Take 1 tablet (100 mg total) by mouth 2 (two) times daily. 06/05/16   Verlee Monte, MD  ibuprofen (ADVIL,MOTRIN) 800 MG tablet Take 1 tablet (800 mg total) by mouth every 8 (eight) hours as needed for mild pain. Patient not taking: Reported on 06/04/2016 03/04/16   Hermina Barnard, Delice Bison, DO  imiquimod (ALDARA) 5 % cream Apply topically 3 (three) times a week. Patient not taking: Reported on 05/13/2016 01/30/15   [name], MD  Insulin Glargine (LANTUS SOLOSTAR) 100 UNIT/ML Solostar Pen Inject 60 Units into the skin daily at 10 pm. Patient taking differently: Inject 70 Units into the skin daily at 10 pm.  06/01/14   [name], MD  insulin lispro (HUMALOG) 100 UNIT/ML KiwkPen Inject 0.08 mLs (8 Units total) into the skin 3 (three) times daily. Patient taking differently: Inject 7 Units into the skin 3 (three) times daily.  02/21/14   [name],  MD  LORazepam (ATIVAN) 1 MG tablet Take 1 tablet (1 mg total) by mouth every 6 (six) hours as needed for anxiety. Patient not taking: Reported on 06/04/2016 01/30/15   [name], MD  methocarbamol (ROBAXIN) 500 MG tablet Take 1 tablet (500 mg total) by mouth 2 (two) times daily. 08/06/16   Cardama, Grayce Sessions, MD  Oxycodone HCl 10 MG TABS Take 1 tablet (10 mg total) by mouth 3 (three) times daily. Take 1 tablet 3 times a day. Alternate with naproxen 500 mg every 4 to 6 hours 06/05/16   Verlee Monte, MD    ALLERGIES:   Allergies  Allergen Reactions  . Sulfa Antibiotics Itching    SOCIAL HISTORY:  Social History  Substance Use Topics  . Smoking status: Never Smoker  . Smokeless tobacco: Never Used  . Alcohol use No    FAMILY HISTORY: Family History  Problem Relation Age of Onset  . Hypertension Mother   . Arthritis Father   . Hypertension Father   . Hypertension Brother   . Cancer Maternal Grandmother 7       unknown type of cancer  . Depression Paternal Grandmother     EXAM: BP 102/63   Pulse 90   Temp 98.2 F (36.8 C) (Oral)   Resp 16   SpO2 100%  CONSTITUTIONAL: Alert and oriented and responds appropriately to questions. Well-appearing; well-nourished HEAD: Normocephalic EYES: Conjunctivae clear, pupils appear equal, EOMI ENT: normal nose; moist mucous membranes NECK: Supple, no meningismus, no nuchal rigidity, no LAD  CARD: RRR; S1 and S2 appreciated; no murmurs, no clicks, no rubs, no gallops RESP: Normal chest excursion without splinting or tachypnea; breath sounds clear and equal bilaterally; no wheezes, no rhonchi, no rales, no hypoxia or respiratory distress, speaking full sentences ABD/GI: Normal bowel sounds; non-distended; soft, non-tender, no rebound, no guarding, no peritoneal signs, no hepatosplenomegaly BACK:  The back appears normal and is non-tender to palpation, there is no CVA tenderness EXT: No significant tenderness over the right knee. No bony deformity or joint effusion. No tenderness over the proximal right fibular head. 2+ DP pulses bilaterally. Normal ROM in all joints; non-tender to palpation; no edema; normal capillary refill; no cyanosis, no calf tenderness or swelling    SKIN: Normal color for age and race; warm; no rash NEURO: Moves all extremities equally, Sensation to light touch intact diffusely, normal gait, speech is normal, cranial nerves II through XII intact PSYCH: The patient's mood and manner are appropriate. Grooming and personal hygiene are  appropriate.  MEDICAL DECISION MAKING: Patient here with nausea today and diarrhea for the past week. He states he is also been out in the heat more today and feels dehydrated. He had a near syncopal event where he fell and hit his right knee but did not strike his head or lose consciousness. No focal neurologic deficits. He denies chest pain or shortness of breath. Abdominal exam is benign. Suspect possible viral gastroenteritis. We'll give IV fluids, Zofran, Imodium. We'll give fentanyl for his knee pain but he declines x-ray. Nothing appears fractured at this time. No signs of septic arthritis, gout, DVT. Neurovascular intact distally. Patient's labs show an acute renal failure. Likely from dehydration. Will hydrate patient. I feel he will need admission. Will add on a CK level, magnesium level, urinalysis.  ED PROGRESS: 3:30 AM  Patient's EKG is normal. Magnesium slightly low at 1.7. Will give IV replacement. Still unable to urinate. We'll continue IV hydration. We'll discuss with hospitalist for  admission for acute renal failure likely in the setting of dehydration.  3:42 AM Discussed patient's case with hospitalist, Dr. Myna Hidalgo.  I have recommended admission and patient (and family if present) agree with this plan. Admitting physician will place admission orders.    He does appear patient's blood pressure is going up. Is now 174/114. He is not on blood pressure medication for hypertension. He did come in with slight hypotension likely in the setting of hypovolemia. I do not feel this is sepsis. He has no fever, leukocytosis. I do not feel he needs antibiotics. Abdominal exam is still benign. We'll hold on treating this hypertension given he is currently a dramatic other than feeling weak all over. I feel that treating his high blood pressure this time when he was just recently hypotensive could be dangerous for him. I feel this can be closely monitored in the hospital.    I reviewed all nursing  notes, vitals, pertinent previous records, EKGs, lab and urine results, imaging (as available).     EKG Interpretation  Date/Time:  Saturday September 28 2016 01:21:14 EDT Ventricular Rate:  83 PR Interval:    QRS Duration: 121 QT Interval:  388 QTC Calculation: 456 R Axis:   21 Text Interpretation:  Sinus rhythm Ventricular premature complex Nonspecific intraventricular conduction delay No significant change since last tracing Confirmed by Pryor Curia [phone]) on 09/28/2016 1:55:33 AM       I personally performed the services described in this documentation, which was scribed in my presence. The recorded information has been reviewed and is accurate.     Cerissa Zeiger, Delice Bison, DO 09/28/16 [phone]

## 2016-09-28 NOTE — Progress Notes (Signed)
New Admission Note:   Arrival Method: Wheelchair from ED Mental Orientation: Alert and oriented x4 Telemetry: N/A Assessment: Completed Skin: See docflowsheet IV: L AC- NS @125  Pain: See doc flowsheet Tubes: N/A Safety Measures: Safety Fall Prevention Plan has been given, discussed and signed Admission: Completed [facility] [place] Orientation: Patient has been orientated to the room, unit and staff.  Family: None at bedside  Orders have been reviewed and implemented. Will continue to monitor the patient. Call light has been placed within reach and bed alarm has been activated.   [employer], RN-BC Phone number: [phone]

## 2016-09-28 NOTE — H&P (Signed)
History and Physical    DRAYDON CLAIRMONT RJJ:[id number] DOB: [birth year] DOA: 09/27/2016  PCP: [name], MD   Patient coming from: Home  Chief Complaint: Nausea, vomiting, diarrhea, lightheadedness  HPI: Keith [name] is a 51 y.o. male with medical history significant for HIV/AIDS (CD4 210 and VL undetectable in Feb '18), insulin-dependent diabetes mellitus, and chronic pain, now presenting to the emergency department for evaluation of nausea, vomiting, diarrhea, and lightheadedness. Patient reports that he is in his usual state of health until the insidious development of nausea with vomiting and diarrhea approximately one week ago. Since that time, his symptoms have not necessarily worsened, but have persisted. He denies any recent long distance travel or sick contacts. He denies any fevers, chills, or significant abdominal pain. He denies hematochezia or melena. No recent antibiotics. Last hospital admission was 4 months ago. He has not attempted any interventions for these complaints. He notes that his lightheadedness is worse upon standing, but denies any chest pain, headache, or syncope.  ED Course: Upon arrival to the ED, patient is found to be afebrile, saturating well on room air, blood pressure 94/65, and vitals otherwise stable. EKG features a sinus rhythm with PVC and nonspecific IVCD. Chemistry panels notable for a potassium of 3.4, BUN 21, and serum creatinine of 2.02, up from 1.0 in May 2018. CBC is notable for a stable normocytic anemia with hemoglobin 12.3. Urinalysis with proteinuria. Patient was treated with 2 L normal saline, 1 g of magnesium, Zofran, and fentanyl in the ED. He remained hemodynamically stable, but intolerant of oral intake secondary to nausea. Given the acute renal failure, patient will be observed on the medical/surgical unit for ongoing evaluation and management of his nausea, vomiting, and diarrhea with dehydration and kidney injury.  Review of Systems:    All other systems reviewed and apart from HPI, are negative.  Past Medical History:  Diagnosis Date  . AIDS ([place])   . Chronic knee pain    right  . Chronic pain   . Diabetes mellitus   . Diabetes type 2, uncontrolled ([place])    HgA1c 17.6 (04/27/2010)  . Erectile dysfunction   . Genital warts   . HIV (human immunodeficiency virus infection) ([place]) 2009   CD4 count 100, VL 13800 (05/01/2010)  . Hypertension   . Osteomyelitis [facility])    h/o hand    Past Surgical History:  Procedure Laterality Date  . HERNIA REPAIR    . I&D EXTREMITY Left 08/21/2014   Procedure: INCISION AND DRAINAGE LEFT SMALL FINGER;  Surgeon: Leanora Cover, MD;  Location: [place];  Service: Orthopedics;  Laterality: Left;  . MINOR IRRIGATION AND DEBRIDEMENT OF WOUND Right 04/22/2014   Procedure: IRRIGATION AND DEBRIDEMENT OF RIGHT NECK ABCESS;  Surgeon: Jerrell Belfast, MD;  Location: [place];  Service: ENT;  Laterality: Right;  . MULTIPLE EXTRACTIONS WITH ALVEOLOPLASTY N/A 01/18/2013   Procedure: MULTIPLE EXTRACION 3, 6, 7, 10, 11, 13, 21, 22, 27, 28, 29, 30 WITH ALVEOLOPLASTY;  Surgeon: Gae Bon, DDS;  Location: [place];  Service: Oral Surgery;  Laterality: N/A;     reports that he has never smoked. He has never used smokeless tobacco. He reports that he does not drink alcohol or use drugs.  Allergies  Allergen Reactions  . Sulfa Antibiotics Itching    Family History  Problem Relation Age of Onset  . Hypertension Mother   . Arthritis Father   . Hypertension Father   . Hypertension Brother   . Cancer  Maternal Grandmother 23       unknown type of cancer  . Depression Paternal Grandmother      Prior to Admission medications   Medication Sig Start Date End Date Taking? Authorizing Provider  abacavir-dolutegravir-lamiVUDine (TRIUMEQ) [phone] MG tablet TAKE 1 TABLET BY MOUTH DAILY, STOP GENVOYA AND PREZISTA 09/16/16  Yes Hatcher, Doroteo Bradford, MD  darunavir-cobicistat (PREZCOBIX) 800-150 MG tablet Take 1  tablet by mouth daily. with food 09/16/16  Yes [name], MD  ibuprofen (ADVIL,MOTRIN) 800 MG tablet Take 1 tablet (800 mg total) by mouth every 8 (eight) hours as needed for mild pain. 03/04/16  Yes Ward, Delice Bison, DO  Insulin Glargine (LANTUS SOLOSTAR) 100 UNIT/ML Solostar Pen Inject 60 Units into the skin daily at 10 pm. Patient taking differently: Inject 70 Units into the skin daily at 10 pm.  06/01/14  Yes Darrick Meigs, Marge Duncans, MD  LORazepam (ATIVAN) 1 MG tablet Take 1 tablet (1 mg total) by mouth every 6 (six) hours as needed for anxiety. 01/30/15  Yes [name], MD  Oxycodone HCl 10 MG TABS Take 1 tablet (10 mg total) by mouth 3 (three) times daily. Take 1 tablet 3 times a day. Alternate with naproxen 500 mg every 4 to 6 hours Patient taking differently: Take 10 mg by mouth 3 (three) times daily.  06/05/16  Yes Verlee Monte, MD    Physical Exam: Vitals:   09/27/16 2024 09/27/16 2340 09/28/16 0329  BP: 94/65 102/63 (!) 184/125  Pulse: 92 90 82  Resp: 16 16 16   Temp: 98.1 F (36.7 C) 98.2 F (36.8 C)   TempSrc:  Oral   SpO2: 100% 100% 99%      Constitutional: NAD, calm, in apparent discomfort Eyes: PERTLA, lids and conjunctivae normal ENMT: Mucous membranes are dry. Posterior pharynx clear of any exudate or lesions.   Neck: normal, supple, no masses, no thyromegaly Respiratory: clear to auscultation bilaterally, no wheezing, no crackles. Normal respiratory effort.   Cardiovascular: S1 & S2 heard, regular rate and rhythm, soft systolic murmur at RUSB. No significant JVD. Abdomen: No distension, no tenderness, no masses palpated. Bowel sounds active.  Musculoskeletal: no clubbing / cyanosis. No joint deformity upper and lower extremities.   Skin: no significant rashes, lesions, ulcers. Warm, dry, well-perfused. Poor turgor.  Neurologic: CN 2-12 grossly intact. Sensation intact, DTR normal. Strength 5/5 in all 4 limbs.  Psychiatric: Alert and oriented x 3. Calm and  cooperative.     Labs on Admission: I have personally reviewed following labs and imaging studies  CBC:  Recent Labs Lab 09/27/16 2028  WBC 8.5  HGB 12.3*  HCT 37.6*  MCV 82.3  PLT 211   Basic Metabolic Panel:  Recent Labs Lab 09/27/16 2028 09/28/16 0134  NA 137  --   K 3.4*  --   CL 107  --   CO2 22  --   GLUCOSE 128*  --   BUN 21*  --   CREATININE 2.02*  --   CALCIUM 9.5  --   MG  --  1.7   GFR: CrCl cannot be calculated (Unknown ideal weight.). Liver Function Tests:  Recent Labs Lab 09/27/16 2028  AST 19  ALT 12*  ALKPHOS 47  BILITOT 0.5  PROT 7.8  ALBUMIN 4.0    Recent Labs Lab 09/27/16 2028  LIPASE 22   No results for input(s): AMMONIA in the last 168 hours. Coagulation Profile: No results for input(s): INR, PROTIME in the last 168 hours.  Cardiac Enzymes:  Recent Labs Lab 09/28/16 0134  CKTOTAL 297   BNP (last 3 results) No results for input(s): PROBNP in the last 8760 hours. HbA1C: No results for input(s): HGBA1C in the last 72 hours. CBG: No results for input(s): GLUCAP in the last 168 hours. Lipid Profile: No results for input(s): CHOL, HDL, LDLCALC, TRIG, CHOLHDL, LDLDIRECT in the last 72 hours. Thyroid Function Tests: No results for input(s): TSH, T4TOTAL, FREET4, T3FREE, THYROIDAB in the last 72 hours. Anemia Panel: No results for input(s): VITAMINB12, FOLATE, FERRITIN, TIBC, IRON, RETICCTPCT in the last 72 hours. Urine analysis:    Component Value Date/Time   COLORURINE YELLOW 09/28/2016 0336   APPEARANCEUR HAZY (A) 09/28/2016 0336   LABSPEC 1.021 09/28/2016 0336   PHURINE 6.0 09/28/2016 0336   GLUCOSEU NEGATIVE 09/28/2016 0336   HGBUR NEGATIVE 09/28/2016 0336   BILIRUBINUR NEGATIVE 09/28/2016 0336   KETONESUR NEGATIVE 09/28/2016 0336   PROTEINUR 100 (A) 09/28/2016 0336   UROBILINOGEN 1.0 12/10/2014 2334   NITRITE NEGATIVE 09/28/2016 0336   LEUKOCYTESUR NEGATIVE 09/28/2016 0336   Sepsis  Labs: @LABRCNTIP (procalcitonin:4,lacticidven:4) )No results found for this or any previous visit (from the past 240 hour(s)).   Radiological Exams on Admission: No results found.  EKG: Independently reviewed. Sinus rhythm, PVC, non-specific IVCD.   Assessment/Plan  1. Acute kidney injury  - SCr is 2.02 on admission, up from 1.06 in May 2018  - Likely a prerenal azotemia in setting of N/V/D  - Given 2 liters NS in ED and continued on NS infusion  - Check renal US and urine studies  - Hold his Advil, repeat chemistries in am   2. Nausea, vomiting, diarrhea  - No fever and no abd pain  - Denies travel or sick-contacts - No recent abx or hospitalization  - Maintain enteric precautions, check GI panel and C diff assay  - Monitor and correct lytes, advance diet as tolerated    3. HIV - CD4 210 and VL undetectable in February 2018  - Reports strict adherence to HAART  - Continue Triumeq, Prezcobix    4. Insulin-dependent DM  - A1c was 13.2% in February 2018  - Managed at home with Lantus 70 qD  - Check CBG q4h until he is tolerating a diet  - Continue Lantus with 50 units qHS and start Novolog per moderate-intensity sliding-scale   5. Chronic pain  - Stable  - Continue home regimen with oxycodone     DVT prophylaxis: sq heparin  Code Status: Full  Family Communication: Discussed with patient Disposition Plan: Observe on med-surg Consults called: None Admission status: Observation    Vianne Bulls, MD [facility] Pager [phone]  If 7PM-7AM, please contact night-coverage [email or web address] Password [facility]  09/28/2016, 4:17 AM

## 2016-09-28 NOTE — Progress Notes (Signed)
Received report from ED RN. Room ready for patient. Alexsandria Kivett Joselita, RN 

## 2016-09-29 DIAGNOSIS — E860 Dehydration: Secondary | ICD-10-CM | POA: Diagnosis present

## 2016-09-29 DIAGNOSIS — N179 Acute kidney failure, unspecified: Secondary | ICD-10-CM | POA: Diagnosis not present

## 2016-09-29 LAB — BASIC METABOLIC PANEL
Anion gap: 7 (ref 5–15)
BUN: 12 mg/dL (ref 6–20)
CO2: 23 mmol/L (ref 22–32)
Calcium: 8.8 mg/dL — ABNORMAL LOW (ref 8.9–10.3)
Chloride: 109 mmol/L (ref 101–111)
Creatinine, Ser: 1.08 mg/dL (ref 0.61–1.24)
GFR calc Af Amer: >60 mL/min (ref >60)
GFR calc non Af Amer: >60 mL/min (ref >60)
Glucose, Bld: 86 mg/dL (ref 65–99)
Potassium: 3.3 mmol/L — ABNORMAL LOW (ref 3.5–5.1)
Sodium: 139 mmol/L (ref 135–145)

## 2016-09-29 LAB — GLUCOSE, CAPILLARY
Glucose-Capillary: 101 mg/dL — ABNORMAL HIGH (ref 65–99)
Glucose-Capillary: 148 mg/dL — ABNORMAL HIGH (ref 65–99)
Glucose-Capillary: 56 mg/dL — ABNORMAL LOW (ref 65–99)
Glucose-Capillary: 87 mg/dL (ref 65–99)

## 2016-09-29 LAB — UREA NITROGEN, URINE: Urea Nitrogen, Ur: 819 mg/dL

## 2016-09-29 MED ORDER — CALAMINE EX LOTN: TOPICAL_LOTION | Freq: Two times a day (BID) | CUTANEOUS | 0 refills | Status: DC

## 2016-09-29 MED ORDER — OXYCODONE HCL 10 MG PO TABS: 10.0000 mg | ORAL_TABLET | Freq: Three times a day (TID) | ORAL | 0 refills | Status: DC | PRN

## 2016-09-29 NOTE — Discharge Planning (Signed)
Pt given discharge instructions, prescriptions and follow up info. Denies questions. Friend to pick pt up,

## 2016-09-29 NOTE — Discharge Summary (Signed)
Physician Discharge Summary  Keith [name] VQM:[id number] DOB: 30-Aug-1965 DOA: 09/27/2016  PCP: [name], MD  Admit date: 09/27/2016 Discharge date: 09/29/2016  Time spent: 25 minutes  Recommendations for Outpatient Follow-up:  1. Patient should be seen and followed his primary care physician for consideration of right knee replacement secondary to osteoarthritis and was given 6 tablets of oxycodone on discharge 2. He should follow-up for viral load and other monitoring as per infectious disease at [facility] and keep his appointment 3. Please get basic metabolic panel in 1 week  Discharge Diagnoses:  Principal Problem:   AKI (acute kidney injury) ([place]) Active Problems:   Essential hypertension   Chronic pain   Insulin-requiring or dependent type II diabetes mellitus ([place])   HIV disease ([place])   Diarrhea with dehydration   Nausea vomiting and diarrhea   Dehydration   Discharge Condition: Improved  Diet recommendation: Heart healthy  Filed Weights   09/28/16 0554 09/28/16 2112  Weight: 117.9 kg (260 lb) 118.6 kg (261 lb 8 oz)    History of present illness:  50 ? HIV/AIDS initial diagnosis 1992 [CD4 210 in 05/2016]             NRTI medications V106I, M184V             Complication of genital warts on imiquimod 5% IDDM on insulin             Prior admission [place] 05/2014 Chr Pain previously managed by Dr. blunt now managed by primary care physician  Prior MRSA on admission 06/05/16, Rx Doxy/Keflex as OP Previous infection 03/2015 right second finger Neck abscess without formal I&D 04/2014 admission               Admit from [facility] ED with 1 day h/o lightheadedness in setting of vol depletion Also diarr x 1 week?  Bun/cr 1.06-->2.02  Hospital Course:  Acute kidney injury Resolved with hydration saline 1 25 cc per hour, no diarrhea noted, on discharge creatinine was back down from 2.0 to 1.08 Encouraged to discontinue ibuprofen and other nonsteroidals as above Needs labs  in 1 week at primary physician office  ? Nausea vomiting diarrhea Did have this a couple of days prior to admission however none seen here, patient eating drinking well therefore no stool specimens were taken and sounds like this may have iron therapy a transient viral illness or nonspecific noninfectious diarrhea He was stabilized prior to discharge  Insulin-dependent diabetes mellitus Continue home meds of long-acting insulin etc. etc.  Genital warts Follow-up with dermatologist for further treatment  AIDS since 1990 2 outpatient follow-up with infectious disease specialist  Chronic pain Has right knee pain and has had injections in the knee before He says nothing works for this and I would encourage him to follow-up with his primary care physician for coordination of outpatient orthopedic follow-up and potential R TKR  Discharge Exam: Vitals:   09/29/16 0432 09/29/16 0743  BP: 125/77 124/75  Pulse: 79 75  Resp: 18 18  Temp: 97.8 F (36.6 C) 97.8 F (36.6 C)    General: Awake alert in some pain in his right knee however otherwise doing well Extraocular movements intact , No thrush No JVD Cardiovascular: S1-S2 no murmur rub or gallop Respiratory: Clinically clear no added sound Slight tenderness to lateral joint line right knee patient refuses further orthopedic exam  Discharge Instructions   Discharge Instructions    Diet - low sodium heart healthy    Complete by:  As  directed    Discharge instructions    Complete by:  As directed    You had some dehydration and should not take over-the-counter medications such as nonsteroidals like ibuprofen instead he should try high-dose Tylenol up to 3 g a day for short period of time until you can see her primary care physician who will refill the oxycodone [place] do not permit me to fill more than 1-2 days of your chronic pain medications and I would encourage you to follow-up with her primary care physicians  for refills at the earliest   Increase activity slowly    Complete by:  As directed      Current Discharge Medication List    START taking these medications   Details  calamine lotion Apply topically 2 (two) times daily. Qty: 120 mL, Refills: 0      CONTINUE these medications which have CHANGED   Details  Oxycodone HCl 10 MG TABS Take 1 tablet (10 mg total) by mouth every 8 (eight) hours as needed (moderate or severe pain). Qty: 6 tablet, Refills: 0      CONTINUE these medications which have NOT CHANGED   Details  abacavir-dolutegravir-lamiVUDine (TRIUMEQ) 600-50-300 MG tablet TAKE 1 TABLET BY MOUTH DAILY, STOP GENVOYA AND PREZISTA Qty: 30 tablet, Refills: 3    darunavir-cobicistat (PREZCOBIX) 800-150 MG tablet Take 1 tablet by mouth daily. with food Qty: 30 tablet, Refills: 3    Insulin Glargine (LANTUS SOLOSTAR) 100 UNIT/ML Solostar Pen Inject 60 Units into the skin daily at 10 pm. Qty: 15 mL, Refills: 3   Associated Diagnoses: Type 2 diabetes with circulatory disorder causing erectile dysfunction (HCC)    LORazepam (ATIVAN) 1 MG tablet Take 1 tablet (1 mg total) by mouth every 6 (six) hours as needed for anxiety. Qty: 30 tablet, Refills: 3      STOP taking these medications     ibuprofen (ADVIL,MOTRIN) 800 MG tablet        Allergies  Allergen Reactions  . Sulfa Antibiotics Itching      The results of significant diagnostics from this hospitalization (including imaging, microbiology, ancillary and laboratory) are listed below for reference.    Significant Diagnostic Studies: US Renal  Result Date: 09/28/2016 CLINICAL DATA:  Acute renal injury. EXAM: RENAL / URINARY TRACT ULTRASOUND COMPLETE COMPARISON:  None. FINDINGS: Right Kidney: Length: 13.4 cm. Mild increased cortical echogenicity. No mass or hydronephrosis visualized. Left Kidney: Length: 13.7 cm. Mild increased cortical echogenicity. No mass or hydronephrosis visualized. Bladder: Appears normal for degree  of bladder distention. Bilateral ureteral jets visualized. IMPRESSION: Normal size kidneys without hydronephrosis. Mild increased cortical echogenicity which can be seen with medical renal disease. Electronically Signed   By: Marin Olp M.D.   On: 09/28/2016 09:13    Microbiology: Recent Results (from the past 240 hour(s))  MRSA PCR Screening     Status: None   Collection Time: 09/28/16  5:51 AM  Result Value Ref Range Status   MRSA by PCR NEGATIVE NEGATIVE Final    Comment:        The GeneXpert MRSA Assay (FDA approved for NASAL specimens only), is one component of a comprehensive MRSA colonization surveillance program. It is not intended to diagnose MRSA infection nor to guide or monitor treatment for MRSA infections.      Labs: Basic Metabolic Panel:  Recent Labs Lab 09/27/16 2028 09/28/16 0134 09/28/16 0501 09/29/16 0752  NA 137  --  137 139  K 3.4*  --  3.3* 3.3*  CL 107  --  107 109  CO2 22  --  24 23  GLUCOSE 128*  --  113* 86  BUN 21*  --  21* 12  CREATININE 2.02*  --  1.48* 1.08  CALCIUM 9.5  --  9.0 8.8*  MG  --  1.7  --   --    Liver Function Tests:  Recent Labs Lab 09/27/16 2028  AST 19  ALT 12*  ALKPHOS 47  BILITOT 0.5  PROT 7.8  ALBUMIN 4.0    Recent Labs Lab 09/27/16 2028  LIPASE 22   No results for input(s): AMMONIA in the last 168 hours. CBC:  Recent Labs Lab 09/27/16 2028 09/28/16 0501  WBC 8.5 7.0  NEUTROABS  --  3.5  HGB 12.3* 11.6*  HCT 37.6* 35.8*  MCV 82.3 82.7  PLT 260 249   Cardiac Enzymes:  Recent Labs Lab 09/28/16 0134  CKTOTAL 297   BNP: BNP (last 3 results) No results for input(s): BNP in the last 8760 hours.  ProBNP (last 3 results) No results for input(s): PROBNP in the last 8760 hours.  CBG:  Recent Labs Lab 09/28/16 2006 09/29/16 0006 09/29/16 0422 09/29/16 0739 09/29/16 0835  GLUCAP 126* 101* 87 56* 148*       Signed:  Nita Sells MD   [facility] 09/29/2016,  10:10 AM

## 2016-10-01 ENCOUNTER — Ambulatory Visit: Payer: Medicare Other | Admitting: Family Medicine

## 2016-10-10 ENCOUNTER — Telehealth: Payer: Self-pay | Admitting: *Deleted

## 2016-10-10 NOTE — Telephone Encounter (Signed)
Patient called and requested that I return his call regarding a referral for diabetes. I tried to return his call x 2 and got his voice mail. Left a message asking that he call back. Patient was already referred and it is not clear if he kept this referral. Myrtis Hopping

## 2016-10-11 ENCOUNTER — Telehealth: Payer: Self-pay | Admitting: *Deleted

## 2016-10-11 NOTE — Telephone Encounter (Signed)
Called patient and left a voice mail to return the call. He is rescheduled to establish with primary care at [facility] on 10/24/16 at 10:30 AM. Patient actually called me and requested this appt for his diabetes. He is concerned because his sugar has dropped very low recently. Myrtis Hopping

## 2016-10-14 ENCOUNTER — Telehealth: Payer: Self-pay | Admitting: *Deleted

## 2016-10-14 NOTE — Telephone Encounter (Signed)
Patient walked into clinic today because he no longer has a phone and he did not have the information on his new PCP referral. Gave him a print out of the appt at [facility], along with directions and phone #. He also wanted to know how his labs were and I explained that he is undetectable and he is doing well. Myrtis Hopping

## 2016-10-24 ENCOUNTER — Encounter (HOSPITAL_COMMUNITY): Payer: Self-pay | Admitting: Emergency Medicine

## 2016-10-24 ENCOUNTER — Emergency Department (HOSPITAL_COMMUNITY)
Admission: EM | Admit: 2016-10-24 | Discharge: 2016-10-25 | Disposition: A | Discharge status: Home / Self Care | Payer: Medicare Other | Attending: Emergency Medicine | Admitting: Emergency Medicine

## 2016-10-24 ENCOUNTER — Encounter: Payer: Self-pay | Admitting: Family Medicine

## 2016-10-24 ENCOUNTER — Ambulatory Visit (INDEPENDENT_AMBULATORY_CARE_PROVIDER_SITE_OTHER): Payer: Medicare Other | Admitting: Family Medicine

## 2016-10-24 VITALS — BP 128/88 | HR 88 | Wt 269.0 lb

## 2016-10-24 DIAGNOSIS — B20 Human immunodeficiency virus [HIV] disease: Secondary | ICD-10-CM | POA: Insufficient documentation

## 2016-10-24 DIAGNOSIS — E669 Obesity, unspecified: Secondary | ICD-10-CM

## 2016-10-24 DIAGNOSIS — S90935A Unspecified superficial injury of left lesser toe(s), initial encounter: Secondary | ICD-10-CM | POA: Diagnosis not present

## 2016-10-24 DIAGNOSIS — IMO0001 Reserved for inherently not codable concepts without codable children: Secondary | ICD-10-CM

## 2016-10-24 DIAGNOSIS — Z79899 Other long term (current) drug therapy: Secondary | ICD-10-CM | POA: Diagnosis not present

## 2016-10-24 DIAGNOSIS — Z6841 Body Mass Index (BMI) 40.0 and over, adult: Secondary | ICD-10-CM

## 2016-10-24 DIAGNOSIS — I10 Essential (primary) hypertension: Secondary | ICD-10-CM | POA: Insufficient documentation

## 2016-10-24 DIAGNOSIS — E119 Type 2 diabetes mellitus without complications: Secondary | ICD-10-CM

## 2016-10-24 DIAGNOSIS — Z130 Encounter for screening for diseases of the blood and blood-forming organs and certain disorders involving the immune mechanism: Secondary | ICD-10-CM | POA: Diagnosis not present

## 2016-10-24 DIAGNOSIS — Z7982 Long term (current) use of aspirin: Secondary | ICD-10-CM | POA: Diagnosis not present

## 2016-10-24 DIAGNOSIS — B353 Tinea pedis: Secondary | ICD-10-CM

## 2016-10-24 DIAGNOSIS — G894 Chronic pain syndrome: Secondary | ICD-10-CM | POA: Diagnosis not present

## 2016-10-24 DIAGNOSIS — L089 Local infection of the skin and subcutaneous tissue, unspecified: Secondary | ICD-10-CM | POA: Insufficient documentation

## 2016-10-24 DIAGNOSIS — M79605 Pain in left leg: Secondary | ICD-10-CM | POA: Diagnosis present

## 2016-10-24 LAB — POCT GLYCOSYLATED HEMOGLOBIN (HGB A1C): Hemoglobin A1C: 8.5

## 2016-10-24 LAB — CBG MONITORING, ED: Glucose-Capillary: 152 mg/dL — ABNORMAL HIGH (ref 65–99)

## 2016-10-24 MED ORDER — INSULIN STARTER KIT- PEN NEEDLES (ENGLISH): 1.0000 | Freq: Once | Status: DC

## 2016-10-24 MED ORDER — LISINOPRIL 5 MG PO TABS: 5.0000 mg | ORAL_TABLET | Freq: Every day | ORAL | 3 refills | Status: DC

## 2016-10-24 MED ORDER — ASPIRIN EC 81 MG PO TBEC: 81.0000 mg | DELAYED_RELEASE_TABLET | Freq: Every day | ORAL | 0 refills | Status: DC

## 2016-10-24 MED ORDER — INSULIN ASPART 100 UNIT/ML FLEXPEN: 3.0000 [IU] | PEN_INJECTOR | Freq: Three times a day (TID) | SUBCUTANEOUS | 11 refills | Status: DC

## 2016-10-24 MED ORDER — BLOOD GLUCOSE MONITOR KIT
PACK | 0 refills | Status: DC
Start: 2016-10-24 — End: 2017-01-06

## 2016-10-24 NOTE — ED Notes (Signed)
Patient requesting CBG

## 2016-10-24 NOTE — ED Triage Notes (Signed)
Pt presents to ED for left foot and knee pain.  Denies injury.  States pain has been going on for approx 2 weeks.  Pt states pain is on the bottom of his foot, and front of his knee.

## 2016-10-24 NOTE — Patient Instructions (Addendum)
For your diabetes management: I am adding short-acting insulin 3 units of short acting insulin three times daily. Continue long acting insulin 60 units at bedtime  Please start checking your blood sugar daily in the morning and at bedtime. Keep a log of readings. I am adding lisinopril 5 mg once daily for kidney protection. Start taking a baby aspirin 81 mg once daily for cardiovascular health. I am referring you to pain management for chronic pain treatment.  Diabetes Mellitus and Food It is important for you to manage your blood sugar (glucose) level. Your blood glucose level can be greatly affected by what you eat. Eating healthier foods in the appropriate amounts throughout the day at about the same time each day will help you control your blood glucose level. It can also help slow or prevent worsening of your diabetes mellitus. Healthy eating may even help you improve the level of your blood pressure and reach or maintain a healthy weight. General recommendations for healthful eating and cooking habits include:  Eating meals and snacks regularly. Avoid going long periods of time without eating to lose weight.  Eating a diet that consists mainly of plant-based foods, such as fruits, vegetables, nuts, legumes, and whole grains.  Using low-heat cooking methods, such as baking, instead of high-heat cooking methods, such as deep frying.  Work with your dietitian to make sure you understand how to use the Nutrition Facts information on food labels. How can food affect me? Carbohydrates Carbohydrates affect your blood glucose level more than any other type of food. Your dietitian will help you determine how many carbohydrates to eat at each meal and teach you how to count carbohydrates. Counting carbohydrates is important to keep your blood glucose at a healthy level, especially if you are using insulin or taking certain medicines for diabetes mellitus. Alcohol Alcohol can cause sudden decreases  in blood glucose (hypoglycemia), especially if you use insulin or take certain medicines for diabetes mellitus. Hypoglycemia can be a life-threatening condition. Symptoms of hypoglycemia (sleepiness, dizziness, and disorientation) are similar to symptoms of having too much alcohol. If your health care provider has given you approval to drink alcohol, do so in moderation and use the following guidelines:  Women should not have more than one drink per day, and men should not have more than two drinks per day. One drink is equal to: ? 12 oz of beer. ? 5 oz of wine. ? 1 oz of hard liquor.  Do not drink on an empty stomach.  Keep yourself hydrated. Have water, diet soda, or unsweetened iced tea.  Regular soda, juice, and other mixers might contain a lot of carbohydrates and should be counted.  What foods are not recommended? As you make food choices, it is important to remember that all foods are not the same. Some foods have fewer nutrients per serving than other foods, even though they might have the same number of calories or carbohydrates. It is difficult to get your body what it needs when you eat foods with fewer nutrients. Examples of foods that you should avoid that are high in calories and carbohydrates but low in nutrients include:  Trans fats (most processed foods list trans fats on the Nutrition Facts label).  Regular soda.  Juice.  Candy.  Sweets, such as cake, pie, doughnuts, and cookies.  Fried foods.  What foods can I eat? Eat nutrient-rich foods, which will nourish your body and keep you healthy. The food you should eat also will depend on  several factors, including:  The calories you need.  The medicines you take.  Your weight.  Your blood glucose level.  Your blood pressure level.  Your cholesterol level.  You should eat a variety of foods, including:  Protein. ? Lean cuts of meat. ? Proteins low in saturated fats, such as fish, egg whites, and beans.  Avoid processed meats.  Fruits and vegetables. ? Fruits and vegetables that may help control blood glucose levels, such as apples, mangoes, and yams.  Dairy products. ? Choose fat-free or low-fat dairy products, such as milk, yogurt, and cheese.  Grains, bread, pasta, and rice. ? Choose whole grain products, such as multigrain bread, whole oats, and brown rice. These foods may help control blood pressure.  Fats. ? Foods containing healthful fats, such as nuts, avocado, olive oil, canola oil, and fish.  Does everyone with diabetes mellitus have the same meal plan? Because every person with diabetes mellitus is different, there is not one meal plan that works for everyone. It is very important that you meet with a dietitian who will help you create a meal plan that is just right for you. This information is not intended to replace advice given to you by your health care provider. Make sure you discuss any questions you have with your health care provider. Document Released: 12/20/2004 Document Revised: 08/31/2015 Document Reviewed: 02/19/2013 Elsevier Interactive Patient Education  2017 [employer].

## 2016-10-24 NOTE — Progress Notes (Signed)
[name], male    DOB: 04/28/1965, 51 y.o.   MRN: [id number]  PCP: Scot Jun, FNP  Chief Complaint  Patient presents with  . Follow-up    Subjective:  HPI Keith [name] is a 51 y.o. male presents to establish care.  Medical problems include: HIV, T2DM, Chronic Pain, Chronic Opioid Medication, Hypertension, Hyperlipidemia, Diabetic Neuropathy.  Chronic Pain Violet was previously followed by Dr. [name] for primary care. He reports that his major concern today is diabetes and pain management. He is currently receiving Oxycodone 10 mg every 4 hours as needed for chronic back and leg pain. Opioid medication had previously been prescribed through his primary care office. Reports attempting pain relief with Gabapentin, Lyrica, and tramadol without relief of pain symptoms.   Diabetes Keith [name] does monitor his glucose at home. He is currently prescribed Lantus 60 units at bedtime only for diabetes. He is unaware of any previous trials with Metformin or other oral antidiabetes medications. Keith [name] reports that he often experiences blood sugars greater than 200-300 in the middle of night. He is not prescribed any short-acting insulin. Reports chronic leg and feet pain which he attributes to uncontrolled diabetes. Reports in the past having an hemoglobin a1c which exceeded 20. Last A1C on EMR was 4 months prior 13.2. Reports no recent eye exam. He is not prescribed an ACE/ARB. Takes aspirin 81 mg daily. He doesn't engage in routine physical exercise or eat restrict eating to diabetes diet. Current Body mass index is 39.72 kg/m.   HIV  Keith [name] is followed by Infectious Disease for management of HIV. Last appointment occurred back in February. He see Dr. [name], currently compliant with antiviral therapy. He reports this next follow-up is in August. Social History   Social History  . Marital status: Single    Spouse name: N/A  . Number of children: 27  . Years  of education: 12   Occupational History  . Not on file.   Social History Main Topics  . Smoking status: Never Smoker  . Smokeless tobacco: Never Used  . Alcohol use No  . Drug use: No  . Sexual activity: Yes    Partners: Female     Comment: given condoms   Other Topics Concern  . Not on file   Social History Narrative   Worked for the city of [place] for 18 years.   Unemployed.    Applying for disability.   Medicaid patient.    Family History  Problem Relation Age of Onset  . Hypertension Mother   . Arthritis Father   . Hypertension Father   . Hypertension Brother   . Cancer Maternal Grandmother 70       unknown type of cancer  . Depression Paternal Grandmother    Review of Systems See HPI Patient Active Problem List   Diagnosis Date Noted  . Dehydration 09/29/2016  . AKI (acute kidney injury) ([place]) 09/28/2016  . Diarrhea with dehydration 09/28/2016  . Nausea vomiting and diarrhea 09/28/2016  . Cellulitis and abscess of hand 06/04/2016  . Cellulitis of left hand 06/04/2016  . Onychomycosis of multiple toenails with type 2 diabetes mellitus ([place]) 08/29/2015  . MRSA carrier 04/20/2014  . Testosterone deficiency 04/20/2014  . Genital warts 02/22/2014  . HIV disease ([place])   . Insulin-requiring or dependent type II diabetes mellitus ([place]) 02/04/2014  . Dental anomaly 11/20/2012  . Arthritis of right knee 02/23/2012  . Hyperlipidemia 11/10/2011  . Chronic pain 08/07/2011  .  Meralgia paraesthetica 04/23/2011  . ERECTILE DYSFUNCTION 08/22/2008  . Essential hypertension 05/19/2008    Allergies  Allergen Reactions  . Sulfa Antibiotics Itching    Prior to Admission medications   Medication Sig Start Date End Date Taking? Authorizing Provider  abacavir-dolutegravir-lamiVUDine (TRIUMEQ) 222-97-989 MG tablet TAKE 1 TABLET BY MOUTH DAILY, STOP GENVOYA AND PREZISTA 09/16/16  Yes [name], MD  calamine lotion Apply topically 2 (two) times daily. 09/29/16   Yes Nita Sells, MD  darunavir-cobicistat (PREZCOBIX) 800-150 MG tablet Take 1 tablet by mouth daily. with food 09/16/16  Yes [name], MD  Insulin Glargine (LANTUS SOLOSTAR) 100 UNIT/ML Solostar Pen Inject 60 Units into the skin daily at 10 pm. Patient taking differently: Inject 70 Units into the skin daily at 10 pm.  06/01/14  Yes Darrick Meigs, Marge Duncans, MD  LORazepam (ATIVAN) 1 MG tablet Take 1 tablet (1 mg total) by mouth every 6 (six) hours as needed for anxiety. 01/30/15  Yes [name], MD  Oxycodone HCl 10 MG TABS Take 1 tablet (10 mg total) by mouth every 8 (eight) hours as needed (moderate or severe pain). 09/29/16  Yes Nita Sells, MD    Past Medical, Surgical Family and Social History reviewed and updated.    Objective:   Vitals:   10/24/16 1135  BP: 128/88  Pulse: 88    Wt Readings from Last 3 Encounters:  10/24/16 269 lb (122 kg)  09/28/16 261 lb 8 oz (118.6 kg)  06/10/16 254 lb (115.2 kg)   Physical Exam  Constitutional: He is oriented to person, place, and time. He appears well-developed and well-nourished.  HENT:  Head: Normocephalic and atraumatic.  Right Ear: External ear normal.  Left Ear: External ear normal.  Eyes: Pupils are equal, round, and reactive to light. Conjunctivae are normal.  Neck: Normal range of motion. Neck supple.  Cardiovascular: Normal rate, regular rhythm, normal heart sounds and intact distal pulses.   Pulmonary/Chest: Effort normal and breath sounds normal.  Musculoskeletal: Normal range of motion.  Neurological: He is alert and oriented to person, place, and time.  Skin: Skin is warm and dry.  Psychiatric: He has a normal mood and affect. His behavior is normal. Judgment and thought content normal.   Assessment & Plan:  1. Type 2 diabetes mellitus without complication, without long-term current use of insulin (HCC) - POCT glycosylated hemoglobin (Hb A1C), 8.5, improved from 4 months prior. -Continue Lantus  60 units at bedtime -Start Novolog, 3 units, 3 times daily with meals. -Pending receipt and review of labs, will consider prescribing metformin to achieve A1C less than 8.  - insulin starter kit- pen needles (English) 1 kit; 1 kit by Other route once.  2. Chronic pain syndrome, advise patient that he would not be able to receive receive any opioid medication for his chronic pain from this practice. He is being referred to pain management. Patient verbalized understanding. - Ambulatory referral to Pain Clinic   RTC: Tomorrow morning for fasting labs. 1 month for diabetes follow-up    Carroll Sage. Kenton Kingfisher, MSN, FNP-C [facility]  [address] [place], [place] [address] [phone]

## 2016-10-25 ENCOUNTER — Other Ambulatory Visit (INDEPENDENT_AMBULATORY_CARE_PROVIDER_SITE_OTHER): Payer: Medicare Other

## 2016-10-25 DIAGNOSIS — B353 Tinea pedis: Secondary | ICD-10-CM | POA: Diagnosis not present

## 2016-10-25 DIAGNOSIS — E669 Obesity, unspecified: Secondary | ICD-10-CM | POA: Diagnosis not present

## 2016-10-25 DIAGNOSIS — Z130 Encounter for screening for diseases of the blood and blood-forming organs and certain disorders involving the immune mechanism: Secondary | ICD-10-CM | POA: Diagnosis not present

## 2016-10-25 DIAGNOSIS — B20 Human immunodeficiency virus [HIV] disease: Secondary | ICD-10-CM

## 2016-10-25 DIAGNOSIS — E119 Type 2 diabetes mellitus without complications: Secondary | ICD-10-CM | POA: Diagnosis not present

## 2016-10-25 DIAGNOSIS — Z6841 Body Mass Index (BMI) 40.0 and over, adult: Secondary | ICD-10-CM | POA: Diagnosis not present

## 2016-10-25 DIAGNOSIS — IMO0001 Reserved for inherently not codable concepts without codable children: Secondary | ICD-10-CM

## 2016-10-25 LAB — LIPID PANEL
Cholesterol: 295 mg/dL — ABNORMAL HIGH (ref <200)
HDL: 39 mg/dL — ABNORMAL LOW (ref >40)
LDL Cholesterol: 228 mg/dL — ABNORMAL HIGH (ref <100)
Total CHOL/HDL Ratio: 7.6 Ratio — ABNORMAL HIGH (ref <5.0)
Triglycerides: 138 mg/dL (ref <150)
VLDL: 28 mg/dL (ref <30)

## 2016-10-25 LAB — CBC WITH DIFFERENTIAL/PLATELET
Basophils Absolute: 0 cells/uL (ref 0–200)
Basophils Relative: 0 %
Eosinophils Absolute: 104 cells/uL (ref 15–500)
Eosinophils Relative: 2 %
HCT: 37.4 % — ABNORMAL LOW (ref 38.5–50.0)
Hemoglobin: 12.3 g/dL — ABNORMAL LOW (ref 13.2–17.1)
Lymphocytes Relative: 40 %
Lymphs Abs: 2080 cells/uL (ref 850–3900)
MCH: 27.4 pg (ref 27.0–33.0)
MCHC: 32.9 g/dL (ref 32.0–36.0)
MCV: 83.3 fL (ref 80.0–100.0)
MPV: 9.1 fL (ref 7.5–12.5)
Monocytes Absolute: 312 cells/uL (ref 200–950)
Monocytes Relative: 6 %
Neutro Abs: 2704 cells/uL (ref 1500–7800)
Neutrophils Relative %: 52 %
Platelets: 252 10*3/uL (ref 140–400)
RBC: 4.49 MIL/uL (ref 4.20–5.80)
RDW: 16.1 % — ABNORMAL HIGH (ref 11.0–15.0)
WBC: 5.2 10*3/uL (ref 3.8–10.8)

## 2016-10-25 LAB — COMPLETE METABOLIC PANEL WITH GFR
ALT: 10 U/L (ref 9–46)
AST: 15 U/L (ref 10–35)
Albumin: 4.1 g/dL (ref 3.6–5.1)
Alkaline Phosphatase: 56 U/L (ref 40–115)
BUN: 17 mg/dL (ref 7–25)
CO2: 24 mmol/L (ref 20–31)
Calcium: 9.5 mg/dL (ref 8.6–10.3)
Chloride: 103 mmol/L (ref 98–110)
Creat: 1.1 mg/dL (ref 0.70–1.33)
GFR, Est African American: 89 mL/min (ref >=60)
GFR, Est Non African American: 77 mL/min (ref >=60)
Glucose, Bld: 150 mg/dL — ABNORMAL HIGH (ref 65–99)
Potassium: 4.2 mmol/L (ref 3.5–5.3)
Sodium: 137 mmol/L (ref 135–146)
Total Bilirubin: 0.5 mg/dL (ref 0.2–1.2)
Total Protein: 7.7 g/dL (ref 6.1–8.1)

## 2016-10-25 MED ORDER — DOXYCYCLINE HYCLATE 100 MG PO CAPS: 100.0000 mg | ORAL_CAPSULE | Freq: Two times a day (BID) | ORAL | 0 refills | Status: DC

## 2016-10-25 MED ORDER — OXYCODONE HCL 5 MG PO TABS
10.0000 mg | ORAL_TABLET | Freq: Once | ORAL | Status: AC
  Administered 2016-10-25: 10 mg via ORAL
  Filled 2016-10-25: qty 2

## 2016-10-25 MED ORDER — NAFTIFINE HCL 2 % EX CREA: 1.0000 "application " | TOPICAL_CREAM | Freq: Every day | CUTANEOUS | 0 refills | Status: DC

## 2016-10-25 MED ORDER — CYCLOBENZAPRINE HCL 10 MG PO TABS: 10.0000 mg | ORAL_TABLET | Freq: Two times a day (BID) | ORAL | 0 refills | Status: DC | PRN

## 2016-10-25 NOTE — Discharge Instructions (Signed)
Call the foot doctor and make an appointment. Call you HIV doctor and make an appointment.

## 2016-10-25 NOTE — ED Provider Notes (Signed)
[facility] DEPT Provider Note   CSN: [id number] Arrival date & time: 10/24/16  2151     History   Chief Complaint Chief Complaint  Patient presents with  . Leg Pain    HPI Keith [name] is a 51 y.o. male with hx of Keith [name], Keith [name], Keith [name]. Keith [name] and usually takes Oxycodone 10 mg but has run out of the medication over a week ago. Patient reports that the pain he has tonight is mostly in in left foot. The pain radiates to the left knee and left hip. Patient reports that he tried to cut his toe nails and got to close and cut his skin. He is not supposed to cut his nails. He goes to the "foot" doctor and they have told him they will cut his nails.   The history is provided by the patient. No language interpreter was used.  Leg Pain   This is a new problem. The current episode started more than 1 week ago. The problem occurs constantly. The quality of the pain is described as sharp and constant. The pain is at a severity of 8/10. Associated symptoms include tingling. The symptoms are aggravated by lying down. There has been no history of extremity trauma.    Past Medical History:  Diagnosis Date  . AIDS ([place])   . Keith knee pain    right  . Keith pain   . Diabetes mellitus   . Diabetes type 2, uncontrolled ([place])    HgA1c 17.6 (04/27/2010)  . Erectile dysfunction   . Genital warts   . [name] (human immunodeficiency virus infection) ([place]) 2009   CD4 count 100, VL 13800 (05/01/2010)  . Hypertension   . Osteomyelitis [facility])    h/o hand    Patient Active Problem List   Diagnosis Date Noted  . Dehydration 09/29/2016  . AKI (acute kidney injury) ([place]) 09/28/2016  . Diarrhea with dehydration 09/28/2016  . Nausea vomiting and diarrhea 09/28/2016  . Cellulitis and abscess of hand 06/04/2016  . Cellulitis of left hand 06/04/2016  .  Onychomycosis of multiple toenails with type 2 diabetes mellitus ([place]) 08/29/2015  . MRSA carrier 04/20/2014  . Testosterone deficiency 04/20/2014  . Genital warts 02/22/2014  . 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 Insulin-requiring or dependent type II diabetes mellitus ([place]) 02/04/2014  . Dental anomaly 11/20/2012  . Arthritis of right knee 02/23/2012  . Hyperlipidemia 11/10/2011  . Keith pain 08/07/2011  . Meralgia paraesthetica 04/23/2011  . ERECTILE DYSFUNCTION 08/22/2008  . Essential hypertension 05/19/2008    Past Surgical History:  Procedure Laterality Date  . HERNIA REPAIR    . I&D EXTREMITY Left 08/21/2014   Procedure: INCISION AND DRAINAGE LEFT SMALL FINGER;  Surgeon: Leanora Cover, MD;  Location: [place];  Service: Orthopedics;  Laterality: Left;  . MINOR IRRIGATION AND DEBRIDEMENT OF WOUND Right 04/22/2014   Procedure: IRRIGATION AND DEBRIDEMENT OF RIGHT NECK ABCESS;  Surgeon: Jerrell Belfast, MD;  Location: [place];  Service: ENT;  Laterality: Right;  . MULTIPLE EXTRACTIONS WITH ALVEOLOPLASTY N/A 01/18/2013   Procedure: MULTIPLE EXTRACION 3, 6, 7, 10, 11, 13, 21, 22, 27, 28, 29, 30 WITH ALVEOLOPLASTY;  Surgeon: Gae Bon, DDS;  Location: [place];  Service: Oral Surgery;  Laterality: N/A;       Home Medications    Prior to Admission medications  Medication Sig Start Date End Date Taking? Authorizing Provider  abacavir-dolutegravir-lamiVUDine (TRIUMEQ) 600-50-300 MG tablet TAKE 1 TABLET BY MOUTH DAILY, STOP GENVOYA AND PREZISTA 09/16/16   Ginnie Smart, MD  aspirin EC 81 MG tablet Take 1 tablet (81 mg total) by mouth daily. 10/24/16   Bing Neighbors, FNP  blood glucose meter kit and supplies KIT Dispense based on patient and insurance preference. Use up to four times daily as directed. (FOR ICD-9 250.00, 250.01). 10/24/16   Bing Neighbors, FNP  calamine lotion Apply topically 2 (two) times daily. 09/29/16   Rhetta Mura, MD  cyclobenzaprine (FLEXERIL) 10 MG tablet  Take 1 tablet (10 mg total) by mouth 2 (two) times daily as needed for muscle spasms. 10/25/16   Janne Napoleon, NP  darunavir-cobicistat (PREZCOBIX) 800-150 MG tablet Take 1 tablet by mouth daily. with food 09/16/16   Ginnie Smart, MD  doxycycline (VIBRAMYCIN) 100 MG capsule Take 1 capsule (100 mg total) by mouth 2 (two) times daily. 10/25/16   Janne Napoleon, NP  insulin aspart (NOVOLOG FLEXPEN) 100 UNIT/ML FlexPen Inject 3 Units into the skin 3 (three) times daily with meals. 10/24/16   Bing Neighbors, FNP  Insulin Glargine (LANTUS SOLOSTAR) 100 UNIT/ML Solostar Pen Inject 60 Units into the skin daily at 10 pm. Patient taking differently: Inject 70 Units into the skin daily at 10 pm.  06/01/14   Meredeth Ide, MD  lisinopril (PRINIVIL,ZESTRIL) 5 MG tablet Take 1 tablet (5 mg total) by mouth daily. 10/24/16   Bing Neighbors, FNP  LORazepam (ATIVAN) 1 MG tablet Take 1 tablet (1 mg total) by mouth every 6 (six) hours as needed for anxiety. 01/30/15   Ginnie Smart, MD  Naftifine HCl 2 % CREA Apply 1 application topically daily. 10/25/16   Janne Napoleon, NP  Oxycodone HCl 10 MG TABS Take 1 tablet (10 mg total) by mouth every 8 (eight) hours as needed (moderate or severe pain). 09/29/16   Rhetta Mura, MD    Family History Family History  Problem Relation Age of Onset  . Hypertension Mother   . Arthritis Father   . Hypertension Father   . Hypertension Brother   . Cancer Maternal Grandmother 26       unknown type of cancer  . Depression Paternal Grandmother     Social History Social History  Substance Use Topics  . Smoking status: Never Smoker  . Smokeless tobacco: Never Used  . Alcohol use No     Allergies   Sulfa antibiotics   Review of Systems Review of Systems  Constitutional: Negative for chills and fever.  HENT: Negative.   Eyes: Negative for visual disturbance.  Respiratory: Negative for cough, chest tightness, shortness of breath and wheezing.     Cardiovascular: Negative for chest pain and leg swelling.  Gastrointestinal: Negative for abdominal pain, nausea and vomiting.  Genitourinary: Negative for decreased urine volume, difficulty urinating, discharge, dysuria and urgency.  Musculoskeletal: Positive for arthralgias (left leg pain). Negative for back pain.  Skin: Positive for wound.  Neurological: Positive for tingling. Negative for syncope and headaches.  Psychiatric/Behavioral: The patient is not nervous/anxious.      Physical Exam Updated Vital Signs BP 115/80   Pulse 80   Temp 97.8 F (36.6 C) (Oral)   Resp 16   SpO2 100%   Physical Exam  Constitutional: He appears well-developed and well-nourished. No distress.  HENT:  Head: Normocephalic.  Eyes: EOM are normal.  Neck:  Neck supple.  Cardiovascular: Normal rate.   Pulmonary/Chest: Effort normal.  Musculoskeletal:  Tenderness that extends from the left hip to the toes. Patient's toes are tender on palpation. There is swelling noted with erythema. Patient has tried to cut his toe nails and has cut some of the nails into the skin.  No calf tenderness, adequate circulation, pedal pulse 2+.   Neurological: He is alert. He displays normal reflexes.  Skin: Skin is warm and dry.  Toes of left foot dry with thick nails and cracking of the skin. Appears as tinea. There is erythema of the middle and 4th toe and tenderness with palpation.   Psychiatric: He has a normal mood and affect. His behavior is normal.  Nursing note and vitals reviewed.    ED Treatments / Results  Labs (all labs ordered are listed, but only abnormal results are displayed) Labs Reviewed  CBG MONITORING, ED - Abnormal; Notable for the following:       Result Value   Glucose-Capillary 152 (*)    All other components within normal limits    Radiology No results found.  Procedures Procedures (including critical care time)  Medications Ordered in ED Medications  oxyCODONE (Oxy  IR/ROXICODONE) immediate release tablet 10 mg (10 mg Oral Given 10/25/16 0054)     Initial Impression / Assessment and Plan / ED Course  I have reviewed the triage vital signs and the nursing notes. 2 am discussed with [name]. Wyvonnia Dusky and he will see the patient.   Final Clinical Impressions(s) / ED Diagnoses  51 y.o. male with hx of Keith pain here tonight with left foot, knee and hip pain. Stable for d/c without neuro deficits. [name]. Wyvonnia Dusky in to examine the patient. Will treat with antibiotics for infected toes and antifungal. Discussed with the patient that we can no give oxycodone refill and he will need to discuss Keith pain management with his doctor.  Final diagnoses:  Tinea pedis of left foot  Infected superficial injury of toe of left foot, initial encounter    New Prescriptions New Prescriptions   CYCLOBENZAPRINE (FLEXERIL) 10 MG TABLET    Take 1 tablet (10 mg total) by mouth 2 (two) times daily as needed for muscle spasms.   DOXYCYCLINE (VIBRAMYCIN) 100 MG CAPSULE    Take 1 capsule (100 mg total) by mouth 2 (two) times daily.   NAFTIFINE HCL 2 % CREA    Apply 1 application topically daily.     Debroah Baller [place], [place] 10/25/16 Anola Gurney    Ezequiel Essex, MD 10/25/16 [phone]

## 2016-10-26 LAB — THYROID PANEL WITH TSH
Free Thyroxine Index: 1.8 (ref 1.4–3.8)
T3 Uptake: 30 % (ref 22–35)
T4, Total: 6.1 ug/dL (ref 4.5–12.0)
TSH: 4.83 mIU/L — ABNORMAL HIGH (ref 0.40–4.50)

## 2016-10-28 ENCOUNTER — Encounter: Payer: Self-pay | Admitting: Family Medicine

## 2016-10-28 ENCOUNTER — Ambulatory Visit (INDEPENDENT_AMBULATORY_CARE_PROVIDER_SITE_OTHER): Payer: Medicare Other | Admitting: Family Medicine

## 2016-10-28 VITALS — BP 124/88 | HR 87 | Temp 97.6°F | Resp 16 | Ht 69.0 in | Wt 266.0 lb

## 2016-10-28 DIAGNOSIS — M79652 Pain in left thigh: Secondary | ICD-10-CM

## 2016-10-28 DIAGNOSIS — E782 Mixed hyperlipidemia: Secondary | ICD-10-CM

## 2016-10-28 MED ORDER — LIDOCAINE 5 % EX PTCH: 1.0000 | MEDICATED_PATCH | CUTANEOUS | 0 refills | Status: DC

## 2016-10-28 MED ORDER — LIDOCAINE 5 % EX PTCH: 1.0000 | MEDICATED_PATCH | CUTANEOUS | 1 refills | Status: DC

## 2016-10-28 MED ORDER — PRAVASTATIN SODIUM 40 MG PO TABS: 40.0000 mg | ORAL_TABLET | Freq: Every day | ORAL | 3 refills | Status: DC

## 2016-10-28 NOTE — Progress Notes (Signed)
[name], male    DOB: 06/20/1965, 50 y.o.   MRN: [id number]  PCP: Scot Jun, FNP  Chief Complaint  Patient presents with  . Leg Pain    left    Subjective:  HPI Keith [name] is a 51 y.o. male presents for evaluation of left leg pain. Keith [name] was recently seen and evaluated in the emergency department with the same complaint. He is new to clinic and previously was followed by Dr. Laren Everts. He reports a history of chronic pain  in which he was prescribed oxycodone 10 mg. Keith [name] reports today that he is out of medication and requests a refill. During his initial visit on 10/24/2016, he was advised that his pain medication would not be prescribed by this office and he received a referral for pain management which is pending. Keith [name] reports generalized left leg pain which has been chronic for years. He reports trying gabapentin, ibuprofen, cyclobenzaprine without relief of pain.Denies any injury or prior surgeries.  Social History   Social History  . Marital status: Single    Spouse name: N/A  . Number of children: 42  . Years of education: 12   Occupational History  . Not on file.   Social History Main Topics  . Smoking status: Never Smoker  . Smokeless tobacco: Never Used  . Alcohol use No  . Drug use: No  . Sexual activity: Yes    Partners: Female     Comment: given condoms   Other Topics Concern  . Not on file   Social History Narrative   Worked for the city of [place] for 18 years.   Unemployed.    Applying for disability.   Medicaid patient.    Family History  Problem Relation Age of Onset  . Hypertension Mother   . Arthritis Father   . Hypertension Father   . Hypertension Brother   . Cancer Maternal Grandmother 58       unknown type of cancer  . Depression Paternal Grandmother    Review of Systems See HPI Patient Active Problem List   Diagnosis Date Noted  . Dehydration 09/29/2016  . AKI (acute kidney injury)  ([place]) 09/28/2016  . Diarrhea with dehydration 09/28/2016  . Nausea vomiting and diarrhea 09/28/2016  . Cellulitis and abscess of hand 06/04/2016  . Cellulitis of left hand 06/04/2016  . Onychomycosis of multiple toenails with type 2 diabetes mellitus ([place]) 08/29/2015  . MRSA carrier 04/20/2014  . Testosterone deficiency 04/20/2014  . Genital warts 02/22/2014  . HIV disease ([place])   . Insulin-requiring or dependent type II diabetes mellitus ([place]) 02/04/2014  . Dental anomaly 11/20/2012  . Arthritis of right knee 02/23/2012  . Hyperlipidemia 11/10/2011  . Chronic pain 08/07/2011  . Meralgia paraesthetica 04/23/2011  . ERECTILE DYSFUNCTION 08/22/2008  . Essential hypertension 05/19/2008    Allergies  Allergen Reactions  . Sulfa Antibiotics Itching    Prior to Admission medications   Medication Sig Start Date End Date Taking? Authorizing Provider  abacavir-dolutegravir-lamiVUDine (TRIUMEQ) 600-50-300 MG tablet TAKE 1 TABLET BY MOUTH DAILY, STOP GENVOYA AND PREZISTA 09/16/16  Yes [name], MD  aspirin EC 81 MG tablet Take 1 tablet (81 mg total) by mouth daily. 10/24/16  Yes Scot Jun, FNP  blood glucose meter kit and supplies KIT Dispense based on patient and insurance preference. Use up to four times daily as directed. (FOR ICD-9 250.00, 250.01). 10/24/16  Yes Scot Jun, FNP  calamine lotion  Apply topically 2 (two) times daily. 09/29/16  Yes Nita Sells, MD  cyclobenzaprine (FLEXERIL) 10 MG tablet Take 1 tablet (10 mg total) by mouth 2 (two) times daily as needed for muscle spasms. 10/25/16  Yes Neese, [place], NP  darunavir-cobicistat (PREZCOBIX) 800-150 MG tablet Take 1 tablet by mouth daily. with food 09/16/16  Yes [name], MD  doxycycline (VIBRAMYCIN) 100 MG capsule Take 1 capsule (100 mg total) by mouth 2 (two) times daily. 10/25/16  Yes Neese, Hope M, NP  insulin aspart (NOVOLOG FLEXPEN) 100 UNIT/ML FlexPen Inject 3 Units into the skin 3  (three) times daily with meals. 10/24/16  Yes Scot Jun, FNP  Insulin Glargine (LANTUS SOLOSTAR) 100 UNIT/ML Solostar Pen Inject 60 Units into the skin daily at 10 pm. Patient taking differently: Inject 70 Units into the skin daily at 10 pm.  06/01/14  Yes Darrick Meigs, Marge Duncans, MD  lisinopril (PRINIVIL,ZESTRIL) 5 MG tablet Take 1 tablet (5 mg total) by mouth daily. 10/24/16  Yes Scot Jun, FNP  LORazepam (ATIVAN) 1 MG tablet Take 1 tablet (1 mg total) by mouth every 6 (six) hours as needed for anxiety. 01/30/15  Yes [name], MD  Naftifine HCl 2 % CREA Apply 1 application topically daily. 10/25/16  Yes Neese, [place] M, NP  Oxycodone HCl 10 MG TABS Take 1 tablet (10 mg total) by mouth every 8 (eight) hours as needed (moderate or severe pain). Patient not taking: Reported on 10/28/2016 09/29/16   Nita Sells, MD    Past Medical, Surgical Family and Social History reviewed and updated.    Objective:   Today's Vitals   10/28/16 0811  BP: 124/88  Pulse: 87  Resp: 16  Temp: 97.6 F (36.4 C)  TempSrc: Oral  SpO2: 100%  Weight: 266 lb (120.7 kg)  Height: '5\' 9"'$  (1.753 m)    Wt Readings from Last 3 Encounters:  10/28/16 266 lb (120.7 kg)  10/24/16 269 lb (122 kg)  09/28/16 261 lb 8 oz (118.6 kg)   Physical Exam  Constitutional: He is oriented to person, place, and time. He appears well-developed and well-nourished.  Cardiovascular: Normal rate, regular rhythm, normal heart sounds and intact distal pulses.   Pulmonary/Chest: Effort normal and breath sounds normal.  Musculoskeletal: Normal range of motion. He exhibits no edema, tenderness or deformity.  "subjective left leg pain" without reproducible symptoms  Neurological: He is alert and oriented to person, place, and time.  Skin: Skin is warm and dry.  Psychiatric: He has a normal mood and affect. His behavior is normal. Judgment and thought content normal.   Assessment & Plan:  1. Pain of left thigh -Pain  management referral pending -Acetaminophen 650 mg every 4-6 hours as needed for pain -Lidocaine patches, every 24 hours as needed for pain.  2. Mixed hyperlipidemia -Start Pravachol 40 mg once daily at 1800.  Return for care 11/25/2016-diabetes follow-up   Carroll Sage. Kenton Kingfisher, MSN, FNP-C [facility]  [address] Barbara Cower [place], [place] [address] [phone]

## 2016-10-28 NOTE — Patient Instructions (Addendum)
Tylenol 650 every 4 hours. I am prescribing Lidocaine patches every 12 hours.  Pravachol 40 mg around 1800.   Cholesterol Cholesterol is a fat. Your body needs a small amount of cholesterol. Cholesterol (plaque) may build up in your blood vessels (arteries). That makes you more likely to have a heart attack or stroke. You cannot feel your cholesterol level. Having a blood test is the only way to find out if your level is high. Keep your test results. Work with your doctor to keep your cholesterol at a good level. What do the results mean?  Total cholesterol is how much cholesterol is in your blood.  LDL is bad cholesterol. This is the type that can build up. Try to have low LDL.  HDL is good cholesterol. It cleans your blood vessels and carries LDL away. Try to have high HDL.  Triglycerides are fat that the body can store or burn for energy. What are good levels of cholesterol?  Total cholesterol below 200.  LDL below 100 is good for people who have health risks. LDL below 70 is good for people who have very high risks.  HDL above 40 is good. It is best to have HDL of 60 or higher.  Triglycerides below 150. How can I lower my cholesterol? Diet Follow your diet program as told by your doctor.  Choose fish, white meat chicken, or [place] that is roasted or baked. Try not to eat red meat, fried foods, sausage, or lunch meats.  Eat lots of fresh fruits and vegetables.  Choose whole grains, beans, pasta, potatoes, and cereals.  Choose olive oil, corn oil, or canola oil. Only use small amounts.  Try not to eat butter, mayonnaise, shortening, or palm kernel oils.  Try not to eat foods with trans fats.  Choose low-fat or nonfat dairy foods. ? Drink skim or nonfat milk. ? Eat low-fat or nonfat yogurt and cheeses. ? Try not to drink whole milk or cream. ? Try not to eat ice cream, egg yolks, or full-fat cheeses.  Healthy desserts include angel food cake, ginger snaps, animal  crackers, hard candy, popsicles, and low-fat or nonfat frozen yogurt. Try not to eat pastries, cakes, pies, and cookies.  Exercise Follow your exercise program as told by your doctor.  Be more active. Try gardening, walking, and taking the stairs.  Ask your doctor about ways that you can be more active.  Medicine  Take over-the-counter and prescription medicines only as told by your doctor.  This information is not intended to replace advice given to you by your health care provider. Make sure you discuss any questions you have with your health care provider. Document Released: 06/21/2008 Document Revised: 10/25/2015 Document Reviewed: 10/05/2015 Elsevier Interactive Patient Education  2017 [place] DASH stands for "Dietary Approaches to Stop Hypertension." The DASH eating plan is a healthy eating plan that has been shown to reduce high blood pressure (hypertension). It may also reduce your risk for type 2 diabetes, heart disease, and stroke. The DASH eating plan may also help with weight loss. What are tips for following this plan? General guidelines  Avoid eating more than 2,300 mg (milligrams) of salt (sodium) a day. If you have hypertension, you may need to reduce your sodium intake to 1,500 mg a day.  Limit alcohol intake to no more than 1 drink a day for nonpregnant women and 2 drinks a day for men. One drink equals 12 oz of beer, 5 oz of  wine, or 1 oz of hard liquor.  Work with your health care provider to maintain a healthy body weight or to lose weight. Ask what an ideal weight is for you.  Get at least 30 minutes of exercise that causes your heart to beat faster (aerobic exercise) most days of the week. Activities may include walking, swimming, or biking.  Work with your health care provider or diet and nutrition specialist (dietitian) to adjust your eating plan to your individual calorie needs. Reading food labels  Check food labels for the amount  of sodium per serving. Choose foods with less than 5 percent of the Daily Value of sodium. Generally, foods with less than 300 mg of sodium per serving fit into this eating plan.  To find whole grains, look for the word "whole" as the first word in the ingredient list. Shopping  Buy products labeled as "low-sodium" or "no salt added."  Buy fresh foods. Avoid canned foods and premade or frozen meals. Cooking  Avoid adding salt when cooking. Use salt-free seasonings or herbs instead of table salt or sea salt. Check with your health care provider or pharmacist before using salt substitutes.  Do not fry foods. Cook foods using healthy methods such as baking, boiling, grilling, and broiling instead.  Cook with heart-healthy oils, such as olive, canola, soybean, or sunflower oil. Meal planning   Eat a balanced diet that includes: ? 5 or more servings of fruits and vegetables each day. At each meal, try to fill half of your plate with fruits and vegetables. ? Up to 6-8 servings of whole grains each day. ? Less than 6 oz of lean meat, poultry, or fish each day. A 3-oz serving of meat is about the same size as a deck of cards. One egg equals 1 oz. ? 2 servings of low-fat dairy each day. ? A serving of nuts, seeds, or beans 5 times each week. ? Heart-healthy fats. Healthy fats called Omega-3 fatty acids are found in foods such as flaxseeds and coldwater fish, like sardines, salmon, and mackerel.  Limit how much you eat of the following: ? Canned or prepackaged foods. ? Food that is high in trans fat, such as fried foods. ? Food that is high in saturated fat, such as fatty meat. ? Sweets, desserts, sugary drinks, and other foods with added sugar. ? Full-fat dairy products.  Do not salt foods before eating.  Try to eat at least 2 vegetarian meals each week.  Eat more home-cooked food and less restaurant, buffet, and fast food.  When eating at a restaurant, ask that your food be prepared  with less salt or no salt, if possible. What foods are recommended? The items listed may not be a complete list. Talk with your dietitian about what dietary choices are best for you. Grains Whole-grain or whole-wheat bread. Whole-grain or whole-wheat pasta. Brown rice. [place]. Bulgur. Whole-grain and low-sodium cereals. Pita bread. Low-fat, low-sodium crackers. Whole-wheat flour tortillas. Vegetables Fresh or frozen vegetables (raw, steamed, roasted, or grilled). Low-sodium or reduced-sodium tomato and vegetable juice. Low-sodium or reduced-sodium tomato sauce and tomato paste. Low-sodium or reduced-sodium canned vegetables. Fruits All fresh, dried, or frozen fruit. Canned fruit in natural juice (without added sugar). Meat and other protein foods Skinless chicken or [place]. Ground chicken or [place]. Pork with fat trimmed off. Fish and seafood. Egg whites. Dried beans, peas, or lentils. Unsalted nuts, nut butters, and seeds. Unsalted canned beans. Lean cuts of beef with fat trimmed off. Low-sodium, lean  deli meat. Dairy Low-fat (1%) or fat-free (skim) milk. Fat-free, low-fat, or reduced-fat cheeses. Nonfat, low-sodium ricotta or cottage cheese. Low-fat or nonfat yogurt. Low-fat, low-sodium cheese. Fats and oils Soft margarine without trans fats. Vegetable oil. Low-fat, reduced-fat, or light mayonnaise and salad dressings (reduced-sodium). Canola, safflower, olive, soybean, and sunflower oils. Avocado. Seasoning and other foods Herbs. Spices. Seasoning mixes without salt. Unsalted popcorn and pretzels. Fat-free sweets. What foods are not recommended? The items listed may not be a complete list. Talk with your dietitian about what dietary choices are best for you. Grains Baked goods made with fat, such as croissants, muffins, or some breads. Dry pasta or rice meal packs. Vegetables Creamed or fried vegetables. Vegetables in a cheese sauce. Regular canned vegetables (not low-sodium or  reduced-sodium). Regular canned tomato sauce and paste (not low-sodium or reduced-sodium). Regular tomato and vegetable juice (not low-sodium or reduced-sodium). Angie Fava. Olives. Fruits Canned fruit in a light or heavy syrup. Fried fruit. Fruit in cream or butter sauce. Meat and other protein foods Fatty cuts of meat. Ribs. Fried meat. Berniece Salines. Sausage. Bologna and other processed lunch meats. Salami. Fatback. Hotdogs. Bratwurst. Salted nuts and seeds. Canned beans with added salt. Canned or smoked fish. Whole eggs or egg yolks. Chicken or [place] with skin. Dairy Whole or 2% milk, cream, and half-and-half. Whole or full-fat cream cheese. Whole-fat or sweetened yogurt. Full-fat cheese. Nondairy creamers. Whipped toppings. Processed cheese and cheese spreads. Fats and oils Butter. Stick margarine. Lard. Shortening. Ghee. Bacon fat. Tropical oils, such as coconut, palm kernel, or palm oil. Seasoning and other foods Salted popcorn and pretzels. Onion salt, garlic salt, seasoned salt, table salt, and sea salt. Worcestershire sauce. Tartar sauce. Barbecue sauce. Teriyaki sauce. Soy sauce, including reduced-sodium. Steak sauce. Canned and packaged gravies. Fish sauce. Oyster sauce. Cocktail sauce. Horseradish that you find on the shelf. Ketchup. Mustard. Meat flavorings and tenderizers. Bouillon cubes. Hot sauce and Tabasco sauce. Premade or packaged marinades. Premade or packaged taco seasonings. Relishes. Regular salad dressings. Where to find more information:  National Heart, Lung, and [place]: https://wilson-eaton.com/  American Heart Association: www.heart.org Summary  The DASH eating plan is a healthy eating plan that has been shown to reduce high blood pressure (hypertension). It may also reduce your risk for type 2 diabetes, heart disease, and stroke.  With the DASH eating plan, you should limit salt (sodium) intake to 2,300 mg a day. If you have hypertension, you may need to reduce your sodium  intake to 1,500 mg a day.  When on the DASH eating plan, aim to eat more fresh fruits and vegetables, whole grains, lean proteins, low-fat dairy, and heart-healthy fats.  Work with your health care provider or diet and nutrition specialist (dietitian) to adjust your eating plan to your individual calorie needs. This information is not intended to replace advice given to you by your health care provider. Make sure you discuss any questions you have with your health care provider. Document Released: 03/14/2011 Document Revised: 03/18/2016 Document Reviewed: 03/18/2016 Elsevier Interactive Patient Education  2017 [employer].

## 2016-10-29 ENCOUNTER — Telehealth: Payer: Self-pay

## 2016-10-29 MED ORDER — INSULIN SYRINGES (DISPOSABLE) U-100 0.3 ML MISC: 1 refills | Status: DC

## 2016-10-29 MED ORDER — INSULIN LISPRO 100 UNIT/ML ~~LOC~~ SOLN: 3.0000 [IU] | Freq: Three times a day (TID) | SUBCUTANEOUS | 11 refills | Status: DC

## 2016-10-29 NOTE — Telephone Encounter (Signed)
Novolog is not covered and preferred is Humalog.

## 2016-10-29 NOTE — Telephone Encounter (Signed)
Please fax over insulin syringes and Humalog has been e-prescribed due to medicaid coverage

## 2016-10-30 ENCOUNTER — Telehealth: Payer: Self-pay

## 2016-10-30 MED ORDER — INSULIN LISPRO 100 UNIT/ML (KWIKPEN): 3.0000 [IU] | PEN_INJECTOR | Freq: Three times a day (TID) | SUBCUTANEOUS | 11 refills | Status: DC

## 2016-10-30 NOTE — Telephone Encounter (Signed)
Patient is in a lot of pain and would like to have some medication

## 2016-10-30 NOTE — Addendum Note (Signed)
Addended by: Scot Jun on: 10/30/2016 02:45 PM   Modules accepted: Orders

## 2016-10-30 NOTE — Telephone Encounter (Signed)
Medicaid and Medicare will not cover the Lidocaine Pathces

## 2016-10-31 ENCOUNTER — Encounter (HOSPITAL_COMMUNITY): Payer: Self-pay

## 2016-10-31 ENCOUNTER — Emergency Department (HOSPITAL_COMMUNITY)
Admission: EM | Admit: 2016-10-31 | Discharge: 2016-10-31 | Disposition: A | Discharge status: Home / Self Care | Payer: Medicare Other | Attending: Emergency Medicine | Admitting: Emergency Medicine

## 2016-10-31 DIAGNOSIS — Z794 Long term (current) use of insulin: Secondary | ICD-10-CM | POA: Insufficient documentation

## 2016-10-31 DIAGNOSIS — I10 Essential (primary) hypertension: Secondary | ICD-10-CM | POA: Diagnosis not present

## 2016-10-31 DIAGNOSIS — Z7982 Long term (current) use of aspirin: Secondary | ICD-10-CM | POA: Insufficient documentation

## 2016-10-31 DIAGNOSIS — E1142 Type 2 diabetes mellitus with diabetic polyneuropathy: Secondary | ICD-10-CM | POA: Insufficient documentation

## 2016-10-31 DIAGNOSIS — G8929 Other chronic pain: Secondary | ICD-10-CM | POA: Insufficient documentation

## 2016-10-31 DIAGNOSIS — B20 Human immunodeficiency virus [HIV] disease: Secondary | ICD-10-CM | POA: Diagnosis not present

## 2016-10-31 DIAGNOSIS — M79669 Pain in unspecified lower leg: Secondary | ICD-10-CM | POA: Diagnosis present

## 2016-10-31 DIAGNOSIS — Z79899 Other long term (current) drug therapy: Secondary | ICD-10-CM | POA: Insufficient documentation

## 2016-10-31 DIAGNOSIS — E1342 Other specified diabetes mellitus with diabetic polyneuropathy: Secondary | ICD-10-CM

## 2016-10-31 MED ORDER — GABAPENTIN 300 MG PO CAPS: 300.0000 mg | ORAL_CAPSULE | Freq: Three times a day (TID) | ORAL | 0 refills | Status: DC

## 2016-10-31 MED ORDER — DICLOFENAC SODIUM 1 % TD GEL: 2.0000 g | Freq: Four times a day (QID) | TRANSDERMAL | 3 refills | Status: DC

## 2016-10-31 MED ORDER — TRAMADOL HCL 50 MG PO TABS: 50.0000 mg | ORAL_TABLET | Freq: Four times a day (QID) | ORAL | 0 refills | Status: DC | PRN

## 2016-10-31 MED ORDER — AMITRIPTYLINE HCL 25 MG PO TABS: ORAL_TABLET | ORAL | 0 refills | Status: DC

## 2016-10-31 NOTE — ED Provider Notes (Signed)
[facility] DEPT Provider Note   CSN: [id number] Arrival date & time: 10/31/16  0555   History   Chief Complaint Chief Complaint  Patient presents with  . Leg Pain    HPI Keith [name] is a 51 y.o. male who presents with bilateral leg pain and hand pain. PMH significant for HIV, insulin dependent Type 2 DM, chronic pain. He states that he is a diabetic and he has had worsening neuropathy in his hands and feet extending up his legs. No trauma. He states that he is confused because he thought that if he got a sugar under control his symptoms will get better, and his last A1c was 8.5. He stopped his gabapentin last week because he states it hasn't been helping him. On review of [place] he refilled Oxycodone '10mg'$  on July 10th #90 (30 day supply). He states that he is out of this medicine because he's been taking them more frequently than prescribed because he is in more pain. He set up for an appointment with pain management on August 10.  HPI  Past Medical History:  Diagnosis Date  . AIDS ([place])   . Chronic knee pain    right  . Chronic pain   . Diabetes mellitus   . Diabetes type 2, uncontrolled ([place])    HgA1c 17.6 (04/27/2010)  . Erectile dysfunction   . Genital warts   . HIV (human immunodeficiency virus infection) ([place]) 2009   CD4 count 100, VL 13800 (05/01/2010)  . Hypertension   . Osteomyelitis [facility])    h/o hand    Patient Active Problem List   Diagnosis Date Noted  . Dehydration 09/29/2016  . AKI (acute kidney injury) ([place]) 09/28/2016  . Diarrhea with dehydration 09/28/2016  . Nausea vomiting and diarrhea 09/28/2016  . Cellulitis and abscess of hand 06/04/2016  . Cellulitis of left hand 06/04/2016  . Onychomycosis of multiple toenails with type 2 diabetes mellitus ([place]) 08/29/2015  . MRSA carrier 04/20/2014  . Testosterone deficiency 04/20/2014  . Genital warts 02/22/2014  . HIV disease ([place])   . Insulin-requiring or dependent type II diabetes mellitus ([place])  02/04/2014  . Dental anomaly 11/20/2012  . Arthritis of right knee 02/23/2012  . Hyperlipidemia 11/10/2011  . Chronic pain 08/07/2011  . Meralgia paraesthetica 04/23/2011  . ERECTILE DYSFUNCTION 08/22/2008  . Essential hypertension 05/19/2008    Past Surgical History:  Procedure Laterality Date  . HERNIA REPAIR    . I&D EXTREMITY Left 08/21/2014   Procedure: INCISION AND DRAINAGE LEFT SMALL FINGER;  Surgeon: Leanora Cover, MD;  Location: [facility];  Service: Orthopedics;  Laterality: Left;  . MINOR IRRIGATION AND DEBRIDEMENT OF WOUND Right 04/22/2014   Procedure: IRRIGATION AND DEBRIDEMENT OF RIGHT NECK ABCESS;  Surgeon: Jerrell Belfast, MD;  Location: [facility];  Service: ENT;  Laterality: Right;  . MULTIPLE EXTRACTIONS WITH ALVEOLOPLASTY N/A 01/18/2013   Procedure: MULTIPLE EXTRACION 3, 6, 7, 10, 11, 13, 21, 22, 27, 28, 29, 30 WITH ALVEOLOPLASTY;  Surgeon: Gae Bon, DDS;  Location: [facility];  Service: Oral Surgery;  Laterality: N/A;       Home Medications    Prior to Admission medications   Medication Sig Start Date End Date Taking? Authorizing Provider  abacavir-dolutegravir-lamiVUDine (TRIUMEQ) 600-50-300 MG tablet TAKE 1 TABLET BY MOUTH DAILY, STOP GENVOYA AND PREZISTA 09/16/16   [name], MD  amitriptyline (ELAVIL) 25 MG tablet Take 1 tablet at bedtime for 7 days.  Increase dose to 2 tablets at bedtime daily at bedtime.  10/31/16   Scot Jun, FNP  aspirin EC 81 MG tablet Take 1 tablet (81 mg total) by mouth daily. 10/24/16   Scot Jun, FNP  blood glucose meter kit and supplies KIT Dispense based on patient and insurance preference. Use up to four times daily as directed. (FOR ICD-9 250.00, 250.01). 10/24/16   Scot Jun, FNP  calamine lotion Apply topically 2 (two) times daily. 09/29/16   Nita Sells, MD  cyclobenzaprine (FLEXERIL) 10 MG tablet Take 1 tablet (10 mg total) by mouth 2 (two) times daily as needed for muscle spasms. 10/25/16   Ashley Murrain, NP  darunavir-cobicistat (PREZCOBIX) 800-150 MG tablet Take 1 tablet by mouth daily. with food 09/16/16   [name], MD  diclofenac sodium (VOLTAREN) 1 % GEL Apply 2 g topically 4 (four) times daily. 10/31/16   Scot Jun, FNP  doxycycline (VIBRAMYCIN) 100 MG capsule Take 1 capsule (100 mg total) by mouth 2 (two) times daily. 10/25/16   Ashley Murrain, NP  Insulin Glargine (LANTUS SOLOSTAR) 100 UNIT/ML Solostar Pen Inject 60 Units into the skin daily at 10 pm. Patient taking differently: Inject 70 Units into the skin daily at 10 pm.  06/01/14   [name], MD  insulin lispro (HUMALOG KWIKPEN) 100 UNIT/ML KiwkPen Inject 0.03 mLs (3 Units total) into the skin 3 (three) times daily. 10/30/16   Scot Jun, FNP  Insulin Syringes, Disposable, U-100 0.3 ML MISC For administration of insulin TID. ICD E-11.9 10/29/16   Scot Jun, FNP  lidocaine (LIDODERM) 5 % Place 1 patch onto the skin daily. Remove & Discard patch within 12 hours. 10/28/16   Scot Jun, FNP  lisinopril (PRINIVIL,ZESTRIL) 5 MG tablet Take 1 tablet (5 mg total) by mouth daily. 10/24/16   Scot Jun, FNP  LORazepam (ATIVAN) 1 MG tablet Take 1 tablet (1 mg total) by mouth every 6 (six) hours as needed for anxiety. 01/30/15   [name], MD  Naftifine HCl 2 % CREA Apply 1 application topically daily. 10/25/16   Ashley Murrain, NP  Oxycodone HCl 10 MG TABS Take 1 tablet (10 mg total) by mouth every 8 (eight) hours as needed (moderate or severe pain). Patient not taking: Reported on 10/28/2016 09/29/16   Nita Sells, MD  pravastatin (PRAVACHOL) 40 MG tablet Take 1 tablet (40 mg total) by mouth daily. 10/28/16   Scot Jun, FNP    Family History Family History  Problem Relation Age of Onset  . Hypertension Mother   . Arthritis Father   . Hypertension Father   . Hypertension Brother   . Cancer Maternal Grandmother 27       unknown type of cancer  . Depression Paternal  Grandmother     Social History Social History  Substance Use Topics  . Smoking status: Never Smoker  . Smokeless tobacco: Never Used  . Alcohol use No     Allergies   Sulfa antibiotics   Review of Systems Review of Systems  Musculoskeletal: Positive for arthralgias. Negative for gait problem.  Skin: Positive for wound (Bloody toenail).  Neurological: Positive for numbness (tingling).     Physical Exam Updated Vital Signs BP (!) 144/98 (BP Location: Right Arm)   Pulse 83   Temp (!) 97.3 F (36.3 C) (Oral)   Resp (!) 100   SpO2 100%   Physical Exam  Constitutional: He is oriented to person, place, and time. He appears well-developed and well-nourished.  No distress.  HENT:  Head: Normocephalic and atraumatic.  Eyes: Pupils are equal, round, and reactive to light. Conjunctivae are normal. Right eye exhibits no discharge. Left eye exhibits no discharge. No scleral icterus.  Neck: Normal range of motion.  Cardiovascular: Normal rate.   Pulmonary/Chest: Effort normal. No respiratory distress.  Abdominal: He exhibits no distension.  Neurological: He is alert and oriented to person, place, and time.  Subjective tingling of hands and feet  Skin: Skin is warm and dry.  Generalized xerosis. Onychomycosis of fingernails and toenails. Left great toe nail is bloody from patient picking off his toenail.   Psychiatric: He has a normal mood and affect. His behavior is normal.  Nursing note and vitals reviewed.    ED Treatments / Results  Labs (all labs ordered are listed, but only abnormal results are displayed) Labs Reviewed - No data to display  EKG  EKG Interpretation None       Radiology No results found.  Procedures Procedures (including critical care time)  Medications Ordered in ED Medications - No data to display   Initial Impression / Assessment and Plan / ED Course  I have reviewed the triage vital signs and the nursing notes.  Pertinent labs &  imaging results that were available during my care of the patient were reviewed by me and considered in my medical decision making (see chart for details).  51 year old male with chronic pain and uncontrolled DM presents with worsening of his neuropathy. He is requesting medicine to help him get him to his pain management appt on August 10th. Will rx Gabapentin and Tramadol. Advised f/u with podiatry and pain management. He verbalized understanding.  Final Clinical Impressions(s) / ED Diagnoses   Final diagnoses:  Diabetic polyneuropathy associated with other specified diabetes mellitus ([place])  Other chronic pain    New Prescriptions New Prescriptions   No medications on file     Iris Pert 59/97/74 1423    Delora Fuel, MD 95/32/02 [phone]

## 2016-10-31 NOTE — Telephone Encounter (Signed)
I have e-prescribed Amitriptyline and Diclofenac gel for pain. He will need to take medication as directed.

## 2016-10-31 NOTE — Telephone Encounter (Signed)
Patient notified

## 2016-10-31 NOTE — ED Triage Notes (Signed)
Pt here for leg pain bilaterally, denies injury, seen last week for same.

## 2016-11-07 ENCOUNTER — Telehealth: Payer: Self-pay | Admitting: *Deleted

## 2016-11-07 NOTE — Telephone Encounter (Signed)
Patient called requesting help with filling out his forms for pain management. Assisted with filling out forms according to his health history. Patient can not read or write. Myrtis Hopping

## 2016-11-18 ENCOUNTER — Encounter (HOSPITAL_COMMUNITY): Payer: Self-pay

## 2016-11-18 ENCOUNTER — Emergency Department (HOSPITAL_COMMUNITY)
Admission: EM | Admit: 2016-11-18 | Discharge: 2016-11-18 | Disposition: A | Discharge status: Home / Self Care | Payer: Medicare Other | Attending: Emergency Medicine | Admitting: Emergency Medicine

## 2016-11-18 DIAGNOSIS — I10 Essential (primary) hypertension: Secondary | ICD-10-CM | POA: Diagnosis not present

## 2016-11-18 DIAGNOSIS — M25561 Pain in right knee: Secondary | ICD-10-CM | POA: Insufficient documentation

## 2016-11-18 DIAGNOSIS — E119 Type 2 diabetes mellitus without complications: Secondary | ICD-10-CM | POA: Insufficient documentation

## 2016-11-18 DIAGNOSIS — Z794 Long term (current) use of insulin: Secondary | ICD-10-CM | POA: Diagnosis not present

## 2016-11-18 DIAGNOSIS — Z79899 Other long term (current) drug therapy: Secondary | ICD-10-CM | POA: Diagnosis not present

## 2016-11-18 DIAGNOSIS — G894 Chronic pain syndrome: Secondary | ICD-10-CM | POA: Diagnosis not present

## 2016-11-18 DIAGNOSIS — G8929 Other chronic pain: Secondary | ICD-10-CM | POA: Insufficient documentation

## 2016-11-18 DIAGNOSIS — I959 Hypotension, unspecified: Secondary | ICD-10-CM | POA: Diagnosis not present

## 2016-11-18 DIAGNOSIS — Z7982 Long term (current) use of aspirin: Secondary | ICD-10-CM | POA: Diagnosis not present

## 2016-11-18 DIAGNOSIS — B20 Human immunodeficiency virus [HIV] disease: Secondary | ICD-10-CM | POA: Insufficient documentation

## 2016-11-18 DIAGNOSIS — N3001 Acute cystitis with hematuria: Secondary | ICD-10-CM | POA: Insufficient documentation

## 2016-11-18 DIAGNOSIS — R531 Weakness: Secondary | ICD-10-CM | POA: Insufficient documentation

## 2016-11-18 LAB — URINALYSIS, ROUTINE W REFLEX MICROSCOPIC
Bilirubin Urine: NEGATIVE
Glucose, UA: NEGATIVE mg/dL
Ketones, ur: NEGATIVE mg/dL
Nitrite: POSITIVE — AB
Protein, ur: 100 mg/dL — AB
Specific Gravity, Urine: 1.019 (ref 1.005–1.030)
pH: 6 (ref 5.0–8.0)

## 2016-11-18 LAB — BASIC METABOLIC PANEL
Anion gap: 8 (ref 5–15)
BUN: 24 mg/dL — ABNORMAL HIGH (ref 6–20)
CO2: 25 mmol/L (ref 22–32)
Calcium: 9.7 mg/dL (ref 8.9–10.3)
Chloride: 106 mmol/L (ref 101–111)
Creatinine, Ser: 1.39 mg/dL — ABNORMAL HIGH (ref 0.61–1.24)
GFR calc Af Amer: >60 mL/min (ref >60)
GFR calc non Af Amer: 57 mL/min — ABNORMAL LOW (ref >60)
Glucose, Bld: 212 mg/dL — ABNORMAL HIGH (ref 65–99)
Potassium: 4.3 mmol/L (ref 3.5–5.1)
Sodium: 139 mmol/L (ref 135–145)

## 2016-11-18 LAB — CBG MONITORING, ED: Glucose-Capillary: 216 mg/dL — ABNORMAL HIGH (ref 65–99)

## 2016-11-18 LAB — CBC
HCT: 35.8 % — ABNORMAL LOW (ref 39.0–52.0)
Hemoglobin: 11.9 g/dL — ABNORMAL LOW (ref 13.0–17.0)
MCH: 27.4 pg (ref 26.0–34.0)
MCHC: 33.2 g/dL (ref 30.0–36.0)
MCV: 82.3 fL (ref 78.0–100.0)
Platelets: 265 10*3/uL (ref 150–400)
RBC: 4.35 MIL/uL (ref 4.22–5.81)
RDW: 14.5 % (ref 11.5–15.5)
WBC: 7.5 10*3/uL (ref 4.0–10.5)

## 2016-11-18 MED ORDER — CIPROFLOXACIN HCL 500 MG PO TABS: 500.0000 mg | ORAL_TABLET | Freq: Two times a day (BID) | ORAL | 0 refills | Status: DC

## 2016-11-18 MED ORDER — AZITHROMYCIN 250 MG PO TABS
1000.0000 mg | ORAL_TABLET | Freq: Once | ORAL | Status: AC
  Administered 2016-11-18: 1000 mg via ORAL
  Filled 2016-11-18: qty 4

## 2016-11-18 MED ORDER — CEFTRIAXONE SODIUM 250 MG IJ SOLR
250.0000 mg | Freq: Once | INTRAMUSCULAR | Status: AC
  Administered 2016-11-18: 250 mg via INTRAMUSCULAR
  Filled 2016-11-18: qty 250

## 2016-11-18 MED ORDER — LIDOCAINE HCL (PF) 1 % IJ SOLN
INTRAMUSCULAR | Status: AC
  Filled 2016-11-18: qty 5

## 2016-11-18 NOTE — ED Triage Notes (Signed)
Pt reports he feels weak and its because his sugar is high. When I asked what his cbg was at home pt states he didn't check it this morning but its always high. Pt endorses dizziness. Denies n/v/d.  CBG 216 in triage. BP 89/66

## 2016-11-18 NOTE — Discharge Instructions (Signed)
Take 2 over the counter ibuprofen tablets 3 times a day or 1 over-the-counter naproxen tablets twice a day for pain. Also take tylenol 1000mg (2 extra strength) four times a day.

## 2016-11-18 NOTE — ED Provider Notes (Signed)
[facility] DEPT Provider Note   CSN: [id number] Arrival date & time: 11/18/16  1231     History   Chief Complaint Chief Complaint  Patient presents with  . Weakness  . Hypotension    HPI Keith [name] is a 51 y.o. male.  51 yo M with a chief complaint of weakness. Going on for the past couple days. His only associated symptom is a decreased appetite. Denies fevers or chills denies vomiting or diarrhea denies chest pain shortness breath denies dysuria or increased frequency or hesitancy. Denies rectal pain. States he's been compliant with his medications. He is also complaining of right knee pain.   The history is provided by the patient.  Illness  This is a new problem. The current episode started 2 days ago. The problem occurs constantly. The problem has not changed since onset.Pertinent negatives include no chest pain, no abdominal pain, no headaches and no shortness of breath. Nothing aggravates the symptoms. Nothing relieves the symptoms. He has tried nothing for the symptoms. The treatment provided no relief.    Past Medical History:  Diagnosis Date  . AIDS ([place])   . Chronic knee pain    right  . Chronic pain   . Diabetes mellitus   . Diabetes type 2, uncontrolled ([place])    HgA1c 17.6 (04/27/2010)  . Erectile dysfunction   . Genital warts   . HIV (human immunodeficiency virus infection) ([place]) 2009   CD4 count 100, VL 13800 (05/01/2010)  . Hypertension   . Osteomyelitis [facility])    h/o hand    Patient Active Problem List   Diagnosis Date Noted  . Dehydration 09/29/2016  . AKI (acute kidney injury) ([place]) 09/28/2016  . Diarrhea with dehydration 09/28/2016  . Nausea vomiting and diarrhea 09/28/2016  . Cellulitis and abscess of hand 06/04/2016  . Cellulitis of left hand 06/04/2016  . Onychomycosis of multiple toenails with type 2 diabetes mellitus ([place]) 08/29/2015  . MRSA carrier 04/20/2014  . Testosterone deficiency 04/20/2014  . Genital warts 02/22/2014  .  HIV disease ([place])   . Insulin-requiring or dependent type II diabetes mellitus ([place]) 02/04/2014  . Dental anomaly 11/20/2012  . Arthritis of right knee 02/23/2012  . Hyperlipidemia 11/10/2011  . Chronic pain 08/07/2011  . Meralgia paraesthetica 04/23/2011  . ERECTILE DYSFUNCTION 08/22/2008  . Essential hypertension 05/19/2008    Past Surgical History:  Procedure Laterality Date  . HERNIA REPAIR    . I&D EXTREMITY Left 08/21/2014   Procedure: INCISION AND DRAINAGE LEFT SMALL FINGER;  Surgeon: Leanora Cover, MD;  Location: [place];  Service: Orthopedics;  Laterality: Left;  . MINOR IRRIGATION AND DEBRIDEMENT OF WOUND Right 04/22/2014   Procedure: IRRIGATION AND DEBRIDEMENT OF RIGHT NECK ABCESS;  Surgeon: Jerrell Belfast, MD;  Location: [place];  Service: ENT;  Laterality: Right;  . MULTIPLE EXTRACTIONS WITH ALVEOLOPLASTY N/A 01/18/2013   Procedure: MULTIPLE EXTRACION 3, 6, 7, 10, 11, 13, 21, 22, 27, 28, 29, 30 WITH ALVEOLOPLASTY;  Surgeon: Gae Bon, DDS;  Location: [place];  Service: Oral Surgery;  Laterality: N/A;       Home Medications    Prior to Admission medications   Medication Sig Start Date End Date Taking? Authorizing Provider  abacavir-dolutegravir-lamiVUDine (TRIUMEQ) 600-50-300 MG tablet TAKE 1 TABLET BY MOUTH DAILY, STOP GENVOYA AND PREZISTA 09/16/16   [name], MD  amitriptyline (ELAVIL) 25 MG tablet Take 1 tablet at bedtime for 7 days.  Increase dose to 2 tablets at bedtime daily at bedtime.  10/31/16   Scot Jun, FNP  aspirin EC 81 MG tablet Take 1 tablet (81 mg total) by mouth daily. 10/24/16   Scot Jun, FNP  blood glucose meter kit and supplies KIT Dispense based on patient and insurance preference. Use up to four times daily as directed. (FOR ICD-9 250.00, 250.01). 10/24/16   Scot Jun, FNP  calamine lotion Apply topically 2 (two) times daily. 09/29/16   Nita Sells, MD  ciprofloxacin (CIPRO) 500 MG tablet Take 1 tablet (500 mg  total) by mouth 2 (two) times daily. 11/18/16   Deno Etienne, DO  cyclobenzaprine (FLEXERIL) 10 MG tablet Take 1 tablet (10 mg total) by mouth 2 (two) times daily as needed for muscle spasms. 10/25/16   Ashley Murrain, NP  darunavir-cobicistat (PREZCOBIX) 800-150 MG tablet Take 1 tablet by mouth daily. with food 09/16/16   [name], MD  diclofenac sodium (VOLTAREN) 1 % GEL Apply 2 g topically 4 (four) times daily. 10/31/16   Scot Jun, FNP  doxycycline (VIBRAMYCIN) 100 MG capsule Take 1 capsule (100 mg total) by mouth 2 (two) times daily. 10/25/16   Ashley Murrain, NP  gabapentin (NEURONTIN) 300 MG capsule Take 1 capsule (300 mg total) by mouth 3 (three) times daily. 10/31/16   Recardo Evangelist, PA-C  Insulin Glargine (LANTUS SOLOSTAR) 100 UNIT/ML Solostar Pen Inject 60 Units into the skin daily at 10 pm. Patient taking differently: Inject 70 Units into the skin daily at 10 pm.  06/01/14   [name], MD  insulin lispro (HUMALOG KWIKPEN) 100 UNIT/ML KiwkPen Inject 0.03 mLs (3 Units total) into the skin 3 (three) times daily. 10/30/16   Scot Jun, FNP  Insulin Syringes, Disposable, U-100 0.3 ML MISC For administration of insulin TID. ICD E-11.9 10/29/16   Scot Jun, FNP  lidocaine (LIDODERM) 5 % Place 1 patch onto the skin daily. Remove & Discard patch within 12 hours. 10/28/16   Scot Jun, FNP  lisinopril (PRINIVIL,ZESTRIL) 5 MG tablet Take 1 tablet (5 mg total) by mouth daily. 10/24/16   Scot Jun, FNP  LORazepam (ATIVAN) 1 MG tablet Take 1 tablet (1 mg total) by mouth every 6 (six) hours as needed for anxiety. 01/30/15   [name], MD  Naftifine HCl 2 % CREA Apply 1 application topically daily. 10/25/16   Ashley Murrain, NP  Oxycodone HCl 10 MG TABS Take 1 tablet (10 mg total) by mouth every 8 (eight) hours as needed (moderate or severe pain). Patient not taking: Reported on 10/28/2016 09/29/16   Nita Sells, MD  pravastatin (PRAVACHOL) 40  MG tablet Take 1 tablet (40 mg total) by mouth daily. 10/28/16   Scot Jun, FNP  traMADol (ULTRAM) 50 MG tablet Take 1 tablet (50 mg total) by mouth every 6 (six) hours as needed. 10/31/16   Recardo Evangelist, PA-C    Family History Family History  Problem Relation Age of Onset  . Hypertension Mother   . Arthritis Father   . Hypertension Father   . Hypertension Brother   . Cancer Maternal Grandmother 16       unknown type of cancer  . Depression Paternal Grandmother     Social History Social History  Substance Use Topics  . Smoking status: Never Smoker  . Smokeless tobacco: Never Used  . Alcohol use No     Allergies   Sulfa antibiotics   Review of Systems Review of Systems  Constitutional: Negative  for chills and fever.  HENT: Negative for congestion and facial swelling.   Eyes: Negative for discharge and visual disturbance.  Respiratory: Negative for shortness of breath.   Cardiovascular: Negative for chest pain and palpitations.  Gastrointestinal: Negative for abdominal pain, diarrhea and vomiting.  Musculoskeletal: Negative for arthralgias and myalgias.  Skin: Negative for color change and rash.  Neurological: Positive for weakness. Negative for tremors, syncope and headaches.  Psychiatric/Behavioral: Negative for confusion and dysphoric mood.     Physical Exam Updated Vital Signs BP (!) 154/97   Pulse 86   Temp 98.4 F (36.9 C) (Oral)   Resp 16   Ht 5' 10" (1.778 m)   Wt 117.9 kg (260 lb)   SpO2 98%   BMI 37.31 kg/m   Physical Exam  Constitutional: He is oriented to person, place, and time. He appears well-developed and well-nourished.  HENT:  Head: Normocephalic and atraumatic.  Eyes: Pupils are equal, round, and reactive to light. EOM are normal.  Neck: Normal range of motion. Neck supple. No JVD present.  Cardiovascular: Normal rate and regular rhythm.  Exam reveals no gallop and no friction rub.   No murmur heard. Pulmonary/Chest: No  respiratory distress. He has no wheezes.  Abdominal: He exhibits no distension and no mass. There is no tenderness. There is no rebound and no guarding.  Musculoskeletal: Normal range of motion.  Neurological: He is alert and oriented to person, place, and time.  Skin: No rash noted. No pallor.  Psychiatric: He has a normal mood and affect. His behavior is normal.  Nursing note and vitals reviewed.    ED Treatments / Results  Labs (all labs ordered are listed, but only abnormal results are displayed) Labs Reviewed  BASIC METABOLIC PANEL - Abnormal; Notable for the following:       Result Value   Glucose, Bld 212 (*)    BUN 24 (*)    Creatinine, Ser 1.39 (*)    GFR calc non Af Amer 57 (*)    All other components within normal limits  CBC - Abnormal; Notable for the following:    Hemoglobin 11.9 (*)    HCT 35.8 (*)    All other components within normal limits  URINALYSIS, ROUTINE W REFLEX MICROSCOPIC - Abnormal; Notable for the following:    APPearance CLOUDY (*)    Hgb urine dipstick SMALL (*)    Protein, ur 100 (*)    Nitrite POSITIVE (*)    Leukocytes, UA MODERATE (*)    Bacteria, UA MANY (*)    Squamous Epithelial / LPF 0-5 (*)    All other components within normal limits  CBG MONITORING, ED - Abnormal; Notable for the following:    Glucose-Capillary 216 (*)    All other components within normal limits  URINE CULTURE  RPR  CBG MONITORING, ED  GC/CHLAMYDIA PROBE AMP ([place]) NOT AT [facility]    EKG  EKG Interpretation  Date/Time:  Monday November 18 2016 13:36:20 EDT Ventricular Rate:  82 PR Interval:  158 QRS Duration: 104 QT Interval:  388 QTC Calculation: 453 R Axis:   13 Text Interpretation:  Normal sinus rhythm Normal ECG No significant change since last tracing Confirmed by Deno Etienne [phone]) on 11/18/2016 3:31:52 PM       Radiology No results found.  Procedures Procedures (including critical care time)  Medications Ordered in ED Medications    cefTRIAXone (ROCEPHIN) injection 250 mg (not administered)  azithromycin (ZITHROMAX) tablet 1,000 mg (not administered)  Initial Impression / Assessment and Plan / ED Course  I have reviewed the triage vital signs and the nursing notes.  Pertinent labs & imaging results that were available during my care of the patient were reviewed by me and considered in my medical decision making (see chart for details).     51 yo M with a chief complaint of weakness. Is unable to provide any other symptomatology. His laboratory workup looks mostly at baseline. He does have what appears to be a urinary tract infection. Discussed with him that this is an unlikely finding in males and that a CT or prostatitis for the most common causes. The patient denies any risky sexual activity. Despite that I will treat him presumptively for GC. Start on Cipro for possible prostatitis. Him follow with urology as well as his PCP.  The patient was transiently hypotensive as well. This resolved without any intervention. On the room his blood pressure is in the 150s. I'm unsure of the etiology of this initial blood pressure than the patient is well-appearing and nontoxic and is requesting to go home.  3:55 PM:  I have discussed the diagnosis/risks/treatment options with the patient and believe the pt to be eligible for discharge home to follow-up with PCP, urology. We also discussed returning to the ED immediately if new or worsening sx occur. We discussed the sx which are most concerning (e.g., sudden worsening pain, fever, inability to tolerate by mouth) that necessitate immediate return. Medications administered to the patient during their visit and any new prescriptions provided to the patient are listed below.  Medications given during this visit Medications  cefTRIAXone (ROCEPHIN) injection 250 mg (not administered)  azithromycin (ZITHROMAX) tablet 1,000 mg (not administered)     The patient appears reasonably  screen and/or stabilized for discharge and I doubt any other medical condition or other [facility] requiring further screening, evaluation, or treatment in the ED at this time prior to discharge.    Final Clinical Impressions(s) / ED Diagnoses   Final diagnoses:  Weakness  Acute cystitis with hematuria  Chronic pain of right knee    New Prescriptions New Prescriptions   CIPROFLOXACIN (CIPRO) 500 MG TABLET    Take 1 tablet (500 mg total) by mouth 2 (two) times daily.     Deno Etienne, DO 11/18/16 1555

## 2016-11-19 LAB — RPR: RPR Ser Ql: NONREACTIVE

## 2016-11-20 LAB — GC/CHLAMYDIA PROBE AMP (~~LOC~~) NOT AT ARMC
Chlamydia: NEGATIVE
Neisseria Gonorrhea: NEGATIVE

## 2016-11-20 LAB — URINE CULTURE: Culture: >=100000 — AB

## 2016-11-21 ENCOUNTER — Telehealth: Payer: Self-pay | Admitting: *Deleted

## 2016-11-21 DIAGNOSIS — G894 Chronic pain syndrome: Secondary | ICD-10-CM | POA: Diagnosis not present

## 2016-11-21 DIAGNOSIS — Z79891 Long term (current) use of opiate analgesic: Secondary | ICD-10-CM | POA: Diagnosis not present

## 2016-11-21 DIAGNOSIS — M545 Low back pain: Secondary | ICD-10-CM | POA: Diagnosis not present

## 2016-11-21 DIAGNOSIS — M1710 Unilateral primary osteoarthritis, unspecified knee: Secondary | ICD-10-CM | POA: Diagnosis not present

## 2016-11-21 DIAGNOSIS — Z79899 Other long term (current) drug therapy: Secondary | ICD-10-CM | POA: Diagnosis not present

## 2016-11-21 DIAGNOSIS — G629 Polyneuropathy, unspecified: Secondary | ICD-10-CM | POA: Diagnosis not present

## 2016-11-21 NOTE — Progress Notes (Signed)
ED Antimicrobial Stewardship Positive Culture Follow Up   Keith [name] is an 51 y.o. male who presented to [facility] on 11/18/2016 with a chief complaint of  Chief Complaint  Patient presents with  . Weakness  . Hypotension    Recent Results (from the past 720 hour(s))  Urine culture     Status: Abnormal   Collection Time: 11/18/16  2:29 PM  Result Value Ref Range Status   Specimen Description URINE, CLEAN CATCH  Final   Special Requests NONE  Final   Culture (A)  Final    >=100,000 COLONIES/mL VIRIDANS STREPTOCOCCUS CALL MICROBIOLOGY LAB IF SENSITIVITIES ARE REQUIRED.    Report Status 11/20/2016 FINAL  Final   Plan: Follow up with patient to see if he has any symptoms of UTI or prostatitis (tenderness, pain with urination, urgency, frequency, etc). If not, STOP ciprofloxacin.  ED Provider: [place] Russo, PA-C   Mila Merry Gerarda Fraction, PharmD PGY1 Pharmacy Resident Pager: [phone]

## 2016-11-21 NOTE — Telephone Encounter (Signed)
Post ED Visit - Positive Culture Follow-up  Culture report reviewed by antimicrobial stewardship pharmacist:  []  Elenor Quinones, Pharm.D. []  Heide Guile, Pharm.D., BCPS AQ-ID []  Parks Neptune, Pharm.D., BCPS []  Alycia Rossetti, Pharm.D., BCPS []  [employer], Pharm.D., BCPS, AAHIVP []  Legrand Como, Pharm.D., BCPS, AAHIVP []  Salome Arnt, PharmD, BCPS []  Dimitri Ped, PharmD, BCPS []  Vincenza Hews, PharmD, BCPS Leeroy Cha, PharmD  Positive urine culture, reviewed by Martinique Russo, PharmD, per patient is being followed by urology at this time.  Harlon Flor [facility] 11/21/2016, 11:58 AM

## 2016-11-25 ENCOUNTER — Ambulatory Visit: Payer: Medicare Other | Admitting: Infectious Diseases

## 2016-11-25 ENCOUNTER — Ambulatory Visit: Payer: Medicare Other | Admitting: Family Medicine

## 2016-11-26 ENCOUNTER — Ambulatory Visit (INDEPENDENT_AMBULATORY_CARE_PROVIDER_SITE_OTHER): Payer: Medicare Other | Admitting: Family Medicine

## 2016-11-26 VITALS — BP 142/90 | HR 79 | Temp 98.6°F | Resp 16 | Ht 70.0 in | Wt 273.4 lb

## 2016-11-26 DIAGNOSIS — Z794 Long term (current) use of insulin: Secondary | ICD-10-CM

## 2016-11-26 DIAGNOSIS — E118 Type 2 diabetes mellitus with unspecified complications: Secondary | ICD-10-CM | POA: Diagnosis not present

## 2016-11-26 LAB — GLUCOSE, CAPILLARY: Glucose-Capillary: 112 mg/dL — ABNORMAL HIGH (ref 65–99)

## 2016-11-26 MED ORDER — METFORMIN HCL 500 MG PO TABS: 1000.0000 mg | ORAL_TABLET | Freq: Two times a day (BID) | ORAL | 3 refills | Status: DC

## 2016-11-26 NOTE — Patient Instructions (Addendum)
I am adding Metformin 1000 mg twice daily with food. Continue Lantus and Humalog      Diabetes Mellitus and Food It is important for you to manage your blood sugar (glucose) level. Your blood glucose level can be greatly affected by what you eat. Eating healthier foods in the appropriate amounts throughout the day at about the same time each day will help you control your blood glucose level. It can also help slow or prevent worsening of your diabetes mellitus. Healthy eating may even help you improve the level of your blood pressure and reach or maintain a healthy weight. General recommendations for healthful eating and cooking habits include:  Eating meals and snacks regularly. Avoid going long periods of time without eating to lose weight.  Eating a diet that consists mainly of plant-based foods, such as fruits, vegetables, nuts, legumes, and whole grains.  Using low-heat cooking methods, such as baking, instead of high-heat cooking methods, such as deep frying.  Work with your dietitian to make sure you understand how to use the Nutrition Facts information on food labels. How can food affect me? Carbohydrates Carbohydrates affect your blood glucose level more than any other type of food. Your dietitian will help you determine how many carbohydrates to eat at each meal and teach you how to count carbohydrates. Counting carbohydrates is important to keep your blood glucose at a healthy level, especially if you are using insulin or taking certain medicines for diabetes mellitus. Alcohol Alcohol can cause sudden decreases in blood glucose (hypoglycemia), especially if you use insulin or take certain medicines for diabetes mellitus. Hypoglycemia can be a life-threatening condition. Symptoms of hypoglycemia (sleepiness, dizziness, and disorientation) are similar to symptoms of having too much alcohol. If your health care provider has given you approval to drink alcohol, do so in moderation and use  the following guidelines:  Women should not have more than one drink per day, and men should not have more than two drinks per day. One drink is equal to: ? 12 oz of beer. ? 5 oz of wine. ? 1 oz of hard liquor.  Do not drink on an empty stomach.  Keep yourself hydrated. Have water, diet soda, or unsweetened iced tea.  Regular soda, juice, and other mixers might contain a lot of carbohydrates and should be counted.  What foods are not recommended? As you make food choices, it is important to remember that all foods are not the same. Some foods have fewer nutrients per serving than other foods, even though they might have the same number of calories or carbohydrates. It is difficult to get your body what it needs when you eat foods with fewer nutrients. Examples of foods that you should avoid that are high in calories and carbohydrates but low in nutrients include:  Trans fats (most processed foods list trans fats on the Nutrition Facts label).  Regular soda.  Juice.  Candy.  Sweets, such as cake, pie, doughnuts, and cookies.  Fried foods.  What foods can I eat? Eat nutrient-rich foods, which will nourish your body and keep you healthy. The food you should eat also will depend on several factors, including:  The calories you need.  The medicines you take.  Your weight.  Your blood glucose level.  Your blood pressure level.  Your cholesterol level.  You should eat a variety of foods, including:  Protein. ? Lean cuts of meat. ? Proteins low in saturated fats, such as fish, egg whites, and beans. Avoid processed  meats.  Fruits and vegetables. ? Fruits and vegetables that may help control blood glucose levels, such as apples, mangoes, and yams.  Dairy products. ? Choose fat-free or low-fat dairy products, such as milk, yogurt, and cheese.  Grains, bread, pasta, and rice. ? Choose whole grain products, such as multigrain bread, whole oats, and brown rice. These foods  may help control blood pressure.  Fats. ? Foods containing healthful fats, such as nuts, avocado, olive oil, canola oil, and fish.  Does everyone with diabetes mellitus have the same meal plan? Because every person with diabetes mellitus is different, there is not one meal plan that works for everyone. It is very important that you meet with a dietitian who will help you create a meal plan that is just right for you. This information is not intended to replace advice given to you by your health care provider. Make sure you discuss any questions you have with your health care provider. Document Released: 12/20/2004 Document Revised: 08/31/2015 Document Reviewed: 02/19/2013 Elsevier Interactive Patient Education  2017 [employer].

## 2016-11-26 NOTE — Progress Notes (Signed)
[name], male    DOB: 1965/09/22, 51 y.o.   MRN: [id number]  PCP: Scot Jun, FNP   Subjective:  HPI Keith [name] is a 51 y.o. male with uncontrolled type 2 diabetes presents for evaluation of medication management. 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 Due to current insulin use he ineligible to pursue this career path. He does not have physical, and her present at today's visit however reports his blood sugars range anywhere from 140 to 150s when he checks it throughout the day. He does endorse some blood sugars being in the 200 range. At present his current regimen is Humalog 3 units 3 times daily with meals and 60 units of Lantus daily at bedtime. At present he is not on any oral antidiabetic medication and he is uncertain as to taking metformin in the past although willing to try now. He reports that he also trying to increase physical activity as tolerated as he now in pain management and able to receive his pain medications. Social History   Social History  . Marital status: Single    Spouse name: N/A  . Number of children: 42  . Years of education: 12   Occupational History  . Not on file.   Social History Main Topics  . Smoking status: Never Smoker  . Smokeless tobacco: Never Used  . Alcohol use No  . Drug use: No  . Sexual activity: Yes    Partners: Female     Comment: given condoms   Other Topics Concern  . Not on file   Social History Narrative   Worked for the city of [place] for 18 years.   Unemployed.    Applying for disability.   Medicaid patient.    Family History  Problem Relation Age of Onset  . Hypertension Mother   . Arthritis Father   . Hypertension Father   . Hypertension Brother   . Cancer Maternal Grandmother 70       unknown type of cancer  . Depression Paternal Grandmother     Review of Systems See HPI  Patient Active Problem List   Diagnosis Date Noted  . Dehydration 09/29/2016  . AKI (acute kidney injury) ([place]) 09/28/2016  . Diarrhea with dehydration 09/28/2016  . Nausea vomiting and diarrhea 09/28/2016  . Cellulitis and abscess of hand 06/04/2016  . Cellulitis of left hand 06/04/2016  . Onychomycosis of multiple toenails with type 2 diabetes mellitus ([place]) 08/29/2015  . MRSA carrier 04/20/2014  . Testosterone deficiency 04/20/2014  . Genital warts 02/22/2014  . HIV disease ([place])   . Insulin-requiring or dependent type II diabetes mellitus ([place]) 02/04/2014  . Dental anomaly 11/20/2012  . Arthritis of right knee 02/23/2012  . Hyperlipidemia 11/10/2011  . Chronic pain 08/07/2011  . Meralgia paraesthetica 04/23/2011  . ERECTILE DYSFUNCTION 08/22/2008  . Essential hypertension 05/19/2008    Allergies  Allergen Reactions  . Sulfa Antibiotics Itching    Prior to Admission medications   Medication Sig Start Date End Date Taking? Authorizing Provider  abacavir-dolutegravir-lamiVUDine (TRIUMEQ) 600-50-300 MG tablet TAKE 1 TABLET BY MOUTH DAILY, STOP GENVOYA AND PREZISTA 09/16/16  Yes [name], MD  amitriptyline (ELAVIL) 25 MG tablet Take 1 tablet at bedtime for 7 days.  Increase dose to 2 tablets at bedtime daily at bedtime. 10/31/16  Yes Scot Jun, FNP  aspirin EC 81 MG tablet Take 1 tablet (81 mg total) by mouth daily. 10/24/16  Yes Scot Jun, FNP  blood glucose meter kit and supplies KIT Dispense based on patient and insurance preference. Use up to four times daily as directed. (FOR ICD-9 250.00, 250.01). 10/24/16  Yes Scot Jun, FNP  calamine lotion Apply topically 2 (two) times daily. 09/29/16  Yes Nita Sells, MD  ciprofloxacin (CIPRO) 500 MG tablet Take 1 tablet (500 mg total) by mouth 2 (two) times daily. 11/18/16  Yes Deno Etienne, DO  cyclobenzaprine (FLEXERIL) 10 MG tablet Take 1 tablet (10 mg total) by  mouth 2 (two) times daily as needed for muscle spasms. 10/25/16  Yes Neese, [place], NP  darunavir-cobicistat (PREZCOBIX) 800-150 MG tablet Take 1 tablet by mouth daily. with food 09/16/16  Yes [name], MD  diclofenac sodium (VOLTAREN) 1 % GEL Apply 2 g topically 4 (four) times daily. 10/31/16  Yes Scot Jun, FNP  doxycycline (VIBRAMYCIN) 100 MG capsule Take 1 capsule (100 mg total) by mouth 2 (two) times daily. 10/25/16  Yes Neese, [place] M, NP  gabapentin (NEURONTIN) 300 MG capsule Take 1 capsule (300 mg total) by mouth 3 (three) times daily. 10/31/16  Yes Recardo Evangelist, PA-C  Insulin Glargine (LANTUS SOLOSTAR) 100 UNIT/ML Solostar Pen Inject 60 Units into the skin daily at 10 pm. Patient taking differently: Inject 70 Units into the skin daily at 10 pm.  06/01/14  Yes Darrick Meigs, Marge Dunc[place], MD  insulin lispro (HUMALOG KWIKPEN) 100 UNIT/ML KiwkPen Inject 0.03 mLs (3 Units total) into the skin 3 (three) times daily. 10/30/16  Yes Scot Jun, FNP  Insulin Syringes, Disposable, U-100 0.3 ML MISC For administration of insulin TID. ICD E-11.9 10/29/16  Yes Scot Jun, FNP  lidocaine (LIDODERM) 5 % Place 1 patch onto the skin daily. Remove & Discard patch within 12 hours. 10/28/16  Yes Scot Jun, FNP  lisinopril (PRINIVIL,ZESTRIL) 5 MG tablet Take 1 tablet (5 mg total) by mouth daily. 10/24/16  Yes Scot Jun, FNP  LORazepam (ATIVAN) 1 MG tablet Take 1 tablet (1 mg total) by mouth every 6 (six) hours as needed for anxiety. 01/30/15  Yes [name], MD  Naftifine HCl 2 % CREA Apply 1 application topically daily. 10/25/16  Yes Neese, [place] M, NP  Oxycodone HCl 10 MG TABS Take 1 tablet (10 mg total) by mouth every 8 (eight) hours as needed (moderate or severe pain). 09/29/16  Yes Nita Sells, MD  pravastatin (PRAVACHOL) 40 MG tablet Take 1 tablet (40 mg total) by mouth daily. 10/28/16  Yes Scot Jun, FNP  traMADol (ULTRAM) 50 MG tablet Take 1 tablet  (50 mg total) by mouth every 6 (six) hours as needed. 10/31/16  Yes Recardo Evangelist, PA-C   Past Medical, Surgical Family and Social History reviewed and updated.   Objective:   Today's Vitals   11/26/16 0918  BP: (!) 142/90  Pulse: 79  Resp: 16  Temp: 98.6 F (37 C)  TempSrc: Oral  SpO2: 100%  Weight: 273 lb 6.4 oz (124 kg)  Height: _0  (1.778 m)    Wt Readings from Last 3 Encounters:  11/26/16 273 lb 6.4 oz (124 kg)  11/18/16 260 lb (117.9 kg)  10/28/16 266 lb (120.7 kg)   Physical Exam  Constitutional: He is oriented to person, place, and time. He appears well-developed and well-nourished.  HENT:  Head: Normocephalic and atraumatic.  Eyes: Pupils  are equal, round, and reactive to light. Conjunctivae are normal.  Neck: Normal range of motion. Neck supple.  Cardiovascular: Normal rate, regular rhythm, normal heart sounds and intact distal pulses.   Pulmonary/Chest: Effort normal and breath sounds normal.  Abdominal: Soft. Bowel sounds are normal.  Musculoskeletal: Normal range of motion.  Neurological: He is alert and oriented to person, place, and time.  Skin: Skin is warm and dry.  Psychiatric: He has a normal mood and affect. His behavior is normal. Judgment and thought content normal.   Assessment & Plan:  1. Type 2 diabetes mellitus with complication, with long-term current use of insulin (HCC) -Continue Humalog and Lantus for now. I am adding metformin 1000 mg twice daily to evaluate how you tolerate medication. Return in one month with you meter to evaluate blood sugar readings. Depending on readings, we will consider discontinuing your short acting insulin first.   -Given an education handout with recommendations for diabetic 1800 calorie/ day diet -Increase physical activity as tolerated  RTC: 1 month   Carroll Sage. Kenton Kingfisher, MSN, FNP-C [facility]  [address] [place] [place], [place] [address] [phone]

## 2016-12-03 DIAGNOSIS — G600 Hereditary motor and sensory neuropathy: Secondary | ICD-10-CM | POA: Diagnosis not present

## 2016-12-12 DIAGNOSIS — M25569 Pain in unspecified knee: Secondary | ICD-10-CM | POA: Diagnosis not present

## 2016-12-12 DIAGNOSIS — G629 Polyneuropathy, unspecified: Secondary | ICD-10-CM | POA: Diagnosis not present

## 2016-12-12 DIAGNOSIS — M545 Low back pain: Secondary | ICD-10-CM | POA: Diagnosis not present

## 2016-12-12 DIAGNOSIS — G894 Chronic pain syndrome: Secondary | ICD-10-CM | POA: Diagnosis not present

## 2016-12-12 DIAGNOSIS — Z79891 Long term (current) use of opiate analgesic: Secondary | ICD-10-CM | POA: Diagnosis not present

## 2016-12-12 DIAGNOSIS — Z79899 Other long term (current) drug therapy: Secondary | ICD-10-CM | POA: Diagnosis not present

## 2016-12-22 IMAGING — DX DG CHEST 2V
2 series · 2 of 2 positions shown · non-contrast
Comparison: Chest radiograph dated 01/23/2014

CLINICAL DATA: 50-year-old male with congestion cough and chest
pain.

EXAM:
CHEST  2 VIEW

[chest pa]
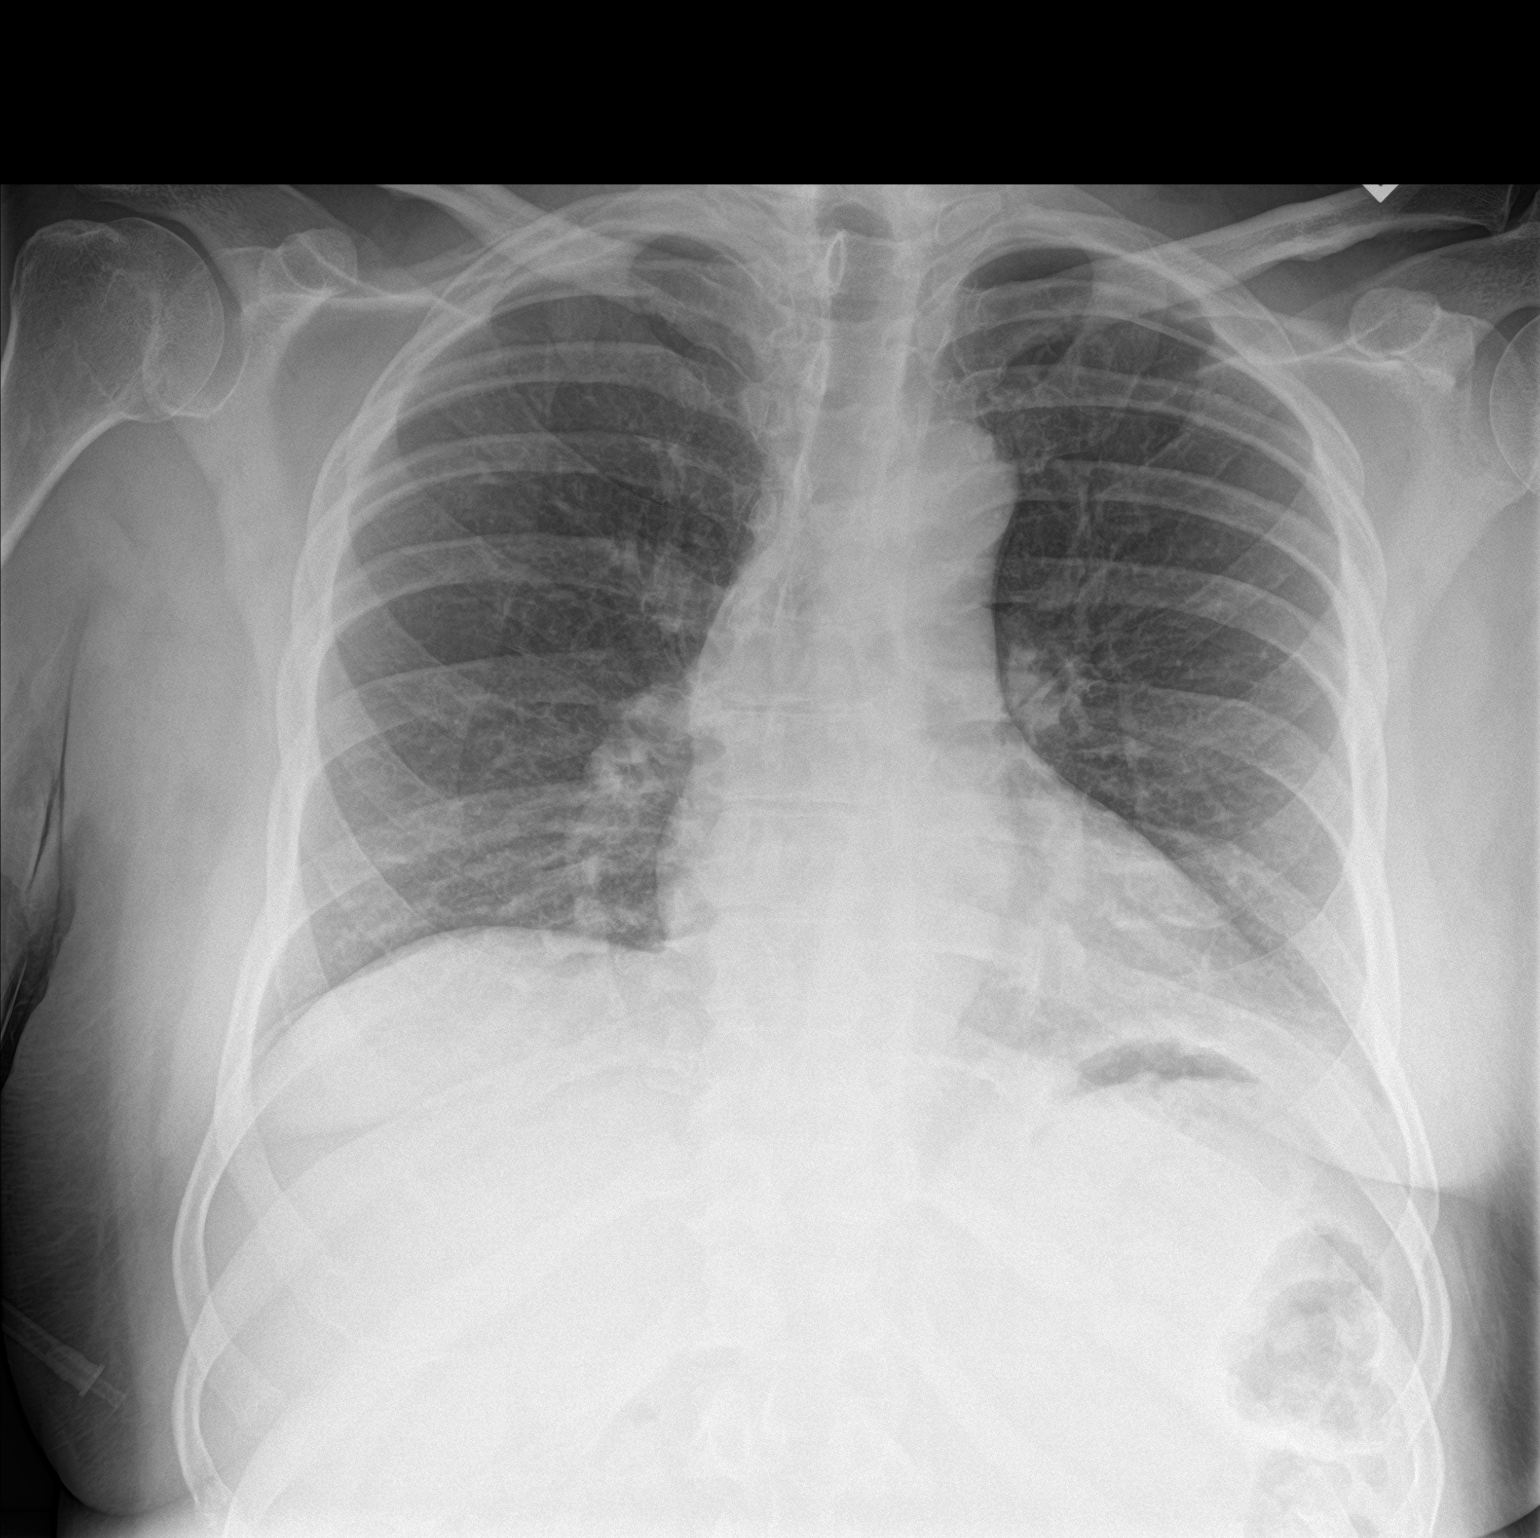

[chest lat]
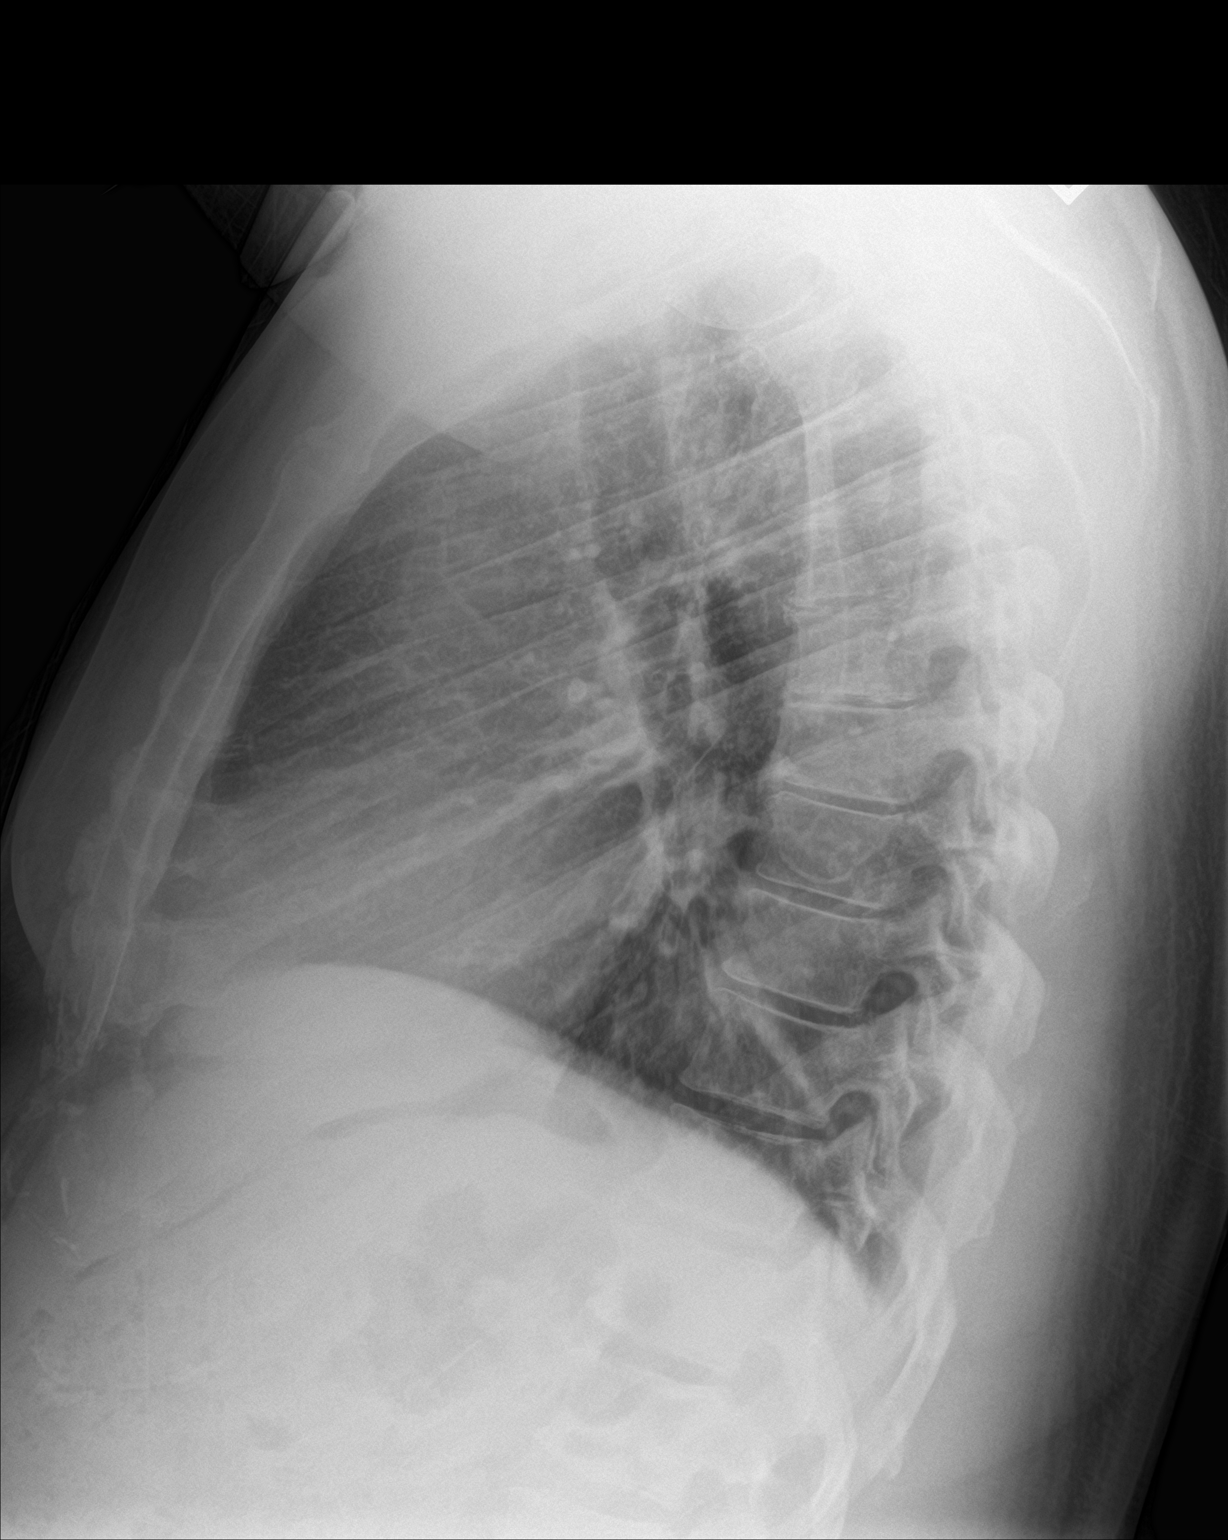

[2 of 2 positions shown; findings below may reference images not displayed]

FINDINGS: The heart size and mediastinal contours are within normal limits.
Both lungs are clear. The visualized skeletal structures are
unremarkable.
IMPRESSION: No active cardiopulmonary disease.

## 2016-12-30 ENCOUNTER — Ambulatory Visit: Payer: Medicare Other | Admitting: Family Medicine

## 2016-12-31 ENCOUNTER — Telehealth: Payer: Self-pay | Admitting: *Deleted

## 2016-12-31 ENCOUNTER — Encounter (HOSPITAL_COMMUNITY): Payer: Self-pay | Admitting: Emergency Medicine

## 2016-12-31 ENCOUNTER — Emergency Department (HOSPITAL_COMMUNITY)
Admission: EM | Admit: 2016-12-31 | Discharge: 2016-12-31 | Disposition: A | Discharge status: Home / Self Care | Payer: Medicare Other | Attending: Emergency Medicine | Admitting: Emergency Medicine

## 2016-12-31 ENCOUNTER — Emergency Department (HOSPITAL_COMMUNITY): Payer: Medicare Other

## 2016-12-31 DIAGNOSIS — Z794 Long term (current) use of insulin: Secondary | ICD-10-CM | POA: Insufficient documentation

## 2016-12-31 DIAGNOSIS — L97511 Non-pressure chronic ulcer of other part of right foot limited to breakdown of skin: Secondary | ICD-10-CM | POA: Diagnosis not present

## 2016-12-31 DIAGNOSIS — E11621 Type 2 diabetes mellitus with foot ulcer: Secondary | ICD-10-CM | POA: Insufficient documentation

## 2016-12-31 DIAGNOSIS — Z7982 Long term (current) use of aspirin: Secondary | ICD-10-CM | POA: Insufficient documentation

## 2016-12-31 DIAGNOSIS — B20 Human immunodeficiency virus [HIV] disease: Secondary | ICD-10-CM | POA: Diagnosis not present

## 2016-12-31 DIAGNOSIS — I10 Essential (primary) hypertension: Secondary | ICD-10-CM | POA: Insufficient documentation

## 2016-12-31 DIAGNOSIS — Z79899 Other long term (current) drug therapy: Secondary | ICD-10-CM | POA: Insufficient documentation

## 2016-12-31 DIAGNOSIS — E08621 Diabetes mellitus due to underlying condition with foot ulcer: Secondary | ICD-10-CM

## 2016-12-31 DIAGNOSIS — L97411 Non-pressure chronic ulcer of right heel and midfoot limited to breakdown of skin: Secondary | ICD-10-CM | POA: Diagnosis not present

## 2016-12-31 LAB — COMPREHENSIVE METABOLIC PANEL
ALT: 12 U/L — ABNORMAL LOW (ref 17–63)
AST: 19 U/L (ref 15–41)
Albumin: 3.7 g/dL (ref 3.5–5.0)
Alkaline Phosphatase: 62 U/L (ref 38–126)
Anion gap: 7 (ref 5–15)
BUN: 19 mg/dL (ref 6–20)
CO2: 26 mmol/L (ref 22–32)
Calcium: 9.4 mg/dL (ref 8.9–10.3)
Chloride: 100 mmol/L — ABNORMAL LOW (ref 101–111)
Creatinine, Ser: 1.63 mg/dL — ABNORMAL HIGH (ref 0.61–1.24)
GFR calc Af Amer: 54 mL/min — ABNORMAL LOW (ref >60)
GFR calc non Af Amer: 47 mL/min — ABNORMAL LOW (ref >60)
Glucose, Bld: 340 mg/dL — ABNORMAL HIGH (ref 65–99)
Potassium: 3.8 mmol/L (ref 3.5–5.1)
Sodium: 133 mmol/L — ABNORMAL LOW (ref 135–145)
Total Bilirubin: 0.7 mg/dL (ref 0.3–1.2)
Total Protein: 7.6 g/dL (ref 6.5–8.1)

## 2016-12-31 LAB — CBC WITH DIFFERENTIAL/PLATELET
Basophils Absolute: 0 10*3/uL (ref 0.0–0.1)
Basophils Relative: 0 %
Eosinophils Absolute: 0 10*3/uL (ref 0.0–0.7)
Eosinophils Relative: 1 %
HCT: 32.3 % — ABNORMAL LOW (ref 39.0–52.0)
Hemoglobin: 10.8 g/dL — ABNORMAL LOW (ref 13.0–17.0)
Lymphocytes Relative: 26 %
Lymphs Abs: 1.8 10*3/uL (ref 0.7–4.0)
MCH: 28.3 pg (ref 26.0–34.0)
MCHC: 33.4 g/dL (ref 30.0–36.0)
MCV: 84.6 fL (ref 78.0–100.0)
Monocytes Absolute: 0.3 10*3/uL (ref 0.1–1.0)
Monocytes Relative: 5 %
Neutro Abs: 4.6 10*3/uL (ref 1.7–7.7)
Neutrophils Relative %: 68 %
Platelets: 276 10*3/uL (ref 150–400)
RBC: 3.82 MIL/uL — ABNORMAL LOW (ref 4.22–5.81)
RDW: 13.9 % (ref 11.5–15.5)
WBC: 6.8 10*3/uL (ref 4.0–10.5)

## 2016-12-31 LAB — I-STAT CG4 LACTIC ACID, ED
Lactic Acid, Venous: 2.14 mmol/L (ref 0.5–1.9)
Lactic Acid, Venous: 1.26 mmol/L (ref 0.5–1.9)

## 2016-12-31 MED ORDER — DOXYCYCLINE HYCLATE 100 MG PO CAPS: 100.0000 mg | ORAL_CAPSULE | Freq: Two times a day (BID) | ORAL | 0 refills | Status: DC

## 2016-12-31 MED ORDER — CEPHALEXIN 500 MG PO CAPS: 500.0000 mg | ORAL_CAPSULE | Freq: Four times a day (QID) | ORAL | 0 refills | Status: DC

## 2016-12-31 NOTE — ED Triage Notes (Signed)
Pt c/o wound to the bottom of his right foot. States the wound has an odor, went to his PCP and was sent here. Pt has hx of diabetes, states his blood sugars have been normal.

## 2016-12-31 NOTE — Telephone Encounter (Signed)
Patient walked into clinic c/o foul smelling right foot wound with drainage. Looked at his foot and it did have quite a bit of drainage. Advised patient to go to the ED for evaluation. Also made him an appt for tomorrow at his PCP for 9:30 AM. Myrtis Hopping  CMA

## 2016-12-31 NOTE — Discharge Instructions (Signed)
Please read instructions below. Attend your appointment in the morning. Schedule an appointment with the Foot specialist, Dr. Amalia Hailey for a follow up in 2-3days.  Begin taking your antibiotics as prescribed until they are gone. Wear skin protection, as Doxycycline can make your skin sensitive to the sun. Monitor your sugars as it is important to control your sugar for good wound healing. Return to the ER for fever, pus draining for wound or new or concerning symptoms.

## 2016-12-31 NOTE — ED Notes (Signed)
Pt on phone while PA attempting assessment, this RN will return to eval pt once pt is off phone and PA has spoke with the pt

## 2016-12-31 NOTE — ED Provider Notes (Signed)
[facility]-EMERGENCY DEPT Provider Note   CSN: [id number] Arrival date & time: 12/31/16  1507     History   Chief Complaint Chief Complaint  Patient presents with  . Wound Check    HPI Keith [name] is a 51 y.o. male w PMHx HIV, T2DM, HLD, osteomyelitis, presenting to ED w wound to right foot that he states his ID provider noticed today during his visit. He states he was unaware of this wound until it was brought to his attention. He reports assoc odor and pain in his leg. He denies F/C, N/V, or any other complaints today. Pt reports going to an infectious disease appointment today and states he was told his viral load is undetectable. Per chart review, last viral load done in February 2018 was detectable with CD4 count of 210.  The history is provided by the patient.    Past Medical History:  Diagnosis Date  . AIDS (HCC)   . Chronic knee pain    right  . Chronic pain   . Diabetes mellitus   . Diabetes type 2, uncontrolled (HCC)    HgA1c 17.6 (04/27/2010)  . Erectile dysfunction   . Genital warts   . HIV (human immunodeficiency virus infection) (HCC) 2009   CD4 count 100, VL 13800 (05/01/2010)  . Hypertension   . Osteomyelitis [facility])    h/o hand    Patient Active Problem List   Diagnosis Date Noted  . Dehydration 09/29/2016  . AKI (acute kidney injury) (HCC) 09/28/2016  . Diarrhea with dehydration 09/28/2016  . Nausea vomiting and diarrhea 09/28/2016  . Cellulitis and abscess of hand 06/04/2016  . Cellulitis of left hand 06/04/2016  . Onychomycosis of multiple toenails with type 2 diabetes mellitus (HCC) 08/29/2015  . MRSA carrier 04/20/2014  . Testosterone deficiency 04/20/2014  . Genital warts 02/22/2014  . HIV disease (HCC)   . Insulin-requiring or dependent type II diabetes mellitus (HCC) 02/04/2014  . Dental anomaly 11/20/2012  . Arthritis of right knee 02/23/2012  . Hyperlipidemia 11/10/2011  . Chronic pain 08/07/2011  . Meralgia paraesthetica 04/23/2011  .  ERECTILE DYSFUNCTION 08/22/2008  . Essential hypertension 05/19/2008    Past Surgical History:  Procedure Laterality Date  . HERNIA REPAIR    . I&D EXTREMITY Left 08/21/2014   Procedure: INCISION AND DRAINAGE LEFT SMALL FINGER;  Surgeon: Betha Loa, MD;  Location: [facility] OR;  Service: Orthopedics;  Laterality: Left;  . MINOR IRRIGATION AND DEBRIDEMENT OF WOUND Right 04/22/2014   Procedure: IRRIGATION AND DEBRIDEMENT OF RIGHT NECK ABCESS;  Surgeon: Osborn Coho, MD;  Location: [facility] OR;  Service: ENT;  Laterality: Right;  . MULTIPLE EXTRACTIONS WITH ALVEOLOPLASTY N/A 01/18/2013   Procedure: MULTIPLE EXTRACION 3, 6, 7, 10, 11, 13, 21, 22, 27, 28, 29, 30 WITH ALVEOLOPLASTY;  Surgeon: Georgia Lopes, DDS;  Location: [facility] OR;  Service: Oral Surgery;  Laterality: N/A;       Home Medications    Prior to Admission medications   Medication Sig Start Date End Date Taking? Authorizing Provider  abacavir-dolutegravir-lamiVUDine (TRIUMEQ) 600-50-300 MG tablet TAKE 1 TABLET BY MOUTH DAILY, STOP GENVOYA AND PREZISTA 09/16/16   Ginnie Smart, MD  amitriptyline (ELAVIL) 25 MG tablet Take 1 tablet at bedtime for 7 days.  Increase dose to 2 tablets at bedtime daily at bedtime. 10/31/16   Bing Neighbors, FNP  aspirin EC 81 MG tablet Take 1 tablet (81 mg total) by mouth daily. 10/24/16   Bing Neighbors, FNP  blood glucose meter  kit and supplies KIT Dispense based on patient and insurance preference. Use up to four times daily as directed. (FOR ICD-9 250.00, 250.01). 10/24/16   Scot Jun, FNP  calamine lotion Apply topically 2 (two) times daily. 09/29/16   Nita Sells, MD  cephALEXin (KEFLEX) 500 MG capsule Take 1 capsule (500 mg total) by mouth 4 (four) times daily. 12/31/16 01/14/17  Russo, [place] N, PA-C  ciprofloxacin (CIPRO) 500 MG tablet Take 1 tablet (500 mg total) by mouth 2 (two) times daily. 11/18/16   Deno Etienne, DO  cyclobenzaprine (FLEXERIL) 10 MG tablet Take 1 tablet (10 mg  total) by mouth 2 (two) times daily as needed for muscle spasms. 10/25/16   Ashley Murrain, NP  darunavir-cobicistat (PREZCOBIX) 800-150 MG tablet Take 1 tablet by mouth daily. with food 09/16/16   [name], MD  diclofenac sodium (VOLTAREN) 1 % GEL Apply 2 g topically 4 (four) times daily. 10/31/16   Scot Jun, FNP  doxycycline (VIBRAMYCIN) 100 MG capsule Take 1 capsule (100 mg total) by mouth 2 (two) times daily. 12/31/16 01/14/17  Russo, [place] N, PA-C  gabapentin (NEURONTIN) 300 MG capsule Take 1 capsule (300 mg total) by mouth 3 (three) times daily. 10/31/16   Recardo Evangelist, PA-C  Insulin Glargine (LANTUS SOLOSTAR) 100 UNIT/ML Solostar Pen Inject 60 Units into the skin daily at 10 pm. Patient taking differently: Inject 70 Units into the skin daily at 10 pm.  06/01/14   [name], MD  insulin lispro (HUMALOG KWIKPEN) 100 UNIT/ML KiwkPen Inject 0.03 mLs (3 Units total) into the skin 3 (three) times daily. 10/30/16   Scot Jun, FNP  Insulin Syringes, Disposable, U-100 0.3 ML MISC For administration of insulin TID. ICD E-11.9 10/29/16   Scot Jun, FNP  lidocaine (LIDODERM) 5 % Place 1 patch onto the skin daily. Remove & Discard patch within 12 hours. 10/28/16   Scot Jun, FNP  lisinopril (PRINIVIL,ZESTRIL) 5 MG tablet Take 1 tablet (5 mg total) by mouth daily. 10/24/16   Scot Jun, FNP  LORazepam (ATIVAN) 1 MG tablet Take 1 tablet (1 mg total) by mouth every 6 (six) hours as needed for anxiety. 01/30/15   [name], MD  metFORMIN (GLUCOPHAGE) 500 MG tablet Take 2 tablets (1,000 mg total) by mouth 2 (two) times daily with a meal. 11/26/16   Scot Jun, FNP  Naftifine HCl 2 % CREA Apply 1 application topically daily. 10/25/16   Ashley Murrain, NP  Oxycodone HCl 10 MG TABS Take 1 tablet (10 mg total) by mouth every 8 (eight) hours as needed (moderate or severe pain). 09/29/16   Nita Sells, MD  pravastatin (PRAVACHOL) 40 MG tablet  Take 1 tablet (40 mg total) by mouth daily. 10/28/16   Scot Jun, FNP  traMADol (ULTRAM) 50 MG tablet Take 1 tablet (50 mg total) by mouth every 6 (six) hours as needed. 10/31/16   Recardo Evangelist, PA-C    Family History Family History  Problem Relation Age of Onset  . Hypertension Mother   . Arthritis Father   . Hypertension Father   . Hypertension Brother   . Cancer Maternal Grandmother 47       unknown type of cancer  . Depression Paternal Grandmother     Social History Social History  Substance Use Topics  . Smoking status: Never Smoker  . Smokeless tobacco: Never Used  . Alcohol use No     Allergies  Sulfa antibiotics   Review of Systems Review of Systems  Constitutional: Negative for chills and fever.  Gastrointestinal: Negative for nausea and vomiting.  Skin: Positive for wound.  Allergic/Immunologic: Positive for immunocompromised state (HIV, DM).  All other systems reviewed and are negative.    Physical Exam Updated Vital Signs BP 128/80   Pulse 80   Temp 98.3 F (36.8 C) (Oral)   Resp 18   Ht _0  (1.753 m)   Wt 117.9 kg (260 lb)   SpO2 97%   BMI 38.40 kg/m   Physical Exam  Constitutional: He appears well-developed and well-nourished. No distress.  HENT:  Head: Normocephalic and atraumatic.  Eyes: Conjunctivae are normal.  Cardiovascular: Normal rate, regular rhythm, normal heart sounds and intact distal pulses.   Pulmonary/Chest: Effort normal and breath sounds normal.  Abdominal: Soft. Bowel sounds are normal.  Musculoskeletal:  Nl ROM of right foot and ankle, no edema, ecchymosis or deformities.  Neurological: He is alert.  Skin: Skin is warm.  Feet are dry. Right plantar aspect with malodorous ulcer, no purulent drainage. Tenderness surrounding wound with mild erythema.   Psychiatric: He has a normal mood and affect. His behavior is normal.  Nursing note and vitals reviewed.         ED Treatments / Results   Labs (all labs ordered are listed, but only abnormal results are displayed) Labs Reviewed  COMPREHENSIVE METABOLIC PANEL - Abnormal; Notable for the following:       Result Value   Sodium 133 (*)    Chloride 100 (*)    Glucose, Bld 340 (*)    Creatinine, Ser 1.63 (*)    ALT 12 (*)    GFR calc non Af Amer 47 (*)    GFR calc Af Amer 54 (*)    All other components within normal limits  CBC WITH DIFFERENTIAL/PLATELET - Abnormal; Notable for the following:    RBC 3.82 (*)    Hemoglobin 10.8 (*)    HCT 32.3 (*)    All other components within normal limits  I-STAT CG4 LACTIC ACID, ED - Abnormal; Notable for the following:    Lactic Acid, Venous 2.14 (*)    All other components within normal limits  I-STAT CG4 LACTIC ACID, ED    EKG  EKG Interpretation None       Radiology Dg Foot Complete Right  Result Date: 12/31/2016 CLINICAL DATA:  Initial evaluation for soft tissue ulcer, evaluate for osteomyelitis. EXAM: RIGHT FOOT COMPLETE - 3+ VIEW COMPARISON:  None. FINDINGS: Soft tissue irregularity consistent with ulceration present at the plantar aspect of the forefoot. No radiopaque foreign body or dissecting soft tissue emphysema. No radiographic findings to suggest active osteomyelitis. No acute fracture or dislocation. IMPRESSION: 1. Soft tissue ulceration at the plantar and medial aspect of the right forefoot. No radiopaque foreign body or dissecting soft tissue emphysema. No evidence for osteomyelitis. 2. No other acute osseous abnormality about the foot. Electronically Signed   By: Jeannine Boga M.D.   On: 12/31/2016 19:18    Procedures Procedures (including critical care time)  Medications Ordered in ED Medications - No data to display   Initial Impression / Assessment and Plan / ED Course  I have reviewed the triage vital signs and the nursing notes.  Pertinent labs & imaging results that were available during my care of the patient were reviewed by me and  considered in my medical decision making (see chart for details).     Pt  presenting with wound to plantar aspect of right foot, likely diabetic ulcer. Pt afebrile, nontoxic, not in distress. Glucose 340. WBC wnl. Remainder of labs at baseline. Xray neg for osteomyelitis. Pt with close follow up, PCP appointment in the morning at 0930, and therefore feel comfortable for discharge with outpt management. Will discharge w Keflex and Doxycycline. Will provide podiatry referral as well. Pt given strict return precautions. Pt is safe for discharge.  Patient discussed with and seen by Dr. Venora Maples.  Discussed results, findings, treatment and follow up. Patient advised of return precautions. Patient verbalized understanding and agreed with plan.  Final Clinical Impressions(s) / ED Diagnoses   Final diagnoses:  Diabetic ulcer of right midfoot associated with diabetes mellitus due to underlying condition, limited to breakdown of skin (HCC)    New Prescriptions New Prescriptions   CEPHALEXIN (KEFLEX) 500 MG CAPSULE    Take 1 capsule (500 mg total) by mouth 4 (four) times daily.   DOXYCYCLINE (VIBRAMYCIN) 100 MG CAPSULE    Take 1 capsule (100 mg total) by mouth 2 (two) times daily.     Russo, [place] N, PA-C 12/31/16 2016    Jola Schmidt, MD 01/01/17 [phone]

## 2016-12-31 NOTE — ED Notes (Signed)
PT states understanding of care given, follow up care, and medication prescribed. PT ambulated from ED to car with a steady gait. 

## 2017-01-01 ENCOUNTER — Ambulatory Visit (INDEPENDENT_AMBULATORY_CARE_PROVIDER_SITE_OTHER): Payer: Medicare Other | Admitting: Family Medicine

## 2017-01-01 ENCOUNTER — Encounter: Payer: Self-pay | Admitting: Family Medicine

## 2017-01-01 VITALS — BP 130/87 | HR 80 | Temp 98.1°F | Resp 16 | Ht 69.0 in | Wt 266.0 lb

## 2017-01-01 DIAGNOSIS — L97509 Non-pressure chronic ulcer of other part of unspecified foot with unspecified severity: Secondary | ICD-10-CM

## 2017-01-01 DIAGNOSIS — Z794 Long term (current) use of insulin: Secondary | ICD-10-CM | POA: Diagnosis not present

## 2017-01-01 DIAGNOSIS — E118 Type 2 diabetes mellitus with unspecified complications: Secondary | ICD-10-CM

## 2017-01-01 LAB — POCT URINALYSIS DIP (DEVICE)
Bilirubin Urine: NEGATIVE
Glucose, UA: 250 mg/dL — AB
Ketones, ur: NEGATIVE mg/dL
Leukocytes, UA: NEGATIVE
Nitrite: NEGATIVE
Protein, ur: 100 mg/dL — AB
Specific Gravity, Urine: >=1.03 (ref 1.005–1.030)
Urobilinogen, UA: 1 mg/dL (ref 0.0–1.0)
pH: 5.5 (ref 5.0–8.0)

## 2017-01-01 LAB — POCT GLYCOSYLATED HEMOGLOBIN (HGB A1C): Hemoglobin A1C: 8.2

## 2017-01-01 NOTE — Patient Instructions (Addendum)
Call [place], [place], [place] [phone]  Diabetes Continue Lantus and Metformin as prescribed. You only administers Humalog (shortacting insulin) at meals if blood sugar is greater than 200 before meals.    Diabetes and Foot Care Diabetes may cause you to have problems because of poor blood supply (circulation) to your feet and legs. This may cause the skin on your feet to become thinner, break easier, and heal more slowly. Your skin may become dry, and the skin may peel and crack. You may also have nerve damage in your legs and feet causing decreased feeling in them. You may not notice minor injuries to your feet that could lead to infections or more serious problems. Taking care of your feet is one of the most important things you can do for yourself. Follow these instructions at home:  Wear shoes at all times, even in the house. Do not go barefoot. Bare feet are easily injured.  Check your feet daily for blisters, cuts, and redness. If you cannot see the bottom of your feet, use a mirror or ask someone for help.  Wash your feet with warm water (do not use hot water) and mild soap. Then pat your feet and the areas between your toes until they are completely dry. Do not soak your feet as this can dry your skin.  Apply a moisturizing lotion or petroleum jelly (that does not contain alcohol and is unscented) to the skin on your feet and to dry, brittle toenails. Do not apply lotion between your toes.  Trim your toenails straight across. Do not dig under them or around the cuticle. File the edges of your nails with an emery board or nail file.  Do not cut corns or calluses or try to remove them with medicine.  Wear clean socks or stockings every day. Make sure they are not too tight. Do not wear knee-high stockings since they may decrease blood flow to your legs.  Wear shoes that fit properly and have enough cushioning. To break in new shoes, wear  them for just a few hours a day. This prevents you from injuring your feet. Always look in your shoes before you put them on to be sure there are no objects inside.  Do not cross your legs. This may decrease the blood flow to your feet.  If you find a minor scrape, cut, or break in the skin on your feet, keep it and the skin around it clean and dry. These areas may be cleansed with mild soap and water. Do not cleanse the area with peroxide, alcohol, or iodine.  When you remove an adhesive bandage, be sure not to damage the skin around it.  If you have a wound, look at it several times a day to make sure it is healing.  Do not use heating pads or hot water bottles. They may burn your skin. If you have lost feeling in your feet or legs, you may not know it is happening until it is too late.  Make sure your health care provider performs a complete foot exam at least annually or more often if you have foot problems. Report any cuts, sores, or bruises to your health care provider immediately. Contact a health care provider if:  You have an injury that is not healing.  You have cuts or breaks in the skin.  You have an ingrown nail.  You notice redness on your legs or feet.  You feel burning or tingling in your legs or feet.  You have pain or cramps in your legs and feet.  Your legs or feet are numb.  Your feet always feel cold. Get help right away if:  There is increasing redness, swelling, or pain in or around a wound.  There is a red line that goes up your leg.  Pus is coming from a wound.  You develop a fever or as directed by your health care provider.  You notice a bad smell coming from an ulcer or wound. This information is not intended to replace advice given to you by your health care provider. Make sure you discuss any questions you have with your health care provider. Document Released: 03/22/2000 Document Revised: 08/31/2015 Document Reviewed: 09/01/2012 Elsevier  Interactive Patient Education  2017 [employer].

## 2017-01-01 NOTE — Progress Notes (Signed)
[name], male    DOB: 21-Apr-1965, 51 y.o.   MRN: [id number]  PCP: Scot Jun, FNP  Chief Complaint  Patient presents with  . Follow-up    1 month    Subjective:  HPI Keith [name] is a 51 y.o. male presents for 1 month diabetes management follow-up. Keith [name] was seen last month and wanted to trial starting oral diabetes medication and making lifestyle modification in efforts to eliminate a need to for insulin. 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 He reports no major lifestyle changes or adding physical activity since his last office visit. Keith [name] suffers from chronic pain and was recently diagnosed with a diabetic foot ulcer that he was unaware of. He denies that ulcer is painful or draining of exudate. He was treated one day prior in the ED and placed on an antibiotic, Cephalexin times one day prior. Last A1C 8.5. Current Body mass index is 39.28 kg/m. Social History   Social History  . Marital status: Single    Spouse name: N/A  . Number of children: 68  . Years of education: 12   Occupational History  . Not on file.   Social History Main Topics  . Smoking status: Never Smoker  . Smokeless tobacco: Never Used  . Alcohol use No  . Drug use: No  . Sexual activity: Yes    Partners: Female     Comment: given condoms   Other Topics Concern  . Not on file   Social History Narrative   Worked for the city of [place] for 18 years.   Unemployed.    Applying for disability.   Medicaid patient.    Family History  Problem Relation Age of Onset  . Hypertension Mother   . Arthritis Father   . Hypertension Father   . Hypertension Brother   . Cancer Maternal Grandmother 26       unknown type of cancer  . Depression Paternal Grandmother    Review of Systems  See HPI   Patient Active Problem List   Diagnosis Date Noted  .  Dehydration 09/29/2016  . AKI (acute kidney injury) ([place]) 09/28/2016  . Diarrhea with dehydration 09/28/2016  . Nausea vomiting and diarrhea 09/28/2016  . Cellulitis and abscess of hand 06/04/2016  . Cellulitis of left hand 06/04/2016  . Onychomycosis of multiple toenails with type 2 diabetes mellitus ([place]) 08/29/2015  . MRSA carrier 04/20/2014  . Testosterone deficiency 04/20/2014  . Genital warts 02/22/2014  . HIV disease ([place])   . Insulin-requiring or dependent type II diabetes mellitus ([place]) 02/04/2014  . Dental anomaly 11/20/2012  . Arthritis of right knee 02/23/2012  . Hyperlipidemia 11/10/2011  . Chronic pain 08/07/2011  . Meralgia paraesthetica 04/23/2011  . ERECTILE DYSFUNCTION 08/22/2008  . Essential hypertension 05/19/2008    Allergies  Allergen Reactions  . Sulfa Antibiotics Itching    Prior to Admission medications   Medication Sig Start Date End Date Taking? Authorizing Provider  abacavir-dolutegravir-lamiVUDine (TRIUMEQ) 600-50-300 MG tablet TAKE 1 TABLET BY MOUTH DAILY, STOP GENVOYA AND PREZISTA 09/16/16  Yes [name], MD  amitriptyline (ELAVIL) 25 MG tablet Take 1 tablet at bedtime for 7 days.  Increase dose to 2 tablets at bedtime daily at bedtime. 10/31/16  Yes Scot Jun, FNP  aspirin EC 81 MG tablet Take 1 tablet (81 mg total) by mouth daily.  10/24/16  Yes Scot Jun, FNP  blood glucose meter kit and supplies KIT Dispense based on patient and insurance preference. Use up to four times daily as directed. (FOR ICD-9 250.00, 250.01). 10/24/16  Yes Scot Jun, FNP  calamine lotion Apply topically 2 (two) times daily. 09/29/16  Yes Nita Sells, MD  cephALEXin (KEFLEX) 500 MG capsule Take 1 capsule (500 mg total) by mouth 4 (four) times daily. 12/31/16 01/14/17 Yes Russo, [place] N, PA-C  ciprofloxacin (CIPRO) 500 MG tablet Take 1 tablet (500 mg total) by mouth 2 (two) times daily. 11/18/16  Yes Deno Etienne, DO  cyclobenzaprine  (FLEXERIL) 10 MG tablet Take 1 tablet (10 mg total) by mouth 2 (two) times daily as needed for muscle spasms. 10/25/16  Yes Neese, [place], NP  darunavir-cobicistat (PREZCOBIX) 800-150 MG tablet Take 1 tablet by mouth daily. with food 09/16/16  Yes [name], MD  diclofenac sodium (VOLTAREN) 1 % GEL Apply 2 g topically 4 (four) times daily. 10/31/16  Yes Scot Jun, FNP  doxycycline (VIBRAMYCIN) 100 MG capsule Take 1 capsule (100 mg total) by mouth 2 (two) times daily. 12/31/16 01/14/17 Yes Virgina Jock, [place] N, PA-C  gabapentin (NEURONTIN) 300 MG capsule Take 1 capsule (300 mg total) by mouth 3 (three) times daily. 10/31/16  Yes Recardo Evangelist, PA-C  Insulin Glargine (LANTUS SOLOSTAR) 100 UNIT/ML Solostar Pen Inject 60 Units into the skin daily at 10 pm. Patient taking differently: Inject 70 Units into the skin daily at 10 pm.  06/01/14  Yes Darrick Meigs, Marge Duncans, MD  insulin lispro (HUMALOG KWIKPEN) 100 UNIT/ML KiwkPen Inject 0.03 mLs (3 Units total) into the skin 3 (three) times daily. 10/30/16  Yes Scot Jun, FNP  Insulin Syringes, Disposable, U-100 0.3 ML MISC For administration of insulin TID. ICD E-11.9 10/29/16  Yes Scot Jun, FNP  lidocaine (LIDODERM) 5 % Place 1 patch onto the skin daily. Remove & Discard patch within 12 hours. 10/28/16  Yes Scot Jun, FNP  lisinopril (PRINIVIL,ZESTRIL) 5 MG tablet Take 1 tablet (5 mg total) by mouth daily. 10/24/16  Yes Scot Jun, FNP  LORazepam (ATIVAN) 1 MG tablet Take 1 tablet (1 mg total) by mouth every 6 (six) hours as needed for anxiety. 01/30/15  Yes [name], MD  metFORMIN (GLUCOPHAGE) 500 MG tablet Take 2 tablets (1,000 mg total) by mouth 2 (two) times daily with a meal. 11/26/16  Yes Scot Jun, FNP  Naftifine HCl 2 % CREA Apply 1 application topically daily. 10/25/16  Yes Neese, [place] M, NP  Oxycodone HCl 10 MG TABS Take 1 tablet (10 mg total) by mouth every 8 (eight) hours as needed (moderate or  severe pain). 09/29/16  Yes Nita Sells, MD  pravastatin (PRAVACHOL) 40 MG tablet Take 1 tablet (40 mg total) by mouth daily. 10/28/16  Yes Scot Jun, FNP  traMADol (ULTRAM) 50 MG tablet Take 1 tablet (50 mg total) by mouth every 6 (six) hours as needed. 10/31/16  Yes Recardo Evangelist, PA-C    Past Medical, Surgical Family and Social History reviewed and updated.    Objective:   Today's Vitals   01/01/17 1003  BP: 130/87  Pulse: 80  Resp: 16  Temp: 98.1 F (36.7 C)  TempSrc: Oral  SpO2: 100%  Weight: 266 lb (120.7 kg)  Height: '5\' 9"'$  (1.753 m)    Wt Readings from Last 3 Encounters:  01/01/17 266 lb (120.7 kg)  12/31/16 260 lb (117.9  kg)  11/26/16 273 lb 6.4 oz (124 kg)   Physical Exam  Constitutional: He is oriented to person, place, and time. He appears well-developed and well-nourished.  Cardiovascular: Normal rate, regular rhythm, normal heart sounds and intact distal pulses.   Pulmonary/Chest: Effort normal and breath sounds normal.  Musculoskeletal:       Feet:  Neurological: He is alert and oriented to person, place, and time.  Skin: Skin is warm and dry.  Psychiatric: He has a normal mood and affect. His behavior is normal. Judgment and thought content normal.   Assessment & Plan:  1. Ulcer of foot, unspecified laterality, unspecified ulcer stage [facility])- Ambulatory referral to Podiatry, continue antibiotics, and keep right foot clean/dry 2. Type 2 diabetes mellitus with complication, with long-term current use of insulin (HCC) - POCT glycosylated hemoglobin (Hb A1C)-8.2, continue Metformin and Lantus. Only administer humalog 3 units  If blood sugar is greater than 200.  RTC: 3 months for diabetes follow-up or sooner if needed.  Carroll Sage. Kenton Kingfisher, MSN, FNP-C [facility]  [address] Cower [place], [place] [address] [phone]

## 2017-01-06 ENCOUNTER — Encounter (HOSPITAL_COMMUNITY): Payer: Self-pay

## 2017-01-06 ENCOUNTER — Inpatient Hospital Stay (HOSPITAL_COMMUNITY)
Admission: EM | Admit: 2017-01-06 | Discharge: 2017-01-16 | DRG: 638 | Disposition: A | Discharge status: Home / Self Care | Payer: Medicare Other | Attending: Family Medicine | Admitting: Family Medicine

## 2017-01-06 ENCOUNTER — Emergency Department (HOSPITAL_COMMUNITY): Payer: Medicare Other

## 2017-01-06 DIAGNOSIS — E1159 Type 2 diabetes mellitus with other circulatory complications: Secondary | ICD-10-CM

## 2017-01-06 DIAGNOSIS — G894 Chronic pain syndrome: Secondary | ICD-10-CM | POA: Diagnosis not present

## 2017-01-06 DIAGNOSIS — Z79891 Long term (current) use of opiate analgesic: Secondary | ICD-10-CM | POA: Diagnosis not present

## 2017-01-06 DIAGNOSIS — E11621 Type 2 diabetes mellitus with foot ulcer: Secondary | ICD-10-CM

## 2017-01-06 DIAGNOSIS — E1121 Type 2 diabetes mellitus with diabetic nephropathy: Secondary | ICD-10-CM | POA: Diagnosis present

## 2017-01-06 DIAGNOSIS — E876 Hypokalemia: Secondary | ICD-10-CM | POA: Diagnosis not present

## 2017-01-06 DIAGNOSIS — Z794 Long term (current) use of insulin: Secondary | ICD-10-CM

## 2017-01-06 DIAGNOSIS — E1169 Type 2 diabetes mellitus with other specified complication: Secondary | ICD-10-CM | POA: Diagnosis not present

## 2017-01-06 DIAGNOSIS — E1142 Type 2 diabetes mellitus with diabetic polyneuropathy: Secondary | ICD-10-CM | POA: Diagnosis present

## 2017-01-06 DIAGNOSIS — M25569 Pain in unspecified knee: Secondary | ICD-10-CM | POA: Diagnosis not present

## 2017-01-06 DIAGNOSIS — G8929 Other chronic pain: Secondary | ICD-10-CM | POA: Diagnosis present

## 2017-01-06 DIAGNOSIS — E1165 Type 2 diabetes mellitus with hyperglycemia: Secondary | ICD-10-CM | POA: Diagnosis not present

## 2017-01-06 DIAGNOSIS — E11628 Type 2 diabetes mellitus with other skin complications: Secondary | ICD-10-CM | POA: Diagnosis not present

## 2017-01-06 DIAGNOSIS — I10 Essential (primary) hypertension: Secondary | ICD-10-CM | POA: Diagnosis present

## 2017-01-06 DIAGNOSIS — D638 Anemia in other chronic diseases classified elsewhere: Secondary | ICD-10-CM | POA: Diagnosis present

## 2017-01-06 DIAGNOSIS — Z7984 Long term (current) use of oral hypoglycemic drugs: Secondary | ICD-10-CM

## 2017-01-06 DIAGNOSIS — Z79899 Other long term (current) drug therapy: Secondary | ICD-10-CM | POA: Diagnosis not present

## 2017-01-06 DIAGNOSIS — L97509 Non-pressure chronic ulcer of other part of unspecified foot with unspecified severity: Secondary | ICD-10-CM

## 2017-01-06 DIAGNOSIS — E119 Type 2 diabetes mellitus without complications: Secondary | ICD-10-CM

## 2017-01-06 DIAGNOSIS — L97519 Non-pressure chronic ulcer of other part of right foot with unspecified severity: Secondary | ICD-10-CM | POA: Diagnosis not present

## 2017-01-06 DIAGNOSIS — L089 Local infection of the skin and subcutaneous tissue, unspecified: Secondary | ICD-10-CM | POA: Diagnosis present

## 2017-01-06 DIAGNOSIS — N179 Acute kidney failure, unspecified: Secondary | ICD-10-CM | POA: Diagnosis not present

## 2017-01-06 DIAGNOSIS — T368X5A Adverse effect of other systemic antibiotics, initial encounter: Secondary | ICD-10-CM | POA: Diagnosis not present

## 2017-01-06 DIAGNOSIS — Z882 Allergy status to sulfonamides status: Secondary | ICD-10-CM

## 2017-01-06 DIAGNOSIS — M545 Low back pain: Secondary | ICD-10-CM | POA: Diagnosis not present

## 2017-01-06 DIAGNOSIS — Z8614 Personal history of Methicillin resistant Staphylococcus aureus infection: Secondary | ICD-10-CM

## 2017-01-06 DIAGNOSIS — M869 Osteomyelitis, unspecified: Secondary | ICD-10-CM | POA: Diagnosis present

## 2017-01-06 DIAGNOSIS — N521 Erectile dysfunction due to diseases classified elsewhere: Secondary | ICD-10-CM

## 2017-01-06 DIAGNOSIS — L97511 Non-pressure chronic ulcer of other part of right foot limited to breakdown of skin: Secondary | ICD-10-CM

## 2017-01-06 DIAGNOSIS — G629 Polyneuropathy, unspecified: Secondary | ICD-10-CM | POA: Diagnosis not present

## 2017-01-06 DIAGNOSIS — B20 Human immunodeficiency virus [HIV] disease: Secondary | ICD-10-CM | POA: Diagnosis not present

## 2017-01-06 DIAGNOSIS — E08621 Diabetes mellitus due to underlying condition with foot ulcer: Secondary | ICD-10-CM

## 2017-01-06 HISTORY — DX: Type 2 diabetes mellitus with foot ulcer: L97.509

## 2017-01-06 HISTORY — DX: Non-pressure chronic ulcer of other part of unspecified foot with unspecified severity: L97.509

## 2017-01-06 HISTORY — DX: Type 2 diabetes mellitus with foot ulcer: E11.621

## 2017-01-06 LAB — CBC WITH DIFFERENTIAL/PLATELET
Basophils Absolute: 0 10*3/uL (ref 0.0–0.1)
Basophils Relative: 0 %
Eosinophils Absolute: 0.1 10*3/uL (ref 0.0–0.7)
Eosinophils Relative: 2 %
HCT: 34.8 % — ABNORMAL LOW (ref 39.0–52.0)
Hemoglobin: 11.6 g/dL — ABNORMAL LOW (ref 13.0–17.0)
Lymphocytes Relative: 30 %
Lymphs Abs: 2.2 10*3/uL (ref 0.7–4.0)
MCH: 28.4 pg (ref 26.0–34.0)
MCHC: 33.3 g/dL (ref 30.0–36.0)
MCV: 85.1 fL (ref 78.0–100.0)
Monocytes Absolute: 0.5 10*3/uL (ref 0.1–1.0)
Monocytes Relative: 7 %
Neutro Abs: 4.5 10*3/uL (ref 1.7–7.7)
Neutrophils Relative %: 61 %
Platelets: 251 10*3/uL (ref 150–400)
RBC: 4.09 MIL/uL — ABNORMAL LOW (ref 4.22–5.81)
RDW: 13.2 % (ref 11.5–15.5)
WBC: 7.3 10*3/uL (ref 4.0–10.5)

## 2017-01-06 LAB — COMPREHENSIVE METABOLIC PANEL
ALT: 12 U/L — ABNORMAL LOW (ref 17–63)
AST: 16 U/L (ref 15–41)
Albumin: 3.8 g/dL (ref 3.5–5.0)
Alkaline Phosphatase: 74 U/L (ref 38–126)
Anion gap: 9 (ref 5–15)
BUN: 14 mg/dL (ref 6–20)
CO2: 26 mmol/L (ref 22–32)
Calcium: 9.5 mg/dL (ref 8.9–10.3)
Chloride: 101 mmol/L (ref 101–111)
Creatinine, Ser: 1.12 mg/dL (ref 0.61–1.24)
GFR calc Af Amer: >60 mL/min (ref >60)
GFR calc non Af Amer: >60 mL/min (ref >60)
Glucose, Bld: 260 mg/dL — ABNORMAL HIGH (ref 65–99)
Potassium: 3.9 mmol/L (ref 3.5–5.1)
Sodium: 136 mmol/L (ref 135–145)
Total Bilirubin: 0.7 mg/dL (ref 0.3–1.2)
Total Protein: 8 g/dL (ref 6.5–8.1)

## 2017-01-06 LAB — I-STAT CG4 LACTIC ACID, ED: Lactic Acid, Venous: 1.01 mmol/L (ref 0.5–1.9)

## 2017-01-06 MED ORDER — METRONIDAZOLE IN NACL 5-0.79 MG/ML-% IV SOLN: 500.0000 mg | Freq: Three times a day (TID) | INTRAVENOUS | Status: DC

## 2017-01-06 MED ORDER — DEXTROSE 5 % IV SOLN: 2.0000 g | INTRAVENOUS | Status: DC

## 2017-01-06 NOTE — ED Triage Notes (Signed)
Pt arrives from home with c/o of worsening right foot ulcer; pt states he was hear a week ago and was given antibiotic but he lost the paper; pt states pain has increased; Foot has malodor on exam and noticeable brown leakage on sock from ulcer; pt  Has swollen right foot; pt able to ambulate to triage room; vitals WNL; pt c/o pain at 10/10; pt has hx of DM

## 2017-01-06 NOTE — Care Management (Addendum)
Patient has had 6 ED visits in the past 6 months for similar complaints. Patient is an active with the [facility] ([facility]) with last visit 9/26 by K. Harris NP.

## 2017-01-06 NOTE — ED Provider Notes (Signed)
[facility] DEPT Provider Note   CSN: [id number] Arrival date & time: 01/06/17  Keith [name]     History   Chief Complaint Chief Complaint  Patient presents with  . Foot Ulcer    HPI BIRAN MAYBERRY is a 51 y.o. male.  51 yo M With a past medical history of HIV comes in with a chief complaints of a right foot ulcer. This is been going on for the past weeks. Was seen in the ED about a week ago with similar complaint. Since then he feels that the wound has gotten significantly worse. He is unable to get his antibiotics filled. He has been having some yellowish drainage from the area. Having some subjective fevers and chills as well.   The history is provided by the patient.  Illness  This is a new problem. The current episode started more than 1 week ago. The problem occurs constantly. The problem has been gradually worsening. Pertinent negatives include no chest pain, no abdominal pain, no headaches and no shortness of breath. Nothing aggravates the symptoms. Nothing relieves the symptoms. He has tried nothing for the symptoms. The treatment provided no relief.    Past Medical History:  Diagnosis Date  . AIDS ([place])   . Chronic knee pain    right  . Chronic pain   . Diabetes mellitus   . Diabetes type 2, uncontrolled ([place])    HgA1c 17.6 (04/27/2010)  . Erectile dysfunction   . Genital warts   . HIV (human immunodeficiency virus infection) ([place]) 2009   CD4 count 100, VL 13800 (05/01/2010)  . Hypertension   . Osteomyelitis [facility])    h/o hand    Patient Active Problem List   Diagnosis Date Noted  . Dehydration 09/29/2016  . AKI (acute kidney injury) ([place]) 09/28/2016  . Diarrhea with dehydration 09/28/2016  . Nausea vomiting and diarrhea 09/28/2016  . Cellulitis and abscess of hand 06/04/2016  . Cellulitis of left hand 06/04/2016  . Onychomycosis of multiple toenails with type 2 diabetes mellitus ([place]) 08/29/2015  . MRSA carrier 04/20/2014  . Testosterone deficiency  04/20/2014  . Genital warts 02/22/2014  . HIV disease ([place])   . Insulin-requiring or dependent type II diabetes mellitus ([place]) 02/04/2014  . Dental anomaly 11/20/2012  . Arthritis of right knee 02/23/2012  . Hyperlipidemia 11/10/2011  . Chronic pain 08/07/2011  . Meralgia paraesthetica 04/23/2011  . ERECTILE DYSFUNCTION 08/22/2008  . Essential hypertension 05/19/2008    Past Surgical History:  Procedure Laterality Date  . HERNIA REPAIR    . I&D EXTREMITY Left 08/21/2014   Procedure: INCISION AND DRAINAGE LEFT SMALL FINGER;  Surgeon: Leanora Cover, MD;  Location: [facility];  Service: Orthopedics;  Laterality: Left;  . MINOR IRRIGATION AND DEBRIDEMENT OF WOUND Right 04/22/2014   Procedure: IRRIGATION AND DEBRIDEMENT OF RIGHT NECK ABCESS;  Surgeon: Jerrell Belfast, MD;  Location: [facility];  Service: ENT;  Laterality: Right;  . MULTIPLE EXTRACTIONS WITH ALVEOLOPLASTY N/A 01/18/2013   Procedure: MULTIPLE EXTRACION 3, 6, 7, 10, 11, 13, 21, 22, 27, 28, 29, 30 WITH ALVEOLOPLASTY;  Surgeon: Gae Bon, DDS;  Location: [facility];  Service: Oral Surgery;  Laterality: N/A;       Home Medications    Prior to Admission medications   Medication Sig Start Date End Date Taking? Authorizing Provider  abacavir-dolutegravir-lamiVUDine (TRIUMEQ) 009-38-182 MG tablet TAKE 1 TABLET BY MOUTH DAILY, STOP GENVOYA AND PREZISTA 09/16/16  Yes [name], MD  cyclobenzaprine (FLEXERIL) 10 MG tablet Take 1  tablet (10 mg total) by mouth 2 (two) times daily as needed for muscle spasms. 10/25/16  Yes Neese, [place], NP  darunavir-cobicistat (PREZCOBIX) 800-150 MG tablet Take 1 tablet by mouth daily. with food 09/16/16  Yes [name], MD  diclofenac sodium (VOLTAREN) 1 % GEL Apply 2 g topically 4 (four) times daily. Patient taking differently: Apply 2 g topically 4 (four) times daily as needed (pain).  10/31/16  Yes Scot Jun, FNP  Insulin Glargine (LANTUS SOLOSTAR) 100 UNIT/ML Solostar Pen Inject 60  Units into the skin daily at 10 pm. Patient taking differently: Inject 70 Units into the skin daily at 10 pm.  06/01/14  Yes Darrick Meigs, Marge Duncans, MD  Insulin Syringes, Disposable, U-100 0.3 ML MISC For administration of insulin TID. ICD E-11.9 10/29/16  Yes Scot Jun, FNP  LORazepam (ATIVAN) 1 MG tablet Take 1 tablet (1 mg total) by mouth every 6 (six) hours as needed for anxiety. 01/30/15  Yes [name], MD  metFORMIN (GLUCOPHAGE) 500 MG tablet Take 2 tablets (1,000 mg total) by mouth 2 (two) times daily with a meal. 11/26/16  Yes Scot Jun, FNP  naloxone [facility]) nasal spray 4 mg/0.1 mL Place 1 spray into the nose daily as needed (opioid overdose).   Yes [provider]  Oxycodone HCl 10 MG TABS Take 1 tablet (10 mg total) by mouth every 8 (eight) hours as needed (moderate or severe pain). Patient taking differently: Take 10 mg by mouth 4 (four) times daily.  09/29/16  Yes Nita Sells, MD  cephALEXin (KEFLEX) 500 MG capsule Take 1 capsule (500 mg total) by mouth 4 (four) times daily. Patient not taking: Reported on 01/06/2017 12/31/16 01/14/17  Russo, [place] N, PA-C  doxycycline (VIBRAMYCIN) 100 MG capsule Take 1 capsule (100 mg total) by mouth 2 (two) times daily. Patient not taking: Reported on 01/06/2017 12/31/16 01/14/17  Russo, [place] N, PA-C    Family History Family History  Problem Relation Age of Onset  . Hypertension Mother   . Arthritis Father   . Hypertension Father   . Hypertension Brother   . Cancer Maternal Grandmother 28       unknown type of cancer  . Depression Paternal Grandmother     Social History Social History  Substance Use Topics  . Smoking status: Never Smoker  . Smokeless tobacco: Never Used  . Alcohol use No     Allergies   Sulfa antibiotics   Review of Systems Review of Systems  Constitutional: Negative for chills and fever.  HENT: Negative for congestion and facial swelling.   Eyes: Negative for discharge and  visual disturbance.  Respiratory: Negative for shortness of breath.   Cardiovascular: Negative for chest pain and palpitations.  Gastrointestinal: Negative for abdominal pain, diarrhea and vomiting.  Musculoskeletal: Negative for arthralgias and myalgias.  Skin: Positive for wound. Negative for color change and rash.  Neurological: Negative for tremors, syncope and headaches.  Psychiatric/Behavioral: Negative for confusion and dysphoric mood.     Physical Exam Updated Vital Signs BP 123/83 (BP Location: Left Arm)   Pulse 85   Temp 97.8 F (36.6 C) (Oral)   Resp 18   SpO2 97%   Physical Exam  Constitutional: He is oriented to person, place, and time. He appears well-developed and well-nourished.  HENT:  Head: Normocephalic and atraumatic.  Eyes: Pupils are equal, round, and reactive to light. EOM are normal.  Neck: Normal range of motion. Neck supple. No JVD present.  Cardiovascular: Normal rate and regular rhythm.  Exam reveals no gallop and no friction rub.   No murmur heard. Pulmonary/Chest: No respiratory distress. He has no wheezes.  Abdominal: He exhibits no distension and no mass. There is no tenderness. There is no rebound and no guarding.  Musculoskeletal: Normal range of motion.  Large ulceration with some purulent discharge to the base of the first metatarsal.   Neurological: He is alert and oriented to person, place, and time.  Skin: No rash noted. No pallor.  Psychiatric: He has a normal mood and affect. His behavior is normal.  Nursing note and vitals reviewed.    ED Treatments / Results  Labs (all labs ordered are listed, but only abnormal results are displayed) Labs Reviewed  COMPREHENSIVE METABOLIC PANEL - Abnormal; Notable for the following:       Result Value   Glucose, Bld 260 (*)    ALT 12 (*)    All other components within normal limits  CBC WITH DIFFERENTIAL/PLATELET - Abnormal; Notable for the following:    RBC 4.09 (*)    Hemoglobin 11.6 (*)      HCT 34.8 (*)    All other components within normal limits  I-STAT CG4 LACTIC ACID, ED  I-STAT CG4 LACTIC ACID, ED    EKG  EKG Interpretation None       Radiology Dg Foot 2 Views Right  Result Date: 01/06/2017 CLINICAL DATA:  Soft tissue ulceration plantar site right fifth toe. EXAM: RIGHT FOOT - 2 VIEW COMPARISON:  None. FINDINGS: Mild hallux valgus deformity. No evidence of fracture dislocation. Minimal degenerative change over the midfoot. Mild soft tissue prominence and irregularity involving the plantar aspect at the level of the MTP joints on the lateral film slightly improved. No definite air within the soft tissues. No evidence of bone destruction to suggest osteomyelitis. IMPRESSION: Improving soft tissue prominence and irregularity along the plantar aspect of the foot at the level of the MTP joints. No significant air in the soft tissues and no evidence of osteomyelitis. Electronically Signed   By: Marin Olp M.D.   On: 01/06/2017 22:42    Procedures Procedures (including critical care time)  Medications Ordered in ED Medications  cefTRIAXone (ROCEPHIN) 2 g in dextrose 5 % 50 mL IVPB (not administered)    And  metroNIDAZOLE (FLAGYL) IVPB 500 mg (not administered)     Initial Impression / Assessment and Plan / ED Course  I have reviewed the triage vital signs and the nursing notes.  Pertinent labs & imaging results that were available during my care of the patient were reviewed by me and considered in my medical decision making (see chart for details).     51 yo M with a cc of a diabetic foot ulcer.  Going on for the past couple weeks. The patient is not doing well as an outpatient disease having difficulty getting his medications filled as well as having worsening of the wound. With subjective fevers and chills I will give him a dose IV antibiotics and discussed with the hospitalist.  The patients results and plan were reviewed and discussed.   Any x-rays  performed were independently reviewed by myself.   Differential diagnosis were considered with the presenting HPI.  Medications  cefTRIAXone (ROCEPHIN) 2 g in dextrose 5 % 50 mL IVPB (not administered)    And  metroNIDAZOLE (FLAGYL) IVPB 500 mg (not administered)    Vitals:   01/06/17 1916 01/06/17 1918  BP: 123/83   Pulse:  85   Resp: 18   Temp:  97.8 F (36.6 C)  TempSrc:  Oral  SpO2: 97%     Final diagnoses:  Diabetic foot infection ([place])    Admission/ observation were discussed with the admitting physician, patient and/or family and they are comfortable with the plan.    Final Clinical Impressions(s) / ED Diagnoses   Final diagnoses:  Diabetic foot infection [facility])    New Prescriptions New Prescriptions   No medications on file     Deno Etienne, DO 01/06/17 2318

## 2017-01-07 ENCOUNTER — Observation Stay (HOSPITAL_COMMUNITY): Payer: Medicare Other

## 2017-01-07 ENCOUNTER — Encounter (HOSPITAL_COMMUNITY): Payer: Self-pay | Admitting: Internal Medicine

## 2017-01-07 DIAGNOSIS — Z8614 Personal history of Methicillin resistant Staphylococcus aureus infection: Secondary | ICD-10-CM | POA: Diagnosis not present

## 2017-01-07 DIAGNOSIS — Z79891 Long term (current) use of opiate analgesic: Secondary | ICD-10-CM | POA: Diagnosis not present

## 2017-01-07 DIAGNOSIS — R609 Edema, unspecified: Secondary | ICD-10-CM | POA: Diagnosis not present

## 2017-01-07 DIAGNOSIS — Z882 Allergy status to sulfonamides status: Secondary | ICD-10-CM | POA: Diagnosis not present

## 2017-01-07 DIAGNOSIS — D638 Anemia in other chronic diseases classified elsewhere: Secondary | ICD-10-CM | POA: Diagnosis present

## 2017-01-07 DIAGNOSIS — E119 Type 2 diabetes mellitus without complications: Secondary | ICD-10-CM | POA: Diagnosis not present

## 2017-01-07 DIAGNOSIS — Z794 Long term (current) use of insulin: Secondary | ICD-10-CM

## 2017-01-07 DIAGNOSIS — E11628 Type 2 diabetes mellitus with other skin complications: Secondary | ICD-10-CM | POA: Diagnosis not present

## 2017-01-07 DIAGNOSIS — E1142 Type 2 diabetes mellitus with diabetic polyneuropathy: Secondary | ICD-10-CM | POA: Diagnosis present

## 2017-01-07 DIAGNOSIS — N179 Acute kidney failure, unspecified: Secondary | ICD-10-CM | POA: Diagnosis not present

## 2017-01-07 DIAGNOSIS — T368X5A Adverse effect of other systemic antibiotics, initial encounter: Secondary | ICD-10-CM | POA: Diagnosis not present

## 2017-01-07 DIAGNOSIS — I10 Essential (primary) hypertension: Secondary | ICD-10-CM | POA: Diagnosis not present

## 2017-01-07 DIAGNOSIS — L97511 Non-pressure chronic ulcer of other part of right foot limited to breakdown of skin: Secondary | ICD-10-CM | POA: Diagnosis not present

## 2017-01-07 DIAGNOSIS — M869 Osteomyelitis, unspecified: Secondary | ICD-10-CM | POA: Diagnosis present

## 2017-01-07 DIAGNOSIS — L97411 Non-pressure chronic ulcer of right heel and midfoot limited to breakdown of skin: Secondary | ICD-10-CM | POA: Diagnosis not present

## 2017-01-07 DIAGNOSIS — E11621 Type 2 diabetes mellitus with foot ulcer: Secondary | ICD-10-CM | POA: Diagnosis not present

## 2017-01-07 DIAGNOSIS — E1121 Type 2 diabetes mellitus with diabetic nephropathy: Secondary | ICD-10-CM | POA: Diagnosis present

## 2017-01-07 DIAGNOSIS — E1165 Type 2 diabetes mellitus with hyperglycemia: Secondary | ICD-10-CM | POA: Diagnosis present

## 2017-01-07 DIAGNOSIS — E876 Hypokalemia: Secondary | ICD-10-CM | POA: Diagnosis not present

## 2017-01-07 DIAGNOSIS — L089 Local infection of the skin and subcutaneous tissue, unspecified: Secondary | ICD-10-CM | POA: Diagnosis not present

## 2017-01-07 DIAGNOSIS — T368X5D Adverse effect of other systemic antibiotics, subsequent encounter: Secondary | ICD-10-CM | POA: Diagnosis not present

## 2017-01-07 DIAGNOSIS — L97519 Non-pressure chronic ulcer of other part of right foot with unspecified severity: Secondary | ICD-10-CM | POA: Diagnosis present

## 2017-01-07 DIAGNOSIS — Z79899 Other long term (current) drug therapy: Secondary | ICD-10-CM | POA: Diagnosis not present

## 2017-01-07 DIAGNOSIS — Z7984 Long term (current) use of oral hypoglycemic drugs: Secondary | ICD-10-CM | POA: Diagnosis not present

## 2017-01-07 DIAGNOSIS — E08621 Diabetes mellitus due to underlying condition with foot ulcer: Secondary | ICD-10-CM | POA: Diagnosis not present

## 2017-01-07 DIAGNOSIS — E1169 Type 2 diabetes mellitus with other specified complication: Secondary | ICD-10-CM | POA: Diagnosis present

## 2017-01-07 DIAGNOSIS — B20 Human immunodeficiency virus [HIV] disease: Secondary | ICD-10-CM | POA: Diagnosis not present

## 2017-01-07 DIAGNOSIS — G8929 Other chronic pain: Secondary | ICD-10-CM | POA: Diagnosis present

## 2017-01-07 LAB — COMPREHENSIVE METABOLIC PANEL
ALT: 13 U/L — ABNORMAL LOW (ref 17–63)
AST: 17 U/L (ref 15–41)
Albumin: 3.3 g/dL — ABNORMAL LOW (ref 3.5–5.0)
Alkaline Phosphatase: 65 U/L (ref 38–126)
Anion gap: 8 (ref 5–15)
BUN: 10 mg/dL (ref 6–20)
CO2: 29 mmol/L (ref 22–32)
Calcium: 9.2 mg/dL (ref 8.9–10.3)
Chloride: 100 mmol/L — ABNORMAL LOW (ref 101–111)
Creatinine, Ser: 1.02 mg/dL (ref 0.61–1.24)
GFR calc Af Amer: >60 mL/min (ref >60)
GFR calc non Af Amer: >60 mL/min (ref >60)
Glucose, Bld: 141 mg/dL — ABNORMAL HIGH (ref 65–99)
Potassium: 3.9 mmol/L (ref 3.5–5.1)
Sodium: 137 mmol/L (ref 135–145)
Total Bilirubin: 0.7 mg/dL (ref 0.3–1.2)
Total Protein: 7.4 g/dL (ref 6.5–8.1)

## 2017-01-07 LAB — BASIC METABOLIC PANEL
Anion gap: 7 (ref 5–15)
BUN: 13 mg/dL (ref 6–20)
CO2: 27 mmol/L (ref 22–32)
Calcium: 9.1 mg/dL (ref 8.9–10.3)
Chloride: 102 mmol/L (ref 101–111)
Creatinine, Ser: 1.04 mg/dL (ref 0.61–1.24)
GFR calc Af Amer: >60 mL/min (ref >60)
GFR calc non Af Amer: >60 mL/min (ref >60)
Glucose, Bld: 257 mg/dL — ABNORMAL HIGH (ref 65–99)
Potassium: 4.1 mmol/L (ref 3.5–5.1)
Sodium: 136 mmol/L (ref 135–145)

## 2017-01-07 LAB — CBC
HCT: 33.4 % — ABNORMAL LOW (ref 39.0–52.0)
Hemoglobin: 11.2 g/dL — ABNORMAL LOW (ref 13.0–17.0)
MCH: 28.2 pg (ref 26.0–34.0)
MCHC: 33.5 g/dL (ref 30.0–36.0)
MCV: 84.1 fL (ref 78.0–100.0)
Platelets: 237 10*3/uL (ref 150–400)
RBC: 3.97 MIL/uL — ABNORMAL LOW (ref 4.22–5.81)
RDW: 13.3 % (ref 11.5–15.5)
WBC: 5.3 10*3/uL (ref 4.0–10.5)

## 2017-01-07 LAB — GLUCOSE, CAPILLARY
Glucose-Capillary: 315 mg/dL — ABNORMAL HIGH (ref 65–99)
Glucose-Capillary: 152 mg/dL — ABNORMAL HIGH (ref 65–99)
Glucose-Capillary: 123 mg/dL — ABNORMAL HIGH (ref 65–99)
Glucose-Capillary: 174 mg/dL — ABNORMAL HIGH (ref 65–99)
Glucose-Capillary: 139 mg/dL — ABNORMAL HIGH (ref 65–99)

## 2017-01-07 LAB — SEDIMENTATION RATE: Sed Rate: 94 mm/hr — ABNORMAL HIGH (ref 0–16)

## 2017-01-07 MED ORDER — VANCOMYCIN HCL IN DEXTROSE 1-5 GM/200ML-% IV SOLN
1000.0000 mg | Freq: Three times a day (TID) | INTRAVENOUS | Status: DC
  Administered 2017-01-07 – 2017-01-08 (×3): 1000 mg via INTRAVENOUS
  Filled 2017-01-07 (×6): qty 200

## 2017-01-07 MED ORDER — ABACAVIR-DOLUTEGRAVIR-LAMIVUD 600-50-300 MG PO TABS
1.0000 | ORAL_TABLET | Freq: Every day | ORAL | Status: DC
  Administered 2017-01-07 – 2017-01-09 (×3): 1 via ORAL
  Filled 2017-01-07 (×4): qty 1

## 2017-01-07 MED ORDER — ACETAMINOPHEN 650 MG RE SUPP: 650.0000 mg | Freq: Four times a day (QID) | RECTAL | Status: DC | PRN

## 2017-01-07 MED ORDER — OXYCODONE HCL 5 MG PO TABS
10.0000 mg | ORAL_TABLET | Freq: Three times a day (TID) | ORAL | Status: DC | PRN
  Administered 2017-01-07 – 2017-01-15 (×14): 10 mg via ORAL
  Filled 2017-01-07 (×14): qty 2

## 2017-01-07 MED ORDER — ONDANSETRON HCL 4 MG PO TABS: 4.0000 mg | ORAL_TABLET | Freq: Four times a day (QID) | ORAL | Status: DC | PRN

## 2017-01-07 MED ORDER — DARUNAVIR-COBICISTAT 800-150 MG PO TABS
1.0000 | ORAL_TABLET | Freq: Every day | ORAL | Status: DC
  Administered 2017-01-07 – 2017-01-16 (×10): 1 via ORAL
  Filled 2017-01-07 (×10): qty 1

## 2017-01-07 MED ORDER — INSULIN ASPART 100 UNIT/ML ~~LOC~~ SOLN
0.0000 [IU] | Freq: Three times a day (TID) | SUBCUTANEOUS | Status: DC
  Administered 2017-01-07 – 2017-01-08 (×3): 2 [IU] via SUBCUTANEOUS

## 2017-01-07 MED ORDER — LORAZEPAM 1 MG PO TABS
1.0000 mg | ORAL_TABLET | Freq: Four times a day (QID) | ORAL | Status: DC | PRN
Start: 2017-01-07 — End: 2017-01-16

## 2017-01-07 MED ORDER — PIPERACILLIN-TAZOBACTAM 3.375 G IVPB
3.3750 g | Freq: Three times a day (TID) | INTRAVENOUS | Status: DC
  Administered 2017-01-07 – 2017-01-10 (×11): 3.375 g via INTRAVENOUS
  Filled 2017-01-07 (×12): qty 50

## 2017-01-07 MED ORDER — CYCLOBENZAPRINE HCL 10 MG PO TABS
10.0000 mg | ORAL_TABLET | Freq: Two times a day (BID) | ORAL | Status: DC | PRN
  Administered 2017-01-08: 10 mg via ORAL
  Filled 2017-01-07: qty 1

## 2017-01-07 MED ORDER — INSULIN GLARGINE 100 UNIT/ML ~~LOC~~ SOLN
70.0000 [IU] | Freq: Every day | SUBCUTANEOUS | Status: DC
  Administered 2017-01-07 – 2017-01-09 (×4): 70 [IU] via SUBCUTANEOUS
  Filled 2017-01-07 (×4): qty 0.7

## 2017-01-07 MED ORDER — ENOXAPARIN SODIUM 40 MG/0.4ML ~~LOC~~ SOLN: 40.0000 mg | Freq: Every day | SUBCUTANEOUS | Status: DC

## 2017-01-07 MED ORDER — ONDANSETRON HCL 4 MG/2ML IJ SOLN
4.0000 mg | Freq: Four times a day (QID) | INTRAMUSCULAR | Status: DC | PRN
  Administered 2017-01-12: 4 mg via INTRAVENOUS
  Filled 2017-01-07: qty 2

## 2017-01-07 MED ORDER — SODIUM CHLORIDE 0.9 % IV SOLN
2000.0000 mg | Freq: Once | INTRAVENOUS | Status: AC
  Administered 2017-01-07: 2000 mg via INTRAVENOUS
  Filled 2017-01-07: qty 2000

## 2017-01-07 MED ORDER — ENOXAPARIN SODIUM 60 MG/0.6ML ~~LOC~~ SOLN
55.0000 mg | Freq: Every day | SUBCUTANEOUS | Status: DC
  Administered 2017-01-07 – 2017-01-09 (×3): 55 mg via SUBCUTANEOUS
  Filled 2017-01-07 (×3): qty 0.6

## 2017-01-07 MED ORDER — ACETAMINOPHEN 325 MG PO TABS
650.0000 mg | ORAL_TABLET | Freq: Four times a day (QID) | ORAL | Status: DC | PRN
  Administered 2017-01-08: 650 mg via ORAL
  Filled 2017-01-07: qty 2

## 2017-01-07 NOTE — Progress Notes (Signed)
Pharmacy Antibiotic Note  Keith [name] is a 51 y.o. male admitted on 01/06/2017 with diabetic foot ulcer.  Pharmacy has been consulted for Vancomycin and Zosyn  dosing.  Plan: Vancomycin 2 g IV now, then 1 g IV q8h Zosyn 3.375 g IV q8h      Temp (24hrs), Avg:97.8 F (36.6 C), Min:97.8 F (36.6 C), Max:97.8 F (36.6 C)   Recent Labs Lab 12/31/16 1543 12/31/16 1612 12/31/16 1818 01/06/17 1922 01/06/17 1950  WBC 6.8  --   --  7.3  --   CREATININE 1.63*  --   --  1.12  --   LATICACIDVEN  --  2.14* 1.26  --  1.01    Estimated Creatinine Clearance: 99 mL/min (by C-G formula based on SCr of 1.12 mg/dL).    Allergies  Allergen Reactions  . Sulfa Antibiotics Itching    Caryl Pina 01/07/2017 12:35 AM

## 2017-01-07 NOTE — Progress Notes (Signed)
[name], male   DOB: [birth year], 51 y.o.   MRN: [id number] Patient was admitted early this morning for nonhealing right foot ulcer with question of osteomyelitis. Broad-spectrum antibiotics were started and MRI was ordered. I have reviewed his medical records including this morning's H&P. I had seen him at bedside and examined him and discussed the plan of care. Await MRI results. Continue antibiotics. Await wound care evaluation and recommendations. Repeat labs

## 2017-01-07 NOTE — Progress Notes (Signed)
Inpatient Diabetes Program Recommendations  AACE/ADA: New Consensus Statement on Inpatient Glycemic Control (2015)  Target Ranges:  Prepandial:   less than 140 mg/dL      Peak postprandial:   less than 180 mg/dL (1-2 hours)      Critically ill patients:  140 - 180 mg/dL   Lab Results  Component Value Date   GLUCAP 152 (H) 01/07/2017   HGBA1C 8.2 01/01/2017    Review of Glycemic Control  Results for JERAN, HILTZ (MRN [id number]) as of 01/07/2017 09:14  Ref. Range 01/07/2017 02:13 01/07/2017 07:39  Glucose-Capillary Latest Ref Range: 65 - 99 mg/dL 315 (H) 152 (H)    Diabetes history: Type 2 Outpatient Diabetes medications: Lantus 70 units qhs, Metformin 1000mg  bid Current orders for Inpatient glycemic control: Novolog 0-9 units tid, Lantus 70 units qpm  Inpatient Diabetes Program Recommendations: Consider adding Novolog 0-15 units tid  Gentry Fitz, RN, [place], [place], CDE Diabetes Coordinator Inpatient Diabetes Program  [phone] (Team Pager) [phone] ([place]) 01/07/2017 9:17 AM

## 2017-01-07 NOTE — Consult Note (Addendum)
[facility] Nurse wound consult note Reason for Consult: Consult requested for right foot wound.  MRI did not indicate abscess or osteomyelitis. Pt states site was previously a callous which cracked and opened. Pt is on systemic antibiotic coverage. Wound type: Full thickness wound to right plantar foot Measurement:2X3X.3cm, red moist wound bed, surrounded by white macerated skin to wound edges. Middle of wound is fluctuant when probed with a swab. Drainage (amount, consistency, odor)  Large amt strong smelling tan drainage. Periwound: Generalized edema and erythema surrounding the wound, pt has decreased sensation. Dressing procedure/placement/frequency: Aquacel to absorb drainage and provide antimicrobial benefits.  Gauze and kerlex to protect.  Discussed plan of care with patient and he verbalized understanding. Please re-consult if further assistance is needed.  Thank-you,  Julien Girt MSN, [place], [place], [place], [place]

## 2017-01-07 NOTE — ED Notes (Signed)
Patient is stable and ready to be transport to the floor at this time.  Report was called to 5N RN.  Belongings taken with the patient to the floor.   

## 2017-01-07 NOTE — H&P (Addendum)
History and Physical    Keith [name] WOE:[id number] DOB: 12-09-1965 DOA: 01/06/2017  PCP: Scot Jun, FNP  Patient coming from: Home.  Chief Complaint: Worsening foot ulcer.  HPI: Keith [name] is a 51 y.o. male with history of diabetes mellitus type 2, HIV, chronic anemia presents to the ER with complaints of worsening foot ulcer. Patient has been having the foot ulcer for almost 2-3 months now. Patient states he was prescribed antibiotics but was unable to fill it. Recent PCPs notes states that he was referred to podiatry. Patient states over the last few days the foot has become more swollen with increasing discharge.   ED Course: The ER x-rays do not reveal any bone involvement. On exam patient has around 3 cm plantar foot ulcer on the first metatarsal head. Given the patient's immunocompromised status patient is being admitted for further observation.  Review of Systems: As per HPI, rest all negative.   Past Medical History:  Diagnosis Date  . AIDS ([place])   . Chronic knee pain    right  . Chronic pain   . Diabetes mellitus   . Diabetes type 2, uncontrolled ([place])    HgA1c 17.6 (04/27/2010)  . Erectile dysfunction   . Genital warts   . HIV (human immunodeficiency virus infection) ([place]) 2009   CD4 count 100, VL 13800 (05/01/2010)  . Hypertension   . Osteomyelitis [facility])    h/o hand    Past Surgical History:  Procedure Laterality Date  . HERNIA REPAIR    . I&D EXTREMITY Left 08/21/2014   Procedure: INCISION AND DRAINAGE LEFT SMALL FINGER;  Surgeon: Leanora Cover, MD;  Location: [place];  Service: Orthopedics;  Laterality: Left;  . MINOR IRRIGATION AND DEBRIDEMENT OF WOUND Right 04/22/2014   Procedure: IRRIGATION AND DEBRIDEMENT OF RIGHT NECK ABCESS;  Surgeon: Jerrell Belfast, MD;  Location: [place];  Service: ENT;  Laterality: Right;  . MULTIPLE EXTRACTIONS WITH ALVEOLOPLASTY N/A 01/18/2013   Procedure: MULTIPLE EXTRACION 3, 6, 7, 10, 11, 13, 21, 22, 27, 28, 29, 30 WITH  ALVEOLOPLASTY;  Surgeon: Gae Bon, DDS;  Location: [place];  Service: Oral Surgery;  Laterality: N/A;     reports that he has never smoked. He has never used smokeless tobacco. He reports that he does not drink alcohol or use drugs.  Allergies  Allergen Reactions  . Sulfa Antibiotics Itching    Family History  Problem Relation Age of Onset  . Hypertension Mother   . Arthritis Father   . Hypertension Father   . Hypertension Brother   . Cancer Maternal Grandmother 78       unknown type of cancer  . Depression Paternal Grandmother     Prior to Admission medications   Medication Sig Start Date End Date Taking? Authorizing Provider  abacavir-dolutegravir-lamiVUDine (TRIUMEQ) 600-50-300 MG tablet TAKE 1 TABLET BY MOUTH DAILY, STOP GENVOYA AND PREZISTA 09/16/16  Yes [name], MD  cyclobenzaprine (FLEXERIL) 10 MG tablet Take 1 tablet (10 mg total) by mouth 2 (two) times daily as needed for muscle spasms. 10/25/16  Yes Neese, [place], NP  darunavir-cobicistat (PREZCOBIX) 800-150 MG tablet Take 1 tablet by mouth daily. with food 09/16/16  Yes [name], MD  diclofenac sodium (VOLTAREN) 1 % GEL Apply 2 g topically 4 (four) times daily. Patient taking differently: Apply 2 g topically 4 (four) times daily as needed (pain).  10/31/16  Yes Scot Jun, FNP  Insulin Glargine (LANTUS SOLOSTAR) 100 UNIT/ML Solostar  Pen Inject 60 Units into the skin daily at 10 pm. Patient taking differently: Inject 70 Units into the skin daily at 10 pm.  06/01/14  Yes Darrick Meigs, Marge Duncans, MD  Insulin Syringes, Disposable, U-100 0.3 ML MISC For administration of insulin TID. ICD E-11.9 10/29/16  Yes Scot Jun, FNP  LORazepam (ATIVAN) 1 MG tablet Take 1 tablet (1 mg total) by mouth every 6 (six) hours as needed for anxiety. 01/30/15  Yes [name], MD  metFORMIN (GLUCOPHAGE) 500 MG tablet Take 2 tablets (1,000 mg total) by mouth 2 (two) times daily with a meal. 11/26/16  Yes Scot Jun, FNP  naloxone [facility]) nasal spray 4 mg/0.1 mL Place 1 spray into the nose daily as needed (opioid overdose).   Yes [provider]  Oxycodone HCl 10 MG TABS Take 1 tablet (10 mg total) by mouth every 8 (eight) hours as needed (moderate or severe pain). Patient taking differently: Take 10 mg by mouth 4 (four) times daily.  09/29/16  Yes Nita Sells, MD  cephALEXin (KEFLEX) 500 MG capsule Take 1 capsule (500 mg total) by mouth 4 (four) times daily. Patient not taking: Reported on 01/06/2017 12/31/16 01/14/17  Russo, [place] N, PA-C  doxycycline (VIBRAMYCIN) 100 MG capsule Take 1 capsule (100 mg total) by mouth 2 (two) times daily. Patient not taking: Reported on 01/06/2017 12/31/16 01/14/17  Russo, [place] N, PA-C    Physical Exam: Vitals:   01/06/17 1916 01/06/17 1918  BP: 123/83   Pulse: 85   Resp: 18   Temp:  97.8 F (36.6 C)  TempSrc:  Oral  SpO2: 97%       Constitutional: Moderately built and nourished. Vitals:   01/06/17 1916 01/06/17 1918  BP: 123/83   Pulse: 85   Resp: 18   Temp:  97.8 F (36.6 C)  TempSrc:  Oral  SpO2: 97%    Eyes: Anicteric no pallor. ENMT: No discharge from the ears eyes nose and mouth. Neck: No mass felt. No neck rigidity. Respiratory: No rhonchi or crepitations. Cardiovascular: S1-S2 heard no murmurs appreciated. Abdomen: Soft nontender bowel sounds present. No guarding or rigidity. Musculoskeletal: Mildly swollen right foot. Skin: Ulceration on the plantar aspect of the right foot measuring around 3 cm with some discharge and possible eschar. Neurologic: Alert awake oriented to time place and person. Moves all extremities. Psychiatric: Appears normal. Normal affect.   Labs on Admission: I have personally reviewed following labs and imaging studies  CBC:  Recent Labs Lab 12/31/16 1543 01/06/17 1922  WBC 6.8 7.3  NEUTROABS 4.6 4.5  HGB 10.8* 11.6*  HCT 32.3* 34.8*  MCV 84.6 85.1  PLT 276 024   Basic  Metabolic Panel:  Recent Labs Lab 12/31/16 1543 01/06/17 1922  NA 133* 136  K 3.8 3.9  CL 100* 101  CO2 26 26  GLUCOSE 340* 260*  BUN 19 14  CREATININE 1.63* 1.12  CALCIUM 9.4 9.5   GFR: Estimated Creatinine Clearance: 99 mL/min (by C-G formula based on SCr of 1.12 mg/dL). Liver Function Tests:  Recent Labs Lab 12/31/16 1543 01/06/17 1922  AST 19 16  ALT 12* 12*  ALKPHOS 62 74  BILITOT 0.7 0.7  PROT 7.6 8.0  ALBUMIN 3.7 3.8   No results for input(s): LIPASE, AMYLASE in the last 168 hours. No results for input(s): AMMONIA in the last 168 hours. Coagulation Profile: No results for input(s): INR, PROTIME in the last 168 hours. Cardiac Enzymes: No results for input(s): CKTOTAL,  CKMB, CKMBINDEX, TROPONINI in the last 168 hours. BNP (last 3 results) No results for input(s): PROBNP in the last 8760 hours. HbA1C: No results for input(s): HGBA1C in the last 72 hours. CBG: No results for input(s): GLUCAP in the last 168 hours. Lipid Profile: No results for input(s): CHOL, HDL, LDLCALC, TRIG, CHOLHDL, LDLDIRECT in the last 72 hours. Thyroid Function Tests: No results for input(s): TSH, T4TOTAL, FREET4, T3FREE, THYROIDAB in the last 72 hours. Anemia Panel: No results for input(s): VITAMINB12, FOLATE, FERRITIN, TIBC, IRON, RETICCTPCT in the last 72 hours. Urine analysis:    Component Value Date/Time   COLORURINE YELLOW 11/18/2016 1429   APPEARANCEUR CLOUDY (A) 11/18/2016 1429   LABSPEC >=1.030 01/01/2017 1032   PHURINE 5.5 01/01/2017 1032   GLUCOSEU 250 (A) 01/01/2017 1032   HGBUR SMALL (A) 01/01/2017 1032   BILIRUBINUR NEGATIVE 01/01/2017 1032   KETONESUR NEGATIVE 01/01/2017 1032   PROTEINUR 100 (A) 01/01/2017 1032   UROBILINOGEN 1.0 01/01/2017 1032   NITRITE NEGATIVE 01/01/2017 1032   LEUKOCYTESUR NEGATIVE 01/01/2017 1032   Sepsis Labs: @LABRCNTIP (procalcitonin:4,lacticidven:4) )No results found for this or any previous visit (from the past 240 hour(s)).    Radiological Exams on Admission: Dg Foot 2 Views Right  Result Date: 01/06/2017 CLINICAL DATA:  Soft tissue ulceration plantar site right fifth toe. EXAM: RIGHT FOOT - 2 VIEW COMPARISON:  None. FINDINGS: Mild hallux valgus deformity. No evidence of fracture dislocation. Minimal degenerative change over the midfoot. Mild soft tissue prominence and irregularity involving the plantar aspect at the level of the MTP joints on the lateral film slightly improved. No definite air within the soft tissues. No evidence of bone destruction to suggest osteomyelitis. IMPRESSION: Improving soft tissue prominence and irregularity along the plantar aspect of the foot at the level of the MTP joints. No significant air in the soft tissues and no evidence of osteomyelitis. Electronically Signed   By: Marin Olp M.D.   On: 01/06/2017 22:42     Assessment/Plan Principal Problem:   Diabetic foot ulcer associated with diabetes mellitus due to underlying condition [facility]) Active Problems:   Essential hypertension   Insulin-requiring or dependent type II diabetes mellitus ([place])   HIV disease ([place])   Osteomyelitis ([place])    1. Infected diabetic right foot ulcer - I have ordered sedimentation rate and an MRI of the foot to rule out osteomyelitis. I have for now place patient on vancomycin and Zosyn. Further recommendations based on MRI findings. Will get wound team consult. Check doppler. 2. Diabetes mellitus type 2 - patient takes Lantus 70 units at bedtime as confirmed with patient. Patient's last hemoglobin A1c in February 2018 was 13. Closely follow CBGs. 3. HIV being followed by Dr. [name]. Last CD4 count and every 2018 was 210. On antiretrovirals. 4. Anemia of chronic disease - if there is not significant fall in hemoglobin and further workup as outpatient. 5. Chronic pain and oxycodone. 6. History of hypertension per the chart. Patient is not on any medications. Closely follow blood pressure trends.  I have  reviewed patient's old charts and labs.   DVT prophylaxis: Lovenox. Code Status: Full code.  Family Communication: Discussed with patient.  Disposition Plan: Home.  Consults called: Wound team.  Admission status: Observation.    Rise Patience MD [facility] Pager [phone].  If 7PM-7AM, please contact night-coverage [email or web address] Password [email or web address]  01/07/2017, 12:33 AM

## 2017-01-08 ENCOUNTER — Encounter (HOSPITAL_COMMUNITY): Payer: Self-pay | Admitting: *Deleted

## 2017-01-08 ENCOUNTER — Inpatient Hospital Stay (HOSPITAL_COMMUNITY): Payer: Medicare Other

## 2017-01-08 ENCOUNTER — Ambulatory Visit: Payer: Medicare Other | Admitting: Podiatry

## 2017-01-08 DIAGNOSIS — R609 Edema, unspecified: Secondary | ICD-10-CM

## 2017-01-08 DIAGNOSIS — L089 Local infection of the skin and subcutaneous tissue, unspecified: Secondary | ICD-10-CM

## 2017-01-08 DIAGNOSIS — E11628 Type 2 diabetes mellitus with other skin complications: Secondary | ICD-10-CM

## 2017-01-08 DIAGNOSIS — B20 Human immunodeficiency virus [HIV] disease: Secondary | ICD-10-CM

## 2017-01-08 LAB — CBC WITH DIFFERENTIAL/PLATELET
Basophils Absolute: 0 10*3/uL (ref 0.0–0.1)
Basophils Relative: 0 %
Eosinophils Absolute: 0.2 10*3/uL (ref 0.0–0.7)
Eosinophils Relative: 2 %
HCT: 33.2 % — ABNORMAL LOW (ref 39.0–52.0)
Hemoglobin: 10.9 g/dL — ABNORMAL LOW (ref 13.0–17.0)
Lymphocytes Relative: 21 %
Lymphs Abs: 1.6 10*3/uL (ref 0.7–4.0)
MCH: 27.9 pg (ref 26.0–34.0)
MCHC: 32.8 g/dL (ref 30.0–36.0)
MCV: 84.9 fL (ref 78.0–100.0)
Monocytes Absolute: 0.4 10*3/uL (ref 0.1–1.0)
Monocytes Relative: 5 %
Neutro Abs: 5.4 10*3/uL (ref 1.7–7.7)
Neutrophils Relative %: 72 %
Platelets: 237 10*3/uL (ref 150–400)
RBC: 3.91 MIL/uL — ABNORMAL LOW (ref 4.22–5.81)
RDW: 13.6 % (ref 11.5–15.5)
WBC: 7.6 10*3/uL (ref 4.0–10.5)

## 2017-01-08 LAB — GLUCOSE, CAPILLARY
Glucose-Capillary: 86 mg/dL (ref 65–99)
Glucose-Capillary: 233 mg/dL — ABNORMAL HIGH (ref 65–99)
Glucose-Capillary: 151 mg/dL — ABNORMAL HIGH (ref 65–99)
Glucose-Capillary: 102 mg/dL — ABNORMAL HIGH (ref 65–99)

## 2017-01-08 LAB — MAGNESIUM: Magnesium: 2 mg/dL (ref 1.7–2.4)

## 2017-01-08 LAB — C-REACTIVE PROTEIN: CRP: 1.8 mg/dL — ABNORMAL HIGH (ref <1.0)

## 2017-01-08 MED ORDER — INSULIN ASPART 100 UNIT/ML ~~LOC~~ SOLN: 0.0000 [IU] | Freq: Every day | SUBCUTANEOUS | Status: DC

## 2017-01-08 MED ORDER — VANCOMYCIN HCL IN DEXTROSE 1-5 GM/200ML-% IV SOLN
1000.0000 mg | Freq: Three times a day (TID) | INTRAVENOUS | Status: DC
  Administered 2017-01-08 – 2017-01-09 (×4): 1000 mg via INTRAVENOUS
  Filled 2017-01-08 (×5): qty 200

## 2017-01-08 MED ORDER — INSULIN ASPART 100 UNIT/ML ~~LOC~~ SOLN
0.0000 [IU] | Freq: Three times a day (TID) | SUBCUTANEOUS | Status: DC
  Administered 2017-01-08 (×2): 3 [IU] via SUBCUTANEOUS
  Administered 2017-01-09: 2 [IU] via SUBCUTANEOUS
  Administered 2017-01-09: 3 [IU] via SUBCUTANEOUS
  Administered 2017-01-10: 2 [IU] via SUBCUTANEOUS
  Administered 2017-01-10: 3 [IU] via SUBCUTANEOUS
  Administered 2017-01-11: 2 [IU] via SUBCUTANEOUS
  Administered 2017-01-12: 3 [IU] via SUBCUTANEOUS
  Administered 2017-01-13 (×2): 2 [IU] via SUBCUTANEOUS
  Administered 2017-01-14 (×2): 1 [IU] via SUBCUTANEOUS
  Administered 2017-01-15: 2 [IU] via SUBCUTANEOUS
  Administered 2017-01-15: 3 [IU] via SUBCUTANEOUS
  Administered 2017-01-15: 2 [IU] via SUBCUTANEOUS
  Administered 2017-01-16: 1 [IU] via SUBCUTANEOUS

## 2017-01-08 NOTE — Progress Notes (Signed)
PROGRESS NOTE   Keith [name]  EHU:[id number]    DOB: 06/17/1965    DOA: 01/06/2017  PCP: Scot Jun, FNP   I have briefly reviewed patients previous medical records in [facility].  Brief Narrative:  86. 51-year-old male with PMH of DM 2, HTN, HIV/AIDS (Dr. [name], ID), chronic pain, chronic anemia, presented to ED with worsening swelling and increase drainage from chronic right foot wound. Admitted for diabetic foot infection.   Assessment & Plan:   Principal Problem:   Diabetic foot ulcer associated with diabetes mellitus due to underlying condition ([place]) Active Problems:   Essential hypertension   Insulin-requiring or dependent type II diabetes mellitus ([place])   HIV disease ([place])   Osteomyelitis ([place])   1. Infected right diabetic foot ulcer/wound infection: MRI confirms soft tissue infection but no osteomyelitis or drainable abscess. Wound care as per [facility] RN recommendations. Continue IV vancomycin and Zosyn. Lower extremity venous Dopplers negative for DVT. Recommend outpatient orthopedic follow-up. 2. Type II DM with peripheral neuropathy: CBGs mildly uncontrolled and fluctuating. Continue Lantus and SSI. 3. HIV/AIDS: Outpatient follow-up with ID. Last CD4 count 210. Continue ART. 4. Anemia of chronic kidney disease: Stable. Chronic pain: Continue pain management. 5. Essential hypertension: Not on medications at home. Monitor.   DVT prophylaxis: Lovenox Code Status: Full Family Communication: None at bedside Disposition: DC home in the next 2-3 days.   Consultants:  None   Procedures:  None  Antimicrobials:  IV vancomycin and Zosyn    Subjective: Reports improvement in pain in his right foot. Decreased drainage.   ROS: Denies fever or chills. No chest pain or dyspnea. No dizziness or lightheadedness.  Objective:  Vitals:   01/07/17 1500 01/07/17 2001 01/08/17 0659 01/08/17 1300  BP: (!) 90/59 131/84 111/73 (!) 155/84  Pulse: 72 74 74 80  Resp:  16 16 16 16   Temp: 98.5 F (36.9 C) 98.6 F (37 C) 97.9 F (36.6 C) 97.9 F (36.6 C)  TempSrc: Oral Oral Oral Oral  SpO2: 97% 100% 99% 100%  Weight:        Examination:  General exam: Pleasant middle-aged male, moderately built and nourished, lying comfortably supine in bed. Respiratory system: Clear to auscultation. Respiratory effort normal. Cardiovascular system: S1 & S2 heard, RRR. No JVD, murmurs, rubs, gallops or clicks. No pedal edema. Gastrointestinal system: Abdomen is nondistended, soft and nontender. No organomegaly or masses felt. Normal bowel sounds heard. Central nervous system: Alert and oriented. No focal neurological deficits. Extremities: Symmetric 5 x 5 power. Skin: Approximately 3 cm diameter chronic wound noted on plantar aspect of first MTP. Edges are thickened with surrounding macerated skin. Mild diffuse edema of the dorsum of the foot without significant erythema or increased warmth. No drainage, crepitus. Psychiatry: Judgement and insight appear normal. Mood & affect appropriate.     Data Reviewed: I have personally reviewed following labs and imaging studies  CBC:  Recent Labs Lab 01/06/17 1922 01/07/17 0438 01/08/17 0523  WBC 7.3 5.3 7.6  NEUTROABS 4.5  --  5.4  HGB 11.6* 11.2* 10.9*  HCT 34.8* 33.4* 33.2*  MCV 85.1 84.1 84.9  PLT 251 237 785   Basic Metabolic Panel:  Recent Labs Lab 01/06/17 1922 01/07/17 0438 01/07/17 1002 01/08/17 0523  NA 136 136 137  --   K 3.9 4.1 3.9  --   CL 101 102 100*  --   CO2 26 27 29   --   GLUCOSE 260* 257* 141*  --  BUN 14 13 10   --   CREATININE 1.12 1.04 1.02  --   CALCIUM 9.5 9.1 9.2  --   MG  --   --   --  2.0   Liver Function Tests:  Recent Labs Lab 01/06/17 1922 01/07/17 1002  AST 16 17  ALT 12* 13*  ALKPHOS 74 65  BILITOT 0.7 0.7  PROT 8.0 7.4  ALBUMIN 3.8 3.3*   CBG:  Recent Labs Lab 01/07/17 1627 01/07/17 2123 01/08/17 0559 01/08/17 1142 01/08/17 1628  GLUCAP 174* 139*  86 233* 151*    No results found for this or any previous visit (from the past 240 hour(s)).       Radiology Studies: Mr Foot Right Wo Contrast  Result Date: 01/07/2017 CLINICAL DATA:  Worsening right foot ulcer. Diabetes. Swollen right foot. EXAM: MRI OF THE RIGHT FOREFOOT WITHOUT CONTRAST TECHNIQUE: Multiplanar, multisequence MR imaging of the right forefoot was performed. No intravenous contrast was administered. COMPARISON:  Radiographs from 01/06/2017 FINDINGS: Bones/Joint/Cartilage No significant bony destructive findings or marrow edema to suggest osteomyelitis. Mild erosive arthropathy along the midfoot, Lisfranc joint, and proximal intermetatarsal articulations. First digit sesamoids appear unremarkable. Mild degenerative arthropathy of the first MTP joint. Ligaments Lisfranc ligament intact. Muscles and Tendons Low-level diffuse edema in the plantar musculature of the foot. Soft tissues Cutaneous ulceration plantar to the first MTP joint. There is potentially a miniscule fluid collection draining to the ulceration on image 13/6 but no drainable abscess. Localized cellulitis tracking into the great toe. Dorsal subcutaneous edema along the forefoot is nonspecific but could also be due to cellulitis. IMPRESSION: 1. Cutaneous ulceration plantar to the first MTP joint with surrounding cellulitis tracking into the great toe. Possible cellulitis along the dorsum of the foot. 2. Low-level diffuse edema in the plantar musculature of the foot. 3. No osteomyelitis or drainable abscess. 4. Erosive arthropathy in the midfoot, Lisfranc joint, along and along the proximal intermetatarsal articulations. Electronically Signed   By: Van Clines M.D.   On: 01/07/2017 07:57   Dg Foot 2 Views Right  Result Date: 01/06/2017 CLINICAL DATA:  Soft tissue ulceration plantar site right fifth toe. EXAM: RIGHT FOOT - 2 VIEW COMPARISON:  None. FINDINGS: Mild hallux valgus deformity. No evidence of fracture  dislocation. Minimal degenerative change over the midfoot. Mild soft tissue prominence and irregularity involving the plantar aspect at the level of the MTP joints on the lateral film slightly improved. No definite air within the soft tissues. No evidence of bone destruction to suggest osteomyelitis. IMPRESSION: Improving soft tissue prominence and irregularity along the plantar aspect of the foot at the level of the MTP joints. No significant air in the soft tissues and no evidence of osteomyelitis. Electronically Signed   By: Marin Olp M.D.   On: 01/06/2017 22:42        Scheduled Meds: . abacavir-dolutegravir-lamiVUDine  1 tablet Oral Daily  . darunavir-cobicistat  1 tablet Oral Q breakfast  . enoxaparin (LOVENOX) injection  55 mg Subcutaneous Daily  . insulin aspart  0-9 Units Subcutaneous TID WC  . insulin glargine  70 Units Subcutaneous Q2200   Continuous Infusions: . piperacillin-tazobactam (ZOSYN)  IV 3.375 g (01/08/17 0930)  . vancomycin 1,000 mg (01/08/17 1124)     LOS: 1 day     Keith Mincy, MD, FACP, FHM. [facility] Pager [phone]  If 7PM-7AM, please contact night-coverage [email or web address] Password [email or web address] 01/08/2017, 5:00 PM

## 2017-01-08 NOTE — Progress Notes (Addendum)
**  Preliminary report by tech**  Bilateral lower extremity venous duplex completed. There is no evidence of deep or superficial vein thrombosis involving the right and left lower extremities. All visualized vessels appear patent and compressible. There is no evidence of Baker's cysts bilaterally. Incidental findings are consistent with an enlarged, vascularized lymph node measuring 1.2 cm high by 3 cm wide by 4.1 cm long on the right.  01/08/17 1:37 PM Keith [name] RVT

## 2017-01-09 DIAGNOSIS — L97511 Non-pressure chronic ulcer of other part of right foot limited to breakdown of skin: Secondary | ICD-10-CM

## 2017-01-09 DIAGNOSIS — I10 Essential (primary) hypertension: Secondary | ICD-10-CM

## 2017-01-09 DIAGNOSIS — E08621 Diabetes mellitus due to underlying condition with foot ulcer: Secondary | ICD-10-CM

## 2017-01-09 LAB — GLUCOSE, CAPILLARY
Glucose-Capillary: 82 mg/dL (ref 65–99)
Glucose-Capillary: 161 mg/dL — ABNORMAL HIGH (ref 65–99)
Glucose-Capillary: 217 mg/dL — ABNORMAL HIGH (ref 65–99)
Glucose-Capillary: 170 mg/dL — ABNORMAL HIGH (ref 65–99)

## 2017-01-09 LAB — VANCOMYCIN, TROUGH: Vancomycin Tr: 41 ug/mL (ref 15–20)

## 2017-01-09 MED ORDER — DOXYCYCLINE HYCLATE 100 MG PO TABS: 100.0000 mg | ORAL_TABLET | Freq: Two times a day (BID) | ORAL | 0 refills | Status: DC

## 2017-01-09 MED ORDER — VANCOMYCIN HCL IN DEXTROSE 750-5 MG/150ML-% IV SOLN
750.0000 mg | Freq: Two times a day (BID) | INTRAVENOUS | Status: DC
  Filled 2017-01-09: qty 150

## 2017-01-09 MED ORDER — HYDRALAZINE HCL 20 MG/ML IJ SOLN
5.0000 mg | Freq: Three times a day (TID) | INTRAMUSCULAR | Status: DC | PRN
  Administered 2017-01-09 – 2017-01-10 (×2): 5 mg via INTRAVENOUS
  Filled 2017-01-09 (×2): qty 1

## 2017-01-09 NOTE — Progress Notes (Signed)
Pharmacy Antibiotic Note  Keith [name] is a 51 y.o. male admitted on 01/06/2017 with diabetic foot ulcer.  Pharmacy has been consulted for Vancomycin and Zosyn dosing.  On vancomycin regimen of 1g IV q8h, vanc trough obtained 10/4 was 41. Creatinine from 10/2 1.02 which has been stable  Plan: Hold further vancomycin doses today Reduce vancomycin dose to 750 mg IV q12 starting tomorrow  Check BMET tomorrow Continue Zosyn 3.375 g IV q8h   Height: 5\' 9"  (175.3 cm) Weight: 260 lb (117.9 kg) IBW/kg (Calculated) : 70.7  Temp (24hrs), Avg:98.4 F (36.9 C), Min:98.3 F (36.8 C), Max:98.4 F (36.9 C)   Recent Labs Lab 01/06/17 1922 01/06/17 1950 01/07/17 0438 01/07/17 1002 01/08/17 0523 01/09/17 1056  WBC 7.3  --  5.3  --  7.6  --   CREATININE 1.12  --  1.04 1.02  --   --   LATICACIDVEN  --  1.01  --   --   --   --   VANCOTROUGH  --   --   --   --   --  41*    Estimated Creatinine Clearance: 107.4 mL/min (by C-G formula based on SCr of 1.02 mg/dL).    Allergies  Allergen Reactions  . Sulfa Antibiotics Itching    Laqueta Jean  PharmD. Candidate  01/09/2017 1:14 PM    Heide Guile, PharmD, BCPS-AQ ID Clinical Pharmacist Pager [phone]

## 2017-01-09 NOTE — Consult Note (Signed)
ORTHOPAEDIC CONSULTATION  REQUESTING PHYSICIAN: Modena Jansky, MD  Chief Complaint: foul-smelling ulcer right great toe plantar aspect  HPI: Keith [name] is a 51 y.o. male who presents with ulcer plantar aspect first metatarsal head right foot. Patient states that he noticed the ulcer when he saw blood on his socks. He states his wife said there was a foul odor. Patient states that he is diabetic he states his hemoglobin A1c is greater than 9. Previous A1c's have been much higher.  Past Medical History:  Diagnosis Date  . AIDS ([place])   . Chronic knee pain    right  . Chronic pain   . Diabetes mellitus   . Diabetes type 2, uncontrolled ([place])    HgA1c 17.6 (04/27/2010)  . Diabetic foot ulcer ([place]) 01/2017   right foot  . Erectile dysfunction   . Genital warts   . HIV (human immunodeficiency virus infection) ([place]) 2009   CD4 count 100, VL 13800 (05/01/2010)  . Hypertension   . Osteomyelitis [facility])    h/o hand   Past Surgical History:  Procedure Laterality Date  . HERNIA REPAIR    . I&D EXTREMITY Left 08/21/2014   Procedure: INCISION AND DRAINAGE LEFT SMALL FINGER;  Surgeon: Leanora Cover, MD;  Location: [place];  Service: Orthopedics;  Laterality: Left;  . MINOR IRRIGATION AND DEBRIDEMENT OF WOUND Right 04/22/2014   Procedure: IRRIGATION AND DEBRIDEMENT OF RIGHT NECK ABCESS;  Surgeon: Jerrell Belfast, MD;  Location: [place];  Service: ENT;  Laterality: Right;  . MULTIPLE EXTRACTIONS WITH ALVEOLOPLASTY N/A 01/18/2013   Procedure: MULTIPLE EXTRACION 3, 6, 7, 10, 11, 13, 21, 22, 27, 28, 29, 30 WITH ALVEOLOPLASTY;  Surgeon: Gae Bon, DDS;  Location: [place];  Service: Oral Surgery;  Laterality: N/A;   Social History   Social History  . Marital status: Single    Spouse name: N/A  . Number of children: 73  . Years of education: 36   Social History Main Topics  . Smoking status: Never Smoker  . Smokeless tobacco: Never Used  . Alcohol use No  . Drug use: No  . Sexual  activity: Yes    Partners: Female     Comment: given condoms   Other Topics Concern  . None   Social History Narrative   Worked for the city of [place] for 18 years.   Unemployed.    Applying for disability.   Medicaid patient.   Family History  Problem Relation Age of Onset  . Hypertension Mother   . Arthritis Father   . Hypertension Father   . Hypertension Brother   . Cancer Maternal Grandmother 85       unknown type of cancer  . Depression Paternal Grandmother    - negative except otherwise stated in the family history section Allergies  Allergen Reactions  . Sulfa Antibiotics Itching   Prior to Admission medications   Medication Sig Start Date End Date Taking? Authorizing Provider  abacavir-dolutegravir-lamiVUDine (TRIUMEQ) 600-50-300 MG tablet TAKE 1 TABLET BY MOUTH DAILY, STOP GENVOYA AND PREZISTA 09/16/16  Yes [name], MD  cyclobenzaprine (FLEXERIL) 10 MG tablet Take 1 tablet (10 mg total) by mouth 2 (two) times daily as needed for muscle spasms. 10/25/16  Yes Neese, Ka[place], NP  darunavir-cobicistat (PREZCOBIX) 800-150 MG tablet Take 1 tablet by mouth daily. with food 09/16/16  Yes [name], MD  diclofenac sodium (VOLTAREN) 1 % GEL Apply 2 g topically 4 (four) times daily. Patient  taking differently: Apply 2 g topically 4 (four) times daily as needed (pain).  10/31/16  Yes Scot Jun, FNP  Insulin Glargine (LANTUS SOLOSTAR) 100 UNIT/ML Solostar Pen Inject 60 Units into the skin daily at 10 pm. Patient taking differently: Inject 70 Units into the skin daily at 10 pm.  06/01/14  Yes Darrick Meigs, Marge Duncans, MD  Insulin Syringes, Disposable, U-100 0.3 ML MISC For administration of insulin TID. ICD E-11.9 10/29/16  Yes Scot Jun, FNP  LORazepam (ATIVAN) 1 MG tablet Take 1 tablet (1 mg total) by mouth every 6 (six) hours as needed for anxiety. 01/30/15  Yes [name], MD  metFORMIN (GLUCOPHAGE) 500 MG tablet Take 2 tablets (1,000 mg total) by  mouth 2 (two) times daily with a meal. 11/26/16  Yes Scot Jun, FNP  naloxone [facility]) nasal spray 4 mg/0.1 mL Place 1 spray into the nose daily as needed (opioid overdose).   Yes [provider]  Oxycodone HCl 10 MG TABS Take 1 tablet (10 mg total) by mouth every 8 (eight) hours as needed (moderate or severe pain). Patient taking differently: Take 10 mg by mouth 4 (four) times daily.  09/29/16  Yes Nita Sells, MD  cephALEXin (KEFLEX) 500 MG capsule Take 1 capsule (500 mg total) by mouth 4 (four) times daily. Patient not taking: Reported on 01/06/2017 12/31/16 01/14/17  Russo, [place] N, PA-C  doxycycline (VIBRAMYCIN) 100 MG capsule Take 1 capsule (100 mg total) by mouth 2 (two) times daily. Patient not taking: Reported on 01/06/2017 12/31/16 01/14/17  Russo, [place] N, PA-C   No results found. - pertinent xrays, CT, MRI studies were reviewed and independently interpreted  Positive ROS: All other systems have been reviewed and were otherwise negative with the exception of those mentioned in the HPI and as above.  Physical Exam: General: Alert, no acute distress Psychiatric: Patient is competent for consent with normal mood and affect Lymphatic: No axillary or cervical lymphadenopathy Cardiovascular: No pedal edema Respiratory: No cyanosis, no use of accessory musculature GI: No organomegaly, abdomen is soft and non-tender  Skin: examination patient has a Wagner grade 1 ulcer beneath the first metatarsal head of the right foot.   Neurologic: Patient does not have protective sensation bilateral lower extremities.   MUSCULOSKELETAL:  patient has a palpable dorsalis pedis pulse. After informed consent the ulcer was debrided of skin and soft tissue back to bleeding viable granulation tissue. The ulcer is 2 cm in diameter 1 mm deep there is a very small opening in the middle aspect the wound that's about a millimeter in diameter. There is no ascending cellulitis no odor no  drainage no purulence.review of the MRI scan shows no abscess no osteomyelitis. No Charcot arthropathy.  Assessment: Diabetic insensate neuropathy with uncontrolled glucose with a Wagner grade 1 ulcer beneath the first metatarsal head right foot.  Plan: Plan: Discussed the importance of nonweightbearing. We will order a Darco shoe patient is to wash his foot with soap and water daily and change dry dressing daily. He will use a walker or crutches to unload pressure from his foot he will keep his foot elevated. Recommend discharging on doxycycline oral antibiotics with follow-up in my office in 1 week.orders are written for the shoe, antibiotics,an follow-up.  Thank you for the consult and the opportunity to see Keith [name], [place] [phone] 5:49 PM

## 2017-01-09 NOTE — Progress Notes (Signed)
Spoke with lab, pt Vanco trough 41, vanco previously hung stopped by this nurse, call placed to pharmacy to notify of above. [email or web address]

## 2017-01-09 NOTE — Progress Notes (Signed)
PROGRESS NOTE   Keith [name]  BJS:[id number]    DOB: 12-22-1965    DOA: 01/06/2017  PCP: Scot Jun, FNP   I have briefly reviewed patients previous medical records in [facility].  Brief Narrative:  60. 51-year-old male with PMH of DM 2, HTN, HIV/AIDS (Dr. [name], ID), chronic pain, chronic anemia, presented to ED with worsening swelling and increase drainage from chronic right foot wound. Admitted for diabetic foot infection.   Assessment & Plan:   Principal Problem:   Diabetic foot ulcer associated with diabetes mellitus due to underlying condition ([place]) Active Problems:   Essential hypertension   Insulin-requiring or dependent type II diabetes mellitus ([place])   HIV disease ([place])   Osteomyelitis ([place])   1. Infected right diabetic foot ulcer/wound infection: MRI confirms soft tissue infection but no osteomyelitis or drainable abscess. Wound care as per [facility] RN recommendations. Continue IV vancomycin(Supratherapeutic today and being held by pharmacy) and Zosyn. Lower extremity venous Dopplers negative for DVT. Concern for some fluctuance around the wound on 10/4 and hence requested orthopedics/[name] to consult in house. 2. Type II DM with peripheral neuropathy: CBGs mildly uncontrolled and fluctuating. Continue Lantus and SSI. Continue current regimen without change. 3. HIV/AIDS: Outpatient follow-up with ID. Last CD4 count 210. Continue ART. 4. Anemia of chronic kidney disease: Stable.  5. Chronic pain: Continue pain management. 6. Essential hypertension: Not on medications at home. Mildly uncontrolled at times. When necessary IV hydralazine.   DVT prophylaxis: Lovenox Code Status: Full Family Communication: None at bedside Disposition: DC home in the next 1-2 days.   Consultants:  Orthopedics  Procedures:  None  Antimicrobials:  IV vancomycin and Zosyn    Subjective: Ongoing pain at the foot wound site but no drainage.  ROS: Denies fever or chills.  No chest pain or dyspnea. No dizziness or lightheadedness.  Objective:  Vitals:   01/08/17 2233 01/09/17 0100 01/09/17 0706 01/09/17 1300  BP: (!) 132/91  (!) 143/94 (!) 154/108  Pulse: 95  78 81  Resp: 16  16 16   Temp: 98.3 F (36.8 C)  98.4 F (36.9 C) 98.4 F (36.9 C)  TempSrc: Oral  Oral Oral  SpO2: 100%  99% 100%  Weight:  117.9 kg (260 lb)    Height:  5\' 9"  (1.753 m)      Examination:  General exam: Pleasant middle-aged male, moderately built and nourished, lying comfortably supine in bed.Stable. Respiratory system: Clear to auscultation. Respiratory effort normal. Stable without change. Cardiovascular system: S1 & S2 heard, RRR. No JVD, murmurs, rubs, gallops or clicks. No pedal edema. Stable without change. Gastrointestinal system: Abdomen is nondistended, soft and nontender. No organomegaly or masses felt. Normal bowel sounds heard. Stable without change. Central nervous system: Alert and oriented. No focal neurological deficits. Extremities: Symmetric 5 x 5 power. Skin: Approximately 3 cm diameter chronic wound noted on plantar aspect of first MTP. Edges are thickened with surrounding macerated skin and new concern for some area of fluctuance. Mild diffuse edema of the dorsum of the foot without significant erythema or increased warmth. No drainage, crepitus. Please see picture below from 10/4 for details. Psychiatry: Judgement and insight appear normal. Mood & affect appropriate.       Data Reviewed: I have personally reviewed following labs and imaging studies  CBC:  Recent Labs Lab 01/06/17 1922 01/07/17 0438 01/08/17 0523  WBC 7.3 5.3 7.6  NEUTROABS 4.5  --  5.4  HGB 11.6* 11.2* 10.9*  HCT 34.8*  33.4* 33.2*  MCV 85.1 84.1 84.9  PLT 251 237 627   Basic Metabolic Panel:  Recent Labs Lab 01/06/17 1922 01/07/17 0438 01/07/17 1002 01/08/17 0523  NA 136 136 137  --   K 3.9 4.1 3.9  --   CL 101 102 100*  --   CO2 26 27 29   --   GLUCOSE 260* 257*  141*  --   BUN 14 13 10   --   CREATININE 1.12 1.04 1.02  --   CALCIUM 9.5 9.1 9.2  --   MG  --   --   --  2.0   Liver Function Tests:  Recent Labs Lab 01/06/17 1922 01/07/17 1002  AST 16 17  ALT 12* 13*  ALKPHOS 74 65  BILITOT 0.7 0.7  PROT 8.0 7.4  ALBUMIN 3.8 3.3*   CBG:  Recent Labs Lab 01/08/17 1142 01/08/17 1628 01/08/17 2236 01/09/17 0709 01/09/17 1119  GLUCAP 233* 151* 102* 82 161*    No results found for this or any previous visit (from the past 240 hour(s)).       Radiology Studies: No results found.      Scheduled Meds: . abacavir-dolutegravir-lamiVUDine  1 tablet Oral Daily  . darunavir-cobicistat  1 tablet Oral Q breakfast  . enoxaparin (LOVENOX) injection  55 mg Subcutaneous Daily  . insulin aspart  0-5 Units Subcutaneous QHS  . insulin aspart  0-9 Units Subcutaneous TID WC  . insulin glargine  70 Units Subcutaneous Q2200   Continuous Infusions: . piperacillin-tazobactam (ZOSYN)  IV 3.375 g (01/09/17 1134)  . [START ON 01/10/2017] vancomycin       LOS: 2 days     Kayzen Kendzierski, MD, FACP, FHM. [facility] Pager [phone]  If 7PM-7AM, please contact night-coverage [email or web address] Password [email or web address] 01/09/2017, 4:24 PM

## 2017-01-10 DIAGNOSIS — E876 Hypokalemia: Secondary | ICD-10-CM

## 2017-01-10 DIAGNOSIS — N179 Acute kidney failure, unspecified: Secondary | ICD-10-CM

## 2017-01-10 LAB — BASIC METABOLIC PANEL
Anion gap: 8 (ref 5–15)
BUN: 15 mg/dL (ref 6–20)
CO2: 26 mmol/L (ref 22–32)
Calcium: 8.9 mg/dL (ref 8.9–10.3)
Chloride: 101 mmol/L (ref 101–111)
Creatinine, Ser: 3.59 mg/dL — ABNORMAL HIGH (ref 0.61–1.24)
GFR calc Af Amer: 21 mL/min — ABNORMAL LOW (ref >60)
GFR calc non Af Amer: 18 mL/min — ABNORMAL LOW (ref >60)
Glucose, Bld: 68 mg/dL (ref 65–99)
Potassium: 3.4 mmol/L — ABNORMAL LOW (ref 3.5–5.1)
Sodium: 135 mmol/L (ref 135–145)

## 2017-01-10 LAB — VANCOMYCIN, RANDOM: Vancomycin Rm: 30

## 2017-01-10 LAB — GLUCOSE, CAPILLARY
Glucose-Capillary: 61 mg/dL — ABNORMAL LOW (ref 65–99)
Glucose-Capillary: 105 mg/dL — ABNORMAL HIGH (ref 65–99)
Glucose-Capillary: 207 mg/dL — ABNORMAL HIGH (ref 65–99)
Glucose-Capillary: 186 mg/dL — ABNORMAL HIGH (ref 65–99)
Glucose-Capillary: 146 mg/dL — ABNORMAL HIGH (ref 65–99)

## 2017-01-10 LAB — MRSA PCR SCREENING: MRSA by PCR: POSITIVE — AB

## 2017-01-10 MED ORDER — LAMIVUDINE 10 MG/ML PO SOLN
100.0000 mg | Freq: Every day | ORAL | Status: DC
  Administered 2017-01-10 – 2017-01-14 (×5): 100 mg via ORAL
  Filled 2017-01-10 (×5): qty 10

## 2017-01-10 MED ORDER — CHLORHEXIDINE GLUCONATE CLOTH 2 % EX PADS
6.0000 | MEDICATED_PAD | Freq: Every day | CUTANEOUS | Status: AC
  Administered 2017-01-11 – 2017-01-14 (×3): 6 via TOPICAL

## 2017-01-10 MED ORDER — POTASSIUM CHLORIDE CRYS ER 20 MEQ PO TBCR
20.0000 meq | EXTENDED_RELEASE_TABLET | Freq: Once | ORAL | Status: AC
  Administered 2017-01-10: 20 meq via ORAL
  Filled 2017-01-10: qty 1

## 2017-01-10 MED ORDER — SODIUM CHLORIDE 0.9 % IV SOLN
INTRAVENOUS | Status: DC
  Administered 2017-01-10 – 2017-01-11 (×2): via INTRAVENOUS

## 2017-01-10 MED ORDER — INSULIN GLARGINE 100 UNIT/ML ~~LOC~~ SOLN
60.0000 [IU] | Freq: Every day | SUBCUTANEOUS | Status: DC
  Administered 2017-01-10: 60 [IU] via SUBCUTANEOUS
  Filled 2017-01-10: qty 0.6

## 2017-01-10 MED ORDER — ABACAVIR SULFATE 300 MG PO TABS
600.0000 mg | ORAL_TABLET | Freq: Every day | ORAL | Status: DC
  Administered 2017-01-10 – 2017-01-14 (×5): 600 mg via ORAL
  Filled 2017-01-10 (×5): qty 2

## 2017-01-10 MED ORDER — ENOXAPARIN SODIUM 40 MG/0.4ML ~~LOC~~ SOLN
40.0000 mg | Freq: Every day | SUBCUTANEOUS | Status: DC
  Filled 2017-01-10: qty 0.4

## 2017-01-10 MED ORDER — MUPIROCIN 2 % EX OINT
1.0000 "application " | TOPICAL_OINTMENT | Freq: Two times a day (BID) | CUTANEOUS | Status: AC
  Administered 2017-01-10 – 2017-01-15 (×9): 1 via NASAL
  Filled 2017-01-10 (×4): qty 22

## 2017-01-10 MED ORDER — DOLUTEGRAVIR SODIUM 50 MG PO TABS
50.0000 mg | ORAL_TABLET | Freq: Every day | ORAL | Status: DC
  Administered 2017-01-10 – 2017-01-14 (×5): 50 mg via ORAL
  Filled 2017-01-10 (×5): qty 1

## 2017-01-10 MED ORDER — DOXYCYCLINE HYCLATE 100 MG PO TABS
100.0000 mg | ORAL_TABLET | Freq: Two times a day (BID) | ORAL | Status: DC
  Administered 2017-01-10 – 2017-01-16 (×13): 100 mg via ORAL
  Filled 2017-01-10 (×13): qty 1

## 2017-01-10 NOTE — Care Management Important Message (Signed)
Important Message  Patient Details  Name: Keith [name] MRN: [id number] Date of Birth: 1965/09/02   Medicare Important Message Given:  Yes    Tinaya Ceballos 01/10/2017, 12:22 PM

## 2017-01-10 NOTE — Evaluation (Signed)
Physical Therapy Evaluation Patient Details Name: Keith [name] MRN: [id number] DOB: 03/13/1966 Today's Date: 01/10/2017   History of Present Illness  Pt is a 51 y.o. male admitted on 01/06/17 with worsening swelling and increase drainage from chronic right foot wound; admitted for diabetic foot infection of plantar aspect first metatarsal head right foot. Pertinent PMH includes HTN, HIV, DM, AIDS, osteomyelitis.     Clinical Impression  Pt presents with pain and an overall decrease in functional mobility secondary to above. PTA, pt  lives alone and is indep with all mobility; reports he has family/friends who can provide 24/7 assist if needed. Educ on NWB precautions, positioning, boot application, and importance of mobility. Today, pt required max encouragement to participate with therapy. Today, only amb 15' in room with RW, despite good technique and ability to maintain NWB precautions; required min guard for balance; feel pt is self-limiting despite clear potential for more mobility this session. Pt states he does not understand need to keep his precautions, educated on this repeatedly. Pt would benefit from continued acute PT services to maximize functional mobility and independence prior to d/c home.     Follow Up Recommendations No PT follow up;Supervision for mobility/OOB    Equipment Recommendations       Recommendations for Other Services       Precautions / Restrictions Precautions Precautions: Fall Required Braces or Orthoses: Other Brace/Splint Other Brace/Splint: R foot ulcer boot Restrictions Weight Bearing Restrictions: Yes RLE Weight Bearing: Non weight bearing Other Position/Activity Restrictions: No order; R foot NWB per ortho note      Mobility  Bed Mobility Overal bed mobility: Modified Independent                Transfers Overall transfer level: Needs assistance Equipment used: Rolling walker (2 wheeled) Transfers: Sit to/from Stand Sit to  Stand: Min guard         General transfer comment: Stood x3 from bed and chair with RW and min guard for balance; cues for hand placement. Overall, good technique and ability to maintain R foot NWB  Ambulation/Gait Ambulation/Gait assistance: Min guard;Min assist Ambulation Distance (Feet): 15 Feet Assistive device: Rolling walker (2 wheeled)   Gait velocity: Decreased Gait velocity interpretation: <1.8 ft/sec, indicative of risk for recurrent falls General Gait Details: Amb in room with RW and min guard for balance; 1x LOB requiring minA to correct. Pt able to hold RLE in air to maintain NWB precautions while utilizing hop-to pattern with RW, but does not understand why he has to keep his foot NWB despite max education. Pt unwilling to amb any further despite max encouragement; pt self-limiting  Stairs            Wheelchair Mobility    Modified Rankin (Stroke Patients Only)       Balance Overall balance assessment: Needs assistance Sitting-balance support: No upper extremity supported;Feet supported;Feet unsupported Sitting balance-Leahy Scale: Good     Standing balance support: Bilateral upper extremity supported;During functional activity;Single extremity supported Standing balance-Leahy Scale: Poor Standing balance comment: Reliant on UE support                             Pertinent Vitals/Pain Pain Assessment: Faces Faces Pain Scale: Hurts even more Pain Location: R foot Pain Descriptors / Indicators: Aching;Discomfort Pain Intervention(s): Premedicated before session;Monitored during session    [place] expects to be discharged to:: Private residence Living Arrangements: Other relatives Available Help  at Discharge: Family;Friend(s);Available PRN/intermittently;Available 24 hours/day Type of Home: House Home Access: Stairs to enter Entrance Stairs-Rails: Psychiatric nurse of Steps: 4 Home Layout: One  level Home Equipment: None Additional Comments: Pt reports living alone, but supposedly has family/friends who can check in on him and/or stay with him 24/7 if needed    Prior Function Level of Independence: Independent               Hand Dominance        Extremity/Trunk Assessment   Upper Extremity Assessment Upper Extremity Assessment: Overall WFL for tasks assessed    Lower Extremity Assessment Lower Extremity Assessment: Overall WFL for tasks assessed;RLE deficits/detail RLE Deficits / Details: R foot ulcer    Cervical / Trunk Assessment Cervical / Trunk Assessment: Normal  Communication   Communication: No difficulties  Cognition Arousal/Alertness: Awake/alert Behavior During Therapy: WFL for tasks assessed/performed Overall Cognitive Status: Within Functional Limits for tasks assessed                                        General Comments      Exercises     Assessment/Plan    PT Assessment Patient needs continued PT services  PT Problem List Decreased activity tolerance;Decreased balance;Decreased mobility;Decreased knowledge of use of DME;Pain       PT Treatment Interventions DME instruction;Gait training;Stair training;Functional mobility training;Therapeutic activities;Therapeutic exercise;Balance training;Patient/family education    PT Goals (Current goals can be found in the Care Plan section)  Acute Rehab PT Goals Patient Stated Goal: "Don't go home until I feel ready" PT Goal Formulation: With patient Time For Goal Achievement: 01/24/17 Potential to Achieve Goals: Good    Frequency Min 3X/week   Barriers to discharge        Co-evaluation               AM-PAC PT "6 Clicks" Daily Activity  Outcome Measure Difficulty turning over in bed (including adjusting bedclothes, sheets and blankets)?: None Difficulty moving from lying on back to sitting on the side of the bed? : None Difficulty sitting down on and standing  up from a chair with arms (e.g., wheelchair, bedside commode, etc,.)?: None Help needed moving to and from a bed to chair (including a wheelchair)?: A Little Help needed walking in hospital room?: A Little Help needed climbing 3-5 steps with a railing? : A Lot 6 Click Score: 20    End of Session Equipment Utilized During Treatment: Gait belt Activity Tolerance: Patient tolerated treatment well;Other (comment) (Self-limiting) Patient left: in chair;with call bell/phone within reach Nurse Communication: Mobility status PT Visit Diagnosis: Other abnormalities of gait and mobility (R26.89);Pain Pain - Right/Left: Right Pain - part of body: Ankle and joints of foot    Time: 1137-1203 PT Time Calculation (min) (ACUTE ONLY): 26 min   Charges:   PT Evaluation $PT Eval Moderate Complexity: 1 Mod PT Treatments $Therapeutic Activity: 8-22 mins   PT G Codes:       Mabeline Caras, PT, DPT Acute Rehab Services  Pager: [place] 01/10/2017, 12:17 PM

## 2017-01-10 NOTE — Progress Notes (Signed)
PROGRESS NOTE   Keith [name]  JQZ:[id number]    DOB: September 26, 1965    DOA: 01/06/2017  PCP: Scot Jun, FNP   I have briefly reviewed patients previous medical records in [facility].  Brief Narrative:  51. 51-year-old male with PMH of DM 2, HTN, HIV/AIDS (Dr. [name], ID), chronic pain, chronic anemia, presented to ED with worsening swelling and increase drainage from chronic right foot wound. Admitted for diabetic foot infection.   Assessment & Plan:   Principal Problem:   Diabetic foot ulcer associated with diabetes mellitus due to underlying condition ([place]) Active Problems:   Essential hypertension   Insulin-requiring or dependent type II diabetes mellitus ([place])   HIV disease ([place])   Osteomyelitis ([place])   1. Infected right diabetic foot ulcer/wound infection: MRI confirms soft tissue infection but no osteomyelitis or drainable abscess. Wound care as per [facility] RN recommendations. Lower extremity venous Dopplers negative for DVT. Concern for some fluctuance around the wound on 10/4 and hence requested orthopedics/[name] to consult in house.Dr. Jess Barters input appreciated and recommends nonweightbearing, Darco shoe, walker or crutches to unload pressure from his foot, topical wound/foot care, oral doxycycline and follow-up in his office in one week. Completed 5 days of Zosyn and 4 days of vancomycin. IV antibiotics discontinued. Oral doxycycline. 2. Acute kidney injury/vancomycin toxicity: Vancomycin level 41 on 10/4, down to 30. Vancomycin discontinued. Creatinine has gone up from 1.02 to 3.59. Aggressive IV fluids, strict intake output and follow BMP. 3. Type II DM with peripheral neuropathy: CBGs mildly uncontrolled and fluctuating. Fluctuating inpatient control. Had a hypoglycemic range CBG early morning 10/5. Due to worsening renal insufficiency, reduce Lantus to 60 units at bedtime. Continue SSI. Monitor closely. 4. HIV/AIDS: Outpatient follow-up with ID. Last CD4 count 210.  Continue ART. 5. Anemia of chronic kidney disease: Stable.  6. Chronic pain: Continue pain management. 7. Essential hypertension: Not on medications at home. Mildly uncontrolled at times. When necessary IV hydralazine. Start amlodipine 2.5 MG daily 8. Hypokalemia: Replace and follow.   DVT prophylaxis: Lovenox Code Status: Full Family Communication: None at bedside Disposition: DC home pending medical improvement.   Consultants:  Orthopedics  Procedures:  None  Antimicrobials:  IV vancomycin and Zosyn -discontinued. Oral doxycycline.   Subjective: States that his foot feels better with improved pain and no drainage. Able to ambulate from his bed to the bathroom without significant discomfort.  ROS: Denies fever or chills. No chest pain or dyspnea. No dizziness or lightheadedness.  Objective:  Vitals:   01/09/17 0706 01/09/17 1300 01/09/17 1955 01/10/17 0450  BP: (!) 143/94 (!) 154/108 (!) 170/94 (!) 155/79  Pulse: 78 81 95 88  Resp: 16 16 16 16   Temp: 98.4 F (36.9 C) 98.4 F (36.9 C) 99.2 F (37.3 C) 98.7 F (37.1 C)  TempSrc: Oral Oral Oral Oral  SpO2: 99% 100% 100% 100%  Weight:      Height:        Examination:  General exam: Pleasant middle-aged male, moderately built and nourished, lying comfortably supine in bed.Stable without change Respiratory system: Clear to auscultation. No increased work of breathing. Cardiovascular system: S1 and S2 heard, RRR. No JVD, murmurs or pedal edema. Gastrointestinal system: Abdomen is nondistended, soft and nontender. No organomegaly or masses felt. Normal bowel sounds heard. Stable without change Central nervous system: Alert and oriented. No focal neurological deficits. Stable without change Extremities: Symmetric 5 x 5 power. Stable without change Skin: Approximately 3 cm diameter chronic wound noted  on plantar aspect of first MTP. Edges are thickened with surrounding macerated skin and new concern for some area of  fluctuance. Mild diffuse edema of the dorsum of the foot without significant erythema or increased warmth. No drainage, crepitus. Please see picture below from 10/4 for details. No significant change since yesterday. Psychiatry: Judgement and insight appear normal. Mood & affect appropriate.       Data Reviewed: I have personally reviewed following labs and imaging studies  CBC:  Recent Labs Lab 01/06/17 1922 01/07/17 0438 01/08/17 0523  WBC 7.3 5.3 7.6  NEUTROABS 4.5  --  5.4  HGB 11.6* 11.2* 10.9*  HCT 34.8* 33.4* 33.2*  MCV 85.1 84.1 84.9  PLT 251 237 559   Basic Metabolic Panel:  Recent Labs Lab 01/06/17 1922 01/07/17 0438 01/07/17 1002 01/08/17 0523 01/10/17 0601  NA 136 136 137  --  135  K 3.9 4.1 3.9  --  3.4*  CL 101 102 100*  --  101  CO2 26 27 29   --  26  GLUCOSE 260* 257* 141*  --  68  BUN 14 13 10   --  15  CREATININE 1.12 1.04 1.02  --  3.59*  CALCIUM 9.5 9.1 9.2  --  8.9  MG  --   --   --  2.0  --    Liver Function Tests:  Recent Labs Lab 01/06/17 1922 01/07/17 1002  AST 16 17  ALT 12* 13*  ALKPHOS 74 65  BILITOT 0.7 0.7  PROT 8.0 7.4  ALBUMIN 3.8 3.3*   CBG:  Recent Labs Lab 01/09/17 1635 01/09/17 2123 01/10/17 0622 01/10/17 0718 01/10/17 1117  GLUCAP 217* 170* 61* 105* 207*    Recent Results (from the past 240 hour(s))  MRSA PCR Screening     Status: Abnormal   Collection Time: 01/10/17 11:18 AM  Result Value Ref Range Status   MRSA by PCR POSITIVE (A) NEGATIVE Final    Comment:        The GeneXpert MRSA Assay (FDA approved for NASAL specimens only), is one component of a comprehensive MRSA colonization surveillance program. It is not intended to diagnose MRSA infection nor to guide or monitor treatment for MRSA infections. RESULT CALLED TO, READ BACK BY AND VERIFIED WITH: Urbano Heir RN 13:30 01/10/17 ([email or web address])          Radiology Studies: No results found.      Scheduled Meds: . abacavir  600 mg Oral Daily    And  . dolutegravir  50 mg Oral Daily   And  . lamiVUDine  100 mg Oral Daily  . darunavir-cobicistat  1 tablet Oral Q breakfast  . doxycycline  100 mg Oral Q12H  . enoxaparin (LOVENOX) injection  40 mg Subcutaneous Daily  . insulin aspart  0-5 Units Subcutaneous QHS  . insulin aspart  0-9 Units Subcutaneous TID WC  . insulin glargine  60 Units Subcutaneous Q2200   Continuous Infusions: . sodium chloride       LOS: 3 days     Neftali Abair, MD, FACP, FHM. [facility] Pager [phone]  If 7PM-7AM, please contact night-coverage [email or web address] Password [email or web address] 01/10/2017, 2:14 PM

## 2017-01-10 NOTE — Progress Notes (Signed)
Pharmacy Antibiotic Note  Keith [name] is a 51 y.o. male admitted on 01/06/2017 with diabetic foot ulcer.  Pharmacy has been consulted for Vancomycin and Zosyn dosing. SCr jumped up significantly 1.02>>3.59. Doses were held this morning and a random vancomycin level was checked. It remains elevated at 30.   Plan: Hold vancomycin - consider checking another trough over the weekend depending on Scr trend   Height: 5\' 9"  (175.3 cm) Weight: 260 lb (117.9 kg) IBW/kg (Calculated) : 70.7  Temp (24hrs), Avg:98.8 F (37.1 C), Min:98.4 F (36.9 C), Max:99.2 F (37.3 C)   Recent Labs Lab 01/06/17 1922 01/06/17 1950 01/07/17 0438 01/07/17 1002 01/08/17 0523 01/09/17 1056 01/10/17 0601 01/10/17 0814  WBC 7.3  --  5.3  --  7.6  --   --   --   CREATININE 1.12  --  1.04 1.02  --   --  3.59*  --   LATICACIDVEN  --  1.01  --   --   --   --   --   --   VANCOTROUGH  --   --   --   --   --  11*  --   --   VANCORANDOM  --   --   --   --   --   --   --  30    Estimated Creatinine Clearance: 30.5 mL/min (A) (by C-G formula based on SCr of 3.59 mg/dL (H)).    Allergies  Allergen Reactions  . Sulfa Antibiotics Itching    Salome Arnt, PharmD, BCPS Phone #: [phone] from [place] to 3:30pm All other times, call [place] x [phone] 01/10/2017 9:16 AM

## 2017-01-10 NOTE — Progress Notes (Signed)
Dressing was placed last night to right foot since there was no dressing present during assessment.

## 2017-01-10 NOTE — Progress Notes (Signed)
Inpatient Diabetes Program Recommendations  AACE/ADA: New Consensus Statement on Inpatient Glycemic Control (2015)  Target Ranges:  Prepandial:   less than 140 mg/dL      Peak postprandial:   less than 180 mg/dL (1-2 hours)      Critically ill patients:  140 - 180 mg/dL   Results for [name], [name] (MRN [id number]) as of 01/10/2017 14:16  Ref. Range 01/09/2017 07:09 01/09/2017 11:19 01/09/2017 16:35 01/09/2017 21:23  Glucose-Capillary Latest Ref Range: 65 - 99 mg/dL 82 161 (H) 217 (H) 170 (H)   Results for [name], [name] (MRN [id number]) as of 01/10/2017 14:16  Ref. Range 01/10/2017 06:22 01/10/2017 07:18 01/10/2017 11:17  Glucose-Capillary Latest Ref Range: 65 - 99 mg/dL 61 (L) 105 (H) 207 (H)    Home DM Meds: Lantus 70 units QHS       Metformin 1000 mg BID  Current Insulin Orders: Lantus 70 units QHS      Novolog Sensitive Correction Scale/ SSI (0-9 units) TID AC + HS      MD- Note patient with Hypoglycemia this AM after getting Lantus 70 units last PM.  Please consider reducing Lantus to 60 units QHS     --Will follow patient during hospitalization--  Wyn Quaker RN, MSN, CDE Diabetes Coordinator Inpatient Glycemic Control Team Team Pager: [phone] (8a-5p)

## 2017-01-10 NOTE — Progress Notes (Signed)
PT Cancellation Note  Patient Details Name: Keith [name] MRN: [id number] DOB: [birth year]   Cancelled Treatment:    Reason Eval/Treat Not Completed: Patient declined, no reason specified Pt declined gait and stair training and reported he is "too emotional and wants to be left alone for a while". Therapist provided max encouragement. PT will continue to follow acutely.    [place] April, PTA Pager: [phone]   01/10/2017, 4:06 PM

## 2017-01-10 NOTE — Progress Notes (Signed)
CRITICAL VALUE ALERT  Critical Value:  MRSA PCR positive  Date & Time Notied:  10/5 1330  Provider Notified: Dr. Algis Liming  Orders Received/Actions taken: MRSA positive order set initiated, place on contact precautions

## 2017-01-11 LAB — BASIC METABOLIC PANEL
Anion gap: 10 (ref 5–15)
BUN: 16 mg/dL (ref 6–20)
CO2: 26 mmol/L (ref 22–32)
Calcium: 9.2 mg/dL (ref 8.9–10.3)
Chloride: 103 mmol/L (ref 101–111)
Creatinine, Ser: 3.55 mg/dL — ABNORMAL HIGH (ref 0.61–1.24)
GFR calc Af Amer: 21 mL/min — ABNORMAL LOW (ref >60)
GFR calc non Af Amer: 18 mL/min — ABNORMAL LOW (ref >60)
Glucose, Bld: 77 mg/dL (ref 65–99)
Potassium: 3.8 mmol/L (ref 3.5–5.1)
Sodium: 139 mmol/L (ref 135–145)

## 2017-01-11 LAB — VANCOMYCIN, RANDOM: Vancomycin Rm: 22

## 2017-01-11 LAB — GLUCOSE, CAPILLARY
Glucose-Capillary: 75 mg/dL (ref 65–99)
Glucose-Capillary: 67 mg/dL (ref 65–99)
Glucose-Capillary: 169 mg/dL — ABNORMAL HIGH (ref 65–99)
Glucose-Capillary: 126 mg/dL — ABNORMAL HIGH (ref 65–99)

## 2017-01-11 MED ORDER — HYDRALAZINE HCL 20 MG/ML IJ SOLN: 10.0000 mg | Freq: Four times a day (QID) | INTRAMUSCULAR | Status: DC | PRN

## 2017-01-11 MED ORDER — AMLODIPINE BESYLATE 5 MG PO TABS
5.0000 mg | ORAL_TABLET | Freq: Every day | ORAL | Status: DC
  Administered 2017-01-11: 5 mg via ORAL
  Filled 2017-01-11: qty 1

## 2017-01-11 MED ORDER — ENOXAPARIN SODIUM 30 MG/0.3ML ~~LOC~~ SOLN
30.0000 mg | Freq: Every day | SUBCUTANEOUS | Status: DC
  Administered 2017-01-11 – 2017-01-13 (×3): 30 mg via SUBCUTANEOUS
  Filled 2017-01-11 (×3): qty 0.3

## 2017-01-11 MED ORDER — HYDRALAZINE HCL 20 MG/ML IJ SOLN
10.0000 mg | Freq: Once | INTRAMUSCULAR | Status: AC
  Administered 2017-01-11: 10 mg via INTRAVENOUS
  Filled 2017-01-11: qty 1

## 2017-01-11 MED ORDER — INSULIN GLARGINE 100 UNIT/ML ~~LOC~~ SOLN
50.0000 [IU] | Freq: Every day | SUBCUTANEOUS | Status: DC
  Filled 2017-01-11: qty 0.5

## 2017-01-11 MED ORDER — INSULIN GLARGINE 100 UNIT/ML ~~LOC~~ SOLN
20.0000 [IU] | Freq: Every day | SUBCUTANEOUS | Status: DC
  Administered 2017-01-11 – 2017-01-15 (×5): 20 [IU] via SUBCUTANEOUS
  Filled 2017-01-11 (×6): qty 0.2

## 2017-01-11 MED ORDER — SODIUM CHLORIDE 0.9 % IV SOLN
INTRAVENOUS | Status: DC
  Administered 2017-01-11 (×2): via INTRAVENOUS

## 2017-01-11 NOTE — Progress Notes (Signed)
PT Cancellation Note  Patient Details Name: Keith [name] MRN: [id number] DOB: 1965/07/16   Cancelled Treatment:    Reason Eval/Treat Not Completed: Patient declined, no reason specified. Pt requesting therapy return around 2:30. States he is not up to getting out of bed today. When asked for a reason, pt reported he was down due to some personal problems. Advised pt may not be able to check back until 3. Pt agreeable.   Benjiman Core, PTA Pager [phone] Acute Rehab   Allena Katz 01/11/2017, 1:22 PM

## 2017-01-11 NOTE — Progress Notes (Addendum)
PROGRESS NOTE   Keith [name]  VOZ:[id number]    DOB: 01-17-1966    DOA: 01/06/2017  PCP: Scot Jun, FNP   I have briefly reviewed patients previous medical records in [facility].  Brief Narrative:  11. 51-year-old male with PMH of DM 2, HTN, HIV/AIDS (Dr. [name], ID), chronic pain, chronic anemia, presented to ED with worsening swelling and increase drainage from chronic right foot wound. Admitted for diabetic foot infection.   Assessment & Plan:   Principal Problem:   Diabetic foot ulcer associated with diabetes mellitus due to underlying condition ([place]) Active Problems:   Essential hypertension   Insulin-requiring or dependent type II diabetes mellitus ([place])   HIV disease ([place])   Osteomyelitis ([place])   1. Infected right diabetic foot ulcer/wound infection: MRI confirms soft tissue infection but no osteomyelitis or drainable abscess. Wound care as per [facility] RN recommendations. Lower extremity venous Dopplers negative for DVT. Concern for some fluctuance around the wound on 10/4 and hence requested orthopedics/[name] to consult in house.Dr. Jess Barters input appreciated and recommends nonweightbearing, Darco shoe, walker or crutches to unload pressure from his foot, topical wound/foot care, oral doxycycline and follow-up in his office in one week. Completed 5 days of Zosyn and 4 days of vancomycin. IV antibiotics discontinued. Oral doxycycline.Stable. 2. Acute kidney injury/vancomycin toxicity: Vancomycin level 41 on 10/4, down to 30. Vancomycin discontinued. Creatinine has gone up from 1.02 to 3.59. Aggressive IV fluids, strict intake output and follow BMP. Creatinine has plateaued in the 3.5 range. Nonoliguric. Discussed with patient and he verbalizes understanding. Discussed with nephrologist on call 10/6 and continue current management. 3. Type II DM with peripheral neuropathy: CBGs mildly uncontrolled and fluctuating. Fluctuating inpatient control. Had a hypoglycemic range  CBG early morning 10/5. Due to worsening renal insufficiency, had reduced Lantus to 60 units at bedtime on 10/5. CBG this morning 75 and lunchtime CBG 67. Concern for hypoglycemia. Will reduce bedtime Lantus to 20 units at bedtime and then rapidly increase if CBGs start to increase. Likely related to acute kidney injury. 4. HIV/AIDS: Outpatient follow-up with ID. Last CD4 count 210. Continue ART. 5. Anemia of chronic kidney disease: Stable.  6. Chronic pain: Continue pain management. 7. Essential hypertension: Not on medications at home. Uncontrolled. When necessary IV hydralazine. Started amlodipine today at 5 MG daily. May have to titrate up further. 8. Hypokalemia: Replaced   DVT prophylaxis: Lovenox Code Status: Full Family Communication: None at bedside Disposition: DC home pending medical improvement.   Consultants:  Orthopedics  Procedures:  None  Antimicrobials:  IV vancomycin and Zosyn -discontinued. Oral doxycycline.   Subjective: Reports that he does not feel well but unable to specify. No pain reported. As per RN, no acute issues.  ROS: Denies fever or chills. No chest pain or dyspnea. No dizziness or lightheadedness.  Objective:  Vitals:   01/10/17 2133 01/11/17 0001 01/11/17 0143 01/11/17 0554  BP: (!) 153/102 (!) 164/115 (!) 146/85 (!) 141/91  Pulse:    82  Resp:    18  Temp:      TempSrc:      SpO2:    100%  Weight:      Height:        Examination:  General exam: Pleasant middle-aged male, moderately built and nourished, lying comfortably supine in bed. Stable Respiratory system: Clear to auscultation. No increased work of breathing. Stable Cardiovascular system: S1 and S2 heard, RRR. No JVD, murmurs or pedal edema. No change/stable. Gastrointestinal system:  Abdomen is nondistended, soft and nontender. No organomegaly or masses felt. Normal bowel sounds heard. Stable. Central nervous system: Alert and oriented. No focal neurological deficits. No  change. Extremities: Symmetric 5 x 5 power. Stable without change Skin: Approximately 3 cm diameter chronic wound noted on plantar aspect of first MTP. Edges are thickened with surrounding macerated skin and new concern for some area of fluctuance. Mild diffuse edema of the dorsum of the foot without significant erythema or increased warmth. No drainage, crepitus. Please see picture below from 10/4 for details. No significant change since yesterday. Psychiatry: Judgement and insight appear normal. Mood & affect appropriate.       Data Reviewed: I have personally reviewed following labs and imaging studies  CBC:  Recent Labs Lab 01/06/17 1922 01/07/17 0438 01/08/17 0523  WBC 7.3 5.3 7.6  NEUTROABS 4.5  --  5.4  HGB 11.6* 11.2* 10.9*  HCT 34.8* 33.4* 33.2*  MCV 85.1 84.1 84.9  PLT 251 237 308   Basic Metabolic Panel:  Recent Labs Lab 01/06/17 1922 01/07/17 0438 01/07/17 1002 01/08/17 0523 01/10/17 0601 01/11/17 0440  NA 136 136 137  --  135 139  K 3.9 4.1 3.9  --  3.4* 3.8  CL 101 102 100*  --  101 103  CO2 26 27 29   --  26 26  GLUCOSE 260* 257* 141*  --  68 77  BUN 14 13 10   --  15 16  CREATININE 1.12 1.04 1.02  --  3.59* 3.55*  CALCIUM 9.5 9.1 9.2  --  8.9 9.2  MG  --   --   --  2.0  --   --    Liver Function Tests:  Recent Labs Lab 01/06/17 1922 01/07/17 1002  AST 16 17  ALT 12* 13*  ALKPHOS 74 65  BILITOT 0.7 0.7  PROT 8.0 7.4  ALBUMIN 3.8 3.3*   CBG:  Recent Labs Lab 01/10/17 1117 01/10/17 1720 01/10/17 1953 01/11/17 0601 01/11/17 1213  GLUCAP 207* 186* 146* 75 67    Recent Results (from the past 240 hour(s))  MRSA PCR Screening     Status: Abnormal   Collection Time: 01/10/17 11:18 AM  Result Value Ref Range Status   MRSA by PCR POSITIVE (A) NEGATIVE Final    Comment:        The GeneXpert MRSA Assay (FDA approved for NASAL specimens only), is one component of a comprehensive MRSA colonization surveillance program. It is not intended  to diagnose MRSA infection nor to guide or monitor treatment for MRSA infections. RESULT CALLED TO, READ BACK BY AND VERIFIED WITH: Urbano Heir RN 13:30 01/10/17 ([email or web address])          Radiology Studies: No results found.      Scheduled Meds: . abacavir  600 mg Oral Daily   And  . dolutegravir  50 mg Oral Daily   And  . lamiVUDine  100 mg Oral Daily  . Chlorhexidine Gluconate Cloth  6 each Topical Q0600  . darunavir-cobicistat  1 tablet Oral Q breakfast  . doxycycline  100 mg Oral Q12H  . enoxaparin (LOVENOX) injection  30 mg Subcutaneous Daily  . insulin aspart  0-9 Units Subcutaneous TID WC  . insulin glargine  50 Units Subcutaneous Q2200  . mupirocin ointment  1 application Nasal BID   Continuous Infusions: . sodium chloride 125 mL/hr at 01/11/17 1051     LOS: 4 days     Gamal Todisco, MD, FACP, FHM. [facility] Pager  [phone]  If 7PM-7AM, please contact night-coverage [email or web address] Password [email or web address] 01/11/2017, 4:21 PM

## 2017-01-11 NOTE — Progress Notes (Signed)
Physical Therapy Treatment Patient Details Name: Keith [name] MRN: [id number] DOB: 1966-01-30 Today's Date: 01/11/2017    History of Present Illness Pt is a 51 y.o. male admitted on 01/06/17 with worsening swelling and increase drainage from chronic right foot wound; admitted for diabetic foot infection of plantar aspect first metatarsal head right foot. Pertinent PMH includes HTN, HIV, DM, AIDS, osteomyelitis.     PT Comments    On arrival to pt's room, pt stated he walked by himself earlier without boot on and applying weight to heel of R LE. Pt educated on the importance of walking with boot on and maintaining NWB on R LE. Pt agreeable to walk again with therapy, however requests to defer stairs until Monday. Pt is self limiting and has stated he is "feeling down" because of some personal problems. Offered to contact spiritual care services for pt, however pt declined. Plan to focus on stair training next session to maximize safety with mobility before d/c home. Will continue to follow acutely.    Follow Up Recommendations  No PT follow up;Supervision for mobility/OOB     Equipment Recommendations       Recommendations for Other Services       Precautions / Restrictions Precautions Precautions: Fall Required Braces or Orthoses: Other Brace/Splint Other Brace/Splint: R foot ulcer boot Restrictions Weight Bearing Restrictions: Yes RLE Weight Bearing: Non weight bearing Other Position/Activity Restrictions: No order; R foot NWB per ortho note    Mobility  Bed Mobility Overal bed mobility: Modified Independent                Transfers Overall transfer level: Needs assistance Equipment used: Rolling walker (2 wheeled) Transfers: Sit to/from Stand Sit to Stand: Min guard         General transfer comment: Cues for hand placement and WB status  Ambulation/Gait Ambulation/Gait assistance: Min guard Ambulation Distance (Feet): 50 Feet Assistive device: Rolling  walker (2 wheeled) Gait Pattern/deviations: Step-to pattern;Trunk flexed (hop to) Gait velocity: Decreased Gait velocity interpretation: Below normal speed for age/gender General Gait Details: Pt agreed to ambulate into hall with min guard for balance and cueing for WB status.    Stairs            Wheelchair Mobility    Modified Rankin (Stroke Patients Only)       Balance Overall balance assessment: Needs assistance Sitting-balance support: No upper extremity supported;Feet supported;Feet unsupported Sitting balance-Leahy Scale: Good     Standing balance support: Bilateral upper extremity supported;During functional activity;Single extremity supported Standing balance-Leahy Scale: Poor Standing balance comment: Reliant on UE support                            Cognition Arousal/Alertness: Awake/alert Behavior During Therapy: WFL for tasks assessed/performed Overall Cognitive Status: Within Functional Limits for tasks assessed                                 General Comments: Pt stating he is feeling down.      Exercises Total Joint Exercises Ankle Circles/Pumps: AROM;Right;10 reps;Seated    General Comments        Pertinent Vitals/Pain Pain Assessment: Faces Faces Pain Scale: Hurts little more Pain Location: R foot Pain Descriptors / Indicators: Aching;Discomfort Pain Intervention(s): Monitored during session;Limited activity within patient's tolerance    Home Living  Prior Function            PT Goals (current goals can now be found in the care plan section) Acute Rehab PT Goals Patient Stated Goal: "Don't go home until I feel ready" PT Goal Formulation: With patient Time For Goal Achievement: 01/24/17 Potential to Achieve Goals: Good Progress towards PT goals: Progressing toward goals    Frequency    Min 3X/week      PT Plan Current plan remains appropriate    Co-evaluation               AM-PAC PT "6 Clicks" Daily Activity  Outcome Measure  Difficulty turning over in bed (including adjusting bedclothes, sheets and blankets)?: None Difficulty moving from lying on back to sitting on the side of the bed? : None Difficulty sitting down on and standing up from a chair with arms (e.g., wheelchair, bedside commode, etc,.)?: None Help needed moving to and from a bed to chair (including a wheelchair)?: A Little Help needed walking in hospital room?: A Little Help needed climbing 3-5 steps with a railing? : A Lot 6 Click Score: 20    End of Session Equipment Utilized During Treatment: Gait belt Activity Tolerance: Patient tolerated treatment well Patient left: with call bell/phone within reach;in bed Nurse Communication: Mobility status PT Visit Diagnosis: Other abnormalities of gait and mobility (R26.89);Pain Pain - Right/Left: Right Pain - part of body: Ankle and joints of foot     Time: 6484-7207 PT Time Calculation (min) (ACUTE ONLY): 20 min  Charges:  $Gait Training: 8-22 mins                    G Codes:       Keith [name], [place] Pager [phone] Acute Rehab   Allena Katz 01/11/2017, 4:35 PM

## 2017-01-12 DIAGNOSIS — T368X5D Adverse effect of other systemic antibiotics, subsequent encounter: Secondary | ICD-10-CM

## 2017-01-12 LAB — BASIC METABOLIC PANEL
Anion gap: 8 (ref 5–15)
BUN: 17 mg/dL (ref 6–20)
CO2: 28 mmol/L (ref 22–32)
Calcium: 9.1 mg/dL (ref 8.9–10.3)
Chloride: 104 mmol/L (ref 101–111)
Creatinine, Ser: 3.45 mg/dL — ABNORMAL HIGH (ref 0.61–1.24)
GFR calc Af Amer: 22 mL/min — ABNORMAL LOW (ref >60)
GFR calc non Af Amer: 19 mL/min — ABNORMAL LOW (ref >60)
Glucose, Bld: 164 mg/dL — ABNORMAL HIGH (ref 65–99)
Potassium: 4.2 mmol/L (ref 3.5–5.1)
Sodium: 140 mmol/L (ref 135–145)

## 2017-01-12 LAB — GLUCOSE, CAPILLARY
Glucose-Capillary: 111 mg/dL — ABNORMAL HIGH (ref 65–99)
Glucose-Capillary: 95 mg/dL (ref 65–99)
Glucose-Capillary: 217 mg/dL — ABNORMAL HIGH (ref 65–99)
Glucose-Capillary: 116 mg/dL — ABNORMAL HIGH (ref 65–99)

## 2017-01-12 LAB — CBC
HCT: 32.7 % — ABNORMAL LOW (ref 39.0–52.0)
Hemoglobin: 10.5 g/dL — ABNORMAL LOW (ref 13.0–17.0)
MCH: 27.5 pg (ref 26.0–34.0)
MCHC: 32.1 g/dL (ref 30.0–36.0)
MCV: 85.6 fL (ref 78.0–100.0)
Platelets: 246 10*3/uL (ref 150–400)
RBC: 3.82 MIL/uL — ABNORMAL LOW (ref 4.22–5.81)
RDW: 13.6 % (ref 11.5–15.5)
WBC: 7.4 10*3/uL (ref 4.0–10.5)

## 2017-01-12 MED ORDER — SODIUM CHLORIDE 0.9 % IV SOLN
INTRAVENOUS | Status: AC
  Administered 2017-01-12 – 2017-01-13 (×3): via INTRAVENOUS

## 2017-01-12 MED ORDER — AMLODIPINE BESYLATE 10 MG PO TABS
10.0000 mg | ORAL_TABLET | Freq: Every day | ORAL | Status: DC
  Administered 2017-01-12 – 2017-01-16 (×5): 10 mg via ORAL
  Filled 2017-01-12 (×5): qty 1

## 2017-01-12 NOTE — Progress Notes (Signed)
PROGRESS NOTE   Keith [name]  DEY:[id number]    DOB: May 28, 1965    DOA: 01/06/2017  PCP: Scot Jun, FNP   I have briefly reviewed patients previous medical records in [facility].  Brief Narrative:  74. 51-year-old male with PMH of DM 2, HTN, HIV/AIDS (Dr. [name], ID), chronic pain, chronic anemia, presented to ED with worsening swelling and increase drainage from chronic right foot wound. Admitted for diabetic foot infection.Hospital course complicated by acute kidney injury due to vancomycin toxicity.   Assessment & Plan:   Principal Problem:   Diabetic foot ulcer associated with diabetes mellitus due to underlying condition ([place]) Active Problems:   Essential hypertension   Insulin-requiring or dependent type II diabetes mellitus ([place])   HIV disease ([place])   Osteomyelitis ([place])   1. Infected right diabetic foot ulcer/wound infection: MRI confirms soft tissue infection but no osteomyelitis or drainable abscess. Wound care as per [facility] RN recommendations. Lower extremity venous Dopplers negative for DVT. Concern for some fluctuance around the wound on 10/4 and hence requested orthopedics/[name] to consult in house.Dr. Jess Barters input appreciated and recommends nonweightbearing, Darco shoe, walker or crutches to unload pressure from his foot, topical wound/foot care, oral doxycycline and follow-up in his office in one week. Completed 5 days of Zosyn and 4 days of vancomycin. IV antibiotics discontinued. Oral doxycycline.Stable. 2. Acute kidney injury/vancomycin toxicity: Vancomycin level 41 on 10/4, down to 30. Vancomycin discontinued. Creatinine went up from 1.02 to 3.59. Aggressive IV fluids, strict intake output and follow BMP. Creatinine down to 3.45. Nonoliguric. Follow BMP. Expect recovery. 3. Type II DM with peripheral neuropathy: Due to worsening renal function and CBGs in the low normal range or occasional he low, significantly reduced home dose of Lantus from 70 units  at bedtime to 20 units. SSI. Monitor closely.  4. HIV/AIDS: Outpatient follow-up with ID. Last CD4 count 210. Continue ART. 5. Anemia of chronic kidney disease: Stable.  6. Chronic pain: Continue pain management. 7. Essential hypertension: Uncontrolled. Not on medications at home. Started amlodipine and rapidly increased to 10 MG daily. When necessary IV hydralazine. 8. Hypokalemia: Replaced   DVT prophylaxis: Lovenox Code Status: Full Family Communication: None at bedside Disposition: DC home pending medical improvement, possibly in 2-3 days.   Consultants:  Orthopedics  Procedures:  None  Antimicrobials:  IV vancomycin and Zosyn -discontinued. Oral doxycycline.   Subjective: Reports some pain in his right foot but denies any other complaints. As per RN, no acute issues reported.  ROS: Denies dyspnea, chest pain, cough, palpitations, dizziness or lightheadedness.  Objective:  Vitals:   01/11/17 0554 01/11/17 1653 01/11/17 1900 01/12/17 0500  BP: (!) 141/91 (!) 175/101 (!) 155/110 (!) 150/88  Pulse: 82 91 92 75  Resp: 18  18 18   Temp:  98.6 F (37 C) 98.8 F (37.1 C) 98.4 F (36.9 C)  TempSrc:  Oral Oral Oral  SpO2: 100% 100% 100% 99%  Weight:      Height:        Examination:  General exam: Pleasant middle-aged male, moderately built and nourished, lying comfortably supine in bed. Stable. Respiratory system: Clear to auscultation. No increased work of breathing. Cardiovascular system: S1 and S2 heard, RRR. No JVD, murmur or pedal edema. Gastrointestinal system: Abdomen is nondistended, soft and nontender. Normal bowel sounds heard. Central nervous system: Alert and oriented. No focal neurological deficits. Stable without change. Extremities: Symmetric 5 x 5 power. Stable without change Skin: Right foot dressing clean and  dry. Psychiatry: Judgement and insight appear normal. Mood & affect appropriate. Stable.      Data Reviewed: I have personally reviewed  following labs and imaging studies  CBC:  Recent Labs Lab 01/06/17 1922 01/07/17 0438 01/08/17 0523 01/12/17 0451  WBC 7.3 5.3 7.6 7.4  NEUTROABS 4.5  --  5.4  --   HGB 11.6* 11.2* 10.9* 10.5*  HCT 34.8* 33.4* 33.2* 32.7*  MCV 85.1 84.1 84.9 85.6  PLT 251 237 237 785   Basic Metabolic Panel:  Recent Labs Lab 01/07/17 0438 01/07/17 1002 01/08/17 0523 01/10/17 0601 01/11/17 0440 01/12/17 0451  NA 136 137  --  135 139 140  K 4.1 3.9  --  3.4* 3.8 4.2  CL 102 100*  --  101 103 104  CO2 27 29  --  26 26 28   GLUCOSE 257* 141*  --  68 77 164*  BUN 13 10  --  15 16 17   CREATININE 1.04 1.02  --  3.59* 3.55* 3.45*  CALCIUM 9.1 9.2  --  8.9 9.2 9.1  MG  --   --  2.0  --   --   --    Liver Function Tests:  Recent Labs Lab 01/06/17 1922 01/07/17 1002  AST 16 17  ALT 12* 13*  ALKPHOS 74 65  BILITOT 0.7 0.7  PROT 8.0 7.4  ALBUMIN 3.8 3.3*   CBG:  Recent Labs Lab 01/11/17 1213 01/11/17 1651 01/11/17 2018 01/12/17 0656 01/12/17 1140  GLUCAP 67 169* 126* 111* 95    Recent Results (from the past 240 hour(s))  MRSA PCR Screening     Status: Abnormal   Collection Time: 01/10/17 11:18 AM  Result Value Ref Range Status   MRSA by PCR POSITIVE (A) NEGATIVE Final    Comment:        The GeneXpert MRSA Assay (FDA approved for NASAL specimens only), is one component of a comprehensive MRSA colonization surveillance program. It is not intended to diagnose MRSA infection nor to guide or monitor treatment for MRSA infections. RESULT CALLED TO, READ BACK BY AND VERIFIED WITH: Urbano Heir RN 13:30 01/10/17 ([email or web address])          Radiology Studies: No results found.      Scheduled Meds: . abacavir  600 mg Oral Daily   And  . dolutegravir  50 mg Oral Daily   And  . lamiVUDine  100 mg Oral Daily  . amLODipine  10 mg Oral Daily  . Chlorhexidine Gluconate Cloth  6 each Topical Q0600  . darunavir-cobicistat  1 tablet Oral Q breakfast  . doxycycline  100 mg Oral  Q12H  . enoxaparin (LOVENOX) injection  30 mg Subcutaneous Daily  . insulin aspart  0-9 Units Subcutaneous TID WC  . insulin glargine  20 Units Subcutaneous Q2200  . mupirocin ointment  1 application Nasal BID   Continuous Infusions: . sodium chloride 125 mL/hr at 01/12/17 0931     LOS: 5 days     Keith Tyrell, MD, FACP, FHM. [facility] Pager [phone]  If 7PM-7AM, please contact night-coverage [email or web address] Password [email or web address] 01/12/2017, 1:21 PM

## 2017-01-12 NOTE — Progress Notes (Signed)
Attempted to do CHG bath on pt, but pt refused, saying he would do it later; rolled onto left side and went back to sleep. Questioned again, and pt repeated himself while covering himself more with a blanket. Intervention re-timed for 0900 so that pt will still receive CHG.

## 2017-01-13 LAB — BASIC METABOLIC PANEL
Anion gap: 9 (ref 5–15)
BUN: 18 mg/dL (ref 6–20)
CO2: 24 mmol/L (ref 22–32)
Calcium: 9.2 mg/dL (ref 8.9–10.3)
Chloride: 105 mmol/L (ref 101–111)
Creatinine, Ser: 3.22 mg/dL — ABNORMAL HIGH (ref 0.61–1.24)
GFR calc Af Amer: 24 mL/min — ABNORMAL LOW (ref >60)
GFR calc non Af Amer: 21 mL/min — ABNORMAL LOW (ref >60)
Glucose, Bld: 91 mg/dL (ref 65–99)
Potassium: 3.8 mmol/L (ref 3.5–5.1)
Sodium: 138 mmol/L (ref 135–145)

## 2017-01-13 LAB — GLUCOSE, CAPILLARY
Glucose-Capillary: 90 mg/dL (ref 65–99)
Glucose-Capillary: 161 mg/dL — ABNORMAL HIGH (ref 65–99)
Glucose-Capillary: 152 mg/dL — ABNORMAL HIGH (ref 65–99)
Glucose-Capillary: 213 mg/dL — ABNORMAL HIGH (ref 65–99)

## 2017-01-13 MED ORDER — SODIUM CHLORIDE 0.9 % IV SOLN
INTRAVENOUS | Status: AC
  Administered 2017-01-13 – 2017-01-14 (×2): via INTRAVENOUS

## 2017-01-13 MED ORDER — ENOXAPARIN SODIUM 60 MG/0.6ML ~~LOC~~ SOLN
60.0000 mg | Freq: Every day | SUBCUTANEOUS | Status: DC
  Administered 2017-01-14 – 2017-01-16 (×3): 60 mg via SUBCUTANEOUS
  Filled 2017-01-13 (×3): qty 0.6

## 2017-01-13 NOTE — Progress Notes (Signed)
Physical Therapy Treatment Patient Details Name: Keith [name] MRN: [id number] DOB: 1965/11/05 Today's Date: 01/13/2017    History of Present Illness Pt is a 51 y.o. male admitted on 01/06/17 with worsening swelling and increase drainage from chronic right foot wound; admitted for diabetic foot infection of plantar aspect first metatarsal head right foot. Pertinent PMH includes HTN, HIV, DM, AIDS, osteomyelitis.     PT Comments    Pt unable to maintain R LE NWB 100% of time. Pt states when he puts R LE down it is <25% WBing. Pt unable to complete stair negotiation without R LE PWB.  Acute PT to con't to follow.   Follow Up Recommendations  No PT follow up;Supervision for mobility/OOB     Equipment Recommendations       Recommendations for Other Services       Precautions / Restrictions Precautions Precautions: Fall Other Brace/Splint: post op shoe Restrictions Weight Bearing Restrictions: Yes RLE Weight Bearing: Non weight bearing    Mobility  Bed Mobility Overal bed mobility: Modified Independent                Transfers Overall transfer level: Needs assistance Equipment used: Rolling walker (2 wheeled) Transfers: Sit to/from Stand Sit to Stand: Supervision         General transfer comment: v/c's to push up from bed and to maintain R LE NWB  Ambulation/Gait Ambulation/Gait assistance: Min guard Ambulation Distance (Feet): 40 Feet Assistive device: Rolling walker (2 wheeled) Gait Pattern/deviations: Step-to pattern;Trunk flexed Gait velocity: dec Gait velocity interpretation: Below normal speed for age/gender General Gait Details: pt maintained R LE NWB majority of time but occasionally placed heel down on floor for balance   Stairs Stairs: Yes   Stair Management: One rail Left;Step to pattern;Sideways Number of Stairs: 4 General stair comments: pt with PWB <25% on R foot, pt unable to hop on L foot and clear step height even with hand  rail  Wheelchair Mobility    Modified Rankin (Stroke Patients Only)       Balance Overall balance assessment: Needs assistance Sitting-balance support: No upper extremity supported Sitting balance-Leahy Scale: Good Sitting balance - Comments: however unable to place boot on R foot   Standing balance support: Single extremity supported Standing balance-Leahy Scale: Fair Standing balance comment: pt stood at commode to urinate with R heel down in darco shoe, pt reports less than 25% of weight through foot                            Cognition Arousal/Alertness: Awake/alert Behavior During Therapy: WFL for tasks assessed/performed Overall Cognitive Status: Within Functional Limits for tasks assessed                                        Exercises      General Comments General comments (skin integrity, edema, etc.): pt educated on the improtance of staying of R foot. discussed the benefit of a w/c for longer distances however pt deferred      Pertinent Vitals/Pain Pain Assessment: 0-10 Pain Score: 8  Pain Location: R foot Pain Descriptors / Indicators: Aching;Discomfort Pain Intervention(s): Monitored during session    Home Living                      Prior Function  PT Goals (current goals can now be found in the care plan section) Progress towards PT goals: Progressing toward goals    Frequency    Min 3X/week      PT Plan Current plan remains appropriate    Co-evaluation              AM-PAC PT "6 Clicks" Daily Activity  Outcome Measure  Difficulty turning over in bed (including adjusting bedclothes, sheets and blankets)?: None Difficulty moving from lying on back to sitting on the side of the bed? : None Difficulty sitting down on and standing up from a chair with arms (e.g., wheelchair, bedside commode, etc,.)?: None Help needed moving to and from a bed to chair (including a wheelchair)?: A  Little Help needed walking in hospital room?: A Little Help needed climbing 3-5 steps with a railing? : A Little 6 Click Score: 21    End of Session Equipment Utilized During Treatment: Gait belt Activity Tolerance: Patient tolerated treatment well Patient left: in chair;with call bell/phone within reach Nurse Communication: Mobility status PT Visit Diagnosis: Other abnormalities of gait and mobility (R26.89);Pain Pain - Right/Left: Right Pain - part of body: Ankle and joints of foot     Time: 3358-2518 PT Time Calculation (min) (ACUTE ONLY): 27 min  Charges:  $Gait Training: 8-22 mins $Therapeutic Activity: 8-22 mins                    G Codes:       Kittie Plater, PT, DPT Pager #: [phone] Office #: [phone]    [place] 01/13/2017, 1:16 PM

## 2017-01-13 NOTE — Progress Notes (Signed)
PROGRESS NOTE   TASHI BAND  QAS:[id number]    DOB: [birth year]    DOA: 01/06/2017  PCP: Scot Jun, FNP   I have briefly reviewed patients previous medical records in [facility].  Brief Narrative:  35. 51-year-old male with PMH of DM 2, HTN, HIV/AIDS (Dr. [name], ID), chronic pain, chronic anemia, presented to ED with worsening swelling and increase drainage from chronic right foot wound. Admitted for diabetic foot infection.Hospital course complicated by acute kidney injury due to vancomycin toxicity.Improving.   Assessment & Plan:   Principal Problem:   Diabetic foot ulcer associated with diabetes mellitus due to underlying condition ([place]) Active Problems:   Essential hypertension   Insulin-requiring or dependent type II diabetes mellitus ([place])   HIV disease ([place])   Osteomyelitis ([place])   1. Infected right diabetic foot ulcer/wound infection: MRI confirms soft tissue infection but no osteomyelitis or drainable abscess. Wound care as per [facility] RN recommendations. Lower extremity venous Dopplers negative for DVT. Orthopedics/Dr. Jess Barters input appreciated and recommends nonweightbearing, Darco shoe, walker or crutches to unload pressure from his foot, topical wound/foot care, oral doxycycline and follow-up in his office in one week. Completed 5 days of Zosyn and 4 days of vancomycin. IV antibiotics discontinued. Oral doxycycline, day 4/10. Stable. 2. Acute kidney injury due to vancomycin toxicity: Vancomycin level 41 on 10/4, down to 30. Vancomycin discontinued. Creatinine went up from 1.02 to 3.59. Aggressive IV fluids, strict intake output and follow BMP. Nonoliguric. Creatinine slowly improving/3.22. Continue aggressive IV fluids. Trend BMP. 3. Type II DM with peripheral neuropathy: Due to worsening renal function and CBGs in the low normal range or occasionally low, significantly reduced home dose of Lantus from 70 units at bedtime to 20 units. SSI. Monitor closely.  Reasonable control. 4. HIV/AIDS: Outpatient follow-up with ID. Last CD4 count 210. Continue ART. 5. Anemia of chronic kidney disease: Stable.  6. Chronic pain: Continue pain management. 7. Essential hypertension: Uncontrolled. Not on medications at home. Started amlodipine and rapidly increased to 10 MG daily. When necessary IV hydralazine. Better controlled. 8. Hypokalemia: Replaced   DVT prophylaxis: Lovenox Code Status: Full Family Communication: None at bedside Disposition: DC home pending medical improvement and improved creatinine. Will need close outpatient follow-up with his PCP to monitor renal functions and with [name].   Consultants:  Orthopedics  Procedures:  None  Antimicrobials:  IV vancomycin and Zosyn -discontinued. Oral doxycycline.   Subjective: Denies pain in right foot. No dyspnea reported. Urinating well. Denies any other complaints.  ROS: Denies dyspnea, chest pain, cough, palpitations, dizziness or lightheadedness.  Objective:  Vitals:   01/12/17 1521 01/12/17 2107 01/12/17 2258 01/13/17 0629  BP: (!) 137/94 (!) 172/110 (!) 150/93 123/70  Pulse: 98 99 90 80  Resp: 18 16 16 16   Temp: 97.7 F (36.5 C) 98.7 F (37.1 C) 98 F (36.7 C) 97.7 F (36.5 C)  TempSrc: Oral Oral Oral Oral  SpO2: 100% 98% 98% 97%  Weight:      Height:        Examination:  General exam: Pleasant middle-aged male, moderately built and nourished, lying comfortably supine in bed. Stable. Respiratory system: Clear to auscultation. No increased work of breathing. Stable Cardiovascular system: S1 and S2 heard, RRR. No JVD, murmur or pedal edema. Stable Gastrointestinal system: Abdomen is nondistended, soft and nontender. Normal bowel sounds heard. Stable Central nervous system: Alert and oriented. No focal neurological deficits. Stable without change. Extremities: Symmetric 5 x 5 power. Stable without  change Skin: Right foot dressing clean and dry. Psychiatry: Judgement  and insight appear normal. Mood & affect appropriate. Stable.      Data Reviewed: I have personally reviewed following labs and imaging studies  CBC:  Recent Labs Lab 01/06/17 1922 01/07/17 0438 01/08/17 0523 01/12/17 0451  WBC 7.3 5.3 7.6 7.4  NEUTROABS 4.5  --  5.4  --   HGB 11.6* 11.2* 10.9* 10.5*  HCT 34.8* 33.4* 33.2* 32.7*  MCV 85.1 84.1 84.9 85.6  PLT 251 237 237 938   Basic Metabolic Panel:  Recent Labs Lab 01/07/17 1002 01/08/17 0523 01/10/17 0601 01/11/17 0440 01/12/17 0451 01/13/17 0449  NA 137  --  135 139 140 138  K 3.9  --  3.4* 3.8 4.2 3.8  CL 100*  --  101 103 104 105  CO2 29  --  26 26 28 24   GLUCOSE 141*  --  68 77 164* 91  BUN 10  --  15 16 17 18   CREATININE 1.02  --  3.59* 3.55* 3.45* 3.22*  CALCIUM 9.2  --  8.9 9.2 9.1 9.2  MG  --  2.0  --   --   --   --    Liver Function Tests:  Recent Labs Lab 01/06/17 1922 01/07/17 1002  AST 16 17  ALT 12* 13*  ALKPHOS 74 65  BILITOT 0.7 0.7  PROT 8.0 7.4  ALBUMIN 3.8 3.3*   CBG:  Recent Labs Lab 01/12/17 1140 01/12/17 1635 01/12/17 2111 01/13/17 0628 01/13/17 1108  GLUCAP 95 217* 116* 90 161*    Recent Results (from the past 240 hour(s))  MRSA PCR Screening     Status: Abnormal   Collection Time: 01/10/17 11:18 AM  Result Value Ref Range Status   MRSA by PCR POSITIVE (A) NEGATIVE Final    Comment:        The GeneXpert MRSA Assay (FDA approved for NASAL specimens only), is one component of a comprehensive MRSA colonization surveillance program. It is not intended to diagnose MRSA infection nor to guide or monitor treatment for MRSA infections. RESULT CALLED TO, READ BACK BY AND VERIFIED WITH: Urbano Heir RN 13:30 01/10/17 ([email or web address])          Radiology Studies: No results found.      Scheduled Meds: . abacavir  600 mg Oral Daily   And  . dolutegravir  50 mg Oral Daily   And  . lamiVUDine  100 mg Oral Daily  . amLODipine  10 mg Oral Daily  . Chlorhexidine  Gluconate Cloth  6 each Topical Q0600  . darunavir-cobicistat  1 tablet Oral Q breakfast  . doxycycline  100 mg Oral Q12H  . [START ON 01/14/2017] enoxaparin (LOVENOX) injection  60 mg Subcutaneous Daily  . insulin aspart  0-9 Units Subcutaneous TID WC  . insulin glargine  20 Units Subcutaneous Q2200  . mupirocin ointment  1 application Nasal BID   Continuous Infusions: . sodium chloride 125 mL/hr at 01/13/17 0815     LOS: 6 days     Katerine Morua, MD, FACP, FHM. [facility] Pager [phone]  If 7PM-7AM, please contact night-coverage [email or web address] Password [email or web address] 01/13/2017, 1:01 PM

## 2017-01-14 LAB — GLUCOSE, CAPILLARY
Glucose-Capillary: 116 mg/dL — ABNORMAL HIGH (ref 65–99)
Glucose-Capillary: 137 mg/dL — ABNORMAL HIGH (ref 65–99)
Glucose-Capillary: 140 mg/dL — ABNORMAL HIGH (ref 65–99)
Glucose-Capillary: 106 mg/dL — ABNORMAL HIGH (ref 65–99)

## 2017-01-14 LAB — BASIC METABOLIC PANEL
Anion gap: 10 (ref 5–15)
BUN: 19 mg/dL (ref 6–20)
CO2: 25 mmol/L (ref 22–32)
Calcium: 9.3 mg/dL (ref 8.9–10.3)
Chloride: 103 mmol/L (ref 101–111)
Creatinine, Ser: 3.17 mg/dL — ABNORMAL HIGH (ref 0.61–1.24)
GFR calc Af Amer: 24 mL/min — ABNORMAL LOW (ref >60)
GFR calc non Af Amer: 21 mL/min — ABNORMAL LOW (ref >60)
Glucose, Bld: 124 mg/dL — ABNORMAL HIGH (ref 65–99)
Potassium: 3.7 mmol/L (ref 3.5–5.1)
Sodium: 138 mmol/L (ref 135–145)

## 2017-01-14 MED ORDER — ABACAVIR SULFATE 300 MG PO TABS
600.0000 mg | ORAL_TABLET | Freq: Every day | ORAL | Status: DC
  Administered 2017-01-15 – 2017-01-16 (×2): 600 mg via ORAL
  Filled 2017-01-14 (×2): qty 2

## 2017-01-14 MED ORDER — LAMIVUDINE 10 MG/ML PO SOLN
150.0000 mg | Freq: Every day | ORAL | Status: DC
  Administered 2017-01-15 – 2017-01-16 (×2): 150 mg via ORAL
  Filled 2017-01-14 (×2): qty 15

## 2017-01-14 MED ORDER — SODIUM CHLORIDE 0.9 % IV SOLN: INTRAVENOUS | Status: DC

## 2017-01-14 MED ORDER — DOLUTEGRAVIR SODIUM 50 MG PO TABS
50.0000 mg | ORAL_TABLET | Freq: Every day | ORAL | Status: DC
  Administered 2017-01-15 – 2017-01-16 (×2): 50 mg via ORAL
  Filled 2017-01-14 (×2): qty 1

## 2017-01-14 NOTE — Progress Notes (Signed)
PROGRESS NOTE    Keith [name]  MHD:[id number] DOB: 06-May-1965 DOA: 01/06/2017 PCP: Scot Jun, FNP   Specialists:     Brief Narrative:   36 ? HIV/AIDS initial diagnosis 1992[CD4 210 in 05/2016] NRTImedications Q119E, R740C Complication of genital warts on imiquimod 5% IDDM on insulin Prior admission honk 05/2014 Chr Painpreviously managed by Dr. Kennon Holter now managed by primary care physician  Prior MRSA on admission 06/05/16, Rx Doxy/Keflex as OP Previous infection 03/2015 right second finger Neck abscess without formal I&D 04/2014 admission  Last discharged by me 6/24/2018with acute kidney injury secondary to nonsteroidals, ibuprofen  Represented 2 emergency room 01/07/17 with foot ulcer See below discussion   Assessment & Plan:   Principal Problem:   Diabetic foot ulcer associated with diabetes mellitus due to underlying condition ([place]) Active Problems:   Essential hypertension   Insulin-requiring or dependent type II diabetes mellitus ([place])   HIV disease ([place])   Osteomyelitis ([place])   Infected right diabetic foot ulcer  MRI = soft tissue infection--[name] input appreciated-nonweightbearing, Darco shoe unload foot and continue oral doxycycline-stop date 10/14, needs close outpatient follow-up [name]  Acute kidney injury secondary to vancomycin  Creatinine 1.0-->3.5,minus 4 L so far this admission  I'm not sure if he completely understands medical jargon-I explained his situation to his pastor on the phone today  I will follow up in the morning  For now as he is not fluid overloaded will continue saline 50 cc/H  HIV since 1994  Has not had viral loads since 05/2016 was 210--ordering with next labs  We will plug him back in with ID--->Obtaining the same today  Continue abacavir, tivicay, epivir etc. Etc.  Htn  Amlodipine 10 qd, Hydralazine 10 mg Q 6 prn  For HTn > 160  DM ty ii with nephropathy  Lantus 20 [was  on 70 U]--had hypoglycemic  SSI sensitive--CBG's are 137-140   DVT prophylaxis: lovenox Code Status: full  Family Communication: none Disposition Plan: inpatient   Consultants:   Sharol Given  Procedures:   nad  Antimicrobials:   Doxycyline 100 bid    Subjective:  Fair No fever no chills Some LE pains No cp 2 trays in the room and eating well Drinking well and passing good urine I diarrhea--no chills no rigors  Objective: Vitals:   01/13/17 0629 01/13/17 1300 01/13/17 2038 01/14/17 0432  BP: 123/70 130/78 (!) 148/96 (!) 147/84  Pulse: 80 82 87 85  Resp: 16 16 20 17   Temp: 97.7 F (36.5 C) 97.6 F (36.4 C)  97.8 F (36.6 C)  TempSrc: Oral Oral  Axillary  SpO2: 97% 95% 100% 100%  Weight:      Height:        Intake/Output Summary (Last 24 hours) at 01/14/17 1629 Last data filed at 01/14/17 1500  Gross per 24 hour  Intake             1380 ml  Output             3200 ml  Net            -1820 ml   Filed Weights   01/07/17 0208 01/09/17 0100  Weight: 117.9 kg (260 lb) 117.9 kg (260 lb)    Examination:  Body mass index is 38.4 kg/m. mallampatti 3 no bruit no jvf s1 s2 no m/r/g abd soft nt  No le edema cta b No rales no ronchi  Data Reviewed: I have personally reviewed following labs and imaging  studies  CBC:  Recent Labs Lab 01/08/17 0523 01/12/17 0451  WBC 7.6 7.4  NEUTROABS 5.4  --   HGB 10.9* 10.5*  HCT 33.2* 32.7*  MCV 84.9 85.6  PLT 237 403   Basic Metabolic Panel:  Recent Labs Lab 01/08/17 0523 01/10/17 0601 01/11/17 0440 01/12/17 0451 01/13/17 0449 01/14/17 0517  NA  --  135 139 140 138 138  K  --  3.4* 3.8 4.2 3.8 3.7  CL  --  101 103 104 105 103  CO2  --  26 26 28 24 25   GLUCOSE  --  68 77 164* 91 124*  BUN  --  15 16 17 18 19   CREATININE  --  3.59* 3.55* 3.45* 3.22* 3.17*  CALCIUM  --  8.9 9.2 9.1 9.2 9.3  MG 2.0  --   --   --   --   --    GFR: Estimated Creatinine Clearance: 34.5 mL/min (A) (by C-G formula based on  SCr of 3.17 mg/dL (H)). Liver Function Tests: No results for input(s): AST, ALT, ALKPHOS, BILITOT, PROT, ALBUMIN in the last 168 hours. No results for input(s): LIPASE, AMYLASE in the last 168 hours. No results for input(s): AMMONIA in the last 168 hours. Coagulation Profile: No results for input(s): INR, PROTIME in the last 168 hours. Cardiac Enzymes: No results for input(s): CKTOTAL, CKMB, CKMBINDEX, TROPONINI in the last 168 hours. BNP (last 3 results) No results for input(s): PROBNP in the last 8760 hours. HbA1C: No results for input(s): HGBA1C in the last 72 hours. CBG:  Recent Labs Lab 01/13/17 1631 01/13/17 2041 01/14/17 0615 01/14/17 1143 01/14/17 1611  GLUCAP 152* 213* 116* 137* 140*   Lipid Profile: No results for input(s): CHOL, HDL, LDLCALC, TRIG, CHOLHDL, LDLDIRECT in the last 72 hours. Thyroid Function Tests: No results for input(s): TSH, T4TOTAL, FREET4, T3FREE, THYROIDAB in the last 72 hours. Anemia Panel: No results for input(s): VITAMINB12, FOLATE, FERRITIN, TIBC, IRON, RETICCTPCT in the last 72 hours. Urine analysis:    Component Value Date/Time   COLORURINE YELLOW 11/18/2016 1429   APPEARANCEUR CLOUDY (A) 11/18/2016 1429   LABSPEC >=1.030 01/01/2017 1032   PHURINE 5.5 01/01/2017 1032   GLUCOSEU 250 (A) 01/01/2017 1032   HGBUR SMALL (A) 01/01/2017 1032   BILIRUBINUR NEGATIVE 01/01/2017 1032   KETONESUR NEGATIVE 01/01/2017 1032   PROTEINUR 100 (A) 01/01/2017 1032   UROBILINOGEN 1.0 01/01/2017 1032   NITRITE NEGATIVE 01/01/2017 1032   LEUKOCYTESUR NEGATIVE 01/01/2017 1032     Radiology Studies: Reviewed images personally in health database    Scheduled Meds: . abacavir  600 mg Oral Daily   And  . dolutegravir  50 mg Oral Daily   And  . lamiVUDine  100 mg Oral Daily  . amLODipine  10 mg Oral Daily  . Chlorhexidine Gluconate Cloth  6 each Topical Q0600  . darunavir-cobicistat  1 tablet Oral Q breakfast  . doxycycline  100 mg Oral Q12H  .  enoxaparin (LOVENOX) injection  60 mg Subcutaneous Daily  . insulin aspart  0-9 Units Subcutaneous TID WC  . insulin glargine  20 Units Subcutaneous Q2200  . mupirocin ointment  1 application Nasal BID   Continuous Infusions:   LOS: 7 days    Time spent: [place], MD [facility] ([facility]   If 7PM-7AM, please contact night-coverage [email or web address] Password [email or web address] 01/14/2017, 4:29 PM

## 2017-01-14 NOTE — Progress Notes (Signed)
Dressing change done this am.

## 2017-01-15 LAB — CBC
HCT: 32 % — ABNORMAL LOW (ref 39.0–52.0)
Hemoglobin: 10.6 g/dL — ABNORMAL LOW (ref 13.0–17.0)
MCH: 27.9 pg (ref 26.0–34.0)
MCHC: 33.1 g/dL (ref 30.0–36.0)
MCV: 84.2 fL (ref 78.0–100.0)
Platelets: 272 10*3/uL (ref 150–400)
RBC: 3.8 MIL/uL — ABNORMAL LOW (ref 4.22–5.81)
RDW: 13.2 % (ref 11.5–15.5)
WBC: 7.1 10*3/uL (ref 4.0–10.5)

## 2017-01-15 LAB — COMPREHENSIVE METABOLIC PANEL
ALT: 20 U/L (ref 17–63)
AST: 21 U/L (ref 15–41)
Albumin: 3 g/dL — ABNORMAL LOW (ref 3.5–5.0)
Alkaline Phosphatase: 63 U/L (ref 38–126)
Anion gap: 11 (ref 5–15)
BUN: 25 mg/dL — ABNORMAL HIGH (ref 6–20)
CO2: 24 mmol/L (ref 22–32)
Calcium: 9.3 mg/dL (ref 8.9–10.3)
Chloride: 103 mmol/L (ref 101–111)
Creatinine, Ser: 3.25 mg/dL — ABNORMAL HIGH (ref 0.61–1.24)
GFR calc Af Amer: 24 mL/min — ABNORMAL LOW (ref >60)
GFR calc non Af Amer: 20 mL/min — ABNORMAL LOW (ref >60)
Glucose, Bld: 189 mg/dL — ABNORMAL HIGH (ref 65–99)
Potassium: 3.8 mmol/L (ref 3.5–5.1)
Sodium: 138 mmol/L (ref 135–145)
Total Bilirubin: 0.3 mg/dL (ref 0.3–1.2)
Total Protein: 7.4 g/dL (ref 6.5–8.1)

## 2017-01-15 LAB — GLUCOSE, CAPILLARY
Glucose-Capillary: 192 mg/dL — ABNORMAL HIGH (ref 65–99)
Glucose-Capillary: 165 mg/dL — ABNORMAL HIGH (ref 65–99)
Glucose-Capillary: 228 mg/dL — ABNORMAL HIGH (ref 65–99)
Glucose-Capillary: 196 mg/dL — ABNORMAL HIGH (ref 65–99)

## 2017-01-15 LAB — HIV-1 RNA QUANT-NO REFLEX-BLD
HIV 1 RNA Quant: <20 copies/mL
LOG10 HIV-1 RNA: UNDETERMINED log10copy/mL

## 2017-01-15 NOTE — Progress Notes (Signed)
Physical Therapy Treatment Patient Details Name: Keith [name] MRN: [id number] DOB: [birth year] Today's Date: 01/15/2017    History of Present Illness Pt is a 51 y.o. male admitted on 01/06/17 with worsening swelling and increase drainage from chronic right foot wound; admitted for diabetic foot infection of plantar aspect first metatarsal head right foot. Pertinent PMH includes HTN, HIV, DM, AIDS, osteomyelitis.     PT Comments    Pt is limited by fatigue and pain this PM.  He says he has been up most of the day.  He has difficulty consistently maintaining NWB right foot due to poor UE strength and endurance.  He can do NWB for ~3 steps and then returns to [facility], and then with cues after a few steps NWB again.     Follow Up Recommendations  No PT follow up;Supervision for mobility/OOB     Equipment Recommendations  Rolling walker with 5" wheels    Recommendations for Other Services   NA     Precautions / Restrictions Precautions Precautions: Fall Required Braces or Orthoses: Other Brace/Splint Other Brace/Splint: DARCO shoe Restrictions RLE Weight Bearing: Non weight bearing    Mobility  Bed Mobility               General bed mobility comments: Pt was OOB in the chair  Transfers Overall transfer level: Needs assistance Equipment used: Rolling walker (2 wheeled) Transfers: Sit to/from Stand Sit to Stand: Supervision         General transfer comment: supervision for safety, max verbal cues for NWB right foot  Ambulation/Gait Ambulation/Gait assistance: Min guard Ambulation Distance (Feet): 15 Feet Assistive device: Rolling walker (2 wheeled) Gait Pattern/deviations: Step-to pattern;Trunk flexed Gait velocity: dec Gait velocity interpretation: Below normal speed for age/gender General Gait Details: Pt needed max cues to maintain NWB right foot, most of the time he would switch to partial WB and then take a few hops and then switch to [facility] again.            Balance Overall balance assessment: Needs assistance Sitting-balance support: Feet supported;No upper extremity supported Sitting balance-Leahy Scale: Good     Standing balance support: Bilateral upper extremity supported Standing balance-Leahy Scale: Fair                              Cognition Arousal/Alertness: Awake/alert Behavior During Therapy: WFL for tasks assessed/performed Overall Cognitive Status: Within Functional Limits for tasks assessed                                        Exercises General Exercises - Upper Extremity Shoulder Flexion: AROM;Both;10 reps Chair Push Up: Other (comment) (educated on doing these while up, but did not preform) General Exercises - Lower Extremity Ankle Circles/Pumps: AROM;Both;20 reps Long Arc Quad: AROM;Both;10 reps Hip Flexion/Marching: AROM;Both;10 reps        Pertinent Vitals/Pain Pain Assessment: No/denies pain           PT Goals (current goals can now be found in the care plan section) Acute Rehab PT Goals Patient Stated Goal: to heal his foot Progress towards PT goals: Progressing toward goals    Frequency    Min 3X/week      PT Plan Current plan remains appropriate       AM-PAC PT "6 Clicks" Daily Activity  Outcome Measure  Difficulty turning  over in bed (including adjusting bedclothes, sheets and blankets)?: None Difficulty moving from lying on back to sitting on the side of the bed? : None Difficulty sitting down on and standing up from a chair with arms (e.g., wheelchair, bedside commode, etc,.)?: None Help needed moving to and from a bed to chair (including a wheelchair)?: A Little Help needed walking in hospital room?: A Little Help needed climbing 3-5 steps with a railing? : A Little 6 Click Score: 21    End of Session   Activity Tolerance: Patient limited by pain Patient left: in chair;with call bell/phone within reach   PT Visit Diagnosis: Other  abnormalities of gait and mobility (R26.89);Pain Pain - Right/Left: Right Pain - part of body: Ankle and joints of foot     Time: 1725-1737 PT Time Calculation (min) (ACUTE ONLY): 12 min  Charges:  $Gait Training: 8-22 mins      Colletta Spillers B. Morty Ortwein, PT, DPT [phone]          01/15/2017, 5:56 PM

## 2017-01-15 NOTE — Progress Notes (Signed)
PROGRESS NOTE    Keith [name]  TML:[id number] DOB: 01/22/1966 DOA: 01/06/2017 PCP: Scot Jun, FNP   Specialists:     Brief Narrative:   57 ? HIV/AIDS initial diagnosis 1992[CD4 210 in 05/2016] NRTImedications K812X, N170Y Complication of genital warts on imiquimod 5% IDDM on insulin Prior admission honk 05/2014 Chr Painpreviously managed by Dr. Kennon Holter now managed by primary care physician  Prior MRSA on admission 06/05/16, Rx Doxy/Keflex as OP Previous infection 03/2015 right second finger Neck abscess without formal I&D 04/2014 admission  Last discharged by me 09/29/2016 with acute kidney injury secondary to nonsteroidals, ibuprofen  Represented 2 emergency room 01/07/17 with foot ulcer See below discussion   Assessment & Plan:   Principal Problem:   Diabetic foot ulcer associated with diabetes mellitus due to underlying condition ([place]) Active Problems:   Essential hypertension   Insulin-requiring or dependent type II diabetes mellitus ([place])   HIV disease ([place])   Osteomyelitis ([place])   Infected right diabetic foot ulcer  MRI = soft tissue infection--[name] input appreciated-nonweightbearing,   Darco shoe unload foot- continue oral doxycycline-stop date 10/14  needs close outpatient follow-up [name]  Acute kidney injury secondary to vancomycin  Creatinine 1.0-->3.5,minus 5.3L so far this admission  Patient worried about if he will need dialysis  Discussed with nephrologist-recommending holding fluids, Lasix and letting equilibrate  HIV since 1994  HIV RNa is <20 10/10  We will plug him back in with ID on d/c  Continue abacavir, tivicay, epivir etc. Etc.  Htn  Amlodipine 10 qd, Hydralazine 10 mg Q 6 prn  For HTn > 160  DM ty ii with nephropathy  Lantus 20 [was on 70 U]--had hypoglycemia earlier in admission  SSI sensitive--CBG's are 137-140   DVT prophylaxis: lovenox Code Status: full  Family  Communication: none Disposition Plan: inpatient   Consultants:   Sharol Given  Telephone consulted Dr. Justin Mend 10/10  Procedures:   nad  Antimicrobials:   Doxycyline 100 bid    Subjective:  Fair wants to go home No new issues overall very concerned about his kidneys   Objective: Vitals:   01/13/17 2038 01/14/17 0432 01/14/17 2031 01/15/17 0557  BP: (!) 148/96 (!) 147/84 (!) 144/92 (!) 151/88  Pulse: 87 85 92 90  Resp: 20 17 19    Temp:  97.8 F (36.6 C) 98.3 F (36.8 C) 98.5 F (36.9 C)  TempSrc:  Axillary Axillary Axillary  SpO2: 100% 100% 100% 100%  Weight:      Height:        Intake/Output Summary (Last 24 hours) at 01/15/17 1425 Last data filed at 01/15/17 0900  Gross per 24 hour  Intake              480 ml  Output             2150 ml  Net            -1670 ml   Filed Weights   01/07/17 0208 01/09/17 0100  Weight: 117.9 kg (260 lb) 117.9 kg (260 lb)    Examination:  Body mass index is 38.4 kg/m. mallampatti 3 no bruit no jvf--neck soft supple s1 s2 no m/r/g abd soft nt  No le edema cta no ronchi Feet not examined today   Data Reviewed: I have personally reviewed following labs and imaging studies  CBC:  Recent Labs Lab 01/12/17 0451 01/15/17 0511  WBC 7.4 7.1  HGB 10.5* 10.6*  HCT 32.7* 32.0*  MCV 85.6 84.2  PLT 246 638   Basic Metabolic Panel:  Recent Labs Lab 01/11/17 0440 01/12/17 0451 01/13/17 0449 01/14/17 0517 01/15/17 0511  NA 139 140 138 138 138  K 3.8 4.2 3.8 3.7 3.8  CL 103 104 105 103 103  CO2 26 28 24 25 24   GLUCOSE 77 164* 91 124* 189*  BUN 16 17 18 19  25*  CREATININE 3.55* 3.45* 3.22* 3.17* 3.25*  CALCIUM 9.2 9.1 9.2 9.3 9.3   GFR: Estimated Creatinine Clearance: 33.7 mL/min (A) (by C-G formula based on SCr of 3.25 mg/dL (H)). Liver Function Tests:  Recent Labs Lab 01/15/17 0511  AST 21  ALT 20  ALKPHOS 63  BILITOT 0.3  PROT 7.4  ALBUMIN 3.0*   No results for input(s): LIPASE, AMYLASE in the last 168  hours. No results for input(s): AMMONIA in the last 168 hours. Coagulation Profile: No results for input(s): INR, PROTIME in the last 168 hours. Cardiac Enzymes: No results for input(s): CKTOTAL, CKMB, CKMBINDEX, TROPONINI in the last 168 hours. BNP (last 3 results) No results for input(s): PROBNP in the last 8760 hours. HbA1C: No results for input(s): HGBA1C in the last 72 hours. CBG:  Recent Labs Lab 01/14/17 1143 01/14/17 1611 01/14/17 2142 01/15/17 0753 01/15/17 1132  GLUCAP 137* 140* 106* 192* 165*   Lipid Profile: No results for input(s): CHOL, HDL, LDLCALC, TRIG, CHOLHDL, LDLDIRECT in the last 72 hours. Thyroid Function Tests: No results for input(s): TSH, T4TOTAL, FREET4, T3FREE, THYROIDAB in the last 72 hours. Anemia Panel: No results for input(s): VITAMINB12, FOLATE, FERRITIN, TIBC, IRON, RETICCTPCT in the last 72 hours. Urine analysis:    Component Value Date/Time   COLORURINE YELLOW 11/18/2016 1429   APPEARANCEUR CLOUDY (A) 11/18/2016 1429   LABSPEC >=1.030 01/01/2017 1032   PHURINE 5.5 01/01/2017 1032   GLUCOSEU 250 (A) 01/01/2017 1032   HGBUR SMALL (A) 01/01/2017 1032   BILIRUBINUR NEGATIVE 01/01/2017 1032   KETONESUR NEGATIVE 01/01/2017 1032   PROTEINUR 100 (A) 01/01/2017 1032   UROBILINOGEN 1.0 01/01/2017 1032   NITRITE NEGATIVE 01/01/2017 1032   LEUKOCYTESUR NEGATIVE 01/01/2017 1032     Radiology Studies: Reviewed images personally in health database    Scheduled Meds: . lamiVUDine  150 mg Oral Daily   And  . abacavir  600 mg Oral Daily   And  . dolutegravir  50 mg Oral Daily  . amLODipine  10 mg Oral Daily  . Chlorhexidine Gluconate Cloth  6 each Topical Q0600  . darunavir-cobicistat  1 tablet Oral Q breakfast  . doxycycline  100 mg Oral Q12H  . enoxaparin (LOVENOX) injection  60 mg Subcutaneous Daily  . insulin aspart  0-9 Units Subcutaneous TID WC  . insulin glargine  20 Units Subcutaneous Q2200  . mupirocin ointment  1 application  Nasal BID   Continuous Infusions: . sodium chloride       LOS: 8 days    Time spent: Moore, MD [facility] ([facility]   If 7PM-7AM, please contact night-coverage [email or web address] Password [email or web address] 01/15/2017, 2:25 PM

## 2017-01-15 NOTE — Progress Notes (Signed)
Asked by Dr. Verlon Au to review medication list and adjust any medications for renal impairment.  Pharmacy has been following patient peripherally- His Triumeq was split into individual components d/t renal impairment (abacavir 600mg  daily, dolutegravir 50mg  daily, lamivudine 150mg  daily). Lamivudine was dose adjusted to 100mg  daily initially- yesterday dose was increased as CrCl was >68mL/min- still appropriate. Continuing Prezcobix 800/150mg  qbreakfast which does not require dose adjustment.  While Triumeq can cause renal insufficiency in <2% of patients taking it, this is unlikely to be the cause or contributing factor to his AKI as he has been on this medication.  No other medications on his current or PTA medication list have significant AKI risk (diclofenac was PRN topical, metformin does not cause AKI).  Likely patient's AKI was caused by combination of vancomycin (with supratherapeutic troughs) and Zosyn- growing body of evidence supports this combination is much higher risk for developing AKI than any other beta-lactam + vancomycin.  Plan: -all medications dosed appropriately at this time -pharmacy will continue to follow peripherally and adjust medications as needed   Cela Newcom D. Roisin Mones, PharmD, BCPS Clinical Pharmacist Pager: [phone] Clinical Phone for 01/15/2017 until 3:30pm: [phone] If after 3:30pm, please call main pharmacy at [phone] 01/15/2017 9:51 AM

## 2017-01-16 ENCOUNTER — Other Ambulatory Visit: Payer: Self-pay | Admitting: Infectious Diseases

## 2017-01-16 LAB — GLUCOSE, CAPILLARY
Glucose-Capillary: 124 mg/dL — ABNORMAL HIGH (ref 65–99)
Glucose-Capillary: 112 mg/dL — ABNORMAL HIGH (ref 65–99)

## 2017-01-16 LAB — BASIC METABOLIC PANEL
Anion gap: 9 (ref 5–15)
BUN: 26 mg/dL — ABNORMAL HIGH (ref 6–20)
CO2: 26 mmol/L (ref 22–32)
Calcium: 9.3 mg/dL (ref 8.9–10.3)
Chloride: 103 mmol/L (ref 101–111)
Creatinine, Ser: 3.2 mg/dL — ABNORMAL HIGH (ref 0.61–1.24)
GFR calc Af Amer: 24 mL/min — ABNORMAL LOW (ref >60)
GFR calc non Af Amer: 21 mL/min — ABNORMAL LOW (ref >60)
Glucose, Bld: 135 mg/dL — ABNORMAL HIGH (ref 65–99)
Potassium: 3.9 mmol/L (ref 3.5–5.1)
Sodium: 138 mmol/L (ref 135–145)

## 2017-01-16 MED ORDER — DOXYCYCLINE HYCLATE 100 MG PO TABS: 100.0000 mg | ORAL_TABLET | Freq: Two times a day (BID) | ORAL | 0 refills | Status: DC

## 2017-01-16 MED ORDER — INSULIN GLARGINE 100 UNIT/ML SOLOSTAR PEN: 20.0000 [IU] | PEN_INJECTOR | Freq: Every day | SUBCUTANEOUS | 3 refills | Status: DC

## 2017-01-16 MED ORDER — AMLODIPINE BESYLATE 10 MG PO TABS: 10.0000 mg | ORAL_TABLET | Freq: Every day | ORAL | 0 refills | Status: DC

## 2017-01-16 MED ORDER — GLIMEPIRIDE 1 MG PO TABS: 1.0000 mg | ORAL_TABLET | ORAL | 0 refills | Status: DC

## 2017-01-16 NOTE — Progress Notes (Signed)
RN informed pt to follow up with Ms. Harris following discharge. Pt verbalizes understanding.

## 2017-01-16 NOTE — Discharge Summary (Addendum)
Physician Discharge Summary  HAYDIN CALANDRA XLK:[id number] DOB: [birth year] DOA: 01/06/2017  PCP: Scot Jun, FNP  Admit date: 01/06/2017 Discharge date: 01/16/2017  Time spent: 35 minutes  Recommendations for Outpatient Follow-up:  1. bmet as per [facility] RN--report to NP awho can advise accordingly on follow up at office 2. Needs follow-up with Dr. Caryl Ada doxy 10/14--darco boot at all times ambulating 3. D/c metformin-->amaryl.  lantus changefrom 70--->20 this admit.  Amlodipine added this admit  Discharge Diagnoses:  Principal Problem:   Diabetic foot ulcer associated with diabetes mellitus due to underlying condition [facility]) Active Problems:   Essential hypertension   Insulin-requiring or dependent type II diabetes mellitus ([place])   HIV disease ([place])   Osteomyelitis ([place])   Discharge Condition: fair  Diet recommendation: diabetic  Filed Weights   01/07/17 0208 01/09/17 0100  Weight: 117.9 kg (260 lb) 117.9 kg (260 lb)    History of present illness:  50 ? HIV/AIDS initial diagnosis 1992[CD4 210 in 05/2016] NRTImedications D664Q, I347Q Complication of genital warts on imiquimod 5% IDDM on insulin Prior admission honk 05/2014 Chr Painpreviously managed by Dr. Tylene Fantasia managed by primary care physician  Prior MRSA on admission 06/05/16, Rx Doxy/Keflex as OP Previous infection 03/2015 right second finger Neck abscess without formal I&D 04/2014 admission  Last discharged by me 09/29/2016 with acute kidney injury secondary to nonsteroidals, ibuprofen  Represented 2 emergency room 01/07/17 with foot ulcer See below discussion  Hospital Course:   Infected right diabetic foot ulcer             MRI = soft tissue infection--[name] input appreciated-nonweightbearing,              Darco shoe unload foot- continue oral doxycycline-stop date 10/14             needs close outpatient follow-up Dr. Alferd Patee him on his  note  Aquacel dressing as per [place] Nurse  Acute kidney injury secondary to vancomycin             Creatinine 1.0-->3.5,minus 7.7L so far this admission             Patient worried about if he will need dialysis             Discussed with nephrologist dr. Maye Hides holding fluids, Lasix --d/w patient and decision made for close OP follow up with labs and discussed need to return to ED if acidotic or oliguria--I also discussed this with his pastor who understood and we will get [place] to draw labs within3-5 days  HIV since 1994             HIV RNa is <20 01/15/17             We will plug him back in with ID on d/c             Continue abacavir, tivicay, epivir etc. Etc.  Htn             Amlodipine 10 qd, Hydralazine 10 mg Q 6 prn  For HTn > 160  DM ty ii with nephropathy             Lantus 20 [was on 70 U]--had hypoglycemia earlier in admission             SSI sensitive--CBG's are 137-140  Stopped metformin this admit--changed to amaryl 1 mg daily   Consultations:  [name]  Dr. Justin Mend  Discharge Exam: Vitals:   01/16/17 0956 01/16/17 1419  BP:  127/72 129/81  Pulse: 81 88  Resp:  16  Temp:  98.7 F (37.1 C)  SpO2:  100%    General: eomi ncat asking about home Cardiovascular:  s1 s 2 no m Respiratory: clear no added sound abd soft No le edema   Discharge Instructions   Discharge Instructions    Diet - low sodium heart healthy    Complete by:  As directed    Discharge instructions    Complete by:  As directed    You need labs in 3-4 days reported to your NP--If your creatinine is worse, you will need to come back to the ED Stop metformin--starting AMaryl--take with food Note your lantus dose changes follo with Dr. duda--I will copy him on this note--wear your boot at all times when walking   For home use only DME Crutches    Complete by:  As directed    Increase activity slowly    Complete by:  As directed    Non weight bearing    Complete by:  As  directed    Laterality:  right   Extremity:  Lower   Post-op shoe    Complete by:  As directed    Darco shoe     Current Discharge Medication List    START taking these medications   Details  amLODipine (NORVASC) 10 MG tablet Take 1 tablet (10 mg total) by mouth daily. Qty: 30 tablet, Refills: 0    !! doxycycline (VIBRA-TABS) 100 MG tablet Take 1 tablet (100 mg total) by mouth 2 (two) times daily. Qty: 60 tablet, Refills: 0    !! doxycycline (VIBRA-TABS) 100 MG tablet Take 1 tablet (100 mg total) by mouth every 12 (twelve) hours. Qty: 7 tablet, Refills: 0    glimepiride (AMARYL) 1 MG tablet Take 1 tablet (1 mg total) by mouth every morning. Qty: 30 tablet, Refills: 0     !! - Potential duplicate medications found. Please discuss with provider.    CONTINUE these medications which have CHANGED   Details  Insulin Glargine (LANTUS SOLOSTAR) 100 UNIT/ML Solostar Pen Inject 20 Units into the skin daily at 10 pm. Qty: 15 mL, Refills: 3   Associated Diagnoses: Type 2 diabetes with circulatory disorder causing erectile dysfunction (HCC)      CONTINUE these medications which have NOT CHANGED   Details  abacavir-dolutegravir-lamiVUDine (TRIUMEQ) 600-50-300 MG tablet TAKE 1 TABLET BY MOUTH DAILY, STOP GENVOYA AND PREZISTA Qty: 30 tablet, Refills: 3    cyclobenzaprine (FLEXERIL) 10 MG tablet Take 1 tablet (10 mg total) by mouth 2 (two) times daily as needed for muscle spasms. Qty: 20 tablet, Refills: 0    darunavir-cobicistat (PREZCOBIX) 800-150 MG tablet Take 1 tablet by mouth daily. with food Qty: 30 tablet, Refills: 3    Insulin Syringes, Disposable, U-100 0.3 ML MISC For administration of insulin TID. ICD E-11.9 Qty: 300 each, Refills: 1    LORazepam (ATIVAN) 1 MG tablet Take 1 tablet (1 mg total) by mouth every 6 (six) hours as needed for anxiety. Qty: 30 tablet, Refills: 3    naloxone (NARCAN) nasal spray 4 mg/0.1 mL Place 1 spray into the nose daily as needed (opioid  overdose).    Oxycodone HCl 10 MG TABS Take 1 tablet (10 mg total) by mouth every 8 (eight) hours as needed (moderate or severe pain). Qty: 6 tablet, Refills: 0      STOP taking these medications     diclofenac sodium (VOLTAREN) 1 % GEL  metFORMIN (GLUCOPHAGE) 500 MG tablet      cephALEXin (KEFLEX) 500 MG capsule      doxycycline (VIBRAMYCIN) 100 MG capsule        Allergies  Allergen Reactions  . Sulfa Antibiotics Itching   Follow-up Information    [name], MD Follow up in 1 week(s).   Specialty:  Orthopedic Surgery Contact information: [place] [address] [phone]            The results of significant diagnostics from this hospitalization (including imaging, microbiology, ancillary and laboratory) are listed below for reference.    Significant Diagnostic Studies: Mr Foot Right Wo Contrast  Result Date: 01/07/2017 CLINICAL DATA:  Worsening right foot ulcer. Diabetes. Swollen right foot. EXAM: MRI OF THE RIGHT FOREFOOT WITHOUT CONTRAST TECHNIQUE: Multiplanar, multisequence MR imaging of the right forefoot was performed. No intravenous contrast was administered. COMPARISON:  Radiographs from 01/06/2017 FINDINGS: Bones/Joint/Cartilage No significant bony destructive findings or marrow edema to suggest osteomyelitis. Mild erosive arthropathy along the midfoot, Lisfranc joint, and proximal intermetatarsal articulations. First digit sesamoids appear unremarkable. Mild degenerative arthropathy of the first MTP joint. Ligaments Lisfranc ligament intact. Muscles and Tendons Low-level diffuse edema in the plantar musculature of the foot. Soft tissues Cutaneous ulceration plantar to the first MTP joint. There is potentially a miniscule fluid collection draining to the ulceration on image 13/6 but no drainable abscess. Localized cellulitis tracking into the great toe. Dorsal subcutaneous edema along the forefoot is nonspecific but could also be  due to cellulitis. IMPRESSION: 1. Cutaneous ulceration plantar to the first MTP joint with surrounding cellulitis tracking into the great toe. Possible cellulitis along the dorsum of the foot. 2. Low-level diffuse edema in the plantar musculature of the foot. 3. No osteomyelitis or drainable abscess. 4. Erosive arthropathy in the midfoot, Lisfranc joint, along and along the proximal intermetatarsal articulations. Electronically Signed   By: Van Clines M.D.   On: 01/07/2017 07:57   Dg Foot 2 Views Right  Result Date: 01/06/2017 CLINICAL DATA:  Soft tissue ulceration plantar site right fifth toe. EXAM: RIGHT FOOT - 2 VIEW COMPARISON:  None. FINDINGS: Mild hallux valgus deformity. No evidence of fracture dislocation. Minimal degenerative change over the midfoot. Mild soft tissue prominence and irregularity involving the plantar aspect at the level of the MTP joints on the lateral film slightly improved. No definite air within the soft tissues. No evidence of bone destruction to suggest osteomyelitis. IMPRESSION: Improving soft tissue prominence and irregularity along the plantar aspect of the foot at the level of the MTP joints. No significant air in the soft tissues and no evidence of osteomyelitis. Electronically Signed   By: Marin Olp M.D.   On: 01/06/2017 22:42   Dg Foot Complete Right  Result Date: 12/31/2016 CLINICAL DATA:  Initial evaluation for soft tissue ulcer, evaluate for osteomyelitis. EXAM: RIGHT FOOT COMPLETE - 3+ VIEW COMPARISON:  None. FINDINGS: Soft tissue irregularity consistent with ulceration present at the plantar aspect of the forefoot. No radiopaque foreign body or dissecting soft tissue emphysema. No radiographic findings to suggest active osteomyelitis. No acute fracture or dislocation. IMPRESSION: 1. Soft tissue ulceration at the plantar and medial aspect of the right forefoot. No radiopaque foreign body or dissecting soft tissue emphysema. No evidence for osteomyelitis.  2. No other acute osseous abnormality about the foot. Electronically Signed   By: Jeannine Boga M.D.   On: 12/31/2016 19:18    Microbiology: Recent Results (from the past 240 hour(s))  MRSA PCR Screening     Status: Abnormal   Collection Time: 01/10/17 11:18 AM  Result Value Ref Range Status   MRSA by PCR POSITIVE (A) NEGATIVE Final    Comment:        The GeneXpert MRSA Assay (FDA approved for NASAL specimens only), is one component of a comprehensive MRSA colonization surveillance program. It is not intended to diagnose MRSA infection nor to guide or monitor treatment for MRSA infections. RESULT CALLED TO, READ BACK BY AND VERIFIED WITH: Urbano Heir RN 13:30 01/10/17 ([email or web address])      Labs: Basic Metabolic Panel:  Recent Labs Lab 01/12/17 0451 01/13/17 0449 01/14/17 0517 01/15/17 0511 01/16/17 0505  NA 140 138 138 138 138  K 4.2 3.8 3.7 3.8 3.9  CL 104 105 103 103 103  CO2 28 24 25 24 26   GLUCOSE 164* 91 124* 189* 135*  BUN 17 18 19  25* 26*  CREATININE 3.45* 3.22* 3.17* 3.25* 3.20*  CALCIUM 9.1 9.2 9.3 9.3 9.3   Liver Function Tests:  Recent Labs Lab 01/15/17 0511  AST 21  ALT 20  ALKPHOS 63  BILITOT 0.3  PROT 7.4  ALBUMIN 3.0*   No results for input(s): LIPASE, AMYLASE in the last 168 hours. No results for input(s): AMMONIA in the last 168 hours. CBC:  Recent Labs Lab 01/12/17 0451 01/15/17 0511  WBC 7.4 7.1  HGB 10.5* 10.6*  HCT 32.7* 32.0*  MCV 85.6 84.2  PLT 246 272   Cardiac Enzymes: No results for input(s): CKTOTAL, CKMB, CKMBINDEX, TROPONINI in the last 168 hours. BNP: BNP (last 3 results) No results for input(s): BNP in the last 8760 hours.  ProBNP (last 3 results) No results for input(s): PROBNP in the last 8760 hours.  CBG:  Recent Labs Lab 01/15/17 1132 01/15/17 1637 01/15/17 2206 01/16/17 0637 01/16/17 1146  GLUCAP 165* 228* 196* 124* 112*       Signed:  Nita Sells MD   [facility] 01/16/2017, 2:27 PM

## 2017-01-16 NOTE — Progress Notes (Signed)
Dressing change done

## 2017-01-16 NOTE — Progress Notes (Signed)
Pt discharge instructions reviewed with pt. Pt verbalizes understanding. Pt belongings with pt. Pt is not in distress. Pt discharged via wheelchair.

## 2017-01-20 ENCOUNTER — Ambulatory Visit (INDEPENDENT_AMBULATORY_CARE_PROVIDER_SITE_OTHER): Payer: Medicare Other | Admitting: Family Medicine

## 2017-01-20 ENCOUNTER — Other Ambulatory Visit: Payer: Self-pay | Admitting: Family Medicine

## 2017-01-20 ENCOUNTER — Encounter: Payer: Self-pay | Admitting: Family Medicine

## 2017-01-20 ENCOUNTER — Other Ambulatory Visit: Payer: Self-pay | Admitting: *Deleted

## 2017-01-20 ENCOUNTER — Telehealth: Payer: Self-pay | Admitting: Family Medicine

## 2017-01-20 VITALS — BP 122/70 | HR 88 | Temp 98.5°F | Resp 16 | Ht 69.0 in | Wt 264.0 lb

## 2017-01-20 DIAGNOSIS — N179 Acute kidney failure, unspecified: Secondary | ICD-10-CM

## 2017-01-20 DIAGNOSIS — M86671 Other chronic osteomyelitis, right ankle and foot: Secondary | ICD-10-CM

## 2017-01-20 DIAGNOSIS — E11621 Type 2 diabetes mellitus with foot ulcer: Secondary | ICD-10-CM | POA: Diagnosis not present

## 2017-01-20 DIAGNOSIS — L97509 Non-pressure chronic ulcer of other part of unspecified foot with unspecified severity: Secondary | ICD-10-CM | POA: Diagnosis not present

## 2017-01-20 DIAGNOSIS — Z794 Long term (current) use of insulin: Secondary | ICD-10-CM | POA: Diagnosis not present

## 2017-01-20 LAB — COMPLETE METABOLIC PANEL WITH GFR
AG Ratio: 0.9 (calc) — ABNORMAL LOW (ref 1.0–2.5)
ALT: 15 U/L (ref 9–46)
AST: 16 U/L (ref 10–35)
Albumin: 3.9 g/dL (ref 3.6–5.1)
Alkaline phosphatase (APISO): 68 U/L (ref 40–115)
BUN/Creatinine Ratio: 12 (calc) (ref 6–22)
BUN: 32 mg/dL — ABNORMAL HIGH (ref 7–25)
CO2: 24 mmol/L (ref 20–32)
Calcium: 9.6 mg/dL (ref 8.6–10.3)
Chloride: 100 mmol/L (ref 98–110)
Creat: 2.64 mg/dL — ABNORMAL HIGH (ref 0.70–1.33)
GFR, Est African American: 31 mL/min/{1.73_m2} — ABNORMAL LOW (ref >=60)
GFR, Est Non African American: 27 mL/min/{1.73_m2} — ABNORMAL LOW (ref >=60)
Globulin: 4.2 g/dL (calc) — ABNORMAL HIGH (ref 1.9–3.7)
Glucose, Bld: 216 mg/dL — ABNORMAL HIGH (ref 65–99)
Potassium: 4.5 mmol/L (ref 3.5–5.3)
Sodium: 138 mmol/L (ref 135–146)
Total Bilirubin: 0.4 mg/dL (ref 0.2–1.2)
Total Protein: 8.1 g/dL (ref 6.1–8.1)

## 2017-01-20 LAB — CBC WITH DIFFERENTIAL/PLATELET
Basophils Absolute: 51 cells/uL (ref 0–200)
Basophils Relative: 0.6 %
Eosinophils Absolute: 281 cells/uL (ref 15–500)
Eosinophils Relative: 3.3 %
HCT: 31.4 % — ABNORMAL LOW (ref 38.5–50.0)
Hemoglobin: 10.5 g/dL — ABNORMAL LOW (ref 13.2–17.1)
Lymphs Abs: 1811 cells/uL (ref 850–3900)
MCH: 28.1 pg (ref 27.0–33.0)
MCHC: 33.4 g/dL (ref 32.0–36.0)
MCV: 84 fL (ref 80.0–100.0)
MPV: 9.8 fL (ref 7.5–12.5)
Monocytes Relative: 8.7 %
Neutro Abs: 5619 cells/uL (ref 1500–7800)
Neutrophils Relative %: 66.1 %
Platelets: 346 10*3/uL (ref 140–400)
RBC: 3.74 10*6/uL — ABNORMAL LOW (ref 4.20–5.80)
RDW: 13.3 % (ref 11.0–15.0)
Total Lymphocyte: 21.3 %
WBC mixed population: 740 cells/uL (ref 200–950)
WBC: 8.5 10*3/uL (ref 3.8–10.8)

## 2017-01-20 MED ORDER — DARUNAVIR-COBICISTAT 800-150 MG PO TABS: 1.0000 | ORAL_TABLET | Freq: Every day | ORAL | 6 refills | Status: DC

## 2017-01-20 MED ORDER — ABACAVIR-DOLUTEGRAVIR-LAMIVUD 600-50-300 MG PO TABS: 1.0000 | ORAL_TABLET | Freq: Every day | ORAL | 6 refills | Status: DC

## 2017-01-20 NOTE — Telephone Encounter (Signed)
Please contact Dr. Jess Barters office at  [name] [id number]       [phone]  Please ensure that patient has a 1 week follow-up scheduled.

## 2017-01-20 NOTE — Progress Notes (Unsigned)
Home Health Advance Doxycycline, he hasn't picked up his medication from the pharmacy.  Eliminating  all medication that are nephrotoxic.  Drinking plenty of water.

## 2017-01-20 NOTE — Telephone Encounter (Signed)
Patient schedule for 10/18 at 9am with [name]

## 2017-01-21 DIAGNOSIS — E11621 Type 2 diabetes mellitus with foot ulcer: Secondary | ICD-10-CM | POA: Diagnosis not present

## 2017-01-21 DIAGNOSIS — B20 Human immunodeficiency virus [HIV] disease: Secondary | ICD-10-CM | POA: Diagnosis not present

## 2017-01-21 DIAGNOSIS — E1165 Type 2 diabetes mellitus with hyperglycemia: Secondary | ICD-10-CM | POA: Diagnosis not present

## 2017-01-21 DIAGNOSIS — E1140 Type 2 diabetes mellitus with diabetic neuropathy, unspecified: Secondary | ICD-10-CM | POA: Diagnosis not present

## 2017-01-21 DIAGNOSIS — Z794 Long term (current) use of insulin: Secondary | ICD-10-CM | POA: Diagnosis not present

## 2017-01-21 DIAGNOSIS — D649 Anemia, unspecified: Secondary | ICD-10-CM | POA: Diagnosis not present

## 2017-01-21 DIAGNOSIS — I10 Essential (primary) hypertension: Secondary | ICD-10-CM | POA: Diagnosis not present

## 2017-01-21 DIAGNOSIS — L97411 Non-pressure chronic ulcer of right heel and midfoot limited to breakdown of skin: Secondary | ICD-10-CM | POA: Diagnosis not present

## 2017-01-22 NOTE — Progress Notes (Signed)
[name], male    DOB: Oct 13, 1965, 51 y.o.   MRN: [id number]  PCP: Scot Jun, FNP  Chief Complaint  Patient presents with  . Follow-up    Subjective:  HPI Keith [name] is a 51 y.o. male presents today as a walk-in requesting a renal function check today. [name] was recently admitted in the hospital for osteomyelitis secondary to a diabetic foot ulcer. He remained in patient on IV antibiotics for total of 10 days. During his admission he suffered acute kidney injury secondary to administration of vancomycin. Last creatinine 3.20, with a GFR of 24. He was discharged on oral doxycycline and with home health services. He reports today he has not picked up his oral antibiotics but intends to pick up antibiotics today and start them. He is followed by  Dr. Meridee Score for management of osteomyelitis and has upcoming appointment scheduled for 01/23/2017. He was prescribed a postop shoe which she does not have on today, although notes he has it at home. Keith [name] has an extensive history of noncompliance with medication.  He reports that he is currently administering his insulin as prescribed currently on 20 units once daily at 10 PM. He was on metformin however this was discontinued due to the AK I and he was switched to glimepiride 1 mg once daily with breakfast.  Social History   Social History  . Marital status: Single    Spouse name: N/A  . Number of children: 64  . Years of education: 12   Occupational History  . Not on file.   Social History Main Topics  . Smoking status: Never Smoker  . Smokeless tobacco: Never Used  . Alcohol use No  . Drug use: No  . Sexual activity: Yes    Partners: Female     Comment: given condoms   Other Topics Concern  . Not on file   Social History Narrative   Worked for the city of [place] for 18 years.   Unemployed.    Applying for disability.   Medicaid patient.    Family History  Problem Relation Age of Onset  .  Hypertension Mother   . Arthritis Father   . Hypertension Father   . Hypertension Brother   . Cancer Maternal Grandmother 37       unknown type of cancer  . Depression Paternal Grandmother    Review of Systems See history of present illness Patient Active Problem List   Diagnosis Date Noted  . Diabetic foot ulcer associated with diabetes mellitus due to underlying condition ([place]) 01/07/2017  . Osteomyelitis ([place]) 01/07/2017  . Dehydration 09/29/2016  . AKI (acute kidney injury) ([place]) 09/28/2016  . Diarrhea with dehydration 09/28/2016  . Nausea vomiting and diarrhea 09/28/2016  . Cellulitis and abscess of hand 06/04/2016  . Cellulitis of left hand 06/04/2016  . Onychomycosis of multiple toenails with type 2 diabetes mellitus ([place]) 08/29/2015  . MRSA carrier 04/20/2014  . Testosterone deficiency 04/20/2014  . Genital warts 02/22/2014  . HIV disease ([place])   . Insulin-requiring or dependent type II diabetes mellitus ([place]) 02/04/2014  . Dental anomaly 11/20/2012  . Arthritis of right knee 02/23/2012  . Hyperlipidemia 11/10/2011  . Chronic pain 08/07/2011  . Meralgia paraesthetica 04/23/2011  . ERECTILE DYSFUNCTION 08/22/2008  . Essential hypertension 05/19/2008    Allergies  Allergen Reactions  . Sulfa Antibiotics Itching    Prior to Admission medications   Medication Sig Start Date End Date Taking? Authorizing Provider  abacavir-dolutegravir-lamiVUDine (TRIUMEQ) 600-50-300 MG tablet Take 1 tablet by mouth daily. 01/20/17   [name], MD  amLODipine (NORVASC) 10 MG tablet Take 1 tablet (10 mg total) by mouth daily. 01/17/17   Nita Sells, MD  cyclobenzaprine (FLEXERIL) 10 MG tablet Take 1 tablet (10 mg total) by mouth 2 (two) times daily as needed for muscle spasms. 10/25/16   Ashley Murrain, NP  darunavir-cobicistat (PREZCOBIX) 800-150 MG tablet Take 1 tablet by mouth daily. Swallow whole. Do NOT crush, break or chew tablets. Take with food. 01/20/17    [name], MD  doxycycline (VIBRA-TABS) 100 MG tablet Take 1 tablet (100 mg total) by mouth 2 (two) times daily. 01/09/17   [name], MD  doxycycline (VIBRA-TABS) 100 MG tablet Take 1 tablet (100 mg total) by mouth every 12 (twelve) hours. 01/16/17   Nita Sells, MD  glimepiride (AMARYL) 1 MG tablet Take 1 tablet (1 mg total) by mouth every morning. 01/16/17 01/16/18  Nita Sells, MD  Insulin Glargine (LANTUS SOLOSTAR) 100 UNIT/ML Solostar Pen Inject 20 Units into the skin daily at 10 pm. 01/16/17   Nita Sells, MD  Insulin Syringes, Disposable, U-100 0.3 ML MISC For administration of insulin TID. ICD E-11.9 10/29/16   Scot Jun, FNP  LORazepam (ATIVAN) 1 MG tablet Take 1 tablet (1 mg total) by mouth every 6 (six) hours as needed for anxiety. 01/30/15   [name], MD  naloxone [facility]) nasal spray 4 mg/0.1 mL Place 1 spray into the nose daily as needed (opioid overdose).    [provider]  Oxycodone HCl 10 MG TABS Take 1 tablet (10 mg total) by mouth every 8 (eight) hours as needed (moderate or severe pain). Patient taking differently: Take 10 mg by mouth 4 (four) times daily.  09/29/16   Nita Sells, MD    Past Medical, Surgical Family and Social History reviewed and updated.    Objective:   Today's Vitals   01/20/17 1323  BP: 122/70  Pulse: 88  Resp: 16  Temp: 98.5 F (36.9 C)  TempSrc: Oral  SpO2: 100%  Weight: 264 lb (119.7 kg)  Height: 5\' 9"  (1.753 m)    Wt Readings from Last 3 Encounters:  01/20/17 264 lb (119.7 kg)  01/09/17 260 lb (117.9 kg)  01/01/17 266 lb (120.7 kg)   Physical Exam  Constitutional: He is oriented to person, place, and time. He appears well-developed and well-nourished.  HENT:  Head: Normocephalic and atraumatic.  Right Ear: External ear normal.  Left Ear: External ear normal.  Nose: Nose normal.  Mouth/Throat: Oropharynx is clear and moist.  Eyes: Pupils are equal, round,  and reactive to light. Conjunctivae and EOM are normal.  Cardiovascular: Normal rate, regular rhythm, normal heart sounds and intact distal pulses.   Pulmonary/Chest: Effort normal and breath sounds normal.  Musculoskeletal:  Right foot bandage , dressing clean dry and intact  Neurological: He is alert and oriented to person, place, and time.  Psychiatric: He has a normal mood and affect. His behavior is normal. Judgment and thought content normal.   Assessment & Plan:  1. AKI (acute kidney injury) ([place]), Checking a CMP today to evaluate current creatinine and GFR. Will continue to monitor closely with weekly or bi-weekly labs to ensure creatinine is trending down. If no improvement will refer to nephrology. 2. Other chronic osteomyelitis of right foot ([place])- CBC with Differential/Platelet. Reinforced the importance of starting oral antibiotics and following up with Dr.Duda.  3.  Type 2 Diabetes, uncontrolled, Last A1C  8.2. Continue tight glycemic control to facilitate wound healing. .  You will be notified of your lab results. Keep previouslyscheduled follow-up.   Carroll Sage. Kenton Kingfisher, MSN, FNP-C [facility]  [address] Cower [place], [place] [address] [phone]

## 2017-01-23 ENCOUNTER — Encounter (INDEPENDENT_AMBULATORY_CARE_PROVIDER_SITE_OTHER): Payer: Self-pay | Admitting: Orthopedic Surgery

## 2017-01-23 ENCOUNTER — Ambulatory Visit (INDEPENDENT_AMBULATORY_CARE_PROVIDER_SITE_OTHER): Payer: Medicare Other | Admitting: Orthopedic Surgery

## 2017-01-23 DIAGNOSIS — L97511 Non-pressure chronic ulcer of other part of right foot limited to breakdown of skin: Secondary | ICD-10-CM

## 2017-01-23 DIAGNOSIS — E1142 Type 2 diabetes mellitus with diabetic polyneuropathy: Secondary | ICD-10-CM | POA: Insufficient documentation

## 2017-01-23 NOTE — Progress Notes (Signed)
Office Visit Note   Patient: Keith [name]           Date of Birth: 1966-01-23           MRN: [id number] Visit Date: 01/23/2017              Requested by: Scot Jun, [place], [place] [address] PCP: Scot Jun, FNP  No chief complaint on file.     HPI: Patient is a 51 year old gentleman who is follows up from the hospital he was admitted for about 10 days with cellulitis and a Wagner grade 1 ulcer of the right first metatarsal head. Patient is currently on doxycycline currently in a Darco shoe.  Assessment & Plan: Visit Diagnoses:  1. Right foot ulcer, limited to breakdown of skin ([place])   2. Diabetic polyneuropathy associated with type 2 diabetes mellitus ([place])     Plan: we'll continue the Darco shoe minimize weightbearing start Dial soap cleansing and a Band-Aidibiotic ointment daily. He will follow-up in 2 weeks and bring his sneakers we will make a felt relieving donated at follow-up and anticipate he can resume normal activities he will complete his course of doxycycline.  Follow-Up Instructions: Return in about 2 weeks (around 02/06/2017).   Ortho Exam  Patient is alert, oriented, no adenopathy, well-dressed, normal affect, normal respiratory effort. Examination patient has a strong dorsalis pedis pulse. He has a Hydrographic surveyor grade 1 ulcer beneath the first metatarsal head there is no redness redness no cellulitis no odor no drainage no signs of infection. After informed consent a 10 blade knife was used to debride the skin and soft tissue back to bleeding viable granulation tissue. This was touched with silver nitrate. The ulcer is 25 mm in diameter 0.1 mm deep after debridement.  Imaging: No results found. No images are attached to the encounter.  Labs: Lab Results  Component Value Date   HGBA1C 8.2 01/01/2017   HGBA1C 8.5 10/24/2016   HGBA1C 13.2 (H) 06/04/2016   ESRSEDRATE 94 (H) 01/07/2017   ESRSEDRATE 80 (H) 07/25/2009   ESRSEDRATE  105 (H) 07/09/2009   CRP 1.8 (H) 01/08/2017   CRP 0.4 (L) 07/25/2009   CRP 4.3 (H) 07/09/2009   LABURIC 6.3 08/07/2009   REPTSTATUS 11/20/2016 FINAL 11/18/2016   GRAMSTAIN  06/04/2016    FEW WBC PRESENT,BOTH PMN AND MONONUCLEAR MODERATE GRAM POSITIVE COCCI IN PAIRS IN CLUSTERS    CULT (A) 11/18/2016    >=100,000 COLONIES/mL VIRIDANS STREPTOCOCCUS CALL MICROBIOLOGY LAB IF SENSITIVITIES ARE REQUIRED.    LABORGA METHICILLIN RESISTANT STAPHYLOCOCCUS AUREUS 06/04/2016    Orders:  No orders of the defined types were placed in this encounter.  No orders of the defined types were placed in this encounter.    Procedures: No procedures performed  Clinical Data: No additional findings.  ROS:  All other systems negative, except as noted in the HPI. Review of Systems  Objective: Vital Signs: There were no vitals taken for this visit.  Specialty Comments:  No specialty comments available.  PMFS History: Patient Active Problem List   Diagnosis Date Noted  . Diabetic polyneuropathy associated with type 2 diabetes mellitus ([place]) 01/23/2017  . Right foot ulcer, limited to breakdown of skin ([place]) 01/07/2017  . Osteomyelitis ([place]) 01/07/2017  . Dehydration 09/29/2016  . AKI (acute kidney injury) ([place]) 09/28/2016  . Diarrhea with dehydration 09/28/2016  . Nausea vomiting and diarrhea 09/28/2016  . Cellulitis and abscess of hand 06/04/2016  . Cellulitis of left  hand 06/04/2016  . Onychomycosis of multiple toenails with type 2 diabetes mellitus ([place]) 08/29/2015  . MRSA carrier 04/20/2014  . Testosterone deficiency 04/20/2014  . Genital warts 02/22/2014  . HIV disease ([place])   . Insulin-requiring or dependent type II diabetes mellitus ([place]) 02/04/2014  . Dental anomaly 11/20/2012  . Arthritis of right knee 02/23/2012  . Hyperlipidemia 11/10/2011  . Chronic pain 08/07/2011  . Meralgia paraesthetica 04/23/2011  . ERECTILE DYSFUNCTION 08/22/2008  . Essential hypertension  05/19/2008   Past Medical History:  Diagnosis Date  . AIDS ([place])   . Chronic knee pain    right  . Chronic pain   . Diabetes mellitus   . Diabetes type 2, uncontrolled ([place])    HgA1c 17.6 (04/27/2010)  . Diabetic foot ulcer ([place]) 01/2017   right foot  . Erectile dysfunction   . Genital warts   . HIV (human immunodeficiency virus infection) ([place]) 2009   CD4 count 100, VL 13800 (05/01/2010)  . Hypertension   . Osteomyelitis ([place])    h/o hand    Family History  Problem Relation Age of Onset  . Hypertension Mother   . Arthritis Father   . Hypertension Father   . Hypertension Brother   . Cancer Maternal Grandmother 21       unknown type of cancer  . Depression Paternal Grandmother     Past Surgical History:  Procedure Laterality Date  . HERNIA REPAIR    . I&D EXTREMITY Left 08/21/2014   Procedure: INCISION AND DRAINAGE LEFT SMALL FINGER;  Surgeon: Leanora Cover, MD;  Location: [facility];  Service: Orthopedics;  Laterality: Left;  . MINOR IRRIGATION AND DEBRIDEMENT OF WOUND Right 04/22/2014   Procedure: IRRIGATION AND DEBRIDEMENT OF RIGHT NECK ABCESS;  Surgeon: Jerrell Belfast, MD;  Location: [facility];  Service: ENT;  Laterality: Right;  . MULTIPLE EXTRACTIONS WITH ALVEOLOPLASTY N/A 01/18/2013   Procedure: MULTIPLE EXTRACION 3, 6, 7, 10, 11, 13, 21, 22, 27, 28, 29, 30 WITH ALVEOLOPLASTY;  Surgeon: Gae Bon, DDS;  Location: [facility];  Service: Oral Surgery;  Laterality: N/A;   Social History   Occupational History  . Not on file.   Social History Main Topics  . Smoking status: Never Smoker  . Smokeless tobacco: Never Used  . Alcohol use No  . Drug use: No  . Sexual activity: Yes    Partners: Female     Comment: given condoms

## 2017-01-28 IMAGING — CR DG KNEE COMPLETE 4+V*R*
4 series · 4 of 4 positions shown · non-contrast
Comparison: 12/11/2014

CLINICAL DATA: Anterior right knee pain after a fall today.

EXAM:
RIGHT KNEE - COMPLETE 4+ VIEW

[knee ap]
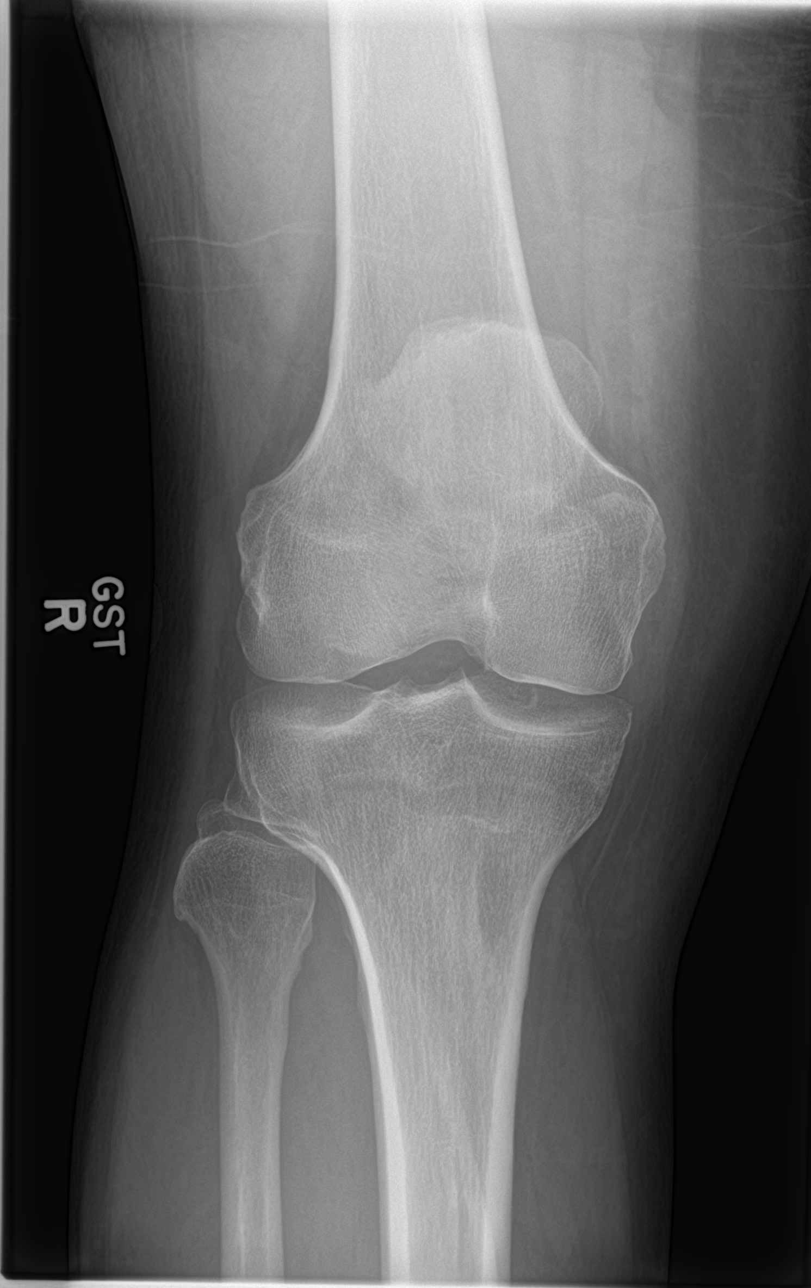

[knee lat]
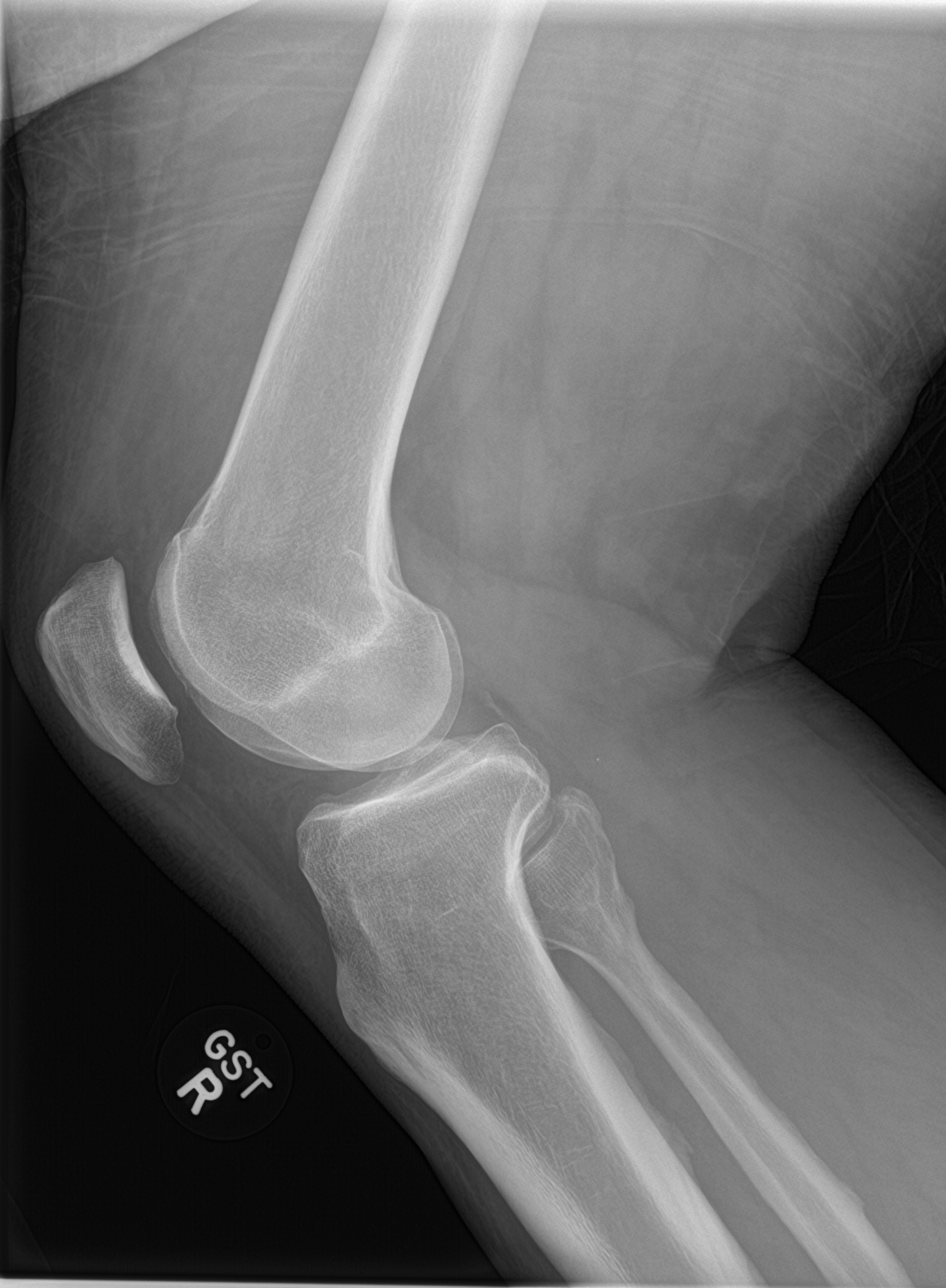

[knee obl (1 of 2)]
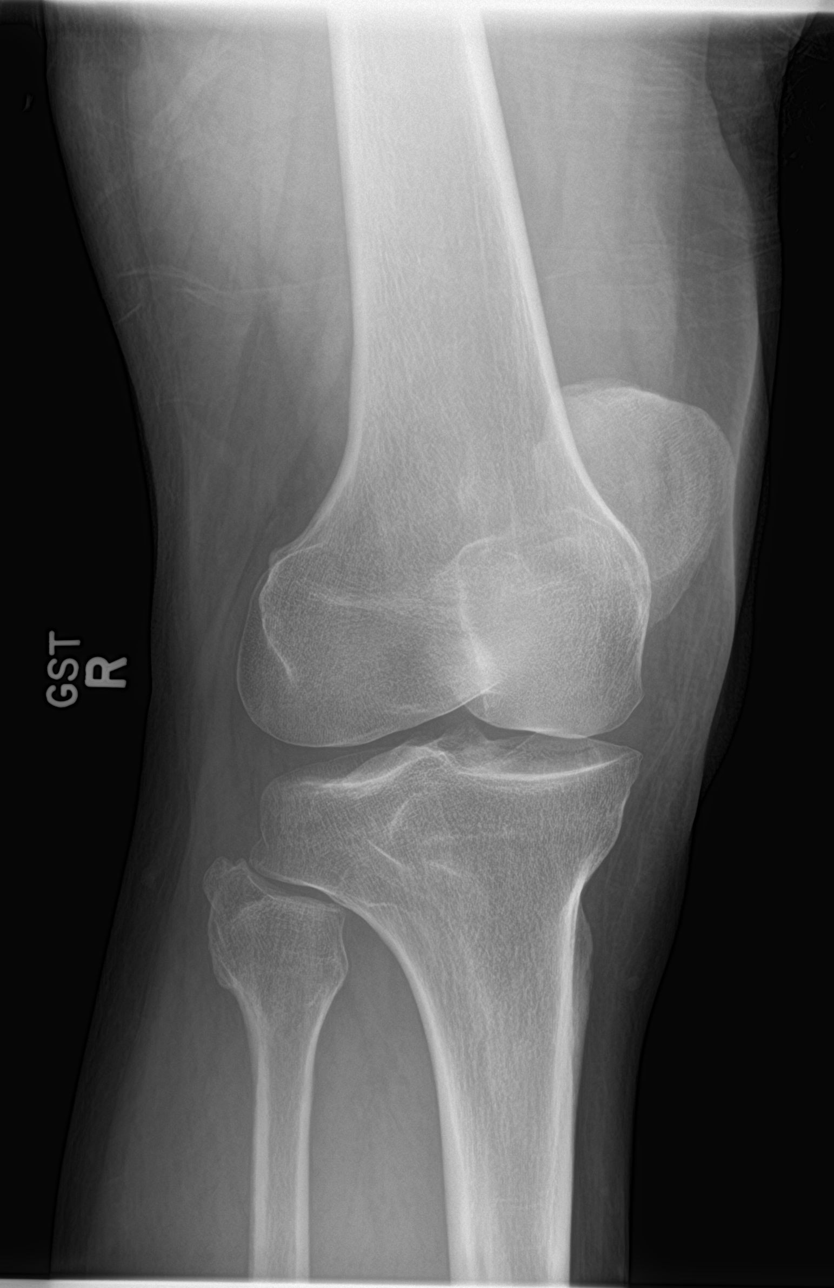

[knee obl (2 of 2)]
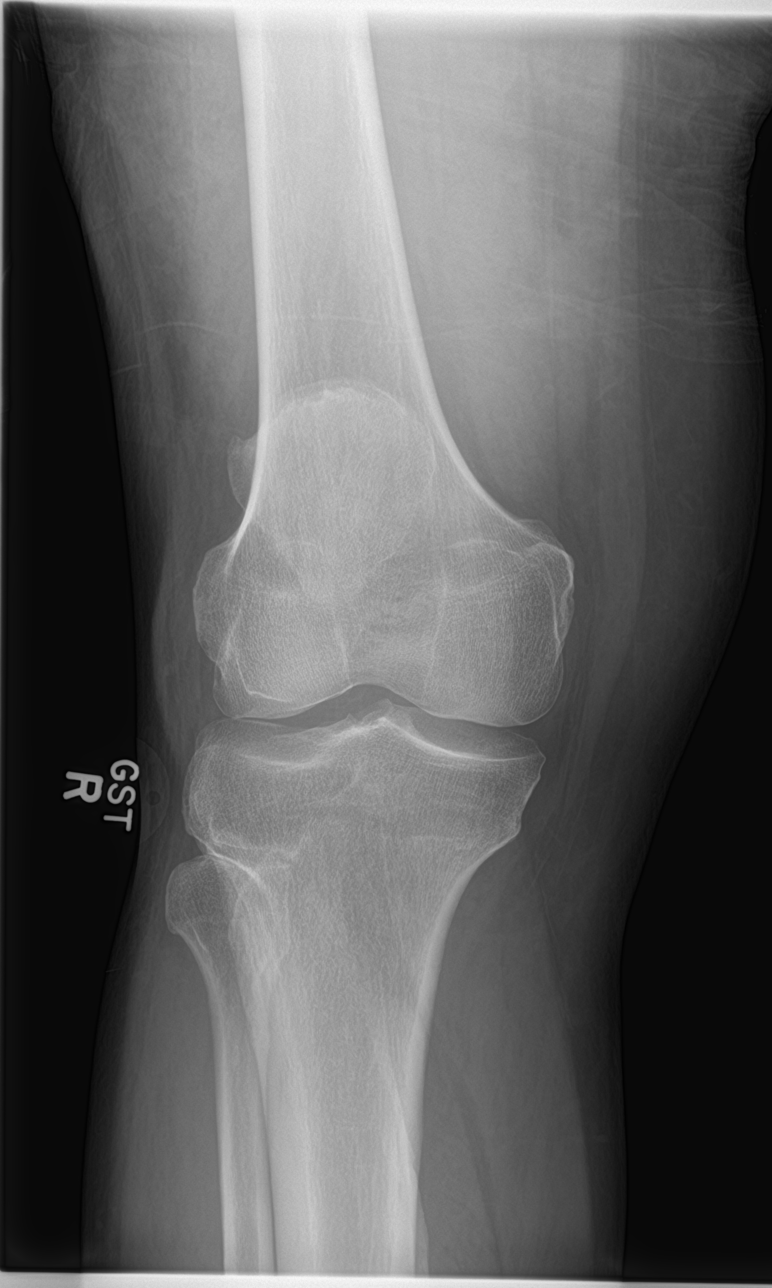

[4 of 4 positions shown; findings below may reference images not displayed]

FINDINGS: No evidence of fracture, dislocation, or joint effusion. No evidence
of arthropathy or other focal bone abnormality. Soft tissues are
unremarkable.
IMPRESSION: Negative.

## 2017-01-28 IMAGING — CR DG CHEST 2V
2 series · 2 of 2 positions shown · non-contrast
Comparison: 01/26/2016

CLINICAL DATA: Cough for 4 days.  Fall today.

EXAM:
CHEST  2 VIEW

[chest pa]
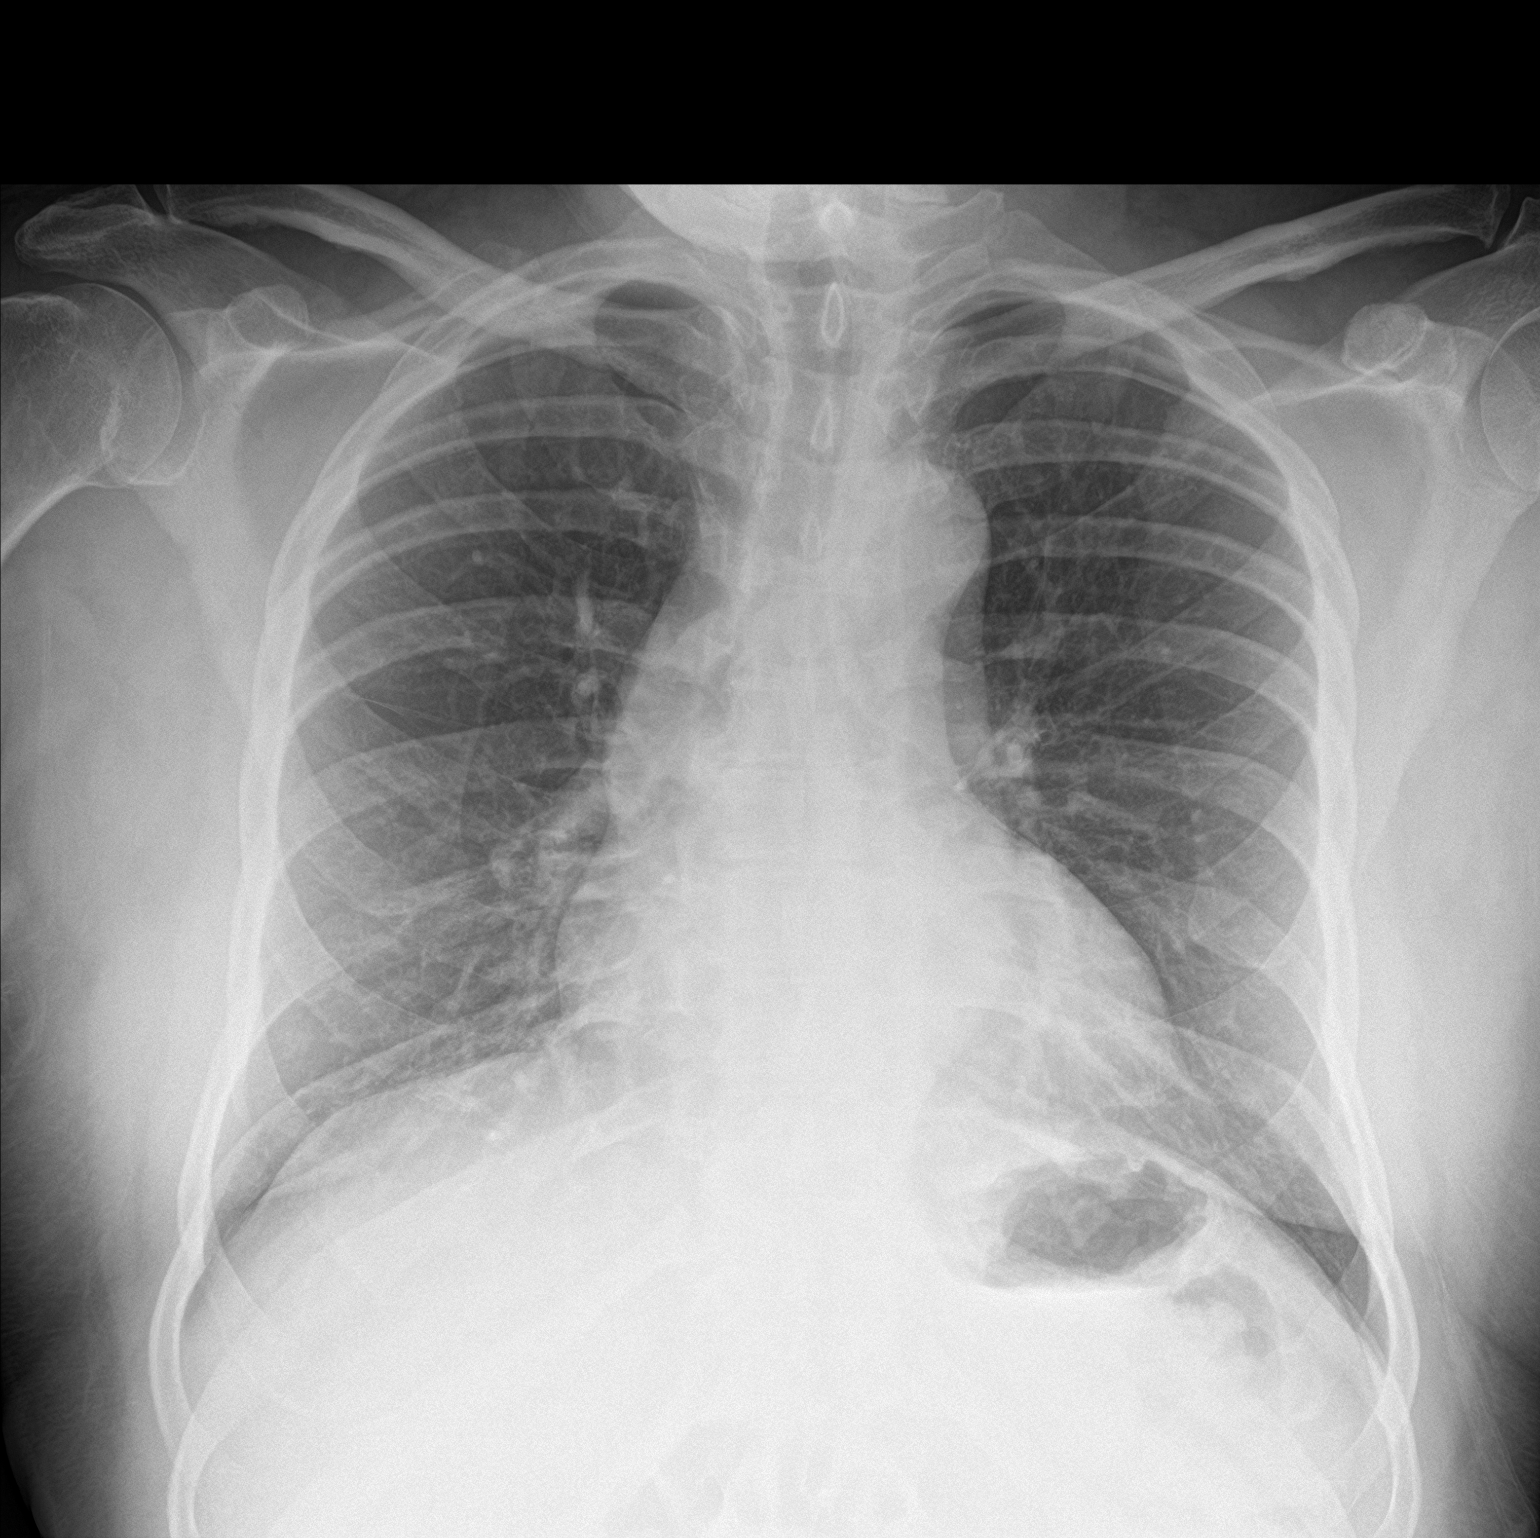

[chest lat]
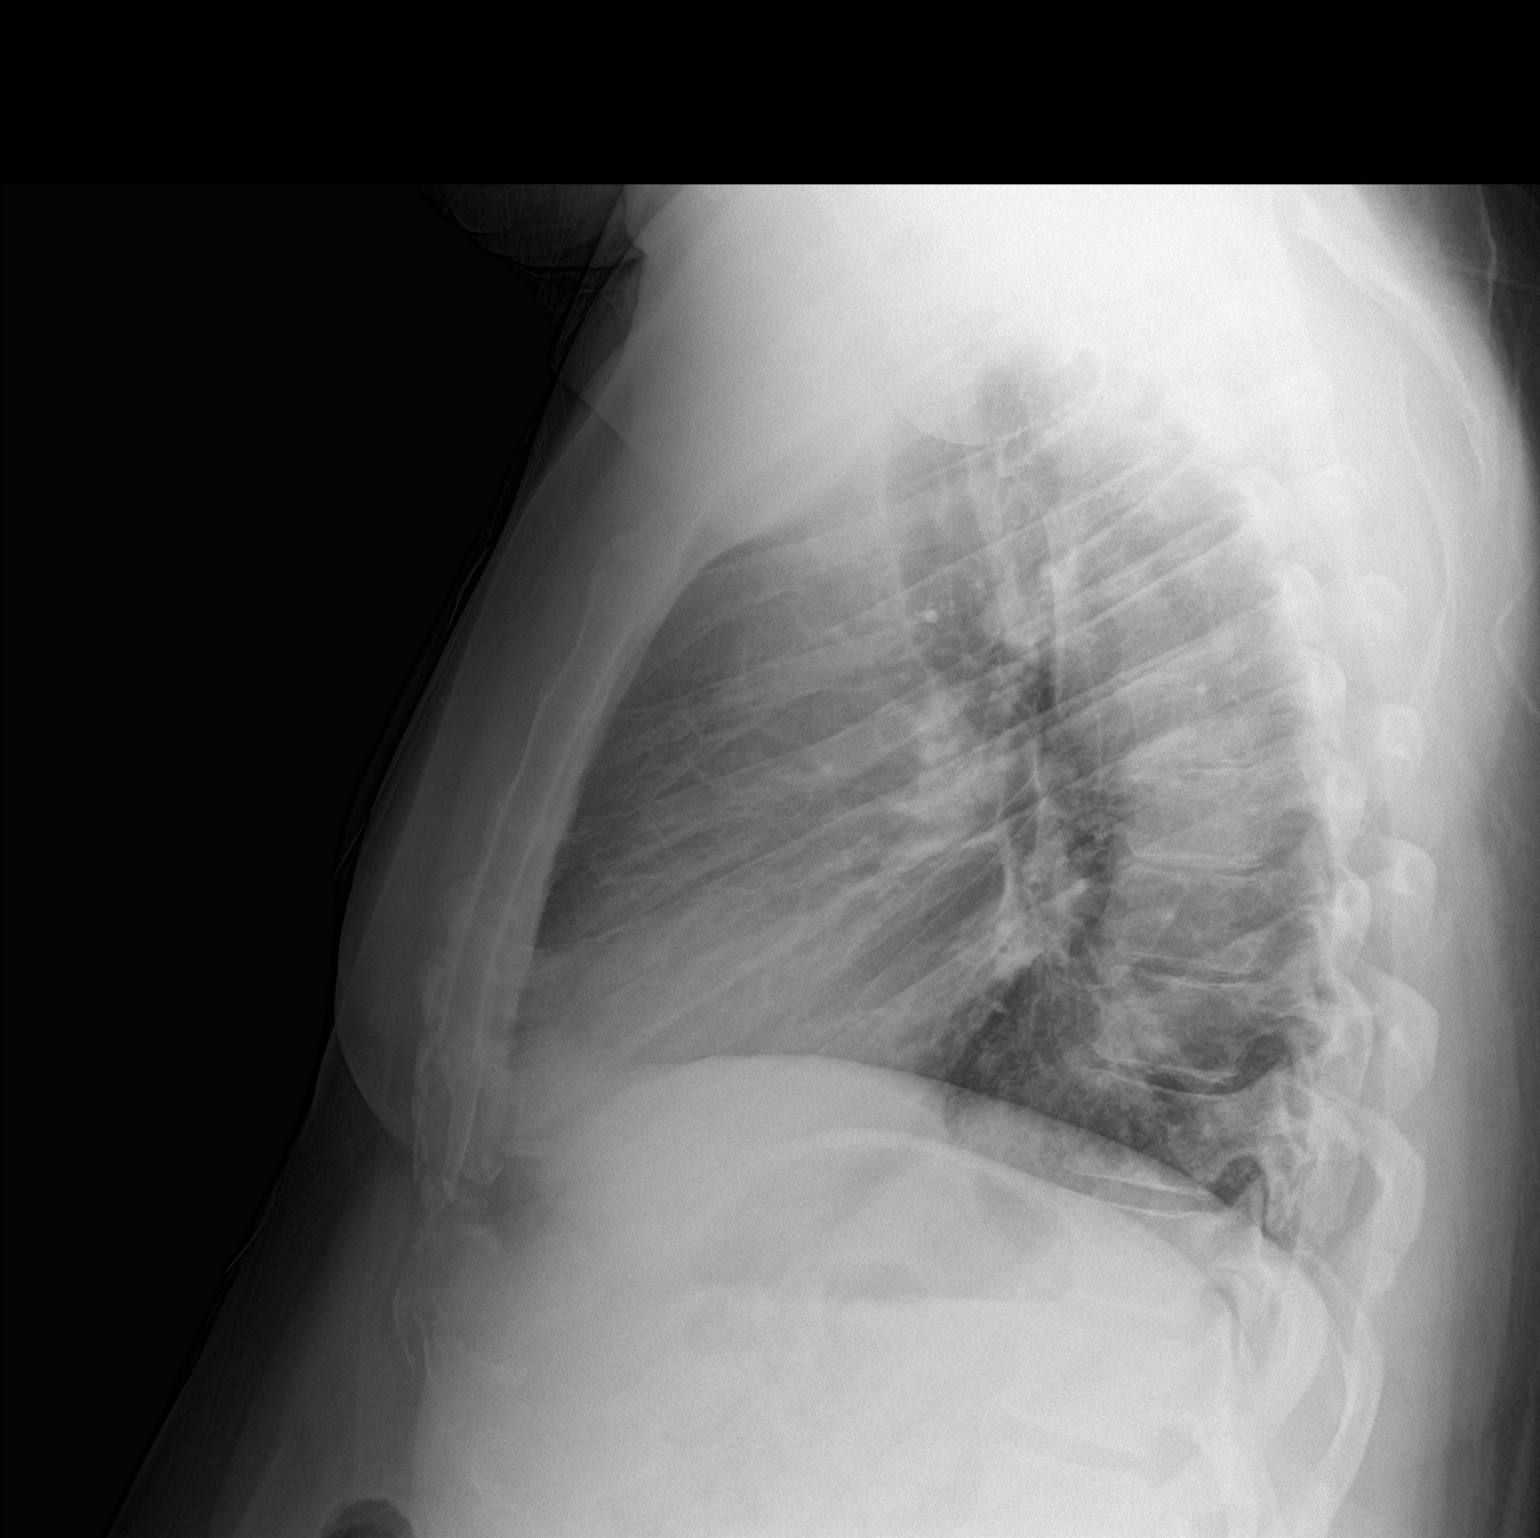

[2 of 2 positions shown; findings below may reference images not displayed]

FINDINGS: Normal heart size and pulmonary vascularity. No focal airspace
disease or consolidation in the lungs. No blunting of costophrenic
angles. No pneumothorax. Mediastinal contours appear intact.
Tortuous aorta.
IMPRESSION: No active cardiopulmonary disease.

## 2017-01-30 DIAGNOSIS — E1140 Type 2 diabetes mellitus with diabetic neuropathy, unspecified: Secondary | ICD-10-CM | POA: Diagnosis not present

## 2017-01-30 DIAGNOSIS — D649 Anemia, unspecified: Secondary | ICD-10-CM | POA: Diagnosis not present

## 2017-01-30 DIAGNOSIS — L97411 Non-pressure chronic ulcer of right heel and midfoot limited to breakdown of skin: Secondary | ICD-10-CM | POA: Diagnosis not present

## 2017-01-30 DIAGNOSIS — B20 Human immunodeficiency virus [HIV] disease: Secondary | ICD-10-CM | POA: Diagnosis not present

## 2017-01-30 DIAGNOSIS — E11621 Type 2 diabetes mellitus with foot ulcer: Secondary | ICD-10-CM | POA: Diagnosis not present

## 2017-01-30 DIAGNOSIS — E1165 Type 2 diabetes mellitus with hyperglycemia: Secondary | ICD-10-CM | POA: Diagnosis not present

## 2017-01-31 ENCOUNTER — Telehealth (INDEPENDENT_AMBULATORY_CARE_PROVIDER_SITE_OTHER): Payer: Self-pay | Admitting: Orthopedic Surgery

## 2017-01-31 NOTE — Telephone Encounter (Signed)
I called and spoke with Keith [name] advised antibiotic ointment with band aid and dial soap cleansing.

## 2017-01-31 NOTE — Telephone Encounter (Signed)
Margaretha Sheffield (nurse) with [facility] called needing clarification on WD care orders. The number to contact Margaretha Sheffield is [phone]

## 2017-02-04 DIAGNOSIS — E11621 Type 2 diabetes mellitus with foot ulcer: Secondary | ICD-10-CM | POA: Diagnosis not present

## 2017-02-04 DIAGNOSIS — D649 Anemia, unspecified: Secondary | ICD-10-CM | POA: Diagnosis not present

## 2017-02-04 DIAGNOSIS — B20 Human immunodeficiency virus [HIV] disease: Secondary | ICD-10-CM | POA: Diagnosis not present

## 2017-02-04 DIAGNOSIS — E1140 Type 2 diabetes mellitus with diabetic neuropathy, unspecified: Secondary | ICD-10-CM | POA: Diagnosis not present

## 2017-02-04 DIAGNOSIS — L97411 Non-pressure chronic ulcer of right heel and midfoot limited to breakdown of skin: Secondary | ICD-10-CM | POA: Diagnosis not present

## 2017-02-04 DIAGNOSIS — E1165 Type 2 diabetes mellitus with hyperglycemia: Secondary | ICD-10-CM | POA: Diagnosis not present

## 2017-02-13 ENCOUNTER — Ambulatory Visit (INDEPENDENT_AMBULATORY_CARE_PROVIDER_SITE_OTHER): Payer: Medicare Other | Admitting: Orthopedic Surgery

## 2017-02-13 ENCOUNTER — Encounter (INDEPENDENT_AMBULATORY_CARE_PROVIDER_SITE_OTHER): Payer: Self-pay

## 2017-02-23 IMAGING — DX DG ABDOMEN ACUTE W/ 1V CHEST
3 series · 3 of 3 positions shown · non-contrast
Comparison: 88159

CLINICAL DATA: Cough and abdominal pain for 2 days

EXAM:
DG ABDOMEN ACUTE W/ 1V CHEST

[chest pa]
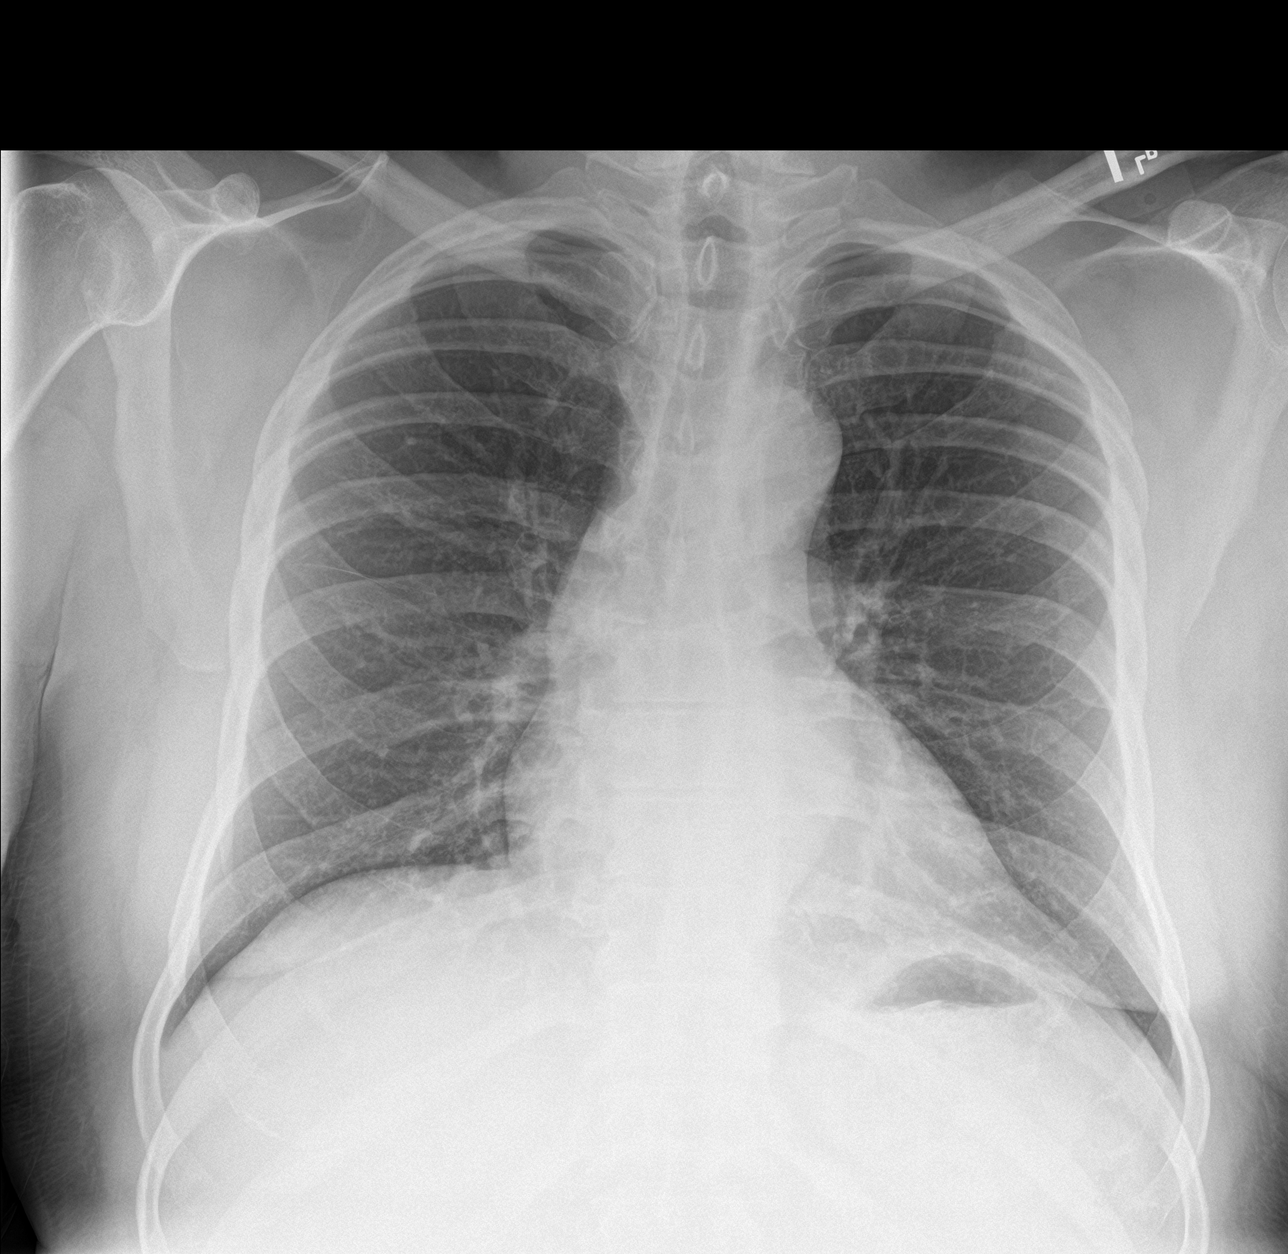

[abdomen erect]
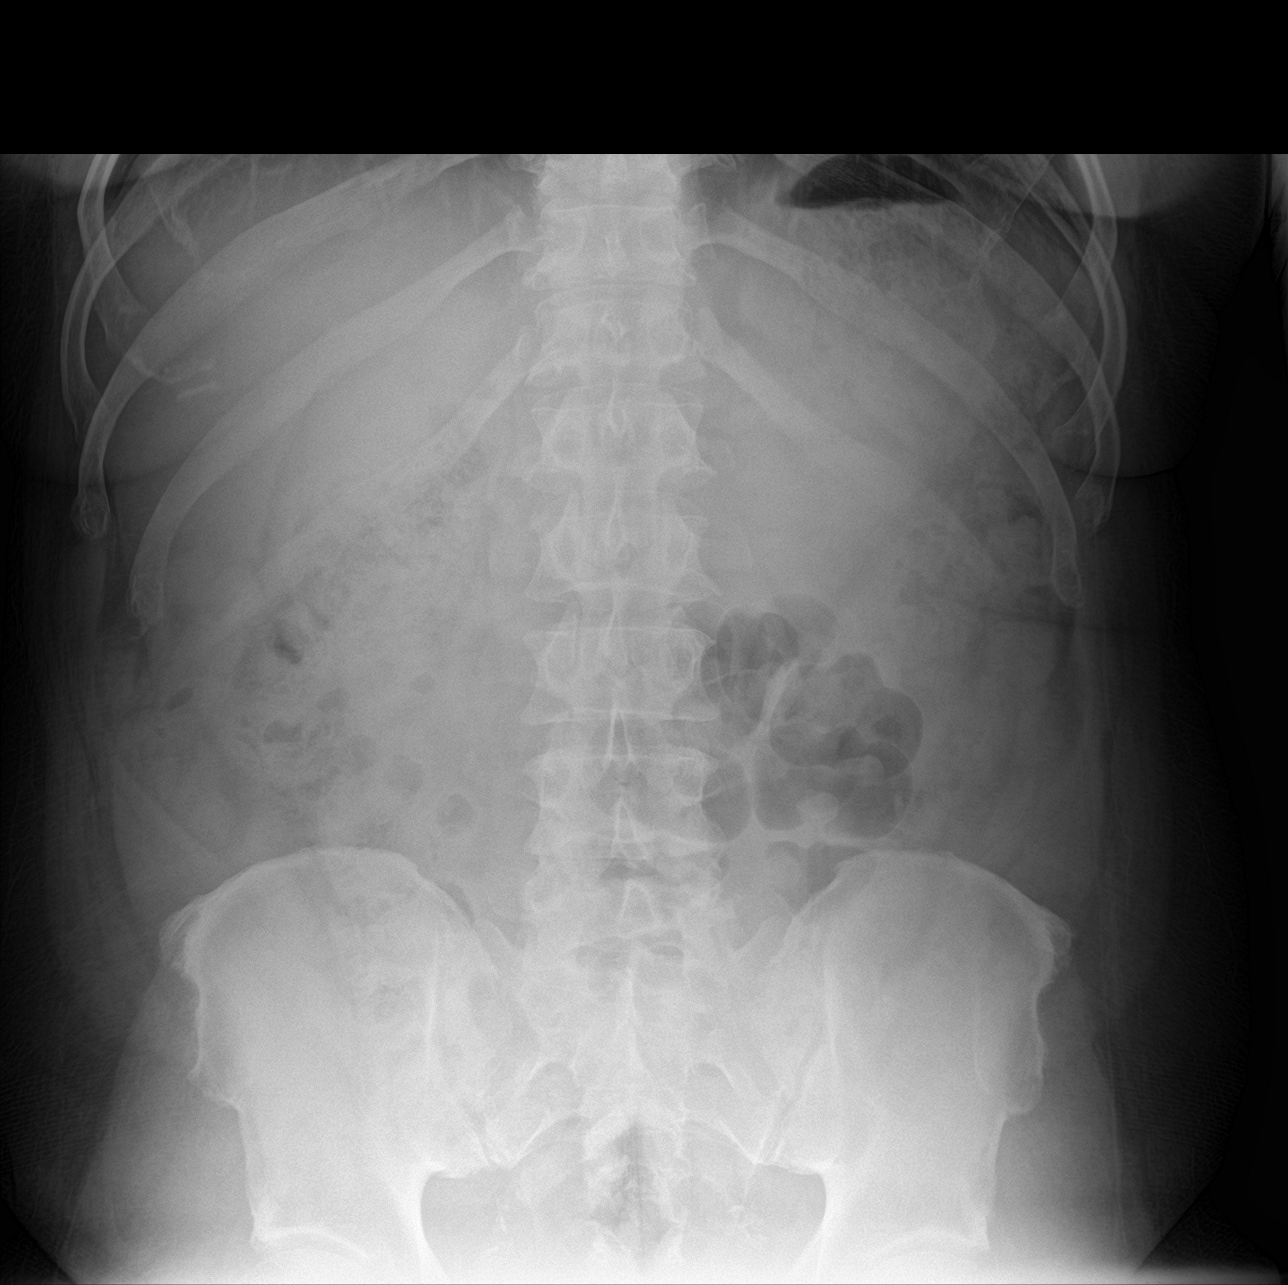

[abdomen supine]
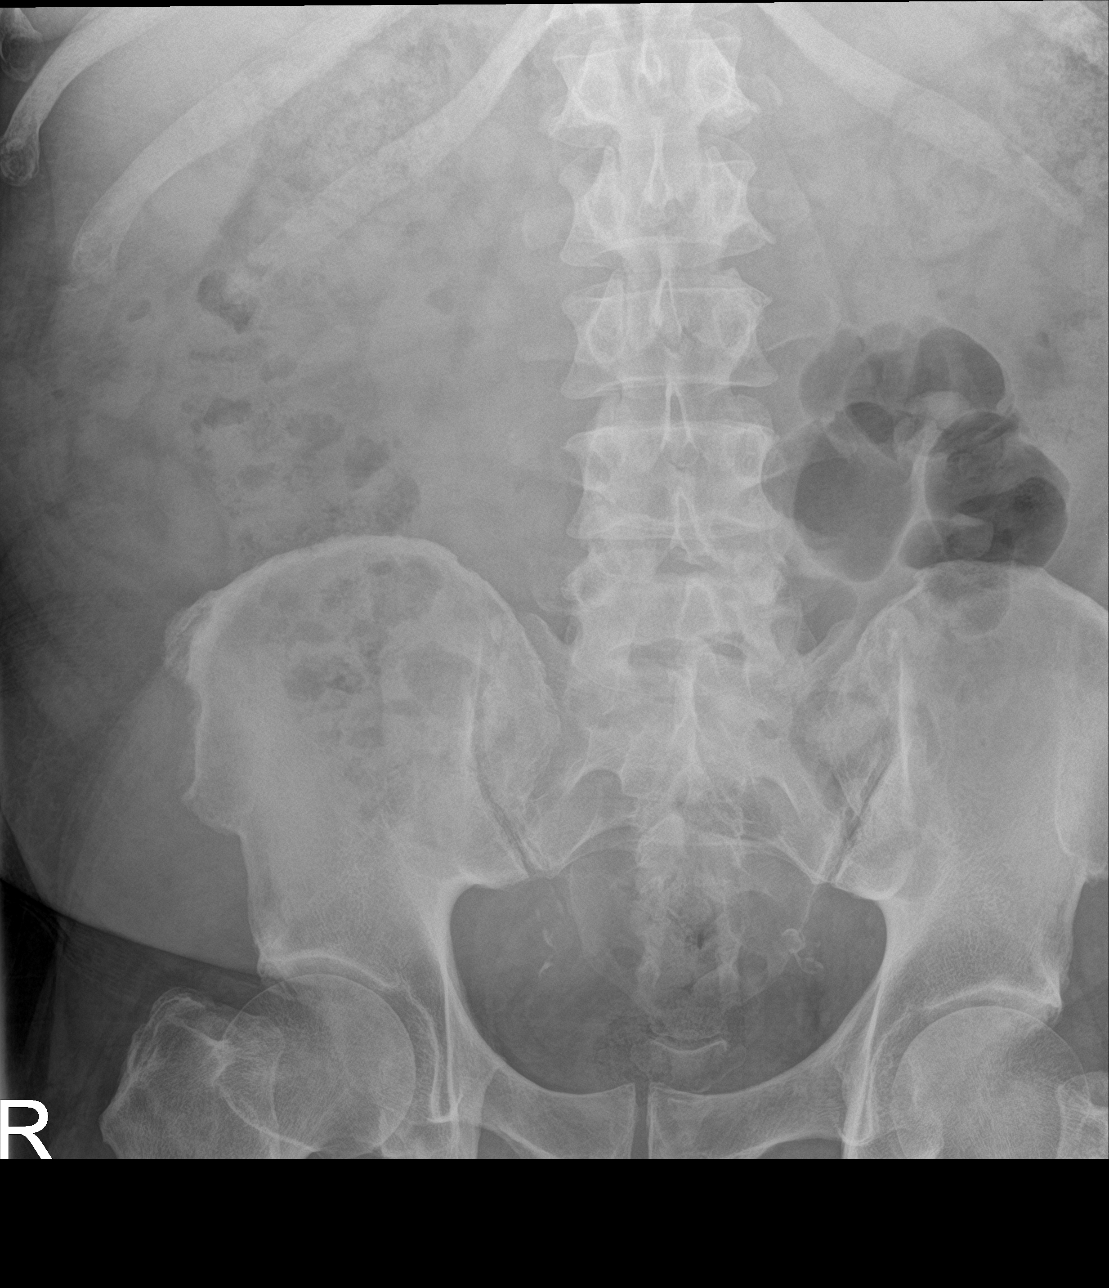

[3 of 3 positions shown; findings below may reference images not displayed]

FINDINGS: There is no evidence of dilated bowel loops or free intraperitoneal
air. No radiopaque calculi or other significant radiographic
abnormality is seen. Heart size and mediastinal contours are within
normal limits. Both lungs are clear.
IMPRESSION: Negative abdominal radiographs.  No acute cardiopulmonary disease.

## 2017-03-14 ENCOUNTER — Emergency Department (HOSPITAL_COMMUNITY): Payer: Medicare Other

## 2017-03-14 ENCOUNTER — Encounter (HOSPITAL_COMMUNITY): Payer: Self-pay

## 2017-03-14 DIAGNOSIS — E1165 Type 2 diabetes mellitus with hyperglycemia: Secondary | ICD-10-CM | POA: Diagnosis not present

## 2017-03-14 DIAGNOSIS — Z794 Long term (current) use of insulin: Secondary | ICD-10-CM

## 2017-03-14 DIAGNOSIS — M868X8 Other osteomyelitis, other site: Secondary | ICD-10-CM | POA: Diagnosis not present

## 2017-03-14 DIAGNOSIS — N179 Acute kidney failure, unspecified: Secondary | ICD-10-CM | POA: Diagnosis present

## 2017-03-14 DIAGNOSIS — A419 Sepsis, unspecified organism: Secondary | ICD-10-CM | POA: Diagnosis not present

## 2017-03-14 DIAGNOSIS — M25569 Pain in unspecified knee: Secondary | ICD-10-CM | POA: Diagnosis present

## 2017-03-14 DIAGNOSIS — Z882 Allergy status to sulfonamides status: Secondary | ICD-10-CM

## 2017-03-14 DIAGNOSIS — Z792 Long term (current) use of antibiotics: Secondary | ICD-10-CM

## 2017-03-14 DIAGNOSIS — N529 Male erectile dysfunction, unspecified: Secondary | ICD-10-CM | POA: Diagnosis present

## 2017-03-14 DIAGNOSIS — Z818 Family history of other mental and behavioral disorders: Secondary | ICD-10-CM

## 2017-03-14 DIAGNOSIS — L02611 Cutaneous abscess of right foot: Secondary | ICD-10-CM | POA: Diagnosis not present

## 2017-03-14 DIAGNOSIS — E669 Obesity, unspecified: Secondary | ICD-10-CM | POA: Diagnosis present

## 2017-03-14 DIAGNOSIS — E1122 Type 2 diabetes mellitus with diabetic chronic kidney disease: Secondary | ICD-10-CM | POA: Diagnosis present

## 2017-03-14 DIAGNOSIS — Z8261 Family history of arthritis: Secondary | ICD-10-CM

## 2017-03-14 DIAGNOSIS — D649 Anemia, unspecified: Secondary | ICD-10-CM | POA: Diagnosis present

## 2017-03-14 DIAGNOSIS — Z8249 Family history of ischemic heart disease and other diseases of the circulatory system: Secondary | ICD-10-CM

## 2017-03-14 DIAGNOSIS — E1169 Type 2 diabetes mellitus with other specified complication: Secondary | ICD-10-CM | POA: Diagnosis present

## 2017-03-14 DIAGNOSIS — L03115 Cellulitis of right lower limb: Secondary | ICD-10-CM | POA: Diagnosis not present

## 2017-03-14 DIAGNOSIS — E876 Hypokalemia: Secondary | ICD-10-CM | POA: Diagnosis present

## 2017-03-14 DIAGNOSIS — Z23 Encounter for immunization: Secondary | ICD-10-CM

## 2017-03-14 DIAGNOSIS — B951 Streptococcus, group B, as the cause of diseases classified elsewhere: Secondary | ICD-10-CM | POA: Diagnosis present

## 2017-03-14 DIAGNOSIS — E1140 Type 2 diabetes mellitus with diabetic neuropathy, unspecified: Secondary | ICD-10-CM | POA: Diagnosis present

## 2017-03-14 DIAGNOSIS — R739 Hyperglycemia, unspecified: Secondary | ICD-10-CM | POA: Diagnosis not present

## 2017-03-14 DIAGNOSIS — L97519 Non-pressure chronic ulcer of other part of right foot with unspecified severity: Secondary | ICD-10-CM | POA: Diagnosis present

## 2017-03-14 DIAGNOSIS — G8929 Other chronic pain: Secondary | ICD-10-CM | POA: Diagnosis present

## 2017-03-14 DIAGNOSIS — E11621 Type 2 diabetes mellitus with foot ulcer: Secondary | ICD-10-CM | POA: Diagnosis present

## 2017-03-14 DIAGNOSIS — B20 Human immunodeficiency virus [HIV] disease: Secondary | ICD-10-CM | POA: Diagnosis not present

## 2017-03-14 DIAGNOSIS — M7989 Other specified soft tissue disorders: Secondary | ICD-10-CM | POA: Diagnosis not present

## 2017-03-14 DIAGNOSIS — E11628 Type 2 diabetes mellitus with other skin complications: Secondary | ICD-10-CM | POA: Diagnosis present

## 2017-03-14 DIAGNOSIS — N183 Chronic kidney disease, stage 3 (moderate): Secondary | ICD-10-CM | POA: Diagnosis present

## 2017-03-14 DIAGNOSIS — M79671 Pain in right foot: Secondary | ICD-10-CM | POA: Diagnosis present

## 2017-03-14 DIAGNOSIS — Z79899 Other long term (current) drug therapy: Secondary | ICD-10-CM

## 2017-03-14 DIAGNOSIS — Z1611 Resistance to penicillins: Secondary | ICD-10-CM | POA: Diagnosis present

## 2017-03-14 DIAGNOSIS — I129 Hypertensive chronic kidney disease with stage 1 through stage 4 chronic kidney disease, or unspecified chronic kidney disease: Secondary | ICD-10-CM | POA: Diagnosis present

## 2017-03-14 DIAGNOSIS — Z79891 Long term (current) use of opiate analgesic: Secondary | ICD-10-CM

## 2017-03-14 DIAGNOSIS — B9561 Methicillin susceptible Staphylococcus aureus infection as the cause of diseases classified elsewhere: Secondary | ICD-10-CM | POA: Diagnosis present

## 2017-03-14 DIAGNOSIS — R509 Fever, unspecified: Secondary | ICD-10-CM | POA: Diagnosis not present

## 2017-03-14 DIAGNOSIS — Z6838 Body mass index (BMI) 38.0-38.9, adult: Secondary | ICD-10-CM

## 2017-03-14 NOTE — ED Triage Notes (Signed)
Pt has diabetic foot ulcer on r foot that has been there for a while with drainage, no fevers.

## 2017-03-15 ENCOUNTER — Encounter (HOSPITAL_COMMUNITY): Payer: Self-pay | Admitting: Internal Medicine

## 2017-03-15 ENCOUNTER — Inpatient Hospital Stay (HOSPITAL_COMMUNITY): Payer: Medicare Other

## 2017-03-15 ENCOUNTER — Inpatient Hospital Stay (HOSPITAL_COMMUNITY)
Admission: EM | Admit: 2017-03-15 | Discharge: 2017-03-24 | DRG: 970 | Disposition: A | Discharge status: Skilled Nursing Facility | Payer: Medicare Other | Attending: Internal Medicine | Admitting: Internal Medicine

## 2017-03-15 ENCOUNTER — Other Ambulatory Visit: Payer: Self-pay

## 2017-03-15 DIAGNOSIS — Z8261 Family history of arthritis: Secondary | ICD-10-CM

## 2017-03-15 DIAGNOSIS — E1169 Type 2 diabetes mellitus with other specified complication: Secondary | ICD-10-CM | POA: Diagnosis not present

## 2017-03-15 DIAGNOSIS — R509 Fever, unspecified: Secondary | ICD-10-CM

## 2017-03-15 DIAGNOSIS — M869 Osteomyelitis, unspecified: Secondary | ICD-10-CM | POA: Diagnosis not present

## 2017-03-15 DIAGNOSIS — R600 Localized edema: Secondary | ICD-10-CM | POA: Diagnosis not present

## 2017-03-15 DIAGNOSIS — E1122 Type 2 diabetes mellitus with diabetic chronic kidney disease: Secondary | ICD-10-CM | POA: Diagnosis not present

## 2017-03-15 DIAGNOSIS — N179 Acute kidney failure, unspecified: Secondary | ICD-10-CM | POA: Diagnosis not present

## 2017-03-15 DIAGNOSIS — Z1611 Resistance to penicillins: Secondary | ICD-10-CM | POA: Diagnosis not present

## 2017-03-15 DIAGNOSIS — Z794 Long term (current) use of insulin: Secondary | ICD-10-CM | POA: Diagnosis not present

## 2017-03-15 DIAGNOSIS — N184 Chronic kidney disease, stage 4 (severe): Secondary | ICD-10-CM | POA: Diagnosis present

## 2017-03-15 DIAGNOSIS — R739 Hyperglycemia, unspecified: Secondary | ICD-10-CM

## 2017-03-15 DIAGNOSIS — Z23 Encounter for immunization: Secondary | ICD-10-CM | POA: Diagnosis not present

## 2017-03-15 DIAGNOSIS — Z79899 Other long term (current) drug therapy: Secondary | ICD-10-CM | POA: Diagnosis not present

## 2017-03-15 DIAGNOSIS — L02611 Cutaneous abscess of right foot: Secondary | ICD-10-CM | POA: Diagnosis not present

## 2017-03-15 DIAGNOSIS — E876 Hypokalemia: Secondary | ICD-10-CM | POA: Diagnosis present

## 2017-03-15 DIAGNOSIS — S91301A Unspecified open wound, right foot, initial encounter: Secondary | ICD-10-CM | POA: Diagnosis not present

## 2017-03-15 DIAGNOSIS — B951 Streptococcus, group B, as the cause of diseases classified elsewhere: Secondary | ICD-10-CM | POA: Diagnosis not present

## 2017-03-15 DIAGNOSIS — R05 Cough: Secondary | ICD-10-CM

## 2017-03-15 DIAGNOSIS — L03115 Cellulitis of right lower limb: Secondary | ICD-10-CM | POA: Diagnosis not present

## 2017-03-15 DIAGNOSIS — D72829 Elevated white blood cell count, unspecified: Secondary | ICD-10-CM | POA: Diagnosis not present

## 2017-03-15 DIAGNOSIS — B20 Human immunodeficiency virus [HIV] disease: Secondary | ICD-10-CM | POA: Diagnosis not present

## 2017-03-15 DIAGNOSIS — E119 Type 2 diabetes mellitus without complications: Secondary | ICD-10-CM | POA: Diagnosis not present

## 2017-03-15 DIAGNOSIS — E1142 Type 2 diabetes mellitus with diabetic polyneuropathy: Secondary | ICD-10-CM | POA: Diagnosis not present

## 2017-03-15 DIAGNOSIS — N183 Chronic kidney disease, stage 3 unspecified: Secondary | ICD-10-CM | POA: Diagnosis present

## 2017-03-15 DIAGNOSIS — R059 Cough, unspecified: Secondary | ICD-10-CM

## 2017-03-15 DIAGNOSIS — I129 Hypertensive chronic kidney disease with stage 1 through stage 4 chronic kidney disease, or unspecified chronic kidney disease: Secondary | ICD-10-CM | POA: Diagnosis not present

## 2017-03-15 DIAGNOSIS — L8992 Pressure ulcer of unspecified site, stage 2: Secondary | ICD-10-CM | POA: Diagnosis not present

## 2017-03-15 DIAGNOSIS — Z4781 Encounter for orthopedic aftercare following surgical amputation: Secondary | ICD-10-CM | POA: Diagnosis not present

## 2017-03-15 DIAGNOSIS — Z8249 Family history of ischemic heart disease and other diseases of the circulatory system: Secondary | ICD-10-CM

## 2017-03-15 DIAGNOSIS — M86071 Acute hematogenous osteomyelitis, right ankle and foot: Secondary | ICD-10-CM | POA: Diagnosis not present

## 2017-03-15 DIAGNOSIS — M868X8 Other osteomyelitis, other site: Secondary | ICD-10-CM | POA: Diagnosis not present

## 2017-03-15 DIAGNOSIS — I10 Essential (primary) hypertension: Secondary | ICD-10-CM | POA: Diagnosis present

## 2017-03-15 DIAGNOSIS — D649 Anemia, unspecified: Secondary | ICD-10-CM | POA: Diagnosis present

## 2017-03-15 DIAGNOSIS — E785 Hyperlipidemia, unspecified: Secondary | ICD-10-CM | POA: Diagnosis not present

## 2017-03-15 DIAGNOSIS — B9561 Methicillin susceptible Staphylococcus aureus infection as the cause of diseases classified elsewhere: Secondary | ICD-10-CM | POA: Diagnosis not present

## 2017-03-15 DIAGNOSIS — Z882 Allergy status to sulfonamides status: Secondary | ICD-10-CM | POA: Diagnosis not present

## 2017-03-15 DIAGNOSIS — E11621 Type 2 diabetes mellitus with foot ulcer: Secondary | ICD-10-CM | POA: Diagnosis not present

## 2017-03-15 DIAGNOSIS — N189 Chronic kidney disease, unspecified: Secondary | ICD-10-CM | POA: Diagnosis present

## 2017-03-15 DIAGNOSIS — E1140 Type 2 diabetes mellitus with diabetic neuropathy, unspecified: Secondary | ICD-10-CM | POA: Diagnosis present

## 2017-03-15 DIAGNOSIS — A419 Sepsis, unspecified organism: Secondary | ICD-10-CM | POA: Diagnosis not present

## 2017-03-15 DIAGNOSIS — M86171 Other acute osteomyelitis, right ankle and foot: Secondary | ICD-10-CM | POA: Diagnosis not present

## 2017-03-15 DIAGNOSIS — S91309A Unspecified open wound, unspecified foot, initial encounter: Secondary | ICD-10-CM | POA: Diagnosis not present

## 2017-03-15 DIAGNOSIS — M8620 Subacute osteomyelitis, unspecified site: Secondary | ICD-10-CM | POA: Diagnosis not present

## 2017-03-15 DIAGNOSIS — E11628 Type 2 diabetes mellitus with other skin complications: Secondary | ICD-10-CM | POA: Diagnosis not present

## 2017-03-15 DIAGNOSIS — R051 Acute cough: Secondary | ICD-10-CM

## 2017-03-15 DIAGNOSIS — A4101 Sepsis due to Methicillin susceptible Staphylococcus aureus: Secondary | ICD-10-CM | POA: Diagnosis not present

## 2017-03-15 DIAGNOSIS — L0291 Cutaneous abscess, unspecified: Secondary | ICD-10-CM | POA: Diagnosis not present

## 2017-03-15 DIAGNOSIS — E1165 Type 2 diabetes mellitus with hyperglycemia: Secondary | ICD-10-CM | POA: Diagnosis present

## 2017-03-15 DIAGNOSIS — L97519 Non-pressure chronic ulcer of other part of right foot with unspecified severity: Secondary | ICD-10-CM | POA: Diagnosis present

## 2017-03-15 DIAGNOSIS — Z818 Family history of other mental and behavioral disorders: Secondary | ICD-10-CM | POA: Diagnosis not present

## 2017-03-15 LAB — CBC WITH DIFFERENTIAL/PLATELET
Basophils Absolute: 0 10*3/uL (ref 0.0–0.1)
Basophils Relative: 0 %
Eosinophils Absolute: 0 10*3/uL (ref 0.0–0.7)
Eosinophils Relative: 0 %
HCT: 29.7 % — ABNORMAL LOW (ref 39.0–52.0)
Hemoglobin: 9.9 g/dL — ABNORMAL LOW (ref 13.0–17.0)
Lymphocytes Relative: 7 %
Lymphs Abs: 1.5 10*3/uL (ref 0.7–4.0)
MCH: 28.3 pg (ref 26.0–34.0)
MCHC: 33.3 g/dL (ref 30.0–36.0)
MCV: 84.9 fL (ref 78.0–100.0)
Monocytes Absolute: 1.5 10*3/uL — ABNORMAL HIGH (ref 0.1–1.0)
Monocytes Relative: 7 %
Neutro Abs: 18.2 10*3/uL — ABNORMAL HIGH (ref 1.7–7.7)
Neutrophils Relative %: 86 %
Platelets: 252 10*3/uL (ref 150–400)
RBC: 3.5 MIL/uL — ABNORMAL LOW (ref 4.22–5.81)
RDW: 12.9 % (ref 11.5–15.5)
WBC: 21.2 10*3/uL — ABNORMAL HIGH (ref 4.0–10.5)

## 2017-03-15 LAB — GLUCOSE, CAPILLARY
Glucose-Capillary: 235 mg/dL — ABNORMAL HIGH (ref 65–99)
Glucose-Capillary: 258 mg/dL — ABNORMAL HIGH (ref 65–99)
Glucose-Capillary: 138 mg/dL — ABNORMAL HIGH (ref 65–99)

## 2017-03-15 LAB — BASIC METABOLIC PANEL
Anion gap: 13 (ref 5–15)
Anion gap: 9 (ref 5–15)
BUN: 24 mg/dL — ABNORMAL HIGH (ref 6–20)
BUN: 23 mg/dL — ABNORMAL HIGH (ref 6–20)
CO2: 22 mmol/L (ref 22–32)
CO2: 23 mmol/L (ref 22–32)
Calcium: 8.9 mg/dL (ref 8.9–10.3)
Calcium: 8.2 mg/dL — ABNORMAL LOW (ref 8.9–10.3)
Chloride: 91 mmol/L — ABNORMAL LOW (ref 101–111)
Chloride: 97 mmol/L — ABNORMAL LOW (ref 101–111)
Creatinine, Ser: 2.11 mg/dL — ABNORMAL HIGH (ref 0.61–1.24)
Creatinine, Ser: 1.91 mg/dL — ABNORMAL HIGH (ref 0.61–1.24)
GFR calc Af Amer: 40 mL/min — ABNORMAL LOW (ref >60)
GFR calc Af Amer: 45 mL/min — ABNORMAL LOW (ref >60)
GFR calc non Af Amer: 34 mL/min — ABNORMAL LOW (ref >60)
GFR calc non Af Amer: 39 mL/min — ABNORMAL LOW (ref >60)
Glucose, Bld: 500 mg/dL — ABNORMAL HIGH (ref 65–99)
Glucose, Bld: 360 mg/dL — ABNORMAL HIGH (ref 65–99)
Potassium: 4.2 mmol/L (ref 3.5–5.1)
Potassium: 3.6 mmol/L (ref 3.5–5.1)
Sodium: 126 mmol/L — ABNORMAL LOW (ref 135–145)
Sodium: 129 mmol/L — ABNORMAL LOW (ref 135–145)

## 2017-03-15 LAB — CBG MONITORING, ED
Glucose-Capillary: 361 mg/dL — ABNORMAL HIGH (ref 65–99)
Glucose-Capillary: 348 mg/dL — ABNORMAL HIGH (ref 65–99)
Glucose-Capillary: 365 mg/dL — ABNORMAL HIGH (ref 65–99)

## 2017-03-15 LAB — SEDIMENTATION RATE: Sed Rate: 127 mm/hr — ABNORMAL HIGH (ref 0–16)

## 2017-03-15 LAB — MRSA PCR SCREENING: MRSA by PCR: NEGATIVE

## 2017-03-15 LAB — PREALBUMIN: Prealbumin: 7.4 mg/dL — ABNORMAL LOW (ref 18–38)

## 2017-03-15 LAB — C-REACTIVE PROTEIN: CRP: 23.1 mg/dL — ABNORMAL HIGH (ref <1.0)

## 2017-03-15 LAB — HEMOGLOBIN A1C
Hgb A1c MFr Bld: 10.1 % — ABNORMAL HIGH (ref 4.8–5.6)
Mean Plasma Glucose: 243.17 mg/dL

## 2017-03-15 LAB — I-STAT CG4 LACTIC ACID, ED: Lactic Acid, Venous: 1.23 mmol/L (ref 0.5–1.9)

## 2017-03-15 MED ORDER — ACETAMINOPHEN 500 MG PO TABS
1000.0000 mg | ORAL_TABLET | Freq: Once | ORAL | Status: AC
  Administered 2017-03-15: 1000 mg via ORAL
  Filled 2017-03-15: qty 2

## 2017-03-15 MED ORDER — INSULIN ASPART 100 UNIT/ML ~~LOC~~ SOLN
0.0000 [IU] | Freq: Three times a day (TID) | SUBCUTANEOUS | Status: DC
  Administered 2017-03-15: 5 [IU] via SUBCUTANEOUS
  Administered 2017-03-15: 15 [IU] via SUBCUTANEOUS
  Administered 2017-03-15: 8 [IU] via SUBCUTANEOUS
  Administered 2017-03-16: 5 [IU] via SUBCUTANEOUS
  Administered 2017-03-16 (×2): 3 [IU] via SUBCUTANEOUS
  Filled 2017-03-15: qty 1

## 2017-03-15 MED ORDER — INSULIN ASPART 100 UNIT/ML ~~LOC~~ SOLN
5.0000 [IU] | Freq: Three times a day (TID) | SUBCUTANEOUS | Status: DC
  Administered 2017-03-15 – 2017-03-23 (×20): 5 [IU] via SUBCUTANEOUS

## 2017-03-15 MED ORDER — SODIUM CHLORIDE 0.9 % IV BOLUS (SEPSIS)
1000.0000 mL | Freq: Once | INTRAVENOUS | Status: AC
  Administered 2017-03-15: 1000 mL via INTRAVENOUS

## 2017-03-15 MED ORDER — SODIUM CHLORIDE 0.9 % IV SOLN: 1500.0000 mg | INTRAVENOUS | Status: DC

## 2017-03-15 MED ORDER — INSULIN GLARGINE 100 UNIT/ML ~~LOC~~ SOLN
20.0000 [IU] | Freq: Every day | SUBCUTANEOUS | Status: DC
  Administered 2017-03-15: 20 [IU] via SUBCUTANEOUS
  Filled 2017-03-15: qty 0.2

## 2017-03-15 MED ORDER — INSULIN GLARGINE 100 UNIT/ML ~~LOC~~ SOLN
25.0000 [IU] | Freq: Every day | SUBCUTANEOUS | Status: DC
  Administered 2017-03-16: 25 [IU] via SUBCUTANEOUS
  Filled 2017-03-15 (×2): qty 0.25

## 2017-03-15 MED ORDER — PNEUMOCOCCAL VAC POLYVALENT 25 MCG/0.5ML IJ INJ
0.5000 mL | INJECTION | INTRAMUSCULAR | Status: AC
  Administered 2017-03-16: 0.5 mL via INTRAMUSCULAR
  Filled 2017-03-15: qty 0.5

## 2017-03-15 MED ORDER — VANCOMYCIN HCL 500 MG IV SOLR
500.0000 mg | Freq: Once | INTRAVENOUS | Status: DC
  Administered 2017-03-15: 500 mg via INTRAVENOUS
  Filled 2017-03-15: qty 500

## 2017-03-15 MED ORDER — ABACAVIR-DOLUTEGRAVIR-LAMIVUD 600-50-300 MG PO TABS
1.0000 | ORAL_TABLET | Freq: Every day | ORAL | Status: DC
  Administered 2017-03-15 – 2017-03-24 (×10): 1 via ORAL
  Filled 2017-03-15 (×11): qty 1

## 2017-03-15 MED ORDER — LORAZEPAM 1 MG PO TABS: 1.0000 mg | ORAL_TABLET | Freq: Four times a day (QID) | ORAL | Status: DC | PRN

## 2017-03-15 MED ORDER — OXYCODONE HCL 5 MG PO TABS
10.0000 mg | ORAL_TABLET | Freq: Three times a day (TID) | ORAL | Status: AC | PRN
  Administered 2017-03-15 – 2017-03-20 (×5): 10 mg via ORAL
  Filled 2017-03-15 (×6): qty 2

## 2017-03-15 MED ORDER — VANCOMYCIN HCL IN DEXTROSE 1-5 GM/200ML-% IV SOLN
1000.0000 mg | Freq: Once | INTRAVENOUS | Status: AC
  Administered 2017-03-15: 1000 mg via INTRAVENOUS
  Filled 2017-03-15: qty 200

## 2017-03-15 MED ORDER — LINEZOLID 600 MG PO TABS
600.0000 mg | ORAL_TABLET | Freq: Once | ORAL | Status: AC
  Administered 2017-03-15: 600 mg via ORAL
  Filled 2017-03-15: qty 1

## 2017-03-15 MED ORDER — AMLODIPINE BESYLATE 10 MG PO TABS
10.0000 mg | ORAL_TABLET | Freq: Every day | ORAL | Status: DC
  Administered 2017-03-15 – 2017-03-24 (×10): 10 mg via ORAL
  Filled 2017-03-15 (×6): qty 1
  Filled 2017-03-15: qty 2
  Filled 2017-03-15 (×3): qty 1

## 2017-03-15 MED ORDER — PIPERACILLIN-TAZOBACTAM 3.375 G IVPB 30 MIN
3.3750 g | Freq: Once | INTRAVENOUS | Status: AC
  Administered 2017-03-15: 3.375 g via INTRAVENOUS
  Filled 2017-03-15: qty 50

## 2017-03-15 MED ORDER — CYCLOBENZAPRINE HCL 10 MG PO TABS
10.0000 mg | ORAL_TABLET | Freq: Two times a day (BID) | ORAL | Status: DC | PRN
  Filled 2017-03-15: qty 1

## 2017-03-15 MED ORDER — DARUNAVIR-COBICISTAT 800-150 MG PO TABS
1.0000 | ORAL_TABLET | Freq: Every day | ORAL | Status: DC
  Administered 2017-03-15 – 2017-03-24 (×9): 1 via ORAL
  Filled 2017-03-15 (×12): qty 1

## 2017-03-15 MED ORDER — INFLUENZA VAC SPLIT QUAD 0.5 ML IM SUSY
0.5000 mL | PREFILLED_SYRINGE | INTRAMUSCULAR | Status: AC
  Administered 2017-03-16: 0.5 mL via INTRAMUSCULAR
  Filled 2017-03-15: qty 0.5

## 2017-03-15 MED ORDER — MORPHINE SULFATE (PF) 4 MG/ML IV SOLN
4.0000 mg | Freq: Once | INTRAVENOUS | Status: AC
  Administered 2017-03-15: 4 mg via INTRAVENOUS
  Filled 2017-03-15: qty 1

## 2017-03-15 MED ORDER — SODIUM CHLORIDE 0.9 % IV SOLN
INTRAVENOUS | Status: DC
  Filled 2017-03-15: qty 1

## 2017-03-15 MED ORDER — PIPERACILLIN-TAZOBACTAM 3.375 G IVPB
3.3750 g | Freq: Three times a day (TID) | INTRAVENOUS | Status: DC
  Administered 2017-03-15 – 2017-03-19 (×13): 3.375 g via INTRAVENOUS
  Filled 2017-03-15 (×13): qty 50

## 2017-03-15 MED ORDER — ONDANSETRON HCL 4 MG/2ML IJ SOLN
4.0000 mg | Freq: Four times a day (QID) | INTRAMUSCULAR | Status: DC | PRN
  Filled 2017-03-15: qty 2

## 2017-03-15 MED ORDER — SODIUM CHLORIDE 0.9 % IV SOLN
INTRAVENOUS | Status: DC
  Administered 2017-03-15 – 2017-03-16 (×2): via INTRAVENOUS

## 2017-03-15 MED ORDER — SODIUM CHLORIDE 0.9 % IV SOLN
8.0000 mg/kg | INTRAVENOUS | Status: DC
  Administered 2017-03-15 – 2017-03-16 (×2): 871 mg via INTRAVENOUS
  Filled 2017-03-15 (×3): qty 17.42

## 2017-03-15 MED ORDER — SODIUM CHLORIDE 0.9 % IV SOLN
INTRAVENOUS | Status: DC
  Administered 2017-03-15: 125 mL/h via INTRAVENOUS

## 2017-03-15 MED ORDER — ACETAMINOPHEN 325 MG PO TABS
650.0000 mg | ORAL_TABLET | Freq: Four times a day (QID) | ORAL | Status: DC | PRN
  Administered 2017-03-15 – 2017-03-16 (×3): 650 mg via ORAL
  Filled 2017-03-15 (×4): qty 2

## 2017-03-15 MED ORDER — ONDANSETRON HCL 4 MG/2ML IJ SOLN
4.0000 mg | Freq: Once | INTRAMUSCULAR | Status: AC
  Administered 2017-03-15: 4 mg via INTRAVENOUS
  Filled 2017-03-15: qty 2

## 2017-03-15 MED ORDER — ONDANSETRON HCL 4 MG PO TABS
4.0000 mg | ORAL_TABLET | Freq: Four times a day (QID) | ORAL | Status: DC | PRN
  Administered 2017-03-17: 4 mg via ORAL
  Filled 2017-03-15: qty 1

## 2017-03-15 MED ORDER — ACETAMINOPHEN 650 MG RE SUPP: 650.0000 mg | Freq: Four times a day (QID) | RECTAL | Status: DC | PRN

## 2017-03-15 MED ORDER — INSULIN ASPART 100 UNIT/ML ~~LOC~~ SOLN
10.0000 [IU] | Freq: Once | SUBCUTANEOUS | Status: AC
  Administered 2017-03-15: 10 [IU] via INTRAVENOUS
  Filled 2017-03-15: qty 1

## 2017-03-15 NOTE — ED Notes (Signed)
Called MRI re: scan, pt updated on estimated wait time of 1 hr before being transported over for MRI

## 2017-03-15 NOTE — Progress Notes (Addendum)
  PROGRESS NOTE  Patient admitted earlier this morning. See H&P. Keith [name] is a 51 y.o. male with history of HIV, diabetes mellitus type 2, hypertension presents to the ER with complaints of increasing pain and swelling in the right foot for the last few days.  Patient also noticed increasing discharge from the foot ulcer. Patient states he has been compliant with his anti-retroviral's and Lantus dose. On exam, he has a first metatarsal ulcer on plantar surface as well as skin laceration on lateral edge of foot.   MRI right foot completed this afternoon showed first MTP joint osteomyelitis as well as abscess Check ABI  Orthopedic surgery consult, [name] has seen patient for right foot ulcer in 01/23/17  ID consult. Has hx of renal failure from use of vanco in the past per chart. Zyvox/zosyn for now.  Blood culture pending Wound culture pending  IVF  Increase insulin dose for better blood sugar control  Trend labs  Continue HIV meds    Dessa Phi, DO [facility] [email or web address] Password [email or web address] 03/15/2017, 2:50 PM

## 2017-03-15 NOTE — ED Provider Notes (Signed)
TIME SEEN: 3:27 AM  CHIEF COMPLAINT: Right foot pain  HPI: Patient is a 51 year old male with insulin-dependent diabetes, HIV (last CD4 200 and viral load nondetectable in February), hypertension who presents to the emergency department with a right foot ulcer.  States it has been there for "a while" over the past week it has begun to drain a foul-smelling purulent drainage and he has had significant swelling, warmth and tenderness of the foot.  No injury that he can recall.  No fevers.  He is not sure what his blood glucose has been running.  He states he has not been feeling well.  ROS: See HPI Constitutional: no fever  Eyes: no drainage  ENT: no runny nose   Cardiovascular:  no chest pain  Resp: no SOB  GI: no vomiting GU: no dysuria Integumentary: no rash  Allergy: no hives  Musculoskeletal: no leg swelling  Neurological: no slurred speech ROS otherwise negative  PAST MEDICAL HISTORY/PAST SURGICAL HISTORY:  Past Medical History:  Diagnosis Date  . AIDS ([place])   . Chronic knee pain    right  . Chronic pain   . Diabetes mellitus   . Diabetes type 2, uncontrolled ([place])    HgA1c 17.6 (04/27/2010)  . Diabetic foot ulcer ([place]) 01/2017   right foot  . Erectile dysfunction   . Genital warts   . HIV (human immunodeficiency virus infection) ([place]) 2009   CD4 count 100, VL 13800 (05/01/2010)  . Hypertension   . Osteomyelitis ([place])    h/o hand    MEDICATIONS:  Prior to Admission medications   Medication Sig Start Date End Date Taking? Authorizing Provider  abacavir-dolutegravir-lamiVUDine (TRIUMEQ) 600-50-300 MG tablet Take 1 tablet by mouth daily. 01/20/17   [name], MD  amLODipine (NORVASC) 10 MG tablet Take 1 tablet (10 mg total) by mouth daily. 01/17/17   Nita Sells, MD  cyclobenzaprine (FLEXERIL) 10 MG tablet Take 1 tablet (10 mg total) by mouth 2 (two) times daily as needed for muscle spasms. 10/25/16   Ashley Murrain, NP  darunavir-cobicistat (PREZCOBIX)  800-150 MG tablet Take 1 tablet by mouth daily. Swallow whole. Do NOT crush, break or chew tablets. Take with food. 01/20/17   [name], MD  doxycycline (VIBRA-TABS) 100 MG tablet Take 1 tablet (100 mg total) by mouth 2 (two) times daily. 01/09/17   [name], MD  doxycycline (VIBRA-TABS) 100 MG tablet Take 1 tablet (100 mg total) by mouth every 12 (twelve) hours. 01/16/17   Nita Sells, MD  glimepiride (AMARYL) 1 MG tablet Take 1 tablet (1 mg total) by mouth every morning. 01/16/17 01/16/18  Nita Sells, MD  Insulin Glargine (LANTUS SOLOSTAR) 100 UNIT/ML Solostar Pen Inject 20 Units into the skin daily at 10 pm. 01/16/17   Nita Sells, MD  Insulin Syringes, Disposable, U-100 0.3 ML MISC For administration of insulin TID. ICD E-11.9 10/29/16   Scot Jun, FNP  LORazepam (ATIVAN) 1 MG tablet Take 1 tablet (1 mg total) by mouth every 6 (six) hours as needed for anxiety. 01/30/15   [name], MD  naloxone [facility]) nasal spray 4 mg/0.1 mL Place 1 spray into the nose daily as needed (opioid overdose).    [provider]  Oxycodone HCl 10 MG TABS Take 1 tablet (10 mg total) by mouth every 8 (eight) hours as needed (moderate or severe pain). Patient taking differently: Take 10 mg by mouth 4 (four) times daily.  09/29/16   Nita Sells, MD  ALLERGIES:  Allergies  Allergen Reactions  . Sulfa Antibiotics Itching    SOCIAL HISTORY:  Social History   Tobacco Use  . Smoking status: Never Smoker  . Smokeless tobacco: Never Used  Substance Use Topics  . Alcohol use: No    Alcohol/week: 0.0 oz    FAMILY HISTORY: Family History  Problem Relation Age of Onset  . Hypertension Mother   . Arthritis Father   . Hypertension Father   . Hypertension Brother   . Cancer Maternal Grandmother 64       unknown type of cancer  . Depression Paternal Grandmother     EXAM: BP 116/66 (BP Location: Left Arm)   Pulse (!) 103   Temp 98  F (36.7 C) (Oral)   Resp 14   Ht 5\' 9"  (1.753 m)   Wt 108.9 kg (240 lb)   SpO2 96%   BMI 35.44 kg/m  CONSTITUTIONAL: Alert and oriented and responds appropriately to questions.  Obese, chronically ill-appearing HEAD: Normocephalic EYES: Conjunctivae clear, pupils appear equal, EOMI ENT: normal nose; moist mucous membranes NECK: Supple, no meningismus, no nuchal rigidity, no LAD  CARD: Regular and tachycardic; S1 and S2 appreciated; no murmurs, no clicks, no rubs, no gallops RESP: Normal chest excursion without splinting or tachypnea; breath sounds clear and equal bilaterally; no wheezes, no rhonchi, no rales, no hypoxia or respiratory distress, speaking full sentences ABD/GI: Normal bowel sounds; non-distended; soft, non-tender, no rebound, no guarding, no peritoneal signs, no hepatosplenomegaly BACK:  The back appears normal and is non-tender to palpation, there is no CVA tenderness EXT: Quarter size ulcer noted to the medial aspect of the bottom of the right foot that has a large amount of fluctuance erythema and warmth.  When you press around the ulcer there is a lot a foul-smelling purulent drainage that comes out.  2+ DP pulses bilaterally.  There is soft tissue swelling throughout the foot.  No joint effusion noted.  Normal ROM in all joints; otherwise extremities are non-tender to palpation; no edema; normal capillary refill; no cyanosis, no calf tenderness or swelling    SKIN: Normal color for age and race; warm; no rash NEURO: Moves all extremities equally PSYCH: The patient's mood and manner are appropriate. Grooming and personal hygiene are appropriate.  MEDICAL DECISION MAKING: Patient here with cellulitis.  He is tachycardic here but not febrile.  Will check rectal temperature.  Will obtain labs, culture.  I have sent a wound culture from his foot.  X-ray shows no bony destruction, fracture or dislocation.  Will give him broad-spectrum antibiotics and pain medication.  I feel he  will need admission.  States his PCP is Dr. [name].  ED PROGRESS: Patient has a leukocytosis.  He has hyperglycemia without DKA.  Will give IV fluids.  He is receiving broad-spectrum antibiotics.  He is febrile here.  Will give Tylenol.  Will give IV insulin.  Patient will need admission.    5:15 AM Discussed patient's case with hospitalist, Dr. Hal Hope.  I have recommended admission and patient (and family if present) agree with this plan. Admitting physician will place admission orders.   Hospitalist would like for me to start insulin drip on patient.  I reviewed all nursing notes, vitals, pertinent previous records, EKGs, lab and urine results, imaging (as available).     Date: 03/15/2017 4:53 AM  Rate: 107  Rhythm: sinus tachycardia  QRS Axis: normal  Intervals: normal  ST/T Wave abnormalities: normal  Conduction Disutrbances: none  Narrative  Interpretation: sinus tachycardia      CRITICAL CARE Performed by: Pryor Curia   Total critical care time: 45 minutes  Critical care time was exclusive of separately billable procedures and treating other patients.  Critical care was necessary to treat or prevent imminent or life-threatening deterioration.  Critical care was time spent personally by me on the following activities: development of treatment plan with patient and/or surrogate as well as nursing, discussions with consultants, evaluation of patient's response to treatment, examination of patient, obtaining history from patient or surrogate, ordering and performing treatments and interventions, ordering and review of laboratory studies, ordering and review of radiographic studies, pulse oximetry and re-evaluation of patient's condition.     William Schake, Delice Bison, DO 03/15/17 [phone]

## 2017-03-15 NOTE — Consult Note (Signed)
[place] Nurse wound consult note [place] Nurse simultaneously consulted with Orthopedics.  Dr. Louanne Skye has seen today and debrided wound. His noted indicate that he will follow and consult with [name] for an ongoing [place] since patient had been seen by [name] in the past and was lost to follow up.I did not take today's dressing down, did not see and will not follow.  [place] nursing team will not follow, but will remain available to this patient, the nursing and medical teams.  Please re-consult if needed. Thanks, Maudie Flakes, MSN, RN, [place], Arther Abbott  Pager# [phone]

## 2017-03-15 NOTE — ED Notes (Signed)
Heart Healthy/Carb Modified diet breakfast tray ordered @ 1020 via email.

## 2017-03-15 NOTE — Consult Note (Signed)
[facility] for Infectious Disease  Total days of antibiotics         Day 1 linezolid/piptazo         Reason for Consult: diabetic osteo of right foot  Referring Physician: choi  Principal Problem:   Sepsis ([place]) Active Problems:   Essential hypertension   Insulin-requiring or dependent type II diabetes mellitus ([place])   HIV disease ([place])   CKD (chronic kidney disease) stage 3, GFR 30-59 ml/min (HCC)    HPI: Keith [name] is a 51 y.o. male with well controlled HIV disease, CD 4 count of 210/VL<20, currently on triumeq-DRVc. Also has DM with DFU, and diabetic neuropathy.hgba1c of 10.6. Has had complaint of right foot ulcer since late September. Due to worsening pain and drainage to his wound, he came to the ED for evaluation. He was found to have WBC of 21K, 86^ N, sed rate of 127, pseudohyponatremia of Na 126 due to BS of 500. He was hospitalized in early October for his DFU, management where he was seen by dr duda. At that time, he was given 14 d of doxycycline and wound care instructions plus darco boot to offload. During that hospitalization he developed aki with cr going from 1 to 3.5 thought in part related to vancomycin. He saw dr duda in follow up in mid [place] but has not been back since due to missing appt and not rescheduling it. In the meantime, he has been off of abtx and wound having increasing drainage. Initially his wound was a shallow ulcer underlying the right great toe mtp but now has fluctuance to 1st and 2nd MT and his general foot is swollen and warm. His mri showes evidence of osteo to great toe but also a plantar abscess. Dr Louanne Skye performed bedside debridement of abscess. Cultures are pending from admit. He is was started on linezolid and piptazo  Patient also reports having dry cough x 7-10d. Feeling poorly, chills, but no overt fevers or nightsweats   Past Medical History:  Diagnosis Date  . AIDS ([place])   . Chronic knee pain    right  . Chronic pain   .  Diabetes mellitus   . Diabetes type 2, uncontrolled ([place])    HgA1c 17.6 (04/27/2010)  . Diabetic foot ulcer ([place]) 01/2017   right foot  . Erectile dysfunction   . Genital warts   . HIV (human immunodeficiency virus infection) ([place]) 2009   CD4 count 100, VL 13800 (05/01/2010)  . Hypertension   . Osteomyelitis ([place])    h/o hand    Allergies:  Allergies  Allergen Reactions  . Sulfa Antibiotics Itching     MEDICATIONS: . abacavir-dolutegravir-lamiVUDine  1 tablet Oral Daily  . amLODipine  10 mg Oral Daily  . darunavir-cobicistat  1 tablet Oral Daily  . [START ON 03/16/2017] Influenza vac split quadrivalent PF  0.5 mL Intramuscular Tomorrow-1000  . insulin aspart  0-15 Units Subcutaneous TID WC  . insulin aspart  5 Units Subcutaneous TID WC  . [START ON 03/16/2017] insulin glargine  25 Units Subcutaneous Daily  . [START ON 03/16/2017] pneumococcal 23 valent vaccine  0.5 mL Intramuscular Tomorrow-1000    Social History   Tobacco Use  . Smoking status: Never Smoker  . Smokeless tobacco: Never Used  Substance Use Topics  . Alcohol use: No    Alcohol/week: 0.0 oz  . Drug use: No    Family History  Problem Relation Age of Onset  . Hypertension Mother   .  Arthritis Father   . Hypertension Father   . Hypertension Brother   . Cancer Maternal Grandmother 58       unknown type of cancer  . Depression Paternal Grandmother      Review of Systems  Constitutional: Negative for fever, chills, diaphoresis, activity change, appetite change, fatigue and unexpected weight change.  HENT: Negative for congestion, sore throat, rhinorrhea, sneezing, trouble swallowing and sinus pressure.  Eyes: Negative for photophobia and visual disturbance.  Respiratory: positive for cough, negative chest tightness, shortness of breath, wheezing and stridor.  Cardiovascular: Negative for chest pain, palpitations and leg swelling.  Gastrointestinal: Negative for nausea, vomiting, abdominal pain,  diarrhea, constipation, blood in stool, abdominal distention and anal bleeding.  Genitourinary: Negative for dysuria, hematuria, flank pain and difficulty urinating.  Musculoskeletal: Negative for myalgias, back pain, joint swelling, arthralgias and gait problem.  Skin: positive for right foot wound, and swelling. Negative for color change, pallor, rash and wound.  Neurological: Negative for dizziness, tremors, weakness and light-headedness.  Hematological: Negative for adenopathy. Does not bruise/bleed easily.  Psychiatric/Behavioral: Negative for behavioral problems, confusion, sleep disturbance, dysphoric mood, decreased concentration and agitation.     OBJECTIVE: Temp:  [98 F (36.7 C)-101 F (38.3 C)] 98.5 F (36.9 C) (12/08 1458) Pulse Rate:  [77-113] 94 (12/08 1458) Resp:  [0-24] 21 (12/08 1458) BP: (93-155)/(63-98) 152/67 (12/08 1458) SpO2:  [95 %-100 %] 99 % (12/08 1458) Weight:  [240 lb (108.9 kg)] 240 lb (108.9 kg) (12/07 2123) Physical Exam  Constitutional: He is oriented to person, place, and time. He appears well-developed and well-nourished. No distress.  HENT:  Mouth/Throat: Oropharynx is clear and moist. No oropharyngeal exudate.  Cardiovascular: Normal rate, regular rhythm and normal heart sounds. Exam reveals no gallop and no friction rub.  No murmur heard.  Pulmonary/Chest: Effort normal and breath sounds normal. No respiratory distress. He has no wheezes.  Abdominal: Soft. Bowel sounds are normal. He exhibits no distension. There is no tenderness.  Lymphadenopathy:  He has no cervical adenopathy.  Neurological: He is alert and oriented to person, place, and time.  Skin: right foot wound is wrapped, swelling to right dorsum, both legs are extremely dry, flaky. Onychomycosis of toenails. Psychiatric: He has a normal mood and affect. His behavior is normal.     LABS: Results for orders placed or performed during the hospital encounter of 03/15/17 (from the past  48 hour(s))  Wound or Superficial Culture     Status: None (Preliminary result)   Collection Time: 03/15/17  3:28 AM  Result Value Ref Range   Specimen Description WOUND RIGHT FOOT    Special Requests NONE    Gram Stain      ABUNDANT WBC PRESENT, PREDOMINANTLY PMN ABUNDANT GRAM NEGATIVE RODS ABUNDANT GRAM POSITIVE COCCI    Culture PENDING    Report Status PENDING   CBC with Differential     Status: Abnormal   Collection Time: 03/15/17  3:48 AM  Result Value Ref Range   WBC 21.2 (H) 4.0 - 10.5 K/uL   RBC 3.50 (L) 4.22 - 5.81 MIL/uL   Hemoglobin 9.9 (L) 13.0 - 17.0 g/dL   HCT 29.7 (L) 39.0 - 52.0 %   MCV 84.9 78.0 - 100.0 fL   MCH 28.3 26.0 - 34.0 pg   MCHC 33.3 30.0 - 36.0 g/dL   RDW 12.9 11.5 - 15.5 %   Platelets 252 150 - 400 K/uL   Neutrophils Relative % 86 %   Lymphocytes Relative 7 %  Monocytes Relative 7 %   Eosinophils Relative 0 %   Basophils Relative 0 %   Neutro Abs 18.2 (H) 1.7 - 7.7 K/uL   Lymphs Abs 1.5 0.7 - 4.0 K/uL   Monocytes Absolute 1.5 (H) 0.1 - 1.0 K/uL   Eosinophils Absolute 0.0 0.0 - 0.7 K/uL   Basophils Absolute 0.0 0.0 - 0.1 K/uL   RBC Morphology POLYCHROMASIA PRESENT    WBC Morphology VACUOLATED NEUTROPHILS   Basic metabolic panel     Status: Abnormal   Collection Time: 03/15/17  3:48 AM  Result Value Ref Range   Sodium 126 (L) 135 - 145 mmol/L   Potassium 4.2 3.5 - 5.1 mmol/L   Chloride 91 (L) 101 - 111 mmol/L   CO2 22 22 - 32 mmol/L   Glucose, Bld 500 (H) 65 - 99 mg/dL   BUN 24 (H) 6 - 20 mg/dL   Creatinine, Ser 2.11 (H) 0.61 - 1.24 mg/dL   Calcium 8.9 8.9 - 10.3 mg/dL   GFR calc non Af Amer 34 (L) >60 mL/min   GFR calc Af Amer 40 (L) >60 mL/min    Comment: (NOTE) The eGFR has been calculated using the CKD EPI equation. This calculation has not been validated in all clinical situations. eGFR's persistently <60 mL/min signify possible Chronic Kidney Disease.    Anion gap 13 5 - 15  I-Stat CG4 Lactic Acid, ED     Status: None    Collection Time: 03/15/17  4:00 AM  Result Value Ref Range   Lactic Acid, Venous 1.23 0.5 - 1.9 mmol/L  CBG monitoring, ED     Status: Abnormal   Collection Time: 03/15/17  5:36 AM  Result Value Ref Range   Glucose-Capillary 361 (H) 65 - 99 mg/dL  CBG monitoring, ED     Status: Abnormal   Collection Time: 03/15/17  7:04 AM  Result Value Ref Range   Glucose-Capillary 348 (H) 65 - 99 mg/dL   Comment 1 Notify RN    Comment 2 Document in Chart   CBG monitoring, ED     Status: Abnormal   Collection Time: 03/15/17  8:25 AM  Result Value Ref Range   Glucose-Capillary 365 (H) 65 - 99 mg/dL   Comment 1 Notify RN    Comment 2 Document in Chart   Basic metabolic panel     Status: Abnormal   Collection Time: 03/15/17 10:08 AM  Result Value Ref Range   Sodium 129 (L) 135 - 145 mmol/L   Potassium 3.6 3.5 - 5.1 mmol/L   Chloride 97 (L) 101 - 111 mmol/L   CO2 23 22 - 32 mmol/L   Glucose, Bld 360 (H) 65 - 99 mg/dL   BUN 23 (H) 6 - 20 mg/dL   Creatinine, Ser 1.91 (H) 0.61 - 1.24 mg/dL   Calcium 8.2 (L) 8.9 - 10.3 mg/dL   GFR calc non Af Amer 39 (L) >60 mL/min   GFR calc Af Amer 45 (L) >60 mL/min    Comment: (NOTE) The eGFR has been calculated using the CKD EPI equation. This calculation has not been validated in all clinical situations. eGFR's persistently <60 mL/min signify possible Chronic Kidney Disease.    Anion gap 9 5 - 15  MRSA PCR Screening     Status: None   Collection Time: 03/15/17  2:45 PM  Result Value Ref Range   MRSA by PCR NEGATIVE NEGATIVE    Comment:        The GeneXpert MRSA Assay (  FDA approved for NASAL specimens only), is one component of a comprehensive MRSA colonization surveillance program. It is not intended to diagnose MRSA infection nor to guide or monitor treatment for MRSA infections.   Glucose, capillary     Status: Abnormal   Collection Time: 03/15/17  3:04 PM  Result Value Ref Range   Glucose-Capillary 235 (H) 65 - 99 mg/dL  Hemoglobin A1c      Status: Abnormal   Collection Time: 03/15/17  3:39 PM  Result Value Ref Range   Hgb A1c MFr Bld 10.1 (H) 4.8 - 5.6 %    Comment: (NOTE) Pre diabetes:          5.7%-6.4% Diabetes:              >6.4% Glycemic control for   <7.0% adults with diabetes    Mean Plasma Glucose 243.17 mg/dL  Sedimentation rate     Status: Abnormal   Collection Time: 03/15/17  3:39 PM  Result Value Ref Range   Sed Rate 127 (H) 0 - 16 mm/hr  C-reactive protein     Status: Abnormal   Collection Time: 03/15/17  3:39 PM  Result Value Ref Range   CRP 23.1 (H) <1.0 mg/dL  Prealbumin     Status: Abnormal   Collection Time: 03/15/17  3:39 PM  Result Value Ref Range   Prealbumin 7.4 (L) 18 - 38 mg/dL  Glucose, capillary     Status: Abnormal   Collection Time: 03/15/17  5:08 PM  Result Value Ref Range   Glucose-Capillary 258 (H) 65 - 99 mg/dL    MICRO: reviewed IMAGING: Mr Foot Right Wo Contrast  Result Date: 03/15/2017 CLINICAL DATA:  Right foot pain and swelling over the past few days in a diabetic patient. History of a skin ulceration on the foot for over a month. EXAM: MRI OF THE RIGHT FOREFOOT WITHOUT CONTRAST TECHNIQUE: Multiplanar, multisequence MR imaging of the right forefoot was performed. No intravenous contrast was administered. COMPARISON:  Plain films right foot 01/07/2017 scratch the MRI right foot 01/07/2017. Plain films right foot 01/06/2017 and earlier today. FINDINGS: Bones/Joint/Cartilage Marrow edema in the sesamoid bones of the first MTP joint is new since the prior MRI and much more intense in the lateral sesamoid. Degenerative subchondral cyst at the third tarsometatarsal joint in the base the metatarsal is noted. Bone marrow signal is otherwise unremarkable. Ligaments Intact. Muscles and Tendons Edema throughout all intrinsic musculature of the foot is unchanged. Soft tissues Intense subcutaneous edema is present over the dorsum of the foot. An air and fluid collection centered just proximal  to the lateral sesamoid measures approximately 1.2 cm transverse by 1 cm craniocaudal by 2.8 cm long and worrisome for abscess. This is new since the prior examination. Skin ulceration at the level of the head of the first metatarsal is again seen. There appears to be blistering of the skin more proximally and laterally to the skin ulceration. IMPRESSION: New marrow edema in the medial and lateral sesamoids of the first MTP joint is more intense in the lateral sesamoid bone and consistent with osteomyelitis. Air and fluid collection in the plantar soft tissues centered just proximal to the lateral sesamoid of the first MTP joint is compatible with an abscess. Skin ulceration at the level of the head of the first metatarsal is identified. There appears to be a blister on the skin surface more proximally and laterally. Electronically Signed   By: Inge Rise M.D.   On: 03/15/2017 14:04  Dg Foot Complete Right  Result Date: 03/14/2017 CLINICAL DATA:  51 year old male with open wound on the lateral side of the right foot at the base of the fifth metatarsal. History of diabetes. EXAM: RIGHT FOOT COMPLETE - 3+ VIEW COMPARISON:  Right foot MRI dated 01/07/2017 FINDINGS: There is no acute fracture or dislocation. The bones are well mineralized. Mild arthritic changes. No periosteal reaction or bone erosion. There is skin ulcer with soft tissue air in the plantar aspect of the forefoot at the level of the MTP joints. A second skin ulcer noted along the lateral aspect of the foot at the base of the fifth metatarsal. No soft tissue air noted adjacent to the this ulcer. There is diffuse soft tissue swelling of the foot. IMPRESSION: 1. No acute fracture or dislocation. No evidence of osteomyelitis by radiograph. MRI or a white blood cell nuclear scan may provide better evaluation if there is high clinical concern for acute osteomyelitis. 2. Diffuse soft tissue swelling of the foot with ulcers and soft tissue air as  described most consistent with cellulitis. Electronically Signed   By: Anner Crete M.D.   On: 03/14/2017 22:08    Assessment/Plan:  51yo M with well controlled hiv disease, but poorly controlled IDDM2 c/b peripheral neuropathy and DFU that has now progressed to Diabetic osteomyelitis. Hx of acute on chronic ckd with vancomycin recently  - plan to empirically treat with dapto '8mg'$ /kg, check baseline ck. Stop linezolid. Continue with piptazo. Await culture results - will need to 6 wk iv abtx - appreciate ortho helping with I x D and further management  For his cough = will check with cxr  hiv disease = well controlled. Continue on home regimen.

## 2017-03-15 NOTE — ED Notes (Signed)
Report attempted 

## 2017-03-15 NOTE — ED Notes (Signed)
Patient is sleeping with call bell in reach

## 2017-03-15 NOTE — H&P (Signed)
History and Physical    Keith [name] WJX:[id number] DOB: 03-Nov-1965 DOA: 03/15/2017  PCP: Scot Jun, FNP  Patient coming from: Home.  Chief Complaint: Right foot pain and discharge.  HPI: Keith [name] is a 51 y.o. male with history of HIV, diabetes mellitus type 2, hypertension presents to the ER with complaints of increasing pain and swelling in the right foot for the last few days.  Patient also noticed increasing discharge from the foot ulcer.  Patient states he has been compliant with his anti-retroviral's and Lantus dose.  Patient was admitted in October around 6 weeks ago for similar complaints and at that time was treated conservatively.  ED Course: In the ER on exam patient's foot has put on discharge from the ulceration on the plantar aspect of the foot.  Patient was tachycardic on admission.  Blood cultures are obtained and started on empiric antibiotics.  The patient was started on sepsis protocol with IV fluid bolus.  Patient blood sugar is found to be around 500 but not in DKA.  10 units of NovoLog was given and I have ordered patient's home dose of Lantus 1 dose now.  Review of Systems: As per HPI, rest all negative.   Past Medical History:  Diagnosis Date  . AIDS ([place])   . Chronic knee pain    right  . Chronic pain   . Diabetes mellitus   . Diabetes type 2, uncontrolled ([place])    HgA1c 17.6 (04/27/2010)  . Diabetic foot ulcer ([place]) 01/2017   right foot  . Erectile dysfunction   . Genital warts   . HIV (human immunodeficiency virus infection) ([place]) 2009   CD4 count 100, VL 13800 (05/01/2010)  . Hypertension   . Osteomyelitis [facility])    h/o hand    Past Surgical History:  Procedure Laterality Date  . HERNIA REPAIR    . I&D EXTREMITY Left 08/21/2014   Procedure: INCISION AND DRAINAGE LEFT SMALL FINGER;  Surgeon: Leanora Cover, MD;  Location: [place];  Service: Orthopedics;  Laterality: Left;  . MINOR IRRIGATION AND DEBRIDEMENT OF WOUND Right 04/22/2014   Procedure: IRRIGATION AND DEBRIDEMENT OF RIGHT NECK ABCESS;  Surgeon: Jerrell Belfast, MD;  Location: [place];  Service: ENT;  Laterality: Right;  . MULTIPLE EXTRACTIONS WITH ALVEOLOPLASTY N/A 01/18/2013   Procedure: MULTIPLE EXTRACION 3, 6, 7, 10, 11, 13, 21, 22, 27, 28, 29, 30 WITH ALVEOLOPLASTY;  Surgeon: Gae Bon, DDS;  Location: [place];  Service: Oral Surgery;  Laterality: N/A;     reports that  has never smoked. he has never used smokeless tobacco. He reports that he does not drink alcohol or use drugs.  Allergies  Allergen Reactions  . Sulfa Antibiotics Itching    Family History  Problem Relation Age of Onset  . Hypertension Mother   . Arthritis Father   . Hypertension Father   . Hypertension Brother   . Cancer Maternal Grandmother 74       unknown type of cancer  . Depression Paternal Grandmother     Prior to Admission medications   Medication Sig Start Date End Date Taking? Authorizing Provider  abacavir-dolutegravir-lamiVUDine (TRIUMEQ) 600-50-300 MG tablet Take 1 tablet by mouth daily. 01/20/17   [name], MD  amLODipine (NORVASC) 10 MG tablet Take 1 tablet (10 mg total) by mouth daily. 01/17/17   Nita Sells, MD  cyclobenzaprine (FLEXERIL) 10 MG tablet Take 1 tablet (10 mg total) by mouth 2 (two) times daily as needed  for muscle spasms. 10/25/16   Ashley Murrain, NP  darunavir-cobicistat (PREZCOBIX) 800-150 MG tablet Take 1 tablet by mouth daily. Swallow whole. Do NOT crush, break or chew tablets. Take with food. 01/20/17   [name], MD  doxycycline (VIBRA-TABS) 100 MG tablet Take 1 tablet (100 mg total) by mouth 2 (two) times daily. 01/09/17   [name], MD  doxycycline (VIBRA-TABS) 100 MG tablet Take 1 tablet (100 mg total) by mouth every 12 (twelve) hours. 01/16/17   Nita Sells, MD  glimepiride (AMARYL) 1 MG tablet Take 1 tablet (1 mg total) by mouth every morning. 01/16/17 01/16/18  Nita Sells, MD  Insulin Glargine  (LANTUS SOLOSTAR) 100 UNIT/ML Solostar Pen Inject 20 Units into the skin daily at 10 pm. 01/16/17   Nita Sells, MD  Insulin Syringes, Disposable, U-100 0.3 ML MISC For administration of insulin TID. ICD E-11.9 10/29/16   Scot Jun, FNP  LORazepam (ATIVAN) 1 MG tablet Take 1 tablet (1 mg total) by mouth every 6 (six) hours as needed for anxiety. 01/30/15   [name], MD  naloxone [facility]) nasal spray 4 mg/0.1 mL Place 1 spray into the nose daily as needed (opioid overdose).    [provider]  Oxycodone HCl 10 MG TABS Take 1 tablet (10 mg total) by mouth every 8 (eight) hours as needed (moderate or severe pain). Patient taking differently: Take 10 mg by mouth 4 (four) times daily.  09/29/16   Nita Sells, MD    Physical Exam: Vitals:   03/15/17 0159 03/15/17 0434 03/15/17 0500 03/15/17 0515  BP: 116/66  138/80 132/77  Pulse: (!) 103  (!) 107 (!) 102  Resp: 14  (!) 21 (!) 21  Temp:  (!) 101 F (38.3 C)    TempSrc:  Rectal    SpO2: 96%  97% 97%  Weight:      Height:          Constitutional: Moderately built and nourished. Vitals:   03/15/17 0159 03/15/17 0434 03/15/17 0500 03/15/17 0515  BP: 116/66  138/80 132/77  Pulse: (!) 103  (!) 107 (!) 102  Resp: 14  (!) 21 (!) 21  Temp:  (!) 101 F (38.3 C)    TempSrc:  Rectal    SpO2: 96%  97% 97%  Weight:      Height:       Eyes: Anicteric no pallor. ENMT: No discharge from the ears eyes nose or mouth. Neck: No mass felt.  No neck rigidity. Respiratory: No rhonchi or crepitations. Cardiovascular: S1-S2 heard no murmurs appreciated. Abdomen: Soft nontender bowel sounds present. Musculoskeletal: Right foot looks edematous and has tenderness.  There is ulceration on the plantar aspect of the right foot. Skin: Right foot plantar aspect has ulceration which has discharge. Neurologic: Alert awake oriented to time place and person.  Moves all extremities. Psychiatric: Appears normal.  Normal  affect.   Labs on Admission: I have personally reviewed following labs and imaging studies  CBC: Recent Labs  Lab 03/15/17 0348  WBC 21.2*  NEUTROABS 18.2*  HGB 9.9*  HCT 29.7*  MCV 84.9  PLT 751   Basic Metabolic Panel: Recent Labs  Lab 03/15/17 0348  NA 126*  K 4.2  CL 91*  CO2 22  GLUCOSE 500*  BUN 24*  CREATININE 2.11*  CALCIUM 8.9   GFR: Estimated Creatinine Clearance: 49.8 mL/min (A) (by C-G formula based on SCr of 2.11 mg/dL (H)). Liver Function Tests: No results for input(s):  AST, ALT, ALKPHOS, BILITOT, PROT, ALBUMIN in the last 168 hours. No results for input(s): LIPASE, AMYLASE in the last 168 hours. No results for input(s): AMMONIA in the last 168 hours. Coagulation Profile: No results for input(s): INR, PROTIME in the last 168 hours. Cardiac Enzymes: No results for input(s): CKTOTAL, CKMB, CKMBINDEX, TROPONINI in the last 168 hours. BNP (last 3 results) No results for input(s): PROBNP in the last 8760 hours. HbA1C: No results for input(s): HGBA1C in the last 72 hours. CBG: Recent Labs  Lab 03/15/17 0536  GLUCAP 361*   Lipid Profile: No results for input(s): CHOL, HDL, LDLCALC, TRIG, CHOLHDL, LDLDIRECT in the last 72 hours. Thyroid Function Tests: No results for input(s): TSH, T4TOTAL, FREET4, T3FREE, THYROIDAB in the last 72 hours. Anemia Panel: No results for input(s): VITAMINB12, FOLATE, FERRITIN, TIBC, IRON, RETICCTPCT in the last 72 hours. Urine analysis:    Component Value Date/Time   COLORURINE YELLOW 11/18/2016 1429   APPEARANCEUR CLOUDY (A) 11/18/2016 1429   LABSPEC >=1.030 01/01/2017 1032   PHURINE 5.5 01/01/2017 1032   GLUCOSEU 250 (A) 01/01/2017 1032   HGBUR SMALL (A) 01/01/2017 1032   BILIRUBINUR NEGATIVE 01/01/2017 1032   KETONESUR NEGATIVE 01/01/2017 1032   PROTEINUR 100 (A) 01/01/2017 1032   UROBILINOGEN 1.0 01/01/2017 1032   NITRITE NEGATIVE 01/01/2017 1032   LEUKOCYTESUR NEGATIVE 01/01/2017 1032   Sepsis  Labs: @LABRCNTIP (procalcitonin:4,lacticidven:4) )No results found for this or any previous visit (from the past 240 hour(s)).   Radiological Exams on Admission: Dg Foot Complete Right  Result Date: 03/14/2017 CLINICAL DATA:  51 year old male with open wound on the lateral side of the right foot at the base of the fifth metatarsal. History of diabetes. EXAM: RIGHT FOOT COMPLETE - 3+ VIEW COMPARISON:  Right foot MRI dated 01/07/2017 FINDINGS: There is no acute fracture or dislocation. The bones are well mineralized. Mild arthritic changes. No periosteal reaction or bone erosion. There is skin ulcer with soft tissue air in the plantar aspect of the forefoot at the level of the MTP joints. A second skin ulcer noted along the lateral aspect of the foot at the base of the fifth metatarsal. No soft tissue air noted adjacent to the this ulcer. There is diffuse soft tissue swelling of the foot. IMPRESSION: 1. No acute fracture or dislocation. No evidence of osteomyelitis by radiograph. MRI or a white blood cell nuclear scan may provide better evaluation if there is high clinical concern for acute osteomyelitis. 2. Diffuse soft tissue swelling of the foot with ulcers and soft tissue air as described most consistent with cellulitis. Electronically Signed   By: Anner Crete M.D.   On: 03/14/2017 22:08     Assessment/Plan Principal Problem:   Sepsis ([place]) Active Problems:   Essential hypertension   Insulin-requiring or dependent type II diabetes mellitus (HCC)   HIV disease (H[facility])   CKD (chronic kidney disease) stage 3, GFR 30-59 ml/min ([place])    1. Abscess from right foot cellulitis and possible abscess -I have ordered MRI of the right foot.  Patient is on Zosyn did receive 1 dose of vancomycin but since patient has had renal failure from vancomycin I have ordered Zyvox 1 dose.  Further dose of Zyvox needs to be given after discussing with infectious disease.  Follow cultures.  Continue  hydration. 2. Diabetes mellitus type 2 with hyperglycemia -I have given 1 dose of Lantus 20 units home dose and patient has received NovoLog 10 units in the ER.  I have placed patient  on moderate dose sliding scale coverage.  Closely follow metabolic panel to make sure there is no anion gap.  The next metabolic panel is at 10 AM on March 15, 2017. 3. Hypertension on amlodipine which should be continued. 4. Renal failure from use of vancomycin as per the chart.  Follow metabolic panel. 5. Chronic anemia -follow CBC. 6. History of HIV last CD4 count in our system in February 2018 was 210.  Patient is on retroviral.   DVT prophylaxis: SCDs until MRI results available. Code Status: Full code. Family Communication: Discussed with patient. Disposition Plan: Home. Consults called: None. Admission status: Inpatient.   Rise Patience MD [facility] Pager [phone].  If 7PM-7AM, please contact night-coverage [email or web address] Password [email or web address]  03/15/2017, 6:07 AM

## 2017-03-15 NOTE — Progress Notes (Signed)
Pharmacy Antibiotic Note  Keith [name] is a 51 y.o. male admitted on 03/15/2017 with diabetic wound.  Pharmacy has been consulted for Vancomycin/Zosyn dosing. WBC is elevated. Noted renal dysfunction.   Plan: Vancomycin 1500 mg IV q24h Zosyn 3.375G IV q8h to be infused over 4 hours Trend WBC, temp, renal function  F/U infectious work-up Drug levels as indicated   Height: 5\' 9"  (175.3 cm) Weight: 240 lb (108.9 kg) IBW/kg (Calculated) : 70.7  Temp (24hrs), Avg:99.5 F (37.5 C), Min:98 F (36.7 C), Max:101 F (38.3 C)  Recent Labs  Lab 03/15/17 0348 03/15/17 0400  WBC 21.2*  --   CREATININE 2.11*  --   LATICACIDVEN  --  1.23    Estimated Creatinine Clearance: 49.8 mL/min (A) (by C-G formula based on SCr of 2.11 mg/dL (H)).    Allergies  Allergen Reactions  . Sulfa Antibiotics Itching   Keith [name] 03/15/2017 6:53 AM

## 2017-03-15 NOTE — Consult Note (Signed)
  51 year old male with  Diabetes, HIV and chronic right foot neurotrophic ulcer underlying the right great toe MTP. Seen by [name] one time in office 01/2017 and found to have a clean  Shallow ulcer underlying the right great toe MTP. It was dressed and he missed follow up appoinment. Presents to the emergency room today with increasing right foot swelling with  Pain and malorderous draining right foot. He is fatigued and has  Malaise.  Right Foot with a fluctuance underlying the 1st and 2nd metatarsals at the distal 1/3 metatarsal level. The ulcer underlying the great toe is dry. There is bullous formation in the  Area of fluctuance. The foot is generally swollen with warmth.  DP is absent but the PT is present. All of the digits are intact.Marland Kitchen MRI with finding consistent with osteomyelitis of the great toe lateral sesamoid. There is an abscess at the plantar aspect of  The 1st-2nd distal metatarsals, diffuse chronic interstitial edema of most of the intrinsic muscles of the foot. No joint effusions. Only the lateral sesamoid of the right great toe. I performed a localized incision and drainage of the area of maximum fluctuance and pack with saline soaked 4x4 after sponging with H2O2.  Dressing with 4x4s ABDs and kerlix.  Impression: Left foot forefoot plantar abscess with area of lateral sesamoid osteomyelitis. Left foot cellulitis. Diabetes. HIV.  Plan: Would recommend for IV antibiotics and dressings. Hopefully the infection with localize and be able to be treated with Serial debridements. If unable to control then BKA but the studies do not show more than sesamoid involvement with osteomyelitis. Will follow and refer to [name] when he is back  Early this week.

## 2017-03-15 NOTE — Progress Notes (Signed)
Paged team regarding complaint of severe right foot pain, return call by MD, verbal order to resume home oxycodone 10mg  q8hrs as needed for pain.

## 2017-03-15 NOTE — ED Notes (Signed)
Pt given sugar free cookies for snack

## 2017-03-16 DIAGNOSIS — D72829 Elevated white blood cell count, unspecified: Secondary | ICD-10-CM

## 2017-03-16 DIAGNOSIS — M86071 Acute hematogenous osteomyelitis, right ankle and foot: Secondary | ICD-10-CM

## 2017-03-16 LAB — GLUCOSE, CAPILLARY
Glucose-Capillary: 208 mg/dL — ABNORMAL HIGH (ref 65–99)
Glucose-Capillary: 188 mg/dL — ABNORMAL HIGH (ref 65–99)
Glucose-Capillary: 180 mg/dL — ABNORMAL HIGH (ref 65–99)
Glucose-Capillary: 191 mg/dL — ABNORMAL HIGH (ref 65–99)

## 2017-03-16 LAB — BASIC METABOLIC PANEL
Anion gap: 10 (ref 5–15)
BUN: 17 mg/dL (ref 6–20)
CO2: 25 mmol/L (ref 22–32)
Calcium: 8.5 mg/dL — ABNORMAL LOW (ref 8.9–10.3)
Chloride: 101 mmol/L (ref 101–111)
Creatinine, Ser: 1.95 mg/dL — ABNORMAL HIGH (ref 0.61–1.24)
GFR calc Af Amer: 44 mL/min — ABNORMAL LOW (ref >60)
GFR calc non Af Amer: 38 mL/min — ABNORMAL LOW (ref >60)
Glucose, Bld: 170 mg/dL — ABNORMAL HIGH (ref 65–99)
Potassium: 3.5 mmol/L (ref 3.5–5.1)
Sodium: 136 mmol/L (ref 135–145)

## 2017-03-16 LAB — CBC
HCT: 26.8 % — ABNORMAL LOW (ref 39.0–52.0)
Hemoglobin: 8.8 g/dL — ABNORMAL LOW (ref 13.0–17.0)
MCH: 27.4 pg (ref 26.0–34.0)
MCHC: 32.8 g/dL (ref 30.0–36.0)
MCV: 83.5 fL (ref 78.0–100.0)
Platelets: 244 10*3/uL (ref 150–400)
RBC: 3.21 MIL/uL — ABNORMAL LOW (ref 4.22–5.81)
RDW: 12.7 % (ref 11.5–15.5)
WBC: 16.5 10*3/uL — ABNORMAL HIGH (ref 4.0–10.5)

## 2017-03-16 LAB — CK: Total CK: 113 U/L (ref 49–397)

## 2017-03-16 MED ORDER — SODIUM CHLORIDE 0.9 % IV SOLN
INTRAVENOUS | Status: DC
  Administered 2017-03-16 – 2017-03-22 (×9): via INTRAVENOUS

## 2017-03-16 MED ORDER — HEPARIN SODIUM (PORCINE) 5000 UNIT/ML IJ SOLN
5000.0000 [IU] | Freq: Three times a day (TID) | INTRAMUSCULAR | Status: DC
  Administered 2017-03-16 – 2017-03-24 (×23): 5000 [IU] via SUBCUTANEOUS
  Filled 2017-03-16 (×23): qty 1

## 2017-03-16 MED ORDER — GUAIFENESIN-DM 100-10 MG/5ML PO SYRP
5.0000 mL | ORAL_SOLUTION | ORAL | Status: DC | PRN
  Administered 2017-03-16 – 2017-03-17 (×2): 5 mL via ORAL
  Filled 2017-03-16 (×2): qty 5

## 2017-03-16 NOTE — Progress Notes (Signed)
Pharmacy Antibiotic Note  Keith [name] is a 51 y.o. male with well-controlled HIV who was admitted on 03/15/2017 with a diabetic foot wound.  Pharmacy has been consulted for daptomycin dosing. WBC is elevated, Pt is afebrile. Noted renal dysfunction.   MRI shows osteo involvement and plantar abscess. The abscess was debrided yesterday. Cultures are pending. Pt has a history of AKI on Vancomycin. Plans are to treat the OM for a total of 6 weeks.   Plan: Discontinue Vancomycin 1500 mg IV q24h Continue Zosyn 3.375G IV q8h to be infused over 4 hours Start Daptomycin at 8mg /kg Q24h Trend WBC, temp, renal function  F/U infectious work-up and de-escalate as appropriate Drug levels as indicated  Height: 5\' 9"  (175.3 cm) Weight: 257 lb 15 oz (117 kg) IBW/kg (Calculated) : 70.7  Temp (24hrs), Avg:98.6 F (37 C), Min:98.4 F (36.9 C), Max:98.8 F (37.1 C)  Recent Labs  Lab 03/15/17 0348 03/15/17 0400 03/15/17 1008 03/16/17 0230  WBC 21.2*  --   --  16.5*  CREATININE 2.11*  --  1.91* 1.95*  LATICACIDVEN  --  1.23  --   --     Estimated Creatinine Clearance: 55.9 mL/min (A) (by C-G formula based on SCr of 1.95 mg/dL (H)).    Allergies  Allergen Reactions  . Sulfa Antibiotics Itching    Leroy Libman, PharmD Pharmacy Resident Pager: [phone]

## 2017-03-16 NOTE — Progress Notes (Signed)
[name], male   DOB: April 13, 1965, 51 y.o.   MRN: [id number] Patient discussed with  [name] and he will see him tomorrow.  Dressings BID

## 2017-03-16 NOTE — Plan of Care (Signed)
  Progressing Elimination: Will not experience complications related to bowel motility 03/16/2017 0747 - Progressing by Verne Grain, RN Note Last bm 12/9  Will not experience complications related to urinary retention 03/16/2017 0747 - Progressing by Verne Grain, RN Note Patient incontinent episode overnight, reassured patient, since patient feeling embarrassed about incontinence.   Pain Managment: General experience of comfort will improve 03/16/2017 0747 - Progressing by Verne Grain, RN Note Pain managed with oxycodone prn, effective pain tolerable.  Safety: Ability to remain free from injury will improve 03/16/2017 0747 - Progressing by Verne Grain, RN

## 2017-03-16 NOTE — Progress Notes (Signed)
PROGRESS NOTE    Keith ARISMENDEZ  SWF:[id number] DOB: [birth year] DOA: 03/15/2017 PCP: Scot Jun, FNP     Brief Narrative:  Keith [name] a 51 y.o.malewithhistory of HIV, diabetes mellitus type 2, hypertension presents to the ER with complaints of increasing pain and swelling in the right foot for the last few days. Patient also noticed increasing discharge from the foot ulcer.Patient states he has been compliant with his anti-retroviral's and Lantus dose. On exam, he has a first metatarsal ulcer on plantar surface as well as skin laceration on lateral edge of foot. MRI right foot completed showed first MTP joint osteomyelitis as well as abscess. Orthopedic surgery was consulted as well as ID.   Assessment & Plan:   Principal Problem:   Sepsis ([place]) Active Problems:   Essential hypertension   Insulin-requiring or dependent type II diabetes mellitus (HCC)   HIV disease (HCC)   CKD (chronic kidney disease) stage 3, GFR 30-59 ml/min (HCC)   Sepsis secondary to osteomyelitis of right first MTP, DM wound  -Ortho and ID consulted -S/p incision and drainage by Dr. Louanne Skye 12/8  -Continue dapto/zosyn  -Await culture -Check ABI  -WBC trending down, afebrile today   DM type 2, uncontrolled with hyperglycemia -Ha1c 10.1 -DM coordinator consult  -Lantus, Novolog, SSI   HIV -Well controlled. Continue meds   AKI   -Baseline Cr 1 -Continue IVF   HTN -Continue norvasc     DVT prophylaxis: subq hep Code Status: full Family Communication: no family at bedside Disposition Plan: pending improvement   Consultants:   ID  Ortho  Procedures:   S/p incision and drainage by Dr. Louanne Skye 12/8   Antimicrobials:  Anti-infectives (From admission, onward)   Start     Dose/Rate Route Frequency Ordered Stop   03/16/17 0800  vancomycin (VANCOCIN) 1,500 mg in sodium chloride 0.9 % 500 mL IVPB  Status:  Discontinued     1,500 mg 250 mL/hr over 120 Minutes Intravenous Every  24 hours 03/15/17 0651 03/15/17 0825   03/15/17 2000  DAPTOmycin (CUBICIN) 871 mg in sodium chloride 0.9 % IVPB     8 mg/kg  108.9 kg 234.8 mL/hr over 30 Minutes Intravenous Every 24 hours 03/15/17 1832     03/15/17 1400  piperacillin-tazobactam (ZOSYN) IVPB 3.375 g     3.375 g 12.5 mL/hr over 240 Minutes Intravenous Every 8 hours 03/15/17 0651     03/15/17 1200  abacavir-dolutegravir-lamiVUDine (TRIUMEQ) 600-50-300 MG per tablet 1 tablet     1 tablet Oral Daily 03/15/17 0607     03/15/17 1200  darunavir-cobicistat (PREZCOBIX) 800-150 MG per tablet 1 tablet    Comments:  Swallow whole. Do NOT crush, break or chew tablets. Take with food.     1 tablet Oral Daily 03/15/17 0607     03/15/17 0900  linezolid (ZYVOX) tablet 600 mg     600 mg Oral  Once 03/15/17 0828 03/15/17 1052   03/15/17 0700  vancomycin (VANCOCIN) 500 mg in sodium chloride 0.9 % 100 mL IVPB  Status:  Discontinued     500 mg 100 mL/hr over 60 Minutes Intravenous  Once 03/15/17 0651 03/15/17 1013   03/15/17 0330  vancomycin (VANCOCIN) IVPB 1000 mg/200 mL premix     1,000 mg 200 mL/hr over 60 Minutes Intravenous  Once 03/15/17 0328 03/15/17 0644   03/15/17 0330  piperacillin-tazobactam (ZOSYN) IVPB 3.375 g     3.375 g 100 mL/hr over 30 Minutes Intravenous  Once 03/15/17 0328 03/15/17 0514  Subjective: No new complaints.   Objective: Vitals:   03/15/17 1245 03/15/17 1458 03/15/17 2147 03/16/17 0553  BP: (!) 149/89 (!) 152/67 (!) 148/89 138/86  Pulse: 85 94 (!) 106 93  Resp: 15 (!) 21 19 18   Temp:  98.5 F (36.9 C) 98.8 F (37.1 C) 98.4 F (36.9 C)  TempSrc:  Oral Oral Oral  SpO2: 99% 99% 98% 100%  Weight:    117 kg (257 lb 15 oz)  Height:        Intake/Output Summary (Last 24 hours) at 03/16/2017 1244 Last data filed at 03/16/2017 1056 Gross per 24 hour  Intake 2886.17 ml  Output 400 ml  Net 2486.17 ml   Filed Weights   03/14/17 2123 03/16/17 0553  Weight: 108.9 kg (240 lb) 117 kg (257 lb 15 oz)      Examination:  General exam: Appears calm and comfortable  Respiratory system: Clear to auscultation. Respiratory effort normal. Cardiovascular system: S1 & S2 heard, RRR. No JVD, murmurs, rubs, gallops or clicks. No pedal edema. Gastrointestinal system: Abdomen is nondistended, soft and nontender. No organomegaly or masses felt. Normal bowel sounds heard. Central nervous system: Alert and oriented. No focal neurological deficits. Extremities: Symmetric  Skin: +right foot with dressing in tact, dressing with serous fluid  Psychiatry: Judgement and insight appear normal  Data Reviewed: I have personally reviewed following labs and imaging studies  CBC: Recent Labs  Lab 03/15/17 0348 03/16/17 0230  WBC 21.2* 16.5*  NEUTROABS 18.2*  --   HGB 9.9* 8.8*  HCT 29.7* 26.8*  MCV 84.9 83.5  PLT 252 151   Basic Metabolic Panel: Recent Labs  Lab 03/15/17 0348 03/15/17 1008 03/16/17 0230  NA 126* 129* 136  K 4.2 3.6 3.5  CL 91* 97* 101  CO2 22 23 25   GLUCOSE 500* 360* 170*  BUN 24* 23* 17  CREATININE 2.11* 1.91* 1.95*  CALCIUM 8.9 8.2* 8.5*   GFR: Estimated Creatinine Clearance: 55.9 mL/min (A) (by C-G formula based on SCr of 1.95 mg/dL (H)). Liver Function Tests: No results for input(s): AST, ALT, ALKPHOS, BILITOT, PROT, ALBUMIN in the last 168 hours. No results for input(s): LIPASE, AMYLASE in the last 168 hours. No results for input(s): AMMONIA in the last 168 hours. Coagulation Profile: No results for input(s): INR, PROTIME in the last 168 hours. Cardiac Enzymes: No results for input(s): CKTOTAL, CKMB, CKMBINDEX, TROPONINI in the last 168 hours. BNP (last 3 results) No results for input(s): PROBNP in the last 8760 hours. HbA1C: Recent Labs    03/15/17 1539  HGBA1C 10.1*   CBG: Recent Labs  Lab 03/15/17 1504 03/15/17 1708 03/15/17 2145 03/16/17 0821 03/16/17 1200  GLUCAP 235* 258* 138* 208* 188*   Lipid Profile: No results for input(s): CHOL, HDL,  LDLCALC, TRIG, CHOLHDL, LDLDIRECT in the last 72 hours. Thyroid Function Tests: No results for input(s): TSH, T4TOTAL, FREET4, T3FREE, THYROIDAB in the last 72 hours. Anemia Panel: No results for input(s): VITAMINB12, FOLATE, FERRITIN, TIBC, IRON, RETICCTPCT in the last 72 hours. Sepsis Labs: Recent Labs  Lab 03/15/17 0400  LATICACIDVEN 1.23    Recent Results (from the past 240 hour(s))  Wound or Superficial Culture     Status: None (Preliminary result)   Collection Time: 03/15/17  3:28 AM  Result Value Ref Range Status   Specimen Description WOUND RIGHT FOOT  Final   Special Requests NONE  Final   Gram Stain   Final    ABUNDANT WBC PRESENT, PREDOMINANTLY PMN ABUNDANT GRAM  NEGATIVE RODS ABUNDANT GRAM POSITIVE COCCI    Culture CULTURE REINCUBATED FOR BETTER GROWTH  Final   Report Status PENDING  Incomplete  MRSA PCR Screening     Status: None   Collection Time: 03/15/17  2:45 PM  Result Value Ref Range Status   MRSA by PCR NEGATIVE NEGATIVE Final    Comment:        The GeneXpert MRSA Assay (FDA approved for NASAL specimens only), is one component of a comprehensive MRSA colonization surveillance program. It is not intended to diagnose MRSA infection nor to guide or monitor treatment for MRSA infections.        Radiology Studies: Mr Foot Right Wo Contrast  Result Date: 03/15/2017 CLINICAL DATA:  Right foot pain and swelling over the past few days in a diabetic patient. History of a skin ulceration on the foot for over a month. EXAM: MRI OF THE RIGHT FOREFOOT WITHOUT CONTRAST TECHNIQUE: Multiplanar, multisequence MR imaging of the right forefoot was performed. No intravenous contrast was administered. COMPARISON:  Plain films right foot 01/07/2017 scratch the MRI right foot 01/07/2017. Plain films right foot 01/06/2017 and earlier today. FINDINGS: Bones/Joint/Cartilage Marrow edema in the sesamoid bones of the first MTP joint is new since the prior MRI and much more  intense in the lateral sesamoid. Degenerative subchondral cyst at the third tarsometatarsal joint in the base the metatarsal is noted. Bone marrow signal is otherwise unremarkable. Ligaments Intact. Muscles and Tendons Edema throughout all intrinsic musculature of the foot is unchanged. Soft tissues Intense subcutaneous edema is present over the dorsum of the foot. An air and fluid collection centered just proximal to the lateral sesamoid measures approximately 1.2 cm transverse by 1 cm craniocaudal by 2.8 cm long and worrisome for abscess. This is new since the prior examination. Skin ulceration at the level of the head of the first metatarsal is again seen. There appears to be blistering of the skin more proximally and laterally to the skin ulceration. IMPRESSION: New marrow edema in the medial and lateral sesamoids of the first MTP joint is more intense in the lateral sesamoid bone and consistent with osteomyelitis. Air and fluid collection in the plantar soft tissues centered just proximal to the lateral sesamoid of the first MTP joint is compatible with an abscess. Skin ulceration at the level of the head of the first metatarsal is identified. There appears to be a blister on the skin surface more proximally and laterally. Electronically Signed   By: Inge Rise M.D.   On: 03/15/2017 14:04   Dg Chest Port 1 View  Result Date: 03/15/2017 CLINICAL DATA:  Cough for 2 weeks EXAM: PORTABLE CHEST 1 VIEW COMPARISON:  03/29/2016 FINDINGS: Cardiac shadow is within normal limits. The lungs are well aerated bilaterally. Mild right basilar atelectasis is noted. No acute bony abnormality is seen. IMPRESSION: Mild right basilar atelectasis. Electronically Signed   By: Inez Catalina M.D.   On: 03/15/2017 20:59   Dg Foot Complete Right  Result Date: 03/14/2017 CLINICAL DATA:  51 year old male with open wound on the lateral side of the right foot at the base of the fifth metatarsal. History of diabetes. EXAM: RIGHT  FOOT COMPLETE - 3+ VIEW COMPARISON:  Right foot MRI dated 01/07/2017 FINDINGS: There is no acute fracture or dislocation. The bones are well mineralized. Mild arthritic changes. No periosteal reaction or bone erosion. There is skin ulcer with soft tissue air in the plantar aspect of the forefoot at the level of the MTP joints.  A second skin ulcer noted along the lateral aspect of the foot at the base of the fifth metatarsal. No soft tissue air noted adjacent to the this ulcer. There is diffuse soft tissue swelling of the foot. IMPRESSION: 1. No acute fracture or dislocation. No evidence of osteomyelitis by radiograph. MRI or a white blood cell nuclear scan may provide better evaluation if there is high clinical concern for acute osteomyelitis. 2. Diffuse soft tissue swelling of the foot with ulcers and soft tissue air as described most consistent with cellulitis. Electronically Signed   By: Anner Crete M.D.   On: 03/14/2017 22:08      Scheduled Meds: . abacavir-dolutegravir-lamiVUDine  1 tablet Oral Daily  . amLODipine  10 mg Oral Daily  . darunavir-cobicistat  1 tablet Oral Daily  . heparin injection (subcutaneous)  5,000 Units Subcutaneous Q8H  . insulin aspart  0-15 Units Subcutaneous TID WC  . insulin aspart  5 Units Subcutaneous TID WC  . insulin glargine  25 Units Subcutaneous Daily   Continuous Infusions: . sodium chloride    . DAPTOmycin (CUBICIN)  IV Stopped (03/15/17 2043)  . piperacillin-tazobactam (ZOSYN)  IV Stopped (03/16/17 0934)     LOS: 1 day    Time spent: 30 minutes   Dessa Phi, DO [facility] [email or web address] Password [email or web address] 03/16/2017, 12:44 PM

## 2017-03-16 NOTE — Progress Notes (Signed)
[facility] for Infectious Disease    Date of Admission:  03/15/2017   Total days of antibiotics 3        Day 3 piptazo        Day 2 dapto           ID: Keith [name] is a 51 y.o. male with well controlled hiv disease, but poorly controlled DM with diabetic neuropathy and worsening dfu now c/w osteo Principal Problem:   Sepsis ([place]) Active Problems:   Essential hypertension   Insulin-requiring or dependent type II diabetes mellitus ([place])   HIV disease ([place])   CKD (chronic kidney disease) stage 3, GFR 30-59 ml/min (HCC)    Subjective: Afebrile, still having foot pain,  Drainage is soaked through his bandage from I X D at bedside-late afternoon  Medications:  . abacavir-dolutegravir-lamiVUDine  1 tablet Oral Daily  . amLODipine  10 mg Oral Daily  . darunavir-cobicistat  1 tablet Oral Daily  . heparin injection (subcutaneous)  5,000 Units Subcutaneous Q8H  . insulin aspart  0-15 Units Subcutaneous TID WC  . insulin aspart  5 Units Subcutaneous TID WC  . insulin glargine  25 Units Subcutaneous Daily    Objective: Vital signs in last 24 hours: Temp:  [98.4 F (36.9 C)-98.8 F (37.1 C)] 98.6 F (37 C) (12/09 1414) Pulse Rate:  [92-106] 92 (12/09 1414) Resp:  [18-19] 18 (12/09 1414) BP: (138-148)/(80-89) 140/80 (12/09 1414) SpO2:  [98 %-100 %] 100 % (12/09 1414) Weight:  [257 lb 15 oz (117 kg)] 257 lb 15 oz (117 kg) (12/09 0553) Physical Exam  Constitutional: He is oriented to person, place, and time. He appears well-developed and well-nourished. No distress.  HENT:  Mouth/Throat: Oropharynx is clear and moist. No oropharyngeal exudate.  Cardiovascular: Normal rate, regular rhythm and normal heart sounds. Exam reveals no gallop and no friction rub.  No murmur heard.  Pulmonary/Chest: Effort normal and breath sounds normal. No respiratory distress. He has no wheezes.  Ext: is right foot has deep ulcer with exudate in the wound bed, plantar aspect of 1st mtp. Also  has superficial open/desquamation of skin on lateral aspect of foot foul drainage Skin: Skin is dry, scaly Psychiatric: He has a normal mood and affect. His behavior is normal.     Lab Results Recent Labs    03/15/17 0348 03/15/17 1008 03/16/17 0230  WBC 21.2*  --  16.5*  HGB 9.9*  --  8.8*  HCT 29.7*  --  26.8*  NA 126* 129* 136  K 4.2 3.6 3.5  CL 91* 97* 101  CO2 22 23 25   BUN 24* 23* 17  CREATININE 2.11* 1.91* 1.95*   Sedimentation Rate Recent Labs    03/15/17 1539  ESRSEDRATE 127*   C-Reactive Protein Recent Labs    03/15/17 1539  CRP 23.1*    Microbiology: polymicrobial Studies/Results: Mr Foot Right Wo Contrast  Result Date: 03/15/2017 CLINICAL DATA:  Right foot pain and swelling over the past few days in a diabetic patient. History of a skin ulceration on the foot for over a month. EXAM: MRI OF THE RIGHT FOREFOOT WITHOUT CONTRAST TECHNIQUE: Multiplanar, multisequence MR imaging of the right forefoot was performed. No intravenous contrast was administered. COMPARISON:  Plain films right foot 01/07/2017 scratch the MRI right foot 01/07/2017. Plain films right foot 01/06/2017 and earlier today. FINDINGS: Bones/Joint/Cartilage Marrow edema in the sesamoid bones of the first MTP joint is new since the prior MRI and much more intense  in the lateral sesamoid. Degenerative subchondral cyst at the third tarsometatarsal joint in the base the metatarsal is noted. Bone marrow signal is otherwise unremarkable. Ligaments Intact. Muscles and Tendons Edema throughout all intrinsic musculature of the foot is unchanged. Soft tissues Intense subcutaneous edema is present over the dorsum of the foot. An air and fluid collection centered just proximal to the lateral sesamoid measures approximately 1.2 cm transverse by 1 cm craniocaudal by 2.8 cm long and worrisome for abscess. This is new since the prior examination. Skin ulceration at the level of the head of the first metatarsal is again  seen. There appears to be blistering of the skin more proximally and laterally to the skin ulceration. IMPRESSION: New marrow edema in the medial and lateral sesamoids of the first MTP joint is more intense in the lateral sesamoid bone and consistent with osteomyelitis. Air and fluid collection in the plantar soft tissues centered just proximal to the lateral sesamoid of the first MTP joint is compatible with an abscess. Skin ulceration at the level of the head of the first metatarsal is identified. There appears to be a blister on the skin surface more proximally and laterally. Electronically Signed   By: Inge Rise M.D.   On: 03/15/2017 14:04   Dg Chest Port 1 View  Result Date: 03/15/2017 CLINICAL DATA:  Cough for 2 weeks EXAM: PORTABLE CHEST 1 VIEW COMPARISON:  03/29/2016 FINDINGS: Cardiac shadow is within normal limits. The lungs are well aerated bilaterally. Mild right basilar atelectasis is noted. No acute bony abnormality is seen. IMPRESSION: Mild right basilar atelectasis. Electronically Signed   By: Inez Catalina M.D.   On: 03/15/2017 20:59   Dg Foot Complete Right  Result Date: 03/14/2017 CLINICAL DATA:  51 year old male with open wound on the lateral side of the right foot at the base of the fifth metatarsal. History of diabetes. EXAM: RIGHT FOOT COMPLETE - 3+ VIEW COMPARISON:  Right foot MRI dated 01/07/2017 FINDINGS: There is no acute fracture or dislocation. The bones are well mineralized. Mild arthritic changes. No periosteal reaction or bone erosion. There is skin ulcer with soft tissue air in the plantar aspect of the forefoot at the level of the MTP joints. A second skin ulcer noted along the lateral aspect of the foot at the base of the fifth metatarsal. No soft tissue air noted adjacent to the this ulcer. There is diffuse soft tissue swelling of the foot. IMPRESSION: 1. No acute fracture or dislocation. No evidence of osteomyelitis by radiograph. MRI or a white blood cell nuclear  scan may provide better evaluation if there is high clinical concern for acute osteomyelitis. 2. Diffuse soft tissue swelling of the foot with ulcers and soft tissue air as described most consistent with cellulitis. Electronically Signed   By: Anner Crete M.D.   On: 03/14/2017 22:08     Assessment and plan: dfu/osteo of right foot  -continue with current iv abtx of daptomycin plus piptazo, await wound cx - ideally he needs debridement, if he goes to OR, would be helpful to have deep tissue culture - will need prolonged iv abtx to treat early osteo - may need BID dressing changes since he has considerable drainage of his wound-  Leukocytosis = appears improving  DM = poorly controlled. Recommend to re-evaluate his home regimen to see if any improvements can be made  hiv disease =continue on his home meds  Dr [name] to provider further [place], [facility] Infectious  Dise[facility] Cell: [phone] Pager: [phone]  03/16/2017, 3:18 PM

## 2017-03-17 ENCOUNTER — Inpatient Hospital Stay (HOSPITAL_COMMUNITY): Payer: Medicare Other

## 2017-03-17 DIAGNOSIS — L02611 Cutaneous abscess of right foot: Secondary | ICD-10-CM

## 2017-03-17 DIAGNOSIS — E119 Type 2 diabetes mellitus without complications: Secondary | ICD-10-CM

## 2017-03-17 DIAGNOSIS — I10 Essential (primary) hypertension: Secondary | ICD-10-CM

## 2017-03-17 DIAGNOSIS — N183 Chronic kidney disease, stage 3 (moderate): Secondary | ICD-10-CM

## 2017-03-17 DIAGNOSIS — B20 Human immunodeficiency virus [HIV] disease: Secondary | ICD-10-CM

## 2017-03-17 DIAGNOSIS — Z794 Long term (current) use of insulin: Secondary | ICD-10-CM

## 2017-03-17 DIAGNOSIS — L03115 Cellulitis of right lower limb: Secondary | ICD-10-CM

## 2017-03-17 LAB — BASIC METABOLIC PANEL
Anion gap: 12 (ref 5–15)
BUN: 11 mg/dL (ref 6–20)
CO2: 23 mmol/L (ref 22–32)
Calcium: 8.6 mg/dL — ABNORMAL LOW (ref 8.9–10.3)
Chloride: 99 mmol/L — ABNORMAL LOW (ref 101–111)
Creatinine, Ser: 1.71 mg/dL — ABNORMAL HIGH (ref 0.61–1.24)
GFR calc Af Amer: 51 mL/min — ABNORMAL LOW (ref >60)
GFR calc non Af Amer: 44 mL/min — ABNORMAL LOW (ref >60)
Glucose, Bld: 190 mg/dL — ABNORMAL HIGH (ref 65–99)
Potassium: 3.5 mmol/L (ref 3.5–5.1)
Sodium: 134 mmol/L — ABNORMAL LOW (ref 135–145)

## 2017-03-17 LAB — CBC
HCT: 29.2 % — ABNORMAL LOW (ref 39.0–52.0)
Hemoglobin: 9.6 g/dL — ABNORMAL LOW (ref 13.0–17.0)
MCH: 27.5 pg (ref 26.0–34.0)
MCHC: 32.9 g/dL (ref 30.0–36.0)
MCV: 83.7 fL (ref 78.0–100.0)
Platelets: 294 10*3/uL (ref 150–400)
RBC: 3.49 MIL/uL — ABNORMAL LOW (ref 4.22–5.81)
RDW: 13 % (ref 11.5–15.5)
WBC: 13.8 10*3/uL — ABNORMAL HIGH (ref 4.0–10.5)

## 2017-03-17 LAB — GLUCOSE, CAPILLARY
Glucose-Capillary: 200 mg/dL — ABNORMAL HIGH (ref 65–99)
Glucose-Capillary: 161 mg/dL — ABNORMAL HIGH (ref 65–99)
Glucose-Capillary: 156 mg/dL — ABNORMAL HIGH (ref 65–99)
Glucose-Capillary: 140 mg/dL — ABNORMAL HIGH (ref 65–99)
Glucose-Capillary: 110 mg/dL — ABNORMAL HIGH (ref 65–99)

## 2017-03-17 MED ORDER — SODIUM CHLORIDE 0.9 % IV SOLN
900.0000 mg | INTRAVENOUS | Status: DC
  Administered 2017-03-17: 900 mg via INTRAVENOUS
  Filled 2017-03-17 (×2): qty 18

## 2017-03-17 MED ORDER — INSULIN ASPART 100 UNIT/ML ~~LOC~~ SOLN
0.0000 [IU] | Freq: Every day | SUBCUTANEOUS | Status: DC
  Administered 2017-03-21: 3 [IU] via SUBCUTANEOUS
  Administered 2017-03-23: 2 [IU] via SUBCUTANEOUS

## 2017-03-17 MED ORDER — GLUCERNA SHAKE PO LIQD
237.0000 mL | Freq: Three times a day (TID) | ORAL | Status: DC
  Administered 2017-03-17 – 2017-03-23 (×13): 237 mL via ORAL

## 2017-03-17 MED ORDER — INSULIN ASPART 100 UNIT/ML ~~LOC~~ SOLN
0.0000 [IU] | Freq: Three times a day (TID) | SUBCUTANEOUS | Status: DC
  Administered 2017-03-17: 2 [IU] via SUBCUTANEOUS
  Administered 2017-03-17 (×2): 3 [IU] via SUBCUTANEOUS
  Administered 2017-03-18: 2 [IU] via SUBCUTANEOUS
  Administered 2017-03-18: 3 [IU] via SUBCUTANEOUS
  Administered 2017-03-18 – 2017-03-19 (×2): 2 [IU] via SUBCUTANEOUS
  Administered 2017-03-19 – 2017-03-20 (×4): 3 [IU] via SUBCUTANEOUS
  Administered 2017-03-21 – 2017-03-22 (×3): 5 [IU] via SUBCUTANEOUS
  Administered 2017-03-22 – 2017-03-23 (×2): 3 [IU] via SUBCUTANEOUS
  Administered 2017-03-23: 5 [IU] via SUBCUTANEOUS
  Administered 2017-03-23: 8 [IU] via SUBCUTANEOUS
  Administered 2017-03-24: 2 [IU] via SUBCUTANEOUS
  Administered 2017-03-24: 3 [IU] via SUBCUTANEOUS

## 2017-03-17 MED ORDER — INSULIN GLARGINE 100 UNIT/ML ~~LOC~~ SOLN
30.0000 [IU] | Freq: Every day | SUBCUTANEOUS | Status: DC
  Administered 2017-03-17 – 2017-03-22 (×6): 30 [IU] via SUBCUTANEOUS
  Filled 2017-03-17 (×7): qty 0.3

## 2017-03-17 MED ORDER — PRO-STAT SUGAR FREE PO LIQD
30.0000 mL | Freq: Two times a day (BID) | ORAL | Status: DC
  Administered 2017-03-17 (×2): 30 mL via ORAL
  Administered 2017-03-18: 09:00:00 via ORAL
  Administered 2017-03-18 – 2017-03-24 (×12): 30 mL via ORAL
  Filled 2017-03-17 (×15): qty 30

## 2017-03-17 NOTE — Progress Notes (Signed)
Inpatient Diabetes Program Recommendations  AACE/ADA: New Consensus Statement on Inpatient Glycemic Control (2015)  Target Ranges:  Prepandial:   less than 140 mg/dL      Peak postprandial:   less than 180 mg/dL (1-2 hours)      Critically ill patients:  140 - 180 mg/dL   Results for Keith [name], Keith [name] (MRN [id number]) as of 03/17/2017 11:46  Ref. Range 03/16/2017 08:21 03/16/2017 12:00 03/16/2017 17:04 03/16/2017 21:12 03/17/2017 08:06  Glucose-Capillary Latest Ref Range: 65 - 99 mg/dL 208 (H) 188 (H) 180 (H) 191 (H) 200 (H)   Review of Glycemic Control  Diabetes history: DM 2 Outpatient Diabetes medications: Lantus 20 units, Amaryl 1 mg Daily Current orders for Inpatient glycemic control: Lantus 30 units, Novolog Moderate Correction 0-15 units tid + Novolog HS scale 0-5 units + Novolog 5 units tid meal coverage  Inpatient Diabetes Program Recommendations:    Tried to speak with patient today regarding A1c increasing from 8.2% 01/01/17 up to 10.1% this admission. Patient very drowsy not speaking in syllables but grunting answers to my introductory questions. Will try to see tomorrow.  Watch on current regimen.  Thanks,  Tama Headings RN, MSN, [facility] Inpatient Diabetes Coordinator Team Pager [phone] (8a-5p)

## 2017-03-17 NOTE — Consult Note (Signed)
ORTHOPAEDIC CONSULTATION  REQUESTING PHYSICIAN: Dessa Phi, DO  Chief Complaint: Abscess ulceration right foot  HPI: Keith [name] is a 51 y.o. male who presents with diabetic insensate neuropathy who is status post debridement in the office several months ago.  Patient presents at this time with abscess over the base of the fifth metatarsal as well as a abscess and necrotic wound beneath the great toe first metatarsal head.  Past Medical History:  Diagnosis Date  . AIDS ([place])   . Chronic knee pain    right  . Chronic pain   . Diabetes mellitus   . Diabetes type 2, uncontrolled ([place])    HgA1c 17.6 (04/27/2010)  . Diabetic foot ulcer ([place]) 01/2017   right foot  . Erectile dysfunction   . Genital warts   . HIV (human immunodeficiency virus infection) ([place]) 2009   CD4 count 100, VL 13800 (05/01/2010)  . Hypertension   . Osteomyelitis [facility])    h/o hand   Past Surgical History:  Procedure Laterality Date  . HERNIA REPAIR    . I&D EXTREMITY Left 08/21/2014   Procedure: INCISION AND DRAINAGE LEFT SMALL FINGER;  Surgeon: Leanora Cover, MD;  Location: [place];  Service: Orthopedics;  Laterality: Left;  . MINOR IRRIGATION AND DEBRIDEMENT OF WOUND Right 04/22/2014   Procedure: IRRIGATION AND DEBRIDEMENT OF RIGHT NECK ABCESS;  Surgeon: Jerrell Belfast, MD;  Location: [facility];  Service: ENT;  Laterality: Right;  . MULTIPLE EXTRACTIONS WITH ALVEOLOPLASTY N/A 01/18/2013   Procedure: MULTIPLE EXTRACION 3, 6, 7, 10, 11, 13, 21, 22, 27, 28, 29, 30 WITH ALVEOLOPLASTY;  Surgeon: Gae Bon, DDS;  Location: [place];  Service: Oral Surgery;  Laterality: N/A;   Social History   Socioeconomic History  . Marital status: Single    Spouse name: None  . Number of children: 46  . Years of education: 25  . Highest education level: None  Social Needs  . Financial resource strain: None  . Food insecurity - worry: None  . Food insecurity - inability: None  . Transportation needs - medical: None   . Transportation needs - non-medical: None  Occupational History  . None  Tobacco Use  . Smoking status: Never Smoker  . Smokeless tobacco: Never Used  Substance and Sexual Activity  . Alcohol use: No    Alcohol/week: 0.0 oz  . Drug use: No  . Sexual activity: Yes    Partners: Female    Comment: given condoms  Other Topics Concern  . None  Social History Narrative   Worked for the city of [place] for 18 years.   Unemployed.    Applying for disability.   Medicaid patient.   Family History  Problem Relation Age of Onset  . Hypertension Mother   . Arthritis Father   . Hypertension Father   . Hypertension Brother   . Cancer Maternal Grandmother 53       unknown type of cancer  . Depression Paternal Grandmother    - negative except otherwise stated in the family history section Allergies  Allergen Reactions  . Sulfa Antibiotics Itching   Prior to Admission medications   Medication Sig Start Date End Date Taking? Authorizing Provider  abacavir-dolutegravir-lamiVUDine (TRIUMEQ) 600-50-300 MG tablet Take 1 tablet by mouth daily. 01/20/17  Yes [name], MD  amLODipine (NORVASC) 10 MG tablet Take 1 tablet (10 mg total) by mouth daily. 01/17/17  Yes Nita Sells, MD  cyclobenzaprine (FLEXERIL) 10 MG tablet Take 1 tablet (  10 mg total) by mouth 2 (two) times daily as needed for muscle spasms. 10/25/16  Yes Neese, [place], NP  darunavir-cobicistat (PREZCOBIX) 800-150 MG tablet Take 1 tablet by mouth daily. Swallow whole. Do NOT crush, break or chew tablets. Take with food. 01/20/17  Yes [name], MD  glimepiride (AMARYL) 1 MG tablet Take 1 tablet (1 mg total) by mouth every morning. 01/16/17 01/16/18 Yes Nita Sells, MD  Insulin Glargine (LANTUS SOLOSTAR) 100 UNIT/ML Solostar Pen Inject 20 Units into the skin daily at 10 pm. 01/16/17  Yes Nita Sells, MD  Insulin Syringes, Disposable, U-100 0.3 ML MISC For administration of insulin TID.  ICD E-11.9 10/29/16  Yes Scot Jun, FNP  LORazepam (ATIVAN) 1 MG tablet Take 1 tablet (1 mg total) by mouth every 6 (six) hours as needed for anxiety. 01/30/15  Yes [name], MD  naloxone [facility]) nasal spray 4 mg/0.1 mL Place 1 spray into the nose daily as needed (opioid overdose).   Yes [provider]  Oxycodone HCl 10 MG TABS Take 1 tablet (10 mg total) by mouth every 8 (eight) hours as needed (moderate or severe pain). 09/29/16  Yes Nita Sells, MD   Dg Chest Port 1 View  Result Date: 03/15/2017 CLINICAL DATA:  Cough for 2 weeks EXAM: PORTABLE CHEST 1 VIEW COMPARISON:  03/29/2016 FINDINGS: Cardiac shadow is within normal limits. The lungs are well aerated bilaterally. Mild right basilar atelectasis is noted. No acute bony abnormality is seen. IMPRESSION: Mild right basilar atelectasis. Electronically Signed   By: Inez Catalina M.D.   On: 03/15/2017 20:59   - pertinent xrays, CT, MRI studies were reviewed and independently interpreted  Positive ROS: All other systems have been reviewed and were otherwise negative with the exception of those mentioned in the HPI and as above.  Physical Exam: General: Alert, no acute distress Psychiatric: Patient is competent for consent with normal mood and affect Lymphatic: No axillary or cervical lymphadenopathy Cardiovascular: No pedal edema Respiratory: No cyanosis, no use of accessory musculature GI: No organomegaly, abdomen is soft and non-tender  Skin: Examination patient has a purulent abscess over the base of the fifth metatarsal right foot as well as a necrotic foul-smelling wound over the plantar aspect of the first metatarsal head.  There is necrotic tissue and foul-smelling drainage.   Neurologic: Patient does not have protective sensation bilateral lower extremities.   MUSCULOSKELETAL:  Patient has a strong dorsalis pedis pulse.  He has no ascending cellulitis.  The ulcer beneath the first metatarsal head  probes to tendon but not bone.  Review of the MRI scan shows increased uptake in the sesamoid bones consistent with osteomyelitis.  No increased uptake in the metatarsal or proximal phalanx.  Assessment: Assessment: Diabetic insensate neuropathy with large necrotic ulcer beneath the first metatarsal head and abscess over the base of the fifth metatarsal.  Plan: Plan: We will plan for surgery tomorrow Tuesday for debridement of the plantar ulcer beneath the first metatarsal head most likely excision of the medial and lateral sesamoids.  Debridement of the ulcer over the base of the fifth metatarsal.  Plan for application of an installation wound VAC with return to the operating room on Friday.  Thank you for the consult and the opportunity to see Keith [name], [place] [phone] 4:51 PM

## 2017-03-17 NOTE — H&P (View-Only) (Signed)
ORTHOPAEDIC CONSULTATION  REQUESTING PHYSICIAN: Dessa Phi, DO  Chief Complaint: Abscess ulceration right foot  HPI: Keith [name] is a 51 y.o. male who presents with diabetic insensate neuropathy who is status post debridement in the office several months ago.  Patient presents at this time with abscess over the base of the fifth metatarsal as well as a abscess and necrotic wound beneath the great toe first metatarsal head.  Past Medical History:  Diagnosis Date  . AIDS ([place])   . Chronic knee pain    right  . Chronic pain   . Diabetes mellitus   . Diabetes type 2, uncontrolled ([place])    HgA1c 17.6 (04/27/2010)  . Diabetic foot ulcer ([place]) 01/2017   right foot  . Erectile dysfunction   . Genital warts   . HIV (human immunodeficiency virus infection) ([place]) 2009   CD4 count 100, VL 13800 (05/01/2010)  . Hypertension   . Osteomyelitis [facility])    h/o hand   Past Surgical History:  Procedure Laterality Date  . HERNIA REPAIR    . I&D EXTREMITY Left 08/21/2014   Procedure: INCISION AND DRAINAGE LEFT SMALL FINGER;  Surgeon: Leanora Cover, MD;  Location: [place];  Service: Orthopedics;  Laterality: Left;  . MINOR IRRIGATION AND DEBRIDEMENT OF WOUND Right 04/22/2014   Procedure: IRRIGATION AND DEBRIDEMENT OF RIGHT NECK ABCESS;  Surgeon: Jerrell Belfast, MD;  Location: [place];  Service: ENT;  Laterality: Right;  . MULTIPLE EXTRACTIONS WITH ALVEOLOPLASTY N/A 01/18/2013   Procedure: MULTIPLE EXTRACION 3, 6, 7, 10, 11, 13, 21, 22, 27, 28, 29, 30 WITH ALVEOLOPLASTY;  Surgeon: Gae Bon, DDS;  Location: [place];  Service: Oral Surgery;  Laterality: N/A;   Social History   Socioeconomic History  . Marital status: Single    Spouse name: None  . Number of children: 66  . Years of education: 6  . Highest education level: None  Social Needs  . Financial resource strain: None  . Food insecurity - worry: None  . Food insecurity - inability: None  . Transportation needs - medical: None   . Transportation needs - non-medical: None  Occupational History  . None  Tobacco Use  . Smoking status: Never Smoker  . Smokeless tobacco: Never Used  Substance and Sexual Activity  . Alcohol use: No    Alcohol/week: 0.0 oz  . Drug use: No  . Sexual activity: Yes    Partners: Female    Comment: given condoms  Other Topics Concern  . None  Social History Narrative   Worked for the city of [place] for 18 years.   Unemployed.    Applying for disability.   Medicaid patient.   Family History  Problem Relation Age of Onset  . Hypertension Mother   . Arthritis Father   . Hypertension Father   . Hypertension Brother   . Cancer Maternal Grandmother 76       unknown type of cancer  . Depression Paternal Grandmother    - negative except otherwise stated in the family history section Allergies  Allergen Reactions  . Sulfa Antibiotics Itching   Prior to Admission medications   Medication Sig Start Date End Date Taking? Authorizing Provider  abacavir-dolutegravir-lamiVUDine (TRIUMEQ) 600-50-300 MG tablet Take 1 tablet by mouth daily. 01/20/17  Yes [name], MD  amLODipine (NORVASC) 10 MG tablet Take 1 tablet (10 mg total) by mouth daily. 01/17/17  Yes Nita Sells, MD  cyclobenzaprine (FLEXERIL) 10 MG tablet Take 1 tablet (  10 mg total) by mouth 2 (two) times daily as needed for muscle spasms. 10/25/16  Yes Neese, [place], NP  darunavir-cobicistat (PREZCOBIX) 800-150 MG tablet Take 1 tablet by mouth daily. Swallow whole. Do NOT crush, break or chew tablets. Take with food. 01/20/17  Yes [name], MD  glimepiride (AMARYL) 1 MG tablet Take 1 tablet (1 mg total) by mouth every morning. 01/16/17 01/16/18 Yes Nita Sells, MD  Insulin Glargine (LANTUS SOLOSTAR) 100 UNIT/ML Solostar Pen Inject 20 Units into the skin daily at 10 pm. 01/16/17  Yes Nita Sells, MD  Insulin Syringes, Disposable, U-100 0.3 ML MISC For administration of insulin TID.  ICD E-11.9 10/29/16  Yes Scot Jun, FNP  LORazepam (ATIVAN) 1 MG tablet Take 1 tablet (1 mg total) by mouth every 6 (six) hours as needed for anxiety. 01/30/15  Yes [name], MD  naloxone [facility]) nasal spray 4 mg/0.1 mL Place 1 spray into the nose daily as needed (opioid overdose).   Yes [provider]  Oxycodone HCl 10 MG TABS Take 1 tablet (10 mg total) by mouth every 8 (eight) hours as needed (moderate or severe pain). 09/29/16  Yes Nita Sells, MD   Dg Chest Port 1 View  Result Date: 03/15/2017 CLINICAL DATA:  Cough for 2 weeks EXAM: PORTABLE CHEST 1 VIEW COMPARISON:  03/29/2016 FINDINGS: Cardiac shadow is within normal limits. The lungs are well aerated bilaterally. Mild right basilar atelectasis is noted. No acute bony abnormality is seen. IMPRESSION: Mild right basilar atelectasis. Electronically Signed   By: Inez Catalina M.D.   On: 03/15/2017 20:59   - pertinent xrays, CT, MRI studies were reviewed and independently interpreted  Positive ROS: All other systems have been reviewed and were otherwise negative with the exception of those mentioned in the HPI and as above.  Physical Exam: General: Alert, no acute distress Psychiatric: Patient is competent for consent with normal mood and affect Lymphatic: No axillary or cervical lymphadenopathy Cardiovascular: No pedal edema Respiratory: No cyanosis, no use of accessory musculature GI: No organomegaly, abdomen is soft and non-tender  Skin: Examination patient has a purulent abscess over the base of the fifth metatarsal right foot as well as a necrotic foul-smelling wound over the plantar aspect of the first metatarsal head.  There is necrotic tissue and foul-smelling drainage.   Neurologic: Patient does not have protective sensation bilateral lower extremities.   MUSCULOSKELETAL:  Patient has a strong dorsalis pedis pulse.  He has no ascending cellulitis.  The ulcer beneath the first metatarsal head  probes to tendon but not bone.  Review of the MRI scan shows increased uptake in the sesamoid bones consistent with osteomyelitis.  No increased uptake in the metatarsal or proximal phalanx.  Assessment: Assessment: Diabetic insensate neuropathy with large necrotic ulcer beneath the first metatarsal head and abscess over the base of the fifth metatarsal.  Plan: Plan: We will plan for surgery tomorrow Tuesday for debridement of the plantar ulcer beneath the first metatarsal head most likely excision of the medial and lateral sesamoids.  Debridement of the ulcer over the base of the fifth metatarsal.  Plan for application of an installation wound VAC with return to the operating room on Friday.  Thank you for the consult and the opportunity to see Mr. Alias Villagran, [place] [phone] 4:51 PM

## 2017-03-17 NOTE — Progress Notes (Signed)
ABI's have been completed.  03/17/17 1:57 PM Keith [name] RVT

## 2017-03-17 NOTE — Care Management Note (Signed)
Case Management Note  Patient Details  Name: TIMOTH SCHARA MRN: [id number] Date of Birth: 04/12/1965 Subjective/Objective:          Admitted with Sepsis/ Abscess from right foot cellulitis and possible abscess.  Action/Plan:   Expected Discharge Date:                  Expected Discharge Plan:  Home/Self Care  In-House Referral:     Discharge planning Services  CM Consult  Post Acute Care Choice:    Choice offered to:     DME Arranged:    DME Agency:     HH Arranged:    HH Agency:     Status of Service:  In process, will continue to follow  If discussed at Long Length of Stay Meetings, dates discussed:    Additional Comments:  Sharin Mons, RN 03/17/2017, 6:09 PM

## 2017-03-17 NOTE — Progress Notes (Addendum)
INFECTIOUS DISEASE PROGRESS NOTE  ID: Keith [name] is a 51 y.o. male with  Principal Problem:   Sepsis ([place]) Active Problems:   Essential hypertension   Insulin-requiring or dependent type II diabetes mellitus ([place])   HIV disease ([place])   CKD (chronic kidney disease) stage 3, GFR 30-59 ml/min ([facility])  Subjective: Resting quietly   Abtx:  Anti-infectives (From admission, onward)   Start     Dose/Rate Route Frequency Ordered Stop   03/17/17 2000  DAPTOmycin (CUBICIN) 900 mg in sodium chloride 0.9 % IVPB     900 mg 236 mL/hr over 30 Minutes Intravenous Every 24 hours 03/17/17 1132     03/16/17 0800  vancomycin (VANCOCIN) 1,500 mg in sodium chloride 0.9 % 500 mL IVPB  Status:  Discontinued     1,500 mg 250 mL/hr over 120 Minutes Intravenous Every 24 hours 03/15/17 0651 03/15/17 0825   03/15/17 2000  DAPTOmycin (CUBICIN) 871 mg in sodium chloride 0.9 % IVPB  Status:  Discontinued     8 mg/kg  108.9 kg 234.8 mL/hr over 30 Minutes Intravenous Every 24 hours 03/15/17 1832 03/17/17 1132   03/15/17 1400  piperacillin-tazobactam (ZOSYN) IVPB 3.375 g     3.375 g 12.5 mL/hr over 240 Minutes Intravenous Every 8 hours 03/15/17 0651     03/15/17 1200  abacavir-dolutegravir-lamiVUDine (TRIUMEQ) 600-50-300 MG per tablet 1 tablet     1 tablet Oral Daily 03/15/17 0607     03/15/17 1200  darunavir-cobicistat (PREZCOBIX) 800-150 MG per tablet 1 tablet    Comments:  Swallow whole. Do NOT crush, break or chew tablets. Take with food.     1 tablet Oral Daily 03/15/17 0607     03/15/17 0900  linezolid (ZYVOX) tablet 600 mg     600 mg Oral  Once 03/15/17 0828 03/15/17 1052   03/15/17 0700  vancomycin (VANCOCIN) 500 mg in sodium chloride 0.9 % 100 mL IVPB  Status:  Discontinued     500 mg 100 mL/hr over 60 Minutes Intravenous  Once 03/15/17 0651 03/15/17 1013   03/15/17 0330  vancomycin (VANCOCIN) IVPB 1000 mg/200 mL premix     1,000 mg 200 mL/hr over 60 Minutes Intravenous  Once 03/15/17 0328  03/15/17 0644   03/15/17 0330  piperacillin-tazobactam (ZOSYN) IVPB 3.375 g     3.375 g 100 mL/hr over 30 Minutes Intravenous  Once 03/15/17 0328 03/15/17 0514      Medications:  Scheduled: . abacavir-dolutegravir-lamiVUDine  1 tablet Oral Daily  . amLODipine  10 mg Oral Daily  . darunavir-cobicistat  1 tablet Oral Daily  . feeding supplement (PRO-STAT SUGAR FREE 64)  30 mL Oral BID  . heparin injection (subcutaneous)  5,000 Units Subcutaneous Q8H  . insulin aspart  0-15 Units Subcutaneous TID WC  . insulin aspart  0-5 Units Subcutaneous QHS  . insulin aspart  5 Units Subcutaneous TID WC  . insulin glargine  30 Units Subcutaneous Daily    Objective: Vital signs in last 24 hours: Temp:  [99.5 F (37.5 C)-102.7 F (39.3 C)] 100.6 F (38.1 C) (12/10 0542) Pulse Rate:  [92-98] 94 (12/10 0542) Resp:  [16-20] 16 (12/10 0542) BP: (121-155)/(72-91) 155/89 (12/10 1116) SpO2:  [98 %-99 %] 98 % (12/10 0542)   General appearance: fatigued and no distress Incision/Wound: R foot dressed. Clean.   Lab Results Recent Labs    03/16/17 0230 03/17/17 0213  WBC 16.5* 13.8*  HGB 8.8* 9.6*  HCT 26.8* 29.2*  NA 136 134*  K  3.5 3.5  CL 101 99*  CO2 25 23  BUN 17 11  CREATININE 1.95* 1.71*   Liver Panel No results for input(s): PROT, ALBUMIN, AST, ALT, ALKPHOS, BILITOT, BILIDIR, IBILI in the last 72 hours. Sedimentation Rate Recent Labs    03/15/17 1539  ESRSEDRATE 127*   C-Reactive Protein Recent Labs    03/15/17 1539  CRP 23.1*    Microbiology: Recent Results (from the past 240 hour(s))  Wound or Superficial Culture     Status: None (Preliminary result)   Collection Time: 03/15/17  3:28 AM  Result Value Ref Range Status   Specimen Description WOUND RIGHT FOOT  Final   Special Requests NONE  Final   Gram Stain   Final    ABUNDANT WBC PRESENT, PREDOMINANTLY PMN ABUNDANT GRAM NEGATIVE RODS ABUNDANT GRAM POSITIVE COCCI    Culture   Final    RARE STAPHYLOCOCCUS  AUREUS RARE GROUP B STREP(S.AGALACTIAE)ISOLATED TESTING AGAINST S. AGALACTIAE NOT ROUTINELY PERFORMED DUE TO PREDICTABILITY OF AMP/PEN/VAN SUSCEPTIBILITY. WITHIN MIXED CULTURE    Report Status PENDING  Incomplete  Blood culture (routine x 2)     Status: None (Preliminary result)   Collection Time: 03/15/17  3:55 AM  Result Value Ref Range Status   Specimen Description BLOOD RIGHT ANTECUBITAL  Final   Special Requests   Final    BOTTLES DRAWN AEROBIC AND ANAEROBIC Blood Culture adequate volume   Culture NO GROWTH 2 DAYS  Final   Report Status PENDING  Incomplete  Blood culture (routine x 2)     Status: None (Preliminary result)   Collection Time: 03/15/17  4:00 AM  Result Value Ref Range Status   Specimen Description BLOOD LEFT ANTECUBITAL  Final   Special Requests   Final    BOTTLES DRAWN AEROBIC AND ANAEROBIC Blood Culture adequate volume   Culture NO GROWTH 2 DAYS  Final   Report Status PENDING  Incomplete  MRSA PCR Screening     Status: None   Collection Time: 03/15/17  2:45 PM  Result Value Ref Range Status   MRSA by PCR NEGATIVE NEGATIVE Final    Comment:        The GeneXpert MRSA Assay (FDA approved for NASAL specimens only), is one component of a comprehensive MRSA colonization surveillance program. It is not intended to diagnose MRSA infection nor to guide or monitor treatment for MRSA infections.     Studies/Results: Dg Chest Port 1 View  Result Date: 03/15/2017 CLINICAL DATA:  Cough for 2 weeks EXAM: PORTABLE CHEST 1 VIEW COMPARISON:  03/29/2016 FINDINGS: Cardiac shadow is within normal limits. The lungs are well aerated bilaterally. Mild right basilar atelectasis is noted. No acute bony abnormality is seen. IMPRESSION: Mild right basilar atelectasis. Electronically Signed   By: Inez Catalina M.D.   On: 03/15/2017 20:59     Assessment/Plan: HIV+ DM2 Diabetic foot infection (R 1st MT osteo and abscess) CKD 3  Total days of antibiotics: 4  (dapto/zosyn)  Await further ID of his 12-8 Cx Hopefully stop dapto soon.  Appreciate ortho f/u.  Glc 161-208 His WBC ad Cr have improved.  Temps variable No change in anbx for now.  Hopefully I & D.          Bobby Rumpf MD, FACP Infectious Diseases (pager) [phone] [email or web address] 03/17/2017, 2:24 PM  LOS: 2 days

## 2017-03-17 NOTE — Progress Notes (Signed)
PROGRESS NOTE    Keith [name]  XBM:[id number] DOB: 1965/09/09 DOA: 03/15/2017 PCP: Scot Jun, FNP     Brief Narrative:  Keith [name] a 51 y.o.malewithhistory of HIV, diabetes mellitus type 2, hypertension presents to the ER with complaints of increasing pain and swelling in the right foot for the last few days. Patient also noticed increasing discharge from the foot ulcer.Patient states he has been compliant with his anti-retroviral's and Lantus dose. On exam, he has a first metatarsal ulcer on plantar surface as well as skin laceration on lateral edge of foot. MRI right foot completed showed first MTP joint osteomyelitis as well as abscess. Orthopedic surgery was consulted as well as ID.   Assessment & Plan:   Principal Problem:   Sepsis ([facility]) Active Problems:   Essential hypertension   Insulin-requiring or dependent type II diabetes mellitus (HCC)   HIV disease ([place])   CKD (chronic kidney disease) stage 3, GFR 30-59 ml/min (HCC)   Sepsis secondary to osteomyelitis of right first MTP, DM wound  -Ortho and ID consulted -S/p incision and drainage by Dr. Louanne Skye 12/8. [name] to evaluate today  -Continue dapto/zosyn  -Await culture -Check ABI  -WBC trending down, fever overnight Tmax 102.7   DM type 2, uncontrolled with hyperglycemia -Ha1c 10.1 -DM coordinator consult  -Lantus, Novolog, SSI. Dose further adjusted today   HIV -Well controlled. Continue meds   AKI   -Baseline Cr 1 -Continue IVF. Slowly improving.   HTN -Continue norvasc     DVT prophylaxis: subq hep Code Status: full Family Communication: no family at bedside Disposition Plan: pending improvement   Consultants:   ID  Ortho  Procedures:   S/p incision and drainage by Dr. Louanne Skye 12/8   Antimicrobials:  Anti-infectives (From admission, onward)   Start     Dose/Rate Route Frequency Ordered Stop   03/16/17 0800  vancomycin (VANCOCIN) 1,500 mg in sodium chloride 0.9 % 500 mL  IVPB  Status:  Discontinued     1,500 mg 250 mL/hr over 120 Minutes Intravenous Every 24 hours 03/15/17 0651 03/15/17 0825   03/15/17 2000  DAPTOmycin (CUBICIN) 871 mg in sodium chloride 0.9 % IVPB     8 mg/kg  108.9 kg 234.8 mL/hr over 30 Minutes Intravenous Every 24 hours 03/15/17 1832     03/15/17 1400  piperacillin-tazobactam (ZOSYN) IVPB 3.375 g     3.375 g 12.5 mL/hr over 240 Minutes Intravenous Every 8 hours 03/15/17 0651     03/15/17 1200  abacavir-dolutegravir-lamiVUDine (TRIUMEQ) 600-50-300 MG per tablet 1 tablet     1 tablet Oral Daily 03/15/17 0607     03/15/17 1200  darunavir-cobicistat (PREZCOBIX) 800-150 MG per tablet 1 tablet    Comments:  Swallow whole. Do NOT crush, break or chew tablets. Take with food.     1 tablet Oral Daily 03/15/17 0607     03/15/17 0900  linezolid (ZYVOX) tablet 600 mg     600 mg Oral  Once 03/15/17 0828 03/15/17 1052   03/15/17 0700  vancomycin (VANCOCIN) 500 mg in sodium chloride 0.9 % 100 mL IVPB  Status:  Discontinued     500 mg 100 mL/hr over 60 Minutes Intravenous  Once 03/15/17 0651 03/15/17 1013   03/15/17 0330  vancomycin (VANCOCIN) IVPB 1000 mg/200 mL premix     1,000 mg 200 mL/hr over 60 Minutes Intravenous  Once 03/15/17 0328 03/15/17 0644   03/15/17 0330  piperacillin-tazobactam (ZOSYN) IVPB 3.375 g     3.375  g 100 mL/hr over 30 Minutes Intravenous  Once 03/15/17 0328 03/15/17 0514       Subjective: No new complaints. Fever overnight.   Objective: Vitals:   03/16/17 1414 03/16/17 2039 03/16/17 2115 03/17/17 0542  BP: 140/80 121/81 126/72 (!) 155/91  Pulse: 92 98 92 94  Resp: 18 19 20 16   Temp: 98.6 F (37 C) (!) 102.7 F (39.3 C) 99.5 F (37.5 C) (!) 100.6 F (38.1 C)  TempSrc: Oral Oral Oral Oral  SpO2: 100% 99% 98% 98%  Weight:      Height:        Intake/Output Summary (Last 24 hours) at 03/17/2017 1116 Last data filed at 03/17/2017 3244 Gross per 24 hour  Intake 50 ml  Output 2295 ml  Net -2245 ml    Filed Weights   03/14/17 2123 03/16/17 0553  Weight: 108.9 kg (240 lb) 117 kg (257 lb 15 oz)    Examination:  General exam: Appears calm and comfortable  Respiratory system: Clear to auscultation. Respiratory effort normal. Cardiovascular system: S1 & S2 heard, RRR. No JVD, murmurs, rubs, gallops or clicks. No pedal edema. Gastrointestinal system: Abdomen is nondistended, soft and nontender. No organomegaly or masses felt. Normal bowel sounds heard. Central nervous system: Alert and oriented. No focal neurological deficits. Extremities: Symmetric  Skin: +right foot with dressing in tact, dressing with serous fluid. Dressing change BID ordered  Psychiatry: Judgement and insight appear normal  Data Reviewed: I have personally reviewed following labs and imaging studies  CBC: Recent Labs  Lab 03/15/17 0348 03/16/17 0230 03/17/17 0213  WBC 21.2* 16.5* 13.8*  NEUTROABS 18.2*  --   --   HGB 9.9* 8.8* 9.6*  HCT 29.7* 26.8* 29.2*  MCV 84.9 83.5 83.7  PLT 252 244 010   Basic Metabolic Panel: Recent Labs  Lab 03/15/17 0348 03/15/17 1008 03/16/17 0230 03/17/17 0213  NA 126* 129* 136 134*  K 4.2 3.6 3.5 3.5  CL 91* 97* 101 99*  CO2 22 23 25 23   GLUCOSE 500* 360* 170* 190*  BUN 24* 23* 17 11  CREATININE 2.11* 1.91* 1.95* 1.71*  CALCIUM 8.9 8.2* 8.5* 8.6*   GFR: Estimated Creatinine Clearance: 63.8 mL/min (A) (by C-G formula based on SCr of 1.71 mg/dL (H)). Liver Function Tests: No results for input(s): AST, ALT, ALKPHOS, BILITOT, PROT, ALBUMIN in the last 168 hours. No results for input(s): LIPASE, AMYLASE in the last 168 hours. No results for input(s): AMMONIA in the last 168 hours. Coagulation Profile: No results for input(s): INR, PROTIME in the last 168 hours. Cardiac Enzymes: Recent Labs  Lab 03/16/17 1758  CKTOTAL 113   BNP (last 3 results) No results for input(s): PROBNP in the last 8760 hours. HbA1C: Recent Labs    03/15/17 1539  HGBA1C 10.1*    CBG: Recent Labs  Lab 03/16/17 0821 03/16/17 1200 03/16/17 1704 03/16/17 2112 03/17/17 0806  GLUCAP 208* 188* 180* 191* 200*   Lipid Profile: No results for input(s): CHOL, HDL, LDLCALC, TRIG, CHOLHDL, LDLDIRECT in the last 72 hours. Thyroid Function Tests: No results for input(s): TSH, T4TOTAL, FREET4, T3FREE, THYROIDAB in the last 72 hours. Anemia Panel: No results for input(s): VITAMINB12, FOLATE, FERRITIN, TIBC, IRON, RETICCTPCT in the last 72 hours. Sepsis Labs: Recent Labs  Lab 03/15/17 0400  LATICACIDVEN 1.23    Recent Results (from the past 240 hour(s))  Wound or Superficial Culture     Status: None (Preliminary result)   Collection Time: 03/15/17  3:28 AM  Result Value Ref Range Status   Specimen Description WOUND RIGHT FOOT  Final   Special Requests NONE  Final   Gram Stain   Final    ABUNDANT WBC PRESENT, PREDOMINANTLY PMN ABUNDANT GRAM NEGATIVE RODS ABUNDANT GRAM POSITIVE COCCI    Culture   Final    RARE STAPHYLOCOCCUS AUREUS RARE UNIDENTIFIED ORGANISM IDENTIFICATION AND SUSCEPTIBILITIES TO FOLLOW WITHIN MIXED CULTURE    Report Status PENDING  Incomplete  Blood culture (routine x 2)     Status: None (Preliminary result)   Collection Time: 03/15/17  3:55 AM  Result Value Ref Range Status   Specimen Description BLOOD RIGHT ANTECUBITAL  Final   Special Requests   Final    BOTTLES DRAWN AEROBIC AND ANAEROBIC Blood Culture adequate volume   Culture NO GROWTH 1 DAY  Final   Report Status PENDING  Incomplete  Blood culture (routine x 2)     Status: None (Preliminary result)   Collection Time: 03/15/17  4:00 AM  Result Value Ref Range Status   Specimen Description BLOOD LEFT ANTECUBITAL  Final   Special Requests   Final    BOTTLES DRAWN AEROBIC AND ANAEROBIC Blood Culture adequate volume   Culture NO GROWTH 1 DAY  Final   Report Status PENDING  Incomplete  MRSA PCR Screening     Status: None   Collection Time: 03/15/17  2:45 PM  Result Value Ref  Range Status   MRSA by PCR NEGATIVE NEGATIVE Final    Comment:        The GeneXpert MRSA Assay (FDA approved for NASAL specimens only), is one component of a comprehensive MRSA colonization surveillance program. It is not intended to diagnose MRSA infection nor to guide or monitor treatment for MRSA infections.        Radiology Studies: Mr Foot Right Wo Contrast  Result Date: 03/15/2017 CLINICAL DATA:  Right foot pain and swelling over the past few days in a diabetic patient. History of a skin ulceration on the foot for over a month. EXAM: MRI OF THE RIGHT FOREFOOT WITHOUT CONTRAST TECHNIQUE: Multiplanar, multisequence MR imaging of the right forefoot was performed. No intravenous contrast was administered. COMPARISON:  Plain films right foot 01/07/2017 scratch the MRI right foot 01/07/2017. Plain films right foot 01/06/2017 and earlier today. FINDINGS: Bones/Joint/Cartilage Marrow edema in the sesamoid bones of the first MTP joint is new since the prior MRI and much more intense in the lateral sesamoid. Degenerative subchondral cyst at the third tarsometatarsal joint in the base the metatarsal is noted. Bone marrow signal is otherwise unremarkable. Ligaments Intact. Muscles and Tendons Edema throughout all intrinsic musculature of the foot is unchanged. Soft tissues Intense subcutaneous edema is present over the dorsum of the foot. An air and fluid collection centered just proximal to the lateral sesamoid measures approximately 1.2 cm transverse by 1 cm craniocaudal by 2.8 cm long and worrisome for abscess. This is new since the prior examination. Skin ulceration at the level of the head of the first metatarsal is again seen. There appears to be blistering of the skin more proximally and laterally to the skin ulceration. IMPRESSION: New marrow edema in the medial and lateral sesamoids of the first MTP joint is more intense in the lateral sesamoid bone and consistent with osteomyelitis. Air and  fluid collection in the plantar soft tissues centered just proximal to the lateral sesamoid of the first MTP joint is compatible with an abscess. Skin ulceration at the level of the head of the  first metatarsal is identified. There appears to be a blister on the skin surface more proximally and laterally. Electronically Signed   By: Inge Rise M.D.   On: 03/15/2017 14:04   Dg Chest Port 1 View  Result Date: 03/15/2017 CLINICAL DATA:  Cough for 2 weeks EXAM: PORTABLE CHEST 1 VIEW COMPARISON:  03/29/2016 FINDINGS: Cardiac shadow is within normal limits. The lungs are well aerated bilaterally. Mild right basilar atelectasis is noted. No acute bony abnormality is seen. IMPRESSION: Mild right basilar atelectasis. Electronically Signed   By: Inez Catalina M.D.   On: 03/15/2017 20:59      Scheduled Meds: . abacavir-dolutegravir-lamiVUDine  1 tablet Oral Daily  . amLODipine  10 mg Oral Daily  . darunavir-cobicistat  1 tablet Oral Daily  . heparin injection (subcutaneous)  5,000 Units Subcutaneous Q8H  . insulin aspart  0-15 Units Subcutaneous TID WC  . insulin aspart  0-5 Units Subcutaneous QHS  . insulin aspart  5 Units Subcutaneous TID WC  . insulin glargine  30 Units Subcutaneous Daily   Continuous Infusions: . sodium chloride 125 mL/hr at 03/16/17 1413  . DAPTOmycin (CUBICIN)  IV Stopped (03/17/17 0326)  . piperacillin-tazobactam (ZOSYN)  IV 3.375 g (03/17/17 0553)     LOS: 2 days    Time spent: 30 minutes   Dessa Phi, DO [facility] [email or web address] Password [email or web address] 03/17/2017, 11:16 AM

## 2017-03-17 NOTE — Progress Notes (Signed)
Initial Nutrition Assessment  DOCUMENTATION CODES:   Obesity unspecified  INTERVENTION:   Pro-Stat BID  Glucerna Shake po TID, each supplement provides 220 kcal and 10 grams of protein, as pt currently not eating meal trays  Provide diet education on follow-up    NUTRITION DIAGNOSIS:   Increased nutrient needs related to wound healing as evidenced by estimated needs.  GOAL:   Patient will meet greater than or equal to 90% of their needs  MONITOR:   PO intake, Supplement acceptance, Labs, Weight trends  REASON FOR ASSESSMENT:   Consult Wound healing  ASSESSMENT:   51 yo male admitted with sepsis secondary to osteomyelitis of right first MTP, uncontrolled DM. Pt with hx of DM, HIV, CKD stage III  Pt leaving for vascular lab on visit today.   Per chart review, pt eating 0% of meals. Pt sleeping at breakfast, refused lunch tray.   No significant weight changes recently per weight encounters  Labs: sodium 134, Creatinine 1.71, BUN wdl, CBGs 138-208 Meds: NS at 125 ml/hr, lantus, ss novolog   NUTRITION - FOCUSED PHYSICAL EXAM:  Unable to assess  Diet Order:  Diet heart healthy/carb modified Room service appropriate? Yes; Fluid consistency: Thin  EDUCATION NEEDS:   Not appropriate for education at this time(Pt not available, readdress on follow)  Skin:  Skin Assessment: Skin Integrity Issues: Skin Integrity Issues:: Diabetic Ulcer  Last BM:  12/9  Height:   Ht Readings from Last 1 Encounters:  03/14/17 5\' 9"  (1.753 m)    Weight:   Wt Readings from Last 1 Encounters:  03/16/17 257 lb 15 oz (117 kg)    Ideal Body Weight:  72.7 kg  BMI:  Body mass index is 38.09 kg/m.  Estimated Nutritional Needs:   Kcal:  2200-2500 kcals  Protein:  117-125 g   Fluid:  >/= 2 L  Kerman Passey MS, RD, LDN, CNSC [phone] Pager  [phone] Weekend/On-Call Pager

## 2017-03-18 ENCOUNTER — Inpatient Hospital Stay (HOSPITAL_COMMUNITY): Payer: Medicare Other | Admitting: Certified Registered Nurse Anesthetist

## 2017-03-18 ENCOUNTER — Encounter (HOSPITAL_COMMUNITY)
Admission: EM | Disposition: A | Discharge status: Skilled Nursing Facility | Payer: Self-pay | Source: Home / Self Care | Attending: Internal Medicine

## 2017-03-18 ENCOUNTER — Encounter (HOSPITAL_COMMUNITY): Payer: Self-pay | Admitting: Certified Registered Nurse Anesthetist

## 2017-03-18 DIAGNOSIS — L02611 Cutaneous abscess of right foot: Secondary | ICD-10-CM

## 2017-03-18 HISTORY — PX: I & D EXTREMITY: SHX5045

## 2017-03-18 LAB — CBC
HCT: 29 % — ABNORMAL LOW (ref 39.0–52.0)
Hemoglobin: 9.6 g/dL — ABNORMAL LOW (ref 13.0–17.0)
MCH: 27.7 pg (ref 26.0–34.0)
MCHC: 33.1 g/dL (ref 30.0–36.0)
MCV: 83.8 fL (ref 78.0–100.0)
Platelets: 331 10*3/uL (ref 150–400)
RBC: 3.46 MIL/uL — ABNORMAL LOW (ref 4.22–5.81)
RDW: 12.8 % (ref 11.5–15.5)
WBC: 12.6 10*3/uL — ABNORMAL HIGH (ref 4.0–10.5)

## 2017-03-18 LAB — AEROBIC CULTURE W GRAM STAIN (SUPERFICIAL SPECIMEN)

## 2017-03-18 LAB — GLUCOSE, CAPILLARY
Glucose-Capillary: 125 mg/dL — ABNORMAL HIGH (ref 65–99)
Glucose-Capillary: 162 mg/dL — ABNORMAL HIGH (ref 65–99)
Glucose-Capillary: 175 mg/dL — ABNORMAL HIGH (ref 65–99)
Glucose-Capillary: 137 mg/dL — ABNORMAL HIGH (ref 65–99)
Glucose-Capillary: 136 mg/dL — ABNORMAL HIGH (ref 65–99)

## 2017-03-18 LAB — SURGICAL PCR SCREEN
MRSA, PCR: NEGATIVE
Staphylococcus aureus: NEGATIVE

## 2017-03-18 LAB — BASIC METABOLIC PANEL
Anion gap: 10 (ref 5–15)
BUN: 6 mg/dL (ref 6–20)
CO2: 28 mmol/L (ref 22–32)
Calcium: 8.6 mg/dL — ABNORMAL LOW (ref 8.9–10.3)
Chloride: 98 mmol/L — ABNORMAL LOW (ref 101–111)
Creatinine, Ser: 1.44 mg/dL — ABNORMAL HIGH (ref 0.61–1.24)
GFR calc Af Amer: >60 mL/min (ref >60)
GFR calc non Af Amer: 54 mL/min — ABNORMAL LOW (ref >60)
Glucose, Bld: 104 mg/dL — ABNORMAL HIGH (ref 65–99)
Potassium: 3.2 mmol/L — ABNORMAL LOW (ref 3.5–5.1)
Sodium: 136 mmol/L (ref 135–145)

## 2017-03-18 SURGERY — IRRIGATION AND DEBRIDEMENT EXTREMITY
Anesthesia: Monitor Anesthesia Care | Site: Foot | Laterality: Right

## 2017-03-18 MED ORDER — PROMETHAZINE HCL 25 MG/ML IJ SOLN: 6.2500 mg | INTRAMUSCULAR | Status: DC | PRN

## 2017-03-18 MED ORDER — ROCURONIUM BROMIDE 10 MG/ML (PF) SYRINGE
PREFILLED_SYRINGE | INTRAVENOUS | Status: AC
  Filled 2017-03-18: qty 5

## 2017-03-18 MED ORDER — POTASSIUM CHLORIDE CRYS ER 20 MEQ PO TBCR
20.0000 meq | EXTENDED_RELEASE_TABLET | Freq: Once | ORAL | Status: AC
Start: 2017-03-18 — End: 2017-03-18
  Administered 2017-03-18: 20 meq via ORAL
  Filled 2017-03-18: qty 1

## 2017-03-18 MED ORDER — FENTANYL CITRATE (PF) 100 MCG/2ML IJ SOLN
INTRAMUSCULAR | Status: DC | PRN
  Administered 2017-03-18 (×2): 50 ug via INTRAVENOUS

## 2017-03-18 MED ORDER — SUGAMMADEX SODIUM 200 MG/2ML IV SOLN
INTRAVENOUS | Status: AC
  Filled 2017-03-18: qty 2

## 2017-03-18 MED ORDER — PROPOFOL 10 MG/ML IV BOLUS
INTRAVENOUS | Status: AC
  Filled 2017-03-18: qty 40

## 2017-03-18 MED ORDER — CHLORHEXIDINE GLUCONATE 4 % EX LIQD
60.0000 mL | Freq: Once | CUTANEOUS | Status: AC
  Administered 2017-03-18: 4 via TOPICAL
  Filled 2017-03-18: qty 60

## 2017-03-18 MED ORDER — CEFAZOLIN SODIUM-DEXTROSE 2-4 GM/100ML-% IV SOLN
2.0000 g | INTRAVENOUS | Status: AC
  Administered 2017-03-18: 2 g via INTRAVENOUS
  Filled 2017-03-18: qty 100

## 2017-03-18 MED ORDER — MIDAZOLAM HCL 2 MG/2ML IJ SOLN
INTRAMUSCULAR | Status: AC
  Administered 2017-03-18: 1 mg
  Filled 2017-03-18: qty 2

## 2017-03-18 MED ORDER — MEPERIDINE HCL 25 MG/ML IJ SOLN: 6.2500 mg | INTRAMUSCULAR | Status: DC | PRN

## 2017-03-18 MED ORDER — FENTANYL CITRATE (PF) 100 MCG/2ML IJ SOLN: 25.0000 ug | INTRAMUSCULAR | Status: DC | PRN

## 2017-03-18 MED ORDER — FENTANYL CITRATE (PF) 100 MCG/2ML IJ SOLN
INTRAMUSCULAR | Status: AC
  Administered 2017-03-18: 50 ug
  Filled 2017-03-18: qty 2

## 2017-03-18 MED ORDER — SODIUM CHLORIDE 0.9 % IR SOLN
Status: DC | PRN
  Administered 2017-03-18: 1

## 2017-03-18 MED ORDER — POVIDONE-IODINE 10 % EX SWAB
2.0000 "application " | Freq: Once | CUTANEOUS | Status: AC
  Administered 2017-03-18: 2 via TOPICAL

## 2017-03-18 MED ORDER — MIDAZOLAM HCL 2 MG/2ML IJ SOLN
INTRAMUSCULAR | Status: DC | PRN
Start: 2017-03-18 — End: 2017-03-18
  Administered 2017-03-18 (×2): 1 mg via INTRAVENOUS

## 2017-03-18 MED ORDER — LACTATED RINGERS IV SOLN
INTRAVENOUS | Status: DC
  Administered 2017-03-18: 09:00:00 via INTRAVENOUS

## 2017-03-18 MED ORDER — ONDANSETRON HCL 4 MG/2ML IJ SOLN
INTRAMUSCULAR | Status: DC | PRN
  Administered 2017-03-18: 4 mg via INTRAVENOUS

## 2017-03-18 MED ORDER — ONDANSETRON HCL 4 MG/2ML IJ SOLN
INTRAMUSCULAR | Status: AC
  Filled 2017-03-18: qty 2

## 2017-03-18 MED ORDER — BUPIVACAINE-EPINEPHRINE (PF) 0.5% -1:200000 IJ SOLN
INTRAMUSCULAR | Status: DC | PRN
  Administered 2017-03-18 (×4): 5 mL via PERINEURAL

## 2017-03-18 MED ORDER — DEXAMETHASONE SODIUM PHOSPHATE 10 MG/ML IJ SOLN
INTRAMUSCULAR | Status: AC
  Filled 2017-03-18: qty 1

## 2017-03-18 MED ORDER — FENTANYL CITRATE (PF) 250 MCG/5ML IJ SOLN
INTRAMUSCULAR | Status: AC
  Filled 2017-03-18: qty 5

## 2017-03-18 MED ORDER — MIDAZOLAM HCL 2 MG/2ML IJ SOLN
INTRAMUSCULAR | Status: AC
  Filled 2017-03-18: qty 2

## 2017-03-18 MED ORDER — 0.9 % SODIUM CHLORIDE (POUR BTL) OPTIME
TOPICAL | Status: DC | PRN
  Administered 2017-03-18: 1000 mL

## 2017-03-18 MED ORDER — LIDOCAINE 2% (20 MG/ML) 5 ML SYRINGE
INTRAMUSCULAR | Status: AC
  Filled 2017-03-18: qty 5

## 2017-03-18 SURGICAL SUPPLY — 40 items
APL SKNCLS STERI-STRIP NONHPOA (GAUZE/BANDAGES/DRESSINGS) ×4
BANDAGE ESMARK 6X9 LF (GAUZE/BANDAGES/DRESSINGS) IMPLANT
BENZOIN TINCTURE PRP APPL 2/3 (GAUZE/BANDAGES/DRESSINGS) ×4 IMPLANT
BLADE SURG 21 STRL SS (BLADE) ×2 IMPLANT
BNDG CMPR 9X6 STRL LF SNTH (GAUZE/BANDAGES/DRESSINGS) ×1
BNDG COHESIVE 6X5 TAN STRL LF (GAUZE/BANDAGES/DRESSINGS) IMPLANT
BNDG ESMARK 6X9 LF (GAUZE/BANDAGES/DRESSINGS) ×2
BNDG GAUZE ELAST 4 BULKY (GAUZE/BANDAGES/DRESSINGS) ×4 IMPLANT
CASSETTE VERAFLO VERALINK (MISCELLANEOUS) ×1 IMPLANT
CONT SPEC 4OZ CLIKSEAL STRL BL (MISCELLANEOUS) ×1 IMPLANT
COVER SURGICAL LIGHT HANDLE (MISCELLANEOUS) ×4 IMPLANT
DRAPE INCISE IOBAN 66X45 STRL (DRAPES) ×3 IMPLANT
DRAPE U-SHAPE 47X51 STRL (DRAPES) ×2 IMPLANT
DRESSING VERAFLO CLEANSE CC (GAUZE/BANDAGES/DRESSINGS) IMPLANT
DRSG ADAPTIC 3X8 NADH LF (GAUZE/BANDAGES/DRESSINGS) ×2 IMPLANT
DRSG VERAFLO CLEANSE CC (GAUZE/BANDAGES/DRESSINGS) ×2
DURAPREP 26ML APPLICATOR (WOUND CARE) ×2 IMPLANT
ELECT REM PT RETURN 9FT ADLT (ELECTROSURGICAL)
ELECTRODE REM PT RTRN 9FT ADLT (ELECTROSURGICAL) IMPLANT
GAUZE SPONGE 4X4 12PLY STRL (GAUZE/BANDAGES/DRESSINGS) ×2 IMPLANT
GLOVE BIOGEL PI IND STRL 9 (GLOVE) ×1 IMPLANT
GLOVE BIOGEL PI INDICATOR 9 (GLOVE) ×1
GLOVE SURG ORTHO 9.0 STRL STRW (GLOVE) ×3 IMPLANT
GOWN STRL REUS W/ TWL XL LVL3 (GOWN DISPOSABLE) ×2 IMPLANT
GOWN STRL REUS W/TWL XL LVL3 (GOWN DISPOSABLE) ×4
HANDPIECE INTERPULSE COAX TIP (DISPOSABLE)
KIT BASIN OR (CUSTOM PROCEDURE TRAY) ×2 IMPLANT
KIT ROOM TURNOVER OR (KITS) ×2 IMPLANT
MANIFOLD NEPTUNE II (INSTRUMENTS) ×2 IMPLANT
NS IRRIG 1000ML POUR BTL (IV SOLUTION) ×2 IMPLANT
PACK ORTHO EXTREMITY (CUSTOM PROCEDURE TRAY) ×2 IMPLANT
PAD ARMBOARD 7.5X6 YLW CONV (MISCELLANEOUS) ×4 IMPLANT
SET HNDPC FAN SPRY TIP SCT (DISPOSABLE) IMPLANT
STOCKINETTE IMPERVIOUS 9X36 MD (GAUZE/BANDAGES/DRESSINGS) IMPLANT
SWAB COLLECTION DEVICE MRSA (MISCELLANEOUS) ×2 IMPLANT
SWAB CULTURE ESWAB REG 1ML (MISCELLANEOUS) IMPLANT
TOWEL OR 17X26 10 PK STRL BLUE (TOWEL DISPOSABLE) ×2 IMPLANT
TUBE CONNECTING 12X1/4 (SUCTIONS) ×2 IMPLANT
WND VAC CANISTER 500ML (MISCELLANEOUS) ×1 IMPLANT
YANKAUER SUCT BULB TIP NO VENT (SUCTIONS) ×2 IMPLANT

## 2017-03-18 NOTE — Progress Notes (Signed)
Nutrition Follow-up  DOCUMENTATION CODES:   Obesity unspecified  INTERVENTION:  Continue Glucerna Shake po TID, each supplement provides 220 kcal and 10 grams of protein  Continue Pro-Stat 47mL BID each supplement provides 100 calories and 15 grams of protein  NUTRITION DIAGNOSIS:   Increased nutrient needs related to wound healing as evidenced by estimated needs. -ongoing  GOAL:   Patient will meet greater than or equal to 90% of their needs -progressing  MONITOR:   PO intake, Supplement acceptance, Labs, Weight trends  ASSESSMENT:   51 yo male admitted with sepsis secondary to osteomyelitis of right first MTP, uncontrolled DM. Pt with hx of DM, HIV, CKD stage III  Followed up with patient today. He reports poor appetite x1 month with approximate 20 pound weight loss during that time. It appears his UBW fluctuates from 260-273 pounds, per chart current weight loss indicates 7.3% weight loss significant for time frame. Attempted to retrieve diet history but it was difficult, patient does not give a straight answer. He says that his PO fluctuates based on his appetite, but due to pain from his R foot he has eaten very little over the past month which continues into hospitalization. He is now s/p I&D, excision, decompression and application of wound vac. Breakfast meal was at bedside untouched during visit.  Meal completion 0%  Labs reviewed:  CBGs 175, 162 K+ 3.2   Medications reviewed and include:  Insulin NS at 167mL/hr, LR at 39mL/hr  RD provided diabetes education to patient as well.  Lab Results  Component Value Date   HGBA1C 10.1 (H) 03/15/2017    Discussed with patient the plate method to managing diabetes. He seemed to have a lot of trouble with understanding carbs and carb-counting  RD provided "Carbohydrate Counting for People with Diabetes" handout from the Academy of [employer]etics. Discussed different food groups and their effects on blood  sugar, emphasizing carbohydrate-containing foods. Provided list of carbohydrates and recommended serving sizes of common foods.  Discussed importance of controlled and consistent carbohydrate intake throughout the day. Provided examples of ways to balance meals/snacks and encouraged intake of high-fiber, whole grain complex carbohydrates. Teach back method used.  Expect fair compliance.   Intake/Output Summary (Last 24 hours) at 03/18/2017 1547 Last data filed at 03/18/2017 1400 Gross per 24 hour  Intake 6935.92 ml  Output 1685 ml  Net 5250.92 ml  7.2L fluid positive   NUTRITION - FOCUSED PHYSICAL EXAM:    Most Recent Value  Orbital Region  No depletion  Upper Arm Region  No depletion  Thoracic and Lumbar Region  No depletion  Buccal Region  Mild depletion  Temple Region  Mild depletion  Clavicle Bone Region  No depletion  Clavicle and Acromion Bone Region  No depletion  Scapular Bone Region  No depletion  Dorsal Hand  No depletion  Patellar Region  No depletion  Anterior Thigh Region  No depletion  Posterior Calf Region  No depletion  Edema (RD Assessment)  None  Hair  Reviewed  Eyes  Reviewed  Mouth  Reviewed  Skin  Reviewed  Nails  Reviewed       Diet Order:  Diet heart healthy/carb modified Room service appropriate? Yes; Fluid consistency: Thin Diet NPO time specified  EDUCATION NEEDS:   Not appropriate for education at this time(Pt not available, readdress on follow)  Skin:  Skin Assessment: Skin Integrity Issues: Skin Integrity Issues:: Diabetic Ulcer  Last BM:  12/9  Height:   Ht Readings from  Last 1 Encounters:  03/14/17 5\' 9"  (1.753 m)    Weight:   Wt Readings from Last 1 Encounters:  03/18/17 253 lb 12 oz (115.1 kg)    Ideal Body Weight:  72.7 kg  BMI:  Body mass index is 37.47 kg/m.  Estimated Nutritional Needs:   Kcal:  2200-2500 kcals  Protein:  117-125 g   Fluid:  >/= 2 L  Satira Anis. Jesicca Dipierro, MS, RD LDN Inpatient Clinical  Dietitian Pager [phone]

## 2017-03-18 NOTE — Transfer of Care (Signed)
Immediate Anesthesia Transfer of Care Note  Patient: Keith [name]  Procedure(s) Performed: IRRIGATION AND DEBRIDEMENT EXTREMITY (Right Foot)  Patient Location: PACU  Anesthesia Type:MAC  Level of Consciousness: awake, alert , oriented and patient cooperative  Airway & Oxygen Therapy: Patient Spontanous Breathing  Post-op Assessment: Report given to RN, Post -op Vital signs reviewed and stable and Patient moving all extremities X 4  Post vital signs: Reviewed and stable  Last Vitals:  Vitals:   03/18/17 1126 03/18/17 1130  BP: 131/83   Pulse: 82 83  Resp: 17 18  Temp:    SpO2: 98% 98%    Last Pain:  Vitals:   03/18/17 0900  TempSrc:   PainSc: 5       Patients Stated Pain Goal: 4 (64/33/29 5188)  Complications: No apparent anesthesia complications

## 2017-03-18 NOTE — Anesthesia Procedure Notes (Addendum)
Anesthesia Regional Block: Pectoralis block   Pre-Anesthetic Checklist: ,, timeout performed, Correct Patient, Correct Site, Correct Laterality, Correct Procedure, Correct Position, site marked, Risks and benefits discussed, Surgical consent,  Pre-op evaluation,  At surgeon's request  Laterality: Right and Lower  Prep: chloraprep       Needles:  Injection technique: Single-shot  Needle Type: Echogenic Stimulator Needle     Needle Length: 9cm  Needle Gauge: 21   Needle insertion depth: 2 cm   Additional Needles:   Procedures:,,,, ultrasound used (permanent image in chart),,,,  Narrative:  Start time: 03/18/2017 9:55 AM End time: 03/18/2017 10:02 AM Injection made incrementally with aspirations every 5 mL.  Performed by: Personally  Anesthesiologist: Lyn Hollingshead, MD

## 2017-03-18 NOTE — Progress Notes (Signed)
INFECTIOUS DISEASE PROGRESS NOTE  ID: Keith [name] is a 51 y.o. male with  Principal Problem:   Sepsis ([place]) Active Problems:   Essential hypertension   Insulin-requiring or dependent type II diabetes mellitus ([place])   HIV disease ([place])   CKD (chronic kidney disease) stage 3, GFR 30-59 ml/min (HCC)  Subjective: Sleeping post-op  Abtx:  Anti-infectives (From admission, onward)   Start     Dose/Rate Route Frequency Ordered Stop   03/18/17 0800  ceFAZolin (ANCEF) IVPB 2g/100 mL premix     2 g 200 mL/hr over 30 Minutes Intravenous To Short Stay 03/18/17 0005 03/18/17 1021   03/17/17 2000  DAPTOmycin (CUBICIN) 900 mg in sodium chloride 0.9 % IVPB     900 mg 236 mL/hr over 30 Minutes Intravenous Every 24 hours 03/17/17 1132     03/16/17 0800  vancomycin (VANCOCIN) 1,500 mg in sodium chloride 0.9 % 500 mL IVPB  Status:  Discontinued     1,500 mg 250 mL/hr over 120 Minutes Intravenous Every 24 hours 03/15/17 0651 03/15/17 0825   03/15/17 2000  DAPTOmycin (CUBICIN) 871 mg in sodium chloride 0.9 % IVPB  Status:  Discontinued     8 mg/kg  108.9 kg 234.8 mL/hr over 30 Minutes Intravenous Every 24 hours 03/15/17 1832 03/17/17 1132   03/15/17 1400  piperacillin-tazobactam (ZOSYN) IVPB 3.375 g     3.375 g 12.5 mL/hr over 240 Minutes Intravenous Every 8 hours 03/15/17 0651     03/15/17 1200  abacavir-dolutegravir-lamiVUDine (TRIUMEQ) 600-50-300 MG per tablet 1 tablet     1 tablet Oral Daily 03/15/17 0607     03/15/17 1200  darunavir-cobicistat (PREZCOBIX) 800-150 MG per tablet 1 tablet    Comments:  Swallow whole. Do NOT crush, break or chew tablets. Take with food.     1 tablet Oral Daily 03/15/17 0607     03/15/17 0900  linezolid (ZYVOX) tablet 600 mg     600 mg Oral  Once 03/15/17 0828 03/15/17 1052   03/15/17 0700  vancomycin (VANCOCIN) 500 mg in sodium chloride 0.9 % 100 mL IVPB  Status:  Discontinued     500 mg 100 mL/hr over 60 Minutes Intravenous  Once 03/15/17 0651 03/15/17  1013   03/15/17 0330  vancomycin (VANCOCIN) IVPB 1000 mg/200 mL premix     1,000 mg 200 mL/hr over 60 Minutes Intravenous  Once 03/15/17 0328 03/15/17 0644   03/15/17 0330  piperacillin-tazobactam (ZOSYN) IVPB 3.375 g     3.375 g 100 mL/hr over 30 Minutes Intravenous  Once 03/15/17 0328 03/15/17 0514      Medications:  Scheduled: . abacavir-dolutegravir-lamiVUDine  1 tablet Oral Daily  . amLODipine  10 mg Oral Daily  . darunavir-cobicistat  1 tablet Oral Daily  . feeding supplement (GLUCERNA SHAKE)  237 mL Oral TID BM  . feeding supplement (PRO-STAT SUGAR FREE 64)  30 mL Oral BID  . heparin injection (subcutaneous)  5,000 Units Subcutaneous Q8H  . insulin aspart  0-15 Units Subcutaneous TID WC  . insulin aspart  0-5 Units Subcutaneous QHS  . insulin aspart  5 Units Subcutaneous TID WC  . insulin glargine  30 Units Subcutaneous Daily    Objective: Vital signs in last 24 hours: Temp:  [97 F (36.1 C)-99.3 F (37.4 C)] 98.2 F (36.8 C) (12/11 1301) Pulse Rate:  [79-95] 94 (12/11 1301) Resp:  [15-19] 16 (12/11 1301) BP: (121-135)/(68-86) 135/76 (12/11 1301) SpO2:  [97 %-100 %] 99 % (12/11 1301) Weight:  [315.1  kg (253 lb 12 oz)] 115.1 kg (253 lb 12 oz) (12/11 0500)   General appearance: no distress Resp: clear to auscultation bilaterally Cardio: regular rate and rhythm GI: normal findings: bowel sounds normal and soft, non-tender Extremities: R foot dressed, VAC  Lab Results Recent Labs    03/17/17 0213 03/18/17 0223  WBC 13.8* 12.6*  HGB 9.6* 9.6*  HCT 29.2* 29.0*  NA 134* 136  K 3.5 3.2*  CL 99* 98*  CO2 23 28  BUN 11 6  CREATININE 1.71* 1.44*   Liver Panel No results for input(s): PROT, ALBUMIN, AST, ALT, ALKPHOS, BILITOT, BILIDIR, IBILI in the last 72 hours. Sedimentation Rate Recent Labs    03/15/17 1539  ESRSEDRATE 127*   C-Reactive Protein Recent Labs    03/15/17 1539  CRP 23.1*    Microbiology: Recent Results (from the past 240 hour(s))    Wound or Superficial Culture     Status: None   Collection Time: 03/15/17  3:28 AM  Result Value Ref Range Status   Specimen Description WOUND RIGHT FOOT  Final   Special Requests NONE  Final   Gram Stain   Final    ABUNDANT WBC PRESENT, PREDOMINANTLY PMN ABUNDANT GRAM NEGATIVE RODS ABUNDANT GRAM POSITIVE COCCI    Culture   Final    RARE STAPHYLOCOCCUS AUREUS RARE GROUP B STREP(S.AGALACTIAE)ISOLATED TESTING AGAINST S. AGALACTIAE NOT ROUTINELY PERFORMED DUE TO PREDICTABILITY OF AMP/PEN/VAN SUSCEPTIBILITY. WITHIN MIXED CULTURE    Report Status 03/18/2017 FINAL  Final   Organism ID, Bacteria STAPHYLOCOCCUS AUREUS  Final      Susceptibility   Staphylococcus aureus - MIC*    CIPROFLOXACIN <=0.5 SENSITIVE Sensitive     ERYTHROMYCIN >=8 RESISTANT Resistant     GENTAMICIN <=0.5 SENSITIVE Sensitive     OXACILLIN <=0.25 SENSITIVE Sensitive     TETRACYCLINE <=1 SENSITIVE Sensitive     VANCOMYCIN <=0.5 SENSITIVE Sensitive     TRIMETH/SULFA <=10 SENSITIVE Sensitive     CLINDAMYCIN RESISTANT Resistant     RIFAMPIN <=0.5 SENSITIVE Sensitive     Inducible Clindamycin POSITIVE Resistant     * RARE STAPHYLOCOCCUS AUREUS  Blood culture (routine x 2)     Status: None (Preliminary result)   Collection Time: 03/15/17  3:55 AM  Result Value Ref Range Status   Specimen Description BLOOD RIGHT ANTECUBITAL  Final   Special Requests   Final    BOTTLES DRAWN AEROBIC AND ANAEROBIC Blood Culture adequate volume   Culture NO GROWTH 3 DAYS  Final   Report Status PENDING  Incomplete  Blood culture (routine x 2)     Status: None (Preliminary result)   Collection Time: 03/15/17  4:00 AM  Result Value Ref Range Status   Specimen Description BLOOD LEFT ANTECUBITAL  Final   Special Requests   Final    BOTTLES DRAWN AEROBIC AND ANAEROBIC Blood Culture adequate volume   Culture NO GROWTH 3 DAYS  Final   Report Status PENDING  Incomplete  MRSA PCR Screening     Status: None   Collection Time: 03/15/17  2:45  PM  Result Value Ref Range Status   MRSA by PCR NEGATIVE NEGATIVE Final    Comment:        The GeneXpert MRSA Assay (FDA approved for NASAL specimens only), is one component of a comprehensive MRSA colonization surveillance program. It is not intended to diagnose MRSA infection nor to guide or monitor treatment for MRSA infections.   Surgical pcr screen     Status: None  Collection Time: 03/18/17  1:21 AM  Result Value Ref Range Status   MRSA, PCR NEGATIVE NEGATIVE Final   Staphylococcus aureus NEGATIVE NEGATIVE Final    Comment: (NOTE) The Xpert SA Assay (FDA approved for NASAL specimens in patients 39 years of age and older), is one component of a comprehensive surveillance program. It is not intended to diagnose infection nor to guide or monitor treatment.   Aerobic/Anaerobic Culture (surgical/deep wound)     Status: None (Preliminary result)   Collection Time: 03/18/17 10:23 AM  Result Value Ref Range Status   Specimen Description TISSUE RIGHT FOOT  Final   Special Requests NONE  Final   Gram Stain   Final    FEW WBC PRESENT, PREDOMINANTLY PMN NO ORGANISMS SEEN    Culture PENDING  Incomplete   Report Status PENDING  Incomplete    Studies/Results: No results found.   Assessment/Plan: HIV+ DM2 Diabetic foot infection (R 1st MT osteo and abscess) CKD 3  Total days of antibiotics: 5 (dapto/zosyn)  His 12-8 Cx ( GBS and MSSA)- will stop dapto.  Had I & D today, removal of 5 x 11 cm abscess. Await further Cx, further narrow anbx.  Glc 110-175 His WBC ad Cr continue to improve.  Temps improved          Bobby Rumpf MD, FACP Infectious Diseases (pager) [phone] [email or web address] 03/18/2017, 3:27 PM  LOS: 3 days

## 2017-03-18 NOTE — Anesthesia Preprocedure Evaluation (Addendum)
Anesthesia Evaluation  Patient identified by MRN, date of birth, ID band Patient awake    Reviewed: Allergy & Precautions, NPO status , Patient's Chart, lab work & pertinent test results  Airway Mallampati: II  TM Distance: >3 FB Neck ROM: Full    Dental  (+) Poor Dentition   Pulmonary neg pulmonary ROS,    Pulmonary exam normal breath sounds clear to auscultation       Cardiovascular hypertension, Pt. on medications + Peripheral Vascular Disease  Normal cardiovascular exam Rhythm:Regular Rate:Normal     Neuro/Psych  Neuromuscular disease negative psych ROS   GI/Hepatic negative GI ROS, Neg liver ROS,   Endo/Other  diabetes, Type 2, Insulin Dependent  Renal/GU CRFRenal disease     Musculoskeletal   Abdominal (+) + obese,   Peds  Hematology  (+) Blood dyscrasia, anemia ,   Anesthesia Other Findings HIV-AIDS  Reproductive/Obstetrics                             Anesthesia Physical  Anesthesia Plan  ASA: III  Anesthesia Plan: MAC and Regional   Post-op Pain Management:    Induction: Intravenous  PONV Risk Score and Plan: 1  Airway Management Planned: Natural Airway and Nasal Cannula  Additional Equipment: None  Intra-op Plan:   Post-operative Plan:   Informed Consent: I have reviewed the patients History and Physical, chart, labs and discussed the procedure including the risks, benefits and alternatives for the proposed anesthesia with the patient or authorized representative who has indicated his/her understanding and acceptance.   Dental advisory given  Plan Discussed with: CRNA and Surgeon  Anesthesia Plan Comments:         Anesthesia Quick Evaluation

## 2017-03-18 NOTE — Progress Notes (Signed)
PROGRESS NOTE    Keith [name]  NTI:[id number] DOB: 1965-11-24 DOA: 03/15/2017 PCP: Scot Jun, FNP     Brief Narrative:  Keith [name] a 51 y.o.malewithhistory of HIV, diabetes mellitus type 2, hypertension presents to the ER with complaints of increasing pain and swelling in the right foot for the last few days. Patient also noticed increasing discharge from the foot ulcer.Patient states he has been compliant with his anti-retroviral's and Lantus dose. On exam, he has a first metatarsal ulcer on plantar surface as well as skin laceration on lateral edge of foot. MRI right foot completed showed first MTP joint osteomyelitis as well as abscess. Orthopedic surgery was consulted as well as ID.   Assessment & Plan:   Principal Problem:   Sepsis ([place]) Active Problems:   Essential hypertension   Insulin-requiring or dependent type II diabetes mellitus (HCC)   HIV disease (HCC)   CKD (chronic kidney disease) stage 3, GFR 30-59 ml/min (HCC)   Sepsis secondary to osteomyelitis of right first MTP, DM wound  -Ortho and ID consulted -ABI normal  -S/p incision and drainage by Dr. Louanne Skye 12/8 -S/p irrigation and debridement, excision with decompression of abscess of sesamoid bone, wound vac placement by [name] 12/11  -Continue dapto/zosyn  -Wound culture pending  -[name] planning for return to OR on Friday for repeat debridement   DM type 2, uncontrolled with hyperglycemia -Ha1c 10.1 -DM coordinator consult  -Lantus, Novolog, SSI  HIV -Well controlled. Continue meds   AKI   -Baseline Cr 1 -Continue IVF. Slowly improving.   HTN -Continue norvasc   Hypokalemia -Replace, trend     DVT prophylaxis: subq hep Code Status: full Family Communication: no family at bedside Disposition Plan: pending improvement   Consultants:   ID  Ortho  Procedures:   S/p incision and drainage by Dr. Louanne Skye 12/8   S/p irrigation and debridement, excision with  decompression of abscess of sesamoid bone, wound vac placement by [name] 12/11   Antimicrobials:  Anti-infectives (From admission, onward)   Start     Dose/Rate Route Frequency Ordered Stop   03/18/17 0800  ceFAZolin (ANCEF) IVPB 2g/100 mL premix     2 g 200 mL/hr over 30 Minutes Intravenous To Short Stay 03/18/17 0005 03/18/17 1021   03/17/17 2000  [MAR Hold]  DAPTOmycin (CUBICIN) 900 mg in sodium chloride 0.9 % IVPB     (MAR Hold since 03/18/17 0920)   900 mg 236 mL/hr over 30 Minutes Intravenous Every 24 hours 03/17/17 1132     03/16/17 0800  vancomycin (VANCOCIN) 1,500 mg in sodium chloride 0.9 % 500 mL IVPB  Status:  Discontinued     1,500 mg 250 mL/hr over 120 Minutes Intravenous Every 24 hours 03/15/17 0651 03/15/17 0825   03/15/17 2000  DAPTOmycin (CUBICIN) 871 mg in sodium chloride 0.9 % IVPB  Status:  Discontinued     8 mg/kg  108.9 kg 234.8 mL/hr over 30 Minutes Intravenous Every 24 hours 03/15/17 1832 03/17/17 1132   03/15/17 1400  [MAR Hold]  piperacillin-tazobactam (ZOSYN) IVPB 3.375 g     (MAR Hold since 03/18/17 0920)   3.375 g 12.5 mL/hr over 240 Minutes Intravenous Every 8 hours 03/15/17 0651     03/15/17 1200  [MAR Hold]  abacavir-dolutegravir-lamiVUDine (TRIUMEQ) 600-50-300 MG per tablet 1 tablet     (MAR Hold since 03/18/17 0920)   1 tablet Oral Daily 03/15/17 0607     03/15/17 1200  [MAR Hold]  darunavir-cobicistat (  PREZCOBIX) 800-150 MG per tablet 1 tablet     (MAR Hold since 03/18/17 0920)  Comments:  Swallow whole. Do NOT crush, break or chew tablets. Take with food.     1 tablet Oral Daily 03/15/17 0607     03/15/17 0900  linezolid (ZYVOX) tablet 600 mg     600 mg Oral  Once 03/15/17 0828 03/15/17 1052   03/15/17 0700  vancomycin (VANCOCIN) 500 mg in sodium chloride 0.9 % 100 mL IVPB  Status:  Discontinued     500 mg 100 mL/hr over 60 Minutes Intravenous  Once 03/15/17 0651 03/15/17 1013   03/15/17 0330  vancomycin (VANCOCIN) IVPB 1000 mg/200 mL premix      1,000 mg 200 mL/hr over 60 Minutes Intravenous  Once 03/15/17 0328 03/15/17 0644   03/15/17 0330  piperacillin-tazobactam (ZOSYN) IVPB 3.375 g     3.375 g 100 mL/hr over 30 Minutes Intravenous  Once 03/15/17 0328 03/15/17 0514       Subjective: No new complaints. Planning for OR today.    Objective: Vitals:   03/18/17 1000 03/18/17 1005 03/18/17 1058 03/18/17 1111  BP: 130/80  124/78 129/78  Pulse: 80 79 84 84  Resp: 17 19 15 16   Temp:   (!) 97 F (36.1 C)   TempSrc:      SpO2: 97% 99% 98% 98%  Weight:      Height:        Intake/Output Summary (Last 24 hours) at 03/18/2017 1125 Last data filed at 03/18/2017 1047 Gross per 24 hour  Intake 4075.42 ml  Output 1910 ml  Net 2165.42 ml   Filed Weights   03/14/17 2123 03/16/17 0553 03/18/17 0500  Weight: 108.9 kg (240 lb) 117 kg (257 lb 15 oz) 115.1 kg (253 lb 12 oz)    Examination:  General exam: Appears calm and comfortable  Respiratory system: Clear to auscultation. Respiratory effort normal. Cardiovascular system: S1 & S2 heard, RRR. No JVD, murmurs, rubs, gallops or clicks. No pedal edema. Gastrointestinal system: Abdomen is nondistended, soft and nontender. No organomegaly or masses felt. Normal bowel sounds heard. Central nervous system: Alert and oriented. No focal neurological deficits. Extremities: Symmetric  Skin: +right foot with dressing in tact, dressing with serous fluid. Dressing change BID ordered  Psychiatry: Judgement and insight appear normal  Data Reviewed: I have personally reviewed following labs and imaging studies  CBC: Recent Labs  Lab 03/15/17 0348 03/16/17 0230 03/17/17 0213 03/18/17 0223  WBC 21.2* 16.5* 13.8* 12.6*  NEUTROABS 18.2*  --   --   --   HGB 9.9* 8.8* 9.6* 9.6*  HCT 29.7* 26.8* 29.2* 29.0*  MCV 84.9 83.5 83.7 83.8  PLT 252 244 294 132   Basic Metabolic Panel: Recent Labs  Lab 03/15/17 0348 03/15/17 1008 03/16/17 0230 03/17/17 0213 03/18/17 0223  NA 126* 129* 136  134* 136  K 4.2 3.6 3.5 3.5 3.2*  CL 91* 97* 101 99* 98*  CO2 22 23 25 23 28   GLUCOSE 500* 360* 170* 190* 104*  BUN 24* 23* 17 11 6   CREATININE 2.11* 1.91* 1.95* 1.71* 1.44*  CALCIUM 8.9 8.2* 8.5* 8.6* 8.6*   GFR: Estimated Creatinine Clearance: 75.1 mL/min (A) (by C-G formula based on SCr of 1.44 mg/dL (H)). Liver Function Tests: No results for input(s): AST, ALT, ALKPHOS, BILITOT, PROT, ALBUMIN in the last 168 hours. No results for input(s): LIPASE, AMYLASE in the last 168 hours. No results for input(s): AMMONIA in the last 168 hours. Coagulation  Profile: No results for input(s): INR, PROTIME in the last 168 hours. Cardiac Enzymes: Recent Labs  Lab 03/16/17 1758  CKTOTAL 113   BNP (last 3 results) No results for input(s): PROBNP in the last 8760 hours. HbA1C: Recent Labs    03/15/17 1539  HGBA1C 10.1*   CBG: Recent Labs  Lab 03/17/17 1435 03/17/17 1719 03/17/17 2158 03/18/17 0828 03/18/17 1056  GLUCAP 156* 140* 110* 125* 162*   Lipid Profile: No results for input(s): CHOL, HDL, LDLCALC, TRIG, CHOLHDL, LDLDIRECT in the last 72 hours. Thyroid Function Tests: No results for input(s): TSH, T4TOTAL, FREET4, T3FREE, THYROIDAB in the last 72 hours. Anemia Panel: No results for input(s): VITAMINB12, FOLATE, FERRITIN, TIBC, IRON, RETICCTPCT in the last 72 hours. Sepsis Labs: Recent Labs  Lab 03/15/17 0400  LATICACIDVEN 1.23    Recent Results (from the past 240 hour(s))  Wound or Superficial Culture     Status: None (Preliminary result)   Collection Time: 03/15/17  3:28 AM  Result Value Ref Range Status   Specimen Description WOUND RIGHT FOOT  Final   Special Requests NONE  Final   Gram Stain   Final    ABUNDANT WBC PRESENT, PREDOMINANTLY PMN ABUNDANT GRAM NEGATIVE RODS ABUNDANT GRAM POSITIVE COCCI    Culture   Final    RARE STAPHYLOCOCCUS AUREUS RARE GROUP B STREP(S.AGALACTIAE)ISOLATED TESTING AGAINST S. AGALACTIAE NOT ROUTINELY PERFORMED DUE TO  PREDICTABILITY OF AMP/PEN/VAN SUSCEPTIBILITY. WITHIN MIXED CULTURE    Report Status PENDING  Incomplete  Blood culture (routine x 2)     Status: None (Preliminary result)   Collection Time: 03/15/17  3:55 AM  Result Value Ref Range Status   Specimen Description BLOOD RIGHT ANTECUBITAL  Final   Special Requests   Final    BOTTLES DRAWN AEROBIC AND ANAEROBIC Blood Culture adequate volume   Culture NO GROWTH 2 DAYS  Final   Report Status PENDING  Incomplete  Blood culture (routine x 2)     Status: None (Preliminary result)   Collection Time: 03/15/17  4:00 AM  Result Value Ref Range Status   Specimen Description BLOOD LEFT ANTECUBITAL  Final   Special Requests   Final    BOTTLES DRAWN AEROBIC AND ANAEROBIC Blood Culture adequate volume   Culture NO GROWTH 2 DAYS  Final   Report Status PENDING  Incomplete  MRSA PCR Screening     Status: None   Collection Time: 03/15/17  2:45 PM  Result Value Ref Range Status   MRSA by PCR NEGATIVE NEGATIVE Final    Comment:        The GeneXpert MRSA Assay (FDA approved for NASAL specimens only), is one component of a comprehensive MRSA colonization surveillance program. It is not intended to diagnose MRSA infection nor to guide or monitor treatment for MRSA infections.   Surgical pcr screen     Status: None   Collection Time: 03/18/17  1:21 AM  Result Value Ref Range Status   MRSA, PCR NEGATIVE NEGATIVE Final   Staphylococcus aureus NEGATIVE NEGATIVE Final    Comment: (NOTE) The Xpert SA Assay (FDA approved for NASAL specimens in patients 19 years of age and older), is one component of a comprehensive surveillance program. It is not intended to diagnose infection nor to guide or monitor treatment.        Radiology Studies: No results found.    Scheduled Meds: . [MAR Hold] abacavir-dolutegravir-lamiVUDine  1 tablet Oral Daily  . [MAR Hold] amLODipine  10 mg Oral Daily  . [  MAR Hold] darunavir-cobicistat  1 tablet Oral Daily  .  [MAR Hold] feeding supplement (GLUCERNA SHAKE)  237 mL Oral TID BM  . [MAR Hold] feeding supplement (PRO-STAT SUGAR FREE 64)  30 mL Oral BID  . [MAR Hold] heparin injection (subcutaneous)  5,000 Units Subcutaneous Q8H  . [MAR Hold] insulin aspart  0-15 Units Subcutaneous TID WC  . [MAR Hold] insulin aspart  0-5 Units Subcutaneous QHS  . [MAR Hold] insulin aspart  5 Units Subcutaneous TID WC  . [MAR Hold] insulin glargine  30 Units Subcutaneous Daily   Continuous Infusions: . sodium chloride 125 mL/hr at 03/16/17 1413  . [MAR Hold] DAPTOmycin (CUBICIN)  IV Stopped (03/17/17 2018)  . lactated ringers 10 mL/hr at 03/18/17 0926  . [MAR Hold] piperacillin-tazobactam (ZOSYN)  IV Stopped (03/18/17 0903)     LOS: 3 days    Time spent: 30 minutes   Dessa Phi, DO [facility] [email or web address] Password [email or web address] 03/18/2017, 11:25 AM

## 2017-03-18 NOTE — Interval H&P Note (Signed)
History and Physical Interval Note:  03/18/2017 9:57 AM  Keith [name]  has presented today for surgery, with the diagnosis of infected right foot  The various methods of treatment have been discussed with the patient and family. After consideration of risks, benefits and other options for treatment, the patient has consented to  Procedure(s): IRRIGATION AND DEBRIDEMENT EXTREMITY (Right) as a surgical intervention .  The patient's history has been reviewed, patient examined, no change in status, stable for surgery.  I have reviewed the patient's chart and labs.  Questions were answered to the patient's satisfaction.     Keith [name]

## 2017-03-18 NOTE — Anesthesia Postprocedure Evaluation (Signed)
Anesthesia Post Note  Patient: Keith [name]  Procedure(s) Performed: IRRIGATION AND DEBRIDEMENT EXTREMITY (Right Foot)     Patient location during evaluation: PACU Anesthesia Type: Regional Level of consciousness: awake Pain management: pain level controlled Vital Signs Assessment: post-procedure vital signs reviewed and stable Respiratory status: spontaneous breathing Cardiovascular status: stable Postop Assessment: no apparent nausea or vomiting Anesthetic complications: no    Last Vitals:  Vitals:   03/18/17 1146 03/18/17 1200  BP:  121/82  Pulse: 81 82  Resp: 16 16  Temp: (!) 36.1 C   SpO2: 99% 99%    Last Pain:  Vitals:   03/18/17 0900  TempSrc:   PainSc: 5    Pain Goal: Patients Stated Pain Goal: 4 (03/16/17 2307)               [place]

## 2017-03-18 NOTE — Op Note (Signed)
03/18/2017  10:57 AM  PATIENT:  Keith [name]    PRE-OPERATIVE DIAGNOSIS:  infected right foot  POST-OPERATIVE DIAGNOSIS:  Same  PROCEDURE:  IRRIGATION AND DEBRIDEMENT EXTREMITY excision skin and soft tissue muscle and bone with decompression of the abscess excision of the sesamoid bones, application of installation wound VAC  SURGEON:  [name], MD  PHYSICIAN ASSISTANT:None ANESTHESIA:   General  PREOPERATIVE INDICATIONS:  Keith [name] is a  51 y.o. male with a diagnosis of infected right foot who failed conservative measures and elected for surgical management.    The risks benefits and alternatives were discussed with the patient preoperatively including but not limited to the risks of infection, bleeding, nerve injury, cardiopulmonary complications, the need for revision surgery, among others, and the patient was willing to proceed.  OPERATIVE IMPLANTS: Reticulated wound VAC foam.  OPERATIVE FINDINGS: Large abscess with an area of 5 x 11 cm.  Tissue was sent for cultures. E Is 11 x 5 cm debridement. Wound VAC settings of the 100 mm of suction, 10 minutes dwell time, 10 cc of irrigation.  OPERATIVE PROCEDURE: Patient was brought to the operating room after undergoing an ankle block with a MAC anesthetic.  After adequate levels of anesthesia were obtained patient's right lower extremity was prepped using DuraPrep draped into a sterile field a timeout was called.  A oval-shaped elliptical incision was made around the ulcerative tissue beneath the first metatarsal head.  The ulcerative necrotic tissue extended down and surrounding the sesamoids.  The sesamoids the tendons fascia muscle were excised and a block of tissue.  This was sent to pathology for cultures.  Electrocautery was used for hemostasis.  The wound was irrigated with normal saline.  Post-debridement wound measurements were 11 x 5 cm.  Patient also had a superficial ulcer over the fifth metatarsal head this was  debrided of skin and soft tissue this ulcer is 2 cm in diameter 5 mm deep the plantar ulcer is 1 cm deep.  The foot was then prepped using benzalkonium the skin was draped with windowpane draping the reticulated wound VAC foam followed by these solid foam and a bridge was placed between the 2 wounds.  This was covered with [place] this had a good suction fit patient was taken to the PACU in stable condition.   DISCHARGE PLANNING:  Antibiotic duration: Patient will need at least 4 weeks of IV antibiotics  Weightbearing: Strict nonweightbearing right foot  Pain medication: As needed  Dressing care/ Wound VAC: Wound VAC with irrigation to be left in place until Friday with return to the operating room on Friday for repeat debridement.    Discharge to: Discharge planning based on patient's availability of support and ambulatory status he may require discharge to skilled nursing.  Follow-up: In the office 1 week post operative.

## 2017-03-19 ENCOUNTER — Encounter (HOSPITAL_COMMUNITY): Payer: Self-pay | Admitting: Orthopedic Surgery

## 2017-03-19 DIAGNOSIS — A4101 Sepsis due to Methicillin susceptible Staphylococcus aureus: Secondary | ICD-10-CM

## 2017-03-19 DIAGNOSIS — E1122 Type 2 diabetes mellitus with diabetic chronic kidney disease: Secondary | ICD-10-CM

## 2017-03-19 DIAGNOSIS — R739 Hyperglycemia, unspecified: Secondary | ICD-10-CM

## 2017-03-19 DIAGNOSIS — I129 Hypertensive chronic kidney disease with stage 1 through stage 4 chronic kidney disease, or unspecified chronic kidney disease: Secondary | ICD-10-CM

## 2017-03-19 LAB — CBC
HCT: 26.8 % — ABNORMAL LOW (ref 39.0–52.0)
Hemoglobin: 8.8 g/dL — ABNORMAL LOW (ref 13.0–17.0)
MCH: 27.7 pg (ref 26.0–34.0)
MCHC: 32.8 g/dL (ref 30.0–36.0)
MCV: 84.3 fL (ref 78.0–100.0)
Platelets: 313 10*3/uL (ref 150–400)
RBC: 3.18 MIL/uL — ABNORMAL LOW (ref 4.22–5.81)
RDW: 13 % (ref 11.5–15.5)
WBC: 12.5 10*3/uL — ABNORMAL HIGH (ref 4.0–10.5)

## 2017-03-19 LAB — GLUCOSE, CAPILLARY
Glucose-Capillary: 131 mg/dL — ABNORMAL HIGH (ref 65–99)
Glucose-Capillary: 160 mg/dL — ABNORMAL HIGH (ref 65–99)
Glucose-Capillary: 163 mg/dL — ABNORMAL HIGH (ref 65–99)
Glucose-Capillary: 177 mg/dL — ABNORMAL HIGH (ref 65–99)

## 2017-03-19 LAB — BASIC METABOLIC PANEL
Anion gap: 11 (ref 5–15)
BUN: 8 mg/dL (ref 6–20)
CO2: 25 mmol/L (ref 22–32)
Calcium: 8.3 mg/dL — ABNORMAL LOW (ref 8.9–10.3)
Chloride: 98 mmol/L — ABNORMAL LOW (ref 101–111)
Creatinine, Ser: 1.51 mg/dL — ABNORMAL HIGH (ref 0.61–1.24)
GFR calc Af Amer: 60 mL/min — ABNORMAL LOW (ref >60)
GFR calc non Af Amer: 51 mL/min — ABNORMAL LOW (ref >60)
Glucose, Bld: 133 mg/dL — ABNORMAL HIGH (ref 65–99)
Potassium: 3.2 mmol/L — ABNORMAL LOW (ref 3.5–5.1)
Sodium: 134 mmol/L — ABNORMAL LOW (ref 135–145)

## 2017-03-19 MED ORDER — POTASSIUM CHLORIDE CRYS ER 20 MEQ PO TBCR
40.0000 meq | EXTENDED_RELEASE_TABLET | Freq: Once | ORAL | Status: AC
  Administered 2017-03-19: 40 meq via ORAL
  Filled 2017-03-19: qty 2

## 2017-03-19 MED ORDER — SODIUM CHLORIDE 0.9 % IV SOLN
INTRAVENOUS | Status: DC
  Administered 2017-03-20: 23:00:00 via INTRAVENOUS

## 2017-03-19 MED ORDER — SODIUM CHLORIDE 0.9 % IV SOLN
3.0000 g | Freq: Four times a day (QID) | INTRAVENOUS | Status: DC
  Filled 2017-03-19 (×2): qty 3

## 2017-03-19 MED ORDER — SODIUM CHLORIDE 0.9 % IV SOLN
3.0000 g | Freq: Four times a day (QID) | INTRAVENOUS | Status: DC
  Administered 2017-03-19 – 2017-03-24 (×20): 3 g via INTRAVENOUS
  Filled 2017-03-19 (×23): qty 3

## 2017-03-19 NOTE — Evaluation (Signed)
Physical Therapy Evaluation Patient Details Name: Keith [name] MRN: [id number] DOB: [birth year] Today's Date: 03/19/2017   History of Present Illness  51 y.o. male with history of HIV, diabetes mellitus type 2, hypertension presents to the ER with complaints of increasing pain and swelling in the right foot for the last few days. S/p incision and drainage by Dr. Louanne Skye 12/8 S/P IRRIGATION AND DEBRIDEMENT EXTREMITY excision skin and soft tissue muscle and bone with decompression of the abscess excision of the sesamoid bones, application of installation wound VAC 03/18/17.Planned foot salvage by [name] 03/21/17   Clinical Impression  Patient is s/p above surgery resulting in functional limitations due to the deficits listed below (see PT Problem List). Pt is limited in his mobility by decreased safety awareness in terms of maintaining NWB status through R LE as well as generalized weakness. Pt is currently mod I for bed mobility and minA for stand pivot transfer to recliner. Pt is not sure of the level of support his family will be able to provide. Recommend SNF level discharge unless they are able to provide 24 hour care.  Patient will benefit from skilled PT to increase their independence and safety with mobility to allow discharge to the venue listed below.       Follow Up Recommendations SNF    Equipment Recommendations  Rolling walker with 5" wheels;3in1 (PT)    Recommendations for Other Services       Precautions / Restrictions Precautions Precautions: Other (comment) Precaution Comments: Wound vac Restrictions Weight Bearing Restrictions: Yes RLE Weight Bearing: Non weight bearing      Mobility  Bed Mobility Overal bed mobility: Modified Independent                Transfers Overall transfer level: Needs assistance Equipment used: Rolling walker (2 wheeled) Transfers: Stand Pivot Transfers;Sit to/from Stand Sit to Stand: Min guard Stand pivot transfers: Min  assist       General transfer comment: min guard for safety with sit<>stand, minA and maximal cuing required to maintain NWB through RLE for pivot transfer to recliner  Ambulation/Gait             General Gait Details: unable at this time      Balance Overall balance assessment: Needs assistance Sitting-balance support: No upper extremity supported;Feet supported Sitting balance-Leahy Scale: Good     Standing balance support: Bilateral upper extremity supported Standing balance-Leahy Scale: Poor Standing balance comment: requires bilateral UE support to maintain NWB and balance                             Pertinent Vitals/Pain Pain Assessment: 0-10 Pain Score: 8  Pain Location: R foot Pain Descriptors / Indicators: Aching;Constant;Burning Pain Intervention(s): Limited activity within patient's tolerance;Monitored during session;Repositioned    Home Living Family/patient expects to be discharged to:: Private residence Living Arrangements: Other relatives Available Help at Discharge: Friend(s);Available 24 hours/day Type of Home: Apartment Home Access: Stairs to enter Entrance Stairs-Rails: Can reach both Entrance Stairs-Number of Steps: 1 Home Layout: One level Home Equipment: None      Prior Function Level of Independence: Independent                  Extremity/Trunk Assessment   Upper Extremity Assessment Upper Extremity Assessment: Overall WFL for tasks assessed    Lower Extremity Assessment Lower Extremity Assessment: RLE deficits/detail RLE Deficits / Details: grossly assesed hip strength in seated at 4/5,  unable to formally assess knee and ankle due to pain,  RLE: Unable to fully assess due to pain       Communication   Communication: No difficulties  Cognition Arousal/Alertness: Awake/alert Behavior During Therapy: WFL for tasks assessed/performed Overall Cognitive Status: Within Functional Limits for tasks assessed                                                Assessment/Plan    PT Assessment Patient needs continued PT services  PT Problem List Decreased strength;Decreased range of motion;Decreased activity tolerance;Decreased balance;Decreased mobility;Decreased knowledge of use of DME;Decreased safety awareness;Pain       PT Treatment Interventions DME instruction;Gait training;Stair training;Functional mobility training;Therapeutic activities;Therapeutic exercise;Balance training;Cognitive remediation;Patient/family education    PT Goals (Current goals can be found in the Care Plan section)  Acute Rehab PT Goals Patient Stated Goal: get better PT Goal Formulation: With patient Time For Goal Achievement: 04/02/17 Potential to Achieve Goals: Fair    Frequency Min 3X/week   Barriers to discharge Decreased caregiver support         AM-PAC PT "6 Clicks" Daily Activity  Outcome Measure Difficulty turning over in bed (including adjusting bedclothes, sheets and blankets)?: A Little Difficulty moving from lying on back to sitting on the side of the bed? : A Little Difficulty sitting down on and standing up from a chair with arms (e.g., wheelchair, bedside commode, etc,.)?: Unable Help needed moving to and from a bed to chair (including a wheelchair)?: A Little Help needed walking in hospital room?: A Lot Help needed climbing 3-5 steps with a railing? : Total 6 Click Score: 13    End of Session Equipment Utilized During Treatment: Gait belt Activity Tolerance: Patient tolerated treatment well Patient left: in chair;with call bell/phone within reach;with chair alarm set;with nursing/sitter in room Nurse Communication: Mobility status PT Visit Diagnosis: Unsteadiness on feet (R26.81);Other abnormalities of gait and mobility (R26.89);Muscle weakness (generalized) (M62.81);Difficulty in walking, not elsewhere classified (R26.2);Pain Pain - Right/Left: Right Pain - part of body:  Ankle and joints of foot    Time: 1242-1311 PT Time Calculation (min) (ACUTE ONLY): 29 min   Charges:   PT Evaluation $PT Eval Moderate Complexity: 1 Mod PT Treatments $Therapeutic Activity: 8-22 mins   PT G Codes:        Nika Yazzie B. Migdalia Dk PT, DPT Acute Rehabilitation  [phone] Pager [phone]    [place] 03/19/2017, 2:54 PM

## 2017-03-19 NOTE — Progress Notes (Signed)
PHARMACY CONSULT NOTE FOR:  OUTPATIENT  PARENTERAL ANTIBIOTIC THERAPY (OPAT)  Indication: diabetic foot infection Regimen: Unasyn 3 g IV q6h End date: 04/17/17  IV antibiotic discharge orders are pended. To discharging provider:  please sign these orders via discharge navigator,  Select New Orders & click on the button choice - Manage This Unsigned Work.     Thank you for allowing pharmacy to be a part of this patient's care.  Renold Genta, PharmD, BCPS Clinical Pharmacist Phone for today - [place] - [phone] 03/19/2017 2:47 PM

## 2017-03-19 NOTE — H&P (View-Only) (Signed)
[name], male   DOB: 05-01-1965, 51 y.o.   MRN: [id number] Postoperative day 1 irrigation and debridement massive area of the right foot with necrotic skin and soft tissues muscle with osteomyelitis of the sesamoids.  The installation wound VAC is functioning well plan to return to the operating room on Friday for reevaluation of foot salvage.

## 2017-03-19 NOTE — Care Management Important Message (Signed)
Important Message  Patient Details  Name: WILIAM CAUTHORN MRN: [id number] Date of Birth: 05/12/1965   Medicare Important Message Given:  Yes    Dwayne Bulkley 03/19/2017, 2:04 PM

## 2017-03-19 NOTE — Progress Notes (Signed)
Inpatient Diabetes Program Recommendations  AACE/ADA: New Consensus Statement on Inpatient Glycemic Control (2015)  Target Ranges:  Prepandial:   less than 140 mg/dL      Peak postprandial:   less than 180 mg/dL (1-2 hours)      Critically ill patients:  140 - 180 mg/dL   Lab Results  Component Value Date   GLUCAP 160 (H) 03/19/2017   HGBA1C 10.1 (H) 03/15/2017    Review of Glycemic Control  Spoke with patient about diabetes and home regimen for diabetes control. Patient reports that he is taking insulin as prescribed. Inquired about prior A1C and patient reports that they do not recall his last A1C value. Discussed A1C results (10.1% on 03/15/17). Discussed glucose and A1C goals. Discussed importance of checking CBGs and maintaining good CBG control to prevent long-term and short-term complications. Explained how hyperglycemia leads to damage within blood vessels which lead to the common complications seen with uncontrolled diabetes. Stressed to the patient the importance of improving glycemic control to prevent further complications from uncontrolled diabetes. Discussed impact of nutrition, exercise, stress, sickness, and medications on diabetes control.Spoke with patient about his current insulin regimen and glucose trend.  Patient verbalized understanding of information discussed and he states that he has no further questions at this time related to diabetes.   Thanks,  Tama Headings RN, MSN, [facility] Inpatient Diabetes Coordinator Team Pager [phone] (8a-5p)

## 2017-03-19 NOTE — Progress Notes (Signed)
PROGRESS NOTE  Keith [name] DHR:[id number] DOB: 1966/03/02 DOA: 03/15/2017 PCP: Scot Jun, FNP  HPI/Recap of past 24 hours: Keith [name] a 51 y.o.malewithhistory of HIV, diabetes mellitus type 2, hypertension presents to the ER with complaints of increasing pain and swelling in the right foot for the last few days. Patient also noticed increasing discharge from the foot ulcer.Patient states he has been compliant with his anti-retroviral's and Lantus dose.On exam, he has a first metatarsal ulcer on plantar surface as well as skin laceration on lateral edge of foot. MRI right foot completed showedfirst MTP joint osteomyelitisas well as abscess. Orthopedic surgery was consulted as well as ID.  Today, pt reported no new complains except R foot pain. Pt denies any chest pain, SOB, abdominal pain, N/V/D/C, fever/chills.   Assessment/Plan: Principal Problem:   Sepsis ([place]) Active Problems:   Essential hypertension   Insulin-requiring or dependent type II diabetes mellitus (HCC)   HIV disease (HCC)   CKD (chronic kidney disease) stage 3, GFR 30-59 ml/min (HCC)   Abscess of right foot  #Sepsis secondary to osteomyelitis of right first MTP, DM wound  Resolving -Currently afebrile, with resolving leukocytosis -Ortho and ID consulted -ABI normal  -S/p incision and drainage by Dr. Louanne Skye 12/8 -S/p irrigation and debridement, excision with decompression of abscess of sesamoid bone, wound    vac placement by [name] 12/11 -Wound cx growing GBS, MSSA  -BC NGTD -Continue IV zosyn, d/c dapto -[name] planning for return to OR on 03/21/17 for repeat debridement   #AKI  -Ongoing  -Baseline Cr 1 -Renal USS done on 09/2016: Normal size kidneys without hydronephrosis. Mild increased cortical   echogenicity which can be seen with medical renal disease.  -Continue IVF -Strict I/Os, bladder scan -Daily BMP  #DM type 2, uncontrolled with hyperglycemia -Ha1c 10.1 -DM  coordinator consult  -Lantus, Novolog, SSI  #HIV -Continue home meds   #HTN -Continue norvasc   #Hypokalemia -Replace prn     Code Status: Full  Family Communication: None at bedside  Disposition Plan: Home once stable   Consultants:  Ortho  ID  Procedures:  S/p incision and drainage by Dr. Louanne Skye 12/8   S/p irrigation and debridement, excision with decompression of abscess of sesamoid bone, wound vac placement by [name] 12/11   Antimicrobials:    DVT prophylaxis:  IV Zosyn S/P IV Daptomycin   Objective: Vitals:   03/19/17 0500 03/19/17 0517 03/19/17 0656 03/19/17 0844  BP:  129/81  (!) 107/57  Pulse:  81    Resp:  18    Temp:  99.1 F (37.3 C)    TempSrc:  Oral    SpO2:  100%    Weight: 115.8 kg (255 lb 4.7 oz)  115.8 kg (255 lb 4.7 oz)   Height:        Intake/Output Summary (Last 24 hours) at 03/19/2017 1245 Last data filed at 03/19/2017 0656 Gross per 24 hour  Intake 3215.5 ml  Output 1025 ml  Net 2190.5 ml   Filed Weights   03/18/17 0500 03/19/17 0500 03/19/17 0656  Weight: 115.1 kg (253 lb 12 oz) 115.8 kg (255 lb 4.7 oz) 115.8 kg (255 lb 4.7 oz)    Exam:   General: Awake, alert, oriented x3  Cardiovascular: S1-S2 present, no added heart sounds  Respiratory: Chest clear to auscultation bilaterally  Abdomen: Soft, nontender, nondistended, bowel sounds present  Musculoskeletal: No bilateral pedal edema  Skin: Right foot dressing intact, wound VAC placed  Psychiatry: Normal   Data Reviewed: CBC: Recent Labs  Lab 03/15/17 0348 03/16/17 0230 03/17/17 0213 03/18/17 0223 03/19/17 0522  WBC 21.2* 16.5* 13.8* 12.6* 12.5*  NEUTROABS 18.2*  --   --   --   --   HGB 9.9* 8.8* 9.6* 9.6* 8.8*  HCT 29.7* 26.8* 29.2* 29.0* 26.8*  MCV 84.9 83.5 83.7 83.8 84.3  PLT 252 244 294 331 673   Basic Metabolic Panel: Recent Labs  Lab 03/15/17 1008 03/16/17 0230 03/17/17 0213 03/18/17 0223 03/19/17 0522  NA 129* 136 134* 136  134*  K 3.6 3.5 3.5 3.2* 3.2*  CL 97* 101 99* 98* 98*  CO2 23 25 23 28 25   GLUCOSE 360* 170* 190* 104* 133*  BUN 23* 17 11 6 8   CREATININE 1.91* 1.95* 1.71* 1.44* 1.51*  CALCIUM 8.2* 8.5* 8.6* 8.6* 8.3*   GFR: Estimated Creatinine Clearance: 71.8 mL/min (A) (by C-G formula based on SCr of 1.51 mg/dL (H)). Liver Function Tests: No results for input(s): AST, ALT, ALKPHOS, BILITOT, PROT, ALBUMIN in the last 168 hours. No results for input(s): LIPASE, AMYLASE in the last 168 hours. No results for input(s): AMMONIA in the last 168 hours. Coagulation Profile: No results for input(s): INR, PROTIME in the last 168 hours. Cardiac Enzymes: Recent Labs  Lab 03/16/17 1758  CKTOTAL 113   BNP (last 3 results) No results for input(s): PROBNP in the last 8760 hours. HbA1C: No results for input(s): HGBA1C in the last 72 hours. CBG: Recent Labs  Lab 03/18/17 1237 03/18/17 1717 03/18/17 2107 03/19/17 0816 03/19/17 1212  GLUCAP 175* 137* 136* 131* 160*   Lipid Profile: No results for input(s): CHOL, HDL, LDLCALC, TRIG, CHOLHDL, LDLDIRECT in the last 72 hours. Thyroid Function Tests: No results for input(s): TSH, T4TOTAL, FREET4, T3FREE, THYROIDAB in the last 72 hours. Anemia Panel: No results for input(s): VITAMINB12, FOLATE, FERRITIN, TIBC, IRON, RETICCTPCT in the last 72 hours. Urine analysis:    Component Value Date/Time   COLORURINE YELLOW 11/18/2016 1429   APPEARANCEUR CLOUDY (A) 11/18/2016 1429   LABSPEC >=1.030 01/01/2017 1032   PHURINE 5.5 01/01/2017 1032   GLUCOSEU 250 (A) 01/01/2017 1032   HGBUR SMALL (A) 01/01/2017 1032   BILIRUBINUR NEGATIVE 01/01/2017 1032   KETONESUR NEGATIVE 01/01/2017 1032   PROTEINUR 100 (A) 01/01/2017 1032   UROBILINOGEN 1.0 01/01/2017 1032   NITRITE NEGATIVE 01/01/2017 1032   LEUKOCYTESUR NEGATIVE 01/01/2017 1032   Sepsis Labs: @LABRCNTIP (procalcitonin:4,lacticidven:4)  ) Recent Results (from the past 240 hour(s))  Wound or Superficial  Culture     Status: None   Collection Time: 03/15/17  3:28 AM  Result Value Ref Range Status   Specimen Description WOUND RIGHT FOOT  Final   Special Requests NONE  Final   Gram Stain   Final    ABUNDANT WBC PRESENT, PREDOMINANTLY PMN ABUNDANT GRAM NEGATIVE RODS ABUNDANT GRAM POSITIVE COCCI    Culture   Final    RARE STAPHYLOCOCCUS AUREUS RARE GROUP B STREP(S.AGALACTIAE)ISOLATED TESTING AGAINST S. AGALACTIAE NOT ROUTINELY PERFORMED DUE TO PREDICTABILITY OF AMP/PEN/VAN SUSCEPTIBILITY. WITHIN MIXED CULTURE    Report Status 03/18/2017 FINAL  Final   Organism ID, Bacteria STAPHYLOCOCCUS AUREUS  Final      Susceptibility   Staphylococcus aureus - MIC*    CIPROFLOXACIN <=0.5 SENSITIVE Sensitive     ERYTHROMYCIN >=8 RESISTANT Resistant     GENTAMICIN <=0.5 SENSITIVE Sensitive     OXACILLIN <=0.25 SENSITIVE Sensitive     TETRACYCLINE <=1 SENSITIVE Sensitive  VANCOMYCIN <=0.5 SENSITIVE Sensitive     TRIMETH/SULFA <=10 SENSITIVE Sensitive     CLINDAMYCIN RESISTANT Resistant     RIFAMPIN <=0.5 SENSITIVE Sensitive     Inducible Clindamycin POSITIVE Resistant     * RARE STAPHYLOCOCCUS AUREUS  Blood culture (routine x 2)     Status: None (Preliminary result)   Collection Time: 03/15/17  3:55 AM  Result Value Ref Range Status   Specimen Description BLOOD RIGHT ANTECUBITAL  Final   Special Requests   Final    BOTTLES DRAWN AEROBIC AND ANAEROBIC Blood Culture adequate volume   Culture NO GROWTH 3 DAYS  Final   Report Status PENDING  Incomplete  Blood culture (routine x 2)     Status: None (Preliminary result)   Collection Time: 03/15/17  4:00 AM  Result Value Ref Range Status   Specimen Description BLOOD LEFT ANTECUBITAL  Final   Special Requests   Final    BOTTLES DRAWN AEROBIC AND ANAEROBIC Blood Culture adequate volume   Culture NO GROWTH 3 DAYS  Final   Report Status PENDING  Incomplete  MRSA PCR Screening     Status: None   Collection Time: 03/15/17  2:45 PM  Result Value Ref  Range Status   MRSA by PCR NEGATIVE NEGATIVE Final    Comment:        The GeneXpert MRSA Assay (FDA approved for NASAL specimens only), is one component of a comprehensive MRSA colonization surveillance program. It is not intended to diagnose MRSA infection nor to guide or monitor treatment for MRSA infections.   Surgical pcr screen     Status: None   Collection Time: 03/18/17  1:21 AM  Result Value Ref Range Status   MRSA, PCR NEGATIVE NEGATIVE Final   Staphylococcus aureus NEGATIVE NEGATIVE Final    Comment: (NOTE) The Xpert SA Assay (FDA approved for NASAL specimens in patients 5 years of age and older), is one component of a comprehensive surveillance program. It is not intended to diagnose infection nor to guide or monitor treatment.   Aerobic/Anaerobic Culture (surgical/deep wound)     Status: None (Preliminary result)   Collection Time: 03/18/17 10:23 AM  Result Value Ref Range Status   Specimen Description TISSUE RIGHT FOOT  Final   Special Requests NONE  Final   Gram Stain   Final    FEW WBC PRESENT, PREDOMINANTLY PMN NO ORGANISMS SEEN    Culture   Final    RARE GROUP B STREP(S.AGALACTIAE)ISOLATED TESTING AGAINST S. AGALACTIAE NOT ROUTINELY PERFORMED DUE TO PREDICTABILITY OF AMP/PEN/VAN SUSCEPTIBILITY. CRITICAL RESULT CALLED TO, READ BACK BY AND VERIFIED WITH: E. MCAULEY RN, AT [id number] 03/19/17 BY D. VANHOOK REGARDING CULTURE GROWTH    Report Status PENDING  Incomplete      Studies: No results found.  Scheduled Meds: . abacavir-dolutegravir-lamiVUDine  1 tablet Oral Daily  . amLODipine  10 mg Oral Daily  . darunavir-cobicistat  1 tablet Oral Daily  . feeding supplement (GLUCERNA SHAKE)  237 mL Oral TID BM  . feeding supplement (PRO-STAT SUGAR FREE 64)  30 mL Oral BID  . heparin injection (subcutaneous)  5,000 Units Subcutaneous Q8H  . insulin aspart  0-15 Units Subcutaneous TID WC  . insulin aspart  0-5 Units Subcutaneous QHS  . insulin aspart  5 Units  Subcutaneous TID WC  . insulin glargine  30 Units Subcutaneous Daily    Continuous Infusions: . sodium chloride 125 mL/hr at 03/19/17 0521  . sodium chloride    . lactated ringers  10 mL/hr at 03/18/17 0926  . piperacillin-tazobactam (ZOSYN)  IV 3.375 g (03/19/17 0521)     LOS: 4 days     Alma Friendly, MD [facility]   If 7PM-7AM, please contact night-coverage [email or web address] Password [email or web address] 03/19/2017, 12:45 PM

## 2017-03-19 NOTE — Progress Notes (Signed)
Pharmacy Antibiotic Note  Keith [name] is a 51 y.o. male with well-controlled HIV who was admitted on 03/15/2017 with a diabetic foot wound.  Pharmacy has been consulted for Zosyn dosing. WBC elevated but downward trending. Patient is afebrile  MRI shows osteo involvement and plantar abscess. He had I&D on 12/8 and 12/11, plan for repeat I&D on Friday. Cultures are pending. Pt has a history of AKI on Vancomycin. Plans are to treat the OM for a total of 6 weeks. Wound growing MSSA + GBS.  Plan: Continue Zosyn 3.375 g IV q8h to be infused over 4 hours Monitor clinical progress, renal function and cultures  Height: 5\' 9"  (175.3 cm) Weight: 255 lb 4.7 oz (115.8 kg) IBW/kg (Calculated) : 70.7  Temp (24hrs), Avg:97.9 F (36.6 C), Min:97 F (36.1 C), Max:99.1 F (37.3 C)  Recent Labs  Lab 03/15/17 0348 03/15/17 0400 03/15/17 1008 03/16/17 0230 03/17/17 0213 03/18/17 0223 03/19/17 0522  WBC 21.2*  --   --  16.5* 13.8* 12.6* 12.5*  CREATININE 2.11*  --  1.91* 1.95* 1.71* 1.44* 1.51*  LATICACIDVEN  --  1.23  --   --   --   --   --     Estimated Creatinine Clearance: 71.8 mL/min (A) (by C-G formula based on SCr of 1.51 mg/dL (H)).    Allergies  Allergen Reactions  . Sulfa Antibiotics Itching   Zosyn 12/8 >>  Cubicin 12/8>> 12/11 12/9 CK 113 Vancomycin x 1 12/8  Linezolid x 1 12/8  12/8 MRSA PCR negative 12/8 BCx: ngtd 12/8 right foot WCx: MSSA (S: PCN, TCN, Septra, Vanc, R:Clinda) + GBS 12/11 MRSA PCR negative 12/11 right foot tissue: ngtd   Renold Genta, PharmD, BCPS Clinical Pharmacist Phone for today - [place] - [phone] 03/19/2017 9:20 AM

## 2017-03-19 NOTE — Progress Notes (Signed)
    [facility] for Infectious Disease    Date of Admission:  03/15/2017   Total days of antibiotics 6        12/7>>>  Zosyn        12/8-12/10 Daptomycin   ID: Keith [name] is a 51 y.o. male with well controlled HIV but poorly controlled T2DM and neuropathy with right foot soft tissue infection and osteomyelitis s/p soft tissue debridement on 12/11.  Principal Problem:   Sepsis ([place]) Active Problems:   Essential hypertension   Insulin-requiring or dependent type II diabetes mellitus (HCC)   HIV disease (HCC)   CKD (chronic kidney disease) stage 3, GFR 30-59 ml/min (HCC)   Abscess of right foot    Subjective: He is drowsy lying in bed but arousable to voice. He reports right foot but otherwise feeling okay. No fevers or chills.  Medications:  . abacavir-dolutegravir-lamiVUDine  1 tablet Oral Daily  . amLODipine  10 mg Oral Daily  . darunavir-cobicistat  1 tablet Oral Daily  . feeding supplement (GLUCERNA SHAKE)  237 mL Oral TID BM  . feeding supplement (PRO-STAT SUGAR FREE 64)  30 mL Oral BID  . heparin injection (subcutaneous)  5,000 Units Subcutaneous Q8H  . insulin aspart  0-15 Units Subcutaneous TID WC  . insulin aspart  0-5 Units Subcutaneous QHS  . insulin aspart  5 Units Subcutaneous TID WC  . insulin glargine  30 Units Subcutaneous Daily    Objective: Vital signs in last 24 hours: Temp:  [98.2 F (36.8 C)-99.1 F (37.3 C)] 99.1 F (37.3 C) (12/12 0517) Pulse Rate:  [81-94] 81 (12/12 0517) Resp:  [16-18] 18 (12/12 0517) BP: (107-135)/(57-82) 107/57 (12/12 0844) SpO2:  [98 %-100 %] 100 % (12/12 0517) Weight:  [255 lb 4.7 oz (115.8 kg)] 255 lb 4.7 oz (115.8 kg) (12/12 0656) GENERAL- Drowsy but cooperative HEENT- PERRL, oral mucosa appears moist CARDIAC- RRR, no murmurs, rubs or gallops. RESP- CTAB, no wheezes or crackles. EXTREMITIES- R foot dressed with wound vac draining bloody fluid   Lab Results Recent Labs    03/18/17 0223 03/19/17 0522  WBC  12.6* 12.5*  HGB 9.6* 8.8*  HCT 29.0* 26.8*  NA 136 134*  K 3.2* 3.2*  CL 98* 98*  CO2 28 25  BUN 6 8  CREATININE 1.44* 1.51*   Liver Panel No results for input(s): PROT, ALBUMIN, AST, ALT, ALKPHOS, BILITOT, BILIDIR, IBILI in the last 72 hours. Sedimentation Rate No results for input(s): ESRSEDRATE in the last 72 hours. C-Reactive Protein No results for input(s): CRP in the last 72 hours.  Microbiology:  Studies/Results: No results found.   Assessment/Plan: 12/8 Wound Cx - GBS and MSSA 12/11 Cx NGTD He is probably covered appropriately with Zosyn at this time Temps, WBC improved on treatment Appreciate ortho management with anticipated debridement again Friday We will continue to follow repeat cultures Agree at least 4 weeks antibiotics needed with preservation of infection foot  Collier Salina, MD [facility] Internal Medicine Resident Pager# [phone] 03/19/2017, 11:58 AM

## 2017-03-19 NOTE — Progress Notes (Signed)
[name], male   DOB: 1966-04-03, 51 y.o.   MRN: [id number] Postoperative day 1 irrigation and debridement massive area of the right foot with necrotic skin and soft tissues muscle with osteomyelitis of the sesamoids.  The installation wound VAC is functioning well plan to return to the operating room on Friday for reevaluation of foot salvage.

## 2017-03-20 ENCOUNTER — Other Ambulatory Visit (INDEPENDENT_AMBULATORY_CARE_PROVIDER_SITE_OTHER): Payer: Self-pay | Admitting: Family

## 2017-03-20 LAB — CBC WITH DIFFERENTIAL/PLATELET
Basophils Absolute: 0 10*3/uL (ref 0.0–0.1)
Basophils Relative: 0 %
Eosinophils Absolute: 0.3 10*3/uL (ref 0.0–0.7)
Eosinophils Relative: 2 %
HCT: 26.2 % — ABNORMAL LOW (ref 39.0–52.0)
Hemoglobin: 8.4 g/dL — ABNORMAL LOW (ref 13.0–17.0)
Lymphocytes Relative: 20 %
Lymphs Abs: 2.2 10*3/uL (ref 0.7–4.0)
MCH: 27.3 pg (ref 26.0–34.0)
MCHC: 32.1 g/dL (ref 30.0–36.0)
MCV: 85.1 fL (ref 78.0–100.0)
Monocytes Absolute: 0.8 10*3/uL (ref 0.1–1.0)
Monocytes Relative: 7 %
Neutro Abs: 7.8 10*3/uL — ABNORMAL HIGH (ref 1.7–7.7)
Neutrophils Relative %: 71 %
Platelets: 329 10*3/uL (ref 150–400)
RBC: 3.08 MIL/uL — ABNORMAL LOW (ref 4.22–5.81)
RDW: 13 % (ref 11.5–15.5)
WBC: 11 10*3/uL — ABNORMAL HIGH (ref 4.0–10.5)

## 2017-03-20 LAB — BASIC METABOLIC PANEL
Anion gap: 8 (ref 5–15)
BUN: 6 mg/dL (ref 6–20)
CO2: 27 mmol/L (ref 22–32)
Calcium: 8.2 mg/dL — ABNORMAL LOW (ref 8.9–10.3)
Chloride: 99 mmol/L — ABNORMAL LOW (ref 101–111)
Creatinine, Ser: 1.46 mg/dL — ABNORMAL HIGH (ref 0.61–1.24)
GFR calc Af Amer: >60 mL/min (ref >60)
GFR calc non Af Amer: 54 mL/min — ABNORMAL LOW (ref >60)
Glucose, Bld: 194 mg/dL — ABNORMAL HIGH (ref 65–99)
Potassium: 3.3 mmol/L — ABNORMAL LOW (ref 3.5–5.1)
Sodium: 134 mmol/L — ABNORMAL LOW (ref 135–145)

## 2017-03-20 LAB — CULTURE, BLOOD (ROUTINE X 2)
Culture: NO GROWTH
Culture: NO GROWTH
Special Requests: ADEQUATE
Special Requests: ADEQUATE

## 2017-03-20 LAB — GLUCOSE, CAPILLARY
Glucose-Capillary: 177 mg/dL — ABNORMAL HIGH (ref 65–99)
Glucose-Capillary: 188 mg/dL — ABNORMAL HIGH (ref 65–99)
Glucose-Capillary: 94 mg/dL (ref 65–99)
Glucose-Capillary: 129 mg/dL — ABNORMAL HIGH (ref 65–99)

## 2017-03-20 MED ORDER — DOCUSATE SODIUM 100 MG PO CAPS
100.0000 mg | ORAL_CAPSULE | Freq: Two times a day (BID) | ORAL | Status: DC
  Administered 2017-03-21 – 2017-03-23 (×6): 100 mg via ORAL
  Filled 2017-03-20 (×8): qty 1

## 2017-03-20 MED ORDER — METHOCARBAMOL 1000 MG/10ML IJ SOLN: 500.0000 mg | Freq: Four times a day (QID) | INTRAVENOUS | Status: DC | PRN

## 2017-03-20 MED ORDER — ACETAMINOPHEN 650 MG RE SUPP: 650.0000 mg | RECTAL | Status: DC | PRN

## 2017-03-20 MED ORDER — METOCLOPRAMIDE HCL 5 MG/ML IJ SOLN: 5.0000 mg | Freq: Three times a day (TID) | INTRAMUSCULAR | Status: DC | PRN

## 2017-03-20 MED ORDER — HYDROMORPHONE HCL 1 MG/ML IJ SOLN
1.0000 mg | INTRAMUSCULAR | Status: DC | PRN
  Administered 2017-03-21 – 2017-03-24 (×11): 1 mg via INTRAVENOUS
  Filled 2017-03-20 (×11): qty 1

## 2017-03-20 MED ORDER — METOCLOPRAMIDE HCL 10 MG PO TABS: 5.0000 mg | ORAL_TABLET | Freq: Three times a day (TID) | ORAL | Status: DC | PRN

## 2017-03-20 MED ORDER — METHOCARBAMOL 500 MG PO TABS: 500.0000 mg | ORAL_TABLET | Freq: Four times a day (QID) | ORAL | Status: DC | PRN

## 2017-03-20 MED ORDER — MAGNESIUM CITRATE PO SOLN: 1.0000 | Freq: Once | ORAL | Status: DC | PRN

## 2017-03-20 MED ORDER — ACETAMINOPHEN 325 MG PO TABS: 650.0000 mg | ORAL_TABLET | ORAL | Status: DC | PRN

## 2017-03-20 MED ORDER — BISACODYL 10 MG RE SUPP: 10.0000 mg | Freq: Every day | RECTAL | Status: DC | PRN

## 2017-03-20 MED ORDER — ONDANSETRON HCL 4 MG PO TABS: 4.0000 mg | ORAL_TABLET | Freq: Four times a day (QID) | ORAL | Status: DC | PRN

## 2017-03-20 MED ORDER — POLYETHYLENE GLYCOL 3350 17 G PO PACK: 17.0000 g | PACK | Freq: Every day | ORAL | Status: DC | PRN

## 2017-03-20 MED ORDER — ONDANSETRON HCL 4 MG/2ML IJ SOLN: 4.0000 mg | Freq: Four times a day (QID) | INTRAMUSCULAR | Status: DC | PRN

## 2017-03-20 NOTE — NC FL2 (Signed)
[place] LEVEL OF CARE SCREENING TOOL     IDENTIFICATION  Patient Name: Keith [name] Birthdate: 04-01-1966 Sex: male Admission Date (Current Location): 03/15/2017  [facility] and [place] Number:  Herbalist and Address:  [facility] [place], [place] [address] [place],  [address]      Provider Number: [id number]  Attending Physician Name and Address:  Alma Friendly, MD  Relative Name and Phone Number:       Current Level of Care: SNF Recommended Level of Care: [place] Prior Approval Number:    Date Approved/Denied:   PASRR Number: [id number]  Discharge Plan: SNF    Current Diagnoses: Patient Active Problem List   Diagnosis Date Noted  . Abscess of right foot   . Sepsis ([place]) 03/15/2017  . CKD (chronic kidney disease) stage 3, GFR 30-59 ml/min (HCC) 03/15/2017  . Cellulitis of right foot   . Diabetic polyneuropathy associated with type 2 diabetes mellitus ([place]) 01/23/2017  . Right foot ulcer, limited to breakdown of skin ([place]) 01/07/2017  . Osteomyelitis ([place]) 01/07/2017  . Dehydration 09/29/2016  . AKI (acute kidney injury) ([place]) 09/28/2016  . Diarrhea with dehydration 09/28/2016  . Nausea vomiting and diarrhea 09/28/2016  . Cellulitis and abscess of hand 06/04/2016  . Cellulitis of left hand 06/04/2016  . Onychomycosis of multiple toenails with type 2 diabetes mellitus ([place]) 08/29/2015  . MRSA carrier 04/20/2014  . Testosterone deficiency 04/20/2014  . Genital warts 02/22/2014  . HIV disease ([place])   . Insulin-requiring or dependent type II diabetes mellitus ([place]) 02/04/2014  . Dental anomaly 11/20/2012  . Arthritis of right knee 02/23/2012  . Hyperlipidemia 11/10/2011  . Chronic pain 08/07/2011  . Meralgia paraesthetica 04/23/2011  . ERECTILE DYSFUNCTION 08/22/2008  . Essential hypertension 05/19/2008    Orientation RESPIRATION BLADDER Height & Weight     Self, Time, Situation,  Place  Normal Continent Weight: 115.8 kg (255 lb 4.7 oz) Height:  5\' 9"  (175.3 cm)  BEHAVIORAL SYMPTOMS/MOOD NEUROLOGICAL BOWEL NUTRITION STATUS  (N/A)   Continent Diet(Please see DC Summary)  AMBULATORY STATUS COMMUNICATION OF NEEDS Skin   Extensive Assist Verbally Surgical wounds, Other (Comment), Wound Vac(Closed incision on foot with wound; negative pressure wound on foot)                       Personal Care Assistance Level of Assistance  Bathing, Feeding, Dressing Bathing Assistance: Maximum assistance Feeding assistance: Independent Dressing Assistance: Limited assistance     Functional Limitations Info             SPECIAL CARE FACTORS FREQUENCY  PT (By licensed PT)     PT Frequency: 5x/week              Contractures      Additional Factors Info  Code Status, Allergies, Insulin Sliding Scale Code Status Info: Full Allergies Info: Sulfa Antibiotics   Insulin Sliding Scale Info: 3x daily with meals and at bedtime       Current Medications (03/20/2017):  This is the current hospital active medication list Current Facility-Administered Medications  Medication Dose Route Frequency Provider Last Rate Last Dose  . 0.9 %  sodium chloride infusion   Intravenous Continuous Alma Friendly, MD 100 mL/hr at 03/20/17 0008    . 0.9 %  sodium chloride infusion   Intravenous Continuous [name], MD      . abacavir-dolutegravir-lamiVUDine ([place]) 600-50-300 MG per tablet 1  tablet  1 tablet Oral Daily Rise Patience, MD   1 tablet at 03/20/17 2010  . acetaminophen (TYLENOL) tablet 650 mg  650 mg Oral Q6H PRN Rise Patience, MD   650 mg at 03/16/17 2045   Or  . acetaminophen (TYLENOL) suppository 650 mg  650 mg Rectal Q6H PRN Rise Patience, MD      . amLODipine (NORVASC) tablet 10 mg  10 mg Oral Daily Rise Patience, MD   10 mg at 03/20/17 0912  . Ampicillin-Sulbactam (UNASYN) 3 g in sodium chloride 0.9 % 100 mL IVPB  3 g  Intravenous Q6H Alma Friendly, MD   Stopped at 03/20/17 [phone]  . cyclobenzaprine (FLEXERIL) tablet 10 mg  10 mg Oral BID PRN Rise Patience, MD      . darunavir-cobicistat (PREZCOBIX) 800-150 MG per tablet 1 tablet  1 tablet Oral Daily Rise Patience, MD   1 tablet at 03/20/17 [phone]  . feeding supplement (GLUCERNA SHAKE) (GLUCERNA SHAKE) liquid 237 mL  237 mL Oral TID BM Dessa Phi, DO   237 mL at 03/19/17 2241  . feeding supplement (PRO-STAT SUGAR FREE 64) liquid 30 mL  30 mL Oral BID Dessa Phi, DO   30 mL at 03/20/17 0913  . guaiFENesin-dextromethorphan (ROBITUSSIN DM) 100-10 MG/5ML syrup 5 mL  5 mL Oral Q4H PRN Dessa Phi, DO   5 mL at 03/17/17 1141  . heparin injection 5,000 Units  5,000 Units Subcutaneous Q8H Dessa Phi, DO   5,000 Units at 03/20/17 0555  . insulin aspart (novoLOG) injection 0-15 Units  0-15 Units Subcutaneous TID WC Dessa Phi, DO   3 Units at 03/20/17 0857  . insulin aspart (novoLOG) injection 0-5 Units  0-5 Units Subcutaneous QHS Dessa Phi, DO      . insulin aspart (novoLOG) injection 5 Units  5 Units Subcutaneous TID WC Dessa Phi, DO   5 Units at 03/19/17 1806  . insulin glargine (LANTUS) injection 30 Units  30 Units Subcutaneous Daily Dessa Phi, DO   30 Units at 03/20/17 8832  . lactated ringers infusion   Intravenous Continuous Lyn Hollingshead, MD 10 mL/hr at 03/18/17 [phone]    . LORazepam (ATIVAN) tablet 1 mg  1 mg Oral Q6H PRN Rise Patience, MD      . ondansetron [facility]) tablet 4 mg  4 mg Oral Q6H PRN Rise Patience, MD   4 mg at 03/17/17 1141   Or  . ondansetron (ZOFRAN) injection 4 mg  4 mg Intravenous Q6H PRN Rise Patience, MD      . oxyCODONE (Oxy IR/ROXICODONE) immediate release tablet 10 mg  10 mg Oral TID PRN Rise Patience, MD   10 mg at 03/20/17 2641     Discharge Medications: Please see discharge summary for a list of discharge medications.  Relevant Imaging  Results:  Relevant Lab Results:   Additional Information SSN: [place], [place]

## 2017-03-20 NOTE — Progress Notes (Signed)
While assessing  patient this evening noticed a pill bottle in patient bed.Questioned patient about medication and explained that medication needed to be locked up in pharmacy or sent home.Patient stated that he would have medication sent home in the morning.Educated patient not to take any medication that is not given to him by nursing staff.Charge nurse [place] notified.

## 2017-03-20 NOTE — Progress Notes (Signed)
PROGRESS NOTE  Keith [name] UDJ:[id number] DOB: 1966-03-17 DOA: 03/15/2017 PCP: Scot Jun, FNP  HPI/Recap of past 24 hours: Keith [name] a 51 y.o.malewithhistory of HIV, diabetes mellitus type 2, hypertension presents to the ER with complaints of increasing pain and swelling in the right foot for the last few days. Patient also noticed increasing discharge from the foot ulcer.Patient states he has been compliant with his anti-retroviral's and Lantus dose.On exam, he has a first metatarsal ulcer on plantar surface as well as skin laceration on lateral edge of foot. MRI right foot completed showedfirst MTP joint osteomyelitisas well as abscess. Orthopedic surgery was consulted as well as ID.  Today, pt reported no new complains except R foot pain. Pt denies any chest pain, SOB, abdominal pain, N/V/D/C, fever/chills.   Assessment/Plan: Principal Problem:   Sepsis ([place]) Active Problems:   Essential hypertension   Insulin-requiring or dependent type II diabetes mellitus ([place])   HIV disease (HCC)   CKD (chronic kidney disease) stage 3, GFR 30-59 ml/min (HCC)   Abscess of right foot  #Sepsis secondary to osteomyelitis of right first MTP, DM wound  Resolving -Currently afebrile, with resolving leukocytosis -Ortho and ID consulted -ABI normal  -S/p incision and drainage by Dr. Louanne Skye 12/8 -S/p irrigation and debridement, excision with decompression of abscess of sesamoid bone,    wound vac placement by [name] 12/11 -Wound cx growing GBS, MSSA  -BC NGTD -Started on IV Unasyn on 12/12, for a total of 4 weeks then onto PO for several months as per ID -s/p  IV zosyn, dapto -[name] planning for return to [place] on 03/21/17 for repeat debridement Vs amputation if needed   #AKI  -Ongoing  -Baseline Cr 1 -Renal USS done on 09/2016: Normal size kidneys without hydronephrosis. Mild increased   cortical echogenicity which can be seen with medical renal disease.    -Continue IVF -Strict I/Os, bladder scan -Daily BMP  #DM type 2, uncontrolled with hyperglycemia -Ha1c 10.1 -DM coordinator consult  -Lantus, Novolog, SSI  #HIV -Continue home meds   #HTN -Continue norvasc   #Hypokalemia -Replace prn     Code Status: Full  Family Communication: None at bedside  Disposition Plan: Home once stable   Consultants:  Ortho  ID  Procedures:  S/p incision and drainage by Dr. Louanne Skye 12/8   S/p irrigation and debridement, excision with decompression of abscess of sesamoid bone, wound vac placement by [name] 12/11   Antimicrobials:  IV Unasyn  S/P IV Zosyn, IV Daptomycin  DVT prophylaxis:  [place] Heparin   Objective: Vitals:   03/19/17 1316 03/19/17 2234 03/20/17 0656 03/20/17 0912  BP: 121/68 (!) 112/58 129/79 116/71  Pulse: 82 88 83   Resp: 20 19 18    Temp: 98.2 F (36.8 C) 99.9 F (37.7 C) 99.3 F (37.4 C)   TempSrc: Oral Oral Oral   SpO2: 100% 99% 100%   Weight:   115.8 kg (255 lb 4.7 oz)   Height:        Intake/Output Summary (Last 24 hours) at 03/20/2017 1224 Last data filed at 03/20/2017 0923 Gross per 24 hour  Intake 2020 ml  Output 2925 ml  Net -905 ml   Filed Weights   03/19/17 0500 03/19/17 0656 03/20/17 0656  Weight: 115.8 kg (255 lb 4.7 oz) 115.8 kg (255 lb 4.7 oz) 115.8 kg (255 lb 4.7 oz)    Exam:   General: Awake, alert, oriented x3  Cardiovascular: S1-S2 present, no added heart  sounds  Respiratory: Chest clear to auscultation bilaterally  Abdomen: Soft, nontender, nondistended, bowel sounds present  Musculoskeletal: No bilateral pedal edema  Skin: Right foot dressing intact, wound VAC placed  Psychiatry: Normal   Data Reviewed: CBC: Recent Labs  Lab 03/15/17 0348 03/16/17 0230 03/17/17 0213 03/18/17 0223 03/19/17 0522 03/20/17 0530  WBC 21.2* 16.5* 13.8* 12.6* 12.5* 11.0*  NEUTROABS 18.2*  --   --   --   --  7.8*  HGB 9.9* 8.8* 9.6* 9.6* 8.8* 8.4*  HCT 29.7* 26.8*  29.2* 29.0* 26.8* 26.2*  MCV 84.9 83.5 83.7 83.8 84.3 85.1  PLT 252 244 294 331 313 196   Basic Metabolic Panel: Recent Labs  Lab 03/16/17 0230 03/17/17 0213 03/18/17 0223 03/19/17 0522 03/20/17 0530  NA 136 134* 136 134* 134*  K 3.5 3.5 3.2* 3.2* 3.3*  CL 101 99* 98* 98* 99*  CO2 25 23 28 25 27   GLUCOSE 170* 190* 104* 133* 194*  BUN 17 11 6 8 6   CREATININE 1.95* 1.71* 1.44* 1.51* 1.46*  CALCIUM 8.5* 8.6* 8.6* 8.3* 8.2*   GFR: Estimated Creatinine Clearance: 74.3 mL/min (A) (by C-G formula based on SCr of 1.46 mg/dL (H)). Liver Function Tests: No results for input(s): AST, ALT, ALKPHOS, BILITOT, PROT, ALBUMIN in the last 168 hours. No results for input(s): LIPASE, AMYLASE in the last 168 hours. No results for input(s): AMMONIA in the last 168 hours. Coagulation Profile: No results for input(s): INR, PROTIME in the last 168 hours. Cardiac Enzymes: Recent Labs  Lab 03/16/17 1758  CKTOTAL 113   BNP (last 3 results) No results for input(s): PROBNP in the last 8760 hours. HbA1C: No results for input(s): HGBA1C in the last 72 hours. CBG: Recent Labs  Lab 03/19/17 1212 03/19/17 1658 03/19/17 2231 03/20/17 0750 03/20/17 1152  GLUCAP 160* 163* 177* 177* 188*   Lipid Profile: No results for input(s): CHOL, HDL, LDLCALC, TRIG, CHOLHDL, LDLDIRECT in the last 72 hours. Thyroid Function Tests: No results for input(s): TSH, T4TOTAL, FREET4, T3FREE, THYROIDAB in the last 72 hours. Anemia Panel: No results for input(s): VITAMINB12, FOLATE, FERRITIN, TIBC, IRON, RETICCTPCT in the last 72 hours. Urine analysis:    Component Value Date/Time   COLORURINE YELLOW 11/18/2016 1429   APPEARANCEUR CLOUDY (A) 11/18/2016 1429   LABSPEC >=1.030 01/01/2017 1032   PHURINE 5.5 01/01/2017 1032   GLUCOSEU 250 (A) 01/01/2017 1032   HGBUR SMALL (A) 01/01/2017 1032   BILIRUBINUR NEGATIVE 01/01/2017 1032   KETONESUR NEGATIVE 01/01/2017 1032   PROTEINUR 100 (A) 01/01/2017 1032    UROBILINOGEN 1.0 01/01/2017 1032   NITRITE NEGATIVE 01/01/2017 1032   LEUKOCYTESUR NEGATIVE 01/01/2017 1032   Sepsis Labs: @LABRCNTIP (procalcitonin:4,lacticidven:4)  ) Recent Results (from the past 240 hour(s))  Wound or Superficial Culture     Status: None   Collection Time: 03/15/17  3:28 AM  Result Value Ref Range Status   Specimen Description WOUND RIGHT FOOT  Final   Special Requests NONE  Final   Gram Stain   Final    ABUNDANT WBC PRESENT, PREDOMINANTLY PMN ABUNDANT GRAM NEGATIVE RODS ABUNDANT GRAM POSITIVE COCCI    Culture   Final    RARE STAPHYLOCOCCUS AUREUS RARE GROUP B STREP(S.AGALACTIAE)ISOLATED TESTING AGAINST S. AGALACTIAE NOT ROUTINELY PERFORMED DUE TO PREDICTABILITY OF AMP/PEN/VAN SUSCEPTIBILITY. WITHIN MIXED CULTURE    Report Status 03/18/2017 FINAL  Final   Organism ID, Bacteria STAPHYLOCOCCUS AUREUS  Final      Susceptibility   Staphylococcus aureus - MIC*  CIPROFLOXACIN <=0.5 SENSITIVE Sensitive     ERYTHROMYCIN >=8 RESISTANT Resistant     GENTAMICIN <=0.5 SENSITIVE Sensitive     OXACILLIN <=0.25 SENSITIVE Sensitive     TETRACYCLINE <=1 SENSITIVE Sensitive     VANCOMYCIN <=0.5 SENSITIVE Sensitive     TRIMETH/SULFA <=10 SENSITIVE Sensitive     CLINDAMYCIN RESISTANT Resistant     RIFAMPIN <=0.5 SENSITIVE Sensitive     Inducible Clindamycin POSITIVE Resistant     * RARE STAPHYLOCOCCUS AUREUS  Blood culture (routine x 2)     Status: None (Preliminary result)   Collection Time: 03/15/17  3:55 AM  Result Value Ref Range Status   Specimen Description BLOOD RIGHT ANTECUBITAL  Final   Special Requests   Final    BOTTLES DRAWN AEROBIC AND ANAEROBIC Blood Culture adequate volume   Culture NO GROWTH 4 DAYS  Final   Report Status PENDING  Incomplete  Blood culture (routine x 2)     Status: None (Preliminary result)   Collection Time: 03/15/17  4:00 AM  Result Value Ref Range Status   Specimen Description BLOOD LEFT ANTECUBITAL  Final   Special Requests    Final    BOTTLES DRAWN AEROBIC AND ANAEROBIC Blood Culture adequate volume   Culture NO GROWTH 4 DAYS  Final   Report Status PENDING  Incomplete  MRSA PCR Screening     Status: None   Collection Time: 03/15/17  2:45 PM  Result Value Ref Range Status   MRSA by PCR NEGATIVE NEGATIVE Final    Comment:        The GeneXpert MRSA Assay (FDA approved for NASAL specimens only), is one component of a comprehensive MRSA colonization surveillance program. It is not intended to diagnose MRSA infection nor to guide or monitor treatment for MRSA infections.   Surgical pcr screen     Status: None   Collection Time: 03/18/17  1:21 AM  Result Value Ref Range Status   MRSA, PCR NEGATIVE NEGATIVE Final   Staphylococcus aureus NEGATIVE NEGATIVE Final    Comment: (NOTE) The Xpert SA Assay (FDA approved for NASAL specimens in patients 31 years of age and older), is one component of a comprehensive surveillance program. It is not intended to diagnose infection nor to guide or monitor treatment.   Aerobic/Anaerobic Culture (surgical/deep wound)     Status: None (Preliminary result)   Collection Time: 03/18/17 10:23 AM  Result Value Ref Range Status   Specimen Description TISSUE RIGHT FOOT  Final   Special Requests NONE  Final   Gram Stain   Final    FEW WBC PRESENT, PREDOMINANTLY PMN NO ORGANISMS SEEN    Culture   Final    RARE GROUP B STREP(S.AGALACTIAE)ISOLATED TESTING AGAINST S. AGALACTIAE NOT ROUTINELY PERFORMED DUE TO PREDICTABILITY OF AMP/PEN/VAN SUSCEPTIBILITY. CRITICAL RESULT CALLED TO, READ BACK BY AND VERIFIED WITH: E. MCAULEY RN, AT 9211 03/19/17 BY D. VANHOOK REGARDING CULTURE GROWTH    Report Status PENDING  Incomplete      Studies: No results found.  Scheduled Meds: . abacavir-dolutegravir-lamiVUDine  1 tablet Oral Daily  . amLODipine  10 mg Oral Daily  . darunavir-cobicistat  1 tablet Oral Daily  . feeding supplement (GLUCERNA SHAKE)  237 mL Oral TID BM  . feeding  supplement (PRO-STAT SUGAR FREE 64)  30 mL Oral BID  . heparin injection (subcutaneous)  5,000 Units Subcutaneous Q8H  . insulin aspart  0-15 Units Subcutaneous TID WC  . insulin aspart  0-5 Units Subcutaneous QHS  .  insulin aspart  5 Units Subcutaneous TID WC  . insulin glargine  30 Units Subcutaneous Daily    Continuous Infusions: . sodium chloride 100 mL/hr at 03/20/17 1210  . sodium chloride    . ampicillin-sulbactam (UNASYN) IV 3 g (03/20/17 1206)  . lactated ringers 10 mL/hr at 03/18/17 0926     LOS: 5 days     Alma Friendly, MD [facility]   If 7PM-7AM, please contact night-coverage [email or web address] Password [facility] 03/20/2017, 12:24 PM

## 2017-03-20 NOTE — Clinical Social Work Note (Signed)
Clinical Social Work Assessment  Patient Details  Name: Keith [name] MRN: [id number] Date of Birth: Oct 07, 1965  Date of referral:  03/20/17               Reason for consult:  Facility Placement                Permission sought to share information with:  Facility Art therapist granted to share information::  Yes, Verbal Permission Granted  Name::        Agency::  SNFs  Relationship::     Contact Information:     Housing/Transportation Living arrangements for the past 2 months:  Apartment Source of Information:  Patient Patient Interpreter Needed:  None Criminal Activity/Legal Involvement Pertinent to Current Situation/Hospitalization:  No - Comment as needed Significant Relationships:  Siblings Lives with:  Siblings Do you feel safe going back to the place where you live?  Yes Need for family participation in patient care:  No (Coment)  Care giving concerns:  CSW received consult for possible SNF placement at time of discharge. CSW spoke with patient regarding PT recommendation of SNF placement at time of discharge. Patient reported that he would have to think about SNF placement. CSW to continue to follow and assist with discharge planning needs.   Social Worker assessment / plan:  CSW spoke with patient concerning possibility of [facility] at [facility] before returning home.  Employment status:  Disabled (Comment on whether or not currently receiving Disability) Insurance information:  Medicare, Medicaid In [place] PT Recommendations:  [place] / Referral to community resources:  [place]  Patient/Family's Response to care:  Patient states he does not want to go to a nursing home. CSW explained that it would only be for short term rehab and then he would return home. He states he will think about it and consult with his brother. CSW provided SNF list.  Patient/Family's Understanding of and Emotional Response to Diagnosis,  Current Treatment, and Prognosis:  Patient/family is realistic regarding therapy needs and expressed being hopeful for return home. Patient expressed understanding of CSW role and discharge process as well as medical condition. No questions/concerns about plan or treatment.    Emotional Assessment Appearance:  Appears stated age Attitude/Demeanor/Rapport:  Other(Appropriate, Flat) Affect (typically observed):  Appropriate, Flat Orientation:  Oriented to Self, Oriented to Place, Oriented to  Time, Oriented to Situation Alcohol / Substance use:  Not Applicable Psych involvement (Current and /or in the community):  No (Comment)  Discharge Needs  Concerns to be addressed:  Care Coordination Readmission within the last 30 days:  No Current discharge risk:  None Barriers to Discharge:  Continued Medical Work up   [employer], [place] 03/20/2017, 3:21 PM

## 2017-03-21 ENCOUNTER — Encounter (HOSPITAL_COMMUNITY)
Admission: EM | Disposition: A | Discharge status: Skilled Nursing Facility | Payer: Self-pay | Source: Home / Self Care | Attending: Internal Medicine

## 2017-03-21 ENCOUNTER — Encounter (HOSPITAL_COMMUNITY): Payer: Self-pay | Admitting: *Deleted

## 2017-03-21 ENCOUNTER — Inpatient Hospital Stay (HOSPITAL_COMMUNITY): Payer: Medicare Other | Admitting: Certified Registered Nurse Anesthetist

## 2017-03-21 HISTORY — PX: SKIN SPLIT GRAFT: SHX444

## 2017-03-21 LAB — CBC WITH DIFFERENTIAL/PLATELET
Basophils Absolute: 0 10*3/uL (ref 0.0–0.1)
Basophils Relative: 0 %
Eosinophils Absolute: 0.3 10*3/uL (ref 0.0–0.7)
Eosinophils Relative: 2 %
HCT: 28.7 % — ABNORMAL LOW (ref 39.0–52.0)
Hemoglobin: 9.2 g/dL — ABNORMAL LOW (ref 13.0–17.0)
Lymphocytes Relative: 17 %
Lymphs Abs: 1.7 10*3/uL (ref 0.7–4.0)
MCH: 27 pg (ref 26.0–34.0)
MCHC: 32.1 g/dL (ref 30.0–36.0)
MCV: 84.2 fL (ref 78.0–100.0)
Monocytes Absolute: 0.6 10*3/uL (ref 0.1–1.0)
Monocytes Relative: 6 %
Neutro Abs: 7.7 10*3/uL (ref 1.7–7.7)
Neutrophils Relative %: 75 %
Platelets: 388 10*3/uL (ref 150–400)
RBC: 3.41 MIL/uL — ABNORMAL LOW (ref 4.22–5.81)
RDW: 13.2 % (ref 11.5–15.5)
WBC: 10.3 10*3/uL (ref 4.0–10.5)

## 2017-03-21 LAB — GLUCOSE, CAPILLARY
Glucose-Capillary: 98 mg/dL (ref 65–99)
Glucose-Capillary: 101 mg/dL — ABNORMAL HIGH (ref 65–99)
Glucose-Capillary: 120 mg/dL — ABNORMAL HIGH (ref 65–99)
Glucose-Capillary: 114 mg/dL — ABNORMAL HIGH (ref 65–99)
Glucose-Capillary: 227 mg/dL — ABNORMAL HIGH (ref 65–99)
Glucose-Capillary: 277 mg/dL — ABNORMAL HIGH (ref 65–99)

## 2017-03-21 LAB — BASIC METABOLIC PANEL
Anion gap: 10 (ref 5–15)
BUN: <5 mg/dL — ABNORMAL LOW (ref 6–20)
CO2: 27 mmol/L (ref 22–32)
Calcium: 8.8 mg/dL — ABNORMAL LOW (ref 8.9–10.3)
Chloride: 101 mmol/L (ref 101–111)
Creatinine, Ser: 1.25 mg/dL — ABNORMAL HIGH (ref 0.61–1.24)
GFR calc Af Amer: >60 mL/min (ref >60)
GFR calc non Af Amer: >60 mL/min (ref >60)
Glucose, Bld: 103 mg/dL — ABNORMAL HIGH (ref 65–99)
Potassium: 3.1 mmol/L — ABNORMAL LOW (ref 3.5–5.1)
Sodium: 138 mmol/L (ref 135–145)

## 2017-03-21 SURGERY — APPLICATION, GRAFT, SKIN, SPLIT-THICKNESS
Anesthesia: General | Laterality: Right

## 2017-03-21 MED ORDER — PHENYLEPHRINE 40 MCG/ML (10ML) SYRINGE FOR IV PUSH (FOR BLOOD PRESSURE SUPPORT)
PREFILLED_SYRINGE | INTRAVENOUS | Status: AC
  Filled 2017-03-21: qty 10

## 2017-03-21 MED ORDER — FENTANYL CITRATE (PF) 250 MCG/5ML IJ SOLN
INTRAMUSCULAR | Status: AC
  Filled 2017-03-21: qty 5

## 2017-03-21 MED ORDER — LIDOCAINE 2% (20 MG/ML) 5 ML SYRINGE
INTRAMUSCULAR | Status: AC
  Filled 2017-03-21: qty 10

## 2017-03-21 MED ORDER — PROMETHAZINE HCL 25 MG/ML IJ SOLN: 6.2500 mg | INTRAMUSCULAR | Status: DC | PRN

## 2017-03-21 MED ORDER — CHLORHEXIDINE GLUCONATE 4 % EX LIQD
60.0000 mL | Freq: Once | CUTANEOUS | Status: AC
  Administered 2017-03-21: 4 via TOPICAL
  Filled 2017-03-21: qty 60

## 2017-03-21 MED ORDER — FENTANYL CITRATE (PF) 250 MCG/5ML IJ SOLN
INTRAMUSCULAR | Status: DC | PRN
  Administered 2017-03-21 (×2): 25 ug via INTRAVENOUS

## 2017-03-21 MED ORDER — ONDANSETRON HCL 4 MG/2ML IJ SOLN
INTRAMUSCULAR | Status: AC
  Filled 2017-03-21: qty 2

## 2017-03-21 MED ORDER — HYDROMORPHONE HCL 1 MG/ML IJ SOLN: 0.2500 mg | INTRAMUSCULAR | Status: DC | PRN

## 2017-03-21 MED ORDER — MIDAZOLAM HCL 2 MG/2ML IJ SOLN
INTRAMUSCULAR | Status: AC
  Filled 2017-03-21: qty 2

## 2017-03-21 MED ORDER — PROPOFOL 10 MG/ML IV BOLUS
INTRAVENOUS | Status: AC
  Filled 2017-03-21: qty 20

## 2017-03-21 MED ORDER — PHENYLEPHRINE 40 MCG/ML (10ML) SYRINGE FOR IV PUSH (FOR BLOOD PRESSURE SUPPORT)
PREFILLED_SYRINGE | INTRAVENOUS | Status: DC | PRN
  Administered 2017-03-21: 80 ug via INTRAVENOUS

## 2017-03-21 MED ORDER — DEXAMETHASONE SODIUM PHOSPHATE 10 MG/ML IJ SOLN
INTRAMUSCULAR | Status: AC
  Filled 2017-03-21: qty 1

## 2017-03-21 MED ORDER — POTASSIUM CHLORIDE CRYS ER 20 MEQ PO TBCR
40.0000 meq | EXTENDED_RELEASE_TABLET | Freq: Once | ORAL | Status: AC
  Administered 2017-03-21: 40 meq via ORAL
  Filled 2017-03-21: qty 2

## 2017-03-21 MED ORDER — CEFAZOLIN SODIUM-DEXTROSE 2-4 GM/100ML-% IV SOLN
2.0000 g | INTRAVENOUS | Status: AC
  Administered 2017-03-21: 2 g via INTRAVENOUS
  Filled 2017-03-21: qty 100

## 2017-03-21 MED ORDER — KETOROLAC TROMETHAMINE 30 MG/ML IJ SOLN: 30.0000 mg | Freq: Once | INTRAMUSCULAR | Status: DC | PRN

## 2017-03-21 MED ORDER — MEPERIDINE HCL 25 MG/ML IJ SOLN: 6.2500 mg | INTRAMUSCULAR | Status: DC | PRN

## 2017-03-21 MED ORDER — ONDANSETRON HCL 4 MG/2ML IJ SOLN
INTRAMUSCULAR | Status: DC | PRN
  Administered 2017-03-21: 4 mg via INTRAVENOUS

## 2017-03-21 MED ORDER — SODIUM CHLORIDE 0.9 % IR SOLN
Status: DC | PRN
  Administered 2017-03-21: 3000 mL

## 2017-03-21 MED ORDER — MIDAZOLAM HCL 5 MG/5ML IJ SOLN
INTRAMUSCULAR | Status: DC | PRN
  Administered 2017-03-21: 2 mg via INTRAVENOUS

## 2017-03-21 MED ORDER — DEXAMETHASONE SODIUM PHOSPHATE 10 MG/ML IJ SOLN
INTRAMUSCULAR | Status: DC | PRN
  Administered 2017-03-21: 5 mg via INTRAVENOUS

## 2017-03-21 MED ORDER — LACTATED RINGERS IV SOLN
INTRAVENOUS | Status: DC
  Administered 2017-03-21: 10:00:00 via INTRAVENOUS

## 2017-03-21 MED ORDER — PROPOFOL 10 MG/ML IV BOLUS
INTRAVENOUS | Status: DC | PRN
  Administered 2017-03-21: 200 mg via INTRAVENOUS

## 2017-03-21 MED ORDER — LIDOCAINE 2% (20 MG/ML) 5 ML SYRINGE
INTRAMUSCULAR | Status: DC | PRN
  Administered 2017-03-21: 100 mg via INTRAVENOUS

## 2017-03-21 SURGICAL SUPPLY — 42 items
ALLOGRAFT SKIN MESHD 125 SQ CM (Tissue) ×1 IMPLANT
BNDG CMPR 9X4 STRL LF SNTH (GAUZE/BANDAGES/DRESSINGS) ×1
BNDG COHESIVE 6X5 TAN STRL LF (GAUZE/BANDAGES/DRESSINGS) ×1 IMPLANT
BNDG ESMARK 4X9 LF (GAUZE/BANDAGES/DRESSINGS) ×2 IMPLANT
COVER SURGICAL LIGHT HANDLE (MISCELLANEOUS) ×3 IMPLANT
CUFF TOURNIQUET SINGLE 18IN (TOURNIQUET CUFF) IMPLANT
CUFF TOURNIQUET SINGLE 24IN (TOURNIQUET CUFF) IMPLANT
DRAPE U-SHAPE 47X51 STRL (DRAPES) ×2 IMPLANT
DRESSING VERAFLO CLEANSE CC (GAUZE/BANDAGES/DRESSINGS) IMPLANT
DRSG VERAFLO CLEANSE CC (GAUZE/BANDAGES/DRESSINGS) ×2
DURAPREP 26ML APPLICATOR (WOUND CARE) ×2 IMPLANT
ELECT REM PT RETURN 9FT ADLT (ELECTROSURGICAL) ×2
ELECTRODE REM PT RTRN 9FT ADLT (ELECTROSURGICAL) ×1 IMPLANT
GAUZE SPONGE 4X4 12PLY STRL (GAUZE/BANDAGES/DRESSINGS) IMPLANT
GLOVE BIOGEL PI IND STRL 9 (GLOVE) ×1 IMPLANT
GLOVE BIOGEL PI INDICATOR 9 (GLOVE) ×1
GLOVE SURG ORTHO 9.0 STRL STRW (GLOVE) ×2 IMPLANT
GOWN STRL REUS W/ TWL XL LVL3 (GOWN DISPOSABLE) ×2 IMPLANT
GOWN STRL REUS W/TWL XL LVL3 (GOWN DISPOSABLE) ×4
GRAFT TISS MESH 125 BURN (Tissue) IMPLANT
KIT BASIN OR (CUSTOM PROCEDURE TRAY) ×2 IMPLANT
KIT ROOM TURNOVER OR (KITS) ×2 IMPLANT
MANIFOLD NEPTUNE II (INSTRUMENTS) ×2 IMPLANT
MICROMATRIX 1000MG (Tissue) ×2 IMPLANT
NDL HYPO 25GX1X1/2 BEV (NEEDLE) IMPLANT
NEEDLE HYPO 25GX1X1/2 BEV (NEEDLE) IMPLANT
NS IRRIG 1000ML POUR BTL (IV SOLUTION) ×2 IMPLANT
PACK ORTHO EXTREMITY (CUSTOM PROCEDURE TRAY) ×2 IMPLANT
PAD ARMBOARD 7.5X6 YLW CONV (MISCELLANEOUS) ×4 IMPLANT
PAD CAST 4YDX4 CTTN HI CHSV (CAST SUPPLIES) IMPLANT
PAD NEG PRESSURE SENSATRAC (MISCELLANEOUS) ×1 IMPLANT
PADDING CAST COTTON 4X4 STRL (CAST SUPPLIES)
SKIN MESHED 125 SQ CM (Tissue) ×2 IMPLANT
SOLUTION PARTIC MCRMTRX 1000MG (Tissue) IMPLANT
SUCTION FRAZIER HANDLE 10FR (MISCELLANEOUS)
SUCTION TUBE FRAZIER 10FR DISP (MISCELLANEOUS) IMPLANT
SUT ETHILON 4 0 PS 2 18 (SUTURE) ×2 IMPLANT
SYR CONTROL 10ML LL (SYRINGE) IMPLANT
TOWEL OR 17X24 6PK STRL BLUE (TOWEL DISPOSABLE) ×2 IMPLANT
TOWEL OR 17X26 10 PK STRL BLUE (TOWEL DISPOSABLE) ×2 IMPLANT
TUBE CONNECTING 12X1/4 (SUCTIONS) IMPLANT
WATER STERILE IRR 1000ML POUR (IV SOLUTION) ×1 IMPLANT

## 2017-03-21 NOTE — Op Note (Signed)
03/21/2017  10:42 AM  PATIENT:  Keith [name]    PRE-OPERATIVE DIAGNOSIS:  Wound Right Foot  POST-OPERATIVE DIAGNOSIS:  Same  PROCEDURE:  IRRIGATION AND DEBRIDEMENT RIGHT FOOT AND APPLY SPLIT THICKNESS SKIN GRAFT AND WOUND VAC, and application of a cell 9675 mg Local tissue rearrangement for wound rearrangement for wound closure 11 x 5 cm. Excision skin and soft tissue muscle and tendon.  SURGEON:  [name], MD  PHYSICIAN ASSISTANT:None ANESTHESIA:   General  PREOPERATIVE INDICATIONS:  Keith [name] is a  51 y.o. male with a diagnosis of Wound Right Foot who failed conservative measures and elected for surgical management.    The risks benefits and alternatives were discussed with the patient preoperatively including but not limited to the risks of infection, bleeding, nerve injury, cardiopulmonary complications, the need for revision surgery, among others, and the patient was willing to proceed.  OPERATIVE IMPLANTS: Allograft skin graft and a cell xenograft  OPERATIVE FINDINGS: Good granulation tissue.  No abscess  OPERATIVE PROCEDURE: Patient was brought the operating room and underwent a general anesthetic.  After adequate levels of anesthesia were obtained patient's right lower extremity was prepped using DuraPrep draped into a sterile field a timeout was called.  Examination patient had approximately 90% healthy granulation tissue the wound bed.  They rondure and knife were used to further debride nonviable tissue tendon muscle fascia and skin was sharply excised.  The wound was further irrigated and debrided.  Patient also had further debridement of the wound over the lateral aspect of his foot.  After debridement local tissue rearrangement was used for wound closure 11.5 cm with 2-0 nylon.  The depth of the wound was then filled with a cell xenograft both the plantar and lateral wound.  This was then covered with allograft split-thickness skin graft and stapled in  place.  A reticulated cleanse choice dressing was then applied over the skin grafts there was a bridge placed on top of [place].  A [place] dressing was applied this had a good suction fit patient was extubated taken the PACU in stable condition.   DISCHARGE PLANNING:  Antibiotic duration: Continue IV antibiotics for 3 days postoperatively.  Weightbearing: Nonweightbearing right lower extremity.  Pain medication: As needed.  Dressing care/ Wound VAC: Patient will need discharge with the [place] wound VAC at the time of discharge.  Ambulatory devices: Patient will need kneeling scooter or walker for strict nonweightbearing right lower extremity.  Discharge to: Patient most likely will need discharge to skilled nursing for strict nonweightbearing right lower extremity and continuation with a incisional [place] wound VAC for 1 week postoperatively.  The portable pump will be obtained from the operating room.  Follow-up: In the office 1 week post operative.

## 2017-03-21 NOTE — Progress Notes (Signed)
PROGRESS NOTE    Keith [name]  QPR:[id number] DOB: 1965-05-08 DOA: 03/15/2017 PCP: Scot Jun, FNP   Brief Narrative:51 y.o.malewithhistory of HIV, diabetes mellitus type 2, hypertension presents to the ER with complaints of increasing pain and swelling in the right foot for the last few days. Patient also noticed increasing discharge from the foot ulcer.Patient states he has been compliant with his anti-retroviral's and Lantus dose.On exam, he has a first metatarsal ulcer on plantar surface as well as skin laceration on lateral edge of foot. MRI right foot completed showedfirst MTP joint osteomyelitisas well as abscess. Orthopedic surgery was consulted as well as ID.  Today, pt reported no new complains except R foot pain. Pt denies any chest pain, SOB, abdominal pain, N/V/D/C, fever/chills.  03/21/2017 patient had incision and drainage done by [name].  The report shows he had good granulation tissue there was no evidence of abscess.  He had a skin graft as well as a cell xenograft.  Assessment & Plan:   Principal Problem:   Sepsis ([place]) Active Problems:   Essential hypertension   Insulin-requiring or dependent type II diabetes mellitus (HCC)   HIV disease (HCC)   CKD (chronic kidney disease) stage 3, GFR 30-59 ml/min (HCC)   Abscess of right foot   #Sepsis secondary to osteomyelitis of right first MTP, DM wound  Resolving -Currently afebrile, with resolving leukocytosis -Ortho and ID consulted -ABInormal -S/p incision and drainage by Dr. Louanne Skye 12/8 -S/p irrigation and debridement, excision with decompression of abscess of sesamoid bone,    wound vac placement by [name] 12/11 -Wound cx growing GBS, MSSA -BC NGTD -Started on IV Unasyn on 12/12, for a total of 4 weeks then onto PO for several months as per ID -s/p  IV zosyn, dapto He was taken to the OR today for repeat incision and drainage and placement of skin graft.  Plan would be to continue IV  antibiotics 3 days after I&D which was done 03/21/2017.  And then discharge him to skilled nursing facility as patient is completely nonweightbearing to the right lower extremity.  Will obtain social worker consult. #AKI  -Ongoing -Baseline Cr 1 -Renal USS done on 09/2016: Normal size kidneys without hydronephrosis. Mild increased   cortical echogenicity which can be seen with medical renal disease.  -Continue IVF  #DM type 2, uncontrolled with hyperglycemia -Ha1c 10.1 -DM coordinator consult  -Lantus, Novolog, SSI  #HIV -Continue home meds   #HTN -Continue norvasc  #Hypokalemia -Replace prn    DVT prophylaxis: Heparin Code Status: Full code Disposition Plan: Social worker consult for discharge to skilled nursing facility. Consultants: Bobette Mo  Procedures: Status post incision and drainage Antimicrobials: IV Unasyn status post Zosyn and daptomycin  Subjective: Resting in bed complaining of was not able to sleep last night due to several factors.  Objective: He is resting in bed in no acute distress Vitals:   03/21/17 0934 03/21/17 1049 03/21/17 1052 03/21/17 1100  BP:   129/87 133/88  Pulse:   84 81  Resp:    16  Temp:  97.9 F (36.6 C) 98.3 F (36.8 C)   TempSrc:  Oral    SpO2:    96%  Weight: 115.7 kg (255 lb)     Height: 5\' 9"  (1.753 m)       Intake/Output Summary (Last 24 hours) at 03/21/2017 1324 Last data filed at 03/21/2017 1050 Gross per 24 hour  Intake 4590.99 ml  Output 2585 ml  Net 2005.99  ml   Filed Weights   03/19/17 0656 03/20/17 0656 03/21/17 0934  Weight: 115.8 kg (255 lb 4.7 oz) 115.8 kg (255 lb 4.7 oz) 115.7 kg (255 lb)    Examination:  General exam: Appears calm and comfortable  Respiratory system: Clear to auscultation. Respiratory effort normal. Cardiovascular system: S1 & S2 heard, RRR. No JVD, murmurs, rubs, gallops or clicks. No pedal edema. Gastrointestinal system: Abdomen is nondistended, soft and nontender. No  organomegaly or masses felt. Normal bowel sounds heard. Central nervous system: Alert and oriented. No focal neurological deficits. Extremities: Symmetric 5 x 5 power. Skin: No rashes, lesions or ulcers Psychiatry: Judgement and insight appear normal. Mood & affect appropriate.     Data Reviewed: I have personally reviewed following labs and imaging studies  CBC: Recent Labs  Lab 03/15/17 0348  03/17/17 0213 03/18/17 0223 03/19/17 0522 03/20/17 0530 03/21/17 0345  WBC 21.2*   < > 13.8* 12.6* 12.5* 11.0* 10.3  NEUTROABS 18.2*  --   --   --   --  7.8* 7.7  HGB 9.9*   < > 9.6* 9.6* 8.8* 8.4* 9.2*  HCT 29.7*   < > 29.2* 29.0* 26.8* 26.2* 28.7*  MCV 84.9   < > 83.7 83.8 84.3 85.1 84.2  PLT 252   < > 294 331 313 329 388   < > = values in this interval not displayed.   Basic Metabolic Panel: Recent Labs  Lab 03/17/17 0213 03/18/17 0223 03/19/17 0522 03/20/17 0530 03/21/17 0345  NA 134* 136 134* 134* 138  K 3.5 3.2* 3.2* 3.3* 3.1*  CL 99* 98* 98* 99* 101  CO2 23 28 25 27 27   GLUCOSE 190* 104* 133* 194* 103*  BUN 11 6 8 6  <5*  CREATININE 1.71* 1.44* 1.51* 1.46* 1.25*  CALCIUM 8.6* 8.6* 8.3* 8.2* 8.8*   GFR: Estimated Creatinine Clearance: 86.7 mL/min (A) (by C-G formula based on SCr of 1.25 mg/dL (H)). Liver Function Tests: No results for input(s): AST, ALT, ALKPHOS, BILITOT, PROT, ALBUMIN in the last 168 hours. No results for input(s): LIPASE, AMYLASE in the last 168 hours. No results for input(s): AMMONIA in the last 168 hours. Coagulation Profile: No results for input(s): INR, PROTIME in the last 168 hours. Cardiac Enzymes: Recent Labs  Lab 03/16/17 1758  CKTOTAL 113   BNP (last 3 results) No results for input(s): PROBNP in the last 8760 hours. HbA1C: No results for input(s): HGBA1C in the last 72 hours. CBG: Recent Labs  Lab 03/20/17 2106 03/21/17 0801 03/21/17 0942 03/21/17 1055 03/21/17 1150  GLUCAP 129* 98 101* 120* 114*   Lipid Profile: No  results for input(s): CHOL, HDL, LDLCALC, TRIG, CHOLHDL, LDLDIRECT in the last 72 hours. Thyroid Function Tests: No results for input(s): TSH, T4TOTAL, FREET4, T3FREE, THYROIDAB in the last 72 hours. Anemia Panel: No results for input(s): VITAMINB12, FOLATE, FERRITIN, TIBC, IRON, RETICCTPCT in the last 72 hours. Sepsis Labs: Recent Labs  Lab 03/15/17 0400  LATICACIDVEN 1.23    Recent Results (from the past 240 hour(s))  Wound or Superficial Culture     Status: None   Collection Time: 03/15/17  3:28 AM  Result Value Ref Range Status   Specimen Description WOUND RIGHT FOOT  Final   Special Requests NONE  Final   Gram Stain   Final    ABUNDANT WBC PRESENT, PREDOMINANTLY PMN ABUNDANT GRAM NEGATIVE RODS ABUNDANT GRAM POSITIVE COCCI    Culture   Final    RARE STAPHYLOCOCCUS AUREUS RARE GROUP  B STREP(S.AGALACTIAE)ISOLATED TESTING AGAINST S. AGALACTIAE NOT ROUTINELY PERFORMED DUE TO PREDICTABILITY OF AMP/PEN/VAN SUSCEPTIBILITY. WITHIN MIXED CULTURE    Report Status 03/18/2017 FINAL  Final   Organism ID, Bacteria STAPHYLOCOCCUS AUREUS  Final      Susceptibility   Staphylococcus aureus - MIC*    CIPROFLOXACIN <=0.5 SENSITIVE Sensitive     ERYTHROMYCIN >=8 RESISTANT Resistant     GENTAMICIN <=0.5 SENSITIVE Sensitive     OXACILLIN <=0.25 SENSITIVE Sensitive     TETRACYCLINE <=1 SENSITIVE Sensitive     VANCOMYCIN <=0.5 SENSITIVE Sensitive     TRIMETH/SULFA <=10 SENSITIVE Sensitive     CLINDAMYCIN RESISTANT Resistant     RIFAMPIN <=0.5 SENSITIVE Sensitive     Inducible Clindamycin POSITIVE Resistant     * RARE STAPHYLOCOCCUS AUREUS  Blood culture (routine x 2)     Status: None   Collection Time: 03/15/17  3:55 AM  Result Value Ref Range Status   Specimen Description BLOOD RIGHT ANTECUBITAL  Final   Special Requests   Final    BOTTLES DRAWN AEROBIC AND ANAEROBIC Blood Culture adequate volume   Culture NO GROWTH 5 DAYS  Final   Report Status 03/20/2017 FINAL  Final  Blood culture  (routine x 2)     Status: None   Collection Time: 03/15/17  4:00 AM  Result Value Ref Range Status   Specimen Description BLOOD LEFT ANTECUBITAL  Final   Special Requests   Final    BOTTLES DRAWN AEROBIC AND ANAEROBIC Blood Culture adequate volume   Culture NO GROWTH 5 DAYS  Final   Report Status 03/20/2017 FINAL  Final  MRSA PCR Screening     Status: None   Collection Time: 03/15/17  2:45 PM  Result Value Ref Range Status   MRSA by PCR NEGATIVE NEGATIVE Final    Comment:        The GeneXpert MRSA Assay (FDA approved for NASAL specimens only), is one component of a comprehensive MRSA colonization surveillance program. It is not intended to diagnose MRSA infection nor to guide or monitor treatment for MRSA infections.   Surgical pcr screen     Status: None   Collection Time: 03/18/17  1:21 AM  Result Value Ref Range Status   MRSA, PCR NEGATIVE NEGATIVE Final   Staphylococcus aureus NEGATIVE NEGATIVE Final    Comment: (NOTE) The Xpert SA Assay (FDA approved for NASAL specimens in patients 28 years of age and older), is one component of a comprehensive surveillance program. It is not intended to diagnose infection nor to guide or monitor treatment.   Aerobic/Anaerobic Culture (surgical/deep wound)     Status: None (Preliminary result)   Collection Time: 03/18/17 10:23 AM  Result Value Ref Range Status   Specimen Description TISSUE RIGHT FOOT  Final   Special Requests NONE  Final   Gram Stain   Final    FEW WBC PRESENT, PREDOMINANTLY PMN NO ORGANISMS SEEN    Culture   Final    RARE GROUP B STREP(S.AGALACTIAE)ISOLATED TESTING AGAINST S. AGALACTIAE NOT ROUTINELY PERFORMED DUE TO PREDICTABILITY OF AMP/PEN/VAN SUSCEPTIBILITY. RARE ENTEROCOCCUS FAECALIS NO ANAEROBES ISOLATED; CULTURE IN PROGRESS FOR 5 DAYS CRITICAL RESULT CALLED TO, READ BACK BY AND VERIFIED WITH: E. MCAULEY RN, AT 1914 03/19/17 BY D. VANHOOK REGARDING CULTURE GROWTH    Report Status PENDING  Incomplete    Organism ID, Bacteria ENTEROCOCCUS FAECALIS  Final      Susceptibility   Enterococcus faecalis - MIC*    AMPICILLIN <=2 SENSITIVE Sensitive  VANCOMYCIN 1 SENSITIVE Sensitive     GENTAMICIN SYNERGY SENSITIVE Sensitive     * RARE ENTEROCOCCUS FAECALIS         Radiology Studies: No results found.      Scheduled Meds: . abacavir-dolutegravir-lamiVUDine  1 tablet Oral Daily  . amLODipine  10 mg Oral Daily  . darunavir-cobicistat  1 tablet Oral Daily  . docusate sodium  100 mg Oral BID  . feeding supplement (GLUCERNA SHAKE)  237 mL Oral TID BM  . feeding supplement (PRO-STAT SUGAR FREE 64)  30 mL Oral BID  . heparin injection (subcutaneous)  5,000 Units Subcutaneous Q8H  . insulin aspart  0-15 Units Subcutaneous TID WC  . insulin aspart  0-5 Units Subcutaneous QHS  . insulin aspart  5 Units Subcutaneous TID WC  . insulin glargine  30 Units Subcutaneous Daily   Continuous Infusions: . sodium chloride 100 mL/hr at 03/21/17 1237  . sodium chloride 10 mL/hr at 03/20/17 2255  . ampicillin-sulbactam (UNASYN) IV 3 g (03/21/17 1300)  . lactated ringers 10 mL/hr at 03/18/17 0926  . lactated ringers 10 mL/hr at 03/21/17 0945  . methocarbamol (ROBAXIN)  IV       LOS: 6 days     Georgette Shell, MD [facility]  If 7PM-7AM, please contact night-coverage [email or web address] Password [email or web address] 03/21/2017, 1:24 PM

## 2017-03-21 NOTE — Transfer of Care (Signed)
Immediate Anesthesia Transfer of Care Note  Patient: Keith [name]  Procedure(s) Performed: IRRIGATION AND DEBRIDEMENT RIGHT FOOT AND APPLY SPLIT THICKNESS SKIN GRAFT AND WOUND VAC (Right )  Patient Location: PACU  Anesthesia Type:General  Level of Consciousness: drowsy and patient cooperative  Airway & Oxygen Therapy: Patient Spontanous Breathing and Patient connected to face mask oxygen  Post-op Assessment: Report given to RN and Post -op Vital signs reviewed and stable  Post vital signs: Reviewed and stable  Last Vitals:  Vitals:   03/20/17 2107 03/21/17 0550  BP: 139/69 108/68  Pulse: 93 88  Resp: 18 18  Temp: 36.6 C 36.9 C  SpO2: 99% 100%    Last Pain:  Vitals:   03/21/17 0550  TempSrc: Oral  PainSc:       Patients Stated Pain Goal: 3 (24/46/95 0722)  Complications: No apparent anesthesia complications

## 2017-03-21 NOTE — Anesthesia Preprocedure Evaluation (Addendum)
Anesthesia Evaluation  Patient identified by MRN, date of birth, ID band Patient awake    Reviewed: Allergy & Precautions, NPO status , Patient's Chart, lab work & pertinent test results  Airway Mallampati: II  TM Distance: >3 FB Neck ROM: Full    Dental  (+) Poor Dentition   Pulmonary neg pulmonary ROS,    Pulmonary exam normal breath sounds clear to auscultation       Cardiovascular hypertension, Pt. on medications + Peripheral Vascular Disease  Normal cardiovascular exam Rhythm:Regular Rate:Normal     Neuro/Psych  Neuromuscular disease negative psych ROS   GI/Hepatic negative GI ROS, Neg liver ROS,   Endo/Other  diabetes, Type 2, Insulin Dependent  Renal/GU CRFRenal disease  negative genitourinary   Musculoskeletal   Abdominal (+) + obese,   Peds  Hematology  (+) Blood dyscrasia, anemia ,   Anesthesia Other Findings HIV-AIDS  Reproductive/Obstetrics                             Anesthesia Physical  Anesthesia Plan  ASA: III  Anesthesia Plan: General   Post-op Pain Management:    Induction: Intravenous  PONV Risk Score and Plan: 1 and 3 and Ondansetron and Dexamethasone  Airway Management Planned: LMA  Additional Equipment: None  Intra-op Plan:   Post-operative Plan:   Informed Consent: I have reviewed the patients History and Physical, chart, labs and discussed the procedure including the risks, benefits and alternatives for the proposed anesthesia with the patient or authorized representative who has indicated his/her understanding and acceptance.   Dental advisory given  Plan Discussed with: CRNA and Surgeon  Anesthesia Plan Comments:         Anesthesia Quick Evaluation

## 2017-03-21 NOTE — Interval H&P Note (Signed)
History and Physical Interval Note:  03/21/2017 7:22 AM  Keith [name]  has presented today for surgery, with the diagnosis of Wound Right Foot  The various methods of treatment have been discussed with the patient and family. After consideration of risks, benefits and other options for treatment, the patient has consented to  Procedure(s): IRRIGATION AND DEBRIDEMENT RIGHT FOOT AND APPLY SPLIT THICKNESS SKIN GRAFT AND WOUND VAC (Right) as a surgical intervention .  The patient's history has been reviewed, patient examined, no change in status, stable for surgery.  I have reviewed the patient's chart and labs.  Questions were answered to the patient's satisfaction.     [name]

## 2017-03-21 NOTE — Progress Notes (Signed)
Patient had been NPO from midnight in readiness for the surgical procedure of left foot.  Informed consent signed by patient.   Patient dislodged his wound vac connection around 5am this morning at the base of the foot while sleeping. It was noted when the wound vac started to beep continuously.I disconnected the wound vac and placed wet to dry dressing on the wound because patient will be going for surgery of the foot and wound vac will be placed at the surgery.  Hibiclens bathe was performed.  NS continuous running at 32ml/hr.  Patient remained alert and oriented throughout the night no acute distress noted.

## 2017-03-21 NOTE — Progress Notes (Signed)
Held insulin d/t CBG of 98 and pt is NPO awaiting surgical procedure.

## 2017-03-21 NOTE — Care Management Note (Signed)
Case Management Note  Patient Details  Name: Keith [name] MRN: [id number] Date of Birth: 08-02-1965  Subjective/Objective:          Admitted with Sepsis/ Abscess from right foot cellulitis and possible abscess. Resides with brother Keith [name].    12/11  S/P I & D R foot  12/14   S/P IRRIGATION AND DEBRIDEMENT RIGHT FOOT AND APPLY SPLIT THICKNESS SKIN GRAFT AND WOUND VAC  Denis Carreon (Mother)     [phone]       PCP: Molli Barrows  Action/Plan: Arletha Pili wound VAC for 1 week postoperatively.   SNF vs home with home health  Expected Discharge Date:                  Expected Discharge Plan:    In-House Referral:   CSW  Discharge planning Services  CM Consult  Post Acute Care Choice:    Choice offered to:     DME Arranged:    Patient will need kneeling scooter or walker if discharges to home vs SNF.    DME Agency:     HH Arranged:    HH Agency:     Status of Service:  In process, will continue to follow  If discussed at Long Length of Stay Meetings, dates discussed:    Additional Comments:  Sharin Mons, RN 03/21/2017, 3:18 PM

## 2017-03-21 NOTE — Progress Notes (Signed)
PT Cancellation Note  Patient Details Name: ARIC JOST MRN: [id number] DOB: 10-30-1965   Cancelled Treatment:    Reason Eval/Treat Not Completed: Patient at procedure or test/unavailable. Pt in surgery this AM. RN advised pt would not be ready for therapy today. Will reschedule for tomorrow.  Benjiman Core, PTA Pager [phone] Acute Rehab   Allena Katz 03/21/2017, 10:46 AM

## 2017-03-21 NOTE — Anesthesia Procedure Notes (Signed)
Procedure Name: LMA Insertion Date/Time: 03/21/2017 10:16 AM Performed by: Freddie Breech, CRNA Pre-anesthesia Checklist: Patient identified, Emergency Drugs available, Suction available and Patient being monitored Patient Re-evaluated:Patient Re-evaluated prior to induction Oxygen Delivery Method: Circle System Utilized Preoxygenation: Pre-oxygenation with 100% oxygen Induction Type: IV induction Ventilation: Mask ventilation without difficulty LMA: LMA inserted LMA Size: 5.0 Number of attempts: 1 Airway Equipment and Method: Bite block Placement Confirmation: positive ETCO2 Tube secured with: Tape Dental Injury: Teeth and Oropharynx as per pre-operative assessment

## 2017-03-22 LAB — CBC WITH DIFFERENTIAL/PLATELET
Basophils Absolute: 0 10*3/uL (ref 0.0–0.1)
Basophils Relative: 0 %
Eosinophils Absolute: 0 10*3/uL (ref 0.0–0.7)
Eosinophils Relative: 0 %
HCT: 28.5 % — ABNORMAL LOW (ref 39.0–52.0)
Hemoglobin: 9.4 g/dL — ABNORMAL LOW (ref 13.0–17.0)
Lymphocytes Relative: 7 %
Lymphs Abs: 1.1 10*3/uL (ref 0.7–4.0)
MCH: 27.6 pg (ref 26.0–34.0)
MCHC: 33 g/dL (ref 30.0–36.0)
MCV: 83.8 fL (ref 78.0–100.0)
Monocytes Absolute: 0.4 10*3/uL (ref 0.1–1.0)
Monocytes Relative: 2 %
Neutro Abs: 13 10*3/uL — ABNORMAL HIGH (ref 1.7–7.7)
Neutrophils Relative %: 91 %
Platelets: 472 10*3/uL — ABNORMAL HIGH (ref 150–400)
RBC: 3.4 MIL/uL — ABNORMAL LOW (ref 4.22–5.81)
RDW: 12.9 % (ref 11.5–15.5)
WBC: 14.4 10*3/uL — ABNORMAL HIGH (ref 4.0–10.5)

## 2017-03-22 LAB — BASIC METABOLIC PANEL
Anion gap: 7 (ref 5–15)
BUN: 9 mg/dL (ref 6–20)
CO2: 27 mmol/L (ref 22–32)
Calcium: 8.6 mg/dL — ABNORMAL LOW (ref 8.9–10.3)
Chloride: 98 mmol/L — ABNORMAL LOW (ref 101–111)
Creatinine, Ser: 1.14 mg/dL (ref 0.61–1.24)
GFR calc Af Amer: >60 mL/min (ref >60)
GFR calc non Af Amer: >60 mL/min (ref >60)
Glucose, Bld: 268 mg/dL — ABNORMAL HIGH (ref 65–99)
Potassium: 4.2 mmol/L (ref 3.5–5.1)
Sodium: 132 mmol/L — ABNORMAL LOW (ref 135–145)

## 2017-03-22 LAB — GLUCOSE, CAPILLARY
Glucose-Capillary: 242 mg/dL — ABNORMAL HIGH (ref 65–99)
Glucose-Capillary: 225 mg/dL — ABNORMAL HIGH (ref 65–99)
Glucose-Capillary: 176 mg/dL — ABNORMAL HIGH (ref 65–99)
Glucose-Capillary: 192 mg/dL — ABNORMAL HIGH (ref 65–99)

## 2017-03-22 MED ORDER — SODIUM CHLORIDE 0.9 % IV SOLN: INTRAVENOUS | Status: DC

## 2017-03-22 NOTE — Progress Notes (Signed)
Physical Therapy Treatment Patient Details Name: Keith [name] MRN: [id number] DOB: 10/21/1965 Today's Date: 03/22/2017    History of Present Illness 51 y.o. male with history of HIV, diabetes mellitus type 2, hypertension presents to the ER with complaints of increasing pain and swelling in the right foot for the last few days. S/p incision and drainage by Dr. Louanne Skye 12/8 S/P IRRIGATION AND DEBRIDEMENT EXTREMITY excision skin and soft tissue muscle and bone with decompression of the abscess excision of the sesamoid bones, application of installation wound VAC 03/18/17.Planned foot salvage by [name] 03/21/17     PT Comments    Pt requiring max encouragement to participate in therapy. He reports he has been up already in the chair and returned to bed with out physical assist or AD. Reminded pt of WB restrictions and need for RW & supervision when ambulating. Pt refused ambulation or transfers but agreeable to ther ex in bed. Pt eager to perform exercises in bed and pushing himself to do more than instructed. Plan to progress ambulation next session. Pt would benefit from continued skilled PT to increase safety with mobility and functional independence. Will continue to follow acutely.   Follow Up Recommendations  SNF     Equipment Recommendations  Rolling walker with 5" wheels;3in1 (PT)    Recommendations for Other Services       Precautions / Restrictions Precautions Precautions: Other (comment) Precaution Comments: Wound vac Restrictions Weight Bearing Restrictions: Yes RLE Weight Bearing: Non weight bearing    Mobility  Bed Mobility Overal bed mobility: Modified Independent                Transfers                 General transfer comment: pt refused secondary to fatigue  Ambulation/Gait             General Gait Details: pt refused, but states he has been ambulating in room w/o AD or assist   Stairs            Wheelchair Mobility     Modified Rankin (Stroke Patients Only)       Balance                                            Cognition Arousal/Alertness: Awake/alert Behavior During Therapy: WFL for tasks assessed/performed Overall Cognitive Status: Within Functional Limits for tasks assessed                                        Exercises General Exercises - Lower Extremity Ankle Circles/Pumps: AROM;Both;10 reps;Supine Short Arc Quad: AROM;Right;20 reps;Supine Heel Slides: AROM;Right;20 reps;Supine Hip ABduction/ADduction: AROM;Right;20 reps;Supine Straight Leg Raises: AROM;Right;10 reps;Supine    General Comments        Pertinent Vitals/Pain Pain Assessment: Faces Faces Pain Scale: Hurts little more Pain Location: R foot Pain Descriptors / Indicators: Aching;Constant;Burning Pain Intervention(s): Monitored during session;Limited activity within patient's tolerance    Home Living                      Prior Function            PT Goals (current goals can now be found in the care plan section) Acute Rehab PT Goals Patient Stated Goal:  get better PT Goal Formulation: With patient Time For Goal Achievement: 04/02/17 Potential to Achieve Goals: Fair Progress towards PT goals: Progressing toward goals    Frequency    Min 3X/week      PT Plan Current plan remains appropriate    Co-evaluation              AM-PAC PT "6 Clicks" Daily Activity  Outcome Measure  Difficulty turning over in bed (including adjusting bedclothes, sheets and blankets)?: A Little Difficulty moving from lying on back to sitting on the side of the bed? : A Little Difficulty sitting down on and standing up from a chair with arms (e.g., wheelchair, bedside commode, etc,.)?: Unable Help needed moving to and from a bed to chair (including a wheelchair)?: A Little Help needed walking in hospital room?: A Lot Help needed climbing 3-5 steps with a railing? : Total 6  Click Score: 13    End of Session Equipment Utilized During Treatment: Gait belt Activity Tolerance: Patient tolerated treatment well Patient left: in chair;with call bell/phone within reach;with chair alarm set;with nursing/sitter in room Nurse Communication: Mobility status PT Visit Diagnosis: Unsteadiness on feet (R26.81);Other abnormalities of gait and mobility (R26.89);Muscle weakness (generalized) (M62.81);Difficulty in walking, not elsewhere classified (R26.2);Pain Pain - Right/Left: Right Pain - part of body: Ankle and joints of foot     Time: 8719-5974 PT Time Calculation (min) (ACUTE ONLY): 20 min  Charges:  $Therapeutic Exercise: 8-22 mins                    G Codes:       Benjiman Core, [place] Pager [phone] Acute Rehab   Allena Katz 03/22/2017, 3:51 PM

## 2017-03-22 NOTE — Progress Notes (Signed)
RN notified that PICC would not be placed today.

## 2017-03-22 NOTE — Progress Notes (Signed)
Patient is stating he dropped his lasagna on the floor, I saw that the plate and the majority of the lasagna was on the floor. I ordered him another lasagna only since the majority was dropped.

## 2017-03-22 NOTE — Progress Notes (Signed)
PROGRESS NOTE  Keith [name] DGL:[id number] DOB: 02/21/1966 DOA: 03/15/2017 PCP: Scot Jun, FNP  HPI/Recap of past 24 hours: Keith [name] a 51 y.o.malewithhistory of HIV, diabetes mellitus type 2, hypertension presents to the ER with complaints of increasing pain and swelling in the right foot for the last few days. Patient also noticed increasing discharge from the foot ulcer.Patient states he has been compliant with his anti-retroviral's and Lantus dose.On exam, he has a first metatarsal ulcer on plantar surface as well as skin laceration on lateral edge of foot. MRI right foot completed showedfirst MTP joint osteomyelitisas well as abscess. Orthopedic surgery was consulted as well as ID.  Today, pt reported no new complains except R foot pain. Pt denies any chest pain, SOB, abdominal pain, N/V/D/C, fever/chills.   Assessment/Plan: Principal Problem:   Sepsis ([place]) Active Problems:   Essential hypertension   Insulin-requiring or dependent type II diabetes mellitus (HCC)   HIV disease (HCC)   CKD (chronic kidney disease) stage 3, GFR 30-59 ml/min (HCC)   Abscess of right foot  #Sepsis secondary to osteomyelitis of right first MTP, DM wound  Resolving -Currently afebrile, with leukocytosis, likely reactive due to recent surgery -Ortho and ID consulted -ABI normal  -S/p incision and drainage by Dr. Louanne Skye 12/8 -S/p irrigation and debridement, excision with decompression of abscess of sesamoid bone,    wound vac placement by [name] 12/11 and 12/14 -Wound cx growing GBS, MSSA, Rare E. Fecalis sensitive to ampicillin  -BC NGTD -Started on IV Unasyn on 12/12, for a total of 4 weeks, end date 04/17/17 -S/p IV Zosyn, Dapto -Awaiting PICC line placement  #AKI  -Resolving -Baseline Cr 1 -Renal USS done on 09/2016: Normal size kidneys without hydronephrosis. Mild increased   cortical echogenicity which can be seen with medical renal disease.  -D/C IVF -Strict  I/Os -Daily BMP  #DM type 2, uncontrolled with hyperglycemia -Ha1c 10.1 -DM coordinator consult  -Lantus, Novolog, SSI  #HIV -Continue home meds   #HTN -Continue norvasc   #Hypokalemia -Replace prn     Code Status: Full  Family Communication: None at bedside  Disposition Plan: SNF placement   Consultants:  Ortho  ID  Procedures:  S/p incision and drainage by Dr. Louanne Skye 12/8   S/p irrigation and debridement, excision with decompression of abscess of sesamoid bone, wound vac placement by [name] 12/11 and 12/14   Antimicrobials:  IV Unasyn  S/P IV Zosyn, IV Daptomycin  DVT prophylaxis:  [place] Heparin   Objective: Vitals:   03/21/17 2117 03/22/17 0457 03/22/17 1055 03/22/17 1450  BP: 133/77 140/85 (!) 142/84 124/76  Pulse: 95 80  81  Resp: 18 19  20   Temp: (!) 97.5 F (36.4 C) 97.9 F (36.6 C)  97.7 F (36.5 C)  TempSrc: Oral Oral  Oral  SpO2: 100% 97%  100%  Weight:  112.5 kg (248 lb 0.3 oz)    Height:        Intake/Output Summary (Last 24 hours) at 03/22/2017 1615 Last data filed at 03/22/2017 1515 Gross per 24 hour  Intake 5009.17 ml  Output 2550 ml  Net 2459.17 ml   Filed Weights   03/20/17 0656 03/21/17 0934 03/22/17 0457  Weight: 115.8 kg (255 lb 4.7 oz) 115.7 kg (255 lb) 112.5 kg (248 lb 0.3 oz)    Exam:   General: Awake, alert, oriented x3  Cardiovascular: S1-S2 present, no added heart sounds  Respiratory: Chest clear to auscultation bilaterally  Abdomen: Soft,  nontender, nondistended, bowel sounds present  Musculoskeletal: No bilateral pedal edema  Skin: Right foot dressing intact, wound VAC placed  Psychiatry: Normal   Data Reviewed: CBC: Recent Labs  Lab 03/18/17 0223 03/19/17 0522 03/20/17 0530 03/21/17 0345 03/22/17 0444  WBC 12.6* 12.5* 11.0* 10.3 14.4*  NEUTROABS  --   --  7.8* 7.7 13.0*  HGB 9.6* 8.8* 8.4* 9.2* 9.4*  HCT 29.0* 26.8* 26.2* 28.7* 28.5*  MCV 83.8 84.3 85.1 84.2 83.8  PLT 331 313 329  388 354*   Basic Metabolic Panel: Recent Labs  Lab 03/18/17 0223 03/19/17 0522 03/20/17 0530 03/21/17 0345 03/22/17 0444  NA 136 134* 134* 138 132*  K 3.2* 3.2* 3.3* 3.1* 4.2  CL 98* 98* 99* 101 98*  CO2 28 25 27 27 27   GLUCOSE 104* 133* 194* 103* 268*  BUN 6 8 6  <5* 9  CREATININE 1.44* 1.51* 1.46* 1.25* 1.14  CALCIUM 8.6* 8.3* 8.2* 8.8* 8.6*   GFR: Estimated Creatinine Clearance: 93.7 mL/min (by C-G formula based on SCr of 1.14 mg/dL). Liver Function Tests: No results for input(s): AST, ALT, ALKPHOS, BILITOT, PROT, ALBUMIN in the last 168 hours. No results for input(s): LIPASE, AMYLASE in the last 168 hours. No results for input(s): AMMONIA in the last 168 hours. Coagulation Profile: No results for input(s): INR, PROTIME in the last 168 hours. Cardiac Enzymes: Recent Labs  Lab 03/16/17 1758  CKTOTAL 113   BNP (last 3 results) No results for input(s): PROBNP in the last 8760 hours. HbA1C: No results for input(s): HGBA1C in the last 72 hours. CBG: Recent Labs  Lab 03/21/17 1150 03/21/17 1713 03/21/17 2114 03/22/17 0805 03/22/17 1244  GLUCAP 114* 227* 277* 242* 225*   Lipid Profile: No results for input(s): CHOL, HDL, LDLCALC, TRIG, CHOLHDL, LDLDIRECT in the last 72 hours. Thyroid Function Tests: No results for input(s): TSH, T4TOTAL, FREET4, T3FREE, THYROIDAB in the last 72 hours. Anemia Panel: No results for input(s): VITAMINB12, FOLATE, FERRITIN, TIBC, IRON, RETICCTPCT in the last 72 hours. Urine analysis:    Component Value Date/Time   COLORURINE YELLOW 11/18/2016 1429   APPEARANCEUR CLOUDY (A) 11/18/2016 1429   LABSPEC >=1.030 01/01/2017 1032   PHURINE 5.5 01/01/2017 1032   GLUCOSEU 250 (A) 01/01/2017 1032   HGBUR SMALL (A) 01/01/2017 1032   BILIRUBINUR NEGATIVE 01/01/2017 1032   KETONESUR NEGATIVE 01/01/2017 1032   PROTEINUR 100 (A) 01/01/2017 1032   UROBILINOGEN 1.0 01/01/2017 1032   NITRITE NEGATIVE 01/01/2017 1032   LEUKOCYTESUR NEGATIVE  01/01/2017 1032   Sepsis Labs: @LABRCNTIP (procalcitonin:4,lacticidven:4)  ) Recent Results (from the past 240 hour(s))  Wound or Superficial Culture     Status: None   Collection Time: 03/15/17  3:28 AM  Result Value Ref Range Status   Specimen Description WOUND RIGHT FOOT  Final   Special Requests NONE  Final   Gram Stain   Final    ABUNDANT WBC PRESENT, PREDOMINANTLY PMN ABUNDANT GRAM NEGATIVE RODS ABUNDANT GRAM POSITIVE COCCI    Culture   Final    RARE STAPHYLOCOCCUS AUREUS RARE GROUP B STREP(S.AGALACTIAE)ISOLATED TESTING AGAINST S. AGALACTIAE NOT ROUTINELY PERFORMED DUE TO PREDICTABILITY OF AMP/PEN/VAN SUSCEPTIBILITY. WITHIN MIXED CULTURE    Report Status 03/18/2017 FINAL  Final   Organism ID, Bacteria STAPHYLOCOCCUS AUREUS  Final      Susceptibility   Staphylococcus aureus - MIC*    CIPROFLOXACIN <=0.5 SENSITIVE Sensitive     ERYTHROMYCIN >=8 RESISTANT Resistant     GENTAMICIN <=0.5 SENSITIVE Sensitive     OXACILLIN <=  0.25 SENSITIVE Sensitive     TETRACYCLINE <=1 SENSITIVE Sensitive     VANCOMYCIN <=0.5 SENSITIVE Sensitive     TRIMETH/SULFA <=10 SENSITIVE Sensitive     CLINDAMYCIN RESISTANT Resistant     RIFAMPIN <=0.5 SENSITIVE Sensitive     Inducible Clindamycin POSITIVE Resistant     * RARE STAPHYLOCOCCUS AUREUS  Blood culture (routine x 2)     Status: None   Collection Time: 03/15/17  3:55 AM  Result Value Ref Range Status   Specimen Description BLOOD RIGHT ANTECUBITAL  Final   Special Requests   Final    BOTTLES DRAWN AEROBIC AND ANAEROBIC Blood Culture adequate volume   Culture NO GROWTH 5 DAYS  Final   Report Status 03/20/2017 FINAL  Final  Blood culture (routine x 2)     Status: None   Collection Time: 03/15/17  4:00 AM  Result Value Ref Range Status   Specimen Description BLOOD LEFT ANTECUBITAL  Final   Special Requests   Final    BOTTLES DRAWN AEROBIC AND ANAEROBIC Blood Culture adequate volume   Culture NO GROWTH 5 DAYS  Final   Report Status  03/20/2017 FINAL  Final  MRSA PCR Screening     Status: None   Collection Time: 03/15/17  2:45 PM  Result Value Ref Range Status   MRSA by PCR NEGATIVE NEGATIVE Final    Comment:        The GeneXpert MRSA Assay (FDA approved for NASAL specimens only), is one component of a comprehensive MRSA colonization surveillance program. It is not intended to diagnose MRSA infection nor to guide or monitor treatment for MRSA infections.   Surgical pcr screen     Status: None   Collection Time: 03/18/17  1:21 AM  Result Value Ref Range Status   MRSA, PCR NEGATIVE NEGATIVE Final   Staphylococcus aureus NEGATIVE NEGATIVE Final    Comment: (NOTE) The Xpert SA Assay (FDA approved for NASAL specimens in patients 73 years of age and older), is one component of a comprehensive surveillance program. It is not intended to diagnose infection nor to guide or monitor treatment.   Aerobic/Anaerobic Culture (surgical/deep wound)     Status: None (Preliminary result)   Collection Time: 03/18/17 10:23 AM  Result Value Ref Range Status   Specimen Description TISSUE RIGHT FOOT  Final   Special Requests NONE  Final   Gram Stain   Final    FEW WBC PRESENT, PREDOMINANTLY PMN NO ORGANISMS SEEN    Culture   Final    RARE GROUP B STREP(S.AGALACTIAE)ISOLATED TESTING AGAINST S. AGALACTIAE NOT ROUTINELY PERFORMED DUE TO PREDICTABILITY OF AMP/PEN/VAN SUSCEPTIBILITY. RARE ENTEROCOCCUS FAECALIS NO ANAEROBES ISOLATED; CULTURE IN PROGRESS FOR 5 DAYS CRITICAL RESULT CALLED TO, READ BACK BY AND VERIFIED WITH: E. MCAULEY RN, AT [id number] 03/19/17 BY D. VANHOOK REGARDING CULTURE GROWTH    Report Status PENDING  Incomplete   Organism ID, Bacteria ENTEROCOCCUS FAECALIS  Final      Susceptibility   Enterococcus faecalis - MIC*    AMPICILLIN <=2 SENSITIVE Sensitive     VANCOMYCIN 1 SENSITIVE Sensitive     GENTAMICIN SYNERGY SENSITIVE Sensitive     * RARE ENTEROCOCCUS FAECALIS      Studies: No results  found.  Scheduled Meds: . abacavir-dolutegravir-lamiVUDine  1 tablet Oral Daily  . amLODipine  10 mg Oral Daily  . darunavir-cobicistat  1 tablet Oral Daily  . docusate sodium  100 mg Oral BID  . feeding supplement (GLUCERNA SHAKE)  237 mL Oral  TID BM  . feeding supplement (PRO-STAT SUGAR FREE 64)  30 mL Oral BID  . heparin injection (subcutaneous)  5,000 Units Subcutaneous Q8H  . insulin aspart  0-15 Units Subcutaneous TID WC  . insulin aspart  0-5 Units Subcutaneous QHS  . insulin aspart  5 Units Subcutaneous TID WC  . insulin glargine  30 Units Subcutaneous Daily    Continuous Infusions: . sodium chloride 100 mL/hr at 03/22/17 1059  . sodium chloride 10 mL/hr at 03/20/17 2255  . sodium chloride    . ampicillin-sulbactam (UNASYN) IV Stopped (03/22/17 1324)  . lactated ringers 10 mL/hr at 03/18/17 0926  . lactated ringers 10 mL/hr at 03/21/17 0945  . methocarbamol (ROBAXIN)  IV       LOS: 7 days     Alma Friendly, MD [facility]   If 7PM-7AM, please contact night-coverage [email or web address] Password [email or web address] 03/22/2017, 4:15 PM

## 2017-03-23 LAB — GLUCOSE, CAPILLARY
Glucose-Capillary: 256 mg/dL — ABNORMAL HIGH (ref 65–99)
Glucose-Capillary: 180 mg/dL — ABNORMAL HIGH (ref 65–99)
Glucose-Capillary: 201 mg/dL — ABNORMAL HIGH (ref 65–99)
Glucose-Capillary: 204 mg/dL — ABNORMAL HIGH (ref 65–99)

## 2017-03-23 LAB — BASIC METABOLIC PANEL
Anion gap: 10 (ref 5–15)
BUN: 13 mg/dL (ref 6–20)
CO2: 24 mmol/L (ref 22–32)
Calcium: 8.6 mg/dL — ABNORMAL LOW (ref 8.9–10.3)
Chloride: 100 mmol/L — ABNORMAL LOW (ref 101–111)
Creatinine, Ser: 1.53 mg/dL — ABNORMAL HIGH (ref 0.61–1.24)
GFR calc Af Amer: 59 mL/min — ABNORMAL LOW (ref >60)
GFR calc non Af Amer: 51 mL/min — ABNORMAL LOW (ref >60)
Glucose, Bld: 238 mg/dL — ABNORMAL HIGH (ref 65–99)
Potassium: 3.7 mmol/L (ref 3.5–5.1)
Sodium: 134 mmol/L — ABNORMAL LOW (ref 135–145)

## 2017-03-23 LAB — CBC WITH DIFFERENTIAL/PLATELET
Basophils Absolute: 0 10*3/uL (ref 0.0–0.1)
Basophils Relative: 0 %
Eosinophils Absolute: 0.1 10*3/uL (ref 0.0–0.7)
Eosinophils Relative: 1 %
HCT: 28.7 % — ABNORMAL LOW (ref 39.0–52.0)
Hemoglobin: 9.3 g/dL — ABNORMAL LOW (ref 13.0–17.0)
Lymphocytes Relative: 14 %
Lymphs Abs: 1.7 10*3/uL (ref 0.7–4.0)
MCH: 27.5 pg (ref 26.0–34.0)
MCHC: 32.4 g/dL (ref 30.0–36.0)
MCV: 84.9 fL (ref 78.0–100.0)
Monocytes Absolute: 0.6 10*3/uL (ref 0.1–1.0)
Monocytes Relative: 5 %
Neutro Abs: 10.3 10*3/uL — ABNORMAL HIGH (ref 1.7–7.7)
Neutrophils Relative %: 80 %
Platelets: 534 10*3/uL — ABNORMAL HIGH (ref 150–400)
RBC: 3.38 MIL/uL — ABNORMAL LOW (ref 4.22–5.81)
RDW: 13.2 % (ref 11.5–15.5)
WBC: 12.7 10*3/uL — ABNORMAL HIGH (ref 4.0–10.5)

## 2017-03-23 MED ORDER — INSULIN GLARGINE 100 UNIT/ML ~~LOC~~ SOLN
30.0000 [IU] | Freq: Every day | SUBCUTANEOUS | Status: DC
  Administered 2017-03-23: 30 [IU] via SUBCUTANEOUS
  Filled 2017-03-23: qty 0.3

## 2017-03-23 MED ORDER — SODIUM CHLORIDE 0.9 % IV SOLN
INTRAVENOUS | Status: DC
  Administered 2017-03-23 – 2017-03-24 (×2): via INTRAVENOUS

## 2017-03-23 MED ORDER — INSULIN ASPART 100 UNIT/ML ~~LOC~~ SOLN
7.0000 [IU] | Freq: Three times a day (TID) | SUBCUTANEOUS | Status: DC
  Administered 2017-03-23 (×2): 7 [IU] via SUBCUTANEOUS

## 2017-03-23 NOTE — Progress Notes (Signed)
Spoke to Nephrology, Dr Jonnie Finner who reviewed patients chart concerning PICC line. Nephrology ok for PICC line to be placed.

## 2017-03-23 NOTE — Progress Notes (Signed)
I attempted to educate patient on PICC lines and offered the education video. Patient did not want to learn about it, he refused education and requested to take a nap and for me to try again later. IV team nurse was notified and she said she will talk to him.

## 2017-03-23 NOTE — Progress Notes (Signed)
PROGRESS NOTE  Keith [name] GQQ:[id number] DOB: 1965-06-09 DOA: 03/15/2017 PCP: Scot Jun, FNP  HPI/Recap of past 24 hours: Keith [name] a 51 y.o.malewithhistory of HIV, diabetes mellitus type 2, hypertension presents to the ER with complaints of increasing pain and swelling in the right foot for the last few days. Patient also noticed increasing discharge from the foot ulcer.Patient states he has been compliant with his anti-retroviral's and Lantus dose.On exam, he has a first metatarsal ulcer on plantar surface as well as skin laceration on lateral edge of foot. MRI right foot completed showedfirst MTP joint osteomyelitisas well as abscess. Orthopedic surgery was consulted as well as ID.  Today, pt reported no new complains except R foot pain. Pt denies any chest pain, SOB, abdominal pain, N/V/D/C, fever/chills. Pt is hesistant about PICC line insertion, wants to discuss with family first.   Assessment/Plan: Principal Problem:   Sepsis ([place]) Active Problems:   Essential hypertension   Insulin-requiring or dependent type II diabetes mellitus ([place])   HIV disease ([place])   CKD (chronic kidney disease) stage 3, GFR 30-59 ml/min (HCC)   Abscess of right foot  #Sepsis secondary to osteomyelitis of right first MTP, DM wound  Resolving -Currently afebrile, with leukocytosis, likely reactive due to recent surgery -Ortho and ID consulted -ABI normal  -S/p incision and drainage by Dr. Louanne Skye 12/8 -S/p irrigation and debridement, excision with decompression of abscess of sesamoid bone,    wound vac placement by [name] 12/11 and 12/14 -Wound cx growing GBS, MSSA, Rare E. Fecalis sensitive to ampicillin  -BC NGTD -Started on IV Unasyn on 12/12, for a total of 4 weeks, end date 04/17/17 -S/p IV Zosyn, Dapto -Awaiting PICC line placement  #AKI on CKD stage 3  -Ongoing -Baseline Cr around 1, but fluctuates  -Renal USS done on 09/2016: Normal size kidneys without  hydronephrosis. Mild increased   cortical echogenicity which can be seen with medical renal disease.  -Gentle hydration IVF -Strict I/Os -Daily BMP  #DM type 2, uncontrolled with hyperglycemia -Ha1c 10.1 -DM coordinator consult  -Lantus, Novolog, SSI  #HIV -Continue home meds   #HTN -Continue norvasc   #Hypokalemia -Replace prn     Code Status: Full  Family Communication: None at bedside  Disposition Plan: SNF placement   Consultants:  Ortho  ID  Procedures:  S/p incision and drainage by Dr. Louanne Skye 12/8   S/p irrigation and debridement, excision with decompression of abscess of sesamoid bone, wound vac placement by [name] 12/11 and 12/14   Antimicrobials:  IV Unasyn  S/P IV Zosyn, IV Daptomycin  DVT prophylaxis:  [place] Heparin   Objective: Vitals:   03/22/17 2217 03/23/17 0142 03/23/17 0544 03/23/17 1014  BP: 123/83  123/79 131/79  Pulse: 85  76   Resp: 20  18   Temp: 97.9 F (36.6 C)  97.7 F (36.5 C)   TempSrc: Oral  Oral   SpO2: 97%  97%   Weight:  119 kg (262 lb 5.6 oz)    Height:        Intake/Output Summary (Last 24 hours) at 03/23/2017 1254 Last data filed at 03/23/2017 1243 Gross per 24 hour  Intake 2994.17 ml  Output 3330 ml  Net -335.83 ml   Filed Weights   03/21/17 0934 03/22/17 0457 03/23/17 0142  Weight: 115.7 kg (255 lb) 112.5 kg (248 lb 0.3 oz) 119 kg (262 lb 5.6 oz)    Exam:   General: Awake, alert, oriented x3  Cardiovascular: S1-S2 present, no added heart sounds  Respiratory: Chest clear to auscultation bilaterally  Abdomen: Soft, nontender, nondistended, bowel sounds present  Musculoskeletal: No bilateral pedal edema  Skin: Right foot dressing intact, wound VAC placed  Psychiatry: Normal   Data Reviewed: CBC: Recent Labs  Lab 03/19/17 0522 03/20/17 0530 03/21/17 0345 03/22/17 0444 03/23/17 0253  WBC 12.5* 11.0* 10.3 14.4* 12.7*  NEUTROABS  --  7.8* 7.7 13.0* 10.3*  HGB 8.8* 8.4* 9.2* 9.4*  9.3*  HCT 26.8* 26.2* 28.7* 28.5* 28.7*  MCV 84.3 85.1 84.2 83.8 84.9  PLT 313 329 388 472* 623*   Basic Metabolic Panel: Recent Labs  Lab 03/19/17 0522 03/20/17 0530 03/21/17 0345 03/22/17 0444 03/23/17 0253  NA 134* 134* 138 132* 134*  K 3.2* 3.3* 3.1* 4.2 3.7  CL 98* 99* 101 98* 100*  CO2 25 27 27 27 24   GLUCOSE 133* 194* 103* 268* 238*  BUN 8 6 <5* 9 13  CREATININE 1.51* 1.46* 1.25* 1.14 1.53*  CALCIUM 8.3* 8.2* 8.8* 8.6* 8.6*   GFR: Estimated Creatinine Clearance: 71.9 mL/min (A) (by C-G formula based on SCr of 1.53 mg/dL (H)). Liver Function Tests: No results for input(s): AST, ALT, ALKPHOS, BILITOT, PROT, ALBUMIN in the last 168 hours. No results for input(s): LIPASE, AMYLASE in the last 168 hours. No results for input(s): AMMONIA in the last 168 hours. Coagulation Profile: No results for input(s): INR, PROTIME in the last 168 hours. Cardiac Enzymes: Recent Labs  Lab 03/16/17 1758  CKTOTAL 113   BNP (last 3 results) No results for input(s): PROBNP in the last 8760 hours. HbA1C: No results for input(s): HGBA1C in the last 72 hours. CBG: Recent Labs  Lab 03/22/17 1244 03/22/17 1627 03/22/17 2219 03/23/17 0830 03/23/17 1224  GLUCAP 225* 176* 192* 256* 180*   Lipid Profile: No results for input(s): CHOL, HDL, LDLCALC, TRIG, CHOLHDL, LDLDIRECT in the last 72 hours. Thyroid Function Tests: No results for input(s): TSH, T4TOTAL, FREET4, T3FREE, THYROIDAB in the last 72 hours. Anemia Panel: No results for input(s): VITAMINB12, FOLATE, FERRITIN, TIBC, IRON, RETICCTPCT in the last 72 hours. Urine analysis:    Component Value Date/Time   COLORURINE YELLOW 11/18/2016 1429   APPEARANCEUR CLOUDY (A) 11/18/2016 1429   LABSPEC >=1.030 01/01/2017 1032   PHURINE 5.5 01/01/2017 1032   GLUCOSEU 250 (A) 01/01/2017 1032   HGBUR SMALL (A) 01/01/2017 1032   BILIRUBINUR NEGATIVE 01/01/2017 1032   KETONESUR NEGATIVE 01/01/2017 1032   PROTEINUR 100 (A) 01/01/2017 1032     UROBILINOGEN 1.0 01/01/2017 1032   NITRITE NEGATIVE 01/01/2017 1032   LEUKOCYTESUR NEGATIVE 01/01/2017 1032   Sepsis Labs: @LABRCNTIP (procalcitonin:4,lacticidven:4)  ) Recent Results (from the past 240 hour(s))  Wound or Superficial Culture     Status: None   Collection Time: 03/15/17  3:28 AM  Result Value Ref Range Status   Specimen Description WOUND RIGHT FOOT  Final   Special Requests NONE  Final   Gram Stain   Final    ABUNDANT WBC PRESENT, PREDOMINANTLY PMN ABUNDANT GRAM NEGATIVE RODS ABUNDANT GRAM POSITIVE COCCI    Culture   Final    RARE STAPHYLOCOCCUS AUREUS RARE GROUP B STREP(S.AGALACTIAE)ISOLATED TESTING AGAINST S. AGALACTIAE NOT ROUTINELY PERFORMED DUE TO PREDICTABILITY OF AMP/PEN/VAN SUSCEPTIBILITY. WITHIN MIXED CULTURE    Report Status 03/18/2017 FINAL  Final   Organism ID, Bacteria STAPHYLOCOCCUS AUREUS  Final      Susceptibility   Staphylococcus aureus - MIC*    CIPROFLOXACIN <=0.5 SENSITIVE Sensitive  ERYTHROMYCIN >=8 RESISTANT Resistant     GENTAMICIN <=0.5 SENSITIVE Sensitive     OXACILLIN <=0.25 SENSITIVE Sensitive     TETRACYCLINE <=1 SENSITIVE Sensitive     VANCOMYCIN <=0.5 SENSITIVE Sensitive     TRIMETH/SULFA <=10 SENSITIVE Sensitive     CLINDAMYCIN RESISTANT Resistant     RIFAMPIN <=0.5 SENSITIVE Sensitive     Inducible Clindamycin POSITIVE Resistant     * RARE STAPHYLOCOCCUS AUREUS  Blood culture (routine x 2)     Status: None   Collection Time: 03/15/17  3:55 AM  Result Value Ref Range Status   Specimen Description BLOOD RIGHT ANTECUBITAL  Final   Special Requests   Final    BOTTLES DRAWN AEROBIC AND ANAEROBIC Blood Culture adequate volume   Culture NO GROWTH 5 DAYS  Final   Report Status 03/20/2017 FINAL  Final  Blood culture (routine x 2)     Status: None   Collection Time: 03/15/17  4:00 AM  Result Value Ref Range Status   Specimen Description BLOOD LEFT ANTECUBITAL  Final   Special Requests   Final    BOTTLES DRAWN AEROBIC AND  ANAEROBIC Blood Culture adequate volume   Culture NO GROWTH 5 DAYS  Final   Report Status 03/20/2017 FINAL  Final  MRSA PCR Screening     Status: None   Collection Time: 03/15/17  2:45 PM  Result Value Ref Range Status   MRSA by PCR NEGATIVE NEGATIVE Final    Comment:        The GeneXpert MRSA Assay (FDA approved for NASAL specimens only), is one component of a comprehensive MRSA colonization surveillance program. It is not intended to diagnose MRSA infection nor to guide or monitor treatment for MRSA infections.   Surgical pcr screen     Status: None   Collection Time: 03/18/17  1:21 AM  Result Value Ref Range Status   MRSA, PCR NEGATIVE NEGATIVE Final   Staphylococcus aureus NEGATIVE NEGATIVE Final    Comment: (NOTE) The Xpert SA Assay (FDA approved for NASAL specimens in patients 26 years of age and older), is one component of a comprehensive surveillance program. It is not intended to diagnose infection nor to guide or monitor treatment.   Aerobic/Anaerobic Culture (surgical/deep wound)     Status: None (Preliminary result)   Collection Time: 03/18/17 10:23 AM  Result Value Ref Range Status   Specimen Description TISSUE RIGHT FOOT  Final   Special Requests NONE  Final   Gram Stain   Final    FEW WBC PRESENT, PREDOMINANTLY PMN NO ORGANISMS SEEN    Culture   Final    RARE GROUP B STREP(S.AGALACTIAE)ISOLATED TESTING AGAINST S. AGALACTIAE NOT ROUTINELY PERFORMED DUE TO PREDICTABILITY OF AMP/PEN/VAN SUSCEPTIBILITY. RARE ENTEROCOCCUS FAECALIS NO ANAEROBES ISOLATED; CULTURE IN PROGRESS FOR 5 DAYS CRITICAL RESULT CALLED TO, READ BACK BY AND VERIFIED WITH: E. MCAULEY RN, AT 1324 03/19/17 BY D. VANHOOK REGARDING CULTURE GROWTH    Report Status PENDING  Incomplete   Organism ID, Bacteria ENTEROCOCCUS FAECALIS  Final      Susceptibility   Enterococcus faecalis - MIC*    AMPICILLIN <=2 SENSITIVE Sensitive     VANCOMYCIN 1 SENSITIVE Sensitive     GENTAMICIN SYNERGY SENSITIVE  Sensitive     * RARE ENTEROCOCCUS FAECALIS      Studies: No results found.  Scheduled Meds: . abacavir-dolutegravir-lamiVUDine  1 tablet Oral Daily  . amLODipine  10 mg Oral Daily  . darunavir-cobicistat  1 tablet Oral Daily  .  docusate sodium  100 mg Oral BID  . feeding supplement (GLUCERNA SHAKE)  237 mL Oral TID BM  . feeding supplement (PRO-STAT SUGAR FREE 64)  30 mL Oral BID  . heparin injection (subcutaneous)  5,000 Units Subcutaneous Q8H  . insulin aspart  0-15 Units Subcutaneous TID WC  . insulin aspart  0-5 Units Subcutaneous QHS  . insulin aspart  7 Units Subcutaneous TID WC  . insulin glargine  30 Units Subcutaneous QHS    Continuous Infusions: . sodium chloride 10 mL/hr at 03/20/17 2255  . sodium chloride    . sodium chloride 75 mL/hr at 03/23/17 1011  . ampicillin-sulbactam (UNASYN) IV Stopped (03/23/17 0723)  . methocarbamol (ROBAXIN)  IV       LOS: 8 days     Alma Friendly, MD [facility]   If 7PM-7AM, please contact night-coverage [email or web address] Password [email or web address] 03/23/2017, 12:54 PM

## 2017-03-23 NOTE — Progress Notes (Signed)
Spoke with pt at length re PICC placement.  Pt states "this is a heavy load to decide on.  I have to consult with my family.  I don't make quick decisions on such a heavy load".  Requested to come back later to see if agreeable.  Mickel Baas RN notified and to show educational video to pt.

## 2017-03-23 NOTE — Progress Notes (Signed)
Spoke with Dr [name] re hx of CKD3 and pt states he sees a nephrologist at [facility] MD aware of need for nephrology to approve PICC placement.

## 2017-03-23 NOTE — Progress Notes (Signed)
Spoke with pt at bedside. Pt states he has thought about it and has talked to his preacher about the PICC but not enough to make a decision.  States he is supposed to speak with the preacher at 1530, but has decided to wait until tomorrow to decide on if he is agreeable to having the PICC placed. RN notified.

## 2017-03-24 ENCOUNTER — Encounter (HOSPITAL_COMMUNITY): Payer: Self-pay | Admitting: Orthopedic Surgery

## 2017-03-24 DIAGNOSIS — L97509 Non-pressure chronic ulcer of other part of unspecified foot with unspecified severity: Secondary | ICD-10-CM | POA: Diagnosis not present

## 2017-03-24 DIAGNOSIS — L8992 Pressure ulcer of unspecified site, stage 2: Secondary | ICD-10-CM | POA: Diagnosis not present

## 2017-03-24 DIAGNOSIS — E119 Type 2 diabetes mellitus without complications: Secondary | ICD-10-CM | POA: Diagnosis not present

## 2017-03-24 DIAGNOSIS — Z4781 Encounter for orthopedic aftercare following surgical amputation: Secondary | ICD-10-CM | POA: Diagnosis not present

## 2017-03-24 DIAGNOSIS — M86171 Other acute osteomyelitis, right ankle and foot: Secondary | ICD-10-CM

## 2017-03-24 DIAGNOSIS — S91309A Unspecified open wound, unspecified foot, initial encounter: Secondary | ICD-10-CM | POA: Diagnosis not present

## 2017-03-24 DIAGNOSIS — A4101 Sepsis due to Methicillin susceptible Staphylococcus aureus: Secondary | ICD-10-CM | POA: Diagnosis not present

## 2017-03-24 DIAGNOSIS — L0291 Cutaneous abscess, unspecified: Secondary | ICD-10-CM | POA: Diagnosis not present

## 2017-03-24 DIAGNOSIS — M8620 Subacute osteomyelitis, unspecified site: Secondary | ICD-10-CM | POA: Diagnosis not present

## 2017-03-24 DIAGNOSIS — E11621 Type 2 diabetes mellitus with foot ulcer: Secondary | ICD-10-CM | POA: Diagnosis not present

## 2017-03-24 DIAGNOSIS — M25571 Pain in right ankle and joints of right foot: Secondary | ICD-10-CM | POA: Diagnosis not present

## 2017-03-24 DIAGNOSIS — N179 Acute kidney failure, unspecified: Secondary | ICD-10-CM | POA: Diagnosis not present

## 2017-03-24 DIAGNOSIS — I10 Essential (primary) hypertension: Secondary | ICD-10-CM | POA: Diagnosis not present

## 2017-03-24 DIAGNOSIS — E1165 Type 2 diabetes mellitus with hyperglycemia: Secondary | ICD-10-CM | POA: Diagnosis not present

## 2017-03-24 DIAGNOSIS — Z794 Long term (current) use of insulin: Secondary | ICD-10-CM | POA: Diagnosis not present

## 2017-03-24 DIAGNOSIS — L02611 Cutaneous abscess of right foot: Secondary | ICD-10-CM | POA: Diagnosis not present

## 2017-03-24 DIAGNOSIS — A419 Sepsis, unspecified organism: Secondary | ICD-10-CM | POA: Diagnosis not present

## 2017-03-24 DIAGNOSIS — E876 Hypokalemia: Secondary | ICD-10-CM | POA: Diagnosis not present

## 2017-03-24 DIAGNOSIS — R2689 Other abnormalities of gait and mobility: Secondary | ICD-10-CM | POA: Diagnosis not present

## 2017-03-24 DIAGNOSIS — M86179 Other acute osteomyelitis, unspecified ankle and foot: Secondary | ICD-10-CM | POA: Diagnosis not present

## 2017-03-24 DIAGNOSIS — B20 Human immunodeficiency virus [HIV] disease: Secondary | ICD-10-CM | POA: Diagnosis not present

## 2017-03-24 DIAGNOSIS — R739 Hyperglycemia, unspecified: Secondary | ICD-10-CM | POA: Diagnosis not present

## 2017-03-24 DIAGNOSIS — Z23 Encounter for immunization: Secondary | ICD-10-CM | POA: Diagnosis not present

## 2017-03-24 DIAGNOSIS — M86271 Subacute osteomyelitis, right ankle and foot: Secondary | ICD-10-CM | POA: Diagnosis not present

## 2017-03-24 DIAGNOSIS — M868X8 Other osteomyelitis, other site: Secondary | ICD-10-CM | POA: Diagnosis not present

## 2017-03-24 DIAGNOSIS — L03115 Cellulitis of right lower limb: Secondary | ICD-10-CM | POA: Diagnosis not present

## 2017-03-24 DIAGNOSIS — N183 Chronic kidney disease, stage 3 (moderate): Secondary | ICD-10-CM | POA: Diagnosis not present

## 2017-03-24 LAB — BASIC METABOLIC PANEL
Anion gap: 8 (ref 5–15)
BUN: 15 mg/dL (ref 6–20)
CO2: 25 mmol/L (ref 22–32)
Calcium: 8.6 mg/dL — ABNORMAL LOW (ref 8.9–10.3)
Chloride: 101 mmol/L (ref 101–111)
Creatinine, Ser: 1.45 mg/dL — ABNORMAL HIGH (ref 0.61–1.24)
GFR calc Af Amer: >60 mL/min (ref >60)
GFR calc non Af Amer: 54 mL/min — ABNORMAL LOW (ref >60)
Glucose, Bld: 215 mg/dL — ABNORMAL HIGH (ref 65–99)
Potassium: 3.7 mmol/L (ref 3.5–5.1)
Sodium: 134 mmol/L — ABNORMAL LOW (ref 135–145)

## 2017-03-24 LAB — CBC WITH DIFFERENTIAL/PLATELET
Basophils Absolute: 0 10*3/uL (ref 0.0–0.1)
Basophils Relative: 0 %
Eosinophils Absolute: 0.2 10*3/uL (ref 0.0–0.7)
Eosinophils Relative: 2 %
HCT: 28.5 % — ABNORMAL LOW (ref 39.0–52.0)
Hemoglobin: 9.3 g/dL — ABNORMAL LOW (ref 13.0–17.0)
Lymphocytes Relative: 22 %
Lymphs Abs: 2.3 10*3/uL (ref 0.7–4.0)
MCH: 27.6 pg (ref 26.0–34.0)
MCHC: 32.6 g/dL (ref 30.0–36.0)
MCV: 84.6 fL (ref 78.0–100.0)
Monocytes Absolute: 0.5 10*3/uL (ref 0.1–1.0)
Monocytes Relative: 5 %
Neutro Abs: 7.9 10*3/uL — ABNORMAL HIGH (ref 1.7–7.7)
Neutrophils Relative %: 71 %
Platelets: 544 10*3/uL — ABNORMAL HIGH (ref 150–400)
RBC: 3.37 MIL/uL — ABNORMAL LOW (ref 4.22–5.81)
RDW: 13.2 % (ref 11.5–15.5)
WBC: 10.9 10*3/uL — ABNORMAL HIGH (ref 4.0–10.5)

## 2017-03-24 LAB — GLUCOSE, CAPILLARY
Glucose-Capillary: 141 mg/dL — ABNORMAL HIGH (ref 65–99)
Glucose-Capillary: 73 mg/dL (ref 65–99)
Glucose-Capillary: 166 mg/dL — ABNORMAL HIGH (ref 65–99)

## 2017-03-24 MED ORDER — OXYCODONE-ACETAMINOPHEN 5-325 MG PO TABS: 1.0000 | ORAL_TABLET | Freq: Four times a day (QID) | ORAL | 0 refills | Status: DC | PRN

## 2017-03-24 MED ORDER — SODIUM CHLORIDE 0.9% FLUSH: 10.0000 mL | INTRAVENOUS | Status: DC | PRN

## 2017-03-24 MED ORDER — LORAZEPAM 1 MG PO TABS: 1.0000 mg | ORAL_TABLET | Freq: Four times a day (QID) | ORAL | 0 refills | Status: DC | PRN

## 2017-03-24 MED ORDER — OXYCODONE-ACETAMINOPHEN 5-325 MG PO TABS
1.0000 | ORAL_TABLET | ORAL | Status: DC | PRN
  Administered 2017-03-24 (×2): 2 via ORAL
  Filled 2017-03-24 (×2): qty 2

## 2017-03-24 MED ORDER — INSULIN ASPART 100 UNIT/ML ~~LOC~~ SOLN
8.0000 [IU] | Freq: Three times a day (TID) | SUBCUTANEOUS | Status: DC
  Administered 2017-03-24 (×3): 8 [IU] via SUBCUTANEOUS

## 2017-03-24 MED ORDER — INSULIN GLARGINE 100 UNIT/ML ~~LOC~~ SOLN
35.0000 [IU] | Freq: Every day | SUBCUTANEOUS | Status: DC
  Filled 2017-03-24: qty 0.35

## 2017-03-24 MED ORDER — PREMIER PROTEIN SHAKE
11.0000 [oz_av] | Freq: Two times a day (BID) | ORAL | Status: DC
  Filled 2017-03-24 (×3): qty 325.31

## 2017-03-24 NOTE — Clinical Social Work Placement (Signed)
   CLINICAL SOCIAL WORK PLACEMENT  NOTE  Date:  03/24/2017  Patient Details  Name: Keith [name] MRN: [id number] Date of Birth: [birth year]  Clinical Social Work is seeking post-discharge placement for this patient at the [place] level of care (*CSW will initial, date and re-position this form in  chart as items are completed):  Yes   Patient/family provided with [facility] Work Department's list of facilities offering this level of care within the geographic area requested by the patient (or if unable, by the patient's family).  Yes   Patient/family informed of their freedom to choose among providers that offer the needed level of care, that participate in Medicare, Medicaid or managed care program needed by the patient, have an available bed and are willing to accept the patient.  Yes   Patient/family informed of [facility]'s ownership interest in [facility] and [facility], as well as of the fact that they are under no obligation to receive care at these facilities.  PASRR submitted to EDS on 03/20/17     PASRR number received on 03/20/17     Existing PASRR number confirmed on       FL2 transmitted to all facilities in geographic area requested by pt/family on 03/20/17     FL2 transmitted to all facilities within larger geographic area on       Patient informed that his/her managed care company has contracts with or will negotiate with certain facilities, including the following:        Yes   Patient/family informed of bed offers received.  Patient [facility] bed at [facility]     Physician recommends and patient chooses bed at      Patient to be transferred to [facility] on 03/24/17.  Patient to be transferred to facility by [facility]     Patient family notified on 03/24/17 of transfer.  Name of family member notified:  Patient alerting family     PHYSICIAN Please  prepare priority discharge summary, including medications     Additional Comment:    _______________________________________________ Benard Halsted, [place] 03/24/2017, 2:22 PM

## 2017-03-24 NOTE — Progress Notes (Signed)
PROGRESS NOTE  Keith [name] QIW:[id number] DOB: [birth year] DOA: 03/15/2017 PCP: Scot Jun, FNP  HPI/Recap of past 24 hours: Keith [name] a 51 y.o.malewithhistory of HIV, diabetes mellitus type 2, hypertension presents to the ER with complaints of increasing pain and swelling in the right foot for the last few days. Patient also noticed increasing discharge from the foot ulcer.Patient states he has been compliant with his anti-retroviral's and Lantus dose.On exam, he has a first metatarsal ulcer on plantar surface as well as skin laceration on lateral edge of foot. MRI right foot completed showedfirst MTP joint osteomyelitisas well as abscess. Orthopedic surgery was consulted as well as ID.  Today, pt reported no new complains except R foot pain. Pt denies any chest pain, SOB, abdominal pain, N/V/D/C, fever/chills.   Assessment/Plan: Principal Problem:   Sepsis ([place]) Active Problems:   Essential hypertension   Insulin-requiring or dependent type II diabetes mellitus (HCC)   HIV disease (HCC)   CKD (chronic kidney disease) stage 3, GFR 30-59 ml/min (HCC)   Abscess of right foot  #Sepsis secondary to osteomyelitis of right first MTP, DM wound  Resolving -Currently afebrile, with resolving leukocytosis -Ortho and ID consulted -ABI normal  -S/p incision and drainage by Dr. Louanne Skye 12/8 -S/p irrigation and debridement, excision with decompression of abscess of sesamoid bone,    wound vac placement by [name] 12/11 and 12/14 -Wound cx growing GBS, MSSA, Rare E. Fecalis sensitive to ampicillin  -BC NGTD -Started on IV Unasyn on 12/12, for a total of 4 weeks, end date 04/17/17 -S/p IV Zosyn, Dapto -Awaiting placement to SNF  #AKI on CKD stage 3  -Ongoing -Baseline Cr around 1, but fluctuates  -Renal USS done on 09/2016: Normal size kidneys without hydronephrosis. Mild increased   cortical echogenicity which can be seen with medical renal disease.  -Gentle  hydration IVF -Strict I/Os -Daily BMP  #DM type 2, uncontrolled with hyperglycemia -Ha1c 10.1 -DM coordinator consult  -Lantus, Novolog, SSI  #HIV -Continue home meds   #HTN -Continue norvasc   #Hypokalemia -Replace prn     Code Status: Full  Family Communication: None at bedside  Disposition Plan: SNF placement   Consultants:  Ortho  ID  Procedures:  S/p incision and drainage by Dr. Louanne Skye 12/8   S/p irrigation and debridement, excision with decompression of abscess of sesamoid bone, wound vac placement by [name] 12/11 and 12/14   Antimicrobials:  IV Unasyn  S/P IV Zosyn, IV Daptomycin  DVT prophylaxis:  [place] Heparin   Objective: Vitals:   03/23/17 1449 03/23/17 2126 03/24/17 0500 03/24/17 0522  BP: 119/66 135/77  112/69  Pulse: 82 82  77  Resp: 16 18  13   Temp: 98.4 F (36.9 C) 98.5 F (36.9 C)  (!) 97.4 F (36.3 C)  TempSrc: Oral Oral  Oral  SpO2: 95% 100%    Weight:   117.1 kg (258 lb 2.5 oz)   Height:        Intake/Output Summary (Last 24 hours) at 03/24/2017 1208 Last data filed at 03/24/2017 0920 Gross per 24 hour  Intake 762.08 ml  Output 3345 ml  Net -2582.92 ml   Filed Weights   03/22/17 0457 03/23/17 0142 03/24/17 0500  Weight: 112.5 kg (248 lb 0.3 oz) 119 kg (262 lb 5.6 oz) 117.1 kg (258 lb 2.5 oz)    Exam:   General: Awake, alert, oriented x3  Cardiovascular: S1-S2 present, no added heart sounds  Respiratory: Chest clear to  auscultation bilaterally  Abdomen: Soft, nontender, nondistended, bowel sounds present  Musculoskeletal: No bilateral pedal edema  Skin: Right foot dressing intact, wound VAC placed  Psychiatry: Normal   Data Reviewed: CBC: Recent Labs  Lab 03/20/17 0530 03/21/17 0345 03/22/17 0444 03/23/17 0253 03/24/17 0218  WBC 11.0* 10.3 14.4* 12.7* 10.9*  NEUTROABS 7.8* 7.7 13.0* 10.3* 7.9*  HGB 8.4* 9.2* 9.4* 9.3* 9.3*  HCT 26.2* 28.7* 28.5* 28.7* 28.5*  MCV 85.1 84.2 83.8 84.9 84.6    PLT 329 388 472* 534* 858*   Basic Metabolic Panel: Recent Labs  Lab 03/20/17 0530 03/21/17 0345 03/22/17 0444 03/23/17 0253 03/24/17 0218  NA 134* 138 132* 134* 134*  K 3.3* 3.1* 4.2 3.7 3.7  CL 99* 101 98* 100* 101  CO2 27 27 27 24 25   GLUCOSE 194* 103* 268* 238* 215*  BUN 6 <5* 9 13 15   CREATININE 1.46* 1.25* 1.14 1.53* 1.45*  CALCIUM 8.2* 8.8* 8.6* 8.6* 8.6*   GFR: Estimated Creatinine Clearance: 75.3 mL/min (A) (by C-G formula based on SCr of 1.45 mg/dL (H)). Liver Function Tests: No results for input(s): AST, ALT, ALKPHOS, BILITOT, PROT, ALBUMIN in the last 168 hours. No results for input(s): LIPASE, AMYLASE in the last 168 hours. No results for input(s): AMMONIA in the last 168 hours. Coagulation Profile: No results for input(s): INR, PROTIME in the last 168 hours. Cardiac Enzymes: No results for input(s): CKTOTAL, CKMB, CKMBINDEX, TROPONINI in the last 168 hours. BNP (last 3 results) No results for input(s): PROBNP in the last 8760 hours. HbA1C: No results for input(s): HGBA1C in the last 72 hours. CBG: Recent Labs  Lab 03/23/17 1224 03/23/17 1641 03/23/17 2124 03/24/17 0804 03/24/17 1153  GLUCAP 180* 201* 204* 141* 73   Lipid Profile: No results for input(s): CHOL, HDL, LDLCALC, TRIG, CHOLHDL, LDLDIRECT in the last 72 hours. Thyroid Function Tests: No results for input(s): TSH, T4TOTAL, FREET4, T3FREE, THYROIDAB in the last 72 hours. Anemia Panel: No results for input(s): VITAMINB12, FOLATE, FERRITIN, TIBC, IRON, RETICCTPCT in the last 72 hours. Urine analysis:    Component Value Date/Time   COLORURINE YELLOW 11/18/2016 1429   APPEARANCEUR CLOUDY (A) 11/18/2016 1429   LABSPEC >=1.030 01/01/2017 1032   PHURINE 5.5 01/01/2017 1032   GLUCOSEU 250 (A) 01/01/2017 1032   HGBUR SMALL (A) 01/01/2017 1032   BILIRUBINUR NEGATIVE 01/01/2017 1032   KETONESUR NEGATIVE 01/01/2017 1032   PROTEINUR 100 (A) 01/01/2017 1032   UROBILINOGEN 1.0 01/01/2017 1032    NITRITE NEGATIVE 01/01/2017 1032   LEUKOCYTESUR NEGATIVE 01/01/2017 1032   Sepsis Labs: @LABRCNTIP (procalcitonin:4,lacticidven:4)  ) Recent Results (from the past 240 hour(s))  Wound or Superficial Culture     Status: None   Collection Time: 03/15/17  3:28 AM  Result Value Ref Range Status   Specimen Description WOUND RIGHT FOOT  Final   Special Requests NONE  Final   Gram Stain   Final    ABUNDANT WBC PRESENT, PREDOMINANTLY PMN ABUNDANT GRAM NEGATIVE RODS ABUNDANT GRAM POSITIVE COCCI    Culture   Final    RARE STAPHYLOCOCCUS AUREUS RARE GROUP B STREP(S.AGALACTIAE)ISOLATED TESTING AGAINST S. AGALACTIAE NOT ROUTINELY PERFORMED DUE TO PREDICTABILITY OF AMP/PEN/VAN SUSCEPTIBILITY. WITHIN MIXED CULTURE    Report Status 03/18/2017 FINAL  Final   Organism ID, Bacteria STAPHYLOCOCCUS AUREUS  Final      Susceptibility   Staphylococcus aureus - MIC*    CIPROFLOXACIN <=0.5 SENSITIVE Sensitive     ERYTHROMYCIN >=8 RESISTANT Resistant     GENTAMICIN <=0.5 SENSITIVE  Sensitive     OXACILLIN <=0.25 SENSITIVE Sensitive     TETRACYCLINE <=1 SENSITIVE Sensitive     VANCOMYCIN <=0.5 SENSITIVE Sensitive     TRIMETH/SULFA <=10 SENSITIVE Sensitive     CLINDAMYCIN RESISTANT Resistant     RIFAMPIN <=0.5 SENSITIVE Sensitive     Inducible Clindamycin POSITIVE Resistant     * RARE STAPHYLOCOCCUS AUREUS  Blood culture (routine x 2)     Status: None   Collection Time: 03/15/17  3:55 AM  Result Value Ref Range Status   Specimen Description BLOOD RIGHT ANTECUBITAL  Final   Special Requests   Final    BOTTLES DRAWN AEROBIC AND ANAEROBIC Blood Culture adequate volume   Culture NO GROWTH 5 DAYS  Final   Report Status 03/20/2017 FINAL  Final  Blood culture (routine x 2)     Status: None   Collection Time: 03/15/17  4:00 AM  Result Value Ref Range Status   Specimen Description BLOOD LEFT ANTECUBITAL  Final   Special Requests   Final    BOTTLES DRAWN AEROBIC AND ANAEROBIC Blood Culture adequate volume    Culture NO GROWTH 5 DAYS  Final   Report Status 03/20/2017 FINAL  Final  MRSA PCR Screening     Status: None   Collection Time: 03/15/17  2:45 PM  Result Value Ref Range Status   MRSA by PCR NEGATIVE NEGATIVE Final    Comment:        The GeneXpert MRSA Assay (FDA approved for NASAL specimens only), is one component of a comprehensive MRSA colonization surveillance program. It is not intended to diagnose MRSA infection nor to guide or monitor treatment for MRSA infections.   Surgical pcr screen     Status: None   Collection Time: 03/18/17  1:21 AM  Result Value Ref Range Status   MRSA, PCR NEGATIVE NEGATIVE Final   Staphylococcus aureus NEGATIVE NEGATIVE Final    Comment: (NOTE) The Xpert SA Assay (FDA approved for NASAL specimens in patients 44 years of age and older), is one component of a comprehensive surveillance program. It is not intended to diagnose infection nor to guide or monitor treatment.   Aerobic/Anaerobic Culture (surgical/deep wound)     Status: None (Preliminary result)   Collection Time: 03/18/17 10:23 AM  Result Value Ref Range Status   Specimen Description TISSUE RIGHT FOOT  Final   Special Requests NONE  Final   Gram Stain   Final    FEW WBC PRESENT, PREDOMINANTLY PMN NO ORGANISMS SEEN    Culture   Final    RARE GROUP B STREP(S.AGALACTIAE)ISOLATED TESTING AGAINST S. AGALACTIAE NOT ROUTINELY PERFORMED DUE TO PREDICTABILITY OF AMP/PEN/VAN SUSCEPTIBILITY. RARE ENTEROCOCCUS FAECALIS HOLDING FOR POSSIBLE ANAEROBE CRITICAL RESULT CALLED TO, READ BACK BY AND VERIFIED WITH: E. MCAULEY RN, AT [phone] 03/19/17 BY D. VANHOOK REGARDING CULTURE GROWTH    Report Status PENDING  Incomplete   Organism ID, Bacteria ENTEROCOCCUS FAECALIS  Final      Susceptibility   Enterococcus faecalis - MIC*    AMPICILLIN <=2 SENSITIVE Sensitive     VANCOMYCIN 1 SENSITIVE Sensitive     GENTAMICIN SYNERGY SENSITIVE Sensitive     * RARE ENTEROCOCCUS FAECALIS       Studies: No results found.  Scheduled Meds: . abacavir-dolutegravir-lamiVUDine  1 tablet Oral Daily  . amLODipine  10 mg Oral Daily  . darunavir-cobicistat  1 tablet Oral Daily  . docusate sodium  100 mg Oral BID  . feeding supplement (GLUCERNA SHAKE)  237 mL  Oral TID BM  . feeding supplement (PRO-STAT SUGAR FREE 64)  30 mL Oral BID  . heparin injection (subcutaneous)  5,000 Units Subcutaneous Q8H  . insulin aspart  0-15 Units Subcutaneous TID WC  . insulin aspart  0-5 Units Subcutaneous QHS  . insulin aspart  8 Units Subcutaneous TID WC  . insulin glargine  35 Units Subcutaneous QHS    Continuous Infusions: . sodium chloride 75 mL/hr at 03/24/17 0436  . ampicillin-sulbactam (UNASYN) IV 3 g (03/24/17 0616)  . methocarbamol (ROBAXIN)  IV       LOS: 9 days     Alma Friendly, MD [facility]   If 7PM-7AM, please contact night-coverage [email or web address] Password [email or web address] 03/24/2017, 12:08 PM

## 2017-03-24 NOTE — Progress Notes (Signed)
Patient will DC to: [facility] Anticipated DC date: 03/24/17 Family notified: Pt notifying family Transport by: Corey Harold 3:30pm   Per MD patient ready for DC to [facility]. RN, patient, patient's family, and facility notified of DC. Discharge Summary sent to facility. RN given number for report [phone] Room 106). DC packet on chart. Ambulance transport requested for patient.   CSW signing off.  Cedric Fishman, [place] Social Worker [phone]

## 2017-03-24 NOTE — Progress Notes (Signed)
[name], male   DOB: 1965-07-18, 51 y.o.   MRN: [id number] Wound VAC dressing is in place patient reports that he pulled off the suction sponge last night.  The wound VAC is functioning well there is no drainage.  This will need to remain in place for about a week postoperatively and then dry dressing changes after that time.  Anticipate discharge to skilled nursing for patient to be nonweightbearing right foot

## 2017-03-24 NOTE — Progress Notes (Signed)
Patient discharged to [facility] via [facility], report given to [facility]. IV removed. Dressing on right foot- clean dry and intact.

## 2017-03-24 NOTE — Discharge Summary (Addendum)
Discharge Summary  Keith [name] Xxxgant IRJ:[id number] DOB: [birth year]  PCP: Scot Jun, FNP  Admit date: 03/15/2017 Discharge date: 03/24/2017  Time spent: >70mns  Recommendations for Outpatient Follow-up:  1. Orthopedics 2. Infectious disease 3. PCP  Discharge Diagnoses:  Active Hospital Problems   Diagnosis Date Noted  . [place]epsis ([place] 03/15/2017  . Abscess of right foot   . CKD (chronic kidney disease) stage 3, GFR 30-59 ml/min (HCC) 03/15/2017  . HIV disease ([place]   . Insulin-requiring or dependent type II diabetes mellitus ([place] 02/04/2014  . Essential hypertension 05/19/2008    Resolved Hospital Problems  No resolved problems to display.    Discharge Condition: Stable  Diet recommendation: Heart healthy/mod carb  Vitals:   03/24/17 0522 03/24/17 1313  BP: 112/69 99/72  Pulse: 77 82  Resp: 13 16  Temp: (!) 97.4 F (36.3 C) 97.7 F (36.5 C)  SpO2:  97%    History of present illness:  Keith [name] a 51y.o.malewithhistory of HIV, diabetes mellitus type 2, hypertension presents to the ER with complaints of increasing pain and swelling in the right foot for the last few days. Patient also noticed increasing discharge from the foot ulcer.Patient states he has been compliant with his anti-retroviral's and Lantus dose.On exam, he has a first metatarsal ulcer on plantar surface as well as skin laceration on lateral edge of foot. MRI right foot completed showedfirst MTP joint osteomyelitisas well as abscess. Orthopedic surgery was consulted as well as ID.  Today, pt reported no new complains except R foot pain. Pt denies any chest pain, SOB, abdominal pain, N/V/D/C, fever/chills. [facility]  Principal Problem:   Sepsis ([facility] Active Problems:   Essential hypertension   Insulin-requiring or dependent type II diabetes mellitus (HCC)   HIV disease (HCC)   CKD (chronic kidney disease) stage 3, GFR 30-59  ml/min (HCC)   Abscess of right foot  #Sepsis secondary to osteomyelitis of right first MTP, DM wound  Resolving -Currently afebrile, with resolving leukocytosis -Ortho and ID consulted -ABInormal -S/p incision and drainage by Dr. NLouanne Skye12/8 -S/p irrigation and debridement, excision with decompression of abscess of sesamoid bone,    wound vac placement by Dr. DSharol Given12/11 and 12/14 -Wound cx growing GBS, MSSA, Rare E. Fecalis sensitive to ampicillin -BC NGTD -Started on IV Unasyn on 12/12, for a total of 4 weeks, end date 04/17/17. PICC line placed -Pain management with percocet 1-2 tabs, Q6H prn -Non-weight bearing on RLE -Wound vac to be discontinued in 1 week -Follow up with Orthopedics and Infectious disease as an outpt  #AKI on CKD stage 3  -Ongoing -Baseline Cr around 1, but fluctuates  -Renal USS done on 09/2016: Normal size kidneys without hydronephrosis. Mild increased   cortical echogenicity which can be seen with medical renal disease  #DM type 2, uncontrolled with hyperglycemia -Ha1c 10.1 -DM coordinator consult  -Lantus, Novolog, SSI  #HIV -Continue home meds   #HTN -Continue norvasc  #Hypokalemia -Replace prn   Procedures:  S/p incision and drainage by Dr. NLouanne Skye12/8  S/p irrigation and debridement, excision with decompression of abscess of sesamoid bone, wound vac placement by Dr. DSharol Given12/11 and 12/14  Consultations:  Orthopedics  Infectious disease  Discharge Exam: BP 99/72 (BP Location: Left Arm)   Pulse 82   Temp 97.7 F (36.5 C) (Oral)   Resp 16   Ht _0  (1.753 m)   Wt 117.1 kg (258  lb 2.5 oz)   SpO2 97%   BMI 38.12 kg/m   General: Awake, alert, oriented x3 Cardiovascular: S1-S2 present, no added heart sounds Respiratory: Chest clear to auscultation bilaterally Musculoskeletal: Right foot dressing intact, wound vac in place, No bilateral pedal edema present  Discharge Instructions You were cared for by a hospitalist  during your hospital stay. If you have any questions about your discharge medications or the care you received while you were in the hospital after you are discharged, you can call the unit and asked to speak with the hospitalist on call if the hospitalist that took care of you is not available. Once you are discharged, your primary care physician will handle any further medical issues. Please note that NO REFILLS for any discharge medications will be authorized once you are discharged, as it is imperative that you return to your primary care physician (or establish a relationship with a primary care physician if you do not have one) for your aftercare needs so that they can reassess your need for medications and monitor your lab values.  Discharge Instructions    Diet - low sodium heart healthy   Complete by:  As directed    Increase activity slowly   Complete by:  As directed      Allergies as of 03/24/2017      Reactions   Sulfa Antibiotics Itching      Medication List    TAKE these medications   abacavir-dolutegravir-lamiVUDine 600-50-300 MG tablet Commonly known as:  TRIUMEQ Take 1 tablet by mouth daily.   amLODipine 10 MG tablet Commonly known as:  NORVASC Take 1 tablet (10 mg total) by mouth daily.   cyclobenzaprine 10 MG tablet Commonly known as:  FLEXERIL Take 1 tablet (10 mg total) by mouth 2 (two) times daily as needed for muscle spasms.   darunavir-cobicistat 800-150 MG tablet Commonly known as:  PREZCOBIX Take 1 tablet by mouth daily. Swallow whole. Do NOT crush, break or chew tablets. Take with food.   glimepiride 1 MG tablet Commonly known as:  AMARYL Take 1 tablet (1 mg total) by mouth every morning.   Insulin Glargine 100 UNIT/ML Solostar Pen Commonly known as:  LANTUS SOLOSTAR Inject 20 Units into the skin daily at 10 pm.   Insulin Syringes (Disposable) U-100 0.3 ML Misc For administration of insulin TID. ICD E-11.9   LORazepam 1 MG tablet Commonly known  as:  ATIVAN Take 1 tablet (1 mg total) by mouth every 6 (six) hours as needed for anxiety.   NARCAN 4 MG/0.1ML Liqd nasal spray kit Generic drug:  naloxone Place 1 spray into the nose daily as needed (opioid overdose).   Oxycodone HCl 10 MG Tabs Take 1 tablet (10 mg total) by mouth every 8 (eight) hours as needed (moderate or severe pain).      Allergies  Allergen Reactions  . Sulfa Antibiotics Itching    Contact information for follow-up providers    [name], MD Follow up in 1 week(s).   Specialty:  Orthopedic Surgery Contact information: [place] [address] [phone]        [name], MD. Schedule an appointment as soon as possible for a visit in 3 week(s).   Specialty:  Infectious Diseases Contact information: [address] [address] [phone]            Contact information for after-discharge care    Destination    [facility] .   Service:  Skilled Nursing Contact information: [address] [place] [phone]                   The results of significant diagnostics from this hospitalization (including imaging, microbiology, ancillary and laboratory) are listed below for reference.    Significant Diagnostic Studies: Mr Foot Right Wo Contrast  Result Date: 03/15/2017 CLINICAL DATA:  Right foot pain and swelling over the past few days in a diabetic patient. History of a skin ulceration on the foot for over a month. EXAM: MRI OF THE RIGHT FOREFOOT WITHOUT CONTRAST TECHNIQUE: Multiplanar, multisequence MR imaging of the right forefoot was performed. No intravenous contrast was administered. COMPARISON:  Plain films right foot 01/07/2017 scratch the MRI right foot 01/07/2017. Plain films right foot 01/06/2017 and earlier today. FINDINGS: Bones/Joint/Cartilage Marrow edema in the sesamoid bones of the first MTP joint is new  since the prior MRI and much more intense in the lateral sesamoid. Degenerative subchondral cyst at the third tarsometatarsal joint in the base the metatarsal is noted. Bone marrow signal is otherwise unremarkable. Ligaments Intact. Muscles and Tendons Edema throughout all intrinsic musculature of the foot is unchanged. Soft tissues Intense subcutaneous edema is present over the dorsum of the foot. An air and fluid collection centered just proximal to the lateral sesamoid measures approximately 1.2 cm transverse by 1 cm craniocaudal by 2.8 cm long and worrisome for abscess. This is new since the prior examination. Skin ulceration at the level of the head of the first metatarsal is again seen. There appears to be blistering of the skin more proximally and laterally to the skin ulceration. IMPRESSION: New marrow edema in the medial and lateral sesamoids of the first MTP joint is more intense in the lateral sesamoid bone and consistent with osteomyelitis. Air and fluid collection in the plantar soft tissues centered just proximal to the lateral sesamoid of the first MTP joint is compatible with an abscess. Skin ulceration at the level of the head of the first metatarsal is identified. There appears to be a blister on the skin surface more proximally and laterally. Electronically Signed   By: Inge Rise M.D.   On: 03/15/2017 14:04   Dg Chest Port 1 View  Result Date: 03/15/2017 CLINICAL DATA:  Cough for 2 weeks EXAM: PORTABLE CHEST 1 VIEW COMPARISON:  03/29/2016 FINDINGS: Cardiac shadow is within normal limits. The lungs are well aerated bilaterally. Mild right basilar atelectasis is noted. No acute bony abnormality is seen. IMPRESSION: Mild right basilar atelectasis. Electronically Signed   By: Inez Catalina M.D.   On: 03/15/2017 20:59   Dg Foot Complete Right  Result Date: 03/14/2017 CLINICAL DATA:  51 year old male with open wound on the lateral side of the right foot at the base of the fifth metatarsal.  History of diabetes. EXAM: RIGHT FOOT COMPLETE - 3+ VIEW COMPARISON:  Right foot MRI dated 01/07/2017 FINDINGS: There is no acute fracture or dislocation. The bones are well mineralized. Mild arthritic changes. No periosteal reaction or bone erosion. There is skin ulcer with soft tissue air in the plantar aspect of the forefoot at the level of the MTP joints. A second skin ulcer noted along the lateral aspect of the foot at the base of the fifth metatarsal. No soft tissue air noted adjacent to the this ulcer. There is diffuse soft tissue swelling of the foot. IMPRESSION: 1. No acute fracture or dislocation. No evidence of osteomyelitis  by radiograph. MRI or a white blood cell nuclear scan may provide better evaluation if there is high clinical concern for acute osteomyelitis. 2. Diffuse soft tissue swelling of the foot with ulcers and soft tissue air as described most consistent with cellulitis. Electronically Signed   By: Anner Crete M.D.   On: 03/14/2017 22:08    Microbiology: Recent Results (from the past 240 hour(s))  Wound or Superficial Culture     Status: None   Collection Time: 03/15/17  3:28 AM  Result Value Ref Range Status   Specimen Description WOUND RIGHT FOOT  Final   Special Requests NONE  Final   Gram Stain   Final    ABUNDANT WBC PRESENT, PREDOMINANTLY PMN ABUNDANT GRAM NEGATIVE RODS ABUNDANT GRAM POSITIVE COCCI    Culture   Final    RARE STAPHYLOCOCCUS AUREUS RARE GROUP B STREP(S.AGALACTIAE)ISOLATED TESTING AGAINST S. AGALACTIAE NOT ROUTINELY PERFORMED DUE TO PREDICTABILITY OF AMP/PEN/VAN SUSCEPTIBILITY. WITHIN MIXED CULTURE    Report Status 03/18/2017 FINAL  Final   Organism ID, Bacteria STAPHYLOCOCCUS AUREUS  Final      Susceptibility   Staphylococcus aureus - MIC*    CIPROFLOXACIN <=0.5 SENSITIVE Sensitive     ERYTHROMYCIN >=8 RESISTANT Resistant     GENTAMICIN <=0.5 SENSITIVE Sensitive     OXACILLIN <=0.25 SENSITIVE Sensitive     TETRACYCLINE <=1 SENSITIVE  Sensitive     VANCOMYCIN <=0.5 SENSITIVE Sensitive     TRIMETH/SULFA <=10 SENSITIVE Sensitive     CLINDAMYCIN RESISTANT Resistant     RIFAMPIN <=0.5 SENSITIVE Sensitive     Inducible Clindamycin POSITIVE Resistant     * RARE STAPHYLOCOCCUS AUREUS  Blood culture (routine x 2)     Status: None   Collection Time: 03/15/17  3:55 AM  Result Value Ref Range Status   Specimen Description BLOOD RIGHT ANTECUBITAL  Final   Special Requests   Final    BOTTLES DRAWN AEROBIC AND ANAEROBIC Blood Culture adequate volume   Culture NO GROWTH 5 DAYS  Final   Report Status 03/20/2017 FINAL  Final  Blood culture (routine x 2)     Status: None   Collection Time: 03/15/17  4:00 AM  Result Value Ref Range Status   Specimen Description BLOOD LEFT ANTECUBITAL  Final   Special Requests   Final    BOTTLES DRAWN AEROBIC AND ANAEROBIC Blood Culture adequate volume   Culture NO GROWTH 5 DAYS  Final   Report Status 03/20/2017 FINAL  Final  MRSA PCR Screening     Status: None   Collection Time: 03/15/17  2:45 PM  Result Value Ref Range Status   MRSA by PCR NEGATIVE NEGATIVE Final    Comment:        The GeneXpert MRSA Assay (FDA approved for NASAL specimens only), is one component of a comprehensive MRSA colonization surveillance program. It is not intended to diagnose MRSA infection nor to guide or monitor treatment for MRSA infections.   Surgical pcr screen     Status: None   Collection Time: 03/18/17  1:21 AM  Result Value Ref Range Status   MRSA, PCR NEGATIVE NEGATIVE Final   Staphylococcus aureus NEGATIVE NEGATIVE Final    Comment: (NOTE) The Xpert SA Assay (FDA approved for NASAL specimens in patients 97 years of age and older), is one component of a comprehensive surveillance program. It is not intended to diagnose infection nor to guide or monitor treatment.   Aerobic/Anaerobic Culture (surgical/deep wound)     Status: None (Preliminary result)  Collection Time: 03/18/17 10:23 AM  Result  Value Ref Range Status   Specimen Description TISSUE RIGHT FOOT  Final   Special Requests NONE  Final   Gram Stain   Final    FEW WBC PRESENT, PREDOMINANTLY PMN NO ORGANISMS SEEN    Culture   Final    RARE GROUP B STREP(S.AGALACTIAE)ISOLATED TESTING AGAINST S. AGALACTIAE NOT ROUTINELY PERFORMED DUE TO PREDICTABILITY OF AMP/PEN/VAN SUSCEPTIBILITY. RARE ENTEROCOCCUS FAECALIS HOLDING FOR POSSIBLE ANAEROBE CRITICAL RESULT CALLED TO, READ BACK BY AND VERIFIED WITH: E. MCAULEY RN, AT 1018 03/19/17 BY D. VANHOOK REGARDING CULTURE GROWTH    Report Status PENDING  Incomplete   Organism ID, Bacteria ENTEROCOCCUS FAECALIS  Final      Susceptibility   Enterococcus faecalis - MIC*    AMPICILLIN <=2 SENSITIVE Sensitive     VANCOMYCIN 1 SENSITIVE Sensitive     GENTAMICIN SYNERGY SENSITIVE Sensitive     * RARE ENTEROCOCCUS FAECALIS     Labs: Basic Metabolic Panel: Recent Labs  Lab 03/20/17 0530 03/21/17 0345 03/22/17 0444 03/23/17 0253 03/24/17 0218  NA 134* 138 132* 134* 134*  K 3.3* 3.1* 4.2 3.7 3.7  CL 99* 101 98* 100* 101  CO2 _0 GLUCOSE 194* 103* 268* 238* 215*  BUN 6 <5* _1 CREATININE 1.46* 1.25* 1.14 1.53* 1.45*  CALCIUM 8.2* 8.8* 8.6* 8.6* 8.6*   Liver Function Tests: No results for input(s): AST, ALT, ALKPHOS, BILITOT, PROT, ALBUMIN in the last 168 hours. No results for input(s): LIPASE, AMYLASE in the last 168 hours. No results for input(s): AMMONIA in the last 168 hours. CBC: Recent Labs  Lab 03/20/17 0530 03/21/17 0345 03/22/17 0444 03/23/17 0253 03/24/17 0218  WBC 11.0* 10.3 14.4* 12.7* 10.9*  NEUTROABS 7.8* 7.7 13.0* 10.3* 7.9*  HGB 8.4* 9.2* 9.4* 9.3* 9.3*  HCT 26.2* 28.7* 28.5* 28.7* 28.5*  MCV 85.1 84.2 83.8 84.9 84.6  PLT 329 388 472* 534* 544*   Cardiac Enzymes: No results for input(s): CKTOTAL, CKMB, CKMBINDEX, TROPONINI in the last 168 hours. BNP: BNP (last 3 results) No results for input(s): BNP in the last 8760  hours.  ProBNP (last 3 results) No results for input(s): PROBNP in the last 8760 hours.  CBG: Recent Labs  Lab 03/23/17 1224 03/23/17 1641 03/23/17 2124 03/24/17 0804 03/24/17 1153  GLUCAP 180* 201* 204* 141* 73       Signed:  Alma Friendly, MD [facility] 03/24/2017, 2:21 PM

## 2017-03-24 NOTE — Care Management Important Message (Signed)
Important Message  Patient Details  Name: Keith [name] MRN: [id number] Date of Birth: [birth year]   Medicare Important Message Given:  Yes    Nazar Kuan Abena 03/24/2017, 10:40 AM

## 2017-03-24 NOTE — Progress Notes (Signed)
Patient has agree to go to SNF and has chosen [facility]. They are able to accept him today and will order his O42 meds.   Percell Locus Westly Hinnant LCSWA [phone]

## 2017-03-24 NOTE — Progress Notes (Signed)
Peripherally Inserted Central Catheter/Midline Placement  The IV Nurse has discussed with the patient and/or persons authorized to consent for the patient, the purpose of this procedure and the potential benefits and risks involved with this procedure.  The benefits include less needle sticks, lab draws from the catheter, and the patient may be discharged home with the catheter. Risks include, but not limited to, infection, bleeding, blood clot (thrombus formation), and puncture of an artery; nerve damage and irregular heartbeat and possibility to perform a PICC exchange if needed/ordered by physician.  Alternatives to this procedure were also discussed.  Bard Power PICC patient education guide, fact sheet on infection prevention and patient information card has been provided to patient /or left at bedside.    PICC/Midline Placement Documentation        Keith [name] 03/24/2017, 10:54 AM

## 2017-03-24 NOTE — Anesthesia Postprocedure Evaluation (Signed)
Anesthesia Post Note  Patient: Keith [name]  Procedure(s) Performed: IRRIGATION AND DEBRIDEMENT RIGHT FOOT AND APPLY SPLIT THICKNESS SKIN GRAFT AND WOUND VAC (Right )     Patient location during evaluation: PACU Anesthesia Type: General Level of consciousness: awake Pain management: pain level controlled Vital Signs Assessment: post-procedure vital signs reviewed and stable Respiratory status: spontaneous breathing Cardiovascular status: stable Postop Assessment: no apparent nausea or vomiting Anesthetic complications: no    Last Vitals:  Vitals:   03/23/17 2126 03/24/17 0522  BP: 135/77 112/69  Pulse: 82 77  Resp: 18 13  Temp: 36.9 C (!) 36.3 C  SpO2: 100%     Last Pain:  Vitals:   03/24/17 0826  TempSrc:   PainSc: 8    Pain Goal: Patients Stated Pain Goal: 2 (03/24/17 0437)               Aneisha Skyles JR,JOHN Mateo Flow

## 2017-03-24 NOTE — Progress Notes (Signed)
Physical Therapy Treatment Patient Details Name: Keith [name] MRN: [id number] DOB: Apr 15, 1965 Today's Date: 03/24/2017    History of Present Illness 51 y.o. male with history of HIV, diabetes mellitus type 2, hypertension presents to the ER with complaints of increasing pain and swelling in the right foot for the last few days. S/p incision and drainage by Dr. Louanne Skye 12/8 S/P IRRIGATION AND DEBRIDEMENT EXTREMITY excision skin and soft tissue muscle and bone with decompression of the abscess excision of the sesamoid bones, application of installation wound VAC 03/18/17 12/14   S/P IRRIGATION AND DEBRIDEMENT RIGHT FOOT AND APPLY SPLIT THICKNESS SKIN GRAFT AND WOUND VAC    PT Comments    Pt is making progress towards his goals, however continues to be limited in his mobility by decreased safety awareness especially in regards to his R LE NWB status and decreased knowledge of RW usage. Pt is currently mod I for bed mobility, min guard for transfers and ambulation of 5 feet with RW. D/c plans remain appropriate.    Follow Up Recommendations  SNF     Equipment Recommendations  Rolling walker with 5" wheels;3in1 (PT)    Recommendations for Other Services       Precautions / Restrictions Precautions Precautions: Other (comment) Precaution Comments: Wound vac Restrictions Weight Bearing Restrictions: Yes RLE Weight Bearing: Non weight bearing    Mobility  Bed Mobility Overal bed mobility: Modified Independent                Transfers Overall transfer level: Needs assistance Equipment used: Rolling walker (2 wheeled) Transfers: Sit to/from Stand Sit to Stand: Min guard         General transfer comment: performed 10x sit<>stand vc for hand placement for safe power up and for maintaining NWB  Ambulation/Gait Ambulation/Gait assistance: Min guard Ambulation Distance (Feet): 5 Feet Assistive device: Rolling walker (2 wheeled) Gait Pattern/deviations: (hop to  pattern) Gait velocity: slowed Gait velocity interpretation: Below normal speed for age/gender General Gait Details: min guard for safety, vc for maintaining NWB, proximity to RW and       Balance Overall balance assessment: Needs assistance Sitting-balance support: No upper extremity supported;Feet supported Sitting balance-Leahy Scale: Good     Standing balance support: Bilateral upper extremity supported Standing balance-Leahy Scale: Poor Standing balance comment: requires bilateral UE support to maintain NWB and balance                            Cognition Arousal/Alertness: Awake/alert Behavior During Therapy: WFL for tasks assessed/performed Overall Cognitive Status: Within Functional Limits for tasks assessed                                        Exercises General Exercises - Lower Extremity Ankle Circles/Pumps: AROM;Both;Supine;Other reps (comment)(3 sets of 10 ) Short Arc Quad: AROM;Right;Supine(3 sets of 10 ) Heel Slides: AROM;Right;Supine(3 sets of 10 ) Hip ABduction/ADduction: AROM;Right;Supine(3 sets of 10 ) Straight Leg Raises: AROM;Right;Supine(3 sets of 10 )        Pertinent Vitals/Pain Pain Assessment: 0-10 Pain Score: 8  Pain Location: R foot Pain Descriptors / Indicators: Aching;Constant;Burning Pain Intervention(s): Limited activity within patient's tolerance;Monitored during session;Patient requesting pain meds-RN notified           PT Goals (current goals can now be found in the care plan section) Acute Rehab PT Goals Patient  Stated Goal: get better PT Goal Formulation: With patient Time For Goal Achievement: 04/02/17 Potential to Achieve Goals: Fair Progress towards PT goals: Progressing toward goals    Frequency    Min 3X/week      PT Plan Current plan remains appropriate       AM-PAC PT "6 Clicks" Daily Activity  Outcome Measure  Difficulty turning over in bed (including adjusting bedclothes,  sheets and blankets)?: A Little Difficulty moving from lying on back to sitting on the side of the bed? : A Little Difficulty sitting down on and standing up from a chair with arms (e.g., wheelchair, bedside commode, etc,.)?: Unable Help needed moving to and from a bed to chair (including a wheelchair)?: A Little Help needed walking in hospital room?: A Lot Help needed climbing 3-5 steps with a railing? : Total 6 Click Score: 13    End of Session Equipment Utilized During Treatment: Gait belt Activity Tolerance: Patient tolerated treatment well Patient left: in chair;with call bell/phone within reach;with chair alarm set Nurse Communication: Mobility status PT Visit Diagnosis: Unsteadiness on feet (R26.81);Other abnormalities of gait and mobility (R26.89);Muscle weakness (generalized) (M62.81);Difficulty in walking, not elsewhere classified (R26.2);Pain Pain - Right/Left: Right Pain - part of body: Ankle and joints of foot     Time: 1545-1620 PT Time Calculation (min) (ACUTE ONLY): 35 min  Charges:  $Therapeutic Exercise: 8-22 mins $Therapeutic Activity: 8-22 mins                    G Codes:       Graycen Sadlon B. Migdalia Dk PT, DPT Acute Rehabilitation  [phone] Pager [phone]     [place] 03/24/2017, 4:59 PM

## 2017-03-24 NOTE — Progress Notes (Signed)
Attempted to change patient to the prevena wound vac, air leak alert notified. Informed Erin, NP about the wound vac dressing needed to be changed. Instructed to removed wound vac dressing, place 4x4 gauze and wrap with colband and inform patient to follow up with [name] in one week.

## 2017-03-25 DIAGNOSIS — M25571 Pain in right ankle and joints of right foot: Secondary | ICD-10-CM | POA: Diagnosis not present

## 2017-03-25 DIAGNOSIS — R2689 Other abnormalities of gait and mobility: Secondary | ICD-10-CM | POA: Diagnosis not present

## 2017-03-26 DIAGNOSIS — E1165 Type 2 diabetes mellitus with hyperglycemia: Secondary | ICD-10-CM | POA: Diagnosis not present

## 2017-03-26 DIAGNOSIS — I10 Essential (primary) hypertension: Secondary | ICD-10-CM | POA: Diagnosis not present

## 2017-03-26 DIAGNOSIS — M86179 Other acute osteomyelitis, unspecified ankle and foot: Secondary | ICD-10-CM | POA: Diagnosis not present

## 2017-03-26 DIAGNOSIS — N183 Chronic kidney disease, stage 3 (moderate): Secondary | ICD-10-CM | POA: Diagnosis not present

## 2017-03-27 DIAGNOSIS — M25571 Pain in right ankle and joints of right foot: Secondary | ICD-10-CM | POA: Diagnosis not present

## 2017-03-27 DIAGNOSIS — R2689 Other abnormalities of gait and mobility: Secondary | ICD-10-CM | POA: Diagnosis not present

## 2017-03-28 ENCOUNTER — Ambulatory Visit (INDEPENDENT_AMBULATORY_CARE_PROVIDER_SITE_OTHER): Payer: Medicare Other | Admitting: Family

## 2017-03-28 ENCOUNTER — Encounter (INDEPENDENT_AMBULATORY_CARE_PROVIDER_SITE_OTHER): Payer: Self-pay | Admitting: Family

## 2017-03-28 DIAGNOSIS — Z945 Skin transplant status: Secondary | ICD-10-CM | POA: Insufficient documentation

## 2017-03-28 DIAGNOSIS — L03115 Cellulitis of right lower limb: Secondary | ICD-10-CM

## 2017-03-28 LAB — AEROBIC/ANAEROBIC CULTURE W GRAM STAIN (SURGICAL/DEEP WOUND)

## 2017-03-28 MED ORDER — OXYCODONE-ACETAMINOPHEN 5-325 MG PO TABS: 1.0000 | ORAL_TABLET | Freq: Four times a day (QID) | ORAL | 0 refills | Status: DC | PRN

## 2017-03-28 NOTE — Progress Notes (Signed)
Post-Op Visit Note   Patient: Keith [name]           Date of Birth: June 23, 1965           MRN: [id number] Visit Date: 03/28/2017 PCP: Scot Jun, FNP  Chief Complaint: No chief complaint on file.   HPI:  HPI The patient is a 51 year old gentleman seen today 1 week status post irrigation and debridement right foot abscesses with split thickness skin grafting. Has been residing at skilled nursing. States does not bear weight. Today walking on right foot with walker and no post op shoe back to room. Surgical dressing damp.  Ortho Exam On examination of lateral ulcer skin graft in place with staples. Is dry. No drainage. No surrounding erythema or odor. Plantar wound is opening up. Staples and sutures in place. Does not probe to bone or tendon. Graft material stapled in place. No drainage, erythema, odor or sign of infection  Visit Diagnoses:  1. Cellulitis of right foot   2. S/P split thickness skin graft     Plan: silvadene dressings applied. Skilled nursing to do daily wound cleansing followed by silvadene dressings. Follow up in office in 10 days. Non weight bearing on right foot. Given a post op shoe.   Follow-Up Instructions: Return in about 2 weeks (around 04/10/2017).   Imaging: No results found.  Orders:  No orders of the defined types were placed in this encounter.  Meds ordered this encounter  Medications  . oxyCODONE-acetaminophen (PERCOCET/ROXICET) 5-325 MG tablet    Sig: Take 1-2 tablets by mouth every 6 (six) hours as needed for moderate pain or severe pain.    Dispense:  60 tablet    Refill:  0     PMFS History: Patient Active Problem List   Diagnosis Date Noted  . S/P split thickness skin graft 03/28/2017  . Abscess of right foot   . Sepsis ([place]) 03/15/2017  . CKD (chronic kidney disease) stage 3, GFR 30-59 ml/min (HCC) 03/15/2017  . Cellulitis of right foot   . Diabetic polyneuropathy associated with type 2 diabetes mellitus ([place]) 01/23/2017   . Right foot ulcer, limited to breakdown of skin ([place]) 01/07/2017  . Osteomyelitis ([place]) 01/07/2017  . Dehydration 09/29/2016  . AKI (acute kidney injury) ([place]) 09/28/2016  . Diarrhea with dehydration 09/28/2016  . Nausea vomiting and diarrhea 09/28/2016  . Onychomycosis of multiple toenails with type 2 diabetes mellitus ([place]) 08/29/2015  . MRSA carrier 04/20/2014  . Testosterone deficiency 04/20/2014  . Genital warts 02/22/2014  . HIV disease ([place])   . Insulin-requiring or dependent type II diabetes mellitus ([place]) 02/04/2014  . Dental anomaly 11/20/2012  . Arthritis of right knee 02/23/2012  . Hyperlipidemia 11/10/2011  . Chronic pain 08/07/2011  . Meralgia paraesthetica 04/23/2011  . ERECTILE DYSFUNCTION 08/22/2008  . Essential hypertension 05/19/2008   Past Medical History:  Diagnosis Date  . AIDS ([place])   . Chronic knee pain    right  . Chronic pain   . Diabetes mellitus   . Diabetes type 2, uncontrolled ([place])    HgA1c 17.6 (04/27/2010)  . Diabetic foot ulcer ([place]) 01/2017   right foot  . Erectile dysfunction   . Genital warts   . HIV (human immunodeficiency virus infection) ([place]) 2009   CD4 count 100, VL 13800 (05/01/2010)  . Hypertension   . Osteomyelitis ([place])    h/o hand    Family History  Problem Relation Age of Onset  . Hypertension Mother   .  Arthritis Father   . Hypertension Father   . Hypertension Brother   . Cancer Maternal Grandmother 60       unknown type of cancer  . Depression Paternal Grandmother     Past Surgical History:  Procedure Laterality Date  . HERNIA REPAIR    . I&D EXTREMITY Left 08/21/2014   Procedure: INCISION AND DRAINAGE LEFT SMALL FINGER;  Surgeon: Leanora Cover, MD;  Location: [facility];  Service: Orthopedics;  Laterality: Left;  . I&D EXTREMITY Right 03/18/2017   Procedure: IRRIGATION AND DEBRIDEMENT EXTREMITY;  Surgeon: [name], MD;  Location: [facility];  Service: Orthopedics;  Laterality: Right;  . MINOR IRRIGATION AND  DEBRIDEMENT OF WOUND Right 04/22/2014   Procedure: IRRIGATION AND DEBRIDEMENT OF RIGHT NECK ABCESS;  Surgeon: Jerrell Belfast, MD;  Location: [facility];  Service: ENT;  Laterality: Right;  . MULTIPLE EXTRACTIONS WITH ALVEOLOPLASTY N/A 01/18/2013   Procedure: MULTIPLE EXTRACION 3, 6, 7, 10, 11, 13, 21, 22, 27, 28, 29, 30 WITH ALVEOLOPLASTY;  Surgeon: Gae Bon, DDS;  Location: [facility];  Service: Oral Surgery;  Laterality: N/A;  . SKIN SPLIT GRAFT Right 03/21/2017   Procedure: IRRIGATION AND DEBRIDEMENT RIGHT FOOT AND APPLY SPLIT THICKNESS SKIN GRAFT AND WOUND VAC;  Surgeon: [name], MD;  Location: [facility];  Service: Orthopedics;  Laterality: Right;   Social History   Occupational History  . Not on file  Tobacco Use  . Smoking status: Never Smoker  . Smokeless tobacco: Never Used  Substance and Sexual Activity  . Alcohol use: No    Alcohol/week: 0.0 oz  . Drug use: No  . Sexual activity: Yes    Partners: Female    Comment: given condoms

## 2017-04-07 DIAGNOSIS — E11621 Type 2 diabetes mellitus with foot ulcer: Secondary | ICD-10-CM | POA: Diagnosis not present

## 2017-04-07 DIAGNOSIS — I10 Essential (primary) hypertension: Secondary | ICD-10-CM | POA: Diagnosis not present

## 2017-04-07 DIAGNOSIS — M86179 Other acute osteomyelitis, unspecified ankle and foot: Secondary | ICD-10-CM | POA: Diagnosis not present

## 2017-04-07 DIAGNOSIS — L97509 Non-pressure chronic ulcer of other part of unspecified foot with unspecified severity: Secondary | ICD-10-CM | POA: Diagnosis not present

## 2017-04-07 DIAGNOSIS — R509 Fever, unspecified: Secondary | ICD-10-CM

## 2017-04-10 DIAGNOSIS — M25571 Pain in right ankle and joints of right foot: Secondary | ICD-10-CM | POA: Diagnosis not present

## 2017-04-10 DIAGNOSIS — R2689 Other abnormalities of gait and mobility: Secondary | ICD-10-CM | POA: Diagnosis not present

## 2017-04-11 ENCOUNTER — Ambulatory Visit: Payer: Medicare Other | Admitting: Family Medicine

## 2017-04-14 ENCOUNTER — Ambulatory Visit (INDEPENDENT_AMBULATORY_CARE_PROVIDER_SITE_OTHER): Payer: Medicare Other | Admitting: Orthopedic Surgery

## 2017-04-14 ENCOUNTER — Encounter (INDEPENDENT_AMBULATORY_CARE_PROVIDER_SITE_OTHER): Payer: Self-pay | Admitting: Family

## 2017-04-14 VITALS — Ht 69.0 in | Wt 258.0 lb

## 2017-04-14 DIAGNOSIS — L97511 Non-pressure chronic ulcer of other part of right foot limited to breakdown of skin: Secondary | ICD-10-CM

## 2017-04-14 DIAGNOSIS — Z945 Skin transplant status: Secondary | ICD-10-CM

## 2017-04-14 DIAGNOSIS — E1142 Type 2 diabetes mellitus with diabetic polyneuropathy: Secondary | ICD-10-CM

## 2017-04-14 NOTE — Progress Notes (Signed)
Office Visit Note   Patient: Keith [name]           Date of Birth: 1966-02-01           MRN: [id number] Visit Date: 04/14/2017              Requested by: Scot Jun, [place], [place] [address] PCP: Scot Jun, FNP  Chief Complaint  Patient presents with  . Right Foot - Routine Post Op    03/21/17 right foot abscess ST[facility]      HPI: Patient presents in follow-up 3 weeks status post debridement of abscess right foot local tissue rearrangement and application of split-thickness skin graft and amputation of the sesamoids.  Patient is currently on IV antibiotics he is supposed to be nonweightbearing he walks in without any assistive devices.  Patient states that he is having daily dressing changes at skilled nursing.  Assessment & Plan: Visit Diagnoses:  1. Right foot ulcer, limited to breakdown of skin ([place])   2. Diabetic polyneuropathy associated with type 2 diabetes mellitus ([place])   3. S/P split thickness skin graft     Plan: Discussed the importance of nonweightbearing he needs to be back on his walker he states that he is going to be discharged from skilled nursing shortly discussed that he will need to have this dressing change with home health nursing 3 times a week strict nonweightbearing and will reevaluate in 2 weeks.  Follow-Up Instructions: Return in about 2 weeks (around 04/28/2017).   Ortho Exam  Patient is alert, oriented, no adenopathy, well-dressed, normal affect, normal respiratory effort. Examination patient has maceration and breakdown of the wound from weightbearing.  There is no cellulitis no odor no drainage no signs of infection.  Sutures and staples were removed.  The wound at the base of the fifth metatarsal is stable.  Imaging: No results found. No images are attached to the encounter.  Labs: Lab Results  Component Value Date   HGBA1C 10.1 (H) 03/15/2017   HGBA1C 8.2 01/01/2017   HGBA1C 8.5 10/24/2016   ESRSEDRATE  127 (H) 03/15/2017   ESRSEDRATE 94 (H) 01/07/2017   ESRSEDRATE 80 (H) 07/25/2009   CRP 23.1 (H) 03/15/2017   CRP 1.8 (H) 01/08/2017   CRP 0.4 (L) 07/25/2009   LABURIC 6.3 08/07/2009   REPTSTATUS 03/28/2017 FINAL 03/18/2017   GRAMSTAIN  03/18/2017    FEW WBC PRESENT, PREDOMINANTLY PMN NO ORGANISMS SEEN    CULT  03/18/2017    RARE GROUP B STREP(S.AGALACTIAE)ISOLATED TESTING AGAINST S. AGALACTIAE NOT ROUTINELY PERFORMED DUE TO PREDICTABILITY OF AMP/PEN/VAN SUSCEPTIBILITY. RARE ENTEROCOCCUS FAECALIS FEW CANDIDA SPECIES, NOT ALBICANS CRITICAL RESULT CALLED TO, READ BACK BY AND VERIFIED WITH: E. MCAULEY RN, AT 1761 03/19/17 BY D. VANHOOK REGARDING CULTURE GROWTH NO ANAEROBES ISOLATED    LABORGA ENTEROCOCCUS FAECALIS 03/18/2017    @LABSALLVALUES (HGBA1)@  Body mass index is 38.1 kg/m.  Orders:  No orders of the defined types were placed in this encounter.  No orders of the defined types were placed in this encounter.    Procedures: No procedures performed  Clinical Data: No additional findings.  ROS:  All other systems negative, except as noted in the HPI. Review of Systems  Objective: Vital Signs: Ht 5\' 9"  (1.753 m)   Wt 258 lb (117 kg)   BMI 38.10 kg/m   Specialty Comments:  No specialty comments available.  PMFS History: Patient Active Problem List   Diagnosis Date Noted  . Fever   .  S/P split thickness skin graft 03/28/2017  . Abscess of right foot   . Sepsis ([place]) 03/15/2017  . CKD (chronic kidney disease) stage 3, GFR 30-59 ml/min (HCC) 03/15/2017  . Cellulitis of right foot   . Diabetic polyneuropathy associated with type 2 diabetes mellitus ([place]) 01/23/2017  . Right foot ulcer, limited to breakdown of skin ([place]) 01/07/2017  . Osteomyelitis ([place]) 01/07/2017  . Dehydration 09/29/2016  . AKI (acute kidney injury) ([place]) 09/28/2016  . Diarrhea with dehydration 09/28/2016  . Nausea vomiting and diarrhea 09/28/2016  . Onychomycosis of multiple toenails  with type 2 diabetes mellitus ([place]) 08/29/2015  . MRSA carrier 04/20/2014  . Testosterone deficiency 04/20/2014  . Genital warts 02/22/2014  . HIV disease ([place])   . Insulin-requiring or dependent type II diabetes mellitus ([place]) 02/04/2014  . Cough 05/04/2013  . Dental anomaly 11/20/2012  . Arthritis of right knee 02/23/2012  . Hyperlipidemia 11/10/2011  . Chronic pain 08/07/2011  . Meralgia paraesthetica 04/23/2011  . ERECTILE DYSFUNCTION 08/22/2008  . Essential hypertension 05/19/2008   Past Medical History:  Diagnosis Date  . AIDS ([place])   . Chronic knee pain    right  . Chronic pain   . Diabetes mellitus   . Diabetes type 2, uncontrolled ([place])    HgA1c 17.6 (04/27/2010)  . Diabetic foot ulcer ([place]) 01/2017   right foot  . Erectile dysfunction   . Genital warts   . HIV (human immunodeficiency virus infection) ([place]) 2009   CD4 count 100, VL 13800 (05/01/2010)  . Hypertension   . Osteomyelitis ([place])    h/o hand    Family History  Problem Relation Age of Onset  . Hypertension Mother   . Arthritis Father   . Hypertension Father   . Hypertension Brother   . Cancer Maternal Grandmother 25       unknown type of cancer  . Depression Paternal Grandmother     Past Surgical History:  Procedure Laterality Date  . HERNIA REPAIR    . I&D EXTREMITY Left 08/21/2014   Procedure: INCISION AND DRAINAGE LEFT SMALL FINGER;  Surgeon: Leanora Cover, MD;  Location: [facility];  Service: Orthopedics;  Laterality: Left;  . I&D EXTREMITY Right 03/18/2017   Procedure: IRRIGATION AND DEBRIDEMENT EXTREMITY;  Surgeon: [name], MD;  Location: [facility];  Service: Orthopedics;  Laterality: Right;  . MINOR IRRIGATION AND DEBRIDEMENT OF WOUND Right 04/22/2014   Procedure: IRRIGATION AND DEBRIDEMENT OF RIGHT NECK ABCESS;  Surgeon: Jerrell Belfast, MD;  Location: [facility];  Service: ENT;  Laterality: Right;  . MULTIPLE EXTRACTIONS WITH ALVEOLOPLASTY N/A 01/18/2013   Procedure: MULTIPLE EXTRACION 3, 6, 7,  10, 11, 13, 21, 22, 27, 28, 29, 30 WITH ALVEOLOPLASTY;  Surgeon: Gae Bon, DDS;  Location: [facility];  Service: Oral Surgery;  Laterality: N/A;  . SKIN SPLIT GRAFT Right 03/21/2017   Procedure: IRRIGATION AND DEBRIDEMENT RIGHT FOOT AND APPLY SPLIT THICKNESS SKIN GRAFT AND WOUND VAC;  Surgeon: [name], MD;  Location: [facility];  Service: Orthopedics;  Laterality: Right;   Social History   Occupational History  . Not on file  Tobacco Use  . Smoking status: Never Smoker  . Smokeless tobacco: Never Used  Substance and Sexual Activity  . Alcohol use: No    Alcohol/week: 0.0 oz  . Drug use: No  . Sexual activity: Yes    Partners: Female    Comment: given condoms

## 2017-04-15 ENCOUNTER — Ambulatory Visit (INDEPENDENT_AMBULATORY_CARE_PROVIDER_SITE_OTHER): Payer: Medicare Other | Admitting: Infectious Diseases

## 2017-04-15 ENCOUNTER — Encounter: Payer: Self-pay | Admitting: Infectious Diseases

## 2017-04-15 DIAGNOSIS — E119 Type 2 diabetes mellitus without complications: Secondary | ICD-10-CM | POA: Diagnosis not present

## 2017-04-15 DIAGNOSIS — B20 Human immunodeficiency virus [HIV] disease: Secondary | ICD-10-CM

## 2017-04-15 DIAGNOSIS — Z794 Long term (current) use of insulin: Secondary | ICD-10-CM

## 2017-04-15 DIAGNOSIS — M86271 Subacute osteomyelitis, right ankle and foot: Secondary | ICD-10-CM | POA: Diagnosis not present

## 2017-04-15 MED ORDER — AMOXICILLIN-POT CLAVULANATE 875-125 MG PO TABS: 1.0000 | ORAL_TABLET | Freq: Two times a day (BID) | ORAL | 1 refills | Status: DC

## 2017-04-15 NOTE — Progress Notes (Signed)
   Subjective:    [name], male    DOB: 07-12-1965, 52 y.o.   MRN: [id number]  HPI 52 y.o. M with HIV+ on triumeq-prezcobix. Also has DM with DFU, and diabetic neuropathy.hgba1c of 10.6.  He was adm on 12-8 with right foot ulcer since late September. He was found to have WBC of 21K, 86^ N, ESR 127, pseudohyponatremia of Na 126 due to BS of 500.  He was prev hospitalized in early October for his DFU, management where he was seen by dr duda. At that time, he was given 14 d of doxycycline and wound care instructions plus darco boot to offload. During that hospitalization he developed aki with cr going from 1 to 3.5 thought in part related to vancomycin. He saw dr duda in follow up in mid October. His mri (12-8) showes evidence of osteo to great toe but also a plantar abscess. Dr Louanne Skye performed bedside debridement of abscess. Cultures grew MSSA and GBS.  He underwent repeat I & D on 12-14 with split thickness skin graft.  He was d/c 03-24-17 on unasyn to complete on 04-17-17.  He was d/c to SNF. He wishes to leave tomorrow (states his insurance has run out).   He was seen by Dr Sharol Given on 04-14-17 and was noted to need home health for ongoing wound care TIW.    HIV 1 RNA Quant (copies/mL)  Date Value  01/14/2017 <20  05/13/2016 <20 DETECTED (A)  08/29/2015 <20   CD4 T Cell Abs (/uL)  Date Value  05/13/2016 210 (L)  08/29/2015 220 (L)  01/06/2015 140 (L)     Review of Systems  Constitutional: Negative for appetite change, chills, fever and unexpected weight change.  Eyes: Negative for visual disturbance.  Gastrointestinal: Negative for constipation and diarrhea.  Genitourinary: Negative for difficulty urinating.  Musculoskeletal: Positive for arthralgias.  Psychiatric/Behavioral: Negative for sleep disturbance.  Please see HPI. All other systems reviewed and negative.     Objective:   Physical Exam  Constitutional: He appears well-developed and well-nourished.    HENT:  Mouth/Throat: No oropharyngeal exudate.  Eyes: EOM are normal. Pupils are equal, round, and reactive to light.  Neck: Neck supple.  Cardiovascular: Normal rate, regular rhythm and normal heart sounds.  Pulmonary/Chest: Effort normal and breath sounds normal.  Abdominal: Soft. Bowel sounds are normal. There is no tenderness. There is no rebound.  Musculoskeletal: He exhibits no tenderness.  Lymphadenopathy:    He has no cervical adenopathy.    Not clear that his graft is viable.  There is no d/c.  Non-tender.         Assessment & Plan:

## 2017-04-15 NOTE — Assessment & Plan Note (Signed)
Will change him to augmentin after he completes his IV (his PIC site is clean, non-tender).  Will see ortho next week Will see him back in 2-3 weeks.

## 2017-04-15 NOTE — Assessment & Plan Note (Signed)
States his Glc was normal this AM.  Will continue to follow with PCP

## 2017-04-15 NOTE — Assessment & Plan Note (Signed)
Will continue his current ART His numbers look good.  He has gotten flu shot.  Will see him back in 2-3 weeks.

## 2017-04-16 DIAGNOSIS — B20 Human immunodeficiency virus [HIV] disease: Secondary | ICD-10-CM | POA: Diagnosis not present

## 2017-04-16 DIAGNOSIS — E1165 Type 2 diabetes mellitus with hyperglycemia: Secondary | ICD-10-CM | POA: Diagnosis not present

## 2017-04-16 DIAGNOSIS — I10 Essential (primary) hypertension: Secondary | ICD-10-CM | POA: Diagnosis not present

## 2017-04-16 DIAGNOSIS — N183 Chronic kidney disease, stage 3 (moderate): Secondary | ICD-10-CM | POA: Diagnosis not present

## 2017-04-18 ENCOUNTER — Telehealth (INDEPENDENT_AMBULATORY_CARE_PROVIDER_SITE_OTHER): Payer: Self-pay | Admitting: Orthopedic Surgery

## 2017-04-18 ENCOUNTER — Other Ambulatory Visit (INDEPENDENT_AMBULATORY_CARE_PROVIDER_SITE_OTHER): Payer: Self-pay | Admitting: Family

## 2017-04-18 NOTE — Telephone Encounter (Signed)
IC patient and LMVM advising to call office to come in next week to have this re evaluated again. Pam cell [phone]. Her orders stated he needed QD changes of slivadene dressing, and I told her 3 x week x 4 weeks was ok.

## 2017-04-18 NOTE — Telephone Encounter (Signed)
perfect

## 2017-04-18 NOTE — Telephone Encounter (Signed)
Pam, nurse at [facility] at [facility] needs wound orders for this patient. Please advise # [phone]

## 2017-04-18 NOTE — Telephone Encounter (Signed)
IC and s/w Pam and she says that patient is non compliant with the walker, he is walking on the foot, and he is driving.  She thinks he needs to be seen sooner.

## 2017-04-18 NOTE — Telephone Encounter (Signed)
Per office note:  Discussed the importance of nonweightbearing he needs to be back on his walker he states that he is going to be discharged from skilled nursing shortly discussed that he will need to have this dressing change with home health nursing 3 times a week strict nonweightbearing and will reevaluate in 2 weeks.  I can call her back to advise- can you advise what kind of dressings are they doing?

## 2017-04-22 ENCOUNTER — Telehealth (INDEPENDENT_AMBULATORY_CARE_PROVIDER_SITE_OTHER): Payer: Self-pay | Admitting: Radiology

## 2017-04-22 NOTE — Telephone Encounter (Signed)
Keith [name] was calling to let us know that she went to the patients home and she states that his mother answered the door and she was able to talk to her about the patient.  The mom states that he wasn't home and that she states he has a history of "wondering off" and she doesn't know when he will be back.  Keith [name] asked her when he had his dressing changed last and per mom it has not been changed since Friday, and Per Keith [name] it is to be changed daily.  Keith [name] is concerned that this is an infection risk for the patient. She states that they will continue to reach out to the patient and try to schedule him.  Per Keith [name] they will D/c him after 3 missed appts.  Also if he is out wondering the streets walking then he is not really considered home bound per Medicare.

## 2017-04-23 ENCOUNTER — Telehealth (INDEPENDENT_AMBULATORY_CARE_PROVIDER_SITE_OTHER): Payer: Self-pay | Admitting: Orthopedic Surgery

## 2017-04-23 NOTE — Telephone Encounter (Signed)
Cindy with [facility] called to let [name] know they will not be able to continue seeing this patient because his payors require him to be home bound and he is not. Is there any other way? Please advise # [phone]

## 2017-04-23 NOTE — Telephone Encounter (Signed)
Home health nurse called and states that she is with the pt right now and that he did not pick up his rx for silvadene and that she is using a silver gel for today. Pt has an appt with [name] tomorrow and states that the pt has not been compliant with weightbearing and elevation. The wound is large and has a foul odor. There is no visible bone but it is open and draining copious amounts. I advised that I would call her and update patients wound care orders after visit tomorrow. Will hold until then.

## 2017-04-24 ENCOUNTER — Ambulatory Visit (INDEPENDENT_AMBULATORY_CARE_PROVIDER_SITE_OTHER): Payer: Medicare Other | Admitting: Family

## 2017-04-24 ENCOUNTER — Encounter (INDEPENDENT_AMBULATORY_CARE_PROVIDER_SITE_OTHER): Payer: Self-pay | Admitting: Orthopedic Surgery

## 2017-04-24 VITALS — Ht 69.0 in | Wt 266.0 lb

## 2017-04-24 DIAGNOSIS — Z945 Skin transplant status: Secondary | ICD-10-CM

## 2017-04-24 DIAGNOSIS — L02611 Cutaneous abscess of right foot: Secondary | ICD-10-CM

## 2017-04-24 MED ORDER — SILVER SULFADIAZINE 1 % EX CREA: 1.0000 "application " | TOPICAL_CREAM | Freq: Every day | CUTANEOUS | 0 refills | Status: DC

## 2017-04-24 MED ORDER — OXYCODONE-ACETAMINOPHEN 5-325 MG PO TABS: 1.0000 | ORAL_TABLET | Freq: Four times a day (QID) | ORAL | 0 refills | Status: DC | PRN

## 2017-04-24 NOTE — Telephone Encounter (Signed)
Pt was advised at office vist that home health will not see him anymore and was taught in office how to apply dressing

## 2017-04-24 NOTE — Progress Notes (Signed)
Office Visit Note   Patient: Keith [name]           Date of Birth: July 03, 1965           MRN: [id number] Visit Date: 04/24/2017              Requested by: Scot Jun, [place], [place] [address] PCP: Scot Jun, FNP  Chief Complaint  Patient presents with  . Right Foot - Routine Post Op    03/18/17 right foot I&D abscess foot and skin graft      HPI: Patient presents in follow-up status post debridement of abscess right foot local tissue rearrangement and application of split-thickness skin graft and amputation of the sesamoids on 03/21/17.  He is supposed to be nonweightbearing he walks in without any assistive devices. States has not assistive devices at home. HH has dismissed the patient as he is not home bound.  On last [facility] visit, he was not home, mother had told the nurse "he had wandered off which he does from time to time."    Patient concerned about his wound care. Feels he is unable to do his own dressing changes.  Assessment & Plan: Visit Diagnoses:  1. S/P split thickness skin graft   2. Abscess of right foot     Plan: Discussed the importance of nonweightbearing. he needs to use assistive devices. Given a pair of crutches today, agrees to use these. Discussed that he will need to do daily dressing changes. HH may teach how to do this dressing. Will follow closely, follow up in 1 week.  Discussed importance of proper wound care, possibility of need for limb salvage surgery if we cannot get this to heal.  Follow-Up Instructions: Return in about 1 week (around 05/01/2017).   Ortho Exam  Patient is alert, oriented, no adenopathy, well-dressed, normal affect, normal respiratory effort. Examination patient has maceration and breakdown of the wound from weightbearing.  There is no cellulitis no odor no drainage no signs of infection.  Sutures and staples were removed.  The wound at the base of the fifth metatarsal is stable. Graft  material in place, is dry. 100% exudative tissue. No drainage or surrounding erythema. No sign of infection.  Imaging: No results found. No images are attached to the encounter.  Labs: Lab Results  Component Value Date   HGBA1C 10.1 (H) 03/15/2017   HGBA1C 8.2 01/01/2017   HGBA1C 8.5 10/24/2016   ESRSEDRATE 127 (H) 03/15/2017   ESRSEDRATE 94 (H) 01/07/2017   ESRSEDRATE 80 (H) 07/25/2009   CRP 23.1 (H) 03/15/2017   CRP 1.8 (H) 01/08/2017   CRP 0.4 (L) 07/25/2009   LABURIC 6.3 08/07/2009   REPTSTATUS 03/28/2017 FINAL 03/18/2017   GRAMSTAIN  03/18/2017    FEW WBC PRESENT, PREDOMINANTLY PMN NO ORGANISMS SEEN    CULT  03/18/2017    RARE GROUP B STREP(S.AGALACTIAE)ISOLATED TESTING AGAINST S. AGALACTIAE NOT ROUTINELY PERFORMED DUE TO PREDICTABILITY OF AMP/PEN/VAN SUSCEPTIBILITY. RARE ENTEROCOCCUS FAECALIS FEW CANDIDA SPECIES, NOT ALBICANS CRITICAL RESULT CALLED TO, READ BACK BY AND VERIFIED WITH: E. MCAULEY RN, AT [phone] 03/19/17 BY D. VANHOOK REGARDING CULTURE GROWTH NO ANAEROBES ISOLATED    LABORGA ENTEROCOCCUS FAECALIS 03/18/2017    @LABSALLVALUES (HGBA1)@  Body mass index is 39.28 kg/m.  Orders:  No orders of the defined types were placed in this encounter.  Meds ordered this encounter  Medications  . oxyCODONE-acetaminophen (PERCOCET/ROXICET) 5-325 MG tablet    Sig: Take 1 tablet by mouth  every 6 (six) hours as needed for moderate pain or severe pain.    Dispense:  30 tablet    Refill:  0  . silver sulfADIAZINE (SILVADENE) 1 % cream    Sig: Apply 1 application topically daily.    Dispense:  50 g    Refill:  0     Procedures: No procedures performed  Clinical Data: No additional findings.  ROS:  All other systems negative, except as noted in the HPI. Review of Systems  Constitutional: Negative for chills and fever.  Skin: Positive for color change and wound. Negative for rash.    Objective: Vital Signs: Ht 5\' 9"  (1.753 m)   Wt 266 lb (120.7 kg)   BMI  39.28 kg/m   Specialty Comments:  No specialty comments available.  PMFS History: Patient Active Problem List   Diagnosis Date Noted  . Fever   . S/P split thickness skin graft 03/28/2017  . Abscess of right foot   . Sepsis ([place]) 03/15/2017  . CKD (chronic kidney disease) stage 3, GFR 30-59 ml/min (HCC) 03/15/2017  . Cellulitis of right foot   . Diabetic polyneuropathy associated with type 2 diabetes mellitus ([place]) 01/23/2017  . Right foot ulcer, limited to breakdown of skin ([place]) 01/07/2017  . Osteomyelitis ([place]) 01/07/2017  . Dehydration 09/29/2016  . AKI (acute kidney injury) ([place]) 09/28/2016  . Diarrhea with dehydration 09/28/2016  . Nausea vomiting and diarrhea 09/28/2016  . Onychomycosis of multiple toenails with type 2 diabetes mellitus ([place]) 08/29/2015  . MRSA carrier 04/20/2014  . Testosterone deficiency 04/20/2014  . Genital warts 02/22/2014  . HIV disease ([place])   . Insulin-requiring or dependent type II diabetes mellitus ([place]) 02/04/2014  . Cough 05/04/2013  . Dental anomaly 11/20/2012  . Arthritis of right knee 02/23/2012  . Hyperlipidemia 11/10/2011  . Chronic pain 08/07/2011  . Meralgia paraesthetica 04/23/2011  . ERECTILE DYSFUNCTION 08/22/2008  . Essential hypertension 05/19/2008   Past Medical History:  Diagnosis Date  . AIDS ([place])   . Chronic knee pain    right  . Chronic pain   . Diabetes mellitus   . Diabetes type 2, uncontrolled ([place])    HgA1c 17.6 (04/27/2010)  . Diabetic foot ulcer ([place]) 01/2017   right foot  . Erectile dysfunction   . Genital warts   . HIV (human immunodeficiency virus infection) ([place]) 2009   CD4 count 100, VL 13800 (05/01/2010)  . Hypertension   . Osteomyelitis ([place])    h/o hand    Family History  Problem Relation Age of Onset  . Hypertension Mother   . Arthritis Father   . Hypertension Father   . Hypertension Brother   . Cancer Maternal Grandmother 19       unknown type of cancer  . Depression Paternal  Grandmother     Past Surgical History:  Procedure Laterality Date  . HERNIA REPAIR    . I&D EXTREMITY Left 08/21/2014   Procedure: INCISION AND DRAINAGE LEFT SMALL FINGER;  Surgeon: Leanora Cover, MD;  Location: [facility];  Service: Orthopedics;  Laterality: Left;  . I&D EXTREMITY Right 03/18/2017   Procedure: IRRIGATION AND DEBRIDEMENT EXTREMITY;  Surgeon: [name], MD;  Location: [facility];  Service: Orthopedics;  Laterality: Right;  . MINOR IRRIGATION AND DEBRIDEMENT OF WOUND Right 04/22/2014   Procedure: IRRIGATION AND DEBRIDEMENT OF RIGHT NECK ABCESS;  Surgeon: Jerrell Belfast, MD;  Location: [facility];  Service: ENT;  Laterality: Right;  . MULTIPLE EXTRACTIONS WITH ALVEOLOPLASTY N/A 01/18/2013  Procedure: MULTIPLE EXTRACION 3, 6, 7, 10, 11, 13, 21, 22, 27, 28, 29, 30 WITH ALVEOLOPLASTY;  Surgeon: Gae Bon, DDS;  Location: [facility];  Service: Oral Surgery;  Laterality: N/A;  . SKIN SPLIT GRAFT Right 03/21/2017   Procedure: IRRIGATION AND DEBRIDEMENT RIGHT FOOT AND APPLY SPLIT THICKNESS SKIN GRAFT AND WOUND VAC;  Surgeon: [name], MD;  Location: [facility];  Service: Orthopedics;  Laterality: Right;   Social History   Occupational History  . Not on file  Tobacco Use  . Smoking status: Never Smoker  . Smokeless tobacco: Never Used  Substance and Sexual Activity  . Alcohol use: No    Alcohol/week: 0.0 oz  . Drug use: No  . Sexual activity: Yes    Partners: Female    Comment: given condoms

## 2017-04-28 ENCOUNTER — Encounter (HOSPITAL_COMMUNITY): Payer: Self-pay | Admitting: Emergency Medicine

## 2017-04-28 ENCOUNTER — Other Ambulatory Visit: Payer: Self-pay

## 2017-04-28 ENCOUNTER — Emergency Department (HOSPITAL_COMMUNITY): Payer: Medicare Other

## 2017-04-28 ENCOUNTER — Inpatient Hospital Stay (HOSPITAL_COMMUNITY)
Admission: EM | Admit: 2017-04-28 | Discharge: 2017-05-06 | DRG: 970 | Disposition: A | Discharge status: Home Health | Payer: Medicare Other | Attending: Family Medicine | Admitting: Family Medicine

## 2017-04-28 DIAGNOSIS — N183 Chronic kidney disease, stage 3 (moderate): Secondary | ICD-10-CM | POA: Diagnosis present

## 2017-04-28 DIAGNOSIS — R3129 Other microscopic hematuria: Secondary | ICD-10-CM | POA: Diagnosis present

## 2017-04-28 DIAGNOSIS — I10 Essential (primary) hypertension: Secondary | ICD-10-CM | POA: Diagnosis not present

## 2017-04-28 DIAGNOSIS — L97419 Non-pressure chronic ulcer of right heel and midfoot with unspecified severity: Secondary | ICD-10-CM | POA: Diagnosis not present

## 2017-04-28 DIAGNOSIS — E1122 Type 2 diabetes mellitus with diabetic chronic kidney disease: Secondary | ICD-10-CM | POA: Diagnosis present

## 2017-04-28 DIAGNOSIS — Z79899 Other long term (current) drug therapy: Secondary | ICD-10-CM

## 2017-04-28 DIAGNOSIS — M86671 Other chronic osteomyelitis, right ankle and foot: Secondary | ICD-10-CM | POA: Diagnosis not present

## 2017-04-28 DIAGNOSIS — Z882 Allergy status to sulfonamides status: Secondary | ICD-10-CM | POA: Diagnosis not present

## 2017-04-28 DIAGNOSIS — R600 Localized edema: Secondary | ICD-10-CM | POA: Diagnosis not present

## 2017-04-28 DIAGNOSIS — B9562 Methicillin resistant Staphylococcus aureus infection as the cause of diseases classified elsewhere: Secondary | ICD-10-CM | POA: Diagnosis not present

## 2017-04-28 DIAGNOSIS — E1142 Type 2 diabetes mellitus with diabetic polyneuropathy: Secondary | ICD-10-CM | POA: Diagnosis present

## 2017-04-28 DIAGNOSIS — Z21 Asymptomatic human immunodeficiency virus [HIV] infection status: Secondary | ICD-10-CM | POA: Diagnosis not present

## 2017-04-28 DIAGNOSIS — E119 Type 2 diabetes mellitus without complications: Secondary | ICD-10-CM | POA: Diagnosis not present

## 2017-04-28 DIAGNOSIS — L03115 Cellulitis of right lower limb: Secondary | ICD-10-CM | POA: Diagnosis not present

## 2017-04-28 DIAGNOSIS — E11621 Type 2 diabetes mellitus with foot ulcer: Secondary | ICD-10-CM | POA: Diagnosis not present

## 2017-04-28 DIAGNOSIS — R7881 Bacteremia: Secondary | ICD-10-CM | POA: Diagnosis not present

## 2017-04-28 DIAGNOSIS — E1169 Type 2 diabetes mellitus with other specified complication: Secondary | ICD-10-CM | POA: Diagnosis present

## 2017-04-28 DIAGNOSIS — Z794 Long term (current) use of insulin: Secondary | ICD-10-CM

## 2017-04-28 DIAGNOSIS — M79671 Pain in right foot: Secondary | ICD-10-CM | POA: Diagnosis not present

## 2017-04-28 DIAGNOSIS — T8140XA Infection following a procedure, unspecified, initial encounter: Secondary | ICD-10-CM | POA: Diagnosis not present

## 2017-04-28 DIAGNOSIS — L97509 Non-pressure chronic ulcer of other part of unspecified foot with unspecified severity: Secondary | ICD-10-CM | POA: Diagnosis not present

## 2017-04-28 DIAGNOSIS — M869 Osteomyelitis, unspecified: Secondary | ICD-10-CM

## 2017-04-28 DIAGNOSIS — A4102 Sepsis due to Methicillin resistant Staphylococcus aureus: Principal | ICD-10-CM | POA: Diagnosis present

## 2017-04-28 DIAGNOSIS — B20 Human immunodeficiency virus [HIV] disease: Secondary | ICD-10-CM

## 2017-04-28 DIAGNOSIS — T148XXA Other injury of unspecified body region, initial encounter: Secondary | ICD-10-CM | POA: Diagnosis not present

## 2017-04-28 DIAGNOSIS — I330 Acute and subacute infective endocarditis: Secondary | ICD-10-CM | POA: Diagnosis not present

## 2017-04-28 DIAGNOSIS — R509 Fever, unspecified: Secondary | ICD-10-CM | POA: Diagnosis not present

## 2017-04-28 DIAGNOSIS — M609 Myositis, unspecified: Secondary | ICD-10-CM | POA: Diagnosis present

## 2017-04-28 DIAGNOSIS — A419 Sepsis, unspecified organism: Secondary | ICD-10-CM | POA: Diagnosis not present

## 2017-04-28 DIAGNOSIS — Z89421 Acquired absence of other right toe(s): Secondary | ICD-10-CM | POA: Diagnosis not present

## 2017-04-28 DIAGNOSIS — L089 Local infection of the skin and subcutaneous tissue, unspecified: Secondary | ICD-10-CM | POA: Diagnosis not present

## 2017-04-28 DIAGNOSIS — I340 Nonrheumatic mitral (valve) insufficiency: Secondary | ICD-10-CM | POA: Diagnosis not present

## 2017-04-28 DIAGNOSIS — M8610 Other acute osteomyelitis, unspecified site: Secondary | ICD-10-CM | POA: Diagnosis not present

## 2017-04-28 DIAGNOSIS — Z978 Presence of other specified devices: Secondary | ICD-10-CM | POA: Diagnosis not present

## 2017-04-28 DIAGNOSIS — M86171 Other acute osteomyelitis, right ankle and foot: Secondary | ICD-10-CM | POA: Diagnosis not present

## 2017-04-28 DIAGNOSIS — I129 Hypertensive chronic kidney disease with stage 1 through stage 4 chronic kidney disease, or unspecified chronic kidney disease: Secondary | ICD-10-CM | POA: Diagnosis not present

## 2017-04-28 HISTORY — DX: Osteomyelitis, unspecified: M86.9

## 2017-04-28 LAB — COMPREHENSIVE METABOLIC PANEL
ALT: 18 U/L (ref 17–63)
AST: 20 U/L (ref 15–41)
Albumin: 3 g/dL — ABNORMAL LOW (ref 3.5–5.0)
Alkaline Phosphatase: 121 U/L (ref 38–126)
Anion gap: 11 (ref 5–15)
BUN: 14 mg/dL (ref 6–20)
CO2: 23 mmol/L (ref 22–32)
Calcium: 9.2 mg/dL (ref 8.9–10.3)
Chloride: 101 mmol/L (ref 101–111)
Creatinine, Ser: 1.56 mg/dL — ABNORMAL HIGH (ref 0.61–1.24)
GFR calc Af Amer: 57 mL/min — ABNORMAL LOW (ref >60)
GFR calc non Af Amer: 49 mL/min — ABNORMAL LOW (ref >60)
Glucose, Bld: 173 mg/dL — ABNORMAL HIGH (ref 65–99)
Potassium: 3.8 mmol/L (ref 3.5–5.1)
Sodium: 135 mmol/L (ref 135–145)
Total Bilirubin: 0.8 mg/dL (ref 0.3–1.2)
Total Protein: 8.4 g/dL — ABNORMAL HIGH (ref 6.5–8.1)

## 2017-04-28 LAB — CBC WITH DIFFERENTIAL/PLATELET
Basophils Absolute: 0 10*3/uL (ref 0.0–0.1)
Basophils Relative: 0 %
Eosinophils Absolute: 0.1 10*3/uL (ref 0.0–0.7)
Eosinophils Relative: 1 %
HCT: 28.1 % — ABNORMAL LOW (ref 39.0–52.0)
Hemoglobin: 9.3 g/dL — ABNORMAL LOW (ref 13.0–17.0)
Lymphocytes Relative: 14 %
Lymphs Abs: 1.9 10*3/uL (ref 0.7–4.0)
MCH: 28.4 pg (ref 26.0–34.0)
MCHC: 33.1 g/dL (ref 30.0–36.0)
MCV: 85.7 fL (ref 78.0–100.0)
Monocytes Absolute: 1.1 10*3/uL — ABNORMAL HIGH (ref 0.1–1.0)
Monocytes Relative: 8 %
Neutro Abs: 10.3 10*3/uL — ABNORMAL HIGH (ref 1.7–7.7)
Neutrophils Relative %: 77 %
Platelets: 363 10*3/uL (ref 150–400)
RBC: 3.28 MIL/uL — ABNORMAL LOW (ref 4.22–5.81)
RDW: 14.1 % (ref 11.5–15.5)
WBC: 13.4 10*3/uL — ABNORMAL HIGH (ref 4.0–10.5)

## 2017-04-28 LAB — I-STAT CG4 LACTIC ACID, ED
Lactic Acid, Venous: 0.98 mmol/L (ref 0.5–1.9)
Lactic Acid, Venous: 1.39 mmol/L (ref 0.5–1.9)

## 2017-04-28 MED ORDER — LORAZEPAM 0.5 MG PO TABS: 0.5000 mg | ORAL_TABLET | Freq: Four times a day (QID) | ORAL | Status: DC | PRN

## 2017-04-28 MED ORDER — INSULIN GLARGINE 100 UNIT/ML ~~LOC~~ SOLN
20.0000 [IU] | Freq: Every day | SUBCUTANEOUS | Status: DC
  Administered 2017-04-28 – 2017-05-06 (×9): 20 [IU] via SUBCUTANEOUS
  Filled 2017-04-28 (×9): qty 0.2

## 2017-04-28 MED ORDER — DEXTROSE 5 % IV SOLN
1.0000 g | Freq: Three times a day (TID) | INTRAVENOUS | Status: DC
  Administered 2017-04-28 – 2017-04-29 (×3): 1 g via INTRAVENOUS
  Filled 2017-04-28 (×4): qty 1

## 2017-04-28 MED ORDER — DARUNAVIR-COBICISTAT 800-150 MG PO TABS
1.0000 | ORAL_TABLET | Freq: Every day | ORAL | Status: DC
  Administered 2017-04-29 – 2017-05-06 (×8): 1 via ORAL
  Filled 2017-04-28 (×9): qty 1

## 2017-04-28 MED ORDER — VANCOMYCIN HCL 10 G IV SOLR
2000.0000 mg | Freq: Once | INTRAVENOUS | Status: AC
  Administered 2017-04-28: 2000 mg via INTRAVENOUS
  Filled 2017-04-28: qty 2000

## 2017-04-28 MED ORDER — DOCUSATE SODIUM 100 MG PO CAPS
100.0000 mg | ORAL_CAPSULE | Freq: Two times a day (BID) | ORAL | Status: DC
  Administered 2017-04-29 – 2017-05-03 (×5): 100 mg via ORAL
  Filled 2017-04-28 (×8): qty 1

## 2017-04-28 MED ORDER — INSULIN ASPART 100 UNIT/ML ~~LOC~~ SOLN
0.0000 [IU] | SUBCUTANEOUS | Status: DC
  Administered 2017-04-28: 3 [IU] via SUBCUTANEOUS
  Administered 2017-04-29 (×2): 2 [IU] via SUBCUTANEOUS

## 2017-04-28 MED ORDER — VANCOMYCIN HCL IN DEXTROSE 750-5 MG/150ML-% IV SOLN
750.0000 mg | Freq: Two times a day (BID) | INTRAVENOUS | Status: DC
Start: 2017-04-29 — End: 2017-05-04
  Administered 2017-04-29 – 2017-05-04 (×9): 750 mg via INTRAVENOUS
  Filled 2017-04-28 (×11): qty 150

## 2017-04-28 MED ORDER — POLYETHYLENE GLYCOL 3350 17 G PO PACK
17.0000 g | PACK | Freq: Every day | ORAL | Status: DC
  Administered 2017-05-04: 17 g via ORAL
  Filled 2017-04-28 (×4): qty 1

## 2017-04-28 MED ORDER — DEXTROSE 5 % IV SOLN
1.0000 g | Freq: Once | INTRAVENOUS | Status: AC
  Administered 2017-04-28: 1 g via INTRAVENOUS
  Filled 2017-04-28: qty 10

## 2017-04-28 MED ORDER — ENOXAPARIN SODIUM 40 MG/0.4ML ~~LOC~~ SOLN: 40.0000 mg | SUBCUTANEOUS | Status: DC

## 2017-04-28 MED ORDER — OXYCODONE HCL 5 MG PO TABS
5.0000 mg | ORAL_TABLET | ORAL | Status: DC | PRN
  Administered 2017-04-28 – 2017-05-03 (×10): 10 mg via ORAL
  Filled 2017-04-28 (×11): qty 2

## 2017-04-28 MED ORDER — ABACAVIR-DOLUTEGRAVIR-LAMIVUD 600-50-300 MG PO TABS
1.0000 | ORAL_TABLET | Freq: Every day | ORAL | Status: DC
  Administered 2017-04-28 – 2017-05-06 (×9): 1 via ORAL
  Filled 2017-04-28 (×9): qty 1

## 2017-04-28 MED ORDER — SENNA 8.6 MG PO TABS
1.0000 | ORAL_TABLET | Freq: Two times a day (BID) | ORAL | Status: DC
  Administered 2017-05-02 – 2017-05-03 (×3): 8.6 mg via ORAL
  Filled 2017-04-28 (×6): qty 1

## 2017-04-28 NOTE — ED Provider Notes (Signed)
[facility] DEPART[facility] Provider Note   CSN: [id number] Arrival date & time: 04/28/17  1329     History   Chief Complaint Chief Complaint  Patient presents with  . Wound Check    HPI Keith [name] is a 52 y.o. male.  Patient complains of drainage from the bottom of his right foot.  He has had an infection in this and had osteomyelitis.  He was admitted and placed on antibiotics a month ago.  The drainage started back a few days ago   The history is provided by the patient.  Foot Pain  This is a recurrent problem. The current episode started more than 2 days ago. The problem occurs constantly. The problem has not changed since onset.Pertinent negatives include no chest pain, no abdominal pain and no headaches. Nothing aggravates the symptoms. He has tried nothing for the symptoms. The treatment provided no relief.    Past Medical History:  Diagnosis Date  . AIDS ([place])   . Chronic knee pain    right  . Chronic pain   . Diabetes mellitus   . Diabetes type 2, uncontrolled ([place])    HgA1c 17.6 (04/27/2010)  . Diabetic foot ulcer ([place]) 01/2017   right foot  . Erectile dysfunction   . Genital warts   . HIV (human immunodeficiency virus infection) ([place]) 2009   CD4 count 100, VL 13800 (05/01/2010)  . Hypertension   . Osteomyelitis [facility])    h/o hand    Patient Active Problem List   Diagnosis Date Noted  . Fever   . S/P split thickness skin graft 03/28/2017  . Abscess of right foot   . Sepsis ([place]) 03/15/2017  . CKD (chronic kidney disease) stage 3, GFR 30-59 ml/min (HCC) 03/15/2017  . Cellulitis of right foot   . Diabetic polyneuropathy associated with type 2 diabetes mellitus ([place]) 01/23/2017  . Right foot ulcer, limited to breakdown of skin ([place]) 01/07/2017  . Osteomyelitis ([place]) 01/07/2017  . Dehydration 09/29/2016  . AKI (acute kidney injury) ([place]) 09/28/2016  . Diarrhea with dehydration 09/28/2016  . Nausea vomiting and diarrhea  09/28/2016  . Onychomycosis of multiple toenails with type 2 diabetes mellitus ([place]) 08/29/2015  . MRSA carrier 04/20/2014  . Testosterone deficiency 04/20/2014  . Genital warts 02/22/2014  . HIV disease ([place])   . Insulin-requiring or dependent type II diabetes mellitus ([place]) 02/04/2014  . Cough 05/04/2013  . Dental anomaly 11/20/2012  . Arthritis of right knee 02/23/2012  . Hyperlipidemia 11/10/2011  . Chronic pain 08/07/2011  . Meralgia paraesthetica 04/23/2011  . ERECTILE DYSFUNCTION 08/22/2008  . Essential hypertension 05/19/2008    Past Surgical History:  Procedure Laterality Date  . HERNIA REPAIR    . I&D EXTREMITY Left 08/21/2014   Procedure: INCISION AND DRAINAGE LEFT SMALL FINGER;  Surgeon: Leanora Cover, MD;  Location: [facility];  Service: Orthopedics;  Laterality: Left;  . I&D EXTREMITY Right 03/18/2017   Procedure: IRRIGATION AND DEBRIDEMENT EXTREMITY;  Surgeon: [name], MD;  Location: [facility];  Service: Orthopedics;  Laterality: Right;  . MINOR IRRIGATION AND DEBRIDEMENT OF WOUND Right 04/22/2014   Procedure: IRRIGATION AND DEBRIDEMENT OF RIGHT NECK ABCESS;  Surgeon: Jerrell Belfast, MD;  Location: [facility];  Service: ENT;  Laterality: Right;  . MULTIPLE EXTRACTIONS WITH ALVEOLOPLASTY N/A 01/18/2013   Procedure: MULTIPLE EXTRACION 3, 6, 7, 10, 11, 13, 21, 22, 27, 28, 29, 30 WITH ALVEOLOPLASTY;  Surgeon: Gae Bon, DDS;  Location: [facility];  Service:  Oral Surgery;  Laterality: N/A;  . SKIN SPLIT GRAFT Right 03/21/2017   Procedure: IRRIGATION AND DEBRIDEMENT RIGHT FOOT AND APPLY SPLIT THICKNESS SKIN GRAFT AND WOUND VAC;  Surgeon: [name], MD;  Location: [facility];  Service: Orthopedics;  Laterality: Right;       Home Medications    Prior to Admission medications   Medication Sig Start Date End Date Taking? Authorizing Provider  abacavir-dolutegravir-lamiVUDine (TRIUMEQ) 600-50-300 MG tablet Take 1 tablet by mouth daily. 01/20/17   [name], MD  amLODipine  (NORVASC) 10 MG tablet Take 1 tablet (10 mg total) by mouth daily. 01/17/17   Nita Sells, MD  amoxicillin-clavulanate (AUGMENTIN) 875-125 MG tablet Take 1 tablet by mouth 2 (two) times daily. 04/15/17   [name], MD  cyclobenzaprine (FLEXERIL) 10 MG tablet Take 1 tablet (10 mg total) by mouth 2 (two) times daily as needed for muscle spasms. 10/25/16   Ashley Murrain, NP  darunavir-cobicistat (PREZCOBIX) 800-150 MG tablet Take 1 tablet by mouth daily. Swallow whole. Do NOT crush, break or chew tablets. Take with food. 01/20/17   [name], MD  glimepiride (AMARYL) 1 MG tablet Take 1 tablet (1 mg total) by mouth every morning. 01/16/17 01/16/18  Nita Sells, MD  Insulin Glargine (LANTUS SOLOSTAR) 100 UNIT/ML Solostar Pen Inject 20 Units into the skin daily at 10 pm. 01/16/17   Nita Sells, MD  Insulin Syringes, Disposable, U-100 0.3 ML MISC For administration of insulin TID. ICD E-11.9 10/29/16   Scot Jun, FNP  LORazepam (ATIVAN) 1 MG tablet Take 1 tablet (1 mg total) by mouth every 6 (six) hours as needed for anxiety. 01/30/15   [name], MD  LORazepam (ATIVAN) 1 MG tablet Take 1 tablet (1 mg total) by mouth every 6 (six) hours as needed for anxiety. 03/24/17   Alma Friendly, MD  naloxone [facility]) nasal spray 4 mg/0.1 mL Place 1 spray into the nose daily as needed (opioid overdose).    [provider]  oxyCODONE-acetaminophen (PERCOCET/ROXICET) 5-325 MG tablet Take 1 tablet by mouth every 6 (six) hours as needed for moderate pain or severe pain. 04/24/17   Suzan Slick, NP  silver sulfADIAZINE (SILVADENE) 1 % cream Apply 1 application topically daily. 04/24/17   Suzan Slick, NP    Family History Family History  Problem Relation Age of Onset  . Hypertension Mother   . Arthritis Father   . Hypertension Father   . Hypertension Brother   . Cancer Maternal Grandmother 85       unknown type of cancer  . Depression  Paternal Grandmother     Social History Social History   Tobacco Use  . Smoking status: Never Smoker  . Smokeless tobacco: Never Used  Substance Use Topics  . Alcohol use: No    Alcohol/week: 0.0 oz  . Drug use: No     Allergies   Sulfa antibiotics   Review of Systems Review of Systems  Constitutional: Negative for appetite change and fatigue.  HENT: Negative for congestion, ear discharge and sinus pressure.   Eyes: Negative for discharge.  Respiratory: Negative for cough.   Cardiovascular: Negative for chest pain.  Gastrointestinal: Negative for abdominal pain and diarrhea.  Genitourinary: Negative for frequency and hematuria.  Musculoskeletal: Negative for back pain.       Right foot pain with drainage  Skin: Negative for rash.  Neurological: Negative for seizures and headaches.  Psychiatric/Behavioral: Negative for hallucinations.  Physical Exam Updated Vital Signs BP (!) 155/112   Pulse (!) 107   Temp 98.3 F (36.8 C) (Oral)   Resp 19   Ht 5\' 9"  (1.753 m)   Wt 115.7 kg (255 lb)   SpO2 100%   BMI 37.66 kg/m   Physical Exam  Constitutional: He is oriented to person, place, and time. He appears well-developed.  HENT:  Head: Normocephalic.  Eyes: Conjunctivae and EOM are normal. No scleral icterus.  Neck: Neck supple. No thyromegaly present.  Cardiovascular: Normal rate and regular rhythm. Exam reveals no gallop and no friction rub.  No murmur heard. Pulmonary/Chest: No stridor. He has no wheezes. He has no rales. He exhibits no tenderness.  Abdominal: He exhibits no distension. There is no tenderness. There is no rebound.  Musculoskeletal: Normal range of motion. He exhibits no edema.  Patient has purulent drainage from the bottom of his right foot.  It appears the infection has returned  Lymphadenopathy:    He has no cervical adenopathy.  Neurological: He is oriented to person, place, and time. He exhibits normal muscle tone. Coordination normal.    Skin: No rash noted. No erythema.  Psychiatric: He has a normal mood and affect. His behavior is normal.     ED Treatments / Results  Labs (all labs ordered are listed, but only abnormal results are displayed) Labs Reviewed  COMPREHENSIVE METABOLIC PANEL - Abnormal; Notable for the following components:      Result Value   Glucose, Bld 173 (*)    Creatinine, Ser 1.56 (*)    Total Protein 8.4 (*)    Albumin 3.0 (*)    GFR calc non Af Amer 49 (*)    GFR calc Af Amer 57 (*)    All other components within normal limits  CBC WITH DIFFERENTIAL/PLATELET - Abnormal; Notable for the following components:   WBC 13.4 (*)    RBC 3.28 (*)    Hemoglobin 9.3 (*)    HCT 28.1 (*)    Neutro Abs 10.3 (*)    Monocytes Absolute 1.1 (*)    All other components within normal limits  AEROBIC CULTURE (SUPERFICIAL SPECIMEN)  I-STAT CG4 LACTIC ACID, ED  I-STAT CG4 LACTIC ACID, ED    EKG  EKG Interpretation None       Radiology Dg Foot Complete Right  Result Date: 04/28/2017 CLINICAL DATA:  C/o pain to right foot. Best obtainable images due to patients pain. Hx diabetes, htn EXAM: RIGHT FOOT COMPLETE - 3+ VIEW COMPARISON:  03/14/2017 FINDINGS: Progressive patchy demineralization in the head of the first metatarsal. Otherwise normal mineralization. Great toe sesamoids are no longer visible possibly surgically resected. There is an associated plantar skin defect. Stap if le projects over the base of the fifth metatarsal. Normal alignment. IMPRESSION: 1. Progressive demineralization in the first metatarsal head suggesting osteomyelitis. Electronically Signed   By: Lucrezia Europe M.D.   On: 04/28/2017 19:10    Procedures Procedures (including critical care time)  Medications Ordered in ED Medications  cefTRIAXone (ROCEPHIN) 1 g in dextrose 5 % 50 mL IVPB (not administered)     Initial Impression / Assessment and Plan / ED Course  I have reviewed the triage vital signs and the nursing  notes.  Pertinent labs & imaging results that were available during my care of the patient were reviewed by me and considered in my medical decision making (see chart for details).     Osteomyelitis of right foot.  He will be admitted  and started back on IV antibiotics  Final Clinical Impressions(s) / ED Diagnoses   Final diagnoses:  Wound infection    ED Discharge Orders    None       Milton Ferguson, MD 04/28/17 [phone]

## 2017-04-28 NOTE — ED Triage Notes (Signed)
Pt is noncompliant diabetic./non compliant with right wound care./ Pt had recent surgery to right foot. Wound has bleeding and drainage with a foul odor.

## 2017-04-28 NOTE — ED Notes (Signed)
Patient transported to X-ray 

## 2017-04-28 NOTE — Progress Notes (Signed)
Pharmacy Antibiotic Note  Keith [name] is a 52 y.o. male admitted on 04/28/2017 with osteomyelitis.  Pharmacy has been consulted for vancomycin and cefepime dosing. Renal insufficiency noted with sCr 1.56 and CrCl 70 ml/min.  Vancomycin trough goal 15-20  Plan: 1) Vancomycin 2g IV x 1 then 750mg  IV q12 2) Cefepime 1g IV q8 3) Follow renal function, cultures, LOT, level as needed  Height: 5\' 9"  (175.3 cm) Weight: 255 lb (115.7 kg) IBW/kg (Calculated) : 70.7  Temp (24hrs), Avg:98.3 F (36.8 C), Min:98.3 F (36.8 C), Max:98.3 F (36.8 C)  Recent Labs  Lab 04/28/17 1444 04/28/17 1501 04/28/17 2027  WBC 13.4*  --   --   CREATININE 1.56*  --   --   LATICACIDVEN  --  0.98 1.39    Estimated Creatinine Clearance: 69.5 mL/min (A) (by C-G formula based on SCr of 1.56 mg/dL (H)).    Allergies  Allergen Reactions  . Sulfa Antibiotics Itching    Antimicrobials this admission: 1/21 Ceftriaxone x 1 1/21 Vancomycin >> 1/21 Cefepime >>  Dose adjustments this admission: n/a  Microbiology results: Pending  Thank you for allowing pharmacy to be a part of this patient's care.  Deboraha Sprang 04/28/2017 9:30 PM

## 2017-04-28 NOTE — H&P (Addendum)
History and Physical    Keith [name] WNU:[id number] DOB: Jul 22, 1965 DOA: 04/28/2017  PCP: Scot Jun, FNP Patient coming from: Home  I have personally briefly reviewed patient's old medical records in [place]  Chief Complaint: Foot pain  HPI: Keith [name] is a 52 y.o. male with medical history significant of  HIV on ART (last CD4 210 with an undetectable viral load), type 2 diabetes mellitus, hypertension, CKD stage III and osteomyelitis of the right first metatarsal on chronic therapy with Augmentin who presents to the ED with worsening distal plantar wound infection with underlying osteomyelitis despite antibiotic therapy. Patient endorses fever and chills over the last few days, however he is unaware of his maximum temperature prior to presentation. He endorses compliance with his antibiotic therapy. He also states that he has been wearing a sock at all times and using his walking boot for pressure offloading. He has been following with Orthopedic Surgery and Infectious Disease in the outpatient setting. Prior cultures of the foot were positive for E faecalis and MSSA. He has also had MRSA positivity in the past. He endorses adherence with HAART. He denies any other infectious symptoms such as cough or urinary changes. He reports that he has been keeping up with his wound changes.  ED Course: Patient afebrile, mildly tachycardic in the 90s and low 100s, mildly hypertensive to the 150s and centering comfortably on room air. Labs notable for leukocytosis with WBC 13.4, Hgb 9.3, platelets 363, BUN 14, creatinine 1.56. Total protein 8.4, albumin 3.0. X-ray of the right foot of his radicular chronic osteomyelitis of the first metatarsal.  Review of Systems: As per HPI otherwise 10 point review of systems negative.   Past Medical History:  Diagnosis Date  . AIDS ([place])   . Chronic knee pain    right  . Chronic pain   . Diabetes mellitus   . Diabetes type 2, uncontrolled ([place])     HgA1c 17.6 (04/27/2010)  . Diabetic foot ulcer ([place]) 01/2017   right foot  . Erectile dysfunction   . Genital warts   . HIV (human immunodeficiency virus infection) ([place]) 2009   CD4 count 100, VL 13800 (05/01/2010)  . Hypertension   . Osteomyelitis [facility])    h/o hand    Past Surgical History:  Procedure Laterality Date  . HERNIA REPAIR    . I&D EXTREMITY Left 08/21/2014   Procedure: INCISION AND DRAINAGE LEFT SMALL FINGER;  Surgeon: Leanora Cover, MD;  Location: [place];  Service: Orthopedics;  Laterality: Left;  . I&D EXTREMITY Right 03/18/2017   Procedure: IRRIGATION AND DEBRIDEMENT EXTREMITY;  Surgeon: [name], MD;  Location: [place];  Service: Orthopedics;  Laterality: Right;  . MINOR IRRIGATION AND DEBRIDEMENT OF WOUND Right 04/22/2014   Procedure: IRRIGATION AND DEBRIDEMENT OF RIGHT NECK ABCESS;  Surgeon: Jerrell Belfast, MD;  Location: [place];  Service: ENT;  Laterality: Right;  . MULTIPLE EXTRACTIONS WITH ALVEOLOPLASTY N/A 01/18/2013   Procedure: MULTIPLE EXTRACION 3, 6, 7, 10, 11, 13, 21, 22, 27, 28, 29, 30 WITH ALVEOLOPLASTY;  Surgeon: Gae Bon, DDS;  Location: [place];  Service: Oral Surgery;  Laterality: N/A;  . SKIN SPLIT GRAFT Right 03/21/2017   Procedure: IRRIGATION AND DEBRIDEMENT RIGHT FOOT AND APPLY SPLIT THICKNESS SKIN GRAFT AND WOUND VAC;  Surgeon: [name], MD;  Location: [place];  Service: Orthopedics;  Laterality: Right;     reports that  has never smoked. he has never used smokeless tobacco. He  reports that he does not drink alcohol or use drugs.  Allergies  Allergen Reactions  . Sulfa Antibiotics Itching    Family History  Problem Relation Age of Onset  . Hypertension Mother   . Arthritis Father   . Hypertension Father   . Hypertension Brother   . Cancer Maternal Grandmother 78       unknown type of cancer  . Depression Paternal Grandmother     Prior to Admission medications   Medication Sig Start Date End Date Taking? Authorizing  Provider  abacavir-dolutegravir-lamiVUDine (TRIUMEQ) 600-50-300 MG tablet Take 1 tablet by mouth daily. Patient taking differently: Take 1 tablet by mouth at bedtime.  01/20/17  Yes [name], MD  amLODipine (NORVASC) 10 MG tablet Take 1 tablet (10 mg total) by mouth daily. Patient taking differently: Take 10 mg by mouth at bedtime.  01/17/17  Yes Nita Sells, MD  amoxicillin-clavulanate (AUGMENTIN) 875-125 MG tablet Take 1 tablet by mouth 2 (two) times daily. Patient taking differently: Take 1 tablet by mouth 2 (two) times daily. continuous 04/15/17  Yes [name], MD  cyclobenzaprine (FLEXERIL) 10 MG tablet Take 1 tablet (10 mg total) by mouth 2 (two) times daily as needed for muscle spasms. 10/25/16  Yes Neese, [place], NP  darunavir-cobicistat (PREZCOBIX) 800-150 MG tablet Take 1 tablet by mouth daily. Swallow whole. Do NOT crush, break or chew tablets. Take with food. 01/20/17  Yes [name], MD  glimepiride (AMARYL) 1 MG tablet Take 1 tablet (1 mg total) by mouth every morning. Patient taking differently: Take 1 mg by mouth daily.  01/16/17 01/16/18 Yes Nita Sells, MD  Insulin Glargine (LANTUS SOLOSTAR) 100 UNIT/ML Solostar Pen Inject 20 Units into the skin daily at 10 pm. 01/16/17  Yes Nita Sells, MD  lisinopril (PRINIVIL,ZESTRIL) 5 MG tablet Take 5 mg by mouth daily. 04/23/17  Yes [provider]  LORazepam (ATIVAN) 1 MG tablet Take 1 tablet (1 mg total) by mouth every 6 (six) hours as needed for anxiety. Patient taking differently: Take 1 mg by mouth 3 (three) times daily.  03/24/17  Yes Alma Friendly, MD  naloxone [facility]) nasal spray 4 mg/0.1 mL Place 1 spray into the nose daily as needed (opioid overdose).   Yes [provider]  Oxycodone HCl 10 MG TABS Take 10 mg by mouth 3 (three) times daily. 04/22/17  Yes [provider]  oxyCODONE-acetaminophen (PERCOCET) 10-325 MG tablet Take 1 tablet by mouth every  8 (eight) hours as needed (breakthrough pain).  04/16/17  Yes [provider]  silver sulfADIAZINE (SILVADENE) 1 % cream Apply 1 application topically daily. 04/24/17  Yes Dondra Prader R, NP  Insulin Syringes, Disposable, U-100 0.3 ML MISC For administration of insulin TID. ICD E-11.9 10/29/16   Scot Jun, FNP  LORazepam (ATIVAN) 1 MG tablet Take 1 tablet (1 mg total) by mouth every 6 (six) hours as needed for anxiety. Patient not taking: Reported on 04/28/2017 01/30/15   [name], MD  oxyCODONE-acetaminophen (PERCOCET/ROXICET) 5-325 MG tablet Take 1 tablet by mouth every 6 (six) hours as needed for moderate pain or severe pain. Patient not taking: Reported on 04/28/2017 04/24/17   Suzan Slick, NP    Physical Exam: Vitals:   04/28/17 1946 04/28/17 2015 04/28/17 2030 04/28/17 2219  BP: 121/86 129/82 (!) 145/78 (!) 159/87  Pulse: (!) 110 (!) 113 (!) 110 (!) 116  Resp: 18   20  Temp:    99.1 F (37.3  C)  TempSrc:    Oral  SpO2: 99% 99% 98% 100%  Weight:      Height:        Constitutional: NAD, calm, comfortable Vitals:   04/28/17 1946 04/28/17 2015 04/28/17 2030 04/28/17 2219  BP: 121/86 129/82 (!) 145/78 (!) 159/87  Pulse: (!) 110 (!) 113 (!) 110 (!) 116  Resp: 18   20  Temp:    99.1 F (37.3 C)  TempSrc:    Oral  SpO2: 99% 99% 98% 100%  Weight:      Height:       Eyes: PERRL, lids and conjunctivae normal ENMT: Mucous membranes are moist. Posterior pharynx clear of any exudate or lesions.  Neck: normal, supple, no masses Respiratory: clear to auscultation bilaterally, no wheezing, no crackles. Normal respiratory effort. No accessory muscle use.  Cardiovascular: Tachycardic, no murmurs / rubs / gallops. 1+ edema of RLE. 1+ pedal pulses. Abdomen: no tenderness, no masses palpated. Bowel sounds positive.  Genitourinary: normal appearance of genitalia, orange-colored urine noted. Musculoskeletal: no clubbing / cyanosis. No joint deformity upper and lower  extremities. Good ROM, no contractures. Skin: Wound on plantar surface of right foot, erythema and rubor of right foot Neurologic: CN 2-12 grossly intact. Sensation intact, DTR normal. Strength 5/5 in all 4.  Psychiatric: Normal judgment and insight. Alert and oriented x 3. Normal mood.   Labs on Admission: I have personally reviewed following labs and imaging studies  CBC: Recent Labs  Lab 04/28/17 1444  WBC 13.4*  NEUTROABS 10.3*  HGB 9.3*  HCT 28.1*  MCV 85.7  PLT 176   Basic Metabolic Panel: Recent Labs  Lab 04/28/17 1444  NA 135  K 3.8  CL 101  CO2 23  GLUCOSE 173*  BUN 14  CREATININE 1.56*  CALCIUM 9.2   GFR: Estimated Creatinine Clearance: 69.5 mL/min (A) (by C-G formula based on SCr of 1.56 mg/dL (H)). Liver Function Tests: Recent Labs  Lab 04/28/17 1444  AST 20  ALT 18  ALKPHOS 121  BILITOT 0.8  PROT 8.4*  ALBUMIN 3.0*   No results for input(s): LIPASE, AMYLASE in the last 168 hours. No results for input(s): AMMONIA in the last 168 hours. Coagulation Profile: No results for input(s): INR, PROTIME in the last 168 hours. Cardiac Enzymes: No results for input(s): CKTOTAL, CKMB, CKMBINDEX, TROPONINI in the last 168 hours. BNP (last 3 results) No results for input(s): PROBNP in the last 8760 hours. HbA1C: No results for input(s): HGBA1C in the last 72 hours. CBG: No results for input(s): GLUCAP in the last 168 hours. Lipid Profile: No results for input(s): CHOL, HDL, LDLCALC, TRIG, CHOLHDL, LDLDIRECT in the last 72 hours. Thyroid Function Tests: No results for input(s): TSH, T4TOTAL, FREET4, T3FREE, THYROIDAB in the last 72 hours. Anemia Panel: No results for input(s): VITAMINB12, FOLATE, FERRITIN, TIBC, IRON, RETICCTPCT in the last 72 hours. Urine analysis:    Component Value Date/Time   COLORURINE YELLOW 11/18/2016 1429   APPEARANCEUR CLOUDY (A) 11/18/2016 1429   LABSPEC >=1.030 01/01/2017 1032   PHURINE 5.5 01/01/2017 1032   GLUCOSEU 250 (A)  01/01/2017 1032   HGBUR SMALL (A) 01/01/2017 1032   BILIRUBINUR NEGATIVE 01/01/2017 1032   KETONESUR NEGATIVE 01/01/2017 1032   PROTEINUR 100 (A) 01/01/2017 1032   UROBILINOGEN 1.0 01/01/2017 1032   NITRITE NEGATIVE 01/01/2017 1032   LEUKOCYTESUR NEGATIVE 01/01/2017 1032    Radiological Exams on Admission: Dg Foot Complete Right  Result Date: 04/28/2017 CLINICAL DATA:  C/o pain to  right foot. Best obtainable images due to patients pain. Hx diabetes, htn EXAM: RIGHT FOOT COMPLETE - 3+ VIEW COMPARISON:  03/14/2017 FINDINGS: Progressive patchy demineralization in the head of the first metatarsal. Otherwise normal mineralization. Great toe sesamoids are no longer visible possibly surgically resected. There is an associated plantar skin defect. Stap if le projects over the base of the fifth metatarsal. Normal alignment. IMPRESSION: 1. Progressive demineralization in the first metatarsal head suggesting osteomyelitis. Electronically Signed   By: Lucrezia Europe M.D.   On: 04/28/2017 19:10    Assessment/Plan Active Problems:   Osteomyelitis of metatarsal (HCC)  Acute on chronic osteomyelitis of right first metatarsal with overlying cellulitis of right foot Pt reports fever prior to presentation with leukocytosis and tachycardia present on admission. On my exam, he had shaking chills. XR consistent with osteomyelitis. Wound has worsened appearance from prior images in the chart. -Broad spectrum antibiotics: vanc, cefepime; Pharmacy managed -Obtain blood cultures -Check ESR, CRP -Orthopedic Surgery consult in AM -NPO at midnight for possible debridement versus amputation -Pain control -Wound care per Nursing QShift; wet to dry initially -Trend fever curve, daily CBC  HIV on ART -Check CD4 as it has not been checked in nearly 1 year, as well as HIV RNA -Continue Triumeq, Prezcobix  CKD -Renal function at baseline -Strict I/O, daily weights -Avoid nephrotoxins -Dose meds for CrCl <60  Urine  discoloration Unclear etiology. Pt not on rifampin. Perhaps related to ART, although I have not previously seen urine color change with these medications. -Check U/A for hematuria  Protein gap Protein gap of 5.4 present is likely secondary to HIV infection. Can consider further workup for paraproteinemia in the outpatient setting.  DM2 -Continue Lantus 20 U nightly -SSI Q4H while NPO  DVT prophylaxis: Lovenox Code Status: Full Disposition Plan: Home vs SNF pending possible ampuitation Consults called: None Admission status: Inpatient   Bennie Pierini MD [facility]  If 7PM-7AM, please contact night-coverage [email or web address] Password [facility]  04/28/2017, 11:39 PM

## 2017-04-28 NOTE — ED Notes (Signed)
ED Provider at bedside. 

## 2017-04-29 ENCOUNTER — Ambulatory Visit: Payer: Medicare Other | Admitting: Infectious Diseases

## 2017-04-29 DIAGNOSIS — A419 Sepsis, unspecified organism: Secondary | ICD-10-CM

## 2017-04-29 LAB — URINALYSIS, ROUTINE W REFLEX MICROSCOPIC
Bilirubin Urine: NEGATIVE
Glucose, UA: NEGATIVE mg/dL
Ketones, ur: NEGATIVE mg/dL
Leukocytes, UA: NEGATIVE
Nitrite: NEGATIVE
Protein, ur: 100 mg/dL — AB
Specific Gravity, Urine: 1.018 (ref 1.005–1.030)
pH: 5 (ref 5.0–8.0)

## 2017-04-29 LAB — BASIC METABOLIC PANEL
Anion gap: 13 (ref 5–15)
BUN: 13 mg/dL (ref 6–20)
CO2: 21 mmol/L — ABNORMAL LOW (ref 22–32)
Calcium: 8.9 mg/dL (ref 8.9–10.3)
Chloride: 100 mmol/L — ABNORMAL LOW (ref 101–111)
Creatinine, Ser: 1.47 mg/dL — ABNORMAL HIGH (ref 0.61–1.24)
GFR calc Af Amer: >60 mL/min (ref >60)
GFR calc non Af Amer: 53 mL/min — ABNORMAL LOW (ref >60)
Glucose, Bld: 120 mg/dL — ABNORMAL HIGH (ref 65–99)
Potassium: 3.6 mmol/L (ref 3.5–5.1)
Sodium: 134 mmol/L — ABNORMAL LOW (ref 135–145)

## 2017-04-29 LAB — BLOOD CULTURE ID PANEL (REFLEXED)
Acinetobacter baumannii: NOT DETECTED
Candida albicans: NOT DETECTED
Candida glabrata: NOT DETECTED
Candida krusei: NOT DETECTED
Candida parapsilosis: NOT DETECTED
Candida tropicalis: NOT DETECTED
Carbapenem resistance: NOT DETECTED
Enterobacter cloacae complex: NOT DETECTED
Enterococcus species: NOT DETECTED
Escherichia coli: NOT DETECTED
Haemophilus influenzae: NOT DETECTED
Klebsiella oxytoca: NOT DETECTED
Klebsiella pneumoniae: NOT DETECTED
Listeria monocytogenes: NOT DETECTED
Methicillin resistance: DETECTED — AB
Neisseria meningitidis: NOT DETECTED
Pseudomonas aeruginosa: NOT DETECTED
Serratia marcescens: NOT DETECTED
Staphylococcus aureus (BCID): DETECTED — AB
Staphylococcus species: DETECTED — AB
Streptococcus agalactiae: NOT DETECTED
Streptococcus pneumoniae: NOT DETECTED
Streptococcus pyogenes: NOT DETECTED
Streptococcus species: NOT DETECTED

## 2017-04-29 LAB — CBC WITH DIFFERENTIAL/PLATELET
Basophils Absolute: 0 10*3/uL (ref 0.0–0.1)
Basophils Relative: 0 %
Eosinophils Absolute: 0.1 10*3/uL (ref 0.0–0.7)
Eosinophils Relative: 1 %
HCT: 28 % — ABNORMAL LOW (ref 39.0–52.0)
Hemoglobin: 8.9 g/dL — ABNORMAL LOW (ref 13.0–17.0)
Lymphocytes Relative: 11 %
Lymphs Abs: 1.3 10*3/uL (ref 0.7–4.0)
MCH: 27.2 pg (ref 26.0–34.0)
MCHC: 31.8 g/dL (ref 30.0–36.0)
MCV: 85.6 fL (ref 78.0–100.0)
Monocytes Absolute: 1.4 10*3/uL — ABNORMAL HIGH (ref 0.1–1.0)
Monocytes Relative: 12 %
Neutro Abs: 9.1 10*3/uL — ABNORMAL HIGH (ref 1.7–7.7)
Neutrophils Relative %: 76 %
Platelets: 342 10*3/uL (ref 150–400)
RBC: 3.27 MIL/uL — ABNORMAL LOW (ref 4.22–5.81)
RDW: 14.4 % (ref 11.5–15.5)
WBC: 11.9 10*3/uL — ABNORMAL HIGH (ref 4.0–10.5)

## 2017-04-29 LAB — APTT: aPTT: 33 seconds (ref 24–36)

## 2017-04-29 LAB — GLUCOSE, CAPILLARY
Glucose-Capillary: 157 mg/dL — ABNORMAL HIGH (ref 65–99)
Glucose-Capillary: 124 mg/dL — ABNORMAL HIGH (ref 65–99)
Glucose-Capillary: 126 mg/dL — ABNORMAL HIGH (ref 65–99)
Glucose-Capillary: 131 mg/dL — ABNORMAL HIGH (ref 65–99)
Glucose-Capillary: 118 mg/dL — ABNORMAL HIGH (ref 65–99)
Glucose-Capillary: 107 mg/dL — ABNORMAL HIGH (ref 65–99)

## 2017-04-29 LAB — LACTIC ACID, PLASMA: Lactic Acid, Venous: 0.6 mmol/L (ref 0.5–1.9)

## 2017-04-29 LAB — MRSA PCR SCREENING: MRSA by PCR: POSITIVE — AB

## 2017-04-29 LAB — PHOSPHORUS: Phosphorus: 3.1 mg/dL (ref 2.5–4.6)

## 2017-04-29 LAB — MAGNESIUM: Magnesium: 1.7 mg/dL (ref 1.7–2.4)

## 2017-04-29 LAB — SEDIMENTATION RATE: Sed Rate: 140 mm/hr — ABNORMAL HIGH (ref 0–16)

## 2017-04-29 LAB — PROTIME-INR
INR: 1.16
Prothrombin Time: 14.7 seconds (ref 11.4–15.2)

## 2017-04-29 LAB — C-REACTIVE PROTEIN: CRP: 14.9 mg/dL — ABNORMAL HIGH (ref <1.0)

## 2017-04-29 MED ORDER — ACETAMINOPHEN 325 MG PO TABS: 650.0000 mg | ORAL_TABLET | Freq: Four times a day (QID) | ORAL | Status: DC | PRN

## 2017-04-29 MED ORDER — ACETAMINOPHEN 650 MG RE SUPP: 650.0000 mg | Freq: Four times a day (QID) | RECTAL | Status: DC | PRN

## 2017-04-29 MED ORDER — ACETAMINOPHEN 650 MG RE SUPP: 650.0000 mg | Freq: Three times a day (TID) | RECTAL | Status: DC | PRN

## 2017-04-29 MED ORDER — ENOXAPARIN SODIUM 60 MG/0.6ML ~~LOC~~ SOLN
55.0000 mg | Freq: Every day | SUBCUTANEOUS | Status: DC
  Administered 2017-04-29 – 2017-05-06 (×6): 55 mg via SUBCUTANEOUS
  Filled 2017-04-29 (×6): qty 0.6

## 2017-04-29 MED ORDER — ACETAMINOPHEN 325 MG PO TABS
650.0000 mg | ORAL_TABLET | Freq: Four times a day (QID) | ORAL | Status: DC | PRN
  Administered 2017-04-29 – 2017-04-30 (×3): 650 mg via ORAL
  Filled 2017-04-29 (×3): qty 2

## 2017-04-29 MED ORDER — PIPERACILLIN-TAZOBACTAM 3.375 G IVPB
3.3750 g | Freq: Three times a day (TID) | INTRAVENOUS | Status: DC
  Filled 2017-04-29: qty 50

## 2017-04-29 MED ORDER — INSULIN ASPART 100 UNIT/ML ~~LOC~~ SOLN
0.0000 [IU] | Freq: Three times a day (TID) | SUBCUTANEOUS | Status: DC
  Administered 2017-04-30: 3 [IU] via SUBCUTANEOUS
  Administered 2017-05-02: 2 [IU] via SUBCUTANEOUS
  Administered 2017-05-03: 1 [IU] via SUBCUTANEOUS
  Administered 2017-05-03: 3 [IU] via SUBCUTANEOUS
  Administered 2017-05-04: 2 [IU] via SUBCUTANEOUS
  Administered 2017-05-05: 3 [IU] via SUBCUTANEOUS
  Administered 2017-05-06: 8 [IU] via SUBCUTANEOUS

## 2017-04-29 MED ORDER — PIPERACILLIN-TAZOBACTAM 3.375 G IVPB
3.3750 g | Freq: Three times a day (TID) | INTRAVENOUS | Status: DC
  Administered 2017-04-29: 3.375 g via INTRAVENOUS
  Filled 2017-04-29 (×3): qty 50

## 2017-04-29 MED ORDER — INSULIN ASPART 100 UNIT/ML ~~LOC~~ SOLN: 0.0000 [IU] | Freq: Every day | SUBCUTANEOUS | Status: DC

## 2017-04-29 NOTE — Progress Notes (Signed)
PHARMACY - PHYSICIAN COMMUNICATION CRITICAL VALUE ALERT - BLOOD CULTURE IDENTIFICATION (BCID)  Results for orders placed or performed during the hospital encounter of 04/28/17  Blood Culture ID Panel (Reflexed) (Collected: 04/29/2017 12:08 AM)  Result Value Ref Range   Enterococcus species NOT DETECTED NOT DETECTED   Listeria monocytogenes NOT DETECTED NOT DETECTED   Staphylococcus species DETECTED (A) NOT DETECTED   Staphylococcus aureus DETECTED (A) NOT DETECTED   Methicillin resistance DETECTED (A) NOT DETECTED   Streptococcus species NOT DETECTED NOT DETECTED   Streptococcus agalactiae NOT DETECTED NOT DETECTED   Streptococcus pneumoniae NOT DETECTED NOT DETECTED   Streptococcus pyogenes NOT DETECTED NOT DETECTED   Acinetobacter baumannii NOT DETECTED NOT DETECTED   Enterobacter cloacae complex NOT DETECTED NOT DETECTED   Escherichia coli NOT DETECTED NOT DETECTED   Klebsiella oxytoca NOT DETECTED NOT DETECTED   Klebsiella pneumoniae NOT DETECTED NOT DETECTED   Serratia marcescens NOT DETECTED NOT DETECTED   Carbapenem resistance NOT DETECTED NOT DETECTED   Haemophilus influenzae NOT DETECTED NOT DETECTED   Neisseria meningitidis NOT DETECTED NOT DETECTED   Pseudomonas aeruginosa NOT DETECTED NOT DETECTED   Candida albicans NOT DETECTED NOT DETECTED   Candida glabrata NOT DETECTED NOT DETECTED   Candida krusei NOT DETECTED NOT DETECTED   Candida parapsilosis NOT DETECTED NOT DETECTED   Candida tropicalis NOT DETECTED NOT DETECTED   Pt with MRSA in 2/2 blood cultures.  Name of physician (or Provider) Contacted: Auburn Bilberry  Changes to prescribed antibiotics required: None  Keith [name] 04/29/2017  6:51 PM

## 2017-04-29 NOTE — Progress Notes (Signed)
ANTIBIOTIC CONSULT NOTE - INITIAL  Pharmacy Consult for Zosyn Indication: osteo  Allergies  Allergen Reactions  . Sulfa Antibiotics Itching    Patient Measurements: Height: 5\' 9"  (175.3 cm) Weight: 245 lb 13 oz (111.5 kg) IBW/kg (Calculated) : 70.7 Adjusted Body Weight:   Vital Signs: Temp: 98.5 F (36.9 C) (01/22 1353) Temp Source: Oral (01/22 1353) BP: 109/67 (01/22 1353) Pulse Rate: 82 (01/22 1353) Intake/Output from previous day: 01/21 0701 - 01/22 0700 In: 50 [IV Piggyback:50] Out: -  Intake/Output from this shift: No intake/output data recorded.  Labs: Recent Labs    04/28/17 1444 04/29/17 0640 04/29/17 0644  WBC 13.4*  --  11.9*  HGB 9.3*  --  8.9*  PLT 363  --  342  CREATININE 1.56* 1.47*  --    Estimated Creatinine Clearance: 72.3 mL/min (A) (by C-G formula based on SCr of 1.47 mg/dL (H)). No results for input(s): VANCOTROUGH, VANCOPEAK, VANCORANDOM, GENTTROUGH, GENTPEAK, GENTRANDOM, TOBRATROUGH, TOBRAPEAK, TOBRARND, AMIKACINPEAK, AMIKACINTROU, AMIKACIN in the last 72 hours.   Microbiology: Recent Results (from the past 720 hour(s))  MRSA PCR Screening     Status: Abnormal   Collection Time: 04/29/17  4:38 AM  Result Value Ref Range Status   MRSA by PCR POSITIVE (A) NEGATIVE Final    Comment:        The GeneXpert MRSA Assay (FDA approved for NASAL specimens only), is one component of a comprehensive MRSA colonization surveillance program. It is not intended to diagnose MRSA infection nor to guide or monitor treatment for MRSA infections. RESULT CALLED TO, READ BACK BY AND VERIFIED WITH: RN R Arlester Marker [id number]     Medical History: Past Medical History:  Diagnosis Date  . AIDS ([place])   . Chronic knee pain    right  . Chronic pain   . Diabetes mellitus   . Diabetes type 2, uncontrolled ([place])    HgA1c 17.6 (04/27/2010)  . Diabetic foot ulcer ([place]) 01/2017   right foot  . Erectile dysfunction   . Genital warts   . HIV (human  immunodeficiency virus infection) ([place]) 2009   CD4 count 100, VL 13800 (05/01/2010)  . Hypertension   . Osteomyelitis ([place])    h/o hand    Assessment: ID: D#[id number] for right foot osteo, hx hand osteo - Tmax 102.7, WBC 13.4, LA 1.39, CRP 14.9. Wound has worsened appearance from prior images per MD note. change cefepime to zosyn for improved anaerobic coverage - HIV on Triumeq, Prezcobix  Vanc 1/21 >> Cefepime 1/21 >>1/22 Zosyn 1/22>> Augmentin PTA  1/22 MRSA PCR - positive 1/22 BCx -  1/22 foot wound fx (superficial) -   Goal of Therapy:  Eradication of infection  Plan:  Zosyn 3.375g IV q 8hr.  Alis Sawchuk S. Alford Highland, PharmD, BCPS Clinical Staff Pharmacist Pager [phone]  Eilene Ghazi Stillinger 04/29/2017,3:03 PM

## 2017-04-29 NOTE — Progress Notes (Addendum)
      INFECTIOUS DISEASE ATTENDING ADDENDUM:   Date: 04/29/2017  Patient name: Keith [name]  Medical record number: [id number]  Date of birth: 11-29-1965        [facility] Antimicrobial Management Team Staphylococcus aureus bacteremia   Staphylococcus aureus bacteremia (SAB) is associated with a high rate of complications and mortality.  Specific aspects of clinical management are critical to optimizing the outcome of patients with SAB.  Therefore, the [facility] Antimicrobial Management Team [facility]) has initiated an intervention aimed at improving the management of SAB at [facility].  To do so, Infectious Diseases physicians are providing an evidence-based consult for the management of all patients with SAB.     Yes No Comments  Perform follow-up blood cultures (even if the patient is afebrile) to ensure clearance of bacteremia [x]  []    Remove vascular catheter and obtain follow-up blood cultures after the removal of the catheter [x]  []    Perform echocardiography to evaluate for endocarditis (transthoracic ECHO is 40-50% sensitive, TEE is > 90% sensitive) [x]  []  Please keep in mind, that neither test can definitively EXCLUDE endocarditis, and that should clinical suspicion remain high for endocarditis the patient should then still be treated with an "endocarditis" duration of therapy = 6 weeks  Consult electrophysiologist to evaluate implanted cardiac device (pacemaker, ICD) []  []    Ensure source control []  []  Have all abscesses been drained effectively? Have deep seeded infections (septic joints or osteomyelitis) had appropriate surgical debridement?  Chronic osteomyelitis of toe seems to be likely source  ORTHOPEDICS NEEDS TO BE CONSULTED TOMORROW  I WOULD ALSO CONSIDER MRI. PLAIN FILMS ARE NOT ENCOURAGING AT THIS POINT    Investigate for "metastatic" sites of infection [x]  []  Does the patient have ANY symptom or physical exam finding that would suggest a deeper infection (back  or neck pain that may be suggestive of vertebral osteomyelitis or epidural abscess, muscle pain that could be a symptom of pyomyositis)?  Keep in mind that for deep seeded infections MRI imaging with contrast is preferred rather than other often insensitive tests such as plain x-rays, especially early in a patient's presentation.  Change antibiotic therapy to vancomycin []  []  Beta-lactam antibiotics are preferred for MSSA due to higher cure rates.   If on Vancomycin, goal trough should be 15 - 20 mcg/mL  Estimated duration of IV antibiotic therapy:  4-8 weeks depending on type of surgery on toe, endocarditis workup and discovery of other metastatic sites of infection [x]  []  Consult case management for probably prolonged outpatient IV antibiotic therapy   NO NEED FOR ZOSYN HERE AT ALL  I WILL ASK DR. Linus Salmons TO SEE HIM TOMORROW.  NOTE with re to his osteomyelitis he has grown MSSA, GBS on 12/8, AMP S Enterococcus and non Albicans Candida on 03/18/17  I will hold off adding an echinocandin for the candida species for now  IF PATIENT GOES FOR FURTHER SURGERY WE CAN SEND FOR AEROBIC/ANEROBIC AND FUNGAL CULTURES AND THIS TIME SEND CANDIDA ORGANISM FOR ID AND SENSIS AND THEN TREAT IF NECESSARY (IE IF CONCERN FOR MARGINS OF AMPUTATION SITE STILL INVOLVED). THERE IS NO URGENT NEED FOR IT AT THIS POINT.       Alcide Evener 04/29/2017, 8:22 PM

## 2017-04-29 NOTE — Progress Notes (Signed)
PROGRESS NOTE  Keith [name]  MBE:[id number] DOB: [birth year] DOA: 04/28/2017 PCP: Scot Jun, FNP   Brief Narrative: Keith [name] is a 52 y.o. male with a history of HIV on ART (last CD4 210, undetectable VL), T2DM, HTN, stage III CKD, and chronic right foot osteomyelitis who presented to the ED 1/21 with worsening distal plantar pain and fever/chills concerning for worsening wound infection/osteomyelitis despite outpatient augmentin. He was tachycardic, afebrile with leukocytosis  (WBC 13.4) and XR demonstrating progressive demineralization in the first metatarsal head suggesting osteomyelitis. CRP found to be 14.9, ESR 140, lactic acid reassuringly normal. Broad IV abx started and [name] contacted for orthopedic consultation.   Assessment & Plan: Active Problems:   Osteomyelitis of metatarsal (HCC)  Sepsis due to acute on chronic osteomyelitis of right first metatarsal with overlying cellulitis of right foot: XR consistent with osteomyelitis. Wound has worsened appearance from prior images in the chart. ESR 140, CRP 14.9. Has been followed closely by [name]. - Continue vancomycin, will change cefepime to zosyn for improved anaerobic coverage - Monitor blood cultures drawn at admission.  - Discussed with [name] who will be able to evaluate the patient 1/24. Will start diet, and make NPO after MN on 1/24.  - Pain control - W > D dressing changes.   HIV on ART:  - CD4, VL pending - Continue Triumeq, Prezcobix  Stage III CKD: Cr appears stable to baseline.  - Monitor BMP, UOP - Renally dose medications, caution with vanc/zosyn.   Hematuria: Uncertain etiology. Hgb near baseline.  - Defer further evaluation to outpatient setting.   Protein gap: Protein gap of 5.4 present is likely secondary to HIV infection.  - Can consider further workup for paraproteinemia in the outpatient setting.  IDT2DM:  - Continue lantus 20u qHS, SSI qAC/HS  DVT prophylaxis: Lovenox Code  Status: Full Family Communication: None at bedside Disposition Plan: Uncertain  Consultants:   Orthopedic surgery, [name]  Procedures:   None  Antimicrobials:  Vancomycin 1/21 >>   Cefepime 1/21 - 1/22  Zosyn 1/22 >>    Subjective: Hungry and in pain. Had fever to 102.69F overnight. No cough, dyspnea, chest pain, dysuria or sick contacts.    Objective: Vitals:   04/29/17 0500 04/29/17 0603 04/29/17 0900 04/29/17 1353  BP:  (!) 142/82 (!) 105/53 109/67  Pulse:  (!) 104 88 82  Resp:  _0 Temp:  (!) 102.7 F (39.3 C) (!) 100.5 F (38.1 C) 98.5 F (36.9 C)  TempSrc:  Oral Oral Oral  SpO2:  100% 100% 98%  Weight: 111.5 kg (245 lb 13 oz)     Height:        Intake/Output Summary (Last 24 hours) at 04/29/2017 1444 Last data filed at 04/28/2017 2145 Gross per 24 hour  Intake 50 ml  Output -  Net 50 ml   Filed Weights   04/28/17 1413 04/29/17 0500  Weight: 115.7 kg (255 lb) 111.5 kg (245 lb 13 oz)    Gen: 52 y.o. male in no distress resting quietly supine Pulm: Non-labored breathing room air. Clear to auscultation bilaterally.  CV: Regular rate and rhythm. No murmur, rub, or gallop. No JVD, no significant pedal edema. GI: Abdomen soft, non-tender, non-distended, with normoactive bowel sounds. No organomegaly or masses felt. Skin: Right foot with plantar wound, significant erythema surrounding with edema. DP pulses 1+ bilaterally, cap refill brisk.  Neuro: Alert and oriented. No focal neurological deficits. Psych: Judgement and insight  appear normal. Mood & affect appropriate.   Data Reviewed: I have personally reviewed following labs and imaging studies  CBC: Recent Labs  Lab 04/28/17 1444 04/29/17 0644  WBC 13.4* 11.9*  NEUTROABS 10.3* 9.1*  HGB 9.3* 8.9*  HCT 28.1* 28.0*  MCV 85.7 85.6  PLT 363 291   Basic Metabolic Panel: Recent Labs  Lab 04/28/17 1444 04/28/17 2347 04/29/17 0640  NA 135  --  134*  K 3.8  --  3.6  CL 101  --  100*  CO2  23  --  21*  GLUCOSE 173*  --  120*  BUN 14  --  13  CREATININE 1.56*  --  1.47*  CALCIUM 9.2  --  8.9  MG  --  1.7  --   PHOS  --  3.1  --    GFR: Estimated Creatinine Clearance: 72.3 mL/min (A) (by C-G formula based on SCr of 1.47 mg/dL (H)). Liver Function Tests: Recent Labs  Lab 04/28/17 1444  AST 20  ALT 18  ALKPHOS 121  BILITOT 0.8  PROT 8.4*  ALBUMIN 3.0*   No results for input(s): LIPASE, AMYLASE in the last 168 hours. No results for input(s): AMMONIA in the last 168 hours. Coagulation Profile: Recent Labs  Lab 04/28/17 2347  INR 1.16   Cardiac Enzymes: No results for input(s): CKTOTAL, CKMB, CKMBINDEX, TROPONINI in the last 168 hours. BNP (last 3 results) No results for input(s): PROBNP in the last 8760 hours. HbA1C: No results for input(s): HGBA1C in the last 72 hours. CBG: Recent Labs  Lab 04/28/17 2238 04/29/17 0452 04/29/17 0831 04/29/17 1139  GLUCAP 157* 124* 126* 131*   Lipid Profile: No results for input(s): CHOL, HDL, LDLCALC, TRIG, CHOLHDL, LDLDIRECT in the last 72 hours. Thyroid Function Tests: No results for input(s): TSH, T4TOTAL, FREET4, T3FREE, THYROIDAB in the last 72 hours. Anemia Panel: No results for input(s): VITAMINB12, FOLATE, FERRITIN, TIBC, IRON, RETICCTPCT in the last 72 hours. Urine analysis:    Component Value Date/Time   COLORURINE YELLOW 04/29/2017 0438   APPEARANCEUR HAZY (A) 04/29/2017 0438   LABSPEC 1.018 04/29/2017 0438   PHURINE 5.0 04/29/2017 0438   GLUCOSEU NEGATIVE 04/29/2017 0438   HGBUR SMALL (A) 04/29/2017 0438   BILIRUBINUR NEGATIVE 04/29/2017 0438   KETONESUR NEGATIVE 04/29/2017 0438   PROTEINUR 100 (A) 04/29/2017 0438   UROBILINOGEN 1.0 01/01/2017 1032   NITRITE NEGATIVE 04/29/2017 0438   LEUKOCYTESUR NEGATIVE 04/29/2017 0438   Recent Results (from the past 240 hour(s))  MRSA PCR Screening     Status: Abnormal   Collection Time: 04/29/17  4:38 AM  Result Value Ref Range Status   MRSA by PCR  POSITIVE (A) NEGATIVE Final    Comment:        The GeneXpert MRSA Assay (FDA approved for NASAL specimens only), is one component of a comprehensive MRSA colonization surveillance program. It is not intended to diagnose MRSA infection nor to guide or monitor treatment for MRSA infections. RESULT CALLED TO, READ BACK BY AND VERIFIED WITH: RN [place] [phone] [facility]       Radiology Studies: Dg Foot Complete Right  Result Date: 04/28/2017 CLINICAL DATA:  C/o pain to right foot. Best obtainable images due to patients pain. Hx diabetes, htn EXAM: RIGHT FOOT COMPLETE - 3+ VIEW COMPARISON:  03/14/2017 FINDINGS: Progressive patchy demineralization in the head of the first metatarsal. Otherwise normal mineralization. Great toe sesamoids are no longer visible possibly surgically resected. There is an associated plantar skin  defect. Stap if le projects over the base of the fifth metatarsal. Normal alignment. IMPRESSION: 1. Progressive demineralization in the first metatarsal head suggesting osteomyelitis. Electronically Signed   By: Lucrezia Europe M.D.   On: 04/28/2017 19:10    Scheduled Meds: . abacavir-dolutegravir-lamiVUDine  1 tablet Oral QHS  . darunavir-cobicistat  1 tablet Oral Q breakfast  . docusate sodium  100 mg Oral BID  . enoxaparin (LOVENOX) injection  55 mg Subcutaneous Daily  . insulin aspart  0-15 Units Subcutaneous Q4H  . insulin glargine  20 Units Subcutaneous Q2200  . polyethylene glycol  17 g Oral Daily  . senna  1 tablet Oral BID   Continuous Infusions: . ceFEPime (MAXIPIME) IV 1 g (04/29/17 1423)  . vancomycin Stopped (04/29/17 1107)     LOS: 1 day   Time spent: 25 minutes.  Vance Gather, MD [facility] Pager [phone]  If 7PM-7AM, please contact night-coverage [email or web address] Password [facility] 04/29/2017, 2:44 PM

## 2017-04-30 ENCOUNTER — Inpatient Hospital Stay (HOSPITAL_COMMUNITY): Payer: Medicare Other

## 2017-04-30 DIAGNOSIS — R7881 Bacteremia: Secondary | ICD-10-CM

## 2017-04-30 DIAGNOSIS — E1169 Type 2 diabetes mellitus with other specified complication: Secondary | ICD-10-CM

## 2017-04-30 DIAGNOSIS — E1122 Type 2 diabetes mellitus with diabetic chronic kidney disease: Secondary | ICD-10-CM

## 2017-04-30 DIAGNOSIS — M869 Osteomyelitis, unspecified: Secondary | ICD-10-CM

## 2017-04-30 DIAGNOSIS — I10 Essential (primary) hypertension: Secondary | ICD-10-CM

## 2017-04-30 DIAGNOSIS — E11621 Type 2 diabetes mellitus with foot ulcer: Secondary | ICD-10-CM

## 2017-04-30 DIAGNOSIS — Z21 Asymptomatic human immunodeficiency virus [HIV] infection status: Secondary | ICD-10-CM

## 2017-04-30 DIAGNOSIS — L97419 Non-pressure chronic ulcer of right heel and midfoot with unspecified severity: Secondary | ICD-10-CM

## 2017-04-30 LAB — ECHOCARDIOGRAM COMPLETE
E decel time: 211 msec
E/e' ratio: 6.92
FS: 27 % — AB (ref 28–44)
Height: 69 in
IVS/LV PW RATIO, ED: 0.82
LA ID, A-P, ES: 39 mm
LA diam end sys: 39 mm
LA diam index: 1.65 cm/m2
LA vol A4C: 47.3 ml
LA vol index: 21.6 mL/m2
LA vol: 51 mL
LV E/e' medial: 6.92
LV E/e'average: 6.92
LV PW d: 11 mm — AB (ref 0.6–1.1)
LV e' LATERAL: 10.8 cm/s
LVOT SV: 79 mL
LVOT VTI: 22.9 cm
LVOT area: 3.46 cm2
LVOT diameter: 21 mm
LVOT peak grad rest: 5 mmHg
LVOT peak vel: 114 cm/s
Lateral S' vel: 14.3 cm/s
MV Dec: 211
MV Peak grad: 2 mmHg
MV pk A vel: 85.4 m/s
MV pk E vel: 74.7 m/s
TAPSE: 19.1 mm
TDI e' lateral: 10.8
TDI e' medial: 7.29
Weight: 3890.68 oz

## 2017-04-30 LAB — CBC
HCT: 27.3 % — ABNORMAL LOW (ref 39.0–52.0)
Hemoglobin: 8.9 g/dL — ABNORMAL LOW (ref 13.0–17.0)
MCH: 27.6 pg (ref 26.0–34.0)
MCHC: 32.6 g/dL (ref 30.0–36.0)
MCV: 84.5 fL (ref 78.0–100.0)
Platelets: 389 10*3/uL (ref 150–400)
RBC: 3.23 MIL/uL — ABNORMAL LOW (ref 4.22–5.81)
RDW: 13.9 % (ref 11.5–15.5)
WBC: 13.9 10*3/uL — ABNORMAL HIGH (ref 4.0–10.5)

## 2017-04-30 LAB — BASIC METABOLIC PANEL
Anion gap: 14 (ref 5–15)
BUN: 13 mg/dL (ref 6–20)
CO2: 22 mmol/L (ref 22–32)
Calcium: 9.1 mg/dL (ref 8.9–10.3)
Chloride: 100 mmol/L — ABNORMAL LOW (ref 101–111)
Creatinine, Ser: 1.56 mg/dL — ABNORMAL HIGH (ref 0.61–1.24)
GFR calc Af Amer: 57 mL/min — ABNORMAL LOW (ref >60)
GFR calc non Af Amer: 49 mL/min — ABNORMAL LOW (ref >60)
Glucose, Bld: 95 mg/dL (ref 65–99)
Potassium: 3.5 mmol/L (ref 3.5–5.1)
Sodium: 136 mmol/L (ref 135–145)

## 2017-04-30 LAB — T-HELPER CELLS (CD4) COUNT (NOT AT ARMC)
CD4 % Helper T Cell: 6 % — ABNORMAL LOW (ref 33–55)
CD4 T Cell Abs: 80 /uL — ABNORMAL LOW (ref 400–2700)

## 2017-04-30 LAB — GLUCOSE, CAPILLARY
Glucose-Capillary: 89 mg/dL (ref 65–99)
Glucose-Capillary: 103 mg/dL — ABNORMAL HIGH (ref 65–99)
Glucose-Capillary: 155 mg/dL — ABNORMAL HIGH (ref 65–99)
Glucose-Capillary: 101 mg/dL — ABNORMAL HIGH (ref 65–99)

## 2017-04-30 LAB — HIV-1 RNA, PCR (GRAPH) RFX/GENO EDI
HIV-1 RNA BY PCR: <20 copies/mL
HIV-1 RNA Quant, Log: UNDETERMINED log10copy/mL

## 2017-04-30 MED ORDER — GADOBENATE DIMEGLUMINE 529 MG/ML IV SOLN
20.0000 mL | Freq: Once | INTRAVENOUS | Status: AC | PRN
  Administered 2017-04-30: 20 mL via INTRAVENOUS

## 2017-04-30 NOTE — Consult Note (Signed)
[place] [facility] for Infectious Disease   Reason for Consult: MRSA bacteremia     Referring Physician: Dr. Bonner Puna   Active Problems:   Osteomyelitis of metatarsal ([place])   . abacavir-dolutegravir-lamiVUDine  1 tablet Oral QHS  . darunavir-cobicistat  1 tablet Oral Q breakfast  . docusate sodium  100 mg Oral BID  . enoxaparin (LOVENOX) injection  55 mg Subcutaneous Daily  . insulin aspart  0-15 Units Subcutaneous TID WC  . insulin aspart  0-5 Units Subcutaneous QHS  . insulin glargine  20 Units Subcutaneous Q2200  . polyethylene glycol  17 g Oral Daily  . senna  1 tablet Oral BID    Recommendations: Continue IV vancomycin. Repeat cultures ordered for today. Obtain echocardiogram, will likely also need TEE if echo negative. Ortho to evaluate patient tomorrow for surgical debridement vs. Amputation.  MRI right foot ordered but not yet performed - not needed from an ID standpoint. May cancel unless needed for surgical planning. Assuming repeat cultures are negative and TTE/TEE are negative for vegetations, will plans for 6 weeks of IV vanc after source control.   Assessment: 52 yo M admitted with progressive chronic right first metatarsal osteomyelitis despite therapy with PO Augmentin as an outpatient, now with MRSA bacteremia.   Antibiotics:  1/21 IV Vanc >>   1/21 IV Cefepime >> dc'd 1/22 1/22 IV Zosyn x 1 dose >> dc'd 1/22  Cultures: 1/22 Blood Cx >> 1/2 with MRSA, 2/2 with staph aureus  1/22 Wound Cx >> pending  1/23 Blood Cx >> ordered  HPI: Keith [name] is a 52 y.o. male with a pmhx significant for HTN, CKD III, DM, HIV, and osteomyelitis of the right first metatarsal on chronic therapy with Augmentin who presented on 1/21 with worsening distal plantar wound infection and underlying osteomyelitis despite antibiotic therapy. Prior cultures were positive for E faecalis and MSSA. He also had prior MRSA positivity cultures in the past. Patient reports compliance with his  HAART. Last CD4 was 210 in Feb. Viral load was undetectable in October 2018. In the ED patient was afebrile and hemodynamically stable. Labs with WBC of 13.4. Renal function with BUN 14 and SCr 1.56 at baseline. ESR was 140 (127 one moth prior) and CRP 14.9 (23 one moth prior). Xray of his right foot demonstrated progressive demineralization in the first metatarsal head suggesting osteomyelitis. Orthopedic surgery was consulted. [name] to evaluate patient 1/24.   Review of Systems: Review of Systems  Constitutional: Negative for fever.  Respiratory: Negative for shortness of breath.   Cardiovascular: Negative for chest pain.  Gastrointestinal: Negative for abdominal pain.  Musculoskeletal: Negative for myalgias.  Skin: Negative for rash.  Neurological: Negative for dizziness.  Psychiatric/Behavioral: Negative for depression.    All other systems reviewed and are negative    Past Medical History:  Diagnosis Date  . AIDS ([place])   . Chronic knee pain    right  . Chronic pain   . Diabetes mellitus   . Diabetes type 2, uncontrolled ([place])    HgA1c 17.6 (04/27/2010)  . Diabetic foot ulcer ([place]) 01/2017   right foot  . Erectile dysfunction   . Genital warts   . HIV (human immunodeficiency virus infection) ([place]) 2009   CD4 count 100, VL 13800 (05/01/2010)  . Hypertension   . Osteomyelitis [facility])    h/o hand    Social History   Tobacco Use  . Smoking status: Never Smoker  . Smokeless tobacco: Never Used  Substance  Use Topics  . Alcohol use: No    Alcohol/week: 0.0 oz  . Drug use: No    Family History  Problem Relation Age of Onset  . Hypertension Mother   . Arthritis Father   . Hypertension Father   . Hypertension Brother   . Cancer Maternal Grandmother 38       unknown type of cancer  . Depression Paternal Grandmother     Allergies  Allergen Reactions  . Sulfa Antibiotics Itching    Physical Exam:  Vitals:   04/29/17 2050 04/30/17 0539  BP: 122/77 137/77    Pulse: 96 95  Resp: 18 18  Temp: (!) 101.6 F (38.7 C) 98.6 F (37 C)  SpO2: 98% 98%   Constitutional: NAD, appears comfortable HEENT: Atraumatic, normocephalic. PERRL, anicteric sclera.  Neck: Supple, trachea midline.  Cardiovascular: RRR, no murmurs, rubs, or gallops.  Pulmonary/Chest: CTAB, no wheezes, rales, or rhonchi.  Abdominal: Soft, non tender, non distended. +BS.  Extremities: Warm and well perfused. Right foot well wrapped with some serosanguinous drainage seeping through, painful to palpation.  Neurological: A&Ox3, CN II - XII grossly intact.  Skin: No rashes or erythema  Psychiatric: Normal mood and affect   Lab Results  Component Value Date   WBC 13.9 (H) 04/30/2017   HGB 8.9 (L) 04/30/2017   HCT 27.3 (L) 04/30/2017   MCV 84.5 04/30/2017   PLT 389 04/30/2017    Lab Results  Component Value Date   CREATININE 1.47 (H) 04/29/2017   BUN 13 04/29/2017   NA 134 (L) 04/29/2017   K 3.6 04/29/2017   CL 100 (L) 04/29/2017   CO2 21 (L) 04/29/2017    Lab Results  Component Value Date   ALT 18 04/28/2017   AST 20 04/28/2017   ALKPHOS 121 04/28/2017     Microbiology: Recent Results (from the past 240 hour(s))  Culture, blood (routine x 2)     Status: Abnormal (Preliminary result)   Collection Time: 04/29/17 12:00 AM  Result Value Ref Range Status   Specimen Description BLOOD RIGHT ANTECUBITAL  Final   Special Requests IN PEDIATRIC BOTTLE Blood Culture adequate volume  Final   Culture  Setup Time   Final    GRAM POSITIVE COCCI IN CLUSTERS IN PEDIATRIC BOTTLE CRITICAL RESULT CALLED TO, READ BACK BY AND VERIFIED WITH: J. CARNEY, RPHARMD AT 1810 ON 04/29/17 BY C. JESSUP, MLT.    Culture STAPHYLOCOCCUS AUREUS (A)  Final   Report Status PENDING  Incomplete  Culture, blood (routine x 2)     Status: Abnormal (Preliminary result)   Collection Time: 04/29/17 12:08 AM  Result Value Ref Range Status   Specimen Description BLOOD RIGHT ANTECUBITAL  Final   Special  Requests IN PEDIATRIC BOTTLE Blood Culture adequate volume  Final   Culture  Setup Time   Final    GRAM POSITIVE COCCI IN PEDIATRIC BOTTLE CRITICAL RESULT CALLED TO, READ BACK BY AND VERIFIED WITH: J. CARNEY, RPHARMD AT 1810 ON 04/29/17 BY C. JESSUP, MLT.    Culture (A)  Final    STAPHYLOCOCCUS AUREUS SUSCEPTIBILITIES TO FOLLOW    Report Status PENDING  Incomplete  Blood Culture ID Panel (Reflexed)     Status: Abnormal   Collection Time: 04/29/17 12:08 AM  Result Value Ref Range Status   Enterococcus species NOT DETECTED NOT DETECTED Final   Listeria monocytogenes NOT DETECTED NOT DETECTED Final   Staphylococcus species DETECTED (A) NOT DETECTED Final    Comment: CRITICAL RESULT CALLED TO, READ  BACK BY AND VERIFIED WITH: J. CARNEY, RPHARMD AT 1810 ON 04/29/17 BY C. JESSUP, MLT.    Staphylococcus aureus DETECTED (A) NOT DETECTED Final    Comment: Methicillin (oxacillin)-resistant Staphylococcus aureus (MRSA). MRSA is predictably resistant to beta-lactam antibiotics (except ceftaroline). Preferred therapy is vancomycin unless clinically contraindicated. Patient requires contact precautions if  hospitalized. CRITICAL RESULT CALLED TO, READ BACK BY AND VERIFIED WITH: J. CARNEY, RPHARMD AT 1810 ON 04/29/17 BY C. JESSUP, MLT.    Methicillin resistance DETECTED (A) NOT DETECTED Final    Comment: CRITICAL RESULT CALLED TO, READ BACK BY AND VERIFIED WITH: J. CARNEY, RPHARMD AT 1810 ON 04/29/17 BY C. JESSUP, MLT.    Streptococcus species NOT DETECTED NOT DETECTED Final   Streptococcus agalactiae NOT DETECTED NOT DETECTED Final   Streptococcus pneumoniae NOT DETECTED NOT DETECTED Final   Streptococcus pyogenes NOT DETECTED NOT DETECTED Final   Acinetobacter baumannii NOT DETECTED NOT DETECTED Final   Enterobacter cloacae complex NOT DETECTED NOT DETECTED Final   Escherichia coli NOT DETECTED NOT DETECTED Final   Klebsiella oxytoca NOT DETECTED NOT DETECTED Final   Klebsiella pneumoniae NOT  DETECTED NOT DETECTED Final   Serratia marcescens NOT DETECTED NOT DETECTED Final   Carbapenem resistance NOT DETECTED NOT DETECTED Final   Haemophilus influenzae NOT DETECTED NOT DETECTED Final   Neisseria meningitidis NOT DETECTED NOT DETECTED Final   Pseudomonas aeruginosa NOT DETECTED NOT DETECTED Final   Candida albicans NOT DETECTED NOT DETECTED Final   Candida glabrata NOT DETECTED NOT DETECTED Final   Candida krusei NOT DETECTED NOT DETECTED Final   Candida parapsilosis NOT DETECTED NOT DETECTED Final   Candida tropicalis NOT DETECTED NOT DETECTED Final  MRSA PCR Screening     Status: Abnormal   Collection Time: 04/29/17  4:38 AM  Result Value Ref Range Status   MRSA by PCR POSITIVE (A) NEGATIVE Final    Comment:        The GeneXpert MRSA Assay (FDA approved for NASAL specimens only), is one component of a comprehensive MRSA colonization surveillance program. It is not intended to diagnose MRSA infection nor to guide or monitor treatment for MRSA infections. RESULT CALLED TO, READ BACK BY AND VERIFIED WITH: RN R Arlester Marker [id number] MLM   Wound or Superficial Culture     Status: None (Preliminary result)   Collection Time: 04/29/17 12:10 PM  Result Value Ref Range Status   Specimen Description WOUND FOOT  Final   Special Requests NONE  Final   Gram Stain   Final    NO WBC SEEN RARE GRAM POSITIVE COCCI IN PAIRS IN SINGLES    Culture PENDING  Incomplete   Report Status PENDING  Incomplete    Velna Ochs, MD - PGY [place] [facility] Infectious [facility] [email or web address] [phone] pager  [phone] cell 04/30/2017, 10:06 AM

## 2017-04-30 NOTE — Progress Notes (Signed)
PROGRESS NOTE  Keith [name]  TGG:[id number] DOB: 02/12/1966 DOA: 04/28/2017 PCP: Scot Jun, FNP   Brief Narrative: Keith [name] is a 52 y.o. male with a history of HIV on ART (last CD4 210, undetectable VL), T2DM, HTN, stage III CKD, and chronic right foot osteomyelitis who presented to the ED 1/21 with worsening distal plantar pain and fever/chills concerning for worsening wound infection/osteomyelitis despite outpatient augmentin. He was tachycardic, afebrile with leukocytosis  (WBC 13.4) and XR demonstrating progressive demineralization in the first metatarsal head suggesting osteomyelitis. CRP found to be 14.9, ESR 140, lactic acid reassuringly normal. Broad IV abx started and [name] contacted for orthopedic consultation.   Assessment & Plan: Active Problems:   Osteomyelitis of metatarsal (HCC)  Sepsis due MRSA bacteremia and acute on chronic osteomyelitis of right first metatarsal with overlying cellulitis of right foot: MRI with significant worsening of osteo, myositis, and abscess. ESR 140, CRP 14.9. Has been followed closely by [name]. - Continue vancomycin.  - Monitor repeat blood cultures from 1/23. - TTE ordered, appreciate ID assistance.  - Discussed with [name] who will be able to evaluate the patient 1/24. I've made NPO after MN on 1/24.  - Pain control  HIV on ART:  - CD4 count down to 80, VL undetectable. ID on board. - Continue Triumeq, Prezcobix  IDT2DM:  - Continue lantus 20u qHS, SSI qAC/HS. CBGs well controlled. - Pt has very little insight into effects of diet. Will consult RD.  Stage III CKD: Cr appears stable to baseline.  - Monitor BMP, UOP - Renally dose medications, caution with vancomycin  Hematuria: Uncertain etiology. Hgb near baseline.  - Defer further evaluation to outpatient setting.   Protein gap: Protein gap of 5.4 present is likely secondary to HIV infection.  - Can consider further workup for paraproteinemia in the  outpatient setting.  DVT prophylaxis: Lovenox Code Status: Full Family Communication: SO at bedside Disposition Plan: Uncertain  Consultants:   Orthopedic surgery, [name]  ID  Procedures:   None  Antimicrobials:  Vancomycin 1/21 >>   Cefepime 1/21 - 1/22  Zosyn 1/22 >>    Subjective: Eating chicken pot pie from [facility] with soda and cookies. Still having fevers. Very concerned about need for amputation.   Objective: Vitals:   04/29/17 1600 04/29/17 2050 04/30/17 0539 04/30/17 1458  BP: (!) 147/91 122/77 137/77 105/60  Pulse: 99 96 95 89  Resp: '18 18 18 18  '$ Temp: (!) 100.4 F (38 C) (!) 101.6 F (38.7 C) 98.6 F (37 C) 98.5 F (36.9 C)  TempSrc: Oral Oral Oral Oral  SpO2: 100% 98% 98% 98%  Weight:   110.3 kg (243 lb 2.7 oz)   Height:        Intake/Output Summary (Last 24 hours) at 04/30/2017 1651 Last data filed at 04/30/2017 1600 Gross per 24 hour  Intake 1210 ml  Output 950 ml  Net 260 ml   Filed Weights   04/28/17 1413 04/29/17 0500 04/30/17 0539  Weight: 115.7 kg (255 lb) 111.5 kg (245 lb 13 oz) 110.3 kg (243 lb 2.7 oz)    Gen: 52 y.o. male in no distress Pulm: Non-labored breathing room air. Clear to auscultation bilaterally. CV: Regular borderline tachycardia. No murmur, rub, or gallop. No JVD, no significant pedal edema. GI: Abdomen soft, non-tender, non-distended, with normoactive bowel sounds. No organomegaly or masses felt. Skin: Right foot wound not examined today, kerlix dressing with serosnguinous discharge, palpable DP pulse. Neuro: Alert  and oriented. No focal neurological deficits. Psych: Judgement and insight appear normal. Mood & affect appropriate.   Data Reviewed: I have personally reviewed following labs and imaging studies  CBC: Recent Labs  Lab 04/28/17 1444 04/29/17 0644 04/30/17 0923  WBC 13.4* 11.9* 13.9*  NEUTROABS 10.3* 9.1*  --   HGB 9.3* 8.9* 8.9*  HCT 28.1* 28.0* 27.3*  MCV 85.7 85.6 84.5  PLT 363 342 242    Basic Metabolic Panel: Recent Labs  Lab 04/28/17 1444 04/28/17 2347 04/29/17 0640 04/30/17 0923  NA 135  --  134* 136  K 3.8  --  3.6 3.5  CL 101  --  100* 100*  CO2 23  --  21* 22  GLUCOSE 173*  --  120* 95  BUN 14  --  13 13  CREATININE 1.56*  --  1.47* 1.56*  CALCIUM 9.2  --  8.9 9.1  MG  --  1.7  --   --   PHOS  --  3.1  --   --    GFR: Estimated Creatinine Clearance: 67.8 mL/min (A) (by C-G formula based on SCr of 1.56 mg/dL (H)). Liver Function Tests: Recent Labs  Lab 04/28/17 1444  AST 20  ALT 18  ALKPHOS 121  BILITOT 0.8  PROT 8.4*  ALBUMIN 3.0*   No results for input(s): LIPASE, AMYLASE in the last 168 hours. No results for input(s): AMMONIA in the last 168 hours. Coagulation Profile: Recent Labs  Lab 04/28/17 2347  INR 1.16   Cardiac Enzymes: No results for input(s): CKTOTAL, CKMB, CKMBINDEX, TROPONINI in the last 168 hours. BNP (last 3 results) No results for input(s): PROBNP in the last 8760 hours. HbA1C: No results for input(s): HGBA1C in the last 72 hours. CBG: Recent Labs  Lab 04/29/17 1635 04/29/17 2055 04/30/17 0623 04/30/17 1319 04/30/17 1635  GLUCAP 118* 107* 89 103* 155*   Lipid Profile: No results for input(s): CHOL, HDL, LDLCALC, TRIG, CHOLHDL, LDLDIRECT in the last 72 hours. Thyroid Function Tests: No results for input(s): TSH, T4TOTAL, FREET4, T3FREE, THYROIDAB in the last 72 hours. Anemia Panel: No results for input(s): VITAMINB12, FOLATE, FERRITIN, TIBC, IRON, RETICCTPCT in the last 72 hours. Urine analysis:    Component Value Date/Time   COLORURINE YELLOW 04/29/2017 0438   APPEARANCEUR HAZY (A) 04/29/2017 0438   LABSPEC 1.018 04/29/2017 0438   PHURINE 5.0 04/29/2017 0438   GLUCOSEU NEGATIVE 04/29/2017 0438   HGBUR SMALL (A) 04/29/2017 0438   BILIRUBINUR NEGATIVE 04/29/2017 0438   KETONESUR NEGATIVE 04/29/2017 0438   PROTEINUR 100 (A) 04/29/2017 0438   UROBILINOGEN 1.0 01/01/2017 1032   NITRITE NEGATIVE 04/29/2017  0438   LEUKOCYTESUR NEGATIVE 04/29/2017 0438   Recent Results (from the past 240 hour(s))  Culture, blood (routine x 2)     Status: Abnormal (Preliminary result)   Collection Time: 04/29/17 12:00 AM  Result Value Ref Range Status   Specimen Description BLOOD RIGHT ANTECUBITAL  Final   Special Requests IN PEDIATRIC BOTTLE Blood Culture adequate volume  Final   Culture  Setup Time   Final    GRAM POSITIVE COCCI IN CLUSTERS IN PEDIATRIC BOTTLE CRITICAL RESULT CALLED TO, READ BACK BY AND VERIFIED WITH: J. CARNEY, RPHARMD AT 1810 ON 04/29/17 BY C. JESSUP, MLT.    Culture STAPHYLOCOCCUS AUREUS (A)  Final   Report Status PENDING  Incomplete  Culture, blood (routine x 2)     Status: Abnormal (Preliminary result)   Collection Time: 04/29/17 12:08 AM  Result Value Ref  Range Status   Specimen Description BLOOD RIGHT ANTECUBITAL  Final   Special Requests IN PEDIATRIC BOTTLE Blood Culture adequate volume  Final   Culture  Setup Time   Final    GRAM POSITIVE COCCI IN PEDIATRIC BOTTLE CRITICAL RESULT CALLED TO, READ BACK BY AND VERIFIED WITH: J. CARNEY, RPHARMD AT 1810 ON 04/29/17 BY C. JESSUP, MLT.    Culture (A)  Final    STAPHYLOCOCCUS AUREUS SUSCEPTIBILITIES TO FOLLOW    Report Status PENDING  Incomplete  Blood Culture ID Panel (Reflexed)     Status: Abnormal   Collection Time: 04/29/17 12:08 AM  Result Value Ref Range Status   Enterococcus species NOT DETECTED NOT DETECTED Final   Listeria monocytogenes NOT DETECTED NOT DETECTED Final   Staphylococcus species DETECTED (A) NOT DETECTED Final    Comment: CRITICAL RESULT CALLED TO, READ BACK BY AND VERIFIED WITH: J. CARNEY, RPHARMD AT 1810 ON 04/29/17 BY C. JESSUP, MLT.    Staphylococcus aureus DETECTED (A) NOT DETECTED Final    Comment: Methicillin (oxacillin)-resistant Staphylococcus aureus (MRSA). MRSA is predictably resistant to beta-lactam antibiotics (except ceftaroline). Preferred therapy is vancomycin unless clinically  contraindicated. Patient requires contact precautions if  hospitalized. CRITICAL RESULT CALLED TO, READ BACK BY AND VERIFIED WITH: J. CARNEY, RPHARMD AT 1810 ON 04/29/17 BY C. JESSUP, MLT.    Methicillin resistance DETECTED (A) NOT DETECTED Final    Comment: CRITICAL RESULT CALLED TO, READ BACK BY AND VERIFIED WITH: J. CARNEY, RPHARMD AT 1810 ON 04/29/17 BY C. JESSUP, MLT.    Streptococcus species NOT DETECTED NOT DETECTED Final   Streptococcus agalactiae NOT DETECTED NOT DETECTED Final   Streptococcus pneumoniae NOT DETECTED NOT DETECTED Final   Streptococcus pyogenes NOT DETECTED NOT DETECTED Final   Acinetobacter baumannii NOT DETECTED NOT DETECTED Final   Enterobacter cloacae complex NOT DETECTED NOT DETECTED Final   Escherichia coli NOT DETECTED NOT DETECTED Final   Klebsiella oxytoca NOT DETECTED NOT DETECTED Final   Klebsiella pneumoniae NOT DETECTED NOT DETECTED Final   Serratia marcescens NOT DETECTED NOT DETECTED Final   Carbapenem resistance NOT DETECTED NOT DETECTED Final   Haemophilus influenzae NOT DETECTED NOT DETECTED Final   Neisseria meningitidis NOT DETECTED NOT DETECTED Final   Pseudomonas aeruginosa NOT DETECTED NOT DETECTED Final   Candida albicans NOT DETECTED NOT DETECTED Final   Candida glabrata NOT DETECTED NOT DETECTED Final   Candida krusei NOT DETECTED NOT DETECTED Final   Candida parapsilosis NOT DETECTED NOT DETECTED Final   Candida tropicalis NOT DETECTED NOT DETECTED Final  MRSA PCR Screening     Status: Abnormal   Collection Time: 04/29/17  4:38 AM  Result Value Ref Range Status   MRSA by PCR POSITIVE (A) NEGATIVE Final    Comment:        The GeneXpert MRSA Assay (FDA approved for NASAL specimens only), is one component of a comprehensive MRSA colonization surveillance program. It is not intended to diagnose MRSA infection nor to guide or monitor treatment for MRSA infections. RESULT CALLED TO, READ BACK BY AND VERIFIED WITH: RN R DULLA  [id number] MLM   Wound or Superficial Culture     Status: None (Preliminary result)   Collection Time: 04/29/17 12:10 PM  Result Value Ref Range Status   Specimen Description WOUND FOOT  Final   Special Requests NONE  Final   Gram Stain   Final    NO WBC SEEN RARE GRAM POSITIVE COCCI IN PAIRS IN SINGLES    Culture  Final    FEW STAPHYLOCOCCUS AUREUS SUSCEPTIBILITIES TO FOLLOW    Report Status PENDING  Incomplete      Radiology Studies: Mr Foot Right W Wo Contrast  Result Date: 04/30/2017 CLINICAL DATA:  Diabetic patient with chronic skin ulcerations on the foot. Question osteomyelitis. EXAM: MRI OF THE RIGHT FOREFOOT WITHOUT AND WITH CONTRAST TECHNIQUE: Multiplanar, multisequence MR imaging of the right forefoot was performed before and after the administration of intravenous contrast. CONTRAST:  49m MULTIHANCE GADOBENATE DIMEGLUMINE 529 MG/ML IV SOLN COMPARISON:  Plain films right foot 03/14/2017 and 04/28/2017. MRI right foot 03/15/2017. FINDINGS: Bones/Joint/Cartilage Since the prior examination, the patient developed marrow edema and enhancement throughout the first metatarsal and great toe. Edema and enhancement are most intense about the first MTP joint but the appearance is most consistent with osteomyelitis throughout. Small first MTP joint effusion is noted. The patient also has new edema and enhancement in almost the entire second metatarsal, most intense in the head and neck of the metatarsal. Edema and enhancement are also seen throughout the proximal phalanx of the second toe. New intense edema and enhancement are seen in the proximal 2.5 cm of the fifth metatarsal. Low level edema and enhancement are seen in the third, fourth and fifth toes and head and necks of the third, fourth and fifth metatarsals. Ligaments Intact. Muscles and Tendons Edema and enhancement are seen in the intrinsic musculature of the foot. No intramuscular abscess is identified. Soft tissues A large skin  wound is seen on the plantar surface of the foot between the first and second metatarsal heads. There also skin wound at the base of the fifth metatarsal. A subcutaneous fluid collection superficial to the second MTP joint measures 0.8 cm long by 0.7 cm craniocaudal by 0.5 cm transverse and is consistent with an abscess. IMPRESSION: Markedly worsened appearance of the foot since the patient's prior MRI 03/15/2017. Findings consistent with osteomyelitis throughout almost the entire first and second rays and proximal 2.5 cm of the fifth metatarsal. Mild edema and enhancement in the third, fourth and fifth toes and distal third, fourth and fifth metatarsals could be reactive rather than due to osteomyelitis. Signal change and enhancement are worst in the distal third metatarsal and third toe. Large skin wound on the plantar surface of the foot between the first and second metatarsal heads. Second skin ulceration at the base of the fifth metatarsal is identified. Edema and enhancement in intrinsic musculature the foot compatible with myositis. Small fluid collection just superior to the second MTP joint consistent with abscess. Electronically Signed   By: TInge RiseM.D.   On: 04/30/2017 12:55   Dg Foot Complete Right  Result Date: 04/28/2017 CLINICAL DATA:  C/o pain to right foot. Best obtainable images due to patients pain. Hx diabetes, htn EXAM: RIGHT FOOT COMPLETE - 3+ VIEW COMPARISON:  03/14/2017 FINDINGS: Progressive patchy demineralization in the head of the first metatarsal. Otherwise normal mineralization. Great toe sesamoids are no longer visible possibly surgically resected. There is an associated plantar skin defect. Stap if le projects over the base of the fifth metatarsal. Normal alignment. IMPRESSION: 1. Progressive demineralization in the first metatarsal head suggesting osteomyelitis. Electronically Signed   By: DLucrezia EuropeM.D.   On: 04/28/2017 19:10    Scheduled Meds: .  abacavir-dolutegravir-lamiVUDine  1 tablet Oral QHS  . darunavir-cobicistat  1 tablet Oral Q breakfast  . docusate sodium  100 mg Oral BID  . enoxaparin (LOVENOX) injection  55 mg Subcutaneous Daily  .  insulin aspart  0-15 Units Subcutaneous TID WC  . insulin aspart  0-5 Units Subcutaneous QHS  . insulin glargine  20 Units Subcutaneous Q2200  . polyethylene glycol  17 g Oral Daily  . senna  1 tablet Oral BID   Continuous Infusions: . vancomycin 750 mg (04/30/17 1238)     LOS: 2 days   Time spent: 25 minutes.  Vance Gather, MD [facility] Pager [phone]  If 7PM-7AM, please contact night-coverage [email or web address] Password [facility] 04/30/2017, 4:51 PM

## 2017-04-30 NOTE — Progress Notes (Signed)
  Echocardiogram 2D Echocardiogram has been performed.  Keith [name] 04/30/2017, 5:01 PM

## 2017-04-30 NOTE — Progress Notes (Signed)
[facility] Infusion Coordinator  will follow Keith [name] while an inpatient to support home IV ABX if home is his DC location and IV ABX are needed.   If patient discharges after hours, please call [phone].   Larry Sierras 04/30/2017, 9:49 AM

## 2017-05-01 ENCOUNTER — Ambulatory Visit (INDEPENDENT_AMBULATORY_CARE_PROVIDER_SITE_OTHER): Payer: Medicare Other | Admitting: Orthopedic Surgery

## 2017-05-01 ENCOUNTER — Telehealth (INDEPENDENT_AMBULATORY_CARE_PROVIDER_SITE_OTHER): Payer: Self-pay | Admitting: Orthopedic Surgery

## 2017-05-01 LAB — CULTURE, BLOOD (ROUTINE X 2)
Special Requests: ADEQUATE
Special Requests: ADEQUATE

## 2017-05-01 LAB — BASIC METABOLIC PANEL
Anion gap: 13 (ref 5–15)
BUN: 10 mg/dL (ref 6–20)
CO2: 24 mmol/L (ref 22–32)
Calcium: 9 mg/dL (ref 8.9–10.3)
Chloride: 99 mmol/L — ABNORMAL LOW (ref 101–111)
Creatinine, Ser: 1.21 mg/dL (ref 0.61–1.24)
GFR calc Af Amer: >60 mL/min (ref >60)
GFR calc non Af Amer: >60 mL/min (ref >60)
Glucose, Bld: 99 mg/dL (ref 65–99)
Potassium: 3.3 mmol/L — ABNORMAL LOW (ref 3.5–5.1)
Sodium: 136 mmol/L (ref 135–145)

## 2017-05-01 LAB — CBC
HCT: 28.1 % — ABNORMAL LOW (ref 39.0–52.0)
Hemoglobin: 9.2 g/dL — ABNORMAL LOW (ref 13.0–17.0)
MCH: 27.4 pg (ref 26.0–34.0)
MCHC: 32.7 g/dL (ref 30.0–36.0)
MCV: 83.6 fL (ref 78.0–100.0)
Platelets: 400 10*3/uL (ref 150–400)
RBC: 3.36 MIL/uL — ABNORMAL LOW (ref 4.22–5.81)
RDW: 14 % (ref 11.5–15.5)
WBC: 9.6 10*3/uL (ref 4.0–10.5)

## 2017-05-01 LAB — VANCOMYCIN, TROUGH: Vancomycin Tr: 16 ug/mL (ref 15–20)

## 2017-05-01 LAB — AEROBIC CULTURE W GRAM STAIN (SUPERFICIAL SPECIMEN): Gram Stain: NONE SEEN

## 2017-05-01 LAB — GLUCOSE, CAPILLARY
Glucose-Capillary: 117 mg/dL — ABNORMAL HIGH (ref 65–99)
Glucose-Capillary: 191 mg/dL — ABNORMAL HIGH (ref 65–99)

## 2017-05-01 IMAGING — CR DG HAND 2V*R*
3 series · 3 of 3 positions shown · non-contrast
Comparison: RIGHT second finger radiograph March 08, 2015.

CLINICAL DATA: Fifth finger pain, possibly from hang nail. Assess
for gas. History of HIV, hand osteomyelitis and diabetes.

EXAM:
RIGHT HAND - 2 VIEW

[hand pa]
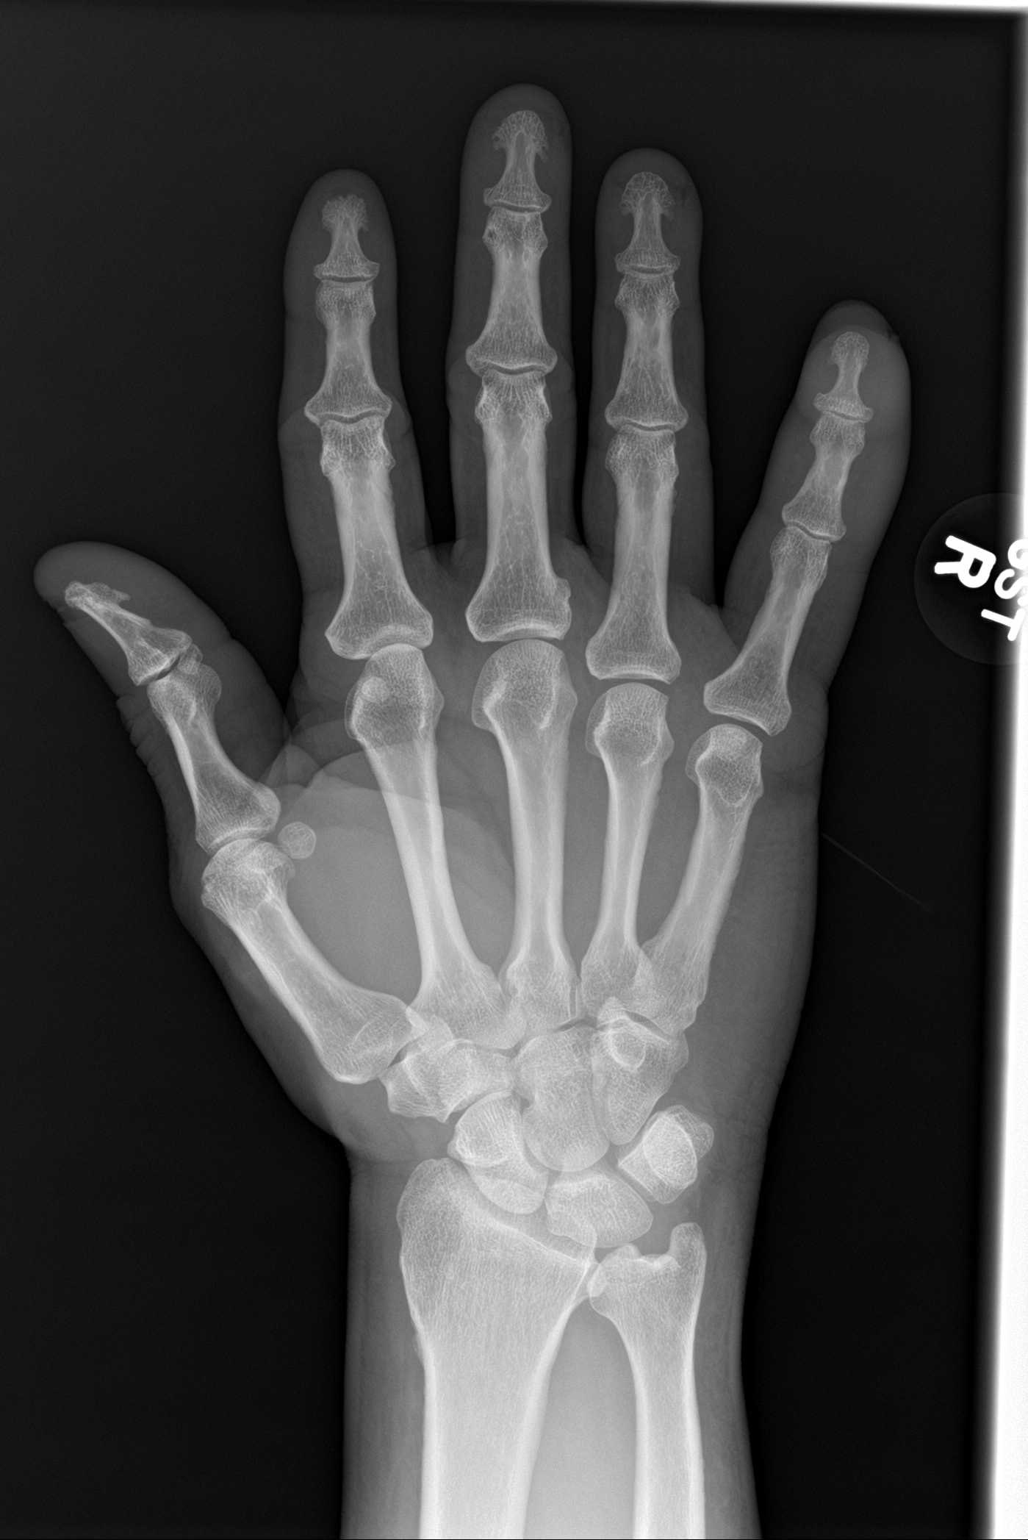

[hand lat (1 of 2)]
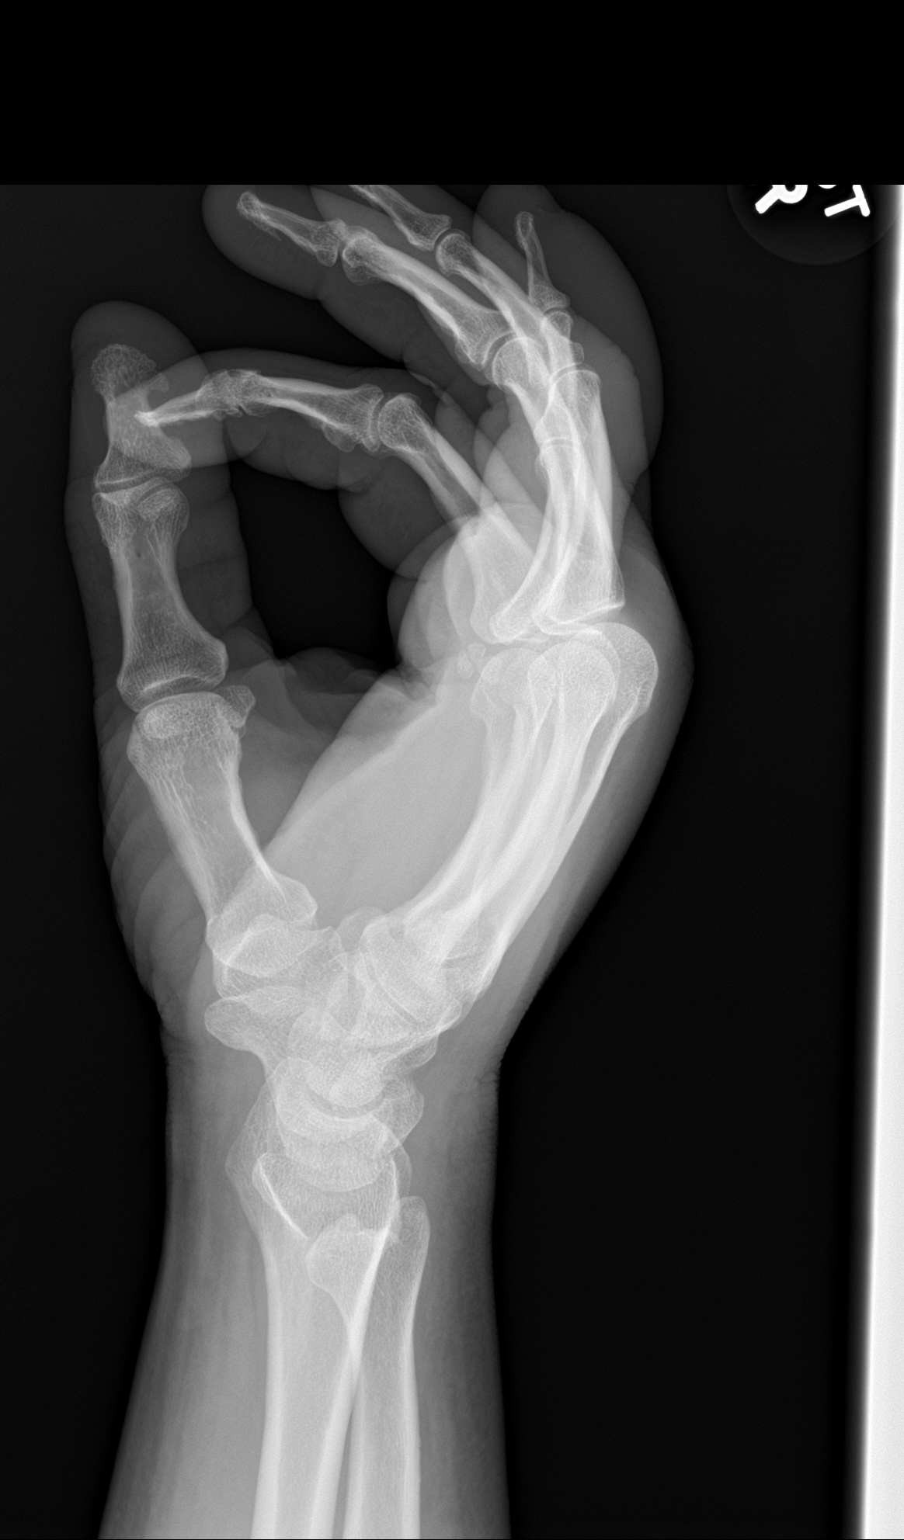

[hand lat (2 of 2)]
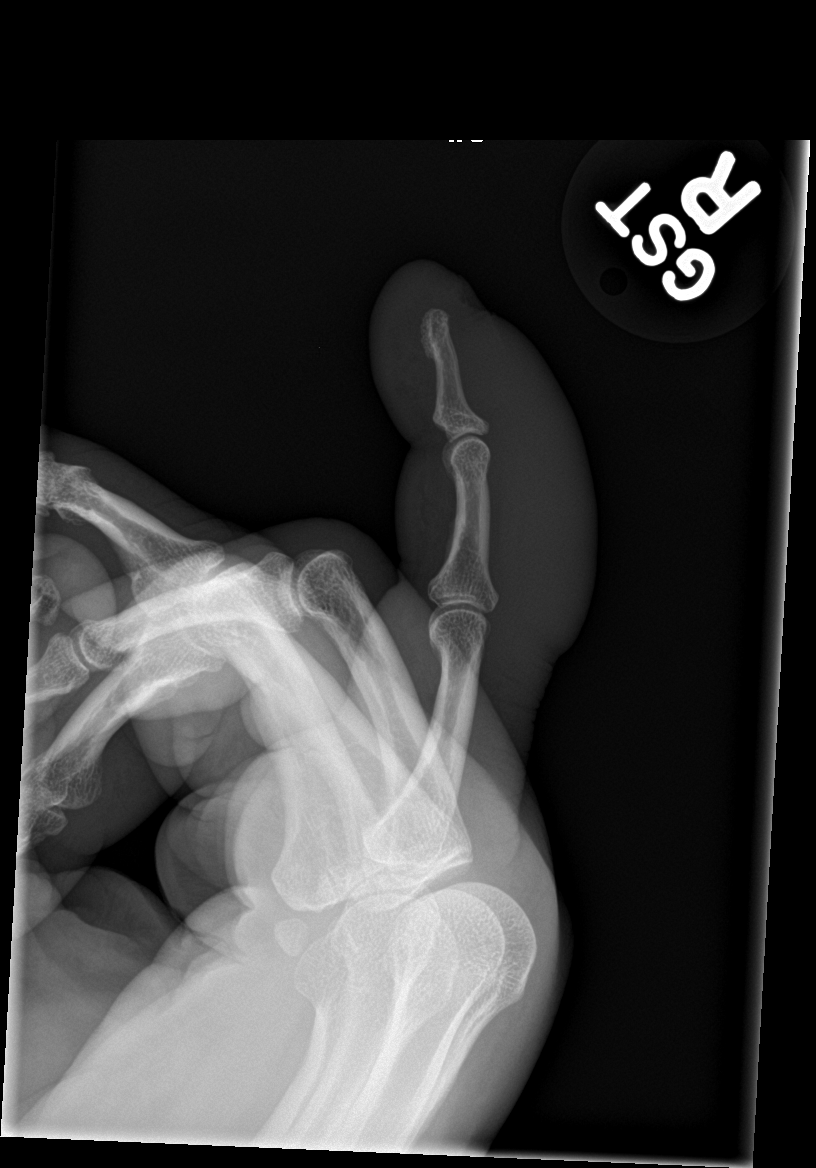

[3 of 3 positions shown; findings below may reference images not displayed]

FINDINGS: No acute fracture deformity or dislocation. Joint spaces intact
without erosions. No destructive bony lesions. Marked fifth digit
soft tissue swelling without subcutaneous gas or radiopaque foreign
bodies.
IMPRESSION: Marked fifth finger soft tissue swelling, no gas or acute osseous
process.

## 2017-05-01 MED ORDER — DAPSONE 100 MG PO TABS
100.0000 mg | ORAL_TABLET | Freq: Every day | ORAL | Status: DC
  Administered 2017-05-01 – 2017-05-06 (×6): 100 mg via ORAL
  Filled 2017-05-01 (×6): qty 1

## 2017-05-01 NOTE — Progress Notes (Signed)
PHARMACY - PHYSICIAN COMMUNICATION CRITICAL VALUE ALERT - BLOOD CULTURE IDENTIFICATION (BCID)  Keith [name] is an 52 y.o. male who presented to [facility] on 04/28/2017 with a chief complaint of progressive chronic right first metatarsal osteomyelitis despite outpatient therapy with Augmentin. Now with MRSA bacteremia  Assessment:  1 of 2 bottles positive G+ cocci, no BCID performed as recent results less than 5 days ago.  Name of physician (or Provider) Contacted: Velna Ochs, MD   Current antibiotics: IV Vancomycin 750 q12, continue   Changes to prescribed antibiotics recommended:  Patient is on recommended antibiotics - No changes needed  Results for orders placed or performed during the hospital encounter of 04/28/17  Blood Culture ID Panel (Reflexed) (Collected: 04/29/2017 12:08 AM)  Result Value Ref Range   Enterococcus species NOT DETECTED NOT DETECTED   Listeria monocytogenes NOT DETECTED NOT DETECTED   Staphylococcus species DETECTED (A) NOT DETECTED   Staphylococcus aureus DETECTED (A) NOT DETECTED   Methicillin resistance DETECTED (A) NOT DETECTED   Streptococcus species NOT DETECTED NOT DETECTED   Streptococcus agalactiae NOT DETECTED NOT DETECTED   Streptococcus pneumoniae NOT DETECTED NOT DETECTED   Streptococcus pyogenes NOT DETECTED NOT DETECTED   Acinetobacter baumannii NOT DETECTED NOT DETECTED   Enterobacter cloacae complex NOT DETECTED NOT DETECTED   Escherichia coli NOT DETECTED NOT DETECTED   Klebsiella oxytoca NOT DETECTED NOT DETECTED   Klebsiella pneumoniae NOT DETECTED NOT DETECTED   Serratia marcescens NOT DETECTED NOT DETECTED   Carbapenem resistance NOT DETECTED NOT DETECTED   Haemophilus influenzae NOT DETECTED NOT DETECTED   Neisseria meningitidis NOT DETECTED NOT DETECTED   Pseudomonas aeruginosa NOT DETECTED NOT DETECTED   Candida albicans NOT DETECTED NOT DETECTED   Candida glabrata NOT DETECTED NOT DETECTED   Candida krusei NOT DETECTED  NOT DETECTED   Candida parapsilosis NOT DETECTED NOT DETECTED   Candida tropicalis NOT DETECTED NOT DETECTED    Jalene Mullet 05/01/2017  11:49 AM

## 2017-05-01 NOTE — Progress Notes (Signed)
Results for KELDEN, LAVALLEE (MRN [id number]) as of 05/01/2017 08:50  Ref. Range 04/30/2017 06:23 04/30/2017 13:19 04/30/2017 16:35 04/30/2017 21:25 05/01/2017 06:30  Glucose-Capillary Latest Ref Range: 65 - 99 mg/dL 89 103 (H) 155 (H) 101 (H) 117 (H)  Received diabetes coordinator consult. Noted that blood sugars look good. Patient's HgbA1C is 10.1% on 03-15-17.  Coordinators are very familiar with this patient and have seen him many times. Tama Headings RN, diabetes coordinator, spoke with him on 03-19-17.  Will continue to monitor blood sugars while in the hospital.  [name] RN BSN CDE Diabetes Coordinator Pager: [phone]  8am-5pm

## 2017-05-01 NOTE — H&P (View-Only) (Signed)
ORTHOPAEDIC CONSULTATION  REQUESTING PHYSICIAN: Patrecia Pour, MD  Chief Complaint: Pain ulceration right foot.  HPI: Keith [name] is a 52 y.o. male who presents status post debridement of abscess plantar aspect of the right foot.  This wound is healing and has good granulation tissue however patient developed increasing pain in the first and second metatarsal of the forefoot.  Past Medical History:  Diagnosis Date  . AIDS ([place])   . Chronic knee pain    right  . Chronic pain   . Diabetes mellitus   . Diabetes type 2, uncontrolled ([place])    HgA1c 17.6 (04/27/2010)  . Diabetic foot ulcer ([place]) 01/2017   right foot  . Erectile dysfunction   . Genital warts   . HIV (human immunodeficiency virus infection) ([place]) 2009   CD4 count 100, VL 13800 (05/01/2010)  . Hypertension   . Osteomyelitis [facility])    h/o hand   Past Surgical History:  Procedure Laterality Date  . HERNIA REPAIR    . I&D EXTREMITY Left 08/21/2014   Procedure: INCISION AND DRAINAGE LEFT SMALL FINGER;  Surgeon: Leanora Cover, MD;  Location: [place];  Service: Orthopedics;  Laterality: Left;  . I&D EXTREMITY Right 03/18/2017   Procedure: IRRIGATION AND DEBRIDEMENT EXTREMITY;  Surgeon: [name], MD;  Location: [place];  Service: Orthopedics;  Laterality: Right;  . MINOR IRRIGATION AND DEBRIDEMENT OF WOUND Right 04/22/2014   Procedure: IRRIGATION AND DEBRIDEMENT OF RIGHT NECK ABCESS;  Surgeon: Jerrell Belfast, MD;  Location: [place];  Service: ENT;  Laterality: Right;  . MULTIPLE EXTRACTIONS WITH ALVEOLOPLASTY N/A 01/18/2013   Procedure: MULTIPLE EXTRACION 3, 6, 7, 10, 11, 13, 21, 22, 27, 28, 29, 30 WITH ALVEOLOPLASTY;  Surgeon: Gae Bon, DDS;  Location: [place];  Service: Oral Surgery;  Laterality: N/A;  . SKIN SPLIT GRAFT Right 03/21/2017   Procedure: IRRIGATION AND DEBRIDEMENT RIGHT FOOT AND APPLY SPLIT THICKNESS SKIN GRAFT AND WOUND VAC;  Surgeon: [name], MD;  Location: [place];  Service: Orthopedics;   Laterality: Right;   Social History   Socioeconomic History  . Marital status: Single    Spouse name: None  . Number of children: 50  . Years of education: 63  . Highest education level: None  Social Needs  . Financial resource strain: None  . Food insecurity - worry: None  . Food insecurity - inability: None  . Transportation needs - medical: None  . Transportation needs - non-medical: None  Occupational History  . None  Tobacco Use  . Smoking status: Never Smoker  . Smokeless tobacco: Never Used  Substance and Sexual Activity  . Alcohol use: No    Alcohol/week: 0.0 oz  . Drug use: No  . Sexual activity: Yes    Partners: Female    Comment: given condoms  Other Topics Concern  . None  Social History Narrative   Worked for the city of [place] for 18 years.   Unemployed.    Applying for disability.   Medicaid patient.   Family History  Problem Relation Age of Onset  . Hypertension Mother   . Arthritis Father   . Hypertension Father   . Hypertension Brother   . Cancer Maternal Grandmother 69       unknown type of cancer  . Depression Paternal Grandmother    - negative except otherwise stated in the family history section Allergies  Allergen Reactions  . Sulfa Antibiotics Itching   Prior to Admission medications  Medication Sig Start Date End Date Taking? Authorizing Provider  abacavir-dolutegravir-lamiVUDine (TRIUMEQ) 600-50-300 MG tablet Take 1 tablet by mouth daily. Patient taking differently: Take 1 tablet by mouth at bedtime.  01/20/17  Yes [name], MD  amLODipine (NORVASC) 10 MG tablet Take 1 tablet (10 mg total) by mouth daily. Patient taking differently: Take 10 mg by mouth at bedtime.  01/17/17  Yes Nita Sells, MD  amoxicillin-clavulanate (AUGMENTIN) 875-125 MG tablet Take 1 tablet by mouth 2 (two) times daily. Patient taking differently: Take 1 tablet by mouth 2 (two) times daily. continuous 04/15/17  Yes [name], MD    cyclobenzaprine (FLEXERIL) 10 MG tablet Take 1 tablet (10 mg total) by mouth 2 (two) times daily as needed for muscle spasms. 10/25/16  Yes Neese, Olmito and Olmito, NP  darunavir-cobicistat (PREZCOBIX) 800-150 MG tablet Take 1 tablet by mouth daily. Swallow whole. Do NOT crush, break or chew tablets. Take with food. 01/20/17  Yes [name], MD  glimepiride (AMARYL) 1 MG tablet Take 1 tablet (1 mg total) by mouth every morning. Patient taking differently: Take 1 mg by mouth daily.  01/16/17 01/16/18 Yes Nita Sells, MD  Insulin Glargine (LANTUS SOLOSTAR) 100 UNIT/ML Solostar Pen Inject 20 Units into the skin daily at 10 pm. 01/16/17  Yes Nita Sells, MD  lisinopril (PRINIVIL,ZESTRIL) 5 MG tablet Take 5 mg by mouth daily. 04/23/17  Yes [provider]  LORazepam (ATIVAN) 1 MG tablet Take 1 tablet (1 mg total) by mouth every 6 (six) hours as needed for anxiety. Patient taking differently: Take 1 mg by mouth 3 (three) times daily.  03/24/17  Yes Alma Friendly, MD  naloxone [facility]) nasal spray 4 mg/0.1 mL Place 1 spray into the nose daily as needed (opioid overdose).   Yes [provider]  Oxycodone HCl 10 MG TABS Take 10 mg by mouth 3 (three) times daily. 04/22/17  Yes [provider]  oxyCODONE-acetaminophen (PERCOCET) 10-325 MG tablet Take 1 tablet by mouth every 8 (eight) hours as needed (breakthrough pain).  04/16/17  Yes [provider]  silver sulfADIAZINE (SILVADENE) 1 % cream Apply 1 application topically daily. 04/24/17  Yes Dondra Prader R, NP  Insulin Syringes, Disposable, U-100 0.3 ML MISC For administration of insulin TID. ICD E-11.9 10/29/16   Scot Jun, FNP  LORazepam (ATIVAN) 1 MG tablet Take 1 tablet (1 mg total) by mouth every 6 (six) hours as needed for anxiety. Patient not taking: Reported on 04/28/2017 01/30/15   [name], MD  oxyCODONE-acetaminophen (PERCOCET/ROXICET) 5-325 MG tablet Take 1 tablet by mouth every  6 (six) hours as needed for moderate pain or severe pain. Patient not taking: Reported on 04/28/2017 04/24/17   Suzan Slick, NP   Mr Foot Right W Wo Contrast  Result Date: 04/30/2017 CLINICAL DATA:  Diabetic patient with chronic skin ulcerations on the foot. Question osteomyelitis. EXAM: MRI OF THE RIGHT FOREFOOT WITHOUT AND WITH CONTRAST TECHNIQUE: Multiplanar, multisequence MR imaging of the right forefoot was performed before and after the administration of intravenous contrast. CONTRAST:  56mL MULTIHANCE GADOBENATE DIMEGLUMINE 529 MG/ML IV SOLN COMPARISON:  Plain films right foot 03/14/2017 and 04/28/2017. MRI right foot 03/15/2017. FINDINGS: Bones/Joint/Cartilage Since the prior examination, the patient developed marrow edema and enhancement throughout the first metatarsal and great toe. Edema and enhancement are most intense about the first MTP joint but the appearance is most consistent with osteomyelitis throughout. Small first MTP joint effusion is noted. The patient also has  new edema and enhancement in almost the entire second metatarsal, most intense in the head and neck of the metatarsal. Edema and enhancement are also seen throughout the proximal phalanx of the second toe. New intense edema and enhancement are seen in the proximal 2.5 cm of the fifth metatarsal. Low level edema and enhancement are seen in the third, fourth and fifth toes and head and necks of the third, fourth and fifth metatarsals. Ligaments Intact. Muscles and Tendons Edema and enhancement are seen in the intrinsic musculature of the foot. No intramuscular abscess is identified. Soft tissues A large skin wound is seen on the plantar surface of the foot between the first and second metatarsal heads. There also skin wound at the base of the fifth metatarsal. A subcutaneous fluid collection superficial to the second MTP joint measures 0.8 cm long by 0.7 cm craniocaudal by 0.5 cm transverse and is consistent with an abscess.  IMPRESSION: Markedly worsened appearance of the foot since the patient's prior MRI 03/15/2017. Findings consistent with osteomyelitis throughout almost the entire first and second rays and proximal 2.5 cm of the fifth metatarsal. Mild edema and enhancement in the third, fourth and fifth toes and distal third, fourth and fifth metatarsals could be reactive rather than due to osteomyelitis. Signal change and enhancement are worst in the distal third metatarsal and third toe. Large skin wound on the plantar surface of the foot between the first and second metatarsal heads. Second skin ulceration at the base of the fifth metatarsal is identified. Edema and enhancement in intrinsic musculature the foot compatible with myositis. Small fluid collection just superior to the second MTP joint consistent with abscess. Electronically Signed   By: Inge Rise M.D.   On: 04/30/2017 12:55   - pertinent xrays, CT, MRI studies were reviewed and independently interpreted  Positive ROS: All other systems have been reviewed and were otherwise negative with the exception of those mentioned in the HPI and as above.  Physical Exam: General: Alert, no acute distress Psychiatric: Patient is competent for consent with normal mood and affect Lymphatic: No axillary or cervical lymphadenopathy Cardiovascular: No pedal edema Respiratory: No cyanosis, no use of accessory musculature GI: No organomegaly, abdomen is soft and non-tender  Skin: Patient has dry cracked skin he has a small ulcer over the base of the fifth metatarsal right foot the wound on the plantar aspect of the foot is approximately 50% smaller and there is good granulation tissue the base of the wound.   Neurologic: Patient does not have protective sensation bilateral lower extremities.   MUSCULOSKELETAL:  Examination patient has a good dorsalis pedis pulse there is no ascending cellulitis there is no purulence no odor.  There is no exposed bone or tendon  in the wound bed.  Review the MRI scan shows a change from his previous MRI scan with osteomyelitis involving the first and second metatarsal.  Assessment: Assessment: Diabetic insensate neuropathy status post debridement of an abscess of the right foot who presents with acute osteomyelitis of the first and second metatarsals with an ulcer over the base of the fifth metatarsal.  Plan: Plan: I discussed with the patient and his family recommendation to proceed with a trans-metatarsal amputation with strict nonweightbearing postoperatively for a month.  Discussed that with extra-depth shoe wear orthotic spacer carbon plate and double upright brace patient would be able to ambulate well once the wound has healed.  Discussed that patient could undergo prolonged IV antibiotic therapy however this would not completely  eradicate the infection.  All questions were encouraged and answered.  Patient states he cannot make a decision at this time.  Recommended proceeding with surgery tomorrow for transmetatarsal amputation he will call my office if he has further questions, the family has further questions, or if he wishes to proceed with surgery.  Thank you for the consult and the opportunity to see [name], [place] [phone] 12:18 PM

## 2017-05-01 NOTE — Progress Notes (Signed)
PROGRESS NOTE  Keith [name]  VOZ:[id number] DOB: 03/18/1966 DOA: 04/28/2017 PCP: Scot Jun, FNP   Brief Narrative: Keith [name] is a 52 y.o. male with a history of HIV on ART (last CD4 210, undetectable VL), T2DM, HTN, stage III CKD, and chronic right foot osteomyelitis who presented to the ED 1/21 with worsening distal plantar pain and fever/chills concerning for worsening wound infection/osteomyelitis despite outpatient augmentin. He was tachycardic, afebrile with leukocytosis  (WBC 13.4) and XR demonstrating progressive demineralization in the first metatarsal head suggesting osteomyelitis. CRP found to be 14.9, ESR 140, lactic acid reassuringly normal. Broad IV abx started and [name] contacted for orthopedic consultation.   Assessment & Plan: Active Problems:   Osteomyelitis of metatarsal (HCC)  Sepsis due MRSA bacteremia and acute on chronic osteomyelitis of right first metatarsal with overlying cellulitis of right foot: MRI with significant worsening of osteo, myositis, and abscess. ESR 140, CRP 14.9. Has been followed closely by [name]. - Continue vancomycin.  - Monitor repeat blood cultures from 1/23. - Discussed with cardiology, will ask for TEE given pt's going forward with surgery.  - Planning TMA 1/25 per [name]. Pt amenable. Will make NPO - Pain control  HIV on ART:  - CD4 count down to 80, ID started dapsone, VL undetectable.   - Continue Triumeq, Prezcobix  IDT2DM:  - Continue lantus 20u qHS, SSI qAC/HS. CBGs well controlled. - Pt has very little insight into effects of diet. Consulted RD and diabetes coordinator  Stage III CKD: Cr appears stable to baseline.  - Monitor BMP, UOP - Renally dose medications, caution with vancomycin  Hematuria: Uncertain etiology. Hgb near baseline.  - Defer further evaluation to outpatient setting.   Protein gap: Protein gap of 5.4 present is likely secondary to HIV infection.  - Can consider further workup for  paraproteinemia in the outpatient setting.  DVT prophylaxis: Lovenox Code Status: Full Family Communication: Mother, brother at bedside Disposition Plan: Uncertain  Consultants:   Orthopedic surgery, [name]  ID  Cardiology  Procedures:   Echo 1/23 Study Conclusions - Left ventricle: The cavity size was normal. Wall thickness was   normal. Systolic function was normal. The estimated ejection   fraction was in the range of 55% to 60%. Wall motion was normal;   there were no regional wall motion abnormalities. Doppler   parameters are consistent with abnormal left ventricular   relaxation (grade 1 diastolic dysfunction). - Mitral valve: There was trivial regurgitation.  Impressions: - There was no evidence of a vegetation.  Antimicrobials:  Vancomycin 1/21 >>   Cefepime 1/21 - 1/22  Zosyn 1/22 >>    Subjective: Withdrawn, worried about amputation, still was thinking about whether to go forward at time of my assessment. Has decided to proceed since, per RN.   Objective: Vitals:   04/30/17 0539 04/30/17 1458 04/30/17 2115 05/01/17 0532  BP: 137/77 105/60 (!) 157/98 131/68  Pulse: 95 89 93 85  Resp: '18 18 18 18  '$ Temp: 98.6 F (37 C) 98.5 F (36.9 C) 98.9 F (37.2 C) 98.9 F (37.2 C)  TempSrc: Oral Oral Oral Oral  SpO2: 98% 98% 99% 98%  Weight: 110.3 kg (243 lb 2.7 oz)   110.5 kg (243 lb 9.7 oz)  Height:        Intake/Output Summary (Last 24 hours) at 05/01/2017 1359 Last data filed at 05/01/2017 1100 Gross per 24 hour  Intake 690 ml  Output 600 ml  Net 90 ml  Filed Weights   04/29/17 0500 04/30/17 0539 05/01/17 0532  Weight: 111.5 kg (245 lb 13 oz) 110.3 kg (243 lb 2.7 oz) 110.5 kg (243 lb 9.7 oz)    Gen: 52 y.o. male in no distress Pulm: Non-labored breathing room air. Clear to auscultation bilaterally. CV: Regular borderline tachycardia. No murmur, rub, or gallop. No JVD, no significant pedal edema. GI: Abdomen soft, non-tender, non-distended,  with normoactive bowel sounds. No organomegaly or masses felt. Skin: Right foot wounds not examined today. Cap refill remains brisk, 2+ DP pulse. Neuro: Alert and oriented. Decreased sensation bilateral distal LE's.  Psych: Judgement and insight appear intact.   Data Reviewed: I have personally reviewed following labs and imaging studies  CBC: Recent Labs  Lab 04/28/17 1444 04/29/17 0644 04/30/17 0923 05/01/17 0752  WBC 13.4* 11.9* 13.9* 9.6  NEUTROABS 10.3* 9.1*  --   --   HGB 9.3* 8.9* 8.9* 9.2*  HCT 28.1* 28.0* 27.3* 28.1*  MCV 85.7 85.6 84.5 83.6  PLT 363 342 389 093   Basic Metabolic Panel: Recent Labs  Lab 04/28/17 1444 04/28/17 2347 04/29/17 0640 04/30/17 0923 05/01/17 0752  NA 135  --  134* 136 136  K 3.8  --  3.6 3.5 3.3*  CL 101  --  100* 100* 99*  CO2 23  --  21* 22 24  GLUCOSE 173*  --  120* 95 99  BUN 14  --  '13 13 10  '$ CREATININE 1.56*  --  1.47* 1.56* 1.21  CALCIUM 9.2  --  8.9 9.1 9.0  MG  --  1.7  --   --   --   PHOS  --  3.1  --   --   --    GFR: Estimated Creatinine Clearance: 87.5 mL/min (by C-G formula based on SCr of 1.21 mg/dL). Liver Function Tests: Recent Labs  Lab 04/28/17 1444  AST 20  ALT 18  ALKPHOS 121  BILITOT 0.8  PROT 8.4*  ALBUMIN 3.0*   No results for input(s): LIPASE, AMYLASE in the last 168 hours. No results for input(s): AMMONIA in the last 168 hours. Coagulation Profile: Recent Labs  Lab 04/28/17 2347  INR 1.16   Cardiac Enzymes: No results for input(s): CKTOTAL, CKMB, CKMBINDEX, TROPONINI in the last 168 hours. BNP (last 3 results) No results for input(s): PROBNP in the last 8760 hours. HbA1C: No results for input(s): HGBA1C in the last 72 hours. CBG: Recent Labs  Lab 04/30/17 0623 04/30/17 1319 04/30/17 1635 04/30/17 2125 05/01/17 0630  GLUCAP 89 103* 155* 101* 117*   Lipid Profile: No results for input(s): CHOL, HDL, LDLCALC, TRIG, CHOLHDL, LDLDIRECT in the last 72 hours. Thyroid Function  Tests: No results for input(s): TSH, T4TOTAL, FREET4, T3FREE, THYROIDAB in the last 72 hours. Anemia Panel: No results for input(s): VITAMINB12, FOLATE, FERRITIN, TIBC, IRON, RETICCTPCT in the last 72 hours. Urine analysis:    Component Value Date/Time   COLORURINE YELLOW 04/29/2017 0438   APPEARANCEUR HAZY (A) 04/29/2017 0438   LABSPEC 1.018 04/29/2017 0438   PHURINE 5.0 04/29/2017 0438   GLUCOSEU NEGATIVE 04/29/2017 0438   HGBUR SMALL (A) 04/29/2017 0438   BILIRUBINUR NEGATIVE 04/29/2017 0438   KETONESUR NEGATIVE 04/29/2017 0438   PROTEINUR 100 (A) 04/29/2017 0438   UROBILINOGEN 1.0 01/01/2017 1032   NITRITE NEGATIVE 04/29/2017 0438   LEUKOCYTESUR NEGATIVE 04/29/2017 0438   Recent Results (from the past 240 hour(s))  Culture, blood (routine x 2)     Status: Abnormal   Collection  Time: 04/29/17 12:00 AM  Result Value Ref Range Status   Specimen Description BLOOD RIGHT ANTECUBITAL  Final   Special Requests IN PEDIATRIC BOTTLE Blood Culture adequate volume  Final   Culture  Setup Time   Final    GRAM POSITIVE COCCI IN CLUSTERS IN PEDIATRIC BOTTLE CRITICAL RESULT CALLED TO, READ BACK BY AND VERIFIED WITH: J. CARNEY, RPHARMD AT 1810 ON 04/29/17 BY C. JESSUP, MLT.    Culture (A)  Final    STAPHYLOCOCCUS AUREUS SUSCEPTIBILITIES PERFORMED ON PREVIOUS CULTURE WITHIN THE LAST 5 DAYS.    Report Status 05/01/2017 FINAL  Final  Culture, blood (routine x 2)     Status: Abnormal   Collection Time: 04/29/17 12:08 AM  Result Value Ref Range Status   Specimen Description BLOOD RIGHT ANTECUBITAL  Final   Special Requests IN PEDIATRIC BOTTLE Blood Culture adequate volume  Final   Culture  Setup Time   Final    GRAM POSITIVE COCCI IN PEDIATRIC BOTTLE CRITICAL RESULT CALLED TO, READ BACK BY AND VERIFIED WITH: J. CARNEY, RPHARMD AT 1810 ON 04/29/17 BY C. JESSUP, MLT.    Culture METHICILLIN RESISTANT STAPHYLOCOCCUS AUREUS (A)  Final   Report Status 05/01/2017 FINAL  Final   Organism ID,  Bacteria METHICILLIN RESISTANT STAPHYLOCOCCUS AUREUS  Final      Susceptibility   Methicillin resistant staphylococcus aureus - MIC*    CIPROFLOXACIN >=8 RESISTANT Resistant     ERYTHROMYCIN >=8 RESISTANT Resistant     GENTAMICIN <=0.5 SENSITIVE Sensitive     OXACILLIN >=4 RESISTANT Resistant     TETRACYCLINE <=1 SENSITIVE Sensitive     VANCOMYCIN 1 SENSITIVE Sensitive     TRIMETH/SULFA <=10 SENSITIVE Sensitive     CLINDAMYCIN <=0.25 SENSITIVE Sensitive     RIFAMPIN <=0.5 SENSITIVE Sensitive     Inducible Clindamycin NEGATIVE Sensitive     * METHICILLIN RESISTANT STAPHYLOCOCCUS AUREUS  Blood Culture ID Panel (Reflexed)     Status: Abnormal   Collection Time: 04/29/17 12:08 AM  Result Value Ref Range Status   Enterococcus species NOT DETECTED NOT DETECTED Final   Listeria monocytogenes NOT DETECTED NOT DETECTED Final   Staphylococcus species DETECTED (A) NOT DETECTED Final    Comment: CRITICAL RESULT CALLED TO, READ BACK BY AND VERIFIED WITH: J. CARNEY, RPHARMD AT 1810 ON 04/29/17 BY C. JESSUP, MLT.    Staphylococcus aureus DETECTED (A) NOT DETECTED Final    Comment: Methicillin (oxacillin)-resistant Staphylococcus aureus (MRSA). MRSA is predictably resistant to beta-lactam antibiotics (except ceftaroline). Preferred therapy is vancomycin unless clinically contraindicated. Patient requires contact precautions if  hospitalized. CRITICAL RESULT CALLED TO, READ BACK BY AND VERIFIED WITH: J. CARNEY, RPHARMD AT 1810 ON 04/29/17 BY C. JESSUP, MLT.    Methicillin resistance DETECTED (A) NOT DETECTED Final    Comment: CRITICAL RESULT CALLED TO, READ BACK BY AND VERIFIED WITH: J. CARNEY, RPHARMD AT 1810 ON 04/29/17 BY C. JESSUP, MLT.    Streptococcus species NOT DETECTED NOT DETECTED Final   Streptococcus agalactiae NOT DETECTED NOT DETECTED Final   Streptococcus pneumoniae NOT DETECTED NOT DETECTED Final   Streptococcus pyogenes NOT DETECTED NOT DETECTED Final   Acinetobacter baumannii NOT  DETECTED NOT DETECTED Final   Enterobacter cloacae complex NOT DETECTED NOT DETECTED Final   Escherichia coli NOT DETECTED NOT DETECTED Final   Klebsiella oxytoca NOT DETECTED NOT DETECTED Final   Klebsiella pneumoniae NOT DETECTED NOT DETECTED Final   Serratia marcescens NOT DETECTED NOT DETECTED Final   Carbapenem resistance NOT DETECTED NOT DETECTED Final  Haemophilus influenzae NOT DETECTED NOT DETECTED Final   Neisseria meningitidis NOT DETECTED NOT DETECTED Final   Pseudomonas aeruginosa NOT DETECTED NOT DETECTED Final   Candida albicans NOT DETECTED NOT DETECTED Final   Candida glabrata NOT DETECTED NOT DETECTED Final   Candida krusei NOT DETECTED NOT DETECTED Final   Candida parapsilosis NOT DETECTED NOT DETECTED Final   Candida tropicalis NOT DETECTED NOT DETECTED Final  MRSA PCR Screening     Status: Abnormal   Collection Time: 04/29/17  4:38 AM  Result Value Ref Range Status   MRSA by PCR POSITIVE (A) NEGATIVE Final    Comment:        The GeneXpert MRSA Assay (FDA approved for NASAL specimens only), is one component of a comprehensive MRSA colonization surveillance program. It is not intended to diagnose MRSA infection nor to guide or monitor treatment for MRSA infections. RESULT CALLED TO, READ BACK BY AND VERIFIED WITH: RN R Arlester Marker [id number]   Wound or Superficial Culture     Status: None   Collection Time: 04/29/17 12:10 PM  Result Value Ref Range Status   Specimen Description WOUND FOOT  Final   Special Requests NONE  Final   Gram Stain   Final    NO WBC SEEN RARE GRAM POSITIVE COCCI IN PAIRS IN SINGLES    Culture FEW METHICILLIN RESISTANT STAPHYLOCOCCUS AUREUS  Final   Report Status 05/01/2017 FINAL  Final   Organism ID, Bacteria METHICILLIN RESISTANT STAPHYLOCOCCUS AUREUS  Final      Susceptibility   Methicillin resistant staphylococcus aureus - MIC*    CIPROFLOXACIN >=8 RESISTANT Resistant     ERYTHROMYCIN >=8 RESISTANT Resistant     GENTAMICIN  <=0.5 SENSITIVE Sensitive     OXACILLIN >=4 RESISTANT Resistant     TETRACYCLINE <=1 SENSITIVE Sensitive     VANCOMYCIN 1 SENSITIVE Sensitive     TRIMETH/SULFA <=10 SENSITIVE Sensitive     CLINDAMYCIN <=0.25 SENSITIVE Sensitive     RIFAMPIN <=0.5 SENSITIVE Sensitive     Inducible Clindamycin NEGATIVE Sensitive     * FEW METHICILLIN RESISTANT STAPHYLOCOCCUS AUREUS  Culture, blood (routine x 2)     Status: None (Preliminary result)   Collection Time: 04/30/17  9:20 AM  Result Value Ref Range Status   Specimen Description BLOOD RIGHT HAND  Final   Special Requests IN PEDIATRIC BOTTLE Blood Culture adequate volume  Final   Culture  Setup Time   Final    GRAM POSITIVE COCCI IN PEDIATRIC BOTTLE CRITICAL RESULT CALLED TO, READ BACK BY AND VERIFIED WITH: PARKEY RN AT 1141 ON [id number] BY SJW    Culture GRAM POSITIVE COCCI  Final   Report Status PENDING  Incomplete  Culture, blood (routine x 2)     Status: None (Preliminary result)   Collection Time: 04/30/17  9:27 AM  Result Value Ref Range Status   Specimen Description BLOOD RIGHT HAND  Final   Special Requests IN PEDIATRIC BOTTLE Blood Culture adequate volume  Final   Culture NO GROWTH < 24 HOURS  Final   Report Status PENDING  Incomplete      Radiology Studies: Mr Foot Right W Wo Contrast  Result Date: 04/30/2017 CLINICAL DATA:  Diabetic patient with chronic skin ulcerations on the foot. Question osteomyelitis. EXAM: MRI OF THE RIGHT FOREFOOT WITHOUT AND WITH CONTRAST TECHNIQUE: Multiplanar, multisequence MR imaging of the right forefoot was performed before and after the administration of intravenous contrast. CONTRAST:  31m MULTIHANCE GADOBENATE DIMEGLUMINE 529 MG/ML IV SOLN  COMPARISON:  Plain films right foot 03/14/2017 and 04/28/2017. MRI right foot 03/15/2017. FINDINGS: Bones/Joint/Cartilage Since the prior examination, the patient developed marrow edema and enhancement throughout the first metatarsal and great toe. Edema and  enhancement are most intense about the first MTP joint but the appearance is most consistent with osteomyelitis throughout. Small first MTP joint effusion is noted. The patient also has new edema and enhancement in almost the entire second metatarsal, most intense in the head and neck of the metatarsal. Edema and enhancement are also seen throughout the proximal phalanx of the second toe. New intense edema and enhancement are seen in the proximal 2.5 cm of the fifth metatarsal. Low level edema and enhancement are seen in the third, fourth and fifth toes and head and necks of the third, fourth and fifth metatarsals. Ligaments Intact. Muscles and Tendons Edema and enhancement are seen in the intrinsic musculature of the foot. No intramuscular abscess is identified. Soft tissues A large skin wound is seen on the plantar surface of the foot between the first and second metatarsal heads. There also skin wound at the base of the fifth metatarsal. A subcutaneous fluid collection superficial to the second MTP joint measures 0.8 cm long by 0.7 cm craniocaudal by 0.5 cm transverse and is consistent with an abscess. IMPRESSION: Markedly worsened appearance of the foot since the patient's prior MRI 03/15/2017. Findings consistent with osteomyelitis throughout almost the entire first and second rays and proximal 2.5 cm of the fifth metatarsal. Mild edema and enhancement in the third, fourth and fifth toes and distal third, fourth and fifth metatarsals could be reactive rather than due to osteomyelitis. Signal change and enhancement are worst in the distal third metatarsal and third toe. Large skin wound on the plantar surface of the foot between the first and second metatarsal heads. Second skin ulceration at the base of the fifth metatarsal is identified. Edema and enhancement in intrinsic musculature the foot compatible with myositis. Small fluid collection just superior to the second MTP joint consistent with abscess.  Electronically Signed   By: Inge Rise M.D.   On: 04/30/2017 12:55    Scheduled Meds: . abacavir-dolutegravir-lamiVUDine  1 tablet Oral QHS  . dapsone  100 mg Oral Daily  . darunavir-cobicistat  1 tablet Oral Q breakfast  . docusate sodium  100 mg Oral BID  . enoxaparin (LOVENOX) injection  55 mg Subcutaneous Daily  . insulin aspart  0-15 Units Subcutaneous TID WC  . insulin aspart  0-5 Units Subcutaneous QHS  . insulin glargine  20 Units Subcutaneous Q2200  . polyethylene glycol  17 g Oral Daily  . senna  1 tablet Oral BID   Continuous Infusions: . vancomycin 750 mg (05/01/17 1230)     LOS: 3 days   Time spent: 25 minutes.  Vance Gather, MD [facility] Pager [phone]  If 7PM-7AM, please contact night-coverage [email or web address] Password [facility] 05/01/2017, 1:59 PM

## 2017-05-01 NOTE — Progress Notes (Signed)
Pharmacy Antibiotic Note  Keith [name] is a 52 y.o. male admitted on 04/28/2017 with foot pain, found to have osteomyelitis and subsequent MRSA bacteremia.  ECHO normal and Ortho plans for amputation tomorrow.  Pharmacy has been consulted for vancomycin dosing.  Patient's renal function is stable and his vancomycin trough is therapeutic.  He is now afebrile and his WBC is up to 13.9.     Plan: Continue vanc 750mg  IV Q12H Monitor renal fxn, clinical progress, repeat vanc trough as indicated    Height: 5\' 9"  (175.3 cm) Weight: 243 lb 9.7 oz (110.5 kg) IBW/kg (Calculated) : 70.7  Temp (24hrs), Avg:98.8 F (37.1 C), Min:98.5 F (36.9 C), Max:98.9 F (37.2 C)  Recent Labs  Lab 04/28/17 1444 04/28/17 1501 04/28/17 2027 04/29/17 0640 04/29/17 0644 04/29/17 0726 04/30/17 0923 05/01/17 0752 05/01/17 1108  WBC 13.4*  --   --   --  11.9*  --  13.9* 9.6  --   CREATININE 1.56*  --   --  1.47*  --   --  1.56* 1.21  --   LATICACIDVEN  --  0.98 1.39  --   --  0.6  --   --   --   VANCOTROUGH  --   --   --   --   --   --   --   --  16    Estimated Creatinine Clearance: 87.5 mL/min (by C-G formula based on SCr of 1.21 mg/dL).    Allergies  Allergen Reactions  . Sulfa Antibiotics Itching     HIV on Triumeq, Prezcobix - CD4 210  Vanc 1/21 >> Cefepime 1/21 >>1/22 Zosyn 1/22>>1/22 Augmentin PTA  1/24 VT = 16 mcg/mL on 750mg  q12 >> no change  1/22 MRSA PCR - positive 1/22 BCx - Staph aureus (BCID MRSA) 1/22 foot wound fx (superficial) - Staph aureus    Zian Delair D. Mina Marble, PharmD, BCPS Pager:  [phone] 05/01/2017, 12:48 PM

## 2017-05-01 NOTE — Consult Note (Signed)
ORTHOPAEDIC CONSULTATION  REQUESTING PHYSICIAN: Patrecia Pour, MD  Chief Complaint: Pain ulceration right foot.  HPI: Keith [name] is a 52 y.o. male who presents status post debridement of abscess plantar aspect of the right foot.  This wound is healing and has good granulation tissue however patient developed increasing pain in the first and second metatarsal of the forefoot.  Past Medical History:  Diagnosis Date  . AIDS ([place])   . Chronic knee pain    right  . Chronic pain   . Diabetes mellitus   . Diabetes type 2, uncontrolled ([place])    HgA1c 17.6 (04/27/2010)  . Diabetic foot ulcer ([place]) 01/2017   right foot  . Erectile dysfunction   . Genital warts   . HIV (human immunodeficiency virus infection) ([place]) 2009   CD4 count 100, VL 13800 (05/01/2010)  . Hypertension   . Osteomyelitis [facility])    h/o hand   Past Surgical History:  Procedure Laterality Date  . HERNIA REPAIR    . I&D EXTREMITY Left 08/21/2014   Procedure: INCISION AND DRAINAGE LEFT SMALL FINGER;  Surgeon: Leanora Cover, MD;  Location: [place];  Service: Orthopedics;  Laterality: Left;  . I&D EXTREMITY Right 03/18/2017   Procedure: IRRIGATION AND DEBRIDEMENT EXTREMITY;  Surgeon: [name], MD;  Location: [place];  Service: Orthopedics;  Laterality: Right;  . MINOR IRRIGATION AND DEBRIDEMENT OF WOUND Right 04/22/2014   Procedure: IRRIGATION AND DEBRIDEMENT OF RIGHT NECK ABCESS;  Surgeon: Jerrell Belfast, MD;  Location: [place];  Service: ENT;  Laterality: Right;  . MULTIPLE EXTRACTIONS WITH ALVEOLOPLASTY N/A 01/18/2013   Procedure: MULTIPLE EXTRACION 3, 6, 7, 10, 11, 13, 21, 22, 27, 28, 29, 30 WITH ALVEOLOPLASTY;  Surgeon: Gae Bon, DDS;  Location: [place];  Service: Oral Surgery;  Laterality: N/A;  . SKIN SPLIT GRAFT Right 03/21/2017   Procedure: IRRIGATION AND DEBRIDEMENT RIGHT FOOT AND APPLY SPLIT THICKNESS SKIN GRAFT AND WOUND VAC;  Surgeon: [name], MD;  Location: [place];  Service: Orthopedics;   Laterality: Right;   Social History   Socioeconomic History  . Marital status: Single    Spouse name: None  . Number of children: 13  . Years of education: 50  . Highest education level: None  Social Needs  . Financial resource strain: None  . Food insecurity - worry: None  . Food insecurity - inability: None  . Transportation needs - medical: None  . Transportation needs - non-medical: None  Occupational History  . None  Tobacco Use  . Smoking status: Never Smoker  . Smokeless tobacco: Never Used  Substance and Sexual Activity  . Alcohol use: No    Alcohol/week: 0.0 oz  . Drug use: No  . Sexual activity: Yes    Partners: Female    Comment: given condoms  Other Topics Concern  . None  Social History Narrative   Worked for the city of [place] for 18 years.   Unemployed.    Applying for disability.   Medicaid patient.   Family History  Problem Relation Age of Onset  . Hypertension Mother   . Arthritis Father   . Hypertension Father   . Hypertension Brother   . Cancer Maternal Grandmother 23       unknown type of cancer  . Depression Paternal Grandmother    - negative except otherwise stated in the family history section Allergies  Allergen Reactions  . Sulfa Antibiotics Itching   Prior to Admission medications  Medication Sig Start Date End Date Taking? Authorizing Provider  abacavir-dolutegravir-lamiVUDine (TRIUMEQ) 600-50-300 MG tablet Take 1 tablet by mouth daily. Patient taking differently: Take 1 tablet by mouth at bedtime.  01/20/17  Yes [name], MD  amLODipine (NORVASC) 10 MG tablet Take 1 tablet (10 mg total) by mouth daily. Patient taking differently: Take 10 mg by mouth at bedtime.  01/17/17  Yes Nita Sells, MD  amoxicillin-clavulanate (AUGMENTIN) 875-125 MG tablet Take 1 tablet by mouth 2 (two) times daily. Patient taking differently: Take 1 tablet by mouth 2 (two) times daily. continuous 04/15/17  Yes [name], MD    cyclobenzaprine (FLEXERIL) 10 MG tablet Take 1 tablet (10 mg total) by mouth 2 (two) times daily as needed for muscle spasms. 10/25/16  Yes Neese, [place], NP  darunavir-cobicistat (PREZCOBIX) 800-150 MG tablet Take 1 tablet by mouth daily. Swallow whole. Do NOT crush, break or chew tablets. Take with food. 01/20/17  Yes [name], MD  glimepiride (AMARYL) 1 MG tablet Take 1 tablet (1 mg total) by mouth every morning. Patient taking differently: Take 1 mg by mouth daily.  01/16/17 01/16/18 Yes Nita Sells, MD  Insulin Glargine (LANTUS SOLOSTAR) 100 UNIT/ML Solostar Pen Inject 20 Units into the skin daily at 10 pm. 01/16/17  Yes Nita Sells, MD  lisinopril (PRINIVIL,ZESTRIL) 5 MG tablet Take 5 mg by mouth daily. 04/23/17  Yes [provider]  LORazepam (ATIVAN) 1 MG tablet Take 1 tablet (1 mg total) by mouth every 6 (six) hours as needed for anxiety. Patient taking differently: Take 1 mg by mouth 3 (three) times daily.  03/24/17  Yes Alma Friendly, MD  naloxone [facility]) nasal spray 4 mg/0.1 mL Place 1 spray into the nose daily as needed (opioid overdose).   Yes [provider]  Oxycodone HCl 10 MG TABS Take 10 mg by mouth 3 (three) times daily. 04/22/17  Yes [provider]  oxyCODONE-acetaminophen (PERCOCET) 10-325 MG tablet Take 1 tablet by mouth every 8 (eight) hours as needed (breakthrough pain).  04/16/17  Yes [provider]  silver sulfADIAZINE (SILVADENE) 1 % cream Apply 1 application topically daily. 04/24/17  Yes Dondra Prader R, NP  Insulin Syringes, Disposable, U-100 0.3 ML MISC For administration of insulin TID. ICD E-11.9 10/29/16   Scot Jun, FNP  LORazepam (ATIVAN) 1 MG tablet Take 1 tablet (1 mg total) by mouth every 6 (six) hours as needed for anxiety. Patient not taking: Reported on 04/28/2017 01/30/15   [name], MD  oxyCODONE-acetaminophen (PERCOCET/ROXICET) 5-325 MG tablet Take 1 tablet by mouth every  6 (six) hours as needed for moderate pain or severe pain. Patient not taking: Reported on 04/28/2017 04/24/17   Suzan Slick, NP   Mr Foot Right W Wo Contrast  Result Date: 04/30/2017 CLINICAL DATA:  Diabetic patient with chronic skin ulcerations on the foot. Question osteomyelitis. EXAM: MRI OF THE RIGHT FOREFOOT WITHOUT AND WITH CONTRAST TECHNIQUE: Multiplanar, multisequence MR imaging of the right forefoot was performed before and after the administration of intravenous contrast. CONTRAST:  39mL MULTIHANCE GADOBENATE DIMEGLUMINE 529 MG/ML IV SOLN COMPARISON:  Plain films right foot 03/14/2017 and 04/28/2017. MRI right foot 03/15/2017. FINDINGS: Bones/Joint/Cartilage Since the prior examination, the patient developed marrow edema and enhancement throughout the first metatarsal and great toe. Edema and enhancement are most intense about the first MTP joint but the appearance is most consistent with osteomyelitis throughout. Small first MTP joint effusion is noted. The patient also has  new edema and enhancement in almost the entire second metatarsal, most intense in the head and neck of the metatarsal. Edema and enhancement are also seen throughout the proximal phalanx of the second toe. New intense edema and enhancement are seen in the proximal 2.5 cm of the fifth metatarsal. Low level edema and enhancement are seen in the third, fourth and fifth toes and head and necks of the third, fourth and fifth metatarsals. Ligaments Intact. Muscles and Tendons Edema and enhancement are seen in the intrinsic musculature of the foot. No intramuscular abscess is identified. Soft tissues A large skin wound is seen on the plantar surface of the foot between the first and second metatarsal heads. There also skin wound at the base of the fifth metatarsal. A subcutaneous fluid collection superficial to the second MTP joint measures 0.8 cm long by 0.7 cm craniocaudal by 0.5 cm transverse and is consistent with an abscess.  IMPRESSION: Markedly worsened appearance of the foot since the patient's prior MRI 03/15/2017. Findings consistent with osteomyelitis throughout almost the entire first and second rays and proximal 2.5 cm of the fifth metatarsal. Mild edema and enhancement in the third, fourth and fifth toes and distal third, fourth and fifth metatarsals could be reactive rather than due to osteomyelitis. Signal change and enhancement are worst in the distal third metatarsal and third toe. Large skin wound on the plantar surface of the foot between the first and second metatarsal heads. Second skin ulceration at the base of the fifth metatarsal is identified. Edema and enhancement in intrinsic musculature the foot compatible with myositis. Small fluid collection just superior to the second MTP joint consistent with abscess. Electronically Signed   By: Inge Rise M.D.   On: 04/30/2017 12:55   - pertinent xrays, CT, MRI studies were reviewed and independently interpreted  Positive ROS: All other systems have been reviewed and were otherwise negative with the exception of those mentioned in the HPI and as above.  Physical Exam: General: Alert, no acute distress Psychiatric: Patient is competent for consent with normal mood and affect Lymphatic: No axillary or cervical lymphadenopathy Cardiovascular: No pedal edema Respiratory: No cyanosis, no use of accessory musculature GI: No organomegaly, abdomen is soft and non-tender  Skin: Patient has dry cracked skin he has a small ulcer over the base of the fifth metatarsal right foot the wound on the plantar aspect of the foot is approximately 50% smaller and there is good granulation tissue the base of the wound.   Neurologic: Patient does not have protective sensation bilateral lower extremities.   MUSCULOSKELETAL:  Examination patient has a good dorsalis pedis pulse there is no ascending cellulitis there is no purulence no odor.  There is no exposed bone or tendon  in the wound bed.  Review the MRI scan shows a change from his previous MRI scan with osteomyelitis involving the first and second metatarsal.  Assessment: Assessment: Diabetic insensate neuropathy status post debridement of an abscess of the right foot who presents with acute osteomyelitis of the first and second metatarsals with an ulcer over the base of the fifth metatarsal.  Plan: Plan: I discussed with the patient and his family recommendation to proceed with a trans-metatarsal amputation with strict nonweightbearing postoperatively for a month.  Discussed that with extra-depth shoe wear orthotic spacer carbon plate and double upright brace patient would be able to ambulate well once the wound has healed.  Discussed that patient could undergo prolonged IV antibiotic therapy however this would not completely  eradicate the infection.  All questions were encouraged and answered.  Patient states he cannot make a decision at this time.  Recommended proceeding with surgery tomorrow for transmetatarsal amputation he will call my office if he has further questions, the family has further questions, or if he wishes to proceed with surgery.  Thank you for the consult and the opportunity to see Mr. Gaylord Seydel, [place] [phone] 12:18 PM

## 2017-05-01 NOTE — Progress Notes (Signed)
[facility] for Infectious Disease    Date of Admission:  04/28/2017     ID: Keith [name] is a 52 y.o. male with HTN, CKD III, DM, and HIV admitted with progressive chronic right first metatarsal osteomyelitis despite outpatient therapy with Augmentin. Now with MRSA bacteremia. Echocardiogram with minimal mitral regurg, no vegetations.   Antibiotics: 1/21 IV Vanc >>  1/21 IV Cefepime >> dc'd 1/22 1/22 IV Zosyn x 1 dose >> dc'd 1/22  Cultures: 1/22 Blood Cx >> MRSA 1/22 Wound Cx >> few staph aureus  1/23 Blood Cx >> NG x 24 hours  Subjective: Sleeping comfortably this AM. Afebrile and hemodynamically stable. Leukocytosis resolved. Awaiting ortho consultation.   Medications:  . abacavir-dolutegravir-lamiVUDine  1 tablet Oral QHS  . darunavir-cobicistat  1 tablet Oral Q breakfast  . docusate sodium  100 mg Oral BID  . enoxaparin (LOVENOX) injection  55 mg Subcutaneous Daily  . insulin aspart  0-15 Units Subcutaneous TID WC  . insulin aspart  0-5 Units Subcutaneous QHS  . insulin glargine  20 Units Subcutaneous Q2200  . polyethylene glycol  17 g Oral Daily  . senna  1 tablet Oral BID    Objective: Vital signs in last 24 hours: Temp:  [98.5 F (36.9 C)-98.9 F (37.2 C)] 98.9 F (37.2 C) (01/24 0532) Pulse Rate:  [85-93] 85 (01/24 0532) Resp:  [18] 18 (01/24 0532) BP: (105-157)/(60-98) 131/68 (01/24 0532) SpO2:  [98 %-99 %] 98 % (01/24 0532) Weight:  [243 lb 9.7 oz (110.5 kg)] 243 lb 9.7 oz (110.5 kg) (01/24 0532)  Constitutional: NAD, appears comfortable HEENT: Atraumatic, normocephalic. PERRL, anicteric sclera.  Neck: Supple, trachea midline.  Cardiovascular: RRR, no murmurs, rubs, or gallops.  Pulmonary/Chest: CTAB, no wheezes, rales, or rhonchi.  Abdominal: Soft, non tender, non distended. +BS.  Extremities: Warm and well perfused. Right foot well wrapped with some serosanguinous drainage seeping through, painful to palpation.  Neurological: A&Ox3, CN II -  XII grossly intact.  Skin: No rashes or erythema  Psychiatric: Normal mood and affect  Lab Results Recent Labs    04/29/17 0640 04/29/17 0644 04/30/17 0923  WBC  --  11.9* 13.9*  HGB  --  8.9* 8.9*  HCT  --  28.0* 27.3*  NA 134*  --  136  K 3.6  --  3.5  CL 100*  --  100*  CO2 21*  --  22  BUN 13  --  13  CREATININE 1.47*  --  1.56*   Liver Panel Recent Labs    04/28/17 1444  PROT 8.4*  ALBUMIN 3.0*  AST 20  ALT 18  ALKPHOS 121  BILITOT 0.8   Sedimentation Rate Recent Labs    04/28/17 2347  ESRSEDRATE 140*   C-Reactive Protein Recent Labs    04/28/17 2347  CRP 14.9*    Microbiology:  Studies/Results: Mr Foot Right W Wo Contrast  Result Date: 04/30/2017 CLINICAL DATA:  Diabetic patient with chronic skin ulcerations on the foot. Question osteomyelitis. EXAM: MRI OF THE RIGHT FOREFOOT WITHOUT AND WITH CONTRAST TECHNIQUE: Multiplanar, multisequence MR imaging of the right forefoot was performed before and after the administration of intravenous contrast. CONTRAST:  35mL MULTIHANCE GADOBENATE DIMEGLUMINE 529 MG/ML IV SOLN COMPARISON:  Plain films right foot 03/14/2017 and 04/28/2017. MRI right foot 03/15/2017. FINDINGS: Bones/Joint/Cartilage Since the prior examination, the patient developed marrow edema and enhancement throughout the first metatarsal and great toe. Edema and enhancement are most intense about the first MTP joint  but the appearance is most consistent with osteomyelitis throughout. Small first MTP joint effusion is noted. The patient also has new edema and enhancement in almost the entire second metatarsal, most intense in the head and neck of the metatarsal. Edema and enhancement are also seen throughout the proximal phalanx of the second toe. New intense edema and enhancement are seen in the proximal 2.5 cm of the fifth metatarsal. Low level edema and enhancement are seen in the third, fourth and fifth toes and head and necks of the third, fourth and  fifth metatarsals. Ligaments Intact. Muscles and Tendons Edema and enhancement are seen in the intrinsic musculature of the foot. No intramuscular abscess is identified. Soft tissues A large skin wound is seen on the plantar surface of the foot between the first and second metatarsal heads. There also skin wound at the base of the fifth metatarsal. A subcutaneous fluid collection superficial to the second MTP joint measures 0.8 cm long by 0.7 cm craniocaudal by 0.5 cm transverse and is consistent with an abscess. IMPRESSION: Markedly worsened appearance of the foot since the patient's prior MRI 03/15/2017. Findings consistent with osteomyelitis throughout almost the entire first and second rays and proximal 2.5 cm of the fifth metatarsal. Mild edema and enhancement in the third, fourth and fifth toes and distal third, fourth and fifth metatarsals could be reactive rather than due to osteomyelitis. Signal change and enhancement are worst in the distal third metatarsal and third toe. Large skin wound on the plantar surface of the foot between the first and second metatarsal heads. Second skin ulceration at the base of the fifth metatarsal is identified. Edema and enhancement in intrinsic musculature the foot compatible with myositis. Small fluid collection just superior to the second MTP joint consistent with abscess. Electronically Signed   By: Inge Rise M.D.   On: 04/30/2017 12:55    Assessment/Plan: Continue IV vancomycin. Appreciate orthopedic consult. Pending plans for intervention, will decide on TEE. If no amputation, will need 6 weeks of IV abx regardless. Interestingly, viral load checked on admission resulted undetectable but CD4 count is 80. It is possible there was a lapse in compliance contributing to this infection and CD4 count has not yet recovered. Will start dapsone for PCP prophylaxis (allergy to bactrim).  [facility] for Infectious Diseases Cell:  [phone] Pager: [phone]  05/01/2017, 8:52 AM

## 2017-05-01 NOTE — Telephone Encounter (Signed)
Claiborne Billings, a nurse from [place] @ [facility] called stating that the patient does want to go ahead with the surgery.  CB#[phone].  Thank you

## 2017-05-02 ENCOUNTER — Encounter (HOSPITAL_COMMUNITY): Payer: Self-pay

## 2017-05-02 ENCOUNTER — Inpatient Hospital Stay (HOSPITAL_COMMUNITY): Payer: Medicare Other | Admitting: Anesthesiology

## 2017-05-02 ENCOUNTER — Encounter (HOSPITAL_COMMUNITY)
Admission: EM | Disposition: A | Discharge status: Home Health | Payer: Self-pay | Source: Home / Self Care | Attending: Family Medicine

## 2017-05-02 ENCOUNTER — Inpatient Hospital Stay (HOSPITAL_COMMUNITY): Payer: Medicare Other

## 2017-05-02 DIAGNOSIS — R7881 Bacteremia: Secondary | ICD-10-CM

## 2017-05-02 DIAGNOSIS — I330 Acute and subacute infective endocarditis: Secondary | ICD-10-CM

## 2017-05-02 DIAGNOSIS — L089 Local infection of the skin and subcutaneous tissue, unspecified: Secondary | ICD-10-CM

## 2017-05-02 DIAGNOSIS — T148XXA Other injury of unspecified body region, initial encounter: Secondary | ICD-10-CM

## 2017-05-02 DIAGNOSIS — E1142 Type 2 diabetes mellitus with diabetic polyneuropathy: Secondary | ICD-10-CM

## 2017-05-02 DIAGNOSIS — M86171 Other acute osteomyelitis, right ankle and foot: Secondary | ICD-10-CM

## 2017-05-02 HISTORY — PX: AMPUTATION: SHX166

## 2017-05-02 HISTORY — PX: TEE WITHOUT CARDIOVERSION: SHX5443

## 2017-05-02 LAB — GLUCOSE, CAPILLARY
Glucose-Capillary: 146 mg/dL — ABNORMAL HIGH (ref 65–99)
Glucose-Capillary: 103 mg/dL — ABNORMAL HIGH (ref 65–99)
Glucose-Capillary: 108 mg/dL — ABNORMAL HIGH (ref 65–99)
Glucose-Capillary: 118 mg/dL — ABNORMAL HIGH (ref 65–99)
Glucose-Capillary: 163 mg/dL — ABNORMAL HIGH (ref 65–99)

## 2017-05-02 LAB — CULTURE, BLOOD (ROUTINE X 2): Special Requests: ADEQUATE

## 2017-05-02 SURGERY — AMPUTATION, FOOT, PARTIAL
Anesthesia: General | Site: Foot | Laterality: Right

## 2017-05-02 MED ORDER — SUGAMMADEX SODIUM 500 MG/5ML IV SOLN
INTRAVENOUS | Status: DC | PRN
  Administered 2017-05-02: 221 mg via INTRAVENOUS

## 2017-05-02 MED ORDER — METHOCARBAMOL 1000 MG/10ML IJ SOLN: 500.0000 mg | Freq: Four times a day (QID) | INTRAVENOUS | Status: DC | PRN

## 2017-05-02 MED ORDER — MAGNESIUM CITRATE PO SOLN: 1.0000 | Freq: Once | ORAL | Status: DC | PRN

## 2017-05-02 MED ORDER — MIDAZOLAM HCL 2 MG/2ML IJ SOLN
INTRAMUSCULAR | Status: AC
  Filled 2017-05-02: qty 2

## 2017-05-02 MED ORDER — FENTANYL CITRATE (PF) 250 MCG/5ML IJ SOLN
INTRAMUSCULAR | Status: DC | PRN
  Administered 2017-05-02: 100 ug via INTRAVENOUS

## 2017-05-02 MED ORDER — SODIUM CHLORIDE 0.9 % IV SOLN: INTRAVENOUS | Status: DC

## 2017-05-02 MED ORDER — CEFAZOLIN SODIUM-DEXTROSE 2-4 GM/100ML-% IV SOLN
2.0000 g | INTRAVENOUS | Status: AC
  Administered 2017-05-02: 2 g via INTRAVENOUS
  Filled 2017-05-02: qty 100

## 2017-05-02 MED ORDER — ONDANSETRON HCL 4 MG PO TABS: 4.0000 mg | ORAL_TABLET | Freq: Four times a day (QID) | ORAL | Status: DC | PRN

## 2017-05-02 MED ORDER — METOCLOPRAMIDE HCL 5 MG/ML IJ SOLN: 5.0000 mg | Freq: Three times a day (TID) | INTRAMUSCULAR | Status: DC | PRN

## 2017-05-02 MED ORDER — METHOCARBAMOL 500 MG PO TABS
500.0000 mg | ORAL_TABLET | Freq: Four times a day (QID) | ORAL | Status: DC | PRN
  Administered 2017-05-02 – 2017-05-06 (×5): 500 mg via ORAL
  Filled 2017-05-02 (×5): qty 1

## 2017-05-02 MED ORDER — SODIUM CHLORIDE 0.9 % IV SOLN
INTRAVENOUS | Status: DC
  Administered 2017-05-02: 23:00:00 via INTRAVENOUS

## 2017-05-02 MED ORDER — PROPOFOL 10 MG/ML IV BOLUS
INTRAVENOUS | Status: DC | PRN
  Administered 2017-05-02: 200 mg via INTRAVENOUS
  Administered 2017-05-02: 20 mg via INTRAVENOUS

## 2017-05-02 MED ORDER — LACTATED RINGERS IV SOLN
INTRAVENOUS | Status: DC | PRN
  Administered 2017-05-02: 12:00:00 via INTRAVENOUS

## 2017-05-02 MED ORDER — HYDROMORPHONE HCL 1 MG/ML IJ SOLN: 0.2500 mg | INTRAMUSCULAR | Status: DC | PRN

## 2017-05-02 MED ORDER — LIDOCAINE HCL (CARDIAC) 20 MG/ML IV SOLN
INTRAVENOUS | Status: DC | PRN
  Administered 2017-05-02: 60 mg via INTRAVENOUS

## 2017-05-02 MED ORDER — BISACODYL 10 MG RE SUPP: 10.0000 mg | Freq: Every day | RECTAL | Status: DC | PRN

## 2017-05-02 MED ORDER — METOCLOPRAMIDE HCL 5 MG PO TABS: 5.0000 mg | ORAL_TABLET | Freq: Three times a day (TID) | ORAL | Status: DC | PRN

## 2017-05-02 MED ORDER — 0.9 % SODIUM CHLORIDE (POUR BTL) OPTIME
TOPICAL | Status: DC | PRN
  Administered 2017-05-02: 1000 mL

## 2017-05-02 MED ORDER — LACTATED RINGERS IV SOLN: INTRAVENOUS | Status: DC

## 2017-05-02 MED ORDER — PROPOFOL 10 MG/ML IV BOLUS
INTRAVENOUS | Status: AC
  Filled 2017-05-02: qty 20

## 2017-05-02 MED ORDER — ACETAMINOPHEN 325 MG PO TABS: 650.0000 mg | ORAL_TABLET | ORAL | Status: DC | PRN

## 2017-05-02 MED ORDER — EPHEDRINE SULFATE 50 MG/ML IJ SOLN
INTRAMUSCULAR | Status: DC | PRN
  Administered 2017-05-02 (×2): 5 mg via INTRAVENOUS

## 2017-05-02 MED ORDER — PHENYLEPHRINE HCL 10 MG/ML IJ SOLN
INTRAMUSCULAR | Status: DC | PRN
  Administered 2017-05-02: 40 ug via INTRAVENOUS
  Administered 2017-05-02 (×2): 80 ug via INTRAVENOUS
  Administered 2017-05-02: 120 ug via INTRAVENOUS

## 2017-05-02 MED ORDER — MIDAZOLAM HCL 5 MG/5ML IJ SOLN
INTRAMUSCULAR | Status: DC | PRN
  Administered 2017-05-02: 2 mg via INTRAVENOUS

## 2017-05-02 MED ORDER — CHLORHEXIDINE GLUCONATE 4 % EX LIQD: 60.0000 mL | Freq: Once | CUTANEOUS | Status: DC

## 2017-05-02 MED ORDER — ACETAMINOPHEN 650 MG RE SUPP: 650.0000 mg | RECTAL | Status: DC | PRN

## 2017-05-02 MED ORDER — DOCUSATE SODIUM 100 MG PO CAPS: 100.0000 mg | ORAL_CAPSULE | Freq: Two times a day (BID) | ORAL | Status: DC

## 2017-05-02 MED ORDER — POLYETHYLENE GLYCOL 3350 17 G PO PACK: 17.0000 g | PACK | Freq: Every day | ORAL | Status: DC | PRN

## 2017-05-02 MED ORDER — ONDANSETRON HCL 4 MG/2ML IJ SOLN
INTRAMUSCULAR | Status: DC | PRN
  Administered 2017-05-02: 4 mg via INTRAVENOUS

## 2017-05-02 MED ORDER — ROCURONIUM BROMIDE 100 MG/10ML IV SOLN
INTRAVENOUS | Status: DC | PRN
  Administered 2017-05-02: 30 mg via INTRAVENOUS

## 2017-05-02 MED ORDER — FENTANYL CITRATE (PF) 250 MCG/5ML IJ SOLN
INTRAMUSCULAR | Status: AC
  Filled 2017-05-02: qty 5

## 2017-05-02 MED ORDER — ONDANSETRON HCL 4 MG/2ML IJ SOLN: 4.0000 mg | Freq: Four times a day (QID) | INTRAMUSCULAR | Status: DC | PRN

## 2017-05-02 MED ORDER — POVIDONE-IODINE 10 % EX SWAB: 2.0000 "application " | Freq: Once | CUTANEOUS | Status: DC

## 2017-05-02 SURGICAL SUPPLY — 39 items
APL SKNCLS STERI-STRIP NONHPOA (GAUZE/BANDAGES/DRESSINGS) ×2
BENZOIN TINCTURE PRP APPL 2/3 (GAUZE/BANDAGES/DRESSINGS) ×3 IMPLANT
BLADE SAW SGTL HD 18.5X60.5X1. (BLADE) ×4 IMPLANT
BLADE SURG 21 STRL SS (BLADE) ×3 IMPLANT
BNDG COHESIVE 4X5 TAN STRL (GAUZE/BANDAGES/DRESSINGS) IMPLANT
BNDG GAUZE ELAST 4 BULKY (GAUZE/BANDAGES/DRESSINGS) IMPLANT
COVER SURGICAL LIGHT HANDLE (MISCELLANEOUS) ×3 IMPLANT
DRAPE INCISE IOBAN 66X45 STRL (DRAPES) ×4 IMPLANT
DRAPE U-SHAPE 47X51 STRL (DRAPES) ×3 IMPLANT
DRESSING PEEL AND PLC PRVNA 13 (GAUZE/BANDAGES/DRESSINGS) IMPLANT
DRSG ADAPTIC 3X8 NADH LF (GAUZE/BANDAGES/DRESSINGS) IMPLANT
DRSG PAD ABDOMINAL 8X10 ST (GAUZE/BANDAGES/DRESSINGS) IMPLANT
DRSG PEEL AND PLACE PREVENA 13 (GAUZE/BANDAGES/DRESSINGS) ×3
DURAPREP 26ML APPLICATOR (WOUND CARE) ×3 IMPLANT
ELECT REM PT RETURN 9FT ADLT (ELECTROSURGICAL) ×3
ELECTRODE REM PT RTRN 9FT ADLT (ELECTROSURGICAL) ×2 IMPLANT
GAUZE SPONGE 4X4 12PLY STRL (GAUZE/BANDAGES/DRESSINGS) IMPLANT
GLOVE BIO SURGEON STRL SZ 6.5 (GLOVE) ×2 IMPLANT
GLOVE BIOGEL PI IND STRL 6.5 (GLOVE) IMPLANT
GLOVE BIOGEL PI IND STRL 9 (GLOVE) ×2 IMPLANT
GLOVE BIOGEL PI INDICATOR 6.5 (GLOVE) ×1
GLOVE BIOGEL PI INDICATOR 9 (GLOVE) ×1
GLOVE SURG ORTHO 9.0 STRL STRW (GLOVE) ×3 IMPLANT
GOWN STRL REUS W/ TWL XL LVL3 (GOWN DISPOSABLE) ×6 IMPLANT
GOWN STRL REUS W/TWL XL LVL3 (GOWN DISPOSABLE) ×9
KIT BASIN OR (CUSTOM PROCEDURE TRAY) ×3 IMPLANT
KIT DRSG PREVENA PLUS 7DAY 125 (MISCELLANEOUS) ×1 IMPLANT
KIT ROOM TURNOVER OR (KITS) ×3 IMPLANT
NS IRRIG 1000ML POUR BTL (IV SOLUTION) ×3 IMPLANT
PACK ORTHO EXTREMITY (CUSTOM PROCEDURE TRAY) ×3 IMPLANT
PAD ARMBOARD 7.5X6 YLW CONV (MISCELLANEOUS) ×6 IMPLANT
SPONGE LAP 18X18 X RAY DECT (DISPOSABLE) IMPLANT
SUT ETHILON 2 0 PSLX (SUTURE) ×6 IMPLANT
SUT VIC AB 2-0 CTB1 (SUTURE) IMPLANT
TOWEL OR 17X24 6PK STRL BLUE (TOWEL DISPOSABLE) ×3 IMPLANT
TOWEL OR 17X26 10 PK STRL BLUE (TOWEL DISPOSABLE) ×3 IMPLANT
TUBE CONNECTING 12X1/4 (SUCTIONS) ×3 IMPLANT
WATER STERILE IRR 1000ML POUR (IV SOLUTION) ×3 IMPLANT
YANKAUER SUCT BULB TIP NO VENT (SUCTIONS) ×3 IMPLANT

## 2017-05-02 NOTE — Anesthesia Procedure Notes (Signed)
Procedure Name: Intubation Date/Time: 05/02/2017 11:49 AM Performed by: Glynda Jaeger, CRNA Pre-anesthesia Checklist: Patient identified, Patient being monitored, Timeout performed, Emergency Drugs available and Suction available Patient Re-evaluated:Patient Re-evaluated prior to induction Oxygen Delivery Method: Circle System Utilized Preoxygenation: Pre-oxygenation with 100% oxygen Induction Type: IV induction Ventilation: Mask ventilation without difficulty Laryngoscope Size: Mac and 3 Grade View: Grade I Tube type: Oral Tube size: 7.5 mm Number of attempts: 1 Airway Equipment and Method: Stylet Placement Confirmation: ETT inserted through vocal cords under direct vision,  positive ETCO2 and breath sounds checked- equal and bilateral Secured at: 21 cm Tube secured with: Tape Dental Injury: Teeth and Oropharynx as per pre-operative assessment

## 2017-05-02 NOTE — Transfer of Care (Signed)
Immediate Anesthesia Transfer of Care Note  Patient: Keith [name]  Procedure(s) Performed: AMPUTATION TRANSMETARSAL (Right ) TRANSESOPHAGEAL ECHOCARDIOGRAM (TEE) (N/A )  Patient Location: PACU  Anesthesia Type:General  Level of Consciousness: awake, alert , patient cooperative and responds to stimulation  Airway & Oxygen Therapy: Patient Spontanous Breathing and Patient connected to face mask oxygen  Post-op Assessment: Report given to RN, Post -op Vital signs reviewed and stable and Patient moving all extremities X 4  Post vital signs: Reviewed and stable  Last Vitals:  Vitals:   05/02/17 1233 05/02/17 1237  BP:  124/80  Pulse:  82  Resp:  (!) 25  Temp: (!) 36.3 C   SpO2:  100%    Last Pain:  Vitals:   05/02/17 0900  TempSrc:   PainSc: 10-Worst pain ever      Patients Stated Pain Goal: 3 (79/39/03 0092)  Complications: No apparent anesthesia complications

## 2017-05-02 NOTE — Progress Notes (Signed)
    [place] for Infectious Disease    Date of Admission:  04/28/2017     ID: Keith [name] is a 52 y.o. male with HTN, CKD III, DM, and HIV admitted with progressive chronic right first metatarsal osteomyelitis despite outpatient therapy with Augmentin. Now with MRSA bacteremia. Echocardiogram with minimal mitral regurg, no vegetations.   Antibiotics: 1/21 IV Vanc >>  1/21 IV Cefepime >> dc'd 1/22 1/22 IV Zosyn x 1 dose >> dc'd 1/22  Cultures: 1/22 Blood Cx >> MRSA 1/22 Wound Cx >> MRSA 1/23 Blood Cx >> Staph aureus   Subjective: Blood cultures persistently positive. S/p transmetatarsal amputation this AM. TEE completed as well, read pending. Seen post operatively and doing well. Family at bedside. Answered all questions.   Medications:  . abacavir-dolutegravir-lamiVUDine  1 tablet Oral QHS  . dapsone  100 mg Oral Daily  . darunavir-cobicistat  1 tablet Oral Q breakfast  . docusate sodium  100 mg Oral BID  . enoxaparin (LOVENOX) injection  55 mg Subcutaneous Daily  . insulin aspart  0-15 Units Subcutaneous TID WC  . insulin aspart  0-5 Units Subcutaneous QHS  . insulin glargine  20 Units Subcutaneous Q2200  . polyethylene glycol  17 g Oral Daily  . senna  1 tablet Oral BID    Objective: Vital signs in last 24 hours: Temp:  [97.3 F (36.3 C)-99.2 F (37.3 C)] 98.5 F (36.9 C) (01/25 1337) Pulse Rate:  [82-93] 86 (01/25 1337) Resp:  [17-25] 18 (01/25 1337) BP: (124-166)/(80-97) 137/86 (01/25 1337) SpO2:  [97 %-100 %] 97 % (01/25 1337) Weight:  [243 lb 9.7 oz (110.5 kg)] 243 lb 9.7 oz (110.5 kg) (01/25 1113)  Constitutional: NAD, appears comfortable HEENT: Atraumatic, normocephalic. PERRL, anicteric sclera.  Neck: Supple, trachea midline.  Cardiovascular: RRR, no murmurs, rubs, or gallops.  Pulmonary/Chest: CTAB, no wheezes, rales, or rhonchi.  Abdominal: Soft, non tender, non distended. +BS.  Extremities: Warm and well perfused. Right foot s/p transmetatarsal  amputation, wound vac in place Neurological: A&Ox3, CN II - XII grossly intact.  Skin: No rashes or erythema  Psychiatric: Normal mood and affect  Lab Results Recent Labs    04/30/17 0923 05/01/17 0752  WBC 13.9* 9.6  HGB 8.9* 9.2*  HCT 27.3* 28.1*  NA 136 136  K 3.5 3.3*  CL 100* 99*  CO2 22 24  BUN 13 10  CREATININE 1.56* 1.21   Assessment/Plan: Continue IV vancomycin. Appreciate orthopedic consult. Will f/u TEE to determine treatment duration. If no vegetations, will plan for 2 weeks of IV abx from negative culture date. Repeat blood cultures in AM.   [facility] for Infectious Diseases Cell: [phone] Pager: [phone]  05/02/2017, 2:04 PM

## 2017-05-02 NOTE — Progress Notes (Addendum)
Nutrition Consult/Brief Note  RD consulted for nutrition education regarding diabetes.   CBG (last 3)  Recent Labs    05/01/17 2032 05/02/17 0530 05/02/17 1106  GLUCAP 191* 146* 103*   Lab Results  Component Value Date   HGBA1C 10.1 (H) 03/15/2017   Education not appropriate at this time. Pt currently in OR for surgical intervention. RD to revisit pt and provide diet education at later date.  Arthur Holms, RD, LDN Pager #: [phone] After-Hours Pager #: [phone]

## 2017-05-02 NOTE — Anesthesia Preprocedure Evaluation (Addendum)
Anesthesia Evaluation  Patient identified by MRN, date of birth, ID band Patient awake    Reviewed: Allergy & Precautions, H&P , NPO status , Patient's Chart, lab work & pertinent test results  Airway Mallampati: III  TM Distance: >3 FB Neck ROM: Full    Dental no notable dental hx. (+) Edentulous Upper, Edentulous Lower, Dental Advisory Given   Pulmonary neg pulmonary ROS,    Pulmonary exam normal breath sounds clear to auscultation       Cardiovascular hypertension, Pt. on medications  Rhythm:Regular Rate:Normal     Neuro/Psych negative neurological ROS  negative psych ROS   GI/Hepatic negative GI ROS, Neg liver ROS,   Endo/Other  diabetes, Poorly Controlled, Insulin DependentMorbid obesity  Renal/GU negative Renal ROS  negative genitourinary   Musculoskeletal  (+) Arthritis , Osteoarthritis,    Abdominal   Peds  Hematology  (+) HIV,   Anesthesia Other Findings   Reproductive/Obstetrics negative OB ROS                            Anesthesia Physical Anesthesia Plan  ASA: III  Anesthesia Plan: General   Post-op Pain Management:    Induction: Intravenous  PONV Risk Score and Plan: 3 and Ondansetron and Midazolam  Airway Management Planned: LMA  Additional Equipment:   Intra-op Plan:   Post-operative Plan: Extubation in OR  Informed Consent: I have reviewed the patients History and Physical, chart, labs and discussed the procedure including the risks, benefits and alternatives for the proposed anesthesia with the patient or authorized representative who has indicated his/her understanding and acceptance.   Dental advisory given  Plan Discussed with: CRNA  Anesthesia Plan Comments:         Anesthesia Quick Evaluation

## 2017-05-02 NOTE — Op Note (Signed)
04/28/2017 - 05/02/2017  12:32 PM  PATIENT:  Keith [name]    PRE-OPERATIVE DIAGNOSIS:  RIGHT FOOT  POST-OPERATIVE DIAGNOSIS:  Same  PROCEDURE:  AMPUTATION TRANSMETARSAL  SURGEON:  [name], MD  PHYSICIAN ASSISTANT:None ANESTHESIA:   General  PREOPERATIVE INDICATIONS:  Keith [name] is a  52 y.o. male with a diagnosis of RIGHT FOOT who failed conservative measures and elected for surgical management.    The risks benefits and alternatives were discussed with the patient preoperatively including but not limited to the risks of infection, bleeding, nerve injury, cardiopulmonary complications, the need for revision surgery, among others, and the patient was willing to proceed.  OPERATIVE IMPLANTS: None.  OPERATIVE FINDINGS: No abscess at the level of amputation.  Muscle healthy and viable with calcified vessels.  OPERATIVE PROCEDURE: Patient was brought the operating room and underwent a general anesthetic.  After adequate levels of anesthesia were obtained patient's right lower extremity was prepped using DuraPrep draped into a sterile field a timeout was called.  A fishmouth incision was made just proximal to the ulcer on the plantar aspect of the right foot.  Patient underwent a transmetatarsal amputation.  Electrocautery was used for hemostasis there is no evidence of infection at the level of amputation.  The muscle was healthy viable and the vessels were calcified.  After hemostasis the wound was irrigated with normal saline.  2-0 nylon was used to close the incision.  A Praveena incisional wound VAC was applied this had a good suction fit patient underwent separate procedure with cardiology for transesophageal echocardiogram.  Patient after completion of both procedures was extubated taken the PACU in stable condition.   DISCHARGE PLANNING:  Antibiotic duration: 48 hours postoperatively.    Weightbearing: Strict nonweightbearing right foot.  Pain medication: As  needed.  Dressing care/ Wound VAC: Continue wound VAC for 1 week and then start dry dressing changes.  Ambulatory devices: Patient will need walker wheelchair and kneeling scooter.  Discharge to: Home stable condition.  Follow-up: In the office 1 week post operative.

## 2017-05-02 NOTE — Interval H&P Note (Signed)
History and Physical Interval Note:  05/02/2017 10:23 AM  Keith [name]  has presented today for surgery, with the diagnosis of RIGHT FOOT  The various methods of treatment have been discussed with the patient and family. After consideration of risks, benefits and other options for treatment, the patient has consented to  Procedure(s): AMPUTATION TRANSMETARSAL (Right) as a surgical intervention .  The patient's history has been reviewed, patient examined, no change in status, stable for surgery.  I have reviewed the patient's chart and labs.  Questions were answered to the patient's satisfaction.     [name]

## 2017-05-02 NOTE — Anesthesia Postprocedure Evaluation (Signed)
Anesthesia Post Note  Patient: Keith [name]  Procedure(s) Performed: AMPUTATION TRANSMETARSAL (Right Foot) TRANSESOPHAGEAL ECHOCARDIOGRAM (TEE) (N/A )     Patient location during evaluation: PACU Anesthesia Type: General Level of consciousness: awake and alert Pain management: pain level controlled Vital Signs Assessment: post-procedure vital signs reviewed and stable Respiratory status: spontaneous breathing, nonlabored ventilation and respiratory function stable Cardiovascular status: blood pressure returned to baseline and stable Postop Assessment: no apparent nausea or vomiting Anesthetic complications: no    Last Vitals:  Vitals:   05/02/17 1320 05/02/17 1337  BP: 133/81 137/86  Pulse: 84 86  Resp: 20 18  Temp: 36.8 C 36.9 C  SpO2: 98% 97%    Last Pain:  Vitals:   05/02/17 0900  TempSrc:   PainSc: 10-Worst pain ever                 Sya Nestler,W. EDMOND

## 2017-05-02 NOTE — Progress Notes (Signed)
  Echocardiogram Echocardiogram Transesophageal has been performed.  Keith [name] 05/02/2017, 1:18 PM

## 2017-05-02 NOTE — Progress Notes (Addendum)
PROGRESS NOTE  Keith [name]  OXB:[id number] DOB: 1965-09-10 DOA: 04/28/2017 PCP: Scot Jun, FNP   Brief Narrative: Keith [name] is a 52 y.o. male with a history of HIV on ART (last CD4 210, undetectable VL), T2DM, HTN, stage III CKD, and chronic right foot osteomyelitis who presented to the ED 1/21 with worsening distal plantar pain and fever/chills concerning for worsening wound infection/osteomyelitis despite outpatient augmentin. He was tachycardic, afebrile with leukocytosis  (WBC 13.4) and XR demonstrating progressive demineralization in the first metatarsal head suggesting osteomyelitis. CRP found to be 14.9, ESR 140, lactic acid reassuringly normal. Broad IV abx started and [name] contacted for orthopedic consultation.   Assessment & Plan: Active Problems:   Osteomyelitis of metatarsal (HCC)   Wound infection  Sepsis due MRSA bacteremia and acute on chronic osteomyelitis of right first metatarsal with overlying cellulitis of right foot: MRI with significant worsening of osteo, myositis, and abscess. ESR 140, CRP 14.9. Has been followed closely by [name]. - Continue vancomycin.  - Repeat blood cultures from 1/23 growing GPC in 1 of 2 draws. Appreciate ID involvement. ?Repeat Cx's today vs. wait til AM. Would suspect abbreviated course would allow for midline PIV for abx (hope to avoid PICC w/CKD). Would insert this once blood cultures negative x at least 48 hours. - Discussed with cardiology, TEE showed no vegetations (done while in OR).  - TMA 1/25 per [name] - Pain control  HIV on ART:  - CD4 count down to 80, ID started dapsone, VL undetectable. - Continue Triumeq, Prezcobix  IDT2DM:  - Continue lantus 20u qHS, SSI qAC/HS. CBGs well controlled. - Pt has very little insight into effects of diet. Consulted RD and diabetes coordinator  Stage III CKD: Cr appears stable to baseline.  - Monitor BMP, UOP - Renally dose medications, caution with  vancomycin  Hematuria: Uncertain etiology. Hgb near baseline.  - Defer further evaluation to outpatient setting.   Protein gap: Protein gap of 5.4 present is likely secondary to HIV infection.  - Can consider further workup for paraproteinemia in the outpatient setting.  DVT prophylaxis: Lovenox Code Status: Full Family Communication: Mother, brother at bedside Disposition Plan: Uncertain  Consultants:   Orthopedic surgery, [name]  ID  Cardiology  Procedures:   Echo 1/23 Study Conclusions - Left ventricle: The cavity size was normal. Wall thickness was   normal. Systolic function was normal. The estimated ejection   fraction was in the range of 55% to 60%. Wall motion was normal;   there were no regional wall motion abnormalities. Doppler   parameters are consistent with abnormal left ventricular   relaxation (grade 1 diastolic dysfunction). - Mitral valve: There was trivial regurgitation.  Impressions: - There was no evidence of a vegetation.  Transesophageal echocardiogram 05/02/2017: The case was done in the OR just following the partial foot amputation by [name]  Left Ventrical:  Normal LV function  Mitral Valve: normal,  No vegetation   Aortic Valve: normal, no vegetation   Tricuspid Valve: normal , no vegetation  Pulmonic Valve: normal   Left Atrium/ Left atrial appendage: normal , no thrombi   Atrial septum:  No ASD or PFO by color flow   Aorta:  Normal   Complications: No apparent complications Patient did tolerate procedure well.  Antimicrobials:  Vancomycin 1/21 >>   Cefepime 1/21 - 1/22  Zosyn 1/22 >>    Subjective: Seen before surgery, no complaints.  Objective: Vitals:   05/02/17 1300  05/02/17 1320 05/02/17 1337 05/02/17 1400  BP:  133/81 137/86 (!) 145/84  Pulse: 86 84 86 82  Resp: (!) _0 Temp:  98.2 F (36.8 C) 98.5 F (36.9 C) 98.4 F (36.9 C)  TempSrc:    Oral  SpO2: 99% 98% 97% 98%  Weight:       Height:        Intake/Output Summary (Last 24 hours) at 05/02/2017 1449 Last data filed at 05/02/2017 1239 Gross per 24 hour  Intake 690 ml  Output 655 ml  Net 35 ml   Filed Weights   04/30/17 0539 05/01/17 0532 05/02/17 1113  Weight: 110.3 kg (243 lb 2.7 oz) 110.5 kg (243 lb 9.7 oz) 110.5 kg (243 lb 9.7 oz)    Gen: 52 y.o. male in no distress Pulm: Non-labored breathing room air. Clear to auscultation bilaterally. CV: Regular rate and rhythm. No murmur, rub, or gallop. No JVD, no significant pedal edema. GI: Abdomen soft, non-tender, non-distended, with normoactive bowel sounds. No organomegaly or masses felt. Skin: Right foot with open ulceration along plantar surface with tunneling and lateral wound. Not healing.  Neuro: Alert and oriented. Decreased sensation bilateral distal LE's.  Psych: Judgement and insight appear intact.   Data Reviewed: I have personally reviewed following labs and imaging studies  CBC: Recent Labs  Lab 04/28/17 1444 04/29/17 0644 04/30/17 0923 05/01/17 0752  WBC 13.4* 11.9* 13.9* 9.6  NEUTROABS 10.3* 9.1*  --   --   HGB 9.3* 8.9* 8.9* 9.2*  HCT 28.1* 28.0* 27.3* 28.1*  MCV 85.7 85.6 84.5 83.6  PLT 363 342 389 458   Basic Metabolic Panel: Recent Labs  Lab 04/28/17 1444 04/28/17 2347 04/29/17 0640 04/30/17 0923 05/01/17 0752  NA 135  --  134* 136 136  K 3.8  --  3.6 3.5 3.3*  CL 101  --  100* 100* 99*  CO2 23  --  21* 22 24  GLUCOSE 173*  --  120* 95 99  BUN 14  --  _1 CREATININE 1.56*  --  1.47* 1.56* 1.21  CALCIUM 9.2  --  8.9 9.1 9.0  MG  --  1.7  --   --   --   PHOS  --  3.1  --   --   --    GFR: Estimated Creatinine Clearance: 87.5 mL/min (by C-G formula based on SCr of 1.21 mg/dL). Liver Function Tests: Recent Labs  Lab 04/28/17 1444  AST 20  ALT 18  ALKPHOS 121  BILITOT 0.8  PROT 8.4*  ALBUMIN 3.0*   No results for input(s): LIPASE, AMYLASE in the last 168 hours. No results for input(s): AMMONIA in the  last 168 hours. Coagulation Profile: Recent Labs  Lab 04/28/17 2347  INR 1.16   Cardiac Enzymes: No results for input(s): CKTOTAL, CKMB, CKMBINDEX, TROPONINI in the last 168 hours. BNP (last 3 results) No results for input(s): PROBNP in the last 8760 hours. HbA1C: No results for input(s): HGBA1C in the last 72 hours. CBG: Recent Labs  Lab 05/01/17 0630 05/01/17 2032 05/02/17 0530 05/02/17 1106 05/02/17 1237  GLUCAP 117* 191* 146* 103* 108*   Lipid Profile: No results for input(s): CHOL, HDL, LDLCALC, TRIG, CHOLHDL, LDLDIRECT in the last 72 hours. Thyroid Function Tests: No results for input(s): TSH, T4TOTAL, FREET4, T3FREE, THYROIDAB in the last 72 hours. Anemia Panel: No results for input(s): VITAMINB12, FOLATE, FERRITIN, TIBC, IRON, RETICCTPCT in the last 72 hours. Urine analysis:  Component Value Date/Time   COLORURINE YELLOW 04/29/2017 0438   APPEARANCEUR HAZY (A) 04/29/2017 0438   LABSPEC 1.018 04/29/2017 0438   PHURINE 5.0 04/29/2017 0438   GLUCOSEU NEGATIVE 04/29/2017 0438   HGBUR SMALL (A) 04/29/2017 0438   BILIRUBINUR NEGATIVE 04/29/2017 0438   KETONESUR NEGATIVE 04/29/2017 0438   PROTEINUR 100 (A) 04/29/2017 0438   UROBILINOGEN 1.0 01/01/2017 1032   NITRITE NEGATIVE 04/29/2017 0438   LEUKOCYTESUR NEGATIVE 04/29/2017 0438   Recent Results (from the past 240 hour(s))  Culture, blood (routine x 2)     Status: Abnormal   Collection Time: 04/29/17 12:00 AM  Result Value Ref Range Status   Specimen Description BLOOD RIGHT ANTECUBITAL  Final   Special Requests IN PEDIATRIC BOTTLE Blood Culture adequate volume  Final   Culture  Setup Time   Final    GRAM POSITIVE COCCI IN CLUSTERS IN PEDIATRIC BOTTLE CRITICAL RESULT CALLED TO, READ BACK BY AND VERIFIED WITH: J. CARNEY, RPHARMD AT 1810 ON 04/29/17 BY C. JESSUP, MLT.    Culture (A)  Final    STAPHYLOCOCCUS AUREUS SUSCEPTIBILITIES PERFORMED ON PREVIOUS CULTURE WITHIN THE LAST 5 DAYS.    Report Status  05/01/2017 FINAL  Final  Culture, blood (routine x 2)     Status: Abnormal   Collection Time: 04/29/17 12:08 AM  Result Value Ref Range Status   Specimen Description BLOOD RIGHT ANTECUBITAL  Final   Special Requests IN PEDIATRIC BOTTLE Blood Culture adequate volume  Final   Culture  Setup Time   Final    GRAM POSITIVE COCCI IN PEDIATRIC BOTTLE CRITICAL RESULT CALLED TO, READ BACK BY AND VERIFIED WITH: J. CARNEY, RPHARMD AT 1810 ON 04/29/17 BY C. JESSUP, MLT.    Culture METHICILLIN RESISTANT STAPHYLOCOCCUS AUREUS (A)  Final   Report Status 05/01/2017 FINAL  Final   Organism ID, Bacteria METHICILLIN RESISTANT STAPHYLOCOCCUS AUREUS  Final      Susceptibility   Methicillin resistant staphylococcus aureus - MIC*    CIPROFLOXACIN >=8 RESISTANT Resistant     ERYTHROMYCIN >=8 RESISTANT Resistant     GENTAMICIN <=0.5 SENSITIVE Sensitive     OXACILLIN >=4 RESISTANT Resistant     TETRACYCLINE <=1 SENSITIVE Sensitive     VANCOMYCIN 1 SENSITIVE Sensitive     TRIMETH/SULFA <=10 SENSITIVE Sensitive     CLINDAMYCIN <=0.25 SENSITIVE Sensitive     RIFAMPIN <=0.5 SENSITIVE Sensitive     Inducible Clindamycin NEGATIVE Sensitive     * METHICILLIN RESISTANT STAPHYLOCOCCUS AUREUS  Blood Culture ID Panel (Reflexed)     Status: Abnormal   Collection Time: 04/29/17 12:08 AM  Result Value Ref Range Status   Enterococcus species NOT DETECTED NOT DETECTED Final   Listeria monocytogenes NOT DETECTED NOT DETECTED Final   Staphylococcus species DETECTED (A) NOT DETECTED Final    Comment: CRITICAL RESULT CALLED TO, READ BACK BY AND VERIFIED WITH: J. CARNEY, RPHARMD AT 1810 ON 04/29/17 BY C. JESSUP, MLT.    Staphylococcus aureus DETECTED (A) NOT DETECTED Final    Comment: Methicillin (oxacillin)-resistant Staphylococcus aureus (MRSA). MRSA is predictably resistant to beta-lactam antibiotics (except ceftaroline). Preferred therapy is vancomycin unless clinically contraindicated. Patient requires contact precautions  if  hospitalized. CRITICAL RESULT CALLED TO, READ BACK BY AND VERIFIED WITH: J. CARNEY, RPHARMD AT 1810 ON 04/29/17 BY C. JESSUP, MLT.    Methicillin resistance DETECTED (A) NOT DETECTED Final    Comment: CRITICAL RESULT CALLED TO, READ BACK BY AND VERIFIED WITH: J. CARNEY, RPHARMD AT 6295 ON 04/29/17 BY C. JESSUP,  MLT.    Streptococcus species NOT DETECTED NOT DETECTED Final   Streptococcus agalactiae NOT DETECTED NOT DETECTED Final   Streptococcus pneumoniae NOT DETECTED NOT DETECTED Final   Streptococcus pyogenes NOT DETECTED NOT DETECTED Final   Acinetobacter baumannii NOT DETECTED NOT DETECTED Final   Enterobacter cloacae complex NOT DETECTED NOT DETECTED Final   Escherichia coli NOT DETECTED NOT DETECTED Final   Klebsiella oxytoca NOT DETECTED NOT DETECTED Final   Klebsiella pneumoniae NOT DETECTED NOT DETECTED Final   Serratia marcescens NOT DETECTED NOT DETECTED Final   Carbapenem resistance NOT DETECTED NOT DETECTED Final   Haemophilus influenzae NOT DETECTED NOT DETECTED Final   Neisseria meningitidis NOT DETECTED NOT DETECTED Final   Pseudomonas aeruginosa NOT DETECTED NOT DETECTED Final   Candida albicans NOT DETECTED NOT DETECTED Final   Candida glabrata NOT DETECTED NOT DETECTED Final   Candida krusei NOT DETECTED NOT DETECTED Final   Candida parapsilosis NOT DETECTED NOT DETECTED Final   Candida tropicalis NOT DETECTED NOT DETECTED Final  MRSA PCR Screening     Status: Abnormal   Collection Time: 04/29/17  4:38 AM  Result Value Ref Range Status   MRSA by PCR POSITIVE (A) NEGATIVE Final    Comment:        The GeneXpert MRSA Assay (FDA approved for NASAL specimens only), is one component of a comprehensive MRSA colonization surveillance program. It is not intended to diagnose MRSA infection nor to guide or monitor treatment for MRSA infections. RESULT CALLED TO, READ BACK BY AND VERIFIED WITH: RN R Arlester Marker [id number] MLM   Wound or Superficial Culture      Status: None   Collection Time: 04/29/17 12:10 PM  Result Value Ref Range Status   Specimen Description WOUND FOOT  Final   Special Requests NONE  Final   Gram Stain   Final    NO WBC SEEN RARE GRAM POSITIVE COCCI IN PAIRS IN SINGLES    Culture FEW METHICILLIN RESISTANT STAPHYLOCOCCUS AUREUS  Final   Report Status 05/01/2017 FINAL  Final   Organism ID, Bacteria METHICILLIN RESISTANT STAPHYLOCOCCUS AUREUS  Final      Susceptibility   Methicillin resistant staphylococcus aureus - MIC*    CIPROFLOXACIN >=8 RESISTANT Resistant     ERYTHROMYCIN >=8 RESISTANT Resistant     GENTAMICIN <=0.5 SENSITIVE Sensitive     OXACILLIN >=4 RESISTANT Resistant     TETRACYCLINE <=1 SENSITIVE Sensitive     VANCOMYCIN 1 SENSITIVE Sensitive     TRIMETH/SULFA <=10 SENSITIVE Sensitive     CLINDAMYCIN <=0.25 SENSITIVE Sensitive     RIFAMPIN <=0.5 SENSITIVE Sensitive     Inducible Clindamycin NEGATIVE Sensitive     * FEW METHICILLIN RESISTANT STAPHYLOCOCCUS AUREUS  Culture, blood (routine x 2)     Status: Abnormal   Collection Time: 04/30/17  9:20 AM  Result Value Ref Range Status   Specimen Description BLOOD RIGHT HAND  Final   Special Requests IN PEDIATRIC BOTTLE Blood Culture adequate volume  Final   Culture  Setup Time   Final    GRAM POSITIVE COCCI IN PEDIATRIC BOTTLE CRITICAL RESULT CALLED TO, READ BACK BY AND VERIFIED WITH: PARKEY RN AT 1141 ON [id number] BY SJW    Culture (A)  Final    STAPHYLOCOCCUS AUREUS SUSCEPTIBILITIES PERFORMED ON PREVIOUS CULTURE WITHIN THE LAST 5 DAYS.    Report Status 05/02/2017 FINAL  Final  Culture, blood (routine x 2)     Status: None (Preliminary result)   Collection Time: 04/30/17  9:27 AM  Result Value Ref Range Status   Specimen Description BLOOD RIGHT HAND  Final   Special Requests IN PEDIATRIC BOTTLE Blood Culture adequate volume  Final   Culture NO GROWTH 2 DAYS  Final   Report Status PENDING  Incomplete      Radiology Studies: No results  found.  Scheduled Meds: . abacavir-dolutegravir-lamiVUDine  1 tablet Oral QHS  . dapsone  100 mg Oral Daily  . darunavir-cobicistat  1 tablet Oral Q breakfast  . docusate sodium  100 mg Oral BID  . enoxaparin (LOVENOX) injection  55 mg Subcutaneous Daily  . insulin aspart  0-15 Units Subcutaneous TID WC  . insulin aspart  0-5 Units Subcutaneous QHS  . insulin glargine  20 Units Subcutaneous Q2200  . polyethylene glycol  17 g Oral Daily  . senna  1 tablet Oral BID   Continuous Infusions: . sodium chloride    . lactated ringers    . methocarbamol (ROBAXIN)  IV    . vancomycin Stopped (05/02/17 0046)     LOS: 4 days   Time spent: 25 minutes.  Vance Gather, MD [facility] Pager [phone]  If 7PM-7AM, please contact night-coverage [email or web address] Password [facility] 05/02/2017, 2:49 PM

## 2017-05-02 NOTE — Progress Notes (Signed)
Orthopedic Tech Progress Note Patient Details:  Keith [name] 1965-11-21 [id number]  Ortho Devices Type of Ortho Device: Postop shoe/boot Ortho Device/Splint Location: rle Ortho Device/Splint Interventions: Application   Post Interventions Patient Tolerated: Well Instructions Provided: Care of device   [name] 05/02/2017, 2:57 PM

## 2017-05-02 NOTE — CV Procedure (Signed)
    Transesophageal Echocardiogram Note  Keith [name] [id number] [birth year]  Procedure: Transesophageal Echocardiogram Indications: bacteremia   Procedure Details Consent: Obtained Time Out: Verified patient identification, verified procedure, site/side was marked, verified correct patient position, special equipment/implants available, Radiology Safety Procedures followed,  medications/allergies/relevent history reviewed, required imaging and test results available.  Performed  Medications:  The case was done in the OR just following the partial foot amputation by [name]  Left Ventrical:  Normal LV function  Mitral Valve: normal,  No vegetation   Aortic Valve: normal, no vegetation   Tricuspid Valve: normal , no vegetation   Pulmonic Valve: normal   Left Atrium/ Left atrial appendage: normal , no thrombi   Atrial septum:  No ASD or PFO by color flow   Aorta:  Normal    Complications: No apparent complications Patient did tolerate procedure well.   Thayer Headings, [name] Bonito., MD, [facility] 05/02/2017, 3:49 PM

## 2017-05-03 ENCOUNTER — Encounter (HOSPITAL_COMMUNITY): Payer: Self-pay | Admitting: Orthopedic Surgery

## 2017-05-03 LAB — GLUCOSE, CAPILLARY
Glucose-Capillary: 120 mg/dL — ABNORMAL HIGH (ref 65–99)
Glucose-Capillary: 138 mg/dL — ABNORMAL HIGH (ref 65–99)
Glucose-Capillary: 129 mg/dL — ABNORMAL HIGH (ref 65–99)
Glucose-Capillary: 117 mg/dL — ABNORMAL HIGH (ref 65–99)

## 2017-05-03 LAB — BASIC METABOLIC PANEL
Anion gap: 11 (ref 5–15)
BUN: 7 mg/dL (ref 6–20)
CO2: 25 mmol/L (ref 22–32)
Calcium: 8.7 mg/dL — ABNORMAL LOW (ref 8.9–10.3)
Chloride: 97 mmol/L — ABNORMAL LOW (ref 101–111)
Creatinine, Ser: 1.36 mg/dL — ABNORMAL HIGH (ref 0.61–1.24)
GFR calc Af Amer: >60 mL/min (ref >60)
GFR calc non Af Amer: 58 mL/min — ABNORMAL LOW (ref >60)
Glucose, Bld: 125 mg/dL — ABNORMAL HIGH (ref 65–99)
Potassium: 3.9 mmol/L (ref 3.5–5.1)
Sodium: 133 mmol/L — ABNORMAL LOW (ref 135–145)

## 2017-05-03 LAB — CBC
HCT: 26.7 % — ABNORMAL LOW (ref 39.0–52.0)
Hemoglobin: 8.4 g/dL — ABNORMAL LOW (ref 13.0–17.0)
MCH: 27 pg (ref 26.0–34.0)
MCHC: 31.5 g/dL (ref 30.0–36.0)
MCV: 85.9 fL (ref 78.0–100.0)
Platelets: 383 10*3/uL (ref 150–400)
RBC: 3.11 MIL/uL — ABNORMAL LOW (ref 4.22–5.81)
RDW: 14.2 % (ref 11.5–15.5)
WBC: 10.3 10*3/uL (ref 4.0–10.5)

## 2017-05-03 MED ORDER — OXYCODONE HCL 5 MG PO TABS
15.0000 mg | ORAL_TABLET | ORAL | Status: DC | PRN
  Administered 2017-05-03 (×2): 15 mg via ORAL
  Administered 2017-05-04 – 2017-05-05 (×3): 20 mg via ORAL
  Filled 2017-05-03: qty 3
  Filled 2017-05-03 (×3): qty 4
  Filled 2017-05-03: qty 3

## 2017-05-03 NOTE — Progress Notes (Signed)
PROGRESS NOTE  Keith [name]  LNL:[id number] DOB: October 23, 1965 DOA: 04/28/2017 PCP: Scot Jun, FNP   Brief Narrative: Keith [name] is a 52 y.o. male with a history of HIV on ART (last CD4 210, undetectable VL), T2DM, HTN, stage III CKD, and chronic right foot osteomyelitis who presented to the ED 1/21 with worsening distal plantar pain and fever/chills concerning for worsening wound infection/osteomyelitis despite outpatient augmentin. He was tachycardic, afebrile with leukocytosis  (WBC 13.4) and XR demonstrating progressive demineralization in the first metatarsal head suggesting osteomyelitis. CRP found to be 14.9, ESR 140, lactic acid reassuringly normal. Broad IV abx started and [name] contacted for orthopedic consultation, who performed right TMA 05/02/2017. Blood cultures grew MRSA, ID consulted, TTE and TEE without signs of vegetation. Repeat blood cultures prior to the surgery were positive, so these are repeated 1/26.   Assessment & Plan: Active Problems:   Osteomyelitis of metatarsal (HCC)   Wound infection   Bacteremia  Sepsis due MRSA bacteremia and acute on chronic osteomyelitis of right first metatarsal with overlying cellulitis of right foot: MRI with significant worsening of osteo, myositis, and abscess. ESR 140, CRP 14.9. Has been followed closely by [name]. TEE negative. - Continue vancomycin.  - Persistent bacteremia prior to surgery, will repeat Cx's 1/26. If negative x48 hrs will start midline IV and continue IV abx for 2 weeks per ID recommendations.   - TMA 1/25 per [name] - Pain control: Increase oxycodone dose. No sedation noted, will continue bowel regimen as well.  HIV on ART:  - CD4 count down to 80, ID started dapsone, VL undetectable. - Continue Triumeq, Prezcobix  IDT2DM:  - Continue lantus 20u qHS, SSI qAC/HS. CBGs well controlled. - Pt has very little insight into effects of diet. Consulted RD and diabetes coordinator  Stage III CKD: Cr  appears stable to baseline.  - Monitor BMP, UOP - Renally dose medications, caution with vancomycin  Hematuria: Uncertain etiology. Hgb near baseline.  - Defer further evaluation to outpatient setting.   Protein gap: Protein gap of 5.4 present is likely secondary to HIV infection.  - Can consider further workup for paraproteinemia in the outpatient setting.  DVT prophylaxis: Lovenox Code Status: Full Family Communication: None at bedside Disposition Plan: Uncertain  Consultants:   Orthopedic surgery, [name]  ID  Cardiology  Procedures:   Echo 1/23 Study Conclusions - Left ventricle: The cavity size was normal. Wall thickness was   normal. Systolic function was normal. The estimated ejection   fraction was in the range of 55% to 60%. Wall motion was normal;   there were no regional wall motion abnormalities. Doppler   parameters are consistent with abnormal left ventricular   relaxation (grade 1 diastolic dysfunction). - Mitral valve: There was trivial regurgitation.  Impressions: - There was no evidence of a vegetation.  Transesophageal echocardiogram 05/02/2017: The case was done in the OR just following the partial foot amputation by [name]  Left Ventrical:  Normal LV function  Mitral Valve: normal,  No vegetation   Aortic Valve: normal, no vegetation   Tricuspid Valve: normal , no vegetation  Pulmonic Valve: normal   Left Atrium/ Left atrial appendage: normal , no thrombi   Atrial septum:  No ASD or PFO by color flow   Aorta:  Normal   Complications: No apparent complications Patient did tolerate procedure well.  Antimicrobials:  Vancomycin 1/21 >>   Cefepime 1/21 - 1/22  Zosyn 1/22 >>  Subjective: Pain in surgical site is severe, not improved enough with oxycodone.  Objective: Vitals:   05/02/17 1400 05/02/17 1930 05/03/17 0635 05/03/17 1427  BP: (!) 145/84 (!) 152/89 121/70 130/77  Pulse: 82 93 85 87  Resp: '20 18 16 18    '$ Temp: 98.4 F (36.9 C) 97.9 F (36.6 C) 99.1 F (37.3 C) 98.6 F (37 C)  TempSrc: Oral Oral Oral Oral  SpO2: 98% 98% 98% 99%  Weight:      Height:        Intake/Output Summary (Last 24 hours) at 05/03/2017 1649 Last data filed at 05/03/2017 1500 Gross per 24 hour  Intake 1272.33 ml  Output 1050 ml  Net 222.33 ml   Filed Weights   04/30/17 0539 05/01/17 0532 05/02/17 1113  Weight: 110.3 kg (243 lb 2.7 oz) 110.5 kg (243 lb 9.7 oz) 110.5 kg (243 lb 9.7 oz)    Gen: 52 y.o. male in no distress Pulm: Non-labored breathing room air. Clear to auscultation bilaterally. CV: Regular rate and rhythm. No murmur, rub, or gallop. No JVD, no significant pedal edema. GI: Abdomen soft, non-tender, non-distended, with normoactive bowel sounds. No organomegaly or masses felt. Skin: Right foot TMA site with vac in place. +pulse, sensation fair.  Neuro: Alert and oriented. Decreased sensation bilateral distal LE's.  Psych: Judgement and insight appear intact.   Data Reviewed: I have personally reviewed following labs and imaging studies  CBC: Recent Labs  Lab 04/28/17 1444 04/29/17 0644 04/30/17 0923 05/01/17 0752 05/03/17 0545  WBC 13.4* 11.9* 13.9* 9.6 10.3  NEUTROABS 10.3* 9.1*  --   --   --   HGB 9.3* 8.9* 8.9* 9.2* 8.4*  HCT 28.1* 28.0* 27.3* 28.1* 26.7*  MCV 85.7 85.6 84.5 83.6 85.9  PLT 363 342 389 400 841   Basic Metabolic Panel: Recent Labs  Lab 04/28/17 1444 04/28/17 2347 04/29/17 0640 04/30/17 0923 05/01/17 0752 05/03/17 0545  NA 135  --  134* 136 136 133*  K 3.8  --  3.6 3.5 3.3* 3.9  CL 101  --  100* 100* 99* 97*  CO2 23  --  21* '22 24 25  '$ GLUCOSE 173*  --  120* 95 99 125*  BUN 14  --  '13 13 10 7  '$ CREATININE 1.56*  --  1.47* 1.56* 1.21 1.36*  CALCIUM 9.2  --  8.9 9.1 9.0 8.7*  MG  --  1.7  --   --   --   --   PHOS  --  3.1  --   --   --   --    GFR: Estimated Creatinine Clearance: 77.8 mL/min (A) (by C-G formula based on SCr of 1.36 mg/dL (H)). Liver  Function Tests: Recent Labs  Lab 04/28/17 1444  AST 20  ALT 18  ALKPHOS 121  BILITOT 0.8  PROT 8.4*  ALBUMIN 3.0*   No results for input(s): LIPASE, AMYLASE in the last 168 hours. No results for input(s): AMMONIA in the last 168 hours. Coagulation Profile: Recent Labs  Lab 04/28/17 2347  INR 1.16   Cardiac Enzymes: No results for input(s): CKTOTAL, CKMB, CKMBINDEX, TROPONINI in the last 168 hours. BNP (last 3 results) No results for input(s): PROBNP in the last 8760 hours. HbA1C: No results for input(s): HGBA1C in the last 72 hours. CBG: Recent Labs  Lab 05/02/17 1237 05/02/17 1603 05/02/17 2120 05/03/17 0638 05/03/17 1128  GLUCAP 108* 118* 163* 120* 138*   Lipid Profile: No results for input(s): CHOL,  HDL, LDLCALC, TRIG, CHOLHDL, LDLDIRECT in the last 72 hours. Thyroid Function Tests: No results for input(s): TSH, T4TOTAL, FREET4, T3FREE, THYROIDAB in the last 72 hours. Anemia Panel: No results for input(s): VITAMINB12, FOLATE, FERRITIN, TIBC, IRON, RETICCTPCT in the last 72 hours. Urine analysis:    Component Value Date/Time   COLORURINE YELLOW 04/29/2017 0438   APPEARANCEUR HAZY (A) 04/29/2017 0438   LABSPEC 1.018 04/29/2017 0438   PHURINE 5.0 04/29/2017 0438   GLUCOSEU NEGATIVE 04/29/2017 0438   HGBUR SMALL (A) 04/29/2017 0438   BILIRUBINUR NEGATIVE 04/29/2017 0438   KETONESUR NEGATIVE 04/29/2017 0438   PROTEINUR 100 (A) 04/29/2017 0438   UROBILINOGEN 1.0 01/01/2017 1032   NITRITE NEGATIVE 04/29/2017 0438   LEUKOCYTESUR NEGATIVE 04/29/2017 0438   Recent Results (from the past 240 hour(s))  Culture, blood (routine x 2)     Status: Abnormal   Collection Time: 04/29/17 12:00 AM  Result Value Ref Range Status   Specimen Description BLOOD RIGHT ANTECUBITAL  Final   Special Requests IN PEDIATRIC BOTTLE Blood Culture adequate volume  Final   Culture  Setup Time   Final    GRAM POSITIVE COCCI IN CLUSTERS IN PEDIATRIC BOTTLE CRITICAL RESULT CALLED TO, READ  BACK BY AND VERIFIED WITH: J. CARNEY, RPHARMD AT 1810 ON 04/29/17 BY C. JESSUP, MLT.    Culture (A)  Final    STAPHYLOCOCCUS AUREUS SUSCEPTIBILITIES PERFORMED ON PREVIOUS CULTURE WITHIN THE LAST 5 DAYS.    Report Status 05/01/2017 FINAL  Final  Culture, blood (routine x 2)     Status: Abnormal   Collection Time: 04/29/17 12:08 AM  Result Value Ref Range Status   Specimen Description BLOOD RIGHT ANTECUBITAL  Final   Special Requests IN PEDIATRIC BOTTLE Blood Culture adequate volume  Final   Culture  Setup Time   Final    GRAM POSITIVE COCCI IN PEDIATRIC BOTTLE CRITICAL RESULT CALLED TO, READ BACK BY AND VERIFIED WITH: J. CARNEY, RPHARMD AT 1810 ON 04/29/17 BY C. JESSUP, MLT.    Culture METHICILLIN RESISTANT STAPHYLOCOCCUS AUREUS (A)  Final   Report Status 05/01/2017 FINAL  Final   Organism ID, Bacteria METHICILLIN RESISTANT STAPHYLOCOCCUS AUREUS  Final      Susceptibility   Methicillin resistant staphylococcus aureus - MIC*    CIPROFLOXACIN >=8 RESISTANT Resistant     ERYTHROMYCIN >=8 RESISTANT Resistant     GENTAMICIN <=0.5 SENSITIVE Sensitive     OXACILLIN >=4 RESISTANT Resistant     TETRACYCLINE <=1 SENSITIVE Sensitive     VANCOMYCIN 1 SENSITIVE Sensitive     TRIMETH/SULFA <=10 SENSITIVE Sensitive     CLINDAMYCIN <=0.25 SENSITIVE Sensitive     RIFAMPIN <=0.5 SENSITIVE Sensitive     Inducible Clindamycin NEGATIVE Sensitive     * METHICILLIN RESISTANT STAPHYLOCOCCUS AUREUS  Blood Culture ID Panel (Reflexed)     Status: Abnormal   Collection Time: 04/29/17 12:08 AM  Result Value Ref Range Status   Enterococcus species NOT DETECTED NOT DETECTED Final   Listeria monocytogenes NOT DETECTED NOT DETECTED Final   Staphylococcus species DETECTED (A) NOT DETECTED Final    Comment: CRITICAL RESULT CALLED TO, READ BACK BY AND VERIFIED WITH: J. CARNEY, RPHARMD AT 1810 ON 04/29/17 BY C. JESSUP, MLT.    Staphylococcus aureus DETECTED (A) NOT DETECTED Final    Comment: Methicillin  (oxacillin)-resistant Staphylococcus aureus (MRSA). MRSA is predictably resistant to beta-lactam antibiotics (except ceftaroline). Preferred therapy is vancomycin unless clinically contraindicated. Patient requires contact precautions if  hospitalized. CRITICAL RESULT CALLED TO, READ BACK  BY AND VERIFIED WITH: J. CARNEY, RPHARMD AT 1810 ON 04/29/17 BY C. JESSUP, MLT.    Methicillin resistance DETECTED (A) NOT DETECTED Final    Comment: CRITICAL RESULT CALLED TO, READ BACK BY AND VERIFIED WITH: J. CARNEY, RPHARMD AT 1810 ON 04/29/17 BY C. JESSUP, MLT.    Streptococcus species NOT DETECTED NOT DETECTED Final   Streptococcus agalactiae NOT DETECTED NOT DETECTED Final   Streptococcus pneumoniae NOT DETECTED NOT DETECTED Final   Streptococcus pyogenes NOT DETECTED NOT DETECTED Final   Acinetobacter baumannii NOT DETECTED NOT DETECTED Final   Enterobacter cloacae complex NOT DETECTED NOT DETECTED Final   Escherichia coli NOT DETECTED NOT DETECTED Final   Klebsiella oxytoca NOT DETECTED NOT DETECTED Final   Klebsiella pneumoniae NOT DETECTED NOT DETECTED Final   Serratia marcescens NOT DETECTED NOT DETECTED Final   Carbapenem resistance NOT DETECTED NOT DETECTED Final   Haemophilus influenzae NOT DETECTED NOT DETECTED Final   Neisseria meningitidis NOT DETECTED NOT DETECTED Final   Pseudomonas aeruginosa NOT DETECTED NOT DETECTED Final   Candida albicans NOT DETECTED NOT DETECTED Final   Candida glabrata NOT DETECTED NOT DETECTED Final   Candida krusei NOT DETECTED NOT DETECTED Final   Candida parapsilosis NOT DETECTED NOT DETECTED Final   Candida tropicalis NOT DETECTED NOT DETECTED Final  MRSA PCR Screening     Status: Abnormal   Collection Time: 04/29/17  4:38 AM  Result Value Ref Range Status   MRSA by PCR POSITIVE (A) NEGATIVE Final    Comment:        The GeneXpert MRSA Assay (FDA approved for NASAL specimens only), is one component of a comprehensive MRSA colonization surveillance  program. It is not intended to diagnose MRSA infection nor to guide or monitor treatment for MRSA infections. RESULT CALLED TO, READ BACK BY AND VERIFIED WITH: RN R Arlester Marker [id number] MLM   Wound or Superficial Culture     Status: None   Collection Time: 04/29/17 12:10 PM  Result Value Ref Range Status   Specimen Description WOUND FOOT  Final   Special Requests NONE  Final   Gram Stain   Final    NO WBC SEEN RARE GRAM POSITIVE COCCI IN PAIRS IN SINGLES    Culture FEW METHICILLIN RESISTANT STAPHYLOCOCCUS AUREUS  Final   Report Status 05/01/2017 FINAL  Final   Organism ID, Bacteria METHICILLIN RESISTANT STAPHYLOCOCCUS AUREUS  Final      Susceptibility   Methicillin resistant staphylococcus aureus - MIC*    CIPROFLOXACIN >=8 RESISTANT Resistant     ERYTHROMYCIN >=8 RESISTANT Resistant     GENTAMICIN <=0.5 SENSITIVE Sensitive     OXACILLIN >=4 RESISTANT Resistant     TETRACYCLINE <=1 SENSITIVE Sensitive     VANCOMYCIN 1 SENSITIVE Sensitive     TRIMETH/SULFA <=10 SENSITIVE Sensitive     CLINDAMYCIN <=0.25 SENSITIVE Sensitive     RIFAMPIN <=0.5 SENSITIVE Sensitive     Inducible Clindamycin NEGATIVE Sensitive     * FEW METHICILLIN RESISTANT STAPHYLOCOCCUS AUREUS  Culture, blood (routine x 2)     Status: Abnormal   Collection Time: 04/30/17  9:20 AM  Result Value Ref Range Status   Specimen Description BLOOD RIGHT HAND  Final   Special Requests IN PEDIATRIC BOTTLE Blood Culture adequate volume  Final   Culture  Setup Time   Final    GRAM POSITIVE COCCI IN PEDIATRIC BOTTLE CRITICAL RESULT CALLED TO, READ BACK BY AND VERIFIED WITH: PARKEY RN AT 1141 ON [id number] BY SJW  Culture (A)  Final    STAPHYLOCOCCUS AUREUS SUSCEPTIBILITIES PERFORMED ON PREVIOUS CULTURE WITHIN THE LAST 5 DAYS.    Report Status 05/02/2017 FINAL  Final  Culture, blood (routine x 2)     Status: None (Preliminary result)   Collection Time: 04/30/17  9:27 AM  Result Value Ref Range Status   Specimen  Description BLOOD RIGHT HAND  Final   Special Requests IN PEDIATRIC BOTTLE Blood Culture adequate volume  Final   Culture NO GROWTH 3 DAYS  Final   Report Status PENDING  Incomplete      Radiology Studies: No results found.  Scheduled Meds: . abacavir-dolutegravir-lamiVUDine  1 tablet Oral QHS  . dapsone  100 mg Oral Daily  . darunavir-cobicistat  1 tablet Oral Q breakfast  . docusate sodium  100 mg Oral BID  . enoxaparin (LOVENOX) injection  55 mg Subcutaneous Daily  . insulin aspart  0-15 Units Subcutaneous TID WC  . insulin aspart  0-5 Units Subcutaneous QHS  . insulin glargine  20 Units Subcutaneous Q2200  . polyethylene glycol  17 g Oral Daily  . senna  1 tablet Oral BID   Continuous Infusions: . sodium chloride 10 mL/hr at 05/02/17 2246  . lactated ringers    . methocarbamol (ROBAXIN)  IV    . vancomycin Stopped (05/03/17 1243)     LOS: 5 days   Time spent: 25 minutes.  Vance Gather, MD [facility] Pager [phone]  If 7PM-7AM, please contact night-coverage [email or web address] Password [facility] 05/03/2017, 4:49 PM

## 2017-05-03 NOTE — Care Management Note (Signed)
Case Management Note  Patient Details  Name: Keith [name] MRN: [id number] Date of Birth: 09/15/1965  Subjective/Objective:       Pt from home with mother for right transmetatarsal  Amputation.  Pt states his mother is present 24/7 and able to assist him.  Pt states he has no equipment at home and could use a RW. According to notes, plan for PICC and home IV abx possible. Pt drives self to MD appointments and pharmacy.  No barriers to health care.          Action/Plan: Pt states he has no preference of [place] agency and would be fine with [facility] providing HHRN/infusion services.  CM will continue to follow for d/c needs.    Expected Discharge Date:                  Expected Discharge Plan:  [place]  In-House Referral:  NA  Discharge planning Services  CM Consult  Post Acute Care Choice:    Choice offered to:     DME Arranged:    DME Agency:     HH Arranged:    HH Agency:     Status of Service:  In process, will continue to follow  If discussed at Long Length of Stay Meetings, dates discussed:    Additional Comments:  Arley Phenix, RN 05/03/2017, 12:04 PM

## 2017-05-03 NOTE — Evaluation (Signed)
Physical Therapy Evaluation Patient Details Name: Keith [name] MRN: [id number] DOB: 08/26/1965 Today's Date: 05/03/2017   History of Present Illness  Ptis a 52 y.o.malewith medical history significant ofHIV onART(last CD4 210 with an undetectable viral load),type 2 diabetes mellitus, hypertension, CKDstage III and osteomyelitis of the right first metatarsal on chronic therapy with Augmentin whopresented to the ED with worsening distal plantar wound infection with underlying osteomyelitis.  Underwent transmetatarsal amputation on 05/02/17.    Clinical Impression  Patient presents with dependencies in gait and mobility due to recent amputation.  Patient will benefit from continued PT to progress mobility, improve balance and increase independence.  Patient demonstrates impulsivity and decreased safety awareness which may hinder progress.  Will continue to work with patient to progress mobility and determine home equipment needs.      Follow Up Recommendations Home health PT    Equipment Recommendations  Rolling walker with 5" wheels;Wheelchair (measurements PT)    Recommendations for Other Services       Precautions / Restrictions Precautions Precautions: Fall Restrictions Weight Bearing Restrictions: Yes RLE Weight Bearing: Non weight bearing Other Position/Activity Restrictions: strict NWB per [name]      Mobility  Bed Mobility Overal bed mobility: Modified Independent             General bed mobility comments: used railing to get to edge of bed  Transfers Overall transfer level: Needs assistance Equipment used: Rolling walker (2 wheeled) Transfers: Sit to/from Stand Sit to Stand: Min guard         General transfer comment: cueing for sequencing and hand placement  Ambulation/Gait Ambulation/Gait assistance: Min guard Ambulation Distance (Feet): 10 Feet Assistive device: Rolling walker (2 wheeled)   Gait velocity: decreased   General Gait Details:  patient did not maintain NWB on bed-to-chair transfer, but then ambulated 5 feet forward and back and able to maintain.  Patient does not want guarding.  PT explained that I MUST guard him during out of bed for his safety.    Stairs            Wheelchair Mobility    Modified Rankin (Stroke Patients Only)       Balance Overall balance assessment: Needs assistance Sitting-balance support: No upper extremity supported;Feet supported Sitting balance-Leahy Scale: Good     Standing balance support: Bilateral upper extremity supported;During functional activity Standing balance-Leahy Scale: Poor Standing balance comment: reliant on RW                             Pertinent Vitals/Pain Pain Assessment: No/denies pain    Home Living Family/patient expects to be discharged to:: Private residence Living Arrangements: Parent Available Help at Discharge: Family;Available PRN/intermittently Type of Home: Apartment Home Access: Stairs to enter   Entrance Stairs-Number of Steps: 2 Home Layout: One level Home Equipment: None Additional Comments: now lives with mother    Prior Function Level of Independence: Independent               Hand Dominance        Extremity/Trunk Assessment   Upper Extremity Assessment Upper Extremity Assessment: Overall WFL for tasks assessed    Lower Extremity Assessment Lower Extremity Assessment: Overall WFL for tasks assessed    Cervical / Trunk Assessment Cervical / Trunk Assessment: Normal  Communication   Communication: No difficulties  Cognition Arousal/Alertness: Awake/alert Behavior During Therapy: Impulsive Overall Cognitive Status: Within Functional Limits for tasks assessed  General Comments: definitely impulsive and decreased safety awareness; family indicated this is baseline      General Comments      Exercises     Assessment/Plan    PT Assessment  Patient needs continued PT services  PT Problem List Decreased strength;Decreased activity tolerance;Decreased balance;Decreased mobility;Decreased knowledge of use of DME;Decreased safety awareness       PT Treatment Interventions DME instruction;Gait training;Stair training;Functional mobility training;Therapeutic activities;Therapeutic exercise;Balance training;Patient/family education    PT Goals (Current goals can be found in the Care Plan section)  Acute Rehab PT Goals Patient Stated Goal: go home PT Goal Formulation: With patient Time For Goal Achievement: 05/10/17 Potential to Achieve Goals: Good    Frequency Min 5X/week   Barriers to discharge Decreased caregiver support      Co-evaluation               AM-PAC PT "6 Clicks" Daily Activity  Outcome Measure Difficulty turning over in bed (including adjusting bedclothes, sheets and blankets)?: None Difficulty moving from lying on back to sitting on the side of the bed? : A Little Difficulty sitting down on and standing up from a chair with arms (e.g., wheelchair, bedside commode, etc,.)?: A Little Help needed moving to and from a bed to chair (including a wheelchair)?: A Little Help needed walking in hospital room?: A Little Help needed climbing 3-5 steps with a railing? : Total 6 Click Score: 17    End of Session Equipment Utilized During Treatment: Gait belt Activity Tolerance: Patient tolerated treatment well Patient left: in chair;with family/visitor present Nurse Communication: Mobility status PT Visit Diagnosis: Unsteadiness on feet (R26.81);Other abnormalities of gait and mobility (R26.89)    Time: 5465-0354 PT Time Calculation (min) (ACUTE ONLY): 22 min   Charges:   PT Evaluation $PT Eval Moderate Complexity: 1 Mod     PT G Codes:       2017/05/22 [facility], PT [phone]    Shanna Cisco 05-22-2017, 3:24 PM

## 2017-05-04 LAB — BASIC METABOLIC PANEL
Anion gap: 13 (ref 5–15)
BUN: 9 mg/dL (ref 6–20)
CO2: 25 mmol/L (ref 22–32)
Calcium: 8.9 mg/dL (ref 8.9–10.3)
Chloride: 97 mmol/L — ABNORMAL LOW (ref 101–111)
Creatinine, Ser: 1.48 mg/dL — ABNORMAL HIGH (ref 0.61–1.24)
GFR calc Af Amer: >60 mL/min (ref >60)
GFR calc non Af Amer: 53 mL/min — ABNORMAL LOW (ref >60)
Glucose, Bld: 112 mg/dL — ABNORMAL HIGH (ref 65–99)
Potassium: 4 mmol/L (ref 3.5–5.1)
Sodium: 135 mmol/L (ref 135–145)

## 2017-05-04 LAB — GLUCOSE, CAPILLARY
Glucose-Capillary: 118 mg/dL — ABNORMAL HIGH (ref 65–99)
Glucose-Capillary: 122 mg/dL — ABNORMAL HIGH (ref 65–99)
Glucose-Capillary: 129 mg/dL — ABNORMAL HIGH (ref 65–99)
Glucose-Capillary: 120 mg/dL — ABNORMAL HIGH (ref 65–99)

## 2017-05-04 MED ORDER — VANCOMYCIN HCL IN DEXTROSE 750-5 MG/150ML-% IV SOLN
750.0000 mg | Freq: Two times a day (BID) | INTRAVENOUS | Status: DC
  Administered 2017-05-04 – 2017-05-06 (×5): 750 mg via INTRAVENOUS
  Filled 2017-05-04 (×6): qty 150

## 2017-05-04 NOTE — Progress Notes (Signed)
PROGRESS NOTE  Keith [name]  QZR:[id number] DOB: [birth year] DOA: 04/28/2017 PCP: Scot Jun, FNP   Brief Narrative: Keith [name] is a 52 y.o. male with a history of HIV on ART (last CD4 210, undetectable VL), T2DM, HTN, stage III CKD, and chronic right foot osteomyelitis who presented to the ED 1/21 with worsening distal plantar pain and fever/chills concerning for worsening wound infection/osteomyelitis despite outpatient augmentin. He was tachycardic, afebrile with leukocytosis  (WBC 13.4) and XR demonstrating progressive demineralization in the first metatarsal head suggesting osteomyelitis. CRP found to be 14.9, ESR 140, lactic acid reassuringly normal. Broad IV abx started and [name] contacted for orthopedic consultation, who performed right TMA 05/02/2017. Blood cultures grew MRSA, ID consulted, TTE and TEE without signs of vegetation. Repeat blood cultures prior to the surgery were positive, so these are repeated 1/26.   Assessment & Plan: Active Problems:   Osteomyelitis of metatarsal (HCC)   Wound infection   Bacteremia  Sepsis due MRSA bacteremia and acute on chronic osteomyelitis of right first metatarsal with overlying cellulitis of right foot: s/p TMA 1/25 per [name]. MRI showed significant worsening of osteo, myositis, and abscess. ESR 140, CRP 14.9. Has been followed closely by [name]. TEE negative. - Continue vancomycin.  - Monitor repeat Cx's from 1/26. If negative x48 hrs will start midline IV and continue IV abx for 2 weeks per ID recommendations, CM consulted as PT recommends HH-PT.   - Continue oxycodone as ordered, bowel regimen.   HIV on ART: CD4 count down to 80 (?transient due to infxn), VL undetectable. - Continue Triumeq, Prezcobix - Started dapsone for OI ppx.   IDT2DM:  - Continue lantus 20u qHS, SSI qAC/HS. CBGs well controlled. - Pt has very little insight into effects of diet. Consulted RD and diabetes coordinator  Stage III CKD: Cr appears  stable to baseline.  - Monitor BMP, UOP - Renally dose medications, caution with vancomycin  Hematuria: Uncertain etiology. Hgb near baseline.  - Defer further evaluation to outpatient setting.   Protein gap: Protein gap of 5.4 present is likely secondary to HIV infection.  - Can consider further workup for paraproteinemia in the outpatient setting.  DVT prophylaxis: Lovenox Code Status: Full Family Communication: None at bedside Disposition Plan: Uncertain  Consultants:   Orthopedic surgery, [name]  ID  Cardiology  Procedures:   Echo 1/23 Study Conclusions - Left ventricle: The cavity size was normal. Wall thickness was   normal. Systolic function was normal. The estimated ejection   fraction was in the range of 55% to 60%. Wall motion was normal;   there were no regional wall motion abnormalities. Doppler   parameters are consistent with abnormal left ventricular   relaxation (grade 1 diastolic dysfunction). - Mitral valve: There was trivial regurgitation.  Impressions: - There was no evidence of a vegetation.  Transesophageal echocardiogram 05/02/2017: The case was done in the OR just following the partial foot amputation by [name]  Left Ventrical:  Normal LV function  Mitral Valve: normal,  No vegetation   Aortic Valve: normal, no vegetation   Tricuspid Valve: normal , no vegetation  Pulmonic Valve: normal   Left Atrium/ Left atrial appendage: normal , no thrombi   Atrial septum:  No ASD or PFO by color flow   Aorta:  Normal   Complications: No apparent complications Patient did tolerate procedure well.  Antimicrobials:  Vancomycin 1/21 >>   Cefepime 1/21 - 1/22  Zosyn 1/22 >>  Subjective: Pain better controlled with increased oxycodone dose. No sedation noted by pt.   Objective: BP 121/68 (BP Location: Left Arm)   Pulse 91   Temp 99.5 F (37.5 C) (Oral)   Resp 18   Ht 5' 9.02" (1.753 m)   Wt 110.5 kg (243 lb 9.7 oz)    SpO2 98%   BMI 35.96 kg/m  Gen: 52 y.o. male in no distress Pulm: Non-labored breathing room air. Clear to auscultation bilaterally. CV: Regular rate and rhythm. No murmur, rub, or gallop. No JVD, no significant pedal edema. GI: Abdomen soft, non-tender, non-distended, with normoactive bowel sounds. No organomegaly or masses felt. Skin: Right foot TMA site with vac in place. +pulse. Neuro: Alert and oriented. Decreased sensation bilateral distal LE's.  Psych: Judgement and insight appear intact.   Data Reviewed: I have personally reviewed following labs and imaging studies  CBC: Recent Labs  Lab 04/28/17 1444 04/29/17 0644 04/30/17 0923 05/01/17 0752 05/03/17 0545  WBC 13.4* 11.9* 13.9* 9.6 10.3  NEUTROABS 10.3* 9.1*  --   --   --   HGB 9.3* 8.9* 8.9* 9.2* 8.4*  HCT 28.1* 28.0* 27.3* 28.1* 26.7*  MCV 85.7 85.6 84.5 83.6 85.9  PLT 363 342 389 400 374   Basic Metabolic Panel: Recent Labs  Lab 04/28/17 2347 04/29/17 0640 04/30/17 0923 05/01/17 0752 05/03/17 0545 05/04/17 0615  NA  --  134* 136 136 133* 135  K  --  3.6 3.5 3.3* 3.9 4.0  CL  --  100* 100* 99* 97* 97*  CO2  --  21* _0 GLUCOSE  --  120* 95 99 125* 112*  BUN  --  _1 CREATININE  --  1.47* 1.56* 1.21 1.36* 1.48*  CALCIUM  --  8.9 9.1 9.0 8.7* 8.9  MG 1.7  --   --   --   --   --   PHOS 3.1  --   --   --   --   --     Recent Results (from the past 240 hour(s))  Culture, blood (routine x 2)     Status: Abnormal   Collection Time: 04/29/17 12:00 AM  Result Value Ref Range Status   Specimen Description BLOOD RIGHT ANTECUBITAL  Final   Special Requests IN PEDIATRIC BOTTLE Blood Culture adequate volume  Final   Culture  Setup Time   Final    GRAM POSITIVE COCCI IN CLUSTERS IN PEDIATRIC BOTTLE CRITICAL RESULT CALLED TO, READ BACK BY AND VERIFIED WITH: J. CARNEY, RPHARMD AT 1810 ON 04/29/17 BY C. JESSUP, MLT.    Culture (A)  Final    STAPHYLOCOCCUS AUREUS SUSCEPTIBILITIES PERFORMED ON  PREVIOUS CULTURE WITHIN THE LAST 5 DAYS.    Report Status 05/01/2017 FINAL  Final  Culture, blood (routine x 2)     Status: Abnormal   Collection Time: 04/29/17 12:08 AM  Result Value Ref Range Status   Specimen Description BLOOD RIGHT ANTECUBITAL  Final   Special Requests IN PEDIATRIC BOTTLE Blood Culture adequate volume  Final   Culture  Setup Time   Final    GRAM POSITIVE COCCI IN PEDIATRIC BOTTLE CRITICAL RESULT CALLED TO, READ BACK BY AND VERIFIED WITH: J. CARNEY, RPHARMD AT 1810 ON 04/29/17 BY C. JESSUP, MLT.    Culture METHICILLIN RESISTANT STAPHYLOCOCCUS AUREUS (A)  Final   Report Status 05/01/2017 FINAL  Final   Organism ID, Bacteria METHICILLIN RESISTANT STAPHYLOCOCCUS AUREUS  Final  Susceptibility   Methicillin resistant staphylococcus aureus - MIC*    CIPROFLOXACIN >=8 RESISTANT Resistant     ERYTHROMYCIN >=8 RESISTANT Resistant     GENTAMICIN <=0.5 SENSITIVE Sensitive     OXACILLIN >=4 RESISTANT Resistant     TETRACYCLINE <=1 SENSITIVE Sensitive     VANCOMYCIN 1 SENSITIVE Sensitive     TRIMETH/SULFA <=10 SENSITIVE Sensitive     CLINDAMYCIN <=0.25 SENSITIVE Sensitive     RIFAMPIN <=0.5 SENSITIVE Sensitive     Inducible Clindamycin NEGATIVE Sensitive     * METHICILLIN RESISTANT STAPHYLOCOCCUS AUREUS  Blood Culture ID Panel (Reflexed)     Status: Abnormal   Collection Time: 04/29/17 12:08 AM  Result Value Ref Range Status   Enterococcus species NOT DETECTED NOT DETECTED Final   Listeria monocytogenes NOT DETECTED NOT DETECTED Final   Staphylococcus species DETECTED (A) NOT DETECTED Final    Comment: CRITICAL RESULT CALLED TO, READ BACK BY AND VERIFIED WITH: J. CARNEY, RPHARMD AT 1810 ON 04/29/17 BY C. JESSUP, MLT.    Staphylococcus aureus DETECTED (A) NOT DETECTED Final    Comment: Methicillin (oxacillin)-resistant Staphylococcus aureus (MRSA). MRSA is predictably resistant to beta-lactam antibiotics (except ceftaroline). Preferred therapy is vancomycin unless  clinically contraindicated. Patient requires contact precautions if  hospitalized. CRITICAL RESULT CALLED TO, READ BACK BY AND VERIFIED WITH: J. CARNEY, RPHARMD AT 1810 ON 04/29/17 BY C. JESSUP, MLT.    Methicillin resistance DETECTED (A) NOT DETECTED Final    Comment: CRITICAL RESULT CALLED TO, READ BACK BY AND VERIFIED WITH: J. CARNEY, RPHARMD AT 1810 ON 04/29/17 BY C. JESSUP, MLT.    Streptococcus species NOT DETECTED NOT DETECTED Final   Streptococcus agalactiae NOT DETECTED NOT DETECTED Final   Streptococcus pneumoniae NOT DETECTED NOT DETECTED Final   Streptococcus pyogenes NOT DETECTED NOT DETECTED Final   Acinetobacter baumannii NOT DETECTED NOT DETECTED Final   Enterobacter cloacae complex NOT DETECTED NOT DETECTED Final   Escherichia coli NOT DETECTED NOT DETECTED Final   Klebsiella oxytoca NOT DETECTED NOT DETECTED Final   Klebsiella pneumoniae NOT DETECTED NOT DETECTED Final   Serratia marcescens NOT DETECTED NOT DETECTED Final   Carbapenem resistance NOT DETECTED NOT DETECTED Final   Haemophilus influenzae NOT DETECTED NOT DETECTED Final   Neisseria meningitidis NOT DETECTED NOT DETECTED Final   Pseudomonas aeruginosa NOT DETECTED NOT DETECTED Final   Candida albicans NOT DETECTED NOT DETECTED Final   Candida glabrata NOT DETECTED NOT DETECTED Final   Candida krusei NOT DETECTED NOT DETECTED Final   Candida parapsilosis NOT DETECTED NOT DETECTED Final   Candida tropicalis NOT DETECTED NOT DETECTED Final  MRSA PCR Screening     Status: Abnormal   Collection Time: 04/29/17  4:38 AM  Result Value Ref Range Status   MRSA by PCR POSITIVE (A) NEGATIVE Final    Comment:        The GeneXpert MRSA Assay (FDA approved for NASAL specimens only), is one component of a comprehensive MRSA colonization surveillance program. It is not intended to diagnose MRSA infection nor to guide or monitor treatment for MRSA infections. RESULT CALLED TO, READ BACK BY AND VERIFIED WITH: RN [place] [phone] MLM   Wound or Superficial Culture     Status: None   Collection Time: 04/29/17 12:10 PM  Result Value Ref Range Status   Specimen Description WOUND FOOT  Final   Special Requests NONE  Final   Gram Stain   Final    NO WBC SEEN RARE GRAM POSITIVE COCCI  IN PAIRS IN SINGLES    Culture FEW METHICILLIN RESISTANT STAPHYLOCOCCUS AUREUS  Final   Report Status 05/01/2017 FINAL  Final   Organism ID, Bacteria METHICILLIN RESISTANT STAPHYLOCOCCUS AUREUS  Final      Susceptibility   Methicillin resistant staphylococcus aureus - MIC*    CIPROFLOXACIN >=8 RESISTANT Resistant     ERYTHROMYCIN >=8 RESISTANT Resistant     GENTAMICIN <=0.5 SENSITIVE Sensitive     OXACILLIN >=4 RESISTANT Resistant     TETRACYCLINE <=1 SENSITIVE Sensitive     VANCOMYCIN 1 SENSITIVE Sensitive     TRIMETH/SULFA <=10 SENSITIVE Sensitive     CLINDAMYCIN <=0.25 SENSITIVE Sensitive     RIFAMPIN <=0.5 SENSITIVE Sensitive     Inducible Clindamycin NEGATIVE Sensitive     * FEW METHICILLIN RESISTANT STAPHYLOCOCCUS AUREUS  Culture, blood (routine x 2)     Status: Abnormal   Collection Time: 04/30/17  9:20 AM  Result Value Ref Range Status   Specimen Description BLOOD RIGHT HAND  Final   Special Requests IN PEDIATRIC BOTTLE Blood Culture adequate volume  Final   Culture  Setup Time   Final    GRAM POSITIVE COCCI IN PEDIATRIC BOTTLE CRITICAL RESULT CALLED TO, READ BACK BY AND VERIFIED WITH: PARKEY RN AT 1141 ON [id number] BY SJW    Culture (A)  Final    STAPHYLOCOCCUS AUREUS SUSCEPTIBILITIES PERFORMED ON PREVIOUS CULTURE WITHIN THE LAST 5 DAYS.    Report Status 05/02/2017 FINAL  Final  Culture, blood (routine x 2)     Status: None (Preliminary result)   Collection Time: 04/30/17  9:27 AM  Result Value Ref Range Status   Specimen Description BLOOD RIGHT HAND  Final   Special Requests IN PEDIATRIC BOTTLE Blood Culture adequate volume  Final   Culture NO GROWTH 4 DAYS  Final   Report Status PENDING   Incomplete  Culture, blood (routine x 2)     Status: None (Preliminary result)   Collection Time: 05/03/17  3:50 PM  Result Value Ref Range Status   Specimen Description BLOOD LEFT ANTECUBITAL  Final   Special Requests   Final    BOTTLES DRAWN AEROBIC ONLY Blood Culture adequate volume   Culture NO GROWTH < 24 HOURS  Final   Report Status PENDING  Incomplete  Culture, blood (routine x 2)     Status: None (Preliminary result)   Collection Time: 05/03/17  3:54 PM  Result Value Ref Range Status   Specimen Description BLOOD LEFT HAND  Final   Special Requests   Final    BOTTLES DRAWN AEROBIC ONLY Blood Culture adequate volume   Culture NO GROWTH < 24 HOURS  Final   Report Status PENDING  Incomplete      Radiology Studies: No results found.  Scheduled Meds: . abacavir-dolutegravir-lamiVUDine  1 tablet Oral QHS  . dapsone  100 mg Oral Daily  . darunavir-cobicistat  1 tablet Oral Q breakfast  . docusate sodium  100 mg Oral BID  . enoxaparin (LOVENOX) injection  55 mg Subcutaneous Daily  . insulin aspart  0-15 Units Subcutaneous TID WC  . insulin aspart  0-5 Units Subcutaneous QHS  . insulin glargine  20 Units Subcutaneous Q2200  . polyethylene glycol  17 g Oral Daily  . senna  1 tablet Oral BID   Continuous Infusions: . sodium chloride 10 mL/hr at 05/02/17 2246  . lactated ringers    . methocarbamol (ROBAXIN)  IV    . vancomycin       LOS:  6 days   Time spent: 25 minutes.  Vance Gather, MD [facility] Pager [phone]  If 7PM-7AM, please contact night-coverage [email or web address] Password [facility] 05/04/2017, 2:28 PM

## 2017-05-04 NOTE — Progress Notes (Signed)
Physical Therapy Treatment Patient Details Name: Keith [name] MRN: [id number] DOB: 1965/10/23 Today's Date: 05/04/2017    History of Present Illness Ptis a 52 y.o.malewith medical history significant ofHIV onART(last CD4 210 with an undetectable viral load),type 2 diabetes mellitus, hypertension, CKDstage III and osteomyelitis of the right first metatarsal on chronic therapy with Augmentin whopresented to the ED with worsening distal plantar wound infection with underlying osteomyelitis.  Underwent transmetatarsal amputation on 05/02/17.      PT Comments    PT session today focusing on improving independence with functional mobility with good safety awareness and maintenance of WB precautions. Gait training with use of knee scooter today - patient becoming frustrated with guarding and cueing by PT with PT educating patient that this was a must for his safety. Following gait training patient stating that he would rather use a RW than knee scooter. Some difficulty maintaining NWB with sit to stand transfer requiring VC to ensure no weight placed through extremity. Will continue to follow acutely to maximize functional mobility prior to d/c.    Follow Up Recommendations  Home health PT     Equipment Recommendations  Rolling walker with 5" wheels;Wheelchair (measurements PT)    Recommendations for Other Services       Precautions / Restrictions Precautions Precautions: Fall Restrictions Weight Bearing Restrictions: Yes RLE Weight Bearing: Non weight bearing Other Position/Activity Restrictions: strict NWB per [name]    Mobility  Bed Mobility Overal bed mobility: Modified Independent             General bed mobility comments: used railing to achieve EOB  Transfers Overall transfer level: Needs assistance Equipment used: Rolling walker (2 wheeled) Transfers: Sit to/from Stand Sit to Stand: Min guard         General transfer comment: VC for hand placement for  safety as patient tends to pull up from RW; poor maintenance of NWB status on R LE during transfer  Ambulation/Gait Ambulation/Gait assistance: Min guard Ambulation Distance (Feet): 60 Feet Assistive device: (knee scooter)       General Gait Details: able to use knee scooter - easily frustrated/agitated when PT tries to guard and cue him with sequencing and obstacle navigation for safety   Stairs            Wheelchair Mobility    Modified Rankin (Stroke Patients Only)       Balance Overall balance assessment: Needs assistance Sitting-balance support: No upper extremity supported;Feet supported Sitting balance-Leahy Scale: Good     Standing balance support: Bilateral upper extremity supported;During functional activity Standing balance-Leahy Scale: Poor Standing balance comment: heavy use of UE and forward flexed posture                            Cognition Arousal/Alertness: Awake/alert Behavior During Therapy: Agitated;Impulsive Overall Cognitive Status: Within Functional Limits for tasks assessed                                 General Comments: poor safety awareness; becomes agitated when PT tries to use gait belt and gaurd patient for general safety      Exercises      General Comments        Pertinent Vitals/Pain Pain Assessment: No/denies pain    Home Living  Prior Function            PT Goals (current goals can now be found in the care plan section) Acute Rehab PT Goals Patient Stated Goal: go home PT Goal Formulation: With patient Time For Goal Achievement: 05/10/17 Potential to Achieve Goals: Good Progress towards PT goals: Progressing toward goals    Frequency    Min 5X/week      PT Plan Current plan remains appropriate    Co-evaluation              AM-PAC PT "6 Clicks" Daily Activity  Outcome Measure  Difficulty turning over in bed (including adjusting  bedclothes, sheets and blankets)?: None Difficulty moving from lying on back to sitting on the side of the bed? : A Little Difficulty sitting down on and standing up from a chair with arms (e.g., wheelchair, bedside commode, etc,.)?: A Little Help needed moving to and from a bed to chair (including a wheelchair)?: A Little Help needed walking in hospital room?: A Little Help needed climbing 3-5 steps with a railing? : Total 6 Click Score: 17    End of Session Equipment Utilized During Treatment: Gait belt Activity Tolerance: Patient tolerated treatment well Patient left: in bed;with call bell/phone within reach Nurse Communication: Mobility status PT Visit Diagnosis: Unsteadiness on feet (R26.81);Other abnormalities of gait and mobility (R26.89)     Time: 7616-0737 PT Time Calculation (min) (ACUTE ONLY): 24 min  Charges:  $Gait Training: 8-22 mins $Therapeutic Activity: 8-22 mins                    G Codes:       Lanney Gins, PT, DPT 05/04/17 2:47 PM

## 2017-05-04 NOTE — Progress Notes (Signed)
Pharmacy Antibiotic Note  Keith [name] is a 52 y.o. male admitted on 04/28/2017 with foot pain, found to have osteomyelitis and subsequent MRSA bacteremia.  ECHO normal and Ortho plans for amputation tomorrow.  Pharmacy has been consulted for vancomycin dosing.  Patient's SCr is starting to trend up.  Would like to repeat vancomycin trough but dose was missed on 05/02/17 and today's AM dose was delayed by ~7 hours.  Afebrile, WBC WNL.   Plan: Continue vanc 750mg  IV Q12H Monitor renal fxn, clinical progress, repeat vanc trough tomorrow   Height: 5' 9.02" (175.3 cm) Weight: 243 lb 9.7 oz (110.5 kg) IBW/kg (Calculated) : 70.74  Temp (24hrs), Avg:98.8 F (37.1 C), Min:98.3 F (36.8 C), Max:99.5 F (37.5 C)  Recent Labs  Lab 04/28/17 1444 04/28/17 1501 04/28/17 2027 04/29/17 0640 04/29/17 0644 04/29/17 0726 04/30/17 0923 05/01/17 0752 05/01/17 1108 05/03/17 0545 05/04/17 0615  WBC 13.4*  --   --   --  11.9*  --  13.9* 9.6  --  10.3  --   CREATININE 1.56*  --   --  1.47*  --   --  1.56* 1.21  --  1.36* 1.48*  LATICACIDVEN  --  0.98 1.39  --   --  0.6  --   --   --   --   --   VANCOTROUGH  --   --   --   --   --   --   --   --  16  --   --     Estimated Creatinine Clearance: 71.5 mL/min (A) (by C-G formula based on SCr of 1.48 mg/dL (H)).    Allergies  Allergen Reactions  . Sulfa Antibiotics Itching    HIV on Triumeq, Prezcobix - CD4 80 - Dapsone for PCP px  Vanc 1/21 >> Cefepime 1/21 >>1/22 Zosyn 1/22>>1/22 Augmentin PTA  1/24 VT = 16 mcg/mL on 750mg  q12 >> no change (SCr 1.21)  1/22 MRSA PCR - positive 1/22 BCx - MRSA 1/22 foot wound fx (superficial) - MRSA 1/23 BCx - 1 of 2 Staph aureus 1/26 BCx -    Azelia Reiger D. Mina Marble, PharmD, BCPS Pager:  [phone] 05/04/2017, 8:54 AM

## 2017-05-05 DIAGNOSIS — N183 Chronic kidney disease, stage 3 (moderate): Secondary | ICD-10-CM

## 2017-05-05 DIAGNOSIS — Z89421 Acquired absence of other right toe(s): Secondary | ICD-10-CM

## 2017-05-05 DIAGNOSIS — Z978 Presence of other specified devices: Secondary | ICD-10-CM

## 2017-05-05 DIAGNOSIS — I340 Nonrheumatic mitral (valve) insufficiency: Secondary | ICD-10-CM

## 2017-05-05 DIAGNOSIS — M86671 Other chronic osteomyelitis, right ankle and foot: Secondary | ICD-10-CM

## 2017-05-05 DIAGNOSIS — I129 Hypertensive chronic kidney disease with stage 1 through stage 4 chronic kidney disease, or unspecified chronic kidney disease: Secondary | ICD-10-CM

## 2017-05-05 DIAGNOSIS — B9562 Methicillin resistant Staphylococcus aureus infection as the cause of diseases classified elsewhere: Secondary | ICD-10-CM

## 2017-05-05 LAB — BASIC METABOLIC PANEL
Anion gap: 10 (ref 5–15)
BUN: 9 mg/dL (ref 6–20)
CO2: 26 mmol/L (ref 22–32)
Calcium: 8.9 mg/dL (ref 8.9–10.3)
Chloride: 101 mmol/L (ref 101–111)
Creatinine, Ser: 1.29 mg/dL — ABNORMAL HIGH (ref 0.61–1.24)
GFR calc Af Amer: >60 mL/min (ref >60)
GFR calc non Af Amer: >60 mL/min (ref >60)
Glucose, Bld: 89 mg/dL (ref 65–99)
Potassium: 3.8 mmol/L (ref 3.5–5.1)
Sodium: 137 mmol/L (ref 135–145)

## 2017-05-05 LAB — CBC
HCT: 26.1 % — ABNORMAL LOW (ref 39.0–52.0)
Hemoglobin: 8.1 g/dL — ABNORMAL LOW (ref 13.0–17.0)
MCH: 26.8 pg (ref 26.0–34.0)
MCHC: 31 g/dL (ref 30.0–36.0)
MCV: 86.4 fL (ref 78.0–100.0)
Platelets: 449 10*3/uL — ABNORMAL HIGH (ref 150–400)
RBC: 3.02 MIL/uL — ABNORMAL LOW (ref 4.22–5.81)
RDW: 14.6 % (ref 11.5–15.5)
WBC: 10.8 10*3/uL — ABNORMAL HIGH (ref 4.0–10.5)

## 2017-05-05 LAB — VANCOMYCIN, TROUGH: Vancomycin Tr: 15 ug/mL (ref 15–20)

## 2017-05-05 LAB — CULTURE, BLOOD (ROUTINE X 2)
Culture: NO GROWTH
Special Requests: ADEQUATE

## 2017-05-05 LAB — GLUCOSE, CAPILLARY
Glucose-Capillary: 93 mg/dL (ref 65–99)
Glucose-Capillary: 115 mg/dL — ABNORMAL HIGH (ref 65–99)
Glucose-Capillary: 172 mg/dL — ABNORMAL HIGH (ref 65–99)
Glucose-Capillary: 106 mg/dL — ABNORMAL HIGH (ref 65–99)

## 2017-05-05 MED ORDER — OXYCODONE HCL 5 MG PO TABS
15.0000 mg | ORAL_TABLET | ORAL | Status: DC | PRN
  Administered 2017-05-05 – 2017-05-06 (×4): 15 mg via ORAL
  Filled 2017-05-05 (×4): qty 3

## 2017-05-05 NOTE — Progress Notes (Signed)
Physical Therapy Treatment Patient Details Name: Keith [name] MRN: [id number] DOB: [birth year] Today's Date: 05/05/2017    History of Present Illness Ptis a 52 y.o.malewith medical history significant ofHIV onART(last CD4 210 with an undetectable viral load),type 2 diabetes mellitus, hypertension, CKDstage III and osteomyelitis of the right first metatarsal on chronic therapy with Augmentin whopresented to the ED with worsening distal plantar wound infection with underlying osteomyelitis.  Underwent transmetatarsal amputation on 05/02/17.      PT Comments    Continuing work on functional mobility and activity tolerance;  Overall able to take steps and manage household distances with RW maintaining NWB RLE; a few small losses of balance needing close guard for safety; Will plan for stair training tomorrow   Follow Up Recommendations  Home health PT     Equipment Recommendations  Rolling walker with 5" wheels;Wheelchair (measurements PT)    Recommendations for Other Services       Precautions / Restrictions Precautions Precautions: Fall Restrictions RLE Weight Bearing: Non weight bearing Other Position/Activity Restrictions: strict NWB per [name]    Mobility  Bed Mobility Overal bed mobility: Modified Independent             General bed mobility comments: used railing to achieve EOB  Transfers Overall transfer level: Needs assistance Equipment used: Rolling walker (2 wheeled) Transfers: Sit to/from Stand Sit to Stand: Min guard         General transfer comment: VC for hand placement for safety as patient tends to pull up from RW; poor maintenance of NWB status on R LE during transfer  Ambulation/Gait Ambulation/Gait assistance: Min guard Ambulation Distance (Feet): 30 Feet Assistive device: Rolling walker (2 wheeled) Gait Pattern/deviations: (hop-to pattern) Gait velocity: decreased   General Gait Details: Cues to support self/body weight on RW for  smoother stepping; abel to maintain NWB with hop-steps with RW for short distance; Adjusted height of RW lower for optimal fit   Stairs            Wheelchair Mobility    Modified Rankin (Stroke Patients Only)       Balance     Sitting balance-Leahy Scale: Good       Standing balance-Leahy Scale: Poor Standing balance comment: heavy use of UE and forward flexed posture                            Cognition Arousal/Alertness: Awake/alert Behavior During Therapy: WFL for tasks assessed/performed(slightly restless) Overall Cognitive Status: Within Functional Limits for tasks assessed                                 General Comments: poor safety awareness; becomes agitated when PT tries to use gait belt and gaurd patient for general safety      Exercises      General Comments General comments (skin integrity, edema, etc.): Discussed plan for stair training for tomorrow with pt and his mother      Pertinent Vitals/Pain Pain Assessment: No/denies pain    Home Living                      Prior Function            PT Goals (current goals can now be found in the care plan section) Acute Rehab PT Goals Patient Stated Goal: go home PT Goal Formulation: With patient Time  For Goal Achievement: 05/10/17 Potential to Achieve Goals: Good Progress towards PT goals: Progressing toward goals    Frequency    Min 5X/week      PT Plan Current plan remains appropriate    Co-evaluation              AM-PAC PT "6 Clicks" Daily Activity  Outcome Measure  Difficulty turning over in bed (including adjusting bedclothes, sheets and blankets)?: None Difficulty moving from lying on back to sitting on the side of the bed? : A Little Difficulty sitting down on and standing up from a chair with arms (e.g., wheelchair, bedside commode, etc,.)?: A Little Help needed moving to and from a bed to chair (including a wheelchair)?: A  Little Help needed walking in hospital room?: A Little Help needed climbing 3-5 steps with a railing? : A Lot 6 Click Score: 18    End of Session Equipment Utilized During Treatment: Gait belt Activity Tolerance: Patient tolerated treatment well Patient left: in bed;with call bell/phone within reach Nurse Communication: Mobility status PT Visit Diagnosis: Unsteadiness on feet (R26.81);Other abnormalities of gait and mobility (R26.89)     Time: 1442-1500(Start and end times are approximate) PT Time Calculation (min) (ACUTE ONLY): 18 min  Charges:  $Gait Training: 8-22 mins                    G Codes:       Roney Marion, PT  Acute Rehabilitation Services Pager [phone] Office Central 05/05/2017, 4:37 PM

## 2017-05-05 NOTE — Plan of Care (Signed)
  Clinical Measurements: Ability to maintain clinical measurements within normal limits will improve 05/05/2017 1558 - Progressing by Darletta Moll, RN   Clinical Measurements: Will remain free from infection 05/05/2017 1558 - Progressing by Darletta Moll, RN   Clinical Measurements: Diagnostic test results will improve 05/05/2017 1558 - Progressing by Darletta Moll, RN   Clinical Measurements: Respiratory complications will improve 05/05/2017 1558 - Progressing by Darletta Moll, RN   Clinical Measurements: Cardiovascular complication will be avoided 05/05/2017 1558 - Progressing by Darletta Moll, RN   Activity: Risk for activity intolerance will decrease 05/05/2017 1558 - Progressing by Darletta Moll, RN   Nutrition: Adequate nutrition will be maintained 05/05/2017 1558 - Progressing by Darletta Moll, RN   Coping: Level of anxiety will decrease 05/05/2017 1558 - Progressing by Darletta Moll, RN   Elimination: Will not experience complications related to bowel motility 05/05/2017 1558 - Progressing by Darletta Moll, RN   Elimination: Will not experience complications related to urinary retention 05/05/2017 1558 - Progressing by Darletta Moll, RN   Safety: Ability to remain free from injury will improve 05/05/2017 1558 - Progressing by Darletta Moll, RN   Safety: Ability to remain free from injury will improve 05/05/2017 1558 - Progressing by Darletta Moll, RN   Skin Integrity: Risk for impaired skin integrity will decrease 05/05/2017 1558 - Progressing by Darletta Moll, RN

## 2017-05-05 NOTE — Progress Notes (Signed)
[name], male   DOB: 06-12-1965, 52 y.o.   MRN: [id number] Postoperative day 3 transmetatarsal amputation on the right.  The wound VAC battery was uncharged and not working, I plugged the wound VAC in.  The battery must be charged for the [facility] to function.  I will follow-up in the office in 1 week.  Strict nonweightbearing on the right.  Antibiotics per infectious disease.

## 2017-05-05 NOTE — Care Management Note (Signed)
Case Management Note  Patient Details  Name: Keith [name] MRN: [id number] Date of Birth: [birth year]  Subjective/Objective:  52 yr old gentleman s/p right foot transmetatarsal amputation.                   Action/Plan: Weekend Case manager has assessed patient for discharge plan. This CM called referral to Neoma Laming, [place] Liaison. Patient will get PICC prior to discharge for 2 weeks of IV antibiotics. He will have family support at discharge.    Expected Discharge Date:    pending              Expected Discharge Plan:  [place]  In-House Referral:  NA  Discharge planning Services  CM Consult  Post Acute Care Choice:  Home Health Choice offered to:  Patient  DME Arranged:  N/A DME Agency:  NA  HH Arranged:  RN, PT [place] Agency:  [place]  Status of Service:  In process, will continue to follow  If discussed at Long Length of Stay Meetings, dates discussed:    Additional Comments:  Ninfa Meeker, RN 05/05/2017, 10:08 AM

## 2017-05-05 NOTE — Progress Notes (Signed)
PROGRESS NOTE  Keith [name]  JGO:[id number] DOB: Feb 12, 1966 DOA: 04/28/2017 PCP: Scot Jun, FNP   Brief Narrative: Keith [name] is a 52 y.o. male with a history of HIV on ART (last CD4 210, undetectable VL), T2DM, HTN, stage III CKD, and chronic right foot osteomyelitis who presented to the ED 1/21 with worsening distal plantar pain and fever/chills concerning for worsening wound infection/osteomyelitis despite outpatient augmentin. He was tachycardic, afebrile with leukocytosis  (WBC 13.4) and XR demonstrating progressive demineralization in the first metatarsal head suggesting osteomyelitis. CRP found to be 14.9, ESR 140, lactic acid reassuringly normal. Broad IV abx started and [name] contacted for orthopedic consultation, who performed right TMA 05/02/2017. Blood cultures grew MRSA, ID consulted, TTE and TEE without signs of vegetation. Repeat blood cultures prior to the surgery were positive, so these are repeated 1/26.   Assessment & Plan: Active Problems:   Osteomyelitis of metatarsal (HCC)   Wound infection   Bacteremia  Sepsis due MRSA bacteremia and acute on chronic osteomyelitis of right first metatarsal with overlying cellulitis of right foot: s/p TMA 1/25 per [name]. MRI showed significant worsening of osteo, myositis, and abscess. ESR 140, CRP 14.9. Has been followed closely by [name]. TEE negative. - Continue vancomycin, plan to continue x2 weeks from negative culture (currently 1/26 NGTD).  - CM consulted as PT recommends HH-PT.   - Continue oxycodone as ordered, will decrease given sedation this AM, continue bowel regimen.   HIV on ART: CD4 count down to 80 (?transient due to infxn), VL undetectable. - Continue Triumeq, Prezcobix - Started dapsone for OI ppx.   IDT2DM:  - Continue lantus 20u qHS, SSI qAC/HS. CBGs well controlled. - Pt has very little insight into effects of diet. Consulted RD and diabetes coordinator  Stage III CKD: Cr appears stable to  baseline.  - Monitor BMP, UOP - Renally dose medications, caution with vancomycin  Hematuria: Uncertain etiology. Hgb near baseline.  - Defer further evaluation to outpatient setting.   Protein gap: Protein gap of 5.4 present is likely secondary to HIV infection.  - Can consider further workup for paraproteinemia in the outpatient setting.  DVT prophylaxis: Lovenox Code Status: Full Family Communication: None at bedside Disposition Plan: Home with midline IV 1/29 if cultures remain negative.  Consultants:   Orthopedic surgery, [name]  ID  Cardiology  Procedures:   Echo 1/23 Study Conclusions - Left ventricle: The cavity size was normal. Wall thickness was   normal. Systolic function was normal. The estimated ejection   fraction was in the range of 55% to 60%. Wall motion was normal;   there were no regional wall motion abnormalities. Doppler   parameters are consistent with abnormal left ventricular   relaxation (grade 1 diastolic dysfunction). - Mitral valve: There was trivial regurgitation.  Impressions: - There was no evidence of a vegetation.  Transesophageal echocardiogram 05/02/2017: The case was done in the OR just following the partial foot amputation by [name]  Left Ventrical:  Normal LV function  Mitral Valve: normal,  No vegetation   Aortic Valve: normal, no vegetation   Tricuspid Valve: normal , no vegetation  Pulmonic Valve: normal   Left Atrium/ Left atrial appendage: normal , no thrombi   Atrial septum:  No ASD or PFO by color flow   Aorta:  Normal   Complications: No apparent complications Patient did tolerate procedure well.  Antimicrobials:  Vancomycin 1/21 >>   Cefepime 1/21 - 1/22  Zosyn  1/22 >>    Subjective: Pain better, a bit groggy this morning. No fevers. Wants to go home.   Objective: BP (!) 142/81 (BP Location: Left Arm)   Pulse 90   Temp 98.3 F (36.8 C) (Oral)   Resp 16   Ht 5' 9.02" (1.753 m)    Wt 110.5 kg (243 lb 9.7 oz)   SpO2 98%   BMI 35.96 kg/m  Gen: 52 y.o. male in no distress Pulm: Non-labored breathing room air. Clear to auscultation bilaterally. CV: Regular rate and rhythm. No murmur, rub, or gallop. No JVD, no significant pedal edema. GI: Abdomen soft, non-tender, non-distended, with normoactive bowel sounds. No organomegaly or masses felt. Skin: Right foot TMA site with vac in place. +pulse. Sensation intact. Neuro: Alert and oriented. Diminished sensation bilateral distal LE's.  Psych: Judgement and insight appear intact.   Data Reviewed: I have personally reviewed following labs and imaging studies  CBC: Recent Labs  Lab 04/28/17 1444 04/29/17 0644 04/30/17 0923 05/01/17 0752 05/03/17 0545 05/05/17 0710  WBC 13.4* 11.9* 13.9* 9.6 10.3 10.8*  NEUTROABS 10.3* 9.1*  --   --   --   --   HGB 9.3* 8.9* 8.9* 9.2* 8.4* 8.1*  HCT 28.1* 28.0* 27.3* 28.1* 26.7* 26.1*  MCV 85.7 85.6 84.5 83.6 85.9 86.4  PLT 363 342 389 400 383 413*   Basic Metabolic Panel: Recent Labs  Lab 04/28/17 2347  04/30/17 0923 05/01/17 0752 05/03/17 0545 05/04/17 0615 05/05/17 0710  NA  --    < > 136 136 133* 135 137  K  --    < > 3.5 3.3* 3.9 4.0 3.8  CL  --    < > 100* 99* 97* 97* 101  CO2  --    < > _0 GLUCOSE  --    < > 95 99 125* 112* 89  BUN  --    < > _1 CREATININE  --    < > 1.56* 1.21 1.36* 1.48* 1.29*  CALCIUM  --    < > 9.1 9.0 8.7* 8.9 8.9  MG 1.7  --   --   --   --   --   --   PHOS 3.1  --   --   --   --   --   --    < > = values in this interval not displayed.    Recent Results (from the past 240 hour(s))  Culture, blood (routine x 2)     Status: Abnormal   Collection Time: 04/29/17 12:00 AM  Result Value Ref Range Status   Specimen Description BLOOD RIGHT ANTECUBITAL  Final   Special Requests IN PEDIATRIC BOTTLE Blood Culture adequate volume  Final   Culture  Setup Time   Final    GRAM POSITIVE COCCI IN CLUSTERS IN PEDIATRIC  BOTTLE CRITICAL RESULT CALLED TO, READ BACK BY AND VERIFIED WITH: J. CARNEY, RPHARMD AT 1810 ON 04/29/17 BY C. JESSUP, MLT.    Culture (A)  Final    STAPHYLOCOCCUS AUREUS SUSCEPTIBILITIES PERFORMED ON PREVIOUS CULTURE WITHIN THE LAST 5 DAYS.    Report Status 05/01/2017 FINAL  Final  Culture, blood (routine x 2)     Status: Abnormal   Collection Time: 04/29/17 12:08 AM  Result Value Ref Range Status   Specimen Description BLOOD RIGHT ANTECUBITAL  Final   Special Requests IN PEDIATRIC BOTTLE Blood Culture adequate volume  Final   Culture  Setup Time   Final    GRAM POSITIVE COCCI IN PEDIATRIC BOTTLE CRITICAL RESULT CALLED TO, READ BACK BY AND VERIFIED WITH: J. CARNEY, RPHARMD AT 1810 ON 04/29/17 BY C. JESSUP, MLT.    Culture METHICILLIN RESISTANT STAPHYLOCOCCUS AUREUS (A)  Final   Report Status 05/01/2017 FINAL  Final   Organism ID, Bacteria METHICILLIN RESISTANT STAPHYLOCOCCUS AUREUS  Final      Susceptibility   Methicillin resistant staphylococcus aureus - MIC*    CIPROFLOXACIN >=8 RESISTANT Resistant     ERYTHROMYCIN >=8 RESISTANT Resistant     GENTAMICIN <=0.5 SENSITIVE Sensitive     OXACILLIN >=4 RESISTANT Resistant     TETRACYCLINE <=1 SENSITIVE Sensitive     VANCOMYCIN 1 SENSITIVE Sensitive     TRIMETH/SULFA <=10 SENSITIVE Sensitive     CLINDAMYCIN <=0.25 SENSITIVE Sensitive     RIFAMPIN <=0.5 SENSITIVE Sensitive     Inducible Clindamycin NEGATIVE Sensitive     * METHICILLIN RESISTANT STAPHYLOCOCCUS AUREUS  Blood Culture ID Panel (Reflexed)     Status: Abnormal   Collection Time: 04/29/17 12:08 AM  Result Value Ref Range Status   Enterococcus species NOT DETECTED NOT DETECTED Final   Listeria monocytogenes NOT DETECTED NOT DETECTED Final   Staphylococcus species DETECTED (A) NOT DETECTED Final    Comment: CRITICAL RESULT CALLED TO, READ BACK BY AND VERIFIED WITH: J. CARNEY, RPHARMD AT 1810 ON 04/29/17 BY C. JESSUP, MLT.    Staphylococcus aureus DETECTED (A) NOT DETECTED  Final    Comment: Methicillin (oxacillin)-resistant Staphylococcus aureus (MRSA). MRSA is predictably resistant to beta-lactam antibiotics (except ceftaroline). Preferred therapy is vancomycin unless clinically contraindicated. Patient requires contact precautions if  hospitalized. CRITICAL RESULT CALLED TO, READ BACK BY AND VERIFIED WITH: J. CARNEY, RPHARMD AT 1810 ON 04/29/17 BY C. JESSUP, MLT.    Methicillin resistance DETECTED (A) NOT DETECTED Final    Comment: CRITICAL RESULT CALLED TO, READ BACK BY AND VERIFIED WITH: J. CARNEY, RPHARMD AT 1810 ON 04/29/17 BY C. JESSUP, MLT.    Streptococcus species NOT DETECTED NOT DETECTED Final   Streptococcus agalactiae NOT DETECTED NOT DETECTED Final   Streptococcus pneumoniae NOT DETECTED NOT DETECTED Final   Streptococcus pyogenes NOT DETECTED NOT DETECTED Final   Acinetobacter baumannii NOT DETECTED NOT DETECTED Final   Enterobacter cloacae complex NOT DETECTED NOT DETECTED Final   Escherichia coli NOT DETECTED NOT DETECTED Final   Klebsiella oxytoca NOT DETECTED NOT DETECTED Final   Klebsiella pneumoniae NOT DETECTED NOT DETECTED Final   Serratia marcescens NOT DETECTED NOT DETECTED Final   Carbapenem resistance NOT DETECTED NOT DETECTED Final   Haemophilus influenzae NOT DETECTED NOT DETECTED Final   Neisseria meningitidis NOT DETECTED NOT DETECTED Final   Pseudomonas aeruginosa NOT DETECTED NOT DETECTED Final   Candida albicans NOT DETECTED NOT DETECTED Final   Candida glabrata NOT DETECTED NOT DETECTED Final   Candida krusei NOT DETECTED NOT DETECTED Final   Candida parapsilosis NOT DETECTED NOT DETECTED Final   Candida tropicalis NOT DETECTED NOT DETECTED Final  MRSA PCR Screening     Status: Abnormal   Collection Time: 04/29/17  4:38 AM  Result Value Ref Range Status   MRSA by PCR POSITIVE (A) NEGATIVE Final    Comment:        The GeneXpert MRSA Assay (FDA approved for NASAL specimens only), is one component of a comprehensive  MRSA colonization surveillance program. It is not intended to diagnose MRSA infection nor to guide or monitor treatment for MRSA infections. RESULT CALLED  TO, READ BACK BY AND VERIFIED WITH: RN [place] [phone] MLM   Wound or Superficial Culture     Status: None   Collection Time: 04/29/17 12:10 PM  Result Value Ref Range Status   Specimen Description WOUND FOOT  Final   Special Requests NONE  Final   Gram Stain   Final    NO WBC SEEN RARE GRAM POSITIVE COCCI IN PAIRS IN SINGLES    Culture FEW METHICILLIN RESISTANT STAPHYLOCOCCUS AUREUS  Final   Report Status 05/01/2017 FINAL  Final   Organism ID, Bacteria METHICILLIN RESISTANT STAPHYLOCOCCUS AUREUS  Final      Susceptibility   Methicillin resistant staphylococcus aureus - MIC*    CIPROFLOXACIN >=8 RESISTANT Resistant     ERYTHROMYCIN >=8 RESISTANT Resistant     GENTAMICIN <=0.5 SENSITIVE Sensitive     OXACILLIN >=4 RESISTANT Resistant     TETRACYCLINE <=1 SENSITIVE Sensitive     VANCOMYCIN 1 SENSITIVE Sensitive     TRIMETH/SULFA <=10 SENSITIVE Sensitive     CLINDAMYCIN <=0.25 SENSITIVE Sensitive     RIFAMPIN <=0.5 SENSITIVE Sensitive     Inducible Clindamycin NEGATIVE Sensitive     * FEW METHICILLIN RESISTANT STAPHYLOCOCCUS AUREUS  Culture, blood (routine x 2)     Status: Abnormal   Collection Time: 04/30/17  9:20 AM  Result Value Ref Range Status   Specimen Description BLOOD RIGHT HAND  Final   Special Requests IN PEDIATRIC BOTTLE Blood Culture adequate volume  Final   Culture  Setup Time   Final    GRAM POSITIVE COCCI IN PEDIATRIC BOTTLE CRITICAL RESULT CALLED TO, READ BACK BY AND VERIFIED WITH: PARKEY RN AT 1141 ON [id number] BY SJW    Culture (A)  Final    STAPHYLOCOCCUS AUREUS SUSCEPTIBILITIES PERFORMED ON PREVIOUS CULTURE WITHIN THE LAST 5 DAYS.    Report Status 05/02/2017 FINAL  Final  Culture, blood (routine x 2)     Status: None (Preliminary result)   Collection Time: 04/30/17  9:27 AM  Result Value Ref  Range Status   Specimen Description BLOOD RIGHT HAND  Final   Special Requests IN PEDIATRIC BOTTLE Blood Culture adequate volume  Final   Culture NO GROWTH 4 DAYS  Final   Report Status PENDING  Incomplete  Culture, blood (routine x 2)     Status: None (Preliminary result)   Collection Time: 05/03/17  3:50 PM  Result Value Ref Range Status   Specimen Description BLOOD LEFT ANTECUBITAL  Final   Special Requests   Final    BOTTLES DRAWN AEROBIC ONLY Blood Culture adequate volume   Culture NO GROWTH < 24 HOURS  Final   Report Status PENDING  Incomplete  Culture, blood (routine x 2)     Status: None (Preliminary result)   Collection Time: 05/03/17  3:54 PM  Result Value Ref Range Status   Specimen Description BLOOD LEFT HAND  Final   Special Requests   Final    BOTTLES DRAWN AEROBIC ONLY Blood Culture adequate volume   Culture NO GROWTH < 24 HOURS  Final   Report Status PENDING  Incomplete      Radiology Studies: No results found.  Scheduled Meds: . abacavir-dolutegravir-lamiVUDine  1 tablet Oral QHS  . dapsone  100 mg Oral Daily  . darunavir-cobicistat  1 tablet Oral Q breakfast  . docusate sodium  100 mg Oral BID  . enoxaparin (LOVENOX) injection  55 mg Subcutaneous Daily  . insulin aspart  0-15 Units Subcutaneous TID WC  .  insulin aspart  0-5 Units Subcutaneous QHS  . insulin glargine  20 Units Subcutaneous Q2200  . polyethylene glycol  17 g Oral Daily  . senna  1 tablet Oral BID   Continuous Infusions: . sodium chloride 10 mL/hr at 05/02/17 2246  . lactated ringers    . methocarbamol (ROBAXIN)  IV    . vancomycin Stopped (05/05/17 0820)     LOS: 7 days   Time spent: 25 minutes.  Vance Gather, MD [facility] Pager [phone]  If 7PM-7AM, please contact night-coverage [email or web address] Password [email or web address] 05/05/2017, 1:56 PM

## 2017-05-05 NOTE — Progress Notes (Signed)
Pharmacy Antibiotic Note  Keith [name] is a 52 y.o. male admitted on 04/28/2017 with foot pain, found to have osteomyelitis and subsequent MRSA bacteremia.  ECHO normal and Ortho plans for amputation  vanc tr - 15 within goal  Plan: Continue vanc 750mg  IV Q12H Monitor renal fxn, clinical progress, repeat vanc trough prn   Height: 5' 9.02" (175.3 cm) Weight: 243 lb 9.7 oz (110.5 kg) IBW/kg (Calculated) : 70.74  Temp (24hrs), Avg:98.2 F (36.8 C), Min:98.1 F (36.7 C), Max:98.3 F (36.8 C)  Recent Labs  Lab 04/28/17 2027  04/29/17 0644 04/29/17 0726 04/30/17 0923 05/01/17 0752 05/01/17 1108 05/03/17 0545 05/04/17 0615 05/05/17 0710 05/05/17 1823  WBC  --   --  11.9*  --  13.9* 9.6  --  10.3  --  10.8*  --   CREATININE  --    < >  --   --  1.56* 1.21  --  1.36* 1.48* 1.29*  --   LATICACIDVEN 1.39  --   --  0.6  --   --   --   --   --   --   --   VANCOTROUGH  --   --   --   --   --   --  16  --   --   --  15   < > = values in this interval not displayed.    Estimated Creatinine Clearance: 82 mL/min (A) (by C-G formula based on SCr of 1.29 mg/dL (H)).    Allergies  Allergen Reactions  . Sulfa Antibiotics Itching   Levester Fresh, PharmD, BCPS, BCCCP Clinical Pharmacist Clinical phone for 05/05/2017 from [phone]: [phone] If after 2300, please call main pharmacy at: [phone] 05/05/2017 8:01 PM

## 2017-05-05 NOTE — Progress Notes (Signed)
    [facility] for Infectious Disease    Date of Admission:  04/28/2017     ID: Keith [name] is a 52 y.o. male with HTN, CKD III, DM, and HIV admitted with progressive chronic right first metatarsal osteomyelitis despite outpatient therapy with Augmentin. Now with MRSA bacteremia. Echocardiogram with minimal mitral regurg, no vegetations. TEE negative for vegetations. S/p transmetatarsal amputation on 1/25.  Antibiotics: 1/21 IV Vanc >>  1/21 IV Cefepime >> dc'd 1/22 1/22 IV Zosyn x 1 dose >> dc'd 1/22  Cultures: 1/22 Blood Cx >> MRSA 1/22 Wound Cx >> MRSA 1/23 Blood Cx >> MRSA 1/26 Blood Cx >> NG x 24 hours   Subjective: Doing well this morning, no complaints. Eager for discharge home.   Medications:  . abacavir-dolutegravir-lamiVUDine  1 tablet Oral QHS  . dapsone  100 mg Oral Daily  . darunavir-cobicistat  1 tablet Oral Q breakfast  . docusate sodium  100 mg Oral BID  . enoxaparin (LOVENOX) injection  55 mg Subcutaneous Daily  . insulin aspart  0-15 Units Subcutaneous TID WC  . insulin aspart  0-5 Units Subcutaneous QHS  . insulin glargine  20 Units Subcutaneous Q2200  . polyethylene glycol  17 g Oral Daily  . senna  1 tablet Oral BID    Objective: Vital signs in last 24 hours: Temp:  [98.3 F (36.8 C)] 98.3 F (36.8 C) (01/28 0659) Pulse Rate:  [90-92] 90 (01/28 0659) Resp:  [16] 16 (01/28 0659) BP: (142)/(81-89) 142/81 (01/28 0659) SpO2:  [98 %-99 %] 98 % (01/28 0659)  Constitutional: NAD, appears comfortable HEENT: Atraumatic, normocephalic. PERRL, anicteric sclera.  Neck: Supple, trachea midline.  Cardiovascular:RRR, no murmurs, rubs, or gallops.  Pulmonary/Chest:CTAB, no wheezes, rales, or rhonchi.  Abdominal:Soft, non tender, non distended. +BS.  Extremities: Warm and well perfused.Right foot s/p transmetatarsal amputation, wound vac in place Neurological:A&Ox3, CN II - XII grossly intact.  Skin: No rashes or erythema  Psychiatric:Normal mood  and affect  Lab Results Recent Labs    05/03/17 0545 05/04/17 0615 05/05/17 0710  WBC 10.3  --  10.8*  HGB 8.4*  --  8.1*  HCT 26.7*  --  26.1*  NA 133* 135 137  K 3.9 4.0 3.8  CL 97* 97* 101  CO2 25 25 26   BUN 7 9 9   CREATININE 1.36* 1.48* 1.29*    Assessment/Plan: Continue IV vancomycin. If blood cultures remain negative tomorrow (72 hours) can place PICC line. Will need 2 weeks total of IV abx from surgery date. [facility] for Infectious Diseases Cell: [phone] Pager: [phone]  05/05/2017, 9:24 AM

## 2017-05-06 DIAGNOSIS — Z794 Long term (current) use of insulin: Secondary | ICD-10-CM

## 2017-05-06 DIAGNOSIS — R509 Fever, unspecified: Secondary | ICD-10-CM

## 2017-05-06 DIAGNOSIS — E119 Type 2 diabetes mellitus without complications: Secondary | ICD-10-CM

## 2017-05-06 LAB — BASIC METABOLIC PANEL
Anion gap: 10 (ref 5–15)
BUN: 9 mg/dL (ref 6–20)
CO2: 25 mmol/L (ref 22–32)
Calcium: 8.6 mg/dL — ABNORMAL LOW (ref 8.9–10.3)
Chloride: 95 mmol/L — ABNORMAL LOW (ref 101–111)
Creatinine, Ser: 1.46 mg/dL — ABNORMAL HIGH (ref 0.61–1.24)
GFR calc Af Amer: >60 mL/min (ref >60)
GFR calc non Af Amer: 54 mL/min — ABNORMAL LOW (ref >60)
Glucose, Bld: 317 mg/dL — ABNORMAL HIGH (ref 65–99)
Potassium: 3.9 mmol/L (ref 3.5–5.1)
Sodium: 130 mmol/L — ABNORMAL LOW (ref 135–145)

## 2017-05-06 LAB — GLUCOSE, CAPILLARY
Glucose-Capillary: 263 mg/dL — ABNORMAL HIGH (ref 65–99)
Glucose-Capillary: 86 mg/dL (ref 65–99)
Glucose-Capillary: 115 mg/dL — ABNORMAL HIGH (ref 65–99)
Glucose-Capillary: 113 mg/dL — ABNORMAL HIGH (ref 65–99)

## 2017-05-06 MED ORDER — DAPSONE 100 MG PO TABS: 100.0000 mg | ORAL_TABLET | Freq: Every day | ORAL | 0 refills | Status: DC

## 2017-05-06 MED ORDER — VANCOMYCIN IV (FOR PTA / DISCHARGE USE ONLY): 750.0000 mg | Freq: Two times a day (BID) | INTRAVENOUS | 0 refills | Status: AC

## 2017-05-06 MED ORDER — SODIUM CHLORIDE 0.9% FLUSH
10.0000 mL | Freq: Two times a day (BID) | INTRAVENOUS | Status: DC
  Administered 2017-05-06: 10 mL

## 2017-05-06 MED ORDER — SODIUM CHLORIDE 0.9% FLUSH
10.0000 mL | INTRAVENOUS | Status: DC | PRN
Start: 2017-05-06 — End: 2017-05-07

## 2017-05-06 MED ORDER — POLYETHYLENE GLYCOL 3350 17 G PO PACK: 17.0000 g | PACK | Freq: Every day | ORAL | 0 refills | Status: DC

## 2017-05-06 NOTE — Discharge Summary (Signed)
Physician Discharge Summary  Keith [name] NVB:[id number] DOB: 1965-08-27 DOA: 04/28/2017  PCP: Scot Jun, FNP  Admit date: 04/28/2017 Discharge date: 05/06/2017  Admitted From: Home Disposition: Home   Recommendations for Outpatient Follow-up:  1. Follow up with PCP in 1-2 weeks 2. Please obtain BMP/CBC in one week 3. Follow up with orthopedics, [name], in 1 week; strict NWB on the RLE.   Home Health: PT Equipment/Devices: Wheelchair Discharge Condition: Stable CODE STATUS: Full Diet recommendation: Heart healthy, carb-modified.  Brief/Interim Summary: Keith [name] is a 52 y.o. male with a history of HIV on ART (last CD4 210, undetectable VL), T2DM, HTN, stage III CKD, and chronic right foot osteomyelitis who presented to the ED 1/21 with worsening distal plantar pain and fever/chills concerning for worsening wound infection/osteomyelitis despite outpatient augmentin. He was tachycardic, afebrile with leukocytosis  (WBC 13.4) and XR demonstrating progressive demineralization in the first metatarsal head suggesting osteomyelitis. CRP found to be 14.9, ESR 140, lactic acid reassuringly normal. Broad IV abx started and [name] contacted for orthopedic consultation, who performed right TMA 05/02/2017. Blood cultures grew MRSA, ID consulted, TTE and TEE without signs of vegetation. Repeat blood cultures prior to the surgery were positive, so these were repeated 1/26 and have remained negative for 72 hours. PICC line was placed 1/29 and the patient will continue vancomycin as below through 2/7.   Discharge Diagnoses:  Active Problems:   Osteomyelitis of metatarsal (HCC)   Wound infection   Bacteremia  Sepsis due MRSA bacteremia and acute on chronic osteomyelitis of right first metatarsal with overlying cellulitis of right foot: s/p TMA 1/25 per [name]. MRI showed significant worsening of osteo, myositis, and abscess. ESR 140, CRP 14.9. TEE negative. - Continue vancomycin, plan  to continue x2 weeks from negative culture (currently 1/26 NGTD). Had PICC placed 1/29 (midline contraindicated due to use of vancomycin more than 6 days and CKD not stage III) - CM arranged DME, HH-RN, and HH-PT.   - Verified Percocet 10-332m tabs #60/20 days filled 04/16/2017 at [facility] 163mtabs #90/30 days filled 04/22/17 [facility]. Therefore, no new prescriptions for pain medications were prescribed.   HIV on ART: CD4 count down to 80 (?transient due to infxn), VL undetectable. - Continue Triumeq, Prezcobix - Started dapsone for OI ppx. Plan to follow up with ID in the next 4 weeks.   IDT2DM:  - Continue home medications.  - Pt has very little insight into effects of diet. - Consulted RD and diabetes coordinator.  Stage II CKD: Cr appears stable to baseline. GFR is consistently >4558min.  - Monitor BMP  Microscopic hematuria: Uncertain etiology. Hgb near baseline.  - Recommend repeat urinalysis. Defer further evaluation to outpatient setting.   Protein gap: Protein gap of 5.4 present is likely secondary to HIV infection.  - Can consider further workup for paraproteinemia in the outpatient setting.   Discharge Instructions Discharge Instructions    Diet - low sodium heart healthy   Complete by:  As directed    Diet Carb Modified   Complete by:  As directed    Discharge instructions   Complete by:  As directed    you were admitted for a nonhealing wound that was treated with amputation. You also had a bloodstream infection that will require ongoing antibiotics. You will receive home health RN and PT services in addition to DME, and continue vancomycin through the PICC line through 05/15/2017.  - Start also taking dapsone as  directed until you follow up with your infectious disease doctor within the next 4 weeks - Review of records indicate that you've filled a prescription for 90 tablets of oxycodone on 1/15, which you should continue taking as directed  until you follow up with [name] in the next week for wound recheck. While taking this medication, you need to take miralax once-to-twice daily to avoid constipation. - Do not bear weight on the right foot - If you develop fever or are unable to take the antibiotic for any reason, seek medical attention right away.   Home infusion instructions [facility] May follow [place] Dosing Protocol; May administer Cathflo as needed to maintain patency of vascular access device.; Flushing of vascular access device: per [facility] Protocol: 0.9% NaCl pre/post medica...   Complete by:  As directed    Instructions:  May follow [place] Dosing Protocol   Instructions:  May administer Cathflo as needed to maintain patency of vascular access device.   Instructions:  Flushing of vascular access device: per [facility] Protocol: 0.9% NaCl pre/post medication administration and prn patency; Heparin 100 u/ml, 71m for implanted ports and Heparin 10u/ml, 581mfor all other central venous catheters.   Instructions:  May follow [facility] Anaphylaxis Protocol for First Dose Administration in the home: 0.9% NaCl at 25-50 ml/hr to maintain IV access for protocol meds. Epinephrine 0.3 ml IV/IM PRN and Benadryl 25-50 IV/IM PRN s/s of anaphylaxis.   Instructions:  [facility] (RN) to assist per patient IV care needs in the home PRN.   Increase activity slowly   Complete by:  As directed    Leave dressing on - Keep it clean, dry, and intact until clinic visit   Complete by:  As directed      Allergies as of 05/06/2017      Reactions   Sulfa Antibiotics Itching      Medication List    STOP taking these medications   amoxicillin-clavulanate 875-125 MG tablet Commonly known as:  AUGMENTIN     TAKE these medications   abacavir-dolutegravir-lamiVUDine 600-50-300 MG tablet Commonly known as:  TRIUMEQ Take 1 tablet by mouth daily. What changed:  when to take this   amLODipine 10 MG tablet Commonly  known as:  NORVASC Take 1 tablet (10 mg total) by mouth daily. What changed:  when to take this   cyclobenzaprine 10 MG tablet Commonly known as:  FLEXERIL Take 1 tablet (10 mg total) by mouth 2 (two) times daily as needed for muscle spasms.   dapsone 100 MG tablet Take 1 tablet (100 mg total) by mouth daily. Start taking on:  05/07/2017   darunavir-cobicistat 800-150 MG tablet Commonly known as:  PREZCOBIX Take 1 tablet by mouth daily. Swallow whole. Do NOT crush, break or chew tablets. Take with food.   glimepiride 1 MG tablet Commonly known as:  AMARYL Take 1 tablet (1 mg total) by mouth every morning. What changed:  when to take this   Insulin Glargine 100 UNIT/ML Solostar Pen Commonly known as:  LANTUS SOLOSTAR Inject 20 Units into the skin daily at 10 pm.   Insulin Syringes (Disposable) U-100 0.3 ML Misc For administration of insulin TID. ICD E-11.9   lisinopril 5 MG tablet Commonly known as:  PRINIVIL,ZESTRIL Take 5 mg by mouth daily.   LORazepam 1 MG tablet Commonly known as:  ATIVAN Take 1 tablet (1 mg total) by mouth every 6 (six) hours as needed for anxiety. What changed:  Another  medication with the same name was removed. Continue taking this medication, and follow the directions you see here.   NARCAN 4 MG/0.1ML Liqd nasal spray kit Generic drug:  naloxone Place 1 spray into the nose daily as needed (opioid overdose).   Oxycodone HCl 10 MG Tabs Take 10 mg by mouth 3 (three) times daily.   oxyCODONE-acetaminophen 10-325 MG tablet Commonly known as:  PERCOCET Take 1 tablet by mouth every 8 (eight) hours as needed (breakthrough pain). What changed:  Another medication with the same name was removed. Continue taking this medication, and follow the directions you see here.   polyethylene glycol packet Commonly known as:  MIRALAX / GLYCOLAX Take 17 g by mouth daily. Start taking on:  05/07/2017   silver sulfADIAZINE 1 % cream Commonly known as:   SILVADENE Apply 1 application topically daily.   vancomycin IVPB Inject 750 mg into the vein every 12 (twelve) hours for 9 days. Indication:  MRSA bacteremia Last Day of Therapy:  05/15/2017 Labs - _0 /29/19 1230       Durable Medical Equipment  (From admission, onward)        Start     Ordered   05/06/17 1123  For home use only DME lightweight manual wheelchair with seat cushion  Once    Comments:  Patient suffers from rightTransmetatarsal amputation which impairs their ability to perform daily activities like ambulating in the home.  A cane will not resolve  issue with performing activities of daily living. A wheelchair will allow patient to  safely perform daily activities. Patient is not able to propel themselves in the home using a standard weight wheelchair due to genralized weakness. Patient can self propel in the lightweight wheelchair.  Accessories: elevating leg rests (ELRs), wheel locks, extensions and anti-tippers.   05/06/17 1123     Follow-up Information    [name], MD Follow up in 1 week(s).   Specialty:  Orthopedic Surgery Contact information: [place] [address] [phone]        [facility] Follow up.   Specialty:  Home Health Services Why:  A representative from [facility] will contact  you to arrange start date and time for your therapy and Nurse . Contact information: [place] [address] [phone]        Scot Jun, FNP Follow up.   Specialty:  Family Medicine Contact information: [place] [address] [phone]          Allergies  Allergen Reactions  . Sulfa Antibiotics Itching    Consultations:  Orthopedics  Infectious disease  Procedures/Studies: Mr Foot Right W Wo Contrast  Result Date: 04/30/2017 CLINICAL DATA:  Diabetic patient with chronic skin ulcerations on the foot. Question osteomyelitis. EXAM: MRI OF THE RIGHT FOREFOOT WITHOUT AND WITH CONTRAST TECHNIQUE: Multiplanar, multisequence MR imaging of the right forefoot was performed before and after the administration of intravenous contrast. CONTRAST:  11m MULTIHANCE GADOBENATE DIMEGLUMINE 529 MG/ML IV SOLN COMPARISON:  Plain films right foot 03/14/2017 and 04/28/2017. MRI right foot 03/15/2017. FINDINGS: Bones/Joint/Cartilage Since the prior examination, the patient developed marrow edema and enhancement throughout the first metatarsal and great toe. Edema and enhancement are most intense about the first MTP joint but the appearance is most consistent with osteomyelitis throughout. Small first MTP joint effusion is  noted. The patient also has new edema and enhancement in almost the entire second metatarsal, most intense in the head and neck of the metatarsal. Edema and enhancement are also seen throughout the proximal phalanx of the second toe. New intense edema and enhancement are seen in the proximal 2.5 cm of the fifth metatarsal. Low level edema and enhancement are seen in the third, fourth and fifth toes and head and necks of the third, fourth and fifth metatarsals. Ligaments Intact. Muscles and Tendons Edema and enhancement are seen in the intrinsic musculature of the foot. No intramuscular abscess is identified. Soft tissues A large skin wound is seen on the plantar surface of the foot between the first and second metatarsal heads. There also skin wound at the base of the fifth metatarsal. A subcutaneous fluid collection superficial to the second MTP joint measures 0.8 cm long by 0.7 cm craniocaudal by 0.5 cm transverse and is consistent with an abscess. IMPRESSION: Markedly worsened appearance of the foot since the patient's prior MRI 03/15/2017. Findings consistent with osteomyelitis throughout almost the entire first and second rays and proximal 2.5 cm of the fifth metatarsal. Mild edema and enhancement in the third, fourth and fifth toes and distal third, fourth and fifth metatarsals could be reactive rather than due to osteomyelitis. Signal change and enhancement are worst in the distal third metatarsal and third toe. Large skin wound on the plantar surface of the foot between the first and second metatarsal heads. Second skin ulceration at the base of the fifth metatarsal is identified. Edema and enhancement in intrinsic musculature the foot compatible with myositis. Small fluid collection just superior to the second MTP joint consistent with abscess. Electronically Signed   By: TInge RiseM.D.   On: 04/30/2017 12:55   Dg Foot Complete Right  Result Date: 04/28/2017 CLINICAL DATA:  C/o pain to right  foot. Best obtainable images due to patients pain. Hx diabetes, htn EXAM: RIGHT FOOT COMPLETE - 3+ VIEW COMPARISON:  03/14/2017 FINDINGS: Progressive patchy demineralization in the head of the first metatarsal. Otherwise normal mineralization. Great toe sesamoids are no longer visible possibly surgically resected. There is an associated plantar skin defect. Stap if le projects over the base of the fifth metatarsal. Normal alignment. IMPRESSION: 1. Progressive demineralization in the first metatarsal head suggesting  osteomyelitis. Electronically Signed   By: Lucrezia Europe M.D.   On: 04/28/2017 19:10    Echo 1/23 Study Conclusions - Left ventricle: The cavity size was normal. Wall thickness was normal. Systolic function was normal. The estimated ejection fraction was in the range of 55% to 60%. Wall motion was normal; there were no regional wall motion abnormalities. Doppler parameters are consistent with abnormal left ventricular relaxation (grade 1 diastolic dysfunction). - Mitral valve: There was trivial regurgitation.  Impressions: - There was no evidence of a vegetation.  Transesophageal echocardiogram 05/02/2017: The case was done in the OR just following the partial foot amputation by [name]  Left Ventrical:Normal LV function  Mitral Valve:normal, No vegetation   Aortic Valve:normal, no vegetation  Tricuspid Valve:normal , no vegetation  Pulmonic Valve:normal  Left Atrium/ Left atrial appendage:normal , no thrombi  Atrial septum:No ASD or PFO by color flow  Aorta:Normal  Complications:No apparent complications Patientdidtolerate procedure well.  AMPUTATION RIGHT TRANSMETARSAL 05/02/2017 by [name]  Subjective: Pain is controlled, eager to get out of hospital. Worked with PT, feels confident about going home. No fever, chills, dyspnea, chest pain.   Discharge Exam: Vitals:   05/05/17 2105 05/06/17 0655  BP: (!) 158/87 (!) 143/76   Pulse: 81 87  Resp: 16 16  Temp: 97.9 F (36.6 C) 98.6 F (37 C)  SpO2: 98% 98%   General: Pt is alert, awake, not in acute distress Cardiovascular: RRR, S1/S2 +, no rubs, no gallops Respiratory: CTA bilaterally, no wheezing, no rhonchi Abdominal: Soft, NT, ND, bowel sounds + Skin: Right foot TMA site with vac in place. +pulse. Sensation intact. Neuro: Alert and oriented. Diminished sensation bilateral distal LE's.   Labs: Basic Metabolic Panel: Recent Labs  Lab 05/01/17 0752 05/03/17 0545 05/04/17 0615 05/05/17 0710 05/06/17 0631  NA 136 133* 135 137 130*  K 3.3* 3.9 4.0 3.8 3.9  CL 99* 97* 97* 101 95*  CO2 _0 GLUCOSE 99 125* 112* 89 317*  BUN _1 CREATININE 1.21 1.36* 1.48* 1.29* 1.46*  CALCIUM 9.0 8.7* 8.9 8.9 8.6*   CBC: Recent Labs  Lab 04/30/17 0923 05/01/17 0752 05/03/17 0545 05/05/17 0710  WBC 13.9* 9.6 10.3 10.8*  HGB 8.9* 9.2* 8.4* 8.1*  HCT 27.3* 28.1* 26.7* 26.1*  MCV 84.5 83.6 85.9 86.4  PLT 389 400 383 449*   CBG: Recent Labs  Lab 05/05/17 1159 05/05/17 1629 05/05/17 2104 05/06/17 0700 05/06/17 1134  GLUCAP 115* 172* 106* 263* 86   Urinalysis    Component Value Date/Time   COLORURINE YELLOW 04/29/2017 0438   APPEARANCEUR HAZY (A) 04/29/2017 0438   LABSPEC 1.018 04/29/2017 0438   PHURINE 5.0 04/29/2017 0438   GLUCOSEU NEGATIVE 04/29/2017 0438   HGBUR SMALL (A) 04/29/2017 0438   BILIRUBINUR NEGATIVE 04/29/2017 0438   KETONESUR NEGATIVE 04/29/2017 0438   PROTEINUR 100 (A) 04/29/2017 0438   UROBILINOGEN 1.0 01/01/2017 1032   NITRITE NEGATIVE 04/29/2017 0438   LEUKOCYTESUR NEGATIVE 04/29/2017 0438    Microbiology Recent Results (from the past 240 hour(s))  Culture, blood (routine x 2)     Status: Abnormal   Collection Time: 04/29/17 12:00 AM  Result Value Ref Range Status   Specimen Description BLOOD RIGHT ANTECUBITAL  Final   Special Requests IN PEDIATRIC BOTTLE Blood Culture adequate volume  Final    Culture  Setup Time   Final    GRAM POSITIVE COCCI IN CLUSTERS IN PEDIATRIC BOTTLE CRITICAL RESULT  CALLED TO, READ BACK BY AND VERIFIED WITH: J. CARNEY, RPHARMD AT 1810 ON 04/29/17 BY C. JESSUP, MLT.    Culture (A)  Final    STAPHYLOCOCCUS AUREUS SUSCEPTIBILITIES PERFORMED ON PREVIOUS CULTURE WITHIN THE LAST 5 DAYS.    Report Status 05/01/2017 FINAL  Final  Culture, blood (routine x 2)     Status: Abnormal   Collection Time: 04/29/17 12:08 AM  Result Value Ref Range Status   Specimen Description BLOOD RIGHT ANTECUBITAL  Final   Special Requests IN PEDIATRIC BOTTLE Blood Culture adequate volume  Final   Culture  Setup Time   Final    GRAM POSITIVE COCCI IN PEDIATRIC BOTTLE CRITICAL RESULT CALLED TO, READ BACK BY AND VERIFIED WITH: J. CARNEY, RPHARMD AT 1810 ON 04/29/17 BY C. JESSUP, MLT.    Culture METHICILLIN RESISTANT STAPHYLOCOCCUS AUREUS (A)  Final   Report Status 05/01/2017 FINAL  Final   Organism ID, Bacteria METHICILLIN RESISTANT STAPHYLOCOCCUS AUREUS  Final      Susceptibility   Methicillin resistant staphylococcus aureus - MIC*    CIPROFLOXACIN >=8 RESISTANT Resistant     ERYTHROMYCIN >=8 RESISTANT Resistant     GENTAMICIN <=0.5 SENSITIVE Sensitive     OXACILLIN >=4 RESISTANT Resistant     TETRACYCLINE <=1 SENSITIVE Sensitive     VANCOMYCIN 1 SENSITIVE Sensitive     TRIMETH/SULFA <=10 SENSITIVE Sensitive     CLINDAMYCIN <=0.25 SENSITIVE Sensitive     RIFAMPIN <=0.5 SENSITIVE Sensitive     Inducible Clindamycin NEGATIVE Sensitive     * METHICILLIN RESISTANT STAPHYLOCOCCUS AUREUS  Blood Culture ID Panel (Reflexed)     Status: Abnormal   Collection Time: 04/29/17 12:08 AM  Result Value Ref Range Status   Enterococcus species NOT DETECTED NOT DETECTED Final   Listeria monocytogenes NOT DETECTED NOT DETECTED Final   Staphylococcus species DETECTED (A) NOT DETECTED Final    Comment: CRITICAL RESULT CALLED TO, READ BACK BY AND VERIFIED WITH: J. CARNEY, RPHARMD AT 1810 ON  04/29/17 BY C. JESSUP, MLT.    Staphylococcus aureus DETECTED (A) NOT DETECTED Final    Comment: Methicillin (oxacillin)-resistant Staphylococcus aureus (MRSA). MRSA is predictably resistant to beta-lactam antibiotics (except ceftaroline). Preferred therapy is vancomycin unless clinically contraindicated. Patient requires contact precautions if  hospitalized. CRITICAL RESULT CALLED TO, READ BACK BY AND VERIFIED WITH: J. CARNEY, RPHARMD AT 1810 ON 04/29/17 BY C. JESSUP, MLT.    Methicillin resistance DETECTED (A) NOT DETECTED Final    Comment: CRITICAL RESULT CALLED TO, READ BACK BY AND VERIFIED WITH: J. CARNEY, RPHARMD AT 1810 ON 04/29/17 BY C. JESSUP, MLT.    Streptococcus species NOT DETECTED NOT DETECTED Final   Streptococcus agalactiae NOT DETECTED NOT DETECTED Final   Streptococcus pneumoniae NOT DETECTED NOT DETECTED Final   Streptococcus pyogenes NOT DETECTED NOT DETECTED Final   Acinetobacter baumannii NOT DETECTED NOT DETECTED Final   Enterobacter cloacae complex NOT DETECTED NOT DETECTED Final   Escherichia coli NOT DETECTED NOT DETECTED Final   Klebsiella oxytoca NOT DETECTED NOT DETECTED Final   Klebsiella pneumoniae NOT DETECTED NOT DETECTED Final   Serratia marcescens NOT DETECTED NOT DETECTED Final   Carbapenem resistance NOT DETECTED NOT DETECTED Final   Haemophilus influenzae NOT DETECTED NOT DETECTED Final   Neisseria meningitidis NOT DETECTED NOT DETECTED Final   Pseudomonas aeruginosa NOT DETECTED NOT DETECTED Final   Candida albicans NOT DETECTED NOT DETECTED Final   Candida glabrata NOT DETECTED NOT DETECTED Final   Candida krusei NOT DETECTED NOT DETECTED Final  Candida parapsilosis NOT DETECTED NOT DETECTED Final   Candida tropicalis NOT DETECTED NOT DETECTED Final  MRSA PCR Screening     Status: Abnormal   Collection Time: 04/29/17  4:38 AM  Result Value Ref Range Status   MRSA by PCR POSITIVE (A) NEGATIVE Final    Comment:        The GeneXpert MRSA Assay  (FDA approved for NASAL specimens only), is one component of a comprehensive MRSA colonization surveillance program. It is not intended to diagnose MRSA infection nor to guide or monitor treatment for MRSA infections. RESULT CALLED TO, READ BACK BY AND VERIFIED WITH: RN R Arlester Marker [id number] MLM   Wound or Superficial Culture     Status: None   Collection Time: 04/29/17 12:10 PM  Result Value Ref Range Status   Specimen Description WOUND FOOT  Final   Special Requests NONE  Final   Gram Stain   Final    NO WBC SEEN RARE GRAM POSITIVE COCCI IN PAIRS IN SINGLES    Culture FEW METHICILLIN RESISTANT STAPHYLOCOCCUS AUREUS  Final   Report Status 05/01/2017 FINAL  Final   Organism ID, Bacteria METHICILLIN RESISTANT STAPHYLOCOCCUS AUREUS  Final      Susceptibility   Methicillin resistant staphylococcus aureus - MIC*    CIPROFLOXACIN >=8 RESISTANT Resistant     ERYTHROMYCIN >=8 RESISTANT Resistant     GENTAMICIN <=0.5 SENSITIVE Sensitive     OXACILLIN >=4 RESISTANT Resistant     TETRACYCLINE <=1 SENSITIVE Sensitive     VANCOMYCIN 1 SENSITIVE Sensitive     TRIMETH/SULFA <=10 SENSITIVE Sensitive     CLINDAMYCIN <=0.25 SENSITIVE Sensitive     RIFAMPIN <=0.5 SENSITIVE Sensitive     Inducible Clindamycin NEGATIVE Sensitive     * FEW METHICILLIN RESISTANT STAPHYLOCOCCUS AUREUS  Culture, blood (routine x 2)     Status: Abnormal   Collection Time: 04/30/17  9:20 AM  Result Value Ref Range Status   Specimen Description BLOOD RIGHT HAND  Final   Special Requests IN PEDIATRIC BOTTLE Blood Culture adequate volume  Final   Culture  Setup Time   Final    GRAM POSITIVE COCCI IN PEDIATRIC BOTTLE CRITICAL RESULT CALLED TO, READ BACK BY AND VERIFIED WITH: PARKEY RN AT 1141 ON [id number] BY SJW    Culture (A)  Final    STAPHYLOCOCCUS AUREUS SUSCEPTIBILITIES PERFORMED ON PREVIOUS CULTURE WITHIN THE LAST 5 DAYS.    Report Status 05/02/2017 FINAL  Final  Culture, blood (routine x 2)     Status: None    Collection Time: 04/30/17  9:27 AM  Result Value Ref Range Status   Specimen Description BLOOD RIGHT HAND  Final   Special Requests IN PEDIATRIC BOTTLE Blood Culture adequate volume  Final   Culture NO GROWTH 5 DAYS  Final   Report Status 05/05/2017 FINAL  Final  Culture, blood (routine x 2)     Status: None (Preliminary result)   Collection Time: 05/03/17  3:50 PM  Result Value Ref Range Status   Specimen Description BLOOD LEFT ANTECUBITAL  Final   Special Requests   Final    BOTTLES DRAWN AEROBIC ONLY Blood Culture adequate volume   Culture NO GROWTH 3 DAYS  Final   Report Status PENDING  Incomplete  Culture, blood (routine x 2)     Status: None (Preliminary result)   Collection Time: 05/03/17  3:54 PM  Result Value Ref Range Status   Specimen Description BLOOD LEFT HAND  Final   Special Requests  Final    BOTTLES DRAWN AEROBIC ONLY Blood Culture adequate volume   Culture NO GROWTH 3 DAYS  Final   Report Status PENDING  Incomplete    Time coordinating discharge: Approximately 40 minutes  Vance Gather, MD  [facility] 05/06/2017, 12:34 PM Pager [phone]

## 2017-05-06 NOTE — Progress Notes (Signed)
Physical Therapy Treatment Patient Details Name: Keith [name] MRN: [id number] DOB: Oct 03, 1965 Today's Date: 05/06/2017    History of Present Illness Ptis a 52 y.o.malewith medical history significant ofHIV onART(last CD4 210 with an undetectable viral load),type 2 diabetes mellitus, hypertension, CKDstage III and osteomyelitis of the right first metatarsal on chronic therapy with Augmentin whopresented to the ED with worsening distal plantar wound infection with underlying osteomyelitis.  Underwent transmetatarsal amputation on 05/02/17.      PT Comments    Continuing work on functional mobility and activity tolerance;  Ronalee Belts was able to hop up one step forwards into RW with assist to steady RW; Maintains NWB for short distances with RW; worth considering wheelchair for longer household distances due to strict NWB status   Follow Up Recommendations  Home health PT     Equipment Recommendations  Rolling walker with 5" wheels;Wheelchair (measurements PT)    Recommendations for Other Services       Precautions / Restrictions Precautions Precautions: Fall Restrictions Weight Bearing Restrictions: No RLE Weight Bearing: Non weight bearing Other Position/Activity Restrictions: strict NWB per [name]    Mobility  Bed Mobility Overal bed mobility: Modified Independent             General bed mobility comments: performed sit to supine independently; laid himself down rather quickly; did not have the chance to get gait belt off despite cues  Transfers Overall transfer level: Needs assistance Equipment used: Rolling walker (2 wheeled) Transfers: Sit to/from Stand Sit to Stand: Min guard         General transfer comment: Cues for hand placement and safety; good maintenance of NWB  Ambulation/Gait Ambulation/Gait assistance: Min guard Ambulation Distance (Feet): 15 Feet Assistive device: Rolling walker (2 wheeled) Gait Pattern/deviations: (hop-to)     General  Gait Details: Cues to support self/body weight on RW for smoother stepping; abel to maintain NWB with hop-steps with RW for short distance; Adjusted height of RW lower for optimal fit   Stairs Stairs: Yes   Stair Management: No rails;With walker;Forwards Number of Stairs: 1(x2) General stair comments: Demonstrated technqiue for going up one step backwards, with the rationale that RW gives more support for getting LLE up the step going backwards; chose not to try going up backwards, but was ultimately able to hip up one step forwards with RW with min assist to steady RW  Wheelchair Mobility    Modified Rankin (Stroke Patients Only)       Balance     Sitting balance-Leahy Scale: Good       Standing balance-Leahy Scale: Poor                              Cognition Arousal/Alertness: Awake/alert Behavior During Therapy: WFL for tasks assessed/performed(slightly restless) Overall Cognitive Status: Within Functional Limits for tasks assessed                                 General Comments: poor safety awareness; becomes agitated when PT tries to use gait belt and gaurd patient for general safety      Exercises      General Comments        Pertinent Vitals/Pain Pain Assessment: No/denies pain    Home Living                      Prior Function  PT Goals (current goals can now be found in the care plan section) Acute Rehab PT Goals Patient Stated Goal: go home PT Goal Formulation: With patient Time For Goal Achievement: 05/10/17 Potential to Achieve Goals: Good Progress towards PT goals: Progressing toward goals    Frequency    Min 5X/week      PT Plan Current plan remains appropriate    Co-evaluation              AM-PAC PT "6 Clicks" Daily Activity  Outcome Measure  Difficulty turning over in bed (including adjusting bedclothes, sheets and blankets)?: None Difficulty moving from lying on back to  sitting on the side of the bed? : A Little Difficulty sitting down on and standing up from a chair with arms (e.g., wheelchair, bedside commode, etc,.)?: A Little Help needed moving to and from a bed to chair (including a wheelchair)?: A Little Help needed walking in hospital room?: A Little Help needed climbing 3-5 steps with a railing? : A Lot 6 Click Score: 18    End of Session Equipment Utilized During Treatment: Gait belt Activity Tolerance: Patient tolerated treatment well Patient left: in bed;with call bell/phone within reach Nurse Communication: Mobility status PT Visit Diagnosis: Unsteadiness on feet (R26.81);Other abnormalities of gait and mobility (R26.89)     Time: 1610-9604 PT Time Calculation (min) (ACUTE ONLY): 26 min  Charges:  $Gait Training: 8-22 mins $Therapeutic Activity: 8-22 mins                    G Codes:       Roney Marion, PT  Acute Rehabilitation Services Pager [phone] Office [place] 05/06/2017, 11:11 AM

## 2017-05-06 NOTE — Progress Notes (Signed)
Orthopedic Tech Progress Note Patient Details:  Keith [name] 11-04-1965 [id number]  Ortho Devices Type of Ortho Device: Darco shoe Ortho Device/Splint Location: Switched post op shoe for a [employer].  standard post op shoe did not totally cover foot.  However the Darco shoe properly covered entired foot.  pt was satisfied.  Ortho Device/Splint Interventions: Application, Adjustment   Post Interventions Patient Tolerated: Well Instructions Provided: Care of device, Adjustment of device   Kristopher Oppenheim 05/06/2017, 9:51 PM

## 2017-05-06 NOTE — Clinical Social Work Note (Signed)
CSW received a call from patient's nurse regarding transport home for patient. Per nurse, Mr. Mi brother is at work and cannot come to get him. Cab voucher completed and given to nurse for patient. CSW signing off as no other SW intervention services needed.  Sonja Manseau Givens, MSW, LCSW Licensed Clinical Social Worker [place] [phone]

## 2017-05-06 NOTE — Progress Notes (Signed)
Peripherally Inserted Central Catheter/Midline Placement  The IV Nurse has discussed with the patient and/or persons authorized to consent for the patient, the purpose of this procedure and the potential benefits and risks involved with this procedure.  The benefits include less needle sticks, lab draws from the catheter, and the patient may be discharged home with the catheter. Risks include, but not limited to, infection, bleeding, blood clot (thrombus formation), and puncture of an artery; nerve damage and irregular heartbeat and possibility to perform a PICC exchange if needed/ordered by physician.  Alternatives to this procedure were also discussed.  Bard Power PICC patient education guide, fact sheet on infection prevention and patient information card has been provided to patient /or left at bedside.    PICC/Midline Placement Documentation  PICC Single Lumen 72/90/21 PICC Right Basilic 41 cm 0 cm (Active)     PICC Single Lumen 05/06/17 PICC Right Cephalic 41 cm 0 cm (Active)  Indication for Insertion or Continuance of Line Home intravenous therapies (PICC only) 05/06/2017  8:10 PM  Exposed Catheter (cm) 0 cm 05/06/2017  8:10 PM  Site Assessment Clean;Dry;Intact 05/06/2017  8:10 PM  Line Status Blood return noted;Flushed;Saline locked 05/06/2017  8:10 PM  Dressing Type Transparent 05/06/2017  8:10 PM  Dressing Status Clean;Dry;Intact;Antimicrobial disc in place 05/06/2017  8:10 PM  Dressing Intervention New dressing 05/06/2017  8:10 PM  Dressing Change Due 05/13/17 05/06/2017  8:10 PM       Aldona Lento L 05/06/2017, 8:30 PM

## 2017-05-06 NOTE — Progress Notes (Signed)
Spoke with Dr. Bonner Puna about patient not having a ride since it is so late in the day and he has not received his PICC line placement yet. He is ok with patient going home in a cab once the line is placed. IV team aware and is still coming to place PICC line tonight.

## 2017-05-06 NOTE — Progress Notes (Signed)
PHARMACY CONSULT NOTE FOR:  OUTPATIENT  PARENTERAL ANTIBIOTIC THERAPY (OPAT)  Indication: mrsa bacteremia Regimen: vancomycin 750mg  IV q12h End date: 05/15/2017  IV antibiotic discharge orders are pended. To discharging provider:  please sign these orders via discharge navigator,  Select New Orders & click on the button choice - Manage This Unsigned Work.     Elicia Lamp, PharmD, BCPS Clinical Pharmacist 05/06/2017 11:15 AM

## 2017-05-06 NOTE — Progress Notes (Signed)
Patient discharge teaching complete. Meds, diet, activity, follow up appointments and incision care reviewed and all questions answered.  Copy of instructions given to patient. No prescription for pain medicine given to patient, patient already has meds at home.

## 2017-05-06 NOTE — Progress Notes (Signed)
Physical Therapy Note   Patient suffers from recent R transmetatarsal amputation with strict NWB status which impairs their ability to perform daily activities like walking, transfers, ADLs in the home.  A walker alone will not resolve the issues with performing activities of daily living. A wheelchair will allow patient to safely perform daily activities.  The patient can self propel in the home or has a caregiver who can provide assistance.     Keith [name], [place]  Acute Rehabilitation Services Pager [phone] Office [phone]

## 2017-05-07 ENCOUNTER — Telehealth (INDEPENDENT_AMBULATORY_CARE_PROVIDER_SITE_OTHER): Payer: Self-pay | Admitting: Orthopedic Surgery

## 2017-05-07 ENCOUNTER — Ambulatory Visit (INDEPENDENT_AMBULATORY_CARE_PROVIDER_SITE_OTHER): Payer: Medicare Other | Admitting: Family

## 2017-05-07 ENCOUNTER — Encounter (INDEPENDENT_AMBULATORY_CARE_PROVIDER_SITE_OTHER): Payer: Self-pay | Admitting: Family

## 2017-05-07 VITALS — Ht 69.0 in | Wt 243.0 lb

## 2017-05-07 DIAGNOSIS — E1169 Type 2 diabetes mellitus with other specified complication: Secondary | ICD-10-CM | POA: Diagnosis not present

## 2017-05-07 DIAGNOSIS — N182 Chronic kidney disease, stage 2 (mild): Secondary | ICD-10-CM | POA: Diagnosis not present

## 2017-05-07 DIAGNOSIS — Z4781 Encounter for orthopedic aftercare following surgical amputation: Secondary | ICD-10-CM | POA: Diagnosis not present

## 2017-05-07 DIAGNOSIS — M86171 Other acute osteomyelitis, right ankle and foot: Secondary | ICD-10-CM | POA: Diagnosis not present

## 2017-05-07 DIAGNOSIS — Z89421 Acquired absence of other right toe(s): Secondary | ICD-10-CM | POA: Diagnosis not present

## 2017-05-07 DIAGNOSIS — I129 Hypertensive chronic kidney disease with stage 1 through stage 4 chronic kidney disease, or unspecified chronic kidney disease: Secondary | ICD-10-CM | POA: Diagnosis not present

## 2017-05-07 DIAGNOSIS — B20 Human immunodeficiency virus [HIV] disease: Secondary | ICD-10-CM | POA: Diagnosis not present

## 2017-05-07 DIAGNOSIS — E1122 Type 2 diabetes mellitus with diabetic chronic kidney disease: Secondary | ICD-10-CM | POA: Diagnosis not present

## 2017-05-07 DIAGNOSIS — L03115 Cellulitis of right lower limb: Secondary | ICD-10-CM | POA: Diagnosis not present

## 2017-05-07 DIAGNOSIS — Z452 Encounter for adjustment and management of vascular access device: Secondary | ICD-10-CM | POA: Diagnosis not present

## 2017-05-07 DIAGNOSIS — Z89431 Acquired absence of right foot: Secondary | ICD-10-CM | POA: Insufficient documentation

## 2017-05-07 DIAGNOSIS — Z5181 Encounter for therapeutic drug level monitoring: Secondary | ICD-10-CM | POA: Diagnosis not present

## 2017-05-07 DIAGNOSIS — Z794 Long term (current) use of insulin: Secondary | ICD-10-CM | POA: Diagnosis not present

## 2017-05-07 IMAGING — CR DG HAND COMPLETE 3+V*R*
3 series · 3 of 3 positions shown · non-contrast
Comparison: 06/04/2016

CLINICAL DATA: Soft tissue swelling in right little finger

EXAM:
RIGHT HAND - COMPLETE 3+ VIEW

[hand pa]
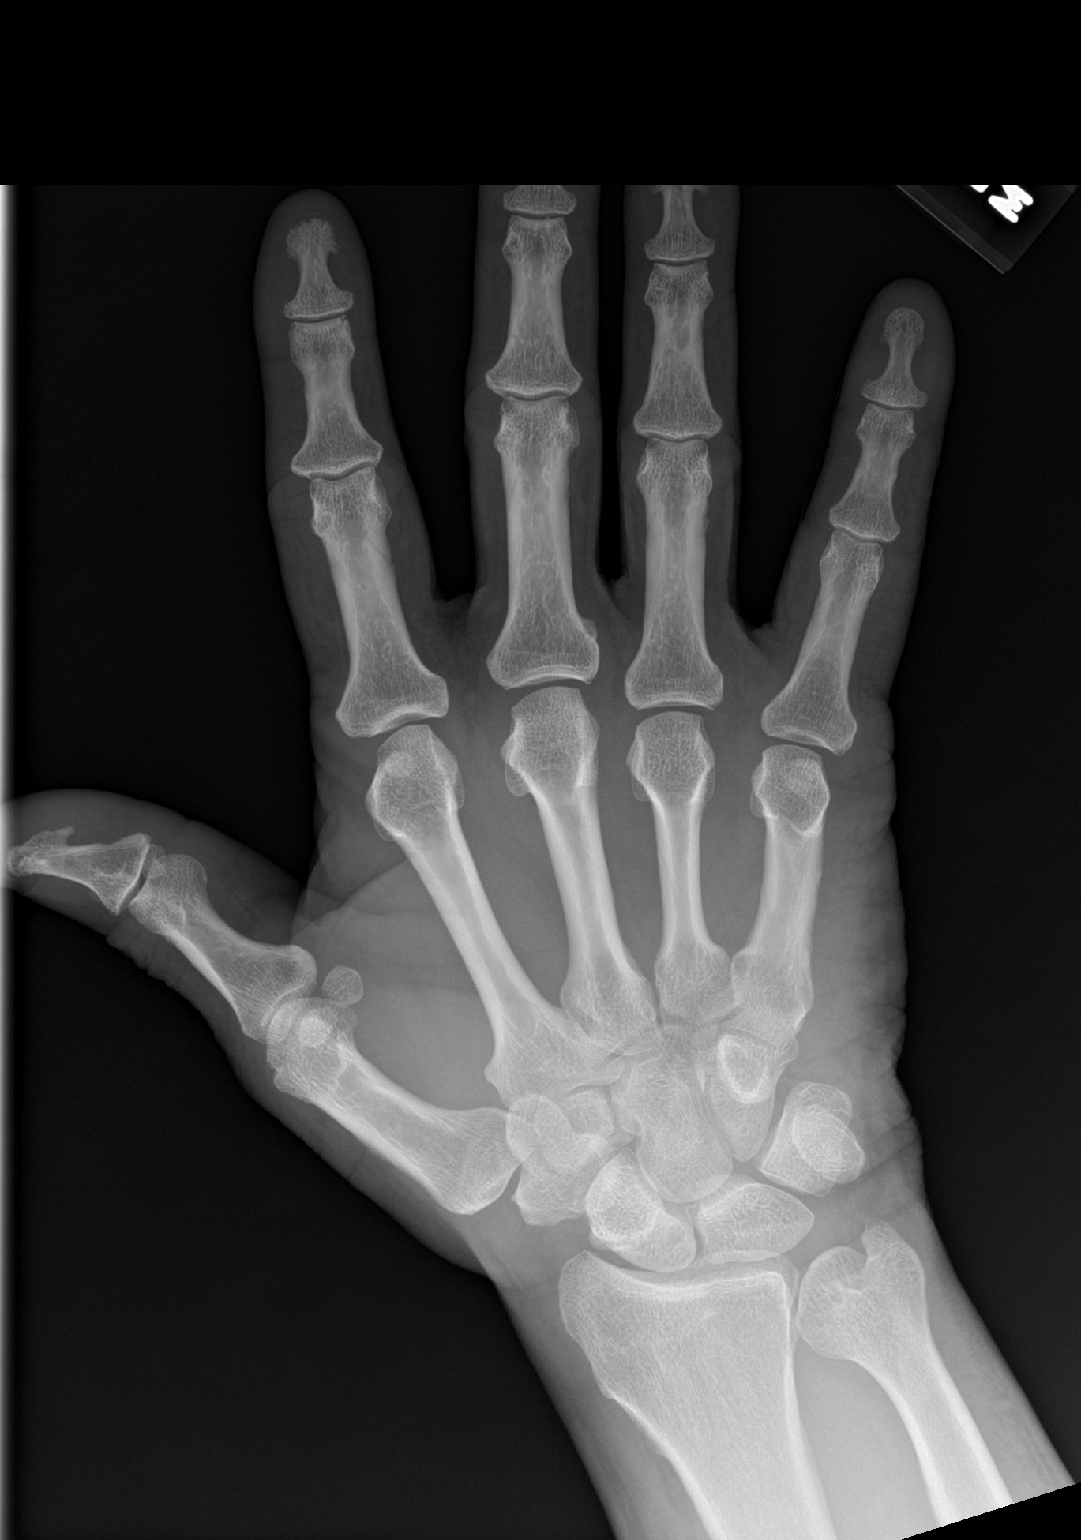

[hand obl]
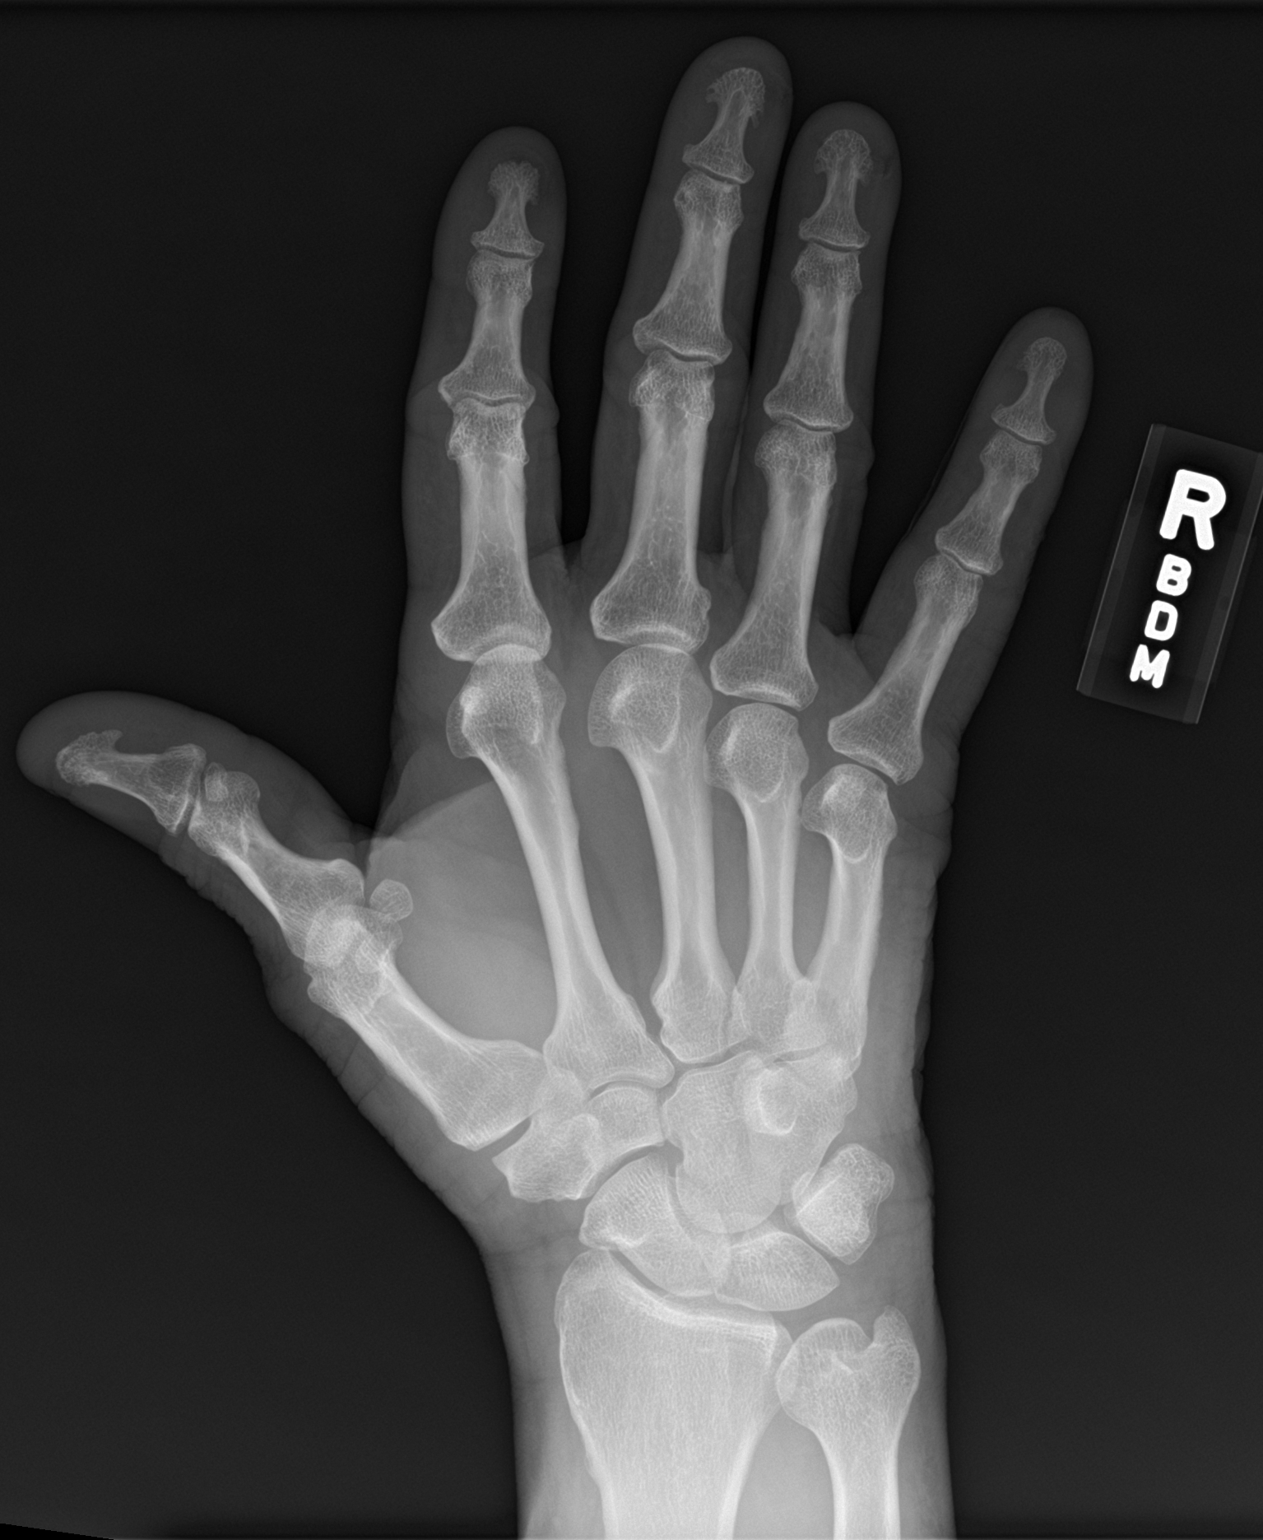

[hand lat]
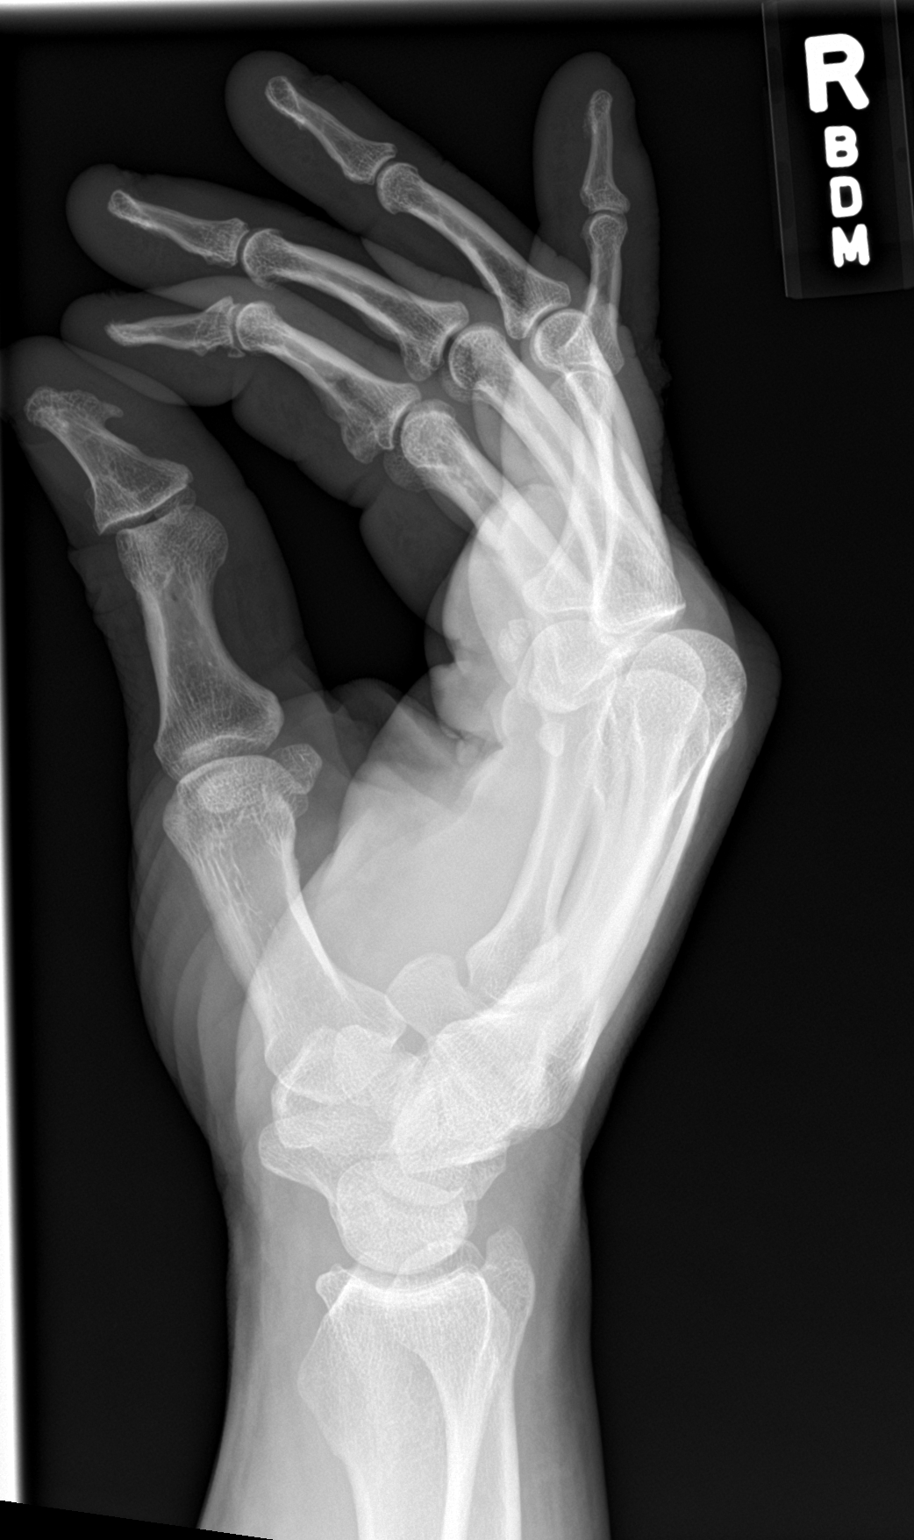

[3 of 3 positions shown; findings below may reference images not displayed]

FINDINGS: Improving soft tissue swelling in the right little finger since
prior study. No fracture, subluxation, dislocation or radiopaque
foreign body. Joint spaces are maintained.
IMPRESSION: No acute bony abnormality. Decreasing right little finger soft
tissue swelling.

## 2017-05-07 MED ORDER — OXYCODONE-ACETAMINOPHEN 10-325 MG PO TABS: 1.0000 | ORAL_TABLET | Freq: Three times a day (TID) | ORAL | 0 refills | Status: DC | PRN

## 2017-05-07 NOTE — Telephone Encounter (Signed)
I called and spoke with Cecille Rubin, she advised patient was discharged home without any type of assistive device, they are working to find out why this happened. Patient was apparently discharged without pain medication, his cannister is full. Advised with all this going on he just needs to be seen. Advised patients family to have him here and we would work him into the schedule today at 315pm.

## 2017-05-07 NOTE — Telephone Encounter (Signed)
Lori with [facility] states patient was discharged yesterday and said that he did not get pain RX, he needs to set up an appt, and wants to notify [name] of patients fall out of the cab yeserday. She would like a call back # [phone]

## 2017-05-07 NOTE — Progress Notes (Signed)
Post-Op Visit Note   Patient: Keith [name]           Date of Birth: Apr 08, 1966           MRN: [id number] Visit Date: 05/07/2017 PCP: Scot Jun, FNP  Chief Complaint:  Chief Complaint  Patient presents with  . Right Foot - Routine Post Op    05/02/17 right foot transmet amputation     HPI:  HPI The patient is a 52 year old gentleman seen today status post right transmetatarsal amputation on 05/02/17. Was discharged home from hospital last night. States fell out of the cab last night striking his right foot.   prevena wound vac removed today.  Ortho Exam Incision approximated with sutures. Some gaping laterally. Granulation in incision bed. Minimal bleeding from incision. Has massive swelling to his foot. No weeping or ulcerations. Has ulceration just proximal from incision laterally. This has been skin grafted earlier this year, is 25 cm in diameter and 5 mm deep. 30% exudative tissue in wound bed. No drainage or surrounding erythema or warmth today.  Visit Diagnoses:  1. S/P transmetatarsal amputation of foot, right ([place])     Plan: Dynaflex wrap applied today for compression. Silver cell to ulcer laterally. Elevate and minimize weightberaing. This was discussed and emphasized. Follow up in office in 1 week. Will ask advance to assist with 3 times weekly compression wraps.   Follow-Up Instructions: Return in about 1 week (around 05/14/2017).   Imaging: No results found.  Orders:  No orders of the defined types were placed in this encounter.  No orders of the defined types were placed in this encounter.    PMFS History: Patient Active Problem List   Diagnosis Date Noted  . S/P transmetatarsal amputation of foot, right ([place]) 05/07/2017  . Wound infection   . Bacteremia   . Osteomyelitis of metatarsal ([place]) 04/28/2017  . Fever   . S/P split thickness skin graft 03/28/2017  . Abscess of right foot   . Sepsis ([place]) 03/15/2017  . CKD (chronic kidney disease)  stage 3, GFR 30-59 ml/min (HCC) 03/15/2017  . Diabetic polyneuropathy associated with type 2 diabetes mellitus ([place]) 01/23/2017  . Right foot ulcer, limited to breakdown of skin ([place]) 01/07/2017  . Osteomyelitis ([place]) 01/07/2017  . Dehydration 09/29/2016  . AKI (acute kidney injury) ([place]) 09/28/2016  . Diarrhea with dehydration 09/28/2016  . Nausea vomiting and diarrhea 09/28/2016  . Onychomycosis of multiple toenails with type 2 diabetes mellitus ([place]) 08/29/2015  . MRSA carrier 04/20/2014  . Testosterone deficiency 04/20/2014  . Genital warts 02/22/2014  . HIV disease ([place])   . Insulin-requiring or dependent type II diabetes mellitus ([place]) 02/04/2014  . Cough 05/04/2013  . Dental anomaly 11/20/2012  . Arthritis of right knee 02/23/2012  . Hyperlipidemia 11/10/2011  . Chronic pain 08/07/2011  . Meralgia paraesthetica 04/23/2011  . ERECTILE DYSFUNCTION 08/22/2008  . Essential hypertension 05/19/2008   Past Medical History:  Diagnosis Date  . AIDS ([place])   . Chronic knee pain    right  . Chronic pain   . Diabetes mellitus   . Diabetes type 2, uncontrolled ([place])    HgA1c 17.6 (04/27/2010)  . Diabetic foot ulcer ([place]) 01/2017   right foot  . Erectile dysfunction   . Genital warts   . HIV (human immunodeficiency virus infection) ([place]) 2009   CD4 count 100, VL 13800 (05/01/2010)  . Hypertension   . Osteomyelitis ([place])    h/o hand  Family History  Problem Relation Age of Onset  . Hypertension Mother   . Arthritis Father   . Hypertension Father   . Hypertension Brother   . Cancer Maternal Grandmother 62       unknown type of cancer  . Depression Paternal Grandmother     Past Surgical History:  Procedure Laterality Date  . AMPUTATION Right 05/02/2017   Procedure: AMPUTATION TRANSMETARSAL;  Surgeon: [name], MD;  Location: [place];  Service: Orthopedics;  Laterality: Right;  . HERNIA REPAIR    . I&D EXTREMITY Left 08/21/2014   Procedure: INCISION AND DRAINAGE LEFT  SMALL FINGER;  Surgeon: Leanora Cover, MD;  Location: [place];  Service: Orthopedics;  Laterality: Left;  . I&D EXTREMITY Right 03/18/2017   Procedure: IRRIGATION AND DEBRIDEMENT EXTREMITY;  Surgeon: [name], MD;  Location: [place];  Service: Orthopedics;  Laterality: Right;  . MINOR IRRIGATION AND DEBRIDEMENT OF WOUND Right 04/22/2014   Procedure: IRRIGATION AND DEBRIDEMENT OF RIGHT NECK ABCESS;  Surgeon: Jerrell Belfast, MD;  Location: [place];  Service: ENT;  Laterality: Right;  . MULTIPLE EXTRACTIONS WITH ALVEOLOPLASTY N/A 01/18/2013   Procedure: MULTIPLE EXTRACION 3, 6, 7, 10, 11, 13, 21, 22, 27, 28, 29, 30 WITH ALVEOLOPLASTY;  Surgeon: Gae Bon, DDS;  Location: [place];  Service: Oral Surgery;  Laterality: N/A;  . SKIN SPLIT GRAFT Right 03/21/2017   Procedure: IRRIGATION AND DEBRIDEMENT RIGHT FOOT AND APPLY SPLIT THICKNESS SKIN GRAFT AND WOUND VAC;  Surgeon: [name], MD;  Location: [place];  Service: Orthopedics;  Laterality: Right;  . TEE WITHOUT CARDIOVERSION N/A 05/02/2017   Procedure: TRANSESOPHAGEAL ECHOCARDIOGRAM (TEE);  Surgeon: Acie Fredrickson Wonda Cheng, MD;  Location[facility];  Service: Cardiovascular;  Laterality: N/A;  coincidental to orthopedic case   Social History   Occupational History  . Not on file  Tobacco Use  . Smoking status: Never Smoker  . Smokeless tobacco: Never Used  Substance and Sexual Activity  . Alcohol use: No    Alcohol/week: 0.0 oz  . Drug use: No  . Sexual activity: Yes    Partners: Female    Comment: given condoms

## 2017-05-08 ENCOUNTER — Telehealth: Payer: Self-pay | Admitting: *Deleted

## 2017-05-08 ENCOUNTER — Other Ambulatory Visit (INDEPENDENT_AMBULATORY_CARE_PROVIDER_SITE_OTHER): Payer: Self-pay

## 2017-05-08 DIAGNOSIS — B20 Human immunodeficiency virus [HIV] disease: Secondary | ICD-10-CM | POA: Diagnosis not present

## 2017-05-08 DIAGNOSIS — L03115 Cellulitis of right lower limb: Secondary | ICD-10-CM | POA: Diagnosis not present

## 2017-05-08 DIAGNOSIS — M86171 Other acute osteomyelitis, right ankle and foot: Secondary | ICD-10-CM | POA: Diagnosis not present

## 2017-05-08 DIAGNOSIS — E1169 Type 2 diabetes mellitus with other specified complication: Secondary | ICD-10-CM | POA: Diagnosis not present

## 2017-05-08 DIAGNOSIS — N182 Chronic kidney disease, stage 2 (mild): Secondary | ICD-10-CM | POA: Diagnosis not present

## 2017-05-08 DIAGNOSIS — Z4781 Encounter for orthopedic aftercare following surgical amputation: Secondary | ICD-10-CM | POA: Diagnosis not present

## 2017-05-08 DIAGNOSIS — Z5181 Encounter for therapeutic drug level monitoring: Secondary | ICD-10-CM | POA: Diagnosis not present

## 2017-05-08 DIAGNOSIS — I129 Hypertensive chronic kidney disease with stage 1 through stage 4 chronic kidney disease, or unspecified chronic kidney disease: Secondary | ICD-10-CM | POA: Diagnosis not present

## 2017-05-08 DIAGNOSIS — E1122 Type 2 diabetes mellitus with diabetic chronic kidney disease: Secondary | ICD-10-CM | POA: Diagnosis not present

## 2017-05-08 DIAGNOSIS — Z794 Long term (current) use of insulin: Secondary | ICD-10-CM | POA: Diagnosis not present

## 2017-05-08 LAB — CULTURE, BLOOD (ROUTINE X 2)
Culture: NO GROWTH
Culture: NO GROWTH
Special Requests: ADEQUATE
Special Requests: ADEQUATE

## 2017-05-08 NOTE — Telephone Encounter (Signed)
Nurse taking care of the patient called to report that he does not have a glucometer and she feels he needs one. Advised her he has a PCP and provider her the contact information.

## 2017-05-10 DIAGNOSIS — L03115 Cellulitis of right lower limb: Secondary | ICD-10-CM | POA: Diagnosis not present

## 2017-05-10 DIAGNOSIS — Z4781 Encounter for orthopedic aftercare following surgical amputation: Secondary | ICD-10-CM | POA: Diagnosis not present

## 2017-05-10 DIAGNOSIS — B20 Human immunodeficiency virus [HIV] disease: Secondary | ICD-10-CM | POA: Diagnosis not present

## 2017-05-10 DIAGNOSIS — M86171 Other acute osteomyelitis, right ankle and foot: Secondary | ICD-10-CM | POA: Diagnosis not present

## 2017-05-10 DIAGNOSIS — I129 Hypertensive chronic kidney disease with stage 1 through stage 4 chronic kidney disease, or unspecified chronic kidney disease: Secondary | ICD-10-CM | POA: Diagnosis not present

## 2017-05-10 DIAGNOSIS — E1169 Type 2 diabetes mellitus with other specified complication: Secondary | ICD-10-CM | POA: Diagnosis not present

## 2017-05-12 ENCOUNTER — Encounter: Payer: Self-pay | Admitting: Infectious Diseases

## 2017-05-12 DIAGNOSIS — E1169 Type 2 diabetes mellitus with other specified complication: Secondary | ICD-10-CM | POA: Diagnosis not present

## 2017-05-12 DIAGNOSIS — M86171 Other acute osteomyelitis, right ankle and foot: Secondary | ICD-10-CM | POA: Diagnosis not present

## 2017-05-12 DIAGNOSIS — I129 Hypertensive chronic kidney disease with stage 1 through stage 4 chronic kidney disease, or unspecified chronic kidney disease: Secondary | ICD-10-CM | POA: Diagnosis not present

## 2017-05-12 DIAGNOSIS — A4102 Sepsis due to Methicillin resistant Staphylococcus aureus: Secondary | ICD-10-CM | POA: Diagnosis not present

## 2017-05-12 DIAGNOSIS — Z4781 Encounter for orthopedic aftercare following surgical amputation: Secondary | ICD-10-CM | POA: Diagnosis not present

## 2017-05-12 DIAGNOSIS — L03115 Cellulitis of right lower limb: Secondary | ICD-10-CM | POA: Diagnosis not present

## 2017-05-12 DIAGNOSIS — B20 Human immunodeficiency virus [HIV] disease: Secondary | ICD-10-CM | POA: Diagnosis not present

## 2017-05-13 DIAGNOSIS — M86171 Other acute osteomyelitis, right ankle and foot: Secondary | ICD-10-CM | POA: Diagnosis not present

## 2017-05-13 DIAGNOSIS — L03115 Cellulitis of right lower limb: Secondary | ICD-10-CM | POA: Diagnosis not present

## 2017-05-13 DIAGNOSIS — I129 Hypertensive chronic kidney disease with stage 1 through stage 4 chronic kidney disease, or unspecified chronic kidney disease: Secondary | ICD-10-CM | POA: Diagnosis not present

## 2017-05-13 DIAGNOSIS — B20 Human immunodeficiency virus [HIV] disease: Secondary | ICD-10-CM | POA: Diagnosis not present

## 2017-05-13 DIAGNOSIS — Z4781 Encounter for orthopedic aftercare following surgical amputation: Secondary | ICD-10-CM | POA: Diagnosis not present

## 2017-05-13 DIAGNOSIS — E1169 Type 2 diabetes mellitus with other specified complication: Secondary | ICD-10-CM | POA: Diagnosis not present

## 2017-05-14 ENCOUNTER — Encounter (INDEPENDENT_AMBULATORY_CARE_PROVIDER_SITE_OTHER): Payer: Self-pay | Admitting: Family

## 2017-05-14 ENCOUNTER — Ambulatory Visit (INDEPENDENT_AMBULATORY_CARE_PROVIDER_SITE_OTHER): Payer: Medicare Other | Admitting: Family

## 2017-05-14 VITALS — Ht 69.0 in | Wt 243.0 lb

## 2017-05-14 DIAGNOSIS — Z89431 Acquired absence of right foot: Secondary | ICD-10-CM

## 2017-05-14 DIAGNOSIS — L97511 Non-pressure chronic ulcer of other part of right foot limited to breakdown of skin: Secondary | ICD-10-CM

## 2017-05-14 MED ORDER — OXYCODONE-ACETAMINOPHEN 10-325 MG PO TABS: 1.0000 | ORAL_TABLET | Freq: Three times a day (TID) | ORAL | 0 refills | Status: DC | PRN

## 2017-05-14 NOTE — Progress Notes (Signed)
Post-Op Visit Note   Patient: Keith [name]           Date of Birth: [birth year]           MRN: [id number] Visit Date: 05/14/2017 PCP: Scot Jun, FNP  Chief Complaint:  Chief Complaint  Patient presents with  . Right Foot - Routine Post Op    05/02/17 transmet amputation     HPI:  HPI The patient is a 52 year old gentleman seen today status post right transmetatarsal amputation on 05/02/17. Has been in a dynaflex wrap for last week. With [facility] doing changes.    Ortho Exam Incision approximated with sutures. Some gaping laterally. Granulation in incision bed. Minimal bleeding from incision. Has improved swelling to his foot with compressive wrap. No weeping or ulcerations to lower leg. Incision is much improved, no gaping today. Granulation laterally along incision. No drainage, odor or sign of infection. Has ulceration just proximal from incision laterally. This has been skin grafted earlier this year, is 20 mm in diameter and 4 mm deep. 30% exudative tissue in wound bed. No drainage or surrounding erythema or warmth today.  Visit Diagnoses:  No diagnosis found.  Plan: Dynaflex wrap applied today for compression. Silver cell to ulcer laterally. Elevate and minimize weightberaing. This was discussed and emphasized. Follow up in office in 1 week for suture removal. Pleased with improvement in incision healing over last week. Will ask advanced to assist with 3 times weekly compression wraps.   Follow-Up Instructions: No Follow-up on file.   Imaging: No results found.  Orders:  No orders of the defined types were placed in this encounter.  Meds ordered this encounter  Medications  . oxyCODONE-acetaminophen (PERCOCET) 10-325 MG tablet    Sig: Take 1 tablet by mouth every 8 (eight) hours as needed (breakthrough pain).    Dispense:  21 tablet    Refill:  0     PMFS History: Patient Active Problem List   Diagnosis Date Noted  . S/P transmetatarsal amputation of foot,  right ([place]) 05/07/2017  . Wound infection   . Bacteremia   . Osteomyelitis of metatarsal ([place]) 04/28/2017  . Fever   . S/P split thickness skin graft 03/28/2017  . Abscess of right foot   . Sepsis ([place]) 03/15/2017  . CKD (chronic kidney disease) stage 3, GFR 30-59 ml/min (HCC) 03/15/2017  . Diabetic polyneuropathy associated with type 2 diabetes mellitus ([place]) 01/23/2017  . Right foot ulcer, limited to breakdown of skin ([place]) 01/07/2017  . Osteomyelitis ([place]) 01/07/2017  . Dehydration 09/29/2016  . AKI (acute kidney injury) ([place]) 09/28/2016  . Diarrhea with dehydration 09/28/2016  . Nausea vomiting and diarrhea 09/28/2016  . Onychomycosis of multiple toenails with type 2 diabetes mellitus ([place]) 08/29/2015  . MRSA carrier 04/20/2014  . Testosterone deficiency 04/20/2014  . Genital warts 02/22/2014  . HIV disease ([place])   . Insulin-requiring or dependent type II diabetes mellitus ([place]) 02/04/2014  . Cough 05/04/2013  . Dental anomaly 11/20/2012  . Arthritis of right knee 02/23/2012  . Hyperlipidemia 11/10/2011  . Chronic pain 08/07/2011  . Meralgia paraesthetica 04/23/2011  . ERECTILE DYSFUNCTION 08/22/2008  . Essential hypertension 05/19/2008   Past Medical History:  Diagnosis Date  . AIDS ([place])   . Chronic knee pain    right  . Chronic pain   . Diabetes mellitus   . Diabetes type 2, uncontrolled ([place])    HgA1c 17.6 (04/27/2010)  . Diabetic foot ulcer ([place]) 01/2017  right foot  . Erectile dysfunction   . Genital warts   . HIV (human immunodeficiency virus infection) ([place]) 2009   CD4 count 100, VL 13800 (05/01/2010)  . Hypertension   . Osteomyelitis ([place])    h/o hand    Family History  Problem Relation Age of Onset  . Hypertension Mother   . Arthritis Father   . Hypertension Father   . Hypertension Brother   . Cancer Maternal Grandmother 36       unknown type of cancer  . Depression Paternal Grandmother     Past Surgical History:  Procedure Laterality Date    . AMPUTATION Right 05/02/2017   Procedure: AMPUTATION TRANSMETARSAL;  Surgeon: [name], MD;  Location: [place];  Service: Orthopedics;  Laterality: Right;  . HERNIA REPAIR    . I&D EXTREMITY Left 08/21/2014   Procedure: INCISION AND DRAINAGE LEFT SMALL FINGER;  Surgeon: Leanora Cover, MD;  Location: [place];  Service: Orthopedics;  Laterality: Left;  . I&D EXTREMITY Right 03/18/2017   Procedure: IRRIGATION AND DEBRIDEMENT EXTREMITY;  Surgeon: [name], MD;  Location: [place];  Service: Orthopedics;  Laterality: Right;  . MINOR IRRIGATION AND DEBRIDEMENT OF WOUND Right 04/22/2014   Procedure: IRRIGATION AND DEBRIDEMENT OF RIGHT NECK ABCESS;  Surgeon: Jerrell Belfast, MD;  Location: [place];  Service: ENT;  Laterality: Right;  . MULTIPLE EXTRACTIONS WITH ALVEOLOPLASTY N/A 01/18/2013   Procedure: MULTIPLE EXTRACION 3, 6, 7, 10, 11, 13, 21, 22, 27, 28, 29, 30 WITH ALVEOLOPLASTY;  Surgeon: Gae Bon, DDS;  Location: [place];  Service: Oral Surgery;  Laterality: N/A;  . SKIN SPLIT GRAFT Right 03/21/2017   Procedure: IRRIGATION AND DEBRIDEMENT RIGHT FOOT AND APPLY SPLIT THICKNESS SKIN GRAFT AND WOUND VAC;  Surgeon: [name], MD;  Location: [place];  Service: Orthopedics;  Laterality: Right;  . TEE WITHOUT CARDIOVERSION N/A 05/02/2017   Procedure: TRANSESOPHAGEAL ECHOCARDIOGRAM (TEE);  Surgeon: Acie Fredrickson Wonda Cheng, MD;  Location[facility];  Service: Cardiovascular;  Laterality: N/A;  coincidental to orthopedic case   Social History   Occupational History  . Not on file  Tobacco Use  . Smoking status: Never Smoker  . Smokeless tobacco: Never Used  Substance and Sexual Activity  . Alcohol use: No    Alcohol/week: 0.0 oz  . Drug use: No  . Sexual activity: Yes    Partners: Female    Comment: given condoms

## 2017-05-15 DIAGNOSIS — E1169 Type 2 diabetes mellitus with other specified complication: Secondary | ICD-10-CM | POA: Diagnosis not present

## 2017-05-15 DIAGNOSIS — M86171 Other acute osteomyelitis, right ankle and foot: Secondary | ICD-10-CM | POA: Diagnosis not present

## 2017-05-15 DIAGNOSIS — I129 Hypertensive chronic kidney disease with stage 1 through stage 4 chronic kidney disease, or unspecified chronic kidney disease: Secondary | ICD-10-CM | POA: Diagnosis not present

## 2017-05-15 DIAGNOSIS — B20 Human immunodeficiency virus [HIV] disease: Secondary | ICD-10-CM | POA: Diagnosis not present

## 2017-05-15 DIAGNOSIS — L03115 Cellulitis of right lower limb: Secondary | ICD-10-CM | POA: Diagnosis not present

## 2017-05-15 DIAGNOSIS — Z4781 Encounter for orthopedic aftercare following surgical amputation: Secondary | ICD-10-CM | POA: Diagnosis not present

## 2017-05-16 ENCOUNTER — Telehealth (INDEPENDENT_AMBULATORY_CARE_PROVIDER_SITE_OTHER): Payer: Self-pay | Admitting: Radiology

## 2017-05-16 ENCOUNTER — Other Ambulatory Visit (INDEPENDENT_AMBULATORY_CARE_PROVIDER_SITE_OTHER): Payer: Self-pay | Admitting: Family

## 2017-05-16 ENCOUNTER — Other Ambulatory Visit: Payer: Self-pay | Admitting: Pharmacist

## 2017-05-16 DIAGNOSIS — I129 Hypertensive chronic kidney disease with stage 1 through stage 4 chronic kidney disease, or unspecified chronic kidney disease: Secondary | ICD-10-CM | POA: Diagnosis not present

## 2017-05-16 DIAGNOSIS — M86171 Other acute osteomyelitis, right ankle and foot: Secondary | ICD-10-CM | POA: Diagnosis not present

## 2017-05-16 DIAGNOSIS — E1169 Type 2 diabetes mellitus with other specified complication: Secondary | ICD-10-CM | POA: Diagnosis not present

## 2017-05-16 DIAGNOSIS — B20 Human immunodeficiency virus [HIV] disease: Secondary | ICD-10-CM | POA: Diagnosis not present

## 2017-05-16 DIAGNOSIS — L03115 Cellulitis of right lower limb: Secondary | ICD-10-CM | POA: Diagnosis not present

## 2017-05-16 DIAGNOSIS — Z4781 Encounter for orthopedic aftercare following surgical amputation: Secondary | ICD-10-CM | POA: Diagnosis not present

## 2017-05-16 MED ORDER — OXYCODONE-ACETAMINOPHEN 10-325 MG PO TABS: 1.0000 | ORAL_TABLET | Freq: Three times a day (TID) | ORAL | 0 refills | Status: DC | PRN

## 2017-05-16 NOTE — Telephone Encounter (Signed)
I called to give verbal ok for walker and Clair Gulling HHPT states that the pt is unable to have one that insurance will cover because he received one several years ago. Pt will try to obtain one trough [employer]. Also advised that he is using crutches. Pt may only be weight bearing through the heel for transfers only

## 2017-05-16 NOTE — Telephone Encounter (Signed)
Clair Gulling called and left message on triage line, wants get clarification on patients weightbearing status.  Per pt he can use his heel as long as he is NOT using front of foot.  Also he would like to get a verbal order for a walker. Please call Clair Gulling back at [phone] to advise.

## 2017-05-20 ENCOUNTER — Inpatient Hospital Stay: Payer: Medicare Other | Admitting: Internal Medicine

## 2017-05-20 DIAGNOSIS — L03115 Cellulitis of right lower limb: Secondary | ICD-10-CM | POA: Diagnosis not present

## 2017-05-20 DIAGNOSIS — B20 Human immunodeficiency virus [HIV] disease: Secondary | ICD-10-CM | POA: Diagnosis not present

## 2017-05-20 DIAGNOSIS — M86171 Other acute osteomyelitis, right ankle and foot: Secondary | ICD-10-CM | POA: Diagnosis not present

## 2017-05-20 DIAGNOSIS — Z4781 Encounter for orthopedic aftercare following surgical amputation: Secondary | ICD-10-CM | POA: Diagnosis not present

## 2017-05-20 DIAGNOSIS — I129 Hypertensive chronic kidney disease with stage 1 through stage 4 chronic kidney disease, or unspecified chronic kidney disease: Secondary | ICD-10-CM | POA: Diagnosis not present

## 2017-05-20 DIAGNOSIS — E1169 Type 2 diabetes mellitus with other specified complication: Secondary | ICD-10-CM | POA: Diagnosis not present

## 2017-05-21 ENCOUNTER — Encounter (INDEPENDENT_AMBULATORY_CARE_PROVIDER_SITE_OTHER): Payer: Self-pay | Admitting: Orthopedic Surgery

## 2017-05-21 ENCOUNTER — Ambulatory Visit (INDEPENDENT_AMBULATORY_CARE_PROVIDER_SITE_OTHER): Payer: Medicare Other | Admitting: Orthopedic Surgery

## 2017-05-21 VITALS — Ht 69.0 in | Wt 243.0 lb

## 2017-05-21 DIAGNOSIS — Z89431 Acquired absence of right foot: Secondary | ICD-10-CM

## 2017-05-21 DIAGNOSIS — E1142 Type 2 diabetes mellitus with diabetic polyneuropathy: Secondary | ICD-10-CM

## 2017-05-21 MED ORDER — OXYCODONE-ACETAMINOPHEN 5-325 MG PO TABS: 1.0000 | ORAL_TABLET | Freq: Three times a day (TID) | ORAL | 0 refills | Status: DC | PRN

## 2017-05-21 NOTE — Progress Notes (Signed)
Office Visit Note   Patient: Keith [name]           Date of Birth: Jan 29, 1966           MRN: [id number] Visit Date: 05/21/2017              Requested by: Scot Jun, [place], [place] [address] PCP: Scot Jun, FNP  Chief Complaint  Patient presents with  . Right Foot - Routine Post Op    05/02/17 right transmet amputation       HPI: Patient is a 52 year old gentleman with diabetic insensate neuropathy he has dependent swelling for the right transmetatarsal amputation currently in a Dynaflex compression wrap.  Patient complains of increasing pain from the swelling.  He is 3 weeks out from surgery.  Assessment & Plan: Visit Diagnoses:  1. S/P transmetatarsal amputation of foot, right ([place])   2. Diabetic polyneuropathy associated with type 2 diabetes mellitus ([place])     Plan: Refill prescription provided for Percocet discussed the importance of elevation with minimizing weightbearing.  We will reapply the Dynaflex compression wrap and follow-up in 1 week to harvest the sutures.  We will apply Iodosorb to the ulcer base of the fifth metatarsal right foot.  Follow-Up Instructions: Return in about 1 week (around 05/28/2017).   Ortho Exam  Patient is alert, oriented, no adenopathy, well-dressed, normal affect, normal respiratory effort. Examination the foot has swelling the wound edges are well approximated there is no odor no drainage.  There is no change in the ulcer over the base of the fifth metatarsal this is about 10 mm in diameter 1 mm deep.  Imaging: No results found. No images are attached to the encounter.  Labs: Lab Results  Component Value Date   HGBA1C 10.1 (H) 03/15/2017   HGBA1C 8.2 01/01/2017   HGBA1C 8.5 10/24/2016   ESRSEDRATE 140 (H) 04/28/2017   ESRSEDRATE 127 (H) 03/15/2017   ESRSEDRATE 94 (H) 01/07/2017   CRP 14.9 (H) 04/28/2017   CRP 23.1 (H) 03/15/2017   CRP 1.8 (H) 01/08/2017   LABURIC 6.3 08/07/2009   REPTSTATUS  05/08/2017 FINAL 05/03/2017   GRAMSTAIN  04/29/2017    NO WBC SEEN RARE GRAM POSITIVE COCCI IN PAIRS IN SINGLES    CULT NO GROWTH 5 DAYS 05/03/2017   LABORGA METHICILLIN RESISTANT STAPHYLOCOCCUS AUREUS 04/29/2017    @LABSALLVALUES (HGBA1)@  Body mass index is 35.88 kg/m.  Orders:  No orders of the defined types were placed in this encounter.  Meds ordered this encounter  Medications  . oxyCODONE-acetaminophen (PERCOCET/ROXICET) 5-325 MG tablet    Sig: Take 1 tablet by mouth every 8 (eight) hours as needed for severe pain.    Dispense:  20 tablet    Refill:  0     Procedures: No procedures performed  Clinical Data: No additional findings.  ROS:  All other systems negative, except as noted in the HPI. Review of Systems  Objective: Vital Signs: Ht 5\' 9"  (1.753 m)   Wt 243 lb (110.2 kg)   BMI 35.88 kg/m   Specialty Comments:  No specialty comments available.  PMFS History: Patient Active Problem List   Diagnosis Date Noted  . S/P transmetatarsal amputation of foot, right ([place]) 05/07/2017  . Wound infection   . Bacteremia   . Osteomyelitis of metatarsal ([place]) 04/28/2017  . Fever   . S/P split thickness skin graft 03/28/2017  . Abscess of right foot   . Sepsis ([place]) 03/15/2017  .  CKD (chronic kidney disease) stage 3, GFR 30-59 ml/min (HCC) 03/15/2017  . Diabetic polyneuropathy associated with type 2 diabetes mellitus ([place]) 01/23/2017  . Right foot ulcer, limited to breakdown of skin ([place]) 01/07/2017  . Osteomyelitis ([place]) 01/07/2017  . Dehydration 09/29/2016  . AKI (acute kidney injury) ([place]) 09/28/2016  . Diarrhea with dehydration 09/28/2016  . Nausea vomiting and diarrhea 09/28/2016  . Onychomycosis of multiple toenails with type 2 diabetes mellitus ([place]) 08/29/2015  . MRSA carrier 04/20/2014  . Testosterone deficiency 04/20/2014  . Genital warts 02/22/2014  . HIV disease ([place])   . Insulin-requiring or dependent type II diabetes mellitus ([place])  02/04/2014  . Cough 05/04/2013  . Dental anomaly 11/20/2012  . Arthritis of right knee 02/23/2012  . Hyperlipidemia 11/10/2011  . Chronic pain 08/07/2011  . Meralgia paraesthetica 04/23/2011  . ERECTILE DYSFUNCTION 08/22/2008  . Essential hypertension 05/19/2008   Past Medical History:  Diagnosis Date  . AIDS ([place])   . Chronic knee pain    right  . Chronic pain   . Diabetes mellitus   . Diabetes type 2, uncontrolled ([place])    HgA1c 17.6 (04/27/2010)  . Diabetic foot ulcer ([place]) 01/2017   right foot  . Erectile dysfunction   . Genital warts   . HIV (human immunodeficiency virus infection) ([place]) 2009   CD4 count 100, VL 13800 (05/01/2010)  . Hypertension   . Osteomyelitis ([place])    h/o hand    Family History  Problem Relation Age of Onset  . Hypertension Mother   . Arthritis Father   . Hypertension Father   . Hypertension Brother   . Cancer Maternal Grandmother 79       unknown type of cancer  . Depression Paternal Grandmother     Past Surgical History:  Procedure Laterality Date  . AMPUTATION Right 05/02/2017   Procedure: AMPUTATION TRANSMETARSAL;  Surgeon: [name], MD;  Location: [facility];  Service: Orthopedics;  Laterality: Right;  . HERNIA REPAIR    . I&D EXTREMITY Left 08/21/2014   Procedure: INCISION AND DRAINAGE LEFT SMALL FINGER;  Surgeon: Leanora Cover, MD;  Location: [facility];  Service: Orthopedics;  Laterality: Left;  . I&D EXTREMITY Right 03/18/2017   Procedure: IRRIGATION AND DEBRIDEMENT EXTREMITY;  Surgeon: [name], MD;  Location: [facility];  Service: Orthopedics;  Laterality: Right;  . MINOR IRRIGATION AND DEBRIDEMENT OF WOUND Right 04/22/2014   Procedure: IRRIGATION AND DEBRIDEMENT OF RIGHT NECK ABCESS;  Surgeon: Jerrell Belfast, MD;  Location: [facility];  Service: ENT;  Laterality: Right;  . MULTIPLE EXTRACTIONS WITH ALVEOLOPLASTY N/A 01/18/2013   Procedure: MULTIPLE EXTRACION 3, 6, 7, 10, 11, 13, 21, 22, 27, 28, 29, 30 WITH ALVEOLOPLASTY;  Surgeon: Gae Bon, DDS;  Location: [facility];  Service: Oral Surgery;  Laterality: N/A;  . SKIN SPLIT GRAFT Right 03/21/2017   Procedure: IRRIGATION AND DEBRIDEMENT RIGHT FOOT AND APPLY SPLIT THICKNESS SKIN GRAFT AND WOUND VAC;  Surgeon: [name], MD;  Location: [facility];  Service: Orthopedics;  Laterality: Right;  . TEE WITHOUT CARDIOVERSION N/A 05/02/2017   Procedure: TRANSESOPHAGEAL ECHOCARDIOGRAM (TEE);  Surgeon: Acie Fredrickson Wonda Cheng, MD;  Location[facility];  Service: Cardiovascular;  Laterality: N/A;  coincidental to orthopedic case   Social History   Occupational History  . Not on file  Tobacco Use  . Smoking status: Never Smoker  . Smokeless tobacco: Never Used  Substance and Sexual Activity  . Alcohol use: No    Alcohol/week: 0.0 oz  .  Drug use: No  . Sexual activity: Yes    Partners: Female    Comment: given condoms

## 2017-05-22 ENCOUNTER — Inpatient Hospital Stay: Payer: Medicare Other | Admitting: Infectious Diseases

## 2017-05-26 ENCOUNTER — Ambulatory Visit (INDEPENDENT_AMBULATORY_CARE_PROVIDER_SITE_OTHER): Payer: Medicare Other | Admitting: Orthopedic Surgery

## 2017-05-26 ENCOUNTER — Telehealth (INDEPENDENT_AMBULATORY_CARE_PROVIDER_SITE_OTHER): Payer: Self-pay | Admitting: Orthopedic Surgery

## 2017-05-26 ENCOUNTER — Encounter (INDEPENDENT_AMBULATORY_CARE_PROVIDER_SITE_OTHER): Payer: Self-pay | Admitting: Orthopedic Surgery

## 2017-05-26 VITALS — Ht 69.0 in | Wt 243.0 lb

## 2017-05-26 DIAGNOSIS — Z89431 Acquired absence of right foot: Secondary | ICD-10-CM

## 2017-05-26 DIAGNOSIS — L97511 Non-pressure chronic ulcer of other part of right foot limited to breakdown of skin: Secondary | ICD-10-CM

## 2017-05-26 NOTE — Progress Notes (Signed)
Office Visit Note   Patient: Keith [name]           Date of Birth: [birth year]           MRN: [id number] Visit Date: 05/26/2017              Requested by: Scot Jun, [place], [place] [id number] PCP: Scot Jun, FNP  Chief Complaint  Patient presents with  . Right Foot - Pain    05/02/17 right tansmet amputation       HPI: Patient is a 52 year old gentleman with diabetic insensate neuropathy he has dependent swelling for the right transmetatarsal amputation currently in a Dynaflex compression wrap.  Patient complains of increasing pain from the swelling.  He is 4 weeks out from surgery.  Assessment & Plan: Visit Diagnoses:  No diagnosis found.  Plan: Discussed the importance of elevation with minimizing weightbearing.  We will reapply the Dynaflex compression wrap and follow-up in 1 week to harvest the sutures.  We will apply Iodosorb to the ulcer base of the fifth metatarsal right foot.  Follow-Up Instructions: Return in about 1 week (around 06/02/2017).   Ortho Exam  Patient is alert, oriented, no adenopathy, well-dressed, normal affect, normal respiratory effort. Examination the foot has swelling. the wound edges are well approximated. Maceration to surrounding tissue from drainage. there is no odor.  There is no change in the ulcer over the fifth metatarsal this is about 10 mm in diameter 1 mm deep. Is 75% exudative tissue with 25% granulation which is bledding today.  Imaging: No results found. No images are attached to the encounter.  Labs: Lab Results  Component Value Date   HGBA1C 10.1 (H) 03/15/2017   HGBA1C 8.2 01/01/2017   HGBA1C 8.5 10/24/2016   ESRSEDRATE 140 (H) 04/28/2017   ESRSEDRATE 127 (H) 03/15/2017   ESRSEDRATE 94 (H) 01/07/2017   CRP 14.9 (H) 04/28/2017   CRP 23.1 (H) 03/15/2017   CRP 1.8 (H) 01/08/2017   LABURIC 6.3 08/07/2009   REPTSTATUS 05/08/2017 FINAL 05/03/2017   GRAMSTAIN  04/29/2017    NO WBC SEEN RARE  GRAM POSITIVE COCCI IN PAIRS IN SINGLES    CULT NO GROWTH 5 DAYS 05/03/2017   LABORGA METHICILLIN RESISTANT STAPHYLOCOCCUS AUREUS 04/29/2017    @LABSALLVALUES (HGBA1)@  Body mass index is 35.88 kg/m.  Orders:  No orders of the defined types were placed in this encounter.  No orders of the defined types were placed in this encounter.    Procedures: No procedures performed  Clinical Data: No additional findings.  ROS:  All other systems negative, except as noted in the HPI. Review of Systems  Constitutional: Negative for chills and fever.    Objective: Vital Signs: Ht 5\' 9"  (1.753 m)   Wt 243 lb (110.2 kg)   BMI 35.88 kg/m   Specialty Comments:  No specialty comments available.  PMFS History: Patient Active Problem List   Diagnosis Date Noted  . S/P transmetatarsal amputation of foot, right ([place]) 05/07/2017  . Wound infection   . Bacteremia   . Osteomyelitis of metatarsal ([place]) 04/28/2017  . Fever   . S/P split thickness skin graft 03/28/2017  . Abscess of right foot   . Sepsis ([place]) 03/15/2017  . CKD (chronic kidney disease) stage 3, GFR 30-59 ml/min (HCC) 03/15/2017  . Diabetic polyneuropathy associated with type 2 diabetes mellitus ([place]) 01/23/2017  . Right foot ulcer, limited to breakdown of skin ([place]) 01/07/2017  . Osteomyelitis ([place]) 01/07/2017  .  Dehydration 09/29/2016  . AKI (acute kidney injury) ([place]) 09/28/2016  . Diarrhea with dehydration 09/28/2016  . Nausea vomiting and diarrhea 09/28/2016  . Onychomycosis of multiple toenails with type 2 diabetes mellitus ([place]) 08/29/2015  . MRSA carrier 04/20/2014  . Testosterone deficiency 04/20/2014  . Genital warts 02/22/2014  . HIV disease ([place])   . Insulin-requiring or dependent type II diabetes mellitus ([place]) 02/04/2014  . Cough 05/04/2013  . Dental anomaly 11/20/2012  . Arthritis of right knee 02/23/2012  . Hyperlipidemia 11/10/2011  . Chronic pain 08/07/2011  . Meralgia paraesthetica 04/23/2011    . ERECTILE DYSFUNCTION 08/22/2008  . Essential hypertension 05/19/2008   Past Medical History:  Diagnosis Date  . AIDS ([place])   . Chronic knee pain    right  . Chronic pain   . Diabetes mellitus   . Diabetes type 2, uncontrolled ([place])    HgA1c 17.6 (04/27/2010)  . Diabetic foot ulcer ([place]) 01/2017   right foot  . Erectile dysfunction   . Genital warts   . HIV (human immunodeficiency virus infection) ([place]) 2009   CD4 count 100, VL 13800 (05/01/2010)  . Hypertension   . Osteomyelitis ([place])    h/o hand    Family History  Problem Relation Age of Onset  . Hypertension Mother   . Arthritis Father   . Hypertension Father   . Hypertension Brother   . Cancer Maternal Grandmother 1       unknown type of cancer  . Depression Paternal Grandmother     Past Surgical History:  Procedure Laterality Date  . AMPUTATION Right 05/02/2017   Procedure: AMPUTATION TRANSMETARSAL;  Surgeon: [name], MD;  Location: [facility];  Service: Orthopedics;  Laterality: Right;  . HERNIA REPAIR    . I&D EXTREMITY Left 08/21/2014   Procedure: INCISION AND DRAINAGE LEFT SMALL FINGER;  Surgeon: Leanora Cover, MD;  Location: [facility];  Service: Orthopedics;  Laterality: Left;  . I&D EXTREMITY Right 03/18/2017   Procedure: IRRIGATION AND DEBRIDEMENT EXTREMITY;  Surgeon: [name], MD;  Location: [facility];  Service: Orthopedics;  Laterality: Right;  . MINOR IRRIGATION AND DEBRIDEMENT OF WOUND Right 04/22/2014   Procedure: IRRIGATION AND DEBRIDEMENT OF RIGHT NECK ABCESS;  Surgeon: Jerrell Belfast, MD;  Location: [facility];  Service: ENT;  Laterality: Right;  . MULTIPLE EXTRACTIONS WITH ALVEOLOPLASTY N/A 01/18/2013   Procedure: MULTIPLE EXTRACION 3, 6, 7, 10, 11, 13, 21, 22, 27, 28, 29, 30 WITH ALVEOLOPLASTY;  Surgeon: Gae Bon, DDS;  Location: [facility];  Service: Oral Surgery;  Laterality: N/A;  . SKIN SPLIT GRAFT Right 03/21/2017   Procedure: IRRIGATION AND DEBRIDEMENT RIGHT FOOT AND APPLY SPLIT THICKNESS SKIN  GRAFT AND WOUND VAC;  Surgeon: [name], MD;  Location: [facility];  Service: Orthopedics;  Laterality: Right;  . TEE WITHOUT CARDIOVERSION N/A 05/02/2017   Procedure: TRANSESOPHAGEAL ECHOCARDIOGRAM (TEE);  Surgeon: Acie Fredrickson Wonda Cheng, MD;  Location: [facility] OR;  Service: Cardiovascular;  Laterality: N/A;  coincidental to orthopedic case   Social History   Occupational History  . Not on file  Tobacco Use  . Smoking status: Never Smoker  . Smokeless tobacco: Never Used  Substance and Sexual Activity  . Alcohol use: No    Alcohol/week: 0.0 oz  . Drug use: No  . Sexual activity: Yes    Partners: Female    Comment: given condoms

## 2017-05-26 NOTE — Telephone Encounter (Signed)
Pt walked into office today and had appt with Erin. Dressing removed and reapplied. No rx given to pt today. Narcotic database shows Oxycodone 10 mg given on 05/20/17 and then Oxycodone 5/325 given by our office next day. Pt has had a total of 323 tabs of pain medication so far this year. No refills from this office.

## 2017-05-26 NOTE — Telephone Encounter (Signed)
Pt is still in a lot of pain please call pt to dicuss his pt care

## 2017-05-29 ENCOUNTER — Ambulatory Visit (INDEPENDENT_AMBULATORY_CARE_PROVIDER_SITE_OTHER): Payer: Medicare Other | Admitting: Orthopedic Surgery

## 2017-06-02 ENCOUNTER — Ambulatory Visit (INDEPENDENT_AMBULATORY_CARE_PROVIDER_SITE_OTHER): Payer: Medicare Other | Admitting: Orthopedic Surgery

## 2017-06-02 ENCOUNTER — Encounter (INDEPENDENT_AMBULATORY_CARE_PROVIDER_SITE_OTHER): Payer: Self-pay | Admitting: Orthopedic Surgery

## 2017-06-02 VITALS — Ht 69.0 in | Wt 243.0 lb

## 2017-06-02 DIAGNOSIS — L97511 Non-pressure chronic ulcer of other part of right foot limited to breakdown of skin: Secondary | ICD-10-CM | POA: Diagnosis not present

## 2017-06-02 DIAGNOSIS — Z89431 Acquired absence of right foot: Secondary | ICD-10-CM | POA: Diagnosis not present

## 2017-06-02 DIAGNOSIS — I87321 Chronic venous hypertension (idiopathic) with inflammation of right lower extremity: Secondary | ICD-10-CM

## 2017-06-02 MED ORDER — OXYCODONE-ACETAMINOPHEN 5-325 MG PO TABS: 1.0000 | ORAL_TABLET | Freq: Three times a day (TID) | ORAL | 0 refills | Status: DC | PRN

## 2017-06-02 NOTE — Progress Notes (Signed)
Office Visit Note   Patient: Keith [name]           Date of Birth: 1965-07-12           MRN: [id number] Visit Date: 06/02/2017              Requested by: Scot Jun, [place], [place] [address] PCP: Scot Jun, FNP  Chief Complaint  Patient presents with  . Right Foot - Routine Post Op    05/02/17 right transmet amputation       HPI: Patient is a 52 year old gentleman status post right transmetatarsal amputation with persistent swelling and drainage with venous insufficiency.  He has been in a compression wrap.  He has completed his IV antibiotics through a PICC line.  He has been full weightbearing in a Darco shoe and has had home health therapy come out to do gait training.  Assessment & Plan: Visit Diagnoses:  1. Right foot ulcer, limited to breakdown of skin ([place])   2. S/P transmetatarsal amputation of foot, right ([place])   3. Idiopathic chronic venous hypertension of right lower extremity with inflammation     Plan: Discussed the importance of not gait training not weightbearing on the right foot elevating the foot above his heart while he is seated and elevating the foot on a pillow when he sleeps at night.  We will reapply a Dynaflex wrap silver alginate dressing.  Note was provided for physical therapy to discontinue gait training only work on strengthening.  Follow-Up Instructions: Return in about 1 week (around 06/09/2017).   Ortho Exam  Patient is alert, oriented, no adenopathy, well-dressed, normal affect, normal respiratory effort. Examination patient has good wrinkling of the skin in his leg now there are no ulcers in his leg he still has hyper granulation tissue with clear drainage from the right foot.  There is maceration odor from the drainage.  Again reinforced the importance of elevation.  Imaging: No results found. No images are attached to the encounter.  Labs: Lab Results  Component Value Date   HGBA1C 10.1 (H) 03/15/2017     HGBA1C 8.2 01/01/2017   HGBA1C 8.5 10/24/2016   ESRSEDRATE 140 (H) 04/28/2017   ESRSEDRATE 127 (H) 03/15/2017   ESRSEDRATE 94 (H) 01/07/2017   CRP 14.9 (H) 04/28/2017   CRP 23.1 (H) 03/15/2017   CRP 1.8 (H) 01/08/2017   LABURIC 6.3 08/07/2009   REPTSTATUS 05/08/2017 FINAL 05/03/2017   GRAMSTAIN  04/29/2017    NO WBC SEEN RARE GRAM POSITIVE COCCI IN PAIRS IN SINGLES    CULT NO GROWTH 5 DAYS 05/03/2017   LABORGA METHICILLIN RESISTANT STAPHYLOCOCCUS AUREUS 04/29/2017    @LABSALLVALUES (XBJY7)@  Body mass index is 35.88 kg/m.  Orders:  No orders of the defined types were placed in this encounter.  No orders of the defined types were placed in this encounter.    Procedures: No procedures performed  Clinical Data: No additional findings.  ROS:  All other systems negative, except as noted in the HPI. Review of Systems  Objective: Vital Signs: Ht 5\' 9"  (1.753 m)   Wt 243 lb (110.2 kg)   BMI 35.88 kg/m   Specialty Comments:  No specialty comments available.  PMFS History: Patient Active Problem List   Diagnosis Date Noted  . S/P transmetatarsal amputation of foot, right ([place]) 05/07/2017  . Wound infection   . Bacteremia   . Fever   . Abscess of right foot   . Sepsis ([place])  03/15/2017  . CKD (chronic kidney disease) stage 3, GFR 30-59 ml/min (HCC) 03/15/2017  . Diabetic polyneuropathy associated with type 2 diabetes mellitus ([place]) 01/23/2017  . Right foot ulcer, limited to breakdown of skin ([place]) 01/07/2017  . Dehydration 09/29/2016  . AKI (acute kidney injury) ([place]) 09/28/2016  . Diarrhea with dehydration 09/28/2016  . Nausea vomiting and diarrhea 09/28/2016  . Onychomycosis of multiple toenails with type 2 diabetes mellitus ([place]) 08/29/2015  . MRSA carrier 04/20/2014  . Testosterone deficiency 04/20/2014  . Genital warts 02/22/2014  . HIV disease ([place])   . Insulin-requiring or dependent type II diabetes mellitus ([place]) 02/04/2014  . Cough 05/04/2013  .  Dental anomaly 11/20/2012  . Arthritis of right knee 02/23/2012  . Hyperlipidemia 11/10/2011  . Chronic pain 08/07/2011  . Meralgia paraesthetica 04/23/2011  . ERECTILE DYSFUNCTION 08/22/2008  . Essential hypertension 05/19/2008   Past Medical History:  Diagnosis Date  . AIDS ([place])   . Chronic knee pain    right  . Chronic pain   . Diabetes mellitus   . Diabetes type 2, uncontrolled ([place])    HgA1c 17.6 (04/27/2010)  . Diabetic foot ulcer ([place]) 01/2017   right foot  . Erectile dysfunction   . Genital warts   . HIV (human immunodeficiency virus infection) ([place]) 2009   CD4 count 100, VL 13800 (05/01/2010)  . Hypertension   . Osteomyelitis ([place])    h/o hand  . Osteomyelitis of metatarsal ([place]) 04/28/2017    Family History  Problem Relation Age of Onset  . Hypertension Mother   . Arthritis Father   . Hypertension Father   . Hypertension Brother   . Cancer Maternal Grandmother 11       unknown type of cancer  . Depression Paternal Grandmother     Past Surgical History:  Procedure Laterality Date  . AMPUTATION Right 05/02/2017   Procedure: AMPUTATION TRANSMETARSAL;  Surgeon: [name], MD;  Location: [facility];  Service: Orthopedics;  Laterality: Right;  . HERNIA REPAIR    . I&D EXTREMITY Left 08/21/2014   Procedure: INCISION AND DRAINAGE LEFT SMALL FINGER;  Surgeon: Leanora Cover, MD;  Location: [facility];  Service: Orthopedics;  Laterality: Left;  . I&D EXTREMITY Right 03/18/2017   Procedure: IRRIGATION AND DEBRIDEMENT EXTREMITY;  Surgeon: [name], MD;  Location: [facility];  Service: Orthopedics;  Laterality: Right;  . MINOR IRRIGATION AND DEBRIDEMENT OF WOUND Right 04/22/2014   Procedure: IRRIGATION AND DEBRIDEMENT OF RIGHT NECK ABCESS;  Surgeon: Jerrell Belfast, MD;  Location: [facility];  Service: ENT;  Laterality: Right;  . MULTIPLE EXTRACTIONS WITH ALVEOLOPLASTY N/A 01/18/2013   Procedure: MULTIPLE EXTRACION 3, 6, 7, 10, 11, 13, 21, 22, 27, 28, 29, 30 WITH ALVEOLOPLASTY;   Surgeon: Gae Bon, DDS;  Location: [facility];  Service: Oral Surgery;  Laterality: N/A;  . SKIN SPLIT GRAFT Right 03/21/2017   Procedure: IRRIGATION AND DEBRIDEMENT RIGHT FOOT AND APPLY SPLIT THICKNESS SKIN GRAFT AND WOUND VAC;  Surgeon: [name], MD;  Location: [facility];  Service: Orthopedics;  Laterality: Right;  . TEE WITHOUT CARDIOVERSION N/A 05/02/2017   Procedure: TRANSESOPHAGEAL ECHOCARDIOGRAM (TEE);  Surgeon: Acie Fredrickson Wonda Cheng, MD;  Location: [facility] OR;  Service: Cardiovascular;  Laterality: N/A;  coincidental to orthopedic case   Social History   Occupational History  . Not on file  Tobacco Use  . Smoking status: Never Smoker  . Smokeless tobacco: Never Used  Substance and Sexual Activity  . Alcohol use: No  Alcohol/week: 0.0 oz  . Drug use: No  . Sexual activity: Yes    Partners: Female    Comment: given condoms

## 2017-06-03 DIAGNOSIS — G894 Chronic pain syndrome: Secondary | ICD-10-CM | POA: Diagnosis not present

## 2017-06-03 DIAGNOSIS — M545 Low back pain: Secondary | ICD-10-CM | POA: Diagnosis not present

## 2017-06-03 DIAGNOSIS — M25569 Pain in unspecified knee: Secondary | ICD-10-CM | POA: Diagnosis not present

## 2017-06-03 DIAGNOSIS — G629 Polyneuropathy, unspecified: Secondary | ICD-10-CM | POA: Diagnosis not present

## 2017-06-03 DIAGNOSIS — Z79899 Other long term (current) drug therapy: Secondary | ICD-10-CM | POA: Diagnosis not present

## 2017-06-03 DIAGNOSIS — Z79891 Long term (current) use of opiate analgesic: Secondary | ICD-10-CM | POA: Diagnosis not present

## 2017-06-04 ENCOUNTER — Ambulatory Visit (INDEPENDENT_AMBULATORY_CARE_PROVIDER_SITE_OTHER): Payer: Medicare Other | Admitting: Orthopedic Surgery

## 2017-06-04 DIAGNOSIS — L03115 Cellulitis of right lower limb: Secondary | ICD-10-CM | POA: Diagnosis not present

## 2017-06-04 DIAGNOSIS — B20 Human immunodeficiency virus [HIV] disease: Secondary | ICD-10-CM | POA: Diagnosis not present

## 2017-06-04 DIAGNOSIS — I129 Hypertensive chronic kidney disease with stage 1 through stage 4 chronic kidney disease, or unspecified chronic kidney disease: Secondary | ICD-10-CM | POA: Diagnosis not present

## 2017-06-04 DIAGNOSIS — Z4781 Encounter for orthopedic aftercare following surgical amputation: Secondary | ICD-10-CM | POA: Diagnosis not present

## 2017-06-04 DIAGNOSIS — M86171 Other acute osteomyelitis, right ankle and foot: Secondary | ICD-10-CM | POA: Diagnosis not present

## 2017-06-04 DIAGNOSIS — E1169 Type 2 diabetes mellitus with other specified complication: Secondary | ICD-10-CM | POA: Diagnosis not present

## 2017-06-09 ENCOUNTER — Encounter (INDEPENDENT_AMBULATORY_CARE_PROVIDER_SITE_OTHER): Payer: Self-pay | Admitting: Orthopedic Surgery

## 2017-06-09 ENCOUNTER — Ambulatory Visit (INDEPENDENT_AMBULATORY_CARE_PROVIDER_SITE_OTHER): Payer: Medicare Other | Admitting: Orthopedic Surgery

## 2017-06-09 DIAGNOSIS — L02611 Cutaneous abscess of right foot: Secondary | ICD-10-CM

## 2017-06-09 DIAGNOSIS — Z89431 Acquired absence of right foot: Secondary | ICD-10-CM

## 2017-06-09 MED ORDER — DOXYCYCLINE HYCLATE 100 MG PO TABS: 100.0000 mg | ORAL_TABLET | Freq: Two times a day (BID) | ORAL | 0 refills | Status: DC

## 2017-06-09 MED ORDER — OXYCODONE-ACETAMINOPHEN 5-325 MG PO TABS: 1.0000 | ORAL_TABLET | Freq: Three times a day (TID) | ORAL | 0 refills | Status: DC | PRN
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
## 2017-06-12 ENCOUNTER — Other Ambulatory Visit (INDEPENDENT_AMBULATORY_CARE_PROVIDER_SITE_OTHER): Payer: Self-pay | Admitting: Orthopedic Surgery

## 2017-06-12 ENCOUNTER — Other Ambulatory Visit: Payer: Self-pay

## 2017-06-12 ENCOUNTER — Encounter (HOSPITAL_COMMUNITY): Payer: Self-pay | Admitting: *Deleted

## 2017-06-12 DIAGNOSIS — L02611 Cutaneous abscess of right foot: Secondary | ICD-10-CM

## 2017-06-12 NOTE — Progress Notes (Signed)
Patient verbalized understanding if insturctions,  Had questions about where the incision would be made, instructed patient to call Dr. Gavin Potters office for clarification for exact location of the incision

## 2017-06-13 ENCOUNTER — Inpatient Hospital Stay (HOSPITAL_COMMUNITY)
Admission: RE | Admit: 2017-06-13 | Discharge: 2017-06-17 | DRG: 475 | Disposition: A | Discharge status: Skilled Nursing Facility | Payer: Medicare Other | Source: Ambulatory Visit | Attending: Orthopedic Surgery | Admitting: Orthopedic Surgery

## 2017-06-13 ENCOUNTER — Inpatient Hospital Stay (HOSPITAL_COMMUNITY): Payer: Medicare Other | Admitting: Anesthesiology

## 2017-06-13 ENCOUNTER — Encounter (HOSPITAL_COMMUNITY)
Admission: RE | Disposition: A | Discharge status: Skilled Nursing Facility | Payer: Self-pay | Source: Ambulatory Visit | Attending: Orthopedic Surgery

## 2017-06-13 ENCOUNTER — Other Ambulatory Visit: Payer: Self-pay

## 2017-06-13 ENCOUNTER — Encounter (HOSPITAL_COMMUNITY): Payer: Self-pay | Admitting: *Deleted

## 2017-06-13 DIAGNOSIS — I70261 Atherosclerosis of native arteries of extremities with gangrene, right leg: Secondary | ICD-10-CM | POA: Diagnosis not present

## 2017-06-13 DIAGNOSIS — R262 Difficulty in walking, not elsewhere classified: Secondary | ICD-10-CM | POA: Diagnosis present

## 2017-06-13 DIAGNOSIS — Z4889 Encounter for other specified surgical aftercare: Secondary | ICD-10-CM | POA: Diagnosis not present

## 2017-06-13 DIAGNOSIS — B20 Human immunodeficiency virus [HIV] disease: Secondary | ICD-10-CM | POA: Diagnosis present

## 2017-06-13 DIAGNOSIS — I10 Essential (primary) hypertension: Secondary | ICD-10-CM | POA: Diagnosis present

## 2017-06-13 DIAGNOSIS — T8781 Dehiscence of amputation stump: Secondary | ICD-10-CM | POA: Diagnosis not present

## 2017-06-13 DIAGNOSIS — Z882 Allergy status to sulfonamides status: Secondary | ICD-10-CM | POA: Diagnosis not present

## 2017-06-13 DIAGNOSIS — Z8249 Family history of ischemic heart disease and other diseases of the circulatory system: Secondary | ICD-10-CM

## 2017-06-13 DIAGNOSIS — E119 Type 2 diabetes mellitus without complications: Secondary | ICD-10-CM | POA: Diagnosis present

## 2017-06-13 DIAGNOSIS — M6281 Muscle weakness (generalized): Secondary | ICD-10-CM | POA: Diagnosis present

## 2017-06-13 DIAGNOSIS — Z89511 Acquired absence of right leg below knee: Secondary | ICD-10-CM

## 2017-06-13 DIAGNOSIS — L97919 Non-pressure chronic ulcer of unspecified part of right lower leg with unspecified severity: Secondary | ICD-10-CM | POA: Diagnosis not present

## 2017-06-13 DIAGNOSIS — G8911 Acute pain due to trauma: Secondary | ICD-10-CM | POA: Diagnosis not present

## 2017-06-13 DIAGNOSIS — E876 Hypokalemia: Secondary | ICD-10-CM | POA: Diagnosis present

## 2017-06-13 DIAGNOSIS — L8992 Pressure ulcer of unspecified site, stage 2: Secondary | ICD-10-CM | POA: Diagnosis not present

## 2017-06-13 DIAGNOSIS — L02611 Cutaneous abscess of right foot: Secondary | ICD-10-CM | POA: Diagnosis present

## 2017-06-13 DIAGNOSIS — N183 Chronic kidney disease, stage 3 (moderate): Secondary | ICD-10-CM | POA: Diagnosis not present

## 2017-06-13 DIAGNOSIS — E1122 Type 2 diabetes mellitus with diabetic chronic kidney disease: Secondary | ICD-10-CM | POA: Diagnosis not present

## 2017-06-13 DIAGNOSIS — I129 Hypertensive chronic kidney disease with stage 1 through stage 4 chronic kidney disease, or unspecified chronic kidney disease: Secondary | ICD-10-CM | POA: Diagnosis not present

## 2017-06-13 DIAGNOSIS — Z6835 Body mass index (BMI) 35.0-35.9, adult: Secondary | ICD-10-CM

## 2017-06-13 HISTORY — PX: AMPUTATION: SHX166

## 2017-06-13 HISTORY — PX: BELOW KNEE LEG AMPUTATION: SUR23

## 2017-06-13 LAB — BASIC METABOLIC PANEL
Anion gap: 10 (ref 5–15)
BUN: 18 mg/dL (ref 6–20)
CO2: 24 mmol/L (ref 22–32)
Calcium: 9.4 mg/dL (ref 8.9–10.3)
Chloride: 103 mmol/L (ref 101–111)
Creatinine, Ser: 1.43 mg/dL — ABNORMAL HIGH (ref 0.61–1.24)
GFR calc Af Amer: >60 mL/min (ref >60)
GFR calc non Af Amer: 55 mL/min — ABNORMAL LOW (ref >60)
Glucose, Bld: 164 mg/dL — ABNORMAL HIGH (ref 65–99)
Potassium: 4 mmol/L (ref 3.5–5.1)
Sodium: 137 mmol/L (ref 135–145)

## 2017-06-13 LAB — GLUCOSE, CAPILLARY
Glucose-Capillary: 155 mg/dL — ABNORMAL HIGH (ref 65–99)
Glucose-Capillary: 184 mg/dL — ABNORMAL HIGH (ref 65–99)
Glucose-Capillary: 183 mg/dL — ABNORMAL HIGH (ref 65–99)

## 2017-06-13 LAB — CBC
HCT: 30.6 % — ABNORMAL LOW (ref 39.0–52.0)
Hemoglobin: 9.7 g/dL — ABNORMAL LOW (ref 13.0–17.0)
MCH: 26.6 pg (ref 26.0–34.0)
MCHC: 31.7 g/dL (ref 30.0–36.0)
MCV: 84.1 fL (ref 78.0–100.0)
Platelets: 337 10*3/uL (ref 150–400)
RBC: 3.64 MIL/uL — ABNORMAL LOW (ref 4.22–5.81)
RDW: 14.6 % (ref 11.5–15.5)
WBC: 7 10*3/uL (ref 4.0–10.5)

## 2017-06-13 LAB — SURGICAL PCR SCREEN
MRSA, PCR: POSITIVE — AB
Staphylococcus aureus: POSITIVE — AB

## 2017-06-13 SURGERY — AMPUTATION BELOW KNEE
Anesthesia: General | Laterality: Right

## 2017-06-13 MED ORDER — PHENYLEPHRINE 40 MCG/ML (10ML) SYRINGE FOR IV PUSH (FOR BLOOD PRESSURE SUPPORT)
PREFILLED_SYRINGE | INTRAVENOUS | Status: DC | PRN
  Administered 2017-06-13 (×3): 80 ug via INTRAVENOUS

## 2017-06-13 MED ORDER — GABAPENTIN 300 MG PO CAPS
300.0000 mg | ORAL_CAPSULE | Freq: Three times a day (TID) | ORAL | Status: DC
  Administered 2017-06-13 – 2017-06-17 (×13): 300 mg via ORAL
  Filled 2017-06-13 (×13): qty 1

## 2017-06-13 MED ORDER — METOCLOPRAMIDE HCL 5 MG/ML IJ SOLN: 5.0000 mg | Freq: Three times a day (TID) | INTRAMUSCULAR | Status: DC | PRN

## 2017-06-13 MED ORDER — INSULIN GLARGINE 100 UNIT/ML ~~LOC~~ SOLN
15.0000 [IU] | Freq: Every day | SUBCUTANEOUS | Status: DC
  Administered 2017-06-13 – 2017-06-15 (×3): 15 [IU] via SUBCUTANEOUS
  Filled 2017-06-13 (×5): qty 0.15

## 2017-06-13 MED ORDER — BISACODYL 10 MG RE SUPP: 10.0000 mg | Freq: Every day | RECTAL | Status: DC | PRN

## 2017-06-13 MED ORDER — VANCOMYCIN HCL IN DEXTROSE 1-5 GM/200ML-% IV SOLN
1000.0000 mg | Freq: Once | INTRAVENOUS | Status: AC
  Administered 2017-06-13: 1000 mg via INTRAVENOUS
  Filled 2017-06-13: qty 200

## 2017-06-13 MED ORDER — PROMETHAZINE HCL 25 MG/ML IJ SOLN: 6.2500 mg | INTRAMUSCULAR | Status: DC | PRN

## 2017-06-13 MED ORDER — LACTATED RINGERS IV SOLN
INTRAVENOUS | Status: DC
  Administered 2017-06-13 (×2): via INTRAVENOUS

## 2017-06-13 MED ORDER — DARUNAVIR-COBICISTAT 800-150 MG PO TABS
1.0000 | ORAL_TABLET | Freq: Every day | ORAL | Status: DC
  Administered 2017-06-14 – 2017-06-17 (×4): 1 via ORAL
  Filled 2017-06-13 (×4): qty 1

## 2017-06-13 MED ORDER — ONDANSETRON HCL 4 MG PO TABS: 4.0000 mg | ORAL_TABLET | Freq: Four times a day (QID) | ORAL | Status: DC | PRN

## 2017-06-13 MED ORDER — METHOCARBAMOL 500 MG PO TABS
500.0000 mg | ORAL_TABLET | Freq: Four times a day (QID) | ORAL | Status: DC | PRN
  Administered 2017-06-13 – 2017-06-17 (×8): 500 mg via ORAL
  Filled 2017-06-13 (×8): qty 1

## 2017-06-13 MED ORDER — MAGNESIUM CITRATE PO SOLN: 1.0000 | Freq: Once | ORAL | Status: DC | PRN

## 2017-06-13 MED ORDER — CEFAZOLIN SODIUM-DEXTROSE 2-4 GM/100ML-% IV SOLN
2.0000 g | Freq: Four times a day (QID) | INTRAVENOUS | Status: AC
  Administered 2017-06-13 – 2017-06-14 (×3): 2 g via INTRAVENOUS
  Filled 2017-06-13 (×3): qty 100

## 2017-06-13 MED ORDER — ACETAMINOPHEN 325 MG PO TABS
325.0000 mg | ORAL_TABLET | Freq: Four times a day (QID) | ORAL | Status: DC | PRN
  Administered 2017-06-13: 650 mg via ORAL
  Administered 2017-06-14: 325 mg via ORAL
  Administered 2017-06-14 – 2017-06-15 (×3): 650 mg via ORAL
  Filled 2017-06-13 (×5): qty 2

## 2017-06-13 MED ORDER — INSULIN ASPART 100 UNIT/ML ~~LOC~~ SOLN
0.0000 [IU] | Freq: Three times a day (TID) | SUBCUTANEOUS | Status: DC
  Administered 2017-06-14 – 2017-06-15 (×2): 2 [IU] via SUBCUTANEOUS

## 2017-06-13 MED ORDER — HYDROMORPHONE HCL 1 MG/ML IJ SOLN
INTRAMUSCULAR | Status: AC
  Administered 2017-06-13: 0.5 mg via INTRAVENOUS
  Filled 2017-06-13: qty 1

## 2017-06-13 MED ORDER — LIDOCAINE 2% (20 MG/ML) 5 ML SYRINGE
INTRAMUSCULAR | Status: DC | PRN
  Administered 2017-06-13: 80 mg via INTRAVENOUS

## 2017-06-13 MED ORDER — MIDAZOLAM HCL 5 MG/5ML IJ SOLN
INTRAMUSCULAR | Status: DC | PRN
  Administered 2017-06-13: 2 mg via INTRAVENOUS

## 2017-06-13 MED ORDER — GLIMEPIRIDE 1 MG PO TABS
1.0000 mg | ORAL_TABLET | ORAL | Status: DC
  Administered 2017-06-14 – 2017-06-17 (×4): 1 mg via ORAL
  Filled 2017-06-13 (×5): qty 1

## 2017-06-13 MED ORDER — MIDAZOLAM HCL 2 MG/2ML IJ SOLN
INTRAMUSCULAR | Status: AC
  Filled 2017-06-13: qty 2

## 2017-06-13 MED ORDER — DAPSONE 100 MG PO TABS
100.0000 mg | ORAL_TABLET | Freq: Every day | ORAL | Status: DC
  Administered 2017-06-14 – 2017-06-17 (×4): 100 mg via ORAL
  Filled 2017-06-13 (×4): qty 1

## 2017-06-13 MED ORDER — OXYCODONE HCL 5 MG PO TABS
5.0000 mg | ORAL_TABLET | ORAL | Status: DC | PRN
  Administered 2017-06-13 – 2017-06-16 (×3): 10 mg via ORAL
  Filled 2017-06-13 (×7): qty 2

## 2017-06-13 MED ORDER — METOCLOPRAMIDE HCL 5 MG PO TABS: 5.0000 mg | ORAL_TABLET | Freq: Three times a day (TID) | ORAL | Status: DC | PRN

## 2017-06-13 MED ORDER — 0.9 % SODIUM CHLORIDE (POUR BTL) OPTIME
TOPICAL | Status: DC | PRN
  Administered 2017-06-13: 1000 mL

## 2017-06-13 MED ORDER — POLYETHYLENE GLYCOL 3350 17 G PO PACK
17.0000 g | PACK | Freq: Every day | ORAL | Status: DC | PRN
  Filled 2017-06-13: qty 1

## 2017-06-13 MED ORDER — FENTANYL CITRATE (PF) 250 MCG/5ML IJ SOLN
INTRAMUSCULAR | Status: AC
  Filled 2017-06-13: qty 5

## 2017-06-13 MED ORDER — METHOCARBAMOL 1000 MG/10ML IJ SOLN: 500.0000 mg | Freq: Four times a day (QID) | INTRAVENOUS | Status: DC | PRN

## 2017-06-13 MED ORDER — OXYCODONE HCL 5 MG PO TABS
10.0000 mg | ORAL_TABLET | ORAL | Status: DC | PRN
  Administered 2017-06-14 – 2017-06-16 (×5): 15 mg via ORAL
  Administered 2017-06-16 – 2017-06-17 (×3): 10 mg via ORAL
  Filled 2017-06-13 (×5): qty 3

## 2017-06-13 MED ORDER — CHLORHEXIDINE GLUCONATE 4 % EX LIQD: 60.0000 mL | Freq: Once | CUTANEOUS | Status: DC

## 2017-06-13 MED ORDER — ABACAVIR-DOLUTEGRAVIR-LAMIVUD 600-50-300 MG PO TABS
1.0000 | ORAL_TABLET | Freq: Every day | ORAL | Status: DC
  Administered 2017-06-14 – 2017-06-17 (×4): 1 via ORAL
  Filled 2017-06-13 (×4): qty 1

## 2017-06-13 MED ORDER — ONDANSETRON HCL 4 MG/2ML IJ SOLN: 4.0000 mg | Freq: Four times a day (QID) | INTRAMUSCULAR | Status: DC | PRN

## 2017-06-13 MED ORDER — CEFAZOLIN SODIUM-DEXTROSE 2-4 GM/100ML-% IV SOLN
2.0000 g | INTRAVENOUS | Status: AC
  Administered 2017-06-13: 2 g via INTRAVENOUS
  Filled 2017-06-13: qty 100

## 2017-06-13 MED ORDER — PROPOFOL 10 MG/ML IV BOLUS
INTRAVENOUS | Status: DC | PRN
  Administered 2017-06-13: 270 mg via INTRAVENOUS

## 2017-06-13 MED ORDER — LISINOPRIL 5 MG PO TABS
5.0000 mg | ORAL_TABLET | Freq: Every day | ORAL | Status: DC
  Administered 2017-06-14 – 2017-06-17 (×4): 5 mg via ORAL
  Filled 2017-06-13 (×4): qty 1

## 2017-06-13 MED ORDER — HYDROMORPHONE HCL 1 MG/ML IJ SOLN
0.5000 mg | INTRAMUSCULAR | Status: DC | PRN
  Administered 2017-06-13 (×2): 1 mg via INTRAVENOUS
  Filled 2017-06-13 (×2): qty 1

## 2017-06-13 MED ORDER — AMLODIPINE BESYLATE 10 MG PO TABS
10.0000 mg | ORAL_TABLET | Freq: Every day | ORAL | Status: DC
  Administered 2017-06-14 – 2017-06-17 (×4): 10 mg via ORAL
  Filled 2017-06-13 (×4): qty 1

## 2017-06-13 MED ORDER — INSULIN ASPART 100 UNIT/ML ~~LOC~~ SOLN
4.0000 [IU] | Freq: Three times a day (TID) | SUBCUTANEOUS | Status: DC
  Administered 2017-06-14: 4 [IU] via SUBCUTANEOUS

## 2017-06-13 MED ORDER — PROPOFOL 10 MG/ML IV BOLUS
INTRAVENOUS | Status: AC
  Filled 2017-06-13: qty 20

## 2017-06-13 MED ORDER — ASPIRIN EC 325 MG PO TBEC
325.0000 mg | DELAYED_RELEASE_TABLET | Freq: Every day | ORAL | Status: DC
  Administered 2017-06-13 – 2017-06-17 (×5): 325 mg via ORAL
  Filled 2017-06-13 (×5): qty 1

## 2017-06-13 MED ORDER — ONDANSETRON HCL 4 MG/2ML IJ SOLN
INTRAMUSCULAR | Status: DC | PRN
Start: 2017-06-13 — End: 2017-06-13
  Administered 2017-06-13: 4 mg via INTRAVENOUS

## 2017-06-13 MED ORDER — SODIUM CHLORIDE 0.9 % IV SOLN
INTRAVENOUS | Status: DC
  Administered 2017-06-13: 23:00:00 via INTRAVENOUS

## 2017-06-13 MED ORDER — HYDROMORPHONE HCL 1 MG/ML IJ SOLN
0.2500 mg | INTRAMUSCULAR | Status: DC | PRN
  Administered 2017-06-13 (×4): 0.5 mg via INTRAVENOUS

## 2017-06-13 MED ORDER — DOCUSATE SODIUM 100 MG PO CAPS
100.0000 mg | ORAL_CAPSULE | Freq: Two times a day (BID) | ORAL | Status: DC
  Administered 2017-06-13 – 2017-06-17 (×7): 100 mg via ORAL
  Filled 2017-06-13 (×8): qty 1

## 2017-06-13 SURGICAL SUPPLY — 33 items
BLADE SAW RECIP 87.9 MT (BLADE) ×2 IMPLANT
BLADE SURG 21 STRL SS (BLADE) ×2 IMPLANT
BNDG COHESIVE 6X5 TAN STRL LF (GAUZE/BANDAGES/DRESSINGS) ×4 IMPLANT
BNDG GAUZE ELAST 4 BULKY (GAUZE/BANDAGES/DRESSINGS) ×4 IMPLANT
CANISTER SUCTION 1500CC (MISCELLANEOUS) ×1 IMPLANT
COVER SURGICAL LIGHT HANDLE (MISCELLANEOUS) ×2 IMPLANT
CUFF TOURNIQUET SINGLE 34IN LL (TOURNIQUET CUFF) IMPLANT
CUFF TOURNIQUET SINGLE 44IN (TOURNIQUET CUFF) IMPLANT
DRAPE INCISE IOBAN 66X45 STRL (DRAPES) IMPLANT
DRAPE U-SHAPE 47X51 STRL (DRAPES) ×2 IMPLANT
DRESSING PREVENA PLUS CUSTOM (GAUZE/BANDAGES/DRESSINGS) ×1 IMPLANT
DRSG PREVENA PLUS CUSTOM (GAUZE/BANDAGES/DRESSINGS) ×2
DRSG VAC ATS LRG SENSATRAC (GAUZE/BANDAGES/DRESSINGS) ×1 IMPLANT
ELECT REM PT RETURN 9FT ADLT (ELECTROSURGICAL) ×2
ELECTRODE REM PT RTRN 9FT ADLT (ELECTROSURGICAL) ×1 IMPLANT
GLOVE BIOGEL PI IND STRL 9 (GLOVE) ×1 IMPLANT
GLOVE BIOGEL PI INDICATOR 9 (GLOVE) ×1
GLOVE SURG ORTHO 9.0 STRL STRW (GLOVE) ×2 IMPLANT
GOWN STRL REUS W/ TWL XL LVL3 (GOWN DISPOSABLE) ×2 IMPLANT
GOWN STRL REUS W/TWL XL LVL3 (GOWN DISPOSABLE) ×4
KIT BASIN OR (CUSTOM PROCEDURE TRAY) ×2 IMPLANT
KIT ROOM TURNOVER OR (KITS) ×2 IMPLANT
MANIFOLD NEPTUNE II (INSTRUMENTS) ×2 IMPLANT
NS IRRIG 1000ML POUR BTL (IV SOLUTION) ×2 IMPLANT
PACK ORTHO EXTREMITY (CUSTOM PROCEDURE TRAY) ×2 IMPLANT
PAD ARMBOARD 7.5X6 YLW CONV (MISCELLANEOUS) ×2 IMPLANT
SPONGE LAP 18X18 X RAY DECT (DISPOSABLE) IMPLANT
STAPLER VISISTAT 35W (STAPLE) IMPLANT
STOCKINETTE IMPERVIOUS LG (DRAPES) ×2 IMPLANT
SUT SILK 2 0 (SUTURE) ×2
SUT SILK 2-0 18XBRD TIE 12 (SUTURE) ×1 IMPLANT
SUT VIC AB 1 CTX 27 (SUTURE) IMPLANT
TOWEL OR 17X26 10 PK STRL BLUE (TOWEL DISPOSABLE) ×2 IMPLANT

## 2017-06-13 NOTE — Anesthesia Preprocedure Evaluation (Signed)
Anesthesia Evaluation  Patient identified by MRN, date of birth, ID band Patient awake    Reviewed: Allergy & Precautions, H&P , NPO status , Patient's Chart, lab work & pertinent test results  Airway Mallampati: III  TM Distance: >3 FB Neck ROM: Full    Dental no notable dental hx. (+) Edentulous Upper, Edentulous Lower, Dental Advisory Given   Pulmonary neg pulmonary ROS,    Pulmonary exam normal breath sounds clear to auscultation       Cardiovascular hypertension, Pt. on medications  Rhythm:Regular Rate:Normal  Study Conclusions  - Left ventricle: The cavity size was normal. Wall thickness was   normal. Systolic function was normal. The estimated ejection   fraction was in the range of 60% to 65%. Wall motion was normal;   there were no regional wall motion abnormalities.   Neuro/Psych negative neurological ROS  negative psych ROS   GI/Hepatic negative GI ROS, Neg liver ROS,   Endo/Other  diabetes, Poorly Controlled, Insulin DependentMorbid obesity  Renal/GU Renal Insufficiency  negative genitourinary   Musculoskeletal  (+) Arthritis , Osteoarthritis,    Abdominal   Peds  Hematology  (+) HIV,   Anesthesia Other Findings   Reproductive/Obstetrics negative OB ROS                             Anesthesia Physical  Anesthesia Plan  ASA: III  Anesthesia Plan: General   Post-op Pain Management:    Induction: Intravenous  PONV Risk Score and Plan: 3 and Ondansetron and Midazolam  Airway Management Planned: LMA  Additional Equipment:   Intra-op Plan:   Post-operative Plan: Extubation in OR  Informed Consent: I have reviewed the patients History and Physical, chart, labs and discussed the procedure including the risks, benefits and alternatives for the proposed anesthesia with the patient or authorized representative who has indicated his/her understanding and acceptance.    Dental advisory given  Plan Discussed with: CRNA, Anesthesiologist and Surgeon  Anesthesia Plan Comments:         Anesthesia Quick Evaluation

## 2017-06-13 NOTE — Op Note (Signed)
   Date of Surgery: 06/13/2017  INDICATIONS: Mr. Rodda is a 52 y.o.-year-old male who is status post a transmetatarsal amputation on the right.  Patient was full weightbearing and had complete dehiscence of the wound with necrosis and exposed bone.  Patient has insufficient soft tissue for further foot salvage intervention.Marland Kitchen  PREOPERATIVE DIAGNOSIS: Dehiscence transmetatarsal amputation on the right  POSTOPERATIVE DIAGNOSIS: Same.  PROCEDURE: Transtibial amputation Application of Prevena wound VAC  SURGEON: Sharol Given, M.D.  ANESTHESIA:  general  IV FLUIDS AND URINE: See anesthesia.  ESTIMATED BLOOD LOSS: Minimal mL.  COMPLICATIONS: None.  DESCRIPTION OF PROCEDURE: The patient was brought to the operating room and underwent a general anesthetic. After adequate levels of anesthesia were obtained patient's lower extremity was prepped using DuraPrep draped into a sterile field. A timeout was called. The foot was draped out of the sterile field with impervious stockinette. A transverse incision was made 11 cm distal to the tibial tubercle. This curved proximally and a large posterior flap was created. The tibia was transected 1 cm proximal to the skin incision. The fibula was transected just proximal to the tibial incision. The tibia was beveled anteriorly. A large posterior flap was created. The sciatic nerve was pulled cut and allowed to retract. The vascular bundles were suture ligated with 2-0 silk. The deep and superficial fascial layers were closed using #1 Vicryl. The skin was closed using staples and 2-0 nylon. The wound was covered with a Prevena wound VAC. There was a good suction fit. A prosthetic shrinker was applied. Patient was extubated taken to the PACU in stable condition.   DISCHARGE PLANNING:  Antibiotic duration: 24 hours postoperatively  Weightbearing: Nonweightbearing on the right  Pain medication: As prescribed  Dressing care/ Wound VAC: Wound VAC for 1  week  Discharge to: Skilled nursing facility  Follow-up: In the office 1 week post operative.  Meridee Score, MD [place] 3:44 PM

## 2017-06-13 NOTE — Anesthesia Procedure Notes (Signed)
Procedure Name: LMA Insertion Date/Time: 06/13/2017 3:06 PM Performed by: Valda Favia, CRNA Pre-anesthesia Checklist: Patient identified, Emergency Drugs available, Suction available and Patient being monitored Patient Re-evaluated:Patient Re-evaluated prior to induction Oxygen Delivery Method: Circle System Utilized Preoxygenation: Pre-oxygenation with 100% oxygen Induction Type: IV induction Ventilation: Mask ventilation without difficulty LMA: LMA inserted LMA Size: 5.0 Number of attempts: 1 Airway Equipment and Method: Bite block Placement Confirmation: positive ETCO2 and breath sounds checked- equal and bilateral Tube secured with: Tape Dental Injury: Teeth and Oropharynx as per pre-operative assessment

## 2017-06-13 NOTE — Transfer of Care (Signed)
Immediate Anesthesia Transfer of Care Note  Patient: Keith [name]  Procedure(s) Performed: RIGHT BELOW KNEE AMPUTATION (Right )  Patient Location: PACU  Anesthesia Type:General  Level of Consciousness: awake and alert   Airway & Oxygen Therapy: Patient Spontanous Breathing and Patient connected to nasal cannula oxygen  Post-op Assessment: Report given to RN and Post -op Vital signs reviewed and stable  Post vital signs: Reviewed and stable  Last Vitals:  Vitals:   06/13/17 1343 06/13/17 1557  BP: (!) 137/97 122/88  Pulse: 88 84  Resp: 20 14  Temp: 36.9 C   SpO2: 100% 100%    Last Pain:  Vitals:   06/13/17 1559  PainSc: 10-Worst pain ever      Patients Stated Pain Goal: 3 (98/72/15 8727)  Complications: No apparent anesthesia complications
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
## 2017-06-14 LAB — GLUCOSE, CAPILLARY
Glucose-Capillary: 137 mg/dL — ABNORMAL HIGH (ref 65–99)
Glucose-Capillary: 91 mg/dL (ref 65–99)
Glucose-Capillary: 118 mg/dL — ABNORMAL HIGH (ref 65–99)
Glucose-Capillary: 97 mg/dL (ref 65–99)

## 2017-06-14 LAB — HEMOGLOBIN A1C
Hgb A1c MFr Bld: 7.4 % — ABNORMAL HIGH (ref 4.8–5.6)
Mean Plasma Glucose: 166 mg/dL

## 2017-06-14 NOTE — Progress Notes (Signed)
Subjective: 1 Day Post-Op Procedure(s) (LRB): RIGHT BELOW KNEE AMPUTATION (Right) Patient reports pain as moderate.  Sleepy but awakens easily. No complaints otherwise.   Objective: Vital signs in last 24 hours: Temp:  [97.2 F (36.2 C)-99.3 F (37.4 C)] 99.3 F (37.4 C) (03/09 0510) Pulse Rate:  [82-101] 101 (03/09 0510) Resp:  [14-22] 22 (03/08 1645) BP: (122-166)/(87-120) 156/106 (03/09 0510) SpO2:  [98 %-100 %] 100 % (03/09 0510) Weight:  [243 lb (110.2 kg)] 243 lb (110.2 kg) (03/08 1333)  Intake/Output from previous day: 03/08 0701 - 03/09 0700 In: 700 [I.V.:700] Out: 850 [Urine:750; Drains:25; Blood:75] Intake/Output this shift: Total I/O In: -  Out: 50 [Drains:50]  Recent Labs    06/13/17 1319  HGB 9.7*   Recent Labs    06/13/17 1319  WBC 7.0  RBC 3.64*  HCT 30.6*  PLT 337   Recent Labs    06/13/17 1319  NA 137  K 4.0  CL 103  CO2 24  BUN 18  CREATININE 1.43*  GLUCOSE 164*  CALCIUM 9.4   No results for input(s): LABPT, INR in the last 72 hours.  Right lower leg: Compartment soft Wound Vac in place right lower leg.   Assessment/Plan: 1 Day Post-Op Procedure(s) (LRB): RIGHT BELOW KNEE AMPUTATION (Right) Up with therapy  Monitor for any symptoms of anemia   Keith [name] 06/14/2017, 12:49 PM

## 2017-06-14 NOTE — Plan of Care (Signed)
  Education: Knowledge of General Education information will improve 06/14/2017 0507 - Progressing by Anson Fret, RN Note POC reviewed with pt.; also talked with pt. about calming down- pt. talking with someone on the phone and agitated; explained that he needs to rest and heal.

## 2017-06-14 NOTE — Evaluation (Signed)
Physical Therapy Evaluation Patient Details Name: Keith [name] MRN: [id number] DOB: 02-Mar-1966 Today's Date: 06/14/2017   History of Present Illness  52 y.o. male s/p R BKA 3/08. PMH includes: HTN, HIV, DM2, AIDS.  Clinical Impression  Patient is s/p above surgery resulting in functional limitations due to the deficits listed below (see PT Problem List). PTA, pt living with mother, independent with all mobility. Upon eval pt presents with fatigue, high levels of pain. Currently mod A for sit to stand transfer and unwilling to attempt walking this session due to pain and "having a lot going on in my head". Feel he is distressed over amputation and limiting his participation, pt asking to return to bed. Discussed importance of mobility and exercises to reduce contracture. Next PT visit will progress OOB mobility as tolerated. Per surgeon planning on d/cing to SNF, feel this is appropriate given presentation.  Patient will benefit from skilled PT to increase their independence and safety with mobility to allow discharge to the venue listed below.       Follow Up Recommendations Follow surgeon's recommendation for DC plan and follow-up therapies;SNF;Supervision/Assistance - 24 hour    Equipment Recommendations  Rolling walker with 5" wheels;3in1 (PT);Wheelchair (measurements PT)    Recommendations for Other Services OT consult     Precautions / Restrictions Precautions Precautions: Fall Restrictions Weight Bearing Restrictions: Yes RLE Weight Bearing: Non weight bearing      Mobility  Bed Mobility Overal bed mobility: Needs Assistance Bed Mobility: Supine to Sit     Supine to sit: Supervision        Transfers Overall transfer level: Needs assistance Equipment used: Rolling walker (2 wheeled) Transfers: Sit to/from Stand Sit to Stand: Mod assist;From elevated surface         General transfer comment: mod A from elevated surface to stand. cues for hand placement in RW  and safety. Patient able to tolerate standing for 3 mintues before sitting back down. unwilling to try steps today "i have too much going on in my head right now"   Ambulation/Gait                Stairs            Wheelchair Mobility    Modified Rankin (Stroke Patients Only)       Balance Overall balance assessment: Needs assistance   Sitting balance-Leahy Scale: Fair       Standing balance-Leahy Scale: Poor Standing balance comment: RW for balance                             Pertinent Vitals/Pain Pain Assessment: 0-10 Pain Score: 9  Pain Location: R leg Pain Descriptors / Indicators: Aching;Operative site guarding;Grimacing;Guarding Pain Intervention(s): Limited activity within patient's tolerance;Monitored during session;Premedicated before session;Repositioned    Home Living Family/patient expects to be discharged to:: Skilled nursing facility Living Arrangements: Parent               Additional Comments: now lives with mother    Prior Function Level of Independence: Independent         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, newspaper        Extremity/Trunk Assessment   Upper Extremity Assessment Upper Extremity Assessment: Overall WFL for tasks assessed    Lower Extremity Assessment Lower Extremity Assessment: Overall WFL for tasks assessed;RLE deficits/detail(pain and weakness consistent with above procedure)       Communication  Communication: No difficulties  Cognition Arousal/Alertness: Awake/alert Behavior During Therapy: WFL for tasks assessed/performed Overall Cognitive Status: Within Functional Limits for tasks assessed                                        General Comments General comments (skin integrity, edema, etc.): Discussion over contraction and exercises to reduce. encourgment of mobility and discussed negative effets of prolonged bed rest.    Exercises     Assessment/Plan     PT Assessment Patient needs continued PT services  PT Problem List Decreased strength;Decreased range of motion;Decreased activity tolerance;Decreased balance;Decreased knowledge of use of DME;Decreased coordination;Decreased mobility;Pain       PT Treatment Interventions DME instruction;Gait training;Stair training;Functional mobility training;Therapeutic activities;Therapeutic exercise;Balance training    PT Goals (Current goals can be found in the Care Plan section)  Acute Rehab PT Goals Patient Stated Goal: non stated PT Goal Formulation: With patient Time For Goal Achievement: 06/21/17 Potential to Achieve Goals: Good    Frequency Min 2X/week   Barriers to discharge        Co-evaluation               AM-PAC PT "6 Clicks" Daily Activity  Outcome Measure Difficulty turning over in bed (including adjusting bedclothes, sheets and blankets)?: A Little Difficulty moving from lying on back to sitting on the side of the bed? : A Little Difficulty sitting down on and standing up from a chair with arms (e.g., wheelchair, bedside commode, etc,.)?: A Little Help needed moving to and from a bed to chair (including a wheelchair)?: A Lot Help needed walking in hospital room?: A Lot Help needed climbing 3-5 steps with a railing? : Total 6 Click Score: 14    End of Session Equipment Utilized During Treatment: Gait belt Activity Tolerance: Patient limited by pain;Patient limited by fatigue Patient left: in bed;with call bell/phone within reach;with bed alarm set Nurse Communication: Mobility status PT Visit Diagnosis: Other abnormalities of gait and mobility (R26.89);Unsteadiness on feet (R26.81);Muscle weakness (generalized) (M62.81);Repeated falls (R29.6);Difficulty in walking, not elsewhere classified (R26.2);Pain Pain - Right/Left: Right Pain - part of body: Knee;Leg    Time: 1410-1440 PT Time Calculation (min) (ACUTE ONLY): 30 min   Charges:   PT Evaluation $PT Eval Low  Complexity: 1 Low PT Treatments $Therapeutic Activity: 8-22 mins   PT G Codes:       Reinaldo Berber, PT, DPT Acute Rehab Services Pager: [phone]    Reinaldo Berber 06/14/2017, 2:53 PM

## 2017-06-15 LAB — GLUCOSE, CAPILLARY
Glucose-Capillary: 94 mg/dL (ref 65–99)
Glucose-Capillary: 122 mg/dL — ABNORMAL HIGH (ref 65–99)
Glucose-Capillary: 128 mg/dL — ABNORMAL HIGH (ref 65–99)
Glucose-Capillary: 140 mg/dL — ABNORMAL HIGH (ref 65–99)
Glucose-Capillary: 93 mg/dL (ref 65–99)

## 2017-06-15 NOTE — Evaluation (Signed)
Occupational Therapy Evaluation Patient Details Name: Keith [name] MRN: [id number] DOB: Jun 11, 1965 Today's Date: 06/15/2017    History of Present Illness 52 y.o. male s/p R BKA 3/08. PMH includes: HTN, HIV, DM2, AIDS.   Clinical Impression   Pt reports he was independent with ADL PTA. Pt appears to be self limiting with functional activities; reports "Im weak" when asked to try to assist with tasks. Currently requires min assist for UB ADL and max assist for LB ADL. Pt required max assist for stand pivot transfer; pt with unsafe technique despite max cues. Recommending SNF for follow up to maximize independence and safety with ADL and functional mobility prior to return home. Pt would benefit from continued skilled OT to address established goals.    Follow Up Recommendations  SNF    Equipment Recommendations  Other (comment)(TBD at next venue)    Recommendations for Other Services       Precautions / Restrictions Precautions Precautions: Fall Restrictions Weight Bearing Restrictions: Yes RLE Weight Bearing: Non weight bearing      Mobility Bed Mobility Overal bed mobility: Needs Assistance Bed Mobility: Sit to Supine       Sit to supine: Min assist   General bed mobility comments: for controlled descent of trunk to bed. Pt with poor safety awareness and moves quickly  Transfers Overall transfer level: Needs assistance Equipment used: Rolling walker (2 wheeled) Transfers: Sit to/from [facility] Sit to Stand: Max assist Stand pivot transfers: Max assist       General transfer comment: Max assist to boost up and for balance with stand pivot. Cues for hand placement and technique. Pt with poor safety awareness and moves quickly    Balance Overall balance assessment: Needs assistance Sitting-balance support: Feet supported Sitting balance-Leahy Scale: Fair     Standing balance support: Bilateral upper extremity supported Standing  balance-Leahy Scale: Poor                             ADL either performed or assessed with clinical judgement   ADL Overall ADL's : Needs assistance/impaired Eating/Feeding: Set up;Sitting   Grooming: Set up;Supervision/safety;Sitting   Upper Body Bathing: Minimal assistance;Sitting   Lower Body Bathing: Maximal assistance;Sitting/lateral leans   Upper Body Dressing : Minimal assistance;Sitting   Lower Body Dressing: Maximal assistance;Sitting/lateral leans   Toilet Transfer: Maximal assistance;Stand-pivot;BSC;RW Toilet Transfer Details (indicate cue type and reason): Poor safety awareness; simulated by transfer chair>bed         Functional mobility during ADLs: Maximal assistance;Rolling walker(for stand pivot only) General ADL Comments: Increased assistance required due to limited effort with functional tasks     Vision         Perception     Praxis      Pertinent Vitals/Pain Pain Assessment: Faces Faces Pain Scale: Hurts whole lot Pain Location: R leg Pain Descriptors / Indicators: Aching;Grimacing;Sore Pain Intervention(s): Monitored during session;Limited activity within patient's tolerance;Repositioned     Hand Dominance     Extremity/Trunk Assessment Upper Extremity Assessment Upper Extremity Assessment: Overall WFL for tasks assessed   Lower Extremity Assessment Lower Extremity Assessment: Defer to PT evaluation       Communication Communication Communication: No difficulties   Cognition Arousal/Alertness: Awake/alert Behavior During Therapy: WFL for tasks assessed/performed Overall Cognitive Status: Within Functional Limits for tasks assessed  General Comments: Limited effort during functional tasks, reports "Im weak" when asked to participate in anything   General Comments       Exercises     Shoulder Instructions      Home Living Family/patient expects to be discharged to::  Unsure Living Arrangements: Parent                                      Prior Functioning/Environment Level of Independence: Independent        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 Problem List: Decreased activity tolerance;Impaired balance (sitting and/or standing);Decreased safety awareness;Decreased knowledge of use of DME or AE;Decreased knowledge of precautions;Obesity;Pain;Increased edema      OT Treatment/Interventions: Self-care/ADL training;Therapeutic exercise;Energy conservation;DME and/or AE instruction;Therapeutic activities;Patient/family education;Balance training    OT Goals(Current goals can be found in the care plan section) Acute Rehab OT Goals Patient Stated Goal: get stronger OT Goal Formulation: With patient Time For Goal Achievement: 06/29/17 Potential to Achieve Goals: Good ADL Goals Pt Will Perform Lower Body Bathing: with min guard assist;sitting/lateral leans Pt Will Perform Lower Body Dressing: with min guard assist;sitting/lateral leans Pt Will Transfer to Toilet: with min guard assist;stand pivot transfer;bedside commode Pt Will Perform Toileting - Clothing Manipulation and hygiene: with min guard assist;sit to/from stand  OT Frequency: Min 2X/week   Barriers to D/C:            Co-evaluation              AM-PAC PT "6 Clicks" Daily Activity     Outcome Measure Help from another person eating meals?: None Help from another person taking care of personal grooming?: A Little Help from another person toileting, which includes using toliet, bedpan, or urinal?: A Lot Help from another person bathing (including washing, rinsing, drying)?: A Lot Help from another person to put on and taking off regular upper body clothing?: A Little Help from another person to put on and taking off regular lower body clothing?: A Lot 6 Click Score: 16   End of Session Equipment Utilized During Treatment: Rolling walker Nurse Communication:  Mobility status  Activity Tolerance: Patient limited by pain Patient left: in bed;with call bell/phone within reach;with nursing/sitter in room  OT Visit Diagnosis: Other abnormalities of gait and mobility (R26.89);Pain Pain - Right/Left: Right Pain - part of body: Leg                Time: 0950-1015 OT Time Calculation (min): 25 min Charges:  OT General Charges $OT Visit: 1 Visit OT Evaluation $OT Eval Moderate Complexity: 1 Mod OT Treatments $Therapeutic Activity: 8-22 mins G-Codes:     Okechukwu Regnier A. Ulice Brilliant, M.S., OTR/L Pager: [place] 06/15/2017, 10:51 AM

## 2017-06-15 NOTE — Progress Notes (Signed)
Subjective: 2 Days Post-Op Procedure(s) (LRB): RIGHT BELOW KNEE AMPUTATION (Right) Patient reports pain as moderate.    Objective: Vital signs in last 24 hours: Temp:  [98.3 F (36.8 C)-100.8 F (38.2 C)] 98.8 F (37.1 C) (03/10 0815) Pulse Rate:  [93-98] 97 (03/10 0815) Resp:  [17-20] 17 (03/10 0815) BP: (113-121)/(64-73) 121/64 (03/10 0815) SpO2:  [94 %-100 %] 100 % (03/10 0815)  Intake/Output from previous day: 03/09 0701 - 03/10 0700 In: 1173.2 [P.O.:960; I.V.:213.2] Out: 1050 [Urine:1000; Drains:50] Intake/Output this shift: No intake/output data recorded.  Recent Labs    06/13/17 1319  HGB 9.7*   Recent Labs    06/13/17 1319  WBC 7.0  RBC 3.64*  HCT 30.6*  PLT 337   Recent Labs    06/13/17 1319  NA 137  K 4.0  CL 103  CO2 24  BUN 18  CREATININE 1.43*  GLUCOSE 164*  CALCIUM 9.4   No results for input(s): LABPT, INR in the last 72 hours.  Incision: dressing C/D/I VAC with a good seal.   Assessment/Plan: 2 Days Post-Op Procedure(s) (LRB): RIGHT BELOW KNEE AMPUTATION (Right) Up with therapy Discharge to SNF early this week.  Mcarthur Rossetti 06/15/2017, 9:51 AM

## 2017-06-15 NOTE — Progress Notes (Signed)
Patient unable to give self bath, stated "felt weak and unable to clean self". RN retook blood sugar, 122. Recheck vital signs all WDL. Educated patient on the benefits of keeping self clean, infection prevention, mobility to prevent clots, and doing ADLs for self. Patient continued to stated "they were too weak". Will continue to monitor and educate patient.   During skin assessment RN noticed scabbed area on left side of left foot. Cleansed and applied foam dressing to keep area clean.

## 2017-06-15 NOTE — Clinical Social Work Note (Signed)
Clinical Social Work Assessment  Patient Details  Name: Keith [name] MRN: [id number] Date of Birth: 12/21/1965  Date of referral:  06/15/17               Reason for consult:  Facility Placement                Permission sought to share information with:  Family Supports Permission granted to share information::     Name::        Agency::     Relationship::     Contact Information:     Housing/Transportation Living arrangements for the past 2 months:  Single Family Home Source of Information:  Patient Patient Interpreter Needed:  None Criminal Activity/Legal Involvement Pertinent to Current Situation/Hospitalization:  No - Comment as needed Significant Relationships:  Parents Lives with:  Parents Do you feel safe going back to the place where you live?    Need for family participation in patient care:     Care giving concerns:  Pt is alert and oriented. Pt states he lives at home with his mom.    Social Worker assessment / plan:  CSW spoke with pt at bedside. Pt is agreeable to SNF at d/c. Pt was really sleepy. Pt was agreeable to [facility] f/o. Please follow up with b/o.  Employment status:  Unemployed Forensic scientist:  Medicare PT Recommendations:  [place] / Referral to community resources:  [place]  Patient/Family's Response to care:  Pt verbalized understanding of CSW role and expressed appreciation for support. Pt denies any concern regarding pt care at this time.   Patient/Family's Understanding of and Emotional Response to Diagnosis, Current Treatment, and Prognosis:  Pt understanding and realistic regarding physical limitations. Pt understands the need for SNF placement at d/c. Pt agreeable to SNF placement at d/c, at this time. Pt's responses emotionally appropriate during conversation with CSW. Pt denies any concern regarding treatment plan at this time. CSW will continue to provide support and facilitate  d/c needs.   Emotional Assessment Appearance:  Appears stated age Attitude/Demeanor/Rapport:  (Patient was appropriate.) Affect (typically observed):  Accepting, Appropriate, Calm Orientation:  Oriented to Self, Oriented to Place, Oriented to  Time, Oriented to Situation Alcohol / Substance use:  Not Applicable Psych involvement (Current and /or in the community):  No (Comment)  Discharge Needs  Concerns to be addressed:  Basic Needs, Care Coordination Readmission within the last 30 days:  No Current discharge risk:  Dependent with Mobility Barriers to Discharge:  Continued Medical Work up   [employer], LCSW 06/15/2017, 4:33 PM

## 2017-06-15 NOTE — NC FL2 (Signed)
[place] LEVEL OF CARE SCREENING TOOL     IDENTIFICATION  Patient Name: Keith [name] Birthdate: 12-07-1965 Sex: male Admission Date (Current Location): 06/13/2017  [facility] [place] Number:  Herbalist and Address:  [facility] [place], [address], [place], [place] [address]      Provider Number: [id number]  Attending Physician Name and Address:  [name], MD  Relative Name and Phone Number:       Current Level of Care: Hospital Recommended Level of Care: [place] Prior Approval Number:    Date Approved/Denied:   PASRR Number:  [id number]   Discharge Plan: SNF    Current Diagnoses: Patient Active Problem List   Diagnosis Date Noted  . Below knee amputation status, right ([place]) 06/13/2017  . S/P transmetatarsal amputation of foot, right ([place]) 05/07/2017  . Wound infection   . Bacteremia   . Fever   . Abscess of right foot   . Sepsis ([place]) 03/15/2017  . CKD (chronic kidney disease) stage 3, GFR 30-59 ml/min (HCC) 03/15/2017  . Diabetic polyneuropathy associated with type 2 diabetes mellitus ([place]) 01/23/2017  . Right foot ulcer, limited to breakdown of skin ([place]) 01/07/2017  . Dehydration 09/29/2016  . AKI (acute kidney injury) ([place]) 09/28/2016  . Diarrhea with dehydration 09/28/2016  . Nausea vomiting and diarrhea 09/28/2016  . Onychomycosis of multiple toenails with type 2 diabetes mellitus ([place]) 08/29/2015  . MRSA carrier 04/20/2014  . Testosterone deficiency 04/20/2014  . Genital warts 02/22/2014  . HIV disease ([place])   . Insulin-requiring or dependent type II diabetes mellitus ([place]) 02/04/2014  . Cough 05/04/2013  . Dental anomaly 11/20/2012  . Arthritis of right knee 02/23/2012  . Hyperlipidemia 11/10/2011  . Chronic pain 08/07/2011  . Meralgia paraesthetica 04/23/2011  . ERECTILE DYSFUNCTION 08/22/2008  . Essential hypertension 05/19/2008    Orientation RESPIRATION BLADDER Height  & Weight     Self, Time, Situation, Place  Normal Continent Weight: 243 lb (110.2 kg) Height:  5\' 9"  (175.3 cm)  BEHAVIORAL SYMPTOMS/MOOD NEUROLOGICAL BOWEL NUTRITION STATUS      Continent Diet(Carb modified, thin liquids)  AMBULATORY STATUS COMMUNICATION OF NEEDS Skin   Extensive Assist Verbally Surgical wounds, Wound Vac(Closed incision right leg. Wound vac; continous 125mmHg)                       Personal Care Assistance Level of Assistance  Bathing, Feeding, Dressing Bathing Assistance: Maximum assistance Feeding assistance: Independent Dressing Assistance: Maximum assistance     Functional Limitations Info  Sight, Hearing, Speech Sight Info: Adequate Hearing Info: Adequate Speech Info: Adequate    SPECIAL CARE FACTORS FREQUENCY  OT (By licensed OT), PT (By licensed PT)   Contact precautions: MRSA     PT Frequency: 2x OT Frequency: 2x            Contractures Contractures Info: Not present    Additional Factors Info  Code Status, Allergies Code Status Info: Full Code Allergies Info: Sulfa Antibiotics           Current Medications (06/15/2017):  This is the current hospital active medication list Current Facility-Administered Medications  Medication Dose Route Frequency Provider Last Rate Last Dose  . 0.9 %  sodium chloride infusion   Intravenous Continuous [name], MD 10 mL/hr at 06/13/17 2231    . abacavir-dolutegravir-lamiVUDine (TRIUMEQ) 600-50-300 MG per tablet 1 tablet  1 tablet Oral Daily [name], MD  1 tablet at 06/15/17 0818  . acetaminophen (TYLENOL) tablet 325-650 mg  325-650 mg Oral Q6H PRN [name], MD   650 mg at 06/15/17 0817  . amLODipine (NORVASC) tablet 10 mg  10 mg Oral Daily [name], MD   10 mg at 06/15/17 0819  . aspirin EC tablet 325 mg  325 mg Oral Daily [name], MD   325 mg at 06/15/17 0815  . bisacodyl (DULCOLAX) suppository 10 mg  10 mg Rectal Daily PRN [name], MD      . dapsone tablet  100 mg  100 mg Oral Daily [name], MD   100 mg at 06/15/17 0818  . darunavir-cobicistat (PREZCOBIX) 800-150 MG per tablet 1 tablet  1 tablet Oral Daily [name], MD   1 tablet at 06/15/17 [phone]  . docusate sodium (COLACE) capsule 100 mg  100 mg Oral BID [name], MD   100 mg at 06/15/17 0819  . gabapentin (NEURONTIN) capsule 300 mg  300 mg Oral TID [name], MD   300 mg at 06/15/17 0815  . glimepiride (AMARYL) tablet 1 mg  1 mg Oral Virginia Rochester, MD   1 mg at 06/15/17 0815  . HYDROmorphone (DILAUDID) injection 0.5-1 mg  0.5-1 mg Intravenous Q4H PRN [name], MD   1 mg at 06/13/17 2228  . insulin aspart (novoLOG) injection 0-15 Units  0-15 Units Subcutaneous TID WC [name], MD   2 Units at 06/14/17 [phone]  . insulin aspart (novoLOG) injection 4 Units  4 Units Subcutaneous TID WC [name], MD   4 Units at 06/14/17 [phone]  . insulin glargine (LANTUS) injection 15 Units  15 Units Subcutaneous QHS [name], MD   15 Units at 06/14/17 2107  . lisinopril (PRINIVIL,ZESTRIL) tablet 5 mg  5 mg Oral Daily [name], MD   5 mg at 06/15/17 0815  . magnesium citrate solution 1 Bottle  1 Bottle Oral Once PRN [name], MD      . methocarbamol (ROBAXIN) tablet 500 mg  500 mg Oral Q6H PRN [name], MD   500 mg at 06/15/17 0815   Or  . methocarbamol (ROBAXIN) 500 mg in dextrose 5 % 50 mL IVPB  500 mg Intravenous Q6H PRN [name], MD      . metoCLOPramide (REGLAN) tablet 5-10 mg  5-10 mg Oral Q8H PRN [name], MD       Or  . metoCLOPramide (REGLAN) injection 5-10 mg  5-10 mg Intravenous Q8H PRN [name], MD      . ondansetron [facility]) tablet 4 mg  4 mg Oral Q6H PRN [name], MD       Or  . ondansetron [facility]) injection 4 mg  4 mg Intravenous Q6H PRN [name], MD      . oxyCODONE (Oxy IR/ROXICODONE) immediate release tablet 10-15 mg  10-15 mg Oral Q4H PRN [name], MD   15 mg at 06/14/17 1809  . oxyCODONE (Oxy  IR/ROXICODONE) immediate release tablet 5-10 mg  5-10 mg Oral Q4H PRN [name], MD   10 mg at 06/15/17 0817  . polyethylene glycol (MIRALAX / GLYCOLAX) packet 17 g  17 g Oral Daily PRN [name], MD         Discharge Medications: Please see discharge summary for a list of discharge medications.  Relevant Imaging Results:  Relevant Lab Results:   Additional Information SSN: [id number]    Eileen Stanford, LCSW

## 2017-06-16 ENCOUNTER — Encounter (HOSPITAL_COMMUNITY): Payer: Self-pay | Admitting: Orthopedic Surgery

## 2017-06-16 ENCOUNTER — Ambulatory Visit (INDEPENDENT_AMBULATORY_CARE_PROVIDER_SITE_OTHER): Payer: Medicare Other | Admitting: Orthopedic Surgery

## 2017-06-16 LAB — GLUCOSE, CAPILLARY
Glucose-Capillary: 84 mg/dL (ref 65–99)
Glucose-Capillary: 100 mg/dL — ABNORMAL HIGH (ref 65–99)
Glucose-Capillary: 97 mg/dL (ref 65–99)
Glucose-Capillary: 84 mg/dL (ref 65–99)

## 2017-06-16 NOTE — Progress Notes (Signed)
[name], male   DOB: [birth year], 52 y.o.   MRN: [id number] Postoperative day 3 right transtibial amputation.  Approximately 100 cc VAC canister.  Plan for discharge to skilled nursing possibly tomorrow or Tuesday.

## 2017-06-16 NOTE — Anesthesia Postprocedure Evaluation (Signed)
Anesthesia Post Note  Patient: Keith [name]  Procedure(s) Performed: RIGHT BELOW KNEE AMPUTATION (Right )     Patient location during evaluation: PACU Anesthesia Type: General Level of consciousness: sedated Pain management: pain level controlled Vital Signs Assessment: post-procedure vital signs reviewed and stable Respiratory status: spontaneous breathing and respiratory function stable Cardiovascular status: stable Postop Assessment: no apparent nausea or vomiting Anesthetic complications: no                 Karishma Unrein DANIEL

## 2017-06-17 DIAGNOSIS — R262 Difficulty in walking, not elsewhere classified: Secondary | ICD-10-CM | POA: Diagnosis present

## 2017-06-17 DIAGNOSIS — E119 Type 2 diabetes mellitus without complications: Secondary | ICD-10-CM | POA: Diagnosis not present

## 2017-06-17 DIAGNOSIS — Z89511 Acquired absence of right leg below knee: Secondary | ICD-10-CM | POA: Diagnosis not present

## 2017-06-17 DIAGNOSIS — M79604 Pain in right leg: Secondary | ICD-10-CM | POA: Diagnosis not present

## 2017-06-17 DIAGNOSIS — T148XXA Other injury of unspecified body region, initial encounter: Secondary | ICD-10-CM | POA: Diagnosis not present

## 2017-06-17 DIAGNOSIS — Y658 Other specified misadventures during surgical and medical care: Secondary | ICD-10-CM | POA: Diagnosis not present

## 2017-06-17 DIAGNOSIS — S91302S Unspecified open wound, left foot, sequela: Secondary | ICD-10-CM | POA: Diagnosis not present

## 2017-06-17 DIAGNOSIS — D631 Anemia in chronic kidney disease: Secondary | ICD-10-CM | POA: Diagnosis not present

## 2017-06-17 DIAGNOSIS — T8131XA Disruption of external operation (surgical) wound, not elsewhere classified, initial encounter: Secondary | ICD-10-CM | POA: Diagnosis not present

## 2017-06-17 DIAGNOSIS — G8911 Acute pain due to trauma: Secondary | ICD-10-CM | POA: Diagnosis not present

## 2017-06-17 DIAGNOSIS — I129 Hypertensive chronic kidney disease with stage 1 through stage 4 chronic kidney disease, or unspecified chronic kidney disease: Secondary | ICD-10-CM | POA: Diagnosis not present

## 2017-06-17 DIAGNOSIS — M545 Low back pain: Secondary | ICD-10-CM | POA: Diagnosis not present

## 2017-06-17 DIAGNOSIS — N183 Chronic kidney disease, stage 3 (moderate): Secondary | ICD-10-CM | POA: Diagnosis not present

## 2017-06-17 DIAGNOSIS — R52 Pain, unspecified: Secondary | ICD-10-CM | POA: Diagnosis not present

## 2017-06-17 DIAGNOSIS — F329 Major depressive disorder, single episode, unspecified: Secondary | ICD-10-CM | POA: Diagnosis not present

## 2017-06-17 DIAGNOSIS — Z79891 Long term (current) use of opiate analgesic: Secondary | ICD-10-CM | POA: Diagnosis not present

## 2017-06-17 DIAGNOSIS — E0851 Diabetes mellitus due to underlying condition with diabetic peripheral angiopathy without gangrene: Secondary | ICD-10-CM | POA: Diagnosis not present

## 2017-06-17 DIAGNOSIS — M6281 Muscle weakness (generalized): Secondary | ICD-10-CM | POA: Diagnosis present

## 2017-06-17 DIAGNOSIS — N182 Chronic kidney disease, stage 2 (mild): Secondary | ICD-10-CM | POA: Diagnosis not present

## 2017-06-17 DIAGNOSIS — B20 Human immunodeficiency virus [HIV] disease: Secondary | ICD-10-CM | POA: Diagnosis not present

## 2017-06-17 DIAGNOSIS — G894 Chronic pain syndrome: Secondary | ICD-10-CM | POA: Diagnosis not present

## 2017-06-17 DIAGNOSIS — T8130XA Disruption of wound, unspecified, initial encounter: Secondary | ICD-10-CM | POA: Diagnosis not present

## 2017-06-17 DIAGNOSIS — Z21 Asymptomatic human immunodeficiency virus [HIV] infection status: Secondary | ICD-10-CM | POA: Diagnosis not present

## 2017-06-17 DIAGNOSIS — E11621 Type 2 diabetes mellitus with foot ulcer: Secondary | ICD-10-CM | POA: Diagnosis not present

## 2017-06-17 DIAGNOSIS — R2689 Other abnormalities of gait and mobility: Secondary | ICD-10-CM | POA: Diagnosis not present

## 2017-06-17 DIAGNOSIS — Z794 Long term (current) use of insulin: Secondary | ICD-10-CM | POA: Diagnosis not present

## 2017-06-17 DIAGNOSIS — M79661 Pain in right lower leg: Secondary | ICD-10-CM | POA: Diagnosis not present

## 2017-06-17 DIAGNOSIS — E876 Hypokalemia: Secondary | ICD-10-CM | POA: Diagnosis present

## 2017-06-17 DIAGNOSIS — L8992 Pressure ulcer of unspecified site, stage 2: Secondary | ICD-10-CM | POA: Diagnosis not present

## 2017-06-17 DIAGNOSIS — Z79899 Other long term (current) drug therapy: Secondary | ICD-10-CM | POA: Diagnosis not present

## 2017-06-17 DIAGNOSIS — I10 Essential (primary) hypertension: Secondary | ICD-10-CM | POA: Diagnosis not present

## 2017-06-17 DIAGNOSIS — Z4889 Encounter for other specified surgical aftercare: Secondary | ICD-10-CM | POA: Diagnosis not present

## 2017-06-17 LAB — GLUCOSE, CAPILLARY
Glucose-Capillary: 72 mg/dL (ref 65–99)
Glucose-Capillary: 103 mg/dL — ABNORMAL HIGH (ref 65–99)
Glucose-Capillary: 95 mg/dL (ref 65–99)
Glucose-Capillary: 88 mg/dL (ref 65–99)

## 2017-06-17 MED ORDER — GABAPENTIN 300 MG PO CAPS: 300.0000 mg | ORAL_CAPSULE | Freq: Three times a day (TID) | ORAL | 0 refills | Status: DC

## 2017-06-17 MED ORDER — OXYCODONE HCL 10 MG PO TABS: 10.0000 mg | ORAL_TABLET | Freq: Three times a day (TID) | ORAL | 0 refills | Status: DC | PRN

## 2017-06-17 NOTE — Care Management Important Message (Signed)
Important Message  Patient Details  Name: Keith [name] MRN: [id number] Date of Birth: 1965/12/20   Medicare Important Message Given:  Yes    Carles Collet, RN 06/17/2017, 3:10 PM

## 2017-06-17 NOTE — Clinical Social Work Note (Addendum)
CSW met with patient at bedside.  Pt from home with family. Pt has a new impairment and will need SNF. Pt has experience with SNF and was at [facility] about 2 months ago.  CSW explained her role and SNF process/placement. Pt indicated that he would be amenable to returning to [facility] for short term rehab.   F/u with SNF as patient discharging today.  CSW will f/u.  2:18pm: CSW f/u with SNF[facility] and they indicated that they are still reviewing patient's records and will let CSW know shortly.  CSW will continue to follow up as patient discharging today.  Elissa Hefty, LCSW Clinical Social Worker [phone]

## 2017-06-17 NOTE — Progress Notes (Addendum)
Physical Therapy Treatment Patient Details Name: Keith [name] MRN: [id number] DOB: 1966/04/04 Today's Date: 06/17/2017    History of Present Illness 52 y.o. male s/p R BKA 3/08. PMH includes: HTN, HIV, DM2, AIDS.    PT Comments    Patient is making gradual progress toward mobility goals. Pt required +2 assist for functional transfers. Pt with BP of 93/53 (62), SpO2 96%, and pulse 88 bpm in sitting. Continue to progress as tolerated with anticipated d/c to SNF for further skilled PT services.     Follow Up Recommendations  Follow surgeon's recommendation for DC plan and follow-up therapies;SNF;Supervision/Assistance - 24 hour     Equipment Recommendations  Rolling walker with 5" wheels;3in1 (PT);Wheelchair (measurements PT)    Recommendations for Other Services OT consult     Precautions / Restrictions Precautions Precautions: Fall Restrictions Weight Bearing Restrictions: Yes RLE Weight Bearing: Non weight bearing    Mobility  Bed Mobility Overal bed mobility: Needs Assistance Bed Mobility: Supine to Sit     Supine to sit: Mod assist;HOB elevated;+2 for safety/equipment     General bed mobility comments: cues for sequencing and technique; use of rail and HOB elevated  Transfers Overall transfer level: Needs assistance Equipment used: Rolling walker (2 wheeled) Transfers: Sit to/from [facility] Sit to Stand: Mod assist;+2 physical assistance Stand pivot transfers: Mod assist;+2 physical assistance       General transfer comment: assist to power up into standing and to maintain balance and manage RW when pivoting; cues for safe hand placement and multimodal cues for upright posture as pt maintain flexed posture throughout transfer  Ambulation/Gait             General Gait Details: unable at this time   Stairs            Wheelchair Mobility    Modified Rankin (Stroke Patients Only)       Balance Overall balance assessment:  Needs assistance Sitting-balance support: Feet supported Sitting balance-Leahy Scale: Fair     Standing balance support: Bilateral upper extremity supported Standing balance-Leahy Scale: Poor Standing balance comment: RW for balance                            Cognition Arousal/Alertness: Awake/alert(pt kept eyes closed alot during session but not sleeping) Behavior During Therapy: WFL for tasks assessed/performed Overall Cognitive Status: Within Functional Limits for tasks assessed                                        Exercises      General Comments General comments (skin integrity, edema, etc.): pt educated on positioning of R residual limb for optimal ROM and healing      Pertinent Vitals/Pain Pain Assessment: Faces Faces Pain Scale: Hurts little more Pain Location: R leg Pain Descriptors / Indicators: Sore;Guarding Pain Intervention(s): Limited activity within patient's tolerance;Monitored during session;Premedicated before session;Repositioned    Home Living                      Prior Function            PT Goals (current goals can now be found in the care plan section) Acute Rehab PT Goals Patient Stated Goal: get stronger PT Goal Formulation: With patient Time For Goal Achievement: 06/21/17 Potential to Achieve Goals: Good Progress towards PT goals:  Progressing toward goals    Frequency    Min 2X/week      PT Plan Current plan remains appropriate    Co-evaluation              AM-PAC PT "6 Clicks" Daily Activity  Outcome Measure  Difficulty turning over in bed (including adjusting bedclothes, sheets and blankets)?: A Little Difficulty moving from lying on back to sitting on the side of the bed? : Unable Difficulty sitting down on and standing up from a chair with arms (e.g., wheelchair, bedside commode, etc,.)?: Unable Help needed moving to and from a bed to chair (including a wheelchair)?: A Lot Help  needed walking in hospital room?: Total Help needed climbing 3-5 steps with a railing? : Total 6 Click Score: 9    End of Session Equipment Utilized During Treatment: Gait belt Activity Tolerance: Patient limited by fatigue Patient left: with call bell/phone within reach;in chair;with chair alarm set;with nursing/sitter in room Nurse Communication: Mobility status PT Visit Diagnosis: Other abnormalities of gait and mobility (R26.89);Unsteadiness on feet (R26.81);Muscle weakness (generalized) (M62.81);Repeated falls (R29.6);Difficulty in walking, not elsewhere classified (R26.2);Pain Pain - Right/Left: Right Pain - part of body: Knee;Leg     Time: 3437-3578 PT Time Calculation (min) (ACUTE ONLY): 35 min  Charges:  $Therapeutic Activity: 23-37 mins                    G Codes:       Earney Navy, PTA Pager: [phone]     Darliss Cheney 06/17/2017, 3:49 PM

## 2017-06-17 NOTE — Clinical Social Work Placement (Signed)
   CLINICAL SOCIAL WORK PLACEMENT  NOTE  Date:  06/17/2017  Patient Details  Name: Keith [name] MRN: [id number] Date of Birth: May 17, 1965  Clinical Social Work is seeking post-discharge placement for this patient at the [place] level of care (*CSW will initial, date and re-position this form in  chart as items are completed):  Yes   Patient/family provided with [facility] Work Department's list of facilities offering this level of care within the geographic area requested by the patient (or if unable, by the patient's family).  Yes   Patient/family informed of their freedom to choose among providers that offer the needed level of care, that participate in Medicare, Medicaid or managed care program needed by the patient, have an available bed and are willing to accept the patient.  Yes   Patient/family informed of [facility]'s ownership interest in [facility] and [facility], as well as of the fact that they are under no obligation to receive care at these facilities.  PASRR submitted to EDS on       PASRR number received on       Existing PASRR number confirmed on 06/17/17     FL2 transmitted to all facilities in geographic area requested by pt/family on       FL2 transmitted to all facilities within larger geographic area on 06/17/17     Patient informed that his/her managed care company has contracts with or will negotiate with certain facilities, including the following:        Yes   Patient/family informed of bed offers received.  Patient chooses bed at [facility]     Physician recommends and patient chooses bed at      Patient to be transferred to [facility] on 06/17/17.  Patient to be transferred to facility by [facility]     Patient family notified on 06/17/17 of transfer.  Name of family member notified:  pt responsible for self     PHYSICIAN        Additional Comment:    _______________________________________________ Normajean Baxter, LCSW 06/17/2017, 2:56 PM

## 2017-06-17 NOTE — Progress Notes (Signed)
Pt's wound vac taken off and guaze with ace wrap applied.   RN attempted to call report to [facility] but phone number was not working, [employer] informed and CW gave RN's number to facility and facility attempted to reach RN once but did not leave message and did not try to call again. RN gave number to [facility] and asked them to give to staff at [facility] and they could call if they had any questions about pt or needed more information. Facility did not call RN for a report.

## 2017-06-17 NOTE — Progress Notes (Signed)
[name] in and informed of pt.'s cbg last night and this am.

## 2017-06-17 NOTE — Discharge Summary (Signed)
Discharge Diagnoses:  Active Problems:   Below knee amputation status, right [facility])   Surgeries: Procedure(s): RIGHT BELOW KNEE AMPUTATION on 06/13/2017    Consultants:   Discharged Condition: Improved  Hospital Course: Keith [name] is an 52 y.o. male who was admitted 06/13/2017 with a chief complaint of abscess right foot with wound dehiscence, with a final diagnosis of Abscess Right Foot.  Patient was brought to the operating room on 06/13/2017 and underwent Procedure(s): RIGHT BELOW KNEE AMPUTATION.    Patient was given perioperative antibiotics:  Anti-infectives (From admission, onward)   Start     Dose/Rate Route Frequency Ordered Stop   06/14/17 1000  darunavir-cobicistat (PREZCOBIX) 800-150 MG per tablet 1 tablet    Comments:  Swallow whole. Do NOT crush, break or chew tablets. Take with food.     1 tablet Oral Daily 06/13/17 1716     06/14/17 1000  abacavir-dolutegravir-lamiVUDine (TRIUMEQ) 600-50-300 MG per tablet 1 tablet     1 tablet Oral Daily 06/13/17 1716     06/14/17 1000  dapsone tablet 100 mg     100 mg Oral Daily 06/13/17 1716     06/14/17 0600  ceFAZolin (ANCEF) IVPB 2g/100 mL premix     2 g 200 mL/hr over 30 Minutes Intravenous On call to O.R. 06/13/17 1311 06/13/17 1507   06/13/17 2100  ceFAZolin (ANCEF) IVPB 2g/100 mL premix     2 g 200 mL/hr over 30 Minutes Intravenous Every 6 hours 06/13/17 1716 06/14/17 0920   06/13/17 1330  vancomycin (VANCOCIN) IVPB 1000 mg/200 mL premix     1,000 mg 200 mL/hr over 60 Minutes Intravenous  Once 06/13/17 1316 06/13/17 1438    .  Patient was given sequential compression devices, early ambulation, and aspirin for DVT prophylaxis.  Recent vital signs:  Patient Vitals for the past 24 hrs:  BP Temp Temp src Pulse Resp SpO2  06/17/17 0627 117/68 98.3 F (36.8 C) Axillary 92 17 98 %  06/16/17 2105 100/62 99.1 F (37.3 C) Oral 89 18 97 %  06/16/17 1100 117/69 99.7 F (37.6 C) Oral 95 18 98 %  .  Recent laboratory  studies: No results found.  Discharge Medications:   Allergies as of 06/17/2017      Reactions   Sulfa Antibiotics Itching      Medication List    STOP taking these medications   doxycycline 100 MG tablet Commonly known as:  VIBRA-TABS     TAKE these medications   abacavir-dolutegravir-lamiVUDine 600-50-300 MG tablet Commonly known as:  TRIUMEQ Take 1 tablet by mouth daily. What changed:  when to take this   amLODipine 10 MG tablet Commonly known as:  NORVASC Take 1 tablet (10 mg total) by mouth daily. What changed:  when to take this   cyclobenzaprine 10 MG tablet Commonly known as:  FLEXERIL Take 1 tablet (10 mg total) by mouth 2 (two) times daily as needed for muscle spasms.   dapsone 100 MG tablet Take 1 tablet (100 mg total) by mouth daily.   darunavir-cobicistat 800-150 MG tablet Commonly known as:  PREZCOBIX Take 1 tablet by mouth daily. Swallow whole. Do NOT crush, break or chew tablets. Take with food.   gabapentin 300 MG capsule Commonly known as:  NEURONTIN Take 1 capsule (300 mg total) by mouth 3 (three) times daily.   glimepiride 1 MG tablet Commonly known as:  AMARYL Take 1 tablet (1 mg total) by mouth every morning. What changed:  when to take this  Insulin Glargine 100 UNIT/ML Solostar Pen Commonly known as:  LANTUS SOLOSTAR Inject 20 Units into the skin daily at 10 pm.   lisinopril 5 MG tablet Commonly known as:  PRINIVIL,ZESTRIL Take 5 mg by mouth daily.   LORazepam 1 MG tablet Commonly known as:  ATIVAN Take 1 tablet (1 mg total) by mouth every 6 (six) hours as needed for anxiety.   NARCAN 4 MG/0.1ML Liqd nasal spray kit Generic drug:  naloxone Place 1 spray into the nose daily as needed (opioid overdose).   Oxycodone HCl 10 MG Tabs Take 1 tablet (10 mg total) by mouth 3 (three) times daily as needed. What changed:  reasons to take this   polyethylene glycol packet Commonly known as:  MIRALAX / GLYCOLAX Take 17 g by mouth daily.        Diagnostic Studies: No results found.  Patient benefited maximally from their hospital stay and there were no complications.     Disposition: 06-Home-Health Care Svc Discharge Instructions    Call MD / Call 911   Complete by:  As directed    If you experience chest pain or shortness of breath, CALL 911 and be transported to the hospital emergency room.  If you develope a fever above 101 F, pus (white drainage) or increased drainage or redness at the wound, or calf pain, call your surgeon's office.   Constipation Prevention   Complete by:  As directed    Drink plenty of fluids.  Prune juice may be helpful.  You may use a stool softener, such as Colace (over the counter) 100 mg twice a day.  Use MiraLax (over the counter) for constipation as needed.   Diet - low sodium heart healthy   Complete by:  As directed    Increase activity slowly as tolerated   Complete by:  As directed      Follow-up Information    [name], MD Follow up in 2 week(s).   Specialty:  Orthopedic Surgery Contact information: [address] [place] [address] [phone]            Signed: [name] 06/17/2017, 6:33 AM

## 2017-06-17 NOTE — Plan of Care (Signed)
  Education: Knowledge of General Education information will improve 06/17/2017 0321 - Progressing by Anson Fret, RN Note POC reviewed with pt.

## 2017-06-17 NOTE — Social Work (Signed)
Clinical Social Worker facilitated patient discharge including contacting patient family and facility to confirm patient discharge plans.  Clinical information faxed to facility and family agreeable with plan.    CSW arranged ambulance transport via [facility] [facility] at 4:30pm.    RN to call [phone] to give report prior to discharge.  Pt going to Room 110.  Clinical Social Worker will sign off for now as social work intervention is no longer needed. Please consult [place] again if new need arises.  Elissa Hefty, LCSW Clinical Social Worker [phone]

## 2017-06-18 DIAGNOSIS — E11621 Type 2 diabetes mellitus with foot ulcer: Secondary | ICD-10-CM | POA: Diagnosis not present

## 2017-06-18 DIAGNOSIS — N183 Chronic kidney disease, stage 3 (moderate): Secondary | ICD-10-CM | POA: Diagnosis not present

## 2017-06-18 DIAGNOSIS — B20 Human immunodeficiency virus [HIV] disease: Secondary | ICD-10-CM | POA: Diagnosis not present

## 2017-06-18 DIAGNOSIS — I10 Essential (primary) hypertension: Secondary | ICD-10-CM | POA: Diagnosis not present

## 2017-06-19 DIAGNOSIS — M79661 Pain in right lower leg: Secondary | ICD-10-CM | POA: Diagnosis not present

## 2017-06-19 DIAGNOSIS — R2689 Other abnormalities of gait and mobility: Secondary | ICD-10-CM | POA: Diagnosis not present

## 2017-06-19 DIAGNOSIS — Z89511 Acquired absence of right leg below knee: Secondary | ICD-10-CM | POA: Diagnosis not present

## 2017-06-25 ENCOUNTER — Encounter (INDEPENDENT_AMBULATORY_CARE_PROVIDER_SITE_OTHER): Payer: Self-pay | Admitting: Orthopedic Surgery

## 2017-06-25 ENCOUNTER — Ambulatory Visit (INDEPENDENT_AMBULATORY_CARE_PROVIDER_SITE_OTHER): Payer: Medicare Other

## 2017-06-25 ENCOUNTER — Ambulatory Visit (INDEPENDENT_AMBULATORY_CARE_PROVIDER_SITE_OTHER): Payer: Medicare Other | Admitting: Orthopedic Surgery

## 2017-06-25 VITALS — Ht 69.0 in | Wt 243.0 lb

## 2017-06-25 DIAGNOSIS — N183 Chronic kidney disease, stage 3 (moderate): Secondary | ICD-10-CM | POA: Diagnosis not present

## 2017-06-25 DIAGNOSIS — E11621 Type 2 diabetes mellitus with foot ulcer: Secondary | ICD-10-CM | POA: Diagnosis not present

## 2017-06-25 DIAGNOSIS — F329 Major depressive disorder, single episode, unspecified: Secondary | ICD-10-CM | POA: Diagnosis not present

## 2017-06-25 DIAGNOSIS — Z89511 Acquired absence of right leg below knee: Secondary | ICD-10-CM

## 2017-06-25 DIAGNOSIS — B20 Human immunodeficiency virus [HIV] disease: Secondary | ICD-10-CM | POA: Diagnosis not present

## 2017-06-25 NOTE — Progress Notes (Signed)
Office Visit Note   Patient: Keith [name]           Date of Birth: 04-24-1965           MRN: [id number] Visit Date: 06/25/2017              Requested by: Scot Jun, [place], [place] [address] PCP: Scot Jun, FNP  Chief Complaint  Patient presents with  . Right Leg - Routine Post Op    06/13/17 right BKA      HPI: Patient is a 52 year old gentleman 2 weeks status post right transtibial amputation currently in skilled nursing.  Assessment & Plan: Visit Diagnoses:  1. Acquired absence of right leg below knee [facility])     Plan: Patient is given a prescription for a biotech for a stump shrinker and a K2 level prosthesis.  Patient will need to wear the stump shrinker around the clock.  Patient was given instructions as well as therapy at rehab to aggressively work on knee extension.  Follow-Up Instructions: Return in about 3 weeks (around 07/16/2017).   Ortho Exam  Patient is alert, oriented, no adenopathy, well-dressed, normal affect, normal respiratory effort. Examination the incision is well-healed there is no redness no cellulitis no pain to palpation.  Passively I can get full extension of his knee.  Discussed the importance of working on extension.  Imaging: No results found.    Labs: Lab Results  Component Value Date   HGBA1C 7.4 (H) 06/13/2017   HGBA1C 10.1 (H) 03/15/2017   HGBA1C 8.2 01/01/2017   ESRSEDRATE 140 (H) 04/28/2017   ESRSEDRATE 127 (H) 03/15/2017   ESRSEDRATE 94 (H) 01/07/2017   CRP 14.9 (H) 04/28/2017   CRP 23.1 (H) 03/15/2017   CRP 1.8 (H) 01/08/2017   LABURIC 6.3 08/07/2009   REPTSTATUS 05/08/2017 FINAL 05/03/2017   GRAMSTAIN  04/29/2017    NO WBC SEEN RARE GRAM POSITIVE COCCI IN PAIRS IN SINGLES    CULT NO GROWTH 5 DAYS 05/03/2017   LABORGA METHICILLIN RESISTANT STAPHYLOCOCCUS AUREUS 04/29/2017    @LABSALLVALUES (HGBA1)@  Body mass index is 35.88 kg/m.  Orders:  No orders of the defined types were  placed in this encounter.  No orders of the defined types were placed in this encounter.    Procedures: No procedures performed  Clinical Data: No additional findings.  ROS:  All other systems negative, except as noted in the HPI. Review of Systems  Objective: Vital Signs: Ht 5\' 9"  (1.753 m)   Wt 243 lb (110.2 kg)   BMI 35.88 kg/m   Specialty Comments:  No specialty comments available.  PMFS History: Patient Active Problem List   Diagnosis Date Noted  . Below knee amputation status, right ([place]) 06/13/2017  . S/P transmetatarsal amputation of foot, right ([place]) 05/07/2017  . Wound infection   . Bacteremia   . Fever   . Abscess of right foot   . Sepsis ([place]) 03/15/2017  . CKD (chronic kidney disease) stage 3, GFR 30-59 ml/min (HCC) 03/15/2017  . Diabetic polyneuropathy associated with type 2 diabetes mellitus ([place]) 01/23/2017  . Right foot ulcer, limited to breakdown of skin ([place]) 01/07/2017  . Dehydration 09/29/2016  . AKI (acute kidney injury) ([place]) 09/28/2016  . Diarrhea with dehydration 09/28/2016  . Nausea vomiting and diarrhea 09/28/2016  . Onychomycosis of multiple toenails with type 2 diabetes mellitus ([place]) 08/29/2015  . MRSA carrier 04/20/2014  . Testosterone deficiency 04/20/2014  . Genital warts 02/22/2014  .  HIV disease ([place])   . Insulin-requiring or dependent type II diabetes mellitus ([place]) 02/04/2014  . Cough 05/04/2013  . Dental anomaly 11/20/2012  . Arthritis of right knee 02/23/2012  . Hyperlipidemia 11/10/2011  . Chronic pain 08/07/2011  . Meralgia paraesthetica 04/23/2011  . ERECTILE DYSFUNCTION 08/22/2008  . Essential hypertension 05/19/2008   Past Medical History:  Diagnosis Date  . AIDS ([place])   . Chronic knee pain    right  . Chronic pain   . Diabetes type 2, uncontrolled ([place])    HgA1c 17.6 (04/27/2010)  . Diabetic foot ulcer ([place]) 01/2017   right foot  . Erectile dysfunction   . Genital warts   . HIV (human immunodeficiency  virus infection) ([place]) 2009   CD4 count 100, VL 13800 (05/01/2010)  . Hypertension   . Osteomyelitis ([place])    h/o hand  . Osteomyelitis of metatarsal ([place]) 04/28/2017    Family History  Problem Relation Age of Onset  . Hypertension Mother   . Arthritis Father   . Hypertension Father   . Hypertension Brother   . Cancer Maternal Grandmother 19       unknown type of cancer  . Depression Paternal Grandmother     Past Surgical History:  Procedure Laterality Date  . AMPUTATION Right 05/02/2017   Procedure: AMPUTATION TRANSMETARSAL;  Surgeon: [name], MD;  Location: [facility];  Service: Orthopedics;  Laterality: Right;  . AMPUTATION Right 06/13/2017   Procedure: RIGHT BELOW KNEE AMPUTATION;  Surgeon: [name], MD;  Location: [facility];  Service: Orthopedics;  Laterality: Right;  . BELOW KNEE LEG AMPUTATION Right 06/13/2017  . HERNIA REPAIR    . I&D EXTREMITY Left 08/21/2014   Procedure: INCISION AND DRAINAGE LEFT SMALL FINGER;  Surgeon: Leanora Cover, MD;  Location: [facility];  Service: Orthopedics;  Laterality: Left;  . I&D EXTREMITY Right 03/18/2017   Procedure: IRRIGATION AND DEBRIDEMENT EXTREMITY;  Surgeon: [name], MD;  Location: [facility];  Service: Orthopedics;  Laterality: Right;  . MINOR IRRIGATION AND DEBRIDEMENT OF WOUND Right 04/22/2014   Procedure: IRRIGATION AND DEBRIDEMENT OF RIGHT NECK ABCESS;  Surgeon: Jerrell Belfast, MD;  Location: [facility];  Service: ENT;  Laterality: Right;  . MULTIPLE EXTRACTIONS WITH ALVEOLOPLASTY N/A 01/18/2013   Procedure: MULTIPLE EXTRACION 3, 6, 7, 10, 11, 13, 21, 22, 27, 28, 29, 30 WITH ALVEOLOPLASTY;  Surgeon: Gae Bon, DDS;  Location: [facility];  Service: Oral Surgery;  Laterality: N/A;  . SKIN SPLIT GRAFT Right 03/21/2017   Procedure: IRRIGATION AND DEBRIDEMENT RIGHT FOOT AND APPLY SPLIT THICKNESS SKIN GRAFT AND WOUND VAC;  Surgeon: [name], MD;  Location: [facility];  Service: Orthopedics;  Laterality: Right;  . TEE WITHOUT CARDIOVERSION N/A  05/02/2017   Procedure: TRANSESOPHAGEAL ECHOCARDIOGRAM (TEE);  Surgeon: Acie Fredrickson Wonda Cheng, MD;  Location: [facility] OR;  Service: Cardiovascular;  Laterality: N/A;  coincidental to orthopedic case   Social History   Occupational History  . Not on file  Tobacco Use  . Smoking status: Never Smoker  . Smokeless tobacco: Never Used  Substance and Sexual Activity  . Alcohol use: No    Alcohol/week: 0.0 oz  . Drug use: No  . Sexual activity: Yes    Partners: Female    Comment: given condoms

## 2017-06-26 DIAGNOSIS — R52 Pain, unspecified: Secondary | ICD-10-CM | POA: Diagnosis not present

## 2017-06-26 DIAGNOSIS — I10 Essential (primary) hypertension: Secondary | ICD-10-CM | POA: Diagnosis not present

## 2017-06-26 DIAGNOSIS — F329 Major depressive disorder, single episode, unspecified: Secondary | ICD-10-CM | POA: Diagnosis not present

## 2017-06-26 DIAGNOSIS — N183 Chronic kidney disease, stage 3 (moderate): Secondary | ICD-10-CM | POA: Diagnosis not present

## 2017-06-26 DIAGNOSIS — Z89511 Acquired absence of right leg below knee: Secondary | ICD-10-CM | POA: Diagnosis not present

## 2017-06-26 DIAGNOSIS — B20 Human immunodeficiency virus [HIV] disease: Secondary | ICD-10-CM | POA: Diagnosis not present

## 2017-06-26 DIAGNOSIS — E0851 Diabetes mellitus due to underlying condition with diabetic peripheral angiopathy without gangrene: Secondary | ICD-10-CM | POA: Diagnosis not present

## 2017-06-26 DIAGNOSIS — E119 Type 2 diabetes mellitus without complications: Secondary | ICD-10-CM | POA: Diagnosis not present

## 2017-07-01 DIAGNOSIS — N183 Chronic kidney disease, stage 3 (moderate): Secondary | ICD-10-CM | POA: Diagnosis not present

## 2017-07-01 DIAGNOSIS — E119 Type 2 diabetes mellitus without complications: Secondary | ICD-10-CM | POA: Diagnosis not present

## 2017-07-01 DIAGNOSIS — I10 Essential (primary) hypertension: Secondary | ICD-10-CM | POA: Diagnosis not present

## 2017-07-01 DIAGNOSIS — E0851 Diabetes mellitus due to underlying condition with diabetic peripheral angiopathy without gangrene: Secondary | ICD-10-CM | POA: Diagnosis not present

## 2017-07-01 DIAGNOSIS — B20 Human immunodeficiency virus [HIV] disease: Secondary | ICD-10-CM | POA: Diagnosis not present

## 2017-07-01 DIAGNOSIS — Z89511 Acquired absence of right leg below knee: Secondary | ICD-10-CM | POA: Diagnosis not present

## 2017-07-03 IMAGING — CT CT HEAD W/O CM
3 series · 14 of 47 positions shown, 16 images · non-contrast
Comparison: CT scan of September 15, 2015.

CLINICAL DATA: Syncope, headache.

EXAM:
CT HEAD WITHOUT CONTRAST
TECHNIQUE: Contiguous axial images were obtained from the base of the skull
through the vertex without intravenous contrast.

[Series 4: head 5.0 h30s · axial · 0.46mm/px · z∈[-203,-68]mm · 8 of 33 slices shown, 10 images]
[im 3/33  brain]
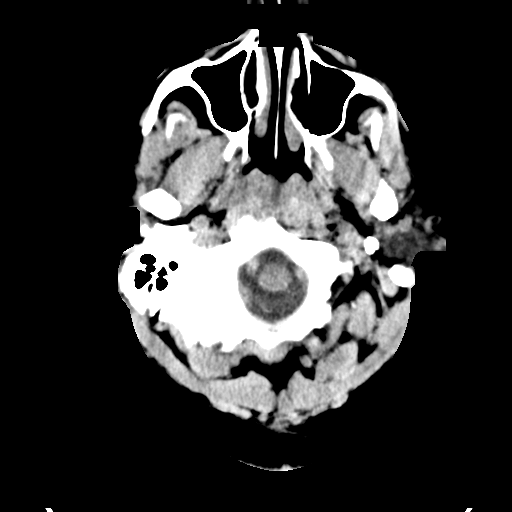
[im 3/33  bone]
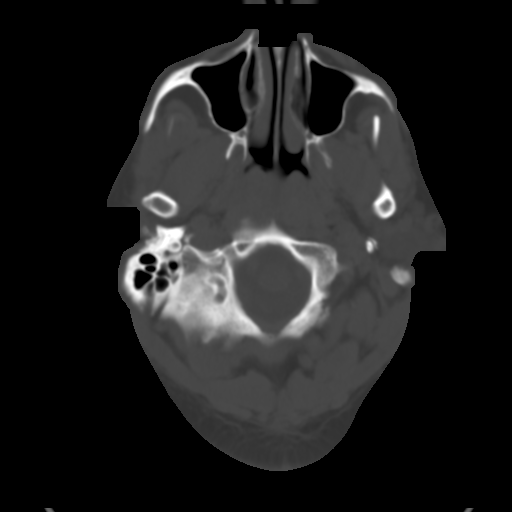
[im 7/33  brain]
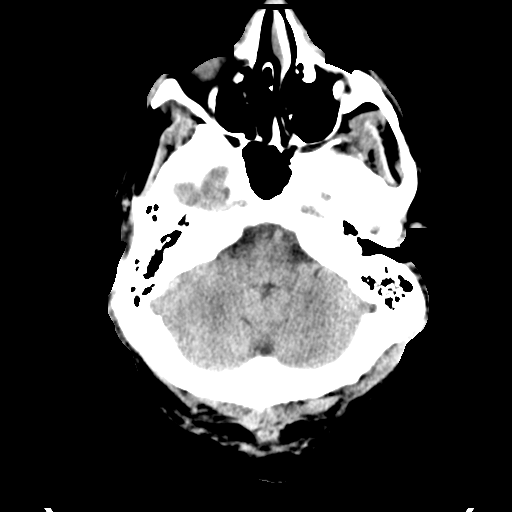
[im 10/33  brain]
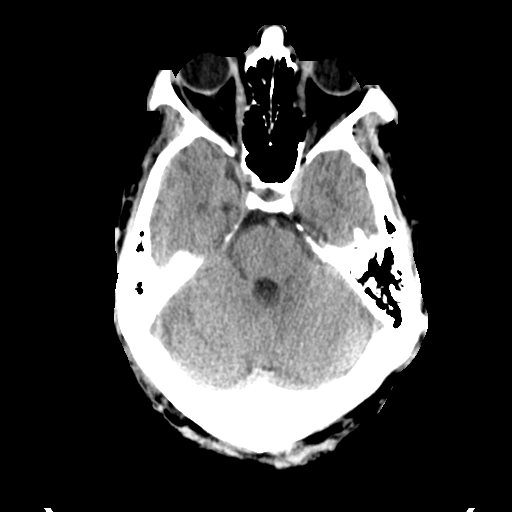
[im 15/33  brain]
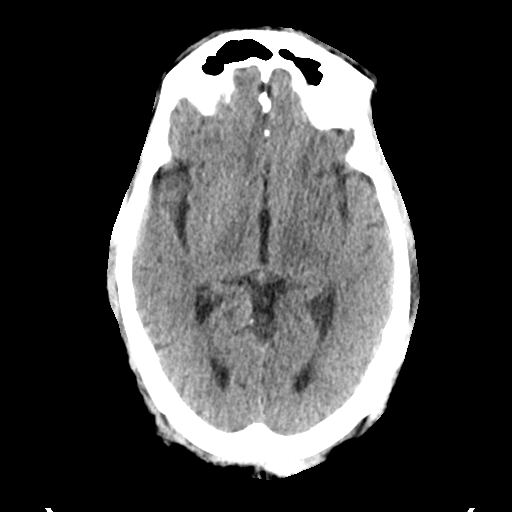
[im 18/33  brain]
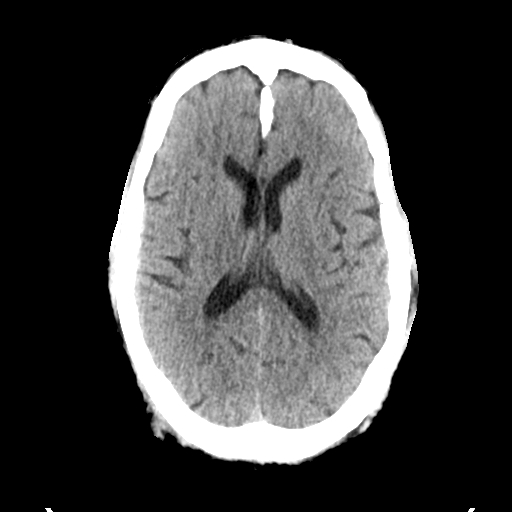
[im 18/33  bone]
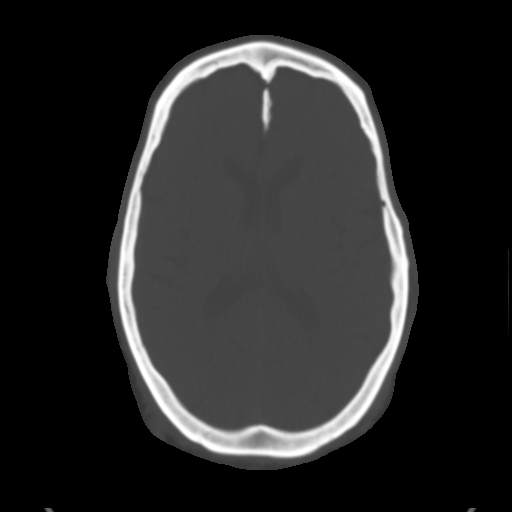
[im 23/33  brain]
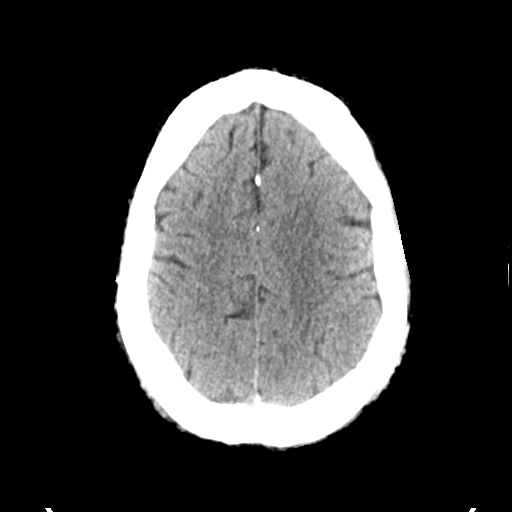
[im 26/33  brain]
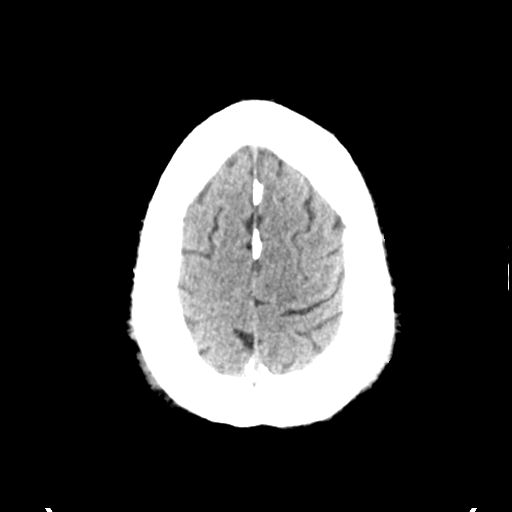
[im 30/33  brain]
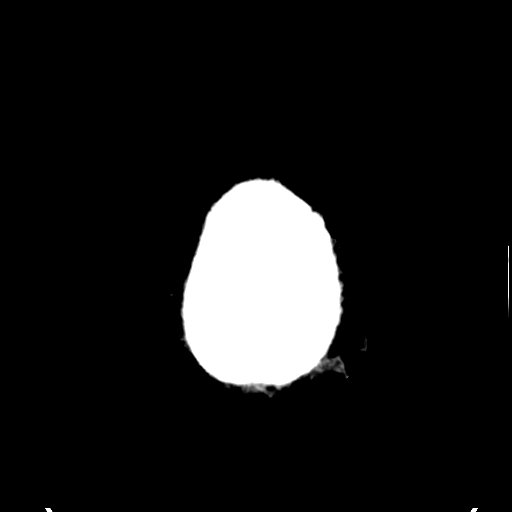

[Series 6: head 3.0 mpr cor · coronal · 0.32mm/px · 3 of 82 slices shown]
[im 28/82  brain]
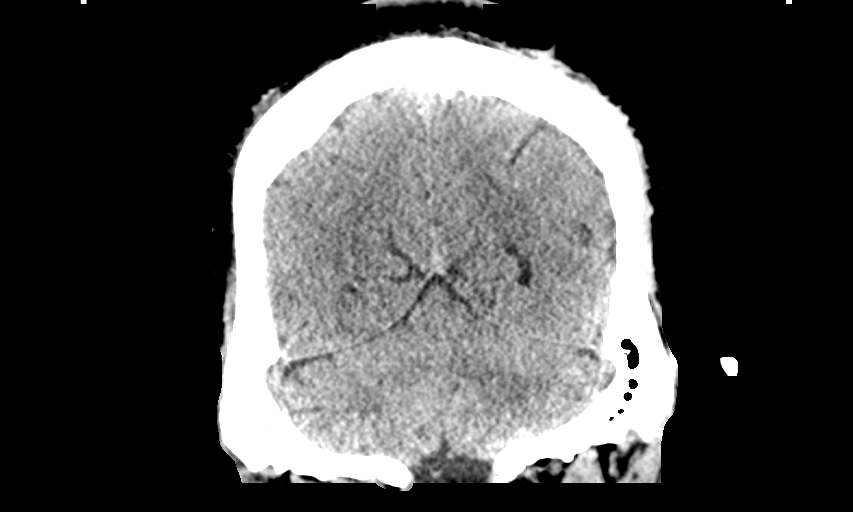
[im 37/82  brain]
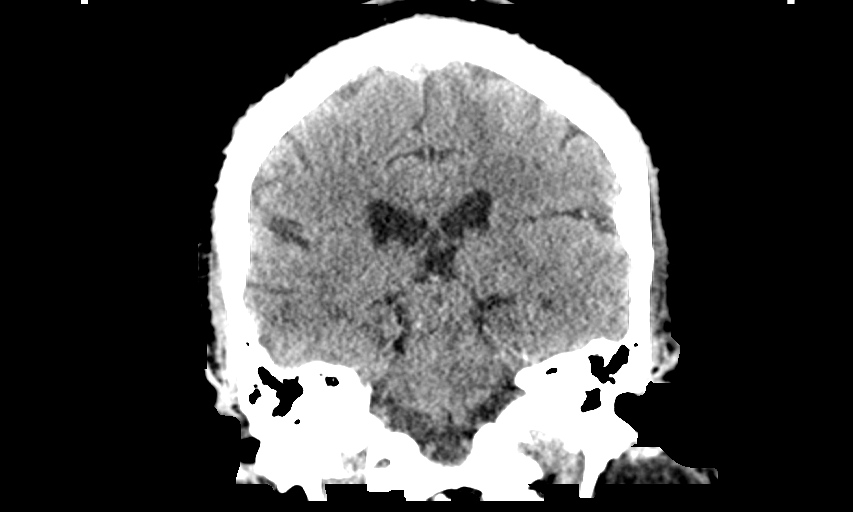
[im 46/82  brain]
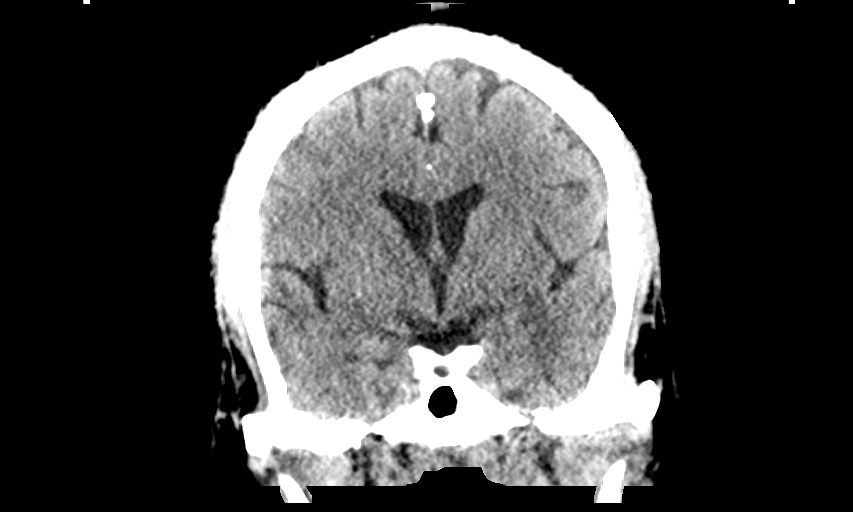

[Series 7: head 3.0 mpr sag · sagittal · 0.32mm/px · 3 of 67 slices shown]
[im 23/67  brain]
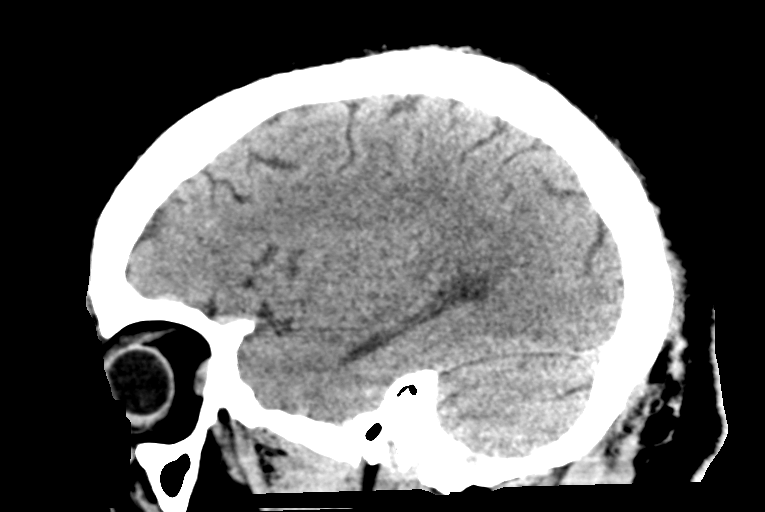
[im 34/67  brain]
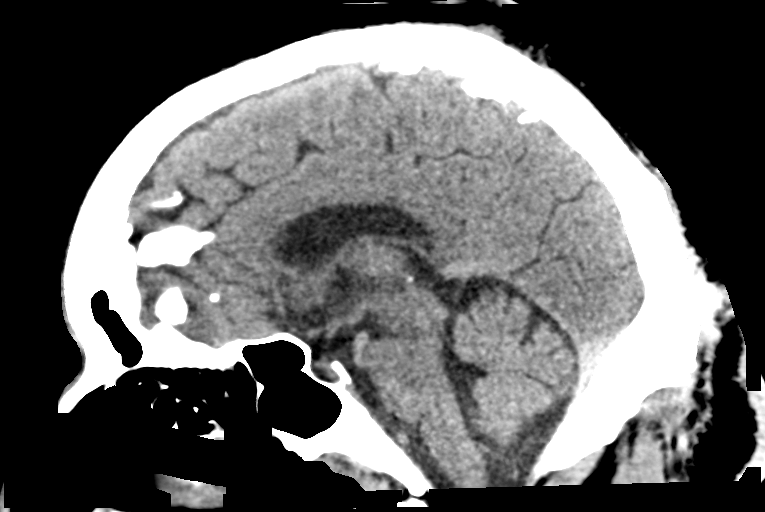
[im 45/67  brain]
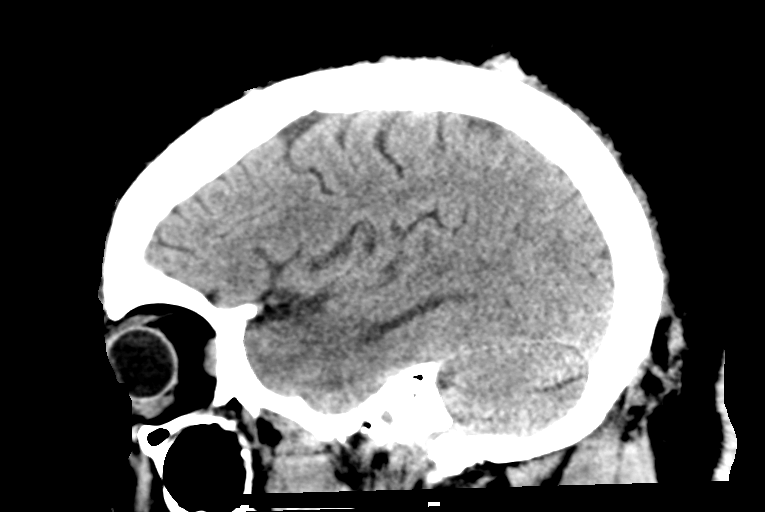

[14 of 47 positions shown; findings below may reference images not displayed]

FINDINGS: Brain: No evidence of acute infarction, hemorrhage, hydrocephalus,
extra-axial collection or mass lesion/mass effect.

Vascular: No hyperdense vessel or unexpected calcification.

Skull: Normal. Negative for fracture or focal lesion.

Sinuses/Orbits: Small mucous retention cyst is noted in left
maxillary sinus.

Other: None.
IMPRESSION: No acute intracranial abnormality seen.

## 2017-07-03 IMAGING — CR DG KNEE COMPLETE 4+V*R*
4 series · 4 of 4 positions shown · non-contrast
Comparison: 03/03/2016.

CLINICAL DATA: Fall today.  History of chronic knee pain.

EXAM:
RIGHT KNEE - COMPLETE 4+ VIEW

[knee ap]
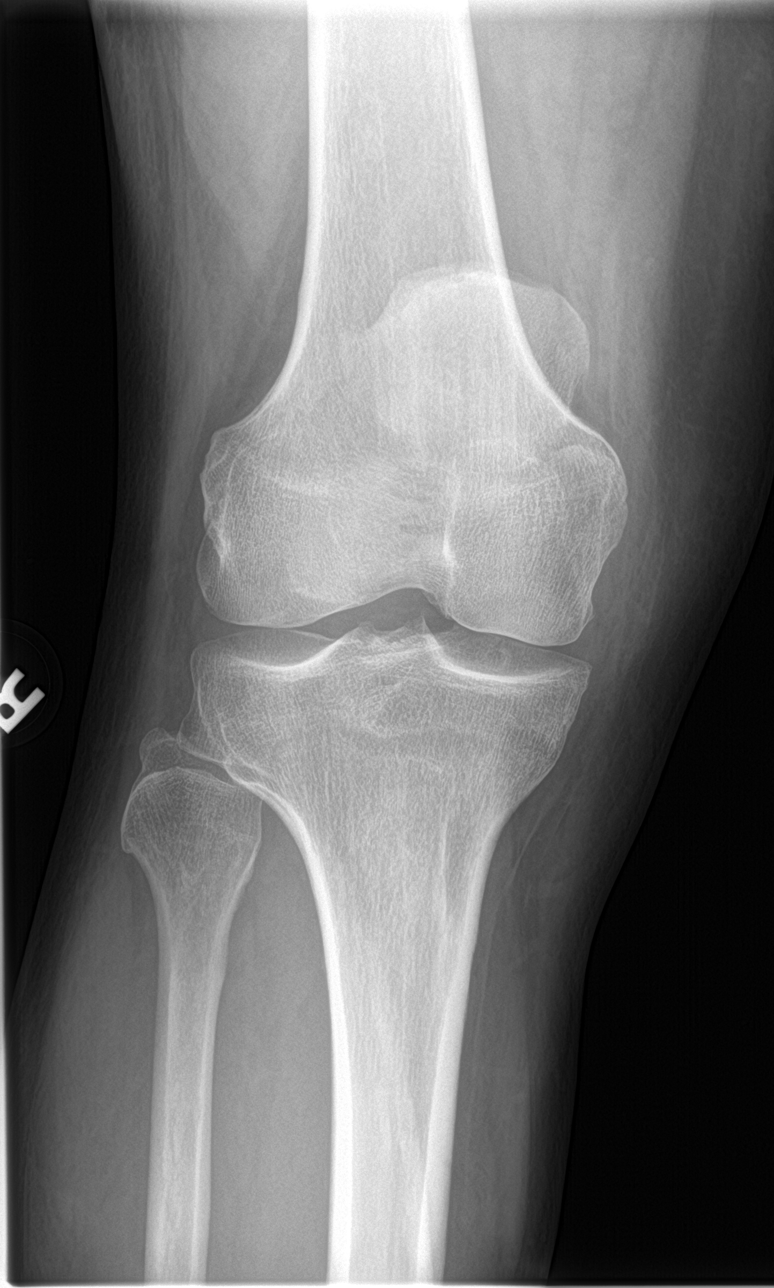

[knee lat]
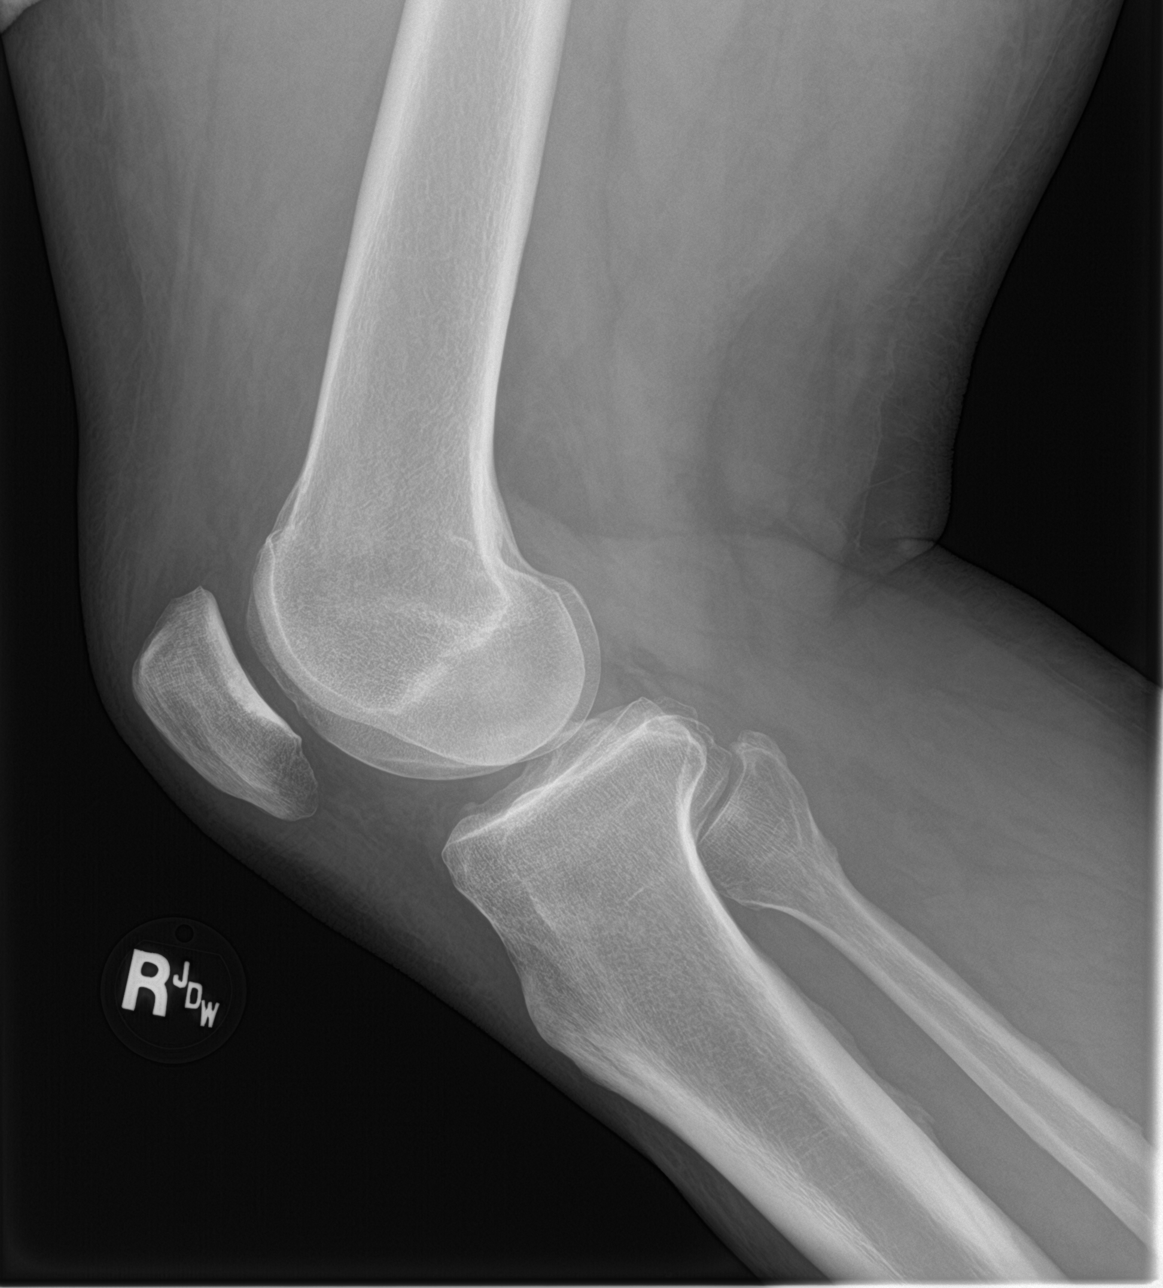

[knee obl (1 of 2)]
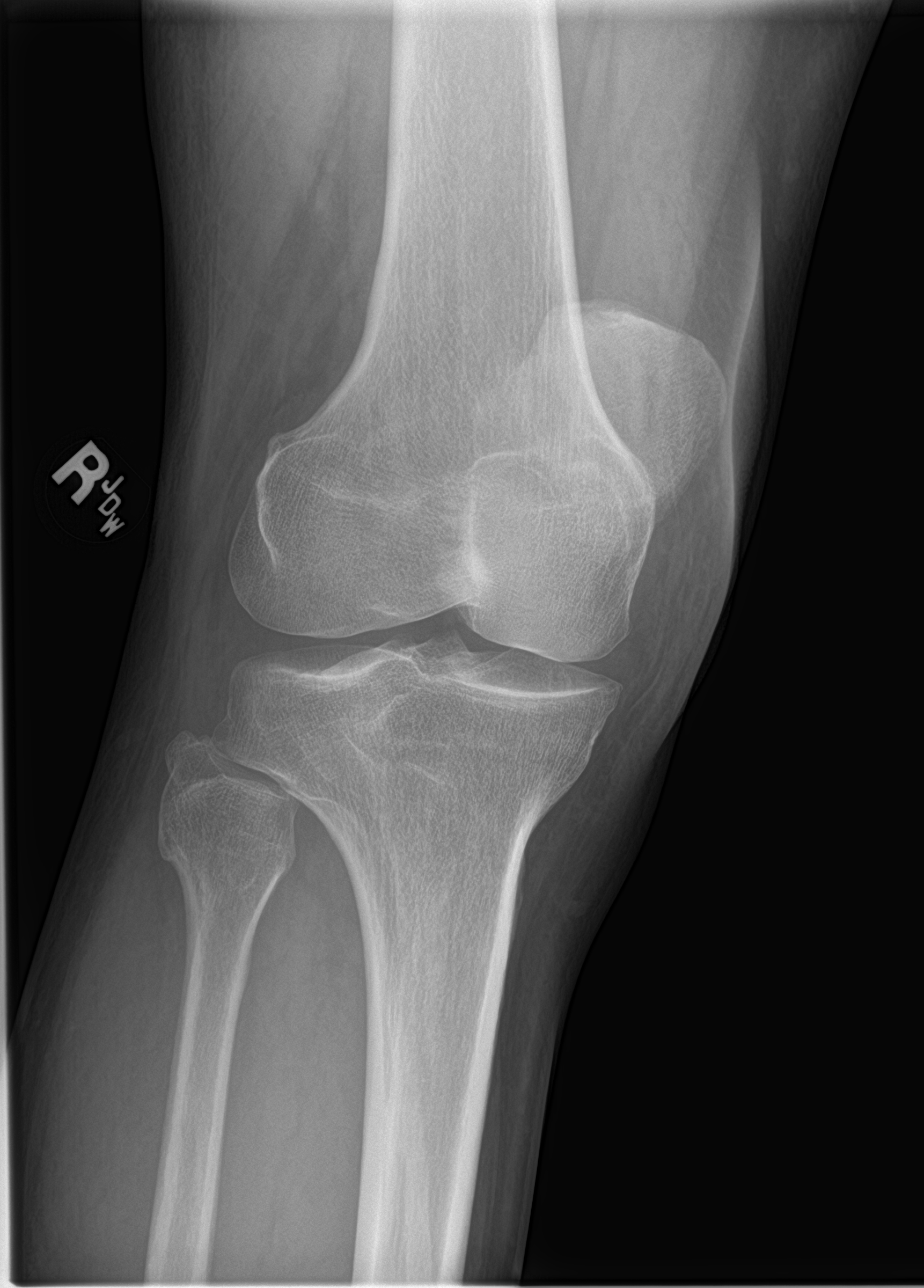

[knee obl (2 of 2)]
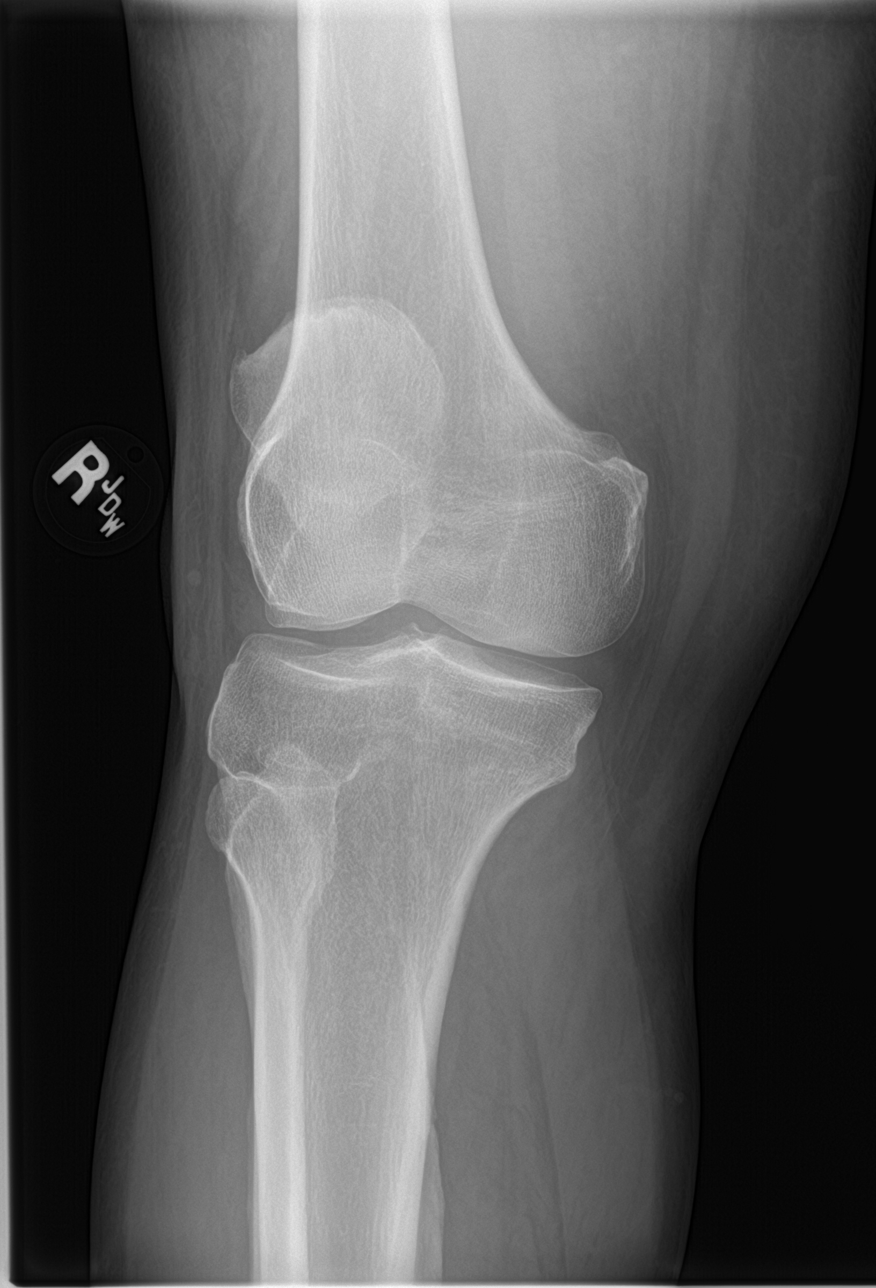

[4 of 4 positions shown; findings below may reference images not displayed]

FINDINGS: Mild tricompartment degenerative change. No acute bony or joint
abnormality identified. No prominent fusion.
IMPRESSION: Mild tricompartment degenerative change.  No acute abnormality.

## 2017-07-03 IMAGING — CR DG LUMBAR SPINE 2-3V
4 series · 4 of 4 positions shown · non-contrast
Comparison: None.

CLINICAL DATA: Chronic low back pain.  Fall

EXAM:
LUMBAR SPINE - 2-3 VIEW

[l-spine ap]
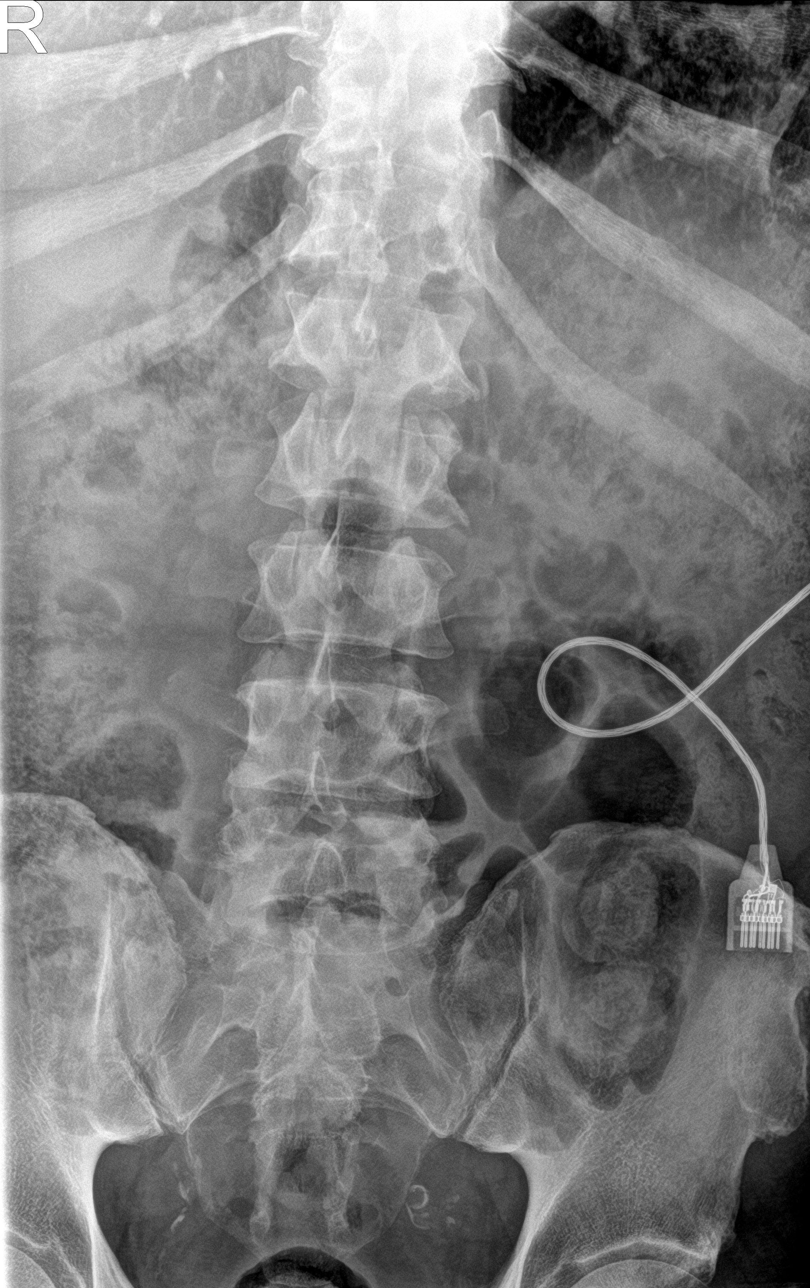

[l-spine lat (1 of 2)]
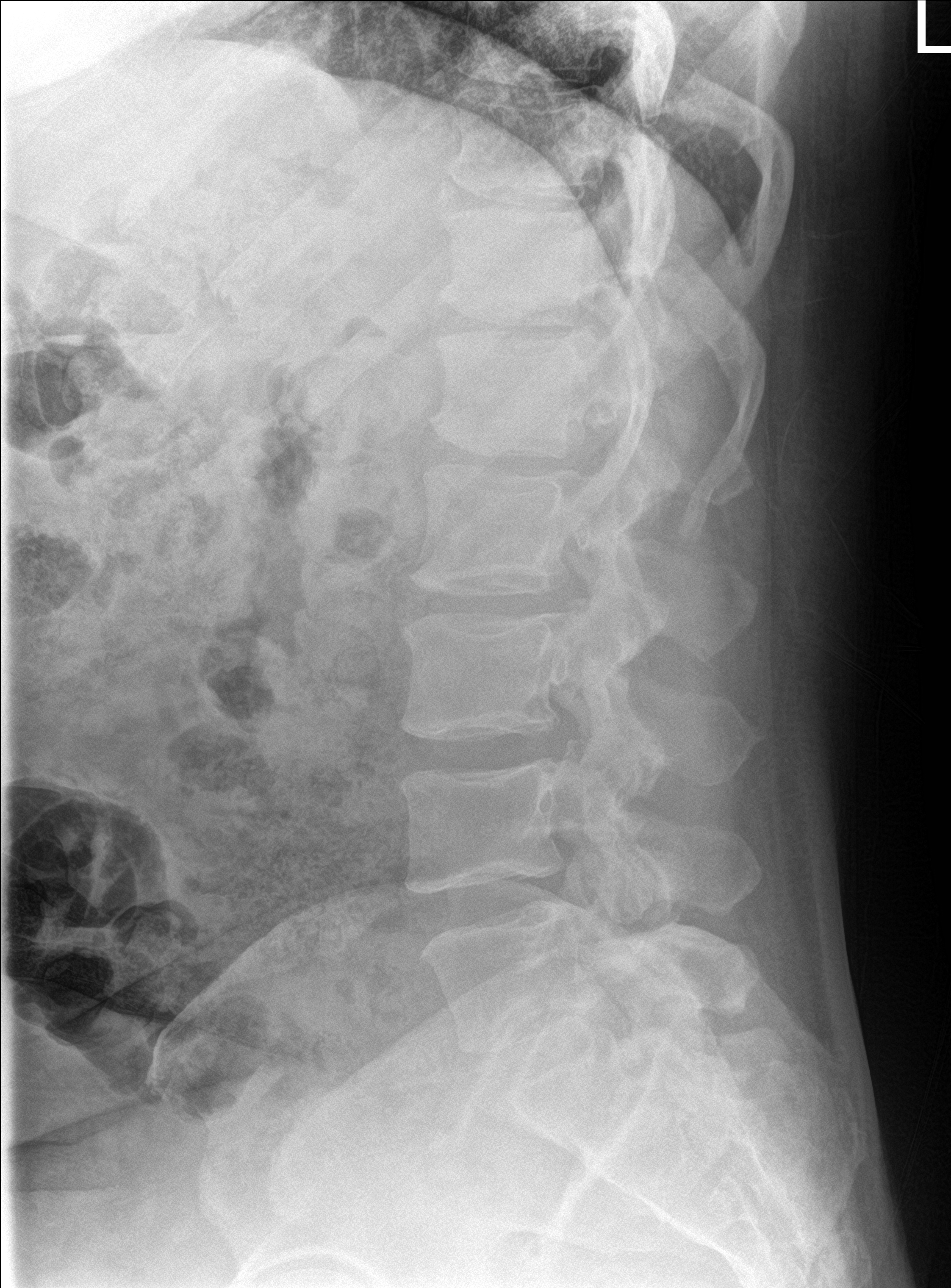

[l-spine spot]
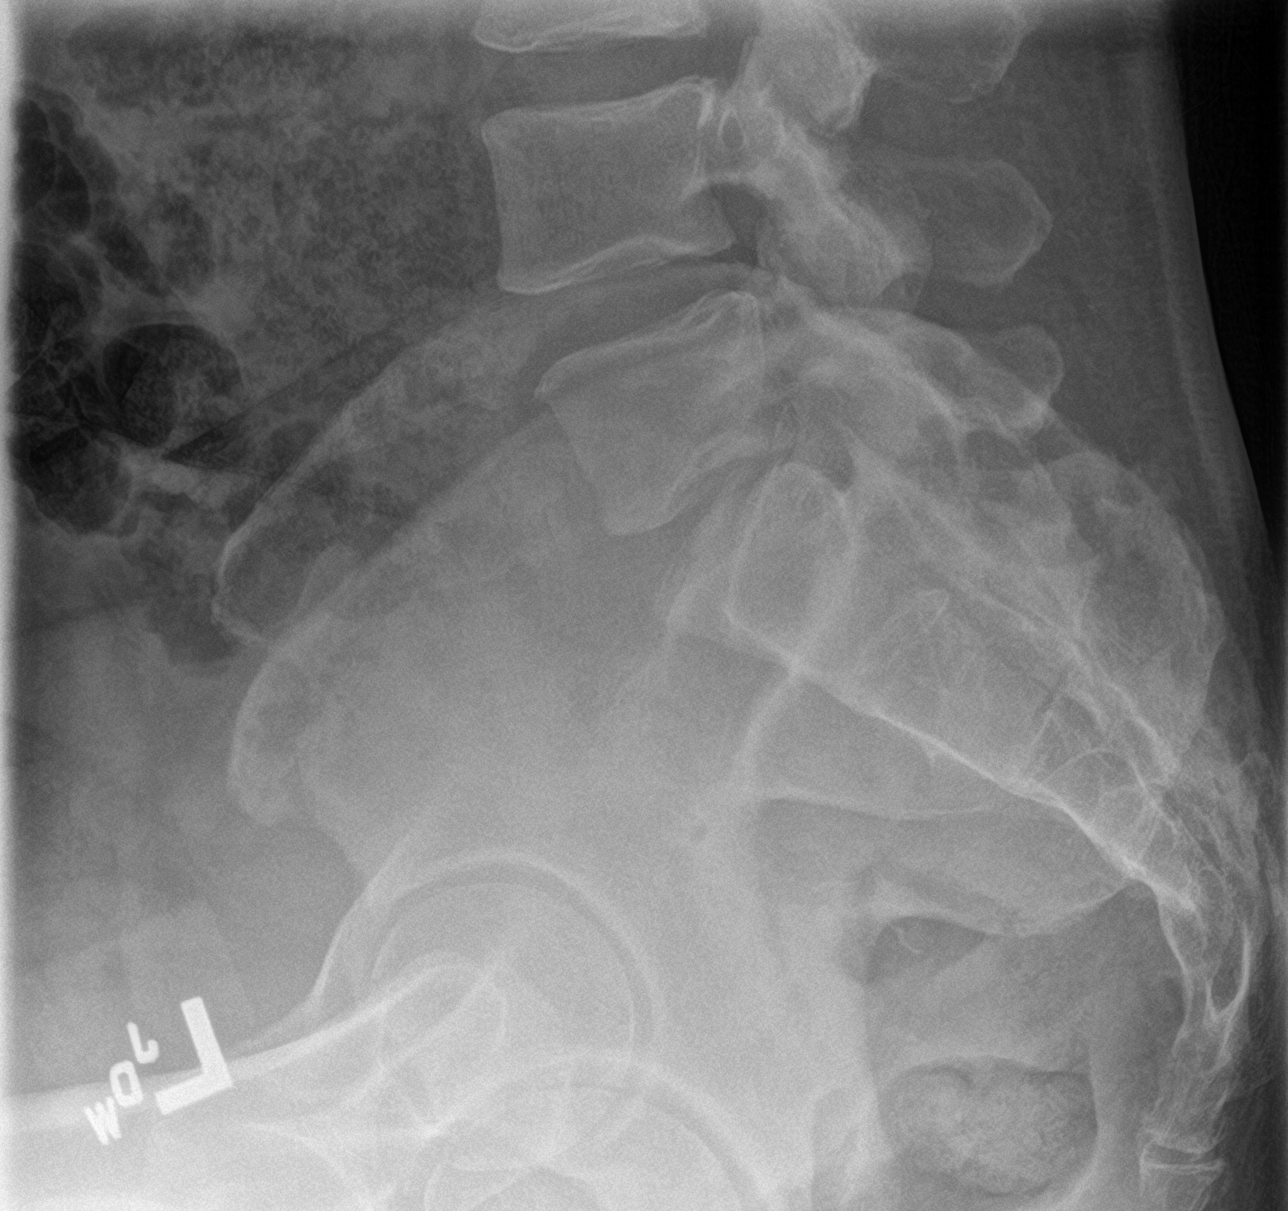

[l-spine lat (2 of 2)]
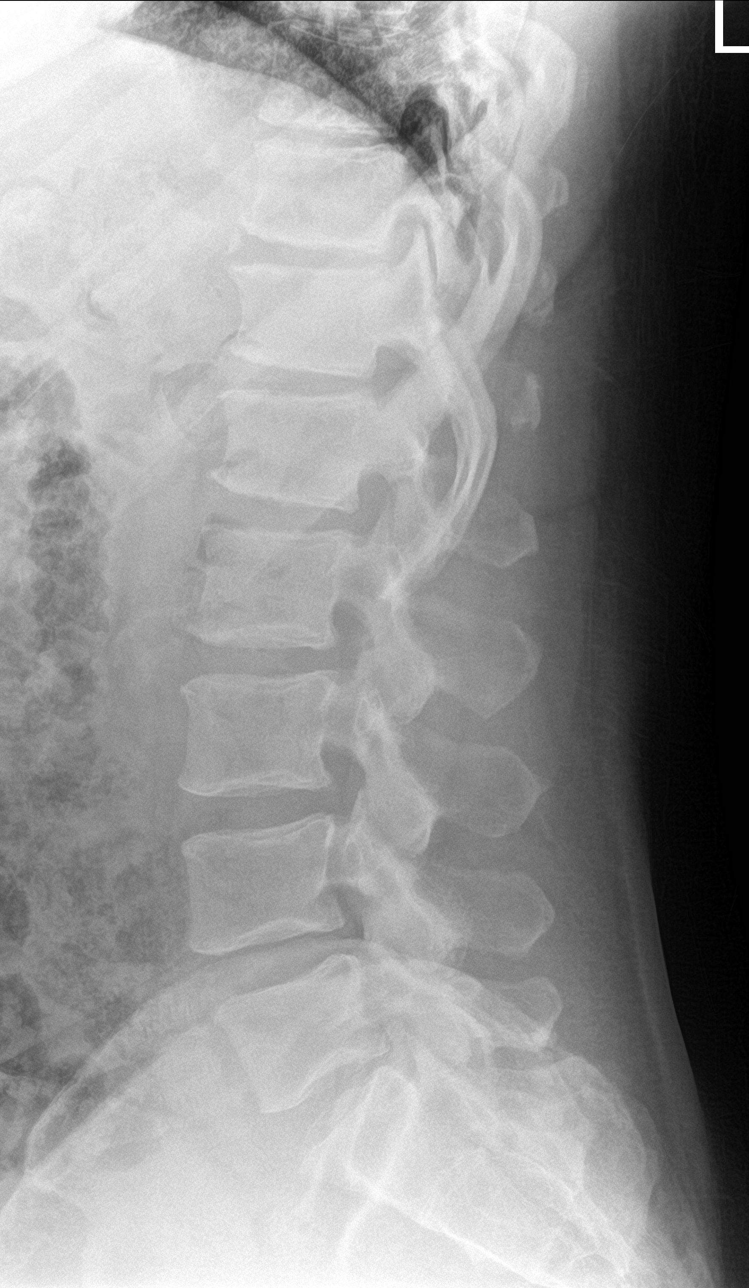

[4 of 4 positions shown; findings below may reference images not displayed]

FINDINGS: Normal alignment. Negative for fracture or pars defect. Mild disc
degeneration L1-2, L2-3, L3-4.
IMPRESSION: Negative for fracture.  Mild lumbar disc degeneration

## 2017-07-04 DIAGNOSIS — E119 Type 2 diabetes mellitus without complications: Secondary | ICD-10-CM | POA: Diagnosis not present

## 2017-07-04 DIAGNOSIS — I10 Essential (primary) hypertension: Secondary | ICD-10-CM | POA: Diagnosis not present

## 2017-07-04 DIAGNOSIS — N183 Chronic kidney disease, stage 3 (moderate): Secondary | ICD-10-CM | POA: Diagnosis not present

## 2017-07-04 DIAGNOSIS — D631 Anemia in chronic kidney disease: Secondary | ICD-10-CM | POA: Diagnosis not present

## 2017-07-07 ENCOUNTER — Telehealth (INDEPENDENT_AMBULATORY_CARE_PROVIDER_SITE_OTHER): Payer: Self-pay | Admitting: *Deleted

## 2017-07-07 NOTE — Telephone Encounter (Signed)
Received triage call  from patricia at [facility] message stating pt was having issue with drainage in incision and would like a call back to disduss. I called number given and left message to have her return my call.

## 2017-07-08 DIAGNOSIS — G894 Chronic pain syndrome: Secondary | ICD-10-CM | POA: Diagnosis not present

## 2017-07-08 DIAGNOSIS — E0851 Diabetes mellitus due to underlying condition with diabetic peripheral angiopathy without gangrene: Secondary | ICD-10-CM | POA: Diagnosis not present

## 2017-07-08 DIAGNOSIS — S91302S Unspecified open wound, left foot, sequela: Secondary | ICD-10-CM | POA: Diagnosis not present

## 2017-07-08 DIAGNOSIS — Z89511 Acquired absence of right leg below knee: Secondary | ICD-10-CM | POA: Diagnosis not present

## 2017-07-08 DIAGNOSIS — B20 Human immunodeficiency virus [HIV] disease: Secondary | ICD-10-CM | POA: Diagnosis not present

## 2017-07-08 DIAGNOSIS — M79661 Pain in right lower leg: Secondary | ICD-10-CM | POA: Diagnosis not present

## 2017-07-08 DIAGNOSIS — I10 Essential (primary) hypertension: Secondary | ICD-10-CM | POA: Diagnosis not present

## 2017-07-08 DIAGNOSIS — Z79891 Long term (current) use of opiate analgesic: Secondary | ICD-10-CM | POA: Diagnosis not present

## 2017-07-08 DIAGNOSIS — R2689 Other abnormalities of gait and mobility: Secondary | ICD-10-CM | POA: Diagnosis not present

## 2017-07-08 DIAGNOSIS — M545 Low back pain: Secondary | ICD-10-CM | POA: Diagnosis not present

## 2017-07-08 DIAGNOSIS — N183 Chronic kidney disease, stage 3 (moderate): Secondary | ICD-10-CM | POA: Diagnosis not present

## 2017-07-08 DIAGNOSIS — Z79899 Other long term (current) drug therapy: Secondary | ICD-10-CM | POA: Diagnosis not present

## 2017-07-08 DIAGNOSIS — E119 Type 2 diabetes mellitus without complications: Secondary | ICD-10-CM | POA: Diagnosis not present

## 2017-07-09 DIAGNOSIS — N182 Chronic kidney disease, stage 2 (mild): Secondary | ICD-10-CM | POA: Diagnosis not present

## 2017-07-09 DIAGNOSIS — E11621 Type 2 diabetes mellitus with foot ulcer: Secondary | ICD-10-CM | POA: Diagnosis not present

## 2017-07-09 DIAGNOSIS — I10 Essential (primary) hypertension: Secondary | ICD-10-CM | POA: Diagnosis not present

## 2017-07-09 DIAGNOSIS — B20 Human immunodeficiency virus [HIV] disease: Secondary | ICD-10-CM | POA: Diagnosis not present

## 2017-07-14 DIAGNOSIS — M79661 Pain in right lower leg: Secondary | ICD-10-CM | POA: Diagnosis not present

## 2017-07-14 DIAGNOSIS — R2689 Other abnormalities of gait and mobility: Secondary | ICD-10-CM | POA: Diagnosis not present

## 2017-07-14 DIAGNOSIS — E11621 Type 2 diabetes mellitus with foot ulcer: Secondary | ICD-10-CM | POA: Diagnosis not present

## 2017-07-14 DIAGNOSIS — N183 Chronic kidney disease, stage 3 (moderate): Secondary | ICD-10-CM | POA: Diagnosis not present

## 2017-07-14 DIAGNOSIS — B20 Human immunodeficiency virus [HIV] disease: Secondary | ICD-10-CM | POA: Diagnosis not present

## 2017-07-14 DIAGNOSIS — S91302S Unspecified open wound, left foot, sequela: Secondary | ICD-10-CM | POA: Diagnosis not present

## 2017-07-14 DIAGNOSIS — Z89511 Acquired absence of right leg below knee: Secondary | ICD-10-CM | POA: Diagnosis not present

## 2017-07-14 DIAGNOSIS — I10 Essential (primary) hypertension: Secondary | ICD-10-CM | POA: Diagnosis not present

## 2017-07-15 DIAGNOSIS — E119 Type 2 diabetes mellitus without complications: Secondary | ICD-10-CM | POA: Diagnosis not present

## 2017-07-15 DIAGNOSIS — I10 Essential (primary) hypertension: Secondary | ICD-10-CM | POA: Diagnosis not present

## 2017-07-15 DIAGNOSIS — Z89511 Acquired absence of right leg below knee: Secondary | ICD-10-CM | POA: Diagnosis not present

## 2017-07-15 DIAGNOSIS — B20 Human immunodeficiency virus [HIV] disease: Secondary | ICD-10-CM | POA: Diagnosis not present

## 2017-07-16 DIAGNOSIS — M79661 Pain in right lower leg: Secondary | ICD-10-CM | POA: Diagnosis not present

## 2017-07-16 DIAGNOSIS — Z89511 Acquired absence of right leg below knee: Secondary | ICD-10-CM | POA: Diagnosis not present

## 2017-07-16 DIAGNOSIS — S91302S Unspecified open wound, left foot, sequela: Secondary | ICD-10-CM | POA: Diagnosis not present

## 2017-07-16 DIAGNOSIS — R2689 Other abnormalities of gait and mobility: Secondary | ICD-10-CM | POA: Diagnosis not present

## 2017-07-17 ENCOUNTER — Encounter (INDEPENDENT_AMBULATORY_CARE_PROVIDER_SITE_OTHER): Payer: Self-pay | Admitting: Orthopedic Surgery

## 2017-07-17 ENCOUNTER — Ambulatory Visit (INDEPENDENT_AMBULATORY_CARE_PROVIDER_SITE_OTHER): Payer: Medicare Other | Admitting: Orthopedic Surgery

## 2017-07-17 DIAGNOSIS — Z89511 Acquired absence of right leg below knee: Secondary | ICD-10-CM

## 2017-07-18 DIAGNOSIS — R2689 Other abnormalities of gait and mobility: Secondary | ICD-10-CM | POA: Diagnosis not present

## 2017-07-18 DIAGNOSIS — Z89511 Acquired absence of right leg below knee: Secondary | ICD-10-CM | POA: Diagnosis not present

## 2017-07-18 DIAGNOSIS — S91302S Unspecified open wound, left foot, sequela: Secondary | ICD-10-CM | POA: Diagnosis not present

## 2017-07-18 DIAGNOSIS — M79661 Pain in right lower leg: Secondary | ICD-10-CM | POA: Diagnosis not present

## 2017-07-22 DIAGNOSIS — N183 Chronic kidney disease, stage 3 (moderate): Secondary | ICD-10-CM | POA: Diagnosis not present

## 2017-07-22 DIAGNOSIS — E119 Type 2 diabetes mellitus without complications: Secondary | ICD-10-CM | POA: Diagnosis not present

## 2017-07-22 DIAGNOSIS — B20 Human immunodeficiency virus [HIV] disease: Secondary | ICD-10-CM | POA: Diagnosis not present

## 2017-07-22 DIAGNOSIS — Z89511 Acquired absence of right leg below knee: Secondary | ICD-10-CM | POA: Diagnosis not present

## 2017-07-23 DIAGNOSIS — E11621 Type 2 diabetes mellitus with foot ulcer: Secondary | ICD-10-CM | POA: Diagnosis not present

## 2017-07-23 DIAGNOSIS — B20 Human immunodeficiency virus [HIV] disease: Secondary | ICD-10-CM | POA: Diagnosis not present

## 2017-07-23 DIAGNOSIS — N183 Chronic kidney disease, stage 3 (moderate): Secondary | ICD-10-CM | POA: Diagnosis not present

## 2017-07-23 DIAGNOSIS — I10 Essential (primary) hypertension: Secondary | ICD-10-CM | POA: Diagnosis not present

## 2017-07-24 ENCOUNTER — Emergency Department (HOSPITAL_COMMUNITY)
Admission: EM | Admit: 2017-07-24 | Discharge: 2017-07-24 | Disposition: A | Discharge status: Home / Self Care | Payer: Medicare Other | Attending: Emergency Medicine | Admitting: Emergency Medicine

## 2017-07-24 ENCOUNTER — Other Ambulatory Visit: Payer: Self-pay

## 2017-07-24 ENCOUNTER — Encounter (HOSPITAL_COMMUNITY): Payer: Self-pay | Admitting: *Deleted

## 2017-07-24 DIAGNOSIS — N183 Chronic kidney disease, stage 3 (moderate): Secondary | ICD-10-CM | POA: Diagnosis not present

## 2017-07-24 DIAGNOSIS — Z21 Asymptomatic human immunodeficiency virus [HIV] infection status: Secondary | ICD-10-CM | POA: Insufficient documentation

## 2017-07-24 DIAGNOSIS — T8131XA Disruption of external operation (surgical) wound, not elsewhere classified, initial encounter: Secondary | ICD-10-CM | POA: Diagnosis not present

## 2017-07-24 DIAGNOSIS — W19XXXA Unspecified fall, initial encounter: Secondary | ICD-10-CM

## 2017-07-24 DIAGNOSIS — T8130XA Disruption of wound, unspecified, initial encounter: Secondary | ICD-10-CM

## 2017-07-24 DIAGNOSIS — I129 Hypertensive chronic kidney disease with stage 1 through stage 4 chronic kidney disease, or unspecified chronic kidney disease: Secondary | ICD-10-CM | POA: Diagnosis not present

## 2017-07-24 DIAGNOSIS — E119 Type 2 diabetes mellitus without complications: Secondary | ICD-10-CM | POA: Diagnosis not present

## 2017-07-24 DIAGNOSIS — Y658 Other specified misadventures during surgical and medical care: Secondary | ICD-10-CM | POA: Insufficient documentation

## 2017-07-24 DIAGNOSIS — Z794 Long term (current) use of insulin: Secondary | ICD-10-CM | POA: Insufficient documentation

## 2017-07-24 DIAGNOSIS — Z79899 Other long term (current) drug therapy: Secondary | ICD-10-CM | POA: Insufficient documentation

## 2017-07-24 MED ORDER — HYDROCODONE-ACETAMINOPHEN 5-325 MG PO TABS
1.0000 | ORAL_TABLET | Freq: Once | ORAL | Status: AC
  Administered 2017-07-24: 1 via ORAL
  Filled 2017-07-24: qty 1

## 2017-07-24 MED ORDER — AMOXICILLIN-POT CLAVULANATE 875-125 MG PO TABS: 1.0000 | ORAL_TABLET | Freq: Two times a day (BID) | ORAL | 0 refills | Status: DC

## 2017-07-24 NOTE — Care Management (Signed)
CM discussed the referral with [facility] liaison, DME order for w/c  will need to be correctly entered by EDP with DME note. CM spoke with patient about someone picking up the w/c if he is to be transported by [facility]. Patient then stated that he will use a rolling walker instead. CM contacted [employer], who stated patient was discharged today with [facility] and orders for w/c vs rolling walker which were faxed to [facility].  Patient was reminded that a nurse from [facility] would be contacting him at verified number patient verbalized understanding teach back done. Updated Dr.Kohut and Devin RN on Pod C

## 2017-07-24 NOTE — ED Notes (Signed)
Pt changed, IV removed and pt awaiting clothes from family and discharge.

## 2017-07-24 NOTE — ED Provider Notes (Signed)
[facility] EMERGENCY DEPARTMENT Provider Note   CSN: [id number] Arrival date & time: 07/24/17  1350     History   Chief Complaint Chief Complaint  Patient presents with  . Fall     HPI   Blood pressure 104/77, pulse 78, temperature 99 F (37.2 C), temperature source Oral, resp. rate 18, SpO2 98 %.  Keith [name] is a 52 y.o. male  with a history of HIV on ART (last CD4 80, undetectable VL 04/2017), T2DM, HTN, stage III CKD complaining of pain and bleeding to right lower extremity BKA  (3/12 by Dr. Cathi Roan falling while getting out of the car and using his crutches.  There was initially pulsatile bleeding however this ceased.  He states pain is severe, he is requesting admission to the hospital to prevent infection.  He denies any head trauma, other pain or injuries that occurred during the fall.  He did not lose consciousness.  Patient states that when he fell he defecated on himself, he states that the HIV medications give him diarrhea, no associated back pain, he has had these issues with fecal incontinence in the past.  Tetanus up-to-date per chart review   Past Medical History:  Diagnosis Date  . AIDS ([place])   . Chronic knee pain    right  . Chronic pain   . Diabetes type 2, uncontrolled ([place])    HgA1c 17.6 (04/27/2010)  . Diabetic foot ulcer ([place]) 01/2017   right foot  . Erectile dysfunction   . Genital warts   . HIV (human immunodeficiency virus infection) ([place]) 2009   CD4 count 100, VL 13800 (05/01/2010)  . Hypertension   . Osteomyelitis ([place])    h/o hand  . Osteomyelitis of metatarsal ([facility]) 04/28/2017    Patient Active Problem List   Diagnosis Date Noted  . Below knee amputation status, right ([place]) 06/13/2017  . S/P transmetatarsal amputation of foot, right ([place]) 05/07/2017  . Wound infection   . Bacteremia   . Fever   . Abscess of right foot   . Sepsis ([place]) 03/15/2017  . CKD (chronic kidney disease) stage 3, GFR 30-59 ml/min (HCC)  03/15/2017  . Diabetic polyneuropathy associated with type 2 diabetes mellitus ([place]) 01/23/2017  . Right foot ulcer, limited to breakdown of skin ([place]) 01/07/2017  . Dehydration 09/29/2016  . AKI (acute kidney injury) ([place]) 09/28/2016  . Diarrhea with dehydration 09/28/2016  . Nausea vomiting and diarrhea 09/28/2016  . Onychomycosis of multiple toenails with type 2 diabetes mellitus ([place]) 08/29/2015  . MRSA carrier 04/20/2014  . Testosterone deficiency 04/20/2014  . Genital warts 02/22/2014  . HIV disease ([place])   . Insulin-requiring or dependent type II diabetes mellitus ([place]) 02/04/2014  . Cough 05/04/2013  . Dental anomaly 11/20/2012  . Arthritis of right knee 02/23/2012  . Hyperlipidemia 11/10/2011  . Chronic pain 08/07/2011  . Meralgia paraesthetica 04/23/2011  . ERECTILE DYSFUNCTION 08/22/2008  . Essential hypertension 05/19/2008    Past Surgical History:  Procedure Laterality Date  . AMPUTATION Right 05/02/2017   Procedure: AMPUTATION TRANSMETARSAL;  Surgeon: [name], MD;  Location: [facility];  Service: Orthopedics;  Laterality: Right;  . AMPUTATION Right 06/13/2017   Procedure: RIGHT BELOW KNEE AMPUTATION;  Surgeon: [name], MD;  Location: [facility];  Service: Orthopedics;  Laterality: Right;  . BELOW KNEE LEG AMPUTATION Right 06/13/2017  . HERNIA REPAIR    . I&D EXTREMITY Left 08/21/2014   Procedure: INCISION AND DRAINAGE LEFT SMALL FINGER;  Surgeon: Leanora Cover, MD;  Location: [facility];  Service: Orthopedics;  Laterality: Left;  . I&D EXTREMITY Right 03/18/2017   Procedure: IRRIGATION AND DEBRIDEMENT EXTREMITY;  Surgeon: [name], MD;  Location: [facility];  Service: Orthopedics;  Laterality: Right;  . MINOR IRRIGATION AND DEBRIDEMENT OF WOUND Right 04/22/2014   Procedure: IRRIGATION AND DEBRIDEMENT OF RIGHT NECK ABCESS;  Surgeon: Jerrell Belfast, MD;  Location: [facility];  Service: ENT;  Laterality: Right;  . MULTIPLE EXTRACTIONS WITH ALVEOLOPLASTY N/A 01/18/2013    Procedure: MULTIPLE EXTRACION 3, 6, 7, 10, 11, 13, 21, 22, 27, 28, 29, 30 WITH ALVEOLOPLASTY;  Surgeon: Gae Bon, DDS;  Location: [facility];  Service: Oral Surgery;  Laterality: N/A;  . SKIN SPLIT GRAFT Right 03/21/2017   Procedure: IRRIGATION AND DEBRIDEMENT RIGHT FOOT AND APPLY SPLIT THICKNESS SKIN GRAFT AND WOUND VAC;  Surgeon: [name], MD;  Location: [facility];  Service: Orthopedics;  Laterality: Right;  . TEE WITHOUT CARDIOVERSION N/A 05/02/2017   Procedure: TRANSESOPHAGEAL ECHOCARDIOGRAM (TEE);  Surgeon: Acie Fredrickson Wonda Cheng, MD;  Location: [facility] OR;  Service: Cardiovascular;  Laterality: N/A;  coincidental to orthopedic case        Home Medications    Prior to Admission medications   Medication Sig Start Date End Date Taking? Authorizing Provider  abacavir-dolutegravir-lamiVUDine (TRIUMEQ) 600-50-300 MG tablet Take 1 tablet by mouth daily. Patient taking differently: Take 1 tablet by mouth at bedtime.  01/20/17  Yes [name], MD  amLODipine (NORVASC) 10 MG tablet Take 1 tablet (10 mg total) by mouth daily. Patient taking differently: Take 10 mg by mouth at bedtime.  01/17/17   Nita Sells, MD  amoxicillin-clavulanate (AUGMENTIN) 875-125 MG tablet Take 1 tablet by mouth 2 (two) times daily. One tab po bid x 10 days 07/24/17   Anguel Delapena, Elmyra Ricks, PA-C  cyclobenzaprine (FLEXERIL) 10 MG tablet Take 1 tablet (10 mg total) by mouth 2 (two) times daily as needed for muscle spasms. 10/25/16   Ashley Murrain, NP  dapsone 100 MG tablet Take 1 tablet (100 mg total) by mouth daily. 05/07/17   Patrecia Pour, MD  darunavir-cobicistat (PREZCOBIX) 800-150 MG tablet Take 1 tablet by mouth daily. Swallow whole. Do NOT crush, break or chew tablets. Take with food. 01/20/17   [name], MD  gabapentin (NEURONTIN) 300 MG capsule Take 1 capsule (300 mg total) by mouth 3 (three) times daily. 06/17/17   [name], MD  glimepiride (AMARYL) 1 MG tablet Take 1 tablet (1 mg total) by mouth  every morning. Patient taking differently: Take 1 mg by mouth daily.  01/16/17 01/16/18  Nita Sells, MD  Insulin Glargine (LANTUS SOLOSTAR) 100 UNIT/ML Solostar Pen Inject 20 Units into the skin daily at 10 pm. 01/16/17   Nita Sells, MD  lisinopril (PRINIVIL,ZESTRIL) 5 MG tablet Take 5 mg by mouth daily. 04/23/17   [provider]  LORazepam (ATIVAN) 1 MG tablet Take 1 tablet (1 mg total) by mouth every 6 (six) hours as needed for anxiety. 01/30/15   [name], MD  naloxone [facility]) nasal spray 4 mg/0.1 mL Place 1 spray into the nose daily as needed (opioid overdose).    [provider]  Oxycodone HCl 10 MG TABS Take 1 tablet (10 mg total) by mouth 3 (three) times daily as needed. Patient taking differently: Take 10 mg by mouth 3 (three) times daily as needed (for pain).  06/17/17   [name], MD  polyethylene glycol [facility] /  GLYCOLAX) packet Take 17 g by mouth daily. 05/07/17   Patrecia Pour, MD    Family History Family History  Problem Relation Age of Onset  . Hypertension Mother   . Arthritis Father   . Hypertension Father   . Hypertension Brother   . Cancer Maternal Grandmother 36       unknown type of cancer  . Depression Paternal Grandmother     Social History Social History   Tobacco Use  . Smoking status: Never Smoker  . Smokeless tobacco: Never Used  Substance Use Topics  . Alcohol use: No    Alcohol/week: 0.0 oz  . Drug use: No     Allergies   Sulfa antibiotics   Review of Systems Review of Systems  A complete review of systems was obtained and all systems are negative except as noted in the HPI and PMH.   Physical Exam Updated Vital Signs BP 113/80   Pulse 91   Temp 99 F (37.2 C) (Oral)   Resp 18   SpO2 99%   Physical Exam  Constitutional: He is oriented to person, place, and time. He appears well-developed and well-nourished. No distress.  HENT:  Head: Normocephalic and atraumatic.  Mouth/Throat:  Oropharynx is clear and moist.  Eyes: Pupils are equal, round, and reactive to light. Conjunctivae and EOM are normal.  Neck: Normal range of motion.  Cardiovascular: Normal rate, regular rhythm and intact distal pulses.  Pulmonary/Chest: Effort normal and breath sounds normal.  Abdominal: Soft. There is no tenderness.  Musculoskeletal: Normal range of motion.  BKA on right side with scant amount of wound dehiscence on the medial and lateral aspect, no pulsatile bleeding, wound is clean  Neurological: He is alert and oriented to person, place, and time.  Skin: He is not diaphoretic.  Psychiatric: He has a normal mood and affect.  Nursing note and vitals reviewed.         ED Treatments / Results  Labs (all labs ordered are listed, but only abnormal results are displayed) Labs Reviewed - No data to display  EKG None  Radiology No results found.  Procedures Procedures (including critical care time)  Medications Ordered in ED Medications  HYDROcodone-acetaminophen (NORCO/VICODIN) 5-325 MG per tablet 1 tablet (has no administration in time range)     Initial Impression / Assessment and Plan / ED Course  I have reviewed the triage vital signs and the nursing notes.  Pertinent labs & imaging results that were available during my care of the patient were reviewed by me and considered in my medical decision making (see chart for details).     Vitals:   07/24/17 1358 07/24/17 1400 07/24/17 1430 07/24/17 1500  BP: 104/77 118/80 110/75 113/80  Pulse: 78 87 88 91  Resp: 18     Temp: 99 F (37.2 C)     TempSrc: Oral     SpO2: 98% 98% 98% 99%    Medications  HYDROcodone-acetaminophen (NORCO/VICODIN) 5-325 MG per tablet 1 tablet (has no administration in time range)    Keith [name] is 52 y.o. male presenting with mild wound dehiscence status post fall he was just released from rehab today he is status post BKA by due to approximately 1 month ago.  Tetanus updated, no  pulsatile bleeding.  Wound is cleaned and dressed with Xeroform  Orthopedic consult from [name] appreciated: He has evaluated the photos of the wound dehiscence and agrees that this patient is appropriate for discharge.  He has  personally instructed the patient via phone how to dress the wound.  Case management is consulted so we can arrange for rolling walker and/or wheelchair for this patient.  Case signed out to PA Geiple at shift change: Plan is to follow-up home health needs and discharge home with Augmentin   Final Clinical Impressions(s) / ED Diagnoses   Final diagnoses:  Fall, initial encounter  Wound dehiscence    ED Discharge Orders        Ordered    amoxicillin-clavulanate (AUGMENTIN) 875-125 MG tablet  2 times daily     07/24/17 1612       Jennet Scroggin, Charna Elizabeth 07/24/17 1614    Malvin Johns, MD 07/25/17 [id number]

## 2017-07-24 NOTE — Discharge Instructions (Signed)
Wash the affected area with soap and water and apply a thin layer of topical antibiotic ointment. Do this every 12 hours.   Do not use rubbing alcohol or hydrogen peroxide.                        Look for signs of infection: if you see redness, if the area becomes warm, if pain increases sharply, there is discharge (pus), if red streaks appear or you develop fever or vomiting, RETURN immediately to the Emergency Department  for a recheck.   Dress the wound as instructed by [name].

## 2017-07-24 NOTE — Care Management (Signed)
ED CM received consult form EDP concerning patient who was recently discharged from [facility] s/p Rt. BKA.  Patient states that he has fallen several times since discharge. No significant finding on ED evaluation. W/C and rolling walker ordered. CM met with patient who states he is unable to get anyone today to pick him up so he prefers  a w/c. CM offered choice [facility] selected. Referral faxed to [facility].

## 2017-07-24 NOTE — ED Triage Notes (Signed)
Patient was discharged from [facility]. Today after left BKA,. 1 month ago., states he was using a walker and wheelchair in rehab. Today was trying to use crutches and fell. C/o pain in stump. bleeing noted on dressing. C/o dizziness when standing. Denies hitting his head.

## 2017-07-24 NOTE — ED Notes (Signed)
Pt had BM and was cleaned up. Wound dressed per EDP order

## 2017-07-24 NOTE — ED Notes (Signed)
EMT Jeneen Rinks and EMT Lysbeth Galas cleaned up pt and pt had large loose bowel movement.  Pts brief changed and cleaned up

## 2017-07-27 DIAGNOSIS — L02611 Cutaneous abscess of right foot: Secondary | ICD-10-CM | POA: Diagnosis not present

## 2017-07-27 DIAGNOSIS — I129 Hypertensive chronic kidney disease with stage 1 through stage 4 chronic kidney disease, or unspecified chronic kidney disease: Secondary | ICD-10-CM | POA: Diagnosis not present

## 2017-07-27 DIAGNOSIS — N183 Chronic kidney disease, stage 3 (moderate): Secondary | ICD-10-CM | POA: Diagnosis not present

## 2017-07-27 DIAGNOSIS — E1122 Type 2 diabetes mellitus with diabetic chronic kidney disease: Secondary | ICD-10-CM | POA: Diagnosis not present

## 2017-07-27 DIAGNOSIS — B20 Human immunodeficiency virus [HIV] disease: Secondary | ICD-10-CM | POA: Diagnosis not present

## 2017-07-27 DIAGNOSIS — Z4781 Encounter for orthopedic aftercare following surgical amputation: Secondary | ICD-10-CM | POA: Diagnosis not present

## 2017-07-28 ENCOUNTER — Telehealth (INDEPENDENT_AMBULATORY_CARE_PROVIDER_SITE_OTHER): Payer: Self-pay | Admitting: *Deleted

## 2017-07-28 NOTE — Telephone Encounter (Signed)
Received call from [place] at home and stated pt went to ED on 07/24/17 d/t a fall getting out of his car and had reinjured his wound and ED had rewrapped his leg, now she says it is bleeding and would like for pt to have it evaluated to make sure no infection. I advised her of opening for tomorrow with Sharol Given and she requested I call pt to inform him of the appt but would like a call if [name] is changing his tx by calling her cell phone at [phone]. I called pt and advised him of his appt for Thurs April 23 at 10 with MD.

## 2017-07-29 ENCOUNTER — Ambulatory Visit (INDEPENDENT_AMBULATORY_CARE_PROVIDER_SITE_OTHER): Payer: Medicare Other | Admitting: Orthopedic Surgery

## 2017-07-29 ENCOUNTER — Encounter (INDEPENDENT_AMBULATORY_CARE_PROVIDER_SITE_OTHER): Payer: Self-pay | Admitting: Orthopedic Surgery

## 2017-07-29 DIAGNOSIS — N183 Chronic kidney disease, stage 3 (moderate): Secondary | ICD-10-CM | POA: Diagnosis not present

## 2017-07-29 DIAGNOSIS — E1122 Type 2 diabetes mellitus with diabetic chronic kidney disease: Secondary | ICD-10-CM | POA: Diagnosis not present

## 2017-07-29 DIAGNOSIS — I129 Hypertensive chronic kidney disease with stage 1 through stage 4 chronic kidney disease, or unspecified chronic kidney disease: Secondary | ICD-10-CM | POA: Diagnosis not present

## 2017-07-29 DIAGNOSIS — Z4781 Encounter for orthopedic aftercare following surgical amputation: Secondary | ICD-10-CM | POA: Diagnosis not present

## 2017-07-29 DIAGNOSIS — B20 Human immunodeficiency virus [HIV] disease: Secondary | ICD-10-CM | POA: Diagnosis not present

## 2017-07-29 DIAGNOSIS — L02611 Cutaneous abscess of right foot: Secondary | ICD-10-CM | POA: Diagnosis not present

## 2017-07-29 DIAGNOSIS — Z89511 Acquired absence of right leg below knee: Secondary | ICD-10-CM

## 2017-07-29 NOTE — Progress Notes (Signed)
Office Visit Note   Patient: Keith [name]           Date of Birth: Aug 17, 1965           MRN: [id number] Visit Date: 07/29/2017              Requested by: Scot Jun, [place], [place] [id number] PCP: Scot Jun, FNP  Chief Complaint  Patient presents with  . Right Leg - Routine Post Op    06/13/17 right BKA       HPI: Patient is a 51 year old gentleman status post right transtibial amputation.  Patient has had a fall he went to the emergency room on 07/24/2017.  Patient states that he was discharged to home without a walker but he did get one from the emergency room.  He states that advanced home care was coming out to his home but he needs visits 3 times a week.  Patient is currently drinking a Coke.  Assessment & Plan: Visit Diagnoses:  1. Acquired absence of right leg below knee ([place])     Plan: Patient was given instructions and demonstrated elevation in his leg needs to be elevated level with his heart.  He needs to wash this daily with soap and water apply 4 x 4 plus the Ace wrap plus the stump shrinker.  We will follow-up closely.  Follow-Up Instructions: Return in about 1 week (around 08/05/2017).   Ortho Exam  Patient is alert, oriented, no adenopathy, well-dressed, normal affect, normal respiratory effort. Examination patient is ambulating in a wheelchair.  His leg is dependent there is increased swelling in the right lower extremity there is increased dehiscence of the medial and lateral incisions.  The medial incision is gaped open approximately 2 cm in diameter 1 cm deep the lateral incision is 1 x 2 cm and 5 mm deep.  There is good granulation tissue there is no cellulitis no odor no signs of infection.  Imaging: No results found. No images are attached to the encounter.  Labs: Lab Results  Component Value Date   HGBA1C 7.4 (H) 06/13/2017   HGBA1C 10.1 (H) 03/15/2017   HGBA1C 8.2 01/01/2017   ESRSEDRATE 140 (H) 04/28/2017   ESRSEDRATE 127 (H) 03/15/2017   ESRSEDRATE 94 (H) 01/07/2017   CRP 14.9 (H) 04/28/2017   CRP 23.1 (H) 03/15/2017   CRP 1.8 (H) 01/08/2017   LABURIC 6.3 08/07/2009   REPTSTATUS 05/08/2017 FINAL 05/03/2017   GRAMSTAIN  04/29/2017    NO WBC SEEN RARE GRAM POSITIVE COCCI IN PAIRS IN SINGLES    CULT NO GROWTH 5 DAYS 05/03/2017   LABORGA METHICILLIN RESISTANT STAPHYLOCOCCUS AUREUS 04/29/2017    @LABSALLVALUES (HGBA1)@  There is no height or weight on file to calculate BMI.  Orders:  No orders of the defined types were placed in this encounter.  No orders of the defined types were placed in this encounter.    Procedures: No procedures performed  Clinical Data: No additional findings.  ROS:  All other systems negative, except as noted in the HPI. Review of Systems  Objective: Vital Signs: There were no vitals taken for this visit.  Specialty Comments:  No specialty comments available.  PMFS History: Patient Active Problem List   Diagnosis Date Noted  . Below knee amputation status, right ([place]) 06/13/2017  . S/P transmetatarsal amputation of foot, right ([place]) 05/07/2017  . Wound infection   . Bacteremia   . Fever   . Abscess of right foot   .  Sepsis ([place]) 03/15/2017  . CKD (chronic kidney disease) stage 3, GFR 30-59 ml/min (HCC) 03/15/2017  . Diabetic polyneuropathy associated with type 2 diabetes mellitus ([place]) 01/23/2017  . Right foot ulcer, limited to breakdown of skin ([place]) 01/07/2017  . Dehydration 09/29/2016  . AKI (acute kidney injury) ([place]) 09/28/2016  . Diarrhea with dehydration 09/28/2016  . Nausea vomiting and diarrhea 09/28/2016  . Onychomycosis of multiple toenails with type 2 diabetes mellitus ([place]) 08/29/2015  . MRSA carrier 04/20/2014  . Testosterone deficiency 04/20/2014  . Genital warts 02/22/2014  . HIV disease ([place])   . Insulin-requiring or dependent type II diabetes mellitus ([place]) 02/04/2014  . Cough 05/04/2013  . Dental anomaly 11/20/2012    . Arthritis of right knee 02/23/2012  . Hyperlipidemia 11/10/2011  . Chronic pain 08/07/2011  . Meralgia paraesthetica 04/23/2011  . ERECTILE DYSFUNCTION 08/22/2008  . Essential hypertension 05/19/2008   Past Medical History:  Diagnosis Date  . AIDS ([place])   . Chronic knee pain    right  . Chronic pain   . Diabetes type 2, uncontrolled ([place])    HgA1c 17.6 (04/27/2010)  . Diabetic foot ulcer ([place]) 01/2017   right foot  . Erectile dysfunction   . Genital warts   . HIV (human immunodeficiency virus infection) ([place]) 2009   CD4 count 100, VL 13800 (05/01/2010)  . Hypertension   . Osteomyelitis ([place])    h/o hand  . Osteomyelitis of metatarsal ([place]) 04/28/2017    Family History  Problem Relation Age of Onset  . Hypertension Mother   . Arthritis Father   . Hypertension Father   . Hypertension Brother   . Cancer Maternal Grandmother 43       unknown type of cancer  . Depression Paternal Grandmother     Past Surgical History:  Procedure Laterality Date  . AMPUTATION Right 05/02/2017   Procedure: AMPUTATION TRANSMETARSAL;  Surgeon: [name], MD;  Location: [facility];  Service: Orthopedics;  Laterality: Right;  . AMPUTATION Right 06/13/2017   Procedure: RIGHT BELOW KNEE AMPUTATION;  Surgeon: [name], MD;  Location: [facility];  Service: Orthopedics;  Laterality: Right;  . BELOW KNEE LEG AMPUTATION Right 06/13/2017  . HERNIA REPAIR    . I&D EXTREMITY Left 08/21/2014   Procedure: INCISION AND DRAINAGE LEFT SMALL FINGER;  Surgeon: Leanora Cover, MD;  Location: [facility];  Service: Orthopedics;  Laterality: Left;  . I&D EXTREMITY Right 03/18/2017   Procedure: IRRIGATION AND DEBRIDEMENT EXTREMITY;  Surgeon: [name], MD;  Location: [facility];  Service: Orthopedics;  Laterality: Right;  . MINOR IRRIGATION AND DEBRIDEMENT OF WOUND Right 04/22/2014   Procedure: IRRIGATION AND DEBRIDEMENT OF RIGHT NECK ABCESS;  Surgeon: Jerrell Belfast, MD;  Location: [facility];  Service: ENT;  Laterality: Right;   . MULTIPLE EXTRACTIONS WITH ALVEOLOPLASTY N/A 01/18/2013   Procedure: MULTIPLE EXTRACION 3, 6, 7, 10, 11, 13, 21, 22, 27, 28, 29, 30 WITH ALVEOLOPLASTY;  Surgeon: Gae Bon, DDS;  Location: [facility];  Service: Oral Surgery;  Laterality: N/A;  . SKIN SPLIT GRAFT Right 03/21/2017   Procedure: IRRIGATION AND DEBRIDEMENT RIGHT FOOT AND APPLY SPLIT THICKNESS SKIN GRAFT AND WOUND VAC;  Surgeon: [name], MD;  Location: [facility];  Service: Orthopedics;  Laterality: Right;  . TEE WITHOUT CARDIOVERSION N/A 05/02/2017   Procedure: TRANSESOPHAGEAL ECHOCARDIOGRAM (TEE);  Surgeon: Acie Fredrickson Wonda Cheng, MD;  Location: [facility] OR;  Service: Cardiovascular;  Laterality: N/A;  coincidental to orthopedic case   Social History  Occupational History  . Not on file  Tobacco Use  . Smoking status: Never Smoker  . Smokeless tobacco: Never Used  Substance and Sexual Activity  . Alcohol use: No    Alcohol/week: 0.0 oz  . Drug use: No  . Sexual activity: Yes    Partners: Female    Comment: given condoms

## 2017-07-29 NOTE — Progress Notes (Signed)
Patient is seen in follow-up for transtibial amputation.  Dressing was changed follow-up in 1 week.

## 2017-07-30 ENCOUNTER — Telehealth (INDEPENDENT_AMBULATORY_CARE_PROVIDER_SITE_OTHER): Payer: Self-pay

## 2017-07-30 NOTE — Telephone Encounter (Signed)
Called and sw Richardson Landry PT to advise verbal ok for physical therapy orders.

## 2017-07-30 NOTE — Telephone Encounter (Signed)
Daleen Bo, PT with [facility] would like verbal orders for HHPT for 2 x week for 4 weeks for patient.  Cb# is [phone].  Please advise.  Thank you.

## 2017-07-31 DIAGNOSIS — L02611 Cutaneous abscess of right foot: Secondary | ICD-10-CM | POA: Diagnosis not present

## 2017-07-31 DIAGNOSIS — Z4781 Encounter for orthopedic aftercare following surgical amputation: Secondary | ICD-10-CM | POA: Diagnosis not present

## 2017-07-31 DIAGNOSIS — E1122 Type 2 diabetes mellitus with diabetic chronic kidney disease: Secondary | ICD-10-CM | POA: Diagnosis not present

## 2017-07-31 DIAGNOSIS — B20 Human immunodeficiency virus [HIV] disease: Secondary | ICD-10-CM | POA: Diagnosis not present

## 2017-07-31 DIAGNOSIS — I129 Hypertensive chronic kidney disease with stage 1 through stage 4 chronic kidney disease, or unspecified chronic kidney disease: Secondary | ICD-10-CM | POA: Diagnosis not present

## 2017-07-31 DIAGNOSIS — N183 Chronic kidney disease, stage 3 (moderate): Secondary | ICD-10-CM | POA: Diagnosis not present

## 2017-08-01 ENCOUNTER — Telehealth (INDEPENDENT_AMBULATORY_CARE_PROVIDER_SITE_OTHER): Payer: Self-pay | Admitting: Orthopedic Surgery

## 2017-08-01 DIAGNOSIS — L02611 Cutaneous abscess of right foot: Secondary | ICD-10-CM | POA: Diagnosis not present

## 2017-08-01 DIAGNOSIS — N183 Chronic kidney disease, stage 3 (moderate): Secondary | ICD-10-CM | POA: Diagnosis not present

## 2017-08-01 DIAGNOSIS — E1122 Type 2 diabetes mellitus with diabetic chronic kidney disease: Secondary | ICD-10-CM | POA: Diagnosis not present

## 2017-08-01 DIAGNOSIS — I129 Hypertensive chronic kidney disease with stage 1 through stage 4 chronic kidney disease, or unspecified chronic kidney disease: Secondary | ICD-10-CM | POA: Diagnosis not present

## 2017-08-01 DIAGNOSIS — B20 Human immunodeficiency virus [HIV] disease: Secondary | ICD-10-CM | POA: Diagnosis not present

## 2017-08-01 DIAGNOSIS — Z4781 Encounter for orthopedic aftercare following surgical amputation: Secondary | ICD-10-CM | POA: Diagnosis not present

## 2017-08-01 NOTE — Telephone Encounter (Signed)
Called and advised on for OT orders. Pain medication was not given by [name] narcotic log shows that the pt did have a recent rx for pain medication through pain management and that is where he will have to get rx from. To call with any questions.

## 2017-08-01 NOTE — Telephone Encounter (Signed)
Jarrett Soho, PT, from [facility] called requesting VO for the following:  2x a week for 4 weeks.  Patient contacted his pain management doctor to get them to call the pharmacy to get his medication refilled, but they will not do that until he sees [name] or when the pain management doctor speaks to [name]. Patient told her that he has was coming to our office to get medication.  Jarrett Soho stated that the pharmacy will not fill his prescription because of possible medication abuse.   [facility] CB#[phone]

## 2017-08-04 DIAGNOSIS — N183 Chronic kidney disease, stage 3 (moderate): Secondary | ICD-10-CM | POA: Diagnosis not present

## 2017-08-04 DIAGNOSIS — B20 Human immunodeficiency virus [HIV] disease: Secondary | ICD-10-CM | POA: Diagnosis not present

## 2017-08-04 DIAGNOSIS — L02611 Cutaneous abscess of right foot: Secondary | ICD-10-CM | POA: Diagnosis not present

## 2017-08-04 DIAGNOSIS — I129 Hypertensive chronic kidney disease with stage 1 through stage 4 chronic kidney disease, or unspecified chronic kidney disease: Secondary | ICD-10-CM | POA: Diagnosis not present

## 2017-08-04 DIAGNOSIS — Z4781 Encounter for orthopedic aftercare following surgical amputation: Secondary | ICD-10-CM | POA: Diagnosis not present

## 2017-08-04 DIAGNOSIS — E1122 Type 2 diabetes mellitus with diabetic chronic kidney disease: Secondary | ICD-10-CM | POA: Diagnosis not present

## 2017-08-05 DIAGNOSIS — M47816 Spondylosis without myelopathy or radiculopathy, lumbar region: Secondary | ICD-10-CM | POA: Diagnosis not present

## 2017-08-05 DIAGNOSIS — B20 Human immunodeficiency virus [HIV] disease: Secondary | ICD-10-CM | POA: Diagnosis not present

## 2017-08-05 DIAGNOSIS — Z79891 Long term (current) use of opiate analgesic: Secondary | ICD-10-CM | POA: Diagnosis not present

## 2017-08-05 DIAGNOSIS — I129 Hypertensive chronic kidney disease with stage 1 through stage 4 chronic kidney disease, or unspecified chronic kidney disease: Secondary | ICD-10-CM | POA: Diagnosis not present

## 2017-08-05 DIAGNOSIS — Z4781 Encounter for orthopedic aftercare following surgical amputation: Secondary | ICD-10-CM | POA: Diagnosis not present

## 2017-08-05 DIAGNOSIS — G894 Chronic pain syndrome: Secondary | ICD-10-CM | POA: Diagnosis not present

## 2017-08-05 DIAGNOSIS — L02611 Cutaneous abscess of right foot: Secondary | ICD-10-CM | POA: Diagnosis not present

## 2017-08-05 DIAGNOSIS — G629 Polyneuropathy, unspecified: Secondary | ICD-10-CM | POA: Diagnosis not present

## 2017-08-05 DIAGNOSIS — E1122 Type 2 diabetes mellitus with diabetic chronic kidney disease: Secondary | ICD-10-CM | POA: Diagnosis not present

## 2017-08-05 DIAGNOSIS — N183 Chronic kidney disease, stage 3 (moderate): Secondary | ICD-10-CM | POA: Diagnosis not present

## 2017-08-05 DIAGNOSIS — Z79899 Other long term (current) drug therapy: Secondary | ICD-10-CM | POA: Diagnosis not present

## 2017-08-07 DIAGNOSIS — Z4781 Encounter for orthopedic aftercare following surgical amputation: Secondary | ICD-10-CM | POA: Diagnosis not present

## 2017-08-07 DIAGNOSIS — E1122 Type 2 diabetes mellitus with diabetic chronic kidney disease: Secondary | ICD-10-CM | POA: Diagnosis not present

## 2017-08-07 DIAGNOSIS — N183 Chronic kidney disease, stage 3 (moderate): Secondary | ICD-10-CM | POA: Diagnosis not present

## 2017-08-07 DIAGNOSIS — I129 Hypertensive chronic kidney disease with stage 1 through stage 4 chronic kidney disease, or unspecified chronic kidney disease: Secondary | ICD-10-CM | POA: Diagnosis not present

## 2017-08-07 DIAGNOSIS — L02611 Cutaneous abscess of right foot: Secondary | ICD-10-CM | POA: Diagnosis not present

## 2017-08-07 DIAGNOSIS — B20 Human immunodeficiency virus [HIV] disease: Secondary | ICD-10-CM | POA: Diagnosis not present

## 2017-08-11 ENCOUNTER — Ambulatory Visit (INDEPENDENT_AMBULATORY_CARE_PROVIDER_SITE_OTHER): Payer: Medicare Other | Admitting: Orthopedic Surgery

## 2017-08-11 ENCOUNTER — Encounter (INDEPENDENT_AMBULATORY_CARE_PROVIDER_SITE_OTHER): Payer: Self-pay | Admitting: Orthopedic Surgery

## 2017-08-11 VITALS — Ht 69.0 in | Wt 243.0 lb

## 2017-08-11 DIAGNOSIS — Z89511 Acquired absence of right leg below knee: Secondary | ICD-10-CM

## 2017-08-11 NOTE — Progress Notes (Signed)
Office Visit Note   Patient: Keith [name]           Date of Birth: 25-Dec-1965           MRN: [id number] Visit Date: 08/11/2017              Requested by: Scot Jun, [place], [place] [address] PCP: Scot Jun, FNP  Chief Complaint  Patient presents with  . Right Leg - Routine Post Op    06/13/17 right BKA      HPI: Patient is a 52 year old gentleman who is about 2 months status post right transtibial amputation.  He is currently wearing a 4 extra-large stump shrinker this is loose.  He still has swelling he has completed his course of antibiotics.  He is drinking a Coke.  Assessment & Plan: Visit Diagnoses:  1. Acquired absence of right leg below knee (HCC)     Plan: Recommended drinking water.  Recommend he go by [employer] to get a 3 extra-large stump shrinker.  He should wear this directly against the skin discussed that with decreased swelling the superficial ulcers should completely heal.  Follow-Up Instructions: Return in about 2 weeks (around 08/25/2017).   Ortho Exam  Patient is alert, oriented, no adenopathy, well-dressed, normal affect, normal respiratory effort. Examination patient has some superficial ulcers medially and laterally.  The swelling is decreasing a little.  There is no redness no cellulitis no drainage no odor no signs of infection.  Imaging: No results found. No images are attached to the encounter.  Labs: Lab Results  Component Value Date   HGBA1C 7.4 (H) 06/13/2017   HGBA1C 10.1 (H) 03/15/2017   HGBA1C 8.2 01/01/2017   ESRSEDRATE 140 (H) 04/28/2017   ESRSEDRATE 127 (H) 03/15/2017   ESRSEDRATE 94 (H) 01/07/2017   CRP 14.9 (H) 04/28/2017   CRP 23.1 (H) 03/15/2017   CRP 1.8 (H) 01/08/2017   LABURIC 6.3 08/07/2009   REPTSTATUS 05/08/2017 FINAL 05/03/2017   GRAMSTAIN  04/29/2017    NO WBC SEEN RARE GRAM POSITIVE COCCI IN PAIRS IN SINGLES    CULT NO GROWTH 5 DAYS 05/03/2017   LABORGA METHICILLIN RESISTANT  STAPHYLOCOCCUS AUREUS 04/29/2017    Lab Results  Component Value Date/Time   HGBA1C 7.4 (H) 06/13/2017 01:18 PM   HGBA1C 10.1 (H) 03/15/2017 03:39 PM   HGBA1C 8.2 01/01/2017 11:05 AM   HGBA1C 8.5 10/24/2016 12:24 PM   HGBA1C 13.2 (H) 06/04/2016 05:32 AM    Body mass index is 35.88 kg/m.  Orders:  No orders of the defined types were placed in this encounter.  No orders of the defined types were placed in this encounter.    Procedures: No procedures performed  Clinical Data: No additional findings.  ROS:  All other systems negative, except as noted in the HPI. Review of Systems  Objective: Vital Signs: Ht 5\' 9"  (1.753 m)   Wt 243 lb (110.2 kg)   BMI 35.88 kg/m   Specialty Comments:  No specialty comments available.  PMFS History: Patient Active Problem List   Diagnosis Date Noted  . Below knee amputation status, right ([place]) 06/13/2017  . S/P transmetatarsal amputation of foot, right ([place]) 05/07/2017  . Wound infection   . Bacteremia   . Fever   . Abscess of right foot   . Sepsis ([place]) 03/15/2017  . CKD (chronic kidney disease) stage 3, GFR 30-59 ml/min (HCC) 03/15/2017  . Diabetic polyneuropathy associated with type 2 diabetes mellitus ([place])  01/23/2017  . Right foot ulcer, limited to breakdown of skin ([place]) 01/07/2017  . Dehydration 09/29/2016  . AKI (acute kidney injury) ([place]) 09/28/2016  . Diarrhea with dehydration 09/28/2016  . Nausea vomiting and diarrhea 09/28/2016  . Onychomycosis of multiple toenails with type 2 diabetes mellitus ([place]) 08/29/2015  . MRSA carrier 04/20/2014  . Testosterone deficiency 04/20/2014  . Genital warts 02/22/2014  . HIV disease ([place])   . Insulin-requiring or dependent type II diabetes mellitus ([place]) 02/04/2014  . Cough 05/04/2013  . Dental anomaly 11/20/2012  . Arthritis of right knee 02/23/2012  . Hyperlipidemia 11/10/2011  . Chronic pain 08/07/2011  . Meralgia paraesthetica 04/23/2011  . ERECTILE DYSFUNCTION  08/22/2008  . Essential hypertension 05/19/2008   Past Medical History:  Diagnosis Date  . AIDS ([place])   . Chronic knee pain    right  . Chronic pain   . Diabetes type 2, uncontrolled ([place])    HgA1c 17.6 (04/27/2010)  . Diabetic foot ulcer ([place]) 01/2017   right foot  . Erectile dysfunction   . Genital warts   . HIV (human immunodeficiency virus infection) ([place]) 2009   CD4 count 100, VL 13800 (05/01/2010)  . Hypertension   . Osteomyelitis ([place])    h/o hand  . Osteomyelitis of metatarsal ([place]) 04/28/2017    Family History  Problem Relation Age of Onset  . Hypertension Mother   . Arthritis Father   . Hypertension Father   . Hypertension Brother   . Cancer Maternal Grandmother 54       unknown type of cancer  . Depression Paternal Grandmother     Past Surgical History:  Procedure Laterality Date  . AMPUTATION Right 05/02/2017   Procedure: AMPUTATION TRANSMETARSAL;  Surgeon: [name], MD;  Location: [facility];  Service: Orthopedics;  Laterality: Right;  . AMPUTATION Right 06/13/2017   Procedure: RIGHT BELOW KNEE AMPUTATION;  Surgeon: [name], MD;  Location: [facility];  Service: Orthopedics;  Laterality: Right;  . BELOW KNEE LEG AMPUTATION Right 06/13/2017  . HERNIA REPAIR    . I&D EXTREMITY Left 08/21/2014   Procedure: INCISION AND DRAINAGE LEFT SMALL FINGER;  Surgeon: Leanora Cover, MD;  Location: [facility];  Service: Orthopedics;  Laterality: Left;  . I&D EXTREMITY Right 03/18/2017   Procedure: IRRIGATION AND DEBRIDEMENT EXTREMITY;  Surgeon: [name], MD;  Location: [facility];  Service: Orthopedics;  Laterality: Right;  . MINOR IRRIGATION AND DEBRIDEMENT OF WOUND Right 04/22/2014   Procedure: IRRIGATION AND DEBRIDEMENT OF RIGHT NECK ABCESS;  Surgeon: Jerrell Belfast, MD;  Location: [facility];  Service: ENT;  Laterality: Right;  . MULTIPLE EXTRACTIONS WITH ALVEOLOPLASTY N/A 01/18/2013   Procedure: MULTIPLE EXTRACION 3, 6, 7, 10, 11, 13, 21, 22, 27, 28, 29, 30 WITH ALVEOLOPLASTY;   Surgeon: Gae Bon, DDS;  Location: [facility];  Service: Oral Surgery;  Laterality: N/A;  . SKIN SPLIT GRAFT Right 03/21/2017   Procedure: IRRIGATION AND DEBRIDEMENT RIGHT FOOT AND APPLY SPLIT THICKNESS SKIN GRAFT AND WOUND VAC;  Surgeon: [name], MD;  Location: [facility];  Service: Orthopedics;  Laterality: Right;  . TEE WITHOUT CARDIOVERSION N/A 05/02/2017   Procedure: TRANSESOPHAGEAL ECHOCARDIOGRAM (TEE);  Surgeon: Acie Fredrickson Wonda Cheng, MD;  [facility];  Service: Cardiovascular;  Laterality: N/A;  coincidental to orthopedic case   Social History   Occupational History  . Not on file  Tobacco Use  . Smoking status: Never Smoker  . Smokeless tobacco: Never Used  Substance and Sexual Activity  .  Alcohol use: No    Alcohol/week: 0.0 oz  . Drug use: No  . Sexual activity: Yes    Partners: Female    Comment: given condoms

## 2017-08-12 DIAGNOSIS — E1122 Type 2 diabetes mellitus with diabetic chronic kidney disease: Secondary | ICD-10-CM | POA: Diagnosis not present

## 2017-08-12 DIAGNOSIS — B20 Human immunodeficiency virus [HIV] disease: Secondary | ICD-10-CM | POA: Diagnosis not present

## 2017-08-12 DIAGNOSIS — N183 Chronic kidney disease, stage 3 (moderate): Secondary | ICD-10-CM | POA: Diagnosis not present

## 2017-08-12 DIAGNOSIS — Z4781 Encounter for orthopedic aftercare following surgical amputation: Secondary | ICD-10-CM | POA: Diagnosis not present

## 2017-08-12 DIAGNOSIS — I129 Hypertensive chronic kidney disease with stage 1 through stage 4 chronic kidney disease, or unspecified chronic kidney disease: Secondary | ICD-10-CM | POA: Diagnosis not present

## 2017-08-12 DIAGNOSIS — L02611 Cutaneous abscess of right foot: Secondary | ICD-10-CM | POA: Diagnosis not present

## 2017-08-13 ENCOUNTER — Telehealth (INDEPENDENT_AMBULATORY_CARE_PROVIDER_SITE_OTHER): Payer: Self-pay

## 2017-08-13 DIAGNOSIS — Z4781 Encounter for orthopedic aftercare following surgical amputation: Secondary | ICD-10-CM | POA: Diagnosis not present

## 2017-08-13 DIAGNOSIS — B20 Human immunodeficiency virus [HIV] disease: Secondary | ICD-10-CM | POA: Diagnosis not present

## 2017-08-13 DIAGNOSIS — L02611 Cutaneous abscess of right foot: Secondary | ICD-10-CM | POA: Diagnosis not present

## 2017-08-13 DIAGNOSIS — E1122 Type 2 diabetes mellitus with diabetic chronic kidney disease: Secondary | ICD-10-CM | POA: Diagnosis not present

## 2017-08-13 DIAGNOSIS — N183 Chronic kidney disease, stage 3 (moderate): Secondary | ICD-10-CM | POA: Diagnosis not present

## 2017-08-13 DIAGNOSIS — I129 Hypertensive chronic kidney disease with stage 1 through stage 4 chronic kidney disease, or unspecified chronic kidney disease: Secondary | ICD-10-CM | POA: Diagnosis not present

## 2017-08-13 NOTE — Telephone Encounter (Signed)
If patient has drainage and odor then we need to see him in the office Thursday.

## 2017-08-13 NOTE — Telephone Encounter (Signed)
Keith [name] with [facility] would like to clarify wound care orders for patient.  Stated that patient's wound is draining and has an odor.  Would prefer for patient to have a dressing over the wound due to his health and others, instead of just wearing the stump shrinker against the skin.  Cb# is [phone].  Please advise.  Thank you.

## 2017-08-13 NOTE — Telephone Encounter (Signed)
See note, please advise?  I see the note that says wear it against skin, but she says it is now draining and has an odor and it did not when he was in.

## 2017-08-14 ENCOUNTER — Ambulatory Visit (INDEPENDENT_AMBULATORY_CARE_PROVIDER_SITE_OTHER): Payer: Medicare Other | Admitting: Orthopedic Surgery

## 2017-08-14 DIAGNOSIS — E1122 Type 2 diabetes mellitus with diabetic chronic kidney disease: Secondary | ICD-10-CM | POA: Diagnosis not present

## 2017-08-14 DIAGNOSIS — Z4781 Encounter for orthopedic aftercare following surgical amputation: Secondary | ICD-10-CM | POA: Diagnosis not present

## 2017-08-14 DIAGNOSIS — I129 Hypertensive chronic kidney disease with stage 1 through stage 4 chronic kidney disease, or unspecified chronic kidney disease: Secondary | ICD-10-CM | POA: Diagnosis not present

## 2017-08-14 DIAGNOSIS — N183 Chronic kidney disease, stage 3 (moderate): Secondary | ICD-10-CM | POA: Diagnosis not present

## 2017-08-14 DIAGNOSIS — B20 Human immunodeficiency virus [HIV] disease: Secondary | ICD-10-CM | POA: Diagnosis not present

## 2017-08-14 DIAGNOSIS — L02611 Cutaneous abscess of right foot: Secondary | ICD-10-CM | POA: Diagnosis not present

## 2017-08-14 NOTE — Telephone Encounter (Signed)
IC patient and LMVM to call me, need to see him in office today.  IC Kelly with [facility] also and LMVM advising her and asking if she could help [place] get in touch with patient as well.

## 2017-08-14 NOTE — Telephone Encounter (Signed)
Please advise 

## 2017-08-14 NOTE — Telephone Encounter (Signed)
IC and sw Kelly and she stated the odor has gone but still has some drainage but not as bad as it was yesterday, says the wound is looking good and has improved and feels like it will get better thru out the weekend. Did want to let us know that she wrapped it with rolled gauze with 3 layers and ace wrap and is seeping thru the wrap and pt is wearing compression stockings. Wants to know if still wants to continue with wound care, the wound care comes and does it daily and uses xeroform and gauze and the wound has closed up some today. Doesn't see where pt needs to come in now unless you think he does. Please advise.

## 2017-08-14 NOTE — Telephone Encounter (Signed)
Call patient, recommend continue home health wound care until this has completely healed.

## 2017-08-14 NOTE — Telephone Encounter (Signed)
Please call for me?  Claiborne Billings with [facility], [phone]- still need to see patient today if possible

## 2017-08-15 DIAGNOSIS — E1122 Type 2 diabetes mellitus with diabetic chronic kidney disease: Secondary | ICD-10-CM | POA: Diagnosis not present

## 2017-08-15 DIAGNOSIS — L02611 Cutaneous abscess of right foot: Secondary | ICD-10-CM | POA: Diagnosis not present

## 2017-08-15 DIAGNOSIS — I129 Hypertensive chronic kidney disease with stage 1 through stage 4 chronic kidney disease, or unspecified chronic kidney disease: Secondary | ICD-10-CM | POA: Diagnosis not present

## 2017-08-15 DIAGNOSIS — Z4781 Encounter for orthopedic aftercare following surgical amputation: Secondary | ICD-10-CM | POA: Diagnosis not present

## 2017-08-15 DIAGNOSIS — N183 Chronic kidney disease, stage 3 (moderate): Secondary | ICD-10-CM | POA: Diagnosis not present

## 2017-08-15 DIAGNOSIS — B20 Human immunodeficiency virus [HIV] disease: Secondary | ICD-10-CM | POA: Diagnosis not present

## 2017-08-15 NOTE — Telephone Encounter (Signed)
IC Kelly and LMVM advising to continue wound care until healed, and to call us if more orders needed.

## 2017-08-18 ENCOUNTER — Telehealth (INDEPENDENT_AMBULATORY_CARE_PROVIDER_SITE_OTHER): Payer: Self-pay | Admitting: Orthopedic Surgery

## 2017-08-18 DIAGNOSIS — L02611 Cutaneous abscess of right foot: Secondary | ICD-10-CM | POA: Diagnosis not present

## 2017-08-18 DIAGNOSIS — Z4781 Encounter for orthopedic aftercare following surgical amputation: Secondary | ICD-10-CM | POA: Diagnosis not present

## 2017-08-18 DIAGNOSIS — B20 Human immunodeficiency virus [HIV] disease: Secondary | ICD-10-CM | POA: Diagnosis not present

## 2017-08-18 DIAGNOSIS — I129 Hypertensive chronic kidney disease with stage 1 through stage 4 chronic kidney disease, or unspecified chronic kidney disease: Secondary | ICD-10-CM | POA: Diagnosis not present

## 2017-08-18 DIAGNOSIS — N183 Chronic kidney disease, stage 3 (moderate): Secondary | ICD-10-CM | POA: Diagnosis not present

## 2017-08-18 DIAGNOSIS — E1122 Type 2 diabetes mellitus with diabetic chronic kidney disease: Secondary | ICD-10-CM | POA: Diagnosis not present

## 2017-08-18 NOTE — Telephone Encounter (Signed)
[facility]  [phone]    Extended physical therapy orders  Twice a week for five weeks

## 2017-08-18 NOTE — Telephone Encounter (Signed)
Called and lm on vm to advise ok for extended orders as requested. To call with questions.

## 2017-08-19 ENCOUNTER — Encounter (HOSPITAL_COMMUNITY): Payer: Self-pay | Admitting: Emergency Medicine

## 2017-08-19 ENCOUNTER — Other Ambulatory Visit: Payer: Self-pay

## 2017-08-19 ENCOUNTER — Emergency Department (HOSPITAL_COMMUNITY)
Admission: EM | Admit: 2017-08-19 | Discharge: 2017-08-20 | Disposition: A | Discharge status: Home / Self Care | Payer: Medicare Other | Attending: Emergency Medicine | Admitting: Emergency Medicine

## 2017-08-19 DIAGNOSIS — E119 Type 2 diabetes mellitus without complications: Secondary | ICD-10-CM | POA: Insufficient documentation

## 2017-08-19 DIAGNOSIS — B20 Human immunodeficiency virus [HIV] disease: Secondary | ICD-10-CM | POA: Diagnosis not present

## 2017-08-19 DIAGNOSIS — N183 Chronic kidney disease, stage 3 (moderate): Secondary | ICD-10-CM | POA: Diagnosis not present

## 2017-08-19 DIAGNOSIS — Z79899 Other long term (current) drug therapy: Secondary | ICD-10-CM | POA: Diagnosis not present

## 2017-08-19 DIAGNOSIS — Z4781 Encounter for orthopedic aftercare following surgical amputation: Secondary | ICD-10-CM | POA: Diagnosis not present

## 2017-08-19 DIAGNOSIS — L02611 Cutaneous abscess of right foot: Secondary | ICD-10-CM | POA: Diagnosis not present

## 2017-08-19 DIAGNOSIS — I129 Hypertensive chronic kidney disease with stage 1 through stage 4 chronic kidney disease, or unspecified chronic kidney disease: Secondary | ICD-10-CM | POA: Insufficient documentation

## 2017-08-19 DIAGNOSIS — E1122 Type 2 diabetes mellitus with diabetic chronic kidney disease: Secondary | ICD-10-CM | POA: Diagnosis not present

## 2017-08-19 DIAGNOSIS — I10 Essential (primary) hypertension: Secondary | ICD-10-CM | POA: Diagnosis not present

## 2017-08-19 LAB — CBC WITH DIFFERENTIAL/PLATELET
Abs Immature Granulocytes: 0 10*3/uL (ref 0.0–0.1)
Basophils Absolute: 0 10*3/uL (ref 0.0–0.1)
Basophils Relative: 0 %
Eosinophils Absolute: 0.1 10*3/uL (ref 0.0–0.7)
Eosinophils Relative: 2 %
HCT: 32.3 % — ABNORMAL LOW (ref 39.0–52.0)
Hemoglobin: 10 g/dL — ABNORMAL LOW (ref 13.0–17.0)
Immature Granulocytes: 0 %
Lymphocytes Relative: 35 %
Lymphs Abs: 1.9 10*3/uL (ref 0.7–4.0)
MCH: 26.4 pg (ref 26.0–34.0)
MCHC: 31 g/dL (ref 30.0–36.0)
MCV: 85.2 fL (ref 78.0–100.0)
Monocytes Absolute: 0.4 10*3/uL (ref 0.1–1.0)
Monocytes Relative: 8 %
Neutro Abs: 3 10*3/uL (ref 1.7–7.7)
Neutrophils Relative %: 55 %
Platelets: 259 10*3/uL (ref 150–400)
RBC: 3.79 MIL/uL — ABNORMAL LOW (ref 4.22–5.81)
RDW: 17 % — ABNORMAL HIGH (ref 11.5–15.5)
WBC: 5.4 10*3/uL (ref 4.0–10.5)

## 2017-08-19 LAB — COMPREHENSIVE METABOLIC PANEL
ALT: 13 U/L — ABNORMAL LOW (ref 17–63)
AST: 18 U/L (ref 15–41)
Albumin: 3.6 g/dL (ref 3.5–5.0)
Alkaline Phosphatase: 73 U/L (ref 38–126)
Anion gap: 9 (ref 5–15)
BUN: 21 mg/dL — ABNORMAL HIGH (ref 6–20)
CO2: 24 mmol/L (ref 22–32)
Calcium: 9.9 mg/dL (ref 8.9–10.3)
Chloride: 107 mmol/L (ref 101–111)
Creatinine, Ser: 1.38 mg/dL — ABNORMAL HIGH (ref 0.61–1.24)
GFR calc Af Amer: >60 mL/min (ref >60)
GFR calc non Af Amer: 57 mL/min — ABNORMAL LOW (ref >60)
Glucose, Bld: 94 mg/dL (ref 65–99)
Potassium: 4.2 mmol/L (ref 3.5–5.1)
Sodium: 140 mmol/L (ref 135–145)
Total Bilirubin: 0.5 mg/dL (ref 0.3–1.2)
Total Protein: 9 g/dL — ABNORMAL HIGH (ref 6.5–8.1)

## 2017-08-19 NOTE — ED Triage Notes (Addendum)
Pt sent in by his home nurse for hypertension. Pt states he has been stressed and worried but wont say about what. Denies SI. No headache no blurred vision, no dizziness. Does not take BP medications on a daily basis. Denies any pain anywhere. Alert and oriented to X4. Denies weakness. Pt is diabetic and states he is just thirsty and states he has not had anything to drink since yesterday.

## 2017-08-19 NOTE — ED Provider Notes (Signed)
[facility] EMERGENCY DEPARTMENT Provider Note   CSN: [id number] Arrival date & time: 08/19/17  9390     History   Chief Complaint Chief Complaint  Patient presents with  . Hypertension    HPI ADE STMARIE is a 52 y.o. male with history of AIDS, osteomyelitis, and diabetes type 2 uncontrolled who presents to the emergency department presents to the emergency department with a chief complaint of elevated blood pressure.  The patient reports that he had his blood pressure taken during a physical therapy appointment this morning with 186/132 in the right upper extremity and 202/146 in the left upper extremity.   He states that he is not currently taking any hypertension medications.  He was previously taking Norvasc but this was stopped several months ago.  He denies headache, visual changes, dizziness, lightheadedness, chest pain, dyspnea, nausea, vomiting, diarrhea, decreased urine, or dysuria.  No treatment prior to arrival.  The history is provided by the patient. No language interpreter was used.    Past Medical History:  Diagnosis Date  . AIDS ([place])   . Chronic knee pain    right  . Chronic pain   . Diabetes type 2, uncontrolled ([place])    HgA1c 17.6 (04/27/2010)  . Diabetic foot ulcer ([place]) 01/2017   right foot  . Erectile dysfunction   . Genital warts   . HIV (human immunodeficiency virus infection) ([place]) 2009   CD4 count 100, VL 13800 (05/01/2010)  . Hypertension   . Osteomyelitis ([place])    h/o hand  . Osteomyelitis of metatarsal ([place]) 04/28/2017    Patient Active Problem List   Diagnosis Date Noted  . Below knee amputation status, right ([place]) 06/13/2017  . S/P transmetatarsal amputation of foot, right ([place]) 05/07/2017  . Wound infection   . Bacteremia   . Fever   . Abscess of right foot   . Sepsis ([place]) 03/15/2017  . CKD (chronic kidney disease) stage 3, GFR 30-59 ml/min (HCC) 03/15/2017  . Diabetic polyneuropathy associated with type 2  diabetes mellitus ([place]) 01/23/2017  . Right foot ulcer, limited to breakdown of skin ([place]) 01/07/2017  . Dehydration 09/29/2016  . AKI (acute kidney injury) ([place]) 09/28/2016  . Diarrhea with dehydration 09/28/2016  . Nausea vomiting and diarrhea 09/28/2016  . Onychomycosis of multiple toenails with type 2 diabetes mellitus ([place]) 08/29/2015  . MRSA carrier 04/20/2014  . Testosterone deficiency 04/20/2014  . Genital warts 02/22/2014  . HIV disease ([place])   . Insulin-requiring or dependent type II diabetes mellitus ([place]) 02/04/2014  . Cough 05/04/2013  . Dental anomaly 11/20/2012  . Arthritis of right knee 02/23/2012  . Hyperlipidemia 11/10/2011  . Chronic pain 08/07/2011  . Meralgia paraesthetica 04/23/2011  . ERECTILE DYSFUNCTION 08/22/2008  . Essential hypertension 05/19/2008    Past Surgical History:  Procedure Laterality Date  . AMPUTATION Right 05/02/2017   Procedure: AMPUTATION TRANSMETARSAL;  Surgeon: [name], MD;  Location: [facility];  Service: Orthopedics;  Laterality: Right;  . AMPUTATION Right 06/13/2017   Procedure: RIGHT BELOW KNEE AMPUTATION;  Surgeon: [name], MD;  Location: [facility];  Service: Orthopedics;  Laterality: Right;  . BELOW KNEE LEG AMPUTATION Right 06/13/2017  . HERNIA REPAIR    . I&D EXTREMITY Left 08/21/2014   Procedure: INCISION AND DRAINAGE LEFT SMALL FINGER;  Surgeon: Leanora Cover, MD;  Location: [facility];  Service: Orthopedics;  Laterality: Left;  . I&D EXTREMITY Right 03/18/2017   Procedure: IRRIGATION AND DEBRIDEMENT EXTREMITY;  Surgeon: Sharol Given,  Illene Regulus, MD;  Location: [facility];  Service: Orthopedics;  Laterality: Right;  . MINOR IRRIGATION AND DEBRIDEMENT OF WOUND Right 04/22/2014   Procedure: IRRIGATION AND DEBRIDEMENT OF RIGHT NECK ABCESS;  Surgeon: Jerrell Belfast, MD;  Location: [facility];  Service: ENT;  Laterality: Right;  . MULTIPLE EXTRACTIONS WITH ALVEOLOPLASTY N/A 01/18/2013   Procedure: MULTIPLE EXTRACION 3, 6, 7, 10, 11, 13, 21, 22, 27, 28, 29,  30 WITH ALVEOLOPLASTY;  Surgeon: Gae Bon, DDS;  Location: [facility];  Service: Oral Surgery;  Laterality: N/A;  . SKIN SPLIT GRAFT Right 03/21/2017   Procedure: IRRIGATION AND DEBRIDEMENT RIGHT FOOT AND APPLY SPLIT THICKNESS SKIN GRAFT AND WOUND VAC;  Surgeon: [name], MD;  Location: [facility];  Service: Orthopedics;  Laterality: Right;  . TEE WITHOUT CARDIOVERSION N/A 05/02/2017   Procedure: TRANSESOPHAGEAL ECHOCARDIOGRAM (TEE);  Surgeon: Acie Fredrickson Wonda Cheng, MD;  Location[facility];  Service: Cardiovascular;  Laterality: N/A;  coincidental to orthopedic case        Home Medications    Prior to Admission medications   Medication Sig Start Date End Date Taking? Authorizing Provider  abacavir-dolutegravir-lamiVUDine (TRIUMEQ) 600-50-300 MG tablet Take 1 tablet by mouth daily. 01/20/17   [name], MD  acetaminophen (TYLENOL) 500 MG tablet Take 1,000 mg by mouth every 6 (six) hours as needed (for pain).    [provider]  amLODipine (NORVASC) 10 MG tablet Take 1 tablet (10 mg total) by mouth daily. 01/17/17   Nita Sells, MD  amoxicillin-clavulanate (AUGMENTIN) 875-125 MG tablet Take 1 tablet by mouth 2 (two) times daily. One tab po bid x 10 days 07/24/17   Pisciotta, Elmyra Ricks, PA-C  cyclobenzaprine (FLEXERIL) 10 MG tablet Take 1 tablet (10 mg total) by mouth 2 (two) times daily as needed for muscle spasms. 10/25/16   Ashley Murrain, NP  dapsone 100 MG tablet Take 1 tablet (100 mg total) by mouth daily. 05/07/17   Patrecia Pour, MD  darunavir-cobicistat (PREZCOBIX) 800-150 MG tablet Take 1 tablet by mouth daily. Swallow whole. Do NOT crush, break or chew tablets. Take with food. 01/20/17   [name], MD  escitalopram (LEXAPRO) 10 MG tablet Take 10 mg by mouth daily.    [provider]  ferrous sulfate 325 (65 FE) MG tablet Take 325 mg by mouth daily with breakfast.    [provider]  gabapentin (NEURONTIN) 300 MG capsule Take 1 capsule (300 mg  total) by mouth 3 (three) times daily. 06/17/17   [name], MD  glimepiride (AMARYL) 1 MG tablet Take 1 tablet (1 mg total) by mouth every morning. Patient taking differently: Take 1 mg by mouth daily.  01/16/17 01/16/18  Nita Sells, MD  Insulin Glargine (LANTUS SOLOSTAR) 100 UNIT/ML Solostar Pen Inject 20 Units into the skin daily at 10 pm. Patient taking differently: Inject 20 Units into the skin at bedtime.  01/16/17   Nita Sells, MD  lisinopril (PRINIVIL,ZESTRIL) 5 MG tablet Take 5 mg by mouth daily. 04/23/17   [provider]  LORazepam (ATIVAN) 1 MG tablet Take 1 tablet (1 mg total) by mouth every 6 (six) hours as needed for anxiety. 01/30/15   [name], MD  Multiple Vitamins-Minerals (MULTIVITAMIN WITH MINERALS) tablet Take 1 tablet by mouth every evening.    [provider]  naloxone [address] [employer]) nasal spray 4 mg/0.1 mL Place 1 spray into the nose daily as needed (opioid overdose).    [provider]  ondansetron [facility]) 4  MG tablet Take 4 mg by mouth every 6 (six) hours as needed for nausea or vomiting.    [provider]  Oxycodone HCl 10 MG TABS Take 1 tablet (10 mg total) by mouth 3 (three) times daily as needed. Patient taking differently: Take 10 mg by mouth every 6 (six) hours as needed (for pain).  06/17/17   [name], MD  polyethylene glycol [facility] / Floria Raveling) packet Take 17 g by mouth daily. 05/07/17   Patrecia Pour, MD    Family History Family History  Problem Relation Age of Onset  . Hypertension Mother   . Arthritis Father   . Hypertension Father   . Hypertension Brother   . Cancer Maternal Grandmother 43       unknown type of cancer  . Depression Paternal Grandmother     Social History Social History   Tobacco Use  . Smoking status: Never Smoker  . Smokeless tobacco: Never Used  Substance Use Topics  . Alcohol use: No    Alcohol/week: 0.0 oz  . Drug use: No     Allergies   Sulfa  antibiotics   Review of Systems Review of Systems  Constitutional: Negative for appetite change, chills and fever.  HENT: Negative for congestion.   Respiratory: Negative for shortness of breath.   Cardiovascular: Negative for chest pain.  Gastrointestinal: Negative for abdominal pain, diarrhea, nausea and vomiting.  Genitourinary: Negative for dysuria and frequency.  Musculoskeletal: Negative for back pain.  Skin: Negative for rash.  Allergic/Immunologic: Negative for immunocompromised state.  Neurological: Negative for dizziness, syncope, weakness, light-headedness and headaches.  Psychiatric/Behavioral: Negative for confusion.     Physical Exam Updated Vital Signs BP (!) 146/100   Pulse 71   Temp 97.8 F (36.6 C) (Oral)   Resp 18   SpO2 100%   Physical Exam  Constitutional: He appears well-developed.  HENT:  Head: Normocephalic.  Eyes: Pupils are equal, round, and reactive to light. Conjunctivae and EOM are normal.  Neck: Neck supple.  Cardiovascular: Normal rate, regular rhythm, normal heart sounds and intact distal pulses. Exam reveals no gallop and no friction rub.  No murmur heard. Pulmonary/Chest: Effort normal and breath sounds normal. No stridor. No respiratory distress. He has no wheezes. He has no rales. He exhibits no tenderness.  Abdominal: Soft. Bowel sounds are normal. He exhibits no distension and no mass. There is no tenderness. There is no rebound and no guarding. No hernia.  Neurological: He is alert.  Cranial nerves 2-12 intact. Finger-to-nose is normal. 5/5 motor strength of the bilateral upper and lower extremities. Moves all four extremities. NVI.    Skin: Skin is warm and dry.  Psychiatric: His behavior is normal.  Nursing note and vitals reviewed.  ED Treatments / Results  Labs (all labs ordered are listed, but only abnormal results are displayed) Labs Reviewed  CBC WITH DIFFERENTIAL/PLATELET - Abnormal; Notable for the following components:        Result Value   RBC 3.79 (*)    Hemoglobin 10.0 (*)    HCT 32.3 (*)    RDW 17.0 (*)    All other components within normal limits  COMPREHENSIVE METABOLIC PANEL - Abnormal; Notable for the following components:   BUN 21 (*)    Creatinine, Ser 1.38 (*)    Total Protein 9.0 (*)    ALT 13 (*)    GFR calc non Af Amer 57 (*)    All other components within normal limits  EKG None  Radiology No results found.  Procedures Procedures (including critical care time)  Medications Ordered in ED Medications - No data to display   Initial Impression / Assessment and Plan / ED Course  I have reviewed the triage vital signs and the nursing notes.  Pertinent labs & imaging results that were available during my care of the patient were reviewed by me and considered in my medical decision making (see chart for details).     52 year old male  with history of AIDS, osteomyelitis, and diabetes type 2 uncontrolled who presents to the emergency department presents to the emergency department with a chief complaint of elevated blood pressure.  He is asymptomatic at this time.  Review of his chart demonstrates that he was previously taking lisinopril and Norvasc.  He states that these medications were discontinued, but he is unsure who discontinued them and when.  On arrival, patient's blood pressure appears somewhat labile. 128/101 on arrival, trending upward to 183/95 --> 146/108. Manual BP 190/110, but imrpoving to 146/100 at d/c.  Given that the patient is asymptomatic and has been off hypertensive medications for at least several months, recommended following up with his PCP to have his blood pressure rechecked since it has appeared labile today.  The patient was discussed with Dr. Regenia Skeeter, attending physician.  Strict return precautions given.  He is hemodynamically stable and in no acute distress.  He is safe for discharge home at this time with outpatient follow-up.  Final Clinical  Impressions(s) / ED Diagnoses   Final diagnoses:  Asymptomatic hypertension    ED Discharge Orders    None       Joanne Gavel, PA-C 08/19/17 1836    Sherwood Gambler, MD 08/20/17 [phone]

## 2017-08-19 NOTE — Discharge Instructions (Addendum)
Call and schedule a follow-up appointment with your primary care provider to have your blood pressure rechecked.  Sometimes when your blood pressure is high over several days you may need to be restarted on a medicine for high blood pressure.  If you develop new or worsening symptoms including high blood pressure with a headache, chest pain or shortness of breath, if you become unable to pee, or severe abdominal pain, please return to the emergency department for re-evaluation.

## 2017-08-20 DIAGNOSIS — L02611 Cutaneous abscess of right foot: Secondary | ICD-10-CM | POA: Diagnosis not present

## 2017-08-20 DIAGNOSIS — B20 Human immunodeficiency virus [HIV] disease: Secondary | ICD-10-CM | POA: Diagnosis not present

## 2017-08-20 DIAGNOSIS — Z4781 Encounter for orthopedic aftercare following surgical amputation: Secondary | ICD-10-CM | POA: Diagnosis not present

## 2017-08-20 DIAGNOSIS — N183 Chronic kidney disease, stage 3 (moderate): Secondary | ICD-10-CM | POA: Diagnosis not present

## 2017-08-20 DIAGNOSIS — E1122 Type 2 diabetes mellitus with diabetic chronic kidney disease: Secondary | ICD-10-CM | POA: Diagnosis not present

## 2017-08-20 DIAGNOSIS — I129 Hypertensive chronic kidney disease with stage 1 through stage 4 chronic kidney disease, or unspecified chronic kidney disease: Secondary | ICD-10-CM | POA: Diagnosis not present

## 2017-08-22 DIAGNOSIS — N183 Chronic kidney disease, stage 3 (moderate): Secondary | ICD-10-CM | POA: Diagnosis not present

## 2017-08-22 DIAGNOSIS — L02611 Cutaneous abscess of right foot: Secondary | ICD-10-CM | POA: Diagnosis not present

## 2017-08-22 DIAGNOSIS — I129 Hypertensive chronic kidney disease with stage 1 through stage 4 chronic kidney disease, or unspecified chronic kidney disease: Secondary | ICD-10-CM | POA: Diagnosis not present

## 2017-08-22 DIAGNOSIS — B20 Human immunodeficiency virus [HIV] disease: Secondary | ICD-10-CM | POA: Diagnosis not present

## 2017-08-22 DIAGNOSIS — Z4781 Encounter for orthopedic aftercare following surgical amputation: Secondary | ICD-10-CM | POA: Diagnosis not present

## 2017-08-22 DIAGNOSIS — E1122 Type 2 diabetes mellitus with diabetic chronic kidney disease: Secondary | ICD-10-CM | POA: Diagnosis not present

## 2017-08-25 ENCOUNTER — Ambulatory Visit (INDEPENDENT_AMBULATORY_CARE_PROVIDER_SITE_OTHER): Payer: Medicare Other | Admitting: Orthopedic Surgery

## 2017-08-25 ENCOUNTER — Ambulatory Visit: Payer: Medicare Other | Admitting: Family Medicine

## 2017-08-25 ENCOUNTER — Encounter (INDEPENDENT_AMBULATORY_CARE_PROVIDER_SITE_OTHER): Payer: Self-pay | Admitting: Orthopedic Surgery

## 2017-08-25 DIAGNOSIS — L02611 Cutaneous abscess of right foot: Secondary | ICD-10-CM | POA: Diagnosis not present

## 2017-08-25 DIAGNOSIS — E1122 Type 2 diabetes mellitus with diabetic chronic kidney disease: Secondary | ICD-10-CM | POA: Diagnosis not present

## 2017-08-25 DIAGNOSIS — Z89511 Acquired absence of right leg below knee: Secondary | ICD-10-CM

## 2017-08-25 DIAGNOSIS — I129 Hypertensive chronic kidney disease with stage 1 through stage 4 chronic kidney disease, or unspecified chronic kidney disease: Secondary | ICD-10-CM | POA: Diagnosis not present

## 2017-08-25 DIAGNOSIS — B20 Human immunodeficiency virus [HIV] disease: Secondary | ICD-10-CM | POA: Diagnosis not present

## 2017-08-25 DIAGNOSIS — N183 Chronic kidney disease, stage 3 (moderate): Secondary | ICD-10-CM | POA: Diagnosis not present

## 2017-08-25 DIAGNOSIS — Z4781 Encounter for orthopedic aftercare following surgical amputation: Secondary | ICD-10-CM | POA: Diagnosis not present

## 2017-08-25 MED ORDER — DOXYCYCLINE HYCLATE 100 MG PO TABS: 100.0000 mg | ORAL_TABLET | Freq: Two times a day (BID) | ORAL | 0 refills | Status: DC

## 2017-08-25 NOTE — Progress Notes (Signed)
Office Visit Note   Patient: Keith [name]           Date of Birth: 04-06-1966           MRN: [id number] Visit Date: 08/25/2017              Requested by: Scot Jun, [place], [place] [address] PCP: Scot Jun, FNP  Chief Complaint  Patient presents with  . Right Leg - Follow-up, Routine Post Op      HPI: Patient is a 52 year old gentleman who presents status post right transtibial amputation.  He is about 10 weeks status post right transtibial amputation.  Patient states the swelling is decreasing the drainage is decreasing.  Assessment & Plan: Visit Diagnoses:  1. Acquired absence of right leg below knee [facility])     Plan: We will continue with dressing changes continue with the stump shrinker we will call in a prescription for doxycycline.  Follow-Up Instructions: Return in about 1 month (around 09/22/2017).   Ortho Exam  Patient is alert, oriented, no adenopathy, well-dressed, normal affect, normal respiratory effort. Examination patient's wounds continue to improve the medial wound has completely healed laterally he has a wound that is 2 cm in diameter 1 cm deep.  After debridement this has approximately 75% healthy granulation tissue this was touched with silver nitrate.  The 2 more lateral wounds are superficial without drainage.  There is no drainage today.  There is mild odor but no cellulitis.  Imaging: No results found. No images are attached to the encounter.  Labs: Lab Results  Component Value Date   HGBA1C 7.4 (H) 06/13/2017   HGBA1C 10.1 (H) 03/15/2017   HGBA1C 8.2 01/01/2017   ESRSEDRATE 140 (H) 04/28/2017   ESRSEDRATE 127 (H) 03/15/2017   ESRSEDRATE 94 (H) 01/07/2017   CRP 14.9 (H) 04/28/2017   CRP 23.1 (H) 03/15/2017   CRP 1.8 (H) 01/08/2017   LABURIC 6.3 08/07/2009   REPTSTATUS 05/08/2017 FINAL 05/03/2017   GRAMSTAIN  04/29/2017    NO WBC SEEN RARE GRAM POSITIVE COCCI IN PAIRS IN SINGLES    CULT NO GROWTH 5 DAYS  05/03/2017   LABORGA METHICILLIN RESISTANT STAPHYLOCOCCUS AUREUS 04/29/2017     Lab Results  Component Value Date   ALBUMIN 3.6 08/19/2017   ALBUMIN 3.0 (L) 04/28/2017   ALBUMIN 3.0 (L) 01/15/2017   PREALBUMIN 7.4 (L) 03/15/2017   LABURIC 6.3 08/07/2009    There is no height or weight on file to calculate BMI.  Orders:  No orders of the defined types were placed in this encounter.  Meds ordered this encounter  Medications  . doxycycline (VIBRA-TABS) 100 MG tablet    Sig: Take 1 tablet (100 mg total) by mouth 2 (two) times daily.    Dispense:  30 tablet    Refill:  0     Procedures: No procedures performed  Clinical Data: No additional findings.  ROS:  All other systems negative, except as noted in the HPI. Review of Systems  Objective: Vital Signs: There were no vitals taken for this visit.  Specialty Comments:  No specialty comments available.  PMFS History: Patient Active Problem List   Diagnosis Date Noted  . Below knee amputation status, right ([place]) 06/13/2017  . S/P transmetatarsal amputation of foot, right ([place]) 05/07/2017  . Wound infection   . Bacteremia   . Fever   . Abscess of right foot   . Sepsis ([place]) 03/15/2017  . CKD (chronic kidney  disease) stage 3, GFR 30-59 ml/min (HCC) 03/15/2017  . Diabetic polyneuropathy associated with type 2 diabetes mellitus ([place]) 01/23/2017  . Right foot ulcer, limited to breakdown of skin ([place]) 01/07/2017  . Dehydration 09/29/2016  . AKI (acute kidney injury) ([place]) 09/28/2016  . Diarrhea with dehydration 09/28/2016  . Nausea vomiting and diarrhea 09/28/2016  . Onychomycosis of multiple toenails with type 2 diabetes mellitus ([place]) 08/29/2015  . MRSA carrier 04/20/2014  . Testosterone deficiency 04/20/2014  . Genital warts 02/22/2014  . HIV disease ([place])   . Insulin-requiring or dependent type II diabetes mellitus ([place]) 02/04/2014  . Cough 05/04/2013  . Dental anomaly 11/20/2012  . Arthritis of right knee  02/23/2012  . Hyperlipidemia 11/10/2011  . Chronic pain 08/07/2011  . Meralgia paraesthetica 04/23/2011  . ERECTILE DYSFUNCTION 08/22/2008  . Essential hypertension 05/19/2008   Past Medical History:  Diagnosis Date  . AIDS ([place])   . Chronic knee pain    right  . Chronic pain   . Diabetes type 2, uncontrolled ([place])    HgA1c 17.6 (04/27/2010)  . Diabetic foot ulcer ([place]) 01/2017   right foot  . Erectile dysfunction   . Genital warts   . HIV (human immunodeficiency virus infection) ([place]) 2009   CD4 count 100, VL 13800 (05/01/2010)  . Hypertension   . Osteomyelitis ([place])    h/o hand  . Osteomyelitis of metatarsal ([place]) 04/28/2017    Family History  Problem Relation Age of Onset  . Hypertension Mother   . Arthritis Father   . Hypertension Father   . Hypertension Brother   . Cancer Maternal Grandmother 27       unknown type of cancer  . Depression Paternal Grandmother     Past Surgical History:  Procedure Laterality Date  . AMPUTATION Right 05/02/2017   Procedure: AMPUTATION TRANSMETARSAL;  Surgeon: [name], MD;  Location: [facility];  Service: Orthopedics;  Laterality: Right;  . AMPUTATION Right 06/13/2017   Procedure: RIGHT BELOW KNEE AMPUTATION;  Surgeon: [name], MD;  Location: [place];  Service: Orthopedics;  Laterality: Right;  . BELOW KNEE LEG AMPUTATION Right 06/13/2017  . HERNIA REPAIR    . I&D EXTREMITY Left 08/21/2014   Procedure: INCISION AND DRAINAGE LEFT SMALL FINGER;  Surgeon: Leanora Cover, MD;  Location: [facility];  Service: Orthopedics;  Laterality: Left;  . I&D EXTREMITY Right 03/18/2017   Procedure: IRRIGATION AND DEBRIDEMENT EXTREMITY;  Surgeon: [name], MD;  Location: [place];  Service: Orthopedics;  Laterality: Right;  . MINOR IRRIGATION AND DEBRIDEMENT OF WOUND Right 04/22/2014   Procedure: IRRIGATION AND DEBRIDEMENT OF RIGHT NECK ABCESS;  Surgeon: Jerrell Belfast, MD;  Location: [place] [place];  Service: ENT;  Laterality: Right;  . MULTIPLE EXTRACTIONS  WITH ALVEOLOPLASTY N/A 01/18/2013   Procedure: MULTIPLE EXTRACION 3, 6, 7, 10, 11, 13, 21, 22, 27, 28, 29, 30 WITH ALVEOLOPLASTY;  Surgeon: Gae Bon, DDS;  Location: [facility];  Service: Oral Surgery;  Laterality: N/A;  . SKIN SPLIT GRAFT Right 03/21/2017   Procedure: IRRIGATION AND DEBRIDEMENT RIGHT FOOT AND APPLY SPLIT THICKNESS SKIN GRAFT AND WOUND VAC;  Surgeon: [name], MD;  Location: [facility];  Service: Orthopedics;  Laterality: Right;  . TEE WITHOUT CARDIOVERSION N/A 05/02/2017   Procedure: TRANSESOPHAGEAL ECHOCARDIOGRAM (TEE);  Surgeon: Acie Fredrickson Wonda Cheng, MD;  Location[facility];  Service: Cardiovascular;  Laterality: N/A;  coincidental to orthopedic case   Social History   Occupational History  . Not on file  Tobacco  Use  . Smoking status: Never Smoker  . Smokeless tobacco: Never Used  Substance and Sexual Activity  . Alcohol use: No    Alcohol/week: 0.0 oz  . Drug use: No  . Sexual activity: Yes    Partners: Female    Comment: given condoms

## 2017-08-26 DIAGNOSIS — L02611 Cutaneous abscess of right foot: Secondary | ICD-10-CM | POA: Diagnosis not present

## 2017-08-26 DIAGNOSIS — I129 Hypertensive chronic kidney disease with stage 1 through stage 4 chronic kidney disease, or unspecified chronic kidney disease: Secondary | ICD-10-CM | POA: Diagnosis not present

## 2017-08-26 DIAGNOSIS — B20 Human immunodeficiency virus [HIV] disease: Secondary | ICD-10-CM | POA: Diagnosis not present

## 2017-08-26 DIAGNOSIS — Z4781 Encounter for orthopedic aftercare following surgical amputation: Secondary | ICD-10-CM | POA: Diagnosis not present

## 2017-08-26 DIAGNOSIS — E1122 Type 2 diabetes mellitus with diabetic chronic kidney disease: Secondary | ICD-10-CM | POA: Diagnosis not present

## 2017-08-26 DIAGNOSIS — N183 Chronic kidney disease, stage 3 (moderate): Secondary | ICD-10-CM | POA: Diagnosis not present

## 2017-08-27 ENCOUNTER — Encounter: Payer: Self-pay | Admitting: Family Medicine

## 2017-08-27 ENCOUNTER — Telehealth: Payer: Self-pay

## 2017-08-27 ENCOUNTER — Ambulatory Visit (INDEPENDENT_AMBULATORY_CARE_PROVIDER_SITE_OTHER): Payer: Medicare Other | Admitting: Family Medicine

## 2017-08-27 VITALS — BP 150/90 | HR 84 | Temp 98.0°F | Ht 69.0 in

## 2017-08-27 DIAGNOSIS — N183 Chronic kidney disease, stage 3 (moderate): Secondary | ICD-10-CM | POA: Diagnosis not present

## 2017-08-27 DIAGNOSIS — I10 Essential (primary) hypertension: Secondary | ICD-10-CM | POA: Diagnosis not present

## 2017-08-27 DIAGNOSIS — B20 Human immunodeficiency virus [HIV] disease: Secondary | ICD-10-CM | POA: Diagnosis not present

## 2017-08-27 DIAGNOSIS — Z09 Encounter for follow-up examination after completed treatment for conditions other than malignant neoplasm: Secondary | ICD-10-CM

## 2017-08-27 DIAGNOSIS — E119 Type 2 diabetes mellitus without complications: Secondary | ICD-10-CM | POA: Diagnosis not present

## 2017-08-27 DIAGNOSIS — I129 Hypertensive chronic kidney disease with stage 1 through stage 4 chronic kidney disease, or unspecified chronic kidney disease: Secondary | ICD-10-CM | POA: Diagnosis not present

## 2017-08-27 DIAGNOSIS — L97509 Non-pressure chronic ulcer of other part of unspecified foot with unspecified severity: Secondary | ICD-10-CM

## 2017-08-27 DIAGNOSIS — L02611 Cutaneous abscess of right foot: Secondary | ICD-10-CM | POA: Diagnosis not present

## 2017-08-27 DIAGNOSIS — E1122 Type 2 diabetes mellitus with diabetic chronic kidney disease: Secondary | ICD-10-CM | POA: Diagnosis not present

## 2017-08-27 DIAGNOSIS — Z4781 Encounter for orthopedic aftercare following surgical amputation: Secondary | ICD-10-CM | POA: Diagnosis not present

## 2017-08-27 LAB — POCT GLYCOSYLATED HEMOGLOBIN (HGB A1C): Hemoglobin A1C: 5.4 % (ref 4.0–5.6)

## 2017-08-27 NOTE — Progress Notes (Signed)
Subjective:     [name], male   DOB: [birth year], 52 y.o.   MRN: [id number]   PCP: Kathe Becton, NP  Chief Complaint  Patient presents with  . Follow-up    3 month diabetes    HPI  Patient has history of Diabetes, Hypertension, and HIV. He is here today for follow up.   Current Status: Patient states that he is feeling well today. He arrives to [place] today via wheelchair for assistance with ambulation. Since his last office visit, he has had a right below knee amputation in March 2019 because of foot abscess. His surgery was preformed by D.r Meridee Score. He states that his pain is currently managed by Oxycodone and Gabapentin.   He is compliant with taking anti-viral medications for HIV. He states that he takes his anti-hypertensive medications occasionally.   He denies fevers, chills, recent infections, fatigue, weigh loss, and night sweats. He denies visual changes, headaches, dizziness, and falls. He denies cough, shortness of breath, chest pain, and heart palpitations.   He states that his appetite is good. He denies abdominal pain, nausea, vomiting, diarrhea, constipation, and any episodes of bleeding.   He states that his pain is stable today.   Review of Systems  Constitutional: Negative.   HENT: Negative.   Eyes: Negative.   Respiratory: Negative.   Cardiovascular: Negative.   Gastrointestinal: Positive for abdominal distention (Obese).  Endocrine: Negative.   Genitourinary: Negative.   Musculoskeletal: Positive for gait problem.       Right below knee amputation 06/2017.   Skin: Negative.   Allergic/Immunologic: Negative.   Hematological: Negative.   Psychiatric/Behavioral: Negative.     Past Medical History:  Diagnosis Date  . AIDS ([place])   . Chronic knee pain    right  . Chronic pain   . Diabetes type 2, uncontrolled ([place])    HgA1c 17.6 (04/27/2010)  . Diabetic foot ulcer ([place]) 01/2017   right foot  . Erectile dysfunction   . Genital  warts   . HIV (human immunodeficiency virus infection) ([place]) 2009   CD4 count 100, VL 13800 (05/01/2010)  . Hypertension   . Osteomyelitis ([place])    h/o hand  . Osteomyelitis of metatarsal ([place]) 04/28/2017    Family History  Problem Relation Age of Onset  . Hypertension Mother   . Arthritis Father   . Hypertension Father   . Hypertension Brother   . Cancer Maternal Grandmother 65       unknown type of cancer  . Depression Paternal Grandmother    Social History   Socioeconomic History  . Marital status: Single    Spouse name: Not on file  . Number of children: 3  . Years of education: 44  . Highest education level: Not on file  Occupational History  . Not on file  Social Needs  . Financial resource strain: Not on file  . Food insecurity:    Worry: Not on file    Inability: Not on file  . Transportation needs:    Medical: Not on file    Non-medical: Not on file  Tobacco Use  . Smoking status: Never Smoker  . Smokeless tobacco: Never Used  Substance and Sexual Activity  . Alcohol use: No    Alcohol/week: 0.0 oz  . Drug use: No  . Sexual activity: Yes    Partners: Female    Comment: given condoms  Lifestyle  . Physical activity:    Days per week: Not on  file    Minutes per session: Not on file  . Stress: Not on file  Relationships  . Social connections:    Talks on phone: Not on file    Gets together: Not on file    Attends religious service: Not on file    Active member of club or organization: Not on file    Attends meetings of clubs or organizations: Not on file    Relationship status: Not on file  . Intimate partner violence:    Fear of current or ex partner: Not on file    Emotionally abused: Not on file    Physically abused: Not on file    Forced sexual activity: Not on file  Other Topics Concern  . Not on file  Social History Narrative   Worked for the city of [place] for 18 years.   Unemployed.    Applying for disability.   Medicaid patient.     Past Surgical History:  Procedure Laterality Date  . AMPUTATION Right 05/02/2017   Procedure: AMPUTATION TRANSMETARSAL;  Surgeon: [name], MD;  Location: [facility];  Service: Orthopedics;  Laterality: Right;  . AMPUTATION Right 06/13/2017   Procedure: RIGHT BELOW KNEE AMPUTATION;  Surgeon: [name], MD;  Location: [facility];  Service: Orthopedics;  Laterality: Right;  . BELOW KNEE LEG AMPUTATION Right 06/13/2017  . HERNIA REPAIR    . I&D EXTREMITY Left 08/21/2014   Procedure: INCISION AND DRAINAGE LEFT SMALL FINGER;  Surgeon: Leanora Cover, MD;  Location: [facility] [place];  Service: Orthopedics;  Laterality: Left;  . I&D EXTREMITY Right 03/18/2017   Procedure: IRRIGATION AND DEBRIDEMENT EXTREMITY;  Surgeon: [name], MD;  Location: [facility];  Service: Orthopedics;  Laterality: Right;  . MINOR IRRIGATION AND DEBRIDEMENT OF WOUND Right 04/22/2014   Procedure: IRRIGATION AND DEBRIDEMENT OF RIGHT NECK ABCESS;  Surgeon: Jerrell Belfast, MD;  Location: [facility];  Service: ENT;  Laterality: Right;  . MULTIPLE EXTRACTIONS WITH ALVEOLOPLASTY N/A 01/18/2013   Procedure: MULTIPLE EXTRACION 3, 6, 7, 10, 11, 13, 21, 22, 27, 28, 29, 30 WITH ALVEOLOPLASTY;  Surgeon: Gae Bon, DDS;  Location: [facility];  Service: Oral Surgery;  Laterality: N/A;  . SKIN SPLIT GRAFT Right 03/21/2017   Procedure: IRRIGATION AND DEBRIDEMENT RIGHT FOOT AND APPLY SPLIT THICKNESS SKIN GRAFT AND WOUND VAC;  Surgeon: [name], MD;  Location: [place];  Service: Orthopedics;  Laterality: Right;  . TEE WITHOUT CARDIOVERSION N/A 05/02/2017   Procedure: TRANSESOPHAGEAL ECHOCARDIOGRAM (TEE);  Surgeon: Acie Fredrickson Wonda Cheng, MD;  Location: [facility];  Service: Cardiovascular;  Laterality: N/A;  coincidental to orthopedic case   Immunization History  Administered Date(s) Administered  . Hepatitis B 05/19/2008  . Hepatitis B, adult 06/02/2013, 07/06/2014  . Influenza Split 04/23/2011  . Influenza Whole 03/24/2008, 01/05/2009, 04/18/2010  .  Influenza,inj,Quad PF,6+ Mos 12/18/2012, 02/05/2014, 01/30/2015, 05/13/2016, 03/16/2017  . Meningococcal Mcv4o 05/13/2016  . Pneumococcal Polysaccharide-23 09/08/2006, 11/20/2012, 03/16/2017  . Tdap 05/30/2014    Current Meds  Medication Sig  . abacavir-dolutegravir-lamiVUDine (TRIUMEQ) 600-50-300 MG tablet Take 1 tablet by mouth daily.  Marland Kitchen amLODipine (NORVASC) 10 MG tablet Take 1 tablet (10 mg total) by mouth daily.  . dapsone 100 MG tablet Take 1 tablet (100 mg total) by mouth daily.  . darunavir-cobicistat (PREZCOBIX) 800-150 MG tablet Take 1 tablet by mouth daily. Swallow whole. Do NOT crush, break or chew tablets. Take with food.  . doxycycline (VIBRA-TABS) 100 MG tablet Take 1 tablet (100 mg total) by  mouth 2 (two) times daily.  Marland Kitchen escitalopram (LEXAPRO) 10 MG tablet Take 10 mg by mouth daily.  . ferrous sulfate 325 (65 FE) MG tablet Take 325 mg by mouth daily with breakfast.  . gabapentin (NEURONTIN) 300 MG capsule Take 1 capsule (300 mg total) by mouth 3 (three) times daily.  Marland Kitchen glimepiride (AMARYL) 1 MG tablet Take 1 tablet (1 mg total) by mouth every morning. (Patient taking differently: Take 1 mg by mouth daily. )  . Insulin Glargine (LANTUS SOLOSTAR) 100 UNIT/ML Solostar Pen Inject 20 Units into the skin daily at 10 pm. (Patient taking differently: Inject 20 Units into the skin at bedtime. )  . lisinopril (PRINIVIL,ZESTRIL) 5 MG tablet Take 5 mg by mouth daily.  Marland Kitchen LORazepam (ATIVAN) 1 MG tablet Take 1 tablet (1 mg total) by mouth every 6 (six) hours as needed for anxiety.  . Oxycodone HCl 10 MG TABS Take 1 tablet (10 mg total) by mouth 3 (three) times daily as needed. (Patient taking differently: Take 10 mg by mouth every 6 (six) hours as needed (for pain). )   Allergies  Allergen Reactions  . Sulfa Antibiotics Itching    BP (!) 150/90 (BP Location: Left Arm, Patient Position: Sitting, Cuff Size: Large)   Pulse 84   Temp 98 F (36.7 C) (Oral)   Ht 5\' 9"  (1.753 m)   SpO2 96%    BMI 35.88 kg/m    Objective:  Physical Exam  Constitutional: He is oriented to person, place, and time and well-developed, well-nourished, and in no distress.  HENT:  Head: Normocephalic and atraumatic.  Neck: Normal range of motion. Neck supple.  Cardiovascular: Normal rate, regular rhythm and normal heart sounds.  Pulmonary/Chest: Effort normal and breath sounds normal.  Abdominal: Soft. He exhibits distension (Obese).  Musculoskeletal: Normal range of motion.  Right below knee amputation--06/2017  Neurological: He is alert and oriented to person, place, and time. Gait normal.  Skin: Skin is warm and dry.  Psychiatric: Mood, memory, affect and judgment normal.  Nursing note and vitals reviewed.   Assessment:   1. Type 2 diabetes mellitus without complication, without long-term current use of insulin ([place]) 2. Erectile dysfunction due to diabetes mellitus ([place]) 3. Hypertension, unspecified type 4. HIV disease ([place]) 5. Ulcer of foot, unspecified laterality, unspecified ulcer stage ([place]) 6. Follow up  Plan:  1. Type 2 diabetes mellitus without complication, without long-term current use of insulin (HCC) Hgb A1c is improved normal at 5.4 today, from 7.4 on 06/17/2017. Last Glucose on 08/19/2017 was normal at 94. - POCT glycosylated hemoglobin (Hb A1C)  2. Hypertension, unspecified type Blood pressure is elevated at 150/90 today. He is reminded to take Amlodipine and Lisinopril daily as directed.  3. HIV disease ([place]) He will continue anti-viral medications as directed. Continue to see Dr. [name] in Infectious Disease as needed.   4. Ulcer of foot, unspecified laterality, unspecified ulcer stage ([place]) He is s/p: Right below knee amputation 05/2017 by [name]. His pain is stable today. He will continue to follow up with surgeon.   5. Follow up He will follow up in 3 months.   No orders of the defined types were placed in this encounter.   Kathe Becton,  MSN,  FNP-BC Patient [facility] [address] [place], [place] [address] [phone]

## 2017-08-27 NOTE — Telephone Encounter (Signed)
CHW called stating that patient blood pressure was 180/120. Patient is suppose to be on his way to a appointment with [place] at 10am.

## 2017-08-28 DIAGNOSIS — Z4781 Encounter for orthopedic aftercare following surgical amputation: Secondary | ICD-10-CM | POA: Diagnosis not present

## 2017-08-28 DIAGNOSIS — I129 Hypertensive chronic kidney disease with stage 1 through stage 4 chronic kidney disease, or unspecified chronic kidney disease: Secondary | ICD-10-CM | POA: Diagnosis not present

## 2017-08-28 DIAGNOSIS — B20 Human immunodeficiency virus [HIV] disease: Secondary | ICD-10-CM | POA: Diagnosis not present

## 2017-08-28 DIAGNOSIS — L02611 Cutaneous abscess of right foot: Secondary | ICD-10-CM | POA: Diagnosis not present

## 2017-08-28 DIAGNOSIS — N183 Chronic kidney disease, stage 3 (moderate): Secondary | ICD-10-CM | POA: Diagnosis not present

## 2017-08-28 DIAGNOSIS — E1122 Type 2 diabetes mellitus with diabetic chronic kidney disease: Secondary | ICD-10-CM | POA: Diagnosis not present

## 2017-08-29 ENCOUNTER — Telehealth (INDEPENDENT_AMBULATORY_CARE_PROVIDER_SITE_OTHER): Payer: Self-pay | Admitting: Orthopedic Surgery

## 2017-08-29 NOTE — Telephone Encounter (Signed)
Called and gave verbal ok for OT orders.

## 2017-08-29 NOTE — Telephone Encounter (Signed)
Izora Gala, OT with [facility] requesting extension of occupational therapy  2 x for 3 weeks  1 x 1 for  Week  CB # [phone]

## 2017-09-02 DIAGNOSIS — L02611 Cutaneous abscess of right foot: Secondary | ICD-10-CM | POA: Diagnosis not present

## 2017-09-02 DIAGNOSIS — E1122 Type 2 diabetes mellitus with diabetic chronic kidney disease: Secondary | ICD-10-CM | POA: Diagnosis not present

## 2017-09-02 DIAGNOSIS — N183 Chronic kidney disease, stage 3 (moderate): Secondary | ICD-10-CM | POA: Diagnosis not present

## 2017-09-02 DIAGNOSIS — I129 Hypertensive chronic kidney disease with stage 1 through stage 4 chronic kidney disease, or unspecified chronic kidney disease: Secondary | ICD-10-CM | POA: Diagnosis not present

## 2017-09-02 DIAGNOSIS — Z4781 Encounter for orthopedic aftercare following surgical amputation: Secondary | ICD-10-CM | POA: Diagnosis not present

## 2017-09-02 DIAGNOSIS — B20 Human immunodeficiency virus [HIV] disease: Secondary | ICD-10-CM | POA: Diagnosis not present

## 2017-09-03 DIAGNOSIS — N183 Chronic kidney disease, stage 3 (moderate): Secondary | ICD-10-CM | POA: Diagnosis not present

## 2017-09-03 DIAGNOSIS — E1122 Type 2 diabetes mellitus with diabetic chronic kidney disease: Secondary | ICD-10-CM | POA: Diagnosis not present

## 2017-09-03 DIAGNOSIS — I129 Hypertensive chronic kidney disease with stage 1 through stage 4 chronic kidney disease, or unspecified chronic kidney disease: Secondary | ICD-10-CM | POA: Diagnosis not present

## 2017-09-03 DIAGNOSIS — B20 Human immunodeficiency virus [HIV] disease: Secondary | ICD-10-CM | POA: Diagnosis not present

## 2017-09-03 DIAGNOSIS — L02611 Cutaneous abscess of right foot: Secondary | ICD-10-CM | POA: Diagnosis not present

## 2017-09-03 DIAGNOSIS — Z4781 Encounter for orthopedic aftercare following surgical amputation: Secondary | ICD-10-CM | POA: Diagnosis not present

## 2017-09-04 DIAGNOSIS — E1122 Type 2 diabetes mellitus with diabetic chronic kidney disease: Secondary | ICD-10-CM | POA: Diagnosis not present

## 2017-09-04 DIAGNOSIS — B20 Human immunodeficiency virus [HIV] disease: Secondary | ICD-10-CM | POA: Diagnosis not present

## 2017-09-04 DIAGNOSIS — N183 Chronic kidney disease, stage 3 (moderate): Secondary | ICD-10-CM | POA: Diagnosis not present

## 2017-09-04 DIAGNOSIS — L02611 Cutaneous abscess of right foot: Secondary | ICD-10-CM | POA: Diagnosis not present

## 2017-09-04 DIAGNOSIS — I129 Hypertensive chronic kidney disease with stage 1 through stage 4 chronic kidney disease, or unspecified chronic kidney disease: Secondary | ICD-10-CM | POA: Diagnosis not present

## 2017-09-04 DIAGNOSIS — Z4781 Encounter for orthopedic aftercare following surgical amputation: Secondary | ICD-10-CM | POA: Diagnosis not present

## 2017-09-05 DIAGNOSIS — B20 Human immunodeficiency virus [HIV] disease: Secondary | ICD-10-CM | POA: Diagnosis not present

## 2017-09-05 DIAGNOSIS — G894 Chronic pain syndrome: Secondary | ICD-10-CM | POA: Diagnosis not present

## 2017-09-05 DIAGNOSIS — E1122 Type 2 diabetes mellitus with diabetic chronic kidney disease: Secondary | ICD-10-CM | POA: Diagnosis not present

## 2017-09-05 DIAGNOSIS — M25569 Pain in unspecified knee: Secondary | ICD-10-CM | POA: Diagnosis not present

## 2017-09-05 DIAGNOSIS — M47816 Spondylosis without myelopathy or radiculopathy, lumbar region: Secondary | ICD-10-CM | POA: Diagnosis not present

## 2017-09-05 DIAGNOSIS — N183 Chronic kidney disease, stage 3 (moderate): Secondary | ICD-10-CM | POA: Diagnosis not present

## 2017-09-05 DIAGNOSIS — I129 Hypertensive chronic kidney disease with stage 1 through stage 4 chronic kidney disease, or unspecified chronic kidney disease: Secondary | ICD-10-CM | POA: Diagnosis not present

## 2017-09-05 DIAGNOSIS — T8789 Other complications of amputation stump: Secondary | ICD-10-CM | POA: Diagnosis not present

## 2017-09-05 DIAGNOSIS — L02611 Cutaneous abscess of right foot: Secondary | ICD-10-CM | POA: Diagnosis not present

## 2017-09-05 DIAGNOSIS — Z4781 Encounter for orthopedic aftercare following surgical amputation: Secondary | ICD-10-CM | POA: Diagnosis not present

## 2017-09-08 DIAGNOSIS — I129 Hypertensive chronic kidney disease with stage 1 through stage 4 chronic kidney disease, or unspecified chronic kidney disease: Secondary | ICD-10-CM | POA: Diagnosis not present

## 2017-09-08 DIAGNOSIS — L02611 Cutaneous abscess of right foot: Secondary | ICD-10-CM | POA: Diagnosis not present

## 2017-09-08 DIAGNOSIS — Z4781 Encounter for orthopedic aftercare following surgical amputation: Secondary | ICD-10-CM | POA: Diagnosis not present

## 2017-09-08 DIAGNOSIS — B20 Human immunodeficiency virus [HIV] disease: Secondary | ICD-10-CM | POA: Diagnosis not present

## 2017-09-08 DIAGNOSIS — E1122 Type 2 diabetes mellitus with diabetic chronic kidney disease: Secondary | ICD-10-CM | POA: Diagnosis not present

## 2017-09-08 DIAGNOSIS — N183 Chronic kidney disease, stage 3 (moderate): Secondary | ICD-10-CM | POA: Diagnosis not present

## 2017-09-09 DIAGNOSIS — L02611 Cutaneous abscess of right foot: Secondary | ICD-10-CM | POA: Diagnosis not present

## 2017-09-09 DIAGNOSIS — E1122 Type 2 diabetes mellitus with diabetic chronic kidney disease: Secondary | ICD-10-CM | POA: Diagnosis not present

## 2017-09-09 DIAGNOSIS — N183 Chronic kidney disease, stage 3 (moderate): Secondary | ICD-10-CM | POA: Diagnosis not present

## 2017-09-09 DIAGNOSIS — I129 Hypertensive chronic kidney disease with stage 1 through stage 4 chronic kidney disease, or unspecified chronic kidney disease: Secondary | ICD-10-CM | POA: Diagnosis not present

## 2017-09-09 DIAGNOSIS — B20 Human immunodeficiency virus [HIV] disease: Secondary | ICD-10-CM | POA: Diagnosis not present

## 2017-09-09 DIAGNOSIS — Z4781 Encounter for orthopedic aftercare following surgical amputation: Secondary | ICD-10-CM | POA: Diagnosis not present

## 2017-09-10 ENCOUNTER — Other Ambulatory Visit: Payer: Self-pay | Admitting: Infectious Diseases

## 2017-09-10 ENCOUNTER — Telehealth: Payer: Self-pay

## 2017-09-10 DIAGNOSIS — E119 Type 2 diabetes mellitus without complications: Secondary | ICD-10-CM

## 2017-09-10 DIAGNOSIS — Z794 Long term (current) use of insulin: Secondary | ICD-10-CM

## 2017-09-10 DIAGNOSIS — G8929 Other chronic pain: Secondary | ICD-10-CM

## 2017-09-10 DIAGNOSIS — B20 Human immunodeficiency virus [HIV] disease: Secondary | ICD-10-CM

## 2017-09-10 NOTE — Telephone Encounter (Signed)
Entered pain mgmt referal thanks

## 2017-09-10 NOTE — Telephone Encounter (Signed)
Pt called today requesting an appt to see DR. Hatcher for a hsfu. Pt is scheduled to see Dr. [name] 09/17/17 at 10:45. PT was also hoping MD could refer pt to a pain clinic to manage pain. Will ask Md to send a referral for pt to be seen at pain clinic.  [place]

## 2017-09-11 DIAGNOSIS — N183 Chronic kidney disease, stage 3 (moderate): Secondary | ICD-10-CM | POA: Diagnosis not present

## 2017-09-11 DIAGNOSIS — B20 Human immunodeficiency virus [HIV] disease: Secondary | ICD-10-CM | POA: Diagnosis not present

## 2017-09-11 DIAGNOSIS — Z4781 Encounter for orthopedic aftercare following surgical amputation: Secondary | ICD-10-CM | POA: Diagnosis not present

## 2017-09-11 DIAGNOSIS — I129 Hypertensive chronic kidney disease with stage 1 through stage 4 chronic kidney disease, or unspecified chronic kidney disease: Secondary | ICD-10-CM | POA: Diagnosis not present

## 2017-09-11 DIAGNOSIS — E1122 Type 2 diabetes mellitus with diabetic chronic kidney disease: Secondary | ICD-10-CM | POA: Diagnosis not present

## 2017-09-11 DIAGNOSIS — L02611 Cutaneous abscess of right foot: Secondary | ICD-10-CM | POA: Diagnosis not present

## 2017-09-12 ENCOUNTER — Ambulatory Visit (INDEPENDENT_AMBULATORY_CARE_PROVIDER_SITE_OTHER): Payer: Medicare Other | Admitting: Family Medicine

## 2017-09-12 ENCOUNTER — Encounter: Payer: Self-pay | Admitting: Family Medicine

## 2017-09-12 ENCOUNTER — Other Ambulatory Visit: Payer: Self-pay

## 2017-09-12 VITALS — BP 119/70 | HR 78 | Temp 97.5°F | Ht 69.0 in

## 2017-09-12 DIAGNOSIS — G894 Chronic pain syndrome: Secondary | ICD-10-CM | POA: Diagnosis not present

## 2017-09-12 MED ORDER — TRAMADOL HCL 50 MG PO TABS: 50.0000 mg | ORAL_TABLET | Freq: Three times a day (TID) | ORAL | 0 refills | Status: DC | PRN

## 2017-09-12 NOTE — Progress Notes (Signed)
Subjective:     [name], male   DOB: January 25, 1966, 52 y.o.   MRN: [id number]   PCP: Kathe Becton, NP  Chief Complaint  Patient presents with  . Follow-up    would like referral to pain clinic, right leg pain severe    HPI  Patient has history of Diabetes, Hypertension, and HIV. He is here today for follow up.   Current Status: Patient states that he is feeling well today, but he would like to receive pain medications that he was formerly receiving by Dr. [name]. He states that his pain was currently managed by Oxycodone and Gabapentin. His pain is currently managed by Flexeril, Neurontin, and Ultram. He states that new regimen is not helping his pain. He arrives to [place] today via wheelchair for assistance with ambulation. He is s/p: right below knee amputation in March 2019 because of foot abscess. His surgery was preformed by Dr. Meridee Score.   He is compliant with taking anti-viral medications for HIV. He has a follow up appointment with dr [name] in Infection Disease 09/17/2017. He states that he takes his anti-hypertensive medications occasionally.   He denies fevers, chills, recent infections, fatigue, weigh loss, and night sweats. He denies visual changes, headaches, dizziness, and falls. He denies cough, shortness of breath, chest pain, and heart palpitations.   He states that his appetite is good. He denies abdominal pain, nausea, vomiting, diarrhea, constipation, and any episodes of bleeding.   He states that his pain is stable today.   Review of Systems  Constitutional: Negative.   HENT: Negative.   Eyes: Negative.   Respiratory: Negative.   Cardiovascular: Negative.   Gastrointestinal: Positive for abdominal distention (Obese).  Endocrine: Negative.   Genitourinary: Negative.   Musculoskeletal: Positive for gait problem.       Right below knee amputation 06/2017.   Skin: Negative.   Allergic/Immunologic: Negative.   Hematological: Negative.    Psychiatric/Behavioral: Negative.     Past Medical History:  Diagnosis Date  . AIDS ([place])   . Chronic knee pain    right  . Chronic pain   . Diabetes type 2, uncontrolled ([place])    HgA1c 17.6 (04/27/2010)  . Diabetic foot ulcer ([place]) 01/2017   right foot  . Erectile dysfunction   . Genital warts   . HIV (human immunodeficiency virus infection) ([place]) 2009   CD4 count 100, VL 13800 (05/01/2010)  . Hypertension   . Osteomyelitis ([place])    h/o hand  . Osteomyelitis of metatarsal ([place]) 04/28/2017    Family History  Problem Relation Age of Onset  . Hypertension Mother   . Arthritis Father   . Hypertension Father   . Hypertension Brother   . Cancer Maternal Grandmother 3       unknown type of cancer  . Depression Paternal Grandmother    Social History   Socioeconomic History  . Marital status: Single    Spouse name: Not on file  . Number of children: 3  . Years of education: 66  . Highest education level: Not on file  Occupational History  . Not on file  Social Needs  . Financial resource strain: Not on file  . Food insecurity:    Worry: Not on file    Inability: Not on file  . Transportation needs:    Medical: Not on file    Non-medical: Not on file  Tobacco Use  . Smoking status: Never Smoker  . Smokeless tobacco: Never Used  Substance and Sexual Activity  . Alcohol use: No    Alcohol/week: 0.0 oz  . Drug use: No  . Sexual activity: Yes    Partners: Female    Comment: given condoms  Lifestyle  . Physical activity:    Days per week: Not on file    Minutes per session: Not on file  . Stress: Not on file  Relationships  . Social connections:    Talks on phone: Not on file    Gets together: Not on file    Attends religious service: Not on file    Active member of club or organization: Not on file    Attends meetings of clubs or organizations: Not on file    Relationship status: Not on file  . Intimate partner violence:    Fear of current or ex  partner: Not on file    Emotionally abused: Not on file    Physically abused: Not on file    Forced sexual activity: Not on file  Other Topics Concern  . Not on file  Social History Narrative   Worked for the city of [place] for 18 years.   Unemployed.    Applying for disability.   Medicaid patient.    Past Surgical History:  Procedure Laterality Date  . AMPUTATION Right 05/02/2017   Procedure: AMPUTATION TRANSMETARSAL;  Surgeon: [name], MD;  Location: [facility];  Service: Orthopedics;  Laterality: Right;  . AMPUTATION Right 06/13/2017   Procedure: RIGHT BELOW KNEE AMPUTATION;  Surgeon: [name], MD;  Location: [facility];  Service: Orthopedics;  Laterality: Right;  . BELOW KNEE LEG AMPUTATION Right 06/13/2017  . HERNIA REPAIR    . I&D EXTREMITY Left 08/21/2014   Procedure: INCISION AND DRAINAGE LEFT SMALL FINGER;  Surgeon: Leanora Cover, MD;  Location: [facility];  Service: Orthopedics;  Laterality: Left;  . I&D EXTREMITY Right 03/18/2017   Procedure: IRRIGATION AND DEBRIDEMENT EXTREMITY;  Surgeon: [name], MD;  Location: [facility];  Service: Orthopedics;  Laterality: Right;  . MINOR IRRIGATION AND DEBRIDEMENT OF WOUND Right 04/22/2014   Procedure: IRRIGATION AND DEBRIDEMENT OF RIGHT NECK ABCESS;  Surgeon: Jerrell Belfast, MD;  Location: [facility];  Service: ENT;  Laterality: Right;  . MULTIPLE EXTRACTIONS WITH ALVEOLOPLASTY N/A 01/18/2013   Procedure: MULTIPLE EXTRACION 3, 6, 7, 10, 11, 13, 21, 22, 27, 28, 29, 30 WITH ALVEOLOPLASTY;  Surgeon: Gae Bon, DDS;  Location: [facility];  Service: Oral Surgery;  Laterality: N/A;  . SKIN SPLIT GRAFT Right 03/21/2017   Procedure: IRRIGATION AND DEBRIDEMENT RIGHT FOOT AND APPLY SPLIT THICKNESS SKIN GRAFT AND WOUND VAC;  Surgeon: [name], MD;  Location: [facility];  Service: Orthopedics;  Laterality: Right;  . TEE WITHOUT CARDIOVERSION N/A 05/02/2017   Procedure: TRANSESOPHAGEAL ECHOCARDIOGRAM (TEE);  Surgeon: Acie Fredrickson Wonda Cheng, MD;  Location[facility]  OR;  Service: Cardiovascular;  Laterality: N/A;  coincidental to orthopedic case   Immunization History  Administered Date(s) Administered  . Hepatitis B 05/19/2008  . Hepatitis B, adult 06/02/2013, 07/06/2014  . Influenza Split 04/23/2011  . Influenza Whole 03/24/2008, 01/05/2009, 04/18/2010  . Influenza,inj,Quad PF,6+ Mos 12/18/2012, 02/05/2014, 01/30/2015, 05/13/2016, 03/16/2017  . Meningococcal Mcv4o 05/13/2016  . Pneumococcal Polysaccharide-23 09/08/2006, 11/20/2012, 03/16/2017  . Tdap 05/30/2014    Current Meds  Medication Sig  . abacavir-dolutegravir-lamiVUDine (TRIUMEQ) 600-50-300 MG tablet Take 1 tablet by mouth daily.  Marland Kitchen acetaminophen (TYLENOL) 500 MG tablet Take 1,000 mg by mouth every 6 (six) hours as needed (for pain).  Marland Kitchen  amoxicillin-clavulanate (AUGMENTIN) 875-125 MG tablet Take 1 tablet by mouth 2 (two) times daily. One tab po bid x 10 days  . darunavir-cobicistat (PREZCOBIX) 800-150 MG tablet Take 1 tablet by mouth daily. Swallow whole. Do NOT crush, break or chew tablets. Take with food.  . Insulin Glargine (LANTUS SOLOSTAR) 100 UNIT/ML Solostar Pen Inject 20 Units into the skin daily at 10 pm. (Patient taking differently: Inject 20 Units into the skin at bedtime. )   Allergies  Allergen Reactions  . Sulfa Antibiotics Itching    BP 119/70 (BP Location: Right Arm, Patient Position: Sitting, Cuff Size: Large)   Pulse 78   Temp (!) 97.5 F (36.4 C) (Oral)   Ht 5\' 9"  (1.753 m)   SpO2 100%   BMI 35.88 kg/m    Objective:  Physical Exam  Constitutional: He is oriented to person, place, and time and well-developed, well-nourished, and in no distress.  HENT:  Head: Normocephalic and atraumatic.  Neck: Normal range of motion. Neck supple.  Cardiovascular: Normal rate, regular rhythm and normal heart sounds.  Pulmonary/Chest: Effort normal and breath sounds normal.  Abdominal: Soft. He exhibits distension (Obese).  Musculoskeletal: Normal range of motion.  Right  below knee amputation--06/2017  Neurological: He is alert and oriented to person, place, and time. Gait normal.  Skin: Skin is warm and dry.  Psychiatric: Mood, memory, affect and judgment normal.  Nursing note and vitals reviewed.   Assessment:   Plan:   1. Type 2 diabetes mellitus without complication, without long-term current use of insulin (HCC) Hgb A1c is improved normal at 5.4 pm on 08/27/2017, from 7.4 on 06/17/2017. Last Glucose on 08/19/2017 was normal at 94. - POCT glycosylated hemoglobin (Hb A1C)  2. Hypertension, unspecified type Blood pressure is elevated at 119/71today. He is reminded to take Amlodipine and Lisinopril daily as directed.  3. HIV disease ([place]) He will continue anti-viral medications as directed. Continue to see Dr. [name] in Infectious Disease as needed. Next follow up appointment is 09/17/2017.   4. Ulcer of foot, unspecified laterality, unspecified ulcer stage [facility]) He is s/p: Right below knee amputation 05/2017 by [name]. His pain is stable today. He will continue to follow up with surgeon.   5. Chronic pain syndrome We will refer him to Pain Clinic today.   6. Follow up He will follow up in 3 months.   Meds ordered this encounter  Medications  . traMADol (ULTRAM) 50 MG tablet    Sig: Take 1 tablet (50 mg total) by mouth every 8 (eight) hours as needed.    Dispense:  30 tablet    Refill:  0    Order Specific Question:   Supervising Provider    Answer:   Tresa Garter [[id number]]    Kathe Becton,  MSN, FNP-BC Patient [place] [address] [place],  [address] [phone]

## 2017-09-14 ENCOUNTER — Encounter: Payer: Self-pay | Admitting: Family Medicine

## 2017-09-15 ENCOUNTER — Telehealth (INDEPENDENT_AMBULATORY_CARE_PROVIDER_SITE_OTHER): Payer: Self-pay | Admitting: Orthopedic Surgery

## 2017-09-15 DIAGNOSIS — B20 Human immunodeficiency virus [HIV] disease: Secondary | ICD-10-CM | POA: Diagnosis not present

## 2017-09-15 DIAGNOSIS — E1122 Type 2 diabetes mellitus with diabetic chronic kidney disease: Secondary | ICD-10-CM | POA: Diagnosis not present

## 2017-09-15 DIAGNOSIS — Z4781 Encounter for orthopedic aftercare following surgical amputation: Secondary | ICD-10-CM | POA: Diagnosis not present

## 2017-09-15 DIAGNOSIS — N183 Chronic kidney disease, stage 3 (moderate): Secondary | ICD-10-CM | POA: Diagnosis not present

## 2017-09-15 DIAGNOSIS — I129 Hypertensive chronic kidney disease with stage 1 through stage 4 chronic kidney disease, or unspecified chronic kidney disease: Secondary | ICD-10-CM | POA: Diagnosis not present

## 2017-09-15 DIAGNOSIS — L02611 Cutaneous abscess of right foot: Secondary | ICD-10-CM | POA: Diagnosis not present

## 2017-09-15 NOTE — Telephone Encounter (Signed)
Oxycodone  [place]

## 2017-09-16 DIAGNOSIS — I129 Hypertensive chronic kidney disease with stage 1 through stage 4 chronic kidney disease, or unspecified chronic kidney disease: Secondary | ICD-10-CM | POA: Diagnosis not present

## 2017-09-16 DIAGNOSIS — N183 Chronic kidney disease, stage 3 (moderate): Secondary | ICD-10-CM | POA: Diagnosis not present

## 2017-09-16 DIAGNOSIS — E1122 Type 2 diabetes mellitus with diabetic chronic kidney disease: Secondary | ICD-10-CM | POA: Diagnosis not present

## 2017-09-16 DIAGNOSIS — Z4781 Encounter for orthopedic aftercare following surgical amputation: Secondary | ICD-10-CM | POA: Diagnosis not present

## 2017-09-16 DIAGNOSIS — L02611 Cutaneous abscess of right foot: Secondary | ICD-10-CM | POA: Diagnosis not present

## 2017-09-16 DIAGNOSIS — B20 Human immunodeficiency virus [HIV] disease: Secondary | ICD-10-CM | POA: Diagnosis not present

## 2017-09-17 ENCOUNTER — Ambulatory Visit (INDEPENDENT_AMBULATORY_CARE_PROVIDER_SITE_OTHER): Payer: Medicare Other | Admitting: Infectious Diseases

## 2017-09-17 ENCOUNTER — Encounter: Payer: Self-pay | Admitting: Infectious Diseases

## 2017-09-17 VITALS — BP 144/92 | HR 71 | Temp 98.0°F

## 2017-09-17 DIAGNOSIS — I129 Hypertensive chronic kidney disease with stage 1 through stage 4 chronic kidney disease, or unspecified chronic kidney disease: Secondary | ICD-10-CM | POA: Diagnosis not present

## 2017-09-17 DIAGNOSIS — L02611 Cutaneous abscess of right foot: Secondary | ICD-10-CM | POA: Diagnosis not present

## 2017-09-17 DIAGNOSIS — K009 Disorder of tooth development, unspecified: Secondary | ICD-10-CM

## 2017-09-17 DIAGNOSIS — A630 Anogenital (venereal) warts: Secondary | ICD-10-CM

## 2017-09-17 DIAGNOSIS — N183 Chronic kidney disease, stage 3 (moderate): Secondary | ICD-10-CM | POA: Diagnosis not present

## 2017-09-17 DIAGNOSIS — Z79899 Other long term (current) drug therapy: Secondary | ICD-10-CM | POA: Diagnosis not present

## 2017-09-17 DIAGNOSIS — E1122 Type 2 diabetes mellitus with diabetic chronic kidney disease: Secondary | ICD-10-CM | POA: Diagnosis not present

## 2017-09-17 DIAGNOSIS — Z794 Long term (current) use of insulin: Secondary | ICD-10-CM | POA: Diagnosis not present

## 2017-09-17 DIAGNOSIS — B20 Human immunodeficiency virus [HIV] disease: Secondary | ICD-10-CM | POA: Diagnosis not present

## 2017-09-17 DIAGNOSIS — E119 Type 2 diabetes mellitus without complications: Secondary | ICD-10-CM

## 2017-09-17 DIAGNOSIS — Z113 Encounter for screening for infections with a predominantly sexual mode of transmission: Secondary | ICD-10-CM | POA: Diagnosis not present

## 2017-09-17 DIAGNOSIS — Z4781 Encounter for orthopedic aftercare following surgical amputation: Secondary | ICD-10-CM | POA: Diagnosis not present

## 2017-09-17 NOTE — Assessment & Plan Note (Signed)
Will send him for labs today.  Defers prevnar.  Will defer his pain rx to his PCP, pain clinic eval.  Labs today.  rtc in 4 months.

## 2017-09-17 NOTE — Assessment & Plan Note (Signed)
Will try to get him into dental clinic.

## 2017-09-17 NOTE — Progress Notes (Signed)
   Subjective:    [name], male    DOB: Aug 30, 1965, 52 y.o.   MRN: [id number]  HPI Summary: 52 y.o.M HIV+, uncontrolled DM2, HTN, stage III CKD, and chronic right foot osteomyelitis who presented to the ED 04/28/17 with worsening distal plantar pain and fever/chills concerning for worsening wound infection/osteomyelitis despite outpatient augmentin. He had right trans-met amputation 05/02/2017. Blood cultures grew MRSA, TTE and TEE without signs of vegetation. Repeat blood cultures prior to the surgery were positive, so these were repeated 1/26 (-). PICC line was placed 1/29 and he completed vancomycin to 2/7.   On 06-13-17 he underwent trans-tibial amputation after developing an abscess and wound dehisc in his R foot. He has been followed in pain clinic. He asks for further pain rx today. He was prev followed by dr [name]. Was on ativan, oxycodone.   Nor problems with his ART.  FSG are "good". 110 this AM. A1C is 5.4% (08-27-17).    Attributes his BP being up due to getting PT today.  states his wound is not totally healed yet. He has a "whelp on his privacy" wants to know if he can have burned off.    HIV 1 RNA Quant (copies/mL)  Date Value  01/14/2017 <20  05/13/2016 <20 DETECTED (A)  08/29/2015 <20   CD4 T Cell Abs (/uL)  Date Value  04/28/2017 80 (L)  05/13/2016 210 (L)  08/29/2015 220 (L)    He returns today  Review of Systems  Constitutional: Negative for appetite change and unexpected weight change.  Gastrointestinal: Negative for constipation and diarrhea.  Genitourinary: Negative for difficulty urinating.  Musculoskeletal: Positive for myalgias.  (phantom pain) Please see HPI. All other systems reviewed and negative.      Objective:   Physical Exam  Constitutional: He appears well-developed and well-nourished.  HENT:  Mouth/Throat: No oropharyngeal exudate.  Eyes: Pupils are equal, round, and reactive to light. EOM are normal.  Neck: Normal  range of motion. Neck supple.  Cardiovascular: Normal rate, regular rhythm and normal heart sounds.  Pulmonary/Chest: Effort normal and breath sounds normal.  Abdominal: Soft. Bowel sounds are normal. He exhibits no distension. There is no tenderness.  Musculoskeletal:       Legs: Lymphadenopathy:    He has no cervical adenopathy.           Assessment & Plan:

## 2017-09-17 NOTE — Assessment & Plan Note (Signed)
Will have him seen by uro for removal, quite large.

## 2017-09-17 NOTE — Assessment & Plan Note (Signed)
Will try to get him into ophtho Better controlled.

## 2017-09-18 DIAGNOSIS — N183 Chronic kidney disease, stage 3 (moderate): Secondary | ICD-10-CM | POA: Diagnosis not present

## 2017-09-18 DIAGNOSIS — I129 Hypertensive chronic kidney disease with stage 1 through stage 4 chronic kidney disease, or unspecified chronic kidney disease: Secondary | ICD-10-CM | POA: Diagnosis not present

## 2017-09-18 DIAGNOSIS — Z4781 Encounter for orthopedic aftercare following surgical amputation: Secondary | ICD-10-CM | POA: Diagnosis not present

## 2017-09-18 DIAGNOSIS — E1122 Type 2 diabetes mellitus with diabetic chronic kidney disease: Secondary | ICD-10-CM | POA: Diagnosis not present

## 2017-09-18 DIAGNOSIS — L02611 Cutaneous abscess of right foot: Secondary | ICD-10-CM | POA: Diagnosis not present

## 2017-09-18 DIAGNOSIS — B20 Human immunodeficiency virus [HIV] disease: Secondary | ICD-10-CM | POA: Diagnosis not present

## 2017-09-18 LAB — CBC
HCT: 31.7 % — ABNORMAL LOW (ref 38.5–50.0)
Hemoglobin: 10.6 g/dL — ABNORMAL LOW (ref 13.2–17.1)
MCH: 27.1 pg (ref 27.0–33.0)
MCHC: 33.4 g/dL (ref 32.0–36.0)
MCV: 81.1 fL (ref 80.0–100.0)
MPV: 10.1 fL (ref 7.5–12.5)
Platelets: 239 10*3/uL (ref 140–400)
RBC: 3.91 10*6/uL — ABNORMAL LOW (ref 4.20–5.80)
RDW: 15.4 % — ABNORMAL HIGH (ref 11.0–15.0)
WBC: 4.9 10*3/uL (ref 3.8–10.8)

## 2017-09-18 LAB — COMPREHENSIVE METABOLIC PANEL
AG Ratio: 1.1 (calc) (ref 1.0–2.5)
ALT: 11 U/L (ref 9–46)
AST: 15 U/L (ref 10–35)
Albumin: 4.2 g/dL (ref 3.6–5.1)
Alkaline phosphatase (APISO): 69 U/L (ref 40–115)
BUN/Creatinine Ratio: 13 (calc) (ref 6–22)
BUN: 18 mg/dL (ref 7–25)
CO2: 26 mmol/L (ref 20–32)
Calcium: 9.6 mg/dL (ref 8.6–10.3)
Chloride: 105 mmol/L (ref 98–110)
Creat: 1.37 mg/dL — ABNORMAL HIGH (ref 0.70–1.33)
Globulin: 3.8 g/dL (calc) — ABNORMAL HIGH (ref 1.9–3.7)
Glucose, Bld: 103 mg/dL — ABNORMAL HIGH (ref 65–99)
Potassium: 4.1 mmol/L (ref 3.5–5.3)
Sodium: 139 mmol/L (ref 135–146)
Total Bilirubin: 0.3 mg/dL (ref 0.2–1.2)
Total Protein: 8 g/dL (ref 6.1–8.1)

## 2017-09-18 LAB — RPR: RPR Ser Ql: NONREACTIVE

## 2017-09-19 DIAGNOSIS — N183 Chronic kidney disease, stage 3 (moderate): Secondary | ICD-10-CM | POA: Diagnosis not present

## 2017-09-19 DIAGNOSIS — E1122 Type 2 diabetes mellitus with diabetic chronic kidney disease: Secondary | ICD-10-CM | POA: Diagnosis not present

## 2017-09-19 DIAGNOSIS — L02611 Cutaneous abscess of right foot: Secondary | ICD-10-CM | POA: Diagnosis not present

## 2017-09-19 DIAGNOSIS — I129 Hypertensive chronic kidney disease with stage 1 through stage 4 chronic kidney disease, or unspecified chronic kidney disease: Secondary | ICD-10-CM | POA: Diagnosis not present

## 2017-09-19 DIAGNOSIS — Z4781 Encounter for orthopedic aftercare following surgical amputation: Secondary | ICD-10-CM | POA: Diagnosis not present

## 2017-09-19 DIAGNOSIS — B20 Human immunodeficiency virus [HIV] disease: Secondary | ICD-10-CM | POA: Diagnosis not present

## 2017-09-22 ENCOUNTER — Telehealth (INDEPENDENT_AMBULATORY_CARE_PROVIDER_SITE_OTHER): Payer: Self-pay | Admitting: Orthopedic Surgery

## 2017-09-22 ENCOUNTER — Encounter (INDEPENDENT_AMBULATORY_CARE_PROVIDER_SITE_OTHER): Payer: Self-pay | Admitting: Orthopedic Surgery

## 2017-09-22 ENCOUNTER — Ambulatory Visit (INDEPENDENT_AMBULATORY_CARE_PROVIDER_SITE_OTHER): Payer: Medicare Other | Admitting: Orthopedic Surgery

## 2017-09-22 VITALS — Ht 69.0 in | Wt 243.0 lb

## 2017-09-22 DIAGNOSIS — Z89511 Acquired absence of right leg below knee: Secondary | ICD-10-CM

## 2017-09-22 DIAGNOSIS — N183 Chronic kidney disease, stage 3 (moderate): Secondary | ICD-10-CM | POA: Diagnosis not present

## 2017-09-22 DIAGNOSIS — Z4781 Encounter for orthopedic aftercare following surgical amputation: Secondary | ICD-10-CM | POA: Diagnosis not present

## 2017-09-22 DIAGNOSIS — I129 Hypertensive chronic kidney disease with stage 1 through stage 4 chronic kidney disease, or unspecified chronic kidney disease: Secondary | ICD-10-CM | POA: Diagnosis not present

## 2017-09-22 DIAGNOSIS — G894 Chronic pain syndrome: Secondary | ICD-10-CM | POA: Diagnosis not present

## 2017-09-22 DIAGNOSIS — Z79899 Other long term (current) drug therapy: Secondary | ICD-10-CM | POA: Diagnosis not present

## 2017-09-22 DIAGNOSIS — F411 Generalized anxiety disorder: Secondary | ICD-10-CM | POA: Diagnosis not present

## 2017-09-22 DIAGNOSIS — E1122 Type 2 diabetes mellitus with diabetic chronic kidney disease: Secondary | ICD-10-CM | POA: Diagnosis not present

## 2017-09-22 DIAGNOSIS — L02611 Cutaneous abscess of right foot: Secondary | ICD-10-CM | POA: Diagnosis not present

## 2017-09-22 DIAGNOSIS — G546 Phantom limb syndrome with pain: Secondary | ICD-10-CM | POA: Diagnosis not present

## 2017-09-22 DIAGNOSIS — B20 Human immunodeficiency virus [HIV] disease: Secondary | ICD-10-CM | POA: Diagnosis not present

## 2017-09-22 NOTE — Telephone Encounter (Signed)
Levada Dy with [facility] is requesting continued skilled nursing visits to treat area to patients right BKA until it is healed. 2 x a week.  CB # [phone]

## 2017-09-22 NOTE — Progress Notes (Signed)
Office Visit Note   Patient: Keith [name]           Date of Birth: 09-Sep-1965           MRN: [id number] Visit Date: 09/22/2017              Requested by: [address], [place], [place] [address] PCP: [name], FNP  Chief Complaint  Patient presents with  . Right Leg - Routine Post Op    06/13/17 right BKA      HPI: Patient is a 52 year old gentleman who presents he is over 3 months out from a right transtibial amputation.  Patient is currently wearing a stump shrinker he has a pulled over his pants.  Patient does have a follow-up appointment for prosthetic fitting.  Patient's PMP aware overdose risk score is 700.  Assessment & Plan: Visit Diagnoses:  1. Acquired absence of right leg below knee (HCC)     Plan: Recommended following up with the orthotist for prosthetic fitting continue with the stump shrinker.  Patient states he is using [place] honey and a Band-Aid to the wound.  Feel patient can be fit for his prosthesis at this time.  Discussed the critical level of his narcotic use and recommended trying Neurontin or gabapentin for pain relief.  Patient states this does not work.  We will not prescribe narcotics through this office due to his high overdose risk score.  Follow-Up Instructions: Return in about 1 month (around 10/20/2017).   Ortho Exam  Patient is alert, oriented, no adenopathy, well-dressed, normal affect, normal respiratory effort. Examination patient's incision is healed nicely there is good consolidation of his limb he has full extension of his knee.  He has one small wound that is 2 mm x 10 mm and 1 mm deep there is good granulation tissue at the base no signs of infection no odor no drainage.  A Band-Aid and Bactroban was applied.  Imaging: No results found. No images are attached to the encounter.  Labs: Lab Results  Component Value Date   HGBA1C 5.4 08/27/2017   HGBA1C 7.4 (H) 06/13/2017   HGBA1C 10.1 (H) 03/15/2017   ESRSEDRATE 140 (H) 04/28/2017   ESRSEDRATE 127 (H) 03/15/2017   ESRSEDRATE 94 (H) 01/07/2017   CRP 14.9 (H) 04/28/2017   CRP 23.1 (H) 03/15/2017   CRP 1.8 (H) 01/08/2017   LABURIC 6.3 08/07/2009   REPTSTATUS 05/08/2017 FINAL 05/03/2017   GRAMSTAIN  04/29/2017    NO WBC SEEN RARE GRAM POSITIVE COCCI IN PAIRS IN SINGLES    CULT NO GROWTH 5 DAYS 05/03/2017   LABORGA METHICILLIN RESISTANT STAPHYLOCOCCUS AUREUS 04/29/2017     Lab Results  Component Value Date   ALBUMIN 3.6 08/19/2017   ALBUMIN 3.0 (L) 04/28/2017   ALBUMIN 3.0 (L) 01/15/2017   PREALBUMIN 7.4 (L) 03/15/2017   LABURIC 6.3 08/07/2009    Body mass index is 35.88 kg/m.  Orders:  No orders of the defined types were placed in this encounter.  No orders of the defined types were placed in this encounter.    Procedures: No procedures performed  Clinical Data: No additional findings.  ROS:  All other systems negative, except as noted in the HPI. Review of Systems  Objective: Vital Signs: Ht 5\' 9"  (1.753 m)   Wt 243 lb (110.2 kg)   BMI 35.88 kg/m   Specialty Comments:  No specialty comments available.  PMFS History: Patient Active Problem List   Diagnosis  Date Noted  . Below knee amputation status, right ([place]) 06/13/2017  . S/P transmetatarsal amputation of foot, right ([place]) 05/07/2017  . Wound infection   . Bacteremia   . Fever   . Sepsis ([place]) 03/15/2017  . CKD (chronic kidney disease) stage 3, GFR 30-59 ml/min (HCC) 03/15/2017  . Diabetic polyneuropathy associated with type 2 diabetes mellitus ([place]) 01/23/2017  . Right foot ulcer, limited to breakdown of skin ([place]) 01/07/2017  . AKI (acute kidney injury) ([place]) 09/28/2016  . Nausea vomiting and diarrhea 09/28/2016  . Onychomycosis of multiple toenails with type 2 diabetes mellitus ([place]) 08/29/2015  . MRSA carrier 04/20/2014  . Testosterone deficiency 04/20/2014  . Penile wart 02/22/2014  . HIV disease ([place])   . Insulin-requiring or dependent  type II diabetes mellitus ([place]) 02/04/2014  . Dental anomaly 11/20/2012  . Arthritis of right knee 02/23/2012  . Hyperlipidemia 11/10/2011  . Chronic pain 08/07/2011  . Meralgia paraesthetica 04/23/2011  . ERECTILE DYSFUNCTION 08/22/2008  . Essential hypertension 05/19/2008   Past Medical History:  Diagnosis Date  . Abscess of right foot   . AIDS ([place])   . Chronic knee pain    right  . Chronic pain   . Diabetes type 2, uncontrolled ([place])    HgA1c 17.6 (04/27/2010)  . Diabetic foot ulcer ([place]) 01/2017   right foot  . Erectile dysfunction   . Genital warts   . HIV (human immunodeficiency virus infection) ([place]) 2009   CD4 count 100, VL 13800 (05/01/2010)  . Hypertension   . Osteomyelitis ([place])    h/o hand  . Osteomyelitis of metatarsal ([place]) 04/28/2017    Family History  Problem Relation Age of Onset  . Hypertension Mother   . Arthritis Father   . Hypertension Father   . Hypertension Brother   . Cancer Maternal Grandmother 63       unknown type of cancer  . Depression Paternal Grandmother     Past Surgical History:  Procedure Laterality Date  . AMPUTATION Right 05/02/2017   Procedure: AMPUTATION TRANSMETARSAL;  Surgeon: [name], MD;  Location: [facility];  Service: Orthopedics;  Laterality: Right;  . AMPUTATION Right 06/13/2017   Procedure: RIGHT BELOW KNEE AMPUTATION;  Surgeon: [name], MD;  Location: [facility];  Service: Orthopedics;  Laterality: Right;  . BELOW KNEE LEG AMPUTATION Right 06/13/2017  . HERNIA REPAIR    . I&D EXTREMITY Left 08/21/2014   Procedure: INCISION AND DRAINAGE LEFT SMALL FINGER;  Surgeon: Leanora Cover, MD;  Location: [facility];  Service: Orthopedics;  Laterality: Left;  . I&D EXTREMITY Right 03/18/2017   Procedure: IRRIGATION AND DEBRIDEMENT EXTREMITY;  Surgeon: [name], MD;  Location: [facility];  Service: Orthopedics;  Laterality: Right;  . MINOR IRRIGATION AND DEBRIDEMENT OF WOUND Right 04/22/2014   Procedure: IRRIGATION AND DEBRIDEMENT OF  RIGHT NECK ABCESS;  Surgeon: Jerrell Belfast, MD;  Location: [facility];  Service: ENT;  Laterality: Right;  . MULTIPLE EXTRACTIONS WITH ALVEOLOPLASTY N/A 01/18/2013   Procedure: MULTIPLE EXTRACION 3, 6, 7, 10, 11, 13, 21, 22, 27, 28, 29, 30 WITH ALVEOLOPLASTY;  Surgeon: Gae Bon, DDS;  Location: [facility];  Service: Oral Surgery;  Laterality: N/A;  . SKIN SPLIT GRAFT Right 03/21/2017   Procedure: IRRIGATION AND DEBRIDEMENT RIGHT FOOT AND APPLY SPLIT THICKNESS SKIN GRAFT AND WOUND VAC;  Surgeon: [name], MD;  Location: [facility];  Service: Orthopedics;  Laterality: Right;  . TEE WITHOUT CARDIOVERSION N/A 05/02/2017   Procedure: TRANSESOPHAGEAL ECHOCARDIOGRAM (  TEE);  Surgeon: Acie Fredrickson Wonda Cheng, MD;  Location[facility];  Service: Cardiovascular;  Laterality: N/A;  coincidental to orthopedic case   Social History   Occupational History  . Not on file  Tobacco Use  . Smoking status: Never Smoker  . Smokeless tobacco: Never Used  Substance and Sexual Activity  . Alcohol use: No    Alcohol/week: 0.0 oz  . Drug use: No  . Sexual activity: Yes    Partners: Female    Comment: given condoms

## 2017-09-23 DIAGNOSIS — N183 Chronic kidney disease, stage 3 (moderate): Secondary | ICD-10-CM | POA: Diagnosis not present

## 2017-09-23 DIAGNOSIS — L02611 Cutaneous abscess of right foot: Secondary | ICD-10-CM | POA: Diagnosis not present

## 2017-09-23 DIAGNOSIS — B20 Human immunodeficiency virus [HIV] disease: Secondary | ICD-10-CM | POA: Diagnosis not present

## 2017-09-23 DIAGNOSIS — I129 Hypertensive chronic kidney disease with stage 1 through stage 4 chronic kidney disease, or unspecified chronic kidney disease: Secondary | ICD-10-CM | POA: Diagnosis not present

## 2017-09-23 DIAGNOSIS — E1122 Type 2 diabetes mellitus with diabetic chronic kidney disease: Secondary | ICD-10-CM | POA: Diagnosis not present

## 2017-09-23 DIAGNOSIS — Z4781 Encounter for orthopedic aftercare following surgical amputation: Secondary | ICD-10-CM | POA: Diagnosis not present

## 2017-09-23 LAB — HIV-1 RNA ULTRAQUANT REFLEX TO GENTYP+
HIV 1 RNA Quant: <20 copies/mL
HIV-1 RNA Quant, Log: <1.3 Log copies/mL

## 2017-09-23 NOTE — Telephone Encounter (Signed)
Called and gave verbal ok for the next two weeks.

## 2017-09-24 DIAGNOSIS — L02611 Cutaneous abscess of right foot: Secondary | ICD-10-CM | POA: Diagnosis not present

## 2017-09-24 DIAGNOSIS — Z4781 Encounter for orthopedic aftercare following surgical amputation: Secondary | ICD-10-CM | POA: Diagnosis not present

## 2017-09-24 DIAGNOSIS — E1122 Type 2 diabetes mellitus with diabetic chronic kidney disease: Secondary | ICD-10-CM | POA: Diagnosis not present

## 2017-09-24 DIAGNOSIS — N183 Chronic kidney disease, stage 3 (moderate): Secondary | ICD-10-CM | POA: Diagnosis not present

## 2017-09-24 DIAGNOSIS — I129 Hypertensive chronic kidney disease with stage 1 through stage 4 chronic kidney disease, or unspecified chronic kidney disease: Secondary | ICD-10-CM | POA: Diagnosis not present

## 2017-09-24 DIAGNOSIS — B20 Human immunodeficiency virus [HIV] disease: Secondary | ICD-10-CM | POA: Diagnosis not present

## 2017-09-25 DIAGNOSIS — Z4781 Encounter for orthopedic aftercare following surgical amputation: Secondary | ICD-10-CM | POA: Diagnosis not present

## 2017-09-25 DIAGNOSIS — E1122 Type 2 diabetes mellitus with diabetic chronic kidney disease: Secondary | ICD-10-CM | POA: Diagnosis not present

## 2017-09-25 DIAGNOSIS — L02611 Cutaneous abscess of right foot: Secondary | ICD-10-CM | POA: Diagnosis not present

## 2017-09-25 DIAGNOSIS — I129 Hypertensive chronic kidney disease with stage 1 through stage 4 chronic kidney disease, or unspecified chronic kidney disease: Secondary | ICD-10-CM | POA: Diagnosis not present

## 2017-09-25 DIAGNOSIS — N183 Chronic kidney disease, stage 3 (moderate): Secondary | ICD-10-CM | POA: Diagnosis not present

## 2017-09-25 DIAGNOSIS — B20 Human immunodeficiency virus [HIV] disease: Secondary | ICD-10-CM | POA: Diagnosis not present

## 2017-09-26 DIAGNOSIS — B20 Human immunodeficiency virus [HIV] disease: Secondary | ICD-10-CM | POA: Diagnosis not present

## 2017-09-26 DIAGNOSIS — Z4781 Encounter for orthopedic aftercare following surgical amputation: Secondary | ICD-10-CM | POA: Diagnosis not present

## 2017-09-26 DIAGNOSIS — E1122 Type 2 diabetes mellitus with diabetic chronic kidney disease: Secondary | ICD-10-CM | POA: Diagnosis not present

## 2017-09-26 DIAGNOSIS — L02611 Cutaneous abscess of right foot: Secondary | ICD-10-CM | POA: Diagnosis not present

## 2017-09-26 DIAGNOSIS — I129 Hypertensive chronic kidney disease with stage 1 through stage 4 chronic kidney disease, or unspecified chronic kidney disease: Secondary | ICD-10-CM | POA: Diagnosis not present

## 2017-09-26 DIAGNOSIS — N183 Chronic kidney disease, stage 3 (moderate): Secondary | ICD-10-CM | POA: Diagnosis not present

## 2017-09-29 DIAGNOSIS — B20 Human immunodeficiency virus [HIV] disease: Secondary | ICD-10-CM | POA: Diagnosis not present

## 2017-09-29 DIAGNOSIS — N183 Chronic kidney disease, stage 3 (moderate): Secondary | ICD-10-CM | POA: Diagnosis not present

## 2017-09-29 DIAGNOSIS — I129 Hypertensive chronic kidney disease with stage 1 through stage 4 chronic kidney disease, or unspecified chronic kidney disease: Secondary | ICD-10-CM | POA: Diagnosis not present

## 2017-09-29 DIAGNOSIS — Z4781 Encounter for orthopedic aftercare following surgical amputation: Secondary | ICD-10-CM | POA: Diagnosis not present

## 2017-09-29 DIAGNOSIS — L02611 Cutaneous abscess of right foot: Secondary | ICD-10-CM | POA: Diagnosis not present

## 2017-09-29 DIAGNOSIS — E1122 Type 2 diabetes mellitus with diabetic chronic kidney disease: Secondary | ICD-10-CM | POA: Diagnosis not present

## 2017-10-02 ENCOUNTER — Telehealth: Payer: Self-pay

## 2017-10-02 DIAGNOSIS — B20 Human immunodeficiency virus [HIV] disease: Secondary | ICD-10-CM | POA: Diagnosis not present

## 2017-10-02 DIAGNOSIS — E1122 Type 2 diabetes mellitus with diabetic chronic kidney disease: Secondary | ICD-10-CM | POA: Diagnosis not present

## 2017-10-02 DIAGNOSIS — N183 Chronic kidney disease, stage 3 (moderate): Secondary | ICD-10-CM | POA: Diagnosis not present

## 2017-10-02 DIAGNOSIS — Z4781 Encounter for orthopedic aftercare following surgical amputation: Secondary | ICD-10-CM | POA: Diagnosis not present

## 2017-10-02 DIAGNOSIS — I129 Hypertensive chronic kidney disease with stage 1 through stage 4 chronic kidney disease, or unspecified chronic kidney disease: Secondary | ICD-10-CM | POA: Diagnosis not present

## 2017-10-02 DIAGNOSIS — L02611 Cutaneous abscess of right foot: Secondary | ICD-10-CM | POA: Diagnosis not present

## 2017-10-02 NOTE — Telephone Encounter (Signed)
PT called today wanting to know when Dental Clinic would be back was unable to give him a exact day just informed pt that as of now Dental is set to be back in July. Pt denied the number for dental clinic and stated he would just wait on a call from them. Pt also requested to know what his labs were and if he was still undetectable. Pt had no further questions for me during the call. [place]

## 2017-10-06 ENCOUNTER — Other Ambulatory Visit: Payer: Self-pay | Admitting: Family Medicine

## 2017-10-06 DIAGNOSIS — N183 Chronic kidney disease, stage 3 (moderate): Secondary | ICD-10-CM | POA: Diagnosis not present

## 2017-10-06 DIAGNOSIS — Z4781 Encounter for orthopedic aftercare following surgical amputation: Secondary | ICD-10-CM | POA: Diagnosis not present

## 2017-10-06 DIAGNOSIS — G546 Phantom limb syndrome with pain: Secondary | ICD-10-CM | POA: Diagnosis not present

## 2017-10-06 DIAGNOSIS — I129 Hypertensive chronic kidney disease with stage 1 through stage 4 chronic kidney disease, or unspecified chronic kidney disease: Secondary | ICD-10-CM | POA: Diagnosis not present

## 2017-10-06 DIAGNOSIS — L02611 Cutaneous abscess of right foot: Secondary | ICD-10-CM | POA: Diagnosis not present

## 2017-10-06 DIAGNOSIS — B20 Human immunodeficiency virus [HIV] disease: Secondary | ICD-10-CM | POA: Diagnosis not present

## 2017-10-06 DIAGNOSIS — G894 Chronic pain syndrome: Secondary | ICD-10-CM | POA: Diagnosis not present

## 2017-10-06 DIAGNOSIS — E1122 Type 2 diabetes mellitus with diabetic chronic kidney disease: Secondary | ICD-10-CM | POA: Diagnosis not present

## 2017-10-06 DIAGNOSIS — Z79899 Other long term (current) drug therapy: Secondary | ICD-10-CM | POA: Diagnosis not present

## 2017-10-08 DIAGNOSIS — I129 Hypertensive chronic kidney disease with stage 1 through stage 4 chronic kidney disease, or unspecified chronic kidney disease: Secondary | ICD-10-CM | POA: Diagnosis not present

## 2017-10-08 DIAGNOSIS — L02611 Cutaneous abscess of right foot: Secondary | ICD-10-CM | POA: Diagnosis not present

## 2017-10-08 DIAGNOSIS — E1122 Type 2 diabetes mellitus with diabetic chronic kidney disease: Secondary | ICD-10-CM | POA: Diagnosis not present

## 2017-10-08 DIAGNOSIS — Z4781 Encounter for orthopedic aftercare following surgical amputation: Secondary | ICD-10-CM | POA: Diagnosis not present

## 2017-10-08 DIAGNOSIS — N183 Chronic kidney disease, stage 3 (moderate): Secondary | ICD-10-CM | POA: Diagnosis not present

## 2017-10-08 DIAGNOSIS — B20 Human immunodeficiency virus [HIV] disease: Secondary | ICD-10-CM | POA: Diagnosis not present

## 2017-10-13 DIAGNOSIS — I129 Hypertensive chronic kidney disease with stage 1 through stage 4 chronic kidney disease, or unspecified chronic kidney disease: Secondary | ICD-10-CM | POA: Diagnosis not present

## 2017-10-13 DIAGNOSIS — E1122 Type 2 diabetes mellitus with diabetic chronic kidney disease: Secondary | ICD-10-CM | POA: Diagnosis not present

## 2017-10-13 DIAGNOSIS — B20 Human immunodeficiency virus [HIV] disease: Secondary | ICD-10-CM | POA: Diagnosis not present

## 2017-10-13 DIAGNOSIS — Z4781 Encounter for orthopedic aftercare following surgical amputation: Secondary | ICD-10-CM | POA: Diagnosis not present

## 2017-10-13 DIAGNOSIS — N183 Chronic kidney disease, stage 3 (moderate): Secondary | ICD-10-CM | POA: Diagnosis not present

## 2017-10-13 DIAGNOSIS — L02611 Cutaneous abscess of right foot: Secondary | ICD-10-CM | POA: Diagnosis not present

## 2017-10-16 DIAGNOSIS — N183 Chronic kidney disease, stage 3 (moderate): Secondary | ICD-10-CM | POA: Diagnosis not present

## 2017-10-16 DIAGNOSIS — E1122 Type 2 diabetes mellitus with diabetic chronic kidney disease: Secondary | ICD-10-CM | POA: Diagnosis not present

## 2017-10-16 DIAGNOSIS — F411 Generalized anxiety disorder: Secondary | ICD-10-CM | POA: Diagnosis not present

## 2017-10-16 DIAGNOSIS — B20 Human immunodeficiency virus [HIV] disease: Secondary | ICD-10-CM | POA: Diagnosis not present

## 2017-10-16 DIAGNOSIS — Z4781 Encounter for orthopedic aftercare following surgical amputation: Secondary | ICD-10-CM | POA: Diagnosis not present

## 2017-10-16 DIAGNOSIS — L02611 Cutaneous abscess of right foot: Secondary | ICD-10-CM | POA: Diagnosis not present

## 2017-10-16 DIAGNOSIS — I129 Hypertensive chronic kidney disease with stage 1 through stage 4 chronic kidney disease, or unspecified chronic kidney disease: Secondary | ICD-10-CM | POA: Diagnosis not present

## 2017-10-16 DIAGNOSIS — F329 Major depressive disorder, single episode, unspecified: Secondary | ICD-10-CM | POA: Diagnosis not present

## 2017-10-28 ENCOUNTER — Telehealth (HOSPITAL_COMMUNITY): Payer: Self-pay | Admitting: General Practice

## 2017-10-28 NOTE — Telephone Encounter (Signed)
Patient called, wanting to speak with his provider. I told the patient to call back tomorrow morning to schedule an appointment because he started to ask me about his personal medical issues over the phone. Patient verbalized understanding. I informed [place] NP about the call. She is agreeable to him calling back to schedule an appointment tomorrow.

## 2017-11-03 ENCOUNTER — Ambulatory Visit (INDEPENDENT_AMBULATORY_CARE_PROVIDER_SITE_OTHER): Payer: Medicare Other | Admitting: Orthopedic Surgery

## 2017-11-03 ENCOUNTER — Encounter (INDEPENDENT_AMBULATORY_CARE_PROVIDER_SITE_OTHER): Payer: Self-pay | Admitting: Orthopedic Surgery

## 2017-11-03 VITALS — Ht 69.0 in | Wt 243.0 lb

## 2017-11-03 DIAGNOSIS — Z89511 Acquired absence of right leg below knee: Secondary | ICD-10-CM

## 2017-11-03 NOTE — Progress Notes (Signed)
Office Visit Note   Patient: Keith [name]           Date of Birth: 1966/03/30           MRN: [id number] Visit Date: 11/03/2017              Requested by: [name], [address], [place] [address] PCP: [name], FNP  Chief Complaint  Patient presents with  . Right Leg - Follow-up    06/13/17 right BKA      HPI: Patient is a 52 year old gentleman who is 5 months status post right transtibial amputation.  Patient is currently wearing compression stockings for the venous insufficiency left lower extremity.  Patient states he will be fit with his prosthesis next week at [facility].  Assessment & Plan: Visit Diagnoses:  1. Acquired absence of right leg below knee [facility])     Plan: Follow-up with [facility] for prosthetic fitting follow-up with [facility] for gait training.  Continue with the compression stockings on the left.  Follow-Up Instructions: Return in about 2 months (around 01/04/2018).   Ortho Exam  Patient is alert, oriented, no adenopathy, well-dressed, normal affect, normal respiratory effort. Examination patient has excellent consolidation of the right transtibial amputation there is no open wounds no ulcers no signs of infection left lower extremity patient has no active venous stasis ulcers.  Imaging: No results found. No images are attached to the encounter.  Labs: Lab Results  Component Value Date   HGBA1C 5.4 08/27/2017   HGBA1C 7.4 (H) 06/13/2017   HGBA1C 10.1 (H) 03/15/2017   ESRSEDRATE 140 (H) 04/28/2017   ESRSEDRATE 127 (H) 03/15/2017   ESRSEDRATE 94 (H) 01/07/2017   CRP 14.9 (H) 04/28/2017   CRP 23.1 (H) 03/15/2017   CRP 1.8 (H) 01/08/2017   LABURIC 6.3 08/07/2009   REPTSTATUS 05/08/2017 FINAL 05/03/2017   GRAMSTAIN  04/29/2017    NO WBC SEEN RARE GRAM POSITIVE COCCI IN PAIRS IN SINGLES    CULT NO GROWTH 5 DAYS 05/03/2017   LABORGA METHICILLIN RESISTANT STAPHYLOCOCCUS AUREUS 04/29/2017     Lab Results  Component  Value Date   ALBUMIN 3.6 08/19/2017   ALBUMIN 3.0 (L) 04/28/2017   ALBUMIN 3.0 (L) 01/15/2017   PREALBUMIN 7.4 (L) 03/15/2017   LABURIC 6.3 08/07/2009    Body mass index is 35.88 kg/m.  Orders:  No orders of the defined types were placed in this encounter.  No orders of the defined types were placed in this encounter.    Procedures: No procedures performed  Clinical Data: No additional findings.  ROS:  All other systems negative, except as noted in the HPI. Review of Systems  Objective: Vital Signs: Ht 5\' 9"  (1.753 m)   Wt 243 lb (110.2 kg)   BMI 35.88 kg/m   Specialty Comments:  No specialty comments available.  PMFS History: Patient Active Problem List   Diagnosis Date Noted  . Below knee amputation status, right ([place]) 06/13/2017  . S/P transmetatarsal amputation of foot, right ([place]) 05/07/2017  . Wound infection   . Bacteremia   . Fever   . Sepsis ([place]) 03/15/2017  . CKD (chronic kidney disease) stage 3, GFR 30-59 ml/min (HCC) 03/15/2017  . Diabetic polyneuropathy associated with type 2 diabetes mellitus ([place]) 01/23/2017  . Right foot ulcer, limited to breakdown of skin ([place]) 01/07/2017  . AKI (acute kidney injury) ([place]) 09/28/2016  . Nausea vomiting and diarrhea 09/28/2016  . Onychomycosis of multiple toenails with type 2  diabetes mellitus ([place]) 08/29/2015  . MRSA carrier 04/20/2014  . Testosterone deficiency 04/20/2014  . Penile wart 02/22/2014  . HIV disease ([place])   . Insulin-requiring or dependent type II diabetes mellitus ([place]) 02/04/2014  . Dental anomaly 11/20/2012  . Arthritis of right knee 02/23/2012  . Hyperlipidemia 11/10/2011  . Chronic pain 08/07/2011  . Meralgia paraesthetica 04/23/2011  . ERECTILE DYSFUNCTION 08/22/2008  . Essential hypertension 05/19/2008   Past Medical History:  Diagnosis Date  . Abscess of right foot   . AIDS ([place])   . Chronic knee pain    right  . Chronic pain   . Diabetes type 2, uncontrolled ([place])     HgA1c 17.6 (04/27/2010)  . Diabetic foot ulcer ([place]) 01/2017   right foot  . Erectile dysfunction   . Genital warts   . HIV (human immunodeficiency virus infection) ([place]) 2009   CD4 count 100, VL 13800 (05/01/2010)  . Hypertension   . Osteomyelitis ([place])    h/o hand  . Osteomyelitis of metatarsal ([place]) 04/28/2017    Family History  Problem Relation Age of Onset  . Hypertension Mother   . Arthritis Father   . Hypertension Father   . Hypertension Brother   . Cancer Maternal Grandmother 69       unknown type of cancer  . Depression Paternal Grandmother     Past Surgical History:  Procedure Laterality Date  . AMPUTATION Right 05/02/2017   Procedure: AMPUTATION TRANSMETARSAL;  Surgeon: [name], MD;  Location: [facility];  Service: Orthopedics;  Laterality: Right;  . AMPUTATION Right 06/13/2017   Procedure: RIGHT BELOW KNEE AMPUTATION;  Surgeon: [name], MD;  Location: [place];  Service: Orthopedics;  Laterality: Right;  . BELOW KNEE LEG AMPUTATION Right 06/13/2017  . HERNIA REPAIR    . I&D EXTREMITY Left 08/21/2014   Procedure: INCISION AND DRAINAGE LEFT SMALL FINGER;  Surgeon: Leanora Cover, MD;  Location: [facility];  Service: Orthopedics;  Laterality: Left;  . I&D EXTREMITY Right 03/18/2017   Procedure: IRRIGATION AND DEBRIDEMENT EXTREMITY;  Surgeon: [name], MD;  Location: [facility];  Service: Orthopedics;  Laterality: Right;  . MINOR IRRIGATION AND DEBRIDEMENT OF WOUND Right 04/22/2014   Procedure: IRRIGATION AND DEBRIDEMENT OF RIGHT NECK ABCESS;  Surgeon: Jerrell Belfast, MD;  Location: [facility];  Service: ENT;  Laterality: Right;  . MULTIPLE EXTRACTIONS WITH ALVEOLOPLASTY N/A 01/18/2013   Procedure: MULTIPLE EXTRACION 3, 6, 7, 10, 11, 13, 21, 22, 27, 28, 29, 30 WITH ALVEOLOPLASTY;  Surgeon: Gae Bon, DDS;  Location: [facility];  Service: Oral Surgery;  Laterality: N/A;  . SKIN SPLIT GRAFT Right 03/21/2017   Procedure: IRRIGATION AND DEBRIDEMENT RIGHT FOOT AND APPLY SPLIT THICKNESS  SKIN GRAFT AND WOUND VAC;  Surgeon: [name], MD;  Location: [facility];  Service: Orthopedics;  Laterality: Right;  . TEE WITHOUT CARDIOVERSION N/A 05/02/2017   Procedure: TRANSESOPHAGEAL ECHOCARDIOGRAM (TEE);  Surgeon: Acie Fredrickson Wonda Cheng, MD;  Location: [facility];  Service: Cardiovascular;  Laterality: N/A;  coincidental to orthopedic case   Social History   Occupational History  . Not on file  Tobacco Use  . Smoking status: Never Smoker  . Smokeless tobacco: Never Used  Substance and Sexual Activity  . Alcohol use: No    Alcohol/week: 0.0 oz  . Drug use: No  . Sexual activity: Yes    Partners: Female    Comment: given condoms

## 2017-11-04 ENCOUNTER — Encounter: Payer: Self-pay | Admitting: Family Medicine

## 2017-11-04 ENCOUNTER — Ambulatory Visit (INDEPENDENT_AMBULATORY_CARE_PROVIDER_SITE_OTHER): Payer: Medicare Other | Admitting: Family Medicine

## 2017-11-04 VITALS — BP 140/96 | HR 80 | Temp 97.8°F | Ht 69.0 in | Wt 272.0 lb

## 2017-11-04 DIAGNOSIS — N529 Male erectile dysfunction, unspecified: Secondary | ICD-10-CM

## 2017-11-04 DIAGNOSIS — R8290 Unspecified abnormal findings in urine: Secondary | ICD-10-CM | POA: Diagnosis not present

## 2017-11-04 DIAGNOSIS — Z09 Encounter for follow-up examination after completed treatment for conditions other than malignant neoplasm: Secondary | ICD-10-CM

## 2017-11-04 DIAGNOSIS — G894 Chronic pain syndrome: Secondary | ICD-10-CM | POA: Diagnosis not present

## 2017-11-04 DIAGNOSIS — I10 Essential (primary) hypertension: Secondary | ICD-10-CM

## 2017-11-04 DIAGNOSIS — E119 Type 2 diabetes mellitus without complications: Secondary | ICD-10-CM | POA: Diagnosis not present

## 2017-11-04 DIAGNOSIS — B20 Human immunodeficiency virus [HIV] disease: Secondary | ICD-10-CM | POA: Diagnosis not present

## 2017-11-04 LAB — POCT GLYCOSYLATED HEMOGLOBIN (HGB A1C): Hemoglobin A1C: 6.4 % — AB (ref 4.0–5.6)

## 2017-11-04 LAB — POCT URINALYSIS DIP (MANUAL ENTRY)
Bilirubin, UA: NEGATIVE
Glucose, UA: NEGATIVE mg/dL
Ketones, POC UA: NEGATIVE mg/dL
Leukocytes, UA: NEGATIVE
Nitrite, UA: NEGATIVE
Protein Ur, POC: 100 mg/dL — AB
Spec Grav, UA: 1.025 (ref 1.010–1.025)
Urobilinogen, UA: 1 E.U./dL
pH, UA: 5 (ref 5.0–8.0)

## 2017-11-04 NOTE — Patient Instructions (Signed)
Tadalafil tablets (Adcirca) What is this medicine? TADALAFIL (tah DA la fil) is used to treat pulmonary arterial hypertension. This is a serious heart and lung condition. This medicine helps to improve symptoms and quality of life. This medicine may be used for other purposes; ask your health care provider or pharmacist if you have questions. COMMON BRAND NAME(S): Adcirca What should I tell my health care provider before I take this medicine? They need to know if you have any of these conditions: -eye or vision problems, including a rare inherited eye disease called retinitis pigmentosa -heart disease, angina, a history of heart attack, irregular heart beats, or other heart problems -high or low blood pressure -kidney disease -liver disease -pulmonary veno-occlusive disease (PVOD) -stomach ulcers -stroke -an unusual or allergic reaction to tadalafil, other medicines, foods, dyes, or preservatives -pregnant or trying to get pregnant -breast-feeding How should I use this medicine? Take this medicine by mouth with a glass of water. Follow the directions on the prescription label. You can take this medicine with or without food. Take your medicine at regular intervals. Take both tablets at the same time, one after the other. Do not split your dose throughout the day. Do not take it more often than directed. Do not stop taking except on your doctor's advice. Talk to your pediatrician regarding the use of this medicine in children. Special care may be needed. Overdosage: If you think you have taken too much of this medicine contact a poison control center or emergency room at once. NOTE: This medicine is only for you. Do not share this medicine with others. What if I miss a dose? If you miss a dose, take it as soon as you can. If it is almost time for your next dose, take only that dose. Do not take double or extra doses. What may interact with this medicine? Do not take this medicine with any of  the following medications: -nitrates like amyl nitrite, isosorbide dinitrate, isosorbide mononitrate, nitroglycerin -other medicines for erectile dysfunction like avanafil, sildenafil, vardenafil -other tadalafil products (Cialis) -riociguat This medicine may also interact with the following medications: -certain drugs for high blood pressure -certain drugs for the treatment of HIV infection or AIDS -certain drugs used for fungal or yeast infections, like fluconazole, itraconazole, ketoconazole, and voriconazole -certain drugs used for seizures like carbamazepine, phenytoin, and phenobarbital -grapefruit juice -macrolide antibiotics like clarithromycin, erythromycin, troleandomycin -medicines for prostate problems -rifabutin, rifampin or rifapentine This list may not describe all possible interactions. Give your health care provider a list of all the medicines, herbs, non-prescription drugs, or dietary supplements you use. Also tell them if you smoke, drink alcohol, or use illegal drugs. Some items may interact with your medicine. What should I watch for while using this medicine? Do not change the dose of your medicine without asking your doctor. Call your doctor if your medication is not working for you. This may be a sign that your condition has become worse. If you notice any changes in your vision while taking this drug, call your doctor or health care professional as soon as possible. Stop using this medicine and call your health care provider right away if you have a loss of sight in one or both eyes. For males, contact you doctor or health care professional right away if an erection lasts longer than 4 hours or if it becomes painful. This may be a sign of serious problem and must be treated right away to prevent permanent damage. If you experience  symptoms of nausea, dizziness, chest pain, or arm pain after taking this medicine, you should discuss the episode with your doctor or health  care professional as soon as possible. Do not drink more than 4 alcohol-containing drinks in a short period of time when taking this medicine. When taken in excess, alcohol can increase your chances of getting a headache or getting dizzy, increasing your heart rate or lowering your blood pressure. What side effects may I notice from receiving this medicine? Side effects that you should report to your doctor or health care professional as soon as possible: -allergic reactions like skin rash, itching or hives, swelling of the face, lips, or tongue -breathing problems -changes in hearing -changes in vision -chest pain -fast, irregular heartbeat Side effects that usually do not require medical attention (report to your doctor or health care professional if they continue or are bothersome): -back pain -dizziness -flushing -headache -indigestion -muscle aches -stuffy or runny nose This list may not describe all possible side effects. Call your doctor for medical advice about side effects. You may report side effects to FDA at 1-800-FDA-1088. Where should I keep my medicine? Keep out of the reach of children. Store at room temperature between 15 and 30 degrees C (59 and 86 degrees F). Throw away any unused medicine after the expiration date. NOTE: This sheet is a summary. It may not cover all possible information. If you have questions about this medicine, talk to your doctor, pharmacist, or health care provider.  2018 Elsevier/Gold Standard (2015-04-26 17:57:11)

## 2017-11-04 NOTE — Progress Notes (Signed)
Sick Visit  Subjective:    [name], male    DOB: 12-Aug-1965, 52 y.o.   MRN: [id number]   Chief Complaint  Patient presents with  . Follow-up    diabetes medication  . Erectile Dysfunction   HPI  Keith [name] has a past medical history of Hypertension, HIV, Erectile Dysfunction, Right BKA, and Chronic Pain.   Current Status: Since his last office visit, he continues to have problems with erectile dysfunction. His pain is stable today. He continues to follow up with Infection Disease. Otherwise he is doing well with no complaints.   He denies fevers, chills, fatigue, recent infections, weight loss, and night sweats. He has not had any headaches, visual changes, dizziness, and falls. No chest pain, heart palpitations, cough and shortness of breath reported. No reports of GI problems such as nausea, vomiting, diarrhea, and constipation. He has no reports of blood in stools, dysuria and hematuria. No depression or anxiety, and denies suicidal ideations, homicidal ideations, or auditory hallucinations. He denies pain today.   Past Medical History:  Diagnosis Date  . Abscess of right foot   . AIDS ([place])   . Chronic knee pain    right  . Chronic pain   . Diabetes type 2, uncontrolled ([place])    HgA1c 17.6 (04/27/2010)  . Diabetic foot ulcer ([place]) 01/2017   right foot  . Erectile dysfunction   . Genital warts   . HIV (human immunodeficiency virus infection) ([place]) 2009   CD4 count 100, VL 13800 (05/01/2010)  . Hypertension   . Osteomyelitis ([place])    h/o hand  . Osteomyelitis of metatarsal ([place]) 04/28/2017    Family History  Problem Relation Age of Onset  . Hypertension Mother   . Arthritis Father   . Hypertension Father   . Hypertension Brother   . Cancer Maternal Grandmother 90       unknown type of cancer  . Depression Paternal Grandmother     Social History   Socioeconomic History  . Marital status: Single    Spouse name: Not on file  . Number of children:  3  . Years of education: 41  . Highest education level: Not on file  Occupational History  . Not on file  Social Needs  . Financial resource strain: Not on file  . Food insecurity:    Worry: Not on file    Inability: Not on file  . Transportation needs:    Medical: Not on file    Non-medical: Not on file  Tobacco Use  . Smoking status: Never Smoker  . Smokeless tobacco: Never Used  Substance and Sexual Activity  . Alcohol use: No    Alcohol/week: 0.0 oz  . Drug use: No  . Sexual activity: Yes    Partners: Female    Comment: given condoms  Lifestyle  . Physical activity:    Days per week: Not on file    Minutes per session: Not on file  . Stress: Not on file  Relationships  . Social connections:    Talks on phone: Not on file    Gets together: Not on file    Attends religious service: Not on file    Active member of club or organization: Not on file    Attends meetings of clubs or organizations: Not on file    Relationship status: Not on file  . Intimate partner violence:    Fear of current or ex partner: Not on file  Emotionally abused: Not on file    Physically abused: Not on file    Forced sexual activity: Not on file  Other Topics Concern  . Not on file  Social History Narrative   Worked for the city of [place] for 18 years.   Unemployed.    Applying for disability.   Medicaid patient.    Past Surgical History:  Procedure Laterality Date  . AMPUTATION Right 05/02/2017   Procedure: AMPUTATION TRANSMETARSAL;  Surgeon: [name], MD;  Location: [facility];  Service: Orthopedics;  Laterality: Right;  . AMPUTATION Right 06/13/2017   Procedure: RIGHT BELOW KNEE AMPUTATION;  Surgeon: [name], MD;  Location: [facility];  Service: Orthopedics;  Laterality: Right;  . BELOW KNEE LEG AMPUTATION Right 06/13/2017  . HERNIA REPAIR    . I&D EXTREMITY Left 08/21/2014   Procedure: INCISION AND DRAINAGE LEFT SMALL FINGER;  Surgeon: Leanora Cover, MD;  Location: [facility];   Service: Orthopedics;  Laterality: Left;  . I&D EXTREMITY Right 03/18/2017   Procedure: IRRIGATION AND DEBRIDEMENT EXTREMITY;  Surgeon: [name], MD;  Location: [place] [facility];  Service: Orthopedics;  Laterality: Right;  . MINOR IRRIGATION AND DEBRIDEMENT OF WOUND Right 04/22/2014   Procedure: IRRIGATION AND DEBRIDEMENT OF RIGHT NECK ABCESS;  Surgeon: Jerrell Belfast, MD;  Location: [facility];  Service: ENT;  Laterality: Right;  . MULTIPLE EXTRACTIONS WITH ALVEOLOPLASTY N/A 01/18/2013   Procedure: MULTIPLE EXTRACION 3, 6, 7, 10, 11, 13, 21, 22, 27, 28, 29, 30 WITH ALVEOLOPLASTY;  Surgeon: Gae Bon, DDS;  Location: [facility];  Service: Oral Surgery;  Laterality: N/A;  . SKIN SPLIT GRAFT Right 03/21/2017   Procedure: IRRIGATION AND DEBRIDEMENT RIGHT FOOT AND APPLY SPLIT THICKNESS SKIN GRAFT AND WOUND VAC;  Surgeon: [name], MD;  Location: [facility];  Service: Orthopedics;  Laterality: Right;  . TEE WITHOUT CARDIOVERSION N/A 05/02/2017   Procedure: TRANSESOPHAGEAL ECHOCARDIOGRAM (TEE);  Surgeon: Acie Fredrickson Wonda Cheng, MD;  Location: [facility];  Service: Cardiovascular;  Laterality: N/A;  coincidental to orthopedic case    Immunization History  Administered Date(s) Administered  . Hepatitis B 05/19/2008  . Hepatitis B, adult 06/02/2013, 07/06/2014  . Influenza Split 04/23/2011  . Influenza Whole 03/24/2008, 01/05/2009, 04/18/2010  . Influenza,inj,Quad PF,6+ Mos 12/18/2012, 02/05/2014, 01/30/2015, 05/13/2016, 03/16/2017  . Meningococcal Mcv4o 05/13/2016  . Pneumococcal Polysaccharide-23 09/08/2006, 11/20/2012, 03/16/2017  . Tdap 05/30/2014    Current Meds  Medication Sig  . abacavir-dolutegravir-lamiVUDine (TRIUMEQ) 600-50-300 MG tablet Take 1 tablet by mouth daily.  . darunavir-cobicistat (PREZCOBIX) 800-150 MG tablet Take 1 tablet by mouth daily. Swallow whole. Do NOT crush, break or chew tablets. Take with food.  . escitalopram (LEXAPRO) 10 MG tablet Take 10 mg by mouth daily.  . Insulin Glargine  (LANTUS SOLOSTAR) 100 UNIT/ML Solostar Pen Inject 20 Units into the skin daily at 10 pm. (Patient taking differently: Inject 20 Units into the skin at bedtime. )  . levorphanol (LEVODROMORAN) 2 MG tablet Take 2 mg by mouth 3 (three) times daily.  Marland Kitchen LORazepam (ATIVAN) 1 MG tablet Take 1 tablet (1 mg total) by mouth every 6 (six) hours as needed for anxiety.  . Oxycodone HCl 10 MG TABS Take 1 tablet (10 mg total) by mouth 3 (three) times daily as needed.  Marland Kitchen XTAMPZA ER 9 MG C12A Take 1 capsule by mouth 2 (two) times daily.    Allergies  Allergen Reactions  . Sulfa Antibiotics Itching    BP (!) 140/96 (BP Location: Left Arm, Patient  Position: Sitting, Cuff Size: Large)   Pulse 80   Temp 97.8 F (36.6 C) (Oral)   Ht 5\' 9"  (1.753 m)   Wt 272 lb (123.4 kg)   SpO2 100%   BMI 40.17 kg/m   Review of Systems  Constitutional: Negative.   HENT: Negative.   Eyes: Negative.   Respiratory: Negative.   Cardiovascular: Negative.   Gastrointestinal: Negative.   Endocrine: Negative.   Genitourinary: Negative.   Musculoskeletal: Negative.   Skin: Negative.   Allergic/Immunologic: Negative.   Neurological: Negative.   Hematological: Negative.   Psychiatric/Behavioral: Negative.    Objective:   Physical Exam  Constitutional: He is oriented to person, place, and time. He appears well-developed and well-nourished.  HENT:  Head: Normocephalic and atraumatic.  Right Ear: External ear normal.  Left Ear: External ear normal.  Nose: Nose normal.  Mouth/Throat: Oropharynx is clear and moist.  Eyes: Conjunctivae and EOM are normal.  Neck: Normal range of motion. Neck supple.  Cardiovascular: Normal rate, regular rhythm, normal heart sounds and intact distal pulses.  Pulmonary/Chest: Effort normal and breath sounds normal.  Abdominal: Soft. Bowel sounds are normal.  Musculoskeletal:  Right below the knee amputation  Neurological: He is alert and oriented to person, place, and time.  Skin: Skin  is warm and dry. Capillary refill takes less than 2 seconds.  Psychiatric: He has a normal mood and affect. His behavior is normal. Judgment and thought content normal.  Nursing note and vitals reviewed.  Assessment & Plan:   1. Type 2 diabetes mellitus without complication, without long-term current use of insulin (HCC) Hgb has increased today at 6.4, from 5.4 on 08/27/2017. He has discontinued Amaryl. Continue Insulin as prescribed.   He will continue to decrease foods/beverages high in sugars and carbs and follow Heart Healthy or DASH diet. Increase physical activity to at least 30 minutes cardio exercise daily.   - POCT glycosylated hemoglobin (Hb A1C) - POCT urinalysis dipstick  2. Essential hypertension Blood pressure continues to be stable. He will continue Amlodipine and Lisinopril as prescribed.   3. Abnormal urinalysis - Urine Culture  4. HIV disease ([place]) He continues to follow up with Infection Disease. He has appointment this afternoon. Continue anti-viral medications as prescribed.  5. Chronic pain syndrome Stable. Continue Gabapentin and Lisinopril as prescribed.    6. Erectile dysfunction, unspecified erectile dysfunction type We will send Rx for Tadalafil to pharmacy today. Patient was educated on not taking with Nitrates.   7. Follow up He will follow up in 1 month.    No orders of the defined types were placed in this encounter.   Kathe Becton,  MSN, FNP-C Patient [facility] [address] [place], [place] [address] [phone]

## 2017-11-05 MED ORDER — TADALAFIL 20 MG PO TABS: 10.0000 mg | ORAL_TABLET | ORAL | 2 refills | Status: DC | PRN

## 2017-11-06 DIAGNOSIS — Z79899 Other long term (current) drug therapy: Secondary | ICD-10-CM | POA: Diagnosis not present

## 2017-11-12 ENCOUNTER — Telehealth: Payer: Self-pay

## 2017-11-12 NOTE — Telephone Encounter (Signed)
Tried to call patient and it states that call can't go through at this time

## 2017-11-13 ENCOUNTER — Other Ambulatory Visit (INDEPENDENT_AMBULATORY_CARE_PROVIDER_SITE_OTHER): Payer: Self-pay

## 2017-11-13 ENCOUNTER — Telehealth (INDEPENDENT_AMBULATORY_CARE_PROVIDER_SITE_OTHER): Payer: Self-pay | Admitting: Orthopedic Surgery

## 2017-11-13 DIAGNOSIS — E119 Type 2 diabetes mellitus without complications: Secondary | ICD-10-CM | POA: Diagnosis not present

## 2017-11-13 DIAGNOSIS — F411 Generalized anxiety disorder: Secondary | ICD-10-CM | POA: Diagnosis not present

## 2017-11-13 DIAGNOSIS — F329 Major depressive disorder, single episode, unspecified: Secondary | ICD-10-CM | POA: Diagnosis not present

## 2017-11-13 DIAGNOSIS — Z89511 Acquired absence of right leg below knee: Secondary | ICD-10-CM

## 2017-11-13 DIAGNOSIS — R5383 Other fatigue: Secondary | ICD-10-CM | POA: Diagnosis not present

## 2017-11-13 NOTE — Telephone Encounter (Signed)
Juliann Pulse from [facility] called requesting an RX be faxed to her at [phone] for Gait training.  CB#[phone].  Thank you.

## 2017-11-13 NOTE — Telephone Encounter (Signed)
Order for [facility] neuro rehab also faxed to [facility]

## 2017-11-13 NOTE — Progress Notes (Signed)
phy

## 2017-11-17 ENCOUNTER — Other Ambulatory Visit: Payer: Self-pay | Admitting: Family Medicine

## 2017-11-17 ENCOUNTER — Telehealth: Payer: Self-pay

## 2017-11-17 DIAGNOSIS — N529 Male erectile dysfunction, unspecified: Secondary | ICD-10-CM

## 2017-11-17 MED ORDER — SILDENAFIL CITRATE 50 MG PO TABS
50.0000 mg | ORAL_TABLET | ORAL | 1 refills | Status: DC | PRN
Start: 2017-11-17 — End: 2018-01-06

## 2017-11-17 NOTE — Progress Notes (Unsigned)
Rx for Sildenafil sent to pharmacy today.

## 2017-11-17 NOTE — Telephone Encounter (Signed)
Patient would like to have the generic for Viagra instead of Cialis to see insurance would pay for it.

## 2017-11-17 NOTE — Telephone Encounter (Signed)
Left a vm for patient to callback 

## 2017-11-27 IMAGING — DX DG FOOT COMPLETE 3+V*R*
3 series · 3 of 3 positions shown · non-contrast
Comparison: None.

CLINICAL DATA: Initial evaluation for soft tissue ulcer, evaluate
for osteomyelitis.

EXAM:
RIGHT FOOT COMPLETE - 3+ VIEW

[x foot ap right]
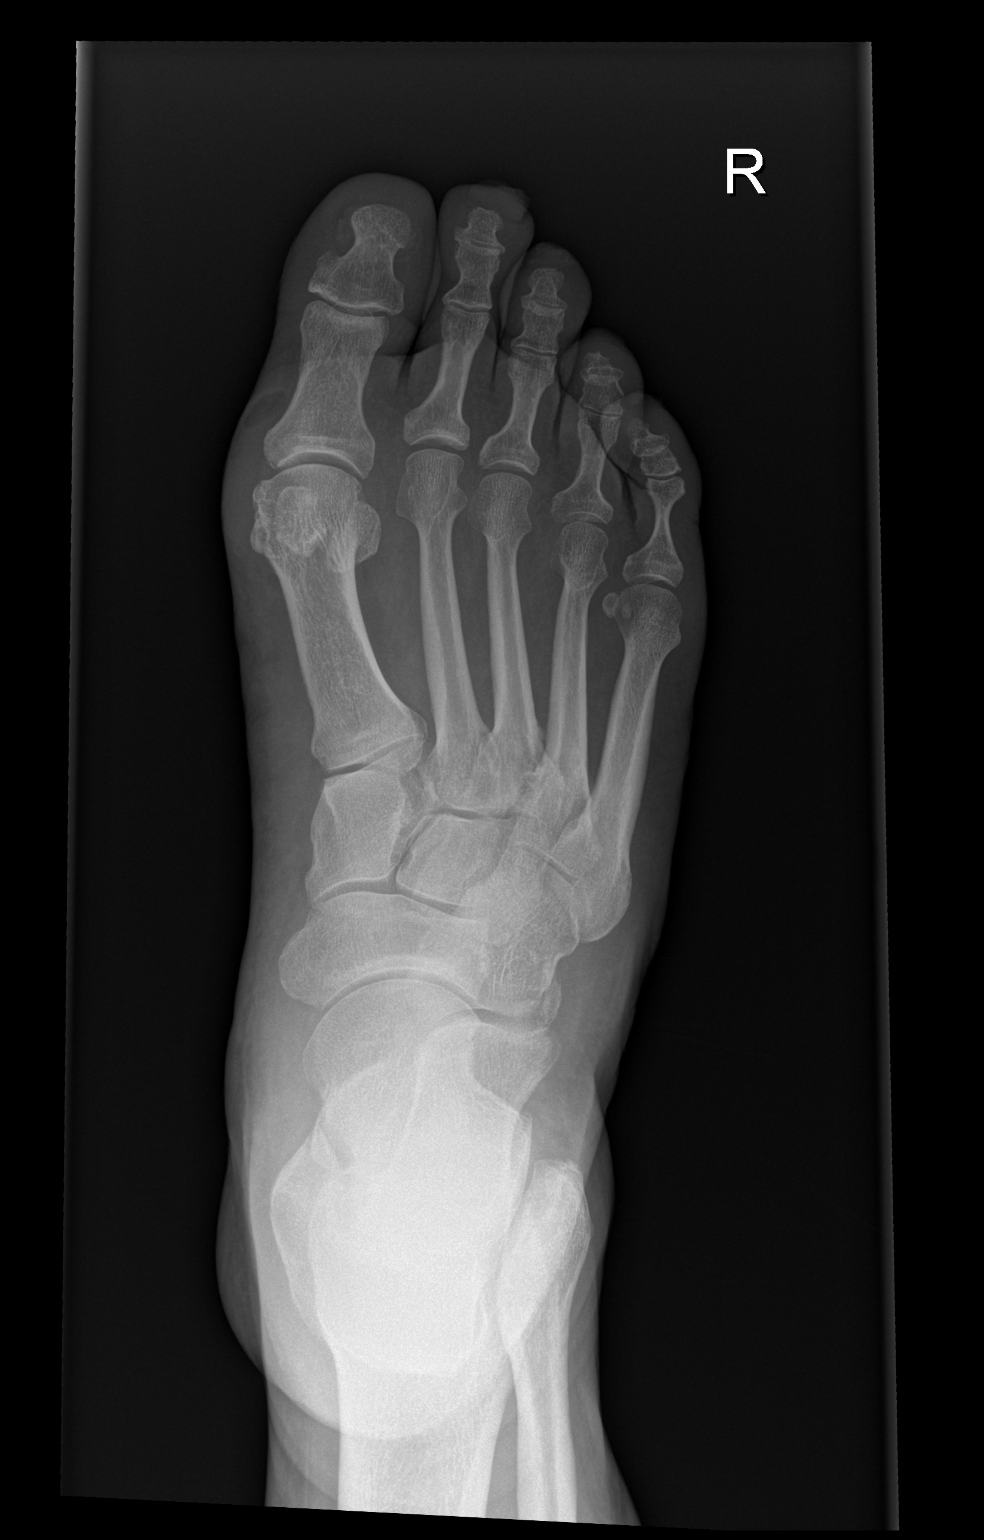

[x foot obl right]
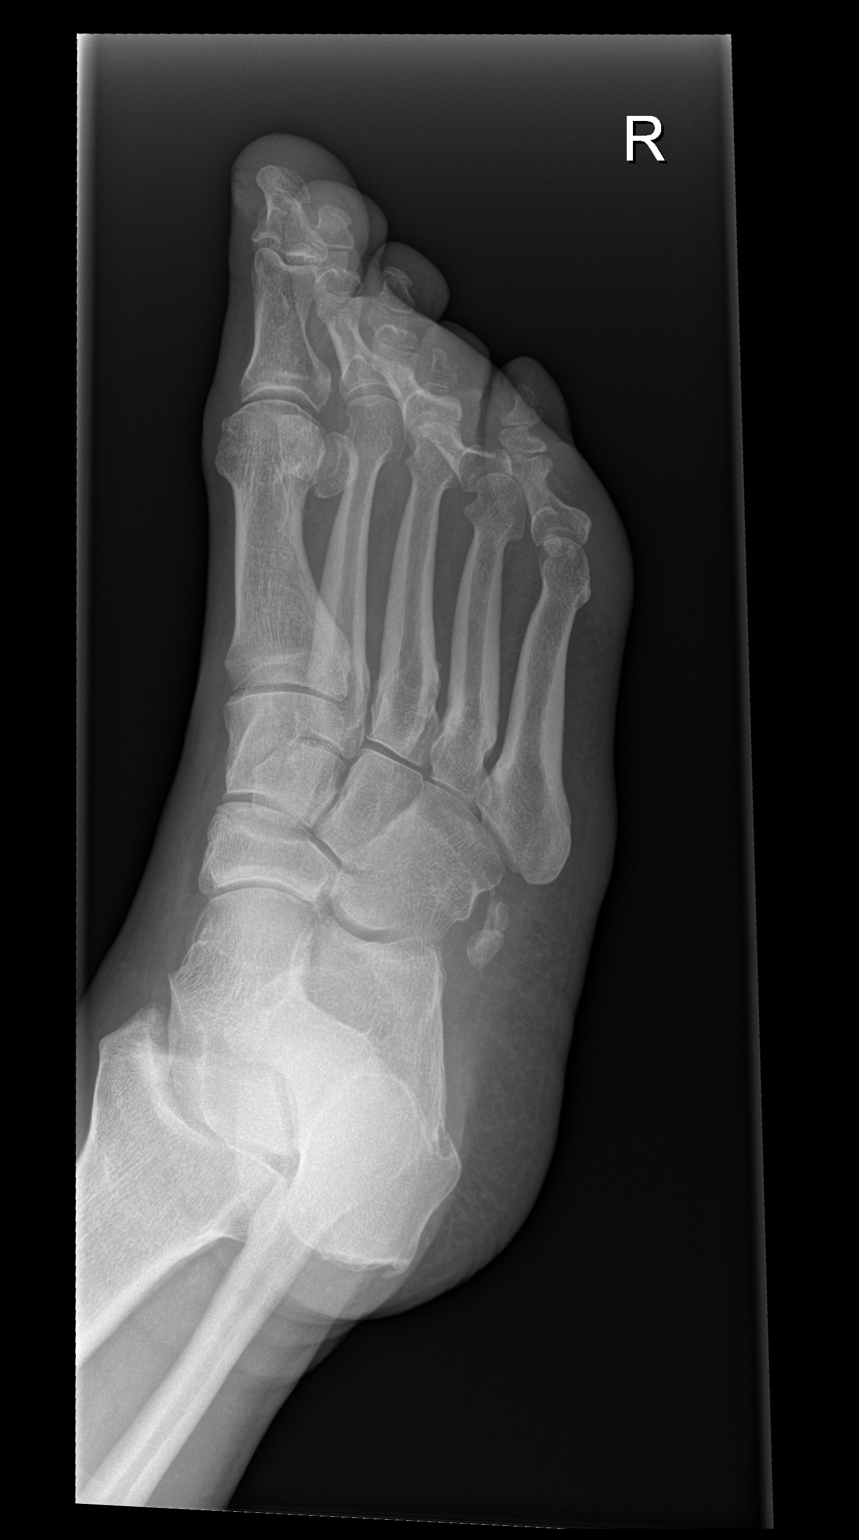

[x foot lat right]
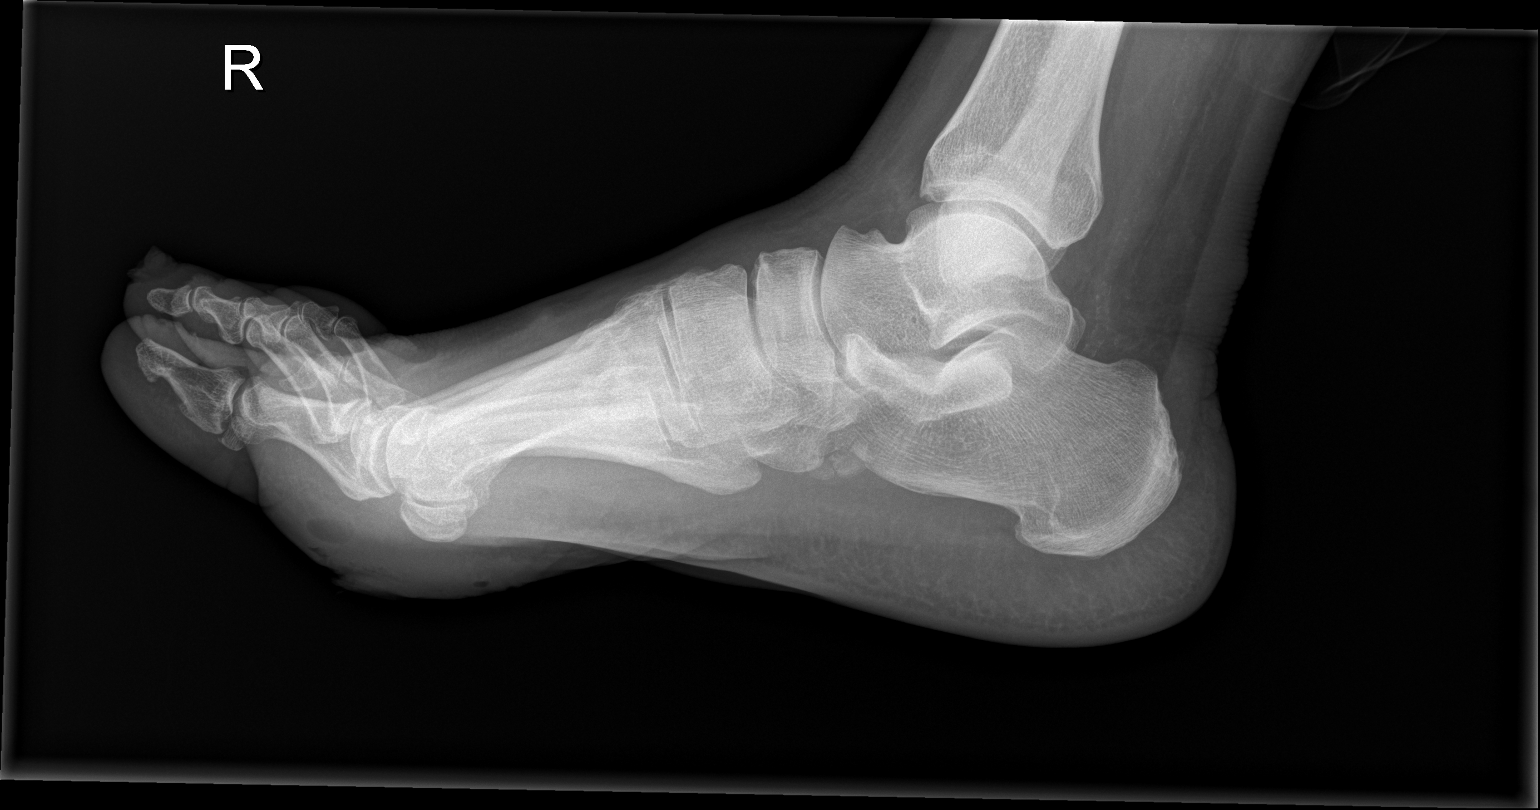

[3 of 3 positions shown; findings below may reference images not displayed]

FINDINGS: Soft tissue irregularity consistent with ulceration present at the
plantar aspect of the forefoot. No radiopaque foreign body or
dissecting soft tissue emphysema. No radiographic findings to
suggest active osteomyelitis. No acute fracture or dislocation.
IMPRESSION: 1. Soft tissue ulceration at the plantar and medial aspect of the
right forefoot. No radiopaque foreign body or dissecting soft tissue
emphysema. No evidence for osteomyelitis.
2. No other acute osseous abnormality about the foot.

## 2017-11-28 ENCOUNTER — Ambulatory Visit: Payer: Medicare Other | Admitting: Family Medicine

## 2017-12-02 ENCOUNTER — Ambulatory Visit: Payer: Medicare Other | Attending: Orthopedic Surgery | Admitting: Physical Therapy

## 2017-12-02 ENCOUNTER — Encounter: Payer: Self-pay | Admitting: Physical Therapy

## 2017-12-02 ENCOUNTER — Other Ambulatory Visit: Payer: Self-pay

## 2017-12-02 DIAGNOSIS — M6281 Muscle weakness (generalized): Secondary | ICD-10-CM | POA: Diagnosis not present

## 2017-12-02 DIAGNOSIS — R2681 Unsteadiness on feet: Secondary | ICD-10-CM

## 2017-12-02 DIAGNOSIS — R293 Abnormal posture: Secondary | ICD-10-CM | POA: Insufficient documentation

## 2017-12-02 DIAGNOSIS — R2689 Other abnormalities of gait and mobility: Secondary | ICD-10-CM | POA: Insufficient documentation

## 2017-12-02 DIAGNOSIS — Z79899 Other long term (current) drug therapy: Secondary | ICD-10-CM | POA: Diagnosis not present

## 2017-12-03 IMAGING — DX DG FOOT 2V*R*
2 series · 2 of 2 positions shown · non-contrast
Comparison: None.

CLINICAL DATA: Soft tissue ulceration plantar site right fifth toe.

EXAM:
RIGHT FOOT - 2 VIEW

[x foot lat right (1 of 2)]
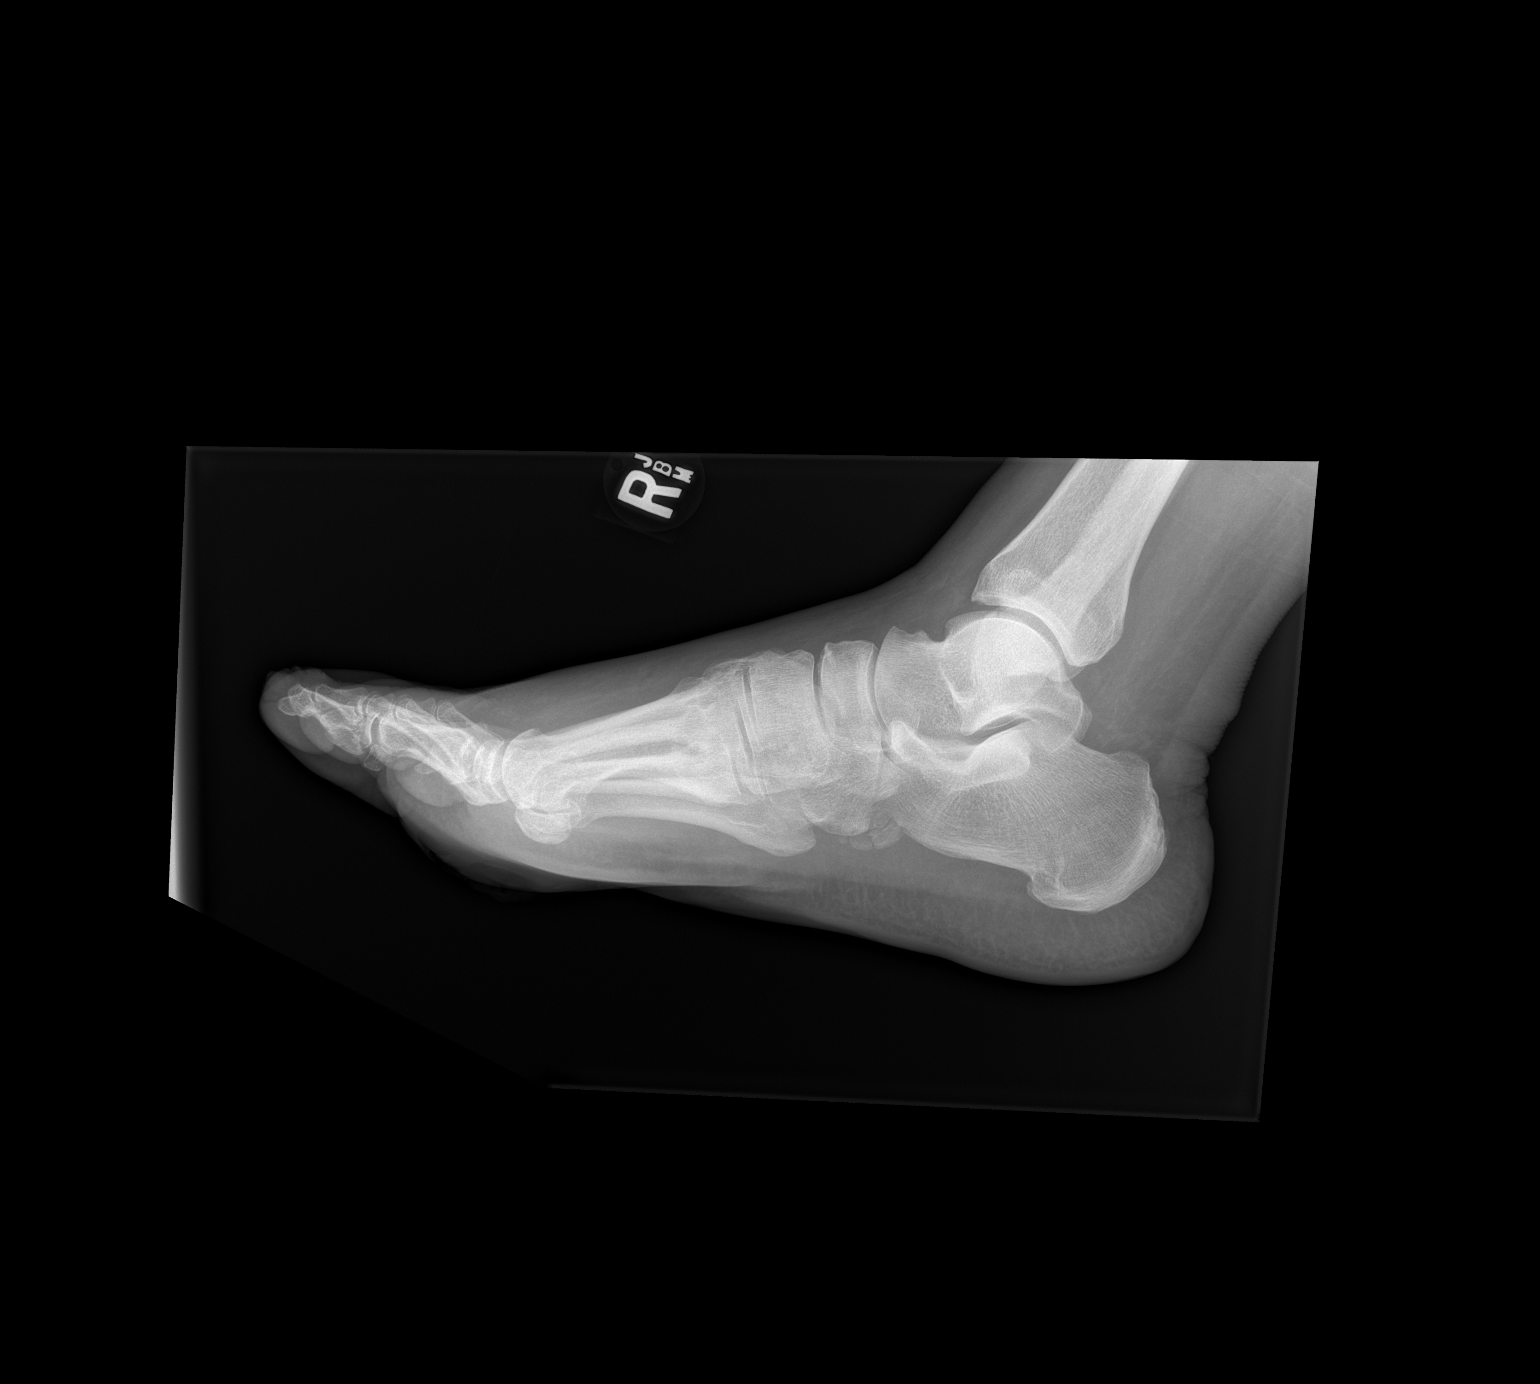

[x foot lat right (2 of 2)]
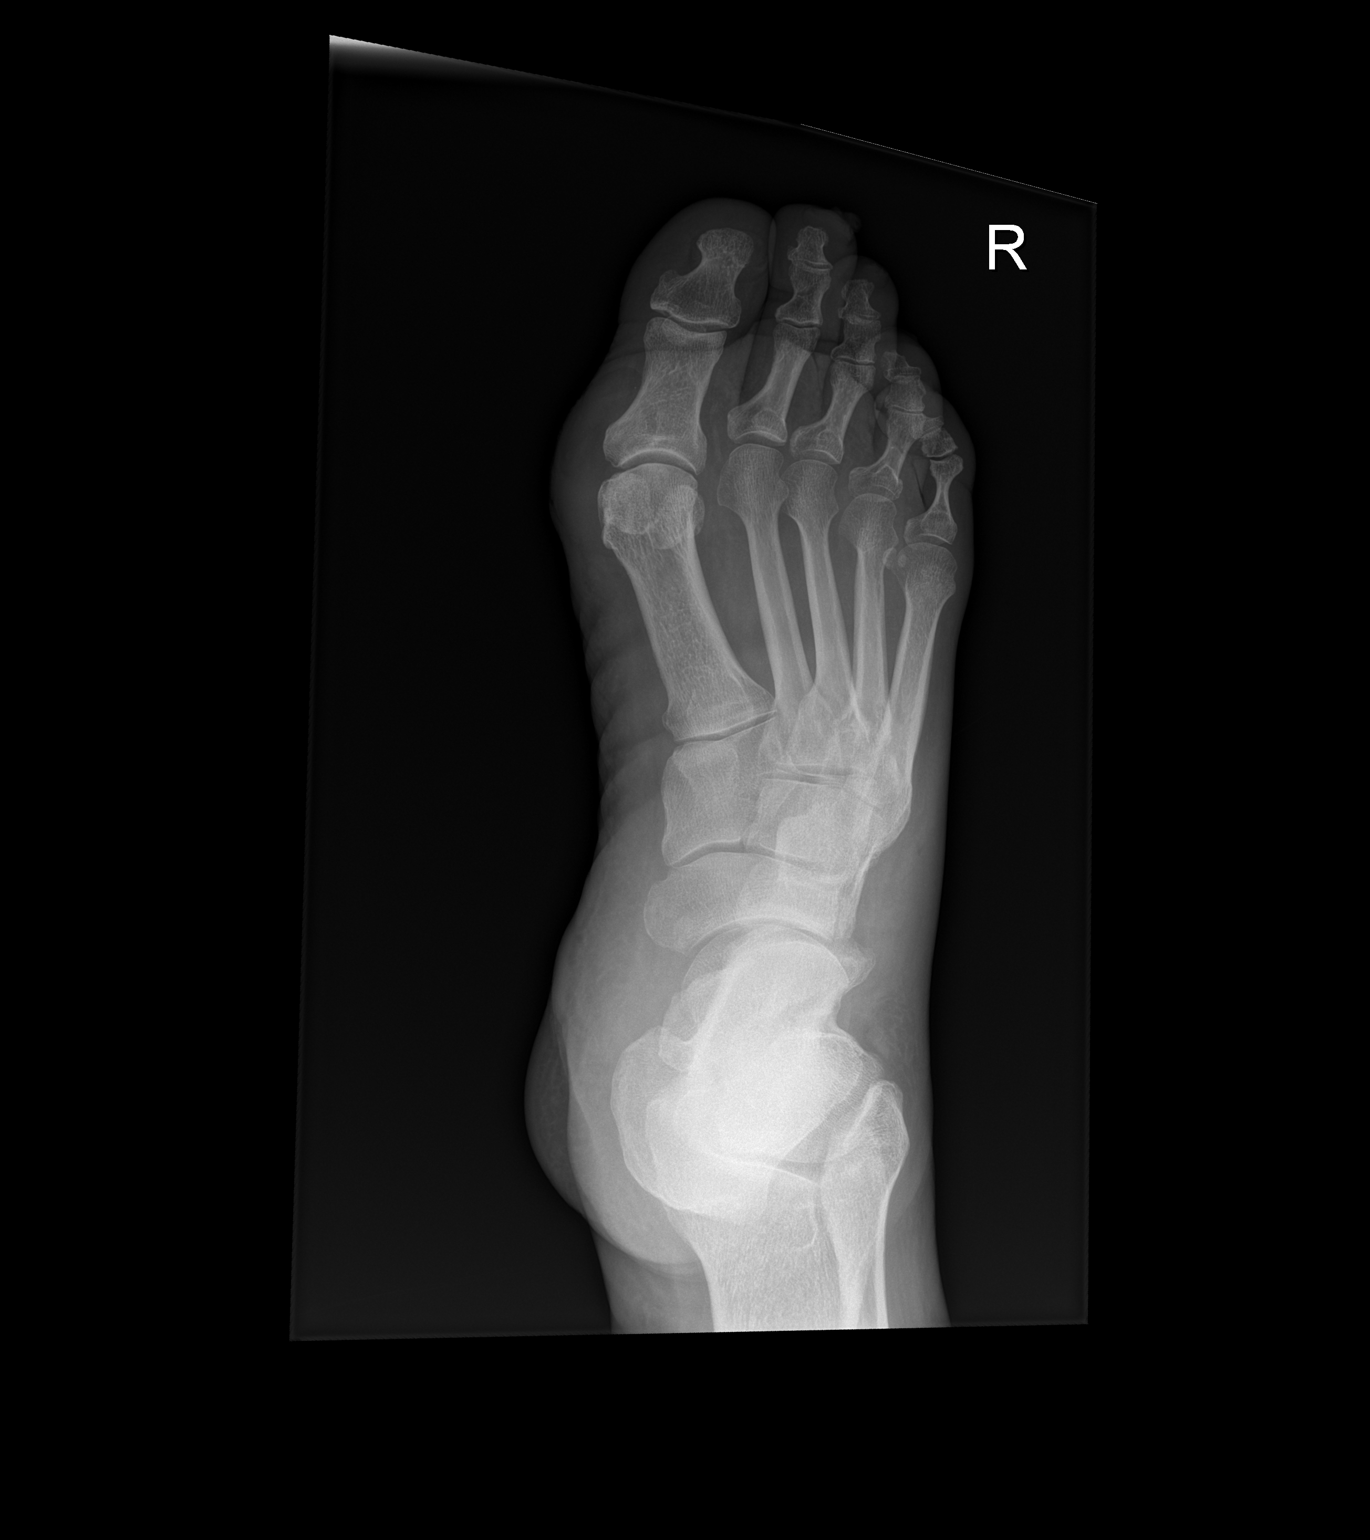

[2 of 2 positions shown; findings below may reference images not displayed]

FINDINGS: Mild hallux valgus deformity. No evidence of fracture dislocation.
Minimal degenerative change over the midfoot. Mild soft tissue
prominence and irregularity involving the plantar aspect at the
level of the MTP joints on the lateral film slightly improved. No
definite air within the soft tissues. No evidence of bone
destruction to suggest osteomyelitis.
IMPRESSION: Improving soft tissue prominence and irregularity along the plantar
aspect of the foot at the level of the MTP joints. No significant
air in the soft tissues and no evidence of osteomyelitis.

## 2017-12-03 NOTE — Therapy (Signed)
[facility] [address] [place], [place], [address] Phone: [phone]   Fax:  [phone]  Physical Therapy Evaluation  Patient Details  Name: Keith [name] MRN: [id number] Date of Birth: [birth year] Referring Provider: Meridee Score, MD   Encounter Date: 12/02/2017  PT End of Session - 12/02/17 2220    Visit Number  1    Number of Visits  25    Date for PT Re-Evaluation  02/27/18    Authorization Type  Medicare & Medicaid 20% co-insurance after Medicaid benefits exhausted.    PT Start Time  1530    PT Stop Time  1615    PT Time Calculation (min)  45 min    Equipment Utilized During Treatment  Gait belt    Activity Tolerance  Patient tolerated treatment well    Behavior During Therapy  WFL for tasks assessed/performed       Past Medical History:  Diagnosis Date  . Abscess of right foot   . AIDS ([place])   . Chronic knee pain    right  . Chronic pain   . Diabetes type 2, uncontrolled ([place])    HgA1c 17.6 (04/27/2010)  . Diabetic foot ulcer ([place]) 01/2017   right foot  . Erectile dysfunction   . Genital warts   . HIV (human immunodeficiency virus infection) ([place]) 2009   CD4 count 100, VL 13800 (05/01/2010)  . Hypertension   . Osteomyelitis ([place])    h/o hand  . Osteomyelitis of metatarsal ([place]) 04/28/2017    Past Surgical History:  Procedure Laterality Date  . AMPUTATION Right 05/02/2017   Procedure: AMPUTATION TRANSMETARSAL;  Surgeon: [name], MD;  Location: [facility];  Service: Orthopedics;  Laterality: Right;  . AMPUTATION Right 06/13/2017   Procedure: RIGHT BELOW KNEE AMPUTATION;  Surgeon: [name], MD;  Location: [facility];  Service: Orthopedics;  Laterality: Right;  . BELOW KNEE LEG AMPUTATION Right 06/13/2017  . HERNIA REPAIR    . I&D EXTREMITY Left 08/21/2014   Procedure: INCISION AND DRAINAGE LEFT SMALL FINGER;  Surgeon: Leanora Cover, MD;  Location: [facility];  Service: Orthopedics;  Laterality: Left;   . I&D EXTREMITY Right 03/18/2017   Procedure: IRRIGATION AND DEBRIDEMENT EXTREMITY;  Surgeon: [name], MD;  Location: [facility];  Service: Orthopedics;  Laterality: Right;  . MINOR IRRIGATION AND DEBRIDEMENT OF WOUND Right 04/22/2014   Procedure: IRRIGATION AND DEBRIDEMENT OF RIGHT NECK ABCESS;  Surgeon: Jerrell Belfast, MD;  Location: [facility];  Service: ENT;  Laterality: Right;  . MULTIPLE EXTRACTIONS WITH ALVEOLOPLASTY N/A 01/18/2013   Procedure: MULTIPLE EXTRACION 3, 6, 7, 10, 11, 13, 21, 22, 27, 28, 29, 30 WITH ALVEOLOPLASTY;  Surgeon: Gae Bon, DDS;  Location: [facility];  Service: Oral Surgery;  Laterality: N/A;  . SKIN SPLIT GRAFT Right 03/21/2017   Procedure: IRRIGATION AND DEBRIDEMENT RIGHT FOOT AND APPLY SPLIT THICKNESS SKIN GRAFT AND WOUND VAC;  Surgeon: [name], MD;  Location: [facility];  Service: Orthopedics;  Laterality: Right;  . TEE WITHOUT CARDIOVERSION N/A 05/02/2017   Procedure: TRANSESOPHAGEAL ECHOCARDIOGRAM (TEE);  Surgeon: Acie Fredrickson Wonda Cheng, MD;  Location: [facility];  Service: Cardiovascular;  Laterality: N/A;  coincidental to orthopedic case    There were no vitals filed for this visit.   Subjective Assessment - 12/02/17 1543    Subjective  This 52yo male was referred for PT evaluation by Meridee Score, MD. He underwent a right Transtibial Amputation on 06/13/2017. He received his prosthesis  11/18/2017.     Pertinent History  R TTA, CKD3, DM2, neuropathy, HIV, right knee arthritis,     Limitations  Lifting;Standing;Walking;House hold activities    Patient Stated Goals  walk in community, return to work in [employer]    Currently in Pain?  No/denies         [facility] PT Assessment - 12/02/17 1530      Assessment   Medical Diagnosis  Right Transtibial Amputation    Referring Provider  Meridee Score, MD    Onset Date/Surgical Date  11/18/17   prosthesis delivery   Hand Dominance  Right    Prior Therapy  [facility]PT       Precautions   Precautions  Fall      Balance Screen   Has  the patient fallen in the past 6 months  No    Has the patient had a decrease in activity level because of a fear of falling?   No    Is the patient reluctant to leave their home because of a fear of falling?   No      Home Environment   Living Environment  Private residence    Living Arrangements  Alone    Type of [place] to enter    Entrance Stairs-Number of Steps  2    Entrance Stairs-Rails  Right;Left;Cannot reach both    [place]  One level    [place] - 2 wheels;Tub bench      Prior Function   Level of Independence  Independent;Independent with household mobility with device;Independent with community mobility with device    Vocation  Full time employment    Leisure  basketball      Posture/Postural Control   Posture/Postural Control  Postural limitations    Postural Limitations  Rounded Shoulders;Forward head;Flexed trunk;Weight shift left      ROM / Strength   AROM / PROM / Strength  AROM;Strength      AROM   Overall AROM   Within functional limits for tasks performed      Strength   Overall Strength  Within functional limits for tasks performed    Overall Strength Comments  gross MMT in sitting WFL.  Functional strength noted decreased with right hip extension, abduction & knee extension.       Transfers   Transfers  Sit to Stand;Stand to Sit    Sit to Stand  6: Modified independent (Device/Increase time);With upper extremity assist;From chair/3-in-1;With armrests    Stand to Sit  6: Modified independent (Device/Increase time);With upper extremity assist;With armrests;To chair/3-in-1      Ambulation/Gait   Ambulation/Gait  Yes    Ambulation/Gait Assistance  5: Supervision    Ambulation/Gait Assistance Details  antalgic gait with increased deficit to stance duration on prosthesis >100'.     Ambulation Distance (Feet)  300 Feet   200' no device & 100' cane   Assistive device  Prosthesis;None;Straight cane    Gait  Pattern  Step-through pattern;Decreased arm swing - right;Decreased step length - left;Decreased stance time - right;Decreased stride length;Decreased hip/knee flexion - right;Decreased weight shift to right;Right hip hike;Right flexed knee in stance;Antalgic;Lateral hip instability;Trunk flexed;Abducted- right    Ambulation Surface  Indoor;Level    Gait velocity  3.53 ft/sec no device & 2.98 ft/sec cane    Stairs  Yes    Stairs Assistance  5: Supervision    Stair Management Technique  Two rails;Step to pattern;Forwards  Number of Stairs  4      Standardized Balance Assessment   Standardized Balance Assessment  Berg Balance Test      Berg Balance Test   Sit to Stand  Able to stand  independently using hands    Standing Unsupported  Able to stand safely 2 minutes    Sitting with Back Unsupported but Feet Supported on Floor or Stool  Able to sit 2 minutes under supervision    Stand to Sit  Controls descent by using hands    Transfers  Able to transfer safely, definite need of hands    Standing Unsupported with Eyes Closed  Able to stand 10 seconds with supervision    Standing Ubsupported with Feet Together  Able to place feet together independently and stand for 1 minute with supervision    From Standing, Reach Forward with Outstretched Arm  Can reach forward >12 cm safely (5")    From Standing Position, Pick up Object from Floor  Able to pick up shoe, needs supervision    From Standing Position, Turn to Look Behind Over each Shoulder  Turn sideways only but maintains balance    Turn 360 Degrees  Able to turn 360 degrees safely but slowly    Standing Unsupported, Alternately Place Feet on Step/Stool  Able to complete >2 steps/needs minimal assist    Standing Unsupported, One Foot in Front  Able to take small step independently and hold 30 seconds    Standing on One Leg  Able to lift leg independently and hold equal to or more than 3 seconds    Total Score  37      Functional Gait   Assessment   Gait assessed   Yes    Gait Level Surface  Walks 20 ft in less than 7 sec but greater than 5.5 sec, uses assistive device, slower speed, mild gait deviations, or deviates 6-10 in outside of the 12 in walkway width.    Change in Gait Speed  Makes only minor adjustments to walking speed, or accomplishes a change in speed with significant gait deviations, deviates 10-15 in outside the 12 in walkway width, or changes speed but loses balance but is able to recover and continue walking.    Gait with Horizontal Head Turns  Performs head turns with moderate changes in gait velocity, slows down, deviates 10-15 in outside 12 in walkway width but recovers, can continue to walk.    Gait with Vertical Head Turns  Performs task with moderate change in gait velocity, slows down, deviates 10-15 in outside 12 in walkway width but recovers, can continue to walk.    Gait and Pivot Turn  Turns slowly, requires verbal cueing, or requires several small steps to catch balance following turn and stop    Step Over Obstacle  Is able to step over one shoe box (4.5 in total height) but must slow down and adjust steps to clear box safely. May require verbal cueing.    Gait with Narrow Base of Support  Ambulates less than 4 steps heel to toe or cannot perform without assistance.    Gait with Eyes Closed  Cannot walk 20 ft without assistance, severe gait deviations or imbalance, deviates greater than 15 in outside 12 in walkway width or will not attempt task.    Ambulating Backwards  Walks 20 ft, slow speed, abnormal gait pattern, evidence for imbalance, deviates 10-15 in outside 12 in walkway width.    Steps  Two feet to a  stair, must use rail.    Total Score  9      Prosthetics Assessment - 12/02/17 1530      Prosthetics   Prosthetic Care Dependent with  Skin check;Residual limb care;Care of non-amputated limb;Prosthetic cleaning;Ply sock cleaning;Correct ply sock adjustment;Proper wear schedule/adjustment;Proper  weight-bearing schedule/adjustment    Donning prosthesis   Supervision    Doffing prosthesis   Supervision    Current prosthetic wear tolerance (days/week)   reports daily since delivery 14 days ago    Current prosthetic wear tolerance (#hours/day)   reports all awake hours for last week since delivery 14 days ago.  Skin is too moist with blisters / break down posteriorly popliteal to top of liner.  PT recommended wear 3-4 hours & off 1-2 hours 3 times per day.     Current prosthetic weight-bearing tolerance (hours/day)   Patient tolerated standing & gait with partial weight on prosthesis without pain.     Edema  pitting edema    Residual limb condition   blisters posterior popliteal & thigh, dark skin, over moist, bulbous towards cylinderical shape, minimal to no hair growth,                Objective measurements completed on examination: See above findings.      [facility] Adult PT Treatment/Exercise - 12/02/17 1530      Prosthetics   Prosthetic Care Comments   need to build up wear at slower rate to protect skin,     Education Provided  Skin check;Residual limb care;Prosthetic cleaning;Correct ply sock adjustment;Proper Donning;Proper wear schedule/adjustment    Person(s) Educated  Patient    Education Method  Explanation;Demonstration;Tactile cues;Verbal cues    Education Method  Verbalized understanding;Returned demonstration;Tactile cues required;Verbal cues required;Needs further instruction               PT Short Term Goals - 12/03/17 2058      PT SHORT TERM GOAL #1   Title  Patient verbalizes proper cleaning and demonstrates proper donning. (All STGs Target Date: 01/01/2018)    Time  1    Period  Months    Status  New    Target Date  01/01/18      PT SHORT TERM GOAL #2   Title  Patient tolerates prostheis wear daily >10hrs total /day with improved skin issues & no new skin issues.     Time  1    Period  Months    Status  New    Target Date  01/01/18       PT SHORT TERM GOAL #3   Title  Patient ambulates 300' on paved surfaces with cane & prosthesis with supervision / verbal cues.     Time  1    Period  Months    Status  New    Target Date  01/01/18      PT SHORT TERM GOAL #4   Title  Patient negotiates ramps, curbs & stairs 1 rail with cane & prosthesis with supervision.     Time  1    Period  Months    Status  New    Target Date  01/01/18      PT SHORT TERM GOAL #5   Title  Patient lifts 5# from floor and carries 100' with verbal cues only.     Time  1    Period  Months    Status  New    Target Date  01/01/18  PT Long Term Goals - 12/03/17 2052      PT LONG TERM GOAL #1   Title  Patient demonstrates & verbalizes understanding of proper prosthetic care to enable safe use of prosthesis. (All LTGs Target Date: 02/27/2018)    Time  3    Period  Months    Status  New    Target Date  02/27/18      PT LONG TERM GOAL #2   Title  Patient tolerates wear of prosthesis >90% of awake hours without skin or limb oain issues to enable function throughout day.     Time  3    Period  Months    Status  New    Target Date  02/27/18      PT LONG TERM GOAL #3   Title  Berg Balance >/= 47/56 to indicate lower fall risk.     Time  3    Period  Months    Status  New    Target Date  02/27/18      PT LONG TERM GOAL #4   Title  Functional Gait Assessent >/= 19/30 to indicate lower fall risk.     Time  3    Period  Months    Status  New    Target Date  02/27/18      PT LONG TERM GOAL #5   Title  Patient ambulates >1000' outdoors including grass, ramps & curbs with prosthesis only modified independent for community mobility.     Time  3    Period  Months    Status  New    Target Date  02/27/18      PT LONG TERM GOAL #6   Title  Patient demonstrates work simluation lifting/carrying/pushing/ pulling tasks with prostheis safely.    Time  3    Period  Months    Status  New    Target Date  02/27/18             Plan -  12/03/17 2040    Clinical Impression Statement  This 52yo male underwent a right Transtibial Amputation due to vascular issues & infection and received his first prosthesis 11/18/2017. He is dependent in proper prosthetic care impacting safe use of prosthesis. He progressed  wear too rapidly and has blisters/skin breakdown in popliteal & posterior thigh area under liner. Patient has high fall risk as noted by Merrilee Jansky Balance score 37/56. He ambulated into session without device with prosthesis only with significant gait deviations including decreased stance duration & abduction. Functional Gait Assessment 9/30 indicates high fall risk also. Patient appears would benefit from skilled PT services to improve function & safety with prosthesis.     History and Personal Factors relevant to plan of care:  Patient lives alone. He has medical history of vscular issues / infectious disease.     Clinical Presentation  Evolving    Clinical Presentation due to:  blisters on limb from improper, too rapid increase in prosthesis wear, prosthetic dependency, high fall risk    Clinical Decision Making  Moderate    Rehab Potential  Good    Clinical Impairments Affecting Rehab Potential  R TTA, CKD3, DM2, neuropathy, HIV, right knee arthritis    PT Frequency  2x / week    PT Duration  12 weeks    PT Treatment/Interventions  ADLs/Self Care Home Management;Canalith Repostioning;Gait training;DME Instruction;Stair training;Functional mobility training;Therapeutic activities;Therapeutic exercise;Balance training;Neuromuscular re-education;Patient/family education;Prosthetic Training;Manual techniques;Vestibular    PT Next Visit Plan  review prosthetic care, HEP at sink / high level balance, prosthetic gait with cane including ramps, curbs & stairs    Consulted and Agree with Plan of Care  Patient       Patient will benefit from skilled therapeutic intervention in order to improve the following deficits and impairments:   Abnormal gait, Decreased activity tolerance, Decreased balance, Decreased endurance, Decreased knowledge of use of DME, Decreased mobility, Decreased strength, Dizziness, Postural dysfunction, Prosthetic Dependency, Obesity, Pain  Visit Diagnosis: Muscle weakness (generalized)  Unsteadiness on feet  Other abnormalities of gait and mobility  Abnormal posture     Problem List Patient Active Problem List   Diagnosis Date Noted  . Below knee amputation status, right ([place]) 06/13/2017  . S/P transmetatarsal amputation of foot, right ([place]) 05/07/2017  . Wound infection   . Bacteremia   . Fever   . Sepsis ([place]) 03/15/2017  . CKD (chronic kidney disease) stage 3, GFR 30-59 ml/min ([facility]) 03/15/2017  . Diabetic polyneuropathy associated with type 2 diabetes mellitus ([place]) 01/23/2017  . Right foot ulcer, limited to breakdown of skin ([place]) 01/07/2017  . AKI (acute kidney injury) ([place]) 09/28/2016  . Nausea vomiting and diarrhea 09/28/2016  . Onychomycosis of multiple toenails with type 2 diabetes mellitus ([place]) 08/29/2015  . MRSA carrier 04/20/2014  . Testosterone deficiency 04/20/2014  . Penile wart 02/22/2014  . HIV disease ([place])   . Insulin-requiring or dependent type II diabetes mellitus ([place]) 02/04/2014  . Dental anomaly 11/20/2012  . Arthritis of right knee 02/23/2012  . Hyperlipidemia 11/10/2011  . Chronic pain 08/07/2011  . Meralgia paraesthetica 04/23/2011  . ERECTILE DYSFUNCTION 08/22/2008  . Essential hypertension 05/19/2008    Jamey Reas PT, DPT 12/03/2017, 9:03 PM  [facility] [address] [place], [place], [address] Phone: [phone]   Fax:  [phone]  Name: Keith [name] MRN: [id number] Date of Birth: May 07, 1965

## 2017-12-04 IMAGING — MR MR FOOT*R* W/O CM
4 of 5 series · 24 of 40 positions shown · non-contrast
Comparison: Radiographs from 01/06/2017

CLINICAL DATA: Worsening right foot ulcer. Diabetes. Swollen right
foot.

EXAM:
MRI OF THE RIGHT FOREFOOT WITHOUT CONTRAST
TECHNIQUE: Multiplanar, multisequence MR imaging of the right forefoot was
performed. No intravenous contrast was administered.

[Series 5: T1 · coronal · 5.0mm · 0.27mm/px · 9 of 29 slices shown (1 of 2)]
[im 1/29]
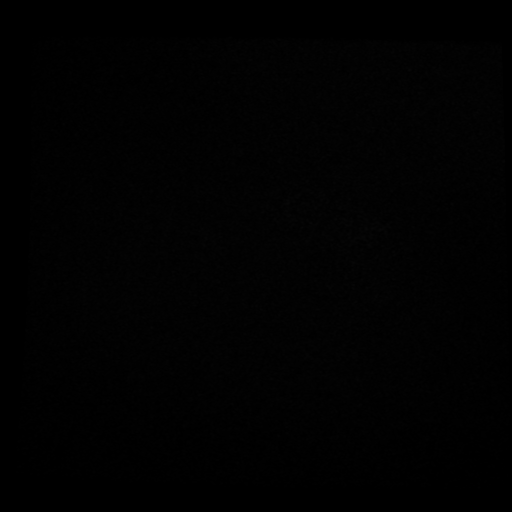
[im 4/29]
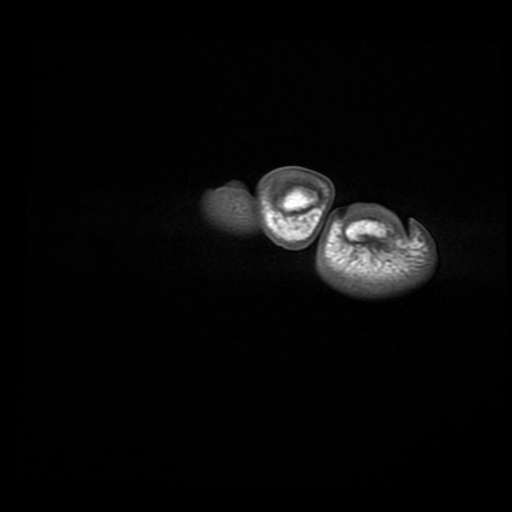
[im 8/29]
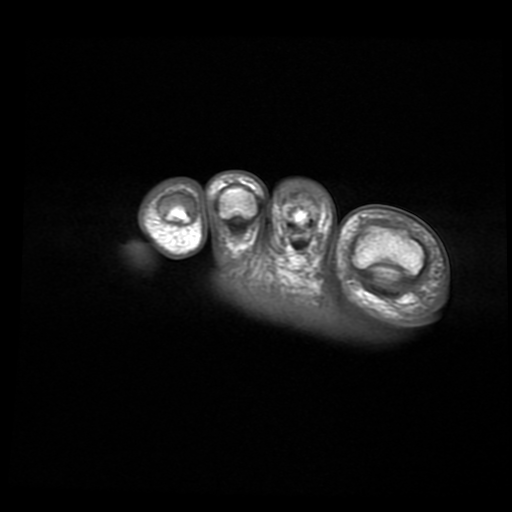
[im 11/29]
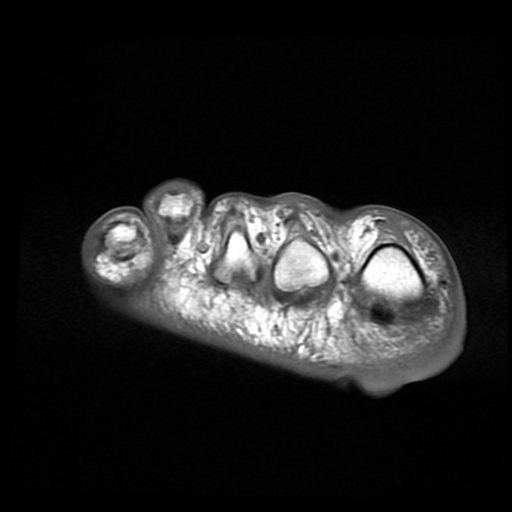
[im 15/29]
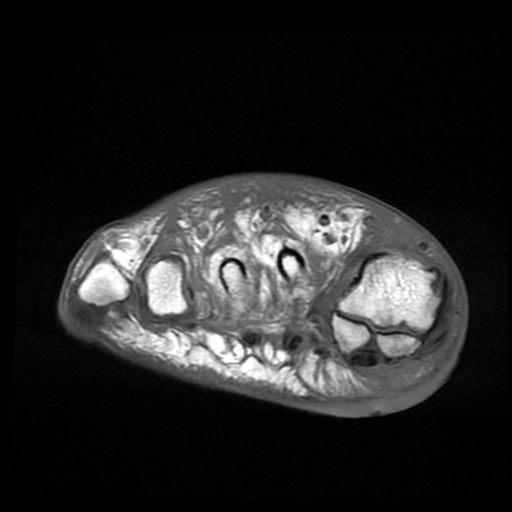
[im 18/29]
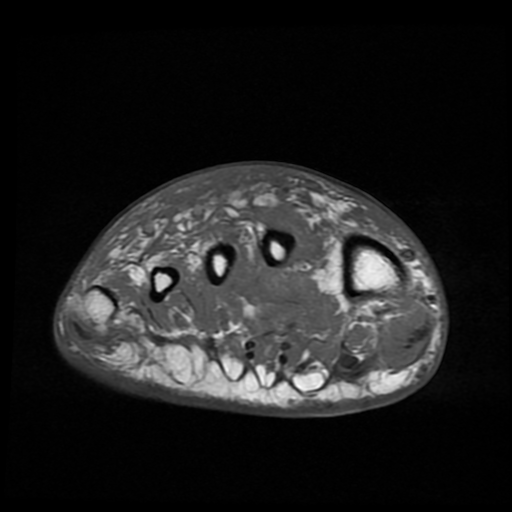
[im 22/29]
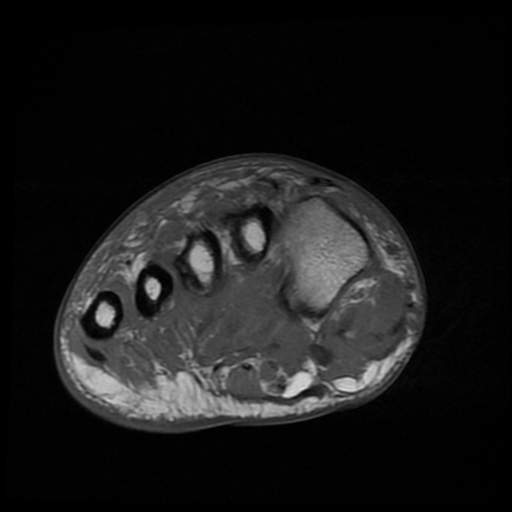
[im 25/29]
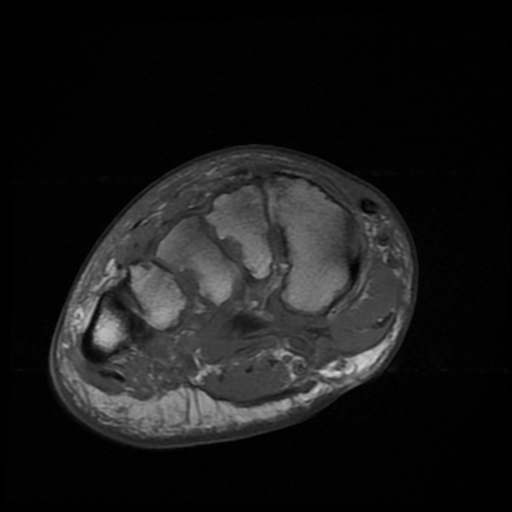
[im 29/29]
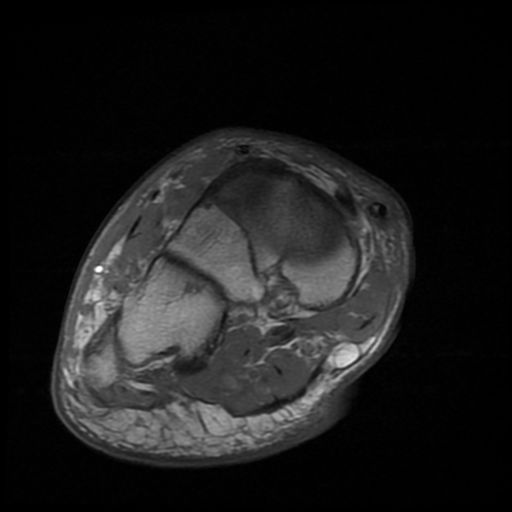

[Series 6: T2 fat-sat · coronal · 5.0mm · 0.55mm/px · 8 of 29 slices shown]
[im 1/29]
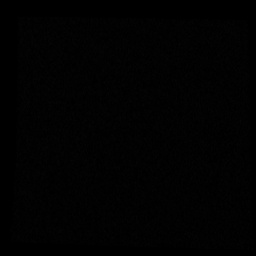
[im 4/29]
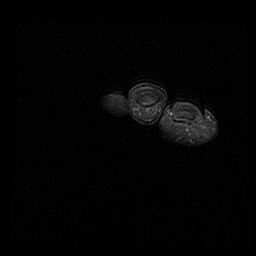
[im 10/29]
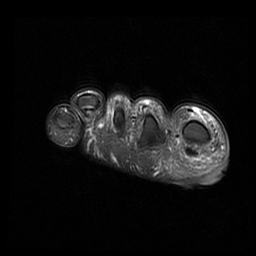
[im 13/29]
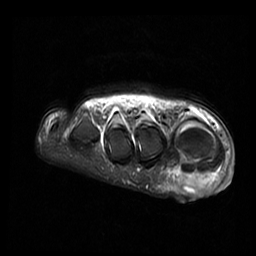
[im 16/29]
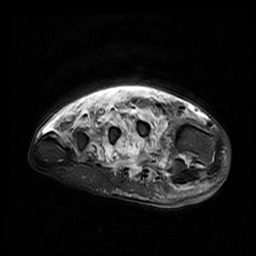
[im 19/29]
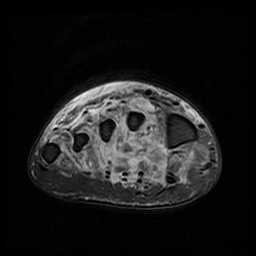
[im 25/29]
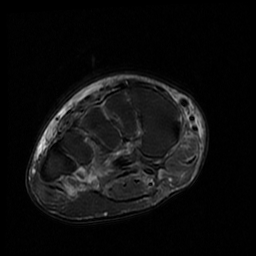
[im 29/29]
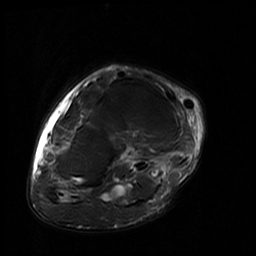

[Series 7: STIR · axial · 4.0mm · 0.35mm/px · z∈[-199,-146]mm · 3 of 18 slices shown]
[im 4/18]
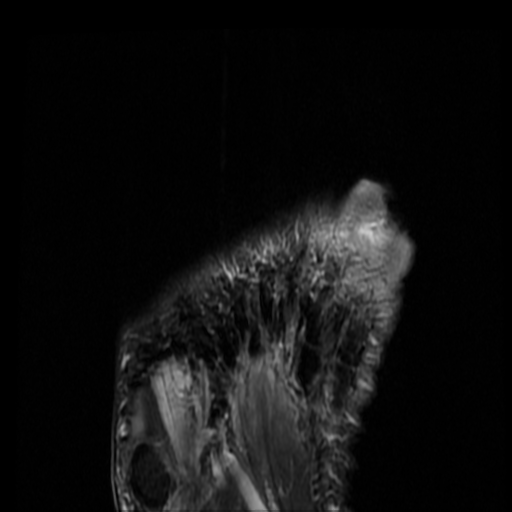
[im 11/18]
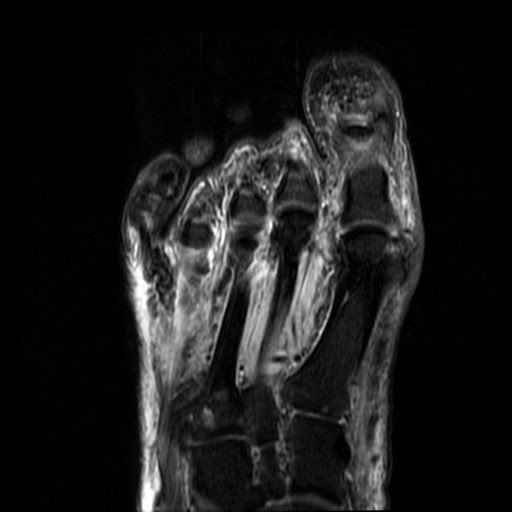
[im 18/18]
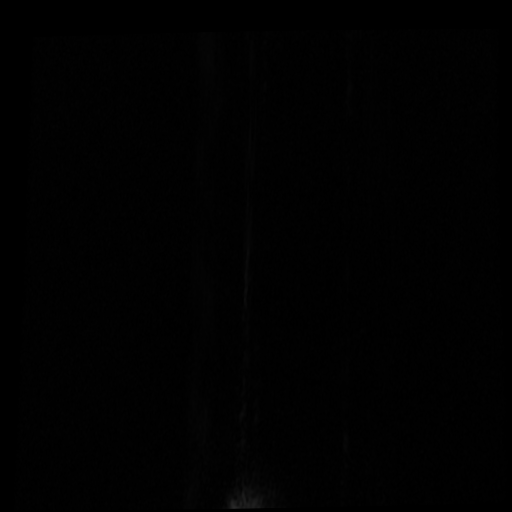

[Series 8: T1 · axial · 4.0mm · 0.35mm/px · z∈[-210,-146]mm · 4 of 18 slices shown (2 of 2)]
[im 1/18]
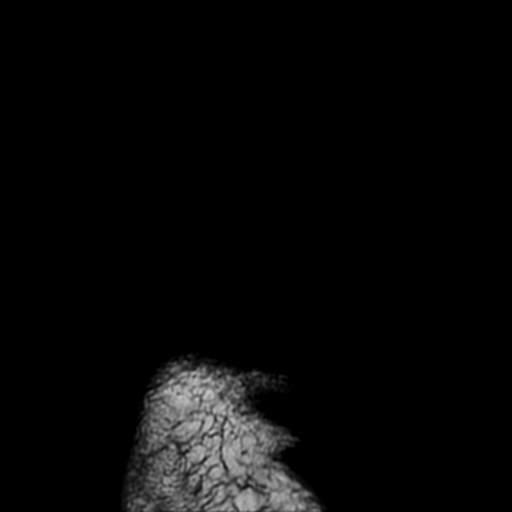
[im 4/18]
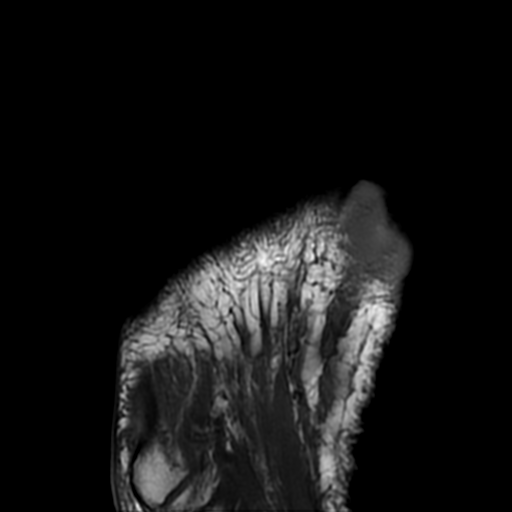
[im 11/18]
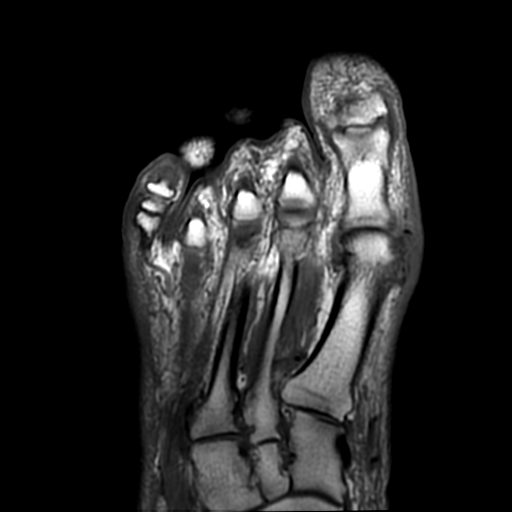
[im 18/18]
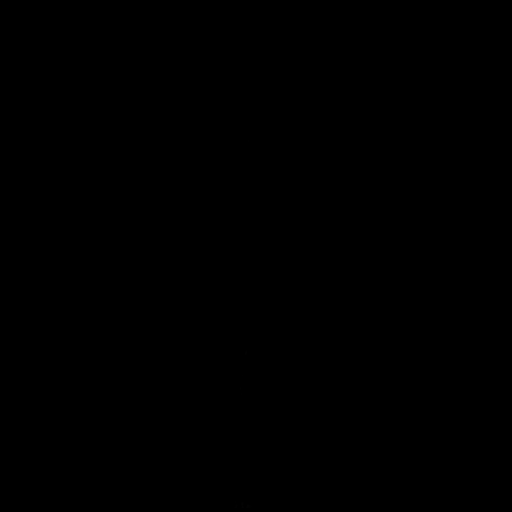

[24 of 40 positions shown; findings below may reference images not displayed]

FINDINGS: Bones/Joint/Cartilage

No significant bony destructive findings or marrow edema to suggest
osteomyelitis.

Mild erosive arthropathy along the midfoot, Lisfranc joint, and
proximal intermetatarsal articulations.

First digit sesamoids appear unremarkable. Mild degenerative
arthropathy of the first MTP joint.

Ligaments

Lisfranc ligament intact.

Muscles and Tendons

Low-level diffuse edema in the plantar musculature of the foot.

Soft tissues

Cutaneous ulceration plantar to the first MTP joint. There is
potentially a miniscule fluid collection draining to the ulceration
on image [DATE] but no drainable abscess. Localized cellulitis
tracking into the great toe.

Dorsal subcutaneous edema along the forefoot is nonspecific but
could also be due to cellulitis.
IMPRESSION: 1. Cutaneous ulceration plantar to the first MTP joint with
surrounding cellulitis tracking into the great toe. Possible
cellulitis along the dorsum of the foot.
2. Low-level diffuse edema in the plantar musculature of the foot.
3. No osteomyelitis or drainable abscess.
4. Erosive arthropathy in the midfoot, Lisfranc joint, along and
along the proximal intermetatarsal articulations.

## 2017-12-05 ENCOUNTER — Ambulatory Visit: Payer: Medicare Other | Admitting: Family Medicine

## 2017-12-12 ENCOUNTER — Encounter

## 2017-12-15 ENCOUNTER — Ambulatory Visit: Payer: Medicare Other | Admitting: Physical Therapy

## 2017-12-17 ENCOUNTER — Ambulatory Visit: Payer: Medicare Other | Attending: Orthopedic Surgery | Admitting: Physical Therapy

## 2017-12-17 DIAGNOSIS — F329 Major depressive disorder, single episode, unspecified: Secondary | ICD-10-CM | POA: Diagnosis not present

## 2017-12-17 DIAGNOSIS — E7800 Pure hypercholesterolemia, unspecified: Secondary | ICD-10-CM | POA: Diagnosis not present

## 2017-12-17 DIAGNOSIS — F411 Generalized anxiety disorder: Secondary | ICD-10-CM | POA: Diagnosis not present

## 2017-12-22 ENCOUNTER — Ambulatory Visit: Payer: Medicare Other | Admitting: Physical Therapy

## 2017-12-24 ENCOUNTER — Ambulatory Visit: Payer: Medicare Other | Admitting: Physical Therapy

## 2017-12-25 ENCOUNTER — Encounter: Payer: Self-pay | Admitting: Infectious Diseases

## 2017-12-29 ENCOUNTER — Encounter: Payer: Medicare Other | Admitting: Physical Therapy

## 2017-12-31 ENCOUNTER — Ambulatory Visit: Payer: Medicare Other | Admitting: Physical Therapy

## 2018-01-01 ENCOUNTER — Encounter: Payer: Self-pay | Admitting: Physical Therapy

## 2018-01-02 ENCOUNTER — Telehealth: Payer: Self-pay

## 2018-01-02 DIAGNOSIS — G894 Chronic pain syndrome: Secondary | ICD-10-CM | POA: Diagnosis not present

## 2018-01-02 DIAGNOSIS — G546 Phantom limb syndrome with pain: Secondary | ICD-10-CM | POA: Diagnosis not present

## 2018-01-02 DIAGNOSIS — Z79899 Other long term (current) drug therapy: Secondary | ICD-10-CM | POA: Diagnosis not present

## 2018-01-05 ENCOUNTER — Other Ambulatory Visit: Payer: Self-pay | Admitting: Family Medicine

## 2018-01-05 ENCOUNTER — Ambulatory Visit (INDEPENDENT_AMBULATORY_CARE_PROVIDER_SITE_OTHER): Payer: Medicare Other | Admitting: Orthopedic Surgery

## 2018-01-05 DIAGNOSIS — E119 Type 2 diabetes mellitus without complications: Secondary | ICD-10-CM

## 2018-01-05 MED ORDER — ONETOUCH ULTRASOFT LANCETS MISC: 12 refills | Status: DC

## 2018-01-05 MED ORDER — INSULIN SYRINGES (DISPOSABLE) U-100 0.3 ML MISC: 1.0000 | Freq: Three times a day (TID) | 11 refills | Status: DC

## 2018-01-05 MED ORDER — GLUCOSE BLOOD VI STRP: ORAL_STRIP | 12 refills | Status: DC

## 2018-01-05 NOTE — Progress Notes (Unsigned)
Lancets, syringes, and test strips sent to pharmacy today.

## 2018-01-06 ENCOUNTER — Other Ambulatory Visit: Payer: Medicare Other

## 2018-01-06 ENCOUNTER — Encounter: Payer: Self-pay | Admitting: Family Medicine

## 2018-01-06 ENCOUNTER — Ambulatory Visit (INDEPENDENT_AMBULATORY_CARE_PROVIDER_SITE_OTHER): Payer: Medicare Other | Admitting: Family Medicine

## 2018-01-06 VITALS — BP 128/82 | HR 60 | Temp 97.6°F | Ht 69.0 in | Wt 280.0 lb

## 2018-01-06 DIAGNOSIS — G894 Chronic pain syndrome: Secondary | ICD-10-CM | POA: Diagnosis not present

## 2018-01-06 DIAGNOSIS — E119 Type 2 diabetes mellitus without complications: Secondary | ICD-10-CM

## 2018-01-06 DIAGNOSIS — I10 Essential (primary) hypertension: Secondary | ICD-10-CM | POA: Diagnosis not present

## 2018-01-06 DIAGNOSIS — G8929 Other chronic pain: Secondary | ICD-10-CM | POA: Diagnosis not present

## 2018-01-06 DIAGNOSIS — N529 Male erectile dysfunction, unspecified: Secondary | ICD-10-CM

## 2018-01-06 DIAGNOSIS — M545 Low back pain, unspecified: Secondary | ICD-10-CM

## 2018-01-06 DIAGNOSIS — Z09 Encounter for follow-up examination after completed treatment for conditions other than malignant neoplasm: Secondary | ICD-10-CM | POA: Diagnosis not present

## 2018-01-06 LAB — POCT GLYCOSYLATED HEMOGLOBIN (HGB A1C): Hemoglobin A1C: 6.4 % — AB (ref 4.0–5.6)

## 2018-01-06 MED ORDER — SILDENAFIL CITRATE 50 MG PO TABS: 50.0000 mg | ORAL_TABLET | ORAL | 1 refills | Status: DC | PRN

## 2018-01-06 NOTE — Progress Notes (Addendum)
Follow Up  Subjective:    [name], male    DOB: March 13, 1966, 52 y.o.   MRN: [id number]   Chief Complaint  Patient presents with  . Follow-up    Chronic condition    HPI  Keith [name] is a 52 year old male with a past medical history of Osteomyelitis, Hypertension, HIV, Genital Warts, Erectile Dysfunction, Diabetes, Diabetic Foot Ulcer, Chronic Pain, Chronic Knee Pain, AIDS, and Right BKA. He is here today for follow up.    Current Status: Since his last office visit, he is doing well. He is s/p: Right BKA. He is wearing his prosthetic leg today. He reports that he continues to have chronic pain, but no longer wants to go to pain management clinic in [place], because of privacy issues. He currently is not taking prescribed Hypertensive medications. He denies visual changes, chest pain, cough, shortness of breath, heart palpitations, and falls. He has occasionally headaches and dizziness with position changes. Denies severe headaches, confusion, seizures, double vision, and blurred vision, nausea and vomiting.  He denies fevers, chills, fatigue, recent infections, weight loss, and night sweats. No reports of GI problems such as diarrhea, and constipation. He has no reports of blood in stools, dysuria and hematuria. No depression or anxiety reported.    Past Medical History:  Diagnosis Date  . Abscess of right foot   . AIDS ([place])   . Chronic knee pain    right  . Chronic pain   . Diabetes type 2, uncontrolled ([place])    HgA1c 17.6 (04/27/2010)  . Diabetic foot ulcer ([place]) 01/2017   right foot  . Erectile dysfunction   . Genital warts   . HIV (human immunodeficiency virus infection) ([place]) 2009   CD4 count 100, VL 13800 (05/01/2010)  . Hypertension   . Osteomyelitis ([place])    h/o hand  . Osteomyelitis of metatarsal ([place]) 04/28/2017    Family History  Problem Relation Age of Onset  . Hypertension Mother   . Arthritis Father   . Hypertension Father   . Hypertension  Brother   . Cancer Maternal Grandmother 23       unknown type of cancer  . Depression Paternal Grandmother     Social History   Socioeconomic History  . Marital status: Single    Spouse name: Not on file  . Number of children: 3  . Years of education: 46  . Highest education level: Not on file  Occupational History  . Not on file  Social Needs  . Financial resource strain: Not on file  . Food insecurity:    Worry: Not on file    Inability: Not on file  . Transportation needs:    Medical: Not on file    Non-medical: Not on file  Tobacco Use  . Smoking status: Never Smoker  . Smokeless tobacco: Never Used  Substance and Sexual Activity  . Alcohol use: No    Alcohol/week: 0.0 standard drinks  . Drug use: No  . Sexual activity: Yes    Partners: Female    Comment: given condoms  Lifestyle  . Physical activity:    Days per week: Not on file    Minutes per session: Not on file  . Stress: Not on file  Relationships  . Social connections:    Talks on phone: Not on file    Gets together: Not on file    Attends religious service: Not on file    Active member of club or  organization: Not on file    Attends meetings of clubs or organizations: Not on file    Relationship status: Not on file  . Intimate partner violence:    Fear of current or ex partner: Not on file    Emotionally abused: Not on file    Physically abused: Not on file    Forced sexual activity: Not on file  Other Topics Concern  . Not on file  Social History Narrative   Worked for the city of [place] for 18 years.   Unemployed.    Applying for disability.   Medicaid patient.    Past Surgical History:  Procedure Laterality Date  . AMPUTATION Right 05/02/2017   Procedure: AMPUTATION TRANSMETARSAL;  Surgeon: [name], MD;  Location: [facility];  Service: Orthopedics;  Laterality: Right;  . AMPUTATION Right 06/13/2017   Procedure: RIGHT BELOW KNEE AMPUTATION;  Surgeon: [name], MD;  Location: [facility];  Service: Orthopedics;  Laterality: Right;  . BELOW KNEE LEG AMPUTATION Right 06/13/2017  . HERNIA REPAIR    . I&D EXTREMITY Left 08/21/2014   Procedure: INCISION AND DRAINAGE LEFT SMALL FINGER;  Surgeon: Leanora Cover, MD;  Location: [facility];  Service: Orthopedics;  Laterality: Left;  . I&D EXTREMITY Right 03/18/2017   Procedure: IRRIGATION AND DEBRIDEMENT EXTREMITY;  Surgeon: [name], MD;  Location: [facility];  Service: Orthopedics;  Laterality: Right;  . MINOR IRRIGATION AND DEBRIDEMENT OF WOUND Right 04/22/2014   Procedure: IRRIGATION AND DEBRIDEMENT OF RIGHT NECK ABCESS;  Surgeon: Jerrell Belfast, MD;  Location: [facility];  Service: ENT;  Laterality: Right;  . MULTIPLE EXTRACTIONS WITH ALVEOLOPLASTY N/A 01/18/2013   Procedure: MULTIPLE EXTRACION 3, 6, 7, 10, 11, 13, 21, 22, 27, 28, 29, 30 WITH ALVEOLOPLASTY;  Surgeon: Gae Bon, DDS;  Location: [facility];  Service: Oral Surgery;  Laterality: N/A;  . SKIN SPLIT GRAFT Right 03/21/2017   Procedure: IRRIGATION AND DEBRIDEMENT RIGHT FOOT AND APPLY SPLIT THICKNESS SKIN GRAFT AND WOUND VAC;  Surgeon: [name], MD;  Location: [facility];  Service: Orthopedics;  Laterality: Right;  . TEE WITHOUT CARDIOVERSION N/A 05/02/2017   Procedure: TRANSESOPHAGEAL ECHOCARDIOGRAM (TEE);  Surgeon: Acie Fredrickson Wonda Cheng, MD;  Location: [facility] OR;  Service: Cardiovascular;  Laterality: N/A;  coincidental to orthopedic case    Immunization History  Administered Date(s) Administered  . Hepatitis B 05/19/2008  . Hepatitis B, adult 06/02/2013, 07/06/2014  . Influenza Split 04/23/2011  . Influenza Whole 03/24/2008, 01/05/2009, 04/18/2010  . Influenza,inj,Quad PF,6+ Mos 12/18/2012, 02/05/2014, 01/30/2015, 05/13/2016, 03/16/2017  . Meningococcal Mcv4o 05/13/2016  . Pneumococcal Polysaccharide-23 09/08/2006, 11/20/2012, 03/16/2017  . Tdap 05/30/2014    Current Meds  Medication Sig  . abacavir-dolutegravir-lamiVUDine (TRIUMEQ) 600-50-300 MG tablet Take 1 tablet by mouth  daily.  . darunavir-cobicistat (PREZCOBIX) 800-150 MG tablet Take 1 tablet by mouth daily. Swallow whole. Do NOT crush, break or chew tablets. Take with food.  Marland Kitchen glucose blood (COOL BLOOD GLUCOSE TEST STRIPS) test strip Use as instructed  . Insulin Glargine (LANTUS SOLOSTAR) 100 UNIT/ML Solostar Pen Inject 20 Units into the skin daily at 10 pm. (Patient taking differently: Inject 20 Units into the skin at bedtime. )  . Insulin Syringes, Disposable, U-100 0.3 ML MISC 1 Syringe by Does not apply route 3 (three) times daily.  . Lancets (ONETOUCH ULTRASOFT) lancets Use as instructed  . LORazepam (ATIVAN) 1 MG tablet Take 1 tablet (1 mg total) by mouth every 6 (six) hours as needed for anxiety.  Allergies  Allergen Reactions  . Sulfa Antibiotics Itching    BP 128/82 (BP Location: Left Arm, Patient Position: Sitting, Cuff Size: Large)   Pulse 60   Temp 97.6 F (36.4 C) (Oral)   Ht 5\' 9"  (1.753 m)   Wt 280 lb (127 kg)   SpO2 100%   BMI 41.35 kg/m    Review of Systems  Respiratory: Negative.   Cardiovascular: Negative.   Gastrointestinal: Positive for abdominal distention (Obese).  Genitourinary: Negative.   Musculoskeletal: Positive for arthralgias (Generalized ).  Neurological: Negative.   Psychiatric/Behavioral: Negative.    Objective:   Physical Exam  Constitutional: He appears well-developed and well-nourished.  Cardiovascular: Normal rate, regular rhythm, normal heart sounds and intact distal pulses.  Pulmonary/Chest: Effort normal and breath sounds normal.  Musculoskeletal:  Right below the knee amputation  Nursing note and vitals reviewed.  Assessment & Plan:   1. Type 2 diabetes mellitus without complication, without long-term current use of insulin (HCC) Hgb A1c stable at 6.4 today. He will continue Amaryl and Lantus as prescribed. She will continue to decrease foods/beverages high in sugars and carbs and follow Heart Healthy or DASH diet. Increase physical activity  to at least 30 minutes cardio exercise daily.  - POCT glycosylated hemoglobin (Hb A1C)  2. Essential hypertension Blood pressure is stable at 128/82 today. She will continue to decrease high sodium intake, excessive alcohol intake, increase potassium intake, smoking cessation, and increase physical activity of at least 30 minutes of cardio activity daily. She will continue to follow Heart Healthy or DASH diet.  3. Chronic pain syndrome - Ambulatory referral to Pain Clinic  4. Chronic low back pain without sciatica, unspecified back pain laterality Continue pain medications as prescribed.   5. Erectile dysfunction, unspecified erectile dysfunction type - sildenafil (VIAGRA) 50 MG tablet; Take 1 tablet (50 mg total) by mouth as needed for erectile dysfunction.  Dispense: 20 tablet; Refill: 1  6. Follow up He will follow up in 3 months.  - POCT urinalysis dipstick   Meds ordered this encounter  Medications  . sildenafil (VIAGRA) 50 MG tablet    Sig: Take 1 tablet (50 mg total) by mouth as needed for erectile dysfunction.    Dispense:  20 tablet    Refill:  [place],  MSN, FNP-C [facility] [address] [place], [place] [address] [phone]

## 2018-01-06 NOTE — Telephone Encounter (Signed)
-----   Message from Keith [name], [place] sent at 01/05/2018 10:49 PM EDT ----- Regarding: "Insulin Supplies" [name],   Please inform patient that insulin supplies were sent to pharmacy today.   Thank you.

## 2018-01-06 NOTE — Telephone Encounter (Signed)
Patient notified

## 2018-01-07 ENCOUNTER — Ambulatory Visit: Payer: Medicare Other | Attending: Orthopedic Surgery | Admitting: Physical Therapy

## 2018-01-08 ENCOUNTER — Ambulatory Visit: Payer: Medicare Other | Admitting: Physical Therapy

## 2018-01-12 ENCOUNTER — Encounter: Payer: Medicare Other | Admitting: Physical Therapy

## 2018-01-12 ENCOUNTER — Other Ambulatory Visit: Payer: Medicare Other

## 2018-01-15 ENCOUNTER — Telehealth: Payer: Self-pay

## 2018-01-15 ENCOUNTER — Encounter: Payer: Medicare Other | Admitting: Physical Therapy

## 2018-01-15 NOTE — Telephone Encounter (Signed)
Patient given information to [facility]

## 2018-01-19 ENCOUNTER — Other Ambulatory Visit: Payer: Self-pay

## 2018-01-19 DIAGNOSIS — N521 Erectile dysfunction due to diseases classified elsewhere: Principal | ICD-10-CM

## 2018-01-19 DIAGNOSIS — E1159 Type 2 diabetes mellitus with other circulatory complications: Secondary | ICD-10-CM

## 2018-01-19 MED ORDER — INSULIN PEN NEEDLE 31G X 5 MM MISC: 20.0000 [IU] | 2 refills | Status: DC

## 2018-01-19 MED ORDER — INSULIN GLARGINE 100 UNIT/ML SOLOSTAR PEN: 20.0000 [IU] | PEN_INJECTOR | Freq: Every day | SUBCUTANEOUS | 3 refills | Status: DC

## 2018-01-19 NOTE — Telephone Encounter (Signed)
Medication sent to pharmacy and referral sent to [facility].

## 2018-01-26 ENCOUNTER — Emergency Department (HOSPITAL_COMMUNITY): Payer: Medicare Other

## 2018-01-26 ENCOUNTER — Encounter (HOSPITAL_COMMUNITY): Payer: Self-pay | Admitting: Emergency Medicine

## 2018-01-26 ENCOUNTER — Emergency Department (HOSPITAL_COMMUNITY)
Admission: EM | Admit: 2018-01-26 | Discharge: 2018-01-26 | Disposition: A | Discharge status: Home / Self Care | Payer: Medicare Other | Attending: Emergency Medicine | Admitting: Emergency Medicine

## 2018-01-26 ENCOUNTER — Encounter: Payer: Medicare Other | Admitting: Infectious Diseases

## 2018-01-26 DIAGNOSIS — B20 Human immunodeficiency virus [HIV] disease: Secondary | ICD-10-CM | POA: Diagnosis not present

## 2018-01-26 DIAGNOSIS — Z794 Long term (current) use of insulin: Secondary | ICD-10-CM | POA: Insufficient documentation

## 2018-01-26 DIAGNOSIS — J069 Acute upper respiratory infection, unspecified: Secondary | ICD-10-CM | POA: Diagnosis not present

## 2018-01-26 DIAGNOSIS — I129 Hypertensive chronic kidney disease with stage 1 through stage 4 chronic kidney disease, or unspecified chronic kidney disease: Secondary | ICD-10-CM | POA: Insufficient documentation

## 2018-01-26 DIAGNOSIS — Z79899 Other long term (current) drug therapy: Secondary | ICD-10-CM | POA: Insufficient documentation

## 2018-01-26 DIAGNOSIS — R05 Cough: Secondary | ICD-10-CM | POA: Diagnosis not present

## 2018-01-26 DIAGNOSIS — N183 Chronic kidney disease, stage 3 (moderate): Secondary | ICD-10-CM | POA: Insufficient documentation

## 2018-01-26 DIAGNOSIS — E1122 Type 2 diabetes mellitus with diabetic chronic kidney disease: Secondary | ICD-10-CM | POA: Diagnosis not present

## 2018-01-26 DIAGNOSIS — M79661 Pain in right lower leg: Secondary | ICD-10-CM | POA: Insufficient documentation

## 2018-01-26 DIAGNOSIS — R0602 Shortness of breath: Secondary | ICD-10-CM | POA: Diagnosis not present

## 2018-01-26 DIAGNOSIS — G8929 Other chronic pain: Secondary | ICD-10-CM | POA: Insufficient documentation

## 2018-01-26 MED ORDER — IPRATROPIUM-ALBUTEROL 0.5-2.5 (3) MG/3ML IN SOLN
3.0000 mL | Freq: Once | RESPIRATORY_TRACT | Status: AC
  Administered 2018-01-26: 3 mL via RESPIRATORY_TRACT
  Filled 2018-01-26: qty 3

## 2018-01-26 MED ORDER — AZITHROMYCIN 250 MG PO TABS: 250.0000 mg | ORAL_TABLET | Freq: Every day | ORAL | 0 refills | Status: DC

## 2018-01-26 NOTE — ED Provider Notes (Signed)
[facility] EMERGENCY DEPARTMENT Provider Note   CSN: [id number] Arrival date & time: 01/26/18  1135     History   Chief Complaint Chief Complaint  Patient presents with  . Cough  . URI  . Medication Refill    HPI Keith [name] is a 52 y.o. male.  HPI   52 year old male presents today with complaints of chronic leg pain.  He notes his right lower extremity is chronically painful, denies any sores to the area.  Patient notes that he was referred to pain management but has not been seen by them.  Patient also notes for the last 3 weeks he has had cough.  Patient notes he felt feverish today.   Past Medical History:  Diagnosis Date  . Abscess of right foot   . AIDS ([place])   . Chronic knee pain    right  . Chronic pain   . Diabetes type 2, uncontrolled ([place])    HgA1c 17.6 (04/27/2010)  . Diabetic foot ulcer ([place]) 01/2017   right foot  . Erectile dysfunction   . Genital warts   . HIV (human immunodeficiency virus infection) ([place]) 2009   CD4 count 100, VL 13800 (05/01/2010)  . Hypertension   . Osteomyelitis ([place])    h/o hand  . Osteomyelitis of metatarsal ([place]) 04/28/2017    Patient Active Problem List   Diagnosis Date Noted  . Below knee amputation status, right 06/13/2017  . S/P transmetatarsal amputation of foot, right ([place]) 05/07/2017  . Wound infection   . Bacteremia   . Fever   . Sepsis ([place]) 03/15/2017  . CKD (chronic kidney disease) stage 3, GFR 30-59 ml/min (HCC) 03/15/2017  . Diabetic polyneuropathy associated with type 2 diabetes mellitus ([place]) 01/23/2017  . Right foot ulcer, limited to breakdown of skin ([place]) 01/07/2017  . AKI (acute kidney injury) ([place]) 09/28/2016  . Nausea vomiting and diarrhea 09/28/2016  . Onychomycosis of multiple toenails with type 2 diabetes mellitus ([place]) 08/29/2015  . MRSA carrier 04/20/2014  . Testosterone deficiency 04/20/2014  . Penile wart 02/22/2014  . HIV disease ([place])   . Insulin-requiring or  dependent type II diabetes mellitus ([place]) 02/04/2014  . Dental anomaly 11/20/2012  . Arthritis of right knee 02/23/2012  . Hyperlipidemia 11/10/2011  . Chronic pain 08/07/2011  . Meralgia paraesthetica 04/23/2011  . ERECTILE DYSFUNCTION 08/22/2008  . Essential hypertension 05/19/2008    Past Surgical History:  Procedure Laterality Date  . AMPUTATION Right 05/02/2017   Procedure: AMPUTATION TRANSMETARSAL;  Surgeon: [name], MD;  Location: [facility];  Service: Orthopedics;  Laterality: Right;  . AMPUTATION Right 06/13/2017   Procedure: RIGHT BELOW KNEE AMPUTATION;  Surgeon: [name], MD;  Location: [facility];  Service: Orthopedics;  Laterality: Right;  . BELOW KNEE LEG AMPUTATION Right 06/13/2017  . HERNIA REPAIR    . I&D EXTREMITY Left 08/21/2014   Procedure: INCISION AND DRAINAGE LEFT SMALL FINGER;  Surgeon: Leanora Cover, MD;  Location: [facility];  Service: Orthopedics;  Laterality: Left;  . I&D EXTREMITY Right 03/18/2017   Procedure: IRRIGATION AND DEBRIDEMENT EXTREMITY;  Surgeon: [name], MD;  Location: [facility];  Service: Orthopedics;  Laterality: Right;  . MINOR IRRIGATION AND DEBRIDEMENT OF WOUND Right 04/22/2014   Procedure: IRRIGATION AND DEBRIDEMENT OF RIGHT NECK ABCESS;  Surgeon: Jerrell Belfast, MD;  Location: [facility];  Service: ENT;  Laterality: Right;  . MULTIPLE EXTRACTIONS WITH ALVEOLOPLASTY N/A 01/18/2013   Procedure: MULTIPLE EXTRACION 3, 6, 7, 10, 11,  13, 21, 22, 27, 28, 29, 30 WITH ALVEOLOPLASTY;  Surgeon: Gae Bon, DDS;  Location: [facility];  Service: Oral Surgery;  Laterality: N/A;  . SKIN SPLIT GRAFT Right 03/21/2017   Procedure: IRRIGATION AND DEBRIDEMENT RIGHT FOOT AND APPLY SPLIT THICKNESS SKIN GRAFT AND WOUND VAC;  Surgeon: [name], MD;  Location: [facility];  Service: Orthopedics;  Laterality: Right;  . TEE WITHOUT CARDIOVERSION N/A 05/02/2017   Procedure: TRANSESOPHAGEAL ECHOCARDIOGRAM (TEE);  Surgeon: Acie Fredrickson Wonda Cheng, MD;  Location: [facility] OR;  Service:  Cardiovascular;  Laterality: N/A;  coincidental to orthopedic case        Home Medications    Prior to Admission medications   Medication Sig Start Date End Date Taking? Authorizing Provider  abacavir-dolutegravir-lamiVUDine (TRIUMEQ) 600-50-300 MG tablet Take 1 tablet by mouth daily. 01/20/17   [name], MD  acetaminophen (TYLENOL) 500 MG tablet Take 1,000 mg by mouth every 6 (six) hours as needed (for pain).    [provider]  amLODipine (NORVASC) 10 MG tablet Take 1 tablet (10 mg total) by mouth daily. Patient not taking: Reported on 01/06/2018 01/17/17   Nita Sells, MD  azithromycin (ZITHROMAX) 250 MG tablet Take 1 tablet (250 mg total) by mouth daily. Take first 2 tablets together, then 1 every day until finished. 01/26/18   Caraline Deutschman, Dellis Filbert, PA-C  cyclobenzaprine (FLEXERIL) 10 MG tablet Take 1 tablet (10 mg total) by mouth 2 (two) times daily as needed for muscle spasms. Patient not taking: Reported on 01/06/2018 10/25/16   Ashley Murrain, NP  darunavir-cobicistat (PREZCOBIX) 800-150 MG tablet Take 1 tablet by mouth daily. Swallow whole. Do NOT crush, break or chew tablets. Take with food. 01/20/17   [name], MD  escitalopram (LEXAPRO) 10 MG tablet Take 10 mg by mouth daily.    [provider]  ferrous sulfate 325 (65 FE) MG tablet Take 325 mg by mouth daily with breakfast.    [provider]  gabapentin (NEURONTIN) 300 MG capsule Take 1 capsule (300 mg total) by mouth 3 (three) times daily. Patient not taking: Reported on 01/06/2018 06/17/17   [name], MD  glimepiride (AMARYL) 1 MG tablet Take 1 tablet (1 mg total) by mouth every morning. Patient not taking: Reported on 01/06/2018 01/16/17 01/16/18  Nita Sells, MD  glucose blood (COOL BLOOD GLUCOSE TEST STRIPS) test strip Use as instructed 01/05/18   [name], FNP  Insulin Glargine (LANTUS SOLOSTAR) 100 UNIT/ML Solostar Pen Inject 20 Units into the skin daily  at 10 pm. 01/19/18   [name], FNP  Insulin Pen Needle (B-D UF III MINI PEN NEEDLES) 31G X 5 MM MISC 20 Units by Does not apply route as directed. 01/19/18   [name], FNP  Insulin Syringes, Disposable, U-100 0.3 ML MISC 1 Syringe by Does not apply route 3 (three) times daily. 01/05/18   [name], FNP  Lancets [facility] ULTRASOFT) lancets Use as instructed 01/05/18   [name], FNP  levorphanol (LEVODROMORAN) 2 MG tablet Take 2 mg by mouth 3 (three) times daily. 10/21/17   [provider]  lisinopril (PRINIVIL,ZESTRIL) 5 MG tablet TAKE ONE TABLET BY MOUTH ONCE DAILY Patient not taking: Reported on 01/06/2018 10/06/17   [name], FNP  LORazepam (ATIVAN) 1 MG tablet Take 1 tablet (1 mg total) by mouth every 6 (six) hours as needed for anxiety. 01/30/15   [name], MD  Oxycodone HCl 10 MG TABS Take 1  tablet (10 mg total) by mouth 3 (three) times daily as needed. Patient not taking: Reported on 01/06/2018 06/17/17   [name], MD  sildenafil (VIAGRA) 50 MG tablet Take 1 tablet (50 mg total) by mouth as needed for erectile dysfunction. 01/06/18 01/06/19  [name], FNP  tadalafil (ADCIRCA/CIALIS) 20 MG tablet Take 0.5-1 tablets (10-20 mg total) by mouth every other day as needed for erectile dysfunction. Patient not taking: Reported on 01/06/2018 11/05/17   [name], FNP  traMADol (ULTRAM) 50 MG tablet Take 1 tablet (50 mg total) by mouth every 8 (eight) hours as needed. Patient not taking: Reported on 01/06/2018 09/12/17   [name], FNP  XTAMPZA ER 9 MG C12A Take 1 capsule by mouth 2 (two) times daily. 10/06/17   [provider]    Family History Family History  Problem Relation Age of Onset  . Hypertension Mother   . Arthritis Father   . Hypertension Father   . Hypertension Brother   . Cancer Maternal Grandmother 50       unknown type of cancer  . Depression Paternal Grandmother     Social History Social  History   Tobacco Use  . Smoking status: Never Smoker  . Smokeless tobacco: Never Used  Substance Use Topics  . Alcohol use: No    Alcohol/week: 0.0 standard drinks  . Drug use: No     Allergies   Sulfa antibiotics   Review of Systems Review of Systems  All other systems reviewed and are negative.   Physical Exam Updated Vital Signs BP (!) 157/110 (BP Location: Right Arm) Comment: pt wont keep arm still  Pulse 87   Temp 98.2 F (36.8 C)   Resp (!) 22   SpO2 98%   Physical Exam  Constitutional: He is oriented to person, place, and time. He appears well-developed and well-nourished.  HENT:  Head: Normocephalic and atraumatic.  Eyes: Pupils are equal, round, and reactive to light. Conjunctivae are normal. Right eye exhibits no discharge. Left eye exhibits no discharge. No scleral icterus.  Neck: Normal range of motion. No JVD present. No tracheal deviation present.  Pulmonary/Chest: Effort normal. No stridor.  Minor expiratory wheeze left lower lobe  Neurological: He is alert and oriented to person, place, and time. Coordination normal.  Psychiatric: He has a normal mood and affect. His behavior is normal. Judgment and thought content normal.  Nursing note and vitals reviewed.   ED Treatments / Results  Labs (all labs ordered are listed, but only abnormal results are displayed) Labs Reviewed - No data to display  EKG None  Radiology Dg Chest 2 View  Result Date: 01/26/2018 CLINICAL DATA:  Productive cough and shortness of breath over the last 2 weeks. Hypertension and diabetes. EXAM: CHEST - 2 VIEW COMPARISON:  03/15/2017 FINDINGS: The heart is enlarged. The aorta shows tortuosity. I think there is bronchial thickening centrally and in the lower lungs but there is no consolidation, collapse or effusion. No significant bone finding. IMPRESSION: Possible bronchitis, particularly in the lower lungs. No consolidation or collapse. Electronically Signed   By: Nelson Chimes M.D.   On: 01/26/2018 14:13    Procedures Procedures (including critical care time)  Medications Ordered in ED Medications  ipratropium-albuterol (DUONEB) 0.5-2.5 (3) MG/3ML nebulizer solution 3 mL (3 mLs Nebulization Given 01/26/18 1459)     Initial Impression / Assessment and Plan / ED Course  I have reviewed the triage vital signs and the nursing notes.  Pertinent labs & imaging results that were available during my care of the patient were reviewed by me and considered in my medical decision making (see chart for details).     Labs:   Imaging: Chest 2 view  Consults:  Therapeutics: Albuterol, ipratropium  Discharge Meds: Zithromax and, albuterol  Assessment/Plan: 52 year old male presents today with upper respiratory infection.  Patient has had 3 weeks of cough, he does have wheezing on exam I do find it appropriate to start him on antibiotics, he is afebrile here.  Patient also having chronic pain he is referred to his primary care and pain management specialist for evaluation of this.  Return precautions given.  Patient verbalized understanding and agreement to today's plan had no further questions concerns.    Final Clinical Impressions(s) / ED Diagnoses   Final diagnoses:  Upper respiratory tract infection, unspecified type  Other chronic pain    ED Discharge Orders         Ordered    azithromycin (ZITHROMAX) 250 MG tablet  Daily     01/26/18 1528           Okey Regal, PA-C 01/26/18 2206    Jola Schmidt, MD 01/26/18 2308

## 2018-01-26 NOTE — ED Triage Notes (Signed)
Pt states yellow productive cough for 2 weeks. Pt also is out of his medication for his pain that he has to his right AKA and needs a refill.

## 2018-01-26 NOTE — Discharge Instructions (Signed)
Please read attached information. If you experience any new or worsening signs or symptoms please return to the emergency room for evaluation. Please follow-up with your primary care provider or specialist as discussed. Please use medication prescribed only as directed and discontinue taking if you have any concerning signs or symptoms.   °

## 2018-02-08 IMAGING — DX DG FOOT COMPLETE 3+V*R*
3 series · 3 of 3 positions shown · non-contrast
Comparison: Right foot MRI dated 01/07/2017

CLINICAL DATA: 51-year-old male with open wound on the lateral side
of the right foot at the base of the fifth metatarsal. History of
diabetes.

EXAM:
RIGHT FOOT COMPLETE - 3+ VIEW

[foot ap]
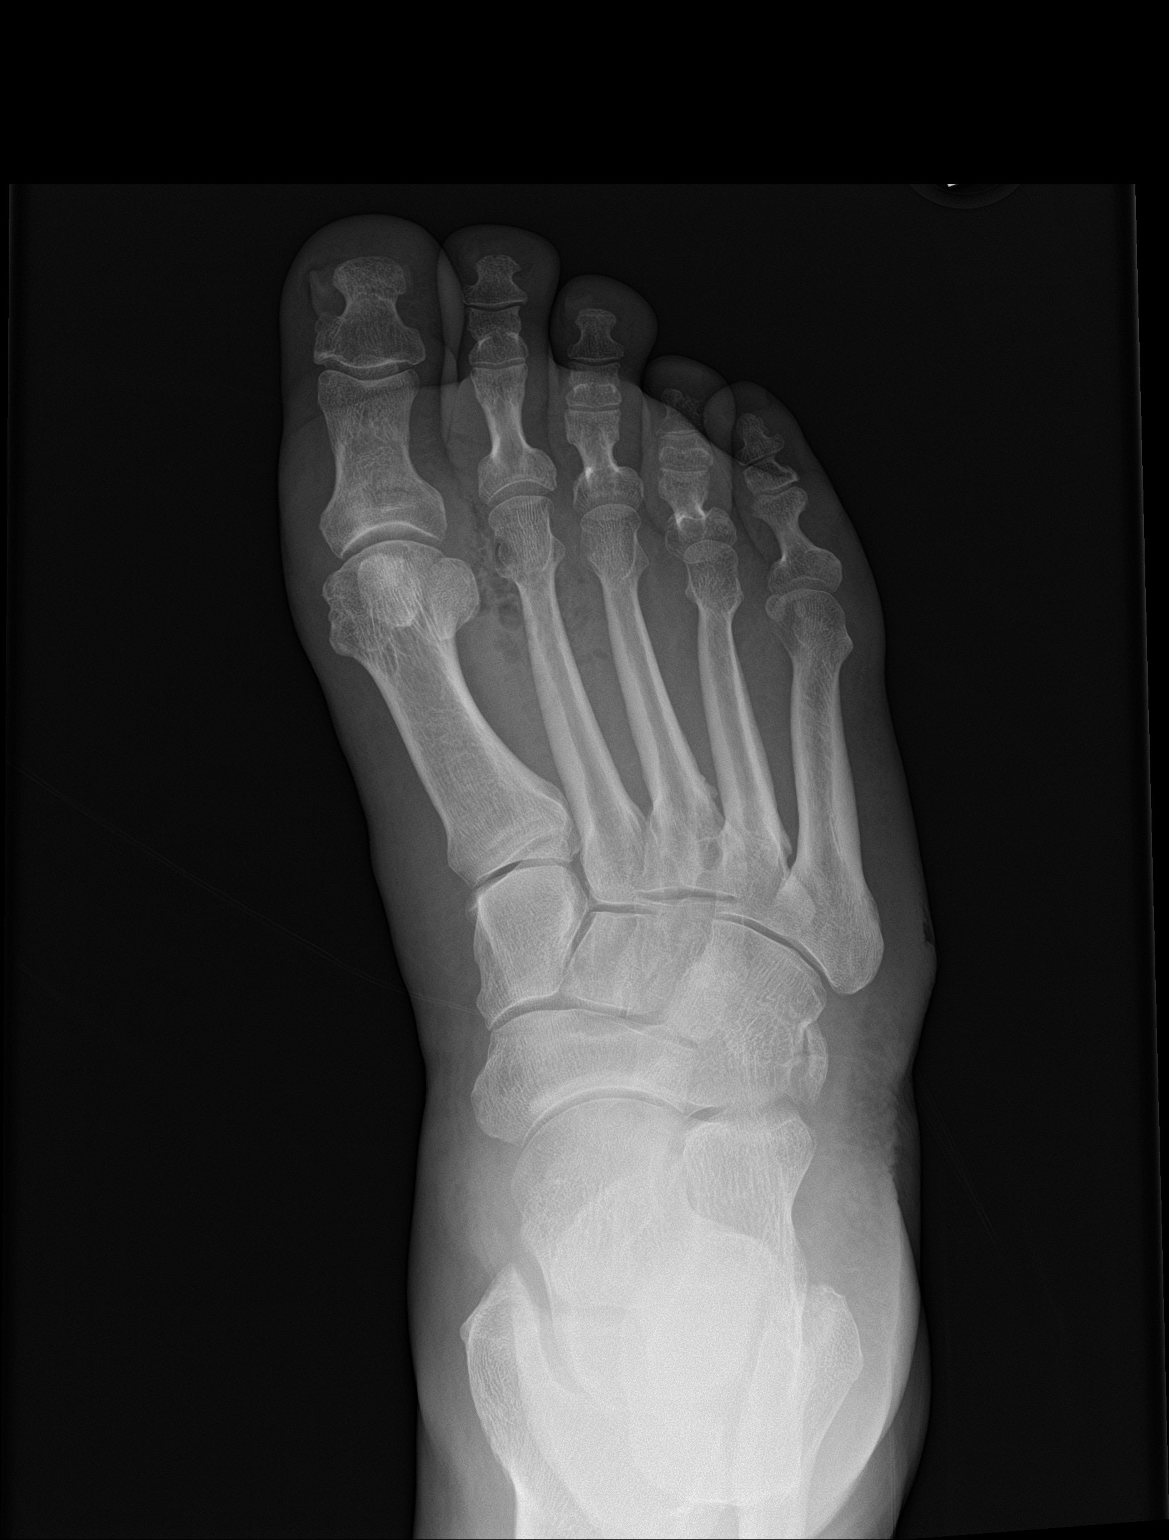

[foot obl]
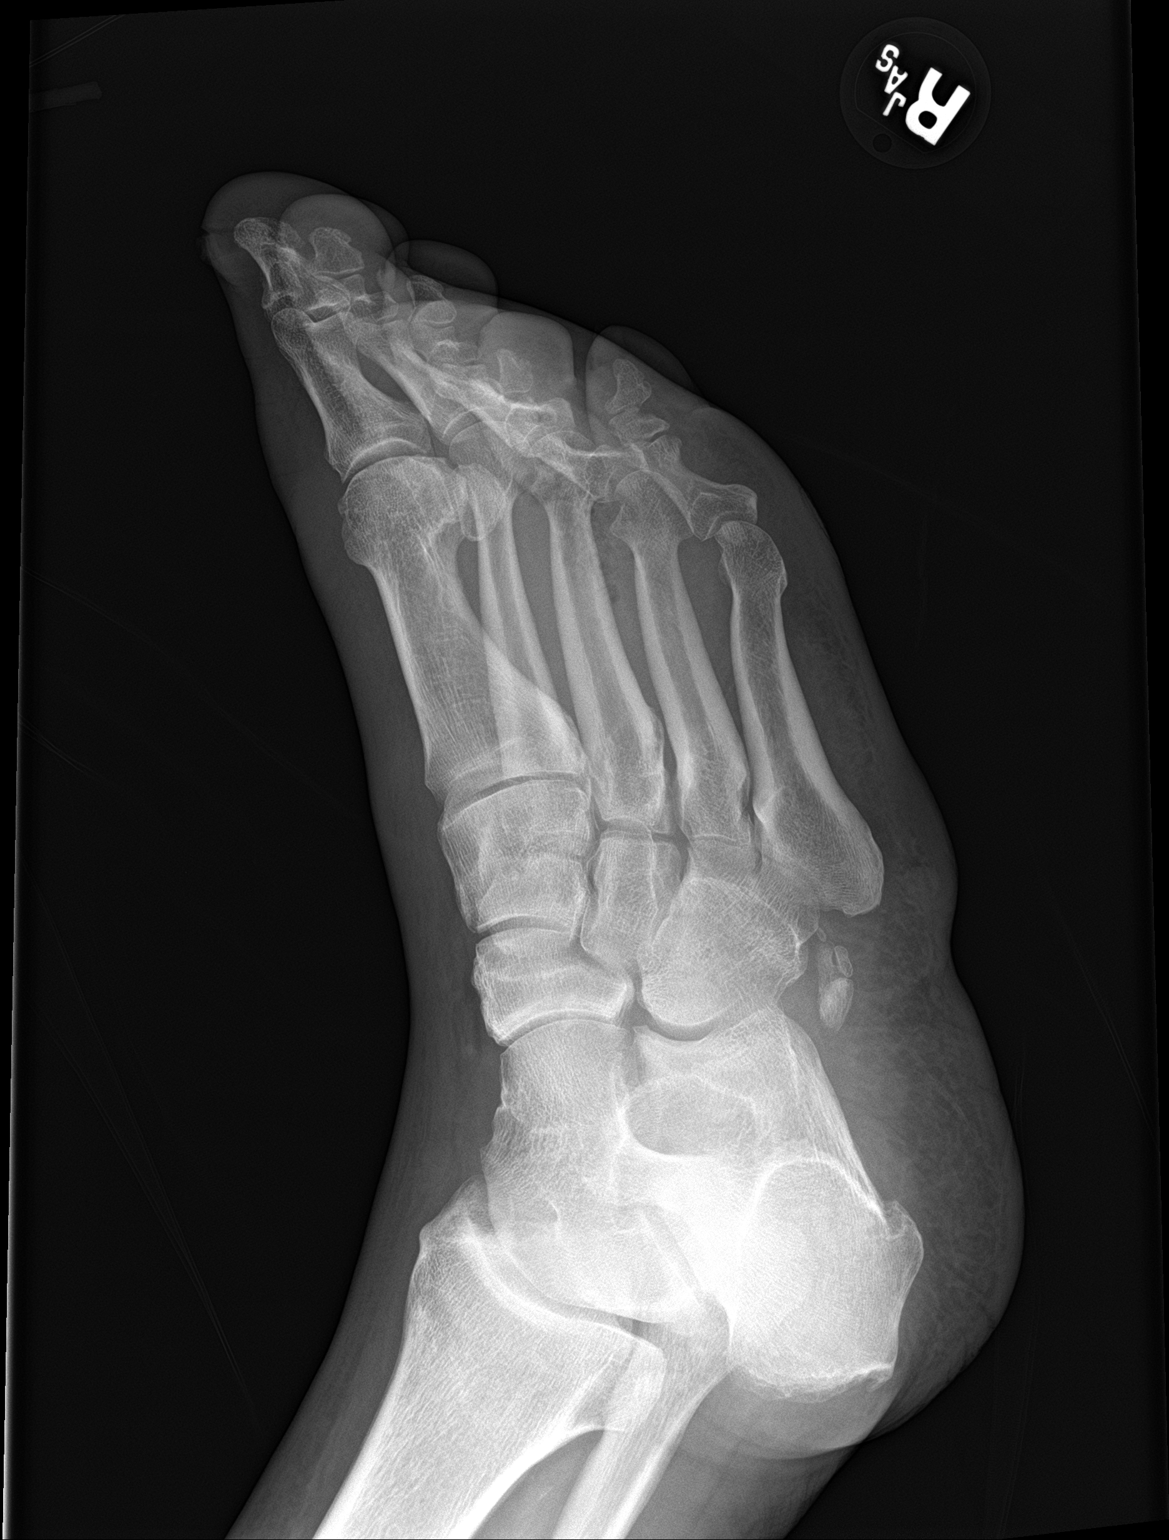

[foot lat]
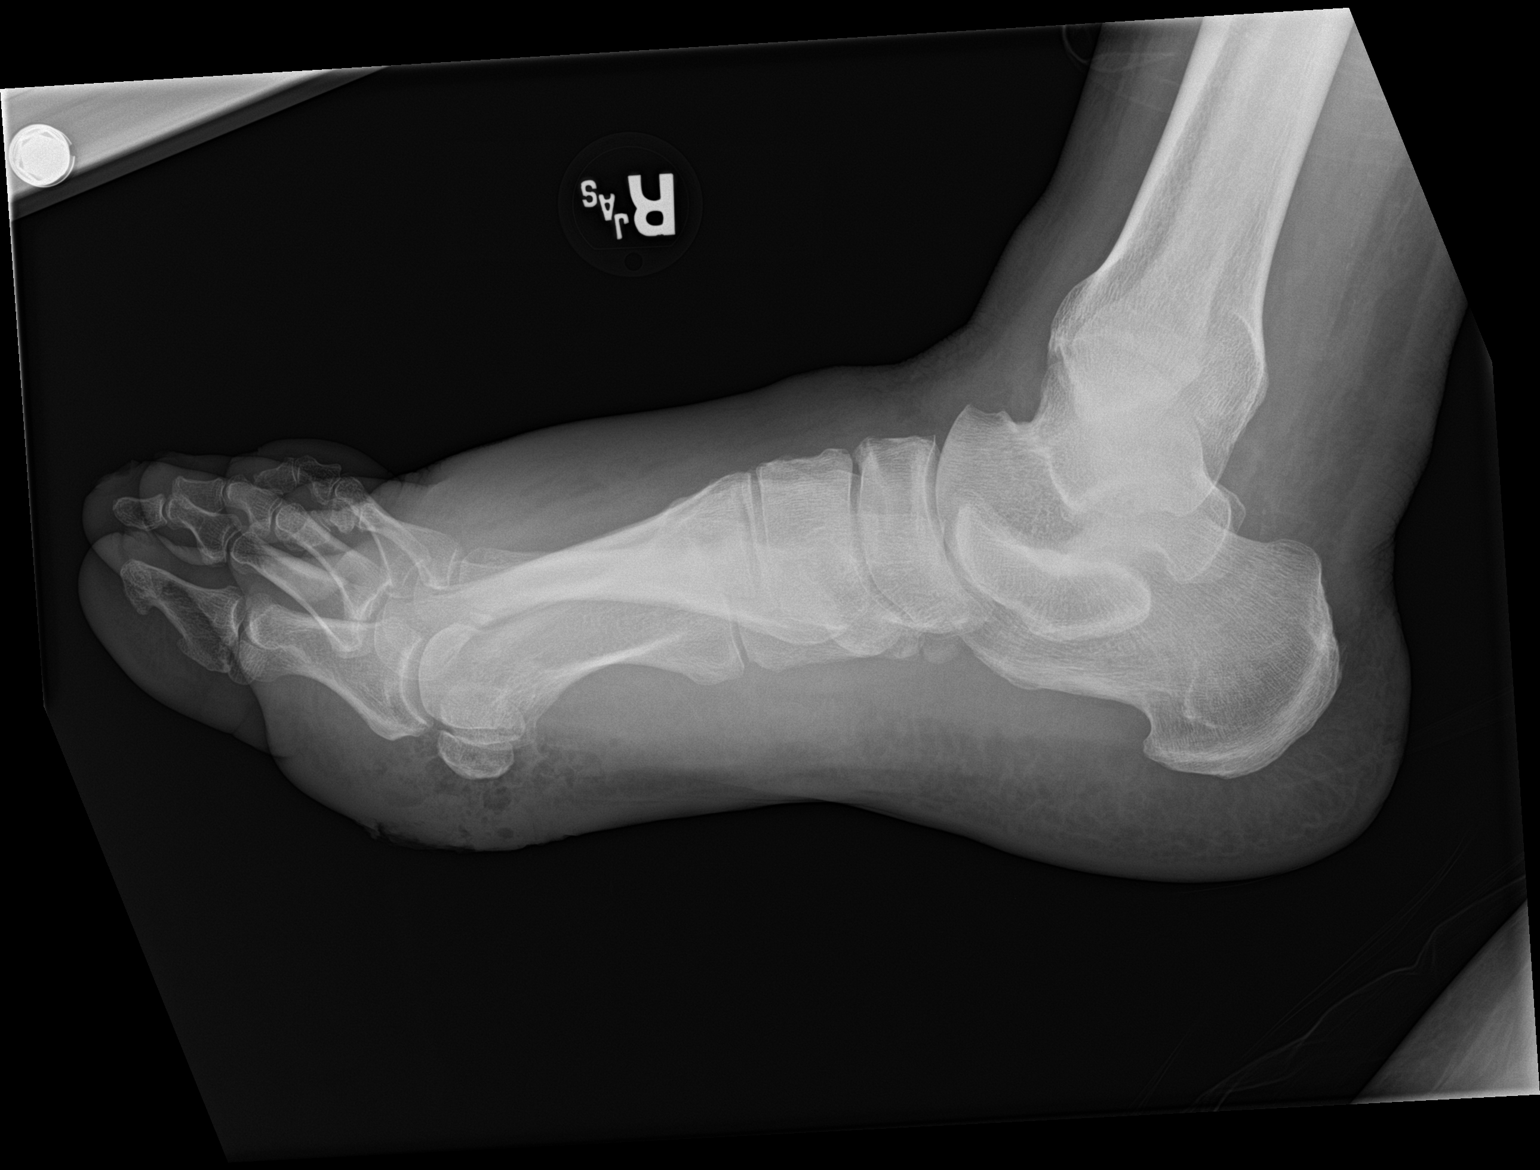

[3 of 3 positions shown; findings below may reference images not displayed]

FINDINGS: There is no acute fracture or dislocation. The bones are well
mineralized. Mild arthritic changes. No periosteal reaction or bone
erosion. There is skin ulcer with soft tissue air in the plantar
aspect of the forefoot at the level of the MTP joints. A second skin
ulcer noted along the lateral aspect of the foot at the base of the
fifth metatarsal. No soft tissue air noted adjacent to the this
ulcer. There is diffuse soft tissue swelling of the foot.
IMPRESSION: 1. No acute fracture or dislocation. No evidence of osteomyelitis by
radiograph. MRI or a white blood cell nuclear scan may provide
better evaluation if there is high clinical concern for acute
osteomyelitis.
2. Diffuse soft tissue swelling of the foot with ulcers and soft
tissue air as described most consistent with cellulitis.

## 2018-02-09 IMAGING — MR MR FOOT*R* W/O CM
4 of 5 series · 19 of 40 positions shown · non-contrast
Comparison: Plain films right foot 01/07/2017 scratch the MRI right
foot 01/07/2017. Plain films right foot 01/06/2017 and earlier
today.

CLINICAL DATA: Right foot pain and swelling over the past few days
in a diabetic patient. History of a skin ulceration on the foot for
over a month.

EXAM:
MRI OF THE RIGHT FOREFOOT WITHOUT CONTRAST
TECHNIQUE: Multiplanar, multisequence MR imaging of the right forefoot was
performed. No intravenous contrast was administered.

[Series 4: T1 · axial · 4.0mm · 0.33mm/px · z∈[-59,+24]mm · 6 of 22 slices shown (1 of 2)]
[im 1/22]
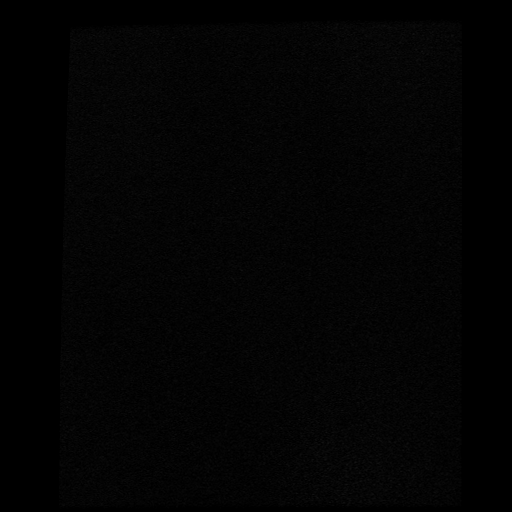
[im 4/22]
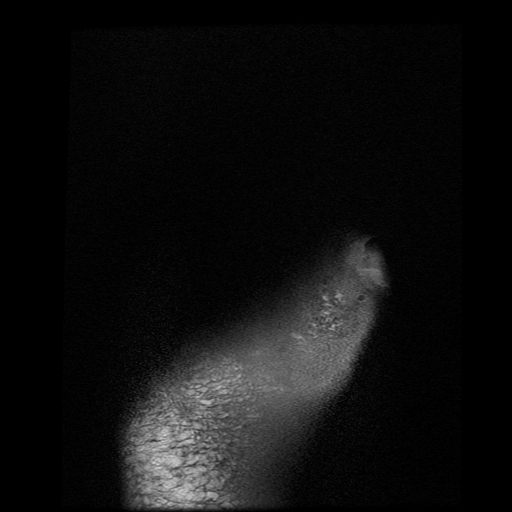
[im 8/22]
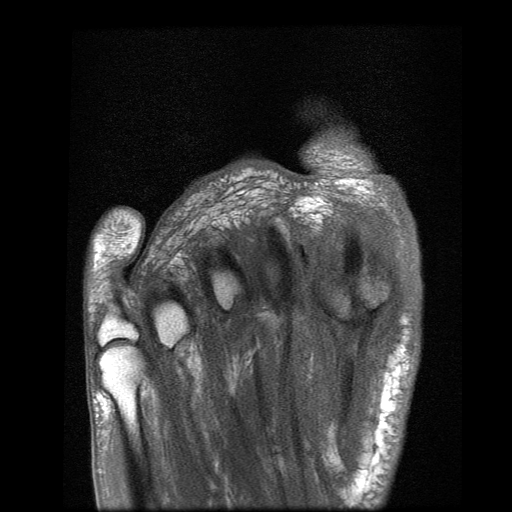
[im 11/22]
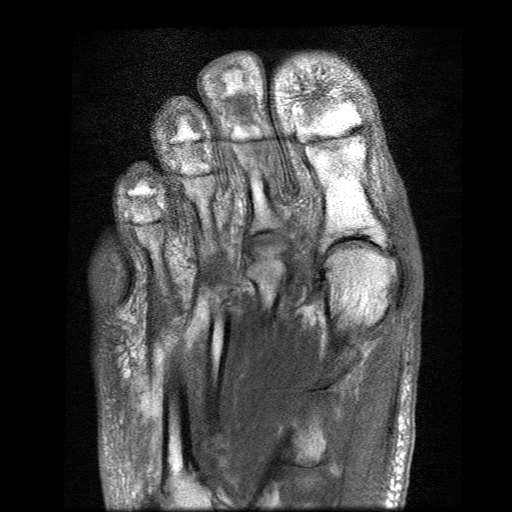
[im 15/22]
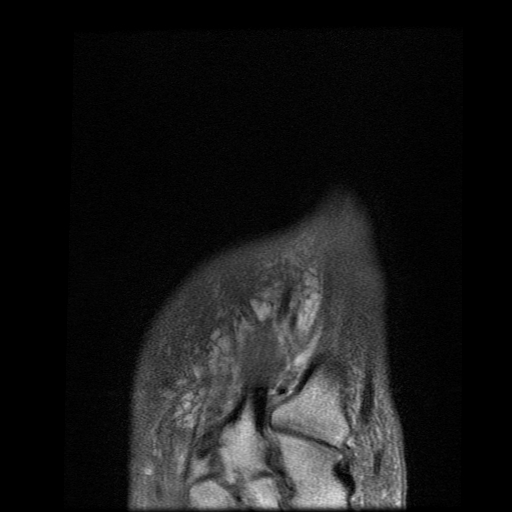
[im 18/22]
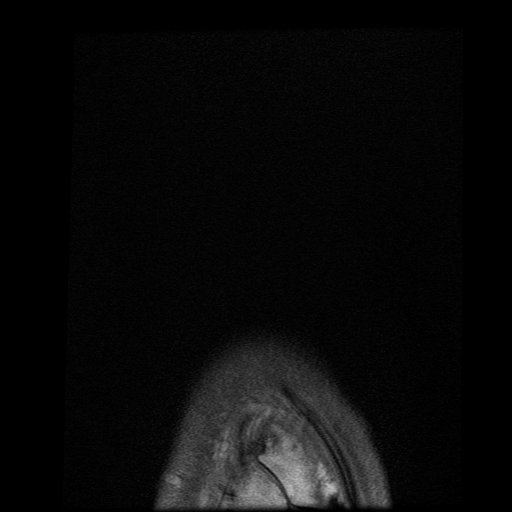

[Series 5: STIR · sagittal · 4.0mm · 0.33mm/px · 3 of 27 slices shown]
[im 4/27]
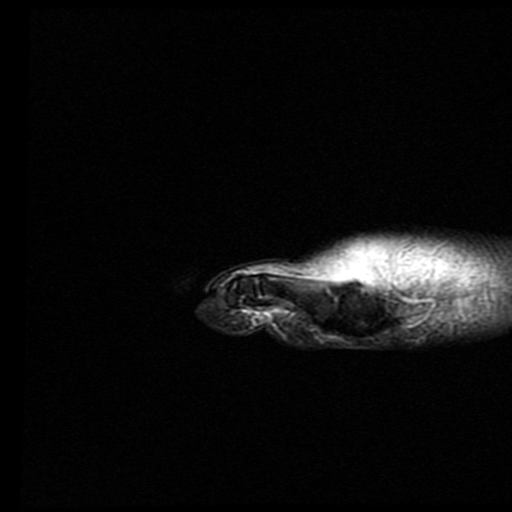
[im 15/27]
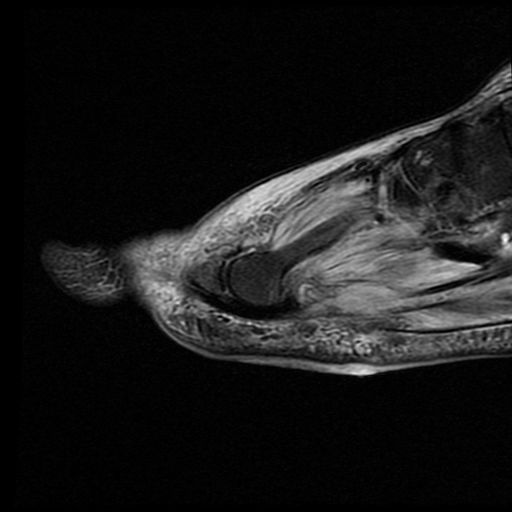
[im 23/27]
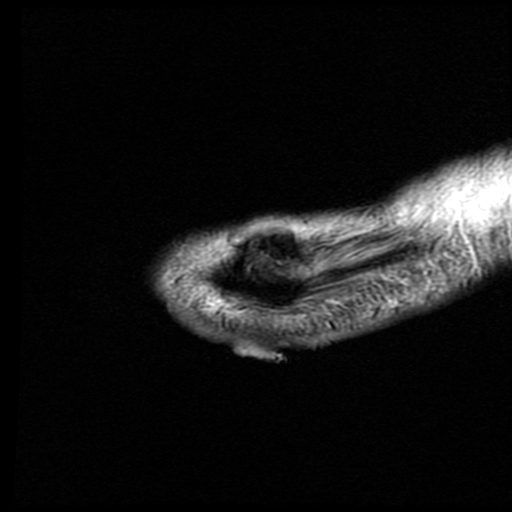

[Series 6: T2 fat-sat · axial · 4.0mm · 0.33mm/px · z∈[-59,+44]mm · 7 of 22 slices shown]
[im 1/22]
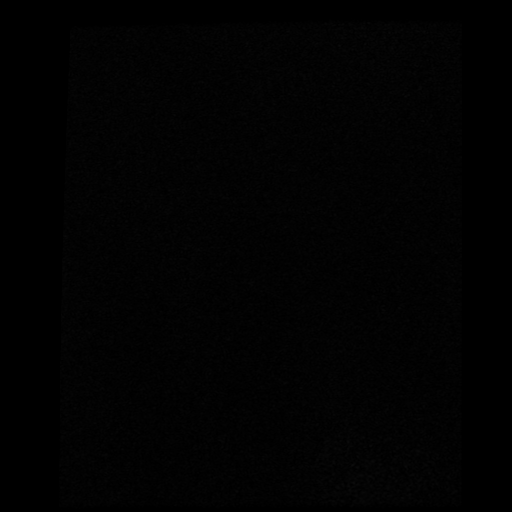
[im 4/22]
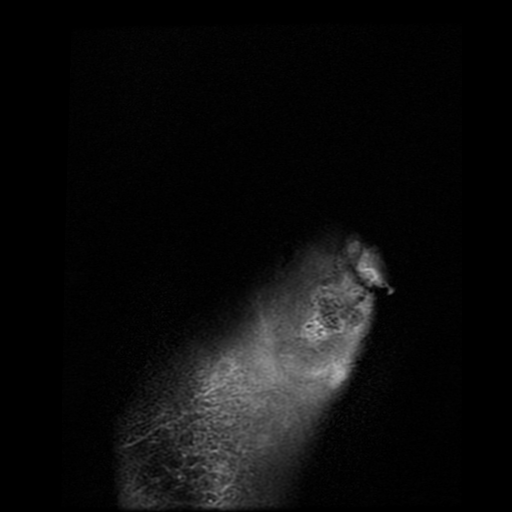
[im 8/22]
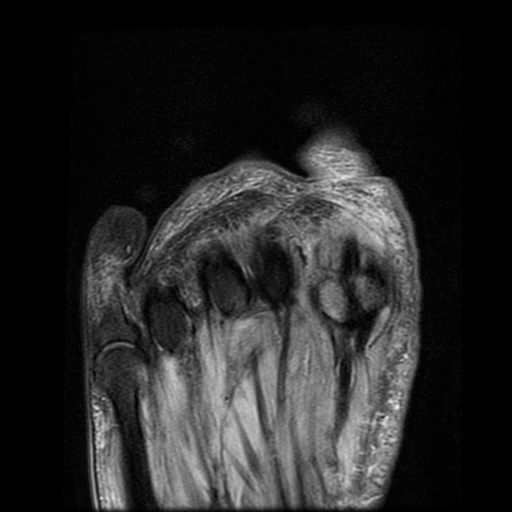
[im 11/22]
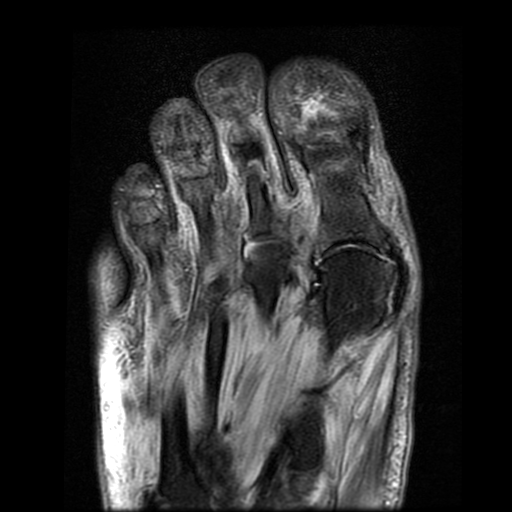
[im 15/22]
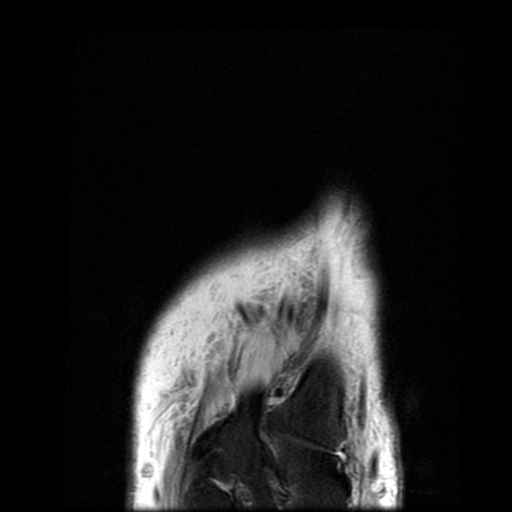
[im 18/22]
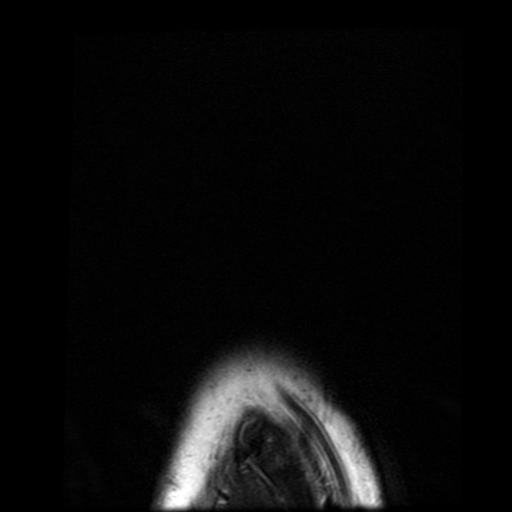
[im 22/22]
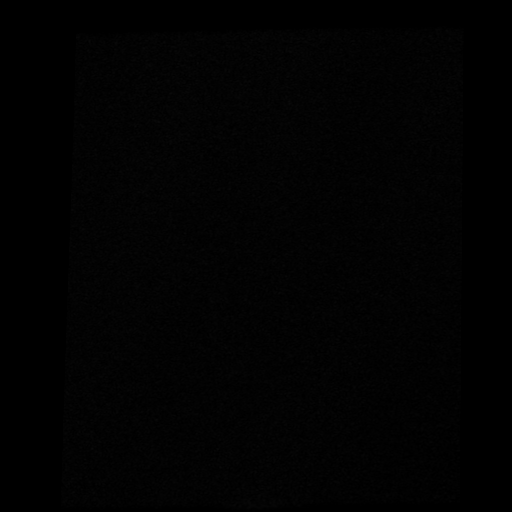

[Series 8: T1 · coronal · 4.0mm · 0.33mm/px · 3 of 32 slices shown (2 of 2)]
[im 4/32]
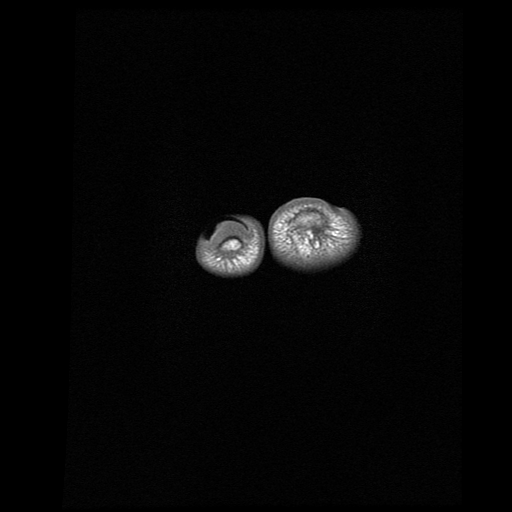
[im 16/32]
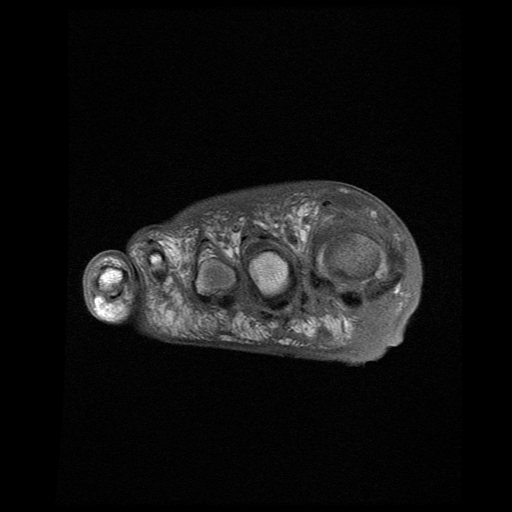
[im 28/32]
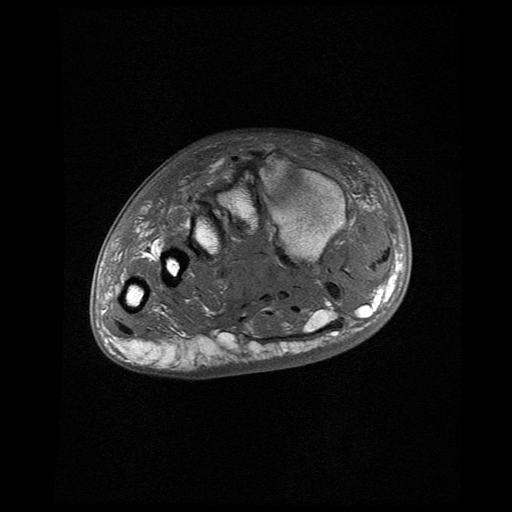

[19 of 40 positions shown; findings below may reference images not displayed]

FINDINGS: Bones/Joint/Cartilage

Marrow edema in the sesamoid bones of the first MTP joint is new
since the prior MRI and much more intense in the lateral sesamoid.
Degenerative subchondral cyst at the third tarsometatarsal joint in
the base the metatarsal is noted. Bone marrow signal is otherwise
unremarkable.

Ligaments

Intact.

Muscles and Tendons

Edema throughout all intrinsic musculature of the foot is unchanged.

Soft tissues

Intense subcutaneous edema is present over the dorsum of the foot.
An air and fluid collection centered just proximal to the lateral
sesamoid measures approximately 1.2 cm transverse by 1 cm
craniocaudal by 2.8 cm long and worrisome for abscess. This is new
since the prior examination. Skin ulceration at the level of the
head of the first metatarsal is again seen. There appears to be
blistering of the skin more proximally and laterally to the skin
ulceration.
IMPRESSION: New marrow edema in the medial and lateral sesamoids of the first
MTP joint is more intense in the lateral sesamoid bone and
consistent with osteomyelitis.

Air and fluid collection in the plantar soft tissues centered just
proximal to the lateral sesamoid of the first MTP joint is
compatible with an abscess.

Skin ulceration at the level of the head of the first metatarsal is
identified. There appears to be a blister on the skin surface more
proximally and laterally.

## 2018-02-09 IMAGING — DX DG CHEST 1V PORT
1 series · 1 of 1 positions shown · non-contrast
Comparison: 03/29/2016

CLINICAL DATA: Cough for 2 weeks

EXAM:
PORTABLE CHEST 1 VIEW

[chest ap]
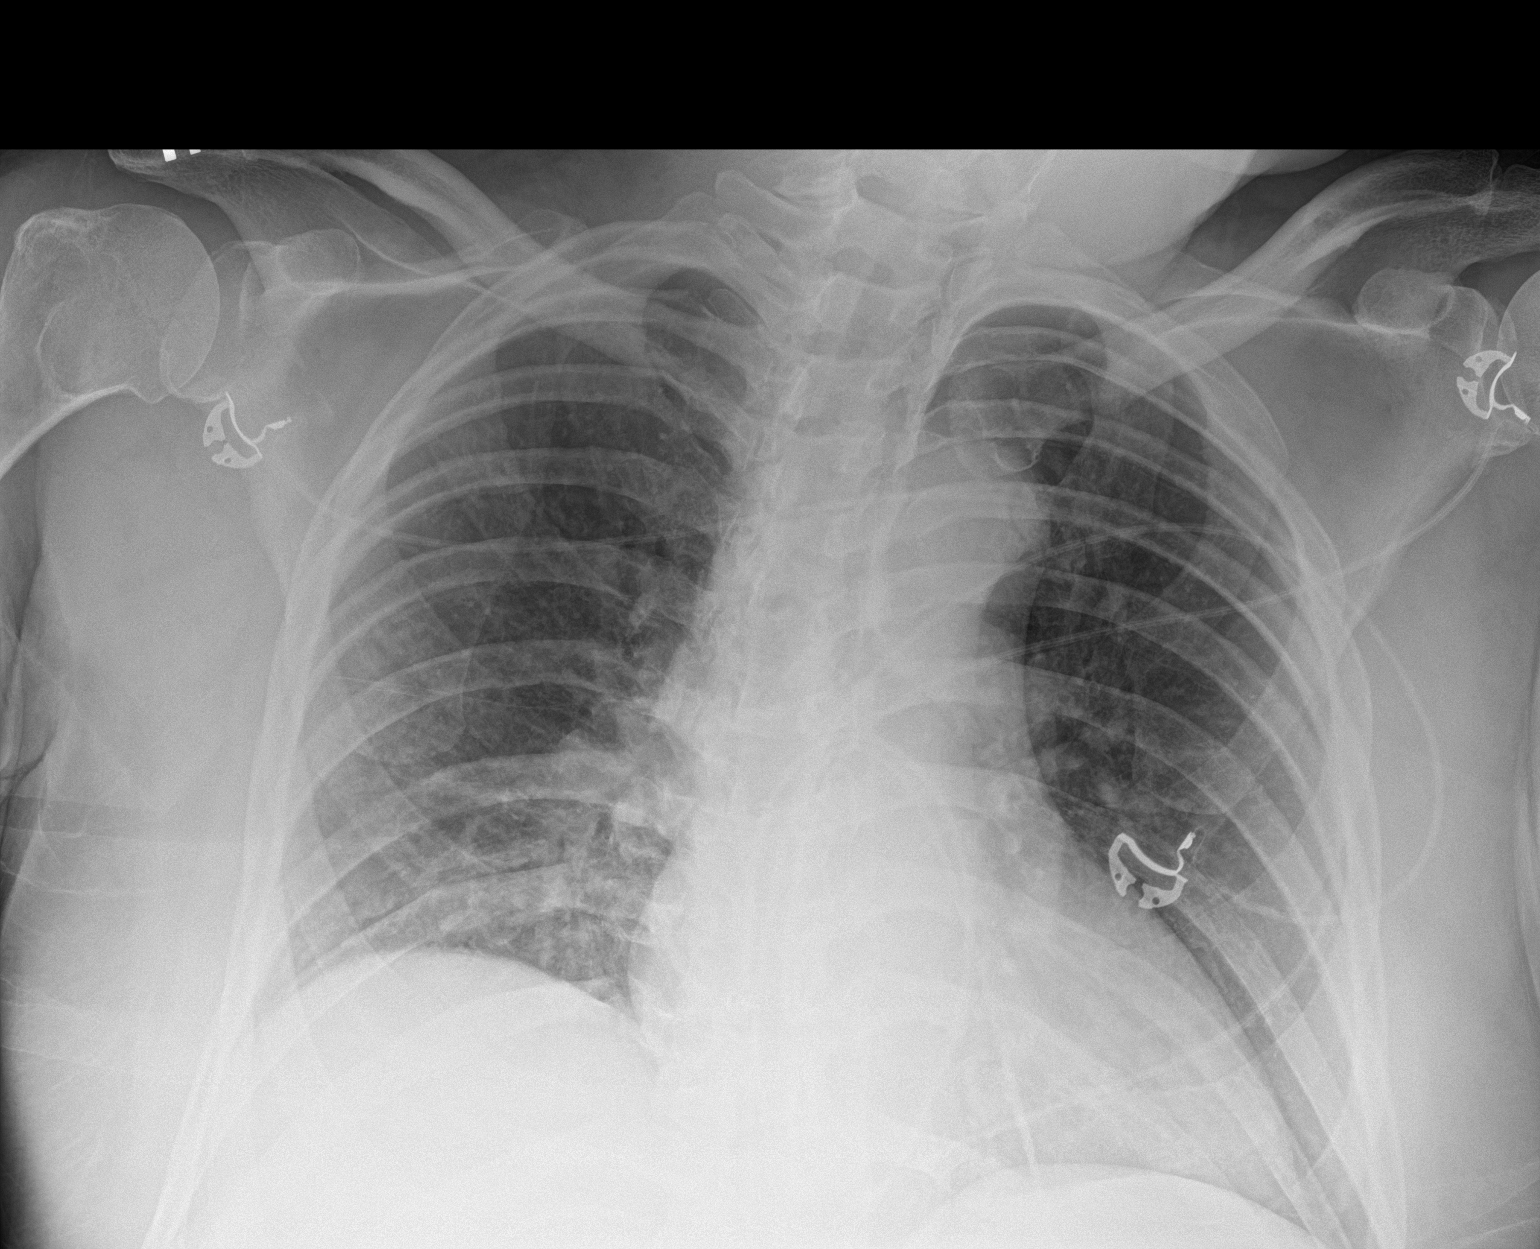

[1 of 1 positions shown; findings below may reference images not displayed]

FINDINGS: Cardiac shadow is within normal limits. The lungs are well aerated
bilaterally. Mild right basilar atelectasis is noted. No acute bony
abnormality is seen.
IMPRESSION: Mild right basilar atelectasis.

## 2018-02-11 ENCOUNTER — Telehealth (HOSPITAL_COMMUNITY): Payer: Self-pay

## 2018-02-11 DIAGNOSIS — M79604 Pain in right leg: Secondary | ICD-10-CM | POA: Diagnosis not present

## 2018-02-11 DIAGNOSIS — Z5181 Encounter for therapeutic drug level monitoring: Secondary | ICD-10-CM | POA: Diagnosis not present

## 2018-02-11 DIAGNOSIS — G546 Phantom limb syndrome with pain: Secondary | ICD-10-CM | POA: Diagnosis not present

## 2018-02-11 DIAGNOSIS — Z79899 Other long term (current) drug therapy: Secondary | ICD-10-CM | POA: Diagnosis not present

## 2018-02-11 NOTE — Telephone Encounter (Signed)
Please call regarding referral to pain clinic

## 2018-02-11 NOTE — Telephone Encounter (Signed)
Referral was sent to [facility] on 02/11/2018. [phone]

## 2018-02-13 NOTE — Telephone Encounter (Signed)
Patient has been contacted by [facility] and will get the necessary document to them.

## 2018-02-17 ENCOUNTER — Encounter: Payer: Medicare Other | Admitting: Infectious Diseases

## 2018-02-19 ENCOUNTER — Ambulatory Visit (INDEPENDENT_AMBULATORY_CARE_PROVIDER_SITE_OTHER): Payer: Medicare Other | Admitting: Physician Assistant

## 2018-02-19 ENCOUNTER — Encounter (INDEPENDENT_AMBULATORY_CARE_PROVIDER_SITE_OTHER): Payer: Self-pay | Admitting: Physician Assistant

## 2018-02-19 VITALS — Ht 69.0 in | Wt 280.0 lb

## 2018-02-19 DIAGNOSIS — I872 Venous insufficiency (chronic) (peripheral): Secondary | ICD-10-CM

## 2018-02-19 DIAGNOSIS — E1169 Type 2 diabetes mellitus with other specified complication: Secondary | ICD-10-CM | POA: Diagnosis not present

## 2018-02-19 DIAGNOSIS — E11628 Type 2 diabetes mellitus with other skin complications: Secondary | ICD-10-CM | POA: Diagnosis not present

## 2018-02-19 DIAGNOSIS — L84 Corns and callosities: Secondary | ICD-10-CM | POA: Diagnosis not present

## 2018-02-19 DIAGNOSIS — B351 Tinea unguium: Secondary | ICD-10-CM | POA: Diagnosis not present

## 2018-02-19 DIAGNOSIS — Z89511 Acquired absence of right leg below knee: Secondary | ICD-10-CM

## 2018-02-20 ENCOUNTER — Ambulatory Visit (INDEPENDENT_AMBULATORY_CARE_PROVIDER_SITE_OTHER): Payer: Medicare Other | Admitting: Physician Assistant

## 2018-02-20 ENCOUNTER — Encounter (INDEPENDENT_AMBULATORY_CARE_PROVIDER_SITE_OTHER): Payer: Self-pay | Admitting: Physician Assistant

## 2018-02-20 NOTE — Progress Notes (Signed)
Office Visit Note   Patient: Keith [name]           Date of Birth: January 20, 1966           MRN: [id number] Visit Date: 02/19/2018              Requested by: [name], [address], [place] [address] PCP: [name], FNP  Chief Complaint  Patient presents with  . Left Foot - Follow-up, Wound Check  . Right Leg - Follow-up, Pain      HPI: The patient is a 52 yo male who is seen for follow up following a right transtibial amputation 06/13/17. He reports he obtained a prosthesis about 2 months ago and is doing well using the prosthesis and has no issues with the right transtibial amputation site. He reports concerns over a new pressure area/callus of the left foot. He does not know how long the area has been present. He reports no drainage over the area.  He reports pain over the distal right below the knee amputation site. He has been working on getting established with a new pain management clinic and we discussed that we cannot prescribe medications for chronic pain from the clinic today.   Assessment & Plan: Visit Diagnoses:  1. Acquired absence of right leg below knee ([place])   2. Type 2 diabetes mellitus with pressure callus (HCC)   3. Venous insufficiency (chronic) (peripheral)   4. Onychomycosis of multiple toenails with type 2 diabetes mellitus ([place])     Plan: after informed consent, the left foot 1st MT plantar callus was debrided with a #10 blade knife to healthy bleeding tissue. Hemostasis managed with silver nitrate. Patient tolerated the proceed well.  Demonstrated Achilles stretching exercises to improve ankle motion and off load the left foot.   Follow-Up Instructions: Return in about 2 weeks (around 03/05/2018).   Ortho Exam  Patient is alert, oriented, no adenopathy, well-dressed, normal affect, normal respiratory effort. The right transtibial amputation is well healed without signs of breakdown. Good knee extension and flexion. Minimal  edema of residual limb.  The left foot has a callus/ulceration over the 1st MTP area without signs of infection or cellulitis. Palpable dorsalis pedis pulse. Edema of the left lower leg with pitting. No skin breakdown over the calf.   Imaging: No results found. No images are attached to the encounter.  Labs: Lab Results  Component Value Date   HGBA1C 6.4 (A) 01/06/2018   HGBA1C 6.4 (A) 11/04/2017   HGBA1C 5.4 08/27/2017   ESRSEDRATE 140 (H) 04/28/2017   ESRSEDRATE 127 (H) 03/15/2017   ESRSEDRATE 94 (H) 01/07/2017   CRP 14.9 (H) 04/28/2017   CRP 23.1 (H) 03/15/2017   CRP 1.8 (H) 01/08/2017   LABURIC 6.3 08/07/2009   REPTSTATUS 05/08/2017 FINAL 05/03/2017   GRAMSTAIN  04/29/2017    NO WBC SEEN RARE GRAM POSITIVE COCCI IN PAIRS IN SINGLES    CULT NO GROWTH 5 DAYS 05/03/2017   LABORGA METHICILLIN RESISTANT STAPHYLOCOCCUS AUREUS 04/29/2017     Lab Results  Component Value Date   ALBUMIN 3.6 08/19/2017   ALBUMIN 3.0 (L) 04/28/2017   ALBUMIN 3.0 (L) 01/15/2017   PREALBUMIN 7.4 (L) 03/15/2017   LABURIC 6.3 08/07/2009    Body mass index is 41.35 kg/m.  Orders:  No orders of the defined types were placed in this encounter.  No orders of the defined types were placed in this encounter.    Procedures: No procedures  performed  Clinical Data: No additional findings.  ROS:  All other systems negative, except as noted in the HPI. Review of Systems  Objective: Vital Signs: Ht 5\' 9"  (1.753 m)   Wt 280 lb (127 kg)   BMI 41.35 kg/m   Specialty Comments:  No specialty comments available.  PMFS History: Patient Active Problem List   Diagnosis Date Noted  . Below knee amputation status, right 06/13/2017  . S/P transmetatarsal amputation of foot, right ([place]) 05/07/2017  . Wound infection   . Bacteremia   . Fever   . Sepsis ([place]) 03/15/2017  . CKD (chronic kidney disease) stage 3, GFR 30-59 ml/min (HCC) 03/15/2017  . Diabetic polyneuropathy associated with type 2  diabetes mellitus ([place]) 01/23/2017  . Right foot ulcer, limited to breakdown of skin ([place]) 01/07/2017  . AKI (acute kidney injury) ([place]) 09/28/2016  . Nausea vomiting and diarrhea 09/28/2016  . Onychomycosis of multiple toenails with type 2 diabetes mellitus ([place]) 08/29/2015  . MRSA carrier 04/20/2014  . Testosterone deficiency 04/20/2014  . Penile wart 02/22/2014  . HIV disease ([place])   . Insulin-requiring or dependent type II diabetes mellitus ([place]) 02/04/2014  . Dental anomaly 11/20/2012  . Arthritis of right knee 02/23/2012  . Hyperlipidemia 11/10/2011  . Chronic pain 08/07/2011  . Meralgia paraesthetica 04/23/2011  . ERECTILE DYSFUNCTION 08/22/2008  . Essential hypertension 05/19/2008   Past Medical History:  Diagnosis Date  . Abscess of right foot   . AIDS ([place])   . Chronic knee pain    right  . Chronic pain   . Diabetes type 2, uncontrolled ([place])    HgA1c 17.6 (04/27/2010)  . Diabetic foot ulcer ([place]) 01/2017   right foot  . Erectile dysfunction   . Genital warts   . HIV (human immunodeficiency virus infection) ([place]) 2009   CD4 count 100, VL 13800 (05/01/2010)  . Hypertension   . Osteomyelitis ([place])    h/o hand  . Osteomyelitis of metatarsal ([place]) 04/28/2017    Family History  Problem Relation Age of Onset  . Hypertension Mother   . Arthritis Father   . Hypertension Father   . Hypertension Brother   . Cancer Maternal Grandmother 19       unknown type of cancer  . Depression Paternal Grandmother     Past Surgical History:  Procedure Laterality Date  . AMPUTATION Right 05/02/2017   Procedure: AMPUTATION TRANSMETARSAL;  Surgeon: [name], MD;  Location: [place];  Service: Orthopedics;  Laterality: Right;  . AMPUTATION Right 06/13/2017   Procedure: RIGHT BELOW KNEE AMPUTATION;  Surgeon: [name], MD;  Location: [place];  Service: Orthopedics;  Laterality: Right;  . BELOW KNEE LEG AMPUTATION Right 06/13/2017  . HERNIA REPAIR    . I&D EXTREMITY Left  08/21/2014   Procedure: INCISION AND DRAINAGE LEFT SMALL FINGER;  Surgeon: Leanora Cover, MD;  Location: [facility] [place];  Service: Orthopedics;  Laterality: Left;  . I&D EXTREMITY Right 03/18/2017   Procedure: IRRIGATION AND DEBRIDEMENT EXTREMITY;  Surgeon: [name], MD;  Location: [place];  Service: Orthopedics;  Laterality: Right;  . MINOR IRRIGATION AND DEBRIDEMENT OF WOUND Right 04/22/2014   Procedure: IRRIGATION AND DEBRIDEMENT OF RIGHT NECK ABCESS;  Surgeon: Jerrell Belfast, MD;  Location: [place] [facility];  Service: ENT;  Laterality: Right;  . MULTIPLE EXTRACTIONS WITH ALVEOLOPLASTY N/A 01/18/2013   Procedure: MULTIPLE EXTRACION 3, 6, 7, 10, 11, 13, 21, 22, 27, 28, 29, 30 WITH ALVEOLOPLASTY;  Surgeon: Gae Bon, DDS;  Location[facility];  Service: Oral Surgery;  Laterality: N/A;  . SKIN SPLIT GRAFT Right 03/21/2017   Procedure: IRRIGATION AND DEBRIDEMENT RIGHT FOOT AND APPLY SPLIT THICKNESS SKIN GRAFT AND WOUND VAC;  Surgeon: [name], MD;  Location: [facility];  Service: Orthopedics;  Laterality: Right;  . TEE WITHOUT CARDIOVERSION N/A 05/02/2017   Procedure: TRANSESOPHAGEAL ECHOCARDIOGRAM (TEE);  Surgeon: Acie Fredrickson Wonda Cheng, MD;  Location[facility] OR;  Service: Cardiovascular;  Laterality: N/A;  coincidental to orthopedic case   Social History   Occupational History  . Not on file  Tobacco Use  . Smoking status: Never Smoker  . Smokeless tobacco: Never Used  Substance and Sexual Activity  . Alcohol use: No    Alcohol/week: 0.0 standard drinks  . Drug use: No  . Sexual activity: Yes    Partners: Female    Comment: given condoms

## 2018-03-11 ENCOUNTER — Ambulatory Visit (INDEPENDENT_AMBULATORY_CARE_PROVIDER_SITE_OTHER): Payer: Medicare Other | Admitting: Orthopedic Surgery

## 2018-03-23 DIAGNOSIS — G894 Chronic pain syndrome: Secondary | ICD-10-CM | POA: Diagnosis not present

## 2018-03-25 IMAGING — CR DG FOOT COMPLETE 3+V*R*
3 series · 3 of 3 positions shown · non-contrast
Comparison: 03/14/2017

CLINICAL DATA: C/o pain to right foot. Best obtainable images due
to patients pain. Hx diabetes, htn

EXAM:
RIGHT FOOT COMPLETE - 3+ VIEW

[foot ap]
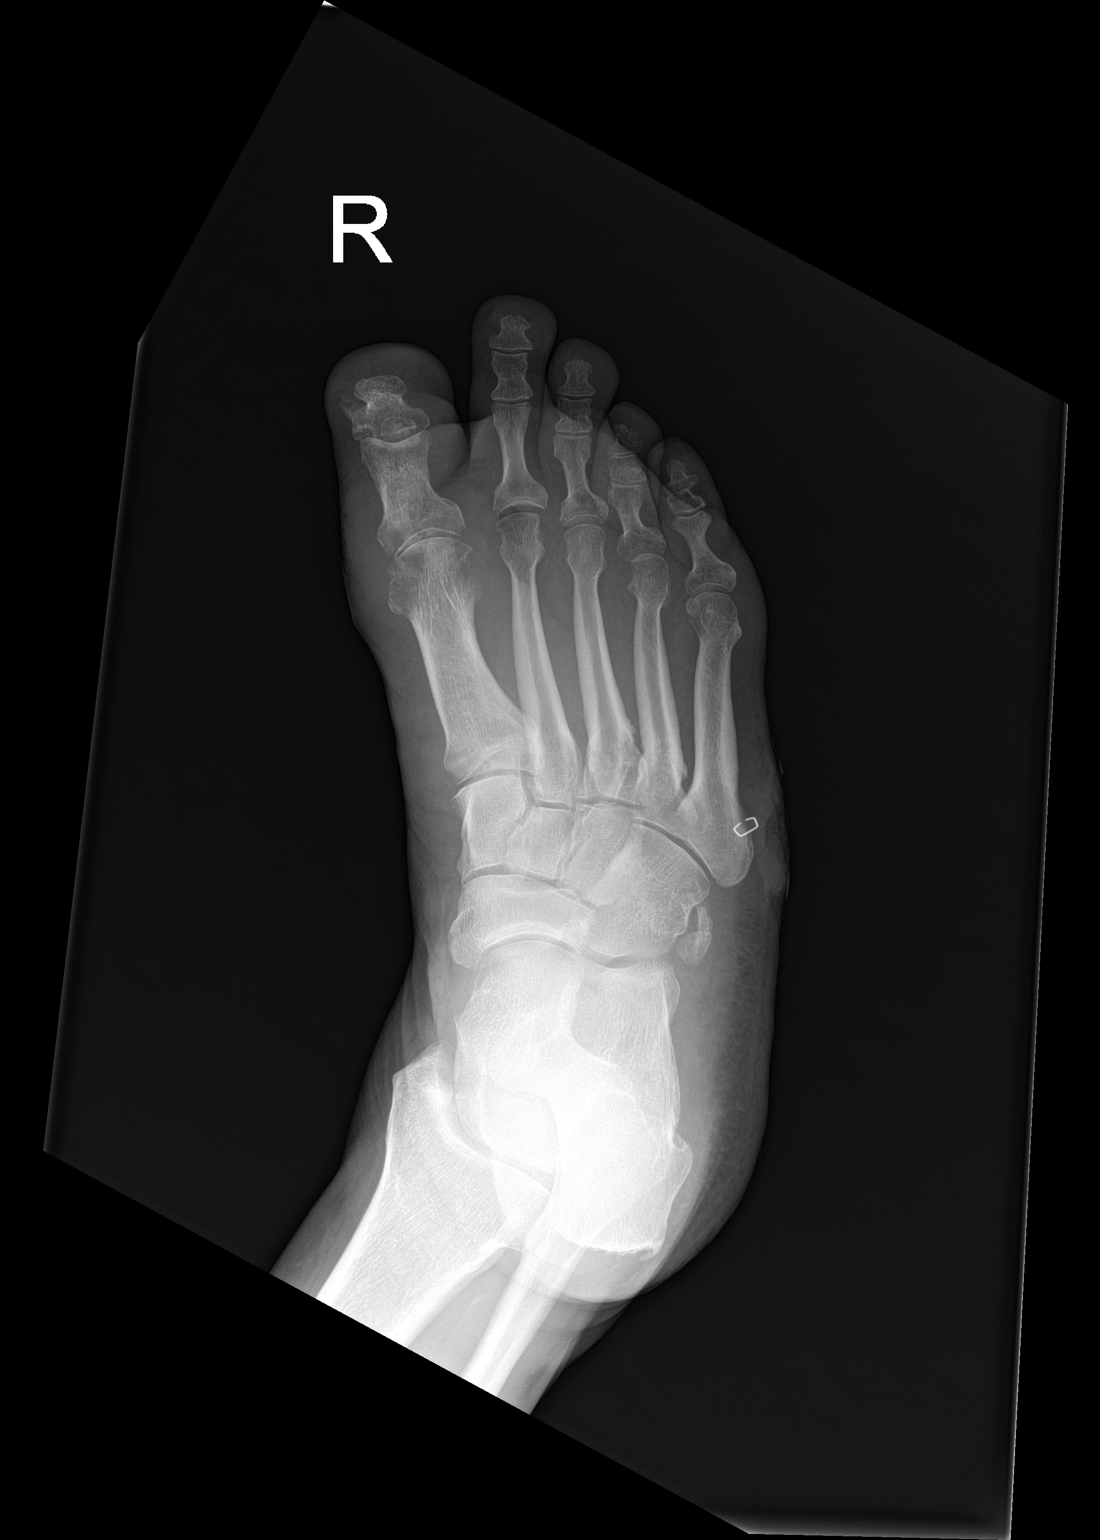

[foot obl]
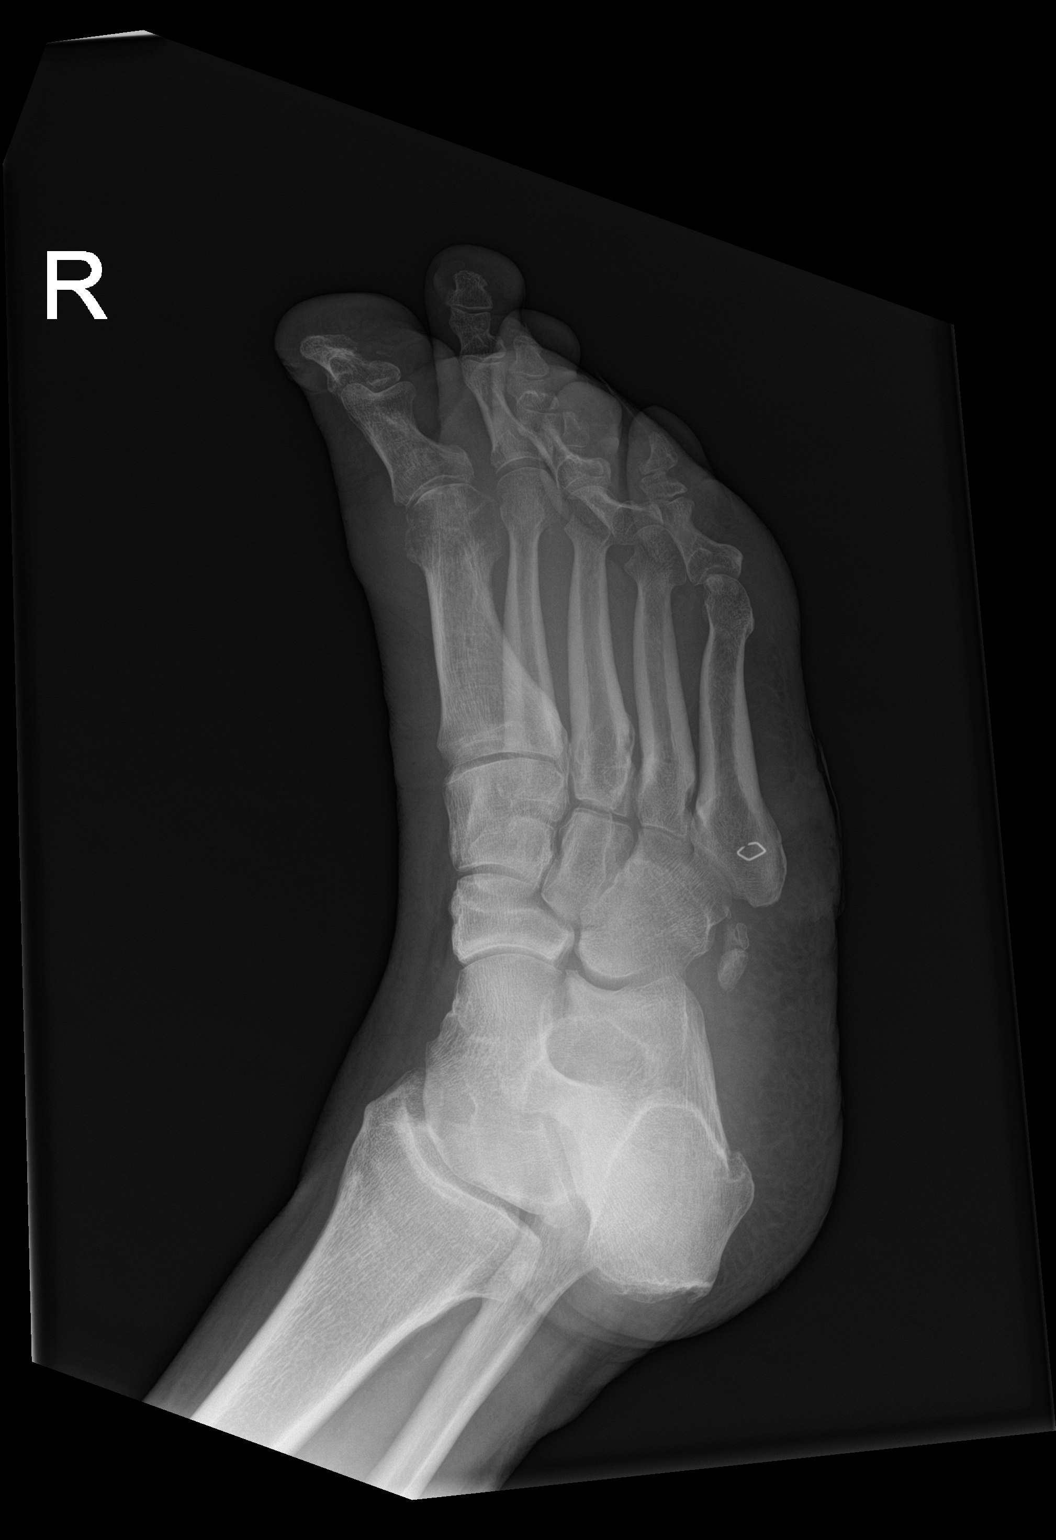

[foot lat]
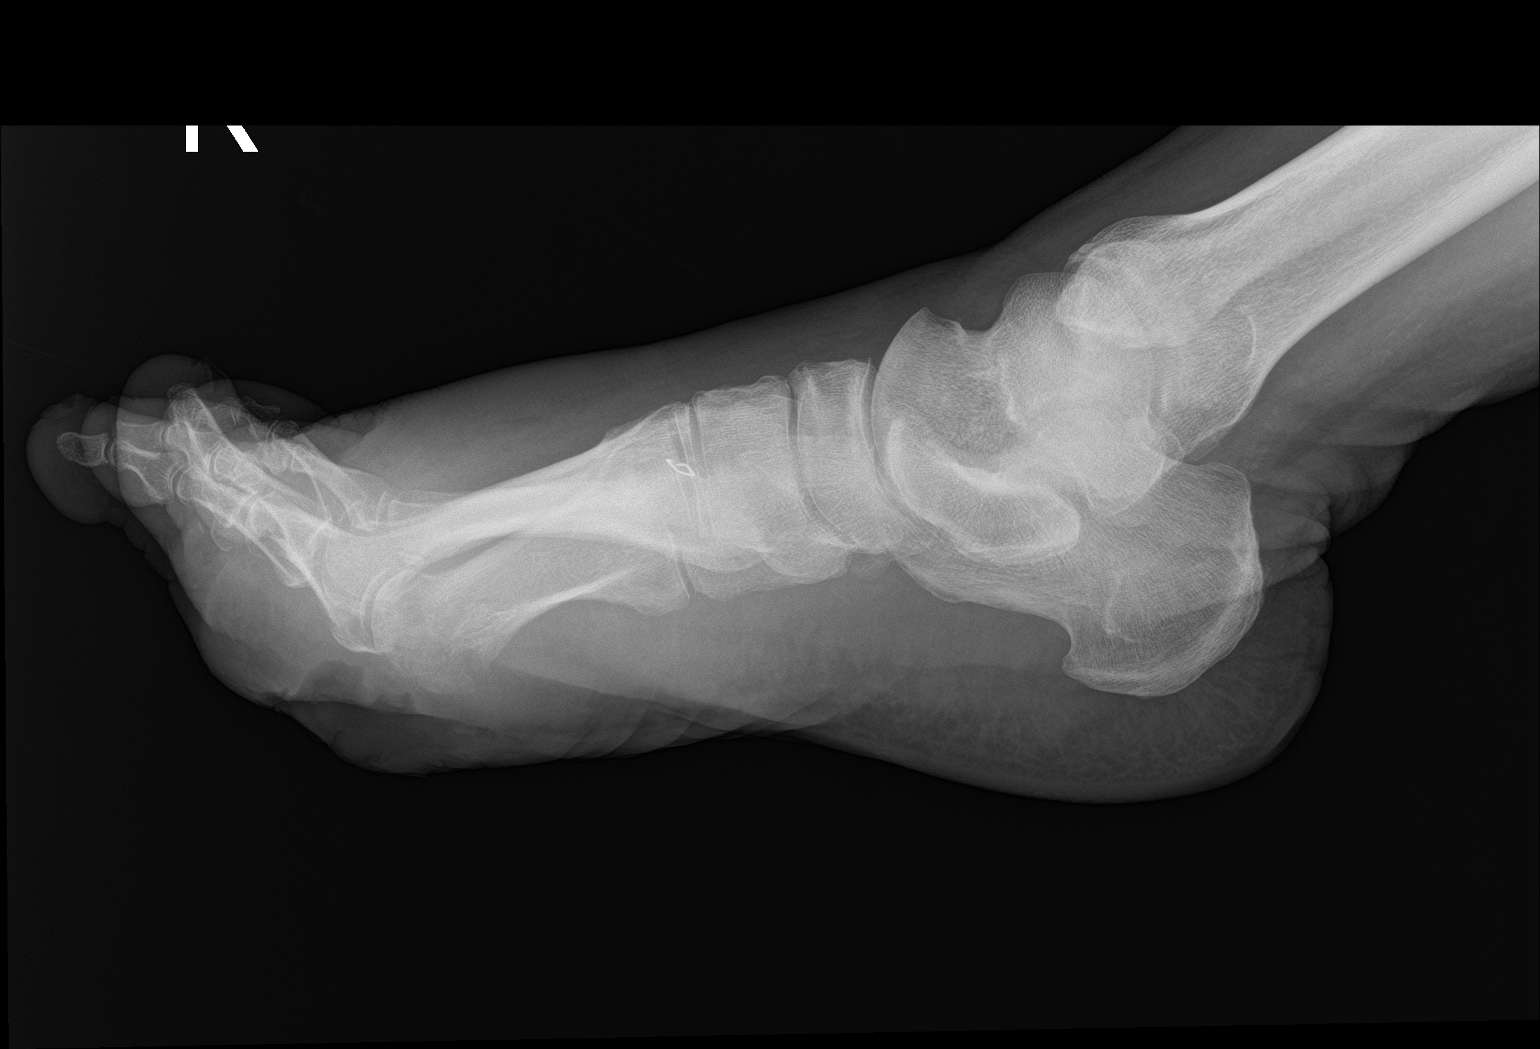

[3 of 3 positions shown; findings below may reference images not displayed]

FINDINGS: Progressive patchy demineralization in the head of the first
metatarsal. Otherwise normal mineralization. Great toe sesamoids are
no longer visible possibly surgically resected. There is an
associated plantar skin defect. Stap if le projects over the base of
the fifth metatarsal. Normal alignment.
IMPRESSION: 1. Progressive demineralization in the first metatarsal head
suggesting osteomyelitis.

## 2018-03-27 ENCOUNTER — Other Ambulatory Visit: Payer: Self-pay | Admitting: Infectious Diseases

## 2018-03-27 ENCOUNTER — Encounter (INDEPENDENT_AMBULATORY_CARE_PROVIDER_SITE_OTHER): Payer: Self-pay | Admitting: Physician Assistant

## 2018-03-27 ENCOUNTER — Ambulatory Visit (INDEPENDENT_AMBULATORY_CARE_PROVIDER_SITE_OTHER): Payer: Medicare Other | Admitting: Physician Assistant

## 2018-03-27 VITALS — Ht 69.0 in | Wt 280.0 lb

## 2018-03-27 DIAGNOSIS — B20 Human immunodeficiency virus [HIV] disease: Secondary | ICD-10-CM

## 2018-03-27 DIAGNOSIS — Z89511 Acquired absence of right leg below knee: Secondary | ICD-10-CM

## 2018-03-27 IMAGING — MR MR FOOT*R* WO/W CM
6 of 13 series · 21 of 40 positions shown · IV contrast (multihance)
Comparison: Plain films right foot 03/14/2017 and 04/28/2017. MRI
right foot 03/15/2017.

CLINICAL DATA: Diabetic patient with chronic skin ulcerations on
the foot. Question osteomyelitis.

EXAM:
MRI OF THE RIGHT FOREFOOT WITHOUT AND WITH CONTRAST
TECHNIQUE: Multiplanar, multisequence MR imaging of the right forefoot was
performed before and after the administration of intravenous
contrast.
CONTRAST:  20mL MULTIHANCE GADOBENATE DIMEGLUMINE 529 MG/ML IV SOLN

[Series 5: T1 fat-sat · coronal · non-contrast · 4.0mm · 0.27mm/px · 4 of 40 slices shown]
[im 1/40]
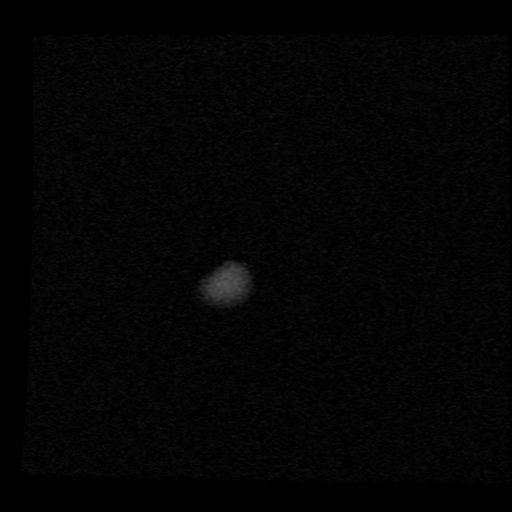
[im 14/40]
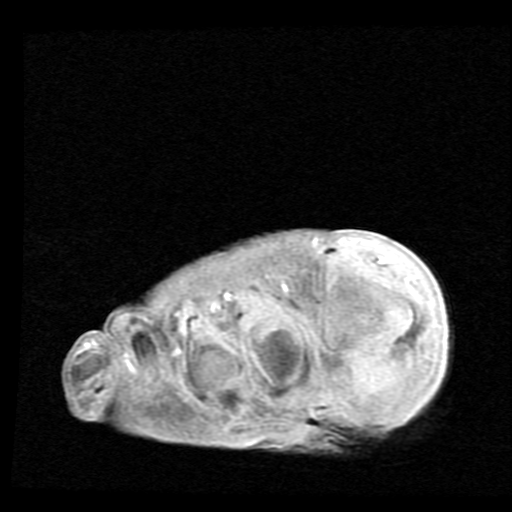
[im 27/40]
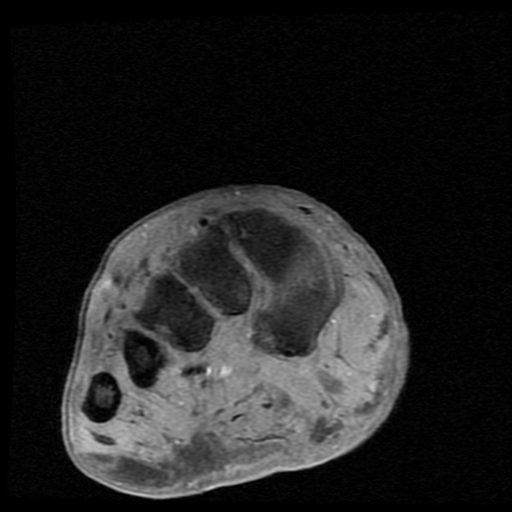
[im 40/40]
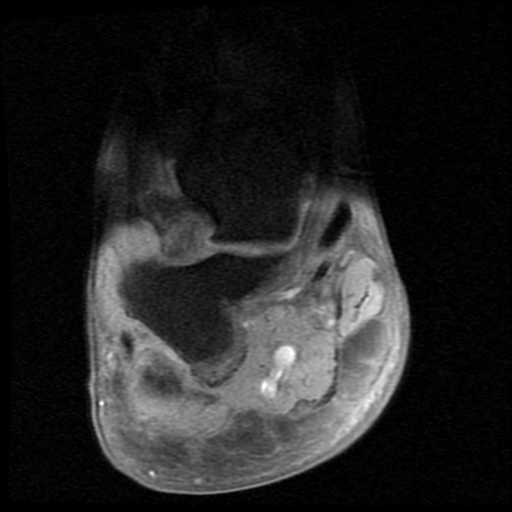

[Series 6: T1 · coronal · 4.0mm · 0.27mm/px · 4 of 40 slices shown (1 of 3)]
[im 1/40]
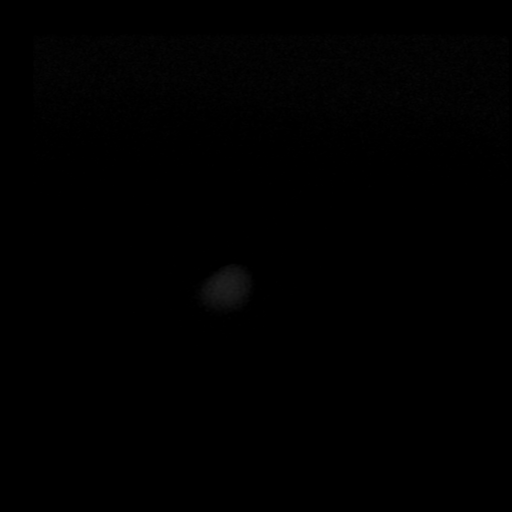
[im 14/40]
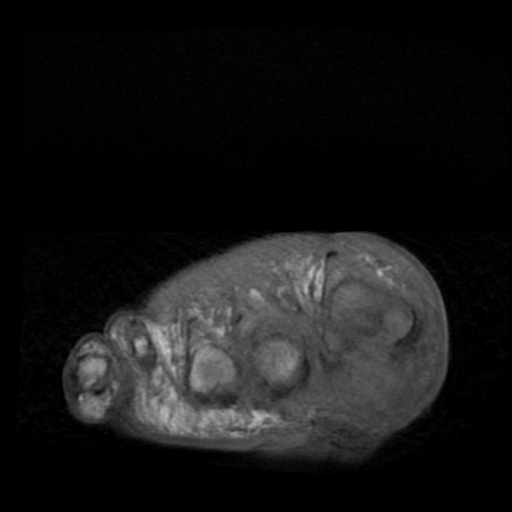
[im 27/40]
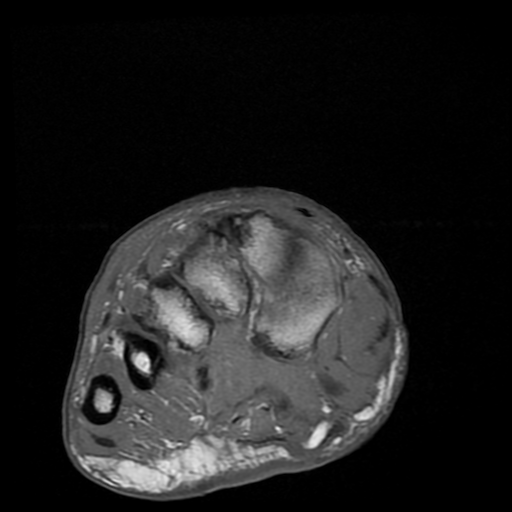
[im 40/40]
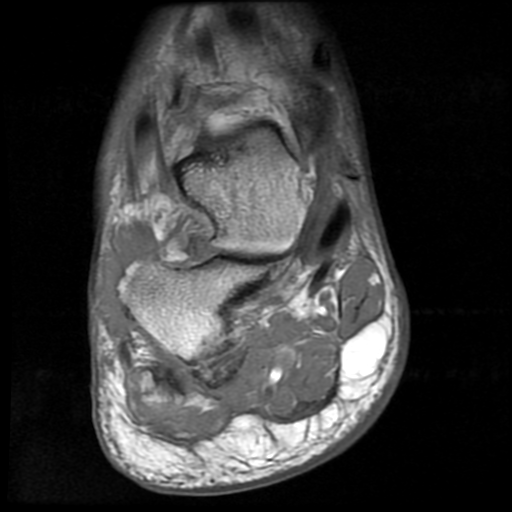

[Series 7: T2 fat-sat · coronal · 4.0mm · 0.55mm/px · 4 of 40 slices shown]
[im 1/40]
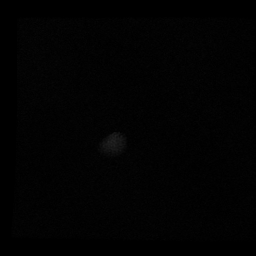
[im 14/40]
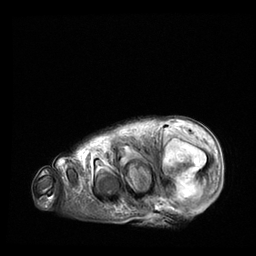
[im 27/40]
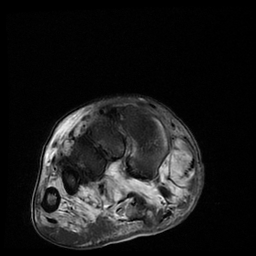
[im 40/40]
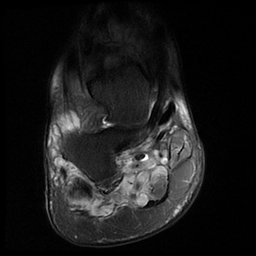

[Series 10: T1 · oblique · 3.0mm · 0.35mm/px · 3 of 25 slices shown (2 of 3)]
[im 1/25]
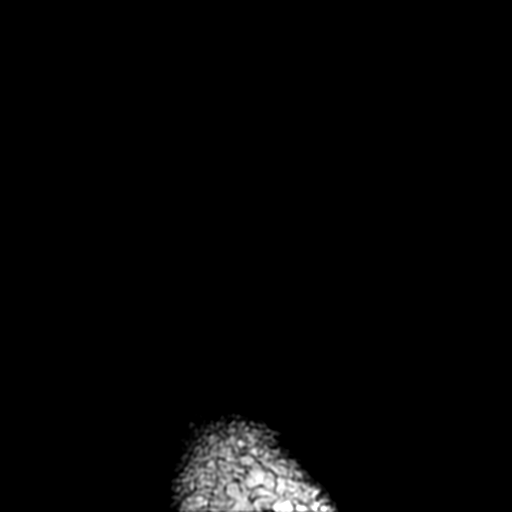
[im 13/25]
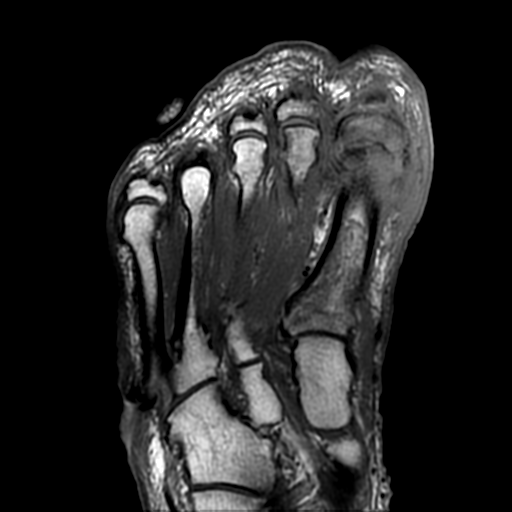
[im 25/25]
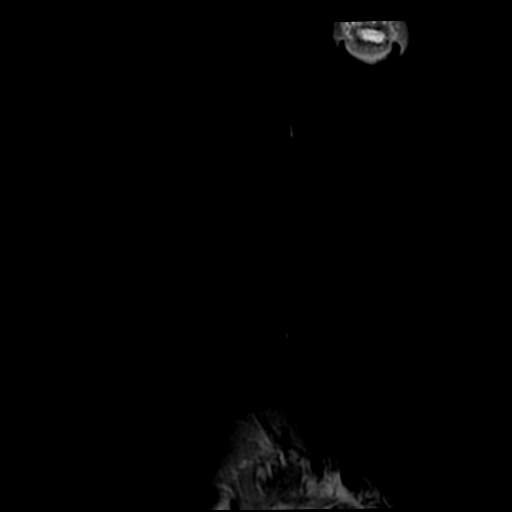

[Series 11: T1 · axial · 3.0mm · 0.27mm/px · z∈[-18,+33]mm · 2 of 17 slices shown (3 of 3)]
[im 1/17]
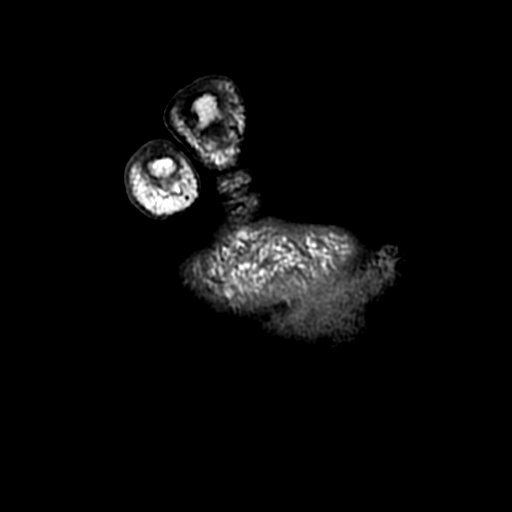
[im 17/17]
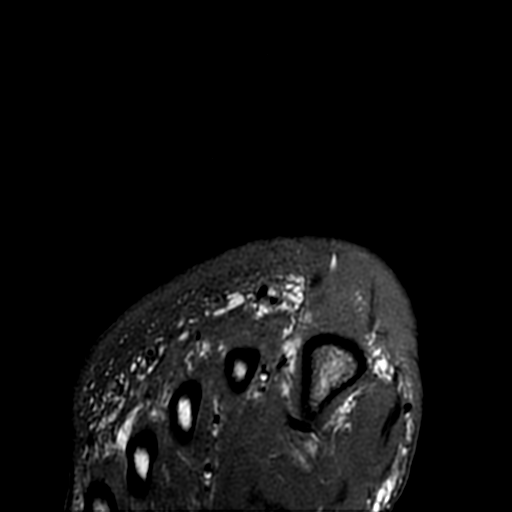

[Series 12: T1 fat-sat post-contrast · coronal · 4.0mm · 0.27mm/px · 4 of 40 slices shown]
[im 1/40]
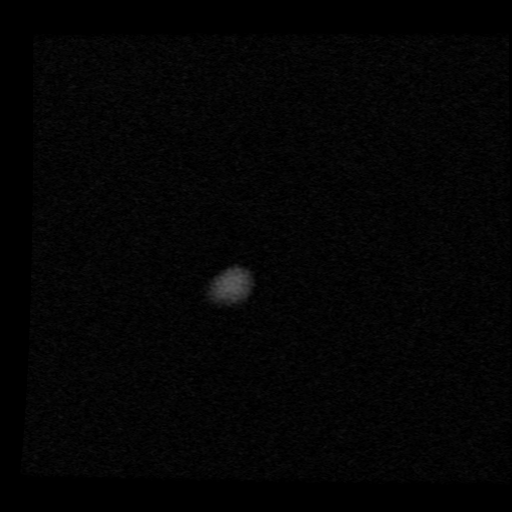
[im 14/40]
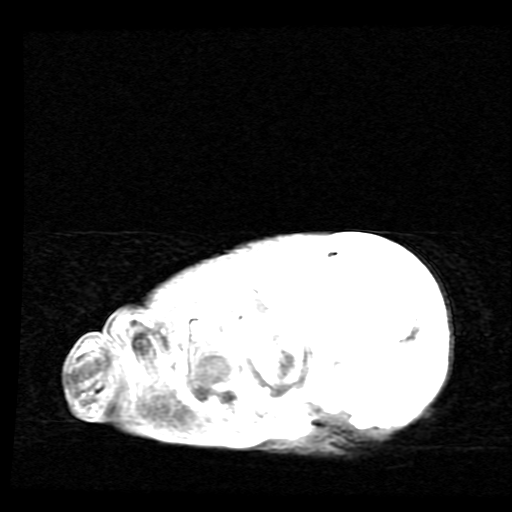
[im 27/40]
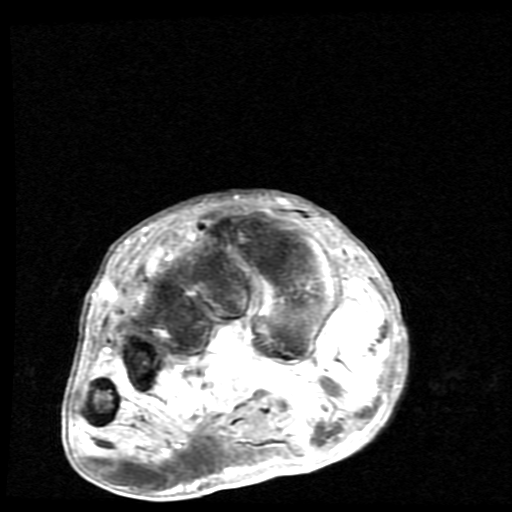
[im 40/40]
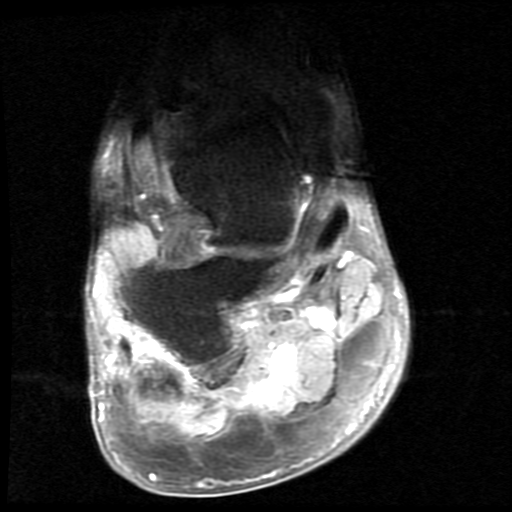

[21 of 40 positions shown; findings below may reference images not displayed]

FINDINGS: Bones/Joint/Cartilage

Since the prior examination, the patient developed marrow edema and
enhancement throughout the first metatarsal and great toe. Edema and
enhancement are most intense about the first MTP joint but the
appearance is most consistent with osteomyelitis throughout. Small
first MTP joint effusion is noted.

The patient also has new edema and enhancement in almost the entire
second metatarsal, most intense in the head and neck of the
metatarsal. Edema and enhancement are also seen throughout the
proximal phalanx of the second toe.

New intense edema and enhancement are seen in the proximal 2.5 cm of
the fifth metatarsal.

Low level edema and enhancement are seen in the third, fourth and
fifth toes and head and necks of the third, fourth and fifth
metatarsals.

Ligaments

Intact.

Muscles and Tendons

Edema and enhancement are seen in the intrinsic musculature of the
foot. No intramuscular abscess is identified.

Soft tissues

A large skin wound is seen on the plantar surface of the foot
between the first and second metatarsal heads. There also skin wound
at the base of the fifth metatarsal. A subcutaneous fluid collection
superficial to the second MTP joint measures 0.8 cm long by 0.7 cm
craniocaudal by 0.5 cm transverse and is consistent with an abscess.
IMPRESSION: Markedly worsened appearance of the foot since the patient's prior
MRI 03/15/2017.

Findings consistent with osteomyelitis throughout almost the entire
first and second rays and proximal 2.5 cm of the fifth metatarsal.

Mild edema and enhancement in the third, fourth and fifth toes and
distal third, fourth and fifth metatarsals could be reactive rather
than due to osteomyelitis. Signal change and enhancement are worst
in the distal third metatarsal and third toe.

Large skin wound on the plantar surface of the foot between the
first and second metatarsal heads. Second skin ulceration at the
base of the fifth metatarsal is identified.

Edema and enhancement in intrinsic musculature the foot compatible
with myositis.

Small fluid collection just superior to the second MTP joint
consistent with abscess.

## 2018-03-27 MED ORDER — MUPIROCIN 2 % EX OINT: 1.0000 "application " | TOPICAL_OINTMENT | Freq: Every day | CUTANEOUS | 0 refills | Status: DC

## 2018-03-27 MED ORDER — DOXYCYCLINE HYCLATE 100 MG PO CAPS: 100.0000 mg | ORAL_CAPSULE | Freq: Two times a day (BID) | ORAL | 1 refills | Status: DC

## 2018-03-27 NOTE — Progress Notes (Signed)
Office Visit Note   Patient: Keith [name]           Date of Birth: Aug 26, 1965           MRN: [id number] Visit Date: 03/27/2018              Requested by: [name], [address], [place] [address] PCP: [name], FNP  Chief Complaint  Patient presents with  . Right Leg - Follow-up      HPI: The patient is a 52 yo gentleman who is seen for post operative follow up following a right transtibial amputation 06/13/2017. He reports he has been working and standing a lot as well as sleeping in his prosthesis sometimes. He noticed a small ulcer over the distal residual limb and noticed some odor over the past week. He reports not much drainage.   Assessment & Plan: Visit Diagnoses:  1. Acquired absence of right leg below knee [facility])     Plan: Will start Doxycycline 100 mg BID and Bactroban ointment to the small ulcer over the right BKA. Also recommended Drysol to the stump to help control sweating.  Have recommended he remain out of work for the next 10 days and keep the prosthesis off for the next 10 days as well. We discussed that he should not wear his prosthesis at night if possible in the future.  He will follow up 04/06/18.   Follow-Up Instructions: Return in about 10 days (around 04/06/2018).   Ortho Exam  Patient is alert, oriented, no adenopathy, well-dressed, normal affect, normal respiratory effort. The right transtibial amputation residual limb has a small ulcer over the lateral distal stump ~ 2cm in diameter and appears to be very superficial with 1-2 mm depth. No signs of peri wound irritation. Scant drainage, but odor present.  Imaging: No results found. No images are attached to the encounter.  Labs: Lab Results  Component Value Date   HGBA1C 6.4 (A) 01/06/2018   HGBA1C 6.4 (A) 11/04/2017   HGBA1C 5.4 08/27/2017   ESRSEDRATE 140 (H) 04/28/2017   ESRSEDRATE 127 (H) 03/15/2017   ESRSEDRATE 94 (H) 01/07/2017   CRP 14.9 (H) 04/28/2017     CRP 23.1 (H) 03/15/2017   CRP 1.8 (H) 01/08/2017   LABURIC 6.3 08/07/2009   REPTSTATUS 05/08/2017 FINAL 05/03/2017   GRAMSTAIN  04/29/2017    NO WBC SEEN RARE GRAM POSITIVE COCCI IN PAIRS IN SINGLES    CULT NO GROWTH 5 DAYS 05/03/2017   LABORGA METHICILLIN RESISTANT STAPHYLOCOCCUS AUREUS 04/29/2017     Lab Results  Component Value Date   ALBUMIN 3.6 08/19/2017   ALBUMIN 3.0 (L) 04/28/2017   ALBUMIN 3.0 (L) 01/15/2017   PREALBUMIN 7.4 (L) 03/15/2017   LABURIC 6.3 08/07/2009    Body mass index is 41.35 kg/m.  Orders:  No orders of the defined types were placed in this encounter.  Meds ordered this encounter  Medications  . doxycycline (VIBRAMYCIN) 100 MG capsule    Sig: Take 1 capsule (100 mg total) by mouth 2 (two) times daily.    Dispense:  28 capsule    Refill:  1  . mupirocin ointment (BACTROBAN) 2 %    Sig: Apply 1 application topically daily.    Dispense:  22 g    Refill:  0     Procedures: No procedures performed  Clinical Data: No additional findings.  ROS:  All other systems negative, except as noted in the HPI. Review of Systems  Objective: Vital Signs: Ht 5\' 9"  (1.753 m)   Wt 280 lb (127 kg)   BMI 41.35 kg/m   Specialty Comments:  No specialty comments available.  PMFS History: Patient Active Problem List   Diagnosis Date Noted  . Below knee amputation status, right 06/13/2017  . S/P transmetatarsal amputation of foot, right ([place]) 05/07/2017  . Wound infection   . Bacteremia   . Fever   . Sepsis ([place]) 03/15/2017  . CKD (chronic kidney disease) stage 3, GFR 30-59 ml/min (HCC) 03/15/2017  . Diabetic polyneuropathy associated with type 2 diabetes mellitus ([place]) 01/23/2017  . Right foot ulcer, limited to breakdown of skin ([place]) 01/07/2017  . AKI (acute kidney injury) ([place]) 09/28/2016  . Nausea vomiting and diarrhea 09/28/2016  . Onychomycosis of multiple toenails with type 2 diabetes mellitus ([place]) 08/29/2015  . MRSA carrier  04/20/2014  . Testosterone deficiency 04/20/2014  . Penile wart 02/22/2014  . HIV disease ([place])   . Insulin-requiring or dependent type II diabetes mellitus ([place]) 02/04/2014  . Dental anomaly 11/20/2012  . Arthritis of right knee 02/23/2012  . Hyperlipidemia 11/10/2011  . Chronic pain 08/07/2011  . Meralgia paraesthetica 04/23/2011  . ERECTILE DYSFUNCTION 08/22/2008  . Essential hypertension 05/19/2008   Past Medical History:  Diagnosis Date  . Abscess of right foot   . AIDS ([place])   . Chronic knee pain    right  . Chronic pain   . Diabetes type 2, uncontrolled ([place])    HgA1c 17.6 (04/27/2010)  . Diabetic foot ulcer ([place]) 01/2017   right foot  . Erectile dysfunction   . Genital warts   . HIV (human immunodeficiency virus infection) ([place]) 2009   CD4 count 100, VL 13800 (05/01/2010)  . Hypertension   . Osteomyelitis ([place])    h/o hand  . Osteomyelitis of metatarsal ([place]) 04/28/2017    Family History  Problem Relation Age of Onset  . Hypertension Mother   . Arthritis Father   . Hypertension Father   . Hypertension Brother   . Cancer Maternal Grandmother 18       unknown type of cancer  . Depression Paternal Grandmother     Past Surgical History:  Procedure Laterality Date  . AMPUTATION Right 05/02/2017   Procedure: AMPUTATION TRANSMETARSAL;  Surgeon: [name], MD;  Location: [facility];  Service: Orthopedics;  Laterality: Right;  . AMPUTATION Right 06/13/2017   Procedure: RIGHT BELOW KNEE AMPUTATION;  Surgeon: [name], MD;  Location: [facility];  Service: Orthopedics;  Laterality: Right;  . BELOW KNEE LEG AMPUTATION Right 06/13/2017  . HERNIA REPAIR    . I&D EXTREMITY Left 08/21/2014   Procedure: INCISION AND DRAINAGE LEFT SMALL FINGER;  Surgeon: Leanora Cover, MD;  Location: [facility];  Service: Orthopedics;  Laterality: Left;  . I&D EXTREMITY Right 03/18/2017   Procedure: IRRIGATION AND DEBRIDEMENT EXTREMITY;  Surgeon: [name], MD;  Location: [place];  Service:  Orthopedics;  Laterality: Right;  . MINOR IRRIGATION AND DEBRIDEMENT OF WOUND Right 04/22/2014   Procedure: IRRIGATION AND DEBRIDEMENT OF RIGHT NECK ABCESS;  Surgeon: Jerrell Belfast, MD;  Location: [facility];  Service: ENT;  Laterality: Right;  . MULTIPLE EXTRACTIONS WITH ALVEOLOPLASTY N/A 01/18/2013   Procedure: MULTIPLE EXTRACION 3, 6, 7, 10, 11, 13, 21, 22, 27, 28, 29, 30 WITH ALVEOLOPLASTY;  Surgeon: Gae Bon, DDS;  Location: [place];  Service: Oral Surgery;  Laterality: N/A;  . SKIN SPLIT GRAFT Right 03/21/2017   Procedure: IRRIGATION AND DEBRIDEMENT RIGHT  FOOT AND APPLY SPLIT THICKNESS SKIN GRAFT AND WOUND VAC;  Surgeon: [name], MD;  Location: [place];  Service: Orthopedics;  Laterality: Right;  . TEE WITHOUT CARDIOVERSION N/A 05/02/2017   Procedure: TRANSESOPHAGEAL ECHOCARDIOGRAM (TEE);  Surgeon: Acie Fredrickson Wonda Cheng, MD;  Location: [facility] OR;  Service: Cardiovascular;  Laterality: N/A;  coincidental to orthopedic case   Social History   Occupational History  . Not on file  Tobacco Use  . Smoking status: Never Smoker  . Smokeless tobacco: Never Used  Substance and Sexual Activity  . Alcohol use: No    Alcohol/week: 0.0 standard drinks  . Drug use: No  . Sexual activity: Yes    Partners: Female    Comment: given condoms

## 2018-03-28 DIAGNOSIS — G894 Chronic pain syndrome: Secondary | ICD-10-CM | POA: Diagnosis not present

## 2018-03-28 DIAGNOSIS — Z79899 Other long term (current) drug therapy: Secondary | ICD-10-CM | POA: Diagnosis not present

## 2018-03-28 DIAGNOSIS — Z6841 Body Mass Index (BMI) 40.0 and over, adult: Secondary | ICD-10-CM | POA: Diagnosis not present

## 2018-03-28 DIAGNOSIS — G546 Phantom limb syndrome with pain: Secondary | ICD-10-CM | POA: Diagnosis not present

## 2018-04-03 ENCOUNTER — Encounter (INDEPENDENT_AMBULATORY_CARE_PROVIDER_SITE_OTHER): Payer: Self-pay | Admitting: Physician Assistant

## 2018-04-03 ENCOUNTER — Ambulatory Visit (INDEPENDENT_AMBULATORY_CARE_PROVIDER_SITE_OTHER): Payer: Medicaid Other | Admitting: Physician Assistant

## 2018-04-03 DIAGNOSIS — L97921 Non-pressure chronic ulcer of unspecified part of left lower leg limited to breakdown of skin: Secondary | ICD-10-CM

## 2018-04-03 NOTE — Progress Notes (Signed)
Office Visit Note   Patient: Keith [name]           Date of Birth: Aug 25, 1965           MRN: [id number] Visit Date: 04/03/2018              Requested by: [name], [address] [place], [place] [address] PCP: [name], FNP  Chief Complaint  Patient presents with  . Left Leg - Follow-up      HPI: The patient is a 52 yo gentleman who walks into the clinic today to be seen due to new ulcers over the left lower leg. He has not been able to obtain compression stockings due to financial constraints and reports he just noticed blisters and new ulcers over the left posterior calf today. He denies any trauma to the area.  ? Burns due to heating pad.   Assessment & Plan: Visit Diagnoses:  1. Ulcer of left lower leg, limited to breakdown of skin ([place])     Plan: Patient is allergic to sulfa, so we cannot use silvadene cream. Will have him use Bactroban ointment which he already has and continue Doxycycline by mouth for the new ulcers. He will follow up next week.  Would like to use compression socks,and he reports he may be able to pick some up over the next couple of weeks when he has disability money.   Follow-Up Instructions: Return in about 3 days (around 04/06/2018).   Ortho Exam  Patient is alert, oriented, no adenopathy, well-dressed, normal affect, normal respiratory effort. Left lower leg- 2 open blistered areas ~ 4 x 5 cm over the posterior calf and 1 intact blister ~2 x 3 cm present. Tender to palpation. Edema of left lower leg   Imaging: No results found. No images are attached to the encounter.  Labs: Lab Results  Component Value Date   HGBA1C 6.4 (A) 01/06/2018   HGBA1C 6.4 (A) 11/04/2017   HGBA1C 5.4 08/27/2017   ESRSEDRATE 140 (H) 04/28/2017   ESRSEDRATE 127 (H) 03/15/2017   ESRSEDRATE 94 (H) 01/07/2017   CRP 14.9 (H) 04/28/2017   CRP 23.1 (H) 03/15/2017   CRP 1.8 (H) 01/08/2017   LABURIC 6.3 08/07/2009   REPTSTATUS 05/08/2017 FINAL  05/03/2017   GRAMSTAIN  04/29/2017    NO WBC SEEN RARE GRAM POSITIVE COCCI IN PAIRS IN SINGLES    CULT NO GROWTH 5 DAYS 05/03/2017   LABORGA METHICILLIN RESISTANT STAPHYLOCOCCUS AUREUS 04/29/2017     Lab Results  Component Value Date   ALBUMIN 3.6 08/19/2017   ALBUMIN 3.0 (L) 04/28/2017   ALBUMIN 3.0 (L) 01/15/2017   PREALBUMIN 7.4 (L) 03/15/2017   LABURIC 6.3 08/07/2009    There is no height or weight on file to calculate BMI.  Orders:  No orders of the defined types were placed in this encounter.  No orders of the defined types were placed in this encounter.    Procedures: No procedures performed  Clinical Data: No additional findings.  ROS:  All other systems negative, except as noted in the HPI. Review of Systems  Objective: Vital Signs: There were no vitals taken for this visit.  Specialty Comments:  No specialty comments available.  PMFS History: Patient Active Problem List   Diagnosis Date Noted  . Below knee amputation status, right 06/13/2017  . S/P transmetatarsal amputation of foot, right ([place]) 05/07/2017  . Wound infection   . Bacteremia   . Fever   .  Sepsis ([place]) 03/15/2017  . CKD (chronic kidney disease) stage 3, GFR 30-59 ml/min (HCC) 03/15/2017  . Diabetic polyneuropathy associated with type 2 diabetes mellitus ([place]) 01/23/2017  . Right foot ulcer, limited to breakdown of skin ([place]) 01/07/2017  . AKI (acute kidney injury) ([place]) 09/28/2016  . Nausea vomiting and diarrhea 09/28/2016  . Onychomycosis of multiple toenails with type 2 diabetes mellitus ([place]) 08/29/2015  . MRSA carrier 04/20/2014  . Testosterone deficiency 04/20/2014  . Penile wart 02/22/2014  . HIV disease ([place])   . Insulin-requiring or dependent type II diabetes mellitus ([place]) 02/04/2014  . Dental anomaly 11/20/2012  . Arthritis of right knee 02/23/2012  . Hyperlipidemia 11/10/2011  . Chronic pain 08/07/2011  . Meralgia paraesthetica 04/23/2011  . ERECTILE DYSFUNCTION  08/22/2008  . Essential hypertension 05/19/2008   Past Medical History:  Diagnosis Date  . Abscess of right foot   . AIDS ([place])   . Chronic knee pain    right  . Chronic pain   . Diabetes type 2, uncontrolled ([place])    HgA1c 17.6 (04/27/2010)  . Diabetic foot ulcer ([place]) 01/2017   right foot  . Erectile dysfunction   . Genital warts   . HIV (human immunodeficiency virus infection) ([place]) 2009   CD4 count 100, VL 13800 (05/01/2010)  . Hypertension   . Osteomyelitis ([place])    h/o hand  . Osteomyelitis of metatarsal ([place]) 04/28/2017    Family History  Problem Relation Age of Onset  . Hypertension Mother   . Arthritis Father   . Hypertension Father   . Hypertension Brother   . Cancer Maternal Grandmother 59       unknown type of cancer  . Depression Paternal Grandmother     Past Surgical History:  Procedure Laterality Date  . AMPUTATION Right 05/02/2017   Procedure: AMPUTATION TRANSMETARSAL;  Surgeon: [name], MD;  Location: [facility];  Service: Orthopedics;  Laterality: Right;  . AMPUTATION Right 06/13/2017   Procedure: RIGHT BELOW KNEE AMPUTATION;  Surgeon: [name], MD;  Location: [facility];  Service: Orthopedics;  Laterality: Right;  . BELOW KNEE LEG AMPUTATION Right 06/13/2017  . HERNIA REPAIR    . I&D EXTREMITY Left 08/21/2014   Procedure: INCISION AND DRAINAGE LEFT SMALL FINGER;  Surgeon: Leanora Cover, MD;  Location: [facility];  Service: Orthopedics;  Laterality: Left;  . I&D EXTREMITY Right 03/18/2017   Procedure: IRRIGATION AND DEBRIDEMENT EXTREMITY;  Surgeon: [name], MD;  Location: [facility];  Service: Orthopedics;  Laterality: Right;  . MINOR IRRIGATION AND DEBRIDEMENT OF WOUND Right 04/22/2014   Procedure: IRRIGATION AND DEBRIDEMENT OF RIGHT NECK ABCESS;  Surgeon: Jerrell Belfast, MD;  Location: [facility];  Service: ENT;  Laterality: Right;  . MULTIPLE EXTRACTIONS WITH ALVEOLOPLASTY N/A 01/18/2013   Procedure: MULTIPLE EXTRACION 3, 6, 7, 10, 11, 13, 21, 22, 27, 28, 29,  30 WITH ALVEOLOPLASTY;  Surgeon: Gae Bon, DDS;  Location: [facility];  Service: Oral Surgery;  Laterality: N/A;  . SKIN SPLIT GRAFT Right 03/21/2017   Procedure: IRRIGATION AND DEBRIDEMENT RIGHT FOOT AND APPLY SPLIT THICKNESS SKIN GRAFT AND WOUND VAC;  Surgeon: [name], MD;  Location: [facility];  Service: Orthopedics;  Laterality: Right;  . TEE WITHOUT CARDIOVERSION N/A 05/02/2017   Procedure: TRANSESOPHAGEAL ECHOCARDIOGRAM (TEE);  Surgeon: Acie Fredrickson Wonda Cheng, MD;  Location[facility];  Service: Cardiovascular;  Laterality: N/A;  coincidental to orthopedic case   Social History   Occupational History  . Not on file  Tobacco Use  . Smoking status: Never Smoker  . Smokeless tobacco: Never Used  Substance and Sexual Activity  . Alcohol use: No    Alcohol/week: 0.0 standard drinks  . Drug use: No  . Sexual activity: Yes    Partners: Female    Comment: given condoms

## 2018-04-06 ENCOUNTER — Ambulatory Visit (INDEPENDENT_AMBULATORY_CARE_PROVIDER_SITE_OTHER): Payer: Medicare Other | Admitting: Physician Assistant

## 2018-04-10 ENCOUNTER — Ambulatory Visit (INDEPENDENT_AMBULATORY_CARE_PROVIDER_SITE_OTHER): Payer: Medicare Other | Admitting: Physician Assistant

## 2018-04-10 ENCOUNTER — Encounter (INDEPENDENT_AMBULATORY_CARE_PROVIDER_SITE_OTHER): Payer: Self-pay | Admitting: Physician Assistant

## 2018-04-10 ENCOUNTER — Ambulatory Visit: Payer: Medicare Other | Admitting: Family Medicine

## 2018-04-10 VITALS — Ht 69.0 in | Wt 280.0 lb

## 2018-04-10 DIAGNOSIS — L97921 Non-pressure chronic ulcer of unspecified part of left lower leg limited to breakdown of skin: Secondary | ICD-10-CM

## 2018-04-10 DIAGNOSIS — E1142 Type 2 diabetes mellitus with diabetic polyneuropathy: Secondary | ICD-10-CM | POA: Diagnosis not present

## 2018-04-10 DIAGNOSIS — B351 Tinea unguium: Secondary | ICD-10-CM

## 2018-04-10 DIAGNOSIS — E1169 Type 2 diabetes mellitus with other specified complication: Secondary | ICD-10-CM

## 2018-04-10 DIAGNOSIS — I872 Venous insufficiency (chronic) (peripheral): Secondary | ICD-10-CM | POA: Diagnosis not present

## 2018-04-10 DIAGNOSIS — Z89511 Acquired absence of right leg below knee: Secondary | ICD-10-CM

## 2018-04-10 NOTE — Progress Notes (Signed)
Office Visit Note   Patient: Keith [name]           Date of Birth: February 18, 1966           MRN: [id number] Visit Date: 04/10/2018              Requested by: [name], [address] [place], [place] [address] PCP: [name], FNP  Chief Complaint  Patient presents with  . Left Leg - Follow-up      HPI: The patient is a 53 year old gentleman who is seen for follow-up of his left lower leg blisters/open wounds.  He has been utilizing mupirocin ointment to the areas and Ace wrapping for edema control.  He has had continued edema and drainage.  He is on doxycycline.  He was not able to get his compression stockings as yet and we did provide him a prescription today for a 2 XL vive compression sock.  Assessment & Plan: Visit Diagnoses:  1. Ulcer of left lower leg, limited to breakdown of skin ([place])   2. Acquired absence of right leg below knee (HCC)   3. Diabetic polyneuropathy associated with type 2 diabetes mellitus ([place])   4. Onychomycosis of multiple toenails with type 2 diabetes mellitus ([place])   5. Venous insufficiency (chronic) (peripheral)     Plan: Silvercel was applied to the wound bed, then Unna paste and then Dynaflex layered compression wrap was applied. The patient will follow up next week.  He reports he can go and get a Vive compression sock and was given prescription for 2 XL socks. He will bring them to his next visit.   Follow-Up Instructions: Return in about 3 days (around 04/13/2018).   Ortho Exam  Patient is alert, oriented, no adenopathy, well-dressed, normal affect, normal respiratory effort. The left lower leg ulcers/blisters have opened and  coalesced and is ~ 15 x 10 cm total and has fat layer exposed. No peri wound irritation or signs of cellulitis. Moderate pitting edema of the left leg. Calf is ~ 48 cm.   Imaging: No results found.   Labs: Lab Results  Component Value Date   HGBA1C 6.4 (A) 01/06/2018   HGBA1C 6.4 (A) 11/04/2017    HGBA1C 5.4 08/27/2017   ESRSEDRATE 140 (H) 04/28/2017   ESRSEDRATE 127 (H) 03/15/2017   ESRSEDRATE 94 (H) 01/07/2017   CRP 14.9 (H) 04/28/2017   CRP 23.1 (H) 03/15/2017   CRP 1.8 (H) 01/08/2017   LABURIC 6.3 08/07/2009   REPTSTATUS 05/08/2017 FINAL 05/03/2017   GRAMSTAIN  04/29/2017    NO WBC SEEN RARE GRAM POSITIVE COCCI IN PAIRS IN SINGLES    CULT NO GROWTH 5 DAYS 05/03/2017   LABORGA METHICILLIN RESISTANT STAPHYLOCOCCUS AUREUS 04/29/2017     Lab Results  Component Value Date   ALBUMIN 3.6 08/19/2017   ALBUMIN 3.0 (L) 04/28/2017   ALBUMIN 3.0 (L) 01/15/2017   PREALBUMIN 7.4 (L) 03/15/2017   LABURIC 6.3 08/07/2009    Body mass index is 41.35 kg/m.  Orders:  No orders of the defined types were placed in this encounter.  No orders of the defined types were placed in this encounter.    Procedures: No procedures performed  Clinical Data: No additional findings.  ROS:  All other systems negative, except as noted in the HPI. Review of Systems  Objective: Vital Signs: Ht 5\' 9"  (1.753 m)   Wt 280 lb (127 kg)   BMI 41.35 kg/m   Specialty Comments:  No specialty  comments available.  PMFS History: Patient Active Problem List   Diagnosis Date Noted  . Below knee amputation status, right 06/13/2017  . S/P transmetatarsal amputation of foot, right ([place]) 05/07/2017  . Wound infection   . Bacteremia   . Fever   . Sepsis ([place]) 03/15/2017  . CKD (chronic kidney disease) stage 3, GFR 30-59 ml/min (HCC) 03/15/2017  . Diabetic polyneuropathy associated with type 2 diabetes mellitus ([place]) 01/23/2017  . Right foot ulcer, limited to breakdown of skin ([place]) 01/07/2017  . AKI (acute kidney injury) ([place]) 09/28/2016  . Nausea vomiting and diarrhea 09/28/2016  . Onychomycosis of multiple toenails with type 2 diabetes mellitus ([place]) 08/29/2015  . MRSA carrier 04/20/2014  . Testosterone deficiency 04/20/2014  . Penile wart 02/22/2014  . HIV disease ([place])   .  Insulin-requiring or dependent type II diabetes mellitus ([place]) 02/04/2014  . Dental anomaly 11/20/2012  . Arthritis of right knee 02/23/2012  . Hyperlipidemia 11/10/2011  . Chronic pain 08/07/2011  . Meralgia paraesthetica 04/23/2011  . ERECTILE DYSFUNCTION 08/22/2008  . Essential hypertension 05/19/2008   Past Medical History:  Diagnosis Date  . Abscess of right foot   . AIDS ([place])   . Chronic knee pain    right  . Chronic pain   . Diabetes type 2, uncontrolled ([place])    HgA1c 17.6 (04/27/2010)  . Diabetic foot ulcer ([place]) 01/2017   right foot  . Erectile dysfunction   . Genital warts   . HIV (human immunodeficiency virus infection) ([place]) 2009   CD4 count 100, VL 13800 (05/01/2010)  . Hypertension   . Osteomyelitis ([place])    h/o hand  . Osteomyelitis of metatarsal ([place]) 04/28/2017    Family History  Problem Relation Age of Onset  . Hypertension Mother   . Arthritis Father   . Hypertension Father   . Hypertension Brother   . Cancer Maternal Grandmother 44       unknown type of cancer  . Depression Paternal Grandmother     Past Surgical History:  Procedure Laterality Date  . AMPUTATION Right 05/02/2017   Procedure: AMPUTATION TRANSMETARSAL;  Surgeon: [name], MD;  Location: [facility];  Service: Orthopedics;  Laterality: Right;  . AMPUTATION Right 06/13/2017   Procedure: RIGHT BELOW KNEE AMPUTATION;  Surgeon: [name], MD;  Location: [facility];  Service: Orthopedics;  Laterality: Right;  . BELOW KNEE LEG AMPUTATION Right 06/13/2017  . HERNIA REPAIR    . I&D EXTREMITY Left 08/21/2014   Procedure: INCISION AND DRAINAGE LEFT SMALL FINGER;  Surgeon: Leanora Cover, MD;  Location: [facility];  Service: Orthopedics;  Laterality: Left;  . I&D EXTREMITY Right 03/18/2017   Procedure: IRRIGATION AND DEBRIDEMENT EXTREMITY;  Surgeon: [name], MD;  Location: [facility];  Service: Orthopedics;  Laterality: Right;  . MINOR IRRIGATION AND DEBRIDEMENT OF WOUND Right 04/22/2014   Procedure:  IRRIGATION AND DEBRIDEMENT OF RIGHT NECK ABCESS;  Surgeon: Jerrell Belfast, MD;  Location: [facility];  Service: ENT;  Laterality: Right;  . MULTIPLE EXTRACTIONS WITH ALVEOLOPLASTY N/A 01/18/2013   Procedure: MULTIPLE EXTRACION 3, 6, 7, 10, 11, 13, 21, 22, 27, 28, 29, 30 WITH ALVEOLOPLASTY;  Surgeon: Gae Bon, DDS;  Location: [facility];  Service: Oral Surgery;  Laterality: N/A;  . SKIN SPLIT GRAFT Right 03/21/2017   Procedure: IRRIGATION AND DEBRIDEMENT RIGHT FOOT AND APPLY SPLIT THICKNESS SKIN GRAFT AND WOUND VAC;  Surgeon: [name], MD;  Location: [facility];  Service: Orthopedics;  Laterality: Right;  .  TEE WITHOUT CARDIOVERSION N/A 05/02/2017   Procedure: TRANSESOPHAGEAL ECHOCARDIOGRAM (TEE);  Surgeon: Acie Fredrickson Wonda Cheng, MD;  Location: [facility] OR;  Service: Cardiovascular;  Laterality: N/A;  coincidental to orthopedic case   Social History   Occupational History  . Not on file  Tobacco Use  . Smoking status: Never Smoker  . Smokeless tobacco: Never Used  Substance and Sexual Activity  . Alcohol use: No    Alcohol/week: 0.0 standard drinks  . Drug use: No  . Sexual activity: Yes    Partners: Female    Comment: given condoms

## 2018-04-13 ENCOUNTER — Ambulatory Visit (INDEPENDENT_AMBULATORY_CARE_PROVIDER_SITE_OTHER): Payer: Medicare Other | Admitting: Orthopedic Surgery

## 2018-04-13 ENCOUNTER — Encounter (INDEPENDENT_AMBULATORY_CARE_PROVIDER_SITE_OTHER): Payer: Self-pay | Admitting: Physician Assistant

## 2018-04-13 VITALS — Ht 69.0 in | Wt 280.0 lb

## 2018-04-13 DIAGNOSIS — L97921 Non-pressure chronic ulcer of unspecified part of left lower leg limited to breakdown of skin: Secondary | ICD-10-CM

## 2018-04-13 NOTE — Progress Notes (Signed)
Office Visit Note   Patient: Keith [name]           Date of Birth: Aug 01, 1965           MRN: [id number] Visit Date: 04/13/2018              Requested by: [name], [address], [place] [address] PCP: [name], FNP  Chief Complaint  Patient presents with  . Left Leg - Follow-up      HPI: Patient is a 53 year old gentleman right transtibial amputation complains of neuropathic pain on the right currently taking Neurontin he states that this does not help patient has a massive venous stasis ulcer on the lateral aspect the left leg was placed in a Dynaflex wrap and was given a prescription to get a medical compression sock he states he has not gotten the sock yet.  Assessment & Plan: Visit Diagnoses:  1. Ulcer of left lower leg, limited to breakdown of skin ([place])     Plan: We will rewrap the left leg with a Dynaflex wrap follow-up to apply the sock at his next follow-up visit.  Follow-Up Instructions: Return in about 1 week (around 04/20/2018).   Ortho Exam  Patient is alert, oriented, no adenopathy, well-dressed, normal affect, normal respiratory effort. Examination patient has wrinkling of the skin of the left lower extremity there is no redness no cellulitis no odor or drainage.  The ulcer is superficial with approximately 50% fibrinous exudative tissue the ulcer measures 10 x 10 cm.  There is no exposed bone or tendon no cellulitis.  Imaging: No results found. No images are attached to the encounter.  Labs: Lab Results  Component Value Date   HGBA1C 6.4 (A) 01/06/2018   HGBA1C 6.4 (A) 11/04/2017   HGBA1C 5.4 08/27/2017   ESRSEDRATE 140 (H) 04/28/2017   ESRSEDRATE 127 (H) 03/15/2017   ESRSEDRATE 94 (H) 01/07/2017   CRP 14.9 (H) 04/28/2017   CRP 23.1 (H) 03/15/2017   CRP 1.8 (H) 01/08/2017   LABURIC 6.3 08/07/2009   REPTSTATUS 05/08/2017 FINAL 05/03/2017   GRAMSTAIN  04/29/2017    NO WBC SEEN RARE GRAM POSITIVE COCCI IN PAIRS IN  SINGLES    CULT NO GROWTH 5 DAYS 05/03/2017   LABORGA METHICILLIN RESISTANT STAPHYLOCOCCUS AUREUS 04/29/2017     Lab Results  Component Value Date   ALBUMIN 3.6 08/19/2017   ALBUMIN 3.0 (L) 04/28/2017   ALBUMIN 3.0 (L) 01/15/2017   PREALBUMIN 7.4 (L) 03/15/2017   LABURIC 6.3 08/07/2009    Body mass index is 41.35 kg/m.  Orders:  No orders of the defined types were placed in this encounter.  No orders of the defined types were placed in this encounter.    Procedures: No procedures performed  Clinical Data: No additional findings.  ROS:  All other systems negative, except as noted in the HPI. Review of Systems  Objective: Vital Signs: Ht 5\' 9"  (1.753 m)   Wt 280 lb (127 kg)   BMI 41.35 kg/m   Specialty Comments:  No specialty comments available.  PMFS History: Patient Active Problem List   Diagnosis Date Noted  . Below knee amputation status, right 06/13/2017  . S/P transmetatarsal amputation of foot, right ([place]) 05/07/2017  . Wound infection   . Bacteremia   . Fever   . Sepsis ([place]) 03/15/2017  . CKD (chronic kidney disease) stage 3, GFR 30-59 ml/min (HCC) 03/15/2017  . Diabetic polyneuropathy associated with type 2 diabetes mellitus ([place])  01/23/2017  . Right foot ulcer, limited to breakdown of skin ([place]) 01/07/2017  . AKI (acute kidney injury) ([place]) 09/28/2016  . Nausea vomiting and diarrhea 09/28/2016  . Onychomycosis of multiple toenails with type 2 diabetes mellitus ([place]) 08/29/2015  . MRSA carrier 04/20/2014  . Testosterone deficiency 04/20/2014  . Penile wart 02/22/2014  . HIV disease ([place])   . Insulin-requiring or dependent type II diabetes mellitus ([place]) 02/04/2014  . Dental anomaly 11/20/2012  . Arthritis of right knee 02/23/2012  . Hyperlipidemia 11/10/2011  . Chronic pain 08/07/2011  . Meralgia paraesthetica 04/23/2011  . ERECTILE DYSFUNCTION 08/22/2008  . Essential hypertension 05/19/2008   Past Medical History:  Diagnosis Date  .  Abscess of right foot   . AIDS ([place])   . Chronic knee pain    right  . Chronic pain   . Diabetes type 2, uncontrolled ([place])    HgA1c 17.6 (04/27/2010)  . Diabetic foot ulcer ([place]) 01/2017   right foot  . Erectile dysfunction   . Genital warts   . HIV (human immunodeficiency virus infection) ([place]) 2009   CD4 count 100, VL 13800 (05/01/2010)  . Hypertension   . Osteomyelitis ([place])    h/o hand  . Osteomyelitis of metatarsal ([place]) 04/28/2017    Family History  Problem Relation Age of Onset  . Hypertension Mother   . Arthritis Father   . Hypertension Father   . Hypertension Brother   . Cancer Maternal Grandmother 104       unknown type of cancer  . Depression Paternal Grandmother     Past Surgical History:  Procedure Laterality Date  . AMPUTATION Right 05/02/2017   Procedure: AMPUTATION TRANSMETARSAL;  Surgeon: [name], MD;  Location: [facility];  Service: Orthopedics;  Laterality: Right;  . AMPUTATION Right 06/13/2017   Procedure: RIGHT BELOW KNEE AMPUTATION;  Surgeon: [name], MD;  Location: [facility];  Service: Orthopedics;  Laterality: Right;  . BELOW KNEE LEG AMPUTATION Right 06/13/2017  . HERNIA REPAIR    . I&D EXTREMITY Left 08/21/2014   Procedure: INCISION AND DRAINAGE LEFT SMALL FINGER;  Surgeon: Leanora Cover, MD;  Location: [facility];  Service: Orthopedics;  Laterality: Left;  . I&D EXTREMITY Right 03/18/2017   Procedure: IRRIGATION AND DEBRIDEMENT EXTREMITY;  Surgeon: [name], MD;  Location: [facility];  Service: Orthopedics;  Laterality: Right;  . MINOR IRRIGATION AND DEBRIDEMENT OF WOUND Right 04/22/2014   Procedure: IRRIGATION AND DEBRIDEMENT OF RIGHT NECK ABCESS;  Surgeon: Jerrell Belfast, MD;  Location: [facility];  Service: ENT;  Laterality: Right;  . MULTIPLE EXTRACTIONS WITH ALVEOLOPLASTY N/A 01/18/2013   Procedure: MULTIPLE EXTRACION 3, 6, 7, 10, 11, 13, 21, 22, 27, 28, 29, 30 WITH ALVEOLOPLASTY;  Surgeon: Gae Bon, DDS;  Location: [facility];  Service: Oral Surgery;   Laterality: N/A;  . SKIN SPLIT GRAFT Right 03/21/2017   Procedure: IRRIGATION AND DEBRIDEMENT RIGHT FOOT AND APPLY SPLIT THICKNESS SKIN GRAFT AND WOUND VAC;  Surgeon: [name], MD;  Location: [facility];  Service: Orthopedics;  Laterality: Right;  . TEE WITHOUT CARDIOVERSION N/A 05/02/2017   Procedure: TRANSESOPHAGEAL ECHOCARDIOGRAM (TEE);  Surgeon: Acie Fredrickson Wonda Cheng, MD;  [facility];  Service: Cardiovascular;  Laterality: N/A;  coincidental to orthopedic case   Social History   Occupational History  . Not on file  Tobacco Use  . Smoking status: Never Smoker  . Smokeless tobacco: Never Used  Substance and Sexual Activity  . Alcohol use: No  Alcohol/week: 0.0 standard drinks  . Drug use: No  . Sexual activity: Yes    Partners: Female    Comment: given condoms

## 2018-04-22 ENCOUNTER — Encounter (INDEPENDENT_AMBULATORY_CARE_PROVIDER_SITE_OTHER): Payer: Self-pay | Admitting: Family

## 2018-04-22 ENCOUNTER — Ambulatory Visit (INDEPENDENT_AMBULATORY_CARE_PROVIDER_SITE_OTHER): Payer: Medicare Other | Admitting: Orthopedic Surgery

## 2018-04-22 ENCOUNTER — Ambulatory Visit (INDEPENDENT_AMBULATORY_CARE_PROVIDER_SITE_OTHER): Payer: Medicare Other | Admitting: Family

## 2018-04-22 VITALS — Ht 69.0 in | Wt 280.0 lb

## 2018-04-22 DIAGNOSIS — L97221 Non-pressure chronic ulcer of left calf limited to breakdown of skin: Secondary | ICD-10-CM

## 2018-04-22 DIAGNOSIS — I872 Venous insufficiency (chronic) (peripheral): Secondary | ICD-10-CM

## 2018-04-23 ENCOUNTER — Encounter (INDEPENDENT_AMBULATORY_CARE_PROVIDER_SITE_OTHER): Payer: Self-pay | Admitting: Family

## 2018-04-23 DIAGNOSIS — I872 Venous insufficiency (chronic) (peripheral): Secondary | ICD-10-CM | POA: Insufficient documentation

## 2018-04-23 DIAGNOSIS — L97221 Non-pressure chronic ulcer of left calf limited to breakdown of skin: Principal | ICD-10-CM

## 2018-04-23 NOTE — Progress Notes (Signed)
Office Visit Note   Patient: Keith [name]           Date of Birth: [birth year]           MRN: [id number] Visit Date: 04/22/2018              Requested by: [name], [address], [place] [address] PCP: [name], FNP  Chief Complaint  Patient presents with  . Left Leg - Follow-up      HPI: Patient is a 53 year old gentleman seen today in follow-up for a massive venous stasis ulcer on the lateral aspect the left leg.  He was placed in a Dynaflex wrap and was given a prescription to get a medical compression sock. he states he has not gotten the sock yet.  The Dynaflex wrap has rolled down this did not stay up well since last visit.  It is been 10 days since his last visit.  Assessment & Plan: Visit Diagnoses:  No diagnosis found.  Plan: We will rewrap the left leg with a Unna Dynaflex wrap.  Iodosorb placed over the wound.  Plan to apply the medical compression sock at his next follow-up visit.  Patient is working on obtaining this.  Follow-Up Instructions: No follow-ups on file.   Ortho Exam  Patient is alert, oriented, no adenopathy, well-dressed, normal affect, normal respiratory effort. Examination patient has edema to the left lower extremity.  No wrinkling of the skin.  There is no redness no cellulitis no odor or drainage.  The ulcer is superficial with approximately 75% fibrinous exudative tissue.  Little granulation tissue.  No surrounding maceration.  No drainage.  There is no exposed bone or tendon no cellulitis.  Imaging: No results found. No images are attached to the encounter.  Labs: Lab Results  Component Value Date   HGBA1C 6.4 (A) 01/06/2018   HGBA1C 6.4 (A) 11/04/2017   HGBA1C 5.4 08/27/2017   ESRSEDRATE 140 (H) 04/28/2017   ESRSEDRATE 127 (H) 03/15/2017   ESRSEDRATE 94 (H) 01/07/2017   CRP 14.9 (H) 04/28/2017   CRP 23.1 (H) 03/15/2017   CRP 1.8 (H) 01/08/2017   LABURIC 6.3 08/07/2009   REPTSTATUS 05/08/2017 FINAL  05/03/2017   GRAMSTAIN  04/29/2017    NO WBC SEEN RARE GRAM POSITIVE COCCI IN PAIRS IN SINGLES    CULT NO GROWTH 5 DAYS 05/03/2017   LABORGA METHICILLIN RESISTANT STAPHYLOCOCCUS AUREUS 04/29/2017     Lab Results  Component Value Date   ALBUMIN 3.6 08/19/2017   ALBUMIN 3.0 (L) 04/28/2017   ALBUMIN 3.0 (L) 01/15/2017   PREALBUMIN 7.4 (L) 03/15/2017   LABURIC 6.3 08/07/2009    Body mass index is 41.35 kg/m.  Orders:  No orders of the defined types were placed in this encounter.  No orders of the defined types were placed in this encounter.    Procedures: No procedures performed  Clinical Data: No additional findings.  ROS:  All other systems negative, except as noted in the HPI. Review of Systems  Constitutional: Negative for chills and fever.  Cardiovascular: Positive for leg swelling.  Skin: Positive for wound.    Objective: Vital Signs: Ht 5\' 9"  (1.753 m)   Wt 280 lb (127 kg)   BMI 41.35 kg/m   Specialty Comments:  No specialty comments available.  PMFS History: Patient Active Problem List   Diagnosis Date Noted  . Below knee amputation status, right 06/13/2017  . S/P transmetatarsal amputation of foot, right ([place]) 05/07/2017  .  Wound infection   . Bacteremia   . Fever   . Sepsis ([place]) 03/15/2017  . CKD (chronic kidney disease) stage 3, GFR 30-59 ml/min (HCC) 03/15/2017  . Diabetic polyneuropathy associated with type 2 diabetes mellitus ([place]) 01/23/2017  . Right foot ulcer, limited to breakdown of skin ([place]) 01/07/2017  . AKI (acute kidney injury) ([place]) 09/28/2016  . Nausea vomiting and diarrhea 09/28/2016  . Onychomycosis of multiple toenails with type 2 diabetes mellitus ([place]) 08/29/2015  . MRSA carrier 04/20/2014  . Testosterone deficiency 04/20/2014  . Penile wart 02/22/2014  . HIV disease ([place])   . Insulin-requiring or dependent type II diabetes mellitus ([place]) 02/04/2014  . Dental anomaly 11/20/2012  . Arthritis of right knee 02/23/2012    . Hyperlipidemia 11/10/2011  . Chronic pain 08/07/2011  . Meralgia paraesthetica 04/23/2011  . ERECTILE DYSFUNCTION 08/22/2008  . Essential hypertension 05/19/2008   Past Medical History:  Diagnosis Date  . Abscess of right foot   . AIDS ([place])   . Chronic knee pain    right  . Chronic pain   . Diabetes type 2, uncontrolled ([place])    HgA1c 17.6 (04/27/2010)  . Diabetic foot ulcer ([place]) 01/2017   right foot  . Erectile dysfunction   . Genital warts   . HIV (human immunodeficiency virus infection) ([place]) 2009   CD4 count 100, VL 13800 (05/01/2010)  . Hypertension   . Osteomyelitis ([place])    h/o hand  . Osteomyelitis of metatarsal ([place]) 04/28/2017    Family History  Problem Relation Age of Onset  . Hypertension Mother   . Arthritis Father   . Hypertension Father   . Hypertension Brother   . Cancer Maternal Grandmother 66       unknown type of cancer  . Depression Paternal Grandmother     Past Surgical History:  Procedure Laterality Date  . AMPUTATION Right 05/02/2017   Procedure: AMPUTATION TRANSMETARSAL;  Surgeon: [name], MD;  Location: [facility];  Service: Orthopedics;  Laterality: Right;  . AMPUTATION Right 06/13/2017   Procedure: RIGHT BELOW KNEE AMPUTATION;  Surgeon: [name], MD;  Location: [facility];  Service: Orthopedics;  Laterality: Right;  . BELOW KNEE LEG AMPUTATION Right 06/13/2017  . HERNIA REPAIR    . I&D EXTREMITY Left 08/21/2014   Procedure: INCISION AND DRAINAGE LEFT SMALL FINGER;  Surgeon: Leanora Cover, MD;  Location: [facility];  Service: Orthopedics;  Laterality: Left;  . I&D EXTREMITY Right 03/18/2017   Procedure: IRRIGATION AND DEBRIDEMENT EXTREMITY;  Surgeon: [name], MD;  Location: [facility];  Service: Orthopedics;  Laterality: Right;  . MINOR IRRIGATION AND DEBRIDEMENT OF WOUND Right 04/22/2014   Procedure: IRRIGATION AND DEBRIDEMENT OF RIGHT NECK ABCESS;  Surgeon: Jerrell Belfast, MD;  Location: [facility];  Service: ENT;  Laterality: Right;  . MULTIPLE  EXTRACTIONS WITH ALVEOLOPLASTY N/A 01/18/2013   Procedure: MULTIPLE EXTRACION 3, 6, 7, 10, 11, 13, 21, 22, 27, 28, 29, 30 WITH ALVEOLOPLASTY;  Surgeon: Gae Bon, DDS;  Location: [facility];  Service: Oral Surgery;  Laterality: N/A;  . SKIN SPLIT GRAFT Right 03/21/2017   Procedure: IRRIGATION AND DEBRIDEMENT RIGHT FOOT AND APPLY SPLIT THICKNESS SKIN GRAFT AND WOUND VAC;  Surgeon: [name], MD;  Location: [facility];  Service: Orthopedics;  Laterality: Right;  . TEE WITHOUT CARDIOVERSION N/A 05/02/2017   Procedure: TRANSESOPHAGEAL ECHOCARDIOGRAM (TEE);  Surgeon: Acie Fredrickson Wonda Cheng, MD;  Location: [facility];  Service: Cardiovascular;  Laterality: N/A;  coincidental to orthopedic case  Social History   Occupational History  . Not on file  Tobacco Use  . Smoking status: Never Smoker  . Smokeless tobacco: Never Used  Substance and Sexual Activity  . Alcohol use: No    Alcohol/week: 0.0 standard drinks  . Drug use: No  . Sexual activity: Yes    Partners: Female    Comment: given condoms

## 2018-04-28 ENCOUNTER — Ambulatory Visit (INDEPENDENT_AMBULATORY_CARE_PROVIDER_SITE_OTHER): Payer: Medicare Other | Admitting: Physician Assistant

## 2018-05-04 ENCOUNTER — Ambulatory Visit (INDEPENDENT_AMBULATORY_CARE_PROVIDER_SITE_OTHER): Payer: Medicare Other | Admitting: Physician Assistant

## 2018-05-04 ENCOUNTER — Telehealth (INDEPENDENT_AMBULATORY_CARE_PROVIDER_SITE_OTHER): Payer: Self-pay | Admitting: Physician Assistant

## 2018-05-04 NOTE — Telephone Encounter (Signed)
Patient called needing Rx refilled (Oxycodone) The number to contact patient is [phone]

## 2018-05-04 NOTE — Telephone Encounter (Signed)
Will hold this request pt has an appt tomorrow he did not keep his appt today.

## 2018-05-05 ENCOUNTER — Ambulatory Visit (INDEPENDENT_AMBULATORY_CARE_PROVIDER_SITE_OTHER): Payer: Medicare Other | Admitting: Physician Assistant

## 2018-05-05 ENCOUNTER — Encounter (INDEPENDENT_AMBULATORY_CARE_PROVIDER_SITE_OTHER): Payer: Self-pay | Admitting: Physician Assistant

## 2018-05-05 VITALS — Ht 69.0 in | Wt 280.0 lb

## 2018-05-05 DIAGNOSIS — E1142 Type 2 diabetes mellitus with diabetic polyneuropathy: Secondary | ICD-10-CM

## 2018-05-05 DIAGNOSIS — Z89511 Acquired absence of right leg below knee: Secondary | ICD-10-CM

## 2018-05-05 DIAGNOSIS — I872 Venous insufficiency (chronic) (peripheral): Secondary | ICD-10-CM

## 2018-05-05 DIAGNOSIS — L97221 Non-pressure chronic ulcer of left calf limited to breakdown of skin: Secondary | ICD-10-CM

## 2018-05-05 NOTE — Progress Notes (Signed)
Office Visit Note   Patient: Keith [name]           Date of Birth: 05-11-1965           MRN: [id number] Visit Date: 05/05/2018              Requested by: [name], [address] [place] [place], [place] [address] PCP: [name], FNP  Chief Complaint  Patient presents with  . Right Leg - Follow-up    06/13/17 Right BKA  . Left Leg - Follow-up, Wound Check      HPI: The patient is a 53 year old gentleman who is seen for follow-up of his left lower leg ulcerations.  We have continued in an Unna boot and Dynaflex and his edema is much improved.  He is continuing to have some drainage from the large area of ulceration but this is gradually improving.  He has not yet obtained his compression stockings and we discussed that he needs to obtain these in order to be able to not have a compression wrap to place every week.  He also comes in complaining of pain he reports that he quit going to his pain management clinic in [place] and we discussed that we cannot prescribe chronic pain medication from this office.  He is going to pursue finding a chronic pain doctor elsewhere.  Assessment & Plan: Visit Diagnoses:  1. Venous stasis ulcer of left calf limited to breakdown of skin without varicose veins (HCC)   2. Diabetic polyneuropathy associated with type 2 diabetes mellitus ([place])   3. Acquired absence of right leg below knee [facility])     Plan: We placed a silver alginate dressing over the large left lower leg ulcer and then Unna boot and Dynaflex compression wrap were placed.  He will follow-up here in 1 week.  He reports he is going to get his compression stocking and was given a another prescription for this.  Follow-Up Instructions: Return in about 1 week (around 05/12/2018).   Ortho Exam  Patient is alert, oriented, no adenopathy, well-dressed, normal affect, normal respiratory effort. His left lower leg edema is markedly improved.  The residual ulcer has crusting as well as  some open superficial areas with drainage.  There is no evidence of cellulitis or infection.  He has palpable pedal pulses.  Imaging: No results found. No images are attached to the encounter.  Labs: Lab Results  Component Value Date   HGBA1C 6.4 (A) 01/06/2018   HGBA1C 6.4 (A) 11/04/2017   HGBA1C 5.4 08/27/2017   ESRSEDRATE 140 (H) 04/28/2017   ESRSEDRATE 127 (H) 03/15/2017   ESRSEDRATE 94 (H) 01/07/2017   CRP 14.9 (H) 04/28/2017   CRP 23.1 (H) 03/15/2017   CRP 1.8 (H) 01/08/2017   LABURIC 6.3 08/07/2009   REPTSTATUS 05/08/2017 FINAL 05/03/2017   GRAMSTAIN  04/29/2017    NO WBC SEEN RARE GRAM POSITIVE COCCI IN PAIRS IN SINGLES    CULT NO GROWTH 5 DAYS 05/03/2017   LABORGA METHICILLIN RESISTANT STAPHYLOCOCCUS AUREUS 04/29/2017     Lab Results  Component Value Date   ALBUMIN 3.6 08/19/2017   ALBUMIN 3.0 (L) 04/28/2017   ALBUMIN 3.0 (L) 01/15/2017   PREALBUMIN 7.4 (L) 03/15/2017   LABURIC 6.3 08/07/2009    Body mass index is 41.35 kg/m.  Orders:  No orders of the defined types were placed in this encounter.  No orders of the defined types were placed in this encounter.    Procedures: No procedures  performed  Clinical Data: No additional findings.  ROS:  All other systems negative, except as noted in the HPI. Review of Systems  Objective: Vital Signs: Ht 5\' 9"  (1.753 m)   Wt 280 lb (127 kg)   BMI 41.35 kg/m   Specialty Comments:  No specialty comments available.  PMFS History: Patient Active Problem List   Diagnosis Date Noted  . Venous stasis ulcer of left calf limited to breakdown of skin without varicose veins ([place]) 04/23/2018  . Below knee amputation status, right 06/13/2017  . S/P transmetatarsal amputation of foot, right ([place]) 05/07/2017  . Wound infection   . Bacteremia   . Fever   . Sepsis ([place]) 03/15/2017  . CKD (chronic kidney disease) stage 3, GFR 30-59 ml/min (HCC) 03/15/2017  . Diabetic polyneuropathy associated with type 2  diabetes mellitus ([place]) 01/23/2017  . AKI (acute kidney injury) ([place]) 09/28/2016  . Nausea vomiting and diarrhea 09/28/2016  . Onychomycosis of multiple toenails with type 2 diabetes mellitus ([place]) 08/29/2015  . MRSA carrier 04/20/2014  . Testosterone deficiency 04/20/2014  . Penile wart 02/22/2014  . HIV disease ([place])   . Insulin-requiring or dependent type II diabetes mellitus ([place]) 02/04/2014  . Dental anomaly 11/20/2012  . Arthritis of right knee 02/23/2012  . Hyperlipidemia 11/10/2011  . Chronic pain 08/07/2011  . Meralgia paraesthetica 04/23/2011  . ERECTILE DYSFUNCTION 08/22/2008  . Essential hypertension 05/19/2008   Past Medical History:  Diagnosis Date  . Abscess of right foot   . AIDS ([place])   . Chronic knee pain    right  . Chronic pain   . Diabetes type 2, uncontrolled ([place])    HgA1c 17.6 (04/27/2010)  . Diabetic foot ulcer ([place]) 01/2017   right foot  . Erectile dysfunction   . Genital warts   . HIV (human immunodeficiency virus infection) ([place]) 2009   CD4 count 100, VL 13800 (05/01/2010)  . Hypertension   . Osteomyelitis ([place])    h/o hand  . Osteomyelitis of metatarsal ([place]) 04/28/2017    Family History  Problem Relation Age of Onset  . Hypertension Mother   . Arthritis Father   . Hypertension Father   . Hypertension Brother   . Cancer Maternal Grandmother 43       unknown type of cancer  . Depression Paternal Grandmother     Past Surgical History:  Procedure Laterality Date  . AMPUTATION Right 05/02/2017   Procedure: AMPUTATION TRANSMETARSAL;  Surgeon: [name], MD;  Location: [facility];  Service: Orthopedics;  Laterality: Right;  . AMPUTATION Right 06/13/2017   Procedure: RIGHT BELOW KNEE AMPUTATION;  Surgeon: [name], MD;  Location: [facility];  Service: Orthopedics;  Laterality: Right;  . BELOW KNEE LEG AMPUTATION Right 06/13/2017  . HERNIA REPAIR    . I&D EXTREMITY Left 08/21/2014   Procedure: INCISION AND DRAINAGE LEFT SMALL FINGER;  Surgeon:  Leanora Cover, MD;  Location: [facility];  Service: Orthopedics;  Laterality: Left;  . I&D EXTREMITY Right 03/18/2017   Procedure: IRRIGATION AND DEBRIDEMENT EXTREMITY;  Surgeon: [name], MD;  Location: [facility];  Service: Orthopedics;  Laterality: Right;  . MINOR IRRIGATION AND DEBRIDEMENT OF WOUND Right 04/22/2014   Procedure: IRRIGATION AND DEBRIDEMENT OF RIGHT NECK ABCESS;  Surgeon: Jerrell Belfast, MD;  Location: [facility];  Service: ENT;  Laterality: Right;  . MULTIPLE EXTRACTIONS WITH ALVEOLOPLASTY N/A 01/18/2013   Procedure: MULTIPLE EXTRACION 3, 6, 7, 10, 11, 13, 21, 22, 27, 28, 29, 30 WITH ALVEOLOPLASTY;  Surgeon: Gae Bon, DDS;  Location: [facility];  Service: Oral Surgery;  Laterality: N/A;  . SKIN SPLIT GRAFT Right 03/21/2017   Procedure: IRRIGATION AND DEBRIDEMENT RIGHT FOOT AND APPLY SPLIT THICKNESS SKIN GRAFT AND WOUND VAC;  Surgeon: [name], MD;  Location: [facility];  Service: Orthopedics;  Laterality: Right;  . TEE WITHOUT CARDIOVERSION N/A 05/02/2017   Procedure: TRANSESOPHAGEAL ECHOCARDIOGRAM (TEE);  Surgeon: Acie Fredrickson Wonda Cheng, MD;  Location: [facility] OR;  Service: Cardiovascular;  Laterality: N/A;  coincidental to orthopedic case   Social History   Occupational History  . Not on file  Tobacco Use  . Smoking status: Never Smoker  . Smokeless tobacco: Never Used  Substance and Sexual Activity  . Alcohol use: No    Alcohol/week: 0.0 standard drinks  . Drug use: No  . Sexual activity: Yes    Partners: Female    Comment: given condoms

## 2018-05-12 ENCOUNTER — Ambulatory Visit (INDEPENDENT_AMBULATORY_CARE_PROVIDER_SITE_OTHER): Payer: Medicare Other | Admitting: Physician Assistant

## 2018-05-19 ENCOUNTER — Encounter (INDEPENDENT_AMBULATORY_CARE_PROVIDER_SITE_OTHER): Payer: Self-pay | Admitting: Physician Assistant

## 2018-05-19 ENCOUNTER — Ambulatory Visit (INDEPENDENT_AMBULATORY_CARE_PROVIDER_SITE_OTHER): Payer: Medicare Other | Admitting: Physician Assistant

## 2018-05-19 DIAGNOSIS — I872 Venous insufficiency (chronic) (peripheral): Secondary | ICD-10-CM

## 2018-05-19 DIAGNOSIS — E1142 Type 2 diabetes mellitus with diabetic polyneuropathy: Secondary | ICD-10-CM

## 2018-05-19 DIAGNOSIS — L97221 Non-pressure chronic ulcer of left calf limited to breakdown of skin: Secondary | ICD-10-CM | POA: Diagnosis not present

## 2018-05-19 DIAGNOSIS — Z89511 Acquired absence of right leg below knee: Secondary | ICD-10-CM | POA: Diagnosis not present

## 2018-05-19 MED ORDER — DOXYCYCLINE HYCLATE 100 MG PO CAPS: 100.0000 mg | ORAL_CAPSULE | Freq: Two times a day (BID) | ORAL | 1 refills | Status: DC

## 2018-05-19 NOTE — Progress Notes (Signed)
Office Visit Note   Patient: Keith [name]           Date of Birth: 01-26-1966           MRN: [id number] Visit Date: 05/19/2018              Requested by: [name], [address] [place], [place] [address] PCP: [name], FNP  No chief complaint on file.     HPI: The patient is a 52 year old gentleman who is seen for follow-up of his left lower leg ulcerations.  He missed his appointment last week despite having an Haematologist and Dynaflex in place.  We discussed that it is very important for him to follow-up and we put compression layer wrapping on him.  He is complaining of pain over the lower extremities and we discussed that we could increase his gabapentin but he reports that this does not help.  He apparently quit going to his pain clinic in [place] and we again reviewed that we are not able to prescribe chronic pain medication from this office.  Assessment & Plan: Visit Diagnoses:  1. Venous stasis ulcer of left calf limited to breakdown of skin without varicose veins (HCC)   2. Diabetic polyneuropathy associated with type 2 diabetes mellitus ([place])   3. Acquired absence of right leg below knee (HCC)     Plan: The eschar over the left lower extremity ulcers were debrided with a #10 blade knife and silver nitrate was used for hemostasis.  The patient tolerated this well and pleuro-gel was placed over the wound bed covered with Adaptic gauze and Ace wrapping for edema control.  Will start doxycycline 100 mg p.o. twice daily.  I discussed with the patient that he can shower prior to his next office visit planned for Friday and he can get the wound wet with soap and water and then apply dry gauze over the area and come in for his office visit.  Follow-Up Instructions: Return in about 3 days (around 05/22/2018).   Ortho Exam  Patient is alert, oriented, no adenopathy, well-dressed, normal affect, normal respiratory effort. There is dark eschar present in  the wound beds and this was sharply debrided with a #10 blade knife and the patient tolerated this well.  There is beefy bleeding red tissue underneath the eschar.  There is some fatty necrosis noted.  No purulent drainage but there is odor.  He has palpable pedal pulses.    Imaging: No results found. No images are attached to the encounter.  Labs: Lab Results  Component Value Date   HGBA1C 6.4 (A) 01/06/2018   HGBA1C 6.4 (A) 11/04/2017   HGBA1C 5.4 08/27/2017   ESRSEDRATE 140 (H) 04/28/2017   ESRSEDRATE 127 (H) 03/15/2017   ESRSEDRATE 94 (H) 01/07/2017   CRP 14.9 (H) 04/28/2017   CRP 23.1 (H) 03/15/2017   CRP 1.8 (H) 01/08/2017   LABURIC 6.3 08/07/2009   REPTSTATUS 05/08/2017 FINAL 05/03/2017   GRAMSTAIN  04/29/2017    NO WBC SEEN RARE GRAM POSITIVE COCCI IN PAIRS IN SINGLES    CULT NO GROWTH 5 DAYS 05/03/2017   LABORGA METHICILLIN RESISTANT STAPHYLOCOCCUS AUREUS 04/29/2017     Lab Results  Component Value Date   ALBUMIN 3.6 08/19/2017   ALBUMIN 3.0 (L) 04/28/2017   ALBUMIN 3.0 (L) 01/15/2017   PREALBUMIN 7.4 (L) 03/15/2017   LABURIC 6.3 08/07/2009    There is no height or weight on file to calculate BMI.  Orders:  No orders of the defined types were placed in this encounter.  No orders of the defined types were placed in this encounter.    Procedures: No procedures performed  Clinical Data: No additional findings.  ROS:  All other systems negative, except as noted in the HPI. Review of Systems  Objective: Vital Signs: There were no vitals taken for this visit.  Specialty Comments:  No specialty comments available.  PMFS History: Patient Active Problem List   Diagnosis Date Noted  . Venous stasis ulcer of left calf limited to breakdown of skin without varicose veins ([place]) 04/23/2018  . Below knee amputation status, right 06/13/2017  . S/P transmetatarsal amputation of foot, right ([place]) 05/07/2017  . Wound infection   . Bacteremia   . Fever     . Sepsis ([place]) 03/15/2017  . CKD (chronic kidney disease) stage 3, GFR 30-59 ml/min (HCC) 03/15/2017  . Diabetic polyneuropathy associated with type 2 diabetes mellitus ([place]) 01/23/2017  . AKI (acute kidney injury) ([place]) 09/28/2016  . Nausea vomiting and diarrhea 09/28/2016  . Onychomycosis of multiple toenails with type 2 diabetes mellitus ([place]) 08/29/2015  . MRSA carrier 04/20/2014  . Testosterone deficiency 04/20/2014  . Penile wart 02/22/2014  . HIV disease ([place])   . Insulin-requiring or dependent type II diabetes mellitus ([place]) 02/04/2014  . Dental anomaly 11/20/2012  . Arthritis of right knee 02/23/2012  . Hyperlipidemia 11/10/2011  . Chronic pain 08/07/2011  . Meralgia paraesthetica 04/23/2011  . ERECTILE DYSFUNCTION 08/22/2008  . Essential hypertension 05/19/2008   Past Medical History:  Diagnosis Date  . Abscess of right foot   . AIDS ([place])   . Chronic knee pain    right  . Chronic pain   . Diabetes type 2, uncontrolled ([place])    HgA1c 17.6 (04/27/2010)  . Diabetic foot ulcer ([place]) 01/2017   right foot  . Erectile dysfunction   . Genital warts   . HIV (human immunodeficiency virus infection) ([place]) 2009   CD4 count 100, VL 13800 (05/01/2010)  . Hypertension   . Osteomyelitis ([place])    h/o hand  . Osteomyelitis of metatarsal ([place]) 04/28/2017    Family History  Problem Relation Age of Onset  . Hypertension Mother   . Arthritis Father   . Hypertension Father   . Hypertension Brother   . Cancer Maternal Grandmother 58       unknown type of cancer  . Depression Paternal Grandmother     Past Surgical History:  Procedure Laterality Date  . AMPUTATION Right 05/02/2017   Procedure: AMPUTATION TRANSMETARSAL;  Surgeon: [name], MD;  Location: [facility];  Service: Orthopedics;  Laterality: Right;  . AMPUTATION Right 06/13/2017   Procedure: RIGHT BELOW KNEE AMPUTATION;  Surgeon: [name], MD;  Location: [facility];  Service: Orthopedics;  Laterality: Right;  . BELOW  KNEE LEG AMPUTATION Right 06/13/2017  . HERNIA REPAIR    . I&D EXTREMITY Left 08/21/2014   Procedure: INCISION AND DRAINAGE LEFT SMALL FINGER;  Surgeon: Leanora Cover, MD;  Location: [facility];  Service: Orthopedics;  Laterality: Left;  . I&D EXTREMITY Right 03/18/2017   Procedure: IRRIGATION AND DEBRIDEMENT EXTREMITY;  Surgeon: [name], MD;  Location: [facility];  Service: Orthopedics;  Laterality: Right;  . MINOR IRRIGATION AND DEBRIDEMENT OF WOUND Right 04/22/2014   Procedure: IRRIGATION AND DEBRIDEMENT OF RIGHT NECK ABCESS;  Surgeon: Jerrell Belfast, MD;  Location: [facility];  Service: ENT;  Laterality: Right;  . MULTIPLE EXTRACTIONS WITH ALVEOLOPLASTY N/A 01/18/2013  Procedure: MULTIPLE EXTRACION 3, 6, 7, 10, 11, 13, 21, 22, 27, 28, 29, 30 WITH ALVEOLOPLASTY;  Surgeon: Gae Bon, DDS;  Location: [facility];  Service: Oral Surgery;  Laterality: N/A;  . SKIN SPLIT GRAFT Right 03/21/2017   Procedure: IRRIGATION AND DEBRIDEMENT RIGHT FOOT AND APPLY SPLIT THICKNESS SKIN GRAFT AND WOUND VAC;  Surgeon: [name], MD;  Location: [facility];  Service: Orthopedics;  Laterality: Right;  . TEE WITHOUT CARDIOVERSION N/A 05/02/2017   Procedure: TRANSESOPHAGEAL ECHOCARDIOGRAM (TEE);  Surgeon: Acie Fredrickson Wonda Cheng, MD;  Location: [facility] OR;  Service: Cardiovascular;  Laterality: N/A;  coincidental to orthopedic case   Social History   Occupational History  . Not on file  Tobacco Use  . Smoking status: Never Smoker  . Smokeless tobacco: Never Used  Substance and Sexual Activity  . Alcohol use: No    Alcohol/week: 0.0 standard drinks  . Drug use: No  . Sexual activity: Yes    Partners: Female    Comment: given condoms

## 2018-05-22 ENCOUNTER — Encounter (INDEPENDENT_AMBULATORY_CARE_PROVIDER_SITE_OTHER): Payer: Self-pay | Admitting: Physician Assistant

## 2018-05-22 ENCOUNTER — Ambulatory Visit (INDEPENDENT_AMBULATORY_CARE_PROVIDER_SITE_OTHER): Payer: Medicare Other | Admitting: Physician Assistant

## 2018-05-22 VITALS — Ht 69.0 in | Wt 280.0 lb

## 2018-05-22 DIAGNOSIS — E1142 Type 2 diabetes mellitus with diabetic polyneuropathy: Secondary | ICD-10-CM

## 2018-05-22 DIAGNOSIS — Z89511 Acquired absence of right leg below knee: Secondary | ICD-10-CM

## 2018-05-22 DIAGNOSIS — L97221 Non-pressure chronic ulcer of left calf limited to breakdown of skin: Secondary | ICD-10-CM | POA: Diagnosis not present

## 2018-05-22 DIAGNOSIS — I872 Venous insufficiency (chronic) (peripheral): Secondary | ICD-10-CM

## 2018-05-22 NOTE — Progress Notes (Signed)
Office Visit Note   Patient: Keith [name]           Date of Birth: Nov 12, 1965           MRN: [id number] Visit Date: 05/22/2018              Requested by: [name], [place], [place] [address] PCP: [name], FNP  Chief Complaint  Patient presents with  . Left Leg - Follow-up, Pain, Wound Check      HPI: The patient is a 53 year old gentleman who is seen for follow-up of his left lower leg ulcerations.  They were debrided in the office earlier this week and pleuro-gel was placed over the wound bed.  He comes in for post debridement follow-up.  He is instructed to start on doxycycline 100 mg twice daily as instructed.  Assessment & Plan: Visit Diagnoses:  1. Venous stasis ulcer of left calf limited to breakdown of skin without varicose veins (HCC)   2. Diabetic polyneuropathy associated with type 2 diabetes mellitus ([place])   3. Acquired absence of right leg below knee (HCC)     Plan: Started Manuka honey dressing to the left lower extremity wound.  He will need weekly application of Manuka honey.  [facility] to see the patient twice weekly for monitoring of the wound and for application of the Manuka honey as discussed with [name].  Continue ABD pad and Ace wrapping for edema control.  He may shower prior to the next [place] honey application.  He will follow-up here in 2 weeks.  Follow-Up Instructions: Return in about 2 weeks (around 06/05/2018).   Ortho Exam  Patient is alert, oriented, no adenopathy, well-dressed, normal affect, normal respiratory effort. The left lower leg wound beds are improved with with the majority now having good granulation.  There is still some dark adherent eschar which was sharply debrided with good bleeding tissue underneath.  Hemostasis was achieved with silver nitrate.  There is no periwound irritation.  No signs of cellulitis or infection.  There is some continued odor.  There is  minimal to moderate edema.  Imaging: No results found. No images are attached to the encounter.  Labs: Lab Results  Component Value Date   HGBA1C 6.4 (A) 01/06/2018   HGBA1C 6.4 (A) 11/04/2017   HGBA1C 5.4 08/27/2017   ESRSEDRATE 140 (H) 04/28/2017   ESRSEDRATE 127 (H) 03/15/2017   ESRSEDRATE 94 (H) 01/07/2017   CRP 14.9 (H) 04/28/2017   CRP 23.1 (H) 03/15/2017   CRP 1.8 (H) 01/08/2017   LABURIC 6.3 08/07/2009   REPTSTATUS 05/08/2017 FINAL 05/03/2017   GRAMSTAIN  04/29/2017    NO WBC SEEN RARE GRAM POSITIVE COCCI IN PAIRS IN SINGLES    CULT NO GROWTH 5 DAYS 05/03/2017   LABORGA METHICILLIN RESISTANT STAPHYLOCOCCUS AUREUS 04/29/2017     Lab Results  Component Value Date   ALBUMIN 3.6 08/19/2017   ALBUMIN 3.0 (L) 04/28/2017   ALBUMIN 3.0 (L) 01/15/2017   PREALBUMIN 7.4 (L) 03/15/2017   LABURIC 6.3 08/07/2009    Body mass index is 41.35 kg/m.  Orders:  No orders of the defined types were placed in this encounter.  No orders of the defined types were placed in this encounter.    Procedures: No procedures performed  Clinical Data: No additional findings.  ROS:  All other systems negative, except as noted in the HPI. Review of Systems  Objective: Vital Signs: Ht  5\' 9"  (1.753 m)   Wt 280 lb (127 kg)   BMI 41.35 kg/m   Specialty Comments:  No specialty comments available.  PMFS History: Patient Active Problem List   Diagnosis Date Noted  . Venous stasis ulcer of left calf limited to breakdown of skin without varicose veins ([place]) 04/23/2018  . Below knee amputation status, right 06/13/2017  . S/P transmetatarsal amputation of foot, right ([place]) 05/07/2017  . Wound infection   . Bacteremia   . Fever   . Sepsis ([place]) 03/15/2017  . CKD (chronic kidney disease) stage 3, GFR 30-59 ml/min (HCC) 03/15/2017  . Diabetic polyneuropathy associated with type 2 diabetes mellitus ([place]) 01/23/2017  . AKI (acute kidney injury) ([place]) 09/28/2016  . Nausea vomiting  and diarrhea 09/28/2016  . Onychomycosis of multiple toenails with type 2 diabetes mellitus ([place]) 08/29/2015  . MRSA carrier 04/20/2014  . Testosterone deficiency 04/20/2014  . Penile wart 02/22/2014  . HIV disease ([place])   . Insulin-requiring or dependent type II diabetes mellitus ([place]) 02/04/2014  . Dental anomaly 11/20/2012  . Arthritis of right knee 02/23/2012  . Hyperlipidemia 11/10/2011  . Chronic pain 08/07/2011  . Meralgia paraesthetica 04/23/2011  . ERECTILE DYSFUNCTION 08/22/2008  . Essential hypertension 05/19/2008   Past Medical History:  Diagnosis Date  . Abscess of right foot   . AIDS ([place])   . Chronic knee pain    right  . Chronic pain   . Diabetes type 2, uncontrolled ([place])    HgA1c 17.6 (04/27/2010)  . Diabetic foot ulcer ([place]) 01/2017   right foot  . Erectile dysfunction   . Genital warts   . HIV (human immunodeficiency virus infection) ([place]) 2009   CD4 count 100, VL 13800 (05/01/2010)  . Hypertension   . Osteomyelitis ([place])    h/o hand  . Osteomyelitis of metatarsal ([place]) 04/28/2017    Family History  Problem Relation Age of Onset  . Hypertension Mother   . Arthritis Father   . Hypertension Father   . Hypertension Brother   . Cancer Maternal Grandmother 48       unknown type of cancer  . Depression Paternal Grandmother     Past Surgical History:  Procedure Laterality Date  . AMPUTATION Right 05/02/2017   Procedure: AMPUTATION TRANSMETARSAL;  Surgeon: [name], MD;  Location: [facility];  Service: Orthopedics;  Laterality: Right;  . AMPUTATION Right 06/13/2017   Procedure: RIGHT BELOW KNEE AMPUTATION;  Surgeon: [name], MD;  Location: [facility];  Service: Orthopedics;  Laterality: Right;  . BELOW KNEE LEG AMPUTATION Right 06/13/2017  . HERNIA REPAIR    . I&D EXTREMITY Left 08/21/2014   Procedure: INCISION AND DRAINAGE LEFT SMALL FINGER;  Surgeon: Leanora Cover, MD;  Location: [facility];  Service: Orthopedics;  Laterality: Left;  . I&D EXTREMITY Right  03/18/2017   Procedure: IRRIGATION AND DEBRIDEMENT EXTREMITY;  Surgeon: [name], MD;  Location: [facility];  Service: Orthopedics;  Laterality: Right;  . MINOR IRRIGATION AND DEBRIDEMENT OF WOUND Right 04/22/2014   Procedure: IRRIGATION AND DEBRIDEMENT OF RIGHT NECK ABCESS;  Surgeon: Jerrell Belfast, MD;  Location: [facility];  Service: ENT;  Laterality: Right;  . MULTIPLE EXTRACTIONS WITH ALVEOLOPLASTY N/A 01/18/2013   Procedure: MULTIPLE EXTRACION 3, 6, 7, 10, 11, 13, 21, 22, 27, 28, 29, 30 WITH ALVEOLOPLASTY;  Surgeon: Gae Bon, DDS;  Location: [facility];  Service: Oral Surgery;  Laterality: N/A;  . SKIN SPLIT GRAFT Right 03/21/2017   Procedure: IRRIGATION AND  DEBRIDEMENT RIGHT FOOT AND APPLY SPLIT THICKNESS SKIN GRAFT AND WOUND VAC;  Surgeon: [name], MD;  Location: [facility];  Service: Orthopedics;  Laterality: Right;  . TEE WITHOUT CARDIOVERSION N/A 05/02/2017   Procedure: TRANSESOPHAGEAL ECHOCARDIOGRAM (TEE);  Surgeon: Acie Fredrickson Wonda Cheng, MD;  Location[facility];  Service: Cardiovascular;  Laterality: N/A;  coincidental to orthopedic case   Social History   Occupational History  . Not on file  Tobacco Use  . Smoking status: Never Smoker  . Smokeless tobacco: Never Used  Substance and Sexual Activity  . Alcohol use: No    Alcohol/week: 0.0 standard drinks  . Drug use: No  . Sexual activity: Yes    Partners: Female    Comment: given condoms

## 2018-05-25 ENCOUNTER — Telehealth (INDEPENDENT_AMBULATORY_CARE_PROVIDER_SITE_OTHER): Payer: Self-pay | Admitting: Physician Assistant

## 2018-05-25 NOTE — Telephone Encounter (Signed)
Dorian Pod with [facility] called advised due to unable to reach patient there is a delay in start of care. The number to contact Dorian Pod is [phone]

## 2018-05-25 NOTE — Telephone Encounter (Signed)
Pt phone is not working at this time. Will hold until we can see him on 2/26 or until contacted

## 2018-05-27 ENCOUNTER — Telehealth (INDEPENDENT_AMBULATORY_CARE_PROVIDER_SITE_OTHER): Payer: Self-pay

## 2018-05-27 DIAGNOSIS — I10 Essential (primary) hypertension: Secondary | ICD-10-CM | POA: Diagnosis not present

## 2018-05-27 DIAGNOSIS — N183 Chronic kidney disease, stage 3 (moderate): Secondary | ICD-10-CM | POA: Diagnosis not present

## 2018-05-27 DIAGNOSIS — Z21 Asymptomatic human immunodeficiency virus [HIV] infection status: Secondary | ICD-10-CM | POA: Diagnosis not present

## 2018-05-27 DIAGNOSIS — Z794 Long term (current) use of insulin: Secondary | ICD-10-CM | POA: Diagnosis not present

## 2018-05-27 DIAGNOSIS — E1122 Type 2 diabetes mellitus with diabetic chronic kidney disease: Secondary | ICD-10-CM | POA: Diagnosis not present

## 2018-05-27 DIAGNOSIS — Z89511 Acquired absence of right leg below knee: Secondary | ICD-10-CM | POA: Diagnosis not present

## 2018-05-27 DIAGNOSIS — E669 Obesity, unspecified: Secondary | ICD-10-CM | POA: Diagnosis not present

## 2018-05-27 DIAGNOSIS — E1142 Type 2 diabetes mellitus with diabetic polyneuropathy: Secondary | ICD-10-CM | POA: Diagnosis not present

## 2018-05-27 DIAGNOSIS — L97222 Non-pressure chronic ulcer of left calf with fat layer exposed: Secondary | ICD-10-CM | POA: Diagnosis not present

## 2018-05-27 DIAGNOSIS — Z6841 Body Mass Index (BMI) 40.0 and over, adult: Secondary | ICD-10-CM | POA: Diagnosis not present

## 2018-05-27 DIAGNOSIS — E1151 Type 2 diabetes mellitus with diabetic peripheral angiopathy without gangrene: Secondary | ICD-10-CM | POA: Diagnosis not present

## 2018-05-27 DIAGNOSIS — Z4800 Encounter for change or removal of nonsurgical wound dressing: Secondary | ICD-10-CM | POA: Diagnosis not present

## 2018-05-27 DIAGNOSIS — I872 Venous insufficiency (chronic) (peripheral): Secondary | ICD-10-CM | POA: Diagnosis not present

## 2018-05-27 NOTE — Telephone Encounter (Signed)
I called [facility] and informed Dorian Pod that if she doesn't have [place] than she can use silver aliginate until the order comes in. Dorian Pod understood orders and will be going out to pt's home tomorrow to do his drsg change.

## 2018-05-27 NOTE — Telephone Encounter (Signed)
Dorian Pod with [facility] called stating that they received the orders and that [facility] dressing has to be ordered and it should arrive by Friday, 05/29/2018.  Would like to know if something else should be used?  Stated that she would keep dressing on until told otherwise.  Cb# is [phone].  Please advise.  Thank you.

## 2018-05-27 NOTE — Telephone Encounter (Signed)
I called [facility] and informed Dorian Pod that if she doesn't have [place] than she can use silver aliginate until the order comes in. Dorian Pod understood orders and will be going out to pt's home tomorrow to do his drsg change.

## 2018-05-28 DIAGNOSIS — Z4800 Encounter for change or removal of nonsurgical wound dressing: Secondary | ICD-10-CM | POA: Diagnosis not present

## 2018-05-28 DIAGNOSIS — E1142 Type 2 diabetes mellitus with diabetic polyneuropathy: Secondary | ICD-10-CM | POA: Diagnosis not present

## 2018-05-28 DIAGNOSIS — Z794 Long term (current) use of insulin: Secondary | ICD-10-CM | POA: Diagnosis not present

## 2018-05-28 DIAGNOSIS — I872 Venous insufficiency (chronic) (peripheral): Secondary | ICD-10-CM | POA: Diagnosis not present

## 2018-05-28 DIAGNOSIS — E1151 Type 2 diabetes mellitus with diabetic peripheral angiopathy without gangrene: Secondary | ICD-10-CM | POA: Diagnosis not present

## 2018-05-28 DIAGNOSIS — L97222 Non-pressure chronic ulcer of left calf with fat layer exposed: Secondary | ICD-10-CM | POA: Diagnosis not present

## 2018-06-01 DIAGNOSIS — I872 Venous insufficiency (chronic) (peripheral): Secondary | ICD-10-CM | POA: Diagnosis not present

## 2018-06-01 DIAGNOSIS — Z794 Long term (current) use of insulin: Secondary | ICD-10-CM | POA: Diagnosis not present

## 2018-06-01 DIAGNOSIS — E1151 Type 2 diabetes mellitus with diabetic peripheral angiopathy without gangrene: Secondary | ICD-10-CM | POA: Diagnosis not present

## 2018-06-01 DIAGNOSIS — L97222 Non-pressure chronic ulcer of left calf with fat layer exposed: Secondary | ICD-10-CM | POA: Diagnosis not present

## 2018-06-01 DIAGNOSIS — E1142 Type 2 diabetes mellitus with diabetic polyneuropathy: Secondary | ICD-10-CM | POA: Diagnosis not present

## 2018-06-01 DIAGNOSIS — Z4800 Encounter for change or removal of nonsurgical wound dressing: Secondary | ICD-10-CM | POA: Diagnosis not present

## 2018-06-05 ENCOUNTER — Encounter (HOSPITAL_COMMUNITY): Payer: Self-pay | Admitting: Emergency Medicine

## 2018-06-05 ENCOUNTER — Ambulatory Visit (INDEPENDENT_AMBULATORY_CARE_PROVIDER_SITE_OTHER): Payer: Medicare Other | Admitting: Physician Assistant

## 2018-06-05 ENCOUNTER — Inpatient Hospital Stay (HOSPITAL_COMMUNITY)
Admission: EM | Admit: 2018-06-05 | Discharge: 2018-06-08 | DRG: 977 | Disposition: A | Discharge status: Home / Self Care | Payer: Medicare Other | Attending: Family Medicine | Admitting: Family Medicine

## 2018-06-05 DIAGNOSIS — N529 Male erectile dysfunction, unspecified: Secondary | ICD-10-CM | POA: Diagnosis present

## 2018-06-05 DIAGNOSIS — N179 Acute kidney failure, unspecified: Secondary | ICD-10-CM | POA: Diagnosis present

## 2018-06-05 DIAGNOSIS — Z79891 Long term (current) use of opiate analgesic: Secondary | ICD-10-CM

## 2018-06-05 DIAGNOSIS — E785 Hyperlipidemia, unspecified: Secondary | ICD-10-CM | POA: Diagnosis present

## 2018-06-05 DIAGNOSIS — L089 Local infection of the skin and subcutaneous tissue, unspecified: Secondary | ICD-10-CM

## 2018-06-05 DIAGNOSIS — Z79899 Other long term (current) drug therapy: Secondary | ICD-10-CM

## 2018-06-05 DIAGNOSIS — N183 Chronic kidney disease, stage 3 unspecified: Secondary | ICD-10-CM | POA: Diagnosis present

## 2018-06-05 DIAGNOSIS — Z8261 Family history of arthritis: Secondary | ICD-10-CM

## 2018-06-05 DIAGNOSIS — E119 Type 2 diabetes mellitus without complications: Secondary | ICD-10-CM

## 2018-06-05 DIAGNOSIS — T148XXA Other injury of unspecified body region, initial encounter: Secondary | ICD-10-CM

## 2018-06-05 DIAGNOSIS — E1122 Type 2 diabetes mellitus with diabetic chronic kidney disease: Secondary | ICD-10-CM | POA: Diagnosis present

## 2018-06-05 DIAGNOSIS — Z89511 Acquired absence of right leg below knee: Secondary | ICD-10-CM

## 2018-06-05 DIAGNOSIS — L97929 Non-pressure chronic ulcer of unspecified part of left lower leg with unspecified severity: Secondary | ICD-10-CM

## 2018-06-05 DIAGNOSIS — I87332 Chronic venous hypertension (idiopathic) with ulcer and inflammation of left lower extremity: Secondary | ICD-10-CM | POA: Diagnosis not present

## 2018-06-05 DIAGNOSIS — B20 Human immunodeficiency virus [HIV] disease: Principal | ICD-10-CM | POA: Diagnosis present

## 2018-06-05 DIAGNOSIS — L84 Corns and callosities: Secondary | ICD-10-CM | POA: Diagnosis present

## 2018-06-05 DIAGNOSIS — L97229 Non-pressure chronic ulcer of left calf with unspecified severity: Secondary | ICD-10-CM | POA: Diagnosis present

## 2018-06-05 DIAGNOSIS — I10 Essential (primary) hypertension: Secondary | ICD-10-CM | POA: Diagnosis present

## 2018-06-05 DIAGNOSIS — E1165 Type 2 diabetes mellitus with hyperglycemia: Secondary | ICD-10-CM | POA: Diagnosis present

## 2018-06-05 DIAGNOSIS — Z8249 Family history of ischemic heart disease and other diseases of the circulatory system: Secondary | ICD-10-CM

## 2018-06-05 DIAGNOSIS — Z882 Allergy status to sulfonamides status: Secondary | ICD-10-CM

## 2018-06-05 DIAGNOSIS — N189 Chronic kidney disease, unspecified: Secondary | ICD-10-CM | POA: Diagnosis present

## 2018-06-05 DIAGNOSIS — Z6839 Body mass index (BMI) 39.0-39.9, adult: Secondary | ICD-10-CM

## 2018-06-05 DIAGNOSIS — E1142 Type 2 diabetes mellitus with diabetic polyneuropathy: Secondary | ICD-10-CM | POA: Diagnosis present

## 2018-06-05 DIAGNOSIS — E669 Obesity, unspecified: Secondary | ICD-10-CM | POA: Diagnosis present

## 2018-06-05 DIAGNOSIS — Z794 Long term (current) use of insulin: Secondary | ICD-10-CM

## 2018-06-05 DIAGNOSIS — I129 Hypertensive chronic kidney disease with stage 1 through stage 4 chronic kidney disease, or unspecified chronic kidney disease: Secondary | ICD-10-CM | POA: Diagnosis present

## 2018-06-05 DIAGNOSIS — Z22322 Carrier or suspected carrier of Methicillin resistant Staphylococcus aureus: Secondary | ICD-10-CM

## 2018-06-05 DIAGNOSIS — S81802A Unspecified open wound, left lower leg, initial encounter: Secondary | ICD-10-CM | POA: Diagnosis not present

## 2018-06-05 DIAGNOSIS — N184 Chronic kidney disease, stage 4 (severe): Secondary | ICD-10-CM | POA: Diagnosis present

## 2018-06-05 DIAGNOSIS — E1169 Type 2 diabetes mellitus with other specified complication: Secondary | ICD-10-CM | POA: Diagnosis present

## 2018-06-05 DIAGNOSIS — Z818 Family history of other mental and behavioral disorders: Secondary | ICD-10-CM

## 2018-06-05 DIAGNOSIS — G8929 Other chronic pain: Secondary | ICD-10-CM | POA: Diagnosis present

## 2018-06-05 LAB — CBC WITH DIFFERENTIAL/PLATELET
Abs Immature Granulocytes: 0.05 10*3/uL (ref 0.00–0.07)
Basophils Absolute: 0 10*3/uL (ref 0.0–0.1)
Basophils Relative: 0 %
Eosinophils Absolute: 0.1 10*3/uL (ref 0.0–0.5)
Eosinophils Relative: 2 %
HCT: 38.7 % — ABNORMAL LOW (ref 39.0–52.0)
Hemoglobin: 11.9 g/dL — ABNORMAL LOW (ref 13.0–17.0)
Immature Granulocytes: 1 %
Lymphocytes Relative: 36 %
Lymphs Abs: 2 10*3/uL (ref 0.7–4.0)
MCH: 26.2 pg (ref 26.0–34.0)
MCHC: 30.7 g/dL (ref 30.0–36.0)
MCV: 85.2 fL (ref 80.0–100.0)
Monocytes Absolute: 0.5 10*3/uL (ref 0.1–1.0)
Monocytes Relative: 8 %
Neutro Abs: 3 10*3/uL (ref 1.7–7.7)
Neutrophils Relative %: 53 %
Platelets: 254 10*3/uL (ref 150–400)
RBC: 4.54 MIL/uL (ref 4.22–5.81)
RDW: 13.4 % (ref 11.5–15.5)
WBC: 5.6 10*3/uL (ref 4.0–10.5)
nRBC: 0 % (ref 0.0–0.2)

## 2018-06-05 LAB — COMPREHENSIVE METABOLIC PANEL
ALT: 25 U/L (ref 0–44)
AST: 28 U/L (ref 15–41)
Albumin: 3.6 g/dL (ref 3.5–5.0)
Alkaline Phosphatase: 72 U/L (ref 38–126)
Anion gap: 7 (ref 5–15)
BUN: 22 mg/dL — ABNORMAL HIGH (ref 6–20)
CO2: 25 mmol/L (ref 22–32)
Calcium: 9.5 mg/dL (ref 8.9–10.3)
Chloride: 107 mmol/L (ref 98–111)
Creatinine, Ser: 1.68 mg/dL — ABNORMAL HIGH (ref 0.61–1.24)
GFR calc Af Amer: 53 mL/min — ABNORMAL LOW (ref >60)
GFR calc non Af Amer: 46 mL/min — ABNORMAL LOW (ref >60)
Glucose, Bld: 150 mg/dL — ABNORMAL HIGH (ref 70–99)
Potassium: 4.2 mmol/L (ref 3.5–5.1)
Sodium: 139 mmol/L (ref 135–145)
Total Bilirubin: 0.2 mg/dL — ABNORMAL LOW (ref 0.3–1.2)
Total Protein: 8.4 g/dL — ABNORMAL HIGH (ref 6.5–8.1)

## 2018-06-05 NOTE — ED Triage Notes (Signed)
Patient reports bilateral leg pain and worsening left lower leg wound infection with drainage onset this week .

## 2018-06-06 ENCOUNTER — Inpatient Hospital Stay (HOSPITAL_COMMUNITY): Payer: Medicare Other

## 2018-06-06 ENCOUNTER — Encounter (HOSPITAL_COMMUNITY): Payer: Medicare Other

## 2018-06-06 ENCOUNTER — Emergency Department (HOSPITAL_COMMUNITY): Payer: Medicare Other

## 2018-06-06 ENCOUNTER — Other Ambulatory Visit: Payer: Self-pay

## 2018-06-06 ENCOUNTER — Encounter (HOSPITAL_COMMUNITY): Payer: Self-pay | Admitting: Emergency Medicine

## 2018-06-06 DIAGNOSIS — L089 Local infection of the skin and subcutaneous tissue, unspecified: Secondary | ICD-10-CM

## 2018-06-06 DIAGNOSIS — Z794 Long term (current) use of insulin: Secondary | ICD-10-CM | POA: Diagnosis not present

## 2018-06-06 DIAGNOSIS — Z79891 Long term (current) use of opiate analgesic: Secondary | ICD-10-CM | POA: Diagnosis not present

## 2018-06-06 DIAGNOSIS — E1169 Type 2 diabetes mellitus with other specified complication: Secondary | ICD-10-CM | POA: Diagnosis present

## 2018-06-06 DIAGNOSIS — Z8261 Family history of arthritis: Secondary | ICD-10-CM | POA: Diagnosis not present

## 2018-06-06 DIAGNOSIS — B20 Human immunodeficiency virus [HIV] disease: Secondary | ICD-10-CM | POA: Diagnosis not present

## 2018-06-06 DIAGNOSIS — T148XXA Other injury of unspecified body region, initial encounter: Secondary | ICD-10-CM

## 2018-06-06 DIAGNOSIS — I87332 Chronic venous hypertension (idiopathic) with ulcer and inflammation of left lower extremity: Secondary | ICD-10-CM | POA: Diagnosis not present

## 2018-06-06 DIAGNOSIS — E119 Type 2 diabetes mellitus without complications: Secondary | ICD-10-CM | POA: Diagnosis not present

## 2018-06-06 DIAGNOSIS — I10 Essential (primary) hypertension: Secondary | ICD-10-CM

## 2018-06-06 DIAGNOSIS — Z91048 Other nonmedicinal substance allergy status: Secondary | ICD-10-CM | POA: Diagnosis not present

## 2018-06-06 DIAGNOSIS — Z79899 Other long term (current) drug therapy: Secondary | ICD-10-CM | POA: Diagnosis not present

## 2018-06-06 DIAGNOSIS — Z89511 Acquired absence of right leg below knee: Secondary | ICD-10-CM | POA: Diagnosis not present

## 2018-06-06 DIAGNOSIS — L97229 Non-pressure chronic ulcer of left calf with unspecified severity: Secondary | ICD-10-CM | POA: Diagnosis present

## 2018-06-06 DIAGNOSIS — I129 Hypertensive chronic kidney disease with stage 1 through stage 4 chronic kidney disease, or unspecified chronic kidney disease: Secondary | ICD-10-CM | POA: Diagnosis present

## 2018-06-06 DIAGNOSIS — S81802A Unspecified open wound, left lower leg, initial encounter: Secondary | ICD-10-CM | POA: Diagnosis not present

## 2018-06-06 DIAGNOSIS — E1142 Type 2 diabetes mellitus with diabetic polyneuropathy: Secondary | ICD-10-CM | POA: Diagnosis not present

## 2018-06-06 DIAGNOSIS — Z8631 Personal history of diabetic foot ulcer: Secondary | ICD-10-CM | POA: Diagnosis not present

## 2018-06-06 DIAGNOSIS — L84 Corns and callosities: Secondary | ICD-10-CM | POA: Diagnosis present

## 2018-06-06 DIAGNOSIS — Z8249 Family history of ischemic heart disease and other diseases of the circulatory system: Secondary | ICD-10-CM | POA: Diagnosis not present

## 2018-06-06 DIAGNOSIS — Z8619 Personal history of other infectious and parasitic diseases: Secondary | ICD-10-CM

## 2018-06-06 DIAGNOSIS — E1122 Type 2 diabetes mellitus with diabetic chronic kidney disease: Secondary | ICD-10-CM | POA: Diagnosis present

## 2018-06-06 DIAGNOSIS — Z22322 Carrier or suspected carrier of Methicillin resistant Staphylococcus aureus: Secondary | ICD-10-CM | POA: Diagnosis not present

## 2018-06-06 DIAGNOSIS — E1165 Type 2 diabetes mellitus with hyperglycemia: Secondary | ICD-10-CM | POA: Diagnosis present

## 2018-06-06 DIAGNOSIS — Z881 Allergy status to other antibiotic agents status: Secondary | ICD-10-CM

## 2018-06-06 DIAGNOSIS — G8929 Other chronic pain: Secondary | ICD-10-CM | POA: Diagnosis not present

## 2018-06-06 DIAGNOSIS — N179 Acute kidney failure, unspecified: Secondary | ICD-10-CM | POA: Diagnosis present

## 2018-06-06 DIAGNOSIS — L97929 Non-pressure chronic ulcer of unspecified part of left lower leg with unspecified severity: Secondary | ICD-10-CM | POA: Diagnosis not present

## 2018-06-06 DIAGNOSIS — Z818 Family history of other mental and behavioral disorders: Secondary | ICD-10-CM | POA: Diagnosis not present

## 2018-06-06 DIAGNOSIS — E785 Hyperlipidemia, unspecified: Secondary | ICD-10-CM | POA: Diagnosis not present

## 2018-06-06 DIAGNOSIS — N529 Male erectile dysfunction, unspecified: Secondary | ICD-10-CM | POA: Diagnosis present

## 2018-06-06 DIAGNOSIS — E669 Obesity, unspecified: Secondary | ICD-10-CM | POA: Diagnosis present

## 2018-06-06 DIAGNOSIS — Z882 Allergy status to sulfonamides status: Secondary | ICD-10-CM | POA: Diagnosis not present

## 2018-06-06 DIAGNOSIS — Z6839 Body mass index (BMI) 39.0-39.9, adult: Secondary | ICD-10-CM | POA: Diagnosis not present

## 2018-06-06 DIAGNOSIS — N183 Chronic kidney disease, stage 3 (moderate): Secondary | ICD-10-CM | POA: Diagnosis not present

## 2018-06-06 LAB — SEDIMENTATION RATE: Sed Rate: 74 mm/hr — ABNORMAL HIGH (ref 0–16)

## 2018-06-06 LAB — GLUCOSE, CAPILLARY
Glucose-Capillary: 102 mg/dL — ABNORMAL HIGH (ref 70–99)
Glucose-Capillary: 107 mg/dL — ABNORMAL HIGH (ref 70–99)
Glucose-Capillary: 163 mg/dL — ABNORMAL HIGH (ref 70–99)
Glucose-Capillary: 144 mg/dL — ABNORMAL HIGH (ref 70–99)

## 2018-06-06 LAB — PREALBUMIN: Prealbumin: 25.5 mg/dL (ref 18–38)

## 2018-06-06 LAB — C-REACTIVE PROTEIN: CRP: <0.8 mg/dL (ref <1.0)

## 2018-06-06 LAB — HEMOGLOBIN A1C
Hgb A1c MFr Bld: 7.8 % — ABNORMAL HIGH (ref 4.8–5.6)
Mean Plasma Glucose: 177.16 mg/dL

## 2018-06-06 LAB — MRSA PCR SCREENING: MRSA by PCR: POSITIVE — AB

## 2018-06-06 MED ORDER — ONDANSETRON HCL 4 MG PO TABS: 4.0000 mg | ORAL_TABLET | Freq: Four times a day (QID) | ORAL | Status: DC | PRN

## 2018-06-06 MED ORDER — CHLORHEXIDINE GLUCONATE CLOTH 2 % EX PADS
6.0000 | MEDICATED_PAD | Freq: Every day | CUTANEOUS | Status: DC
  Administered 2018-06-07 – 2018-06-08 (×2): 6 via TOPICAL

## 2018-06-06 MED ORDER — INSULIN ASPART 100 UNIT/ML ~~LOC~~ SOLN: 0.0000 [IU] | Freq: Every day | SUBCUTANEOUS | Status: DC

## 2018-06-06 MED ORDER — DOCUSATE SODIUM 100 MG PO CAPS
100.0000 mg | ORAL_CAPSULE | Freq: Two times a day (BID) | ORAL | Status: DC
  Administered 2018-06-06 – 2018-06-08 (×5): 100 mg via ORAL
  Filled 2018-06-06 (×5): qty 1

## 2018-06-06 MED ORDER — ENOXAPARIN SODIUM 40 MG/0.4ML ~~LOC~~ SOLN: 40.0000 mg | SUBCUTANEOUS | Status: DC

## 2018-06-06 MED ORDER — INSULIN ASPART 100 UNIT/ML ~~LOC~~ SOLN
0.0000 [IU] | Freq: Three times a day (TID) | SUBCUTANEOUS | Status: DC
  Administered 2018-06-06: 3 [IU] via SUBCUTANEOUS
  Administered 2018-06-07 (×2): 2 [IU] via SUBCUTANEOUS
  Administered 2018-06-07: 3 [IU] via SUBCUTANEOUS

## 2018-06-06 MED ORDER — DIPHENHYDRAMINE HCL 25 MG PO CAPS: 25.0000 mg | ORAL_CAPSULE | Freq: Four times a day (QID) | ORAL | Status: DC | PRN

## 2018-06-06 MED ORDER — ACETAMINOPHEN 325 MG PO TABS: 650.0000 mg | ORAL_TABLET | Freq: Four times a day (QID) | ORAL | Status: DC | PRN

## 2018-06-06 MED ORDER — METRONIDAZOLE 500 MG PO TABS
500.0000 mg | ORAL_TABLET | Freq: Three times a day (TID) | ORAL | Status: DC
  Administered 2018-06-06 – 2018-06-07 (×3): 500 mg via ORAL
  Filled 2018-06-06 (×3): qty 1

## 2018-06-06 MED ORDER — VANCOMYCIN HCL IN DEXTROSE 1-5 GM/200ML-% IV SOLN
1000.0000 mg | Freq: Once | INTRAVENOUS | Status: AC
  Administered 2018-06-06: 1000 mg via INTRAVENOUS
  Filled 2018-06-06: qty 200

## 2018-06-06 MED ORDER — SODIUM CHLORIDE 0.9 % IV SOLN
2.0000 g | INTRAVENOUS | Status: DC
  Administered 2018-06-06 – 2018-06-07 (×2): 2 g via INTRAVENOUS
  Filled 2018-06-06 (×2): qty 20

## 2018-06-06 MED ORDER — LACTATED RINGERS IV SOLN
INTRAVENOUS | Status: DC
  Administered 2018-06-06: 14:00:00 via INTRAVENOUS

## 2018-06-06 MED ORDER — VANCOMYCIN HCL 10 G IV SOLR
1500.0000 mg | INTRAVENOUS | Status: DC
  Administered 2018-06-06: 1500 mg via INTRAVENOUS
  Filled 2018-06-06 (×2): qty 1500

## 2018-06-06 MED ORDER — ONDANSETRON HCL 4 MG/2ML IJ SOLN: 4.0000 mg | Freq: Four times a day (QID) | INTRAMUSCULAR | Status: DC | PRN

## 2018-06-06 MED ORDER — SODIUM CHLORIDE 0.9 % IV SOLN
INTRAVENOUS | Status: DC
  Administered 2018-06-06: 18:00:00 via INTRAVENOUS

## 2018-06-06 MED ORDER — KETOROLAC TROMETHAMINE 30 MG/ML IJ SOLN: 30.0000 mg | Freq: Four times a day (QID) | INTRAMUSCULAR | Status: DC | PRN

## 2018-06-06 MED ORDER — AMLODIPINE BESYLATE 10 MG PO TABS
10.0000 mg | ORAL_TABLET | Freq: Every day | ORAL | Status: DC
  Administered 2018-06-06 – 2018-06-08 (×3): 10 mg via ORAL
  Filled 2018-06-06 (×3): qty 1

## 2018-06-06 MED ORDER — MUPIROCIN 2 % EX OINT
1.0000 "application " | TOPICAL_OINTMENT | Freq: Two times a day (BID) | CUTANEOUS | Status: DC
  Administered 2018-06-06 – 2018-06-08 (×5): 1 via NASAL
  Filled 2018-06-06 (×3): qty 22

## 2018-06-06 MED ORDER — INSULIN GLARGINE 100 UNIT/ML ~~LOC~~ SOLN
20.0000 [IU] | Freq: Every day | SUBCUTANEOUS | Status: DC
  Administered 2018-06-06 – 2018-06-07 (×2): 20 [IU] via SUBCUTANEOUS
  Filled 2018-06-06 (×3): qty 0.2

## 2018-06-06 MED ORDER — ABACAVIR-DOLUTEGRAVIR-LAMIVUD 600-50-300 MG PO TABS
1.0000 | ORAL_TABLET | Freq: Every day | ORAL | Status: DC
  Administered 2018-06-06 – 2018-06-08 (×3): 1 via ORAL
  Filled 2018-06-06 (×3): qty 1

## 2018-06-06 MED ORDER — ACETAMINOPHEN 650 MG RE SUPP: 650.0000 mg | Freq: Four times a day (QID) | RECTAL | Status: DC | PRN

## 2018-06-06 MED ORDER — DARUNAVIR-COBICISTAT 800-150 MG PO TABS
1.0000 | ORAL_TABLET | Freq: Every day | ORAL | Status: DC
  Administered 2018-06-06 – 2018-06-08 (×3): 1 via ORAL
  Filled 2018-06-06 (×3): qty 1

## 2018-06-06 MED ORDER — PIPERACILLIN-TAZOBACTAM 3.375 G IVPB 30 MIN
3.3750 g | Freq: Once | INTRAVENOUS | Status: AC
  Administered 2018-06-06: 3.375 g via INTRAVENOUS
  Filled 2018-06-06: qty 50

## 2018-06-06 MED ORDER — METRONIDAZOLE IN NACL 5-0.79 MG/ML-% IV SOLN
500.0000 mg | Freq: Three times a day (TID) | INTRAVENOUS | Status: DC
  Administered 2018-06-06: 500 mg via INTRAVENOUS
  Filled 2018-06-06 (×2): qty 100

## 2018-06-06 MED ORDER — ENOXAPARIN SODIUM 60 MG/0.6ML ~~LOC~~ SOLN
0.5000 mg/kg | SUBCUTANEOUS | Status: DC
  Administered 2018-06-06 – 2018-06-08 (×3): 60 mg via SUBCUTANEOUS
  Filled 2018-06-06 (×3): qty 0.6

## 2018-06-06 MED ORDER — TRAMADOL HCL 50 MG PO TABS
100.0000 mg | ORAL_TABLET | Freq: Four times a day (QID) | ORAL | Status: DC | PRN
  Administered 2018-06-07 – 2018-06-08 (×2): 100 mg via ORAL
  Filled 2018-06-06 (×3): qty 2

## 2018-06-06 NOTE — ED Provider Notes (Signed)
[facility] EMERGENCY DEPARTMENT Provider Note   CSN: [id number] Arrival date & time: 06/05/18  2116    History   Chief Complaint Chief Complaint  Patient presents with  . Wound Infection    left lower leg    HPI Keith [name] is a 53 y.o. male.     The history is provided by the patient.  Wound Check  This is a chronic problem. The current episode started more than 1 week ago. The problem occurs constantly. The problem has been gradually worsening. Pertinent negatives include no chest pain, no abdominal pain, no headaches and no shortness of breath. Nothing aggravates the symptoms. Nothing relieves the symptoms. Treatments tried: oral antibiotics started by his orthopedic surgeon. The treatment provided no relief.  Patient with AIDS and DM, poorly controlled with LLE calf wound who is being followed orthopedics and is on BID "green" oral antibiotics for nearly a month presents with worsening wound.  No f/c/r. It is bleeding and has foul smelling drainage.    Past Medical History:  Diagnosis Date  . Abscess of right foot   . AIDS ([place])   . Chronic knee pain    right  . Chronic pain   . Diabetes type 2, uncontrolled ([place])    HgA1c 17.6 (04/27/2010)  . Diabetic foot ulcer ([place]) 01/2017   right foot  . Erectile dysfunction   . Genital warts   . HIV (human immunodeficiency virus infection) ([place]) 2009   CD4 count 100, VL 13800 (05/01/2010)  . Hypertension   . Osteomyelitis ([place])    h/o hand  . Osteomyelitis of metatarsal ([place]) 04/28/2017    Patient Active Problem List   Diagnosis Date Noted  . Venous stasis ulcer of left calf limited to breakdown of skin without varicose veins ([place]) 04/23/2018  . Below knee amputation status, right 06/13/2017  . S/P transmetatarsal amputation of foot, right ([place]) 05/07/2017  . Wound infection   . Bacteremia   . Fever   . Sepsis ([place]) 03/15/2017  . CKD (chronic kidney disease) stage 3, GFR 30-59 ml/min (HCC)  03/15/2017  . Diabetic polyneuropathy associated with type 2 diabetes mellitus ([place]) 01/23/2017  . AKI (acute kidney injury) ([place]) 09/28/2016  . Nausea vomiting and diarrhea 09/28/2016  . Onychomycosis of multiple toenails with type 2 diabetes mellitus ([place]) 08/29/2015  . MRSA carrier 04/20/2014  . Testosterone deficiency 04/20/2014  . Penile wart 02/22/2014  . HIV disease ([place])   . Insulin-requiring or dependent type II diabetes mellitus ([place]) 02/04/2014  . Dental anomaly 11/20/2012  . Arthritis of right knee 02/23/2012  . Hyperlipidemia 11/10/2011  . Chronic pain 08/07/2011  . Meralgia paraesthetica 04/23/2011  . ERECTILE DYSFUNCTION 08/22/2008  . Essential hypertension 05/19/2008    Past Surgical History:  Procedure Laterality Date  . AMPUTATION Right 05/02/2017   Procedure: AMPUTATION TRANSMETARSAL;  Surgeon: [name], MD;  Location: [facility];  Service: Orthopedics;  Laterality: Right;  . AMPUTATION Right 06/13/2017   Procedure: RIGHT BELOW KNEE AMPUTATION;  Surgeon: [name], MD;  Location: [facility];  Service: Orthopedics;  Laterality: Right;  . BELOW KNEE LEG AMPUTATION Right 06/13/2017  . HERNIA REPAIR    . I&D EXTREMITY Left 08/21/2014   Procedure: INCISION AND DRAINAGE LEFT SMALL FINGER;  Surgeon: Leanora Cover, MD;  Location: [facility];  Service: Orthopedics;  Laterality: Left;  . I&D EXTREMITY Right 03/18/2017   Procedure: IRRIGATION AND DEBRIDEMENT EXTREMITY;  Surgeon: [name], MD;  Location: [facility];  Service: Orthopedics;  Laterality: Right;  . MINOR IRRIGATION AND DEBRIDEMENT OF WOUND Right 04/22/2014   Procedure: IRRIGATION AND DEBRIDEMENT OF RIGHT NECK ABCESS;  Surgeon: Jerrell Belfast, MD;  Location: [facility];  Service: ENT;  Laterality: Right;  . MULTIPLE EXTRACTIONS WITH ALVEOLOPLASTY N/A 01/18/2013   Procedure: MULTIPLE EXTRACION 3, 6, 7, 10, 11, 13, 21, 22, 27, 28, 29, 30 WITH ALVEOLOPLASTY;  Surgeon: Gae Bon, DDS;  Location: [facility];  Service: Oral Surgery;   Laterality: N/A;  . SKIN SPLIT GRAFT Right 03/21/2017   Procedure: IRRIGATION AND DEBRIDEMENT RIGHT FOOT AND APPLY SPLIT THICKNESS SKIN GRAFT AND WOUND VAC;  Surgeon: [name], MD;  Location: [facility];  Service: Orthopedics;  Laterality: Right;  . TEE WITHOUT CARDIOVERSION N/A 05/02/2017   Procedure: TRANSESOPHAGEAL ECHOCARDIOGRAM (TEE);  Surgeon: Acie Fredrickson Wonda Cheng, MD;  Location: [facility];  Service: Cardiovascular;  Laterality: N/A;  coincidental to orthopedic case        Home Medications    Prior to Admission medications   Medication Sig Start Date End Date Taking? Authorizing Provider  acetaminophen (TYLENOL) 500 MG tablet Take 1,000 mg by mouth every 6 (six) hours as needed (for pain).    [provider]  amLODipine (NORVASC) 10 MG tablet Take 1 tablet (10 mg total) by mouth daily. 01/17/17   Nita Sells, MD  cyclobenzaprine (FLEXERIL) 10 MG tablet Take 1 tablet (10 mg total) by mouth 2 (two) times daily as needed for muscle spasms. 10/25/16   Ashley Murrain, NP  doxycycline (VIBRAMYCIN) 100 MG capsule Take 1 capsule (100 mg total) by mouth 2 (two) times daily. 05/19/18   Rayburn, Neta Mends, PA-C  escitalopram (LEXAPRO) 10 MG tablet Take 10 mg by mouth daily.    [provider]  ferrous sulfate 325 (65 FE) MG tablet Take 325 mg by mouth daily with breakfast.    [provider]  gabapentin (NEURONTIN) 300 MG capsule Take 1 capsule (300 mg total) by mouth 3 (three) times daily. 06/17/17   [name], MD  glimepiride (AMARYL) 1 MG tablet Take 1 tablet (1 mg total) by mouth every morning. Patient not taking: Reported on 01/06/2018 01/16/17 01/16/18  Nita Sells, MD  glucose blood (COOL BLOOD GLUCOSE TEST STRIPS) test strip Use as instructed 01/05/18   [name], FNP  Insulin Glargine (LANTUS SOLOSTAR) 100 UNIT/ML Solostar Pen Inject 20 Units into the skin daily at 10 pm. 01/19/18   [name], FNP  Insulin Pen Needle (B-D UF III  MINI PEN NEEDLES) 31G X 5 MM MISC 20 Units by Does not apply route as directed. 01/19/18   [name], FNP  Insulin Syringes, Disposable, U-100 0.3 ML MISC 1 Syringe by Does not apply route 3 (three) times daily. 01/05/18   [name], FNP  Lancets [facility] ULTRASOFT) lancets Use as instructed 01/05/18   [name], FNP  levorphanol (LEVODROMORAN) 2 MG tablet Take 2 mg by mouth 3 (three) times daily. 10/21/17   [provider]  lisinopril (PRINIVIL,ZESTRIL) 5 MG tablet TAKE ONE TABLET BY MOUTH ONCE DAILY 10/06/17   [name], FNP  LORazepam (ATIVAN) 1 MG tablet Take 1 tablet (1 mg total) by mouth every 6 (six) hours as needed for anxiety. 01/30/15   [name], MD  mupirocin ointment (BACTROBAN) 2 % Apply 1 application topically daily. 03/27/18   Rayburn, Neta Mends, PA-C  Oxycodone HCl 10 MG TABS Take 1 tablet (10 mg total) by  mouth 3 (three) times daily as needed. 06/17/17   [name], MD  PREZCOBIX 800-150 MG tablet TAKE 1 TABLET BY MOUTH DAILY. SWALLOW WHOLE. DO NOT CRUSH, BREAK OR CHEW TABLETS. TAKE WITH FOOD. 03/27/18   [name], MD  sildenafil (VIAGRA) 50 MG tablet Take 1 tablet (50 mg total) by mouth as needed for erectile dysfunction. 01/06/18 01/06/19  [name], FNP  tadalafil (ADCIRCA/CIALIS) 20 MG tablet Take 0.5-1 tablets (10-20 mg total) by mouth every other day as needed for erectile dysfunction. 11/05/17   [name], FNP  traMADol (ULTRAM) 50 MG tablet Take 1 tablet (50 mg total) by mouth every 8 (eight) hours as needed. 09/12/17   [name], FNP  TRIUMEQ 600-50-300 MG tablet TAKE 1 TABLET BY MOUTH DAILY. 03/27/18   [name], MD  XTAMPZA ER 9 MG C12A Take 1 capsule by mouth 2 (two) times daily. 10/06/17   [provider]    Family History Family History  Problem Relation Age of Onset  . Hypertension Mother   . Arthritis Father   . Hypertension Father   . Hypertension Brother   .  Cancer Maternal Grandmother 86       unknown type of cancer  . Depression Paternal Grandmother     Social History Social History   Tobacco Use  . Smoking status: Never Smoker  . Smokeless tobacco: Never Used  Substance Use Topics  . Alcohol use: No    Alcohol/week: 0.0 standard drinks  . Drug use: No     Allergies   Sulfa antibiotics   Review of Systems Review of Systems  Constitutional: Negative for diaphoresis and fever.  Respiratory: Negative for shortness of breath.   Cardiovascular: Negative for chest pain.  Gastrointestinal: Negative for abdominal pain.  Skin: Positive for color change and wound.  Neurological: Negative for weakness, numbness and headaches.  All other systems reviewed and are negative.    Physical Exam Updated Vital Signs BP 121/83   Pulse 86   Temp 98.4 F (36.9 C) (Oral)   Resp 20   SpO2 97%   Physical Exam Vitals signs and nursing note reviewed.  Constitutional:      General: He is not in acute distress.    Appearance: He is obese.  HENT:     Head: Normocephalic and atraumatic.     Nose: Nose normal.     Mouth/Throat:     Mouth: Mucous membranes are moist.     Pharynx: Oropharynx is clear.  Eyes:     Conjunctiva/sclera: Conjunctivae normal.     Pupils: Pupils are equal, round, and reactive to light.  Neck:     Musculoskeletal: Normal range of motion and neck supple.  Cardiovascular:     Rate and Rhythm: Normal rate and regular rhythm.     Pulses: Normal pulses.     Heart sounds: Normal heart sounds.  Pulmonary:     Effort: Pulmonary effort is normal.     Breath sounds: Normal breath sounds.  Abdominal:     General: Abdomen is flat. Bowel sounds are normal.     Tenderness: There is no abdominal tenderness. There is no guarding.  Musculoskeletal: Normal range of motion.  Skin:    General: Skin is warm.     Capillary Refill: Capillary refill takes less than 2 seconds.       Neurological:     General: No focal deficit  present.     Mental Status: He is  alert.     Deep Tendon Reflexes: Reflexes normal.  Psychiatric:        Mood and Affect: Mood normal.        Behavior: Behavior normal.      ED Treatments / Results  Labs (all labs ordered are listed, but only abnormal results are displayed) Results for orders placed or performed during the hospital encounter of 06/05/18  CBC with Differential  Result Value Ref Range   WBC 5.6 4.0 - 10.5 K/uL   RBC 4.54 4.22 - 5.81 MIL/uL   Hemoglobin 11.9 (L) 13.0 - 17.0 g/dL   HCT 38.7 (L) 39.0 - 52.0 %   MCV 85.2 80.0 - 100.0 fL   MCH 26.2 26.0 - 34.0 pg   MCHC 30.7 30.0 - 36.0 g/dL   RDW 13.4 11.5 - 15.5 %   Platelets 254 150 - 400 K/uL   nRBC 0.0 0.0 - 0.2 %   Neutrophils Relative % 53 %   Neutro Abs 3.0 1.7 - 7.7 K/uL   Lymphocytes Relative 36 %   Lymphs Abs 2.0 0.7 - 4.0 K/uL   Monocytes Relative 8 %   Monocytes Absolute 0.5 0.1 - 1.0 K/uL   Eosinophils Relative 2 %   Eosinophils Absolute 0.1 0.0 - 0.5 K/uL   Basophils Relative 0 %   Basophils Absolute 0.0 0.0 - 0.1 K/uL   Immature Granulocytes 1 %   Abs Immature Granulocytes 0.05 0.00 - 0.07 K/uL  Comprehensive metabolic panel  Result Value Ref Range   Sodium 139 135 - 145 mmol/L   Potassium 4.2 3.5 - 5.1 mmol/L   Chloride 107 98 - 111 mmol/L   CO2 25 22 - 32 mmol/L   Glucose, Bld 150 (H) 70 - 99 mg/dL   BUN 22 (H) 6 - 20 mg/dL   Creatinine, Ser 1.68 (H) 0.61 - 1.24 mg/dL   Calcium 9.5 8.9 - 10.3 mg/dL   Total Protein 8.4 (H) 6.5 - 8.1 g/dL   Albumin 3.6 3.5 - 5.0 g/dL   AST 28 15 - 41 U/L   ALT 25 0 - 44 U/L   Alkaline Phosphatase 72 38 - 126 U/L   Total Bilirubin 0.2 (L) 0.3 - 1.2 mg/dL   GFR calc non Af Amer 46 (L) >60 mL/min   GFR calc Af Amer 53 (L) >60 mL/min   Anion gap 7 5 - 15   No results found.  Radiology No results found.  Procedures Procedures (including critical care time)  Medications Ordered in ED Medications  vancomycin (VANCOCIN) IVPB 1000 mg/200 mL premix  (1,000 mg Intravenous New Bag/Given 06/06/18 0513)  piperacillin-tazobactam (ZOSYN) IVPB 3.375 g (has no administration in time range)      Final Clinical Impressions(s) / ED Diagnoses   Final diagnoses:  Infected wound    DM wound failure of outpatient management will admit   Chico Cawood, MD 06/06/18 1157

## 2018-06-06 NOTE — Progress Notes (Signed)
Pharmacy Antibiotic Note  Keith [name] is a 53 y.o. male admitted on 06/05/2018 with diabetic foot infection.  Pharmacy has been consulted for vancomycin dosing.  Vancomycin 1,000 mg IV x 1 given in ED  Plan: Vancomycin 1,500 mg IV Q 24 hrs. Goal AUC 400-550. Expected AUC: 535.1 SCr used: 1.68  Height: 5\' 9"  (175.3 cm) Weight: 265 lb 3.4 oz (120.3 kg) IBW/kg (Calculated) : 70.7  Temp (24hrs), Avg:98.4 F (36.9 C), Min:98.3 F (36.8 C), Max:98.4 F (36.9 C)  Recent Labs  Lab 06/05/18 2233  WBC 5.6  CREATININE 1.68*    Estimated Creatinine Clearance: 65.1 mL/min (A) (by C-G formula based on SCr of 1.68 mg/dL (H)).    Allergies  Allergen Reactions  . Sulfur Itching    Patient stated he's allergic to "sulfur" AND "sulfa"  . Sulfa Antibiotics Itching    Antimicrobials this admission: Vancomycin 2/29 >>  Zosyn 2/29 >>   Dose adjustments this admission: none  Microbiology results: 2/29 BCx: sent  Thank you for allowing pharmacy to be a part of this patient's care.  Vertis Kelch, PharmD PGY1 Pharmacy Resident Phone [phone] 06/06/2018       9:19 AM

## 2018-06-06 NOTE — Consult Note (Signed)
[facility]  Total days of antibiotics 2       Reason for Consult: deep tissue infection    Referring Physician: yates  Principal Problem:   Wound infection Active Problems:   Essential hypertension   Chronic pain   Hyperlipidemia   Insulin-requiring or dependent type II diabetes mellitus (HCC)   HIV disease (HCC)   CKD (chronic kidney disease) stage 3, GFR 30-59 ml/min (HCC)    HPI: Keith [name] is a 53 y.o. male with advanced hiv disease, CD 4 count of 80/VL<20 in Jun 2019 on triumeq-DRVc.Hx of htn, DM c/b DFU, hx of MSSA bacteremia assoc with DFU in Jan 2019 s/p R BKa, admitted for left calf ulcer x 3 wk. Followed by dr duda getting medihoney treatment. He reports increasing pain and foul drainage. Afebrile. Large lesions full thickness with central eschar. Started on broad spectrum abtx.   Past Medical History:  Diagnosis Date  . Abscess of right foot   . AIDS ([place])   . Chronic knee pain    right  . Chronic pain   . Diabetes type 2, uncontrolled ([place])    HgA1c 17.6 (04/27/2010)  . Diabetic foot ulcer ([place]) 01/2017   right foot  . Erectile dysfunction   . Genital warts   . HIV (human immunodeficiency virus infection) ([place]) 2009   CD4 count 100, VL 13800 (05/01/2010)  . Hypertension   . Osteomyelitis ([place])    h/o hand  . Osteomyelitis of metatarsal ([place]) 04/28/2017    Allergies:  Allergies  Allergen Reactions  . Sulfur Itching    Patient stated he's allergic to "sulfur" AND "sulfa"  . Sulfa Antibiotics Itching    MEDICATIONS: . abacavir-dolutegravir-lamiVUDine  1 tablet Oral Daily  . amLODipine  10 mg Oral Daily  . darunavir-cobicistat  1 tablet Oral Q breakfast  . docusate sodium  100 mg Oral BID  . enoxaparin (LOVENOX) injection  0.5 mg/kg Subcutaneous Q24H  . insulin aspart  0-15 Units Subcutaneous TID WC  . insulin aspart  0-5 Units Subcutaneous QHS  . insulin glargine  20 Units Subcutaneous QHS    Social History    Tobacco Use  . Smoking status: Never Smoker  . Smokeless tobacco: Never Used  Substance Use Topics  . Alcohol use: No    Alcohol/week: 0.0 standard drinks  . Drug use: Not Currently    Comment: OTC ASA    Family History  Problem Relation Age of Onset  . Hypertension Mother   . Arthritis Father   . Hypertension Father   . Hypertension Brother   . Cancer Maternal Grandmother 64       unknown type of cancer  . Depression Paternal Grandmother      Review of Systems  Constitutional: Negative for fever, chills, diaphoresis, activity change, appetite change, fatigue and unexpected weight change.  HENT: Negative for congestion, sore throat, rhinorrhea, sneezing, trouble swallowing and sinus pressure.  Eyes: Negative for photophobia and visual disturbance.  Respiratory: Negative for cough, chest tightness, shortness of breath, wheezing and stridor.  Cardiovascular: Negative for chest pain, palpitations and leg swelling.  Gastrointestinal: Negative for nausea, vomiting, abdominal pain, diarrhea, constipation, blood in stool, abdominal distention and anal bleeding.  Genitourinary: Negative for dysuria, hematuria, flank pain and difficulty urinating.  Musculoskeletal: Negative for myalgias, back pain, joint swelling, arthralgias and gait problem.  Skin: + left leg wound Neurological: Negative for dizziness, tremors, weakness and light-headedness.  Hematological: Negative for adenopathy. Does not  bruise/bleed easily.  Psychiatric/Behavioral: Negative for behavioral problems, confusion, sleep disturbance, dysphoric mood, decreased concentration and agitation.     OBJECTIVE: Temp:  [98.3 F (36.8 C)-98.4 F (36.9 C)] 98.3 F (36.8 C) (02/29 0809) Pulse Rate:  [78-92] 86 (02/29 0809) Resp:  [17-20] 17 (02/29 0809) BP: (121-183)/(78-114) 133/91 (02/29 0809) SpO2:  [97 %-100 %] 100 % (02/29 0809) Weight:  [120.3 kg] 120.3 kg (02/29 0809) Physical Exam  Constitutional: He is  oriented to person, place, and time. He appears well-developed and well-nourished. No distress.  HENT:  Mouth/Throat: Oropharynx is clear and moist. No oropharyngeal exudate.  Cardiovascular: Normal rate, regular rhythm and normal heart sounds. Exam reveals no gallop and no friction rub.  No murmur heard.  Pulmonary/Chest: Effort normal and breath sounds normal. No respiratory distress. He has no wheezes.  Abdominal: Soft. Bowel sounds are normal. He exhibits no distension. There is no tenderness.  extL right bka, left leg is dry, lower extremity wrapped  Lymphadenopathy:  He has no cervical adenopathy.  Neurological: He is alert and oriented to person, place, and time.  Skin: Skin is warm and dry. No rash noted. No erythema.  Psychiatric: He has a normal mood and affect. His behavior is normal.     LABS: Results for orders placed or performed during the hospital encounter of 06/05/18 (from the past 48 hour(s))  CBC with Differential     Status: Abnormal   Collection Time: 06/05/18 10:33 PM  Result Value Ref Range   WBC 5.6 4.0 - 10.5 K/uL   RBC 4.54 4.22 - 5.81 MIL/uL   Hemoglobin 11.9 (L) 13.0 - 17.0 g/dL   HCT 38.7 (L) 39.0 - 52.0 %   MCV 85.2 80.0 - 100.0 fL   MCH 26.2 26.0 - 34.0 pg   MCHC 30.7 30.0 - 36.0 g/dL   RDW 13.4 11.5 - 15.5 %   Platelets 254 150 - 400 K/uL   nRBC 0.0 0.0 - 0.2 %   Neutrophils Relative % 53 %   Neutro Abs 3.0 1.7 - 7.7 K/uL   Lymphocytes Relative 36 %   Lymphs Abs 2.0 0.7 - 4.0 K/uL   Monocytes Relative 8 %   Monocytes Absolute 0.5 0.1 - 1.0 K/uL   Eosinophils Relative 2 %   Eosinophils Absolute 0.1 0.0 - 0.5 K/uL   Basophils Relative 0 %   Basophils Absolute 0.0 0.0 - 0.1 K/uL   Immature Granulocytes 1 %   Abs Immature Granulocytes 0.05 0.00 - 0.07 K/uL    Comment: Performed at [facility], [address], [place], [place] [address]  Comprehensive metabolic panel     Status: Abnormal   Collection Time: 06/05/18 10:33 PM  Result  Value Ref Range   Sodium 139 135 - 145 mmol/L   Potassium 4.2 3.5 - 5.1 mmol/L   Chloride 107 98 - 111 mmol/L   CO2 25 22 - 32 mmol/L   Glucose, Bld 150 (H) 70 - 99 mg/dL   BUN 22 (H) 6 - 20 mg/dL   Creatinine, Ser 1.68 (H) 0.61 - 1.24 mg/dL   Calcium 9.5 8.9 - 10.3 mg/dL   Total Protein 8.4 (H) 6.5 - 8.1 g/dL   Albumin 3.6 3.5 - 5.0 g/dL   AST 28 15 - 41 U/L   ALT 25 0 - 44 U/L   Alkaline Phosphatase 72 38 - 126 U/L   Total Bilirubin 0.2 (L) 0.3 - 1.2 mg/dL   GFR calc non Af Amer 46 (  L) >60 mL/min   GFR calc Af Amer 53 (L) >60 mL/min   Anion gap 7 5 - 15    Comment: Performed at [facility] [address], [place], [place] [address]  Glucose, capillary     Status: Abnormal   Collection Time: 06/06/18  8:23 AM  Result Value Ref Range   Glucose-Capillary 102 (H) 70 - 99 mg/dL  Sedimentation rate     Status: Abnormal   Collection Time: 06/06/18  9:38 AM  Result Value Ref Range   Sed Rate 74 (H) 0 - 16 mm/hr    Comment: Performed at [facility] [address] [place], [place] [address]  C-reactive protein     Status: None   Collection Time: 06/06/18  9:38 AM  Result Value Ref Range   CRP <0.8 <1.0 mg/dL    Comment: Performed at [facility], [place] [address], [place], [place] [address]  Prealbumin     Status: None   Collection Time: 06/06/18  9:38 AM  Result Value Ref Range   Prealbumin 25.5 18 - 38 mg/dL    Comment: Performed at [facility] [address], [place], [place] [address]  Glucose, capillary     Status: Abnormal   Collection Time: 06/06/18 12:36 PM  Result Value Ref Range   Glucose-Capillary 107 (H) 70 - 99 mg/dL    MICRO: pending IMAGING: Dg Tibia/fibula Left  Result Date: 06/06/2018 CLINICAL DATA:  53 year old male with open losing wound on the lateral aspect of the left lower extremity. EXAM: LEFT TIBIA AND FIBULA - 2 VIEW COMPARISON:  Left knee radiograph dated 10/24/2009 FINDINGS: There is no acute fracture or dislocation.  The bones are well mineralized. No significant arthritic changes. No joint effusion. Mild diffuse subcutaneous edema. No soft tissue gas. IMPRESSION: 1. No acute/traumatic osseous pathology. 2. No soft tissue gas. Electronically Signed   By: Anner Crete M.D.   On: 06/06/2018 05:58    Assessment/Plan:  Soft tissue infection - await ct findings to see if need surgical debridement. May only need bedside. Consider having duda see him - continue on broad spectrum for now- continue on vanco/ceftriaxone and oral metronidazole - as it improves can switch to orals  hiv disease = will check cd 4 count and viral load

## 2018-06-06 NOTE — H&P (Signed)
History and Physical    Keith [name] NID:[id number] DOB: 1965-05-06 DOA: 06/05/2018  PCP: [name], FNP Consultants:  Sharol Given - orthopedics; [place] - ID Patient coming from:  Home - lives with mother; Donald Prose: Mother, [phone]  Chief Complaint: Wound infection  HPI: Keith [name] is a 53 y.o. male with medical history significant of Osteomyelitis, Hypertension, HIV, Genital Warts, Erectile Dysfunction, Diabetes, Diabetic Foot Ulcer, Chronic Pain, Chronic Knee Pain, AIDS, and Right BKA presenting with LE wound. He has an ulcer and has been trying to work on it.  He hasn't been wearing a compression stocking.  The ulcer has been present for about 3 weeks.  Dr. Jess Barters office has been "putting honey on it."  No fevers.  He is feeling well otherwise.  It started smelling and was hurting.  He would like to be referred to pain management - has been referred to [facility] in the past and that is too far to drive.  He was last seen by [name] on 1/6 for this issue, indicating that it has been present significantly longer than the 3 weeks reported by the patient.   ED Course:  Carryover, per Dr. Marlowe Sax:  History provided by ED provider: Patient followed by outpatient orthopedics for right lower extremity AKA and a left lower extremity wound. He does not go to wound care or any other physician for the management of this wound. He has been taking antibiotics for the past 1 month. Has a large ulcer on the lateral aspect of his left calf with copious amounts of purulent drainage and necrotic tissue. Wound is extremely foul-smelling. X-ray without evidence of gas gangrene or osseous pathology. Afebrile and no leukocytosis. Patient started on vancomycin and Zosyn. Will need debridement and aggressive wound care.  Review of Systems: As per HPI; otherwise review of systems reviewed and negative.   Ambulatory Status:  Ambulates with prosthetic  Past Medical History:    Diagnosis Date  . Abscess of right foot   . AIDS ([place])   . Chronic knee pain    right  . Chronic pain   . Diabetes type 2, uncontrolled ([place])    HgA1c 17.6 (04/27/2010)  . Diabetic foot ulcer ([place]) 01/2017   right foot  . Erectile dysfunction   . Genital warts   . HIV (human immunodeficiency virus infection) ([place]) 2009   CD4 count 100, VL 13800 (05/01/2010)  . Hypertension   . Osteomyelitis ([place])    h/o hand  . Osteomyelitis of metatarsal ([place]) 04/28/2017    Past Surgical History:  Procedure Laterality Date  . AMPUTATION Right 05/02/2017   Procedure: AMPUTATION TRANSMETARSAL;  Surgeon: [name], MD;  Location: [facility];  Service: Orthopedics;  Laterality: Right;  . AMPUTATION Right 06/13/2017   Procedure: RIGHT BELOW KNEE AMPUTATION;  Surgeon: [name], MD;  Location: [facility];  Service: Orthopedics;  Laterality: Right;  . BELOW KNEE LEG AMPUTATION Right 06/13/2017  . HERNIA REPAIR    . I&D EXTREMITY Left 08/21/2014   Procedure: INCISION AND DRAINAGE LEFT SMALL FINGER;  Surgeon: Leanora Cover, MD;  Location: [facility];  Service: Orthopedics;  Laterality: Left;  . I&D EXTREMITY Right 03/18/2017   Procedure: IRRIGATION AND DEBRIDEMENT EXTREMITY;  Surgeon: [name], MD;  Location: [facility];  Service: Orthopedics;  Laterality: Right;  . MINOR IRRIGATION AND DEBRIDEMENT OF WOUND Right 04/22/2014   Procedure: IRRIGATION AND DEBRIDEMENT OF RIGHT NECK ABCESS;  Surgeon: Jerrell Belfast, MD;  Location: [facility];  Service: ENT;  Laterality: Right;  . MULTIPLE EXTRACTIONS WITH ALVEOLOPLASTY N/A 01/18/2013   Procedure: MULTIPLE EXTRACION 3, 6, 7, 10, 11, 13, 21, 22, 27, 28, 29, 30 WITH ALVEOLOPLASTY;  Surgeon: Gae Bon, DDS;  Location: [facility];  Service: Oral Surgery;  Laterality: N/A;  . SKIN SPLIT GRAFT Right 03/21/2017   Procedure: IRRIGATION AND DEBRIDEMENT RIGHT FOOT AND APPLY SPLIT THICKNESS SKIN GRAFT AND WOUND VAC;  Surgeon: [name], MD;  Location: [facility];  Service: Orthopedics;   Laterality: Right;  . TEE WITHOUT CARDIOVERSION N/A 05/02/2017   Procedure: TRANSESOPHAGEAL ECHOCARDIOGRAM (TEE);  Surgeon: Acie Fredrickson Wonda Cheng, MD;  Location: [facility] OR;  Service: Cardiovascular;  Laterality: N/A;  coincidental to orthopedic case    Social History   Socioeconomic History  . Marital status: Single    Spouse name: Not on file  . Number of children: 3  . Years of education: 52  . Highest education level: Not on file  Occupational History  . Occupation: disabled  Social Needs  . Financial resource strain: Not on file  . Food insecurity:    Worry: Not on file    Inability: Not on file  . Transportation needs:    Medical: Not on file    Non-medical: Not on file  Tobacco Use  . Smoking status: Never Smoker  . Smokeless tobacco: Never Used  Substance and Sexual Activity  . Alcohol use: No    Alcohol/week: 0.0 standard drinks  . Drug use: Not Currently    Comment: OTC ASA  . Sexual activity: Yes    Partners: Female    Comment: given condoms  Lifestyle  . Physical activity:    Days per week: Not on file    Minutes per session: Not on file  . Stress: Not on file  Relationships  . Social connections:    Talks on phone: Not on file    Gets together: Not on file    Attends religious service: Not on file    Active member of club or organization: Not on file    Attends meetings of clubs or organizations: Not on file    Relationship status: Not on file  . Intimate partner violence:    Fear of current or ex partner: Not on file    Emotionally abused: Not on file    Physically abused: Not on file    Forced sexual activity: Not on file  Other Topics Concern  . Not on file  Social History Narrative   Worked for the city of [place] for 18 years.   Unemployed.    Applying for disability.   Medicaid patient.    Allergies  Allergen Reactions  . Sulfur Itching    Patient stated he's allergic to "sulfur" AND "sulfa"  . Sulfa Antibiotics Itching    Family History   Problem Relation Age of Onset  . Hypertension Mother   . Arthritis Father   . Hypertension Father   . Hypertension Brother   . Cancer Maternal Grandmother 23       unknown type of cancer  . Depression Paternal Grandmother     Prior to Admission medications   Medication Sig Start Date End Date Taking? Authorizing Provider  doxycycline (VIBRAMYCIN) 100 MG capsule Take 1 capsule (100 mg total) by mouth 2 (two) times daily. 05/19/18  Yes Rayburn, Neta Mends, PA-C  Insulin Glargine (LANTUS SOLOSTAR) 100 UNIT/ML Solostar Pen Inject 20 Units into the skin daily at 10 pm. Patient taking differently: Inject  20 Units into the skin at bedtime.  01/19/18  Yes [name], FNP  PREZCOBIX 800-150 MG tablet TAKE 1 TABLET BY MOUTH DAILY. SWALLOW WHOLE. DO NOT CRUSH, BREAK OR CHEW TABLETS. TAKE WITH FOOD. Patient taking differently: Take 1 tablet by mouth daily.  03/27/18  Yes [name], MD  tadalafil (ADCIRCA/CIALIS) 20 MG tablet Take 0.5-1 tablets (10-20 mg total) by mouth every other day as needed for erectile dysfunction. 11/05/17  Yes [name], FNP  TRIUMEQ 600-50-300 MG tablet TAKE 1 TABLET BY MOUTH DAILY. Patient taking differently: Take 1 tablet by mouth daily. May take with or without food. 03/27/18  Yes [name], MD  amLODipine (NORVASC) 10 MG tablet Take 1 tablet (10 mg total) by mouth daily. Patient not taking: Reported on 06/06/2018 01/17/17   Nita Sells, MD  cyclobenzaprine (FLEXERIL) 10 MG tablet Take 1 tablet (10 mg total) by mouth 2 (two) times daily as needed for muscle spasms. Patient not taking: Reported on 06/06/2018 10/25/16   Ashley Murrain, NP  gabapentin (NEURONTIN) 300 MG capsule Take 1 capsule (300 mg total) by mouth 3 (three) times daily. Patient not taking: Reported on 06/06/2018 06/17/17   [name], MD  glimepiride (AMARYL) 1 MG tablet Take 1 tablet (1 mg total) by mouth every morning. Patient not taking: Reported on 06/06/2018  01/16/17 06/06/18  Nita Sells, MD  glucose blood (COOL BLOOD GLUCOSE TEST STRIPS) test strip Use as instructed 01/05/18   [name], FNP  Insulin Pen Needle (B-D UF III MINI PEN NEEDLES) 31G X 5 MM MISC 20 Units by Does not apply route as directed. 01/19/18   [name], FNP  Insulin Syringes, Disposable, U-100 0.3 ML MISC 1 Syringe by Does not apply route 3 (three) times daily. 01/05/18   [name], FNP  Lancets [facility] (1-Rh) ULTRASOFT) lancets Use as instructed 01/05/18   [name], FNP  lisinopril (PRINIVIL,ZESTRIL) 5 MG tablet TAKE ONE TABLET BY MOUTH ONCE DAILY Patient not taking: Reported on 06/06/2018 10/06/17   [name], FNP  LORazepam (ATIVAN) 1 MG tablet Take 1 tablet (1 mg total) by mouth every 6 (six) hours as needed for anxiety. Patient not taking: Reported on 06/06/2018 01/30/15   [name], MD  mupirocin ointment (BACTROBAN) 2 % Apply 1 application topically daily. Patient not taking: Reported on 06/06/2018 03/27/18   Rayburn, Neta Mends, PA-C  Oxycodone HCl 10 MG TABS Take 1 tablet (10 mg total) by mouth 3 (three) times daily as needed. Patient not taking: Reported on 06/06/2018 06/17/17   [name], MD  sildenafil (VIAGRA) 50 MG tablet Take 1 tablet (50 mg total) by mouth as needed for erectile dysfunction. Patient not taking: Reported on 06/06/2018 01/06/18 01/06/19  [name], FNP  traMADol (ULTRAM) 50 MG tablet Take 1 tablet (50 mg total) by mouth every 8 (eight) hours as needed. Patient not taking: Reported on 06/06/2018 09/12/17   [name], FNP    Physical Exam: Vitals:   06/06/18 0431 06/06/18 0700 06/06/18 0715 06/06/18 0809  BP: (!) 183/114 (!) 150/100 (!) 153/104 (!) 133/91  Pulse: 92 89 85 86  Resp:    17  Temp:    98.3 F (36.8 C)  TempSrc:    Oral  SpO2: 100% 100% 98% 100%  Weight:    120.3 kg  Height:    5\' 9"  (1.753 m)     . General:  Appears calm and  comfortable and is NAD . Eyes:   PERRL, EOMI, normal lids, iris . ENT:  grossly normal hearing, lips & tongue, mmm; edentulous . Neck:  no LAD, masses or thyromegaly . Cardiovascular:  RRR, no m/r/g. No LE edema.  Marland Kitchen Respiratory:   CTA bilaterally with no wheezes/rales/rhonchi.  Normal respiratory effort. . Abdomen:  soft, NT, ND, NABS . Back:   normal alignment, no CVAT . Skin: He has what appears to be a shallow ulceration but with eschar and purulent, foul-smelling drainage on his left lateral lower leg.  There are other ulcerations adjacent which are less concerning.  He also has a large callous along his left lateral midfoot.     . Musculoskeletal: as described above; also with R BKA . Left lower extremity:  No LE edema.  Limited foot exam with no ulcerations but with callous formation as described above and significant dry, scaling skin.  1-2+ distal pulses. Marland Kitchen Psychiatric:  grossly normal mood and affect, speech fluent and appropriate, AOx3 . Neurologic:  CN 2-12 grossly intact, moves all extremities in coordinated fashion, sensation intact    Radiological Exams on Admission: Dg Tibia/fibula Left  Result Date: 06/06/2018 CLINICAL DATA:  53 year old male with open losing wound on the lateral aspect of the left lower extremity. EXAM: LEFT TIBIA AND FIBULA - 2 VIEW COMPARISON:  Left knee radiograph dated 10/24/2009 FINDINGS: There is no acute fracture or dislocation. The bones are well mineralized. No significant arthritic changes. No joint effusion. Mild diffuse subcutaneous edema. No soft tissue gas. IMPRESSION: 1. No acute/traumatic osseous pathology. 2. No soft tissue gas. Electronically Signed   By: Anner Crete M.D.   On: 06/06/2018 05:58    EKG: not done   Labs on Admission: I have personally reviewed the available labs and imaging studies at the time of the admission.  Pertinent labs:   Glucose 150 BUN 22/Creatinine 1.68/GFR 53; baseline creatinine about 1.4 WBC 5.6 Hgb 11.9 -  stable   Assessment/Plan Principal Problem:   Wound infection Active Problems:   Essential hypertension   Chronic pain   Hyperlipidemia   Insulin-requiring or dependent type II diabetes mellitus (HCC)   HIV disease (HCC)   CKD (chronic kidney disease) stage 3, GFR 30-59 ml/min (HCC)   LLE wound infection -Patient has been followed by orthopedics for venous stasis ulcer, treated with medical-grade honey therapy -He presented for worsening drainage with purulence and foul odor -He has a h/o MRSA in 3/19; will place on contact precautions while obtaining MRSA PCR -Will admit, Med Surg -Antibiotics with Rocephin, Flagyl, and Vancomycin per lower extremity wound infection order set -With negative X-ray, will need MRI to assess for osteomyelitis -Lower extremity wound infection order set utilized, including orders for CM, SW, DM coordinator, wound care, and nutrition consult as well as for consult to peripheral vascular navigator. -ABI ordered to assess if vascular intervention is needed to nesure wound healing. -Goal would be for glucose <150 to facilitate wound healing. -Patient should be on bed rest, non-weight bearing. -Excellent BP control is needed   HIV -He was last seen by Dr. [name] in 6/19.   -At that time, his viral load was undetectable -CD4 count in 1/19 was 80 -Recheck CD4 and viral load -He was due for RTC in 10/19 - there is concern for suboptimal compliance at this time -Will request ID consult -Continue Prezcobix, Triumeq  Stage 3 CKD -Mildly increased from baseline but not high enough for AKI -Will gently hydrate and follow  DM -A1c in 10/19 was 6.4 -Will check A1c -Continue Lantus -Cover with moderate-scale SSI  HTN -Patient apparently stopped taking Norvasc -However, he appears to need resumption at this time -Restart Norvasc 10 mg PO daily  Chronic pain -He was seen at [facility] in Dec 2019 and left prior to completion of evaluation when  he learned that they would not be prescribing opiate medications. -He has a DOC record including possession of schedule II substance and multiple counts of obtaining controlled substance by fraud. --I have reviewed this patient in the [place] Controlled Substances Reporting System.  He has not received a prescription for controlled substances since 10/19. -However, he remains at extremely high risk of opioid and also increased risk of misuse, diversion, or overdose. -He requests referral to a local pain clinic at the time of discharge -Will try to control pain with use of Toradol and Tramadol at this time    DVT prophylaxis:  Lovenox  Code Status:  Full - confirmed with patient Family Communication: None present  Disposition Plan:  Home once clinically improved Consults called: ID; CM; DM coordinator; peripheral vascular navigator; SW; wound care; nutrition Admission status: Admit - It is my clinical opinion that admission to [place] is reasonable and necessary because of the expectation that this patient will require hospital care that crosses at least 2 midnights to treat this condition based on the medical complexity of the problems presented.  Given the aforementioned information, the predictability of an adverse outcome is felt to be significant.    Karmen Bongo MD [facility]   How to contact the [facility] Attending or Consulting provider [place] or covering provider during after hours [place], for this patient?  1. Check the care team in [facility] and look for a) attending/consulting TR[facility] provider listed and b) the [facility] team listed 2. Log into [email or web address] and use [place]'s universal password to access. If you do not have the password, please contact the hospital operator. 3. Locate the [facility] provider you are looking for under [facility] and page to a number that you can be directly reached. 4. If you still have difficulty reaching the provider, please page the [facility] (Director on  Call) for the Hospitalists listed on amion for assistance.   06/06/2018, 9:32 AM

## 2018-06-06 NOTE — Progress Notes (Signed)
Inpatient Diabetes Program Recommendations  AACE/ADA: New Consensus Statement on Inpatient Glycemic Control  Target Ranges:  Prepandial:   less than 140 mg/dL      Peak postprandial:   less than 180 mg/dL (1-2 hours)      Critically ill patients:  140 - 180 mg/dL   Results for Keith [name], Keith [name] (MRN [id number]) as of 06/06/2018 10:23  Ref. Range 06/06/2018 08:23  Glucose-Capillary Latest Ref Range: 70 - 99 mg/dL 102 (H)   Results for Keith [name], Keith [name] (MRN [id number]) as of 06/06/2018 10:23  Ref. Range 01/06/2018 14:15  Hemoglobin A1C Latest Ref Range: 4.0 - 5.6 % 6.4 (A)   Review of Glycemic Control  Diabetes history: DM2 Outpatient Diabetes medications: Lantus 20 units QHS Current orders for Inpatient glycemic control: Lantus 20 units QHS, Novolog 0-15 units TID with meals, Novolog 0-5 units QHS  Inpatient Diabetes Program Recommendations:  HgbA1C: A1C in process.  NOTE: Noted consult for Diabetes Coordinator. Diabetes Coordinator is not on campus over the weekend but available by pager from 8am to 5pm for questions or concerns. Chart reviewed. Will follow while inpatient. No recommendations at this time.  Thanks, Barnie Alderman, RN, MSN, CDE Diabetes Coordinator Inpatient Diabetes Program [phone] (Team Pager from 8am to 5pm)

## 2018-06-06 NOTE — ED Notes (Signed)
Report called  

## 2018-06-06 NOTE — Plan of Care (Signed)
  Problem: Education: Goal: Knowledge of General Education information will improve Description: Including pain rating scale, medication(s)/side effects and non-pharmacologic comfort measures Outcome: Progressing   Problem: Nutrition: Goal: Adequate nutrition will be maintained Outcome: Progressing   

## 2018-06-06 NOTE — Progress Notes (Signed)
Received notification of pt PCR positive for MRSA. Notified/educated pt on course of treatment and need for contact isolation.

## 2018-06-06 NOTE — Progress Notes (Signed)
Pt refused to go to MRI earlier today. Did call MRI to request second attempt. MRI dept to call as soon as next available slot is open. Educated pt on importance of this test. He is in agreement to go when transport comes.

## 2018-06-07 ENCOUNTER — Inpatient Hospital Stay (HOSPITAL_COMMUNITY): Payer: Medicare Other

## 2018-06-07 DIAGNOSIS — E1142 Type 2 diabetes mellitus with diabetic polyneuropathy: Secondary | ICD-10-CM

## 2018-06-07 DIAGNOSIS — L97929 Non-pressure chronic ulcer of unspecified part of left lower leg with unspecified severity: Secondary | ICD-10-CM

## 2018-06-07 DIAGNOSIS — I87332 Chronic venous hypertension (idiopathic) with ulcer and inflammation of left lower extremity: Secondary | ICD-10-CM

## 2018-06-07 LAB — CBC
HCT: 35.8 % — ABNORMAL LOW (ref 39.0–52.0)
Hemoglobin: 11.2 g/dL — ABNORMAL LOW (ref 13.0–17.0)
MCH: 26 pg (ref 26.0–34.0)
MCHC: 31.3 g/dL (ref 30.0–36.0)
MCV: 83.1 fL (ref 80.0–100.0)
Platelets: 216 10*3/uL (ref 150–400)
RBC: 4.31 MIL/uL (ref 4.22–5.81)
RDW: 13 % (ref 11.5–15.5)
WBC: 4.8 10*3/uL (ref 4.0–10.5)
nRBC: 0 % (ref 0.0–0.2)

## 2018-06-07 LAB — BASIC METABOLIC PANEL
Anion gap: 6 (ref 5–15)
BUN: 21 mg/dL — ABNORMAL HIGH (ref 6–20)
CO2: 24 mmol/L (ref 22–32)
Calcium: 8.9 mg/dL (ref 8.9–10.3)
Chloride: 106 mmol/L (ref 98–111)
Creatinine, Ser: 1.89 mg/dL — ABNORMAL HIGH (ref 0.61–1.24)
GFR calc Af Amer: 46 mL/min — ABNORMAL LOW (ref >60)
GFR calc non Af Amer: 40 mL/min — ABNORMAL LOW (ref >60)
Glucose, Bld: 127 mg/dL — ABNORMAL HIGH (ref 70–99)
Potassium: 4 mmol/L (ref 3.5–5.1)
Sodium: 136 mmol/L (ref 135–145)

## 2018-06-07 LAB — HIV 1/2 AB DIFFERENTIATION
HIV 1 Ab: POSITIVE — AB
HIV 2 Ab: NEGATIVE

## 2018-06-07 LAB — GLUCOSE, CAPILLARY
Glucose-Capillary: 174 mg/dL — ABNORMAL HIGH (ref 70–99)
Glucose-Capillary: 142 mg/dL — ABNORMAL HIGH (ref 70–99)
Glucose-Capillary: 149 mg/dL — ABNORMAL HIGH (ref 70–99)
Glucose-Capillary: 166 mg/dL — ABNORMAL HIGH (ref 70–99)

## 2018-06-07 LAB — HIV ANTIBODY (ROUTINE TESTING W REFLEX): HIV Screen 4th Generation wRfx: REACTIVE — AB

## 2018-06-07 MED ORDER — AMOXICILLIN-POT CLAVULANATE 875-125 MG PO TABS
1.0000 | ORAL_TABLET | Freq: Two times a day (BID) | ORAL | Status: DC
  Administered 2018-06-07 – 2018-06-08 (×3): 1 via ORAL
  Filled 2018-06-07 (×3): qty 1

## 2018-06-07 NOTE — Consult Note (Signed)
ORTHOPAEDIC CONSULTATION  REQUESTING PHYSICIAN: Kayleen Memos, DO  Chief Complaint: Left leg ulcer.  HPI: Keith [name] is a 53 y.o. male who presents with left leg ulcer.  Patient states he feels like it is getting better.  Patient missed his appointment in the office last week he was undergoing serial compression wraps.  Patient has uncontrolled type 2 diabetes with a venous stasis ulcer posterior lateral aspect of the left leg.  Past Medical History:  Diagnosis Date  . Abscess of right foot   . AIDS ([place])   . Chronic knee pain    right  . Chronic pain   . Diabetes type 2, uncontrolled ([place])    HgA1c 17.6 (04/27/2010)  . Diabetic foot ulcer ([place]) 01/2017   right foot  . Erectile dysfunction   . Genital warts   . HIV (human immunodeficiency virus infection) ([place]) 2009   CD4 count 100, VL 13800 (05/01/2010)  . Hypertension   . Osteomyelitis ([place])    h/o hand  . Osteomyelitis of metatarsal ([place]) 04/28/2017   Past Surgical History:  Procedure Laterality Date  . AMPUTATION Right 05/02/2017   Procedure: AMPUTATION TRANSMETARSAL;  Surgeon: [name], MD;  Location: [facility];  Service: Orthopedics;  Laterality: Right;  . AMPUTATION Right 06/13/2017   Procedure: RIGHT BELOW KNEE AMPUTATION;  Surgeon: [name], MD;  Location: [facility];  Service: Orthopedics;  Laterality: Right;  . BELOW KNEE LEG AMPUTATION Right 06/13/2017  . HERNIA REPAIR    . I&D EXTREMITY Left 08/21/2014   Procedure: INCISION AND DRAINAGE LEFT SMALL FINGER;  Surgeon: Leanora Cover, MD;  Location: [facility];  Service: Orthopedics;  Laterality: Left;  . I&D EXTREMITY Right 03/18/2017   Procedure: IRRIGATION AND DEBRIDEMENT EXTREMITY;  Surgeon: [name], MD;  Location: [facility];  Service: Orthopedics;  Laterality: Right;  . MINOR IRRIGATION AND DEBRIDEMENT OF WOUND Right 04/22/2014   Procedure: IRRIGATION AND DEBRIDEMENT OF RIGHT NECK ABCESS;  Surgeon: Jerrell Belfast, MD;  Location: [facility];  Service: ENT;   Laterality: Right;  . MULTIPLE EXTRACTIONS WITH ALVEOLOPLASTY N/A 01/18/2013   Procedure: MULTIPLE EXTRACION 3, 6, 7, 10, 11, 13, 21, 22, 27, 28, 29, 30 WITH ALVEOLOPLASTY;  Surgeon: Gae Bon, DDS;  Location: [facility];  Service: Oral Surgery;  Laterality: N/A;  . SKIN SPLIT GRAFT Right 03/21/2017   Procedure: IRRIGATION AND DEBRIDEMENT RIGHT FOOT AND APPLY SPLIT THICKNESS SKIN GRAFT AND WOUND VAC;  Surgeon: [name], MD;  Location: [facility];  Service: Orthopedics;  Laterality: Right;  . TEE WITHOUT CARDIOVERSION N/A 05/02/2017   Procedure: TRANSESOPHAGEAL ECHOCARDIOGRAM (TEE);  Surgeon: Acie Fredrickson Wonda Cheng, MD;  Location: [facility];  Service: Cardiovascular;  Laterality: N/A;  coincidental to orthopedic case   Social History   Socioeconomic History  . Marital status: Single    Spouse name: Not on file  . Number of children: 3  . Years of education: 34  . Highest education level: Not on file  Occupational History  . Occupation: disabled  Social Needs  . Financial resource strain: Not on file  . Food insecurity:    Worry: Not on file    Inability: Not on file  . Transportation needs:    Medical: Not on file    Non-medical: Not on file  Tobacco Use  . Smoking status: Never Smoker  . Smokeless tobacco: Never Used  Substance and Sexual Activity  . Alcohol use: No    Alcohol/week: 0.0 standard drinks  .  Drug use: Not Currently    Comment: OTC ASA  . Sexual activity: Yes    Partners: Female    Comment: given condoms  Lifestyle  . Physical activity:    Days per week: Not on file    Minutes per session: Not on file  . Stress: Not on file  Relationships  . Social connections:    Talks on phone: Not on file    Gets together: Not on file    Attends religious service: Not on file    Active member of club or organization: Not on file    Attends meetings of clubs or organizations: Not on file    Relationship status: Not on file  Other Topics Concern  . Not on file  Social History  Narrative   Worked for the city of [place] for 18 years.   Unemployed.    Applying for disability.   Medicaid patient.   Family History  Problem Relation Age of Onset  . Hypertension Mother   . Arthritis Father   . Hypertension Father   . Hypertension Brother   . Cancer Maternal Grandmother 41       unknown type of cancer  . Depression Paternal Grandmother    - negative except otherwise stated in the family history section Allergies  Allergen Reactions  . Sulfur Itching    Patient stated he's allergic to "sulfur" AND "sulfa"  . Sulfa Antibiotics Itching   Prior to Admission medications   Medication Sig Start Date End Date Taking? Authorizing Provider  Insulin Glargine (LANTUS SOLOSTAR) 100 UNIT/ML Solostar Pen Inject 20 Units into the skin daily at 10 pm. Patient taking differently: Inject 20 Units into the skin at bedtime.  01/19/18  Yes [name], FNP  PREZCOBIX 800-150 MG tablet TAKE 1 TABLET BY MOUTH DAILY. SWALLOW WHOLE. DO NOT CRUSH, BREAK OR CHEW TABLETS. TAKE WITH FOOD. Patient taking differently: Take 1 tablet by mouth daily.  03/27/18  Yes [name], MD  tadalafil (ADCIRCA/CIALIS) 20 MG tablet Take 0.5-1 tablets (10-20 mg total) by mouth every other day as needed for erectile dysfunction. 11/05/17  Yes [name], FNP  TRIUMEQ 600-50-300 MG tablet TAKE 1 TABLET BY MOUTH DAILY. Patient taking differently: Take 1 tablet by mouth daily. May take with or without food. 03/27/18  Yes [name], MD  amLODipine (NORVASC) 10 MG tablet Take 1 tablet (10 mg total) by mouth daily. Patient not taking: Reported on 06/06/2018 01/17/17   Nita Sells, MD  glucose blood (COOL BLOOD GLUCOSE TEST STRIPS) test strip Use as instructed 01/05/18   [name], FNP  Insulin Pen Needle (B-D UF III MINI PEN NEEDLES) 31G X 5 MM MISC 20 Units by Does not apply route as directed. 01/19/18   [name], FNP  Insulin Syringes, Disposable, U-100 0.3  ML MISC 1 Syringe by Does not apply route 3 (three) times daily. 01/05/18   [name], FNP  Lancets [facility] ULTRASOFT) lancets Use as instructed 01/05/18   [name], FNP   Dg Tibia/fibula Left  Result Date: 06/06/2018 CLINICAL DATA:  53 year old male with open losing wound on the lateral aspect of the left lower extremity. EXAM: LEFT TIBIA AND FIBULA - 2 VIEW COMPARISON:  Left knee radiograph dated 10/24/2009 FINDINGS: There is no acute fracture or dislocation. The bones are well mineralized. No significant arthritic changes. No joint effusion. Mild diffuse subcutaneous edema. No soft tissue gas. IMPRESSION: 1. No acute/traumatic osseous pathology. 2.  No soft tissue gas. Electronically Signed   By: Anner Crete M.D.   On: 06/06/2018 05:58   Mr Tibia Fibula Left Wo Contrast  Result Date: 06/06/2018 CLINICAL DATA:  Open wound along the lateral aspect of the left leg. EXAM: MRI OF LOWER LEFT EXTREMITY WITHOUT CONTRAST TECHNIQUE: Multiplanar, multisequence MR imaging of the left lower extremity was performed. No intravenous contrast was administered. COMPARISON:  Radiographs 06/06/2018 FINDINGS: There is an open wound noted along the lateral aspect of the left lower extremity in the proximal calf region. There is underlying mild subcutaneous edema suggesting focal cellulitis. I do not see any findings suspicious for myofasciitis or pyomyositis. No discrete fluid collection to suggest a drainable abscess. No findings for septic arthritis or osteomyelitis. IMPRESSION: Upper left lateral calf ulceration/wound but no findings for drainable soft tissue abscess, myofasciitis, pyomyositis, septic arthritis or osteomyelitis. Electronically Signed   By: Marijo Sanes M.D.   On: 06/06/2018 20:53   - pertinent xrays, CT, MRI studies were reviewed and independently interpreted  Positive ROS: All other systems have been reviewed and were otherwise negative with the exception of those mentioned in  the HPI and as above.  Physical Exam: General: Alert, no acute distress Psychiatric: Patient is competent for consent with normal mood and affect Lymphatic: No axillary or cervical lymphadenopathy Cardiovascular: No pedal edema Respiratory: No cyanosis, no use of accessory musculature GI: No organomegaly, abdomen is soft and non-tender    Images:  @ENCIMAGES @  Labs:  Lab Results  Component Value Date   HGBA1C 7.8 (H) 06/06/2018   HGBA1C 6.4 (A) 01/06/2018   HGBA1C 6.4 (A) 11/04/2017   ESRSEDRATE 74 (H) 06/06/2018   ESRSEDRATE 140 (H) 04/28/2017   ESRSEDRATE 127 (H) 03/15/2017   CRP <0.8 06/06/2018   CRP 14.9 (H) 04/28/2017   CRP 23.1 (H) 03/15/2017   LABURIC 6.3 08/07/2009   REPTSTATUS 05/08/2017 FINAL 05/03/2017   GRAMSTAIN  04/29/2017    NO WBC SEEN RARE GRAM POSITIVE COCCI IN PAIRS IN SINGLES    CULT NO GROWTH 5 DAYS 05/03/2017   LABORGA METHICILLIN RESISTANT STAPHYLOCOCCUS AUREUS 04/29/2017    Lab Results  Component Value Date   ALBUMIN 3.6 06/05/2018   ALBUMIN 3.6 08/19/2017   ALBUMIN 3.0 (L) 04/28/2017   PREALBUMIN 25.5 06/06/2018   PREALBUMIN 7.4 (L) 03/15/2017   LABURIC 6.3 08/07/2009    Neurologic: Patient does not have protective sensation bilateral lower extremities.   MUSCULOSKELETAL:   Skin: Examination the ulcer has improved granulation tissue in the wound bed.  2 satellite lesions have completely epithelialized.  There is hyper granulation tissue in the wound bed.  The wound is about 5 cm in diameter.  Patient has a strong dorsalis pedis pulse no clinical signs of arterial insufficiency.  No depth to the wound no purulent drainage no ascending cellulitis.  Review of the MRI scan shows no abscess no fasciitis no osteomyelitis. Assessment: Assessment: Improving venous stasis ulcer lateral left leg with MRI findings not showing any deep abscess or osteomyelitis or fasciitis.  Plan: Plan: Agree with a few days of IV antibiotics, dressing  changes, I will follow-up in the office in 1 week to start him in the medical compression stockings.  Thank you for the consult and the opportunity to see Mr. Kamuela Magos, [place] [phone] 9:23 AM

## 2018-06-07 NOTE — Progress Notes (Signed)
ID PROGRESS NOTE  Afebrile, appears improving with compression dressing for his venous stasis ulcer  A/P: continue on iv abtx while hospitalized, can transition to amox/clav x 5 more days Follow up with dr duda  Will sign off.

## 2018-06-07 NOTE — Progress Notes (Signed)
Pt c/o of pain, refused toradol and tramadol and stated he wanted 10 mg oxycodone. Pt informed that the only prn oder for pain was toradol and tramadol per MD order

## 2018-06-07 NOTE — Progress Notes (Signed)
PROGRESS NOTE  Keith [name] BCW:[id number] DOB: October 19, 1965 DOA: 06/05/2018 PCP: [name], FNP  HPI/Recap of past 24 hours: Keith [name] is a 53 y.o. male with medical history significant of Osteomyelitis, Hypertension, HIV, Genital Warts, Erectile Dysfunction, Diabetes, Diabetic Foot Ulcer, Chronic Pain, Chronic Knee Pain, AIDS, and Right BKA presenting with LE wound. He has an ulcer and has been trying to work on it.  He hasn't been wearing a compression stocking.  The ulcer has been present for about 3 weeks.  Dr. Jess Barters office has been "putting honey on it."  No fevers.  He is feeling well otherwise.  It started smelling and was hurting.  He would like to be referred to pain management - has been referred to [place] in the past and that is too far to drive.  He was last seen by [name] on 1/6 for this issue, indicating that it has been present significantly longer than the 3 weeks reported by the patient.  06/07/18: Patient seen and examined at his bedside.  No acute events overnight.  Reports significant pain in his left lower extremity.  Pain management and bowel regimen in place.  Consulted orthopedic surgery, [name] will see in consultation.   Assessment/Plan: Principal Problem:   Wound infection Active Problems:   Essential hypertension   Chronic pain   Hyperlipidemia   Insulin-requiring or dependent type II diabetes mellitus (HCC)   HIV disease (HCC)   CKD (chronic kidney disease) stage 3, GFR 30-59 ml/min (HCC)  Left lower extremity wound infection Negative x-ray Good dorsalis pedis pulse Currently on broad-spectrum IV antibiotics IV vancomycin, IV Flagyl, and Rocephin Pain management and bowel regimen in place Infectious disease following Orthopedic surgery consulted, [name] will see in consultation Follow recommendations per orthopedic surgery Positive MRSA screening Blood cultures x2 in process Afebrile with no  leukocytosis Obtain CBC in the morning  AKI on CKD 3 Baseline creatinine appears to be 1.37 with GFR 53 Presented with creatinine of 1.68 Creatinine today 06/07/2018 is 1.89 1 nephrotoxic agents/dehydration/hypotension Repeat BMP in the morning Continue gentle IV fluid hydration normal saline at 75 cc/h Monitor urine output  HIV -He was last seen by Dr. [name] in 6/19.   -At that time, his viral load was undetectable -CD4 count in 1/19 was 80 -Recheck CD4 and viral load -He was due for RTC in 10/19 - there is concern for suboptimal compliance at this time -Will request ID consult -Continue Prezcobix, Triumeq  DM -A1c in 10/19 was 6.4 -Will check A1c -Continue Lantus -Cover with moderate-scale SSI  HTN -Patient apparently stopped taking Norvasc -However, he appears to need resumption at this time -Restart Norvasc 10 mg PO daily  Chronic pain -He was seen at [facility] in Dec 2019 and left prior to completion of evaluation when he learned that they would not be prescribing opiate medications. -He has a DOC record including possession of schedule II substance and multiple counts of obtaining controlled substance by fraud. --I have reviewed this patient in the [place] Controlled Substances Reporting System.  He has not received a prescription for controlled substances since 10/19. -However, he remains at extremely high risk of opioid and also increased risk of misuse, diversion, or overdose. -He requests referral to a local pain clinic at the time of discharge -Will try to control pain with use of Toradol and Tramadol at this time    DVT prophylaxis:  Lovenox  Code Status:  Full -  confirmed with patient Family Communication: None present  Disposition Plan:  Home once clinically improved Consults called: ID; CM; DM coordinator; peripheral vascular navigator; SW; wound care; nutrition, orthopedic surgery.     Objective: Vitals:   06/06/18 0715 06/06/18 0809  06/06/18 1757 06/06/18 2030  BP: (!) 153/104 (!) 133/91 127/80 (!) 145/99  Pulse: 85 86 77 71  Resp:  17 18 18   Temp:  98.3 F (36.8 C) 98.8 F (37.1 C) 98.5 F (36.9 C)  TempSrc:  Oral Oral Oral  SpO2: 98% 100% 100% 100%  Weight:  120.3 kg    Height:  5\' 9"  (1.753 m)      Intake/Output Summary (Last 24 hours) at 06/07/2018 8295 Last data filed at 06/07/2018 0600 Gross per 24 hour  Intake 1365.34 ml  Output 1380 ml  Net -14.66 ml   Filed Weights   06/06/18 0809  Weight: 120.3 kg    Exam:  . General: 53 y.o. year-old male well developed well nourished in no acute distress.  Alert and oriented x3. . Cardiovascular: Regular rate and rhythm with no rubs or gallops.  No thyromegaly or JVD noted.   Marland Kitchen Respiratory: Clear to auscultation with no wheezes or rales. Good inspiratory effort. . Abdomen: Soft nontender nondistended with normal bowel sounds x4 quadrants. . Skin: Left lower extremity wound, pink appearing, partially healing.  Left lower extremity dorsalis pedis pulses noted 2 out of 4. . Psychiatry: Mood is appropriate for condition and setting   Data Reviewed: CBC: Recent Labs  Lab 06/05/18 2233 06/07/18 0410  WBC 5.6 4.8  NEUTROABS 3.0  --   HGB 11.9* 11.2*  HCT 38.7* 35.8*  MCV 85.2 83.1  PLT 254 621   Basic Metabolic Panel: Recent Labs  Lab 06/05/18 2233 06/07/18 0410  NA 139 136  K 4.2 4.0  CL 107 106  CO2 25 24  GLUCOSE 150* 127*  BUN 22* 21*  CREATININE 1.68* 1.89*  CALCIUM 9.5 8.9   GFR: Estimated Creatinine Clearance: 57.9 mL/min (A) (by C-G formula based on SCr of 1.89 mg/dL (H)). Liver Function Tests: Recent Labs  Lab 06/05/18 2233  AST 28  ALT 25  ALKPHOS 72  BILITOT 0.2*  PROT 8.4*  ALBUMIN 3.6   No results for input(s): LIPASE, AMYLASE in the last 168 hours. No results for input(s): AMMONIA in the last 168 hours. Coagulation Profile: No results for input(s): INR, PROTIME in the last 168 hours. Cardiac Enzymes: No results for  input(s): CKTOTAL, CKMB, CKMBINDEX, TROPONINI in the last 168 hours. BNP (last 3 results) No results for input(s): PROBNP in the last 8760 hours. HbA1C: Recent Labs    06/06/18 0938  HGBA1C 7.8*   CBG: Recent Labs  Lab 06/06/18 0823 06/06/18 1236 06/06/18 1640 06/06/18 2028 06/07/18 0846  GLUCAP 102* 107* 163* 144* 174*   Lipid Profile: No results for input(s): CHOL, HDL, LDLCALC, TRIG, CHOLHDL, LDLDIRECT in the last 72 hours. Thyroid Function Tests: No results for input(s): TSH, T4TOTAL, FREET4, T3FREE, THYROIDAB in the last 72 hours. Anemia Panel: No results for input(s): VITAMINB12, FOLATE, FERRITIN, TIBC, IRON, RETICCTPCT in the last 72 hours. Urine analysis:    Component Value Date/Time   COLORURINE YELLOW 04/29/2017 0438   APPEARANCEUR HAZY (A) 04/29/2017 0438   LABSPEC 1.018 04/29/2017 0438   PHURINE 5.0 04/29/2017 0438   GLUCOSEU NEGATIVE 04/29/2017 0438   HGBUR SMALL (A) 04/29/2017 0438   BILIRUBINUR negative 11/04/2017 1041   KETONESUR negative 11/04/2017 1041   Yell  04/29/2017 0438   PROTEINUR =100 (A) 11/04/2017 1041   PROTEINUR 100 (A) 04/29/2017 0438   UROBILINOGEN 1.0 11/04/2017 1041   UROBILINOGEN 1.0 01/01/2017 1032   NITRITE Negative 11/04/2017 1041   NITRITE NEGATIVE 04/29/2017 0438   LEUKOCYTESUR Negative 11/04/2017 1041   Sepsis Labs: @LABRCNTIP (procalcitonin:4,lacticidven:4)  ) Recent Results (from the past 240 hour(s))  MRSA PCR Screening     Status: Abnormal   Collection Time: 06/06/18  9:42 AM  Result Value Ref Range Status   MRSA by PCR POSITIVE (A) NEGATIVE Final    Comment:        The GeneXpert MRSA Assay (FDA approved for NASAL specimens only), is one component of a comprehensive MRSA colonization surveillance program. It is not intended to diagnose MRSA infection nor to guide or monitor treatment for MRSA infections. CRITICAL RESULT CALLED TO, READ BACK BY AND VERIFIED WITH: RN CAROL MORRISON [phone]  FCP Performed at [facility], [place] [address], [place], [place] [address]       Studies: Mr Tibia Fibula Left Wo Contrast  Result Date: 06/06/2018 CLINICAL DATA:  Open wound along the lateral aspect of the left leg. EXAM: MRI OF LOWER LEFT EXTREMITY WITHOUT CONTRAST TECHNIQUE: Multiplanar, multisequence MR imaging of the left lower extremity was performed. No intravenous contrast was administered. COMPARISON:  Radiographs 06/06/2018 FINDINGS: There is an open wound noted along the lateral aspect of the left lower extremity in the proximal calf region. There is underlying mild subcutaneous edema suggesting focal cellulitis. I do not see any findings suspicious for myofasciitis or pyomyositis. No discrete fluid collection to suggest a drainable abscess. No findings for septic arthritis or osteomyelitis. IMPRESSION: Upper left lateral calf ulceration/wound but no findings for drainable soft tissue abscess, myofasciitis, pyomyositis, septic arthritis or osteomyelitis. Electronically Signed   By: Marijo Sanes M.D.   On: 06/06/2018 20:53    Scheduled Meds: . abacavir-dolutegravir-lamiVUDine  1 tablet Oral Daily  . amLODipine  10 mg Oral Daily  . Chlorhexidine Gluconate Cloth  6 each Topical Q0600  . darunavir-cobicistat  1 tablet Oral Q breakfast  . docusate sodium  100 mg Oral BID  . enoxaparin (LOVENOX) injection  0.5 mg/kg Subcutaneous Q24H  . insulin aspart  0-15 Units Subcutaneous TID WC  . insulin aspart  0-5 Units Subcutaneous QHS  . insulin glargine  20 Units Subcutaneous QHS  . metroNIDAZOLE  500 mg Oral Q8H  . mupirocin ointment  1 application Nasal BID    Continuous Infusions: . sodium chloride 75 mL/hr at 06/06/18 1803  . cefTRIAXone (ROCEPHIN)  IV 2 g (06/06/18 1414)  . vancomycin 1,500 mg (06/06/18 1805)     LOS: 1 day     Kayleen Memos, MD [facility] Pager [phone]  If 7PM-7AM, please contact night-coverage [email or web address] Password [facility] 06/07/2018,  8:52 AM

## 2018-06-07 NOTE — Plan of Care (Signed)
  Problem: Pain Managment: °Goal: General experience of comfort will improve °Outcome: Not Progressing °  °Problem: Safety: °Goal: Ability to remain free from injury will improve °Outcome: Progressing °  °

## 2018-06-07 NOTE — Plan of Care (Signed)
  Problem: Pain Managment: Goal: General experience of comfort will improve Outcome: Progressing   

## 2018-06-07 NOTE — Consult Note (Signed)
[place] Nurse wound consult note Reason for Consult: LLE ulceration.  Improving.  [name] saw patient this morning and plans to follow up in office within 1 week of discharge for initiation of medical compression.  No role for WOC Nursing at this time. [place] nursing team will not follow, but will remain available to this patient, the nursing and medical teams.  Please re-consult if needed. Thanks, Maudie Flakes, MSN, RN, [place], Arther Abbott  Pager# [phone]

## 2018-06-08 ENCOUNTER — Encounter (HOSPITAL_COMMUNITY): Payer: Medicare Other

## 2018-06-08 DIAGNOSIS — G8929 Other chronic pain: Secondary | ICD-10-CM

## 2018-06-08 DIAGNOSIS — E785 Hyperlipidemia, unspecified: Secondary | ICD-10-CM

## 2018-06-08 DIAGNOSIS — L97929 Non-pressure chronic ulcer of unspecified part of left lower leg with unspecified severity: Secondary | ICD-10-CM

## 2018-06-08 DIAGNOSIS — N183 Chronic kidney disease, stage 3 (moderate): Secondary | ICD-10-CM

## 2018-06-08 DIAGNOSIS — I87332 Chronic venous hypertension (idiopathic) with ulcer and inflammation of left lower extremity: Secondary | ICD-10-CM

## 2018-06-08 LAB — GLUCOSE, CAPILLARY
Glucose-Capillary: 94 mg/dL (ref 70–99)
Glucose-Capillary: 146 mg/dL — ABNORMAL HIGH (ref 70–99)

## 2018-06-08 LAB — T-HELPER CELLS (CD4) COUNT (NOT AT ARMC)
CD4 % Helper T Cell: 6 % — ABNORMAL LOW (ref 33–55)
CD4 T Cell Abs: 140 /uL — ABNORMAL LOW (ref 400–2700)

## 2018-06-08 MED ORDER — IBUPROFEN 600 MG PO TABS: 600.0000 mg | ORAL_TABLET | Freq: Four times a day (QID) | ORAL | 0 refills | Status: DC | PRN

## 2018-06-08 MED ORDER — PRO-STAT SUGAR FREE PO LIQD: 30.0000 mL | Freq: Three times a day (TID) | ORAL | Status: DC

## 2018-06-08 MED ORDER — GLUCERNA SHAKE PO LIQD: 237.0000 mL | Freq: Two times a day (BID) | ORAL | Status: DC

## 2018-06-08 MED ORDER — AMOXICILLIN-POT CLAVULANATE 875-125 MG PO TABS: 1.0000 | ORAL_TABLET | Freq: Two times a day (BID) | ORAL | 0 refills | Status: DC

## 2018-06-08 NOTE — Plan of Care (Signed)
  Problem: Education: Goal: Knowledge of General Education information will improve Description Including pain rating scale, medication(s)/side effects and non-pharmacologic comfort measures Outcome: Progressing   Problem: Clinical Measurements: Goal: Ability to maintain clinical measurements within normal limits will improve Outcome: Progressing Goal: Will remain free from infection Outcome: Progressing Goal: Cardiovascular complication will be avoided Outcome: Progressing   Problem: Activity: Goal: Risk for activity intolerance will decrease Outcome: Progressing   Problem: Coping: Goal: Level of anxiety will decrease Outcome: Progressing

## 2018-06-08 NOTE — Progress Notes (Signed)
Pt alert, able to make needs known. Report received from night shift. Pt on phone, smiling and joking. Continue to monitor.

## 2018-06-08 NOTE — Progress Notes (Signed)
In to complete pt wound care. Pt had removed bandage and given his own bed bath. Wound base is red and beefy looking, however, it is higher than the wound edges. Treatment completed, continue to monitor.

## 2018-06-08 NOTE — Discharge Summary (Signed)
Physician Discharge Summary  Keith [name] IWL:[id number] DOB: 06-21-1965 DOA: 06/05/2018  PCP: [name], FNP  Admit date: 06/05/2018 Discharge date: 06/08/2018  Admitted From: Home Disposition: Home   Recommendations for Outpatient Follow-up:  1. Follow up with PCP in 1-2 weeks 2. Follow up with [place], [name] for ongoing wound care management. 3. Continue substance use cessation counseling. No controlled substances provided at discharge.  Home Health: None Equipment/Devices: Wound dressing supplies Discharge Condition: Stable, improved CODE STATUS: Full Diet recommendation: Carb-modified  Brief/Interim Summary: Keith [name] is a 53 y.o. male with a history of Osteomyelitis, Hypertension, HIV, Genital Warts, Erectile Dysfunction, Diabetes, Diabetic Foot Ulcer, Chronic Pain, Chronic Knee Pain, AIDS, and Right BKAwho presented with LE wound nonhealing that developed malodorous discharge and increasing pain. He stated this began 3 weeks prior, but has been seen for this complaint since early Jan 2020.  ID and orthopedics were consulted, recommended compression dressing and IV antibiotics. With this, clinically has improved and felt to be stable for discharge on 5 more days of augmentin per ID recommendations. Will be following up with [name] as well. The patient repeatedly requested narcotic pain medications. Due to the nature of this wound, these were not felt to be indicated and were not prescribed at discharge.  Discharge Diagnoses:  Principal Problem:   Wound infection Active Problems:   Essential hypertension   Chronic pain   Hyperlipidemia   Insulin-requiring or dependent type II diabetes mellitus (HCC)   HIV disease (HCC)   CKD (chronic kidney disease) stage 3, GFR 30-59 ml/min (HCC)   Idiopathic chronic venous hypertension of left lower extremity with ulcer and inflammation (HCC)  Left lower extremity venous stasis ulcer, wound infection: Negative  x-ray, good arterial flow based on exam. No deep abscess, osteo, or fasciitis on cross sectional imaging. - Improved granulation tissue at discharge. Orthopedics has recommended outpatient follow up in 1 week to begin medical compression stockings. - Initially on IV vancomycin, IV Flagyl, and Rocephin > converted to augmentin per ID recommendations.  - Blood cultures NGTD at discharge.  AKI on CKD 3: - Repeat BMP at follow up. Patient is having normal UOP and taking a better amount of fluids.  HIV: He was last seen by Dr. [name] in 6/19. At that time, his viral load was undetectable - ID evaluated the patient, will continue ART and strongly encourage outpatient follow up.  - He was due for RTC in 10/19 - there is concern for suboptimal compliance at this time  DM: HbA1c 7.4%.  - Continue home medications.   HTN - Restart home norvasc  Chronic pain: He was seen at [facility] in Dec 2019 and left prior to completion of evaluation when he learned that they would not be prescribing opiate medications. He has a DOC record including possession of schedule II substance and multiple counts of obtaining controlled substance by fraud. - No controlled substances prescribed at discharge. - He would like to be referred to pain management. He has been referred to [place] in the past and that is too far to drive. Defer to PCP for further referral.  Obesity: BMI 39 - Weight loss recommended  Discharge Instructions Discharge Instructions    Diet - low sodium heart healthy   Complete by:  As directed    Discharge instructions   Complete by:  As directed    You were admitted for a lower extremity wound. Infectious disease and orthopedics doctors have  evaluated you and recommended continuing wound care, continuing antibiotics, and following up with [name] in 1 week. Please call the office to schedule an appointment. In the meantime, take augmentin twice daily for  the infection. You may take ibuprofen and tylenol as needed for pain. No other pain medications are prescribed at discharge. Please follow up with your doctor for a referral to pain management clinic for further treatment of chronic pain. Seek medical attention if you develop fever, the wound begins to have a foul odor, or you notice pus or spreading redness around the wound.   Increase activity slowly   Complete by:  As directed      Allergies as of 06/08/2018      Reactions   Sulfur Itching   Patient stated he's allergic to "sulfur" AND "sulfa"   Sulfa Antibiotics Itching      Medication List    TAKE these medications   amLODipine 10 MG tablet Commonly known as:  NORVASC Take 1 tablet (10 mg total) by mouth daily.   amoxicillin-clavulanate 875-125 MG tablet Commonly known as:  AUGMENTIN Take 1 tablet by mouth every 12 (twelve) hours.   glucose blood test strip Commonly known as:  COOL BLOOD GLUCOSE TEST STRIPS Use as instructed   ibuprofen 600 MG tablet Commonly known as:  ADVIL,MOTRIN Take 1 tablet (600 mg total) by mouth every 6 (six) hours as needed.   Insulin Glargine 100 UNIT/ML Solostar Pen Commonly known as:  LANTUS SOLOSTAR Inject 20 Units into the skin daily at 10 pm. What changed:  when to take this   Insulin Pen Needle 31G X 5 MM Misc Commonly known as:  B-D UF III MINI PEN NEEDLES 20 Units by Does not apply route as directed.   Insulin Syringes (Disposable) U-100 0.3 ML Misc 1 Syringe by Does not apply route 3 (three) times daily.   onetouch ultrasoft lancets Use as instructed   PREZCOBIX 800-150 MG tablet Generic drug:  darunavir-cobicistat TAKE 1 TABLET BY MOUTH DAILY. SWALLOW WHOLE. DO NOT CRUSH, BREAK OR CHEW TABLETS. TAKE WITH FOOD. What changed:  See the new instructions.   tadalafil 20 MG tablet Commonly known as:  ADCIRCA/CIALIS Take 0.5-1 tablets (10-20 mg total) by mouth every other day as needed for erectile dysfunction.   TRIUMEQ  600-50-300 MG tablet Generic drug:  abacavir-dolutegravir-lamiVUDine TAKE 1 TABLET BY MOUTH DAILY. What changed:  additional instructions      Follow-up Information    [name], MD Follow up in 1 week(s).   Specialty:  Orthopedic Surgery Contact information: [place] [address] [phone]        [name], FNP Follow up.   Specialty:  Family Medicine Contact information: [address] [place] [address] [phone]          Allergies  Allergen Reactions  . Sulfur Itching    Patient stated he's allergic to "sulfur" AND "sulfa"  . Sulfa Antibiotics Itching    Consultations:  ID  Orthopedics  Procedures/Studies: Dg Tibia/fibula Left  Result Date: 06/06/2018 CLINICAL DATA:  53 year old male with open losing wound on the lateral aspect of the left lower extremity. EXAM: LEFT TIBIA AND FIBULA - 2 VIEW COMPARISON:  Left knee radiograph dated 10/24/2009 FINDINGS: There is no acute fracture or dislocation. The bones are well mineralized. No significant arthritic changes. No joint effusion. Mild diffuse subcutaneous edema. No soft tissue gas. IMPRESSION: 1. No acute/traumatic osseous pathology. 2. No soft tissue gas. Electronically  Signed   By: Anner Crete M.D.   On: 06/06/2018 05:58   Mr Tibia Fibula Left Wo Contrast  Result Date: 06/06/2018 CLINICAL DATA:  Open wound along the lateral aspect of the left leg. EXAM: MRI OF LOWER LEFT EXTREMITY WITHOUT CONTRAST TECHNIQUE: Multiplanar, multisequence MR imaging of the left lower extremity was performed. No intravenous contrast was administered. COMPARISON:  Radiographs 06/06/2018 FINDINGS: There is an open wound noted along the lateral aspect of the left lower extremity in the proximal calf region. There is underlying mild subcutaneous edema suggesting focal cellulitis. I do not see any findings suspicious for myofasciitis or pyomyositis. No discrete fluid collection to  suggest a drainable abscess. No findings for septic arthritis or osteomyelitis. IMPRESSION: Upper left lateral calf ulceration/wound but no findings for drainable soft tissue abscess, myofasciitis, pyomyositis, septic arthritis or osteomyelitis. Electronically Signed   By: Marijo Sanes M.D.   On: 06/06/2018 20:53     Subjective: Afebrile, VSS, wound looking better. Still with pain, requests oxycodone 10mg .  Discharge Exam: Vitals:   06/08/18 0506 06/08/18 0954  BP: (!) 147/98 (!) 142/99  Pulse: 79 78  Resp: 18 17  Temp:  (!) 97.3 F (36.3 C)  SpO2: 100% 98%   General: Pt is alert, awake, not in acute distress Cardiovascular: RRR, S1/S2 +, no rubs, no gallops Respiratory: CTA bilaterally, no wheezing, no rhonchi Abdominal: Soft, NT, ND, bowel sounds + Extremities: RLE with ~5cm annular wound with raised granulation tissue without significant slough, no odor or purulence. No surrounding erythema. DP pulses 2+ bilaterally.   Labs: BNP (last 3 results) No results for input(s): BNP in the last 8760 hours. Basic Metabolic Panel: Recent Labs  Lab 06/05/18 2233 06/07/18 0410  NA 139 136  K 4.2 4.0  CL 107 106  CO2 25 24  GLUCOSE 150* 127*  BUN 22* 21*  CREATININE 1.68* 1.89*  CALCIUM 9.5 8.9   Liver Function Tests: Recent Labs  Lab 06/05/18 2233  AST 28  ALT 25  ALKPHOS 72  BILITOT 0.2*  PROT 8.4*  ALBUMIN 3.6   No results for input(s): LIPASE, AMYLASE in the last 168 hours. No results for input(s): AMMONIA in the last 168 hours. CBC: Recent Labs  Lab 06/05/18 2233 06/07/18 0410  WBC 5.6 4.8  NEUTROABS 3.0  --   HGB 11.9* 11.2*  HCT 38.7* 35.8*  MCV 85.2 83.1  PLT 254 216   Cardiac Enzymes: No results for input(s): CKTOTAL, CKMB, CKMBINDEX, TROPONINI in the last 168 hours. BNP: Invalid input(s): POCBNP CBG: Recent Labs  Lab 06/07/18 1243 06/07/18 1744 06/07/18 2043 06/08/18 0814 06/08/18 1120  GLUCAP 142* 149* 166* 94 146*   D-Dimer No results  for input(s): DDIMER in the last 72 hours. Hgb A1c Recent Labs    06/06/18 0938  HGBA1C 7.8*   Lipid Profile No results for input(s): CHOL, HDL, LDLCALC, TRIG, CHOLHDL, LDLDIRECT in the last 72 hours. Thyroid function studies No results for input(s): TSH, T4TOTAL, T3FREE, THYROIDAB in the last 72 hours.  Invalid input(s): FREET3 Anemia work up No results for input(s): VITAMINB12, FOLATE, FERRITIN, TIBC, IRON, RETICCTPCT in the last 72 hours. Urinalysis    Component Value Date/Time   COLORURINE YELLOW 04/29/2017 0438   APPEARANCEUR HAZY (A) 04/29/2017 0438   LABSPEC 1.018 04/29/2017 0438   PHURINE 5.0 04/29/2017 0438   GLUCOSEU NEGATIVE 04/29/2017 0438   HGBUR SMALL (A) 04/29/2017 0438   BILIRUBINUR negative 11/04/2017 1041   KETONESUR negative 11/04/2017 1041  KETONESUR NEGATIVE 04/29/2017 0438   PROTEINUR =100 (A) 11/04/2017 1041   PROTEINUR 100 (A) 04/29/2017 0438   UROBILINOGEN 1.0 11/04/2017 1041   UROBILINOGEN 1.0 01/01/2017 1032   NITRITE Negative 11/04/2017 1041   NITRITE NEGATIVE 04/29/2017 0438   LEUKOCYTESUR Negative 11/04/2017 1041    Microbiology Recent Results (from the past 240 hour(s))  Blood culture (routine x 2)     Status: None (Preliminary result)   Collection Time: 06/06/18  4:53 AM  Result Value Ref Range Status   Specimen Description BLOOD RIGHT ARM  Final   Special Requests   Final    BOTTLES DRAWN AEROBIC AND ANAEROBIC Blood Culture adequate volume   Culture NO GROWTH 2 DAYS  Final   Report Status PENDING  Incomplete  Blood culture (routine x 2)     Status: None (Preliminary result)   Collection Time: 06/06/18  5:00 AM  Result Value Ref Range Status   Specimen Description BLOOD RIGHT HAND  Final   Special Requests   Final    BOTTLES DRAWN AEROBIC AND ANAEROBIC Blood Culture adequate volume   Culture NO GROWTH 2 DAYS  Final   Report Status PENDING  Incomplete  MRSA PCR Screening     Status: Abnormal   Collection Time: 06/06/18  9:42 AM   Result Value Ref Range Status   MRSA by PCR POSITIVE (A) NEGATIVE Final    Comment:        The GeneXpert MRSA Assay (FDA approved for NASAL specimens only), is one component of a comprehensive MRSA colonization surveillance program. It is not intended to diagnose MRSA infection nor to guide or monitor treatment for MRSA infections. CRITICAL RESULT CALLED TO, READ BACK BY AND VERIFIED WITH: RN CAROL MORRISON [phone] FCP Performed at [facility], [place] [address] [place], [place] [address]     Time coordinating discharge: Approximately 40 minutes  Patrecia Pour, MD  [facility] 06/08/2018, 1:11 PM Pager [phone]

## 2018-06-08 NOTE — Progress Notes (Signed)
Initial Nutrition Assessment  DOCUMENTATION CODES:   Obesity unspecified  INTERVENTION:  Glucerna BID (each provides 220 kcal, 10 g protein) ProStat TID (each provides 100 kcal, 15 g protein)   NUTRITION DIAGNOSIS:   Increased nutrient needs related to wound healing as evidenced by estimated needs.   GOAL:   Patient will meet greater than or equal to 90% of their needs   MONITOR:   PO intake, Supplement acceptance, Weight trends, Labs  REASON FOR ASSESSMENT:   Consult Wound healing  ASSESSMENT:   53 yo male, admitted with wound infection. PMH significant for R foot abcess, HIV, AIDS, DM type 2, diabetic foot ulcer, HTN, osteomyelitis of hand and metatarsal.  Labs: glucose 127 mg/dL, BUN and Creatinine elevated Meds: Colace, novolog, Lantus, Ultram  Pt sitting at edge of bed, bathing with washcloth. Denies nausea, vomiting, diarrhea or constipation. Endorses variable appetite. Typically eats 1 meal daily at home, depending on how hungry he feels. May have several days in a row with good PO intake, and then appetite declines again. Discussed importance of consistent intake for blood sugar control. Encouraged pt to include protein-rich foods with all meals and snacks, and to eat those foods first if not feeling hungry. Discussed increasing protein intake to support wound healing. Pt amenable to trying Glucerna BID and ProStat TID.   NUTRITION - FOCUSED PHYSICAL EXAM: Deferred - pt bathing at time of visit. Appear well nourished. Will need exam if present for follow up.  Diet Order:  No known food allergies Diet Order            Diet Carb Modified Fluid consistency: Thin; Room service appropriate? Yes  Diet effective now            PO Intake: 2/29-3/1: 75-100% x 2 meals recorded  EDUCATION NEEDS:  Education needs have been addressed  Skin:  Skin Assessment: Skin Integrity Issues: Skin Integrity Issues:: Diabetic Ulcer Diabetic Ulcer: R foot with purlent  drainage  Last BM:  2/29  Height:  Ht Readings from Last 1 Encounters:  06/06/18 5\' 9"  (1.753 m)    Weight:  Wt Readings from Last 10 Encounters:  06/06/18 120.3 kg  05/02/17 110.5 kg  03/24/17 117.1 kg  Limited recent wt hx Wt up ~10 kg x 1 year  Ideal Body Weight:  72.7 kg  BMI:  Body mass index is 39.17 kg/m. obese class 2  Estimated Nutritional Needs: calculated based on 2/29 wt (~120 kg)  Kcal:  1680-2160 (14-18 kcal/kg)  Protein:  >161 gm (>2.2 g/kg IBW)  Fluid:  1 mL/kcal or per MD  Althea Grimmer, MS, RDN, LDN Pager: [phone] Available Mondays and Fridays, 9am-2pm

## 2018-06-08 NOTE — Consult Note (Signed)
PV Navigator Consult Acknowledged  Admitted with wound infection to left lower leg full thickness venous ulcer. States he has been seeing Dr Sharol Given weekly in the office for application of [place] when he noticed last week that wound began to drain more and had a "bad smell". Positive dorsalis pedal pulse.  History of HTN, HIV, AIDS, DM, R BKA. Dr Sharol Given and wound care consults confirm wound improving with compression dressings and antibiotic therapy.   Dietitian following for nutritional support to promote healing.  Patient will need to schedule follow up appointment with Dr Sharol Given 1 week post op for wound care and medical compression evaluation. Denies any other needs or barriers at this time.   Will follow during hospitalization and post discharge home to confirm no barriers to scheduling appointments.  Thank you for this consult. Cletis Media RN BSN CWS [place]

## 2018-06-09 ENCOUNTER — Telehealth (HOSPITAL_COMMUNITY): Payer: Self-pay

## 2018-06-09 NOTE — Telephone Encounter (Signed)
Patient's brother answered mother's cell phone. Left a message and phone number for her to call back.

## 2018-06-09 NOTE — Telephone Encounter (Signed)
Phone not working at this time. Will try again later.

## 2018-06-11 DIAGNOSIS — Z4800 Encounter for change or removal of nonsurgical wound dressing: Secondary | ICD-10-CM | POA: Diagnosis not present

## 2018-06-11 DIAGNOSIS — E1142 Type 2 diabetes mellitus with diabetic polyneuropathy: Secondary | ICD-10-CM | POA: Diagnosis not present

## 2018-06-11 DIAGNOSIS — I872 Venous insufficiency (chronic) (peripheral): Secondary | ICD-10-CM | POA: Diagnosis not present

## 2018-06-11 DIAGNOSIS — Z794 Long term (current) use of insulin: Secondary | ICD-10-CM | POA: Diagnosis not present

## 2018-06-11 DIAGNOSIS — L97222 Non-pressure chronic ulcer of left calf with fat layer exposed: Secondary | ICD-10-CM | POA: Diagnosis not present

## 2018-06-11 DIAGNOSIS — E1151 Type 2 diabetes mellitus with diabetic peripheral angiopathy without gangrene: Secondary | ICD-10-CM | POA: Diagnosis not present

## 2018-06-11 LAB — CULTURE, BLOOD (ROUTINE X 2)
Culture: NO GROWTH
Culture: NO GROWTH
Special Requests: ADEQUATE
Special Requests: ADEQUATE

## 2018-06-15 ENCOUNTER — Telehealth (HOSPITAL_COMMUNITY): Payer: Self-pay

## 2018-06-15 ENCOUNTER — Ambulatory Visit (INDEPENDENT_AMBULATORY_CARE_PROVIDER_SITE_OTHER): Payer: Medicare Other | Admitting: Family Medicine

## 2018-06-15 ENCOUNTER — Encounter: Payer: Self-pay | Admitting: Family Medicine

## 2018-06-15 VITALS — BP 144/98 | HR 79 | Temp 98.0°F | Ht 69.0 in | Wt 274.0 lb

## 2018-06-15 DIAGNOSIS — Z4800 Encounter for change or removal of nonsurgical wound dressing: Secondary | ICD-10-CM | POA: Diagnosis not present

## 2018-06-15 DIAGNOSIS — M545 Low back pain, unspecified: Secondary | ICD-10-CM

## 2018-06-15 DIAGNOSIS — E119 Type 2 diabetes mellitus without complications: Secondary | ICD-10-CM | POA: Insufficient documentation

## 2018-06-15 DIAGNOSIS — I10 Essential (primary) hypertension: Secondary | ICD-10-CM | POA: Diagnosis not present

## 2018-06-15 DIAGNOSIS — S81802D Unspecified open wound, left lower leg, subsequent encounter: Secondary | ICD-10-CM | POA: Diagnosis not present

## 2018-06-15 DIAGNOSIS — S81802A Unspecified open wound, left lower leg, initial encounter: Secondary | ICD-10-CM | POA: Insufficient documentation

## 2018-06-15 DIAGNOSIS — Z09 Encounter for follow-up examination after completed treatment for conditions other than malignant neoplasm: Secondary | ICD-10-CM | POA: Diagnosis not present

## 2018-06-15 DIAGNOSIS — G8929 Other chronic pain: Secondary | ICD-10-CM | POA: Insufficient documentation

## 2018-06-15 DIAGNOSIS — G894 Chronic pain syndrome: Secondary | ICD-10-CM | POA: Diagnosis not present

## 2018-06-15 DIAGNOSIS — B20 Human immunodeficiency virus [HIV] disease: Secondary | ICD-10-CM | POA: Diagnosis not present

## 2018-06-15 DIAGNOSIS — E1142 Type 2 diabetes mellitus with diabetic polyneuropathy: Secondary | ICD-10-CM | POA: Diagnosis not present

## 2018-06-15 DIAGNOSIS — L97222 Non-pressure chronic ulcer of left calf with fat layer exposed: Secondary | ICD-10-CM | POA: Diagnosis not present

## 2018-06-15 DIAGNOSIS — Z794 Long term (current) use of insulin: Secondary | ICD-10-CM | POA: Diagnosis not present

## 2018-06-15 DIAGNOSIS — R52 Pain, unspecified: Secondary | ICD-10-CM | POA: Diagnosis not present

## 2018-06-15 DIAGNOSIS — E1151 Type 2 diabetes mellitus with diabetic peripheral angiopathy without gangrene: Secondary | ICD-10-CM | POA: Diagnosis not present

## 2018-06-15 DIAGNOSIS — I872 Venous insufficiency (chronic) (peripheral): Secondary | ICD-10-CM | POA: Diagnosis not present

## 2018-06-15 LAB — POCT URINALYSIS DIP (MANUAL ENTRY)
Bilirubin, UA: NEGATIVE
Glucose, UA: NEGATIVE mg/dL
Ketones, POC UA: NEGATIVE mg/dL
Leukocytes, UA: NEGATIVE
Nitrite, UA: NEGATIVE
Protein Ur, POC: >=300 mg/dL — AB
Spec Grav, UA: >=1.03 — AB (ref 1.010–1.025)
Urobilinogen, UA: 0.2 E.U./dL
pH, UA: 5.5 (ref 5.0–8.0)

## 2018-06-15 MED ORDER — METFORMIN HCL 500 MG PO TABS: 500.0000 mg | ORAL_TABLET | Freq: Two times a day (BID) | ORAL | 3 refills | Status: DC

## 2018-06-15 NOTE — Patient Instructions (Signed)
Metformin extended-release tablets  What is this medicine?  METFORMIN (met FOR min) is used to treat type 2 diabetes. It helps to control blood sugar. Treatment is combined with diet and exercise. This medicine can be used alone or with other medicines for diabetes.  This medicine may be used for other purposes; ask your health care provider or pharmacist if you have questions.  COMMON BRAND NAME(S): Fortamet, Glucophage XR, Glumetza  What should I tell my health care provider before I take this medicine?  They need to know if you have any of these conditions:  -anemia  -dehydration  -heart disease  -frequently drink alcohol-containing beverages  -kidney disease  -liver disease  -polycystic ovary syndrome  -serious infection or injury  -vomiting  -an unusual or allergic reaction to metformin, other medicines, foods, dyes, or preservatives  -pregnant or trying to get pregnant  -breast-feeding  How should I use this medicine?  Take this medicine by mouth with a glass of water. Follow the directions on the prescription label. Take this medicine with food. Take your medicine at regular intervals. Do not take your medicine more often than directed. Do not stop taking except on your doctor's advice.  Talk to your pediatrician regarding the use of this medicine in children. Special care may be needed.  Overdosage: If you think you have taken too much of this medicine contact a poison control center or emergency room at once.  NOTE: This medicine is only for you. Do not share this medicine with others.  What if I miss a dose?  If you miss a dose, take it as soon as you can. If it is almost time for your next dose, take only that dose. Do not take double or extra doses.  What may interact with this medicine?  Do not take this medicine with any of the following medications:  -certain contrast medicines given before X-rays, CT scans, MRI, or other procedures  -dofetilide  This medicine may also interact with the following  medications:  -acetazolamide  -alcohol  -certain antivirals for HIV or hepatitis  -certain medicines for blood pressure, heart disease, irregular heart beat  -cimetidine  -dichlorphenamide  -digoxin  -diuretics  -male hormones, like estrogens or progestins and birth control pills  -glycopyrrolate  -isoniazid  -lamotrigine  -memantine  -methazolamide  -metoclopramide  -midodrine  -niacin  -phenothiazines like chlorpromazine, mesoridazine, prochlorperazine, thioridazine  -phenytoin  -ranolazine  -steroid medicines like prednisone or cortisone  -stimulant medicines for attention disorders, weight loss, or to stay awake  -thyroid medicines  -topiramate  -trospium  -vandetanib  -zonisamide  This list may not describe all possible interactions. Give your health care provider a list of all the medicines, herbs, non-prescription drugs, or dietary supplements you use. Also tell them if you smoke, drink alcohol, or use illegal drugs. Some items may interact with your medicine.  What should I watch for while using this medicine?  Visit your doctor or health care professional for regular checks on your progress.  A test called the HbA1C (A1C) will be monitored. This is a simple blood test. It measures your blood sugar control over the last 2 to 3 months. You will receive this test every 3 to 6 months.  Learn how to check your blood sugar. Learn the symptoms of low and high blood sugar and how to manage them.  Always carry a quick-source of sugar with you in case you have symptoms of low blood sugar. Examples include hard sugar candy or   or exercising more than usual, you might need to  change the dose of your medicine. Do not skip meals. Ask your doctor or health care professional if you should avoid alcohol. Many nonprescription cough and cold products contain sugar or alcohol. These can affect blood sugar. This medicine may cause ovulation in premenopausal women who do not have regular monthly periods. This may increase your chances of becoming pregnant. You should not take this medicine if you become pregnant or think you may be pregnant. Talk with your doctor or health care professional about your birth control options while taking this medicine. Contact your doctor or health care professional right away if you think you are pregnant. The tablet shell for some brands of this medicine does not dissolve. This is normal. The tablet shell may appear whole in the stool. This is not a cause for concern. If you are going to need surgery, a MRI, CT scan, or other procedure, tell your doctor that you are taking this medicine. You may need to stop taking this medicine before the procedure. Wear a medical ID bracelet or chain, and carry a card that describes your disease and details of your medicine and dosage times. This medicine may cause a decrease in folic acid and vitamin B12. You should make sure that you get enough vitamins while you are taking this medicine. Discuss the foods you eat and the vitamins you take with your health care professional. What side effects may I notice from receiving this medicine? Side effects that you should report to your doctor or health care professional as soon as possible: -allergic reactions like skin rash, itching or hives, swelling of the face, lips, or tongue -breathing problems -feeling faint or lightheaded, falls -muscle aches or pains -signs and symptoms of low blood sugar such as feeling anxious, confusion, dizziness, increased hunger, unusually weak or tired, sweating, shakiness, cold, irritable, headache, blurred vision, fast heartbeat, loss of  consciousness -slow or irregular heartbeat -unusual stomach pain or discomfort -unusually tired or weak Side effects that usually do not require medical attention (report to your doctor or health care professional if they continue or are bothersome): -diarrhea -headache -heartburn -metallic taste in mouth -nausea -stomach gas, upset This list may not describe all possible side effects. Call your doctor for medical advice about side effects. You may report side effects to FDA at 1-800-FDA-1088. Where should I keep my medicine? Keep out of the reach of children. Store at room temperature between 15 and 30 degrees C (59 and 86 degrees F). Protect from light. Throw away any unused medicine after the expiration date. NOTE: This sheet is a summary. It may not cover all possible information. If you have questions about this medicine, talk to your doctor, pharmacist, or health care provider.  2019 Elsevier/Gold Standard (2017-05-01 18:58:32)

## 2018-06-15 NOTE — Telephone Encounter (Signed)
Phone not working. Have not received a call back from message left with patient's brother last week.   Cletis Media RN BSN CWS [place]

## 2018-06-15 NOTE — Progress Notes (Signed)
Patient [facility] Follow Up  Subjective:  [name], male    DOB: 05-Oct-1965  Age: 53 y.o. MRN: [id number]  CC:  Chief Complaint  Patient presents with  . Follow-up    Chronic condition     HPI Keith [name] is a 53 year old male who presents for Hospital Follow Up today.   Past Medical History:  Diagnosis Date  . Abscess of right foot   . AIDS ([place])   . Chronic knee pain    right  . Chronic pain   . Diabetes type 2, uncontrolled ([place])    HgA1c 17.6 (04/27/2010)  . Diabetic foot ulcer ([place]) 01/2017   right foot  . Erectile dysfunction   . Genital warts   . HIV (human immunodeficiency virus infection) ([place]) 2009   CD4 count 100, VL 13800 (05/01/2010)  . Hypertension   . Osteomyelitis ([place])    h/o hand  . Osteomyelitis of metatarsal ([place]) 04/28/2017   Current Status: Since his last office visit he has had a hospital admission on 06/05/2018 for Infected Wound. He was hospitalized for 3 days for infection of left leg. He continues to wear a prosthetic for his right BKA on a daily basis. Today he is doing well with no complaints. He has home wound care twice a week. He denies fatigue, frequent urination, blurred vision, excessive hunger, excessive thirst, weight gain, weight loss, and poor wound healing. He would like to discontinue insulin use. He continues to follow up with Pain Management as needed.   He denies fevers, chills, fatigue, recent infections, weight loss, and night sweats. He has not had any headaches, visual changes, dizziness, and falls. No chest pain, heart palpitations, cough and shortness of breath reported. No reports of GI problems such as nausea, vomiting, diarrhea, and constipation. He has no reports of blood in stools, dysuria and hematuria. No depression or anxiety reported. He denies pain today.   Past Surgical History:  Procedure Laterality Date  . AMPUTATION Right 05/02/2017   Procedure: AMPUTATION TRANSMETARSAL;  Surgeon: [name], MD;  Location: [place];  Service: Orthopedics;  Laterality: Right;  . AMPUTATION Right 06/13/2017   Procedure: RIGHT BELOW KNEE AMPUTATION;  Surgeon: [name], MD;  Location: [place];  Service: Orthopedics;  Laterality: Right;  . BELOW KNEE LEG AMPUTATION Right 06/13/2017  . HERNIA REPAIR    . I&D EXTREMITY Left 08/21/2014   Procedure: INCISION AND DRAINAGE LEFT SMALL FINGER;  Surgeon: Leanora Cover, MD;  Location: [place];  Service: Orthopedics;  Laterality: Left;  . I&D EXTREMITY Right 03/18/2017   Procedure: IRRIGATION AND DEBRIDEMENT EXTREMITY;  Surgeon: [name], MD;  Location: [place];  Service: Orthopedics;  Laterality: Right;  . MINOR IRRIGATION AND DEBRIDEMENT OF WOUND Right 04/22/2014   Procedure: IRRIGATION AND DEBRIDEMENT OF RIGHT NECK ABCESS;  Surgeon: Jerrell Belfast, MD;  Location: [place];  Service: ENT;  Laterality: Right;  . MULTIPLE EXTRACTIONS WITH ALVEOLOPLASTY N/A 01/18/2013   Procedure: MULTIPLE EXTRACION 3, 6, 7, 10, 11, 13, 21, 22, 27, 28, 29, 30 WITH ALVEOLOPLASTY;  Surgeon: Gae Bon, DDS;  Location: [place];  Service: Oral Surgery;  Laterality: N/A;  . SKIN SPLIT GRAFT Right 03/21/2017   Procedure: IRRIGATION AND DEBRIDEMENT RIGHT FOOT AND APPLY SPLIT THICKNESS SKIN GRAFT AND WOUND VAC;  Surgeon: [name], MD;  Location: [place];  Service: Orthopedics;  Laterality: Right;  . TEE WITHOUT  CARDIOVERSION N/A 05/02/2017   Procedure: TRANSESOPHAGEAL ECHOCARDIOGRAM (TEE);  Surgeon: Acie Fredrickson Wonda Cheng, MD;  [facility];  Service: Cardiovascular;  Laterality: N/A;  coincidental to orthopedic case    Family History  Problem Relation Age of Onset  . Hypertension Mother   . Arthritis Father   . Hypertension Father   . Hypertension Brother   . Cancer Maternal Grandmother 35       unknown type of cancer  . Depression Paternal Grandmother     Social History   Socioeconomic History  . Marital status:  Single    Spouse name: Not on file  . Number of children: 3  . Years of education: 32  . Highest education level: Not on file  Occupational History  . Occupation: disabled  Social Needs  . Financial resource strain: Not on file  . Food insecurity:    Worry: Not on file    Inability: Not on file  . Transportation needs:    Medical: Not on file    Non-medical: Not on file  Tobacco Use  . Smoking status: Never Smoker  . Smokeless tobacco: Never Used  Substance and Sexual Activity  . Alcohol use: No    Alcohol/week: 0.0 standard drinks  . Drug use: Not Currently    Comment: OTC ASA  . Sexual activity: Yes    Partners: Female    Comment: given condoms  Lifestyle  . Physical activity:    Days per week: Not on file    Minutes per session: Not on file  . Stress: Not on file  Relationships  . Social connections:    Talks on phone: Not on file    Gets together: Not on file    Attends religious service: Not on file    Active member of club or organization: Not on file    Attends meetings of clubs or organizations: Not on file    Relationship status: Not on file  . Intimate partner violence:    Fear of current or ex partner: Not on file    Emotionally abused: Not on file    Physically abused: Not on file    Forced sexual activity: Not on file  Other Topics Concern  . Not on file  Social History Narrative   Worked for the city of [place] for 18 years.   Unemployed.    Applying for disability.   Medicaid patient.    Outpatient Medications Prior to Visit  Medication Sig Dispense Refill  . amoxicillin-clavulanate (AUGMENTIN) 875-125 MG tablet Take 1 tablet by mouth every 12 (twelve) hours. 10 tablet 0  . glucose blood (COOL BLOOD GLUCOSE TEST STRIPS) test strip Use as instructed 100 each 12  . ibuprofen (ADVIL,MOTRIN) 600 MG tablet Take 1 tablet (600 mg total) by mouth every 6 (six) hours as needed. 20 tablet 0  . Insulin Glargine (LANTUS SOLOSTAR) 100 UNIT/ML Solostar  Pen Inject 20 Units into the skin daily at 10 pm. (Patient taking differently: Inject 20 Units into the skin at bedtime. ) 15 mL 3  . Insulin Pen Needle (B-D UF III MINI PEN NEEDLES) 31G X 5 MM MISC 20 Units by Does not apply route as directed. 100 each 2  . Insulin Syringes, Disposable, U-100 0.3 ML MISC 1 Syringe by Does not apply route 3 (three) times daily. 100 each 11  . Lancets (ONETOUCH ULTRASOFT) lancets Use as instructed 100 each 12  . PREZCOBIX 800-150 MG tablet TAKE 1 TABLET BY MOUTH DAILY. SWALLOW WHOLE. DO  NOT CRUSH, BREAK OR CHEW TABLETS. TAKE WITH FOOD. (Patient taking differently: Take 1 tablet by mouth daily. ) 30 tablet 0  . tadalafil (ADCIRCA/CIALIS) 20 MG tablet Take 0.5-1 tablets (10-20 mg total) by mouth every other day as needed for erectile dysfunction. 5 tablet 2  . TRIUMEQ 600-50-300 MG tablet TAKE 1 TABLET BY MOUTH DAILY. (Patient taking differently: Take 1 tablet by mouth daily. May take with or without food.) 30 tablet 0  . amLODipine (NORVASC) 10 MG tablet Take 1 tablet (10 mg total) by mouth daily. (Patient not taking: Reported on 06/06/2018) 30 tablet 0   No facility-administered medications prior to visit.     Allergies  Allergen Reactions  . Sulfur Itching    Patient stated he's allergic to "sulfur" AND "sulfa"  . Sulfa Antibiotics Itching    ROS Review of Systems  Constitutional: Negative.   HENT: Negative.   Eyes: Negative.   Respiratory: Negative.   Cardiovascular: Negative.   Gastrointestinal: Negative.   Endocrine: Negative.   Genitourinary: Negative.   Musculoskeletal: Positive for arthralgias (Generalized chronic pain).  Skin: Negative.   Allergic/Immunologic: Negative.   Neurological: Positive for dizziness and headaches.  Hematological: Negative.   Psychiatric/Behavioral: Negative.    Objective:    Physical Exam  Constitutional: He is oriented to person, place, and time. He appears well-developed and well-nourished.  HENT:  Head:  Normocephalic and atraumatic.  Eyes: Conjunctivae are normal.  Neck: Normal range of motion. Neck supple.  Cardiovascular: Normal rate, regular rhythm, normal heart sounds and intact distal pulses.  Pulmonary/Chest: Effort normal and breath sounds normal.  Abdominal: Soft. Bowel sounds are normal. He exhibits distension.  Musculoskeletal:     Comments: Right BKA with prosthetic.  Neurological: He is alert and oriented to person, place, and time.  Skin: Skin is warm and dry.  Wound located on left leg/knee area.   Psychiatric: He has a normal mood and affect. His behavior is normal. Judgment and thought content normal.  Vitals reviewed.  BP (!) 144/98 (BP Location: Right Arm, Patient Position: Sitting, Cuff Size: Large)   Pulse 79   Temp 98 F (36.7 C) (Oral)   Ht 5\' 9"  (1.753 m)   Wt 274 lb (124.3 kg)   SpO2 100%   BMI 40.46 kg/m  Wt Readings from Last 3 Encounters:  06/15/18 274 lb (124.3 kg)  06/06/18 265 lb 3.4 oz (120.3 kg)  05/22/18 280 lb (127 kg)     Health Maintenance Due  Topic Date Due  . OPHTHALMOLOGY EXAM  06/23/2012  . COLONOSCOPY  11/18/2014  . FOOT EXAM  02/23/2015  . URINE MICROALBUMIN  02/23/2015  . LIPID PANEL  10/25/2017  . INFLUENZA VACCINE  11/06/2017    There are no preventive care reminders to display for this patient.  Lab Results  Component Value Date   TSH 4.83 (H) 10/25/2016   Lab Results  Component Value Date   WBC 4.8 06/07/2018   HGB 11.2 (L) 06/07/2018   HCT 35.8 (L) 06/07/2018   MCV 83.1 06/07/2018   PLT 216 06/07/2018   Lab Results  Component Value Date   NA 136 06/07/2018   K 4.0 06/07/2018   CO2 24 06/07/2018   GLUCOSE 127 (H) 06/07/2018   BUN 21 (H) 06/07/2018   CREATININE 1.89 (H) 06/07/2018   BILITOT 0.2 (L) 06/05/2018   ALKPHOS 72 06/05/2018   AST 28 06/05/2018   ALT 25 06/05/2018   PROT 8.4 (H) 06/05/2018   ALBUMIN 3.6 06/05/2018  CALCIUM 8.9 06/07/2018   ANIONGAP 6 06/07/2018   Lab Results  Component  Value Date   CHOL 295 (H) 10/25/2016   Lab Results  Component Value Date   HDL 39 (L) 10/25/2016   Lab Results  Component Value Date   LDLCALC 228 (H) 10/25/2016   Lab Results  Component Value Date   TRIG 138 10/25/2016   Lab Results  Component Value Date   CHOLHDL 7.6 (H) 10/25/2016   Lab Results  Component Value Date   HGBA1C 7.8 (H) 06/06/2018   Assessment & Plan:   1. Type 2 diabetes mellitus without complication, without long-term current use of insulin (HCC) Previous Hgb A1c at stable at 7.8 on 2/29/202, increased from 6.4 on 01/06/2018. We will discontinue Insulin today at patient's request. We will re-initiate Metformin 500 mg BID today. He will continue to decrease foods/beverages high in sugars and carbs and follow Heart Healthy or DASH diet. Increase physical activity to at least 30 minutes cardio exercise daily.  - POCT urinalysis dipstick - metFORMIN (GLUCOPHAGE) 500 MG tablet; Take 1 tablet (500 mg total) by mouth 2 (two) times daily with a meal.  Dispense: 60 tablet; Refill: 3  2. Chronic pain syndrome - Ambulatory referral to Pain Clinic  3. Chronic low back pain without sciatica, unspecified back pain laterality - Ambulatory referral to Pain Clinic  4. Wound of left lower extremity, subsequent encounter Stable. He continues to receive home wound care twice weekly.   5. Pain management We will refer him to another Pain Clinic today, in [place].  - Ambulatory referral to Pain Clinic  6. HIV He will continue antiviral medications as prescribed. Continue to follow up with Infection Disease as needed.   7. Hypertension Blood pressure is stable today. Blood pressure is at 144/98 today. Continue Amlodipine as prescribed. He will continue to decrease high sodium intake, excessive alcohol intake, increase potassium intake, smoking cessation, and increase physical activity of at least 30 minutes of cardio activity daily. He will continue to follow Heart Healthy  or DASH diet.  8. Follow up He will follow up in 1 month.    Problem List Items Addressed This Visit      Other   Chronic pain (Chronic)   Relevant Orders   Ambulatory referral to Pain Clinic    Other Visit Diagnoses    Type 2 diabetes mellitus without complication, without long-term current use of insulin (HCC)    -  Primary   Relevant Medications   metFORMIN (GLUCOPHAGE) 500 MG tablet   Other Relevant Orders   POCT urinalysis dipstick (Completed)   Chronic low back pain without sciatica, unspecified back pain laterality       Relevant Orders   Ambulatory referral to Pain Clinic   Wound of left lower extremity, subsequent encounter       Pain management       Follow up          Meds ordered this encounter  Medications  . metFORMIN (GLUCOPHAGE) 500 MG tablet    Sig: Take 1 tablet (500 mg total) by mouth 2 (two) times daily with a meal.    Dispense:  60 tablet    Refill:  3    Follow-up: Return in about 1 month (around 07/16/2018).    [name], FNP

## 2018-06-18 DIAGNOSIS — I872 Venous insufficiency (chronic) (peripheral): Secondary | ICD-10-CM | POA: Diagnosis not present

## 2018-06-18 DIAGNOSIS — E1151 Type 2 diabetes mellitus with diabetic peripheral angiopathy without gangrene: Secondary | ICD-10-CM | POA: Diagnosis not present

## 2018-06-18 DIAGNOSIS — E1142 Type 2 diabetes mellitus with diabetic polyneuropathy: Secondary | ICD-10-CM | POA: Diagnosis not present

## 2018-06-18 DIAGNOSIS — Z794 Long term (current) use of insulin: Secondary | ICD-10-CM | POA: Diagnosis not present

## 2018-06-18 DIAGNOSIS — Z4800 Encounter for change or removal of nonsurgical wound dressing: Secondary | ICD-10-CM | POA: Diagnosis not present

## 2018-06-18 DIAGNOSIS — L97222 Non-pressure chronic ulcer of left calf with fat layer exposed: Secondary | ICD-10-CM | POA: Diagnosis not present

## 2018-06-22 ENCOUNTER — Telehealth: Payer: Self-pay

## 2018-06-22 DIAGNOSIS — I872 Venous insufficiency (chronic) (peripheral): Secondary | ICD-10-CM | POA: Diagnosis not present

## 2018-06-22 DIAGNOSIS — Z4800 Encounter for change or removal of nonsurgical wound dressing: Secondary | ICD-10-CM | POA: Diagnosis not present

## 2018-06-22 DIAGNOSIS — L97222 Non-pressure chronic ulcer of left calf with fat layer exposed: Secondary | ICD-10-CM | POA: Diagnosis not present

## 2018-06-22 DIAGNOSIS — E1142 Type 2 diabetes mellitus with diabetic polyneuropathy: Secondary | ICD-10-CM | POA: Diagnosis not present

## 2018-06-22 DIAGNOSIS — Z794 Long term (current) use of insulin: Secondary | ICD-10-CM | POA: Diagnosis not present

## 2018-06-22 DIAGNOSIS — E1151 Type 2 diabetes mellitus with diabetic peripheral angiopathy without gangrene: Secondary | ICD-10-CM | POA: Diagnosis not present

## 2018-06-22 NOTE — Telephone Encounter (Signed)
Olivia Mackie, RN/[facility] called stating BP elevated 160/110 today and 154/90 Thursday. Amlodipine was discontinued. Any recommendations?

## 2018-06-23 DIAGNOSIS — E559 Vitamin D deficiency, unspecified: Secondary | ICD-10-CM | POA: Diagnosis not present

## 2018-06-23 DIAGNOSIS — M545 Low back pain: Secondary | ICD-10-CM | POA: Diagnosis not present

## 2018-06-23 DIAGNOSIS — G546 Phantom limb syndrome with pain: Secondary | ICD-10-CM | POA: Diagnosis not present

## 2018-06-23 DIAGNOSIS — M129 Arthropathy, unspecified: Secondary | ICD-10-CM | POA: Diagnosis not present

## 2018-06-23 DIAGNOSIS — Z79899 Other long term (current) drug therapy: Secondary | ICD-10-CM | POA: Diagnosis not present

## 2018-06-26 DIAGNOSIS — E1151 Type 2 diabetes mellitus with diabetic peripheral angiopathy without gangrene: Secondary | ICD-10-CM | POA: Diagnosis not present

## 2018-06-26 DIAGNOSIS — E669 Obesity, unspecified: Secondary | ICD-10-CM | POA: Diagnosis not present

## 2018-06-26 DIAGNOSIS — N183 Chronic kidney disease, stage 3 (moderate): Secondary | ICD-10-CM | POA: Diagnosis not present

## 2018-06-26 DIAGNOSIS — B9562 Methicillin resistant Staphylococcus aureus infection as the cause of diseases classified elsewhere: Secondary | ICD-10-CM | POA: Diagnosis not present

## 2018-06-26 DIAGNOSIS — E1142 Type 2 diabetes mellitus with diabetic polyneuropathy: Secondary | ICD-10-CM | POA: Diagnosis not present

## 2018-06-26 DIAGNOSIS — I872 Venous insufficiency (chronic) (peripheral): Secondary | ICD-10-CM | POA: Diagnosis not present

## 2018-06-26 DIAGNOSIS — I10 Essential (primary) hypertension: Secondary | ICD-10-CM | POA: Diagnosis not present

## 2018-06-26 DIAGNOSIS — Z4800 Encounter for change or removal of nonsurgical wound dressing: Secondary | ICD-10-CM | POA: Diagnosis not present

## 2018-06-26 DIAGNOSIS — Z794 Long term (current) use of insulin: Secondary | ICD-10-CM | POA: Diagnosis not present

## 2018-06-26 DIAGNOSIS — Z89511 Acquired absence of right leg below knee: Secondary | ICD-10-CM | POA: Diagnosis not present

## 2018-06-26 DIAGNOSIS — E1122 Type 2 diabetes mellitus with diabetic chronic kidney disease: Secondary | ICD-10-CM | POA: Diagnosis not present

## 2018-06-26 DIAGNOSIS — L97222 Non-pressure chronic ulcer of left calf with fat layer exposed: Secondary | ICD-10-CM | POA: Diagnosis not present

## 2018-06-26 DIAGNOSIS — Z6841 Body Mass Index (BMI) 40.0 and over, adult: Secondary | ICD-10-CM | POA: Diagnosis not present

## 2018-06-26 DIAGNOSIS — Z21 Asymptomatic human immunodeficiency virus [HIV] infection status: Secondary | ICD-10-CM | POA: Diagnosis not present

## 2018-06-30 DIAGNOSIS — B9562 Methicillin resistant Staphylococcus aureus infection as the cause of diseases classified elsewhere: Secondary | ICD-10-CM | POA: Diagnosis not present

## 2018-06-30 DIAGNOSIS — E1142 Type 2 diabetes mellitus with diabetic polyneuropathy: Secondary | ICD-10-CM | POA: Diagnosis not present

## 2018-06-30 DIAGNOSIS — I872 Venous insufficiency (chronic) (peripheral): Secondary | ICD-10-CM | POA: Diagnosis not present

## 2018-06-30 DIAGNOSIS — L97222 Non-pressure chronic ulcer of left calf with fat layer exposed: Secondary | ICD-10-CM | POA: Diagnosis not present

## 2018-06-30 DIAGNOSIS — Z4800 Encounter for change or removal of nonsurgical wound dressing: Secondary | ICD-10-CM | POA: Diagnosis not present

## 2018-06-30 DIAGNOSIS — E1151 Type 2 diabetes mellitus with diabetic peripheral angiopathy without gangrene: Secondary | ICD-10-CM | POA: Diagnosis not present

## 2018-07-02 DIAGNOSIS — I872 Venous insufficiency (chronic) (peripheral): Secondary | ICD-10-CM | POA: Diagnosis not present

## 2018-07-02 DIAGNOSIS — E1151 Type 2 diabetes mellitus with diabetic peripheral angiopathy without gangrene: Secondary | ICD-10-CM | POA: Diagnosis not present

## 2018-07-02 DIAGNOSIS — L97222 Non-pressure chronic ulcer of left calf with fat layer exposed: Secondary | ICD-10-CM | POA: Diagnosis not present

## 2018-07-02 DIAGNOSIS — Z4800 Encounter for change or removal of nonsurgical wound dressing: Secondary | ICD-10-CM | POA: Diagnosis not present

## 2018-07-02 DIAGNOSIS — B9562 Methicillin resistant Staphylococcus aureus infection as the cause of diseases classified elsewhere: Secondary | ICD-10-CM | POA: Diagnosis not present

## 2018-07-02 DIAGNOSIS — E1142 Type 2 diabetes mellitus with diabetic polyneuropathy: Secondary | ICD-10-CM | POA: Diagnosis not present

## 2018-07-02 LAB — HIV GENOSURE(R) MG

## 2018-07-02 LAB — HIV-1 RNA, PCR (GRAPH) RFX/GENO EDI
HIV-1 RNA BY PCR: 26300 copies/mL
HIV-1 RNA Quant, Log: 4.42 log10copy/mL

## 2018-07-02 LAB — REFLEX TO GENOSURE(R) MG EDI: HIV GenoSure(R): 1

## 2018-07-06 ENCOUNTER — Encounter (INDEPENDENT_AMBULATORY_CARE_PROVIDER_SITE_OTHER): Payer: Self-pay | Admitting: Orthopedic Surgery

## 2018-07-06 ENCOUNTER — Ambulatory Visit (INDEPENDENT_AMBULATORY_CARE_PROVIDER_SITE_OTHER): Payer: Medicare Other | Admitting: Orthopedic Surgery

## 2018-07-06 ENCOUNTER — Other Ambulatory Visit: Payer: Self-pay

## 2018-07-06 VITALS — Ht 69.0 in | Wt 274.0 lb

## 2018-07-06 DIAGNOSIS — Z89511 Acquired absence of right leg below knee: Secondary | ICD-10-CM

## 2018-07-06 DIAGNOSIS — E1151 Type 2 diabetes mellitus with diabetic peripheral angiopathy without gangrene: Secondary | ICD-10-CM | POA: Diagnosis not present

## 2018-07-06 DIAGNOSIS — I872 Venous insufficiency (chronic) (peripheral): Secondary | ICD-10-CM

## 2018-07-06 DIAGNOSIS — L97222 Non-pressure chronic ulcer of left calf with fat layer exposed: Secondary | ICD-10-CM | POA: Diagnosis not present

## 2018-07-06 DIAGNOSIS — E1142 Type 2 diabetes mellitus with diabetic polyneuropathy: Secondary | ICD-10-CM | POA: Diagnosis not present

## 2018-07-06 DIAGNOSIS — B9562 Methicillin resistant Staphylococcus aureus infection as the cause of diseases classified elsewhere: Secondary | ICD-10-CM | POA: Diagnosis not present

## 2018-07-06 DIAGNOSIS — Z4800 Encounter for change or removal of nonsurgical wound dressing: Secondary | ICD-10-CM | POA: Diagnosis not present

## 2018-07-06 DIAGNOSIS — L97221 Non-pressure chronic ulcer of left calf limited to breakdown of skin: Secondary | ICD-10-CM | POA: Diagnosis not present

## 2018-07-06 DIAGNOSIS — L97211 Non-pressure chronic ulcer of right calf limited to breakdown of skin: Secondary | ICD-10-CM | POA: Insufficient documentation

## 2018-07-06 NOTE — Telephone Encounter (Signed)
This is not a Dr. [name] patient. Keith [name] has contacted patients PCP.

## 2018-07-06 NOTE — Progress Notes (Signed)
Office Visit Note   Patient: Keith [name]           Date of Birth: [birth year]           MRN: [id number] Visit Date: 07/06/2018              Requested by: [name], [place], [place] [address] PCP: [name], FNP  Chief Complaint  Patient presents with  . Right Leg - Follow-up      HPI: Patient is a 53 year old gentleman who presents for evaluation for a new and bearing ulcer right transtibial amputation as well as venous stasis ulceration to the left leg.  Patient has been using an Ace wrap and dry dressing to the left calf ulcer.  Assessment & Plan: Visit Diagnoses:  1. Venous stasis ulcer of left calf limited to breakdown of skin without varicose veins (HCC)   2. Diabetic polyneuropathy associated with type 2 diabetes mellitus ([place])   3. Acquired absence of right leg below knee (HCC)   4. Non-pressure chronic ulcer of right calf limited to breakdown of skin ([place])     Plan: Recommended patient stay out of work for the next 3 weeks minimize the use of his prosthetic leg on the right.  Wash this with soap and water daily Band-Aid.  On the left leg he is given a sample of a medical compression sock he will wear this around the clock to change it daily and wash his leg with soap and water he was given a prescription for this.  Follow-Up Instructions: Return in about 3 weeks (around 07/27/2018).   Ortho Exam  Patient is alert, oriented, no adenopathy, well-dressed, normal affect, normal respiratory effort. Examination patient has a new end bearing ulcer on his right below the knee amputation.  After informed consent a 10 blade knife was used to debride the skin and soft tissue back to bleeding viable granulation tissue.  The ulcer is 2 cm in diameter 5 mm deep there is good bleeding granulation tissue this was touched with silver nitrate.  There is no abscess no exposed bone no exposed tendon no signs of infection.  A Band-Aid was applied.   Examination the left leg patient has significant venous stasis swelling.  The venous ulcer has improved and has superficial epithelialization.  Patient was given a medical compression stocking to help with the healing process.  There is no signs of infection no drainage from the left calf.  Imaging: No results found. No images are attached to the encounter.  Labs: Lab Results  Component Value Date   HGBA1C 7.8 (H) 06/06/2018   HGBA1C 6.4 (A) 01/06/2018   HGBA1C 6.4 (A) 11/04/2017   ESRSEDRATE 74 (H) 06/06/2018   ESRSEDRATE 140 (H) 04/28/2017   ESRSEDRATE 127 (H) 03/15/2017   CRP <0.8 06/06/2018   CRP 14.9 (H) 04/28/2017   CRP 23.1 (H) 03/15/2017   LABURIC 6.3 08/07/2009   REPTSTATUS 06/11/2018 FINAL 06/06/2018   GRAMSTAIN  04/29/2017    NO WBC SEEN RARE GRAM POSITIVE COCCI IN PAIRS IN SINGLES    CULT  06/06/2018    NO GROWTH 5 DAYS Performed at [facility], [place] [address] [place], [place] [address]    LABORGA METHICILLIN RESISTANT STAPHYLOCOCCUS AUREUS 04/29/2017     Lab Results  Component Value Date   ALBUMIN 3.6 06/05/2018   ALBUMIN 3.6 08/19/2017   ALBUMIN 3.0 (L) 04/28/2017   PREALBUMIN 25.5 06/06/2018   PREALBUMIN 7.4 (  L) 03/15/2017   LABURIC 6.3 08/07/2009    Body mass index is 40.46 kg/m.  Orders:  No orders of the defined types were placed in this encounter.  No orders of the defined types were placed in this encounter.    Procedures: No procedures performed  Clinical Data: No additional findings.  ROS:  All other systems negative, except as noted in the HPI. Review of Systems  Objective: Vital Signs: Ht 5\' 9"  (1.753 m)   Wt 274 lb (124.3 kg)   BMI 40.46 kg/m   Specialty Comments:  No specialty comments available.  PMFS History: Patient Active Problem List   Diagnosis Date Noted  . Non-pressure chronic ulcer of right calf limited to breakdown of skin ([place]) 07/06/2018  . Type 2 diabetes mellitus without complication, without  long-term current use of insulin ([place]) 06/15/2018  . Chronic low back pain without sciatica 06/15/2018  . Wound of left leg 06/15/2018  . Idiopathic chronic venous hypertension of left lower extremity with ulcer and inflammation ([place])   . Venous stasis ulcer of left calf limited to breakdown of skin without varicose veins ([place]) 04/23/2018  . Acquired absence of right leg below knee ([place]) 06/13/2017  . S/P transmetatarsal amputation of foot, right ([place]) 05/07/2017  . Wound infection   . Bacteremia   . Fever   . Sepsis ([place]) 03/15/2017  . CKD (chronic kidney disease) stage 3, GFR 30-59 ml/min (HCC) 03/15/2017  . Diabetic polyneuropathy associated with type 2 diabetes mellitus ([place]) 01/23/2017  . AKI (acute kidney injury) ([place]) 09/28/2016  . Nausea vomiting and diarrhea 09/28/2016  . Onychomycosis of multiple toenails with type 2 diabetes mellitus ([place]) 08/29/2015  . MRSA carrier 04/20/2014  . Testosterone deficiency 04/20/2014  . Penile wart 02/22/2014  . HIV disease ([place])   . Insulin-requiring or dependent type II diabetes mellitus ([place]) 02/04/2014  . Dental anomaly 11/20/2012  . Arthritis of right knee 02/23/2012  . Hyperlipidemia 11/10/2011  . Chronic pain 08/07/2011  . Meralgia paraesthetica 04/23/2011  . ERECTILE DYSFUNCTION 08/22/2008  . Essential hypertension 05/19/2008   Past Medical History:  Diagnosis Date  . Abscess of right foot   . AIDS ([place])   . Chronic knee pain    right  . Chronic pain   . Diabetes type 2, uncontrolled ([place])    HgA1c 17.6 (04/27/2010)  . Diabetic foot ulcer ([place]) 01/2017   right foot  . Erectile dysfunction   . Genital warts   . HIV (human immunodeficiency virus infection) ([place]) 2009   CD4 count 100, VL 13800 (05/01/2010)  . Hypertension   . Osteomyelitis ([place])    h/o hand  . Osteomyelitis of metatarsal ([place]) 04/28/2017    Family History  Problem Relation Age of Onset  . Hypertension Mother   . Arthritis Father   . Hypertension Father    . Hypertension Brother   . Cancer Maternal Grandmother 71       unknown type of cancer  . Depression Paternal Grandmother     Past Surgical History:  Procedure Laterality Date  . AMPUTATION Right 05/02/2017   Procedure: AMPUTATION TRANSMETARSAL;  Surgeon: [name], MD;  Location: [facility];  Service: Orthopedics;  Laterality: Right;  . AMPUTATION Right 06/13/2017   Procedure: RIGHT BELOW KNEE AMPUTATION;  Surgeon: [name], MD;  Location: [place];  Service: Orthopedics;  Laterality: Right;  . BELOW KNEE LEG AMPUTATION Right 06/13/2017  . HERNIA REPAIR    . I&D EXTREMITY Left 08/21/2014  Procedure: INCISION AND DRAINAGE LEFT SMALL FINGER;  Surgeon: Leanora Cover, MD;  Location: [place];  Service: Orthopedics;  Laterality: Left;  . I&D EXTREMITY Right 03/18/2017   Procedure: IRRIGATION AND DEBRIDEMENT EXTREMITY;  Surgeon: [name], MD;  Location: [place];  Service: Orthopedics;  Laterality: Right;  . MINOR IRRIGATION AND DEBRIDEMENT OF WOUND Right 04/22/2014   Procedure: IRRIGATION AND DEBRIDEMENT OF RIGHT NECK ABCESS;  Surgeon: Jerrell Belfast, MD;  Location: [place];  Service: ENT;  Laterality: Right;  . MULTIPLE EXTRACTIONS WITH ALVEOLOPLASTY N/A 01/18/2013   Procedure: MULTIPLE EXTRACION 3, 6, 7, 10, 11, 13, 21, 22, 27, 28, 29, 30 WITH ALVEOLOPLASTY;  Surgeon: Gae Bon, DDS;  Location: [place];  Service: Oral Surgery;  Laterality: N/A;  . SKIN SPLIT GRAFT Right 03/21/2017   Procedure: IRRIGATION AND DEBRIDEMENT RIGHT FOOT AND APPLY SPLIT THICKNESS SKIN GRAFT AND WOUND VAC;  Surgeon: [name], MD;  Location: [place];  Service: Orthopedics;  Laterality: Right;  . TEE WITHOUT CARDIOVERSION N/A 05/02/2017   Procedure: TRANSESOPHAGEAL ECHOCARDIOGRAM (TEE);  Surgeon: Acie Fredrickson Wonda Cheng, MD;  Location: [facility];  Service: Cardiovascular;  Laterality: N/A;  coincidental to orthopedic case   Social History   Occupational History  . Occupation: disabled  Tobacco Use  . Smoking status:  Never Smoker  . Smokeless tobacco: Never Used  Substance and Sexual Activity  . Alcohol use: No    Alcohol/week: 0.0 standard drinks  . Drug use: Not Currently    Comment: OTC ASA  . Sexual activity: Yes    Partners: Female    Comment: given condoms

## 2018-07-08 DIAGNOSIS — Z79899 Other long term (current) drug therapy: Secondary | ICD-10-CM | POA: Diagnosis not present

## 2018-07-08 DIAGNOSIS — M545 Low back pain: Secondary | ICD-10-CM | POA: Diagnosis not present

## 2018-07-08 DIAGNOSIS — N289 Disorder of kidney and ureter, unspecified: Secondary | ICD-10-CM | POA: Diagnosis not present

## 2018-07-09 ENCOUNTER — Telehealth (INDEPENDENT_AMBULATORY_CARE_PROVIDER_SITE_OTHER): Payer: Self-pay

## 2018-07-09 ENCOUNTER — Telehealth: Payer: Self-pay

## 2018-07-09 DIAGNOSIS — L97222 Non-pressure chronic ulcer of left calf with fat layer exposed: Secondary | ICD-10-CM | POA: Diagnosis not present

## 2018-07-09 DIAGNOSIS — E1151 Type 2 diabetes mellitus with diabetic peripheral angiopathy without gangrene: Secondary | ICD-10-CM | POA: Diagnosis not present

## 2018-07-09 DIAGNOSIS — I872 Venous insufficiency (chronic) (peripheral): Secondary | ICD-10-CM | POA: Diagnosis not present

## 2018-07-09 DIAGNOSIS — B9562 Methicillin resistant Staphylococcus aureus infection as the cause of diseases classified elsewhere: Secondary | ICD-10-CM | POA: Diagnosis not present

## 2018-07-09 DIAGNOSIS — Z4800 Encounter for change or removal of nonsurgical wound dressing: Secondary | ICD-10-CM | POA: Diagnosis not present

## 2018-07-09 DIAGNOSIS — E1142 Type 2 diabetes mellitus with diabetic polyneuropathy: Secondary | ICD-10-CM | POA: Diagnosis not present

## 2018-07-09 MED ORDER — AMLODIPINE BESYLATE 10 MG PO TABS: 10.0000 mg | ORAL_TABLET | Freq: Every day | ORAL | 2 refills | Status: DC

## 2018-07-09 NOTE — Telephone Encounter (Signed)
Patient has not been taking his blood pressure medication and Keith [name] the [facility] nurse states that he blood pressure was 154/96 in right arm and 160/110 in left arm. Mom states that it was 196/127 last night and patient had a headache and dizziness. Amlodipine has been sent to pharmacy for patient. There are aware that you are out of office.Please advise .

## 2018-07-09 NOTE — Telephone Encounter (Signed)
See message below °

## 2018-07-09 NOTE — Telephone Encounter (Signed)
Dressing change 3x week, silvadine plus 4x4 plus ace wrap, use compression stump shrinker over dressing

## 2018-07-09 NOTE — Telephone Encounter (Signed)
Tracey with [facility] called stated that she needed orders for wound care for patients right stump. Please call [phone]

## 2018-07-10 ENCOUNTER — Other Ambulatory Visit (INDEPENDENT_AMBULATORY_CARE_PROVIDER_SITE_OTHER): Payer: Self-pay

## 2018-07-10 ENCOUNTER — Telehealth: Payer: Self-pay | Admitting: Family Medicine

## 2018-07-10 ENCOUNTER — Telehealth (INDEPENDENT_AMBULATORY_CARE_PROVIDER_SITE_OTHER): Payer: Self-pay

## 2018-07-10 MED ORDER — MUPIROCIN 2 % EX OINT: 1.0000 "application " | TOPICAL_OINTMENT | Freq: Two times a day (BID) | CUTANEOUS | 0 refills | Status: DC

## 2018-07-10 NOTE — Telephone Encounter (Signed)
Dr Sharol Given, [facility] S[facility] advised Bactroban due to pt is alletrgic to sulfa, FYI

## 2018-07-10 NOTE — Telephone Encounter (Signed)
Advise patient nurse for patient to go to hospital for elevated readings and headaches and dizziness. Nurse agreed with plan. Also sent message to provider

## 2018-07-10 NOTE — Telephone Encounter (Signed)
I tried to call pt and can not leave message for him to know that his Rx for Bactroban was sent to pharmacy. Will try to call again and Tracey from [facility] was informed of this change.

## 2018-07-10 NOTE — Telephone Encounter (Signed)
We have been attempting to contact patient several times today and yesterday. Spoke to patient today concerning elevated blood pressures, which he r/t eating pork at that time. He had recent readings of 154/96, 160/110, and 196/127.  Patient will re-start taking hypertensive medications today. He states that did report to ED yesterday, but he did not want to wait and left. He states that he has been feeling well with no complaints. He will continue to decrease high sodium intake, excessive alcohol intake, increase potassium intake, smoking cessation, and increase physical activity of at least 30 minutes of cardio activity daily. He will continue to follow Heart Healthy or DASH diet. He will report to ED if he eperiences severe headaches, confusion, seizures, double vision, and blurred vision, nausea and vomiting. Verbalized understanding.   Kathe Becton,  MSN, FNP-C [facility] [address] [place], [place] [address] [phone]

## 2018-07-11 ENCOUNTER — Other Ambulatory Visit: Payer: Self-pay | Admitting: Infectious Diseases

## 2018-07-11 DIAGNOSIS — B20 Human immunodeficiency virus [HIV] disease: Secondary | ICD-10-CM

## 2018-07-13 DIAGNOSIS — L97222 Non-pressure chronic ulcer of left calf with fat layer exposed: Secondary | ICD-10-CM | POA: Diagnosis not present

## 2018-07-13 DIAGNOSIS — B9562 Methicillin resistant Staphylococcus aureus infection as the cause of diseases classified elsewhere: Secondary | ICD-10-CM | POA: Diagnosis not present

## 2018-07-13 DIAGNOSIS — E1151 Type 2 diabetes mellitus with diabetic peripheral angiopathy without gangrene: Secondary | ICD-10-CM | POA: Diagnosis not present

## 2018-07-13 DIAGNOSIS — I872 Venous insufficiency (chronic) (peripheral): Secondary | ICD-10-CM | POA: Diagnosis not present

## 2018-07-13 DIAGNOSIS — E1142 Type 2 diabetes mellitus with diabetic polyneuropathy: Secondary | ICD-10-CM | POA: Diagnosis not present

## 2018-07-13 DIAGNOSIS — Z4800 Encounter for change or removal of nonsurgical wound dressing: Secondary | ICD-10-CM | POA: Diagnosis not present

## 2018-07-15 DIAGNOSIS — E1151 Type 2 diabetes mellitus with diabetic peripheral angiopathy without gangrene: Secondary | ICD-10-CM | POA: Diagnosis not present

## 2018-07-15 DIAGNOSIS — F411 Generalized anxiety disorder: Secondary | ICD-10-CM | POA: Diagnosis not present

## 2018-07-15 DIAGNOSIS — L97222 Non-pressure chronic ulcer of left calf with fat layer exposed: Secondary | ICD-10-CM | POA: Diagnosis not present

## 2018-07-15 DIAGNOSIS — B9562 Methicillin resistant Staphylococcus aureus infection as the cause of diseases classified elsewhere: Secondary | ICD-10-CM | POA: Diagnosis not present

## 2018-07-15 DIAGNOSIS — I872 Venous insufficiency (chronic) (peripheral): Secondary | ICD-10-CM | POA: Diagnosis not present

## 2018-07-15 DIAGNOSIS — E1142 Type 2 diabetes mellitus with diabetic polyneuropathy: Secondary | ICD-10-CM | POA: Diagnosis not present

## 2018-07-15 DIAGNOSIS — F329 Major depressive disorder, single episode, unspecified: Secondary | ICD-10-CM | POA: Diagnosis not present

## 2018-07-15 DIAGNOSIS — Z4800 Encounter for change or removal of nonsurgical wound dressing: Secondary | ICD-10-CM | POA: Diagnosis not present

## 2018-07-17 ENCOUNTER — Ambulatory Visit: Payer: Medicare Other | Admitting: Family Medicine

## 2018-07-17 DIAGNOSIS — I872 Venous insufficiency (chronic) (peripheral): Secondary | ICD-10-CM | POA: Diagnosis not present

## 2018-07-17 DIAGNOSIS — L97222 Non-pressure chronic ulcer of left calf with fat layer exposed: Secondary | ICD-10-CM | POA: Diagnosis not present

## 2018-07-17 DIAGNOSIS — E1142 Type 2 diabetes mellitus with diabetic polyneuropathy: Secondary | ICD-10-CM | POA: Diagnosis not present

## 2018-07-17 DIAGNOSIS — B9562 Methicillin resistant Staphylococcus aureus infection as the cause of diseases classified elsewhere: Secondary | ICD-10-CM | POA: Diagnosis not present

## 2018-07-17 DIAGNOSIS — Z4800 Encounter for change or removal of nonsurgical wound dressing: Secondary | ICD-10-CM | POA: Diagnosis not present

## 2018-07-17 DIAGNOSIS — E1151 Type 2 diabetes mellitus with diabetic peripheral angiopathy without gangrene: Secondary | ICD-10-CM | POA: Diagnosis not present

## 2018-07-22 ENCOUNTER — Telehealth (INDEPENDENT_AMBULATORY_CARE_PROVIDER_SITE_OTHER): Payer: Self-pay | Admitting: Orthopedic Surgery

## 2018-07-22 NOTE — Telephone Encounter (Signed)
I called patient and informed him that [facility] is whom he needs to speak to at [phone].

## 2018-07-22 NOTE — Telephone Encounter (Signed)
Patient called stating that he needs the phone number to the wound care people.  He does not remember the name of the facility.  He would like you to call him back on [phone].  Thank you.

## 2018-07-23 DIAGNOSIS — Z4800 Encounter for change or removal of nonsurgical wound dressing: Secondary | ICD-10-CM | POA: Diagnosis not present

## 2018-07-23 DIAGNOSIS — B9562 Methicillin resistant Staphylococcus aureus infection as the cause of diseases classified elsewhere: Secondary | ICD-10-CM | POA: Diagnosis not present

## 2018-07-23 DIAGNOSIS — E1151 Type 2 diabetes mellitus with diabetic peripheral angiopathy without gangrene: Secondary | ICD-10-CM | POA: Diagnosis not present

## 2018-07-23 DIAGNOSIS — I872 Venous insufficiency (chronic) (peripheral): Secondary | ICD-10-CM | POA: Diagnosis not present

## 2018-07-23 DIAGNOSIS — L97222 Non-pressure chronic ulcer of left calf with fat layer exposed: Secondary | ICD-10-CM | POA: Diagnosis not present

## 2018-07-23 DIAGNOSIS — E1142 Type 2 diabetes mellitus with diabetic polyneuropathy: Secondary | ICD-10-CM | POA: Diagnosis not present

## 2018-07-26 DIAGNOSIS — I10 Essential (primary) hypertension: Secondary | ICD-10-CM | POA: Diagnosis not present

## 2018-07-26 DIAGNOSIS — E1122 Type 2 diabetes mellitus with diabetic chronic kidney disease: Secondary | ICD-10-CM | POA: Diagnosis not present

## 2018-07-26 DIAGNOSIS — Z89511 Acquired absence of right leg below knee: Secondary | ICD-10-CM | POA: Diagnosis not present

## 2018-07-26 DIAGNOSIS — E669 Obesity, unspecified: Secondary | ICD-10-CM | POA: Diagnosis not present

## 2018-07-26 DIAGNOSIS — N183 Chronic kidney disease, stage 3 (moderate): Secondary | ICD-10-CM | POA: Diagnosis not present

## 2018-07-26 DIAGNOSIS — I872 Venous insufficiency (chronic) (peripheral): Secondary | ICD-10-CM | POA: Diagnosis not present

## 2018-07-26 DIAGNOSIS — Z794 Long term (current) use of insulin: Secondary | ICD-10-CM | POA: Diagnosis not present

## 2018-07-26 DIAGNOSIS — Z6841 Body Mass Index (BMI) 40.0 and over, adult: Secondary | ICD-10-CM | POA: Diagnosis not present

## 2018-07-26 DIAGNOSIS — L89892 Pressure ulcer of other site, stage 2: Secondary | ICD-10-CM | POA: Diagnosis not present

## 2018-07-26 DIAGNOSIS — E1151 Type 2 diabetes mellitus with diabetic peripheral angiopathy without gangrene: Secondary | ICD-10-CM | POA: Diagnosis not present

## 2018-07-26 DIAGNOSIS — E1142 Type 2 diabetes mellitus with diabetic polyneuropathy: Secondary | ICD-10-CM | POA: Diagnosis not present

## 2018-07-26 DIAGNOSIS — Z21 Asymptomatic human immunodeficiency virus [HIV] infection status: Secondary | ICD-10-CM | POA: Diagnosis not present

## 2018-07-27 ENCOUNTER — Ambulatory Visit (INDEPENDENT_AMBULATORY_CARE_PROVIDER_SITE_OTHER): Payer: Medicare Other | Admitting: Orthopedic Surgery

## 2018-07-27 DIAGNOSIS — L89892 Pressure ulcer of other site, stage 2: Secondary | ICD-10-CM | POA: Diagnosis not present

## 2018-07-27 DIAGNOSIS — Z794 Long term (current) use of insulin: Secondary | ICD-10-CM | POA: Diagnosis not present

## 2018-07-27 DIAGNOSIS — E1151 Type 2 diabetes mellitus with diabetic peripheral angiopathy without gangrene: Secondary | ICD-10-CM | POA: Diagnosis not present

## 2018-07-27 DIAGNOSIS — I10 Essential (primary) hypertension: Secondary | ICD-10-CM | POA: Diagnosis not present

## 2018-07-27 DIAGNOSIS — E1122 Type 2 diabetes mellitus with diabetic chronic kidney disease: Secondary | ICD-10-CM | POA: Diagnosis not present

## 2018-07-27 DIAGNOSIS — E1142 Type 2 diabetes mellitus with diabetic polyneuropathy: Secondary | ICD-10-CM | POA: Diagnosis not present

## 2018-07-29 DIAGNOSIS — E1122 Type 2 diabetes mellitus with diabetic chronic kidney disease: Secondary | ICD-10-CM | POA: Diagnosis not present

## 2018-07-29 DIAGNOSIS — E1151 Type 2 diabetes mellitus with diabetic peripheral angiopathy without gangrene: Secondary | ICD-10-CM | POA: Diagnosis not present

## 2018-07-29 DIAGNOSIS — E1142 Type 2 diabetes mellitus with diabetic polyneuropathy: Secondary | ICD-10-CM | POA: Diagnosis not present

## 2018-07-29 DIAGNOSIS — L89892 Pressure ulcer of other site, stage 2: Secondary | ICD-10-CM | POA: Diagnosis not present

## 2018-07-29 DIAGNOSIS — Z794 Long term (current) use of insulin: Secondary | ICD-10-CM | POA: Diagnosis not present

## 2018-07-29 DIAGNOSIS — I10 Essential (primary) hypertension: Secondary | ICD-10-CM | POA: Diagnosis not present

## 2018-07-30 ENCOUNTER — Telehealth: Payer: Self-pay

## 2018-07-30 ENCOUNTER — Other Ambulatory Visit: Payer: Self-pay | Admitting: Family Medicine

## 2018-07-30 DIAGNOSIS — Z794 Long term (current) use of insulin: Secondary | ICD-10-CM

## 2018-07-30 DIAGNOSIS — E1169 Type 2 diabetes mellitus with other specified complication: Secondary | ICD-10-CM

## 2018-07-30 DIAGNOSIS — R350 Frequency of micturition: Secondary | ICD-10-CM

## 2018-07-30 DIAGNOSIS — E11621 Type 2 diabetes mellitus with foot ulcer: Secondary | ICD-10-CM

## 2018-07-30 DIAGNOSIS — E1159 Type 2 diabetes mellitus with other circulatory complications: Secondary | ICD-10-CM

## 2018-07-30 DIAGNOSIS — L97509 Non-pressure chronic ulcer of other part of unspecified foot with unspecified severity: Secondary | ICD-10-CM

## 2018-07-30 MED ORDER — BLOOD GLUCOSE METER KIT: PACK | 0 refills | Status: DC

## 2018-07-30 MED ORDER — OXYBUTYNIN CHLORIDE 5 MG PO TABS: 5.0000 mg | ORAL_TABLET | Freq: Two times a day (BID) | ORAL | 1 refills | Status: DC

## 2018-07-30 NOTE — Telephone Encounter (Signed)
Olivia Mackie from [facility] is stating that patient needs a script sent for a glucose meter and supplies sent to [employer]. Also that patient is having some urinary frequency and he is not having any burning or discomfort. She is aware that you are out of office until Friday

## 2018-07-31 ENCOUNTER — Other Ambulatory Visit: Payer: Self-pay

## 2018-07-31 ENCOUNTER — Telehealth (INDEPENDENT_AMBULATORY_CARE_PROVIDER_SITE_OTHER): Payer: Self-pay

## 2018-07-31 ENCOUNTER — Ambulatory Visit (INDEPENDENT_AMBULATORY_CARE_PROVIDER_SITE_OTHER): Payer: Medicare Other | Admitting: Physician Assistant

## 2018-07-31 ENCOUNTER — Encounter (INDEPENDENT_AMBULATORY_CARE_PROVIDER_SITE_OTHER): Payer: Self-pay | Admitting: Physician Assistant

## 2018-07-31 VITALS — Ht 69.0 in | Wt 274.0 lb

## 2018-07-31 DIAGNOSIS — L97221 Non-pressure chronic ulcer of left calf limited to breakdown of skin: Secondary | ICD-10-CM

## 2018-07-31 DIAGNOSIS — E1142 Type 2 diabetes mellitus with diabetic polyneuropathy: Secondary | ICD-10-CM

## 2018-07-31 DIAGNOSIS — T8789 Other complications of amputation stump: Secondary | ICD-10-CM | POA: Diagnosis not present

## 2018-07-31 DIAGNOSIS — E1122 Type 2 diabetes mellitus with diabetic chronic kidney disease: Secondary | ICD-10-CM | POA: Diagnosis not present

## 2018-07-31 DIAGNOSIS — I872 Venous insufficiency (chronic) (peripheral): Secondary | ICD-10-CM | POA: Diagnosis not present

## 2018-07-31 DIAGNOSIS — L89891 Pressure ulcer of other site, stage 1: Secondary | ICD-10-CM | POA: Diagnosis not present

## 2018-07-31 DIAGNOSIS — Z89511 Acquired absence of right leg below knee: Secondary | ICD-10-CM | POA: Diagnosis not present

## 2018-07-31 DIAGNOSIS — I10 Essential (primary) hypertension: Secondary | ICD-10-CM | POA: Diagnosis not present

## 2018-07-31 DIAGNOSIS — E1169 Type 2 diabetes mellitus with other specified complication: Secondary | ICD-10-CM

## 2018-07-31 DIAGNOSIS — Z794 Long term (current) use of insulin: Secondary | ICD-10-CM | POA: Diagnosis not present

## 2018-07-31 DIAGNOSIS — L89892 Pressure ulcer of other site, stage 2: Secondary | ICD-10-CM | POA: Diagnosis not present

## 2018-07-31 DIAGNOSIS — E1151 Type 2 diabetes mellitus with diabetic peripheral angiopathy without gangrene: Secondary | ICD-10-CM | POA: Diagnosis not present

## 2018-07-31 MED ORDER — BLOOD GLUCOSE METER KIT: PACK | 0 refills | Status: DC

## 2018-07-31 NOTE — Telephone Encounter (Signed)
Patient notified and will pick up medication and schedule 1 month follow up

## 2018-07-31 NOTE — Telephone Encounter (Signed)
Olivia Mackie from [facility] called and stated that she was at patient's home to do his dressing change and his was having a lot of issues with his wound. Patient was advised to come in today w/Shawn to be seen. He will arrive at 10 am.Thank you

## 2018-07-31 NOTE — Progress Notes (Signed)
Office Visit Note   Patient: Keith [name]           Date of Birth: 06-20-1965           MRN: [id number] Visit Date: 07/31/2018              Requested by: [name], [address], [place] [id number] PCP: [name], FNP  Chief Complaint  Patient presents with  . Left Leg - Follow-up  . Right Leg - Follow-up    06/13/17 BKA of right leg      HPI: The patient is a 53 year old gentleman who is seen for follow-up of an ulcer over the residual limb of his right transtibial amputation and ulceration over the left lower leg.  He reports that the left lower leg has healed and he is wearing a medical compression sock.  He does wear prosthetic and reports that he has been trying not to wear this is much as possible and been trying to utilize some Silvadene cream to the area but does not feel that this is improving things.  He reports he is getting home health 3 times weekly for dressing changes.  Assessment & Plan: Visit Diagnoses:  1. Acquired absence of right leg below knee ([place])   2. Pressure ulcer of below knee amputation stump, stage 1 (HCC)   3. Venous stasis ulcer of left calf limited to breakdown of skin without varicose veins (HCC)   4. Diabetic polyneuropathy associated with type 2 diabetes mellitus ([place])     Plan: Recommend there are honey to the open areas x2 over the right transtibial amputation site and cover with gauze and Ace wrap.  Recommend he continue to stay out of his prosthetic as much as possible.  Continue medical stocking to the left lower extremity for edema control.  His ulcers here if healed.  He will follow-up here in 2 weeks.  Follow-Up Instructions: Return in about 2 weeks (around 08/14/2018).   Ortho Exam  Patient is alert, oriented, no adenopathy, well-dressed, normal affect, normal respiratory effort. The left lower extremity ulcerations have healed.  He has some minor crusting over the area.  He is wearing his medical  compression stockings as instructed.  There are no signs of infection or cellulitis. The right lower extremity was residual limb has 2 small ulcers which are approximately 1 cm in diameter each.  They are very superficial involving the skin only.  There is no signs of infection or cellulitis in the residual limb.  Imaging: No results found.    Labs: Lab Results  Component Value Date   HGBA1C 7.8 (H) 06/06/2018   HGBA1C 6.4 (A) 01/06/2018   HGBA1C 6.4 (A) 11/04/2017   ESRSEDRATE 74 (H) 06/06/2018   ESRSEDRATE 140 (H) 04/28/2017   ESRSEDRATE 127 (H) 03/15/2017   CRP <0.8 06/06/2018   CRP 14.9 (H) 04/28/2017   CRP 23.1 (H) 03/15/2017   LABURIC 6.3 08/07/2009   REPTSTATUS 06/11/2018 FINAL 06/06/2018   GRAMSTAIN  04/29/2017    NO WBC SEEN RARE GRAM POSITIVE COCCI IN PAIRS IN SINGLES    CULT  06/06/2018    NO GROWTH 5 DAYS Performed at [facility], [place] [address] [place],  [address]    LABORGA METHICILLIN RESISTANT STAPHYLOCOCCUS AUREUS 04/29/2017     Lab Results  Component Value Date   ALBUMIN 3.6 06/05/2018   ALBUMIN 3.6 08/19/2017   ALBUMIN 3.0 (L) 04/28/2017   PREALBUMIN 25.5 06/06/2018  PREALBUMIN 7.4 (L) 03/15/2017   LABURIC 6.3 08/07/2009    Body mass index is 40.46 kg/m.  Orders:  No orders of the defined types were placed in this encounter.  No orders of the defined types were placed in this encounter.    Procedures: No procedures performed  Clinical Data: No additional findings.  ROS:  All other systems negative, except as noted in the HPI. Review of Systems  Objective: Vital Signs: Ht 5\' 9"  (1.753 m)   Wt 274 lb (124.3 kg)   BMI 40.46 kg/m   Specialty Comments:  No specialty comments available.  PMFS History: Patient Active Problem List   Diagnosis Date Noted  . Non-pressure chronic ulcer of right calf limited to breakdown of skin ([place]) 07/06/2018  . Type 2 diabetes mellitus without complication, without long-term  current use of insulin ([place]) 06/15/2018  . Chronic low back pain without sciatica 06/15/2018  . Wound of left leg 06/15/2018  . Idiopathic chronic venous hypertension of left lower extremity with ulcer and inflammation ([place])   . Venous stasis ulcer of left calf limited to breakdown of skin without varicose veins ([place]) 04/23/2018  . Acquired absence of right leg below knee ([place]) 06/13/2017  . S/P transmetatarsal amputation of foot, right ([place]) 05/07/2017  . Wound infection   . Bacteremia   . Fever   . Sepsis ([place]) 03/15/2017  . CKD (chronic kidney disease) stage 3, GFR 30-59 ml/min (HCC) 03/15/2017  . Diabetic polyneuropathy associated with type 2 diabetes mellitus ([place]) 01/23/2017  . AKI (acute kidney injury) ([place]) 09/28/2016  . Nausea vomiting and diarrhea 09/28/2016  . Onychomycosis of multiple toenails with type 2 diabetes mellitus ([place]) 08/29/2015  . MRSA carrier 04/20/2014  . Testosterone deficiency 04/20/2014  . Penile wart 02/22/2014  . HIV disease ([place])   . Insulin-requiring or dependent type II diabetes mellitus ([place]) 02/04/2014  . Dental anomaly 11/20/2012  . Arthritis of right knee 02/23/2012  . Hyperlipidemia 11/10/2011  . Chronic pain 08/07/2011  . Meralgia paraesthetica 04/23/2011  . ERECTILE DYSFUNCTION 08/22/2008  . Essential hypertension 05/19/2008   Past Medical History:  Diagnosis Date  . Abscess of right foot   . AIDS ([place])   . Chronic knee pain    right  . Chronic pain   . Diabetes type 2, uncontrolled ([place])    HgA1c 17.6 (04/27/2010)  . Diabetic foot ulcer ([place]) 01/2017   right foot  . Erectile dysfunction   . Genital warts   . HIV (human immunodeficiency virus infection) ([place]) 2009   CD4 count 100, VL 13800 (05/01/2010)  . Hypertension   . Osteomyelitis ([place])    h/o hand  . Osteomyelitis of metatarsal ([place]) 04/28/2017    Family History  Problem Relation Age of Onset  . Hypertension Mother   . Arthritis Father   . Hypertension Father   .  Hypertension Brother   . Cancer Maternal Grandmother 73       unknown type of cancer  . Depression Paternal Grandmother     Past Surgical History:  Procedure Laterality Date  . AMPUTATION Right 05/02/2017   Procedure: AMPUTATION TRANSMETARSAL;  Surgeon: [name], MD;  Location: [facility];  Service: Orthopedics;  Laterality: Right;  . AMPUTATION Right 06/13/2017   Procedure: RIGHT BELOW KNEE AMPUTATION;  Surgeon: [name], MD;  Location: [facility];  Service: Orthopedics;  Laterality: Right;  . BELOW KNEE LEG AMPUTATION Right 06/13/2017  . HERNIA REPAIR    . I&D EXTREMITY Left 08/21/2014  Procedure: INCISION AND DRAINAGE LEFT SMALL FINGER;  Surgeon: Leanora Cover, MD;  Location: [facility];  Service: Orthopedics;  Laterality: Left;  . I&D EXTREMITY Right 03/18/2017   Procedure: IRRIGATION AND DEBRIDEMENT EXTREMITY;  Surgeon: [name], MD;  Location: [facility];  Service: Orthopedics;  Laterality: Right;  . MINOR IRRIGATION AND DEBRIDEMENT OF WOUND Right 04/22/2014   Procedure: IRRIGATION AND DEBRIDEMENT OF RIGHT NECK ABCESS;  Surgeon: Jerrell Belfast, MD;  Location: [facility];  Service: ENT;  Laterality: Right;  . MULTIPLE EXTRACTIONS WITH ALVEOLOPLASTY N/A 01/18/2013   Procedure: MULTIPLE EXTRACION 3, 6, 7, 10, 11, 13, 21, 22, 27, 28, 29, 30 WITH ALVEOLOPLASTY;  Surgeon: Gae Bon, DDS;  Location: [place] [facility];  Service: Oral Surgery;  Laterality: N/A;  . SKIN SPLIT GRAFT Right 03/21/2017   Procedure: IRRIGATION AND DEBRIDEMENT RIGHT FOOT AND APPLY SPLIT THICKNESS SKIN GRAFT AND WOUND VAC;  Surgeon: [name], MD;  Location: [facility];  Service: Orthopedics;  Laterality: Right;  . TEE WITHOUT CARDIOVERSION N/A 05/02/2017   Procedure: TRANSESOPHAGEAL ECHOCARDIOGRAM (TEE);  Surgeon: Acie Fredrickson Wonda Cheng, MD;  Location: [facility] OR;  Service: Cardiovascular;  Laterality: N/A;  coincidental to orthopedic case   Social History   Occupational History  . Occupation: disabled  Tobacco Use  . Smoking status: Never  Smoker  . Smokeless tobacco: Never Used  Substance and Sexual Activity  . Alcohol use: No    Alcohol/week: 0.0 standard drinks  . Drug use: Not Currently    Comment: OTC ASA  . Sexual activity: Yes    Partners: Female    Comment: given condoms

## 2018-08-03 DIAGNOSIS — L89892 Pressure ulcer of other site, stage 2: Secondary | ICD-10-CM | POA: Diagnosis not present

## 2018-08-03 DIAGNOSIS — E1122 Type 2 diabetes mellitus with diabetic chronic kidney disease: Secondary | ICD-10-CM | POA: Diagnosis not present

## 2018-08-03 DIAGNOSIS — E1142 Type 2 diabetes mellitus with diabetic polyneuropathy: Secondary | ICD-10-CM | POA: Diagnosis not present

## 2018-08-03 DIAGNOSIS — Z794 Long term (current) use of insulin: Secondary | ICD-10-CM | POA: Diagnosis not present

## 2018-08-03 DIAGNOSIS — E1151 Type 2 diabetes mellitus with diabetic peripheral angiopathy without gangrene: Secondary | ICD-10-CM | POA: Diagnosis not present

## 2018-08-03 DIAGNOSIS — I10 Essential (primary) hypertension: Secondary | ICD-10-CM | POA: Diagnosis not present

## 2018-08-07 ENCOUNTER — Observation Stay (HOSPITAL_COMMUNITY)
Admission: EM | Admit: 2018-08-07 | Discharge: 2018-08-08 | Disposition: A | Discharge status: Home / Self Care | Payer: Medicare Other | Attending: Student | Admitting: Student

## 2018-08-07 ENCOUNTER — Other Ambulatory Visit: Payer: Self-pay

## 2018-08-07 ENCOUNTER — Encounter (HOSPITAL_COMMUNITY): Payer: Self-pay | Admitting: Emergency Medicine

## 2018-08-07 DIAGNOSIS — Z794 Long term (current) use of insulin: Secondary | ICD-10-CM | POA: Diagnosis not present

## 2018-08-07 DIAGNOSIS — I10 Essential (primary) hypertension: Secondary | ICD-10-CM | POA: Diagnosis present

## 2018-08-07 DIAGNOSIS — E871 Hypo-osmolality and hyponatremia: Secondary | ICD-10-CM | POA: Insufficient documentation

## 2018-08-07 DIAGNOSIS — Z8249 Family history of ischemic heart disease and other diseases of the circulatory system: Secondary | ICD-10-CM | POA: Insufficient documentation

## 2018-08-07 DIAGNOSIS — Y835 Amputation of limb(s) as the cause of abnormal reaction of the patient, or of later complication, without mention of misadventure at the time of the procedure: Secondary | ICD-10-CM | POA: Insufficient documentation

## 2018-08-07 DIAGNOSIS — Z89511 Acquired absence of right leg below knee: Secondary | ICD-10-CM | POA: Diagnosis not present

## 2018-08-07 DIAGNOSIS — E1159 Type 2 diabetes mellitus with other circulatory complications: Secondary | ICD-10-CM

## 2018-08-07 DIAGNOSIS — E1100 Type 2 diabetes mellitus with hyperosmolarity without nonketotic hyperglycemic-hyperosmolar coma (NKHHC): Principal | ICD-10-CM | POA: Insufficient documentation

## 2018-08-07 DIAGNOSIS — F329 Major depressive disorder, single episode, unspecified: Secondary | ICD-10-CM | POA: Diagnosis not present

## 2018-08-07 DIAGNOSIS — N521 Erectile dysfunction due to diseases classified elsewhere: Secondary | ICD-10-CM

## 2018-08-07 DIAGNOSIS — Z791 Long term (current) use of non-steroidal anti-inflammatories (NSAID): Secondary | ICD-10-CM | POA: Insufficient documentation

## 2018-08-07 DIAGNOSIS — M868X7 Other osteomyelitis, ankle and foot: Secondary | ICD-10-CM | POA: Diagnosis not present

## 2018-08-07 DIAGNOSIS — E1165 Type 2 diabetes mellitus with hyperglycemia: Secondary | ICD-10-CM | POA: Diagnosis not present

## 2018-08-07 DIAGNOSIS — R739 Hyperglycemia, unspecified: Secondary | ICD-10-CM

## 2018-08-07 DIAGNOSIS — N183 Chronic kidney disease, stage 3 unspecified: Secondary | ICD-10-CM | POA: Diagnosis present

## 2018-08-07 DIAGNOSIS — Z79899 Other long term (current) drug therapy: Secondary | ICD-10-CM | POA: Diagnosis not present

## 2018-08-07 DIAGNOSIS — Z9114 Patient's other noncompliance with medication regimen: Secondary | ICD-10-CM | POA: Insufficient documentation

## 2018-08-07 DIAGNOSIS — R778 Other specified abnormalities of plasma proteins: Secondary | ICD-10-CM

## 2018-08-07 DIAGNOSIS — Z882 Allergy status to sulfonamides status: Secondary | ICD-10-CM | POA: Insufficient documentation

## 2018-08-07 DIAGNOSIS — E1122 Type 2 diabetes mellitus with diabetic chronic kidney disease: Secondary | ICD-10-CM | POA: Insufficient documentation

## 2018-08-07 DIAGNOSIS — N184 Chronic kidney disease, stage 4 (severe): Secondary | ICD-10-CM | POA: Diagnosis present

## 2018-08-07 DIAGNOSIS — I129 Hypertensive chronic kidney disease with stage 1 through stage 4 chronic kidney disease, or unspecified chronic kidney disease: Secondary | ICD-10-CM | POA: Diagnosis not present

## 2018-08-07 DIAGNOSIS — N529 Male erectile dysfunction, unspecified: Secondary | ICD-10-CM | POA: Diagnosis not present

## 2018-08-07 DIAGNOSIS — I214 Non-ST elevation (NSTEMI) myocardial infarction: Secondary | ICD-10-CM

## 2018-08-07 DIAGNOSIS — B20 Human immunodeficiency virus [HIV] disease: Secondary | ICD-10-CM | POA: Insufficient documentation

## 2018-08-07 DIAGNOSIS — I249 Acute ischemic heart disease, unspecified: Secondary | ICD-10-CM

## 2018-08-07 DIAGNOSIS — T8189XA Other complications of procedures, not elsewhere classified, initial encounter: Secondary | ICD-10-CM | POA: Diagnosis not present

## 2018-08-07 DIAGNOSIS — F419 Anxiety disorder, unspecified: Secondary | ICD-10-CM | POA: Diagnosis not present

## 2018-08-07 DIAGNOSIS — N179 Acute kidney failure, unspecified: Secondary | ICD-10-CM | POA: Diagnosis not present

## 2018-08-07 DIAGNOSIS — M868X4 Other osteomyelitis, hand: Secondary | ICD-10-CM | POA: Insufficient documentation

## 2018-08-07 DIAGNOSIS — E1129 Type 2 diabetes mellitus with other diabetic kidney complication: Secondary | ICD-10-CM | POA: Diagnosis present

## 2018-08-07 DIAGNOSIS — Z9189 Other specified personal risk factors, not elsewhere classified: Secondary | ICD-10-CM | POA: Diagnosis not present

## 2018-08-07 DIAGNOSIS — N189 Chronic kidney disease, unspecified: Secondary | ICD-10-CM | POA: Diagnosis present

## 2018-08-07 DIAGNOSIS — M545 Low back pain: Secondary | ICD-10-CM | POA: Diagnosis not present

## 2018-08-07 LAB — URINALYSIS, ROUTINE W REFLEX MICROSCOPIC
Bacteria, UA: NONE SEEN
Bilirubin Urine: NEGATIVE
Glucose, UA: >=500 mg/dL — AB
Ketones, ur: NEGATIVE mg/dL
Leukocytes,Ua: NEGATIVE
Nitrite: NEGATIVE
Protein, ur: 30 mg/dL — AB
Specific Gravity, Urine: 1.018 (ref 1.005–1.030)
pH: 6 (ref 5.0–8.0)

## 2018-08-07 LAB — CBC WITH DIFFERENTIAL/PLATELET
Abs Immature Granulocytes: 0.01 10*3/uL (ref 0.00–0.07)
Basophils Absolute: 0 10*3/uL (ref 0.0–0.1)
Basophils Relative: 0 %
Eosinophils Absolute: 0.2 10*3/uL (ref 0.0–0.5)
Eosinophils Relative: 4 %
HCT: 36.1 % — ABNORMAL LOW (ref 39.0–52.0)
Hemoglobin: 11.9 g/dL — ABNORMAL LOW (ref 13.0–17.0)
Immature Granulocytes: 0 %
Lymphocytes Relative: 28 %
Lymphs Abs: 1.8 10*3/uL (ref 0.7–4.0)
MCH: 26.9 pg (ref 26.0–34.0)
MCHC: 33 g/dL (ref 30.0–36.0)
MCV: 81.7 fL (ref 80.0–100.0)
Monocytes Absolute: 0.5 10*3/uL (ref 0.1–1.0)
Monocytes Relative: 7 %
Neutro Abs: 3.9 10*3/uL (ref 1.7–7.7)
Neutrophils Relative %: 61 %
Platelets: 191 10*3/uL (ref 150–400)
RBC: 4.42 MIL/uL (ref 4.22–5.81)
RDW: 13 % (ref 11.5–15.5)
WBC: 6.5 10*3/uL (ref 4.0–10.5)
nRBC: 0 % (ref 0.0–0.2)

## 2018-08-07 LAB — POCT I-STAT EG7
Acid-base deficit: 3 mmol/L — ABNORMAL HIGH (ref 0.0–2.0)
Bicarbonate: 22.3 mmol/L (ref 20.0–28.0)
Calcium, Ion: 1.17 mmol/L (ref 1.15–1.40)
HCT: 38 % — ABNORMAL LOW (ref 39.0–52.0)
Hemoglobin: 12.9 g/dL — ABNORMAL LOW (ref 13.0–17.0)
O2 Saturation: 52 %
Patient temperature: 37
Potassium: 4.7 mmol/L (ref 3.5–5.1)
Sodium: 119 mmol/L — CL (ref 135–145)
TCO2: 24 mmol/L (ref 22–32)
pCO2, Ven: 41.4 mmHg — ABNORMAL LOW (ref 44.0–60.0)
pH, Ven: 7.338 (ref 7.250–7.430)
pO2, Ven: 30 mmHg — CL (ref 32.0–45.0)

## 2018-08-07 LAB — BASIC METABOLIC PANEL
Anion gap: 11 (ref 5–15)
Anion gap: 11 (ref 5–15)
BUN: 34 mg/dL — ABNORMAL HIGH (ref 6–20)
BUN: 29 mg/dL — ABNORMAL HIGH (ref 6–20)
CO2: 20 mmol/L — ABNORMAL LOW (ref 22–32)
CO2: 22 mmol/L (ref 22–32)
Calcium: 9.2 mg/dL (ref 8.9–10.3)
Calcium: 9.1 mg/dL (ref 8.9–10.3)
Chloride: 85 mmol/L — ABNORMAL LOW (ref 98–111)
Chloride: 99 mmol/L (ref 98–111)
Creatinine, Ser: 2.42 mg/dL — ABNORMAL HIGH (ref 0.61–1.24)
Creatinine, Ser: 2.15 mg/dL — ABNORMAL HIGH (ref 0.61–1.24)
GFR calc Af Amer: 34 mL/min — ABNORMAL LOW (ref >60)
GFR calc Af Amer: 39 mL/min — ABNORMAL LOW (ref >60)
GFR calc non Af Amer: 29 mL/min — ABNORMAL LOW (ref >60)
GFR calc non Af Amer: 34 mL/min — ABNORMAL LOW (ref >60)
Glucose, Bld: 935 mg/dL (ref 70–99)
Glucose, Bld: 155 mg/dL — ABNORMAL HIGH (ref 70–99)
Potassium: 4.8 mmol/L (ref 3.5–5.1)
Potassium: 3.5 mmol/L (ref 3.5–5.1)
Sodium: 116 mmol/L — CL (ref 135–145)
Sodium: 132 mmol/L — ABNORMAL LOW (ref 135–145)

## 2018-08-07 LAB — GLUCOSE, CAPILLARY
Glucose-Capillary: 162 mg/dL — ABNORMAL HIGH (ref 70–99)
Glucose-Capillary: 156 mg/dL — ABNORMAL HIGH (ref 70–99)
Glucose-Capillary: 155 mg/dL — ABNORMAL HIGH (ref 70–99)
Glucose-Capillary: 162 mg/dL — ABNORMAL HIGH (ref 70–99)
Glucose-Capillary: 178 mg/dL — ABNORMAL HIGH (ref 70–99)
Glucose-Capillary: 338 mg/dL — ABNORMAL HIGH (ref 70–99)
Glucose-Capillary: 257 mg/dL — ABNORMAL HIGH (ref 70–99)

## 2018-08-07 LAB — CBG MONITORING, ED
Glucose-Capillary: >600 mg/dL (ref 70–99)
Glucose-Capillary: 590 mg/dL (ref 70–99)
Glucose-Capillary: 466 mg/dL — ABNORMAL HIGH (ref 70–99)
Glucose-Capillary: 261 mg/dL — ABNORMAL HIGH (ref 70–99)

## 2018-08-07 LAB — HEMOGLOBIN A1C
Hgb A1c MFr Bld: 15 % — ABNORMAL HIGH (ref 4.8–5.6)
Mean Plasma Glucose: 383.8 mg/dL

## 2018-08-07 LAB — MRSA PCR SCREENING: MRSA by PCR: POSITIVE — AB

## 2018-08-07 MED ORDER — INSULIN ASPART 100 UNIT/ML ~~LOC~~ SOLN
0.0000 [IU] | Freq: Three times a day (TID) | SUBCUTANEOUS | Status: DC
  Administered 2018-08-07: 11 [IU] via SUBCUTANEOUS
  Administered 2018-08-07 – 2018-08-08 (×2): 3 [IU] via SUBCUTANEOUS

## 2018-08-07 MED ORDER — ONDANSETRON HCL 4 MG PO TABS: 4.0000 mg | ORAL_TABLET | Freq: Four times a day (QID) | ORAL | Status: DC | PRN

## 2018-08-07 MED ORDER — INSULIN GLARGINE 100 UNIT/ML ~~LOC~~ SOLN
30.0000 [IU] | Freq: Every day | SUBCUTANEOUS | Status: DC
  Administered 2018-08-07 – 2018-08-08 (×2): 30 [IU] via SUBCUTANEOUS
  Filled 2018-08-07 (×3): qty 0.3

## 2018-08-07 MED ORDER — SODIUM CHLORIDE 0.9 % IV SOLN
INTRAVENOUS | Status: DC
  Administered 2018-08-07 (×2): via INTRAVENOUS

## 2018-08-07 MED ORDER — ABACAVIR-DOLUTEGRAVIR-LAMIVUD 600-50-300 MG PO TABS
1.0000 | ORAL_TABLET | Freq: Every day | ORAL | Status: DC
  Administered 2018-08-07 – 2018-08-08 (×2): 1 via ORAL
  Filled 2018-08-07 (×3): qty 1

## 2018-08-07 MED ORDER — DEXTROSE-NACL 5-0.45 % IV SOLN: INTRAVENOUS | Status: DC

## 2018-08-07 MED ORDER — ACETAMINOPHEN 325 MG PO TABS: 650.0000 mg | ORAL_TABLET | Freq: Four times a day (QID) | ORAL | Status: DC | PRN

## 2018-08-07 MED ORDER — ENOXAPARIN SODIUM 60 MG/0.6ML ~~LOC~~ SOLN
60.0000 mg | SUBCUTANEOUS | Status: DC
  Administered 2018-08-07 – 2018-08-08 (×2): 60 mg via SUBCUTANEOUS
  Filled 2018-08-07 (×2): qty 0.6

## 2018-08-07 MED ORDER — ALPRAZOLAM 0.5 MG PO TABS
0.5000 mg | ORAL_TABLET | Freq: Three times a day (TID) | ORAL | Status: DC
  Administered 2018-08-07 – 2018-08-08 (×3): 0.5 mg via ORAL
  Filled 2018-08-07 (×4): qty 1

## 2018-08-07 MED ORDER — SODIUM CHLORIDE 0.9 % IV BOLUS
1000.0000 mL | Freq: Once | INTRAVENOUS | Status: AC
  Administered 2018-08-07: 1000 mL via INTRAVENOUS

## 2018-08-07 MED ORDER — MUPIROCIN 2 % EX OINT
1.0000 "application " | TOPICAL_OINTMENT | Freq: Two times a day (BID) | CUTANEOUS | Status: DC
  Administered 2018-08-07 – 2018-08-08 (×2): 1 via NASAL
  Filled 2018-08-07: qty 22

## 2018-08-07 MED ORDER — ACETAMINOPHEN 650 MG RE SUPP: 650.0000 mg | Freq: Four times a day (QID) | RECTAL | Status: DC | PRN

## 2018-08-07 MED ORDER — OXYCODONE HCL 5 MG PO TABS
10.0000 mg | ORAL_TABLET | Freq: Four times a day (QID) | ORAL | Status: DC
  Administered 2018-08-07 – 2018-08-08 (×4): 10 mg via ORAL
  Filled 2018-08-07 (×5): qty 2

## 2018-08-07 MED ORDER — DARUNAVIR-COBICISTAT 800-150 MG PO TABS
1.0000 | ORAL_TABLET | Freq: Every day | ORAL | Status: DC
  Administered 2018-08-07 – 2018-08-08 (×2): 1 via ORAL
  Filled 2018-08-07 (×4): qty 1

## 2018-08-07 MED ORDER — INSULIN REGULAR(HUMAN) IN NACL 100-0.9 UT/100ML-% IV SOLN
INTRAVENOUS | Status: AC
  Administered 2018-08-07: 5.4 [IU]/h via INTRAVENOUS
  Filled 2018-08-07: qty 100

## 2018-08-07 MED ORDER — SODIUM CHLORIDE 0.9 % IV BOLUS (SEPSIS)
1000.0000 mL | Freq: Once | INTRAVENOUS | Status: AC
  Administered 2018-08-07: 1000 mL via INTRAVENOUS

## 2018-08-07 MED ORDER — OXYBUTYNIN CHLORIDE 5 MG PO TABS
5.0000 mg | ORAL_TABLET | Freq: Two times a day (BID) | ORAL | Status: DC
  Administered 2018-08-07 – 2018-08-08 (×3): 5 mg via ORAL
  Filled 2018-08-07 (×3): qty 1

## 2018-08-07 MED ORDER — AMLODIPINE BESYLATE 5 MG PO TABS
10.0000 mg | ORAL_TABLET | Freq: Once | ORAL | Status: AC
  Administered 2018-08-07: 10 mg via ORAL
  Filled 2018-08-07: qty 2

## 2018-08-07 MED ORDER — INSULIN ASPART 100 UNIT/ML ~~LOC~~ SOLN
0.0000 [IU] | Freq: Every day | SUBCUTANEOUS | Status: DC
  Administered 2018-08-07: 3 [IU] via SUBCUTANEOUS

## 2018-08-07 MED ORDER — HYDRALAZINE HCL 20 MG/ML IJ SOLN: 5.0000 mg | INTRAMUSCULAR | Status: DC | PRN

## 2018-08-07 MED ORDER — AMLODIPINE BESYLATE 10 MG PO TABS
10.0000 mg | ORAL_TABLET | Freq: Every day | ORAL | Status: DC
  Administered 2018-08-07 – 2018-08-08 (×2): 10 mg via ORAL
  Filled 2018-08-07 (×2): qty 1

## 2018-08-07 MED ORDER — ONDANSETRON HCL 4 MG/2ML IJ SOLN: 4.0000 mg | Freq: Four times a day (QID) | INTRAMUSCULAR | Status: DC | PRN

## 2018-08-07 MED ORDER — ATORVASTATIN CALCIUM 10 MG PO TABS
20.0000 mg | ORAL_TABLET | Freq: Every day | ORAL | Status: DC
  Administered 2018-08-07: 20 mg via ORAL
  Filled 2018-08-07: qty 2

## 2018-08-07 MED ORDER — ESCITALOPRAM OXALATE 20 MG PO TABS
20.0000 mg | ORAL_TABLET | Freq: Every day | ORAL | Status: DC
  Administered 2018-08-07 – 2018-08-08 (×2): 20 mg via ORAL
  Filled 2018-08-07 (×2): qty 1

## 2018-08-07 NOTE — H&P (Signed)
History and Physical    Keith [name] ZDG:[id number] DOB: 27-Jul-1965 DOA: 08/07/2018  Referring MD/NP/PA:   PCP: [name], FNP   Patient coming from:  The patient is coming from home.  At baseline, pt is independent for most of ADL.        Chief Complaint: Polyuria, polydipsia, generalized weakness  HPI: Keith [name] is a 53 y.o. male with medical history significant of medication noncompliance, hypertension, diabetes mellitus, HIV (CD4=140 on 06/06/18), CKD stage III, right BKA, who presents with polyuria, polydipsia and generalized weakness.  Patient states that he is only taking metformin pill for his DM, fbut he is supposed to take both metformin and Lantus.  He developed the symptoms including polydipsia, polyuria and generalized weakness since yesterday, which has been progressively worsening. Patient denies unilateral weakness, numbness or tingling to extremities.  No dysuria or burning on urination.  No fever or chills.  Patient does not have chest pain, shortness breath, cough.  Denies nausea vomiting, diarrhea or abdominal pain.  ED Course: pt was found to have elevated blood sugar 935, bicarbonate 20, anion gap 11, worsening renal function, WBC 6.5, pseudohyponatremia, temperature normal, no tachycardia, no tachypnea, oxygen saturation 99% on room air.  Patient is placed on stepdown for observation  Review of Systems:   General: no fevers, chills, no body weight gain, has fatigue HEENT: no blurry vision, hearing changes or sore throat Respiratory: no dyspnea, coughing, wheezing CV: no chest pain, no palpitations GI: no nausea, vomiting, abdominal pain, diarrhea, constipation GU: no dysuria, burning on urination, has increased urinary frequency, no hematuria  Ext: no leg edema Neuro: no unilateral weakness, numbness, or tingling, no vision change or hearing loss Skin: no rash, no skin tear. MSK: No muscle spasm, no deformity, no limitation of range of movement in spin  Heme: No easy bruising.  Travel history: No recent long distant travel.  Allergy:  Allergies  Allergen Reactions  . Sulfur Itching    Patient stated he's allergic to "sulfur" AND "sulfa"  . Sulfa Antibiotics Itching    Past Medical History:  Diagnosis Date  . Abscess of right foot   . AIDS ([place])   . Chronic knee pain    right  . Chronic pain   . Diabetes type 2, uncontrolled ([place])    HgA1c 17.6 (04/27/2010)  . Diabetic foot ulcer ([place]) 01/2017   right foot  . Erectile dysfunction   . Genital warts   . HIV (human immunodeficiency virus infection) ([place]) 2009   CD4 count 100, VL 13800 (05/01/2010)  . Hypertension   . Osteomyelitis ([place])    h/o hand  . Osteomyelitis of metatarsal ([place]) 04/28/2017    Past Surgical History:  Procedure Laterality Date  . AMPUTATION Right 05/02/2017   Procedure: AMPUTATION TRANSMETARSAL;  Surgeon: [name], MD;  Location: [facility];  Service: Orthopedics;  Laterality: Right;  . AMPUTATION Right 06/13/2017   Procedure: RIGHT BELOW KNEE AMPUTATION;  Surgeon: [name], MD;  Location: [facility];  Service: Orthopedics;  Laterality: Right;  . BELOW KNEE LEG AMPUTATION Right 06/13/2017  . HERNIA REPAIR    . I&D EXTREMITY Left 08/21/2014   Procedure: INCISION AND DRAINAGE LEFT SMALL FINGER;  Surgeon: Leanora Cover, MD;  Location: [facility];  Service: Orthopedics;  Laterality: Left;  . I&D EXTREMITY Right 03/18/2017   Procedure: IRRIGATION AND DEBRIDEMENT EXTREMITY;  Surgeon: [name], MD;  Location: [facility];  Service: Orthopedics;  Laterality: Right;  . MINOR IRRIGATION  AND DEBRIDEMENT OF WOUND Right 04/22/2014   Procedure: IRRIGATION AND DEBRIDEMENT OF RIGHT NECK ABCESS;  Surgeon: Jerrell Belfast, MD;  Location: [facility];  Service: ENT;  Laterality: Right;  . MULTIPLE EXTRACTIONS WITH ALVEOLOPLASTY N/A 01/18/2013   Procedure: MULTIPLE EXTRACION 3, 6, 7, 10, 11, 13, 21, 22, 27, 28, 29, 30 WITH ALVEOLOPLASTY;  Surgeon: Gae Bon, DDS;  Location: [facility];   Service: Oral Surgery;  Laterality: N/A;  . SKIN SPLIT GRAFT Right 03/21/2017   Procedure: IRRIGATION AND DEBRIDEMENT RIGHT FOOT AND APPLY SPLIT THICKNESS SKIN GRAFT AND WOUND VAC;  Surgeon: [name], MD;  Location: [facility];  Service: Orthopedics;  Laterality: Right;  . TEE WITHOUT CARDIOVERSION N/A 05/02/2017   Procedure: TRANSESOPHAGEAL ECHOCARDIOGRAM (TEE);  Surgeon: Acie Fredrickson Wonda Cheng, MD;  Location: [facility] OR;  Service: Cardiovascular;  Laterality: N/A;  coincidental to orthopedic case    Social History:  reports that he has never smoked. He has never used smokeless tobacco. He reports previous drug use. He reports that he does not drink alcohol.  Family History:  Family History  Problem Relation Age of Onset  . Hypertension Mother   . Arthritis Father   . Hypertension Father   . Hypertension Brother   . Cancer Maternal Grandmother 13       unknown type of cancer  . Depression Paternal Grandmother      Prior to Admission medications   Medication Sig Start Date End Date Taking? Authorizing Provider  amLODipine (NORVASC) 10 MG tablet Take 1 tablet (10 mg total) by mouth daily. 07/09/18   [name], FNP  amoxicillin-clavulanate (AUGMENTIN) 875-125 MG tablet Take 1 tablet by mouth every 12 (twelve) hours. 06/08/18   Patrecia Pour, MD  blood glucose meter kit and supplies Dispense based on patient and insurance preference. Use up to four times daily as directed. (FOR ICD-10 E10.9, E11.9). 07/31/18   [name], FNP  glucose blood (COOL BLOOD GLUCOSE TEST STRIPS) test strip Use as instructed 01/05/18   [name], FNP  ibuprofen (ADVIL,MOTRIN) 600 MG tablet Take 1 tablet (600 mg total) by mouth every 6 (six) hours as needed. 06/08/18   Patrecia Pour, MD  Insulin Glargine (LANTUS SOLOSTAR) 100 UNIT/ML Solostar Pen Inject 20 Units into the skin daily at 10 pm. Patient taking differently: Inject 20 Units into the skin at bedtime.  01/19/18   [name], FNP  Insulin Pen  Needle (B-D UF III MINI PEN NEEDLES) 31G X 5 MM MISC 20 Units by Does not apply route as directed. 01/19/18   [name], FNP  Insulin Syringes, Disposable, U-100 0.3 ML MISC 1 Syringe by Does not apply route 3 (three) times daily. 01/05/18   [name], FNP  Lancets [facility] ULTRASOFT) lancets Use as instructed 01/05/18   [name], FNP  metFORMIN (GLUCOPHAGE) 500 MG tablet Take 1 tablet (500 mg total) by mouth 2 (two) times daily with a meal. 06/15/18   [name], FNP  mupirocin ointment (BACTROBAN) 2 % Place 1 application into the nose 2 (two) times daily. 07/10/18   Rayburn, Neta Mends, PA-C  oxybutynin (DITROPAN) 5 MG tablet Take 1 tablet (5 mg total) by mouth 2 (two) times daily. 07/30/18   [name], FNP  PREZCOBIX 800-150 MG tablet TAKE 1 TABLET BY MOUTH DAILY. SWALLOW WHOLE. DO NOT CRUSH, BREAK OR CHEW TABLETS. TAKE WITH FOOD. 07/13/18   [name], MD  tadalafil (ADCIRCA/CIALIS) 20 MG  tablet Take 0.5-1 tablets (10-20 mg total) by mouth every other day as needed for erectile dysfunction. 11/05/17   [name], FNP  TRIUMEQ 600-50-300 MG tablet TAKE 1 TABLET BY MOUTH DAILY. 07/13/18   [name], MD    Physical Exam: Vitals:   08/07/18 0430 08/07/18 0445 08/07/18 0500 08/07/18 0545  BP: (!) 142/87 (!) 130/93 (!) 141/97 (!) 174/108  Pulse: 84 88 83 74  Resp:   16 16  Temp:      TempSrc:      SpO2: 97% 98% 99% 98%   General: Not in acute distress.  Dry mucous membrane HEENT:       Eyes: PERRL, EOMI, no scleral icterus.       ENT: No discharge from the ears and nose, no pharynx injection, no tonsillar enlargement.        Neck: No JVD, no bruit, no mass felt. Heme: No neck lymph node enlargement. Cardiac: S1/S2, RRR, No murmurs, No gallops or rubs. Respiratory: No rales, wheezing, rhonchi or rubs. GI: Soft, nondistended, nontender, no rebound pain, no organomegaly, BS present. GU: No hematuria Ext: No pitting leg edema  bilaterally. 1+DP/PT in left leg pulse. S/p of right BKA. Musculoskeletal: No joint deformities, No joint redness or warmth, no limitation of ROM in spin. Skin: No rashes.  Neuro: Alert, oriented X3, cranial nerves II-XII grossly intact, moves all extremities normally.  Psych: Patient is not psychotic, no suicidal or hemocidal ideation.  Labs on Admission: I have personally reviewed following labs and imaging studies  CBC: Recent Labs  Lab 08/07/18 0411 08/07/18 0412  WBC 6.5  --   NEUTROABS 3.9  --   HGB 11.9* 12.9*  HCT 36.1* 38.0*  MCV 81.7  --   PLT 191  --    Basic Metabolic Panel: Recent Labs  Lab 08/07/18 0411 08/07/18 0412  NA 116* 119*  K 4.8 4.7  CL 85*  --   CO2 20*  --   GLUCOSE 935*  --   BUN 34*  --   CREATININE 2.42*  --   CALCIUM 9.2  --    GFR: Estimated Creatinine Clearance: 46 mL/min (A) (by C-G formula based on SCr of 2.42 mg/dL (H)). Liver Function Tests: No results for input(s): AST, ALT, ALKPHOS, BILITOT, PROT, ALBUMIN in the last 168 hours. No results for input(s): LIPASE, AMYLASE in the last 168 hours. No results for input(s): AMMONIA in the last 168 hours. Coagulation Profile: No results for input(s): INR, PROTIME in the last 168 hours. Cardiac Enzymes: No results for input(s): CKTOTAL, CKMB, CKMBINDEX, TROPONINI in the last 168 hours. BNP (last 3 results) No results for input(s): PROBNP in the last 8760 hours. HbA1C: No results for input(s): HGBA1C in the last 72 hours. CBG: Recent Labs  Lab 08/07/18 0354 08/07/18 0609  GLUCAP >600* 590*   Lipid Profile: No results for input(s): CHOL, HDL, LDLCALC, TRIG, CHOLHDL, LDLDIRECT in the last 72 hours. Thyroid Function Tests: No results for input(s): TSH, T4TOTAL, FREET4, T3FREE, THYROIDAB in the last 72 hours. Anemia Panel: No results for input(s): VITAMINB12, FOLATE, FERRITIN, TIBC, IRON, RETICCTPCT in the last 72 hours. Urine analysis:    Component Value Date/Time   COLORURINE  COLORLESS (A) 08/07/2018 0531   APPEARANCEUR CLEAR 08/07/2018 0531   LABSPEC 1.018 08/07/2018 0531   PHURINE 6.0 08/07/2018 0531   GLUCOSEU >=500 (A) 08/07/2018 0531   HGBUR SMALL (A) 08/07/2018 [place] 08/07/2018 0531   BILIRUBINUR negative 06/15/2018  [place] 08/07/2018 0531   PROTEINUR 30 (A) 08/07/2018 0531   UROBILINOGEN 0.2 06/15/2018 1618   UROBILINOGEN 1.0 01/01/2017 1032   NITRITE NEGATIVE 08/07/2018 0531   LEUKOCYTESUR NEGATIVE 08/07/2018 0531   Sepsis Labs: '@LABRCNTIP'$ (procalcitonin:4,lacticidven:4) )No results found for this or any previous visit (from the past 240 hour(s)).   Radiological Exams on Admission: No results found.   EKG:   Not done in ED  Assessment/Plan Principal Problem:   Uncontrolled type 2 diabetes mellitus with hyperosmolar nonketotic hyperglycemia (HCC) Active Problems:   Essential hypertension   HIV disease ([place])   Acute renal failure superimposed on stage 3 chronic kidney disease (HCC)   Type II diabetes mellitus with renal manifestations ([place])   Uncontrolled type 2 diabetes mellitus with hyperosmolar nonketotic hyperglycemia ([place]): Blood sugar 935, bicarbonate 20, but anion gap is normal.  No DKA currently.  Mental status normal.  - Place in SDU for obs - IVF: 3L of NS bolus, then 125 cc/h - CBG q1h - repeat BMP at 9:00 AM - replete K as needed - Zofran prn nausea  - NPO  - consult to diabetic educator and case manager  Essential hypertension: -IV Hydralazine prn -Continue home medications: Amlodipine  HIV disease ([place]): CD4=140, and VL 32023 on 06/06/18. Pt is following up with Dr. [name]. -continue home meds -need to r/u with Dr. [name]  Acute renal failure superimposed on stage 3 chronic kidney disease [facility]): Baseline Cre is 1.3-1.5, pt's Cre is 2.41 and BUN 34 on admission. Likely due to dehydration and continuation of ibuprofen - IVF as above - Follow up renal function by BMP -Hold  ibuprofen  Depression and anxiety: no suicidal or homicidal ideations. -Continue home medications: Xanax and Lexapro  Type II diabetes mellitus with renal manifestations ([place]): Last A1c 7.8 on 06/06/18, poorly controled. Patient is supposed to take metformin and Lantus, but he is only taking metformin.  Now has [facility]HS. -on insulin gtt now   DVT ppx: SQ Lovenox Code Status: Full code Family Communication: None at bed side.   Disposition Plan:  Anticipate discharge back to previous home environment Consults called:  none Admission status: SDU/obs      Date of Service 08/07/2018    [facility]   If 7PM-7AM, please contact night-coverage [email or web address] Password [facility] 08/07/2018, 6:29 AM

## 2018-08-07 NOTE — ED Notes (Signed)
Attempted to call report

## 2018-08-07 NOTE — Progress Notes (Signed)
Inpatient Diabetes Program Recommendations  AACE/ADA: New Consensus Statement on Inpatient Glycemic Control   Target Ranges:  Prepandial:   less than 140 mg/dL      Peak postprandial:   less than 180 mg/dL (1-2 hours)      Critically ill patients:  140 - 180 mg/dL  Results for Keith [name], Keith [name] (MRN [id number]) as of 08/07/2018 11:33  Ref. Range 08/07/2018 03:54 08/07/2018 06:09 08/07/2018 07:24 08/07/2018 08:30 08/07/2018 09:43  Glucose-Capillary Latest Ref Range: 70 - 99 mg/dL >600 (HH) 590 (HH) 466 (H) 261 (H) 162 (H)   Results for Keith [name], Keith [name] (MRN [id number]) as of 08/07/2018 11:33  Ref. Range 08/07/2018 04:11  Glucose Latest Ref Range: 70 - 99 mg/dL [facility])  Results for Keith [name], Keith [name] (MRN [id number]) as of 08/07/2018 11:33  Ref. Range 01/06/2018 14:15 06/06/2018 09:38  Hemoglobin A1C Latest Ref Range: 4.8 - 5.6 % 6.4 (A) 7.8 (H)   Review of Glycemic Control  Diabetes history: DM2 Outpatient Diabetes medications: Lantus 20 units QHS, Metformin 500 mg BID Current orders for Inpatient glycemic control: IV insulin drip  Inpatient Diabetes Program Recommendations:   Insulin SQ at transition from IV insulinl: Once MD is ready to transition from IV to SQ insulin, please consider ordering Lantus 20 units Q24H, CBGs Q4H, and Novolog 0-9 units Q4H.  HgbA1C: Last A1C 7.8% on 06/06/18 indicating an average glucose of 169 mg/dl.  NOTE: Noted consult for Diabetes Coordinator. Noted in H&P that patient has only been taking Metformin and not Lantus.  Diabetes Coordinator working remotely. Have attempted to call patient's room several times but no answer. Will continue to try to contact patient.  Thanks, Barnie Alderman, RN, MSN, CDE Diabetes Coordinator Inpatient Diabetes Program [phone] (Team Pager from 8am to 5pm)

## 2018-08-07 NOTE — Progress Notes (Signed)
Inpatient Diabetes Program Recommendations  AACE/ADA: New Consensus Statement on Inpatient Glycemic Control (2015)  Target Ranges:  Prepandial:   less than 140 mg/dL      Peak postprandial:   less than 180 mg/dL (1-2 hours)      Critically ill patients:  140 - 180 mg/dL    Results for BENETT, SWOYER (MRN [id number]) as of 08/07/2018 13:16  Ref. Range 08/07/2018 03:54 08/07/2018 06:09 08/07/2018 07:24 08/07/2018 08:30 08/07/2018 09:43 08/07/2018 10:46 08/07/2018 11:40 08/07/2018 12:36  Glucose-Capillary Latest Ref Range: 70 - 99 mg/dL >600 (HH) 590 (HH) 466 (H) 261 (H) 162 (H) 156 (H) 155 (H) 162 (H)    Admit with: Polyuria, polydipsia, generalized weakness  History: DM, CKD, HIV  Home DM Meds: Metformin 500 mg BID   Current Orders: IV Insulin Drip     Lantus 30 units Daily     Novolog Moderate Correction Scale/ SSI (0-15 units) TID AC + HS       Patient seen by PCP at the [facility] on 06/15/2018.  Per note review, he told the FNP he did not want to take Insulin anymore (was taking Lantus 20 units QHS) and the FNP decided to stop his Lantus and start Metformin 500 mg BID instead.    Met with pt today to discuss the above info.  Patient verified the above info.  Discussed with pt that his A1c back in October of last year and again in February of this year showed good control on the Lantus at home.  Discussed with pt that since his glucose was so severely elevated on admission, he likely needs to take the Lantus at home to control his CBGs.  Pt stated that would be OK with him.  Has taken insulin on and off in the past.     MD- Please note that patient stopped taking his Lantus back on March 09 at his last PCP visit (per instructions from the FNP at the clinic).    Likely needs to restart Lantus at home  Was taking Lantus 20 units QHS prior to March PCP visit      --Will follow patient during hospitalization--  Wyn Quaker RN, MSN, CDE Diabetes  Coordinator Inpatient Glycemic Control Team Team Pager: [phone] (8a-5p)

## 2018-08-07 NOTE — Progress Notes (Signed)
attempted to call to get report, no answer.

## 2018-08-07 NOTE — ED Notes (Signed)
Myxredlin insulin drip started at 5.4 unit.hr., NS IV bolus infusing , EDP explained admission and plan of care to pt.

## 2018-08-07 NOTE — ED Provider Notes (Addendum)
TIME SEEN: 4:04 AM  CHIEF COMPLAINT: Hyperglycemia  HPI: Patient is a 53-year-old male with history of HIV, diabetes, hypertension, medical noncompliance who presents to the emergency department with hyperglycemia.  States that he only takes metformin 500 mg twice daily but it appears he is also supposed to be on insulin.  He states he has been out of his medications 3 days but has refills at the pharmacy to pick up.  States he plans to pick up the prescriptions tomorrow.  States he also has not had his blood pressure medication today but has this prescription at home.  He denies headache, blurry vision, numbness, tingling or focal weakness.  No chest pain or shortness of breath.  No nausea, vomiting or diarrhea.  Denies fever or cough.  States he has had increased thirst and urinary frequency.  ROS: See HPI Constitutional: no fever  Eyes: no drainage  ENT: no runny nose   Cardiovascular:  no chest pain  Resp: no SOB  GI: no vomiting GU: no dysuria Integumentary: no rash  Allergy: no hives  Musculoskeletal: no leg swelling  Neurological: no slurred speech ROS otherwise negative  PAST MEDICAL HISTORY/PAST SURGICAL HISTORY:  Past Medical History:  Diagnosis Date  . Abscess of right foot   . AIDS (HCC)   . Chronic knee pain    right  . Chronic pain   . Diabetes type 2, uncontrolled (HCC)    HgA1c 17.6 (04/27/2010)  . Diabetic foot ulcer (HCC) 01/2017   right foot  . Erectile dysfunction   . Genital warts   . HIV (human immunodeficiency virus infection) (HCC) 2009   CD4 count 100, VL 13800 (05/01/2010)  . Hypertension   . Osteomyelitis (HCC)    h/o hand  . Osteomyelitis of metatarsal (HCC) 04/28/2017    MEDICATIONS:  Prior to Admission medications   Medication Sig Start Date End Date Taking? Authorizing Provider  amLODipine (NORVASC) 10 MG tablet Take 1 tablet (10 mg total) by mouth daily. 07/09/18   Stroud, Natalie M, FNP  amoxicillin-clavulanate (AUGMENTIN) 875-125 MG tablet  Take 1 tablet by mouth every 12 (twelve) hours. 06/08/18   Grunz, Ryan B, MD  blood glucose meter kit and supplies Dispense based on patient and insurance preference. Use up to four times daily as directed. (FOR ICD-10 E10.9, E11.9). 07/31/18   Stroud, Natalie M, FNP  glucose blood (COOL BLOOD GLUCOSE TEST STRIPS) test strip Use as instructed 01/05/18   Stroud, Natalie M, FNP  ibuprofen (ADVIL,MOTRIN) 600 MG tablet Take 1 tablet (600 mg total) by mouth every 6 (six) hours as needed. 06/08/18   Grunz, Ryan B, MD  Insulin Glargine (LANTUS SOLOSTAR) 100 UNIT/ML Solostar Pen Inject 20 Units into the skin daily at 10 pm. Patient taking differently: Inject 20 Units into the skin at bedtime.  01/19/18   Stroud, Natalie M, FNP  Insulin Pen Needle (B-D UF III MINI PEN NEEDLES) 31G X 5 MM MISC 20 Units by Does not apply route as directed. 01/19/18   Stroud, Natalie M, FNP  Insulin Syringes, Disposable, U-100 0.3 ML MISC 1 Syringe by Does not apply route 3 (three) times daily. 01/05/18   Stroud, Natalie M, FNP  Lancets (ONETOUCH ULTRASOFT) lancets Use as instructed 01/05/18   Stroud, Natalie M, FNP  metFORMIN (GLUCOPHAGE) 500 MG tablet Take 1 tablet (500 mg total) by mouth 2 (two) times daily with a meal. 06/15/18   Stroud, Natalie M, FNP  mupirocin ointment (BACTROBAN) 2 % Place 1 application into the   nose 2 (two) times daily. 07/10/18   Rayburn, Shawn Montgomery, PA-C  oxybutynin (DITROPAN) 5 MG tablet Take 1 tablet (5 mg total) by mouth 2 (two) times daily. 07/30/18   Stroud, Natalie M, FNP  PREZCOBIX 800-150 MG tablet TAKE 1 TABLET BY MOUTH DAILY. SWALLOW WHOLE. DO NOT CRUSH, BREAK OR CHEW TABLETS. TAKE WITH FOOD. 07/13/18   Hatcher, Jeffrey C, MD  tadalafil (ADCIRCA/CIALIS) 20 MG tablet Take 0.5-1 tablets (10-20 mg total) by mouth every other day as needed for erectile dysfunction. 11/05/17   Stroud, Natalie M, FNP  TRIUMEQ 600-50-300 MG tablet TAKE 1 TABLET BY MOUTH DAILY. 07/13/18   Hatcher, Jeffrey C, MD    ALLERGIES:   Allergies  Allergen Reactions  . Sulfur Itching    Patient stated he's allergic to "sulfur" AND "sulfa"  . Sulfa Antibiotics Itching    SOCIAL HISTORY:  Social History   Tobacco Use  . Smoking status: Never Smoker  . Smokeless tobacco: Never Used  Substance Use Topics  . Alcohol use: No    Alcohol/week: 0.0 standard drinks    FAMILY HISTORY: Family History  Problem Relation Age of Onset  . Hypertension Mother   . Arthritis Father   . Hypertension Father   . Hypertension Brother   . Cancer Maternal Grandmother 70       unknown type of cancer  . Depression Paternal Grandmother     EXAM: BP (!) 143/108 (BP Location: Right Arm)   Pulse 90   Temp 98.6 F (37 C) (Oral)   Resp 18   SpO2 97%  CONSTITUTIONAL: Alert and oriented and responds appropriately to questions. Well-appearing; well-nourished, obese, in no distress HEAD: Normocephalic EYES: Conjunctivae clear, pupils appear equal, EOMI ENT: normal nose; moist mucous membranes NECK: Supple, no meningismus, no nuchal rigidity, no LAD  CARD: RRR; S1 and S2 appreciated; no murmurs, no clicks, no rubs, no gallops RESP: Normal chest excursion without splinting or tachypnea; breath sounds clear and equal bilaterally; no wheezes, no rhonchi, no rales, no hypoxia or respiratory distress, speaking full sentences ABD/GI: Normal bowel sounds; non-distended; soft, non-tender, no rebound, no guarding, no peritoneal signs, no hepatosplenomegaly BACK:  The back appears normal and is non-tender to palpation, there is no CVA tenderness EXT: Normal ROM in all joints; non-tender to palpation; no edema; normal capillary refill; no cyanosis, no calf tenderness or swelling    SKIN: Normal color for age and race; warm; no rash NEURO: Moves all extremities equally, normal speech, normal gait, no facial asymmetry, normal sensation diffusely PSYCH: The patient's mood and manner are appropriate. Grooming and personal hygiene are  appropriate.  MEDICAL DECISION MAKING: Patient here with complaints of hyperglycemia.  Blood sugar over 600.  He is also hypertensive here.  Complains of urinary frequency and increased thirst but no other complaints.  Patient presents frequently to the ED with similar symptoms due to medical noncompliance.  Usually not in DKA.  Will check labs, urine.  Will start IV fluids, insulin drip and give him his home amlodipine.  ED PROGRESS: Patient's labs show blood glucose of 935.  His pH on VBG is normal.  He is not in DKA.  He does have a creatinine of 2.42 with a GFR of 34.  It looks like his creatinine normally runs around 1.4 but has slowly been rising over the past couple of months and was 1.68 in February and 1.89 in March.  I have recommended admission for blood glucose control and hydration for what I suspect   is likely prerenal AKI.  He agrees to admission.  Will discuss with medicine.  Corrected sodium is 136.   5:30 AM Discussed patient's case with hospitalist, Dr. Blaine Hamper.  I have recommended admission and patient (and family if present) agree with this plan. Admitting physician will place admission orders.   I reviewed all nursing notes, vitals, pertinent previous records, EKGs, lab and urine results, imaging (as available).   CRITICAL CARE Performed by: Pryor Curia   Total critical care time: 65 minutes  Critical care time was exclusive of separately billable procedures and treating other patients.  Critical care was necessary to treat or prevent imminent or life-threatening deterioration.  Critical care was time spent personally by me on the following activities: development of treatment plan with patient and/or surrogate as well as nursing, discussions with consultants, evaluation of patient's response to treatment, examination of patient, obtaining history from patient or surrogate, ordering and performing treatments and interventions, ordering and review of laboratory studies,  ordering and review of radiographic studies, pulse oximetry and re-evaluation of patient's condition.    Chiana Wamser, Delice Bison, DO 08/07/18 0530    Valerye Kobus, Delice Bison, DO 08/07/18 0530

## 2018-08-07 NOTE — Progress Notes (Signed)
PROGRESS NOTE  Keith [name] YSA:[id number] DOB: 21-Oct-1965 DOA: 08/07/2018 PCP: [name], FNP   LOS: 0 days   Patient is from: Home  Brief Narrative / Interim history: 53 y.o. male with medical history significant of medication noncompliance, hypertension, diabetes mellitus, HIV (CD4=140 on 06/06/18), CKD stage III, right BKA, who presents with polyuria, polydipsia and generalized weakness and found to have HHS and AKI on CKD 3..  In ED, CBG 935.  Bicarb 20.  Anion gap 11.  Started on insulin drip and IV fluid and admitted to stepdown unit.  Reportedly taken off his Lantus by PCP about 2 months ago.  He is only on metformin although he has CKD 3B..  Subjective: No major events or complaints this morning.  He says he likes to eat.  CBG in 100s.  Denies chest pain, dyspnea, abdominal pain or dysuria.  He denies fever or recent illness.   Assessment & Plan: Principal Problem:   Uncontrolled type 2 diabetes mellitus with hyperosmolar nonketotic hyperglycemia (HCC) Active Problems:   Essential hypertension   HIV disease ([place])   Acute renal failure superimposed on stage 3 chronic kidney disease (HCC)   Type II diabetes mellitus with renal manifestations (HCC)  Hyperosmolar nonketotic hyperglycemia/DM-2: CBG in mid 100s.  Last A1c 7.8% on 06/06/2018.  Reportedly taken off Lantus at that time. -Transition to subcu insulin -May need to be back on insulin or other agents other than metformin given his renal function. -Check lipid panel -Start statin -Carb modified diet -Recheck A1c. -Discontinue insulin drip 1 hour after subcu Lantus. -Transfer to [facility].  AKI on CKD-3B- likely due to osmotic diuretics from hyperglycemia.  Improving. -Continue IV fluid  HIV (CD4=140 on 06/06/18)-followed by Dr. [name].  On Prezcobix and Triumeq at home -Continue home meds. -FYI ID.  Hypertension: Normotensive. -Continue home meds.  Hyponatremia: initial sodium 116 and corrects to 132 for  hyperglycemia -Continue normal saline.  Possible to send to the PCP so for the first time if he gave the first 1 is okay with him continuing them that can be done once a week at least for 1 months  Right BKA wound: 2 small circular wounds over right BKA stump, about 1.5 cm in diameter.  No signs of infection. Wound-consult.  Scheduled Meds: . abacavir-dolutegravir-lamiVUDine  1 tablet Oral Daily  . ALPRAZolam  0.5 mg Oral TID  . amLODipine  10 mg Oral Daily  . darunavir-cobicistat  1 tablet Oral Q breakfast  . enoxaparin (LOVENOX) injection  60 mg Subcutaneous Q24H  . escitalopram  20 mg Oral Daily  . insulin aspart  0-15 Units Subcutaneous TID WC  . insulin aspart  0-5 Units Subcutaneous QHS  . insulin glargine  30 Units Subcutaneous Daily  . mupirocin ointment  1 application Nasal BID  . oxybutynin  5 mg Oral BID  . oxyCODONE  10 mg Oral QID   Continuous Infusions: . sodium chloride 125 mL/hr at 08/07/18 1242  . insulin 1.9 mL/hr at 08/07/18 1051   PRN Meds:.acetaminophen **OR** acetaminophen, hydrALAZINE, ondansetron **OR** ondansetron (ZOFRAN) IV   DVT prophylaxis: Subcu heparin Code Status: Full code Family Communication: None at bedside Disposition Plan: Remains inpatient, observation status.  Admitted early this morning.  Anticipate discharge home tomorrow.  Consultants:   Diabetic monitor  Procedures:   None  Microbiology: . None  Antimicrobials:  None  Objective: Vitals:   08/07/18 0545 08/07/18 0726 08/07/18 0837 08/07/18 0949  BP: (!) 174/108 (!) 153/101 92/71 115/87  Pulse: 74 84 72 70  Resp: 16 16  19   Temp:    98.1 F (36.7 C)  TempSrc:    Oral  SpO2: 98% 99% 96% 99%  Weight:    120.5 kg  Height:    5\' 10"  (1.778 m)    Intake/Output Summary (Last 24 hours) at 08/07/2018 1423 Last data filed at 08/07/2018 1051 Gross per 24 hour  Intake 783.5 ml  Output 1600 ml  Net -816.5 ml   Filed Weights   08/07/18 0949  Weight: 120.5 kg     Examination:  GENERAL: Sleepy but arises easily and responds to question appropriately. HEENT: MMM.  Vision and hearing grossly intact.  NECK: Supple.  No JVD.  LUNGS:  No IWOB. Good air movement bilaterally. HEART:  RRR. Heart sounds normal.  ABD: Bowel sounds present. Soft. Non tender.  MSK/EXT:  Moves all extremities.  Right BKA SKIN: 2 small circular wounds over right BKA stump, about 1.5 cm in diameter.  No signs of infection. NEURO: Sleepy but arouses easily responds to question appropriately.  No gross neuro deficit. PSYCH: Calm. Normal affect.   Data Reviewed: I have independently reviewed following labs and imaging studies   CBC: Recent Labs  Lab 08/07/18 0411 08/07/18 0412  WBC 6.5  --   NEUTROABS 3.9  --   HGB 11.9* 12.9*  HCT 36.1* 38.0*  MCV 81.7  --   PLT 191  --    Basic Metabolic Panel: Recent Labs  Lab 08/07/18 0411 08/07/18 0412 08/07/18 0929  NA 116* 119* 132*  K 4.8 4.7 3.5  CL 85*  --  99  CO2 20*  --  22  GLUCOSE 935*  --  155*  BUN 34*  --  29*  CREATININE 2.42*  --  2.15*  CALCIUM 9.2  --  9.1   GFR: Estimated Creatinine Clearance: 51.7 mL/min (A) (by C-G formula based on SCr of 2.15 mg/dL (H)). Liver Function Tests: No results for input(s): AST, ALT, ALKPHOS, BILITOT, PROT, ALBUMIN in the last 168 hours. No results for input(s): LIPASE, AMYLASE in the last 168 hours. No results for input(s): AMMONIA in the last 168 hours. Coagulation Profile: No results for input(s): INR, PROTIME in the last 168 hours. Cardiac Enzymes: No results for input(s): CKTOTAL, CKMB, CKMBINDEX, TROPONINI in the last 168 hours. BNP (last 3 results) No results for input(s): PROBNP in the last 8760 hours. HbA1C: No results for input(s): HGBA1C in the last 72 hours. CBG: Recent Labs  Lab 08/07/18 0943 08/07/18 1046 08/07/18 1140 08/07/18 1236 08/07/18 1338  GLUCAP 162* 156* 155* 162* 178*   Lipid Profile: No results for input(s): CHOL, HDL, LDLCALC,  TRIG, CHOLHDL, LDLDIRECT in the last 72 hours. Thyroid Function Tests: No results for input(s): TSH, T4TOTAL, FREET4, T3FREE, THYROIDAB in the last 72 hours. Anemia Panel: No results for input(s): VITAMINB12, FOLATE, FERRITIN, TIBC, IRON, RETICCTPCT in the last 72 hours. Urine analysis:    Component Value Date/Time   COLORURINE COLORLESS (A) 08/07/2018 0531   APPEARANCEUR CLEAR 08/07/2018 0531   LABSPEC 1.018 08/07/2018 0531   PHURINE 6.0 08/07/2018 0531   GLUCOSEU >=500 (A) 08/07/2018 0531   HGBUR SMALL (A) 08/07/2018 0531   BILIRUBINUR NEGATIVE 08/07/2018 0531   BILIRUBINUR negative 06/15/2018 1618   KETONESUR NEGATIVE 08/07/2018 0531   PROTEINUR 30 (A) 08/07/2018 0531   UROBILINOGEN 0.2 06/15/2018 1618   UROBILINOGEN 1.0 01/01/2017 1032   NITRITE NEGATIVE 08/07/2018 [place] 08/07/2018 0531  Sepsis Labs: Invalid input(s): PROCALCITONIN, LACTICIDVEN  Recent Results (from the past 240 hour(s))  MRSA PCR Screening     Status: Abnormal   Collection Time: 08/07/18 10:20 AM  Result Value Ref Range Status   MRSA by PCR POSITIVE (A) NEGATIVE Final    Comment:        The GeneXpert MRSA Assay (FDA approved for NASAL specimens only), is one component of a comprehensive MRSA colonization surveillance program. It is not intended to diagnose MRSA infection nor to guide or monitor treatment for MRSA infections. CRITICAL RESULT CALLED TO, READ BACK BY AND VERIFIED WITH: WHITE K AT 1312 ON 08/07/2018 BY SAINVILUS S Performed at [facility], [place] [address] [place], [place] [address]       Radiology Studies: No results found.   Chelbi Herber T. [facility] Pager [phone]  If 7PM-7AM, please contact night-coverage [email or web address] Password [email or web address] 08/07/2018, 2:23 PM

## 2018-08-07 NOTE — Consult Note (Signed)
[facility] Nurse wound consult note Patient receiving care in [facility] [phone]. Reason for Consult: Right BKA wound Wound type: appears related to rubbing/pressure from the prosthesis.  Patient states they have been there for "a couple of months" and all he has been doing is "keeping them clean". Pressure Injury POA: Yes Measurements: The largest wound is medial and on the patella.  It measures 1 cm x 1.3 cm is superficial (approx 0.1 cm depth) and 100% pink.  The lateral wound is re-epithelializing without measurable depth and is 1 cm x 0.7 cm.  It is pink.  Neither wound has odor or drainage. Periwound: intact Dressing procedure/placement/frequency: Cleanse area with soap and water. Place a small foam dressing Kellie Simmering [id number]) over the area. Change every 3 days. Next change date is 5/4. Monitor the wound area(s) for worsening of condition such as: Signs/symptoms of infection,  Increase in size,  Development of or worsening of odor, Development of pain, or increased pain at the affected locations.  Notify the medical team if any of these develop.  Thank you for the consult.  Discussed plan of care with the patient and bedside nurse.  [place] nurse will not follow at this time.  Please re-consult the [facility] team if needed.  Val Riles, RN, MSN, CWOCN, CNS-BC, pager [phone]

## 2018-08-07 NOTE — ED Triage Notes (Signed)
Patient reports urinary frequency for several days he suspects hyperglycemia , denies fever or chills .

## 2018-08-07 NOTE — ED Notes (Signed)
ED TO INPATIENT HANDOFF REPORT  ED Nurse Name and Phone #: [phone] Aletha Halim Name/Age/Gender Keith [name] 53 y.o. male Room/Bed: 016C/016C  Code Status   Code Status: Full Code  Home/SNF/Other Home Patient oriented to: self, place, time and situation Is this baseline? Yes   Triage Complete: Triage complete  Chief Complaint sugar high  Triage Note Patient reports urinary frequency for several days he suspects hyperglycemia , denies fever or chills .    Allergies Allergies  Allergen Reactions  . Sulfur Itching    Patient stated he's allergic to "sulfur" AND "sulfa"  . Sulfa Antibiotics Itching    Level of Care/Admitting Diagnosis ED Disposition    ED Disposition Condition [name] Area: [place] [[id number]]  Level of Care: Progressive [102]  I expect the patient will be discharged within 24 hours: No (not a candidate for 5C-Observation unit)  Covid Evaluation: N/A  Diagnosis: Uncontrolled type 2 diabetes mellitus with hyperosmolar nonketotic hyperglycemia [facility]) [007121]  Admitting Physician: Ivor Costa [[id number]]  Attending Physician: Ivor Costa [[id number]]  PT Class (Do Not Modify): Observation [104]  PT Acc Code (Do Not Modify): Observation [10022]       B Medical/Surgery History Past Medical History:  Diagnosis Date  . Abscess of right foot   . AIDS ([place])   . Chronic knee pain    right  . Chronic pain   . Diabetes type 2, uncontrolled ([place])    HgA1c 17.6 (04/27/2010)  . Diabetic foot ulcer ([place]) 01/2017   right foot  . Erectile dysfunction   . Genital warts   . HIV (human immunodeficiency virus infection) ([place]) 2009   CD4 count 100, VL 13800 (05/01/2010)  . Hypertension   . Osteomyelitis ([place])    h/o hand  . Osteomyelitis of metatarsal ([place]) 04/28/2017   Past Surgical History:  Procedure Laterality Date  . AMPUTATION Right 05/02/2017   Procedure: AMPUTATION TRANSMETARSAL;  Surgeon: [name], MD;  Location: [place];  Service: Orthopedics;  Laterality: Right;  . AMPUTATION Right 06/13/2017   Procedure: RIGHT BELOW KNEE AMPUTATION;  Surgeon: [name], MD;  Location: [place];  Service: Orthopedics;  Laterality: Right;  . BELOW KNEE LEG AMPUTATION Right 06/13/2017  . HERNIA REPAIR    . I&D EXTREMITY Left 08/21/2014   Procedure: INCISION AND DRAINAGE LEFT SMALL FINGER;  Surgeon: Leanora Cover, MD;  Location: [place];  Service: Orthopedics;  Laterality: Left;  . I&D EXTREMITY Right 03/18/2017   Procedure: IRRIGATION AND DEBRIDEMENT EXTREMITY;  Surgeon: [name], MD;  Location: [place];  Service: Orthopedics;  Laterality: Right;  . MINOR IRRIGATION AND DEBRIDEMENT OF WOUND Right 04/22/2014   Procedure: IRRIGATION AND DEBRIDEMENT OF RIGHT NECK ABCESS;  Surgeon: Jerrell Belfast, MD;  Location: [place];  Service: ENT;  Laterality: Right;  . MULTIPLE EXTRACTIONS WITH ALVEOLOPLASTY N/A 01/18/2013   Procedure: MULTIPLE EXTRACION 3, 6, 7, 10, 11, 13, 21, 22, 27, 28, 29, 30 WITH ALVEOLOPLASTY;  Surgeon: Gae Bon, DDS;  Location: [place];  Service: Oral Surgery;  Laterality: N/A;  . SKIN SPLIT GRAFT Right 03/21/2017   Procedure: IRRIGATION AND DEBRIDEMENT RIGHT FOOT AND APPLY SPLIT THICKNESS SKIN GRAFT AND WOUND VAC;  Surgeon: [name], MD;  Location: [place];  Service: Orthopedics;  Laterality: Right;  . TEE WITHOUT CARDIOVERSION N/A 05/02/2017   Procedure: TRANSESOPHAGEAL ECHOCARDIOGRAM (TEE);  Surgeon: Acie Fredrickson Wonda Cheng, MD;  Location: [place];  Service: Cardiovascular;  Laterality:  N/A;  coincidental to orthopedic case     A IV Location/Drains/Wounds Patient Lines/Drains/Airways Status   Active Line/Drains/Airways    Name:   Placement date:   Placement time:   Site:   Days:   Peripheral IV 08/07/18 Left Antecubital   08/07/18    0449    Antecubital   less than 1   Incision (Closed) 03/18/17 Foot   03/18/17    1030     507   Incision (Closed) 03/21/17 Foot Right   03/21/17    1026     504   Incision (Closed)  Foot Anterior;Right   -    -        Incision (Closed) 05/02/17 Foot Right   05/02/17    1311     462   Incision (Closed) 06/13/17 Leg Right   06/13/17    1527     420   Wound / Incision (Open or Dehisced) 04/29/17 Diabetic ulcer Foot Right purlent drainage   04/29/17    1102    Foot   465   Wound / Incision (Open or Dehisced) 04/29/17 Diabetic ulcer Heel Right   04/29/17    1100    Heel   465          Intake/Output Last 24 hours No intake or output data in the 24 hours ending 08/07/18 0620  Labs/Imaging Results for orders placed or performed during the hospital encounter of 08/07/18 (from the past 48 hour(s))  CBG monitoring, ED     Status: Abnormal   Collection Time: 08/07/18  3:54 AM  Result Value Ref Range   Glucose-Capillary >600 (HH) 70 - 99 mg/dL  CBC with Differential     Status: Abnormal   Collection Time: 08/07/18  4:11 AM  Result Value Ref Range   WBC 6.5 4.0 - 10.5 K/uL   RBC 4.42 4.22 - 5.81 MIL/uL   Hemoglobin 11.9 (L) 13.0 - 17.0 g/dL   HCT 36.1 (L) 39.0 - 52.0 %   MCV 81.7 80.0 - 100.0 fL   MCH 26.9 26.0 - 34.0 pg   MCHC 33.0 30.0 - 36.0 g/dL   RDW 13.0 11.5 - 15.5 %   Platelets 191 150 - 400 K/uL   nRBC 0.0 0.0 - 0.2 %   Neutrophils Relative % 61 %   Neutro Abs 3.9 1.7 - 7.7 K/uL   Lymphocytes Relative 28 %   Lymphs Abs 1.8 0.7 - 4.0 K/uL   Monocytes Relative 7 %   Monocytes Absolute 0.5 0.1 - 1.0 K/uL   Eosinophils Relative 4 %   Eosinophils Absolute 0.2 0.0 - 0.5 K/uL   Basophils Relative 0 %   Basophils Absolute 0.0 0.0 - 0.1 K/uL   Immature Granulocytes 0 %   Abs Immature Granulocytes 0.01 0.00 - 0.07 K/uL    Comment: Performed at [facility], [address] [place], [place] [address]  Basic metabolic panel     Status: Abnormal   Collection Time: 08/07/18  4:11 AM  Result Value Ref Range   Sodium 116 (LL) 135 - 145 mmol/L    Comment: CRITICAL RESULT CALLED TO, READ BACK BY AND VERIFIED WITH: SANGALANG,R RN 08/07/2018 0453 JORDANS     Potassium 4.8 3.5 - 5.1 mmol/L   Chloride 85 (L) 98 - 111 mmol/L   CO2 20 (L) 22 - 32 mmol/L   Glucose, Bld 935 (HH) 70 - 99 mg/dL    Comment: CRITICAL RESULT CALLED TO, READ BACK  BY AND VERIFIED WITH: SANGALANG,R RN 08/07/2018 0453 JORDANS    BUN 34 (H) 6 - 20 mg/dL   Creatinine, Ser 2.42 (H) 0.61 - 1.24 mg/dL   Calcium 9.2 8.9 - 10.3 mg/dL   GFR calc non Af Amer 29 (L) >60 mL/min   GFR calc Af Amer 34 (L) >60 mL/min   Anion gap 11 5 - 15    Comment: Performed at [facility] [address] , [place] [address]  POCT I-Stat EG7     Status: Abnormal   Collection Time: 08/07/18  4:12 AM  Result Value Ref Range   pH, Ven 7.338 7.250 - 7.430   pCO2, Ven 41.4 (L) 44.0 - 60.0 mmHg   pO2, Ven 30.0 (LL) 32.0 - 45.0 mmHg   Bicarbonate 22.3 20.0 - 28.0 mmol/L   TCO2 24 22 - 32 mmol/L   O2 Saturation 52.0 %   Acid-base deficit 3.0 (H) 0.0 - 2.0 mmol/L   Sodium 119 (LL) 135 - 145 mmol/L   Potassium 4.7 3.5 - 5.1 mmol/L   Calcium, Ion 1.17 1.15 - 1.40 mmol/L   HCT 38.0 (L) 39.0 - 52.0 %   Hemoglobin 12.9 (L) 13.0 - 17.0 g/dL   Patient temperature 37.0 C    Sample type VENOUS    Comment NOTIFIED PHYSICIAN   Urinalysis, Routine w reflex microscopic     Status: Abnormal   Collection Time: 08/07/18  5:31 AM  Result Value Ref Range   Color, Urine COLORLESS (A) YELLOW   APPearance CLEAR CLEAR   Specific Gravity, Urine 1.018 1.005 - 1.030   pH 6.0 5.0 - 8.0   Glucose, UA >=500 (A) NEGATIVE mg/dL   Hgb urine dipstick SMALL (A) NEGATIVE   Bilirubin Urine NEGATIVE NEGATIVE   Ketones, ur NEGATIVE NEGATIVE mg/dL   Protein, ur 30 (A) NEGATIVE mg/dL   Nitrite NEGATIVE NEGATIVE   Leukocytes,Ua NEGATIVE NEGATIVE   RBC / HPF 0-5 0 - 5 RBC/hpf   WBC, UA 0-5 0 - 5 WBC/hpf   Bacteria, UA NONE SEEN NONE SEEN    Comment: Performed at [facility], [place] [address] [place], [place] [address]  CBG monitoring, ED     Status: Abnormal   Collection Time: 08/07/18  6:09 AM  Result  Value Ref Range   Glucose-Capillary 590 (HH) 70 - 99 mg/dL   Comment 1 Notify RN    No results found.  Pending Labs Unresulted Labs (From admission, onward)    Start     Ordered   08/08/18 0500  CBC  Tomorrow morning,   R     08/07/18 0604   08/07/18 3149  Basic metabolic panel  Once,   R     08/07/18 0604          Vitals/Pain Today's Vitals   08/07/18 0445 08/07/18 0500 08/07/18 0504 08/07/18 0545  BP: (!) 130/93 (!) 141/97  (!) 174/108  Pulse: 88 83  74  Resp:  16  16  Temp:      TempSrc:      SpO2: 98% 99%  98%  PainSc:  0-No pain 0-No pain Asleep    Isolation Precautions No active isolations  Medications Medications  insulin regular, human (MYXREDLIN) 100 units/ 100 mL infusion (10.6 Units/hr Intravenous Rate/Dose Change 08/07/18 0612)  0.9 %  sodium chloride infusion ( Intravenous New Bag/Given 08/07/18 0451)  oxyCODONE (Oxy IR/ROXICODONE) immediate release tablet 10 mg (has no administration in time range)  darunavir-cobicistat (PREZCOBIX)  800-150 MG per tablet 1 tablet (has no administration in time range)  abacavir-dolutegravir-lamiVUDine (TRIUMEQ) 600-50-300 MG per tablet 1 tablet (has no administration in time range)  amLODipine (NORVASC) tablet 10 mg (has no administration in time range)  ALPRAZolam (XANAX) tablet 0.5 mg (has no administration in time range)  escitalopram (LEXAPRO) tablet 20 mg (has no administration in time range)  oxybutynin (DITROPAN) tablet 5 mg (has no administration in time range)  mupirocin ointment (BACTROBAN) 2 % 1 application (has no administration in time range)  enoxaparin (LOVENOX) injection 40 mg (has no administration in time range)  acetaminophen (TYLENOL) tablet 650 mg (has no administration in time range)    Or  acetaminophen (TYLENOL) suppository 650 mg (has no administration in time range)  ondansetron (ZOFRAN) tablet 4 mg (has no administration in time range)    Or  ondansetron (ZOFRAN) injection 4 mg (has no  administration in time range)  hydrALAZINE (APRESOLINE) injection 5 mg (has no administration in time range)  sodium chloride 0.9 % bolus 1,000 mL (0 mLs Intravenous Stopped 08/07/18 0548)  sodium chloride 0.9 % bolus 1,000 mL (0 mLs Intravenous Stopped 08/07/18 0548)  amLODipine (NORVASC) tablet 10 mg (10 mg Oral Given 08/07/18 0441)  sodium chloride 0.9 % bolus 1,000 mL (1,000 mLs Intravenous New Bag/Given 08/07/18 0540)    Mobility walks with person assist Low fall risk   Focused Assessments Renal   R Recommendations: See Admitting Provider Note  Report given to:   Additional Notes:  N/A

## 2018-08-08 DIAGNOSIS — E1100 Type 2 diabetes mellitus with hyperosmolarity without nonketotic hyperglycemic-hyperosmolar coma (NKHHC): Secondary | ICD-10-CM | POA: Diagnosis not present

## 2018-08-08 LAB — GLUCOSE, CAPILLARY: Glucose-Capillary: 158 mg/dL — ABNORMAL HIGH (ref 70–99)

## 2018-08-08 LAB — BASIC METABOLIC PANEL
Anion gap: 9 (ref 5–15)
BUN: 26 mg/dL — ABNORMAL HIGH (ref 6–20)
CO2: 20 mmol/L — ABNORMAL LOW (ref 22–32)
Calcium: 9.1 mg/dL (ref 8.9–10.3)
Chloride: 106 mmol/L (ref 98–111)
Creatinine, Ser: 1.94 mg/dL — ABNORMAL HIGH (ref 0.61–1.24)
GFR calc Af Amer: 44 mL/min — ABNORMAL LOW (ref >60)
GFR calc non Af Amer: 38 mL/min — ABNORMAL LOW (ref >60)
Glucose, Bld: 165 mg/dL — ABNORMAL HIGH (ref 70–99)
Potassium: 4.2 mmol/L (ref 3.5–5.1)
Sodium: 135 mmol/L (ref 135–145)

## 2018-08-08 LAB — MAGNESIUM: Magnesium: 2.2 mg/dL (ref 1.7–2.4)

## 2018-08-08 LAB — PHOSPHORUS: Phosphorus: 3.4 mg/dL (ref 2.5–4.6)

## 2018-08-08 MED ORDER — INSULIN GLARGINE 100 UNIT/ML SOLOSTAR PEN: 20.0000 [IU] | PEN_INJECTOR | Freq: Two times a day (BID) | SUBCUTANEOUS | 0 refills | Status: DC

## 2018-08-08 MED ORDER — ACETAMINOPHEN 325 MG PO TABS: 650.0000 mg | ORAL_TABLET | Freq: Four times a day (QID) | ORAL | 2 refills | Status: DC | PRN

## 2018-08-08 NOTE — Discharge Planning (Signed)
Nsg Discharge Note  Admit Date:  08/07/2018 Discharge date: 08/08/2018   [name] to be D/C'd Home per MD order.     Discharge Medication: Allergies as of 08/08/2018      Reactions   Sulfur Itching   Patient stated he's allergic to "sulfur" AND "sulfa"   Sulfa Antibiotics Itching      Medication List    STOP taking these medications   amoxicillin-clavulanate 875-125 MG tablet Commonly known as:  AUGMENTIN   ibuprofen 600 MG tablet Commonly known as:  ADVIL     TAKE these medications   acetaminophen 325 MG tablet Commonly known as:  Tylenol Take 2 tablets (650 mg total) by mouth every 6 (six) hours as needed.   ALPRAZolam 0.5 MG tablet Commonly known as:  XANAX Take 0.5 mg by mouth 3 (three) times daily.   amLODipine 10 MG tablet Commonly known as:  NORVASC Take 1 tablet (10 mg total) by mouth daily.   blood glucose meter kit and supplies Dispense based on patient and insurance preference. Use up to four times daily as directed. (FOR ICD-10 E10.9, E11.9).   escitalopram 20 MG tablet Commonly known as:  LEXAPRO Take 20 mg by mouth daily.   glucose blood test strip Commonly known as:  Cool Blood Glucose Test Strips Use as instructed   Insulin Glargine 100 UNIT/ML Solostar Pen Commonly known as:  Lantus SoloStar Inject 20 Units into the skin 2 (two) times daily. What changed:  when to take this   Insulin Pen Needle 31G X 5 MM Misc Commonly known as:  B-D UF III MINI PEN NEEDLES 20 Units by Does not apply route as directed.   Insulin Syringes (Disposable) U-100 0.3 ML Misc 1 Syringe by Does not apply route 3 (three) times daily.   metFORMIN 500 MG tablet Commonly known as:  GLUCOPHAGE Take 1 tablet (500 mg total) by mouth 2 (two) times daily with a meal.   mupirocin ointment 2 % Commonly known as:  Bactroban Place 1 application into the nose 2 (two) times daily.   onetouch ultrasoft lancets Use as instructed   oxybutynin 5 MG tablet Commonly known  as:  DITROPAN Take 1 tablet (5 mg total) by mouth 2 (two) times daily.   Oxycodone HCl 10 MG Tabs Take 10 mg by mouth 4 (four) times daily.   Prezcobix 800-150 MG tablet Generic drug:  darunavir-cobicistat TAKE 1 TABLET BY MOUTH DAILY. SWALLOW WHOLE. DO NOT CRUSH, BREAK OR CHEW TABLETS. TAKE WITH FOOD. What changed:  See the new instructions.   tadalafil 20 MG tablet Commonly known as:  ADCIRCA/CIALIS Take 0.5-1 tablets (10-20 mg total) by mouth every other day as needed for erectile dysfunction.   Triumeq 600-50-300 MG tablet Generic drug:  abacavir-dolutegravir-lamiVUDine TAKE 1 TABLET BY MOUTH DAILY.       Discharge Assessment: Vitals:   08/08/18 0424 08/08/18 0815  BP: 104/64 (!) 134/97  Pulse: 69 77  Resp: 16   Temp: 97.8 F (36.6 C)   SpO2: 98% 100%   Skin clean, dry and intact without evidence of skin break down, no evidence of skin tears noted. IV catheter discontinued intact. Site without signs and symptoms of complications - no redness or edema noted at insertion site, patient denies c/o pain - only slight tenderness at site.  Dressing with slight pressure applied.  D/c Instructions-Education: Discharge instructions given to patient/family with verbalized understanding. D/c education completed with patient/family including follow up instructions, medication list, d/c activities limitations  if indicated, with other d/c instructions as indicated by MD - patient able to verbalize understanding, all questions fully answered. Patient instructed to return to ED, call 911, or call MD for any changes in condition.  Patient escorted via [place], and D/C home via private auto.  Hiram Comber, RN 08/08/2018 11:20 AM

## 2018-08-08 NOTE — Care Management Obs Status (Signed)
[place] NOTIFICATION   Patient Details  Name: Keith [name] MRN: [id number] Date of Birth: [birth year]   Medicare Observation Status Notification Given:  Yes    Carles Collet, RN 08/08/2018, 10:44 AM

## 2018-08-08 NOTE — Discharge Summary (Signed)
Physician Discharge Summary  Keith [name] JZP:[id number] DOB: 1965/07/27 DOA: 08/07/2018  PCP: [name], FNP  Admit date: 08/07/2018 Discharge date: 08/08/2018  Admitted From: Home Disposition: Home  Recommendations for Outpatient Follow-up:  1. Follow up with PCP in 1-2 weeks 2. Adjust insulin dose and other diabetic medication as appropriate 3. Check BMP at follow-up  4. Ensure patient has follow-up with ID 5. Please follow up on the following pending results: None  Home Health: None Equipment/Devices: None  Discharge Condition: Stable CODE STATUS: Full code  Hospital Course: 53 y.o.malewith medical history significant ofmedication noncompliance, hypertension, diabetes mellitus, HIV(CD4=140 on 06/06/18), CKD stage III, right BKA, who presents with polyuria, polydipsia and generalized weakness and found to have HHS and AKI on CKD 3..  In ED, CBG 935.  Bicarb 20.  Anion gap 11.  Started on insulin drip and IV fluid and admitted to stepdown unit.  Reportedly taken off his Lantus by PCP about 2 months ago.  His A1c was 7.8%. He is only on metformin although he has CKD 3B.  Patient's hyperglycemia resolved.  He was transitioned to subcu insulin the next day.  Required a total of 45 units in 24 hours for appropriate blood glucose control.  Discharged on Lantus 20 units twice daily in addition to his home metformin.  Adjust medications as appropriate at follow-up.  Recommend initiating statin at follow-up unless contraindication.  Renal function improved.  Recheck BMP at follow-up.  No significant proteinuria on UA.  Discharge Diagnoses:  Principal Problem:   Uncontrolled type 2 diabetes mellitus with hyperosmolar nonketotic hyperglycemia (HCC) Active Problems:   Essential hypertension   HIV disease ([place])   Acute renal failure superimposed on stage 3 chronic kidney disease (HCC)   Type II diabetes mellitus with renal manifestations (HCC)  Hyperosmolar nonketotic  hyperglycemia/DM-2: CBG in mid 100s.  Last A1c 7.8% on 06/06/2018.  Reportedly taken off Lantus at that time.  A1c 15.0% now.  Required 45 units in the last 24-hour -Discharged on 20 units twice daily and his home metformin. -Adjust insulin dose as appropriate at follow-up. -Recommend starting statin unless contraindication.  AKI on CKD-3B- likely due to osmotic diuretics from hyperglycemia.  Also on ibuprofen at home.  Ibuprofen discontinued.  Renal function improved -Recheck BMP at follow-up.  HIV(CD4=140 on 06/06/18)-followed by Dr. [name].   -Continue home Prezcobix and Triumeq -Ensure ID follow-up  Hypertension: Normotensive. -Continue home meds.  Hyponatremia: initial sodium 116 and corrects to 132 for hyperglycemia.  Resolved.  Right BKA wound: 2 small circular wounds over right BKA stump, about 1.5 cm in diameter.  No signs of infection. -Continue wound care  Discharge Instructions  Discharge Instructions    Diet - low sodium heart healthy   Complete by:  As directed    Discharge instructions   Complete by:  As directed    It has been a pleasure taking care of you! You were admitted with severely elevated blood glucose.  A1c is elevated to 15%.  We restarted you on Lantus insulin at 20 units twice a day.  Continue metformin.  Check your blood glucose in the morning and in the evening before meals write them down. Follow-up with your primary care doctor in 1 to 2 weeks.  Take your blood glucose log to your primary care doctor at follow-up.  Follow with infectious disease doctor, Dr. [name] in 2 to 3 weeks.  Have the glucose tablets or an orange juice in case your blood glucose is  less than 70.  Avoid over-the-counter pain medications other than Tylenol as they could damage your kidney.  Once you are discharged, your primary care physician will handle any further medical issues. Please note that NO REFILLS for any discharge medications will be authorized once you are  discharged, as it is imperative that you return to your primary care physician (or establish a relationship with a primary care physician if you do not have one) for your aftercare needs so that they can reassess your need for medications and monitor your lab values. Take care,   Increase activity slowly   Complete by:  As directed      Allergies as of 08/08/2018      Reactions   Sulfur Itching   Patient stated he's allergic to "sulfur" AND "sulfa"   Sulfa Antibiotics Itching      Medication List    STOP taking these medications   amoxicillin-clavulanate 875-125 MG tablet Commonly known as:  AUGMENTIN   ibuprofen 600 MG tablet Commonly known as:  ADVIL     TAKE these medications   acetaminophen 325 MG tablet Commonly known as:  Tylenol Take 2 tablets (650 mg total) by mouth every 6 (six) hours as needed.   ALPRAZolam 0.5 MG tablet Commonly known as:  XANAX Take 0.5 mg by mouth 3 (three) times daily.   amLODipine 10 MG tablet Commonly known as:  NORVASC Take 1 tablet (10 mg total) by mouth daily.   blood glucose meter kit and supplies Dispense based on patient and insurance preference. Use up to four times daily as directed. (FOR ICD-10 E10.9, E11.9).   escitalopram 20 MG tablet Commonly known as:  LEXAPRO Take 20 mg by mouth daily.   glucose blood test strip Commonly known as:  Cool Blood Glucose Test Strips Use as instructed   Insulin Glargine 100 UNIT/ML Solostar Pen Commonly known as:  Lantus SoloStar Inject 20 Units into the skin 2 (two) times daily. What changed:  when to take this   Insulin Pen Needle 31G X 5 MM Misc Commonly known as:  B-D UF III MINI PEN NEEDLES 20 Units by Does not apply route as directed.   Insulin Syringes (Disposable) U-100 0.3 ML Misc 1 Syringe by Does not apply route 3 (three) times daily.   metFORMIN 500 MG tablet Commonly known as:  GLUCOPHAGE Take 1 tablet (500 mg total) by mouth 2 (two) times daily with a meal.    mupirocin ointment 2 % Commonly known as:  Bactroban Place 1 application into the nose 2 (two) times daily.   onetouch ultrasoft lancets Use as instructed   oxybutynin 5 MG tablet Commonly known as:  DITROPAN Take 1 tablet (5 mg total) by mouth 2 (two) times daily.   Oxycodone HCl 10 MG Tabs Take 10 mg by mouth 4 (four) times daily.   Prezcobix 800-150 MG tablet Generic drug:  darunavir-cobicistat TAKE 1 TABLET BY MOUTH DAILY. SWALLOW WHOLE. DO NOT CRUSH, BREAK OR CHEW TABLETS. TAKE WITH FOOD. What changed:  See the new instructions.   tadalafil 20 MG tablet Commonly known as:  ADCIRCA/CIALIS Take 0.5-1 tablets (10-20 mg total) by mouth every other day as needed for erectile dysfunction.   Triumeq 600-50-300 MG tablet Generic drug:  abacavir-dolutegravir-lamiVUDine TAKE 1 TABLET BY MOUTH DAILY.      Follow-up Information    [name], FNP. Schedule an appointment as soon as possible for a visit in 1 week(s).   Specialty:  Family Medicine Contact information:  [place] [address] [phone]        [name], MD. Schedule an appointment as soon as possible for a visit in 3 week(s).   Specialty:  Infectious Diseases Contact information: [place] [address] [phone]           Consultations:  None  Procedures/Studies:  2D Echo: None   No results found.   Subjective: No major events overnight of this morning.  Hypoglycemia resolved.  No complaint this morning.  Ready to go home.  Discharge Exam: Vitals:   08/08/18 0424 08/08/18 0815  BP: 104/64 (!) 134/97  Pulse: 69 77  Resp: 16   Temp: 97.8 F (36.6 C)   SpO2: 98% 100%    GENERAL: No acute distress.  Appears well.  HEENT: MMM.  Vision and hearing grossly intact.  NECK: Supple.  No JVD.  LUNGS:  No IWOB. Good air movement bilaterally. HEART:  RRR. Heart sounds normal.  ABD: Bowel sounds present. Soft. Non tender.   MSK/EXT:  Moves all extremities.  Right BKA. SKIN: 2 small circular wounds over right BKA stump, about 1.5 cm in diameter.  No signs of infection no fluctuance. NEURO: Awake, alert and oriented appropriately.  No gross deficit.  PSYCH: Calm. Normal affect.   The results of significant diagnostics from this hospitalization (including imaging, microbiology, ancillary and laboratory) are listed below for reference.     Microbiology: Recent Results (from the past 240 hour(s))  MRSA PCR Screening     Status: Abnormal   Collection Time: 08/07/18 10:20 AM  Result Value Ref Range Status   MRSA by PCR POSITIVE (A) NEGATIVE Final    Comment:        The GeneXpert MRSA Assay (FDA approved for NASAL specimens only), is one component of a comprehensive MRSA colonization surveillance program. It is not intended to diagnose MRSA infection nor to guide or monitor treatment for MRSA infections. CRITICAL RESULT CALLED TO, READ BACK BY AND VERIFIED WITH: WHITE K AT 1312 ON 08/07/2018 BY SAINVILUS S Performed at [facility], [place] [address], [place], [place] [address]      Labs: BNP (last 3 results) No results for input(s): BNP in the last 8760 hours. Basic Metabolic Panel: Recent Labs  Lab 08/07/18 0411 08/07/18 0412 08/07/18 0929 08/08/18 0332  NA 116* 119* 132* 135  K 4.8 4.7 3.5 4.2  CL 85*  --  99 106  CO2 20*  --  22 20*  GLUCOSE 935*  --  155* 165*  BUN 34*  --  29* 26*  CREATININE 2.42*  --  2.15* 1.94*  CALCIUM 9.2  --  9.1 9.1  MG  --   --   --  2.2  PHOS  --   --   --  3.4   Liver Function Tests: No results for input(s): AST, ALT, ALKPHOS, BILITOT, PROT, ALBUMIN in the last 168 hours. No results for input(s): LIPASE, AMYLASE in the last 168 hours. No results for input(s): AMMONIA in the last 168 hours. CBC: Recent Labs  Lab 08/07/18 0411 08/07/18 0412  WBC 6.5  --   NEUTROABS 3.9  --   HGB 11.9* 12.9*  HCT 36.1* 38.0*  MCV 81.7  --   PLT 191  --     Cardiac Enzymes: No results for input(s): CKTOTAL, CKMB, CKMBINDEX, TROPONINI in the last 168 hours. BNP: Invalid input(s): POCBNP CBG: Recent Labs  Lab 08/07/18 1236  08/07/18 1338 08/07/18 1804 08/07/18 2105 08/08/18 0752  GLUCAP 162* 178* 338* 257* 158*   D-Dimer No results for input(s): DDIMER in the last 72 hours. Hgb A1c Recent Labs    08/07/18 0929  HGBA1C 15.0*   Lipid Profile No results for input(s): CHOL, HDL, LDLCALC, TRIG, CHOLHDL, LDLDIRECT in the last 72 hours. Thyroid function studies No results for input(s): TSH, T4TOTAL, T3FREE, THYROIDAB in the last 72 hours.  Invalid input(s): FREET3 Anemia work up No results for input(s): VITAMINB12, FOLATE, FERRITIN, TIBC, IRON, RETICCTPCT in the last 72 hours. Urinalysis    Component Value Date/Time   COLORURINE COLORLESS (A) 08/07/2018 0531   APPEARANCEUR CLEAR 08/07/2018 0531   LABSPEC 1.018 08/07/2018 0531   PHURINE 6.0 08/07/2018 0531   GLUCOSEU >=500 (A) 08/07/2018 0531   HGBUR SMALL (A) 08/07/2018 0531   BILIRUBINUR NEGATIVE 08/07/2018 0531   BILIRUBINUR negative 06/15/2018 1618   KETONESUR NEGATIVE 08/07/2018 0531   PROTEINUR 30 (A) 08/07/2018 0531   UROBILINOGEN 0.2 06/15/2018 1618   UROBILINOGEN 1.0 01/01/2017 1032   NITRITE NEGATIVE 08/07/2018 0531   LEUKOCYTESUR NEGATIVE 08/07/2018 0531   Sepsis Labs Invalid input(s): PROCALCITONIN,  WBC,  LACTICIDVEN   Time coordinating discharge: 35 minutes  SIGNED:  Mercy Riding, MD  [facility] 08/08/2018, 10:29 AM Pager [phone]  If 7PM-7AM, please contact night-coverage [email or web address] Password [email or web address]

## 2018-08-08 NOTE — Progress Notes (Signed)
Patient's ride will be in here 20 minutes. Per patient, he will check blood sugar and eat when he gets home.

## 2018-08-08 NOTE — Discharge Instructions (Signed)
Portion Size   Choose healthier foods such as 100% whole grains, vegetables, fruits, beans, nut seeds, olive oil, most vegetable oils, fat-free dietary, wild game and fish.   Avoid sweet tea, other sweetened beverages, soda, fruit juice, cold cereal and milk and trans fat.   Eat at least 3 meals and 1-2 snacks per day.  Aim for no more than 5 hours between eating.  Eat breakfast within one hour of getting up.    Exercise at least 150 minutes per week, including weight resistance exercises 3 or 4 times per week.   Hypoglycemia Hypoglycemia is when the sugar (glucose) level in your blood is too low. Signs of low blood sugar may include:  Feeling: ? Hungry. ? Worried or nervous (anxious). ? Sweaty and clammy. ? Confused. ? Dizzy. ? Sleepy. ? Sick to your stomach (nauseous).  Having: ? A fast heartbeat. ? A headache. ? A change in your vision. ? Tingling or no feeling (numbness) around your mouth, lips, or tongue. ? Jerky movements that you cannot control (seizure).  Having trouble with: ? Moving (coordination). ? Sleeping. ? Passing out (fainting). ? Getting upset easily (irritability). Low blood sugar can happen to people who have diabetes and people who do not have diabetes. Low blood sugar can happen quickly, and it can be an emergency. Treating low blood sugar Low blood sugar is often treated by eating or drinking something sugary right away, such as:  Fruit juice, 4-6 oz (120-150 mL).  Regular soda (not diet soda), 4-6 oz (120-150 mL).  Low-fat milk, 4 oz (120 mL).  Several pieces of hard candy.  Sugar or honey, 1 Tbsp (15 mL). Treating low blood sugar if you have diabetes If you can think clearly and swallow safely, follow the 15:15 rule:  Take 15 grams of a fast-acting carb (carbohydrate). Talk with your doctor about how much you should take.  Always keep a source of fast-acting carb with you, such as: ? Sugar tablets (glucose pills). Take 3-4  pills. ? 6-8 pieces of hard candy. ? 4-6 oz (120-150 mL) of fruit juice. ? 4-6 oz (120-150 mL) of regular (not diet) soda. ? 1 Tbsp (15 mL) honey or sugar.  Check your blood sugar 15 minutes after you take the carb.  If your blood sugar is still at or below 70 mg/dL (3.9 mmol/L), take 15 grams of a carb again.  If your blood sugar does not go above 70 mg/dL (3.9 mmol/L) after 3 tries, get help right away.  After your blood sugar goes back to normal, eat a meal or a snack within 1 hour.  Treating very low blood sugar If your blood sugar is at or below 54 mg/dL (3 mmol/L), you have very low blood sugar (severe hypoglycemia). This may also cause:  Passing out.  Jerky movements you cannot control (seizure).  Losing consciousness (coma). This is an emergency. Do not wait to see if the symptoms will go away. Get medical help right away. Call your local emergency services (911 in the [place] Do not drive yourself to the hospital. If you have very low blood sugar and you cannot eat or drink, you may need a glucagon shot (injection). A family member or friend should learn how to check your blood sugar and how to give you a glucagon shot. Ask your doctor if you need to have a glucagon shot kit at home. Follow these instructions at home: General instructions  Take over-the-counter and prescription medicines only as told by  your doctor.  Stay aware of your blood sugar as told by your doctor.  Limit alcohol intake to no more than 1 drink a day for nonpregnant women and 2 drinks a day for men. One drink equals 12 oz of beer (355 mL), 5 oz of wine (148 mL), or 1 oz of hard liquor (44 mL).  Keep all follow-up visits as told by your doctor. This is important. If you have diabetes:   Follow your diabetes care plan as told by your doctor. Make sure you: ? Know the signs of low blood sugar. ? Take your medicines as told. ? Follow your exercise and meal plan. ? Eat on time. Do not skip  meals. ? Check your blood sugar as often as told by your doctor. Always check it before and after exercise. ? Follow your sick day plan when you cannot eat or drink normally. Make this plan ahead of time with your doctor.  Share your diabetes care plan with: ? Your work or school. ? People you live with.  Check your pee (urine) for ketones: ? When you are sick. ? As told by your doctor.  Carry a card or wear jewelry that says you have diabetes. Contact a doctor if:  You have trouble keeping your blood sugar in your target range.  You have low blood sugar often. Get help right away if:  You still have symptoms after you eat or drink something sugary.  Your blood sugar is at or below 54 mg/dL (3 mmol/L).  You have jerky movements that you cannot control.  You pass out. These symptoms may be an emergency. Do not wait to see if the symptoms will go away. Get medical help right away. Call your local emergency services (911 in the [place] Do not drive yourself to the hospital. Summary  Hypoglycemia happens when the level of sugar (glucose) in your blood is too low.  Low blood sugar can happen to people who have diabetes and people who do not have diabetes. Low blood sugar can happen quickly, and it can be an emergency.  Make sure you know the signs of low blood sugar and know how to treat it.  Always keep a source of sugar (fast-acting carb) with you to treat low blood sugar. This information is not intended to replace advice given to you by your health care provider. Make sure you discuss any questions you have with your health care provider. Document Released: 06/19/2009 Document Revised: 09/16/2017 Document Reviewed: 04/28/2015 Elsevier Interactive Patient Education  2019 [employer].

## 2018-08-10 ENCOUNTER — Telehealth: Payer: Self-pay

## 2018-08-10 DIAGNOSIS — Z794 Long term (current) use of insulin: Secondary | ICD-10-CM | POA: Diagnosis not present

## 2018-08-10 DIAGNOSIS — E1151 Type 2 diabetes mellitus with diabetic peripheral angiopathy without gangrene: Secondary | ICD-10-CM | POA: Diagnosis not present

## 2018-08-10 DIAGNOSIS — E1142 Type 2 diabetes mellitus with diabetic polyneuropathy: Secondary | ICD-10-CM | POA: Diagnosis not present

## 2018-08-10 DIAGNOSIS — E1122 Type 2 diabetes mellitus with diabetic chronic kidney disease: Secondary | ICD-10-CM | POA: Diagnosis not present

## 2018-08-10 DIAGNOSIS — I10 Essential (primary) hypertension: Secondary | ICD-10-CM | POA: Diagnosis not present

## 2018-08-10 DIAGNOSIS — L89892 Pressure ulcer of other site, stage 2: Secondary | ICD-10-CM | POA: Diagnosis not present

## 2018-08-10 NOTE — Telephone Encounter (Signed)
FYI-Tracy the nurse from [facility] was letting us know that patient had been in the hospital for his sugars and was given Lantus and told to follow up with provider in 1-2 weeks. Patient was scheduled for tomorrow and canceled and reschedule for the 27th. Patient blood sugar were in the 800-900 range. Patient is suppose to pick up his glucose meter today to test sugars at home.

## 2018-08-11 ENCOUNTER — Ambulatory Visit: Payer: Medicare Other | Admitting: Family Medicine

## 2018-08-12 DIAGNOSIS — I10 Essential (primary) hypertension: Secondary | ICD-10-CM | POA: Diagnosis not present

## 2018-08-12 DIAGNOSIS — E1142 Type 2 diabetes mellitus with diabetic polyneuropathy: Secondary | ICD-10-CM | POA: Diagnosis not present

## 2018-08-12 DIAGNOSIS — L89892 Pressure ulcer of other site, stage 2: Secondary | ICD-10-CM | POA: Diagnosis not present

## 2018-08-12 DIAGNOSIS — E1151 Type 2 diabetes mellitus with diabetic peripheral angiopathy without gangrene: Secondary | ICD-10-CM | POA: Diagnosis not present

## 2018-08-12 DIAGNOSIS — Z794 Long term (current) use of insulin: Secondary | ICD-10-CM | POA: Diagnosis not present

## 2018-08-12 DIAGNOSIS — E1122 Type 2 diabetes mellitus with diabetic chronic kidney disease: Secondary | ICD-10-CM | POA: Diagnosis not present

## 2018-08-13 NOTE — Telephone Encounter (Signed)
Message sent to provider 

## 2018-08-14 DIAGNOSIS — Z794 Long term (current) use of insulin: Secondary | ICD-10-CM | POA: Diagnosis not present

## 2018-08-14 DIAGNOSIS — E1151 Type 2 diabetes mellitus with diabetic peripheral angiopathy without gangrene: Secondary | ICD-10-CM | POA: Diagnosis not present

## 2018-08-14 DIAGNOSIS — E1142 Type 2 diabetes mellitus with diabetic polyneuropathy: Secondary | ICD-10-CM | POA: Diagnosis not present

## 2018-08-14 DIAGNOSIS — I10 Essential (primary) hypertension: Secondary | ICD-10-CM | POA: Diagnosis not present

## 2018-08-14 DIAGNOSIS — E1122 Type 2 diabetes mellitus with diabetic chronic kidney disease: Secondary | ICD-10-CM | POA: Diagnosis not present

## 2018-08-14 DIAGNOSIS — L89892 Pressure ulcer of other site, stage 2: Secondary | ICD-10-CM | POA: Diagnosis not present

## 2018-08-19 DIAGNOSIS — E1142 Type 2 diabetes mellitus with diabetic polyneuropathy: Secondary | ICD-10-CM | POA: Diagnosis not present

## 2018-08-19 DIAGNOSIS — E1151 Type 2 diabetes mellitus with diabetic peripheral angiopathy without gangrene: Secondary | ICD-10-CM | POA: Diagnosis not present

## 2018-08-19 DIAGNOSIS — L89892 Pressure ulcer of other site, stage 2: Secondary | ICD-10-CM | POA: Diagnosis not present

## 2018-08-19 DIAGNOSIS — E1122 Type 2 diabetes mellitus with diabetic chronic kidney disease: Secondary | ICD-10-CM | POA: Diagnosis not present

## 2018-08-19 DIAGNOSIS — Z794 Long term (current) use of insulin: Secondary | ICD-10-CM | POA: Diagnosis not present

## 2018-08-19 DIAGNOSIS — I10 Essential (primary) hypertension: Secondary | ICD-10-CM | POA: Diagnosis not present

## 2018-08-21 ENCOUNTER — Ambulatory Visit: Payer: Self-pay | Admitting: Physician Assistant

## 2018-08-21 DIAGNOSIS — E1122 Type 2 diabetes mellitus with diabetic chronic kidney disease: Secondary | ICD-10-CM | POA: Diagnosis not present

## 2018-08-21 DIAGNOSIS — L89892 Pressure ulcer of other site, stage 2: Secondary | ICD-10-CM | POA: Diagnosis not present

## 2018-08-21 DIAGNOSIS — Z794 Long term (current) use of insulin: Secondary | ICD-10-CM | POA: Diagnosis not present

## 2018-08-21 DIAGNOSIS — E1142 Type 2 diabetes mellitus with diabetic polyneuropathy: Secondary | ICD-10-CM | POA: Diagnosis not present

## 2018-08-21 DIAGNOSIS — E1151 Type 2 diabetes mellitus with diabetic peripheral angiopathy without gangrene: Secondary | ICD-10-CM | POA: Diagnosis not present

## 2018-08-21 DIAGNOSIS — I10 Essential (primary) hypertension: Secondary | ICD-10-CM | POA: Diagnosis not present

## 2018-08-24 ENCOUNTER — Telehealth: Payer: Self-pay

## 2018-08-24 ENCOUNTER — Telehealth: Payer: Self-pay | Admitting: Family Medicine

## 2018-08-24 DIAGNOSIS — E1151 Type 2 diabetes mellitus with diabetic peripheral angiopathy without gangrene: Secondary | ICD-10-CM | POA: Diagnosis not present

## 2018-08-24 DIAGNOSIS — E1142 Type 2 diabetes mellitus with diabetic polyneuropathy: Secondary | ICD-10-CM | POA: Diagnosis not present

## 2018-08-24 DIAGNOSIS — E1122 Type 2 diabetes mellitus with diabetic chronic kidney disease: Secondary | ICD-10-CM | POA: Diagnosis not present

## 2018-08-24 DIAGNOSIS — I10 Essential (primary) hypertension: Secondary | ICD-10-CM | POA: Diagnosis not present

## 2018-08-24 DIAGNOSIS — L89892 Pressure ulcer of other site, stage 2: Secondary | ICD-10-CM | POA: Diagnosis not present

## 2018-08-24 DIAGNOSIS — Z794 Long term (current) use of insulin: Secondary | ICD-10-CM | POA: Diagnosis not present

## 2018-08-25 DIAGNOSIS — E669 Obesity, unspecified: Secondary | ICD-10-CM | POA: Diagnosis not present

## 2018-08-25 DIAGNOSIS — Z21 Asymptomatic human immunodeficiency virus [HIV] infection status: Secondary | ICD-10-CM | POA: Diagnosis not present

## 2018-08-25 DIAGNOSIS — L89892 Pressure ulcer of other site, stage 2: Secondary | ICD-10-CM | POA: Diagnosis not present

## 2018-08-25 DIAGNOSIS — E1151 Type 2 diabetes mellitus with diabetic peripheral angiopathy without gangrene: Secondary | ICD-10-CM | POA: Diagnosis not present

## 2018-08-25 DIAGNOSIS — Z6841 Body Mass Index (BMI) 40.0 and over, adult: Secondary | ICD-10-CM | POA: Diagnosis not present

## 2018-08-25 DIAGNOSIS — I10 Essential (primary) hypertension: Secondary | ICD-10-CM | POA: Diagnosis not present

## 2018-08-25 DIAGNOSIS — I872 Venous insufficiency (chronic) (peripheral): Secondary | ICD-10-CM | POA: Diagnosis not present

## 2018-08-25 DIAGNOSIS — Z89511 Acquired absence of right leg below knee: Secondary | ICD-10-CM | POA: Diagnosis not present

## 2018-08-25 DIAGNOSIS — E1142 Type 2 diabetes mellitus with diabetic polyneuropathy: Secondary | ICD-10-CM | POA: Diagnosis not present

## 2018-08-25 DIAGNOSIS — E1122 Type 2 diabetes mellitus with diabetic chronic kidney disease: Secondary | ICD-10-CM | POA: Diagnosis not present

## 2018-08-25 DIAGNOSIS — Z794 Long term (current) use of insulin: Secondary | ICD-10-CM | POA: Diagnosis not present

## 2018-08-25 DIAGNOSIS — N183 Chronic kidney disease, stage 3 (moderate): Secondary | ICD-10-CM | POA: Diagnosis not present

## 2018-08-26 ENCOUNTER — Telehealth: Payer: Self-pay | Admitting: Family Medicine

## 2018-08-26 DIAGNOSIS — I10 Essential (primary) hypertension: Secondary | ICD-10-CM | POA: Diagnosis not present

## 2018-08-26 DIAGNOSIS — E1122 Type 2 diabetes mellitus with diabetic chronic kidney disease: Secondary | ICD-10-CM | POA: Diagnosis not present

## 2018-08-26 DIAGNOSIS — E1142 Type 2 diabetes mellitus with diabetic polyneuropathy: Secondary | ICD-10-CM | POA: Diagnosis not present

## 2018-08-26 DIAGNOSIS — L89892 Pressure ulcer of other site, stage 2: Secondary | ICD-10-CM | POA: Diagnosis not present

## 2018-08-26 DIAGNOSIS — E1151 Type 2 diabetes mellitus with diabetic peripheral angiopathy without gangrene: Secondary | ICD-10-CM | POA: Diagnosis not present

## 2018-08-26 DIAGNOSIS — Z794 Long term (current) use of insulin: Secondary | ICD-10-CM | POA: Diagnosis not present

## 2018-08-26 NOTE — Telephone Encounter (Signed)
Sent to provider 

## 2018-08-26 NOTE — Telephone Encounter (Signed)
Sent to nurse

## 2018-08-27 ENCOUNTER — Telehealth: Payer: Self-pay

## 2018-08-27 NOTE — Telephone Encounter (Signed)
Patient has been having increased urination for 30 minutes with no pain or burning.

## 2018-08-28 DIAGNOSIS — L89892 Pressure ulcer of other site, stage 2: Secondary | ICD-10-CM | POA: Diagnosis not present

## 2018-08-28 DIAGNOSIS — E1151 Type 2 diabetes mellitus with diabetic peripheral angiopathy without gangrene: Secondary | ICD-10-CM | POA: Diagnosis not present

## 2018-08-28 DIAGNOSIS — E1122 Type 2 diabetes mellitus with diabetic chronic kidney disease: Secondary | ICD-10-CM | POA: Diagnosis not present

## 2018-08-28 DIAGNOSIS — I10 Essential (primary) hypertension: Secondary | ICD-10-CM | POA: Diagnosis not present

## 2018-08-28 DIAGNOSIS — Z794 Long term (current) use of insulin: Secondary | ICD-10-CM | POA: Diagnosis not present

## 2018-08-28 DIAGNOSIS — E1142 Type 2 diabetes mellitus with diabetic polyneuropathy: Secondary | ICD-10-CM | POA: Diagnosis not present

## 2018-08-30 ENCOUNTER — Other Ambulatory Visit: Payer: Self-pay | Admitting: Family Medicine

## 2018-08-30 DIAGNOSIS — R350 Frequency of micturition: Secondary | ICD-10-CM

## 2018-08-30 MED ORDER — OXYBUTYNIN CHLORIDE 5 MG PO TABS: 5.0000 mg | ORAL_TABLET | Freq: Two times a day (BID) | ORAL | 2 refills | Status: DC

## 2018-09-01 ENCOUNTER — Telehealth (HOSPITAL_COMMUNITY): Payer: Self-pay

## 2018-09-01 DIAGNOSIS — L89892 Pressure ulcer of other site, stage 2: Secondary | ICD-10-CM | POA: Diagnosis not present

## 2018-09-01 DIAGNOSIS — Z794 Long term (current) use of insulin: Secondary | ICD-10-CM | POA: Diagnosis not present

## 2018-09-01 DIAGNOSIS — E1151 Type 2 diabetes mellitus with diabetic peripheral angiopathy without gangrene: Secondary | ICD-10-CM | POA: Diagnosis not present

## 2018-09-01 DIAGNOSIS — I10 Essential (primary) hypertension: Secondary | ICD-10-CM | POA: Diagnosis not present

## 2018-09-01 DIAGNOSIS — E1142 Type 2 diabetes mellitus with diabetic polyneuropathy: Secondary | ICD-10-CM | POA: Diagnosis not present

## 2018-09-01 DIAGNOSIS — E1122 Type 2 diabetes mellitus with diabetic chronic kidney disease: Secondary | ICD-10-CM | POA: Diagnosis not present

## 2018-09-01 NOTE — Telephone Encounter (Signed)
Tried to contact patient for appointment screening no answer. VM was left for patient to contact office

## 2018-09-01 NOTE — Telephone Encounter (Signed)
Tried to contact patient vm was full and no answer

## 2018-09-01 NOTE — Telephone Encounter (Signed)
Telephone to check in with patient. Numbers listed under patient's name no longer in service. Called patient's mothers number and brother answered. States he would give his mother the information to call back. States he doesn't know how to reach his brother.   Cletis Media RN BSN C[facility] [place] [phone]

## 2018-09-02 ENCOUNTER — Ambulatory Visit: Payer: Medicare Other | Admitting: Family Medicine

## 2018-09-02 DIAGNOSIS — E1151 Type 2 diabetes mellitus with diabetic peripheral angiopathy without gangrene: Secondary | ICD-10-CM | POA: Diagnosis not present

## 2018-09-02 DIAGNOSIS — L89892 Pressure ulcer of other site, stage 2: Secondary | ICD-10-CM | POA: Diagnosis not present

## 2018-09-02 DIAGNOSIS — Z794 Long term (current) use of insulin: Secondary | ICD-10-CM | POA: Diagnosis not present

## 2018-09-02 DIAGNOSIS — E1142 Type 2 diabetes mellitus with diabetic polyneuropathy: Secondary | ICD-10-CM | POA: Diagnosis not present

## 2018-09-02 DIAGNOSIS — I10 Essential (primary) hypertension: Secondary | ICD-10-CM | POA: Diagnosis not present

## 2018-09-02 DIAGNOSIS — E1122 Type 2 diabetes mellitus with diabetic chronic kidney disease: Secondary | ICD-10-CM | POA: Diagnosis not present

## 2018-09-02 NOTE — Telephone Encounter (Signed)
Tried to contact patient no answer and patient no showed appointment

## 2018-09-04 DIAGNOSIS — E1142 Type 2 diabetes mellitus with diabetic polyneuropathy: Secondary | ICD-10-CM | POA: Diagnosis not present

## 2018-09-04 DIAGNOSIS — E1122 Type 2 diabetes mellitus with diabetic chronic kidney disease: Secondary | ICD-10-CM | POA: Diagnosis not present

## 2018-09-04 DIAGNOSIS — E1151 Type 2 diabetes mellitus with diabetic peripheral angiopathy without gangrene: Secondary | ICD-10-CM | POA: Diagnosis not present

## 2018-09-04 DIAGNOSIS — Z794 Long term (current) use of insulin: Secondary | ICD-10-CM | POA: Diagnosis not present

## 2018-09-04 DIAGNOSIS — L89892 Pressure ulcer of other site, stage 2: Secondary | ICD-10-CM | POA: Diagnosis not present

## 2018-09-04 DIAGNOSIS — I10 Essential (primary) hypertension: Secondary | ICD-10-CM | POA: Diagnosis not present

## 2018-09-07 ENCOUNTER — Encounter: Payer: Self-pay | Admitting: Family Medicine

## 2018-09-07 ENCOUNTER — Ambulatory Visit (INDEPENDENT_AMBULATORY_CARE_PROVIDER_SITE_OTHER): Payer: Medicare Other | Admitting: Family Medicine

## 2018-09-07 ENCOUNTER — Other Ambulatory Visit: Payer: Self-pay

## 2018-09-07 VITALS — BP 144/90 | HR 74 | Temp 98.0°F | Ht 70.0 in | Wt 260.0 lb

## 2018-09-07 DIAGNOSIS — R7309 Other abnormal glucose: Secondary | ICD-10-CM

## 2018-09-07 DIAGNOSIS — Z89511 Acquired absence of right leg below knee: Secondary | ICD-10-CM | POA: Diagnosis not present

## 2018-09-07 DIAGNOSIS — N529 Male erectile dysfunction, unspecified: Secondary | ICD-10-CM | POA: Diagnosis not present

## 2018-09-07 DIAGNOSIS — G894 Chronic pain syndrome: Secondary | ICD-10-CM | POA: Diagnosis not present

## 2018-09-07 DIAGNOSIS — E1169 Type 2 diabetes mellitus with other specified complication: Secondary | ICD-10-CM | POA: Diagnosis not present

## 2018-09-07 DIAGNOSIS — Z9189 Other specified personal risk factors, not elsewhere classified: Secondary | ICD-10-CM | POA: Diagnosis not present

## 2018-09-07 DIAGNOSIS — I10 Essential (primary) hypertension: Secondary | ICD-10-CM | POA: Diagnosis not present

## 2018-09-07 DIAGNOSIS — IMO0002 Reserved for concepts with insufficient information to code with codable children: Secondary | ICD-10-CM

## 2018-09-07 DIAGNOSIS — E1165 Type 2 diabetes mellitus with hyperglycemia: Secondary | ICD-10-CM

## 2018-09-07 DIAGNOSIS — Z794 Long term (current) use of insulin: Secondary | ICD-10-CM

## 2018-09-07 DIAGNOSIS — E559 Vitamin D deficiency, unspecified: Secondary | ICD-10-CM

## 2018-09-07 DIAGNOSIS — R739 Hyperglycemia, unspecified: Secondary | ICD-10-CM

## 2018-09-07 DIAGNOSIS — E1142 Type 2 diabetes mellitus with diabetic polyneuropathy: Secondary | ICD-10-CM | POA: Diagnosis not present

## 2018-09-07 DIAGNOSIS — E538 Deficiency of other specified B group vitamins: Secondary | ICD-10-CM

## 2018-09-07 DIAGNOSIS — Z09 Encounter for follow-up examination after completed treatment for conditions other than malignant neoplasm: Secondary | ICD-10-CM

## 2018-09-07 DIAGNOSIS — M545 Low back pain, unspecified: Secondary | ICD-10-CM

## 2018-09-07 DIAGNOSIS — E1151 Type 2 diabetes mellitus with diabetic peripheral angiopathy without gangrene: Secondary | ICD-10-CM | POA: Diagnosis not present

## 2018-09-07 DIAGNOSIS — E1122 Type 2 diabetes mellitus with diabetic chronic kidney disease: Secondary | ICD-10-CM | POA: Diagnosis not present

## 2018-09-07 DIAGNOSIS — B20 Human immunodeficiency virus [HIV] disease: Secondary | ICD-10-CM

## 2018-09-07 DIAGNOSIS — G8929 Other chronic pain: Secondary | ICD-10-CM

## 2018-09-07 DIAGNOSIS — L89892 Pressure ulcer of other site, stage 2: Secondary | ICD-10-CM | POA: Diagnosis not present

## 2018-09-07 DIAGNOSIS — R7989 Other specified abnormal findings of blood chemistry: Secondary | ICD-10-CM

## 2018-09-07 DIAGNOSIS — Z79899 Other long term (current) drug therapy: Secondary | ICD-10-CM | POA: Diagnosis not present

## 2018-09-07 LAB — POCT URINALYSIS DIP (MANUAL ENTRY)
Bilirubin, UA: NEGATIVE
Glucose, UA: 500 mg/dL — AB
Ketones, POC UA: NEGATIVE mg/dL
Leukocytes, UA: NEGATIVE
Nitrite, UA: NEGATIVE
Protein Ur, POC: 100 mg/dL — AB
Spec Grav, UA: 1.01 (ref 1.010–1.025)
Urobilinogen, UA: 1 E.U./dL
pH, UA: 5.5 (ref 5.0–8.0)

## 2018-09-07 LAB — GLUCOSE, POCT (MANUAL RESULT ENTRY)
POC Glucose: 439 mg/dl — AB (ref 70–99)
POC Glucose: 419 mg/dl — AB (ref 70–99)

## 2018-09-07 NOTE — Progress Notes (Signed)
Patient [name]   Established Patient Office Visit  Subjective:  [name], male    DOB: 05-05-1965  Age: 53 y.o. MRN: [id number]  CC:  Chief Complaint  Patient presents with   Follow-up    chronic condition     HPI Keith [name] is a 53 year old who presents for Follow Up today.   Past Medical History:  Diagnosis Date   Abscess of right foot    AIDS ([place])    Chronic knee pain    right   Chronic pain    Diabetes type 2, uncontrolled ([place])    HgA1c 17.6 (04/27/2010)   Diabetic foot ulcer ([place]) 01/2017   right foot   Erectile dysfunction    Genital warts    HIV (human immunodeficiency virus infection) ([place]) 2009   CD4 count 100, VL 13800 (05/01/2010)   Hypertension    Osteomyelitis ([place])    h/o hand   Osteomyelitis of metatarsal ([place]) 04/28/2017   Current Status: Since his last office visit, he is doing well with no complaints.  He denies visual changes, chest pain, cough, shortness of breath, heart palpitations, and falls. He has occasional headaches and dizziness with position changes. Denies severe headaches, confusion, seizures, double vision, and blurred vision, nausea and vomiting. He has not been monitoring his post and preprandial blood glucose levels. He denies fatigue, frequent urination, blurred vision, excessive hunger, excessive thirst, weight gain, weight loss, and poor wound healing. He continues to check left foot regularly. He continues to follow up with Infection Disease as needed. His last appointment with them was 05/2018. His next appointment is 09/2018. He states that he no longer takes Ditropan, because it does not work. He believes that his HIV medication is elevating his creatinine levels.  His anxiety is moderate today. He denies suicidal ideations, homicidal ideations, or auditory hallucinations. He has chronic generalized pain, which he continues to follow up with Pain Clinic  as needed. He has right BKA which is stable. He continues to follow up   He denies fevers, chills, recent infections, weight loss, and night sweats. No reports of GI problems such as diarrhea, and constipation. He has no reports of blood in stools, dysuria and hematuria.    Past Surgical History:  Procedure Laterality Date   AMPUTATION Right 05/02/2017   Procedure: AMPUTATION TRANSMETARSAL;  Surgeon: [name], MD;  Location: [place];  Service: Orthopedics;  Laterality: Right;   AMPUTATION Right 06/13/2017   Procedure: RIGHT BELOW KNEE AMPUTATION;  Surgeon: [name], MD;  Location: [place];  Service: Orthopedics;  Laterality: Right;   BELOW KNEE LEG AMPUTATION Right 06/13/2017   HERNIA REPAIR     I&D EXTREMITY Left 08/21/2014   Procedure: INCISION AND DRAINAGE LEFT SMALL FINGER;  Surgeon: Leanora Cover, MD;  Location: [facility];  Service: Orthopedics;  Laterality: Left;   I&D EXTREMITY Right 03/18/2017   Procedure: IRRIGATION AND DEBRIDEMENT EXTREMITY;  Surgeon: [name], MD;  Location: [place];  Service: Orthopedics;  Laterality: Right;   MINOR IRRIGATION AND DEBRIDEMENT OF WOUND Right 04/22/2014   Procedure: IRRIGATION AND DEBRIDEMENT OF RIGHT NECK ABCESS;  Surgeon: Jerrell Belfast, MD;  Location: [facility];  Service: ENT;  Laterality: Right;   MULTIPLE EXTRACTIONS WITH ALVEOLOPLASTY N/A 01/18/2013   Procedure: MULTIPLE EXTRACION 3, 6, 7, 10, 11, 13, 21, 22, 27, 28, 29, 30 WITH ALVEOLOPLASTY;  Surgeon: Gae Bon, DDS;  Location:  [facility];  Service: Oral Surgery;  Laterality: N/A;   SKIN SPLIT GRAFT Right 03/21/2017   Procedure: IRRIGATION AND DEBRIDEMENT RIGHT FOOT AND APPLY SPLIT THICKNESS SKIN GRAFT AND WOUND VAC;  Surgeon: [name], MD;  Location: [facility];  Service: Orthopedics;  Laterality: Right;   TEE WITHOUT CARDIOVERSION N/A 05/02/2017   Procedure: TRANSESOPHAGEAL ECHOCARDIOGRAM (TEE);  Surgeon: Acie Fredrickson, Wonda Cheng, MD;  Location: [facility];  Service: Cardiovascular;   Laterality: N/A;  coincidental to orthopedic case    Family History  Problem Relation Age of Onset   Hypertension Mother    Arthritis Father    Hypertension Father    Hypertension Brother    Cancer Maternal Grandmother 2       unknown type of cancer   Depression Paternal Grandmother     Social History   Socioeconomic History   Marital status: Single    Spouse name: Not on file   Number of children: 3   Years of education: 12   Highest education level: Not on file  Occupational History   Occupation: disabled  Scientist, product/process development strain: Not on file   Food insecurity:    Worry: Not on file    Inability: Not on file   Transportation needs:    Medical: Not on file    Non-medical: Not on file  Tobacco Use   Smoking status: Never Smoker   Smokeless tobacco: Never Used  Substance and Sexual Activity   Alcohol use: No    Alcohol/week: 0.0 standard drinks   Drug use: Not Currently    Comment: OTC ASA   Sexual activity: Yes    Partners: Female    Comment: given condoms  Lifestyle   Physical activity:    Days per week: Not on file    Minutes per session: Not on file   Stress: Not on file  Relationships   Social connections:    Talks on phone: Not on file    Gets together: Not on file    Attends religious service: Not on file    Active member of club or organization: Not on file    Attends meetings of clubs or organizations: Not on file    Relationship status: Not on file   Intimate partner violence:    Fear of current or ex partner: Not on file    Emotionally abused: Not on file    Physically abused: Not on file    Forced sexual activity: Not on file  Other Topics Concern   Not on file  Social History Narrative   Worked for the city of [place] for 18 years.   Unemployed.    Applying for disability.   Medicaid patient.    Outpatient Medications Prior to Visit  Medication Sig Dispense Refill   acetaminophen  (TYLENOL) 325 MG tablet Take 2 tablets (650 mg total) by mouth every 6 (six) hours as needed. 100 tablet 2   amLODipine (NORVASC) 10 MG tablet Take 1 tablet (10 mg total) by mouth daily. 30 tablet 2   blood glucose meter kit and supplies Dispense based on patient and insurance preference. Use up to four times daily as directed. (FOR ICD-10 E10.9, E11.9). 1 each 0   buprenorphine (BUTRANS) 20 MCG/HR PTWK patch Place 1 patch onto the skin.     escitalopram (LEXAPRO) 20 MG tablet Take 20 mg by mouth daily.     glucose blood (COOL BLOOD GLUCOSE TEST STRIPS) test strip Use as instructed 100 each  12   Insulin Glargine (LANTUS SOLOSTAR) 100 UNIT/ML Solostar Pen Inject 20 Units into the skin 2 (two) times daily. 15 mL 0   Insulin Pen Needle (B-D UF III MINI PEN NEEDLES) 31G X 5 MM MISC 20 Units by Does not apply route as directed. 100 each 2   Insulin Syringes, Disposable, U-100 0.3 ML MISC 1 Syringe by Does not apply route 3 (three) times daily. 100 each 11   Lancets (ONETOUCH ULTRASOFT) lancets Use as instructed 100 each 12   metFORMIN (GLUCOPHAGE) 500 MG tablet Take 1 tablet (500 mg total) by mouth 2 (two) times daily with a meal. 60 tablet 3   mupirocin ointment (BACTROBAN) 2 % Place 1 application into the nose 2 (two) times daily. 22 g 0   Oxycodone HCl 10 MG TABS Take 10 mg by mouth 4 (four) times daily.     PREZCOBIX 800-150 MG tablet TAKE 1 TABLET BY MOUTH DAILY. SWALLOW WHOLE. DO NOT CRUSH, BREAK OR CHEW TABLETS. TAKE WITH FOOD. (Patient taking differently: Take 1 tablet by mouth daily with breakfast. ) 30 tablet 0   TRIUMEQ 600-50-300 MG tablet TAKE 1 TABLET BY MOUTH DAILY. (Patient taking differently: Take 1 tablet by mouth daily. ) 30 tablet 0   oxybutynin (DITROPAN) 5 MG tablet Take 1 tablet (5 mg total) by mouth 2 (two) times daily. (Patient not taking: Reported on 09/07/2018) 60 tablet 2   tadalafil (ADCIRCA/CIALIS) 20 MG tablet Take 0.5-1 tablets (10-20 mg total) by mouth every  other day as needed for erectile dysfunction. (Patient not taking: Reported on 09/07/2018) 5 tablet 2   ALPRAZolam (XANAX) 0.5 MG tablet Take 0.5 mg by mouth 3 (three) times daily.     No facility-administered medications prior to visit.     Allergies  Allergen Reactions   Sulfur Itching    Patient stated he's allergic to "sulfur" AND "sulfa"   Sulfa Antibiotics Itching    ROS Review of Systems  Constitutional: Negative.   HENT: Negative.   Eyes: Negative.   Respiratory: Negative.   Cardiovascular: Negative.   Gastrointestinal: Positive for abdominal distention.  Endocrine: Negative.   Genitourinary: Negative.   Musculoskeletal: Positive for arthralgias (generalized) and neck stiffness.       Right BKA  Skin: Negative.   Allergic/Immunologic: Negative.   Neurological: Positive for dizziness and headaches.  Hematological: Negative.   Psychiatric/Behavioral: Negative.       Objective:    Physical Exam  Constitutional: He is oriented to person, place, and time. He appears well-developed and well-nourished.  HENT:  Head: Normocephalic and atraumatic.  Eyes: Conjunctivae are normal.  Neck: Normal range of motion. Neck supple.  Cardiovascular: Normal rate, regular rhythm, normal heart sounds and intact distal pulses.  Pulmonary/Chest: Effort normal and breath sounds normal.  Abdominal: Soft. He exhibits distension (Obese).  Musculoskeletal: Normal range of motion.     Comments: Right BKA  Neurological: He is alert and oriented to person, place, and time. He has normal reflexes.  Skin: Skin is warm and dry.  Psychiatric: He has a normal mood and affect. His behavior is normal. Judgment and thought content normal.  Nursing note and vitals reviewed.   BP (!) 144/90    Pulse 74    Temp 98 F (36.7 C) (Oral)    Ht 5' 10" (1.778 m)    Wt 260 lb (117.9 kg)    SpO2 98%    BMI 37.31 kg/m  Wt Readings from Last 3 Encounters:  09/07/18 260  lb (117.9 kg)  08/07/18 265 lb 10.5  oz (120.5 kg)  07/31/18 274 lb (124.3 kg)     Health Maintenance Due  Topic Date Due   OPHTHALMOLOGY EXAM  06/23/2012   COLONOSCOPY  11/18/2014   FOOT EXAM  02/23/2015   URINE MICROALBUMIN  02/23/2015   LIPID PANEL  10/25/2017    There are no preventive care reminders to display for this patient.  Lab Results  Component Value Date   TSH 2.700 09/07/2018   Lab Results  Component Value Date   WBC 5.6 09/07/2018   HGB 12.1 (L) 09/07/2018   HCT 37.5 09/07/2018   MCV 82 09/07/2018   PLT 252 09/07/2018   Lab Results  Component Value Date   NA 132 (L) 09/07/2018   K 4.5 09/07/2018   CO2 23 09/07/2018   GLUCOSE 470 (H) 09/07/2018   BUN 21 09/07/2018   CREATININE 1.91 (H) 09/07/2018   BILITOT 0.3 09/07/2018   ALKPHOS 143 (H) 09/07/2018   AST 16 09/07/2018   ALT 9 09/07/2018   PROT 7.9 09/07/2018   ALBUMIN 3.8 09/07/2018   CALCIUM 9.7 09/07/2018   ANIONGAP 9 08/08/2018   Lab Results  Component Value Date   CHOL 274 (H) 09/07/2018   Lab Results  Component Value Date   HDL 29 (L) 09/07/2018   Lab Results  Component Value Date   LDLCALC 194 (H) 09/07/2018   Lab Results  Component Value Date   TRIG 257 (H) 09/07/2018   Lab Results  Component Value Date   CHOLHDL 9.4 (H) 09/07/2018   Lab Results  Component Value Date   HGBA1C WILL FOLLOW 09/07/2018    Assessment & Plan:   1. Type 2 diabetes mellitus with other specified complication, with long-term current use of insulin (HCC) He will continue to decrease foods/beverages high in sugars and carbs and follow Heart Healthy or DASH diet. Increase physical activity to at least 30 minutes cardio exercise daily.  - POCT urinalysis dipstick - Hemoglobin A1c - CBC with Differential - Comprehensive metabolic panel - TSH - Lipid Panel  2. Elevated glucose Blood glucose increased today. He received 20 units of Humalog in office today.  - POCT glucose (manual entry)  - Hemoglobin A1c  3. Hyperglycemia -  Hemoglobin A1c  4. Uncontrolled type 2 diabetes mellitus with hyperglycemia (HCC) - Hemoglobin A1c  5. Insulin dependent type 2 diabetes mellitus, uncontrolled (HCC) - Hemoglobin A1c  6. Hx of right BKA Stable. He continues to wear right leg prosthesis.  7. Vitamin B12 deficiency - Vitamin B12  8. HIV disease ([place]) He will continue to follow up with Infection Disease as needed.   9. Chronic low back pain without sciatica, unspecified back pain laterality  10. Chronic pain syndrome  11. Essential hypertension The current medical regimen is effective; blood pressure is stable at 144/90 today; continue present plan and medications as prescribed. He will continue to decrease high sodium intake, excessive alcohol intake, increase potassium intake, smoking cessation, and increase physical activity of at least 30 minutes of cardio activity daily. He will continue to follow Heart Healthy or DASH diet.  12. Erectile dysfunction, unspecified erectile dysfunction type  13. Vitamin D deficiency - Vitamin D, 25-hydroxy  14. Elevated serum creatinine - Ambulatory referral to Nephrology  15. Follow up He will follow up in 1 month.  Meds ordered this encounter  Medications   insulin lispro (HUMALOG) injection 20 Units    Orders Placed This Encounter  Procedures  Hemoglobin A1c   Hemoglobin A1c   CBC with Differential   Comprehensive metabolic panel   TSH   Lipid Panel   Vitamin D, 25-hydroxy   Vitamin B12   Ambulatory referral to Nephrology   POCT glucose (manual entry)   POCT urinalysis dipstick   POCT glucose (manual entry)     Referral Orders     Ambulatory referral to Nephrology   Kathe Becton,  MSN, FNP-BC Patient [name] [place], [place] [phone]    Problem List Items Addressed This Visit      Cardiovascular and Mediastinum   Essential hypertension     Other   Chronic low back  pain without sciatica   Relevant Medications   buprenorphine (BUTRANS) 20 MCG/HR PTWK patch   Chronic pain (Chronic)   Relevant Medications   buprenorphine (BUTRANS) 20 MCG/HR PTWK patch   HIV disease ([place])    Other Visit Diagnoses    Type 2 diabetes mellitus with other specified complication, with long-term current use of insulin (HCC)    -  Primary   Relevant Medications   insulin lispro (HUMALOG) injection 20 Units   Other Relevant Orders   POCT urinalysis dipstick (Completed)   Hemoglobin A1c   CBC with Differential (Completed)   Comprehensive metabolic panel (Completed)   TSH (Completed)   Lipid Panel (Completed)   Elevated glucose       Relevant Orders   POCT glucose (manual entry) (Completed)   Hemoglobin A1c (Completed)   POCT glucose (manual entry) (Completed)   Hyperglycemia       Relevant Orders   Hemoglobin A1c (Completed)   Uncontrolled type 2 diabetes mellitus with hyperglycemia (HCC)       Relevant Medications   insulin lispro (HUMALOG) injection 20 Units   Other Relevant Orders   Hemoglobin A1c (Completed)   Insulin dependent type 2 diabetes mellitus, uncontrolled (HCC)       Relevant Medications   insulin lispro (HUMALOG) injection 20 Units   Other Relevant Orders   Hemoglobin A1c (Completed)   Hx of BKA, right (HCC)       Vitamin B12 deficiency       Relevant Orders   Vitamin B12 (Completed)   Erectile dysfunction, unspecified erectile dysfunction type       Vitamin D deficiency       Relevant Orders   Vitamin D, 25-hydroxy (Completed)   Elevated serum creatinine       Relevant Orders   Ambulatory referral to Nephrology   Follow up          Meds ordered this encounter  Medications   insulin lispro (HUMALOG) injection 20 Units    Follow-up: Return in about 1 month (around 10/07/2018).    [name], FNP

## 2018-09-08 LAB — COMPREHENSIVE METABOLIC PANEL
ALT: 9 IU/L (ref 0–44)
AST: 16 IU/L (ref 0–40)
Albumin/Globulin Ratio: 0.9 — ABNORMAL LOW (ref 1.2–2.2)
Albumin: 3.8 g/dL (ref 3.8–4.9)
Alkaline Phosphatase: 143 IU/L — ABNORMAL HIGH (ref 39–117)
BUN/Creatinine Ratio: 11 (ref 9–20)
BUN: 21 mg/dL (ref 6–24)
Bilirubin Total: 0.3 mg/dL (ref 0.0–1.2)
CO2: 23 mmol/L (ref 20–29)
Calcium: 9.7 mg/dL (ref 8.7–10.2)
Chloride: 93 mmol/L — ABNORMAL LOW (ref 96–106)
Creatinine, Ser: 1.91 mg/dL — ABNORMAL HIGH (ref 0.76–1.27)
GFR calc Af Amer: 45 mL/min/{1.73_m2} — ABNORMAL LOW (ref >59)
GFR calc non Af Amer: 39 mL/min/{1.73_m2} — ABNORMAL LOW (ref >59)
Globulin, Total: 4.1 g/dL (ref 1.5–4.5)
Glucose: 470 mg/dL — ABNORMAL HIGH (ref 65–99)
Potassium: 4.5 mmol/L (ref 3.5–5.2)
Sodium: 132 mmol/L — ABNORMAL LOW (ref 134–144)
Total Protein: 7.9 g/dL (ref 6.0–8.5)

## 2018-09-08 LAB — CBC WITH DIFFERENTIAL/PLATELET
Basophils Absolute: 0 10*3/uL (ref 0.0–0.2)
Basos: 1 %
EOS (ABSOLUTE): 0.1 10*3/uL (ref 0.0–0.4)
Eos: 1 %
Hematocrit: 37.5 % (ref 37.5–51.0)
Hemoglobin: 12.1 g/dL — ABNORMAL LOW (ref 13.0–17.7)
Immature Grans (Abs): 0 10*3/uL (ref 0.0–0.1)
Immature Granulocytes: 0 %
Lymphocytes Absolute: 1.4 10*3/uL (ref 0.7–3.1)
Lymphs: 25 %
MCH: 26.4 pg — ABNORMAL LOW (ref 26.6–33.0)
MCHC: 32.3 g/dL (ref 31.5–35.7)
MCV: 82 fL (ref 79–97)
Monocytes Absolute: 0.3 10*3/uL (ref 0.1–0.9)
Monocytes: 6 %
Neutrophils Absolute: 3.8 10*3/uL (ref 1.4–7.0)
Neutrophils: 67 %
Platelets: 252 10*3/uL (ref 150–450)
RBC: 4.58 x10E6/uL (ref 4.14–5.80)
RDW: 12.6 % (ref 11.6–15.4)
WBC: 5.6 10*3/uL (ref 3.4–10.8)

## 2018-09-08 LAB — LIPID PANEL
Chol/HDL Ratio: 9.4 ratio — ABNORMAL HIGH (ref 0.0–5.0)
Cholesterol, Total: 274 mg/dL — ABNORMAL HIGH (ref 100–199)
HDL: 29 mg/dL — ABNORMAL LOW (ref >39)
LDL Calculated: 194 mg/dL — ABNORMAL HIGH (ref 0–99)
Triglycerides: 257 mg/dL — ABNORMAL HIGH (ref 0–149)
VLDL Cholesterol Cal: 51 mg/dL — ABNORMAL HIGH (ref 5–40)

## 2018-09-08 LAB — VITAMIN B12: Vitamin B-12: 780 pg/mL (ref 232–1245)

## 2018-09-08 LAB — HEMOGLOBIN A1C
Est. average glucose Bld gHb Est-mCnc: >398 mg/dL
Hgb A1c MFr Bld: >15.5 % — ABNORMAL HIGH (ref 4.8–5.6)

## 2018-09-08 LAB — TSH: TSH: 2.7 u[IU]/mL (ref 0.450–4.500)

## 2018-09-08 LAB — VITAMIN D 25 HYDROXY (VIT D DEFICIENCY, FRACTURES): Vit D, 25-Hydroxy: 8.2 ng/mL — ABNORMAL LOW (ref 30.0–100.0)

## 2018-09-08 MED ORDER — INSULIN LISPRO 100 UNIT/ML ~~LOC~~ SOLN
20.0000 [IU] | Freq: Once | SUBCUTANEOUS | Status: AC
  Administered 2018-09-07: 20 [IU] via SUBCUTANEOUS

## 2018-09-09 DIAGNOSIS — E1122 Type 2 diabetes mellitus with diabetic chronic kidney disease: Secondary | ICD-10-CM | POA: Diagnosis not present

## 2018-09-09 DIAGNOSIS — E1151 Type 2 diabetes mellitus with diabetic peripheral angiopathy without gangrene: Secondary | ICD-10-CM | POA: Diagnosis not present

## 2018-09-09 DIAGNOSIS — E1142 Type 2 diabetes mellitus with diabetic polyneuropathy: Secondary | ICD-10-CM | POA: Diagnosis not present

## 2018-09-09 DIAGNOSIS — L89892 Pressure ulcer of other site, stage 2: Secondary | ICD-10-CM | POA: Diagnosis not present

## 2018-09-09 DIAGNOSIS — I10 Essential (primary) hypertension: Secondary | ICD-10-CM | POA: Diagnosis not present

## 2018-09-09 DIAGNOSIS — Z794 Long term (current) use of insulin: Secondary | ICD-10-CM | POA: Diagnosis not present

## 2018-09-10 DIAGNOSIS — F329 Major depressive disorder, single episode, unspecified: Secondary | ICD-10-CM | POA: Diagnosis not present

## 2018-09-10 DIAGNOSIS — F411 Generalized anxiety disorder: Secondary | ICD-10-CM | POA: Diagnosis not present

## 2018-09-11 ENCOUNTER — Telehealth: Payer: Self-pay

## 2018-09-11 DIAGNOSIS — L89892 Pressure ulcer of other site, stage 2: Secondary | ICD-10-CM | POA: Diagnosis not present

## 2018-09-11 DIAGNOSIS — E1142 Type 2 diabetes mellitus with diabetic polyneuropathy: Secondary | ICD-10-CM | POA: Diagnosis not present

## 2018-09-11 DIAGNOSIS — E1122 Type 2 diabetes mellitus with diabetic chronic kidney disease: Secondary | ICD-10-CM | POA: Diagnosis not present

## 2018-09-11 DIAGNOSIS — Z794 Long term (current) use of insulin: Secondary | ICD-10-CM | POA: Diagnosis not present

## 2018-09-11 DIAGNOSIS — E1151 Type 2 diabetes mellitus with diabetic peripheral angiopathy without gangrene: Secondary | ICD-10-CM | POA: Diagnosis not present

## 2018-09-11 DIAGNOSIS — I10 Essential (primary) hypertension: Secondary | ICD-10-CM | POA: Diagnosis not present

## 2018-09-11 NOTE — Telephone Encounter (Signed)
Patient states he has not taken Triumeq in 4 months due to being told the medication was causing him kidney failure.  He walked into office today to request a new medication.   Patient offered appointment with Dr [name] to discuss restart of medications.   [employer] schedueld for 09-17-2018.

## 2018-09-14 ENCOUNTER — Other Ambulatory Visit: Payer: Self-pay | Admitting: Family Medicine

## 2018-09-14 ENCOUNTER — Telehealth: Payer: Self-pay

## 2018-09-14 DIAGNOSIS — R7309 Other abnormal glucose: Secondary | ICD-10-CM

## 2018-09-14 DIAGNOSIS — E1151 Type 2 diabetes mellitus with diabetic peripheral angiopathy without gangrene: Secondary | ICD-10-CM | POA: Diagnosis not present

## 2018-09-14 DIAGNOSIS — Z794 Long term (current) use of insulin: Secondary | ICD-10-CM | POA: Diagnosis not present

## 2018-09-14 DIAGNOSIS — E1169 Type 2 diabetes mellitus with other specified complication: Secondary | ICD-10-CM

## 2018-09-14 DIAGNOSIS — I10 Essential (primary) hypertension: Secondary | ICD-10-CM | POA: Diagnosis not present

## 2018-09-14 DIAGNOSIS — E1142 Type 2 diabetes mellitus with diabetic polyneuropathy: Secondary | ICD-10-CM | POA: Diagnosis not present

## 2018-09-14 DIAGNOSIS — L89892 Pressure ulcer of other site, stage 2: Secondary | ICD-10-CM | POA: Diagnosis not present

## 2018-09-14 DIAGNOSIS — E1122 Type 2 diabetes mellitus with diabetic chronic kidney disease: Secondary | ICD-10-CM | POA: Diagnosis not present

## 2018-09-14 DIAGNOSIS — E1165 Type 2 diabetes mellitus with hyperglycemia: Secondary | ICD-10-CM

## 2018-09-14 MED ORDER — GLIPIZIDE 10 MG PO TABS: 10.0000 mg | ORAL_TABLET | Freq: Two times a day (BID) | ORAL | 3 refills | Status: DC

## 2018-09-14 MED ORDER — METFORMIN HCL 1000 MG PO TABS: 1000.0000 mg | ORAL_TABLET | Freq: Two times a day (BID) | ORAL | 3 refills | Status: DC

## 2018-09-14 MED ORDER — INSULIN LISPRO 100 UNIT/ML ~~LOC~~ SOLN: SUBCUTANEOUS | 3 refills | Status: DC

## 2018-09-14 MED ORDER — INSULIN GLARGINE 100 UNIT/ML SOLOSTAR PEN: 30.0000 [IU] | PEN_INJECTOR | Freq: Every day | SUBCUTANEOUS | 99 refills | Status: DC

## 2018-09-14 NOTE — Telephone Encounter (Signed)
-----   Message from [name], [place] sent at 09/14/2018 11:48 AM EDT ----- Patient is aware that blood glucose levels are elevated. Lantus and Metformin dosage is increased. Humalog and Glucotrol added. Rxs sent to pharmacy today. He is to take medications as prescribed.  He will continue to decrease foods/beverages high in sugars and carbs and follow Heart Healthy or DASH diet. Increase physical activity to at least 30 minutes cardio exercise daily. Please inform patient. Thank you.

## 2018-09-14 NOTE — Telephone Encounter (Signed)
Called and spoke with patient. Advised that Lantus and metformin had increased. Advised that Humalog and Glucotrol had been added to medications, when over dosages of all these medications. Asked that he continue to decrease foods/beverages that are high in carbs and sugars. Asked that He follow a heart healthy diet and increase physical activity to at least 30 minutes of cardio a day. Patient verbalized understanding. Thanks!

## 2018-09-16 ENCOUNTER — Telehealth: Payer: Self-pay | Admitting: Orthopedic Surgery

## 2018-09-16 ENCOUNTER — Telehealth: Payer: Self-pay | Admitting: Infectious Diseases

## 2018-09-16 NOTE — Telephone Encounter (Signed)
FYI:  Patient not home for scheduled Nursing Visit  [phone]

## 2018-09-16 NOTE — Telephone Encounter (Signed)
COVID-19 Pre-Screening Questions: ° °Do you currently have a fever (>100 °F), chills or unexplained body aches? No  ° °Are you currently experiencing new cough, shortness of breath, sore throat, runny nose? No  °•  °Have you recently travelled outside the state of [place] in the last 14 days?no  °•  °Have you been in contact with someone that is currently pending confirmation of Covid19 testing or has been confirmed to have the Covid19 virus?  No  °

## 2018-09-16 NOTE — Telephone Encounter (Signed)
Noted  

## 2018-09-17 ENCOUNTER — Ambulatory Visit: Payer: Medicare Other | Admitting: Infectious Diseases

## 2018-09-17 ENCOUNTER — Other Ambulatory Visit: Payer: Self-pay

## 2018-09-18 DIAGNOSIS — E1151 Type 2 diabetes mellitus with diabetic peripheral angiopathy without gangrene: Secondary | ICD-10-CM | POA: Diagnosis not present

## 2018-09-18 DIAGNOSIS — L89892 Pressure ulcer of other site, stage 2: Secondary | ICD-10-CM | POA: Diagnosis not present

## 2018-09-18 DIAGNOSIS — E1122 Type 2 diabetes mellitus with diabetic chronic kidney disease: Secondary | ICD-10-CM | POA: Diagnosis not present

## 2018-09-18 DIAGNOSIS — E1142 Type 2 diabetes mellitus with diabetic polyneuropathy: Secondary | ICD-10-CM | POA: Diagnosis not present

## 2018-09-18 DIAGNOSIS — I10 Essential (primary) hypertension: Secondary | ICD-10-CM | POA: Diagnosis not present

## 2018-09-18 DIAGNOSIS — Z794 Long term (current) use of insulin: Secondary | ICD-10-CM | POA: Diagnosis not present

## 2018-09-22 ENCOUNTER — Other Ambulatory Visit: Payer: Self-pay

## 2018-09-22 ENCOUNTER — Emergency Department (HOSPITAL_COMMUNITY)
Admission: EM | Admit: 2018-09-22 | Discharge: 2018-09-22 | Disposition: A | Discharge status: Home / Self Care | Payer: Medicare Other | Attending: Emergency Medicine | Admitting: Emergency Medicine

## 2018-09-22 ENCOUNTER — Encounter (HOSPITAL_COMMUNITY): Payer: Self-pay | Admitting: *Deleted

## 2018-09-22 DIAGNOSIS — Z89511 Acquired absence of right leg below knee: Secondary | ICD-10-CM | POA: Diagnosis not present

## 2018-09-22 DIAGNOSIS — B20 Human immunodeficiency virus [HIV] disease: Secondary | ICD-10-CM | POA: Diagnosis not present

## 2018-09-22 DIAGNOSIS — Z794 Long term (current) use of insulin: Secondary | ICD-10-CM | POA: Diagnosis not present

## 2018-09-22 DIAGNOSIS — Z79899 Other long term (current) drug therapy: Secondary | ICD-10-CM | POA: Diagnosis not present

## 2018-09-22 DIAGNOSIS — G8929 Other chronic pain: Secondary | ICD-10-CM | POA: Insufficient documentation

## 2018-09-22 DIAGNOSIS — L89892 Pressure ulcer of other site, stage 2: Secondary | ICD-10-CM | POA: Diagnosis not present

## 2018-09-22 DIAGNOSIS — E1151 Type 2 diabetes mellitus with diabetic peripheral angiopathy without gangrene: Secondary | ICD-10-CM | POA: Diagnosis not present

## 2018-09-22 DIAGNOSIS — E119 Type 2 diabetes mellitus without complications: Secondary | ICD-10-CM

## 2018-09-22 DIAGNOSIS — M79604 Pain in right leg: Secondary | ICD-10-CM | POA: Diagnosis not present

## 2018-09-22 DIAGNOSIS — I10 Essential (primary) hypertension: Secondary | ICD-10-CM | POA: Insufficient documentation

## 2018-09-22 DIAGNOSIS — E1122 Type 2 diabetes mellitus with diabetic chronic kidney disease: Secondary | ICD-10-CM | POA: Diagnosis not present

## 2018-09-22 DIAGNOSIS — E1142 Type 2 diabetes mellitus with diabetic polyneuropathy: Secondary | ICD-10-CM | POA: Diagnosis not present

## 2018-09-22 DIAGNOSIS — M545 Low back pain: Secondary | ICD-10-CM | POA: Diagnosis not present

## 2018-09-22 MED ORDER — BD PEN NEEDLE MINI U/F 31G X 5 MM MISC: 20.0000 [IU] | 5 refills | Status: DC

## 2018-09-22 MED ORDER — INSULIN SYRINGES (DISPOSABLE) U-100 0.3 ML MISC: 1.0000 | Freq: Three times a day (TID) | 11 refills | Status: DC

## 2018-09-22 MED ORDER — OXYCODONE HCL 5 MG PO TABS
10.0000 mg | ORAL_TABLET | Freq: Once | ORAL | Status: AC
  Administered 2018-09-22: 10 mg via ORAL
  Filled 2018-09-22: qty 2

## 2018-09-22 NOTE — ED Provider Notes (Signed)
TIME SEEN: 3:44 AM  CHIEF COMPLAINT: right leg pain  HPI: Patient is a 53 year old male with history of HIV, diabetes, hypertension, chronic pain, right BKA who presents to the emergency department with 2 to 3 days of pain in the right BKA.  States it is normal for him to have pain when the weather changes and when it rains.  He denies any injury.  No wound to this area.  No fevers.  States his blood sugars have been in the 130s.  No polydipsia, polyuria, nausea or vomiting.  States that he is on chronic pain medication and he ran out yesterday.  He states he has an appointment to see his pain doctor tomorrow.  States he is here for a dose of pain medication.  ROS: See HPI Constitutional: no fever  Eyes: no drainage  ENT: no runny nose   Cardiovascular:  no chest pain  Resp: no SOB  GI: no vomiting GU: no dysuria Integumentary: no rash  Allergy: no hives  Musculoskeletal: no leg swelling  Neurological: no slurred speech ROS otherwise negative  PAST MEDICAL HISTORY/PAST SURGICAL HISTORY:  Past Medical History:  Diagnosis Date  . Abscess of right foot   . AIDS ([place])   . Chronic knee pain    right  . Chronic pain   . Diabetes type 2, uncontrolled ([place])    HgA1c 17.6 (04/27/2010)  . Diabetic foot ulcer ([place]) 01/2017   right foot  . Erectile dysfunction   . Genital warts   . HIV (human immunodeficiency virus infection) ([place]) 2009   CD4 count 100, VL 13800 (05/01/2010)  . Hypertension   . Osteomyelitis ([place])    h/o hand  . Osteomyelitis of metatarsal ([place]) 04/28/2017    MEDICATIONS:  Prior to Admission medications   Medication Sig Start Date End Date Taking? Authorizing Provider  acetaminophen (TYLENOL) 325 MG tablet Take 2 tablets (650 mg total) by mouth every 6 (six) hours as needed. 08/08/18 08/08/19  Mercy Riding, MD  amLODipine (NORVASC) 10 MG tablet Take 1 tablet (10 mg total) by mouth daily. 07/09/18   [name], FNP  blood glucose meter kit and supplies Dispense  based on patient and insurance preference. Use up to four times daily as directed. (FOR ICD-10 E10.9, E11.9). 07/31/18   [name], FNP  buprenorphine (BUTRANS) 20 MCG/HR PTWK patch Place 1 patch onto the skin. 08/14/18   [provider]  escitalopram (LEXAPRO) 20 MG tablet Take 20 mg by mouth daily. 07/15/18   [provider]  glipiZIDE (GLUCOTROL) 10 MG tablet Take 1 tablet (10 mg total) by mouth 2 (two) times daily before a meal. 09/14/18   [name], FNP  glucose blood (COOL BLOOD GLUCOSE TEST STRIPS) test strip Use as instructed 01/05/18   [name], FNP  Insulin Glargine (LANTUS SOLOSTAR) 100 UNIT/ML Solostar Pen Inject 30 Units into the skin daily. 09/14/18   [name], FNP  insulin lispro (HUMALOG) 100 UNIT/ML injection Inject 10 units under skin 3 times a day, with meals, for blood glucose levels greater than 125. 09/14/18 09/14/19  [name], FNP  Insulin Pen Needle (B-D UF III MINI PEN NEEDLES) 31G X 5 MM MISC 20 Units by Does not apply route as directed. 01/19/18   [name], FNP  Insulin Syringes, Disposable, U-100 0.3 ML MISC 1 Syringe by Does not apply route 3 (three) times daily. 01/05/18   [name], FNP  Lancets [facility] ULTRASOFT) lancets  Use as instructed 01/05/18   [name], FNP  metFORMIN (GLUCOPHAGE) 1000 MG tablet Take 1 tablet (1,000 mg total) by mouth 2 (two) times daily with a meal. 09/14/18   [name], FNP  mupirocin ointment (BACTROBAN) 2 % Place 1 application into the nose 2 (two) times daily. 07/10/18   Rayburn, Neta Mends, PA-C  oxybutynin (DITROPAN) 5 MG tablet Take 1 tablet (5 mg total) by mouth 2 (two) times daily. Patient not taking: Reported on 09/07/2018 08/30/18   [name], FNP  Oxycodone HCl 10 MG TABS Take 10 mg by mouth 4 (four) times daily. 07/09/18   [provider]  PREZCOBIX 800-150 MG tablet TAKE 1 TABLET BY MOUTH DAILY. SWALLOW WHOLE. DO NOT CRUSH, BREAK OR CHEW  TABLETS. TAKE WITH FOOD. Patient taking differently: Take 1 tablet by mouth daily with breakfast.  07/13/18   [name], MD  tadalafil (ADCIRCA/CIALIS) 20 MG tablet Take 0.5-1 tablets (10-20 mg total) by mouth every other day as needed for erectile dysfunction. Patient not taking: Reported on 09/07/2018 11/05/17   [name], FNP  TRIUMEQ 600-50-300 MG tablet TAKE 1 TABLET BY MOUTH DAILY. Patient taking differently: Take 1 tablet by mouth daily.  07/13/18   [name], MD    ALLERGIES:  Allergies  Allergen Reactions  . Sulfur Itching    Patient stated he's allergic to "sulfur" AND "sulfa"  . Sulfa Antibiotics Itching    SOCIAL HISTORY:  Social History   Tobacco Use  . Smoking status: Never Smoker  . Smokeless tobacco: Never Used  Substance Use Topics  . Alcohol use: No    Alcohol/week: 0.0 standard drinks    FAMILY HISTORY: Family History  Problem Relation Age of Onset  . Hypertension Mother   . Arthritis Father   . Hypertension Father   . Hypertension Brother   . Cancer Maternal Grandmother 12       unknown type of cancer  . Depression Paternal Grandmother     EXAM: BP (!) 144/99 (BP Location: Right Arm)   Pulse 77   Temp 99 F (37.2 C) (Oral)   Resp 18   SpO2 100%  CONSTITUTIONAL: Alert and oriented and responds appropriately to questions. Well-appearing; well-nourished HEAD: Normocephalic EYES: Conjunctivae clear, pupils appear equal, EOMI ENT: normal nose; moist mucous membranes NECK: Supple, normal range of motion CARD: RRR RESP: Normal chest excursion without splinting or tachypnea;  no hypoxia or respiratory distress, speaking full sentences ABD/GI: Nondistended BACK:  The back appears normal EXT: Patient has some tenderness over the right BKA.  There are no wounds, redness or warmth.  No swelling or ecchymosis.  No joint effusion to the right knee.  No bony abnormality.  Extremities warm and well perfused.  Compartments are soft. SKIN:  Normal color for age and race; warm; no rash NEURO: Moves all extremities equally, ambulates with normal gait PSYCH: The patient's mood and manner are appropriate. Grooming and personal hygiene are appropriate.  MEDICAL DECISION MAKING: Patient here with pain in the right BKA.  States this is typical for him when the temperatures change.  No infectious symptoms.  No recent injury.  No lesions noted to his skin.  States that he ran out of his chronic pain medicine yesterday.  Requesting a dose of his oral pain medication here and states he has follow-up tomorrow.  Will give 1 dose of oxycodone 10 mg as this appears to be what he takes along with buprenorphine patches.  We will have him follow-up with his pain management doctor.  I do not feel he needs x-rays at this time.  He agrees.  I feel he is safe to be discharged home.  At this time, I do not feel there is any life-threatening condition present. I have reviewed and discussed all results (EKG, imaging, lab, urine as appropriate) and exam findings with patient/family. I have reviewed nursing notes and appropriate previous records.  I feel the patient is safe to be discharged home without further emergent workup and can continue workup as an outpatient as needed. Discussed usual and customary return precautions. Patient/family verbalize understanding and are comfortable with this plan.  Outpatient follow-up has been provided as needed. All questions have been answered.      Fizza Scales, Delice Bison, DO 09/22/18 [phone]

## 2018-09-22 NOTE — Telephone Encounter (Signed)
Medication sent to pharmacy  

## 2018-09-22 NOTE — ED Triage Notes (Signed)
Pt reports since it has been raining, his R leg has had increased pain; prothesis noted to amputation

## 2018-09-22 NOTE — Discharge Instructions (Addendum)
Please follow-up with your pain management specialist today.

## 2018-10-05 DIAGNOSIS — Z79899 Other long term (current) drug therapy: Secondary | ICD-10-CM | POA: Diagnosis not present

## 2018-10-05 DIAGNOSIS — M545 Low back pain: Secondary | ICD-10-CM | POA: Diagnosis not present

## 2018-10-06 ENCOUNTER — Telehealth: Payer: Self-pay

## 2018-10-06 NOTE — Telephone Encounter (Signed)
Left a vm for patient to callback and do the screening

## 2018-10-07 ENCOUNTER — Ambulatory Visit: Payer: Medicare Other | Admitting: Family Medicine

## 2018-10-11 ENCOUNTER — Encounter (HOSPITAL_COMMUNITY): Payer: Self-pay | Admitting: *Deleted

## 2018-10-11 ENCOUNTER — Emergency Department (HOSPITAL_COMMUNITY): Payer: Medicare Other

## 2018-10-11 ENCOUNTER — Other Ambulatory Visit: Payer: Self-pay

## 2018-10-11 ENCOUNTER — Emergency Department (HOSPITAL_COMMUNITY)
Admission: EM | Admit: 2018-10-11 | Discharge: 2018-10-11 | Disposition: A | Discharge status: Home / Self Care | Payer: Medicare Other | Attending: Emergency Medicine | Admitting: Emergency Medicine

## 2018-10-11 DIAGNOSIS — Z794 Long term (current) use of insulin: Secondary | ICD-10-CM | POA: Diagnosis not present

## 2018-10-11 DIAGNOSIS — R197 Diarrhea, unspecified: Secondary | ICD-10-CM | POA: Insufficient documentation

## 2018-10-11 DIAGNOSIS — B20 Human immunodeficiency virus [HIV] disease: Secondary | ICD-10-CM | POA: Diagnosis not present

## 2018-10-11 DIAGNOSIS — Z79899 Other long term (current) drug therapy: Secondary | ICD-10-CM | POA: Diagnosis not present

## 2018-10-11 DIAGNOSIS — R531 Weakness: Secondary | ICD-10-CM

## 2018-10-11 DIAGNOSIS — Z03818 Encounter for observation for suspected exposure to other biological agents ruled out: Secondary | ICD-10-CM | POA: Diagnosis not present

## 2018-10-11 DIAGNOSIS — E1165 Type 2 diabetes mellitus with hyperglycemia: Secondary | ICD-10-CM | POA: Diagnosis not present

## 2018-10-11 DIAGNOSIS — R739 Hyperglycemia, unspecified: Secondary | ICD-10-CM

## 2018-10-11 DIAGNOSIS — R5383 Other fatigue: Secondary | ICD-10-CM

## 2018-10-11 DIAGNOSIS — I10 Essential (primary) hypertension: Secondary | ICD-10-CM | POA: Diagnosis not present

## 2018-10-11 LAB — CBG MONITORING, ED
Glucose-Capillary: 382 mg/dL — ABNORMAL HIGH (ref 70–99)
Glucose-Capillary: 353 mg/dL — ABNORMAL HIGH (ref 70–99)
Glucose-Capillary: 297 mg/dL — ABNORMAL HIGH (ref 70–99)

## 2018-10-11 LAB — SARS CORONAVIRUS 2 BY RT PCR (HOSPITAL ORDER, PERFORMED IN ~~LOC~~ HOSPITAL LAB): SARS Coronavirus 2: NEGATIVE

## 2018-10-11 LAB — RAPID URINE DRUG SCREEN, HOSP PERFORMED
Amphetamines: NOT DETECTED
Barbiturates: NOT DETECTED
Benzodiazepines: NOT DETECTED
Cocaine: NOT DETECTED
Opiates: NOT DETECTED
Tetrahydrocannabinol: NOT DETECTED

## 2018-10-11 LAB — URINALYSIS, ROUTINE W REFLEX MICROSCOPIC
Bacteria, UA: NONE SEEN
Bilirubin Urine: NEGATIVE
Glucose, UA: >=500 mg/dL — AB
Ketones, ur: NEGATIVE mg/dL
Nitrite: NEGATIVE
Protein, ur: 100 mg/dL — AB
Specific Gravity, Urine: 1.014 (ref 1.005–1.030)
pH: 6 (ref 5.0–8.0)

## 2018-10-11 LAB — COMPREHENSIVE METABOLIC PANEL
ALT: 15 U/L (ref 0–44)
AST: 17 U/L (ref 15–41)
Albumin: 2.9 g/dL — ABNORMAL LOW (ref 3.5–5.0)
Alkaline Phosphatase: 103 U/L (ref 38–126)
Anion gap: 10 (ref 5–15)
BUN: 19 mg/dL (ref 6–20)
CO2: 22 mmol/L (ref 22–32)
Calcium: 9.1 mg/dL (ref 8.9–10.3)
Chloride: 99 mmol/L (ref 98–111)
Creatinine, Ser: 1.68 mg/dL — ABNORMAL HIGH (ref 0.61–1.24)
GFR calc Af Amer: 53 mL/min — ABNORMAL LOW (ref >60)
GFR calc non Af Amer: 46 mL/min — ABNORMAL LOW (ref >60)
Glucose, Bld: 399 mg/dL — ABNORMAL HIGH (ref 70–99)
Potassium: 3.9 mmol/L (ref 3.5–5.1)
Sodium: 131 mmol/L — ABNORMAL LOW (ref 135–145)
Total Bilirubin: 0.6 mg/dL (ref 0.3–1.2)
Total Protein: 7.8 g/dL (ref 6.5–8.1)

## 2018-10-11 LAB — AMMONIA: Ammonia: 30 umol/L (ref 9–35)

## 2018-10-11 LAB — CBC
HCT: 36.5 % — ABNORMAL LOW (ref 39.0–52.0)
Hemoglobin: 11.5 g/dL — ABNORMAL LOW (ref 13.0–17.0)
MCH: 26.1 pg (ref 26.0–34.0)
MCHC: 31.5 g/dL (ref 30.0–36.0)
MCV: 83 fL (ref 80.0–100.0)
Platelets: 221 10*3/uL (ref 150–400)
RBC: 4.4 MIL/uL (ref 4.22–5.81)
RDW: 13.2 % (ref 11.5–15.5)
WBC: 6 10*3/uL (ref 4.0–10.5)
nRBC: 0 % (ref 0.0–0.2)

## 2018-10-11 LAB — POCT I-STAT EG7
Bicarbonate: 23.3 mmol/L (ref 20.0–28.0)
Calcium, Ion: 1.19 mmol/L (ref 1.15–1.40)
HCT: 35 % — ABNORMAL LOW (ref 39.0–52.0)
Hemoglobin: 11.9 g/dL — ABNORMAL LOW (ref 13.0–17.0)
O2 Saturation: 93 %
Potassium: 3.9 mmol/L (ref 3.5–5.1)
Sodium: 133 mmol/L — ABNORMAL LOW (ref 135–145)
TCO2: 24 mmol/L (ref 22–32)
pCO2, Ven: 34 mmHg — ABNORMAL LOW (ref 44.0–60.0)
pH, Ven: 7.444 — ABNORMAL HIGH (ref 7.250–7.430)
pO2, Ven: 63 mmHg — ABNORMAL HIGH (ref 32.0–45.0)

## 2018-10-11 LAB — ETHANOL: Alcohol, Ethyl (B): <10 mg/dL (ref <10)

## 2018-10-11 LAB — TSH: TSH: 2.464 u[IU]/mL (ref 0.350–4.500)

## 2018-10-11 MED ORDER — INSULIN ASPART 100 UNIT/ML ~~LOC~~ SOLN
10.0000 [IU] | Freq: Once | SUBCUTANEOUS | Status: AC
  Administered 2018-10-11: 10 [IU] via SUBCUTANEOUS

## 2018-10-11 MED ORDER — SODIUM CHLORIDE 0.9 % IV BOLUS
1000.0000 mL | Freq: Once | INTRAVENOUS | Status: AC
  Administered 2018-10-11: 1000 mL via INTRAVENOUS

## 2018-10-11 MED ORDER — AMLODIPINE BESYLATE 5 MG PO TABS
10.0000 mg | ORAL_TABLET | Freq: Once | ORAL | Status: AC
  Administered 2018-10-11: 10 mg via ORAL
  Filled 2018-10-11: qty 2

## 2018-10-11 NOTE — Discharge Instructions (Addendum)
Pick up your insulin prescription as scheduled  Return for fever greater than 100, cough, vomiting, diarrhea, abdominal pain, stroke symptoms like difficulty with speech swallowing walking or balance, one sided weakness or drooping to face or extremities

## 2018-10-11 NOTE — ED Provider Notes (Signed)
1545: Hand off from previous EDPA at shift change. See previous note for full details. Briefly pt with h/o DM, HIV, BKA presents with generalized weakness and fatigue. Ran out of insulin causing hyperglycemia but no signs of diabetic crisis. One episode of diarrhea in ED. Was in ER parking lot for a few hours because he felt too weak to walk into ER.   Patient has been in the ER for approximately 8 hours.  Upon reevaluation by previous team he is still somewhat somnolent, drowsy.  So far lab work has been noncontributory.  Head CT, EtOH, UDS, TSH and VBG added. Physical Exam  BP (!) 161/109 (BP Location: Right Arm)   Pulse 65   Temp 98.1 F (36.7 C) (Oral)   Resp 11   Ht 5\' 9"  (1.753 m)   Wt 113.4 kg   SpO2 99%   BMI 36.92 kg/m   Physical Exam Constitutional:      Appearance: He is well-developed.  HENT:     Head: Normocephalic.     Nose: Nose normal.  Eyes:     General: Lids are normal.  Neck:     Musculoskeletal: Normal range of motion.  Cardiovascular:     Rate and Rhythm: Normal rate.  Pulmonary:     Effort: Pulmonary effort is normal. No respiratory distress.  Musculoskeletal: Normal range of motion.  Neurological:     Mental Status: He is alert.     Comments: Awake.  Alert to self, place, time and event. Spontaneously moves all four extremities in symmetric and coordinated fashion. Strength and sensation intact in upper/lower extremities.   Psychiatric:        Behavior: Behavior normal.     ED Course/Procedures   Clinical Course as of Oct 10 1936  Sun Oct 11, 2018  1713 Hemoglobin(!): 11.5 [CG]  1714 WBC: 6.0 [CG]  1714 Suspect from mild dehydration from hyperglycemia.  He has received 1 L IV fluid.  Creatinine(!): 1.68 [CG]  1714 No anion gap  Glucose(!): 399 [CG]  1715 IMPRESSION: 1. Very limited chest x-ray. 2. No obvious/gross acute cardiopulmonary findings.  DG Chest 2 View [CG]  1715 Incomplete analysis due to missing data in precordial lead(s) Sinus  rhythm Nonspecific intraventricular conduction delay Baseline wander in lead(s) V5 Missing lead(s): V4 When comapred to prior, similar t wave inversion in lead 3 to june ECG. No sTEMI Confirmed by Antony Blackbird [id number]) on 10/11/2018 12:42:06 PM  ED EKG [CG]  1751 Alcohol, Ethyl (B): <10 [CG]  1751 Clean   Rapid urine drug screen (hospital performed) [CG]    Clinical Course User Index [CG] Kinnie Feil, PA-C    Procedures  MDM   1630: ER work-up reviewed and remarkable as above.  Hyperglycemia with without signs of diabetic crisis.  UA without infection.  Chest x-ray without infection, edema.  COVID negative.  TSH WNL.  Ammonia WNL.  VBG unremarkable.    1715: I evaluated patient and he is alert, awake, fully oriented.  States he is here because he started feeling weak which she attributes to missing his insulin and high sugar levels.  States "I always got like this".  States that he can pick up his insulin tomorrow morning.  Denies any fever, cough, vomiting, diarrhea.  He adamantly denies any illicit drug use, EtOH use.  Pending head CT, UDS, EtOH.  UDS and ETOH normal. CT head normal. VSS. Tolerating PO. Ambulatory. Pt feels less tired, states he thinks it was because of his high  sugars. Given benign work up, no clinical decline, will dc with pcp f/u. He picks up insulin tomorrow. Return precautions given.      Kinnie Feil, PA-C 10/11/18 1939    Gareth Morgan, MD 10/16/18 [phone]

## 2018-10-11 NOTE — ED Notes (Signed)
Urinal at bedside. Patient is aware he needs a urine sample.

## 2018-10-11 NOTE — ED Notes (Signed)
Contacted lab and the ethanol and UDS will be added to prior blood sample and urine collection.

## 2018-10-11 NOTE — ED Provider Notes (Addendum)
[facility] EMERGENCY DEPARTMENT Provider Note   CSN: [id number] Arrival date & time: 10/11/18  0910    History   Chief Complaint Chief Complaint  Patient presents with  . Hyperglycemia    HPI Keith [name] is a 53 y.o. male with history of diabetes, HIV, right BKA who presents with generalized weakness and hyperglycemia.  Patient reports he has been out of his insulin.  He denies any pain, fever, shortness of breath, cough, chest pain, abdominal pain, nausea, vomiting, urinary symptoms.  He did have diarrhea in the ED just now.  No medications taken prior to arrival for symptoms.  Patient notes that he was in his car in the parking lot here since 5 AM, but was too weak to get to the door.     HPI  Past Medical History:  Diagnosis Date  . Abscess of right foot   . AIDS ([place])   . Chronic knee pain    right  . Chronic pain   . Diabetes type 2, uncontrolled ([place])    HgA1c 17.6 (04/27/2010)  . Diabetic foot ulcer ([place]) 01/2017   right foot  . Erectile dysfunction   . Genital warts   . HIV (human immunodeficiency virus infection) ([place]) 2009   CD4 count 100, VL 13800 (05/01/2010)  . Hypertension   . Osteomyelitis ([place])    h/o hand  . Osteomyelitis of metatarsal ([place]) 04/28/2017    Patient Active Problem List   Diagnosis Date Noted  . Type II diabetes mellitus with renal manifestations ([place]) 08/07/2018  . Uncontrolled type 2 diabetes mellitus with hyperosmolar nonketotic hyperglycemia ([place]) 08/07/2018  . Non-pressure chronic ulcer of right calf limited to breakdown of skin ([place]) 07/06/2018  . Type 2 diabetes mellitus without complication, without long-term current use of insulin ([place]) 06/15/2018  . Chronic low back pain without sciatica 06/15/2018  . Wound of left leg 06/15/2018  . Idiopathic chronic venous hypertension of left lower extremity with ulcer and inflammation ([place])   . Venous stasis ulcer of left calf limited to breakdown of skin without  varicose veins ([place]) 04/23/2018  . Acquired absence of right leg below knee ([place]) 06/13/2017  . S/P transmetatarsal amputation of foot, right ([place]) 05/07/2017  . Wound infection   . Bacteremia   . Fever   . Sepsis ([place]) 03/15/2017  . Acute renal failure superimposed on stage 3 chronic kidney disease ([place]) 03/15/2017  . Diabetic polyneuropathy associated with type 2 diabetes mellitus ([place]) 01/23/2017  . AKI (acute kidney injury) ([place]) 09/28/2016  . Nausea vomiting and diarrhea 09/28/2016  . Onychomycosis of multiple toenails with type 2 diabetes mellitus ([place]) 08/29/2015  . MRSA carrier 04/20/2014  . Testosterone deficiency 04/20/2014  . Penile wart 02/22/2014  . HIV disease ([place])   . Insulin-requiring or dependent type II diabetes mellitus ([place]) 02/04/2014  . Dental anomaly 11/20/2012  . Arthritis of right knee 02/23/2012  . Hyperlipidemia 11/10/2011  . Chronic pain 08/07/2011  . Meralgia paraesthetica 04/23/2011  . ERECTILE DYSFUNCTION 08/22/2008  . Essential hypertension 05/19/2008    Past Surgical History:  Procedure Laterality Date  . AMPUTATION Right 05/02/2017   Procedure: AMPUTATION TRANSMETARSAL;  Surgeon: [name], MD;  Location: [facility];  Service: Orthopedics;  Laterality: Right;  . AMPUTATION Right 06/13/2017   Procedure: RIGHT BELOW KNEE AMPUTATION;  Surgeon: [name], MD;  Location: [facility];  Service: Orthopedics;  Laterality: Right;  . BELOW KNEE LEG AMPUTATION Right 06/13/2017  . HERNIA REPAIR    .  I&D EXTREMITY Left 08/21/2014   Procedure: INCISION AND DRAINAGE LEFT SMALL FINGER;  Surgeon: Leanora Cover, MD;  Location: [facility];  Service: Orthopedics;  Laterality: Left;  . I&D EXTREMITY Right 03/18/2017   Procedure: IRRIGATION AND DEBRIDEMENT EXTREMITY;  Surgeon: [name], MD;  Location: [facility];  Service: Orthopedics;  Laterality: Right;  . MINOR IRRIGATION AND DEBRIDEMENT OF WOUND Right 04/22/2014   Procedure: IRRIGATION AND DEBRIDEMENT OF RIGHT NECK ABCESS;   Surgeon: Jerrell Belfast, MD;  Location: [facility];  Service: ENT;  Laterality: Right;  . MULTIPLE EXTRACTIONS WITH ALVEOLOPLASTY N/A 01/18/2013   Procedure: MULTIPLE EXTRACION 3, 6, 7, 10, 11, 13, 21, 22, 27, 28, 29, 30 WITH ALVEOLOPLASTY;  Surgeon: Gae Bon, DDS;  Location: [facility];  Service: Oral Surgery;  Laterality: N/A;  . SKIN SPLIT GRAFT Right 03/21/2017   Procedure: IRRIGATION AND DEBRIDEMENT RIGHT FOOT AND APPLY SPLIT THICKNESS SKIN GRAFT AND WOUND VAC;  Surgeon: [name], MD;  Location: [facility];  Service: Orthopedics;  Laterality: Right;  . TEE WITHOUT CARDIOVERSION N/A 05/02/2017   Procedure: TRANSESOPHAGEAL ECHOCARDIOGRAM (TEE);  Surgeon: Acie Fredrickson Wonda Cheng, MD;  [facility];  Service: Cardiovascular;  Laterality: N/A;  coincidental to orthopedic case        Home Medications    Prior to Admission medications   Medication Sig Start Date End Date Taking? Authorizing Provider  acetaminophen (TYLENOL) 325 MG tablet Take 2 tablets (650 mg total) by mouth every 6 (six) hours as needed. 08/08/18 08/08/19  Mercy Riding, MD  amLODipine (NORVASC) 10 MG tablet Take 1 tablet (10 mg total) by mouth daily. 07/09/18   [name], FNP  blood glucose meter kit and supplies Dispense based on patient and insurance preference. Use up to four times daily as directed. (FOR ICD-10 E10.9, E11.9). 07/31/18   [name], FNP  buprenorphine (BUTRANS) 20 MCG/HR PTWK patch Place 1 patch onto the skin. 08/14/18   [provider]  escitalopram (LEXAPRO) 20 MG tablet Take 20 mg by mouth daily. 07/15/18   [provider]  glipiZIDE (GLUCOTROL) 10 MG tablet Take 1 tablet (10 mg total) by mouth 2 (two) times daily before a meal. 09/14/18   [name], FNP  glucose blood (COOL BLOOD GLUCOSE TEST STRIPS) test strip Use as instructed 01/05/18   [name], FNP  Insulin Glargine (LANTUS SOLOSTAR) 100 UNIT/ML Solostar Pen Inject 30 Units into the skin daily. 09/14/18   [name], FNP  insulin lispro (HUMALOG) 100 UNIT/ML injection Inject 10 units under skin 3 times a day, with meals, for blood glucose levels greater than 125. 09/14/18 09/14/19  [name], FNP  Insulin Pen Needle (B-D UF III MINI PEN NEEDLES) 31G X 5 MM MISC 20 Units by Does not apply route as directed. 09/22/18   [name], FNP  Insulin Syringes, Disposable, U-100 0.3 ML MISC 1 Syringe by Does not apply route 3 (three) times daily. 09/22/18   [name], FNP  Lancets [facility] ULTRASOFT) lancets Use as instructed 01/05/18   [name], FNP  metFORMIN (GLUCOPHAGE) 1000 MG tablet Take 1 tablet (1,000 mg total) by mouth 2 (two) times daily with a meal. 09/14/18   [name], FNP  mupirocin ointment (BACTROBAN) 2 % Place 1 application into the nose 2 (two) times daily. 07/10/18   Rayburn, Neta Mends, PA-C  oxybutynin (DITROPAN) 5 MG tablet Take 1 tablet (5 mg total) by mouth 2 (two)  times daily. Patient not taking: Reported on 09/07/2018 08/30/18   [name], FNP  Oxycodone HCl 10 MG TABS Take 10 mg by mouth 4 (four) times daily. 07/09/18   [provider]  PREZCOBIX 800-150 MG tablet TAKE 1 TABLET BY MOUTH DAILY. SWALLOW WHOLE. DO NOT CRUSH, BREAK OR CHEW TABLETS. TAKE WITH FOOD. Patient taking differently: Take 1 tablet by mouth daily with breakfast.  07/13/18   [name], MD  tadalafil (ADCIRCA/CIALIS) 20 MG tablet Take 0.5-1 tablets (10-20 mg total) by mouth every other day as needed for erectile dysfunction. Patient not taking: Reported on 09/07/2018 11/05/17   [name], FNP  TRIUMEQ 600-50-300 MG tablet TAKE 1 TABLET BY MOUTH DAILY. Patient taking differently: Take 1 tablet by mouth daily.  07/13/18   [name], MD    Family History Family History  Problem Relation Age of Onset  . Hypertension Mother   . Arthritis Father   . Hypertension Father   . Hypertension Brother   . Cancer Maternal Grandmother 6       unknown type  of cancer  . Depression Paternal Grandmother     Social History Social History   Tobacco Use  . Smoking status: Never Smoker  . Smokeless tobacco: Never Used  Substance Use Topics  . Alcohol use: No    Alcohol/week: 0.0 standard drinks  . Drug use: Not Currently    Comment: OTC ASA     Allergies   Sulfur and Sulfa antibiotics   Review of Systems Review of Systems  Constitutional: Positive for fatigue. Negative for chills and fever.  HENT: Negative for facial swelling and sore throat.   Respiratory: Negative for shortness of breath.   Cardiovascular: Negative for chest pain.  Gastrointestinal: Positive for diarrhea. Negative for abdominal pain, nausea and vomiting.  Genitourinary: Negative for dysuria.  Musculoskeletal: Negative for back pain.  Skin: Negative for rash and wound.  Neurological: Positive for weakness. Negative for headaches.  Psychiatric/Behavioral: The patient is not nervous/anxious.      Physical Exam Updated Vital Signs BP (!) 143/85   Pulse 70   Temp 98.1 F (36.7 C) (Oral)   Resp 18   Ht '5\' 9"'$  (1.753 m)   Wt 113.4 kg   SpO2 98%   BMI 36.92 kg/m   Physical Exam Vitals signs and nursing note reviewed.  Constitutional:      General: He is not in acute distress.    Appearance: He is well-developed. He is ill-appearing. He is not diaphoretic.  HENT:     Head: Normocephalic and atraumatic.     Mouth/Throat:     Pharynx: No oropharyngeal exudate.  Eyes:     General: No scleral icterus.       Right eye: No discharge.        Left eye: No discharge.     Conjunctiva/sclera: Conjunctivae normal.     Pupils: Pupils are equal, round, and reactive to light.  Neck:     Musculoskeletal: Normal range of motion and neck supple.     Thyroid: No thyromegaly.  Cardiovascular:     Rate and Rhythm: Normal rate and regular rhythm.     Heart sounds: Normal heart sounds. No murmur. No friction rub. No gallop.   Pulmonary:     Effort: Pulmonary effort is  normal. No respiratory distress.     Breath sounds: Normal breath sounds. No stridor. No wheezing or rales.  Abdominal:     General: Bowel sounds are  normal. There is no distension.     Palpations: Abdomen is soft.     Tenderness: There is no abdominal tenderness. There is no guarding or rebound.  Lymphadenopathy:     Cervical: No cervical adenopathy.  Skin:    General: Skin is warm and dry.     Coloration: Skin is not pale.     Findings: No rash.  Neurological:     Mental Status: He is alert.     Coordination: Coordination normal.     Comments: CN 3-12 intact; normal sensation throughout; 5/5 strength in all 4 extremities; equal bilateral grip strength      ED Treatments / Results  Labs (all labs ordered are listed, but only abnormal results are displayed) Labs Reviewed  CBC - Abnormal; Notable for the following components:      Result Value   Hemoglobin 11.5 (*)    HCT 36.5 (*)    All other components within normal limits  URINALYSIS, ROUTINE W REFLEX MICROSCOPIC - Abnormal; Notable for the following components:   Color, Urine STRAW (*)    Glucose, UA >=500 (*)    Hgb urine dipstick SMALL (*)    Protein, ur 100 (*)    Leukocytes,Ua TRACE (*)    All other components within normal limits  COMPREHENSIVE METABOLIC PANEL - Abnormal; Notable for the following components:   Sodium 131 (*)    Glucose, Bld 399 (*)    Creatinine, Ser 1.68 (*)    Albumin 2.9 (*)    GFR calc non Af Amer 46 (*)    GFR calc Af Amer 53 (*)    All other components within normal limits  CBG MONITORING, ED - Abnormal; Notable for the following components:   Glucose-Capillary 382 (*)    All other components within normal limits  POCT I-STAT EG7 - Abnormal; Notable for the following components:   pH, Ven 7.444 (*)    pCO2, Ven 34.0 (*)    pO2, Ven 63.0 (*)    Sodium 133 (*)    HCT 35.0 (*)    Hemoglobin 11.9 (*)    All other components within normal limits  CBG MONITORING, ED - Abnormal; Notable  for the following components:   Glucose-Capillary 353 (*)    All other components within normal limits  CBG MONITORING, ED - Abnormal; Notable for the following components:   Glucose-Capillary 297 (*)    All other components within normal limits  SARS CORONAVIRUS 2 (HOSPITAL ORDER, [facility])  AMMONIA  TSH  ETHANOL  RAPID URINE DRUG SCREEN, HOSP PERFORMED  I-STAT VENOUS BLOOD GAS, ED    EKG EKG Interpretation  Date/Time:  Sunday October 11 2018 10:21:43 EDT Ventricular Rate:  73 PR Interval:    QRS Duration: 124 QT Interval:  387 QTC Calculation: 427 R Axis:   13 Text Interpretation:  Incomplete analysis due to missing data in precordial lead(s) Sinus rhythm Nonspecific intraventricular conduction delay Baseline wander in lead(s) V5 Missing lead(s): V4 When comapred to prior, similar t wave inversion in lead 3 to june ECG.  No sTEMI Confirmed by Antony Blackbird [phone]) on 10/11/2018 12:42:06 PM   Radiology No results found.  Procedures Procedures (including critical care time)  Medications Ordered in ED Medications  sodium chloride 0.9 % bolus 1,000 mL (0 mLs Intravenous Stopped 10/11/18 1312)  amLODipine (NORVASC) tablet 10 mg (10 mg Oral Given 10/11/18 1316)  insulin aspart (novoLOG) injection 10 Units (10 Units Subcutaneous Given 10/11/18 1318)  Initial Impression / Assessment and Plan / ED Course  I have reviewed the triage vital signs and the nursing notes.  Pertinent labs & imaging results that were available during my care of the patient were reviewed by me and considered in my medical decision making (see chart for details).  Clinical Course as of Oct 14 1611  Sun Oct 11, 2018  1713 Hemoglobin(!): 11.5 [CG]  1714 WBC: 6.0 [CG]  1714 Suspect from mild dehydration from hyperglycemia.  He has received 1 L IV fluid.  Creatinine(!): 1.68 [CG]  1714 No anion gap  Glucose(!): 399 [CG]  1715 IMPRESSION: 1. Very limited chest x-ray. 2. No  obvious/gross acute cardiopulmonary findings.  DG Chest 2 View [CG]  1715 Incomplete analysis due to missing data in precordial lead(s) Sinus rhythm Nonspecific intraventricular conduction delay Baseline wander in lead(s) V5 Missing lead(s): V4 When comapred to prior, similar t wave inversion in lead 3 to june ECG. No sTEMI Confirmed by Antony Blackbird [phone]) on 10/11/2018 12:42:06 PM  ED EKG [CG]  1751 Alcohol, Ethyl (B): <10 [CG]  1751 Clean   Rapid urine drug screen (hospital performed) [CG]    Clinical Course User Index [CG] Kinnie Feil, PA-C       Patient presenting with generalized fatigue and weakness.  He ran out of his insulin 2 days ago.  His glucose is elevated to the upper 300s, but there is no signs of DKA or HHS. Fluids and subQ insulin given. COVID-19 negative.  At shift change, ammonia, TSH, chest x-ray, CT head, UDS pending and care transferred to Carmon Sails, PA-C who will follow-up on the above results and reevaluate to determine disposition. I discussed patient case with Dr. Sherry Ruffing who guided the patient's management and agrees with plan.   Final Clinical Impressions(s) / ED Diagnoses   Final diagnoses:  Hyperglycemia  Other fatigue  Generalized weakness    ED Discharge Orders    None       Frederica Kuster, PA-C 10/11/18 1729    Frederica Kuster, PA-C 10/14/18 1613    Tegeler, Gwenyth Allegra, MD 10/15/18 [phone]

## 2018-10-11 NOTE — ED Notes (Addendum)
Called CT and there are 6 patients ahead of this patient at this time.

## 2018-10-11 NOTE — Care Management (Signed)
ED CM reviewed patient's record, patient is being followed by [facility]. CM made patient aware that the clinic has been trying to reach him concerning his medications. Also reminded him of his scheduled  Appointment tomorrow Monday 7/6 at 8am at the clinic patient acknowledged knowing. Information was also placed on AVS. No further ED CM needs identified.

## 2018-10-11 NOTE — ED Triage Notes (Signed)
Pt states he ran out of his insulin on Friday, and is due to see his MD on Monday.  His cbg on Friday was 400.  He states he has been sitting in his car in the parking lot since 5 am this morning, because he was too weak to get out of his car.

## 2018-10-12 ENCOUNTER — Ambulatory Visit: Payer: Medicare Other | Admitting: Family Medicine

## 2018-10-15 ENCOUNTER — Other Ambulatory Visit: Payer: Self-pay

## 2018-10-15 ENCOUNTER — Ambulatory Visit (INDEPENDENT_AMBULATORY_CARE_PROVIDER_SITE_OTHER): Payer: Medicare Other | Admitting: Infectious Diseases

## 2018-10-15 ENCOUNTER — Encounter: Payer: Self-pay | Admitting: Infectious Diseases

## 2018-10-15 VITALS — BP 182/109 | HR 72 | Ht 69.0 in | Wt 255.0 lb

## 2018-10-15 DIAGNOSIS — I10 Essential (primary) hypertension: Secondary | ICD-10-CM

## 2018-10-15 DIAGNOSIS — N183 Chronic kidney disease, stage 3 (moderate): Secondary | ICD-10-CM | POA: Diagnosis not present

## 2018-10-15 DIAGNOSIS — E1122 Type 2 diabetes mellitus with diabetic chronic kidney disease: Secondary | ICD-10-CM

## 2018-10-15 DIAGNOSIS — S81802D Unspecified open wound, left lower leg, subsequent encounter: Secondary | ICD-10-CM

## 2018-10-15 DIAGNOSIS — N179 Acute kidney failure, unspecified: Secondary | ICD-10-CM

## 2018-10-15 DIAGNOSIS — Z794 Long term (current) use of insulin: Secondary | ICD-10-CM

## 2018-10-15 DIAGNOSIS — B20 Human immunodeficiency virus [HIV] disease: Secondary | ICD-10-CM

## 2018-10-15 DIAGNOSIS — Z23 Encounter for immunization: Secondary | ICD-10-CM | POA: Diagnosis not present

## 2018-10-15 MED ORDER — BICTEGRAVIR-EMTRICITAB-TENOFOV 50-200-25 MG PO TABS: 1.0000 | ORAL_TABLET | Freq: Every day | ORAL | 3 refills | Status: DC

## 2018-10-15 NOTE — Assessment & Plan Note (Signed)
Markedly elevated today.  He is worried he will need HD.  Needs better control for sure.  Swears adherence.

## 2018-10-15 NOTE — Addendum Note (Signed)
Addended by: Landis Gandy on: 10/15/2018 04:42 PM   Modules accepted: Orders

## 2018-10-15 NOTE — Assessment & Plan Note (Signed)
Healed, scared, will mark as resolved.

## 2018-10-15 NOTE — Assessment & Plan Note (Signed)
His Cr is improved on recent lab.  Will get him in with renal.

## 2018-10-15 NOTE — Assessment & Plan Note (Signed)
Appreciate PCP f/u Will get him in with podiatry.  Will get him in with renal.  Will get him in with ophtho.

## 2018-10-15 NOTE — Assessment & Plan Note (Signed)
Will change his ART to biktarvy (recent studies showing efficacy in M184V hx).  Should be renal sparing.  He asks for help with housing, needs to f/u with [facility].  Needs PCV13 Will see him back in 2 months

## 2018-10-15 NOTE — Progress Notes (Signed)
   Subjective:    [name], male    DOB: [birth year], 53 y.o.   MRN: [id number]  HPI 53 y.o.M HIV+, uncontrolled DM2, HTN, stage III CKD, and chronic right foot osteomyelitis who presented to the ED 04/28/17 with worsening distal plantar pain and fever/chills concerning for worsening wound infection/osteomyelitis despite outpatient augmentin. He had right trans-met amputation 05/02/2017. Blood cultures grew MRSA PICC line was placed 1/29 and he completed vancomycin to 2/7.  On 06-13-17 he underwent trans-tibial amputation after developing an abscess and wound dehisc in his R foot.  He was in hospital 08-07-2018 with HHS and AKI (CBG 395 and Anion Gap 11).  He is currently on prezcobix and triumeq (he had genotype 05-2018 showing background mutations, geno 11-2009 showing no mutations, 2010 M184V).   He feels like his ART is making his renal function up. He says his PCP told him to stop his ART.  He is trying to get into renal.  Needs to get "operation for my penis (circumcision)".  My sugars have been "good" 130-125  HIV 1 RNA Quant (copies/mL)  Date Value  09/17/2017 <20 NOT DETECTED  01/14/2017 <20  05/13/2016 <20 DETECTED (A)   CD4 T Cell Abs (/uL)  Date Value  06/06/2018 140 (L)  04/28/2017 80 (L)  05/13/2016 210 (L)    Review of Systems  Constitutional: Positive for unexpected weight change. Negative for appetite change, chills and fever.  Eyes: Negative for visual disturbance.  Respiratory: Negative for cough and shortness of breath.   Gastrointestinal: Negative for diarrhea and nausea.  Genitourinary: Positive for discharge. Negative for difficulty urinating.  Neurological: Positive for numbness.  urine clear.  Has started exercising with a trainer.     Objective:   Physical Exam Constitutional:      Appearance: Normal appearance.  HENT:     Head: Normocephalic and atraumatic.     Mouth/Throat:     Mouth: Mucous membranes are moist.     Pharynx: No  oropharyngeal exudate.  Eyes:     Extraocular Movements: Extraocular movements intact.     Pupils: Pupils are equal, round, and reactive to light.  Neck:     Musculoskeletal: Normal range of motion and neck supple.  Cardiovascular:     Rate and Rhythm: Normal rate and regular rhythm.  Pulmonary:     Effort: Pulmonary effort is normal.     Breath sounds: Normal breath sounds.  Abdominal:     General: Bowel sounds are normal. There is no distension.     Palpations: Abdomen is soft.     Tenderness: There is no abdominal tenderness.  Musculoskeletal:       Legs:  Neurological:     Mental Status: He is alert.           Assessment & Plan:

## 2018-10-19 ENCOUNTER — Emergency Department (HOSPITAL_COMMUNITY)
Admission: EM | Admit: 2018-10-19 | Discharge: 2018-10-19 | Disposition: A | Discharge status: Home / Self Care | Payer: Medicare Other | Attending: Emergency Medicine | Admitting: Emergency Medicine

## 2018-10-19 ENCOUNTER — Other Ambulatory Visit: Payer: Self-pay

## 2018-10-19 ENCOUNTER — Encounter (HOSPITAL_COMMUNITY): Payer: Self-pay | Admitting: *Deleted

## 2018-10-19 ENCOUNTER — Other Ambulatory Visit: Payer: Self-pay | Admitting: Family Medicine

## 2018-10-19 DIAGNOSIS — M79604 Pain in right leg: Secondary | ICD-10-CM | POA: Diagnosis not present

## 2018-10-19 DIAGNOSIS — E119 Type 2 diabetes mellitus without complications: Secondary | ICD-10-CM | POA: Insufficient documentation

## 2018-10-19 DIAGNOSIS — R350 Frequency of micturition: Secondary | ICD-10-CM

## 2018-10-19 DIAGNOSIS — M545 Low back pain: Secondary | ICD-10-CM | POA: Diagnosis not present

## 2018-10-19 DIAGNOSIS — Z79899 Other long term (current) drug therapy: Secondary | ICD-10-CM | POA: Diagnosis not present

## 2018-10-19 MED ORDER — OXYCODONE-ACETAMINOPHEN 5-325 MG PO TABS
2.0000 | ORAL_TABLET | Freq: Once | ORAL | Status: AC
  Administered 2018-10-19: 2 via ORAL
  Filled 2018-10-19: qty 2

## 2018-10-19 NOTE — ED Provider Notes (Signed)
**Note Keith [name]** [facility] EMERGENCY DEPARTMENT Provider Note   CSN: [id number] Arrival date & time: 10/19/18  3299     History   Chief Complaint Chief Complaint  Patient presents with  . Leg Pain    HPI Keith [name] is a 53 y.o. male.  HPI   52yM with RLE pain. S/p R BKA. Says he was wearing prosthesis when it slipped and he has been having pain at his stump. No breaks in skin. No swelling. Just hurts. Says he has had phantom limb pain previously, but this is not that.   Past Medical History:  Diagnosis Date  . Diabetes mellitus without complication ([place])    There are no active problems to display for this patient.  Past Surgical History:  Procedure Laterality Date  . BELOW KNEE LEG AMPUTATION        Home Medications    Prior to Admission medications   Not on File    Family History No family history on file.  Social History Social History   Tobacco Use  . Smoking status: Never Smoker  . Smokeless tobacco: Never Used  Substance Use Topics  . Alcohol use: Never    Frequency: Never  . Drug use: Never    Allergies   Patient has no allergy information on record.   Review of Systems Review of Systems  All systems reviewed and negative, other than as noted in HPI.  Physical Exam Updated Vital Signs BP 136/81 (BP Location: Right Arm)   Pulse 82   Temp 98.3 F (36.8 C) (Oral)   Resp 18   Ht 5\' 9"  (1.753 m)   Wt 113.4 kg   SpO2 99%   BMI 36.92 kg/m   Physical Exam Vitals signs and nursing note reviewed.  Constitutional:      General: He is not in acute distress.    Appearance: He is well-developed.  HENT:     Head: Normocephalic and atraumatic.  Eyes:     General:        Right eye: No discharge.        Left eye: No discharge.     Conjunctiva/sclera: Conjunctivae normal.  Neck:     Musculoskeletal: Neck supple.  Cardiovascular:     Rate and Rhythm: Normal rate and regular rhythm.     Heart sounds: Normal heart sounds. No murmur.  No friction rub. No gallop.   Pulmonary:     Effort: Pulmonary effort is normal. No respiratory distress.     Breath sounds: Normal breath sounds.  Abdominal:     General: There is no distension.     Palpations: Abdomen is soft.     Tenderness: There is no abdominal tenderness.  Musculoskeletal:        General: Tenderness present.     Comments: Right BKA.  There is some tenderness to palpation along the stump but it otherwise appears to be fairly normal in appearance.  Skin:    General: Skin is warm and dry.  Neurological:     Mental Status: He is alert.  Psychiatric:        Behavior: Behavior normal.        Thought Content: Thought content normal.      ED Treatments / Results  Labs (all labs ordered are listed, but only abnormal results are displayed) Labs Reviewed - No data to display  EKG None  Radiology No results found.  Procedures Procedures (including critical care time)  Medications Ordered in ED Medications  oxyCODONE-acetaminophen (PERCOCET/ROXICET) 5-325 MG per tablet 2 tablet (2 tablets Oral Given 10/19/18 0725)     Initial Impression / Assessment and Plan / ED Course  I have reviewed the triage vital signs and the nursing notes.  Pertinent labs & imaging results that were available during my care of the patient were reviewed by me and considered in my medical decision making (see chart for details).   53 year old male with pain in the stump of his right BKA.  Likely contusion.  I doubt fracture, infection, DVT, etc.  Plan symptomatic treatment.  Activity as tolerated.  Follow-up as previously scheduled with PCP later today.  Final Clinical Impressions(s) / ED Diagnoses   Final diagnoses:  Right leg pain    ED Discharge Orders    None       Virgel Manifold, MD 10/21/18 1020

## 2018-10-19 NOTE — Discharge Instructions (Addendum)
Follow-up this afternoon as scheduled.

## 2018-10-19 NOTE — ED Triage Notes (Signed)
C/o right leg pain states he has a prosthetic leg. Onset pain Sat. States he ran out of his pain medication , states his scheduled to see his PCP this afternoon.

## 2018-10-21 ENCOUNTER — Other Ambulatory Visit: Payer: Self-pay

## 2018-10-21 ENCOUNTER — Ambulatory Visit
Admission: RE | Admit: 2018-10-21 | Discharge: 2018-10-21 | Disposition: A | Discharge status: Home / Self Care | Payer: Medicare Other | Source: Ambulatory Visit | Attending: Infectious Diseases | Admitting: Infectious Diseases

## 2018-10-21 DIAGNOSIS — N183 Chronic kidney disease, stage 3 (moderate): Secondary | ICD-10-CM

## 2018-10-21 DIAGNOSIS — N189 Chronic kidney disease, unspecified: Secondary | ICD-10-CM | POA: Diagnosis not present

## 2018-10-21 DIAGNOSIS — N179 Acute kidney failure, unspecified: Secondary | ICD-10-CM

## 2018-10-22 ENCOUNTER — Encounter: Payer: Self-pay | Admitting: Orthopaedic Surgery

## 2018-10-22 ENCOUNTER — Encounter: Payer: Self-pay | Admitting: Infectious Diseases

## 2018-10-22 ENCOUNTER — Ambulatory Visit (INDEPENDENT_AMBULATORY_CARE_PROVIDER_SITE_OTHER): Payer: Medicare Other | Admitting: Orthopaedic Surgery

## 2018-10-22 ENCOUNTER — Other Ambulatory Visit: Payer: Self-pay

## 2018-10-22 DIAGNOSIS — Z89511 Acquired absence of right leg below knee: Secondary | ICD-10-CM

## 2018-10-22 MED ORDER — MUPIROCIN 2 % EX OINT: 1.0000 "application " | TOPICAL_OINTMENT | Freq: Two times a day (BID) | CUTANEOUS | 0 refills | Status: DC

## 2018-10-22 MED ORDER — DOXYCYCLINE MONOHYDRATE 100 MG PO TABS: 100.0000 mg | ORAL_TABLET | Freq: Two times a day (BID) | ORAL | 0 refills | Status: DC

## 2018-10-22 NOTE — Progress Notes (Signed)
Office Visit Note   Patient: Keith [name]           Date of Birth: Apr 14, 1965           MRN: [id number] Visit Date: 10/22/2018              Requested by: No referring provider defined for this encounter. PCP: System, Pcp Not In   Assessment & Plan: Visit Diagnoses:  1. Hx of right BKA ([place])     Plan: Impression is wound to the right distal BKA stump.  The wound is only limited to the skin.  We will apply mupirocin ointment today.  He will do this twice daily.  We will start him on doxycycline 100 mg twice daily for 2 weeks.  We have advised him to not wear his current socket unless for transfers.  We have provided him with a prescription for a new socket.  He will follow-up with [name] or Junie Panning in 2 weeks time for wound check.  Follow-Up Instructions: Return in about 2 weeks (around 11/05/2018) for for wound check with [name] or Junie Panning.   Orders:  No orders of the defined types were placed in this encounter.  Meds ordered this encounter  Medications  . doxycycline (ADOXA) 100 MG tablet    Sig: Take 1 tablet (100 mg total) by mouth 2 (two) times daily.    Dispense:  28 tablet    Refill:  0  . mupirocin ointment (BACTROBAN) 2 %    Sig: Place 1 application into the nose 2 (two) times daily.    Dispense:  22 g    Refill:  0      Procedures: No procedures performed   Clinical Data: No additional findings.   Subjective: No chief complaint on file.   HPI patient is a 53 year old gentleman who comes in today following an injury to his right lower extremity.  He has status post right below the knee amputation by [name] 1 year ago.  He has been wearing a socket which is entirely too big.  He notes that he came out of the socket and fell landing on the distal stump.  He was seen in the ED for this.  He comes in today for further evaluation and treatment recommendation.  He is having pain to the distal stump.  He is also noticed an open area which is draining slightly.   Review of Systems as detailed in HPI.  All others reviewed and are negative.   Objective: Vital Signs: There were no vitals taken for this visit.  Physical Exam well-developed and well-nourished gentleman no acute distress.  Alert and oriented x3  Ortho Exam examination of his right lower extremity reveals a small open area about the size of a nickel to the distal stump.  There is slight drainage.  No evidence of infection.  Specialty Comments:  No specialty comments available.  Imaging: No new imaging   PMFS History: There are no active problems to display for this patient.  Past Medical History:  Diagnosis Date  . Diabetes mellitus without complication ([place])     History reviewed. No pertinent family history.  Past Surgical History:  Procedure Laterality Date  . BELOW KNEE LEG AMPUTATION     Social History   Occupational History  . Not on file  Tobacco Use  . Smoking status: Never Smoker  . Smokeless tobacco: Never Used  Substance and Sexual Activity  . Alcohol use: Never  Frequency: Never  . Drug use: Never  . Sexual activity: Not on file

## 2018-10-23 ENCOUNTER — Other Ambulatory Visit: Payer: Self-pay | Admitting: Infectious Diseases

## 2018-10-23 DIAGNOSIS — N183 Chronic kidney disease, stage 3 (moderate): Secondary | ICD-10-CM

## 2018-10-23 DIAGNOSIS — I10 Essential (primary) hypertension: Secondary | ICD-10-CM

## 2018-10-23 DIAGNOSIS — N179 Acute kidney failure, unspecified: Secondary | ICD-10-CM

## 2018-10-25 ENCOUNTER — Encounter (HOSPITAL_COMMUNITY): Payer: Self-pay | Admitting: Emergency Medicine

## 2018-10-25 ENCOUNTER — Emergency Department (HOSPITAL_COMMUNITY)
Admission: EM | Admit: 2018-10-25 | Discharge: 2018-10-25 | Disposition: A | Discharge status: Home / Self Care | Payer: Medicare Other | Source: Home / Self Care | Attending: Emergency Medicine | Admitting: Emergency Medicine

## 2018-10-25 ENCOUNTER — Other Ambulatory Visit: Payer: Self-pay

## 2018-10-25 ENCOUNTER — Emergency Department (HOSPITAL_COMMUNITY): Payer: Medicare Other

## 2018-10-25 DIAGNOSIS — R51 Headache: Secondary | ICD-10-CM | POA: Insufficient documentation

## 2018-10-25 DIAGNOSIS — Y939 Activity, unspecified: Secondary | ICD-10-CM | POA: Insufficient documentation

## 2018-10-25 DIAGNOSIS — W260XXA Contact with knife, initial encounter: Secondary | ICD-10-CM | POA: Insufficient documentation

## 2018-10-25 DIAGNOSIS — R509 Fever, unspecified: Secondary | ICD-10-CM | POA: Insufficient documentation

## 2018-10-25 DIAGNOSIS — S60943A Unspecified superficial injury of left middle finger, initial encounter: Secondary | ICD-10-CM

## 2018-10-25 DIAGNOSIS — R739 Hyperglycemia, unspecified: Secondary | ICD-10-CM

## 2018-10-25 DIAGNOSIS — E1165 Type 2 diabetes mellitus with hyperglycemia: Secondary | ICD-10-CM | POA: Diagnosis not present

## 2018-10-25 DIAGNOSIS — R05 Cough: Secondary | ICD-10-CM | POA: Insufficient documentation

## 2018-10-25 DIAGNOSIS — B20 Human immunodeficiency virus [HIV] disease: Secondary | ICD-10-CM | POA: Diagnosis not present

## 2018-10-25 DIAGNOSIS — Z03818 Encounter for observation for suspected exposure to other biological agents ruled out: Secondary | ICD-10-CM | POA: Diagnosis not present

## 2018-10-25 DIAGNOSIS — E871 Hypo-osmolality and hyponatremia: Secondary | ICD-10-CM

## 2018-10-25 DIAGNOSIS — S61203A Unspecified open wound of left middle finger without damage to nail, initial encounter: Secondary | ICD-10-CM | POA: Diagnosis not present

## 2018-10-25 DIAGNOSIS — Z79899 Other long term (current) drug therapy: Secondary | ICD-10-CM | POA: Insufficient documentation

## 2018-10-25 DIAGNOSIS — L089 Local infection of the skin and subcutaneous tissue, unspecified: Secondary | ICD-10-CM

## 2018-10-25 DIAGNOSIS — I10 Essential (primary) hypertension: Secondary | ICD-10-CM | POA: Insufficient documentation

## 2018-10-25 DIAGNOSIS — Y999 Unspecified external cause status: Secondary | ICD-10-CM | POA: Insufficient documentation

## 2018-10-25 DIAGNOSIS — T8743 Infection of amputation stump, right lower extremity: Secondary | ICD-10-CM | POA: Diagnosis not present

## 2018-10-25 DIAGNOSIS — L03115 Cellulitis of right lower limb: Secondary | ICD-10-CM | POA: Diagnosis not present

## 2018-10-25 DIAGNOSIS — Z20828 Contact with and (suspected) exposure to other viral communicable diseases: Secondary | ICD-10-CM | POA: Insufficient documentation

## 2018-10-25 DIAGNOSIS — Z794 Long term (current) use of insulin: Secondary | ICD-10-CM | POA: Insufficient documentation

## 2018-10-25 DIAGNOSIS — Y929 Unspecified place or not applicable: Secondary | ICD-10-CM | POA: Insufficient documentation

## 2018-10-25 DIAGNOSIS — S61213A Laceration without foreign body of left middle finger without damage to nail, initial encounter: Secondary | ICD-10-CM | POA: Insufficient documentation

## 2018-10-25 DIAGNOSIS — N179 Acute kidney failure, unspecified: Secondary | ICD-10-CM | POA: Diagnosis not present

## 2018-10-25 LAB — CBC WITH DIFFERENTIAL/PLATELET
Abs Immature Granulocytes: 0.05 10*3/uL (ref 0.00–0.07)
Basophils Absolute: 0 10*3/uL (ref 0.0–0.1)
Basophils Relative: 0 %
Eosinophils Absolute: 0.1 10*3/uL (ref 0.0–0.5)
Eosinophils Relative: 1 %
HCT: 34.6 % — ABNORMAL LOW (ref 39.0–52.0)
Hemoglobin: 10.8 g/dL — ABNORMAL LOW (ref 13.0–17.0)
Immature Granulocytes: 1 %
Lymphocytes Relative: 20 %
Lymphs Abs: 2 10*3/uL (ref 0.7–4.0)
MCH: 26 pg (ref 26.0–34.0)
MCHC: 31.2 g/dL (ref 30.0–36.0)
MCV: 83.4 fL (ref 80.0–100.0)
Monocytes Absolute: 0.7 10*3/uL (ref 0.1–1.0)
Monocytes Relative: 6 %
Neutro Abs: 7.5 10*3/uL (ref 1.7–7.7)
Neutrophils Relative %: 72 %
Platelets: 233 10*3/uL (ref 150–400)
RBC: 4.15 MIL/uL — ABNORMAL LOW (ref 4.22–5.81)
RDW: 12.6 % (ref 11.5–15.5)
WBC: 10.3 10*3/uL (ref 4.0–10.5)
nRBC: 0 % (ref 0.0–0.2)

## 2018-10-25 LAB — BASIC METABOLIC PANEL
Anion gap: 8 (ref 5–15)
BUN: 23 mg/dL — ABNORMAL HIGH (ref 6–20)
CO2: 23 mmol/L (ref 22–32)
Calcium: 8.5 mg/dL — ABNORMAL LOW (ref 8.9–10.3)
Chloride: 98 mmol/L (ref 98–111)
Creatinine, Ser: 1.85 mg/dL — ABNORMAL HIGH (ref 0.61–1.24)
GFR calc Af Amer: 47 mL/min — ABNORMAL LOW (ref >60)
GFR calc non Af Amer: 41 mL/min — ABNORMAL LOW (ref >60)
Glucose, Bld: 427 mg/dL — ABNORMAL HIGH (ref 70–99)
Potassium: 3.9 mmol/L (ref 3.5–5.1)
Sodium: 129 mmol/L — ABNORMAL LOW (ref 135–145)

## 2018-10-25 LAB — COMPREHENSIVE METABOLIC PANEL
ALT: 13 U/L (ref 0–44)
AST: 18 U/L (ref 15–41)
Albumin: 2.8 g/dL — ABNORMAL LOW (ref 3.5–5.0)
Alkaline Phosphatase: 121 U/L (ref 38–126)
Anion gap: 10 (ref 5–15)
BUN: 24 mg/dL — ABNORMAL HIGH (ref 6–20)
CO2: 23 mmol/L (ref 22–32)
Calcium: 8.7 mg/dL — ABNORMAL LOW (ref 8.9–10.3)
Chloride: 91 mmol/L — ABNORMAL LOW (ref 98–111)
Creatinine, Ser: 2.05 mg/dL — ABNORMAL HIGH (ref 0.61–1.24)
GFR calc Af Amer: 42 mL/min — ABNORMAL LOW (ref >60)
GFR calc non Af Amer: 36 mL/min — ABNORMAL LOW (ref >60)
Glucose, Bld: 475 mg/dL — ABNORMAL HIGH (ref 70–99)
Potassium: 3.8 mmol/L (ref 3.5–5.1)
Sodium: 124 mmol/L — ABNORMAL LOW (ref 135–145)
Total Bilirubin: 0.6 mg/dL (ref 0.3–1.2)
Total Protein: 8.3 g/dL — ABNORMAL HIGH (ref 6.5–8.1)

## 2018-10-25 LAB — URINALYSIS, ROUTINE W REFLEX MICROSCOPIC
Bacteria, UA: NONE SEEN
Bilirubin Urine: NEGATIVE
Glucose, UA: >=500 mg/dL — AB
Ketones, ur: NEGATIVE mg/dL
Leukocytes,Ua: NEGATIVE
Nitrite: NEGATIVE
Protein, ur: 100 mg/dL — AB
Specific Gravity, Urine: 1.017 (ref 1.005–1.030)
pH: 5 (ref 5.0–8.0)

## 2018-10-25 LAB — SARS CORONAVIRUS 2 BY RT PCR (HOSPITAL ORDER, PERFORMED IN ~~LOC~~ HOSPITAL LAB): SARS Coronavirus 2: NEGATIVE

## 2018-10-25 LAB — LACTIC ACID, PLASMA: Lactic Acid, Venous: 1.3 mmol/L (ref 0.5–1.9)

## 2018-10-25 MED ORDER — CEFAZOLIN SODIUM-DEXTROSE 1-4 GM/50ML-% IV SOLN
1.0000 g | Freq: Once | INTRAVENOUS | Status: AC
  Administered 2018-10-25: 1 g via INTRAVENOUS
  Filled 2018-10-25: qty 50

## 2018-10-25 MED ORDER — CEPHALEXIN 250 MG PO CAPS: 250.0000 mg | ORAL_CAPSULE | Freq: Four times a day (QID) | ORAL | 0 refills | Status: DC

## 2018-10-25 MED ORDER — SODIUM CHLORIDE 0.9 % IV BOLUS
1000.0000 mL | Freq: Once | INTRAVENOUS | Status: AC
  Administered 2018-10-25: 1000 mL via INTRAVENOUS

## 2018-10-25 MED ORDER — ACETAMINOPHEN 325 MG PO TABS
650.0000 mg | ORAL_TABLET | Freq: Once | ORAL | Status: AC
  Administered 2018-10-25: 650 mg via ORAL
  Filled 2018-10-25: qty 2

## 2018-10-25 NOTE — Discharge Instructions (Signed)
You were seen in the emergency department for an injury to your left middle finger with signs of infection.  You also did not feel well with headache and high blood sugar.  You received some fluids and antibiotics with improvement in your symptoms.  Will be important for you to finish taking antibiotics and follow-up with your doctor.  Please return if any worsening symptoms.

## 2018-10-25 NOTE — ED Notes (Signed)
Pt given dc instructions and pt verbalizes understanding. Pt wheeled out to lobby,

## 2018-10-25 NOTE — ED Notes (Signed)
Pt refused Covid Test - states that he had one. Explained to pt that his test was more than a week ago and he has a constant, dry cough with a fever and we need to re-check. Pt said "No".

## 2018-10-25 NOTE — ED Triage Notes (Signed)
Pt c/o headache and cough x 2 weeks. Denies sick contacts. Also c/o wound to left middle finger. 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 Hx diabetes. Reports blood sugars have been controlled.

## 2018-10-25 NOTE — ED Provider Notes (Signed)
[facility] EMERGENCY DEPARTMENT Provider Note   CSN: [id number] Arrival date & time: 10/25/18  0348     History   Chief Complaint Chief Complaint  Patient presents with   Headache   Wound Check    HPI Keith [name] is a 53 y.o. male.  He has a history of HIV and diabetes.  He is here with a complaint of right little finger pain is been going on for 3 or 4 days.  He said it was swollen and yesterday he took a knife to it and cut into it which drained some pus and blood.  He is also had a nonproductive cough for few days.  He is also complaining of some headache and body aches.  He had a fever on arrival here.  Patient said he was Covid -3 weeks ago.     The history is provided by the patient.  Hand Pain This is a new problem. The current episode started more than 2 days ago. The problem occurs constantly. The problem has not changed since onset.Associated symptoms include headaches. Pertinent negatives include no chest pain, no abdominal pain and no shortness of breath. The symptoms are aggravated by bending. Nothing relieves the symptoms. He has tried nothing for the symptoms. The treatment provided no relief.    Past Medical History:  Diagnosis Date   Abscess of right foot    AIDS ([place])    Chronic knee pain    right   Chronic pain    Diabetes mellitus without complication ([place])    Diabetes type 2, uncontrolled ([place])    HgA1c 17.6 (04/27/2010)   Diabetic foot ulcer ([place]) 01/2017   right foot   Erectile dysfunction    Genital warts    HIV (human immunodeficiency virus infection) ([place]) 2009   CD4 count 100, VL 13800 (05/01/2010)   Hypertension    Osteomyelitis ([place])    h/o hand   Osteomyelitis of metatarsal ([place]) 04/28/2017    Patient Active Problem List   Diagnosis Date Noted   Type II diabetes mellitus with renal manifestations ([place]) 08/07/2018   Uncontrolled type 2 diabetes mellitus with hyperosmolar nonketotic hyperglycemia  ([place]) 08/07/2018   Non-pressure chronic ulcer of right calf limited to breakdown of skin ([place]) 07/06/2018   Type 2 diabetes mellitus without complication, without long-term current use of insulin ([place]) 06/15/2018   Chronic low back pain without sciatica 06/15/2018   Wound of left leg 06/15/2018   Idiopathic chronic venous hypertension of left lower extremity with ulcer and inflammation (HCC)    Venous stasis ulcer of left calf limited to breakdown of skin without varicose veins ([place]) 04/23/2018   Acquired absence of right leg below knee ([place]) 06/13/2017   Acute renal failure superimposed on stage 3 chronic kidney disease ([place]) 03/15/2017   Diabetic polyneuropathy associated with type 2 diabetes mellitus ([place]) 01/23/2017   AKI (acute kidney injury) ([place]) 09/28/2016   Nausea vomiting and diarrhea 09/28/2016   Onychomycosis of multiple toenails with type 2 diabetes mellitus ([place]) 08/29/2015   MRSA carrier 04/20/2014   Penile wart 02/22/2014   HIV disease ([place])    Insulin-requiring or dependent type II diabetes mellitus ([place]) 02/04/2014   Dental anomaly 11/20/2012   Arthritis of right knee 02/23/2012   Hyperlipidemia 11/10/2011   Chronic pain 08/07/2011   Meralgia paraesthetica 04/23/2011   ERECTILE DYSFUNCTION 08/22/2008   Essential hypertension 05/19/2008    Past Surgical History:  Procedure Laterality Date   AMPUTATION Right 05/02/2017  Procedure: AMPUTATION TRANSMETARSAL;  Surgeon: [name], MD;  Location: [place];  Service: Orthopedics;  Laterality: Right;   AMPUTATION Right 06/13/2017   Procedure: RIGHT BELOW KNEE AMPUTATION;  Surgeon: [name], MD;  Location: [place];  Service: Orthopedics;  Laterality: Right;   BELOW KNEE LEG AMPUTATION Right 06/13/2017   BELOW KNEE LEG AMPUTATION     HERNIA REPAIR     I&D EXTREMITY Left 08/21/2014   Procedure: INCISION AND DRAINAGE LEFT SMALL FINGER;  Surgeon: Leanora Cover, MD;  Location: [place];  Service:  Orthopedics;  Laterality: Left;   I&D EXTREMITY Right 03/18/2017   Procedure: IRRIGATION AND DEBRIDEMENT EXTREMITY;  Surgeon: [name], MD;  Location: [place];  Service: Orthopedics;  Laterality: Right;   MINOR IRRIGATION AND DEBRIDEMENT OF WOUND Right 04/22/2014   Procedure: IRRIGATION AND DEBRIDEMENT OF RIGHT NECK ABCESS;  Surgeon: Jerrell Belfast, MD;  Location: [place];  Service: ENT;  Laterality: Right;   MULTIPLE EXTRACTIONS WITH ALVEOLOPLASTY N/A 01/18/2013   Procedure: MULTIPLE EXTRACION 3, 6, 7, 10, 11, 13, 21, 22, 27, 28, 29, 30 WITH ALVEOLOPLASTY;  Surgeon: Gae Bon, DDS;  Location: [place];  Service: Oral Surgery;  Laterality: N/A;   SKIN SPLIT GRAFT Right 03/21/2017   Procedure: IRRIGATION AND DEBRIDEMENT RIGHT FOOT AND APPLY SPLIT THICKNESS SKIN GRAFT AND WOUND VAC;  Surgeon: [name], MD;  Location: [place];  Service: Orthopedics;  Laterality: Right;   TEE WITHOUT CARDIOVERSION N/A 05/02/2017   Procedure: TRANSESOPHAGEAL ECHOCARDIOGRAM (TEE);  Surgeon: Acie Fredrickson Wonda Cheng, MD;  Location[facility] OR;  Service: Cardiovascular;  Laterality: N/A;  coincidental to orthopedic case        Home Medications    Prior to Admission medications   Medication Sig Start Date End Date Taking? Authorizing Provider  acetaminophen (TYLENOL) 325 MG tablet Take 2 tablets (650 mg total) by mouth every 6 (six) hours as needed. Patient taking differently: Take 650 mg by mouth every 6 (six) hours as needed.  08/08/18 08/08/19 Yes Mercy Riding, MD  amLODipine (NORVASC) 10 MG tablet Take 1 tablet (10 mg total) by mouth daily. 07/09/18  Yes [name], FNP  bictegravir-emtricitabine-tenofovir AF (BIKTARVY) 50-200-25 MG TABS tablet Take 1 tablet by mouth daily. 10/15/18  Yes [name], MD  blood glucose meter kit and supplies Dispense based on patient and insurance preference. Use up to four times daily as directed. (FOR ICD-10 E10.9, E11.9). 07/31/18  Yes [name], FNP  buprenorphine  (BUTRANS) 20 MCG/HR PTWK patch Place 1 patch onto the skin. 08/14/18  Yes [provider]  doxycycline (ADOXA) 100 MG tablet Take 1 tablet (100 mg total) by mouth 2 (two) times daily. 10/22/18  Yes Aundra Dubin, PA-C  EASY COMFORT PEN NEEDLES 31G X 5 MM MISC INJECT 20 UNITS DAILY AS DIRECTED. 10/19/18  Yes [name], FNP  escitalopram (LEXAPRO) 20 MG tablet Take 20 mg by mouth daily. 07/15/18  Yes [provider]  glipiZIDE (GLUCOTROL) 10 MG tablet Take 1 tablet (10 mg total) by mouth 2 (two) times daily before a meal. 09/14/18  Yes [name], FNP  glucose blood (COOL BLOOD GLUCOSE TEST STRIPS) test strip Use as instructed 01/05/18  Yes [name], FNP  Insulin Glargine (LANTUS SOLOSTAR) 100 UNIT/ML Solostar Pen Inject 30 Units into the skin daily. 09/14/18  Yes [name], FNP  insulin lispro (HUMALOG) 100 UNIT/ML injection Inject 10 units under skin 3 times a day,  with meals, for blood glucose levels greater than 125. 09/14/18 09/14/19 Yes [name], FNP  Insulin Syringes, Disposable, U-100 0.3 ML MISC 1 Syringe by Does not apply route 3 (three) times daily. 09/22/18  Yes [name], FNP  Lancets [facility] ULTRASOFT) lancets Use as instructed 01/05/18  Yes [name], FNP  metFORMIN (GLUCOPHAGE) 1000 MG tablet Take 1 tablet (1,000 mg total) by mouth 2 (two) times daily with a meal. Patient taking differently: Take 1,000 mg by mouth daily.  09/14/18  Yes [name], FNP  oxybutynin (DITROPAN) 5 MG tablet TAKE 1 TABLET (5 MG TOTAL) BY MOUTH 2 (TWO) TIMES DAILY. 10/19/18  Yes [name], FNP  Oxycodone HCl 10 MG TABS Take 10 mg by mouth 4 (four) times daily. 07/09/18  Yes [provider]  tadalafil (ADCIRCA/CIALIS) 20 MG tablet Take 0.5-1 tablets (10-20 mg total) by mouth every other day as needed for erectile dysfunction. 11/05/17  Yes [name], FNP  mupirocin ointment (BACTROBAN) 2 % Place 1 application into the nose 2  (two) times daily. Patient not taking: Reported on 10/15/2018 07/10/18   Rayburn, Neta Mends, PA-C  mupirocin ointment (BACTROBAN) 2 % Place 1 application into the nose 2 (two) times daily. Patient not taking: Reported on 10/25/2018 10/22/18   Aundra Dubin, PA-C    Family History Family History  Problem Relation Age of Onset   Hypertension Mother    Arthritis Father    Hypertension Father    Hypertension Brother    Cancer Maternal Grandmother 10       unknown type of cancer   Depression Paternal Grandmother     Social History Social History   Tobacco Use   Smoking status: Never Smoker   Smokeless tobacco: Never Used  Substance Use Topics   Alcohol use: Never    Frequency: Never   Drug use: Never    Comment: OTC ASA     Allergies   Sulfur and Sulfa antibiotics   Review of Systems Review of Systems  Constitutional: Positive for fever.  HENT: Negative for sore throat.   Eyes: Negative for visual disturbance.  Respiratory: Positive for cough. Negative for shortness of breath.   Cardiovascular: Negative for chest pain.  Gastrointestinal: Negative for abdominal pain.  Genitourinary: Negative for dysuria.  Musculoskeletal: Positive for myalgias.  Skin: Positive for wound. Negative for rash.  Neurological: Positive for headaches.     Physical Exam Updated Vital Signs BP (!) 152/85 (BP Location: Left Arm)    Pulse (!) 114    Temp 100.1 F (37.8 C) (Oral)    Resp 20    SpO2 97%   Physical Exam Vitals signs and nursing note reviewed.  Constitutional:      Appearance: He is well-developed.  HENT:     Head: Normocephalic and atraumatic.  Eyes:     Conjunctiva/sclera: Conjunctivae normal.  Neck:     Musculoskeletal: Neck supple.  Cardiovascular:     Rate and Rhythm: Regular rhythm. Tachycardia present.     Heart sounds: No murmur.  Pulmonary:     Effort: Pulmonary effort is normal. No respiratory distress.     Breath sounds: Normal breath sounds.   Abdominal:     Palpations: Abdomen is soft.     Tenderness: There is no abdominal tenderness.  Musculoskeletal:        General: Tenderness and signs of injury present.     Comments: Patient's left middle finger from the PIP to the  tip of his macerated and swollen with what looks like a flap laceration across the dorsum.  It is very tender but no pus was expressed.  He is unable to bend at that area.  He has some chronic nail deformities.  He also has a right BKA.  Skin:    General: Skin is warm and dry.     Capillary Refill: Capillary refill takes less than 2 seconds.  Neurological:     General: No focal deficit present.     Mental Status: He is alert and oriented to person, place, and time.     GCS: GCS eye subscore is 4. GCS verbal subscore is 5. GCS motor subscore is 6.      ED Treatments / Results  Labs (all labs ordered are listed, but only abnormal results are displayed) Labs Reviewed  COMPREHENSIVE METABOLIC PANEL - Abnormal; Notable for the following components:      Result Value   Sodium 124 (*)    Chloride 91 (*)    Glucose, Bld 475 (*)    BUN 24 (*)    Creatinine, Ser 2.05 (*)    Calcium 8.7 (*)    Total Protein 8.3 (*)    Albumin 2.8 (*)    GFR calc non Af Amer 36 (*)    GFR calc Af Amer 42 (*)    All other components within normal limits  CBC WITH DIFFERENTIAL/PLATELET - Abnormal; Notable for the following components:   RBC 4.15 (*)    Hemoglobin 10.8 (*)    HCT 34.6 (*)    All other components within normal limits  URINALYSIS, ROUTINE W REFLEX MICROSCOPIC - Abnormal; Notable for the following components:   APPearance HAZY (*)    Glucose, UA >=500 (*)    Hgb urine dipstick MODERATE (*)    Protein, ur 100 (*)    All other components within normal limits  BASIC METABOLIC PANEL - Abnormal; Notable for the following components:   Sodium 129 (*)    Glucose, Bld 427 (*)    BUN 23 (*)    Creatinine, Ser 1.85 (*)    Calcium 8.5 (*)    GFR calc non Af Amer 41  (*)    GFR calc Af Amer 47 (*)    All other components within normal limits  SARS CORONAVIRUS 2 (HOSPITAL ORDER, [place] [facility])  LACTIC ACID, PLASMA    EKG None  Radiology Dg Chest Port 1 View  Result Date: 10/25/2018 CLINICAL DATA:  Headache and cough for 2 weeks. EXAM: PORTABLE CHEST 1 VIEW COMPARISON:  Chest x-rays dated 10/11/2018 and 01/26/2018. FINDINGS: Heart size is upper normal, stable. Overall cardiomediastinal silhouette is stable. Lungs are clear. No pleural effusion or pneumothorax seen. Osseous structures about the chest are unremarkable. IMPRESSION: No active disease. No evidence of pneumonia or pulmonary edema. Electronically Signed   By: Franki Cabot M.D.   On: 10/25/2018 06:42   Dg Finger Middle Left  Result Date: 10/25/2018 CLINICAL DATA:  Left middle finger wound. EXAM: LEFT MIDDLE FINGER 2+V COMPARISON:  None. FINDINGS: There is no evidence of fracture or dislocation. There is no evidence of arthropathy or other focal bone abnormality. No lytic destruction is noted. Diffuse soft tissue swelling is noted concerning for infection. IMPRESSION: No fracture or dislocation is noted. Diffuse soft tissue swelling of the left middle finger is noted concerning for infection. Electronically Signed   By: Marijo Conception M.D.   On: 10/25/2018 08:28  Procedures Procedures (including critical care time)  Medications Ordered in ED Medications  acetaminophen (TYLENOL) tablet 650 mg (650 mg Oral Given 10/25/18 0650)  sodium chloride 0.9 % bolus 1,000 mL (0 mLs Intravenous Stopped 10/25/18 0959)  ceFAZolin (ANCEF) IVPB 1 g/50 mL premix (0 g Intravenous Stopped 10/25/18 0959)     Initial Impression / Assessment and Plan / ED Course  I have reviewed the triage vital signs and the nursing notes.  Pertinent labs & imaging results that were available during my care of the patient were reviewed by me and considered in my medical decision making (see chart for  details).  Clinical Course as of Oct 25 1726  Sun Oct 25, 8051  5661 53 year old male diabetic with poor compliance here with elevated blood sugars headache fever cough and wound on his left middle finger.  Differential includes Covid, pneumonia, wound infection, metabolic derangement, dehydration   [MB]  0736 Patient's labs significant for some hyponatremia, even with correction for his glucose is still going to be low.  He is also got worsening of his chronic renal insufficiency and an elevated glucose.  Hemoglobin is about his baseline and no white count.  Urine shows glucose but no gross signs of infection.   [MB]  [id number] Chest x-ray did not show any pneumonia and is not requiring oxygen.  We will give some fluids and antibiotics first finger check an x-ray there and he is agreeable to repeat Covid testing.   [MB]    Clinical Course User Index [MB] Hayden Rasmussen, MD   Keith [name] was evaluated in Emergency Department on 10/25/2018 for the symptoms described in the history of present illness. He was evaluated in the context of the global COVID-19 pandemic, which necessitated consideration that the patient might be at risk for infection with the SARS-CoV-2 virus that causes COVID-19. Institutional protocols and algorithms that pertain to the evaluation of patients at risk for COVID-19 are in a state of rapid change based on information released by regulatory bodies including the CDC and federal and state organizations. These policies and algorithms were followed during the patient's care in the ED.      Final Clinical Impressions(s) / ED Diagnoses   Final diagnoses:  Superficial injury of left middle finger with infection  Hyponatremia  Hyperglycemia    ED Discharge Orders         Ordered    cephALEXin (KEFLEX) 250 MG capsule  4 times daily     10/25/18 1049           Hayden Rasmussen, MD 10/25/18 1728

## 2018-10-26 ENCOUNTER — Ambulatory Visit: Payer: Medicare Other | Admitting: Podiatry

## 2018-10-27 ENCOUNTER — Ambulatory Visit: Payer: Medicare Other | Admitting: Family

## 2018-10-27 ENCOUNTER — Encounter (HOSPITAL_COMMUNITY): Payer: Self-pay | Admitting: Emergency Medicine

## 2018-10-27 ENCOUNTER — Inpatient Hospital Stay (HOSPITAL_COMMUNITY)
Admission: EM | Admit: 2018-10-27 | Discharge: 2018-11-03 | DRG: 565 | Disposition: A | Discharge status: Home Health | Payer: Medicare Other | Attending: Internal Medicine | Admitting: Internal Medicine

## 2018-10-27 ENCOUNTER — Other Ambulatory Visit: Payer: Self-pay

## 2018-10-27 DIAGNOSIS — Z89511 Acquired absence of right leg below knee: Secondary | ICD-10-CM

## 2018-10-27 DIAGNOSIS — E785 Hyperlipidemia, unspecified: Secondary | ICD-10-CM | POA: Diagnosis present

## 2018-10-27 DIAGNOSIS — N529 Male erectile dysfunction, unspecified: Secondary | ICD-10-CM | POA: Diagnosis present

## 2018-10-27 DIAGNOSIS — T8789 Other complications of amputation stump: Secondary | ICD-10-CM | POA: Diagnosis present

## 2018-10-27 DIAGNOSIS — K5900 Constipation, unspecified: Secondary | ICD-10-CM | POA: Diagnosis present

## 2018-10-27 DIAGNOSIS — Y835 Amputation of limb(s) as the cause of abnormal reaction of the patient, or of later complication, without mention of misadventure at the time of the procedure: Secondary | ICD-10-CM | POA: Diagnosis present

## 2018-10-27 DIAGNOSIS — N179 Acute kidney failure, unspecified: Secondary | ICD-10-CM | POA: Diagnosis present

## 2018-10-27 DIAGNOSIS — T8743 Infection of amputation stump, right lower extremity: Principal | ICD-10-CM | POA: Diagnosis present

## 2018-10-27 DIAGNOSIS — L03115 Cellulitis of right lower limb: Secondary | ICD-10-CM | POA: Diagnosis present

## 2018-10-27 DIAGNOSIS — R059 Cough, unspecified: Secondary | ICD-10-CM

## 2018-10-27 DIAGNOSIS — E1165 Type 2 diabetes mellitus with hyperglycemia: Secondary | ICD-10-CM | POA: Diagnosis present

## 2018-10-27 DIAGNOSIS — N182 Chronic kidney disease, stage 2 (mild): Secondary | ICD-10-CM | POA: Diagnosis present

## 2018-10-27 DIAGNOSIS — T8740 Infection of amputation stump, unspecified extremity: Secondary | ICD-10-CM | POA: Diagnosis present

## 2018-10-27 DIAGNOSIS — Z03818 Encounter for observation for suspected exposure to other biological agents ruled out: Secondary | ICD-10-CM | POA: Diagnosis not present

## 2018-10-27 DIAGNOSIS — E1129 Type 2 diabetes mellitus with other diabetic kidney complication: Secondary | ICD-10-CM | POA: Diagnosis present

## 2018-10-27 DIAGNOSIS — Z794 Long term (current) use of insulin: Secondary | ICD-10-CM

## 2018-10-27 DIAGNOSIS — Z22322 Carrier or suspected carrier of Methicillin resistant Staphylococcus aureus: Secondary | ICD-10-CM

## 2018-10-27 DIAGNOSIS — L89899 Pressure ulcer of other site, unspecified stage: Secondary | ICD-10-CM | POA: Diagnosis present

## 2018-10-27 DIAGNOSIS — Z20828 Contact with and (suspected) exposure to other viral communicable diseases: Secondary | ICD-10-CM | POA: Diagnosis present

## 2018-10-27 DIAGNOSIS — I129 Hypertensive chronic kidney disease with stage 1 through stage 4 chronic kidney disease, or unspecified chronic kidney disease: Secondary | ICD-10-CM | POA: Diagnosis present

## 2018-10-27 DIAGNOSIS — Z79899 Other long term (current) drug therapy: Secondary | ICD-10-CM

## 2018-10-27 DIAGNOSIS — W19XXXA Unspecified fall, initial encounter: Secondary | ICD-10-CM | POA: Diagnosis present

## 2018-10-27 DIAGNOSIS — E1122 Type 2 diabetes mellitus with diabetic chronic kidney disease: Secondary | ICD-10-CM | POA: Diagnosis present

## 2018-10-27 DIAGNOSIS — Z8249 Family history of ischemic heart disease and other diseases of the circulatory system: Secondary | ICD-10-CM

## 2018-10-27 DIAGNOSIS — I10 Essential (primary) hypertension: Secondary | ICD-10-CM | POA: Diagnosis present

## 2018-10-27 DIAGNOSIS — R739 Hyperglycemia, unspecified: Secondary | ICD-10-CM

## 2018-10-27 DIAGNOSIS — Z882 Allergy status to sulfonamides status: Secondary | ICD-10-CM

## 2018-10-27 DIAGNOSIS — R05 Cough: Secondary | ICD-10-CM

## 2018-10-27 DIAGNOSIS — B20 Human immunodeficiency virus [HIV] disease: Secondary | ICD-10-CM | POA: Diagnosis present

## 2018-10-27 DIAGNOSIS — E871 Hypo-osmolality and hyponatremia: Secondary | ICD-10-CM | POA: Diagnosis present

## 2018-10-27 NOTE — ED Triage Notes (Signed)
Pt in POV, reports infection to R stump, has worsened over the past few weeks. States he was told by Dr Sharol Given to come to ED for eval. Hx DM.

## 2018-10-28 ENCOUNTER — Inpatient Hospital Stay (HOSPITAL_COMMUNITY): Payer: Medicare Other

## 2018-10-28 ENCOUNTER — Other Ambulatory Visit: Payer: Self-pay

## 2018-10-28 DIAGNOSIS — T8789 Other complications of amputation stump: Secondary | ICD-10-CM | POA: Diagnosis present

## 2018-10-28 DIAGNOSIS — L89899 Pressure ulcer of other site, unspecified stage: Secondary | ICD-10-CM | POA: Diagnosis not present

## 2018-10-28 DIAGNOSIS — T8740 Infection of amputation stump, unspecified extremity: Secondary | ICD-10-CM | POA: Diagnosis not present

## 2018-10-28 DIAGNOSIS — B20 Human immunodeficiency virus [HIV] disease: Secondary | ICD-10-CM

## 2018-10-28 DIAGNOSIS — Z8249 Family history of ischemic heart disease and other diseases of the circulatory system: Secondary | ICD-10-CM | POA: Diagnosis not present

## 2018-10-28 DIAGNOSIS — R05 Cough: Secondary | ICD-10-CM | POA: Diagnosis not present

## 2018-10-28 DIAGNOSIS — L03115 Cellulitis of right lower limb: Secondary | ICD-10-CM

## 2018-10-28 DIAGNOSIS — N179 Acute kidney failure, unspecified: Secondary | ICD-10-CM | POA: Diagnosis present

## 2018-10-28 DIAGNOSIS — R739 Hyperglycemia, unspecified: Secondary | ICD-10-CM | POA: Diagnosis not present

## 2018-10-28 DIAGNOSIS — K5900 Constipation, unspecified: Secondary | ICD-10-CM | POA: Diagnosis present

## 2018-10-28 DIAGNOSIS — N182 Chronic kidney disease, stage 2 (mild): Secondary | ICD-10-CM | POA: Diagnosis present

## 2018-10-28 DIAGNOSIS — N529 Male erectile dysfunction, unspecified: Secondary | ICD-10-CM | POA: Diagnosis present

## 2018-10-28 DIAGNOSIS — I10 Essential (primary) hypertension: Secondary | ICD-10-CM | POA: Diagnosis not present

## 2018-10-28 DIAGNOSIS — E1165 Type 2 diabetes mellitus with hyperglycemia: Secondary | ICD-10-CM | POA: Diagnosis present

## 2018-10-28 DIAGNOSIS — Y835 Amputation of limb(s) as the cause of abnormal reaction of the patient, or of later complication, without mention of misadventure at the time of the procedure: Secondary | ICD-10-CM | POA: Diagnosis present

## 2018-10-28 DIAGNOSIS — E785 Hyperlipidemia, unspecified: Secondary | ICD-10-CM | POA: Diagnosis present

## 2018-10-28 DIAGNOSIS — Z89511 Acquired absence of right leg below knee: Secondary | ICD-10-CM | POA: Diagnosis not present

## 2018-10-28 DIAGNOSIS — Z882 Allergy status to sulfonamides status: Secondary | ICD-10-CM | POA: Diagnosis not present

## 2018-10-28 DIAGNOSIS — Z794 Long term (current) use of insulin: Secondary | ICD-10-CM | POA: Diagnosis not present

## 2018-10-28 DIAGNOSIS — Z20828 Contact with and (suspected) exposure to other viral communicable diseases: Secondary | ICD-10-CM | POA: Diagnosis present

## 2018-10-28 DIAGNOSIS — E871 Hypo-osmolality and hyponatremia: Secondary | ICD-10-CM | POA: Diagnosis present

## 2018-10-28 DIAGNOSIS — W19XXXA Unspecified fall, initial encounter: Secondary | ICD-10-CM | POA: Diagnosis present

## 2018-10-28 DIAGNOSIS — E1122 Type 2 diabetes mellitus with diabetic chronic kidney disease: Secondary | ICD-10-CM | POA: Diagnosis not present

## 2018-10-28 DIAGNOSIS — Z22322 Carrier or suspected carrier of Methicillin resistant Staphylococcus aureus: Secondary | ICD-10-CM | POA: Diagnosis not present

## 2018-10-28 DIAGNOSIS — Z79899 Other long term (current) drug therapy: Secondary | ICD-10-CM | POA: Diagnosis not present

## 2018-10-28 DIAGNOSIS — T8743 Infection of amputation stump, right lower extremity: Secondary | ICD-10-CM | POA: Diagnosis present

## 2018-10-28 DIAGNOSIS — I129 Hypertensive chronic kidney disease with stage 1 through stage 4 chronic kidney disease, or unspecified chronic kidney disease: Secondary | ICD-10-CM | POA: Diagnosis present

## 2018-10-28 LAB — BASIC METABOLIC PANEL
Anion gap: 10 (ref 5–15)
Anion gap: 12 (ref 5–15)
Anion gap: 8 (ref 5–15)
BUN: 17 mg/dL (ref 6–20)
BUN: 17 mg/dL (ref 6–20)
BUN: 19 mg/dL (ref 6–20)
CO2: 22 mmol/L (ref 22–32)
CO2: 24 mmol/L (ref 22–32)
CO2: 26 mmol/L (ref 22–32)
Calcium: 8.8 mg/dL — ABNORMAL LOW (ref 8.9–10.3)
Calcium: 8.9 mg/dL (ref 8.9–10.3)
Calcium: 9 mg/dL (ref 8.9–10.3)
Chloride: 101 mmol/L (ref 98–111)
Chloride: 102 mmol/L (ref 98–111)
Chloride: 92 mmol/L — ABNORMAL LOW (ref 98–111)
Creatinine, Ser: 1.43 mg/dL — ABNORMAL HIGH (ref 0.61–1.24)
Creatinine, Ser: 1.47 mg/dL — ABNORMAL HIGH (ref 0.61–1.24)
Creatinine, Ser: 1.82 mg/dL — ABNORMAL HIGH (ref 0.61–1.24)
GFR calc Af Amer: 48 mL/min — ABNORMAL LOW (ref >60)
GFR calc Af Amer: >60 mL/min (ref >60)
GFR calc Af Amer: >60 mL/min (ref >60)
GFR calc non Af Amer: 41 mL/min — ABNORMAL LOW (ref >60)
GFR calc non Af Amer: 54 mL/min — ABNORMAL LOW (ref >60)
GFR calc non Af Amer: 56 mL/min — ABNORMAL LOW (ref >60)
Glucose, Bld: 164 mg/dL — ABNORMAL HIGH (ref 70–99)
Glucose, Bld: 208 mg/dL — ABNORMAL HIGH (ref 70–99)
Glucose, Bld: 653 mg/dL (ref 70–99)
Potassium: 3.4 mmol/L — ABNORMAL LOW (ref 3.5–5.1)
Potassium: 3.5 mmol/L (ref 3.5–5.1)
Potassium: 3.8 mmol/L (ref 3.5–5.1)
Sodium: 126 mmol/L — ABNORMAL LOW (ref 135–145)
Sodium: 135 mmol/L (ref 135–145)
Sodium: 136 mmol/L (ref 135–145)

## 2018-10-28 LAB — CBG MONITORING, ED
Glucose-Capillary: 500 mg/dL — ABNORMAL HIGH (ref 70–99)
Glucose-Capillary: 455 mg/dL — ABNORMAL HIGH (ref 70–99)
Glucose-Capillary: 310 mg/dL — ABNORMAL HIGH (ref 70–99)
Glucose-Capillary: 248 mg/dL — ABNORMAL HIGH (ref 70–99)
Glucose-Capillary: 153 mg/dL — ABNORMAL HIGH (ref 70–99)
Glucose-Capillary: 128 mg/dL — ABNORMAL HIGH (ref 70–99)

## 2018-10-28 LAB — CBC
HCT: 32.3 % — ABNORMAL LOW (ref 39.0–52.0)
Hemoglobin: 9.9 g/dL — ABNORMAL LOW (ref 13.0–17.0)
MCH: 25.6 pg — ABNORMAL LOW (ref 26.0–34.0)
MCHC: 30.7 g/dL (ref 30.0–36.0)
MCV: 83.7 fL (ref 80.0–100.0)
Platelets: 285 10*3/uL (ref 150–400)
RBC: 3.86 MIL/uL — ABNORMAL LOW (ref 4.22–5.81)
RDW: 12.5 % (ref 11.5–15.5)
WBC: 6.4 10*3/uL (ref 4.0–10.5)
nRBC: 0 % (ref 0.0–0.2)

## 2018-10-28 LAB — GLUCOSE, CAPILLARY
Glucose-Capillary: 218 mg/dL — ABNORMAL HIGH (ref 70–99)
Glucose-Capillary: 315 mg/dL — ABNORMAL HIGH (ref 70–99)
Glucose-Capillary: 389 mg/dL — ABNORMAL HIGH (ref 70–99)
Glucose-Capillary: 346 mg/dL — ABNORMAL HIGH (ref 70–99)
Glucose-Capillary: 286 mg/dL — ABNORMAL HIGH (ref 70–99)

## 2018-10-28 LAB — SARS CORONAVIRUS 2 BY RT PCR (HOSPITAL ORDER, PERFORMED IN ~~LOC~~ HOSPITAL LAB): SARS Coronavirus 2: NEGATIVE

## 2018-10-28 MED ORDER — ENOXAPARIN SODIUM 40 MG/0.4ML ~~LOC~~ SOLN
40.0000 mg | Freq: Every day | SUBCUTANEOUS | Status: DC
  Administered 2018-10-28 – 2018-11-03 (×7): 40 mg via SUBCUTANEOUS
  Filled 2018-10-28 (×7): qty 0.4

## 2018-10-28 MED ORDER — ONDANSETRON HCL 4 MG PO TABS: 4.0000 mg | ORAL_TABLET | Freq: Four times a day (QID) | ORAL | Status: DC | PRN

## 2018-10-28 MED ORDER — VANCOMYCIN HCL 10 G IV SOLR
1500.0000 mg | INTRAVENOUS | Status: DC
  Administered 2018-10-28 – 2018-10-29 (×2): 1500 mg via INTRAVENOUS
  Filled 2018-10-28 (×3): qty 1500

## 2018-10-28 MED ORDER — ESCITALOPRAM OXALATE 10 MG PO TABS
20.0000 mg | ORAL_TABLET | Freq: Every day | ORAL | Status: DC
  Administered 2018-10-28 – 2018-11-03 (×7): 20 mg via ORAL
  Filled 2018-10-28 (×7): qty 2

## 2018-10-28 MED ORDER — INSULIN REGULAR(HUMAN) IN NACL 100-0.9 UT/100ML-% IV SOLN
INTRAVENOUS | Status: DC
  Administered 2018-10-28: 4 [IU]/h via INTRAVENOUS
  Filled 2018-10-28: qty 100

## 2018-10-28 MED ORDER — DEXTROSE 50 % IV SOLN: 25.0000 mL | INTRAVENOUS | Status: DC | PRN

## 2018-10-28 MED ORDER — VANCOMYCIN HCL 10 G IV SOLR
1500.0000 mg | Freq: Once | INTRAVENOUS | Status: AC
  Administered 2018-10-28: 1500 mg via INTRAVENOUS
  Filled 2018-10-28: qty 1500

## 2018-10-28 MED ORDER — ACETAMINOPHEN 325 MG PO TABS
650.0000 mg | ORAL_TABLET | Freq: Four times a day (QID) | ORAL | Status: DC | PRN
  Administered 2018-10-29: 650 mg via ORAL
  Filled 2018-10-28 (×2): qty 2

## 2018-10-28 MED ORDER — SODIUM CHLORIDE 0.9 % IV BOLUS (SEPSIS)
1000.0000 mL | Freq: Once | INTRAVENOUS | Status: AC
  Administered 2018-10-28: 1000 mL via INTRAVENOUS

## 2018-10-28 MED ORDER — OXYCODONE HCL 10 MG PO TABS: 10.0000 mg | ORAL_TABLET | Freq: Four times a day (QID) | ORAL | Status: DC

## 2018-10-28 MED ORDER — BUPRENORPHINE 20 MCG/HR TD PTWK: 1.0000 | MEDICATED_PATCH | TRANSDERMAL | Status: DC

## 2018-10-28 MED ORDER — OXYBUTYNIN CHLORIDE 5 MG PO TABS
5.0000 mg | ORAL_TABLET | Freq: Two times a day (BID) | ORAL | Status: DC
  Administered 2018-10-28 – 2018-11-03 (×13): 5 mg via ORAL
  Filled 2018-10-28 (×14): qty 1

## 2018-10-28 MED ORDER — INSULIN GLARGINE 100 UNIT/ML ~~LOC~~ SOLN
20.0000 [IU] | Freq: Every day | SUBCUTANEOUS | Status: DC
  Administered 2018-10-29 – 2018-11-03 (×6): 20 [IU] via SUBCUTANEOUS
  Filled 2018-10-28 (×7): qty 0.2

## 2018-10-28 MED ORDER — DAPSONE 100 MG PO TABS
100.0000 mg | ORAL_TABLET | Freq: Every day | ORAL | Status: DC
  Administered 2018-10-28 – 2018-11-03 (×7): 100 mg via ORAL
  Filled 2018-10-28 (×8): qty 1

## 2018-10-28 MED ORDER — INSULIN ASPART 100 UNIT/ML ~~LOC~~ SOLN
10.0000 [IU] | Freq: Once | SUBCUTANEOUS | Status: AC
  Administered 2018-10-28: 10 [IU] via SUBCUTANEOUS

## 2018-10-28 MED ORDER — POTASSIUM CHLORIDE CRYS ER 20 MEQ PO TBCR
20.0000 meq | EXTENDED_RELEASE_TABLET | Freq: Once | ORAL | Status: AC
  Administered 2018-10-28: 20 meq via ORAL
  Filled 2018-10-28: qty 1

## 2018-10-28 MED ORDER — INSULIN ASPART 100 UNIT/ML ~~LOC~~ SOLN
0.0000 [IU] | Freq: Three times a day (TID) | SUBCUTANEOUS | Status: DC
  Administered 2018-10-28: 11 [IU] via SUBCUTANEOUS
  Administered 2018-10-29: 3 [IU] via SUBCUTANEOUS
  Administered 2018-10-29 (×2): 8 [IU] via SUBCUTANEOUS
  Administered 2018-10-30: 3 [IU] via SUBCUTANEOUS
  Administered 2018-10-30 (×2): 2 [IU] via SUBCUTANEOUS
  Administered 2018-10-31: 3 [IU] via SUBCUTANEOUS
  Administered 2018-10-31: 5 [IU] via SUBCUTANEOUS
  Administered 2018-10-31: 2 [IU] via SUBCUTANEOUS
  Administered 2018-11-01 (×2): 5 [IU] via SUBCUTANEOUS
  Administered 2018-11-01: 2 [IU] via SUBCUTANEOUS
  Administered 2018-11-02 (×2): 5 [IU] via SUBCUTANEOUS
  Administered 2018-11-03: 3 [IU] via SUBCUTANEOUS

## 2018-10-28 MED ORDER — INSULIN GLARGINE 100 UNIT/ML ~~LOC~~ SOLN
10.0000 [IU] | Freq: Every day | SUBCUTANEOUS | Status: DC
  Administered 2018-10-28: 10 [IU] via SUBCUTANEOUS
  Filled 2018-10-28 (×2): qty 0.1

## 2018-10-28 MED ORDER — DEXTROSE-NACL 5-0.45 % IV SOLN
INTRAVENOUS | Status: DC
  Administered 2018-10-28: 08:00:00 via INTRAVENOUS

## 2018-10-28 MED ORDER — POTASSIUM CHLORIDE CRYS ER 20 MEQ PO TBCR
40.0000 meq | EXTENDED_RELEASE_TABLET | Freq: Two times a day (BID) | ORAL | Status: DC
  Administered 2018-10-28 – 2018-11-03 (×13): 40 meq via ORAL
  Filled 2018-10-28 (×14): qty 2

## 2018-10-28 MED ORDER — INSULIN REGULAR BOLUS VIA INFUSION
0.0000 [IU] | Freq: Three times a day (TID) | INTRAVENOUS | Status: DC
  Filled 2018-10-28: qty 10

## 2018-10-28 MED ORDER — SODIUM CHLORIDE 0.9 % IV SOLN
INTRAVENOUS | Status: DC
  Administered 2018-10-28: 08:00:00 via INTRAVENOUS

## 2018-10-28 MED ORDER — BICTEGRAVIR-EMTRICITAB-TENOFOV 50-200-25 MG PO TABS
1.0000 | ORAL_TABLET | Freq: Every day | ORAL | Status: DC
  Administered 2018-10-29 – 2018-11-03 (×6): 1 via ORAL
  Filled 2018-10-28 (×8): qty 1

## 2018-10-28 MED ORDER — HYDRALAZINE HCL 20 MG/ML IJ SOLN: 10.0000 mg | INTRAMUSCULAR | Status: DC | PRN

## 2018-10-28 MED ORDER — AMLODIPINE BESYLATE 10 MG PO TABS
10.0000 mg | ORAL_TABLET | Freq: Every day | ORAL | Status: DC
  Administered 2018-10-28 – 2018-11-03 (×7): 10 mg via ORAL
  Filled 2018-10-28 (×6): qty 1
  Filled 2018-10-28: qty 2
  Filled 2018-10-28: qty 1
  Filled 2018-10-28: qty 2

## 2018-10-28 MED ORDER — OXYCODONE HCL 5 MG PO TABS
10.0000 mg | ORAL_TABLET | Freq: Four times a day (QID) | ORAL | Status: DC | PRN
  Administered 2018-10-29 – 2018-10-30 (×2): 10 mg via ORAL
  Filled 2018-10-28 (×3): qty 2

## 2018-10-28 MED ORDER — INSULIN GLARGINE 100 UNIT/ML ~~LOC~~ SOLN
10.0000 [IU] | Freq: Once | SUBCUTANEOUS | Status: AC
  Administered 2018-10-28: 10 [IU] via SUBCUTANEOUS
  Filled 2018-10-28: qty 0.1

## 2018-10-28 MED ORDER — ACETAMINOPHEN 650 MG RE SUPP: 650.0000 mg | Freq: Four times a day (QID) | RECTAL | Status: DC | PRN

## 2018-10-28 MED ORDER — COLLAGENASE 250 UNIT/GM EX OINT
TOPICAL_OINTMENT | Freq: Two times a day (BID) | CUTANEOUS | Status: DC
  Administered 2018-10-28 – 2018-11-03 (×7): via TOPICAL
  Filled 2018-10-28: qty 30

## 2018-10-28 MED ORDER — ONDANSETRON HCL 4 MG/2ML IJ SOLN
4.0000 mg | Freq: Four times a day (QID) | INTRAMUSCULAR | Status: DC | PRN
  Administered 2018-10-29: 4 mg via INTRAVENOUS
  Filled 2018-10-28: qty 2

## 2018-10-28 MED ORDER — SODIUM CHLORIDE 0.9 % IV SOLN
INTRAVENOUS | Status: DC
  Administered 2018-10-28 – 2018-11-02 (×6): via INTRAVENOUS

## 2018-10-28 MED ORDER — INSULIN ASPART 100 UNIT/ML ~~LOC~~ SOLN
0.0000 [IU] | Freq: Every day | SUBCUTANEOUS | Status: DC
  Administered 2018-10-28: 3 [IU] via SUBCUTANEOUS
  Administered 2018-10-29 – 2018-10-31 (×2): 2 [IU] via SUBCUTANEOUS

## 2018-10-28 NOTE — ED Notes (Signed)
Pt seen pressing buttons on IV pump. Pt asked not to do so. Pt remains noncompliant.

## 2018-10-28 NOTE — Progress Notes (Signed)
Patient on insulin gtt at 0.1 per MD order from ED, rechecked CBG at 1200 and blood glucose at 218. Was going to adjust rate on gtt but received error message since Lantus is scheduled. Paged MD to clarify orders for transitioning from gtt to lantus. Awaiting orders. Will continue to monitor.

## 2018-10-28 NOTE — ED Triage Notes (Signed)
PT Pt refuses all PO meds until he eats.  Pt given a  Happy meal .

## 2018-10-28 NOTE — Consult Note (Signed)
[place] Nurse wound consult note Reason for Consult:Right bKA stump with traumatic wound.  Socket is noted as too large in MD office two weeks ago.  Patient reports a fall and injury to right BKA stump at that time.  Nonhealing with increased drainage and purulence.  Wound type: infectious Pressure Injury POA: NA Measurement: 3.4 cm x 1.2 cm 100% slough Wound BTV:DFPBHE Drainage (amount, consistency, odor) minimal purulence noted.  Musty odor.  Periwound:Healing stump.  Callous at socket site.  Dressing procedure/placement/frequency: Cleanse wound to right stump with NS and pat dry.  Apply santyl to wound bed. Cover with NS moist gauze.  Cover with dry dressing and tape.  Change twice daily.  Will not follow at this time.  Please re-consult if needed.  Domenic Moras MSN, RN, FNP-BC CWON Wound, Ostomy, Continence Nurse Pager [phone]

## 2018-10-28 NOTE — H&P (Signed)
History and Physical    Keith [name] BDZ:[id number] DOB: [birth year] DOA: 10/27/2018  PCP: System, Pcp Not In  Patient coming from: Home  I have personally briefly reviewed patient's old medical records in [place]  Chief Complaint: Leg stump infection  HPI: Keith [name] is a 53 y.o. male with medical history significant of DM2, HIV, HTN, s/p R BKA.  Patient presents to the ED with ongoing ulcer and foul smelling drainage from his R BKA stump.  Seen recently by [name] on 7/16 for this.  Started on doxycycline, but symptoms persisted / worsened.  Seen again in ED on 7/19 and started on Keflex.  Symptoms again persisted and worsened with increased foul smelling drainage.  Denies any leg trauma.  No fevers, no vomiting.   ED Course: BGL 653.  Creat 1.82 (about baseline).  Patient started on vanc by EDP.  Hospitalist asked to admit as he has failed outpatient antibiotics.   Review of Systems: As per HPI, otherwise all review of systems negative.  Past Medical History:  Diagnosis Date   Abscess of right foot    AIDS ([place])    Chronic knee pain    right   Chronic pain    Diabetes mellitus without complication ([place])    Diabetes type 2, uncontrolled ([place])    HgA1c 17.6 (04/27/2010)   Diabetic foot ulcer ([place]) 01/2017   right foot   Erectile dysfunction    Genital warts    HIV (human immunodeficiency virus infection) ([place]) 2009   CD4 count 100, VL 13800 (05/01/2010)   Hypertension    Osteomyelitis ([place])    h/o hand   Osteomyelitis of metatarsal ([place]) 04/28/2017    Past Surgical History:  Procedure Laterality Date   AMPUTATION Right 05/02/2017   Procedure: AMPUTATION TRANSMETARSAL;  Surgeon: [name], MD;  Location: [facility];  Service: Orthopedics;  Laterality: Right;   AMPUTATION Right 06/13/2017   Procedure: RIGHT BELOW KNEE AMPUTATION;  Surgeon: [name], MD;  Location: [facility];  Service: Orthopedics;  Laterality: Right;   BELOW KNEE LEG  AMPUTATION Right 06/13/2017   BELOW KNEE LEG AMPUTATION     HERNIA REPAIR     I&D EXTREMITY Left 08/21/2014   Procedure: INCISION AND DRAINAGE LEFT SMALL FINGER;  Surgeon: Leanora Cover, MD;  Location: [facility];  Service: Orthopedics;  Laterality: Left;   I&D EXTREMITY Right 03/18/2017   Procedure: IRRIGATION AND DEBRIDEMENT EXTREMITY;  Surgeon: [name], MD;  Location: [facility];  Service: Orthopedics;  Laterality: Right;   MINOR IRRIGATION AND DEBRIDEMENT OF WOUND Right 04/22/2014   Procedure: IRRIGATION AND DEBRIDEMENT OF RIGHT NECK ABCESS;  Surgeon: Jerrell Belfast, MD;  Location: [facility];  Service: ENT;  Laterality: Right;   MULTIPLE EXTRACTIONS WITH ALVEOLOPLASTY N/A 01/18/2013   Procedure: MULTIPLE EXTRACION 3, 6, 7, 10, 11, 13, 21, 22, 27, 28, 29, 30 WITH ALVEOLOPLASTY;  Surgeon: Gae Bon, DDS;  Location: [facility];  Service: Oral Surgery;  Laterality: N/A;   SKIN SPLIT GRAFT Right 03/21/2017   Procedure: IRRIGATION AND DEBRIDEMENT RIGHT FOOT AND APPLY SPLIT THICKNESS SKIN GRAFT AND WOUND VAC;  Surgeon: [name], MD;  Location: [facility];  Service: Orthopedics;  Laterality: Right;   TEE WITHOUT CARDIOVERSION N/A 05/02/2017   Procedure: TRANSESOPHAGEAL ECHOCARDIOGRAM (TEE);  Surgeon: Acie Fredrickson Wonda Cheng, MD;  Location: [facility];  Service: Cardiovascular;  Laterality: N/A;  coincidental to orthopedic case     reports that he has never smoked. He  has never used smokeless tobacco. He reports that he does not drink alcohol or use drugs.  Allergies  Allergen Reactions   Sulfur Itching    Patient stated he's allergic to "sulfur" AND "sulfa"   Sulfa Antibiotics Itching    Family History  Problem Relation Age of Onset   Hypertension Mother    Arthritis Father    Hypertension Father    Hypertension Brother    Cancer Maternal Grandmother 48       unknown type of cancer   Depression Paternal Grandmother      Prior to Admission medications   Medication Sig Start Date End Date  Taking? Authorizing Provider  acetaminophen (TYLENOL) 325 MG tablet Take 2 tablets (650 mg total) by mouth every 6 (six) hours as needed. Patient taking differently: Take 650 mg by mouth every 6 (six) hours as needed.  08/08/18 08/08/19  Mercy Riding, MD  amLODipine (NORVASC) 10 MG tablet Take 1 tablet (10 mg total) by mouth daily. 07/09/18   [name], FNP  bictegravir-emtricitabine-tenofovir AF (BIKTARVY) 50-200-25 MG TABS tablet Take 1 tablet by mouth daily. 10/15/18   [name], MD  blood glucose meter kit and supplies Dispense based on patient and insurance preference. Use up to four times daily as directed. (FOR ICD-10 E10.9, E11.9). 07/31/18   [name], FNP  buprenorphine (BUTRANS) 20 MCG/HR PTWK patch Place 1 patch onto the skin. 08/14/18   [provider]  cephALEXin (KEFLEX) 250 MG capsule Take 1 capsule (250 mg total) by mouth 4 (four) times daily. 10/25/18   Hayden Rasmussen, MD  doxycycline (ADOXA) 100 MG tablet Take 1 tablet (100 mg total) by mouth 2 (two) times daily. 10/22/18   Aundra Dubin, PA-C  EASY COMFORT PEN NEEDLES 31G X 5 MM MISC INJECT 20 UNITS DAILY AS DIRECTED. 10/19/18   [name], FNP  escitalopram (LEXAPRO) 20 MG tablet Take 20 mg by mouth daily. 07/15/18   [provider]  glipiZIDE (GLUCOTROL) 10 MG tablet Take 1 tablet (10 mg total) by mouth 2 (two) times daily before a meal. 09/14/18   [name], FNP  glucose blood (COOL BLOOD GLUCOSE TEST STRIPS) test strip Use as instructed 01/05/18   [name], FNP  Insulin Glargine (LANTUS SOLOSTAR) 100 UNIT/ML Solostar Pen Inject 30 Units into the skin daily. 09/14/18   [name], FNP  insulin lispro (HUMALOG) 100 UNIT/ML injection Inject 10 units under skin 3 times a day, with meals, for blood glucose levels greater than 125. 09/14/18 09/14/19  [name], FNP  Insulin Syringes, Disposable, U-100 0.3 ML MISC 1 Syringe by Does not apply route 3 (three) times daily.  09/22/18   [name], FNP  Lancets [facility] ULTRASOFT) lancets Use as instructed 01/05/18   [name], FNP  metFORMIN (GLUCOPHAGE) 1000 MG tablet Take 1 tablet (1,000 mg total) by mouth 2 (two) times daily with a meal. Patient taking differently: Take 1,000 mg by mouth daily.  09/14/18   [name], FNP  oxybutynin (DITROPAN) 5 MG tablet TAKE 1 TABLET (5 MG TOTAL) BY MOUTH 2 (TWO) TIMES DAILY. 10/19/18   [name], FNP  Oxycodone HCl 10 MG TABS Take 10 mg by mouth 4 (four) times daily. 07/09/18   [provider]  tadalafil (ADCIRCA/CIALIS) 20 MG tablet Take 0.5-1 tablets (10-20 mg total) by mouth every other day as needed for erectile dysfunction. 11/05/17   [name], FNP  Physical Exam: Vitals:   10/28/18 0345 10/28/18 0400 10/28/18 0430 10/28/18 0445  BP: (!) 151/88 (!) 191/96 (!) 193/101 (!) 189/105  Pulse:  72 76   Resp:      Temp:      TempSrc:      SpO2:  97% 98%   Weight:      Height:        Constitutional: NAD, calm, comfortable Eyes: PERRL, lids and conjunctivae normal ENMT: Mucous membranes are moist. Posterior pharynx clear of any exudate or lesions.Normal dentition.  Neck: normal, supple, no masses, no thyromegaly Respiratory: clear to auscultation bilaterally, no wheezing, no crackles. Normal respiratory effort. No accessory muscle use.  Cardiovascular: Regular rate and rhythm, no murmurs / rubs / gallops. No extremity edema. 2+ pedal pulses. No carotid bruits.  Abdomen: no tenderness, no masses palpated. No hepatosplenomegaly. Bowel sounds positive.  Musculoskeletal: no clubbing / cyanosis. No joint deformity upper and lower extremities. Good ROM, no contractures. Normal muscle tone.  Skin:  Patient gave verbal permission to utilize photo for medical documentation only The image was not stored on any personal device  Neurologic: CN 2-12 grossly intact. Sensation intact, DTR normal. Strength 5/5 in all 4.  Psychiatric: Normal  judgment and insight. Alert and oriented x 3. Normal mood.    Labs on Admission: I have personally reviewed following labs and imaging studies  CBC: Recent Labs  Lab 10/25/18 0410 10/27/18 2355  WBC 10.3 6.4  NEUTROABS 7.5  --   HGB 10.8* 9.9*  HCT 34.6* 32.3*  MCV 83.4 83.7  PLT 233 194   Basic Metabolic Panel: Recent Labs  Lab 10/25/18 0410 10/25/18 0953 10/27/18 2355  NA 124* 129* 126*  K 3.8 3.9 3.8  CL 91* 98 92*  CO2 '23 23 22  '$ GLUCOSE 475* 427* 653*  BUN 24* 23* 19  CREATININE 2.05* 1.85* 1.82*  CALCIUM 8.7* 8.5* 8.8*   GFR: Estimated Creatinine Clearance: 58.3 mL/min (A) (by C-G formula based on SCr of 1.82 mg/dL (H)). Liver Function Tests: Recent Labs  Lab 10/25/18 0410  AST 18  ALT 13  ALKPHOS 121  BILITOT 0.6  PROT 8.3*  ALBUMIN 2.8*   No results for input(s): LIPASE, AMYLASE in the last 168 hours. No results for input(s): AMMONIA in the last 168 hours. Coagulation Profile: No results for input(s): INR, PROTIME in the last 168 hours. Cardiac Enzymes: No results for input(s): CKTOTAL, CKMB, CKMBINDEX, TROPONINI in the last 168 hours. BNP (last 3 results) No results for input(s): PROBNP in the last 8760 hours. HbA1C: No results for input(s): HGBA1C in the last 72 hours. CBG: Recent Labs  Lab 10/28/18 0338  GLUCAP 500*   Lipid Profile: No results for input(s): CHOL, HDL, LDLCALC, TRIG, CHOLHDL, LDLDIRECT in the last 72 hours. Thyroid Function Tests: No results for input(s): TSH, T4TOTAL, FREET4, T3FREE, THYROIDAB in the last 72 hours. Anemia Panel: No results for input(s): VITAMINB12, FOLATE, FERRITIN, TIBC, IRON, RETICCTPCT in the last 72 hours. Urine analysis:    Component Value Date/Time   COLORURINE YELLOW 10/25/2018 0605   APPEARANCEUR HAZY (A) 10/25/2018 0605   LABSPEC 1.017 10/25/2018 0605   PHURINE 5.0 10/25/2018 0605   GLUCOSEU >=500 (A) 10/25/2018 0605   HGBUR MODERATE (A) 10/25/2018 0605   BILIRUBINUR NEGATIVE 10/25/2018  0605   BILIRUBINUR negative 09/07/2018 1034   KETONESUR NEGATIVE 10/25/2018 0605   PROTEINUR 100 (A) 10/25/2018 0605   UROBILINOGEN 1.0 09/07/2018 1034   UROBILINOGEN 1.0 01/01/2017 1032   NITRITE  NEGATIVE 10/25/2018 0605   LEUKOCYTESUR NEGATIVE 10/25/2018 0605    Radiological Exams on Admission: No results found.  EKG: Independently reviewed.  Assessment/Plan Principal Problem:   Amputation stump infection ([place]) Active Problems:   Essential hypertension   HIV disease ([place])   Acquired absence of right leg below knee ([facility])   Type II diabetes mellitus with renal manifestations ([place])    1. Amputation stump infection and pressure ulcer - 1. Failed outpt antibiotics, has h/o MRSA+ nares 08/07/2018 2. Wound care consult 3. Continue empiric vanc started in ED 4. If worsens or he doesn't improve, would add zosyn for GN coverage, but will hold off for now since he isnt really having systemic findings of infection nor sepsis at this time. 5. May wish to consult [name] in AM 2. DM2 with hyperglycemia - 1. Hold home meds 2. Insulin gtt 3. Kdur 79mq PO x1 for K 3.8. 4. Repeat BMP at noon 5. IVF: NS at 125 cc/hr then D5 half at 100 cc/hr 3. HIV disease - 1. Continue HAART 4. HTN - 1. continue home BP meds 2. Add PRN hydralazine  DVT prophylaxis: Lovenox Code Status: Full Family Communication: No family in room Disposition Plan: Home after admit Consults called: None Admission status: Admit to inpatient  Severity of Illness: The appropriate patient status for this patient is INPATIENT. Inpatient status is judged to be reasonable and necessary in order to provide the required intensity of service to ensure the patient's safety. The patient's presenting symptoms, physical exam findings, and initial radiographic and laboratory data in the context of their chronic comorbidities is felt to place them at high risk for further clinical deterioration. Furthermore, it is not anticipated  that the patient will be medically stable for discharge from the hospital within 2 midnights of admission. The following factors support the patient status of inpatient.   IP status for treatment of BKA stump infection, failed outpatient ABx courses.   * I certify that at the point of admission it is my clinical judgment that the patient will require inpatient hospital care spanning beyond 2 midnights from the point of admission due to high intensity of service, high risk for further deterioration and high frequency of surveillance required.*    Marriana Hibberd M. DO [facility]  How to contact the TClinica Espanola IncAttending or Consulting provider [place] covering provider during after hours [place] for this patient?  1. Check the care team in [facility]and look for a) attending/consulting TRH provider listed and b) the TMethodist Hospital For Surgeryteam listed 2. Log into [email or web address]  Amion Physician Scheduling and messaging for groups and whole hospitals  On call and physician scheduling software for group practices, residents, hospitalists and other medical providers for call, clinic, rotation and shift schedules. On[employer] is a hospital-wide system for scheduling doctors and paging doctors on call. EasyPlot is for scientific plotting and data analysis.  [email or web address]  and use [place]'s universal password to access. If you do not have the password, please contact the hospital operator.  3. Locate the TRockledge Fl Endoscopy Asc LLCprovider you are looking for under [facility] and page to a number that you can be directly reached. 4. If you still have difficulty reaching the provider, please page the [facility](Director on Call) for the Hospitalists listed on amion for assistance.  10/28/2018, 5:04 AM

## 2018-10-28 NOTE — Progress Notes (Signed)
Keith [name] is a 53 y.o. male with medical history significant of DM2, HIV, HTN, s/p R BKA.  Patient presents to the ED with ongoing ulcer and foul smelling drainage from his R BKA stump.  Seen recently by [name] on 7/16 for this.  Started on doxycycline, but symptoms persisted / worsened.  Seen again in ED on 7/19 and started on Keflex.  Symptoms again persisted and worsened with increased foul smelling drainage.  Denies any leg trauma.  No fevers, no vomiting.  10/28/18: Patient was seen and examined at his bedside in the ED.  Reports 10 out of 10 pain in his right stump prior to receiving his pain medication.  He has multiple wounds for which wound care specialist has been consulted.  Prior MRSA screening positive 08/2018 on IV vancomycin empirically for R stump ulcer.  Hyperglycemic on presentation with blood sugar greater than 650 started on insulin drip.  Last anion gap 12, repeat lab studies pending.  Please refer to H&P dictated by Dr. Sabra Heck on 10/28/2018 for further details of the assessment and plan.

## 2018-10-28 NOTE — ED Provider Notes (Signed)
[facility] EMERGENCY DEPARTMENT Provider Note   CSN: [id number] Arrival date & time: 10/27/18  2252     History   Chief Complaint Chief Complaint  Patient presents with   Leg Pain    HPI Keith [name] is a 53 y.o. male.     The history is provided by the patient.  Leg Pain Location:  Leg Leg location:  R leg Pain details:    Quality:  Aching   Severity:  Moderate   Onset quality:  Gradual   Timing:  Constant Chronicity:  New Relieved by:  Nothing Worsened by:  Nothing Associated symptoms: no fever    Patient with history of HIV, diabetes, hypertension, right BKA presents with right leg pain.  He reports that his right leg stump is infected.  He has been seen by his orthopedist for this, but it is worsening.  He reports a foul smell. No fevers or vomiting. Patient denies any leg trauma Past Medical History:  Diagnosis Date   Abscess of right foot    AIDS ([place])    Chronic knee pain    right   Chronic pain    Diabetes mellitus without complication ([place])    Diabetes type 2, uncontrolled ([place])    HgA1c 17.6 (04/27/2010)   Diabetic foot ulcer ([place]) 01/2017   right foot   Erectile dysfunction    Genital warts    HIV (human immunodeficiency virus infection) ([place]) 2009   CD4 count 100, VL 13800 (05/01/2010)   Hypertension    Osteomyelitis ([place])    h/o hand   Osteomyelitis of metatarsal ([place]) 04/28/2017    Patient Active Problem List   Diagnosis Date Noted   Type II diabetes mellitus with renal manifestations ([place]) 08/07/2018   Uncontrolled type 2 diabetes mellitus with hyperosmolar nonketotic hyperglycemia ([place]) 08/07/2018   Non-pressure chronic ulcer of right calf limited to breakdown of skin ([place]) 07/06/2018   Type 2 diabetes mellitus without complication, without long-term current use of insulin ([place]) 06/15/2018   Chronic low back pain without sciatica 06/15/2018   Wound of left leg 06/15/2018   Idiopathic chronic  venous hypertension of left lower extremity with ulcer and inflammation (HCC)    Venous stasis ulcer of left calf limited to breakdown of skin without varicose veins ([place]) 04/23/2018   Acquired absence of right leg below knee ([place]) 06/13/2017   Acute renal failure superimposed on stage 3 chronic kidney disease ([place]) 03/15/2017   Diabetic polyneuropathy associated with type 2 diabetes mellitus ([place]) 01/23/2017   AKI (acute kidney injury) ([place]) 09/28/2016   Nausea vomiting and diarrhea 09/28/2016   Onychomycosis of multiple toenails with type 2 diabetes mellitus ([place]) 08/29/2015   MRSA carrier 04/20/2014   Penile wart 02/22/2014   HIV disease ([place])    Insulin-requiring or dependent type II diabetes mellitus ([place]) 02/04/2014   Dental anomaly 11/20/2012   Arthritis of right knee 02/23/2012   Hyperlipidemia 11/10/2011   Chronic pain 08/07/2011   Meralgia paraesthetica 04/23/2011   ERECTILE DYSFUNCTION 08/22/2008   Essential hypertension 05/19/2008    Past Surgical History:  Procedure Laterality Date   AMPUTATION Right 05/02/2017   Procedure: AMPUTATION TRANSMETARSAL;  Surgeon: [name], MD;  Location: [place];  Service: Orthopedics;  Laterality: Right;   AMPUTATION Right 06/13/2017   Procedure: RIGHT BELOW KNEE AMPUTATION;  Surgeon: [name], MD;  Location: [place];  Service: Orthopedics;  Laterality: Right;   BELOW KNEE LEG AMPUTATION Right 06/13/2017   BELOW KNEE  LEG AMPUTATION     HERNIA REPAIR     I&D EXTREMITY Left 08/21/2014   Procedure: INCISION AND DRAINAGE LEFT SMALL FINGER;  Surgeon: Leanora Cover, MD;  Location: [place];  Service: Orthopedics;  Laterality: Left;   I&D EXTREMITY Right 03/18/2017   Procedure: IRRIGATION AND DEBRIDEMENT EXTREMITY;  Surgeon: [name], MD;  Location: [place];  Service: Orthopedics;  Laterality: Right;   MINOR IRRIGATION AND DEBRIDEMENT OF WOUND Right 04/22/2014   Procedure: IRRIGATION AND DEBRIDEMENT OF RIGHT NECK  ABCESS;  Surgeon: Jerrell Belfast, MD;  Location: [place];  Service: ENT;  Laterality: Right;   MULTIPLE EXTRACTIONS WITH ALVEOLOPLASTY N/A 01/18/2013   Procedure: MULTIPLE EXTRACION 3, 6, 7, 10, 11, 13, 21, 22, 27, 28, 29, 30 WITH ALVEOLOPLASTY;  Surgeon: Gae Bon, DDS;  Location: [place];  Service: Oral Surgery;  Laterality: N/A;   SKIN SPLIT GRAFT Right 03/21/2017   Procedure: IRRIGATION AND DEBRIDEMENT RIGHT FOOT AND APPLY SPLIT THICKNESS SKIN GRAFT AND WOUND VAC;  Surgeon: [name], MD;  Location: [place];  Service: Orthopedics;  Laterality: Right;   TEE WITHOUT CARDIOVERSION N/A 05/02/2017   Procedure: TRANSESOPHAGEAL ECHOCARDIOGRAM (TEE);  Surgeon: Acie Fredrickson Wonda Cheng, MD;  [facility] OR;  Service: Cardiovascular;  Laterality: N/A;  coincidental to orthopedic case        Home Medications    Prior to Admission medications   Medication Sig Start Date End Date Taking? Authorizing Provider  acetaminophen (TYLENOL) 325 MG tablet Take 2 tablets (650 mg total) by mouth every 6 (six) hours as needed. Patient taking differently: Take 650 mg by mouth every 6 (six) hours as needed.  08/08/18 08/08/19  Mercy Riding, MD  amLODipine (NORVASC) 10 MG tablet Take 1 tablet (10 mg total) by mouth daily. 07/09/18   [name], FNP  bictegravir-emtricitabine-tenofovir AF (BIKTARVY) 50-200-25 MG TABS tablet Take 1 tablet by mouth daily. 10/15/18   [name], MD  blood glucose meter kit and supplies Dispense based on patient and insurance preference. Use up to four times daily as directed. (FOR ICD-10 E10.9, E11.9). 07/31/18   [name], FNP  buprenorphine (BUTRANS) 20 MCG/HR PTWK patch Place 1 patch onto the skin. 08/14/18   [provider]  cephALEXin (KEFLEX) 250 MG capsule Take 1 capsule (250 mg total) by mouth 4 (four) times daily. 10/25/18   Hayden Rasmussen, MD  doxycycline (ADOXA) 100 MG tablet Take 1 tablet (100 mg total) by mouth 2 (two) times daily. 10/22/18   Aundra Dubin, PA-C  EASY COMFORT PEN NEEDLES 31G X 5 MM MISC INJECT 20 UNITS DAILY AS DIRECTED. 10/19/18   [name], FNP  escitalopram (LEXAPRO) 20 MG tablet Take 20 mg by mouth daily. 07/15/18   [provider]  glipiZIDE (GLUCOTROL) 10 MG tablet Take 1 tablet (10 mg total) by mouth 2 (two) times daily before a meal. 09/14/18   [name], FNP  glucose blood (COOL BLOOD GLUCOSE TEST STRIPS) test strip Use as instructed 01/05/18   [name], FNP  Insulin Glargine (LANTUS SOLOSTAR) 100 UNIT/ML Solostar Pen Inject 30 Units into the skin daily. 09/14/18   [name], FNP  insulin lispro (HUMALOG) 100 UNIT/ML injection Inject 10 units under skin 3 times a day, with meals, for blood glucose levels greater than 125. 09/14/18 09/14/19  [name], FNP  Insulin Syringes, Disposable, U-100 0.3 ML MISC 1 Syringe by Does not apply route 3 (three) times  daily. 09/22/18   [name], FNP  Lancets [facility] ULTRASOFT) lancets Use as instructed 01/05/18   [name], FNP  metFORMIN (GLUCOPHAGE) 1000 MG tablet Take 1 tablet (1,000 mg total) by mouth 2 (two) times daily with a meal. Patient taking differently: Take 1,000 mg by mouth daily.  09/14/18   [name], FNP  mupirocin ointment (BACTROBAN) 2 % Place 1 application into the nose 2 (two) times daily. Patient not taking: Reported on 10/15/2018 07/10/18   Rayburn, Neta Mends, PA-C  mupirocin ointment (BACTROBAN) 2 % Place 1 application into the nose 2 (two) times daily. Patient not taking: Reported on 10/25/2018 10/22/18   Aundra Dubin, PA-C  oxybutynin (DITROPAN) 5 MG tablet TAKE 1 TABLET (5 MG TOTAL) BY MOUTH 2 (TWO) TIMES DAILY. 10/19/18   [name], FNP  Oxycodone HCl 10 MG TABS Take 10 mg by mouth 4 (four) times daily. 07/09/18   [provider]  tadalafil (ADCIRCA/CIALIS) 20 MG tablet Take 0.5-1 tablets (10-20 mg total) by mouth every other day as needed for erectile dysfunction. 11/05/17    [name], FNP    Family History Family History  Problem Relation Age of Onset   Hypertension Mother    Arthritis Father    Hypertension Father    Hypertension Brother    Cancer Maternal Grandmother 49       unknown type of cancer   Depression Paternal Grandmother     Social History Social History   Tobacco Use   Smoking status: Never Smoker   Smokeless tobacco: Never Used  Substance Use Topics   Alcohol use: Never    Frequency: Never   Drug use: Never    Comment: OTC ASA     Allergies   Sulfur and Sulfa antibiotics   Review of Systems Review of Systems  Constitutional: Negative for fever.  Skin: Positive for wound.  All other systems reviewed and are negative.    Physical Exam Updated Vital Signs BP (!) 193/101    Pulse 76    Temp 98.9 F (37.2 C) (Oral)    Resp 19    Ht 1.753 m (_0 )    Wt 113.4 kg    SpO2 98%    BMI 36.92 kg/m   Physical Exam CONSTITUTIONAL: Well developed/well nourished HEAD: Normocephalic/atraumatic EYES: EOMI ENMT: Mucous membranes moist NECK: supple no meningeal signs SPINE/BACK:entire spine nontender CV: S1/S2 noted, no murmurs/rubs/gallops noted LUNGS: Lungs are clear to auscultation bilaterally, no apparent distress ABDOMEN: soft, nontender, no rebound or guarding, bowel sounds noted throughout abdomen GU:no cva tenderness NEURO: Pt is awake/alert/appropriate, moves all extremitiesx4.  No facial droop.   EXTREMITIES: pulses normal/equal, full ROM, see photo below.  No crepitus noted to right BKA stump No induration.  There is warmth noted SKIN: warm, color normal PSYCH: no abnormalities of mood noted, alert and oriented to situation   ED Treatments / Results  Labs  Patient gave verbal permission to utilize photo for medical documentation only The image was not stored on any personal device   (all labs ordered are listed, but only abnormal results are displayed) Labs Reviewed  CBC - Abnormal;  Notable for the following components:      Result Value   RBC 3.86 (*)    Hemoglobin 9.9 (*)    HCT 32.3 (*)    MCH 25.6 (*)    All other components within normal limits  BASIC METABOLIC PANEL - Abnormal; Notable for the following components:  Sodium 126 (*)    Chloride 92 (*)    Glucose, Bld 653 (*)    Creatinine, Ser 1.82 (*)    Calcium 8.8 (*)    GFR calc non Af Amer 41 (*)    GFR calc Af Amer 48 (*)    All other components within normal limits  CBG MONITORING, ED - Abnormal; Notable for the following components:   Glucose-Capillary 500 (*)    All other components within normal limits    EKG None  Radiology No results found.  Procedures .Critical Care Performed by: Ripley Fraise, MD Authorized by: Ripley Fraise, MD   Critical care provider statement:    Critical care time (minutes):  40   Critical care start time:  10/28/2018 4:00 AM   Critical care end time:  10/28/2018 4:40 AM   Critical care time was exclusive of:  Separately billable procedures and treating other patients   Critical care was necessary to treat or prevent imminent or life-threatening deterioration of the following conditions:  Sepsis and endocrine crisis   Critical care was time spent personally by me on the following activities:  Discussions with consultants, evaluation of patient's response to treatment, examination of patient, re-evaluation of patient's condition, ordering and review of laboratory studies, ordering and performing treatments and interventions and review of old charts     Medications Ordered in ED Medications  vancomycin (VANCOCIN) 1,500 mg in sodium chloride 0.9 % 500 mL IVPB (1,500 mg Intravenous New Bag/Given 10/28/18 0410)  bictegravir-emtricitabine-tenofovir AF (BIKTARVY) 50-200-25 MG per tablet 1 tablet (has no administration in time range)  amLODipine (NORVASC) tablet 10 mg (has no administration in time range)  buprenorphine (BUTRANS) 20 MCG/HR patch 1 patch (has no  administration in time range)  Oxycodone HCl TABS 10 mg (has no administration in time range)  escitalopram (LEXAPRO) tablet 20 mg (has no administration in time range)  oxybutynin (DITROPAN) tablet 5 mg (has no administration in time range)  dextrose 5 %-0.45 % sodium chloride infusion (has no administration in time range)  insulin regular bolus via infusion 0-10 Units (has no administration in time range)  insulin regular, human (MYXREDLIN) 100 units/ 100 mL infusion (has no administration in time range)  dextrose 50 % solution 25 mL (has no administration in time range)  0.9 %  sodium chloride infusion (has no administration in time range)  sodium chloride 0.9 % bolus 1,000 mL (0 mLs Intravenous Stopped 10/28/18 0446)  insulin aspart (novoLOG) injection 10 Units (10 Units Subcutaneous Given 10/28/18 0345)     Initial Impression / Assessment and Plan / ED Course  I have reviewed the triage vital signs and the nursing notes.  Pertinent labs  results that were available during my care of the patient were reviewed by me and considered in my medical decision making (see chart for details).        4:38 AM Patient presents with multiple issues.  His glucose is over 600, but no signs of DKA. He also has an ongoing infection in his right BKA stump.  Patient has macerated skin with overlying cellulitis with significant moisture.  No crepitus to suggest a deep space infection. Per his records, he has already been seen by orthopedics for this and was placed on mupirocin as well as doxycycline. He is also on antibiotics/keflex for a separate infection of his finger. Patient will require admission for glucose control, as well as wound care. 4:49 AM Discussed with Dr. Alcario Drought for admission Final Clinical Impressions(s) /  ED Diagnoses   Final diagnoses:  Cellulitis of right lower extremity  Hyperglycemia    ED Discharge Orders    None       Ripley Fraise, MD 10/28/18 [phone]

## 2018-10-28 NOTE — ED Notes (Signed)
Pt verbally abusive toward this RN. Upset that I will not let him swab himself for COVID. Requesting to see charge RN and doctor. Both notified.

## 2018-10-28 NOTE — ED Notes (Signed)
PT asking for change for drink. PT notified he has a high sugar and should not eat or drink.

## 2018-10-28 NOTE — Progress Notes (Signed)
Inpatient Diabetes Program Recommendations  AACE/ADA: New Consensus Statement on Inpatient Glycemic Control (2015)  Target Ranges:  Prepandial:   less than 140 mg/dL      Peak postprandial:   less than 180 mg/dL (1-2 hours)      Critically ill patients:  140 - 180 mg/dL   Results for [name], [name] (MRN [id number]) as of 10/28/2018 08:58  Ref. Range 06/06/2018 09:38 08/07/2018 09:29 09/07/2018 10:56  Hemoglobin A1C Latest Ref Range: 4.8 - 5.6 % 7.8 (H) 15.0 (H) >15.5 (H)   Review of Glycemic Control  Diabetes history: DM 2 Outpatient Diabetes medications: Glipizide 10 mg bid, Lantus 30 units Daily, Humalog 10 units tid with meals, Metformin 1000 mg Daily Current orders for Inpatient glycemic control: IV insulin gtt  Inpatient Diabetes Program Recommendations:    At time of transition consider Lantus 30 units, Novolog 0-15 units tid + Novolog 0-5 units qhs  DM Coordinator spoke with patient on 08/07/18 regarding A1c of 15%. Noted he had stopped taking Lantus at his PCP visit in March. Patient agreed on 5/1 that he will need to start taking it again. Based on A1c results on 6/1 and glucose trends on presentation to the ED, it does not look like patient has been taking his Lantus.   Thanks,  Tama Headings RN, MSN, BC-ADM Inpatient Diabetes Coordinator Team Pager [phone] (8a-5p)

## 2018-10-28 NOTE — Progress Notes (Signed)
Per MD orders, administer lantus and continue insulin gtt at 0.1 for the next 2 hours. Continue to complete POCT CBG hourly.

## 2018-10-28 NOTE — ED Notes (Signed)
ED TO INPATIENT HANDOFF REPORT  ED Nurse Name and Phone #: [phone] Threasa Beards, RN  S Name/Age/Gender Keith [name] 53 y.o. male Room/Bed: 024C/024C  Code Status   Code Status: Full Code  Home/SNF/Other Home Patient oriented to: self, place, time and situation Is this baseline? Yes   Triage Complete: Triage complete  Chief Complaint R Leg Pain/Possible sepsis  Triage Note Pt in POV, reports infection to R stump, has worsened over the past few weeks. States he was told by Dr Sharol Given to come to ED for eval. Hx DM.    Allergies Allergies  Allergen Reactions  . Sulfur Itching    Patient stated he's allergic to "sulfur" AND "sulfa"  . Sulfa Antibiotics Itching    Level of Care/Admitting Diagnosis ED Disposition    ED Disposition Condition [facility] [place] [100100]  Level of Care: Progressive [102]  Covid Evaluation: Asymptomatic Screening Protocol (No Symptoms)  Diagnosis: Amputation stump infection [facility]) [[id number]]  Admitting Physician: Doreatha Massed  Attending Physician: Etta Quill [phone]  Estimated length of stay: past midnight tomorrow  Certification:: I certify this patient will need inpatient services for at least 2 midnights  PT Class (Do Not Modify): Inpatient [101]  PT Acc Code (Do Not Modify): Private [1]       B Medical/Surgery History Past Medical History:  Diagnosis Date  . Abscess of right foot   . AIDS ([place])   . Chronic knee pain    right  . Chronic pain   . Diabetes mellitus without complication ([place])   . Diabetes type 2, uncontrolled ([place])    HgA1c 17.6 (04/27/2010)  . Diabetic foot ulcer ([place]) 01/2017   right foot  . Erectile dysfunction   . Genital warts   . HIV (human immunodeficiency virus infection) ([place]) 2009   CD4 count 100, VL 13800 (05/01/2010)  . Hypertension   . Osteomyelitis ([place])    h/o hand  . Osteomyelitis of metatarsal ([place]) 04/28/2017   Past Surgical History:   Procedure Laterality Date  . AMPUTATION Right 05/02/2017   Procedure: AMPUTATION TRANSMETARSAL;  Surgeon: [name], MD;  Location: [place];  Service: Orthopedics;  Laterality: Right;  . AMPUTATION Right 06/13/2017   Procedure: RIGHT BELOW KNEE AMPUTATION;  Surgeon: [name], MD;  Location: [place];  Service: Orthopedics;  Laterality: Right;  . BELOW KNEE LEG AMPUTATION Right 06/13/2017  . BELOW KNEE LEG AMPUTATION    . HERNIA REPAIR    . I&D EXTREMITY Left 08/21/2014   Procedure: INCISION AND DRAINAGE LEFT SMALL FINGER;  Surgeon: Leanora Cover, MD;  Location: [place];  Service: Orthopedics;  Laterality: Left;  . I&D EXTREMITY Right 03/18/2017   Procedure: IRRIGATION AND DEBRIDEMENT EXTREMITY;  Surgeon: [name], MD;  Location: [place];  Service: Orthopedics;  Laterality: Right;  . MINOR IRRIGATION AND DEBRIDEMENT OF WOUND Right 04/22/2014   Procedure: IRRIGATION AND DEBRIDEMENT OF RIGHT NECK ABCESS;  Surgeon: Jerrell Belfast, MD;  Location: [place];  Service: ENT;  Laterality: Right;  . MULTIPLE EXTRACTIONS WITH ALVEOLOPLASTY N/A 01/18/2013   Procedure: MULTIPLE EXTRACION 3, 6, 7, 10, 11, 13, 21, 22, 27, 28, 29, 30 WITH ALVEOLOPLASTY;  Surgeon: Gae Bon, DDS;  Location: [place];  Service: Oral Surgery;  Laterality: N/A;  . SKIN SPLIT GRAFT Right 03/21/2017   Procedure: IRRIGATION AND DEBRIDEMENT RIGHT FOOT AND APPLY SPLIT THICKNESS SKIN GRAFT AND WOUND VAC;  Surgeon: [name], MD;  Location: [place];  Service: Orthopedics;  Laterality: Right;  . TEE WITHOUT CARDIOVERSION N/A 05/02/2017   Procedure: TRANSESOPHAGEAL ECHOCARDIOGRAM (TEE);  Surgeon: Thayer Headings, MD;  Location: [facility] OR;  Service: Cardiovascular;  Laterality: N/A;  coincidental to orthopedic case     A IV Location/Drains/Wounds Patient Lines/Drains/Airways Status   Active Line/Drains/Airways    Name:   Placement date:   Placement time:   Site:   Days:   Peripheral IV 10/28/18 Right Antecubital   10/28/18    0340     Antecubital   less than 1   Incision (Closed) 03/18/17 Foot   03/18/17    1030     589   Incision (Closed) 03/21/17 Foot Right   03/21/17    1026     586   Incision (Closed) Foot Anterior;Right   -    -        Incision (Closed) 05/02/17 Foot Right   05/02/17    1311     544   Incision (Closed) 06/13/17 Leg Right   06/13/17    1527     502   Wound / Incision (Open or Dehisced) 04/29/17 Diabetic ulcer Foot Right purlent drainage   04/29/17    1102    Foot   547   Wound / Incision (Open or Dehisced) 04/29/17 Diabetic ulcer Heel Right   04/29/17    1100    Heel   547   Wound / Incision (Open or Dehisced) 08/07/18 Non-pressure wound Leg Right proxiaml wound, open   08/07/18    0935    Leg   82   Wound / Incision (Open or Dehisced) 08/07/18 Non-pressure wound Leg Right distal, healing wound   08/07/18    0935    Leg   82          Intake/Output Last 24 hours No intake or output data in the 24 hours ending 10/28/18 0531  Labs/Imaging Results for orders placed or performed during the hospital encounter of 10/27/18 (from the past 48 hour(s))  CBC     Status: Abnormal   Collection Time: 10/27/18 11:55 PM  Result Value Ref Range   WBC 6.4 4.0 - 10.5 K/uL   RBC 3.86 (L) 4.22 - 5.81 MIL/uL   Hemoglobin 9.9 (L) 13.0 - 17.0 g/dL   HCT 32.3 (L) 39.0 - 52.0 %   MCV 83.7 80.0 - 100.0 fL   MCH 25.6 (L) 26.0 - 34.0 pg   MCHC 30.7 30.0 - 36.0 g/dL   RDW 12.5 11.5 - 15.5 %   Platelets 285 150 - 400 K/uL   nRBC 0.0 0.0 - 0.2 %    Comment: Performed at [facility], [place] [address], [place], [place] [address]  Basic metabolic panel     Status: Abnormal   Collection Time: 10/27/18 11:55 PM  Result Value Ref Range   Sodium 126 (L) 135 - 145 mmol/L   Potassium 3.8 3.5 - 5.1 mmol/L   Chloride 92 (L) 98 - 111 mmol/L   CO2 22 22 - 32 mmol/L   Glucose, Bld 653 (HH) 70 - 99 mg/dL    Comment: CRITICAL RESULT CALLED TO, READ BACK BY AND VERIFIED WITH: STRAUGHAN C,RN 10/28/18 0034 WAYK    BUN 19 6 - 20  mg/dL   Creatinine, Ser 1.82 (H) 0.61 - 1.24 mg/dL   Calcium 8.8 (L) 8.9 - 10.3 mg/dL   GFR calc non Af Amer 41 (L) >60 mL/min  GFR calc Af Amer 48 (L) >60 mL/min   Anion gap 12 5 - 15    Comment: Performed at [facility] [address], [place], [place] [address]  CBG monitoring, ED     Status: Abnormal   Collection Time: 10/28/18  3:38 AM  Result Value Ref Range   Glucose-Capillary 500 (H) 70 - 99 mg/dL  CBG monitoring, ED     Status: Abnormal   Collection Time: 10/28/18  5:05 AM  Result Value Ref Range   Glucose-Capillary 455 (H) 70 - 99 mg/dL   No results found.  Pending Labs [employer] (From admission, onward)    Start     Ordered   10/28/18 7371  Basic metabolic panel  Once-Timed,   STAT     10/28/18 0452   10/28/18 0448  Aerobic Culture (superficial specimen)  Once,   STAT     10/28/18 0448   10/28/18 0444  SARS Coronavirus 2 (CEPHEID - Performed in [facility] hospital lab), Hosp Order  (Asymptomatic Patients Labs)  Once,   STAT    Question:  Rule Out  Answer:  Yes   10/28/18 0443          Vitals/Pain Today's Vitals   10/28/18 0345 10/28/18 0400 10/28/18 0430 10/28/18 0445  BP: (!) 151/88 (!) 191/96 (!) 193/101 (!) 189/105  Pulse:  72 76   Resp:      Temp:      TempSrc:      SpO2:  97% 98%   Weight:      Height:      PainSc:        Isolation Precautions No active isolations  Medications Medications  vancomycin (VANCOCIN) 1,500 mg in sodium chloride 0.9 % 500 mL IVPB (1,500 mg Intravenous New Bag/Given 10/28/18 0410)  bictegravir-emtricitabine-tenofovir AF (BIKTARVY) 50-200-25 MG per tablet 1 tablet (has no administration in time range)  amLODipine (NORVASC) tablet 10 mg (has no administration in time range)  buprenorphine (BUTRANS) 20 MCG/HR patch 1 patch (has no administration in time range)  escitalopram (LEXAPRO) tablet 20 mg (has no administration in time range)  oxybutynin (DITROPAN) tablet 5 mg (has no administration in time range)   dextrose 5 %-0.45 % sodium chloride infusion (has no administration in time range)  insulin regular bolus via infusion 0-10 Units (has no administration in time range)  insulin regular, human (MYXREDLIN) 100 units/ 100 mL infusion (4 Units/hr Intravenous New Bag/Given 10/28/18 0514)  dextrose 50 % solution 25 mL (has no administration in time range)  0.9 %  sodium chloride infusion (has no administration in time range)  acetaminophen (TYLENOL) tablet 650 mg (has no administration in time range)    Or  acetaminophen (TYLENOL) suppository 650 mg (has no administration in time range)  ondansetron (ZOFRAN) tablet 4 mg (has no administration in time range)    Or  ondansetron (ZOFRAN) injection 4 mg (has no administration in time range)  enoxaparin (LOVENOX) injection 40 mg (has no administration in time range)  oxyCODONE (Oxy IR/ROXICODONE) immediate release tablet 10 mg (has no administration in time range)  potassium chloride SA (K-DUR) CR tablet 20 mEq (has no administration in time range)  vancomycin (VANCOCIN) 1,500 mg in sodium chloride 0.9 % 500 mL IVPB (has no administration in time range)  hydrALAZINE (APRESOLINE) injection 10-20 mg (has no administration in time range)  sodium chloride 0.9 % bolus 1,000 mL (0 mLs Intravenous Stopped 10/28/18 0446)  insulin aspart (novoLOG) injection 10 Units (10  Units Subcutaneous Given 10/28/18 0345)    Mobility walks with device Low fall risk   Focused Assessments Neuro Assessment Handoff:  Swallow screen pass?          Neuro Assessment: Within Defined Limits Neuro Checks:      Last Documented NIHSS Modified Score:   Has TPA been given? No If patient is a Neuro Trauma and patient is going to OR before floor call report to [place] nurse: [phone] or [phone]     R Recommendations: See Admitting Provider Note  Report given to:   Additional Notes:

## 2018-10-28 NOTE — Progress Notes (Signed)
Pharmacy Antibiotic Note  Keith [name] is a 53 y.o. male admitted on 10/27/2018 with R-BKA stump infection.  Pharmacy has been consulted for Vancomycin dosing. Failed outpatient PO anti-biotics. WBC WNL. Noted renal dysfunction.   Plan: Vancomycin 1500 mg IV q24h >>Estimated AUC: 504 Trend WBC, temp, renal function  F/U infectious work-up Drug levels as indicated   Height: 5\' 9"  (175.3 cm) Weight: 250 lb (113.4 kg) IBW/kg (Calculated) : 70.7  Temp (24hrs), Avg:98.9 F (37.2 C), Min:98.9 F (37.2 C), Max:98.9 F (37.2 C)  Recent Labs  Lab 10/25/18 0410 10/25/18 0953 10/27/18 2355  WBC 10.3  --  6.4  CREATININE 2.05* 1.85* 1.82*  LATICACIDVEN 1.3  --   --     Estimated Creatinine Clearance: 58.3 mL/min (A) (by C-G formula based on SCr of 1.82 mg/dL (H)).    Allergies  Allergen Reactions  . Sulfur Itching    Patient stated he's allergic to "sulfur" AND "sulfa"  . Sulfa Antibiotics Itching    [name], PharmD, BCPS Clinical Pharmacist Phone: [phone]

## 2018-10-29 LAB — BASIC METABOLIC PANEL
Anion gap: 7 (ref 5–15)
BUN: 11 mg/dL (ref 6–20)
CO2: 24 mmol/L (ref 22–32)
Calcium: 9 mg/dL (ref 8.9–10.3)
Chloride: 104 mmol/L (ref 98–111)
Creatinine, Ser: 1.2 mg/dL (ref 0.61–1.24)
GFR calc Af Amer: >60 mL/min (ref >60)
GFR calc non Af Amer: >60 mL/min (ref >60)
Glucose, Bld: 244 mg/dL — ABNORMAL HIGH (ref 70–99)
Potassium: 4.1 mmol/L (ref 3.5–5.1)
Sodium: 135 mmol/L (ref 135–145)

## 2018-10-29 LAB — MRSA PCR SCREENING: MRSA by PCR: POSITIVE — AB

## 2018-10-29 LAB — GLUCOSE, CAPILLARY
Glucose-Capillary: 254 mg/dL — ABNORMAL HIGH (ref 70–99)
Glucose-Capillary: 282 mg/dL — ABNORMAL HIGH (ref 70–99)
Glucose-Capillary: 186 mg/dL — ABNORMAL HIGH (ref 70–99)
Glucose-Capillary: 224 mg/dL — ABNORMAL HIGH (ref 70–99)

## 2018-10-29 MED ORDER — MUPIROCIN 2 % EX OINT
1.0000 "application " | TOPICAL_OINTMENT | Freq: Two times a day (BID) | CUTANEOUS | Status: DC
  Administered 2018-10-30 – 2018-11-02 (×9): 1 via NASAL
  Filled 2018-10-29: qty 22

## 2018-10-29 MED ORDER — CHLORHEXIDINE GLUCONATE CLOTH 2 % EX PADS
6.0000 | MEDICATED_PAD | Freq: Every day | CUTANEOUS | Status: AC
  Administered 2018-10-30 – 2018-11-03 (×5): 6 via TOPICAL

## 2018-10-29 MED ORDER — PROMETHAZINE HCL 25 MG/ML IJ SOLN
12.5000 mg | Freq: Four times a day (QID) | INTRAMUSCULAR | Status: DC | PRN
  Administered 2018-10-29: 12.5 mg via INTRAVENOUS
  Filled 2018-10-29: qty 1

## 2018-10-29 NOTE — Progress Notes (Signed)
PT Cancellation Note  Patient Details Name: Keith [name] MRN: [id number] DOB: Jan 15, 1966   Cancelled Treatment:    Reason Eval/Treat Not Completed: Patient declined, no reason specified- reports nausea. Attempted to see x 2 this morning. Will re-attempt later today if time allows.      Shontelle Muska 10/29/2018, 12:44 PM

## 2018-10-29 NOTE — Progress Notes (Signed)
Inpatient Diabetes Program Recommendations  AACE/ADA: New Consensus Statement on Inpatient Glycemic Control (2015)  Target Ranges:  Prepandial:   less than 140 mg/dL      Peak postprandial:   less than 180 mg/dL (1-2 hours)      Critically ill patients:  140 - 180 mg/dL   Lab Results  Component Value Date   GLUCAP 282 (H) 10/29/2018   HGBA1C >15.5 (H) 09/07/2018    Review of Glycemic Control  FBS 254, 244. Post-prandial 282.  On meal coverage insulin at home (humalog 10 units tidwc)  Inpatient Diabetes Program Recommendations:     Increase Lantus to 30 units QD Add Novolog 6 units tidwc for meal coverage insulin.  DM Coordinator spoke with pt regarding his HgbA1C of 15% on 08/07/18, and pt stated he was not taking his Lantus. He agreed to start taking again.  Will continue to follow.  Thank you. Lorenda Peck, RD, LDN, CDE Inpatient Diabetes Coordinator [phone]

## 2018-10-29 NOTE — Progress Notes (Addendum)
PROGRESS NOTE  Keith [name] PNT:[id number] DOB: [birth year] DOA: 10/27/2018 PCP: System, Pcp Not In  HPI/Recap of past 24 hours: Keith [name] a 53 y.o.malewith medical history significant ofDM2, HIV, HTN, s/p R BKA.  Patient presents to the ED with ongoing ulcer and foul smelling drainage from his R BKA stump. Seen recently by [name] on 7/16 for this. Started on doxycycline, but symptoms persisted / worsened. Seen again in ED on 7/19 and started on Keflex. Symptoms again persisted and worsened with increased foul smelling drainage. Denies any leg trauma.  No fevers, no vomiting.  10/29/18: Patient was seen and examined at his bedside this morning.  He has no new complaints.  Later this morning RN reports patient is nauseous with no improvement with IV Zofran.  IV Phenergan added for refractory nausea and vomiting.  Assessment/Plan: Principal Problem:   Amputation stump infection ([place]) Active Problems:   Essential hypertension   HIV disease ([place])   Acquired absence of right leg below knee (H[facility])   Type II diabetes mellitus with renal manifestations ([place])  Right lower extremity stump infection and pressure ulcer Failed outpatient antibiotics Positive MRSA screening in 08/07/2018 Continue IV vancomycin Wound care specialist saw the patient and recommended dressing changes twice a day If no improvement will consult [name] in the morning  Intractable nausea, possibly related to uncontrolled hyperglycemia IV Zofran for nausea IV Phenergan for refractory nausea and vomiting  Uncontrolled hypertension Currently on amlodipine 10 mg daily We will reduce rate of IV fluid down to 75 cc/h If no improvement may consider adding a second agent for his hypertension Continue to monitor vital signs  AKI on CKD 2 Baseline creatinine appears to be 1.2 Patient is currently at his baseline creatinine Presented with creatinine of 1.85 with GFR 47 Currently on normal saline at 125  cc/h Monitor urine output Avoid nephrotoxins  Type 2 diabetes with hyperglycemia Continue insulin coverage Continue IV fluid hydration  HIV  Continue HAART Continue dapsone prophylactically   Risks: Patient is high risk for decompensation due to stump infection with pressure ulcers, multiple comorbidities and age.  Patient will require at least 2 midnights for further evaluation and treatment of present condition.    DVT prophylaxis: Lovenox Code Status: Full Family Communication: No family in room Disposition Plan:  DC home possibly in 1 to 2 days when hemodynamically stable. Consults called:  Wound care specialist.   Objective: Vitals:   10/28/18 1100 10/28/18 1222 10/28/18 2116 10/29/18 0551  BP: (!) 151/85 (!) 152/89 (!) 163/98 (!) 170/97  Pulse: 78 85 79 80  Resp: 17 17    Temp:  98.7 F (37.1 C) 98.5 F (36.9 C) 98.5 F (36.9 C)  TempSrc:  Oral Oral Oral  SpO2: 100% 98% 100% 100%  Weight:    114.8 kg  Height:  5\' 9"  (1.753 m)      Intake/Output Summary (Last 24 hours) at 10/29/2018 1330 Last data filed at 10/29/2018 0867 Gross per 24 hour  Intake 1325.68 ml  Output 2525 ml  Net -1199.32 ml   Filed Weights   10/27/18 2350 10/29/18 0551  Weight: 113.4 kg 114.8 kg    Exam:  . General: 53 y.o. year-old male well developed well nourished in no acute distress.  Alert and oriented x3. . Cardiovascular: Regular rate and rhythm with no rubs or gallops.  No thyromegaly or JVD noted.   Marland Kitchen Respiratory: Clear to auscultation with no wheezes or rales. Good inspiratory effort. Marland Kitchen  Abdomen: Soft nontender nondistended with normal bowel sounds x4 quadrants. . Musculoskeletal: Right lower extremity stump with wound. Marland Kitchen Psychiatry: Mood is appropriate for condition and setting   Data Reviewed: CBC: Recent Labs  Lab 10/25/18 0410 10/27/18 2355  WBC 10.3 6.4  NEUTROABS 7.5  --   HGB 10.8* 9.9*  HCT 34.6* 32.3*  MCV 83.4 83.7  PLT 233 622   Basic Metabolic Panel:  Recent Labs  Lab 10/25/18 0953 10/27/18 2355 10/28/18 0900 10/28/18 1148 10/29/18 0801  NA 129* 126* 135 136 135  K 3.9 3.8 3.4* 3.5 4.1  CL 98 92* 101 102 104  CO2 23 22 24 26 24   GLUCOSE 427* 653* 164* 208* 244*  BUN 23* 19 17 17 11   CREATININE 1.85* 1.82* 1.43* 1.47* 1.20  CALCIUM 8.5* 8.8* 9.0 8.9 9.0   GFR: Estimated Creatinine Clearance: 88.9 mL/min (by C-G formula based on SCr of 1.2 mg/dL). Liver Function Tests: Recent Labs  Lab 10/25/18 0410  AST 18  ALT 13  ALKPHOS 121  BILITOT 0.6  PROT 8.3*  ALBUMIN 2.8*   No results for input(s): LIPASE, AMYLASE in the last 168 hours. No results for input(s): AMMONIA in the last 168 hours. Coagulation Profile: No results for input(s): INR, PROTIME in the last 168 hours. Cardiac Enzymes: No results for input(s): CKTOTAL, CKMB, CKMBINDEX, TROPONINI in the last 168 hours. BNP (last 3 results) No results for input(s): PROBNP in the last 8760 hours. HbA1C: No results for input(s): HGBA1C in the last 72 hours. CBG: Recent Labs  Lab 10/28/18 1428 10/28/18 1631 10/28/18 2112 10/29/18 0757 10/29/18 1137  GLUCAP 389* 346* 286* 254* 282*   Lipid Profile: No results for input(s): CHOL, HDL, LDLCALC, TRIG, CHOLHDL, LDLDIRECT in the last 72 hours. Thyroid Function Tests: No results for input(s): TSH, T4TOTAL, FREET4, T3FREE, THYROIDAB in the last 72 hours. Anemia Panel: No results for input(s): VITAMINB12, FOLATE, FERRITIN, TIBC, IRON, RETICCTPCT in the last 72 hours. Urine analysis:    Component Value Date/Time   COLORURINE YELLOW 10/25/2018 0605   APPEARANCEUR HAZY (A) 10/25/2018 0605   LABSPEC 1.017 10/25/2018 0605   PHURINE 5.0 10/25/2018 0605   GLUCOSEU >=500 (A) 10/25/2018 0605   HGBUR MODERATE (A) 10/25/2018 0605   BILIRUBINUR NEGATIVE 10/25/2018 0605   BILIRUBINUR negative 09/07/2018 1034   KETONESUR NEGATIVE 10/25/2018 0605   PROTEINUR 100 (A) 10/25/2018 0605   UROBILINOGEN 1.0 09/07/2018 1034    UROBILINOGEN 1.0 01/01/2017 1032   NITRITE NEGATIVE 10/25/2018 0605   LEUKOCYTESUR NEGATIVE 10/25/2018 0605   Sepsis Labs: @LABRCNTIP (procalcitonin:4,lacticidven:4)  ) Recent Results (from the past 240 hour(s))  SARS Coronavirus 2 (CEPHEID - Performed in [place] hospital lab), Hosp Order     Status: None   Collection Time: 10/25/18  7:50 AM   Specimen: Nasopharyngeal Swab  Result Value Ref Range Status   SARS Coronavirus 2 NEGATIVE NEGATIVE Final    Comment: (NOTE) If result is NEGATIVE SARS-CoV-2 target nucleic acids are NOT DETECTED. The SARS-CoV-2 RNA is generally detectable in upper and lower  respiratory specimens during the acute phase of infection. The lowest  concentration of SARS-CoV-2 viral copies this assay can detect is 250  copies / mL. A negative result does not preclude SARS-CoV-2 infection  and should not be used as the sole basis for treatment or other  patient management decisions.  A negative result may occur with  improper specimen collection / handling, submission of specimen other  than nasopharyngeal swab, presence of viral mutation(s) within  the  areas targeted by this assay, and inadequate number of viral copies  (<250 copies / mL). A negative result must be combined with clinical  observations, patient history, and epidemiological information. If result is POSITIVE SARS-CoV-2 target nucleic acids are DETECTED. The SARS-CoV-2 RNA is generally detectable in upper and lower  respiratory specimens dur ing the acute phase of infection.  Positive  results are indicative of active infection with SARS-CoV-2.  Clinical  correlation with patient history and other diagnostic information is  necessary to determine patient infection status.  Positive results do  not rule out bacterial infection or co-infection with other viruses. If result is PRESUMPTIVE POSTIVE SARS-CoV-2 nucleic acids MAY BE PRESENT.   A presumptive positive result was obtained on the  submitted specimen  and confirmed on repeat testing.  While 2019 novel coronavirus  (SARS-CoV-2) nucleic acids may be present in the submitted sample  additional confirmatory testing may be necessary for epidemiological  and / or clinical management purposes  to differentiate between  SARS-CoV-2 and other Sarbecovirus currently known to infect humans.  If clinically indicated additional testing with an alternate test  methodology [phone]) is advised. The SARS-CoV-2 RNA is generally  detectable in upper and lower respiratory sp ecimens during the acute  phase of infection. The expected result is Negative. Fact Sheet for Patients:  StrictlyIdeas.no Fact Sheet for Healthcare Providers: BankingDealers.co.za This test is not yet approved or cleared by the [place] FDA and has been authorized for detection and/or diagnosis of SARS-CoV-2 by FDA under an Emergency Use Authorization (EUA).  This EUA will remain in effect (meaning this test can be used) for the duration of the COVID-19 declaration under Section 564(b)(1) of the Act, 21 U.S.C. section 360bbb-3(b)(1), unless the authorization is terminated or revoked sooner. Performed at [facility], [place] [address], [place], [place] [address]   SARS Coronavirus 2 (CEPHEID - Performed in [place] hospital lab), Hosp Order     Status: None   Collection Time: 10/28/18  5:30 AM   Specimen: Nasopharyngeal Swab  Result Value Ref Range Status   SARS Coronavirus 2 NEGATIVE NEGATIVE Final    Comment: (NOTE) If result is NEGATIVE SARS-CoV-2 target nucleic acids are NOT DETECTED. The SARS-CoV-2 RNA is generally detectable in upper and lower  respiratory specimens during the acute phase of infection. The lowest  concentration of SARS-CoV-2 viral copies this assay can detect is 250  copies / mL. A negative result does not preclude SARS-CoV-2 infection  and should not be used as the sole basis  for treatment or other  patient management decisions.  A negative result may occur with  improper specimen collection / handling, submission of specimen other  than nasopharyngeal swab, presence of viral mutation(s) within the  areas targeted by this assay, and inadequate number of viral copies  (<250 copies / mL). A negative result must be combined with clinical  observations, patient history, and epidemiological information. If result is POSITIVE SARS-CoV-2 target nucleic acids are DETECTED. The SARS-CoV-2 RNA is generally detectable in upper and lower  respiratory specimens dur ing the acute phase of infection.  Positive  results are indicative of active infection with SARS-CoV-2.  Clinical  correlation with patient history and other diagnostic information is  necessary to determine patient infection status.  Positive results do  not rule out bacterial infection or co-infection with other viruses. If result is PRESUMPTIVE POSTIVE SARS-CoV-2 nucleic acids MAY BE PRESENT.   A presumptive positive result was obtained  on the submitted specimen  and confirmed on repeat testing.  While 2019 novel coronavirus  (SARS-CoV-2) nucleic acids may be present in the submitted sample  additional confirmatory testing may be necessary for epidemiological  and / or clinical management purposes  to differentiate between  SARS-CoV-2 and other Sarbecovirus currently known to infect humans.  If clinically indicated additional testing with an alternate test  methodology [phone]) is advised. The SARS-CoV-2 RNA is generally  detectable in upper and lower respiratory sp ecimens during the acute  phase of infection. The expected result is Negative. Fact Sheet for Patients:  StrictlyIdeas.no Fact Sheet for Healthcare Providers: BankingDealers.co.za This test is not yet approved or cleared by the [place] FDA and has been authorized for detection and/or  diagnosis of SARS-CoV-2 by FDA under an Emergency Use Authorization (EUA).  This EUA will remain in effect (meaning this test can be used) for the duration of the COVID-19 declaration under Section 564(b)(1) of the Act, 21 U.S.C. section 360bbb-3(b)(1), unless the authorization is terminated or revoked sooner. Performed at [facility], [place] [address] [place], [place] [address]       Studies: No results found.  Scheduled Meds: . amLODipine  10 mg Oral Daily  . bictegravir-emtricitabine-tenofovir AF  1 tablet Oral Daily  . buprenorphine  1 patch Transdermal Weekly  . collagenase   Topical BID  . dapsone  100 mg Oral Daily  . enoxaparin (LOVENOX) injection  40 mg Subcutaneous Daily  . escitalopram  20 mg Oral Daily  . insulin aspart  0-15 Units Subcutaneous TID WC  . insulin aspart  0-5 Units Subcutaneous QHS  . insulin glargine  20 Units Subcutaneous Daily  . oxybutynin  5 mg Oral BID  . potassium chloride  40 mEq Oral BID    Continuous Infusions: . sodium chloride 125 mL/hr at 10/29/18 1011  . vancomycin 1,500 mg (10/28/18 2140)     LOS: 1 day     Kayleen Memos, MD [facility] Pager [phone]  If 7PM-7AM, please contact night-coverage [email or web address] Password [email or web address] 10/29/2018, 1:30 PM

## 2018-10-29 NOTE — Progress Notes (Signed)
Patient refused to take 1000 meds; feeling nauseated and wants to wait until he eats breakfast. Will admin PRN zofran and continue to monitor.

## 2018-10-30 DIAGNOSIS — L89899 Pressure ulcer of other site, unspecified stage: Secondary | ICD-10-CM

## 2018-10-30 DIAGNOSIS — T8789 Other complications of amputation stump: Secondary | ICD-10-CM

## 2018-10-30 DIAGNOSIS — Z794 Long term (current) use of insulin: Secondary | ICD-10-CM

## 2018-10-30 DIAGNOSIS — T8740 Infection of amputation stump, unspecified extremity: Secondary | ICD-10-CM

## 2018-10-30 DIAGNOSIS — E1122 Type 2 diabetes mellitus with diabetic chronic kidney disease: Secondary | ICD-10-CM

## 2018-10-30 LAB — BASIC METABOLIC PANEL
Anion gap: 6 (ref 5–15)
BUN: 11 mg/dL (ref 6–20)
CO2: 25 mmol/L (ref 22–32)
Calcium: 8.8 mg/dL — ABNORMAL LOW (ref 8.9–10.3)
Chloride: 105 mmol/L (ref 98–111)
Creatinine, Ser: 1.15 mg/dL (ref 0.61–1.24)
GFR calc Af Amer: >60 mL/min (ref >60)
GFR calc non Af Amer: >60 mL/min (ref >60)
Glucose, Bld: 206 mg/dL — ABNORMAL HIGH (ref 70–99)
Potassium: 4.2 mmol/L (ref 3.5–5.1)
Sodium: 136 mmol/L (ref 135–145)

## 2018-10-30 LAB — GLUCOSE, CAPILLARY
Glucose-Capillary: 185 mg/dL — ABNORMAL HIGH (ref 70–99)
Glucose-Capillary: 132 mg/dL — ABNORMAL HIGH (ref 70–99)
Glucose-Capillary: 133 mg/dL — ABNORMAL HIGH (ref 70–99)
Glucose-Capillary: 180 mg/dL — ABNORMAL HIGH (ref 70–99)

## 2018-10-30 LAB — C-REACTIVE PROTEIN: CRP: 2 mg/dL — ABNORMAL HIGH (ref <1.0)

## 2018-10-30 LAB — SEDIMENTATION RATE: Sed Rate: 140 mm/hr — ABNORMAL HIGH (ref 0–16)

## 2018-10-30 MED ORDER — VANCOMYCIN HCL IN DEXTROSE 1-5 GM/200ML-% IV SOLN
1000.0000 mg | Freq: Two times a day (BID) | INTRAVENOUS | Status: DC
  Administered 2018-10-30 – 2018-11-02 (×7): 1000 mg via INTRAVENOUS
  Filled 2018-10-30 (×8): qty 200

## 2018-10-30 NOTE — Progress Notes (Signed)
Pharmacy Antibiotic Note  Keith [name] is a 53 y.o. male admitted on 10/27/2018 with R-BKA stump infection.  Pharmacy has been consulted for Vancomycin dosing. Failed outpatient PO anti-biotics. WBC WNL. Renal function back to normal.    Plan: Vancomycin 1g  IV q12h >>Estimated AUC: 514 F/U infectious work-up Drug levels as indicated   Height: 5\' 9"  (175.3 cm) Weight: 249 lb 1.9 oz (113 kg) IBW/kg (Calculated) : 70.7  Temp (24hrs), Avg:98.2 F (36.8 C), Min:98.1 F (36.7 C), Max:98.3 F (36.8 C)  Recent Labs  Lab 10/25/18 0410  10/27/18 2355 10/28/18 0900 10/28/18 1148 10/29/18 0801 10/30/18 0409  WBC 10.3  --  6.4  --   --   --   --   CREATININE 2.05*   < > 1.82* 1.43* 1.47* 1.20 1.15  LATICACIDVEN 1.3  --   --   --   --   --   --    < > = values in this interval not displayed.    Estimated Creatinine Clearance: 92 mL/min (by C-G formula based on SCr of 1.15 mg/dL).    Allergies  Allergen Reactions  . Sulfur Itching    Patient stated he's allergic to "sulfur" AND "sulfa"  . Sulfa Antibiotics Itching    Erin Hearing PharmD., BCPS Clinical Pharmacist 10/30/2018 1:53 PM

## 2018-10-30 NOTE — Care Management Important Message (Signed)
Important Message  Patient Details  Name: Keith [name] MRN: [id number] Date of Birth: Aug 20, 1965   Medicare Important Message Given:  Yes     Shelda Altes 10/30/2018, 11:22 AM

## 2018-10-30 NOTE — Consult Note (Signed)
ORTHOPAEDIC CONSULTATION  REQUESTING PHYSICIAN: Kayleen Memos, DO  Chief Complaint: Ulcer right transtibial amputation.  HPI: Keith [name] is a 53 y.o. male who presents with ulcer with abscess right transtibial amputation.  Patient states he was unaware that his leg could lose volume and that he is been weightbearing with an bearing on the residual limb.  Patient states he is also lost a great deal of body weight through training.  Past Medical History:  Diagnosis Date  . Abscess of right foot   . AIDS ([place])   . Chronic knee pain    right  . Chronic pain   . Diabetes mellitus without complication ([place])   . Diabetes type 2, uncontrolled ([place])    HgA1c 17.6 (04/27/2010)  . Diabetic foot ulcer ([place]) 01/2017   right foot  . Erectile dysfunction   . Genital warts   . HIV (human immunodeficiency virus infection) ([place]) 2009   CD4 count 100, VL 13800 (05/01/2010)  . Hypertension   . Osteomyelitis ([place])    h/o hand  . Osteomyelitis of metatarsal ([place]) 04/28/2017   Past Surgical History:  Procedure Laterality Date  . AMPUTATION Right 05/02/2017   Procedure: AMPUTATION TRANSMETARSAL;  Surgeon: [name], MD;  Location: [facility];  Service: Orthopedics;  Laterality: Right;  . AMPUTATION Right 06/13/2017   Procedure: RIGHT BELOW KNEE AMPUTATION;  Surgeon: [name], MD;  Location: [facility];  Service: Orthopedics;  Laterality: Right;  . BELOW KNEE LEG AMPUTATION Right 06/13/2017  . BELOW KNEE LEG AMPUTATION    . HERNIA REPAIR    . I&D EXTREMITY Left 08/21/2014   Procedure: INCISION AND DRAINAGE LEFT SMALL FINGER;  Surgeon: Leanora Cover, MD;  Location: [facility];  Service: Orthopedics;  Laterality: Left;  . I&D EXTREMITY Right 03/18/2017   Procedure: IRRIGATION AND DEBRIDEMENT EXTREMITY;  Surgeon: [name], MD;  Location: [facility];  Service: Orthopedics;  Laterality: Right;  . MINOR IRRIGATION AND DEBRIDEMENT OF WOUND Right 04/22/2014   Procedure: IRRIGATION AND DEBRIDEMENT OF RIGHT  NECK ABCESS;  Surgeon: Jerrell Belfast, MD;  Location: [facility];  Service: ENT;  Laterality: Right;  . MULTIPLE EXTRACTIONS WITH ALVEOLOPLASTY N/A 01/18/2013   Procedure: MULTIPLE EXTRACION 3, 6, 7, 10, 11, 13, 21, 22, 27, 28, 29, 30 WITH ALVEOLOPLASTY;  Surgeon: Gae Bon, DDS;  Location: [facility];  Service: Oral Surgery;  Laterality: N/A;  . SKIN SPLIT GRAFT Right 03/21/2017   Procedure: IRRIGATION AND DEBRIDEMENT RIGHT FOOT AND APPLY SPLIT THICKNESS SKIN GRAFT AND WOUND VAC;  Surgeon: [name], MD;  Location: [facility];  Service: Orthopedics;  Laterality: Right;  . TEE WITHOUT CARDIOVERSION N/A 05/02/2017   Procedure: TRANSESOPHAGEAL ECHOCARDIOGRAM (TEE);  Surgeon: Acie Fredrickson Wonda Cheng, MD;  Location[facility];  Service: Cardiovascular;  Laterality: N/A;  coincidental to orthopedic case   Social History   Socioeconomic History  . Marital status: Single    Spouse name: Not on file  . Number of children: 3  . Years of education: 34  . Highest education level: Not on file  Occupational History  . Occupation: disabled  Social Needs  . Financial resource strain: Not on file  . Food insecurity    Worry: Not on file    Inability: Not on file  . Transportation needs    Medical: Not on file    Non-medical: Not on file  Tobacco Use  . Smoking status: Never Smoker  . Smokeless tobacco: Never Used  Substance and  Sexual Activity  . Alcohol use: Never    Frequency: Never  . Drug use: Never    Comment: OTC ASA  . Sexual activity: Yes    Partners: Female    Comment: given condoms  Lifestyle  . Physical activity    Days per week: Not on file    Minutes per session: Not on file  . Stress: Not on file  Relationships  . Social Herbalist on phone: Not on file    Gets together: Not on file    Attends religious service: Not on file    Active member of club or organization: Not on file    Attends meetings of clubs or organizations: Not on file    Relationship status: Not on file   Other Topics Concern  . Not on file  Social History Narrative   ** Merged History Encounter **       Worked for the city of [place] for 18 years.   Unemployed.    Applying for disability.   Medicaid patient.   Family History  Problem Relation Age of Onset  . Hypertension Mother   . Arthritis Father   . Hypertension Father   . Hypertension Brother   . Cancer Maternal Grandmother 82       unknown type of cancer  . Depression Paternal Grandmother    - negative except otherwise stated in the family history section Allergies  Allergen Reactions  . Sulfur Itching    Patient stated he's allergic to "sulfur" AND "sulfa"  . Sulfa Antibiotics Itching   Prior to Admission medications   Medication Sig Start Date End Date Taking? Authorizing Provider  acetaminophen (TYLENOL) 325 MG tablet Take 2 tablets (650 mg total) by mouth every 6 (six) hours as needed. Patient taking differently: Take 650 mg by mouth every 6 (six) hours as needed.  08/08/18 08/08/19 Yes Mercy Riding, MD  amLODipine (NORVASC) 10 MG tablet Take 1 tablet (10 mg total) by mouth daily. 07/09/18  Yes [name], FNP  bictegravir-emtricitabine-tenofovir AF (BIKTARVY) 50-200-25 MG TABS tablet Take 1 tablet by mouth daily. 10/15/18  Yes [name], MD  blood glucose meter kit and supplies Dispense based on patient and insurance preference. Use up to four times daily as directed. (FOR ICD-10 E10.9, E11.9). 07/31/18  Yes [name], FNP  buprenorphine (BUTRANS) 20 MCG/HR PTWK patch Place 1 patch onto the skin. 08/14/18  Yes [provider]  cephALEXin (KEFLEX) 250 MG capsule Take 1 capsule (250 mg total) by mouth 4 (four) times daily. 10/25/18  Yes Hayden Rasmussen, MD  doxycycline (ADOXA) 100 MG tablet Take 1 tablet (100 mg total) by mouth 2 (two) times daily. 10/22/18  Yes Aundra Dubin, PA-C  EASY COMFORT PEN NEEDLES 31G X 5 MM MISC INJECT 20 UNITS DAILY AS DIRECTED. 10/19/18  Yes [name], FNP   escitalopram (LEXAPRO) 20 MG tablet Take 20 mg by mouth daily. 07/15/18  Yes [provider]  glipiZIDE (GLUCOTROL) 10 MG tablet Take 1 tablet (10 mg total) by mouth 2 (two) times daily before a meal. 09/14/18  Yes [name], FNP  glucose blood (COOL BLOOD GLUCOSE TEST STRIPS) test strip Use as instructed 01/05/18  Yes [name], FNP  Insulin Glargine (LANTUS SOLOSTAR) 100 UNIT/ML Solostar Pen Inject 30 Units into the skin daily. 09/14/18  Yes [name], FNP  insulin lispro (HUMALOG) 100 UNIT/ML injection Inject 10 units under skin 3  times a day, with meals, for blood glucose levels greater than 125. 09/14/18 09/14/19 Yes [name], FNP  Insulin Syringes, Disposable, U-100 0.3 ML MISC 1 Syringe by Does not apply route 3 (three) times daily. 09/22/18  Yes [name], FNP  Lancets [facility] ULTRASOFT) lancets Use as instructed 01/05/18  Yes [name], FNP  metFORMIN (GLUCOPHAGE) 1000 MG tablet Take 1 tablet (1,000 mg total) by mouth 2 (two) times daily with a meal. Patient taking differently: Take 1,000 mg by mouth daily.  09/14/18  Yes [name], FNP  oxybutynin (DITROPAN) 5 MG tablet TAKE 1 TABLET (5 MG TOTAL) BY MOUTH 2 (TWO) TIMES DAILY. 10/19/18  Yes [name], FNP  Oxycodone HCl 10 MG TABS Take 10 mg by mouth 4 (four) times daily. 07/09/18  Yes [provider]  tadalafil (ADCIRCA/CIALIS) 20 MG tablet Take 0.5-1 tablets (10-20 mg total) by mouth every other day as needed for erectile dysfunction. 11/05/17  Yes [name], FNP   No results found. - pertinent xrays, CT, MRI studies were reviewed and independently interpreted  Positive ROS: All other systems have been reviewed and were otherwise negative with the exception of those mentioned in the HPI and as above.  Physical Exam: General: Alert, no acute distress Psychiatric: Patient is competent for consent with normal mood and affect Lymphatic: No axillary or cervical  lymphadenopathy Cardiovascular: No pedal edema Respiratory: No cyanosis, no use of accessory musculature GI: No organomegaly, abdomen is soft and non-tender    Images:  _0 @  Labs:  Lab Results  Component Value Date   HGBA1C >15.5 (H) 09/07/2018   HGBA1C 15.0 (H) 08/07/2018   HGBA1C 7.8 (H) 06/06/2018   ESRSEDRATE 74 (H) 06/06/2018   ESRSEDRATE 140 (H) 04/28/2017   ESRSEDRATE 127 (H) 03/15/2017   CRP <0.8 06/06/2018   CRP 14.9 (H) 04/28/2017   CRP 23.1 (H) 03/15/2017   LABURIC 6.3 08/07/2009   REPTSTATUS 06/11/2018 FINAL 06/06/2018   GRAMSTAIN  04/29/2017    NO WBC SEEN RARE GRAM POSITIVE COCCI IN PAIRS IN SINGLES    CULT  06/06/2018    NO GROWTH 5 DAYS Performed at [facility], [place] [address], [place], [place] [address]    LABORGA METHICILLIN RESISTANT STAPHYLOCOCCUS AUREUS 04/29/2017    Lab Results  Component Value Date   ALBUMIN 2.8 (L) 10/25/2018   ALBUMIN 2.9 (L) 10/11/2018   ALBUMIN 3.8 09/07/2018   PREALBUMIN 25.5 06/06/2018   PREALBUMIN 7.4 (L) 03/15/2017   LABURIC 6.3 08/07/2009    Neurologic: Patient does not have protective sensation bilateral lower extremities.   MUSCULOSKELETAL:   Skin: Examination patient has a abscess ulcer that is 2 cm in diameter and a centimeter deep.  This was probed with gauze and there was a purulent abscess and this was decompressed.  The tissue the base of the wound is healthy there is no exposed bone or tendon.  Examination of the patient's laboratory studies he has protein caloric malnutrition with an albumin of 2.8 and uncontrolled type 2 diabetes with a hemoglobin A1c greater than 15.5  Assessment: Assessment: Uncontrolled type 2 diabetes with protein caloric malnutrition with a abscess ulcer right transtibial amputation.  Plan: Plan: I discussed with his nurse washing the wound with soap and water twice a day packing the wound open with 4 x 4 gauze.  Continue the IV antibiotics.  I will follow-up  with the patient in the office patient will follow-up with biotech for a  new socket fitting  Thank you for the consult and the opportunity to see Keith [name], [place] [phone] 2:46 PM

## 2018-10-30 NOTE — Evaluation (Signed)
Physical Therapy Evaluation Patient Details Name: Keith [name] MRN: [id number] DOB: Jul 21, 1965 Today's Date: 10/30/2018   History of Present Illness  Pt is a 53 y/o male admitted secondary to R residual limb wound and infection. PMH including but not limited to DM2, HIV, HTN, s/p R BKA 06/13/17.  Clinical Impression  Patient presents with decreased mobility due to not able to wear his prosthesis and with decreased balance and will benefit from skilled PT in the acute setting to allow return home with intermittent support from HHPT/aide and pt requesting to set up dressing changes at home.      Follow Up Recommendations Home health PT    Equipment Recommendations  None recommended by PT(doesn't want a wheelchair)    Recommendations for Other Services       Precautions / Restrictions Precautions Precautions: Fall Precaution Comments: R BKA, L lateral lower leg wound      Mobility  Bed Mobility Overal bed mobility: Independent                Transfers Overall transfer level: Needs assistance   Transfers: Sit to/from Stand Sit to Stand: Supervision         General transfer comment: able to stand on his own, but assist for safety  Ambulation/Gait Ambulation/Gait assistance: Supervision Gait Distance (Feet): 60 Feet Assistive device: Rolling walker (2 wheeled) Gait Pattern/deviations: Step-to pattern     General Gait Details: hopping on L LE no LOB, good safety with RW and endurance  Stairs Stairs: (discussed options for stair negotiation for home entry)          Wheelchair Mobility    Modified Rankin (Stroke Patients Only)       Balance Overall balance assessment: Needs assistance   Sitting balance-Leahy Scale: Good       Standing balance-Leahy Scale: Poor Standing balance comment: UE support needed due to R BKA                             Pertinent Vitals/Pain Pain Assessment: No/denies pain    Home Living Family/patient  expects to be discharged to:: Private residence Living Arrangements: Alone Available Help at Discharge: Family;Available PRN/intermittently Type of Home: Apartment Home Access: Stairs to enter   Entrance Stairs-Number of Steps: 2 Home Layout: One level Home Equipment: Walker - 2 wheels;Tub bench      Prior Function Level of Independence: Independent with assistive device(s)         Comments: used prosthetic, but now too big and has wound on residual limb     Hand Dominance        Extremity/Trunk Assessment   Upper Extremity Assessment Upper Extremity Assessment: Overall WFL for tasks assessed    Lower Extremity Assessment Lower Extremity Assessment: RLE deficits/detail RLE Deficits / Details: R BKA       Communication   Communication: No difficulties  Cognition Arousal/Alertness: Awake/alert Behavior During Therapy: WFL for tasks assessed/performed Overall Cognitive Status: Within Functional Limits for tasks assessed                                        General Comments General comments (skin integrity, edema, etc.): dressing on R residual limb fell off in bed applied clean dry gauze over adherent gauze on wound and Kerlix wrap, RN aware needs Ace wraps for more secure dressing and for  residual limb shaping/ edema control; applied compression sock on L foot pt had on but was coming off.  Educated pt in using technique starting with sock half inside out to don foot portion first; also education and handout given on figure 8 application of ace wraps to residual limb.    Exercises     Assessment/Plan    PT Assessment    PT Problem List         PT Treatment Interventions      PT Goals (Current goals can be found in the Care Plan section)  Acute Rehab PT Goals Patient Stated Goal: to walk independent PT Goal Formulation: With patient Time For Goal Achievement: 11/06/18 Potential to Achieve Goals: Good    Frequency     Barriers to  discharge        Co-evaluation               AM-PAC PT "6 Clicks" Mobility  Outcome Measure Help needed turning from your back to your side while in a flat bed without using bedrails?: None Help needed moving from lying on your back to sitting on the side of a flat bed without using bedrails?: None Help needed moving to and from a bed to a chair (including a wheelchair)?: None Help needed standing up from a chair using your arms (e.g., wheelchair or bedside chair)?: None Help needed to walk in hospital room?: A Little Help needed climbing 3-5 steps with a railing? : A Little 6 Click Score: 22    End of Session Equipment Utilized During Treatment: Gait belt Activity Tolerance: Patient tolerated treatment well Patient left: in bed;with call bell/phone within reach   PT Visit Diagnosis: Other abnormalities of gait and mobility (R26.89)    Time: 1345-1418 PT Time Calculation (min) (ACUTE ONLY): 33 min   Charges:   PT Evaluation $PT Eval Moderate Complexity: 1 Mod PT Treatments $Gait Training: 8-22 mins        Magda Kiel, [place] [phone] 10/30/2018   Reginia Naas 10/30/2018, 2:28 PM

## 2018-10-30 NOTE — Progress Notes (Deleted)
PT Cancellation Note  Patient Details Name: Keith [name] MRN: [id number] DOB: 10-08-1965   Cancelled Treatment:    Reason Eval/Treat Not Completed: Patient at procedure or test/unavailable; undergoing Echo in the room.  Will attempt later as able.   Reginia Naas 10/30/2018, 12:13 PM  Magda Kiel, [place] [phone] 10/30/2018

## 2018-10-30 NOTE — Progress Notes (Signed)
PROGRESS NOTE  Keith [name] ZJI:[id number] DOB: December 17, 1965 DOA: 10/27/2018 PCP: System, Pcp Not In  HPI/Recap of past 24 hours: [name] a 53 y.o.malewith medical history significant ofDM2, HIV, HTN, s/p R BKA.  Patient presents to the ED with ongoing ulcer and foul smelling drainage from his R BKA stump. Seen recently by [name] on 7/16 for this. Started on doxycycline, but symptoms persisted / worsened. Seen again in ED on 7/19 and started on Keflex. Symptoms again persisted and worsened with increased foul smelling drainage. Denies any leg trauma.  No fevers, no vomiting.  10/30/18: Patient was seen and examined at his bedside this morning.  Drainage noted on his right stump.  His pain is well controlled on current pain medication.  Orthopedic surgery consulted to further assess.   Assessment/Plan: Principal Problem:   Amputation stump infection ([place]) Active Problems:   Essential hypertension   HIV disease ([place])   Acquired absence of right leg below knee ([facility])   Type II diabetes mellitus with renal manifestations ([place])  Right lower extremity stump infection and pressure ulcer Failed outpatient antibiotics Positive MRSA screening in 08/07/2018 and on 10/29/2018 Continue IV vancomycin Wound care specialist saw the patient and recommended dressing changes twice a day Orthopedic surgery, [name] consulted and will see in consultation. Will order ESR and CRP.  Intractable nausea, possibly related to uncontrolled hyperglycemia Continue to treat symptomatically with IV Zofran for nausea and IV Phenergan for refractory nausea and vomiting.  Uncontrolled hypertension may be contributed by pain Currently on amlodipine 10 mg daily We will reduce rate of IV fluid down to 75 cc/h If no improvement may consider adding a second agent for his hypertension Continue to monitor vital signs  AKI on CKD 2, improving Baseline creatinine appears to be 1.1 Patient is  currently at his baseline creatinine Presented with creatinine of 1.85 with GFR 47 Creatinine back to baseline 1.1 with GFR greater than 60 Continue to avoid nephrotoxins Continue gentle IV fluid hydration  Type 2 diabetes with hyperglycemia Continue insulin coverage Continue IV fluid hydration  HIV  Continue HAART Continue dapsone prophylactically     DVT prophylaxis: Lovenox Code Status: Full Family Communication: No family in room Disposition Plan:  DC to home when orthopedic surgery signs off. Consults called:  Wound care specialist.,  Orthopedic surgery, [name].   Objective: Vitals:   10/29/18 2029 10/30/18 0605 10/30/18 0711 10/30/18 1238  BP: (!) 141/90 (!) 160/98  (!) 166/95  Pulse: 70 69    Resp: 17 18    Temp: 98.3 F (36.8 C) 98.1 F (36.7 C)    TempSrc: Oral Oral    SpO2: 99% 100%    Weight:   113 kg   Height:        Intake/Output Summary (Last 24 hours) at 10/30/2018 1359 Last data filed at 10/30/2018 0710 Gross per 24 hour  Intake 3710.9 ml  Output 2000 ml  Net 1710.9 ml   Filed Weights   10/27/18 2350 10/29/18 0551 10/30/18 0711  Weight: 113.4 kg 114.8 kg 113 kg    Exam:   General: 53 y.o. year-old male well-developed well-nourished in no acute distress.  Alert and oriented x3.  Cardiovascular: Regular rate rate and rhythm with no rubs or gallops.  No JVD or thyromegaly noted.  Respiratory: Clear to auscultation with no wheezes or rales.  Good inspiratory effort.  Abdomen: Soft nontender nondistended normal bowel sounds present.  Musculoskeletal: Right lower extremity stump with wound  and mild  drainage  Psychiatry: Mood is appropriate for condition and setting.  Data Reviewed: CBC: Recent Labs  Lab 10/25/18 0410 10/27/18 2355  WBC 10.3 6.4  NEUTROABS 7.5  --   HGB 10.8* 9.9*  HCT 34.6* 32.3*  MCV 83.4 83.7  PLT 233 597   Basic Metabolic Panel: Recent Labs  Lab 10/27/18 2355 10/28/18 0900 10/28/18 1148 10/29/18 0801  10/30/18 0409  NA 126* 135 136 135 136  K 3.8 3.4* 3.5 4.1 4.2  CL 92* 101 102 104 105  CO2 '22 24 26 24 25  '$ GLUCOSE 653* 164* 208* 244* 206*  BUN '19 17 17 11 11  '$ CREATININE 1.82* 1.43* 1.47* 1.20 1.15  CALCIUM 8.8* 9.0 8.9 9.0 8.8*   GFR: Estimated Creatinine Clearance: 92 mL/min (by C-G formula based on SCr of 1.15 mg/dL). Liver Function Tests: Recent Labs  Lab 10/25/18 0410  AST 18  ALT 13  ALKPHOS 121  BILITOT 0.6  PROT 8.3*  ALBUMIN 2.8*   No results for input(s): LIPASE, AMYLASE in the last 168 hours. No results for input(s): AMMONIA in the last 168 hours. Coagulation Profile: No results for input(s): INR, PROTIME in the last 168 hours. Cardiac Enzymes: No results for input(s): CKTOTAL, CKMB, CKMBINDEX, TROPONINI in the last 168 hours. BNP (last 3 results) No results for input(s): PROBNP in the last 8760 hours. HbA1C: No results for input(s): HGBA1C in the last 72 hours. CBG: Recent Labs  Lab 10/29/18 0757 10/29/18 1137 10/29/18 1707 10/29/18 2132 10/30/18 0736  GLUCAP 254* 282* 186* 224* 185*   Lipid Profile: No results for input(s): CHOL, HDL, LDLCALC, TRIG, CHOLHDL, LDLDIRECT in the last 72 hours. Thyroid Function Tests: No results for input(s): TSH, T4TOTAL, FREET4, T3FREE, THYROIDAB in the last 72 hours. Anemia Panel: No results for input(s): VITAMINB12, FOLATE, FERRITIN, TIBC, IRON, RETICCTPCT in the last 72 hours. Urine analysis:    Component Value Date/Time   COLORURINE YELLOW 10/25/2018 0605   APPEARANCEUR HAZY (A) 10/25/2018 0605   LABSPEC 1.017 10/25/2018 0605   PHURINE 5.0 10/25/2018 0605   GLUCOSEU >=500 (A) 10/25/2018 0605   HGBUR MODERATE (A) 10/25/2018 0605   BILIRUBINUR NEGATIVE 10/25/2018 0605   BILIRUBINUR negative 09/07/2018 1034   KETONESUR NEGATIVE 10/25/2018 0605   PROTEINUR 100 (A) 10/25/2018 0605   UROBILINOGEN 1.0 09/07/2018 1034   UROBILINOGEN 1.0 01/01/2017 1032   NITRITE NEGATIVE 10/25/2018 0605   LEUKOCYTESUR  NEGATIVE 10/25/2018 0605   Sepsis Labs: '@LABRCNTIP'$ (procalcitonin:4,lacticidven:4)  ) Recent Results (from the past 240 hour(s))  SARS Coronavirus 2 (CEPHEID - Performed in [place] hospital lab), Hosp Order     Status: None   Collection Time: 10/25/18  7:50 AM   Specimen: Nasopharyngeal Swab  Result Value Ref Range Status   SARS Coronavirus 2 NEGATIVE NEGATIVE Final    Comment: (NOTE) If result is NEGATIVE SARS-CoV-2 target nucleic acids are NOT DETECTED. The SARS-CoV-2 RNA is generally detectable in upper and lower  respiratory specimens during the acute phase of infection. The lowest  concentration of SARS-CoV-2 viral copies this assay can detect is 250  copies / mL. A negative result does not preclude SARS-CoV-2 infection  and should not be used as the sole basis for treatment or other  patient management decisions.  A negative result may occur with  improper specimen collection / handling, submission of specimen other  than nasopharyngeal swab, presence of viral mutation(s) within the  areas targeted by this assay, and inadequate number of viral copies  (<250  copies / mL). A negative result must be combined with clinical  observations, patient history, and epidemiological information. If result is POSITIVE SARS-CoV-2 target nucleic acids are DETECTED. The SARS-CoV-2 RNA is generally detectable in upper and lower  respiratory specimens dur ing the acute phase of infection.  Positive  results are indicative of active infection with SARS-CoV-2.  Clinical  correlation with patient history and other diagnostic information is  necessary to determine patient infection status.  Positive results do  not rule out bacterial infection or co-infection with other viruses. If result is PRESUMPTIVE POSTIVE SARS-CoV-2 nucleic acids MAY BE PRESENT.   A presumptive positive result was obtained on the submitted specimen  and confirmed on repeat testing.  While 2019 novel coronavirus    (SARS-CoV-2) nucleic acids may be present in the submitted sample  additional confirmatory testing may be necessary for epidemiological  and / or clinical management purposes  to differentiate between  SARS-CoV-2 and other Sarbecovirus currently known to infect humans.  If clinically indicated additional testing with an alternate test  methodology [id number]) is advised. The SARS-CoV-2 RNA is generally  detectable in upper and lower respiratory sp ecimens during the acute  phase of infection. The expected result is Negative. Fact Sheet for Patients:  StrictlyIdeas.no Fact Sheet for Healthcare Providers: BankingDealers.co.za This test is not yet approved or cleared by the [place] FDA and has been authorized for detection and/or diagnosis of SARS-CoV-2 by FDA under an Emergency Use Authorization (EUA).  This EUA will remain in effect (meaning this test can be used) for the duration of the COVID-19 declaration under Section 564(b)(1) of the Act, 21 U.S.C. section 360bbb-3(b)(1), unless the authorization is terminated or revoked sooner. Performed at [facility], [place] [address] [place], [place] [address]   SARS Coronavirus 2 (CEPHEID - Performed in [place] hospital lab), Hosp Order     Status: None   Collection Time: 10/28/18  5:30 AM   Specimen: Nasopharyngeal Swab  Result Value Ref Range Status   SARS Coronavirus 2 NEGATIVE NEGATIVE Final    Comment: (NOTE) If result is NEGATIVE SARS-CoV-2 target nucleic acids are NOT DETECTED. The SARS-CoV-2 RNA is generally detectable in upper and lower  respiratory specimens during the acute phase of infection. The lowest  concentration of SARS-CoV-2 viral copies this assay can detect is 250  copies / mL. A negative result does not preclude SARS-CoV-2 infection  and should not be used as the sole basis for treatment or other  patient management decisions.  A negative result may occur  with  improper specimen collection / handling, submission of specimen other  than nasopharyngeal swab, presence of viral mutation(s) within the  areas targeted by this assay, and inadequate number of viral copies  (<250 copies / mL). A negative result must be combined with clinical  observations, patient history, and epidemiological information. If result is POSITIVE SARS-CoV-2 target nucleic acids are DETECTED. The SARS-CoV-2 RNA is generally detectable in upper and lower  respiratory specimens dur ing the acute phase of infection.  Positive  results are indicative of active infection with SARS-CoV-2.  Clinical  correlation with patient history and other diagnostic information is  necessary to determine patient infection status.  Positive results do  not rule out bacterial infection or co-infection with other viruses. If result is PRESUMPTIVE POSTIVE SARS-CoV-2 nucleic acids MAY BE PRESENT.   A presumptive positive result was obtained on the submitted specimen  and confirmed on repeat testing.  While 2019 novel  coronavirus  (SARS-CoV-2) nucleic acids may be present in the submitted sample  additional confirmatory testing may be necessary for epidemiological  and / or clinical management purposes  to differentiate between  SARS-CoV-2 and other Sarbecovirus currently known to infect humans.  If clinically indicated additional testing with an alternate test  methodology [id number]) is advised. The SARS-CoV-2 RNA is generally  detectable in upper and lower respiratory sp ecimens during the acute  phase of infection. The expected result is Negative. Fact Sheet for Patients:  StrictlyIdeas.no Fact Sheet for Healthcare Providers: BankingDealers.co.za This test is not yet approved or cleared by the [place] FDA and has been authorized for detection and/or diagnosis of SARS-CoV-2 by FDA under an Emergency Use Authorization (EUA).  This EUA  will remain in effect (meaning this test can be used) for the duration of the COVID-19 declaration under Section 564(b)(1) of the Act, 21 U.S.C. section 360bbb-3(b)(1), unless the authorization is terminated or revoked sooner. Performed at [facility], [place] [address], [place], [place] [address]   MRSA PCR Screening     Status: Abnormal   Collection Time: 10/29/18  6:16 PM   Specimen: Nasopharyngeal  Result Value Ref Range Status   MRSA by PCR POSITIVE (A) NEGATIVE Final    Comment:        The GeneXpert MRSA Assay (FDA approved for NASAL specimens only), is one component of a comprehensive MRSA colonization surveillance program. It is not intended to diagnose MRSA infection nor to guide or monitor treatment for MRSA infections. RESULT CALLED TO, READ BACK BY AND VERIFIED WITH: Marlou Sa RN 10/29/18 2038 JDW Performed at [facility], [address], [place], [place] [address]       Studies: No results found.  Scheduled Meds:  amLODipine  10 mg Oral Daily   bictegravir-emtricitabine-tenofovir AF  1 tablet Oral Daily   buprenorphine  1 patch Transdermal Weekly   Chlorhexidine Gluconate Cloth  6 each Topical Q0600   collagenase   Topical BID   dapsone  100 mg Oral Daily   enoxaparin (LOVENOX) injection  40 mg Subcutaneous Daily   escitalopram  20 mg Oral Daily   insulin aspart  0-15 Units Subcutaneous TID WC   insulin aspart  0-5 Units Subcutaneous QHS   insulin glargine  20 Units Subcutaneous Daily   mupirocin ointment  1 application Nasal BID   oxybutynin  5 mg Oral BID   potassium chloride  40 mEq Oral BID    Continuous Infusions:  sodium chloride 125 mL/hr at 10/29/18 2149   vancomycin       LOS: 2 days     Kayleen Memos, MD [facility] Pager [phone]  If 7PM-7AM, please contact night-coverage [email or web address] Password [facility] 10/30/2018, 1:59 PM

## 2018-10-31 LAB — GLUCOSE, CAPILLARY
Glucose-Capillary: 185 mg/dL — ABNORMAL HIGH (ref 70–99)
Glucose-Capillary: 206 mg/dL — ABNORMAL HIGH (ref 70–99)
Glucose-Capillary: 143 mg/dL — ABNORMAL HIGH (ref 70–99)
Glucose-Capillary: 223 mg/dL — ABNORMAL HIGH (ref 70–99)

## 2018-10-31 MED ORDER — HYDRALAZINE HCL 10 MG PO TABS
10.0000 mg | ORAL_TABLET | Freq: Three times a day (TID) | ORAL | Status: DC
  Administered 2018-10-31 – 2018-11-03 (×10): 10 mg via ORAL
  Filled 2018-10-31 (×10): qty 1

## 2018-10-31 NOTE — Progress Notes (Signed)
PT Cancellation Note  Patient Details Name: Keith [name] MRN: [id number] DOB: 1966/01/05   Cancelled Treatment:    Reason Eval/Treat Not Completed: Pain limiting ability to participate(reports intolerable pain from head ache and refused stair training.)   Rmani Kapusta [name] 10/31/2018, 4:20 PM  Governor Rooks, PTA Acute Rehabilitation Services Pager [phone] Office [phone]

## 2018-10-31 NOTE — Progress Notes (Signed)
PROGRESS NOTE  Keith [name] SWN:[id number] DOB: 09/07/1965 DOA: 10/27/2018 PCP: System, Pcp Not In  HPI/Recap of past 24 hours: Keith [name] a 53 y.o.malewith medical history significant ofDM2, HIV, HTN, s/p R BKA.  Patient presents to the ED with ongoing ulcer and foul smelling drainage from his R BKA stump. Seen recently by [name] on 7/16 for this. Started on doxycycline, but symptoms persisted / worsened. Seen again in ED on 7/19 and started on Keflex. Symptoms again persisted and worsened with increased foul smelling drainage. Denies any leg trauma.  Drainage noted on his right stump.  Seen by ortho, [name]. Recommendations to continue IV antibiotics for abscess/ulcer of R transtibial amputation with regular dressing changes.  10/31/18: Patient was seen and examined at his bedside.  He states he feels better today.  His pain is well controlled on current pain medications.  His nausea is also improving.  No vomiting or abdominal pain.  Bowel regimen in place for constipation.   Assessment/Plan: Principal Problem:   Amputation stump infection ([place]) Active Problems:   Essential hypertension   HIV disease ([place])   Acquired absence of right leg below knee (HCC)   Type II diabetes mellitus with renal manifestations (HCC)  Abscess/ulcer of R transtibial amputation  Failed outpatient antibiotics Elevated inflammatory markers CRP 2.0 ESR 140  Positive MRSA screening in 08/07/2018 and on 10/29/2018 Continue IV vancomycin Continue recommendations per orthopedic surgery: washing the wound with soap and water twice a day packing the wound open with 4 x 4 gauze.  Continue the IV antibiotics. Will follow-up with [name] in the office.  Patient will follow-up with biotech for a new socket fitting.  Elevated inflammatory markers, likely secondary to above Management as stated above  Resolving Intractable nausea, possibly related to uncontrolled hyperglycemia Continue to treat  symptomatically with IV Zofran for nausea and IV Phenergan for refractory nausea and vomiting.  Uncontrolled hypertension may be contributed by pain Currently on amlodipine 10 mg daily Added p.o. hydralazine 10 mg 3 times daily We will discontinue IV fluid including Continue to monitor VS  AKI on CKD 2, improving Baseline creatinine appears to be 1.1 Back to his baseline Presented with creatinine of 1.85 with GFR 47 Continue to avoid nephrotoxins  Type 2 diabetes with hyperglycemia Continue insulin coverage Continue IV fluid hydration  HIV  Continue HAART Continue dapsone prophylactically     DVT prophylaxis: Lovenox sq daily Code Status: Full Family Communication: No family in room Disposition Plan:  DC to home likely in 1 to 2 days once inflammatory markers are trending down. Consults called:  Wound care specialist.,  Orthopedic surgery, [name].   Objective: Vitals:   10/30/18 2216 10/31/18 0547 10/31/18 0602 10/31/18 1056  BP: (!) 158/98 (!) 151/96  113/82  Pulse:  66    Resp:  20    Temp:  98.1 F (36.7 C)    TempSrc:  Oral    SpO2:  100%    Weight:   112.4 kg   Height:        Intake/Output Summary (Last 24 hours) at 10/31/2018 1227 Last data filed at 10/31/2018 0636 Gross per 24 hour  Intake -  Output 1200 ml  Net -1200 ml   Filed Weights   10/29/18 0551 10/30/18 0711 10/31/18 0602  Weight: 114.8 kg 113 kg 112.4 kg    Exam:  . General: 53 y.o. year-old male WD WN NAD A&O x 3 . Cardiovascular: RRR no rubs or  gallops. No JVD or thyromegaly . Respiratory: CTA good inspiratory efforts . Abdomen: soft NT ND NBS x4 . Musculoskeletal: R stump in surgical dressing. Marland Kitchen Psychiatry: Mood is appropriate  Data Reviewed: CBC: Recent Labs  Lab 10/25/18 0410 10/27/18 2355  WBC 10.3 6.4  NEUTROABS 7.5  --   HGB 10.8* 9.9*  HCT 34.6* 32.3*  MCV 83.4 83.7  PLT 233 233   Basic Metabolic Panel: Recent Labs  Lab 10/27/18 2355 10/28/18 0900 10/28/18  1148 10/29/18 0801 10/30/18 0409  NA 126* 135 136 135 136  K 3.8 3.4* 3.5 4.1 4.2  CL 92* 101 102 104 105  CO2 _0 GLUCOSE 653* 164* 208* 244* 206*  BUN _1 CREATININE 1.82* 1.43* 1.47* 1.20 1.15  CALCIUM 8.8* 9.0 8.9 9.0 8.8*   GFR: Estimated Creatinine Clearance: 91.8 mL/min (by C-G formula based on SCr of 1.15 mg/dL). Liver Function Tests: Recent Labs  Lab 10/25/18 0410  AST 18  ALT 13  ALKPHOS 121  BILITOT 0.6  PROT 8.3*  ALBUMIN 2.8*   No results for input(s): LIPASE, AMYLASE in the last 168 hours. No results for input(s): AMMONIA in the last 168 hours. Coagulation Profile: No results for input(s): INR, PROTIME in the last 168 hours. Cardiac Enzymes: No results for input(s): CKTOTAL, CKMB, CKMBINDEX, TROPONINI in the last 168 hours. BNP (last 3 results) No results for input(s): PROBNP in the last 8760 hours. HbA1C: No results for input(s): HGBA1C in the last 72 hours. CBG: Recent Labs  Lab 10/30/18 1403 10/30/18 1619 10/30/18 2146 10/31/18 0733 10/31/18 1122  GLUCAP 132* 133* 180* 185* 206*   Lipid Profile: No results for input(s): CHOL, HDL, LDLCALC, TRIG, CHOLHDL, LDLDIRECT in the last 72 hours. Thyroid Function Tests: No results for input(s): TSH, T4TOTAL, FREET4, T3FREE, THYROIDAB in the last 72 hours. Anemia Panel: No results for input(s): VITAMINB12, FOLATE, FERRITIN, TIBC, IRON, RETICCTPCT in the last 72 hours. Urine analysis:    Component Value Date/Time   COLORURINE YELLOW 10/25/2018 0605   APPEARANCEUR HAZY (A) 10/25/2018 0605   LABSPEC 1.017 10/25/2018 0605   PHURINE 5.0 10/25/2018 0605   GLUCOSEU >=500 (A) 10/25/2018 0605   HGBUR MODERATE (A) 10/25/2018 0605   BILIRUBINUR NEGATIVE 10/25/2018 0605   BILIRUBINUR negative 09/07/2018 1034   KETONESUR NEGATIVE 10/25/2018 0605   PROTEINUR 100 (A) 10/25/2018 0605   UROBILINOGEN 1.0 09/07/2018 1034   UROBILINOGEN 1.0 01/01/2017 1032   NITRITE NEGATIVE 10/25/2018 0605    LEUKOCYTESUR NEGATIVE 10/25/2018 0605   Sepsis Labs: _2 (procalcitonin:4,lacticidven:4)  ) Recent Results (from the past 240 hour(s))  SARS Coronavirus 2 (CEPHEID - Performed in [place] hospital lab), Hosp Order     Status: None   Collection Time: 10/25/18  7:50 AM   Specimen: Nasopharyngeal Swab  Result Value Ref Range Status   SARS Coronavirus 2 NEGATIVE NEGATIVE Final    Comment: (NOTE) If result is NEGATIVE SARS-CoV-2 target nucleic acids are NOT DETECTED. The SARS-CoV-2 RNA is generally detectable in upper and lower  respiratory specimens during the acute phase of infection. The lowest  concentration of SARS-CoV-2 viral copies this assay can detect is 250  copies / mL. A negative result does not preclude SARS-CoV-2 infection  and should not be used as the sole basis for treatment or other  patient management decisions.  A negative result may occur with  improper specimen collection / handling, submission of specimen other  than nasopharyngeal swab, presence of viral  mutation(s) within the  areas targeted by this assay, and inadequate number of viral copies  (<250 copies / mL). A negative result must be combined with clinical  observations, patient history, and epidemiological information. If result is POSITIVE SARS-CoV-2 target nucleic acids are DETECTED. The SARS-CoV-2 RNA is generally detectable in upper and lower  respiratory specimens dur ing the acute phase of infection.  Positive  results are indicative of active infection with SARS-CoV-2.  Clinical  correlation with patient history and other diagnostic information is  necessary to determine patient infection status.  Positive results do  not rule out bacterial infection or co-infection with other viruses. If result is PRESUMPTIVE POSTIVE SARS-CoV-2 nucleic acids MAY BE PRESENT.   A presumptive positive result was obtained on the submitted specimen  and confirmed on repeat testing.  While 2019 novel  coronavirus  (SARS-CoV-2) nucleic acids may be present in the submitted sample  additional confirmatory testing may be necessary for epidemiological  and / or clinical management purposes  to differentiate between  SARS-CoV-2 and other Sarbecovirus currently known to infect humans.  If clinically indicated additional testing with an alternate test  methodology [phone]) is advised. The SARS-CoV-2 RNA is generally  detectable in upper and lower respiratory sp ecimens during the acute  phase of infection. The expected result is Negative. Fact Sheet for Patients:  StrictlyIdeas.no Fact Sheet for Healthcare Providers: BankingDealers.co.za This test is not yet approved or cleared by the [place] FDA and has been authorized for detection and/or diagnosis of SARS-CoV-2 by FDA under an Emergency Use Authorization (EUA).  This EUA will remain in effect (meaning this test can be used) for the duration of the COVID-19 declaration under Section 564(b)(1) of the Act, 21 U.S.C. section 360bbb-3(b)(1), unless the authorization is terminated or revoked sooner. Performed at [facility], [place] [address] [place], [place] [address]   SARS Coronavirus 2 (CEPHEID - Performed in [place] hospital lab), Hosp Order     Status: None   Collection Time: 10/28/18  5:30 AM   Specimen: Nasopharyngeal Swab  Result Value Ref Range Status   SARS Coronavirus 2 NEGATIVE NEGATIVE Final    Comment: (NOTE) If result is NEGATIVE SARS-CoV-2 target nucleic acids are NOT DETECTED. The SARS-CoV-2 RNA is generally detectable in upper and lower  respiratory specimens during the acute phase of infection. The lowest  concentration of SARS-CoV-2 viral copies this assay can detect is 250  copies / mL. A negative result does not preclude SARS-CoV-2 infection  and should not be used as the sole basis for treatment or other  patient management decisions.  A negative  result may occur with  improper specimen collection / handling, submission of specimen other  than nasopharyngeal swab, presence of viral mutation(s) within the  areas targeted by this assay, and inadequate number of viral copies  (<250 copies / mL). A negative result must be combined with clinical  observations, patient history, and epidemiological information. If result is POSITIVE SARS-CoV-2 target nucleic acids are DETECTED. The SARS-CoV-2 RNA is generally detectable in upper and lower  respiratory specimens dur ing the acute phase of infection.  Positive  results are indicative of active infection with SARS-CoV-2.  Clinical  correlation with patient history and other diagnostic information is  necessary to determine patient infection status.  Positive results do  not rule out bacterial infection or co-infection with other viruses. If result is PRESUMPTIVE POSTIVE SARS-CoV-2 nucleic acids MAY BE PRESENT.   A presumptive positive result  was obtained on the submitted specimen  and confirmed on repeat testing.  While 2019 novel coronavirus  (SARS-CoV-2) nucleic acids may be present in the submitted sample  additional confirmatory testing may be necessary for epidemiological  and / or clinical management purposes  to differentiate between  SARS-CoV-2 and other Sarbecovirus currently known to infect humans.  If clinically indicated additional testing with an alternate test  methodology [phone]) is advised. The SARS-CoV-2 RNA is generally  detectable in upper and lower respiratory sp ecimens during the acute  phase of infection. The expected result is Negative. Fact Sheet for Patients:  StrictlyIdeas.no Fact Sheet for Healthcare Providers: BankingDealers.co.za This test is not yet approved or cleared by the [place] FDA and has been authorized for detection and/or diagnosis of SARS-CoV-2 by FDA under an Emergency Use Authorization  (EUA).  This EUA will remain in effect (meaning this test can be used) for the duration of the COVID-19 declaration under Section 564(b)(1) of the Act, 21 U.S.C. section 360bbb-3(b)(1), unless the authorization is terminated or revoked sooner. Performed at [facility], [place] [address], [place], [place] [address]   MRSA PCR Screening     Status: Abnormal   Collection Time: 10/29/18  6:16 PM   Specimen: Nasopharyngeal  Result Value Ref Range Status   MRSA by PCR POSITIVE (A) NEGATIVE Final    Comment:        The GeneXpert MRSA Assay (FDA approved for NASAL specimens only), is one component of a comprehensive MRSA colonization surveillance program. It is not intended to diagnose MRSA infection nor to guide or monitor treatment for MRSA infections. RESULT CALLED TO, READ BACK BY AND VERIFIED WITH: Marlou Sa RN 10/29/18 2038 JDW Performed at [facility], [address] [place], [place] [address]       Studies: No results found.  Scheduled Meds: . amLODipine  10 mg Oral Daily  . bictegravir-emtricitabine-tenofovir AF  1 tablet Oral Daily  . buprenorphine  1 patch Transdermal Weekly  . Chlorhexidine Gluconate Cloth  6 each Topical Q0600  . collagenase   Topical BID  . dapsone  100 mg Oral Daily  . enoxaparin (LOVENOX) injection  40 mg Subcutaneous Daily  . escitalopram  20 mg Oral Daily  . hydrALAZINE  10 mg Oral Q8H  . insulin aspart  0-15 Units Subcutaneous TID WC  . insulin aspart  0-5 Units Subcutaneous QHS  . insulin glargine  20 Units Subcutaneous Daily  . mupirocin ointment  1 application Nasal BID  . oxybutynin  5 mg Oral BID  . potassium chloride  40 mEq Oral BID    Continuous Infusions: . sodium chloride 75 mL/hr at 10/30/18 1412  . vancomycin 1,000 mg (10/31/18 0554)     LOS: 3 days     Kayleen Memos, MD [facility] Pager [phone]  If 7PM-7AM, please contact night-coverage [email or web address] Password [facility] 10/31/2018, 12:27 PM

## 2018-11-01 LAB — BASIC METABOLIC PANEL
Anion gap: 7 (ref 5–15)
BUN: 13 mg/dL (ref 6–20)
CO2: 24 mmol/L (ref 22–32)
Calcium: 8.8 mg/dL — ABNORMAL LOW (ref 8.9–10.3)
Chloride: 103 mmol/L (ref 98–111)
Creatinine, Ser: 1.29 mg/dL — ABNORMAL HIGH (ref 0.61–1.24)
GFR calc Af Amer: >60 mL/min (ref >60)
GFR calc non Af Amer: >60 mL/min (ref >60)
Glucose, Bld: 187 mg/dL — ABNORMAL HIGH (ref 70–99)
Potassium: 4 mmol/L (ref 3.5–5.1)
Sodium: 134 mmol/L — ABNORMAL LOW (ref 135–145)

## 2018-11-01 LAB — GLUCOSE, CAPILLARY
Glucose-Capillary: 212 mg/dL — ABNORMAL HIGH (ref 70–99)
Glucose-Capillary: 123 mg/dL — ABNORMAL HIGH (ref 70–99)
Glucose-Capillary: 179 mg/dL — ABNORMAL HIGH (ref 70–99)

## 2018-11-01 LAB — CBC WITH DIFFERENTIAL/PLATELET
Abs Immature Granulocytes: 0.06 10*3/uL (ref 0.00–0.07)
Basophils Absolute: 0 10*3/uL (ref 0.0–0.1)
Basophils Relative: 0 %
Eosinophils Absolute: 0.1 10*3/uL (ref 0.0–0.5)
Eosinophils Relative: 2 %
HCT: 33.4 % — ABNORMAL LOW (ref 39.0–52.0)
Hemoglobin: 10.5 g/dL — ABNORMAL LOW (ref 13.0–17.0)
Immature Granulocytes: 1 %
Lymphocytes Relative: 34 %
Lymphs Abs: 2.1 10*3/uL (ref 0.7–4.0)
MCH: 25.6 pg — ABNORMAL LOW (ref 26.0–34.0)
MCHC: 31.4 g/dL (ref 30.0–36.0)
MCV: 81.5 fL (ref 80.0–100.0)
Monocytes Absolute: 0.4 10*3/uL (ref 0.1–1.0)
Monocytes Relative: 7 %
Neutro Abs: 3.3 10*3/uL (ref 1.7–7.7)
Neutrophils Relative %: 56 %
Platelets: 384 10*3/uL (ref 150–400)
RBC: 4.1 MIL/uL — ABNORMAL LOW (ref 4.22–5.81)
RDW: 12.6 % (ref 11.5–15.5)
WBC: 6 10*3/uL (ref 4.0–10.5)
nRBC: 0 % (ref 0.0–0.2)

## 2018-11-01 NOTE — Progress Notes (Signed)
PROGRESS NOTE  Keith [name] WUX:[id number] DOB: [birth year] DOA: 10/27/2018 PCP: System, Pcp Not In  HPI/Recap of past 24 hours: Keith [name] a 53 y.o.malewith medical history significant ofDM2, HIV, HTN, s/p R BKA.  Patient presents to the ED with ongoing ulcer and foul smelling drainage from his R BKA stump. Seen recently by [name] on 7/16 for this. Started on doxycycline, but symptoms persisted / worsened. Seen again in ED on 7/19 and started on Keflex. Symptoms again persisted and worsened with increased foul smelling drainage. Denies any leg trauma.  Drainage noted on his right stump.  Seen by ortho, [name]. Recommendations to continue IV antibiotics for abscess/ulcer of R transtibial amputation with regular dressing changes.  11/01/18: Patient was seen and examined at his bedside.  Still requiring pain medication for his R transtibial abscess, infection. Will continue IV antibiotics today and repeat inflammatory markers in the AM.    Assessment/Plan: Principal Problem:   Amputation stump infection ([place]) Active Problems:   Essential hypertension   HIV disease ([place])   Acquired absence of right leg below knee (HCC)   Type II diabetes mellitus with renal manifestations (H[facility])  Abscess/ulcer of R transtibial amputation  Failed outpatient antibiotics Elevated inflammatory markers CRP 2.0 ESR 140  Positive MRSA screening in 08/07/2018 and on 10/29/2018 Continue IV vancomycin Repeat inflammatory markers tomorrow 11/02/18 Continue recommendations per orthopedic surgery: washing the wound with soap and water twice a day packing the wound open with 4 x 4 gauze.  Continue the IV antibiotics. Will follow-up with [name] in the office.  Patient will follow-up with biotech for a new socket fitting.  Elevated inflammatory markers, likely secondary to above Management as stated above Repeat levels am  Uncontrolled HTN Started po hydralazine 10 mg TID Continue Norvasc 10  mg daily  Resolving Intractable nausea, possibly related to uncontrolled hyperglycemia Continue to treat symptomatically with IV Zofran for nausea and IV Phenergan for refractory nausea and vomiting.  AKI on CKD 2, improving Baseline creatinine appears to be 1.1 Cr today 1.2 C/w gentle IV fluid hydration Presented with creatinine of 1.85 with GFR 47 Continue to avoid nephrotoxins  Type 2 diabetes with hyperglycemia Continue insulin coverage Continue IV fluid hydration  HIV  Continue HAART Continue dapsone prophylactically  Chronic normocytic anemia Hemoglobin appears to be close to baseline 10.5 with MCV 81 No sign of overt bleeding      DVT prophylaxis: Lovenox sq daily Code Status: Full Family Communication: No family in room Disposition Plan:  DC home possibly in 1-2 days pending ESR and CRP levels.  Consults called:  Wound care specialist.,  Orthopedic surgery, [name].   Objective: Vitals:   11/01/18 0010 11/01/18 0548 11/01/18 0549 11/01/18 0732  BP: (!) 155/95 (!) 158/101 (!) 158/101 (!) 152/95  Pulse: 65  61 69  Resp: 17     Temp: 98.1 F (36.7 C)  98 F (36.7 C) 97.8 F (36.6 C)  TempSrc: Oral  Oral Oral  SpO2: 100%  100% 100%  Weight:   112.9 kg   Height:        Intake/Output Summary (Last 24 hours) at 11/01/2018 1128 Last data filed at 11/01/2018 0551 Gross per 24 hour  Intake 2197.92 ml  Output 1300 ml  Net 897.92 ml   Filed Weights   10/30/18 0711 10/31/18 0602 11/01/18 0549  Weight: 113 kg 112.4 kg 112.9 kg    Exam:  . General: 53 y.o. year-old male Well developped well  nourished in no acute distress. Alert and oriented x3.   . Cardiovascular: Regular rate and rhythm.  No rubs or gallops.  No JVD or thyromegaly noted. Marland Kitchen Respiratory: Clear to auscultation with no wheezes or rales.  Poor inspiratory effort. . Abdomen: Soft nontender nondistended with normal bowel sounds . Musculoskeletal: Lower extremity right stump in surgical  dressing.  Marland Kitchen Psychiatry: Mood is appropriate for condition and setting.  Data Reviewed: CBC: Recent Labs  Lab 10/27/18 2355 11/01/18 0644  WBC 6.4 6.0  NEUTROABS  --  3.3  HGB 9.9* 10.5*  HCT 32.3* 33.4*  MCV 83.7 81.5  PLT 285 056   Basic Metabolic Panel: Recent Labs  Lab 10/28/18 0900 10/28/18 1148 10/29/18 0801 10/30/18 0409 11/01/18 0644  NA 135 136 135 136 134*  K 3.4* 3.5 4.1 4.2 4.0  CL 101 102 104 105 103  CO2 _0 GLUCOSE 164* 208* 244* 206* 187*  BUN _1 CREATININE 1.43* 1.47* 1.20 1.15 1.29*  CALCIUM 9.0 8.9 9.0 8.8* 8.8*   GFR: Estimated Creatinine Clearance: 82.1 mL/min (A) (by C-G formula based on SCr of 1.29 mg/dL (H)). Liver Function Tests: No results for input(s): AST, ALT, ALKPHOS, BILITOT, PROT, ALBUMIN in the last 168 hours. No results for input(s): LIPASE, AMYLASE in the last 168 hours. No results for input(s): AMMONIA in the last 168 hours. Coagulation Profile: No results for input(s): INR, PROTIME in the last 168 hours. Cardiac Enzymes: No results for input(s): CKTOTAL, CKMB, CKMBINDEX, TROPONINI in the last 168 hours. BNP (last 3 results) No results for input(s): PROBNP in the last 8760 hours. HbA1C: No results for input(s): HGBA1C in the last 72 hours. CBG: Recent Labs  Lab 10/30/18 2146 10/31/18 0733 10/31/18 1122 10/31/18 1632 10/31/18 2122  GLUCAP 180* 185* 206* 143* 223*   Lipid Profile: No results for input(s): CHOL, HDL, LDLCALC, TRIG, CHOLHDL, LDLDIRECT in the last 72 hours. Thyroid Function Tests: No results for input(s): TSH, T4TOTAL, FREET4, T3FREE, THYROIDAB in the last 72 hours. Anemia Panel: No results for input(s): VITAMINB12, FOLATE, FERRITIN, TIBC, IRON, RETICCTPCT in the last 72 hours. Urine analysis:    Component Value Date/Time   COLORURINE YELLOW 10/25/2018 0605   APPEARANCEUR HAZY (A) 10/25/2018 0605   LABSPEC 1.017 10/25/2018 0605   PHURINE 5.0 10/25/2018 0605   GLUCOSEU >=500  (A) 10/25/2018 0605   HGBUR MODERATE (A) 10/25/2018 0605   BILIRUBINUR NEGATIVE 10/25/2018 0605   BILIRUBINUR negative 09/07/2018 1034   KETONESUR NEGATIVE 10/25/2018 0605   PROTEINUR 100 (A) 10/25/2018 0605   UROBILINOGEN 1.0 09/07/2018 1034   UROBILINOGEN 1.0 01/01/2017 1032   NITRITE NEGATIVE 10/25/2018 0605   LEUKOCYTESUR NEGATIVE 10/25/2018 0605   Sepsis Labs: _2 (procalcitonin:4,lacticidven:4)  ) Recent Results (from the past 240 hour(s))  SARS Coronavirus 2 (CEPHEID - Performed in [place] hospital lab), Hosp Order     Status: None   Collection Time: 10/25/18  7:50 AM   Specimen: Nasopharyngeal Swab  Result Value Ref Range Status   SARS Coronavirus 2 NEGATIVE NEGATIVE Final    Comment: (NOTE) If result is NEGATIVE SARS-CoV-2 target nucleic acids are NOT DETECTED. The SARS-CoV-2 RNA is generally detectable in upper and lower  respiratory specimens during the acute phase of infection. The lowest  concentration of SARS-CoV-2 viral copies this assay can detect is 250  copies / mL. A negative result does not preclude SARS-CoV-2 infection  and should not be used as the sole basis for  treatment or other  patient management decisions.  A negative result may occur with  improper specimen collection / handling, submission of specimen other  than nasopharyngeal swab, presence of viral mutation(s) within the  areas targeted by this assay, and inadequate number of viral copies  (<250 copies / mL). A negative result must be combined with clinical  observations, patient history, and epidemiological information. If result is POSITIVE SARS-CoV-2 target nucleic acids are DETECTED. The SARS-CoV-2 RNA is generally detectable in upper and lower  respiratory specimens dur ing the acute phase of infection.  Positive  results are indicative of active infection with SARS-CoV-2.  Clinical  correlation with patient history and other diagnostic information is  necessary to determine  patient infection status.  Positive results do  not rule out bacterial infection or co-infection with other viruses. If result is PRESUMPTIVE POSTIVE SARS-CoV-2 nucleic acids MAY BE PRESENT.   A presumptive positive result was obtained on the submitted specimen  and confirmed on repeat testing.  While 2019 novel coronavirus  (SARS-CoV-2) nucleic acids may be present in the submitted sample  additional confirmatory testing may be necessary for epidemiological  and / or clinical management purposes  to differentiate between  SARS-CoV-2 and other Sarbecovirus currently known to infect humans.  If clinically indicated additional testing with an alternate test  methodology [phone]) is advised. The SARS-CoV-2 RNA is generally  detectable in upper and lower respiratory sp ecimens during the acute  phase of infection. The expected result is Negative. Fact Sheet for Patients:  StrictlyIdeas.no Fact Sheet for Healthcare Providers: BankingDealers.co.za This test is not yet approved or cleared by the [place] FDA and has been authorized for detection and/or diagnosis of SARS-CoV-2 by FDA under an Emergency Use Authorization (EUA).  This EUA will remain in effect (meaning this test can be used) for the duration of the COVID-19 declaration under Section 564(b)(1) of the Act, 21 U.S.C. section 360bbb-3(b)(1), unless the authorization is terminated or revoked sooner. Performed at [facility], [place] [address], [place], [place] [address]   SARS Coronavirus 2 (CEPHEID - Performed in [place] hospital lab), Hosp Order     Status: None   Collection Time: 10/28/18  5:30 AM   Specimen: Nasopharyngeal Swab  Result Value Ref Range Status   SARS Coronavirus 2 NEGATIVE NEGATIVE Final    Comment: (NOTE) If result is NEGATIVE SARS-CoV-2 target nucleic acids are NOT DETECTED. The SARS-CoV-2 RNA is generally detectable in upper and lower   respiratory specimens during the acute phase of infection. The lowest  concentration of SARS-CoV-2 viral copies this assay can detect is 250  copies / mL. A negative result does not preclude SARS-CoV-2 infection  and should not be used as the sole basis for treatment or other  patient management decisions.  A negative result may occur with  improper specimen collection / handling, submission of specimen other  than nasopharyngeal swab, presence of viral mutation(s) within the  areas targeted by this assay, and inadequate number of viral copies  (<250 copies / mL). A negative result must be combined with clinical  observations, patient history, and epidemiological information. If result is POSITIVE SARS-CoV-2 target nucleic acids are DETECTED. The SARS-CoV-2 RNA is generally detectable in upper and lower  respiratory specimens dur ing the acute phase of infection.  Positive  results are indicative of active infection with SARS-CoV-2.  Clinical  correlation with patient history and other diagnostic information is  necessary to determine patient infection status.  Positive results do  not rule out bacterial infection or co-infection with other viruses. If result is PRESUMPTIVE POSTIVE SARS-CoV-2 nucleic acids MAY BE PRESENT.   A presumptive positive result was obtained on the submitted specimen  and confirmed on repeat testing.  While 2019 novel coronavirus  (SARS-CoV-2) nucleic acids may be present in the submitted sample  additional confirmatory testing may be necessary for epidemiological  and / or clinical management purposes  to differentiate between  SARS-CoV-2 and other Sarbecovirus currently known to infect humans.  If clinically indicated additional testing with an alternate test  methodology [phone]) is advised. The SARS-CoV-2 RNA is generally  detectable in upper and lower respiratory sp ecimens during the acute  phase of infection. The expected result is Negative. Fact  Sheet for Patients:  StrictlyIdeas.no Fact Sheet for Healthcare Providers: BankingDealers.co.za This test is not yet approved or cleared by the [place] FDA and has been authorized for detection and/or diagnosis of SARS-CoV-2 by FDA under an Emergency Use Authorization (EUA).  This EUA will remain in effect (meaning this test can be used) for the duration of the COVID-19 declaration under Section 564(b)(1) of the Act, 21 U.S.C. section 360bbb-3(b)(1), unless the authorization is terminated or revoked sooner. Performed at [facility], [place] [address], [place], [place] [address]   MRSA PCR Screening     Status: Abnormal   Collection Time: 10/29/18  6:16 PM   Specimen: Nasopharyngeal  Result Value Ref Range Status   MRSA by PCR POSITIVE (A) NEGATIVE Final    Comment:        The GeneXpert MRSA Assay (FDA approved for NASAL specimens only), is one component of a comprehensive MRSA colonization surveillance program. It is not intended to diagnose MRSA infection nor to guide or monitor treatment for MRSA infections. RESULT CALLED TO, READ BACK BY AND VERIFIED WITH: Marlou Sa RN 10/29/18 2038 JDW Performed at [facility], [address] [place], [place] [address]       Studies: No results found.  Scheduled Meds: . amLODipine  10 mg Oral Daily  . bictegravir-emtricitabine-tenofovir AF  1 tablet Oral Daily  . [START ON 11/03/2018] buprenorphine  1 patch Transdermal Weekly  . Chlorhexidine Gluconate Cloth  6 each Topical Q0600  . collagenase   Topical BID  . dapsone  100 mg Oral Daily  . enoxaparin (LOVENOX) injection  40 mg Subcutaneous Daily  . escitalopram  20 mg Oral Daily  . hydrALAZINE  10 mg Oral Q8H  . insulin aspart  0-15 Units Subcutaneous TID WC  . insulin aspart  0-5 Units Subcutaneous QHS  . insulin glargine  20 Units Subcutaneous Daily  . mupirocin ointment  1 application Nasal BID  . oxybutynin  5 mg  Oral BID  . potassium chloride  40 mEq Oral BID    Continuous Infusions: . sodium chloride 75 mL/hr at 11/01/18 0500  . vancomycin 1,000 mg (11/01/18 0551)     LOS: 4 days     Kayleen Memos, MD [facility] Pager [phone]  If 7PM-7AM, please contact night-coverage [email or web address] Password [facility] 11/01/2018, 11:28 AM

## 2018-11-01 NOTE — Progress Notes (Signed)
CSW acknowledge consult for homelessness. Patient informed CSW that he was staying with his mom and return in order to receive his home health. CSW informed patient that he would receive follow up in regards to his home heath services. CSW closed consult.

## 2018-11-01 NOTE — Progress Notes (Signed)
PT Cancellation Note  Patient Details Name: Keith [name] MRN: [id number] DOB: [birth year]   Cancelled Treatment:    Reason Eval/Treat Not Completed: Patient declined, no reason specified - pt cited headache indicating right temple.  Stated he does "just fine" using RW to ambulate without his prosthesis and denies need for practice.  Will reattempt next date, recommend HHPT if pt should d/c before.     Kearney Hard [facility] 11/01/2018, 1:38 PM

## 2018-11-02 LAB — GLUCOSE, CAPILLARY
Glucose-Capillary: 232 mg/dL — ABNORMAL HIGH (ref 70–99)
Glucose-Capillary: 225 mg/dL — ABNORMAL HIGH (ref 70–99)
Glucose-Capillary: 205 mg/dL — ABNORMAL HIGH (ref 70–99)
Glucose-Capillary: 96 mg/dL (ref 70–99)
Glucose-Capillary: 143 mg/dL — ABNORMAL HIGH (ref 70–99)

## 2018-11-02 LAB — C-REACTIVE PROTEIN: CRP: <0.8 mg/dL (ref <1.0)

## 2018-11-02 LAB — SEDIMENTATION RATE: Sed Rate: 130 mm/hr — ABNORMAL HIGH (ref 0–16)

## 2018-11-02 NOTE — Progress Notes (Addendum)
Pharmacy Antibiotic Note  Keith [name] is a 53 y.o. male admitted on 10/27/2018 with R-BKA stump infection.  Pharmacy has been consulted for Vancomycin dosing.   The patient's SCr was up to 1.29 yesterday, no labs today. Will reduce the Vancomycin dose slightly. Per MD - likely discharge in 1-2 days so will hold off on levels. Will recheck SCr in the AM  Plan: - Reduce Vancomycin to 750 mg IV every 12 hours - Est AUC 451 using SCr 1.29 - Will f/u with SCr in the AM to evaluate trends - Will f/u with plans for LOT with MD   Height: 5\' 9"  (175.3 cm) Weight: 246 lb 12.8 oz (111.9 kg) IBW/kg (Calculated) : 70.7  Temp (24hrs), Avg:97.8 F (36.6 C), Min:97.6 F (36.4 C), Max:98 F (36.7 C)  Recent Labs  Lab 10/27/18 2355 10/28/18 0900 10/28/18 1148 10/29/18 0801 10/30/18 0409 11/01/18 0644  WBC 6.4  --   --   --   --  6.0  CREATININE 1.82* 1.43* 1.47* 1.20 1.15 1.29*    Estimated Creatinine Clearance: 81.7 mL/min (A) (by C-G formula based on SCr of 1.29 mg/dL (H)).    Allergies  Allergen Reactions  . Sulfur Itching    Patient stated he's allergic to "sulfur" AND "sulfa"  . Sulfa Antibiotics Itching    Doxy/Keflex PTA Vanc 7/22 >>  7/22 COVID >> neg 7/23 MRSA PCR >> positive 7/24 Abscess cx (superficial) >>  Thank you for allowing pharmacy to be a part of this patient's care.  Alycia Rossetti, PharmD, BCPS Clinical Pharmacist Clinical phone for 11/02/2018: [phone] 11/02/2018 1:24 PM   **Pharmacist phone directory can now be found on [email or web address] ([email or web address]).  Listed under [place].

## 2018-11-02 NOTE — Care Management Important Message (Signed)
Important Message  Patient Details  Name: Keith [name] MRN: [id number] Date of Birth: 04-15-1965   Medicare Important Message Given:  Yes     Shelda Altes 11/02/2018, 1:33 PM

## 2018-11-02 NOTE — Progress Notes (Signed)
Physical Therapy Treatment Patient Details Name: Keith [name] MRN: [id number] DOB: March 14, 1966 Today's Date: 11/02/2018    History of Present Illness Pt is a 53 y/o male admitted secondary to R residual limb wound and infection. PMH including but not limited to DM2, HIV, HTN, s/p R BKA 06/13/17.    PT Comments    Patient agrees to walk, but wants to stay in room so he doesn't have to wear mask. Patient reports he is feeling better, wants to go home tomorrow. We discussed ways for him to enter house safely. Patient ambulated with RW and supervision x 60 feet. Steady, no LOB. Patient will benefit from PT follow up for strengthening, endurance training.    Follow Up Recommendations  Home health PT     Equipment Recommendations  None recommended by PT    Recommendations for Other Services       Precautions / Restrictions Precautions Precautions: Fall Precaution Comments: R BKA, L lateral lower leg wound Restrictions Weight Bearing Restrictions: No Other Position/Activity Restrictions: patient unable to wear prosthesis due to it being too loose.    Mobility  Bed Mobility Overal bed mobility: Independent                Transfers Overall transfer level: Modified independent Equipment used: Rolling walker (2 wheeled) Transfers: Sit to/from Stand Sit to Stand: Modified independent (Device/Increase time)            Ambulation/Gait Ambulation/Gait assistance: Supervision Gait Distance (Feet): 60 Feet Assistive device: Rolling walker (2 wheeled) Gait Pattern/deviations: Step-to pattern     General Gait Details: hopping on L LE no LOB, good safety with RW and endurance   Stairs             Wheelchair Mobility    Modified Rankin (Stroke Patients Only)       Balance Overall balance assessment: Modified Independent Sitting-balance support: Feet supported Sitting balance-Leahy Scale: Normal     Standing balance support: Bilateral upper extremity  supported Standing balance-Leahy Scale: Good Standing balance comment: UE support needed due to R BKA                            Cognition Arousal/Alertness: Awake/alert Behavior During Therapy: WFL for tasks assessed/performed Overall Cognitive Status: Within Functional Limits for tasks assessed                                        Exercises      General Comments        Pertinent Vitals/Pain Pain Assessment: No/denies pain    Home Living                      Prior Function            PT Goals (current goals can now be found in the care plan section) Acute Rehab PT Goals Patient Stated Goal: to walk independent PT Goal Formulation: With patient Time For Goal Achievement: 11/06/18 Potential to Achieve Goals: Good Progress towards PT goals: Progressing toward goals    Frequency    Min 3X/week      PT Plan Current plan remains appropriate    Co-evaluation              AM-PAC PT "6 Clicks" Mobility   Outcome Measure  Help needed turning from your back to  your side while in a flat bed without using bedrails?: None Help needed moving from lying on your back to sitting on the side of a flat bed without using bedrails?: None Help needed moving to and from a bed to a chair (including a wheelchair)?: None Help needed standing up from a chair using your arms (e.g., wheelchair or bedside chair)?: None Help needed to walk in hospital room?: None Help needed climbing 3-5 steps with a railing? : A Little 6 Click Score: 23    End of Session Equipment Utilized During Treatment: Gait belt Activity Tolerance: Patient tolerated treatment well Patient left: in bed;with call bell/phone within reach Nurse Communication: Mobility status PT Visit Diagnosis: Other abnormalities of gait and mobility (R26.89)     Time: 1315-1330 PT Time Calculation (min) (ACUTE ONLY): 15 min  Charges:  $Gait Training: 8-22 mins                      Keith [name], PT, GCS 11/02/18,1:48 PM

## 2018-11-02 NOTE — Progress Notes (Signed)
PROGRESS NOTE  Keith [name] PHX:[id number] DOB: [birth year] DOA: 10/27/2018 PCP: System, Pcp Not In  HPI/Recap of past 24 hours: Keith [name] a 53 y.o.malewith medical history significant ofDM2, HIV, HTN, s/p R BKA.  Patient presents to the ED with ongoing ulcer and foul smelling drainage from his R BKA stump. Seen recently by [name] on 7/16 for this. Started on doxycycline, but symptoms persisted / worsened. Seen again in ED on 7/19 and started on Keflex. Symptoms again persisted and worsened with increased foul smelling drainage. Denies any leg trauma.  Drainage noted on his right stump.  Seen by ortho, [name]. Recommendations to continue IV antibiotics for abscess/ulcer of R transtibial amputation with regular dressing changes.  11/01/18: Patient seen and examined at his bedside.  Pain at the right transtibial abscess/infection improved with pain medications.  Sed rate still significantly elevated 130 from 140, continue IV antibiotics.    Assessment/Plan: Principal Problem:   Amputation stump infection ([place]) Active Problems:   Essential hypertension   HIV disease ([place])   Acquired absence of right leg below knee (HCC)   Type II diabetes mellitus with renal manifestations (HCC)  Abscess/ulcer of R transtibial amputation  Failed outpatient antibiotics Elevated inflammatory markers CRP 2.0>> 0.8 ESR 140 >> 130 Positive MRSA screening in 08/07/2018 and on 10/29/2018 Continue IV vancomycin Repeat inflammatory markers tomorrow 11/02/18 Continue recommendations per orthopedic surgery: washing the wound with soap and water twice a day packing the wound open with 4 x 4 gauze.  Continue the IV antibiotics. Will follow-up with [name] in the office.  Patient will follow-up with biotech for a new socket fitting. Optimize pain control  Elevated inflammatory markers, likely secondary to above Management as stated above Repeat levels am  Uncontrolled HTN Continue po  hydralazine 10 mg TID Continue Norvasc 10 mg daily  Resolving Intractable nausea, possibly related to uncontrolled hyperglycemia Continue to treat symptomatically with IV Zofran for nausea and IV Phenergan for refractory nausea and vomiting.  AKI on CKD 2, improving Baseline creatinine appears to be 1.1 Cr today 1.2 C/w gentle IV fluid hydration Presented with creatinine of 1.85 with GFR 47 Continue to avoid nephrotoxins  Type 2 diabetes with hyperglycemia Continue insulin coverage Continue IV fluid hydration  HIV  Continue HAART Continue dapsone prophylactically  Chronic normocytic anemia Hemoglobin appears to be close to baseline 10.5 with MCV 81 No sign of overt bleeding      DVT prophylaxis: Lovenox sq daily Code Status: Full Family Communication: No family in room Disposition Plan:  DC home possibly in 1-2 days when anti-inflammatory markers have improved.  Consults called:  Wound care specialist.,  Orthopedic surgery, [name].   Objective: Vitals:   11/01/18 2359 11/02/18 0535 11/02/18 0553 11/02/18 0904  BP: 137/78 (!) 158/99  (!) 165/94  Pulse: 64   67  Resp: 16   16  Temp: 98 F (36.7 C)  97.6 F (36.4 C) 97.9 F (36.6 C)  TempSrc: Oral  Oral Oral  SpO2: 100%   100%  Weight:   111.9 kg   Height:        Intake/Output Summary (Last 24 hours) at 11/02/2018 1346 Last data filed at 11/02/2018 1106 Gross per 24 hour  Intake 3370.22 ml  Output 2250 ml  Net 1120.22 ml   Filed Weights   10/31/18 0602 11/01/18 0549 11/02/18 0553  Weight: 112.4 kg 112.9 kg 111.9 kg    Exam:  . General: 53 y.o. year-old male well-developed  well-nourished in no acute distress.  Alert and oriented x3.   . Cardiovascular: Negative rate and rhythm no rubs or gallops no JVD or thyromegaly noted.   Marland Kitchen Respiratory: Clear to auscultation with no wheezes or rales. Poor inspiratory effort. . Abdomen: Soft nontender nondistended with normal bowel sounds present. .  Musculoskeletal:  right stump in surgical dressing. Marland Kitchen Psychiatry: Mood is appropriate for condition and setting.   Data Reviewed: CBC: Recent Labs  Lab 10/27/18 2355 11/01/18 0644  WBC 6.4 6.0  NEUTROABS  --  3.3  HGB 9.9* 10.5*  HCT 32.3* 33.4*  MCV 83.7 81.5  PLT 285 299   Basic Metabolic Panel: Recent Labs  Lab 10/28/18 0900 10/28/18 1148 10/29/18 0801 10/30/18 0409 11/01/18 0644  NA 135 136 135 136 134*  K 3.4* 3.5 4.1 4.2 4.0  CL 101 102 104 105 103  CO2 _0 GLUCOSE 164* 208* 244* 206* 187*  BUN _1 CREATININE 1.43* 1.47* 1.20 1.15 1.29*  CALCIUM 9.0 8.9 9.0 8.8* 8.8*   GFR: Estimated Creatinine Clearance: 81.7 mL/min (A) (by C-G formula based on SCr of 1.29 mg/dL (H)). Liver Function Tests: No results for input(s): AST, ALT, ALKPHOS, BILITOT, PROT, ALBUMIN in the last 168 hours. No results for input(s): LIPASE, AMYLASE in the last 168 hours. No results for input(s): AMMONIA in the last 168 hours. Coagulation Profile: No results for input(s): INR, PROTIME in the last 168 hours. Cardiac Enzymes: No results for input(s): CKTOTAL, CKMB, CKMBINDEX, TROPONINI in the last 168 hours. BNP (last 3 results) No results for input(s): PROBNP in the last 8760 hours. HbA1C: No results for input(s): HGBA1C in the last 72 hours. CBG: Recent Labs  Lab 11/01/18 1142 11/01/18 1552 11/01/18 2054 11/02/18 0738 11/02/18 1201  GLUCAP 212* 123* 179* 225* 205*   Lipid Profile: No results for input(s): CHOL, HDL, LDLCALC, TRIG, CHOLHDL, LDLDIRECT in the last 72 hours. Thyroid Function Tests: No results for input(s): TSH, T4TOTAL, FREET4, T3FREE, THYROIDAB in the last 72 hours. Anemia Panel: No results for input(s): VITAMINB12, FOLATE, FERRITIN, TIBC, IRON, RETICCTPCT in the last 72 hours. Urine analysis:    Component Value Date/Time   COLORURINE YELLOW 10/25/2018 0605   APPEARANCEUR HAZY (A) 10/25/2018 0605   LABSPEC 1.017 10/25/2018 0605    PHURINE 5.0 10/25/2018 0605   GLUCOSEU >=500 (A) 10/25/2018 0605   HGBUR MODERATE (A) 10/25/2018 0605   BILIRUBINUR NEGATIVE 10/25/2018 0605   BILIRUBINUR negative 09/07/2018 1034   KETONESUR NEGATIVE 10/25/2018 0605   PROTEINUR 100 (A) 10/25/2018 0605   UROBILINOGEN 1.0 09/07/2018 1034   UROBILINOGEN 1.0 01/01/2017 1032   NITRITE NEGATIVE 10/25/2018 0605   LEUKOCYTESUR NEGATIVE 10/25/2018 0605   Sepsis Labs: _2 (procalcitonin:4,lacticidven:4)  ) Recent Results (from the past 240 hour(s))  SARS Coronavirus 2 (CEPHEID - Performed in [place] hospital lab), Hosp Order     Status: None   Collection Time: 10/25/18  7:50 AM   Specimen: Nasopharyngeal Swab  Result Value Ref Range Status   SARS Coronavirus 2 NEGATIVE NEGATIVE Final    Comment: (NOTE) If result is NEGATIVE SARS-CoV-2 target nucleic acids are NOT DETECTED. The SARS-CoV-2 RNA is generally detectable in upper and lower  respiratory specimens during the acute phase of infection. The lowest  concentration of SARS-CoV-2 viral copies this assay can detect is 250  copies / mL. A negative result does not preclude SARS-CoV-2 infection  and should not be used as the sole basis for  treatment or other  patient management decisions.  A negative result may occur with  improper specimen collection / handling, submission of specimen other  than nasopharyngeal swab, presence of viral mutation(s) within the  areas targeted by this assay, and inadequate number of viral copies  (<250 copies / mL). A negative result must be combined with clinical  observations, patient history, and epidemiological information. If result is POSITIVE SARS-CoV-2 target nucleic acids are DETECTED. The SARS-CoV-2 RNA is generally detectable in upper and lower  respiratory specimens dur ing the acute phase of infection.  Positive  results are indicative of active infection with SARS-CoV-2.  Clinical  correlation with patient history and other  diagnostic information is  necessary to determine patient infection status.  Positive results do  not rule out bacterial infection or co-infection with other viruses. If result is PRESUMPTIVE POSTIVE SARS-CoV-2 nucleic acids MAY BE PRESENT.   A presumptive positive result was obtained on the submitted specimen  and confirmed on repeat testing.  While 2019 novel coronavirus  (SARS-CoV-2) nucleic acids may be present in the submitted sample  additional confirmatory testing may be necessary for epidemiological  and / or clinical management purposes  to differentiate between  SARS-CoV-2 and other Sarbecovirus currently known to infect humans.  If clinically indicated additional testing with an alternate test  methodology [id number]) is advised. The SARS-CoV-2 RNA is generally  detectable in upper and lower respiratory sp ecimens during the acute  phase of infection. The expected result is Negative. Fact Sheet for Patients:  StrictlyIdeas.no Fact Sheet for Healthcare Providers: BankingDealers.co.za This test is not yet approved or cleared by the [place] FDA and has been authorized for detection and/or diagnosis of SARS-CoV-2 by FDA under an Emergency Use Authorization (EUA).  This EUA will remain in effect (meaning this test can be used) for the duration of the COVID-19 declaration under Section 564(b)(1) of the Act, 21 U.S.C. section 360bbb-3(b)(1), unless the authorization is terminated or revoked sooner. Performed at [facility], [place] [address], [place], [place] [address]   SARS Coronavirus 2 (CEPHEID - Performed in [place] hospital lab), Hosp Order     Status: None   Collection Time: 10/28/18  5:30 AM   Specimen: Nasopharyngeal Swab  Result Value Ref Range Status   SARS Coronavirus 2 NEGATIVE NEGATIVE Final    Comment: (NOTE) If result is NEGATIVE SARS-CoV-2 target nucleic acids are NOT DETECTED. The SARS-CoV-2 RNA is  generally detectable in upper and lower  respiratory specimens during the acute phase of infection. The lowest  concentration of SARS-CoV-2 viral copies this assay can detect is 250  copies / mL. A negative result does not preclude SARS-CoV-2 infection  and should not be used as the sole basis for treatment or other  patient management decisions.  A negative result may occur with  improper specimen collection / handling, submission of specimen other  than nasopharyngeal swab, presence of viral mutation(s) within the  areas targeted by this assay, and inadequate number of viral copies  (<250 copies / mL). A negative result must be combined with clinical  observations, patient history, and epidemiological information. If result is POSITIVE SARS-CoV-2 target nucleic acids are DETECTED. The SARS-CoV-2 RNA is generally detectable in upper and lower  respiratory specimens dur ing the acute phase of infection.  Positive  results are indicative of active infection with SARS-CoV-2.  Clinical  correlation with patient history and other diagnostic information is  necessary to determine patient infection status.  Positive results do  not rule out bacterial infection or co-infection with other viruses. If result is PRESUMPTIVE POSTIVE SARS-CoV-2 nucleic acids MAY BE PRESENT.   A presumptive positive result was obtained on the submitted specimen  and confirmed on repeat testing.  While 2019 novel coronavirus  (SARS-CoV-2) nucleic acids may be present in the submitted sample  additional confirmatory testing may be necessary for epidemiological  and / or clinical management purposes  to differentiate between  SARS-CoV-2 and other Sarbecovirus currently known to infect humans.  If clinically indicated additional testing with an alternate test  methodology [phone]) is advised. The SARS-CoV-2 RNA is generally  detectable in upper and lower respiratory sp ecimens during the acute  phase of infection.  The expected result is Negative. Fact Sheet for Patients:  StrictlyIdeas.no Fact Sheet for Healthcare Providers: BankingDealers.co.za This test is not yet approved or cleared by the [place] FDA and has been authorized for detection and/or diagnosis of SARS-CoV-2 by FDA under an Emergency Use Authorization (EUA).  This EUA will remain in effect (meaning this test can be used) for the duration of the COVID-19 declaration under Section 564(b)(1) of the Act, 21 U.S.C. section 360bbb-3(b)(1), unless the authorization is terminated or revoked sooner. Performed at [facility], [place] [address] [place], [place] [address]   MRSA PCR Screening     Status: Abnormal   Collection Time: 10/29/18  6:16 PM   Specimen: Nasopharyngeal  Result Value Ref Range Status   MRSA by PCR POSITIVE (A) NEGATIVE Final    Comment:        The GeneXpert MRSA Assay (FDA approved for NASAL specimens only), is one component of a comprehensive MRSA colonization surveillance program. It is not intended to diagnose MRSA infection nor to guide or monitor treatment for MRSA infections. RESULT CALLED TO, READ BACK BY AND VERIFIED WITH: Marlou Sa RN 10/29/18 2038 JDW Performed at [facility], [address], [place], [place] [address]       Studies: No results found.  Scheduled Meds: . amLODipine  10 mg Oral Daily  . bictegravir-emtricitabine-tenofovir AF  1 tablet Oral Daily  . [START ON 11/03/2018] buprenorphine  1 patch Transdermal Weekly  . Chlorhexidine Gluconate Cloth  6 each Topical Q0600  . collagenase   Topical BID  . dapsone  100 mg Oral Daily  . enoxaparin (LOVENOX) injection  40 mg Subcutaneous Daily  . escitalopram  20 mg Oral Daily  . hydrALAZINE  10 mg Oral Q8H  . insulin aspart  0-15 Units Subcutaneous TID WC  . insulin aspart  0-5 Units Subcutaneous QHS  . insulin glargine  20 Units Subcutaneous Daily  . mupirocin ointment  1  application Nasal BID  . oxybutynin  5 mg Oral BID  . potassium chloride  40 mEq Oral BID    Continuous Infusions: . sodium chloride 75 mL/hr at 11/02/18 0742  . vancomycin 200 mL/hr at 11/02/18 0600     LOS: 5 days     Kayleen Memos, MD [facility] Pager [phone]  If 7PM-7AM, please contact night-coverage [email or web address] Password [email or web address] 11/02/2018, 1:46 PM

## 2018-11-03 LAB — BASIC METABOLIC PANEL
Anion gap: 7 (ref 5–15)
BUN: 16 mg/dL (ref 6–20)
CO2: 22 mmol/L (ref 22–32)
Calcium: 9.1 mg/dL (ref 8.9–10.3)
Chloride: 103 mmol/L (ref 98–111)
Creatinine, Ser: 1.32 mg/dL — ABNORMAL HIGH (ref 0.61–1.24)
GFR calc Af Amer: >60 mL/min (ref >60)
GFR calc non Af Amer: >60 mL/min (ref >60)
Glucose, Bld: 121 mg/dL — ABNORMAL HIGH (ref 70–99)
Potassium: 4.6 mmol/L (ref 3.5–5.1)
Sodium: 132 mmol/L — ABNORMAL LOW (ref 135–145)

## 2018-11-03 LAB — CBC
HCT: 34.2 % — ABNORMAL LOW (ref 39.0–52.0)
Hemoglobin: 10.7 g/dL — ABNORMAL LOW (ref 13.0–17.0)
MCH: 25.9 pg — ABNORMAL LOW (ref 26.0–34.0)
MCHC: 31.3 g/dL (ref 30.0–36.0)
MCV: 82.8 fL (ref 80.0–100.0)
Platelets: 409 10*3/uL — ABNORMAL HIGH (ref 150–400)
RBC: 4.13 MIL/uL — ABNORMAL LOW (ref 4.22–5.81)
RDW: 13.1 % (ref 11.5–15.5)
WBC: 6.9 10*3/uL (ref 4.0–10.5)
nRBC: 0 % (ref 0.0–0.2)

## 2018-11-03 LAB — GLUCOSE, CAPILLARY: Glucose-Capillary: 172 mg/dL — ABNORMAL HIGH (ref 70–99)

## 2018-11-03 MED ORDER — AMOXICILLIN-POT CLAVULANATE 875-125 MG PO TABS
1.0000 | ORAL_TABLET | Freq: Two times a day (BID) | ORAL | Status: DC
  Administered 2018-11-03: 1 via ORAL
  Filled 2018-11-03: qty 1

## 2018-11-03 MED ORDER — DAPSONE 100 MG PO TABS: 100.0000 mg | ORAL_TABLET | Freq: Every day | ORAL | 0 refills | Status: DC

## 2018-11-03 MED ORDER — OXYCODONE HCL 10 MG PO TABS: 10.0000 mg | ORAL_TABLET | Freq: Every day | ORAL | 0 refills | Status: DC | PRN

## 2018-11-03 MED ORDER — AMOXICILLIN-POT CLAVULANATE 875-125 MG PO TABS: 1.0000 | ORAL_TABLET | Freq: Two times a day (BID) | ORAL | 0 refills | Status: AC

## 2018-11-03 MED ORDER — COLLAGENASE 250 UNIT/GM EX OINT: TOPICAL_OINTMENT | Freq: Two times a day (BID) | CUTANEOUS | 0 refills | Status: DC

## 2018-11-03 MED ORDER — HYDRALAZINE HCL 10 MG PO TABS: 10.0000 mg | ORAL_TABLET | Freq: Three times a day (TID) | ORAL | 0 refills | Status: DC

## 2018-11-03 NOTE — TOC Initial Note (Signed)
Transition of Care [facility]) - Initial/Assessment Note    Patient Details  Name: Keith [name] MRN: [id number] Date of Birth: 04/03/1966  [facility] CM/SW Contact:    Bethena Roys, RN Phone Number: 11/03/2018, 11:57 AM  Clinical Narrative:   Pt presented for infection of amputated stump. Plan to transition to mothers home at transition. Pt has PCP- has been noncompliant with visits. CM did stress to the patient the importance of visits to physician. Patient states he will go to his mothers home and he will have assistance of his mother and sister. CM discussed pharmacy with patient [facility] Hospital follow up appointment scheduled and placed on AVS. Choice offered to patient for [facility] and referral provided to [facility]. SOC to begin within 24-48 hours. No further needs from CM at this time.                   Expected Discharge Plan: [place] Barriers to Discharge: No Barriers Identified   Patient Goals and CMS Choice   CMS Medicare.gov Compare Post Acute Care list provided to:: Patient Choice offered to / list presented to : Patient  Expected Discharge Plan and Services Expected Discharge Plan: [place] In-house Referral: NA Discharge Planning Services: CM Consult, Follow-up appt scheduled, [place] Acute Care Choice: Home Health, Durable Medical Equipment Living arrangements for the past 2 months: Single Family Home Expected Discharge Date: 11/03/18               DME Arranged: Gilford Rile rolling DME Agency: [employer] Date DME Agency Contacted: 11/03/18 Time DME Agency Contacted: 9 Representative spoke with at DME Agency: [place] Arranged: RN, Disease Management, PT [place] Agency: [place] Date [place]: 11/03/18 Time [place]: 110 Representative spoke with at [place]  Arrangements/Services Living arrangements for the past 2 months: [place] Lives with:: Parents Patient language and need for interpreter reviewed:: Yes Do you feel safe going back to the place where you live?: Yes      Need for Family Participation in Patient Care: Yes (Comment) Care giver support system in place?: Yes (comment)   Criminal Activity/Legal Involvement Pertinent to Current Situation/Hospitalization: No - Comment as needed  Activities of Daily Living      Permission Sought/Granted Permission sought to share information with : Facility Sport and exercise psychologist, Family Supports, PCP Permission granted to share information with : Yes, Verbal Permission Granted     Permission granted to share info w AGENCY: [facility], Patient [place]- PCP Kathe Becton        Emotional Assessment Appearance:: Appears stated age Attitude/Demeanor/Rapport: Engaged Affect (typically observed): Accepting Orientation: : Oriented to Self, Oriented to Place, Oriented to  Time, Oriented to Situation Alcohol / Substance Use: Not Applicable Psych Involvement: No (comment)  Admission diagnosis:  Cough [R05] Hyperglycemia [R73.9] Cellulitis of right lower extremity [L03.115] Amputation stump infection ([place]) [T87.40] Patient Active Problem List   Diagnosis Date Noted  . Amputation stump infection ([place]) 10/28/2018  . Type II diabetes mellitus with renal manifestations ([place]) 08/07/2018  . Uncontrolled type 2 diabetes mellitus with hyperosmolar nonketotic hyperglycemia ([place]) 08/07/2018  . Non-pressure chronic ulcer of right calf limited to breakdown of skin ([place]) 07/06/2018  . Type 2 diabetes mellitus without complication, without long-term current use of insulin ([place]) 06/15/2018  . Chronic low back pain without sciatica 06/15/2018  .  Wound of left leg 06/15/2018  . Idiopathic chronic venous hypertension of left lower extremity with ulcer and inflammation ([place])   .  Venous stasis ulcer of left calf limited to breakdown of skin without varicose veins ([place]) 04/23/2018  . Acquired absence of right leg below knee ([place]) 06/13/2017  . Acute renal failure superimposed on stage 3 chronic kidney disease ([place]) 03/15/2017  . Diabetic polyneuropathy associated with type 2 diabetes mellitus ([place]) 01/23/2017  . AKI (acute kidney injury) ([place]) 09/28/2016  . Nausea vomiting and diarrhea 09/28/2016  . Onychomycosis of multiple toenails with type 2 diabetes mellitus ([place]) 08/29/2015  . MRSA carrier 04/20/2014  . Penile wart 02/22/2014  . HIV disease ([place])   . Insulin-requiring or dependent type II diabetes mellitus ([place]) 02/04/2014  . Dental anomaly 11/20/2012  . Arthritis of right knee 02/23/2012  . Hyperlipidemia 11/10/2011  . Chronic pain 08/07/2011  . Meralgia paraesthetica 04/23/2011  . ERECTILE DYSFUNCTION 08/22/2008  . Essential hypertension 05/19/2008   PCP:  System, Pcp Not In Pharmacy:   [facility], [place] [address] [place] [address] Phone: [phone] Fax: [place], [place] - [address] [place] [address] Phone: [phone] Fax: [phone]     Social Determinants of Health (SDOH) Interventions    Readmission Risk Interventions Readmission Risk Prevention Plan 11/03/2018  Transportation Screening Complete  PCP or Specialist Appt within 3-5 Days Complete  HRI or [place] Complete  Social Work Consult for [place] Planning/Counseling Complete  Palliative Care Screening Not Applicable  Medication Review Press photographer) Complete  Some recent data might be hidden

## 2018-11-03 NOTE — Discharge Summary (Addendum)
Discharge Summary  Keith [name] DQQ:[id number] DOB: [birth year]  PCP: System, Pcp Not In  Admit date: 10/27/2018 Discharge date: 11/03/2018  Time spent: 35 minutes  Recommendations for Outpatient Follow-up:  1. Follow-up with infectious disease 2. Follow-up with orthopedic surgery 3. Follow-up with your primary care provider within a week 4. Take your medications as prescribed 5. Continue physical therapy 6. Fall precautions  Discharge Diagnoses:  Active Hospital Problems   Diagnosis Date Noted  . Amputation stump infection ([place]) 10/28/2018  . Type II diabetes mellitus with renal manifestations ([place]) 08/07/2018  . Acquired absence of right leg below knee ([place]) 06/13/2017  . HIV disease ([place])   . Essential hypertension 05/19/2008    Resolved Hospital Problems   Diagnosis Date Noted Date Resolved  . Pressure ulcer of BKA stump ([place]) 10/28/2018 10/28/2018  . Hyponatremia 11/10/2011 10/28/2018    Discharge Condition: Stable  Diet recommendation: Resume previous diet  Vitals:   11/03/18 0601 11/03/18 0827  BP: (!) 173/106 (!) 162/100  Pulse: 66   Resp: 20   Temp: 97.8 F (36.6 C)   SpO2: 100%     History of present illness:   Keith [name] a 53 y.o.malewith medical history significant ofDM2, HIV, HTN, s/p R BKA.  Patient presents to the ED with ongoing ulcer and foul smelling drainage from his R BKA stump. Seen recently by [name] on 10/22/18 for this. Started on doxycycline, but symptoms persisted / worsened. Seen again in ED on 10/25/18 and started on Keflex. Symptoms again persisted and worsened with increased foul smelling drainage. Denies any leg trauma.  Drainage noted on his right stump.  Seen by ortho, [name]. Recommendations to continue antibiotics for abscess/ulcer of R transtibial amputation with regular dressing changes.  11/03/18: Patient was seen and examined at his bedside.  No acute events overnight.  His pain is well controlled on current  pain management.  Vital signs are reviewed and are stable.  On the day of discharge, the patient was hemodynamically stable.  He will need to follow-up with his orthopedic surgeon [name] and infectious disease posthospitalization.  Patient understands and agrees to plan.  Patient is discharging to home, stating he will live with his mother.   Hospital Course:  Principal Problem:   Amputation stump infection ([place]) Active Problems:   Essential hypertension   HIV disease ([place])   Acquired absence of right leg below knee ([facility])   Type II diabetes mellitus with renal manifestations ([facility])  Abscess/ulcer of R transtibial amputation  Failed outpatient antibiotics Elevated inflammatory markers CRP 2.0>> 0.8 ESR 140 >> 130 Positive MRSA screening in 08/07/2018 and on 10/29/2018 Completed 7 days IV vancomycin Switched to Augmentin 875/125 mg BID x 10 days on 11/03/2018. Continue recommendations per orthopedic surgery: washing the wound with soap and water twice a day packing the wound open with 4 x 4 gauze. Continue antibiotics. Will follow-up with [name] in the office.  Patient will follow-up with biotech for a new socket fitting. Optimize pain control Uses a pain patch at home Prescribed 5 tablets of oxycodone, 5 mg daily prn for breakthrough pain.  Elevated inflammatory markers, likely secondary to above Management as stated above  Uncontrolled HTN, possibly contributed by pain Continue po hydralazine 10 mg TID Continue Norvasc 10 mg daily Optimize pain control  Resolved Intractable nausea, possibly related to uncontrolled hyperglycemia Recent Blood sugar have been well controlled  AKI on CKD 2, improving Baseline creatinine appears to be 1.1 Cr today 1.2>>  1.32 on 11/03/18 Presented with creatinine of 1.85 with GFR 47 Receiving gentle IV fluid hydration Repeat BMP on Monday, November 09, 2018 Follow-up with your primary care provider Avoid dehydration Avoid nephrotoxins   Type 2 diabetes uncontrolled with hyperglycemia Hg A1C >15.5 on 09/07/18 Continue home insulin coverage Stop glipizide and metformin Follow up wit your PCP within a week  HIV  Continue HAART Continue dapsone prophylactically Follow up with Infectious disease  Chronic normocytic anemia Hemoglobin appears to be close to baseline 10.7 with MCV 82 No sign of overt bleeding   Code Status:Full   Consults called: Wound care specialist.,  Orthopedic surgery, [name].   Discharge Exam: BP (!) 162/100   Pulse 66   Temp 97.8 F (36.6 C) (Oral)   Resp 20   Ht _0  (1.753 m)   Wt 111.8 kg   SpO2 100%   BMI 36.40 kg/m    . General: 53 y.o. year-old male well developed well nourished in no acute distress.  Alert and oriented x3. . Cardiovascular: Regular rate and rhythm with no rubs or gallops.  No thyromegaly or JVD noted.   Marland Kitchen Respiratory: Clear to auscultation with no wheezes or rales. Good inspiratory effort. . Abdomen: Soft nontender nondistended with normal bowel sounds x4 quadrants. . Musculoskeletal: Right below the knee amputation, wrapped in surgical dressing.  Dressing appears clean with no drainage noted. Marland Kitchen Psychiatry: Mood is appropriate for condition and setting  Discharge Instructions You were cared for by a hospitalist during your hospital stay. If you have any questions about your discharge medications or the care you received while you were in the hospital after you are discharged, you can call the unit and asked to speak with the hospitalist on call if the hospitalist that took care of you is not available. Once you are discharged, your primary care physician will handle any further medical issues. Please note that NO REFILLS for any discharge medications will be authorized once you are discharged, as it is imperative that you return to your primary care physician (or establish a relationship with a primary care physician if you do not have one) for your  aftercare needs so that they can reassess your need for medications and monitor your lab values.   Allergies as of 11/03/2018      Reactions   Sulfur Itching   Patient stated he's allergic to "sulfur" AND "sulfa"   Sulfa Antibiotics Itching      Medication List    STOP taking these medications   cephALEXin 250 MG capsule Commonly known as: KEFLEX   doxycycline 100 MG tablet Commonly known as: ADOXA   glipiZIDE 10 MG tablet Commonly known as: GLUCOTROL   metFORMIN 1000 MG tablet Commonly known as: GLUCOPHAGE   tadalafil 20 MG tablet Commonly known as: CIALIS     TAKE these medications   acetaminophen 325 MG tablet Commonly known as: Tylenol Take 2 tablets (650 mg total) by mouth every 6 (six) hours as needed.   amLODipine 10 MG tablet Commonly known as: NORVASC Take 1 tablet (10 mg total) by mouth daily.   amoxicillin-clavulanate 875-125 MG tablet Commonly known as: AUGMENTIN Take 1 tablet by mouth every 12 (twelve) hours for 10 days.   bictegravir-emtricitabine-tenofovir AF 50-200-25 MG Tabs tablet Commonly known as: BIKTARVY Take 1 tablet by mouth daily.   blood glucose meter kit and supplies Dispense based on patient and insurance preference. Use up to four times daily as directed. (FOR ICD-10 E10.9, E11.9).  buprenorphine 20 MCG/HR Ptwk patch Commonly known as: BUTRANS Place 1 patch onto the skin.   collagenase ointment Commonly known as: SANTYL Apply topically 2 (two) times daily at 10 AM and 5 PM.   dapsone 100 MG tablet Take 1 tablet (100 mg total) by mouth daily.   Easy Comfort Pen Needles 31G X 5 MM Misc Generic drug: Insulin Pen Needle INJECT 20 UNITS DAILY AS DIRECTED.   escitalopram 20 MG tablet Commonly known as: LEXAPRO Take 20 mg by mouth daily.   glucose blood test strip Commonly known as: Cool Blood Glucose Test Strips Use as instructed   hydrALAZINE 10 MG tablet Commonly known as: APRESOLINE Take 1 tablet (10 mg total) by  mouth every 8 (eight) hours.   Insulin Glargine 100 UNIT/ML Solostar Pen Commonly known as: Lantus SoloStar Inject 30 Units into the skin daily.   insulin lispro 100 UNIT/ML injection Commonly known as: HUMALOG Inject 10 units under skin 3 times a day, with meals, for blood glucose levels greater than 125.   Insulin Syringes (Disposable) U-100 0.3 ML Misc 1 Syringe by Does not apply route 3 (three) times daily.   onetouch ultrasoft lancets Use as instructed   oxybutynin 5 MG tablet Commonly known as: DITROPAN TAKE 1 TABLET (5 MG TOTAL) BY MOUTH 2 (TWO) TIMES DAILY.   Oxycodone HCl 10 MG Tabs Take 1 tablet (10 mg total) by mouth daily as needed for up to 5 doses for severe pain or breakthrough pain (pain). What changed:   when to take this  reasons to take this            Durable Medical Equipment  (From admission, onward)         Start     Ordered   11/03/18 1027  For home use only DME Walker rolling  Once    Comments: 5" wheels  Question:  Patient needs a walker to treat with the following condition  Answer:  Ambulatory dysfunction   11/03/18 1027         Allergies  Allergen Reactions  . Sulfur Itching    Patient stated he's allergic to "sulfur" AND "sulfa"  . Sulfa Antibiotics Itching   Follow-up Information    [name], MD Follow up in 1 week(s).   Specialty: Orthopedic Surgery Contact information: [place] [address] [place]. Call in 1 day(s).   Why: Please call for a post hospital follow-up appointment within a week. Contact information: [place] [address] [phone]       [name], MD. Call in 1 day(s).   Specialty: Infectious Diseases Why: Please call for a post hospital follow-up appointment. Contact information: [place] [address] [phone]            The  results of significant diagnostics from this hospitalization (including imaging, microbiology, ancillary and laboratory) are listed below for reference.    Significant Diagnostic Studies: Dg Chest 2 View  Result Date: 10/11/2018 CLINICAL DATA:  Hyperglycemia.  Weakness. EXAM: CHEST - 2 VIEW COMPARISON:  Chest x-ray 01/26/2018 FINDINGS: Examination is significantly limited by large amount of artifact from the EKG wires which are right in the middle of the chest. The heart is mildly enlarged but stable. The mediastinal and hilar contours are grossly normal and stable. No gross acute pulmonary process but examination limited. No large  pleural effusions or pneumothorax. IMPRESSION: 1. Very limited chest x-ray. 2. No obvious/gross acute cardiopulmonary findings. Electronically Signed   By: Marijo Sanes M.D.   On: 10/11/2018 15:51   Ct Head Wo Contrast  Result Date: 10/11/2018 CLINICAL DATA:  Weakness. Fatigue and malaise. EXAM: CT HEAD WITHOUT CONTRAST TECHNIQUE: Contiguous axial images were obtained from the base of the skull through the vertex without intravenous contrast. COMPARISON:  Head CT 08/06/2016 FINDINGS: Brain: No intracranial hemorrhage, mass effect, or midline shift. No hydrocephalus. The basilar cisterns are patent. No evidence of territorial infarct or acute ischemia. No extra-axial or intracranial fluid collection. Vascular: No hyperdense vessel. Skull: No fracture or focal lesion. Sinuses/Orbits: No acute findings. Remote left medial orbital wall fracture. Bilateral lens extraction. Other: None. IMPRESSION: No acute intracranial abnormality. Electronically Signed   By: Keith Rake M.D.   On: 10/11/2018 19:20   US Renal  Result Date: 10/22/2018 CLINICAL DATA:  Acute superimposed upon chronic kidney disease. EXAM: RENAL / URINARY TRACT ULTRASOUND COMPLETE COMPARISON:  09/28/2016. FINDINGS: Right Kidney: Renal measurements: Approximately 13.6 x 5.2 x 5.9 cm = volume: 218 mL . No  hydronephrosis. Well-preserved cortex. No shadowing calculi. Borderline to mild increased parenchymal echotexture. No focal parenchymal abnormality. Left Kidney: Renal measurements: Approximately 13.3 x 6.2 x 6.0 cm = volume: 259 mL. No hydronephrosis. Well-preserved cortex. No shadowing calculi. Borderline to mild increased parenchymal echotexture. No focal parenchymal abnormality. Bladder: Normal for degree of bladder distension. BILATERAL ureteral jets visualized at color Doppler evaluation. No interval change since the prior ultrasound in June, 2018. IMPRESSION: 1. Borderline mild increased parenchymal echotexture involving both kidneys as can be seen in patients with medical renal disease. 2. Otherwise normal examination and no interval change since June, 2018. Electronically Signed   By: Evangeline Dakin M.D.   On: 10/22/2018 08:07   Dg Chest Port 1 View  Result Date: 10/28/2018 CLINICAL DATA:  Cough EXAM: PORTABLE CHEST 1 VIEW COMPARISON:  10/25/2018 FINDINGS: Study is an exception folder currently. There is no edema, consolidation, effusion, or pneumothorax. Normal heart size and stable aortic tortuosity. IMPRESSION: Stable exam.  No acute finding. Electronically Signed   By: Monte Fantasia M.D.   On: 10/28/2018 05:41   Dg Chest Port 1 View  Result Date: 10/25/2018 CLINICAL DATA:  Headache and cough for 2 weeks. EXAM: PORTABLE CHEST 1 VIEW COMPARISON:  Chest x-rays dated 10/11/2018 and 01/26/2018. FINDINGS: Heart size is upper normal, stable. Overall cardiomediastinal silhouette is stable. Lungs are clear. No pleural effusion or pneumothorax seen. Osseous structures about the chest are unremarkable. IMPRESSION: No active disease. No evidence of pneumonia or pulmonary edema. Electronically Signed   By: Franki Cabot M.D.   On: 10/25/2018 06:42   Dg Finger Middle Left  Result Date: 10/25/2018 CLINICAL DATA:  Left middle finger wound. EXAM: LEFT MIDDLE FINGER 2+V COMPARISON:  None. FINDINGS: There  is no evidence of fracture or dislocation. There is no evidence of arthropathy or other focal bone abnormality. No lytic destruction is noted. Diffuse soft tissue swelling is noted concerning for infection. IMPRESSION: No fracture or dislocation is noted. Diffuse soft tissue swelling of the left middle finger is noted concerning for infection. Electronically Signed   By: Marijo Conception M.D.   On: 10/25/2018 08:28    Microbiology: Recent Results (from the past 240 hour(s))  SARS Coronavirus 2 (CEPHEID - Performed in [place] lab), Hosp Order     Status: None   Collection Time: 10/25/18  7:50  AM   Specimen: Nasopharyngeal Swab  Result Value Ref Range Status   SARS Coronavirus 2 NEGATIVE NEGATIVE Final    Comment: (NOTE) If result is NEGATIVE SARS-CoV-2 target nucleic acids are NOT DETECTED. The SARS-CoV-2 RNA is generally detectable in upper and lower  respiratory specimens during the acute phase of infection. The lowest  concentration of SARS-CoV-2 viral copies this assay can detect is 250  copies / mL. A negative result does not preclude SARS-CoV-2 infection  and should not be used as the sole basis for treatment or other  patient management decisions.  A negative result may occur with  improper specimen collection / handling, submission of specimen other  than nasopharyngeal swab, presence of viral mutation(s) within the  areas targeted by this assay, and inadequate number of viral copies  (<250 copies / mL). A negative result must be combined with clinical  observations, patient history, and epidemiological information. If result is POSITIVE SARS-CoV-2 target nucleic acids are DETECTED. The SARS-CoV-2 RNA is generally detectable in upper and lower  respiratory specimens dur ing the acute phase of infection.  Positive  results are indicative of active infection with SARS-CoV-2.  Clinical  correlation with patient history and other diagnostic information is  necessary to  determine patient infection status.  Positive results do  not rule out bacterial infection or co-infection with other viruses. If result is PRESUMPTIVE POSTIVE SARS-CoV-2 nucleic acids MAY BE PRESENT.   A presumptive positive result was obtained on the submitted specimen  and confirmed on repeat testing.  While 2019 novel coronavirus  (SARS-CoV-2) nucleic acids may be present in the submitted sample  additional confirmatory testing may be necessary for epidemiological  and / or clinical management purposes  to differentiate between  SARS-CoV-2 and other Sarbecovirus currently known to infect humans.  If clinically indicated additional testing with an alternate test  methodology [phone]) is advised. The SARS-CoV-2 RNA is generally  detectable in upper and lower respiratory sp ecimens during the acute  phase of infection. The expected result is Negative. Fact Sheet for Patients:  StrictlyIdeas.no Fact Sheet for Healthcare Providers: BankingDealers.co.za This test is not yet approved or cleared by the [place] FDA and has been authorized for detection and/or diagnosis of SARS-CoV-2 by FDA under an Emergency Use Authorization (EUA).  This EUA will remain in effect (meaning this test can be used) for the duration of the COVID-19 declaration under Section 564(b)(1) of the Act, 21 U.S.C. section 360bbb-3(b)(1), unless the authorization is terminated or revoked sooner. Performed at [facility], [place] [address] [place],  [address]   SARS Coronavirus 2 (CEPHEID - Performed in [place] hospital lab), Hosp Order     Status: None   Collection Time: 10/28/18  5:30 AM   Specimen: Nasopharyngeal Swab  Result Value Ref Range Status   SARS Coronavirus 2 NEGATIVE NEGATIVE Final    Comment: (NOTE) If result is NEGATIVE SARS-CoV-2 target nucleic acids are NOT DETECTED. The SARS-CoV-2 RNA is generally detectable in upper and lower   respiratory specimens during the acute phase of infection. The lowest  concentration of SARS-CoV-2 viral copies this assay can detect is 250  copies / mL. A negative result does not preclude SARS-CoV-2 infection  and should not be used as the sole basis for treatment or other  patient management decisions.  A negative result may occur with  improper specimen collection / handling, submission of specimen other  than nasopharyngeal swab, presence of viral mutation(s) within the  areas targeted by this assay, and inadequate number of viral copies  (<250 copies / mL). A negative result must be combined with clinical  observations, patient history, and epidemiological information. If result is POSITIVE SARS-CoV-2 target nucleic acids are DETECTED. The SARS-CoV-2 RNA is generally detectable in upper and lower  respiratory specimens dur ing the acute phase of infection.  Positive  results are indicative of active infection with SARS-CoV-2.  Clinical  correlation with patient history and other diagnostic information is  necessary to determine patient infection status.  Positive results do  not rule out bacterial infection or co-infection with other viruses. If result is PRESUMPTIVE POSTIVE SARS-CoV-2 nucleic acids MAY BE PRESENT.   A presumptive positive result was obtained on the submitted specimen  and confirmed on repeat testing.  While 2019 novel coronavirus  (SARS-CoV-2) nucleic acids may be present in the submitted sample  additional confirmatory testing may be necessary for epidemiological  and / or clinical management purposes  to differentiate between  SARS-CoV-2 and other Sarbecovirus currently known to infect humans.  If clinically indicated additional testing with an alternate test  methodology [id number]) is advised. The SARS-CoV-2 RNA is generally  detectable in upper and lower respiratory sp ecimens during the acute  phase of infection. The expected result is Negative. Fact  Sheet for Patients:  StrictlyIdeas.no Fact Sheet for Healthcare Providers: BankingDealers.co.za This test is not yet approved or cleared by the [place] FDA and has been authorized for detection and/or diagnosis of SARS-CoV-2 by FDA under an Emergency Use Authorization (EUA).  This EUA will remain in effect (meaning this test can be used) for the duration of the COVID-19 declaration under Section 564(b)(1) of the Act, 21 U.S.C. section 360bbb-3(b)(1), unless the authorization is terminated or revoked sooner. Performed at [facility], [place] [address], [place], [place] [address]   MRSA PCR Screening     Status: Abnormal   Collection Time: 10/29/18  6:16 PM   Specimen: Nasopharyngeal  Result Value Ref Range Status   MRSA by PCR POSITIVE (A) NEGATIVE Final    Comment:        The GeneXpert MRSA Assay (FDA approved for NASAL specimens only), is one component of a comprehensive MRSA colonization surveillance program. It is not intended to diagnose MRSA infection nor to guide or monitor treatment for MRSA infections. RESULT CALLED TO, READ BACK BY AND VERIFIED WITH: Marlou Sa RN 10/29/18 2038 JDW Performed at [facility], [address], [place], [place] [address]      Labs: Basic Metabolic Panel: Recent Labs  Lab 10/28/18 1148 10/29/18 0801 10/30/18 0409 11/01/18 0644 11/03/18 0518  NA 136 135 136 134* 132*  K 3.5 4.1 4.2 4.0 4.6  CL 102 104 105 103 103  CO2 _0 GLUCOSE 208* 244* 206* 187* 121*  BUN _1 CREATININE 1.47* 1.20 1.15 1.29* 1.32*  CALCIUM 8.9 9.0 8.8* 8.8* 9.1   Liver Function Tests: No results for input(s): AST, ALT, ALKPHOS, BILITOT, PROT, ALBUMIN in the last 168 hours. No results for input(s): LIPASE, AMYLASE in the last 168 hours. No results for input(s): AMMONIA in the last 168 hours. CBC: Recent Labs  Lab 10/27/18 2355 11/01/18 0644 11/03/18 0548  WBC 6.4 6.0  6.9  NEUTROABS  --  3.3  --   HGB 9.9* 10.5* 10.7*  HCT 32.3* 33.4* 34.2*  MCV 83.7 81.5 82.8  PLT 285 384 409*   Cardiac  Enzymes: No results for input(s): CKTOTAL, CKMB, CKMBINDEX, TROPONINI in the last 168 hours. BNP: BNP (last 3 results) No results for input(s): BNP in the last 8760 hours.  ProBNP (last 3 results) No results for input(s): PROBNP in the last 8760 hours.  CBG: Recent Labs  Lab 11/02/18 0738 11/02/18 1201 11/02/18 1619 11/02/18 2137 11/03/18 0808  GLUCAP 225* 205* 96 143* 172*       Signed:  Kayleen Memos, MD [facility] 11/03/2018, 10:30 AM

## 2018-11-03 NOTE — Discharge Instructions (Signed)
Cellulitis, Adult ° °Cellulitis is a skin infection. The infected area is often warm, red, swollen, and sore. It occurs most often in the arms and lower legs. It is very important to get treated for this condition. °What are the causes? °This condition is caused by bacteria. The bacteria enter through a break in the skin, such as a cut, burn, insect bite, open sore, or crack. °What increases the risk? °This condition is more likely to occur in people who: °· Have a weak body defense system (immune system). °· Have open cuts, burns, bites, or scrapes on the skin. °· Are older than 54 years of age. °· Have a blood sugar problem (diabetes). °· Have a long-lasting (chronic) liver disease (cirrhosis) or kidney disease. °· Are very overweight (obese). °· Have a skin problem, such as: °? Itchy rash (eczema). °? Slow movement of blood in the veins (venous stasis). °? Fluid buildup below the skin (edema). °· Have been treated with high-energy rays (radiation). °· Use IV drugs. °What are the signs or symptoms? °Symptoms of this condition include: °· Skin that is: °? Red. °? Streaking. °? Spotting. °? Swollen. °? Sore or painful when you touch it. °? Warm. °· A fever. °· Chills. °· Blisters. °How is this diagnosed? °This condition is diagnosed based on: °· Medical history. °· Physical exam. °· Blood tests. °· Imaging tests. °How is this treated? °Treatment for this condition may include: °· Medicines to treat infections or allergies. °· Home care, such as: °? Rest. °? Placing cold or warm cloths (compresses) on the skin. °· Hospital care, if the condition is very bad. °Follow these instructions at home: °Medicines °· Take over-the-counter and prescription medicines only as told by your doctor. °· If you were prescribed an antibiotic medicine, take it as told by your doctor. Do not stop taking it even if you start to feel better. °General instructions ° °· Drink enough fluid to keep your pee (urine) pale yellow. °· Do not touch  or rub the infected area. °· Raise (elevate) the infected area above the level of your heart while you are sitting or lying down. °· Place cold or warm cloths on the area as told by your doctor. °· Keep all follow-up visits as told by your doctor. This is important. °Contact a doctor if: °· You have a fever. °· You do not start to get better after 1-2 days of treatment. °· Your bone or joint under the infected area starts to hurt after the skin has healed. °· Your infection comes back. This can happen in the same area or another area. °· You have a swollen bump in the area. °· You have new symptoms. °· You feel ill and have muscle aches and pains. °Get help right away if: °· Your symptoms get worse. °· You feel very sleepy. °· You throw up (vomit) or have watery poop (diarrhea) for a long time. °· You see red streaks coming from the area. °· Your red area gets larger. °· Your red area turns dark in color. °These symptoms may represent a serious problem that is an emergency. Do not wait to see if the symptoms will go away. Get medical help right away. Call your local emergency services (911 in the [place] Do not drive yourself to the hospital. °Summary °· Cellulitis is a skin infection. The area is often warm, red, swollen, and sore. °· This condition is treated with medicines, rest, and cold and warm cloths. °· Take all medicines only   as told by your doctor. °· Tell your doctor if symptoms do not start to get better after 1-2 days of treatment. °This information is not intended to replace advice given to you by your health care provider. Make sure you discuss any questions you have with your health care provider. °Document Released: 09/11/2007 Document Revised: 08/14/2017 Document Reviewed: 08/14/2017 °Elsevier Patient Education © 2020 [employer] ° °

## 2018-11-06 ENCOUNTER — Telehealth: Payer: Self-pay | Admitting: Family

## 2018-11-06 ENCOUNTER — Other Ambulatory Visit: Payer: Self-pay

## 2018-11-06 ENCOUNTER — Encounter: Payer: Self-pay | Admitting: Family

## 2018-11-06 ENCOUNTER — Ambulatory Visit (INDEPENDENT_AMBULATORY_CARE_PROVIDER_SITE_OTHER): Payer: Medicare Other | Admitting: Family

## 2018-11-06 VITALS — Ht 69.0 in | Wt 246.0 lb

## 2018-11-06 DIAGNOSIS — T8743 Infection of amputation stump, right lower extremity: Secondary | ICD-10-CM | POA: Diagnosis not present

## 2018-11-06 DIAGNOSIS — N179 Acute kidney failure, unspecified: Secondary | ICD-10-CM | POA: Diagnosis not present

## 2018-11-06 DIAGNOSIS — E1121 Type 2 diabetes mellitus with diabetic nephropathy: Secondary | ICD-10-CM | POA: Diagnosis not present

## 2018-11-06 DIAGNOSIS — G8929 Other chronic pain: Secondary | ICD-10-CM | POA: Diagnosis not present

## 2018-11-06 DIAGNOSIS — I1310 Hypertensive heart and chronic kidney disease without heart failure, with stage 1 through stage 4 chronic kidney disease, or unspecified chronic kidney disease: Secondary | ICD-10-CM | POA: Diagnosis not present

## 2018-11-06 DIAGNOSIS — Z89511 Acquired absence of right leg below knee: Secondary | ICD-10-CM | POA: Diagnosis not present

## 2018-11-06 DIAGNOSIS — G5710 Meralgia paresthetica, unspecified lower limb: Secondary | ICD-10-CM | POA: Diagnosis not present

## 2018-11-06 DIAGNOSIS — N529 Male erectile dysfunction, unspecified: Secondary | ICD-10-CM | POA: Diagnosis not present

## 2018-11-06 DIAGNOSIS — I214 Non-ST elevation (NSTEMI) myocardial infarction: Secondary | ICD-10-CM | POA: Diagnosis not present

## 2018-11-06 DIAGNOSIS — I87302 Chronic venous hypertension (idiopathic) without complications of left lower extremity: Secondary | ICD-10-CM | POA: Diagnosis not present

## 2018-11-06 DIAGNOSIS — M1711 Unilateral primary osteoarthritis, right knee: Secondary | ICD-10-CM | POA: Diagnosis not present

## 2018-11-06 DIAGNOSIS — Z21 Asymptomatic human immunodeficiency virus [HIV] infection status: Secondary | ICD-10-CM | POA: Diagnosis not present

## 2018-11-06 DIAGNOSIS — K009 Disorder of tooth development, unspecified: Secondary | ICD-10-CM | POA: Diagnosis not present

## 2018-11-06 DIAGNOSIS — L97211 Non-pressure chronic ulcer of right calf limited to breakdown of skin: Secondary | ICD-10-CM | POA: Diagnosis not present

## 2018-11-06 DIAGNOSIS — I872 Venous insufficiency (chronic) (peripheral): Secondary | ICD-10-CM | POA: Diagnosis not present

## 2018-11-06 DIAGNOSIS — E1169 Type 2 diabetes mellitus with other specified complication: Secondary | ICD-10-CM | POA: Diagnosis not present

## 2018-11-06 DIAGNOSIS — L03115 Cellulitis of right lower limb: Secondary | ICD-10-CM | POA: Diagnosis not present

## 2018-11-06 DIAGNOSIS — E1151 Type 2 diabetes mellitus with diabetic peripheral angiopathy without gangrene: Secondary | ICD-10-CM | POA: Diagnosis not present

## 2018-11-06 DIAGNOSIS — B351 Tinea unguium: Secondary | ICD-10-CM | POA: Diagnosis not present

## 2018-11-06 DIAGNOSIS — N183 Chronic kidney disease, stage 3 (moderate): Secondary | ICD-10-CM | POA: Diagnosis not present

## 2018-11-06 DIAGNOSIS — L02411 Cutaneous abscess of right axilla: Secondary | ICD-10-CM | POA: Diagnosis not present

## 2018-11-06 DIAGNOSIS — E1142 Type 2 diabetes mellitus with diabetic polyneuropathy: Secondary | ICD-10-CM | POA: Diagnosis not present

## 2018-11-06 DIAGNOSIS — T8789 Other complications of amputation stump: Secondary | ICD-10-CM | POA: Diagnosis not present

## 2018-11-06 DIAGNOSIS — E1165 Type 2 diabetes mellitus with hyperglycemia: Secondary | ICD-10-CM | POA: Diagnosis not present

## 2018-11-06 DIAGNOSIS — D631 Anemia in chronic kidney disease: Secondary | ICD-10-CM | POA: Diagnosis not present

## 2018-11-06 DIAGNOSIS — M545 Low back pain: Secondary | ICD-10-CM | POA: Diagnosis not present

## 2018-11-06 MED ORDER — SILVER SULFADIAZINE 1 % EX CREA: 1.0000 "application " | TOPICAL_CREAM | Freq: Every day | CUTANEOUS | 1 refills | Status: DC

## 2018-11-06 NOTE — Progress Notes (Signed)
Office Visit Note   Patient: Keith [name]           Date of Birth: 1965-07-10           MRN: [id number] Visit Date: 11/06/2018              Requested by: No referring provider defined for this encounter. PCP: System, Pcp Not In  No chief complaint on file.     HPI: The patient is a 53 year old gentleman seen today for evaluation of new ulceration to his residual limb.  He is status post right below the knee amputation.  He reports that his previous ulcers had healed unfortunately he rubbed open Reports that he is currently working with biotech to get a new prosthetic as his current socket no longer fits due to volume loss  Has been doing Silvadene dressing changes does have home health assistance they have used honey dressings in the past reports these have worked well.    Assessment & Plan: Visit Diagnoses:  No diagnosis found.  Plan: We will provide order to home health they may continue using honey dressing changes twice weekly we will apply a Promogran dressing today in the office to leave this for today.  Minimize weightbearing in his prosthetic until this has healed.    Follow-up in 2 weeks.  Follow-Up Instructions: No follow-ups on file.   Ortho Exam  Patient is alert, oriented, no adenopathy, well-dressed, normal affect, normal respiratory effort.  On examination of the right residual limb this is well-healed and well consolidated however he does have an ulcer just lateral to his tibia this is 2 cm x 5 mm and 3 mm deep this is full of granulation tissue bleeding.  However he does have to the wound edges some mild maceration.  There is no odor no active drainage no sign of infection.   Imaging: No results found.    Labs: Lab Results  Component Value Date   HGBA1C >15.5 (H) 09/07/2018   HGBA1C 15.0 (H) 08/07/2018   HGBA1C 7.8 (H) 06/06/2018   ESRSEDRATE 130 (H) 11/02/2018   ESRSEDRATE 140 (H) 10/30/2018   ESRSEDRATE 74 (H) 06/06/2018   CRP <0.8 11/02/2018    CRP 2.0 (H) 10/30/2018   CRP <0.8 06/06/2018   LABURIC 6.3 08/07/2009   REPTSTATUS PENDING 11/03/2018   REPTSTATUS PENDING 11/03/2018   GRAMSTAIN  04/29/2017    NO WBC SEEN RARE GRAM POSITIVE COCCI IN PAIRS IN SINGLES    CULT  11/03/2018    NO GROWTH 2 DAYS Performed at [facility], [place] [address] [place], [place] [address]    CULT  11/03/2018    NO GROWTH 2 DAYS Performed at [facility] [address], [place], [place] [address]    LABORGA METHICILLIN RESISTANT STAPHYLOCOCCUS AUREUS 04/29/2017     Lab Results  Component Value Date   ALBUMIN 2.8 (L) 10/25/2018   ALBUMIN 2.9 (L) 10/11/2018   ALBUMIN 3.8 09/07/2018   PREALBUMIN 25.5 06/06/2018   PREALBUMIN 7.4 (L) 03/15/2017   LABURIC 6.3 08/07/2009    There is no height or weight on file to calculate BMI.  Orders:  No orders of the defined types were placed in this encounter.  No orders of the defined types were placed in this encounter.    Procedures: No procedures performed  Clinical Data: No additional findings.  ROS:  All other systems negative, except as noted in the HPI. Review of Systems  Constitutional: Negative for chills  and fever.  Skin: Positive for wound. Negative for color change.    Objective: Vital Signs: There were no vitals taken for this visit.  Specialty Comments:  No specialty comments available.  PMFS History: Patient Active Problem List   Diagnosis Date Noted  . Amputation stump infection ([place]) 10/28/2018  . Type II diabetes mellitus with renal manifestations ([place]) 08/07/2018  . Uncontrolled type 2 diabetes mellitus with hyperosmolar nonketotic hyperglycemia ([place]) 08/07/2018  . Non-pressure chronic ulcer of right calf limited to breakdown of skin ([place]) 07/06/2018  . Type 2 diabetes mellitus without complication, without long-term current use of insulin ([place]) 06/15/2018  . Chronic low back pain without sciatica 06/15/2018  . Wound of left leg 06/15/2018  .  Idiopathic chronic venous hypertension of left lower extremity with ulcer and inflammation ([place])   . Venous stasis ulcer of left calf limited to breakdown of skin without varicose veins ([place]) 04/23/2018  . Acquired absence of right leg below knee ([place]) 06/13/2017  . Acute renal failure superimposed on stage 3 chronic kidney disease ([place]) 03/15/2017  . Diabetic polyneuropathy associated with type 2 diabetes mellitus ([place]) 01/23/2017  . AKI (acute kidney injury) ([place]) 09/28/2016  . Nausea vomiting and diarrhea 09/28/2016  . Onychomycosis of multiple toenails with type 2 diabetes mellitus ([place]) 08/29/2015  . MRSA carrier 04/20/2014  . Penile wart 02/22/2014  . HIV disease ([place])   . Insulin-requiring or dependent type II diabetes mellitus ([place]) 02/04/2014  . Dental anomaly 11/20/2012  . Arthritis of right knee 02/23/2012  . Hyperlipidemia 11/10/2011  . Chronic pain 08/07/2011  . Meralgia paraesthetica 04/23/2011  . ERECTILE DYSFUNCTION 08/22/2008  . Essential hypertension 05/19/2008   Past Medical History:  Diagnosis Date  . Abscess of right foot   . AIDS ([place])   . Chronic knee pain    right  . Chronic pain   . Diabetes mellitus without complication ([place])   . Diabetes type 2, uncontrolled ([place])    HgA1c 17.6 (04/27/2010)  . Diabetic foot ulcer ([place]) 01/2017   right foot  . Erectile dysfunction   . Genital warts   . HIV (human immunodeficiency virus infection) ([place]) 2009   CD4 count 100, VL 13800 (05/01/2010)  . Hypertension   . Osteomyelitis ([place])    h/o hand  . Osteomyelitis of metatarsal ([place]) 04/28/2017    Family History  Problem Relation Age of Onset  . Hypertension Mother   . Arthritis Father   . Hypertension Father   . Hypertension Brother   . Cancer Maternal Grandmother 51       unknown type of cancer  . Depression Paternal Grandmother     Past Surgical History:  Procedure Laterality Date  . AMPUTATION Right 05/02/2017   Procedure: AMPUTATION TRANSMETARSAL;   Surgeon: [name], MD;  Location: [facility];  Service: Orthopedics;  Laterality: Right;  . AMPUTATION Right 06/13/2017   Procedure: RIGHT BELOW KNEE AMPUTATION;  Surgeon: [name], MD;  Location: [facility];  Service: Orthopedics;  Laterality: Right;  . BELOW KNEE LEG AMPUTATION Right 06/13/2017  . BELOW KNEE LEG AMPUTATION    . HERNIA REPAIR    . I&D EXTREMITY Left 08/21/2014   Procedure: INCISION AND DRAINAGE LEFT SMALL FINGER;  Surgeon: Leanora Cover, MD;  Location: [facility];  Service: Orthopedics;  Laterality: Left;  . I&D EXTREMITY Right 03/18/2017   Procedure: IRRIGATION AND DEBRIDEMENT EXTREMITY;  Surgeon: [name], MD;  Location: [facility];  Service: Orthopedics;  Laterality: Right;  .  MINOR IRRIGATION AND DEBRIDEMENT OF WOUND Right 04/22/2014   Procedure: IRRIGATION AND DEBRIDEMENT OF RIGHT NECK ABCESS;  Surgeon: Jerrell Belfast, MD;  Location: [facility];  Service: ENT;  Laterality: Right;  . MULTIPLE EXTRACTIONS WITH ALVEOLOPLASTY N/A 01/18/2013   Procedure: MULTIPLE EXTRACION 3, 6, 7, 10, 11, 13, 21, 22, 27, 28, 29, 30 WITH ALVEOLOPLASTY;  Surgeon: Gae Bon, DDS;  Location: [facility];  Service: Oral Surgery;  Laterality: N/A;  . SKIN SPLIT GRAFT Right 03/21/2017   Procedure: IRRIGATION AND DEBRIDEMENT RIGHT FOOT AND APPLY SPLIT THICKNESS SKIN GRAFT AND WOUND VAC;  Surgeon: [name], MD;  Location: [facility];  Service: Orthopedics;  Laterality: Right;  . TEE WITHOUT CARDIOVERSION N/A 05/02/2017   Procedure: TRANSESOPHAGEAL ECHOCARDIOGRAM (TEE);  Surgeon: Acie Fredrickson Wonda Cheng, MD;  Location: [facility];  Service: Cardiovascular;  Laterality: N/A;  coincidental to orthopedic case   Social History   Occupational History  . Occupation: disabled  Tobacco Use  . Smoking status: Never Smoker  . Smokeless tobacco: Never Used  Substance and Sexual Activity  . Alcohol use: Never    Frequency: Never  . Drug use: Never    Comment: OTC ASA  . Sexual activity: Yes    Partners: Female    Comment: given  condoms

## 2018-11-06 NOTE — Telephone Encounter (Signed)
Sent silvadene into pharmacy per shraddha

## 2018-11-06 NOTE — Telephone Encounter (Signed)
Silvadene dressings are fine

## 2018-11-06 NOTE — Telephone Encounter (Signed)
Agapito Games from [facility] is seeing the patient for an evaluation and she is wanting to know what type of dressing does the provider want her to use?  CB#[phone].  Thank you.

## 2018-11-06 NOTE — Telephone Encounter (Signed)
Do you want them to use the promagran we applied today.

## 2018-11-08 DIAGNOSIS — T8743 Infection of amputation stump, right lower extremity: Secondary | ICD-10-CM | POA: Diagnosis not present

## 2018-11-08 DIAGNOSIS — L02411 Cutaneous abscess of right axilla: Secondary | ICD-10-CM | POA: Diagnosis not present

## 2018-11-08 DIAGNOSIS — I1310 Hypertensive heart and chronic kidney disease without heart failure, with stage 1 through stage 4 chronic kidney disease, or unspecified chronic kidney disease: Secondary | ICD-10-CM | POA: Diagnosis not present

## 2018-11-08 DIAGNOSIS — I214 Non-ST elevation (NSTEMI) myocardial infarction: Secondary | ICD-10-CM | POA: Diagnosis not present

## 2018-11-08 DIAGNOSIS — L03115 Cellulitis of right lower limb: Secondary | ICD-10-CM | POA: Diagnosis not present

## 2018-11-08 DIAGNOSIS — T8789 Other complications of amputation stump: Secondary | ICD-10-CM | POA: Diagnosis not present

## 2018-11-08 LAB — CULTURE, BLOOD (ROUTINE X 2)
Culture: NO GROWTH
Culture: NO GROWTH
Special Requests: ADEQUATE

## 2018-11-10 ENCOUNTER — Encounter (HOSPITAL_COMMUNITY): Payer: Self-pay | Admitting: Emergency Medicine

## 2018-11-10 ENCOUNTER — Emergency Department (HOSPITAL_COMMUNITY): Payer: Medicare Other

## 2018-11-10 ENCOUNTER — Encounter (HOSPITAL_COMMUNITY)
Admission: EM | Disposition: A | Discharge status: Home Health | Payer: Self-pay | Source: Home / Self Care | Attending: Interventional Cardiology

## 2018-11-10 ENCOUNTER — Inpatient Hospital Stay (HOSPITAL_COMMUNITY)
Admission: EM | Admit: 2018-11-10 | Discharge: 2018-11-13 | DRG: 247 | Disposition: A | Discharge status: Home Health | Payer: Medicare Other | Attending: Interventional Cardiology | Admitting: Interventional Cardiology

## 2018-11-10 ENCOUNTER — Other Ambulatory Visit: Payer: Self-pay

## 2018-11-10 DIAGNOSIS — I129 Hypertensive chronic kidney disease with stage 1 through stage 4 chronic kidney disease, or unspecified chronic kidney disease: Secondary | ICD-10-CM | POA: Diagnosis not present

## 2018-11-10 DIAGNOSIS — R7989 Other specified abnormal findings of blood chemistry: Secondary | ICD-10-CM

## 2018-11-10 DIAGNOSIS — L02415 Cutaneous abscess of right lower limb: Secondary | ICD-10-CM | POA: Diagnosis present

## 2018-11-10 DIAGNOSIS — Z882 Allergy status to sulfonamides status: Secondary | ICD-10-CM

## 2018-11-10 DIAGNOSIS — Z22322 Carrier or suspected carrier of Methicillin resistant Staphylococcus aureus: Secondary | ICD-10-CM | POA: Diagnosis not present

## 2018-11-10 DIAGNOSIS — R079 Chest pain, unspecified: Secondary | ICD-10-CM | POA: Diagnosis not present

## 2018-11-10 DIAGNOSIS — Z89511 Acquired absence of right leg below knee: Secondary | ICD-10-CM

## 2018-11-10 DIAGNOSIS — Z79899 Other long term (current) drug therapy: Secondary | ICD-10-CM

## 2018-11-10 DIAGNOSIS — Z8249 Family history of ischemic heart disease and other diseases of the circulatory system: Secondary | ICD-10-CM | POA: Diagnosis not present

## 2018-11-10 DIAGNOSIS — I219 Acute myocardial infarction, unspecified: Secondary | ICD-10-CM

## 2018-11-10 DIAGNOSIS — E1169 Type 2 diabetes mellitus with other specified complication: Secondary | ICD-10-CM

## 2018-11-10 DIAGNOSIS — N183 Chronic kidney disease, stage 3 (moderate): Secondary | ICD-10-CM

## 2018-11-10 DIAGNOSIS — E1122 Type 2 diabetes mellitus with diabetic chronic kidney disease: Secondary | ICD-10-CM | POA: Diagnosis present

## 2018-11-10 DIAGNOSIS — I249 Acute ischemic heart disease, unspecified: Secondary | ICD-10-CM | POA: Diagnosis not present

## 2018-11-10 DIAGNOSIS — R110 Nausea: Secondary | ICD-10-CM | POA: Diagnosis not present

## 2018-11-10 DIAGNOSIS — E782 Mixed hyperlipidemia: Secondary | ICD-10-CM

## 2018-11-10 DIAGNOSIS — I213 ST elevation (STEMI) myocardial infarction of unspecified site: Secondary | ICD-10-CM

## 2018-11-10 DIAGNOSIS — B20 Human immunodeficiency virus [HIV] disease: Secondary | ICD-10-CM | POA: Diagnosis present

## 2018-11-10 DIAGNOSIS — Y835 Amputation of limb(s) as the cause of abnormal reaction of the patient, or of later complication, without mention of misadventure at the time of the procedure: Secondary | ICD-10-CM | POA: Diagnosis present

## 2018-11-10 DIAGNOSIS — T8740 Infection of amputation stump, unspecified extremity: Secondary | ICD-10-CM | POA: Diagnosis not present

## 2018-11-10 DIAGNOSIS — I21A9 Other myocardial infarction type: Secondary | ICD-10-CM

## 2018-11-10 DIAGNOSIS — Z794 Long term (current) use of insulin: Secondary | ICD-10-CM

## 2018-11-10 DIAGNOSIS — R0789 Other chest pain: Secondary | ICD-10-CM | POA: Diagnosis not present

## 2018-11-10 DIAGNOSIS — E1129 Type 2 diabetes mellitus with other diabetic kidney complication: Secondary | ICD-10-CM | POA: Diagnosis present

## 2018-11-10 DIAGNOSIS — T8743 Infection of amputation stump, right lower extremity: Secondary | ICD-10-CM | POA: Diagnosis not present

## 2018-11-10 DIAGNOSIS — I214 Non-ST elevation (NSTEMI) myocardial infarction: Principal | ICD-10-CM

## 2018-11-10 DIAGNOSIS — R7309 Other abnormal glucose: Secondary | ICD-10-CM

## 2018-11-10 DIAGNOSIS — D649 Anemia, unspecified: Secondary | ICD-10-CM | POA: Diagnosis present

## 2018-11-10 DIAGNOSIS — E785 Hyperlipidemia, unspecified: Secondary | ICD-10-CM | POA: Diagnosis present

## 2018-11-10 DIAGNOSIS — I2510 Atherosclerotic heart disease of native coronary artery without angina pectoris: Secondary | ICD-10-CM | POA: Diagnosis not present

## 2018-11-10 DIAGNOSIS — N184 Chronic kidney disease, stage 4 (severe): Secondary | ICD-10-CM

## 2018-11-10 DIAGNOSIS — E118 Type 2 diabetes mellitus with unspecified complications: Secondary | ICD-10-CM | POA: Diagnosis not present

## 2018-11-10 DIAGNOSIS — N182 Chronic kidney disease, stage 2 (mild): Secondary | ICD-10-CM | POA: Diagnosis present

## 2018-11-10 DIAGNOSIS — E1165 Type 2 diabetes mellitus with hyperglycemia: Secondary | ICD-10-CM

## 2018-11-10 DIAGNOSIS — I10 Essential (primary) hypertension: Secondary | ICD-10-CM | POA: Diagnosis not present

## 2018-11-10 DIAGNOSIS — Z20828 Contact with and (suspected) exposure to other viral communicable diseases: Secondary | ICD-10-CM | POA: Diagnosis not present

## 2018-11-10 DIAGNOSIS — R778 Other specified abnormalities of plasma proteins: Secondary | ICD-10-CM

## 2018-11-10 DIAGNOSIS — L8990 Pressure ulcer of unspecified site, unspecified stage: Secondary | ICD-10-CM | POA: Insufficient documentation

## 2018-11-10 HISTORY — PX: LEFT HEART CATH AND CORONARY ANGIOGRAPHY: CATH118249

## 2018-11-10 HISTORY — DX: ST elevation (STEMI) myocardial infarction of unspecified site: I21.3

## 2018-11-10 HISTORY — DX: Anemia, unspecified: D64.9

## 2018-11-10 HISTORY — PX: CORONARY STENT INTERVENTION: CATH118234

## 2018-11-10 LAB — CREATININE, SERUM
Creatinine, Ser: 1.21 mg/dL (ref 0.61–1.24)
GFR calc Af Amer: >60 mL/min (ref >60)
GFR calc non Af Amer: >60 mL/min (ref >60)

## 2018-11-10 LAB — HEPATIC FUNCTION PANEL
ALT: 32 U/L (ref 0–44)
AST: 135 U/L — ABNORMAL HIGH (ref 15–41)
Albumin: 2.8 g/dL — ABNORMAL LOW (ref 3.5–5.0)
Alkaline Phosphatase: 79 U/L (ref 38–126)
Bilirubin, Direct: <0.1 mg/dL (ref 0.0–0.2)
Total Bilirubin: 0.7 mg/dL (ref 0.3–1.2)
Total Protein: 7.7 g/dL (ref 6.5–8.1)

## 2018-11-10 LAB — CBC
HCT: 34.8 % — ABNORMAL LOW (ref 39.0–52.0)
HCT: 30.6 % — ABNORMAL LOW (ref 39.0–52.0)
Hemoglobin: 10.6 g/dL — ABNORMAL LOW (ref 13.0–17.0)
Hemoglobin: 9.6 g/dL — ABNORMAL LOW (ref 13.0–17.0)
MCH: 25.6 pg — ABNORMAL LOW (ref 26.0–34.0)
MCH: 25.7 pg — ABNORMAL LOW (ref 26.0–34.0)
MCHC: 30.5 g/dL (ref 30.0–36.0)
MCHC: 31.4 g/dL (ref 30.0–36.0)
MCV: 84.1 fL (ref 80.0–100.0)
MCV: 82 fL (ref 80.0–100.0)
Platelets: 422 10*3/uL — ABNORMAL HIGH (ref 150–400)
Platelets: 380 10*3/uL (ref 150–400)
RBC: 4.14 MIL/uL — ABNORMAL LOW (ref 4.22–5.81)
RBC: 3.73 MIL/uL — ABNORMAL LOW (ref 4.22–5.81)
RDW: 13.8 % (ref 11.5–15.5)
RDW: 13.7 % (ref 11.5–15.5)
WBC: 7.6 10*3/uL (ref 4.0–10.5)
WBC: 8.4 10*3/uL (ref 4.0–10.5)
nRBC: 0 % (ref 0.0–0.2)
nRBC: 0 % (ref 0.0–0.2)

## 2018-11-10 LAB — GLUCOSE, CAPILLARY: Glucose-Capillary: 182 mg/dL — ABNORMAL HIGH (ref 70–99)

## 2018-11-10 LAB — POCT ACTIVATED CLOTTING TIME
Activated Clotting Time: 202 seconds
Activated Clotting Time: 208 seconds
Activated Clotting Time: 230 seconds
Activated Clotting Time: 263 seconds

## 2018-11-10 LAB — BASIC METABOLIC PANEL
Anion gap: 10 (ref 5–15)
BUN: 23 mg/dL — ABNORMAL HIGH (ref 6–20)
CO2: 26 mmol/L (ref 22–32)
Calcium: 9.6 mg/dL (ref 8.9–10.3)
Chloride: 101 mmol/L (ref 98–111)
Creatinine, Ser: 1.31 mg/dL — ABNORMAL HIGH (ref 0.61–1.24)
GFR calc Af Amer: >60 mL/min (ref >60)
GFR calc non Af Amer: >60 mL/min (ref >60)
Glucose, Bld: 223 mg/dL — ABNORMAL HIGH (ref 70–99)
Potassium: 3.9 mmol/L (ref 3.5–5.1)
Sodium: 137 mmol/L (ref 135–145)

## 2018-11-10 LAB — SARS CORONAVIRUS 2 BY RT PCR (HOSPITAL ORDER, PERFORMED IN ~~LOC~~ HOSPITAL LAB): SARS Coronavirus 2: NEGATIVE

## 2018-11-10 LAB — TSH: TSH: 1.633 u[IU]/mL (ref 0.350–4.500)

## 2018-11-10 LAB — TROPONIN I (HIGH SENSITIVITY)
Troponin I (High Sensitivity): >27000 ng/L (ref <18)
Troponin I (High Sensitivity): >27000 ng/L (ref <18)
Troponin I (High Sensitivity): >27000 ng/L

## 2018-11-10 SURGERY — LEFT HEART CATH AND CORONARY ANGIOGRAPHY
Anesthesia: LOCAL

## 2018-11-10 MED ORDER — HYDRALAZINE HCL 20 MG/ML IJ SOLN: 10.0000 mg | INTRAMUSCULAR | Status: AC | PRN

## 2018-11-10 MED ORDER — SODIUM CHLORIDE 0.9% FLUSH: 3.0000 mL | INTRAVENOUS | Status: DC | PRN

## 2018-11-10 MED ORDER — FENTANYL CITRATE (PF) 100 MCG/2ML IJ SOLN
INTRAMUSCULAR | Status: AC
  Filled 2018-11-10: qty 2

## 2018-11-10 MED ORDER — SODIUM CHLORIDE 0.9% FLUSH
3.0000 mL | Freq: Two times a day (BID) | INTRAVENOUS | Status: DC
  Administered 2018-11-10 – 2018-11-12 (×4): 3 mL via INTRAVENOUS

## 2018-11-10 MED ORDER — ACETAMINOPHEN 325 MG PO TABS
650.0000 mg | ORAL_TABLET | ORAL | Status: DC | PRN
  Administered 2018-11-11: 650 mg via ORAL
  Filled 2018-11-10: qty 2

## 2018-11-10 MED ORDER — HYDRALAZINE HCL 10 MG PO TABS: 10.0000 mg | ORAL_TABLET | Freq: Three times a day (TID) | ORAL | Status: DC

## 2018-11-10 MED ORDER — ASPIRIN 81 MG PO CHEW
324.0000 mg | CHEWABLE_TABLET | Freq: Once | ORAL | Status: AC
  Administered 2018-11-10: 324 mg via ORAL
  Filled 2018-11-10: qty 4

## 2018-11-10 MED ORDER — DAPSONE 100 MG PO TABS
100.0000 mg | ORAL_TABLET | Freq: Every day | ORAL | Status: DC
  Administered 2018-11-11 – 2018-11-12 (×2): 100 mg via ORAL
  Filled 2018-11-10 (×3): qty 1

## 2018-11-10 MED ORDER — INSULIN ASPART 100 UNIT/ML ~~LOC~~ SOLN
0.0000 [IU] | Freq: Three times a day (TID) | SUBCUTANEOUS | Status: DC
  Administered 2018-11-11: 2 [IU] via SUBCUTANEOUS
  Administered 2018-11-11 – 2018-11-12 (×2): 1 [IU] via SUBCUTANEOUS
  Administered 2018-11-12: 2 [IU] via SUBCUTANEOUS

## 2018-11-10 MED ORDER — VERAPAMIL HCL 2.5 MG/ML IV SOLN
INTRAVENOUS | Status: DC | PRN
  Administered 2018-11-10: 10 mL via INTRA_ARTERIAL

## 2018-11-10 MED ORDER — AMLODIPINE BESYLATE 10 MG PO TABS
10.0000 mg | ORAL_TABLET | Freq: Every day | ORAL | Status: DC
  Administered 2018-11-11 – 2018-11-13 (×3): 10 mg via ORAL
  Filled 2018-11-10: qty 1
  Filled 2018-11-10 (×2): qty 2

## 2018-11-10 MED ORDER — HEPARIN (PORCINE) 25000 UT/250ML-% IV SOLN: 1200.0000 [IU]/h | INTRAVENOUS | Status: DC

## 2018-11-10 MED ORDER — HEPARIN (PORCINE) IN NACL 1000-0.9 UT/500ML-% IV SOLN
INTRAVENOUS | Status: AC
  Filled 2018-11-10: qty 1000

## 2018-11-10 MED ORDER — LIDOCAINE HCL (PF) 1 % IJ SOLN
INTRAMUSCULAR | Status: AC
  Filled 2018-11-10: qty 30

## 2018-11-10 MED ORDER — NITROGLYCERIN 1 MG/10 ML FOR IR/CATH LAB
INTRA_ARTERIAL | Status: DC | PRN
  Administered 2018-11-10 (×2): 200 ug via INTRACORONARY

## 2018-11-10 MED ORDER — AMOXICILLIN-POT CLAVULANATE 875-125 MG PO TABS
1.0000 | ORAL_TABLET | Freq: Two times a day (BID) | ORAL | Status: AC
  Administered 2018-11-10 – 2018-11-12 (×4): 1 via ORAL
  Filled 2018-11-10 (×6): qty 1

## 2018-11-10 MED ORDER — HEPARIN SODIUM (PORCINE) 5000 UNIT/ML IJ SOLN
5000.0000 [IU] | Freq: Three times a day (TID) | INTRAMUSCULAR | Status: DC
  Administered 2018-11-10 – 2018-11-13 (×8): 5000 [IU] via SUBCUTANEOUS
  Filled 2018-11-10 (×8): qty 1

## 2018-11-10 MED ORDER — OXYBUTYNIN CHLORIDE 5 MG PO TABS
5.0000 mg | ORAL_TABLET | Freq: Two times a day (BID) | ORAL | Status: DC
  Administered 2018-11-10 – 2018-11-13 (×5): 5 mg via ORAL
  Filled 2018-11-10 (×7): qty 1

## 2018-11-10 MED ORDER — NITROGLYCERIN 0.4 MG SL SUBL
0.4000 mg | SUBLINGUAL_TABLET | Freq: Once | SUBLINGUAL | Status: AC
  Administered 2018-11-10: 0.4 mg via SUBLINGUAL
  Filled 2018-11-10: qty 1

## 2018-11-10 MED ORDER — HEPARIN SODIUM (PORCINE) 1000 UNIT/ML IJ SOLN
INTRAMUSCULAR | Status: AC
  Filled 2018-11-10: qty 1

## 2018-11-10 MED ORDER — INSULIN GLARGINE 100 UNIT/ML ~~LOC~~ SOLN
30.0000 [IU] | Freq: Every day | SUBCUTANEOUS | Status: DC
  Administered 2018-11-11 – 2018-11-12 (×2): 30 [IU] via SUBCUTANEOUS
  Filled 2018-11-10 (×3): qty 0.3

## 2018-11-10 MED ORDER — NITROGLYCERIN 0.4 MG SL SUBL: 0.4000 mg | SUBLINGUAL_TABLET | SUBLINGUAL | Status: DC | PRN

## 2018-11-10 MED ORDER — HEPARIN BOLUS VIA INFUSION
4000.0000 [IU] | Freq: Once | INTRAVENOUS | Status: DC
  Filled 2018-11-10: qty 4000

## 2018-11-10 MED ORDER — SODIUM CHLORIDE 0.9 % IV SOLN: 250.0000 mL | INTRAVENOUS | Status: DC | PRN

## 2018-11-10 MED ORDER — TICAGRELOR 90 MG PO TABS
90.0000 mg | ORAL_TABLET | Freq: Two times a day (BID) | ORAL | Status: DC
  Administered 2018-11-10 – 2018-11-12 (×4): 90 mg via ORAL
  Filled 2018-11-10 (×5): qty 1

## 2018-11-10 MED ORDER — TICAGRELOR 90 MG PO TABS
ORAL_TABLET | ORAL | Status: AC
  Filled 2018-11-10: qty 1

## 2018-11-10 MED ORDER — ONDANSETRON HCL 4 MG/2ML IJ SOLN
4.0000 mg | Freq: Four times a day (QID) | INTRAMUSCULAR | Status: DC | PRN
  Administered 2018-11-11 (×2): 4 mg via INTRAVENOUS
  Filled 2018-11-10 (×2): qty 2

## 2018-11-10 MED ORDER — VERAPAMIL HCL 2.5 MG/ML IV SOLN
INTRAVENOUS | Status: AC
  Filled 2018-11-10: qty 2

## 2018-11-10 MED ORDER — MIDAZOLAM HCL 2 MG/2ML IJ SOLN
INTRAMUSCULAR | Status: AC
  Filled 2018-11-10: qty 2

## 2018-11-10 MED ORDER — TICAGRELOR 90 MG PO TABS
ORAL_TABLET | ORAL | Status: DC | PRN
  Administered 2018-11-10: 180 mg via ORAL

## 2018-11-10 MED ORDER — OXYCODONE HCL 5 MG PO TABS
10.0000 mg | ORAL_TABLET | Freq: Every day | ORAL | Status: DC | PRN
  Administered 2018-11-12: 10 mg via ORAL
  Filled 2018-11-10 (×2): qty 2

## 2018-11-10 MED ORDER — FENTANYL CITRATE (PF) 100 MCG/2ML IJ SOLN
INTRAMUSCULAR | Status: DC | PRN
  Administered 2018-11-10: 50 ug via INTRAVENOUS

## 2018-11-10 MED ORDER — BICTEGRAVIR-EMTRICITAB-TENOFOV 50-200-25 MG PO TABS
1.0000 | ORAL_TABLET | Freq: Every day | ORAL | Status: DC
  Administered 2018-11-11 – 2018-11-12 (×2): 1 via ORAL
  Filled 2018-11-10 (×3): qty 1

## 2018-11-10 MED ORDER — SODIUM CHLORIDE 0.9 % IV SOLN
INTRAVENOUS | Status: AC | PRN
  Administered 2018-11-10: 20 mL/h via INTRAVENOUS

## 2018-11-10 MED ORDER — NITROGLYCERIN 1 MG/10 ML FOR IR/CATH LAB
INTRA_ARTERIAL | Status: AC
  Filled 2018-11-10: qty 10

## 2018-11-10 MED ORDER — CARVEDILOL 3.125 MG PO TABS
3.1250 mg | ORAL_TABLET | Freq: Two times a day (BID) | ORAL | Status: DC
  Administered 2018-11-10 – 2018-11-11 (×2): 3.125 mg via ORAL
  Filled 2018-11-10 (×2): qty 1

## 2018-11-10 MED ORDER — MIDAZOLAM HCL 2 MG/2ML IJ SOLN
INTRAMUSCULAR | Status: DC | PRN
  Administered 2018-11-10: 2 mg via INTRAVENOUS

## 2018-11-10 MED ORDER — IOHEXOL 350 MG/ML SOLN
INTRAVENOUS | Status: DC | PRN
  Administered 2018-11-10: 245 mL

## 2018-11-10 MED ORDER — HEPARIN (PORCINE) IN NACL 1000-0.9 UT/500ML-% IV SOLN
INTRAVENOUS | Status: DC | PRN
  Administered 2018-11-10 (×2): 500 mL

## 2018-11-10 MED ORDER — SODIUM CHLORIDE 0.9% FLUSH: 3.0000 mL | Freq: Two times a day (BID) | INTRAVENOUS | Status: DC

## 2018-11-10 MED ORDER — LIDOCAINE HCL (PF) 1 % IJ SOLN
INTRAMUSCULAR | Status: DC | PRN
  Administered 2018-11-10: 2 mL

## 2018-11-10 MED ORDER — LABETALOL HCL 5 MG/ML IV SOLN: 10.0000 mg | INTRAVENOUS | Status: AC | PRN

## 2018-11-10 MED ORDER — ASPIRIN EC 81 MG PO TBEC
81.0000 mg | DELAYED_RELEASE_TABLET | Freq: Every day | ORAL | Status: DC
  Administered 2018-11-11 – 2018-11-13 (×3): 81 mg via ORAL
  Filled 2018-11-10 (×3): qty 1

## 2018-11-10 MED ORDER — NITROGLYCERIN IN D5W 200-5 MCG/ML-% IV SOLN
5.0000 ug/min | INTRAVENOUS | Status: DC
  Administered 2018-11-10: 5 ug/min via INTRAVENOUS
  Administered 2018-11-11: 100 ug/min via INTRAVENOUS
  Filled 2018-11-10 (×2): qty 250

## 2018-11-10 MED ORDER — HEPARIN SODIUM (PORCINE) 1000 UNIT/ML IJ SOLN
INTRAMUSCULAR | Status: DC | PRN
  Administered 2018-11-10: 5000 [IU] via INTRAVENOUS
  Administered 2018-11-10: 10000 [IU] via INTRAVENOUS
  Administered 2018-11-10: 5000 [IU] via INTRAVENOUS

## 2018-11-10 MED ORDER — SODIUM CHLORIDE 0.9 % IV SOLN
INTRAVENOUS | Status: DC
  Administered 2018-11-10: 20:00:00 via INTRAVENOUS
  Administered 2018-11-11: 1 mL via INTRAVENOUS
  Administered 2018-11-11 – 2018-11-12 (×3): via INTRAVENOUS

## 2018-11-10 MED ORDER — SODIUM CHLORIDE 0.9% FLUSH: 3.0000 mL | Freq: Once | INTRAVENOUS | Status: DC

## 2018-11-10 MED ORDER — SODIUM CHLORIDE 0.9 % IV SOLN: INTRAVENOUS | Status: AC

## 2018-11-10 MED ORDER — ESCITALOPRAM OXALATE 10 MG PO TABS
20.0000 mg | ORAL_TABLET | Freq: Every day | ORAL | Status: DC
  Administered 2018-11-11 – 2018-11-13 (×3): 20 mg via ORAL
  Filled 2018-11-10 (×3): qty 2

## 2018-11-10 SURGICAL SUPPLY — 23 items
BAG SNAP BAND KOVER 36X36 (MISCELLANEOUS) ×1 IMPLANT
BALLN EMERGE MR 2.0X15 (BALLOONS) ×2
BALLOON EMERGE MR 2.0X15 (BALLOONS) IMPLANT
CATH 5FR JL3.5 JR4 ANG PIG MP (CATHETERS) ×1 IMPLANT
CATH INFINITI 5 FR AR1 MOD (CATHETERS) ×1 IMPLANT
CATH INFINITI 5FR AL1 (CATHETERS) ×1 IMPLANT
CATH LAUNCHER 5F RADR (CATHETERS) IMPLANT
CATH VISTA GUIDE 6FR XB3.5 (CATHETERS) ×1 IMPLANT
CATHETER LAUNCHER 5F RADR (CATHETERS) ×2
CATHETER LAUNCHER 6FR NOTO (CATHETERS) ×1 IMPLANT
COVER DOME SNAP 22 D (MISCELLANEOUS) ×1 IMPLANT
DEVICE RAD COMP TR BAND LRG (VASCULAR PRODUCTS) ×1 IMPLANT
ELECT DEFIB PAD ADLT CADENCE (PAD) ×1 IMPLANT
GLIDESHEATH SLEND SS 6F .021 (SHEATH) ×1 IMPLANT
GUIDEWIRE INQWIRE 1.5J.035X260 (WIRE) IMPLANT
INQWIRE 1.5J .035X260CM (WIRE) ×2
KIT ENCORE 26 ADVANTAGE (KITS) ×1 IMPLANT
KIT HEART LEFT (KITS) ×2 IMPLANT
PACK CARDIAC CATHETERIZATION (CUSTOM PROCEDURE TRAY) ×2 IMPLANT
STENT RESOLUTE ONYX 2.25X15 (Permanent Stent) ×1 IMPLANT
TRANSDUCER W/STOPCOCK (MISCELLANEOUS) ×2 IMPLANT
TUBING CIL FLEX 10 FLL-RA (TUBING) ×2 IMPLANT
WIRE ASAHI PROWATER 180CM (WIRE) ×1 IMPLANT

## 2018-11-10 NOTE — Interval H&P Note (Signed)
History and Physical Interval Note:  11/10/2018 6:04 PM  Keith [name]  has presented today for surgery, with the diagnosis of chest pain - elevated trop.  The various methods of treatment have been discussed with the patient and family. After consideration of risks, benefits and other options for treatment, the patient has consented to  Procedure(s): LEFT HEART CATH AND CORONARY ANGIOGRAPHY (N/A) CORONARY STENT INTERVENTION (N/A) as a surgical intervention.  The patient's history has been reviewed, patient examined, no change in status, stable for surgery.  I have reviewed the patient's chart and labs.  Questions were answered to the patient's satisfaction.     Glenetta Hew

## 2018-11-10 NOTE — ED Provider Notes (Signed)
[place] 2H CARDIOVASCULAR ICU Provider Note   CSN: [id number] Arrival date & time: 11/10/18  1235     History   Chief Complaint Chief Complaint  Patient presents with  . Chest Pain    HPI Keith [name] is a 53 y.o. male.  Presents emerged department chief complaint chest pain.  Seen with past medical history for uncontrolled diabetes, hypertension, HIV, CKD.  States symptoms started yesterday evening approximately 6:00pm.  Pain is been constant, sharp, left-sided, currently 7 out of 10 in severity.  Has been associated with some nausea, dizziness shortness of breath.  Nonradiating.  Denies prior history of coronary artery disease.     HPI  Past Medical History:  Diagnosis Date  . Abscess of right foot    abscess/ulcer of R transtibial amputation requiring IV abx  . AIDS ([place])   . Chronic anemia   . Chronic knee pain    right  . Chronic pain   . CKD (chronic kidney disease), stage II   . Diabetes type 2, uncontrolled ([place])    HgA1c 17.6 (04/27/2010)  . Diabetic foot ulcer ([place]) 01/2017   right foot  . Erectile dysfunction   . Genital warts   . HIV (human immunodeficiency virus infection) ([place]) 2009   CD4 count 100, VL 13800 (05/01/2010)  . Hypertension   . Osteomyelitis ([place])    h/o hand  . Osteomyelitis of metatarsal ([name]) 04/28/2017    Patient Active Problem List   Diagnosis Date Noted  . Acute myocardial infarction ([place]) 11/10/2018  . CKD (chronic kidney disease), stage II 11/10/2018  . Amputation stump infection ([place]) 10/28/2018  . Type II diabetes mellitus with renal manifestations ([place]) 08/07/2018  . Uncontrolled type 2 diabetes mellitus with hyperosmolar nonketotic hyperglycemia ([place]) 08/07/2018  . Non-pressure chronic ulcer of right calf limited to breakdown of skin ([place]) 07/06/2018  . Type 2 diabetes mellitus without complication, without long-term current use of insulin ([place]) 06/15/2018  . Chronic low back pain without sciatica 06/15/2018  .  Idiopathic chronic venous hypertension of left lower extremity with ulcer and inflammation ([place])   . Venous stasis ulcer of left calf limited to breakdown of skin without varicose veins ([place]) 04/23/2018  . Acquired absence of right leg below knee ([place]) 06/13/2017  . Acute renal failure superimposed on stage 3 chronic kidney disease ([place]) 03/15/2017  . Diabetic polyneuropathy associated with type 2 diabetes mellitus ([place]) 01/23/2017  . AKI (acute kidney injury) ([place]) 09/28/2016  . Nausea vomiting and diarrhea 09/28/2016  . Onychomycosis of multiple toenails with type 2 diabetes mellitus ([place]) 08/29/2015  . MRSA carrier 04/20/2014  . Penile wart 02/22/2014  . HIV disease ([place])   . Insulin-requiring or dependent type II diabetes mellitus ([place]) 02/04/2014  . Dental anomaly 11/20/2012  . Arthritis of right knee 02/23/2012  . Hyperlipidemia 11/10/2011  . Chronic pain 08/07/2011  . Meralgia paraesthetica 04/23/2011  . ERECTILE DYSFUNCTION 08/22/2008  . Essential hypertension 05/19/2008    Past Surgical History:  Procedure Laterality Date  . AMPUTATION Right 05/02/2017   Procedure: AMPUTATION TRANSMETARSAL;  Surgeon: [name], MD;  Location: [facility];  Service: Orthopedics;  Laterality: Right;  . AMPUTATION Right 06/13/2017   Procedure: RIGHT BELOW KNEE AMPUTATION;  Surgeon: [name], MD;  Location: [facility];  Service: Orthopedics;  Laterality: Right;  . BELOW KNEE LEG AMPUTATION Right 06/13/2017  . BELOW KNEE LEG AMPUTATION    . HERNIA REPAIR    . I&D EXTREMITY Left 08/21/2014  Procedure: INCISION AND DRAINAGE LEFT SMALL FINGER;  Surgeon: Leanora Cover, MD;  Location: [facility];  Service: Orthopedics;  Laterality: Left;  . I&D EXTREMITY Right 03/18/2017   Procedure: IRRIGATION AND DEBRIDEMENT EXTREMITY;  Surgeon: [name], MD;  Location: [facility];  Service: Orthopedics;  Laterality: Right;  . MINOR IRRIGATION AND DEBRIDEMENT OF WOUND Right 04/22/2014   Procedure: IRRIGATION AND DEBRIDEMENT  OF RIGHT NECK ABCESS;  Surgeon: Jerrell Belfast, MD;  Location: [facility];  Service: ENT;  Laterality: Right;  . MULTIPLE EXTRACTIONS WITH ALVEOLOPLASTY N/A 01/18/2013   Procedure: MULTIPLE EXTRACION 3, 6, 7, 10, 11, 13, 21, 22, 27, 28, 29, 30 WITH ALVEOLOPLASTY;  Surgeon: Gae Bon, DDS;  Location: [facility];  Service: Oral Surgery;  Laterality: N/A;  . SKIN SPLIT GRAFT Right 03/21/2017   Procedure: IRRIGATION AND DEBRIDEMENT RIGHT FOOT AND APPLY SPLIT THICKNESS SKIN GRAFT AND WOUND VAC;  Surgeon: [name], MD;  Location: [facility];  Service: Orthopedics;  Laterality: Right;  . TEE WITHOUT CARDIOVERSION N/A 05/02/2017   Procedure: TRANSESOPHAGEAL ECHOCARDIOGRAM (TEE);  Surgeon: Acie Fredrickson Wonda Cheng, MD;  [facility];  Service: Cardiovascular;  Laterality: N/A;  coincidental to orthopedic case        Home Medications    Prior to Admission medications   Medication Sig Start Date End Date Taking? Authorizing Provider  amLODipine (NORVASC) 10 MG tablet Take 1 tablet (10 mg total) by mouth daily. 07/09/18  Yes [name], FNP  amoxicillin-clavulanate (AUGMENTIN) 875-125 MG tablet Take 1 tablet by mouth every 12 (twelve) hours for 10 days. 11/03/18 11/13/18 Yes Hall, Carole N, DO  bictegravir-emtricitabine-tenofovir AF (BIKTARVY) 50-200-25 MG TABS tablet Take 1 tablet by mouth daily. 10/15/18  Yes [name], MD  blood glucose meter kit and supplies Dispense based on patient and insurance preference. Use up to four times daily as directed. (FOR ICD-10 E10.9, E11.9). 07/31/18  Yes [name], FNP  dapsone 100 MG tablet Take 1 tablet (100 mg total) by mouth daily. 11/03/18  Yes Hall, Carole N, DO  EASY COMFORT PEN NEEDLES 31G X 5 MM MISC INJECT 20 UNITS DAILY AS DIRECTED. Patient taking differently: 20 Units See admin instructions. As directed 10/19/18  Yes [name], FNP  escitalopram (LEXAPRO) 20 MG tablet Take 20 mg by mouth daily. 07/15/18  Yes [provider]  glucose  blood (COOL BLOOD GLUCOSE TEST STRIPS) test strip Use as instructed 01/05/18  Yes [name], FNP  hydrALAZINE (APRESOLINE) 10 MG tablet Take 1 tablet (10 mg total) by mouth every 8 (eight) hours. 11/03/18  Yes Kayleen Memos, DO  Insulin Glargine (LANTUS SOLOSTAR) 100 UNIT/ML Solostar Pen Inject 30 Units into the skin daily. 09/14/18  Yes [name], FNP  insulin lispro (HUMALOG) 100 UNIT/ML injection Inject 10 units under skin 3 times a day, with meals, for blood glucose levels greater than 125. 09/14/18 09/14/19 Yes [name], FNP  Insulin Syringes, Disposable, U-100 0.3 ML MISC 1 Syringe by Does not apply route 3 (three) times daily. 09/22/18  Yes [name], FNP  Lancets [facility] ULTRASOFT) lancets Use as instructed 01/05/18  Yes [name], FNP  oxybutynin (DITROPAN) 5 MG tablet TAKE 1 TABLET (5 MG TOTAL) BY MOUTH 2 (TWO) TIMES DAILY. 10/19/18  Yes [name], FNP  oxyCODONE 10 MG TABS Take 1 tablet (10 mg total) by mouth daily as needed for up to 5 doses for severe pain or breakthrough pain (pain).  11/03/18  Yes Kayleen Memos, DO  acetaminophen (TYLENOL) 325 MG tablet Take 2 tablets (650 mg total) by mouth every 6 (six) hours as needed. Patient not taking: Reported on 11/10/2018 08/08/18 08/08/19  Mercy Riding, MD  collagenase (SANTYL) ointment Apply topically 2 (two) times daily at 10 AM and 5 PM. Patient not taking: Reported on 11/10/2018 11/03/18   Kayleen Memos, DO  silver sulfADIAZINE (SILVADENE) 1 % cream Apply 1 application topically daily. Apply to wound daily Patient not taking: Reported on 11/10/2018 11/06/18   [name], MD    Family History Family History  Problem Relation Age of Onset  . Hypertension Mother   . Arthritis Father   . Hypertension Father   . Hypertension Brother   . Cancer Maternal Grandmother 5       unknown type of cancer  . Depression Paternal Grandmother     Social History Social History   Tobacco Use  . Smoking  status: Never Smoker  . Smokeless tobacco: Never Used  Substance Use Topics  . Alcohol use: Never    Frequency: Never  . Drug use: Never    Comment: OTC ASA     Allergies   Sulfur and Sulfa antibiotics   Review of Systems Review of Systems  Constitutional: Negative for chills and fever.  HENT: Negative for ear pain and sore throat.   Eyes: Negative for pain and visual disturbance.  Respiratory: Negative for cough and shortness of breath.   Cardiovascular: Positive for chest pain. Negative for palpitations.  Gastrointestinal: Negative for abdominal pain and vomiting.  Genitourinary: Negative for dysuria and hematuria.  Musculoskeletal: Negative for arthralgias and back pain.  Skin: Negative for color change and rash.  Neurological: Negative for seizures and syncope.  All other systems reviewed and are negative.    Physical Exam Updated Vital Signs BP (!) 160/102 (BP Location: Left Arm)   Pulse 68   Temp (!) 97.4 F (36.3 C) (Oral)   Resp 16   Ht '5\' 10"'$  (1.778 m)   Wt 112.5 kg   SpO2 100%   BMI 35.58 kg/m   Physical Exam Vitals signs and nursing note reviewed.  Constitutional:      Appearance: He is well-developed.  HENT:     Head: Normocephalic and atraumatic.  Eyes:     Conjunctiva/sclera: Conjunctivae normal.  Neck:     Musculoskeletal: Neck supple.  Cardiovascular:     Rate and Rhythm: Normal rate and regular rhythm.     Heart sounds: No murmur.  Pulmonary:     Effort: Pulmonary effort is normal. No respiratory distress.     Breath sounds: Normal breath sounds.  Abdominal:     Palpations: Abdomen is soft.     Tenderness: There is no abdominal tenderness.  Skin:    General: Skin is warm and dry.  Neurological:     Mental Status: He is alert.      ED Treatments / Results  Labs (all labs ordered are listed, but only abnormal results are displayed) Labs Reviewed  BASIC METABOLIC PANEL - Abnormal; Notable for the following components:       Result Value   Glucose, Bld 223 (*)    BUN 23 (*)    Creatinine, Ser 1.31 (*)    All other components within normal limits  CBC - Abnormal; Notable for the following components:   RBC 4.14 (*)    Hemoglobin 10.6 (*)    HCT 34.8 (*)  MCH 25.6 (*)    Platelets 422 (*)    All other components within normal limits  TROPONIN I (HIGH SENSITIVITY) - Abnormal; Notable for the following components:   Troponin I (High Sensitivity) >27,000 (*)    All other components within normal limits  TROPONIN I (HIGH SENSITIVITY) - Abnormal; Notable for the following components:   Troponin I (High Sensitivity) >27,000 (*)    All other components within normal limits  SARS CORONAVIRUS 2 (HOSPITAL ORDER, [facility])  HEPATIC FUNCTION PANEL  TSH  RAPID URINE DRUG SCREEN, HOSP PERFORMED  CBC  LIPID PANEL  BASIC METABOLIC PANEL  CBC  CREATININE, SERUM  POCT ACTIVATED CLOTTING TIME  POCT ACTIVATED CLOTTING TIME  POCT ACTIVATED CLOTTING TIME  POCT ACTIVATED CLOTTING TIME  TROPONIN I (HIGH SENSITIVITY)    EKG EKG Interpretation  Date/Time:  Tuesday November 10 2018 15:23:29 EDT Ventricular Rate:  72 PR Interval:  168 QRS Duration: 116 QT Interval:  400 QTC Calculation: 438 R Axis:   -15 Text Interpretation:  Sinus rhythm Left ventricular hypertrophy no STEMI   Confirmed by Madalyn Rob [phone]) on 11/10/2018 6:40:48 PM   Radiology Dg Chest 2 View  Result Date: 11/10/2018 CLINICAL DATA:  54 year old male with chest pain since yesterday. EXAM: CHEST - 2 VIEW COMPARISON:  10/28/2018 portable chest and earlier. FINDINGS: PA and lateral views. Stable cardiomegaly and mediastinal contours. Visualized tracheal air column is within normal limits. Low normal lung volumes. Both lungs appear clear. No pneumothorax or pleural effusion. No acute osseous abnormality identified. Negative visible bowel gas pattern. IMPRESSION: No acute cardiopulmonary abnormality. Electronically Signed    By: Genevie Ann M.D.   On: 11/10/2018 13:11    Procedures Procedures (including critical care time)  Medications Ordered in ED Medications  sodium chloride flush (NS) 0.9 % injection 3 mL ( Intravenous MAR Unhold 11/10/18 1833)  nitroGLYCERIN 50 mg in dextrose 5 % 250 mL (0.2 mg/mL) infusion (5 mcg/min Intravenous Rate/Dose Verify 11/10/18 2000)  sodium chloride flush (NS) 0.9 % injection 3 mL (3 mLs Intravenous Not Given 11/10/18 2043)  sodium chloride flush (NS) 0.9 % injection 3 mL ( Intravenous MAR Unhold 11/10/18 1833)  0.9 %  sodium chloride infusion ( Intravenous MAR Unhold 11/10/18 1833)  0.9 %  sodium chloride infusion ( Intravenous Rate/Dose Verify 11/10/18 2000)  oxyCODONE (Oxy IR/ROXICODONE) immediate release tablet 10 mg ( Oral MAR Unhold 11/10/18 1833)  amoxicillin-clavulanate (AUGMENTIN) 875-125 MG per tablet 1 tablet ( Oral MAR Unhold 11/10/18 1833)  bictegravir-emtricitabine-tenofovir AF (BIKTARVY) 50-200-25 MG per tablet 1 tablet ( Oral MAR Unhold 11/10/18 1833)  dapsone tablet 100 mg ( Oral MAR Unhold 11/10/18 1833)  amLODipine (NORVASC) tablet 10 mg ( Oral MAR Unhold 11/10/18 1833)  escitalopram (LEXAPRO) tablet 20 mg ( Oral MAR Unhold 11/10/18 1833)  oxybutynin (DITROPAN) tablet 5 mg ( Oral MAR Unhold 11/10/18 1833)  aspirin EC tablet 81 mg ( Oral MAR Unhold 11/10/18 1833)  nitroGLYCERIN (NITROSTAT) SL tablet 0.4 mg ( Sublingual MAR Unhold 11/10/18 1833)  acetaminophen (TYLENOL) tablet 650 mg ( Oral MAR Unhold 11/10/18 1833)  ondansetron (ZOFRAN) injection 4 mg ( Intravenous MAR Unhold 11/10/18 1833)  insulin aspart (novoLOG) injection 0-9 Units ( Subcutaneous MAR Unhold 11/10/18 1833)  insulin glargine (LANTUS) injection 30 Units (has no administration in time range)  labetalol (NORMODYNE) injection 10 mg (has no administration in time range)  hydrALAZINE (APRESOLINE) injection 10 mg (has no administration in time range)  heparin injection 5,000 Units (  has no administration in time range)  0.9 %  sodium  chloride infusion (has no administration in time range)  sodium chloride flush (NS) 0.9 % injection 3 mL (has no administration in time range)  sodium chloride flush (NS) 0.9 % injection 3 mL (has no administration in time range)  0.9 %  sodium chloride infusion (has no administration in time range)  ticagrelor (BRILINTA) tablet 90 mg (has no administration in time range)  carvedilol (COREG) tablet 3.125 mg (3.125 mg Oral Given 11/10/18 1952)  aspirin chewable tablet 324 mg (324 mg Oral Given 11/10/18 1527)  nitroGLYCERIN (NITROSTAT) SL tablet 0.4 mg (0.4 mg Sublingual Given 11/10/18 1527)  0.9 %  sodium chloride infusion (20 mL/hr Intravenous New Bag/Given 11/10/18 1641)  verapamil (ISOPTIN) 2.5 MG/ML injection (has no administration in time range)  heparin 1000 UNIT/ML injection (has no administration in time range)  lidocaine (PF) (XYLOCAINE) 1 % injection (has no administration in time range)  Heparin (Porcine) in NaCl 1000-0.9 UT/500ML-% SOLN (has no administration in time range)  fentaNYL (SUBLIMAZE) 100 MCG/2ML injection (has no administration in time range)  midazolam (VERSED) 2 MG/2ML injection (has no administration in time range)  nitroGLYCERIN 100 mcg/mL intra-arterial injection (has no administration in time range)  ticagrelor (BRILINTA) 90 MG tablet (has no administration in time range)  ticagrelor (BRILINTA) 90 MG tablet (has no administration in time range)     Initial Impression / Assessment and Plan / ED Course  I have reviewed the triage vital signs and the nursing notes.  Pertinent labs & imaging results that were available during my care of the patient were reviewed by me and considered in my medical decision making (see chart for details).  Clinical Course as of Nov 10 2046  Tue Nov 10, 2018  1507 Received critical lab value, will assess patient, consult cardiology   [RD]    Clinical Course User Index [RD] Lucrezia Starch, MD       50s regimen presents regarding  chief complaint chest pain, noted to have severely elevated troponin level, no acute ischemic changes on EKG.  After being notified of patient's critical lab value, I assess patient upon arrival to his room and quickly consulted cardiology.  Cardiology promptly assessed patient and agreed to admission for suspected acute coronary syndrome.  Started on heparin, gave aspirin and nitroglycerin.  Cardiology planning to take patient soon to Cath Lab.  Vitals remained stable while in the department.  Final Clinical Impressions(s) / ED Diagnoses   Final diagnoses:  Acute coronary syndrome [facility])  NSTEMI (non-ST elevated myocardial infarction) (HCC)  Elevated troponin    ED Discharge Orders         Ordered    AMB Referral to Cardiac Rehabilitation - Phase II     11/10/18 1809           Lucrezia Starch, MD 11/10/18 2050

## 2018-11-10 NOTE — ED Triage Notes (Signed)
Pt presents with chest pain that started yesterday. Pt reports he was just sitting when this hit him. Pt reports the pain is sharp in nature and in the upper left chest wall.

## 2018-11-10 NOTE — Progress Notes (Signed)
PT arrived on unit and educated about TR band and keeping arm still.  2 RNs attempted skin assessment but pt refused to turn so we could assess his backside.  Pt also refused sacral foam despite skin breakdown prevention education.  Possible pressure injury noted on Rt leg prosthesis. Site cleansed, foam placed and WOC consulted.

## 2018-11-10 NOTE — ED Provider Notes (Signed)
[facility] PROGRESSIVE CARE Provider Note   CSN: [id number] Arrival date & time: 08/07/18  0347     History   Chief Complaint Chief Complaint  Patient presents with   Urinary Frequency    Hyperglycemia    HPI ROSHAN ROBACK is a 53 y.o. male.  Patient has past medical history uncontrolled diabetes, hypertension, CKD presents emerged department with a chief complaint of chest pain.  States chest pain started yesterday, evening around 6:00, states been constant, severe, currently 6 out of 10 in severity, sharp, nonradiating.  No associated shortness of breath, has had some associated nausea.  Patient denies prior history of coronary artery disease, denies being recently evaluated by cardiologist.     HPI  Past Medical History:  Diagnosis Date   Abscess of right foot    abscess/ulcer of R transtibial amputation requiring IV abx   AIDS ([place])    Chronic anemia    Chronic knee pain    right   Chronic pain    CKD (chronic kidney disease), stage II    Diabetes type 2, uncontrolled ([place])    HgA1c 17.6 (04/27/2010)   Diabetic foot ulcer ([place]) 01/2017   right foot   Erectile dysfunction    Genital warts    HIV (human immunodeficiency virus infection) ([place]) 2009   CD4 count 100, VL 13800 (05/01/2010)   Hypertension    Osteomyelitis ([place])    h/o hand   Osteomyelitis of metatarsal ([place]) 04/28/2017    Patient Active Problem List   Diagnosis Date Noted   Acute myocardial infarction ([place]) 11/10/2018   CKD (chronic kidney disease), stage II 11/10/2018   Amputation stump infection ([place]) 10/28/2018   Type II diabetes mellitus with renal manifestations ([place]) 08/07/2018   Uncontrolled type 2 diabetes mellitus with hyperosmolar nonketotic hyperglycemia ([place]) 08/07/2018   Non-pressure chronic ulcer of right calf limited to breakdown of skin ([place]) 07/06/2018   Type 2 diabetes mellitus without complication, without long-term current use of insulin ([place]) 06/15/2018    Chronic low back pain without sciatica 06/15/2018   Idiopathic chronic venous hypertension of left lower extremity with ulcer and inflammation (HCC)    Venous stasis ulcer of left calf limited to breakdown of skin without varicose veins ([place]) 04/23/2018   Acquired absence of right leg below knee ([place]) 06/13/2017   Acute renal failure superimposed on stage 3 chronic kidney disease ([place]) 03/15/2017   Diabetic polyneuropathy associated with type 2 diabetes mellitus ([place]) 01/23/2017   AKI (acute kidney injury) ([place]) 09/28/2016   Nausea vomiting and diarrhea 09/28/2016   Onychomycosis of multiple toenails with type 2 diabetes mellitus ([place]) 08/29/2015   MRSA carrier 04/20/2014   Penile wart 02/22/2014   HIV disease ([place])    Insulin-requiring or dependent type II diabetes mellitus ([place]) 02/04/2014   Dental anomaly 11/20/2012   Arthritis of right knee 02/23/2012   Hyperlipidemia 11/10/2011   Chronic pain 08/07/2011   Meralgia paraesthetica 04/23/2011   ERECTILE DYSFUNCTION 08/22/2008   Essential hypertension 05/19/2008    Past Surgical History:  Procedure Laterality Date   AMPUTATION Right 05/02/2017   Procedure: AMPUTATION TRANSMETARSAL;  Surgeon: [name], MD;  Location: [facility];  Service: Orthopedics;  Laterality: Right;   AMPUTATION Right 06/13/2017   Procedure: RIGHT BELOW KNEE AMPUTATION;  Surgeon: [name], MD;  Location: [facility];  Service: Orthopedics;  Laterality: Right;   BELOW KNEE LEG AMPUTATION Right 06/13/2017   BELOW KNEE LEG AMPUTATION     HERNIA REPAIR  I&D EXTREMITY Left 08/21/2014   Procedure: INCISION AND DRAINAGE LEFT SMALL FINGER;  Surgeon: Leanora Cover, MD;  Location: [facility];  Service: Orthopedics;  Laterality: Left;   I&D EXTREMITY Right 03/18/2017   Procedure: IRRIGATION AND DEBRIDEMENT EXTREMITY;  Surgeon: [name], MD;  Location: [facility];  Service: Orthopedics;  Laterality: Right;   MINOR IRRIGATION AND DEBRIDEMENT OF  WOUND Right 04/22/2014   Procedure: IRRIGATION AND DEBRIDEMENT OF RIGHT NECK ABCESS;  Surgeon: Jerrell Belfast, MD;  Location: [facility];  Service: ENT;  Laterality: Right;   MULTIPLE EXTRACTIONS WITH ALVEOLOPLASTY N/A 01/18/2013   Procedure: MULTIPLE EXTRACION 3, 6, 7, 10, 11, 13, 21, 22, 27, 28, 29, 30 WITH ALVEOLOPLASTY;  Surgeon: Gae Bon, DDS;  Location: [facility];  Service: Oral Surgery;  Laterality: N/A;   SKIN SPLIT GRAFT Right 03/21/2017   Procedure: IRRIGATION AND DEBRIDEMENT RIGHT FOOT AND APPLY SPLIT THICKNESS SKIN GRAFT AND WOUND VAC;  Surgeon: [name], MD;  Location: [facility];  Service: Orthopedics;  Laterality: Right;   TEE WITHOUT CARDIOVERSION N/A 05/02/2017   Procedure: TRANSESOPHAGEAL ECHOCARDIOGRAM (TEE);  Surgeon: Acie Fredrickson Wonda Cheng, MD;  Location: [facility] OR;  Service: Cardiovascular;  Laterality: N/A;  coincidental to orthopedic case        Home Medications    Prior to Admission medications   Medication Sig Start Date End Date Taking? Authorizing Provider  amLODipine (NORVASC) 10 MG tablet Take 1 tablet (10 mg total) by mouth daily. 07/09/18  Yes [name], FNP  escitalopram (LEXAPRO) 20 MG tablet Take 20 mg by mouth daily. 07/15/18  Yes [provider]  acetaminophen (TYLENOL) 325 MG tablet Take 2 tablets (650 mg total) by mouth every 6 (six) hours as needed. Patient not taking: Reported on 11/10/2018 08/08/18 08/08/19  Mercy Riding, MD  amoxicillin-clavulanate (AUGMENTIN) 875-125 MG tablet Take 1 tablet by mouth every 12 (twelve) hours for 10 days. 11/03/18 11/13/18  Kayleen Memos, DO  bictegravir-emtricitabine-tenofovir AF (BIKTARVY) 50-200-25 MG TABS tablet Take 1 tablet by mouth daily. 10/15/18   [name], MD  blood glucose meter kit and supplies Dispense based on patient and insurance preference. Use up to four times daily as directed. (FOR ICD-10 E10.9, E11.9). 07/31/18   [name], FNP  collagenase (SANTYL) ointment Apply topically 2 (two) times  daily at 10 AM and 5 PM. Patient not taking: Reported on 11/10/2018 11/03/18   Kayleen Memos, DO  dapsone 100 MG tablet Take 1 tablet (100 mg total) by mouth daily. 11/03/18   Kayleen Memos, DO  EASY COMFORT PEN NEEDLES 31G X 5 MM MISC INJECT 20 UNITS DAILY AS DIRECTED. Patient taking differently: 20 Units See admin instructions. As directed 10/19/18   [name], FNP  glucose blood (COOL BLOOD GLUCOSE TEST STRIPS) test strip Use as instructed 01/05/18   [name], FNP  hydrALAZINE (APRESOLINE) 10 MG tablet Take 1 tablet (10 mg total) by mouth every 8 (eight) hours. 11/03/18   Kayleen Memos, DO  Insulin Glargine (LANTUS SOLOSTAR) 100 UNIT/ML Solostar Pen Inject 30 Units into the skin daily. 09/14/18   [name], FNP  insulin lispro (HUMALOG) 100 UNIT/ML injection Inject 10 units under skin 3 times a day, with meals, for blood glucose levels greater than 125. 09/14/18 09/14/19  [name], FNP  Insulin Syringes, Disposable, U-100 0.3 ML MISC 1 Syringe by Does not apply route 3 (three) times daily. 09/22/18   [name], FNP  Lancets (ONETOUCH ULTRASOFT) lancets Use as instructed 01/05/18   [name], FNP  oxybutynin (DITROPAN) 5 MG tablet TAKE 1 TABLET (5 MG TOTAL) BY MOUTH 2 (TWO) TIMES DAILY. 10/19/18   [name], FNP  oxyCODONE 10 MG TABS Take 1 tablet (10 mg total) by mouth daily as needed for up to 5 doses for severe pain or breakthrough pain (pain). 11/03/18   Kayleen Memos, DO  silver sulfADIAZINE (SILVADENE) 1 % cream Apply 1 application topically daily. Apply to wound daily Patient not taking: Reported on 11/10/2018 11/06/18   [name], MD    Family History Family History  Problem Relation Age of Onset   Hypertension Mother    Arthritis Father    Hypertension Father    Hypertension Brother    Cancer Maternal Grandmother 10       unknown type of cancer   Depression Paternal Grandmother     Social History Social History   Tobacco  Use   Smoking status: Never Smoker   Smokeless tobacco: Never Used  Substance Use Topics   Alcohol use: Never    Frequency: Never   Drug use: Never    Comment: OTC ASA     Allergies   Sulfur and Sulfa antibiotics   Review of Systems Review of Systems  Constitutional: Negative for chills and fever.  HENT: Negative for ear pain and sore throat.   Eyes: Negative for pain and visual disturbance.  Respiratory: Negative for cough and shortness of breath.   Cardiovascular: Positive for chest pain. Negative for palpitations.  Gastrointestinal: Positive for nausea. Negative for abdominal pain and vomiting.  Genitourinary: Negative for dysuria and hematuria.  Musculoskeletal: Negative for arthralgias and back pain.  Skin: Negative for color change and rash.  Neurological: Negative for seizures and syncope.  All other systems reviewed and are negative.    Physical Exam Updated Vital Signs BP (!) 134/97    Pulse 77    Temp 97.8 F (36.6 C) (Oral)    Resp 16    Ht '5\' 10"'$  (1.778 m)    Wt 120.5 kg    SpO2 100%    BMI 38.12 kg/m   Physical Exam Vitals signs and nursing note reviewed.  Constitutional:      Appearance: He is well-developed.  HENT:     Head: Normocephalic and atraumatic.  Eyes:     Conjunctiva/sclera: Conjunctivae normal.  Neck:     Musculoskeletal: Neck supple.  Cardiovascular:     Rate and Rhythm: Normal rate and regular rhythm.     Heart sounds: No murmur.  Pulmonary:     Effort: Pulmonary effort is normal. No respiratory distress.     Breath sounds: Normal breath sounds.  Abdominal:     Palpations: Abdomen is soft.     Tenderness: There is no abdominal tenderness.  Skin:    General: Skin is warm and dry.  Neurological:     Mental Status: He is alert.      ED Treatments / Results  Labs (all labs ordered are listed, but only abnormal results are displayed) Labs Reviewed  MRSA PCR SCREENING - Abnormal; Notable for the following components:       Result Value   MRSA by PCR POSITIVE (*)    All other components within normal limits  CBC WITH DIFFERENTIAL/PLATELET - Abnormal; Notable for the following components:   Hemoglobin 11.9 (*)    HCT 36.1 (*)    All other components within normal limits  BASIC METABOLIC PANEL - Abnormal; Notable for the following components:   Sodium 116 (*)    Chloride 85 (*)    CO2 20 (*)    Glucose, Bld 935 (*)    BUN 34 (*)    Creatinine, Ser 2.42 (*)    GFR calc non Af Amer 29 (*)    GFR calc Af Amer 34 (*)    All other components within normal limits  URINALYSIS, ROUTINE W REFLEX MICROSCOPIC - Abnormal; Notable for the following components:   Color, Urine COLORLESS (*)    Glucose, UA >=500 (*)    Hgb urine dipstick SMALL (*)    Protein, ur 30 (*)    All other components within normal limits  BASIC METABOLIC PANEL - Abnormal; Notable for the following components:   Sodium 132 (*)    Glucose, Bld 155 (*)    BUN 29 (*)    Creatinine, Ser 2.15 (*)    GFR calc non Af Amer 34 (*)    GFR calc Af Amer 39 (*)    All other components within normal limits  GLUCOSE, CAPILLARY - Abnormal; Notable for the following components:   Glucose-Capillary 162 (*)    All other components within normal limits  GLUCOSE, CAPILLARY - Abnormal; Notable for the following components:   Glucose-Capillary 156 (*)    All other components within normal limits  GLUCOSE, CAPILLARY - Abnormal; Notable for the following components:   Glucose-Capillary 155 (*)    All other components within normal limits  GLUCOSE, CAPILLARY - Abnormal; Notable for the following components:   Glucose-Capillary 162 (*)    All other components within normal limits  GLUCOSE, CAPILLARY - Abnormal; Notable for the following components:   Glucose-Capillary 178 (*)    All other components within normal limits  HEMOGLOBIN A1C - Abnormal; Notable for the following components:   Hgb A1c MFr Bld 15.0 (*)    All other components within normal limits    BASIC METABOLIC PANEL - Abnormal; Notable for the following components:   CO2 20 (*)    Glucose, Bld 165 (*)    BUN 26 (*)    Creatinine, Ser 1.94 (*)    GFR calc non Af Amer 38 (*)    GFR calc Af Amer 44 (*)    All other components within normal limits  GLUCOSE, CAPILLARY - Abnormal; Notable for the following components:   Glucose-Capillary 338 (*)    All other components within normal limits  GLUCOSE, CAPILLARY - Abnormal; Notable for the following components:   Glucose-Capillary 257 (*)    All other components within normal limits  GLUCOSE, CAPILLARY - Abnormal; Notable for the following components:   Glucose-Capillary 158 (*)    All other components within normal limits  CBG MONITORING, ED - Abnormal; Notable for the following components:   Glucose-Capillary >600 (*)    All other components within normal limits  POCT I-STAT EG7 - Abnormal; Notable for the following components:   pCO2, Ven 41.4 (*)    pO2, Ven 30.0 (*)    Acid-base deficit 3.0 (*)    Sodium 119 (*)    HCT 38.0 (*)    Hemoglobin 12.9 (*)    All other components within normal limits  CBG MONITORING, ED - Abnormal; Notable for the following components:   Glucose-Capillary 590 (*)    All other components within normal limits  CBG MONITORING, ED - Abnormal; Notable for the following components:   Glucose-Capillary 466 (*)  All other components within normal limits  CBG MONITORING, ED - Abnormal; Notable for the following components:   Glucose-Capillary 261 (*)    All other components within normal limits  MAGNESIUM  PHOSPHORUS    EKG None EKG at 1523 sinus rhythm, rate 72, no st segment or t wave changes, normal axis and intervals   Radiology Dg Chest 2 View  Result Date: 11/10/2018 CLINICAL DATA:  53 year old male with chest pain since yesterday. EXAM: CHEST - 2 VIEW COMPARISON:  10/28/2018 portable chest and earlier. FINDINGS: PA and lateral views. Stable cardiomegaly and mediastinal contours.  Visualized tracheal air column is within normal limits. Low normal lung volumes. Both lungs appear clear. No pneumothorax or pleural effusion. No acute osseous abnormality identified. Negative visible bowel gas pattern. IMPRESSION: No acute cardiopulmonary abnormality. Electronically Signed   By: Genevie Ann M.D.   On: 11/10/2018 13:11    Procedures Procedures (including critical care time)  Medications Ordered in ED Medications  insulin regular, human (MYXREDLIN) 100 units/ 100 mL infusion ( Intravenous Rate/Dose Verify 08/07/18 1051)  sodium chloride 0.9 % bolus 1,000 mL (0 mLs Intravenous Stopped 08/07/18 0548)  sodium chloride 0.9 % bolus 1,000 mL (0 mLs Intravenous Stopped 08/07/18 0548)  amLODipine (NORVASC) tablet 10 mg (10 mg Oral Given 08/07/18 0441)  sodium chloride 0.9 % bolus 1,000 mL (0 mLs Intravenous Stopped 08/07/18 5449)     Initial Impression / Assessment and Plan / ED Course  I have reviewed the triage vital signs and the nursing notes.  Pertinent labs & imaging results that were available during my care of the patient were reviewed by me and considered in my medical decision making (see chart for details).        53 year old gentleman who presented to the emergency department with a chief complaint of chest pain.  EKG without acute STEMI.  Trop greater than 27,000.  I was notified patient's critical lab troponin as patient was being roomed, promptly evaluated patient and consulted cardiology who promptly evaluated patient as well.  Started on heparin, gave aspirin, nitro.  Cardiology admitted patient and plan to take to Cath Lab this afternoon.  Final Clinical Impressions(s) / ED Diagnoses   Final diagnoses:  Hyperglycemia  AKI (acute kidney injury) ([place])  NSTEMI (non-ST elevated myocardial infarction) (HCC)  Elevated troponin  Acute coronary syndrome [facility])      Lucrezia Starch, MD 11/10/18 [phone]

## 2018-11-10 NOTE — H&P (Addendum)
Cardiology History & Physical    [name] MRN: [id number]; DOB: November 06, 1965   Admission date: 11/10/2018  Primary Care Provider: System, Pcp Not In Primary Cardiologist: New to Dr. Irish Lack Primary Electrophysiologist:  None   Chief Complaint:  Chest pain (troponin >27,000)  Patient Profile:   Keith [name] is a 53 y.o. male with Uncontrolled DM (recent A1C >15.5), HTN, HIV, CKD stage II, chronic normocytic anemia, chronic pain, osteomyelitis, recent abscess/ulcer of R transtibial amputation requiring IV abx who is seen for late presentation of chest pain with marked troponin elevation.  History of Present Illness:   Keith [name] has no prior cardiac hx but had echo/TEE in 2019 for bacteremia showing normal EF, grade 1 DD. He was recently admitted with abscess/ulcer of stump requiring IV antibiotics in the context of severely uncontrolled DM as well as uncontrolled HTN. He went home on transition to oral antibiotics. He feels he has been improving from this.  However, last night around 6pm he developed substernal chest pain that was somewhat sharp in nature but very severe. He had associated vomiting and dyspnea. He thought he ought to go to the ER but opted to try and wait it out. He had a restless night and did not sleep at all. This afternoon when pain persisted he drove himself to the ER. He received 329m ASA And 1 SL NTG. Initial BP 138/93, f/u BP 192/121, then 168/109. EKG shows NSR with LVH, otherwise unremarkable. Labs show Cr 1.31 and 10.6 (similar to prior), hsTroponin >27,000. He still reports ongoing 8/10 chest pain although states he does not feel as bad as last night and is requesting to eat. ED has ordered heparin bolus. EKG is nonacute.  Past Medical History:  Diagnosis Date  . Abscess of right foot    abscess/ulcer of R transtibial amputation requiring IV abx  . AIDS ([place]   . Chronic anemia   . Chronic knee pain    right  . Chronic pain   . CKD  (chronic kidney disease), stage II   . Diabetes type 2, uncontrolled ([place]    HgA1c 17.6 (04/27/2010)  . Diabetic foot ulcer ([facility] 01/2017   right foot  . Erectile dysfunction   . Genital warts   . HIV (human immunodeficiency virus infection) ([facility] 2009   CD4 count 100, VL 13800 (05/01/2010)  . Hypertension   . Osteomyelitis ([place]    h/o hand  . Osteomyelitis of metatarsal ([facility] 04/28/2017    Past Surgical History:  Procedure Laterality Date  . AMPUTATION Right 05/02/2017   Procedure: AMPUTATION TRANSMETARSAL;  Surgeon: DNewt Minion MD;  [place] [facility]  Service: Orthopedics;  Laterality: Right;  . AMPUTATION Right 06/13/2017   Procedure: RIGHT BELOW KNEE AMPUTATION;  Surgeon: DNewt Minion MD;  [place] [facility]  Service: Orthopedics;  Laterality: Right;  . BELOW KNEE LEG AMPUTATION Right 06/13/2017  . BELOW KNEE LEG AMPUTATION    . HERNIA REPAIR    . I&D EXTREMITY Left 08/21/2014   Procedure: INCISION AND DRAINAGE LEFT SMALL FINGER;  Surgeon: KLeanora Cover MD;  [place] [facility]  Service: Orthopedics;  Laterality: Left;  . I&D EXTREMITY Right 03/18/2017   Procedure: IRRIGATION AND DEBRIDEMENT EXTREMITY;  Surgeon: DNewt Minion MD;  [place]  Service: Orthopedics;  Laterality: Right;  . MINOR IRRIGATION AND DEBRIDEMENT OF WOUND Right 04/22/2014   Procedure: IRRIGATION AND DEBRIDEMENT OF RIGHT NECK ABCESS;  Surgeon: DJerrell Belfast MD;  [facility];  Service: ENT;  Laterality: Right;  . MULTIPLE EXTRACTIONS WITH ALVEOLOPLASTY N/A 01/18/2013   Procedure: MULTIPLE EXTRACION 3, 6, 7, 10, 11, 13, 21, 22, 27, 28, 29, 30 WITH ALVEOLOPLASTY;  Surgeon: Gae Bon, DDS;  Location: [place];  Service: Oral Surgery;  Laterality: N/A;  . SKIN SPLIT GRAFT Right 03/21/2017   Procedure: IRRIGATION AND DEBRIDEMENT RIGHT FOOT AND APPLY SPLIT THICKNESS SKIN GRAFT AND WOUND VAC;  Surgeon: [name], MD;  Location: [place];  Service: Orthopedics;  Laterality: Right;  . TEE WITHOUT  CARDIOVERSION N/A 05/02/2017   Procedure: TRANSESOPHAGEAL ECHOCARDIOGRAM (TEE);  Surgeon: Acie Fredrickson Wonda Cheng, MD;  Location[facility];  Service: Cardiovascular;  Laterality: N/A;  coincidental to orthopedic case     Medications Prior to Admission: Prior to Admission medications   Medication Sig Start Date End Date Taking? Authorizing Provider  amLODipine (NORVASC) 10 MG tablet Take 1 tablet (10 mg total) by mouth daily. 07/09/18  Yes [name], FNP  amoxicillin-clavulanate (AUGMENTIN) 875-125 MG tablet Take 1 tablet by mouth every 12 (twelve) hours for 10 days. 11/03/18 11/13/18 Yes Hall, Carole N, DO  bictegravir-emtricitabine-tenofovir AF (BIKTARVY) 50-200-25 MG TABS tablet Take 1 tablet by mouth daily. 10/15/18  Yes [name], MD  blood glucose meter kit and supplies Dispense based on patient and insurance preference. Use up to four times daily as directed. (FOR ICD-10 E10.9, E11.9). 07/31/18  Yes [name], FNP  dapsone 100 MG tablet Take 1 tablet (100 mg total) by mouth daily. 11/03/18  Yes Hall, Carole N, DO  EASY COMFORT PEN NEEDLES 31G X 5 MM MISC INJECT 20 UNITS DAILY AS DIRECTED. Patient taking differently: 20 Units See admin instructions. As directed 10/19/18  Yes [name], FNP  escitalopram (LEXAPRO) 20 MG tablet Take 20 mg by mouth daily. 07/15/18  Yes [provider]  glucose blood (COOL BLOOD GLUCOSE TEST STRIPS) test strip Use as instructed 01/05/18  Yes [name], FNP  hydrALAZINE (APRESOLINE) 10 MG tablet Take 1 tablet (10 mg total) by mouth every 8 (eight) hours. 11/03/18  Yes Kayleen Memos, DO  Insulin Glargine (LANTUS SOLOSTAR) 100 UNIT/ML Solostar Pen Inject 30 Units into the skin daily. 09/14/18  Yes [name], FNP  insulin lispro (HUMALOG) 100 UNIT/ML injection Inject 10 units under skin 3 times a day, with meals, for blood glucose levels greater than 125. 09/14/18 09/14/19 Yes [name], FNP  Insulin Syringes, Disposable, U-100 0.3  ML MISC 1 Syringe by Does not apply route 3 (three) times daily. 09/22/18  Yes [name], FNP  Lancets [facility] ULTRASOFT) lancets Use as instructed 01/05/18  Yes [name], FNP  oxybutynin (DITROPAN) 5 MG tablet TAKE 1 TABLET (5 MG TOTAL) BY MOUTH 2 (TWO) TIMES DAILY. 10/19/18  Yes [name], FNP  oxyCODONE 10 MG TABS Take 1 tablet (10 mg total) by mouth daily as needed for up to 5 doses for severe pain or breakthrough pain (pain). 11/03/18  Yes Kayleen Memos, DO  acetaminophen (TYLENOL) 325 MG tablet Take 2 tablets (650 mg total) by mouth every 6 (six) hours as needed. Patient not taking: Reported on 11/10/2018 08/08/18 08/08/19  Mercy Riding, MD  collagenase (SANTYL) ointment Apply topically 2 (two) times daily at 10 AM and 5 PM. Patient not taking: Reported on 11/10/2018 11/03/18   Kayleen Memos, DO  silver sulfADIAZINE (SILVADENE) 1 % cream Apply 1 application topically daily. Apply to  wound daily Patient not taking: Reported on 11/10/2018 11/06/18   [name], MD     Allergies:    Allergies  Allergen Reactions  . Sulfur Itching    Patient stated he's allergic to "sulfur" AND "sulfa"  . Sulfa Antibiotics Itching    Social History:   Social History   Socioeconomic History  . Marital status: Single    Spouse name: Not on file  . Number of children: 3  . Years of education: 46  . Highest education level: Not on file  Occupational History  . Occupation: disabled  Social Needs  . Financial resource strain: Not on file  . Food insecurity    Worry: Not on file    Inability: Not on file  . Transportation needs    Medical: Not on file    Non-medical: Not on file  Tobacco Use  . Smoking status: Never Smoker  . Smokeless tobacco: Never Used  Substance and Sexual Activity  . Alcohol use: Never    Frequency: Never  . Drug use: Never    Comment: OTC ASA  . Sexual activity: Yes    Partners: Female    Comment: given condoms  Lifestyle  . Physical activity     Days per week: Not on file    Minutes per session: Not on file  . Stress: Not on file  Relationships  . Social Herbalist on phone: Not on file    Gets together: Not on file    Attends religious service: Not on file    Active member of club or organization: Not on file    Attends meetings of clubs or organizations: Not on file    Relationship status: Not on file  . Intimate partner violence    Fear of current or ex partner: Not on file    Emotionally abused: Not on file    Physically abused: Not on file    Forced sexual activity: Not on file  Other Topics Concern  . Not on file  Social History Narrative   ** Merged History Encounter **       Worked for the city of [place] for 18 years.   Unemployed.    Applying for disability.   Medicaid patient.     Family History:   The patient's family history includes Arthritis in his father; Cancer (age of onset: 50) in his maternal grandmother; Depression in his paternal grandmother; Hypertension in his brother, father, and mother.    ROS:  Please see the history of present illness.  All other ROS reviewed and negative.     Physical Exam/Data:   Vitals:   11/10/18 1245 11/10/18 1328 11/10/18 1517 11/10/18 1600  BP:  (!) 138/93 (!) 192/121 (!) 163/99  Pulse:  75 72 74  Resp:  _0 Temp:  98.1 F (36.7 C) 98.2 F (36.8 C)   TempSrc:  Oral Oral   SpO2:  100% 100% 99%  Weight: 112.5 kg     Height: 5' 10" (1.778 m)      No intake or output data in the 24 hours ending 11/10/18 1634 Last 3 Weights 11/10/2018 11/06/2018 11/03/2018  Weight (lbs) 248 lb 246 lb 246 lb 7.6 oz  Weight (kg) 112.492 kg 111.585 kg 111.8 kg  Some encounter information is confidential and restricted. Go to Review Flowsheets activity to see all data.     Body mass index is 35.58 kg/m.  Wt Readings from Last 3 Encounters:  11/10/18 112.5 kg  11/06/18 111.6 kg  11/03/18 111.8 kg    Physical Exam: General: Well developed, well nourished  AAM in no acute distress. Head: Normocephalic, atraumatic, sclera non-icteric, no xanthomas, nares are without discharge.  Neck: Negative for carotid bruits. JVD not elevated. Lungs: Clear bilaterally to auscultation without wheezes, rales, or rhonchi. Breathing is unlabored. Heart: RRR with S1 S2. No murmurs, rubs, or gallops appreciated. Abdomen: Soft, non-tender, non-distended with normoactive bowel sounds. No hepatomegaly. No rebound/guarding. No obvious abdominal masses. Msk:  Strength and tone appear normal for age. Extremities: No clubbing or cyanosis. S/P R BKA with dry dressing in prosthetic  Neuro: Alert and oriented X 3. No focal deficit. No facial asymmetry. Moves all extremities spontaneously. Psych:  Responds to questions appropriately with a normal affect.    EKG:  The ECG that was done 8/4 was personally reviewed and demonstrates NSR nonspecific ST changes I, avL, TWI lead III - nonacute appearing  Relevant CV Studies: Reviewed above  Laboratory Data:  High Sensitivity Troponin:   Recent Labs  Lab 11/10/18 1330  TROPONINIHS >27,000*      Cardiac EnzymesNo results for input(s): TROPONINI in the last 168 hours. No results for input(s): TROPIPOC in the last 168 hours.  Chemistry Recent Labs  Lab 11/10/18 1330  NA 137  K 3.9  CL 101  CO2 26  GLUCOSE 223*  BUN 23*  CREATININE 1.31*  CALCIUM 9.6  GFRNONAA >60  GFRAA >60  ANIONGAP 10    No results for input(s): PROT, ALBUMIN, AST, ALT, ALKPHOS, BILITOT in the last 168 hours. Hematology Recent Labs  Lab 11/10/18 1330  WBC 7.6  RBC 4.14*  HGB 10.6*  HCT 34.8*  MCV 84.1  MCH 25.6*  MCHC 30.5  RDW 13.8  PLT 422*   BNPNo results for input(s): BNP, PROBNP in the last 168 hours.  DDimer No results for input(s): DDIMER in the last 168 hours.   Radiology/Studies:  Dg Chest 2 View  Result Date: 11/10/2018 CLINICAL DATA:  53 year old male with chest pain since yesterday. EXAM: CHEST - 2 VIEW COMPARISON:   10/28/2018 portable chest and earlier. FINDINGS: PA and lateral views. Stable cardiomegaly and mediastinal contours. Visualized tracheal air column is within normal limits. Low normal lung volumes. Both lungs appear clear. No pneumothorax or pleural effusion. No acute osseous abnormality identified. Negative visible bowel gas pattern. IMPRESSION: No acute cardiopulmonary abnormality. Electronically Signed   By: Genevie Ann M.D.   On: 11/10/2018 13:11    Assessment and Plan   1. Acute myocardial infarction - likely late presenting, with hsTroponin in "STEMI" like range. Given that EKG is not totally revealing would be concerned for acute circumflex lesion. Spoke with MD emergently - plan cath ASAP. Risks and benefits of cardiac catheterization have been discussed with the patient. These include bleeding, infection, kidney damage, stroke, heart attack, death. The patient understands these risks and is willing to proceed. Continue ASA. Anticipate BB addition post cath contingent on outcome. Given HIV regimen will consult pharm for statin options.  2. Uncontrolled HTN - continue home amlodipine and hydralazine. I see he was remotely on atenolol which he's no longer on for unclear reasons. I do not see specific adverse reaction to BB as a whole in the past, so would consider re-trial this admission particularly in light of MI.  3. Diabetes mellitus, uncontrolled - acutely will hold home regimen and add SSI. Resume Lantus in AM.  4. Recent abscess - today appears day 8  out of 10 of antibiotics. Continue for now consider discontinuation after day 10 if appears well clinically.  5. CKD stage II - follow Cr this admission.  6. Chronic appearing anemia - monitor. Appears at baseline.  Severity of Illness: The appropriate patient status for this patient is INPATIENT. Inpatient status is judged to be reasonable and necessary in order to provide the required intensity of service to ensure the patient's safety. The  patient's presenting symptoms, physical exam findings, and initial radiographic and laboratory data in the context of their chronic comorbidities is felt to place them at high risk for further clinical deterioration. Furthermore, it is not anticipated that the patient will be medically stable for discharge from the hospital within 2 midnights of admission. The following factors support the patient status of inpatient.   " The patient's presenting symptoms include chest pain. " The worrisome physical exam findings include HTN. " The initial radiographic and laboratory data are worrisome because of troponin elevation. " The chronic co-morbidities include DM HTN unknown lipid status.   * I certify that at the point of admission it is my clinical judgment that the patient will require inpatient hospital care spanning beyond 2 midnights from the point of admission due to high intensity of service, high risk for further deterioration and high frequency of surveillance required.*    For questions or updates, please contact Elsa Please consult [email or web address] for contact info under      Signed, Charlie Pitter, PA-C 11/10/2018, 4:34 PM   I have examined the patient and reviewed assessment and plan and discussed with patient.  Agree with above as stated.  I suspect he has an occluded circumflex with a late presentation.  Since he continues to have chest pain, will plan fir urgent cardiac cath.  ECG does not show acute ST changes.   Cardiac catheterization was discussed with the patient fully. The patient understands that risks include but are not limited to stroke (1 in 1000), death (1 in 57), kidney failure [usually temporary] (1 in 500), bleeding (1 in 200), allergic reaction [possibly serious] (1 in 200).  The patient understands and is willing to proceed.    He is willing to proceed.    Right radial access site preferable.  He has a right BKA as well. IV NTG and heparin until cath.     Larae Grooms

## 2018-11-10 NOTE — ED Notes (Signed)
Lab called critical lab troponin greater 27000 reported to Dr Roslynn Amble

## 2018-11-10 NOTE — Progress Notes (Addendum)
ANTICOAGULATION CONSULT NOTE - Initial Consult  Pharmacy Consult for heparin Indication: chest pain/ACS  Allergies  Allergen Reactions  . Sulfur Itching    Patient stated he's allergic to "sulfur" AND "sulfa"  . Sulfa Antibiotics Itching    Patient Measurements: Height: 5\' 10"  (177.8 cm) Weight: 248 lb (112.5 kg) IBW/kg (Calculated) : 73 Heparin Dosing Weight: 97.6 kg  Vital Signs: Temp: 98.2 F (36.8 C) (08/04 1517) Temp Source: Oral (08/04 1517) BP: 192/121 (08/04 1517) Pulse Rate: 72 (08/04 1517)  Labs: Recent Labs    11/10/18 1330  HGB 10.6*  HCT 34.8*  PLT 422*  CREATININE 1.31*  TROPONINIHS >27,000*    Estimated Creatinine Clearance: 81.9 mL/min (A) (by C-G formula based on SCr of 1.31 mg/dL (H)).   Medical History: Past Medical History:  Diagnosis Date  . Abscess of right foot    abscess/ulcer of R transtibial amputation requiring IV abx  . AIDS ([place])   . Chronic anemia   . Chronic knee pain    right  . Chronic pain   . CKD (chronic kidney disease), stage II   . Diabetes type 2, uncontrolled ([place])    HgA1c 17.6 (04/27/2010)  . Diabetic foot ulcer ([place]) 01/2017   right foot  . Erectile dysfunction   . Genital warts   . HIV (human immunodeficiency virus infection) ([place]) 2009   CD4 count 100, VL 13800 (05/01/2010)  . Hypertension   . Osteomyelitis ([place])    h/o hand  . Osteomyelitis of metatarsal ([place]) 04/28/2017    Medications:  Scheduled:  . sodium chloride flush  3 mL Intravenous Once   Infusions:    Assessment: 41 yoM admitted for chest pain. Not on anticoag PTA. Trop >27000 upon admission. Pharmacy consulted to dose heparin for ACS/STEMI. Goal of Therapy:  Heparin level 0.3-0.7 units/ml Monitor platelets by anticoagulation protocol: Yes   Plan:  Heparin bolus 4000 units Heparin infusion at 1200 units/hr Heparin level in 6 hours Monitor daily heparin level, CBC, s/sx of bleeding  Thank you for involving pharmacy in this  patient's care.  Berenice Bouton, PharmD PGY1 Pharmacy Resident Office phone: [phone]  11/10/2018,3:46 PM

## 2018-11-10 NOTE — Progress Notes (Signed)
   Covid swab still pending, just says collected. Spoke to ED nurse who indicates swab submitted was for 24-hour send-out instead of rapid, therefore needs to be re-swabbed. I spoke with Olive Bass in our cath lab since patient is being actively cathed. She spoke with microbiology who identified that the order is correct in the system but patient needs black top swab. Cath lab does not have any swabs so Olive Bass called over to [facility] who is able to procure swab for patient while still in cath lab, and run rapid test to be completed in the next 1-2 hours. [facility] is aware and states that the order presently in system should still process normally once correct swab is obtained. Gryphon Vanderveen PA-C

## 2018-11-11 ENCOUNTER — Encounter (HOSPITAL_COMMUNITY): Payer: Self-pay | Admitting: Cardiology

## 2018-11-11 ENCOUNTER — Inpatient Hospital Stay (HOSPITAL_COMMUNITY): Payer: Medicare Other

## 2018-11-11 DIAGNOSIS — L8990 Pressure ulcer of unspecified site, unspecified stage: Secondary | ICD-10-CM | POA: Insufficient documentation

## 2018-11-11 DIAGNOSIS — E118 Type 2 diabetes mellitus with unspecified complications: Secondary | ICD-10-CM

## 2018-11-11 DIAGNOSIS — N182 Chronic kidney disease, stage 2 (mild): Secondary | ICD-10-CM

## 2018-11-11 DIAGNOSIS — R079 Chest pain, unspecified: Secondary | ICD-10-CM

## 2018-11-11 LAB — ECHOCARDIOGRAM COMPLETE
Height: 70 in
Weight: 4017.66 oz

## 2018-11-11 LAB — CBC
HCT: 32.7 % — ABNORMAL LOW (ref 39.0–52.0)
Hemoglobin: 10.1 g/dL — ABNORMAL LOW (ref 13.0–17.0)
MCH: 25.6 pg — ABNORMAL LOW (ref 26.0–34.0)
MCHC: 30.9 g/dL (ref 30.0–36.0)
MCV: 83 fL (ref 80.0–100.0)
Platelets: 379 10*3/uL (ref 150–400)
RBC: 3.94 MIL/uL — ABNORMAL LOW (ref 4.22–5.81)
RDW: 13.8 % (ref 11.5–15.5)
WBC: 6.9 10*3/uL (ref 4.0–10.5)
nRBC: 0 % (ref 0.0–0.2)

## 2018-11-11 LAB — BASIC METABOLIC PANEL
Anion gap: 12 (ref 5–15)
BUN: 17 mg/dL (ref 6–20)
CO2: 20 mmol/L — ABNORMAL LOW (ref 22–32)
Calcium: 8.9 mg/dL (ref 8.9–10.3)
Chloride: 104 mmol/L (ref 98–111)
Creatinine, Ser: 1.21 mg/dL (ref 0.61–1.24)
GFR calc Af Amer: >60 mL/min (ref >60)
GFR calc non Af Amer: >60 mL/min (ref >60)
Glucose, Bld: 187 mg/dL — ABNORMAL HIGH (ref 70–99)
Potassium: 3.7 mmol/L (ref 3.5–5.1)
Sodium: 136 mmol/L (ref 135–145)

## 2018-11-11 LAB — LIPID PANEL
Cholesterol: 234 mg/dL — ABNORMAL HIGH (ref 0–200)
HDL: 36 mg/dL — ABNORMAL LOW (ref >40)
LDL Cholesterol: 171 mg/dL — ABNORMAL HIGH (ref 0–99)
Total CHOL/HDL Ratio: 6.5 RATIO
Triglycerides: 134 mg/dL (ref <150)
VLDL: 27 mg/dL (ref 0–40)

## 2018-11-11 LAB — GLUCOSE, CAPILLARY
Glucose-Capillary: 188 mg/dL — ABNORMAL HIGH (ref 70–99)
Glucose-Capillary: 148 mg/dL — ABNORMAL HIGH (ref 70–99)
Glucose-Capillary: 98 mg/dL (ref 70–99)
Glucose-Capillary: 103 mg/dL — ABNORMAL HIGH (ref 70–99)

## 2018-11-11 MED ORDER — HYDRALAZINE HCL 20 MG/ML IJ SOLN
10.0000 mg | INTRAMUSCULAR | Status: DC | PRN
  Administered 2018-11-11 – 2018-11-12 (×2): 20 mg via INTRAVENOUS
  Filled 2018-11-11 (×2): qty 1

## 2018-11-11 MED ORDER — PROMETHAZINE HCL 25 MG PO TABS
25.0000 mg | ORAL_TABLET | Freq: Four times a day (QID) | ORAL | Status: DC | PRN
  Administered 2018-11-12 (×2): 25 mg via ORAL
  Filled 2018-11-11 (×2): qty 1

## 2018-11-11 MED ORDER — CARVEDILOL 12.5 MG PO TABS
12.5000 mg | ORAL_TABLET | Freq: Two times a day (BID) | ORAL | Status: DC
  Administered 2018-11-11 – 2018-11-12 (×2): 12.5 mg via ORAL
  Filled 2018-11-11 (×2): qty 1

## 2018-11-11 MED ORDER — CHLORHEXIDINE GLUCONATE CLOTH 2 % EX PADS: 6.0000 | MEDICATED_PAD | Freq: Every day | CUTANEOUS | Status: DC

## 2018-11-11 MED ORDER — ATORVASTATIN CALCIUM 80 MG PO TABS
80.0000 mg | ORAL_TABLET | Freq: Every day | ORAL | Status: DC
  Administered 2018-11-11 – 2018-11-12 (×2): 80 mg via ORAL
  Filled 2018-11-11: qty 1

## 2018-11-11 MED ORDER — CARVEDILOL 6.25 MG PO TABS: 6.2500 mg | ORAL_TABLET | Freq: Two times a day (BID) | ORAL | Status: DC

## 2018-11-11 NOTE — Consult Note (Signed)
[facility] Nurse wound consult note Patient receiving care in [facility] 2H02.  Consult completed remotely via [place], conversation with patient, and review of record. Reason for Consult: Right stump BKA Wound type: MDRPI from his prosthetic Pressure Injury POA: Yes Measurement: To be provided by the bedside RN in the flowsheet section Wound bed: Two wounds are present.  The more lateral wound is linear with yellow slough in the wound bed.  The medial area is much small and has a red wound bed. Drainage (amount, consistency, odor) none observed Periwound: intact Dressing procedure/placement/frequency:  Wash the areas on the R stump with soap and water. Pat dry. Apply a hydrocolloid dressing Kellie Simmering [phone]). Change daily. The hydrocolloid will promote autolytic debridement and maintain a moist wound healing environment. Monitor the wound area(s) for worsening of condition such as: Signs/symptoms of infection,  Increase in size,  Development of or worsening of odor, Development of pain, or increased pain at the affected locations.  Notify the medical team if any of these develop.  Thank you for the consult.  Discussed plan of care with the patient and bedside nurse.  [facility] nurse will not follow at this time.  Please re-consult the [place] team if needed.  Val Riles, RN, MSN, CWOCN, CNS-BC, pager [phone]

## 2018-11-11 NOTE — Progress Notes (Signed)
Pt still has nausea. Holding PM PO medications for now. Will page cardiolgoy if no resolve.

## 2018-11-11 NOTE — Progress Notes (Signed)
Notified Dr. Irish Lack about pts increased need fro NTG gtt, now at 169mcg.  No PRNs available.  New orders to be placed for PRN BP control.

## 2018-11-11 NOTE — Progress Notes (Signed)
Progress Note  Patient Name: Keith [name] Date of Encounter: 11/11/2018  Primary Cardiologist: Larae Grooms, MD   Subjective   No further chest pain  Inpatient Medications    Scheduled Meds: . amLODipine  10 mg Oral Daily  . amoxicillin-clavulanate  1 tablet Oral Q12H  . aspirin EC  81 mg Oral Daily  . bictegravir-emtricitabine-tenofovir AF  1 tablet Oral Daily  . carvedilol  3.125 mg Oral BID WC  . Chlorhexidine Gluconate Cloth  6 each Topical Daily  . dapsone  100 mg Oral Daily  . escitalopram  20 mg Oral Daily  . heparin  5,000 Units Subcutaneous Q8H  . insulin aspart  0-9 Units Subcutaneous TID WC  . insulin glargine  30 Units Subcutaneous Daily  . oxybutynin  5 mg Oral BID  . sodium chloride flush  3 mL Intravenous Once  . sodium chloride flush  3 mL Intravenous Q12H  . sodium chloride flush  3 mL Intravenous Q12H  . ticagrelor  90 mg Oral Q12H   Continuous Infusions: . sodium chloride    . sodium chloride 125 mL/hr at 11/11/18 0700  . sodium chloride    . nitroGLYCERIN 50 mcg/min (11/11/18 0700)   PRN Meds: sodium chloride, sodium chloride, acetaminophen, nitroGLYCERIN, ondansetron (ZOFRAN) IV, oxyCODONE, sodium chloride flush, sodium chloride flush   Vital Signs    Vitals:   11/11/18 0645 11/11/18 0650 11/11/18 0700 11/11/18 0800  BP: (!) 140/97  129/84 (!) 136/94  Pulse: 64  (!) 58 76  Resp: 12  (!) 8 14  Temp:  97.9 F (36.6 C)    TempSrc:  Oral    SpO2: 100%  99% 98%  Weight:      Height:        Intake/Output Summary (Last 24 hours) at 11/11/2018 0928 Last data filed at 11/11/2018 0900 Gross per 24 hour  Intake 1485.25 ml  Output 1500 ml  Net -14.75 ml   Last 3 Weights 11/11/2018 11/10/2018 11/06/2018  Weight (lbs) 251 lb 1.7 oz 248 lb 246 lb  Weight (kg) 113.9 kg 112.492 kg 111.585 kg  Some encounter information is confidential and restricted. Go to Review Flowsheets activity to see all data.      Telemetry    NSR - Personally Reviewed   ECG    NSR, no ST changes - Personally Reviewed  Physical Exam   GEN: No acute distress.   Neck: No JVD Cardiac: RRR, no murmurs, rubs, or gallops.  Respiratory: Clear to auscultation bilaterally. GI: Soft, nontender, non-distended  MS: No edema; No deformity. Right radial site without hematoma. Neuro:  Nonfocal  Psych: Normal affect   Labs    High Sensitivity Troponin:   Recent Labs  Lab 11/10/18 1330 11/10/18 1537 11/10/18 2050  TROPONINIHS >27,000* >27,000* >27,000*      Cardiac EnzymesNo results for input(s): TROPONINI in the last 168 hours. No results for input(s): TROPIPOC in the last 168 hours.   Chemistry Recent Labs  Lab 11/10/18 1330 11/10/18 2050 11/11/18 0219  NA 137  --  136  K 3.9  --  3.7  CL 101  --  104  CO2 26  --  20*  GLUCOSE 223*  --  187*  BUN 23*  --  17  CREATININE 1.31* 1.21 1.21  CALCIUM 9.6  --  8.9  PROT  --  7.7  --   ALBUMIN  --  2.8*  --   AST  --  135*  --  ALT  --  32  --   ALKPHOS  --  79  --   BILITOT  --  0.7  --   GFRNONAA >60 >60 >60  GFRAA >60 >60 >60  ANIONGAP 10  --  12     Hematology Recent Labs  Lab 11/10/18 1330 11/10/18 2050 11/11/18 0219  WBC 7.6 8.4 6.9  RBC 4.14* 3.73* 3.94*  HGB 10.6* 9.6* 10.1*  HCT 34.8* 30.6* 32.7*  MCV 84.1 82.0 83.0  MCH 25.6* 25.7* 25.6*  MCHC 30.5 31.4 30.9  RDW 13.8 13.7 13.8  PLT 422* 380 379    BNPNo results for input(s): BNP, PROBNP in the last 168 hours.   DDimer No results for input(s): DDIMER in the last 168 hours.   Radiology    Dg Chest 2 View  Result Date: 11/10/2018 CLINICAL DATA:  53 year old male with chest pain since yesterday. EXAM: CHEST - 2 VIEW COMPARISON:  10/28/2018 portable chest and earlier. FINDINGS: PA and lateral views. Stable cardiomegaly and mediastinal contours. Visualized tracheal air column is within normal limits. Low normal lung volumes. Both lungs appear clear. No pneumothorax or pleural effusion. No acute osseous abnormality  identified. Negative visible bowel gas pattern. IMPRESSION: No acute cardiopulmonary abnormality. Electronically Signed   By: Genevie Ann M.D.   On: 11/10/2018 13:11    Cardiac Studies   Cath study reviewed  Patient Profile     53 y.o. male with NSTEMI due to OM1 disease- successfully treated with PCI  Assessment & Plan    1) CAD: DAPT, statin. Atorvastatin started.  Will have to see if there are interactions with HIV meds.   2) DM: WIll need better DM control.  OK to transfer to tele.  POssible discharge tomorrow depending on how he is feeling.   For questions or updates, please contact [place] Please consult [email or web address] for contact info under        Signed, Larae Grooms, MD  11/11/2018, 9:28 AM

## 2018-11-11 NOTE — Progress Notes (Signed)
Notified Dr. Nyra Market about inability to wean NTG gtt down.  Goal SBP now </= 150.  Will continue to monitor.

## 2018-11-12 LAB — CBC
HCT: 28.3 % — ABNORMAL LOW (ref 39.0–52.0)
Hemoglobin: 8.8 g/dL — ABNORMAL LOW (ref 13.0–17.0)
MCH: 25.5 pg — ABNORMAL LOW (ref 26.0–34.0)
MCHC: 31.1 g/dL (ref 30.0–36.0)
MCV: 82 fL (ref 80.0–100.0)
Platelets: 342 10*3/uL (ref 150–400)
RBC: 3.45 MIL/uL — ABNORMAL LOW (ref 4.22–5.81)
RDW: 13.9 % (ref 11.5–15.5)
WBC: 8.8 10*3/uL (ref 4.0–10.5)
nRBC: 0 % (ref 0.0–0.2)

## 2018-11-12 LAB — GLUCOSE, CAPILLARY
Glucose-Capillary: 110 mg/dL — ABNORMAL HIGH (ref 70–99)
Glucose-Capillary: 128 mg/dL — ABNORMAL HIGH (ref 70–99)
Glucose-Capillary: 166 mg/dL — ABNORMAL HIGH (ref 70–99)
Glucose-Capillary: 130 mg/dL — ABNORMAL HIGH (ref 70–99)

## 2018-11-12 MED ORDER — CARVEDILOL 25 MG PO TABS
25.0000 mg | ORAL_TABLET | Freq: Two times a day (BID) | ORAL | Status: DC
  Administered 2018-11-12 – 2018-11-13 (×2): 25 mg via ORAL
  Filled 2018-11-12 (×2): qty 1

## 2018-11-12 MED ORDER — LIVING WELL WITH DIABETES BOOK
Freq: Once | Status: DC
  Filled 2018-11-12: qty 1

## 2018-11-12 MED ORDER — TICAGRELOR 90 MG PO TABS: 90.0000 mg | ORAL_TABLET | Freq: Two times a day (BID) | ORAL | Status: DC

## 2018-11-12 NOTE — Progress Notes (Signed)
Per benefits check on Brilinta  #  PRIMARY INS: [employer] A [employer] B   SECONDARY INS : MEDICAID [place] ACCESS  EFF-DATE : 11-07-2018  CO-PAY- $ 3.90 FOR EACH PRESCRIPTION   BRILINTA 90 MG BID

## 2018-11-12 NOTE — Progress Notes (Signed)
Inpatient Diabetes Program Recommendations  AACE/ADA: New Consensus Statement on Inpatient Glycemic Control (2015)  Target Ranges:  Prepandial:   less than 140 mg/dL      Peak postprandial:   less than 180 mg/dL (1-2 hours)      Critically ill patients:  140 - 180 mg/dL   Lab Results  Component Value Date   GLUCAP 166 (H) 11/12/2018   HGBA1C >15.5 (H) 09/07/2018    Review of Glycemic Control  Inpatient Diabetes Program Recommendations:    Spoke with pt about A1C >15 results with them and explained what an A1C is, basic pathophysiology of DM Type 2, basic home care, basic diabetes diet nutrition principles, importance of checking CBGs and maintaining good CBG control to prevent long-term and short-term complications. Reviewed signs and symptoms of hyperglycemia and hypoglycemia and how to treat hypoglycemia at home. Also reviewed blood sugar goals at home.  RNs to provide ongoing basic DM education at bedside with this patient. Have ordered educational booklet and DM videos. Patient states he ran out of insulin but now has insulin supplies. Patient requested consult regarding medications and noted consult is still active.  Thank you, Nani Gasser. Herve Haug, RN, MSN, CDE  Diabetes Coordinator Inpatient Glycemic Control Team Team Pager [phone] (8am-5pm) 11/12/2018 5:20 PM

## 2018-11-12 NOTE — Progress Notes (Addendum)
Progress Note  Patient Name: Keith [name] Date of Encounter: 11/12/2018  Primary Cardiologist: Larae Grooms, MD   Subjective   Complaining of nausea, dry heaves, mild abd pain today.   Inpatient Medications    Scheduled Meds:  amLODipine  10 mg Oral Daily   amoxicillin-clavulanate  1 tablet Oral Q12H   aspirin EC  81 mg Oral Daily   atorvastatin  80 mg Oral q1800   bictegravir-emtricitabine-tenofovir AF  1 tablet Oral Daily   carvedilol  25 mg Oral BID WC   Chlorhexidine Gluconate Cloth  6 each Topical Daily   dapsone  100 mg Oral Daily   escitalopram  20 mg Oral Daily   heparin  5,000 Units Subcutaneous Q8H   insulin aspart  0-9 Units Subcutaneous TID WC   insulin glargine  30 Units Subcutaneous Daily   oxybutynin  5 mg Oral BID   sodium chloride flush  3 mL Intravenous Once   sodium chloride flush  3 mL Intravenous Q12H   ticagrelor  90 mg Oral Q12H   Continuous Infusions:  sodium chloride     nitroGLYCERIN Stopped (11/11/18 1910)   PRN Meds: sodium chloride, acetaminophen, hydrALAZINE, nitroGLYCERIN, ondansetron (ZOFRAN) IV, oxyCODONE, promethazine, sodium chloride flush   Vital Signs    Vitals:   11/12/18 0700 11/12/18 0734 11/12/18 0800 11/12/18 0806  BP: 131/80  (!) 150/95 (!) 150/95  Pulse: 82  82   Resp: 15  (!) 22   Temp:  98 F (36.7 C)    TempSrc:  Oral    SpO2: 98%  100%   Weight:      Height:        Intake/Output Summary (Last 24 hours) at 11/12/2018 1115 Last data filed at 11/12/2018 0810 Gross per 24 hour  Intake 2616.94 ml  Output 1725 ml  Net 891.94 ml   Last 3 Weights 11/12/2018 11/11/2018 11/10/2018  Weight (lbs) 249 lb 5.4 oz 251 lb 1.7 oz 248 lb  Weight (kg) 113.1 kg 113.9 kg 112.492 kg  Some encounter information is confidential and restricted. Go to Review Flowsheets activity to see all data.      Telemetry    SR - Personally Reviewed  ECG    N/a - Personally Reviewed  Physical Exam   GEN: No acute  distress.   Neck: No JVD Cardiac: RRR, no murmurs, rubs, or gallops.  Respiratory: Clear to auscultation bilaterally. GI: Soft, nontender, non-distended  MS: No edema; No deformity. Right BKA Neuro:  Nonfocal  Psych: Normal affect   Labs    High Sensitivity Troponin:   Recent Labs  Lab 11/10/18 1330 11/10/18 1537 11/10/18 2050  TROPONINIHS >27,000* >27,000* >27,000*      Cardiac EnzymesNo results for input(s): TROPONINI in the last 168 hours. No results for input(s): TROPIPOC in the last 168 hours.   Chemistry Recent Labs  Lab 11/10/18 1330 11/10/18 2050 11/11/18 0219  NA 137  --  136  K 3.9  --  3.7  CL 101  --  104  CO2 26  --  20*  GLUCOSE 223*  --  187*  BUN 23*  --  17  CREATININE 1.31* 1.21 1.21  CALCIUM 9.6  --  8.9  PROT  --  7.7  --   ALBUMIN  --  2.8*  --   AST  --  135*  --   ALT  --  32  --   ALKPHOS  --  79  --   BILITOT  --  0.7  --   GFRNONAA >60 >60 >60  GFRAA >60 >60 >60  ANIONGAP 10  --  12     Hematology Recent Labs  Lab 11/10/18 2050 11/11/18 0219 11/12/18 0315  WBC 8.4 6.9 8.8  RBC 3.73* 3.94* 3.45*  HGB 9.6* 10.1* 8.8*  HCT 30.6* 32.7* 28.3*  MCV 82.0 83.0 82.0  MCH 25.7* 25.6* 25.5*  MCHC 31.4 30.9 31.1  RDW 13.7 13.8 13.9  PLT 380 379 342    BNPNo results for input(s): BNP, PROBNP in the last 168 hours.   DDimer No results for input(s): DDIMER in the last 168 hours.   Radiology    Dg Chest 2 View  Result Date: 11/10/2018 CLINICAL DATA:  53 year old male with chest pain since yesterday. EXAM: CHEST - 2 VIEW COMPARISON:  10/28/2018 portable chest and earlier. FINDINGS: PA and lateral views. Stable cardiomegaly and mediastinal contours. Visualized tracheal air column is within normal limits. Low normal lung volumes. Both lungs appear clear. No pneumothorax or pleural effusion. No acute osseous abnormality identified. Negative visible bowel gas pattern. IMPRESSION: No acute cardiopulmonary abnormality. Electronically Signed    By: Genevie Ann M.D.   On: 11/10/2018 13:11    Cardiac Studies   TTE: 11/11/18   IMPRESSIONS    1. The left ventricle has normal systolic function, with an ejection fraction of 55-60%. The cavity size was normal. Left ventricular diastolic Doppler parameters are consistent with impaired relaxation.  2. The right ventricle has normal systolic function. The cavity was normal. There is no increase in right ventricular wall thickness.  3. No evidence of mitral valve stenosis.  4. The tricuspid valve is grossly normal.  5. No stenosis of the aortic valve.  6. The aorta is normal in size and structure.  7. There is mild dilatation of the ascending aorta.  8. The interatrial septum was not assessed.  Cath: 11/10/18   1st Mrg lesion is 90% stenosed.  A drug-eluting stent was successfully placed using a STENT RESOLUTE ONYX 2.25X15. -- 2.4 mm  Post intervention, there is a 0% residual stenosis.  --------------------------  Mid Cx-1 lesion is 55% stenosed. Mid Cx-2 lesion is 40% stenosed.  1st Diag lesion is 80% stenosed. 2nd Diag lesion is 80% stenosed.  Dist LAD-1 lesion is 40% stenosed. Dist (apical) LAD-2 lesion is 90% stenosed.  Dist RCA lesion is 50% stenosed.  --------------------------  LV end diastolic pressure is moderately elevated.   SUMMARY  Severe single vessel CAD - Ostial OM1 90% with apical LAD 90%, distal Cx 40% & dRCA 50% ? Successful DES PCI of Ost-Prox OM1 (Resolute Onyx DES 2.25 mm x 15 mm -- 2.4 mm)  Moderately elevated LVEDP ~21 mmHg  Very difficult to engage RCA due to High Anterior tack-eoff & tortuous Innominate Artery (multiple catheters used before AL1)   RECOMMENDATIONS  Admit to CCU for overnight monitoring. TR Band removal per protocol  Echocardiogram ordered (No LV Gram to conserve Contrast  Adding Carvedilol 3.125 mg BID & High dose Atorvastatin  Needs aggressive BP & DM management  DAPT x 1 year == Care Management Consult.  Glenetta Hew, M.D., M.S. Interventional Cardiologist   Diagnostic Dominance: Right  Intervention     Patient Profile     53 y.o. male with PMH of HIV, DM, Anemia, CKD stage II, HTN, and Right BKA who presented with chest pain. Went for urgent cath noted above with PCI/DES to OM.   Assessment & Plan    1. CAD:  s/p PCI/DESx1 to OM. Placed on DAPT with ASA/Brilinta for at least one year. HsT peaked at >27000. -- continue ASA, Brilinta, BB, statin ( will review with PharmD in regards to his HIV meds)  2. HTN: blood pressures were uncontrolled on admission. Reported being out of his home medications and didn't refill them. Now better controlled on BB and norvasc. Nitro weaned and stopped.   3. HL: LDL 171 on admission. Now on high dose statin.   4. DM: Hgb A1c was >15.5 back in June. This is being followed as an outpatient. On multiple agents prior to admission. Have discussed the need to continue these medications with close follow up.  -- will consult Diabetes coordinator   5. Anemia: Baseline appears around 10.5, dropped to 8.8 today. No reports of bleeding, BM yesterday was normal per his report.  -- follow CBC with need for DAPT  6. Nausea: Had several episodes yesterday. Had dry heaves with some mucus while I was in the room. Recently received meds. Feels like his new medications are causing this. Low suspicion Brilinta is culprit.   For questions or updates, please contact [place] Please consult [email or web address] for contact info under        Signed, Reino Bellis, NP  11/12/2018, 11:15 AM    I have examined the patient and reviewed assessment and plan and discussed with patient.  Agree with above as stated.   Nausea today.  Able to eat breakfast.  BP better controlled.  Recheck Hbg.  Needs DM control.  Consider adding Jardiance or Invokana given recent [place]

## 2018-11-12 NOTE — Progress Notes (Signed)
[id number] Brief ed done with pt as he stated he is nauseated. Stated he will read materials left at bedside. Stressed with pt several times that he must take his brilinta with the stent. Also discussed that he needs to get diabetes under control and take his insulin and meds. He stated he has been taking them. Wants to see case manager as he states he does not have glucometer. NP cardiology to order Diabetic coordinator to see and PT consult. Pt stated he uses his prosthesis but with lots of socks because of stump wound. Discussed carb counting and healthy food choices and left diets. Pt needs reinforcement. He stated he will read later. Has [place] CRP 2 referral. Pt not appropriate at this time with stump wound. Will let PT evaluate. Reviewed NTG use and Mi restrictions.  Graylon Good RN BSN 11/12/2018 11:52 AM

## 2018-11-12 NOTE — Progress Notes (Signed)
PT Cancellation Note  Patient Details Name: Keith [name] MRN: [id number] DOB: Apr 08, 1966   Cancelled Treatment:    Reason Eval/Treat Not Completed: Patient declined, no reason specified; patient reports wanting to wait to walk until after he eats due to nausea.  Encouraged him to ask RN staff to assist.  PT to attempt another day.  Spoke with pt about prosthesis and reports new better fitting prosthetic being fabricated at [employer].  PT to follow up.    Reginia Naas 11/12/2018, 4:57 PM  Magda Kiel, [place] [phone] 11/12/2018

## 2018-11-13 ENCOUNTER — Telehealth: Payer: Self-pay | Admitting: Physician Assistant

## 2018-11-13 DIAGNOSIS — I10 Essential (primary) hypertension: Secondary | ICD-10-CM

## 2018-11-13 LAB — CBC
HCT: 30.2 % — ABNORMAL LOW (ref 39.0–52.0)
Hemoglobin: 9.3 g/dL — ABNORMAL LOW (ref 13.0–17.0)
MCH: 25.8 pg — ABNORMAL LOW (ref 26.0–34.0)
MCHC: 30.8 g/dL (ref 30.0–36.0)
MCV: 83.9 fL (ref 80.0–100.0)
Platelets: 341 10*3/uL (ref 150–400)
RBC: 3.6 MIL/uL — ABNORMAL LOW (ref 4.22–5.81)
RDW: 14 % (ref 11.5–15.5)
WBC: 6.4 10*3/uL (ref 4.0–10.5)
nRBC: 0 % (ref 0.0–0.2)

## 2018-11-13 LAB — GLUCOSE, CAPILLARY
Glucose-Capillary: 104 mg/dL — ABNORMAL HIGH (ref 70–99)
Glucose-Capillary: 153 mg/dL — ABNORMAL HIGH (ref 70–99)

## 2018-11-13 MED ORDER — ASPIRIN 81 MG PO TBEC: 81.0000 mg | DELAYED_RELEASE_TABLET | Freq: Every day | ORAL | Status: DC

## 2018-11-13 MED ORDER — CARVEDILOL 25 MG PO TABS: 25.0000 mg | ORAL_TABLET | Freq: Two times a day (BID) | ORAL | 0 refills | Status: DC

## 2018-11-13 MED ORDER — ATORVASTATIN CALCIUM 80 MG PO TABS: 80.0000 mg | ORAL_TABLET | Freq: Every day | ORAL | 1 refills | Status: DC

## 2018-11-13 MED ORDER — TICAGRELOR 90 MG PO TABS: 90.0000 mg | ORAL_TABLET | Freq: Two times a day (BID) | ORAL | 2 refills | Status: DC

## 2018-11-13 MED ORDER — BLOOD GLUCOSE METER KIT: PACK | 0 refills | Status: DC

## 2018-11-13 MED ORDER — NITROGLYCERIN 0.4 MG SL SUBL: 0.4000 mg | SUBLINGUAL_TABLET | SUBLINGUAL | 2 refills | Status: DC | PRN

## 2018-11-13 NOTE — Evaluation (Signed)
Physical Therapy Evaluation Patient Details Name: Keith [name] MRN: [name] DOB: 05-May-1965 Today's Date: 11/13/2018   History of Present Illness  53 y.o. male with Uncontrolled DM (recent A1C >15.5), HTN, HIV, CKD stage II, chronic normocytic anemia, chronic pain, osteomyelitis, recent abscess/ulcer of R transtibial amputation requiring IV abx who is seen for late presentation of chest pain with marked troponin elevation.Admitted 11/10/18 for NSTEMI due to OM1 disease- successfully treated with PCI. R residual limb wound result of ill fitting prosthetic, pt working with [employer] on new prosthetic.   Clinical Impression  PTA pt living with mother in apartment with 2 steps to enter, independent with ambulation with R LE prosthetic or RW. Pt with decreased safety awareness throughout evaluation refusing gait belt and grip sock during ambulation and decreased understanding of why he should not be using prosthetic when possible to allow R residual limb wound to heal. Otherwise pt is independent for bed mobility, mod I for transfers and supervision for ambulation of 20 feet with RW and without prosthetic. PT recommending resumption of HHPT to continue strength and balance training.     Follow Up Recommendations Home health PT    Equipment Recommendations  None recommended by PT       Precautions / Restrictions Precautions Precautions: Fall Precaution Comments: R BKA, L lateral lower leg wound Restrictions Weight Bearing Restrictions: No      Mobility  Bed Mobility Overal bed mobility: Independent                Transfers Overall transfer level: Modified independent Equipment used: Rolling walker (2 wheeled)   Sit to Stand: Modified independent (Device/Increase time)         General transfer comment: mod I for use of RW to come to standing and steadying  Ambulation/Gait Ambulation/Gait assistance: Supervision Gait Distance (Feet): 20 Feet Assistive device: Rolling walker  (2 wheeled) Gait Pattern/deviations: Step-to pattern Gait velocity: slowed Gait velocity interpretation: <1.8 ft/sec, indicate of risk for recurrent falls General Gait Details: hopping on L LE no LOB with RW, PT limited distance due to pt unwillingness to don shoe or gripper sock on L LE or for use of gait belt        Balance Overall balance assessment: Modified Independent Sitting-balance support: Feet supported Sitting balance-Leahy Scale: Normal     Standing balance support: Bilateral upper extremity supported Standing balance-Leahy Scale: Good                               Pertinent Vitals/Pain Pain Assessment: No/denies pain    Home Living Family/patient expects to be discharged to:: Private residence Living Arrangements: Parent Available Help at Discharge: Family Type of Home: Apartment Home Access: Stairs to enter Entrance Stairs-Rails: Can reach both Entrance Stairs-Number of Steps: 2 Home Layout: One level Home Equipment: Environmental consultant - 2 wheels      Prior Function Level of Independence: Independent with assistive device(s)         Comments: used prosthetic but too big and now has wound on R residual limb, working with [employer] on new prosthetic     Hand Dominance        Extremity/Trunk Assessment   Upper Extremity Assessment Upper Extremity Assessment: Overall WFL for tasks assessed    Lower Extremity Assessment Lower Extremity Assessment: RLE deficits/detail RLE Deficits / Details: R BKA       Communication   Communication: No difficulties  Cognition Arousal/Alertness: Awake/alert Behavior  During Therapy: WFL for tasks assessed/performed Overall Cognitive Status: Within Functional Limits for tasks assessed                                 General Comments: Decreased safety awareness, pt refuses use of gait belt and grip socks for evaluation, and does not fully verbalize understanding of why donning prosthetic is  decreasing residual limb wound from healing      General Comments General comments (skin integrity, edema, etc.): Wound on R residual limb covered by hydrocolloid dressing. Pt reports wound nurse to come to change dressing daily. Pt reports he will don his prosthetic for going home and getting up steps into home but will then remove it to promote wound healing. Pt reports his new prosthetic will be available next week.    Exercises     Assessment/Plan    PT Assessment Patient needs continued PT services  PT Problem List Decreased safety awareness;Decreased knowledge of use of DME       PT Treatment Interventions DME instruction;Gait training;Stair training;Functional mobility training;Therapeutic activities;Therapeutic exercise;Balance training;Cognitive remediation;Patient/family education    PT Goals (Current goals can be found in the Care Plan section)  Acute Rehab PT Goals Patient Stated Goal: to walk independent PT Goal Formulation: With patient Time For Goal Achievement: 11/27/18 Potential to Achieve Goals: Good    Frequency Min 3X/week    AM-PAC PT "6 Clicks" Mobility  Outcome Measure Help needed turning from your back to your side while in a flat bed without using bedrails?: None Help needed moving from lying on your back to sitting on the side of a flat bed without using bedrails?: None Help needed moving to and from a bed to a chair (including a wheelchair)?: None Help needed standing up from a chair using your arms (e.g., wheelchair or bedside chair)?: None Help needed to walk in hospital room?: None Help needed climbing 3-5 steps with a railing? : A Little 6 Click Score: 23    End of Session   Activity Tolerance: Patient tolerated treatment well Patient left: in bed;with call bell/phone within reach Nurse Communication: Mobility status PT Visit Diagnosis: Other abnormalities of gait and mobility (R26.89)    Time: 1002-1020 PT Time Calculation (min) (ACUTE  ONLY): 18 min   Charges:   PT Evaluation $PT Eval Moderate Complexity: 1 Mod          Keith [name] B. Migdalia Dk PT, DPT Acute Rehabilitation Services Pager [phone] Office [phone]   [place] 11/13/2018, 10:33 AM

## 2018-11-13 NOTE — Telephone Encounter (Signed)
**Note De-Identified Keith [name] Obfuscation** The pt is being discharged from the hospital today. We will call him Monday 11/16/2018.

## 2018-11-13 NOTE — Telephone Encounter (Signed)
New Message     Pt has TOC app with Robbie Lis 11/26/18 @ 10:45am

## 2018-11-13 NOTE — Progress Notes (Signed)
CSW provided homeless resources and spoke with the patient about his options. CSW provided packet.   CSW will continue to follow.   Domenic Schwab, MSW, [place] Worker [facility]  [phone]

## 2018-11-13 NOTE — Care Management Important Message (Signed)
Important Message  Patient Details  Name: Keith [name] MRN: [name] Date of Birth: 1965-12-20   Medicare Important Message Given:  Yes     [name] 11/13/2018, 4:02 PM

## 2018-11-13 NOTE — Discharge Instructions (Signed)
Blood Glucose Monitoring, Adult °Monitoring your blood sugar (glucose) is an important part of managing your diabetes (diabetes mellitus). Blood glucose monitoring involves checking your blood glucose as often as directed and keeping a record (log) of your results over time. °Checking your blood glucose regularly and keeping a blood glucose log can: °· Help you and your health care provider adjust your diabetes management plan as needed, including your medicines or insulin. °· Help you understand how food, exercise, illnesses, and medicines affect your blood glucose. °· Let you know what your blood glucose is at any time. You can quickly find out if you have low blood glucose (hypoglycemia) or high blood glucose (hyperglycemia). °Your health care provider will set individualized treatment goals for you. Your goals will be based on your age, other medical conditions you have, and how you respond to diabetes treatment. Generally, the goal of treatment is to maintain the following blood glucose levels: °· Before meals (preprandial): 80-130 mg/dL (4.4-7.2 mmol/L). °· After meals (postprandial): below 180 mg/dL (10 mmol/L). °· A1c level: less than 7%. °Supplies needed: °· Blood glucose meter. °· Test strips for your meter. Each meter has its own strips. You must use the strips that came with your meter. °· A needle to prick your finger (lancet). Do not use a lancet more than one time. °· A device that holds the lancet (lancing device). °· A journal or log book to write down your results. °How to check your blood glucose ° °1. Wash your hands with soap and water. °2. Prick the side of your finger (not the tip) with the lancet. Use a different finger each time. °3. Gently rub the finger until a small drop of blood appears. °4. Follow instructions that come with your meter for inserting the test strip, applying blood to the strip, and using your blood glucose meter. °5. Write down your result and any notes. °Some meters  allow you to use areas of your body other than your finger (alternative sites) to test your blood. The most common alternative sites are: °· Forearm. °· Thigh. °· Palm of the hand. °If you think you may have hypoglycemia, or if you have a history of not knowing when your blood glucose is getting low (hypoglycemia unawareness), do not use alternative sites. Use your finger instead. Alternative sites may not be as accurate as the fingers, because blood flow is slower in these areas. This means that the result you get may be delayed, and it may be different from the result that you would get from your finger. °Follow these instructions at home: °Blood glucose log ° °· Every time you check your blood glucose, write down your result. Also write down any notes about things that may be affecting your blood glucose, such as your diet and exercise for the day. This information can help you and your health care provider: °? Look for patterns in your blood glucose over time. °? Adjust your diabetes management plan as needed. °· Check if your meter allows you to download your records to a computer. Most glucose meters store a record of glucose readings in the meter. °If you have type 1 diabetes: °· Check your blood glucose 2 or more times a day. °· Also check your blood glucose: °? Before every insulin injection. °? Before and after exercise. °? Before meals. °? 2 hours after a meal. °? Occasionally between 2:00 a.m. and 3:00 a.m., as directed. °? Before potentially dangerous tasks, like driving or using heavy machinery. °?   At bedtime.  You may need to check your blood glucose more often, up to 6-10 times a day, if you: ? Use an insulin pump. ? Need multiple daily injections (MDI). ? Have diabetes that is not well-controlled. ? Are ill. ? Have a history of severe hypoglycemia. ? Have hypoglycemia unawareness. If you have type 2 diabetes:  If you take insulin or other diabetes medicines, check your blood glucose 2 or  more times a day.  If you are on intensive insulin therapy, check your blood glucose 4 or more times a day. Occasionally, you may also need to check between 2:00 a.m. and 3:00 a.m., as directed.  Also check your blood glucose: ? Before and after exercise. ? Before potentially dangerous tasks, like driving or using heavy machinery.  You may need to check your blood glucose more often if: ? Your medicine is being adjusted. ? Your diabetes is not well-controlled. ? You are ill. General tips  Always keep your supplies with you.  If you have questions or need help, all blood glucose meters have a 24-hour "hotline" phone number that you can call. You may also contact your health care provider.  After you use a few boxes of test strips, adjust (calibrate) your blood glucose meter by following instructions that came with your meter. Contact a health care provider if:  Your blood glucose is at or above 240 mg/dL (13.3 mmol/L) for 2 days in a row.  You have been sick or have had a fever for 2 days or longer, and you are not getting better.  You have any of the following problems for more than 6 hours: ? You cannot eat or drink. ? You have nausea or vomiting. ? You have diarrhea. Get help right away if:  Your blood glucose is lower than 54 mg/dL (3 mmol/L).  You become confused or you have trouble thinking clearly.  You have difficulty breathing.  You have moderate or large ketone levels in your urine. Summary  Monitoring your blood sugar (glucose) is an important part of managing your diabetes (diabetes mellitus).  Blood glucose monitoring involves checking your blood glucose as often as directed and keeping a record (log) of your results over time.  Your health care provider will set individualized treatment goals for you. Your goals will be based on your age, other medical conditions you have, and how you respond to diabetes treatment.  Every time you check your blood glucose,  write down your result. Also write down any notes about things that may be affecting your blood glucose, such as your diet and exercise for the day. This information is not intended to replace advice given to you by your health care provider. Make sure you discuss any questions you have with your health care provider. Document Released: 03/28/2003 Document Revised: 01/16/2018 Document Reviewed: 09/04/2015 Elsevier Patient Education  2020 [employer].   Diabetes Mellitus and Exercise Exercising regularly is important for your overall health, especially when you have diabetes (diabetes mellitus). Exercising is not only about losing weight. It has many other health benefits, such as increasing muscle strength and bone density and reducing body fat and stress. This leads to improved fitness, flexibility, and endurance, all of which result in better overall health. Exercise has additional benefits for people with diabetes, including:  Reducing appetite.  Helping to lower and control blood glucose.  Lowering blood pressure.  Helping to control amounts of fatty substances (lipids) in the blood, such as cholesterol and triglycerides.  Helping the body to respond better to insulin (improving insulin sensitivity).  Reducing how much insulin the body needs.  Decreasing the risk for heart disease by: ? Lowering cholesterol and triglyceride levels. ? Increasing the levels of good cholesterol. ? Lowering blood glucose levels. What is my activity plan? Your health care provider or certified diabetes educator can help you make a plan for the type and frequency of exercise (activity plan) that works for you. Make sure that you:  Do at least 150 minutes of moderate-intensity or vigorous-intensity exercise each week. This could be brisk walking, biking, or water aerobics. ? Do stretching and strength exercises, such as yoga or weightlifting, at least 2 times a week. ? Spread out your activity over at  least 3 days of the week.  Get some form of physical activity every day. ? Do not go more than 2 days in a row without some kind of physical activity. ? Avoid being inactive for more than 30 minutes at a time. Take frequent breaks to walk or stretch.  Choose a type of exercise or activity that you enjoy, and set realistic goals.  Start slowly, and gradually increase the intensity of your exercise over time. What do I need to know about managing my diabetes?   Check your blood glucose before and after exercising. ? If your blood glucose is 240 mg/dL (13.3 mmol/L) or higher before you exercise, check your urine for ketones. If you have ketones in your urine, do not exercise until your blood glucose returns to normal. ? If your blood glucose is 100 mg/dL (5.6 mmol/L) or lower, eat a snack containing 15-20 grams of carbohydrate. Check your blood glucose 15 minutes after the snack to make sure that your level is above 100 mg/dL (5.6 mmol/L) before you start your exercise.  Know the symptoms of low blood glucose (hypoglycemia) and how to treat it. Your risk for hypoglycemia increases during and after exercise. Common symptoms of hypoglycemia can include: ? Hunger. ? Anxiety. ? Sweating and feeling clammy. ? Confusion. ? Dizziness or feeling light-headed. ? Increased heart rate or palpitations. ? Blurry vision. ? Tingling or numbness around the mouth, lips, or tongue. ? Tremors or shakes. ? Irritability.  Keep a rapid-acting carbohydrate snack available before, during, and after exercise to help prevent or treat hypoglycemia.  Avoid injecting insulin into areas of the body that are going to be exercised. For example, avoid injecting insulin into: ? The arms, when playing tennis. ? The legs, when jogging.  Keep records of your exercise habits. Doing this can help you and your health care provider adjust your diabetes management plan as needed. Write down: ? Food that you eat before and  after you exercise. ? Blood glucose levels before and after you exercise. ? The type and amount of exercise you have done. ? When your insulin is expected to peak, if you use insulin. Avoid exercising at times when your insulin is peaking.  When you start a new exercise or activity, work with your health care provider to make sure the activity is safe for you, and to adjust your insulin, medicines, or food intake as needed.  Drink plenty of water while you exercise to prevent dehydration or heat stroke. Drink enough fluid to keep your urine clear or pale yellow. Summary  Exercising regularly is important for your overall health, especially when you have diabetes (diabetes mellitus).  Exercising has many health benefits, such as increasing muscle strength and bone density and reducing  body fat and stress.  Your health care provider or certified diabetes educator can help you make a plan for the type and frequency of exercise (activity plan) that works for you.  When you start a new exercise or activity, work with your health care provider to make sure the activity is safe for you, and to adjust your insulin, medicines, or food intake as needed. This information is not intended to replace advice given to you by your health care provider. Make sure you discuss any questions you have with your health care provider. Document Released: 06/15/2003 Document Revised: 10/17/2016 Document Reviewed: 09/04/2015 Elsevier Patient Education  [place].   Hyperglycemia Hyperglycemia occurs when the level of sugar (glucose) in the blood is too high. Glucose is a type of sugar that provides the body's main source of energy. Certain hormones (insulin and glucagon) control the level of glucose in the blood. Insulin lowers blood glucose, and glucagon increases blood glucose. Hyperglycemia can result from having too little insulin in the bloodstream, or from the body not responding normally to  insulin. Hyperglycemia occurs most often in people who have diabetes (diabetes mellitus), but it can happen in people who do not have diabetes. It can develop quickly, and it can be life-threatening if it causes you to become severely dehydrated (diabetic ketoacidosis or hyperglycemic hyperosmolar state). Severe hyperglycemia is a medical emergency. What are the causes? If you have diabetes, hyperglycemia may be caused by:  Diabetes medicine.  Medicines that increase blood glucose or affect your diabetes control.  Not eating enough, or not eating often enough.  Changes in physical activity level.  Being sick or having an infection. If you have prediabetes or undiagnosed diabetes:  Hyperglycemia may be caused by those conditions. If you do not have diabetes, hyperglycemia may be caused by:  Certain medicines, including steroid medicines, beta-blockers, epinephrine, and thiazide diuretics.  Stress.  Serious illness.  Surgery.  Diseases of the pancreas.  Infection. What increases the risk? Hyperglycemia is more likely to develop in people who have risk factors for diabetes, such as:  Having a family member with diabetes.  Having a gene for type 1 diabetes that is passed from parent to child (inherited).  Living in an area with cold weather conditions.  Exposure to certain viruses.  Certain conditions in which the body's disease-fighting (immune) system attacks itself (autoimmune disorders).  Being overweight or obese.  Having an inactive (sedentary) lifestyle.  Having been diagnosed with insulin resistance.  Having a history of prediabetes, gestational diabetes, or polycystic ovarian syndrome (PCOS).  Being of American-Indian, African-American, Hispanic/Latino, or Asian/Pacific Islander descent. What are the signs or symptoms? Hyperglycemia may not cause any symptoms. If you do have symptoms, they may include early warning signs, such as:  Increased  thirst.  Hunger.  Feeling very tired.  Needing to urinate more often than usual.  Blurry vision. Other symptoms may develop if hyperglycemia gets worse, such as:  Dry mouth.  Loss of appetite.  Fruity-smelling breath.  Weakness.  Unexpected or rapid weight gain or weight loss.  Tingling or numbness in the hands or feet.  Headache.  Skin that does not quickly return to normal after being lightly pinched and released (poor skin turgor).  Abdominal pain.  Cuts or bruises that are slow to heal. How is this diagnosed? Hyperglycemia is diagnosed with a blood test to measure your blood glucose level. This blood test is usually done while you are having symptoms. Your health care provider may  also do a physical exam and review your medical history. You may have more tests to determine the cause of your hyperglycemia, such as:  A fasting blood glucose (FBG) test. You will not be allowed to eat (you will fast) for at least 8 hours before a blood sample is taken.  An A1c (hemoglobin A1c) blood test. This provides information about blood glucose control over the previous 2-3 months.  An oral glucose tolerance test (OGTT). This measures your blood glucose at two times: ? After fasting. This is your baseline blood glucose level. ? Two hours after drinking a beverage that contains glucose. How is this treated? Treatment depends on the cause of your hyperglycemia. Treatment may include:  Taking medicine to regulate your blood glucose levels. If you take insulin or other diabetes medicines, your medicine or dosage may be adjusted.  Lifestyle changes, such as exercising more, eating healthier foods, or losing weight.  Treating an illness or infection, if this caused your hyperglycemia.  Checking your blood glucose more often.  Stopping or reducing steroid medicines, if these caused your hyperglycemia. If your hyperglycemia becomes severe and it results in hyperglycemic hyperosmolar  state, you must be hospitalized and given IV fluids. Follow these instructions at home:  General instructions  Take over-the-counter and prescription medicines only as told by your health care provider.  Do not use any products that contain nicotine or tobacco, such as cigarettes and e-cigarettes. If you need help quitting, ask your health care provider.  Limit alcohol intake to no more than 1 drink per day for nonpregnant women and 2 drinks per day for men. One drink equals 12 oz of beer, 5 oz of wine, or 1 oz of hard liquor.  Learn to manage stress. If you need help with this, ask your health care provider.  Keep all follow-up visits as told by your health care provider. This is important. Eating and drinking   Maintain a healthy weight.  Exercise regularly, as directed by your health care provider.  Stay hydrated, especially when you exercise, get sick, or spend time in hot temperatures.  Eat healthy foods, such as: ? Lean proteins. ? Complex carbohydrates. ? Fresh fruits and vegetables. ? Low-fat dairy products. ? Healthy fats.  Drink enough fluid to keep your urine clear or pale yellow. If you have diabetes:  Make sure you know the symptoms of hyperglycemia.  Follow your diabetes management plan, as told by your health care provider. Make sure you: ? Take your insulin and medicines as directed. ? Follow your exercise plan. ? Follow your meal plan. Eat on time, and do not skip meals. ? Check your blood glucose as often as directed. Make sure to check your blood glucose before and after exercise. If you exercise longer or in a different way than usual, check your blood glucose more often. ? Follow your sick day plan whenever you cannot eat or drink normally. Make this plan in advance with your health care provider.  Share your diabetes management plan with people in your workplace, school, and household.  Check your urine for ketones when you are ill and as told by  your health care provider.  Carry a medical alert card or wear medical alert jewelry. Contact a health care provider if:  Your blood glucose is at or above 240 mg/dL (13.3 mmol/L) for 2 days in a row.  You have problems keeping your blood glucose in your target range.  You have frequent episodes of hyperglycemia. Get help  right away if:  You have difficulty breathing.  You have a change in how you think, feel, or act (mental status).  You have nausea or vomiting that does not go away. These symptoms may represent a serious problem that is an emergency. Do not wait to see if the symptoms will go away. Get medical help right away. Call your local emergency services (911 in the [place] Do not drive yourself to the hospital. Summary  Hyperglycemia occurs when the level of sugar (glucose) in the blood is too high.  Hyperglycemia is diagnosed with a blood test to measure your blood glucose level. This blood test is usually done while you are having symptoms. Your health care provider may also do a physical exam and review your medical history.  If you have diabetes, follow your diabetes management plan as told by your health care provider.  Contact your health care provider if you have problems keeping your blood glucose in your target range. This information is not intended to replace advice given to you by your health care provider. Make sure you discuss any questions you have with your health care provider. Document Released: 09/18/2000 Document Revised: 12/11/2015 Document Reviewed: 12/11/2015 Elsevier Patient Education  [place].   Type 2 Diabetes Mellitus, Self Care, Adult Caring for yourself after you have been diagnosed with type 2 diabetes (type 2 diabetes mellitus) means keeping your blood sugar (glucose) under control with a balance of:  Nutrition.  Exercise.  Lifestyle changes.  Medicines or insulin, if necessary.  Support from your team of health care  providers and others. The following information explains what you need to know to manage your diabetes at home. What are the risks? Having diabetes can put you at risk for other long-term (chronic) conditions, such as heart disease and kidney disease. Your health care provider may prescribe medicines to help prevent complications from diabetes. These medicines may include:  Aspirin.  Medicine to lower cholesterol.  Medicine to control blood pressure. How to monitor blood glucose   Check your blood glucose every day, as often as told by your health care provider.  Have your A1c (hemoglobin A1c) level checked two or more times a year, or as often as told by your health care provider. Your health care provider will set individualized treatment goals for you. Generally, the goal of treatment is to maintain the following blood glucose levels:  Before meals (preprandial): 80-130 mg/dL (4.4-7.2 mmol/L).  After meals (postprandial): below 180 mg/dL (10 mmol/L).  A1c level: less than 7%. How to manage hyperglycemia and hypoglycemia Hyperglycemia symptoms Hyperglycemia, also called high blood glucose, occurs when blood glucose is too high. Make sure you know the early signs of hyperglycemia, such as:  Increased thirst.  Hunger.  Feeling very tired.  Needing to urinate more often than usual.  Blurry vision. Hypoglycemia symptoms Hypoglycemia, also called low blood glucose, occurswith a blood glucose level at or below 70 mg/dL (3.9 mmol/L). The risk for hypoglycemia increases during or after exercise, during sleep, during illness, and when skipping meals or not eating for a long time (fasting). It is important to know the symptoms of hypoglycemia and treat it right away. Always have a 15-gram rapid-acting carbohydrate snack with you to treat low blood glucose. Family members and close friends should also know the symptoms and should understand how to treat hypoglycemia, in case you are not  able to treat yourself. Symptoms may include:  Hunger.  Anxiety.  Sweating and feeling clammy.  Confusion.  Dizziness or feeling light-headed.  Sleepiness.  Nausea.  Increased heart rate.  Headache.  Blurry vision.  Irritability.  A change in coordination.  Tingling or numbness around the mouth, lips, or tongue.  Restless sleep.  Fainting.  Seizure. Treating hypoglycemia If you are alert and able to swallow safely, follow the 15:15 rule:  Take 15 grams of a rapid-acting carbohydrate. Talk with your health care provider about how much you should take.  Rapid-acting options include: ? Glucose pills (take 15 grams). ? 6-8 pieces of hard candy. ? 4-6 oz (120-150 mL) of fruit juice. ? 4-6 oz (120-150 mL) of regular (not diet) soda. ? 1 Tbsp (15 mL) honey or sugar.  Check your blood glucose 15 minutes after you take the carbohydrate.  If the repeat blood glucose level is still at or below 70 mg/dL (3.9 mmol/L), take 15 grams of a carbohydrate again.  If your blood glucose level does not increase above 70 mg/dL (3.9 mmol/L) after 3 tries, seek emergency medical care.  After your blood glucose level returns to normal, eat a meal or a snack within 1 hour. Treating severe hypoglycemia Severe hypoglycemia is when your blood glucose level is at or below 54 mg/dL (3 mmol/L). Severe hypoglycemia is an emergency. Do not wait to see if the symptoms will go away. Get medical help right away. Call your local emergency services (911 in the [place] If you have severe hypoglycemia and you cannot eat or drink, you may need an injection of glucagon. A family member or close friend should learn how to check your blood glucose and how to give you a glucagon injection. Ask your health care provider if you need to have an emergency glucagon injection kit available. Severe hypoglycemia may need to be treated in a hospital. The treatment may include getting glucose through an IV. You may  also need treatment for the cause of your hypoglycemia. Follow these instructions at home: Take diabetes medicines as told  If your health care provider prescribed insulin or diabetes medicines, take them every day.  Do not run out of insulin or other diabetes medicines that you take. Plan ahead so you always have these available.  If you use insulin, adjust your dosage based on how physically active you are and what foods you eat. Your health care provider will tell you how to adjust your dosage. Make healthy food choices  The things that you eat and drink affect your blood glucose and your insulin dosage. Making good choices helps to control your diabetes and prevent other health problems. A healthy meal plan includes eating lean proteins, complex carbohydrates, fresh fruits and vegetables, low-fat dairy products, and healthy fats. Make an appointment to see a diet and nutrition specialist (registered dietitian) to help you create an eating plan that is right for you. Make sure that you:  Follow instructions from your health care provider about eating or drinking restrictions.  Drink enough fluid to keep your urine pale yellow.  Keep a record of the carbohydrates that you eat. Do this by reading food labels and learning the standard serving sizes of foods.  Follow your sick day plan whenever you cannot eat or drink as usual. Make this plan in advance with your health care provider.  Stay active Exercise regularly, as told by your health care provider. This may include:  Stretching and doing strength exercises, such as yoga or weightlifting, 2 or more times a week.  Doing 150 minutes or more of  moderate-intensity or vigorous-intensity exercise each week. This could be brisk walking, biking, or water aerobics. ? Spread out your activity over 3 or more days of the week. ? Do not go more than 2 days in a row without doing some kind of physical activity. When you start a new exercise or  activity, work with your health care provider to adjust your insulin, medicines, or food intake as needed. Make healthy lifestyle choices  Do not use any tobacco products, such as cigarettes, chewing tobacco, and e-cigarettes. If you need help quitting, ask your health care provider.  If your health care provider says that alcohol is safe for you, limit alcohol intake to no more than 1 drink per day for nonpregnant women and 2 drinks per day for men. One drink equals 12 oz of beer (355 mL), 5 oz of wine (148 mL), or 1 oz of hard liquor (44 mL).  Learn to manage stress. If you need help with this, ask your health care provider. Care for your body   Keep your immunizations up to date. In addition to getting vaccinations as told by your health care provider, it is recommended that you get vaccinated against the following illnesses: ? The flu (influenza). Get a flu shot every year. ? Pneumonia. ? Hepatitis B.  Schedule an eye exam soon after your diagnosis, and then one time every year after that.  Check your skin and feet every day for cuts, bruises, redness, blisters, or sores. Schedule a foot exam with your health care provider once every year.  Brush your teeth and gums two times a day, and floss one or more times a day. Visit your dentist one or more times every 6 months.  Maintain a healthy weight. General instructions  Take over-the-counter and prescription medicines only as told by your health care provider.  Share your diabetes management plan with people in your workplace, school, and household.  Carry a medical alert card or wear medical alert jewelry.  Keep all follow-up visits as told by your health care provider. This is important. Questions to ask your health care provider  Do I need to meet with a diabetes educator?  Where can I find a support group for people with diabetes? Where to find more information For more information about diabetes, visit:  American  Diabetes Association (ADA): www.diabetes.org  American Association of Diabetes Educators (AADE): www.diabeteseducator.org Summary  Caring for yourself after you have been diagnosed with (type 2 diabetes mellitus) means keeping your blood sugar (glucose) under control with a balance of nutrition, exercise, lifestyle changes, and medicine.  Check your blood glucose every day, as often as told by your health care provider.  Having diabetes can put you at risk for other long-term (chronic) conditions, such as heart disease and kidney disease. Your health care provider may prescribe medicines to help prevent complications from diabetes.  Keep all follow-up visits as told by your health care provider. This is important. This information is not intended to replace advice given to you by your health care provider. Make sure you discuss any questions you have with your health care provider. Document Released: 07/17/2015 Document Revised: 09/15/2017 Document Reviewed: 04/28/2015 Elsevier Patient Education  2020 [employer].

## 2018-11-13 NOTE — Plan of Care (Signed)
  Problem: Cardiovascular: Goal: Vascular access site(s) Level 0-1 will be maintained Outcome: Progressing   Problem: Clinical Measurements: Goal: Will remain free from infection Outcome: Progressing

## 2018-11-13 NOTE — Discharge Summary (Addendum)
Discharge Summary    [name],  MRN: [id number], DOB/AGE: 06/24/1965 53 y.o.  Admit date: 11/10/2018 Discharge date: 11/13/2018  Primary Care Provider: System, Pcp Not In Primary Cardiologist: Larae Grooms, MD   Discharge Diagnoses    Principal Problem:   Acute myocardial infarction [facility]) Active Problems:   Essential hypertension   Hyperlipidemia   HIV disease ([place])   Type II diabetes mellitus with renal manifestations ([place])   Amputation stump infection ([place])   CKD (chronic kidney disease), stage II   Pressure injury of skin   Allergies Allergies  Allergen Reactions   Sulfur Itching    Patient stated he's allergic to "sulfur" AND "sulfa"   Sulfa Antibiotics Itching    Diagnostic Studies/Procedures    Cath: 11/10/18  SUMMARY  Severe single vessel CAD - Ostial OM1 90% with apical LAD 90%, distal Cx 40% & dRCA 50% ? Successful DES PCI of Ost-Prox OM1 (Resolute Onyx DES 2.25 mm x 15 mm -- 2.4 mm)  Moderately elevated LVEDP ~21 mmHg  Very difficult to engage RCA due to High Anterior tack-eoff & tortuous Innominate Artery (multiple catheters used before AL1)   RECOMMENDATIONS  Admit to CCU for overnight monitoring. TR Band removal per protocol  Echocardiogram ordered (No LV Gram to conserve Contrast  Adding Carvedilol 3.125 mg BID & High dose Atorvastatin  Needs aggressive BP & DM management  DAPT x 1 year == Care Management Consult.  Glenetta Hew, M.D., M.S.  Diagnostic Dominance: Right  Intervention    TTE: 11/11/18  IMPRESSIONS    1. The left ventricle has normal systolic function, with an ejection fraction of 55-60%. The cavity size was normal. Left ventricular diastolic Doppler parameters are consistent with impaired relaxation.  2. The right ventricle has normal systolic function. The cavity was normal. There is no increase in right ventricular wall thickness.  3. No evidence of mitral valve stenosis.  4. The  tricuspid valve is grossly normal.  5. No stenosis of the aortic valve.  6. The aorta is normal in size and structure.  7. There is mild dilatation of the ascending aorta.  8. The interatrial septum was not assessed. _____________   History of Present Illness     53 y.o. male with Uncontrolled DM (recent A1C >15.5), HTN, HIV, CKD stage II, chronic normocytic anemia, chronic pain, osteomyelitis, recent abscess/ulcer of R transtibial amputation requiring IV abx who was seen for late presentation of chest pain with marked troponin elevation.  Keith [name] had no prior cardiac hx but had echo/TEE in 2019 for bacteremia showing normal EF, grade 1 DD. He was recently admitted with abscess/ulcer of stump requiring IV antibiotics in the context of severely uncontrolled DM as well as uncontrolled HTN. He went home on transition to oral antibiotics. He feelt he had been improving from this.  However, the night prior to admission around 6pm he developed substernal chest pain that was somewhat sharp in nature but very severe. He had associated vomiting and dyspnea. He thought he ought to go to the ER but opted to try and wait it out. He had a restless night and did not sleep at all. The following afternoon when pain persisted he drove himself to the ER. He received '324mg'$  ASA And 1 SL NTG. Initial BP 138/93, f/u BP 192/121, then 168/109. EKG shows NSR with LVH, otherwise unremarkable. Labs show Cr 1.31 and 10.6 (similar to prior), hsTroponin >27,000. He still reported ongoing 8/10 chest pain although  stated he did not feel as bad as the night prior. ED  ordered heparin bolus. EKG was nonacute.  Given his symptoms, and concern for late presenting MI, he was taken urgently for a cardiac cath.   Hospital Course      1. NSTEMI - likely late presenting, with hsTroponin peaked at >27000 x3. Underwent cardiac cath noted above with PCI/DES x1 to OM. Residual disease in the apical LAD, dRCA and diag branches planned for  medical therapy. Placed on DAPT with ASA/Brilinta to continue for at least one year. No residual chest pain. Educated via cardiac rehab.    2. Uncontrolled HTN - Blood pressures were controlled while in the ED. He was started on coreg and titrated to '25mg'$  BID with the addition to amlodipine. Consider adding ACE/ARB as an outpatient if remains compliant with meds.   3. Diabetes mellitus, uncontrolled - Hgb A1c 15.5 back in June. Followed by PCP as an outpatient. Reported having his home supplies but not taking regularly. Educated by the diabetes coordinator. Give a Rx for a new meter as well. Stressed importance of insulin compliance and close follow up. Could consider adding Jardiance as an outpatient.   4. 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. Completed course of oral antibiotics while admitted. Plans for follow up next week. Being fitted for a new prosthetic.    5. CKD stage II - remained stable throughout admission.  6. Chronic appearing anemia - mild drop to 8.8 but suspect dilution. Stable at 9.3 at discharge.   Seen by CM and SW during admission to assist with medications and living arrangements at the time of discharge.   General: Well developed, well nourished, male appearing in no acute distress. Head: Normocephalic, atraumatic.  Neck: Supple, no JVD. Lungs:  Resp regular and unlabored, CTA. Heart: RRR, S1, S2, no murmur; no rub. Abdomen: Soft, non-tender, non-distended with normoactive bowel sounds. Extremities: No clubbing, cyanosis,edema. Right BKA Neuro: Alert and oriented X 3. Moves all extremities spontaneously. Psych: Normal affect.  Keith [name] was seen by Dr. Irish Lack and determined stable for discharge home. Follow up in the office has been arranged. Medications are listed below.   _____________  Discharge Vitals Blood pressure (!) 146/94, pulse 64, temperature 98.4 F (36.9 C), temperature source Oral, resp. rate 18, height '5\' 10"'$  (1.778 m),  weight 114.2 kg, SpO2 100 %.  Filed Weights   11/11/18 0256 11/12/18 0500 11/13/18 0638  Weight: 113.9 kg 113.1 kg 114.2 kg    Labs & Radiologic Studies    CBC Recent Labs    11/12/18 0315 11/13/18 0514  WBC 8.8 6.4  HGB 8.8* 9.3*  HCT 28.3* 30.2*  MCV 82.0 83.9  PLT 342 161   Basic Metabolic Panel Recent Labs    11/10/18 1330 11/10/18 2050 11/11/18 0219  NA 137  --  136  K 3.9  --  3.7  CL 101  --  104  CO2 26  --  20*  GLUCOSE 223*  --  187*  BUN 23*  --  17  CREATININE 1.31* 1.21 1.21  CALCIUM 9.6  --  8.9   Liver Function Tests Recent Labs    11/10/18 2050  AST 135*  ALT 32  ALKPHOS 79  BILITOT 0.7  PROT 7.7  ALBUMIN 2.8*   No results for input(s): LIPASE, AMYLASE in the last 72 hours. Cardiac Enzymes No results for input(s): CKTOTAL, CKMB, CKMBINDEX, TROPONINI in the last 72 hours. BNP Invalid input(s):  POCBNP D-Dimer No results for input(s): DDIMER in the last 72 hours. Hemoglobin A1C No results for input(s): HGBA1C in the last 72 hours. Fasting Lipid Panel Recent Labs    11/11/18 0219  CHOL 234*  HDL 36*  LDLCALC 171*  TRIG 134  CHOLHDL 6.5   Thyroid Function Tests Recent Labs    11/10/18 2050  TSH 1.633   _____________  Dg Chest 2 View  Result Date: 11/10/2018 CLINICAL DATA:  53 year old male with chest pain since yesterday. EXAM: CHEST - 2 VIEW COMPARISON:  10/28/2018 portable chest and earlier. FINDINGS: PA and lateral views. Stable cardiomegaly and mediastinal contours. Visualized tracheal air column is within normal limits. Low normal lung volumes. Both lungs appear clear. No pneumothorax or pleural effusion. No acute osseous abnormality identified. Negative visible bowel gas pattern. IMPRESSION: No acute cardiopulmonary abnormality. Electronically Signed   By: Genevie Ann M.D.   On: 11/10/2018 13:11   US Renal  Result Date: 10/22/2018 CLINICAL DATA:  Acute superimposed upon chronic kidney disease. EXAM: RENAL / URINARY TRACT  ULTRASOUND COMPLETE COMPARISON:  09/28/2016. FINDINGS: Right Kidney: Renal measurements: Approximately 13.6 x 5.2 x 5.9 cm = volume: 218 mL . No hydronephrosis. Well-preserved cortex. No shadowing calculi. Borderline to mild increased parenchymal echotexture. No focal parenchymal abnormality. Left Kidney: Renal measurements: Approximately 13.3 x 6.2 x 6.0 cm = volume: 259 mL. No hydronephrosis. Well-preserved cortex. No shadowing calculi. Borderline to mild increased parenchymal echotexture. No focal parenchymal abnormality. Bladder: Normal for degree of bladder distension. BILATERAL ureteral jets visualized at color Doppler evaluation. No interval change since the prior ultrasound in June, 2018. IMPRESSION: 1. Borderline mild increased parenchymal echotexture involving both kidneys as can be seen in patients with medical renal disease. 2. Otherwise normal examination and no interval change since June, 2018. Electronically Signed   By: Evangeline Dakin M.D.   On: 10/22/2018 08:07   Dg Chest Port 1 View  Result Date: 10/28/2018 CLINICAL DATA:  Cough EXAM: PORTABLE CHEST 1 VIEW COMPARISON:  10/25/2018 FINDINGS: Study is an exception folder currently. There is no edema, consolidation, effusion, or pneumothorax. Normal heart size and stable aortic tortuosity. IMPRESSION: Stable exam.  No acute finding. Electronically Signed   By: Monte Fantasia M.D.   On: 10/28/2018 05:41   Dg Chest Port 1 View  Result Date: 10/25/2018 CLINICAL DATA:  Headache and cough for 2 weeks. EXAM: PORTABLE CHEST 1 VIEW COMPARISON:  Chest x-rays dated 10/11/2018 and 01/26/2018. FINDINGS: Heart size is upper normal, stable. Overall cardiomediastinal silhouette is stable. Lungs are clear. No pleural effusion or pneumothorax seen. Osseous structures about the chest are unremarkable. IMPRESSION: No active disease. No evidence of pneumonia or pulmonary edema. Electronically Signed   By: Franki Cabot M.D.   On: 10/25/2018 06:42   Dg Finger  Middle Left  Result Date: 10/25/2018 CLINICAL DATA:  Left middle finger wound. EXAM: LEFT MIDDLE FINGER 2+V COMPARISON:  None. FINDINGS: There is no evidence of fracture or dislocation. There is no evidence of arthropathy or other focal bone abnormality. No lytic destruction is noted. Diffuse soft tissue swelling is noted concerning for infection. IMPRESSION: No fracture or dislocation is noted. Diffuse soft tissue swelling of the left middle finger is noted concerning for infection. Electronically Signed   By: Marijo Conception M.D.   On: 10/25/2018 08:28   Disposition   Pt is being discharged home today in good condition.  Follow-up Plans & Appointments    Follow-up Information    [place], [place], [place]  Follow up on 11/26/2018.   Specialty: Cardiology Why: at 10:45am for your follow up appt.  Contact information: [place] [address] [phone]          Discharge Instructions    AMB Referral to Cardiac Rehabilitation - Phase II   Complete by: As directed    Diagnosis:  NSTEMI Coronary Stents     After initial evaluation and assessments completed: Virtual Based Care may be provided alone or in conjunction with Phase 2 Cardiac Rehab based on patient barriers.: Yes   Call MD for:  redness, tenderness, or signs of infection (pain, swelling, redness, odor or green/yellow discharge around incision site)   Complete by: As directed    Diet - low sodium heart healthy   Complete by: As directed    Discharge instructions   Complete by: As directed    Radial Site Care Refer to this sheet in the next few weeks. These instructions provide you with information on caring for yourself after your procedure. Your caregiver may also give you more specific instructions. Your treatment has been planned according to current medical practices, but problems sometimes occur. Call your caregiver if you have any problems or questions after your procedure. HOME CARE  INSTRUCTIONS You may shower the day after the procedure.Remove the bandage (dressing) and gently wash the site with plain soap and water.Gently pat the site dry.  Do not apply powder or lotion to the site.  Do not submerge the affected site in water for 3 to 5 days.  Inspect the site at least twice daily.  Do not flex or bend the affected arm for 24 hours.  No lifting over 5 pounds (2.3 kg) for 5 days after your procedure.  Do not drive home if you are discharged the same day of the procedure. Have someone else drive you.  You may drive 24 hours after the procedure unless otherwise instructed by your caregiver.  What to expect: Any bruising will usually fade within 1 to 2 weeks.  Blood that collects in the tissue (hematoma) may be painful to the touch. It should usually decrease in size and tenderness within 1 to 2 weeks.  SEEK IMMEDIATE MEDICAL CARE IF: You have unusual pain at the radial site.  You have redness, warmth, swelling, or pain at the radial site.  You have drainage (other than a small amount of blood on the dressing).  You have chills.  You have a fever or persistent symptoms for more than 72 hours.  You have a fever and your symptoms suddenly get worse.  Your arm becomes pale, cool, tingly, or numb.  You have heavy bleeding from the site. Hold pressure on the site.   PLEASE DO NOT MISS ANY DOSES OF YOUR BRILINTA!!!!! Also keep a log of you blood pressures and bring back to your follow up appt. Please call the office with any questions.   Patients taking blood thinners should generally stay away from medicines like ibuprofen, Advil, Motrin, naproxen, and Aleve due to risk of stomach bleeding. You may take Tylenol as directed or talk to your primary doctor about alternatives.   Face-to-face encounter (required for Medicare/Medicaid patients)   Complete by: As directed    I Reino Bellis certify that this patient is under my care and that I, or a nurse practitioner or  physician's assistant working with me, had a face-to-face encounter that meets the physician face-to-face encounter requirements with this patient on 11/13/2018. The encounter with  the patient was in whole, or in part for the following medical condition(s) which is the primary reason for home health care (List medical condition): CAD, BKA, DM, HTN   The encounter with the patient was in whole, or in part, for the following medical condition, which is the primary reason for home health care: CAD, BKA   I certify that, based on my findings, the following services are medically necessary home health services: Physical therapy   Reason for Medically Necessary Home Health Services: Therapy- Personnel officer, Public librarian   My clinical findings support the need for the above services: Unable to leave home safely without assistance and/or assistive device   Further, I certify that my clinical findings support that this patient is homebound due to: Ambulates short distances less than 300 feet   Home Health   Complete by: As directed    To provide the following care/treatments: PT   Increase activity slowly   Complete by: As directed        Discharge Medications     Medication List    STOP taking these medications   acetaminophen 325 MG tablet Commonly known as: Tylenol   dapsone 100 MG tablet   hydrALAZINE 10 MG tablet Commonly known as: APRESOLINE     TAKE these medications   amLODipine 10 MG tablet Commonly known as: NORVASC Take 1 tablet (10 mg total) by mouth daily.   amoxicillin-clavulanate 875-125 MG tablet Commonly known as: AUGMENTIN Take 1 tablet by mouth every 12 (twelve) hours for 10 days.   aspirin 81 MG EC tablet Take 1 tablet (81 mg total) by mouth daily.   atorvastatin 80 MG tablet Commonly known as: LIPITOR Take 1 tablet (80 mg total) by mouth daily at 6 PM.   bictegravir-emtricitabine-tenofovir AF 50-200-25 MG Tabs tablet Commonly known as:  BIKTARVY Take 1 tablet by mouth daily.   blood glucose meter kit and supplies Dispense based on patient and insurance preference. Use up to four times daily as directed. (FOR ICD-10 E10.9, E11.9).   carvedilol 25 MG tablet Commonly known as: COREG Take 1 tablet (25 mg total) by mouth 2 (two) times daily with a meal.   collagenase ointment Commonly known as: SANTYL Apply topically 2 (two) times daily at 10 AM and 5 PM.   Easy Comfort Pen Needles 31G X 5 MM Misc Generic drug: Insulin Pen Needle INJECT 20 UNITS DAILY AS DIRECTED. What changed: See the new instructions.   escitalopram 20 MG tablet Commonly known as: LEXAPRO Take 20 mg by mouth daily.   glucose blood test strip Commonly known as: Cool Blood Glucose Test Strips Use as instructed   Insulin Glargine 100 UNIT/ML Solostar Pen Commonly known as: Lantus SoloStar Inject 30 Units into the skin daily.   insulin lispro 100 UNIT/ML injection Commonly known as: HUMALOG Inject 10 units under skin 3 times a day, with meals, for blood glucose levels greater than 125.   Insulin Syringes (Disposable) U-100 0.3 ML Misc 1 Syringe by Does not apply route 3 (three) times daily.   nitroGLYCERIN 0.4 MG SL tablet Commonly known as: NITROSTAT Place 1 tablet (0.4 mg total) under the tongue every 5 (five) minutes x 3 doses as needed for chest pain.   onetouch ultrasoft lancets Use as instructed   oxybutynin 5 MG tablet Commonly known as: DITROPAN TAKE 1 TABLET (5 MG TOTAL) BY MOUTH 2 (TWO) TIMES DAILY.   Oxycodone HCl 10 MG Tabs Take 1 tablet (10 mg  total) by mouth daily as needed for up to 5 doses for severe pain or breakthrough pain (pain).   silver sulfADIAZINE 1 % cream Commonly known as: Silvadene Apply 1 application topically daily. Apply to wound daily   ticagrelor 90 MG Tabs tablet Commonly known as: BRILINTA Take 1 tablet (90 mg total) by mouth every 12 (twelve) hours.        Acute coronary syndrome (MI,  NSTEMI, STEMI, etc) this admission?: Yes.     AHA/ACC Clinical Performance & Quality Measures: 1. Aspirin prescribed? - Yes 2. ADP Receptor Inhibitor (Plavix/Clopidogrel, Brilinta/Ticagrelor or Effient/Prasugrel) prescribed (includes medically managed patients)? - Yes 3. Beta Blocker prescribed? - Yes 4. High Intensity Statin (Lipitor 40-'80mg'$  or Crestor 20-'40mg'$ ) prescribed? - Yes 5. EF assessed during THIS hospitalization? - Yes 6. For EF <40%, was ACEI/ARB prescribed? - Not Applicable (EF >/= 23%) 7. For EF <40%, Aldosterone Antagonist (Spironolactone or Eplerenone) prescribed? - Not Applicable (EF >/= 76%) 8. Cardiac Rehab Phase II ordered (Included Medically managed Patients)? - Yes      Outstanding Labs/Studies   FLP/LFTs in 6 weeks.   Duration of Discharge Encounter   Greater than 30 minutes including physician time.  Signed, Reino Bellis NP-C 11/13/2018, 10:41 AM   I have examined the patient and reviewed assessment and plan and discussed with patient.  Agree with above as stated.   GEN: Well nourished, well developed, in no acute distress  HEENT: normal  Neck: no JVD, carotid bruits, or masses Cardiac: RRR; no murmurs, rubs, or gallops,no edema  Respiratory:  clear to auscultation bilaterally, normal work of breathing GI: soft, nontender, nondistended,  MS: no deformity or atrophy  Skin: warm and dry, no rash Neuro:  Strength and sensation are intact Psych: euthymic mood, full affect  Doing well post MI.  I stressed the importance of DM control and DAPT for his new stent.  Improve diet and exercise as well. Weight loss will beneficial.   Consider adding Invokana for DM.  Meds have been checked with Pharmacy regarding interactions with HIV meds.   Larae Grooms

## 2018-11-13 NOTE — TOC Initial Note (Signed)
Transition of Care [facility]) - Initial/Assessment Note    Patient Details  Name: Keith [name] MRN: [id number] Date of Birth: 1966/01/21  Transition of Care [facility]) CM/SW Contact:    Bethena Roys, RN Phone Number: 11/13/2018, 12:34 PM  Clinical Narrative:  Pt presented for Chest Pain- Previous admission for infectious stump. PTA from home with mother and will return to her home. Pt is currently active with [facility] to begin within 24-48 hours post transition. CM stressed the importance of going to the scheduled appointment with PCP. Patient was given a bus ticket to get home. No further needs from CM at this time.                Expected Discharge Plan: [place] Barriers to Discharge: No Barriers Identified   Patient Goals and CMS Choice Patient states their goals for this hospitalization and ongoing recovery are:: to return to his mothers home with [facility] Services. CMS Medicare.gov Compare Post Acute Care list provided to:: Patient Choice offered to / list presented to : Patient  Expected Discharge Plan and Services Expected Discharge Plan: [place] In-house Referral: NA Discharge Planning Services: CM Consult, Follow-up appt scheduled, [place] Acute Care Choice: [place] arrangements for the past 2 months: Single Family Home Expected Discharge Date: 11/13/18                         HH Arranged: RN, Disease Management, PT [place] Agency: [place] Date [facility] Agency Contacted: 11/13/18 Time [place]: 1232 Representative spoke with at [place]: Tommi Rumps  Prior Living Arrangements/Services Living arrangements for the past 2 months: [place] Lives with:: Parents Patient language and need for interpreter reviewed:: Yes Do you feel safe going back to the place where you live?: Yes      Need for Family Participation in Patient Care: Yes  (Comment) Care giver support system in place?: Yes (comment)   Criminal Activity/Legal Involvement Pertinent to Current Situation/Hospitalization: No - Comment as needed  Activities of Daily Living Home Assistive Devices/Equipment: CBG Meter, Prosthesis ADL Screening (condition at time of admission) Patient's cognitive ability adequate to safely complete daily activities?: Yes Is the patient deaf or have difficulty hearing?: No Does the patient have difficulty seeing, even when wearing glasses/contacts?: No Does the patient have difficulty concentrating, remembering, or making decisions?: No Patient able to express need for assistance with ADLs?: Yes Does the patient have difficulty dressing or bathing?: No Independently performs ADLs?: Yes (appropriate for developmental age) Does the patient have difficulty walking or climbing stairs?: No Weakness of Legs: Right(BKA) Weakness of Arms/Hands: None  Permission Sought/Granted Permission sought to share information with : Facility Sport and exercise psychologist, Family Supports       Permission granted to share info w AGENCY: [facility]ly and Kathe [facility]        Emotional Assessment Appearance:: Appears stated age Attitude/Demeanor/Rapport: Engaged Affect (typically observed): Accepting Orientation: : Oriented to Self, Oriented to Place, Oriented to  Time, Oriented to Situation Alcohol / Substance Use: Not Applicable Psych Involvement: No (comment)  Admission diagnosis:  Chest Pain Patient Active Problem List   Diagnosis Date Noted  . Pressure injury of skin 11/11/2018  . Acute myocardial infarction ([place]) 11/10/2018  . CKD (chronic kidney disease), stage II 11/10/2018  . Amputation stump infection ([place]) 10/28/2018  . Type II diabetes mellitus  with renal manifestations ([place]) 08/07/2018  . Uncontrolled type 2 diabetes mellitus with hyperosmolar nonketotic hyperglycemia ([place]) 08/07/2018  . Non-pressure chronic ulcer of right calf  limited to breakdown of skin ([place]) 07/06/2018  . Type 2 diabetes mellitus without complication, without long-term current use of insulin ([place]) 06/15/2018  . Chronic low back pain without sciatica 06/15/2018  . Idiopathic chronic venous hypertension of left lower extremity with ulcer and inflammation ([place])   . Venous stasis ulcer of left calf limited to breakdown of skin without varicose veins ([place]) 04/23/2018  . Acquired absence of right leg below knee ([place]) 06/13/2017  . Acute renal failure superimposed on stage 3 chronic kidney disease ([place]) 03/15/2017  . Diabetic polyneuropathy associated with type 2 diabetes mellitus ([place]) 01/23/2017  . AKI (acute kidney injury) ([place]) 09/28/2016  . Nausea vomiting and diarrhea 09/28/2016  . Onychomycosis of multiple toenails with type 2 diabetes mellitus ([place]) 08/29/2015  . MRSA carrier 04/20/2014  . Penile wart 02/22/2014  . HIV disease ([place])   . Insulin-requiring or dependent type II diabetes mellitus ([place]) 02/04/2014  . Dental anomaly 11/20/2012  . Arthritis of right knee 02/23/2012  . Hyperlipidemia 11/10/2011  . Chronic pain 08/07/2011  . Meralgia paraesthetica 04/23/2011  . ERECTILE DYSFUNCTION 08/22/2008  . Essential hypertension 05/19/2008   PCP:  System, Pcp Not In Pharmacy:   Clarinda [place], [address] [place] [address] Phone: [phone] Fax: [phone]     Social Determinants of Health (SDOH) Interventions    Readmission Risk Interventions Readmission Risk Prevention Plan 11/13/2018 11/13/2018 11/03/2018  Transportation Screening - Complete Complete  PCP or Specialist Appt within 3-5 Days - - Complete  HRI or [place] - - Complete  Social Work Consult for [place] Planning/Counseling - - Complete  Palliative Care Screening - - Not Applicable  Medication Review Press photographer) - Complete Complete  PCP or Specialist  appointment within 3-5 days of discharge Complete - -  [place] or Home Care Consult Complete - -  SW Recovery Care/Counseling Consult Complete - -  Palliative Care Screening Not Applicable - -  [place] Not Applicable - -  Some recent data might be hidden

## 2018-11-14 DIAGNOSIS — T8743 Infection of amputation stump, right lower extremity: Secondary | ICD-10-CM | POA: Diagnosis not present

## 2018-11-14 DIAGNOSIS — L03115 Cellulitis of right lower limb: Secondary | ICD-10-CM | POA: Diagnosis not present

## 2018-11-14 DIAGNOSIS — I1310 Hypertensive heart and chronic kidney disease without heart failure, with stage 1 through stage 4 chronic kidney disease, or unspecified chronic kidney disease: Secondary | ICD-10-CM | POA: Diagnosis not present

## 2018-11-14 DIAGNOSIS — I214 Non-ST elevation (NSTEMI) myocardial infarction: Secondary | ICD-10-CM | POA: Diagnosis not present

## 2018-11-14 DIAGNOSIS — L02411 Cutaneous abscess of right axilla: Secondary | ICD-10-CM | POA: Diagnosis not present

## 2018-11-14 DIAGNOSIS — T8789 Other complications of amputation stump: Secondary | ICD-10-CM | POA: Diagnosis not present

## 2018-11-16 DIAGNOSIS — M545 Low back pain: Secondary | ICD-10-CM | POA: Diagnosis not present

## 2018-11-16 DIAGNOSIS — T8789 Other complications of amputation stump: Secondary | ICD-10-CM | POA: Diagnosis not present

## 2018-11-16 DIAGNOSIS — T8743 Infection of amputation stump, right lower extremity: Secondary | ICD-10-CM | POA: Diagnosis not present

## 2018-11-16 DIAGNOSIS — I1310 Hypertensive heart and chronic kidney disease without heart failure, with stage 1 through stage 4 chronic kidney disease, or unspecified chronic kidney disease: Secondary | ICD-10-CM | POA: Diagnosis not present

## 2018-11-16 DIAGNOSIS — Z79899 Other long term (current) drug therapy: Secondary | ICD-10-CM | POA: Diagnosis not present

## 2018-11-16 DIAGNOSIS — L03115 Cellulitis of right lower limb: Secondary | ICD-10-CM | POA: Diagnosis not present

## 2018-11-16 DIAGNOSIS — I214 Non-ST elevation (NSTEMI) myocardial infarction: Secondary | ICD-10-CM | POA: Diagnosis not present

## 2018-11-16 DIAGNOSIS — L02411 Cutaneous abscess of right axilla: Secondary | ICD-10-CM | POA: Diagnosis not present

## 2018-11-16 NOTE — Telephone Encounter (Signed)
**Note De-Identified Keith [name] Obfuscation** Patient contacted regarding discharge from [facility] on 11/13/2018.  Patient understands to follow up with provider Robbie Lis, PA-c on 11/26/2018 at 10:45 at [facility] in [place]. Patient understands discharge instructions? Yes Patient understands medications and regiment? Yes Patient understands to bring all medications to this visit? Yes  The pt reports that he is doing well since his hospital discharge and he denies, CP, SOB, dizziness and states that he has no redness, swelling, drainage or pain at cath site and is without a fever.  Per the pts request I called back and left a message on his VM with his hospital f/u date and time and the office address and phone number.

## 2018-11-17 ENCOUNTER — Ambulatory Visit: Payer: Medicare Other | Admitting: Family Medicine

## 2018-11-18 ENCOUNTER — Other Ambulatory Visit: Payer: Self-pay

## 2018-11-18 ENCOUNTER — Telehealth: Payer: Self-pay

## 2018-11-18 ENCOUNTER — Telehealth: Payer: Self-pay | Admitting: Orthopedic Surgery

## 2018-11-18 DIAGNOSIS — L02411 Cutaneous abscess of right axilla: Secondary | ICD-10-CM | POA: Diagnosis not present

## 2018-11-18 DIAGNOSIS — L03115 Cellulitis of right lower limb: Secondary | ICD-10-CM | POA: Diagnosis not present

## 2018-11-18 DIAGNOSIS — T8789 Other complications of amputation stump: Secondary | ICD-10-CM | POA: Diagnosis not present

## 2018-11-18 DIAGNOSIS — T8743 Infection of amputation stump, right lower extremity: Secondary | ICD-10-CM | POA: Diagnosis not present

## 2018-11-18 DIAGNOSIS — I214 Non-ST elevation (NSTEMI) myocardial infarction: Secondary | ICD-10-CM | POA: Diagnosis not present

## 2018-11-18 DIAGNOSIS — I1310 Hypertensive heart and chronic kidney disease without heart failure, with stage 1 through stage 4 chronic kidney disease, or unspecified chronic kidney disease: Secondary | ICD-10-CM | POA: Diagnosis not present

## 2018-11-18 MED ORDER — BLOOD GLUCOSE METER KIT: PACK | 0 refills | Status: DC

## 2018-11-18 NOTE — Telephone Encounter (Signed)
Patient has been very non compliant and refuses to show nurses his medication or follow any treatments. Patient was in hospital for heart attack recently and still refuses care. [place] will have to discharge patient is he continues to be non complaint.  [phone]

## 2018-11-18 NOTE — Telephone Encounter (Signed)
Received voicemail message from [facility] with [facility] needing to know what treatment  Does Dr Sharol Given want done? The number to contact Diane is [phone]

## 2018-11-19 ENCOUNTER — Telehealth: Payer: Self-pay | Admitting: Orthopedic Surgery

## 2018-11-19 NOTE — Telephone Encounter (Signed)
Diane-nurse with [facility] and she advised patient is non compliant with his wound care. Patient will  Not discuss his medical status in front of his family. Patient is not sharing any information with [facility]. Patient is not changing the dressing at all until yesterday. Diane put silvadene on the wound yesterday. Diane asked if patient can get a Rx for a sleeve his leg. The number to contact patient is [phone]

## 2018-11-19 NOTE — Telephone Encounter (Signed)
See other message this has been addressed.

## 2018-11-19 NOTE — Telephone Encounter (Signed)
I called and advised that per last dictation manuka honey dressing changes twice a week. HHN states that he is refusing care, will not provide his own wound care on the days he is supposed to do it. Wears his prosthetic all the time just had a heart attack last week. Pt is non compliant and may be discharged from service due to this. Pt will not allow them to discuss care in front of family members. Limited ability to care for the pt and wanted to make [place] aware. appt was cancelled for today and pt rescheduled for next week.

## 2018-11-20 DIAGNOSIS — L02411 Cutaneous abscess of right axilla: Secondary | ICD-10-CM | POA: Diagnosis not present

## 2018-11-20 DIAGNOSIS — T8789 Other complications of amputation stump: Secondary | ICD-10-CM | POA: Diagnosis not present

## 2018-11-20 DIAGNOSIS — I214 Non-ST elevation (NSTEMI) myocardial infarction: Secondary | ICD-10-CM | POA: Diagnosis not present

## 2018-11-20 DIAGNOSIS — I1310 Hypertensive heart and chronic kidney disease without heart failure, with stage 1 through stage 4 chronic kidney disease, or unspecified chronic kidney disease: Secondary | ICD-10-CM | POA: Diagnosis not present

## 2018-11-20 DIAGNOSIS — L03115 Cellulitis of right lower limb: Secondary | ICD-10-CM | POA: Diagnosis not present

## 2018-11-20 DIAGNOSIS — T8743 Infection of amputation stump, right lower extremity: Secondary | ICD-10-CM | POA: Diagnosis not present

## 2018-11-22 DIAGNOSIS — I1310 Hypertensive heart and chronic kidney disease without heart failure, with stage 1 through stage 4 chronic kidney disease, or unspecified chronic kidney disease: Secondary | ICD-10-CM | POA: Diagnosis not present

## 2018-11-22 DIAGNOSIS — T8789 Other complications of amputation stump: Secondary | ICD-10-CM | POA: Diagnosis not present

## 2018-11-22 DIAGNOSIS — T8743 Infection of amputation stump, right lower extremity: Secondary | ICD-10-CM | POA: Diagnosis not present

## 2018-11-22 DIAGNOSIS — L03115 Cellulitis of right lower limb: Secondary | ICD-10-CM | POA: Diagnosis not present

## 2018-11-22 DIAGNOSIS — L02411 Cutaneous abscess of right axilla: Secondary | ICD-10-CM | POA: Diagnosis not present

## 2018-11-22 DIAGNOSIS — I214 Non-ST elevation (NSTEMI) myocardial infarction: Secondary | ICD-10-CM | POA: Diagnosis not present

## 2018-11-23 DIAGNOSIS — T8743 Infection of amputation stump, right lower extremity: Secondary | ICD-10-CM | POA: Diagnosis not present

## 2018-11-23 DIAGNOSIS — I1310 Hypertensive heart and chronic kidney disease without heart failure, with stage 1 through stage 4 chronic kidney disease, or unspecified chronic kidney disease: Secondary | ICD-10-CM | POA: Diagnosis not present

## 2018-11-23 DIAGNOSIS — T8789 Other complications of amputation stump: Secondary | ICD-10-CM | POA: Diagnosis not present

## 2018-11-23 DIAGNOSIS — L03115 Cellulitis of right lower limb: Secondary | ICD-10-CM | POA: Diagnosis not present

## 2018-11-23 DIAGNOSIS — L02411 Cutaneous abscess of right axilla: Secondary | ICD-10-CM | POA: Diagnosis not present

## 2018-11-23 DIAGNOSIS — I214 Non-ST elevation (NSTEMI) myocardial infarction: Secondary | ICD-10-CM | POA: Diagnosis not present

## 2018-11-24 ENCOUNTER — Telehealth: Payer: Self-pay | Admitting: Family Medicine

## 2018-11-24 ENCOUNTER — Other Ambulatory Visit: Payer: Self-pay | Admitting: Family Medicine

## 2018-11-24 ENCOUNTER — Telehealth: Payer: Self-pay

## 2018-11-24 DIAGNOSIS — T8743 Infection of amputation stump, right lower extremity: Secondary | ICD-10-CM | POA: Diagnosis not present

## 2018-11-24 DIAGNOSIS — I1310 Hypertensive heart and chronic kidney disease without heart failure, with stage 1 through stage 4 chronic kidney disease, or unspecified chronic kidney disease: Secondary | ICD-10-CM | POA: Diagnosis not present

## 2018-11-24 DIAGNOSIS — T8789 Other complications of amputation stump: Secondary | ICD-10-CM | POA: Diagnosis not present

## 2018-11-24 DIAGNOSIS — I214 Non-ST elevation (NSTEMI) myocardial infarction: Secondary | ICD-10-CM | POA: Diagnosis not present

## 2018-11-24 DIAGNOSIS — L02411 Cutaneous abscess of right axilla: Secondary | ICD-10-CM | POA: Diagnosis not present

## 2018-11-24 DIAGNOSIS — L03115 Cellulitis of right lower limb: Secondary | ICD-10-CM | POA: Diagnosis not present

## 2018-11-24 NOTE — Telephone Encounter (Signed)
Received a Call from Shiraddha, [place] with [facility] care. She wanted to call and inform that she had a visit with patient today and there were only 3 meds he had in the home. She states he does not have Amlodipine, aspirin, Lexapro, Humalog, Oxybutyin, or Brilinta. Please advise if we need to refill this or if you need him to come in. She just had to advise that he is not compliant. Thanks!  Shiraddha's number is [phone]

## 2018-11-24 NOTE — Progress Notes (Deleted)
Cardiology Office Note    Date:  11/24/2018   ID:  Keith [name], DOB 12-01-1965, MRN [id number]  PCP:  System, Pcp Not In  Cardiologist:  Larae Grooms, MD  Chief Complaint: Hospital follow up  History of Present Illness:   Keith [name] is a 53 y.o. male malewith Uncontrolled DM (recentA1C >15.5), HTN, HIV, CKD stage II, chronic normocytic anemia, chronic pain, osteomyelitis, abscess/ulcer of R transtibial s/p r BKA and recent finding of CAD presents for hospital follow up.   Admitted 11/2018 with NSTEMI with likely late presenting, with hsTroponin peaked at >27000 x3. Underwent cardiac with PCI/DES x1 to OM. Residual disease in the apical LAD, dRCA and diag branches planned for medical therapy. Placed on DAPT with ASA/Brilinta to continue for at least one year. No residual chest pain.   Here today for follow up.    Past Medical History:  Diagnosis Date  . Abscess of right foot    abscess/ulcer of R transtibial amputation requiring IV abx  . AIDS ([place])   . Chronic anemia   . Chronic knee pain    right  . Chronic pain   . CKD (chronic kidney disease), stage II   . Diabetes type 2, uncontrolled ([place])    HgA1c 17.6 (04/27/2010)  . Diabetic foot ulcer ([place]) 01/2017   right foot  . Erectile dysfunction   . Genital warts   . HIV (human immunodeficiency virus infection) ([place]) 2009   CD4 count 100, VL 13800 (05/01/2010)  . Hypertension   . Osteomyelitis ([place])    h/o hand  . Osteomyelitis of metatarsal ([place]) 04/28/2017    Past Surgical History:  Procedure Laterality Date  . AMPUTATION Right 05/02/2017   Procedure: AMPUTATION TRANSMETARSAL;  Surgeon: [name], MD;  Location: [place];  Service: Orthopedics;  Laterality: Right;  . AMPUTATION Right 06/13/2017   Procedure: RIGHT BELOW KNEE AMPUTATION;  Surgeon: [name], MD;  Location: [place];  Service: Orthopedics;  Laterality: Right;  . BELOW KNEE LEG AMPUTATION Right 06/13/2017  . BELOW KNEE LEG AMPUTATION     . CORONARY STENT INTERVENTION N/A 11/10/2018   Procedure: CORONARY STENT INTERVENTION;  Surgeon: Leonie Man, MD;  Location: [place] CV [facility];  Service: Cardiovascular;  Laterality: N/A;  . HERNIA REPAIR    . I&D EXTREMITY Left 08/21/2014   Procedure: INCISION AND DRAINAGE LEFT SMALL FINGER;  Surgeon: Leanora Cover, MD;  Location: [place];  Service: Orthopedics;  Laterality: Left;  . I&D EXTREMITY Right 03/18/2017   Procedure: IRRIGATION AND DEBRIDEMENT EXTREMITY;  Surgeon: [name], MD;  Location: [place];  Service: Orthopedics;  Laterality: Right;  . LEFT HEART CATH AND CORONARY ANGIOGRAPHY N/A 11/10/2018   Procedure: LEFT HEART CATH AND CORONARY ANGIOGRAPHY;  Surgeon: Leonie Man, MD;  Location: [place] [facility];  Service: Cardiovascular;  Laterality: N/A;  . MINOR IRRIGATION AND DEBRIDEMENT OF WOUND Right 04/22/2014   Procedure: IRRIGATION AND DEBRIDEMENT OF RIGHT NECK ABCESS;  Surgeon: Jerrell Belfast, MD;  Location: [place];  Service: ENT;  Laterality: Right;  . MULTIPLE EXTRACTIONS WITH ALVEOLOPLASTY N/A 01/18/2013   Procedure: MULTIPLE EXTRACION 3, 6, 7, 10, 11, 13, 21, 22, 27, 28, 29, 30 WITH ALVEOLOPLASTY;  Surgeon: Gae Bon, DDS;  Location: [place];  Service: Oral Surgery;  Laterality: N/A;  . SKIN SPLIT GRAFT Right 03/21/2017   Procedure: IRRIGATION AND DEBRIDEMENT RIGHT FOOT AND APPLY SPLIT THICKNESS SKIN GRAFT AND WOUND VAC;  Surgeon: [name],  MD;  Location: [place];  Service: Orthopedics;  Laterality: Right;  . TEE WITHOUT CARDIOVERSION N/A 05/02/2017   Procedure: TRANSESOPHAGEAL ECHOCARDIOGRAM (TEE);  Surgeon: Acie Fredrickson Wonda Cheng, MD;  [facility];  Service: Cardiovascular;  Laterality: N/A;  coincidental to orthopedic case    Current Medications: Prior to Admission medications   Medication Sig Start Date End Date Taking? Authorizing Provider  amLODipine (NORVASC) 10 MG tablet Take 1 tablet (10 mg total) by mouth daily. 07/09/18   [name], FNP  aspirin  EC 81 MG EC tablet Take 1 tablet (81 mg total) by mouth daily. 11/13/18   Cheryln Manly, NP  atorvastatin (LIPITOR) 80 MG tablet Take 1 tablet (80 mg total) by mouth daily at 6 PM. 11/13/18   Cheryln Manly, NP  bictegravir-emtricitabine-tenofovir AF (BIKTARVY) 50-200-25 MG TABS tablet Take 1 tablet by mouth daily. 10/15/18   [name], MD  blood glucose meter kit and supplies Dispense based on patient and insurance preference. Use up to four times daily as directed. (FOR ICD-10 E10.9, E11.9). 11/18/18   [name], FNP  carvedilol (COREG) 25 MG tablet Take 1 tablet (25 mg total) by mouth 2 (two) times daily with a meal. 11/13/18   Cheryln Manly, NP  collagenase (SANTYL) ointment Apply topically 2 (two) times daily at 10 AM and 5 PM. Patient not taking: Reported on 11/10/2018 11/03/18   Kayleen Memos, DO  EASY COMFORT PEN NEEDLES 31G X 5 MM MISC INJECT 20 UNITS DAILY AS DIRECTED. Patient taking differently: 20 Units See admin instructions. As directed 10/19/18   [name], FNP  escitalopram (LEXAPRO) 20 MG tablet Take 20 mg by mouth daily. 07/15/18   [provider]  glucose blood (COOL BLOOD GLUCOSE TEST STRIPS) test strip Use as instructed 01/05/18   [name], FNP  Insulin Glargine (LANTUS SOLOSTAR) 100 UNIT/ML Solostar Pen Inject 30 Units into the skin daily. 09/14/18   [name], FNP  insulin lispro (HUMALOG) 100 UNIT/ML injection Inject 10 units under skin 3 times a day, with meals, for blood glucose levels greater than 125. 09/14/18 09/14/19  [name], FNP  Insulin Syringes, Disposable, U-100 0.3 ML MISC 1 Syringe by Does not apply route 3 (three) times daily. 09/22/18   [name], FNP  Lancets [facility] ULTRASOFT) lancets Use as instructed 01/05/18   [name], FNP  nitroGLYCERIN (NITROSTAT) 0.4 MG SL tablet Place 1 tablet (0.4 mg total) under the tongue every 5 (five) minutes x 3 doses as needed for chest pain. 11/13/18   Cheryln Manly, NP  oxybutynin (DITROPAN) 5 MG tablet TAKE 1 TABLET (5 MG TOTAL) BY MOUTH 2 (TWO) TIMES DAILY. 10/19/18   [name], FNP  oxyCODONE 10 MG TABS Take 1 tablet (10 mg total) by mouth daily as needed for up to 5 doses for severe pain or breakthrough pain (pain). 11/03/18   Kayleen Memos, DO  silver sulfADIAZINE (SILVADENE) 1 % cream Apply 1 application topically daily. Apply to wound daily Patient not taking: Reported on 11/10/2018 11/06/18   [name], MD  ticagrelor (BRILINTA) 90 MG TABS tablet Take 1 tablet (90 mg total) by mouth every 12 (twelve) hours. 11/13/18   Cheryln Manly, NP    Allergies:   Sulfur and Sulfa antibiotics   Social History   Socioeconomic History  . Marital status: Single    Spouse name: Not on file  . Number of  children: 3  . Years of education: 84  . Highest education level: Not on file  Occupational History  . Occupation: disabled  Social Needs  . Financial resource strain: Not on file  . Food insecurity    Worry: Not on file    Inability: Not on file  . Transportation needs    Medical: Not on file    Non-medical: Not on file  Tobacco Use  . Smoking status: Never Smoker  . Smokeless tobacco: Never Used  Substance and Sexual Activity  . Alcohol use: Never    Frequency: Never  . Drug use: Never    Comment: OTC ASA  . Sexual activity: Yes    Partners: Female    Comment: given condoms  Lifestyle  . Physical activity    Days per week: Not on file    Minutes per session: Not on file  . Stress: Not on file  Relationships  . Social Herbalist on phone: Not on file    Gets together: Not on file    Attends religious service: Not on file    Active member of club or organization: Not on file    Attends meetings of clubs or organizations: Not on file    Relationship status: Not on file  Other Topics Concern  . Not on file  Social History Narrative   ** Merged History Encounter **       Worked for the city of  [place] for 18 years.   Unemployed.    Applying for disability.   Medicaid patient.     Family History:  The patient's family history includes Arthritis in his father; Cancer (age of onset: 52) in his maternal grandmother; Depression in his paternal grandmother; Hypertension in his brother, father, and mother. ***  ROS:   Please see the history of present illness.    ROS All other systems reviewed and are negative.   PHYSICAL EXAM:   VS:  There were no vitals taken for this visit.   GEN: Well nourished, well developed, in no acute distress  HEENT: normal  Neck: no JVD, carotid bruits, or masses Cardiac: ***RRR; no murmurs, rubs, or gallops,no edema  Respiratory:  clear to auscultation bilaterally, normal work of breathing GI: soft, nontender, nondistended, + BS MS: no deformity or atrophy  Skin: warm and dry, no rash Neuro:  Alert and Oriented x 3, Strength and sensation are intact Psych: euthymic mood, full affect  Wt Readings from Last 3 Encounters:  11/13/18 251 lb 12.3 oz (114.2 kg)  11/06/18 246 lb (111.6 kg)  11/03/18 246 lb 7.6 oz (111.8 kg)      Studies/Labs Reviewed:   EKG:  EKG is ordered today.  The ekg ordered today demonstrates ***  Recent Labs: 08/08/2018: Magnesium 2.2 11/10/2018: ALT 32; TSH 1.633 11/11/2018: BUN 17; Creatinine, Ser 1.21; Potassium 3.7; Sodium 136 11/13/2018: Hemoglobin 9.3; Platelets 341   Lipid Panel    Component Value Date/Time   CHOL 234 (H) 11/11/2018 0219   CHOL 274 (H) 09/07/2018 1056   TRIG 134 11/11/2018 0219   HDL 36 (L) 11/11/2018 0219   HDL 29 (L) 09/07/2018 1056   CHOLHDL 6.5 11/11/2018 0219   VLDL 27 11/11/2018 0219   LDLCALC 171 (H) 11/11/2018 0219   LDLCALC 194 (H) 09/07/2018 1056    Additional studies/ records that were reviewed today include:   Echocardiogram: 11/11/2018  1. The left ventricle has normal systolic function, with an ejection fraction of 55-60%. The  cavity size was normal. Left ventricular diastolic  Doppler parameters are consistent with impaired relaxation.  2. The right ventricle has normal systolic function. The cavity was normal. There is no increase in right ventricular wall thickness.  3. No evidence of mitral valve stenosis.  4. The tricuspid valve is grossly normal.  5. No stenosis of the aortic valve.  6. The aorta is normal in size and structure.  7. There is mild dilatation of the ascending aorta.  8. The interatrial septum was not assessed.  CORONARY STENT INTERVENTION  11/10/2018  LEFT HEART CATH AND CORONARY ANGIOGRAPHY  Conclusion    1st Mrg lesion is 90% stenosed.  A drug-eluting stent was successfully placed using a STENT RESOLUTE ONYX 2.25X15. -- 2.4 mm  Post intervention, there is a 0% residual stenosis.  --------------------------  Mid Cx-1 lesion is 55% stenosed. Mid Cx-2 lesion is 40% stenosed.  1st Diag lesion is 80% stenosed. 2nd Diag lesion is 80% stenosed.  Dist LAD-1 lesion is 40% stenosed. Dist (apical) LAD-2 lesion is 90% stenosed.  Dist RCA lesion is 50% stenosed.  --------------------------  LV end diastolic pressure is moderately elevated.   SUMMARY  Severe single vessel CAD - Ostial OM1 90% with apical LAD 90%, distal Cx 40% & dRCA 50% ? Successful DES PCI of Ost-Prox OM1 (Resolute Onyx DES 2.25 mm x 15 mm -- 2.4 mm)  Moderately elevated LVEDP ~21 mmHg  Very difficult to engage RCA due to High Anterior tack-eoff & tortuous Innominate Artery (multiple catheters used before AL1)   RECOMMENDATIONS  Admit to CCU for overnight monitoring. TR Band removal per protocol  Echocardiogram ordered (No LV Gram to conserve Contrast  Adding Carvedilol 3.125 mg BID & High dose Atorvastatin  Needs aggressive BP & DM management  DAPT x 1 year == Care Management Consult.   Diagnostic Dominance: Right  Intervention       ASSESSMENT & PLAN:    1. CAD s/p DES to OM - medical therapy for residual disease as above  2. HTN  3.  Uncontrolled DM - HgbA1c 15.5 09/2018.  4. CKD stage II  5. Chronic anemia  6. HLD - 11/11/2018: Cholesterol 234; HDL 36; LDL Cholesterol 171; Triglycerides 134; VLDL 27 - Continue statin - Lipid panel and LFTS in 6 weeks     Medication Adjustments/Labs and Tests Ordered: Current medicines are reviewed at length with the patient today.  Concerns regarding medicines are outlined above.  Medication changes, Labs and Tests ordered today are listed in the Patient Instructions below. There are no Patient Instructions on file for this visit.   Jarrett Soho, [place]  11/24/2018 1:26 PM    [facility] [place], [place], [place]  [address] Phone: [phone]; Fax: [phone]

## 2018-11-24 NOTE — Telephone Encounter (Signed)
Received call from [place] with concerns of patient's non-compliancy. Patient recently hospitalized for MI, and scheduled with [place] for follow up.  He has had multiple no-shows. Nurse will follow up with patient and encourage him to follow up on office visit, once she returns to his home on 11/26/2018.

## 2018-11-25 DIAGNOSIS — F329 Major depressive disorder, single episode, unspecified: Secondary | ICD-10-CM | POA: Diagnosis not present

## 2018-11-25 DIAGNOSIS — Z658 Other specified problems related to psychosocial circumstances: Secondary | ICD-10-CM | POA: Diagnosis not present

## 2018-11-25 DIAGNOSIS — F411 Generalized anxiety disorder: Secondary | ICD-10-CM | POA: Diagnosis not present

## 2018-11-26 ENCOUNTER — Ambulatory Visit: Payer: Medicare Other | Admitting: Physician Assistant

## 2018-11-26 DIAGNOSIS — L03115 Cellulitis of right lower limb: Secondary | ICD-10-CM | POA: Diagnosis not present

## 2018-11-26 DIAGNOSIS — I1310 Hypertensive heart and chronic kidney disease without heart failure, with stage 1 through stage 4 chronic kidney disease, or unspecified chronic kidney disease: Secondary | ICD-10-CM | POA: Diagnosis not present

## 2018-11-26 DIAGNOSIS — T8789 Other complications of amputation stump: Secondary | ICD-10-CM | POA: Diagnosis not present

## 2018-11-26 DIAGNOSIS — I214 Non-ST elevation (NSTEMI) myocardial infarction: Secondary | ICD-10-CM | POA: Diagnosis not present

## 2018-11-26 DIAGNOSIS — T8743 Infection of amputation stump, right lower extremity: Secondary | ICD-10-CM | POA: Diagnosis not present

## 2018-11-26 DIAGNOSIS — L02411 Cutaneous abscess of right axilla: Secondary | ICD-10-CM | POA: Diagnosis not present

## 2018-11-27 ENCOUNTER — Ambulatory Visit: Payer: Medicare Other | Admitting: Family

## 2018-11-27 DIAGNOSIS — I214 Non-ST elevation (NSTEMI) myocardial infarction: Secondary | ICD-10-CM | POA: Diagnosis not present

## 2018-11-27 DIAGNOSIS — L02411 Cutaneous abscess of right axilla: Secondary | ICD-10-CM | POA: Diagnosis not present

## 2018-11-27 DIAGNOSIS — T8743 Infection of amputation stump, right lower extremity: Secondary | ICD-10-CM | POA: Diagnosis not present

## 2018-11-27 DIAGNOSIS — T8789 Other complications of amputation stump: Secondary | ICD-10-CM | POA: Diagnosis not present

## 2018-11-27 DIAGNOSIS — I1310 Hypertensive heart and chronic kidney disease without heart failure, with stage 1 through stage 4 chronic kidney disease, or unspecified chronic kidney disease: Secondary | ICD-10-CM | POA: Diagnosis not present

## 2018-11-27 DIAGNOSIS — L03115 Cellulitis of right lower limb: Secondary | ICD-10-CM | POA: Diagnosis not present

## 2018-11-30 ENCOUNTER — Other Ambulatory Visit: Payer: Self-pay | Admitting: *Deleted

## 2018-11-30 DIAGNOSIS — L02411 Cutaneous abscess of right axilla: Secondary | ICD-10-CM | POA: Diagnosis not present

## 2018-11-30 DIAGNOSIS — T8743 Infection of amputation stump, right lower extremity: Secondary | ICD-10-CM | POA: Diagnosis not present

## 2018-11-30 DIAGNOSIS — T8789 Other complications of amputation stump: Secondary | ICD-10-CM | POA: Diagnosis not present

## 2018-11-30 DIAGNOSIS — L03115 Cellulitis of right lower limb: Secondary | ICD-10-CM | POA: Diagnosis not present

## 2018-11-30 DIAGNOSIS — I10 Essential (primary) hypertension: Secondary | ICD-10-CM

## 2018-11-30 DIAGNOSIS — I1310 Hypertensive heart and chronic kidney disease without heart failure, with stage 1 through stage 4 chronic kidney disease, or unspecified chronic kidney disease: Secondary | ICD-10-CM | POA: Diagnosis not present

## 2018-11-30 DIAGNOSIS — I214 Non-ST elevation (NSTEMI) myocardial infarction: Secondary | ICD-10-CM | POA: Diagnosis not present

## 2018-11-30 NOTE — Patient Outreach (Addendum)
Member assessed for potential [facility] Care Management needs as a benefit of [place] Medicare.  Per Patient [name], Keith [name] has had 7 ED or hospital encounters since June 2020.   Telephone call made to [facility] listed home number at [phone]. Patient identifiers confirmed. Discussed with Keith [name] that he has missed his most recent PCP appointment on 11/26/18. He states "ah man, I need to reschedule." Writer confirmed with [facility] that Keith [name] was affiliated with their practice. Writer gave Keith [name] the PCP office number to please contact ASAP to reschedule appointment. He states he will do it at the end of writer's call.   Keith [name] was very interested in [facility] services and gave verbal permission for follow up. Confirmed best contact number as [phone].   Explained that [facility] will not interfere or replace services provided by home health. It appears he has [facility].  Will make referral to [facility] for complex case management. Keith [name] has had recent BKA with medical history of HLD, HTN, HIV, DM, CKD3.  Keith [name] is noted to have a history of medical adherence with increases his likelihood of readmission.  Marthenia Rolling, MSN-Ed, RN,BSN [place] Acute Care Coordinator [phone] [facility]) [phone]  (Toll free office)

## 2018-11-30 NOTE — Patient Outreach (Signed)
Keith [name] [facility]) Care Management  11/30/2018  Keith [name] 06-21-1965 [name]   Referral received from post acute care coordinator as member recently discharged from hospital.  Was admitted 8/4-8/7 for AMI.  He has had 4 admissions and 4 ED visits in the last 6 months.  Per chart he has history of hypertension, diabetes (A1C greater than 15.5 in June 2020), CKD, hyperlipidemia, and right BKA with stump infections/complications.    Call placed to member, identity verified.  This care manager introduced self and stated purpose of call.  [facility] care management services explained.  He currently lives with his mother but state he is in dire need for new housing.  She is moving out of state to a SNF on Monday.  Report he has completed applications for apartments, hoping to find housing prior to having to leave the home next week.  State he will have to try to go to a shelter if he is unable to secure housing.  He's concerned about going to shelter as he has home health for wound care, nursing, and PT but they will not see him while he is in the shelter.  Agrees to have follow up and resources provided by Education officer, museum.  Medications reviewed with member, state he is not taking aspirin despite it being on his discharge summary.  Report he was told not to take it.  He was scheduled to see new PCP at [facility] on 8/11 but didn't show.  Per [facility] coordinator note, contact information as provided to reschedule.  He report he has appointment scheduled for tomorrow, however this is not noted on his schedule in Epic.  He assures this care manager there is one scheduled and he will attend.  While reviewing schedule, noted that he has also missed appointments with ortho specialist (8/21) and cardiologist (8/20).  State he forgot about this appointments.  This care manager inquired about transportation, state he is able to drive himself to appointments.  He verbalizes understanding of  follow up with offices, has been discharged from other services due to no shows.  He reports checking blood sugar daily, state it was 120 today.  Need refill of Humalog but will ask MD during visit tomorrow.  He does not check his blood pressure.  Denies any urgent concerns at this time.  Will notify PCP of involvement.  Will place referral to social worker.  Will follow up with member within the next week.  THN CM Care Plan Problem One     Most Recent Value  Care Plan Problem One  Risk for readmission related to cardiac problems as evidenced by recent hospitalization  Role Documenting the Problem One  Care Management [place] for Problem One  Active  [facility] Long Term Goal   Member will not be readmitted to hospital within the next 31 day  [place] Term Goal Start Date  11/30/18  Interventions for Problem One Long Term Goal  Discharge instructions reviewed with member.  Educated on importance of following plan of care (medication management, home health, etc) in effort to decrease risk of readmission  THN CM Short Term Goal #1   Member will report follow up appointments with PCP, cardiology, and ortho within the next 2 weeks  THN CM Short Term Goal #1 Start Date  11/30/18  Interventions for Short Term Goal #1  Member educated on importance of follow up with MD's in effort to manage conditions outside of the hospital.  Provided with contact info for all offices, advised to call as soon as possible to schedule appointments  THN CM Short Term Goal #2   Member will report taking medications as instructed over the next 4 weeks  THN CM Short Term Goal #2 Start Date  11/30/18  Interventions for Short Term Goal #2  Medications reviewed with member, advised of instructions for Aspirin in light of recent [place], Therapist, sports, MSN [place] Manager [phone]

## 2018-12-01 ENCOUNTER — Other Ambulatory Visit: Payer: Self-pay

## 2018-12-01 ENCOUNTER — Encounter: Payer: Self-pay | Admitting: *Deleted

## 2018-12-01 NOTE — Patient Outreach (Signed)
[name]) Care Management  12/01/2018  Keith [name] [birth year] [name]   High priority referral received from [facility], Valente David on 11/30/18.  "Patient living with mother currently but she is moving out of town to [facility] on Monday.  He is in process of finding housing but is looking for help/resources to prevent homelessness" Attempted to contact patient today on mobile and home numbers listed.  Both went straight to voicemail; left messages and mailed unsuccessful outreach letter.  Will attempt to reach again tomorrow.  [name], BSW Social Worker [phone]

## 2018-12-02 ENCOUNTER — Other Ambulatory Visit: Payer: Self-pay

## 2018-12-02 ENCOUNTER — Ambulatory Visit: Payer: Medicare Other | Admitting: Family Medicine

## 2018-12-02 DIAGNOSIS — T8743 Infection of amputation stump, right lower extremity: Secondary | ICD-10-CM | POA: Diagnosis not present

## 2018-12-02 DIAGNOSIS — T8789 Other complications of amputation stump: Secondary | ICD-10-CM | POA: Diagnosis not present

## 2018-12-02 DIAGNOSIS — L02411 Cutaneous abscess of right axilla: Secondary | ICD-10-CM | POA: Diagnosis not present

## 2018-12-02 DIAGNOSIS — I1310 Hypertensive heart and chronic kidney disease without heart failure, with stage 1 through stage 4 chronic kidney disease, or unspecified chronic kidney disease: Secondary | ICD-10-CM | POA: Diagnosis not present

## 2018-12-02 DIAGNOSIS — I214 Non-ST elevation (NSTEMI) myocardial infarction: Secondary | ICD-10-CM | POA: Diagnosis not present

## 2018-12-02 DIAGNOSIS — L03115 Cellulitis of right lower limb: Secondary | ICD-10-CM | POA: Diagnosis not present

## 2018-12-02 NOTE — Patient Outreach (Signed)
[name]) Care Management  12/02/2018  Keith [name] [birth year] [name]   High priority referral received from [facility], Valente David on 11/30/18.  "Patient living with mother currently but she is moving out of town to [facility] on Monday.  He is in process of finding housing but is looking for help/resources to prevent homelessness" Second attempt to connect with patient today.  Patient did answer but reported that he was receiving PT at the time and asked for a call back after 2:00.   Called him back with no answer and unable to leave message due to mailbox being full.  Will attempt to reach again tomorrow.  [name], BSW Social Worker [phone]

## 2018-12-03 ENCOUNTER — Other Ambulatory Visit: Payer: Self-pay

## 2018-12-03 ENCOUNTER — Ambulatory Visit: Payer: Self-pay

## 2018-12-03 DIAGNOSIS — I1310 Hypertensive heart and chronic kidney disease without heart failure, with stage 1 through stage 4 chronic kidney disease, or unspecified chronic kidney disease: Secondary | ICD-10-CM | POA: Diagnosis not present

## 2018-12-03 DIAGNOSIS — T8789 Other complications of amputation stump: Secondary | ICD-10-CM | POA: Diagnosis not present

## 2018-12-03 DIAGNOSIS — L02411 Cutaneous abscess of right axilla: Secondary | ICD-10-CM | POA: Diagnosis not present

## 2018-12-03 DIAGNOSIS — I214 Non-ST elevation (NSTEMI) myocardial infarction: Secondary | ICD-10-CM | POA: Diagnosis not present

## 2018-12-03 DIAGNOSIS — L03115 Cellulitis of right lower limb: Secondary | ICD-10-CM | POA: Diagnosis not present

## 2018-12-03 DIAGNOSIS — T8743 Infection of amputation stump, right lower extremity: Secondary | ICD-10-CM | POA: Diagnosis not present

## 2018-12-03 NOTE — Patient Outreach (Signed)
[facility]) Care Management  12/03/2018  Keith [name] Jul 04, 1965 [name]   High priority referral received from [facility], Valente David on 11/30/18. "Patient living with mother currently but she is moving out of town to [facility] on Monday. He is in process of finding housing but is looking for help/resources to prevent homelessness" Third attempt to connect with patient today.  Left message on mobile number.  Voicemail box full on home number.  Mailed unsuccessful outreach letter on 12/01/18.  Will close case if no response to letter or voicemail messages by 12/14/18.  [name], BSW Social Worker [phone]

## 2018-12-04 DIAGNOSIS — I214 Non-ST elevation (NSTEMI) myocardial infarction: Secondary | ICD-10-CM | POA: Diagnosis not present

## 2018-12-04 DIAGNOSIS — I1310 Hypertensive heart and chronic kidney disease without heart failure, with stage 1 through stage 4 chronic kidney disease, or unspecified chronic kidney disease: Secondary | ICD-10-CM | POA: Diagnosis not present

## 2018-12-04 DIAGNOSIS — L03115 Cellulitis of right lower limb: Secondary | ICD-10-CM | POA: Diagnosis not present

## 2018-12-04 DIAGNOSIS — T8743 Infection of amputation stump, right lower extremity: Secondary | ICD-10-CM | POA: Diagnosis not present

## 2018-12-04 DIAGNOSIS — T8789 Other complications of amputation stump: Secondary | ICD-10-CM | POA: Diagnosis not present

## 2018-12-04 DIAGNOSIS — L02411 Cutaneous abscess of right axilla: Secondary | ICD-10-CM | POA: Diagnosis not present

## 2018-12-08 ENCOUNTER — Telehealth: Payer: Self-pay

## 2018-12-08 ENCOUNTER — Other Ambulatory Visit: Payer: Self-pay | Admitting: *Deleted

## 2018-12-08 NOTE — Telephone Encounter (Signed)
Jeanie from home health called wanted to make you aware that they can no longer provide service for him at this time.Pt is now living out of his car the Education officer, museum with the home health was trying to find a shelton for him stay didn't have any luck, he was living with his mother an she had to move and he couldn't move with her.

## 2018-12-08 NOTE — Patient Outreach (Addendum)
[facility]) Care Management  12/08/2018  Keith [name] 1965/10/29 [name]    Weekly transition of care call placed to member, no answer.  HIPAA complaint voice message left, will follow up within the next 3-4 business days.    Update:  Call received back from member.  He confirms that his mother has moved out of state, report he is now living out of his car.  Has been in contact with home health social worker Keith [name]) however home health PT and nursing aren't able to continue visits due to his homelessness.  He is hoping to have a place to live by Friday.  Has not been in contact with [facility] BSW, advised that multiple attempts have been made to contact him.  Provided with contact information, advised to call as soon as possible.    Noted that member missed appointment with PCP last week, has not rescheduled that visit nor has he rescheduled his cardiology and ortho appointments.  State he has had a lot on his plate and have been focused on finding a place to live.  Advised of risk for readmission if he is not active with follow ups as recommended.  He verbalizes understanding and state he will reschedule all missed appointments.    Denies any urgent concerns at this time, will follow up within the next week.  THN CM Care Plan Problem One     Most Recent Value  Care Plan Problem One  Risk for readmission related to cardiac problems as evidenced by recent hospitalization  Role Documenting the Problem One  Care Management [place] for Problem One  Active  THN Long Term Goal   Member will not be readmitted to hospital within the next 31 day  [place] Term Goal Start Date  11/30/18  Interventions for Problem One Long Term Goal  Provided member with contact information for BSW in effort to support member with resources to decrease risk of readmission  THN CM Short Term Goal #1   Member will report follow up appointments with PCP, cardiology, and ortho within the  next 2 weeks  THN CM Short Term Goal #1 Start Date  11/30/18  Interventions for Short Term Goal #1  Advised to contact MD office to reschedule appointments as soon as possible  THN CM Short Term Goal #2   Member will report taking medications as instructed over the next 4 weeks  THN CM Short Term Goal #2 Start Date  11/30/18  Interventions for Short Term Goal #2  Educated on importance of keeping up with medications despite homelessness        Keith [name], Therapist, sports, MSN [place] Manager [phone]

## 2018-12-09 ENCOUNTER — Other Ambulatory Visit: Payer: Self-pay

## 2018-12-09 NOTE — Patient Outreach (Signed)
[place] [facility]) Care Management  12/09/2018  NACHMEN GUTERREZ 1965-07-15 [name]   Patient originally referred to [facility] on 11/30/18 to assist with housing resources.  Unsuccessful outreach calls to him on 8/25, 8/26, and 8/27.  Received voicemail message from patient on 12/08/18.  Returned call to him today.   Patient reports that he has slept in his car for the last two nights.  Informed patient that a shelter or hotel(if he can afford cost) are only options for immediate housing.  Patient stated that "all the shelters are full". He has an application for [employer] apartments but needs to get proof of income before submitting.  Encouraged patient to continue submitting applications.  Will provide him with contact information for possible options and will assist with making calls regarding availability.   Patient provided with contact information for [facility] and encouraged to call.  Will follow up with patient tomorrow regarding possible housing options.  [name], BSW Social Worker [phone]

## 2018-12-11 ENCOUNTER — Other Ambulatory Visit: Payer: Self-pay

## 2018-12-11 NOTE — Patient Outreach (Signed)
[place] [facility]) Care Management  12/11/2018  Keith [name] Mar 28, 1966 [id number]   Contacted the following to inquire about possible housing options for patient:  [employer]-has property available in patient's price range.  Provided patient with contact information to inquire further.  [employer]-Per message, has apartment available in patient's price range.  Provided patient with contact information to inquire further.  [place] list 6 months-1 year.    [place]-no availability  [employer]/Private Owner-provided patient with contact information to inquire about whether or not property is still available.  Reviewed multiple properties on [email or web address] but many require first months rent and deposit of same amount which patient states he cannot afford.   Provided patient with contact information for [employer] and [place] Management.   Encouraged patient to visit [facility] in person.    Will follow up next week.  [name], BSW Social Worker [phone]

## 2018-12-15 ENCOUNTER — Other Ambulatory Visit: Payer: Self-pay | Admitting: *Deleted

## 2018-12-15 NOTE — Patient Outreach (Signed)
[place] [facility]) Care Management  12/15/2018  Keith [name] 31-Jan-1966 [id number]   Weekly transition of care call placed to member, no answer.  HIPAA compliant voice message left.  Call immediately received back from member.  He report he is "ok" and state he has appointment with primary MD today.  Advised that appointment is not in Epic and to call to clarify.  Also state he has an appointment with the "leg doctor" today.  He will call them as well.  He has continued to sleep in his car, has been in contact with [place] for support.  Report he is taking medications as instructed, have not checked blood sugar today.  He is hoping to have a place to stay by the end of the week in order to restart home health.  Denies any urgent concerns, will follow up within the next week.  THN CM Care Plan Problem One     Most Recent Value  Care Plan Problem One  Risk for readmission related to cardiac problems as evidenced by recent hospitalization  Role Documenting the Problem One  Care Management [place] for Problem One  Active  THN Long Term Goal   Member will not be readmitted to hospital within the next 31 day  THN Long Term Goal Start Date  11/30/18  Interventions for Problem One Long Term Goal  Reminded of importance of following plan of care and how it decreases readmission risk  THN CM Short Term Goal #1   Member will report follow up appointments with PCP, cardiology, and ortho within the next 2 weeks  THN CM Short Term Goal #1 Start Date  11/30/18  Interventions for Short Term Goal #1  Provided again with contact information for all offices, advised of importance to contact them to reschedule.  THN CM Short Term Goal #2   Member will report taking medications as instructed over the next 4 weeks  THN CM Short Term Goal #2 Start Date  11/30/18  [facility] CM Short Term Goal #2 Met Date  12/15/18     Valente David, RN, MSN [place]  Manager [phone]

## 2018-12-16 ENCOUNTER — Ambulatory Visit: Payer: Self-pay

## 2018-12-16 ENCOUNTER — Other Ambulatory Visit: Payer: Self-pay

## 2018-12-16 NOTE — Patient Outreach (Signed)
[place] [facility]) Care Management  12/16/2018  Keith [name] 02/28/1966 [name]   Attempted to contact patient today to follow up on housing resources provided.  Both numbers "temporarily out of service"  Will attempt to reach again within four business days.    [name], BSW Social Worker [phone]

## 2018-12-17 DIAGNOSIS — Z79899 Other long term (current) drug therapy: Secondary | ICD-10-CM | POA: Diagnosis not present

## 2018-12-17 DIAGNOSIS — M545 Low back pain: Secondary | ICD-10-CM | POA: Diagnosis not present

## 2018-12-21 ENCOUNTER — Other Ambulatory Visit: Payer: Self-pay

## 2018-12-21 ENCOUNTER — Ambulatory Visit: Payer: Self-pay

## 2018-12-21 DIAGNOSIS — I10 Essential (primary) hypertension: Secondary | ICD-10-CM | POA: Diagnosis not present

## 2018-12-21 DIAGNOSIS — G546 Phantom limb syndrome with pain: Secondary | ICD-10-CM | POA: Diagnosis not present

## 2018-12-21 DIAGNOSIS — M545 Low back pain: Secondary | ICD-10-CM | POA: Diagnosis not present

## 2018-12-21 NOTE — Patient Outreach (Signed)
[place] [facility]) Care Management  12/21/2018  Keith [name] [birth year] [name]   Second attempt to contact patient today to follow up on housing resources provided.  Phone went straight to voicemail; left message.   Will make final attempt to reach within four business days.    [name], BSW Social Worker [phone]

## 2018-12-23 ENCOUNTER — Other Ambulatory Visit: Payer: Self-pay | Admitting: *Deleted

## 2018-12-23 IMAGING — DX DG CHEST 2V
2 series · 2 of 2 positions shown · non-contrast
Comparison: 03/15/2017

CLINICAL DATA: Productive cough and shortness of breath over the
last 2 weeks. Hypertension and diabetes.

EXAM:
CHEST - 2 VIEW

[chest lat]
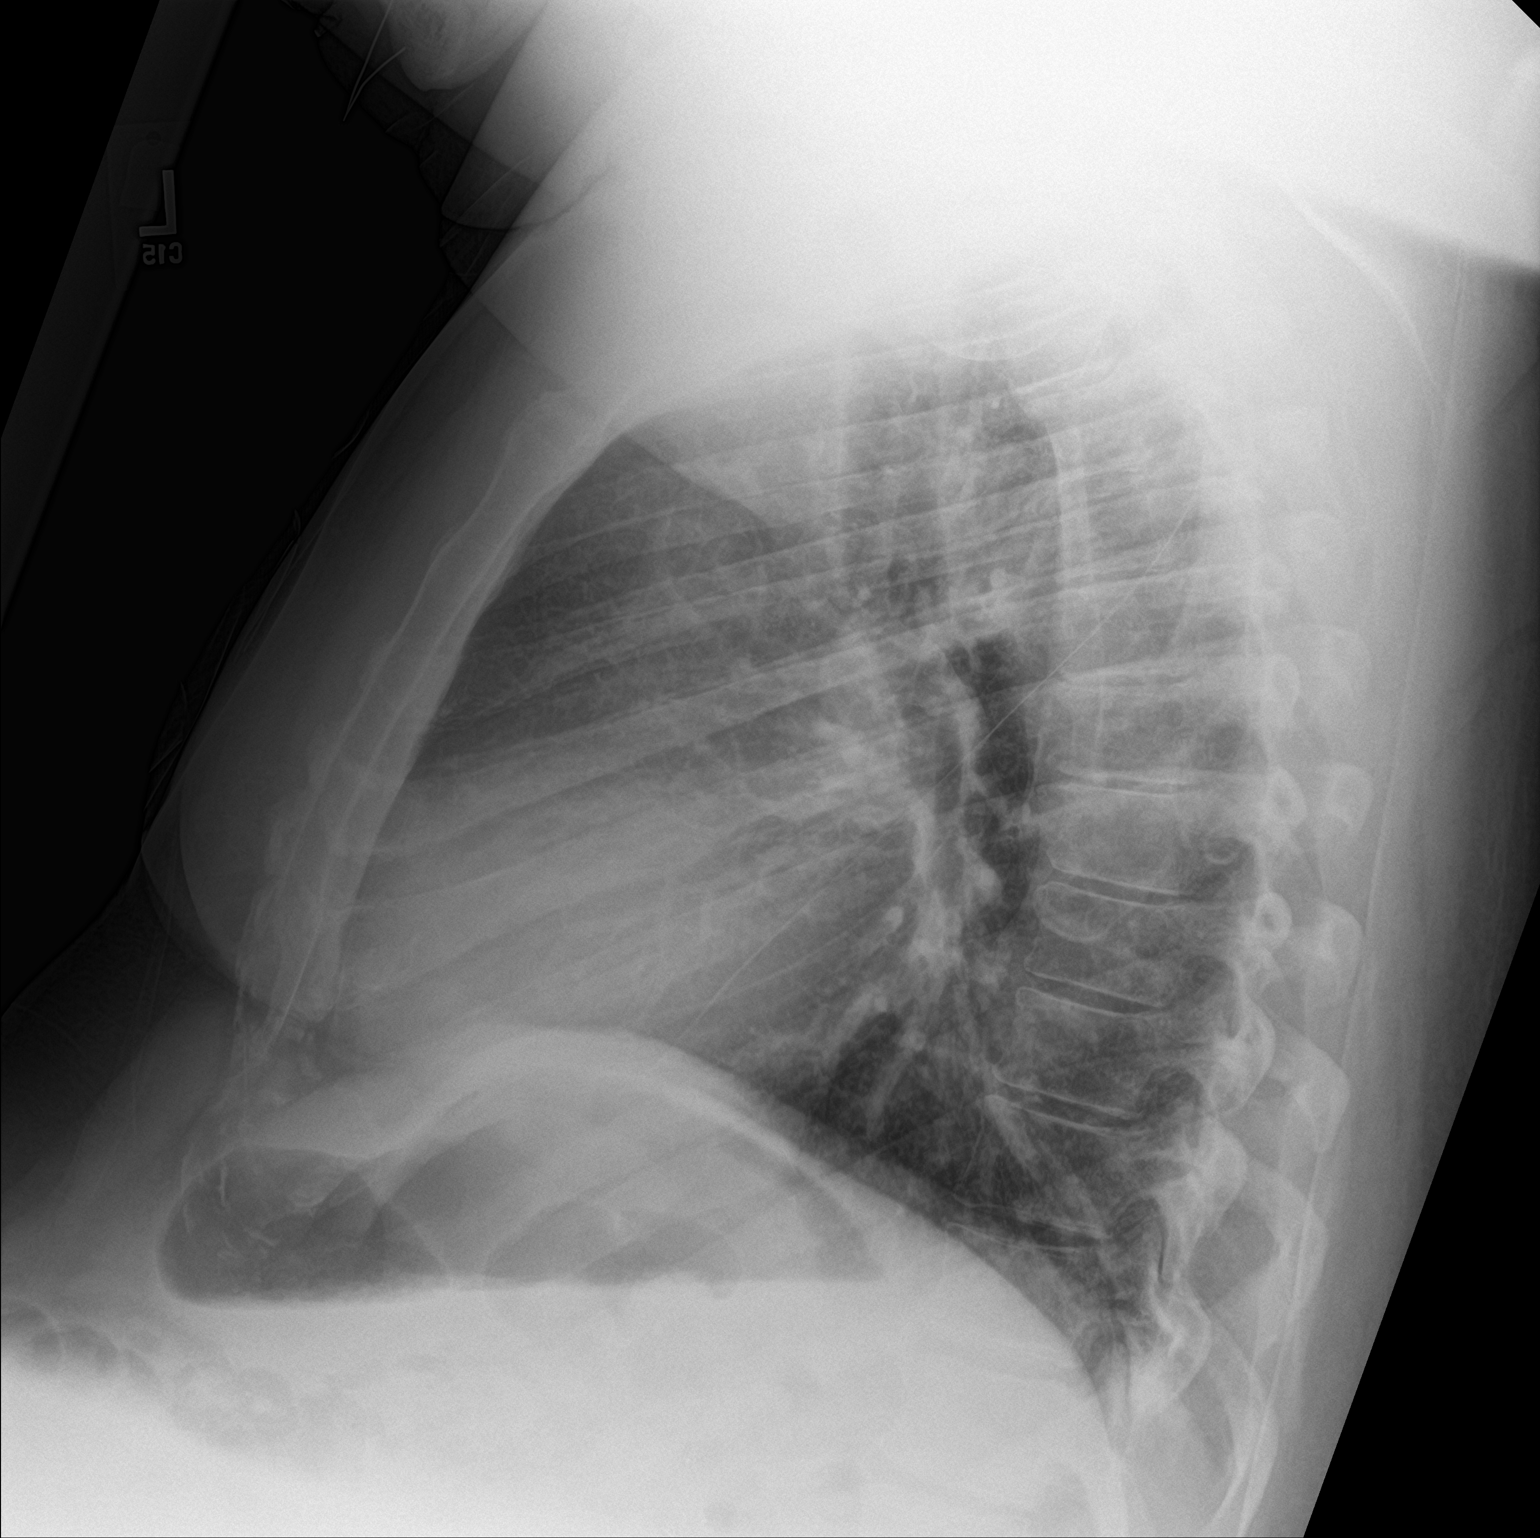

[chest ap]
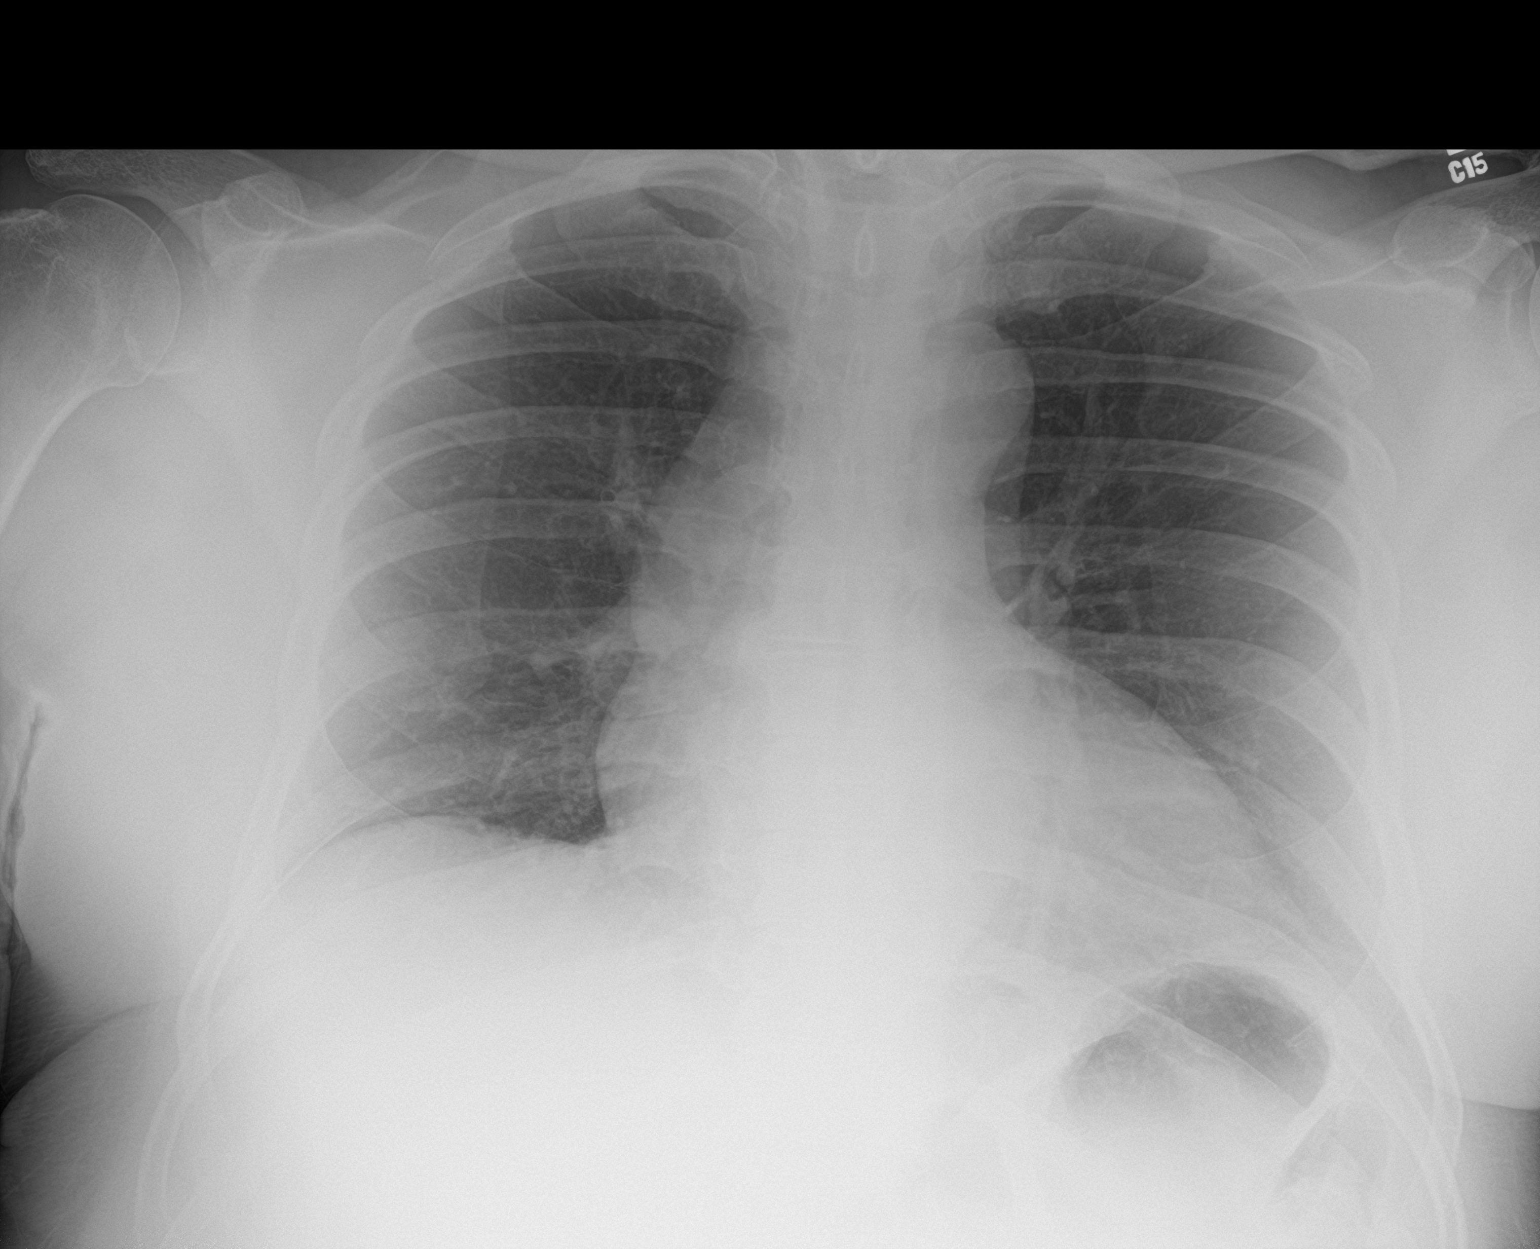

[2 of 2 positions shown; findings below may reference images not displayed]

FINDINGS: The heart is enlarged. The aorta shows tortuosity. I think there is
bronchial thickening centrally and in the lower lungs but there is
no consolidation, collapse or effusion. No significant bone finding.
IMPRESSION: Possible bronchitis, particularly in the lower lungs. No
consolidation or collapse.

## 2018-12-23 NOTE — Patient Outreach (Signed)
[place] [facility]) Care Management  12/23/2018  KAYDON WITTLER 09/14/1965 [name]   Weekly transition of care call placed to member, no answer.  HIPAA compliant voice message left, will follow up within the next 3-4 business days.  Valente David, [place], MSN [place] [phone]

## 2018-12-24 ENCOUNTER — Other Ambulatory Visit: Payer: Self-pay

## 2018-12-24 ENCOUNTER — Ambulatory Visit: Payer: Self-pay

## 2018-12-24 NOTE — Patient Outreach (Signed)
[place] [facility]) Care Management  12/24/2018  [name] [birth year] [id number]   Third unsuccessful attempt to contact patient today to follow up on housing resources provided.  Left voicemail message.  Will close case if no response by 12/29/18.    [name], BSW Social Worker [phone]

## 2018-12-29 ENCOUNTER — Other Ambulatory Visit: Payer: Self-pay | Admitting: *Deleted

## 2018-12-29 ENCOUNTER — Other Ambulatory Visit: Payer: Self-pay

## 2018-12-29 NOTE — Patient Outreach (Signed)
[place] [facility] Care Management  12/29/2018  Keith [name] 1966/02/11 [name]   [facility] Social Work discipline closure due to inability to maintain contact.  [name], BSW Social Worker [phone]

## 2018-12-29 NOTE — Patient Outreach (Signed)
[place] [facility]) Care Management  12/29/2018  QUINDELL REUTHER [birth year] [name]   Unsuccessful outreach #2.  Weekly transition of care call placed to member, no answer.  HIPAA compliant voice message left, unsuccessful outreach letter sent.  Will follow up within the next 3-4 business days.  Valente David, [place], MSN [place] [phone]

## 2019-01-01 ENCOUNTER — Ambulatory Visit: Payer: Medicare Other | Admitting: *Deleted

## 2019-01-04 ENCOUNTER — Other Ambulatory Visit: Payer: Self-pay | Admitting: *Deleted

## 2019-01-04 NOTE — Patient Outreach (Signed)
[place] [facility]) Care Management  01/04/2019  Keith [name] 03/07/1966 [name]   Weekly transition of care call placed to member, unsuccessful for 3rd consecutive outreach.  HIPAA compliant voice message left.  If no response by 9/30 will close case due to inability to maintain contact.  Valente David, [place], MSN [place] [phone]

## 2019-01-06 ENCOUNTER — Other Ambulatory Visit: Payer: Self-pay | Admitting: *Deleted

## 2019-01-06 ENCOUNTER — Other Ambulatory Visit: Payer: Self-pay | Admitting: Family Medicine

## 2019-01-06 DIAGNOSIS — E1165 Type 2 diabetes mellitus with hyperglycemia: Secondary | ICD-10-CM

## 2019-01-06 DIAGNOSIS — E1169 Type 2 diabetes mellitus with other specified complication: Secondary | ICD-10-CM

## 2019-01-06 DIAGNOSIS — Z794 Long term (current) use of insulin: Secondary | ICD-10-CM

## 2019-01-06 DIAGNOSIS — R7309 Other abnormal glucose: Secondary | ICD-10-CM

## 2019-01-06 NOTE — Patient Outreach (Signed)
[place] [facility]) Care Management  01/06/2019  Keith [name] 29-Jun-1965 [name]   No response from member after multiple unsuccessful outreach attempts and letter sent.  Will close case at this time due to inability to maintain contact.  Will notify member and primary MD of case closure.  Valente David, [place], MSN [place] [phone]

## 2019-01-12 DIAGNOSIS — Z658 Other specified problems related to psychosocial circumstances: Secondary | ICD-10-CM | POA: Diagnosis not present

## 2019-01-12 DIAGNOSIS — F411 Generalized anxiety disorder: Secondary | ICD-10-CM | POA: Diagnosis not present

## 2019-01-12 DIAGNOSIS — F329 Major depressive disorder, single episode, unspecified: Secondary | ICD-10-CM | POA: Diagnosis not present

## 2019-01-13 ENCOUNTER — Other Ambulatory Visit: Payer: Self-pay

## 2019-01-13 ENCOUNTER — Encounter: Payer: Self-pay | Admitting: Family Medicine

## 2019-01-13 ENCOUNTER — Ambulatory Visit (INDEPENDENT_AMBULATORY_CARE_PROVIDER_SITE_OTHER): Payer: Medicare Other | Admitting: Family Medicine

## 2019-01-13 VITALS — BP 142/80 | HR 64 | Temp 97.4°F | Ht 70.0 in | Wt 263.2 lb

## 2019-01-13 DIAGNOSIS — Z09 Encounter for follow-up examination after completed treatment for conditions other than malignant neoplasm: Secondary | ICD-10-CM

## 2019-01-13 DIAGNOSIS — R197 Diarrhea, unspecified: Secondary | ICD-10-CM | POA: Diagnosis not present

## 2019-01-13 DIAGNOSIS — Z89511 Acquired absence of right leg below knee: Secondary | ICD-10-CM | POA: Diagnosis not present

## 2019-01-13 DIAGNOSIS — E1169 Type 2 diabetes mellitus with other specified complication: Secondary | ICD-10-CM | POA: Diagnosis not present

## 2019-01-13 DIAGNOSIS — Z794 Long term (current) use of insulin: Secondary | ICD-10-CM

## 2019-01-13 DIAGNOSIS — Z789 Other specified health status: Secondary | ICD-10-CM

## 2019-01-13 DIAGNOSIS — R7303 Prediabetes: Secondary | ICD-10-CM | POA: Diagnosis not present

## 2019-01-13 DIAGNOSIS — E1165 Type 2 diabetes mellitus with hyperglycemia: Secondary | ICD-10-CM

## 2019-01-13 DIAGNOSIS — R7309 Other abnormal glucose: Secondary | ICD-10-CM | POA: Diagnosis not present

## 2019-01-13 DIAGNOSIS — B20 Human immunodeficiency virus [HIV] disease: Secondary | ICD-10-CM

## 2019-01-13 DIAGNOSIS — G8929 Other chronic pain: Secondary | ICD-10-CM

## 2019-01-13 DIAGNOSIS — M545 Low back pain, unspecified: Secondary | ICD-10-CM

## 2019-01-13 DIAGNOSIS — E119 Type 2 diabetes mellitus without complications: Secondary | ICD-10-CM

## 2019-01-13 LAB — GLUCOSE, POCT (MANUAL RESULT ENTRY): POC Glucose: 157 mg/dl — AB (ref 70–99)

## 2019-01-13 LAB — POCT GLYCOSYLATED HEMOGLOBIN (HGB A1C): Hemoglobin A1C: 8.1 % — AB (ref 4.0–5.6)

## 2019-01-13 MED ORDER — LOPERAMIDE HCL 2 MG PO TABS: 2.0000 mg | ORAL_TABLET | Freq: Four times a day (QID) | ORAL | 0 refills | Status: DC | PRN

## 2019-01-13 MED ORDER — BLOOD GLUCOSE METER KIT: PACK | 0 refills | Status: DC

## 2019-01-13 NOTE — Progress Notes (Signed)
[facility]   Established Patient Office Visit  Subjective:  [name], male    DOB: 04-12-1965  Age: 53 y.o. MRN: [id number]  CC:  Chief Complaint  Patient presents with  . Follow-up    re-establish care    HPI Keith [name] is a 53 year old male who presents for Follow Up today.   Past Medical History:  Diagnosis Date  . Abscess of right foot    abscess/ulcer of R transtibial amputation requiring IV abx  . AIDS ([place])   . Chronic anemia   . Chronic knee pain    right  . Chronic pain   . CKD (chronic kidney disease), stage II   . Diabetes type 2, uncontrolled ([place])    HgA1c 17.6 (04/27/2010)  . Diabetic foot ulcer ([place]) 01/2017   right foot  . Erectile dysfunction   . Genital warts   . HIV (human immunodeficiency virus infection) ([place]) 2009   CD4 count 100, VL 13800 (05/01/2010)  . Hypertension   . Osteomyelitis ([place])    h/o hand  . Osteomyelitis of metatarsal ([place]) 04/28/2017   Current Status: Since his last office visit, he states that he has been having diarrhea X 2 days now. He is currently having 2-3 running stools a day.  No reports of GI problems such as nausea, vomiting, and constipation. He has no reports of blood in stools, dysuria and hematuria. Marland Kitchen He states that he currently does not have blood glucose meter to check his blood levels. He denies fatigue, frequent urination, blurred vision, excessive hunger, excessive thirst, weight gain, weight loss, and poor wound healing. He continues to check his feet regularly. He continues to follow up with Infection Disease as needed. His last appointment was recently. Today, his anxiety is mild today. He denies suicidal ideations, homicidal ideations, or auditory hallucinations. He denies fevers, chills,  recent infections, weight loss, and night sweats. He has not had any headaches, dizziness, and falls. No chest pain, heart palpitations, cough and shortness  of breath reported. His chronic back pain, which he continues to follow up with Pain Clinic as needed.   Past Surgical History:  Procedure Laterality Date  . AMPUTATION Right 05/02/2017   Procedure: AMPUTATION TRANSMETARSAL;  Surgeon: [name], MD;  Location: [facility];  Service: Orthopedics;  Laterality: Right;  . AMPUTATION Right 06/13/2017   Procedure: RIGHT BELOW KNEE AMPUTATION;  Surgeon: [name], MD;  Location: [facility];  Service: Orthopedics;  Laterality: Right;  . BELOW KNEE LEG AMPUTATION Right 06/13/2017  . BELOW KNEE LEG AMPUTATION    . CORONARY STENT INTERVENTION N/A 11/10/2018   Procedure: CORONARY STENT INTERVENTION;  Surgeon: Leonie Man, MD;  Location: [facility];  Service: Cardiovascular;  Laterality: N/A;  . HERNIA REPAIR    . I&D EXTREMITY Left 08/21/2014   Procedure: INCISION AND DRAINAGE LEFT SMALL FINGER;  Surgeon: Leanora Cover, MD;  Location: [facility];  Service: Orthopedics;  Laterality: Left;  . I&D EXTREMITY Right 03/18/2017   Procedure: IRRIGATION AND DEBRIDEMENT EXTREMITY;  Surgeon: [name], MD;  Location: [facility];  Service: Orthopedics;  Laterality: Right;  . LEFT HEART CATH AND CORONARY ANGIOGRAPHY N/A 11/10/2018   Procedure: LEFT HEART CATH AND CORONARY ANGIOGRAPHY;  Surgeon: Leonie Man, MD;  Location: [facility];  Service: Cardiovascular;  Laterality: N/A;  . MINOR IRRIGATION AND DEBRIDEMENT OF WOUND Right 04/22/2014   Procedure: IRRIGATION  AND DEBRIDEMENT OF RIGHT NECK ABCESS;  Surgeon: Jerrell Belfast, MD;  Location: [facility];  Service: ENT;  Laterality: Right;  . MULTIPLE EXTRACTIONS WITH ALVEOLOPLASTY N/A 01/18/2013   Procedure: MULTIPLE EXTRACION 3, 6, 7, 10, 11, 13, 21, 22, 27, 28, 29, 30 WITH ALVEOLOPLASTY;  Surgeon: Gae Bon, DDS;  Location: [facility];  Service: Oral Surgery;  Laterality: N/A;  . SKIN SPLIT GRAFT Right 03/21/2017   Procedure: IRRIGATION AND DEBRIDEMENT RIGHT FOOT AND APPLY SPLIT THICKNESS SKIN GRAFT AND WOUND VAC;   Surgeon: [name], MD;  Location: [facility];  Service: Orthopedics;  Laterality: Right;  . TEE WITHOUT CARDIOVERSION N/A 05/02/2017   Procedure: TRANSESOPHAGEAL ECHOCARDIOGRAM (TEE);  Surgeon: Acie Fredrickson Wonda Cheng, MD;  Location: [facility] OR;  Service: Cardiovascular;  Laterality: N/A;  coincidental to orthopedic case    Family History  Problem Relation Age of Onset  . Hypertension Mother   . Arthritis Father   . Hypertension Father   . Hypertension Brother   . Cancer Maternal Grandmother 57       unknown type of cancer  . Depression Paternal Grandmother     Social History   Socioeconomic History  . Marital status: Single    Spouse name: Not on file  . Number of children: 3  . Years of education: 31  . Highest education level: Not on file  Occupational History  . Occupation: disabled  Social Needs  . Financial resource strain: Not on file  . Food insecurity    Worry: Sometimes true    Inability: Sometimes true  . Transportation needs    Medical: No    Non-medical: No  Tobacco Use  . Smoking status: Never Smoker  . Smokeless tobacco: Never Used  Substance and Sexual Activity  . Alcohol use: Never    Frequency: Never  . Drug use: Never    Comment: OTC ASA  . Sexual activity: Yes    Partners: Female    Comment: given condoms  Lifestyle  . Physical activity    Days per week: Not on file    Minutes per session: Not on file  . Stress: Not on file  Relationships  . Social Herbalist on phone: Not on file    Gets together: Not on file    Attends religious service: Not on file    Active member of club or organization: Not on file    Attends meetings of clubs or organizations: Not on file    Relationship status: Not on file  . Intimate partner violence    Fear of current or ex partner: Not on file    Emotionally abused: Not on file    Physically abused: Not on file    Forced sexual activity: Not on file  Other Topics Concern  . Not on file  Social History  Narrative   ** Merged History Encounter **       Worked for the city of [place] for 18 years.   Unemployed.    Applying for disability.   Medicaid patient.    Allergies  Allergen Reactions  . Sulfur Itching    Patient stated he's allergic to "sulfur" AND "sulfa"  . Sulfa Antibiotics Itching    ROS Review of Systems  Constitutional: Negative.   HENT: Negative.   Eyes: Negative.   Respiratory: Negative.   Cardiovascular: Negative.   Gastrointestinal: Positive for abdominal distention (obese).  Endocrine: Negative.   Genitourinary: Positive for difficulty urinating.  Musculoskeletal: Positive for  arthralgias (generalized back and joint pain).       Right BKA  Skin: Negative.   Allergic/Immunologic: Negative.   Neurological: Negative.   Hematological: Negative.   Psychiatric/Behavioral: Negative.    Objective:    Physical Exam  Constitutional: He is oriented to person, place, and time. He appears well-developed and well-nourished.  HENT:  Head: Normocephalic and atraumatic.  Right Ear: External ear normal.  Left Ear: External ear normal.  Mouth/Throat: Oropharynx is clear and moist.  Eyes: Conjunctivae are normal.  Neck: Normal range of motion. Neck supple.  Cardiovascular: Normal rate, regular rhythm, normal heart sounds and intact distal pulses.  Pulmonary/Chest: Effort normal and breath sounds normal.  Abdominal: Soft. Bowel sounds are normal.  Musculoskeletal: Normal range of motion.  Neurological: He is alert and oriented to person, place, and time. He has normal reflexes.  Skin: Skin is warm and dry.  Psychiatric: He has a normal mood and affect. His behavior is normal. Judgment and thought content normal.    BP (!) 142/80 (BP Location: Right Arm, Patient Position: Sitting, Cuff Size: Large)   Pulse 64   Temp (!) 97.4 F (36.3 C) (Oral)   Ht '5\' 10"'$  (1.778 m)   Wt 263 lb 3.2 oz (119.4 kg)   SpO2 100%   BMI 37.77 kg/m  Wt Readings from Last 3  Encounters:  01/13/19 263 lb 3.2 oz (119.4 kg)  11/13/18 251 lb 12.3 oz (114.2 kg)  11/06/18 246 lb (111.6 kg)     Health Maintenance Due  Topic Date Due  . OPHTHALMOLOGY EXAM  06/23/2012  . COLONOSCOPY  11/18/2014  . FOOT EXAM  02/23/2015  . URINE MICROALBUMIN  02/23/2015  . INFLUENZA VACCINE  11/07/2018    There are no preventive care reminders to display for this patient.  Lab Results  Component Value Date   TSH 1.633 11/10/2018   Lab Results  Component Value Date   WBC 6.4 11/13/2018   HGB 9.3 (L) 11/13/2018   HCT 30.2 (L) 11/13/2018   MCV 83.9 11/13/2018   PLT 341 11/13/2018   Lab Results  Component Value Date   NA 136 11/11/2018   K 3.7 11/11/2018   CO2 20 (L) 11/11/2018   GLUCOSE 187 (H) 11/11/2018   BUN 17 11/11/2018   CREATININE 1.21 11/11/2018   BILITOT 0.7 11/10/2018   ALKPHOS 79 11/10/2018   AST 135 (H) 11/10/2018   ALT 32 11/10/2018   PROT 7.7 11/10/2018   ALBUMIN 2.8 (L) 11/10/2018   CALCIUM 8.9 11/11/2018   ANIONGAP 12 11/11/2018   Lab Results  Component Value Date   CHOL 234 (H) 11/11/2018   Lab Results  Component Value Date   HDL 36 (L) 11/11/2018   Lab Results  Component Value Date   LDLCALC 171 (H) 11/11/2018   Lab Results  Component Value Date   TRIG 134 11/11/2018   Lab Results  Component Value Date   CHOLHDL 6.5 11/11/2018   Lab Results  Component Value Date   HGBA1C 8.1 (A) 01/13/2019   Assessment & Plan:   1. Uncontrolled type 2 diabetes mellitus with hyperglycemia (HCC) - POCT urinalysis dipstick - blood glucose meter kit and supplies; Dispense based on patient and insurance preference. Use up to four times daily as directed. (FOR ICD-10 E10.9, E11.9). (Patient not taking: Reported on 01/13/2019)  Dispense: 1 each; Refill: 0  2. Type 2 diabetes mellitus with other specified complication, with long-term current use of insulin (HCC) We will resend blood glucose  monitor today.  - blood glucose meter kit and supplies;  Dispense based on patient and insurance preference. Use up to four times daily as directed. (FOR ICD-10 E10.9, E11.9). (Patient not taking: Reported on 01/13/2019)  Dispense: 1 each; Refill: 0  3. Elevated glucose - POCT glycosylated hemoglobin (Hb A1C) - POCT glucose (manual entry) - blood glucose meter kit and supplies; Dispense based on patient and insurance preference. Use up to four times daily as directed. (FOR ICD-10 E10.9, E11.9). (Patient not taking: Reported on 01/13/2019)  Dispense: 1 each; Refill: 0  4. Hemoglobin A1C between 7% and 9% indicating borderline diabetic control Much improved. Hgb A1c at 8.1 today, from >15.5 on 09/07/2018. He will continue medication as prescribed, to decrease foods/beverages high in sugars and carbs and follow Heart Healthy or DASH diet. Increase physical activity to at least 30 minutes cardio exercise daily.   5. Hx of BKA, right ([place]) Stable. No complaints. He continues to use prosthetic.   6. HIV disease ([place]) Continue to follow  Up with Infection Disease.   7. Chronic low back pain without sciatica, unspecified back pain laterality  8. Under care of pain management specialists He will continue to follow up with Pain Clinic as needed.   9. Diarrhea, unspecified type We will initiate Loperamide today. - loperamide (IMODIUM A-D) 2 MG tablet; Take 1 tablet (2 mg total) by mouth 4 (four) times daily as needed for diarrhea or loose stools.  Dispense: 30 tablet; Refill: 0  10. Follow up He will follow up in 3 months.   Meds ordered this encounter  Medications  . blood glucose meter kit and supplies    Sig: Dispense based on patient and insurance preference. Use up to four times daily as directed. (FOR ICD-10 E10.9, E11.9).    Dispense:  1 each    Refill:  0    Order Specific Question:   Number of strips    Answer:   100    Order Specific Question:   Number of lancets    Answer:   100  . loperamide (IMODIUM A-D) 2 MG tablet    Sig: Take 1  tablet (2 mg total) by mouth 4 (four) times daily as needed for diarrhea or loose stools.    Dispense:  30 tablet    Refill:  0    Orders Placed This Encounter  Procedures  . POCT urinalysis dipstick  . POCT glycosylated hemoglobin (Hb A1C)  . POCT glucose (manual entry)   Referral Orders  No referral(s) requested today    Kathe Becton,  MSN, FNP-BC [place], [place] [address] [phone]- fax    Problem List Items Addressed This Visit      Other   Chronic low back pain without sciatica   HIV disease ([place]) (Chronic)    Other Visit Diagnoses    Uncontrolled type 2 diabetes mellitus with hyperglycemia ([place])    -  Primary   Relevant Medications   blood glucose meter kit and supplies   Other Relevant Orders   POCT urinalysis dipstick   Type 2 diabetes mellitus with other specified complication, with long-term current use of insulin (HCC)       Relevant Medications   blood glucose meter kit and supplies   Elevated glucose       Relevant Medications   blood glucose meter kit and supplies   Other Relevant Orders   POCT  glycosylated hemoglobin (Hb A1C) (Completed)   POCT glucose (manual entry) (Completed)   Hemoglobin A1C between 7% and 9% indicating borderline diabetic control       Hx of BKA, right (HCC)       Diarrhea, unspecified type       Relevant Medications   loperamide (IMODIUM A-D) 2 MG tablet   Follow up          Meds ordered this encounter  Medications  . blood glucose meter kit and supplies    Sig: Dispense based on patient and insurance preference. Use up to four times daily as directed. (FOR ICD-10 E10.9, E11.9).    Dispense:  1 each    Refill:  0    Order Specific Question:   Number of strips    Answer:   100    Order Specific Question:   Number of lancets    Answer:   100  . loperamide (IMODIUM A-D) 2 MG tablet    Sig: Take 1 tablet (2  mg total) by mouth 4 (four) times daily as needed for diarrhea or loose stools.    Dispense:  30 tablet    Refill:  0    Follow-up: Return in about 3 months (around 04/15/2019).    [name], FNP

## 2019-01-14 DIAGNOSIS — R7303 Prediabetes: Secondary | ICD-10-CM | POA: Insufficient documentation

## 2019-01-14 DIAGNOSIS — E1165 Type 2 diabetes mellitus with hyperglycemia: Secondary | ICD-10-CM | POA: Insufficient documentation

## 2019-01-14 DIAGNOSIS — R7309 Other abnormal glucose: Secondary | ICD-10-CM | POA: Insufficient documentation

## 2019-01-14 DIAGNOSIS — Z89511 Acquired absence of right leg below knee: Secondary | ICD-10-CM | POA: Insufficient documentation

## 2019-01-14 DIAGNOSIS — E119 Type 2 diabetes mellitus without complications: Secondary | ICD-10-CM | POA: Insufficient documentation

## 2019-01-15 DIAGNOSIS — Z79899 Other long term (current) drug therapy: Secondary | ICD-10-CM | POA: Diagnosis not present

## 2019-01-15 DIAGNOSIS — M545 Low back pain: Secondary | ICD-10-CM | POA: Diagnosis not present

## 2019-01-15 DIAGNOSIS — Z7189 Other specified counseling: Secondary | ICD-10-CM | POA: Diagnosis not present

## 2019-01-19 ENCOUNTER — Ambulatory Visit (INDEPENDENT_AMBULATORY_CARE_PROVIDER_SITE_OTHER): Payer: Medicare Other | Admitting: Orthopedic Surgery

## 2019-01-19 ENCOUNTER — Other Ambulatory Visit: Payer: Self-pay

## 2019-01-19 DIAGNOSIS — I249 Acute ischemic heart disease, unspecified: Secondary | ICD-10-CM

## 2019-01-19 DIAGNOSIS — Z89511 Acquired absence of right leg below knee: Secondary | ICD-10-CM

## 2019-01-19 DIAGNOSIS — L97211 Non-pressure chronic ulcer of right calf limited to breakdown of skin: Secondary | ICD-10-CM

## 2019-01-25 ENCOUNTER — Telehealth: Payer: Self-pay

## 2019-01-26 NOTE — Telephone Encounter (Signed)
error 

## 2019-02-03 ENCOUNTER — Encounter: Payer: Self-pay | Admitting: Orthopedic Surgery

## 2019-02-03 NOTE — Progress Notes (Signed)
Office Visit Note   Patient: Keith [name]           Date of Birth: Aug 02, 1965           MRN: [id number] Visit Date: 01/19/2019              Requested by: No referring provider defined for this encounter. PCP: System, Pcp Not In  Chief Complaint  Patient presents with  . Right Leg - Follow-up      HPI: Patient is a 53 year old gentleman who presents in follow-up for transtibial amputation.  Patient states that he needs a nurse and home physical therapy to come out to his home.  Patient states he just has had a new leg fabricated by [employer].  Patient states that he is sleeping with his leg on and does not have transportation.  Assessment & Plan: Visit Diagnoses:  1. Non-pressure chronic ulcer of right calf limited to breakdown of skin ([place])   2. Acquired absence of right leg below knee (HCC)     Plan: Discussed with the patient importance of wearing his black shrinker beneath the silicone liner he needs to wash the silicone liner daily with soap and water wash the stump shrinker daily with soap and water and keep his leg dry.   Follow-Up Instructions: Return in about 4 weeks (around 02/16/2019).   Ortho Exam  Patient is alert, oriented, no adenopathy, well-dressed, normal affect, normal respiratory effort. Examination of the sock is removed with removing the silicone liner patient has weeping fluid on the silicone liner with maceration of the leg and a foul smell.  Patient states he is wearing the leg and liner 24 hours a day.  There is no open wounds no cellulitis.  Imaging: No results found. No images are attached to the encounter.  Labs: Lab Results  Component Value Date   HGBA1C 8.1 (A) 01/13/2019   HGBA1C >15.5 (H) 09/07/2018   HGBA1C 15.0 (H) 08/07/2018   ESRSEDRATE 130 (H) 11/02/2018   ESRSEDRATE 140 (H) 10/30/2018   ESRSEDRATE 74 (H) 06/06/2018   CRP <0.8 11/02/2018   CRP 2.0 (H) 10/30/2018   CRP <0.8 06/06/2018   LABURIC 6.3 08/07/2009   REPTSTATUS  11/08/2018 FINAL 11/03/2018   REPTSTATUS 11/08/2018 FINAL 11/03/2018   GRAMSTAIN  04/29/2017    NO WBC SEEN RARE GRAM POSITIVE COCCI IN PAIRS IN SINGLES    CULT  11/03/2018    NO GROWTH 5 DAYS Performed at [facility], [place] [address], [place], [place] [address]    CULT  11/03/2018    NO GROWTH 5 DAYS Performed at [facility] [address] [place] [address], [place], [place] [address]    LABORGA METHICILLIN RESISTANT STAPHYLOCOCCUS AUREUS 04/29/2017     Lab Results  Component Value Date   ALBUMIN 2.8 (L) 11/10/2018   ALBUMIN 2.8 (L) 10/25/2018   ALBUMIN 2.9 (L) 10/11/2018   PREALBUMIN 25.5 06/06/2018   PREALBUMIN 7.4 (L) 03/15/2017   LABURIC 6.3 08/07/2009    Lab Results  Component Value Date   MG 2.2 08/08/2018   MG 1.7 04/28/2017   MG 2.0 01/08/2017   Lab Results  Component Value Date   VD25OH 8.2 (L) 09/07/2018    Lab Results  Component Value Date   PREALBUMIN 25.5 06/06/2018   PREALBUMIN 7.4 (L) 03/15/2017   CBC EXTENDED Latest Ref Rng & Units 11/13/2018 11/12/2018 11/11/2018  WBC 4.0 - 10.5 K/uL 6.4 8.8 6.9  RBC 4.22 - 5.81 MIL/uL 3.60(L) 3.45(L) 3.94(L)  HGB 13.0 - 17.0 g/dL 9.3(L) 8.8(L) 10.1(L)  HCT 39.0 - 52.0 % 30.2(L) 28.3(L) 32.7(L)  PLT 150 - 400 K/uL 341 342 379  NEUTROABS 1.7 - 7.7 K/uL - - -  LYMPHSABS 0.7 - 4.0 K/uL - - -     There is no height or weight on file to calculate BMI.  Orders:  No orders of the defined types were placed in this encounter.  No orders of the defined types were placed in this encounter.    Procedures: No procedures performed  Clinical Data: No additional findings.  ROS:  All other systems negative, except as noted in the HPI. Review of Systems  Objective: Vital Signs: There were no vitals taken for this visit.  Specialty Comments:  No specialty comments available.  PMFS History: Patient Active Problem List   Diagnosis Date Noted  . Uncontrolled type 2 diabetes mellitus with hyperglycemia ([place])  01/14/2019  . Elevated glucose 01/14/2019  . Hemoglobin A1C between 7% and 9% indicating borderline diabetic control 01/14/2019  . Hx of BKA, right ([place]) 01/14/2019  . Pressure injury of skin 11/11/2018  . Acute myocardial infarction ([place]) 11/10/2018  . CKD (chronic kidney disease), stage II 11/10/2018  . Amputation stump infection ([place]) 10/28/2018  . Type II diabetes mellitus with renal manifestations ([place]) 08/07/2018  . Uncontrolled type 2 diabetes mellitus with hyperosmolar nonketotic hyperglycemia ([place]) 08/07/2018  . Non-pressure chronic ulcer of right calf limited to breakdown of skin ([place]) 07/06/2018  . Type 2 diabetes mellitus without complication, without long-term current use of insulin ([place]) 06/15/2018  . Chronic low back pain without sciatica 06/15/2018  . Idiopathic chronic venous hypertension of left lower extremity with ulcer and inflammation ([place])   . Venous stasis ulcer of left calf limited to breakdown of skin without varicose veins ([place]) 04/23/2018  . Acquired absence of right leg below knee ([place]) 06/13/2017  . Acute renal failure superimposed on stage 3 chronic kidney disease ([place]) 03/15/2017  . Diabetic polyneuropathy associated with type 2 diabetes mellitus ([place]) 01/23/2017  . AKI (acute kidney injury) ([place]) 09/28/2016  . Nausea vomiting and diarrhea 09/28/2016  . Onychomycosis of multiple toenails with type 2 diabetes mellitus ([place]) 08/29/2015  . MRSA carrier 04/20/2014  . Penile wart 02/22/2014  . HIV disease ([place])   . Insulin-requiring or dependent type II diabetes mellitus ([place]) 02/04/2014  . Dental anomaly 11/20/2012  . Arthritis of right knee 02/23/2012  . Hyperlipidemia 11/10/2011  . Chronic pain 08/07/2011  . Meralgia paraesthetica 04/23/2011  . ERECTILE DYSFUNCTION 08/22/2008  . Essential hypertension 05/19/2008   Past Medical History:  Diagnosis Date  . Abscess of right foot    abscess/ulcer of R transtibial amputation requiring IV abx  . AIDS ([place])    . Chronic anemia   . Chronic knee pain    right  . Chronic pain   . CKD (chronic kidney disease), stage II   . Diabetes type 2, uncontrolled ([place])    HgA1c 17.6 (04/27/2010)  . Diabetic foot ulcer ([place]) 01/2017   right foot  . Erectile dysfunction   . Genital warts   . HIV (human immunodeficiency virus infection) ([place]) 2009   CD4 count 100, VL 13800 (05/01/2010)  . Hypertension   . Osteomyelitis ([place])    h/o hand  . Osteomyelitis of metatarsal ([place]) 04/28/2017    Family History  Problem Relation Age of Onset  . Hypertension Mother   . Arthritis Father   . Hypertension Father   . Hypertension Brother   .  Cancer Maternal Grandmother 32       unknown type of cancer  . Depression Paternal Grandmother     Past Surgical History:  Procedure Laterality Date  . AMPUTATION Right 05/02/2017   Procedure: AMPUTATION TRANSMETARSAL;  Surgeon: [name], MD;  Location: [facility];  Service: Orthopedics;  Laterality: Right;  . AMPUTATION Right 06/13/2017   Procedure: RIGHT BELOW KNEE AMPUTATION;  Surgeon: [name], MD;  Location: [facility];  Service: Orthopedics;  Laterality: Right;  . BELOW KNEE LEG AMPUTATION Right 06/13/2017  . BELOW KNEE LEG AMPUTATION    . CORONARY STENT INTERVENTION N/A 11/10/2018   Procedure: CORONARY STENT INTERVENTION;  Surgeon: Leonie Man, MD;  Location: [facility];  Service: Cardiovascular;  Laterality: N/A;  . HERNIA REPAIR    . I&D EXTREMITY Left 08/21/2014   Procedure: INCISION AND DRAINAGE LEFT SMALL FINGER;  Surgeon: Leanora Cover, MD;  Location: [facility];  Service: Orthopedics;  Laterality: Left;  . I&D EXTREMITY Right 03/18/2017   Procedure: IRRIGATION AND DEBRIDEMENT EXTREMITY;  Surgeon: [name], MD;  Location: [place] [facility];  Service: Orthopedics;  Laterality: Right;  . LEFT HEART CATH AND CORONARY ANGIOGRAPHY N/A 11/10/2018   Procedure: LEFT HEART CATH AND CORONARY ANGIOGRAPHY;  Surgeon: Leonie Man, MD;  Location: [facility];   Service: Cardiovascular;  Laterality: N/A;  . MINOR IRRIGATION AND DEBRIDEMENT OF WOUND Right 04/22/2014   Procedure: IRRIGATION AND DEBRIDEMENT OF RIGHT NECK ABCESS;  Surgeon: Jerrell Belfast, MD;  Location: [facility];  Service: ENT;  Laterality: Right;  . MULTIPLE EXTRACTIONS WITH ALVEOLOPLASTY N/A 01/18/2013   Procedure: MULTIPLE EXTRACION 3, 6, 7, 10, 11, 13, 21, 22, 27, 28, 29, 30 WITH ALVEOLOPLASTY;  Surgeon: Gae Bon, DDS;  Location: [facility];  Service: Oral Surgery;  Laterality: N/A;  . SKIN SPLIT GRAFT Right 03/21/2017   Procedure: IRRIGATION AND DEBRIDEMENT RIGHT FOOT AND APPLY SPLIT THICKNESS SKIN GRAFT AND WOUND VAC;  Surgeon: [name], MD;  Location: [facility];  Service: Orthopedics;  Laterality: Right;  . TEE WITHOUT CARDIOVERSION N/A 05/02/2017   Procedure: TRANSESOPHAGEAL ECHOCARDIOGRAM (TEE);  Surgeon: Acie Fredrickson Wonda Cheng, MD;  Location: [facility];  Service: Cardiovascular;  Laterality: N/A;  coincidental to orthopedic case   Social History   Occupational History  . Occupation: disabled  Tobacco Use  . Smoking status: Never Smoker  . Smokeless tobacco: Never Used  Substance and Sexual Activity  . Alcohol use: Never    Frequency: Never  . Drug use: Never    Comment: OTC ASA  . Sexual activity: Yes    Partners: Female    Comment: given condoms

## 2019-02-07 ENCOUNTER — Emergency Department (HOSPITAL_COMMUNITY)
Admission: EM | Admit: 2019-02-07 | Discharge: 2019-02-07 | Disposition: A | Discharge status: Home / Self Care | Payer: Medicare Other | Attending: Emergency Medicine | Admitting: Emergency Medicine

## 2019-02-07 ENCOUNTER — Other Ambulatory Visit: Payer: Self-pay

## 2019-02-07 ENCOUNTER — Encounter (HOSPITAL_COMMUNITY): Payer: Self-pay | Admitting: Emergency Medicine

## 2019-02-07 DIAGNOSIS — E1122 Type 2 diabetes mellitus with diabetic chronic kidney disease: Secondary | ICD-10-CM | POA: Insufficient documentation

## 2019-02-07 DIAGNOSIS — E1165 Type 2 diabetes mellitus with hyperglycemia: Secondary | ICD-10-CM | POA: Diagnosis present

## 2019-02-07 DIAGNOSIS — Z794 Long term (current) use of insulin: Secondary | ICD-10-CM | POA: Insufficient documentation

## 2019-02-07 DIAGNOSIS — Z21 Asymptomatic human immunodeficiency virus [HIV] infection status: Secondary | ICD-10-CM | POA: Insufficient documentation

## 2019-02-07 DIAGNOSIS — Z760 Encounter for issue of repeat prescription: Secondary | ICD-10-CM | POA: Insufficient documentation

## 2019-02-07 DIAGNOSIS — Z79899 Other long term (current) drug therapy: Secondary | ICD-10-CM | POA: Insufficient documentation

## 2019-02-07 DIAGNOSIS — N183 Chronic kidney disease, stage 3 unspecified: Secondary | ICD-10-CM | POA: Diagnosis not present

## 2019-02-07 DIAGNOSIS — Z7982 Long term (current) use of aspirin: Secondary | ICD-10-CM | POA: Insufficient documentation

## 2019-02-07 DIAGNOSIS — I252 Old myocardial infarction: Secondary | ICD-10-CM | POA: Diagnosis not present

## 2019-02-07 DIAGNOSIS — I129 Hypertensive chronic kidney disease with stage 1 through stage 4 chronic kidney disease, or unspecified chronic kidney disease: Secondary | ICD-10-CM | POA: Insufficient documentation

## 2019-02-07 DIAGNOSIS — E119 Type 2 diabetes mellitus without complications: Secondary | ICD-10-CM

## 2019-02-07 DIAGNOSIS — I10 Essential (primary) hypertension: Secondary | ICD-10-CM | POA: Diagnosis not present

## 2019-02-07 DIAGNOSIS — Z89511 Acquired absence of right leg below knee: Secondary | ICD-10-CM | POA: Insufficient documentation

## 2019-02-07 DIAGNOSIS — N179 Acute kidney failure, unspecified: Secondary | ICD-10-CM | POA: Diagnosis not present

## 2019-02-07 LAB — CBC WITH DIFFERENTIAL/PLATELET
Abs Immature Granulocytes: 0.01 10*3/uL (ref 0.00–0.07)
Basophils Absolute: 0 10*3/uL (ref 0.0–0.1)
Basophils Relative: 0 %
Eosinophils Absolute: 0.1 10*3/uL (ref 0.0–0.5)
Eosinophils Relative: 2 %
HCT: 36 % — ABNORMAL LOW (ref 39.0–52.0)
Hemoglobin: 11.2 g/dL — ABNORMAL LOW (ref 13.0–17.0)
Immature Granulocytes: 0 %
Lymphocytes Relative: 46 %
Lymphs Abs: 2.4 10*3/uL (ref 0.7–4.0)
MCH: 26.1 pg (ref 26.0–34.0)
MCHC: 31.1 g/dL (ref 30.0–36.0)
MCV: 83.9 fL (ref 80.0–100.0)
Monocytes Absolute: 0.6 10*3/uL (ref 0.1–1.0)
Monocytes Relative: 10 %
Neutro Abs: 2.3 10*3/uL (ref 1.7–7.7)
Neutrophils Relative %: 42 %
Platelets: 239 10*3/uL (ref 150–400)
RBC: 4.29 MIL/uL (ref 4.22–5.81)
RDW: 14.5 % (ref 11.5–15.5)
WBC: 5.4 10*3/uL (ref 4.0–10.5)
nRBC: 0 % (ref 0.0–0.2)

## 2019-02-07 LAB — URINALYSIS, ROUTINE W REFLEX MICROSCOPIC
Bacteria, UA: NONE SEEN
Bilirubin Urine: NEGATIVE
Glucose, UA: NEGATIVE mg/dL
Ketones, ur: NEGATIVE mg/dL
Leukocytes,Ua: NEGATIVE
Nitrite: NEGATIVE
Protein, ur: 100 mg/dL — AB
Specific Gravity, Urine: 1.01 (ref 1.005–1.030)
pH: 7 (ref 5.0–8.0)

## 2019-02-07 LAB — COMPREHENSIVE METABOLIC PANEL
ALT: 30 U/L (ref 0–44)
AST: 36 U/L (ref 15–41)
Albumin: 3.6 g/dL (ref 3.5–5.0)
Alkaline Phosphatase: 64 U/L (ref 38–126)
Anion gap: 9 (ref 5–15)
BUN: 23 mg/dL — ABNORMAL HIGH (ref 6–20)
CO2: 22 mmol/L (ref 22–32)
Calcium: 9.2 mg/dL (ref 8.9–10.3)
Chloride: 107 mmol/L (ref 98–111)
Creatinine, Ser: 1.76 mg/dL — ABNORMAL HIGH (ref 0.61–1.24)
GFR calc Af Amer: 50 mL/min — ABNORMAL LOW (ref >60)
GFR calc non Af Amer: 43 mL/min — ABNORMAL LOW (ref >60)
Glucose, Bld: 102 mg/dL — ABNORMAL HIGH (ref 70–99)
Potassium: 4.1 mmol/L (ref 3.5–5.1)
Sodium: 138 mmol/L (ref 135–145)
Total Bilirubin: 0.6 mg/dL (ref 0.3–1.2)
Total Protein: 8.3 g/dL — ABNORMAL HIGH (ref 6.5–8.1)

## 2019-02-07 LAB — CBG MONITORING, ED: Glucose-Capillary: 104 mg/dL — ABNORMAL HIGH (ref 70–99)

## 2019-02-07 MED ORDER — AMLODIPINE BESYLATE 5 MG PO TABS
10.0000 mg | ORAL_TABLET | Freq: Once | ORAL | Status: AC
  Administered 2019-02-07: 10 mg via ORAL
  Filled 2019-02-07: qty 2

## 2019-02-07 MED ORDER — CARVEDILOL 12.5 MG PO TABS
25.0000 mg | ORAL_TABLET | Freq: Two times a day (BID) | ORAL | Status: DC
  Administered 2019-02-07: 25 mg via ORAL
  Filled 2019-02-07: qty 2

## 2019-02-07 MED ORDER — SODIUM CHLORIDE 0.9 % IV BOLUS
1000.0000 mL | Freq: Once | INTRAVENOUS | Status: AC
  Administered 2019-02-07: 1000 mL via INTRAVENOUS

## 2019-02-07 MED ORDER — AMLODIPINE BESYLATE 10 MG PO TABS: 10.0000 mg | ORAL_TABLET | Freq: Every day | ORAL | 0 refills | Status: DC

## 2019-02-07 MED ORDER — COOL BLOOD GLUCOSE TEST STRIPS VI STRP: ORAL_STRIP | 0 refills | Status: DC

## 2019-02-07 NOTE — ED Triage Notes (Signed)
Patient reports hyperglycemia with urinary frequency , hypertensive at arrival , patient stated he ran out of testing strips this week . CBG= 104 at triage .

## 2019-02-07 NOTE — Discharge Instructions (Signed)
Please take your blood pressure medication daily, you will need to follow-up with your primary care doctor for additional refills of this medication, and I would like for you to have your kidney function rechecked in 1 week, it was elevated from your baseline today.  This is likely due to elevated blood pressures, make sure you are drinking plenty of water as well.  Continue taking your insulin regularly.  Return to the ED for any new or worsening symptoms.

## 2019-02-07 NOTE — ED Provider Notes (Signed)
[facility] EMERGENCY DEPARTMENT Provider Note   CSN: [id number] Arrival date & time: 02/07/19  0444     History   Chief Complaint Chief Complaint  Patient presents with  . Hyperglycemia    HPI Keith [name] is a 53 y.o. male.     Keith [name] is a 53 y.o. male with a history of hypertension, diabetes, CKD, HIV, chronic anemia, right BKA, who presents to the ED with concern for hyperglycemia.  Patient reports that he ran out of his test strips this week and is worried his sugar might be getting high, he is continue to take his regular insulin dosing.  Glucose noted to be 104 on arrival, but patient noted to be hypertensive at 175/112.  He reports that he ran out of his blood pressure medication, amlodipine 10 mg about 1 month ago, he continues to take his Coreg insulin and other medications regularly.  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  He denies any associated chest pain or shortness of breath, no lower extremity swelling.  No headaches or vision changes, no numbness weakness or tingling.  He has not had any vomiting, diarrhea, or abdominal pain.  No fevers or chills.  No aggravating or alleviating factors.     Past Medical History:  Diagnosis Date  . Abscess of right foot    abscess/ulcer of R transtibial amputation requiring IV abx  . AIDS ([place])   . Chronic anemia   . Chronic knee pain    right  . Chronic pain   . CKD (chronic kidney disease), stage II   . Diabetes type 2, uncontrolled ([place])    HgA1c 17.6 (04/27/2010)  . Diabetic foot ulcer ([place]) 01/2017   right foot  . Erectile dysfunction   . Genital warts   . HIV (human immunodeficiency virus infection) ([place]) 2009   CD4 count 100, VL 13800 (05/01/2010)  . Hypertension   . Osteomyelitis ([place])    h/o hand  . Osteomyelitis of metatarsal ([place]) 04/28/2017    Patient Active Problem  List   Diagnosis Date Noted  . Uncontrolled type 2 diabetes mellitus with hyperglycemia ([place]) 01/14/2019  . Elevated glucose 01/14/2019  . Hemoglobin A1C between 7% and 9% indicating borderline diabetic control 01/14/2019  . Hx of BKA, right ([place]) 01/14/2019  . Pressure injury of skin 11/11/2018  . Acute myocardial infarction ([place]) 11/10/2018  . CKD (chronic kidney disease), stage II 11/10/2018  . Amputation stump infection ([place]) 10/28/2018  . Type II diabetes mellitus with renal manifestations ([place]) 08/07/2018  . Uncontrolled type 2 diabetes mellitus with hyperosmolar nonketotic hyperglycemia ([place]) 08/07/2018  . Non-pressure chronic ulcer of right calf limited to breakdown of skin ([place]) 07/06/2018  . Type 2 diabetes mellitus without complication, without long-term current use of insulin ([place]) 06/15/2018  . Chronic low back pain without sciatica 06/15/2018  . Idiopathic chronic venous hypertension of left lower extremity with ulcer and inflammation ([place])   . Venous stasis ulcer of left calf limited to breakdown of skin without varicose veins ([place]) 04/23/2018  . Acquired absence of right leg below knee ([place]) 06/13/2017  . Acute renal failure superimposed on stage 3 chronic kidney disease ([place]) 03/15/2017  . Diabetic polyneuropathy associated with type 2 diabetes mellitus ([place]) 01/23/2017  . AKI (acute kidney injury) ([place]) 09/28/2016  . Nausea vomiting and diarrhea 09/28/2016  . Onychomycosis  of multiple toenails with type 2 diabetes mellitus ([place]) 08/29/2015  . MRSA carrier 04/20/2014  . Penile wart 02/22/2014  . HIV disease ([place])   . Insulin-requiring or dependent type II diabetes mellitus ([place]) 02/04/2014  . Dental anomaly 11/20/2012  . Arthritis of right knee 02/23/2012  . Hyperlipidemia 11/10/2011  . Chronic pain 08/07/2011  . Meralgia paraesthetica 04/23/2011  . ERECTILE DYSFUNCTION 08/22/2008  . Essential hypertension 05/19/2008    Past Surgical History:  Procedure  Laterality Date  . AMPUTATION Right 05/02/2017   Procedure: AMPUTATION TRANSMETARSAL;  Surgeon: [name], MD;  Location: [facility];  Service: Orthopedics;  Laterality: Right;  . AMPUTATION Right 06/13/2017   Procedure: RIGHT BELOW KNEE AMPUTATION;  Surgeon: [name], MD;  Location: [facility];  Service: Orthopedics;  Laterality: Right;  . BELOW KNEE LEG AMPUTATION Right 06/13/2017  . BELOW KNEE LEG AMPUTATION    . CORONARY STENT INTERVENTION N/A 11/10/2018   Procedure: CORONARY STENT INTERVENTION;  Surgeon: Leonie Man, MD;  Location: [facility];  Service: Cardiovascular;  Laterality: N/A;  . HERNIA REPAIR    . I&D EXTREMITY Left 08/21/2014   Procedure: INCISION AND DRAINAGE LEFT SMALL FINGER;  Surgeon: Leanora Cover, MD;  Location: [facility];  Service: Orthopedics;  Laterality: Left;  . I&D EXTREMITY Right 03/18/2017   Procedure: IRRIGATION AND DEBRIDEMENT EXTREMITY;  Surgeon: [name], MD;  Location: [facility];  Service: Orthopedics;  Laterality: Right;  . LEFT HEART CATH AND CORONARY ANGIOGRAPHY N/A 11/10/2018   Procedure: LEFT HEART CATH AND CORONARY ANGIOGRAPHY;  Surgeon: Leonie Man, MD;  Location: [facility];  Service: Cardiovascular;  Laterality: N/A;  . MINOR IRRIGATION AND DEBRIDEMENT OF WOUND Right 04/22/2014   Procedure: IRRIGATION AND DEBRIDEMENT OF RIGHT NECK ABCESS;  Surgeon: Jerrell Belfast, MD;  Location: [facility];  Service: ENT;  Laterality: Right;  . MULTIPLE EXTRACTIONS WITH ALVEOLOPLASTY N/A 01/18/2013   Procedure: MULTIPLE EXTRACION 3, 6, 7, 10, 11, 13, 21, 22, 27, 28, 29, 30 WITH ALVEOLOPLASTY;  Surgeon: Gae Bon, DDS;  Location: [facility];  Service: Oral Surgery;  Laterality: N/A;  . SKIN SPLIT GRAFT Right 03/21/2017   Procedure: IRRIGATION AND DEBRIDEMENT RIGHT FOOT AND APPLY SPLIT THICKNESS SKIN GRAFT AND WOUND VAC;  Surgeon: [name], MD;  Location: [facility];  Service: Orthopedics;  Laterality: Right;  . TEE WITHOUT CARDIOVERSION N/A 05/02/2017    Procedure: TRANSESOPHAGEAL ECHOCARDIOGRAM (TEE);  Surgeon: Acie Fredrickson Wonda Cheng, MD;  Location: [facility];  Service: Cardiovascular;  Laterality: N/A;  coincidental to orthopedic case        Home Medications    Prior to Admission medications   Medication Sig Start Date End Date Taking? Authorizing Provider  amLODipine (NORVASC) 10 MG tablet Take 1 tablet (10 mg total) by mouth daily. 07/09/18   [name], FNP  aspirin EC 81 MG EC tablet Take 1 tablet (81 mg total) by mouth daily. 11/13/18   Cheryln Manly, NP  atorvastatin (LIPITOR) 80 MG tablet Take 1 tablet (80 mg total) by mouth daily at 6 PM. 11/13/18   Cheryln Manly, NP  bictegravir-emtricitabine-tenofovir AF (BIKTARVY) 50-200-25 MG TABS tablet Take 1 tablet by mouth daily. 10/15/18   [name], MD  blood glucose meter kit and supplies Dispense based on patient and insurance preference. Use up to four times daily as directed. (FOR ICD-10 E10.9, E11.9). 01/13/19   [name], FNP  carvedilol (COREG) 25 MG tablet Take 1  tablet (25 mg total) by mouth 2 (two) times daily with a meal. 11/13/18   Cheryln Manly, NP  collagenase (SANTYL) ointment Apply topically 2 (two) times daily at 10 AM and 5 PM. 11/03/18   Hall, Lorenda Cahill, DO  EASY COMFORT PEN NEEDLES 31G X 5 MM MISC INJECT 20 UNITS DAILY AS DIRECTED. Patient taking differently: 20 Units See admin instructions. As directed 10/19/18   [name], FNP  escitalopram (LEXAPRO) 20 MG tablet Take 20 mg by mouth daily. 07/15/18   [provider]  glucose blood (COOL BLOOD GLUCOSE TEST STRIPS) test strip Use as instructed 01/05/18   [name], FNP  insulin lispro (HUMALOG) 100 UNIT/ML injection Inject 10 units under skin 3 times a day, with meals, for blood glucose levels greater than 125. 09/14/18 09/14/19  [name], FNP  Insulin Syringes, Disposable, U-100 0.3 ML MISC 1 Syringe by Does not apply route 3 (three) times daily. 09/22/18   [name], FNP   Lancets [facility] ULTRASOFT) lancets Use as instructed 01/05/18   [name], FNP  LANTUS SOLOSTAR 100 UNIT/ML Solostar Pen INJECT 20 UNITS INTO THE SKIN DAILY AT 10 PM.  LAST FOR 75 DAYS  01/06/19   [name], FNP  loperamide (IMODIUM A-D) 2 MG tablet Take 1 tablet (2 mg total) by mouth 4 (four) times daily as needed for diarrhea or loose stools. 01/13/19   [name], FNP  nitroGLYCERIN (NITROSTAT) 0.4 MG SL tablet Place 1 tablet (0.4 mg total) under the tongue every 5 (five) minutes x 3 doses as needed for chest pain. 11/13/18   Cheryln Manly, NP  oxybutynin (DITROPAN) 5 MG tablet TAKE 1 TABLET (5 MG TOTAL) BY MOUTH 2 (TWO) TIMES DAILY. 10/19/18   [name], FNP  oxyCODONE 10 MG TABS Take 1 tablet (10 mg total) by mouth daily as needed for up to 5 doses for severe pain or breakthrough pain (pain). 11/03/18   Kayleen Memos, DO  silver sulfADIAZINE (SILVADENE) 1 % cream Apply 1 application topically daily. Apply to wound daily 11/06/18   [name], MD  ticagrelor (BRILINTA) 90 MG TABS tablet Take 1 tablet (90 mg total) by mouth every 12 (twelve) hours. 11/13/18   Cheryln Manly, NP    Family History Family History  Problem Relation Age of Onset  . Hypertension Mother   . Arthritis Father   . Hypertension Father   . Hypertension Brother   . Cancer Maternal Grandmother 42       unknown type of cancer  . Depression Paternal Grandmother     Social History Social History   Tobacco Use  . Smoking status: Never Smoker  . Smokeless tobacco: Never Used  Substance Use Topics  . Alcohol use: Never    Frequency: Never  . Drug use: Never    Comment: OTC ASA     Allergies   Sulfur and Sulfa antibiotics   Review of Systems Review of Systems  Constitutional: Negative for chills and fever.  HENT: Negative.   Eyes: Negative for visual disturbance.  Respiratory: Negative for cough and shortness of breath.   Cardiovascular: Negative for chest pain and  leg swelling.  Gastrointestinal: Negative for abdominal pain, diarrhea, nausea and vomiting.  Genitourinary: Negative for dysuria.  Musculoskeletal: Negative for arthralgias and myalgias.  Skin: Negative for color change and rash.  Neurological: Negative for dizziness, syncope, weakness, light-headedness, numbness and headaches.     Physical  Exam Updated Vital Signs BP (!) 175/112 (BP Location: Right Arm)   Pulse 70   Temp 97.6 F (36.4 C)   Resp 18   SpO2 100%   Physical Exam Vitals signs and nursing note reviewed.  Constitutional:      General: He is not in acute distress.    Appearance: He is well-developed and overweight. He is not diaphoretic.  HENT:     Head: Normocephalic and atraumatic.  Eyes:     General:        Right eye: No discharge.        Left eye: No discharge.     Pupils: Pupils are equal, round, and reactive to light.  Neck:     Musculoskeletal: Neck supple.  Cardiovascular:     Rate and Rhythm: Normal rate and regular rhythm.     Pulses: Normal pulses.     Heart sounds: Normal heart sounds. No murmur. No friction rub. No gallop.   Pulmonary:     Effort: Pulmonary effort is normal. No respiratory distress.     Breath sounds: Normal breath sounds. No wheezing or rales.     Comments: Respirations equal and unlabored, patient able to speak in full sentences, lungs clear to auscultation bilaterally Abdominal:     General: Bowel sounds are normal. There is no distension.     Palpations: Abdomen is soft. There is no mass.     Tenderness: There is no abdominal tenderness. There is no guarding.     Comments: Abdomen soft, nondistended, nontender to palpation in all quadrants without guarding or peritoneal signs  Musculoskeletal:        General: No deformity.     Comments: Right BKA  Skin:    General: Skin is warm and dry.     Capillary Refill: Capillary refill takes less than 2 seconds.  Neurological:     Mental Status: He is alert.     Coordination:  Coordination normal.     Comments: Speech is clear, able to follow commands CN III-XII intact Normal strength in upper and lower extremities bilaterally including dorsiflexion and plantar flexion, strong and equal grip strength Sensation normal to light and sharp touch Moves extremities without ataxia, coordination intact  Psychiatric:        Mood and Affect: Mood normal.        Behavior: Behavior normal.      ED Treatments / Results  Labs (all labs ordered are listed, but only abnormal results are displayed) Labs Reviewed  CBC WITH DIFFERENTIAL/PLATELET - Abnormal; Notable for the following components:      Result Value   Hemoglobin 11.2 (*)    HCT 36.0 (*)    All other components within normal limits  COMPREHENSIVE METABOLIC PANEL - Abnormal; Notable for the following components:   Glucose, Bld 102 (*)    BUN 23 (*)    Creatinine, Ser 1.76 (*)    Total Protein 8.3 (*)    GFR calc non Af Amer 43 (*)    GFR calc Af Amer 50 (*)    All other components within normal limits  CBG MONITORING, ED - Abnormal; Notable for the following components:   Glucose-Capillary 104 (*)    All other components within normal limits  URINALYSIS, ROUTINE W REFLEX MICROSCOPIC    EKG None  Radiology No results found.  Procedures Procedures (including critical care time)  Medications Ordered in ED Medications  amLODipine (NORVASC) tablet 10 mg (has no administration in time range)  carvedilol (  COREG) tablet 25 mg (has no administration in time range)  sodium chloride 0.9 % bolus 1,000 mL (has no administration in time range)     Initial Impression / Assessment and Plan / ED Course  I have reviewed the triage vital signs and the nursing notes.  Pertinent labs & imaging results that were available during my care of the patient were reviewed by me and considered in my medical decision making (see chart for details).  53 year old male presents with concern for hyperglycemia, on arrival  CBG is 104 the patient is noted to be hypertensive.  Reports he ran out of his test strips this week and his blood pressure medication 1 month ago.  He denies any associated chest pain, shortness of breath, headaches, visual changes, numbness or weakness.  Has not had any vomiting or abdominal pain.  No fevers or chills.  Lab work obtained from triage shows a glucose of 102, creatinine is slightly elevated from baseline at 1.76, but no other significant electrolyte derangements, normal liver function, no anion gap.  Stable hemoglobin, no leukocytosis.  Urinalysis without ketones or signs of infection.  Fortunately patient's glucose is normal and no signs of DKA, but he does have a slight AKI, likely in the setting of hypertension and poor p.o. intake patient reports he has not been drinking water.  Give patient's home blood pressure medication and Coreg as he has not taken this today, and will give 1 L IV fluids.  Will refill blood pressure medication and stressed today but stressed the importance to patient of taking medication regularly and following up with PCP.  Will need to have kidney function rechecked with PCP in about a week.  Final Clinical Impressions(s) / ED Diagnoses   Final diagnoses:  Hypertension, unspecified type  AKI (acute kidney injury) ([place])  Medication refill    ED Discharge Orders         Ordered    amLODipine (NORVASC) 10 MG tablet  Daily     02/07/19 0708    glucose blood (COOL BLOOD GLUCOSE TEST STRIPS) test strip     02/07/19 0708           Jacqlyn Larsen, PA-C 02/07/19 1610    Ripley Fraise, MD 02/11/19 2309

## 2019-02-10 DIAGNOSIS — M545 Low back pain: Secondary | ICD-10-CM | POA: Diagnosis not present

## 2019-02-10 DIAGNOSIS — Z79899 Other long term (current) drug therapy: Secondary | ICD-10-CM | POA: Diagnosis not present

## 2019-02-16 ENCOUNTER — Ambulatory Visit: Payer: Medicare Other | Admitting: Family

## 2019-02-17 DIAGNOSIS — Z658 Other specified problems related to psychosocial circumstances: Secondary | ICD-10-CM | POA: Diagnosis not present

## 2019-02-17 DIAGNOSIS — F329 Major depressive disorder, single episode, unspecified: Secondary | ICD-10-CM | POA: Diagnosis not present

## 2019-02-17 DIAGNOSIS — F411 Generalized anxiety disorder: Secondary | ICD-10-CM | POA: Diagnosis not present

## 2019-03-02 DIAGNOSIS — E785 Hyperlipidemia, unspecified: Secondary | ICD-10-CM | POA: Diagnosis not present

## 2019-03-02 DIAGNOSIS — N1831 Chronic kidney disease, stage 3a: Secondary | ICD-10-CM | POA: Diagnosis not present

## 2019-03-02 DIAGNOSIS — E1122 Type 2 diabetes mellitus with diabetic chronic kidney disease: Secondary | ICD-10-CM | POA: Diagnosis not present

## 2019-03-02 DIAGNOSIS — I129 Hypertensive chronic kidney disease with stage 1 through stage 4 chronic kidney disease, or unspecified chronic kidney disease: Secondary | ICD-10-CM | POA: Diagnosis not present

## 2019-03-21 ENCOUNTER — Other Ambulatory Visit: Payer: Self-pay | Admitting: Cardiology

## 2019-03-29 ENCOUNTER — Emergency Department (HOSPITAL_COMMUNITY): Payer: Medicare Other

## 2019-03-29 ENCOUNTER — Other Ambulatory Visit: Payer: Self-pay

## 2019-03-29 ENCOUNTER — Encounter (HOSPITAL_COMMUNITY): Payer: Self-pay | Admitting: Emergency Medicine

## 2019-03-29 ENCOUNTER — Emergency Department (HOSPITAL_COMMUNITY)
Admission: EM | Admit: 2019-03-29 | Discharge: 2019-03-29 | Disposition: A | Discharge status: Home / Self Care | Payer: Medicare Other | Attending: Emergency Medicine | Admitting: Emergency Medicine

## 2019-03-29 DIAGNOSIS — E1122 Type 2 diabetes mellitus with diabetic chronic kidney disease: Secondary | ICD-10-CM | POA: Diagnosis not present

## 2019-03-29 DIAGNOSIS — Z794 Long term (current) use of insulin: Secondary | ICD-10-CM | POA: Insufficient documentation

## 2019-03-29 DIAGNOSIS — N182 Chronic kidney disease, stage 2 (mild): Secondary | ICD-10-CM | POA: Diagnosis not present

## 2019-03-29 DIAGNOSIS — M79606 Pain in leg, unspecified: Secondary | ICD-10-CM | POA: Diagnosis present

## 2019-03-29 DIAGNOSIS — Z7982 Long term (current) use of aspirin: Secondary | ICD-10-CM | POA: Diagnosis not present

## 2019-03-29 DIAGNOSIS — I129 Hypertensive chronic kidney disease with stage 1 through stage 4 chronic kidney disease, or unspecified chronic kidney disease: Secondary | ICD-10-CM | POA: Diagnosis not present

## 2019-03-29 DIAGNOSIS — B20 Human immunodeficiency virus [HIV] disease: Secondary | ICD-10-CM | POA: Diagnosis not present

## 2019-03-29 DIAGNOSIS — Z79899 Other long term (current) drug therapy: Secondary | ICD-10-CM | POA: Diagnosis not present

## 2019-03-29 DIAGNOSIS — M25561 Pain in right knee: Secondary | ICD-10-CM | POA: Insufficient documentation

## 2019-03-29 MED ORDER — ACETAMINOPHEN 500 MG PO TABS: 1000.0000 mg | ORAL_TABLET | Freq: Once | ORAL | Status: DC

## 2019-03-29 MED ORDER — OXYCODONE HCL 5 MG PO TABS
10.0000 mg | ORAL_TABLET | Freq: Once | ORAL | Status: AC
  Administered 2019-03-29: 10 mg via ORAL
  Filled 2019-03-29: qty 2

## 2019-03-29 NOTE — Discharge Instructions (Addendum)
X-ray of the knee was normal today. Follow-up with your doctor on Wednesday to have your pain medications refilled. In the meantime, can take up to 2000mg  tylenol daily to help with pain. Return here for any new/acute changes.

## 2019-03-29 NOTE — ED Triage Notes (Signed)
Patient here with right leg pain, he states that he fell yesterday, states that the pain is around his right knee, he is a BKA on the right.

## 2019-03-29 NOTE — ED Provider Notes (Signed)
[facility] EMERGENCY DEPARTMENT Provider Note   CSN: [id number] Arrival date & time: 03/29/19  0502     History Chief Complaint  Patient presents with  . Leg Pain    Keith [name] is a 53 y.o. male.  The history is provided by the patient and medical records.  Leg Pain   53 year old male with history of chronic kidney disease, diabetes, HIV, history of osteomyelitis of right foot status post BKA, presenting to the ED with right knee pain.  States he was playing with his son yesterday, lost his footing, and fell onto right knee on carpeted surface.  There was no head injury or loss of consciousness.  States he has had some ongoing pain in his right knee since this time.  He has not had any noted fever, chills, or sick contacts.  He is followed by orthopedics, [name].  He did recently get fitted for new prosthesis which has been working better for him.  Past Medical History:  Diagnosis Date  . Abscess of right foot    abscess/ulcer of R transtibial amputation requiring IV abx  . AIDS ([place])   . Chronic anemia   . Chronic knee pain    right  . Chronic pain   . CKD (chronic kidney disease), stage II   . Diabetes type 2, uncontrolled ([place])    HgA1c 17.6 (04/27/2010)  . Diabetic foot ulcer ([place]) 01/2017   right foot  . Erectile dysfunction   . Genital warts   . HIV (human immunodeficiency virus infection) ([place]) 2009   CD4 count 100, VL 13800 (05/01/2010)  . Hypertension   . Osteomyelitis ([place])    h/o hand  . Osteomyelitis of metatarsal ([place]) 04/28/2017    Patient Active Problem List   Diagnosis Date Noted  . Uncontrolled type 2 diabetes mellitus with hyperglycemia ([place]) 01/14/2019  . Elevated glucose 01/14/2019  . Hemoglobin A1C between 7% and 9% indicating borderline diabetic control 01/14/2019  . Hx of BKA, right ([place]) 01/14/2019  . Pressure injury of skin 11/11/2018  . Acute myocardial infarction ([place]) 11/10/2018  . CKD (chronic kidney disease),  stage II 11/10/2018  . Amputation stump infection ([place]) 10/28/2018  . Type II diabetes mellitus with renal manifestations ([place]) 08/07/2018  . Uncontrolled type 2 diabetes mellitus with hyperosmolar nonketotic hyperglycemia ([place]) 08/07/2018  . Non-pressure chronic ulcer of right calf limited to breakdown of skin ([place]) 07/06/2018  . Type 2 diabetes mellitus without complication, without long-term current use of insulin ([place]) 06/15/2018  . Chronic low back pain without sciatica 06/15/2018  . Idiopathic chronic venous hypertension of left lower extremity with ulcer and inflammation ([place])   . Venous stasis ulcer of left calf limited to breakdown of skin without varicose veins ([place]) 04/23/2018  . Acquired absence of right leg below knee ([place]) 06/13/2017  . Acute renal failure superimposed on stage 3 chronic kidney disease ([place]) 03/15/2017  . Diabetic polyneuropathy associated with type 2 diabetes mellitus ([place]) 01/23/2017  . AKI (acute kidney injury) ([place]) 09/28/2016  . Nausea vomiting and diarrhea 09/28/2016  . Onychomycosis of multiple toenails with type 2 diabetes mellitus ([place]) 08/29/2015  . MRSA carrier 04/20/2014  . Penile wart 02/22/2014  . HIV disease ([place])   . Insulin-requiring or dependent type II diabetes mellitus ([place]) 02/04/2014  . Dental anomaly 11/20/2012  . Arthritis of right knee 02/23/2012  . Hyperlipidemia 11/10/2011  . Chronic pain 08/07/2011  . Meralgia paraesthetica 04/23/2011  . ERECTILE DYSFUNCTION 08/22/2008  .  Essential hypertension 05/19/2008    Past Surgical History:  Procedure Laterality Date  . AMPUTATION Right 05/02/2017   Procedure: AMPUTATION TRANSMETARSAL;  Surgeon: [name], MD;  Location: [facility];  Service: Orthopedics;  Laterality: Right;  . AMPUTATION Right 06/13/2017   Procedure: RIGHT BELOW KNEE AMPUTATION;  Surgeon: [name], MD;  Location: [facility];  Service: Orthopedics;  Laterality: Right;  . BELOW KNEE LEG AMPUTATION Right 06/13/2017  .  BELOW KNEE LEG AMPUTATION    . CORONARY STENT INTERVENTION N/A 11/10/2018   Procedure: CORONARY STENT INTERVENTION;  Surgeon: Leonie Man, MD;  Location: [facility] CV LAB;  Service: Cardiovascular;  Laterality: N/A;  . HERNIA REPAIR    . I & D EXTREMITY Left 08/21/2014   Procedure: INCISION AND DRAINAGE LEFT SMALL FINGER;  Surgeon: Leanora Cover, MD;  Location: [facility];  Service: Orthopedics;  Laterality: Left;  . I & D EXTREMITY Right 03/18/2017   Procedure: IRRIGATION AND DEBRIDEMENT EXTREMITY;  Surgeon: [name], MD;  Location: [facility];  Service: Orthopedics;  Laterality: Right;  . LEFT HEART CATH AND CORONARY ANGIOGRAPHY N/A 11/10/2018   Procedure: LEFT HEART CATH AND CORONARY ANGIOGRAPHY;  Surgeon: Leonie Man, MD;  Location: [facility] CV LAB;  Service: Cardiovascular;  Laterality: N/A;  . MINOR IRRIGATION AND DEBRIDEMENT OF WOUND Right 04/22/2014   Procedure: IRRIGATION AND DEBRIDEMENT OF RIGHT NECK ABCESS;  Surgeon: Jerrell Belfast, MD;  Location: [facility];  Service: ENT;  Laterality: Right;  . MULTIPLE EXTRACTIONS WITH ALVEOLOPLASTY N/A 01/18/2013   Procedure: MULTIPLE EXTRACION 3, 6, 7, 10, 11, 13, 21, 22, 27, 28, 29, 30 WITH ALVEOLOPLASTY;  Surgeon: Gae Bon, DDS;  Location: [facility];  Service: Oral Surgery;  Laterality: N/A;  . SKIN SPLIT GRAFT Right 03/21/2017   Procedure: IRRIGATION AND DEBRIDEMENT RIGHT FOOT AND APPLY SPLIT THICKNESS SKIN GRAFT AND WOUND VAC;  Surgeon: [name], MD;  Location: [facility];  Service: Orthopedics;  Laterality: Right;  . TEE WITHOUT CARDIOVERSION N/A 05/02/2017   Procedure: TRANSESOPHAGEAL ECHOCARDIOGRAM (TEE);  Surgeon: Acie Fredrickson Wonda Cheng, MD;  Location: [facility] OR;  Service: Cardiovascular;  Laterality: N/A;  coincidental to orthopedic case       Family History  Problem Relation Age of Onset  . Hypertension Mother   . Arthritis Father   . Hypertension Father   . Hypertension Brother   . Cancer Maternal Grandmother 70       unknown type of  cancer  . Depression Paternal Grandmother     Social History   Tobacco Use  . Smoking status: Never Smoker  . Smokeless tobacco: Never Used  Substance Use Topics  . Alcohol use: Never  . Drug use: Never    Comment: OTC ASA    Home Medications Prior to Admission medications   Medication Sig Start Date End Date Taking? Authorizing Provider  amLODipine (NORVASC) 10 MG tablet Take 1 tablet (10 mg total) by mouth daily. 02/07/19   Jacqlyn Larsen, PA-C  aspirin EC 81 MG EC tablet Take 1 tablet (81 mg total) by mouth daily. 11/13/18   Cheryln Manly, NP  atorvastatin (LIPITOR) 80 MG tablet Take 1 tablet (80 mg total) by mouth daily at 6 PM. 11/13/18   Cheryln Manly, NP  bictegravir-emtricitabine-tenofovir AF (BIKTARVY) 50-200-25 MG TABS tablet Take 1 tablet by mouth daily. 10/15/18   [name], MD  blood glucose meter kit and supplies Dispense based on patient and insurance preference. Use up to  four times daily as directed. (FOR ICD-10 E10.9, E11.9). 01/13/19   [name], FNP  carvedilol (COREG) 25 MG tablet Take 1 tablet (25 mg total) by mouth 2 (two) times daily with a meal. Please make appt for future refills. [phone]. 1st attempt. 03/22/19   Cheryln Manly, NP  collagenase (SANTYL) ointment Apply topically 2 (two) times daily at 10 AM and 5 PM. 11/03/18   Kayleen Memos, DO  EASY COMFORT PEN NEEDLES 31G X 5 MM MISC INJECT 20 UNITS DAILY AS DIRECTED. Patient taking differently: 20 Units See admin instructions. As directed 10/19/18   [name], FNP  escitalopram (LEXAPRO) 20 MG tablet Take 20 mg by mouth daily. 07/15/18   [provider]  glucose blood (COOL BLOOD GLUCOSE TEST STRIPS) test strip Use as instructed 02/07/19   Jacqlyn Larsen, PA-C  insulin lispro (HUMALOG) 100 UNIT/ML injection Inject 10 units under skin 3 times a day, with meals, for blood glucose levels greater than 125. 09/14/18 09/14/19  [name], FNP  Insulin Syringes,  Disposable, U-100 0.3 ML MISC 1 Syringe by Does not apply route 3 (three) times daily. 09/22/18   [name], FNP  Lancets [facility] ULTRASOFT) lancets Use as instructed 01/05/18   [name], FNP  LANTUS SOLOSTAR 100 UNIT/ML Solostar Pen INJECT 20 UNITS INTO THE SKIN DAILY AT 10 PM.  LAST FOR 75 DAYS  01/06/19   [name], FNP  loperamide (IMODIUM A-D) 2 MG tablet Take 1 tablet (2 mg total) by mouth 4 (four) times daily as needed for diarrhea or loose stools. 01/13/19   [name], FNP  nitroGLYCERIN (NITROSTAT) 0.4 MG SL tablet Place 1 tablet (0.4 mg total) under the tongue every 5 (five) minutes x 3 doses as needed for chest pain. 11/13/18   Cheryln Manly, NP  oxybutynin (DITROPAN) 5 MG tablet TAKE 1 TABLET (5 MG TOTAL) BY MOUTH 2 (TWO) TIMES DAILY. 10/19/18   [name], FNP  oxyCODONE 10 MG TABS Take 1 tablet (10 mg total) by mouth daily as needed for up to 5 doses for severe pain or breakthrough pain (pain). 11/03/18   Kayleen Memos, DO  silver sulfADIAZINE (SILVADENE) 1 % cream Apply 1 application topically daily. Apply to wound daily 11/06/18   [name], MD  ticagrelor (BRILINTA) 90 MG TABS tablet Take 1 tablet (90 mg total) by mouth every 12 (twelve) hours. 11/13/18   Cheryln Manly, NP    Allergies    Sulfur and Sulfa antibiotics  Review of Systems   Review of Systems  Musculoskeletal: Positive for arthralgias.  All other systems reviewed and are negative.   Physical Exam Updated Vital Signs BP (!) 148/88 (BP Location: Right Arm) Comment: Simultaneous filing. User may not have seen previous data. Comment (BP Location): Simultaneous filing. User may not have seen previous data.  Pulse 80 Comment: Simultaneous filing. User may not have seen previous data.  Temp 98.8 F (37.1 C) (Oral) Comment: Simultaneous filing. User may not have seen previous data. Comment (Src): Simultaneous filing. User may not have seen previous data.  Resp 17 Comment:  Simultaneous filing. User may not have seen previous data.  SpO2 100% Comment: Simultaneous filing. User may not have seen previous data.  Physical Exam Vitals and nursing note reviewed.  Constitutional:      Appearance: He is well-developed.  HENT:     Head: Normocephalic and atraumatic.  Eyes:  Conjunctiva/sclera: Conjunctivae normal.     Pupils: Pupils are equal, round, and reactive to light.  Cardiovascular:     Rate and Rhythm: Normal rate and regular rhythm.     Heart sounds: Normal heart sounds.  Pulmonary:     Effort: Pulmonary effort is normal.     Breath sounds: Normal breath sounds.  Abdominal:     General: Bowel sounds are normal.     Palpations: Abdomen is soft.  Musculoskeletal:        General: Normal range of motion.     Cervical back: Normal range of motion.     Comments: Right BKA, abrasion over the right patella without any sign of abscess formation, drainage, or skin discoloration, no warmth to touch, some mild tenderness along the medial joint line  Skin:    General: Skin is warm and dry.  Neurological:     Mental Status: He is alert and oriented to person, place, and time.     ED Results / Procedures / Treatments   Labs (all labs ordered are listed, but only abnormal results are displayed) Labs Reviewed - No data to display  EKG None  Radiology DG Knee Complete 4 Views Right  Result Date: 03/29/2019 CLINICAL DATA:  Fall.  Right BKA. EXAM: RIGHT KNEE - COMPLETE 4+ VIEW COMPARISON:  03/04/2016. FINDINGS: BKA. Mild tricompartment degenerative change. No acute bony abnormality identified. No evidence of fracture or dislocation. No evidence of effusion. IMPRESSION: BKA.  Mild degenerative change.  No acute abnormality identified. Electronically Signed   By: Marcello Moores  Register   On: 03/29/2019 05:48    Procedures Procedures (including critical care time)  Medications Ordered in ED Medications  acetaminophen (TYLENOL) tablet 1,000 mg (has no  administration in time range)  oxyCODONE (Oxy IR/ROXICODONE) immediate release tablet 10 mg (10 mg Oral Given 03/29/19 0522)    ED Course  I have reviewed the triage vital signs and the nursing notes.  Pertinent labs & imaging results that were available during my care of the patient were reviewed by me and considered in my medical decision making (see chart for details).    MDM Rules/Calculators/A&P  53 year old male presenting to the ED with right knee pain.  Has right BKA and while playing with his son today he lost his footing and fell onto right knee on carpeted surface.  No head injury or loss of consciousness.  Has abrasion over the right knee and some tenderness along the medial aspect but no significant bony deformity, swelling, or skin discoloration.  X-ray pending.  Requested his home dose of oxycodone which was given.  6:04 AM X-ray negative.  Patient is requesting further narcotics here or some to take home with him.  He was given home dose of oxycodone 32m less than an hour ago.  States he is out of his home pain medication and is not due to see his physician again until Wednesday (2 days from now).  He is on chronic, scheduled narcotics.  This will need to be refilled by his primary care doctor on Wednesday which I discussed with him.  Can take tylenol in the interim.  Will avoid NSAIDs due to CKD.  He may return here for any new/acute changes.  Final Clinical Impression(s) / ED Diagnoses Final diagnoses:  Acute pain of right knee    Rx / DC Orders ED Discharge Orders    None       SLarene Pickett PA-C 03/29/19 01950   Mesner, JCorene Cornea MD  03/29/19 5749

## 2019-04-16 ENCOUNTER — Other Ambulatory Visit: Payer: Self-pay

## 2019-04-16 ENCOUNTER — Ambulatory Visit: Payer: Medicare Other | Admitting: Family Medicine

## 2019-05-03 IMAGING — MR MR [PERSON_NAME] LOW W/O CM*L*
8 of 9 series · 35 of 40 positions shown · non-contrast
Comparison: Radiographs 06/06/2018

CLINICAL DATA: Open wound along the lateral aspect of the left leg.

EXAM:
MRI OF LOWER LEFT EXTREMITY WITHOUT CONTRAST
TECHNIQUE: Multiplanar, multisequence MR imaging of the left lower extremity
was performed. No intravenous contrast was administered.

[Series 5: T1 · coronal · left · 4.0mm · 1.25mm/px · 4 of 36 slices shown (1 of 2)]
[im 1/36]
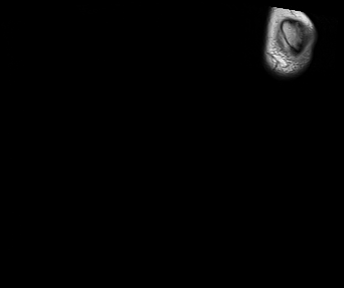
[im 12/36]
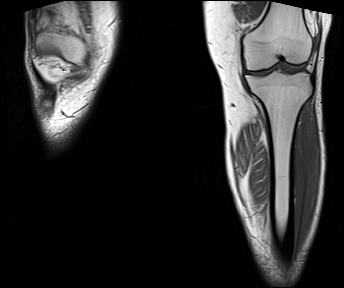
[im 24/36]
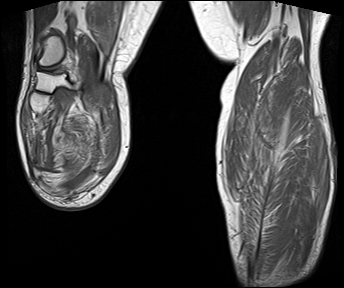
[im 36/36]
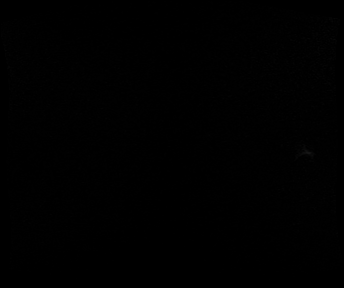

[Series 6: T1 · coronal · left · 4.0mm · 1.25mm/px · 3 of 36 slices shown (2 of 2)]
[im 1/36]
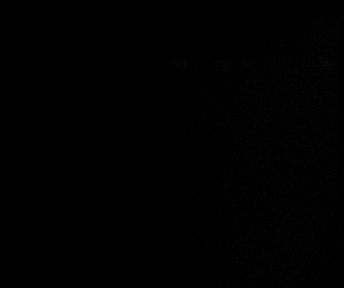
[im 18/36]
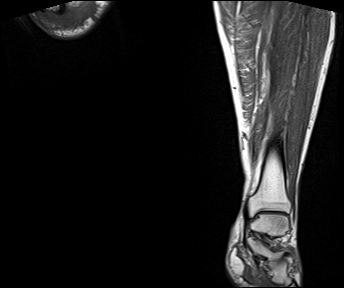
[im 36/36]
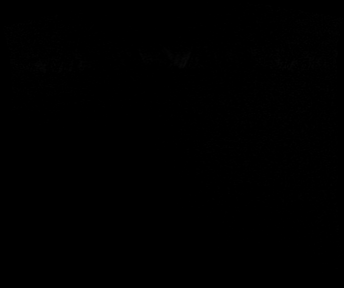

[Series 8: composed cor t1_comp_filt · coronal · left · 4.8mm · 1.25mm/px · 3 of 37 slices shown]
[im 1/37]
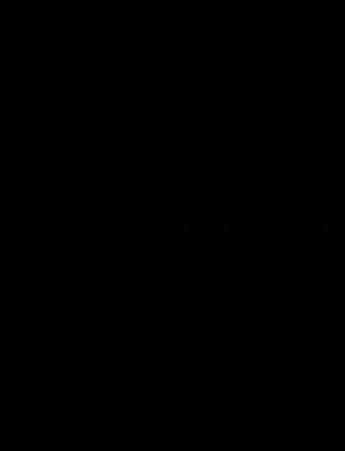
[im 19/37]
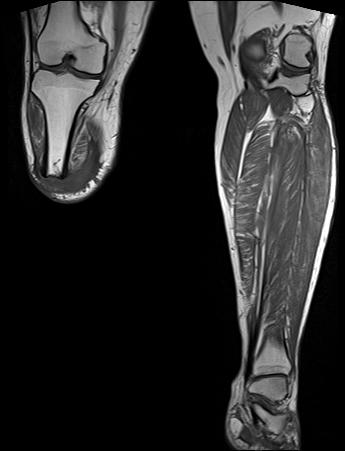
[im 37/37]
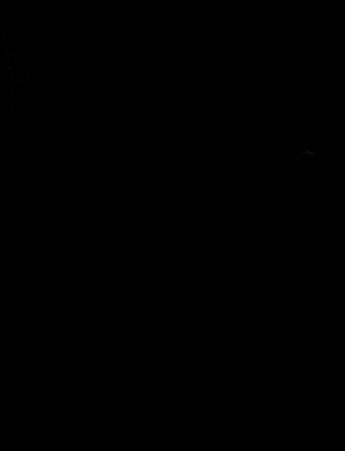

[Series 9: STIR · coronal · left · 4.0mm · 1.25mm/px · 3 of 40 slices shown (1 of 2)]
[im 1/40]
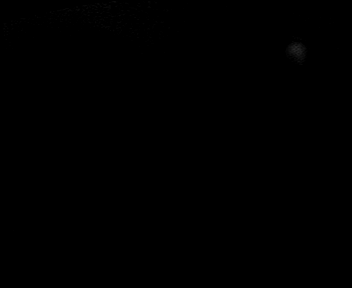
[im 20/40]
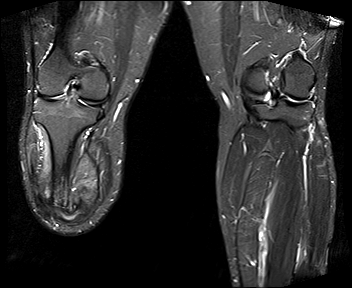
[im 40/40]
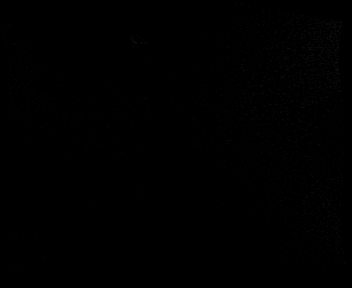

[Series 10: STIR · coronal · left · 4.0mm · 1.25mm/px · 3 of 40 slices shown (2 of 2)]
[im 1/40]
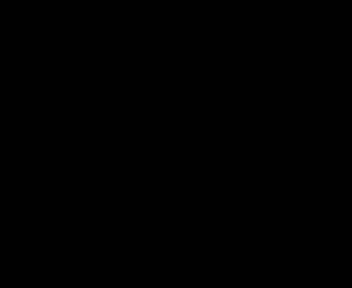
[im 20/40]
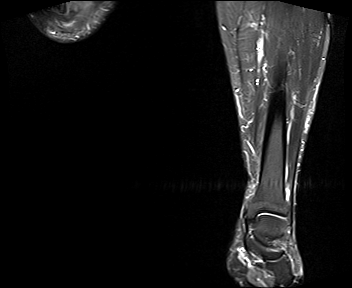
[im 40/40]
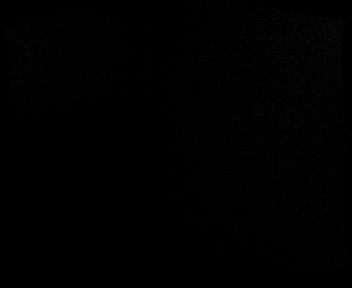

[Series 12: composed cor stir_comp_filt · coronal · left · 4.8mm · 1.25mm/px · 3 of 41 slices shown]
[im 1/41]
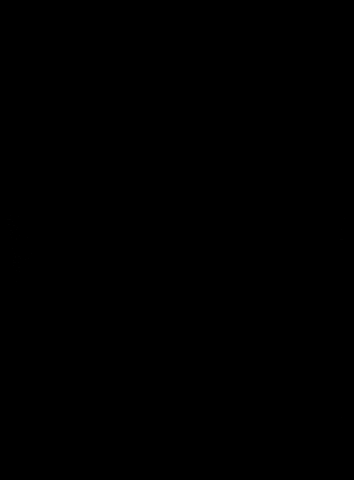
[im 21/41]
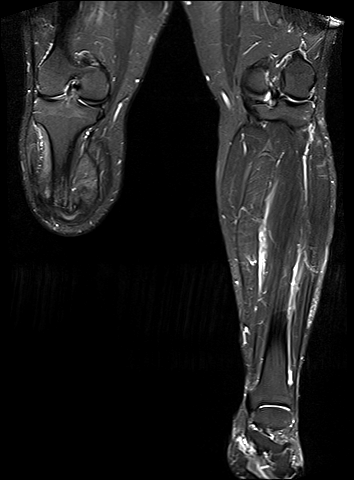
[im 41/41]
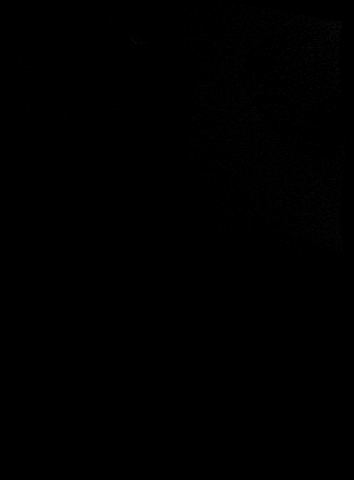

[Series 16: ax t1_comp_filt · axial · left · 4.0mm · 0.90mm/px · z∈[+25,+513]mm · 8 of 104 slices shown]
[im 1/104]
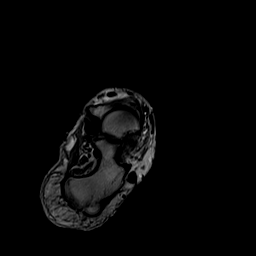
[im 13/104]
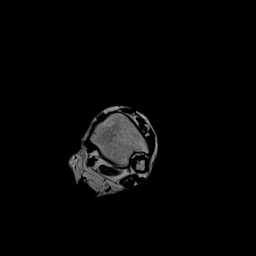
[im 26/104]
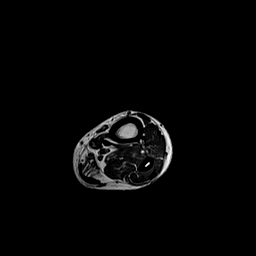
[im 39/104]
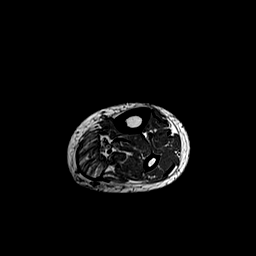
[im 65/104]
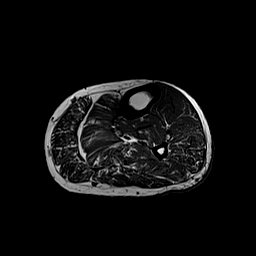
[im 78/104]
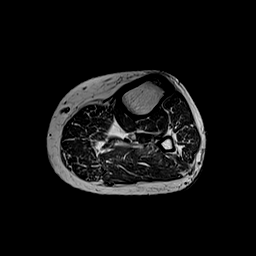
[im 91/104]
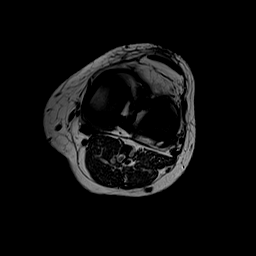
[im 104/104]
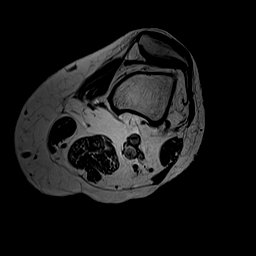

[Series 19: T2 · axial · left · 4.0mm · 0.45mm/px · z∈[+25,+513]mm · 8 of 104 slices shown]
[im 1/104]
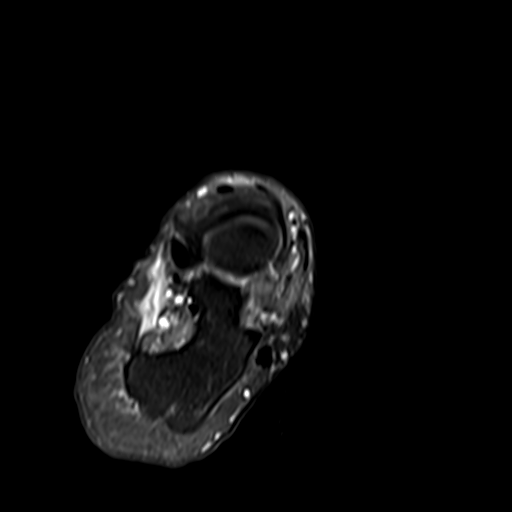
[im 13/104]
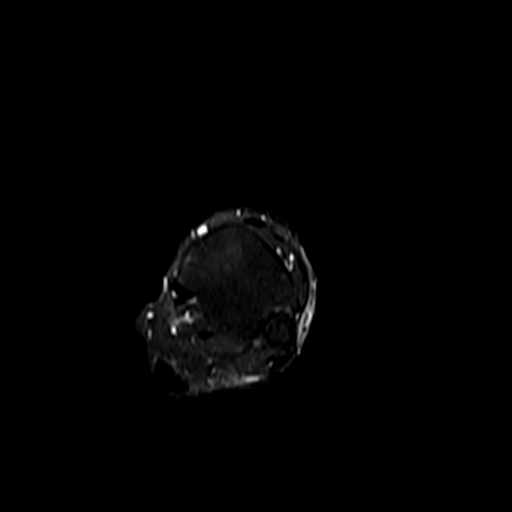
[im 26/104]
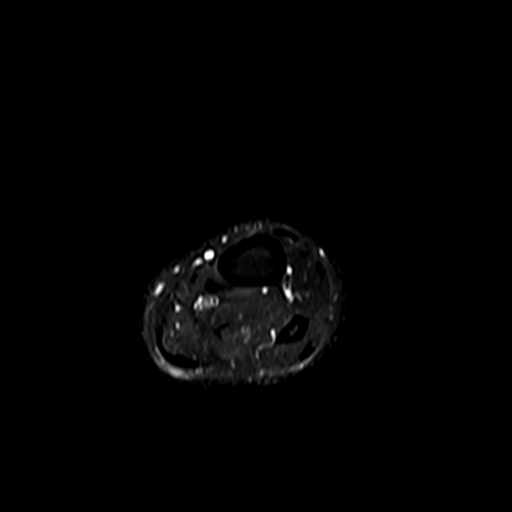
[im 39/104]
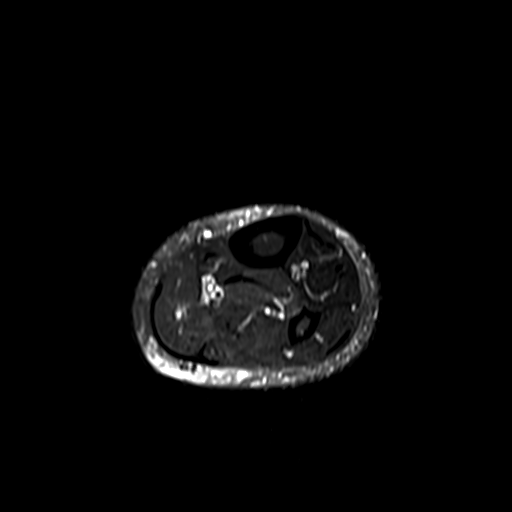
[im 65/104]
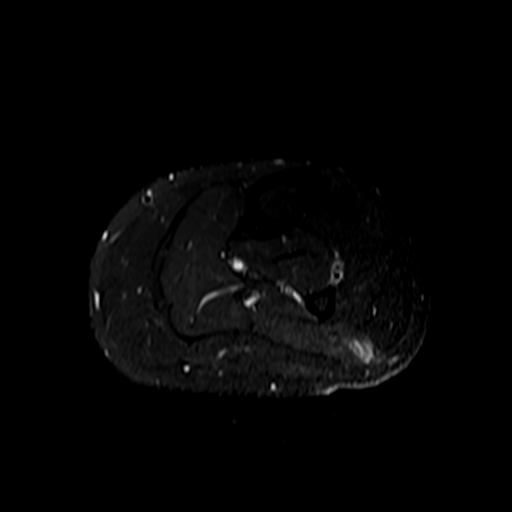
[im 78/104]
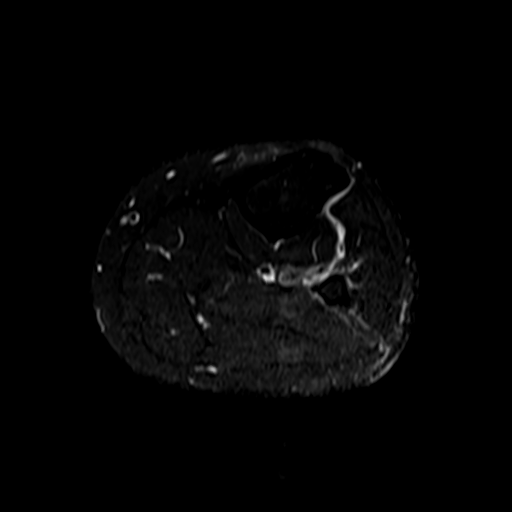
[im 91/104]
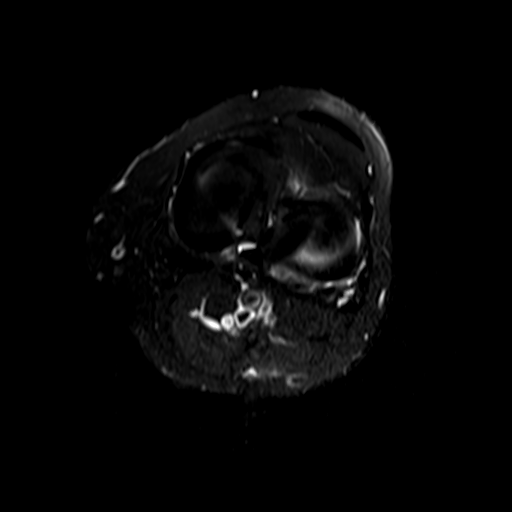
[im 104/104]
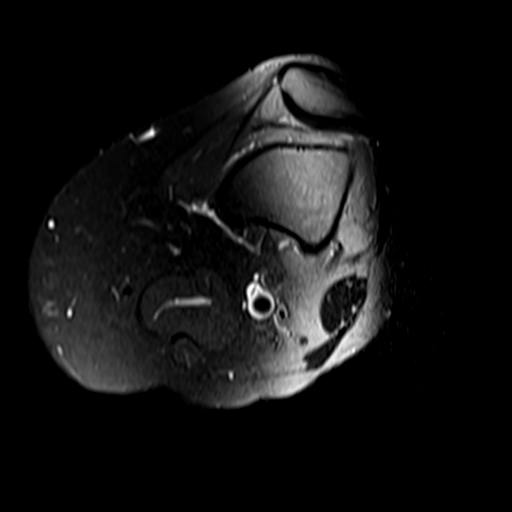

[35 of 40 positions shown; findings below may reference images not displayed]

FINDINGS: There is an open wound noted along the lateral aspect of the left
lower extremity in the proximal calf region. There is underlying
mild subcutaneous edema suggesting focal cellulitis. I do not see
any findings suspicious for myofasciitis or pyomyositis. No discrete
fluid collection to suggest a drainable abscess.

No findings for septic arthritis or osteomyelitis.
IMPRESSION: Upper left lateral calf ulceration/wound but no findings for
drainable soft tissue abscess, myofasciitis, pyomyositis, septic
arthritis or osteomyelitis.

## 2019-05-03 IMAGING — DX DG TIBIA/FIBULA 2V*L*
4 series · 4 of 4 positions shown · non-contrast
Comparison: Left knee radiograph dated 10/24/2009

CLINICAL DATA: 52-year-old male with open losing wound on the
lateral aspect of the left lower extremity.

EXAM:
LEFT TIBIA AND FIBULA - 2 VIEW

[tibia ap (1 of 2)]
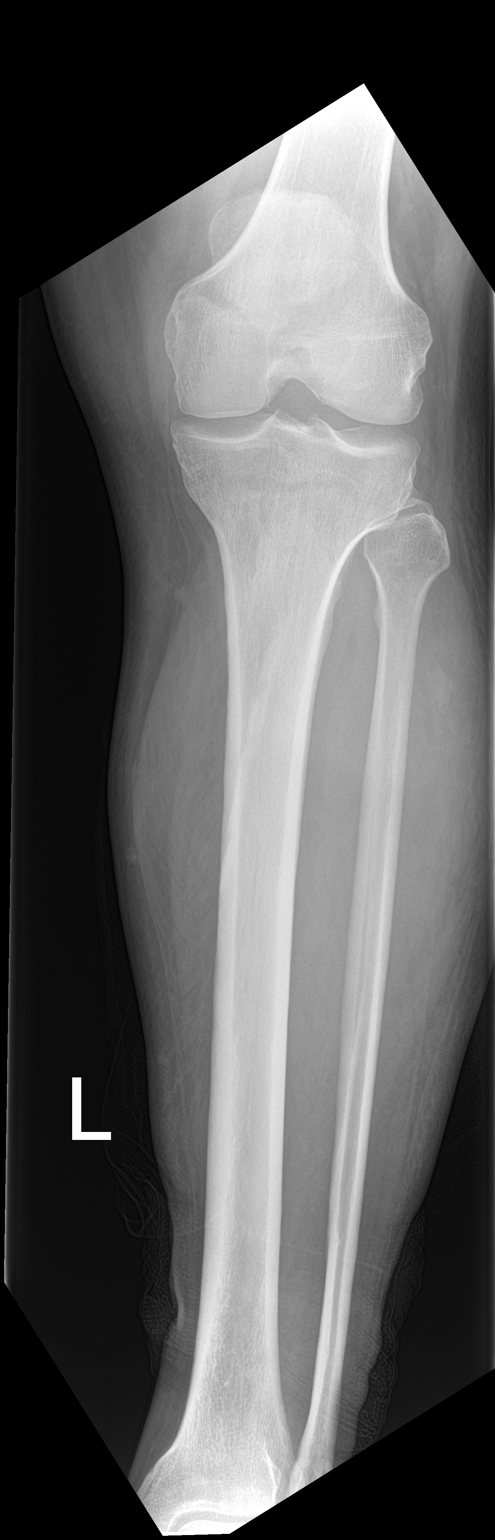

[tibia ap (2 of 2)]
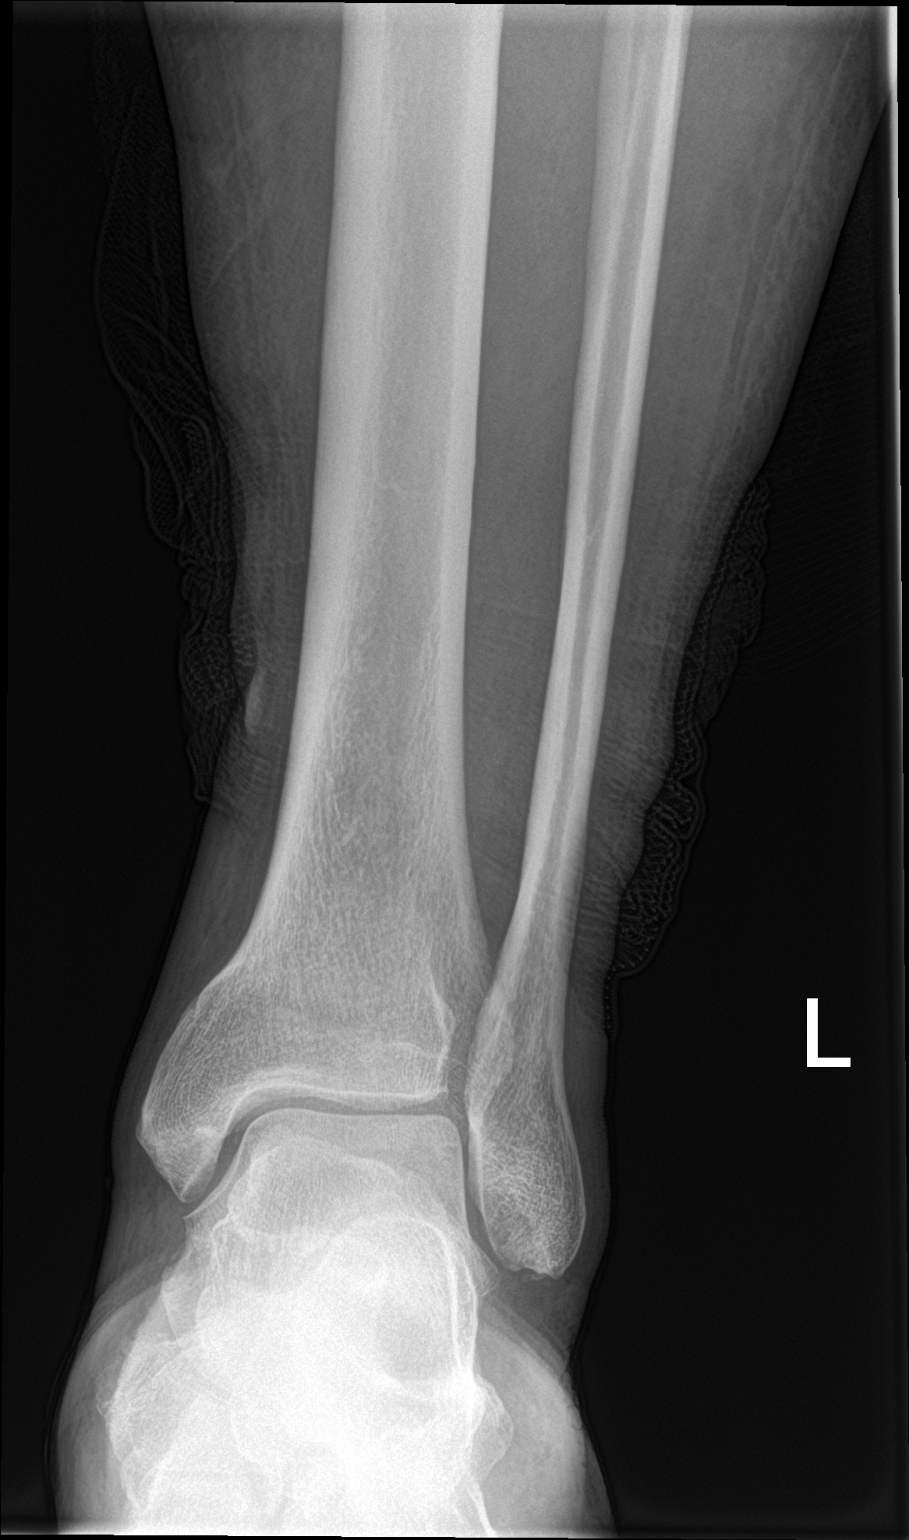

[tibia lat (1 of 2)]
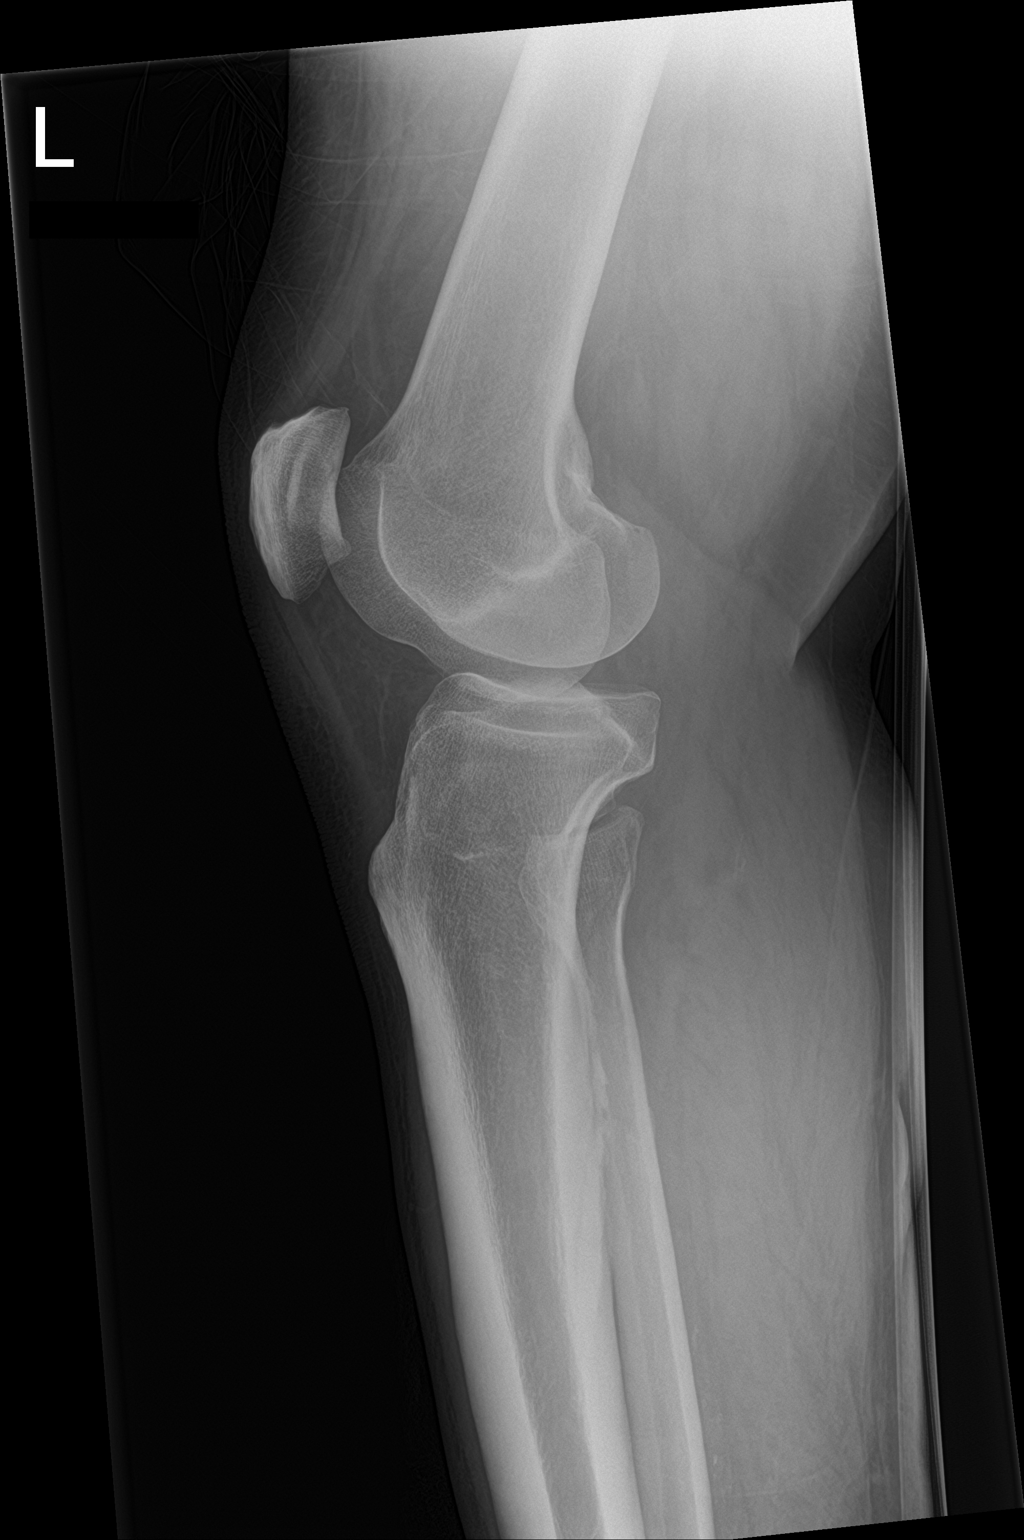

[tibia lat (2 of 2)]
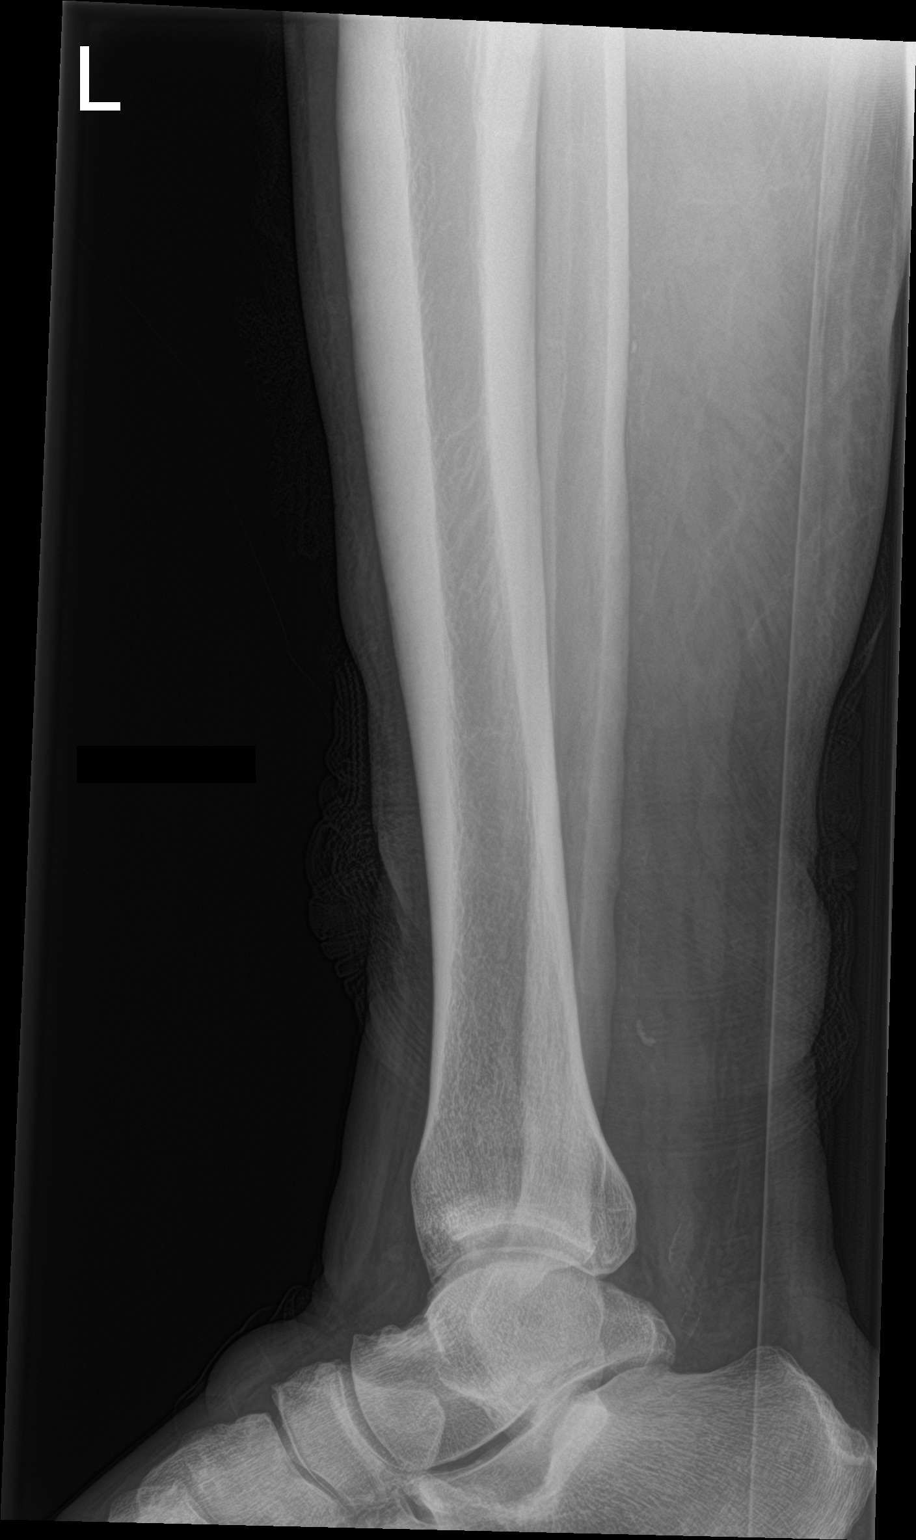

[4 of 4 positions shown; findings below may reference images not displayed]

FINDINGS: There is no acute fracture or dislocation. The bones are well
mineralized. No significant arthritic changes. No joint effusion.
Mild diffuse subcutaneous edema. No soft tissue gas.
IMPRESSION: 1. No acute/traumatic osseous pathology.
2. No soft tissue gas.

## 2019-05-11 ENCOUNTER — Other Ambulatory Visit: Payer: Self-pay

## 2019-05-11 ENCOUNTER — Ambulatory Visit (INDEPENDENT_AMBULATORY_CARE_PROVIDER_SITE_OTHER): Payer: Medicare HMO | Admitting: Family Medicine

## 2019-05-11 ENCOUNTER — Encounter: Payer: Self-pay | Admitting: Family Medicine

## 2019-05-11 DIAGNOSIS — R7303 Prediabetes: Secondary | ICD-10-CM

## 2019-05-11 DIAGNOSIS — Z09 Encounter for follow-up examination after completed treatment for conditions other than malignant neoplasm: Secondary | ICD-10-CM | POA: Diagnosis not present

## 2019-05-11 DIAGNOSIS — R197 Diarrhea, unspecified: Secondary | ICD-10-CM

## 2019-05-11 DIAGNOSIS — Z89511 Acquired absence of right leg below knee: Secondary | ICD-10-CM

## 2019-05-11 DIAGNOSIS — E1165 Type 2 diabetes mellitus with hyperglycemia: Secondary | ICD-10-CM | POA: Diagnosis not present

## 2019-05-11 DIAGNOSIS — F419 Anxiety disorder, unspecified: Secondary | ICD-10-CM

## 2019-05-11 DIAGNOSIS — R69 Illness, unspecified: Secondary | ICD-10-CM | POA: Diagnosis not present

## 2019-05-11 DIAGNOSIS — R739 Hyperglycemia, unspecified: Secondary | ICD-10-CM | POA: Diagnosis not present

## 2019-05-11 DIAGNOSIS — R112 Nausea with vomiting, unspecified: Secondary | ICD-10-CM | POA: Diagnosis not present

## 2019-05-11 DIAGNOSIS — E119 Type 2 diabetes mellitus without complications: Secondary | ICD-10-CM

## 2019-05-11 NOTE — Progress Notes (Signed)
Virtual Visit via Telephone Note  I connected with Keith [name] on 05/11/19 at 11:20 AM EST by telephone and verified that I am speaking with the correct person using two identifiers.   I discussed the limitations, risks, security and privacy concerns of performing an evaluation and management service by telephone and the availability of in person appointments. I also discussed with the patient that there may be a patient responsible charge related to this service. The patient expressed understanding and agreed to proceed.   History of Present Illness:  Past Medical History:  Diagnosis Date  . Abscess of right foot    abscess/ulcer of R transtibial amputation requiring IV abx  . AIDS ([place])   . Chronic anemia   . Chronic knee pain    right  . Chronic pain   . CKD (chronic kidney disease), stage II   . Diabetes type 2, uncontrolled ([place])    HgA1c 17.6 (04/27/2010)  . Diabetic foot ulcer ([place]) 01/2017   right foot  . Erectile dysfunction   . Genital warts   . HIV (human immunodeficiency virus infection) ([place]) 2009   CD4 count 100, VL 13800 (05/01/2010)  . Hypertension   . Osteomyelitis ([place])    h/o hand  . Osteomyelitis of metatarsal ([place]) 04/28/2017   Past Surgical History:  Procedure Laterality Date  . AMPUTATION Right 05/02/2017   Procedure: AMPUTATION TRANSMETARSAL;  Surgeon: [name], MD;  Location: [facility];  Service: Orthopedics;  Laterality: Right;  . AMPUTATION Right 06/13/2017   Procedure: RIGHT BELOW KNEE AMPUTATION;  Surgeon: [name], MD;  Location: [facility];  Service: Orthopedics;  Laterality: Right;  . BELOW KNEE LEG AMPUTATION Right 06/13/2017  . BELOW KNEE LEG AMPUTATION    . CORONARY STENT INTERVENTION N/A 11/10/2018   Procedure: CORONARY STENT INTERVENTION;  Surgeon: Leonie Man, MD;  Location: [facility];  Service: Cardiovascular;  Laterality: N/A;  . HERNIA REPAIR    . I & D EXTREMITY Left 08/21/2014   Procedure: INCISION AND DRAINAGE LEFT  SMALL FINGER;  Surgeon: Leanora Cover, MD;  Location: [facility];  Service: Orthopedics;  Laterality: Left;  . I & D EXTREMITY Right 03/18/2017   Procedure: IRRIGATION AND DEBRIDEMENT EXTREMITY;  Surgeon: [name], MD;  Location: [facility];  Service: Orthopedics;  Laterality: Right;  . LEFT HEART CATH AND CORONARY ANGIOGRAPHY N/A 11/10/2018   Procedure: LEFT HEART CATH AND CORONARY ANGIOGRAPHY;  Surgeon: Leonie Man, MD;  Location: [facility];  Service: Cardiovascular;  Laterality: N/A;  . MINOR IRRIGATION AND DEBRIDEMENT OF WOUND Right 04/22/2014   Procedure: IRRIGATION AND DEBRIDEMENT OF RIGHT NECK ABCESS;  Surgeon: Jerrell Belfast, MD;  Location: [facility];  Service: ENT;  Laterality: Right;  . MULTIPLE EXTRACTIONS WITH ALVEOLOPLASTY N/A 01/18/2013   Procedure: MULTIPLE EXTRACION 3, 6, 7, 10, 11, 13, 21, 22, 27, 28, 29, 30 WITH ALVEOLOPLASTY;  Surgeon: Gae Bon, DDS;  Location: [facility];  Service: Oral Surgery;  Laterality: N/A;  . SKIN SPLIT GRAFT Right 03/21/2017   Procedure: IRRIGATION AND DEBRIDEMENT RIGHT FOOT AND APPLY SPLIT THICKNESS SKIN GRAFT AND WOUND VAC;  Surgeon: [name], MD;  Location: [facility];  Service: Orthopedics;  Laterality: Right;  . TEE WITHOUT CARDIOVERSION N/A 05/02/2017   Procedure: TRANSESOPHAGEAL ECHOCARDIOGRAM (TEE);  Surgeon: Acie Fredrickson Wonda Cheng, MD;  Location[facility] OR;  Service: Cardiovascular;  Laterality: N/A;  coincidental to orthopedic case    Social History   Tobacco Use  .  Smoking status: Never Smoker  . Smokeless tobacco: Never Used  Substance Use Topics  . Alcohol use: Not Currently  . Drug use: Not Currently    Comment: OTC ASA    Family History  Problem Relation Age of Onset  . Hypertension Mother   . Arthritis Father   . Hypertension Father   . Hypertension Brother   . Cancer Maternal Grandmother 31       unknown type of cancer  . Depression Paternal Grandmother     Allergies  Allergen Reactions  . Sulfur Itching    Patient stated  he's allergic to "sulfur" AND "sulfa"  . Sulfa Antibiotics Itching     Current Status: Since his last office visit, he is doing well with no complaints. His most recent normal range of preprandial blood glucose levels have been between 100-110. He has seen low range of 98 and high of 120 since his last office visit. He denies fatigue, frequent urination, blurred vision, excessive hunger, excessive thirst, weight gain, weight loss, and poor wound healing. He has history of right BKA. He continues to check his left foot regularly. He states that he was told to leave work because of nausea and vomiting incidence. He has no reports of blood in stools, dysuria and hematuria.He has c/o cough. He denies visual changes, chest pain, shortness of breath, heart palpitations, and falls. He has occasional headaches and dizziness with position changes. Denies severe headaches, confusion, seizures, double vision, and blurred vision, nausea and vomiting. His anxiety is mild today. He denies suicidal ideations, homicidal ideations, or auditory hallucinations. He denies fevers, chills, recent infections, weight loss, and night sweats. He denies pain today.   Observations/Objective:  Telephone Virtual Visit.   Assessment and Plan:  1. Nausea vomiting and diarrhea Stable today.   2. Uncontrolled type 2 diabetes mellitus with hyperglycemia ([place]) He will continue medication as prescribed, to decrease foods/beverages high in sugars and carbs and follow Heart Healthy or DASH diet. Increase physical activity to at least 30 minutes cardio exercise daily.   3. Hemoglobin A1C between 7% and 9% indicating borderline diabetic control  4. Hx of BKA, right ([place])  5. Hyperglycemia  6. Anxiety Stable.  7. Follow up He will follow up for office visit in 1 month.  No orders of the defined types were placed in this encounter.   No orders of the defined types were placed in this encounter.   Referral Orders  No  referral(s) requested today    Kathe Becton,  MSN, FNP-BC [facility] [address] [place], [place] [address] [phone]- fax   I discussed the assessment and treatment plan with the patient. The patient was provided an opportunity to ask questions and all were answered. The patient agreed with the plan and demonstrated an understanding of the instructions.   The patient was advised to call back or seek an in-person evaluation if the symptoms worsen or if the condition fails to improve as anticipated.  I provided 20 minutes of non-face-to-face time during this encounter.   [name], FNP

## 2019-05-18 ENCOUNTER — Telehealth: Payer: Self-pay | Admitting: Family Medicine

## 2019-05-18 ENCOUNTER — Telehealth: Payer: Self-pay

## 2019-05-18 NOTE — Telephone Encounter (Signed)
Patient stated he tested positive for COVID on 05/15/2019. Today he only has a runny nose and diarrhea. No other symptoms. Please advise.

## 2019-05-18 NOTE — Telephone Encounter (Signed)
Mr. Bunker COVID test is in Care Everywhere.

## 2019-05-20 ENCOUNTER — Telehealth: Payer: Self-pay | Admitting: Family Medicine

## 2019-05-20 NOTE — Telephone Encounter (Signed)
Please call pt back.

## 2019-05-20 NOTE — Telephone Encounter (Signed)
Keith [name] is doing better after COVID infection. He is still taking Nyquil for the cough. And coughing up white mucus. NO wheezing,  NO chest discomfort. Please advise.

## 2019-05-24 DIAGNOSIS — R05 Cough: Secondary | ICD-10-CM | POA: Diagnosis not present

## 2019-05-24 DIAGNOSIS — J101 Influenza due to other identified influenza virus with other respiratory manifestations: Secondary | ICD-10-CM | POA: Diagnosis not present

## 2019-05-24 DIAGNOSIS — Z20822 Contact with and (suspected) exposure to covid-19: Secondary | ICD-10-CM | POA: Diagnosis not present

## 2019-05-29 DIAGNOSIS — Z658 Other specified problems related to psychosocial circumstances: Secondary | ICD-10-CM | POA: Diagnosis not present

## 2019-05-29 DIAGNOSIS — F329 Major depressive disorder, single episode, unspecified: Secondary | ICD-10-CM | POA: Diagnosis not present

## 2019-05-29 DIAGNOSIS — R69 Illness, unspecified: Secondary | ICD-10-CM | POA: Diagnosis not present

## 2019-06-02 DIAGNOSIS — Z79899 Other long term (current) drug therapy: Secondary | ICD-10-CM | POA: Diagnosis not present

## 2019-06-02 DIAGNOSIS — M545 Low back pain: Secondary | ICD-10-CM | POA: Diagnosis not present

## 2019-06-11 ENCOUNTER — Ambulatory Visit: Payer: Medicare HMO | Admitting: Family Medicine

## 2019-06-30 DIAGNOSIS — E118 Type 2 diabetes mellitus with unspecified complications: Secondary | ICD-10-CM | POA: Diagnosis not present

## 2019-06-30 DIAGNOSIS — M545 Low back pain: Secondary | ICD-10-CM | POA: Diagnosis not present

## 2019-06-30 DIAGNOSIS — Z79899 Other long term (current) drug therapy: Secondary | ICD-10-CM | POA: Diagnosis not present

## 2019-06-30 DIAGNOSIS — R69 Illness, unspecified: Secondary | ICD-10-CM | POA: Diagnosis not present

## 2019-07-06 ENCOUNTER — Encounter: Payer: Self-pay | Admitting: Family

## 2019-07-06 ENCOUNTER — Other Ambulatory Visit: Payer: Self-pay

## 2019-07-06 ENCOUNTER — Ambulatory Visit (INDEPENDENT_AMBULATORY_CARE_PROVIDER_SITE_OTHER): Payer: Medicare HMO | Admitting: Family

## 2019-07-06 VITALS — Ht 70.0 in | Wt 263.0 lb

## 2019-07-06 DIAGNOSIS — L89899 Pressure ulcer of other site, unspecified stage: Secondary | ICD-10-CM | POA: Diagnosis not present

## 2019-07-06 DIAGNOSIS — T8789 Other complications of amputation stump: Secondary | ICD-10-CM

## 2019-07-06 NOTE — Progress Notes (Signed)
Office Visit Note   Patient: Keith [name]           Date of Birth: 1966/03/18           MRN: [id number] Visit Date: 07/06/2019              Requested by: No referring provider defined for this encounter. PCP: System, Pcp Not In  Chief Complaint  Patient presents with  . Right Leg - Pain    Hx BKA 2019      HPI: Patient patient is a 54 year old gentleman who has 2 years status post right transtibial amputation patient works standing on his feet he complains of a painful ulcer over the end of the residual limb.  Patient denies any drainage.  Patient states his leg is sweating a lot this well.  Assessment & Plan: Visit Diagnoses:  1. Pressure ulcer of stump of below knee amputation ([place])     Plan: Recommended minimizing his use of the prosthesis use multiple socks to decrease the end bearing of the ulcer.  Patient was given a prescription for biotech for a new socket liner materials and supplies.  Patient is a K3 level ambulator.  Follow-Up Instructions: Return in about 4 weeks (around 08/03/2019).   Ortho Exam  Patient is alert, oriented, no adenopathy, well-dressed, normal affect, normal respiratory effort. Examination patient has an ulcer 3 cm in diameter with granulation tissue at the base of the wound with hypertrophic callus around the edges.  Patient has foul-smelling  Drainage he denies any fever no cellulitis no signs of any deep infection.  Patient's residual limb has significant atrophy and is much smaller than the current socket and too small to use extra ply socks.  Patient is an existing right transtibial  amputee.  Patient's current comorbidities are not expected to impact the ability to function with the prescribed prosthesis. Patient verbally communicates a strong desire to use a prosthesis. Patient currently requires mobility aids to ambulate without a prosthesis.  Expects not to use mobility aids with a new prosthesis.  Patient is a K3 level ambulator  that spends a lot of time walking around on uneven terrain over obstacles, up and down stairs, and ambulates with a variable cadence.     Labs: Lab Results  Component Value Date   HGBA1C 8.1 (A) 01/13/2019   HGBA1C >15.5 (H) 09/07/2018   HGBA1C 15.0 (H) 08/07/2018   ESRSEDRATE 130 (H) 11/02/2018   ESRSEDRATE 140 (H) 10/30/2018   ESRSEDRATE 74 (H) 06/06/2018   CRP <0.8 11/02/2018   CRP 2.0 (H) 10/30/2018   CRP <0.8 06/06/2018   LABURIC 6.3 08/07/2009   REPTSTATUS 11/08/2018 FINAL 11/03/2018   REPTSTATUS 11/08/2018 FINAL 11/03/2018   GRAMSTAIN  04/29/2017    NO WBC SEEN RARE GRAM POSITIVE COCCI IN PAIRS IN SINGLES    CULT  11/03/2018    NO GROWTH 5 DAYS Performed at [facility], [place] [address] [place], [place] [address]    CULT  11/03/2018    NO GROWTH 5 DAYS Performed at [facility] [address] [place], [place] [address]    LABORGA METHICILLIN RESISTANT STAPHYLOCOCCUS AUREUS 04/29/2017     Lab Results  Component Value Date   ALBUMIN 3.6 02/07/2019   ALBUMIN 2.8 (L) 11/10/2018   ALBUMIN 2.8 (L) 10/25/2018   PREALBUMIN 25.5 06/06/2018   PREALBUMIN 7.4 (L) 03/15/2017   LABURIC 6.3 08/07/2009    Lab Results  Component Value Date   MG 2.2  08/08/2018   MG 1.7 04/28/2017   MG 2.0 01/08/2017   Lab Results  Component Value Date   VD25OH 8.2 (L) 09/07/2018    Lab Results  Component Value Date   PREALBUMIN 25.5 06/06/2018   PREALBUMIN 7.4 (L) 03/15/2017   CBC EXTENDED Latest Ref Rng & Units 02/07/2019 11/13/2018 11/12/2018  WBC 4.0 - 10.5 K/uL 5.4 6.4 8.8  RBC 4.22 - 5.81 MIL/uL 4.29 3.60(L) 3.45(L)  HGB 13.0 - 17.0 g/dL 11.2(L) 9.3(L) 8.8(L)  HCT 39.0 - 52.0 % 36.0(L) 30.2(L) 28.3(L)  PLT 150 - 400 K/uL 239 341 342  NEUTROABS 1.7 - 7.7 K/uL 2.3 - -  LYMPHSABS 0.7 - 4.0 K/uL 2.4 - -     Body mass index is 37.74 kg/m.  Orders:  No orders of the defined types were placed in this encounter.  No orders of the defined types were placed in  this encounter.    Procedures: No procedures performed  Clinical Data: No additional findings.  ROS:  All other systems negative, except as noted in the HPI. Review of Systems  Objective: Vital Signs: Ht 5\' 10"  (1.778 m)   Wt 263 lb (119.3 kg)   BMI 37.74 kg/m   Specialty Comments:  No specialty comments available.  PMFS History: Patient Active Problem List   Diagnosis Date Noted  . Uncontrolled type 2 diabetes mellitus with hyperglycemia ([place]) 01/14/2019  . Elevated glucose 01/14/2019  . Hemoglobin A1C between 7% and 9% indicating borderline diabetic control 01/14/2019  . Hx of BKA, right ([place]) 01/14/2019  . Pressure injury of skin 11/11/2018  . Acute myocardial infarction ([place]) 11/10/2018  . CKD (chronic kidney disease), stage II 11/10/2018  . Amputation stump infection ([place]) 10/28/2018  . Type II diabetes mellitus with renal manifestations ([place]) 08/07/2018  . Uncontrolled type 2 diabetes mellitus with hyperosmolar nonketotic hyperglycemia ([place]) 08/07/2018  . Non-pressure chronic ulcer of right calf limited to breakdown of skin ([place]) 07/06/2018  . Type 2 diabetes mellitus without complication, without long-term current use of insulin ([place]) 06/15/2018  . Chronic low back pain without sciatica 06/15/2018  . Idiopathic chronic venous hypertension of left lower extremity with ulcer and inflammation ([place])   . Venous stasis ulcer of left calf limited to breakdown of skin without varicose veins ([place]) 04/23/2018  . Acquired absence of right leg below knee ([place]) 06/13/2017  . Acute renal failure superimposed on stage 3 chronic kidney disease ([place]) 03/15/2017  . Diabetic polyneuropathy associated with type 2 diabetes mellitus ([place]) 01/23/2017  . AKI (acute kidney injury) ([place]) 09/28/2016  . Nausea vomiting and diarrhea 09/28/2016  . Onychomycosis of multiple toenails with type 2 diabetes mellitus ([place]) 08/29/2015  . MRSA carrier 04/20/2014  . Penile wart 02/22/2014  . HIV  disease ([place])   . Insulin-requiring or dependent type II diabetes mellitus ([place]) 02/04/2014  . Dental anomaly 11/20/2012  . Arthritis of right knee 02/23/2012  . Hyperlipidemia 11/10/2011  . Chronic pain 08/07/2011  . Meralgia paraesthetica 04/23/2011  . ERECTILE DYSFUNCTION 08/22/2008  . Essential hypertension 05/19/2008   Past Medical History:  Diagnosis Date  . Abscess of right foot    abscess/ulcer of R transtibial amputation requiring IV abx  . AIDS ([place])   . Chronic anemia   . Chronic knee pain    right  . Chronic pain   . CKD (chronic kidney disease), stage II   . Diabetes type 2, uncontrolled ([place])    HgA1c 17.6 (04/27/2010)  . Diabetic foot ulcer ([place]) 01/2017  right foot  . Erectile dysfunction   . Genital warts   . HIV (human immunodeficiency virus infection) ([place]) 2009   CD4 count 100, VL 13800 (05/01/2010)  . Hypertension   . Osteomyelitis ([place])    h/o hand  . Osteomyelitis of metatarsal ([place]) 04/28/2017    Family History  Problem Relation Age of Onset  . Hypertension Mother   . Arthritis Father   . Hypertension Father   . Hypertension Brother   . Cancer Maternal Grandmother 50       unknown type of cancer  . Depression Paternal Grandmother     Past Surgical History:  Procedure Laterality Date  . AMPUTATION Right 05/02/2017   Procedure: AMPUTATION TRANSMETARSAL;  Surgeon: [name], MD;  Location: [facility];  Service: Orthopedics;  Laterality: Right;  . AMPUTATION Right 06/13/2017   Procedure: RIGHT BELOW KNEE AMPUTATION;  Surgeon: [name], MD;  Location: [facility];  Service: Orthopedics;  Laterality: Right;  . BELOW KNEE LEG AMPUTATION Right 06/13/2017  . BELOW KNEE LEG AMPUTATION    . CORONARY STENT INTERVENTION N/A 11/10/2018   Procedure: CORONARY STENT INTERVENTION;  Surgeon: Leonie Man, MD;  Location: [facility];  Service: Cardiovascular;  Laterality: N/A;  . HERNIA REPAIR    . I & D EXTREMITY Left 08/21/2014   Procedure: INCISION  AND DRAINAGE LEFT SMALL FINGER;  Surgeon: Leanora Cover, MD;  Location: [facility];  Service: Orthopedics;  Laterality: Left;  . I & D EXTREMITY Right 03/18/2017   Procedure: IRRIGATION AND DEBRIDEMENT EXTREMITY;  Surgeon: [name], MD;  Location: [facility];  Service: Orthopedics;  Laterality: Right;  . LEFT HEART CATH AND CORONARY ANGIOGRAPHY N/A 11/10/2018   Procedure: LEFT HEART CATH AND CORONARY ANGIOGRAPHY;  Surgeon: Leonie Man, MD;  Location: [facility];  Service: Cardiovascular;  Laterality: N/A;  . MINOR IRRIGATION AND DEBRIDEMENT OF WOUND Right 04/22/2014   Procedure: IRRIGATION AND DEBRIDEMENT OF RIGHT NECK ABCESS;  Surgeon: Jerrell Belfast, MD;  Location: [facility];  Service: ENT;  Laterality: Right;  . MULTIPLE EXTRACTIONS WITH ALVEOLOPLASTY N/A 01/18/2013   Procedure: MULTIPLE EXTRACION 3, 6, 7, 10, 11, 13, 21, 22, 27, 28, 29, 30 WITH ALVEOLOPLASTY;  Surgeon: Gae Bon, DDS;  Location: [facility];  Service: Oral Surgery;  Laterality: N/A;  . SKIN SPLIT GRAFT Right 03/21/2017   Procedure: IRRIGATION AND DEBRIDEMENT RIGHT FOOT AND APPLY SPLIT THICKNESS SKIN GRAFT AND WOUND VAC;  Surgeon: [name], MD;  Location: [facility];  Service: Orthopedics;  Laterality: Right;  . TEE WITHOUT CARDIOVERSION N/A 05/02/2017   Procedure: TRANSESOPHAGEAL ECHOCARDIOGRAM (TEE);  Surgeon: Acie Fredrickson Wonda Cheng, MD;  Location: [facility];  Service: Cardiovascular;  Laterality: N/A;  coincidental to orthopedic case   Social History   Occupational History  . Occupation: disabled  Tobacco Use  . Smoking status: Never Smoker  . Smokeless tobacco: Never Used  Substance and Sexual Activity  . Alcohol use: Not Currently  . Drug use: Not Currently    Comment: OTC ASA  . Sexual activity: Yes    Partners: Female    Comment: given condoms

## 2019-07-20 DIAGNOSIS — Z89511 Acquired absence of right leg below knee: Secondary | ICD-10-CM | POA: Diagnosis not present

## 2019-07-26 DIAGNOSIS — E118 Type 2 diabetes mellitus with unspecified complications: Secondary | ICD-10-CM | POA: Diagnosis not present

## 2019-07-26 DIAGNOSIS — F1190 Opioid use, unspecified, uncomplicated: Secondary | ICD-10-CM | POA: Diagnosis not present

## 2019-07-26 DIAGNOSIS — Z79899 Other long term (current) drug therapy: Secondary | ICD-10-CM | POA: Diagnosis not present

## 2019-07-26 DIAGNOSIS — M545 Low back pain: Secondary | ICD-10-CM | POA: Diagnosis not present

## 2019-07-26 DIAGNOSIS — R69 Illness, unspecified: Secondary | ICD-10-CM | POA: Diagnosis not present

## 2019-07-30 ENCOUNTER — Telehealth: Payer: Self-pay | Admitting: Family Medicine

## 2019-07-30 NOTE — Telephone Encounter (Signed)
The call to Richardson Landry has been returned. [facility]'s patient profile does not have any notes on a call back. I will contact [facility] for more details.   [facility], [place] - [address]  [place], [place] [address]  Phone:  [phone] Fax:  [phone]  DEA #:  [id number]

## 2019-08-18 ENCOUNTER — Other Ambulatory Visit: Payer: Medicare HMO

## 2019-08-19 DIAGNOSIS — R69 Illness, unspecified: Secondary | ICD-10-CM | POA: Diagnosis not present

## 2019-08-19 DIAGNOSIS — G546 Phantom limb syndrome with pain: Secondary | ICD-10-CM | POA: Diagnosis not present

## 2019-08-19 DIAGNOSIS — M545 Low back pain: Secondary | ICD-10-CM | POA: Diagnosis not present

## 2019-08-19 DIAGNOSIS — Z79899 Other long term (current) drug therapy: Secondary | ICD-10-CM | POA: Diagnosis not present

## 2019-08-23 ENCOUNTER — Other Ambulatory Visit: Payer: Self-pay

## 2019-08-23 ENCOUNTER — Emergency Department (HOSPITAL_COMMUNITY)
Admission: EM | Admit: 2019-08-23 | Discharge: 2019-08-23 | Disposition: A | Discharge status: Home / Self Care | Payer: Medicare HMO | Attending: Emergency Medicine | Admitting: Emergency Medicine

## 2019-08-23 ENCOUNTER — Encounter (HOSPITAL_COMMUNITY): Payer: Self-pay | Admitting: Emergency Medicine

## 2019-08-23 ENCOUNTER — Emergency Department (HOSPITAL_COMMUNITY): Payer: Medicare HMO

## 2019-08-23 DIAGNOSIS — N182 Chronic kidney disease, stage 2 (mild): Secondary | ICD-10-CM | POA: Diagnosis not present

## 2019-08-23 DIAGNOSIS — R7989 Other specified abnormal findings of blood chemistry: Secondary | ICD-10-CM

## 2019-08-23 DIAGNOSIS — I129 Hypertensive chronic kidney disease with stage 1 through stage 4 chronic kidney disease, or unspecified chronic kidney disease: Secondary | ICD-10-CM | POA: Diagnosis not present

## 2019-08-23 DIAGNOSIS — J189 Pneumonia, unspecified organism: Secondary | ICD-10-CM | POA: Insufficient documentation

## 2019-08-23 DIAGNOSIS — J029 Acute pharyngitis, unspecified: Secondary | ICD-10-CM | POA: Diagnosis present

## 2019-08-23 DIAGNOSIS — Z20822 Contact with and (suspected) exposure to covid-19: Secondary | ICD-10-CM | POA: Insufficient documentation

## 2019-08-23 DIAGNOSIS — B20 Human immunodeficiency virus [HIV] disease: Secondary | ICD-10-CM | POA: Insufficient documentation

## 2019-08-23 DIAGNOSIS — E1122 Type 2 diabetes mellitus with diabetic chronic kidney disease: Secondary | ICD-10-CM | POA: Insufficient documentation

## 2019-08-23 DIAGNOSIS — Z79899 Other long term (current) drug therapy: Secondary | ICD-10-CM | POA: Insufficient documentation

## 2019-08-23 DIAGNOSIS — J020 Streptococcal pharyngitis: Secondary | ICD-10-CM | POA: Insufficient documentation

## 2019-08-23 DIAGNOSIS — Z7982 Long term (current) use of aspirin: Secondary | ICD-10-CM | POA: Insufficient documentation

## 2019-08-23 DIAGNOSIS — Z794 Long term (current) use of insulin: Secondary | ICD-10-CM | POA: Diagnosis not present

## 2019-08-23 DIAGNOSIS — R05 Cough: Secondary | ICD-10-CM | POA: Diagnosis not present

## 2019-08-23 DIAGNOSIS — R69 Illness, unspecified: Secondary | ICD-10-CM | POA: Diagnosis not present

## 2019-08-23 LAB — POC SARS CORONAVIRUS 2 AG -  ED: SARS Coronavirus 2 Ag: NEGATIVE

## 2019-08-23 LAB — COMPREHENSIVE METABOLIC PANEL
ALT: 23 U/L (ref 0–44)
AST: 26 U/L (ref 15–41)
Albumin: 3.1 g/dL — ABNORMAL LOW (ref 3.5–5.0)
Alkaline Phosphatase: 80 U/L (ref 38–126)
Anion gap: 8 (ref 5–15)
BUN: 32 mg/dL — ABNORMAL HIGH (ref 6–20)
CO2: 22 mmol/L (ref 22–32)
Calcium: 8.9 mg/dL (ref 8.9–10.3)
Chloride: 106 mmol/L (ref 98–111)
Creatinine, Ser: 2.34 mg/dL — ABNORMAL HIGH (ref 0.61–1.24)
GFR calc Af Amer: 35 mL/min — ABNORMAL LOW (ref >60)
GFR calc non Af Amer: 30 mL/min — ABNORMAL LOW (ref >60)
Glucose, Bld: 123 mg/dL — ABNORMAL HIGH (ref 70–99)
Potassium: 4.8 mmol/L (ref 3.5–5.1)
Sodium: 136 mmol/L (ref 135–145)
Total Bilirubin: 0.3 mg/dL (ref 0.3–1.2)
Total Protein: 8.7 g/dL — ABNORMAL HIGH (ref 6.5–8.1)

## 2019-08-23 LAB — CBC WITH DIFFERENTIAL/PLATELET
Abs Immature Granulocytes: 0.05 10*3/uL (ref 0.00–0.07)
Basophils Absolute: 0 10*3/uL (ref 0.0–0.1)
Basophils Relative: 0 %
Eosinophils Absolute: 0.2 10*3/uL (ref 0.0–0.5)
Eosinophils Relative: 2 %
HCT: 36.1 % — ABNORMAL LOW (ref 39.0–52.0)
Hemoglobin: 11.2 g/dL — ABNORMAL LOW (ref 13.0–17.0)
Immature Granulocytes: 0 %
Lymphocytes Relative: 34 %
Lymphs Abs: 4 10*3/uL (ref 0.7–4.0)
MCH: 26.3 pg (ref 26.0–34.0)
MCHC: 31 g/dL (ref 30.0–36.0)
MCV: 84.7 fL (ref 80.0–100.0)
Monocytes Absolute: 0.9 10*3/uL (ref 0.1–1.0)
Monocytes Relative: 7 %
Neutro Abs: 6.6 10*3/uL (ref 1.7–7.7)
Neutrophils Relative %: 57 %
Platelets: 244 10*3/uL (ref 150–400)
RBC: 4.26 MIL/uL (ref 4.22–5.81)
RDW: 13.8 % (ref 11.5–15.5)
WBC: 11.7 10*3/uL — ABNORMAL HIGH (ref 4.0–10.5)
nRBC: 0 % (ref 0.0–0.2)

## 2019-08-23 LAB — CBG MONITORING, ED: Glucose-Capillary: 110 mg/dL — ABNORMAL HIGH (ref 70–99)

## 2019-08-23 LAB — LACTIC ACID, PLASMA
Lactic Acid, Venous: 0.7 mmol/L (ref 0.5–1.9)
Lactic Acid, Venous: 0.8 mmol/L (ref 0.5–1.9)

## 2019-08-23 LAB — GROUP A STREP BY PCR: Group A Strep by PCR: DETECTED — AB

## 2019-08-23 MED ORDER — ACETAMINOPHEN 325 MG PO TABS
650.0000 mg | ORAL_TABLET | Freq: Once | ORAL | Status: AC
  Administered 2019-08-23: 650 mg via ORAL
  Filled 2019-08-23: qty 2

## 2019-08-23 MED ORDER — AMOXICILLIN-POT CLAVULANATE ER 1000-62.5 MG PO TB12
2.0000 | ORAL_TABLET | Freq: Two times a day (BID) | ORAL | Status: DC
  Administered 2019-08-23: 2 via ORAL
  Filled 2019-08-23 (×2): qty 2

## 2019-08-23 MED ORDER — AMOXICILLIN 500 MG PO CAPS: 500.0000 mg | ORAL_CAPSULE | Freq: Once | ORAL | Status: DC

## 2019-08-23 MED ORDER — SODIUM CHLORIDE 0.9 % IV BOLUS
1000.0000 mL | Freq: Once | INTRAVENOUS | Status: AC
  Administered 2019-08-23: 1000 mL via INTRAVENOUS

## 2019-08-23 MED ORDER — AMOXICILLIN-POT CLAVULANATE 875-125 MG PO TABS: 1.0000 | ORAL_TABLET | Freq: Once | ORAL | Status: DC

## 2019-08-23 MED ORDER — DOXYCYCLINE HYCLATE 100 MG PO TABS
100.0000 mg | ORAL_TABLET | Freq: Once | ORAL | Status: AC
  Administered 2019-08-23: 100 mg via ORAL
  Filled 2019-08-23: qty 1

## 2019-08-23 MED ORDER — SODIUM CHLORIDE 0.9 % IV BOLUS
500.0000 mL | Freq: Once | INTRAVENOUS | Status: AC
  Administered 2019-08-23: 500 mL via INTRAVENOUS

## 2019-08-23 MED ORDER — DOXYCYCLINE HYCLATE 100 MG PO CAPS
100.0000 mg | ORAL_CAPSULE | Freq: Two times a day (BID) | ORAL | 0 refills | Status: AC
Start: 2019-08-23 — End: 2019-08-30

## 2019-08-23 MED ORDER — AMOXICILLIN-POT CLAVULANATE ER 1000-62.5 MG PO TB12
2.0000 | ORAL_TABLET | Freq: Two times a day (BID) | ORAL | 0 refills | Status: AC
Start: 2019-08-23 — End: 2019-08-30

## 2019-08-23 NOTE — ED Provider Notes (Signed)
[facility] EMERGENCY DEPARTMENT Provider Note   CSN: [id number] Arrival date & time: 08/23/19  0550     History Chief Complaint  Patient presents with  . Sore Throat  . Cough  . Fever    Keith [name] is a 54 y.o. male history of anxiety HIV, Hypertension, Diabetes, CKD.  Patient presents for concern of possible strep throat.  Patient reports that last night he developed a mild sore throat described as a bilateral scratchy sensation constant worsened with swallowing nonradiating no alleviating factors.  He reports this feels exactly like previous episodes of strep throat.  Additionally patient reports that he has developed a cough over the past 1 day described as a mild nonproductive cough without associated chest pain or shortness of breath.  Patient denies having fever pta.  Denies headache, vision changes, neck stiffness, throat swelling, drooling, difficulty swallowing, chest pain/shortness of breath, hemoptysis, abdominal pain, nausea/vomiting, extremity swelling/color change, fall/injury or any additional concerns.  HPI     Past Medical History:  Diagnosis Date  . Abscess of right foot    abscess/ulcer of R transtibial amputation requiring IV abx  . AIDS ([place])   . Chronic anemia   . Chronic knee pain    right  . Chronic pain   . CKD (chronic kidney disease), stage II   . Diabetes type 2, uncontrolled ([place])    HgA1c 17.6 (04/27/2010)  . Diabetic foot ulcer ([place]) 01/2017   right foot  . Erectile dysfunction   . Genital warts   . HIV (human immunodeficiency virus infection) ([place]) 2009   CD4 count 100, VL 13800 (05/01/2010)  . Hypertension   . Osteomyelitis ([place])    h/o hand  . Osteomyelitis of metatarsal ([place]) 04/28/2017    Patient Active Problem List   Diagnosis Date Noted  . Uncontrolled type 2 diabetes mellitus with hyperglycemia ([place]) 01/14/2019  . Elevated glucose 01/14/2019  . Hemoglobin A1C between 7% and 9% indicating borderline  diabetic control 01/14/2019  . Hx of BKA, right ([place]) 01/14/2019  . Pressure injury of skin 11/11/2018  . Acute myocardial infarction ([place]) 11/10/2018  . CKD (chronic kidney disease), stage II 11/10/2018  . Amputation stump infection ([place]) 10/28/2018  . Type II diabetes mellitus with renal manifestations ([place]) 08/07/2018  . Uncontrolled type 2 diabetes mellitus with hyperosmolar nonketotic hyperglycemia ([place]) 08/07/2018  . Non-pressure chronic ulcer of right calf limited to breakdown of skin ([name]) 07/06/2018  . Type 2 diabetes mellitus without complication, without long-term current use of insulin ([place]) 06/15/2018  . Chronic low back pain without sciatica 06/15/2018  . Idiopathic chronic venous hypertension of left lower extremity with ulcer and inflammation ([place])   . Venous stasis ulcer of left calf limited to breakdown of skin without varicose veins ([place]) 04/23/2018  . Acquired absence of right leg below knee ([place]) 06/13/2017  . Acute renal failure superimposed on stage 3 chronic kidney disease ([place]) 03/15/2017  . Diabetic polyneuropathy associated with type 2 diabetes mellitus ([place]) 01/23/2017  . AKI (acute kidney injury) ([place]) 09/28/2016  . Nausea vomiting and diarrhea 09/28/2016  . Onychomycosis of multiple toenails with type 2 diabetes mellitus ([place]) 08/29/2015  . MRSA carrier 04/20/2014  . Penile wart 02/22/2014  . HIV disease ([place])   . Insulin-requiring or dependent type II diabetes mellitus ([place]) 02/04/2014  . Dental anomaly 11/20/2012  . Arthritis of right knee 02/23/2012  . Hyperlipidemia 11/10/2011  . Chronic pain 08/07/2011  . Meralgia paraesthetica 04/23/2011  . ERECTILE  DYSFUNCTION 08/22/2008  . Essential hypertension 05/19/2008    Past Surgical History:  Procedure Laterality Date  . AMPUTATION Right 05/02/2017   Procedure: AMPUTATION TRANSMETARSAL;  Surgeon: [name], MD;  Location: [facility];  Service: Orthopedics;  Laterality: Right;  . AMPUTATION Right 06/13/2017    Procedure: RIGHT BELOW KNEE AMPUTATION;  Surgeon: [name], MD;  Location: [facility];  Service: Orthopedics;  Laterality: Right;  . BELOW KNEE LEG AMPUTATION Right 06/13/2017  . BELOW KNEE LEG AMPUTATION    . CORONARY STENT INTERVENTION N/A 11/10/2018   Procedure: CORONARY STENT INTERVENTION;  Surgeon: Leonie Man, MD;  Location: [facility];  Service: Cardiovascular;  Laterality: N/A;  . HERNIA REPAIR    . I & D EXTREMITY Left 08/21/2014   Procedure: INCISION AND DRAINAGE LEFT SMALL FINGER;  Surgeon: Leanora Cover, MD;  Location: [facility];  Service: Orthopedics;  Laterality: Left;  . I & D EXTREMITY Right 03/18/2017   Procedure: IRRIGATION AND DEBRIDEMENT EXTREMITY;  Surgeon: [name], MD;  Location: [facility];  Service: Orthopedics;  Laterality: Right;  . LEFT HEART CATH AND CORONARY ANGIOGRAPHY N/A 11/10/2018   Procedure: LEFT HEART CATH AND CORONARY ANGIOGRAPHY;  Surgeon: Leonie Man, MD;  Location: [facility];  Service: Cardiovascular;  Laterality: N/A;  . MINOR IRRIGATION AND DEBRIDEMENT OF WOUND Right 04/22/2014   Procedure: IRRIGATION AND DEBRIDEMENT OF RIGHT NECK ABCESS;  Surgeon: Jerrell Belfast, MD;  Location: [facility];  Service: ENT;  Laterality: Right;  . MULTIPLE EXTRACTIONS WITH ALVEOLOPLASTY N/A 01/18/2013   Procedure: MULTIPLE EXTRACION 3, 6, 7, 10, 11, 13, 21, 22, 27, 28, 29, 30 WITH ALVEOLOPLASTY;  Surgeon: Gae Bon, DDS;  Location: [facility];  Service: Oral Surgery;  Laterality: N/A;  . SKIN SPLIT GRAFT Right 03/21/2017   Procedure: IRRIGATION AND DEBRIDEMENT RIGHT FOOT AND APPLY SPLIT THICKNESS SKIN GRAFT AND WOUND VAC;  Surgeon: [name], MD;  Location: [facility];  Service: Orthopedics;  Laterality: Right;  . TEE WITHOUT CARDIOVERSION N/A 05/02/2017   Procedure: TRANSESOPHAGEAL ECHOCARDIOGRAM (TEE);  Surgeon: Acie Fredrickson Wonda Cheng, MD;  [facility];  Service: Cardiovascular;  Laterality: N/A;  coincidental to orthopedic case       Family History    Problem Relation Age of Onset  . Hypertension Mother   . Arthritis Father   . Hypertension Father   . Hypertension Brother   . Cancer Maternal Grandmother 7       unknown type of cancer  . Depression Paternal Grandmother     Social History   Tobacco Use  . Smoking status: Never Smoker  . Smokeless tobacco: Never Used  Substance Use Topics  . Alcohol use: Not Currently  . Drug use: Not Currently    Comment: OTC ASA    Home Medications   Allergies    Sulfur and Sulfa antibiotics  Review of Systems   Review of Systems Ten systems are reviewed and are negative for acute change except as noted in the HPI  Physical Exam Updated Vital Signs BP (!) 135/91 (BP Location: Left Arm)   Pulse 78   Temp 99 F (37.2 C) (Oral)   Resp 20   Wt 122.5 kg   SpO2 100%   BMI 38.74 kg/m   Physical Exam Constitutional:      General: He is not in acute distress.    Appearance: Normal appearance. He is well-developed. He is not ill-appearing or diaphoretic.  HENT:  Head: Normocephalic and atraumatic.     Jaw: There is normal jaw occlusion. No trismus.     Right Ear: External ear normal.     Left Ear: External ear normal.     Nose: Nose normal.     Mouth/Throat:     Comments: The patient has normal phonation and is in control of secretions. No stridor.  Midline uvula without edema. Soft palate rises symmetrically. Mild bilateral tonsillar erythema, without swelling or exudates. Tongue protrusion is normal, floor of mouth is soft. No trismus. No creptius on neck palpation. No gingival erythema or fluctuance noted. Mucus membranes moist. No pallor noted. Eyes:     General: Vision grossly intact. Gaze aligned appropriately.     Pupils: Pupils are equal, round, and reactive to light.  Neck:     Trachea: Trachea and phonation normal. No tracheal deviation.  Cardiovascular:     Rate and Rhythm: Normal rate and regular rhythm.  Pulmonary:     Effort: Pulmonary effort is normal. No  accessory muscle usage or respiratory distress.     Breath sounds: Normal air entry.     Comments: Lung sounds minimally coarse Chest:     Chest wall: No deformity, tenderness or crepitus.  Abdominal:     General: There is no distension.     Palpations: Abdomen is soft.     Tenderness: There is no abdominal tenderness. There is no guarding or rebound.  Musculoskeletal:        General: Normal range of motion.     Cervical back: Full passive range of motion without pain, normal range of motion and neck supple. No edema or rigidity.  Skin:    General: Skin is warm and dry.  Neurological:     Mental Status: He is alert.     GCS: GCS eye subscore is 4. GCS verbal subscore is 5. GCS motor subscore is 6.     Comments: Speech is clear and goal oriented, follows commands Major Cranial nerves without deficit, no facial droop Moves extremities without ataxia, coordination intact  Psychiatric:        Behavior: Behavior normal.     ED Results / Procedures / Treatments   Labs (all labs ordered are listed, but only abnormal results are displayed) Labs Reviewed  GROUP A STREP BY PCR - Abnormal; Notable for the following components:      Result Value   Group A Strep by PCR DETECTED (*)    All other components within normal limits  CBC WITH DIFFERENTIAL/PLATELET - Abnormal; Notable for the following components:   WBC 11.7 (*)    Hemoglobin 11.2 (*)    HCT 36.1 (*)    All other components within normal limits  COMPREHENSIVE METABOLIC PANEL - Abnormal; Notable for the following components:   Glucose, Bld 123 (*)    BUN 32 (*)    Creatinine, Ser 2.34 (*)    Total Protein 8.7 (*)    Albumin 3.1 (*)    GFR calc non Af Amer 30 (*)    GFR calc Af Amer 35 (*)    All other components within normal limits  CBG MONITORING, ED - Abnormal; Notable for the following components:   Glucose-Capillary 110 (*)    All other components within normal limits  LACTIC ACID, PLASMA  LACTIC ACID, PLASMA  POC  SARS CORONAVIRUS 2 AG -  ED    EKG None  Radiology DG Chest Portable 1 View  Result Date: 08/23/2019 CLINICAL DATA:  Cough  for 1 day, COVID test pending EXAM: PORTABLE CHEST 1 VIEW COMPARISON:  11/10/2018 FINDINGS: Cardiomediastinal contours and hilar structures are normal. Subtle added density at the RIGHT lung base. No sign of effusion or dense consolidation. Visualized skeletal structures are unremarkable. IMPRESSION: Subtle added density at the RIGHT lung base may represent atelectasis or developing infection. Electronically Signed   By: Zetta Bills M.D.   On: 08/23/2019 09:23    Procedures Procedures (including critical care time)  Medications Ordered in ED Medications  amoxicillin-clavulanate (AUGMENTIN XR) 1000-62.5 MG per 12 hr tablet 2 tablet (2 tablets Oral Given 08/23/19 1118)  acetaminophen (TYLENOL) tablet 650 mg (650 mg Oral Given 08/23/19 1021)  sodium chloride 0.9 % bolus 1,000 mL (1,000 mLs Intravenous Bolus from Bag 08/23/19 1033)  sodium chloride 0.9 % bolus 500 mL (0 mLs Intravenous Stopped 08/23/19 1106)  doxycycline (VIBRA-TABS) tablet 100 mg (100 mg Oral Given 08/23/19 1104)    ED Course  I have reviewed the triage vital signs and the nursing notes.  Pertinent labs & imaging results that were available during my care of the patient were reviewed by me and considered in my medical decision making (see chart for details).  Clinical Course as of Aug 22 1329  Mon Aug 23, 2019  0840 Jun 06, 2018 CD4 T Cell Abs 400 - 2,700 /uL 140Low  CD4 % Helper T Cell 33 - 55 % 6Low      [BM]  [facility] Pharmacist   [BM]    Clinical Course User Index [BM] Gari Crown   MDM Rules/Calculators/A&P                      Brief Summary: 54 year old male with history as detailed above presents today with 1 day of sore throat and cough.  Reports feels very similar to strep pharyngitis she has had in the past.  Cough is nonproductive mild without associated chest  pain or shortness of breath.  Examination reveals mild erythema of the tonsils without exudate or swelling.  No evidence of PTA, RPA, Ludwig's or other emergent pathologies.  Cardiopulmonary examination reveals mildly coarse lung sounds, no respiratory distress.  No tachycardia, tachypnea or hypoxia on room air.  Blood pressure stable.  Patient noted to have fever of 101.0 F upon arrival.  Additional History Obtained: Nursing notes from this visit. Previous records within EMR system, most recent CD4 count was from June 06, 2018 and was low at 140 with percentage T helper cells at 6. -------------- Lab Tests: I ordered, reviewed and interpreted labs which include: CBC shows mild leukocytosis of 11.7 without left shift, anemia of 11.2 appears baseline. Covid test negative Lactic 0.8 CMP shows elevation of creatinine at 2.34 and BUN of 32, blood work 6 months ago showed creatinine of 1.76, no emergent labs right derangement, no acute elevation of LFTs CBG 110 Strep test positive  Imaging Tests: I ordered imaging studies which include:  CXR:   IMPRESSION:  Subtle added density at the RIGHT lung base may represent  atelectasis or developing infection.  I have personally reviewed patient's chest x-ray and agree with radiologist interpretation.   ED Course: A second temperature was taken at 10:20 AM, afebrile 99.1 F, this was before patient received any Tylenol.  Question whether initial temp accuracy.  Despite documented fever and mild leukocytosis patient does not appear toxic or septic on exam he is well-appearing and in no acute distress.  His creatinine is elevated from prior  however most recent creatinine was 6 months ago, this could be secondary to dehydration or also progression of patient's chronic kidney disease.  Discussed case with Dr. Regenia Skeeter, plan of care is to give patient IV fluids, start patient on doxycycline and Augmentin for coverage of both strep pharyngitis as well as  possible early pneumonia, monitor and reassess, anticipate discharge. - Discussed GFR and antibiotics with pharmacist Roderic Palau, advises that a 2 g Augmentin plus doxycycline is appropriate and we do not need to renally adjust, should cover both well. - On reevaluation patient reports he is feeling very well he is requesting discharge he does not want to stay in the hospital any longer.  He has tolerated p.o. and ambulated without difficulty.  He reports to me that he is compliant with all of his home medications including his HIV medication.  Additionally he reports that he has a follow-up visit with infectious disease, with Dr. [name] this coming Wednesday (2 days).  Feels reasonable at this time to discharge patient with continued p.o. antibiotics for treatment of strep pharyngitis and possible early pneumonia there is no indication for admission.  Patient's vital signs of her staying stable throughout ER visit, most recent temperature 98.7, he has not had any tachycardia during this visit additionally no hypoxia or hypotension.  Patient does not appear septic he appears stable for outpatient treatment.  Additionally patient without chest pain shortness of breath hypoxia tachycardia doubt ACS, PE or other emergent pathologies requiring further ED work-up at this time.  At this time there does not appear to be any evidence of an acute emergency medical condition and the patient appears stable for discharge with appropriate outpatient follow up. Diagnosis was discussed with patient who verbalizes understanding of care plan and is agreeable to discharge. I have discussed return precautions with patient who verbalizes understanding. Patient encouraged to follow-up with their PCP and ID. All questions answered.  Patient's case discussed with Dr. Regenia Skeeter who agrees with plan to discharge with 7-day course of Augmentin 2 g plus doxycycline and outpatient follow-up.   Note: Portions of this report may have  been transcribed using voice recognition software. Every effort was made to ensure accuracy; however, inadvertent computerized transcription errors may still be present. Final Clinical Impression(s) / ED Diagnoses Final diagnoses:  Strep pharyngitis  Community acquired pneumonia of right lower lobe of lung  Elevated serum creatinine    Rx / DC Orders ED Discharge Orders         Ordered    amoxicillin-clavulanate (AUGMENTIN XR) 1000-62.5 MG 12 hr tablet  2 times daily     08/23/19 1329    doxycycline (VIBRAMYCIN) 100 MG capsule  2 times daily     08/23/19 1330           Gari Crown 08/23/19 1332    Sherwood Gambler, MD 08/24/19 [id number]

## 2019-08-23 NOTE — ED Triage Notes (Signed)
Pt in w/sore throat, cough and fever (101F). States he began having sore throat after eating [employer] last night. Pain to L throat, some swelling/tenderness. Also c/o cough, new. No recent known sick contacts, states he had Covid 1 yr ago.

## 2019-08-23 NOTE — Discharge Instructions (Addendum)
At this time there does not appear to be the presence of an emergent medical condition, however there is always the potential for conditions to change. Please read and follow the below instructions.  Please return to the Emergency Department immediately for any new or worsening symptoms or if your symptoms do not improve within 3 days. Please be sure to follow up with your Primary Care Provider within one week regarding your visit today; please call their office to schedule an appointment even if you are feeling better for a follow-up visit. Please take the antibiotics Augmentin and doxycycline as prescribed for treatment of strep throat as well as early pneumonia.  Take these medications until complete. Your creatinine level was elevated today, please drink more water to avoid dehydration.  Please have your creatinine level rechecked this week.  You may have your creatinine level rechecked by your infectious disease specialist at your appointment on Wednesday.  Get help right away if: You are short of breath. You have chest pain. You cough up blood. You vomit. You have a very bad headache. Your neck hurts or feels stiff. You have drooling, very bad throat pain, or changes in your voice. Your neck is swollen, or the skin gets red and tender. Your mouth is dry, or you are peeing less than normal. You keep feeling more tired or have trouble waking up. Your joints are red or painful. You have any new/concerning or worsening of symptoms  Please read the additional information packets attached to your discharge summary.  Do not take your medicine if  develop an itchy rash, swelling in your mouth or lips, or difficulty breathing; call 911 and seek immediate emergency medical attention if this occurs.  Note: Portions of this text may have been transcribed using voice recognition software. Every effort was made to ensure accuracy; however, inadvertent computerized transcription errors may still be  present.

## 2019-08-23 NOTE — ED Notes (Signed)
Patient ate half of a cracker and drank some water with no complaint of pain. Patient was able to ambulate around the room with no problems.

## 2019-09-01 ENCOUNTER — Other Ambulatory Visit: Payer: Self-pay

## 2019-09-01 ENCOUNTER — Other Ambulatory Visit: Payer: Medicare HMO

## 2019-09-01 DIAGNOSIS — B20 Human immunodeficiency virus [HIV] disease: Secondary | ICD-10-CM

## 2019-09-01 DIAGNOSIS — R69 Illness, unspecified: Secondary | ICD-10-CM | POA: Diagnosis not present

## 2019-09-02 LAB — T-HELPER CELL (CD4) - (RCID CLINIC ONLY)
CD4 % Helper T Cell: 3 % — ABNORMAL LOW (ref 33–65)
CD4 T Cell Abs: 89 /uL — ABNORMAL LOW (ref 400–1790)

## 2019-09-03 LAB — COMPREHENSIVE METABOLIC PANEL
AG Ratio: 0.8 (calc) — ABNORMAL LOW (ref 1.0–2.5)
ALT: 20 U/L (ref 9–46)
AST: 24 U/L (ref 10–35)
Albumin: 3.5 g/dL — ABNORMAL LOW (ref 3.6–5.1)
Alkaline phosphatase (APISO): 79 U/L (ref 35–144)
BUN/Creatinine Ratio: 17 (calc) (ref 6–22)
BUN: 40 mg/dL — ABNORMAL HIGH (ref 7–25)
CO2: 19 mmol/L — ABNORMAL LOW (ref 20–32)
Calcium: 8.8 mg/dL (ref 8.6–10.3)
Chloride: 105 mmol/L (ref 98–110)
Creat: 2.39 mg/dL — ABNORMAL HIGH (ref 0.70–1.33)
Globulin: 4.5 g/dL (calc) — ABNORMAL HIGH (ref 1.9–3.7)
Glucose, Bld: 157 mg/dL — ABNORMAL HIGH (ref 65–99)
Potassium: 4.2 mmol/L (ref 3.5–5.3)
Sodium: 135 mmol/L (ref 135–146)
Total Bilirubin: 0.3 mg/dL (ref 0.2–1.2)
Total Protein: 8 g/dL (ref 6.1–8.1)

## 2019-09-03 LAB — CBC
HCT: 33 % — ABNORMAL LOW (ref 38.5–50.0)
Hemoglobin: 10.5 g/dL — ABNORMAL LOW (ref 13.2–17.1)
MCH: 25.9 pg — ABNORMAL LOW (ref 27.0–33.0)
MCHC: 31.8 g/dL — ABNORMAL LOW (ref 32.0–36.0)
MCV: 81.5 fL (ref 80.0–100.0)
MPV: 10.4 fL (ref 7.5–12.5)
Platelets: 269 10*3/uL (ref 140–400)
RBC: 4.05 10*6/uL — ABNORMAL LOW (ref 4.20–5.80)
RDW: 13.5 % (ref 11.0–15.0)
WBC: 6.8 10*3/uL (ref 3.8–10.8)

## 2019-09-03 LAB — HIV-1 RNA QUANT-NO REFLEX-BLD
HIV 1 RNA Quant: 7280 copies/mL — ABNORMAL HIGH
HIV-1 RNA Quant, Log: 3.86 Log copies/mL — ABNORMAL HIGH

## 2019-09-07 IMAGING — CR CHEST - 2 VIEW
2 series · 2 of 2 positions shown · non-contrast
Comparison: Chest x-ray 01/26/2018

CLINICAL DATA: Hyperglycemia.  Weakness.

EXAM:
CHEST - 2 VIEW

[chest lat]
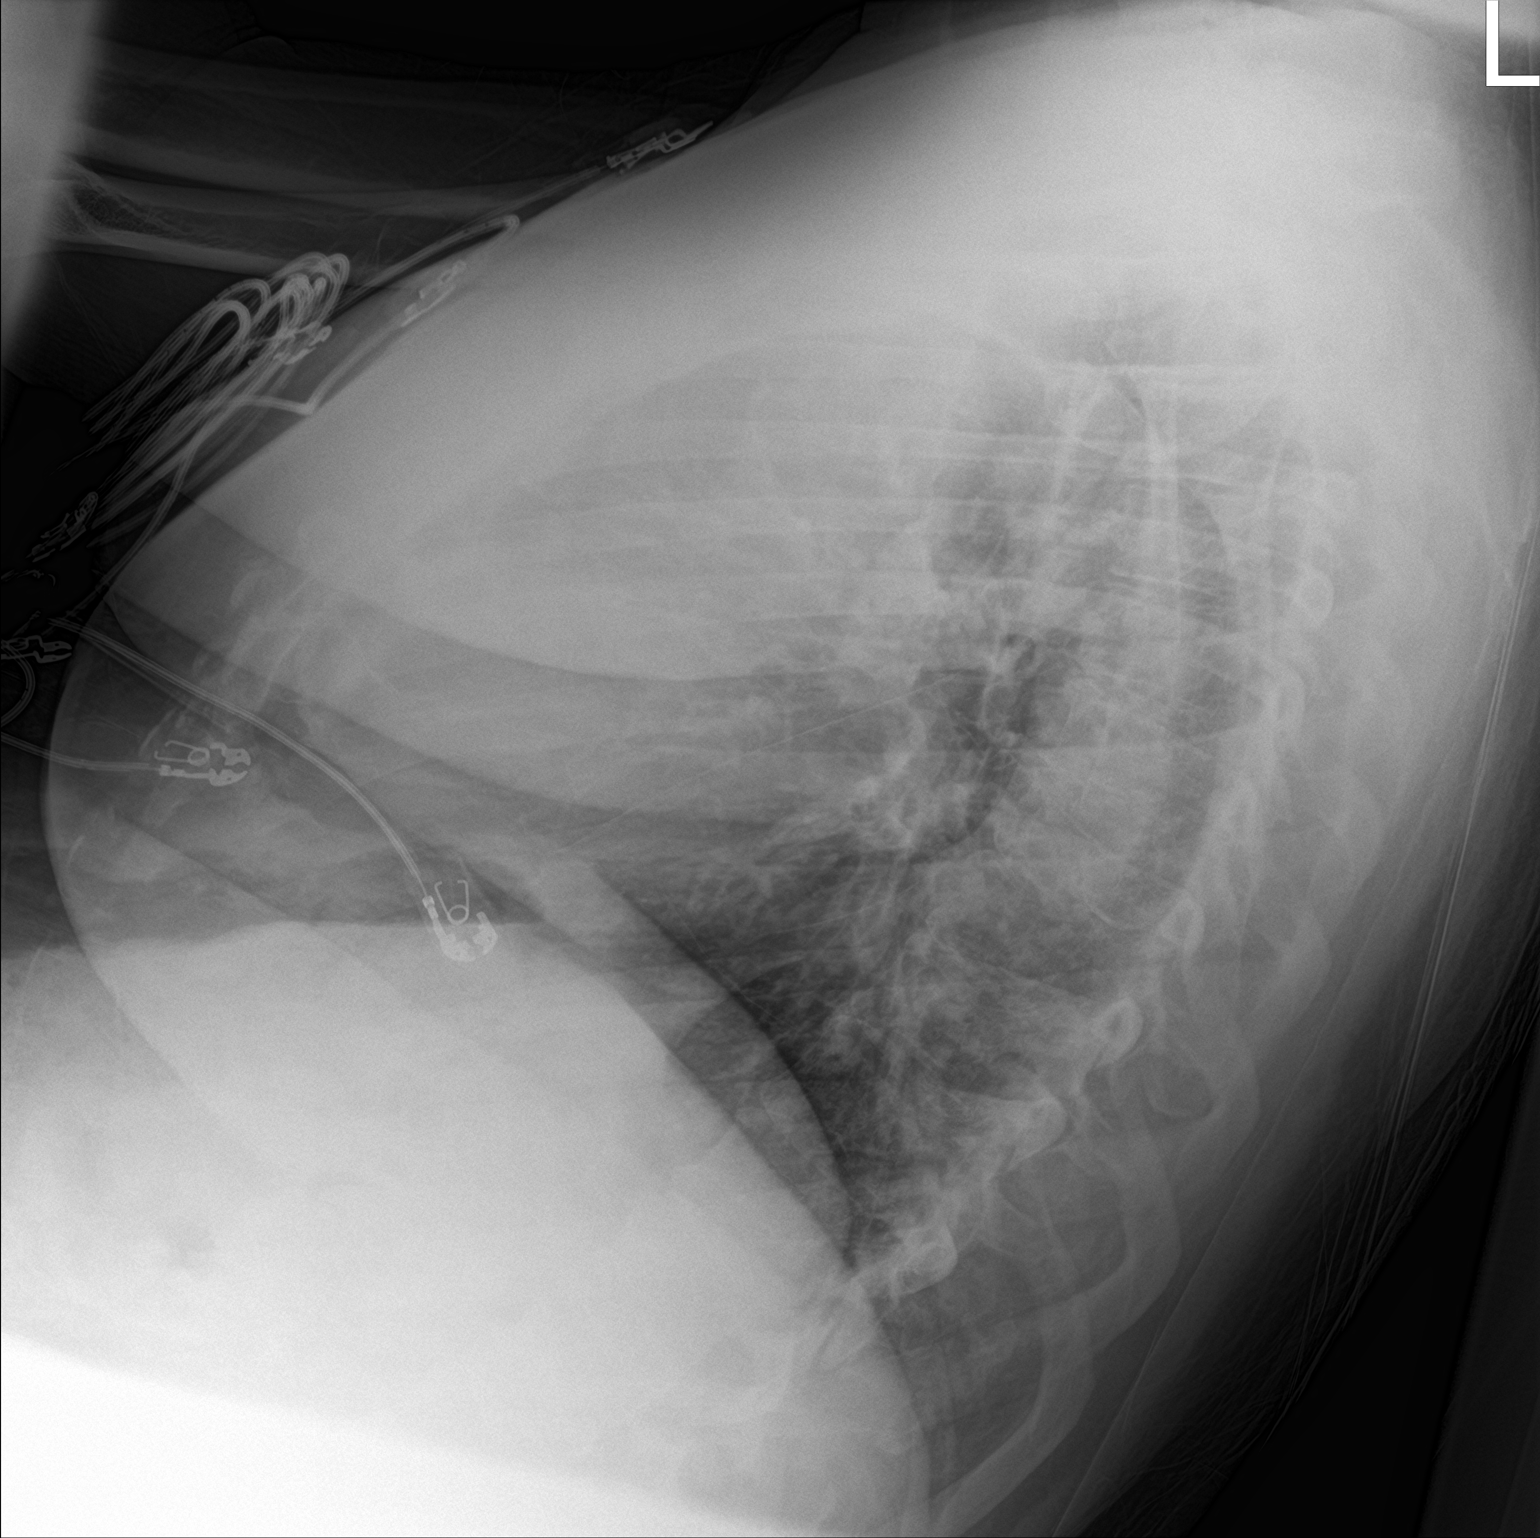

[chest ap]
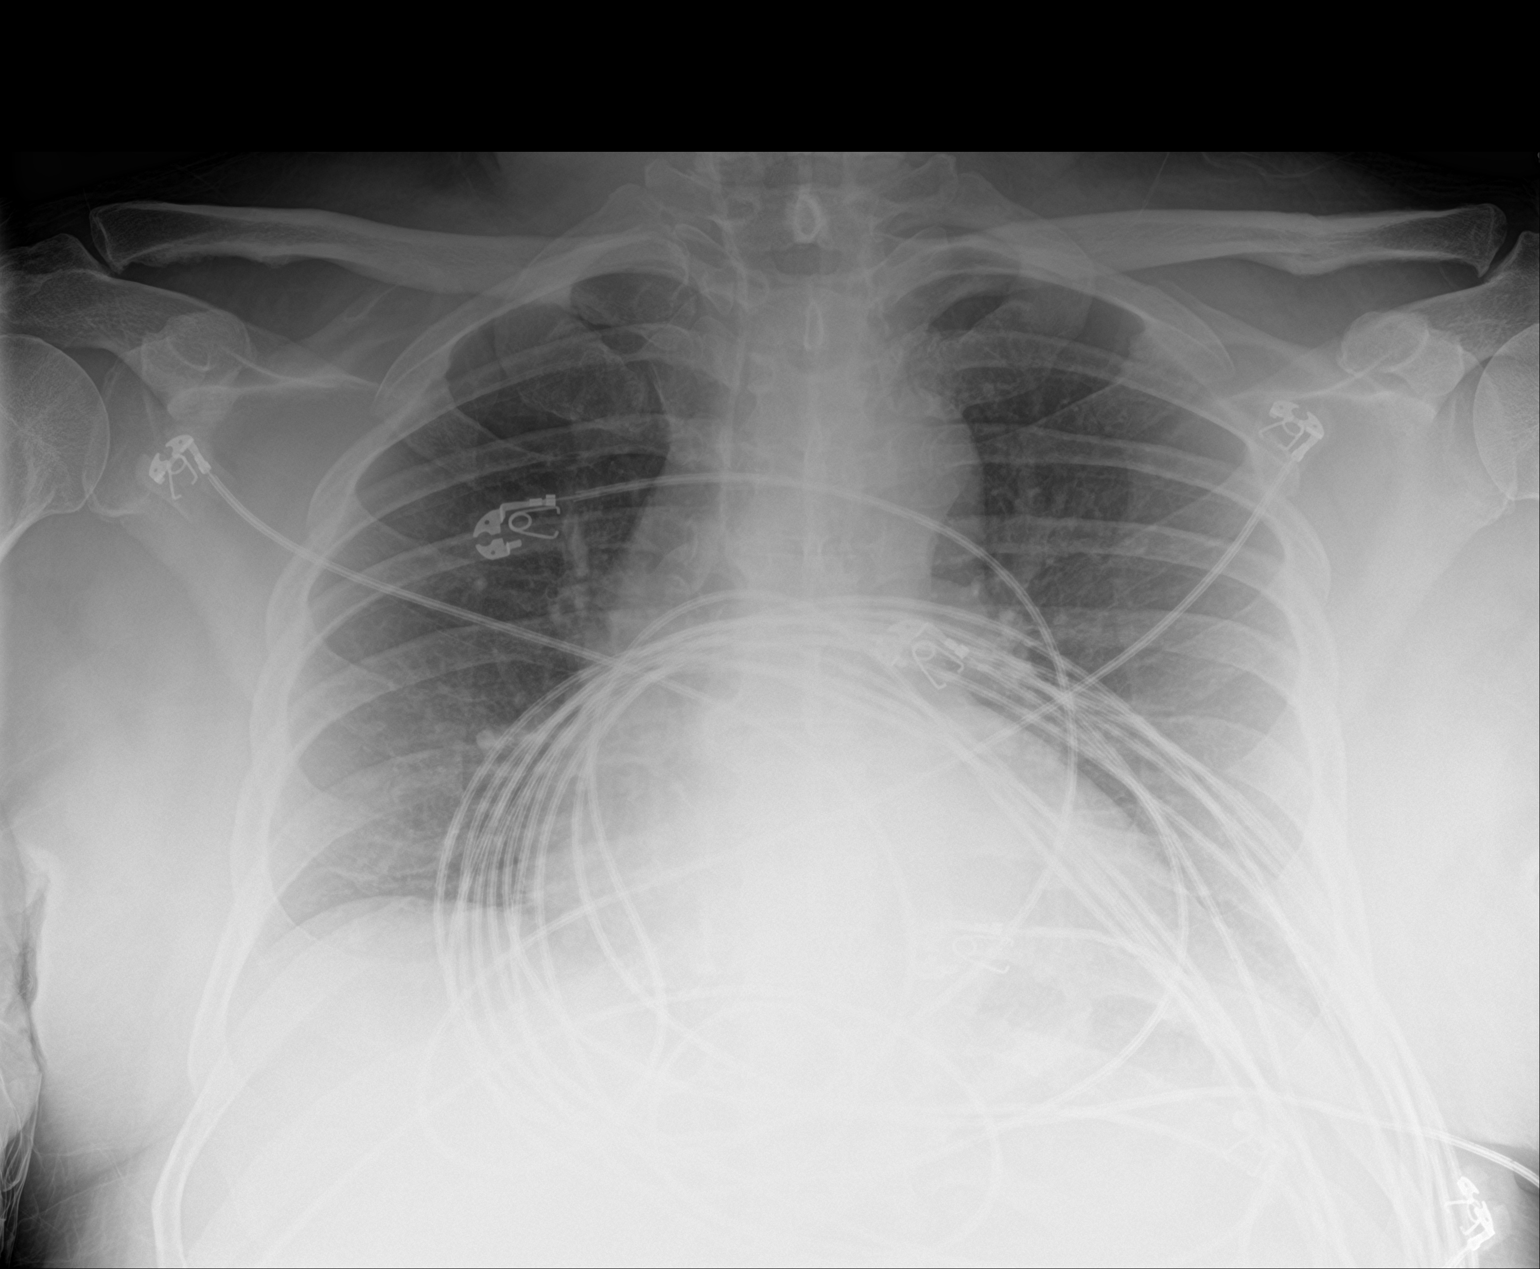

[2 of 2 positions shown; findings below may reference images not displayed]

FINDINGS: Examination is significantly limited by large amount of artifact
from the EKG wires which are right in the middle of the chest.

The heart is mildly enlarged but stable. The mediastinal and hilar
contours are grossly normal and stable.

No gross acute pulmonary process but examination limited. No large
pleural effusions or pneumothorax.
IMPRESSION: 1. Very limited chest x-ray.
2. No obvious/gross acute cardiopulmonary findings.

## 2019-09-07 IMAGING — CT CT HEAD WITHOUT CONTRAST
4 series · 17 of 47 positions shown, 19 images · non-contrast
Comparison: Head CT 08/06/2016

CLINICAL DATA: Weakness. Fatigue and malaise.

EXAM:
CT HEAD WITHOUT CONTRAST
TECHNIQUE: Contiguous axial images were obtained from the base of the skull
through the vertex without intravenous contrast.

[Series 3: head wo · axial · 0.44mm/px · z∈[-177,-32]mm · 7 of 39 slices shown, 9 images]
[im 5/39  brain]
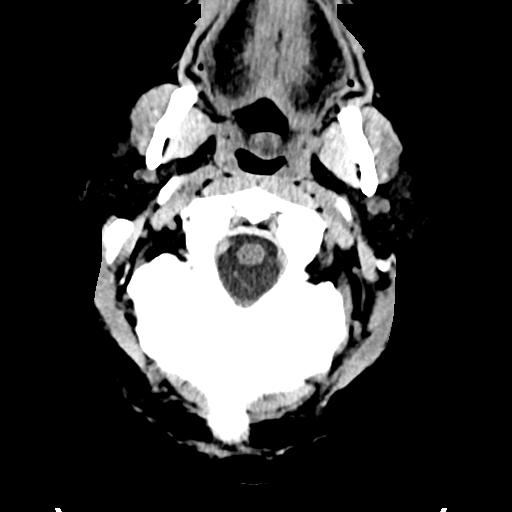
[im 5/39  bone]
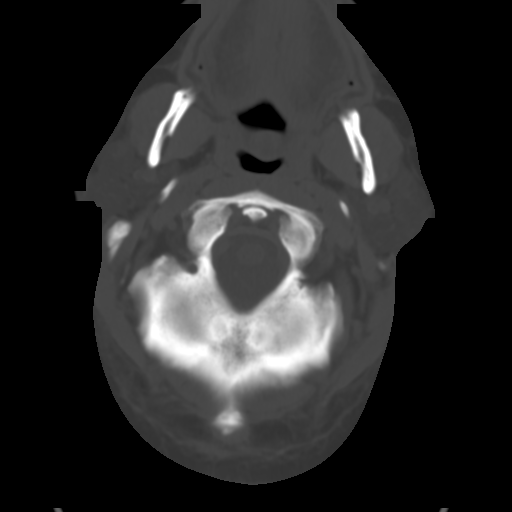
[im 10/39  brain]
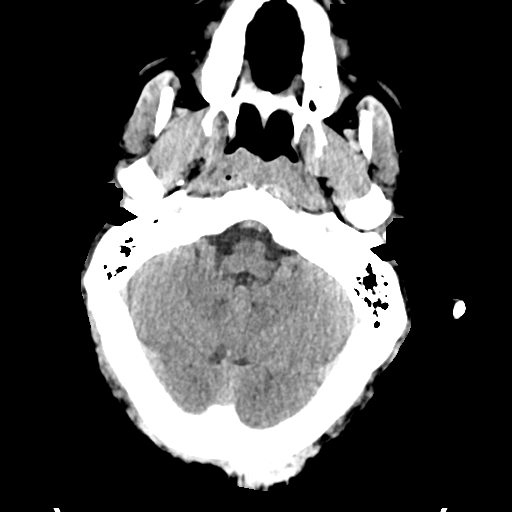
[im 15/39  brain]
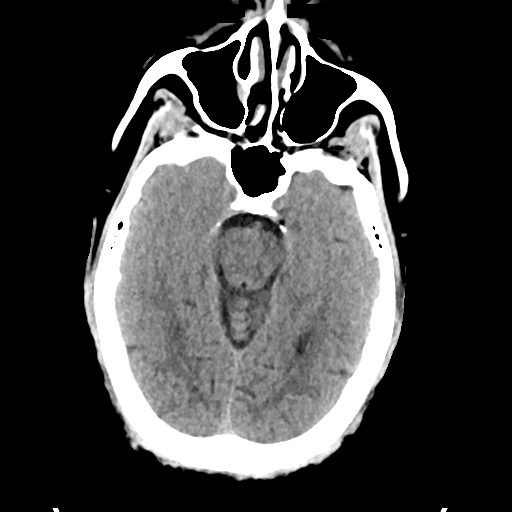
[im 20/39  brain]
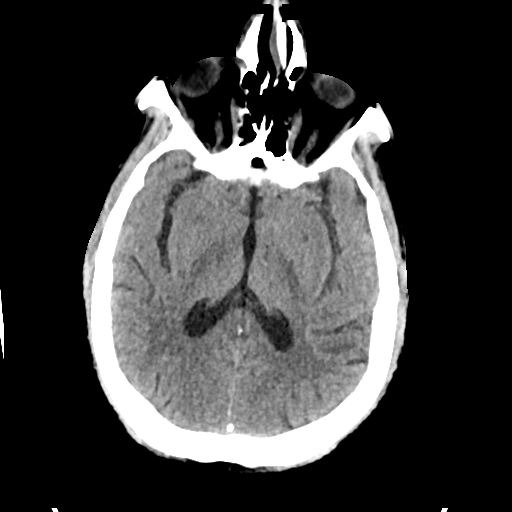
[im 24/39  brain]
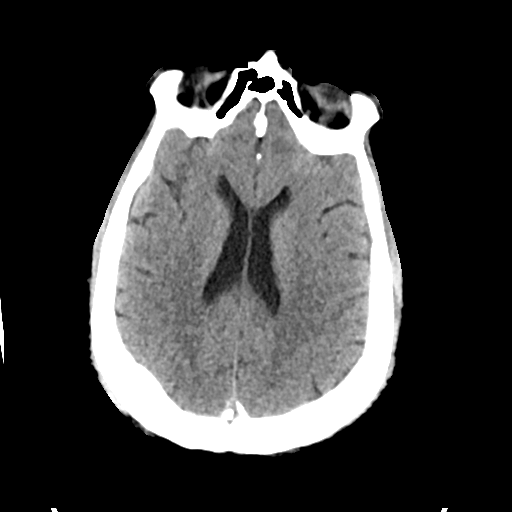
[im 24/39  bone]
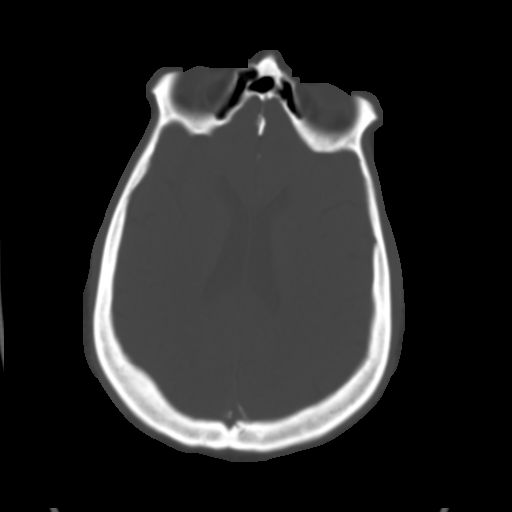
[im 29/39  brain]
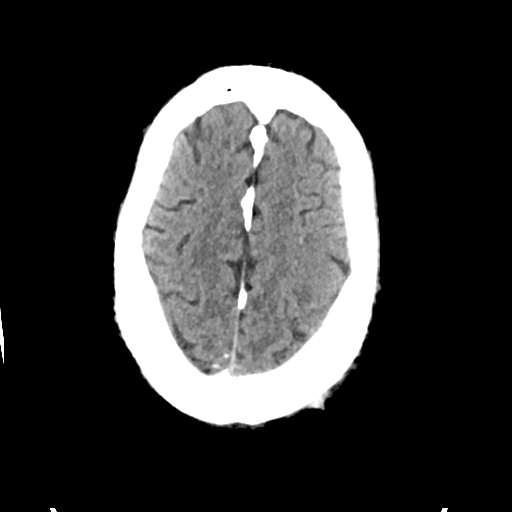
[im 34/39  brain]
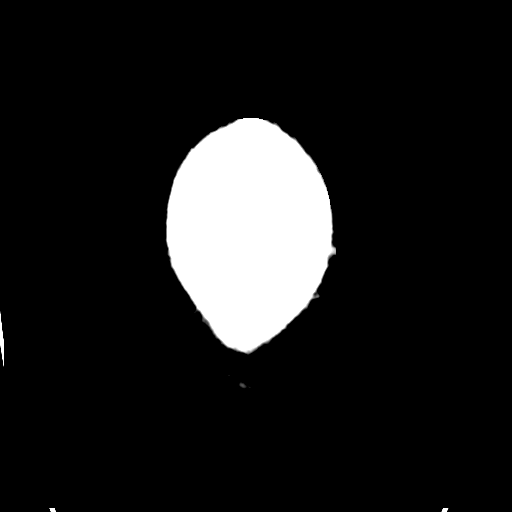

[Series 4: head bone · axial · 0.44mm/px · z∈[-179,-111]mm · 4 of 97 slices shown]
[im 10/97  bone]
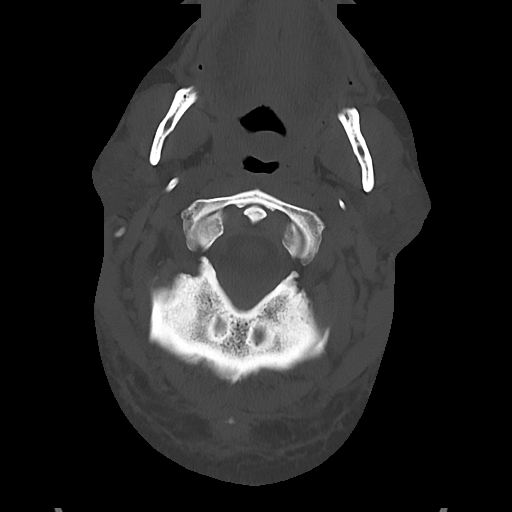
[im 20/97  bone]
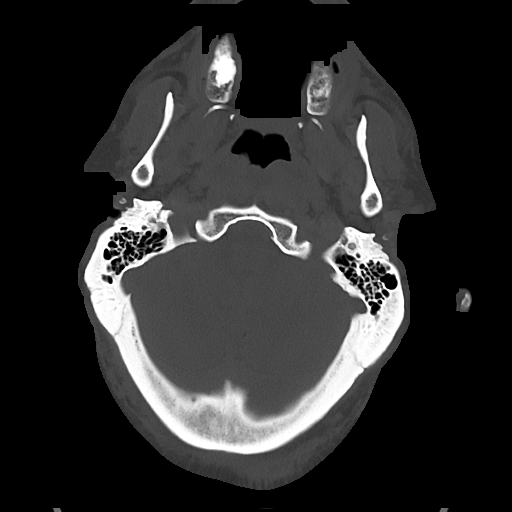
[im 29/97  bone]
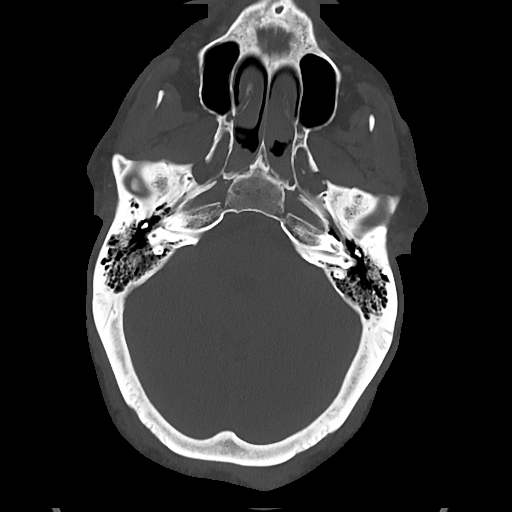
[im 44/97  bone]
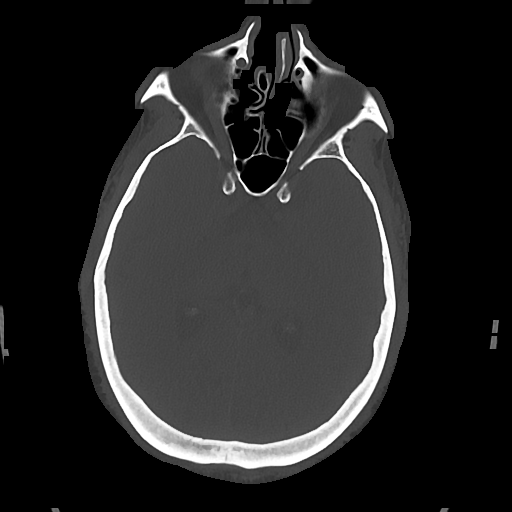

[Series 5: cor soft · coronal · 0.38mm/px · 3 of 79 slices shown]
[im 27/79  brain]
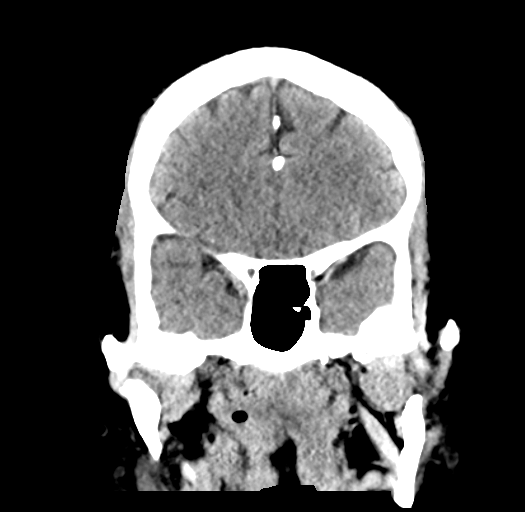
[im 35/79  brain]
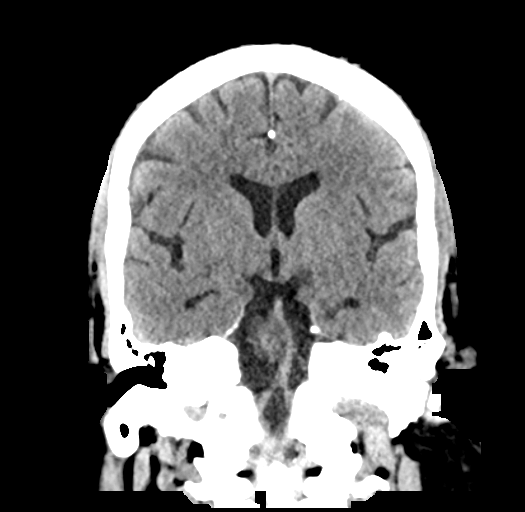
[im 44/79  brain]
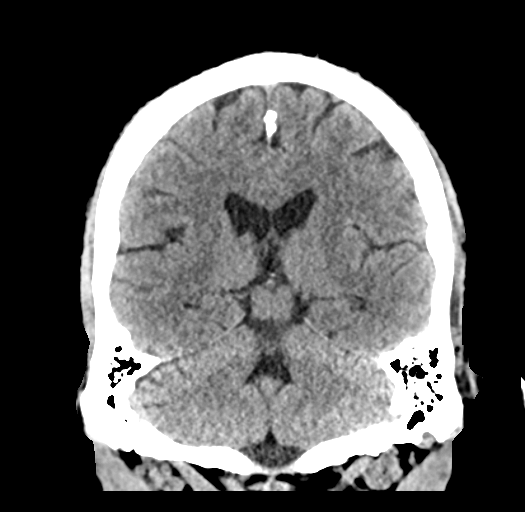

[Series 6: sag soft · sagittal · 0.38mm/px · 3 of 67 slices shown]
[im 23/67  brain]
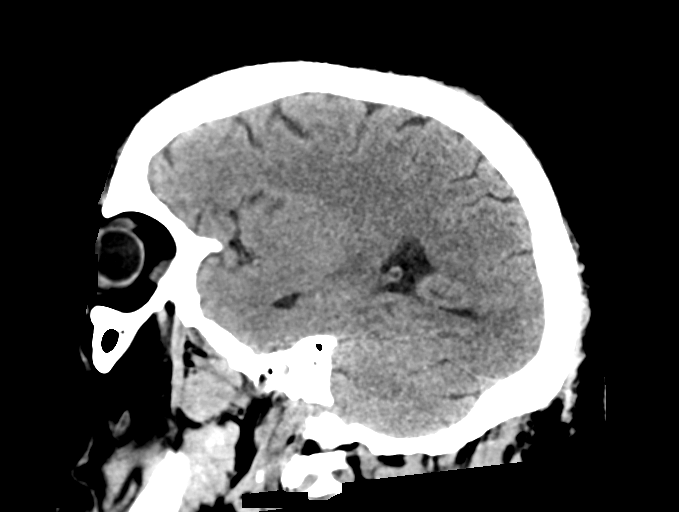
[im 34/67  brain]
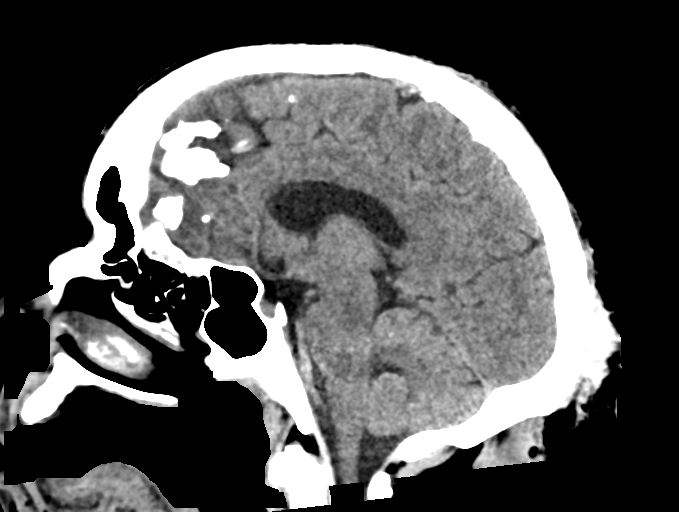
[im 45/67  brain]
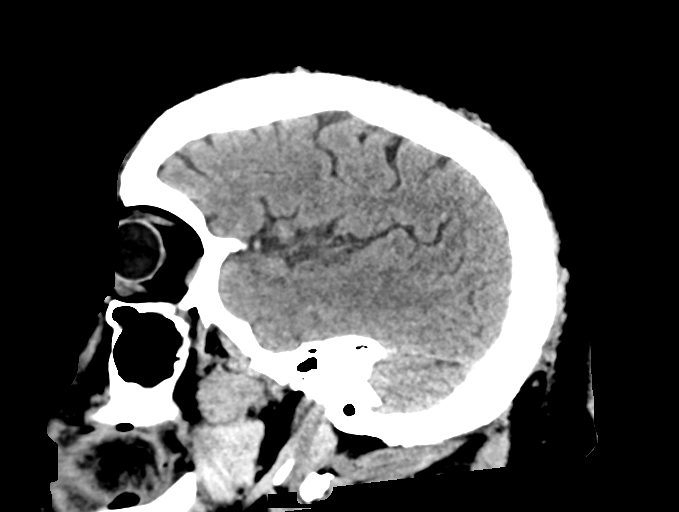

[17 of 47 positions shown; findings below may reference images not displayed]

FINDINGS: Brain: No intracranial hemorrhage, mass effect, or midline shift. No
hydrocephalus. The basilar cisterns are patent. No evidence of
territorial infarct or acute ischemia. No extra-axial or
intracranial fluid collection.

Vascular: No hyperdense vessel.

Skull: No fracture or focal lesion.

Sinuses/Orbits: No acute findings. Remote left medial orbital wall
fracture. Bilateral lens extraction.

Other: None.
IMPRESSION: No acute intracranial abnormality.

## 2019-09-17 IMAGING — US US RENAL
1 series · 14 of 25 positions shown · non-contrast
Comparison: 09/28/2016.

CLINICAL DATA: Acute superimposed upon chronic kidney disease.

EXAM:
RENAL / URINARY TRACT ULTRASOUND COMPLETE

[Series 1: us renal · 0.25mm/px · 14 of 35 slices shown]
[im 1/35]
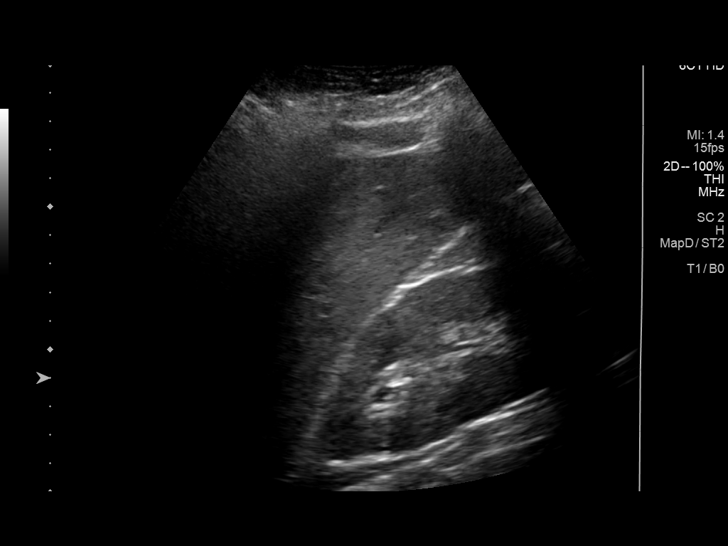
[im 3/35]
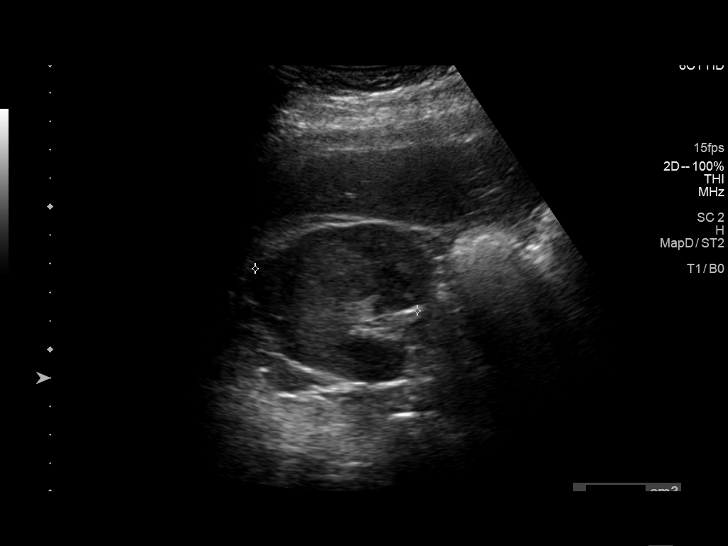
[im 6/35]
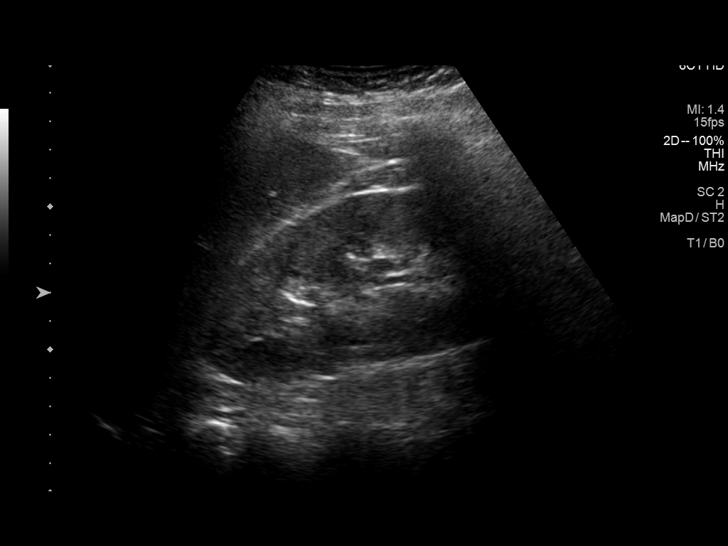
[im 9/35]
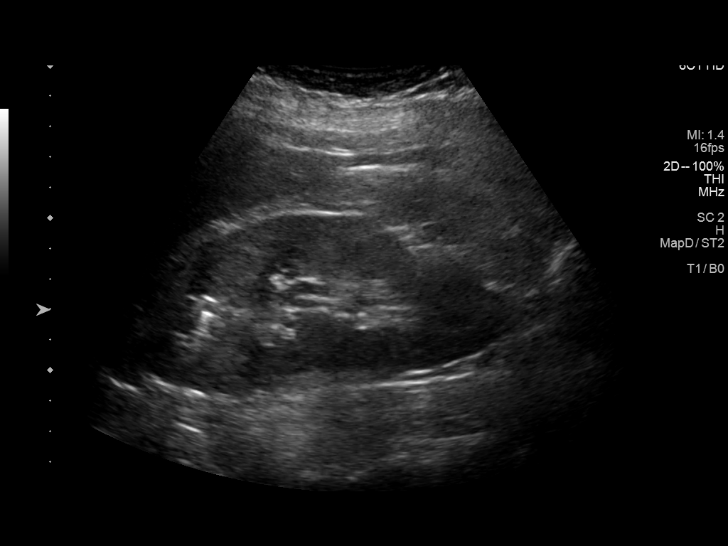
[im 12/35]
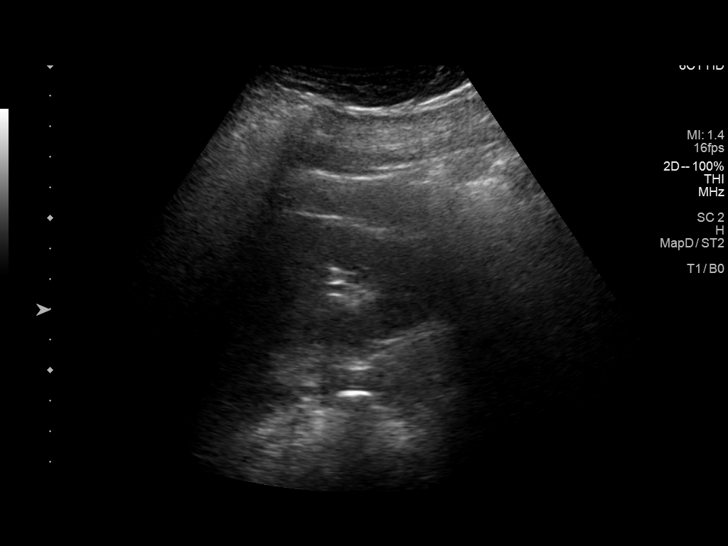
[im 13/35]
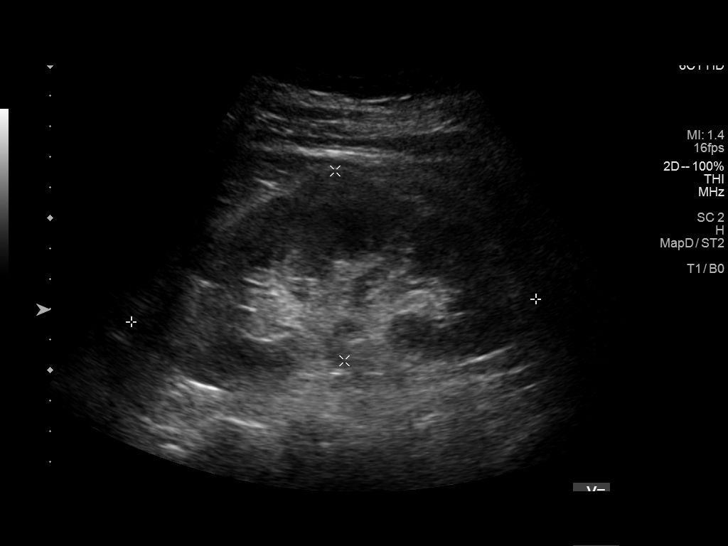
[im 16/35]
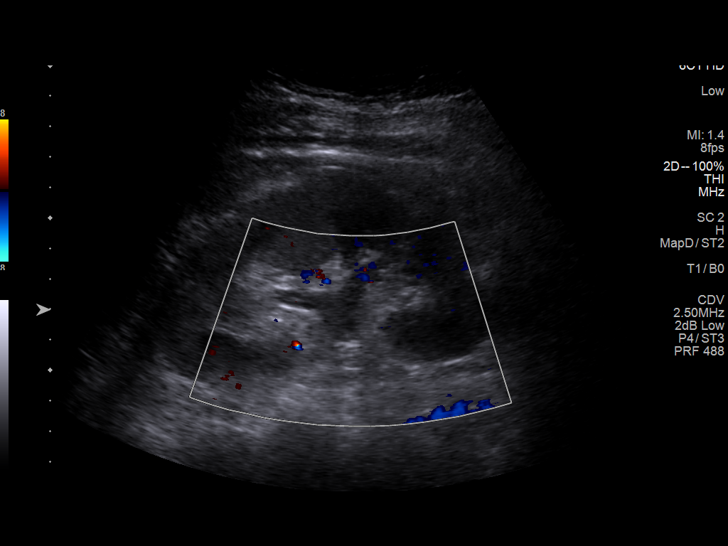
[im 19/35]
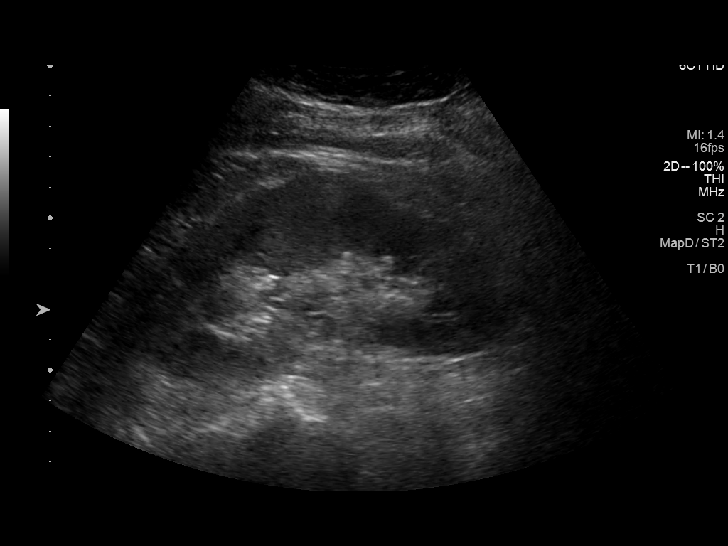
[im 22/35]
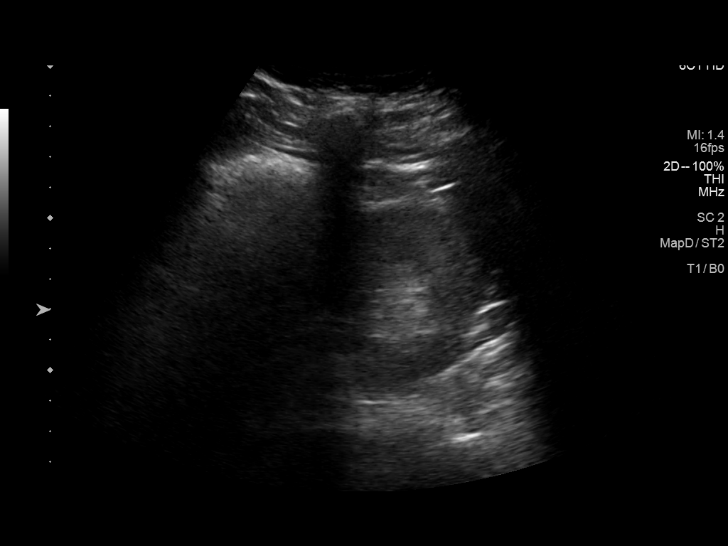
[im 23/35]
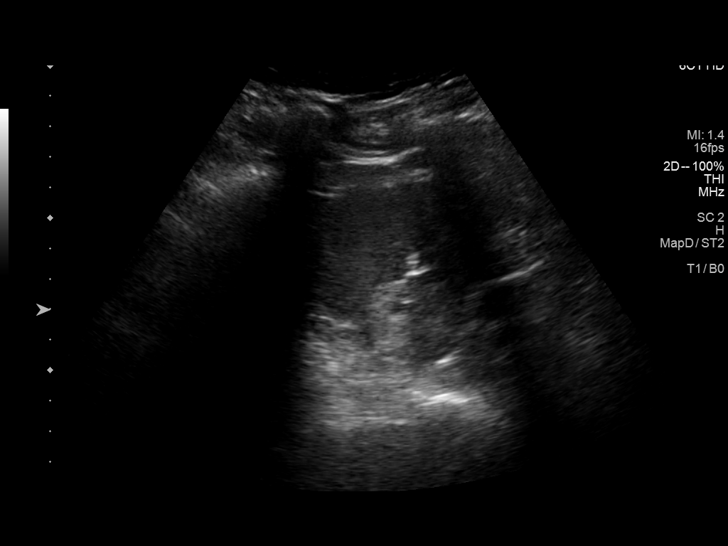
[im 26/35]
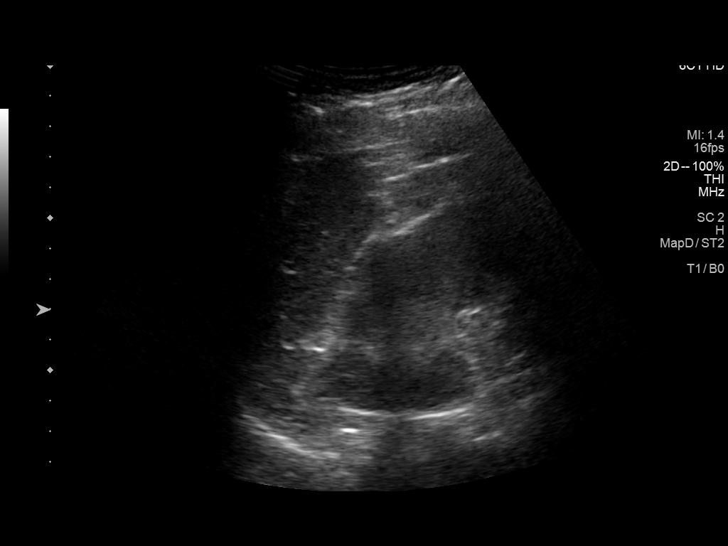
[im 29/35]
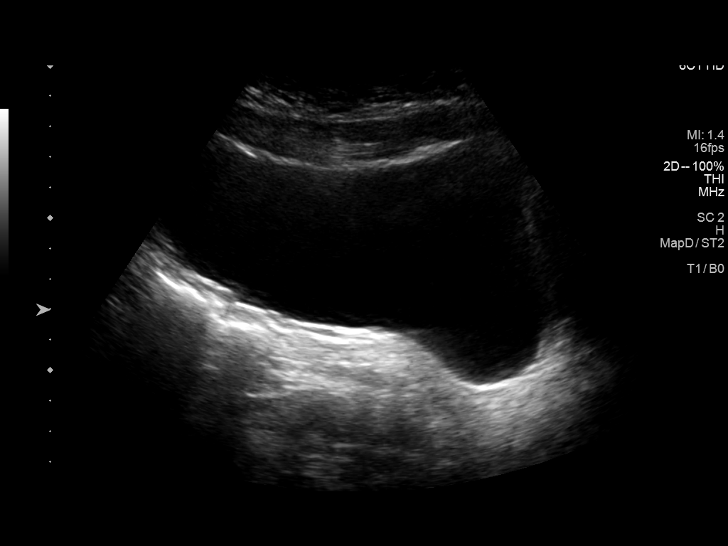
[im 32/35]
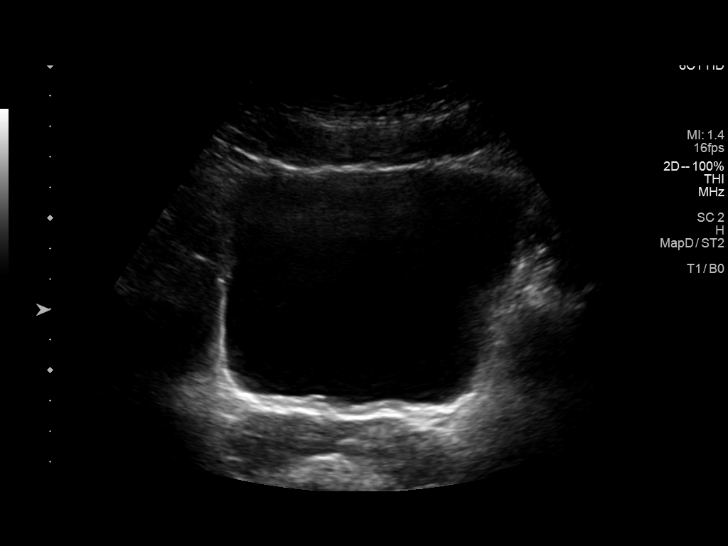
[im 35/35]
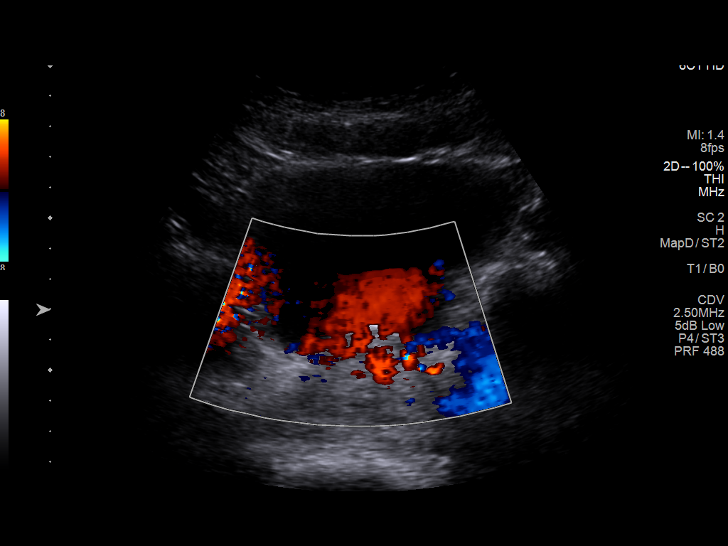

[14 of 25 positions shown; findings below may reference images not displayed]

FINDINGS: Right Kidney:

Renal measurements: Approximately 13.6 x 5.2 x 5.9 cm = volume: 218
mL . No hydronephrosis. Well-preserved cortex. No shadowing calculi.
Borderline to mild increased parenchymal echotexture. No focal
parenchymal abnormality.

Left Kidney:

Renal measurements: Approximately 13.3 x 6.2 x 6.0 cm = volume: 259
mL. No hydronephrosis. Well-preserved cortex. No shadowing calculi.
Borderline to mild increased parenchymal echotexture. No focal
parenchymal abnormality.

Bladder:

Normal for degree of bladder distension. BILATERAL ureteral jets
visualized at color Doppler evaluation.

No interval change since the prior ultrasound in September 2016.
IMPRESSION: 1. Borderline mild increased parenchymal echotexture involving both
kidneys as can be seen in patients with medical renal disease.
2. Otherwise normal examination and no interval change since [DATE],

## 2019-09-21 ENCOUNTER — Encounter: Payer: Medicare HMO | Admitting: Infectious Diseases

## 2019-09-21 IMAGING — DX LEFT MIDDLE FINGER 2+V
3 series · 3 of 3 positions shown · non-contrast
Comparison: None.

CLINICAL DATA: Left middle finger wound.

EXAM:
LEFT MIDDLE FINGER 2+V

[finger ap]
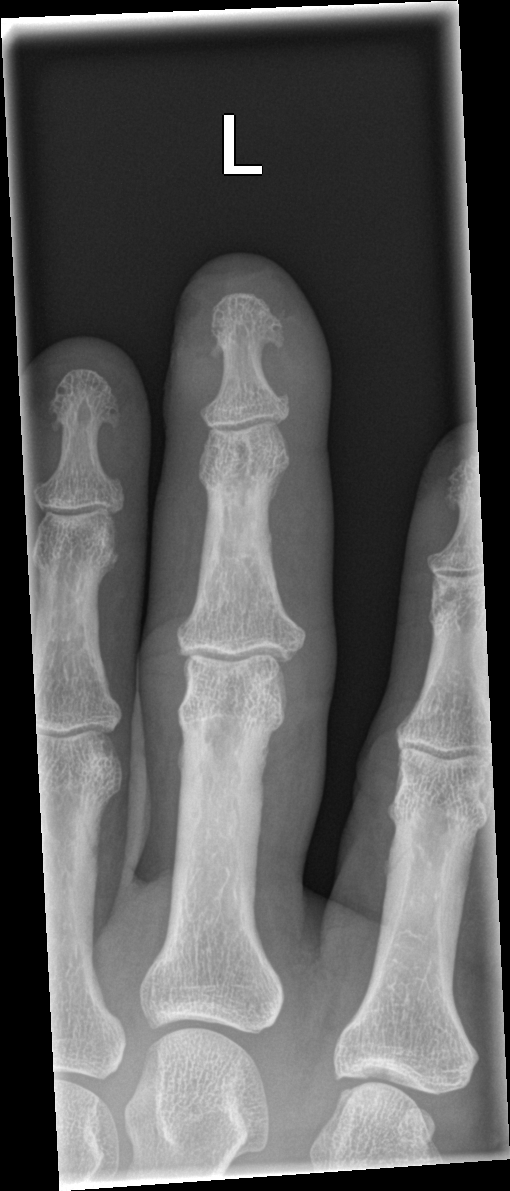

[finger obl]
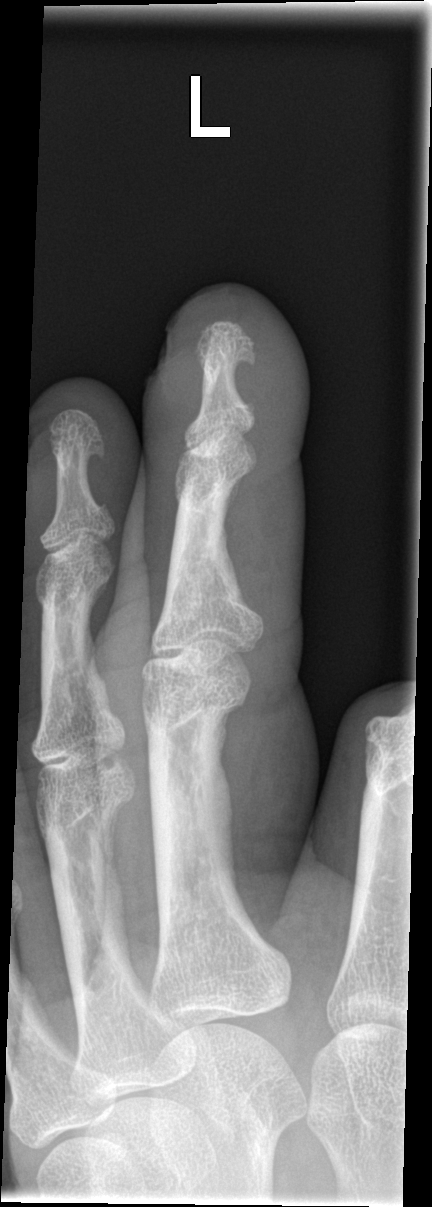

[finger lat]
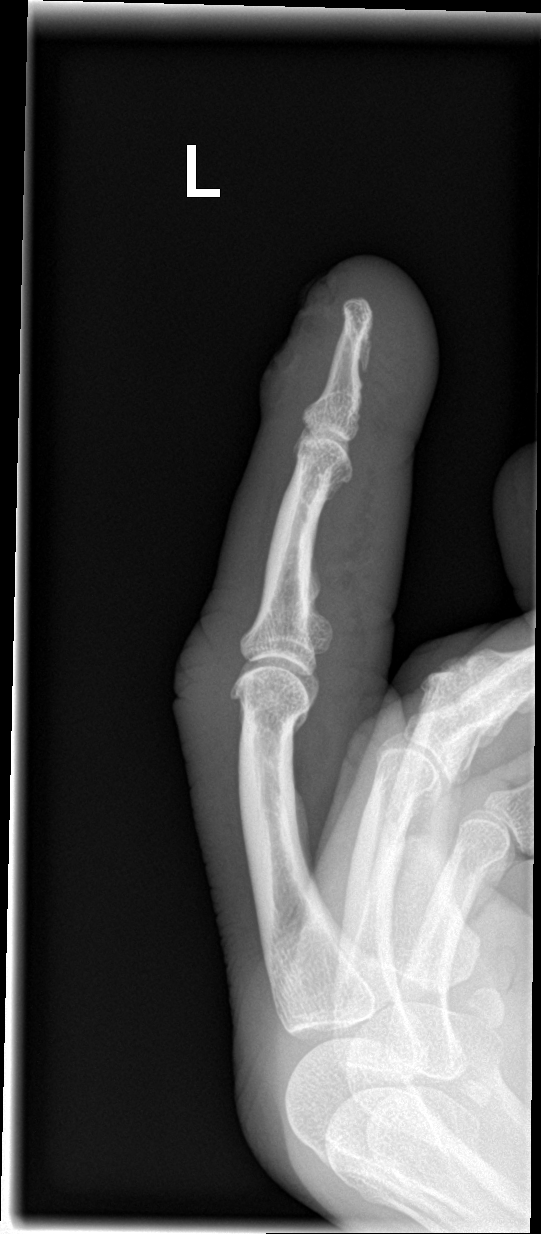

[3 of 3 positions shown; findings below may reference images not displayed]

FINDINGS: There is no evidence of fracture or dislocation. There is no
evidence of arthropathy or other focal bone abnormality. No lytic
destruction is noted. Diffuse soft tissue swelling is noted
concerning for infection.
IMPRESSION: No fracture or dislocation is noted. Diffuse soft tissue swelling of
the left middle finger is noted concerning for infection.

## 2019-09-21 IMAGING — DX PORTABLE CHEST - 1 VIEW
1 series · 1 of 1 positions shown · non-contrast
Comparison: Chest x-rays dated 10/11/2018 and 01/26/2018.

CLINICAL DATA: Headache and cough for 2 weeks.

EXAM:
PORTABLE CHEST 1 VIEW

[chest ap]
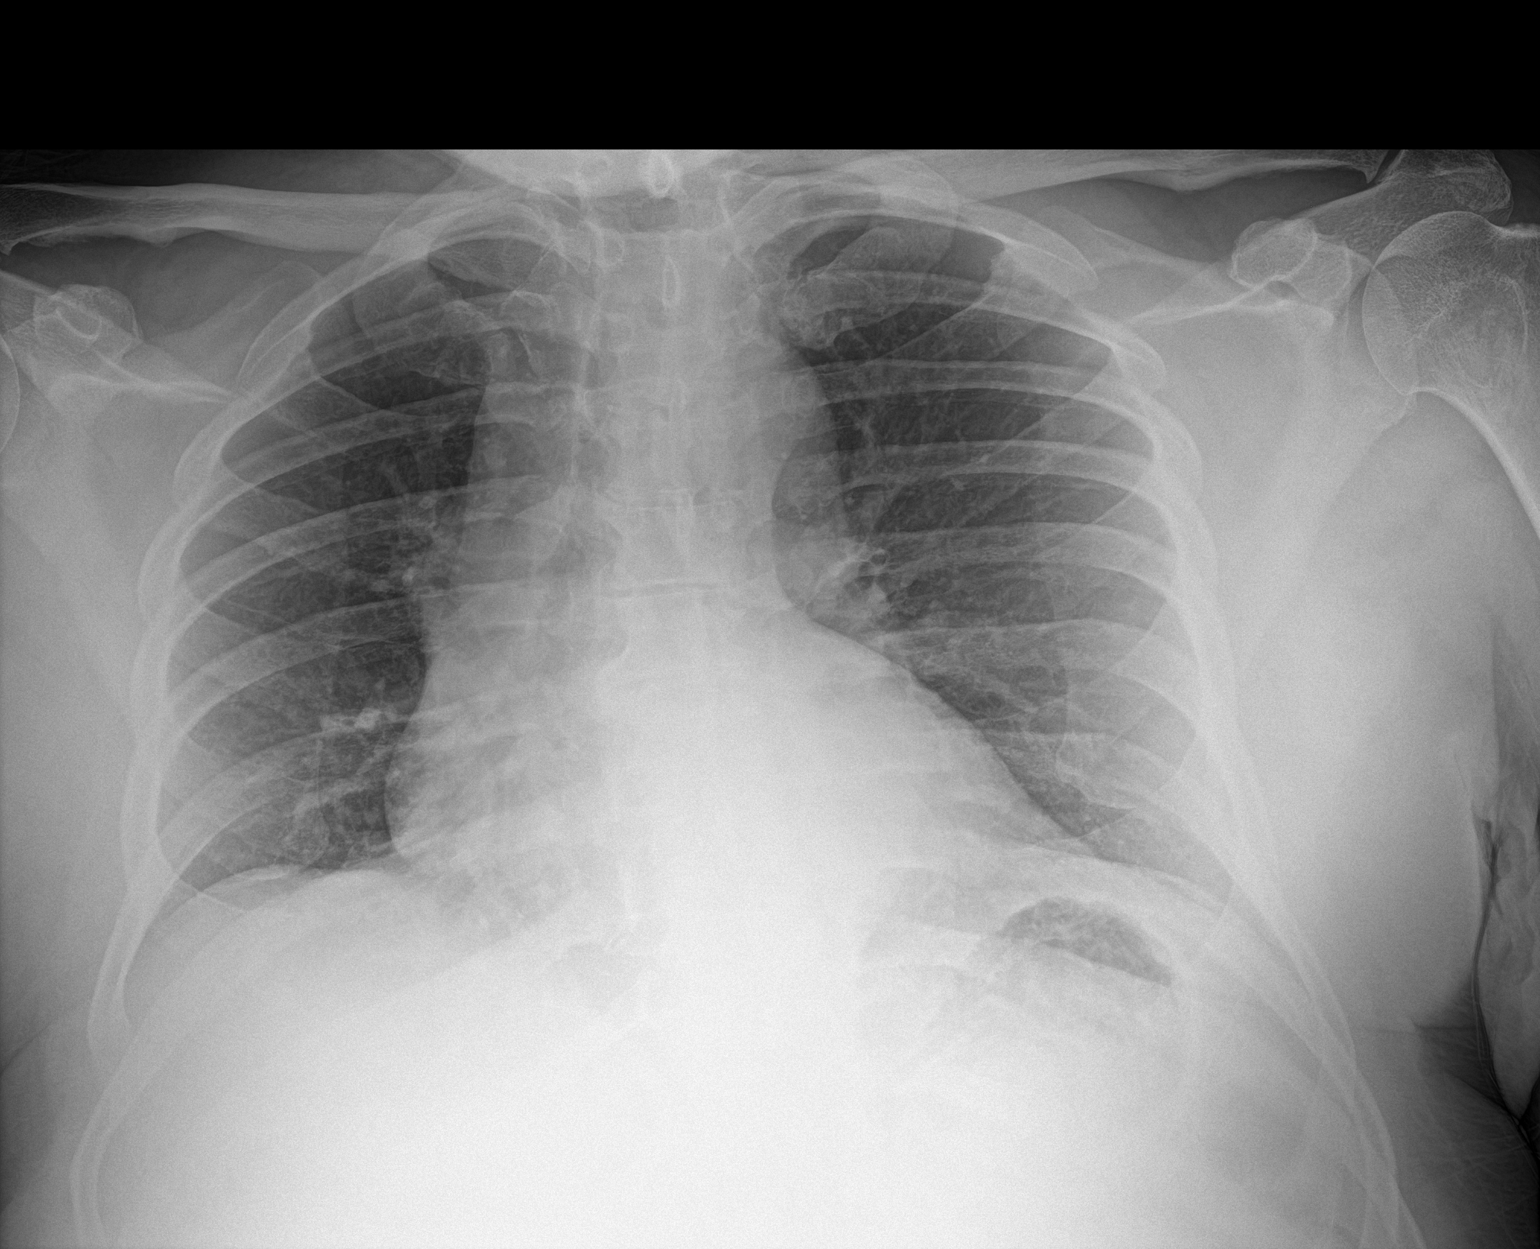

[1 of 1 positions shown; findings below may reference images not displayed]

FINDINGS: Heart size is upper normal, stable. Overall cardiomediastinal
silhouette is stable. Lungs are clear. No pleural effusion or
pneumothorax seen. Osseous structures about the chest are
unremarkable.
IMPRESSION: No active disease. No evidence of pneumonia or pulmonary edema.

## 2019-09-23 DIAGNOSIS — Z79899 Other long term (current) drug therapy: Secondary | ICD-10-CM | POA: Diagnosis not present

## 2019-09-23 DIAGNOSIS — M545 Low back pain: Secondary | ICD-10-CM | POA: Diagnosis not present

## 2019-09-23 DIAGNOSIS — E559 Vitamin D deficiency, unspecified: Secondary | ICD-10-CM | POA: Diagnosis not present

## 2019-09-23 DIAGNOSIS — G546 Phantom limb syndrome with pain: Secondary | ICD-10-CM | POA: Diagnosis not present

## 2019-09-24 IMAGING — DX PORTABLE CHEST - 1 VIEW
1 series · 1 of 1 positions shown · non-contrast
Comparison: 10/25/2018

CLINICAL DATA: Cough

EXAM:
PORTABLE CHEST 1 VIEW

[chest ap]
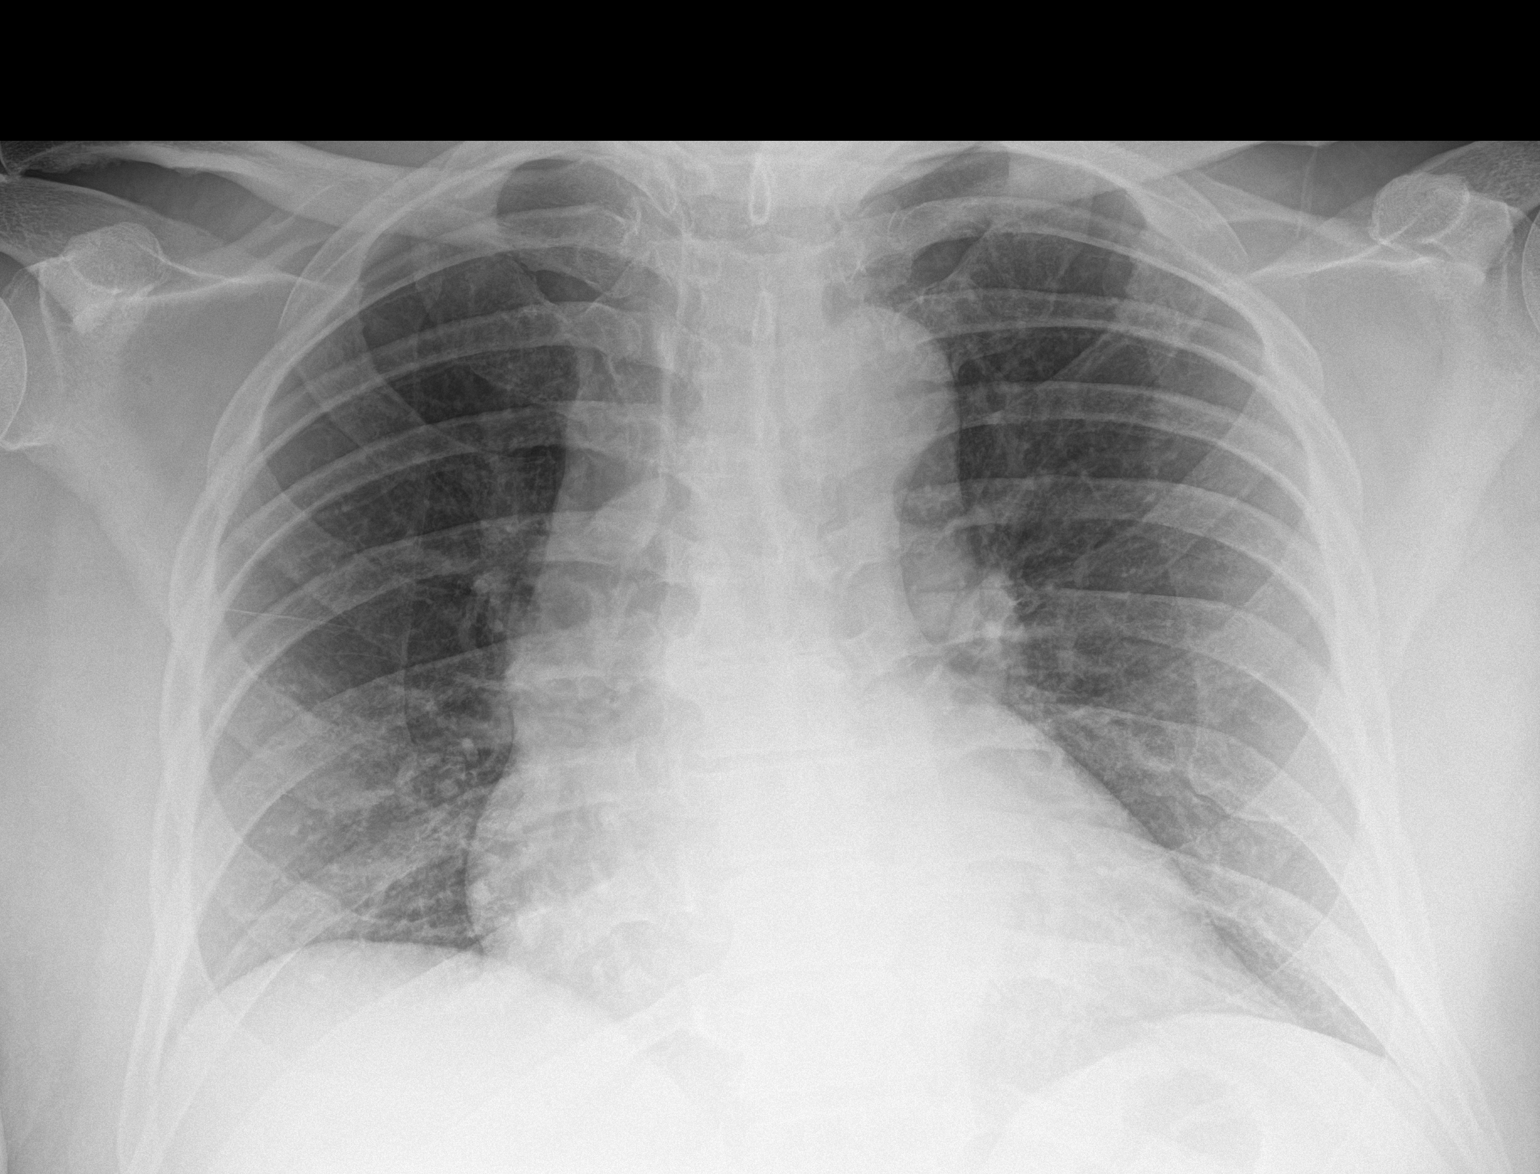

[1 of 1 positions shown; findings below may reference images not displayed]

FINDINGS: Study is an exception folder currently.

There is no edema, consolidation, effusion, or pneumothorax. Normal
heart size and stable aortic tortuosity.
IMPRESSION: Stable exam.  No acute finding.

## 2019-10-05 ENCOUNTER — Ambulatory Visit (INDEPENDENT_AMBULATORY_CARE_PROVIDER_SITE_OTHER): Payer: Medicare HMO | Admitting: Infectious Diseases

## 2019-10-05 ENCOUNTER — Other Ambulatory Visit: Payer: Self-pay

## 2019-10-05 ENCOUNTER — Encounter: Payer: Self-pay | Admitting: Infectious Diseases

## 2019-10-05 VITALS — BP 144/83 | HR 80 | Ht 70.0 in | Wt 282.0 lb

## 2019-10-05 DIAGNOSIS — Z79899 Other long term (current) drug therapy: Secondary | ICD-10-CM | POA: Diagnosis not present

## 2019-10-05 DIAGNOSIS — B20 Human immunodeficiency virus [HIV] disease: Secondary | ICD-10-CM

## 2019-10-05 DIAGNOSIS — E1142 Type 2 diabetes mellitus with diabetic polyneuropathy: Secondary | ICD-10-CM | POA: Diagnosis not present

## 2019-10-05 DIAGNOSIS — N182 Chronic kidney disease, stage 2 (mild): Secondary | ICD-10-CM | POA: Diagnosis not present

## 2019-10-05 DIAGNOSIS — Z113 Encounter for screening for infections with a predominantly sexual mode of transmission: Secondary | ICD-10-CM | POA: Diagnosis not present

## 2019-10-05 DIAGNOSIS — I214 Non-ST elevation (NSTEMI) myocardial infarction: Secondary | ICD-10-CM

## 2019-10-05 DIAGNOSIS — R69 Illness, unspecified: Secondary | ICD-10-CM | POA: Diagnosis not present

## 2019-10-05 NOTE — Assessment & Plan Note (Signed)
He needs repeat blood draw but wants to defer til he has more medication in his system.  Has condoms Has not gotten COVID vax (scared).  PCV 23 at 54 yo.  Says he had colon at 54yo rtc in 6 months rtc in 2 weeks for labs.

## 2019-10-05 NOTE — Assessment & Plan Note (Signed)
Has had f/u with renal.  relatively stable over last 2 blood draws.

## 2019-10-05 NOTE — Progress Notes (Signed)
   Subjective:    [name], male    DOB: 16-Jan-1966, 54 y.o.   MRN: [id number]  HPI 54 y.o.MHIV+, uncontrolledDM2, HTN, stage III CKD, and chronic right foot osteomyelitis who presented to the ED 1/21/19with worsening distal plantar pain and fever/chills concerning for worsening wound infection/osteomyelitis despite outpatient augmentin. He hadright trans-met amputation1/25/2019. Blood cultures grew MRSA PICC line was placed 1/29 andhe completedvancomycin to2/7.  On 06-13-17 he underwent trans-tibial amputation after developing an abscess and wound dehisc in his R foot.  He was in hospital 08-07-2018 with HHS and AKI (CBG 395 and Anion Gap 11).  He is currently on prezcobix and triumeq (he had genotype 05-2018 showing background mutations, geno 11-2009 showing no mutations, 2010 M184V).   He was switched to biktarvy at his prev visit.  Since he has been in hospital with acute MI (11-2018), infection of R BKA stump (10-2018).   He has been off ART as his father died.  He has since restarted.  He states his sugars have been fine. He is awaiting on a new leg.  Denies CP or SOB.    HIV 1 RNA Quant (copies/mL)  Date Value  09/01/2019 7,280 (H)  09/17/2017 <20 NOT DETECTED  01/14/2017 <20   CD4 T Cell Abs (/uL)  Date Value  09/01/2019 89 (L)  06/06/2018 140 (L)  04/28/2017 80 (L)    Review of Systems  Constitutional: Negative for appetite change, chills, fever and unexpected weight change.  Respiratory: Negative for shortness of breath.   Cardiovascular: Negative for chest pain.  Gastrointestinal: Negative for constipation and diarrhea.  Genitourinary: Negative for difficulty urinating.  Neurological: Positive for numbness (phantom pain. ).       Objective:   Physical Exam Vitals reviewed.  HENT:     Mouth/Throat:     Mouth: Mucous membranes are moist.     Pharynx: No oropharyngeal exudate.  Eyes:     Extraocular Movements: Extraocular movements  intact.     Pupils: Pupils are equal, round, and reactive to light.  Cardiovascular:     Rate and Rhythm: Normal rate.  Pulmonary:     Effort: Pulmonary effort is normal.     Breath sounds: Normal breath sounds.  Abdominal:     General: Bowel sounds are normal. There is no distension.     Palpations: Abdomen is soft.     Tenderness: There is no abdominal tenderness.  Musculoskeletal:     Cervical back: Normal range of motion and neck supple.     Left lower leg: Edema present.  Psychiatric:        Mood and Affect: Mood normal.           Assessment & Plan:

## 2019-10-05 NOTE — Assessment & Plan Note (Signed)
He is to have f/u for new prothesis.  Needs better control.  Will check A1C at next visit.

## 2019-10-05 NOTE — Assessment & Plan Note (Signed)
Appears to be doing well.  appreciate his CV f/u

## 2019-10-07 IMAGING — DX CHEST - 2 VIEW
2 series · 2 of 2 positions shown · non-contrast
Comparison: 10/28/2018 portable chest and earlier.

CLINICAL DATA: 52-year-old male with chest pain since yesterday.

EXAM:
CHEST - 2 VIEW

[w chest pa]
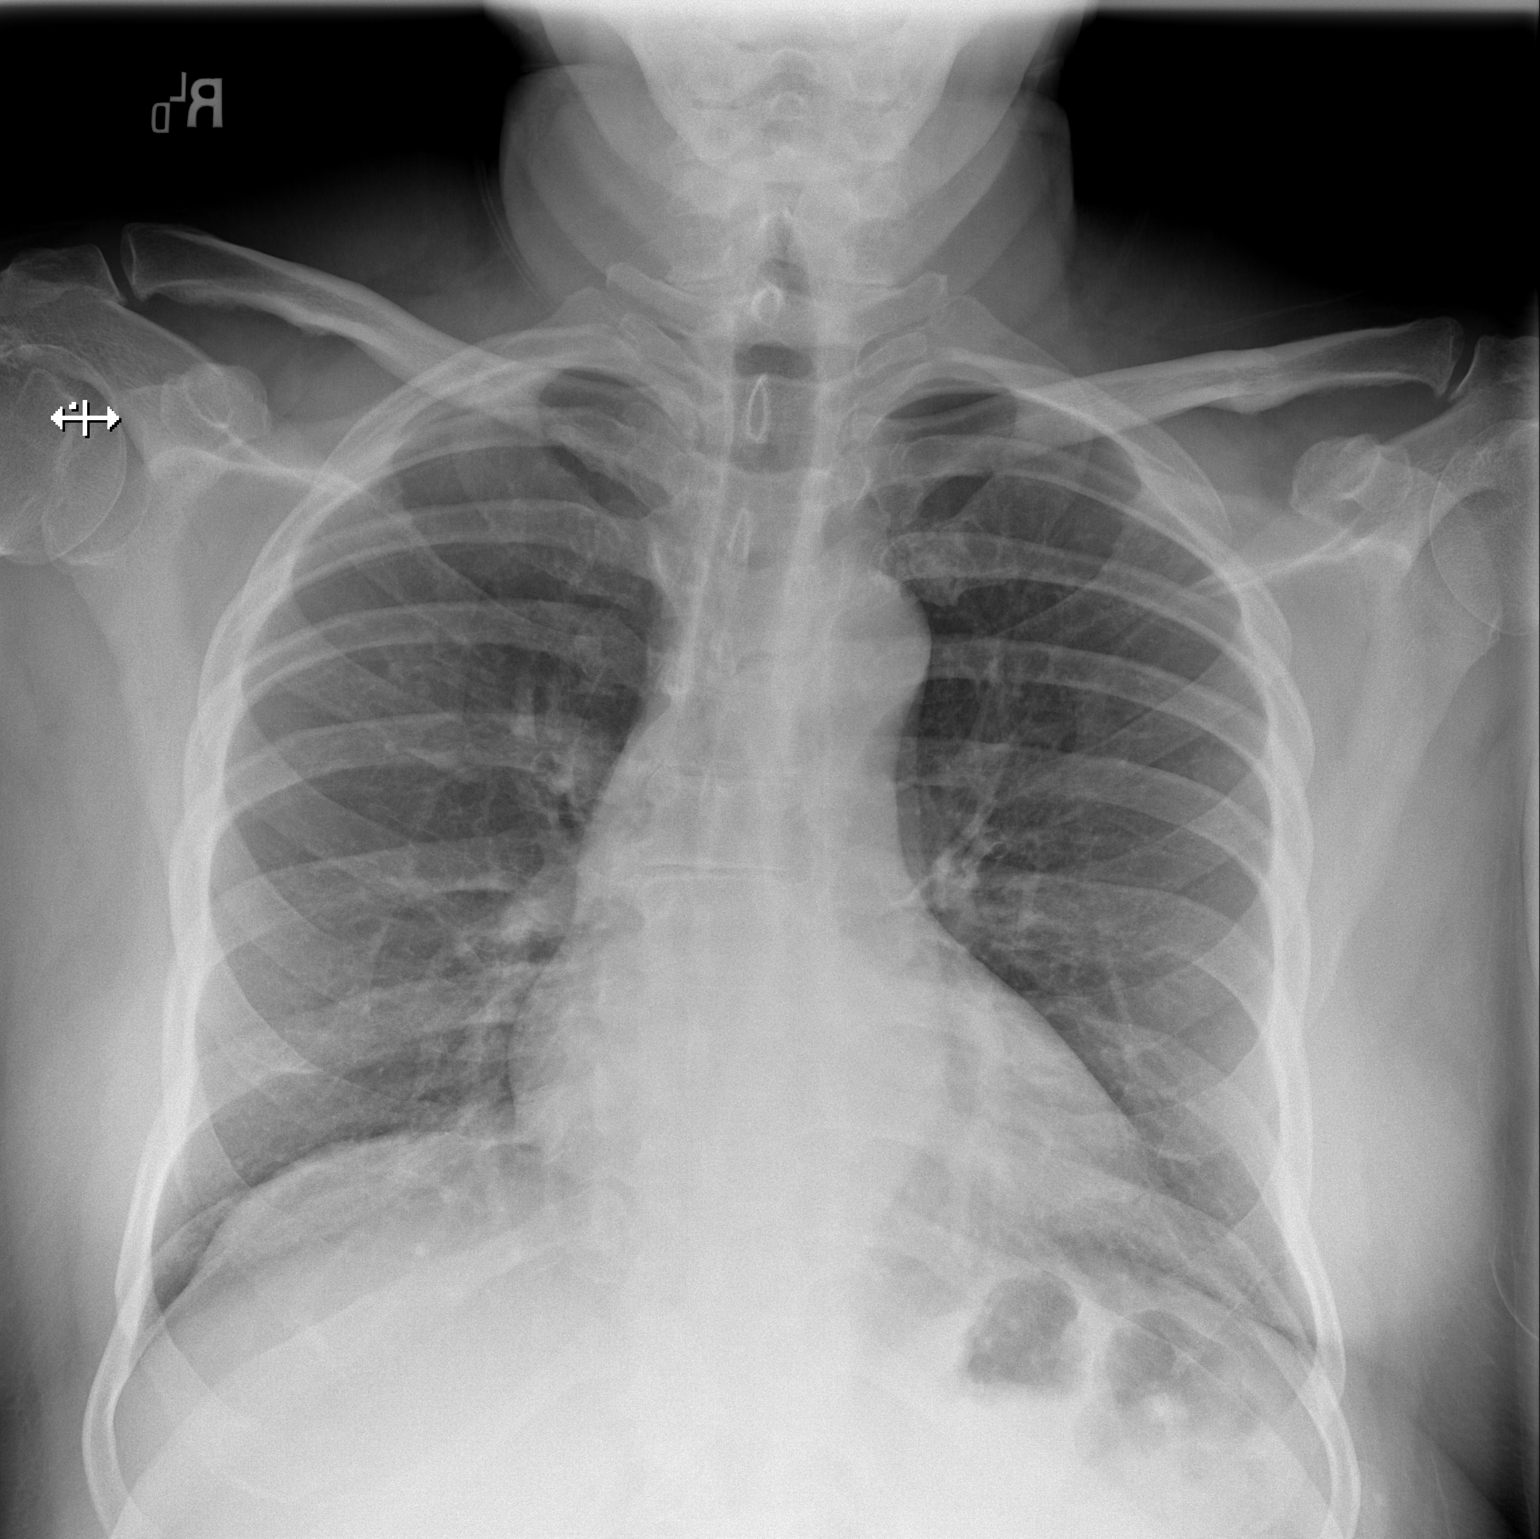

[w chest lat]
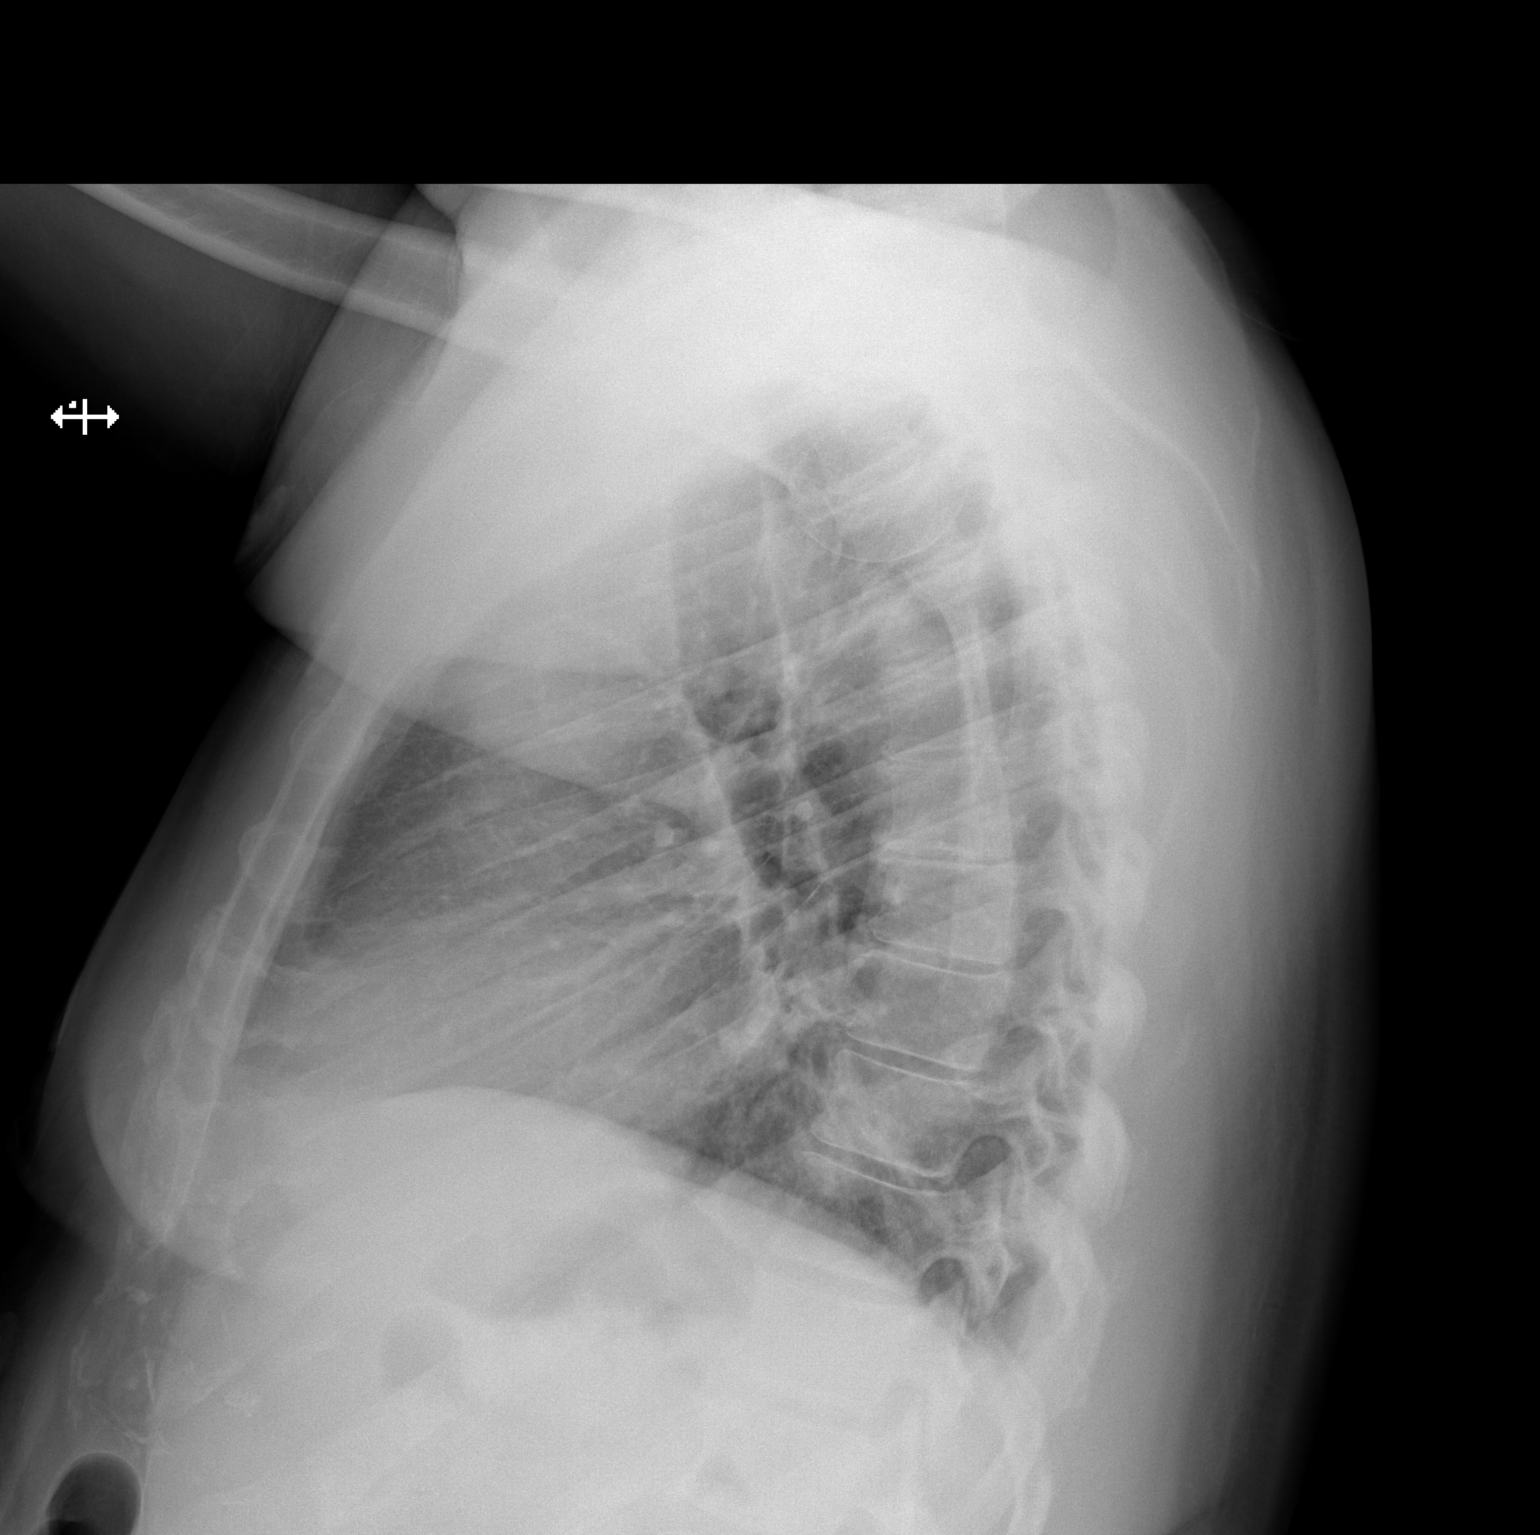

[2 of 2 positions shown; findings below may reference images not displayed]

FINDINGS: PA and lateral views. Stable cardiomegaly and mediastinal contours.
Visualized tracheal air column is within normal limits. Low normal
lung volumes. Both lungs appear clear. No pneumothorax or pleural
effusion.

No acute osseous abnormality identified. Negative visible bowel gas
pattern.
IMPRESSION: No acute cardiopulmonary abnormality.

## 2019-10-19 ENCOUNTER — Ambulatory Visit: Payer: Medicare HMO

## 2019-10-20 DIAGNOSIS — M545 Low back pain: Secondary | ICD-10-CM | POA: Diagnosis not present

## 2019-10-20 DIAGNOSIS — Z79899 Other long term (current) drug therapy: Secondary | ICD-10-CM | POA: Diagnosis not present

## 2019-10-21 ENCOUNTER — Other Ambulatory Visit: Payer: Self-pay

## 2019-10-21 ENCOUNTER — Encounter (HOSPITAL_COMMUNITY): Payer: Self-pay | Admitting: Emergency Medicine

## 2019-10-21 ENCOUNTER — Emergency Department (HOSPITAL_COMMUNITY): Payer: Medicare HMO

## 2019-10-21 ENCOUNTER — Emergency Department (HOSPITAL_COMMUNITY)
Admission: EM | Admit: 2019-10-21 | Discharge: 2019-10-21 | Disposition: A | Discharge status: Home / Self Care | Payer: Medicare HMO | Attending: Emergency Medicine | Admitting: Emergency Medicine

## 2019-10-21 DIAGNOSIS — E1165 Type 2 diabetes mellitus with hyperglycemia: Secondary | ICD-10-CM | POA: Diagnosis not present

## 2019-10-21 DIAGNOSIS — Z794 Long term (current) use of insulin: Secondary | ICD-10-CM | POA: Diagnosis not present

## 2019-10-21 DIAGNOSIS — L97929 Non-pressure chronic ulcer of unspecified part of left lower leg with unspecified severity: Secondary | ICD-10-CM | POA: Insufficient documentation

## 2019-10-21 DIAGNOSIS — Z79899 Other long term (current) drug therapy: Secondary | ICD-10-CM | POA: Insufficient documentation

## 2019-10-21 DIAGNOSIS — Z809 Family history of malignant neoplasm, unspecified: Secondary | ICD-10-CM | POA: Diagnosis not present

## 2019-10-21 DIAGNOSIS — E1122 Type 2 diabetes mellitus with diabetic chronic kidney disease: Secondary | ICD-10-CM | POA: Diagnosis not present

## 2019-10-21 DIAGNOSIS — N183 Chronic kidney disease, stage 3 unspecified: Secondary | ICD-10-CM | POA: Insufficient documentation

## 2019-10-21 DIAGNOSIS — Z7982 Long term (current) use of aspirin: Secondary | ICD-10-CM | POA: Insufficient documentation

## 2019-10-21 DIAGNOSIS — Z89511 Acquired absence of right leg below knee: Secondary | ICD-10-CM | POA: Diagnosis not present

## 2019-10-21 DIAGNOSIS — T8789 Other complications of amputation stump: Secondary | ICD-10-CM | POA: Diagnosis not present

## 2019-10-21 DIAGNOSIS — I129 Hypertensive chronic kidney disease with stage 1 through stage 4 chronic kidney disease, or unspecified chronic kidney disease: Secondary | ICD-10-CM | POA: Insufficient documentation

## 2019-10-21 DIAGNOSIS — E1140 Type 2 diabetes mellitus with diabetic neuropathy, unspecified: Secondary | ICD-10-CM | POA: Insufficient documentation

## 2019-10-21 DIAGNOSIS — L97919 Non-pressure chronic ulcer of unspecified part of right lower leg with unspecified severity: Secondary | ICD-10-CM

## 2019-10-21 DIAGNOSIS — M25562 Pain in left knee: Secondary | ICD-10-CM | POA: Diagnosis not present

## 2019-10-21 NOTE — Discharge Instructions (Addendum)
Please continue to add bulky socks to your stump to provide better fitting thus decreased friction rub that contributes to your ulceration.  Apply Vaseline or Neosporin to the ulceration area to decrease risk of infection.  Call and follow-up closely with your specialist to get new hardware for better fitment.

## 2019-10-21 NOTE — ED Triage Notes (Signed)
Pt reports "sore" on R knee x 2 weeks.

## 2019-10-21 NOTE — ED Provider Notes (Signed)
[facility] EMERGENCY DEPARTMENT Provider Note   CSN: [id number] Arrival date & time: 10/21/19  1003     History Chief Complaint  Patient presents with  . Knee Pain    Keith [name] is a 54 y.o. male.  The history is provided by the patient and medical records. No language interpreter was used.  Knee Pain Associated symptoms: no fever      54 year old male with history of diabetes, chronic knee pain, HIV/AIDS, presenting complaining of knee irritation.  Patient states he has a right BKA with orthotic however within the past 6 months, his leg has become much smaller than before thus his stump is no longer fits well.  This is because problem with recurrent sore to his stump due to ill fitting hardware.  He does have a specialist that help manage this condition.  He has gone through 3 separate sizing's of his stump hardware in the past as the size of his stump become smaller and smaller.  Recently for the past 2 weeks he endorsed a sore to his BKA which worries him.  He denies any significant pain at the site and reported only mild irritation.  He does have an appointment in several weeks to be reevaluated but would like to have it rechecked here.  Does not complain of any fever chills.  States that his HIV is currently well controlled with normal CD4 count.  He denies any new injury.  He has been wearing more socks to help provide better fitment per recommendation of his specialist.  Past Medical History:  Diagnosis Date  . Abscess of right foot    abscess/ulcer of R transtibial amputation requiring IV abx  . AIDS ([place])   . Chronic anemia   . Chronic knee pain    right  . Chronic pain   . CKD (chronic kidney disease), stage II   . Diabetes type 2, uncontrolled ([place])    HgA1c 17.6 (04/27/2010)  . Diabetic foot ulcer ([place]) 01/2017   right foot  . Erectile dysfunction   . Genital warts   . HIV (human immunodeficiency virus infection) ([place]) 2009   CD4 count 100,  VL 13800 (05/01/2010)  . Hypertension   . Osteomyelitis ([place])    h/o hand  . Osteomyelitis of metatarsal ([place]) 04/28/2017    Patient Active Problem List   Diagnosis Date Noted  . Uncontrolled type 2 diabetes mellitus with hyperglycemia ([place]) 01/14/2019  . Elevated glucose 01/14/2019  . Hemoglobin A1C between 7% and 9% indicating borderline diabetic control 01/14/2019  . Hx of BKA, right ([place]) 01/14/2019  . Pressure injury of skin 11/11/2018  . Acute myocardial infarction ([place]) 11/10/2018  . CKD (chronic kidney disease), stage II 11/10/2018  . Amputation stump infection ([place]) 10/28/2018  . Type II diabetes mellitus with renal manifestations ([place]) 08/07/2018  . Uncontrolled type 2 diabetes mellitus with hyperosmolar nonketotic hyperglycemia ([place]) 08/07/2018  . Non-pressure chronic ulcer of right calf limited to breakdown of skin ([place]) 07/06/2018  . Type 2 diabetes mellitus without complication, without long-term current use of insulin ([place]) 06/15/2018  . Chronic low back pain without sciatica 06/15/2018  . Idiopathic chronic venous hypertension of left lower extremity with ulcer and inflammation ([place])   . Venous stasis ulcer of left calf limited to breakdown of skin without varicose veins ([place]) 04/23/2018  . Acquired absence of right leg below knee ([place]) 06/13/2017  . Acute renal failure superimposed on stage 3 chronic kidney disease ([place]) 03/15/2017  .  Diabetic polyneuropathy associated with type 2 diabetes mellitus ([place]) 01/23/2017  . AKI (acute kidney injury) ([place]) 09/28/2016  . Nausea vomiting and diarrhea 09/28/2016  . Onychomycosis of multiple toenails with type 2 diabetes mellitus ([place]) 08/29/2015  . MRSA carrier 04/20/2014  . Penile wart 02/22/2014  . HIV disease ([place])   . Insulin-requiring or dependent type II diabetes mellitus ([place]) 02/04/2014  . Dental anomaly 11/20/2012  . Arthritis of right knee 02/23/2012  . Hyperlipidemia 11/10/2011  . Chronic pain 08/07/2011  .  Meralgia paraesthetica 04/23/2011  . ERECTILE DYSFUNCTION 08/22/2008  . Essential hypertension 05/19/2008    Past Surgical History:  Procedure Laterality Date  . AMPUTATION Right 05/02/2017   Procedure: AMPUTATION TRANSMETARSAL;  Surgeon: [name], MD;  Location: [facility];  Service: Orthopedics;  Laterality: Right;  . AMPUTATION Right 06/13/2017   Procedure: RIGHT BELOW KNEE AMPUTATION;  Surgeon: [name], MD;  Location: [facility];  Service: Orthopedics;  Laterality: Right;  . BELOW KNEE LEG AMPUTATION Right 06/13/2017  . BELOW KNEE LEG AMPUTATION    . CORONARY STENT INTERVENTION N/A 11/10/2018   Procedure: CORONARY STENT INTERVENTION;  Surgeon: Leonie Man, MD;  Location: [facility];  Service: Cardiovascular;  Laterality: N/A;  . HERNIA REPAIR    . I & D EXTREMITY Left 08/21/2014   Procedure: INCISION AND DRAINAGE LEFT SMALL FINGER;  Surgeon: Leanora Cover, MD;  Location: [facility];  Service: Orthopedics;  Laterality: Left;  . I & D EXTREMITY Right 03/18/2017   Procedure: IRRIGATION AND DEBRIDEMENT EXTREMITY;  Surgeon: [name], MD;  Location: [facility];  Service: Orthopedics;  Laterality: Right;  . LEFT HEART CATH AND CORONARY ANGIOGRAPHY N/A 11/10/2018   Procedure: LEFT HEART CATH AND CORONARY ANGIOGRAPHY;  Surgeon: Leonie Man, MD;  Location: [facility];  Service: Cardiovascular;  Laterality: N/A;  . MINOR IRRIGATION AND DEBRIDEMENT OF WOUND Right 04/22/2014   Procedure: IRRIGATION AND DEBRIDEMENT OF RIGHT NECK ABCESS;  Surgeon: Jerrell Belfast, MD;  Location: [facility];  Service: ENT;  Laterality: Right;  . MULTIPLE EXTRACTIONS WITH ALVEOLOPLASTY N/A 01/18/2013   Procedure: MULTIPLE EXTRACION 3, 6, 7, 10, 11, 13, 21, 22, 27, 28, 29, 30 WITH ALVEOLOPLASTY;  Surgeon: Gae Bon, DDS;  Location: [facility];  Service: Oral Surgery;  Laterality: N/A;  . SKIN SPLIT GRAFT Right 03/21/2017   Procedure: IRRIGATION AND DEBRIDEMENT RIGHT FOOT AND APPLY SPLIT THICKNESS SKIN GRAFT AND  WOUND VAC;  Surgeon: [name], MD;  Location: [facility];  Service: Orthopedics;  Laterality: Right;  . TEE WITHOUT CARDIOVERSION N/A 05/02/2017   Procedure: TRANSESOPHAGEAL ECHOCARDIOGRAM (TEE);  Surgeon: Acie Fredrickson Wonda Cheng, MD;  Location: [facility] OR;  Service: Cardiovascular;  Laterality: N/A;  coincidental to orthopedic case       Family History  Problem Relation Age of Onset  . Hypertension Mother   . Arthritis Father   . Hypertension Father   . Hypertension Brother   . Cancer Maternal Grandmother 63       unknown type of cancer  . Depression Paternal Grandmother     Social History   Tobacco Use  . Smoking status: Never Smoker  . Smokeless tobacco: Never Used  Vaping Use  . Vaping Use: Never used  Substance Use Topics  . Alcohol use: Not Currently  . Drug use: Not Currently    Comment: OTC ASA    Home Medications Prior to Admission medications   Medication Sig Start Date End Date Taking? Authorizing  Provider  amLODipine (NORVASC) 10 MG tablet Take 1 tablet (10 mg total) by mouth daily. 02/07/19   Jacqlyn Larsen, PA-C  aspirin EC 81 MG EC tablet Take 1 tablet (81 mg total) by mouth daily. 11/13/18   Cheryln Manly, NP  atorvastatin (LIPITOR) 80 MG tablet Take 1 tablet (80 mg total) by mouth daily at 6 PM. 11/13/18   Cheryln Manly, NP  bictegravir-emtricitabine-tenofovir AF (BIKTARVY) 50-200-25 MG TABS tablet Take 1 tablet by mouth daily. 10/15/18   [name], MD  blood glucose meter kit and supplies Dispense based on patient and insurance preference. Use up to four times daily as directed. (FOR ICD-10 E10.9, E11.9). Patient taking differently: 1 each by Other route See admin instructions. Dispense based on patient and insurance preference. Use up to four times daily as directed. (FOR ICD-10 E10.9, E11.9). 01/13/19   [name], FNP  carvedilol (COREG) 25 MG tablet Take 1 tablet (25 mg total) by mouth 2 (two) times daily with a meal. Please make appt for future  refills. [phone]. 1st attempt. 03/22/19   Cheryln Manly, NP  collagenase (SANTYL) ointment Apply topically 2 (two) times daily at 10 AM and 5 PM. 11/03/18   Kayleen Memos, DO  EASY COMFORT PEN NEEDLES 31G X 5 MM MISC INJECT 20 UNITS DAILY AS DIRECTED. Patient taking differently: 20 Units See admin instructions. As directed 10/19/18   [name], FNP  escitalopram (LEXAPRO) 20 MG tablet Take 30 mg by mouth daily. Taking 1 & 1/2 tablet(s) daily 07/15/18   [provider]  glucose blood (COOL BLOOD GLUCOSE TEST STRIPS) test strip Use as instructed Patient taking differently: 1 each by Other route See admin instructions. Use as instructed 02/07/19   Jacqlyn Larsen, PA-C  insulin glargine (LANTUS) 100 UNIT/ML Solostar Pen Inject 20 Units into the skin daily.    [provider]  insulin lispro (HUMALOG) 100 UNIT/ML injection Inject 10 units under skin 3 times a day, with meals, for blood glucose levels greater than 125. 09/14/18 09/14/19  [name], FNP  Insulin Syringes, Disposable, U-100 0.3 ML MISC 1 Syringe by Does not apply route 3 (three) times daily. 09/22/18   [name], FNP  Lancets [facility] ULTRASOFT) lancets Use as instructed Patient taking differently: 1 each by Other route See admin instructions. Use as instructed 01/05/18   [name], FNP  LANTUS SOLOSTAR 100 UNIT/ML Solostar Pen INJECT 20 UNITS INTO THE SKIN DAILY AT 10 PM. LAST FOR 75 DAYS  01/06/19   [name], FNP  loperamide (IMODIUM A-D) 2 MG tablet Take 1 tablet (2 mg total) by mouth 4 (four) times daily as needed for diarrhea or loose stools. 01/13/19   [name], FNP  metFORMIN (GLUCOPHAGE) 1000 MG tablet Take 1,000 mg by mouth 2 (two) times daily. 03/21/19   [provider]  nitroGLYCERIN (NITROSTAT) 0.4 MG SL tablet Place 1 tablet (0.4 mg total) under the tongue every 5 (five) minutes x 3 doses as needed for chest pain. 11/13/18   Cheryln Manly, NP    oxybutynin (DITROPAN) 5 MG tablet TAKE 1 TABLET (5 MG TOTAL) BY MOUTH 2 (TWO) TIMES DAILY. 10/19/18   [name], FNP  oxyCODONE 10 MG TABS Take 1 tablet (10 mg total) by mouth daily as needed for up to 5 doses for severe pain or breakthrough pain (pain). 11/03/18   Kayleen Memos, DO  silver sulfADIAZINE (SILVADENE) 1 %  cream Apply 1 application topically daily. Apply to wound daily 11/06/18   [name], MD  ticagrelor (BRILINTA) 90 MG TABS tablet Take 1 tablet (90 mg total) by mouth every 12 (twelve) hours. 11/13/18   Cheryln Manly, NP    Allergies    Sulfur and Sulfa antibiotics  Review of Systems   Review of Systems  Constitutional: Negative for fever.  Skin: Positive for wound.    Physical Exam Updated Vital Signs BP (!) 147/92 (BP Location: Right Arm)   Pulse 72   Temp 98.2 F (36.8 C) (Oral)   Resp 18   Ht _0  (1.753 m)   Wt 127 kg   SpO2 99%   BMI 41.35 kg/m   Physical Exam Vitals and nursing note reviewed.  Constitutional:      General: He is not in acute distress.    Appearance: He is well-developed.  HENT:     Head: Atraumatic.  Eyes:     Conjunctiva/sclera: Conjunctivae normal.  Musculoskeletal:     Cervical back: Neck supple.  Skin:    Findings: No rash.     Comments: Right lower extremity: Patient has a BKA at the mid tib region with a 1 cm ulceration anteriorly approximately 2 mm in depth with normal granular tissue.  It is minimally tender without significant warmth or erythema.  No abscess.  Neurological:     Mental Status: He is alert.     ED Results / Procedures / Treatments   Labs (all labs ordered are listed, but only abnormal results are displayed) Labs Reviewed - No data to display  EKG None  Radiology DG Tibia/Fibula Right  Result Date: 10/21/2019 CLINICAL DATA:  Ulceration along amputation stump site EXAM: RIGHT TIBIA AND FIBULA - 2 VIEW COMPARISON:  March 29, 2019 FINDINGS: Frontal and lateral views obtained. There  has been amputation at the level of the proximal tibia and fibular diaphysis levels. There is soft tissue irregularity in the stump region consistent with ulceration. No radiopaque foreign body in this area. No bony destruction or erosion. No fracture or dislocation. No appreciable joint space narrowing. No knee joint effusion. IMPRESSION: Status post amputation of the proximal tibia and fibula. Apparent ulceration at stump site anteriorly. No radiopaque foreign body. No bony erosion or bony destruction. No fracture or dislocation. No appreciable arthropathy. Electronically Signed   By: Lowella Grip III M.D.   On: 10/21/2019 10:59    Procedures Procedures (including critical care time)  Medications Ordered in ED Medications - No data to display  ED Course  I have reviewed the triage vital signs and the nursing notes.  Pertinent labs & imaging results that were available during my care of the patient were reviewed by me and considered in my medical decision making (see chart for details).    MDM Rules/Calculators/A&P                          BP (!) 147/92 (BP Location: Right Arm)   Pulse 72   Temp 98.2 F (36.8 C) (Oral)   Resp 18   Ht _1  (1.753 m)   Wt 127 kg   SpO2 99%   BMI 41.35 kg/m   Final Clinical Impression(s) / ED Diagnoses Final diagnoses:  Leg ulcer, right, with unspecified severity ([place])    Rx / DC Orders ED Discharge Orders    None     10:46 AM Patient here for a sore to  the anterior aspect of his right BKA for the past 2 weeks.  This is likely secondary to an ill fitting stump hardware as it is no longer of snugly fits to his stump.  It does not appear to be infected.  There are good granular tissue, no evidence of abscess.  11:08 AM X-ray of the right tib-fib region shows a status post amputation of the proximal tibia and fibula with an apparent ulceration at the stump site anteriorly but no foreign body noted and no signs of bony erosion or bony  destruction.  No fracture or dislocation.  Encourage patient to follow-up with his specialist for revision of his stump hardware.  Recommend continue with good padding which may increase more bulky socks as well as applying Neosporin ointment or Vaseline to the stump ulceration.  Return precautions discussed.  CAre discussed with Dr. Langston Masker.    Domenic Moras, PA-C 10/21/19 1119    Wyvonnia Dusky, MD 10/21/19 [phone]

## 2019-11-04 ENCOUNTER — Encounter (HOSPITAL_COMMUNITY): Payer: Self-pay

## 2019-11-04 ENCOUNTER — Ambulatory Visit (HOSPITAL_COMMUNITY)
Admission: EM | Admit: 2019-11-04 | Discharge: 2019-11-04 | Disposition: A | Discharge status: Home / Self Care | Payer: Medicare HMO | Attending: Family Medicine | Admitting: Family Medicine

## 2019-11-04 ENCOUNTER — Ambulatory Visit (INDEPENDENT_AMBULATORY_CARE_PROVIDER_SITE_OTHER): Payer: Medicare HMO

## 2019-11-04 ENCOUNTER — Other Ambulatory Visit: Payer: Self-pay

## 2019-11-04 DIAGNOSIS — R69 Illness, unspecified: Secondary | ICD-10-CM | POA: Diagnosis not present

## 2019-11-04 DIAGNOSIS — I129 Hypertensive chronic kidney disease with stage 1 through stage 4 chronic kidney disease, or unspecified chronic kidney disease: Secondary | ICD-10-CM | POA: Insufficient documentation

## 2019-11-04 DIAGNOSIS — Z794 Long term (current) use of insulin: Secondary | ICD-10-CM | POA: Diagnosis not present

## 2019-11-04 DIAGNOSIS — Z89511 Acquired absence of right leg below knee: Secondary | ICD-10-CM | POA: Insufficient documentation

## 2019-11-04 DIAGNOSIS — Z955 Presence of coronary angioplasty implant and graft: Secondary | ICD-10-CM | POA: Diagnosis not present

## 2019-11-04 DIAGNOSIS — Z882 Allergy status to sulfonamides status: Secondary | ICD-10-CM | POA: Diagnosis not present

## 2019-11-04 DIAGNOSIS — E1142 Type 2 diabetes mellitus with diabetic polyneuropathy: Secondary | ICD-10-CM | POA: Insufficient documentation

## 2019-11-04 DIAGNOSIS — M545 Low back pain: Secondary | ICD-10-CM | POA: Diagnosis not present

## 2019-11-04 DIAGNOSIS — E1151 Type 2 diabetes mellitus with diabetic peripheral angiopathy without gangrene: Secondary | ICD-10-CM | POA: Diagnosis not present

## 2019-11-04 DIAGNOSIS — E785 Hyperlipidemia, unspecified: Secondary | ICD-10-CM | POA: Insufficient documentation

## 2019-11-04 DIAGNOSIS — N183 Chronic kidney disease, stage 3 unspecified: Secondary | ICD-10-CM | POA: Diagnosis not present

## 2019-11-04 DIAGNOSIS — I252 Old myocardial infarction: Secondary | ICD-10-CM | POA: Insufficient documentation

## 2019-11-04 DIAGNOSIS — Z7982 Long term (current) use of aspirin: Secondary | ICD-10-CM | POA: Insufficient documentation

## 2019-11-04 DIAGNOSIS — R059 Cough, unspecified: Secondary | ICD-10-CM

## 2019-11-04 DIAGNOSIS — R05 Cough: Secondary | ICD-10-CM

## 2019-11-04 DIAGNOSIS — Z8631 Personal history of diabetic foot ulcer: Secondary | ICD-10-CM | POA: Diagnosis not present

## 2019-11-04 DIAGNOSIS — J22 Unspecified acute lower respiratory infection: Secondary | ICD-10-CM | POA: Insufficient documentation

## 2019-11-04 DIAGNOSIS — Z20822 Contact with and (suspected) exposure to covid-19: Secondary | ICD-10-CM | POA: Diagnosis not present

## 2019-11-04 DIAGNOSIS — Z79899 Other long term (current) drug therapy: Secondary | ICD-10-CM | POA: Insufficient documentation

## 2019-11-04 DIAGNOSIS — E669 Obesity, unspecified: Secondary | ICD-10-CM | POA: Diagnosis not present

## 2019-11-04 DIAGNOSIS — G8929 Other chronic pain: Secondary | ICD-10-CM | POA: Diagnosis not present

## 2019-11-04 DIAGNOSIS — Z21 Asymptomatic human immunodeficiency virus [HIV] infection status: Secondary | ICD-10-CM | POA: Diagnosis not present

## 2019-11-04 DIAGNOSIS — Z8249 Family history of ischemic heart disease and other diseases of the circulatory system: Secondary | ICD-10-CM | POA: Diagnosis not present

## 2019-11-04 DIAGNOSIS — E1122 Type 2 diabetes mellitus with diabetic chronic kidney disease: Secondary | ICD-10-CM | POA: Diagnosis not present

## 2019-11-04 DIAGNOSIS — Z22322 Carrier or suspected carrier of Methicillin resistant Staphylococcus aureus: Secondary | ICD-10-CM | POA: Diagnosis not present

## 2019-11-04 DIAGNOSIS — J9811 Atelectasis: Secondary | ICD-10-CM | POA: Diagnosis not present

## 2019-11-04 LAB — SARS CORONAVIRUS 2 (TAT 6-24 HRS): SARS Coronavirus 2: NEGATIVE

## 2019-11-04 MED ORDER — BENZONATATE 200 MG PO CAPS
200.0000 mg | ORAL_CAPSULE | Freq: Two times a day (BID) | ORAL | 0 refills | Status: DC | PRN
Start: 2019-11-04 — End: 2020-03-22

## 2019-11-04 MED ORDER — AMOXICILLIN-POT CLAVULANATE 875-125 MG PO TABS
1.0000 | ORAL_TABLET | Freq: Two times a day (BID) | ORAL | 0 refills | Status: DC
Start: 2019-11-04 — End: 2019-11-29

## 2019-11-04 NOTE — Discharge Instructions (Signed)
Go home to rest Drink plenty of fluids Take Tylenol for pain or fever You may take over-the-counter cough and cold medicines as needed You must quarantine at home until your test result is available You can check for your test result in MyChart I have prescribed antibiotics and cough medication Follow up with your Primary Care Physician.

## 2019-11-04 NOTE — ED Triage Notes (Signed)
Pt c/o cough and congestion x 2 weeks, taking Nyquil at home without relief, not covid vaccinated, had COVID last year

## 2019-11-04 NOTE — ED Provider Notes (Signed)
[facility]    CSN: [id number] Arrival date & time: 11/04/19  8416      History   Chief Complaint Chief Complaint  Patient presents with  . Cough    HPI Keith [name] is a 54 y.o. male.   HPI  Patient is here for cough for 2 weeks.  He states he feels very tired.  He states his chest feels congestion.  He is bringing up yellow sputum.  No chest pain.  No nausea or vomiting. No headaches, body aches, or fever Patient had a Covid infection last fall.  He has not gotten Covid vaccinations.  This is recommended for him. Patient has a history of well-controlled HIV He also is an obese diabetic, with hypertension, hyperlipidemia.  This places him at high risk for Covid if he gets it.  Again, emphasized the need for vaccination  Past Medical History:  Diagnosis Date  . Abscess of right foot    abscess/ulcer of R transtibial amputation requiring IV abx  . AIDS ([place])   . Chronic anemia   . Chronic knee pain    right  . Chronic pain   . CKD (chronic kidney disease), stage II   . Diabetes type 2, uncontrolled ([place])    HgA1c 17.6 (04/27/2010)  . Diabetic foot ulcer ([place]) 01/2017   right foot  . Erectile dysfunction   . Genital warts   . HIV (human immunodeficiency virus infection) ([place]) 2009   CD4 count 100, VL 13800 (05/01/2010)  . Hypertension   . Osteomyelitis ([place])    h/o hand  . Osteomyelitis of metatarsal ([place]) 04/28/2017    Patient Active Problem List   Diagnosis Date Noted  . Uncontrolled type 2 diabetes mellitus with hyperglycemia ([place]) 01/14/2019  . Elevated glucose 01/14/2019  . Hemoglobin A1C between 7% and 9% indicating borderline diabetic control 01/14/2019  . Hx of BKA, right ([place]) 01/14/2019  . Pressure injury of skin 11/11/2018  . Acute myocardial infarction ([place]) 11/10/2018  . CKD (chronic kidney disease), stage II 11/10/2018  . Amputation stump infection ([place]) 10/28/2018  . Type II diabetes mellitus with renal manifestations ([place])  08/07/2018  . Uncontrolled type 2 diabetes mellitus with hyperosmolar nonketotic hyperglycemia ([place]) 08/07/2018  . Non-pressure chronic ulcer of right calf limited to breakdown of skin ([place]) 07/06/2018  . Type 2 diabetes mellitus without complication, without long-term current use of insulin ([place]) 06/15/2018  . Chronic low back pain without sciatica 06/15/2018  . Idiopathic chronic venous hypertension of left lower extremity with ulcer and inflammation ([place])   . Venous stasis ulcer of left calf limited to breakdown of skin without varicose veins ([place]) 04/23/2018  . Acquired absence of right leg below knee ([place]) 06/13/2017  . Acute renal failure superimposed on stage 3 chronic kidney disease ([place]) 03/15/2017  . Diabetic polyneuropathy associated with type 2 diabetes mellitus ([place]) 01/23/2017  . AKI (acute kidney injury) ([place]) 09/28/2016  . Nausea vomiting and diarrhea 09/28/2016  . Onychomycosis of multiple toenails with type 2 diabetes mellitus ([place]) 08/29/2015  . MRSA carrier 04/20/2014  . Penile wart 02/22/2014  . HIV disease ([place])   . Insulin-requiring or dependent type II diabetes mellitus ([place]) 02/04/2014  . Dental anomaly 11/20/2012  . Arthritis of right knee 02/23/2012  . Hyperlipidemia 11/10/2011  . Chronic pain 08/07/2011  . Meralgia paraesthetica 04/23/2011  . ERECTILE DYSFUNCTION 08/22/2008  . Essential hypertension 05/19/2008    Past Surgical History:  Procedure Laterality Date  . AMPUTATION Right 05/02/2017  Procedure: AMPUTATION TRANSMETARSAL;  Surgeon: [name], MD;  Location: [facility];  Service: Orthopedics;  Laterality: Right;  . AMPUTATION Right 06/13/2017   Procedure: RIGHT BELOW KNEE AMPUTATION;  Surgeon: [name], MD;  Location: [facility];  Service: Orthopedics;  Laterality: Right;  . BELOW KNEE LEG AMPUTATION Right 06/13/2017  . BELOW KNEE LEG AMPUTATION    . CORONARY STENT INTERVENTION N/A 11/10/2018   Procedure: CORONARY STENT INTERVENTION;  Surgeon:  Leonie Man, MD;  Location: [facility];  Service: Cardiovascular;  Laterality: N/A;  . HERNIA REPAIR    . I & D EXTREMITY Left 08/21/2014   Procedure: INCISION AND DRAINAGE LEFT SMALL FINGER;  Surgeon: Leanora Cover, MD;  Location: [facility];  Service: Orthopedics;  Laterality: Left;  . I & D EXTREMITY Right 03/18/2017   Procedure: IRRIGATION AND DEBRIDEMENT EXTREMITY;  Surgeon: [name], MD;  Location: [facility];  Service: Orthopedics;  Laterality: Right;  . LEFT HEART CATH AND CORONARY ANGIOGRAPHY N/A 11/10/2018   Procedure: LEFT HEART CATH AND CORONARY ANGIOGRAPHY;  Surgeon: Leonie Man, MD;  Location: [facility];  Service: Cardiovascular;  Laterality: N/A;  . MINOR IRRIGATION AND DEBRIDEMENT OF WOUND Right 04/22/2014   Procedure: IRRIGATION AND DEBRIDEMENT OF RIGHT NECK ABCESS;  Surgeon: Jerrell Belfast, MD;  Location: [facility];  Service: ENT;  Laterality: Right;  . MULTIPLE EXTRACTIONS WITH ALVEOLOPLASTY N/A 01/18/2013   Procedure: MULTIPLE EXTRACION 3, 6, 7, 10, 11, 13, 21, 22, 27, 28, 29, 30 WITH ALVEOLOPLASTY;  Surgeon: Gae Bon, DDS;  Location: [facility];  Service: Oral Surgery;  Laterality: N/A;  . SKIN SPLIT GRAFT Right 03/21/2017   Procedure: IRRIGATION AND DEBRIDEMENT RIGHT FOOT AND APPLY SPLIT THICKNESS SKIN GRAFT AND WOUND VAC;  Surgeon: [name], MD;  Location: [facility];  Service: Orthopedics;  Laterality: Right;  . TEE WITHOUT CARDIOVERSION N/A 05/02/2017   Procedure: TRANSESOPHAGEAL ECHOCARDIOGRAM (TEE);  Surgeon: Acie Fredrickson Wonda Cheng, MD;  Location: [facility];  Service: Cardiovascular;  Laterality: N/A;  coincidental to orthopedic case       Home Medications    Prior to Admission medications   Medication Sig Start Date End Date Taking? Authorizing Provider  aspirin EC 81 MG EC tablet Take 1 tablet (81 mg total) by mouth daily. 11/13/18  Yes Cheryln Manly, NP  atorvastatin (LIPITOR) 80 MG tablet Take 1 tablet (80 mg total) by mouth daily at 6 PM. 11/13/18  Yes  Cheryln Manly, NP  bictegravir-emtricitabine-tenofovir AF (BIKTARVY) 50-200-25 MG TABS tablet Take 1 tablet by mouth daily. 10/15/18  Yes [name], MD  carvedilol (COREG) 25 MG tablet Take 1 tablet (25 mg total) by mouth 2 (two) times daily with a meal. Please make appt for future refills. [phone]. 1st attempt. 03/22/19  Yes Reino Bellis B, NP  amLODipine (NORVASC) 10 MG tablet Take 1 tablet (10 mg total) by mouth daily. 02/07/19   Jacqlyn Larsen, PA-C  amoxicillin-clavulanate (AUGMENTIN) 875-125 MG tablet Take 1 tablet by mouth every 12 (twelve) hours. 11/04/19   Raylene Everts, MD  benzonatate (TESSALON) 200 MG capsule Take 1 capsule (200 mg total) by mouth 2 (two) times daily as needed for cough. 11/04/19   Raylene Everts, MD  blood glucose meter kit and supplies Dispense based on patient and insurance preference. Use up to four times daily as directed. (FOR ICD-10 E10.9, E11.9). Patient taking differently: 1 each by Other route See admin instructions. Dispense based on  patient and insurance preference. Use up to four times daily as directed. (FOR ICD-10 E10.9, E11.9). 01/13/19   [name], FNP  collagenase (SANTYL) ointment Apply topically 2 (two) times daily at 10 AM and 5 PM. 11/03/18   Kayleen Memos, DO  EASY COMFORT PEN NEEDLES 31G X 5 MM MISC INJECT 20 UNITS DAILY AS DIRECTED. Patient taking differently: 20 Units See admin instructions. As directed 10/19/18   [name], FNP  escitalopram (LEXAPRO) 20 MG tablet Take 30 mg by mouth daily. Taking 1 & 1/2 tablet(s) daily 07/15/18   [provider]  glucose blood (COOL BLOOD GLUCOSE TEST STRIPS) test strip Use as instructed Patient taking differently: 1 each by Other route See admin instructions. Use as instructed 02/07/19   Jacqlyn Larsen, PA-C  insulin glargine (LANTUS) 100 UNIT/ML Solostar Pen Inject 20 Units into the skin daily.    [provider]  insulin lispro (HUMALOG) 100 UNIT/ML  injection Inject 10 units under skin 3 times a day, with meals, for blood glucose levels greater than 125. 09/14/18 09/14/19  [name], FNP  Insulin Syringes, Disposable, U-100 0.3 ML MISC 1 Syringe by Does not apply route 3 (three) times daily. 09/22/18   [name], FNP  Lancets [facility] ULTRASOFT) lancets Use as instructed Patient taking differently: 1 each by Other route See admin instructions. Use as instructed 01/05/18   [name], FNP  LANTUS SOLOSTAR 100 UNIT/ML Solostar Pen INJECT 20 UNITS INTO THE SKIN DAILY AT 10 PM. ** LAST FOR 75 DAYS  01/06/19   [name], FNP  loperamide (IMODIUM A-D) 2 MG tablet Take 1 tablet (2 mg total) by mouth 4 (four) times daily as needed for diarrhea or loose stools. 01/13/19   [name], FNP  metFORMIN (GLUCOPHAGE) 1000 MG tablet Take 1,000 mg by mouth 2 (two) times daily. 03/21/19   [provider]  nitroGLYCERIN (NITROSTAT) 0.4 MG SL tablet Place 1 tablet (0.4 mg total) under the tongue every 5 (five) minutes x 3 doses as needed for chest pain. 11/13/18   Cheryln Manly, NP  oxybutynin (DITROPAN) 5 MG tablet TAKE 1 TABLET (5 MG TOTAL) BY MOUTH 2 (TWO) TIMES DAILY. 10/19/18   [name], FNP  oxyCODONE 10 MG TABS Take 1 tablet (10 mg total) by mouth daily as needed for up to 5 doses for severe pain or breakthrough pain (pain). 11/03/18   Kayleen Memos, DO  silver sulfADIAZINE (SILVADENE) 1 % cream Apply 1 application topically daily. Apply to wound daily 11/06/18   [name], MD  ticagrelor (BRILINTA) 90 MG TABS tablet Take 1 tablet (90 mg total) by mouth every 12 (twelve) hours. 11/13/18   Cheryln Manly, NP    Family History Family History  Problem Relation Age of Onset  . Hypertension Mother   . Arthritis Father   . Hypertension Father   . Hypertension Brother   . Cancer Maternal Grandmother 21       unknown type of cancer  . Depression Paternal Grandmother     Social History Social History    Tobacco Use  . Smoking status: Never Smoker  . Smokeless tobacco: Never Used  Vaping Use  . Vaping Use: Never used  Substance Use Topics  . Alcohol use: Not Currently  . Drug use: Not Currently    Comment: OTC ASA     Allergies   Sulfur and Sulfa antibiotics   Review of Systems  Review of Systems See HPI Physical Exam Triage Vital Signs ED Triage Vitals  Enc Vitals Group     BP 11/04/19 0825 (!) 152/91     Pulse Rate 11/04/19 0825 78     Resp 11/04/19 0825 16     Temp 11/04/19 0825 98.3 F (36.8 C)     Temp src --      SpO2 11/04/19 0825 97 %     Weight --      Height --      Head Circumference --      Peak Flow --      Pain Score 11/04/19 0826 0     Pain Loc --      Pain Edu? --      Excl. in [place]? --    No data found.  Updated Vital Signs BP (!) 152/91   Pulse 78   Temp 98.3 F (36.8 C)   Resp 16   SpO2 97%      Physical Exam Constitutional:      General: He is not in acute distress.    Appearance: He is well-developed. He is obese.  HENT:     Head: Normocephalic and atraumatic.     Mouth/Throat:     Mouth: Mucous membranes are moist.  Eyes:     Conjunctiva/sclera: Conjunctivae normal.     Pupils: Pupils are equal, round, and reactive to light.  Cardiovascular:     Rate and Rhythm: Normal rate.  Pulmonary:     Effort: Pulmonary effort is normal. No respiratory distress.     Comments: Crackles right base Abdominal:     General: There is no distension.     Palpations: Abdomen is soft.  Musculoskeletal:        General: Normal range of motion.     Cervical back: Normal range of motion.  Skin:    General: Skin is warm and dry.  Neurological:     Mental Status: He is alert.  Psychiatric:        Mood and Affect: Mood normal.        Behavior: Behavior normal.      UC Treatments / Results  Labs (all labs ordered are listed, but only abnormal results are displayed) Labs Reviewed  SARS CORONAVIRUS 2 (TAT 6-24 HRS)     EKG   Radiology DG Chest 2 View  Result Date: 11/04/2019 CLINICAL DATA:  Cough for 2 weeks EXAM: CHEST - 2 VIEW COMPARISON:  08/23/2019 FINDINGS: Enlargement of cardiac silhouette. Mediastinal contours and pulmonary vascularity normal. Minimal subsegmental atelectasis at RIGHT base. Lungs otherwise clear. No infiltrate, pleural effusion or pneumothorax. Osseous structures unremarkable. IMPRESSION: Minimal subsegmental atelectasis RIGHT base. Enlargement of cardiac silhouette. Electronically Signed   By: Lavonia Dana M.D.   On: 11/04/2019 08:48    Procedures Procedures (including critical care time)  Medications Ordered in UC Medications - No data to display  Initial Impression / Assessment and Plan / UC Course  I have reviewed the triage vital signs and the nursing notes.  Pertinent labs & imaging results that were available during my care of the patient were reviewed by me and considered in my medical decision making (see chart for details).     Since his infections been going on for 2 weeks, I am going to cover him with antibiotics.  Coronavirus testing is done.  Symptomatic care is reviewed Final Clinical Impressions(s) / UC Diagnoses   Final diagnoses:  Cough  LRTI (lower respiratory tract infection)  Discharge Instructions     Go home to rest Drink plenty of fluids Take Tylenol for pain or fever You may take over-the-counter cough and cold medicines as needed You must quarantine at home until your test result is available You can check for your test result in MyChart I have prescribed antibiotics and cough medication Follow up with your Primary Care Physician.    ED Prescriptions    Medication Sig Dispense Auth. Provider   amoxicillin-clavulanate (AUGMENTIN) 875-125 MG tablet Take 1 tablet by mouth every 12 (twelve) hours. 14 tablet Raylene Everts, MD   benzonatate (TESSALON) 200 MG capsule Take 1 capsule (200 mg total) by mouth 2 (two) times daily as  needed for cough. 20 capsule Raylene Everts, MD     PDMP not reviewed this encounter.   Raylene Everts, MD 11/04/19 1344

## 2019-11-08 ENCOUNTER — Encounter: Payer: Self-pay | Admitting: Family Medicine

## 2019-11-09 ENCOUNTER — Ambulatory Visit: Payer: Medicare HMO | Admitting: Infectious Diseases

## 2019-11-10 ENCOUNTER — Other Ambulatory Visit: Payer: Self-pay | Admitting: Family Medicine

## 2019-11-10 ENCOUNTER — Other Ambulatory Visit: Payer: Self-pay | Admitting: Infectious Diseases

## 2019-11-10 DIAGNOSIS — B20 Human immunodeficiency virus [HIV] disease: Secondary | ICD-10-CM

## 2019-11-11 ENCOUNTER — Other Ambulatory Visit: Payer: Self-pay | Admitting: Family Medicine

## 2019-11-11 ENCOUNTER — Telehealth: Payer: Self-pay | Admitting: Family Medicine

## 2019-11-11 DIAGNOSIS — Z794 Long term (current) use of insulin: Secondary | ICD-10-CM

## 2019-11-11 DIAGNOSIS — E1165 Type 2 diabetes mellitus with hyperglycemia: Secondary | ICD-10-CM

## 2019-11-11 DIAGNOSIS — R7309 Other abnormal glucose: Secondary | ICD-10-CM

## 2019-11-11 DIAGNOSIS — E1169 Type 2 diabetes mellitus with other specified complication: Secondary | ICD-10-CM

## 2019-11-11 MED ORDER — LEVEMIR 100 UNIT/ML ~~LOC~~ SOLN: 20.0000 [IU] | Freq: Every day | SUBCUTANEOUS | 6 refills | Status: DC

## 2019-11-12 NOTE — Telephone Encounter (Signed)
Done

## 2019-11-18 DIAGNOSIS — M545 Low back pain: Secondary | ICD-10-CM | POA: Diagnosis not present

## 2019-11-18 DIAGNOSIS — Z79899 Other long term (current) drug therapy: Secondary | ICD-10-CM | POA: Diagnosis not present

## 2019-11-19 ENCOUNTER — Ambulatory Visit: Payer: Medicare HMO | Admitting: Family Medicine

## 2019-11-22 ENCOUNTER — Other Ambulatory Visit: Payer: Self-pay

## 2019-11-22 ENCOUNTER — Encounter (HOSPITAL_COMMUNITY): Payer: Self-pay | Admitting: Emergency Medicine

## 2019-11-22 ENCOUNTER — Emergency Department (HOSPITAL_COMMUNITY)
Admission: EM | Admit: 2019-11-22 | Discharge: 2019-11-22 | Disposition: A | Discharge status: Home / Self Care | Payer: BLUE CROSS/BLUE SHIELD | Attending: Emergency Medicine | Admitting: Emergency Medicine

## 2019-11-22 DIAGNOSIS — M79604 Pain in right leg: Secondary | ICD-10-CM | POA: Insufficient documentation

## 2019-11-22 DIAGNOSIS — Z96651 Presence of right artificial knee joint: Secondary | ICD-10-CM | POA: Diagnosis not present

## 2019-11-22 DIAGNOSIS — Z5321 Procedure and treatment not carried out due to patient leaving prior to being seen by health care provider: Secondary | ICD-10-CM | POA: Diagnosis not present

## 2019-11-22 DIAGNOSIS — E669 Obesity, unspecified: Secondary | ICD-10-CM | POA: Diagnosis not present

## 2019-11-22 DIAGNOSIS — E1169 Type 2 diabetes mellitus with other specified complication: Secondary | ICD-10-CM | POA: Diagnosis not present

## 2019-11-22 DIAGNOSIS — Z794 Long term (current) use of insulin: Secondary | ICD-10-CM | POA: Diagnosis not present

## 2019-11-22 DIAGNOSIS — Z471 Aftercare following joint replacement surgery: Secondary | ICD-10-CM | POA: Diagnosis not present

## 2019-11-22 DIAGNOSIS — R7982 Elevated C-reactive protein (CRP): Secondary | ICD-10-CM | POA: Diagnosis not present

## 2019-11-22 DIAGNOSIS — Z5189 Encounter for other specified aftercare: Secondary | ICD-10-CM | POA: Diagnosis not present

## 2019-11-22 DIAGNOSIS — R700 Elevated erythrocyte sedimentation rate: Secondary | ICD-10-CM | POA: Diagnosis not present

## 2019-11-22 NOTE — ED Triage Notes (Signed)
Pt report that has a new prothesis on right LE and having pain and sore for about 2 weeks.

## 2019-11-29 ENCOUNTER — Encounter: Payer: Self-pay | Admitting: Family Medicine

## 2019-11-29 ENCOUNTER — Other Ambulatory Visit: Payer: Self-pay

## 2019-11-29 ENCOUNTER — Ambulatory Visit (INDEPENDENT_AMBULATORY_CARE_PROVIDER_SITE_OTHER): Payer: Medicare HMO | Admitting: Family Medicine

## 2019-11-29 VITALS — BP 121/81 | HR 89 | Temp 97.3°F | Ht 70.0 in | Wt 289.0 lb

## 2019-11-29 DIAGNOSIS — E1169 Type 2 diabetes mellitus with other specified complication: Secondary | ICD-10-CM | POA: Diagnosis not present

## 2019-11-29 DIAGNOSIS — Z794 Long term (current) use of insulin: Secondary | ICD-10-CM | POA: Diagnosis not present

## 2019-11-29 DIAGNOSIS — T8789 Other complications of amputation stump: Secondary | ICD-10-CM

## 2019-11-29 DIAGNOSIS — R69 Illness, unspecified: Secondary | ICD-10-CM | POA: Diagnosis not present

## 2019-11-29 DIAGNOSIS — Z09 Encounter for follow-up examination after completed treatment for conditions other than malignant neoplasm: Secondary | ICD-10-CM

## 2019-11-29 DIAGNOSIS — F419 Anxiety disorder, unspecified: Secondary | ICD-10-CM

## 2019-11-29 DIAGNOSIS — Z89511 Acquired absence of right leg below knee: Secondary | ICD-10-CM | POA: Diagnosis not present

## 2019-11-29 NOTE — Progress Notes (Signed)
[facility]   Established Patient Office Visit  Subjective:  [name], male    DOB: December 25, 1965  Age: 54 y.o. MRN: [id number]  CC:  Chief Complaint  Patient presents with  . Knee Pain    pt states R knee    HPI Keith [name] is a 54 year old male who presents for Follow Up today.     Patient Active Problem List   Diagnosis Date Noted  . Uncontrolled type 2 diabetes mellitus with hyperglycemia ([place]) 01/14/2019  . Elevated glucose 01/14/2019  . Hemoglobin A1C between 7% and 9% indicating borderline diabetic control 01/14/2019  . Hx of BKA, right ([place]) 01/14/2019  . Pressure injury of skin 11/11/2018  . Acute myocardial infarction ([place]) 11/10/2018  . CKD (chronic kidney disease), stage II 11/10/2018  . Amputation stump infection ([place]) 10/28/2018  . Type II diabetes mellitus with renal manifestations ([place]) 08/07/2018  . Uncontrolled type 2 diabetes mellitus with hyperosmolar nonketotic hyperglycemia ([place]) 08/07/2018  . Non-pressure chronic ulcer of right calf limited to breakdown of skin ([place]) 07/06/2018  . Type 2 diabetes mellitus without complication, without long-term current use of insulin ([place]) 06/15/2018  . Chronic low back pain without sciatica 06/15/2018  . Idiopathic chronic venous hypertension of left lower extremity with ulcer and inflammation ([place])   . Venous stasis ulcer of left calf limited to breakdown of skin without varicose veins ([place]) 04/23/2018  . Acquired absence of right leg below knee ([place]) 06/13/2017  . Acute renal failure superimposed on stage 3 chronic kidney disease ([place]) 03/15/2017  . Diabetic polyneuropathy associated with type 2 diabetes mellitus ([place]) 01/23/2017  . AKI (acute kidney injury) ([place]) 09/28/2016  . Nausea vomiting and diarrhea 09/28/2016  . Onychomycosis of multiple toenails with type 2 diabetes mellitus ([place]) 08/29/2015  . MRSA carrier 04/20/2014  . Penile wart  02/22/2014  . HIV disease ([place])   . Insulin-requiring or dependent type II diabetes mellitus ([place]) 02/04/2014  . Dental anomaly 11/20/2012  . Arthritis of right knee 02/23/2012  . Hyperlipidemia 11/10/2011  . Chronic pain 08/07/2011  . Meralgia paraesthetica 04/23/2011  . ERECTILE DYSFUNCTION 08/22/2008  . Essential hypertension 05/19/2008    Past Medical History:  Diagnosis Date  . Abscess of right foot    abscess/ulcer of R transtibial amputation requiring IV abx  . AIDS ([place])   . Chronic anemia   . Chronic knee pain    right  . Chronic pain   . CKD (chronic kidney disease), stage II   . Diabetes type 2, uncontrolled ([place])    HgA1c 17.6 (04/27/2010)  . Diabetic foot ulcer ([place]) 01/2017   right foot  . Erectile dysfunction   . Genital warts   . HIV (human immunodeficiency virus infection) ([place]) 2009   CD4 count 100, VL 13800 (05/01/2010)  . Hypertension   . Osteomyelitis ([place])    h/o hand  . Osteomyelitis of metatarsal ([place]) 04/28/2017   Current Status: Since his last office visit, he has had several ED visits for wound care of his right AKA stomp wound which he has had pain X 1 month now. He reports that wound has an odor. He has been getting temporary wound care treatments in the ED. He is currently scheduled for treatment at [facility] on 12/23/2019. He continues to follow up with [name]. He denies fevers, chills, fatigue, recent infections, weight loss, and night sweats. He has  not had any headaches, visual changes, dizziness, and falls. No chest pain, heart palpitations, cough and shortness of breath reported. Denies GI problems such as nausea, vomiting, diarrhea, and constipation. He has no reports of blood in stools, dysuria and hematuria. No depression or anxiety reported today. He is taking all medications as prescribed.   Past Surgical History:  Procedure Laterality Date  . AMPUTATION Right 05/02/2017   Procedure: AMPUTATION  TRANSMETARSAL;  Surgeon: [name], MD;  Location: [facility];  Service: Orthopedics;  Laterality: Right;  . AMPUTATION Right 06/13/2017   Procedure: RIGHT BELOW KNEE AMPUTATION;  Surgeon: [name], MD;  Location: [facility];  Service: Orthopedics;  Laterality: Right;  . BELOW KNEE LEG AMPUTATION Right 06/13/2017  . BELOW KNEE LEG AMPUTATION    . CORONARY STENT INTERVENTION N/A 11/10/2018   Procedure: CORONARY STENT INTERVENTION;  Surgeon: Leonie Man, MD;  Location: [facility];  Service: Cardiovascular;  Laterality: N/A;  . HERNIA REPAIR    . I & D EXTREMITY Left 08/21/2014   Procedure: INCISION AND DRAINAGE LEFT SMALL FINGER;  Surgeon: Leanora Cover, MD;  Location: [facility];  Service: Orthopedics;  Laterality: Left;  . I & D EXTREMITY Right 03/18/2017   Procedure: IRRIGATION AND DEBRIDEMENT EXTREMITY;  Surgeon: [name], MD;  Location: [facility];  Service: Orthopedics;  Laterality: Right;  . LEFT HEART CATH AND CORONARY ANGIOGRAPHY N/A 11/10/2018   Procedure: LEFT HEART CATH AND CORONARY ANGIOGRAPHY;  Surgeon: Leonie Man, MD;  Location: [facility];  Service: Cardiovascular;  Laterality: N/A;  . MINOR IRRIGATION AND DEBRIDEMENT OF WOUND Right 04/22/2014   Procedure: IRRIGATION AND DEBRIDEMENT OF RIGHT NECK ABCESS;  Surgeon: Jerrell Belfast, MD;  Location: [facility];  Service: ENT;  Laterality: Right;  . MULTIPLE EXTRACTIONS WITH ALVEOLOPLASTY N/A 01/18/2013   Procedure: MULTIPLE EXTRACION 3, 6, 7, 10, 11, 13, 21, 22, 27, 28, 29, 30 WITH ALVEOLOPLASTY;  Surgeon: Gae Bon, DDS;  Location: [facility];  Service: Oral Surgery;  Laterality: N/A;  . SKIN SPLIT GRAFT Right 03/21/2017   Procedure: IRRIGATION AND DEBRIDEMENT RIGHT FOOT AND APPLY SPLIT THICKNESS SKIN GRAFT AND WOUND VAC;  Surgeon: [name], MD;  Location: [facility];  Service: Orthopedics;  Laterality: Right;  . TEE WITHOUT CARDIOVERSION N/A 05/02/2017   Procedure: TRANSESOPHAGEAL ECHOCARDIOGRAM (TEE);  Surgeon: Acie Fredrickson  Wonda Cheng, MD;  [facility];  Service: Cardiovascular;  Laterality: N/A;  coincidental to orthopedic case    Family History  Problem Relation Age of Onset  . Hypertension Mother   . Arthritis Father   . Hypertension Father   . Hypertension Brother   . Cancer Maternal Grandmother 73       unknown type of cancer  . Depression Paternal Grandmother     Social History   Socioeconomic History  . Marital status: Single    Spouse name: Not on file  . Number of children: 3  . Years of education: 52  . Highest education level: Not on file  Occupational History  . Occupation: disabled  Tobacco Use  . Smoking status: Never Smoker  . Smokeless tobacco: Never Used  Vaping Use  . Vaping Use: Never used  Substance and Sexual Activity  . Alcohol use: Not Currently  . Drug use: Not Currently    Comment: OTC ASA  . Sexual activity: Yes    Partners: Female    Comment: given condoms  Other Topics Concern  . Not on file  Social History Narrative   ** Merged History Encounter **       Worked for the city of [place] for 18 years.   Unemployed.    Applying for disability.   Medicaid patient.   Social Determinants of Health   Financial Resource Strain:   . Difficulty of Paying Living Expenses: Not on file  Food Insecurity: Food Insecurity Present  . Worried About Charity fundraiser in the Last Year: Sometimes true  . Ran Out of Food in the Last Year: Sometimes true  Transportation Needs: No Transportation Needs  . Lack of Transportation (Medical): No  . Lack of Transportation (Non-Medical): No  Physical Activity:   . Days of Exercise per Week: Not on file  . Minutes of Exercise per Session: Not on file  Stress:   . Feeling of Stress : Not on file  Social Connections:   . Frequency of Communication with Friends and Family: Not on file  . Frequency of Social Gatherings with Friends and Family: Not on file  . Attends Religious Services: Not on file  . Active Member of Clubs  or Organizations: Not on file  . Attends Archivist Meetings: Not on file  . Marital Status: Not on file  Intimate Partner Violence:   . Fear of Current or Ex-Partner: Not on file  . Emotionally Abused: Not on file  . Physically Abused: Not on file  . Sexually Abused: Not on file      Allergies  Allergen Reactions  . Sulfur Itching    Patient stated he's allergic to "sulfur" AND "sulfa"  . Sulfa Antibiotics Itching    ROS Review of Systems  Constitutional: Negative.   HENT: Negative.   Eyes: Negative.   Respiratory: Negative.   Gastrointestinal: Positive for abdominal distention.  Endocrine: Negative.   Genitourinary: Negative.   Musculoskeletal: Positive for arthralgias (generalized joint pain).       Right BKA wound  Skin: Positive for wound (Right BKA).  Allergic/Immunologic: Negative.   Neurological: Positive for dizziness (occasional ) and headaches (occasional).  Psychiatric/Behavioral: Negative.       Objective:    Physical Exam Vitals and nursing note reviewed.  Constitutional:      Appearance: Normal appearance.  HENT:     Head: Normocephalic and atraumatic.     Nose: Nose normal.     Mouth/Throat:     Mouth: Mucous membranes are moist.     Pharynx: Oropharynx is clear.  Cardiovascular:     Rate and Rhythm: Normal rate and regular rhythm.     Pulses: Normal pulses.     Heart sounds: Normal heart sounds.  Pulmonary:     Effort: Pulmonary effort is normal.     Breath sounds: Normal breath sounds.  Abdominal:     General: Bowel sounds are normal.     Palpations: Abdomen is soft.  Musculoskeletal:        General: Normal range of motion.     Cervical back: Normal range of motion and neck supple.     Comments: Right BKA wound  Skin:    General: Skin is warm and dry.  Neurological:     General: No focal deficit present.     Mental Status: He is alert and oriented to person, place, and time.  Psychiatric:        Mood and Affect: Mood  normal.        Behavior: Behavior normal.        Thought Content: Thought  content normal.        Judgment: Judgment normal.     BP 121/81 (BP Location: Left Arm, Patient Position: Sitting, Cuff Size: Large)   Pulse 89   Temp (!) 97.3 F (36.3 C)   Ht 5\' 10"  (1.778 m)   Wt 289 lb (131.1 kg)   SpO2 100%   BMI 41.47 kg/m  Wt Readings from Last 3 Encounters:  11/29/19 289 lb (131.1 kg)  10/21/19 280 lb (127 kg)  10/05/19 282 lb (127.9 kg)     Health Maintenance Due  Topic Date Due  . COVID-19 Vaccine (1) Never done  . OPHTHALMOLOGY EXAM  06/23/2012  . COLONOSCOPY  Never done  . FOOT EXAM  02/23/2015  . URINE MICROALBUMIN  02/23/2015  . HEMOGLOBIN A1C  04/15/2019  . LIPID PANEL  11/11/2019  . INFLUENZA VACCINE  11/07/2019    There are no preventive care reminders to display for this patient.  Lab Results  Component Value Date   TSH 1.633 11/10/2018   Lab Results  Component Value Date   WBC 6.8 09/01/2019   HGB 10.5 (L) 09/01/2019   HCT 33.0 (L) 09/01/2019   MCV 81.5 09/01/2019   PLT 269 09/01/2019   Lab Results  Component Value Date   NA 135 09/01/2019   K 4.2 09/01/2019   CO2 19 (L) 09/01/2019   GLUCOSE 157 (H) 09/01/2019   BUN 40 (H) 09/01/2019   CREATININE 2.39 (H) 09/01/2019   BILITOT 0.3 09/01/2019   ALKPHOS 80 08/23/2019   AST 24 09/01/2019   ALT 20 09/01/2019   PROT 8.0 09/01/2019   ALBUMIN 3.1 (L) 08/23/2019   CALCIUM 8.8 09/01/2019   ANIONGAP 8 08/23/2019   Lab Results  Component Value Date   CHOL 234 (H) 11/11/2018   Lab Results  Component Value Date   HDL 36 (L) 11/11/2018   Lab Results  Component Value Date   LDLCALC 171 (H) 11/11/2018   Lab Results  Component Value Date   TRIG 134 11/11/2018   Lab Results  Component Value Date   CHOLHDL 6.5 11/11/2018   Lab Results  Component Value Date   HGBA1C 8.1 (A) 01/13/2019      Assessment & Plan:   1. Non-healing wound of amputation stump ([place]) He will keep appointment with  [facility] on 12/23/2019.   2. Hx of BKA, right ([place])  3. Type 2 diabetes mellitus with other specified complication, with long-term current use of insulin (HCC) He will continue medication as prescribed, to decrease foods/beverages high in sugars and carbs and follow Heart Healthy or DASH diet. Increase physical activity to at least 30 minutes cardio exercise daily.   4. Anxiety  5. Follow up He will follow up in 3 months.    No orders of the defined types were placed in this encounter.  No orders of the defined types were placed in this encounter.   Referral Orders  No referral(s) requested today    Kathe Becton,  MSN, FNP-BC [facility] [address] [place], [place] [address] [phone] fax  Problem List Items Addressed This Visit      Other   Hx of BKA, right ([place])    Other Visit Diagnoses    Non-healing wound of amputation stump ([place])    -  Primary   Type 2 diabetes mellitus with other specified complication, with long-term current use of insulin ([place])  Anxiety       Follow up          No orders of the defined types were placed in this encounter.   Follow-up: Return in about 3 months (around 02/29/2020).    [name], FNP

## 2019-11-30 ENCOUNTER — Emergency Department (HOSPITAL_COMMUNITY): Admission: EM | Admit: 2019-11-30 | Discharge: 2019-11-30 | Discharge status: Absent without leave | Payer: Medicare HMO

## 2019-12-01 ENCOUNTER — Ambulatory Visit (INDEPENDENT_AMBULATORY_CARE_PROVIDER_SITE_OTHER): Payer: Medicare HMO | Admitting: Physician Assistant

## 2019-12-01 ENCOUNTER — Encounter: Payer: Self-pay | Admitting: Family

## 2019-12-01 VITALS — Ht 70.0 in | Wt 289.0 lb

## 2019-12-01 DIAGNOSIS — Z89511 Acquired absence of right leg below knee: Secondary | ICD-10-CM | POA: Diagnosis not present

## 2019-12-01 MED ORDER — DOXYCYCLINE HYCLATE 100 MG PO TABS: 100.0000 mg | ORAL_TABLET | Freq: Two times a day (BID) | ORAL | 0 refills | Status: DC

## 2019-12-01 NOTE — Progress Notes (Signed)
Office Visit Note   Patient: Keith [name]           Date of Birth: 1965-11-09           MRN: [id number] Visit Date: 12/01/2019              Requested by: No referring provider defined for this encounter. PCP: System, Pcp Not In  Chief Complaint  Patient presents with  . Right Leg - Follow-up    BKA      HPI: This is a pleasant 54 year old gentleman known to [place].  He is status post below-knee amputation.  He saw [place] several months ago because he had lost significant weight volume and his limb and had developed a painful ulcer at the end of the amputation stump.  This was treated on and off.  He is also gone to the emergency department to have this treated.  He did obtain a new better fitting prosthetic.  He works on his feet and wears his prosthetic quite a bit.  He still continues to have an open area at the end of the stump.  While it is not typically painful he is concerned because it is foul odor and and has a open area.  He is not currently being treated with antibiotics  Assessment & Plan: Visit Diagnoses: No diagnosis found.  Plan: I would like to the patient to get to XL shrinkers.  He is not sure he can afford them at this time.  I told him to check with [employer].  In the meantime I would like for him to do daily dry dressings and wash the area with soap and water.  I have taken him off 2 weeks of work and his employer has ensured that he will still have a job.  I have also given him a 2-week course of doxycycline.  Given the chronicity of this problem, he may need a BKA revision to excise this callused broken down skin area.  We will follow up with [name] in 2 weeks  Follow-Up Instructions: No follow-ups on file.   Ortho Exam  Patient is alert, oriented, no adenopathy, well-dressed, normal affect, normal respiratory effort. Focused examination demonstrates thickened open callus with malodorous drainage.  Does not probe to bone.  No surrounding cellulitis.  No significant  pain  Imaging: No results found. No images are attached to the encounter.  Labs: Lab Results  Component Value Date   HGBA1C 8.1 (A) 01/13/2019   HGBA1C >15.5 (H) 09/07/2018   HGBA1C 15.0 (H) 08/07/2018   ESRSEDRATE 130 (H) 11/02/2018   ESRSEDRATE 140 (H) 10/30/2018   ESRSEDRATE 74 (H) 06/06/2018   CRP <0.8 11/02/2018   CRP 2.0 (H) 10/30/2018   CRP <0.8 06/06/2018   LABURIC 6.3 08/07/2009   REPTSTATUS 11/08/2018 FINAL 11/03/2018   REPTSTATUS 11/08/2018 FINAL 11/03/2018   GRAMSTAIN  04/29/2017    NO WBC SEEN RARE GRAM POSITIVE COCCI IN PAIRS IN SINGLES    CULT  11/03/2018    NO GROWTH 5 DAYS Performed at [facility], [place] [address], [place], [place] [address]    CULT  11/03/2018    NO GROWTH 5 DAYS Performed at [facility] [address], [place], [place] [address]    LABORGA METHICILLIN RESISTANT STAPHYLOCOCCUS AUREUS 04/29/2017     Lab Results  Component Value Date   ALBUMIN 3.1 (L) 08/23/2019   ALBUMIN 3.6 02/07/2019   ALBUMIN 2.8 (L) 11/10/2018   PREALBUMIN 25.5 06/06/2018  PREALBUMIN 7.4 (L) 03/15/2017   LABURIC 6.3 08/07/2009    Lab Results  Component Value Date   MG 2.2 08/08/2018   MG 1.7 04/28/2017   MG 2.0 01/08/2017   Lab Results  Component Value Date   VD25OH 8.2 (L) 09/07/2018    Lab Results  Component Value Date   PREALBUMIN 25.5 06/06/2018   PREALBUMIN 7.4 (L) 03/15/2017   CBC EXTENDED Latest Ref Rng & Units 09/01/2019 08/23/2019 02/07/2019  WBC 3.8 - 10.8 Thousand/uL 6.8 11.7(H) 5.4  RBC 4.20 - 5.80 Million/uL 4.05(L) 4.26 4.29  HGB 13.2 - 17.1 g/dL 10.5(L) 11.2(L) 11.2(L)  HCT 38 - 50 % 33.0(L) 36.1(L) 36.0(L)  PLT 140 - 400 Thousand/uL 269 244 239  NEUTROABS 1.7 - 7.7 K/uL - 6.6 2.3  LYMPHSABS 0.7 - 4.0 K/uL - 4.0 2.4     Body mass index is 41.47 kg/m.  Orders:  No orders of the defined types were placed in this encounter.  Meds ordered this encounter  Medications  . doxycycline (VIBRA-TABS) 100 MG tablet     Sig: Take 1 tablet (100 mg total) by mouth 2 (two) times daily.    Dispense:  60 tablet    Refill:  0     Procedures: No procedures performed  Clinical Data: No additional findings.  ROS:  All other systems negative, except as noted in the HPI. Review of Systems  Objective: Vital Signs: Ht 5\' 10"  (1.778 m)   Wt 289 lb (131.1 kg)   BMI 41.47 kg/m   Specialty Comments:  No specialty comments available.  PMFS History: Patient Active Problem List   Diagnosis Date Noted  . Uncontrolled type 2 diabetes mellitus with hyperglycemia ([place]) 01/14/2019  . Elevated glucose 01/14/2019  . Hemoglobin A1C between 7% and 9% indicating borderline diabetic control 01/14/2019  . Hx of BKA, right ([place]) 01/14/2019  . Pressure injury of skin 11/11/2018  . Acute myocardial infarction ([place]) 11/10/2018  . CKD (chronic kidney disease), stage II 11/10/2018  . Amputation stump infection ([place]) 10/28/2018  . Type II diabetes mellitus with renal manifestations ([place]) 08/07/2018  . Uncontrolled type 2 diabetes mellitus with hyperosmolar nonketotic hyperglycemia ([place]) 08/07/2018  . Non-pressure chronic ulcer of right calf limited to breakdown of skin ([place]) 07/06/2018  . Type 2 diabetes mellitus without complication, without long-term current use of insulin ([place]) 06/15/2018  . Chronic low back pain without sciatica 06/15/2018  . Idiopathic chronic venous hypertension of left lower extremity with ulcer and inflammation ([place])   . Venous stasis ulcer of left calf limited to breakdown of skin without varicose veins ([place]) 04/23/2018  . Acquired absence of right leg below knee ([place]) 06/13/2017  . Acute renal failure superimposed on stage 3 chronic kidney disease ([place]) 03/15/2017  . Diabetic polyneuropathy associated with type 2 diabetes mellitus ([place]) 01/23/2017  . AKI (acute kidney injury) ([place]) 09/28/2016  . Nausea vomiting and diarrhea 09/28/2016  . Onychomycosis of multiple toenails with type 2 diabetes  mellitus ([place]) 08/29/2015  . MRSA carrier 04/20/2014  . Penile wart 02/22/2014  . HIV disease ([place])   . Insulin-requiring or dependent type II diabetes mellitus ([place]) 02/04/2014  . Dental anomaly 11/20/2012  . Arthritis of right knee 02/23/2012  . Hyperlipidemia 11/10/2011  . Chronic pain 08/07/2011  . Meralgia paraesthetica 04/23/2011  . ERECTILE DYSFUNCTION 08/22/2008  . Essential hypertension 05/19/2008   Past Medical History:  Diagnosis Date  . Abscess of right foot    abscess/ulcer of R transtibial amputation requiring IV  abx  . AIDS ([place])   . Chronic anemia   . Chronic knee pain    right  . Chronic pain   . CKD (chronic kidney disease), stage II   . Diabetes type 2, uncontrolled ([place])    HgA1c 17.6 (04/27/2010)  . Diabetic foot ulcer ([place]) 01/2017   right foot  . Erectile dysfunction   . Genital warts   . HIV (human immunodeficiency virus infection) ([place]) 2009   CD4 count 100, VL 13800 (05/01/2010)  . Hypertension   . Osteomyelitis ([place])    h/o hand  . Osteomyelitis of metatarsal ([place]) 04/28/2017    Family History  Problem Relation Age of Onset  . Hypertension Mother   . Arthritis Father   . Hypertension Father   . Hypertension Brother   . Cancer Maternal Grandmother 79       unknown type of cancer  . Depression Paternal Grandmother     Past Surgical History:  Procedure Laterality Date  . AMPUTATION Right 05/02/2017   Procedure: AMPUTATION TRANSMETARSAL;  Surgeon: [name], MD;  Location: [facility];  Service: Orthopedics;  Laterality: Right;  . AMPUTATION Right 06/13/2017   Procedure: RIGHT BELOW KNEE AMPUTATION;  Surgeon: [name], MD;  Location: [facility];  Service: Orthopedics;  Laterality: Right;  . BELOW KNEE LEG AMPUTATION Right 06/13/2017  . BELOW KNEE LEG AMPUTATION    . CORONARY STENT INTERVENTION N/A 11/10/2018   Procedure: CORONARY STENT INTERVENTION;  Surgeon: Leonie Man, MD;  Location: [facility];  Service: Cardiovascular;   Laterality: N/A;  . HERNIA REPAIR    . I & D EXTREMITY Left 08/21/2014   Procedure: INCISION AND DRAINAGE LEFT SMALL FINGER;  Surgeon: Leanora Cover, MD;  Location: [facility];  Service: Orthopedics;  Laterality: Left;  . I & D EXTREMITY Right 03/18/2017   Procedure: IRRIGATION AND DEBRIDEMENT EXTREMITY;  Surgeon: [name], MD;  Location: [facility];  Service: Orthopedics;  Laterality: Right;  . LEFT HEART CATH AND CORONARY ANGIOGRAPHY N/A 11/10/2018   Procedure: LEFT HEART CATH AND CORONARY ANGIOGRAPHY;  Surgeon: Leonie Man, MD;  Location: [facility];  Service: Cardiovascular;  Laterality: N/A;  . MINOR IRRIGATION AND DEBRIDEMENT OF WOUND Right 04/22/2014   Procedure: IRRIGATION AND DEBRIDEMENT OF RIGHT NECK ABCESS;  Surgeon: Jerrell Belfast, MD;  Location: [facility];  Service: ENT;  Laterality: Right;  . MULTIPLE EXTRACTIONS WITH ALVEOLOPLASTY N/A 01/18/2013   Procedure: MULTIPLE EXTRACION 3, 6, 7, 10, 11, 13, 21, 22, 27, 28, 29, 30 WITH ALVEOLOPLASTY;  Surgeon: Gae Bon, DDS;  Location: [facility];  Service: Oral Surgery;  Laterality: N/A;  . SKIN SPLIT GRAFT Right 03/21/2017   Procedure: IRRIGATION AND DEBRIDEMENT RIGHT FOOT AND APPLY SPLIT THICKNESS SKIN GRAFT AND WOUND VAC;  Surgeon: [name], MD;  Location: [facility];  Service: Orthopedics;  Laterality: Right;  . TEE WITHOUT CARDIOVERSION N/A 05/02/2017   Procedure: TRANSESOPHAGEAL ECHOCARDIOGRAM (TEE);  Surgeon: Acie Fredrickson Wonda Cheng, MD;  Location[facility];  Service: Cardiovascular;  Laterality: N/A;  coincidental to orthopedic case   Social History   Occupational History  . Occupation: disabled  Tobacco Use  . Smoking status: Never Smoker  . Smokeless tobacco: Never Used  Vaping Use  . Vaping Use: Never used  Substance and Sexual Activity  . Alcohol use: Not Currently  . Drug use: Not Currently    Comment: OTC ASA  . Sexual activity: Yes    Partners: Female  Comment: given condoms

## 2019-12-17 DIAGNOSIS — Z79899 Other long term (current) drug therapy: Secondary | ICD-10-CM | POA: Diagnosis not present

## 2019-12-17 DIAGNOSIS — T8789 Other complications of amputation stump: Secondary | ICD-10-CM | POA: Diagnosis not present

## 2019-12-17 DIAGNOSIS — L97909 Non-pressure chronic ulcer of unspecified part of unspecified lower leg with unspecified severity: Secondary | ICD-10-CM | POA: Diagnosis not present

## 2019-12-17 DIAGNOSIS — G546 Phantom limb syndrome with pain: Secondary | ICD-10-CM | POA: Diagnosis not present

## 2019-12-17 DIAGNOSIS — G894 Chronic pain syndrome: Secondary | ICD-10-CM | POA: Diagnosis not present

## 2019-12-21 ENCOUNTER — Encounter: Payer: Self-pay | Admitting: Orthopedic Surgery

## 2019-12-21 ENCOUNTER — Ambulatory Visit (INDEPENDENT_AMBULATORY_CARE_PROVIDER_SITE_OTHER): Payer: Medicare HMO | Admitting: Physician Assistant

## 2019-12-21 ENCOUNTER — Other Ambulatory Visit: Payer: Self-pay | Admitting: Family Medicine

## 2019-12-21 VITALS — Ht 70.0 in | Wt 289.0 lb

## 2019-12-21 DIAGNOSIS — Z89511 Acquired absence of right leg below knee: Secondary | ICD-10-CM

## 2019-12-21 DIAGNOSIS — Z794 Long term (current) use of insulin: Secondary | ICD-10-CM

## 2019-12-21 DIAGNOSIS — E1169 Type 2 diabetes mellitus with other specified complication: Secondary | ICD-10-CM

## 2019-12-21 MED ORDER — ATORVASTATIN CALCIUM 80 MG PO TABS: 80.0000 mg | ORAL_TABLET | Freq: Every day | ORAL | 3 refills | Status: DC

## 2019-12-21 NOTE — Progress Notes (Signed)
Office Visit Note   Patient: Keith [name]           Date of Birth: [birth year]           MRN: [id number] Visit Date: 12/21/2019              Requested by: No referring provider defined for this encounter. PCP: System, Pcp Not In  Chief Complaint  Patient presents with  . Right Leg - Follow-up    06/13/17 right BKA       HPI: This is a pleasant gentleman who is status post right below-knee amputation in 2019.  He recently had a significant change in his volume and was rubbing in the prosthetic he had.  This created an area of breakdown of the skin.  He saw me a m ago and I took him off work and asked that he not use his leg he feels he is doing much better he admits he does get quite a bit of sweating underneath the liner  Assessment & Plan: Visit Diagnoses: No diagnosis found.  Plan: He will pick up to shrinkers and should change these twice a day they should go underneath his liner and I talked to him about how to do this.  Follow-up in 1 month.  Follow-Up Instructions: No follow-ups on file.   Ortho Exam  Patient is alert, oriented, no adenopathy, well-dressed, normal affect, normal respiratory effort. End of amputation stump no cellulitis no drainage.  He does have an area of macerated skin.  This measures about 5 cm in diameter in the central portion it is cracked and is has a healthy wound bed beneath.  I offered to debride this but he does want to go back to work  Imaging: No results found. No images are attached to the encounter.  Labs: Lab Results  Component Value Date   HGBA1C 8.1 (A) 01/13/2019   HGBA1C >15.5 (H) 09/07/2018   HGBA1C 15.0 (H) 08/07/2018   ESRSEDRATE 130 (H) 11/02/2018   ESRSEDRATE 140 (H) 10/30/2018   ESRSEDRATE 74 (H) 06/06/2018   CRP <0.8 11/02/2018   CRP 2.0 (H) 10/30/2018   CRP <0.8 06/06/2018   LABURIC 6.3 08/07/2009   REPTSTATUS 11/08/2018 FINAL 11/03/2018   REPTSTATUS 11/08/2018 FINAL 11/03/2018   GRAMSTAIN  04/29/2017    NO WBC  SEEN RARE GRAM POSITIVE COCCI IN PAIRS IN SINGLES    CULT  11/03/2018    NO GROWTH 5 DAYS Performed at [facility], [place] [address] [place], [place] [address]    CULT  11/03/2018    NO GROWTH 5 DAYS Performed at [facility] [address] , [place] [address]    LABORGA METHICILLIN RESISTANT STAPHYLOCOCCUS AUREUS 04/29/2017     Lab Results  Component Value Date   ALBUMIN 3.1 (L) 08/23/2019   ALBUMIN 3.6 02/07/2019   ALBUMIN 2.8 (L) 11/10/2018   PREALBUMIN 25.5 06/06/2018   PREALBUMIN 7.4 (L) 03/15/2017   LABURIC 6.3 08/07/2009    Lab Results  Component Value Date   MG 2.2 08/08/2018   MG 1.7 04/28/2017   MG 2.0 01/08/2017   Lab Results  Component Value Date   VD25OH 8.2 (L) 09/07/2018    Lab Results  Component Value Date   PREALBUMIN 25.5 06/06/2018   PREALBUMIN 7.4 (L) 03/15/2017   CBC EXTENDED Latest Ref Rng & Units 09/01/2019 08/23/2019 02/07/2019  WBC 3.8 - 10.8 Thousand/uL 6.8 11.7(H) 5.4  RBC 4.20 - 5.80 Million/uL 4.05(L) 4.26 4.29  HGB 13.2 - 17.1 g/dL 10.5(L) 11.2(L) 11.2(L)  HCT 38 - 50 % 33.0(L) 36.1(L) 36.0(L)  PLT 140 - 400 Thousand/uL 269 244 239  NEUTROABS 1.7 - 7.7 K/uL - 6.6 2.3  LYMPHSABS 0.7 - 4.0 K/uL - 4.0 2.4     Body mass index is 41.47 kg/m.  Orders:  No orders of the defined types were placed in this encounter.  No orders of the defined types were placed in this encounter.    Procedures: No procedures performed  Clinical Data: No additional findings.  ROS:  All other systems negative, except as noted in the HPI. Review of Systems  Objective: Vital Signs: Ht 5\' 10"  (1.778 m)   Wt 289 lb (131.1 kg)   BMI 41.47 kg/m   Specialty Comments:  No specialty comments available.  PMFS History: Patient Active Problem List   Diagnosis Date Noted  . Uncontrolled type 2 diabetes mellitus with hyperglycemia ([place]) 01/14/2019  . Elevated glucose 01/14/2019  . Hemoglobin A1C between 7% and 9% indicating  borderline diabetic control 01/14/2019  . Hx of BKA, right ([place]) 01/14/2019  . Pressure injury of skin 11/11/2018  . Acute myocardial infarction ([place]) 11/10/2018  . CKD (chronic kidney disease), stage II 11/10/2018  . Amputation stump infection ([place]) 10/28/2018  . Type II diabetes mellitus with renal manifestations ([place]) 08/07/2018  . Uncontrolled type 2 diabetes mellitus with hyperosmolar nonketotic hyperglycemia ([place]) 08/07/2018  . Non-pressure chronic ulcer of right calf limited to breakdown of skin ([place]) 07/06/2018  . Type 2 diabetes mellitus without complication, without long-term current use of insulin ([place]) 06/15/2018  . Chronic low back pain without sciatica 06/15/2018  . Idiopathic chronic venous hypertension of left lower extremity with ulcer and inflammation ([place])   . Venous stasis ulcer of left calf limited to breakdown of skin without varicose veins ([place]) 04/23/2018  . Acquired absence of right leg below knee ([place]) 06/13/2017  . Acute renal failure superimposed on stage 3 chronic kidney disease ([place]) 03/15/2017  . Diabetic polyneuropathy associated with type 2 diabetes mellitus ([place]) 01/23/2017  . AKI (acute kidney injury) ([place]) 09/28/2016  . Nausea vomiting and diarrhea 09/28/2016  . Onychomycosis of multiple toenails with type 2 diabetes mellitus ([place]) 08/29/2015  . MRSA carrier 04/20/2014  . Penile wart 02/22/2014  . HIV disease ([place])   . Insulin-requiring or dependent type II diabetes mellitus ([place]) 02/04/2014  . Dental anomaly 11/20/2012  . Arthritis of right knee 02/23/2012  . Hyperlipidemia 11/10/2011  . Chronic pain 08/07/2011  . Meralgia paraesthetica 04/23/2011  . ERECTILE DYSFUNCTION 08/22/2008  . Essential hypertension 05/19/2008   Past Medical History:  Diagnosis Date  . Abscess of right foot    abscess/ulcer of R transtibial amputation requiring IV abx  . AIDS ([place])   . Chronic anemia   . Chronic knee pain    right  . Chronic pain   . CKD (chronic  kidney disease), stage II   . Diabetes type 2, uncontrolled ([place])    HgA1c 17.6 (04/27/2010)  . Diabetic foot ulcer ([place]) 01/2017   right foot  . Erectile dysfunction   . Genital warts   . HIV (human immunodeficiency virus infection) ([place]) 2009   CD4 count 100, VL 13800 (05/01/2010)  . Hypertension   . Osteomyelitis ([place])    h/o hand  . Osteomyelitis of metatarsal ([place]) 04/28/2017    Family History  Problem Relation Age of Onset  . Hypertension Mother   . Arthritis Father   . Hypertension Father   .  Hypertension Brother   . Cancer Maternal Grandmother 19       unknown type of cancer  . Depression Paternal Grandmother     Past Surgical History:  Procedure Laterality Date  . AMPUTATION Right 05/02/2017   Procedure: AMPUTATION TRANSMETARSAL;  Surgeon: [name], MD;  Location: [facility];  Service: Orthopedics;  Laterality: Right;  . AMPUTATION Right 06/13/2017   Procedure: RIGHT BELOW KNEE AMPUTATION;  Surgeon: [name], MD;  Location: [facility];  Service: Orthopedics;  Laterality: Right;  . BELOW KNEE LEG AMPUTATION Right 06/13/2017  . BELOW KNEE LEG AMPUTATION    . CORONARY STENT INTERVENTION N/A 11/10/2018   Procedure: CORONARY STENT INTERVENTION;  Surgeon: Leonie Man, MD;  Location: [facility];  Service: Cardiovascular;  Laterality: N/A;  . HERNIA REPAIR    . I & D EXTREMITY Left 08/21/2014   Procedure: INCISION AND DRAINAGE LEFT SMALL FINGER;  Surgeon: Leanora Cover, MD;  Location: [facility];  Service: Orthopedics;  Laterality: Left;  . I & D EXTREMITY Right 03/18/2017   Procedure: IRRIGATION AND DEBRIDEMENT EXTREMITY;  Surgeon: [name], MD;  Location: [facility];  Service: Orthopedics;  Laterality: Right;  . LEFT HEART CATH AND CORONARY ANGIOGRAPHY N/A 11/10/2018   Procedure: LEFT HEART CATH AND CORONARY ANGIOGRAPHY;  Surgeon: Leonie Man, MD;  Location: [facility];  Service: Cardiovascular;  Laterality: N/A;  . MINOR IRRIGATION AND DEBRIDEMENT OF WOUND  Right 04/22/2014   Procedure: IRRIGATION AND DEBRIDEMENT OF RIGHT NECK ABCESS;  Surgeon: Jerrell Belfast, MD;  Location: [facility];  Service: ENT;  Laterality: Right;  . MULTIPLE EXTRACTIONS WITH ALVEOLOPLASTY N/A 01/18/2013   Procedure: MULTIPLE EXTRACION 3, 6, 7, 10, 11, 13, 21, 22, 27, 28, 29, 30 WITH ALVEOLOPLASTY;  Surgeon: Gae Bon, DDS;  Location: [facility];  Service: Oral Surgery;  Laterality: N/A;  . SKIN SPLIT GRAFT Right 03/21/2017   Procedure: IRRIGATION AND DEBRIDEMENT RIGHT FOOT AND APPLY SPLIT THICKNESS SKIN GRAFT AND WOUND VAC;  Surgeon: [name], MD;  Location: [facility];  Service: Orthopedics;  Laterality: Right;  . TEE WITHOUT CARDIOVERSION N/A 05/02/2017   Procedure: TRANSESOPHAGEAL ECHOCARDIOGRAM (TEE);  Surgeon: Acie Fredrickson Wonda Cheng, MD;  Location[facility];  Service: Cardiovascular;  Laterality: N/A;  coincidental to orthopedic case   Social History   Occupational History  . Occupation: disabled  Tobacco Use  . Smoking status: Never Smoker  . Smokeless tobacco: Never Used  Vaping Use  . Vaping Use: Never used  Substance and Sexual Activity  . Alcohol use: Not Currently  . Drug use: Not Currently    Comment: OTC ASA  . Sexual activity: Yes    Partners: Female    Comment: given condoms

## 2019-12-22 ENCOUNTER — Telehealth: Payer: Self-pay | Admitting: Physician Assistant

## 2019-12-22 ENCOUNTER — Other Ambulatory Visit: Payer: Self-pay

## 2019-12-22 ENCOUNTER — Other Ambulatory Visit: Payer: Self-pay | Admitting: Family Medicine

## 2019-12-22 DIAGNOSIS — R7309 Other abnormal glucose: Secondary | ICD-10-CM

## 2019-12-22 DIAGNOSIS — E1165 Type 2 diabetes mellitus with hyperglycemia: Secondary | ICD-10-CM

## 2019-12-22 DIAGNOSIS — E1169 Type 2 diabetes mellitus with other specified complication: Secondary | ICD-10-CM

## 2019-12-22 NOTE — Telephone Encounter (Signed)
Patient states he lost the return to work note that was given to him on yesterday.There fore he could not return today.  He will like pickup a new note today around 3:00pm, with the return to work date for tomorrow.

## 2019-12-22 NOTE — Telephone Encounter (Signed)
Called and sw pt to advise that note is at the front desk for pick up today.

## 2019-12-23 ENCOUNTER — Encounter (HOSPITAL_BASED_OUTPATIENT_CLINIC_OR_DEPARTMENT_OTHER): Payer: Medicare HMO | Admitting: Internal Medicine

## 2019-12-24 ENCOUNTER — Other Ambulatory Visit: Payer: Self-pay | Admitting: Family Medicine

## 2019-12-24 DIAGNOSIS — E1165 Type 2 diabetes mellitus with hyperglycemia: Secondary | ICD-10-CM

## 2019-12-24 MED ORDER — LEVEMIR 100 UNIT/ML ~~LOC~~ SOLN: 20.0000 [IU] | Freq: Every day | SUBCUTANEOUS | 11 refills | Status: DC

## 2020-01-11 DIAGNOSIS — G894 Chronic pain syndrome: Secondary | ICD-10-CM | POA: Diagnosis not present

## 2020-01-12 ENCOUNTER — Other Ambulatory Visit: Payer: Self-pay | Admitting: Infectious Diseases

## 2020-01-12 DIAGNOSIS — B20 Human immunodeficiency virus [HIV] disease: Secondary | ICD-10-CM

## 2020-01-18 ENCOUNTER — Ambulatory Visit: Payer: Medicare HMO | Admitting: Orthopedic Surgery

## 2020-02-08 DIAGNOSIS — G894 Chronic pain syndrome: Secondary | ICD-10-CM | POA: Diagnosis not present

## 2020-02-08 DIAGNOSIS — Z79899 Other long term (current) drug therapy: Secondary | ICD-10-CM | POA: Diagnosis not present

## 2020-02-16 ENCOUNTER — Other Ambulatory Visit: Payer: Self-pay | Admitting: Family Medicine

## 2020-02-16 ENCOUNTER — Telehealth: Payer: Self-pay | Admitting: Family Medicine

## 2020-02-16 DIAGNOSIS — R7309 Other abnormal glucose: Secondary | ICD-10-CM

## 2020-02-16 DIAGNOSIS — E1165 Type 2 diabetes mellitus with hyperglycemia: Secondary | ICD-10-CM

## 2020-02-16 DIAGNOSIS — E1169 Type 2 diabetes mellitus with other specified complication: Secondary | ICD-10-CM

## 2020-02-16 DIAGNOSIS — E119 Type 2 diabetes mellitus without complications: Secondary | ICD-10-CM

## 2020-02-16 DIAGNOSIS — Z794 Long term (current) use of insulin: Secondary | ICD-10-CM

## 2020-02-16 MED ORDER — ONETOUCH ULTRASOFT LANCETS MISC: 12 refills | Status: AC

## 2020-02-16 MED ORDER — LEVEMIR 100 UNIT/ML ~~LOC~~ SOLN: 20.0000 [IU] | Freq: Every day | SUBCUTANEOUS | 3 refills | Status: DC

## 2020-02-16 MED ORDER — COOL BLOOD GLUCOSE TEST STRIPS VI STRP: ORAL_STRIP | 0 refills | Status: DC

## 2020-02-16 MED ORDER — INSULIN SYRINGES (DISPOSABLE) U-100 0.3 ML MISC: 1.0000 | Freq: Three times a day (TID) | 11 refills | Status: DC

## 2020-02-16 MED ORDER — INSULIN LISPRO 100 UNIT/ML ~~LOC~~ SOLN: SUBCUTANEOUS | 3 refills | Status: DC

## 2020-02-16 MED ORDER — NOVOLOG FLEXPEN 100 UNIT/ML ~~LOC~~ SOPN: PEN_INJECTOR | SUBCUTANEOUS | 11 refills | Status: DC

## 2020-02-16 NOTE — Telephone Encounter (Signed)
Pt call for refill on his meds Humalog I tried to explain to Pt he has 2 different Pharmancys and his humalog was at [facility] and his needled and other insulin was at [facility] did not want to listen and keep over talking me and hung  Up Pt was told by his provider not to just come to office requesting to speak to her but to use Mychart or make a appointment but Pt continues to call ask and ask to speak with [facility]

## 2020-02-21 NOTE — Telephone Encounter (Signed)
Sentt to provider

## 2020-02-22 ENCOUNTER — Telehealth: Payer: Self-pay | Admitting: Family Medicine

## 2020-02-23 IMAGING — DX DG KNEE COMPLETE 4+V*R*
4 series · 4 of 4 positions shown · non-contrast
Comparison: 03/04/2016.

CLINICAL DATA: Fall.  Right BKA.

EXAM:
RIGHT KNEE - COMPLETE 4+ VIEW

[knee ap]
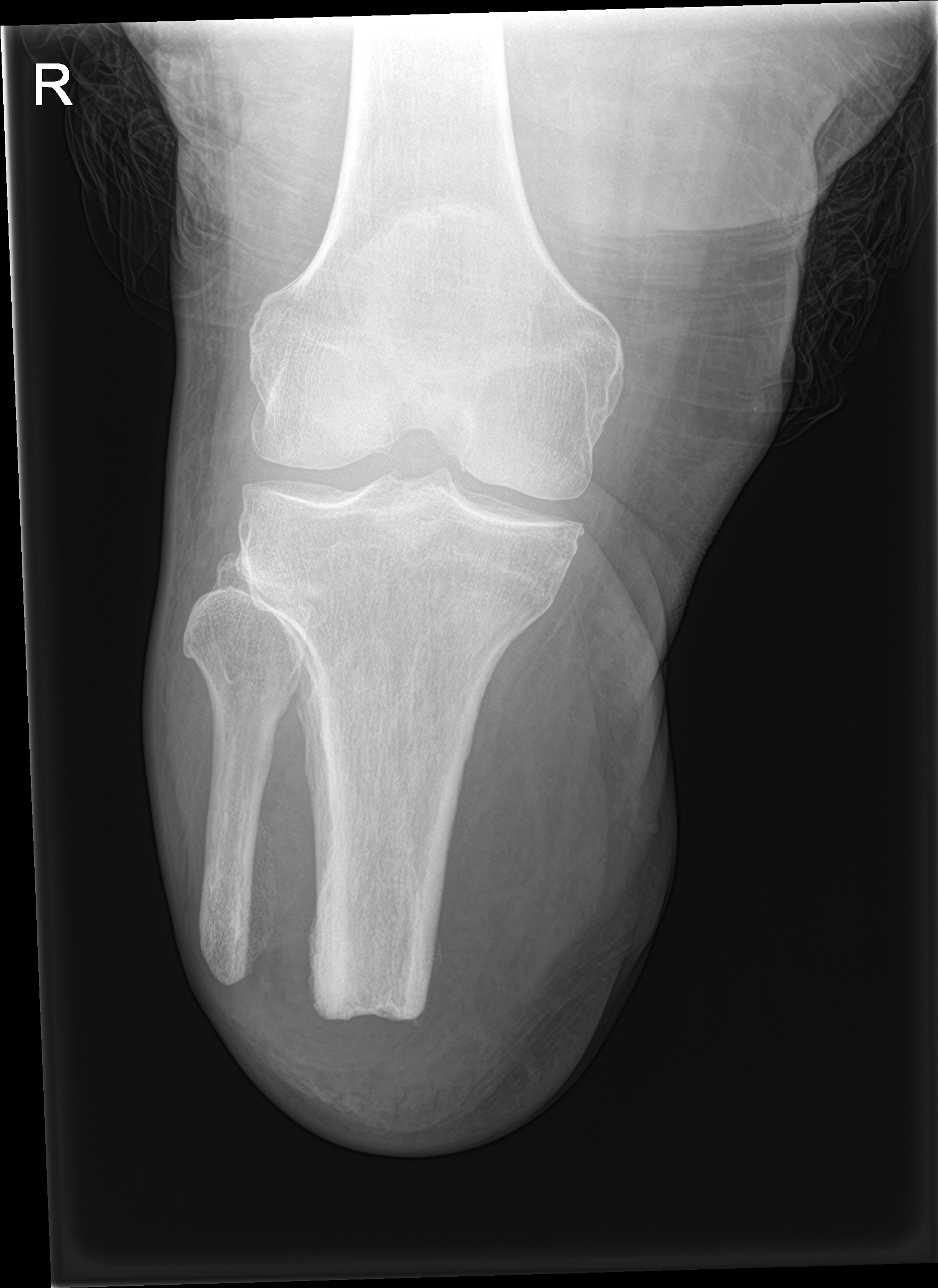

[knee lat]
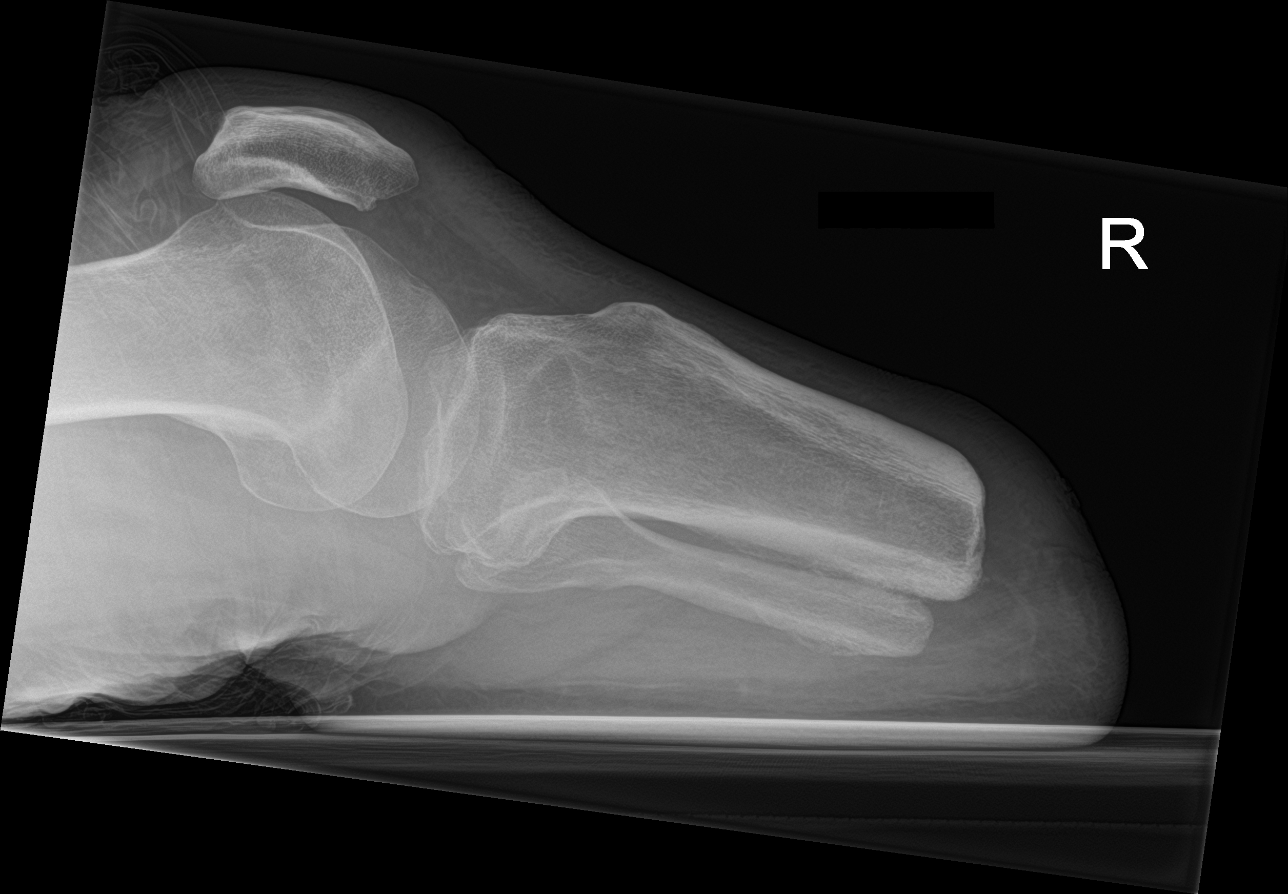

[knee obl (1 of 2)]
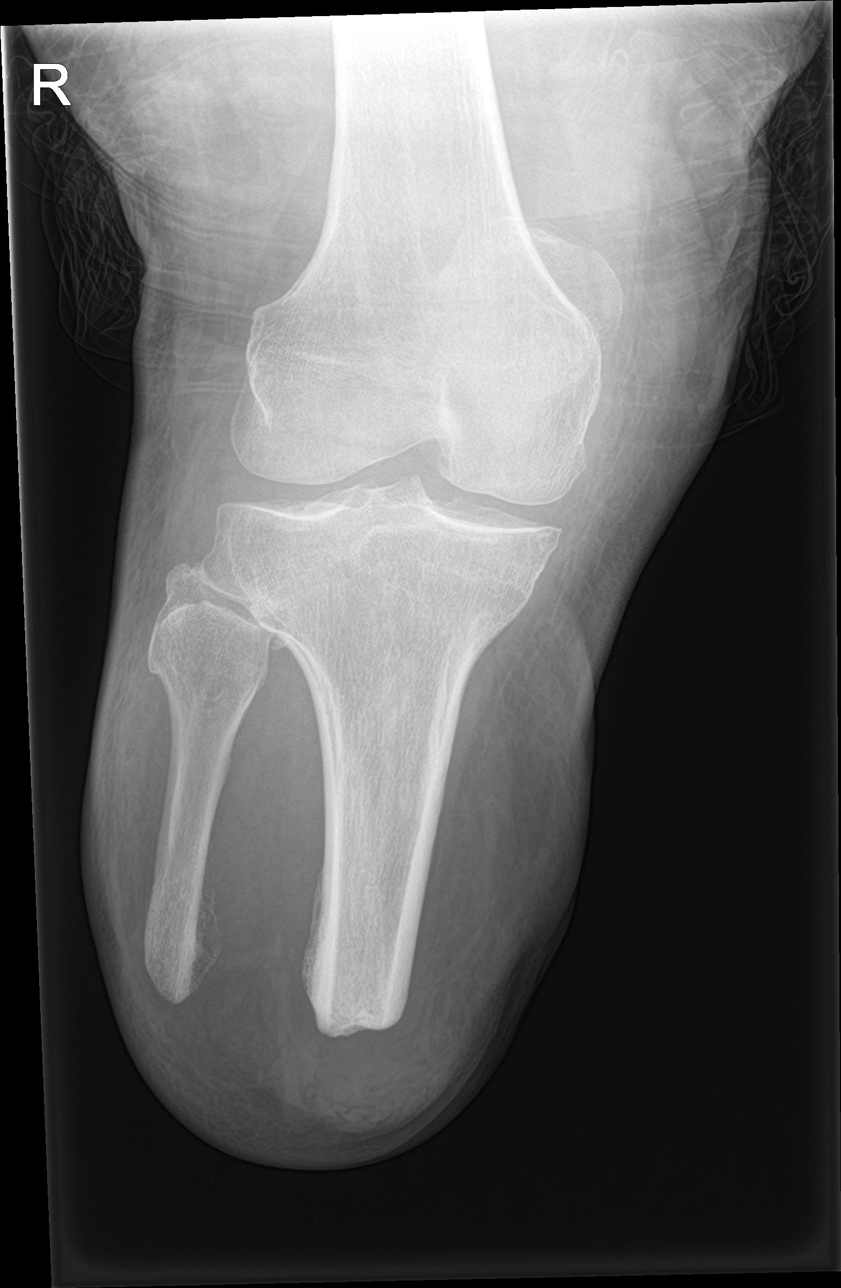

[knee obl (2 of 2)]
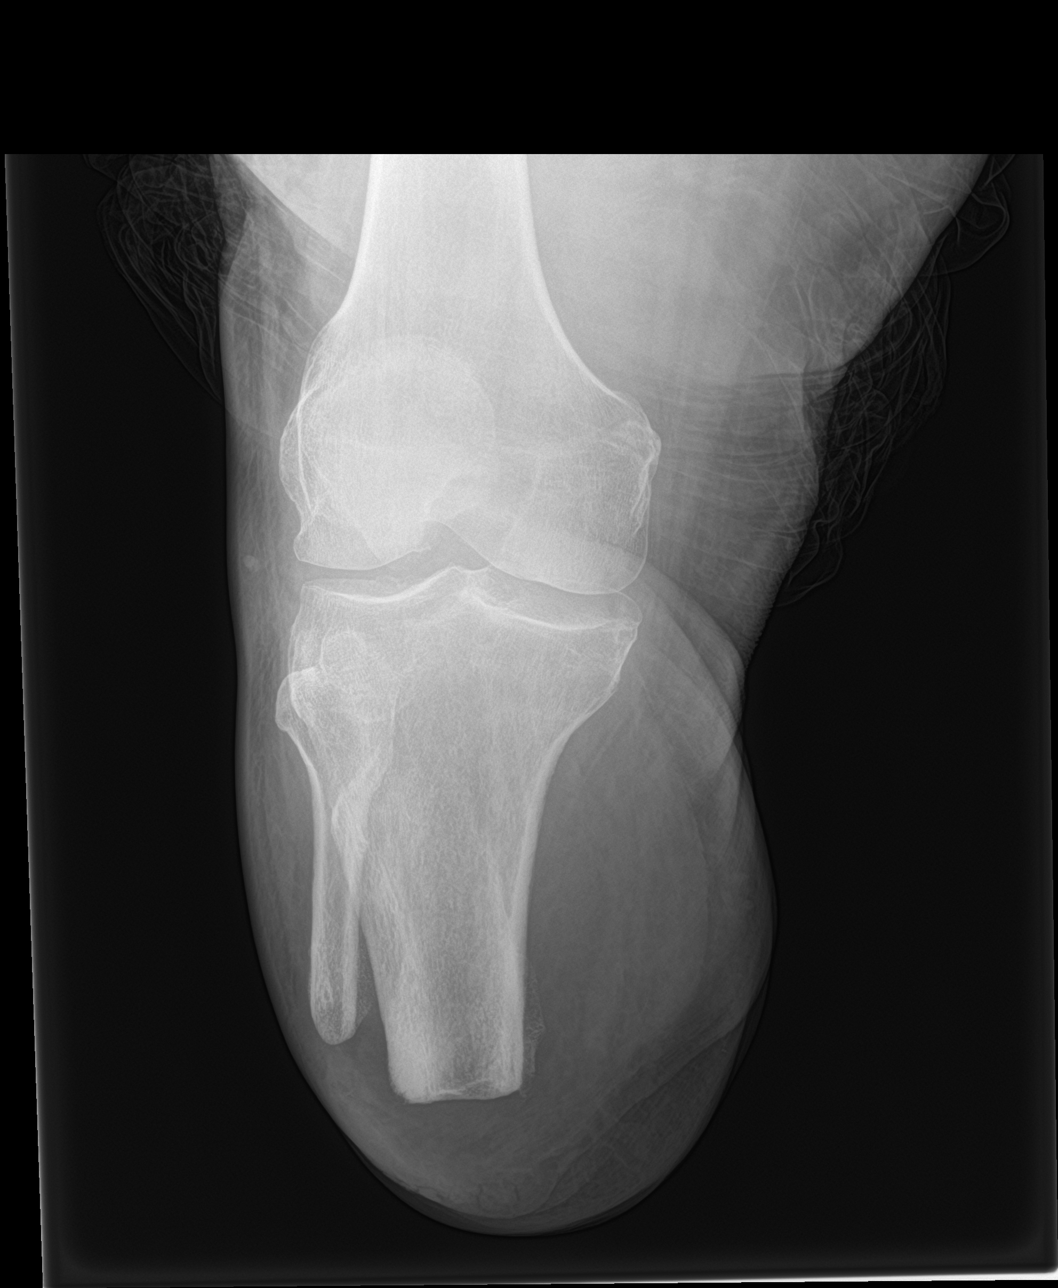

[4 of 4 positions shown; findings below may reference images not displayed]

FINDINGS: BKA. Mild tricompartment degenerative change. No acute bony
abnormality identified. No evidence of fracture or dislocation. No
evidence of effusion.
IMPRESSION: BKA.  Mild degenerative change.  No acute abnormality identified.

## 2020-02-23 NOTE — Telephone Encounter (Signed)
Sent to provider 

## 2020-02-24 ENCOUNTER — Ambulatory Visit: Payer: Medicare HMO | Admitting: Infectious Diseases

## 2020-02-29 ENCOUNTER — Ambulatory Visit: Payer: Medicare HMO | Admitting: Family Medicine

## 2020-03-08 DIAGNOSIS — G546 Phantom limb syndrome with pain: Secondary | ICD-10-CM | POA: Diagnosis not present

## 2020-03-08 DIAGNOSIS — M129 Arthropathy, unspecified: Secondary | ICD-10-CM | POA: Diagnosis not present

## 2020-03-08 DIAGNOSIS — Z79899 Other long term (current) drug therapy: Secondary | ICD-10-CM | POA: Diagnosis not present

## 2020-03-08 DIAGNOSIS — M545 Low back pain, unspecified: Secondary | ICD-10-CM | POA: Diagnosis not present

## 2020-03-14 ENCOUNTER — Ambulatory Visit: Payer: Medicare HMO | Admitting: Family Medicine

## 2020-03-14 DIAGNOSIS — G894 Chronic pain syndrome: Secondary | ICD-10-CM | POA: Diagnosis not present

## 2020-03-14 DIAGNOSIS — G546 Phantom limb syndrome with pain: Secondary | ICD-10-CM | POA: Diagnosis not present

## 2020-03-20 ENCOUNTER — Other Ambulatory Visit: Payer: Self-pay | Admitting: Infectious Diseases

## 2020-03-20 DIAGNOSIS — B20 Human immunodeficiency virus [HIV] disease: Secondary | ICD-10-CM

## 2020-03-21 ENCOUNTER — Emergency Department (HOSPITAL_COMMUNITY): Payer: Medicare HMO

## 2020-03-21 ENCOUNTER — Inpatient Hospital Stay (HOSPITAL_COMMUNITY)
Admission: EM | Admit: 2020-03-21 | Discharge: 2020-03-28 | DRG: 247 | Disposition: A | Discharge status: Home / Self Care | Payer: Medicare HMO | Attending: Internal Medicine | Admitting: Internal Medicine

## 2020-03-21 ENCOUNTER — Inpatient Hospital Stay (HOSPITAL_COMMUNITY)
Admission: EM | Disposition: A | Discharge status: Home / Self Care | Payer: Self-pay | Source: Home / Self Care | Attending: Cardiovascular Disease

## 2020-03-21 ENCOUNTER — Encounter (HOSPITAL_COMMUNITY): Payer: Self-pay | Admitting: Emergency Medicine

## 2020-03-21 DIAGNOSIS — Z9114 Patient's other noncompliance with medication regimen: Secondary | ICD-10-CM

## 2020-03-21 DIAGNOSIS — E875 Hyperkalemia: Secondary | ICD-10-CM | POA: Diagnosis present

## 2020-03-21 DIAGNOSIS — J9811 Atelectasis: Secondary | ICD-10-CM | POA: Diagnosis not present

## 2020-03-21 DIAGNOSIS — G8929 Other chronic pain: Secondary | ICD-10-CM | POA: Diagnosis present

## 2020-03-21 DIAGNOSIS — E1165 Type 2 diabetes mellitus with hyperglycemia: Secondary | ICD-10-CM | POA: Diagnosis present

## 2020-03-21 DIAGNOSIS — M79604 Pain in right leg: Secondary | ICD-10-CM | POA: Diagnosis not present

## 2020-03-21 DIAGNOSIS — Z7984 Long term (current) use of oral hypoglycemic drugs: Secondary | ICD-10-CM

## 2020-03-21 DIAGNOSIS — I517 Cardiomegaly: Secondary | ICD-10-CM | POA: Diagnosis not present

## 2020-03-21 DIAGNOSIS — I10 Essential (primary) hypertension: Secondary | ICD-10-CM | POA: Diagnosis present

## 2020-03-21 DIAGNOSIS — T508X5A Adverse effect of diagnostic agents, initial encounter: Secondary | ICD-10-CM | POA: Diagnosis not present

## 2020-03-21 DIAGNOSIS — Z20822 Contact with and (suspected) exposure to covid-19: Secondary | ICD-10-CM | POA: Diagnosis present

## 2020-03-21 DIAGNOSIS — I252 Old myocardial infarction: Secondary | ICD-10-CM

## 2020-03-21 DIAGNOSIS — G546 Phantom limb syndrome with pain: Secondary | ICD-10-CM | POA: Diagnosis not present

## 2020-03-21 DIAGNOSIS — E1151 Type 2 diabetes mellitus with diabetic peripheral angiopathy without gangrene: Secondary | ICD-10-CM | POA: Diagnosis present

## 2020-03-21 DIAGNOSIS — T383X6A Underdosing of insulin and oral hypoglycemic [antidiabetic] drugs, initial encounter: Secondary | ICD-10-CM | POA: Diagnosis present

## 2020-03-21 DIAGNOSIS — Z89511 Acquired absence of right leg below knee: Secondary | ICD-10-CM

## 2020-03-21 DIAGNOSIS — E1122 Type 2 diabetes mellitus with diabetic chronic kidney disease: Secondary | ICD-10-CM | POA: Diagnosis present

## 2020-03-21 DIAGNOSIS — E1140 Type 2 diabetes mellitus with diabetic neuropathy, unspecified: Secondary | ICD-10-CM | POA: Diagnosis present

## 2020-03-21 DIAGNOSIS — I129 Hypertensive chronic kidney disease with stage 1 through stage 4 chronic kidney disease, or unspecified chronic kidney disease: Secondary | ICD-10-CM | POA: Diagnosis present

## 2020-03-21 DIAGNOSIS — Z794 Long term (current) use of insulin: Secondary | ICD-10-CM

## 2020-03-21 DIAGNOSIS — L89892 Pressure ulcer of other site, stage 2: Secondary | ICD-10-CM | POA: Diagnosis present

## 2020-03-21 DIAGNOSIS — D631 Anemia in chronic kidney disease: Secondary | ICD-10-CM | POA: Diagnosis present

## 2020-03-21 DIAGNOSIS — Z6839 Body mass index (BMI) 39.0-39.9, adult: Secondary | ICD-10-CM

## 2020-03-21 DIAGNOSIS — E871 Hypo-osmolality and hyponatremia: Secondary | ICD-10-CM | POA: Diagnosis present

## 2020-03-21 DIAGNOSIS — Z7982 Long term (current) use of aspirin: Secondary | ICD-10-CM

## 2020-03-21 DIAGNOSIS — I2119 ST elevation (STEMI) myocardial infarction involving other coronary artery of inferior wall: Principal | ICD-10-CM | POA: Diagnosis present

## 2020-03-21 DIAGNOSIS — R739 Hyperglycemia, unspecified: Secondary | ICD-10-CM | POA: Diagnosis not present

## 2020-03-21 DIAGNOSIS — I2121 ST elevation (STEMI) myocardial infarction involving left circumflex coronary artery: Secondary | ICD-10-CM | POA: Diagnosis present

## 2020-03-21 DIAGNOSIS — I213 ST elevation (STEMI) myocardial infarction of unspecified site: Secondary | ICD-10-CM | POA: Diagnosis present

## 2020-03-21 DIAGNOSIS — Z8249 Family history of ischemic heart disease and other diseases of the circulatory system: Secondary | ICD-10-CM

## 2020-03-21 DIAGNOSIS — N179 Acute kidney failure, unspecified: Secondary | ICD-10-CM | POA: Diagnosis not present

## 2020-03-21 DIAGNOSIS — Z7902 Long term (current) use of antithrombotics/antiplatelets: Secondary | ICD-10-CM

## 2020-03-21 DIAGNOSIS — N184 Chronic kidney disease, stage 4 (severe): Secondary | ICD-10-CM | POA: Diagnosis present

## 2020-03-21 DIAGNOSIS — E1169 Type 2 diabetes mellitus with other specified complication: Secondary | ICD-10-CM

## 2020-03-21 DIAGNOSIS — E669 Obesity, unspecified: Secondary | ICD-10-CM | POA: Diagnosis present

## 2020-03-21 DIAGNOSIS — E785 Hyperlipidemia, unspecified: Secondary | ICD-10-CM | POA: Diagnosis present

## 2020-03-21 DIAGNOSIS — Z882 Allergy status to sulfonamides status: Secondary | ICD-10-CM

## 2020-03-21 DIAGNOSIS — R350 Frequency of micturition: Secondary | ICD-10-CM

## 2020-03-21 DIAGNOSIS — I2510 Atherosclerotic heart disease of native coronary artery without angina pectoris: Secondary | ICD-10-CM | POA: Diagnosis present

## 2020-03-21 DIAGNOSIS — B20 Human immunodeficiency virus [HIV] disease: Secondary | ICD-10-CM | POA: Diagnosis present

## 2020-03-21 DIAGNOSIS — Z79899 Other long term (current) drug therapy: Secondary | ICD-10-CM

## 2020-03-21 DIAGNOSIS — N141 Nephropathy induced by other drugs, medicaments and biological substances: Secondary | ICD-10-CM | POA: Diagnosis not present

## 2020-03-21 DIAGNOSIS — Z955 Presence of coronary angioplasty implant and graft: Secondary | ICD-10-CM

## 2020-03-21 DIAGNOSIS — Z22322 Carrier or suspected carrier of Methicillin resistant Staphylococcus aureus: Secondary | ICD-10-CM

## 2020-03-21 HISTORY — PX: CORONARY/GRAFT ACUTE MI REVASCULARIZATION: CATH118305

## 2020-03-21 HISTORY — PX: LEFT HEART CATH AND CORONARY ANGIOGRAPHY: CATH118249

## 2020-03-21 LAB — PROTIME-INR
INR: 1 (ref 0.8–1.2)
Prothrombin Time: 12.7 seconds (ref 11.4–15.2)

## 2020-03-21 LAB — I-STAT CHEM 8, ED
BUN: 36 mg/dL — ABNORMAL HIGH (ref 6–20)
Calcium, Ion: 1.2 mmol/L (ref 1.15–1.40)
Chloride: 88 mmol/L — ABNORMAL LOW (ref 98–111)
Creatinine, Ser: 2.1 mg/dL — ABNORMAL HIGH (ref 0.61–1.24)
Glucose, Bld: >700 mg/dL (ref 70–99)
HCT: 44 % (ref 39.0–52.0)
Hemoglobin: 15 g/dL (ref 13.0–17.0)
Potassium: 5.5 mmol/L — ABNORMAL HIGH (ref 3.5–5.1)
Sodium: 122 mmol/L — ABNORMAL LOW (ref 135–145)
TCO2: 26 mmol/L (ref 22–32)

## 2020-03-21 LAB — URINALYSIS, ROUTINE W REFLEX MICROSCOPIC
Bilirubin Urine: NEGATIVE
Glucose, UA: >=500 mg/dL — AB
Hgb urine dipstick: NEGATIVE
Ketones, ur: NEGATIVE mg/dL
Leukocytes,Ua: NEGATIVE
Nitrite: NEGATIVE
Protein, ur: 100 mg/dL — AB
Specific Gravity, Urine: 1.022 (ref 1.005–1.030)
pH: 6 (ref 5.0–8.0)

## 2020-03-21 LAB — BASIC METABOLIC PANEL
Anion gap: 14 (ref 5–15)
BUN: 29 mg/dL — ABNORMAL HIGH (ref 6–20)
CO2: 22 mmol/L (ref 22–32)
Calcium: 9.4 mg/dL (ref 8.9–10.3)
Chloride: 83 mmol/L — ABNORMAL LOW (ref 98–111)
Creatinine, Ser: 2.29 mg/dL — ABNORMAL HIGH (ref 0.61–1.24)
GFR, Estimated: 33 mL/min — ABNORMAL LOW (ref >60)
Glucose, Bld: 1027 mg/dL (ref 70–99)
Potassium: 5.5 mmol/L — ABNORMAL HIGH (ref 3.5–5.1)
Sodium: 119 mmol/L — CL (ref 135–145)

## 2020-03-21 LAB — CBC
HCT: 38.2 % — ABNORMAL LOW (ref 39.0–52.0)
Hemoglobin: 12.5 g/dL — ABNORMAL LOW (ref 13.0–17.0)
MCH: 27.3 pg (ref 26.0–34.0)
MCHC: 32.7 g/dL (ref 30.0–36.0)
MCV: 83.4 fL (ref 80.0–100.0)
Platelets: 244 10*3/uL (ref 150–400)
RBC: 4.58 MIL/uL (ref 4.22–5.81)
RDW: 12.6 % (ref 11.5–15.5)
WBC: 6.1 10*3/uL (ref 4.0–10.5)
nRBC: 0 % (ref 0.0–0.2)

## 2020-03-21 LAB — BETA-HYDROXYBUTYRIC ACID: Beta-Hydroxybutyric Acid: 0.47 mmol/L — ABNORMAL HIGH (ref 0.05–0.27)

## 2020-03-21 LAB — CBG MONITORING, ED
Glucose-Capillary: >600 mg/dL (ref 70–99)
Glucose-Capillary: >600 mg/dL (ref 70–99)
Glucose-Capillary: >600 mg/dL (ref 70–99)
Glucose-Capillary: >600 mg/dL (ref 70–99)

## 2020-03-21 LAB — APTT: aPTT: 26 seconds (ref 24–36)

## 2020-03-21 SURGERY — CORONARY/GRAFT ACUTE MI REVASCULARIZATION
Anesthesia: LOCAL

## 2020-03-21 MED ORDER — ASPIRIN 81 MG PO CHEW
324.0000 mg | CHEWABLE_TABLET | Freq: Once | ORAL | Status: AC
Start: 2020-03-21 — End: 2020-03-21
  Administered 2020-03-21: 324 mg via ORAL
  Filled 2020-03-21: qty 4

## 2020-03-21 MED ORDER — LIDOCAINE HCL (PF) 1 % IJ SOLN
INTRAMUSCULAR | Status: AC
  Filled 2020-03-21: qty 30

## 2020-03-21 MED ORDER — DEXTROSE IN LACTATED RINGERS 5 % IV SOLN
INTRAVENOUS | Status: DC
Start: 2020-03-21 — End: 2020-03-22

## 2020-03-21 MED ORDER — DEXTROSE 50 % IV SOLN: 0.0000 mL | INTRAVENOUS | Status: DC | PRN

## 2020-03-21 MED ORDER — ASPIRIN 81 MG PO CHEW: 324.0000 mg | CHEWABLE_TABLET | Freq: Once | ORAL | Status: DC

## 2020-03-21 MED ORDER — LACTATED RINGERS IV SOLN: INTRAVENOUS | Status: DC

## 2020-03-21 MED ORDER — HEPARIN SODIUM (PORCINE) 1000 UNIT/ML IJ SOLN
INTRAMUSCULAR | Status: AC
  Filled 2020-03-21: qty 1

## 2020-03-21 MED ORDER — INSULIN REGULAR(HUMAN) IN NACL 100-0.9 UT/100ML-% IV SOLN
INTRAVENOUS | Status: DC
  Administered 2020-03-21: 6 [IU]/h via INTRAVENOUS
  Filled 2020-03-21: qty 100

## 2020-03-21 MED ORDER — VERAPAMIL HCL 2.5 MG/ML IV SOLN
INTRAVENOUS | Status: AC
  Filled 2020-03-21: qty 2

## 2020-03-21 MED ORDER — SODIUM CHLORIDE 0.9 % IV SOLN: INTRAVENOUS | Status: DC

## 2020-03-21 MED ORDER — HEPARIN (PORCINE) IN NACL 1000-0.9 UT/500ML-% IV SOLN
INTRAVENOUS | Status: AC
  Filled 2020-03-21: qty 1500

## 2020-03-21 MED ORDER — IOHEXOL 350 MG/ML SOLN
INTRAVENOUS | Status: AC
  Filled 2020-03-21: qty 1

## 2020-03-21 MED ORDER — HEPARIN SODIUM (PORCINE) 5000 UNIT/ML IJ SOLN
4000.0000 [IU] | Freq: Once | INTRAMUSCULAR | Status: AC
Start: 2020-03-21 — End: 2020-03-21
  Administered 2020-03-21: 4000 [IU] via INTRAVENOUS
  Filled 2020-03-21: qty 1

## 2020-03-21 SURGICAL SUPPLY — 20 items
BALLN SAPPHIRE 2.0X12 (BALLOONS) ×2
BALLOON SAPPHIRE 2.0X12 (BALLOONS) IMPLANT
CATH INFINITI 5FR AL1 (CATHETERS) ×1 IMPLANT
CATH INFINITI 5FR MULTPACK ANG (CATHETERS) ×1 IMPLANT
CATH VISTA GUIDE 6FR XB4 (CATHETERS) ×1 IMPLANT
GLIDESHEATH SLEND A-KIT 6F 22G (SHEATH) ×1 IMPLANT
GUIDEWIRE INQWIRE 1.5J.035X260 (WIRE) IMPLANT
INQWIRE 1.5J .035X260CM (WIRE) ×2
KIT ENCORE 26 ADVANTAGE (KITS) ×1 IMPLANT
KIT HEART LEFT (KITS) ×2 IMPLANT
KIT HEMO VALVE WATCHDOG (MISCELLANEOUS) ×1 IMPLANT
PACK CARDIAC CATHETERIZATION (CUSTOM PROCEDURE TRAY) ×2 IMPLANT
SHEATH PINNACLE 6F 10CM (SHEATH) ×1 IMPLANT
STENT RESOLUTE ONYX 2.25X12 (Permanent Stent) ×1 IMPLANT
SYR MEDRAD MARK 7 150ML (SYRINGE) ×2 IMPLANT
TRANSDUCER W/STOPCOCK (MISCELLANEOUS) ×2 IMPLANT
TUBING CIL FLEX 10 FLL-RA (TUBING) ×2 IMPLANT
WIRE ASAHI PROWATER 180CM (WIRE) ×1 IMPLANT
WIRE EMERALD 3MM-J .035X150CM (WIRE) ×1 IMPLANT
WIRE HI TORQ VERSACORE-J 145CM (WIRE) ×1 IMPLANT

## 2020-03-21 NOTE — ED Provider Notes (Signed)
Medical screening examination/treatment/procedure(s) were conducted as a shared visit with non-physician practitioner(s) and myself.  I personally evaluated the patient during the encounter.  Pt presented for hyperglycemia.  Pt has been getting treatment for his uncontrolled hyperglycemia.  Plan was for admission.  Pt's admission EKG suggests STEMI.  Pt is denying any chest pain.  Will add on troponin.  Activate code stemi as it sounds like he had a silent MI in the past.     EKG Interpretation  Date/Time:  Tuesday March 21 2020 22:27:31 EST Ventricular Rate:  81 PR Interval:    QRS Duration: 116 QT Interval:  356 QTC Calculation: 414 R Axis:   -29 Text Interpretation: Sinus rhythm Nonspecific intraventricular conduction delay Inferior infarct, acute (LCx) Probable anterolateral infarct, acute >>> Acute MI <<< mi new since last tracing Confirmed by Dorie Rank [phone]) on 03/21/2020 10:32:23 PM     Dorie Rank, MD 03/21/20 2241

## 2020-03-21 NOTE — ED Triage Notes (Signed)
Pt reports out of insulin x1 week, endorses polyuria and polydipsia, unsure of what blood sugar has been running, normally around 110 when he has his insulin.

## 2020-03-21 NOTE — H&P (Signed)
Cardiology Admission History and Physical:   [name]; MRN: [id number]; DOB: 1966/01/31   Admission date: 03/21/2020  Primary Care Provider: [name], FNP Primary Cardiologist: Larae Grooms, MD    Chief Complaint:  Nausea, polyuria, polydypsia  History of Present Illness:   Keith [name] is a 54 y.o. male with a history of uncontrolled type 2 DM, CAD (with stenting of OM1 11/2018), HIV, right BKA, CKD, chronic back pain, diabetic neuropathy, onychomycosis, hyperlipidemia, and hypertension, who presented to the hospital with multiple symptoms after running out of his insulin 1 week ago. He has had nausea, vomiting, blurry vision, abdominal pain, diarrhea and polyuria. He has been consuming large amounts of fluids. For some reason he also drank "[facility]" since his friend advised him to take it. He himself is a poor historian.  He had a cardiac catheterization procedure in August 2020 when a stent was placed in the OM1 branch. He was also documented to have severe diffuse disease (90%) in the distal LAD. He does not recall taking his ASA recently. He also gives vague answers regarding his other medications.  During the current hospitalization, in the ED, the vital signs were: 141/117 mmHg, HR 90 bpm. The labs were:  NA 122, K 5.5, Gluc >700, BUN 36, Cr 2.1, Hgb 15, and Hct 44.  The ECG showed acute ST elevations in the inferior and anterolateral leads.      Past Medical History:  Diagnosis Date  . Abscess of right foot    abscess/ulcer of R transtibial amputation requiring IV abx  . AIDS ([place])   . Chronic anemia   . Chronic knee pain    right  . Chronic pain   . CKD (chronic kidney disease), stage II   . Diabetes type 2, uncontrolled ([place])    HgA1c 17.6 (04/27/2010)  . Diabetic foot ulcer ([place]) 01/2017   right foot  . Erectile dysfunction   . Genital warts   . HIV (human immunodeficiency virus infection) ([place]) 2009   CD4 count 100, VL  13800 (05/01/2010)  . Hypertension   . Osteomyelitis ([place])    h/o hand  . Osteomyelitis of metatarsal ([place]) 04/28/2017    Past Surgical History:  Procedure Laterality Date  . AMPUTATION Right 05/02/2017   Procedure: AMPUTATION TRANSMETARSAL;  Surgeon: [name], MD;  Location: [place];  Service: Orthopedics;  Laterality: Right;  . AMPUTATION Right 06/13/2017   Procedure: RIGHT BELOW KNEE AMPUTATION;  Surgeon: [name], MD;  Location: [place];  Service: Orthopedics;  Laterality: Right;  . BELOW KNEE LEG AMPUTATION Right 06/13/2017  . BELOW KNEE LEG AMPUTATION    . CORONARY STENT INTERVENTION N/A 11/10/2018   Procedure: CORONARY STENT INTERVENTION;  Surgeon: Leonie Man, MD;  Location: [place] [facility];  Service: Cardiovascular;  Laterality: N/A;  . HERNIA REPAIR    . I & D EXTREMITY Left 08/21/2014   Procedure: INCISION AND DRAINAGE LEFT SMALL FINGER;  Surgeon: Leanora Cover, MD;  Location: [place];  Service: Orthopedics;  Laterality: Left;  . I & D EXTREMITY Right 03/18/2017   Procedure: IRRIGATION AND DEBRIDEMENT EXTREMITY;  Surgeon: [name], MD;  Location: [place];  Service: Orthopedics;  Laterality: Right;  . LEFT HEART CATH AND CORONARY ANGIOGRAPHY N/A 11/10/2018   Procedure: LEFT HEART CATH AND CORONARY ANGIOGRAPHY;  Surgeon: Leonie Man, MD;  Location: [place] [facility];  Service: Cardiovascular;  Laterality: N/A;  . MINOR IRRIGATION AND DEBRIDEMENT OF  WOUND Right 04/22/2014   Procedure: IRRIGATION AND DEBRIDEMENT OF RIGHT NECK ABCESS;  Surgeon: Jerrell Belfast, MD;  Location: [place];  Service: ENT;  Laterality: Right;  . MULTIPLE EXTRACTIONS WITH ALVEOLOPLASTY N/A 01/18/2013   Procedure: MULTIPLE EXTRACION 3, 6, 7, 10, 11, 13, 21, 22, 27, 28, 29, 30 WITH ALVEOLOPLASTY;  Surgeon: Gae Bon, DDS;  Location: [place];  Service: Oral Surgery;  Laterality: N/A;  . SKIN SPLIT GRAFT Right 03/21/2017   Procedure: IRRIGATION AND DEBRIDEMENT RIGHT FOOT AND APPLY SPLIT THICKNESS  SKIN GRAFT AND WOUND VAC;  Surgeon: [name], MD;  Location: [place];  Service: Orthopedics;  Laterality: Right;  . TEE WITHOUT CARDIOVERSION N/A 05/02/2017   Procedure: TRANSESOPHAGEAL ECHOCARDIOGRAM (TEE);  Surgeon: Acie Fredrickson Wonda Cheng, MD;  Location[facility];  Service: Cardiovascular;  Laterality: N/A;  coincidental to orthopedic case     Medications Prior to Admission: Prior to Admission medications   Medication Sig Start Date End Date Taking? Authorizing Provider  amLODipine (NORVASC) 10 MG tablet Take 1 tablet (10 mg total) by mouth daily. 02/07/19   Jacqlyn Larsen, PA-C  aspirin EC 81 MG EC tablet Take 1 tablet (81 mg total) by mouth daily. 11/13/18   Cheryln Manly, NP  atorvastatin (LIPITOR) 80 MG tablet Take 1 tablet (80 mg total) by mouth daily at 6 PM. 12/21/19   [name], FNP  benzonatate (TESSALON) 200 MG capsule Take 1 capsule (200 mg total) by mouth 2 (two) times daily as needed for cough. 11/04/19   Raylene Everts, MD  BIKTARVY 50-200-25 MG TABS tablet TAKE 1 TABLET BY MOUTH DAILY. 03/20/20   [name], MD  blood glucose meter kit and supplies Dispense based on patient and insurance preference. Use up to four times daily as directed. (FOR ICD-10 E10.9, E11.9). Patient taking differently: 1 each by Other route See admin instructions. Dispense based on patient and insurance preference. Use up to four times daily as directed. (FOR ICD-10 E10.9, E11.9). 01/13/19   [name], FNP  carvedilol (COREG) 25 MG tablet Take 1 tablet (25 mg total) by mouth 2 (two) times daily with a meal. Please make appt for future refills. [phone]. 1st attempt. 03/22/19   Cheryln Manly, NP  collagenase (SANTYL) ointment Apply topically 2 (two) times daily at 10 AM and 5 PM. 11/03/18   Kayleen Memos, DO  doxycycline (VIBRA-TABS) 100 MG tablet Take 1 tablet (100 mg total) by mouth 2 (two) times daily. 12/01/19   Persons, Bevely Palmer, PA  EASY COMFORT PEN NEEDLES 31G X 5 MM MISC  INJECT 20 UNITS DAILY AS DIRECTED. 11/10/19   [name], FNP  escitalopram (LEXAPRO) 20 MG tablet Take 30 mg by mouth daily. Taking 1 & 1/2 tablet(s) daily 07/15/18   [provider]  glucose blood (COOL BLOOD GLUCOSE TEST STRIPS) test strip Use as instructed 02/16/20   [name], FNP  insulin aspart (NOVOLOG FLEXPEN) 100 UNIT/ML FlexPen Inject 10 units under skin 3 times a day, with meals, for blood glucose levels greater than 125. 02/16/20   [name], FNP  Insulin Syringes, Disposable, U-100 0.3 ML MISC 1 Syringe by Does not apply route 3 (three) times daily. 02/16/20   [name], FNP  Lancets [facility] ULTRASOFT) lancets Use as instructed 02/16/20   [name], FNP  LEVEMIR 100 UNIT/ML injection Inject 0.2 mLs (20 Units total) into the skin daily. 02/16/20 10/08/21  [name], FNP  loperamide (IMODIUM A-D) 2 MG tablet Take 1 tablet (2 mg total) by mouth 4 (four) times daily as needed for diarrhea or loose stools. 01/13/19   [name], FNP  metFORMIN (GLUCOPHAGE) 1000 MG tablet Take 1,000 mg by mouth 2 (two) times daily. 03/21/19   [provider]  nitroGLYCERIN (NITROSTAT) 0.4 MG SL tablet Place 1 tablet (0.4 mg total) under the tongue every 5 (five) minutes x 3 doses as needed for chest pain. 11/13/18   Cheryln Manly, NP  oxybutynin (DITROPAN) 5 MG tablet TAKE 1 TABLET (5 MG TOTAL) BY MOUTH 2 (TWO) TIMES DAILY. 10/19/18   [name], FNP  oxyCODONE 10 MG TABS Take 1 tablet (10 mg total) by mouth daily as needed for up to 5 doses for severe pain or breakthrough pain (pain). 11/03/18   Kayleen Memos, DO  silver sulfADIAZINE (SILVADENE) 1 % cream Apply 1 application topically daily. Apply to wound daily 11/06/18   [name], MD  ticagrelor (BRILINTA) 90 MG TABS tablet Take 1 tablet (90 mg total) by mouth every 12 (twelve) hours. 11/13/18   Cheryln Manly, NP     Allergies:    Allergies  Allergen Reactions  . Sulfur  Itching    Patient stated he's allergic to "sulfur" AND "sulfa"  . Sulfa Antibiotics Itching    Social History:   Social History   Socioeconomic History  . Marital status: Single    Spouse name: Not on file  . Number of children: 3  . Years of education: 43  . Highest education level: Not on file  Occupational History  . Occupation: disabled  Tobacco Use  . Smoking status: Never Smoker  . Smokeless tobacco: Never Used  Vaping Use  . Vaping Use: Never used  Substance and Sexual Activity  . Alcohol use: Not Currently  . Drug use: Not Currently    Comment: OTC ASA  . Sexual activity: Yes    Partners: Female    Comment: given condoms  Other Topics Concern  . Not on file  Social History Narrative   ** Merged History Encounter **       Worked for the city of [place] for 18 years.   Unemployed.    Applying for disability.   Medicaid patient.   Social Determinants of Health   Financial Resource Strain: Not on file  Food Insecurity: Not on file  Transportation Needs: Not on file  Physical Activity: Not on file  Stress: Not on file  Social Connections: Not on file  Intimate Partner Violence: Not on file     Family History:  The patient's family history includes Arthritis in his father; Cancer (age of onset: 69) in his maternal grandmother; Depression in his paternal grandmother; Hypertension in his brother, father, and mother.     Review of Systems: [y] = yes, _0  = no   . General: Weight gain _1 ; Weight loss _2 ; Anorexia _3 ; Fatigue _4 ; Fever _5 ; Chills _6 ; Weakness [Y]  . Cardiac: Chest pain/pressure _7 ; Resting SOB _8 ; Exertional SOB _9 ; Orthopnea _10 ; Pedal Edema _11 ; Palpitations _12 ; Syncope _13 ; Presyncope _14 ; Paroxysmal nocturnal dyspnea_15   . Pulmonary: Cough _16 ; Wheezing_17 ; Hemoptysis_18 ; Sputum _19 ; Snoring _20   . GI: Vomiting[Y]; Dysphagia_21 ; Melena_22 ; Hematochezia _23 ; Heartburn_24 ; Abdominal pain _25 ; Constipation _26 ; Diarrhea _27 ; BRBPR  _28   .  GU: Hematuria_0 ; Dysuria _1 ; Nocturia_2  Polyuria [Y] . Vascular: Pain in legs with walking _3 ; Pain in feet with lying flat _4 ; Non-healing sores _5 ; Stroke _6 ; TIA _7 ; Slurred speech _8 ;  . Neuro: Headaches_9 ; Vertigo_10 ; Seizures_11 ; Paresthesias_12 ;Blurred vision _13 ; Diplopia _14 ; Vision changes _15   . Ortho/Skin: Arthritis _16 ; Joint pain _17 ; Muscle pain _18 ; Joint swelling _19 ; Back Pain _20 ; Rash _21   . Psych: Depression_22 ; Anxiety_23   . Heme: Bleeding problems _24 ; Clotting disorders _25 ; Anemia _26   . Endocrine: Diabetes [Y]; Thyroid dysfunction_27      Physical Exam/Data:   Vitals:   03/21/20 2200 03/21/20 2215 03/21/20 2230 03/21/20 2245  BP: (!) 149/104 132/90 (!) 156/108 (!) 154/114  Pulse: 81 78 85 89  Resp: _28 Temp:      TempSrc:      SpO2: 96% 97% 97% 99%  Weight:      Height:       No intake or output data in the 24 hours ending 03/21/20 2326 Filed Weights   03/21/20 1401  Weight: (!) 167.8 kg   Body mass index is 53.09 kg/m.  General:  Well nourished, well developed, in no acute distress HEENT: normal Lymph: no adenopathy Neck: no JVD Endocrine:  No thryomegaly Vascular: No carotid bruits; FA pulses 2+ bilaterally without bruits  Cardiac:  normal S1, S2; RRR; no murmur Lungs:  clear to auscultation bilaterally, no wheezing, rhonchi or rales  Abd: soft, nontender, no hepatomegaly  Ext: Right BKA Skin: warm and dry  Neuro:  CNs 2-12 intact, no focal abnormalities noted Psych:  Normal affect    EKG:  The ECG that was done 03/21/2020 was personally reviewed and demonstrates ST elevations in the inferior and anterolateral leads.   Laboratory Data:  Chemistry Recent Labs  Lab 03/21/20 1411 03/21/20 2109  NA 119* 122*  K 5.5* 5.5*  CL 83* 88*  CO2 22  --   GLUCOSE 1,027* >700*  BUN 29* 36*  CREATININE 2.29* 2.10*  CALCIUM 9.4  --   GFRNONAA 33*  --   ANIONGAP 14  --     No results for input(s): PROT, ALBUMIN,  AST, ALT, ALKPHOS, BILITOT in the last 168 hours. Hematology Recent Labs  Lab 03/21/20 1411 03/21/20 2109  WBC 6.1  --   RBC 4.58  --   HGB 12.5* 15.0  HCT 38.2* 44.0  MCV 83.4  --   MCH 27.3  --   MCHC 32.7  --   RDW 12.6  --   PLT 244  --    Cardiac EnzymesNo results for input(s): TROPONINI in the last 168 hours. No results for input(s): TROPIPOC in the last 168 hours.  BNPNo results for input(s): BNP, PROBNP in the last 168 hours.  DDimer No results for input(s): DDIMER in the last 168 hours.  Radiology/Studies:  DG Chest Portable 1 View  Result Date: 03/21/2020 CLINICAL DATA:  Hyperglycemia. STEMI on EKG. EXAM: PORTABLE CHEST 1 VIEW COMPARISON:  01/05/2020 FINDINGS: Stable mild cardiomegaly. Unchanged mediastinal contours with aortic tortuosity. Minor bibasilar atelectasis. No pulmonary edema. No confluent consolidation. No pleural fluid or pneumothorax. No acute osseous abnormalities are seen. IMPRESSION: Stable mild cardiomegaly. Minor bibasilar atelectasis. Electronically Signed   By: Keith Rake M.D.   On: 03/21/2020 23:05    Assessment and Plan:   1. ST Elevation  myocardial infarction  The patient presents to the hospital with complaints of nausea, polydypsia and polyuria. He ran out of his insulin > 7 days ago. His initial blood sugars were >1000. He did not complain of any chest pain. The ECG showed ST elevations in the inferior and anterolateral leads.  - ASA daily - Unfractionated heparin IV infusion - Continue to trend cardiac markers - Serial ECGs - Check a lipid panel - High dose statins - Echo to evaluate LV function  Code STEMI activated by ED attending.   - Recommend cardiac catheterization to define the coronary anatomy. - Cardiac catheterization was discussed with the patient fully. The patient understands that risks include but are not limited to stroke (1 in 1000), death (1 in 22), kidney failure [usually temporary] (1 in 500), bleeding (1 in  200), allergic reaction [possibly serious] (1 in 200).  The patient understands and is willing to proceed.      Severity of Illness: The appropriate patient status for this patient is INPATIENT. Inpatient status is judged to be reasonable and necessary in order to provide the required intensity of service to ensure the patient's safety. The patient's presenting symptoms, physical exam findings, and initial radiographic and laboratory data in the context of their chronic comorbidities is felt to place them at high risk for further clinical deterioration. Furthermore, it is not anticipated that the patient will be medically stable for discharge from the hospital within 2 midnights of admission. The following factors support the patient status of inpatient.   " The patient's presenting symptoms include nausea, polydypsia and polyuria. " The worrisome physical exam findings include NA. " The initial radiographic and laboratory data are worrisome because of abnormal ECG. " The chronic co-morbidities include diabetes mellitus.   * I certify that at the point of admission it is my clinical judgment that the patient will require inpatient hospital care spanning beyond 2 midnights from the point of admission due to high intensity of service, high risk for further deterioration and high frequency of surveillance required.*    For questions or updates, please contact [place] Please consult [email or web address] for contact info under Cardiology/STEMI.    Signed, Meade Maw, MD  03/21/2020 11:26 PM
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
## 2020-03-22 ENCOUNTER — Inpatient Hospital Stay (HOSPITAL_COMMUNITY): Payer: Medicare HMO

## 2020-03-22 ENCOUNTER — Other Ambulatory Visit: Payer: Self-pay

## 2020-03-22 ENCOUNTER — Encounter (HOSPITAL_COMMUNITY): Payer: Self-pay | Admitting: Cardiovascular Disease

## 2020-03-22 DIAGNOSIS — L89892 Pressure ulcer of other site, stage 2: Secondary | ICD-10-CM | POA: Diagnosis present

## 2020-03-22 DIAGNOSIS — B20 Human immunodeficiency virus [HIV] disease: Secondary | ICD-10-CM

## 2020-03-22 DIAGNOSIS — R739 Hyperglycemia, unspecified: Secondary | ICD-10-CM

## 2020-03-22 DIAGNOSIS — Z22322 Carrier or suspected carrier of Methicillin resistant Staphylococcus aureus: Secondary | ICD-10-CM | POA: Diagnosis not present

## 2020-03-22 DIAGNOSIS — E871 Hypo-osmolality and hyponatremia: Secondary | ICD-10-CM | POA: Diagnosis not present

## 2020-03-22 DIAGNOSIS — I2121 ST elevation (STEMI) myocardial infarction involving left circumflex coronary artery: Secondary | ICD-10-CM | POA: Diagnosis present

## 2020-03-22 DIAGNOSIS — E1151 Type 2 diabetes mellitus with diabetic peripheral angiopathy without gangrene: Secondary | ICD-10-CM | POA: Diagnosis present

## 2020-03-22 DIAGNOSIS — Z794 Long term (current) use of insulin: Secondary | ICD-10-CM | POA: Diagnosis not present

## 2020-03-22 DIAGNOSIS — Z20822 Contact with and (suspected) exposure to covid-19: Secondary | ICD-10-CM | POA: Diagnosis not present

## 2020-03-22 DIAGNOSIS — N183 Chronic kidney disease, stage 3 unspecified: Secondary | ICD-10-CM | POA: Diagnosis not present

## 2020-03-22 DIAGNOSIS — N184 Chronic kidney disease, stage 4 (severe): Secondary | ICD-10-CM | POA: Diagnosis not present

## 2020-03-22 DIAGNOSIS — E669 Obesity, unspecified: Secondary | ICD-10-CM | POA: Diagnosis present

## 2020-03-22 DIAGNOSIS — G546 Phantom limb syndrome with pain: Secondary | ICD-10-CM | POA: Diagnosis not present

## 2020-03-22 DIAGNOSIS — E785 Hyperlipidemia, unspecified: Secondary | ICD-10-CM | POA: Diagnosis present

## 2020-03-22 DIAGNOSIS — E1140 Type 2 diabetes mellitus with diabetic neuropathy, unspecified: Secondary | ICD-10-CM | POA: Diagnosis not present

## 2020-03-22 DIAGNOSIS — N141 Nephropathy induced by other drugs, medicaments and biological substances: Secondary | ICD-10-CM | POA: Diagnosis not present

## 2020-03-22 DIAGNOSIS — E1122 Type 2 diabetes mellitus with diabetic chronic kidney disease: Secondary | ICD-10-CM | POA: Diagnosis present

## 2020-03-22 DIAGNOSIS — I2119 ST elevation (STEMI) myocardial infarction involving other coronary artery of inferior wall: Secondary | ICD-10-CM | POA: Diagnosis not present

## 2020-03-22 DIAGNOSIS — I252 Old myocardial infarction: Secondary | ICD-10-CM | POA: Diagnosis not present

## 2020-03-22 DIAGNOSIS — G8929 Other chronic pain: Secondary | ICD-10-CM | POA: Diagnosis present

## 2020-03-22 DIAGNOSIS — M79604 Pain in right leg: Secondary | ICD-10-CM | POA: Diagnosis not present

## 2020-03-22 DIAGNOSIS — I2510 Atherosclerotic heart disease of native coronary artery without angina pectoris: Secondary | ICD-10-CM | POA: Diagnosis not present

## 2020-03-22 DIAGNOSIS — E875 Hyperkalemia: Secondary | ICD-10-CM | POA: Diagnosis present

## 2020-03-22 DIAGNOSIS — I129 Hypertensive chronic kidney disease with stage 1 through stage 4 chronic kidney disease, or unspecified chronic kidney disease: Secondary | ICD-10-CM | POA: Diagnosis not present

## 2020-03-22 DIAGNOSIS — N179 Acute kidney failure, unspecified: Secondary | ICD-10-CM | POA: Diagnosis not present

## 2020-03-22 DIAGNOSIS — E1165 Type 2 diabetes mellitus with hyperglycemia: Secondary | ICD-10-CM | POA: Diagnosis not present

## 2020-03-22 DIAGNOSIS — Z6839 Body mass index (BMI) 39.0-39.9, adult: Secondary | ICD-10-CM | POA: Diagnosis not present

## 2020-03-22 DIAGNOSIS — D631 Anemia in chronic kidney disease: Secondary | ICD-10-CM | POA: Diagnosis present

## 2020-03-22 DIAGNOSIS — R69 Illness, unspecified: Secondary | ICD-10-CM | POA: Diagnosis not present

## 2020-03-22 DIAGNOSIS — E1129 Type 2 diabetes mellitus with other diabetic kidney complication: Secondary | ICD-10-CM | POA: Diagnosis not present

## 2020-03-22 DIAGNOSIS — I213 ST elevation (STEMI) myocardial infarction of unspecified site: Secondary | ICD-10-CM | POA: Diagnosis not present

## 2020-03-22 DIAGNOSIS — I10 Essential (primary) hypertension: Secondary | ICD-10-CM

## 2020-03-22 HISTORY — DX: ST elevation (STEMI) myocardial infarction involving left circumflex coronary artery: I21.21

## 2020-03-22 LAB — LIPID PANEL
Cholesterol: 291 mg/dL — ABNORMAL HIGH (ref 0–200)
HDL: 30 mg/dL — ABNORMAL LOW (ref >40)
LDL Cholesterol: 199 mg/dL — ABNORMAL HIGH (ref 0–99)
Total CHOL/HDL Ratio: 9.7 RATIO
Triglycerides: 310 mg/dL — ABNORMAL HIGH (ref <150)
VLDL: 62 mg/dL — ABNORMAL HIGH (ref 0–40)

## 2020-03-22 LAB — CBC
HCT: 31.7 % — ABNORMAL LOW (ref 39.0–52.0)
Hemoglobin: 11 g/dL — ABNORMAL LOW (ref 13.0–17.0)
MCH: 27 pg (ref 26.0–34.0)
MCHC: 34.7 g/dL (ref 30.0–36.0)
MCV: 77.9 fL — ABNORMAL LOW (ref 80.0–100.0)
Platelets: 251 10*3/uL (ref 150–400)
RBC: 4.07 MIL/uL — ABNORMAL LOW (ref 4.22–5.81)
RDW: 11.9 % (ref 11.5–15.5)
WBC: 6.4 10*3/uL (ref 4.0–10.5)
nRBC: 0 % (ref 0.0–0.2)

## 2020-03-22 LAB — GLUCOSE, CAPILLARY
Glucose-Capillary: 423 mg/dL — ABNORMAL HIGH (ref 70–99)
Glucose-Capillary: 552 mg/dL (ref 70–99)
Glucose-Capillary: 367 mg/dL — ABNORMAL HIGH (ref 70–99)
Glucose-Capillary: 140 mg/dL — ABNORMAL HIGH (ref 70–99)
Glucose-Capillary: 309 mg/dL — ABNORMAL HIGH (ref 70–99)
Glucose-Capillary: 322 mg/dL — ABNORMAL HIGH (ref 70–99)
Glucose-Capillary: 301 mg/dL — ABNORMAL HIGH (ref 70–99)
Glucose-Capillary: 329 mg/dL — ABNORMAL HIGH (ref 70–99)
Glucose-Capillary: 353 mg/dL — ABNORMAL HIGH (ref 70–99)
Glucose-Capillary: 312 mg/dL — ABNORMAL HIGH (ref 70–99)
Glucose-Capillary: 364 mg/dL — ABNORMAL HIGH (ref 70–99)
Glucose-Capillary: 488 mg/dL — ABNORMAL HIGH (ref 70–99)
Glucose-Capillary: 522 mg/dL (ref 70–99)

## 2020-03-22 LAB — BASIC METABOLIC PANEL
Anion gap: 14 (ref 5–15)
BUN: 29 mg/dL — ABNORMAL HIGH (ref 6–20)
CO2: 21 mmol/L — ABNORMAL LOW (ref 22–32)
Calcium: 9 mg/dL (ref 8.9–10.3)
Chloride: 92 mmol/L — ABNORMAL LOW (ref 98–111)
Creatinine, Ser: 2.29 mg/dL — ABNORMAL HIGH (ref 0.61–1.24)
GFR, Estimated: 33 mL/min — ABNORMAL LOW (ref >60)
Glucose, Bld: 387 mg/dL — ABNORMAL HIGH (ref 70–99)
Potassium: 3.8 mmol/L (ref 3.5–5.1)
Sodium: 127 mmol/L — ABNORMAL LOW (ref 135–145)

## 2020-03-22 LAB — COMPREHENSIVE METABOLIC PANEL
ALT: 17 U/L (ref 0–44)
AST: 25 U/L (ref 15–41)
Albumin: 3.1 g/dL — ABNORMAL LOW (ref 3.5–5.0)
Alkaline Phosphatase: 97 U/L (ref 38–126)
Anion gap: 11 (ref 5–15)
BUN: 30 mg/dL — ABNORMAL HIGH (ref 6–20)
CO2: 22 mmol/L (ref 22–32)
Calcium: 9.5 mg/dL (ref 8.9–10.3)
Chloride: 87 mmol/L — ABNORMAL LOW (ref 98–111)
Creatinine, Ser: 2.33 mg/dL — ABNORMAL HIGH (ref 0.61–1.24)
GFR, Estimated: 32 mL/min — ABNORMAL LOW (ref >60)
Glucose, Bld: 761 mg/dL (ref 70–99)
Potassium: 4.8 mmol/L (ref 3.5–5.1)
Sodium: 120 mmol/L — ABNORMAL LOW (ref 135–145)
Total Bilirubin: 0.6 mg/dL (ref 0.3–1.2)
Total Protein: 8.9 g/dL — ABNORMAL HIGH (ref 6.5–8.1)

## 2020-03-22 LAB — POCT ACTIVATED CLOTTING TIME
Activated Clotting Time: 208 seconds
Activated Clotting Time: 166 seconds
Activated Clotting Time: 190 seconds
Activated Clotting Time: 273 seconds

## 2020-03-22 LAB — ECHOCARDIOGRAM COMPLETE
Area-P 1/2: 2.87 cm2
Height: 69 in
S' Lateral: 3.4 cm
Weight: 4169.34 oz

## 2020-03-22 LAB — RESP PANEL BY RT-PCR (FLU A&B, COVID) ARPGX2
Influenza A by PCR: NEGATIVE
Influenza B by PCR: NEGATIVE
SARS Coronavirus 2 by RT PCR: NEGATIVE

## 2020-03-22 LAB — TROPONIN I (HIGH SENSITIVITY)
Troponin I (High Sensitivity): 1065 ng/L (ref <18)
Troponin I (High Sensitivity): 1427 ng/L (ref <18)

## 2020-03-22 LAB — MRSA PCR SCREENING: MRSA by PCR: POSITIVE — AB

## 2020-03-22 LAB — OSMOLALITY: Osmolality: 304 mOsm/kg — ABNORMAL HIGH (ref 275–295)

## 2020-03-22 MED ORDER — CARVEDILOL 25 MG PO TABS
25.0000 mg | ORAL_TABLET | Freq: Two times a day (BID) | ORAL | Status: DC
  Administered 2020-03-22 – 2020-03-28 (×12): 25 mg via ORAL
  Filled 2020-03-22 (×14): qty 1

## 2020-03-22 MED ORDER — ENOXAPARIN SODIUM 60 MG/0.6ML ~~LOC~~ SOLN
60.0000 mg | SUBCUTANEOUS | Status: DC
  Administered 2020-03-22: 60 mg via SUBCUTANEOUS
  Filled 2020-03-22: qty 0.6

## 2020-03-22 MED ORDER — TICAGRELOR 90 MG PO TABS
ORAL_TABLET | ORAL | Status: AC
  Filled 2020-03-22: qty 1

## 2020-03-22 MED ORDER — MUPIROCIN 2 % EX OINT
1.0000 "application " | TOPICAL_OINTMENT | Freq: Two times a day (BID) | CUTANEOUS | Status: AC
  Administered 2020-03-22 – 2020-03-26 (×10): 1 via NASAL
  Filled 2020-03-22 (×4): qty 22

## 2020-03-22 MED ORDER — INSULIN ASPART 100 UNIT/ML ~~LOC~~ SOLN: 0.0000 [IU] | SUBCUTANEOUS | Status: DC

## 2020-03-22 MED ORDER — INSULIN ASPART 100 UNIT/ML ~~LOC~~ SOLN
20.0000 [IU] | Freq: Once | SUBCUTANEOUS | Status: AC
  Administered 2020-03-22: 20 [IU] via SUBCUTANEOUS

## 2020-03-22 MED ORDER — HEPARIN SODIUM (PORCINE) 1000 UNIT/ML IJ SOLN
INTRAMUSCULAR | Status: DC | PRN
  Administered 2020-03-22: 10000 [IU] via INTRAVENOUS

## 2020-03-22 MED ORDER — TICAGRELOR 90 MG PO TABS
ORAL_TABLET | ORAL | Status: DC | PRN
  Administered 2020-03-22: 180 mg via ORAL

## 2020-03-22 MED ORDER — SODIUM CHLORIDE 0.9% FLUSH
3.0000 mL | Freq: Two times a day (BID) | INTRAVENOUS | Status: DC
  Administered 2020-03-24 – 2020-03-28 (×6): 3 mL via INTRAVENOUS

## 2020-03-22 MED ORDER — NITROGLYCERIN 0.4 MG SL SUBL: 0.4000 mg | SUBLINGUAL_TABLET | SUBLINGUAL | Status: DC | PRN

## 2020-03-22 MED ORDER — IOHEXOL 350 MG/ML SOLN
INTRAVENOUS | Status: DC | PRN
  Administered 2020-03-22: 150 mL via INTRA_ARTERIAL

## 2020-03-22 MED ORDER — ATROPINE SULFATE 1 MG/10ML IJ SOSY
PREFILLED_SYRINGE | INTRAMUSCULAR | Status: AC
  Filled 2020-03-22: qty 10

## 2020-03-22 MED ORDER — SODIUM CHLORIDE 0.9 % IV SOLN: 250.0000 mL | INTRAVENOUS | Status: DC | PRN

## 2020-03-22 MED ORDER — DAPSONE 100 MG PO TABS
100.0000 mg | ORAL_TABLET | Freq: Every day | ORAL | Status: DC
Start: 2020-03-22 — End: 2020-03-28
  Administered 2020-03-22 – 2020-03-28 (×7): 100 mg via ORAL
  Filled 2020-03-22 (×8): qty 1

## 2020-03-22 MED ORDER — BICTEGRAVIR-EMTRICITAB-TENOFOV 50-200-25 MG PO TABS
1.0000 | ORAL_TABLET | Freq: Every day | ORAL | Status: DC
  Administered 2020-03-22 – 2020-03-28 (×7): 1 via ORAL
  Filled 2020-03-22 (×7): qty 1

## 2020-03-22 MED ORDER — CHLORHEXIDINE GLUCONATE CLOTH 2 % EX PADS
6.0000 | MEDICATED_PAD | Freq: Every day | CUTANEOUS | Status: AC
  Administered 2020-03-22 – 2020-03-26 (×3): 6 via TOPICAL

## 2020-03-22 MED ORDER — ONDANSETRON HCL 4 MG/2ML IJ SOLN: 4.0000 mg | Freq: Four times a day (QID) | INTRAMUSCULAR | Status: DC | PRN

## 2020-03-22 MED ORDER — SODIUM CHLORIDE 0.9% FLUSH: 3.0000 mL | INTRAVENOUS | Status: DC | PRN

## 2020-03-22 MED ORDER — TICAGRELOR 90 MG PO TABS
90.0000 mg | ORAL_TABLET | Freq: Two times a day (BID) | ORAL | Status: DC
  Administered 2020-03-22 – 2020-03-28 (×13): 90 mg via ORAL
  Filled 2020-03-22 (×13): qty 1

## 2020-03-22 MED ORDER — INSULIN ASPART 100 UNIT/ML ~~LOC~~ SOLN
0.0000 [IU] | SUBCUTANEOUS | Status: DC
  Administered 2020-03-22: 11 [IU] via SUBCUTANEOUS
  Administered 2020-03-22: 15 [IU] via SUBCUTANEOUS

## 2020-03-22 MED ORDER — LABETALOL HCL 5 MG/ML IV SOLN: 10.0000 mg | INTRAVENOUS | Status: DC | PRN

## 2020-03-22 MED ORDER — PERFLUTREN LIPID MICROSPHERE
1.0000 mL | INTRAVENOUS | Status: AC | PRN
  Administered 2020-03-22: 2 mL via INTRAVENOUS
  Filled 2020-03-22: qty 10

## 2020-03-22 MED ORDER — INSULIN GLARGINE 100 UNIT/ML ~~LOC~~ SOLN
30.0000 [IU] | Freq: Every day | SUBCUTANEOUS | Status: DC
  Administered 2020-03-22: 30 [IU] via SUBCUTANEOUS
  Filled 2020-03-22 (×2): qty 0.3

## 2020-03-22 MED ORDER — ASPIRIN 81 MG PO CHEW
81.0000 mg | CHEWABLE_TABLET | Freq: Every day | ORAL | Status: DC
  Administered 2020-03-22 – 2020-03-28 (×7): 81 mg via ORAL
  Filled 2020-03-22 (×7): qty 1

## 2020-03-22 MED ORDER — ATORVASTATIN CALCIUM 80 MG PO TABS
80.0000 mg | ORAL_TABLET | Freq: Every day | ORAL | Status: DC
  Administered 2020-03-22 – 2020-03-28 (×7): 80 mg via ORAL
  Filled 2020-03-22 (×7): qty 1

## 2020-03-22 MED ORDER — ATORVASTATIN CALCIUM 80 MG PO TABS: 80.0000 mg | ORAL_TABLET | Freq: Every day | ORAL | Status: DC

## 2020-03-22 MED ORDER — SODIUM CHLORIDE 0.9 % IV SOLN
INTRAVENOUS | Status: AC | PRN
  Administered 2020-03-22: 50 mL/h via INTRAVENOUS

## 2020-03-22 MED ORDER — HYDRALAZINE HCL 20 MG/ML IJ SOLN: 10.0000 mg | INTRAMUSCULAR | Status: DC | PRN

## 2020-03-22 MED ORDER — TICAGRELOR 90 MG PO TABS: 90.0000 mg | ORAL_TABLET | Freq: Two times a day (BID) | ORAL | Status: DC

## 2020-03-22 MED ORDER — SODIUM CHLORIDE 0.9 % IV SOLN: INTRAVENOUS | Status: DC

## 2020-03-22 MED ORDER — MORPHINE SULFATE (PF) 2 MG/ML IV SOLN
2.0000 mg | INTRAVENOUS | Status: DC | PRN
  Administered 2020-03-22: 2 mg via INTRAVENOUS
  Filled 2020-03-22: qty 1

## 2020-03-22 MED ORDER — INSULIN REGULAR(HUMAN) IN NACL 100-0.9 UT/100ML-% IV SOLN: INTRAVENOUS | Status: DC

## 2020-03-22 MED ORDER — CHLORHEXIDINE GLUCONATE CLOTH 2 % EX PADS: 6.0000 | MEDICATED_PAD | Freq: Every day | CUTANEOUS | Status: DC

## 2020-03-22 MED ORDER — ACETAMINOPHEN 325 MG PO TABS
650.0000 mg | ORAL_TABLET | ORAL | Status: DC | PRN
Start: 2020-03-22 — End: 2020-03-22

## 2020-03-22 MED ORDER — ACETAMINOPHEN 325 MG PO TABS
650.0000 mg | ORAL_TABLET | ORAL | Status: DC | PRN
  Administered 2020-03-24: 650 mg via ORAL
  Filled 2020-03-22: qty 2

## 2020-03-22 MED ORDER — LIDOCAINE HCL (PF) 1 % IJ SOLN
INTRAMUSCULAR | Status: DC | PRN
  Administered 2020-03-22: 15 mL via SUBCUTANEOUS

## 2020-03-22 MED ORDER — ASPIRIN 81 MG PO TBEC: 81.0000 mg | DELAYED_RELEASE_TABLET | Freq: Every day | ORAL | Status: DC

## 2020-03-22 MED ORDER — ASPIRIN EC 81 MG PO TBEC: 81.0000 mg | DELAYED_RELEASE_TABLET | Freq: Every day | ORAL | Status: DC

## 2020-03-22 MED FILL — Heparin Sod (Porcine)-NaCl IV Soln 1000 Unit/500ML-0.9%: INTRAVENOUS | Qty: 1500 | Status: AC

## 2020-03-22 MED FILL — Verapamil HCl IV Soln 2.5 MG/ML: INTRAVENOUS | Qty: 2 | Status: AC

## 2020-03-22 NOTE — TOC Benefit Eligibility Note (Signed)
Transition of Care [facility]) Benefit Eligibility Note    Patient Details  Name: Keith [name] MRN: [id number] Date of Birth: [birth year]   Medication/Dose: Brilinta 90 mg bid  Covered?: Yes   Prescription Coverage [facility]erred [facility] Person/Company/Phone Number:: [employer]  Co-Pay: $0 co-pay  Prior Approval: No    [place] Phone Number: 03/22/2020, 11:56 AM

## 2020-03-22 NOTE — Progress Notes (Signed)
Progress Note  Patient Name: Keith [name] Date of Encounter: 03/22/2020  [facility] Cardiologist: Larae Grooms, MD   Subjective   No chest pain or dyspnea.   Inpatient Medications    Scheduled Meds: . aspirin  81 mg Oral Daily  . atorvastatin  80 mg Oral Daily  . bictegravir-emtricitabine-tenofovir AF  1 tablet Oral Daily  . carvedilol  25 mg Oral BID WC  . Chlorhexidine Gluconate Cloth  6 each Topical Daily  . Chlorhexidine Gluconate Cloth  6 each Topical Q0600  . mupirocin ointment  1 application Nasal BID  . sodium chloride flush  3 mL Intravenous Q12H  . ticagrelor  90 mg Oral BID   Continuous Infusions: . sodium chloride    . sodium chloride Stopped (03/22/20 0448)  . dextrose 5% lactated ringers 125 mL/hr at 03/22/20 0800  . insulin 3 mL/hr at 03/22/20 0800   PRN Meds: sodium chloride, acetaminophen, dextrose, morphine injection, nitroGLYCERIN, ondansetron (ZOFRAN) IV, sodium chloride flush   Vital Signs    Vitals:   03/22/20 0536 03/22/20 0700 03/22/20 0800 03/22/20 0802  BP: (!) 143/96 93/69 106/79   Pulse: 70 65 77   Resp: 12 (!) 22 18   Temp:    97.8 F (36.6 C)  TempSrc:    Oral  SpO2: 100% 100% 100%   Weight:      Height:        Intake/Output Summary (Last 24 hours) at 03/22/2020 0847 Last data filed at 03/22/2020 0800 Gross per 24 hour  Intake 985.46 ml  Output 425 ml  Net 560.46 ml   Last 3 Weights 03/22/2020 03/22/2020 03/21/2020  Weight (lbs) 260 lb 9.3 oz 270 lb 370 lb  Weight (kg) 118.2 kg 122.471 kg 167.831 kg  Some encounter information is confidential and restricted. Go to Review Flowsheets activity to see all data.      Telemetry    NSR - Personally Reviewed  ECG    NSR, LAD. Persistent ST elevation in the inferolateral leads.  - Personally Reviewed  Physical Exam   GEN: No acute distress.   Neck: No JVD Cardiac: RRR, no murmurs, rubs, or gallops.  Respiratory: Clear to auscultation bilaterally. GI: Soft,  nontender, non-distended  MS: No edema; No deformity. No right femoral hematoma. Right BKA. Neuro:  Nonfocal  Psych: Normal affect   Labs    High Sensitivity Troponin:   Recent Labs  Lab 03/21/20 2232 03/22/20 0312  TROPONINIHS 1,065* 1,427*      Chemistry Recent Labs  Lab 03/21/20 1411 03/21/20 2109 03/21/20 2232 03/22/20 0312  NA 119* 122* 120* 127*  K 5.5* 5.5* 4.8 3.8  CL 83* 88* 87* 92*  CO2 22  --  22 21*  GLUCOSE 1,027* >700* 761* 387*  BUN 29* 36* 30* 29*  CREATININE 2.29* 2.10* 2.33* 2.29*  CALCIUM 9.4  --  9.5 9.0  PROT  --   --  8.9*  --   ALBUMIN  --   --  3.1*  --   AST  --   --  25  --   ALT  --   --  17  --   ALKPHOS  --   --  97  --   BILITOT  --   --  0.6  --   GFRNONAA 33*  --  32* 33*  ANIONGAP 14  --  11 14     Hematology Recent Labs  Lab 03/21/20 1411 03/21/20 2109 03/22/20 0312  WBC  6.1  --  6.4  RBC 4.58  --  4.07*  HGB 12.5* 15.0 11.0*  HCT 38.2* 44.0 31.7*  MCV 83.4  --  77.9*  MCH 27.3  --  27.0  MCHC 32.7  --  34.7  RDW 12.6  --  11.9  PLT 244  --  251    BNPNo results for input(s): BNP, PROBNP in the last 168 hours.   DDimer No results for input(s): DDIMER in the last 168 hours.   Radiology    CARDIAC CATHETERIZATION  Result Date: 03/22/2020  Mid Cx lesion is 60% stenosed.  1st Mrg-1 lesion is 95% stenosed.  Previously placed 1st Mrg-2 stent (unknown type) is widely patent.  Dist LAD-1 lesion is 80% stenosed.  Dist LAD-2 lesion is 90% stenosed.  Dist LAD-3 lesion is 90% stenosed.  Dist RCA lesion is 50% stenosed.  The left ventricular systolic function is normal.  LV end diastolic pressure is normal.  The left ventricular ejection fraction is 50-55% by visual estimate.  Keith [name] is a 54 y.o. male  [id number] LOCATION:  FACILITY: [facility] PHYSICIAN: Quay Burow, M.D. 22-Oct-1965 DATE OF PROCEDURE:  03/22/2020 DATE OF DISCHARGE: CARDIAC CATHETERIZATION / PCI DES OM1 History obtained from chart review.Keith [name] is a 54 y.o. male with a history of uncontrolled type 2 DM, CAD (with stenting of OM1 11/2018), HIV, right BKA, CKD, chronic back pain, diabetic neuropathy, onychomycosis, hyperlipidemia, and hypertension, who presented to the hospital with multiple symptoms after running out of his insulin 1 week ago. He has had nausea, vomiting, blurry vision, abdominal pain, diarrhea and polyuria. He has been consuming large amounts of fluids. For some reason he also drank "[facility]" since his friend advised him to take it. He himself is a poor historian.  He had a cardiac catheterization procedure in August 2020 when a stent was placed in the OM1 branch. He was also documented to have severe diffuse disease (90%) in the distal LAD. He does not recall taking his ASA recently. He also gives vague answers regarding his other medications.  During the current hospitalization, in the ED, the vital signs were: 141/117 mmHg, HR 90 bpm. The labs were:  NA 122, K 5.5, Gluc >700, BUN 36, Cr 2.1, Hgb 15, and Hct 44.  The ECG showed acute ST elevations in the inferior and anterolateral leads PROCEDURE DESCRIPTION: The patient was brought to the second floor [place] [facility]iac cath lab in the postabsorptive state. He was not Premedicated. Marland Kitchen His right groin was prepped and shaved in usual sterile fashion. Xylocaine 1% was used for local anesthesia. A 6 French sheath was inserted into the right common femoral  artery using standard Seldinger technique.  Ultrasound was used to identify the vessel and guide access.  Digital image was captured and placed the patient's chart.  A 5 French left Judkins diagnostic catheter, AL-1 diagnostic catheter and pigtail catheters were used for selective coronary angiography and left ventriculography respectively.  The RCA was anterior takeoff.  Isovue dye was used for the entirety of the case.  Retrograde aorta, ventricular and pullback pressures were recorded. The infarct-related artery was the  first obtuse marginal branch which had a 99% ostial stenosis.  This is a previously stented vessel by Dr. Ellyn Hack 1 year ago.  There was TIMI II flow.  Patient received 10,000 units of heparin with an ACT of approximate 275.  Isovue dye was used for the entirety of the case.  The patient received  Brilinta 180 mg p.o. Using a 6 French XB 3.5 mm guide catheter with a 0.14 [place] guidewire the first obtuse marginal branch was wired with some difficulty.  The origin of the LM branch was then predilated with a 2 mm x 12 mm balloon and stented with a 2.25 mm x 12 mm long Medtronic Onyx resolute drug-eluting stent carefully positioned at the ostium deployed at 14 atm (2.3 mm) resulting reduction of a 95% ostial OM1 stenosis to 0% residual.  Patient tolerated procedure well.  The guidewire and catheter were removed and the sheath was secured in place.   Successful OM1 ostial PCI drug-eluting stenting in the setting of inferolateral STEMI.  The patient was asymptomatic upon presentation and throughout the case.  There was TIMI-2 flow initially and TIMI-3 flow at the end of the case.  This was the only lesion that could potentially be implicated with the EKG changes.  The RCA was widely patent.  It did have an anterior takeoff.  The LAD had diffuse apical disease.  The EF appeared normal with hand-injection.  The patient will need dual antiplatelet therapy uninterrupted for at least 12 months.  2D echo will be obtained.  Aggressive risk factor modification will be recommended.  The patient did have severe hyper glycemia which will need to be addressed during his hospitalization. Quay Burow. MD, [facility] 03/22/2020 1:15 AM   DG Chest Portable 1 View  Result Date: 03/21/2020 CLINICAL DATA:  Hyperglycemia. STEMI on EKG. EXAM: PORTABLE CHEST 1 VIEW COMPARISON:  01/05/2020 FINDINGS: Stable mild cardiomegaly. Unchanged mediastinal contours with aortic tortuosity. Minor bibasilar atelectasis. No pulmonary edema. No  confluent consolidation. No pleural fluid or pneumothorax. No acute osseous abnormalities are seen. IMPRESSION: Stable mild cardiomegaly. Minor bibasilar atelectasis. Electronically Signed   By: Keith Rake M.D.   On: 03/21/2020 23:05    Cardiac Studies   Procedures  Coronary/Graft Acute MI Revascularization  LEFT HEART CATH AND CORONARY ANGIOGRAPHY    Conclusion    Mid Cx lesion is 60% stenosed.  1st Mrg-1 lesion is 95% stenosed.  Previously placed 1st Mrg-2 stent (unknown type) is widely patent.  Dist LAD-1 lesion is 80% stenosed.  Dist LAD-2 lesion is 90% stenosed.  Dist LAD-3 lesion is 90% stenosed.  Dist RCA lesion is 50% stenosed.  The left ventricular systolic function is normal.  LV end diastolic pressure is normal.  The left ventricular ejection fraction is 50-55% by visual estimate.    Patient Profile     54 y.o. male with a history of uncontrolled type 2 DM, CAD (with stenting of OM1 11/2018), HIV, right BKA, CKD, chronic back pain, diabetic neuropathy, onychomycosis, hyperlipidemia, and hypertension, who presented to the hospital with multiple symptoms after running out of his insulin 1 week ago. Denied any chest pain but Ecg on admit showed evidence of acute inferolateral STEMI  Assessment & Plan    1. Acute inferolateral STEMI. Symptoms of nausea but no chest pain. On cardiac cath found to have restenosis of stent in first OM that was felt to be the culprit. This was treated with PCI and repeat DES. On DAPT with ASA and Brilinta. On beta blocker and high dose statin. Troponin peak 1427. Patient has residual disease in the distal LAD that is not amenable to PCI. Echo is pending. Cardiac Rehab referral. 2. Severe hyperglycemia with uncontrolled DM. Ran out of insulin. Now in IV insulin. Sugars improving. Receiving IVF management per internal medicine team. 3. HLD on high dose statin. Current  lipid levels c/w noncompliance. 4. HTN currently  controlled. 5. S/p right BKA. Has prothesis and is able to ambulate. 6. Hyperosmolar hyponatremia. Improving 7. CKD stage 3b-4. Creatinine stable this am. Will need to watch closely with contrast load.  8. HIV positive. On Biktarvy  For questions or updates, please contact [place] Please consult [email or web address] for contact info under        Signed, Carlyon Nolasco [place], MD  03/22/2020, 8:47 AM

## 2020-03-22 NOTE — Progress Notes (Signed)
Patient seen and examined.  Doing better this morning.  Status post stent placement.  Plan is to start him on Lantus and discontinue insulin drip after 30 minutes.  Will also keep him on insulin sliding scale once taken off insulin drip.

## 2020-03-22 NOTE — Progress Notes (Signed)
Inpatient Diabetes Program Recommendations  AACE/ADA: New Consensus Statement on Inpatient Glycemic Control (2015)  Target Ranges:  Prepandial:   less than 140 mg/dL      Peak postprandial:   less than 180 mg/dL (1-2 hours)      Critically ill patients:  140 - 180 mg/dL   Lab Results  Component Value Date   GLUCAP 312 (H) 03/22/2020   HGBA1C 8.1 (A) 01/13/2019    Review of Glycemic Control  Spoke with pt about why he missed his insulin for 1 week. States he went to Pharmacy to get insulin and they said it was too soon for a refill. Could not pick up for 10 days. States he must have given himself too much insulin, and then ran out. Instructed pt to call his MD if this happens again. Pt said he has $ 0.00 copay and usually takes insulin as prescribed. Denies any hypoglycemia. Checks blood sugars daily. Discussed HgbA1C of 8.1% (average blood sugar 186) and needs to decrease to aprox 7-7.5%. Pt voices understanding.   Thank you. Lorenda Peck, RD, LDN, CDE Inpatient Diabetes Coordinator [phone]

## 2020-03-22 NOTE — Progress Notes (Signed)
Inpatient Diabetes Program Recommendations  AACE/ADA: New Consensus Statement on Inpatient Glycemic Control (2015)  Target Ranges:  Prepandial:   less than 140 mg/dL      Peak postprandial:   less than 180 mg/dL (1-2 hours)      Critically ill patients:  140 - 180 mg/dL   Lab Results  Component Value Date   GLUCAP 353 (H) 03/22/2020   HGBA1C 8.1 (A) 01/13/2019    Review of Glycemic Control  Diabetes history: DM2 Outpatient Diabetes medications: Levemir 20 units QD, Novolog 10 units tidwc, metformin 1000 mg bid Current orders for Inpatient glycemic control: IV insulin transitioning to Lantus 30 units QD, Novolog 0-15 units Q4H  HgbA1C - 8.1% on 01/13/20.  Inpatient Diabetes Program Recommendations:     Will need meal coverage insulin when po intake is normal Consider updated HgbA1C. TOC consult for problems in obtaining insulin. Has insurance and may need less expensive insulin, even with insurance and copay.  Speak with pt this afternoon regarding his glycemic control and importance of tight control.  Thank you. Lorenda Peck, RD, LDN, CDE Inpatient Diabetes Coordinator [phone]

## 2020-03-22 NOTE — Care Management (Signed)
Case management placed a benefit's check for Brilinta 90 mg by mouth twice a day for 30 day supply.

## 2020-03-22 NOTE — Progress Notes (Signed)
  Echocardiogram 2D Echocardiogram has been performed.  Matilde Bash 03/22/2020, 9:31 AM

## 2020-03-22 NOTE — Consult Note (Signed)
Consult Note   Keith [name] SPQ:[id number] DOB: October 12, 1965 DOA: 03/21/2020  PCP: [name], FNP  Patient coming from: Home  Requesting physician: Dr. Paticia Stack  I have personally briefly reviewed patient's old medical records in [place]  Chief Complaint: Polyuria, polydipsia, nausea, and a STEMI  HPI: Keith [name] is a 54 y.o. male with medical history significant of HIV on HAART, R BKA, CKD now progressed to stage 4 it looks like, DM2, HTN.  Pt with h/o MI in 11/2018, got OM1 stent placement with DES at that time.  Pt ran out of needles / insulin so hasnt been taking insulin for past 1 week at home.  Presents to ED with c/o nausea, vomiting, blurry vision, abd pain, polyuria, and polydipsia.  Symptoms are severe, not helped by drinking mountain dew that his friend recommended.  Symptoms are constant.  No fevers, chills, cough.   ED Course: Pt not complaining of CP initially.  However, his screening EKG showed that he was having an acute STEMI!  Initial trop of 1065.  Pt was emergently taken to cath lab, culprit lesion determined to be an OM1 lesion just proximal to prior stent.  PCI performed with stenting.  Pt now in cardiac ICU.  BGL on presentation in ED was 1027.  AG 14 and BHB was only 0.47.  Insulin gtt started in ED, though his BGL is still significantly elevated. At this time.  Creat of 2.3 on presentation appears to be his baseline based on labs in May of this year.  Hospitalist is asked to consult   Review of Systems: As per HPI, otherwise all review of systems negative.  Past Medical History:  Diagnosis Date  . Abscess of right foot    abscess/ulcer of R transtibial amputation requiring IV abx  . AIDS ([place])   . Chronic anemia   . Chronic knee pain    right  . Chronic pain   . CKD (chronic kidney disease), stage II   . Diabetes type 2, uncontrolled ([place])    HgA1c 17.6 (04/27/2010)  . Diabetic foot ulcer ([place]) 01/2017   right foot  .  Erectile dysfunction   . Genital warts   . HIV (human immunodeficiency virus infection) ([place]) 2009   CD4 count 100, VL 13800 (05/01/2010)  . Hypertension   . Osteomyelitis ([place] [address])    h/o hand  . Osteomyelitis of metatarsal ([place]) 04/28/2017    Past Surgical History:  Procedure Laterality Date  . AMPUTATION Right 05/02/2017   Procedure: AMPUTATION TRANSMETARSAL;  Surgeon: [name], MD;  Location: [facility];  Service: Orthopedics;  Laterality: Right;  . AMPUTATION Right 06/13/2017   Procedure: RIGHT BELOW KNEE AMPUTATION;  Surgeon: [name], MD;  Location: [facility];  Service: Orthopedics;  Laterality: Right;  . BELOW KNEE LEG AMPUTATION Right 06/13/2017  . BELOW KNEE LEG AMPUTATION    . CORONARY STENT INTERVENTION N/A 11/10/2018   Procedure: CORONARY STENT INTERVENTION;  Surgeon: Leonie Man, MD;  Location: [facility];  Service: Cardiovascular;  Laterality: N/A;  . HERNIA REPAIR    . I & D EXTREMITY Left 08/21/2014   Procedure: INCISION AND DRAINAGE LEFT SMALL FINGER;  Surgeon: Leanora Cover, MD;  Location: [facility];  Service: Orthopedics;  Laterality: Left;  . I & D EXTREMITY Right 03/18/2017   Procedure: IRRIGATION AND DEBRIDEMENT EXTREMITY;  Surgeon: [name], MD;  Location: [facility];  Service: Orthopedics;  Laterality: Right;  . LEFT HEART CATH  AND CORONARY ANGIOGRAPHY N/A 11/10/2018   Procedure: LEFT HEART CATH AND CORONARY ANGIOGRAPHY;  Surgeon: Leonie Man, MD;  Location: [facility];  Service: Cardiovascular;  Laterality: N/A;  . MINOR IRRIGATION AND DEBRIDEMENT OF WOUND Right 04/22/2014   Procedure: IRRIGATION AND DEBRIDEMENT OF RIGHT NECK ABCESS;  Surgeon: Jerrell Belfast, MD;  Location: [facility];  Service: ENT;  Laterality: Right;  . MULTIPLE EXTRACTIONS WITH ALVEOLOPLASTY N/A 01/18/2013   Procedure: MULTIPLE EXTRACION 3, 6, 7, 10, 11, 13, 21, 22, 27, 28, 29, 30 WITH ALVEOLOPLASTY;  Surgeon: Gae Bon, DDS;  Location: [facility];  Service: Oral Surgery;  Laterality:  N/A;  . SKIN SPLIT GRAFT Right 03/21/2017   Procedure: IRRIGATION AND DEBRIDEMENT RIGHT FOOT AND APPLY SPLIT THICKNESS SKIN GRAFT AND WOUND VAC;  Surgeon: [name], MD;  Location: [facility];  Service: Orthopedics;  Laterality: Right;  . TEE WITHOUT CARDIOVERSION N/A 05/02/2017   Procedure: TRANSESOPHAGEAL ECHOCARDIOGRAM (TEE);  Surgeon: Acie Fredrickson Wonda Cheng, MD;  Location: [facility];  Service: Cardiovascular;  Laterality: N/A;  coincidental to orthopedic case     reports that he has never smoked. He has never used smokeless tobacco. He reports previous alcohol use. He reports previous drug use.  Allergies  Allergen Reactions  . Sulfur Itching    Patient stated he's allergic to "sulfur" AND "sulfa"  . Sulfa Antibiotics Itching    Family History  Problem Relation Age of Onset  . Hypertension Mother   . Arthritis Father   . Hypertension Father   . Hypertension Brother   . Cancer Maternal Grandmother 80       unknown type of cancer  . Depression Paternal Grandmother      Prior to Admission medications   Medication Sig Start Date End Date Taking? Authorizing Provider  amLODipine (NORVASC) 10 MG tablet Take 1 tablet (10 mg total) by mouth daily. 02/07/19   Jacqlyn Larsen, PA-C  aspirin EC 81 MG EC tablet Take 1 tablet (81 mg total) by mouth daily. 11/13/18   Cheryln Manly, NP  atorvastatin (LIPITOR) 80 MG tablet Take 1 tablet (80 mg total) by mouth daily at 6 PM. 12/21/19   [name], FNP  benzonatate (TESSALON) 200 MG capsule Take 1 capsule (200 mg total) by mouth 2 (two) times daily as needed for cough. 11/04/19   Raylene Everts, MD  BIKTARVY 50-200-25 MG TABS tablet TAKE 1 TABLET BY MOUTH DAILY. 03/20/20   [name], MD  blood glucose meter kit and supplies Dispense based on patient and insurance preference. Use up to four times daily as directed. (FOR ICD-10 E10.9, E11.9). Patient taking differently: 1 each by Other route See admin instructions. Dispense based on  patient and insurance preference. Use up to four times daily as directed. (FOR ICD-10 E10.9, E11.9). 01/13/19   [name], FNP  carvedilol (COREG) 25 MG tablet Take 1 tablet (25 mg total) by mouth 2 (two) times daily with a meal. Please make appt for future refills. [phone]. 1st attempt. 03/22/19   Cheryln Manly, NP  collagenase (SANTYL) ointment Apply topically 2 (two) times daily at 10 AM and 5 PM. 11/03/18   Kayleen Memos, DO  doxycycline (VIBRA-TABS) 100 MG tablet Take 1 tablet (100 mg total) by mouth 2 (two) times daily. 12/01/19   Persons, Bevely Palmer, PA  EASY COMFORT PEN NEEDLES 31G X 5 MM MISC INJECT 20 UNITS DAILY AS DIRECTED. 11/10/19   [name], FNP  escitalopram (LEXAPRO) 20 MG tablet Take 30 mg by mouth daily. Taking 1 & 1/2 tablet(s) daily 07/15/18   [provider]  glucose blood (COOL BLOOD GLUCOSE TEST STRIPS) test strip Use as instructed 02/16/20   [name], FNP  insulin aspart (NOVOLOG FLEXPEN) 100 UNIT/ML FlexPen Inject 10 units under skin 3 times a day, with meals, for blood glucose levels greater than 125. 02/16/20   [name], FNP  Insulin Syringes, Disposable, U-100 0.3 ML MISC 1 Syringe by Does not apply route 3 (three) times daily. 02/16/20   [name], FNP  Lancets [facility] ULTRASOFT) lancets Use as instructed 02/16/20   [name], FNP  LEVEMIR 100 UNIT/ML injection Inject 0.2 mLs (20 Units total) into the skin daily. 02/16/20 10/08/21  [name], FNP  loperamide (IMODIUM A-D) 2 MG tablet Take 1 tablet (2 mg total) by mouth 4 (four) times daily as needed for diarrhea or loose stools. 01/13/19   [name], FNP  metFORMIN (GLUCOPHAGE) 1000 MG tablet Take 1,000 mg by mouth 2 (two) times daily. 03/21/19   [provider]  nitroGLYCERIN (NITROSTAT) 0.4 MG SL tablet Place 1 tablet (0.4 mg total) under the tongue every 5 (five) minutes x 3 doses as needed for chest pain. 11/13/18   Cheryln Manly,  NP  oxybutynin (DITROPAN) 5 MG tablet TAKE 1 TABLET (5 MG TOTAL) BY MOUTH 2 (TWO) TIMES DAILY. 10/19/18   [name], FNP  oxyCODONE 10 MG TABS Take 1 tablet (10 mg total) by mouth daily as needed for up to 5 doses for severe pain or breakthrough pain (pain). 11/03/18   Kayleen Memos, DO  silver sulfADIAZINE (SILVADENE) 1 % cream Apply 1 application topically daily. Apply to wound daily 11/06/18   [name], MD  ticagrelor (BRILINTA) 90 MG TABS tablet Take 1 tablet (90 mg total) by mouth every 12 (twelve) hours. 11/13/18   Cheryln Manly, NP    Physical Exam: Vitals:   03/21/20 2315 03/22/20 0011 03/22/20 0028 03/22/20 0130  BP: (!) 141/117   (!) 122/92  Pulse: 90   76  Resp: 20   16  Temp:    98 F (36.7 C)  TempSrc:    Oral  SpO2: 100% 100%  98%  Weight:   122.5 kg 118.2 kg  Height:    _0  (1.753 m)    Constitutional: NAD, calm, comfortable Eyes: PERRL, lids and conjunctivae normal ENMT: Mucous membranes are moist. Posterior pharynx clear of any exudate or lesions.Normal dentition.  Neck: normal, supple, no masses, no thyromegaly Respiratory: clear to auscultation bilaterally, no wheezing, no crackles. Normal respiratory effort. No accessory muscle use.  Cardiovascular: Regular rate and rhythm, no murmurs / rubs / gallops. No extremity edema. 2+ pedal pulses. No carotid bruits.  Abdomen: no tenderness, no masses palpated. No hepatosplenomegaly. Bowel sounds positive.  Musculoskeletal: no clubbing / cyanosis. No joint deformity upper and lower extremities. Good ROM, no contractures. Normal muscle tone.  Skin: no rashes, lesions, ulcers. No induration Neurologic: CN 2-12 grossly intact. Sensation intact, DTR normal. Strength 5/5 in all 4.  Psychiatric: Normal judgment and insight. Alert and oriented x 3. Normal mood.    Labs on Admission: I have personally reviewed following labs and imaging studies  CBC: Recent Labs  Lab 03/21/20 1411 03/21/20 2109  WBC 6.1  --    HGB 12.5* 15.0  HCT 38.2* 44.0  MCV 83.4  --   PLT 244  --  Basic Metabolic Panel: Recent Labs  Lab 03/21/20 1411 03/21/20 2109 03/21/20 2232  NA 119* 122* 120*  K 5.5* 5.5* 4.8  CL 83* 88* 87*  CO2 22  --  22  GLUCOSE 1,027* >700* 761*  BUN 29* 36* 30*  CREATININE 2.29* 2.10* 2.33*  CALCIUM 9.4  --  9.5   GFR: Estimated Creatinine Clearance: 45.4 mL/min (A) (by C-G formula based on SCr of 2.33 mg/dL (H)). Liver Function Tests: Recent Labs  Lab 03/21/20 2232  AST 25  ALT 17  ALKPHOS 97  BILITOT 0.6  PROT 8.9*  ALBUMIN 3.1*   No results for input(s): LIPASE, AMYLASE in the last 168 hours. No results for input(s): AMMONIA in the last 168 hours. Coagulation Profile: Recent Labs  Lab 03/21/20 2232  INR 1.0   Cardiac Enzymes: No results for input(s): CKTOTAL, CKMB, CKMBINDEX, TROPONINI in the last 168 hours. BNP (last 3 results) No results for input(s): PROBNP in the last 8760 hours. HbA1C: No results for input(s): HGBA1C in the last 72 hours. CBG: Recent Labs  Lab 03/21/20 2121 03/21/20 2214 03/21/20 2312 03/22/20 0137 03/22/20 0228  GLUCAP >600* >600* >600* 552* 423*   Lipid Profile: Recent Labs    03/21/20 2241  CHOL 291*  HDL 30*  LDLCALC 199*  TRIG 310*  CHOLHDL 9.7   Thyroid Function Tests: No results for input(s): TSH, T4TOTAL, FREET4, T3FREE, THYROIDAB in the last 72 hours. Anemia Panel: No results for input(s): VITAMINB12, FOLATE, FERRITIN, TIBC, IRON, RETICCTPCT in the last 72 hours. Urine analysis:    Component Value Date/Time   COLORURINE STRAW (A) 03/21/2020 1851   APPEARANCEUR CLEAR 03/21/2020 1851   LABSPEC 1.022 03/21/2020 1851   PHURINE 6.0 03/21/2020 1851   GLUCOSEU >=500 (A) 03/21/2020 1851   HGBUR NEGATIVE 03/21/2020 1851   BILIRUBINUR NEGATIVE 03/21/2020 1851   BILIRUBINUR negative 09/07/2018 [place] 03/21/2020 1851   PROTEINUR 100 (A) 03/21/2020 1851   UROBILINOGEN 1.0 09/07/2018 1034    UROBILINOGEN 1.0 01/01/2017 1032   NITRITE NEGATIVE 03/21/2020 1851   LEUKOCYTESUR NEGATIVE 03/21/2020 1851    Radiological Exams on Admission: CARDIAC CATHETERIZATION  Result Date: 03/22/2020  Mid Cx lesion is 60% stenosed.  1st Mrg-1 lesion is 95% stenosed.  Previously placed 1st Mrg-2 stent (unknown type) is widely patent.  Dist LAD-1 lesion is 80% stenosed.  Dist LAD-2 lesion is 90% stenosed.  Dist LAD-3 lesion is 90% stenosed.  Dist RCA lesion is 50% stenosed.  The left ventricular systolic function is normal.  LV end diastolic pressure is normal.  The left ventricular ejection fraction is 50-55% by visual estimate.  RYLEIGH BUENGER is a 54 y.o. male  [id number] LOCATION:  FACILITY: [facility] PHYSICIAN: Quay Burow, M.D. [birth year] DATE OF PROCEDURE:  03/22/2020 DATE OF DISCHARGE: CARDIAC CATHETERIZATION / PCI DES OM1 History obtained from chart review.DANYL DEEMS is a 54 y.o. male with a history of uncontrolled type 2 DM, CAD (with stenting of OM1 11/2018), HIV, right BKA, CKD, chronic back pain, diabetic neuropathy, onychomycosis, hyperlipidemia, and hypertension, who presented to the hospital with multiple symptoms after running out of his insulin 1 week ago. He has had nausea, vomiting, blurry vision, abdominal pain, diarrhea and polyuria. He has been consuming large amounts of fluids. For some reason he also drank "[facility]" since his friend advised him to take it. He himself is a poor historian.  He had a cardiac catheterization procedure in August 2020 when a stent was placed in the  OM1 branch. He was also documented to have severe diffuse disease (90%) in the distal LAD. He does not recall taking his ASA recently. He also gives vague answers regarding his other medications.  During the current hospitalization, in the ED, the vital signs were: 141/117 mmHg, HR 90 bpm. The labs were:  NA 122, K 5.5, Gluc >700, BUN 36, Cr 2.1, Hgb 15, and Hct 44.  The ECG showed acute ST elevations  in the inferior and anterolateral leads PROCEDURE DESCRIPTION: The patient was brought to the second floor [facility]iac cath lab in the postabsorptive state. He was not Premedicated. Marland Kitchen His right groin was prepped and shaved in usual sterile fashion. Xylocaine 1% was used for local anesthesia. A 6 French sheath was inserted into the right common femoral  artery using standard Seldinger technique.  Ultrasound was used to identify the vessel and guide access.  Digital image was captured and placed the patient's chart.  A 5 French left Judkins diagnostic catheter, AL-1 diagnostic catheter and pigtail catheters were used for selective coronary angiography and left ventriculography respectively.  The RCA was anterior takeoff.  Isovue dye was used for the entirety of the case.  Retrograde aorta, ventricular and pullback pressures were recorded. The infarct-related artery was the first obtuse marginal branch which had a 99% ostial stenosis.  This is a previously stented vessel by Dr. Ellyn Hack 1 year ago.  There was TIMI II flow.  Patient received 10,000 units of heparin with an ACT of approximate 275.  Isovue dye was used for the entirety of the case.  The patient received Brilinta 180 mg p.o. Using a 6 French XB 3.5 mm guide catheter with a 0.14 [place] guidewire the first obtuse marginal branch was wired with some difficulty.  The origin of the LM branch was then predilated with a 2 mm x 12 mm balloon and stented with a 2.25 mm x 12 mm long Medtronic Onyx resolute drug-eluting stent carefully positioned at the ostium deployed at 14 atm (2.3 mm) resulting reduction of a 95% ostial OM1 stenosis to 0% residual.  Patient tolerated procedure well.  The guidewire and catheter were removed and the sheath was secured in place.   Successful OM1 ostial PCI drug-eluting stenting in the setting of inferolateral STEMI.  The patient was asymptomatic upon presentation and throughout the case.  There was TIMI-2 flow initially and  TIMI-3 flow at the end of the case.  This was the only lesion that could potentially be implicated with the EKG changes.  The RCA was widely patent.  It did have an anterior takeoff.  The LAD had diffuse apical disease.  The EF appeared normal with hand-injection.  The patient will need dual antiplatelet therapy uninterrupted for at least 12 months.  2D echo will be obtained.  Aggressive risk factor modification will be recommended.  The patient did have severe hyper glycemia which will need to be addressed during his hospitalization. Quay Burow. MD, [facility] 03/22/2020 1:15 AM   DG Chest Portable 1 View  Result Date: 03/21/2020 CLINICAL DATA:  Hyperglycemia. STEMI on EKG. EXAM: PORTABLE CHEST 1 VIEW COMPARISON:  01/05/2020 FINDINGS: Stable mild cardiomegaly. Unchanged mediastinal contours with aortic tortuosity. Minor bibasilar atelectasis. No pulmonary edema. No confluent consolidation. No pleural fluid or pneumothorax. No acute osseous abnormalities are seen. IMPRESSION: Stable mild cardiomegaly. Minor bibasilar atelectasis. Electronically Signed   By: Keith Rake M.D.   On: 03/21/2020 23:05    EKG: Independently reviewed. Shows inferior and anterolateral STEMI.  Assessment/Plan Principal Problem:   Acute ST elevation myocardial infarction (STEMI) due to occlusion of circumflex coronary artery (HCC) Active Problems:   Essential hypertension   HIV disease (HCC)   STEMI (ST elevation myocardial infarction) (HCC)   CKD (chronic kidney disease) stage 4, GFR 15-29 ml/min (HCC)   Uncontrolled type 2 diabetes mellitus with hyperglycemia ([place])    1. Acute STEMI - 1. S/p stent to OM1, just proximal to the 11/2018 stent he had put in OM1 2. Sounds like wasn't taking ASA recently 1. Unclear if today's occlusion was related to not taking meds in setting of prior stenting or not. 3. Management per cardiology, currently primary. 2. DM2 with hyperglycemia - 1. Spoke with Dr. Paticia Stack - pt not in  cardiogenic shock at this time and he feels im okay to give IVF as would normally for severe hyperglycemia. 2. Hyperglycemic crisis order set 3. Insulin gtt 4. IVF: NS at 125 until BGL < 250 then switch to D5 LR 1. PT NPO for the moment anyhow as he has sheath still in from [facility]. 5. Repeat BMP in AM 6. Serum OSM pending, but BGL rapidly improving already (down to 423 at this time). 3. CKD 4 - 1. IVF as above post cath 2. Intake and output 3. BMP repeat in AM 4. HIV - 1. Continue HAART 2. Not clear if hes compliant with this at home or not though 3. CD4 count in May was only 89, and viral load was 7280 5. HTN - 1. Meds per cardiology    Jennette Kettle M. DO [facility]  03/22/2020, 2:31 AM

## 2020-03-22 NOTE — Progress Notes (Signed)
CARDIAC REHAB PHASE I   PRE:  Rate/Rhythm: 68 SR  BP:  Supine: 95/70  Sitting:   Standing:    SaO2: 99%RA  MODE:  Ambulation: 160 ft   POST:  Rate/Rhythm: 74 SR  BP:  Supine: 85/58, 82/64  Sitting:   Standing:    SaO2: 96%RA 1255-1335 Pt put on prosthesis and he walked 160 ft on RA. Pt wobbly but he did not want assistance. Very independent. BP low after walk . When asked, pt stated he was a little lightheaded. Did not want to sit in recliner at this time but laid across bed. RN aware. Pt instructed to take brilinta morning and evening and not to let it run out. Encouraged to take his insulin. Reviewed NTG use and MI restrictions. Left MI booklet and heart healthy and diabetic diets. Pt to see dietitian later. Has referral to [facility] 2 . Pt voiced understanding of ed but he will need reinforcement.   Graylon Good, RN BSN  03/22/2020 1:30 PM

## 2020-03-22 NOTE — Progress Notes (Signed)
Sheath was removed and intact. Pressure was held over site for 20 minutes. Homeostasis was achieved. Level zero with minimal bleeding. Gauze dressing was applied. Patient was educated about precautions and bleeding.

## 2020-03-23 ENCOUNTER — Inpatient Hospital Stay (HOSPITAL_COMMUNITY): Payer: Medicare HMO

## 2020-03-23 LAB — URINALYSIS, ROUTINE W REFLEX MICROSCOPIC
Bilirubin Urine: NEGATIVE
Glucose, UA: >=500 mg/dL — AB
Hgb urine dipstick: NEGATIVE
Ketones, ur: NEGATIVE mg/dL
Leukocytes,Ua: NEGATIVE
Nitrite: NEGATIVE
Protein, ur: >=300 mg/dL — AB
Specific Gravity, Urine: 1.031 — ABNORMAL HIGH (ref 1.005–1.030)
pH: 5 (ref 5.0–8.0)

## 2020-03-23 LAB — BASIC METABOLIC PANEL
Anion gap: 12 (ref 5–15)
BUN: 38 mg/dL — ABNORMAL HIGH (ref 6–20)
CO2: 23 mmol/L (ref 22–32)
Calcium: 9.2 mg/dL (ref 8.9–10.3)
Chloride: 96 mmol/L — ABNORMAL LOW (ref 98–111)
Creatinine, Ser: 3.87 mg/dL — ABNORMAL HIGH (ref 0.61–1.24)
GFR, Estimated: 18 mL/min — ABNORMAL LOW (ref >60)
Glucose, Bld: 241 mg/dL — ABNORMAL HIGH (ref 70–99)
Potassium: 3.8 mmol/L (ref 3.5–5.1)
Sodium: 131 mmol/L — ABNORMAL LOW (ref 135–145)

## 2020-03-23 LAB — GLUCOSE, CAPILLARY
Glucose-Capillary: 146 mg/dL — ABNORMAL HIGH (ref 70–99)
Glucose-Capillary: 172 mg/dL — ABNORMAL HIGH (ref 70–99)
Glucose-Capillary: 178 mg/dL — ABNORMAL HIGH (ref 70–99)
Glucose-Capillary: 199 mg/dL — ABNORMAL HIGH (ref 70–99)
Glucose-Capillary: 209 mg/dL — ABNORMAL HIGH (ref 70–99)
Glucose-Capillary: 211 mg/dL — ABNORMAL HIGH (ref 70–99)
Glucose-Capillary: 212 mg/dL — ABNORMAL HIGH (ref 70–99)
Glucose-Capillary: 216 mg/dL — ABNORMAL HIGH (ref 70–99)
Glucose-Capillary: 229 mg/dL — ABNORMAL HIGH (ref 70–99)
Glucose-Capillary: 242 mg/dL — ABNORMAL HIGH (ref 70–99)
Glucose-Capillary: 242 mg/dL — ABNORMAL HIGH (ref 70–99)
Glucose-Capillary: 247 mg/dL — ABNORMAL HIGH (ref 70–99)
Glucose-Capillary: 249 mg/dL — ABNORMAL HIGH (ref 70–99)
Glucose-Capillary: 250 mg/dL — ABNORMAL HIGH (ref 70–99)
Glucose-Capillary: 327 mg/dL — ABNORMAL HIGH (ref 70–99)
Glucose-Capillary: 350 mg/dL — ABNORMAL HIGH (ref 70–99)
Glucose-Capillary: 357 mg/dL — ABNORMAL HIGH (ref 70–99)
Glucose-Capillary: 357 mg/dL — ABNORMAL HIGH (ref 70–99)
Glucose-Capillary: 534 mg/dL (ref 70–99)

## 2020-03-23 LAB — HEMOGLOBIN A1C
Hgb A1c MFr Bld: >15.5 % — ABNORMAL HIGH (ref 4.8–5.6)
Mean Plasma Glucose: >398 mg/dL

## 2020-03-23 LAB — CREATININE, URINE, RANDOM: Creatinine, Urine: 210.24 mg/dL

## 2020-03-23 LAB — PROTEIN / CREATININE RATIO, URINE
Creatinine, Urine: 206.65 mg/dL
Protein Creatinine Ratio: 4.08 mg/mg{Cre} — ABNORMAL HIGH (ref 0.00–0.15)
Total Protein, Urine: 843 mg/dL

## 2020-03-23 LAB — SODIUM, URINE, RANDOM: Sodium, Ur: <10 mmol/L

## 2020-03-23 MED ORDER — DEXTROSE 50 % IV SOLN: 0.0000 mL | INTRAVENOUS | Status: DC | PRN

## 2020-03-23 MED ORDER — INSULIN ASPART 100 UNIT/ML ~~LOC~~ SOLN
0.0000 [IU] | Freq: Every day | SUBCUTANEOUS | Status: DC
  Administered 2020-03-23 – 2020-03-24 (×2): 5 [IU] via SUBCUTANEOUS
  Administered 2020-03-27: 2 [IU] via SUBCUTANEOUS

## 2020-03-23 MED ORDER — DEXTROSE IN LACTATED RINGERS 5 % IV SOLN: INTRAVENOUS | Status: DC

## 2020-03-23 MED ORDER — HEPARIN SODIUM (PORCINE) 5000 UNIT/ML IJ SOLN
5000.0000 [IU] | Freq: Three times a day (TID) | INTRAMUSCULAR | Status: DC
  Administered 2020-03-23 – 2020-03-28 (×14): 5000 [IU] via SUBCUTANEOUS
  Filled 2020-03-23 (×15): qty 1

## 2020-03-23 MED ORDER — INSULIN GLARGINE 100 UNIT/ML ~~LOC~~ SOLN
38.0000 [IU] | Freq: Every day | SUBCUTANEOUS | Status: DC
  Administered 2020-03-23 – 2020-03-24 (×2): 38 [IU] via SUBCUTANEOUS
  Filled 2020-03-23 (×2): qty 0.38

## 2020-03-23 MED ORDER — INSULIN REGULAR(HUMAN) IN NACL 100-0.9 UT/100ML-% IV SOLN
INTRAVENOUS | Status: DC
  Administered 2020-03-23: 6 [IU]/h via INTRAVENOUS
  Filled 2020-03-23: qty 100

## 2020-03-23 MED ORDER — LACTATED RINGERS IV SOLN: INTRAVENOUS | Status: DC

## 2020-03-23 MED ORDER — INSULIN ASPART 100 UNIT/ML ~~LOC~~ SOLN
0.0000 [IU] | Freq: Three times a day (TID) | SUBCUTANEOUS | Status: DC
  Administered 2020-03-23: 15 [IU] via SUBCUTANEOUS
  Administered 2020-03-24: 8 [IU] via SUBCUTANEOUS
  Administered 2020-03-24: 5 [IU] via SUBCUTANEOUS
  Administered 2020-03-24 – 2020-03-25 (×2): 8 [IU] via SUBCUTANEOUS

## 2020-03-23 NOTE — Progress Notes (Signed)
[id number] Came to see pt to walk. Pt very sleepy and goes back to sleep quickly. NSR at 71 and BP 108/72. Pt can walk with staff later when more awake. Will continue to follow. Graylon Good RN BSN 03/23/2020 11:37 AM

## 2020-03-23 NOTE — Progress Notes (Signed)
Progress Note  Patient Name: Keith [name] Date of Encounter: 03/23/2020  [facility] Cardiologist: Larae Grooms, MD   Subjective   No chest pain, some nausea this morning.   Inpatient Medications    Scheduled Meds: . aspirin  81 mg Oral Daily  . atorvastatin  80 mg Oral Daily  . bictegravir-emtricitabine-tenofovir AF  1 tablet Oral Daily  . carvedilol  25 mg Oral BID WC  . Chlorhexidine Gluconate Cloth  6 each Topical Q0600  . dapsone  100 mg Oral Daily  . heparin injection (subcutaneous)  5,000 Units Subcutaneous Q8H  . insulin glargine  30 Units Subcutaneous Daily  . mupirocin ointment  1 application Nasal BID  . sodium chloride flush  3 mL Intravenous Q12H  . ticagrelor  90 mg Oral BID   Continuous Infusions: . sodium chloride    . dextrose 5% lactated ringers 75 mL/hr at 03/23/20 0208  . insulin 3.4 Units/hr (03/23/20 0732)  . lactated ringers Stopped (03/23/20 0209)   PRN Meds: sodium chloride, acetaminophen, dextrose, morphine injection, nitroGLYCERIN, ondansetron (ZOFRAN) IV, sodium chloride flush   Vital Signs    Vitals:   03/22/20 1400 03/22/20 1459 03/22/20 2033 03/23/20 0400  BP: 98/83 (!) 92/53 113/84 110/74  Pulse: 64 73 73 67  Resp: _0 Temp:  98.5 F (36.9 C) 98.6 F (37 C) 98.5 F (36.9 C)  TempSrc:  Oral Oral Oral  SpO2: 100% 97% 99% 99%  Weight:  119.7 kg    Height:  5' 9" (1.753 m)      Intake/Output Summary (Last 24 hours) at 03/23/2020 0757 Last data filed at 03/23/2020 0635 Gross per 24 hour  Intake 933.17 ml  Output 1025 ml  Net -91.83 ml   Last 3 Weights 03/22/2020 03/22/2020 03/22/2020  Weight (lbs) 264 lb 260 lb 9.3 oz 270 lb  Weight (kg) 119.75 kg 118.2 kg 122.471 kg  Some encounter information is confidential and restricted. Go to Review Flowsheets activity to see all data.      Telemetry    SR with persistent ST elevation - Personally Reviewed  ECG    No new tracing  Physical Exam   GEN: No  acute distress.   Neck: No JVD Cardiac: RRR, no murmurs, rubs, or gallops.  Respiratory: Clear to auscultation bilaterally. GI: Soft, nontender, non-distended  MS: No edema; right BKA Neuro:  Nonfocal  Psych: Normal affect   Labs    High Sensitivity Troponin:   Recent Labs  Lab 03/21/20 2232 03/22/20 0312  TROPONINIHS 1,065* 1,427*      Chemistry Recent Labs  Lab 03/21/20 2232 03/22/20 0312 03/23/20 0213  NA 120* 127* 131*  K 4.8 3.8 3.8  CL 87* 92* 96*  CO2 22 21* 23  GLUCOSE 761* 387* 241*  BUN 30* 29* 38*  CREATININE 2.33* 2.29* 3.87*  CALCIUM 9.5 9.0 9.2  PROT 8.9*  --   --   ALBUMIN 3.1*  --   --   AST 25  --   --   ALT 17  --   --   ALKPHOS 97  --   --   BILITOT 0.6  --   --   GFRNONAA 32* 33* 18*  ANIONGAP _1 Hematology Recent Labs  Lab 03/21/20 1411 03/21/20 2109 03/22/20 0312  WBC 6.1  --  6.4  RBC 4.58  --  4.07*  HGB 12.5* 15.0 11.0*  HCT 38.2* 44.0 31.7*  MCV 83.4  --  77.9*  MCH 27.3  --  27.0  MCHC 32.7  --  34.7  RDW 12.6  --  11.9  PLT 244  --  251    BNPNo results for input(s): BNP, PROBNP in the last 168 hours.   DDimer No results for input(s): DDIMER in the last 168 hours.   Radiology    CARDIAC CATHETERIZATION  Result Date: 03/22/2020  Mid Cx lesion is 60% stenosed.  1st Mrg-1 lesion is 95% stenosed.  Previously placed 1st Mrg-2 stent (unknown type) is widely patent.  Dist LAD-1 lesion is 80% stenosed.  Dist LAD-2 lesion is 90% stenosed.  Dist LAD-3 lesion is 90% stenosed.  Dist RCA lesion is 50% stenosed.  The left ventricular systolic function is normal.  LV end diastolic pressure is normal.  The left ventricular ejection fraction is 50-55% by visual estimate.  Keith [name] is a 54 y.o. male  [id number] LOCATION:  FACILITY: [facility] PHYSICIAN: Quay Burow, M.D. Mar 16, 1966 DATE OF PROCEDURE:  03/22/2020 DATE OF DISCHARGE: CARDIAC CATHETERIZATION / PCI DES OM1 History obtained from chart review.Keith [name]  is a 54 y.o. male with a history of uncontrolled type 2 DM, CAD (with stenting of OM1 11/2018), HIV, right BKA, CKD, chronic back pain, diabetic neuropathy, onychomycosis, hyperlipidemia, and hypertension, who presented to the hospital with multiple symptoms after running out of his insulin 1 week ago. He has had nausea, vomiting, blurry vision, abdominal pain, diarrhea and polyuria. He has been consuming large amounts of fluids. For some reason he also drank "[facility]" since his friend advised him to take it. He himself is a poor historian.  He had a cardiac catheterization procedure in August 2020 when a stent was placed in the OM1 branch. He was also documented to have severe diffuse disease (90%) in the distal LAD. He does not recall taking his ASA recently. He also gives vague answers regarding his other medications.  During the current hospitalization, in the ED, the vital signs were: 141/117 mmHg, HR 90 bpm. The labs were:  NA 122, K 5.5, Gluc >700, BUN 36, Cr 2.1, Hgb 15, and Hct 44.  The ECG showed acute ST elevations in the inferior and anterolateral leads PROCEDURE DESCRIPTION: The patient was brought to the second floor [place] Cardiac cath lab in the postabsorptive state. He was not Premedicated. Marland Kitchen His right groin was prepped and shaved in usual sterile fashion. Xylocaine 1% was used for local anesthesia. A 6 French sheath was inserted into the right common femoral  artery using standard Seldinger technique.  Ultrasound was used to identify the vessel and guide access.  Digital image was captured and placed the patient's chart.  A 5 French left Judkins diagnostic catheter, AL-1 diagnostic catheter and pigtail catheters were used for selective coronary angiography and left ventriculography respectively.  The RCA was anterior takeoff.  Isovue dye was used for the entirety of the case.  Retrograde aorta, ventricular and pullback pressures were recorded. The infarct-related artery was the first  obtuse marginal branch which had a 99% ostial stenosis.  This is a previously stented vessel by Dr. Ellyn Hack 1 year ago.  There was TIMI II flow.  Patient received 10,000 units of heparin with an ACT of approximate 275.  Isovue dye was used for the entirety of the case.  The patient received Brilinta 180 mg p.o. Using a 6 French XB 3.5 mm guide catheter with a 0.14 [place] guidewire the first obtuse marginal branch  was wired with some difficulty.  The origin of the LM branch was then predilated with a 2 mm x 12 mm balloon and stented with a 2.25 mm x 12 mm long Medtronic Onyx resolute drug-eluting stent carefully positioned at the ostium deployed at 14 atm (2.3 mm) resulting reduction of a 95% ostial OM1 stenosis to 0% residual.  Patient tolerated procedure well.  The guidewire and catheter were removed and the sheath was secured in place.   Successful OM1 ostial PCI drug-eluting stenting in the setting of inferolateral STEMI.  The patient was asymptomatic upon presentation and throughout the case.  There was TIMI-2 flow initially and TIMI-3 flow at the end of the case.  This was the only lesion that could potentially be implicated with the EKG changes.  The RCA was widely patent.  It did have an anterior takeoff.  The LAD had diffuse apical disease.  The EF appeared normal with hand-injection.  The patient will need dual antiplatelet therapy uninterrupted for at least 12 months.  2D echo will be obtained.  Aggressive risk factor modification will be recommended.  The patient did have severe hyper glycemia which will need to be addressed during his hospitalization. Quay Burow. MD, [facility] 03/22/2020 1:15 AM   DG Chest Portable 1 View  Result Date: 03/21/2020 CLINICAL DATA:  Hyperglycemia. STEMI on EKG. EXAM: PORTABLE CHEST 1 VIEW COMPARISON:  01/05/2020 FINDINGS: Stable mild cardiomegaly. Unchanged mediastinal contours with aortic tortuosity. Minor bibasilar atelectasis. No pulmonary edema. No confluent  consolidation. No pleural fluid or pneumothorax. No acute osseous abnormalities are seen. IMPRESSION: Stable mild cardiomegaly. Minor bibasilar atelectasis. Electronically Signed   By: Keith Rake M.D.   On: 03/21/2020 23:05   ECHOCARDIOGRAM COMPLETE  Result Date: 03/22/2020    ECHOCARDIOGRAM REPORT   Patient Name:   Keith [name] Date of Exam: 03/22/2020 Medical Rec #:  [id number]      Height:       69.0 in Accession #:    [id number]     Weight:       260.6 lb Date of Birth:  [birth year]      BSA:          2.312 m Patient Age:    21 years       BP:           106/79 mmHg Patient Gender: M              HR:           77 bpm. Exam Location:  Inpatient Procedure: 2D Echo, Cardiac Doppler, Color Doppler and Intracardiac            Opacification Agent Indications:    Acute MI  History:        Patient has prior history of Echocardiogram examinations, most                 recent 11/11/2018. CAD; Risk Factors:Diabetes, Hypertension and                 Dyslipidemia. CKD, HIV.  Sonographer:    Dustin Flock Referring Phys: [id number] [facility] Rosendo Gros  Sonographer Comments: Technically difficult study due to poor echo windows and patient is morbidly obese. Image acquisition challenging due to uncooperative patient. IMPRESSIONS  1. Technically difficult study with poor acoustical windows and patient cooperation even with definity contrast. Left ventricular ejection fraction, by estimation, is 55 to 60%. The left ventricle has normal function. Left ventricular endocardial border  not optimally defined  to evaluate regional wall motion but grossly appear normal. There is mild concentric left ventricular hypertrophy. Left ventricular diastolic parameters are consistent with Grade I diastolic dysfunction (impaired relaxation).  2. Right ventricular systolic function is normal. The right ventricular size is normal. Tricuspid regurgitation signal is inadequate for assessing PA pressure.  3. The mitral valve is normal in  structure. No evidence of mitral valve regurgitation. No evidence of mitral stenosis.  4. The aortic valve is normal in structure. Aortic valve regurgitation is not visualized. No aortic stenosis is present.  5. Aortic dilatation noted. There is mild dilatation of the ascending aorta and of the aortic root, measuring 38 mm.  6. The inferior vena cava is normal in size with greater than 50% respiratory variability, suggesting right atrial pressure of 3 mmHg. FINDINGS  Left Ventricle: Left ventricular ejection fraction, by estimation, is 55 to 60%. The left ventricle has normal function. Left ventricular endocardial border not optimally defined to evaluate regional wall motion. Definity contrast agent was given IV to delineate the left ventricular endocardial borders. The left ventricular internal cavity size was normal in size. There is mild concentric left ventricular hypertrophy. Left ventricular diastolic parameters are consistent with Grade I diastolic dysfunction (impaired relaxation). Normal left ventricular filling pressure. Right Ventricle: The right ventricular size is normal. No increase in right ventricular wall thickness. Right ventricular systolic function is normal. Tricuspid regurgitation signal is inadequate for assessing PA pressure. Left Atrium: Left atrial size was normal in size. Right Atrium: Right atrial size was normal in size. Pericardium: There is no evidence of pericardial effusion. Mitral Valve: The mitral valve is normal in structure. No evidence of mitral valve regurgitation. No evidence of mitral valve stenosis. Tricuspid Valve: The tricuspid valve is normal in structure. Tricuspid valve regurgitation is not demonstrated. No evidence of tricuspid stenosis. Aortic Valve: The aortic valve is normal in structure. Aortic valve regurgitation is not visualized. No aortic stenosis is present. Pulmonic Valve: The pulmonic valve was normal in structure. Pulmonic valve regurgitation is not  visualized. No evidence of pulmonic stenosis. Aorta: Aortic dilatation noted. There is mild dilatation of the ascending aorta and of the aortic root, measuring 38 mm. Venous: The inferior vena cava is normal in size with greater than 50% respiratory variability, suggesting right atrial pressure of 3 mmHg. IAS/Shunts: No atrial level shunt detected by color flow Doppler.  LEFT VENTRICLE PLAX 2D LVIDd:         5.40 cm  Diastology LVIDs:         3.40 cm  LV e' medial:    6.09 cm/s LV PW:         1.40 cm  LV E/e' medial:  7.2 LV IVS:        1.40 cm  LV e' lateral:   7.62 cm/s LVOT diam:     2.10 cm  LV E/e' lateral: 5.8 LV SV:         44 LV SV Index:   19 LVOT Area:     3.46 cm  RIGHT VENTRICLE RV Basal diam:  3.30 cm RV S prime:     7.40 cm/s TAPSE (M-mode): 2.0 cm LEFT ATRIUM             Index       RIGHT ATRIUM           Index LA diam:        2.60 cm 1.12 cm/m  RA Area:     12.20 cm LA Vol (  A2C):   56.5 ml 24.44 ml/m RA Volume:   28.40 ml  12.28 ml/m LA Vol (A4C):   47.7 ml 20.63 ml/m LA Biplane Vol: 53.2 ml 23.01 ml/m  AORTIC VALVE LVOT Vmax:   80.90 cm/s LVOT Vmean:  46.400 cm/s LVOT VTI:    0.127 m  AORTA Ao Root diam: 3.80 cm MITRAL VALVE MV Area (PHT): 2.87 cm    SHUNTS MV Decel Time: 264 msec    Systemic VTI:  0.13 m MV E velocity: 44.10 cm/s  Systemic Diam: 2.10 cm MV A velocity: 59.60 cm/s MV E/A ratio:  0.74 Fransico Him MD Electronically signed by Fransico Him MD Signature Date/Time: 03/22/2020/11:59:06 AM    Final     Cardiac Studies   Cath: 03/22/20   Mid Cx lesion is 60% stenosed.  1st Mrg-1 lesion is 95% stenosed.  Previously placed 1st Mrg-2 stent (unknown type) is widely patent.  Dist LAD-1 lesion is 80% stenosed.  Dist LAD-2 lesion is 90% stenosed.  Dist LAD-3 lesion is 90% stenosed.  Dist RCA lesion is 50% stenosed.  The left ventricular systolic function is normal.  LV end diastolic pressure is normal.  The left ventricular ejection fraction is 50-55% by visual  estimate.  IMPRESSION: Successful OM1 ostial PCI drug-eluting stenting in the setting of inferolateral STEMI.  The patient was asymptomatic upon presentation and throughout the case.  There was TIMI-2 flow initially and TIMI-3 flow at the end of the case.  This was the only lesion that could potentially be implicated with the EKG changes.  The RCA was widely patent.  It did have an anterior takeoff.  The LAD had diffuse apical disease.  The EF appeared normal with hand-injection.  The patient will need dual antiplatelet therapy uninterrupted for at least 12 months.  2D echo will be obtained.  Aggressive risk factor modification will be recommended.  The patient did have severe hyper glycemia which will need to be addressed during his hospitalization.  Quay Burow. MD, [facility] 03/22/2020  Diagnostic Dominance: Right    Echo: 03/22/20  IMPRESSIONS    1. Technically difficult study with poor acoustical windows and patient  cooperation even with definity contrast. Left ventricular ejection  fraction, by estimation, is 55 to 60%. The left ventricle has normal  function. Left ventricular endocardial border  not optimally defined to evaluate regional wall motion but grossly appear  normal. There is mild concentric left ventricular hypertrophy. Left  ventricular diastolic parameters are consistent with Grade I diastolic  dysfunction (impaired relaxation).  2. Right ventricular systolic function is normal. The right ventricular  size is normal. Tricuspid regurgitation signal is inadequate for assessing  PA pressure.  3. The mitral valve is normal in structure. No evidence of mitral valve  regurgitation. No evidence of mitral stenosis.  4. The aortic valve is normal in structure. Aortic valve regurgitation is  not visualized. No aortic stenosis is present.  5. Aortic dilatation noted. There is mild dilatation of the ascending  aorta and of the aortic root, measuring 38 mm.  6. The  inferior vena cava is normal in size with greater than 50%  respiratory variability, suggesting right atrial pressure of 3 mmHg.   Patient Profile     53 y.o. male with a history of uncontrolled type 2 DM, CAD (with stenting of OM1 11/2018),HIV,right BKA, CKD, chronic back pain, diabetic neuropathy, onychomycosis, hyperlipidemia, and hypertension, who presented to the hospital with multiple symptoms after running out of his insulin 1 week ago.  Denied any chest pain but Ecg on admit showed evidence of acute inferolateral STEMI  Assessment & Plan    1. Acute Inferolateral STEMI: actually no chest pain at this time on arrival but EKG showed ST elevation in inferolateral leads. Underwent cardiac cath with restenosis of stent in first OM felt to be the culprit. Treated with PCI/DESx1. Placed on DAPT with ASA/Brilinta for one year. hsTn peaked at 1427. He does have residual disease in the dLAD which is not felt to be amenable to PCI. -- on ASA/Brilinta, statin and BB  2. Severe hyperglycemia with uncontrolled DM: reports he ran out of his insulin prior to admission. CBG 1027 on admission.  -- remains on insulin gtt, along with lantus 30 units and IVF -- management via Internal Medicine  3. HLD: on high dose statin, non-complaint prior to admission. -- LDL 199  -- trigs 310, consider starting vascepa at discharge   4. HTN: controlled  5. Hyponatremia: Na+ 119>>127>>131.   6. CKD stage III: Cr baseline around 2.0-2.5, up to 3.87 this morning. Suspect related to contrast dye post cath.  -- will as nephrology to evaluate today -- daily BMET, currently on no renal insulting agents  7. HIV: on Biktarvy  For questions or updates, please contact [place] Please consult [email or web address] for contact info under        Signed, Reino Bellis, NP  03/23/2020, 7:57 AM

## 2020-03-23 NOTE — Consult Note (Signed)
Reason for Consult: Acute kidney injury chronic kidney disease stage III Referring Physician: Quay Burow, MD (cardiology)  HPI:  [name] with past medical history significant for poorly controlled type 2 diabetes mellitus, HIV infection on antiretroviral treatment, peripheral vascular disease status post right BKA, history of coronary artery disease status post MI with PCI/DES to [place] in August, 2020, hypertension and chronic kidney disease stage III (baseline creatinine appears 2.3-2.5).  Presented to the emergency room with polyuria, polydipsia, nausea and ST elevation MI (without chest pain) after running out of insulin for the past week (suspected longer with hemoglobin A1c greater than 15.5%).  He was treated as a code STEMI and immediately started on heparin with emergent cardiac catheterization performed on 12/15 with repeat DES to OM1.  He has had significant hyperglycemia since admission and [facility] service was consulted with assistance with glycemic management.  Overnight, creatinine has risen from 2.3-3.9 with 1 L urine output charted overnight.  Past Medical History:  Diagnosis Date  . Abscess of right foot    abscess/ulcer of R transtibial amputation requiring IV abx  . AIDS ([place])   . Chronic anemia   . Chronic knee pain    right  . Chronic pain   . CKD (chronic kidney disease), stage II   . Diabetes type 2, uncontrolled ([place])    HgA1c 17.6 (04/27/2010)  . Diabetic foot ulcer ([place]) 01/2017   right foot  . Erectile dysfunction   . Genital warts   . HIV (human immunodeficiency virus infection) ([place]) 2009   CD4 count 100, VL 13800 (05/01/2010)  . Hypertension   . Osteomyelitis ([place])    h/o hand  . Osteomyelitis of metatarsal ([place]) 04/28/2017    Past Surgical History:  Procedure Laterality Date  . AMPUTATION Right 05/02/2017   Procedure: AMPUTATION TRANSMETARSAL;  Surgeon: [name], MD;  Location: [facility];  Service: Orthopedics;   Laterality: Right;  . AMPUTATION Right 06/13/2017   Procedure: RIGHT BELOW KNEE AMPUTATION;  Surgeon: [name], MD;  Location: [facility];  Service: Orthopedics;  Laterality: Right;  . BELOW KNEE LEG AMPUTATION Right 06/13/2017  . BELOW KNEE LEG AMPUTATION    . CORONARY STENT INTERVENTION N/A 11/10/2018   Procedure: CORONARY STENT INTERVENTION;  Surgeon: Leonie Man, MD;  Location: [facility];  Service: Cardiovascular;  Laterality: N/A;  . CORONARY/GRAFT ACUTE MI REVASCULARIZATION N/A 03/21/2020   Procedure: Coronary/Graft Acute MI Revascularization;  Surgeon: [name], MD;  Location: [facility];  Service: Cardiovascular;  Laterality: N/A;  . HERNIA REPAIR    . I & D EXTREMITY Left 08/21/2014   Procedure: INCISION AND DRAINAGE LEFT SMALL FINGER;  Surgeon: Leanora Cover, MD;  Location: [facility];  Service: Orthopedics;  Laterality: Left;  . I & D EXTREMITY Right 03/18/2017   Procedure: IRRIGATION AND DEBRIDEMENT EXTREMITY;  Surgeon: [name], MD;  Location: [facility];  Service: Orthopedics;  Laterality: Right;  . LEFT HEART CATH AND CORONARY ANGIOGRAPHY N/A 11/10/2018   Procedure: LEFT HEART CATH AND CORONARY ANGIOGRAPHY;  Surgeon: Leonie Man, MD;  Location: [facility];  Service: Cardiovascular;  Laterality: N/A;  . LEFT HEART CATH AND CORONARY ANGIOGRAPHY N/A 03/21/2020   Procedure: LEFT HEART CATH AND CORONARY ANGIOGRAPHY;  Surgeon: [name], MD;  Location: [facility];  Service: Cardiovascular;  Laterality: N/A;  . MINOR IRRIGATION AND DEBRIDEMENT OF WOUND Right 04/22/2014   Procedure: IRRIGATION AND DEBRIDEMENT OF RIGHT NECK  ABCESS;  Surgeon: Jerrell Belfast, MD;  Location: [facility];  Service: ENT;  Laterality: Right;  . MULTIPLE EXTRACTIONS WITH ALVEOLOPLASTY N/A 01/18/2013   Procedure: MULTIPLE EXTRACION 3, 6, 7, 10, 11, 13, 21, 22, 27, 28, 29, 30 WITH ALVEOLOPLASTY;  Surgeon: Gae Bon, DDS;  Location: [place] [facility];  Service: Oral Surgery;   Laterality: N/A;  . SKIN SPLIT GRAFT Right 03/21/2017   Procedure: IRRIGATION AND DEBRIDEMENT RIGHT FOOT AND APPLY SPLIT THICKNESS SKIN GRAFT AND WOUND VAC;  Surgeon: [name], MD;  Location: [facility];  Service: Orthopedics;  Laterality: Right;  . TEE WITHOUT CARDIOVERSION N/A 05/02/2017   Procedure: TRANSESOPHAGEAL ECHOCARDIOGRAM (TEE);  Surgeon: Acie Fredrickson Wonda Cheng, MD;  [facility];  Service: Cardiovascular;  Laterality: N/A;  coincidental to orthopedic case    Family History  Problem Relation Age of Onset  . Hypertension Mother   . Arthritis Father   . Hypertension Father   . Hypertension Brother   . Cancer Maternal Grandmother 70       unknown type of cancer  . Depression Paternal Grandmother     Social History:  reports that he has never smoked. He has never used smokeless tobacco. He reports previous alcohol use. He reports previous drug use.  Allergies:  Allergies  Allergen Reactions  . Sulfur Itching    Patient stated he's allergic to "sulfur" AND "sulfa"  . Sulfa Antibiotics Itching    Medications:  Scheduled: . aspirin  81 mg Oral Daily  . atorvastatin  80 mg Oral Daily  . bictegravir-emtricitabine-tenofovir AF  1 tablet Oral Daily  . carvedilol  25 mg Oral BID WC  . Chlorhexidine Gluconate Cloth  6 each Topical Q0600  . dapsone  100 mg Oral Daily  . heparin injection (subcutaneous)  5,000 Units Subcutaneous Q8H  . insulin glargine  38 Units Subcutaneous Daily  . mupirocin ointment  1 application Nasal BID  . sodium chloride flush  3 mL Intravenous Q12H  . ticagrelor  90 mg Oral BID    BMP Latest Ref Rng & Units 03/23/2020 03/22/2020 03/21/2020  Glucose 70 - 99 mg/dL 241(H) 387(H) 761(HH)  BUN 6 - 20 mg/dL 38(H) 29(H) 30(H)  Creatinine 0.61 - 1.24 mg/dL 3.87(H) 2.29(H) 2.33(H)  BUN/Creat Ratio 6 - 22 (calc) - - -  Sodium 135 - 145 mmol/L 131(L) 127(L) 120(L)  Potassium 3.5 - 5.1 mmol/L 3.8 3.8 4.8  Chloride 98 - 111 mmol/L 96(L) 92(L) 87(L)  CO2 22 -  32 mmol/L 23 21(L) 22  Calcium 8.9 - 10.3 mg/dL 9.2 9.0 9.5   CBC Latest Ref Rng & Units 03/22/2020 03/21/2020 03/21/2020  WBC 4.0 - 10.5 K/uL 6.4 - 6.1  Hemoglobin 13.0 - 17.0 g/dL 11.0(L) 15.0 12.5(L)  Hematocrit 39.0 - 52.0 % 31.7(L) 44.0 38.2(L)  Platelets 150 - 400 K/uL 251 - 244     CARDIAC CATHETERIZATION  Result Date: 03/22/2020  Mid Cx lesion is 60% stenosed.  1st Mrg-1 lesion is 95% stenosed.  Previously placed 1st Mrg-2 stent (unknown type) is widely patent.  Dist LAD-1 lesion is 80% stenosed.  Dist LAD-2 lesion is 90% stenosed.  Dist LAD-3 lesion is 90% stenosed.  Dist RCA lesion is 50% stenosed.  The left ventricular systolic function is normal.  LV end diastolic pressure is normal.  The left ventricular ejection fraction is 50-55% by visual estimate.  Keith [name] is a 54 y.o. male  [id number] LOCATION:  FACILITY: [facility] PHYSICIAN: Quay Burow, M.D. [birth year] DATE OF PROCEDURE:  03/22/2020 DATE OF DISCHARGE: CARDIAC CATHETERIZATION / PCI DES OM1 History obtained from chart review.Keith [name] is a 54 y.o. male with a history of uncontrolled type 2 DM, CAD (with stenting of OM1 11/2018), HIV, right BKA, CKD, chronic back pain, diabetic neuropathy, onychomycosis, hyperlipidemia, and hypertension, who presented to the hospital with multiple symptoms after running out of his insulin 1 week ago. He has had nausea, vomiting, blurry vision, abdominal pain, diarrhea and polyuria. He has been consuming large amounts of fluids. For some reason he also drank "[facility]" since his friend advised him to take it. He himself is a poor historian.  He had a cardiac catheterization procedure in August 2020 when a stent was placed in the OM1 branch. He was also documented to have severe diffuse disease (90%) in the distal LAD. He does not recall taking his ASA recently. He also gives vague answers regarding his other medications.  During the current hospitalization, in the ED, the vital  signs were: 141/117 mmHg, HR 90 bpm. The labs were:  NA 122, K 5.5, Gluc >700, BUN 36, Cr 2.1, Hgb 15, and Hct 44.  The ECG showed acute ST elevations in the inferior and anterolateral leads PROCEDURE DESCRIPTION: The patient was brought to the second floor [facility]iac cath lab in the postabsorptive state. He was not Premedicated. Marland Kitchen His right groin was prepped and shaved in usual sterile fashion. Xylocaine 1% was used for local anesthesia. A 6 French sheath was inserted into the right common femoral  artery using standard Seldinger technique.  Ultrasound was used to identify the vessel and guide access.  Digital image was captured and placed the patient's chart.  A 5 French left Judkins diagnostic catheter, AL-1 diagnostic catheter and pigtail catheters were used for selective coronary angiography and left ventriculography respectively.  The RCA was anterior takeoff.  Isovue dye was used for the entirety of the case.  Retrograde aorta, ventricular and pullback pressures were recorded. The infarct-related artery was the first obtuse marginal branch which had a 99% ostial stenosis.  This is a previously stented vessel by Dr. Ellyn Hack 1 year ago.  There was TIMI II flow.  Patient received 10,000 units of heparin with an ACT of approximate 275.  Isovue dye was used for the entirety of the case.  The patient received Brilinta 180 mg p.o. Using a 6 French XB 3.5 mm guide catheter with a 0.14 [place] guidewire the first obtuse marginal branch was wired with some difficulty.  The origin of the LM branch was then predilated with a 2 mm x 12 mm balloon and stented with a 2.25 mm x 12 mm long Medtronic Onyx resolute drug-eluting stent carefully positioned at the ostium deployed at 14 atm (2.3 mm) resulting reduction of a 95% ostial OM1 stenosis to 0% residual.  Patient tolerated procedure well.  The guidewire and catheter were removed and the sheath was secured in place.   Successful OM1 ostial PCI drug-eluting  stenting in the setting of inferolateral STEMI.  The patient was asymptomatic upon presentation and throughout the case.  There was TIMI-2 flow initially and TIMI-3 flow at the end of the case.  This was the only lesion that could potentially be implicated with the EKG changes.  The RCA was widely patent.  It did have an anterior takeoff.  The LAD had diffuse apical disease.  The EF appeared normal with hand-injection.  The patient will need dual antiplatelet therapy uninterrupted for at least 12 months.  2D echo will be obtained.  Aggressive risk factor modification will be recommended.  The patient did have severe hyper glycemia which will need to be addressed during his hospitalization. Quay Burow. MD, [facility] 03/22/2020 1:15 AM   DG Chest Portable 1 View  Result Date: 03/21/2020 CLINICAL DATA:  Hyperglycemia. STEMI on EKG. EXAM: PORTABLE CHEST 1 VIEW COMPARISON:  01/05/2020 FINDINGS: Stable mild cardiomegaly. Unchanged mediastinal contours with aortic tortuosity. Minor bibasilar atelectasis. No pulmonary edema. No confluent consolidation. No pleural fluid or pneumothorax. No acute osseous abnormalities are seen. IMPRESSION: Stable mild cardiomegaly. Minor bibasilar atelectasis. Electronically Signed   By: Keith Rake M.D.   On: 03/21/2020 23:05   ECHOCARDIOGRAM COMPLETE  Result Date: 03/22/2020    ECHOCARDIOGRAM REPORT   Patient Name:   Keith [name] Phillis Date of Exam: 03/22/2020 Medical Rec #:  [id number]      Height:       69.0 in Accession #:    [id number]     Weight:       260.6 lb Date of Birth:  1965-09-27      BSA:          2.312 m Patient Age:    70 years       BP:           106/79 mmHg Patient Gender: M              HR:           77 bpm. Exam Location:  Inpatient Procedure: 2D Echo, Cardiac Doppler, Color Doppler and Intracardiac            Opacification Agent Indications:    Acute MI  History:        Patient has prior history of Echocardiogram examinations, most                 recent  11/11/2018. CAD; Risk Factors:Diabetes, Hypertension and                 Dyslipidemia. CKD, HIV.  Sonographer:    Dustin Flock Referring Phys: [id number] [facility] Rosendo Gros  Sonographer Comments: Technically difficult study due to poor echo windows and patient is morbidly obese. Image acquisition challenging due to uncooperative patient. IMPRESSIONS  1. Technically difficult study with poor acoustical windows and patient cooperation even with definity contrast. Left ventricular ejection fraction, by estimation, is 55 to 60%. The left ventricle has normal function. Left ventricular endocardial border  not optimally defined to evaluate regional wall motion but grossly appear normal. There is mild concentric left ventricular hypertrophy. Left ventricular diastolic parameters are consistent with Grade I diastolic dysfunction (impaired relaxation).  2. Right ventricular systolic function is normal. The right ventricular size is normal. Tricuspid regurgitation signal is inadequate for assessing PA pressure.  3. The mitral valve is normal in structure. No evidence of mitral valve regurgitation. No evidence of mitral stenosis.  4. The aortic valve is normal in structure. Aortic valve regurgitation is not visualized. No aortic stenosis is present.  5. Aortic dilatation noted. There is mild dilatation of the ascending aorta and of the aortic root, measuring 38 mm.  6. The inferior vena cava is normal in size with greater than 50% respiratory variability, suggesting right atrial pressure of 3 mmHg. FINDINGS  Left Ventricle: Left ventricular ejection fraction, by estimation, is 55 to 60%. The left ventricle has normal function. Left ventricular endocardial border not optimally defined to evaluate regional wall motion. Definity contrast agent was given IV to delineate  the left ventricular endocardial borders. The left ventricular internal cavity size was normal in size. There is mild concentric left ventricular hypertrophy. Left  ventricular diastolic parameters are consistent with Grade I diastolic dysfunction (impaired relaxation). Normal left ventricular filling pressure. Right Ventricle: The right ventricular size is normal. No increase in right ventricular wall thickness. Right ventricular systolic function is normal. Tricuspid regurgitation signal is inadequate for assessing PA pressure. Left Atrium: Left atrial size was normal in size. Right Atrium: Right atrial size was normal in size. Pericardium: There is no evidence of pericardial effusion. Mitral Valve: The mitral valve is normal in structure. No evidence of mitral valve regurgitation. No evidence of mitral valve stenosis. Tricuspid Valve: The tricuspid valve is normal in structure. Tricuspid valve regurgitation is not demonstrated. No evidence of tricuspid stenosis. Aortic Valve: The aortic valve is normal in structure. Aortic valve regurgitation is not visualized. No aortic stenosis is present. Pulmonic Valve: The pulmonic valve was normal in structure. Pulmonic valve regurgitation is not visualized. No evidence of pulmonic stenosis. Aorta: Aortic dilatation noted. There is mild dilatation of the ascending aorta and of the aortic root, measuring 38 mm. Venous: The inferior vena cava is normal in size with greater than 50% respiratory variability, suggesting right atrial pressure of 3 mmHg. IAS/Shunts: No atrial level shunt detected by color flow Doppler.  LEFT VENTRICLE PLAX 2D LVIDd:         5.40 cm  Diastology LVIDs:         3.40 cm  LV e' medial:    6.09 cm/s LV PW:         1.40 cm  LV E/e' medial:  7.2 LV IVS:        1.40 cm  LV e' lateral:   7.62 cm/s LVOT diam:     2.10 cm  LV E/e' lateral: 5.8 LV SV:         44 LV SV Index:   19 LVOT Area:     3.46 cm  RIGHT VENTRICLE RV Basal diam:  3.30 cm RV S prime:     7.40 cm/s TAPSE (M-mode): 2.0 cm LEFT ATRIUM             Index       RIGHT ATRIUM           Index LA diam:        2.60 cm 1.12 cm/m  RA Area:     12.20 cm LA Vol  (A2C):   56.5 ml 24.44 ml/m RA Volume:   28.40 ml  12.28 ml/m LA Vol (A4C):   47.7 ml 20.63 ml/m LA Biplane Vol: 53.2 ml 23.01 ml/m  AORTIC VALVE LVOT Vmax:   80.90 cm/s LVOT Vmean:  46.400 cm/s LVOT VTI:    0.127 m  AORTA Ao Root diam: 3.80 cm MITRAL VALVE MV Area (PHT): 2.87 cm    SHUNTS MV Decel Time: 264 msec    Systemic VTI:  0.13 m MV E velocity: 44.10 cm/s  Systemic Diam: 2.10 cm MV A velocity: 59.60 cm/s MV E/A ratio:  0.74 Armanda Magic MD Electronically signed by Armanda Magic MD Signature Date/Time: 03/22/2020/11:59:06 AM    Final     Review of Systems  Constitutional: Positive for appetite change and fatigue. Negative for chills and fever.  HENT: Negative for mouth sores, nosebleeds, sinus pressure, sinus pain and sore throat.   Eyes: Negative for pain, redness and itching.  Respiratory: Negative for cough, chest tightness, shortness of breath and wheezing.  Cardiovascular: Negative for chest pain and leg swelling.  Gastrointestinal: Negative for abdominal pain, blood in stool, diarrhea, nausea and vomiting.  Endocrine: Positive for polydipsia and polyuria.  Genitourinary: Positive for frequency and urgency. Negative for dysuria and hematuria.  Musculoskeletal: Positive for back pain and myalgias. Negative for gait problem and joint swelling.  Skin: Negative for rash.  Neurological: Positive for dizziness, weakness and light-headedness.  Psychiatric/Behavioral: Negative for confusion.   Blood pressure 110/74, pulse 67, temperature 98.5 F (36.9 C), temperature source Oral, resp. rate 14, height 5\' 9"  (1.753 m), weight 119.7 kg, SpO2 99 %. Physical Exam Vitals and nursing note reviewed.  Constitutional:      Appearance: Normal appearance. He is obese. He is ill-appearing.  HENT:     Head: Normocephalic and atraumatic.     Right Ear: External ear normal.     Left Ear: External ear normal.     Nose: Nose normal.     Mouth/Throat:     Mouth: Mucous membranes are dry.      Pharynx: Oropharynx is clear. No oropharyngeal exudate.  Eyes:     Extraocular Movements: Extraocular movements intact.     Conjunctiva/sclera: Conjunctivae normal.     Pupils: Pupils are equal, round, and reactive to light.  Cardiovascular:     Rate and Rhythm: Normal rate and regular rhythm.     Pulses: Normal pulses.     Heart sounds: Normal heart sounds. No murmur heard.   Pulmonary:     Effort: Pulmonary effort is normal.     Breath sounds: Normal breath sounds. No wheezing or rales.  Abdominal:     General: Bowel sounds are normal.     Palpations: Abdomen is soft.     Tenderness: There is no abdominal tenderness. There is no guarding.  Musculoskeletal:     Cervical back: Normal range of motion and neck supple. No rigidity.     Left lower leg: No edema.     Comments: Status post right BKA  Lymphadenopathy:     Cervical: No cervical adenopathy.  Skin:    General: Skin is warm and dry.     Coloration: Skin is not pale.     Findings: No erythema.  Neurological:     Mental Status: He is alert and oriented to person, place, and time.  Psychiatric:        Mood and Affect: Mood normal.     Assessment/Plan: 1.  Acute kidney injury on chronic kidney disease stage III: This appears to be likely contrast induced nephropathy in the setting of poorly controlled diabetes mellitus prior to admission (likely leading to volume contraction from osmotic diuresis).  I agree with isotonic fluids at this time (LR rate increased to 125 cc an hour) and will check a renal ultrasound along with urine electrolytes to help corroborate clinical suspicion.  He does not have any acute electrolyte abnormality or uremic signs or symptoms prompting need for dialysis at this time. Avoid nephrotoxic medications including NSAIDs and iodinated intravenous contrast exposure unless the latter is absolutely indicated.  Preferred narcotic agents for pain control are hydromorphone, fentanyl, and methadone. Morphine  should not be used. Avoid Baclofen and avoid oral sodium phosphate and magnesium citrate based laxatives / bowel preps. Continue strict Input and Output monitoring. Will monitor the patient closely with you and intervene or adjust therapy as indicated by changes in clinical status/labs. 2.  ST elevation MI: Clinically stable status post emergent coronary angiography and drug-eluting stent placement to OM1.  Additional management per cardiology. 3.  Insulin-dependent diabetes mellitus: Poorly controlled with significantly elevated hemoglobin A1c.  Management per hospitalist service. 4.  Hypertension: Blood pressures currently at goal and without signs of volume overload. 5.  HIV infection: Resume antiretroviral therapy, adherence questionable with low CD4 count/viral load.  Danashia Landers K. 03/23/2020, 12:10 PM

## 2020-03-23 NOTE — Progress Notes (Signed)
Results for BRACY, PEPPER (MRN [id number]) as of 03/23/2020 13:58  Ref. Range 03/23/2020 08:41 03/23/2020 10:08 03/23/2020 11:08 03/23/2020 12:05 03/23/2020 13:19  Glucose-Capillary Latest Ref Range: 70 - 99 mg/dL 247 (H) 229 (H) 212 (H) 216 (H) 327 (H)  Noted that patient is still on IV insulin. Received Lantus 38 units at 10:41 this am. CBGs are still >200 mg/dl.   Recommend Novolog MODERATE correction scale every 4 hours started at the time the IV insulin is stopped. Continue every 4 hours while NPO and then TID & HS scale. May need Novolog 3 units TID with meals if eating at least 50% of meals.   [name] RN BSN CDE Diabetes Coordinator Pager: [phone]  8am-5pm

## 2020-03-23 NOTE — Progress Notes (Signed)
[facility] PROGRESS NOTE    DIETRICK [name]  VGV:[id number] DOB: 02/25/1966 DOA: 03/21/2020 PCP: Kallie Locks, FNP      Brief Narrative:  Keith [name] is a 54 y.o. male with medical history significant of HIV on HAART, R BKA, CKD now progressed to stage 4 , DM2, HTN.  History of MI in 11/2018, got OM1 stent placement with DES at that time. Pt ran out of needles / insulin so hasnt been taking insulin for a 1 week prior to admission.   Presented to ED with c/o nausea, vomiting, blurry vision, abd pain, polyuria, and polydipsia.  Symptoms are severe, not helped by drinking mountain dew .  He was noted to have an acute STEMI.  Past Medical History:  Diagnosis Date  . Abscess of right foot    abscess/ulcer of R transtibial amputation requiring IV abx  . AIDS (HCC)   . Chronic anemia   . Chronic knee pain    right  . Chronic pain   . CKD (chronic kidney disease), stage II   . Diabetes type 2, uncontrolled (HCC)    HgA1c 17.6 (04/27/2010)  . Diabetic foot ulcer (HCC) 01/2017   right foot  . Erectile dysfunction   . Genital warts   . HIV (human immunodeficiency virus infection) (HCC) 2009   CD4 count 100, VL 13800 (05/01/2010)  . Hypertension   . Osteomyelitis (HCC)    h/o hand  . Osteomyelitis of metatarsal (HCC) 04/28/2017      Assessment & Plan:  Acute STEMI - S/p drug-eluting stent to OM1, just proximal to the 11/2018 stent he had put in OM1.  Currently on dual antiplatelets.  Cardiology following.  Acute kidney injury on chronic kidney disease stage IV.  Likely secondary to contrast nephropathy.  Will continue with IV fluids.  Nephrology consult appreciated.  DM2 with hyperglycemia      Initially required insulin drip but currently his blood glucose is improving.  Resumed preadmission Lantus and increased dose.  Patient reported that he ran out of Lantus prior to presenting to the ED hospital.  Continue with short-acting insulin coverage   HIV  - Continue HAART Not clear if he is compliant with this at home.  Added to admission dapsone. CD4 count in May was only 89, and viral load was 7280  HTN - Continue with current medications.  Pressure Injury 11/10/18 Leg Right Stage II -  Partial thickness loss of dermis presenting as a shallow open ulcer with a red, pink wound bed without slough. (Active)  11/10/18 1857  Location: Leg  Location Orientation: Right  Staging: Stage II -  Partial thickness loss of dermis presenting as a shallow open ulcer with a red, pink wound bed without slough.  Wound Description (Comments):   Present on Admission: Yes        . aspirin  81 mg Oral Daily  . atorvastatin  80 mg Oral Daily  . bictegravir-emtricitabine-tenofovir AF  1 tablet Oral Daily  . carvedilol  25 mg Oral BID WC  . Chlorhexidine Gluconate Cloth  6 each Topical Q0600  . dapsone  100 mg Oral Daily  . heparin injection (subcutaneous)  5,000 Units Subcutaneous Q8H  . insulin glargine  38 Units Subcutaneous Daily  . mupirocin ointment  1 application Nasal BID  . sodium chloride flush  3 mL Intravenous Q12H  . ticagrelor  90 mg Oral BID     Current Meds  Medication Sig  . atorvastatin (LIPITOR)  80 MG tablet Take 1 tablet (80 mg total) by mouth daily at 6 PM.  . BIKTARVY 50-200-25 MG TABS tablet TAKE 1 TABLET BY MOUTH DAILY. (Patient taking differently: Take 1 tablet by mouth daily.)  . insulin aspart (NOVOLOG FLEXPEN) 100 UNIT/ML FlexPen Inject 10 units under skin 3 times a day, with meals, for blood glucose levels greater than 125.  Marland Kitchen LEVEMIR 100 UNIT/ML injection Inject 0.2 mLs (20 Units total) into the skin daily.  Marland Kitchen oxyCODONE 10 MG TABS Take 1 tablet (10 mg total) by mouth daily as needed for up to 5 doses for severe pain or breakthrough pain (pain).        Disposition: Status is: Inpatient  Remains inpatient appropriate because:Persistent severe electrolyte disturbances   Dispo: The patient is from: Home               Anticipated d/c is to: Home              Anticipated d/c date is: 2 days              Patient currently is not medically stable to d/c.              MDM: The below labs and imaging reports were reviewed and summarized above.  Medication management as above.     DVT prophylaxis: heparin injection 5,000 Units Start: 03/23/20 1400  Code Status: Full code Family Communication: Discussed with patient   Consultants:   Cardiology, nephrology  Procedures:   Status post PCI and stent placement     Subjective: Patient reports that he feels better this morning except for intermittent nausea.  Denies chest pain.  Objective: Lying in bed, no form of pain or respiratory distress Vitals:   03/22/20 1400 03/22/20 1459 03/22/20 2033 03/23/20 0400  BP: 98/83 (!) 92/53 113/84 110/74  Pulse: 64 73 73 67  Resp: $Remo'13 18 18 14  'GczuP$ Temp:  98.5 F (36.9 C) 98.6 F (37 C) 98.5 F (36.9 C)  TempSrc:  Oral Oral Oral  SpO2: 100% 97% 99% 99%  Weight:  119.7 kg    Height:  $Remove'5\' 9"'zSbdLpf$  (1.753 m)      Intake/Output Summary (Last 24 hours) at 03/23/2020 1546 Last data filed at 03/23/2020 0635 Gross per 24 hour  Intake 389.38 ml  Output 600 ml  Net -210.62 ml   Filed Weights   03/22/20 0028 03/22/20 0130 03/22/20 1459  Weight: 122.5 kg 118.2 kg 119.7 kg    Examination: General: Not in obvious distress, alert awake . HENT:   No scleral pallor or icterus noted. Oral mucosa is moist.  Chest:   Clear to auscultation bilaterally. No crackles or wheezes.  CVS: S1 &S2 heard. No murmur.  Regular rate and rhythm. Abdomen: Soft, nontender, nondistended.  Bowel sounds are normoactive. Extremities: No cyanosis, right BKA.  Psych: Alert, awake.  Normal mood and affect CNS:  No cranial nerve deficits.  Power equal in all extremities.   Skin: Warm and dry.  No rashes noted.    Data Reviewed: I have personally reviewed following labs and imaging studies:  CBC: Recent Labs  Lab  April 03, 2020 1411 April 03, 2020 2109 03/22/20 0312  WBC 6.1  --  6.4  HGB 12.5* 15.0 11.0*  HCT 38.2* 44.0 31.7*  MCV 83.4  --  77.9*  PLT 244  --  881   Basic Metabolic Panel: Recent Labs  Lab 04/03/20 1411 04/03/20 2109 04-03-20 2232 03/22/20 0312 03/23/20 0213  NA 119* 122* 120*  127* 131*  K 5.5* 5.5* 4.8 3.8 3.8  CL 83* 88* 87* 92* 96*  CO2 22  --  22 21* 23  GLUCOSE 1,027* >700* 761* 387* 241*  BUN 29* 36* 30* 29* 38*  CREATININE 2.29* 2.10* 2.33* 2.29* 3.87*  CALCIUM 9.4  --  9.5 9.0 9.2   GFR: Estimated Creatinine Clearance: 27.5 mL/min (A) (by C-G formula based on SCr of 3.87 mg/dL (H)). Liver Function Tests: Recent Labs  Lab 03/21/20 2232  AST 25  ALT 17  ALKPHOS 97  BILITOT 0.6  PROT 8.9*  ALBUMIN 3.1*   No results for input(s): LIPASE, AMYLASE in the last 168 hours. No results for input(s): AMMONIA in the last 168 hours. Coagulation Profile: Recent Labs  Lab 03/21/20 2232  INR 1.0   Cardiac Enzymes: No results for input(s): CKTOTAL, CKMB, CKMBINDEX, TROPONINI in the last 168 hours. BNP (last 3 results) No results for input(s): PROBNP in the last 8760 hours. HbA1C: Recent Labs    03/21/20 2241  HGBA1C >15.5*   CBG: Recent Labs  Lab 03/23/20 1108 03/23/20 1205 03/23/20 1319 03/23/20 1419 03/23/20 1520  GLUCAP 212* 216* 327* 146* 172*   Lipid Profile: Recent Labs    03/21/20 2241  CHOL 291*  HDL 30*  LDLCALC 199*  TRIG 310*  CHOLHDL 9.7   Thyroid Function Tests: No results for input(s): TSH, T4TOTAL, FREET4, T3FREE, THYROIDAB in the last 72 hours. Anemia Panel: No results for input(s): VITAMINB12, FOLATE, FERRITIN, TIBC, IRON, RETICCTPCT in the last 72 hours. Urine analysis:    Component Value Date/Time   COLORURINE STRAW (A) 03/21/2020 1851   APPEARANCEUR CLEAR 03/21/2020 1851   LABSPEC 1.022 03/21/2020 1851   PHURINE 6.0 03/21/2020 1851   GLUCOSEU >=500 (A) 03/21/2020 1851   HGBUR NEGATIVE 03/21/2020 1851   BILIRUBINUR  NEGATIVE 03/21/2020 1851   BILIRUBINUR negative 09/07/2018 [place] 03/21/2020 1851   PROTEINUR 100 (A) 03/21/2020 1851   UROBILINOGEN 1.0 09/07/2018 1034   UROBILINOGEN 1.0 01/01/2017 1032   NITRITE NEGATIVE 03/21/2020 1851   LEUKOCYTESUR NEGATIVE 03/21/2020 1851   Sepsis Labs: $RemoveBefo'@[id number]'[id number]$ (procalcitonin:4,lacticacidven:4)  ) Recent Results (from the past 240 hour(s))  Resp Panel by RT-PCR (Flu A&B, Covid) Nasopharyngeal Swab     Status: None   Collection Time: 03/21/20 10:41 PM   Specimen: Nasopharyngeal Swab; Nasopharyngeal(NP) swabs in vial transport medium  Result Value Ref Range Status   SARS Coronavirus 2 by RT PCR NEGATIVE NEGATIVE Final    Comment: (NOTE) SARS-CoV-2 target nucleic acids are NOT DETECTED.  The SARS-CoV-2 RNA is generally detectable in upper respiratory specimens during the acute phase of infection. The lowest concentration of SARS-CoV-2 viral copies this assay can detect is 138 copies/mL. A negative result does not preclude SARS-Cov-2 infection and should not be used as the sole basis for treatment or other patient management decisions. A negative result may occur with  improper specimen collection/handling, submission of specimen other than nasopharyngeal swab, presence of viral mutation(s) within the areas targeted by this assay, and inadequate number of viral copies(<138 copies/mL). A negative result must be combined with clinical observations, patient history, and epidemiological information. The expected result is Negative.  Fact Sheet for Patients:  EntrepreneurPulse.com.au  Fact Sheet for Healthcare Providers:  IncredibleEmployment.be  This test is no t yet approved or cleared by the [place] FDA and  has been authorized for detection and/or diagnosis of SARS-CoV-2 by FDA under an Emergency Use Authorization (EUA). This EUA will remain  in  effect (meaning this test can be used) for the  duration of the COVID-19 declaration under Section 564(b)(1) of the Act, 21 U.S.C.section 360bbb-3(b)(1), unless the authorization is terminated  or revoked sooner.       Influenza A by PCR NEGATIVE NEGATIVE Final   Influenza B by PCR NEGATIVE NEGATIVE Final    Comment: (NOTE) The Xpert Xpress SARS-CoV-2/FLU/RSV plus assay is intended as an aid in the diagnosis of influenza from Nasopharyngeal swab specimens and should not be used as a sole basis for treatment. Nasal washings and aspirates are unacceptable for Xpert Xpress SARS-CoV-2/FLU/RSV testing.  Fact Sheet for Patients: BloggerCourse.com  Fact Sheet for Healthcare Providers: SeriousBroker.it  This test is not yet approved or cleared by the [place] FDA and has been authorized for detection and/or diagnosis of SARS-CoV-2 by FDA under an Emergency Use Authorization (EUA). This EUA will remain in effect (meaning this test can be used) for the duration of the COVID-19 declaration under Section 564(b)(1) of the Act, 21 U.S.C. section 360bbb-3(b)(1), unless the authorization is terminated or revoked.  Performed at [facility], [address], [place], [place] [address]   MRSA PCR Screening     Status: Abnormal   Collection Time: 03/22/20  1:46 AM   Specimen: Nasal Mucosa; Nasopharyngeal  Result Value Ref Range Status   MRSA by PCR POSITIVE (A) NEGATIVE Final    Comment:        The GeneXpert MRSA Assay (FDA approved for NASAL specimens only), is one component of a comprehensive MRSA colonization surveillance program. It is not intended to diagnose MRSA infection nor to guide or monitor treatment for MRSA infections. RESULT CALLED TO, READ BACK BY AND VERIFIED WITH: H BOPE RN 03/22/20 0416 JDW Performed at [facility], [address], [place], [place] [address]          Radiology Studies: CARDIAC CATHETERIZATION  Result Date: 03/22/2020   Mid Cx lesion is 60% stenosed.  1st Mrg-1 lesion is 95% stenosed.  Previously placed 1st Mrg-2 stent (unknown type) is widely patent.  Dist LAD-1 lesion is 80% stenosed.  Dist LAD-2 lesion is 90% stenosed.  Dist LAD-3 lesion is 90% stenosed.  Dist RCA lesion is 50% stenosed.  The left ventricular systolic function is normal.  LV end diastolic pressure is normal.  The left ventricular ejection fraction is 50-55% by visual estimate.  SOLOMON SKOWRONEK is a 54 y.o. male  [id number] LOCATION:  FACILITY: [facility] PHYSICIAN: Nanetta Batty, M.D. [birth year] DATE OF PROCEDURE:  03/22/2020 DATE OF DISCHARGE: CARDIAC CATHETERIZATION / PCI DES OM1 History obtained from chart review.CURTEZ BRALLIER is a 54 y.o. male with a history of uncontrolled type 2 DM, CAD (with stenting of OM1 11/2018), HIV, right BKA, CKD, chronic back pain, diabetic neuropathy, onychomycosis, hyperlipidemia, and hypertension, who presented to the hospital with multiple symptoms after running out of his insulin 1 week ago. He has had nausea, vomiting, blurry vision, abdominal pain, diarrhea and polyuria. He has been consuming large amounts of fluids. For some reason he also drank "[facility]" since his friend advised him to take it. He himself is a poor historian.  He had a cardiac catheterization procedure in August 2020 when a stent was placed in the OM1 branch. He was also documented to have severe diffuse disease (90%) in the distal LAD. He does not recall taking his ASA recently. He also gives vague answers regarding his other medications.  During the current hospitalization, in the ED,  the vital signs were: 141/117 mmHg, HR 90 bpm. The labs were:  NA 122, K 5.5, Gluc >700, BUN 36, Cr 2.1, Hgb 15, and Hct 44.  The ECG showed acute ST elevations in the inferior and anterolateral leads PROCEDURE DESCRIPTION: The patient was brought to the second floor [facility] lab in the postabsorptive state. He was not Premedicated. Marland Kitchen His right  groin was prepped and shaved in usual sterile fashion. Xylocaine 1% was used for local anesthesia. A 6 French sheath was inserted into the right common femoral  artery using standard Seldinger technique.  Ultrasound was used to identify the vessel and guide access.  Digital image was captured and placed the patient's chart.  A 5 French left Judkins diagnostic catheter, AL-1 diagnostic catheter and pigtail catheters were used for selective coronary angiography and left ventriculography respectively.  The RCA was anterior takeoff.  Isovue dye was used for the entirety of the case.  Retrograde aorta, ventricular and pullback pressures were recorded. The infarct-related artery was the first obtuse marginal branch which had a 99% ostial stenosis.  This is a previously stented vessel by Dr. Ellyn Hack 1 year ago.  There was TIMI II flow.  Patient received 10,000 units of heparin with an ACT of approximate 275.  Isovue dye was used for the entirety of the case.  The patient received Brilinta 180 mg p.o. Using a 6 French XB 3.5 mm guide catheter with a 0.14 [place] guidewire the first obtuse marginal branch was wired with some difficulty.  The origin of the LM branch was then predilated with a 2 mm x 12 mm balloon and stented with a 2.25 mm x 12 mm long Medtronic Onyx resolute drug-eluting stent carefully positioned at the ostium deployed at 14 atm (2.3 mm) resulting reduction of a 95% ostial OM1 stenosis to 0% residual.  Patient tolerated procedure well.  The guidewire and catheter were removed and the sheath was secured in place.   Successful OM1 ostial PCI drug-eluting stenting in the setting of inferolateral STEMI.  The patient was asymptomatic upon presentation and throughout the case.  There was TIMI-2 flow initially and TIMI-3 flow at the end of the case.  This was the only lesion that could potentially be implicated with the EKG changes.  The RCA was widely patent.  It did have an anterior takeoff.  The LAD had  diffuse apical disease.  The EF appeared normal with hand-injection.  The patient will need dual antiplatelet therapy uninterrupted for at least 12 months.  2D echo will be obtained.  Aggressive risk factor modification will be recommended.  The patient did have severe hyper glycemia which will need to be addressed during his hospitalization. Quay Burow. MD, [facility] 03/22/2020 1:15 AM   DG Chest Portable 1 View  Result Date: 03/21/2020 CLINICAL DATA:  Hyperglycemia. STEMI on EKG. EXAM: PORTABLE CHEST 1 VIEW COMPARISON:  01/05/2020 FINDINGS: Stable mild cardiomegaly. Unchanged mediastinal contours with aortic tortuosity. Minor bibasilar atelectasis. No pulmonary edema. No confluent consolidation. No pleural fluid or pneumothorax. No acute osseous abnormalities are seen. IMPRESSION: Stable mild cardiomegaly. Minor bibasilar atelectasis. Electronically Signed   By: Keith Rake M.D.   On: 03/21/2020 23:05   ECHOCARDIOGRAM COMPLETE  Result Date: 03/22/2020    ECHOCARDIOGRAM REPORT   Patient Name:   Keith [name] [place] Date of Exam: 03/22/2020 Medical Rec #:  [id number]      Height:       69.0 in Accession #:    [id number]  Weight:       260.6 lb Date of Birth:  19-Jul-1965      BSA:          2.312 m Patient Age:    64 years       BP:           106/79 mmHg Patient Gender: M              HR:           77 bpm. Exam Location:  Inpatient Procedure: 2D Echo, Cardiac Doppler, Color Doppler and Intracardiac            Opacification Agent Indications:    Acute MI  History:        Patient has prior history of Echocardiogram examinations, most                 recent 11/11/2018. CAD; Risk Factors:Diabetes, Hypertension and                 Dyslipidemia. CKD, HIV.  Sonographer:    Dustin Flock Referring Phys: [id number] [facility] Rosendo Gros  Sonographer Comments: Technically difficult study due to poor echo windows and patient is morbidly obese. Image acquisition challenging due to uncooperative patient. IMPRESSIONS  1.  Technically difficult study with poor acoustical windows and patient cooperation even with definity contrast. Left ventricular ejection fraction, by estimation, is 55 to 60%. The left ventricle has normal function. Left ventricular endocardial border  not optimally defined to evaluate regional wall motion but grossly appear normal. There is mild concentric left ventricular hypertrophy. Left ventricular diastolic parameters are consistent with Grade I diastolic dysfunction (impaired relaxation).  2. Right ventricular systolic function is normal. The right ventricular size is normal. Tricuspid regurgitation signal is inadequate for assessing PA pressure.  3. The mitral valve is normal in structure. No evidence of mitral valve regurgitation. No evidence of mitral stenosis.  4. The aortic valve is normal in structure. Aortic valve regurgitation is not visualized. No aortic stenosis is present.  5. Aortic dilatation noted. There is mild dilatation of the ascending aorta and of the aortic root, measuring 38 mm.  6. The inferior vena cava is normal in size with greater than 50% respiratory variability, suggesting right atrial pressure of 3 mmHg. FINDINGS  Left Ventricle: Left ventricular ejection fraction, by estimation, is 55 to 60%. The left ventricle has normal function. Left ventricular endocardial border not optimally defined to evaluate regional wall motion. Definity contrast agent was given IV to delineate the left ventricular endocardial borders. The left ventricular internal cavity size was normal in size. There is mild concentric left ventricular hypertrophy. Left ventricular diastolic parameters are consistent with Grade I diastolic dysfunction (impaired relaxation). Normal left ventricular filling pressure. Right Ventricle: The right ventricular size is normal. No increase in right ventricular wall thickness. Right ventricular systolic function is normal. Tricuspid regurgitation signal is inadequate for  assessing PA pressure. Left Atrium: Left atrial size was normal in size. Right Atrium: Right atrial size was normal in size. Pericardium: There is no evidence of pericardial effusion. Mitral Valve: The mitral valve is normal in structure. No evidence of mitral valve regurgitation. No evidence of mitral valve stenosis. Tricuspid Valve: The tricuspid valve is normal in structure. Tricuspid valve regurgitation is not demonstrated. No evidence of tricuspid stenosis. Aortic Valve: The aortic valve is normal in structure. Aortic valve regurgitation is not visualized. No aortic stenosis is present. Pulmonic Valve: The pulmonic valve was normal in structure. Pulmonic valve  regurgitation is not visualized. No evidence of pulmonic stenosis. Aorta: Aortic dilatation noted. There is mild dilatation of the ascending aorta and of the aortic root, measuring 38 mm. Venous: The inferior vena cava is normal in size with greater than 50% respiratory variability, suggesting right atrial pressure of 3 mmHg. IAS/Shunts: No atrial level shunt detected by color flow Doppler.  LEFT VENTRICLE PLAX 2D LVIDd:         5.40 cm  Diastology LVIDs:         3.40 cm  LV e' medial:    6.09 cm/s LV PW:         1.40 cm  LV E/e' medial:  7.2 LV IVS:        1.40 cm  LV e' lateral:   7.62 cm/s LVOT diam:     2.10 cm  LV E/e' lateral: 5.8 LV SV:         44 LV SV Index:   19 LVOT Area:     3.46 cm  RIGHT VENTRICLE RV Basal diam:  3.30 cm RV S prime:     7.40 cm/s TAPSE (M-mode): 2.0 cm LEFT ATRIUM             Index       RIGHT ATRIUM           Index LA diam:        2.60 cm 1.12 cm/m  RA Area:     12.20 cm LA Vol (A2C):   56.5 ml 24.44 ml/m RA Volume:   28.40 ml  12.28 ml/m LA Vol (A4C):   47.7 ml 20.63 ml/m LA Biplane Vol: 53.2 ml 23.01 ml/m  AORTIC VALVE LVOT Vmax:   80.90 cm/s LVOT Vmean:  46.400 cm/s LVOT VTI:    0.127 m  AORTA Ao Root diam: 3.80 cm MITRAL VALVE MV Area (PHT): 2.87 cm    SHUNTS MV Decel Time: 264 msec    Systemic VTI:  0.13 m MV E  velocity: 44.10 cm/s  Systemic Diam: 2.10 cm MV A velocity: 59.60 cm/s MV E/A ratio:  0.74 Fransico Him MD Electronically signed by Fransico Him MD Signature Date/Time: 03/22/2020/11:59:06 AM    Final         Scheduled Meds: . aspirin  81 mg Oral Daily  . atorvastatin  80 mg Oral Daily  . bictegravir-emtricitabine-tenofovir AF  1 tablet Oral Daily  . carvedilol  25 mg Oral BID WC  . Chlorhexidine Gluconate Cloth  6 each Topical Q0600  . dapsone  100 mg Oral Daily  . heparin injection (subcutaneous)  5,000 Units Subcutaneous Q8H  . insulin glargine  38 Units Subcutaneous Daily  . mupirocin ointment  1 application Nasal BID  . sodium chloride flush  3 mL Intravenous Q12H  . ticagrelor  90 mg Oral BID   Continuous Infusions: . sodium chloride    . dextrose 5% lactated ringers 75 mL/hr at 03/23/20 0208  . insulin Stopped (03/23/20 1421)  . lactated ringers 125 mL/hr at 03/23/20 1323     LOS: 1 day    Time spent: 25 minutes    Davarius Ridener Manning Charity, MD [facility] 03/23/2020, 3:46 PM     Please page though [employer] or Epic secure chat:  For [employer], Adult nurse

## 2020-03-23 NOTE — Progress Notes (Signed)
CBG 146 at 1420. Insulin drip paused for 1 hr per endotool order.

## 2020-03-24 LAB — RENAL FUNCTION PANEL
Albumin: 2.4 g/dL — ABNORMAL LOW (ref 3.5–5.0)
Anion gap: 12 (ref 5–15)
BUN: 44 mg/dL — ABNORMAL HIGH (ref 6–20)
CO2: 20 mmol/L — ABNORMAL LOW (ref 22–32)
Calcium: 8.6 mg/dL — ABNORMAL LOW (ref 8.9–10.3)
Chloride: 99 mmol/L (ref 98–111)
Creatinine, Ser: 4.4 mg/dL — ABNORMAL HIGH (ref 0.61–1.24)
GFR, Estimated: 15 mL/min — ABNORMAL LOW (ref >60)
Glucose, Bld: 313 mg/dL — ABNORMAL HIGH (ref 70–99)
Phosphorus: 4.6 mg/dL (ref 2.5–4.6)
Potassium: 3.9 mmol/L (ref 3.5–5.1)
Sodium: 131 mmol/L — ABNORMAL LOW (ref 135–145)

## 2020-03-24 LAB — GLUCOSE, CAPILLARY
Glucose-Capillary: 260 mg/dL — ABNORMAL HIGH (ref 70–99)
Glucose-Capillary: 253 mg/dL — ABNORMAL HIGH (ref 70–99)
Glucose-Capillary: 219 mg/dL — ABNORMAL HIGH (ref 70–99)
Glucose-Capillary: 360 mg/dL — ABNORMAL HIGH (ref 70–99)

## 2020-03-24 LAB — CBC
HCT: 32.3 % — ABNORMAL LOW (ref 39.0–52.0)
Hemoglobin: 10.6 g/dL — ABNORMAL LOW (ref 13.0–17.0)
MCH: 26.4 pg (ref 26.0–34.0)
MCHC: 32.8 g/dL (ref 30.0–36.0)
MCV: 80.5 fL (ref 80.0–100.0)
Platelets: 239 10*3/uL (ref 150–400)
RBC: 4.01 MIL/uL — ABNORMAL LOW (ref 4.22–5.81)
RDW: 12.1 % (ref 11.5–15.5)
WBC: 6.9 10*3/uL (ref 4.0–10.5)
nRBC: 0 % (ref 0.0–0.2)

## 2020-03-24 MED ORDER — INSULIN GLARGINE 100 UNIT/ML ~~LOC~~ SOLN
42.0000 [IU] | Freq: Every day | SUBCUTANEOUS | Status: DC
  Administered 2020-03-25 – 2020-03-26 (×2): 42 [IU] via SUBCUTANEOUS
  Filled 2020-03-24 (×2): qty 0.42

## 2020-03-24 MED ORDER — OXYCODONE HCL 5 MG PO TABS
10.0000 mg | ORAL_TABLET | Freq: Four times a day (QID) | ORAL | Status: DC | PRN
  Administered 2020-03-25 – 2020-03-26 (×4): 10 mg via ORAL
  Filled 2020-03-24 (×4): qty 2

## 2020-03-24 MED ORDER — OXYCODONE HCL 10 MG PO TABS: 10.0000 mg | ORAL_TABLET | Freq: Every day | ORAL | Status: DC | PRN

## 2020-03-24 MED ORDER — AMLODIPINE BESYLATE 5 MG PO TABS
5.0000 mg | ORAL_TABLET | Freq: Every day | ORAL | Status: DC
  Administered 2020-03-25 – 2020-03-26 (×2): 5 mg via ORAL
  Filled 2020-03-24 (×2): qty 1

## 2020-03-24 MED ORDER — SODIUM CHLORIDE 0.9 % IV SOLN: Freq: Once | INTRAVENOUS | Status: AC

## 2020-03-24 NOTE — Evaluation (Signed)
Occupational Therapy Evaluation Patient Details Name: Keith [name] MRN: [id number] DOB: 17-Jan-1966 Today's Date: 03/24/2020    History of Present Illness Pt adm with STEMI and underwent emergent heart cath with stent placement. PMH - DM, CAD, HIV, rt BKA, CKD, back pain, HTN, diabetic neuropathy.   Clinical Impression   Pt doing well and is at Ind - Mod I level with ADLs and ADL mobility. No further acute OT services are indicated at this time. OT will sign off    Follow Up Recommendations  No OT follow up    Equipment Recommendations  None recommended by OT    Recommendations for Other Services       Precautions / Restrictions Precautions Precautions: None Restrictions Weight Bearing Restrictions: No      Mobility Bed Mobility Overal bed mobility: Independent                  Transfers Overall transfer level: Modified independent Equipment used: None             General transfer comment: Prosthetic donned EOB independently    Balance Overall balance assessment: Mild deficits observed, not formally tested                                         ADL either performed or assessed with clinical judgement   ADL Overall ADL's : Modified independent                                             Vision Patient Visual Report: No change from baseline       Perception     Praxis      Pertinent Vitals/Pain Pain Assessment: Faces Faces Pain Scale: Hurts a little bit Pain Location: R groin Pain Descriptors / Indicators: Sore Pain Intervention(s): Premedicated before session;Monitored during session;Relaxation     Hand Dominance Right   Extremity/Trunk Assessment Upper Extremity Assessment Upper Extremity Assessment: Overall WFL for tasks assessed   Lower Extremity Assessment Lower Extremity Assessment: Defer to PT evaluation RLE Deficits / Details: BKA       Communication Communication Communication: No  difficulties   Cognition Arousal/Alertness: Awake/alert Behavior During Therapy: WFL for tasks assessed/performed Overall Cognitive Status: Within Functional Limits for tasks assessed                                     General Comments  RHR-73. HR after amb 85    Exercises     Shoulder Instructions      Home Living Family/patient expects to be discharged to:: Private residence Living Arrangements: Children Available Help at Discharge: Family Type of Home: Apartment Home Access: Stairs to enter Technical brewer of Steps: 2 Entrance Stairs-Rails: Right;Left;Can reach both Home Layout: One level     Bathroom Shower/Tub: Teacher, early years/pre: Standard Bathroom Accessibility: Yes   Home Equipment: Environmental consultant - 2 wheels          Prior Functioning/Environment Level of Independence: Independent with assistive device(s)        Comments: uses prosthetic        OT Problem List: Decreased activity tolerance      OT Treatment/Interventions:  OT Goals(Current goals can be found in the care plan section) Acute Rehab OT Goals Patient Stated Goal: go home OT Goal Formulation: With patient  OT Frequency:     Barriers to D/C:            Co-evaluation              AM-PAC OT "6 Clicks" Daily Activity     Outcome Measure Help from another person eating meals?: None Help from another person taking care of personal grooming?: None Help from another person toileting, which includes using toliet, bedpan, or urinal?: None Help from another person bathing (including washing, rinsing, drying)?: None Help from another person to put on and taking off regular upper body clothing?: None Help from another person to put on and taking off regular lower body clothing?: None 6 Click Score: 24   End of Session    Activity Tolerance: Patient tolerated treatment well Patient left: in bed;with call bell/phone within reach (sitting EOB)  OT  Visit Diagnosis: Other abnormalities of gait and mobility (R26.89)                Time: 7183-6725 OT Time Calculation (min): 15 min Charges:  OT General Charges $OT Visit: 1 Visit OT Evaluation $OT Eval Moderate Complexity: 1 Mod    Britt Bottom 03/24/2020, 12:46 PM

## 2020-03-24 NOTE — Progress Notes (Signed)
Progress Note  Patient Name: Keith [name] Date of Encounter: 03/24/2020  [facility] Cardiologist: Larae Grooms, MD   Subjective   Pt complains of right groin pain last night - has not yet walked the halls  Inpatient Medications    Scheduled Meds: . aspirin  81 mg Oral Daily  . atorvastatin  80 mg Oral Daily  . bictegravir-emtricitabine-tenofovir AF  1 tablet Oral Daily  . carvedilol  25 mg Oral BID WC  . Chlorhexidine Gluconate Cloth  6 each Topical Q0600  . dapsone  100 mg Oral Daily  . heparin injection (subcutaneous)  5,000 Units Subcutaneous Q8H  . insulin aspart  0-15 Units Subcutaneous TID WC  . insulin aspart  0-5 Units Subcutaneous QHS  . insulin glargine  38 Units Subcutaneous Daily  . mupirocin ointment  1 application Nasal BID  . sodium chloride flush  3 mL Intravenous Q12H  . ticagrelor  90 mg Oral BID   Continuous Infusions: . sodium chloride    . lactated ringers 125 mL/hr at 03/23/20 1323   PRN Meds: sodium chloride, acetaminophen, dextrose, morphine injection, nitroGLYCERIN, ondansetron (ZOFRAN) IV, sodium chloride flush   Vital Signs    Vitals:   03/23/20 1400 03/23/20 1805 03/23/20 2114 03/24/20 0558  BP: 113/79 140/88 130/80 (!) 151/97  Pulse:   74 70  Resp:   19 18  Temp:   98.4 F (36.9 C) 97.8 F (36.6 C)  TempSrc:   Oral Oral  SpO2:      Weight:    122.5 kg  Height:        Intake/Output Summary (Last 24 hours) at 03/24/2020 0809 Last data filed at 03/23/2020 1828 Gross per 24 hour  Intake 1785.14 ml  Output 100 ml  Net 1685.14 ml   Last 3 Weights 03/24/2020 03/22/2020 03/22/2020  Weight (lbs) 270 lb 1 oz 264 lb 260 lb 9.3 oz  Weight (kg) 122.5 kg 119.75 kg 118.2 kg  Some encounter information is confidential and restricted. Go to Review Flowsheets activity to see all data.      Telemetry    Sinus rhythm with HR 70s - Personally Reviewed  ECG    No new tracings - Personally Reviewed  Physical Exam   GEN:  obese male in no acute distress.   Neck: No JVD Cardiac: RRR, no murmurs, rubs, or gallops.  Respiratory: Clear to auscultation bilaterally. GI: Soft, nontender, non-distended  MS: right BKA Neuro:  Nonfocal  Psych: Normal affect  Right groin site soft, no hematoma  Labs    High Sensitivity Troponin:   Recent Labs  Lab 03/21/20 2232 03/22/20 0312  TROPONINIHS 1,065* 1,427*      Chemistry Recent Labs  Lab 03/21/20 2232 03/22/20 0312 03/23/20 0213 03/24/20 0211  NA 120* 127* 131* 131*  K 4.8 3.8 3.8 3.9  CL 87* 92* 96* 99  CO2 22 21* 23 20*  GLUCOSE 761* 387* 241* 313*  BUN 30* 29* 38* 44*  CREATININE 2.33* 2.29* 3.87* 4.40*  CALCIUM 9.5 9.0 9.2 8.6*  PROT 8.9*  --   --   --   ALBUMIN 3.1*  --   --  2.4*  AST 25  --   --   --   ALT 17  --   --   --   ALKPHOS 97  --   --   --   BILITOT 0.6  --   --   --   GFRNONAA 32* 33* 18* 15*  ANIONGAP  11 14 12 12      Hematology Recent Labs  Lab 03/21/20 1411 03/21/20 2109 03/22/20 0312 03/24/20 0211  WBC 6.1  --  6.4 6.9  RBC 4.58  --  4.07* 4.01*  HGB 12.5* 15.0 11.0* 10.6*  HCT 38.2* 44.0 31.7* 32.3*  MCV 83.4  --  77.9* 80.5  MCH 27.3  --  27.0 26.4  MCHC 32.7  --  34.7 32.8  RDW 12.6  --  11.9 12.1  PLT 244  --  251 239    BNPNo results for input(s): BNP, PROBNP in the last 168 hours.   DDimer No results for input(s): DDIMER in the last 168 hours.   Radiology    US RENAL  Result Date: 03/23/2020 CLINICAL DATA:  Acute kidney injury EXAM: RENAL / URINARY TRACT ULTRASOUND COMPLETE COMPARISON:  Ultrasound 10/21/2018 FINDINGS: Right Kidney: Renal measurements: 12.8 x 6 x 5.2 cm = volume: 207.3 mL. Cortex is slightly echogenic. No mass or hydronephrosis. Left Kidney: Renal measurements: 12.2 x 5.8 x 5.2 cm = volume: 192.1 mL. Cortex appears slightly echogenic. No mass or hydronephrosis. Bladder: Appears normal for degree of bladder distention. Other: None. IMPRESSION: 1. Slightly echogenic kidneys suggestive of  medical renal disease. 2. No hydronephrosis Electronically Signed   By: Donavan Foil M.D.   On: 03/23/2020 18:03   ECHOCARDIOGRAM COMPLETE  Result Date: 03/22/2020    ECHOCARDIOGRAM REPORT   Patient Name:   Keith [name] Date of Exam: 03/22/2020 Medical Rec #:  [id number]      Height:       69.0 in Accession #:    [id number]     Weight:       260.6 lb Date of Birth:  11-02-1965      BSA:          2.312 m Patient Age:    54 years       BP:           106/79 mmHg Patient Gender: M              HR:           77 bpm. Exam Location:  Inpatient Procedure: 2D Echo, Cardiac Doppler, Color Doppler and Intracardiac            Opacification Agent Indications:    Acute MI  History:        Patient has prior history of Echocardiogram examinations, most                 recent 11/11/2018. CAD; Risk Factors:Diabetes, Hypertension and                 Dyslipidemia. CKD, HIV.  Sonographer:    Dustin Flock Referring Phys: [id number] [facility] Rosendo Gros  Sonographer Comments: Technically difficult study due to poor echo windows and patient is morbidly obese. Image acquisition challenging due to uncooperative patient. IMPRESSIONS  1. Technically difficult study with poor acoustical windows and patient cooperation even with definity contrast. Left ventricular ejection fraction, by estimation, is 55 to 60%. The left ventricle has normal function. Left ventricular endocardial border  not optimally defined to evaluate regional wall motion but grossly appear normal. There is mild concentric left ventricular hypertrophy. Left ventricular diastolic parameters are consistent with Grade I diastolic dysfunction (impaired relaxation).  2. Right ventricular systolic function is normal. The right ventricular size is normal. Tricuspid regurgitation signal is inadequate for assessing PA pressure.  3. The mitral valve is normal in structure. No evidence  of mitral valve regurgitation. No evidence of mitral stenosis.  4. The aortic valve is normal in  structure. Aortic valve regurgitation is not visualized. No aortic stenosis is present.  5. Aortic dilatation noted. There is mild dilatation of the ascending aorta and of the aortic root, measuring 38 mm.  6. The inferior vena cava is normal in size with greater than 50% respiratory variability, suggesting right atrial pressure of 3 mmHg. FINDINGS  Left Ventricle: Left ventricular ejection fraction, by estimation, is 55 to 60%. The left ventricle has normal function. Left ventricular endocardial border not optimally defined to evaluate regional wall motion. Definity contrast agent was given IV to delineate the left ventricular endocardial borders. The left ventricular internal cavity size was normal in size. There is mild concentric left ventricular hypertrophy. Left ventricular diastolic parameters are consistent with Grade I diastolic dysfunction (impaired relaxation). Normal left ventricular filling pressure. Right Ventricle: The right ventricular size is normal. No increase in right ventricular wall thickness. Right ventricular systolic function is normal. Tricuspid regurgitation signal is inadequate for assessing PA pressure. Left Atrium: Left atrial size was normal in size. Right Atrium: Right atrial size was normal in size. Pericardium: There is no evidence of pericardial effusion. Mitral Valve: The mitral valve is normal in structure. No evidence of mitral valve regurgitation. No evidence of mitral valve stenosis. Tricuspid Valve: The tricuspid valve is normal in structure. Tricuspid valve regurgitation is not demonstrated. No evidence of tricuspid stenosis. Aortic Valve: The aortic valve is normal in structure. Aortic valve regurgitation is not visualized. No aortic stenosis is present. Pulmonic Valve: The pulmonic valve was normal in structure. Pulmonic valve regurgitation is not visualized. No evidence of pulmonic stenosis. Aorta: Aortic dilatation noted. There is mild dilatation of the ascending aorta  and of the aortic root, measuring 38 mm. Venous: The inferior vena cava is normal in size with greater than 50% respiratory variability, suggesting right atrial pressure of 3 mmHg. IAS/Shunts: No atrial level shunt detected by color flow Doppler.  LEFT VENTRICLE PLAX 2D LVIDd:         5.40 cm  Diastology LVIDs:         3.40 cm  LV e' medial:    6.09 cm/s LV PW:         1.40 cm  LV E/e' medial:  7.2 LV IVS:        1.40 cm  LV e' lateral:   7.62 cm/s LVOT diam:     2.10 cm  LV E/e' lateral: 5.8 LV SV:         44 LV SV Index:   19 LVOT Area:     3.46 cm  RIGHT VENTRICLE RV Basal diam:  3.30 cm RV S prime:     7.40 cm/s TAPSE (M-mode): 2.0 cm LEFT ATRIUM             Index       RIGHT ATRIUM           Index LA diam:        2.60 cm 1.12 cm/m  RA Area:     12.20 cm LA Vol (A2C):   56.5 ml 24.44 ml/m RA Volume:   28.40 ml  12.28 ml/m LA Vol (A4C):   47.7 ml 20.63 ml/m LA Biplane Vol: 53.2 ml 23.01 ml/m  AORTIC VALVE LVOT Vmax:   80.90 cm/s LVOT Vmean:  46.400 cm/s LVOT VTI:    0.127 m  AORTA Ao Root diam: 3.80 cm MITRAL VALVE MV  Area (PHT): 2.87 cm    SHUNTS MV Decel Time: 264 msec    Systemic VTI:  0.13 m MV E velocity: 44.10 cm/s  Systemic Diam: 2.10 cm MV A velocity: 59.60 cm/s MV E/A ratio:  0.74 Fransico Him MD Electronically signed by Fransico Him MD Signature Date/Time: 03/22/2020/11:59:06 AM    Final     Cardiac Studies   Left heart cath 03/22/20:  Mid Cx lesion is 60% stenosed.  1st Mrg-1 lesion is 95% stenosed.  Previously placed 1st Mrg-2 stent (unknown type) is widely patent.  Dist LAD-1 lesion is 80% stenosed.  Dist LAD-2 lesion is 90% stenosed.  Dist LAD-3 lesion is 90% stenosed.  Dist RCA lesion is 50% stenosed.  The left ventricular systolic function is normal.  LV end diastolic pressure is normal.  The left ventricular ejection fraction is 50-55% by visual estimate.  IMPRESSION: Successful OM1 ostial PCI drug-eluting stenting in the setting of inferolateral STEMI.  The  patient was asymptomatic upon presentation and throughout the case.  There was TIMI-2 flow initially and TIMI-3 flow at the end of the case.  This was the only lesion that could potentially be implicated with the EKG changes.  The RCA was widely patent.  It did have an anterior takeoff.  The LAD had diffuse apical disease.  The EF appeared normal with hand-injection.  The patient will need dual antiplatelet therapy uninterrupted for at least 12 months.  2D echo will be obtained.  Aggressive risk factor modification will be recommended.  The patient did have severe hyper glycemia which will need to be addressed during his hospitalization.   Echo 03/22/20: 1. Technically difficult study with poor acoustical windows and patient  cooperation even with definity contrast. Left ventricular ejection  fraction, by estimation, is 55 to 60%. The left ventricle has normal  function. Left ventricular endocardial border  not optimally defined to evaluate regional wall motion but grossly appear  normal. There is mild concentric left ventricular hypertrophy. Left  ventricular diastolic parameters are consistent with Grade I diastolic  dysfunction (impaired relaxation).  2. Right ventricular systolic function is normal. The right ventricular  size is normal. Tricuspid regurgitation signal is inadequate for assessing  PA pressure.  3. The mitral valve is normal in structure. No evidence of mitral valve  regurgitation. No evidence of mitral stenosis.  4. The aortic valve is normal in structure. Aortic valve regurgitation is  not visualized. No aortic stenosis is present.  5. Aortic dilatation noted. There is mild dilatation of the ascending  aorta and of the aortic root, measuring 38 mm.  6. The inferior vena cava is normal in size with greater than 50%  respiratory variability, suggesting right atrial pressure of 3 mmHg.     Patient Profile     54 y.o. male with a history of uncontrolled type 2 DM,  CAD (with stenting of OM1 11/2018),HIV,right BKA, CKD, chronic back pain, diabetic neuropathy, onychomycosis, hyperlipidemia, and hypertension, who presented to the hospital with multiple symptoms after running out of his insulin 1 week ago.Denied any chest pain but Ecg on admit showed evidence of acute inferolateral STEMI  Assessment & Plan    Inferior STEMI CAD - emergent heart cath with DES to OM1 - he tolerated the procedure well - continue ASA and brilinta - continue BB and statin - no chest pain   Acute on chronic kidney disease stage III - nephrology following, appreciate input - sCr 4.40 (3.87)   Uncontrolled DM - CBG on  admission 1027 - treated with insulin gtt - per medicine service, appreciate in put     Hyperlipidemia with LDL goal < 70 03/21/2020: Cholesterol 291; HDL 30; LDL Cholesterol 199; Triglycerides 310; VLDL 62 - was already on 80 mg lipitor - start vascepa at discharge - will likely need lipid clinic for PCSK9i - questionable compliance   Hx of HIV - continue biktarvy and dapsone   Hyponatremia - 131 from nadir of 119   Hypertension - pressure better controlled yesterday - now hypertensive in the 150s - if still elevated this morning, start 5 mg of home amlodipine    For questions or updates, please contact [place] Please consult [email or web address] for contact info under        Signed, Ledora Bottcher, PA  03/24/2020, 8:09 AM

## 2020-03-24 NOTE — Evaluation (Signed)
Physical Therapy Evaluation Patient Details Name: Keith [name] MRN: [id number] DOB: 09-20-1965 Today's Date: 03/24/2020   History of Present Illness  Pt adm with STEMI and underwent emergent heart cath with stent placement. PMH - DM, CAD, HIV, rt BKA, CKD, back pain, HTN, diabetic neuropathy.  Clinical Impression  Pt doing well with mobility and no further PT needed.  Ready for dc from PT standpoint.      Follow Up Recommendations No PT follow up    Equipment Recommendations  None recommended by PT    Recommendations for Other Services       Precautions / Restrictions Precautions Precautions: None Restrictions Weight Bearing Restrictions: No      Mobility  Bed Mobility Overal bed mobility: Independent                  Transfers Overall transfer level: Modified independent Equipment used: None             General transfer comment: Prosthetic donned EOB independently  Ambulation/Gait Ambulation/Gait assistance: Modified independent (Device/Increase time) Gait Distance (Feet): 450 Feet Assistive device: IV Pole Gait Pattern/deviations: Step-through pattern;Decreased stride length Gait velocity: decr Gait velocity interpretation: 1.31 - 2.62 ft/sec, indicative of limited community ambulator General Gait Details: Steady gait  Stairs            Wheelchair Mobility    Modified Rankin (Stroke Patients Only)       Balance Overall balance assessment: Mild deficits observed, not formally tested                                           Pertinent Vitals/Pain Pain Assessment: 0-10 Pain Location: rt groin Pain Descriptors / Indicators: Sore Pain Intervention(s): Premedicated before session;Limited activity within patient's tolerance    Home Living Family/patient expects to be discharged to:: Private residence Living Arrangements: Children Available Help at Discharge: Family Type of Home: Apartment Home Access: Stairs to  enter Entrance Stairs-Rails: Right;Left;Can reach both Technical brewer of Steps: 2 Home Layout: One level Home Equipment: Environmental consultant - 2 wheels      Prior Function Level of Independence: Independent with assistive device(s)         Comments: uses prosthetic     Hand Dominance        Extremity/Trunk Assessment   Upper Extremity Assessment Upper Extremity Assessment: Overall WFL for tasks assessed    Lower Extremity Assessment Lower Extremity Assessment: RLE deficits/detail RLE Deficits / Details: BKA       Communication   Communication: No difficulties  Cognition Arousal/Alertness: Awake/alert Behavior During Therapy: WFL for tasks assessed/performed                                          General Comments General comments (skin integrity, edema, etc.): RHR-73. HR after amb 85    Exercises     Assessment/Plan    PT Assessment Patent does not need any further PT services  PT Problem List         PT Treatment Interventions      PT Goals (Current goals can be found in the Care Plan section)  Acute Rehab PT Goals PT Goal Formulation: All assessment and education complete, DC therapy    Frequency     Barriers to discharge  Co-evaluation               AM-PAC PT "6 Clicks" Mobility  Outcome Measure Help needed turning from your back to your side while in a flat bed without using bedrails?: None Help needed moving from lying on your back to sitting on the side of a flat bed without using bedrails?: None Help needed moving to and from a bed to a chair (including a wheelchair)?: None Help needed standing up from a chair using your arms (e.g., wheelchair or bedside chair)?: None Help needed to walk in hospital room?: None Help needed climbing 3-5 steps with a railing? : None 6 Click Score: 24    End of Session   Activity Tolerance: Patient tolerated treatment well Patient left: in bed;with call bell/phone within  reach   PT Visit Diagnosis: Other abnormalities of gait and mobility (R26.89)    Time: 1059-1111 (plus 936-946) PT Time Calculation (min) (ACUTE ONLY): 12 min   Charges:   PT Evaluation $PT Eval Moderate Complexity: 1 Mod          [place] Pager [phone] Office [place] 03/24/2020, 11:21 AM

## 2020-03-24 NOTE — Progress Notes (Signed)
[id number] Pt walked with PT earlier and now stated he is resting. Pt seems to be in good spirits. Stressed again with pt the importance of brilinta with stent. Reviewed NTG use with CP. Encouraged pt to watch breads, rice, pasta and to choose more green vegetables. Encouraged not to forget meds or run out. Encouraged walking as tolerated. Has referral to CRP 2 , not interested. Stated will exercise on his own. Referral to [facility] to meet protocol. Per PT eval, pt can ambulate on his own. Graylon Good RN BSN 03/24/2020 2:00 PM

## 2020-03-24 NOTE — Progress Notes (Signed)
PROGRESS NOTE  Keith BIERNAT EXN:[id number] DOB: 1965/08/28 DOA: 03/21/2020 PCP: [name], FNP  Brief History   Keith [name] a 54 y.o.malewith medical history significant ofHIV on HAART, R BKA, CKD now progressed to stage 4 , DM2, HTN.  History of MI in 11/2018, got OM1 stent placement with DES at that time. Pt ran out of needles / insulin so hasnt been taking insulin for a 1 week prior to admission.  Presented to ED with c/o nausea, vomiting, blurry vision, abd pain, polyuria, and polydipsia. Symptoms are severe, not helped by drinking mountain dew .  He was noted to have an acute STEMI.  [facility] were consulted to admit the patient for further evaluation and care. He was admitted to a telemetry bed. Cardiology and nephrology were consulted.  The patient underwent LHC on 03/23/2020. He required DES to OM1. He will be continued on ASA and Brillinta for DAPT.   Consultants  . Cardiology . Nephrology  Procedures  . LHC with placement of DES in OM1  Antibiotics   Anti-infectives (From admission, onward)   Start     Dose/Rate Route Frequency Ordered Stop   03/22/20 1415  dapsone tablet 100 mg        100 mg Oral Daily 03/22/20 1321     03/22/20 1000  bictegravir-emtricitabine-tenofovir AF (BIKTARVY) 50-200-25 MG per tablet 1 tablet        1 tablet Oral Daily 03/22/20 0125      .   Subjective  The patient is resting comfortably. No new complaints.  Objective   Vitals:  Vitals:   03/24/20 0558 03/24/20 1511  BP: (!) 151/97 127/79  Pulse: 70 66  Resp: 18 20  Temp: 97.8 F (36.6 C) 98.5 F (36.9 C)  SpO2:  97%   Exam:  Constitutional:  . The patient is awake, alert, and oriented x 3. No acute distress. Respiratory:  . No increased work of breathing. . No wheezes, rales, or rhonchi . No tactile fremitus Cardiovascular:  . Regular rate and rhythm . No murmurs, ectopy, or gallups. . No lateral PMI. No thrills. Abdomen:  . Abdomen is soft,  non-tender, non-distended . No hernias, masses, or organomegaly . Normoactive bowel sounds.  Musculoskeletal:  . No cyanosis, clubbing, or edema Skin:  . No rashes, lesions, ulcers . palpation of skin: no induration or nodules Neurologic:  . CN 2-12 intact . Sensation all 4 extremities intact Psychiatric:  . Mental status o Mood, affect appropriate o Orientation to person, place, time  . judgment and insight appear intact  I have personally reviewed the following:   Today's Data  . Vitals, CBC, BMP  Micro Data  . MRSA by PCR  Cardiology Data  . EKG . Echocardiogram . LHC  Scheduled Meds: . aspirin  81 mg Oral Daily  . atorvastatin  80 mg Oral Daily  . bictegravir-emtricitabine-tenofovir AF  1 tablet Oral Daily  . carvedilol  25 mg Oral BID WC  . Chlorhexidine Gluconate Cloth  6 each Topical Q0600  . dapsone  100 mg Oral Daily  . heparin injection (subcutaneous)  5,000 Units Subcutaneous Q8H  . insulin aspart  0-15 Units Subcutaneous TID WC  . insulin aspart  0-5 Units Subcutaneous QHS  . [START ON 03/25/2020] insulin glargine  42 Units Subcutaneous Daily  . mupirocin ointment  1 application Nasal BID  . sodium chloride flush  3 mL Intravenous Q12H  . ticagrelor  90 mg Oral BID   Continuous  Infusions: . sodium chloride      Principal Problem:   Acute ST elevation myocardial infarction (STEMI) due to occlusion of circumflex coronary artery (HCC) Active Problems:   Essential hypertension   HIV disease (HCC)   STEMI (ST elevation myocardial infarction) (HCC)   CKD (chronic kidney disease) stage 4, GFR 15-29 ml/min (HCC)   Uncontrolled type 2 diabetes mellitus with hyperglycemia (HCC)   LOS: 2 days   A & P  Acute STEMI - S/p drug-eluting stent to OM1, just proximal to the 11/2018 stent he had put in [place].  Currently on dual antiplatelets.  Cardiology following.  Acute kidney injury on chronic kidney disease stage IV.  Likely secondary to contrast nephropathy.   Will continue with IV fluids.  Nephrology consult appreciated. Creatinine increasing.  DM2 with hyperglycemia      Initially required insulin drip but currently his blood glucose is improving.  Resumed preadmission Lantus and increased dose.  Patient reported that he ran out of Lantus prior to presenting to the ED hospital.  Continue with short-acting insulin coverage  HIV - Continue HAART Not clear if he is compliant with this at home.  Added to admission dapsone. CD4 count in May was only 89, and viral load was 7280  HTN - Continue with current medications.  Pressure injury: POA  Pressure Injury 11/10/18 Leg Right Stage II -  Partial thickness loss of dermis presenting as a shallow open ulcer with a red, pink wound bed without slough. (Active)  11/10/18 1857  Location: Leg  Location Orientation: Right  Staging: Stage II -  Partial thickness loss of dermis presenting as a shallow open ulcer with a red, pink wound bed without slough.  Wound Description (Comments):   Present on Admission: Yes   I have seen and examined this patient myself. I have spent 34 minutes in his evaluation and care.  DVT prophylaxis: heparin injection 5,000 Units Start: 03/23/20 1400 Code Status: Full code Family Communication: Discussed with patient Disposition: Status is: Inpatient  Remains inpatient appropriate because:Persistent severe electrolyte disturbances and worsening renal failure.  Dispo: The patient is from: Home  Anticipated d/c is to: Home  Anticipated d/c date is: 2 days  Patient currently is not medically stable to d/c.   Jia Dottavio, DO [facility] Direct contact: see [email or web address]  7PM-7AM contact night coverage as above 03/24/2020, 6:31 PM  LOS: 2 days

## 2020-03-24 NOTE — Progress Notes (Signed)
Patient requested potato chips for a snack. This RN counselled patient on his high blood sugar and how the snacks may make his blood sugar go even higher, and to refrain from carb-heavy snacking if possible. Sugar-free jello and pudding offered as a replacement.

## 2020-03-24 NOTE — Progress Notes (Addendum)
[name], male   DOB: May 17, 1965, 54 y.o.   MRN: [id number] [facility] Progress Note   Assessment/ Plan:   1.  Acute kidney injury on chronic kidney disease stage III: This appears to be consistent with contrast-induced nephropathy in what clinically may have been a volume contracted patient secondary to osmotic diuresis with significant hyperglycemia (poorly controlled underlying diabetes mellitus)-this is corroborated from urine electrolytes.  Nonoliguric overnight with rising creatinine but without any acute electrolyte abnormalities or significant volume excess/uremic symptoms prompt need for dialysis.  We will continue close surveillance of renal function and limit volume loading at this time to LR 50 cc an hour for the next 10 hours and discontinue thereafter. 2.  ST elevation MI: Clinically stable status post emergent coronary angiography and drug-eluting stent placement to OM1.    Appears to be doing better from cardiology standpoint-no chest pain and with minimal groin pain. 3.  Insulin-dependent diabetes mellitus: Poorly controlled with significantly elevated hemoglobin A1c.  Management per hospitalist service. 4.  Hypertension: Blood pressures currently elevated, restarted back on amlodipine and carvedilol today by cardiology. 5.  HIV infection: Continue current antiretroviral therapy, adherence questionable with low CD4 count/viral load.  He has significant proteinuria raising concern for HIV-associated nephropathy however, may also be from his diabetic kidney disease.  Subjective:   Reports to be feeling fair; denies any chest pain or shortness of breath.   Objective:   BP (!) 151/97 (BP Location: Right Arm)   Pulse 70   Temp 97.8 F (36.6 C) (Oral)   Resp 18   Ht 5\' 9"  (1.753 m)   Wt 122.5 kg   SpO2 99%   BMI 39.88 kg/m   Intake/Output Summary (Last 24 hours) at 03/24/2020 0944 Last data filed at 03/24/2020 0914 Gross per 24 hour  Intake  1788.14 ml  Output 800 ml  Net 988.14 ml   Weight change: 2.75 kg  Physical Exam: Gen: Comfortably sitting up in his bed, eating breakfast/watching TV CVS: Pulse regular rhythm, normal rate, S1 and S2 normal Resp: Clear to auscultation bilaterally, no distinct rales or rhonchi Abd: Soft, obese, nontender, bowel sounds normal Ext: Status post right BKA.  No left lower extremity edema  Imaging: US RENAL  Result Date: 03/23/2020 CLINICAL DATA:  Acute kidney injury EXAM: RENAL / URINARY TRACT ULTRASOUND COMPLETE COMPARISON:  Ultrasound 10/21/2018 FINDINGS: Right Kidney: Renal measurements: 12.8 x 6 x 5.2 cm = volume: 207.3 mL. Cortex is slightly echogenic. No mass or hydronephrosis. Left Kidney: Renal measurements: 12.2 x 5.8 x 5.2 cm = volume: 192.1 mL. Cortex appears slightly echogenic. No mass or hydronephrosis. Bladder: Appears normal for degree of bladder distention. Other: None. IMPRESSION: 1. Slightly echogenic kidneys suggestive of medical renal disease. 2. No hydronephrosis Electronically Signed   By: Donavan Foil M.D.   On: 03/23/2020 18:03    Labs: BMET Recent Labs  Lab 03/21/20 1411 03/21/20 2109 03/21/20 2232 03/22/20 0312 03/23/20 0213 03/24/20 0211  NA 119* 122* 120* 127* 131* 131*  K 5.5* 5.5* 4.8 3.8 3.8 3.9  CL 83* 88* 87* 92* 96* 99  CO2 22  --  22 21* 23 20*  GLUCOSE 1,027* >700* 761* 387* 241* 313*  BUN 29* 36* 30* 29* 38* 44*  CREATININE 2.29* 2.10* 2.33* 2.29* 3.87* 4.40*  CALCIUM 9.4  --  9.5 9.0 9.2 8.6*  PHOS  --   --   --   --   --  4.6   CBC  Recent Labs  Lab 03/21/20 1411 03/21/20 2109 03/22/20 0312 03/24/20 0211  WBC 6.1  --  6.4 6.9  HGB 12.5* 15.0 11.0* 10.6*  HCT 38.2* 44.0 31.7* 32.3*  MCV 83.4  --  77.9* 80.5  PLT 244  --  251 239   Medications:    . aspirin  81 mg Oral Daily  . atorvastatin  80 mg Oral Daily  . bictegravir-emtricitabine-tenofovir AF  1 tablet Oral Daily  . carvedilol  25 mg Oral BID WC  . Chlorhexidine  Gluconate Cloth  6 each Topical Q0600  . dapsone  100 mg Oral Daily  . heparin injection (subcutaneous)  5,000 Units Subcutaneous Q8H  . insulin aspart  0-15 Units Subcutaneous TID WC  . insulin aspart  0-5 Units Subcutaneous QHS  . insulin glargine  38 Units Subcutaneous Daily  . mupirocin ointment  1 application Nasal BID  . sodium chloride flush  3 mL Intravenous Q12H  . ticagrelor  90 mg Oral BID   Elmarie Shiley, MD 03/24/2020, 9:44 AM

## 2020-03-25 LAB — GLUCOSE, CAPILLARY
Glucose-Capillary: 295 mg/dL — ABNORMAL HIGH (ref 70–99)
Glucose-Capillary: 234 mg/dL — ABNORMAL HIGH (ref 70–99)
Glucose-Capillary: 327 mg/dL — ABNORMAL HIGH (ref 70–99)
Glucose-Capillary: 408 mg/dL — ABNORMAL HIGH (ref 70–99)

## 2020-03-25 LAB — RENAL FUNCTION PANEL
Albumin: 2.3 g/dL — ABNORMAL LOW (ref 3.5–5.0)
Anion gap: 11 (ref 5–15)
BUN: 41 mg/dL — ABNORMAL HIGH (ref 6–20)
CO2: 21 mmol/L — ABNORMAL LOW (ref 22–32)
Calcium: 8.7 mg/dL — ABNORMAL LOW (ref 8.9–10.3)
Chloride: 102 mmol/L (ref 98–111)
Creatinine, Ser: 4.29 mg/dL — ABNORMAL HIGH (ref 0.61–1.24)
GFR, Estimated: 15 mL/min — ABNORMAL LOW (ref >60)
Glucose, Bld: 325 mg/dL — ABNORMAL HIGH (ref 70–99)
Phosphorus: 4.2 mg/dL (ref 2.5–4.6)
Potassium: 4 mmol/L (ref 3.5–5.1)
Sodium: 134 mmol/L — ABNORMAL LOW (ref 135–145)

## 2020-03-25 LAB — GLUCOSE, RANDOM: Glucose, Bld: 400 mg/dL — ABNORMAL HIGH (ref 70–99)

## 2020-03-25 MED ORDER — INSULIN ASPART 100 UNIT/ML ~~LOC~~ SOLN
10.0000 [IU] | Freq: Once | SUBCUTANEOUS | Status: AC
  Administered 2020-03-25: 10 [IU] via SUBCUTANEOUS

## 2020-03-25 MED ORDER — INSULIN ASPART 100 UNIT/ML ~~LOC~~ SOLN
3.0000 [IU] | Freq: Three times a day (TID) | SUBCUTANEOUS | Status: DC
  Administered 2020-03-25 – 2020-03-28 (×9): 3 [IU] via SUBCUTANEOUS

## 2020-03-25 MED ORDER — INSULIN ASPART 100 UNIT/ML ~~LOC~~ SOLN: 0.0000 [IU] | Freq: Every day | SUBCUTANEOUS | Status: DC

## 2020-03-25 MED ORDER — INSULIN ASPART 100 UNIT/ML ~~LOC~~ SOLN
0.0000 [IU] | Freq: Three times a day (TID) | SUBCUTANEOUS | Status: DC
  Administered 2020-03-25: 15 [IU] via SUBCUTANEOUS
  Administered 2020-03-25 – 2020-03-26 (×2): 7 [IU] via SUBCUTANEOUS
  Administered 2020-03-26: 4 [IU] via SUBCUTANEOUS
  Administered 2020-03-26: 20 [IU] via SUBCUTANEOUS
  Administered 2020-03-27: 4 [IU] via SUBCUTANEOUS
  Administered 2020-03-27: 3 [IU] via SUBCUTANEOUS
  Administered 2020-03-27: 10 [IU] via SUBCUTANEOUS
  Administered 2020-03-28 (×2): 11 [IU] via SUBCUTANEOUS

## 2020-03-25 NOTE — Progress Notes (Signed)
[name], male   DOB: 1965-07-18, 54 y.o.   MRN: [id number] [facility] Progress Note   Assessment/ Plan:   1.  Acute kidney injury on chronic kidney disease stage III: This appears to be consistent with contrast-induced nephropathy in what clinically may have been a volume contracted patient secondary to osmotic diuresis with significant hyperglycemia (poorly controlled underlying diabetes mellitus)-this is corroborated from urine electrolytes.  Urine output improving overnight with corresponding slight improvement of creatinine-anticipate that this is indicative of ensuing renal recovery and will continue to follow labs.  He will need regular follow-up as an outpatient given multiple renal risk factors and underlying chronic kidney disease. 2.  ST elevation MI: Clinically stable status post emergent coronary angiography and drug-eluting stent placement to OM1.  Denies any chest pain or symptoms attributable to volume overload. 3.  Insulin-dependent diabetes mellitus: Poorly controlled with significantly elevated hemoglobin A1c.  Management per hospitalist service. 4.  Hypertension: Blood pressures currently elevated, restarted back on amlodipine and carvedilol today by cardiology. 5.  HIV infection: Continue current antiretroviral therapy, adherence questionable with low CD4 count/viral load.  Discussed the importance of adherence.  Subjective:   Denies any acute events overnight-denies any chest pain or shortness of breath   Objective:   BP (!) 133/91   Pulse 71   Temp 98.5 F (36.9 C) (Oral)   Resp 20   Ht 5\' 9"  (1.753 m)   Wt 121.7 kg   SpO2 98%   BMI 39.61 kg/m   Intake/Output Summary (Last 24 hours) at 03/25/2020 1017 Last data filed at 03/25/2020 0846 Gross per 24 hour  Intake 3 ml  Output 1710 ml  Net -1707 ml   Weight change: -0.845 kg  Physical Exam: Gen: Comfortably sitting up by the edge of his bed putting on sleeve for prosthetic right  leg  CVS: Pulse regular rhythm, normal rate, S1 and S2 normal Resp: Clear to auscultation bilaterally, no distinct rales or rhonchi Abd: Soft, obese, nontender, bowel sounds normal Ext: Status post right BKA.  No left lower extremity edema  Imaging: US RENAL  Result Date: 03/23/2020 CLINICAL DATA:  Acute kidney injury EXAM: RENAL / URINARY TRACT ULTRASOUND COMPLETE COMPARISON:  Ultrasound 10/21/2018 FINDINGS: Right Kidney: Renal measurements: 12.8 x 6 x 5.2 cm = volume: 207.3 mL. Cortex is slightly echogenic. No mass or hydronephrosis. Left Kidney: Renal measurements: 12.2 x 5.8 x 5.2 cm = volume: 192.1 mL. Cortex appears slightly echogenic. No mass or hydronephrosis. Bladder: Appears normal for degree of bladder distention. Other: None. IMPRESSION: 1. Slightly echogenic kidneys suggestive of medical renal disease. 2. No hydronephrosis Electronically Signed   By: Donavan Foil M.D.   On: 03/23/2020 18:03    Labs: BMET Recent Labs  Lab 03/21/20 1411 03/21/20 2109 03/21/20 2232 03/22/20 0312 03/23/20 0213 03/24/20 0211 03/25/20 0152  NA 119* 122* 120* 127* 131* 131* 134*  K 5.5* 5.5* 4.8 3.8 3.8 3.9 4.0  CL 83* 88* 87* 92* 96* 99 102  CO2 22  --  22 21* 23 20* 21*  GLUCOSE 1,027* >700* 761* 387* 241* 313* 325*  BUN 29* 36* 30* 29* 38* 44* 41*  CREATININE 2.29* 2.10* 2.33* 2.29* 3.87* 4.40* 4.29*  CALCIUM 9.4  --  9.5 9.0 9.2 8.6* 8.7*  PHOS  --   --   --   --   --  4.6 4.2   CBC Recent Labs  Lab 03/21/20 1411 03/21/20 2109 03/22/20 0312 03/24/20 0211  WBC 6.1  --  6.4 6.9  HGB 12.5* 15.0 11.0* 10.6*  HCT 38.2* 44.0 31.7* 32.3*  MCV 83.4  --  77.9* 80.5  PLT 244  --  251 239   Medications:    . amLODipine  5 mg Oral Daily  . aspirin  81 mg Oral Daily  . atorvastatin  80 mg Oral Daily  . bictegravir-emtricitabine-tenofovir AF  1 tablet Oral Daily  . carvedilol  25 mg Oral BID WC  . Chlorhexidine Gluconate Cloth  6 each Topical Q0600  . dapsone  100 mg Oral Daily  .  heparin injection (subcutaneous)  5,000 Units Subcutaneous Q8H  . insulin aspart  0-20 Units Subcutaneous TID WC  . insulin aspart  0-5 Units Subcutaneous QHS  . insulin aspart  0-5 Units Subcutaneous QHS  . insulin aspart  3 Units Subcutaneous TID WC  . insulin glargine  42 Units Subcutaneous Daily  . mupirocin ointment  1 application Nasal BID  . sodium chloride flush  3 mL Intravenous Q12H  . ticagrelor  90 mg Oral BID   Elmarie Shiley, MD 03/25/2020, 10:17 AM

## 2020-03-25 NOTE — Progress Notes (Signed)
PROGRESS NOTE  Keith [name] QPY:[id number] DOB: 01/15/1966 DOA: 03/21/2020 PCP: Keith Glatter, FNP  Brief History   Keith [name] a 54 y.o.malewith medical history significant ofHIV on HAART, R BKA, CKD now progressed to stage 4 , DM2, HTN.  History of MI in 11/2018, got OM1 stent placement with DES at that time. Pt ran out of needles / insulin so hasnt been taking insulin for a 1 week prior to admission.  Presented to ED with c/o nausea, vomiting, blurry vision, abd pain, polyuria, and polydipsia. Symptoms are severe, not helped by drinking mountain dew .  He was noted to have an acute STEMI.  [facility] were consulted to admit the patient for further evaluation and care. He was admitted to a telemetry bed. Cardiology and nephrology were consulted.  The patient underwent LHC on 03/23/2020. He required DES to OM1. He will be continued on ASA and Brillinta for DAPT.   Consultants  . Cardiology . Nephrology  Procedures  . LHC with placement of DES in OM1  Antibiotics   Anti-infectives (From admission, onward)   Start     Dose/Rate Route Frequency Ordered Stop   03/22/20 1415  dapsone tablet 100 mg        100 mg Oral Daily 03/22/20 1321     03/22/20 1000  bictegravir-emtricitabine-tenofovir AF (BIKTARVY) 50-200-25 MG per tablet 1 tablet        1 tablet Oral Daily 03/22/20 0125        Subjective  The patient is resting comfortably. No new complaints.  Objective   Vitals:  Vitals:   03/25/20 0842 03/25/20 1600  BP: (!) 133/91 (!) 144/76  Pulse:  71  Resp:  18  Temp:  98.8 F (37.1 C)  SpO2:  98%   Exam:  Constitutional:  . The patient is awake, alert, and oriented x 3. No acute distress. Respiratory:  . No increased work of breathing. . No wheezes, rales, or rhonchi . No tactile fremitus Cardiovascular:  . Regular rate and rhythm . No murmurs, ectopy, or gallups. . No lateral PMI. No thrills. Abdomen:  . Abdomen is soft, non-tender,  non-distended . No hernias, masses, or organomegaly . Normoactive bowel sounds.  Musculoskeletal:  . No cyanosis, clubbing, or edema Skin:  . No rashes, lesions, ulcers . palpation of skin: no induration or nodules Neurologic:  . CN 2-12 intact . Sensation all 4 extremities intact Psychiatric:  . Mental status o Mood, affect appropriate o Orientation to person, place, time  . judgment and insight appear intact  I have personally reviewed the following:   Today's Data  . Vitals, BMP, Glucoses  Micro Data  . MRSA by PCR  Cardiology Data  . EKG . Echocardiogram . LHC  Scheduled Meds: . amLODipine  5 mg Oral Daily  . aspirin  81 mg Oral Daily  . atorvastatin  80 mg Oral Daily  . bictegravir-emtricitabine-tenofovir AF  1 tablet Oral Daily  . carvedilol  25 mg Oral BID WC  . Chlorhexidine Gluconate Cloth  6 each Topical Q0600  . dapsone  100 mg Oral Daily  . heparin injection (subcutaneous)  5,000 Units Subcutaneous Q8H  . insulin aspart  0-20 Units Subcutaneous TID WC  . insulin aspart  0-5 Units Subcutaneous QHS  . insulin aspart  3 Units Subcutaneous TID WC  . insulin glargine  42 Units Subcutaneous Daily  . mupirocin ointment  1 application Nasal BID  . sodium chloride flush  3 mL Intravenous  Q12H  . ticagrelor  90 mg Oral BID   Continuous Infusions: . sodium chloride      Principal Problem:   Acute ST elevation myocardial infarction (STEMI) due to occlusion of circumflex coronary artery (HCC) Active Problems:   Essential hypertension   HIV disease (HCC)   STEMI (ST elevation myocardial infarction) (HCC)   CKD (chronic kidney disease) stage 4, GFR 15-29 ml/min (HCC)   Uncontrolled type 2 diabetes mellitus with hyperglycemia (HCC)   LOS: 3 days   A & P  Acute STEMI - S/p drug-eluting stent to OM1, just proximal to the 11/2018 stent he had put in [place].  Currently on dual antiplatelets.  Cardiology following.  Acute kidney injury on chronic kidney disease  stage IV.  Likely secondary to contrast nephropathy.  Will continue with IV fluids.  Nephrology consult appreciated. Creatinine increasing.  DM2 with hyperglycemia: Glucoses remain elevated Lantus increased. SSI increased to resistant. Prandial insulin added. Continue to monitor.  HIV - Continue HAART Not clear if he is compliant with this at home.  Added to admission dapsone. CD4 count in May was only 89, and viral load was 7280  HTN - Continue with current medications.  Pressure injury: POA  Pressure Injury 11/10/18 Leg Right Stage II -  Partial thickness loss of dermis presenting as a shallow open ulcer with a red, pink wound bed without slough. (Active)  11/10/18 1857  Location: Leg  Location Orientation: Right  Staging: Stage II -  Partial thickness loss of dermis presenting as a shallow open ulcer with a red, pink wound bed without slough.  Wound Description (Comments):   Present on Admission: Yes   I have seen and examined this patient myself. I have spent 35 minutes in his evaluation and care.  DVT prophylaxis: heparin injection 5,000 Units Start: 03/23/20 1400 Code Status: Full code Family Communication: Discussed with patient Disposition: Status is: Inpatient  Remains inpatient appropriate because:Persistent severe electrolyte disturbances and worsening renal failure.  Dispo: The patient is from: Home  Anticipated d/c is to: Home  Anticipated d/c date is: 2 days  Patient currently is not medically stable to d/c.  Keith Desaulniers, DO [facility] Direct contact: see [email or web address]  7PM-7AM contact night coverage as above 03/25/2020, 5:00 PM  LOS: 2 days

## 2020-03-25 NOTE — Progress Notes (Signed)
Progress Note  Patient Name: Keith [name] Date of Encounter: 03/25/2020  [facility] Cardiologist: Larae Grooms, MD   Subjective   No complaints  Inpatient Medications    Scheduled Meds: . amLODipine  5 mg Oral Daily  . aspirin  81 mg Oral Daily  . atorvastatin  80 mg Oral Daily  . bictegravir-emtricitabine-tenofovir AF  1 tablet Oral Daily  . carvedilol  25 mg Oral BID WC  . Chlorhexidine Gluconate Cloth  6 each Topical Q0600  . dapsone  100 mg Oral Daily  . heparin injection (subcutaneous)  5,000 Units Subcutaneous Q8H  . insulin aspart  0-15 Units Subcutaneous TID WC  . insulin aspart  0-5 Units Subcutaneous QHS  . insulin glargine  42 Units Subcutaneous Daily  . mupirocin ointment  1 application Nasal BID  . sodium chloride flush  3 mL Intravenous Q12H  . ticagrelor  90 mg Oral BID   Continuous Infusions: . sodium chloride     PRN Meds: sodium chloride, acetaminophen, dextrose, nitroGLYCERIN, ondansetron (ZOFRAN) IV, oxyCODONE, sodium chloride flush   Vital Signs    Vitals:   03/23/20 2114 03/24/20 0558 03/24/20 1511 03/25/20 0559  BP: 130/80 (!) 151/97 127/79 (!) 154/91  Pulse: 74 70 66 71  Resp: 19 18 20 19   Temp: 98.4 F (36.9 C) 97.8 F (36.6 C) 98.5 F (36.9 C) 98.5 F (36.9 C)  TempSrc: Oral Oral Oral Oral  SpO2:   97% 98%  Weight:  122.5 kg  121.7 kg  Height:        Intake/Output Summary (Last 24 hours) at 03/25/2020 0744 Last data filed at 03/25/2020 0601 Gross per 24 hour  Intake 3 ml  Output 1800 ml  Net -1797 ml   Last 3 Weights 03/25/2020 03/24/2020 03/22/2020  Weight (lbs) 268 lb 3.2 oz 270 lb 1 oz 264 lb  Weight (kg) 121.655 kg 122.5 kg 119.75 kg  Some encounter information is confidential and restricted. Go to Review Flowsheets activity to see all data.      Telemetry    SR - Personally Reviewed  ECG    n/a - Personally Reviewed  Physical Exam   GEN: No acute distress.   Neck: No JVD Cardiac: RRR, no  murmurs, rubs, or gallops.  Respiratory: Clear to auscultation bilaterally. GI: Soft, nontender, non-distended  MS: No edema; No deformity. Neuro:  Nonfocal  Psych: Normal affect   Labs    High Sensitivity Troponin:   Recent Labs  Lab 03/21/20 2232 03/22/20 0312  TROPONINIHS 1,065* 1,427*      Chemistry Recent Labs  Lab 03/21/20 2232 03/22/20 0312 03/23/20 0213 03/24/20 0211 03/25/20 0152  NA 120*   < > 131* 131* 134*  K 4.8   < > 3.8 3.9 4.0  CL 87*   < > 96* 99 102  CO2 22   < > 23 20* 21*  GLUCOSE 761*   < > 241* 313* 325*  BUN 30*   < > 38* 44* 41*  CREATININE 2.33*   < > 3.87* 4.40* 4.29*  CALCIUM 9.5   < > 9.2 8.6* 8.7*  PROT 8.9*  --   --   --   --   ALBUMIN 3.1*  --   --  2.4* 2.3*  AST 25  --   --   --   --   ALT 17  --   --   --   --   ALKPHOS 97  --   --   --   --  BILITOT 0.6  --   --   --   --   GFRNONAA 32*   < > 18* 15* 15*  ANIONGAP 11   < > 12 12 11    < > = values in this interval not displayed.     Hematology Recent Labs  Lab 03/21/20 1411 03/21/20 2109 03/22/20 0312 03/24/20 0211  WBC 6.1  --  6.4 6.9  RBC 4.58  --  4.07* 4.01*  HGB 12.5* 15.0 11.0* 10.6*  HCT 38.2* 44.0 31.7* 32.3*  MCV 83.4  --  77.9* 80.5  MCH 27.3  --  27.0 26.4  MCHC 32.7  --  34.7 32.8  RDW 12.6  --  11.9 12.1  PLT 244  --  251 239    BNPNo results for input(s): BNP, PROBNP in the last 168 hours.   DDimer No results for input(s): DDIMER in the last 168 hours.   Radiology    US RENAL  Result Date: 03/23/2020 CLINICAL DATA:  Acute kidney injury EXAM: RENAL / URINARY TRACT ULTRASOUND COMPLETE COMPARISON:  Ultrasound 10/21/2018 FINDINGS: Right Kidney: Renal measurements: 12.8 x 6 x 5.2 cm = volume: 207.3 mL. Cortex is slightly echogenic. No mass or hydronephrosis. Left Kidney: Renal measurements: 12.2 x 5.8 x 5.2 cm = volume: 192.1 mL. Cortex appears slightly echogenic. No mass or hydronephrosis. Bladder: Appears normal for degree of bladder distention.  Other: None. IMPRESSION: 1. Slightly echogenic kidneys suggestive of medical renal disease. 2. No hydronephrosis Electronically Signed   By: Donavan Foil M.D.   On: 03/23/2020 18:03    Cardiac Studies     Patient Profile     54 y.o. male with a history of uncontrolled type 2 DM, CAD (with stenting of OM1 11/2018),HIV,right BKA, CKD, chronic back pain, diabetic neuropathy, onychomycosis, hyperlipidemia, and hypertension, who presented to the hospital with multiple symptoms after running out of his insulin 1 week ago.Denied any chest pain but Ecg on admit showed evidence of acute inferolateral STEMI  Assessment & Plan    1. STEMI/CAD - received DES to OM1 - echo LVEF 55-60%, cannot eval WMAs, grade I DDx - medical therapy with ASA, brillinta, atorva 80, coreg 25mg  bid, no ACE/ARB due to renal function  2. AKI on CKD III - per nephrology, Cr downtrend to day to 4.3 - thought to be contrast nephropathy.  3. Uncontrolled DM2 - CBG on admission 1027 - treated with insulin gtt - per medicine service  4. Hyperlipidemia - 03/21/2020: Cholesterol 291; HDL 30; LDL Cholesterol 199; Triglycerides 310; VLDL 62 - was already on 80 mg lipitor - start vascepa at discharge - will likely need lipid clinic for PCSK9i - questionable compliance  5. HTN - started on norvasc yesterday though first dose is this AM Follow bp's today   For questions or updates, please contact [facility] Please consult [email or web address] for contact info under        Signed, Carlyle Dolly, MD  03/25/2020, 7:44 AM

## 2020-03-26 LAB — RENAL FUNCTION PANEL
Albumin: 2.5 g/dL — ABNORMAL LOW (ref 3.5–5.0)
Anion gap: 11 (ref 5–15)
BUN: 38 mg/dL — ABNORMAL HIGH (ref 6–20)
CO2: 20 mmol/L — ABNORMAL LOW (ref 22–32)
Calcium: 8.8 mg/dL — ABNORMAL LOW (ref 8.9–10.3)
Chloride: 101 mmol/L (ref 98–111)
Creatinine, Ser: 3.31 mg/dL — ABNORMAL HIGH (ref 0.61–1.24)
GFR, Estimated: 21 mL/min — ABNORMAL LOW (ref >60)
Glucose, Bld: 349 mg/dL — ABNORMAL HIGH (ref 70–99)
Phosphorus: 4 mg/dL (ref 2.5–4.6)
Potassium: 4.2 mmol/L (ref 3.5–5.1)
Sodium: 132 mmol/L — ABNORMAL LOW (ref 135–145)

## 2020-03-26 LAB — GLUCOSE, CAPILLARY
Glucose-Capillary: 362 mg/dL — ABNORMAL HIGH (ref 70–99)
Glucose-Capillary: 219 mg/dL — ABNORMAL HIGH (ref 70–99)
Glucose-Capillary: 192 mg/dL — ABNORMAL HIGH (ref 70–99)
Glucose-Capillary: 193 mg/dL — ABNORMAL HIGH (ref 70–99)

## 2020-03-26 MED ORDER — AMLODIPINE BESYLATE 5 MG PO TABS
5.0000 mg | ORAL_TABLET | Freq: Once | ORAL | Status: AC
  Administered 2020-03-26: 5 mg via ORAL
  Filled 2020-03-26: qty 1

## 2020-03-26 MED ORDER — INSULIN GLARGINE 100 UNIT/ML ~~LOC~~ SOLN
50.0000 [IU] | Freq: Every day | SUBCUTANEOUS | Status: DC
  Administered 2020-03-27: 50 [IU] via SUBCUTANEOUS
  Filled 2020-03-26 (×3): qty 0.5

## 2020-03-26 MED ORDER — AMLODIPINE BESYLATE 10 MG PO TABS
10.0000 mg | ORAL_TABLET | Freq: Every day | ORAL | Status: DC
  Administered 2020-03-27 – 2020-03-28 (×2): 10 mg via ORAL
  Filled 2020-03-26 (×2): qty 1

## 2020-03-26 MED ORDER — OXYCODONE HCL 5 MG PO TABS
10.0000 mg | ORAL_TABLET | Freq: Four times a day (QID) | ORAL | Status: DC | PRN
  Administered 2020-03-26: 15 mg via ORAL
  Filled 2020-03-26 (×2): qty 3

## 2020-03-26 NOTE — Progress Notes (Addendum)
PROGRESS NOTE  Keith [name]:[id number] DOB: 1965-05-10 DOA: 03/21/2020 PCP: [name], FNP  Brief History   Keith [name] is a 54 y.o. male with medical history significant of HIV on HAART, R BKA, CKD now progressed to stage 4 , DM2, HTN.  History of MI in 11/2018, got OM1 stent placement with DES at that time.  Pt ran out of needles / insulin so hasnt been taking insulin for a 1 week prior to admission.   Presented to ED with c/o nausea, vomiting, blurry vision, abd pain, polyuria, and polydipsia.  Symptoms are severe, not helped by drinking mountain dew .  He was noted to have an acute STEMI.  [facility] were consulted to admit the patient for further evaluation and care. He was admitted to a telemetry bed. Cardiology and nephrology were consulted.  The patient underwent LHC on 03/23/2020. He required DES to OM1. He will be continued on ASA and Brillinta for DAPT.   Consultants  . Cardiology . Nephrology  Procedures  . LHC with placement of DES in OM1  Antibiotics   . Anti-infectives (From admission, onward)   .  Marland Kitchen Start .   .   Zada Girt . Route . Frequency . Ordered . Stop  .  Marland Kitchen 03/22/20 1415 .  . dapsone tablet 100 mg      .   Marland Kitchen 100 mg . Oral . Daily . 03/22/20 1321 .    .  . 03/22/20 1000 .  . bictegravir-emtricitabine-tenofovir AF (BIKTARVY) 50-200-25 MG per tablet 1 tablet      .   . 1 tablet . Oral . Daily . 03/22/20 0125 .     .   .   Subjective  The patient is resting comfortably. He is complaining of pain in the right leg that is not managed by current pain regimen.  Objective   Vitals:  Vitals:   03/26/20 0553 03/26/20 0800  BP: (!) 163/96 115/76  Pulse:    Resp:  16  Temp: 98.1 F (36.7 C) 98.4 F (36.9 C)  SpO2: 98%    Exam:  Constitutional:  . The patient is awake, alert, and oriented x 3. No acute distress. Respiratory:  . No increased work of breathing. . No wheezes, rales, or rhonchi . No tactile  fremitus Cardiovascular:  . Regular rate and rhythm . No murmurs, ectopy, or gallups. . No lateral PMI. No thrills. Abdomen:  . Abdomen is soft, non-tender, non-distended . No hernias, masses, or organomegaly . Normoactive bowel sounds.  Musculoskeletal:  . No cyanosis, clubbing, or edema Skin:  . No rashes, lesions, ulcers . palpation of skin: no induration or nodules Neurologic:  . CN 2-12 intact . Sensation all 4 extremities intact Psychiatric:  . Mental status o Mood, affect appropriate o Orientation to person, place, time  . judgment and insight appear intact  I have personally reviewed the following:   Today's Data  . Vitals, BMP, Glucoses  Micro Data  . MRSA by PCR  Cardiology Data  . EKG . Echocardiogram . LHC  Scheduled Meds: . [START ON 03/27/2020] amLODipine  10 mg Oral Daily  . aspirin  81 mg Oral Daily  . atorvastatin  80 mg Oral Daily  . bictegravir-emtricitabine-tenofovir AF  1 tablet Oral Daily  . carvedilol  25 mg Oral BID WC  . Chlorhexidine Gluconate Cloth  6 each Topical Q0600  . dapsone  100 mg Oral Daily  . heparin injection (subcutaneous)  5,000 Units Subcutaneous Q8H  . insulin aspart  0-20 Units Subcutaneous TID WC  . insulin aspart  0-5 Units Subcutaneous QHS  . insulin aspart  3 Units Subcutaneous TID WC  . [START ON 03/27/2020] insulin glargine  50 Units Subcutaneous Daily  . mupirocin ointment  1 application Nasal BID  . sodium chloride flush  3 mL Intravenous Q12H  . ticagrelor  90 mg Oral BID   Continuous Infusions: . sodium chloride      Principal Problem:   Acute ST elevation myocardial infarction (STEMI) due to occlusion of circumflex coronary artery (HCC) Active Problems:   Essential hypertension   HIV disease (HCC)   STEMI (ST elevation myocardial infarction) (HCC)   CKD (chronic kidney disease) stage 4, GFR 15-29 ml/min (HCC)   Uncontrolled type 2 diabetes mellitus with hyperglycemia (HCC)   LOS: 4 days   A & P   Acute STEMI - S/p drug-eluting stent to OM1, just proximal to the 11/2018 stent he had put in [place].  Currently on dual antiplatelets.  Cardiology following.  Leg pain: The patient is complaining of pain in the right lower extremity. Oxycodone increased.    Acute kidney injury on chronic kidney disease stage IV.  Likely secondary to contrast nephropathy.  Will continue with IV fluids.  Nephrology consult appreciated. Creatinine decreased to 3.31 from 4.29 yesterday.   DM2 with hyperglycemia: Glucoses remain elevated Lantus increased. SSI increased to resistant. Prandial insulin added. Continue to monitor.  HIV - Continue HAART Not clear if he is compliant with this at home.  Added to admission dapsone. CD4 count in May was only 89, and viral load was 7280   HTN - Continue with current medications.   Pressure injury: POA   Pressure Injury 11/10/18 Leg Right Stage II -  Partial thickness loss of dermis presenting as a shallow open ulcer with a red, pink wound bed without slough. (Active)  11/10/18 1857  Location: Leg  Location Orientation: Right  Staging: Stage II -  Partial thickness loss of dermis presenting as a shallow open ulcer with a red, pink wound bed without slough.  Wound Description (Comments):   Present on Admission: Yes    I have seen and examined this patient myself. I have spent 32 minutes in his evaluation and care.  DVT prophylaxis: heparin injection 5,000 Units Start: 03/23/20 1400 Code Status: Full code Family Communication: Discussed with patient   Disposition: Status is: Inpatient   Remains inpatient appropriate because:Persistent severe electrolyte disturbances and worsening renal failure.   Dispo: The patient is from: Home              Anticipated d/c is to: Home              Anticipated d/c date is: 2 days              Patient currently is not medically stable to d/c.  Tatiyana Foucher, DO [facility] Direct contact: see [email or web address]  7PM-7AM  contact night coverage as above 03/26/2020, 5:53 PM  LOS: 2 days

## 2020-03-26 NOTE — Progress Notes (Signed)
[name], male   DOB: 1965/05/01, 54 y.o.   MRN: [id number] [facility] Progress Note   Assessment/ Plan:   1.  Acute kidney injury on chronic kidney disease stage III: From contrast-induced nephropathy in a volume contracted patient secondary to osmotic diuresis with significant hyperglycemia (poorly controlled underlying diabetes mellitus).  He has excellent urine output with continued downtrending creatinine indicated of renal recovery.  Recommend monitoring labs/trajectory of renal function for the next 24 hours and discharge if continues to show improvement.  I will set him up for outpatient renal follow-up in 3 to 4 weeks (the office will contact him with details). 2.  ST elevation MI: Clinically stable status post emergent coronary angiography and drug-eluting stent placement to OM1.  Some tenderness without swelling in the right thigh noted-likely not related to femoral artery cannulation site. 3.  Insulin-dependent diabetes mellitus: Poorly controlled with significantly elevated hemoglobin A1c.  Management per hospitalist service. 4.  Hypertension: Blood pressures improving and with acceptable control on amlodipine and carvedilol. 5.  HIV infection: Continue current antiretroviral therapy, adherence questionable with low CD4 count/viral load.  Discussed the importance of adherence.  Subjective:   Complains of some intermittent right leg/thigh pain below femoral cannulation site for coronary angiogram.  Plans noted per cardiology for possible ultrasound if progresses.  He reports to be feeling otherwise well without any chest pain or shortness of breath.   Objective:   BP 115/76 (BP Location: Left Arm)   Pulse 69   Temp 98.4 F (36.9 C) (Oral)   Resp 16   Ht 5\' 9"  (1.753 m)   Wt 120.8 kg   SpO2 98%   BMI 39.34 kg/m   Intake/Output Summary (Last 24 hours) at 03/26/2020 1021 Last data filed at 03/26/2020 0800 Gross per 24 hour  Intake 720 ml  Output  2900 ml  Net -2180 ml   Weight change: -0.816 kg  Physical Exam: Gen: Comfortably sleeping in bed, easy to awaken and engage in conversation CVS: Pulse regular rhythm, normal rate, S1 and S2 normal Resp: Clear to auscultation bilaterally, no distinct rales or rhonchi Abd: Soft, obese, nontender, bowel sounds normal Ext: Status post right BKA.  No left lower extremity edema  Imaging: No results found.  Labs: BMET Recent Labs  Lab 03/21/20 1411 03/21/20 2109 03/21/20 2232 03/22/20 8756 03/23/20 0213 03/24/20 0211 03/25/20 0152 03/25/20 2200 03/26/20 0116  NA 119* 122* 120* 127* 131* 131* 134*  --  132*  K 5.5* 5.5* 4.8 3.8 3.8 3.9 4.0  --  4.2  CL 83* 88* 87* 92* 96* 99 102  --  101  CO2 22  --  22 21* 23 20* 21*  --  20*  GLUCOSE 1,027* >700* 761* 387* 241* 313* 325* 400* 349*  BUN 29* 36* 30* 29* 38* 44* 41*  --  38*  CREATININE 2.29* 2.10* 2.33* 2.29* 3.87* 4.40* 4.29*  --  3.31*  CALCIUM 9.4  --  9.5 9.0 9.2 8.6* 8.7*  --  8.8*  PHOS  --   --   --   --   --  4.6 4.2  --  4.0   CBC Recent Labs  Lab 03/21/20 1411 03/21/20 2109 03/22/20 0312 03/24/20 0211  WBC 6.1  --  6.4 6.9  HGB 12.5* 15.0 11.0* 10.6*  HCT 38.2* 44.0 31.7* 32.3*  MCV 83.4  --  77.9* 80.5  PLT 244  --  251 239   Medications:    Marland Kitchen [  START ON 03/27/2020] amLODipine  10 mg Oral Daily  . aspirin  81 mg Oral Daily  . atorvastatin  80 mg Oral Daily  . bictegravir-emtricitabine-tenofovir AF  1 tablet Oral Daily  . carvedilol  25 mg Oral BID WC  . Chlorhexidine Gluconate Cloth  6 each Topical Q0600  . dapsone  100 mg Oral Daily  . heparin injection (subcutaneous)  5,000 Units Subcutaneous Q8H  . insulin aspart  0-20 Units Subcutaneous TID WC  . insulin aspart  0-5 Units Subcutaneous QHS  . insulin aspart  3 Units Subcutaneous TID WC  . [START ON 03/27/2020] insulin glargine  50 Units Subcutaneous Daily  . mupirocin ointment  1 application Nasal BID  . sodium chloride flush  3 mL Intravenous  Q12H  . ticagrelor  90 mg Oral BID   Elmarie Shiley, MD 03/26/2020, 10:21 AM

## 2020-03-26 NOTE — Progress Notes (Signed)
Progress Note  Patient Name: Keith [name] Date of Encounter: 03/26/2020  [facility] Cardiologist: Larae Grooms, MD   Subjective   No chest pain, no SOB  Inpatient Medications    Scheduled Meds: . amLODipine  5 mg Oral Daily  . aspirin  81 mg Oral Daily  . atorvastatin  80 mg Oral Daily  . bictegravir-emtricitabine-tenofovir AF  1 tablet Oral Daily  . carvedilol  25 mg Oral BID WC  . Chlorhexidine Gluconate Cloth  6 each Topical Q0600  . dapsone  100 mg Oral Daily  . heparin injection (subcutaneous)  5,000 Units Subcutaneous Q8H  . insulin aspart  0-20 Units Subcutaneous TID WC  . insulin aspart  0-5 Units Subcutaneous QHS  . insulin aspart  3 Units Subcutaneous TID WC  . insulin glargine  42 Units Subcutaneous Daily  . mupirocin ointment  1 application Nasal BID  . sodium chloride flush  3 mL Intravenous Q12H  . ticagrelor  90 mg Oral BID   Continuous Infusions: . sodium chloride     PRN Meds: sodium chloride, acetaminophen, dextrose, nitroGLYCERIN, ondansetron (ZOFRAN) IV, oxyCODONE, sodium chloride flush   Vital Signs    Vitals:   03/25/20 0842 03/25/20 1600 03/25/20 2114 03/26/20 0553  BP: (!) 133/91 (!) 144/76 126/77 (!) 163/96  Pulse:  71 69   Resp:  18 17   Temp:  98.8 F (37.1 C) 98.8 F (37.1 C) 98.1 F (36.7 C)  TempSrc:  Oral Oral Oral  SpO2:  98% 98% 98%  Weight:    120.8 kg  Height:        Intake/Output Summary (Last 24 hours) at 03/26/2020 0750 Last data filed at 03/26/2020 0500 Gross per 24 hour  Intake 483 ml  Output 3210 ml  Net -2727 ml   Last 3 Weights 03/26/2020 03/25/2020 03/24/2020  Weight (lbs) 266 lb 6.4 oz 268 lb 3.2 oz 270 lb 1 oz  Weight (kg) 120.838 kg 121.655 kg 122.5 kg  Some encounter information is confidential and restricted. Go to Review Flowsheets activity to see all data.      Telemetry    NSR - Personally Reviewed  ECG    n/a - Personally Reviewed  Physical Exam   GEN: No acute distress.    Neck: No JVD Cardiac: RRR, no murmurs, rubs, or gallops.  Respiratory: Clear to auscultation bilaterally. GI: Soft, nontender, non-distended  MS: No edema; No deformity. Neuro:  Nonfocal  Psych: Normal affect   Labs    High Sensitivity Troponin:   Recent Labs  Lab 03/21/20 2232 03/22/20 0312  TROPONINIHS 1,065* 1,427*      Chemistry Recent Labs  Lab 03/21/20 2232 03/22/20 0312 03/24/20 0211 03/25/20 0152 03/25/20 2200 03/26/20 0116  NA 120*   < > 131* 134*  --  132*  K 4.8   < > 3.9 4.0  --  4.2  CL 87*   < > 99 102  --  101  CO2 22   < > 20* 21*  --  20*  GLUCOSE 761*   < > 313* 325* 400* 349*  BUN 30*   < > 44* 41*  --  38*  CREATININE 2.33*   < > 4.40* 4.29*  --  3.31*  CALCIUM 9.5   < > 8.6* 8.7*  --  8.8*  PROT 8.9*  --   --   --   --   --   ALBUMIN 3.1*  --  2.4* 2.3*  --  2.5*  AST 25  --   --   --   --   --   ALT 17  --   --   --   --   --   ALKPHOS 97  --   --   --   --   --   BILITOT 0.6  --   --   --   --   --   GFRNONAA 32*   < > 15* 15*  --  21*  ANIONGAP 11   < > 12 11  --  11   < > = values in this interval not displayed.     Hematology Recent Labs  Lab 03/21/20 1411 03/21/20 2109 03/22/20 0312 03/24/20 0211  WBC 6.1  --  6.4 6.9  RBC 4.58  --  4.07* 4.01*  HGB 12.5* 15.0 11.0* 10.6*  HCT 38.2* 44.0 31.7* 32.3*  MCV 83.4  --  77.9* 80.5  MCH 27.3  --  27.0 26.4  MCHC 32.7  --  34.7 32.8  RDW 12.6  --  11.9 12.1  PLT 244  --  251 239    BNPNo results for input(s): BNP, PROBNP in the last 168 hours.   DDimer No results for input(s): DDIMER in the last 168 hours.   Radiology    No results found.  Cardiac Studies    Patient Profile     54 y.o.malewith a history of uncontrolled type 2 DM, CAD (with stenting of OM1 11/2018),HIV,right BKA, CKD, chronic back pain, diabetic neuropathy, onychomycosis, hyperlipidemia, and hypertension, who presented to the hospital with multiple symptoms after running out of his insulin 1 week  ago.Denied any chest pain but Ecg on admit showed evidence of acute inferolateral STEMI  Assessment & Plan    1. STEMI/CAD - received DES to OM1 - echo LVEF 55-60%, cannot eval WMAs, grade I DDx - medical therapy with ASA, brillinta, atorva 80, coreg 25mg  bid, no ACE/ARB due to renal function - no symptoms, continue curernt meds  - fairly mild right leg pain several inches below cath site. Cath site itself soft, no palpable hematoma, normal palpable femoral pulse. Monitor at this time, if progresses would obtain US.   2. AKI on CKD III - per nephrology, Cr downtrend to day to 3.3 tpday, peak of 4.4. Admit Cr 2.29 - thought to be contrast nephropathy.  3. Uncontrolled DM2 - CBG on admission 1027, HgbA1c >15.5 - per medicine service  4. Hyperlipidemia - 03/21/2020: Cholesterol 291; HDL 30; LDL Cholesterol 199; Triglycerides 310; VLDL 62 - was already on 80 mg lipitor - start vascepa at discharge - will likely need lipid clinic for PCSK9i - questionable compliance  5. HTN - remains elevated, increase norvasc to 10mg  daily  6. HIV - on antiretrovirals  For questions or updates, please contact [place] Please consult [email or web address] for contact info under        Signed, Carlyle Dolly, MD  03/26/2020, 7:50 AM

## 2020-03-27 ENCOUNTER — Other Ambulatory Visit (HOSPITAL_COMMUNITY): Payer: Self-pay | Admitting: Cardiovascular Disease

## 2020-03-27 DIAGNOSIS — E1165 Type 2 diabetes mellitus with hyperglycemia: Secondary | ICD-10-CM

## 2020-03-27 LAB — CBC WITH DIFFERENTIAL/PLATELET
Abs Immature Granulocytes: 0.04 10*3/uL (ref 0.00–0.07)
Basophils Absolute: 0 10*3/uL (ref 0.0–0.1)
Basophils Relative: 0 %
Eosinophils Absolute: 0.2 10*3/uL (ref 0.0–0.5)
Eosinophils Relative: 3 %
HCT: 34.1 % — ABNORMAL LOW (ref 39.0–52.0)
Hemoglobin: 10.9 g/dL — ABNORMAL LOW (ref 13.0–17.0)
Immature Granulocytes: 1 %
Lymphocytes Relative: 31 %
Lymphs Abs: 2.1 10*3/uL (ref 0.7–4.0)
MCH: 26.5 pg (ref 26.0–34.0)
MCHC: 32 g/dL (ref 30.0–36.0)
MCV: 83 fL (ref 80.0–100.0)
Monocytes Absolute: 0.5 10*3/uL (ref 0.1–1.0)
Monocytes Relative: 8 %
Neutro Abs: 3.8 10*3/uL (ref 1.7–7.7)
Neutrophils Relative %: 57 %
Platelets: 282 10*3/uL (ref 150–400)
RBC: 4.11 MIL/uL — ABNORMAL LOW (ref 4.22–5.81)
RDW: 12.4 % (ref 11.5–15.5)
WBC: 6.7 10*3/uL (ref 4.0–10.5)
nRBC: 0 % (ref 0.0–0.2)

## 2020-03-27 LAB — BASIC METABOLIC PANEL
Anion gap: 10 (ref 5–15)
BUN: 33 mg/dL — ABNORMAL HIGH (ref 6–20)
CO2: 20 mmol/L — ABNORMAL LOW (ref 22–32)
Calcium: 9 mg/dL (ref 8.9–10.3)
Chloride: 105 mmol/L (ref 98–111)
Creatinine, Ser: 2.98 mg/dL — ABNORMAL HIGH (ref 0.61–1.24)
GFR, Estimated: 24 mL/min — ABNORMAL LOW (ref >60)
Glucose, Bld: 250 mg/dL — ABNORMAL HIGH (ref 70–99)
Potassium: 4.8 mmol/L (ref 3.5–5.1)
Sodium: 135 mmol/L (ref 135–145)

## 2020-03-27 LAB — GLUCOSE, CAPILLARY
Glucose-Capillary: 221 mg/dL — ABNORMAL HIGH (ref 70–99)
Glucose-Capillary: 170 mg/dL — ABNORMAL HIGH (ref 70–99)
Glucose-Capillary: 121 mg/dL — ABNORMAL HIGH (ref 70–99)
Glucose-Capillary: 226 mg/dL — ABNORMAL HIGH (ref 70–99)

## 2020-03-27 LAB — HEMOGLOBIN A1C
Hgb A1c MFr Bld: >15.5 % — ABNORMAL HIGH (ref 4.8–5.6)
Mean Plasma Glucose: >398 mg/dL

## 2020-03-27 MED ORDER — GABAPENTIN 100 MG PO CAPS
100.0000 mg | ORAL_CAPSULE | Freq: Three times a day (TID) | ORAL | Status: DC
  Administered 2020-03-27 – 2020-03-28 (×3): 100 mg via ORAL
  Filled 2020-03-27 (×3): qty 1

## 2020-03-27 MED ORDER — OXYBUTYNIN CHLORIDE 5 MG PO TABS: 5.0000 mg | ORAL_TABLET | Freq: Two times a day (BID) | ORAL | 2 refills | Status: DC

## 2020-03-27 MED ORDER — OXYCODONE HCL 5 MG PO TABS: 10.0000 mg | ORAL_TABLET | Freq: Four times a day (QID) | ORAL | Status: DC | PRN

## 2020-03-27 MED ORDER — ASPIRIN 81 MG PO TBEC: 81.0000 mg | DELAYED_RELEASE_TABLET | Freq: Every day | ORAL | 12 refills | Status: DC

## 2020-03-27 MED ORDER — INSULIN GLARGINE 100 UNIT/ML SOLOSTAR PEN: 50.0000 [IU] | PEN_INJECTOR | Freq: Every day | SUBCUTANEOUS | 11 refills | Status: DC

## 2020-03-27 MED ORDER — ICOSAPENT ETHYL 1 G PO CAPS: 2.0000 g | ORAL_CAPSULE | Freq: Two times a day (BID) | ORAL | 3 refills | Status: DC

## 2020-03-27 MED ORDER — DAPSONE 100 MG PO TABS: 100.0000 mg | ORAL_TABLET | Freq: Every day | ORAL | 0 refills | Status: DC

## 2020-03-27 MED ORDER — ATORVASTATIN CALCIUM 80 MG PO TABS: 80.0000 mg | ORAL_TABLET | Freq: Every day | ORAL | 3 refills | Status: DC

## 2020-03-27 MED ORDER — TICAGRELOR 90 MG PO TABS: 90.0000 mg | ORAL_TABLET | Freq: Two times a day (BID) | ORAL | 0 refills | Status: DC

## 2020-03-27 MED ORDER — LEVEMIR 100 UNIT/ML ~~LOC~~ SOLN: 50.0000 [IU] | Freq: Every day | SUBCUTANEOUS | 3 refills | Status: DC

## 2020-03-27 MED ORDER — AMLODIPINE BESYLATE 10 MG PO TABS: 10.0000 mg | ORAL_TABLET | Freq: Every day | ORAL | 6 refills | Status: DC

## 2020-03-27 MED ORDER — CARVEDILOL 25 MG PO TABS: 25.0000 mg | ORAL_TABLET | Freq: Two times a day (BID) | ORAL | 6 refills | Status: DC

## 2020-03-27 MED ORDER — NITROGLYCERIN 0.4 MG SL SUBL: 0.4000 mg | SUBLINGUAL_TABLET | SUBLINGUAL | 12 refills | Status: DC | PRN

## 2020-03-27 MED ORDER — NOVOLOG FLEXPEN 100 UNIT/ML ~~LOC~~ SOPN: PEN_INJECTOR | SUBCUTANEOUS | 11 refills | Status: DC

## 2020-03-27 MED FILL — NITROGLYCERIN 0.4 MG TAB SL: 0.4 | 8 days supply | Qty: 25 | Fill #0

## 2020-03-27 MED FILL — LEVEMIR FLEXTOUCH 100 UNITS: 100 | 30 days supply | Qty: 15 | Fill #0

## 2020-03-27 MED FILL — VASCEPA 1 GM CAPSULE: 1 | 30 days supply | Qty: 120 | Fill #0

## 2020-03-27 MED FILL — OXYBUTYNIN CHLORIDE 5 MG TA: 5 | 30 days supply | Qty: 60 | Fill #0

## 2020-03-27 MED FILL — ATORVASTATIN CALCIUM 80 MG: 80 | 90 days supply | Qty: 90 | Fill #0

## 2020-03-27 MED FILL — AMLODIPINE BESYLATE 10 MG T: 10 | 30 days supply | Qty: 30 | Fill #0

## 2020-03-27 MED FILL — DAPSONE 100 MG TABS: 100 | 30 days supply | Qty: 30 | Fill #0

## 2020-03-27 MED FILL — BRILINTA 90 MG TABLET: 90 | 30 days supply | Qty: 60 | Fill #0

## 2020-03-27 MED FILL — PENTIPS 32G X 4 MM MISC: 32G X 4 MM | 25 days supply | Qty: 100 | Fill #0

## 2020-03-27 MED FILL — NOVOLOG FLEXPEN SYRINGE: 100 | 30 days supply | Qty: 9 | Fill #0

## 2020-03-27 MED FILL — CARVEDILOL 25 MG TABLET: 25 | 30 days supply | Qty: 60 | Fill #0

## 2020-03-27 NOTE — Progress Notes (Addendum)
Inpatient Diabetes Program Recommendations  AACE/ADA: New Consensus Statement on Inpatient Glycemic Control (2015)  Target Ranges:  Prepandial:   less than 140 mg/dL      Peak postprandial:   less than 180 mg/dL (1-2 hours)      Critically ill patients:  140 - 180 mg/dL   Lab Results  Component Value Date   GLUCAP 170 (H) 03/27/2020   HGBA1C >15.5 (H) 03/24/2020    Review of Glycemic Control Results for Keith [name], Keith [name] (MRN [id number]) as of 03/27/2020 13:04  Ref. Range 03/26/2020 21:12 03/27/2020 07:16 03/27/2020 11:18  Glucose-Capillary Latest Ref Range: 70 - 99 mg/dL 193 (H) 221 (H) 170 (H)   Diabetes history: DM2 Outpatient Diabetes medications: Levemir 20 units QD, Novolog 10 units tidwc, metformin 1000 mg bid Current orders for Inpatient glycemic control: IV insulin transitioning to Lantus 50 units QD, Novolog 0-20 units TID, Novolog 0-5 units QHS, Novolog 3 units TID  HgbA1C - 8.1% on 01/13/20.  Inpatient Diabetes Program Recommendations:    Spoke with patient again regarding outpatient diabetes management.  Assuming based on updated A1C, patient was out of insulin far beyond a week prior to admission. When asked, patient admits to frequently missing doses. Denies rationing insulin. But would also increase dosages when CBGs were elevated.  Reviewed patient's updated A1c of >15.5%. Explained what a A1c is and what it measures. Also reviewed goal A1c with patient, importance of good glucose control @ home, and blood sugar goals. Reviewed patho of DM, need for insulin, impact of missed doses, impact of infection/healing, vascular changes and commorbidities. Has a meter and testing supplies. Reviewed when to call MD. Also reviewed current inpatient dosages and encouraged to discuss with MD.  Reminded to take medications as prescribed and to reach out to PCP when unable to pick up new prescription. Per DM coordinator note from 12/15: "States he went to Pharmacy to get insulin and  they said it was too soon for a refill. Could not pick up for 10 days. States he must have given himself too much insulin, and then ran out. Instructed pt to call his MD if this happens again. Pt said he has $ 0.00 copay and usually takes insulin as prescribed. Denies any hypoglycemia." Patient to make another appointment with PCP.   Thanks, Bronson Curb, MSN, RNC-OB Diabetes Coordinator [phone] (8a-5p)

## 2020-03-27 NOTE — Care Management Important Message (Signed)
Important Message  Patient Details  Name: Keith [name] MRN: [id number] Date of Birth: 1966/03/02   Medicare Important Message Given:  Yes     Shelda Altes 03/27/2020, 12:23 PM

## 2020-03-27 NOTE — Plan of Care (Signed)
  Problem: Education: Goal: Knowledge of General Education information will improve Description: Including pain rating scale, medication(s)/side effects and non-pharmacologic comfort measures Outcome: Progressing   Problem: Health Behavior/Discharge Planning: Goal: Ability to manage health-related needs will improve Outcome: Progressing   Problem: Clinical Measurements: Goal: Ability to maintain clinical measurements within normal limits will improve Outcome: Progressing Goal: Will remain free from infection Outcome: Progressing Goal: Diagnostic test results will improve Outcome: Progressing Goal: Respiratory complications will improve Outcome: Progressing Goal: Cardiovascular complication will be avoided Outcome: Progressing   Problem: Activity: Goal: Risk for activity intolerance will decrease Outcome: Progressing   Problem: Nutrition: Goal: Adequate nutrition will be maintained Outcome: Progressing   Problem: Coping: Goal: Level of anxiety will decrease Outcome: Progressing   Problem: Elimination: Goal: Will not experience complications related to bowel motility Outcome: Progressing Goal: Will not experience complications related to urinary retention Outcome: Progressing   Problem: Pain Managment: Goal: General experience of comfort will improve Outcome: Progressing   Problem: Safety: Goal: Ability to remain free from injury will improve Outcome: Progressing   Problem: Education: Goal: Understanding of CV disease, CV risk reduction, and recovery process will improve Outcome: Progressing   Problem: Activity: Goal: Ability to return to baseline activity level will improve Outcome: Progressing   Problem: Cardiovascular: Goal: Ability to achieve and maintain adequate cardiovascular perfusion will improve Outcome: Progressing   Problem: Health Behavior/Discharge Planning: Goal: Ability to safely manage health-related needs after discharge will  improve Outcome: Progressing

## 2020-03-27 NOTE — Progress Notes (Addendum)
PROGRESS NOTE  MAURISIO RUDDY UUE:[id number] DOB: [birth year] DOA: 03/21/2020 PCP: [name], FNP  Brief History   [name] is a 54 y.o. male with medical history significant of HIV on HAART, R BKA, CKD now progressed to stage 4 , DM2, HTN.  History of MI in 11/2018, got OM1 stent placement with DES at that time.  Pt ran out of needles / insulin so hasnt been taking insulin for a 1 week prior to admission.   Presented to ED with c/o nausea, vomiting, blurry vision, abd pain, polyuria, and polydipsia.  Symptoms are severe, not helped by drinking mountain dew .  He was noted to have an acute STEMI.  [facility] were consulted to admit the patient for further evaluation and care. He was admitted to a telemetry bed. Cardiology and nephrology were consulted.  The patient underwent LHC on 03/23/2020. He required DES to OM1. He will be continued on ASA and Brillinta for DAPT.   The patient's glucoses remain difficulty to control.  Consultants  . Cardiology . Nephrology  Procedures  . LHC with placement of DES in OM1  Antibiotics   Anti-infectives (From admission, onward)   Start     Dose/Rate Route Frequency Ordered Stop   03/28/20 0000  dapsone 100 MG tablet        100 mg Oral Daily 03/27/20 1153     03/22/20 1415  dapsone tablet 100 mg        100 mg Oral Daily 03/22/20 1321     03/22/20 1000  bictegravir-emtricitabine-tenofovir AF (BIKTARVY) 50-200-25 MG per tablet 1 tablet        1 tablet Oral Daily 03/22/20 0125        Subjective  The patient is resting comfortably. He is complaining of phantom pain in the right leg that is not managed by current pain regimen. I have decreased his oxycodone back to his PTA dose and added neurontin.  Objective   Vitals:  Vitals:   03/27/20 1400 03/27/20 1605  BP:  (!) 122/16  Pulse:  65  Resp: 19 18  Temp:  98 F (36.7 C)  SpO2:  99%   Exam:  Constitutional:  . The patient is awake, alert, and oriented x 3. No acute  distress. Respiratory:  . No increased work of breathing. . No wheezes, rales, or rhonchi . No tactile fremitus Cardiovascular:  . Regular rate and rhythm . No murmurs, ectopy, or gallups. . No lateral PMI. No thrills. Abdomen:  . Abdomen is soft, non-tender, non-distended . No hernias, masses, or organomegaly . Normoactive bowel sounds.  Musculoskeletal:  . No cyanosis, clubbing, or edema Skin:  . No rashes, lesions, ulcers . palpation of skin: no induration or nodules Neurologic:  . CN 2-12 intact . Sensation all 4 extremities intact Psychiatric:  . Mental status o Mood, affect appropriate o Orientation to person, place, time  . judgment and insight appear intact  I have personally reviewed the following:   Today's Data  . Vitals, BMP, Glucoses  Micro Data  . MRSA by PCR  Cardiology Data  . EKG . Echocardiogram . LHC  Scheduled Meds: . amLODipine  10 mg Oral Daily  . aspirin  81 mg Oral Daily  . atorvastatin  80 mg Oral Daily  . bictegravir-emtricitabine-tenofovir AF  1 tablet Oral Daily  . carvedilol  25 mg Oral BID WC  . dapsone  100 mg Oral Daily  . gabapentin  100 mg Oral TID  .  heparin injection (subcutaneous)  5,000 Units Subcutaneous Q8H  . insulin aspart  0-20 Units Subcutaneous TID WC  . insulin aspart  0-5 Units Subcutaneous QHS  . insulin aspart  3 Units Subcutaneous TID WC  . insulin glargine  50 Units Subcutaneous Daily  . sodium chloride flush  3 mL Intravenous Q12H  . ticagrelor  90 mg Oral BID   Continuous Infusions: . sodium chloride      Principal Problem:   Acute ST elevation myocardial infarction (STEMI) due to occlusion of circumflex coronary artery (HCC) Active Problems:   Essential hypertension   HIV disease (HCC)   STEMI (ST elevation myocardial infarction) (HCC)   CKD (chronic kidney disease) stage 4, GFR 15-29 ml/min (HCC)   Uncontrolled type 2 diabetes mellitus with hyperglycemia (HCC)   LOS: 5 days   A & P  Acute  STEMI - S/p drug-eluting stent to OM1, just proximal to the 11/2018 stent he had put in [place].  Currently on dual antiplatelets.  Cardiology has signed off.   Leg pain: The patient is complaining of pain in the right lower extremity. Oxycodone increased, but did not improve pain. I have reduced it back to the patient's prior to admission dose. Neurontin started.   Acute kidney injury on chronic kidney disease stage IV.  Likely secondary to contrast nephropathy.  Will continue with IV fluids.  Nephrology consult appreciated. Creatinine decreased to 3.31 from 4.29 yesterday. Nephrology has signed off.   DM2 with hyperglycemia: Glucoses remain elevated Lantus increased. SSI increased to resistant. Prandial insulin added. Continue to monitor. Glucoses over the last 24 hours have been 121-221. Will monitor glucoses overnight and discharge to home in the morning if they remain controlled.   HIV - Continue HAART Not clear if he is compliant with this at home.  Added to admission dapsone. CD4 count in May was only 89, and viral load was 7280. He has been started on Dapsone and Biktarvy. He will need to follow up with ID as outpatient.   HTN - Continue with current medications.   Pressure injury: POA   Pressure Injury 11/10/18 Leg Right Stage II -  Partial thickness loss of dermis presenting as a shallow open ulcer with a red, pink wound bed without slough. (Active)  11/10/18 1857  Location: Leg  Location Orientation: Right  Staging: Stage II -  Partial thickness loss of dermis presenting as a shallow open ulcer with a red, pink wound bed without slough.  Wound Description (Comments):   Present on Admission: Yes    I have seen and examined this patient myself. I have spent 30 minutes in his evaluation and care.  DVT prophylaxis: heparin injection 5,000 Units Start: 03/23/20 1400 Code Status: Full code Family Communication: Discussed with patient   Disposition: Status is: Inpatient   Remains  inpatient appropriate because:Persistent severe electrolyte disturbances and worsening renal failure.   Dispo: The patient is from: Home              Anticipated d/c is to: Home              Anticipated d/c date is: 2 days              Patient currently is not medically stable to d/c.  Fleur Audino, DO [facility] Direct contact: see [email or web address]  7PM-7AM contact night coverage as above 03/27/2020, 5:24 PM  LOS: 2 days

## 2020-03-27 NOTE — Progress Notes (Signed)
[name], male   DOB: Nov 24, 1965, 54 y.o.   MRN: [id number] S: Feels well, no complaints O:BP 126/77 (BP Location: Left Arm)   Pulse 66   Temp 98.1 F (36.7 C) (Oral)   Resp 17   Ht 5\' 9"  (1.753 m)   Wt 121 kg   SpO2 99%   BMI 39.38 kg/m   Intake/Output Summary (Last 24 hours) at 03/27/2020 0847 Last data filed at 03/26/2020 2141 Gross per 24 hour  Intake 503 ml  Output 475 ml  Net 28 ml   Intake/Output: I/O last 3 completed shifts: In: 1223 [P.O.:1220; I.V.:3] Out: 2525 [Urine:2525]  Intake/Output this shift:  No intake/output data recorded. Weight change: 0.132 kg Gen: NAD CVS:RRR no rub Resp: CTA Abd: +BS, soft, NT/ND Ext: s/p RBKA, trace pretibial edema on left  Recent Labs  Lab 03/21/20 2232 03/22/20 0312 03/23/20 0213 03/24/20 0211 03/25/20 0152 03/25/20 2200 03/26/20 0116 03/27/20 0327  NA 120* 127* 131* 131* 134*  --  132* 135  K 4.8 3.8 3.8 3.9 4.0  --  4.2 4.8  CL 87* 92* 96* 99 102  --  101 105  CO2 22 21* 23 20* 21*  --  20* 20*  GLUCOSE 761* 387* 241* 313* 325* 400* 349* 250*  BUN 30* 29* 38* 44* 41*  --  38* 33*  CREATININE 2.33* 2.29* 3.87* 4.40* 4.29*  --  3.31* 2.98*  ALBUMIN 3.1*  --   --  2.4* 2.3*  --  2.5*  --   CALCIUM 9.5 9.0 9.2 8.6* 8.7*  --  8.8* 9.0  PHOS  --   --   --  4.6 4.2  --  4.0  --   AST 25  --   --   --   --   --   --   --   ALT 17  --   --   --   --   --   --   --    Liver Function Tests: Recent Labs  Lab 03/21/20 2232 03/24/20 0211 03/25/20 0152 03/26/20 0116  AST 25  --   --   --   ALT 17  --   --   --   ALKPHOS 97  --   --   --   BILITOT 0.6  --   --   --   PROT 8.9*  --   --   --   ALBUMIN 3.1* 2.4* 2.3* 2.5*   No results for input(s): LIPASE, AMYLASE in the last 168 hours. No results for input(s): AMMONIA in the last 168 hours. CBC: Recent Labs  Lab 03/21/20 1411 03/21/20 2109 03/22/20 0312 03/24/20 0211 03/27/20 0327  WBC 6.1  --  6.4 6.9 6.7  NEUTROABS  --   --   --   --  3.8   HGB 12.5*   < > 11.0* 10.6* 10.9*  HCT 38.2*   < > 31.7* 32.3* 34.1*  MCV 83.4  --  77.9* 80.5 83.0  PLT 244  --  251 239 282   < > = values in this interval not displayed.   Cardiac Enzymes: No results for input(s): CKTOTAL, CKMB, CKMBINDEX, TROPONINI in the last 168 hours. CBG: Recent Labs  Lab 03/26/20 0742 03/26/20 1120 03/26/20 1637 03/26/20 2112 03/27/20 0716  GLUCAP 362* 219* 192* 193* 221*    Iron Studies: No results for input(s): IRON, TIBC, TRANSFERRIN, FERRITIN in the last 72 hours. Studies/Results: No results found. Marland Kitchen  amLODipine  10 mg Oral Daily  . aspirin  81 mg Oral Daily  . atorvastatin  80 mg Oral Daily  . bictegravir-emtricitabine-tenofovir AF  1 tablet Oral Daily  . carvedilol  25 mg Oral BID WC  . dapsone  100 mg Oral Daily  . heparin injection (subcutaneous)  5,000 Units Subcutaneous Q8H  . insulin aspart  0-20 Units Subcutaneous TID WC  . insulin aspart  0-5 Units Subcutaneous QHS  . insulin aspart  3 Units Subcutaneous TID WC  . insulin glargine  50 Units Subcutaneous Daily  . sodium chloride flush  3 mL Intravenous Q12H  . ticagrelor  90 mg Oral BID    BMET    Component Value Date/Time   NA 135 03/27/2020 0327   NA 132 (L) 09/07/2018 1056   K 4.8 03/27/2020 0327   CL 105 03/27/2020 0327   CO2 20 (L) 03/27/2020 0327   GLUCOSE 250 (H) 03/27/2020 0327   BUN 33 (H) 03/27/2020 0327   BUN 21 09/07/2018 1056   CREATININE 2.98 (H) 03/27/2020 0327   CREATININE 2.39 (H) 09/01/2019 1608   CALCIUM 9.0 03/27/2020 0327   GFRNONAA 24 (L) 03/27/2020 0327   GFRNONAA 27 (L) 01/20/2017 1244   GFRAA 35 (L) 08/23/2019 0904   GFRAA 31 (L) 01/20/2017 1244   CBC    Component Value Date/Time   WBC 6.7 03/27/2020 0327   RBC 4.11 (L) 03/27/2020 0327   HGB 10.9 (L) 03/27/2020 0327   HGB 12.1 (L) 09/07/2018 1056   HCT 34.1 (L) 03/27/2020 0327   HCT 37.5 09/07/2018 1056   PLT 282 03/27/2020 0327   PLT 252 09/07/2018 1056   MCV 83.0 03/27/2020 0327   MCV  82 09/07/2018 1056   MCH 26.5 03/27/2020 0327   MCHC 32.0 03/27/2020 0327   RDW 12.4 03/27/2020 0327   RDW 12.6 09/07/2018 1056   LYMPHSABS 2.1 03/27/2020 0327   LYMPHSABS 1.4 09/07/2018 1056   MONOABS 0.5 03/27/2020 0327   EOSABS 0.2 03/27/2020 0327   EOSABS 0.1 09/07/2018 1056   BASOSABS 0.0 03/27/2020 0327   BASOSABS 0.0 09/07/2018 1056     Assessment/Plan:  1. AKI/CKD stage IIIb- presumably due to contrast-induced nephropathy and intravascular volume depletion due to osmotic diuresis/hyperglycemia.  Scr peaked at 4.4 but has continued to improve with good UOP.  Nothing further to add at this time and will arrange for outpatient follow up with Dr. Posey Pronto in our office.  Will sign off.  Please call with questions or concerns.  2. STEMI- s/p cath with PTA and DES placement to OM1.  CP free 3. IDDM- poorly controlled per primary svc 4. HTN- stable 5. HIV- continue with antiretroviral therapy and stressed compliance with meds. 6. Anemia of CKD stage IIIb and HIV- stable. 7. Hyponatremia- due to #1 and hyperglycemia.  Stable and asymptomatic.  Donetta Potts, MD [employer] [phone]

## 2020-03-27 NOTE — Progress Notes (Addendum)
Progress Note  Patient Name: Keith [name] Date of Encounter: 03/27/2020  [name] Cardiologist: Larae Grooms, MD   Subjective   Pt only complaint today is phantom limb pain - he is concerned about his contract with pain management and states he requires higher doses.  Inpatient Medications    Scheduled Meds: . amLODipine  10 mg Oral Daily  . aspirin  81 mg Oral Daily  . atorvastatin  80 mg Oral Daily  . bictegravir-emtricitabine-tenofovir AF  1 tablet Oral Daily  . carvedilol  25 mg Oral BID WC  . dapsone  100 mg Oral Daily  . heparin injection (subcutaneous)  5,000 Units Subcutaneous Q8H  . insulin aspart  0-20 Units Subcutaneous TID WC  . insulin aspart  0-5 Units Subcutaneous QHS  . insulin aspart  3 Units Subcutaneous TID WC  . insulin glargine  50 Units Subcutaneous Daily  . sodium chloride flush  3 mL Intravenous Q12H  . ticagrelor  90 mg Oral BID   Continuous Infusions: . sodium chloride     PRN Meds: sodium chloride, acetaminophen, dextrose, nitroGLYCERIN, ondansetron (ZOFRAN) IV, oxyCODONE, sodium chloride flush   Vital Signs    Vitals:   03/26/20 0800 03/26/20 2154 03/27/20 0328 03/27/20 0400  BP: 115/76  (!) 157/93   Pulse:   67   Resp: 16 18 17    Temp: 98.4 F (36.9 C)   98.4 F (36.9 C)  TempSrc: Oral   Oral  SpO2:   99%   Weight:   121 kg   Height:        Intake/Output Summary (Last 24 hours) at 03/27/2020 0707 Last data filed at 03/26/2020 2141 Gross per 24 hour  Intake 743 ml  Output 775 ml  Net -32 ml   Last 3 Weights 03/27/2020 03/26/2020 03/25/2020  Weight (lbs) 266 lb 11 oz 266 lb 6.4 oz 268 lb 3.2 oz  Weight (kg) 120.97 kg 120.838 kg 121.655 kg  Some encounter information is confidential and restricted. Go to Review Flowsheets activity to see all data.      Telemetry    Sinus rhythm in the 60s - Personally Reviewed  ECG    No new tracings - Personally Reviewed  Physical Exam   GEN: No acute distress.    Neck: No JVD Cardiac: RRR, no murmurs, rubs, or gallops.  Respiratory: Clear to auscultation bilaterally. GI: Soft, nontender, non-distended  MS: No edema; No deformity. Neuro:  Nonfocal  Psych: Normal affect  Groin site soft, no hematoma  Labs    High Sensitivity Troponin:   Recent Labs  Lab 03/21/20 2232 03/22/20 0312  TROPONINIHS 1,065* 1,427*      Chemistry Recent Labs  Lab 03/21/20 2232 03/22/20 0312 03/24/20 0211 03/25/20 0152 03/25/20 2200 03/26/20 0116 03/27/20 0327  NA 120*   < > 131* 134*  --  132* 135  K 4.8   < > 3.9 4.0  --  4.2 4.8  CL 87*   < > 99 102  --  101 105  CO2 22   < > 20* 21*  --  20* 20*  GLUCOSE 761*   < > 313* 325* 400* 349* 250*  BUN 30*   < > 44* 41*  --  38* 33*  CREATININE 2.33*   < > 4.40* 4.29*  --  3.31* 2.98*  CALCIUM 9.5   < > 8.6* 8.7*  --  8.8* 9.0  PROT 8.9*  --   --   --   --   --   --  ALBUMIN 3.1*  --  2.4* 2.3*  --  2.5*  --   AST 25  --   --   --   --   --   --   ALT 17  --   --   --   --   --   --   ALKPHOS 97  --   --   --   --   --   --   BILITOT 0.6  --   --   --   --   --   --   GFRNONAA 32*   < > 15* 15*  --  21* 24*  ANIONGAP 11   < > 12 11  --  11 10   < > = values in this interval not displayed.     Hematology Recent Labs  Lab 03/22/20 0312 03/24/20 0211 03/27/20 0327  WBC 6.4 6.9 6.7  RBC 4.07* 4.01* 4.11*  HGB 11.0* 10.6* 10.9*  HCT 31.7* 32.3* 34.1*  MCV 77.9* 80.5 83.0  MCH 27.0 26.4 26.5  MCHC 34.7 32.8 32.0  RDW 11.9 12.1 12.4  PLT 251 239 282    BNPNo results for input(s): BNP, PROBNP in the last 168 hours.   DDimer No results for input(s): DDIMER in the last 168 hours.   Radiology    No results [name].  Cardiac Studies   Left heart cath 03/22/20:  Mid Cx lesion is 60% stenosed.  1st Mrg-1 lesion is 95% stenosed.  Previously placed 1st Mrg-2 stent (unknown type) is widely patent.  Dist LAD-1 lesion is 80% stenosed.  Dist LAD-2 lesion is 90% stenosed.  Dist LAD-3 lesion  is 90% stenosed.  Dist RCA lesion is 50% stenosed.  The left ventricular systolic function is normal.  LV end diastolic pressure is normal.  The left ventricular ejection fraction is 50-55% by visual estimate.  IMPRESSION: Successful OM1 ostial PCI drug-eluting stenting in the setting of inferolateral STEMI.  The patient was asymptomatic upon presentation and throughout the case.  There was TIMI-2 flow initially and TIMI-3 flow at the end of the case.  This was the only lesion that could potentially be implicated with the EKG changes.  The RCA was widely patent.  It did have an anterior takeoff.  The LAD had diffuse apical disease.  The EF appeared normal with hand-injection.  The patient will need dual antiplatelet therapy uninterrupted for at least 12 months.  2D echo will be obtained.  Aggressive risk factor modification will be recommended.  The patient did have severe hyper glycemia which will need to be addressed during his hospitalization.    Echo 03/22/20: 1. Technically difficult study with poor acoustical windows and patient  cooperation even with definity contrast. Left ventricular ejection  fraction, by estimation, is 55 to 60%. The left ventricle has normal  function. Left ventricular endocardial border  not optimally defined to evaluate regional wall motion but grossly appear  normal. There is mild concentric left ventricular hypertrophy. Left  ventricular diastolic parameters are consistent with Grade I diastolic  dysfunction (impaired relaxation).  2. Right ventricular systolic function is normal. The right ventricular  size is normal. Tricuspid regurgitation signal is inadequate for assessing  PA pressure.  3. The mitral valve is normal in structure. No evidence of mitral valve  regurgitation. No evidence of mitral stenosis.  4. The aortic valve is normal in structure. Aortic valve regurgitation is  not visualized. No aortic stenosis is present.  5. Aortic  dilatation  noted. There is mild dilatation of the ascending  aorta and of the aortic root, measuring 38 mm.  6. The inferior vena cava is normal in size with greater than 50%  respiratory variability, suggesting right atrial pressure of 3 mmHg.   Patient Profile     54 y.o. male with a history of uncontrolled type 2 DM, CAD (with stenting of OM1 11/2018),HIV,right BKA, CKD, chronic back pain, diabetic neuropathy, onychomycosis, hyperlipidemia, and hypertension, who presented to the hospital with multiple symptoms after running out of his insulin 1 week ago.Denied any chest pain but Ecg on admit showed evidence of acute inferolateral STEMI.  Assessment & Plan    STEMI CAD - inferolateral STEMI treated with DES to OM1 - echo with preserved EF, grade 1 DD - placed on ASA and brilinta - continue 80 mg lipitor, 25 mg coreg BID - no ACE/ARB/ARNI due to renal function - pt has been complaining of right groin pain - cath site soft, no hematoma   Acute on chronic kidney disease stage III - nephrology following - appreciate recs - sCr trending down from a peak of 4.4 to 2.98 with K 4.8 - suspect contrast nephropathy - will need BMP later this week   Hypertension - medications as above, have added amlodipine and titrated to 10 mg - follow pressure today   Hyperlipidemia with LDL goal < 70 03/21/2020: Cholesterol 291; HDL 30; LDL Cholesterol 199; Triglycerides 310; VLDL 62 - continue 80 mg lipitor - repeat lipids in 6 weeks   Uncontrolled DM2 - presented after running out of insulin x 1 week - A1c > 15.5% - per IM   HIV - continue dapsone and biktarvy   Disposition: Stable from a cardiology standpoint. Nephrology has signed off. Will await guidance from Medicine service regarding insulin needs for discharge.   For questions or updates, please contact [place] Please consult [email or web address] for contact info under        Signed, Ledora Bottcher, PA   03/27/2020, 7:07 AM    Agree with note by Fabian Sharp PA-C  Keith [name] was admitted on 03/21/2020 with DKA and inferolateral STEMI.  He had subtotal occlusion of the first obtuse marginal branch ostium which had previously been stented just beyond that.  I placed a stent at the ostium.  He had otherwise noncritical circumflex and distal right disease with high-grade distal and apical LAD disease not amenable to PCI.  His LV function was normal.  His troponins peaked at 1400.  His EKG did inferolateral ST segment elevation.  His serum glucose was approxi-1000 Keith [name] and had run out of his insulin at home.  He did have radiocontrast nephropathy with a serum creatinine that peaked at 4.4 (2.29 on admission) and is slowly declined to 2.98 today.  His exam is benign.  He has been chest pain-free throughout his admission.  Appreciate medicine's help in managing his metabolic issues.  He is on dual antiplatelet therapy.  Okay for discharge from cardiac point of view if medicine feels comfortable with current medication regimen.  He will need close outpatient follow-up.  Counseled on medication compliance.  [name], M.D., [place], [facility], [place] [address] [place],   [address]  [phone] 03/27/2020 10:21 AM

## 2020-03-28 ENCOUNTER — Other Ambulatory Visit (HOSPITAL_COMMUNITY): Payer: Self-pay | Admitting: Internal Medicine

## 2020-03-28 DIAGNOSIS — E1165 Type 2 diabetes mellitus with hyperglycemia: Secondary | ICD-10-CM | POA: Diagnosis not present

## 2020-03-28 DIAGNOSIS — I2121 ST elevation (STEMI) myocardial infarction involving left circumflex coronary artery: Secondary | ICD-10-CM | POA: Diagnosis not present

## 2020-03-28 DIAGNOSIS — I10 Essential (primary) hypertension: Secondary | ICD-10-CM | POA: Diagnosis not present

## 2020-03-28 LAB — BASIC METABOLIC PANEL
Anion gap: 10 (ref 5–15)
BUN: 33 mg/dL — ABNORMAL HIGH (ref 6–20)
CO2: 20 mmol/L — ABNORMAL LOW (ref 22–32)
Calcium: 8.9 mg/dL (ref 8.9–10.3)
Chloride: 102 mmol/L (ref 98–111)
Creatinine, Ser: 2.96 mg/dL — ABNORMAL HIGH (ref 0.61–1.24)
GFR, Estimated: 24 mL/min — ABNORMAL LOW (ref >60)
Glucose, Bld: 347 mg/dL — ABNORMAL HIGH (ref 70–99)
Potassium: 4.8 mmol/L (ref 3.5–5.1)
Sodium: 132 mmol/L — ABNORMAL LOW (ref 135–145)

## 2020-03-28 LAB — GLUCOSE, CAPILLARY
Glucose-Capillary: 294 mg/dL — ABNORMAL HIGH (ref 70–99)
Glucose-Capillary: 298 mg/dL — ABNORMAL HIGH (ref 70–99)

## 2020-03-28 MED ORDER — INSULIN ASPART 100 UNIT/ML ~~LOC~~ SOLN
5.0000 [IU] | Freq: Three times a day (TID) | SUBCUTANEOUS | Status: DC
  Administered 2020-03-28: 5 [IU] via SUBCUTANEOUS

## 2020-03-28 MED ORDER — INSULIN ASPART 100 UNIT/ML ~~LOC~~ SOLN: 8.0000 [IU] | Freq: Every day | SUBCUTANEOUS | Status: DC

## 2020-03-28 MED ORDER — INSULIN GLARGINE 100 UNIT/ML ~~LOC~~ SOLN: 55.0000 [IU] | Freq: Every day | SUBCUTANEOUS | Status: DC

## 2020-03-28 MED ORDER — INSULIN DETEMIR 100 UNIT/ML ~~LOC~~ SOLN
55.0000 [IU] | SUBCUTANEOUS | Status: DC
  Filled 2020-03-28: qty 0.55

## 2020-03-28 MED ORDER — LEVEMIR 100 UNIT/ML ~~LOC~~ SOLN
55.0000 [IU] | Freq: Every day | SUBCUTANEOUS | 3 refills | Status: DC
Start: 2020-03-28 — End: 2020-05-03

## 2020-03-28 MED ORDER — GABAPENTIN 100 MG PO CAPS: 100.0000 mg | ORAL_CAPSULE | Freq: Three times a day (TID) | ORAL | 0 refills | Status: DC

## 2020-03-28 MED ORDER — INSULIN GLARGINE 100 UNIT/ML ~~LOC~~ SOLN: 50.0000 [IU] | Freq: Every day | SUBCUTANEOUS | 11 refills | Status: DC

## 2020-03-28 MED FILL — GABAPENTIN 100 MG CAPSULE: 100 | 30 days supply | Qty: 90 | Fill #0

## 2020-03-28 NOTE — Discharge Instructions (Signed)
PLEASE REMEMBER TO BRING ALL OF YOUR MEDICATIONS TO EACH OF YOUR FOLLOW-UP OFFICE VISITS. ° °PLEASE ATTEND ALL SCHEDULED FOLLOW-UP APPOINTMENTS.  ° °Activity: Increase activity slowly as tolerated. You may shower, but no soaking baths (or swimming) for 1 week. No driving for 24 hours. No lifting over 5 lbs for 1 week. No sexual activity for 1 week.  ° °You May Return to Work: in 1 week (if applicable) ° °Wound Care: You may wash cath site gently with soap and water. Keep cath site clean and dry. If you notice pain, swelling, bleeding or pus at your cath site, please call [phone]. ° °

## 2020-03-28 NOTE — Progress Notes (Signed)
Patient chose to leave without receiving his daily of Levemir. He received his discharge papers and medications from pharmacy.

## 2020-03-28 NOTE — Discharge Summary (Signed)
Physician Discharge Summary  Keith [name] NTI:[id number] DOB: 1965/06/08 DOA: 03/21/2020  PCP: [name], FNP  Admit date: 03/21/2020 Discharge date: 03/28/2020  Recommendations for Outpatient Follow-up:  1. Discharge to home 2. Follow up with PCP in 7-10 days. 3. Check glucoses once before breakfast in the am and once in the evening before dinner. Write these down and take them into visit with PCP. 4. Follow up with cardiology as directed.   Follow-up Information    Charlie Pitter, PA-C Follow up on 04/14/2020.   Specialties: Cardiology, Radiology Why: 11:15 am for hospital follow up Contact information: [address] [place] [address] [phone]                Discharge Diagnoses: Principal diagnosis is #1 1. Acute STEMI 2. Leg pain/phantom pain 3. AKI 4. DES to OM 1 - DAPT 5. DM II: Uncontrolled 6. HIV 7. Hypertension  Discharge Condition: Fair  Disposition: Home  Diet recommendation: Heart healthy with modified carbohydrates  Filed Weights   03/26/20 0553 03/27/20 0328 03/28/20 0601  Weight: 120.8 kg 121 kg 121.7 kg    History of present illness: Keith [name] is a 54 y.o. male with a history of uncontrolled type 2 DM, CAD (with stenting of OM1 11/2018), HIV, right BKA, CKD, chronic back pain, diabetic neuropathy, onychomycosis, hyperlipidemia, and hypertension, who presented to the hospital with multiple symptoms after running out of his insulin 1 week ago. He has had nausea, vomiting, blurry vision, abdominal pain, diarrhea and polyuria. He has been consuming large amounts of fluids. For some reason he also drank "[facility]" since his friend advised him to take it. He himself is a poor historian.  He had a cardiac catheterization procedure in August 2020 when a stent was placed in the OM1 branch. He was also documented to have severe diffuse disease (90%) in the distal LAD. He does not recall taking his ASA recently. He  also gives vague answers regarding his other medications.  During the current hospitalization, in the ED, the vital signs were: 141/117 mmHg, HR 90 bpm. The labs were:  NA 122, K 5.5, Gluc >700, BUN 36, Cr 2.1, Hgb 15, and Hct 44.  The ECG showed acute ST elevations in the inferior and anterolateral leads.  Hospital Course:  Keith [name] a 54 y.o.malewith medical history significant ofHIV on HAART, R BKA, CKD now progressed to stage 4 , DM2, HTN.History ofMI in 11/2018, got OM1 stent placement with DES at that time. Pt ran out of needles / insulin so hasnt been taking insulin fora1 week prior to admission.Presentedto ED with c/o nausea, vomiting, blurry vision, abd pain, polyuria, and polydipsia. Symptoms are severe, not helped by drinking mountain dew.He was noted to have an acute STEMI.  [facility] were consulted to admit the patient for further evaluation and care. He was admitted to a telemetry bed. Cardiology and nephrology were consulted.  The patient underwent LHC on 03/23/2020. He required DES to OM1. He will be continued on ASA and Brillinta for DAPT.   The patient's glucoses remained difficulty to control. He was sent home on Levemir 55 units daily with novolog 8 units with each meal. He is to check his sugars twice a day and report glucoses to his PCP.  Today's assessment: S: The patient is resting comfortably. No new complaints. O: Vitals:  Vitals:   03/28/20 0930 03/28/20 1044  BP: 103/74 (!) 137/93  Pulse:    Resp:  19 12  Temp:    SpO2:     Exam:  Constitutional:   The patient is awake, alert, and oriented x 3. No acute distress. Respiratory:   No increased work of breathing.  No wheezes, rales, or rhonchi  No tactile fremitus Cardiovascular:   Regular rate and rhythm  No murmurs, ectopy, or gallups.  No lateral PMI. No thrills. Abdomen:   Abdomen is soft, non-tender, non-distended  No hernias, masses, or  organomegaly  Normoactive bowel sounds.  Musculoskeletal:   No cyanosis, clubbing, or edema Skin:   No rashes, lesions, ulcers  palpation of skin: no induration or nodules Neurologic:   CN 2-12 intact  Sensation all 4 extremities intact Psychiatric:   Mental status ? Mood, affect appropriate ? Orientation to person, place, time   judgment and insight appear intact   Discharge Instructions  Discharge Instructions    AMB Referral to Cardiac Rehabilitation - Phase II   Complete by: As directed    Diagnosis: STEMI   After initial evaluation and assessments completed: Virtual Based Care may be provided alone or in conjunction with Phase 2 Cardiac Rehab based on patient barriers.: Yes   Activity as tolerated - No restrictions   Complete by: As directed    Call MD for:  persistant nausea and vomiting   Complete by: As directed    Call MD for:  severe uncontrolled pain   Complete by: As directed    Diet - low sodium heart healthy   Complete by: As directed    Discharge instructions   Complete by: As directed    Discharge to home Follow up with PCP in 7-10 days. Check glucoses once before breakfast in the am and once in the evening before dinner. Write these down and take them into visit with PCP. Follow up with cardiology as directed.   Increase activity slowly   Complete by: As directed      Allergies as of 03/28/2020      Reactions   Sulfur Itching   Patient stated he's allergic to "sulfur" AND "sulfa"   Sulfa Antibiotics Itching      Medication List    STOP taking these medications   collagenase ointment Commonly known as: SANTYL   loperamide 2 MG tablet Commonly known as: Imodium A-D     TAKE these medications   amLODipine 10 MG tablet Commonly known as: NORVASC Take 1 tablet (10 mg total) by mouth daily.   aspirin 81 MG EC tablet Take 1 tablet (81 mg total) by mouth daily.   atorvastatin 80 MG tablet Commonly known as: LIPITOR Take 1 tablet  (80 mg total) by mouth daily at 6 PM.   Biktarvy 50-200-25 MG Tabs tablet Generic drug: bictegravir-emtricitabine-tenofovir AF TAKE 1 TABLET BY MOUTH DAILY.   blood glucose meter kit and supplies Dispense based on patient and insurance preference. Use up to four times daily as directed. (FOR ICD-10 E10.9, E11.9). What changed:   how much to take  how to take this  when to take this   carvedilol 25 MG tablet Commonly known as: COREG Take 1 tablet (25 mg total) by mouth 2 (two) times daily with a meal. Please make appt for future refills. [phone]. 1st attempt.   Cool Blood Glucose Test Strips test strip Generic drug: glucose blood Use as instructed   dapsone 100 MG tablet Take 1 tablet (100 mg total) by mouth daily.   Easy Comfort Pen Needles 31G X 5 MM Misc Generic drug:  Insulin Pen Needle INJECT 20 UNITS DAILY AS DIRECTED.   gabapentin 100 MG capsule Commonly known as: NEURONTIN Take 1 capsule (100 mg total) by mouth 3 (three) times daily.   icosapent Ethyl 1 g capsule Commonly known as: Vascepa Take 2 capsules (2 g total) by mouth 2 (two) times daily.   Insulin Syringes (Disposable) U-100 0.3 ML Misc 1 Syringe by Does not apply route 3 (three) times daily.   Levemir 100 UNIT/ML injection Generic drug: insulin detemir Inject 0.55 mLs (55 Units total) into the skin daily. What changed: how much to take   nitroGLYCERIN 0.4 MG SL tablet Commonly known as: NITROSTAT Place 1 tablet (0.4 mg total) under the tongue every 5 (five) minutes x 3 doses as needed for chest pain.   NovoLOG FlexPen 100 UNIT/ML FlexPen Generic drug: insulin aspart Inject 10 units under skin 3 times a day, with meals. What changed: additional instructions   onetouch ultrasoft lancets Use as instructed   oxybutynin 5 MG tablet Commonly known as: DITROPAN Take 1 tablet (5 mg total) by mouth 2 (two) times daily.   Oxycodone HCl 10 MG Tabs Take 1 tablet (10 mg total) by mouth daily as  needed for up to 5 doses for severe pain or breakthrough pain (pain).   silver sulfADIAZINE 1 % cream Commonly known as: Silvadene Apply 1 application topically daily. Apply to wound daily   ticagrelor 90 MG Tabs tablet Commonly known as: BRILINTA Take 1 tablet (90 mg total) by mouth 2 (two) times daily. What changed: when to take this      Allergies  Allergen Reactions  . Sulfur Itching    Patient stated he's allergic to "sulfur" AND "sulfa"  . Sulfa Antibiotics Itching    The results of significant diagnostics from this hospitalization (including imaging, microbiology, ancillary and laboratory) are listed below for reference.    Significant Diagnostic Studies: CARDIAC CATHETERIZATION  Result Date: 03/22/2020  Mid Cx lesion is 60% stenosed.  1st Mrg-1 lesion is 95% stenosed.  Previously placed 1st Mrg-2 stent (unknown type) is widely patent.  Dist LAD-1 lesion is 80% stenosed.  Dist LAD-2 lesion is 90% stenosed.  Dist LAD-3 lesion is 90% stenosed.  Dist RCA lesion is 50% stenosed.  The left ventricular systolic function is normal.  LV end diastolic pressure is normal.  The left ventricular ejection fraction is 50-55% by visual estimate.  Keith [name] is a 54 y.o. male  [id number] LOCATION:  FACILITY: [place] PHYSICIAN: Keith [name], M.D. 1965/07/14 DATE OF PROCEDURE:  03/22/2020 DATE OF DISCHARGE: CARDIAC CATHETERIZATION / PCI DES OM1 History obtained from chart review.Keith [name] is a 54 y.o. male with a history of uncontrolled type 2 DM, CAD (with stenting of OM1 11/2018), HIV, right BKA, CKD, chronic back pain, diabetic neuropathy, onychomycosis, hyperlipidemia, and hypertension, who presented to the hospital with multiple symptoms after running out of his insulin 1 week ago. He has had nausea, vomiting, blurry vision, abdominal pain, diarrhea and polyuria. He has been consuming large amounts of fluids. For some reason he also drank "[facility]" since his friend advised  him to take it. He himself is a poor historian.  He had a cardiac catheterization procedure in August 2020 when a stent was placed in the OM1 branch. He was also documented to have severe diffuse disease (90%) in the distal LAD. He does not recall taking his ASA recently. He also gives vague answers regarding his other medications.  During the current hospitalization, in the  ED, the vital signs were: 141/117 mmHg, HR 90 bpm. The labs were:  NA 122, K 5.5, Gluc >700, BUN 36, Cr 2.1, Hgb 15, and Hct 44.  The ECG showed acute ST elevations in the inferior and anterolateral leads PROCEDURE DESCRIPTION: The patient was brought to the second floor [place] [facility]iac cath lab in the postabsorptive state. He was not Premedicated. Marland Kitchen His right groin was prepped and shaved in usual sterile fashion. Xylocaine 1% was used for local anesthesia. A 6 French sheath was inserted into the right common femoral  artery using standard Seldinger technique.  Ultrasound was used to identify the vessel and guide access.  Digital image was captured and placed the patient's chart.  A 5 French left Judkins diagnostic catheter, AL-1 diagnostic catheter and pigtail catheters were used for selective coronary angiography and left ventriculography respectively.  The RCA was anterior takeoff.  Isovue dye was used for the entirety of the case.  Retrograde aorta, ventricular and pullback pressures were recorded. The infarct-related artery was the first obtuse marginal branch which had a 99% ostial stenosis.  This is a previously stented vessel by Dr. Ellyn Hack 1 year ago.  There was TIMI II flow.  Patient received 10,000 units of heparin with an ACT of approximate 275.  Isovue dye was used for the entirety of the case.  The patient received Brilinta 180 mg p.o. Using a 6 French XB 3.5 mm guide catheter with a 0.14 [place]ater guidewire the first obtuse marginal branch was wired with some difficulty.  The origin of the LM branch was then predilated with a  2 mm x 12 mm balloon and stented with a 2.25 mm x 12 mm long Medtronic Onyx resolute drug-eluting stent carefully positioned at the ostium deployed at 14 atm (2.3 mm) resulting reduction of a 95% ostial OM1 stenosis to 0% residual.  Patient tolerated procedure well.  The guidewire and catheter were removed and the sheath was secured in place.   Successful OM1 ostial PCI drug-eluting stenting in the setting of inferolateral STEMI.  The patient was asymptomatic upon presentation and throughout the case.  There was TIMI-2 flow initially and TIMI-3 flow at the end of the case.  This was the only lesion that could potentially be implicated with the EKG changes.  The RCA was widely patent.  It did have an anterior takeoff.  The LAD had diffuse apical disease.  The EF appeared normal with hand-injection.  The patient will need dual antiplatelet therapy uninterrupted for at least 12 months.  2D echo will be obtained.  Aggressive risk factor modification will be recommended.  The patient did have severe hyper glycemia which will need to be addressed during his hospitalization. Keith [name]. MD, [facility] 03/22/2020 1:15 AM   US RENAL  Result Date: 03/23/2020 CLINICAL DATA:  Acute kidney injury EXAM: RENAL / URINARY TRACT ULTRASOUND COMPLETE COMPARISON:  Ultrasound 10/21/2018 FINDINGS: Right Kidney: Renal measurements: 12.8 x 6 x 5.2 cm = volume: 207.3 mL. Cortex is slightly echogenic. No mass or hydronephrosis. Left Kidney: Renal measurements: 12.2 x 5.8 x 5.2 cm = volume: 192.1 mL. Cortex appears slightly echogenic. No mass or hydronephrosis. Bladder: Appears normal for degree of bladder distention. Other: None. IMPRESSION: 1. Slightly echogenic kidneys suggestive of medical renal disease. 2. No hydronephrosis Electronically Signed   By: Donavan Foil M.D.   On: 03/23/2020 18:03   DG Chest Portable 1 View  Result Date: 03/21/2020 CLINICAL DATA:  Hyperglycemia. STEMI on EKG. EXAM: PORTABLE CHEST 1  VIEW COMPARISON:   01/05/2020 FINDINGS: Stable mild cardiomegaly. Unchanged mediastinal contours with aortic tortuosity. Minor bibasilar atelectasis. No pulmonary edema. No confluent consolidation. No pleural fluid or pneumothorax. No acute osseous abnormalities are seen. IMPRESSION: Stable mild cardiomegaly. Minor bibasilar atelectasis. Electronically Signed   By: Keith Rake M.D.   On: 03/21/2020 23:05   ECHOCARDIOGRAM COMPLETE  Result Date: 03/22/2020    ECHOCARDIOGRAM REPORT   Patient Name:   Keith [name] Clipper Date of Exam: 03/22/2020 Medical Rec #:  [id number]      Height:       69.0 in Accession #:    [id number]     Weight:       260.6 lb Date of Birth:  May 06, 1965      BSA:          2.312 m Patient Age:    57 years       BP:           106/79 mmHg Patient Gender: M              HR:           77 bpm. Exam Location:  Inpatient Procedure: 2D Echo, Cardiac Doppler, Color Doppler and Intracardiac            Opacification Agent Indications:    Acute MI  History:        Patient has prior history of Echocardiogram examinations, most                 recent 11/11/2018. CAD; Risk Factors:Diabetes, Hypertension and                 Dyslipidemia. CKD, HIV.  Sonographer:    Dustin Flock Referring Phys: [id number] [facility] Rosendo Gros  Sonographer Comments: Technically difficult study due to poor echo windows and patient is morbidly obese. Image acquisition challenging due to uncooperative patient. IMPRESSIONS  1. Technically difficult study with poor acoustical windows and patient cooperation even with definity contrast. Left ventricular ejection fraction, by estimation, is 55 to 60%. The left ventricle has normal function. Left ventricular endocardial border  not optimally defined to evaluate regional wall motion but grossly appear normal. There is mild concentric left ventricular hypertrophy. Left ventricular diastolic parameters are consistent with Grade I diastolic dysfunction (impaired relaxation).  2. Right ventricular systolic  function is normal. The right ventricular size is normal. Tricuspid regurgitation signal is inadequate for assessing PA pressure.  3. The mitral valve is normal in structure. No evidence of mitral valve regurgitation. No evidence of mitral stenosis.  4. The aortic valve is normal in structure. Aortic valve regurgitation is not visualized. No aortic stenosis is present.  5. Aortic dilatation noted. There is mild dilatation of the ascending aorta and of the aortic root, measuring 38 mm.  6. The inferior vena cava is normal in size with greater than 50% respiratory variability, suggesting right atrial pressure of 3 mmHg. FINDINGS  Left Ventricle: Left ventricular ejection fraction, by estimation, is 55 to 60%. The left ventricle has normal function. Left ventricular endocardial border not optimally defined to evaluate regional wall motion. Definity contrast agent was given IV to delineate the left ventricular endocardial borders. The left ventricular internal cavity size was normal in size. There is mild concentric left ventricular hypertrophy. Left ventricular diastolic parameters are consistent with Grade I diastolic dysfunction (impaired relaxation). Normal left ventricular filling pressure. Right Ventricle: The right ventricular size is normal. No increase in right ventricular wall thickness. Right ventricular systolic  function is normal. Tricuspid regurgitation signal is inadequate for assessing PA pressure. Left Atrium: Left atrial size was normal in size. Right Atrium: Right atrial size was normal in size. Pericardium: There is no evidence of pericardial effusion. Mitral Valve: The mitral valve is normal in structure. No evidence of mitral valve regurgitation. No evidence of mitral valve stenosis. Tricuspid Valve: The tricuspid valve is normal in structure. Tricuspid valve regurgitation is not demonstrated. No evidence of tricuspid stenosis. Aortic Valve: The aortic valve is normal in structure. Aortic valve  regurgitation is not visualized. No aortic stenosis is present. Pulmonic Valve: The pulmonic valve was normal in structure. Pulmonic valve regurgitation is not visualized. No evidence of pulmonic stenosis. Aorta: Aortic dilatation noted. There is mild dilatation of the ascending aorta and of the aortic root, measuring 38 mm. Venous: The inferior vena cava is normal in size with greater than 50% respiratory variability, suggesting right atrial pressure of 3 mmHg. IAS/Shunts: No atrial level shunt detected by color flow Doppler.  LEFT VENTRICLE PLAX 2D LVIDd:         5.40 cm  Diastology LVIDs:         3.40 cm  LV e' medial:    6.09 cm/s LV PW:         1.40 cm  LV E/e' medial:  7.2 LV IVS:        1.40 cm  LV e' lateral:   7.62 cm/s LVOT diam:     2.10 cm  LV E/e' lateral: 5.8 LV SV:         44 LV SV Index:   19 LVOT Area:     3.46 cm  RIGHT VENTRICLE RV Basal diam:  3.30 cm RV S prime:     7.40 cm/s TAPSE (M-mode): 2.0 cm LEFT ATRIUM             Index       RIGHT ATRIUM           Index LA diam:        2.60 cm 1.12 cm/m  RA Area:     12.20 cm LA Vol (A2C):   56.5 ml 24.44 ml/m RA Volume:   28.40 ml  12.28 ml/m LA Vol (A4C):   47.7 ml 20.63 ml/m LA Biplane Vol: 53.2 ml 23.01 ml/m  AORTIC VALVE LVOT Vmax:   80.90 cm/s LVOT Vmean:  46.400 cm/s LVOT VTI:    0.127 m  AORTA Ao Root diam: 3.80 cm MITRAL VALVE MV Area (PHT): 2.87 cm    SHUNTS MV Decel Time: 264 msec    Systemic VTI:  0.13 m MV E velocity: 44.10 cm/s  Systemic Diam: 2.10 cm MV A velocity: 59.60 cm/s MV E/A ratio:  0.74 Fransico Him MD Electronically signed by Fransico Him MD Signature Date/Time: 03/22/2020/11:59:06 AM    Final     Microbiology: Recent Results (from the past 240 hour(s))  Resp Panel by RT-PCR (Flu A&B, Covid) Nasopharyngeal Swab     Status: None   Collection Time: 03/21/20 10:41 PM   Specimen: Nasopharyngeal Swab; Nasopharyngeal(NP) swabs in vial transport medium  Result Value Ref Range Status   SARS Coronavirus 2 by RT PCR  NEGATIVE NEGATIVE Final    Comment: (NOTE) SARS-CoV-2 target nucleic acids are NOT DETECTED.  The SARS-CoV-2 RNA is generally detectable in upper respiratory specimens during the acute phase of infection. The lowest concentration of SARS-CoV-2 viral copies this assay can detect is 138 copies/mL. A negative result does not preclude SARS-Cov-2  infection and should not be used as the sole basis for treatment or other patient management decisions. A negative result may occur with  improper specimen collection/handling, submission of specimen other than nasopharyngeal swab, presence of viral mutation(s) within the areas targeted by this assay, and inadequate number of viral copies(<138 copies/mL). A negative result must be combined with clinical observations, patient history, and epidemiological information. The expected result is Negative.  Fact Sheet for Patients:  EntrepreneurPulse.com.au  Fact Sheet for Healthcare Providers:  IncredibleEmployment.be  This test is no t yet approved or cleared by the [place] FDA and  has been authorized for detection and/or diagnosis of SARS-CoV-2 by FDA under an Emergency Use Authorization (EUA). This EUA will remain  in effect (meaning this test can be used) for the duration of the COVID-19 declaration under Section 564(b)(1) of the Act, 21 U.S.C.section 360bbb-3(b)(1), unless the authorization is terminated  or revoked sooner.       Influenza A by PCR NEGATIVE NEGATIVE Final   Influenza B by PCR NEGATIVE NEGATIVE Final    Comment: (NOTE) The Xpert Xpress SARS-CoV-2/FLU/RSV plus assay is intended as an aid in the diagnosis of influenza from Nasopharyngeal swab specimens and should not be used as a sole basis for treatment. Nasal washings and aspirates are unacceptable for Xpert Xpress SARS-CoV-2/FLU/RSV testing.  Fact Sheet for Patients: EntrepreneurPulse.com.au  Fact Sheet for  Healthcare Providers: IncredibleEmployment.be  This test is not yet approved or cleared by the [place] FDA and has been authorized for detection and/or diagnosis of SARS-CoV-2 by FDA under an Emergency Use Authorization (EUA). This EUA will remain in effect (meaning this test can be used) for the duration of the COVID-19 declaration under Section 564(b)(1) of the Act, 21 U.S.C. section 360bbb-3(b)(1), unless the authorization is terminated or revoked.  Performed at [facility], [place] [address], [place], [place] [address]   MRSA PCR Screening     Status: Abnormal   Collection Time: 03/22/20  1:46 AM   Specimen: Nasal Mucosa; Nasopharyngeal  Result Value Ref Range Status   MRSA by PCR POSITIVE (A) NEGATIVE Final    Comment:        The GeneXpert MRSA Assay (FDA approved for NASAL specimens only), is one component of a comprehensive MRSA colonization surveillance program. It is not intended to diagnose MRSA infection nor to guide or monitor treatment for MRSA infections. RESULT CALLED TO, READ BACK BY AND VERIFIED WITH: H BOPE RN 03/22/20 0416 JDW Performed at [facility], [place] [address], [place], [place] [address]      Labs: Basic Metabolic Panel: Recent Labs  Lab 03/24/20 0211 03/25/20 0152 03/25/20 2200 03/26/20 0116 03/27/20 0327 03/28/20 0207  NA 131* 134*  --  132* 135 132*  K 3.9 4.0  --  4.2 4.8 4.8  CL 99 102  --  101 105 102  CO2 20* 21*  --  20* 20* 20*  GLUCOSE 313* 325* 400* 349* 250* 347*  BUN 44* 41*  --  38* 33* 33*  CREATININE 4.40* 4.29*  --  3.31* 2.98* 2.96*  CALCIUM 8.6* 8.7*  --  8.8* 9.0 8.9  PHOS 4.6 4.2  --  4.0  --   --    Liver Function Tests: Recent Labs  Lab 03/21/20 2232 03/24/20 0211 03/25/20 0152 03/26/20 0116  AST 25  --   --   --   ALT 17  --   --   --   ALKPHOS 97  --   --   --  BILITOT 0.6  --   --   --   PROT 8.9*  --   --   --   ALBUMIN 3.1* 2.4* 2.3* 2.5*   No results for  input(s): LIPASE, AMYLASE in the last 168 hours. No results for input(s): AMMONIA in the last 168 hours. CBC: Recent Labs  Lab 03/21/20 2109 03/22/20 0312 03/24/20 0211 03/27/20 0327  WBC  --  6.4 6.9 6.7  NEUTROABS  --   --   --  3.8  HGB 15.0 11.0* 10.6* 10.9*  HCT 44.0 31.7* 32.3* 34.1*  MCV  --  77.9* 80.5 83.0  PLT  --  251 239 282   Cardiac Enzymes: No results for input(s): CKTOTAL, CKMB, CKMBINDEX, TROPONINI in the last 168 hours. BNP: BNP (last 3 results) No results for input(s): BNP in the last 8760 hours.  ProBNP (last 3 results) No results for input(s): PROBNP in the last 8760 hours.  CBG: Recent Labs  Lab 03/27/20 1118 03/27/20 1608 03/27/20 2133 03/28/20 0752 03/28/20 1130  GLUCAP 170* 121* 226* 294* 298*    Principal Problem:   Acute ST elevation myocardial infarction (STEMI) due to occlusion of circumflex coronary artery (HCC) Active Problems:   Essential hypertension   HIV disease (HCC)   STEMI (ST elevation myocardial infarction) (HCC)   CKD (chronic kidney disease) stage 4, GFR 15-29 ml/min (HCC)   Uncontrolled type 2 diabetes mellitus with hyperglycemia ([place])   Time coordinating discharge: 38 minutes.  Signed:        Su Duma, DO [facility]  03/28/2020, 5:48 PM

## 2020-03-28 NOTE — Progress Notes (Addendum)
Progress Note  Patient Name: Keith [name] Date of Encounter: 03/28/2020  [facility] Cardiologist: Larae Grooms, MD   Subjective   Feeling okay this morning. No complaints of chest pain or SOB. No palpitations.   Inpatient Medications    Scheduled Meds: . amLODipine  10 mg Oral Daily  . aspirin  81 mg Oral Daily  . atorvastatin  80 mg Oral Daily  . bictegravir-emtricitabine-tenofovir AF  1 tablet Oral Daily  . carvedilol  25 mg Oral BID WC  . dapsone  100 mg Oral Daily  . gabapentin  100 mg Oral TID  . heparin injection (subcutaneous)  5,000 Units Subcutaneous Q8H  . insulin aspart  0-20 Units Subcutaneous TID WC  . insulin aspart  0-5 Units Subcutaneous QHS  . insulin aspart  3 Units Subcutaneous TID WC  . insulin glargine  50 Units Subcutaneous Daily  . sodium chloride flush  3 mL Intravenous Q12H  . ticagrelor  90 mg Oral BID   Continuous Infusions: . sodium chloride     PRN Meds: sodium chloride, acetaminophen, dextrose, nitroGLYCERIN, ondansetron (ZOFRAN) IV, oxyCODONE, sodium chloride flush   Vital Signs    Vitals:   03/27/20 1952 03/27/20 2320 03/28/20 0601 03/28/20 0754  BP: 126/86 109/70 102/66 132/84  Pulse:   68 62  Resp: 13 17 10 12   Temp: 97.6 F (36.4 C) 98.6 F (37 C) 97.7 F (36.5 C) (!) 97.3 F (36.3 C)  TempSrc: Oral Oral Oral Oral  SpO2: 99% 99% 99% 100%  Weight:   121.7 kg   Height:        Intake/Output Summary (Last 24 hours) at 03/28/2020 0814 Last data filed at 03/28/2020 0755 Gross per 24 hour  Intake 807 ml  Output 1950 ml  Net -1143 ml   Last 3 Weights 03/28/2020 03/27/2020 03/26/2020  Weight (lbs) 268 lb 4.8 oz 266 lb 11 oz 266 lb 6.4 oz  Weight (kg) 121.7 kg 120.97 kg 120.838 kg  Some encounter information is confidential and restricted. Go to Review Flowsheets activity to see all data.      Telemetry    Sinus rhythm with 1st degree AV block - Personally Reviewed  ECG    No new tracings - Personally  Reviewed  Physical Exam   GEN: No acute distress.   Neck: No JVD Cardiac: RRR, no murmurs, rubs, or gallops.  Respiratory: Clear to auscultation bilaterally. GI: Soft, nontender, non-distended  MS: No edema; s/p R BKA Neuro:  Nonfocal  Psych: Normal affect   Labs    High Sensitivity Troponin:   Recent Labs  Lab 03/21/20 2232 03/22/20 0312  TROPONINIHS 1,065* 1,427*      Chemistry Recent Labs  Lab 03/21/20 2232 03/22/20 0312 03/24/20 0211 03/25/20 0152 03/25/20 2200 03/26/20 0116 03/27/20 0327 03/28/20 0207  NA 120*   < > 131* 134*  --  132* 135 132*  K 4.8   < > 3.9 4.0  --  4.2 4.8 4.8  CL 87*   < > 99 102  --  101 105 102  CO2 22   < > 20* 21*  --  20* 20* 20*  GLUCOSE 761*   < > 313* 325*   < > 349* 250* 347*  BUN 30*   < > 44* 41*  --  38* 33* 33*  CREATININE 2.33*   < > 4.40* 4.29*  --  3.31* 2.98* 2.96*  CALCIUM 9.5   < > 8.6* 8.7*  --  8.8* 9.0 8.9  PROT 8.9*  --   --   --   --   --   --   --   ALBUMIN 3.1*  --  2.4* 2.3*  --  2.5*  --   --   AST 25  --   --   --   --   --   --   --   ALT 17  --   --   --   --   --   --   --   ALKPHOS 97  --   --   --   --   --   --   --   BILITOT 0.6  --   --   --   --   --   --   --   GFRNONAA 32*   < > 15* 15*  --  21* 24* 24*  ANIONGAP 11   < > 12 11  --  11 10 10    < > = values in this interval not displayed.     Hematology Recent Labs  Lab 03/22/20 0312 03/24/20 0211 03/27/20 0327  WBC 6.4 6.9 6.7  RBC 4.07* 4.01* 4.11*  HGB 11.0* 10.6* 10.9*  HCT 31.7* 32.3* 34.1*  MCV 77.9* 80.5 83.0  MCH 27.0 26.4 26.5  MCHC 34.7 32.8 32.0  RDW 11.9 12.1 12.4  PLT 251 239 282    BNPNo results for input(s): BNP, PROBNP in the last 168 hours.   DDimer No results for input(s): DDIMER in the last 168 hours.   Radiology    No results found.  Cardiac Studies   Left heart catheterization 03/22/20:  Mid Cx lesion is 60% stenosed.  1st Mrg-1 lesion is 95% stenosed.  Previously placed 1st Mrg-2 stent (unknown  type) is widely patent.  Dist LAD-1 lesion is 80% stenosed.  Dist LAD-2 lesion is 90% stenosed.  Dist LAD-3 lesion is 90% stenosed.  Dist RCA lesion is 50% stenosed.  The left ventricular systolic function is normal.  LV end diastolic pressure is normal.  The left ventricular ejection fraction is 50-55% by visual estimate.  IMPRESSION:Successful OM1 ostial PCI drug-eluting stenting in the setting of inferolateral STEMI. The patient was asymptomatic upon presentation and throughout the case. There was TIMI-2 flow initially and TIMI-3 flow at the end of the case. This was the only lesion that could potentially be implicated with the EKG changes. The RCA was widely patent. It did have an anterior takeoff. The LAD had diffuse apical disease. The EF appeared normal with hand-injection. The patient will need dual antiplatelet therapy uninterrupted for at least 12 months. 2D echo will be obtained. Aggressive risk factor modification will be recommended. The patient did have severe hyper glycemia which will need to be addressed during his hospitalization.  Diagnostic Dominance: Right     Patient Profile   Keith [name] is a 54 y.o. 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   Assessment & Plan    1. STEMI in patient with known CAD: patient presented with chest pain, found to have inferolateral STEMI on EKG. He underwent emergent cardiac cath which showed 95% stenosis OM1 proximal to previously placed OM1 stent which was patent - managed with PCI/DES. Also with severe distal LAD stenosis and 50% dRCA stenosis which was  medically managed. He was started on aspirin and brilinta for DAPT.  - Continue aspirin and brilinta - Continue atorvastatin - Continue carvedilol - Continue prn SL nitro  2. HTN: BP frequently above goal. He was  started on amlodipine and uptitrated to 10mg   - Continue carvedilol and amlodipine  3. HLD: LDL 199 this admission, though these were not fasting labs. - Continue atorvastatin - Would repeat FLP in 1 month with low threshold to refer to the lipid clinic if LDL >70  4. DM type 2: poorly controlled with A1C  >15.5 this admission. Patient reported running out of insulin 1 week prior to admission. Medicine and DM coordinator were consulted to assist with management. Unable to add SGLT2-inhibitor due to CKD. - Continue management per primary team  5. AoCKD stage 3: patients post cath course was complicated by contrast nephropathy with Cr peak of 4.4 from baseline 2.3. Nephrology consulted with plans to follow-up closely outpatient with Dr. Posey Pronto - Continue close monitoring and outpatient follow-up with Nephrology.  6. HIV:  - Continue dapsone and kitarvy   [facility] will sign off.   Medication Recommendations:  As above Other recommendations (labs, testing, etc):  Repeat FLP outpatient Follow up as an outpatient:  Scheduled to see Melina Copa 04/14/20 - AVS updated  For questions or updates, please contact [place] Please consult [email or web address] for contact info under        Signed, Abigail Butts, PA-C  03/28/2020, 8:14 AM    Agree with assessment and plan by Roby Lofts PA-C  Patient denies chest pain.  EKG does show residual subtle inferolateral ST segment elevation.  He is on dual antiplatelet therapy including aspirin and Brilinta.  Major issue will be medication compliance and diabetes control.  Follow-up has been arranged with Melina Copa and Dr. Irish Lack.   [name], M.D., [place], [facility], [place] [address] [place], [place]  [address]  [phone] 03/28/2020 9:41 AM

## 2020-03-30 ENCOUNTER — Ambulatory Visit: Payer: Medicare HMO | Admitting: Infectious Diseases

## 2020-04-04 DIAGNOSIS — M129 Arthropathy, unspecified: Secondary | ICD-10-CM | POA: Diagnosis not present

## 2020-04-04 DIAGNOSIS — G894 Chronic pain syndrome: Secondary | ICD-10-CM | POA: Diagnosis not present

## 2020-04-04 DIAGNOSIS — Z79899 Other long term (current) drug therapy: Secondary | ICD-10-CM | POA: Diagnosis not present

## 2020-04-04 DIAGNOSIS — G546 Phantom limb syndrome with pain: Secondary | ICD-10-CM | POA: Diagnosis not present

## 2020-04-05 ENCOUNTER — Telehealth: Payer: Self-pay | Admitting: Family Medicine

## 2020-04-05 NOTE — Telephone Encounter (Signed)
Attempt to call patient regarding his concern. No answer and LVM.

## 2020-04-07 ENCOUNTER — Ambulatory Visit (INDEPENDENT_AMBULATORY_CARE_PROVIDER_SITE_OTHER): Payer: Medicare HMO | Admitting: Family Medicine

## 2020-04-07 ENCOUNTER — Other Ambulatory Visit: Payer: Self-pay

## 2020-04-07 VITALS — BP 142/86 | HR 74 | Temp 98.7°F | Resp 20 | Ht 69.0 in | Wt 243.0 lb

## 2020-04-07 DIAGNOSIS — I259 Chronic ischemic heart disease, unspecified: Secondary | ICD-10-CM

## 2020-04-07 DIAGNOSIS — R7309 Other abnormal glucose: Secondary | ICD-10-CM | POA: Diagnosis not present

## 2020-04-07 DIAGNOSIS — E1165 Type 2 diabetes mellitus with hyperglycemia: Secondary | ICD-10-CM

## 2020-04-07 DIAGNOSIS — R69 Illness, unspecified: Secondary | ICD-10-CM | POA: Diagnosis not present

## 2020-04-07 DIAGNOSIS — E1169 Type 2 diabetes mellitus with other specified complication: Secondary | ICD-10-CM

## 2020-04-07 DIAGNOSIS — Z89511 Acquired absence of right leg below knee: Secondary | ICD-10-CM | POA: Diagnosis not present

## 2020-04-07 DIAGNOSIS — R739 Hyperglycemia, unspecified: Secondary | ICD-10-CM | POA: Diagnosis not present

## 2020-04-07 DIAGNOSIS — Z09 Encounter for follow-up examination after completed treatment for conditions other than malignant neoplasm: Secondary | ICD-10-CM

## 2020-04-07 DIAGNOSIS — Z794 Long term (current) use of insulin: Secondary | ICD-10-CM

## 2020-04-07 DIAGNOSIS — F419 Anxiety disorder, unspecified: Secondary | ICD-10-CM

## 2020-04-07 DIAGNOSIS — B20 Human immunodeficiency virus [HIV] disease: Secondary | ICD-10-CM

## 2020-04-07 DIAGNOSIS — I2121 ST elevation (STEMI) myocardial infarction involving left circumflex coronary artery: Secondary | ICD-10-CM

## 2020-04-07 LAB — GLUCOSE, POCT (MANUAL RESULT ENTRY): POC Glucose: 334 mg/dl — AB (ref 70–99)

## 2020-04-07 MED ORDER — BLOOD GLUCOSE METER KIT
PACK | 0 refills | Status: DC
Start: 2020-04-07 — End: 2020-07-10

## 2020-04-07 NOTE — Progress Notes (Signed)
Patient [facility] Follow Up  Subjective:  [name], male    DOB: July 07, 1965  Age: 54 y.o. MRN: [id number]  CC: No chief complaint on file.   HPI Keith [name] is a 54 year old male who presents for Follow Up today.   Patient Active Problem List   Diagnosis Date Noted  . Acute ST elevation myocardial infarction (STEMI) due to occlusion of circumflex coronary artery ([place]) 03/22/2020  . Uncontrolled type 2 diabetes mellitus with hyperglycemia ([place]) 01/14/2019  . Elevated glucose 01/14/2019  . Hemoglobin A1C between 7% and 9% indicating borderline diabetic control 01/14/2019  . Hx of BKA, right ([place]) 01/14/2019  . Pressure injury of skin 11/11/2018  . STEMI (ST elevation myocardial infarction) ([place]) 11/10/2018  . CKD (chronic kidney disease) stage 4, GFR 15-29 ml/min (HCC) 11/10/2018  . Amputation stump infection ([place]) 10/28/2018  . Type II diabetes mellitus with renal manifestations ([place]) 08/07/2018  . Uncontrolled type 2 diabetes mellitus with hyperosmolar nonketotic hyperglycemia ([place]) 08/07/2018  . Non-pressure chronic ulcer of right calf limited to breakdown of skin ([place]) 07/06/2018  . Type 2 diabetes mellitus without complication, without long-term current use of insulin ([place]) 06/15/2018  . Chronic low back pain without sciatica 06/15/2018  . Idiopathic chronic venous hypertension of left lower extremity with ulcer and inflammation ([place])   . Venous stasis ulcer of left calf limited to breakdown of skin without varicose veins ([place]) 04/23/2018  . Acquired absence of right leg below knee ([place]) 06/13/2017  . Acute renal failure superimposed on stage 3 chronic kidney disease ([place]) 03/15/2017  . Diabetic polyneuropathy associated with type 2 diabetes mellitus ([place]) 01/23/2017  . AKI (acute kidney injury) ([place]) 09/28/2016  . Nausea vomiting and diarrhea 09/28/2016  . Onychomycosis of multiple toenails with type 2  diabetes mellitus ([place]) 08/29/2015  . MRSA carrier 04/20/2014  . Penile wart 02/22/2014  . HIV disease ([place])   . Insulin-requiring or dependent type II diabetes mellitus ([place]) 02/04/2014  . Dental anomaly 11/20/2012  . Arthritis of right knee 02/23/2012  . Hyperlipidemia 11/10/2011  . Chronic pain 08/07/2011  . Meralgia paraesthetica 04/23/2011  . ERECTILE DYSFUNCTION 08/22/2008  . Essential hypertension 05/19/2008   Current Status: Since his last office visit, he is doing well with no complaints. He denies visual changes, chest pain, cough, shortness of breath, heart palpitations, and falls. He has occasional headaches and dizziness with position changes. Denies severe headaches, confusion, seizures, double vision, and blurred vision, nausea and vomiting. He has not been monitoring his blood glucose levels lately. He denies fatigue, frequent urination, blurred vision, excessive hunger, excessive thirst, weight gain, weight loss, and poor wound healing. He continues to check his left foot regularly. He denies fevers, chills, recent infections, weight loss, and night sweats. Denies GI problems such as diarrhea, and constipation. He has no reports of blood in stools, dysuria and hematuria. No depression or anxiety reported today. He is taking all medications as prescribed. He denies pain today.   Past Medical History:  Diagnosis Date  . Abscess of right foot    abscess/ulcer of R transtibial amputation requiring IV abx  . AIDS ([place])   . Anemia   . CAD (coronary artery disease)    a. MI with stenting of OM1 in 11/2018 with residual disease. b. acute STEMI 03/2020 s/p DES to OM1  . Chronic anemia   . Chronic knee pain    right  .  Chronic pain   . CKD (chronic kidney disease), stage IV ([place])   . Diabetes type 2, uncontrolled ([place])    HgA1c 17.6 (04/27/2010)  . Diabetic foot ulcer ([place]) 01/2017   right foot  . Dilatation of aorta (HCC)   . Erectile dysfunction   . Genital warts   . HIV  (human immunodeficiency virus infection) ([place]) 2009   CD4 count 100, VL 13800 (05/01/2010)  . Hyperlipidemia   . Hypertension   . Noncompliance with medication regimen   . Osteomyelitis ([place])    h/o hand  . Osteomyelitis of metatarsal ([place]) 04/28/2017    Past Surgical History:  Procedure Laterality Date  . AMPUTATION Right 05/02/2017   Procedure: AMPUTATION TRANSMETARSAL;  Surgeon: [name], MD;  Location: [facility];  Service: Orthopedics;  Laterality: Right;  . AMPUTATION Right 06/13/2017   Procedure: RIGHT BELOW KNEE AMPUTATION;  Surgeon: [name], MD;  Location: [facility];  Service: Orthopedics;  Laterality: Right;  . BELOW KNEE LEG AMPUTATION Right 06/13/2017  . BELOW KNEE LEG AMPUTATION    . CORONARY STENT INTERVENTION N/A 11/10/2018   Procedure: CORONARY STENT INTERVENTION;  Surgeon: Leonie Man, MD;  Location: [facility];  Service: Cardiovascular;  Laterality: N/A;  . CORONARY/GRAFT ACUTE MI REVASCULARIZATION N/A 03/21/2020   Procedure: Coronary/Graft Acute MI Revascularization;  Surgeon: [name], MD;  Location: [facility];  Service: Cardiovascular;  Laterality: N/A;  . HERNIA REPAIR    . I & D EXTREMITY Left 08/21/2014   Procedure: INCISION AND DRAINAGE LEFT SMALL FINGER;  Surgeon: Leanora Cover, MD;  Location: [facility];  Service: Orthopedics;  Laterality: Left;  . I & D EXTREMITY Right 03/18/2017   Procedure: IRRIGATION AND DEBRIDEMENT EXTREMITY;  Surgeon: [name], MD;  Location: [facility];  Service: Orthopedics;  Laterality: Right;  . LEFT HEART CATH AND CORONARY ANGIOGRAPHY N/A 11/10/2018   Procedure: LEFT HEART CATH AND CORONARY ANGIOGRAPHY;  Surgeon: Leonie Man, MD;  Location: [facility];  Service: Cardiovascular;  Laterality: N/A;  . LEFT HEART CATH AND CORONARY ANGIOGRAPHY N/A 03/21/2020   Procedure: LEFT HEART CATH AND CORONARY ANGIOGRAPHY;  Surgeon: [name], MD;  Location: [facility];  Service: Cardiovascular;   Laterality: N/A;  . MINOR IRRIGATION AND DEBRIDEMENT OF WOUND Right 04/22/2014   Procedure: IRRIGATION AND DEBRIDEMENT OF RIGHT NECK ABCESS;  Surgeon: Jerrell Belfast, MD;  Location: [facility];  Service: ENT;  Laterality: Right;  . MULTIPLE EXTRACTIONS WITH ALVEOLOPLASTY N/A 01/18/2013   Procedure: MULTIPLE EXTRACION 3, 6, 7, 10, 11, 13, 21, 22, 27, 28, 29, 30 WITH ALVEOLOPLASTY;  Surgeon: Gae Bon, DDS;  Location: [facility];  Service: Oral Surgery;  Laterality: N/A;  . SKIN SPLIT GRAFT Right 03/21/2017   Procedure: IRRIGATION AND DEBRIDEMENT RIGHT FOOT AND APPLY SPLIT THICKNESS SKIN GRAFT AND WOUND VAC;  Surgeon: [name], MD;  Location: [facility];  Service: Orthopedics;  Laterality: Right;  . TEE WITHOUT CARDIOVERSION N/A 05/02/2017   Procedure: TRANSESOPHAGEAL ECHOCARDIOGRAM (TEE);  Surgeon: Acie Fredrickson Wonda Cheng, MD;  Location: [facility];  Service: Cardiovascular;  Laterality: N/A;  coincidental to orthopedic case    Family History  Problem Relation Age of Onset  . Hypertension Mother   . Arthritis Father   . Hypertension Father   . Hypertension Brother   . Cancer Maternal Grandmother 3       unknown type of cancer  . Depression Paternal Grandmother  Social History   Socioeconomic History  . Marital status: Single    Spouse name: Not on file  . Number of children: 3  . Years of education: 73  . Highest education level: Not on file  Occupational History  . Occupation: disabled  Tobacco Use  . Smoking status: Never Smoker  . Smokeless tobacco: Never Used  Vaping Use  . Vaping Use: Never used  Substance and Sexual Activity  . Alcohol use: Not Currently  . Drug use: Not Currently    Comment: OTC ASA  . Sexual activity: Yes    Partners: Female    Comment: given condoms  Other Topics Concern  . Not on file  Social History Narrative   ** Merged History Encounter **       Worked for the city of [place] for 18 years.   Unemployed.    Applying for disability.   Medicaid  patient.   Social Determinants of Health   Financial Resource Strain: Not on file  Food Insecurity: Not on file  Transportation Needs: Not on file  Physical Activity: Not on file  Stress: Not on file  Social Connections: Not on file  Intimate Partner Violence: Not on file    Outpatient Medications Prior to Visit  Medication Sig Dispense Refill  . amLODipine (NORVASC) 10 MG tablet Take 1 tablet (10 mg total) by mouth daily. 30 tablet 6  . aspirin 81 MG EC tablet Take 1 tablet (81 mg total) by mouth daily. 30 tablet 12  . atorvastatin (LIPITOR) 80 MG tablet Take 1 tablet (80 mg total) by mouth daily at 6 PM. 90 tablet 3  . BIKTARVY 50-200-25 MG TABS tablet TAKE 1 TABLET BY MOUTH DAILY. (Patient taking differently: Take 1 tablet by mouth daily.) 30 tablet 0  . carvedilol (COREG) 25 MG tablet Take 1 tablet (25 mg total) by mouth 2 (two) times daily with a meal. Please make appt for future refills. [phone]. 1st attempt. 60 tablet 6  . dapsone 100 MG tablet Take 1 tablet (100 mg total) by mouth daily. 30 tablet 0  . EASY COMFORT PEN NEEDLES 31G X 5 MM MISC INJECT 20 UNITS DAILY AS DIRECTED. 100 each 5  . gabapentin (NEURONTIN) 100 MG capsule Take 1 capsule (100 mg total) by mouth 3 (three) times daily. 90 capsule 0  . glucose blood (COOL BLOOD GLUCOSE TEST STRIPS) test strip Use as instructed 100 each 0  . icosapent Ethyl (VASCEPA) 1 g capsule Take 2 capsules (2 g total) by mouth 2 (two) times daily. 120 capsule 3  . insulin aspart (NOVOLOG FLEXPEN) 100 UNIT/ML FlexPen Inject 10 units under skin 3 times a day, with meals. 15 mL 11  . Insulin Syringes, Disposable, U-100 0.3 ML MISC 1 Syringe by Does not apply route 3 (three) times daily. 100 each 11  . Lancets (ONETOUCH ULTRASOFT) lancets Use as instructed 100 each 12  . LEVEMIR 100 UNIT/ML injection Inject 0.55 mLs (55 Units total) into the skin daily. 30 mL 3  . nitroGLYCERIN (NITROSTAT) 0.4 MG SL tablet Place 1 tablet (0.4 mg total)  under the tongue every 5 (five) minutes x 3 doses as needed for chest pain. 14 tablet 12  . oxybutynin (DITROPAN) 5 MG tablet Take 1 tablet (5 mg total) by mouth 2 (two) times daily. 60 tablet 2  . oxyCODONE 10 MG TABS Take 1 tablet (10 mg total) by mouth daily as needed for up to 5 doses for severe pain or breakthrough pain (  pain). 5 tablet 0  . silver sulfADIAZINE (SILVADENE) 1 % cream Apply 1 application topically daily. Apply to wound daily (Patient not taking: Reported on 03/22/2020) 50 g 1  . ticagrelor (BRILINTA) 90 MG TABS tablet Take 1 tablet (90 mg total) by mouth 2 (two) times daily. 60 tablet 0  . blood glucose meter kit and supplies Dispense based on patient and insurance preference. Use up to four times daily as directed. (FOR ICD-10 E10.9, E11.9). (Patient taking differently: No sig reported) 1 each 0   No facility-administered medications prior to visit.    Allergies  Allergen Reactions  . Sulfur Itching    Patient stated he's allergic to "sulfur" AND "sulfa"  . Sulfa Antibiotics Itching    ROS Review of Systems  Constitutional: Negative.   HENT: Negative.   Respiratory: Negative.   Cardiovascular: Negative.   Gastrointestinal: Positive for abdominal distention (obese).  Genitourinary: Negative.   Musculoskeletal: Negative.        Right BKA  Skin: Negative.   Neurological: Positive for dizziness (occasional ) and headaches (occasional ).  Psychiatric/Behavioral: Negative.       Objective:    Physical Exam Vitals and nursing note reviewed.  Constitutional:      Appearance: Normal appearance.  HENT:     Head: Normocephalic and atraumatic.  Cardiovascular:     Rate and Rhythm: Normal rate.     Pulses: Normal pulses.     Heart sounds: Normal heart sounds.  Pulmonary:     Effort: Pulmonary effort is normal.     Breath sounds: Normal breath sounds.  Abdominal:     General: Bowel sounds are normal.     Palpations: Abdomen is soft.  Musculoskeletal:      Cervical back: Normal range of motion.     Comments: Right BKA  Skin:    General: Skin is warm and dry.  Neurological:     General: No focal deficit present.     Mental Status: He is alert and oriented to person, place, and time.     Motor: Weakness present.  Psychiatric:        Mood and Affect: Mood normal.        Behavior: Behavior normal.        Thought Content: Thought content normal.        Judgment: Judgment normal.     BP (!) 142/86   Pulse 74   Temp 98.7 F (37.1 C)   Resp 20   Ht $R'5\' 9"'Am$  (1.753 m)   Wt 243 lb (110.2 kg)   SpO2 100%   BMI 35.88 kg/m  Wt Readings from Last 3 Encounters:  04/07/20 243 lb (110.2 kg)  03/28/20 268 lb 4.8 oz (121.7 kg)  12/21/19 289 lb (131.1 kg)     Health Maintenance Due  Topic Date Due  . COVID-19 Vaccine (1) Never done  . COLONOSCOPY (Pts 45-1yrs Insurance coverage will need to be confirmed)  Never done  . OPHTHALMOLOGY EXAM  06/23/2012  . FOOT EXAM  02/23/2015  . URINE MICROALBUMIN  02/23/2015  . INFLUENZA VACCINE  11/07/2019    There are no preventive care reminders to display for this patient.  Lab Results  Component Value Date   TSH 1.633 11/10/2018   Lab Results  Component Value Date   WBC 6.7 03/27/2020   HGB 10.9 (L) 03/27/2020   HCT 34.1 (L) 03/27/2020   MCV 83.0 03/27/2020   PLT 282 03/27/2020   Lab Results  Component Value Date  NA 132 (L) 03/28/2020   K 4.8 03/28/2020   CO2 20 (L) 03/28/2020   GLUCOSE 347 (H) 03/28/2020   BUN 33 (H) 03/28/2020   CREATININE 2.96 (H) 03/28/2020   BILITOT 0.6 03/21/2020   ALKPHOS 97 03/21/2020   AST 25 03/21/2020   ALT 17 03/21/2020   PROT 8.9 (H) 03/21/2020   ALBUMIN 2.5 (L) 03/26/2020   CALCIUM 8.9 03/28/2020   ANIONGAP 10 03/28/2020   Lab Results  Component Value Date   CHOL 291 (H) 03/21/2020   Lab Results  Component Value Date   HDL 30 (L) 03/21/2020   Lab Results  Component Value Date   LDLCALC 199 (H) 03/21/2020   Lab Results  Component Value  Date   TRIG 310 (H) 03/21/2020   Lab Results  Component Value Date   CHOLHDL 9.7 03/21/2020   Lab Results  Component Value Date   HGBA1C >15.5 (H) 03/24/2020      Assessment & Plan:   1. Hospital discharge follow-up  2. ST elevation myocardial infarction involving left circumflex coronary artery (HCC) Stable. No signs or symptoms of recurrence noted or reported today.   3. Chest pain due to myocardial ischemia, unspecified ischemic chest pain type Stable.   4. Uncontrolled type 2 diabetes mellitus with hyperglycemia (HCC) - blood glucose meter kit and supplies; Dispense based on patient and insurance preference. Use up to four times daily as directed. (FOR ICD-10 E10.9, E11.9).  Dispense: 1 each; Refill: 0 - POCT glucose (manual entry)  5. Hemoglobin A1C greater than 9%, indicating poor diabetic control  6. Type 2 diabetes mellitus with other specified complication, with long-term current use of insulin (HCC) - blood glucose meter kit and supplies; Dispense based on patient and insurance preference. Use up to four times daily as directed. (FOR ICD-10 E10.9, E11.9).  Dispense: 1 each; Refill: 0  7. Hyperglycemia  8. Elevated glucose - blood glucose meter kit and supplies; Dispense based on patient and insurance preference. Use up to four times daily as directed. (FOR ICD-10 E10.9, E11.9).  Dispense: 1 each; Refill: 0  9. Hx of BKA, right ([place])  10. Anxiety  11. HIV disease ([place]) Continue to follow up with Infection Disease as needed.   12. Follow up He will follow up 06/2020.  Meds ordered this encounter  Medications  . blood glucose meter kit and supplies    Sig: Dispense based on patient and insurance preference. Use up to four times daily as directed. (FOR ICD-10 E10.9, E11.9).    Dispense:  1 each    Refill:  0    Order Specific Question:   Number of strips    Answer:   100    Order Specific Question:   Number of lancets    Answer:   100    Orders Placed  This Encounter  Procedures  . POCT glucose (manual entry)    Referral Orders  No referral(s) requested today    Kathe Becton, MSN, ANE, FNP-BC [facility] [address] [place], [place] [address] [phone] fax    Problem List Items Addressed This Visit      Cardiovascular and Mediastinum   STEMI (ST elevation myocardial infarction) ([place])     Endocrine   Hemoglobin A1C between 7% and 9% indicating borderline diabetic control   Uncontrolled type 2 diabetes mellitus with hyperglycemia ([place])   Relevant Medications   blood glucose meter kit and supplies  Other Relevant Orders   POCT glucose (manual entry) (Completed)     Other   Elevated glucose   Relevant Medications   blood glucose meter kit and supplies   HIV disease (HCC) (Chronic)   Hx of BKA, right ([place])    Other Visit Diagnoses    Hospital discharge follow-up    -  Primary   Chest pain due to myocardial ischemia, unspecified ischemic chest pain type       Type 2 diabetes mellitus with other specified complication, with long-term current use of insulin (HCC)       Relevant Medications   blood glucose meter kit and supplies   Hyperglycemia       Anxiety       Follow up          Meds ordered this encounter  Medications  . blood glucose meter kit and supplies    Sig: Dispense based on patient and insurance preference. Use up to four times daily as directed. (FOR ICD-10 E10.9, E11.9).    Dispense:  1 each    Refill:  0    Order Specific Question:   Number of strips    Answer:   100    Order Specific Question:   Number of lancets    Answer:   100    Follow-up: Return in about 3 months (around 07/06/2020).    [name], FNP

## 2020-04-08 DIAGNOSIS — I639 Cerebral infarction, unspecified: Secondary | ICD-10-CM

## 2020-04-08 HISTORY — DX: Cerebral infarction, unspecified: I63.9

## 2020-04-11 ENCOUNTER — Ambulatory Visit: Payer: Medicare HMO | Admitting: Infectious Diseases

## 2020-04-12 ENCOUNTER — Encounter: Payer: Self-pay | Admitting: Physician Assistant

## 2020-04-12 NOTE — Progress Notes (Deleted)
Cardiology Office Note    Date:  04/12/2020   ID:  Keith [name], DOB 12-Sep-1965, MRN [id number]  PCP:  [name], FNP  Cardiologist:  Larae Grooms, MD  Electrophysiologist:  None   Chief Complaint: f/u MI  History of Present Illness:   Keith [name] is a 55 y.o. male with history of historically severely uncontrolled DM, HTN, HIV, CKD stage IV, CAD (MI with stenting of OM1 in 11/2018 with residual disease, acute STEMI 03/2020 s/p DES to OM1), chronic normocytic anemia, chronic pain, osteomyelitis with prior abscess s/p R BKA, dilation of ascending aorta/aortic root who presents for post-hospital follow-up. He was recently admitted with multiple symptoms including polydipsia, nausea, vomiting, blurry vision, abdominal pain, diarrhea, and polyuria. He had run out of his insulin 1 week prior and had been drinking [facility]. It was unclear if he was taking his other medicines and he was a poor historian. His labs revealed severe hyponatremia, hyperkalemia, glucose >700. EKG also showed acute inferolateral STEMI. Taken to cath lab acutely with findings below with successful DES to the ostial OM1 (stent reported to be patent but stenosis prior to stent per diagram). Residual disease present, recommended to treat medically. 2D Echo 03/22/20 showed EF 55-60%, grade 1 DD, mild LVH, mild dilation of ascending aorta and aortic root - technically difficult, patient uncooperative. Hospital course also notable for AKI with peak Cr 4.4 from baseline 2.3 (as of 08/2019), coming down to 2.96 prior to discharge. Cardiac-wise, discharged on ASA, Brilinta, atorvastatin, carvedilol, amlodipine.  Refills needed 30 with zero Labs possibly to reassess lipids with low [place] to refer - also needs renal/anemia  Refer to nephrology Anemia? - waxing waning  CAD s/p recent MI/PCI Essential HTN Hyperlipidemia CKD IV Mild dilation of ascending aorta/aortic root   Labwork independently  reviewed: 03/2020 K 4.8, Cr 2.94, Na 132 in context of 347, Hgb 10.9, LDL 199, trig 310, TChol 291, HDL 30, A1C >15.5   Past Medical History:  Diagnosis Date  . Abscess of right foot    abscess/ulcer of R transtibial amputation requiring IV abx  . AIDS ([place])   . Anemia   . CAD (coronary artery disease)    a. MI with stenting of OM1 in 11/2018 with residual disease. b. acute STEMI 03/2020 s/p DES to OM1  . Chronic anemia   . Chronic knee pain    right  . Chronic pain   . CKD (chronic kidney disease), stage IV ([place])   . Diabetes type 2, uncontrolled ([place])    HgA1c 17.6 (04/27/2010)  . Diabetic foot ulcer ([place]) 01/2017   right foot  . Dilatation of aorta ([facility])   . Erectile dysfunction   . Genital warts   . HIV (human immunodeficiency virus infection) ([place]) 2009   CD4 count 100, VL 13800 (05/01/2010)  . Hyperlipidemia   . Hypertension   . Noncompliance with medication regimen   . Osteomyelitis ([place])    h/o hand  . Osteomyelitis of metatarsal ([place]) 04/28/2017    Past Surgical History:  Procedure Laterality Date  . AMPUTATION Right 05/02/2017   Procedure: AMPUTATION TRANSMETARSAL;  Surgeon: [name], MD;  Location: [place];  Service: Orthopedics;  Laterality: Right;  . AMPUTATION Right 06/13/2017   Procedure: RIGHT BELOW KNEE AMPUTATION;  Surgeon: [name], MD;  Location: [place];  Service: Orthopedics;  Laterality: Right;  . BELOW KNEE LEG AMPUTATION Right 06/13/2017  . BELOW KNEE LEG AMPUTATION    . CORONARY  STENT INTERVENTION N/A 11/10/2018   Procedure: CORONARY STENT INTERVENTION;  Surgeon: Leonie Man, MD;  Location: [place] [facility];  Service: Cardiovascular;  Laterality: N/A;  . CORONARY/GRAFT ACUTE MI REVASCULARIZATION N/A 03/21/2020   Procedure: Coronary/Graft Acute MI Revascularization;  Surgeon: [name], MD;  Location: [place] [facility];  Service: Cardiovascular;  Laterality: N/A;  . HERNIA REPAIR    . I & D EXTREMITY Left 08/21/2014   Procedure:  INCISION AND DRAINAGE LEFT SMALL FINGER;  Surgeon: Leanora Cover, MD;  Location: [facility];  Service: Orthopedics;  Laterality: Left;  . I & D EXTREMITY Right 03/18/2017   Procedure: IRRIGATION AND DEBRIDEMENT EXTREMITY;  Surgeon: [name], MD;  Location: [place];  Service: Orthopedics;  Laterality: Right;  . LEFT HEART CATH AND CORONARY ANGIOGRAPHY N/A 11/10/2018   Procedure: LEFT HEART CATH AND CORONARY ANGIOGRAPHY;  Surgeon: Leonie Man, MD;  Location: [place] [facility];  Service: Cardiovascular;  Laterality: N/A;  . LEFT HEART CATH AND CORONARY ANGIOGRAPHY N/A 03/21/2020   Procedure: LEFT HEART CATH AND CORONARY ANGIOGRAPHY;  Surgeon: [name], MD;  Location: [place] [facility];  Service: Cardiovascular;  Laterality: N/A;  . MINOR IRRIGATION AND DEBRIDEMENT OF WOUND Right 04/22/2014   Procedure: IRRIGATION AND DEBRIDEMENT OF RIGHT NECK ABCESS;  Surgeon: Jerrell Belfast, MD;  Location: [facility];  Service: ENT;  Laterality: Right;  . MULTIPLE EXTRACTIONS WITH ALVEOLOPLASTY N/A 01/18/2013   Procedure: MULTIPLE EXTRACION 3, 6, 7, 10, 11, 13, 21, 22, 27, 28, 29, 30 WITH ALVEOLOPLASTY;  Surgeon: Gae Bon, DDS;  Location: [place];  Service: Oral Surgery;  Laterality: N/A;  . SKIN SPLIT GRAFT Right 03/21/2017   Procedure: IRRIGATION AND DEBRIDEMENT RIGHT FOOT AND APPLY SPLIT THICKNESS SKIN GRAFT AND WOUND VAC;  Surgeon: [name], MD;  Location: [place];  Service: Orthopedics;  Laterality: Right;  . TEE WITHOUT CARDIOVERSION N/A 05/02/2017   Procedure: TRANSESOPHAGEAL ECHOCARDIOGRAM (TEE);  Surgeon: Thayer Headings, MD;  Location: [facility];  Service: Cardiovascular;  Laterality: N/A;  coincidental to orthopedic case    Current Medications: No outpatient medications have been marked as taking for the 04/14/20 encounter (Appointment) with Charlie Pitter, PA-C.   ***   Allergies:   Sulfur and Sulfa antibiotics   Social History   Socioeconomic History  . Marital status: Single    Spouse  name: Not on file  . Number of children: 3  . Years of education: 79  . Highest education level: Not on file  Occupational History  . Occupation: disabled  Tobacco Use  . Smoking status: Never Smoker  . Smokeless tobacco: Never Used  Vaping Use  . Vaping Use: Never used  Substance and Sexual Activity  . Alcohol use: Not Currently  . Drug use: Not Currently    Comment: OTC ASA  . Sexual activity: Yes    Partners: Female    Comment: given condoms  Other Topics Concern  . Not on file  Social History Narrative   ** Merged History Encounter **       Worked for the city of [place] for 18 years.   Unemployed.    Applying for disability.   Medicaid patient.   Social Determinants of Health   Financial Resource Strain: Not on file  Food Insecurity: Not on file  Transportation Needs: Not on file  Physical Activity: Not on file  Stress: Not on file  Social Connections: Not on file  Family History:  The patient's ***family history includes Arthritis in his father; Cancer (age of onset: 19) in his maternal grandmother; Depression in his paternal grandmother; Hypertension in his brother, father, and mother.  ROS:   Please see the history of present illness. Otherwise, review of systems is positive for ***.  All other systems are reviewed and otherwise negative.    EKGs/Labs/Other Studies Reviewed:    Studies reviewed are outlined and summarized above. Reports included below if pertinent.  Cardiac Cath 03/2020   Mid Cx lesion is 60% stenosed.  1st Mrg-1 lesion is 95% stenosed.  Previously placed 1st Mrg-2 stent (unknown type) is widely patent.  Dist LAD-1 lesion is 80% stenosed.  Dist LAD-2 lesion is 90% stenosed.  Dist LAD-3 lesion is 90% stenosed.  Dist RCA lesion is 50% stenosed.  The left ventricular systolic function is normal.  LV end diastolic pressure is normal.  The left ventricular ejection fraction is 50-55% by visual estimate.  PROCEDURE  DESCRIPTION:   The patient was brought to the second floor [facility] cath lab in the postabsorptive state. He was not Premedicated. Marland Kitchen His right groin was prepped and shaved in usual sterile fashion. Xylocaine 1% was used for local anesthesia. A 6 French sheath was inserted into the right common femoral  artery using standard Seldinger technique.  Ultrasound was used to identify the vessel and guide access.  Digital image was captured and placed the patient's chart.  A 5 French left Judkins diagnostic catheter, AL-1 diagnostic catheter and pigtail catheters were used for selective coronary angiography and left ventriculography respectively.  The RCA was anterior takeoff.  Isovue dye was used for the entirety of the case.  Retrograde aorta, ventricular and pullback pressures were recorded.  The infarct-related artery was the first obtuse marginal branch which had a 99% ostial stenosis.  This is a previously stented vessel by Dr. Ellyn Hack 1 year ago.  There was TIMI II flow.  Patient received 10,000 units of heparin with an ACT of approximate 275.  Isovue dye was used for the entirety of the case.  The patient received Brilinta 180 mg p.o.  Using a 6 French XB 3.5 mm guide catheter with a 0.14 [place] guidewire the first obtuse marginal branch was wired with some difficulty.  The origin of the LM branch was then predilated with a 2 mm x 12 mm balloon and stented with a 2.25 mm x 12 mm long Medtronic Onyx resolute drug-eluting stent carefully positioned at the ostium deployed at 14 atm (2.3 mm) resulting reduction of a 95% ostial OM1 stenosis to 0% residual.  Patient tolerated procedure well.  The guidewire and catheter were removed and the sheath was secured in place.   IMPRESSION: Successful OM1 ostial PCI drug-eluting stenting in the setting of inferolateral STEMI.  The patient was asymptomatic upon presentation and throughout the case.  There was TIMI-2 flow initially and TIMI-3 flow at the  end of the case.  This was the only lesion that could potentially be implicated with the EKG changes.  The RCA was widely patent.  It did have an anterior takeoff.  The LAD had diffuse apical disease.  The EF appeared normal with hand-injection.  The patient will need dual antiplatelet therapy uninterrupted for at least 12 months.  2D echo will be obtained.  Aggressive risk factor modification will be recommended.  The patient did have severe hyper glycemia which will need to be addressed during his hospitalization.  Quay Burow. MD, [facility]  03/22/2020 1:15 AM  2D echo 03/2020   1. Technically difficult study with poor acoustical windows and patient  cooperation even with definity contrast. Left ventricular ejection  fraction, by estimation, is 55 to 60%. The left ventricle has normal  function. Left ventricular endocardial border  not optimally defined to evaluate regional wall motion but grossly appear  normal. There is mild concentric left ventricular hypertrophy. Left  ventricular diastolic parameters are consistent with Grade I diastolic  dysfunction (impaired relaxation).  2. Right ventricular systolic function is normal. The right ventricular  size is normal. Tricuspid regurgitation signal is inadequate for assessing  PA pressure.  3. The mitral valve is normal in structure. No evidence of mitral valve  regurgitation. No evidence of mitral stenosis.  4. The aortic valve is normal in structure. Aortic valve regurgitation is  not visualized. No aortic stenosis is present.  5. Aortic dilatation noted. There is mild dilatation of the ascending  aorta and of the aortic root, measuring 38 mm.  6. The inferior vena cava is normal in size with greater than 50%  respiratory variability, suggesting right atrial pressure of 3 mmHg.      EKG:  EKG is ordered today, personally reviewed, demonstrating ***  Recent Labs: 03/21/2020: ALT 17 03/27/2020: Hemoglobin 10.9; Platelets  282 03/28/2020: BUN 33; Creatinine, Ser 2.96; Potassium 4.8; Sodium 132  Recent Lipid Panel    Component Value Date/Time   CHOL 291 (H) 03/21/2020 2241   CHOL 274 (H) 09/07/2018 1056   TRIG 310 (H) 03/21/2020 2241   HDL 30 (L) 03/21/2020 2241   HDL 29 (L) 09/07/2018 1056   CHOLHDL 9.7 03/21/2020 2241   VLDL 62 (H) 03/21/2020 2241   LDLCALC 199 (H) 03/21/2020 2241   LDLCALC 194 (H) 09/07/2018 1056    PHYSICAL EXAM:    VS:  There were no vitals taken for this visit.  BMI: There is no height or weight on file to calculate BMI.  GEN: Well nourished, well developed, in no acute distress HEENT: normocephalic, atraumatic Neck: no JVD, carotid bruits, or masses Cardiac: ***RRR; no murmurs, rubs, or gallops, no edema  Respiratory:  clear to auscultation bilaterally, normal work of breathing GI: soft, nontender, nondistended, + BS MS: no deformity or atrophy Skin: warm and dry, no rash Neuro:  Alert and Oriented x 3, Strength and sensation are intact, follows commands Psych: euthymic mood, full affect  Wt Readings from Last 3 Encounters:  04/07/20 243 lb (110.2 kg)  03/28/20 268 lb 4.8 oz (121.7 kg)  12/21/19 289 lb (131.1 kg)     ASSESSMENT & PLAN:   1. ***  Disposition: F/u with ***   Medication Adjustments/Labs and Tests Ordered: Current medicines are reviewed at length with the patient today.  Concerns regarding medicines are outlined above. Medication changes, Labs and Tests ordered today are summarized above and listed in the Patient Instructions accessible in Encounters.   Signed, Charlie Pitter, PA-C  04/12/2020 5:41 PM    [facility] [address], [place], [place]  [address] Phone: [phone]; Fax: [phone]

## 2020-04-14 ENCOUNTER — Ambulatory Visit: Payer: Medicare HMO | Admitting: Physician Assistant

## 2020-04-14 DIAGNOSIS — I10 Essential (primary) hypertension: Secondary | ICD-10-CM

## 2020-04-14 DIAGNOSIS — N184 Chronic kidney disease, stage 4 (severe): Secondary | ICD-10-CM

## 2020-04-14 DIAGNOSIS — I2510 Atherosclerotic heart disease of native coronary artery without angina pectoris: Secondary | ICD-10-CM

## 2020-04-14 DIAGNOSIS — I77819 Aortic ectasia, unspecified site: Secondary | ICD-10-CM

## 2020-04-14 DIAGNOSIS — E785 Hyperlipidemia, unspecified: Secondary | ICD-10-CM

## 2020-04-20 ENCOUNTER — Encounter: Payer: Self-pay | Admitting: Family Medicine

## 2020-04-25 ENCOUNTER — Emergency Department (HOSPITAL_COMMUNITY): Payer: Medicare HMO

## 2020-04-25 ENCOUNTER — Encounter (HOSPITAL_COMMUNITY): Payer: Self-pay | Admitting: Internal Medicine

## 2020-04-25 ENCOUNTER — Observation Stay (HOSPITAL_COMMUNITY): Payer: Medicare HMO

## 2020-04-25 ENCOUNTER — Telehealth (HOSPITAL_COMMUNITY): Payer: Self-pay

## 2020-04-25 ENCOUNTER — Inpatient Hospital Stay (HOSPITAL_COMMUNITY)
Admission: EM | Admit: 2020-04-25 | Discharge: 2020-05-03 | DRG: 065 | Disposition: A | Discharge status: Skilled Nursing Facility | Payer: Medicare HMO | Attending: Internal Medicine | Admitting: Internal Medicine

## 2020-04-25 ENCOUNTER — Other Ambulatory Visit: Payer: Self-pay

## 2020-04-25 DIAGNOSIS — I639 Cerebral infarction, unspecified: Secondary | ICD-10-CM | POA: Diagnosis not present

## 2020-04-25 DIAGNOSIS — B20 Human immunodeficiency virus [HIV] disease: Secondary | ICD-10-CM | POA: Diagnosis present

## 2020-04-25 DIAGNOSIS — E785 Hyperlipidemia, unspecified: Secondary | ICD-10-CM | POA: Diagnosis present

## 2020-04-25 DIAGNOSIS — R29701 NIHSS score 1: Secondary | ICD-10-CM | POA: Diagnosis not present

## 2020-04-25 DIAGNOSIS — I2510 Atherosclerotic heart disease of native coronary artery without angina pectoris: Secondary | ICD-10-CM | POA: Diagnosis present

## 2020-04-25 DIAGNOSIS — G8194 Hemiplegia, unspecified affecting left nondominant side: Secondary | ICD-10-CM | POA: Diagnosis present

## 2020-04-25 DIAGNOSIS — R69 Illness, unspecified: Secondary | ICD-10-CM | POA: Diagnosis not present

## 2020-04-25 DIAGNOSIS — I10 Essential (primary) hypertension: Secondary | ICD-10-CM | POA: Diagnosis not present

## 2020-04-25 DIAGNOSIS — I6521 Occlusion and stenosis of right carotid artery: Secondary | ICD-10-CM | POA: Diagnosis not present

## 2020-04-25 DIAGNOSIS — Z79899 Other long term (current) drug therapy: Secondary | ICD-10-CM

## 2020-04-25 DIAGNOSIS — D631 Anemia in chronic kidney disease: Secondary | ICD-10-CM | POA: Diagnosis present

## 2020-04-25 DIAGNOSIS — R29810 Facial weakness: Secondary | ICD-10-CM | POA: Diagnosis not present

## 2020-04-25 DIAGNOSIS — R531 Weakness: Secondary | ICD-10-CM

## 2020-04-25 DIAGNOSIS — R4781 Slurred speech: Secondary | ICD-10-CM | POA: Diagnosis not present

## 2020-04-25 DIAGNOSIS — E119 Type 2 diabetes mellitus without complications: Secondary | ICD-10-CM | POA: Diagnosis not present

## 2020-04-25 DIAGNOSIS — I739 Peripheral vascular disease, unspecified: Secondary | ICD-10-CM

## 2020-04-25 DIAGNOSIS — E1169 Type 2 diabetes mellitus with other specified complication: Secondary | ICD-10-CM

## 2020-04-25 DIAGNOSIS — E1151 Type 2 diabetes mellitus with diabetic peripheral angiopathy without gangrene: Secondary | ICD-10-CM | POA: Diagnosis present

## 2020-04-25 DIAGNOSIS — I129 Hypertensive chronic kidney disease with stage 1 through stage 4 chronic kidney disease, or unspecified chronic kidney disease: Secondary | ICD-10-CM | POA: Diagnosis present

## 2020-04-25 DIAGNOSIS — R479 Unspecified speech disturbances: Secondary | ICD-10-CM

## 2020-04-25 DIAGNOSIS — Z9114 Patient's other noncompliance with medication regimen: Secondary | ICD-10-CM

## 2020-04-25 DIAGNOSIS — Z7982 Long term (current) use of aspirin: Secondary | ICD-10-CM

## 2020-04-25 DIAGNOSIS — I6381 Other cerebral infarction due to occlusion or stenosis of small artery: Secondary | ICD-10-CM | POA: Diagnosis not present

## 2020-04-25 DIAGNOSIS — Z955 Presence of coronary angioplasty implant and graft: Secondary | ICD-10-CM

## 2020-04-25 DIAGNOSIS — E1165 Type 2 diabetes mellitus with hyperglycemia: Secondary | ICD-10-CM | POA: Diagnosis not present

## 2020-04-25 DIAGNOSIS — N184 Chronic kidney disease, stage 4 (severe): Secondary | ICD-10-CM | POA: Diagnosis present

## 2020-04-25 DIAGNOSIS — E162 Hypoglycemia, unspecified: Secondary | ICD-10-CM | POA: Diagnosis not present

## 2020-04-25 DIAGNOSIS — R29898 Other symptoms and signs involving the musculoskeletal system: Secondary | ICD-10-CM | POA: Diagnosis not present

## 2020-04-25 DIAGNOSIS — E11649 Type 2 diabetes mellitus with hypoglycemia without coma: Secondary | ICD-10-CM | POA: Diagnosis present

## 2020-04-25 DIAGNOSIS — I252 Old myocardial infarction: Secondary | ICD-10-CM

## 2020-04-25 DIAGNOSIS — Z794 Long term (current) use of insulin: Secondary | ICD-10-CM

## 2020-04-25 DIAGNOSIS — Z89511 Acquired absence of right leg below knee: Secondary | ICD-10-CM

## 2020-04-25 DIAGNOSIS — R2990 Unspecified symptoms and signs involving the nervous system: Secondary | ICD-10-CM | POA: Diagnosis not present

## 2020-04-25 DIAGNOSIS — Z20822 Contact with and (suspected) exposure to covid-19: Secondary | ICD-10-CM | POA: Diagnosis present

## 2020-04-25 DIAGNOSIS — Z9119 Patient's noncompliance with other medical treatment and regimen: Secondary | ICD-10-CM

## 2020-04-25 DIAGNOSIS — E669 Obesity, unspecified: Secondary | ICD-10-CM | POA: Diagnosis present

## 2020-04-25 DIAGNOSIS — Z6838 Body mass index (BMI) 38.0-38.9, adult: Secondary | ICD-10-CM

## 2020-04-25 DIAGNOSIS — E1122 Type 2 diabetes mellitus with diabetic chronic kidney disease: Secondary | ICD-10-CM | POA: Diagnosis present

## 2020-04-25 LAB — RAPID URINE DRUG SCREEN, HOSP PERFORMED
Amphetamines: NOT DETECTED
Barbiturates: NOT DETECTED
Benzodiazepines: NOT DETECTED
Cocaine: NOT DETECTED
Opiates: NOT DETECTED
Tetrahydrocannabinol: NOT DETECTED

## 2020-04-25 LAB — URINALYSIS, ROUTINE W REFLEX MICROSCOPIC
Bacteria, UA: NONE SEEN
Bilirubin Urine: NEGATIVE
Glucose, UA: >=500 mg/dL — AB
Ketones, ur: NEGATIVE mg/dL
Leukocytes,Ua: NEGATIVE
Nitrite: NEGATIVE
Protein, ur: >=300 mg/dL — AB
Specific Gravity, Urine: 1.014 (ref 1.005–1.030)
pH: 5 (ref 5.0–8.0)

## 2020-04-25 LAB — CBC
HCT: 36.9 % — ABNORMAL LOW (ref 39.0–52.0)
HCT: 34.9 % — ABNORMAL LOW (ref 39.0–52.0)
Hemoglobin: 11.1 g/dL — ABNORMAL LOW (ref 13.0–17.0)
Hemoglobin: 10.6 g/dL — ABNORMAL LOW (ref 13.0–17.0)
MCH: 26.1 pg (ref 26.0–34.0)
MCH: 26.4 pg (ref 26.0–34.0)
MCHC: 30.1 g/dL (ref 30.0–36.0)
MCHC: 30.4 g/dL (ref 30.0–36.0)
MCV: 86.6 fL (ref 80.0–100.0)
MCV: 86.8 fL (ref 80.0–100.0)
Platelets: 272 10*3/uL (ref 150–400)
Platelets: 246 10*3/uL (ref 150–400)
RBC: 4.26 MIL/uL (ref 4.22–5.81)
RBC: 4.02 MIL/uL — ABNORMAL LOW (ref 4.22–5.81)
RDW: 14.5 % (ref 11.5–15.5)
RDW: 14.5 % (ref 11.5–15.5)
WBC: 7 10*3/uL (ref 4.0–10.5)
WBC: 7.5 10*3/uL (ref 4.0–10.5)
nRBC: 0 % (ref 0.0–0.2)
nRBC: 0 % (ref 0.0–0.2)

## 2020-04-25 LAB — I-STAT CHEM 8, ED
BUN: 32 mg/dL — ABNORMAL HIGH (ref 6–20)
Calcium, Ion: 1.27 mmol/L (ref 1.15–1.40)
Chloride: 105 mmol/L (ref 98–111)
Creatinine, Ser: 2.9 mg/dL — ABNORMAL HIGH (ref 0.61–1.24)
Glucose, Bld: 65 mg/dL — ABNORMAL LOW (ref 70–99)
HCT: 35 % — ABNORMAL LOW (ref 39.0–52.0)
Hemoglobin: 11.9 g/dL — ABNORMAL LOW (ref 13.0–17.0)
Potassium: 4.1 mmol/L (ref 3.5–5.1)
Sodium: 140 mmol/L (ref 135–145)
TCO2: 26 mmol/L (ref 22–32)

## 2020-04-25 LAB — COMPREHENSIVE METABOLIC PANEL
ALT: 12 U/L (ref 0–44)
AST: 15 U/L (ref 15–41)
Albumin: 3.3 g/dL — ABNORMAL LOW (ref 3.5–5.0)
Alkaline Phosphatase: 68 U/L (ref 38–126)
Anion gap: 11 (ref 5–15)
BUN: 30 mg/dL — ABNORMAL HIGH (ref 6–20)
CO2: 22 mmol/L (ref 22–32)
Calcium: 9.6 mg/dL (ref 8.9–10.3)
Chloride: 103 mmol/L (ref 98–111)
Creatinine, Ser: 2.8 mg/dL — ABNORMAL HIGH (ref 0.61–1.24)
GFR, Estimated: 26 mL/min — ABNORMAL LOW (ref >60)
Glucose, Bld: 66 mg/dL — ABNORMAL LOW (ref 70–99)
Potassium: 4 mmol/L (ref 3.5–5.1)
Sodium: 136 mmol/L (ref 135–145)
Total Bilirubin: 0.6 mg/dL (ref 0.3–1.2)
Total Protein: 7.9 g/dL (ref 6.5–8.1)

## 2020-04-25 LAB — TSH: TSH: 2.331 u[IU]/mL (ref 0.350–4.500)

## 2020-04-25 LAB — CBG MONITORING, ED
Glucose-Capillary: 141 mg/dL — ABNORMAL HIGH (ref 70–99)
Glucose-Capillary: 222 mg/dL — ABNORMAL HIGH (ref 70–99)
Glucose-Capillary: 257 mg/dL — ABNORMAL HIGH (ref 70–99)
Glucose-Capillary: 504 mg/dL (ref 70–99)
Glucose-Capillary: 371 mg/dL — ABNORMAL HIGH (ref 70–99)
Glucose-Capillary: 386 mg/dL — ABNORMAL HIGH (ref 70–99)
Glucose-Capillary: 371 mg/dL — ABNORMAL HIGH (ref 70–99)
Glucose-Capillary: 400 mg/dL — ABNORMAL HIGH (ref 70–99)
Glucose-Capillary: 351 mg/dL — ABNORMAL HIGH (ref 70–99)
Glucose-Capillary: 354 mg/dL — ABNORMAL HIGH (ref 70–99)
Glucose-Capillary: 316 mg/dL — ABNORMAL HIGH (ref 70–99)

## 2020-04-25 LAB — RESP PANEL BY RT-PCR (FLU A&B, COVID) ARPGX2
Influenza A by PCR: NEGATIVE
Influenza B by PCR: NEGATIVE
SARS Coronavirus 2 by RT PCR: NEGATIVE

## 2020-04-25 LAB — GLUCOSE, CAPILLARY: Glucose-Capillary: 273 mg/dL — ABNORMAL HIGH (ref 70–99)

## 2020-04-25 LAB — PROTIME-INR
INR: 1 (ref 0.8–1.2)
Prothrombin Time: 12.8 seconds (ref 11.4–15.2)

## 2020-04-25 LAB — CREATININE, SERUM
Creatinine, Ser: 2.65 mg/dL — ABNORMAL HIGH (ref 0.61–1.24)
GFR, Estimated: 28 mL/min — ABNORMAL LOW (ref >60)

## 2020-04-25 LAB — ETHANOL: Alcohol, Ethyl (B): <10 mg/dL (ref <10)

## 2020-04-25 LAB — LIPID PANEL
Cholesterol: 280 mg/dL — ABNORMAL HIGH (ref 0–200)
HDL: 32 mg/dL — ABNORMAL LOW (ref >40)
LDL Cholesterol: 205 mg/dL — ABNORMAL HIGH (ref 0–99)
Total CHOL/HDL Ratio: 8.8 RATIO
Triglycerides: 217 mg/dL — ABNORMAL HIGH (ref <150)
VLDL: 43 mg/dL — ABNORMAL HIGH (ref 0–40)

## 2020-04-25 LAB — DIFFERENTIAL
Abs Immature Granulocytes: 0.03 10*3/uL (ref 0.00–0.07)
Basophils Absolute: 0 10*3/uL (ref 0.0–0.1)
Basophils Relative: 0 %
Eosinophils Absolute: 0.2 10*3/uL (ref 0.0–0.5)
Eosinophils Relative: 3 %
Immature Granulocytes: 0 %
Lymphocytes Relative: 46 %
Lymphs Abs: 3.1 10*3/uL (ref 0.7–4.0)
Monocytes Absolute: 0.6 10*3/uL (ref 0.1–1.0)
Monocytes Relative: 8 %
Neutro Abs: 3 10*3/uL (ref 1.7–7.7)
Neutrophils Relative %: 43 %

## 2020-04-25 LAB — APTT: aPTT: 28 seconds (ref 24–36)

## 2020-04-25 MED ORDER — ACETAMINOPHEN 325 MG PO TABS
650.0000 mg | ORAL_TABLET | ORAL | Status: DC | PRN
  Administered 2020-05-03: 650 mg via ORAL
  Filled 2020-04-25: qty 2

## 2020-04-25 MED ORDER — INSULIN ASPART 100 UNIT/ML ~~LOC~~ SOLN
0.0000 [IU] | Freq: Three times a day (TID) | SUBCUTANEOUS | Status: DC
  Administered 2020-04-25: 7 [IU] via SUBCUTANEOUS
  Administered 2020-04-26: 2 [IU] via SUBCUTANEOUS
  Administered 2020-04-26: 3 [IU] via SUBCUTANEOUS
  Administered 2020-04-27: 5 [IU] via SUBCUTANEOUS
  Administered 2020-04-27: 3 [IU] via SUBCUTANEOUS
  Administered 2020-04-28: 2 [IU] via SUBCUTANEOUS
  Administered 2020-04-29: 3 [IU] via SUBCUTANEOUS
  Administered 2020-04-29 – 2020-04-30 (×4): 2 [IU] via SUBCUTANEOUS
  Administered 2020-04-30: 3 [IU] via SUBCUTANEOUS
  Administered 2020-05-01 – 2020-05-02 (×6): 2 [IU] via SUBCUTANEOUS
  Administered 2020-05-03: 1 [IU] via SUBCUTANEOUS

## 2020-04-25 MED ORDER — CARVEDILOL 12.5 MG PO TABS
25.0000 mg | ORAL_TABLET | Freq: Two times a day (BID) | ORAL | Status: DC
  Administered 2020-04-25 – 2020-05-03 (×17): 25 mg via ORAL
  Filled 2020-04-25 (×8): qty 2
  Filled 2020-04-25: qty 8
  Filled 2020-04-25 (×8): qty 2

## 2020-04-25 MED ORDER — AMLODIPINE BESYLATE 10 MG PO TABS
10.0000 mg | ORAL_TABLET | Freq: Every day | ORAL | Status: DC
  Administered 2020-04-25 – 2020-04-27 (×3): 10 mg via ORAL
  Filled 2020-04-25: qty 1
  Filled 2020-04-25: qty 2
  Filled 2020-04-25: qty 1

## 2020-04-25 MED ORDER — ENOXAPARIN SODIUM 40 MG/0.4ML ~~LOC~~ SOLN
40.0000 mg | SUBCUTANEOUS | Status: DC
  Administered 2020-04-25 – 2020-05-03 (×9): 40 mg via SUBCUTANEOUS
  Filled 2020-04-25 (×9): qty 0.4

## 2020-04-25 MED ORDER — NITROGLYCERIN 0.4 MG SL SUBL: 0.4000 mg | SUBLINGUAL_TABLET | SUBLINGUAL | Status: DC | PRN

## 2020-04-25 MED ORDER — INSULIN DETEMIR 100 UNIT/ML ~~LOC~~ SOLN
35.0000 [IU] | Freq: Every day | SUBCUTANEOUS | Status: DC
  Administered 2020-04-25 – 2020-04-26 (×2): 35 [IU] via SUBCUTANEOUS
  Filled 2020-04-25 (×2): qty 0.35

## 2020-04-25 MED ORDER — TICAGRELOR 90 MG PO TABS
90.0000 mg | ORAL_TABLET | Freq: Two times a day (BID) | ORAL | Status: DC
  Administered 2020-04-25 – 2020-05-03 (×17): 90 mg via ORAL
  Filled 2020-04-25 (×17): qty 1

## 2020-04-25 MED ORDER — OXYCODONE HCL 5 MG PO TABS
10.0000 mg | ORAL_TABLET | ORAL | Status: DC | PRN
  Administered 2020-04-25 – 2020-05-03 (×7): 10 mg via ORAL
  Filled 2020-04-25 (×8): qty 2

## 2020-04-25 MED ORDER — ATORVASTATIN CALCIUM 80 MG PO TABS
80.0000 mg | ORAL_TABLET | Freq: Every day | ORAL | Status: DC
  Administered 2020-04-25 – 2020-05-02 (×8): 80 mg via ORAL
  Filled 2020-04-25 (×8): qty 1

## 2020-04-25 MED ORDER — ASPIRIN EC 81 MG PO TBEC
81.0000 mg | DELAYED_RELEASE_TABLET | Freq: Every day | ORAL | Status: DC
  Administered 2020-04-25 – 2020-05-03 (×9): 81 mg via ORAL
  Filled 2020-04-25 (×10): qty 1

## 2020-04-25 MED ORDER — ACETAMINOPHEN 650 MG RE SUPP: 650.0000 mg | RECTAL | Status: DC | PRN

## 2020-04-25 MED ORDER — INSULIN ASPART 100 UNIT/ML ~~LOC~~ SOLN
0.0000 [IU] | Freq: Every day | SUBCUTANEOUS | Status: DC
  Administered 2020-04-25 – 2020-04-26 (×2): 3 [IU] via SUBCUTANEOUS

## 2020-04-25 MED ORDER — DAPSONE 100 MG PO TABS
100.0000 mg | ORAL_TABLET | Freq: Every day | ORAL | Status: DC
  Administered 2020-04-25 – 2020-05-03 (×9): 100 mg via ORAL
  Filled 2020-04-25 (×9): qty 1

## 2020-04-25 MED ORDER — OXYBUTYNIN CHLORIDE 5 MG PO TABS
5.0000 mg | ORAL_TABLET | Freq: Two times a day (BID) | ORAL | Status: DC
  Administered 2020-04-25 – 2020-05-03 (×16): 5 mg via ORAL
  Filled 2020-04-25 (×17): qty 1

## 2020-04-25 MED ORDER — GADOBUTROL 1 MMOL/ML IV SOLN
9.0000 mL | Freq: Once | INTRAVENOUS | Status: AC | PRN
  Administered 2020-04-25: 9 mL via INTRAVENOUS

## 2020-04-25 MED ORDER — ACETAMINOPHEN 160 MG/5ML PO SOLN: 650.0000 mg | ORAL | Status: DC | PRN

## 2020-04-25 MED ORDER — STROKE: EARLY STAGES OF RECOVERY BOOK: Freq: Once | Status: DC

## 2020-04-25 MED ORDER — ICOSAPENT ETHYL 1 G PO CAPS
2.0000 g | ORAL_CAPSULE | Freq: Two times a day (BID) | ORAL | Status: DC
  Administered 2020-04-25 – 2020-05-03 (×17): 2 g via ORAL
  Filled 2020-04-25 (×18): qty 2

## 2020-04-25 MED ORDER — BICTEGRAVIR-EMTRICITAB-TENOFOV 50-200-25 MG PO TABS
1.0000 | ORAL_TABLET | Freq: Every day | ORAL | Status: DC
  Administered 2020-04-25 – 2020-05-03 (×9): 1 via ORAL
  Filled 2020-04-25 (×9): qty 1

## 2020-04-25 MED ORDER — GABAPENTIN 100 MG PO CAPS
100.0000 mg | ORAL_CAPSULE | Freq: Three times a day (TID) | ORAL | Status: DC
  Administered 2020-04-25 – 2020-05-03 (×24): 100 mg via ORAL
  Filled 2020-04-25 (×25): qty 1

## 2020-04-25 NOTE — H&P (Signed)
History and Physical    Keith [name] WNU:[id number] DOB: 05-25-1965 DOA: 04/25/2020  PCP: [name], FNP  Patient coming from: Home.  Chief Complaint: Left-sided weakness.  HPI: Keith [name] is a 55 y.o. male with history of CAD status post recent stenting last month for ST elevation MI with history of diabetes mellitus type 2, chronic and disease stage IV, right BKA, anemia was brought to the ER after patient had left-sided weakness.  Patient states he took his insulin last night long-acting 55 units along with his Premeal insulin following which he did not eat.  He started feeling weak on the left upper and lower extremity followed by left facial droop and slurred speech at around 8 PM.  Did not have any weakness on the right side.  ED Course: In the ER patient was evaluated by neurologist CT head was unremarkable.  Blood sugars were in the 60s.  Patient passed swallow evaluation.  Labs are largely unremarkable and well at baseline.  At this time neurology recommended getting MRI brain and if is positive further stroke work-up.  COVID test is negative.  Patient's left-sided weakness is largely improved by the time I saw patient.  Review of Systems: As per HPI, rest all negative.   Past Medical History:  Diagnosis Date  . Abscess of right foot    abscess/ulcer of R transtibial amputation requiring IV abx  . AIDS ([place])   . Anemia   . CAD (coronary artery disease)    a. MI with stenting of OM1 in 11/2018 with residual disease. b. acute STEMI 03/2020 s/p DES to OM1  . Chronic anemia   . Chronic knee pain    right  . Chronic pain   . CKD (chronic kidney disease), stage IV ([place])   . Diabetes type 2, uncontrolled ([place])    HgA1c 17.6 (04/27/2010)  . Diabetic foot ulcer ([place]) 01/2017   right foot  . Dilatation of aorta (HCC)   . Erectile dysfunction   . Genital warts   . HIV (human immunodeficiency virus infection) ([place]) 2009   CD4 count 100, VL 13800 (05/01/2010)  .  Hyperlipidemia   . Hypertension   . Noncompliance with medication regimen   . Osteomyelitis ([place])    h/o hand  . Osteomyelitis of metatarsal ([place]) 04/28/2017    Past Surgical History:  Procedure Laterality Date  . AMPUTATION Right 05/02/2017   Procedure: AMPUTATION TRANSMETARSAL;  Surgeon: [name], MD;  Location: [place];  Service: Orthopedics;  Laterality: Right;  . AMPUTATION Right 06/13/2017   Procedure: RIGHT BELOW KNEE AMPUTATION;  Surgeon: [name], MD;  Location: [place];  Service: Orthopedics;  Laterality: Right;  . BELOW KNEE LEG AMPUTATION Right 06/13/2017  . BELOW KNEE LEG AMPUTATION    . CORONARY STENT INTERVENTION N/A 11/10/2018   Procedure: CORONARY STENT INTERVENTION;  Surgeon: Leonie Man, MD;  Location: [place] [facility];  Service: Cardiovascular;  Laterality: N/A;  . CORONARY/GRAFT ACUTE MI REVASCULARIZATION N/A 03/21/2020   Procedure: Coronary/Graft Acute MI Revascularization;  Surgeon: [name], MD;  Location: [place] [facility];  Service: Cardiovascular;  Laterality: N/A;  . HERNIA REPAIR    . I & D EXTREMITY Left 08/21/2014   Procedure: INCISION AND DRAINAGE LEFT SMALL FINGER;  Surgeon: Leanora Cover, MD;  Location: [place];  Service: Orthopedics;  Laterality: Left;  . I & D EXTREMITY Right 03/18/2017   Procedure: IRRIGATION AND DEBRIDEMENT EXTREMITY;  Surgeon: [name], MD;  Location: [place];  Service: Orthopedics;  Laterality: Right;  . LEFT HEART CATH AND CORONARY ANGIOGRAPHY N/A 11/10/2018   Procedure: LEFT HEART CATH AND CORONARY ANGIOGRAPHY;  Surgeon: Leonie Man, MD;  Location: [place] [facility];  Service: Cardiovascular;  Laterality: N/A;  . LEFT HEART CATH AND CORONARY ANGIOGRAPHY N/A 03/21/2020   Procedure: LEFT HEART CATH AND CORONARY ANGIOGRAPHY;  Surgeon: [name], MD;  Location: [place] [facility];  Service: Cardiovascular;  Laterality: N/A;  . MINOR IRRIGATION AND DEBRIDEMENT OF WOUND Right 04/22/2014   Procedure:  IRRIGATION AND DEBRIDEMENT OF RIGHT NECK ABCESS;  Surgeon: Jerrell Belfast, MD;  Location: [place];  Service: ENT;  Laterality: Right;  . MULTIPLE EXTRACTIONS WITH ALVEOLOPLASTY N/A 01/18/2013   Procedure: MULTIPLE EXTRACION 3, 6, 7, 10, 11, 13, 21, 22, 27, 28, 29, 30 WITH ALVEOLOPLASTY;  Surgeon: Gae Bon, DDS;  Location: [place];  Service: Oral Surgery;  Laterality: N/A;  . SKIN SPLIT GRAFT Right 03/21/2017   Procedure: IRRIGATION AND DEBRIDEMENT RIGHT FOOT AND APPLY SPLIT THICKNESS SKIN GRAFT AND WOUND VAC;  Surgeon: [name], MD;  Location: [place];  Service: Orthopedics;  Laterality: Right;  . TEE WITHOUT CARDIOVERSION N/A 05/02/2017   Procedure: TRANSESOPHAGEAL ECHOCARDIOGRAM (TEE);  Surgeon: Acie Fredrickson Wonda Cheng, MD;  Location: [facility] OR;  Service: Cardiovascular;  Laterality: N/A;  coincidental to orthopedic case     reports that he has never smoked. He has never used smokeless tobacco. He reports previous alcohol use. He reports previous drug use.  Allergies  Allergen Reactions  . Elemental Sulfur Itching    Patient stated he's allergic to "sulfur" AND "sulfa"  . Sulfa Antibiotics Itching    Family History  Problem Relation Age of Onset  . Hypertension Mother   . Arthritis Father   . Hypertension Father   . Hypertension Brother   . Cancer Maternal Grandmother 19       unknown type of cancer  . Depression Paternal Grandmother     Prior to Admission medications   Medication Sig Start Date End Date Taking? Authorizing Provider  amLODipine (NORVASC) 10 MG tablet Take 1 tablet (10 mg total) by mouth daily. 03/27/20   [name], MD  aspirin 81 MG EC tablet Take 1 tablet (81 mg total) by mouth daily. 03/27/20   [name], MD  atorvastatin (LIPITOR) 80 MG tablet Take 1 tablet (80 mg total) by mouth daily at 6 PM. 03/27/20   [name], MD  BIKTARVY 50-200-25 MG TABS tablet TAKE 1 TABLET BY MOUTH DAILY. Patient taking differently: Take 1 tablet by mouth daily.  03/20/20   [name], MD  blood glucose meter kit and supplies Dispense based on patient and insurance preference. Use up to four times daily as directed. (FOR ICD-10 E10.9, E11.9). 04/07/20   [name], FNP  carvedilol (COREG) 25 MG tablet Take 1 tablet (25 mg total) by mouth 2 (two) times daily with a meal. Please make appt for future refills. [phone]. 1st attempt. 03/27/20   [name], MD  dapsone 100 MG tablet Take 1 tablet (100 mg total) by mouth daily. 03/28/20   [name], Ava, DO  EASY COMFORT PEN NEEDLES 31G X 5 MM MISC INJECT 20 UNITS DAILY AS DIRECTED. 11/10/19   [name], FNP  gabapentin (NEURONTIN) 100 MG capsule Take 1 capsule (100 mg total) by mouth 3 (three) times daily. 03/28/20   [name], Ava, DO  glucose blood (COOL BLOOD  GLUCOSE TEST STRIPS) test strip Use as instructed 02/16/20   [name], FNP  icosapent Ethyl (VASCEPA) 1 g capsule Take 2 capsules (2 g total) by mouth 2 (two) times daily. 03/27/20   [name], MD  insulin aspart (NOVOLOG FLEXPEN) 100 UNIT/ML FlexPen Inject 10 units under skin 3 times a day, with meals. 03/27/20   [name], Ava, DO  Insulin Syringes, Disposable, U-100 0.3 ML MISC 1 Syringe by Does not apply route 3 (three) times daily. 02/16/20   [name], FNP  Lancets [facility] ULTRASOFT) lancets Use as instructed 02/16/20   [name], FNP  LEVEMIR 100 UNIT/ML injection Inject 0.55 mLs (55 Units total) into the skin daily. 03/28/20 11/18/21  [name], Ava, DO  nitroGLYCERIN (NITROSTAT) 0.4 MG SL tablet Place 1 tablet (0.4 mg total) under the tongue every 5 (five) minutes x 3 doses as needed for chest pain. 03/27/20   [name], Ava, DO  oxybutynin (DITROPAN) 5 MG tablet Take 1 tablet (5 mg total) by mouth 2 (two) times daily. 03/27/20   [name], MD  oxyCODONE 10 MG TABS Take 1 tablet (10 mg total) by mouth daily as needed for up to 5 doses for severe pain or breakthrough pain (pain). 11/03/18    Kayleen Memos, DO  silver sulfADIAZINE (SILVADENE) 1 % cream Apply 1 application topically daily. Apply to wound daily Patient not taking: Reported on 03/22/2020 11/06/18   [name], MD  ticagrelor (BRILINTA) 90 MG TABS tablet Take 1 tablet (90 mg total) by mouth 2 (two) times daily. 03/27/20   [name], Ava, DO    Physical Exam: Constitutional: Moderately built and nourished. Vitals:   04/25/20 0149 04/25/20 0227 04/25/20 0300 04/25/20 0400  BP: 132/81 116/87 (!) 140/110   Pulse: 68 65 67   Resp: 16   12  SpO2: 100% 98% 98%   Weight:       Eyes: Anicteric no pallor. ENMT: No discharge from the ears eyes nose or mouth. Neck: No mass felt.  No neck rigidity. Respiratory: No rhonchi or crepitations. Cardiovascular: S1-S2 heard. Abdomen: Soft nontender bowel sounds present. Musculoskeletal: No edema. Skin: No rash. Neurologic: Alert awake oriented to time place and person.  Moves all extremities 5 x 5.  No facial asymmetry tongue is midline.  Pupils equal and reacting to light. Psychiatric: Appears normal.  Normal affect.   Labs on Admission: I have personally reviewed following labs and imaging studies  CBC: Recent Labs  Lab 04/25/20 0126 04/25/20 0128  WBC 7.0  --   NEUTROABS 3.0  --   HGB 11.1* 11.9*  HCT 36.9* 35.0*  MCV 86.6  --   PLT 272  --    Basic Metabolic Panel: Recent Labs  Lab 04/25/20 0126 04/25/20 0128  NA 136 140  K 4.0 4.1  CL 103 105  CO2 22  --   GLUCOSE 66* 65*  BUN 30* 32*  CREATININE 2.80* 2.90*  CALCIUM 9.6  --    GFR: Estimated Creatinine Clearance: 36.5 mL/min (A) (by C-G formula based on SCr of 2.9 mg/dL (H)). Liver Function Tests: Recent Labs  Lab 04/25/20 0126  AST 15  ALT 12  ALKPHOS 68  BILITOT 0.6  PROT 7.9  ALBUMIN 3.3*   No results for input(s): LIPASE, AMYLASE in the last 168 hours. No results for input(s): AMMONIA in the last 168 hours. Coagulation Profile: Recent Labs  Lab 04/25/20 0126  INR 1.0    Cardiac Enzymes: No  results for input(s): CKTOTAL, CKMB, CKMBINDEX, TROPONINI in the last 168 hours. BNP (last 3 results) No results for input(s): PROBNP in the last 8760 hours. HbA1C: No results for input(s): HGBA1C in the last 72 hours. CBG: No results for input(s): GLUCAP in the last 168 hours. Lipid Profile: No results for input(s): CHOL, HDL, LDLCALC, TRIG, CHOLHDL, LDLDIRECT in the last 72 hours. Thyroid Function Tests: No results for input(s): TSH, T4TOTAL, FREET4, T3FREE, THYROIDAB in the last 72 hours. Anemia Panel: No results for input(s): VITAMINB12, FOLATE, FERRITIN, TIBC, IRON, RETICCTPCT in the last 72 hours. Urine analysis:    Component Value Date/Time   COLORURINE AMBER (A) 03/23/2020 1526   APPEARANCEUR CLOUDY (A) 03/23/2020 1526   LABSPEC 1.031 (H) 03/23/2020 1526   PHURINE 5.0 03/23/2020 1526   GLUCOSEU >=500 (A) 03/23/2020 1526   HGBUR NEGATIVE 03/23/2020 1526   BILIRUBINUR NEGATIVE 03/23/2020 1526   BILIRUBINUR negative 09/07/2018 [place] 03/23/2020 1526   PROTEINUR >=300 (A) 03/23/2020 1526   UROBILINOGEN 1.0 09/07/2018 1034   UROBILINOGEN 1.0 01/01/2017 1032   NITRITE NEGATIVE 03/23/2020 1526   LEUKOCYTESUR NEGATIVE 03/23/2020 1526   Sepsis Labs: $[email or web address]$ (procalcitonin:4,lacticidven:4) )No results found for this or any previous visit (from the past 240 hour(s)).   Radiological Exams on Admission: CT HEAD CODE STROKE WO CONTRAST  Result Date: 04/25/2020 CLINICAL DATA:  Code stroke. Initial evaluation for slurred speech, left-sided weakness. [place] side EXAM: CT HEAD WITHOUT CONTRAST TECHNIQUE: Contiguous axial images were obtained from the base of the skull through the vertex without intravenous contrast. COMPARISON:  Prior CT from 10/11/2018. FINDINGS: Brain: Cerebral volume within normal limits. No acute intracranial hemorrhage. No acute large vessel territory infarct. No mass lesion, midline shift or mass effect. No hydrocephalus  or extra-axial fluid collection. Vascular: No hyperdense vessel. Skull: Scalp soft tissues and calvarium within normal limits. Sinuses/Orbits: Globes and orbital soft tissues demonstrate no acute finding. Probable remote fractures noted involving the bilateral lamina papyracea. Mild mucosal thickening noted about the maxillary sinuses. Paranasal sinuses are otherwise clear. No mastoid effusion. Other: None. ASPECTS [facility] Stroke Program Early CT Score) - Ganglionic level infarction (caudate, lentiform nuclei, internal capsule, insula, M1-M3 cortex): 7 - Supraganglionic infarction (M4-M6 cortex): 3 Total score (0-10 with 10 being normal): 10 IMPRESSION: 1. No acute intracranial infarct or other abnormality. 2. ASPECTS is 10. These results were communicated to Dr. Theda Sers at [place] 1/18/2022by text page via the [facility] messaging system. Electronically Signed   By: Jeannine Boga M.D.   On: 04/25/2020 01:57      Assessment/Plan Principal Problem:   Left-sided weakness Active Problems:   Essential hypertension   Insulin-requiring or dependent type II diabetes mellitus (HCC)   HIV disease (HCC)   Hypoglycemia    1. Left-sided weakness with slurred speech and left facial droop is largely improved at the time of my exam.  Appreciate neurology consult.  Per neurologist, if patient's MRI brain does show a stroke then further stroke work-up. 2. Hypoglycemia with history of diabetes mellitus type 2 poorly controlled last month patient was kept on Levemir and NovoLog.  Patient states he took his insulin without eating.  Presently hypoglycemic with blood sugars in the 60s.  Will check CBGs every hour until blood sugar stable.  I have held patient's insulin for now. 3. CAD status post recent stenting we will continue patient's aspirin Brilinta statins and beta-blocker. 4. HIV continue Biktarvy and dapsone. 5. Hypertension on amlodipine and Coreg. 6. Chronic kidney disease stage  IV with anemia follow  metabolic panel and CBC. 7. History of chronic pain.   DVT prophylaxis: Lovenox. Code Status: Full code. Family Communication: Discussed with patient. Disposition Plan: To be determined. Consults called: Neurology. Admission status: Observation.   Rise Patience MD [facility] Pager [phone].  If 7PM-7AM, please contact night-coverage [email or web address] Password [facility]  04/25/2020, 4:57 AM

## 2020-04-25 NOTE — ED Provider Notes (Signed)
[facility] Emergency Department Provider Note MRN:  [name]  Arrival date & time: 04/25/20     Chief Complaint   Arm weakness History of Present Illness   Keith [name] is a 55 y.o. year-old male with a history of CAD, HIV, CKD presenting to the ED with chief complaint of arm weakness.  Patient noticed left arm weakness at midnight.  Last known normal 8 PM.  EMS called.  In route, patient worsening now with facial droop and slurred speech.  Code stroke initiated.  Review of Systems  A complete 10 system review of systems was obtained and all systems are negative except as noted in the HPI and PMH.   Patient's Health History    Past Medical History:  Diagnosis Date  . Abscess of right foot    abscess/ulcer of R transtibial amputation requiring IV abx  . AIDS ([place])   . Anemia   . CAD (coronary artery disease)    a. MI with stenting of OM1 in 11/2018 with residual disease. b. acute STEMI 03/2020 s/p DES to OM1  . Chronic anemia   . Chronic knee pain    right  . Chronic pain   . CKD (chronic kidney disease), stage IV ([place])   . Diabetes type 2, uncontrolled ([place])    HgA1c 17.6 (04/27/2010)  . Diabetic foot ulcer ([place]) 01/2017   right foot  . Dilatation of aorta (HCC)   . Erectile dysfunction   . Genital warts   . HIV (human immunodeficiency virus infection) ([place]) 2009   CD4 count 100, VL 13800 (05/01/2010)  . Hyperlipidemia   . Hypertension   . Noncompliance with medication regimen   . Osteomyelitis ([place])    h/o hand  . Osteomyelitis of metatarsal ([place]) 04/28/2017    Past Surgical History:  Procedure Laterality Date  . AMPUTATION Right 05/02/2017   Procedure: AMPUTATION TRANSMETARSAL;  Surgeon: [name], MD;  Location: [place];  Service: Orthopedics;  Laterality: Right;  . AMPUTATION Right 06/13/2017   Procedure: RIGHT BELOW KNEE AMPUTATION;  Surgeon: [name], MD;  Location: [place];  Service: Orthopedics;  Laterality: Right;  . BELOW  KNEE LEG AMPUTATION Right 06/13/2017  . BELOW KNEE LEG AMPUTATION    . CORONARY STENT INTERVENTION N/A 11/10/2018   Procedure: CORONARY STENT INTERVENTION;  Surgeon: Leonie Man, MD;  Location: [place] [facility];  Service: Cardiovascular;  Laterality: N/A;  . CORONARY/GRAFT ACUTE MI REVASCULARIZATION N/A 03/21/2020   Procedure: Coronary/Graft Acute MI Revascularization;  Surgeon: [name], MD;  Location: [place] [facility];  Service: Cardiovascular;  Laterality: N/A;  . HERNIA REPAIR    . I & D EXTREMITY Left 08/21/2014   Procedure: INCISION AND DRAINAGE LEFT SMALL FINGER;  Surgeon: Leanora Cover, MD;  Location: [place];  Service: Orthopedics;  Laterality: Left;  . I & D EXTREMITY Right 03/18/2017   Procedure: IRRIGATION AND DEBRIDEMENT EXTREMITY;  Surgeon: [name], MD;  Location: [place];  Service: Orthopedics;  Laterality: Right;  . LEFT HEART CATH AND CORONARY ANGIOGRAPHY N/A 11/10/2018   Procedure: LEFT HEART CATH AND CORONARY ANGIOGRAPHY;  Surgeon: Leonie Man, MD;  Location: [place] [facility];  Service: Cardiovascular;  Laterality: N/A;  . LEFT HEART CATH AND CORONARY ANGIOGRAPHY N/A 03/21/2020   Procedure: LEFT HEART CATH AND CORONARY ANGIOGRAPHY;  Surgeon: [name], MD;  Location: [place] [facility];  Service: Cardiovascular;  Laterality: N/A;  . MINOR IRRIGATION AND DEBRIDEMENT OF  WOUND Right 04/22/2014   Procedure: IRRIGATION AND DEBRIDEMENT OF RIGHT NECK ABCESS;  Surgeon: Jerrell Belfast, MD;  Location: [place];  Service: ENT;  Laterality: Right;  . MULTIPLE EXTRACTIONS WITH ALVEOLOPLASTY N/A 01/18/2013   Procedure: MULTIPLE EXTRACION 3, 6, 7, 10, 11, 13, 21, 22, 27, 28, 29, 30 WITH ALVEOLOPLASTY;  Surgeon: Gae Bon, DDS;  Location: [place];  Service: Oral Surgery;  Laterality: N/A;  . SKIN SPLIT GRAFT Right 03/21/2017   Procedure: IRRIGATION AND DEBRIDEMENT RIGHT FOOT AND APPLY SPLIT THICKNESS SKIN GRAFT AND WOUND VAC;  Surgeon: [name], MD;   Location: [place];  Service: Orthopedics;  Laterality: Right;  . TEE WITHOUT CARDIOVERSION N/A 05/02/2017   Procedure: TRANSESOPHAGEAL ECHOCARDIOGRAM (TEE);  Surgeon: Acie Fredrickson Wonda Cheng, MD;  Location: [facility] OR;  Service: Cardiovascular;  Laterality: N/A;  coincidental to orthopedic case    Family History  Problem Relation Age of Onset  . Hypertension Mother   . Arthritis Father   . Hypertension Father   . Hypertension Brother   . Cancer Maternal Grandmother 28       unknown type of cancer  . Depression Paternal Grandmother     Social History   Socioeconomic History  . Marital status: Single    Spouse name: Not on file  . Number of children: 3  . Years of education: 37  . Highest education level: Not on file  Occupational History  . Occupation: disabled  Tobacco Use  . Smoking status: Never Smoker  . Smokeless tobacco: Never Used  Vaping Use  . Vaping Use: Never used  Substance and Sexual Activity  . Alcohol use: Not Currently  . Drug use: Not Currently    Comment: OTC ASA  . Sexual activity: Yes    Partners: Female    Comment: given condoms  Other Topics Concern  . Not on file  Social History Narrative   ** Merged History Encounter **       Worked for the city of [place] for 18 years.   Unemployed.    Applying for disability.   Medicaid patient.   Social Determinants of Health   Financial Resource Strain: Not on file  Food Insecurity: Not on file  Transportation Needs: Not on file  Physical Activity: Not on file  Stress: Not on file  Social Connections: Not on file  Intimate Partner Violence: Not on file     Physical Exam   Vitals:   04/25/20 0149 04/25/20 0227  BP: 132/81 116/87  Pulse: 68 65  Resp: 16   SpO2: 100% 98%    CONSTITUTIONAL: Well-appearing, NAD NEURO:  Alert and oriented x 3, left arm weakness, left facial droop, slurred speech EYES:  eyes equal and reactive ENT/NECK:  no LAD, no JVD CARDIO: Regular rate, well-perfused, normal S1 and  S2 PULM:  CTAB no wheezing or rhonchi GI/GU:  normal bowel sounds, non-distended, non-tender MSK/SPINE:  No gross deformities, no edema SKIN:  no rash, atraumatic PSYCH:  Appropriate speech and behavior  *Additional and/or pertinent findings included in MDM below  Diagnostic and Interventional Summary    EKG Interpretation  Date/Time:    Ventricular Rate:    PR Interval:    QRS Duration:   QT Interval:    QTC Calculation:   R Axis:     Text Interpretation:        Labs Reviewed  CBC - Abnormal; Notable for the following components:      Result Value   Hemoglobin 11.1 (*)  HCT 36.9 (*)    All other components within normal limits  COMPREHENSIVE METABOLIC PANEL - Abnormal; Notable for the following components:   Glucose, Bld 66 (*)    BUN 30 (*)    Creatinine, Ser 2.80 (*)    Albumin 3.3 (*)    GFR, Estimated 26 (*)    All other components within normal limits  I-STAT CHEM 8, ED - Abnormal; Notable for the following components:   BUN 32 (*)    Creatinine, Ser 2.90 (*)    Glucose, Bld 65 (*)    Hemoglobin 11.9 (*)    HCT 35.0 (*)    All other components within normal limits  RESP PANEL BY RT-PCR (FLU A&B, COVID) ARPGX2  PROTIME-INR  APTT  DIFFERENTIAL  ETHANOL  RAPID URINE DRUG SCREEN, HOSP PERFORMED  URINALYSIS, ROUTINE W REFLEX MICROSCOPIC    CT HEAD CODE STROKE WO CONTRAST  Final Result    MR BRAIN WO CONTRAST    (Results Pending)  MR ANGIO HEAD WO CONTRAST    (Results Pending)  MR Angiogram Neck W or Wo Contrast    (Results Pending)    Medications - No data to display   Procedures  /  Critical Care .Critical Care Performed by: Maudie Flakes, MD Authorized by: Maudie Flakes, MD   Critical care provider statement:    Critical care time (minutes):  35   Critical care was necessary to treat or prevent imminent or life-threatening deterioration of the following conditions: Concern for acute ischemic stroke, initiation of code stroke protocol.    Critical care was time spent personally by me on the following activities:  Discussions with consultants, evaluation of patient's response to treatment, examination of patient, ordering and performing treatments and interventions, ordering and review of laboratory studies, ordering and review of radiographic studies, pulse oximetry, re-evaluation of patient's condition, obtaining history from patient or surrogate and review of old charts    ED Course and Medical Decision Making  I have reviewed the triage vital signs, the nursing notes, and pertinent available records from the EMR.  Listed above are laboratory and imaging tests that I personally ordered, reviewed, and interpreted and then considered in my medical decision making (see below for details).  Initial evaluation by EMS as patient with left arm weakness and outside of code stroke initiation window.  However given that patient is worsening in route now with slurred speech and left facial droop, code stroke initiated.     Evaluated by neurology, recommending MRI/MRA imaging and medicine admission.  Admitted to medicine for further care.  Barth Kirks. Sedonia Small, MD [place] [email or web address]  Final Clinical Impressions(s) / ED Diagnoses     ICD-10-CM   1. Left-sided weakness  R53.1   2. Speech disturbance, unspecified type  R47.9     ED Discharge Orders    None       Discharge Instructions Discussed with and Provided to Patient:   Discharge Instructions   None       Maudie Flakes, MD 04/25/20 [phone]

## 2020-04-25 NOTE — Evaluation (Signed)
Physical Therapy Evaluation Patient Details Name: Keith [name] MRN: [id number] DOB: 1965-12-14 Today's Date: 04/25/2020   History of Present Illness  Pt presents with L sided weakness UE and LE, facial droop, and slurred speech.  CT neg. MRI suspicious for CVA R pons. PMH - DM, CAD, HIV, rt BKA, CKD, back pain, HTN, diabetic neuropathy, STEMI s/p stent one mo ago.  Clinical Impression  Pt admitted with above diagnosis. Pt presents with L sided weakness and decreased awareness of deficits and safety. Pt reports that he lived with roommate and was independent with ADL's and driving. Pt easily agitated and kept stating that he couldn't lift his LLE but then once in sitting kept stating that he could walk and did not want any assist or anyone to touch him. PT/OT saw pt together to increase safety. Pt required close min-guard to stand and remained support with hands on bed. Pt side stepped along EOB with hands on bed but once to end of bed agreed to bilateral HHA and required mod A +2 to step due to heavy L lean and L knee buckling. Even after back to bed pt still thinking that he can walk on his own. Prosthesis removed to prevent this on his own.  Pt currently with functional limitations due to the deficits listed below (see PT Problem List). Pt will benefit from skilled PT to increase their independence and safety with mobility to allow discharge to the venue listed below.       Follow Up Recommendations SNF    Equipment Recommendations  Rolling walker with 5" wheels    Recommendations for Other Services       Precautions / Restrictions Precautions Precautions: Fall Required Braces or Orthoses: Other Brace Other Brace: R prosthesis in room Restrictions Weight Bearing Restrictions: No      Mobility  Bed Mobility Overal bed mobility: Needs Assistance Bed Mobility: Supine to Sit;Sit to Supine     Supine to sit: Min assist Sit to supine: Min assist   General bed mobility comments:  Min A for bringing LLE over EOB in transitioning to sit at EOB. Min A for elevating LLE over EOB for return to supine    Transfers Overall transfer level: Needs assistance Equipment used: None Transfers: Sit to/from Stand Sit to Stand: Min guard;+2 physical assistance         General transfer comment: Very close Min guard A for safety; pt leaning his BLEs against bed for support. Stating that he didn't want any help and no one to touch him but clearly struggling to get up and put full wt on LLE  Ambulation/Gait Ambulation/Gait assistance: Mod assist;+2 physical assistance Gait Distance (Feet): 10 Feet Assistive device: 2 person hand held assist Gait Pattern/deviations: Step-to pattern;Decreased weight shift to left;Decreased stance time - left Gait velocity: decreased Gait velocity interpretation: <1.31 ft/sec, indicative of household ambulator General Gait Details: pt refusing assist and leaning on BUE's on stretcher and side stepping towards foot of bed. However, when he got to end of bed he could not stand up and keep walking. Needed mod A +2 HHA with heavy lean to L and L knee buckling.  Stairs            Wheelchair Mobility    Modified Rankin (Stroke Patients Only) Modified Rankin (Stroke Patients Only) Pre-Morbid Rankin Score: No symptoms Modified Rankin: Moderately severe disability     Balance Overall balance assessment: Needs assistance Sitting-balance support: Feet supported;No upper extremity supported Sitting balance-Leahy Scale: Fair  Standing balance support: During functional activity;Bilateral upper extremity supported;No upper extremity supported Standing balance-Leahy Scale: Poor Standing balance comment: Reliant on UE support                             Pertinent Vitals/Pain Pain Assessment: Faces Faces Pain Scale: Hurts little more Pain Location: RLE Pain Descriptors / Indicators: Discomfort Pain Intervention(s): Limited  activity within patient's tolerance;Monitored during session    Home Living Family/patient expects to be discharged to:: Private residence Living Arrangements: Non-relatives/Friends Available Help at Discharge: Friend(s);Available 24 hours/day Type of Home: House Home Access: Stairs to enter Entrance Stairs-Rails: Psychiatric nurse of Steps: 3 Home Layout: One level Home Equipment: Shower seat      Prior Function Level of Independence: Independent         Comments: uses prosthetic     Hand Dominance   Dominant Hand: Right    Extremity/Trunk Assessment   Upper Extremity Assessment Upper Extremity Assessment: Defer to OT evaluation LUE Deficits / Details: Decreased strength and coorindation LUE Coordination: decreased gross motor    Lower Extremity Assessment Lower Extremity Assessment: LLE deficits/detail LLE Deficits / Details: hip flex 3-/5, knee ext 3/5, ankle 3/5, diffculty taking full body wt LLE in standing LLE Sensation: decreased proprioception LLE Coordination: decreased fine motor;decreased gross motor    Cervical / Trunk Assessment Cervical / Trunk Assessment: Normal  Communication   Communication: No difficulties  Cognition Arousal/Alertness: Awake/alert Behavior During Therapy: Flat affect Overall Cognitive Status: Impaired/Different from baseline Area of Impairment: Attention;Memory;Following commands;Safety/judgement;Awareness;Problem solving                   Current Attention Level: Sustained (Easily distracted by environment) Memory: Decreased short-term memory;Decreased recall of precautions Following Commands: Follows one step commands inconsistently;Follows one step commands with increased time Safety/Judgement: Decreased awareness of safety;Decreased awareness of deficits ([place] he cant move his L side and then saying he can walk without any help) Awareness: Intellectual Problem Solving: Slow processing;Requires  verbal cues General Comments: Upon arrival, pt presenting lethargic and mumbling but quickly becoming "more awake" when interested in topic. Pt initially self limiting and reporting he can't move with L side at all. Upon sitting at EOB, pt then becoming agitated/frustrated and stating he wanted to walk but doesn't want anyone to touch him. Pt with poor safety and awareness of deficits. Pt also benefiting from simple, direct cues and calm environment.      General Comments General comments (skin integrity, edema, etc.): VSS on RA. Pt donned prosthesis and L shoe but L foot not fully into shoe and pt unaware and stepping on back of shoe.    Exercises     Assessment/Plan    PT Assessment Patient needs continued PT services  PT Problem List Decreased strength;Decreased range of motion;Decreased activity tolerance;Decreased balance;Decreased mobility;Decreased coordination;Decreased cognition;Impaired sensation;Decreased knowledge of precautions;Decreased safety awareness;Decreased knowledge of use of DME       PT Treatment Interventions DME instruction;Gait training;Stair training;Functional mobility training;Therapeutic activities;Therapeutic exercise;Balance training;Cognitive remediation;Patient/family education;Neuromuscular re-education    PT Goals (Current goals can be found in the Care Plan section)  Acute Rehab PT Goals Patient Stated Goal: Lay here and rest PT Goal Formulation: With patient Time For Goal Achievement: 05/09/20 Potential to Achieve Goals: Good    Frequency Min 4X/week   Barriers to discharge Decreased caregiver support      Co-evaluation PT/OT/SLP Co-Evaluation/Treatment: Yes Reason for Co-Treatment: Complexity of the patient's impairments (  multi-system involvement);Necessary to address cognition/behavior during functional activity;For patient/therapist safety PT goals addressed during session: Mobility/safety with mobility;Balance         AM-PAC PT "6  Clicks" Mobility  Outcome Measure Help needed turning from your back to your side while in a flat bed without using bedrails?: A Little Help needed moving from lying on your back to sitting on the side of a flat bed without using bedrails?: None Help needed moving to and from a bed to a chair (including a wheelchair)?: A Lot Help needed standing up from a chair using your arms (e.g., wheelchair or bedside chair)?: A Lot Help needed to walk in hospital room?: A Lot Help needed climbing 3-5 steps with a railing? : Total 6 Click Score: 14    End of Session   Activity Tolerance: Treatment limited secondary to agitation Patient left: in bed;with call bell/phone within reach (prosthesis removed and pt told to not get up) Nurse Communication: Mobility status PT Visit Diagnosis: Unsteadiness on feet (R26.81);Hemiplegia and hemiparesis;Difficulty in walking, not elsewhere classified (R26.2) Hemiplegia - Right/Left: Left Hemiplegia - dominant/non-dominant: Non-dominant Hemiplegia - caused by: Cerebral infarction    Time: [id number] PT Time Calculation (min) (ACUTE ONLY): 27 min   Charges:   PT Evaluation $PT Eval Moderate Complexity: [place], [place]  Pager [phone] Office [place] 04/25/2020, 2:03 PM

## 2020-04-25 NOTE — ED Triage Notes (Signed)
Pt bib gems c/o of stroke like symptoms starting at midnight tonight. Pt c/o slurred speech, L sided weakness, and L sided facial droop. LKW 8pm 1/17. EMS LVO 2.   140/80 HR: 70  98% RA

## 2020-04-25 NOTE — Progress Notes (Signed)
STROKE TEAM PROGRESS NOTE   INTERVAL HISTORY Patient is in the ED. Lethargic. Interaction with insistence only. Presented with slurred speech and left-sided weakness. He has prior history of pontine stroke but MRI shows new area of acute ischemia adjacent to an old stroke. Patient states he not been compliant with his medications and his hemoglobin A1c was 15.5.  Vitals:   04/25/20 0745 04/25/20 0747 04/25/20 0800 04/25/20 1015  BP: (!) 138/100 (!) 138/100 (!) 153/110 133/79  Pulse:  73    Resp: '15 18 11 '$ (!) 21  Temp:  97.8 F (36.6 C)    TempSrc:  Oral    SpO2:  98%    Weight:       CBC:  Recent Labs  Lab 04/25/20 0126 04/25/20 0128 04/25/20 0619  WBC 7.0  --  7.5  NEUTROABS 3.0  --   --   HGB 11.1* 11.9* 10.6*  HCT 36.9* 35.0* 34.9*  MCV 86.6  --  86.8  PLT 272  --  0000000   Basic Metabolic Panel:  Recent Labs  Lab 04/25/20 0126 04/25/20 0128 04/25/20 0619  NA 136 140  --   K 4.0 4.1  --   CL 103 105  --   CO2 22  --   --   GLUCOSE 66* 65*  --   BUN 30* 32*  --   CREATININE 2.80* 2.90* 2.65*  CALCIUM 9.6  --   --    Lab Results  Component Value Date   CHOL 291 (H) 03/21/2020   HDL 30 (L) 03/21/2020   LDLCALC 199 (H) 03/21/2020   TRIG 310 (H) 03/21/2020   CHOLHDL 9.7 03/21/2020    Lab Results  Component Value Date   HGBA1C >15.5 (H) 03/24/2020   Urine Drug Screen:  Recent Labs  Lab 04/25/20 0755  LABOPIA NONE DETECTED  COCAINSCRNUR NONE DETECTED  LABBENZ NONE DETECTED  AMPHETMU NONE DETECTED  THCU NONE DETECTED  LABBARB NONE DETECTED     Alcohol Level  Recent Labs  Lab 04/25/20 0358  ETH <10    IMAGING past 24 hours MR ANGIO HEAD WO CONTRAST  Result Date: 04/25/2020 CLINICAL DATA:  55 year old male code stroke presentation. Left side weakness, slurred speech. EXAM: MRI HEAD WITHOUT CONTRAST MRA HEAD WITHOUT CONTRAST MRA NECK WITHOUT AND WITH CONTRAST TECHNIQUE: Multiplanar, multiecho pulse sequences of the brain and surrounding structures  were obtained without intravenous contrast. Angiographic images of the Circle of Willis were obtained using MRA technique without intravenous contrast. Angiographic images of the neck were obtained using MRA technique without and with intravenous contrast. Carotid stenosis measurements (when applicable) are obtained utilizing NASCET criteria, using the distal internal carotid diameter as the denominator. CONTRAST:  23m GADAVIST GADOBUTROL 1 MMOL/ML IV SOLN COMPARISON:  Plain head CT 0135 hours today, and earlier. FINDINGS: MRI HEAD FINDINGS Brain: Questionable punctate/vague diffusion restriction in the right pons (series 7, image 52). Nearby there is a chronic appearing right paracentral pontine lacunar infarct (series 10, image 65). Otherwise No restricted diffusion to suggest acute infarction. No midline shift, mass effect, evidence of mass lesion, ventriculomegaly, extra-axial collection or acute intracranial hemorrhage. Cervicomedullary junction and pituitary are within normal limits. Outside of the right pons gray and white matter signal is largely normal for age throughout the brain. No cortical encephalomalacia or chronic cerebral blood products identified. Vascular: Major intracranial vascular flow voids are preserved. Skull and upper cervical spine: Negative. Sinuses/Orbits: Postoperative changes to both globes, bilateral chronic lamina papyracea fractures. Otherwise negative.  Other: Mastoids are clear. Visible internal auditory structures appear normal. Visible scalp and face appear negative. MRA NECK FINDINGS Precontrast time-of-flight images demonstrate antegrade flow in both cervical carotid and vertebral arteries. The right vertebral artery appears slightly dominant. Carotid bifurcations appear within normal limits. Post-contrast neck MRA images reveal a 3 vessel arch configuration. Tortuous brachiocephalic artery. Normal right CCA aside from mild proximal tortuosity. Right carotid bifurcation is  widely patent. Minimal irregularity of the proximal right ICA. Otherwise negative cervical right ICA. Suggestion of mild to moderate supraclinoid ICA stenosis (series 1093, image 3). Patent right ICA terminus. Normal left CCA and left carotid bifurcation aside from some tortuosity. Cervical left ICA and visible left ICA siphon appear negative. Proximal subclavian arteries are patent without stenosis. Normal vertebral artery origins. Fairly codominant vertebral arteries are patent and within normal limits to the skull base. MRA HEAD FINDINGS Antegrade flow in the posterior circulation. Right V4 segment appears mildly dominant. Normal distal vertebral arteries and vertebrobasilar junction. Patent right PICA origin. The left AICA is patent and may be dominant. There is mild to moderate irregularity of the basilar artery although no significant stenosis (series 1053, image 11). Patent SCA and PCA origins. Posterior communicating arteries are diminutive or absent. Bilateral PCA branches are within normal limits. Antegrade flow in both ICA siphons. Normal left siphon. The right siphon appears normal to the supraclinoid segment where mild to moderate irregularity and mild stenosis is redemonstrated (series 1043, image 6). Patent bilateral carotid termini. Ophthalmic artery origins appear normal. Patent MCA and ACA origins. Diminutive anterior communicating artery. There is mild stenosis of the distal right ACA A2 segment. Visible left ACA branches are within normal limits. Left MCA M1 segment and trifurcation are patent without stenosis. Right MCA M1 segment and bifurcation are patent without stenosis. Visible bilateral MCA branches are within normal limits. IMPRESSION: 1. Suspicion of subtle acute small vessel ischemia in the Right Pons, with a nearby chronic right paracentral pontine lacune (see also #3). No associated hemorrhage or mass effect. 2. Otherwise normal for age non-contrast MRI appearance of the brain. 3. MRA  Head and Neck is positive for evidence of atherosclerosis in the: - mid Basilar Artery (mild stenosis), at the - distal Right ICA siphon (mild-to-moderate stenosis), and - Right ACA A2 segment (mild stenosis). No other posterior circulation stenosis. No cervical carotid stenosis. Electronically Signed   By: Genevie Ann M.D.   On: 04/25/2020 06:12   MR Angiogram Neck W or Wo Contrast  Result Date: 04/25/2020 CLINICAL DATA:  55 year old male code stroke presentation. Left side weakness, slurred speech. EXAM: MRI HEAD WITHOUT CONTRAST MRA HEAD WITHOUT CONTRAST MRA NECK WITHOUT AND WITH CONTRAST TECHNIQUE: Multiplanar, multiecho pulse sequences of the brain and surrounding structures were obtained without intravenous contrast. Angiographic images of the Circle of Willis were obtained using MRA technique without intravenous contrast. Angiographic images of the neck were obtained using MRA technique without and with intravenous contrast. Carotid stenosis measurements (when applicable) are obtained utilizing NASCET criteria, using the distal internal carotid diameter as the denominator. CONTRAST:  77m GADAVIST GADOBUTROL 1 MMOL/ML IV SOLN COMPARISON:  Plain head CT 0135 hours today, and earlier. FINDINGS: MRI HEAD FINDINGS Brain: Questionable punctate/vague diffusion restriction in the right pons (series 7, image 52). Nearby there is a chronic appearing right paracentral pontine lacunar infarct (series 10, image 65). Otherwise No restricted diffusion to suggest acute infarction. No midline shift, mass effect, evidence of mass lesion, ventriculomegaly, extra-axial collection or acute intracranial hemorrhage. Cervicomedullary junction  and pituitary are within normal limits. Outside of the right pons gray and white matter signal is largely normal for age throughout the brain. No cortical encephalomalacia or chronic cerebral blood products identified. Vascular: Major intracranial vascular flow voids are preserved. Skull and  upper cervical spine: Negative. Sinuses/Orbits: Postoperative changes to both globes, bilateral chronic lamina papyracea fractures. Otherwise negative. Other: Mastoids are clear. Visible internal auditory structures appear normal. Visible scalp and face appear negative. MRA NECK FINDINGS Precontrast time-of-flight images demonstrate antegrade flow in both cervical carotid and vertebral arteries. The right vertebral artery appears slightly dominant. Carotid bifurcations appear within normal limits. Post-contrast neck MRA images reveal a 3 vessel arch configuration. Tortuous brachiocephalic artery. Normal right CCA aside from mild proximal tortuosity. Right carotid bifurcation is widely patent. Minimal irregularity of the proximal right ICA. Otherwise negative cervical right ICA. Suggestion of mild to moderate supraclinoid ICA stenosis (series 1093, image 3). Patent right ICA terminus. Normal left CCA and left carotid bifurcation aside from some tortuosity. Cervical left ICA and visible left ICA siphon appear negative. Proximal subclavian arteries are patent without stenosis. Normal vertebral artery origins. Fairly codominant vertebral arteries are patent and within normal limits to the skull base. MRA HEAD FINDINGS Antegrade flow in the posterior circulation. Right V4 segment appears mildly dominant. Normal distal vertebral arteries and vertebrobasilar junction. Patent right PICA origin. The left AICA is patent and may be dominant. There is mild to moderate irregularity of the basilar artery although no significant stenosis (series 1053, image 11). Patent SCA and PCA origins. Posterior communicating arteries are diminutive or absent. Bilateral PCA branches are within normal limits. Antegrade flow in both ICA siphons. Normal left siphon. The right siphon appears normal to the supraclinoid segment where mild to moderate irregularity and mild stenosis is redemonstrated (series 1043, image 6). Patent bilateral carotid  termini. Ophthalmic artery origins appear normal. Patent MCA and ACA origins. Diminutive anterior communicating artery. There is mild stenosis of the distal right ACA A2 segment. Visible left ACA branches are within normal limits. Left MCA M1 segment and trifurcation are patent without stenosis. Right MCA M1 segment and bifurcation are patent without stenosis. Visible bilateral MCA branches are within normal limits. IMPRESSION: 1. Suspicion of subtle acute small vessel ischemia in the Right Pons, with a nearby chronic right paracentral pontine lacune (see also #3). No associated hemorrhage or mass effect. 2. Otherwise normal for age non-contrast MRI appearance of the brain. 3. MRA Head and Neck is positive for evidence of atherosclerosis in the: - mid Basilar Artery (mild stenosis), at the - distal Right ICA siphon (mild-to-moderate stenosis), and - Right ACA A2 segment (mild stenosis). No other posterior circulation stenosis. No cervical carotid stenosis. Electronically Signed   By: Genevie Ann M.D.   On: 04/25/2020 06:12   MR BRAIN WO CONTRAST  Result Date: 04/25/2020 CLINICAL DATA:  55 year old male code stroke presentation. Left side weakness, slurred speech. EXAM: MRI HEAD WITHOUT CONTRAST MRA HEAD WITHOUT CONTRAST MRA NECK WITHOUT AND WITH CONTRAST TECHNIQUE: Multiplanar, multiecho pulse sequences of the brain and surrounding structures were obtained without intravenous contrast. Angiographic images of the Circle of Willis were obtained using MRA technique without intravenous contrast. Angiographic images of the neck were obtained using MRA technique without and with intravenous contrast. Carotid stenosis measurements (when applicable) are obtained utilizing NASCET criteria, using the distal internal carotid diameter as the denominator. CONTRAST:  50m GADAVIST GADOBUTROL 1 MMOL/ML IV SOLN COMPARISON:  Plain head CT 0135 hours today, and earlier. FINDINGS:  MRI HEAD FINDINGS Brain: Questionable punctate/vague  diffusion restriction in the right pons (series 7, image 52). Nearby there is a chronic appearing right paracentral pontine lacunar infarct (series 10, image 65). Otherwise No restricted diffusion to suggest acute infarction. No midline shift, mass effect, evidence of mass lesion, ventriculomegaly, extra-axial collection or acute intracranial hemorrhage. Cervicomedullary junction and pituitary are within normal limits. Outside of the right pons gray and white matter signal is largely normal for age throughout the brain. No cortical encephalomalacia or chronic cerebral blood products identified. Vascular: Major intracranial vascular flow voids are preserved. Skull and upper cervical spine: Negative. Sinuses/Orbits: Postoperative changes to both globes, bilateral chronic lamina papyracea fractures. Otherwise negative. Other: Mastoids are clear. Visible internal auditory structures appear normal. Visible scalp and face appear negative. MRA NECK FINDINGS Precontrast time-of-flight images demonstrate antegrade flow in both cervical carotid and vertebral arteries. The right vertebral artery appears slightly dominant. Carotid bifurcations appear within normal limits. Post-contrast neck MRA images reveal a 3 vessel arch configuration. Tortuous brachiocephalic artery. Normal right CCA aside from mild proximal tortuosity. Right carotid bifurcation is widely patent. Minimal irregularity of the proximal right ICA. Otherwise negative cervical right ICA. Suggestion of mild to moderate supraclinoid ICA stenosis (series 1093, image 3). Patent right ICA terminus. Normal left CCA and left carotid bifurcation aside from some tortuosity. Cervical left ICA and visible left ICA siphon appear negative. Proximal subclavian arteries are patent without stenosis. Normal vertebral artery origins. Fairly codominant vertebral arteries are patent and within normal limits to the skull base. MRA HEAD FINDINGS Antegrade flow in the posterior  circulation. Right V4 segment appears mildly dominant. Normal distal vertebral arteries and vertebrobasilar junction. Patent right PICA origin. The left AICA is patent and may be dominant. There is mild to moderate irregularity of the basilar artery although no significant stenosis (series 1053, image 11). Patent SCA and PCA origins. Posterior communicating arteries are diminutive or absent. Bilateral PCA branches are within normal limits. Antegrade flow in both ICA siphons. Normal left siphon. The right siphon appears normal to the supraclinoid segment where mild to moderate irregularity and mild stenosis is redemonstrated (series 1043, image 6). Patent bilateral carotid termini. Ophthalmic artery origins appear normal. Patent MCA and ACA origins. Diminutive anterior communicating artery. There is mild stenosis of the distal right ACA A2 segment. Visible left ACA branches are within normal limits. Left MCA M1 segment and trifurcation are patent without stenosis. Right MCA M1 segment and bifurcation are patent without stenosis. Visible bilateral MCA branches are within normal limits. IMPRESSION: 1. Suspicion of subtle acute small vessel ischemia in the Right Pons, with a nearby chronic right paracentral pontine lacune (see also #3). No associated hemorrhage or mass effect. 2. Otherwise normal for age non-contrast MRI appearance of the brain. 3. MRA Head and Neck is positive for evidence of atherosclerosis in the: - mid Basilar Artery (mild stenosis), at the - distal Right ICA siphon (mild-to-moderate stenosis), and - Right ACA A2 segment (mild stenosis). No other posterior circulation stenosis. No cervical carotid stenosis. Electronically Signed   By: Genevie Ann M.D.   On: 04/25/2020 06:12   CT HEAD CODE STROKE WO CONTRAST  Result Date: 04/25/2020 CLINICAL DATA:  Code stroke. Initial evaluation for slurred speech, left-sided weakness. [place] side EXAM: CT HEAD WITHOUT CONTRAST TECHNIQUE: Contiguous axial images were  obtained from the base of the skull through the vertex without intravenous contrast. COMPARISON:  Prior CT from 10/11/2018. FINDINGS: Brain: Cerebral volume within normal limits. No acute intracranial  hemorrhage. No acute large vessel territory infarct. No mass lesion, midline shift or mass effect. No hydrocephalus or extra-axial fluid collection. Vascular: No hyperdense vessel. Skull: Scalp soft tissues and calvarium within normal limits. Sinuses/Orbits: Globes and orbital soft tissues demonstrate no acute finding. Probable remote fractures noted involving the bilateral lamina papyracea. Mild mucosal thickening noted about the maxillary sinuses. Paranasal sinuses are otherwise clear. No mastoid effusion. Other: None. ASPECTS [facility] Stroke Program Early CT Score) - Ganglionic level infarction (caudate, lentiform nuclei, internal capsule, insula, M1-M3 cortex): 7 - Supraganglionic infarction (M4-M6 cortex): 3 Total score (0-10 with 10 being normal): 10 IMPRESSION: 1. No acute intracranial infarct or other abnormality. 2. ASPECTS is 10. These results were communicated to Dr. Theda Sers at [place] 1/18/2022by text page via the [facility] messaging system. Electronically Signed   By: Jeannine Boga M.D.   On: 04/25/2020 01:57    PHYSICAL EXAM Obese middle-aged African-American male not in distress. . Afebrile. Head is nontraumatic. Neck is supple without bruit.    Cardiac exam no murmur or gallop. Lungs are clear to auscultation. Distal pulses are well felt. Neurological Exam : Is drowsy but can be aroused with some difficulty. He is oriented to time place and person. Mild dysarthria no aphasia. Follows commands well. Extraocular movements are full range without nystagmus. Face is symmetric without weakness. Tongue is midline. Motor system exam shows no upper or lower extremity drift. Slight diminished fine finger movements on the left and orbits right over left upper extremity. No drift. Sensation intact  bilaterally. Reflexes are symmetric. Plantars downgoing. Gait not tested. ASSESSMENT/PLAN Keith [name] is a 55 y.o. male with history of HIV, HTN, CAD/MI s/p stent, DM 2 with vascular and neurologic complications presenting with left sided weakness and slurred speech. Possible metabolic and functional etiology.  Stroke:   Small R pontine infarct secondary to small vessel disease source  Code Stroke CT head No acute abnormality. ASPECTS 10.     MRI  Possible small R pontine infarct. Old R paracentral pontine lacune.   MRA head & neck   Atherosclerosis mild mid BA, mild to moderate distal R ICA siphon and mild R A2 stenoses  2D Echo w/ bubble pending   LDL 199 03/21/2020, pending   HgbA1c >15.5 03/24/2020  VTE prophylaxis - Lovenox 40 mg sq daily   aspirin 81 mg daily and Brilinta (ticagrelor) 90 mg bid prior to admission for cardiac revascularization in 03/2020, now on aspirin 81 mg daily and Brilinta (ticagrelor) 90 mg bid. Continue DAPT at d/c.   Therapy recommendations:  pending   Disposition:  pending   Hypertension  Stable on the high side . Permissive hypertension (OK if < 220/120) but gradually normalize in 5-7 days . Long-term BP goal normotensive  Hyperlipidemia  Home meds:  Lipitor 80, resumed in hospital  LDL 199 03/21/2020, goal < 70  Continue statin at discharge  Diabetes type II Uncontrolled  HgbA1c >15.5 03/24/2020, goal < 7.0  CBGs Recent Labs    04/25/20 0923 04/25/20 0931 04/25/20 1017  GLUCAP 504* 371* 386*      SSI  Other Stroke Risk Factors  Hx ETOH use, alcohol level <10, advised to drink no more than 2 drink(s) a day  Hx drug use  Obesity, Body mass index is 38.4 kg/m., BMI >/= 30 associated with increased stroke risk, recommend weight loss, diet and exercise as appropriate   Coronary artery disease s/p MI w/ recent stent on aspirin and Brilinta, BB 03/2020  HIV on Biktarvy and dapsone.  R BKA  Other Active  Problems  CKD stage IV w anemia of chronic dz  Hx chronic pain  Hospital day # 0 Patient presented with some slurred speech and mild right-sided left-sided weakness secondary to right pontine lacunar infarct. He is already on dual antiplatelet therapy of aspirin and Brilinta for his cardiac cardiac stent and so we will continue it. Patient counseled to be compliant with medications and the need for aggressive risk factor modification to prevent any more disabling strokes in the future. Long discussion with patient and answered questions. Discussed with Dr. Pietro Cassis. Greater than 50% time during this 35-minute visit was spent on counseling and coordination of care about lacunar stroke and discussion about prevention and treatment and answering questions. Antony Contras, MD To contact Stroke Continuity provider, please refer to [email or web address] After hours, contact General Neurology

## 2020-04-25 NOTE — Progress Notes (Signed)
PROGRESS NOTE  Keith [name]  DOB: [birth year]  PCP: [name], FNP POE:[id number]  DOA: 04/25/2020  LOS: 0 days   Chief complaint: Weakness  Brief narrative: Keith [name] is a 55 y.o. male with PMH significant for DM2, CKD 4, CAD status post recent stenting last month for ST elevation MI with history of diabetes mellitus type 2, right BKA, chronic anemia. Patient presented to the ED on 1/18 was brought to the ER after patient had left-sided weakness.  Patient states he took his insulin last night long-acting 55 units along with his Premeal insulin following which he did not eat.  He started feeling weak on the left upper and lower extremity followed by left facial droop and slurred speech at around 8 PM.  Did not have any weakness on the right side.  In the ER, patient was evaluated by neurologist CT head was unremarkable.  Blood sugars were in the 60s.  Patient passed swallow evaluation.  Labs are largely unremarkable and well at baseline.   MRI brain showed possible acute small vessel ischemia of the right pons with a nearby chronic right paracentral pontine lacunae.  No large vessel occlusion.  But there is evidence of atherosclerosis in the mid basilar artery, distal right ICA and right ACA A2.  Patient was admitted to hospitalist service. Neurology follow-up was obtained.  Subjective: Patient was seen and examined this morning. Middle-aged African-American male.  Lying on bed.  Not in distress.  No new symptoms. Complains of headache.  Assessment/Plan: Acute stroke -Presented with left-sided weakness.  MRI finding with subtle small vessel ischemia of the right pons. -Stroke work-up ordered.  Neurology following up. -Currently on aspirin, Brilinta and statin.  Uncontrolled diabetes mellitus -A1c more than 15.5. -Takes Levemir 55 units at home.  Patient stated that he at home he felt hypoglycemic symptoms.  Blood sugar trend overnight showed that his blood sugar level  has been persistently elevated. -Resume Levemir at a lower dose of 35 units this morning. -Sliding scale insulin with Accu-Cheks. Recent Labs  Lab 04/25/20 0923 04/25/20 0931 04/25/20 1017 04/25/20 1117 04/25/20 1228  GLUCAP 504* 371* 386* 371* 400*   CAD status post recent stenting -Continue aspirin, Brilinta and statin.   -No chest symptoms.  Essential hypertension -Home meds include amlodipine 10 mg daily, Coreg 25 mg twice daily, -Continue the same.  CKD stage IV -Creatinine remains at baseline. Recent Labs    03/21/20 2232 03/22/20 0312 03/23/20 0213 03/24/20 0211 03/25/20 0152 03/26/20 0116 03/27/20 0327 03/28/20 0207 04/25/20 0126 04/25/20 0128 04/25/20 0619  BUN 30* 29* 38* 44* 41* 38* 33* 33* 30* 32*  --   CREATININE 2.33* 2.29* 3.87* 4.40* 4.29* 3.31* 2.98* 2.96* 2.80* 2.90* 2.65*   HIV -continue Biktarvy and dapsone.  Chronic anemia -Hemoglobin stable. Recent Labs    03/24/20 0211 03/27/20 0327 04/25/20 0126 04/25/20 0128 04/25/20 0619  HGB 10.6* 10.9* 11.1* 11.9* 10.6*  MCV 80.5 83.0 86.6  --  86.8   Peripheral artery disease Right BKA status -On aspirin, Brilinta and statin  History of chronic pain -Continue pain meds.   Current Outpatient Medications  Medication Instructions  . amLODipine (NORVASC) 10 mg, Oral, Daily  . aspirin 81 mg, Oral, Daily  . atorvastatin (LIPITOR) 80 mg, Oral, Daily-1800  . BIKTARVY 50-200-25 MG TABS tablet TAKE 1 TABLET BY MOUTH DAILY.  . blood glucose meter kit and supplies Dispense based on patient and insurance preference. Use up to four times daily as directed. (  FOR ICD-10 E10.9, E11.9).  . carvedilol (COREG) 25 mg, Oral, 2 times daily with meals, Please make appt for future refills. [phone]. 1st attempt.  . dapsone 100 mg, Oral, Daily  . EASY COMFORT PEN NEEDLES 31G X 5 MM MISC INJECT 20 UNITS DAILY AS DIRECTED.  Marland Kitchen gabapentin (NEURONTIN) 100 mg, Oral, 3 times daily  . glucose blood (COOL BLOOD  GLUCOSE TEST STRIPS) test strip Use as instructed  . icosapent Ethyl (VASCEPA) 2 g, Oral, 2 times daily  . insulin aspart (NOVOLOG FLEXPEN) 100 UNIT/ML FlexPen Inject 10 units under skin 3 times a day, with meals.  . Insulin Syringes, Disposable, U-100 0.3 ML MISC 1 Syringe, Does not apply, 3 times daily  . Lancets (ONETOUCH ULTRASOFT) lancets Use as instructed  . Levemir 55 Units, Subcutaneous, Daily  . nitroGLYCERIN (NITROSTAT) 0.4 mg, Sublingual, Every 5 min x3 PRN  . oxybutynin (DITROPAN) 5 mg, Oral, 2 times daily  . Oxycodone HCl 10 mg, Oral, Daily PRN  . ticagrelor (BRILINTA) 90 mg, Oral, 2 times daily      Mobility: Encourage ambulation.  PT eval as part of stroke work-up Code Status:   Code Status: Full Code  Nutritional status: Body mass index is 38.4 kg/m.     Diet Order            Diet heart healthy/carb modified Room service appropriate? Yes; Fluid consistency: Thin  Diet effective now                 DVT prophylaxis: enoxaparin (LOVENOX) injection 40 mg Start: 04/25/20 1000   Antimicrobials:  None Fluid: None Consultants: Neurology Family Communication:  Done at bedside  Status is: Observation  Dispo: The patient is from: Home              Anticipated d/c is to: Home hopefully              Anticipated d/c date is: 2 days              Patient currently is not medically stable to d/c.       Infusions:    Scheduled Meds: .  stroke: mapping our early stages of recovery book   Does not apply Once  . amLODipine  10 mg Oral Daily  . aspirin EC  81 mg Oral Daily  . atorvastatin  80 mg Oral q1800  . bictegravir-emtricitabine-tenofovir AF  1 tablet Oral Daily  . carvedilol  25 mg Oral BID WC  . dapsone  100 mg Oral Daily  . enoxaparin (LOVENOX) injection  40 mg Subcutaneous Q24H  . gabapentin  100 mg Oral TID  . icosapent Ethyl  2 g Oral BID  . insulin aspart  0-5 Units Subcutaneous QHS  . insulin aspart  0-9 Units Subcutaneous TID WC  . insulin  detemir  35 Units Subcutaneous Daily  . oxybutynin  5 mg Oral BID  . ticagrelor  90 mg Oral BID    Antimicrobials: Anti-infectives (From admission, onward)   Start     Dose/Rate Route Frequency Ordered Stop   04/25/20 1000  bictegravir-emtricitabine-tenofovir AF (BIKTARVY) 50-200-25 MG per tablet 1 tablet        1 tablet Oral Daily 04/25/20 0456     04/25/20 1000  dapsone tablet 100 mg        100 mg Oral Daily 04/25/20 0456        PRN meds: acetaminophen **OR** acetaminophen (TYLENOL) oral liquid 160 mg/5 mL **OR** acetaminophen, nitroGLYCERIN, oxyCODONE  Objective: Vitals:   04/25/20 1030 04/25/20 1045  BP: (!) 143/104 (!) 148/103  Pulse: 74 75  Resp: 12 14  Temp:    SpO2:  98%    Intake/Output Summary (Last 24 hours) at 04/25/2020 1302 Last data filed at 04/25/2020 0756 Gross per 24 hour  Intake -  Output 400 ml  Net -400 ml   Filed Weights   04/25/20 0132 04/25/20 0134  Weight: 117.9 kg 117.9 kg   Weight change:  Body mass index is 38.4 kg/m.   Physical Exam: General exam: Pleasant middle-aged African-American male.  Not in distress Skin: No rashes, lesions or ulcers. HEENT: Atraumatic, normocephalic, no obvious bleeding Lungs: Clear to auscultation bilaterally CVS: Regular rate and rhythm, no murmur GI/Abd soft, nontender, nondistended, bowel sound present CNS: Alert, awake monitor x3 Psychiatry: Mood appropriate Extremities: Right BKA status  Data Review: I have personally reviewed the laboratory data and studies available.  Recent Labs  Lab 04/25/20 0126 04/25/20 0128 04/25/20 0619  WBC 7.0  --  7.5  NEUTROABS 3.0  --   --   HGB 11.1* 11.9* 10.6*  HCT 36.9* 35.0* 34.9*  MCV 86.6  --  86.8  PLT 272  --  246   Recent Labs  Lab 04/25/20 0126 04/25/20 0128 04/25/20 0619  NA 136 140  --   K 4.0 4.1  --   CL 103 105  --   CO2 22  --   --   GLUCOSE 66* 65*  --   BUN 30* 32*  --   CREATININE 2.80* 2.90* 2.65*  CALCIUM 9.6  --   --      F/u labs ordered  Signed, Terrilee Croak, MD [facility] 04/25/2020

## 2020-04-25 NOTE — Consult Note (Addendum)
Neurology Consult H&P  CC: code stroke  History is obtained from: Patient, chart, EMS  HPI: Keith [name] is a 55 y.o. male right handed PMHx as reviewed below, HIV, HTN, CAD/MI s/p stent, DM 2 with vascular and neurologic complications presents with left sided weakness and slurred speech.   Sates that he took his insulin injection before he had any food in his system.  He states that he customarily injects his insulin when he feels weak which is what he did today without any food in his system.   KW: 2000 tpa given?: No, mild IR Thrombectomy? No, LVO Modified Rankin Scale: 0-Completely asymptomatic and back to baseline post- stroke NIHSS: ~1  ROS: A complete ROS was performed and is negative except as noted in the HPI.   Past Medical History:  Diagnosis Date  . Abscess of right foot    abscess/ulcer of R transtibial amputation requiring IV abx  . AIDS ([place])   . Anemia   . CAD (coronary artery disease)    a. MI with stenting of OM1 in 11/2018 with residual disease. b. acute STEMI 03/2020 s/p DES to OM1  . Chronic anemia   . Chronic knee pain    right  . Chronic pain   . CKD (chronic kidney disease), stage IV ([place])   . Diabetes type 2, uncontrolled ([place])    HgA1c 17.6 (04/27/2010)  . Diabetic foot ulcer ([place]) 01/2017   right foot  . Dilatation of aorta ([facility])   . Erectile dysfunction   . Genital warts   . HIV (human immunodeficiency virus infection) ([place]) 2009   CD4 count 100, VL 13800 (05/01/2010)  . Hyperlipidemia   . Hypertension   . Noncompliance with medication regimen   . Osteomyelitis ([place])    h/o hand  . Osteomyelitis of metatarsal ([place]) 04/28/2017    Family History  Problem Relation Age of Onset  . Hypertension Mother   . Arthritis Father   . Hypertension Father   . Hypertension Brother   . Cancer Maternal Grandmother 68       unknown type of cancer  . Depression Paternal Grandmother     Social History:  reports that he has never smoked. He has never  used smokeless tobacco. He reports previous alcohol use. He reports previous drug use.   Prior to Admission medications   Medication Sig Start Date End Date Taking? Authorizing Provider  amLODipine (NORVASC) 10 MG tablet Take 1 tablet (10 mg total) by mouth daily. 03/27/20   [name], MD  aspirin 81 MG EC tablet Take 1 tablet (81 mg total) by mouth daily. 03/27/20   [name], MD  atorvastatin (LIPITOR) 80 MG tablet Take 1 tablet (80 mg total) by mouth daily at 6 PM. 03/27/20   [name], MD  BIKTARVY 50-200-25 MG TABS tablet TAKE 1 TABLET BY MOUTH DAILY. Patient taking differently: Take 1 tablet by mouth daily. 03/20/20   [name], MD  blood glucose meter kit and supplies Dispense based on patient and insurance preference. Use up to four times daily as directed. (FOR ICD-10 E10.9, E11.9). 04/07/20   [name], FNP  carvedilol (COREG) 25 MG tablet Take 1 tablet (25 mg total) by mouth 2 (two) times daily with a meal. Please make appt for future refills. [phone]. 1st attempt. 03/27/20   [name], MD  dapsone 100 MG tablet Take 1 tablet (100 mg total) by mouth daily. 03/28/20   [name], Ava, DO  EASY COMFORT PEN NEEDLES 31G X 5 MM MISC INJECT 20 UNITS DAILY AS DIRECTED. 11/10/19   [name], FNP  gabapentin (NEURONTIN) 100 MG capsule Take 1 capsule (100 mg total) by mouth 3 (three) times daily. 03/28/20   [name], Ava, DO  glucose blood (COOL BLOOD GLUCOSE TEST STRIPS) test strip Use as instructed 02/16/20   [name], FNP  icosapent Ethyl (VASCEPA) 1 g capsule Take 2 capsules (2 g total) by mouth 2 (two) times daily. 03/27/20   [name], MD  insulin aspart (NOVOLOG FLEXPEN) 100 UNIT/ML FlexPen Inject 10 units under skin 3 times a day, with meals. 03/27/20   [name], Ava, DO  Insulin Syringes, Disposable, U-100 0.3 ML MISC 1 Syringe by Does not apply route 3 (three) times daily. 02/16/20   [name], FNP  Lancets  [facility] ULTRASOFT) lancets Use as instructed 02/16/20   [name], FNP  LEVEMIR 100 UNIT/ML injection Inject 0.55 mLs (55 Units total) into the skin daily. 03/28/20 11/18/21  [name], Ava, DO  nitroGLYCERIN (NITROSTAT) 0.4 MG SL tablet Place 1 tablet (0.4 mg total) under the tongue every 5 (five) minutes x 3 doses as needed for chest pain. 03/27/20   [name], Ava, DO  oxybutynin (DITROPAN) 5 MG tablet Take 1 tablet (5 mg total) by mouth 2 (two) times daily. 03/27/20   [name], MD  oxyCODONE 10 MG TABS Take 1 tablet (10 mg total) by mouth daily as needed for up to 5 doses for severe pain or breakthrough pain (pain). 11/03/18   Kayleen Memos, DO  silver sulfADIAZINE (SILVADENE) 1 % cream Apply 1 application topically daily. Apply to wound daily Patient not taking: Reported on 03/22/2020 11/06/18   [name], MD  ticagrelor (BRILINTA) 90 MG TABS tablet Take 1 tablet (90 mg total) by mouth 2 (two) times daily. 03/27/20   [name], Ava, DO    Exam: Current vital signs: BP 132/81   Pulse 68   Resp 16   Wt 117.9 kg   SpO2 100%   BMI 38.40 kg/m   Physical Exam  Constitutional: Appears well-developed and well-nourished.  Psych: Affect appropriate to situation Eyes: No scleral injection HENT: No OP obstruction, edentulous. Head: Normocephalic.  Cardiovascular: Normal rate and regular rhythm.  Respiratory: Effort normal, symmetric excursions bilaterally, no audible wheezing. GI: Soft.  No distension. There is no tenderness.  Skin: WDI  Neuro: Mental Status: Patient is awake, alert, oriented to person, place, month, year, and situation. Patient is able to give a clear and coherent history. No signs of aphasia or neglect. Cranial Nerves: II: Visual Fields are full. Pupils are equal, round, and reactive to light. III,IV, VI: EOMI without ptosis or diploplia.  V: Facial sensation is symmetric to temperature VII: Facial movement is symmetric.  VIII: hearing is intact to  voice X: Uvula elevates symmetrically XI: Shoulder shrug is symmetric. XII: tongue is midline without atrophy or fasciculations.  Motor: Tone is normal. Bulk is normal. Initially [place] (+) however exam fluctuated with good strength present in all four extremities.  Sensory: Sensation is symmetric to light touch and temperature in the arms and legs. Deep Tendon Reflexes: 2+ and symmetric in the biceps and patellae. Plantars: Toes are downgoing bilaterally. Cerebellar: FNF and HKS are intact bilaterally.  I have reviewed labs in epic and the pertinent results are: Results for Keith [name], Keith [name] (MRN [id number]) as of 04/25/2020 02:13  Ref. Range 04/07/2020 08:54  POC Glucose Latest Ref  Range: 70 - 99 mg/dl 334 (A)    I have reviewed the images obtained: NCT head showed no acute ischemic changes. ASPECTS 10.  Assessment: Keith [name] is a 55 y.o. male right handed PMHx HIV, HTN, CAD/MI s/p stent, DM 2 with vascular and neurologic complications evaluated for left sided weakness and speech disturbance. His speech is fluent, comprehension and repetition are intact. Sensation was intact and strength initially fluctuated and eventually became consistently strong. He passed the bedside swallow exam, then complained of being hungry and requested a [place] sandwich which. Stroke remains in the differential diagnosis however cannot exclude metabolic and functional etiology.  Impression:  Stroke-like symptoms. Hypoglycemia. Metabolic derangement NIHSS 1 Fluctuating neurologic exam  Plan: - Aspirin 369m now and continue 871mdaily. - MRI brain without contrast. - MRA head and neck. - Continue statin and may need adjustment if LDL>70. - TTE. - Labs: HbA1c, lipid panel, TSH. - Consider clopidogrel 7553maily for 3 weeks. - Permissive hypertension first 24 h < 220/110.  - Telemetry monitoring for arrhythmia. - Recommend Stroke education. - Recommend PT/OT/SLP consult.   Electronically  signed by:  HunLynnae SandhoffD Page: [id number]/2022, 2:11 AM

## 2020-04-25 NOTE — ED Notes (Signed)
Tele  Breakfast Ordered 

## 2020-04-25 NOTE — Evaluation (Addendum)
Occupational Therapy Evaluation Patient Details Name: Keith [name] MRN: [name] DOB: Sep 02, 1965 Today's Date: 04/25/2020    History of Present Illness Pt presents with L sided weakness UE and LE, facial droop, and slurred speech.  CT neg. MRI suspicious for CVA R pons. PMH - DM, CAD, HIV, rt BKA, CKD, back pain, HTN, diabetic neuropathy, STEMI s/p stent one mo ago.   Clinical Impression   PTA, pt reports he was living with a roommate and was independent without using any DME; has R prosthetic. Pt currently requiring Mod A +2 for LB ADLs and mobility. Pt presenting with poor cognition, safety, awareness, balance, strength, and functional use of LUE/LLE. Pt with self limiting behavior while supine and declining to perform MMT repeating "I can't move my leg" despite then being able to move (though weak). Upon cues to sit at EOB, pt then becoming adamant that he can walk and wanted to walk in hallway stating "but I don't want anyone grabbing me" despite weakness at LLE and prosthetic at RLE. Pt benefiting from direct, simple cues and calm environment. Pt would benefit from further acute OT to facilitate safe dc. Recommend dc to SNF for further OT to optimize safety, independence with ADLs, and return to PLOF.     Follow Up Recommendations  SNF    Equipment Recommendations  Other (comment) (RW)    Recommendations for Other Services PT consult     Precautions / Restrictions Precautions Precautions: Fall      Mobility Bed Mobility Overal bed mobility: Needs Assistance Bed Mobility: Supine to Sit;Sit to Supine     Supine to sit: Min assist Sit to supine: Min assist   General bed mobility comments: Min A for bringing LLE over EOB in transitioning to sit at EOB. Min A for elevating LLE over EOB for return to supine    Transfers Overall transfer level: Needs assistance Equipment used: None Transfers: Sit to/from Stand Sit to Stand: Min guard;+2 physical assistance          General transfer comment: Very close Min guard A for safety; pt leaning his BLEs against bed for support    Balance Overall balance assessment: Needs assistance Sitting-balance support: Feet supported;No upper extremity supported Sitting balance-Leahy Scale: Fair     Standing balance support: During functional activity;Bilateral upper extremity supported;No upper extremity supported Standing balance-Leahy Scale: Poor Standing balance comment: Reliant on UE support                           ADL either performed or assessed with clinical judgement   ADL Overall ADL's : Needs assistance/impaired Eating/Feeding: Set up;Sitting   Grooming: Set up;Supervision/safety;Sitting   Upper Body Bathing: Set up;Supervision/ safety;Sitting   Lower Body Bathing: +2 for safety/equipment;Sit to/from stand;Moderate assistance   Upper Body Dressing : Set up;Supervision/safety;Sitting   Lower Body Dressing: Sit to/from stand;Moderate assistance Lower Body Dressing Details (indicate cue type and reason): Pt donning his prothetic, Mod A +2 for balance in standing Toilet Transfer: Moderate assistance;+2 for physical assistance;Ambulation (simulated in room)           Functional mobility during ADLs: Moderate assistance;+2 for physical assistance General ADL Comments: Pt presenting with poor awareness, cognition, functional use of LUE/LLE, balance, and strength.     Vision Baseline Vision/History: No visual deficits       Perception     Praxis      Pertinent Vitals/Pain Pain Assessment: Faces Faces Pain Scale: Hurts little  more Pain Location: RLE Pain Descriptors / Indicators: Discomfort Pain Intervention(s): Monitored during session;Limited activity within patient's tolerance;Repositioned     Hand Dominance Right   Extremity/Trunk Assessment Upper Extremity Assessment Upper Extremity Assessment: LUE deficits/detail LUE Deficits / Details: Decreased strength and  coorindation LUE Coordination: decreased gross motor   Lower Extremity Assessment Lower Extremity Assessment: Defer to PT evaluation   Cervical / Trunk Assessment Cervical / Trunk Assessment: Normal   Communication Communication Communication: No difficulties   Cognition Arousal/Alertness: Awake/alert Behavior During Therapy: Flat affect Overall Cognitive Status: Impaired/Different from baseline Area of Impairment: Attention;Memory;Following commands;Safety/judgement;Awareness;Problem solving                   Current Attention Level: Sustained (Easily distracted by environment) Memory: Decreased short-term memory;Decreased recall of precautions Following Commands: Follows one step commands inconsistently;Follows one step commands with increased time Safety/Judgement: Decreased awareness of safety;Decreased awareness of deficits (Adamant he cant move his L side and then saying he can walk without any help) Awareness: Intellectual Problem Solving: Slow processing;Requires verbal cues General Comments: Upon arrival, pt presenting lethargic and mumbling but quickly becoming "more awake" when interested in topic. Pt initially self limiting and reporting he can't move with L side at all. Upon sitting at EOB, pt then becoming aggitated/frustrated and stating he wanted to walk but doesn't want any one to touch him. Pt with poor safety and awareness of deficits. Pt also benefiting from simple, direct cues and calm environment.   General Comments  VSS on RA    Exercises     Shoulder Instructions      Home Living Family/patient expects to be discharged to:: Private residence Living Arrangements: Non-relatives/Friends Available Help at Discharge: Friend(s);Available 24 hours/day Type of Home: House Home Access: Stairs to enter [employer] of Steps: 3 Entrance Stairs-Rails: Right;Left Home Layout: One level     Bathroom Shower/Tub: Engineer, site: Standard     Home Equipment: Shower seat          Prior Functioning/Environment Level of Independence: Independent        Comments: uses prosthetic        OT Problem List: Decreased strength;Decreased range of motion;Decreased activity tolerance;Impaired balance (sitting and/or standing);Decreased cognition;Decreased safety awareness;Decreased knowledge of use of DME or AE;Decreased knowledge of precautions;Pain      OT Treatment/Interventions: Self-care/ADL training;Therapeutic exercise;Energy conservation;DME and/or AE instruction;Therapeutic activities;Patient/family education    OT Goals(Current goals can be found in the care plan section) Acute Rehab OT Goals Patient Stated Goal: Lay here and rest OT Goal Formulation: With patient Time For Goal Achievement: 05/09/20 Potential to Achieve Goals: Good  OT Frequency: Min 2X/week   Barriers to D/C:            Co-evaluation              AM-PAC OT "6 Clicks" Daily Activity     Outcome Measure Help from another person eating meals?: A Little Help from another person taking care of personal grooming?: A Little Help from another person toileting, which includes using toliet, bedpan, or urinal?: A Lot Help from another person bathing (including washing, rinsing, drying)?: A Lot Help from another person to put on and taking off regular upper body clothing?: A Little Help from another person to put on and taking off regular lower body clothing?: A Lot 6 Click Score: 15   End of Session Nurse Communication: Mobility status;Other (comment) (Decreased safety)  Activity Tolerance: Patient tolerated treatment well  Patient left: in bed;with call bell/phone within reach  OT Visit Diagnosis: Other abnormalities of gait and mobility (R26.89);Unsteadiness on feet (R26.81);Muscle weakness (generalized) (M62.81);Pain Pain - Right/Left: Right Pain - part of body: Leg                Time: [id number] OT Time Calculation  (min): 30 min Charges:  OT General Charges $OT Visit: 1 Visit OT Evaluation $OT Eval Moderate Complexity: [place], OTR/L Acute Rehab Pager: [phone] Office: [place] 04/25/2020, 12:56 PM

## 2020-04-25 NOTE — Progress Notes (Signed)
Inpatient Diabetes Program Recommendations  AACE/ADA: New Consensus Statement on Inpatient Glycemic Control (2015)  Target Ranges:  Prepandial:   less than 140 mg/dL      Peak postprandial:   less than 180 mg/dL (1-2 hours)      Critically ill patients:  140 - 180 mg/dL   Results for TAJIRI, SEKEL (MRN [name]) as of 04/25/2020 10:06  Ref. Range 04/25/2020 01:28  Glucose Latest Ref Range: 70 - 99 mg/dL 65 (L)   Results for SHAUNDELL, LEDFORD (MRN [name]) as of 04/25/2020 10:06  Ref. Range 04/25/2020 01:17 04/25/2020 06:09 04/25/2020 07:47 04/25/2020 09:23 04/25/2020 09:31  Glucose-Capillary Latest Ref Range: 70 - 99 mg/dL 141 (H) 222 (H) 257 (H) 504 (HH) 371 (H)    To ED with Left-sided weakness with slurred speech and left facial droop   History: DM, HIV, CKD  Home DM Meds: Levemir 50 units Daily       Novolog 20 units TID with meals  Current Orders: Levemir 35 units Daily     MD- Note Levemir to start this AM  Please also consider placing orders for:  1. Novolog Moderate Correction Scale/ SSI (0-15 units) TID AC + HS  2. Noovlog Meal Coverage: Novolog 6 units TID with meals   (Please add the following Hold Parameters: Hold if pt eats <50% of meal, Hold if pt NPO)    --Will follow patient during hospitalization--  Wyn Quaker RN, MSN, CDE Diabetes Coordinator Inpatient Glycemic Control Team Team Pager: [phone] (8a-5p)

## 2020-04-25 NOTE — Telephone Encounter (Signed)
Attempted to call patient 04/25/2020 for pharmacy transitions of care follow-up call but line was busy. Chart indicates an ED admission today for left-sided weakness. Will follow-up in two days to reassess.  Laurey Arrow  [facility] PharmD Candidate [phone]

## 2020-04-25 NOTE — ED Notes (Signed)
Pt transported to mri 

## 2020-04-26 ENCOUNTER — Observation Stay (HOSPITAL_COMMUNITY): Payer: Medicare HMO

## 2020-04-26 DIAGNOSIS — N184 Chronic kidney disease, stage 4 (severe): Secondary | ICD-10-CM | POA: Diagnosis not present

## 2020-04-26 DIAGNOSIS — Z20822 Contact with and (suspected) exposure to covid-19: Secondary | ICD-10-CM | POA: Diagnosis not present

## 2020-04-26 DIAGNOSIS — Z9119 Patient's noncompliance with other medical treatment and regimen: Secondary | ICD-10-CM | POA: Diagnosis not present

## 2020-04-26 DIAGNOSIS — I10 Essential (primary) hypertension: Secondary | ICD-10-CM | POA: Diagnosis not present

## 2020-04-26 DIAGNOSIS — G8194 Hemiplegia, unspecified affecting left nondominant side: Secondary | ICD-10-CM | POA: Diagnosis not present

## 2020-04-26 DIAGNOSIS — R29810 Facial weakness: Secondary | ICD-10-CM | POA: Diagnosis not present

## 2020-04-26 DIAGNOSIS — I639 Cerebral infarction, unspecified: Secondary | ICD-10-CM | POA: Diagnosis not present

## 2020-04-26 DIAGNOSIS — E669 Obesity, unspecified: Secondary | ICD-10-CM | POA: Diagnosis present

## 2020-04-26 DIAGNOSIS — Z79899 Other long term (current) drug therapy: Secondary | ICD-10-CM | POA: Diagnosis not present

## 2020-04-26 DIAGNOSIS — E1165 Type 2 diabetes mellitus with hyperglycemia: Secondary | ICD-10-CM | POA: Diagnosis present

## 2020-04-26 DIAGNOSIS — M6281 Muscle weakness (generalized): Secondary | ICD-10-CM | POA: Diagnosis not present

## 2020-04-26 DIAGNOSIS — I6389 Other cerebral infarction: Secondary | ICD-10-CM | POA: Diagnosis not present

## 2020-04-26 DIAGNOSIS — Z6838 Body mass index (BMI) 38.0-38.9, adult: Secondary | ICD-10-CM | POA: Diagnosis not present

## 2020-04-26 DIAGNOSIS — R2681 Unsteadiness on feet: Secondary | ICD-10-CM | POA: Diagnosis not present

## 2020-04-26 DIAGNOSIS — I6930 Unspecified sequelae of cerebral infarction: Secondary | ICD-10-CM | POA: Diagnosis not present

## 2020-04-26 DIAGNOSIS — I739 Peripheral vascular disease, unspecified: Secondary | ICD-10-CM

## 2020-04-26 DIAGNOSIS — E1151 Type 2 diabetes mellitus with diabetic peripheral angiopathy without gangrene: Secondary | ICD-10-CM | POA: Diagnosis not present

## 2020-04-26 DIAGNOSIS — E118 Type 2 diabetes mellitus with unspecified complications: Secondary | ICD-10-CM | POA: Diagnosis not present

## 2020-04-26 DIAGNOSIS — E785 Hyperlipidemia, unspecified: Secondary | ICD-10-CM

## 2020-04-26 DIAGNOSIS — I252 Old myocardial infarction: Secondary | ICD-10-CM | POA: Diagnosis not present

## 2020-04-26 DIAGNOSIS — Z794 Long term (current) use of insulin: Secondary | ICD-10-CM | POA: Diagnosis not present

## 2020-04-26 DIAGNOSIS — E162 Hypoglycemia, unspecified: Secondary | ICD-10-CM | POA: Diagnosis not present

## 2020-04-26 DIAGNOSIS — R5381 Other malaise: Secondary | ICD-10-CM | POA: Diagnosis not present

## 2020-04-26 DIAGNOSIS — I2510 Atherosclerotic heart disease of native coronary artery without angina pectoris: Secondary | ICD-10-CM | POA: Diagnosis not present

## 2020-04-26 DIAGNOSIS — R2689 Other abnormalities of gait and mobility: Secondary | ICD-10-CM | POA: Diagnosis not present

## 2020-04-26 DIAGNOSIS — Z89511 Acquired absence of right leg below knee: Secondary | ICD-10-CM | POA: Diagnosis not present

## 2020-04-26 DIAGNOSIS — R29701 NIHSS score 1: Secondary | ICD-10-CM | POA: Diagnosis not present

## 2020-04-26 DIAGNOSIS — I129 Hypertensive chronic kidney disease with stage 1 through stage 4 chronic kidney disease, or unspecified chronic kidney disease: Secondary | ICD-10-CM | POA: Diagnosis present

## 2020-04-26 DIAGNOSIS — Z7982 Long term (current) use of aspirin: Secondary | ICD-10-CM | POA: Diagnosis not present

## 2020-04-26 DIAGNOSIS — R479 Unspecified speech disturbances: Secondary | ICD-10-CM | POA: Diagnosis not present

## 2020-04-26 DIAGNOSIS — B20 Human immunodeficiency virus [HIV] disease: Secondary | ICD-10-CM | POA: Diagnosis present

## 2020-04-26 DIAGNOSIS — R4182 Altered mental status, unspecified: Secondary | ICD-10-CM | POA: Diagnosis not present

## 2020-04-26 DIAGNOSIS — R531 Weakness: Secondary | ICD-10-CM | POA: Diagnosis not present

## 2020-04-26 DIAGNOSIS — I6381 Other cerebral infarction due to occlusion or stenosis of small artery: Secondary | ICD-10-CM | POA: Diagnosis not present

## 2020-04-26 DIAGNOSIS — R279 Unspecified lack of coordination: Secondary | ICD-10-CM | POA: Diagnosis not present

## 2020-04-26 DIAGNOSIS — R2990 Unspecified symptoms and signs involving the nervous system: Secondary | ICD-10-CM | POA: Diagnosis not present

## 2020-04-26 DIAGNOSIS — Z89611 Acquired absence of right leg above knee: Secondary | ICD-10-CM | POA: Diagnosis not present

## 2020-04-26 DIAGNOSIS — Z955 Presence of coronary angioplasty implant and graft: Secondary | ICD-10-CM | POA: Diagnosis not present

## 2020-04-26 DIAGNOSIS — E1122 Type 2 diabetes mellitus with diabetic chronic kidney disease: Secondary | ICD-10-CM | POA: Diagnosis not present

## 2020-04-26 DIAGNOSIS — E119 Type 2 diabetes mellitus without complications: Secondary | ICD-10-CM | POA: Diagnosis not present

## 2020-04-26 DIAGNOSIS — R69 Illness, unspecified: Secondary | ICD-10-CM | POA: Diagnosis not present

## 2020-04-26 DIAGNOSIS — Z9114 Patient's other noncompliance with medication regimen: Secondary | ICD-10-CM | POA: Diagnosis not present

## 2020-04-26 DIAGNOSIS — E1169 Type 2 diabetes mellitus with other specified complication: Secondary | ICD-10-CM

## 2020-04-26 DIAGNOSIS — D631 Anemia in chronic kidney disease: Secondary | ICD-10-CM | POA: Diagnosis present

## 2020-04-26 LAB — GLUCOSE, CAPILLARY
Glucose-Capillary: 185 mg/dL — ABNORMAL HIGH (ref 70–99)
Glucose-Capillary: 228 mg/dL — ABNORMAL HIGH (ref 70–99)
Glucose-Capillary: 187 mg/dL — ABNORMAL HIGH (ref 70–99)
Glucose-Capillary: 270 mg/dL — ABNORMAL HIGH (ref 70–99)

## 2020-04-26 LAB — FERRITIN: Ferritin: 347 ng/mL — ABNORMAL HIGH (ref 24–336)

## 2020-04-26 LAB — CBC WITH DIFFERENTIAL/PLATELET
Abs Immature Granulocytes: 0.02 10*3/uL (ref 0.00–0.07)
Basophils Absolute: 0 10*3/uL (ref 0.0–0.1)
Basophils Relative: 1 %
Eosinophils Absolute: 0.2 10*3/uL (ref 0.0–0.5)
Eosinophils Relative: 3 %
HCT: 34.3 % — ABNORMAL LOW (ref 39.0–52.0)
Hemoglobin: 10.7 g/dL — ABNORMAL LOW (ref 13.0–17.0)
Immature Granulocytes: 0 %
Lymphocytes Relative: 40 %
Lymphs Abs: 2.1 10*3/uL (ref 0.7–4.0)
MCH: 26.4 pg (ref 26.0–34.0)
MCHC: 31.2 g/dL (ref 30.0–36.0)
MCV: 84.5 fL (ref 80.0–100.0)
Monocytes Absolute: 0.4 10*3/uL (ref 0.1–1.0)
Monocytes Relative: 8 %
Neutro Abs: 2.5 10*3/uL (ref 1.7–7.7)
Neutrophils Relative %: 48 %
Platelets: 257 10*3/uL (ref 150–400)
RBC: 4.06 MIL/uL — ABNORMAL LOW (ref 4.22–5.81)
RDW: 14.3 % (ref 11.5–15.5)
WBC: 5.2 10*3/uL (ref 4.0–10.5)
nRBC: 0 % (ref 0.0–0.2)

## 2020-04-26 LAB — BASIC METABOLIC PANEL
Anion gap: 8 (ref 5–15)
BUN: 32 mg/dL — ABNORMAL HIGH (ref 6–20)
CO2: 23 mmol/L (ref 22–32)
Calcium: 9.1 mg/dL (ref 8.9–10.3)
Chloride: 103 mmol/L (ref 98–111)
Creatinine, Ser: 2.3 mg/dL — ABNORMAL HIGH (ref 0.61–1.24)
GFR, Estimated: 33 mL/min — ABNORMAL LOW (ref >60)
Glucose, Bld: 238 mg/dL — ABNORMAL HIGH (ref 70–99)
Potassium: 4.1 mmol/L (ref 3.5–5.1)
Sodium: 134 mmol/L — ABNORMAL LOW (ref 135–145)

## 2020-04-26 LAB — IRON AND TIBC
Iron: 71 ug/dL (ref 45–182)
Saturation Ratios: 26 % (ref 17.9–39.5)
TIBC: 276 ug/dL (ref 250–450)
UIBC: 205 ug/dL

## 2020-04-26 LAB — ECHOCARDIOGRAM COMPLETE BUBBLE STUDY
Area-P 1/2: 2.24 cm2
Calc EF: 53.5 %
S' Lateral: 3.3 cm
Single Plane A2C EF: 54.1 %
Single Plane A4C EF: 53 %

## 2020-04-26 LAB — RETICULOCYTES
Immature Retic Fract: 18.1 % — ABNORMAL HIGH (ref 2.3–15.9)
RBC.: 3.86 MIL/uL — ABNORMAL LOW (ref 4.22–5.81)
Retic Count, Absolute: 69.1 10*3/uL (ref 19.0–186.0)
Retic Ct Pct: 1.8 % (ref 0.4–3.1)

## 2020-04-26 LAB — MAGNESIUM: Magnesium: 2 mg/dL (ref 1.7–2.4)

## 2020-04-26 LAB — VITAMIN B12: Vitamin B-12: 451 pg/mL (ref 180–914)

## 2020-04-26 LAB — FOLATE: Folate: 8.7 ng/mL (ref >5.9)

## 2020-04-26 MED ORDER — INSULIN DETEMIR 100 UNIT/ML ~~LOC~~ SOLN
5.0000 [IU] | Freq: Once | SUBCUTANEOUS | Status: AC
  Administered 2020-04-26: 5 [IU] via SUBCUTANEOUS
  Filled 2020-04-26: qty 0.05

## 2020-04-26 MED ORDER — INSULIN ASPART 100 UNIT/ML ~~LOC~~ SOLN
6.0000 [IU] | Freq: Three times a day (TID) | SUBCUTANEOUS | Status: DC
  Administered 2020-04-26: 6 [IU] via SUBCUTANEOUS

## 2020-04-26 MED ORDER — INSULIN DETEMIR 100 UNIT/ML ~~LOC~~ SOLN
40.0000 [IU] | Freq: Every day | SUBCUTANEOUS | Status: DC
  Filled 2020-04-26: qty 0.4

## 2020-04-26 NOTE — NC FL2 (Signed)
[place] LEVEL OF CARE SCREENING TOOL     IDENTIFICATION  Patient Name: Keith [name] Birthdate: 1965-09-25 Sex: male Admission Date (Current Location): 04/25/2020  [facility] and [place] Number:  Herbalist and Address:  [facility] [place], [place] [address] [place], [place] [address]      Provider Number: [id number]  Attending Physician Name and Address:  Irine Seal, MD  Relative Name and Phone Number:       Current Level of Care: Hospital Recommended Level of Care: [place] Prior Approval Number:    Date Approved/Denied:   PASRR Number: [id number]  Discharge Plan: SNF    Current Diagnoses: Patient Active Problem List   Diagnosis Date Noted  . Left-sided weakness 04/25/2020  . Hypoglycemia 04/25/2020  . Acute ST elevation myocardial infarction (STEMI) due to occlusion of circumflex coronary artery ([place]) 03/22/2020  . Uncontrolled type 2 diabetes mellitus with hyperglycemia ([place]) 01/14/2019  . Elevated glucose 01/14/2019  . Hemoglobin A1C between 7% and 9% indicating borderline diabetic control 01/14/2019  . Hx of BKA, right ([place]) 01/14/2019  . Pressure injury of skin 11/11/2018  . STEMI (ST elevation myocardial infarction) ([place]) 11/10/2018  . CKD (chronic kidney disease) stage 4, GFR 15-29 ml/min (HCC) 11/10/2018  . Amputation stump infection ([place]) 10/28/2018  . Type II diabetes mellitus with renal manifestations ([place]) 08/07/2018  . Uncontrolled type 2 diabetes mellitus with hyperosmolar nonketotic hyperglycemia ([place]) 08/07/2018  . Non-pressure chronic ulcer of right calf limited to breakdown of skin ([place]) 07/06/2018  . Type 2 diabetes mellitus without complication, without long-term current use of insulin ([place]) 06/15/2018  . Chronic low back pain without sciatica 06/15/2018  . Idiopathic chronic venous hypertension of left lower extremity with ulcer and inflammation ([place])   . Venous stasis ulcer of left  calf limited to breakdown of skin without varicose veins ([place]) 04/23/2018  . Acquired absence of right leg below knee ([place]) 06/13/2017  . Acute renal failure superimposed on stage 3 chronic kidney disease ([place]) 03/15/2017  . Diabetic polyneuropathy associated with type 2 diabetes mellitus ([place]) 01/23/2017  . AKI (acute kidney injury) ([place]) 09/28/2016  . Nausea vomiting and diarrhea 09/28/2016  . Onychomycosis of multiple toenails with type 2 diabetes mellitus ([place]) 08/29/2015  . MRSA carrier 04/20/2014  . Penile wart 02/22/2014  . HIV disease ([place])   . Insulin-requiring or dependent type II diabetes mellitus ([place]) 02/04/2014  . Dental anomaly 11/20/2012  . Arthritis of right knee 02/23/2012  . Hyperlipidemia 11/10/2011  . Chronic pain 08/07/2011  . Meralgia paraesthetica 04/23/2011  . ERECTILE DYSFUNCTION 08/22/2008  . Essential hypertension 05/19/2008    Orientation RESPIRATION BLADDER Height & Weight     Self,Time,Situation,Place  Normal Continent Weight: 117.5 kg Height:  '5\' 9"'$  (175.3 cm)  BEHAVIORAL SYMPTOMS/MOOD NEUROLOGICAL BOWEL NUTRITION STATUS      Continent Diet (Heart healthy, carb modified with thin liquids)  AMBULATORY STATUS COMMUNICATION OF NEEDS Skin   Limited Assist Verbally Normal                       Personal Care Assistance Level of Assistance  Bathing,Feeding,Dressing Bathing Assistance: Limited assistance Feeding assistance: Independent Dressing Assistance: Limited assistance     Functional Limitations Info  Sight,Hearing,Speech Sight Info: Adequate Hearing Info: Adequate Speech Info: Impaired    SPECIAL CARE FACTORS FREQUENCY  PT (By licensed PT),OT (By licensed OT),Speech therapy     PT Frequency: 5x/wk OT Frequency: 5x/wk  Speech Therapy Frequency: 5x/wk      Contractures Contractures Info: Not present    Additional Factors Info  Code Status,Allergies,Insulin Sliding Scale,Psychotropic Code Status Info: Full Allergies Info:  Elemental Sulfur/ Sulfa Antibiotics Psychotropic Info: Neurontin 100 mg TID Insulin Sliding Scale Info: Novolog 0-5 units SQ at bedtime/ Novolog 0-9 units SQ TID/ Novolog 6 units SQ with meals/ Levemir 40 units SQ daily       Current Medications (04/26/2020):  This is the current hospital active medication list Current Facility-Administered Medications  Medication Dose Route Frequency Provider Last Rate Last Admin  .  stroke: mapping our early stages of recovery book   Does not apply Once Rise Patience, MD      . acetaminophen (TYLENOL) tablet 650 mg  650 mg Oral Q4H PRN Rise Patience, MD       Or  . acetaminophen (TYLENOL) 160 MG/5ML solution 650 mg  650 mg Per Tube Q4H PRN Rise Patience, MD       Or  . acetaminophen (TYLENOL) suppository 650 mg  650 mg Rectal Q4H PRN Rise Patience, MD      . amLODipine (NORVASC) tablet 10 mg  10 mg Oral Daily Rise Patience, MD   10 mg at 04/26/20 0841  . aspirin EC tablet 81 mg  81 mg Oral Daily Rise Patience, MD   81 mg at 04/26/20 0840  . atorvastatin (LIPITOR) tablet 80 mg  80 mg Oral q1800 Rise Patience, MD   80 mg at 04/25/20 1809  . bictegravir-emtricitabine-tenofovir AF (BIKTARVY) 50-200-25 MG per tablet 1 tablet  1 tablet Oral Daily Rise Patience, MD   1 tablet at 04/26/20 [phone]  . carvedilol (COREG) tablet 25 mg  25 mg Oral BID WC Rise Patience, MD   25 mg at 04/26/20 0841  . dapsone tablet 100 mg  100 mg Oral Daily Rise Patience, MD   100 mg at 04/26/20 0841  . enoxaparin (LOVENOX) injection 40 mg  40 mg Subcutaneous Q24H Rise Patience, MD   40 mg at 04/26/20 0840  . gabapentin (NEURONTIN) capsule 100 mg  100 mg Oral TID Rise Patience, MD   100 mg at 04/26/20 0842  . icosapent Ethyl (VASCEPA) 1 g capsule 2 g  2 g Oral BID Rise Patience, MD   2 g at 04/26/20 0842  . insulin aspart (novoLOG) injection 0-5 Units  0-5 Units Subcutaneous QHS Terrilee Croak, MD   3  Units at 04/25/20 2227  . insulin aspart (novoLOG) injection 0-9 Units  0-9 Units Subcutaneous TID WC Terrilee Croak, MD   3 Units at 04/26/20 1314  . insulin aspart (novoLOG) injection 6 Units  6 Units Subcutaneous TID WC Eugenie Filler, MD      . Derrill Memo ON 04/27/2020] insulin detemir (LEVEMIR) injection 40 Units  40 Units Subcutaneous Daily Eugenie Filler, MD      . insulin detemir (LEVEMIR) injection 5 Units  5 Units Subcutaneous Once Eugenie Filler, MD      . nitroGLYCERIN (NITROSTAT) SL tablet 0.4 mg  0.4 mg Sublingual Q5 Min x 3 PRN Rise Patience, MD      . oxybutynin (DITROPAN) tablet 5 mg  5 mg Oral BID Rise Patience, MD   5 mg at 04/26/20 0841  . oxyCODONE (Oxy IR/ROXICODONE) immediate release tablet 10 mg  10 mg Oral Q5H PRN Rise Patience, MD   10 mg at 04/25/20 1818  .  ticagrelor (BRILINTA) tablet 90 mg  90 mg Oral BID Rise Patience, MD   90 mg at 04/26/20 [id number]     Discharge Medications: Please see discharge summary for a list of discharge medications.  Relevant Imaging Results:  Relevant Lab Results:   Additional Information SS#: [id number] BKA  Pollie Friar, RN

## 2020-04-26 NOTE — TOC Initial Note (Signed)
Transition of Care [facility]) - Initial/Assessment Note    Patient Details  Name: Keith [name] MRN: [id number] Date of Birth: [birth year]  [facility] CM/SW Contact:    Pollie Friar, RN Phone Number: 04/26/2020, 3:13 PM  Clinical Narrative:                 CM met with the patient to discuss d/c plans and recommendations. Pt prefers inpatient rehab stay over SNF but was open to having information sent out for SNF rehab. CM has sent message to PT that saw pt today to see if she feels he can tolerate an inpatient rehab stay. If he is, pt is open to being faxed to the [facility] IR.  Pt faxed out for SNF but waiting to hear from PT if pt appropriate for IR referral. TOC following.   Expected Discharge Plan: Skilled Nursing Facility Barriers to Discharge: Continued Medical Work up   Patient Goals and CMS Choice   CMS Medicare.gov Compare Post Acute Care list provided to:: Patient Choice offered to / list presented to : Patient  Expected Discharge Plan and Services Expected Discharge Plan: [place] In-house Referral: Clinical Social Work Discharge Planning Services: CM Consult Post Acute Care Choice: [place] Living arrangements for the past 2 months: [place]                                      Prior Living Arrangements/Services Living arrangements for the past 2 months: Single Family Home Lives with:: Roommate Patient language and need for interpreter reviewed:: Yes Do you feel safe going back to the place where you live?: Yes      Need for Family Participation in Patient Care: Yes (Comment)     Criminal Activity/Legal Involvement Pertinent to Current Situation/Hospitalization: No - Comment as needed  Activities of Daily Living Home Assistive Devices/Equipment: Prosthesis (right leg) ADL Screening (condition at time of admission) Patient's cognitive ability adequate to safely complete daily activities?: Yes Is  the patient deaf or have difficulty hearing?: No Does the patient have difficulty seeing, even when wearing glasses/contacts?: No Does the patient have difficulty concentrating, remembering, or making decisions?: No Patient able to express need for assistance with ADLs?: Yes Does the patient have difficulty dressing or bathing?: No Independently performs ADLs?: Yes (appropriate for developmental age) Does the patient have difficulty walking or climbing stairs?: Yes Weakness of Legs: None Weakness of Arms/Hands: None  Permission Sought/Granted                  Emotional Assessment Appearance:: Appears stated age Attitude/Demeanor/Rapport: Engaged Affect (typically observed): Accepting Orientation: : Oriented to Self,Oriented to Place,Oriented to  Time,Oriented to Situation   Psych Involvement: No (comment)  Admission diagnosis:  Left-sided weakness [R53.1] Acute CVA (cerebrovascular accident) ([place]) [I63.9] Speech disturbance, unspecified type [R47.9] Patient Active Problem List   Diagnosis Date Noted  . Left-sided weakness 04/25/2020  . Hypoglycemia 04/25/2020  . Acute ST elevation myocardial infarction (STEMI) due to occlusion of circumflex coronary artery ([place]) 03/22/2020  . Uncontrolled type 2 diabetes mellitus with hyperglycemia ([place]) 01/14/2019  . Elevated glucose 01/14/2019  . Hemoglobin A1C between 7% and 9% indicating borderline diabetic control 01/14/2019  . Hx of BKA, right ([place]) 01/14/2019  . Pressure injury of skin 11/11/2018  . STEMI (ST elevation myocardial infarction) ([place]) 11/10/2018  . CKD (chronic kidney disease) stage  4, GFR 15-29 ml/min (HCC) 11/10/2018  . Amputation stump infection ([place]) 10/28/2018  . Type II diabetes mellitus with renal manifestations ([place]) 08/07/2018  . Uncontrolled type 2 diabetes mellitus with hyperosmolar nonketotic hyperglycemia ([place]) 08/07/2018  . Non-pressure chronic ulcer of right calf limited to breakdown of skin ([place])  07/06/2018  . Type 2 diabetes mellitus without complication, without long-term current use of insulin ([place]) 06/15/2018  . Chronic low back pain without sciatica 06/15/2018  . Idiopathic chronic venous hypertension of left lower extremity with ulcer and inflammation ([place])   . Venous stasis ulcer of left calf limited to breakdown of skin without varicose veins ([place]) 04/23/2018  . Acquired absence of right leg below knee ([place]) 06/13/2017  . Acute renal failure superimposed on stage 3 chronic kidney disease ([place]) 03/15/2017  . Diabetic polyneuropathy associated with type 2 diabetes mellitus ([place]) 01/23/2017  . AKI (acute kidney injury) ([place]) 09/28/2016  . Nausea vomiting and diarrhea 09/28/2016  . Onychomycosis of multiple toenails with type 2 diabetes mellitus ([place]) 08/29/2015  . MRSA carrier 04/20/2014  . Penile wart 02/22/2014  . HIV disease ([place])   . Insulin-requiring or dependent type II diabetes mellitus ([place]) 02/04/2014  . Dental anomaly 11/20/2012  . Arthritis of right knee 02/23/2012  . Hyperlipidemia 11/10/2011  . Chronic pain 08/07/2011  . Meralgia paraesthetica 04/23/2011  . ERECTILE DYSFUNCTION 08/22/2008  . Essential hypertension 05/19/2008   PCP:  [name], FNP Pharmacy:   [place], [place] - [address] [place] [address] Phone: [phone] Fax: [phone]     Social Determinants of Health (SDOH) Interventions    Readmission Risk Interventions Readmission Risk Prevention Plan 11/13/2018 11/13/2018 11/03/2018  Transportation Screening - Complete Complete  PCP or Specialist Appt within 3-5 Days - - Complete  HRI or [place] - - Complete  Social Work Consult for [place] Planning/Counseling - - Complete  Palliative Care Screening - - Not Applicable  Medication Review Press photographer) - Complete Complete  PCP or Specialist appointment within 3-5 days of discharge Complete - -  [place] or  Home Care Consult Complete - -  SW Recovery Care/Counseling Consult Complete - -  Palliative Care Screening Not Applicable - -  [place] Not Applicable - -  Some recent data might be hidden

## 2020-04-26 NOTE — Progress Notes (Signed)
Rehab Admissions Coordinator Note:  Patient was screened by Cleatrice Burke for appropriateness for an Inpatient Acute Rehab Consult per Dr. Grandville Silos recommendation as well as therapy change in recommendation today. .  At this time, we are recommending Inpatient Rehab consult. I will place order per protocol.  Cleatrice Burke RN MSN 04/26/2020, 4:56 PM  I can be reached at [phone].

## 2020-04-26 NOTE — Progress Notes (Signed)
Patient ambulated to restroom from bed with no complications. When attempting to stand up after finishing in the restroom, the patient reported severe left sided weakness most prominent in left arm. When staff entered the room the patient could not stand up and need assistance transferring to a chair from the toilet and then to the bed. A code stroke was called at this time. As patient was being assessed in the bed his left side began to return to baseline in strength and mobility. The rapid team arrived to assess as well. Patient maintained A&O with no sensory deficits noted. No new orders placed but to keep close watch on patient's neuro status. NIH was performed and documented with minimal changes from previous assessment. Will continue to monitor patient.

## 2020-04-26 NOTE — Progress Notes (Signed)
STROKE TEAM PROGRESS NOTE   INTERVAL HISTORY Patient had transient worsening of his slurred speech and left-sided weakness last night while ambulating to the restroom.  His symptoms resolved after he lay flat he has remained stable overnight.  LDL cholesterol is elevated at 205 mg percent.  Echocardiogram is pending.  Vitals:   04/26/20 0321 04/26/20 0357 04/26/20 0823 04/26/20 1124  BP: (!) 141/91 126/75 140/88 125/73  Pulse: 61 64 (!) 57 (!) 59  Resp: 18  16 (!) 22  Temp: 99.1 F (37.3 C) 98.1 F (36.7 C) 97.6 F (36.4 C) 97.8 F (36.6 C)  TempSrc:  Oral Oral Oral  SpO2: 100% 100% 100% 96%  Weight:      Height:       CBC:  Recent Labs  Lab 04/25/20 0126 04/25/20 0128 04/25/20 0619 04/26/20 1228  WBC 7.0  --  7.5 5.2  NEUTROABS 3.0  --   --  2.5  HGB 11.1*   < > 10.6* 10.7*  HCT 36.9*   < > 34.9* 34.3*  MCV 86.6  --  86.8 84.5  PLT 272  --  246 257   < > = values in this interval not displayed.   Basic Metabolic Panel:  Recent Labs  Lab 04/25/20 0126 04/25/20 0128 04/25/20 0619 04/26/20 1228  NA 136 140  --  134*  K 4.0 4.1  --  4.1  CL 103 105  --  103  CO2 22  --   --  23  GLUCOSE 66* 65*  --  238*  BUN 30* 32*  --  32*  CREATININE 2.80* 2.90* 2.65* 2.30*  CALCIUM 9.6  --   --  9.1  MG  --   --   --  2.0   Lab Results  Component Value Date   CHOL 280 (H) 04/25/2020   HDL 32 (L) 04/25/2020   LDLCALC 205 (H) 04/25/2020   TRIG 217 (H) 04/25/2020   CHOLHDL 8.8 04/25/2020    Lab Results  Component Value Date   HGBA1C >15.5 (H) 03/24/2020   Urine Drug Screen:  Recent Labs  Lab 04/25/20 0755  LABOPIA NONE DETECTED  COCAINSCRNUR NONE DETECTED  LABBENZ NONE DETECTED  AMPHETMU NONE DETECTED  THCU NONE DETECTED  LABBARB NONE DETECTED     Alcohol Level  Recent Labs  Lab 04/25/20 0358  ETH <10    IMAGING past 24 hours ECHOCARDIOGRAM COMPLETE BUBBLE STUDY  Result Date: 04/26/2020    ECHOCARDIOGRAM REPORT   Patient Name:   PRENTISS JURKOVIC Sylve Date  of Exam: 04/26/2020 Medical Rec #:  [name]      Height:       69.0 in Accession #:    [id number]     Weight:       259.0 lb Date of Birth:  07-04-1965      BSA:          2.306 m Patient Age:    55 years       BP:           140/88 mmHg Patient Gender: M              HR:           66 bpm. Exam Location:  Inpatient Procedure: 2D Echo, Cardiac Doppler, Color Doppler and Saline Contrast Bubble            Study Indications:    Stroke  History:        Patient has prior  history of Echocardiogram examinations, most                 recent 05/24/2019. Previous Myocardial Infarction and CAD,                 Stroke; Risk Factors:Hypertension, Dyslipidemia and Diabetes.  Sonographer:    Roseanna Rainbow RDCS Referring Phys: [id number] [facility]  Sonographer Comments: Image acquisition challenging due to respiratory motion. IMPRESSIONS  1. Left ventricular ejection fraction, by estimation, is 50 to 55%. The left ventricle has low normal function. The left ventricle has no regional wall motion abnormalities. There is mild concentric left ventricular hypertrophy. Left ventricular diastolic parameters are consistent with Grade I diastolic dysfunction (impaired relaxation).  2. Right ventricular systolic function is normal. The right ventricular size is normal.  3. The mitral valve is normal in structure. Trivial mitral valve regurgitation. No evidence of mitral stenosis.  4. The aortic valve is tricuspid. Aortic valve regurgitation is not visualized. No aortic stenosis is present.  5. Aortic dilatation noted. There is mild dilatation of the ascending aorta, measuring 39 mm.  6. The inferior vena cava is normal in size with greater than 50% respiratory variability, suggesting right atrial pressure of 3 mmHg.  7. Agitated saline contrast bubble study was negative, with no evidence of any interatrial shunt. FINDINGS  Left Ventricle: Left ventricular ejection fraction, by estimation, is 50 to 55%. The left ventricle has low normal function.  The left ventricle has no regional wall motion abnormalities. The left ventricular internal cavity size was normal in size. There is mild concentric left ventricular hypertrophy. Left ventricular diastolic parameters are consistent with Grade I diastolic dysfunction (impaired relaxation). Indeterminate filling pressures. Right Ventricle: The right ventricular size is normal. No increase in right ventricular wall thickness. Right ventricular systolic function is normal. Left Atrium: Left atrial size was normal in size. Right Atrium: Right atrial size was normal in size. Pericardium: There is no evidence of pericardial effusion. Mitral Valve: The mitral valve is normal in structure. Trivial mitral valve regurgitation. No evidence of mitral valve stenosis. Tricuspid Valve: The tricuspid valve is normal in structure. Tricuspid valve regurgitation is not demonstrated. No evidence of tricuspid stenosis. Aortic Valve: The aortic valve is tricuspid. Aortic valve regurgitation is not visualized. No aortic stenosis is present. Pulmonic Valve: The pulmonic valve was normal in structure. Pulmonic valve regurgitation is not visualized. No evidence of pulmonic stenosis. Aorta: Aortic dilatation noted. There is mild dilatation of the ascending aorta, measuring 39 mm. Venous: The inferior vena cava is normal in size with greater than 50% respiratory variability, suggesting right atrial pressure of 3 mmHg. IAS/Shunts: No atrial level shunt detected by color flow Doppler. Agitated saline contrast was given intravenously to evaluate for intracardiac shunting. Agitated saline contrast bubble study was negative, with no evidence of any interatrial shunt.  LEFT VENTRICLE PLAX 2D LVIDd:         4.50 cm      Diastology LVIDs:         3.30 cm      LV e' medial:    5.44 cm/s LV PW:         1.11 cm      LV E/e' medial:  13.9 LV IVS:        1.30 cm      LV e' lateral:   7.72 cm/s LVOT diam:     2.40 cm      LV E/e' lateral: 9.8 LV SV:  81 LV SV Index:   35 LVOT Area:     4.52 cm  LV Volumes (MOD) LV vol d, MOD A2C: 111.0 ml LV vol d, MOD A4C: 114.0 ml LV vol s, MOD A2C: 50.9 ml LV vol s, MOD A4C: 53.6 ml LV SV MOD A2C:     60.1 ml LV SV MOD A4C:     114.0 ml LV SV MOD BP:      61.7 ml RIGHT VENTRICLE            IVC RV S prime:     9.90 cm/s  IVC diam: 1.40 cm TAPSE (M-mode): 1.9 cm LEFT ATRIUM           Index       RIGHT ATRIUM           Index LA diam:      2.90 cm 1.26 cm/m  RA Area:     13.80 cm LA Vol (A2C): 38.7 ml 16.78 ml/m RA Volume:   29.90 ml  12.97 ml/m LA Vol (A4C): 31.1 ml 13.49 ml/m  AORTIC VALVE LVOT Vmax:   95.30 cm/s LVOT Vmean:  67.900 cm/s LVOT VTI:    0.178 m  AORTA Ao Root diam: 4.90 cm Ao Asc diam:  3.90 cm MITRAL VALVE MV Area (PHT): 2.24 cm    SHUNTS MV Decel Time: 338 msec    Systemic VTI:  0.18 m MV E velocity: 75.40 cm/s  Systemic Diam: 2.40 cm MV A velocity: 85.70 cm/s MV E/A ratio:  0.88 Skeet Latch MD Electronically signed by Skeet Latch MD Signature Date/Time: 04/26/2020/11:47:17 AM    Final     PHYSICAL EXAM Obese middle-aged African-American male not in distress. . Afebrile. Head is nontraumatic. Neck is supple without bruit.    Cardiac exam no murmur or gallop. Lungs are clear to auscultation. Distal pulses are well felt. Neurological Exam : Remains sleepy but can be aroused with some difficulty. He is oriented to time place and person. Mild dysarthria no aphasia. Follows commands well. Extraocular movements are full range without nystagmus. Face is symmetric without weakness. Tongue is midline. Motor system exam shows no upper or lower extremity drift. Slight diminished fine finger movements on the left and orbits right over left upper extremity. No drift. Sensation intact bilaterally. Reflexes are symmetric. Plantars downgoing. Gait not tested. ASSESSMENT/PLAN Mr. SATORU BRILLA is a 55 y.o. male with history of HIV, HTN, CAD/MI s/p stent, DM 2 with vascular and neurologic complications  presenting with left sided weakness and slurred speech. Possible metabolic and functional etiology.  Stroke:   Small R pontine infarct secondary to small vessel disease source  Code Stroke CT head No acute abnormality. ASPECTS 10.     MRI  Possible small R pontine infarct. Old R paracentral pontine lacune.   MRA head & neck   Atherosclerosis mild mid BA, mild to moderate distal R ICA siphon and mild R A2 stenoses  2D Echo w/ bubble ejection fraction 50 to 55%.  No wall motion abnormalities.  Mild left ventricular concentric hypertrophy LDL   205  HgbA1c >15.5 03/24/2020  VTE prophylaxis - Lovenox 40 mg sq daily   aspirin 81 mg daily and Brilinta (ticagrelor) 90 mg bid prior to admission for cardiac revascularization in 03/2020, now on aspirin 81 mg daily and Brilinta (ticagrelor) 90 mg bid. Continue DAPT at d/c.   Therapy recommendations: SNF  disposition: SNF Hypertension  Stable on the high side . Permissive hypertension (OK if <  220/120) but gradually normalize in 5-7 days . Long-term BP goal normotensive  Hyperlipidemia  Home meds:  Lipitor 80, resumed in hospital  LDL 199 03/21/2020, goal < 70  Continue statin at discharge  Diabetes type II Uncontrolled  HgbA1c >15.5 03/24/2020, goal < 7.0  CBGs Recent Labs    04/25/20 2127 04/26/20 0632 04/26/20 1124  GLUCAP 273* 185* 228*      SSI  Other Stroke Risk Factors  Hx ETOH use, alcohol level <10, advised to drink no more than 2 drink(s) a day  Hx drug use  Obesity, Body mass index is 38.25 kg/m., BMI >/= 30 associated with increased stroke risk, recommend weight loss, diet and exercise as appropriate   Coronary artery disease s/p MI w/ recent stent on aspirin and Brilinta, BB 03/2020  HIV on Biktarvy and dapsone.  R BKA  Other Active Problems  CKD stage IV w anemia of chronic dz  Hx chronic pain  Hospital day # 0 Patient presented with some slurred speech and mild right-sided left-sided weakness  secondary to right pontine lacunar infarct. He is already on dual antiplatelet therapy of aspirin and Brilinta for his cardiac cardiac stent and so we will continue it. Patient counseled to be compliant with medications and the need for aggressive risk factor modification to prevent any more disabling strokes in the future. Long discussion with patient and answered questions. Discussed with Dr. Irine Seal.  Stroke team will sign off.  Kindly call for questions greater than 50% time during this 25-minute visit was spent on counseling and coordination of care about lacunar stroke and discussion about prevention and treatment and answering questions. Antony Contras, MD To contact Stroke Continuity provider, please refer to [email or web address] After hours, contact General Neurology

## 2020-04-26 NOTE — Progress Notes (Signed)
  Echocardiogram 2D Echocardiogram has been performed.  Keith [name] 04/26/2020, 10:23 AM

## 2020-04-26 NOTE — Progress Notes (Addendum)
Inpatient Diabetes Program Recommendations  AACE/ADA: New Consensus Statement on Inpatient Glycemic Control (2015)  Target Ranges:  Prepandial:   less than 140 mg/dL      Peak postprandial:   less than 180 mg/dL (1-2 hours)      Critically ill patients:  140 - 180 mg/dL   Results for KYAL, SABHARWAL (MRN [name]) as of 04/26/2020 11:34  Ref. Range 04/26/2020 06:32 04/26/2020 11:24  Glucose-Capillary Latest Ref Range: 70 - 99 mg/dL 185 (H) 228 (H)    Home DM Meds: Levemir 50 units Daily                             Novolog 20 units TID with meals  Current Orders: Levemir 35 units Daily      Novolog Sensitive Correction Scale/ SSI (0-9 units) TID AC + HS      MD- Please consider:  1, increase Levemir to 40 units Daily  2. Please start Meal Coverage: Novolog 6 units TID with meals   (Please add the following Hold Parameters: Hold if pt eats <50% of meal, Hold if pt NPO)    --Will follow patient during hospitalization--  Wyn Quaker RN, MSN, CDE Diabetes Coordinator Inpatient Glycemic Control Team Team Pager: [phone] (8a-5p)

## 2020-04-26 NOTE — Consult Note (Signed)
Neurology Consult H&P  CC: code stroke  History is obtained from: patient and nursing staff  HPI: CARNIE BRUEMMER is a 55 y.o. male known to neurology seen yesterday and being followed by stroke service and found to have multiple chronic strokes and acute pontine stroke was coming out of the bathroom and suddenly felt weak and fell to ground. He as helped back to bed and code called.   The following information was taken for the chart:  "Patient ambulated to restroom from bed with no complications. When attempting to stand up after finishing in the restroom, the patient reported severe left sided weakness most prominent in left arm. When staff entered the room the patient could not stand up and need assistance transferring to a chair from the toilet and then to the bed. A code stroke was called at this time. As patient was being assessed in the bed his left side began to return to baseline in strength and mobility. The rapid team arrived to assess as well. Patient maintained A&O with no sensory deficits noted. No new orders placed but to keep close watch on patient's neuro status. NIH was performed and documented with minimal changes from previous assessment."   LKW: 0400 tpa given?: No, resolved IR Thrombectomy? No Modified Rankin Scale: 1-No significant post stroke disability and can perform usual duties with stroke symptoms NIHSS: 1  ROS: A complete ROS was performed and is negative except as noted in the HPI.   Past Medical History:  Diagnosis Date  . Abscess of right foot    abscess/ulcer of R transtibial amputation requiring IV abx  . AIDS ([place])   . Anemia   . CAD (coronary artery disease)    a. MI with stenting of OM1 in 11/2018 with residual disease. b. acute STEMI 03/2020 s/p DES to OM1  . Chronic anemia   . Chronic knee pain    right  . Chronic pain   . CKD (chronic kidney disease), stage IV ([place])   . Diabetes type 2, uncontrolled ([place])    HgA1c 17.6 (04/27/2010)  .  Diabetic foot ulcer ([place]) 01/2017   right foot  . Dilatation of aorta ([facility])   . Erectile dysfunction   . Genital warts   . HIV (human immunodeficiency virus infection) ([place]) 2009   CD4 count 100, VL 13800 (05/01/2010)  . Hyperlipidemia   . Hypertension   . Noncompliance with medication regimen   . Osteomyelitis ([place])    h/o hand  . Osteomyelitis of metatarsal ([place]) 04/28/2017     Family History  Problem Relation Age of Onset  . Hypertension Mother   . Arthritis Father   . Hypertension Father   . Hypertension Brother   . Cancer Maternal Grandmother 96       unknown type of cancer  . Depression Paternal Grandmother     Social History:  reports that he has never smoked. He has never used smokeless tobacco. He reports previous alcohol use. He reports previous drug use.   Prior to Admission medications   Medication Sig Start Date End Date Taking? Authorizing Provider  amLODipine (NORVASC) 10 MG tablet Take 1 tablet (10 mg total) by mouth daily. 03/27/20  Yes [name], MD  aspirin 81 MG EC tablet Take 1 tablet (81 mg total) by mouth daily. 03/27/20  Yes [name], MD  atorvastatin (LIPITOR) 80 MG tablet Take 1 tablet (80 mg total) by mouth daily at 6 PM. 03/27/20  Yes [name],  MD  BIKTARVY 50-200-25 MG TABS tablet TAKE 1 TABLET BY MOUTH DAILY. Patient taking differently: Take 1 tablet by mouth daily. 03/20/20  Yes [name], MD  carvedilol (COREG) 25 MG tablet Take 1 tablet (25 mg total) by mouth 2 (two) times daily with a meal. Please make appt for future refills. [phone]. 1st attempt. 03/27/20  Yes [name], MD  dapsone 100 MG tablet Take 1 tablet (100 mg total) by mouth daily. 03/28/20  Yes [name], Ava, DO  gabapentin (NEURONTIN) 100 MG capsule Take 1 capsule (100 mg total) by mouth 3 (three) times daily. 03/28/20  Yes [name], Ava, DO  icosapent Ethyl (VASCEPA) 1 g capsule Take 2 capsules (2 g total) by mouth 2 (two) times daily.  03/27/20  Yes [name], MD  insulin aspart (NOVOLOG FLEXPEN) 100 UNIT/ML FlexPen Inject 10 units under skin 3 times a day, with meals. Patient taking differently: Inject 20 Units into the skin 3 (three) times daily with meals. Inject 10 units under skin 3 times a day, with meals. 03/27/20  Yes [name], Ava, DO  LEVEMIR 100 UNIT/ML injection Inject 0.55 mLs (55 Units total) into the skin daily. Patient taking differently: Inject 50 Units into the skin at bedtime. 03/28/20 11/18/21 Yes [name], Ava, DO  nitroGLYCERIN (NITROSTAT) 0.4 MG SL tablet Place 1 tablet (0.4 mg total) under the tongue every 5 (five) minutes x 3 doses as needed for chest pain. 03/27/20  Yes [name], Ava, DO  oxybutynin (DITROPAN) 5 MG tablet Take 1 tablet (5 mg total) by mouth 2 (two) times daily. 03/27/20  Yes [name], MD  oxyCODONE 10 MG TABS Take 1 tablet (10 mg total) by mouth daily as needed for up to 5 doses for severe pain or breakthrough pain (pain). Patient taking differently: Take 10 mg by mouth 4 (four) times daily. 11/03/18  Yes Kayleen Memos, DO  ticagrelor (BRILINTA) 90 MG TABS tablet Take 1 tablet (90 mg total) by mouth 2 (two) times daily. 03/27/20  Yes [name], Ava, DO  blood glucose meter kit and supplies Dispense based on patient and insurance preference. Use up to four times daily as directed. (FOR ICD-10 E10.9, E11.9). 04/07/20   [name], FNP  EASY COMFORT PEN NEEDLES 31G X 5 MM MISC INJECT 20 UNITS DAILY AS DIRECTED. 11/10/19   [name], FNP  glucose blood (COOL BLOOD GLUCOSE TEST STRIPS) test strip Use as instructed 02/16/20   [name], FNP  Insulin Syringes, Disposable, U-100 0.3 ML MISC 1 Syringe by Does not apply route 3 (three) times daily. 02/16/20   [name], FNP  Lancets [facility] ULTRASOFT) lancets Use as instructed 02/16/20   [name], FNP    Exam: Current vital signs: BP 126/75 (BP Location: Right Arm)   Pulse 64   Temp 98.1 F (36.7  C) (Oral)   Resp 18   Ht _0  (1.753 m)   Wt 117.5 kg   SpO2 100%   BMI 38.25 kg/m   Physical Exam  Constitutional: Appears well-developed and well-nourished.  Psych: Affect appropriate to situation Eyes: No scleral injection HENT: No OP obstruction, edentulous. Head: Normocephalic.  Cardiovascular: Normal rate and regular rhythm.  Respiratory: Effort normal, symmetric excursions bilaterally, no audible wheezing. GI: Soft.  No distension. There is no tenderness.  Skin: WDI  Neuro: Mental Status: Patient is awake, alert, oriented to person, place, month, year, and situation. Patient is able to give a clear and coherent  history. No signs of aphasia or neglect. Cranial Nerves: II: Visual Fields are full. Pupils are equal, round, and reactive to light. III,IV, VI: EOMI without ptosis or diploplia.  V: Facial sensation is symmetric to temperature VII: Facial movement is symmetric.  VIII: hearing is intact to voice X: Uvula elevates symmetrically XI: Shoulder shrug is symmetric. XII: tongue is midline without atrophy or fasciculations.  Motor: Tone is normal. Bulk is normal with good strength present in all four extremities. Sensory: Sensation is symmetric to light touch and temperature in the arms and legs. Deep Tendon Reflexes: 2+ and symmetric in the biceps and patellae. Plantars: Toes are downgoing bilaterally. Cerebellar: FNF and HKS are intact bilaterally  I have reviewed labs in epic and the pertinent results are: Results for DERVIN, VORE (MRN [id number]) as of 04/26/2020 06:46  Ref. Range 04/25/2020 20:30  Total CHOL/HDL Ratio Latest Units: RATIO 8.8  Cholesterol Latest Ref Range: 0 - 200 mg/dL 280 (H)  HDL Cholesterol Latest Ref Range: >40 mg/dL 32 (L)  LDL (calc) Latest Ref Range: 0 - 99 mg/dL 205 (H)  Triglycerides Latest Ref Range: <150 mg/dL 217 (H)  VLDL Latest Ref Range: 0 - 40 mg/dL 43 (H)     Assessment: FERRELL CLAIBORNE is a 55 y.o. male PMHx  noted above recent CVA with acute right weakness found down. Exam is improved as compared to yesterday's exam and his deficits have resolved. He is currently on ticagrelor, aspirin, statin has been maximized and he is normotensive. His current therapy has been optimized.  Please cancel code stroke.   This patient is critically ill and at significant risk of neurological worsening, death and care requires constant monitoring of vital signs, hemodynamics,respiratory and cardiac monitoring, neurological assessment, discussion with family, other specialists and medical decision making of high complexity. I spent 70 minutes of neurocritical care time  in the care of  this patient. This was time spent independent of any time provided by nurse practitioner or PA.  Electronically signed by:  Lynnae Sandhoff, MD Page: [phone] 04/26/2020, 6:41 AM

## 2020-04-26 NOTE — Progress Notes (Addendum)
Physical Therapy Treatment Patient Details Name: Keith [name] MRN: [name] DOB: 1965/08/21 Today's Date: 04/26/2020    History of Present Illness Pt presents with L sided weakness UE and LE, facial droop, and slurred speech.  CT neg. MRI suspicious for CVA R pons. PMH - DM, CAD, HIV, rt BKA, CKD, back pain, HTN, diabetic neuropathy, STEMI s/p stent one mo ago.    PT Comments    Pt strongly desires to be independent and is unaware of his deficits that impact his safety. Upon recognizing his balance deficits along with L knee instability in standing he was more accepting of assistance from PT. Pt able to ambulate x4 bouts of ~2 ft > ~16 ft > ~16 ft > ~32 ft with a RW and R prosthetic donned, progressing from requiring modA to only needing minA to stabilize pt and manage RW, providing cues at L quad for stance phase. Focused remainder of session on increased L quads for decreased buckling with gait, placing R foot anterior to L with sit to stands from chair. Pt displayed fatigue in quads towards end of session and was educated to perform LAQ while up in chair as HEP. Will continue to follow acutely. Changed d/c recommendations to CIR as pt would greatly benefit from intensive therapy services to maximize his independence and safety with functional mobility and pt is highly motivated to return to his baseline.  Follow Up Recommendations  CIR;Supervision for mobility/OOB     Equipment Recommendations  Rolling walker with 5" wheels    Recommendations for Other Services       Precautions / Restrictions Precautions Precautions: Fall Precaution Comments: hx of R BKA Required Braces or Orthoses: Other Brace Other Brace: R prosthesis in room Restrictions Weight Bearing Restrictions: No    Mobility  Bed Mobility Overal bed mobility: Needs Assistance Bed Mobility: Supine to Sit     Supine to sit: HOB elevated;Min guard     General bed mobility comments: HOB elevated and min guard for  safety, requiring extra time to manage legs with cues to pull up on bed rail to ascend trunk.  Transfers Overall transfer level: Needs assistance Equipment used: Rolling walker (2 wheeled) Transfers: Sit to/from [facility] Sit to Stand: Min assist Stand pivot transfers: Mod assist       General transfer comment: MinA and extra time to power up to stand, 1x from EOB and 3x from recliner. Cues pt repeatedly for proper hand placement, but pt continued to attempt to pull up on RW. Stand step transfer to R EOB > recliner with RW requiring modA for balance and management of RW as pt displayed intermittently L knee buckle.  Ambulation/Gait Ambulation/Gait assistance: Mod assist Gait Distance (Feet): 32 Feet (x4 bouts of ~2 ft > ~16 ft > ~16 ft > ~32 ft, seated rest break between bouts) Assistive device: Rolling walker (2 wheeled) Gait Pattern/deviations: Decreased weight shift to left;Decreased stance time - left;Step-through pattern;Decreased step length - right;Decreased step length - left;Decreased stride length;Trunk flexed Gait velocity: decreased Gait velocity interpretation: <1.31 ft/sec, indicative of household ambulator General Gait Details: Pt initially resistant to assistance, but as pt noticed unsteadiness in L knee he was more accepting of assist. Pt progressed from requiring modA for stability and to provide cues/assist at L quad to extend knee with stance to only needing minA by the final bout. VCs to inc step length and manage RW with turns, success.   Stairs  Wheelchair Mobility    Modified Rankin (Stroke Patients Only) Modified Rankin (Stroke Patients Only) Pre-Morbid Rankin Score: No symptoms Modified Rankin: Moderately severe disability     Balance Overall balance assessment: Needs assistance Sitting-balance support: Feet supported;No upper extremity supported;Single extremity supported Sitting balance-Leahy Scale: Fair Sitting  balance - Comments: 1-no UE support, reaching off BOS to donn prosthesis and L sock, inc difficulty with L sock.   Standing balance support: During functional activity;Bilateral upper extremity supported Standing balance-Leahy Scale: Poor Standing balance comment: Reliant on UE support and external assist                            Cognition Arousal/Alertness: Awake/alert Behavior During Therapy: Flat affect Overall Cognitive Status: Impaired/Different from baseline Area of Impairment: Attention;Memory;Following commands;Safety/judgement;Awareness;Problem solving                   Current Attention Level: Sustained (Easily distracted by environment) Memory: Decreased short-term memory;Decreased recall of precautions Following Commands: Follows one step commands inconsistently;Follows one step commands with increased time Safety/Judgement: Decreased awareness of safety;Decreased awareness of deficits (tries to say he can walk without assistance) Awareness: Emergent Problem Solving: Slow processing;Requires verbal cues;Difficulty sequencing General Comments: Pt A&Ox4 with flat affect throughout, eaisly becoming distracted with phone and getting lunch. Pt with decreased awareness of deficits and safety stating he could walk indeendently, but did later acknowledge he was having difficulty and needed assistance for safety.      Exercises General Exercises - Lower Extremity Mini-Sqauts: Strengthening;Left;Other reps (comment) (x8 reps, sit to stand from recliner maintaining hands on recliner with R foot blocked to be placed more anterior to L)    General Comments General comments (skin integrity, edema, etc.): HEP verbally provided for LAQs      Pertinent Vitals/Pain Pain Assessment: Faces Faces Pain Scale: Hurts a little bit Pain Location: Generalized with mobility Pain Descriptors / Indicators: Discomfort;Grimacing Pain Intervention(s): Limited activity within  patient's tolerance;Monitored during session;Repositioned    Home Living                      Prior Function            PT Goals (current goals can now be found in the care plan section) Acute Rehab PT Goals Patient Stated Goal: To become independent again PT Goal Formulation: With patient Time For Goal Achievement: 05/09/20 Potential to Achieve Goals: Good Progress towards PT goals: Progressing toward goals    Frequency    Min 4X/week      PT Plan Discharge plan needs to be updated    Co-evaluation              AM-PAC PT "6 Clicks" Mobility   Outcome Measure  Help needed turning from your back to your side while in a flat bed without using bedrails?: A Little Help needed moving from lying on your back to sitting on the side of a flat bed without using bedrails?: A Little Help needed moving to and from a bed to a chair (including a wheelchair)?: A Lot Help needed standing up from a chair using your arms (e.g., wheelchair or bedside chair)?: A Little Help needed to walk in hospital room?: A Lot Help needed climbing 3-5 steps with a railing? : Total 6 Click Score: 14    End of Session Equipment Utilized During Treatment: Gait belt (initially resistant to gait belt but then agreed to it) Activity Tolerance:  Patient tolerated treatment well Patient left: with call bell/phone within reach;in chair;with chair alarm set (pt told to not get up; refused prosthesis to be removed or for pt to be reclined) Nurse Communication: Mobility status PT Visit Diagnosis: Unsteadiness on feet (R26.81);Hemiplegia and hemiparesis;Difficulty in walking, not elsewhere classified (R26.2);Other abnormalities of gait and mobility (R26.89);Muscle weakness (generalized) (M62.81);Other symptoms and signs involving the nervous system (R29.898) Hemiplegia - Right/Left: Left Hemiplegia - dominant/non-dominant: Non-dominant Hemiplegia - caused by: Cerebral infarction     Time:  [id number] PT Time Calculation (min) (ACUTE ONLY): 38 min  Charges:  $Gait Training: 23-37 mins $Therapeutic Exercise: 8-22 mins                     Moishe Spice, PT, DPT Acute Rehabilitation Services  Pager: [phone] Office: [place] 04/26/2020, 3:52 PM

## 2020-04-26 NOTE — Progress Notes (Addendum)
PROGRESS NOTE    Keith [name]  [id number] DOB: September 10, 1965 DOA: 04/25/2020 PCP: Keith Glatter, FNP    No chief complaint on file.   Brief Narrative:  Keith [name] is a 55 y.o. male with PMH significant for DM2, CKD 4, CAD status post recent stenting last month for ST elevation MI with history of diabetes mellitus type 2, right BKA, chronic anemia. Patient presented to the ED on 1/18 was brought to the ER after patientpatient states he took his insulin last night long-acting 55 units along with his Premeal insulin following which he did not eat. He started feeling weak on the left upper and lower extremity followed by left facial droop and slurred speech at around 8 PM. Did not have any weakness on the right side.  In the ER, patient was evaluated by neurologist CT head was unremarkable. Blood sugars were in the 60s. Patient passed swallow evaluation.  Labs are largely unremarkable and well at baseline.  MRI brain showed possible acute small vessel ischemia of the right pons with a nearby chronic right paracentral pontine lacunae.  No large vessel occlusion.  But there is evidence of atherosclerosis in the mid basilar artery, distal right ICA and right ACA A2.  Patient was admitted to hospitalist service. Neurology follow-up was obtained.   Assessment & Plan:   Principal Problem:   Left-sided weakness Active Problems:   Essential hypertension   Insulin-requiring or dependent type II diabetes mellitus (HCC)   HIV disease ([place])   Hypoglycemia   Cerebrovascular accident (CVA) ([place])   Type 2 diabetes mellitus with hyperlipidemia ([place])   PAD (peripheral artery disease) ([place])  1 acute CVA Patient has presented with left-sided weakness, slurred speech, left facial droop. CTA negative.   MRI with subtle acute small vessel ischemia of the right pons, with nearby chronic right paracentral pontine lacunar.  No hemorrhage or mass-effect. MRA head and neck with evidence of  atherosclerosis in the mid basilar artery, distal right ICA siphon, right ACA A2 segment.  No other posterior circulatory stenosis.  No cervical carotid stenosis. 2D echo with bubble study with EF of 50 to 55%, mild concentric LVH, grade 1 diastolic dysfunction, agitated saline contrast bubble study negative with no evidence of intra-arterial shunt. Patient seen in consultation by neurology recommending continuation of aspirin 81 mg daily, Brilinta 90 mg twice daily, statin.  PT/OT. Consulted inpatient rehab to see if patient is an appropriate candidate.  Supportive care.  Follow.  2.  Hyperlipidemia Fasting lipid panel with LDL of 205.  Goal LDL < 70.  Patient with some degree of noncompliance.  Importance of compliance stressed to patient.  Continue current dose of Lipitor 80 mg daily.  Follow-up.  3.  Hypertension Continue Norvasc, Coreg.  4.  Uncontrolled diabetes mellitus type 2 Hemoglobin A1c > 15.5.  CBG 185.  Increase Levemir to 40 units.  Place on NovoLog 6 units 3 times daily with meals.  Sliding scale insulin.  5.  Coronary artery disease status post stenting Stable.  Continue aspirin, Brilinta, statin.  Blood pressure management.  Medication compliance stressed to patient.  Outpatient follow-up with cardiology.  6.  Chronic kidney disease stage IV Stable.  7.  HIV Stable.  Continue dapsone and Biktarvy.  8.  Chronic anemia H&H stable.  Check anemia panel.  9.  Peripheral arterial disease/right BKA Continue aspirin, Brilinta, statin.  Risk factor modification.  Tight diabetes control.  10.  History of chronic pain Continue current pain regimen.  DVT prophylaxis: Lovenox Code Status: Full Family Communication: Updated patient.  No family at bedside. Disposition:   Status is: Inpatient    Dispo: The patient is from: Home              Anticipated d/c is to: CIR versus SNF              Anticipated d/c date is: 1 to 2 days              Patient currently unsafe to  be discharged home due to debility requiring discharge to inpatient rehab versus short-term SNF.       Consultants:   Neurology: Dr. Dr. Theda Sers 04/26/2020  Procedures:  MRI brain 04/25/2020  MRA head and neck 04/25/2021  CT head 04/25/2021  2D echo with bubble study 04/26/2020  Antimicrobials:    None   Subjective: Eating lunch. Events overnight noted of LUE weakness and slurred speech which have since improved. C/o weakness.  Objective: Vitals:   04/26/20 0357 04/26/20 0823 04/26/20 1124 04/26/20 1600  BP: 126/75 140/88 125/73 (!) 151/74  Pulse: 64 (!) 57 (!) 59 63  Resp:  16 (!) 22 20  Temp: 98.1 F (36.7 C) 97.6 F (36.4 C) 97.8 F (36.6 C) (!) 97.5 F (36.4 C)  TempSrc: Oral Oral Oral Oral  SpO2: 100% 100% 96% 100%  Weight:      Height:       No intake or output data in the 24 hours ending 04/26/20 1854 Filed Weights   04/25/20 0132 04/25/20 0134 04/25/20 2353  Weight: 117.9 kg 117.9 kg 117.5 kg    Examination:  General exam: Appears calm and comfortable. Respiratory system: Clear to auscultation. Respiratory effort normal. Cardiovascular system: S1 & S2 heard, RRR. No JVD, murmurs, rubs, gallops or clicks. No pedal edema. Gastrointestinal system: Abdomen is nondistended, soft and nontender. No organomegaly or masses felt. Normal bowel sounds heard. Central nervous system: Alert and oriented. No focal neurological deficits. Extremities: Status post right BKA. Skin: No rashes, lesions or ulcers Psychiatry: Judgement and insight appear normal. Mood & affect appropriate.     Data Reviewed: I have personally reviewed following labs and imaging studies  CBC: Recent Labs  Lab 04/25/20 0126 04/25/20 0128 04/25/20 0619 04/26/20 1228  WBC 7.0  --  7.5 5.2  NEUTROABS 3.0  --   --  2.5  HGB 11.1* 11.9* 10.6* 10.7*  HCT 36.9* 35.0* 34.9* 34.3*  MCV 86.6  --  86.8 84.5  PLT 272  --  246 [id number]    Basic Metabolic Panel: Recent Labs  Lab 04/25/20 0126  04/25/20 0128 04/25/20 0619 04/26/20 1228  NA 136 140  --  134*  K 4.0 4.1  --  4.1  CL 103 105  --  103  CO2 22  --   --  23  GLUCOSE 66* 65*  --  238*  BUN 30* 32*  --  32*  CREATININE 2.80* 2.90* 2.65* 2.30*  CALCIUM 9.6  --   --  9.1  MG  --   --   --  2.0    GFR: Estimated Creatinine Clearance: 45.9 mL/min (A) (by C-G formula based on SCr of 2.3 mg/dL (H)).  Liver Function Tests: Recent Labs  Lab 04/25/20 0126  AST 15  ALT 12  ALKPHOS 68  BILITOT 0.6  PROT 7.9  ALBUMIN 3.3*    CBG: Recent Labs  Lab 04/25/20 1709 04/25/20 2127 04/26/20 0632 04/26/20 1124 04/26/20 1727  GLUCAP  316* 273* 185* 228* 187*     Recent Results (from the past 240 hour(s))  Resp Panel by RT-PCR (Flu A&B, Covid) Nasopharyngeal Swab     Status: None   Collection Time: 04/25/20  4:35 AM   Specimen: Nasopharyngeal Swab; Nasopharyngeal(NP) swabs in vial transport medium  Result Value Ref Range Status   SARS Coronavirus 2 by RT PCR NEGATIVE NEGATIVE Final    Comment: (NOTE) SARS-CoV-2 target nucleic acids are NOT DETECTED.  The SARS-CoV-2 RNA is generally detectable in upper respiratory specimens during the acute phase of infection. The lowest concentration of SARS-CoV-2 viral copies this assay can detect is 138 copies/mL. A negative result does not preclude SARS-Cov-2 infection and should not be used as the sole basis for treatment or other patient management decisions. A negative result may occur with  improper specimen collection/handling, submission of specimen other than nasopharyngeal swab, presence of viral mutation(s) within the areas targeted by this assay, and inadequate number of viral copies(<138 copies/mL). A negative result must be combined with clinical observations, patient history, and epidemiological information. The expected result is Negative.  Fact Sheet for Patients:  EntrepreneurPulse.com.au  Fact Sheet for Healthcare Providers:   IncredibleEmployment.be  This test is no t yet approved or cleared by the [place] FDA and  has been authorized for detection and/or diagnosis of SARS-CoV-2 by FDA under an Emergency Use Authorization (EUA). This EUA will remain  in effect (meaning this test can be used) for the duration of the COVID-19 declaration under Section 564(b)(1) of the Act, 21 U.S.C.section 360bbb-3(b)(1), unless the authorization is terminated  or revoked sooner.       Influenza A by PCR NEGATIVE NEGATIVE Final   Influenza B by PCR NEGATIVE NEGATIVE Final    Comment: (NOTE) The Xpert Xpress SARS-CoV-2/FLU/RSV plus assay is intended as an aid in the diagnosis of influenza from Nasopharyngeal swab specimens and should not be used as a sole basis for treatment. Nasal washings and aspirates are unacceptable for Xpert Xpress SARS-CoV-2/FLU/RSV testing.  Fact Sheet for Patients: EntrepreneurPulse.com.au  Fact Sheet for Healthcare Providers: IncredibleEmployment.be  This test is not yet approved or cleared by the [place] FDA and has been authorized for detection and/or diagnosis of SARS-CoV-2 by FDA under an Emergency Use Authorization (EUA). This EUA will remain in effect (meaning this test can be used) for the duration of the COVID-19 declaration under Section 564(b)(1) of the Act, 21 U.S.C. section 360bbb-3(b)(1), unless the authorization is terminated or revoked.  Performed at [facility], [place] [address], [place], [place] [address]          Radiology Studies: MR ANGIO HEAD WO CONTRAST  Result Date: 04/25/2020 CLINICAL DATA:  55 year old male code stroke presentation. Left side weakness, slurred speech. EXAM: MRI HEAD WITHOUT CONTRAST MRA HEAD WITHOUT CONTRAST MRA NECK WITHOUT AND WITH CONTRAST TECHNIQUE: Multiplanar, multiecho pulse sequences of the brain and surrounding structures were obtained without intravenous  contrast. Angiographic images of the Circle of Willis were obtained using MRA technique without intravenous contrast. Angiographic images of the neck were obtained using MRA technique without and with intravenous contrast. Carotid stenosis measurements (when applicable) are obtained utilizing NASCET criteria, using the distal internal carotid diameter as the denominator. CONTRAST:  20m GADAVIST GADOBUTROL 1 MMOL/ML IV SOLN COMPARISON:  Plain head CT 0135 hours today, and earlier. FINDINGS: MRI HEAD FINDINGS Brain: Questionable punctate/vague diffusion restriction in the right pons (series 7, image 52). Nearby there is a chronic appearing right paracentral pontine  lacunar infarct (series 10, image 65). Otherwise No restricted diffusion to suggest acute infarction. No midline shift, mass effect, evidence of mass lesion, ventriculomegaly, extra-axial collection or acute intracranial hemorrhage. Cervicomedullary junction and pituitary are within normal limits. Outside of the right pons gray and white matter signal is largely normal for age throughout the brain. No cortical encephalomalacia or chronic cerebral blood products identified. Vascular: Major intracranial vascular flow voids are preserved. Skull and upper cervical spine: Negative. Sinuses/Orbits: Postoperative changes to both globes, bilateral chronic lamina papyracea fractures. Otherwise negative. Other: Mastoids are clear. Visible internal auditory structures appear normal. Visible scalp and face appear negative. MRA NECK FINDINGS Precontrast time-of-flight images demonstrate antegrade flow in both cervical carotid and vertebral arteries. The right vertebral artery appears slightly dominant. Carotid bifurcations appear within normal limits. Post-contrast neck MRA images reveal a 3 vessel arch configuration. Tortuous brachiocephalic artery. Normal right CCA aside from mild proximal tortuosity. Right carotid bifurcation is widely patent. Minimal irregularity of  the proximal right ICA. Otherwise negative cervical right ICA. Suggestion of mild to moderate supraclinoid ICA stenosis (series 1093, image 3). Patent right ICA terminus. Normal left CCA and left carotid bifurcation aside from some tortuosity. Cervical left ICA and visible left ICA siphon appear negative. Proximal subclavian arteries are patent without stenosis. Normal vertebral artery origins. Fairly codominant vertebral arteries are patent and within normal limits to the skull base. MRA HEAD FINDINGS Antegrade flow in the posterior circulation. Right V4 segment appears mildly dominant. Normal distal vertebral arteries and vertebrobasilar junction. Patent right PICA origin. The left AICA is patent and may be dominant. There is mild to moderate irregularity of the basilar artery although no significant stenosis (series 1053, image 11). Patent SCA and PCA origins. Posterior communicating arteries are diminutive or absent. Bilateral PCA branches are within normal limits. Antegrade flow in both ICA siphons. Normal left siphon. The right siphon appears normal to the supraclinoid segment where mild to moderate irregularity and mild stenosis is redemonstrated (series 1043, image 6). Patent bilateral carotid termini. Ophthalmic artery origins appear normal. Patent MCA and ACA origins. Diminutive anterior communicating artery. There is mild stenosis of the distal right ACA A2 segment. Visible left ACA branches are within normal limits. Left MCA M1 segment and trifurcation are patent without stenosis. Right MCA M1 segment and bifurcation are patent without stenosis. Visible bilateral MCA branches are within normal limits. IMPRESSION: 1. Suspicion of subtle acute small vessel ischemia in the Right Pons, with a nearby chronic right paracentral pontine lacune (see also #3). No associated hemorrhage or mass effect. 2. Otherwise normal for age non-contrast MRI appearance of the brain. 3. MRA Head and Neck is positive for evidence  of atherosclerosis in the: - mid Basilar Artery (mild stenosis), at the - distal Right ICA siphon (mild-to-moderate stenosis), and - Right ACA A2 segment (mild stenosis). No other posterior circulation stenosis. No cervical carotid stenosis. Electronically Signed   By: Genevie Ann M.D.   On: 04/25/2020 06:12   MR Angiogram Neck W or Wo Contrast  Result Date: 04/25/2020 CLINICAL DATA:  55 year old male code stroke presentation. Left side weakness, slurred speech. EXAM: MRI HEAD WITHOUT CONTRAST MRA HEAD WITHOUT CONTRAST MRA NECK WITHOUT AND WITH CONTRAST TECHNIQUE: Multiplanar, multiecho pulse sequences of the brain and surrounding structures were obtained without intravenous contrast. Angiographic images of the Circle of Willis were obtained using MRA technique without intravenous contrast. Angiographic images of the neck were obtained using MRA technique without and with intravenous contrast. Carotid stenosis measurements (when  applicable) are obtained utilizing NASCET criteria, using the distal internal carotid diameter as the denominator. CONTRAST:  13m GADAVIST GADOBUTROL 1 MMOL/ML IV SOLN COMPARISON:  Plain head CT 0135 hours today, and earlier. FINDINGS: MRI HEAD FINDINGS Brain: Questionable punctate/vague diffusion restriction in the right pons (series 7, image 52). Nearby there is a chronic appearing right paracentral pontine lacunar infarct (series 10, image 65). Otherwise No restricted diffusion to suggest acute infarction. No midline shift, mass effect, evidence of mass lesion, ventriculomegaly, extra-axial collection or acute intracranial hemorrhage. Cervicomedullary junction and pituitary are within normal limits. Outside of the right pons gray and white matter signal is largely normal for age throughout the brain. No cortical encephalomalacia or chronic cerebral blood products identified. Vascular: Major intracranial vascular flow voids are preserved. Skull and upper cervical spine: Negative.  Sinuses/Orbits: Postoperative changes to both globes, bilateral chronic lamina papyracea fractures. Otherwise negative. Other: Mastoids are clear. Visible internal auditory structures appear normal. Visible scalp and face appear negative. MRA NECK FINDINGS Precontrast time-of-flight images demonstrate antegrade flow in both cervical carotid and vertebral arteries. The right vertebral artery appears slightly dominant. Carotid bifurcations appear within normal limits. Post-contrast neck MRA images reveal a 3 vessel arch configuration. Tortuous brachiocephalic artery. Normal right CCA aside from mild proximal tortuosity. Right carotid bifurcation is widely patent. Minimal irregularity of the proximal right ICA. Otherwise negative cervical right ICA. Suggestion of mild to moderate supraclinoid ICA stenosis (series 1093, image 3). Patent right ICA terminus. Normal left CCA and left carotid bifurcation aside from some tortuosity. Cervical left ICA and visible left ICA siphon appear negative. Proximal subclavian arteries are patent without stenosis. Normal vertebral artery origins. Fairly codominant vertebral arteries are patent and within normal limits to the skull base. MRA HEAD FINDINGS Antegrade flow in the posterior circulation. Right V4 segment appears mildly dominant. Normal distal vertebral arteries and vertebrobasilar junction. Patent right PICA origin. The left AICA is patent and may be dominant. There is mild to moderate irregularity of the basilar artery although no significant stenosis (series 1053, image 11). Patent SCA and PCA origins. Posterior communicating arteries are diminutive or absent. Bilateral PCA branches are within normal limits. Antegrade flow in both ICA siphons. Normal left siphon. The right siphon appears normal to the supraclinoid segment where mild to moderate irregularity and mild stenosis is redemonstrated (series 1043, image 6). Patent bilateral carotid termini. Ophthalmic artery origins  appear normal. Patent MCA and ACA origins. Diminutive anterior communicating artery. There is mild stenosis of the distal right ACA A2 segment. Visible left ACA branches are within normal limits. Left MCA M1 segment and trifurcation are patent without stenosis. Right MCA M1 segment and bifurcation are patent without stenosis. Visible bilateral MCA branches are within normal limits. IMPRESSION: 1. Suspicion of subtle acute small vessel ischemia in the Right Pons, with a nearby chronic right paracentral pontine lacune (see also #3). No associated hemorrhage or mass effect. 2. Otherwise normal for age non-contrast MRI appearance of the brain. 3. MRA Head and Neck is positive for evidence of atherosclerosis in the: - mid Basilar Artery (mild stenosis), at the - distal Right ICA siphon (mild-to-moderate stenosis), and - Right ACA A2 segment (mild stenosis). No other posterior circulation stenosis. No cervical carotid stenosis. Electronically Signed   By: HGenevie AnnM.D.   On: 04/25/2020 06:12   MR BRAIN WO CONTRAST  Result Date: 04/25/2020 CLINICAL DATA:  55year old male code stroke presentation. Left side weakness, slurred speech. EXAM: MRI HEAD WITHOUT CONTRAST MRA HEAD WITHOUT  CONTRAST MRA NECK WITHOUT AND WITH CONTRAST TECHNIQUE: Multiplanar, multiecho pulse sequences of the brain and surrounding structures were obtained without intravenous contrast. Angiographic images of the Circle of Willis were obtained using MRA technique without intravenous contrast. Angiographic images of the neck were obtained using MRA technique without and with intravenous contrast. Carotid stenosis measurements (when applicable) are obtained utilizing NASCET criteria, using the distal internal carotid diameter as the denominator. CONTRAST:  63m GADAVIST GADOBUTROL 1 MMOL/ML IV SOLN COMPARISON:  Plain head CT 0135 hours today, and earlier. FINDINGS: MRI HEAD FINDINGS Brain: Questionable punctate/vague diffusion restriction in the right  pons (series 7, image 52). Nearby there is a chronic appearing right paracentral pontine lacunar infarct (series 10, image 65). Otherwise No restricted diffusion to suggest acute infarction. No midline shift, mass effect, evidence of mass lesion, ventriculomegaly, extra-axial collection or acute intracranial hemorrhage. Cervicomedullary junction and pituitary are within normal limits. Outside of the right pons gray and white matter signal is largely normal for age throughout the brain. No cortical encephalomalacia or chronic cerebral blood products identified. Vascular: Major intracranial vascular flow voids are preserved. Skull and upper cervical spine: Negative. Sinuses/Orbits: Postoperative changes to both globes, bilateral chronic lamina papyracea fractures. Otherwise negative. Other: Mastoids are clear. Visible internal auditory structures appear normal. Visible scalp and face appear negative. MRA NECK FINDINGS Precontrast time-of-flight images demonstrate antegrade flow in both cervical carotid and vertebral arteries. The right vertebral artery appears slightly dominant. Carotid bifurcations appear within normal limits. Post-contrast neck MRA images reveal a 3 vessel arch configuration. Tortuous brachiocephalic artery. Normal right CCA aside from mild proximal tortuosity. Right carotid bifurcation is widely patent. Minimal irregularity of the proximal right ICA. Otherwise negative cervical right ICA. Suggestion of mild to moderate supraclinoid ICA stenosis (series 1093, image 3). Patent right ICA terminus. Normal left CCA and left carotid bifurcation aside from some tortuosity. Cervical left ICA and visible left ICA siphon appear negative. Proximal subclavian arteries are patent without stenosis. Normal vertebral artery origins. Fairly codominant vertebral arteries are patent and within normal limits to the skull base. MRA HEAD FINDINGS Antegrade flow in the posterior circulation. Right V4 segment appears  mildly dominant. Normal distal vertebral arteries and vertebrobasilar junction. Patent right PICA origin. The left AICA is patent and may be dominant. There is mild to moderate irregularity of the basilar artery although no significant stenosis (series 1053, image 11). Patent SCA and PCA origins. Posterior communicating arteries are diminutive or absent. Bilateral PCA branches are within normal limits. Antegrade flow in both ICA siphons. Normal left siphon. The right siphon appears normal to the supraclinoid segment where mild to moderate irregularity and mild stenosis is redemonstrated (series 1043, image 6). Patent bilateral carotid termini. Ophthalmic artery origins appear normal. Patent MCA and ACA origins. Diminutive anterior communicating artery. There is mild stenosis of the distal right ACA A2 segment. Visible left ACA branches are within normal limits. Left MCA M1 segment and trifurcation are patent without stenosis. Right MCA M1 segment and bifurcation are patent without stenosis. Visible bilateral MCA branches are within normal limits. IMPRESSION: 1. Suspicion of subtle acute small vessel ischemia in the Right Pons, with a nearby chronic right paracentral pontine lacune (see also #3). No associated hemorrhage or mass effect. 2. Otherwise normal for age non-contrast MRI appearance of the brain. 3. MRA Head and Neck is positive for evidence of atherosclerosis in the: - mid Basilar Artery (mild stenosis), at the - distal Right ICA siphon (mild-to-moderate stenosis), and - Right ACA A2  segment (mild stenosis). No other posterior circulation stenosis. No cervical carotid stenosis. Electronically Signed   By: Genevie Ann M.D.   On: 04/25/2020 06:12   ECHOCARDIOGRAM COMPLETE BUBBLE STUDY  Result Date: 04/26/2020    ECHOCARDIOGRAM REPORT   Patient Name:   Keith [name] Date of Exam: 04/26/2020 Medical Rec #:  [id number]      Height:       69.0 in Accession #:    [id number]     Weight:       259.0 lb Date of Birth:   [birth year]      BSA:          2.306 m Patient Age:    62 years       BP:           140/88 mmHg Patient Gender: M              HR:           66 bpm. Exam Location:  Inpatient Procedure: 2D Echo, Cardiac Doppler, Color Doppler and [place] Contrast Bubble            Study Indications:    Stroke  History:        Patient has prior history of Echocardiogram examinations, most                 recent 05/24/2019. Previous Myocardial Infarction and CAD,                 Stroke; Risk Factors:Hypertension, Dyslipidemia and Diabetes.  Sonographer:    Roseanna Rainbow RDCS Referring Phys: [id number] [facility]  Sonographer Comments: Image acquisition challenging due to respiratory motion. IMPRESSIONS  1. Left ventricular ejection fraction, by estimation, is 50 to 55%. The left ventricle has low normal function. The left ventricle has no regional wall motion abnormalities. There is mild concentric left ventricular hypertrophy. Left ventricular diastolic parameters are consistent with Grade I diastolic dysfunction (impaired relaxation).  2. Right ventricular systolic function is normal. The right ventricular size is normal.  3. The mitral valve is normal in structure. Trivial mitral valve regurgitation. No evidence of mitral stenosis.  4. The aortic valve is tricuspid. Aortic valve regurgitation is not visualized. No aortic stenosis is present.  5. Aortic dilatation noted. There is mild dilatation of the ascending aorta, measuring 39 mm.  6. The inferior vena cava is normal in size with greater than 50% respiratory variability, suggesting right atrial pressure of 3 mmHg.  7. Agitated saline contrast bubble study was negative, with no evidence of any interatrial shunt. FINDINGS  Left Ventricle: Left ventricular ejection fraction, by estimation, is 50 to 55%. The left ventricle has low normal function. The left ventricle has no regional wall motion abnormalities. The left ventricular internal cavity size was normal in size. There is mild  concentric left ventricular hypertrophy. Left ventricular diastolic parameters are consistent with Grade I diastolic dysfunction (impaired relaxation). Indeterminate filling pressures. Right Ventricle: The right ventricular size is normal. No increase in right ventricular wall thickness. Right ventricular systolic function is normal. Left Atrium: Left atrial size was normal in size. Right Atrium: Right atrial size was normal in size. Pericardium: There is no evidence of pericardial effusion. Mitral Valve: The mitral valve is normal in structure. Trivial mitral valve regurgitation. No evidence of mitral valve stenosis. Tricuspid Valve: The tricuspid valve is normal in structure. Tricuspid valve regurgitation is not demonstrated. No evidence of tricuspid stenosis. Aortic Valve: The aortic valve is tricuspid. Aortic valve  regurgitation is not visualized. No aortic stenosis is present. Pulmonic Valve: The pulmonic valve was normal in structure. Pulmonic valve regurgitation is not visualized. No evidence of pulmonic stenosis. Aorta: Aortic dilatation noted. There is mild dilatation of the ascending aorta, measuring 39 mm. Venous: The inferior vena cava is normal in size with greater than 50% respiratory variability, suggesting right atrial pressure of 3 mmHg. IAS/Shunts: No atrial level shunt detected by color flow Doppler. Agitated saline contrast was given intravenously to evaluate for intracardiac shunting. Agitated saline contrast bubble study was negative, with no evidence of any interatrial shunt.  LEFT VENTRICLE PLAX 2D LVIDd:         4.50 cm      Diastology LVIDs:         3.30 cm      LV e' medial:    5.44 cm/s LV PW:         1.11 cm      LV E/e' medial:  13.9 LV IVS:        1.30 cm      LV e' lateral:   7.72 cm/s LVOT diam:     2.40 cm      LV E/e' lateral: 9.8 LV SV:         81 LV SV Index:   35 LVOT Area:     4.52 cm  LV Volumes (MOD) LV vol d, MOD A2C: 111.0 ml LV vol d, MOD A4C: 114.0 ml LV vol s, MOD A2C:  50.9 ml LV vol s, MOD A4C: 53.6 ml LV SV MOD A2C:     60.1 ml LV SV MOD A4C:     114.0 ml LV SV MOD BP:      61.7 ml RIGHT VENTRICLE            IVC RV S prime:     9.90 cm/s  IVC diam: 1.40 cm TAPSE (M-mode): 1.9 cm LEFT ATRIUM           Index       RIGHT ATRIUM           Index LA diam:      2.90 cm 1.26 cm/m  RA Area:     13.80 cm LA Vol (A2C): 38.7 ml 16.78 ml/m RA Volume:   29.90 ml  12.97 ml/m LA Vol (A4C): 31.1 ml 13.49 ml/m  AORTIC VALVE LVOT Vmax:   95.30 cm/s LVOT Vmean:  67.900 cm/s LVOT VTI:    0.178 m  AORTA Ao Root diam: 4.90 cm Ao Asc diam:  3.90 cm MITRAL VALVE MV Area (PHT): 2.24 cm    SHUNTS MV Decel Time: 338 msec    Systemic VTI:  0.18 m MV E velocity: 75.40 cm/s  Systemic Diam: 2.40 cm MV A velocity: 85.70 cm/s MV E/A ratio:  0.88 Skeet Latch MD Electronically signed by Skeet Latch MD Signature Date/Time: 04/26/2020/11:47:17 AM    Final    CT HEAD CODE STROKE WO CONTRAST  Result Date: 04/25/2020 CLINICAL DATA:  Code stroke. Initial evaluation for slurred speech, left-sided weakness. [place] side EXAM: CT HEAD WITHOUT CONTRAST TECHNIQUE: Contiguous axial images were obtained from the base of the skull through the vertex without intravenous contrast. COMPARISON:  Prior CT from 10/11/2018. FINDINGS: Brain: Cerebral volume within normal limits. No acute intracranial hemorrhage. No acute large vessel territory infarct. No mass lesion, midline shift or mass effect. No hydrocephalus or extra-axial fluid collection. Vascular: No hyperdense vessel. Skull: Scalp soft tissues and calvarium within normal limits.  Sinuses/Orbits: Globes and orbital soft tissues demonstrate no acute finding. Probable remote fractures noted involving the bilateral lamina papyracea. Mild mucosal thickening noted about the maxillary sinuses. Paranasal sinuses are otherwise clear. No mastoid effusion. Other: None. ASPECTS [facility] Stroke Program Early CT Score) - Ganglionic level infarction (caudate, lentiform  nuclei, internal capsule, insula, M1-M3 cortex): 7 - Supraganglionic infarction (M4-M6 cortex): 3 Total score (0-10 with 10 being normal): 10 IMPRESSION: 1. No acute intracranial infarct or other abnormality. 2. ASPECTS is 10. These results were communicated to Dr. Theda Sers at [place] 1/18/2022by text page via the [facility] messaging system. Electronically Signed   By: Jeannine Boga M.D.   On: 04/25/2020 01:57        Scheduled Meds: .  stroke: mapping our early stages of recovery book   Does not apply Once  . amLODipine  10 mg Oral Daily  . aspirin EC  81 mg Oral Daily  . atorvastatin  80 mg Oral q1800  . bictegravir-emtricitabine-tenofovir AF  1 tablet Oral Daily  . carvedilol  25 mg Oral BID WC  . dapsone  100 mg Oral Daily  . enoxaparin (LOVENOX) injection  40 mg Subcutaneous Q24H  . gabapentin  100 mg Oral TID  . icosapent Ethyl  2 g Oral BID  . insulin aspart  0-5 Units Subcutaneous QHS  . insulin aspart  0-9 Units Subcutaneous TID WC  . insulin aspart  6 Units Subcutaneous TID WC  . [START ON 04/27/2020] insulin detemir  40 Units Subcutaneous Daily  . oxybutynin  5 mg Oral BID  . ticagrelor  90 mg Oral BID   Continuous Infusions:   LOS: 0 days    Time spent: 35 minutes    Irine Seal, MD [facility]   To contact the attending provider between 7A-7P or the covering provider during after hours 7P-7A, please log into the web site [email or web address] and access using universal [place] password for that web site. If you do not have the password, please call the hospital operator.  04/26/2020, 6:54 PM

## 2020-04-27 ENCOUNTER — Inpatient Hospital Stay (HOSPITAL_COMMUNITY): Payer: Medicare HMO

## 2020-04-27 DIAGNOSIS — I10 Essential (primary) hypertension: Secondary | ICD-10-CM | POA: Diagnosis not present

## 2020-04-27 DIAGNOSIS — B20 Human immunodeficiency virus [HIV] disease: Secondary | ICD-10-CM | POA: Diagnosis not present

## 2020-04-27 DIAGNOSIS — R531 Weakness: Secondary | ICD-10-CM

## 2020-04-27 DIAGNOSIS — R479 Unspecified speech disturbances: Secondary | ICD-10-CM

## 2020-04-27 DIAGNOSIS — I639 Cerebral infarction, unspecified: Secondary | ICD-10-CM | POA: Diagnosis not present

## 2020-04-27 LAB — CBC
HCT: 33.9 % — ABNORMAL LOW (ref 39.0–52.0)
Hemoglobin: 10.8 g/dL — ABNORMAL LOW (ref 13.0–17.0)
MCH: 27.1 pg (ref 26.0–34.0)
MCHC: 31.9 g/dL (ref 30.0–36.0)
MCV: 85.2 fL (ref 80.0–100.0)
Platelets: 241 10*3/uL (ref 150–400)
RBC: 3.98 MIL/uL — ABNORMAL LOW (ref 4.22–5.81)
RDW: 14.6 % (ref 11.5–15.5)
WBC: 5.4 10*3/uL (ref 4.0–10.5)
nRBC: 0 % (ref 0.0–0.2)

## 2020-04-27 LAB — GLUCOSE, CAPILLARY
Glucose-Capillary: 504 mg/dL (ref 70–99)
Glucose-Capillary: 232 mg/dL — ABNORMAL HIGH (ref 70–99)
Glucose-Capillary: 254 mg/dL — ABNORMAL HIGH (ref 70–99)
Glucose-Capillary: 71 mg/dL (ref 70–99)
Glucose-Capillary: 157 mg/dL — ABNORMAL HIGH (ref 70–99)

## 2020-04-27 LAB — BASIC METABOLIC PANEL
Anion gap: 10 (ref 5–15)
BUN: 38 mg/dL — ABNORMAL HIGH (ref 6–20)
CO2: 20 mmol/L — ABNORMAL LOW (ref 22–32)
Calcium: 9.1 mg/dL (ref 8.9–10.3)
Chloride: 103 mmol/L (ref 98–111)
Creatinine, Ser: 2.73 mg/dL — ABNORMAL HIGH (ref 0.61–1.24)
GFR, Estimated: 27 mL/min — ABNORMAL LOW (ref >60)
Glucose, Bld: 278 mg/dL — ABNORMAL HIGH (ref 70–99)
Potassium: 4.7 mmol/L (ref 3.5–5.1)
Sodium: 133 mmol/L — ABNORMAL LOW (ref 135–145)

## 2020-04-27 LAB — MAGNESIUM: Magnesium: 2 mg/dL (ref 1.7–2.4)

## 2020-04-27 MED ORDER — INSULIN DETEMIR 100 UNIT/ML ~~LOC~~ SOLN
50.0000 [IU] | Freq: Every day | SUBCUTANEOUS | Status: DC
  Administered 2020-04-27: 50 [IU] via SUBCUTANEOUS
  Filled 2020-04-27: qty 0.5

## 2020-04-27 MED ORDER — INSULIN ASPART 100 UNIT/ML ~~LOC~~ SOLN
8.0000 [IU] | Freq: Three times a day (TID) | SUBCUTANEOUS | Status: DC
  Administered 2020-04-27 (×2): 8 [IU] via SUBCUTANEOUS

## 2020-04-27 MED ORDER — INSULIN DETEMIR 100 UNIT/ML ~~LOC~~ SOLN
40.0000 [IU] | Freq: Every day | SUBCUTANEOUS | Status: DC
  Administered 2020-04-28 – 2020-05-03 (×6): 40 [IU] via SUBCUTANEOUS
  Filled 2020-04-27 (×6): qty 0.4

## 2020-04-27 MED ORDER — SODIUM CHLORIDE 0.9 % IV SOLN: INTRAVENOUS | Status: DC

## 2020-04-27 NOTE — Progress Notes (Signed)
RN asked SLP to re-assess patient as pt with worsening stroke like symptoms and change in status since this am. Worsening dysarthria observed clinically as well as worsened left sided weakness. Per RN MDs aware, plans for re-imaging. Pt assessed with sip of water and overtly coughing. Recommend NPO with clinical swallow assessment next date.   [place], [place]

## 2020-04-27 NOTE — TOC Progression Note (Signed)
Transition of Care [facility]) - Progression Note    Patient Details  Name: Keith [name] MRN: [name] Date of Birth: Aug 19, 1965  [facility] CM/SW Contact  Pollie Friar, RN Phone Number: 04/27/2020, 3:00 PM  Clinical Narrative:    No bed offers for SNF rehab. CM called [facility] and they have not reviewed his information yet for rehab.  TOC following.   Expected Discharge Plan: Skilled Nursing Facility Barriers to Discharge: Continued Medical Work up  Expected Discharge Plan and Services Expected Discharge Plan: [place] In-house Referral: Clinical Social Work Discharge Planning Services: CM Consult Post Acute Care Choice: [place] arrangements for the past 2 months: Single Family Home                                       Social Determinants of Health (SDOH) Interventions    Readmission Risk Interventions Readmission Risk Prevention Plan 11/13/2018 11/13/2018 11/03/2018  Transportation Screening - Complete Complete  PCP or Specialist Appt within 3-5 Days - - Complete  HRI or Home Care Consult - - Complete  Social Work Consult for [place] Planning/Counseling - - Complete  Palliative Care Screening - - Not Applicable  Medication Review [employer] photographer) - Complete Complete  PCP or Specialist appointment within 3-5 days of discharge Complete - -  [place] or Home Care Consult Complete - -  SW Recovery Care/Counseling Consult Complete - -  Palliative Care Screening Not Applicable - -  [place] Not Applicable - -  Some recent data might be hidden

## 2020-04-27 NOTE — Consult Note (Signed)
Physical Medicine and Rehabilitation Consult Reason for Consult: Left side weakness with facial droop and slurred speech Referring Physician: [facility]   HPI: Keith [name] is a 55 y.o. 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  Per chart review patient lives with roommate.  1 level home 3 steps to entry.  Reportedly independent with prosthesis.  Presented 04/25/2020 with left-sided weakness facial droop and slurred speech.  CT/MRI suspicious of subtle acute small vessel ischemia in the right pons with a nearby chronic right paracentral pontine infarction.  Patient did not receive tPA.  MRA of head and neck positive for evidence of atherosclerosis in the mid basilar artery at the distal right ICA siphon and right ACA A2 segment.  Admission chemistries glucose 66 BUN 30 creatinine 2.80, hemoglobin 11.1, alcohol negative, urine drug screen negative.  Echocardiogram with ejection fraction of 50 to 55% no wall motion abnormality grade 1 diastolic dysfunction.  Neurology follow-up currently maintained on aspirin and Brilinta for CVA prophylaxis.  Subcutaneous Lovenox for DVT prophylaxis.  Therapy evaluations completed recommendations of physical medicine rehab consult secondary to left-sided weakness facial droop and slurred speech.  Pt reports living in boarding house- wondering "if had a stroke" and "how well he will do".   Review of Systems  Constitutional: Negative for chills and fever.  HENT: Negative for hearing loss.   Eyes: Negative for blurred vision and double vision.  Respiratory: Negative for cough and shortness of breath.   Cardiovascular: Positive for leg swelling. Negative for chest pain and palpitations.  Gastrointestinal: Positive for constipation. Negative for heartburn, nausea and vomiting.  Genitourinary: Negative for dysuria, flank pain and hematuria.   Musculoskeletal: Positive for joint pain, myalgias and neck pain.  Skin: Negative for rash.  Neurological: Positive for speech change and weakness.  All other systems reviewed and are negative.  Past Medical History:  Diagnosis Date  . Abscess of right foot    abscess/ulcer of R transtibial amputation requiring IV abx  . AIDS ([facility])   . Anemia   . CAD (coronary artery disease)    a. MI with stenting of OM1 in 11/2018 with residual disease. b. acute STEMI 03/2020 s/p DES to OM1  . Chronic anemia   . Chronic knee pain    right  . Chronic pain   . CKD (chronic kidney disease), stage IV ([facility])   . Diabetes type 2, uncontrolled ([facility])    HgA1c 17.6 (04/27/2010)  . Diabetic foot ulcer ([facility]) 01/2017   right foot  . Dilatation of aorta ([facility])   . Erectile dysfunction   . Genital warts   . HIV (human immunodeficiency virus infection) ([place] [facility]) 2009   CD4 count 100, VL 13800 (05/01/2010)  . Hyperlipidemia   . Hypertension   . Noncompliance with medication regimen   . Osteomyelitis ([facility])    h/o hand  . Osteomyelitis of metatarsal ([facility]) 04/28/2017   Past Surgical History:  Procedure Laterality Date  . AMPUTATION Right 05/02/2017   Procedure: AMPUTATION TRANSMETARSAL;  Surgeon: [name], MD;  Location: [facility];  Service: Orthopedics;  Laterality: Right;  . AMPUTATION Right 06/13/2017   Procedure: RIGHT BELOW KNEE AMPUTATION;  Surgeon: [name], MD;  Location: [facility];  Service: Orthopedics;  Laterality: Right;  . BELOW KNEE LEG AMPUTATION Right 06/13/2017  . BELOW KNEE LEG AMPUTATION    . CORONARY STENT INTERVENTION N/A 11/10/2018  Procedure: CORONARY STENT INTERVENTION;  Surgeon: Leonie Man, MD;  Location: [facility];  Service: Cardiovascular;  Laterality: N/A;  . CORONARY/GRAFT ACUTE MI REVASCULARIZATION N/A 03/21/2020   Procedure: Coronary/Graft Acute MI Revascularization;  Surgeon: [name], MD;  Location: [facility];  Service: Cardiovascular;   Laterality: N/A;  . HERNIA REPAIR    . I & D EXTREMITY Left 08/21/2014   Procedure: INCISION AND DRAINAGE LEFT SMALL FINGER;  Surgeon: Leanora Cover, MD;  Location: [facility];  Service: Orthopedics;  Laterality: Left;  . I & D EXTREMITY Right 03/18/2017   Procedure: IRRIGATION AND DEBRIDEMENT EXTREMITY;  Surgeon: [name], MD;  Location: [facility];  Service: Orthopedics;  Laterality: Right;  . LEFT HEART CATH AND CORONARY ANGIOGRAPHY N/A 11/10/2018   Procedure: LEFT HEART CATH AND CORONARY ANGIOGRAPHY;  Surgeon: Leonie Man, MD;  Location: [facility];  Service: Cardiovascular;  Laterality: N/A;  . LEFT HEART CATH AND CORONARY ANGIOGRAPHY N/A 03/21/2020   Procedure: LEFT HEART CATH AND CORONARY ANGIOGRAPHY;  Surgeon: [name], MD;  Location: [facility];  Service: Cardiovascular;  Laterality: N/A;  . MINOR IRRIGATION AND DEBRIDEMENT OF WOUND Right 04/22/2014   Procedure: IRRIGATION AND DEBRIDEMENT OF RIGHT NECK ABCESS;  Surgeon: Jerrell Belfast, MD;  Location: [facility];  Service: ENT;  Laterality: Right;  . MULTIPLE EXTRACTIONS WITH ALVEOLOPLASTY N/A 01/18/2013   Procedure: MULTIPLE EXTRACION 3, 6, 7, 10, 11, 13, 21, 22, 27, 28, 29, 30 WITH ALVEOLOPLASTY;  Surgeon: Gae Bon, DDS;  Location: [facility];  Service: Oral Surgery;  Laterality: N/A;  . SKIN SPLIT GRAFT Right 03/21/2017   Procedure: IRRIGATION AND DEBRIDEMENT RIGHT FOOT AND APPLY SPLIT THICKNESS SKIN GRAFT AND WOUND VAC;  Surgeon: [name], MD;  Location: [facility];  Service: Orthopedics;  Laterality: Right;  . TEE WITHOUT CARDIOVERSION N/A 05/02/2017   Procedure: TRANSESOPHAGEAL ECHOCARDIOGRAM (TEE);  Surgeon: Acie Fredrickson Wonda Cheng, MD;  Location: [facility];  Service: Cardiovascular;  Laterality: N/A;  coincidental to orthopedic case   Family History  Problem Relation Age of Onset  . Hypertension Mother   . Arthritis Father   . Hypertension Father   . Hypertension Brother   . Cancer Maternal Grandmother 4       unknown  type of cancer  . Depression Paternal Grandmother    Social History:  reports that he has never smoked. He has never used smokeless tobacco. He reports previous alcohol use. He reports previous drug use. Allergies:  Allergies  Allergen Reactions  . Elemental Sulfur Itching    Patient stated he's allergic to "sulfur" AND "sulfa"  . Sulfa Antibiotics Itching   Medications Prior to Admission  Medication Sig Dispense Refill  . amLODipine (NORVASC) 10 MG tablet Take 1 tablet (10 mg total) by mouth daily. 30 tablet 6  . aspirin 81 MG EC tablet Take 1 tablet (81 mg total) by mouth daily. 30 tablet 12  . atorvastatin (LIPITOR) 80 MG tablet Take 1 tablet (80 mg total) by mouth daily at 6 PM. 90 tablet 3  . BIKTARVY 50-200-25 MG TABS tablet TAKE 1 TABLET BY MOUTH DAILY. (Patient taking differently: Take 1 tablet by mouth daily.) 30 tablet 0  . carvedilol (COREG) 25 MG tablet Take 1 tablet (25 mg total) by mouth 2 (two) times daily with a meal. Please make appt for future refills. [phone]. 1st attempt. 60 tablet 6  . dapsone 100 MG tablet Take 1 tablet (100 mg  total) by mouth daily. 30 tablet 0  . gabapentin (NEURONTIN) 100 MG capsule Take 1 capsule (100 mg total) by mouth 3 (three) times daily. 90 capsule 0  . icosapent Ethyl (VASCEPA) 1 g capsule Take 2 capsules (2 g total) by mouth 2 (two) times daily. 120 capsule 3  . insulin aspart (NOVOLOG FLEXPEN) 100 UNIT/ML FlexPen Inject 10 units under skin 3 times a day, with meals. (Patient taking differently: Inject 20 Units into the skin 3 (three) times daily with meals. Inject 10 units under skin 3 times a day, with meals.) 15 mL 11  . LEVEMIR 100 UNIT/ML injection Inject 0.55 mLs (55 Units total) into the skin daily. (Patient taking differently: Inject 50 Units into the skin at bedtime.) 30 mL 3  . nitroGLYCERIN (NITROSTAT) 0.4 MG SL tablet Place 1 tablet (0.4 mg total) under the tongue every 5 (five) minutes x 3 doses as needed for chest pain. 14  tablet 12  . oxybutynin (DITROPAN) 5 MG tablet Take 1 tablet (5 mg total) by mouth 2 (two) times daily. 60 tablet 2  . oxyCODONE 10 MG TABS Take 1 tablet (10 mg total) by mouth daily as needed for up to 5 doses for severe pain or breakthrough pain (pain). (Patient taking differently: Take 10 mg by mouth 4 (four) times daily.) 5 tablet 0  . ticagrelor (BRILINTA) 90 MG TABS tablet Take 1 tablet (90 mg total) by mouth 2 (two) times daily. 60 tablet 0  . blood glucose meter kit and supplies Dispense based on patient and insurance preference. Use up to four times daily as directed. (FOR ICD-10 E10.9, E11.9). 1 each 0  . EASY COMFORT PEN NEEDLES 31G X 5 MM MISC INJECT 20 UNITS DAILY AS DIRECTED. 100 each 5  . glucose blood (COOL BLOOD GLUCOSE TEST STRIPS) test strip Use as instructed 100 each 0  . Insulin Syringes, Disposable, U-100 0.3 ML MISC 1 Syringe by Does not apply route 3 (three) times daily. 100 each 11  . Lancets (ONETOUCH ULTRASOFT) lancets Use as instructed 100 each 12    Home: Home Living Family/patient expects to be discharged to:: Private residence Living Arrangements: Non-relatives/Friends Available Help at Discharge: Friend(s),Available 24 hours/day Type of Home: House Home Access: Stairs to enter Technical brewer of Steps: 3 Entrance Stairs-Rails: [place]: One level Bathroom Shower/Tub: Chiropodist: Standard Home Equipment: Careers adviser History: Prior Function Level of Independence: Independent Comments: uses prosthetic Functional Status:  Mobility: Bed Mobility Overal bed mobility: Needs Assistance Bed Mobility: Supine to Sit Supine to sit: HOB elevated,Min guard Sit to supine: Min assist General bed mobility comments: HOB elevated and min guard for safety, requiring extra time to manage legs with cues to pull up on bed rail to ascend trunk. Transfers Overall transfer level: Needs assistance Equipment used: Rolling  walker (2 wheeled) Transfers: Sit to/from [employer] Sit to Stand: Min assist Stand pivot transfers: Mod assist General transfer comment: MinA and extra time to power up to stand, 1x from EOB and 3x from recliner. Cues pt repeatedly for proper hand placement, but pt continued to attempt to pull up on RW. Stand step transfer to R EOB > recliner with RW requiring modA for balance and management of RW as pt displayed intermittently L knee buckle. Ambulation/Gait Ambulation/Gait assistance: Mod assist Gait Distance (Feet): 32 Feet (x4 bouts of ~2 ft > ~16 ft > ~16 ft > ~32 ft, seated rest break between bouts) Assistive device: Rolling  walker (2 wheeled) Gait Pattern/deviations: Decreased weight shift to left,Decreased stance time - left,Step-through pattern,Decreased step length - right,Decreased step length - left,Decreased stride length,Trunk flexed General Gait Details: Pt initially resistant to assistance, but as pt noticed unsteadiness in L knee he was more accepting of assist. Pt progressed from requiring modA for stability and to provide cues/assist at L quad to extend knee with stance to only needing minA by the final bout. VCs to inc step length and manage RW with turns, success. Gait velocity: decreased Gait velocity interpretation: <1.31 ft/sec, indicative of household ambulator    ADL: ADL Overall ADL's : Needs assistance/impaired Eating/Feeding: Set up,Sitting Grooming: Set up,Supervision/safety,Sitting Upper Body Bathing: Set up,Supervision/ safety,Sitting Lower Body Bathing: +2 for safety/equipment,Sit to/from stand,Moderate assistance Upper Body Dressing : Set up,Supervision/safety,Sitting Lower Body Dressing: Sit to/from stand,Moderate assistance Lower Body Dressing Details (indicate cue type and reason): Pt donning his prothetic, Mod A +2 for balance in standing Toilet Transfer: Moderate assistance,+2 for physical assistance,Ambulation (simulated in  room) Functional mobility during ADLs: Moderate assistance,+2 for physical assistance General ADL Comments: Pt presenting with poor awareness, cognition, functional use of LUE/LLE, balance, and strength.  Cognition: Cognition Overall Cognitive Status: Impaired/Different from baseline Orientation Level: Oriented X4 Cognition Arousal/Alertness: Awake/alert Behavior During Therapy: Flat affect Overall Cognitive Status: Impaired/Different from baseline Area of Impairment: Attention,Memory,Following commands,Safety/judgement,Awareness,Problem solving Current Attention Level: Sustained (Easily distracted by environment) Memory: Decreased short-term memory,Decreased recall of precautions Following Commands: Follows one step commands inconsistently,Follows one step commands with increased time Safety/Judgement: Decreased awareness of safety,Decreased awareness of deficits (tries to say he can walk without assistance) Awareness: Emergent Problem Solving: Slow processing,Requires verbal cues,Difficulty sequencing General Comments: Pt A&Ox4 with flat affect throughout, eaisly becoming distracted with phone and getting lunch. Pt with decreased awareness of deficits and safety stating he could walk indeendently, but did later acknowledge he was having difficulty and needed assistance for safety.  Blood pressure (!) 160/98, pulse 60, temperature 97.7 F (36.5 C), temperature source Oral, resp. rate 18, height $RemoveBe'5\' 9"'[id number]$  (1.753 m), weight 117.5 kg, SpO2 100 %. Physical Exam Vitals and nursing note reviewed.  Constitutional:      Comments: Sitting up in bedside chair, leaning dramatically to right in chair, wearing R BKA prosthesis,  Ate 90% of lunch- no salad, NAD  HENT:     Head: Normocephalic and atraumatic.     Comments: L facial droop L tongue deviation notable    Right Ear: External ear normal.     Left Ear: External ear normal.     Nose: Nose normal. No congestion.     Mouth/Throat:     Mouth:  Mucous membranes are moist.     Pharynx: Oropharynx is clear. No oropharyngeal exudate.  Eyes:     General:        Right eye: No discharge.        Left eye: No discharge.     Extraocular Movements: Extraocular movements intact.     Comments: No nystagmus B/L  Cardiovascular:     Rate and Rhythm: Normal rate and regular rhythm.     Pulses: Normal pulses.     Heart sounds: Normal heart sounds. No murmur heard.   Pulmonary:     Comments: CTA B/L- no W/R/R- good air movement Abdominal:     Comments: Soft, NT, ND, (+)BS   Musculoskeletal:     Cervical back: Normal range of motion. No rigidity.     Comments: R strength 5/5 in biceps,triceps, WE, grip and finger  abd LUE- 0/5 except 1/5 L bicep RLE- HF and KE 5/5- wearing prosthesis LLE_ 0/5 in all muscles  Skin:    Comments: Cannot see stump of R BKA since wearing prosthesis Skin on both thighs VERY dry/scaly and also on arms IV L forearm- looks OK  Neurological:     Comments: Patient is alert in no acute distress.  Mild dysarthria.  Provides his name and age.  Follows simple commands.  Slow processing, but intact it appears upon meeting Ox3 Sensation to light touch intact in all 4 extremities  Psychiatric:     Comments: Appropriate, flat affect     Results for orders placed or performed during the hospital encounter of 04/25/20 (from the past 24 hour(s))  Glucose, capillary     Status: Abnormal   Collection Time: 04/26/20  6:32 AM  Result Value Ref Range   Glucose-Capillary 185 (H) 70 - 99 mg/dL  Glucose, capillary     Status: Abnormal   Collection Time: 04/26/20 11:24 AM  Result Value Ref Range   Glucose-Capillary 228 (H) 70 - 99 mg/dL  Basic metabolic panel     Status: Abnormal   Collection Time: 04/26/20 12:28 PM  Result Value Ref Range   Sodium 134 (L) 135 - 145 mmol/L   Potassium 4.1 3.5 - 5.1 mmol/L   Chloride 103 98 - 111 mmol/L   CO2 23 22 - 32 mmol/L   Glucose, Bld 238 (H) 70 - 99 mg/dL   BUN 32 (H) 6 - 20  mg/dL   Creatinine, Ser 2.30 (H) 0.61 - 1.24 mg/dL   Calcium 9.1 8.9 - 10.3 mg/dL   GFR, Estimated 33 (L) >60 mL/min   Anion gap 8 5 - 15  CBC with Differential/Platelet     Status: Abnormal   Collection Time: 04/26/20 12:28 PM  Result Value Ref Range   WBC 5.2 4.0 - 10.5 K/uL   RBC 4.06 (L) 4.22 - 5.81 MIL/uL   Hemoglobin 10.7 (L) 13.0 - 17.0 g/dL   HCT 34.3 (L) 39.0 - 52.0 %   MCV 84.5 80.0 - 100.0 fL   MCH 26.4 26.0 - 34.0 pg   MCHC 31.2 30.0 - 36.0 g/dL   RDW 14.3 11.5 - 15.5 %   Platelets 257 150 - 400 K/uL   nRBC 0.0 0.0 - 0.2 %   Neutrophils Relative % 48 %   Neutro Abs 2.5 1.7 - 7.7 K/uL   Lymphocytes Relative 40 %   Lymphs Abs 2.1 0.7 - 4.0 K/uL   Monocytes Relative 8 %   Monocytes Absolute 0.4 0.1 - 1.0 K/uL   Eosinophils Relative 3 %   Eosinophils Absolute 0.2 0.0 - 0.5 K/uL   Basophils Relative 1 %   Basophils Absolute 0.0 0.0 - 0.1 K/uL   Immature Granulocytes 0 %   Abs Immature Granulocytes 0.02 0.00 - 0.07 K/uL  Magnesium     Status: None   Collection Time: 04/26/20 12:28 PM  Result Value Ref Range   Magnesium 2.0 1.7 - 2.4 mg/dL  Vitamin B12     Status: None   Collection Time: 04/26/20 12:28 PM  Result Value Ref Range   Vitamin B-12 451 180 - 914 pg/mL  Folate     Status: None   Collection Time: 04/26/20 12:28 PM  Result Value Ref Range   Folate 8.7 >5.9 ng/mL  Iron and TIBC     Status: None   Collection Time: 04/26/20 12:28 PM  Result Value Ref Range  Iron 71 45 - 182 ug/dL   TIBC 276 250 - 450 ug/dL   Saturation Ratios 26 17.9 - 39.5 %   UIBC 205 ug/dL  Ferritin     Status: Abnormal   Collection Time: 04/26/20 12:28 PM  Result Value Ref Range   Ferritin 347 (H) 24 - 336 ng/mL  Reticulocytes     Status: Abnormal   Collection Time: 04/26/20 12:28 PM  Result Value Ref Range   Retic Ct Pct 1.8 0.4 - 3.1 %   RBC. 3.86 (L) 4.22 - 5.81 MIL/uL   Retic Count, Absolute 69.1 19.0 - 186.0 K/uL   Immature Retic Fract 18.1 (H) 2.3 - 15.9 %  Glucose,  capillary     Status: Abnormal   Collection Time: 04/26/20  5:27 PM  Result Value Ref Range   Glucose-Capillary 187 (H) 70 - 99 mg/dL  Glucose, capillary     Status: Abnormal   Collection Time: 04/26/20  9:10 PM  Result Value Ref Range   Glucose-Capillary 270 (H) 70 - 99 mg/dL   Comment 1 Notify RN    Comment 2 Document in Chart    MR ANGIO HEAD WO CONTRAST  Result Date: 04/25/2020 CLINICAL DATA:  55 year old male code stroke presentation. Left side weakness, slurred speech. EXAM: MRI HEAD WITHOUT CONTRAST MRA HEAD WITHOUT CONTRAST MRA NECK WITHOUT AND WITH CONTRAST TECHNIQUE: Multiplanar, multiecho pulse sequences of the brain and surrounding structures were obtained without intravenous contrast. Angiographic images of the Circle of Willis were obtained using MRA technique without intravenous contrast. Angiographic images of the neck were obtained using MRA technique without and with intravenous contrast. Carotid stenosis measurements (when applicable) are obtained utilizing NASCET criteria, using the distal internal carotid diameter as the denominator. CONTRAST:  54mL GADAVIST GADOBUTROL 1 MMOL/ML IV SOLN COMPARISON:  Plain head CT 0135 hours today, and earlier. FINDINGS: MRI HEAD FINDINGS Brain: Questionable punctate/vague diffusion restriction in the right pons (series 7, image 52). Nearby there is a chronic appearing right paracentral pontine lacunar infarct (series 10, image 65). Otherwise No restricted diffusion to suggest acute infarction. No midline shift, mass effect, evidence of mass lesion, ventriculomegaly, extra-axial collection or acute intracranial hemorrhage. Cervicomedullary junction and pituitary are within normal limits. Outside of the right pons gray and white matter signal is largely normal for age throughout the brain. No cortical encephalomalacia or chronic cerebral blood products identified. Vascular: Major intracranial vascular flow voids are preserved. Skull and upper cervical  spine: Negative. Sinuses/Orbits: Postoperative changes to both globes, bilateral chronic lamina papyracea fractures. Otherwise negative. Other: Mastoids are clear. Visible internal auditory structures appear normal. Visible scalp and face appear negative. MRA NECK FINDINGS Precontrast time-of-flight images demonstrate antegrade flow in both cervical carotid and vertebral arteries. The right vertebral artery appears slightly dominant. Carotid bifurcations appear within normal limits. Post-contrast neck MRA images reveal a 3 vessel arch configuration. Tortuous brachiocephalic artery. Normal right CCA aside from mild proximal tortuosity. Right carotid bifurcation is widely patent. Minimal irregularity of the proximal right ICA. Otherwise negative cervical right ICA. Suggestion of mild to moderate supraclinoid ICA stenosis (series 1093, image 3). Patent right ICA terminus. Normal left CCA and left carotid bifurcation aside from some tortuosity. Cervical left ICA and visible left ICA siphon appear negative. Proximal subclavian arteries are patent without stenosis. Normal vertebral artery origins. Fairly codominant vertebral arteries are patent and within normal limits to the skull base. MRA HEAD FINDINGS Antegrade flow in the posterior circulation. Right V4 segment appears mildly dominant. Normal distal  vertebral arteries and vertebrobasilar junction. Patent right PICA origin. The left AICA is patent and may be dominant. There is mild to moderate irregularity of the basilar artery although no significant stenosis (series 1053, image 11). Patent SCA and PCA origins. Posterior communicating arteries are diminutive or absent. Bilateral PCA branches are within normal limits. Antegrade flow in both ICA siphons. Normal left siphon. The right siphon appears normal to the supraclinoid segment where mild to moderate irregularity and mild stenosis is redemonstrated (series 1043, image 6). Patent bilateral carotid termini.  Ophthalmic artery origins appear normal. Patent MCA and ACA origins. Diminutive anterior communicating artery. There is mild stenosis of the distal right ACA A2 segment. Visible left ACA branches are within normal limits. Left MCA M1 segment and trifurcation are patent without stenosis. Right MCA M1 segment and bifurcation are patent without stenosis. Visible bilateral MCA branches are within normal limits. IMPRESSION: 1. Suspicion of subtle acute small vessel ischemia in the Right Pons, with a nearby chronic right paracentral pontine lacune (see also #3). No associated hemorrhage or mass effect. 2. Otherwise normal for age non-contrast MRI appearance of the brain. 3. MRA Head and Neck is positive for evidence of atherosclerosis in the: - mid Basilar Artery (mild stenosis), at the - distal Right ICA siphon (mild-to-moderate stenosis), and - Right ACA A2 segment (mild stenosis). No other posterior circulation stenosis. No cervical carotid stenosis. Electronically Signed   By: Genevie Ann M.D.   On: 04/25/2020 06:12   MR Angiogram Neck W or Wo Contrast  Result Date: 04/25/2020 CLINICAL DATA:  55 year old male code stroke presentation. Left side weakness, slurred speech. EXAM: MRI HEAD WITHOUT CONTRAST MRA HEAD WITHOUT CONTRAST MRA NECK WITHOUT AND WITH CONTRAST TECHNIQUE: Multiplanar, multiecho pulse sequences of the brain and surrounding structures were obtained without intravenous contrast. Angiographic images of the Circle of Willis were obtained using MRA technique without intravenous contrast. Angiographic images of the neck were obtained using MRA technique without and with intravenous contrast. Carotid stenosis measurements (when applicable) are obtained utilizing NASCET criteria, using the distal internal carotid diameter as the denominator. CONTRAST:  70mL GADAVIST GADOBUTROL 1 MMOL/ML IV SOLN COMPARISON:  Plain head CT 0135 hours today, and earlier. FINDINGS: MRI HEAD FINDINGS Brain: Questionable  punctate/vague diffusion restriction in the right pons (series 7, image 52). Nearby there is a chronic appearing right paracentral pontine lacunar infarct (series 10, image 65). Otherwise No restricted diffusion to suggest acute infarction. No midline shift, mass effect, evidence of mass lesion, ventriculomegaly, extra-axial collection or acute intracranial hemorrhage. Cervicomedullary junction and pituitary are within normal limits. Outside of the right pons gray and white matter signal is largely normal for age throughout the brain. No cortical encephalomalacia or chronic cerebral blood products identified. Vascular: Major intracranial vascular flow voids are preserved. Skull and upper cervical spine: Negative. Sinuses/Orbits: Postoperative changes to both globes, bilateral chronic lamina papyracea fractures. Otherwise negative. Other: Mastoids are clear. Visible internal auditory structures appear normal. Visible scalp and face appear negative. MRA NECK FINDINGS Precontrast time-of-flight images demonstrate antegrade flow in both cervical carotid and vertebral arteries. The right vertebral artery appears slightly dominant. Carotid bifurcations appear within normal limits. Post-contrast neck MRA images reveal a 3 vessel arch configuration. Tortuous brachiocephalic artery. Normal right CCA aside from mild proximal tortuosity. Right carotid bifurcation is widely patent. Minimal irregularity of the proximal right ICA. Otherwise negative cervical right ICA. Suggestion of mild to moderate supraclinoid ICA stenosis (series 1093, image 3). Patent right ICA terminus. Normal left CCA  and left carotid bifurcation aside from some tortuosity. Cervical left ICA and visible left ICA siphon appear negative. Proximal subclavian arteries are patent without stenosis. Normal vertebral artery origins. Fairly codominant vertebral arteries are patent and within normal limits to the skull base. MRA HEAD FINDINGS Antegrade flow in the  posterior circulation. Right V4 segment appears mildly dominant. Normal distal vertebral arteries and vertebrobasilar junction. Patent right PICA origin. The left AICA is patent and may be dominant. There is mild to moderate irregularity of the basilar artery although no significant stenosis (series 1053, image 11). Patent SCA and PCA origins. Posterior communicating arteries are diminutive or absent. Bilateral PCA branches are within normal limits. Antegrade flow in both ICA siphons. Normal left siphon. The right siphon appears normal to the supraclinoid segment where mild to moderate irregularity and mild stenosis is redemonstrated (series 1043, image 6). Patent bilateral carotid termini. Ophthalmic artery origins appear normal. Patent MCA and ACA origins. Diminutive anterior communicating artery. There is mild stenosis of the distal right ACA A2 segment. Visible left ACA branches are within normal limits. Left MCA M1 segment and trifurcation are patent without stenosis. Right MCA M1 segment and bifurcation are patent without stenosis. Visible bilateral MCA branches are within normal limits. IMPRESSION: 1. Suspicion of subtle acute small vessel ischemia in the Right Pons, with a nearby chronic right paracentral pontine lacune (see also #3). No associated hemorrhage or mass effect. 2. Otherwise normal for age non-contrast MRI appearance of the brain. 3. MRA Head and Neck is positive for evidence of atherosclerosis in the: - mid Basilar Artery (mild stenosis), at the - distal Right ICA siphon (mild-to-moderate stenosis), and - Right ACA A2 segment (mild stenosis). No other posterior circulation stenosis. No cervical carotid stenosis. Electronically Signed   By: Genevie Ann M.D.   On: 04/25/2020 06:12   MR BRAIN WO CONTRAST  Result Date: 04/25/2020 CLINICAL DATA:  55 year old male code stroke presentation. Left side weakness, slurred speech. EXAM: MRI HEAD WITHOUT CONTRAST MRA HEAD WITHOUT CONTRAST MRA NECK WITHOUT  AND WITH CONTRAST TECHNIQUE: Multiplanar, multiecho pulse sequences of the brain and surrounding structures were obtained without intravenous contrast. Angiographic images of the Circle of Willis were obtained using MRA technique without intravenous contrast. Angiographic images of the neck were obtained using MRA technique without and with intravenous contrast. Carotid stenosis measurements (when applicable) are obtained utilizing NASCET criteria, using the distal internal carotid diameter as the denominator. CONTRAST:  19mL GADAVIST GADOBUTROL 1 MMOL/ML IV SOLN COMPARISON:  Plain head CT 0135 hours today, and earlier. FINDINGS: MRI HEAD FINDINGS Brain: Questionable punctate/vague diffusion restriction in the right pons (series 7, image 52). Nearby there is a chronic appearing right paracentral pontine lacunar infarct (series 10, image 65). Otherwise No restricted diffusion to suggest acute infarction. No midline shift, mass effect, evidence of mass lesion, ventriculomegaly, extra-axial collection or acute intracranial hemorrhage. Cervicomedullary junction and pituitary are within normal limits. Outside of the right pons gray and white matter signal is largely normal for age throughout the brain. No cortical encephalomalacia or chronic cerebral blood products identified. Vascular: Major intracranial vascular flow voids are preserved. Skull and upper cervical spine: Negative. Sinuses/Orbits: Postoperative changes to both globes, bilateral chronic lamina papyracea fractures. Otherwise negative. Other: Mastoids are clear. Visible internal auditory structures appear normal. Visible scalp and face appear negative. MRA NECK FINDINGS Precontrast time-of-flight images demonstrate antegrade flow in both cervical carotid and vertebral arteries. The right vertebral artery appears slightly dominant. Carotid bifurcations appear within normal limits. Post-contrast  neck MRA images reveal a 3 vessel arch configuration. Tortuous  brachiocephalic artery. Normal right CCA aside from mild proximal tortuosity. Right carotid bifurcation is widely patent. Minimal irregularity of the proximal right ICA. Otherwise negative cervical right ICA. Suggestion of mild to moderate supraclinoid ICA stenosis (series 1093, image 3). Patent right ICA terminus. Normal left CCA and left carotid bifurcation aside from some tortuosity. Cervical left ICA and visible left ICA siphon appear negative. Proximal subclavian arteries are patent without stenosis. Normal vertebral artery origins. Fairly codominant vertebral arteries are patent and within normal limits to the skull base. MRA HEAD FINDINGS Antegrade flow in the posterior circulation. Right V4 segment appears mildly dominant. Normal distal vertebral arteries and vertebrobasilar junction. Patent right PICA origin. The left AICA is patent and may be dominant. There is mild to moderate irregularity of the basilar artery although no significant stenosis (series 1053, image 11). Patent SCA and PCA origins. Posterior communicating arteries are diminutive or absent. Bilateral PCA branches are within normal limits. Antegrade flow in both ICA siphons. Normal left siphon. The right siphon appears normal to the supraclinoid segment where mild to moderate irregularity and mild stenosis is redemonstrated (series 1043, image 6). Patent bilateral carotid termini. Ophthalmic artery origins appear normal. Patent MCA and ACA origins. Diminutive anterior communicating artery. There is mild stenosis of the distal right ACA A2 segment. Visible left ACA branches are within normal limits. Left MCA M1 segment and trifurcation are patent without stenosis. Right MCA M1 segment and bifurcation are patent without stenosis. Visible bilateral MCA branches are within normal limits. IMPRESSION: 1. Suspicion of subtle acute small vessel ischemia in the Right Pons, with a nearby chronic right paracentral pontine lacune (see also #3). No  associated hemorrhage or mass effect. 2. Otherwise normal for age non-contrast MRI appearance of the brain. 3. MRA Head and Neck is positive for evidence of atherosclerosis in the: - mid Basilar Artery (mild stenosis), at the - distal Right ICA siphon (mild-to-moderate stenosis), and - Right ACA A2 segment (mild stenosis). No other posterior circulation stenosis. No cervical carotid stenosis. Electronically Signed   By: Genevie Ann M.D.   On: 04/25/2020 06:12   ECHOCARDIOGRAM COMPLETE BUBBLE STUDY  Result Date: 04/26/2020    ECHOCARDIOGRAM REPORT   Patient Name:   Keith [name] Date of Exam: 04/26/2020 Medical Rec #:  [id number]      Height:       69.0 in Accession #:    [id number]     Weight:       259.0 lb Date of Birth:  [birth year]      BSA:          2.306 m Patient Age:    11 years       BP:           140/88 mmHg Patient Gender: M              HR:           66 bpm. Exam Location:  Inpatient Procedure: 2D Echo, Cardiac Doppler, Color Doppler and Saline Contrast Bubble            Study Indications:    Stroke  History:        Patient has prior history of Echocardiogram examinations, most                 recent 05/24/2019. Previous Myocardial Infarction and CAD,  Stroke; Risk Factors:Hypertension, Dyslipidemia and Diabetes.  Sonographer:    Roseanna Rainbow RDCS Referring Phys: [id number] [facility]  Sonographer Comments: Image acquisition challenging due to respiratory motion. IMPRESSIONS  1. Left ventricular ejection fraction, by estimation, is 50 to 55%. The left ventricle has low normal function. The left ventricle has no regional wall motion abnormalities. There is mild concentric left ventricular hypertrophy. Left ventricular diastolic parameters are consistent with Grade I diastolic dysfunction (impaired relaxation).  2. Right ventricular systolic function is normal. The right ventricular size is normal.  3. The mitral valve is normal in structure. Trivial mitral valve regurgitation. No evidence of  mitral stenosis.  4. The aortic valve is tricuspid. Aortic valve regurgitation is not visualized. No aortic stenosis is present.  5. Aortic dilatation noted. There is mild dilatation of the ascending aorta, measuring 39 mm.  6. The inferior vena cava is normal in size with greater than 50% respiratory variability, suggesting right atrial pressure of 3 mmHg.  7. Agitated saline contrast bubble study was negative, with no evidence of any interatrial shunt. FINDINGS  Left Ventricle: Left ventricular ejection fraction, by estimation, is 50 to 55%. The left ventricle has low normal function. The left ventricle has no regional wall motion abnormalities. The left ventricular internal cavity size was normal in size. There is mild concentric left ventricular hypertrophy. Left ventricular diastolic parameters are consistent with Grade I diastolic dysfunction (impaired relaxation). Indeterminate filling pressures. Right Ventricle: The right ventricular size is normal. No increase in right ventricular wall thickness. Right ventricular systolic function is normal. Left Atrium: Left atrial size was normal in size. Right Atrium: Right atrial size was normal in size. Pericardium: There is no evidence of pericardial effusion. Mitral Valve: The mitral valve is normal in structure. Trivial mitral valve regurgitation. No evidence of mitral valve stenosis. Tricuspid Valve: The tricuspid valve is normal in structure. Tricuspid valve regurgitation is not demonstrated. No evidence of tricuspid stenosis. Aortic Valve: The aortic valve is tricuspid. Aortic valve regurgitation is not visualized. No aortic stenosis is present. Pulmonic Valve: The pulmonic valve was normal in structure. Pulmonic valve regurgitation is not visualized. No evidence of pulmonic stenosis. Aorta: Aortic dilatation noted. There is mild dilatation of the ascending aorta, measuring 39 mm. Venous: The inferior vena cava is normal in size with greater than 50% respiratory  variability, suggesting right atrial pressure of 3 mmHg. IAS/Shunts: No atrial level shunt detected by color flow Doppler. Agitated saline contrast was given intravenously to evaluate for intracardiac shunting. Agitated saline contrast bubble study was negative, with no evidence of any interatrial shunt.  LEFT VENTRICLE PLAX 2D LVIDd:         4.50 cm      Diastology LVIDs:         3.30 cm      LV e' medial:    5.44 cm/s LV PW:         1.11 cm      LV E/e' medial:  13.9 LV IVS:        1.30 cm      LV e' lateral:   7.72 cm/s LVOT diam:     2.40 cm      LV E/e' lateral: 9.8 LV SV:         81 LV SV Index:   35 LVOT Area:     4.52 cm  LV Volumes (MOD) LV vol d, MOD A2C: 111.0 ml LV vol d, MOD A4C: 114.0 ml LV vol s, MOD A2C:  50.9 ml LV vol s, MOD A4C: 53.6 ml LV SV MOD A2C:     60.1 ml LV SV MOD A4C:     114.0 ml LV SV MOD BP:      61.7 ml RIGHT VENTRICLE            IVC RV S prime:     9.90 cm/s  IVC diam: 1.40 cm TAPSE (M-mode): 1.9 cm LEFT ATRIUM           Index       RIGHT ATRIUM           Index LA diam:      2.90 cm 1.26 cm/m  RA Area:     13.80 cm LA Vol (A2C): 38.7 ml 16.78 ml/m RA Volume:   29.90 ml  12.97 ml/m LA Vol (A4C): 31.1 ml 13.49 ml/m  AORTIC VALVE LVOT Vmax:   95.30 cm/s LVOT Vmean:  67.900 cm/s LVOT VTI:    0.178 m  AORTA Ao Root diam: 4.90 cm Ao Asc diam:  3.90 cm MITRAL VALVE MV Area (PHT): 2.24 cm    SHUNTS MV Decel Time: 338 msec    Systemic VTI:  0.18 m MV E velocity: 75.40 cm/s  Systemic Diam: 2.40 cm MV A velocity: 85.70 cm/s MV E/A ratio:  0.88 Chilton Si MD Electronically signed by Chilton Si MD Signature Date/Time: 04/26/2020/11:47:17 AM    Final      Assessment/Plan: Diagnosis: R pons CVA with L hemiplegia 1. Does the need for close, 24 hr/day medical supervision in concert with the patient's rehab needs make it unreasonable for this patient to be served in a less intensive setting? Yes 2. Co-Morbidities requiring supervision/potential complications: R BKA, DM, recent  MI/CAD s/p stent; CKD stage IV, A1c >15.5- very out of control 3. Due to bladder management, bowel management, safety, skin/wound care, disease management, medication administration, pain management and patient education, does the patient require 24 hr/day rehab nursing? Potentially 4. Does the patient require coordinated care of a physician, rehab nurse, therapy disciplines of PT, OT and maybe SLP to address physical and functional deficits in the context of the above medical diagnosis(es)? Potentially Addressing deficits in the following areas: balance, endurance, locomotion, strength, transferring, bowel/bladder control, bathing, dressing, feeding, grooming, toileting and swallowing 5. Can the patient actively participate in an intensive therapy program of at least 3 hrs of therapy per day at least 5 days per week? Potentially 6. The potential for patient to make measurable gains while on inpatient rehab is fair 7. Anticipated functional outcomes upon discharge from inpatient rehab are supervision and min assist  with PT, supervision and min assist with OT, modified independent and supervision with SLP. 8. Estimated rehab length of stay to reach the above functional goals is: 2-3 weeks 9. Anticipated discharge destination: Home 10. Overall Rehab/Functional Prognosis: fair  RECOMMENDATIONS: This patient's condition is appropriate for continued rehabilitative care in the following setting: CIR and SNF Patient has agreed to participate in recommended program. Potentially Note that insurance prior authorization may be required for reimbursement for recommended care.  Comment:  1. Pt would be good rehab candidate but  Lives in boarding house- not sure if he has family to take care of him after d/c.  2. Will submit for inpt rehab, will d/w admissions coordinators,  3. Thank you for this consult.    I have personally performed a face to face diagnostic evaluation of this patient and formulated the  key components of the plan.  Additionally, I have personally reviewed laboratory data, imaging studies, as well as relevant notes and concur with the physician assistant's documentation above.      Lavon Paganini Angiulli, PA-C 04/27/2020

## 2020-04-27 NOTE — Progress Notes (Signed)
Physical Therapy Treatment Patient Details Name: Keith [name] MRN: [name] DOB: Jul 11, 1965 Today's Date: 04/27/2020    History of Present Illness Pt presents with L sided weakness UE and LE, facial droop, and slurred speech.  CT neg. MRI suspicious for CVA R pons. PMH - DM, CAD, HIV, rt BKA, CKD, back pain, HTN, diabetic neuropathy, STEMI s/p stent one mo ago.    PT Comments    Pt demonstrates a significant decline in L side strength and overall functional mobility this date compared to his session yesterday. Yesterday he was able to ambulate up to ~32 ft with min-modAx1 and a RW whereas today he could not even clear his buttocks to come to stand from elevated EOB even with maxA. Pt barely made it to chair performing a squat pivot to the R with maxA and L knee block as his L leg is significantly weaker with minimal activation noted this date resulting in knee buckling and LOB. Pt is at high risk for falls and injury and has poor awareness of his deficits. In addition, pt's sitting balance has declined to him requiring R UE support to maintain his static sitting balance otherwise he would display LOB, indicating a decline in trunk motor control also. His L UE is also weaker in that he cannot hold it against gravity and minimally squeezes his hand today but yesterday he was using his L hand to assist with donning his prosthesis and shoe. RN immediately notified of these significant changes and MDs notified as well. Will continue to follow acutely. Current recommendations remain appropriate.   Follow Up Recommendations  CIR;Supervision for mobility/OOB     Equipment Recommendations  Rolling walker with 5" wheels    Recommendations for Other Services       Precautions / Restrictions Precautions Precautions: Fall Precaution Comments: hx of R BKA Required Braces or Orthoses: Other Brace Other Brace: R prosthesis in room Restrictions Weight Bearing Restrictions: No    Mobility  Bed  Mobility Overal bed mobility: Needs Assistance Bed Mobility: Supine to Sit     Supine to sit: HOB elevated;Mod assist     General bed mobility comments: Cued pt to slide L leg off R EOB, min activation noted. Cued pt to reach L hand for R bed rail, again min activation noted and thus hand-over-hand and cues to push off R arm to ascend trunk. ModA at trunk and legs to complete and square hips with EOB.  Transfers Overall transfer level: Needs assistance Equipment used: Rolling walker (2 wheeled) Transfers: Sit to/from [employer] Sit to Stand: Max assist;From elevated surface (unable to clear buttocks)   Squat pivot transfers: Max assist;From elevated surface     General transfer comment: Attempted to come to stand several times from elevated EOB to RW, even with L knee block, but unsuccessful in clearing buttocks even minimally with maxA. Bed elevated superior to chair and squat pivot to R with pt's R hand on distal chair arm rest, barely made it with maxA due to L knee buckle and poor initiation of L leg muscles even with block.  Ambulation/Gait             General Gait Details: Unable to ambulate safely this date.   Stairs             Wheelchair Mobility    Modified Rankin (Stroke Patients Only) Modified Rankin (Stroke Patients Only) Pre-Morbid Rankin Score: No symptoms Modified Rankin: Severe disability     Balance Overall balance  assessment: Needs assistance Sitting-balance support: Feet supported;Single extremity supported Sitting balance-Leahy Scale: Poor Sitting balance - Comments: LOB repeatedly sitting statically EOB when R hand lifted off bed, resulting in him requiring to hold onto bed rail with R hand to keep balance, min guard-modA.   Standing balance support: During functional activity;Bilateral upper extremity supported Standing balance-Leahy Scale: Zero Standing balance comment: Unable to stand with maxA and UE support.                             Cognition Arousal/Alertness: Awake/alert Behavior During Therapy: Flat affect Overall Cognitive Status: Impaired/Different from baseline Area of Impairment: Attention;Memory;Following commands;Safety/judgement;Awareness;Problem solving                   Current Attention Level: Sustained Memory: Decreased short-term memory;Decreased recall of precautions Following Commands: Follows one step commands inconsistently;Follows one step commands with increased time Safety/Judgement: Decreased awareness of safety;Decreased awareness of deficits Awareness: Emergent Problem Solving: Slow processing;Requires verbal cues;Difficulty sequencing;Requires tactile cues General Comments: Pt with flat affect and poor awareness of his deficits placing him at risk for falls/injury. Pt initially not very aware of his decline since yesterday.      Exercises      General Comments General comments (skin integrity, edema, etc.): Minimal L quads activation with attempted MMT sitting EOB, minimal activation of L hand with squeezing PT's fingers, unable to hold L hand up against gravity; RN immediately notified of these significant changes/decline since yesterday and attending MD and neuro MD also notified      Pertinent Vitals/Pain Pain Assessment: Faces Pain Score: 2  Faces Pain Scale: Hurts a little bit Pain Location: Generalized with mobility Pain Descriptors / Indicators: Discomfort;Grimacing Pain Intervention(s): Limited activity within patient's tolerance;Monitored during session;Repositioned    Home Living     Available Help at Discharge: Friend(s);Available PRN/intermittently Type of Home: House              Prior Function            PT Goals (current goals can now be found in the care plan section) Acute Rehab PT Goals Patient Stated Goal: to go to CIR PT Goal Formulation: With patient Time For Goal Achievement: 05/09/20 Potential to Achieve  Goals: Good Progress towards PT goals: Not progressing toward goals - comment (pt declined significantly overnight)    Frequency    Min 4X/week      PT Plan Current plan remains appropriate    Co-evaluation              AM-PAC PT "6 Clicks" Mobility   Outcome Measure  Help needed turning from your back to your side while in a flat bed without using bedrails?: A Lot Help needed moving from lying on your back to sitting on the side of a flat bed without using bedrails?: A Lot Help needed moving to and from a bed to a chair (including a wheelchair)?: A Lot Help needed standing up from a chair using your arms (e.g., wheelchair or bedside chair)?: Total Help needed to walk in hospital room?: Total Help needed climbing 3-5 steps with a railing? : Total 6 Click Score: 9    End of Session Equipment Utilized During Treatment: Gait belt Activity Tolerance: Patient tolerated treatment well;Other (comment) (limited by decline) Patient left: with call bell/phone within reach;in chair;with chair alarm set Nurse Communication: Mobility status;Need for lift equipment;Other (comment) (significant decline) PT Visit Diagnosis: Unsteadiness on feet (  R26.81);Hemiplegia and hemiparesis;Difficulty in walking, not elsewhere classified (R26.2);Other abnormalities of gait and mobility (R26.89);Muscle weakness (generalized) (M62.81);Other symptoms and signs involving the nervous system (R29.898) Hemiplegia - Right/Left: Left Hemiplegia - dominant/non-dominant: Non-dominant Hemiplegia - caused by: Cerebral infarction     [id number] PT Time Calculation (min) (ACUTE ONLY): 26 min  Charges:  $Therapeutic Activity: 23-37 mins                     Moishe Spice, PT, DPT Acute Rehabilitation Services  Pager: [phone] Office: [place] 04/27/2020, 3:05 PM

## 2020-04-27 NOTE — Progress Notes (Signed)
PROGRESS NOTE    Keith [name]  [id number] DOB: January 30, 1966 DOA: 04/25/2020 PCP: [name], FNP    No chief complaint on file.   Brief Narrative:  Keith [name] is a 55 y.o. male with PMH significant for DM2, CKD 4, CAD status post recent stenting last month for ST elevation MI with history of diabetes mellitus type 2, right BKA, chronic anemia. Patient presented to the ED on 1/18 was brought to the ER after patientpatient states he took his insulin last night long-acting 55 units along with his Premeal insulin following which he did not eat. He started feeling weak on the left upper and lower extremity followed by left facial droop and slurred speech at around 8 PM. Did not have any weakness on the right side.  In the ER, patient was evaluated by neurologist CT head was unremarkable. Blood sugars were in the 60s. Patient passed swallow evaluation.  Labs are largely unremarkable and well at baseline.  MRI brain showed possible acute small vessel ischemia of the right pons with a nearby chronic right paracentral pontine lacunae.  No large vessel occlusion.  But there is evidence of atherosclerosis in the mid basilar artery, distal right ICA and right ACA A2.  Patient was admitted to hospitalist service. Neurology follow-up was obtained.   Assessment & Plan:   Principal Problem:   Left-sided weakness Active Problems:   Essential hypertension   Insulin-requiring or dependent type II diabetes mellitus (HCC)   HIV disease ([place])   Hypoglycemia   Cerebrovascular accident (CVA) ([place])   Type 2 diabetes mellitus with hyperlipidemia ([place])   PAD (peripheral artery disease) ([place])  1 acute CVA Patient has presented with left-sided weakness, slurred speech, left facial droop. CTA negative.   MRI with subtle acute small vessel ischemia of the right pons, with nearby chronic right paracentral pontine lacunar.  No hemorrhage or mass-effect. MRA head and neck with evidence of  atherosclerosis in the mid basilar artery, distal right ICA siphon, right ACA A2 segment.  No other posterior circulatory stenosis.  No cervical carotid stenosis. 2D echo with bubble study with EF of 50 to 55%, mild concentric LVH, grade 1 diastolic dysfunction, agitated saline contrast bubble study negative with no evidence of intra-arterial shunt. Patient seen in consultation by neurology recommending continuation of aspirin 81 mg daily, Brilinta 90 mg twice daily, statin.  PT/OT. Patient today noted to have dysarthric speech, left facial droop, significant left upper and left lower extremity weakness.  Concerned that patient may be having lacunar strokes.  CT head stat to rule out hemorrhage or bleeding.  Check a CBG.  Patient afebrile with no signs or symptoms of infection.  Case discussed with stroke MD, Dr. Leonie Man. Recheck swallow evaluation. Continue current treatments. Consulted inpatient rehab to see if patient is an appropriate candidate.  Supportive care.  Follow.  2.  Hyperlipidemia Fasting lipid panel with LDL of 205.  Goal LDL < 70.  Patient with some degree of noncompliance.  Importance of compliance stressed to patient.  Continue current dose of Lipitor 80 mg daily.  Follow-up.  3.  Hypertension Continue Norvasc, Coreg.  4.  Uncontrolled diabetes mellitus type 2 Hemoglobin A1c > 15.5.  CBG 232 this morning.  Increase Levemir to 50 units daily.  Increase meal coverage NovoLog to 8 units 3 times daily.  Sliding scale insulin.  Follow.  5.  Coronary artery disease status post stenting Stable.  Continue aspirin, Brilinta, statin.  Blood pressure management.  Medication  compliance stressed to patient.  Outpatient follow-up with cardiology.  6.  Chronic kidney disease stage IV Stable.  7.  HIV Continue Biktarvy and dapsone.   8.  Chronic anemia H&H stable.  Anemia panel consistent with anemia of chronic disease.  Transfusion threshold hemoglobin < 7.    9.  Peripheral arterial  disease/right BKA Continue aspirin, Brilinta, statin.  Risk factor modification.  Tight diabetes control.  10.  History of chronic pain Continue current pain regimen.   DVT prophylaxis: Lovenox Code Status: Full Family Communication: Updated patient.  No family at bedside. Disposition:   Status is: Inpatient    Dispo: The patient is from: Home              Anticipated d/c is to: CIR versus SNF              Anticipated d/c date is: 2 to 3 days.              Patient currently unsafe to be discharged home due to debility requiring discharge to inpatient rehab versus short-term SNF.  Patient also with worsening neurological symptoms and not stable at this time for discharge.       Consultants:   Neurology: Dr. Dr. Theda Sers 04/26/2020  Procedures:  MRI brain 04/25/2020  MRA head and neck 04/25/2021  CT head 04/25/2021  2D echo with bubble study 04/26/2020  CT head stat pending 04/27/2020  Antimicrobials:    None   Subjective: Was alerted by nursing that patient with significant weakness on the left, which is a change from the previous 24 hours. Went to assess patient patient with slurred speech/dysarthric speech, left facial weakness/droop, significant weakness left upper extremity and left lower extremity  Objective: Vitals:   04/26/20 2330 04/27/20 0307 04/27/20 0755 04/27/20 1120  BP: 131/88 (!) 160/98 (!) 139/96 127/83  Pulse: 60 60 63 60  Resp: '18 18 20 18  '$ Temp: 98 F (36.7 C) 97.7 F (36.5 C) (!) 97.5 F (36.4 C) 97.9 F (36.6 C)  TempSrc: Oral Oral Oral Oral  SpO2: 99% 100% 99% 99%  Weight:      Height:        Intake/Output Summary (Last 24 hours) at 04/27/2020 1501 Last data filed at 04/27/2020 0307 Gross per 24 hour  Intake 240 ml  Output 750 ml  Net -510 ml   Filed Weights   04/25/20 0132 04/25/20 0134 04/25/20 2353  Weight: 117.9 kg 117.9 kg 117.5 kg    Examination:  General exam: Alert.  Dysarthric speech. Respiratory system: Clear to  auscultation anterior lung fields.  No wheezes, no crackles, no rhonchi.  Respiratory effort normal. Cardiovascular system: S1 & S2 heard, RRR. No JVD, murmurs, rubs, gallops or clicks. No pedal edema. Gastrointestinal system: Abdomen is nondistended, soft and nontender. No organomegaly or masses felt. Normal bowel sounds heard. Central nervous system: Alert and oriented x3. Significant slurred speech. Left facial droop/weakness. Left upper and left lower extremity weakness. 0/5 LUE and LLE strength. Alert and oriented. No focal neurological deficits. Extremities: Status post right BKA. Skin: No rashes, lesions or ulcers Psychiatry: Judgement and insight appear normal. Mood & affect appropriate.     Data Reviewed: I have personally reviewed following labs and imaging studies  CBC: Recent Labs  Lab 04/25/20 0126 04/25/20 0128 04/25/20 0619 04/26/20 1228 04/27/20 0802  WBC 7.0  --  7.5 5.2 5.4  NEUTROABS 3.0  --   --  2.5  --   HGB 11.1* 11.9* 10.6*  10.7* 10.8*  HCT 36.9* 35.0* 34.9* 34.3* 33.9*  MCV 86.6  --  86.8 84.5 85.2  PLT 272  --  246 257 [id number]    Basic Metabolic Panel: Recent Labs  Lab 04/25/20 0126 04/25/20 0128 04/25/20 0619 04/26/20 1228 04/27/20 0802  NA 136 140  --  134* 133*  K 4.0 4.1  --  4.1 4.7  CL 103 105  --  103 103  CO2 22  --   --  23 20*  GLUCOSE 66* 65*  --  238* 278*  BUN 30* 32*  --  32* 38*  CREATININE 2.80* 2.90* 2.65* 2.30* 2.73*  CALCIUM 9.6  --   --  9.1 9.1  MG  --   --   --  2.0 2.0    GFR: Estimated Creatinine Clearance: 38.7 mL/min (A) (by C-G formula based on SCr of 2.73 mg/dL (H)).  Liver Function Tests: Recent Labs  Lab 04/25/20 0126  AST 15  ALT 12  ALKPHOS 68  BILITOT 0.6  PROT 7.9  ALBUMIN 3.3*    CBG: Recent Labs  Lab 04/26/20 1124 04/26/20 1727 04/26/20 2110 04/27/20 0606 04/27/20 1121  GLUCAP 228* 187* 270* 232* 254*     Recent Results (from the past 240 hour(s))  Resp Panel by RT-PCR (Flu A&B, Covid)  Nasopharyngeal Swab     Status: None   Collection Time: 04/25/20  4:35 AM   Specimen: Nasopharyngeal Swab; Nasopharyngeal(NP) swabs in vial transport medium  Result Value Ref Range Status   SARS Coronavirus 2 by RT PCR NEGATIVE NEGATIVE Final    Comment: (NOTE) SARS-CoV-2 target nucleic acids are NOT DETECTED.  The SARS-CoV-2 RNA is generally detectable in upper respiratory specimens during the acute phase of infection. The lowest concentration of SARS-CoV-2 viral copies this assay can detect is 138 copies/mL. A negative result does not preclude SARS-Cov-2 infection and should not be used as the sole basis for treatment or other patient management decisions. A negative result may occur with  improper specimen collection/handling, submission of specimen other than nasopharyngeal swab, presence of viral mutation(s) within the areas targeted by this assay, and inadequate number of viral copies(<138 copies/mL). A negative result must be combined with clinical observations, patient history, and epidemiological information. The expected result is Negative.  Fact Sheet for Patients:  EntrepreneurPulse.com.au  Fact Sheet for Healthcare Providers:  IncredibleEmployment.be  This test is no t yet approved or cleared by the [place] FDA and  has been authorized for detection and/or diagnosis of SARS-CoV-2 by FDA under an Emergency Use Authorization (EUA). This EUA will remain  in effect (meaning this test can be used) for the duration of the COVID-19 declaration under Section 564(b)(1) of the Act, 21 U.S.C.section 360bbb-3(b)(1), unless the authorization is terminated  or revoked sooner.       Influenza A by PCR NEGATIVE NEGATIVE Final   Influenza B by PCR NEGATIVE NEGATIVE Final    Comment: (NOTE) The Xpert Xpress SARS-CoV-2/FLU/RSV plus assay is intended as an aid in the diagnosis of influenza from Nasopharyngeal swab specimens and should not be  used as a sole basis for treatment. Nasal washings and aspirates are unacceptable for Xpert Xpress SARS-CoV-2/FLU/RSV testing.  Fact Sheet for Patients: EntrepreneurPulse.com.au  Fact Sheet for Healthcare Providers: IncredibleEmployment.be  This test is not yet approved or cleared by the [place] FDA and has been authorized for detection and/or diagnosis of SARS-CoV-2 by FDA under an Emergency Use Authorization (EUA). This EUA will remain in effect (  meaning this test can be used) for the duration of the COVID-19 declaration under Section 564(b)(1) of the Act, 21 U.S.C. section 360bbb-3(b)(1), unless the authorization is terminated or revoked.  Performed at [facility], [place] [address] [place], [place] [address]          Radiology Studies: ECHOCARDIOGRAM COMPLETE BUBBLE STUDY  Result Date: 04/26/2020    ECHOCARDIOGRAM REPORT   Patient Name:   RIKER FLEMMING Mccolgan Date of Exam: 04/26/2020 Medical Rec #:  [id number]      Height:       69.0 in Accession #:    [id number]     Weight:       259.0 lb Date of Birth:  1965/12/31      BSA:          2.306 m Patient Age:    83 years       BP:           140/88 mmHg Patient Gender: M              HR:           66 bpm. Exam Location:  Inpatient Procedure: 2D Echo, Cardiac Doppler, Color Doppler and Saline Contrast Bubble            Study Indications:    Stroke  History:        Patient has prior history of Echocardiogram examinations, most                 recent 05/24/2019. Previous Myocardial Infarction and CAD,                 Stroke; Risk Factors:Hypertension, Dyslipidemia and Diabetes.  Sonographer:    Roseanna Rainbow RDCS Referring Phys: [id number] [facility]  Sonographer Comments: Image acquisition challenging due to respiratory motion. IMPRESSIONS  1. Left ventricular ejection fraction, by estimation, is 50 to 55%. The left ventricle has low normal function. The left ventricle has no regional wall motion  abnormalities. There is mild concentric left ventricular hypertrophy. Left ventricular diastolic parameters are consistent with Grade I diastolic dysfunction (impaired relaxation).  2. Right ventricular systolic function is normal. The right ventricular size is normal.  3. The mitral valve is normal in structure. Trivial mitral valve regurgitation. No evidence of mitral stenosis.  4. The aortic valve is tricuspid. Aortic valve regurgitation is not visualized. No aortic stenosis is present.  5. Aortic dilatation noted. There is mild dilatation of the ascending aorta, measuring 39 mm.  6. The inferior vena cava is normal in size with greater than 50% respiratory variability, suggesting right atrial pressure of 3 mmHg.  7. Agitated saline contrast bubble study was negative, with no evidence of any interatrial shunt. FINDINGS  Left Ventricle: Left ventricular ejection fraction, by estimation, is 50 to 55%. The left ventricle has low normal function. The left ventricle has no regional wall motion abnormalities. The left ventricular internal cavity size was normal in size. There is mild concentric left ventricular hypertrophy. Left ventricular diastolic parameters are consistent with Grade I diastolic dysfunction (impaired relaxation). Indeterminate filling pressures. Right Ventricle: The right ventricular size is normal. No increase in right ventricular wall thickness. Right ventricular systolic function is normal. Left Atrium: Left atrial size was normal in size. Right Atrium: Right atrial size was normal in size. Pericardium: There is no evidence of pericardial effusion. Mitral Valve: The mitral valve is normal in structure. Trivial mitral valve regurgitation. No evidence of mitral valve stenosis. Tricuspid Valve: The  tricuspid valve is normal in structure. Tricuspid valve regurgitation is not demonstrated. No evidence of tricuspid stenosis. Aortic Valve: The aortic valve is tricuspid. Aortic valve regurgitation is not  visualized. No aortic stenosis is present. Pulmonic Valve: The pulmonic valve was normal in structure. Pulmonic valve regurgitation is not visualized. No evidence of pulmonic stenosis. Aorta: Aortic dilatation noted. There is mild dilatation of the ascending aorta, measuring 39 mm. Venous: The inferior vena cava is normal in size with greater than 50% respiratory variability, suggesting right atrial pressure of 3 mmHg. IAS/Shunts: No atrial level shunt detected by color flow Doppler. Agitated saline contrast was given intravenously to evaluate for intracardiac shunting. Agitated saline contrast bubble study was negative, with no evidence of any interatrial shunt.  LEFT VENTRICLE PLAX 2D LVIDd:         4.50 cm      Diastology LVIDs:         3.30 cm      LV e' medial:    5.44 cm/s LV PW:         1.11 cm      LV E/e' medial:  13.9 LV IVS:        1.30 cm      LV e' lateral:   7.72 cm/s LVOT diam:     2.40 cm      LV E/e' lateral: 9.8 LV SV:         81 LV SV Index:   35 LVOT Area:     4.52 cm  LV Volumes (MOD) LV vol d, MOD A2C: 111.0 ml LV vol d, MOD A4C: 114.0 ml LV vol s, MOD A2C: 50.9 ml LV vol s, MOD A4C: 53.6 ml LV SV MOD A2C:     60.1 ml LV SV MOD A4C:     114.0 ml LV SV MOD BP:      61.7 ml RIGHT VENTRICLE            IVC RV S prime:     9.90 cm/s  IVC diam: 1.40 cm TAPSE (M-mode): 1.9 cm LEFT ATRIUM           Index       RIGHT ATRIUM           Index LA diam:      2.90 cm 1.26 cm/m  RA Area:     13.80 cm LA Vol (A2C): 38.7 ml 16.78 ml/m RA Volume:   29.90 ml  12.97 ml/m LA Vol (A4C): 31.1 ml 13.49 ml/m  AORTIC VALVE LVOT Vmax:   95.30 cm/s LVOT Vmean:  67.900 cm/s LVOT VTI:    0.178 m  AORTA Ao Root diam: 4.90 cm Ao Asc diam:  3.90 cm MITRAL VALVE MV Area (PHT): 2.24 cm    SHUNTS MV Decel Time: 338 msec    Systemic VTI:  0.18 m MV E velocity: 75.40 cm/s  Systemic Diam: 2.40 cm MV A velocity: 85.70 cm/s MV E/A ratio:  0.88 Skeet Latch MD Electronically signed by Skeet Latch MD Signature Date/Time:  04/26/2020/11:47:17 AM    Final         Scheduled Meds: .  stroke: mapping our early stages of recovery book   Does not apply Once  . amLODipine  10 mg Oral Daily  . aspirin EC  81 mg Oral Daily  . atorvastatin  80 mg Oral q1800  . bictegravir-emtricitabine-tenofovir AF  1 tablet Oral Daily  . carvedilol  25 mg Oral BID WC  . dapsone  100 mg Oral Daily  . enoxaparin (LOVENOX) injection  40 mg Subcutaneous Q24H  . gabapentin  100 mg Oral TID  . icosapent Ethyl  2 g Oral BID  . insulin aspart  0-5 Units Subcutaneous QHS  . insulin aspart  0-9 Units Subcutaneous TID WC  . insulin aspart  8 Units Subcutaneous TID WC  . insulin detemir  50 Units Subcutaneous Daily  . oxybutynin  5 mg Oral BID  . ticagrelor  90 mg Oral BID   Continuous Infusions:   LOS: 1 day    Time spent: 35 minutes    Irine Seal, MD [facility]   To contact the attending provider between 7A-7P or the covering provider during after hours 7P-7A, please log into the web site [email or web address] and access using universal [place] password for that web site. If you do not have the password, please call the hospital operator.  04/27/2020, 3:01 PM

## 2020-04-27 NOTE — Evaluation (Signed)
Speech Language Pathology Evaluation Patient Details Name: Keith [name] MRN: [name] DOB: 1965/12/01 Today's Date: 04/27/2020 Time: [id number] SLP Time Calculation (min) (ACUTE ONLY): 16 min  Problem List:  Patient Active Problem List   Diagnosis Date Noted  . Cerebrovascular accident (CVA) ([place])   . Type 2 diabetes mellitus with hyperlipidemia ([place])   . PAD (peripheral artery disease) ([place])   . Left-sided weakness 04/25/2020  . Hypoglycemia 04/25/2020  . Acute ST elevation myocardial infarction (STEMI) due to occlusion of circumflex coronary artery ([place]) 03/22/2020  . Uncontrolled type 2 diabetes mellitus with hyperglycemia ([place]) 01/14/2019  . Elevated glucose 01/14/2019  . Hemoglobin A1C between 7% and 9% indicating borderline diabetic control 01/14/2019  . Hx of BKA, right ([place]) 01/14/2019  . Pressure injury of skin 11/11/2018  . STEMI (ST elevation myocardial infarction) ([place]) 11/10/2018  . CKD (chronic kidney disease) stage 4, GFR 15-29 ml/min (HCC) 11/10/2018  . Amputation stump infection ([place]) 10/28/2018  . Type II diabetes mellitus with renal manifestations ([place]) 08/07/2018  . Uncontrolled type 2 diabetes mellitus with hyperosmolar nonketotic hyperglycemia ([employer]) 08/07/2018  . Non-pressure chronic ulcer of right calf limited to breakdown of skin ([place]) 07/06/2018  . Type 2 diabetes mellitus without complication, without long-term current use of insulin ([place]) 06/15/2018  . Chronic low back pain without sciatica 06/15/2018  . Idiopathic chronic venous hypertension of left lower extremity with ulcer and inflammation ([place])   . Venous stasis ulcer of left calf limited to breakdown of skin without varicose veins ([place]) 04/23/2018  . Acquired absence of right leg below knee ([place]) 06/13/2017  . Acute renal failure superimposed on stage 3 chronic kidney disease ([place]) 03/15/2017  . Diabetic polyneuropathy associated with type 2 diabetes mellitus ([place]) 01/23/2017  . AKI (acute kidney  injury) ([place]) 09/28/2016  . Nausea vomiting and diarrhea 09/28/2016  . Onychomycosis of multiple toenails with type 2 diabetes mellitus ([place]) 08/29/2015  . MRSA carrier 04/20/2014  . Penile wart 02/22/2014  . HIV disease ([place])   . Insulin-requiring or dependent type II diabetes mellitus ([place]) 02/04/2014  . Dental anomaly 11/20/2012  . Arthritis of right knee 02/23/2012  . Hyperlipidemia 11/10/2011  . Chronic pain 08/07/2011  . Meralgia paraesthetica 04/23/2011  . ERECTILE DYSFUNCTION 08/22/2008  . Essential hypertension 05/19/2008   Past Medical History:  Past Medical History:  Diagnosis Date  . Abscess of right foot    abscess/ulcer of R transtibial amputation requiring IV abx  . AIDS ([place])   . Anemia   . CAD (coronary artery disease)    a. MI with stenting of OM1 in 11/2018 with residual disease. b. acute STEMI 03/2020 s/p DES to OM1  . Chronic anemia   . Chronic knee pain    right  . Chronic pain   . CKD (chronic kidney disease), stage IV ([place])   . Diabetes type 2, uncontrolled ([place])    HgA1c 17.6 (04/27/2010)  . Diabetic foot ulcer ([place]) 01/2017   right foot  . Dilatation of aorta (HCC)   . Erectile dysfunction   . Genital warts   . HIV (human immunodeficiency virus infection) ([place]) 2009   CD4 count 100, VL 13800 (05/01/2010)  . Hyperlipidemia   . Hypertension   . Noncompliance with medication regimen   . Osteomyelitis ([place])    h/o hand  . Osteomyelitis of metatarsal ([place]) 04/28/2017   Past Surgical History:  Past Surgical History:  Procedure Laterality Date  . AMPUTATION Right 05/02/2017   Procedure: AMPUTATION TRANSMETARSAL;  Surgeon:  [name], MD;  Location: [facility];  Service: Orthopedics;  Laterality: Right;  . AMPUTATION Right 06/13/2017   Procedure: RIGHT BELOW KNEE AMPUTATION;  Surgeon: [name], MD;  Location: [facility];  Service: Orthopedics;  Laterality: Right;  . BELOW KNEE LEG AMPUTATION Right 06/13/2017  . BELOW KNEE LEG AMPUTATION    . CORONARY  STENT INTERVENTION N/A 11/10/2018   Procedure: CORONARY STENT INTERVENTION;  Surgeon: Leonie Man, MD;  Location: [facility];  Service: Cardiovascular;  Laterality: N/A;  . CORONARY/GRAFT ACUTE MI REVASCULARIZATION N/A 03/21/2020   Procedure: Coronary/Graft Acute MI Revascularization;  Surgeon: [name], MD;  Location: [facility];  Service: Cardiovascular;  Laterality: N/A;  . HERNIA REPAIR    . I & D EXTREMITY Left 08/21/2014   Procedure: INCISION AND DRAINAGE LEFT SMALL FINGER;  Surgeon: Leanora Cover, MD;  Location: [facility];  Service: Orthopedics;  Laterality: Left;  . I & D EXTREMITY Right 03/18/2017   Procedure: IRRIGATION AND DEBRIDEMENT EXTREMITY;  Surgeon: [name], MD;  Location: [facility];  Service: Orthopedics;  Laterality: Right;  . LEFT HEART CATH AND CORONARY ANGIOGRAPHY N/A 11/10/2018   Procedure: LEFT HEART CATH AND CORONARY ANGIOGRAPHY;  Surgeon: Leonie Man, MD;  Location: [facility];  Service: Cardiovascular;  Laterality: N/A;  . LEFT HEART CATH AND CORONARY ANGIOGRAPHY N/A 03/21/2020   Procedure: LEFT HEART CATH AND CORONARY ANGIOGRAPHY;  Surgeon: [name], MD;  Location: [facility];  Service: Cardiovascular;  Laterality: N/A;  . MINOR IRRIGATION AND DEBRIDEMENT OF WOUND Right 04/22/2014   Procedure: IRRIGATION AND DEBRIDEMENT OF RIGHT NECK ABCESS;  Surgeon: Jerrell Belfast, MD;  Location: [facility];  Service: ENT;  Laterality: Right;  . MULTIPLE EXTRACTIONS WITH ALVEOLOPLASTY N/A 01/18/2013   Procedure: MULTIPLE EXTRACION 3, 6, 7, 10, 11, 13, 21, 22, 27, 28, 29, 30 WITH ALVEOLOPLASTY;  Surgeon: Gae Bon, DDS;  Location: [facility];  Service: Oral Surgery;  Laterality: N/A;  . SKIN SPLIT GRAFT Right 03/21/2017   Procedure: IRRIGATION AND DEBRIDEMENT RIGHT FOOT AND APPLY SPLIT THICKNESS SKIN GRAFT AND WOUND VAC;  Surgeon: [name], MD;  Location: [facility];  Service: Orthopedics;  Laterality: Right;  . TEE WITHOUT CARDIOVERSION  N/A 05/02/2017   Procedure: TRANSESOPHAGEAL ECHOCARDIOGRAM (TEE);  Surgeon: Acie Fredrickson Wonda Cheng, MD;  Location[facility];  Service: Cardiovascular;  Laterality: N/A;  coincidental to orthopedic case   HPI:  Pt presents with L sided weakness UE and LE, facial droop, and slurred speech.  CT neg. MRI suspicious for CVA R pons. PMH - DM, CAD, HIV, rt BKA, CKD, back pain, HTN, diabetic neuropathy, STEMI s/p stent one mo ago   Assessment / Plan / Recommendation Clinical Impression  Pt presents with a mild dysarthria of speech, which he notes has improved since admission. Dysarthria of speech includes decreased vocal intensity and reduced articulation of sounds. Pt remained largely intelligible despite deficits. Pt denies changes in cognitive abilities; difficult to determine if current functioning is worse than baseline. Cognitive deficits included reduced novel recall, decreased executive function skills, and decreased problem solving. Receptive and expressive language skills appeared intact. SLP will follow acutely to assist cognitive linguistic recovery.    SLP Assessment  SLP Recommendation/Assessment: Patient needs continued Speech Lanaguage Pathology Services SLP Visit Diagnosis: Dysarthria and anarthria (R47.1);Cognitive communication deficit (R41.841)    Follow Up Recommendations       Frequency and Duration min 1 x/week  1  week      SLP Evaluation Cognition  Overall Cognitive Status: Difficult to assess Arousal/Alertness: Awake/alert Orientation Level: Oriented X4 Attention: Focused;Sustained Focused Attention: Impaired Focused Attention Impairment: Verbal complex;Functional complex Sustained Attention: Impaired Sustained Attention Impairment: Verbal complex;Functional complex Memory: Impaired Memory Impairment: Decreased recall of new information;Decreased short term memory Awareness: Impaired Awareness Impairment: Emergent impairment Problem Solving: Impaired Problem Solving  Impairment: Verbal complex;Functional complex Executive Function: Organizing;Sequencing Sequencing: Impaired Sequencing Impairment: Verbal complex;Functional complex Organizing: Impaired Organizing Impairment: Verbal complex;Functional complex Safety/Judgment: Impaired       Comprehension  Auditory Comprehension Overall Auditory Comprehension: Appears within functional limits for tasks assessed Visual Recognition/Discrimination Discrimination: Within Function Limits    Expression Verbal Expression Overall Verbal Expression: Appears within functional limits for tasks assessed Written Expression Dominant Hand: Right   Oral / Motor  Oral Motor/Sensory Function Overall Oral Motor/Sensory Function: Within functional limits Motor Speech Overall Motor Speech: Impaired Respiration: Impaired Level of Impairment: Conversation Phonation: Normal Resonance: Within functional limits Articulation: Impaired Level of Impairment: Sentence Intelligibility: Intelligible Motor Planning: Witnin functional limits   GO                    Hayden Rasmussen MA, CCC-SLP Acute Rehabilitation Services  04/27/2020, 12:49 PM

## 2020-04-27 NOTE — Progress Notes (Signed)
STROKE TEAM PROGRESS NOTE   INTERVAL HISTORY Patient had significant worsening of his slurred speech and left-sided weakness noted this afternoon by therapist.  He however appears to be more alert and interactive but clearly on my exam has significant dense hemiplegia on the left today.  Vital signs been stable.  Stat CT scan of the head has been ordered Vitals:   04/27/20 0307 04/27/20 0755 04/27/20 1120 04/27/20 1524  BP: (!) 160/98 (!) 139/96 127/83 106/75  Pulse: 60 63 60 (!) 59  Resp: '18 20 18 18  '$ Temp: 97.7 F (36.5 C) (!) 97.5 F (36.4 C) 97.9 F (36.6 C) 97.6 F (36.4 C)  TempSrc: Oral Oral Oral Oral  SpO2: 100% 99% 99% 99%  Weight:      Height:       CBC:  Recent Labs  Lab 04/25/20 0126 04/25/20 0128 04/26/20 1228 04/27/20 0802  WBC 7.0   < > 5.2 5.4  NEUTROABS 3.0  --  2.5  --   HGB 11.1*   < > 10.7* 10.8*  HCT 36.9*   < > 34.3* 33.9*  MCV 86.6   < > 84.5 85.2  PLT 272   < > 257 241   < > = values in this interval not displayed.   Basic Metabolic Panel:  Recent Labs  Lab 04/26/20 1228 04/27/20 0802  NA 134* 133*  K 4.1 4.7  CL 103 103  CO2 23 20*  GLUCOSE 238* 278*  BUN 32* 38*  CREATININE 2.30* 2.73*  CALCIUM 9.1 9.1  MG 2.0 2.0   Lab Results  Component Value Date   CHOL 280 (H) 04/25/2020   HDL 32 (L) 04/25/2020   LDLCALC 205 (H) 04/25/2020   TRIG 217 (H) 04/25/2020   CHOLHDL 8.8 04/25/2020    Lab Results  Component Value Date   HGBA1C >15.5 (H) 03/24/2020   Urine Drug Screen:  Recent Labs  Lab 04/25/20 0755  LABOPIA NONE DETECTED  COCAINSCRNUR NONE DETECTED  LABBENZ NONE DETECTED  AMPHETMU NONE DETECTED  THCU NONE DETECTED  LABBARB NONE DETECTED     Alcohol Level  Recent Labs  Lab 04/25/20 0358  ETH <10    IMAGING past 24 hours No results found.  PHYSICAL EXAM Obese middle-aged African-American male not in distress. . Afebrile. Head is nontraumatic. Neck is supple without bruit.    Cardiac exam no murmur or gallop. Lungs  are clear to auscultation. Distal pulses are well felt. Neurological Exam : Is awake alert severely dysarthric speech. He is oriented to time place and person. .no aphasia. Follows commands well. Extraocular movements are full range without nystagmus.  Mild left lower facial weakness tongue is midline. Motor system exam shows dense left hemiplegia with 1/5 strength in both upper and lower extremity with decreased tone.  Normal antigravity strength on the right.. Sensation intact bilaterally. Reflexes are symmetric. Plantars downgoing. Gait not tested. ASSESSMENT/PLAN [name] is a 55 y.o. male with history of HIV, HTN, CAD/MI s/p stent, DM 2 with vascular and neurologic complications presenting with left sided weakness and slurred speech. Possible metabolic and functional etiology.  Stroke:   Small R pontine infarct secondary to small vessel disease source Acute neurological worsening on 04/27/2020.  Repeat CT scan pending  Code Stroke CT head No acute abnormality. ASPECTS 10.     MRI  Possible small R pontine infarct. Old R paracentral pontine lacune.   MRA head & neck   Atherosclerosis mild mid BA, mild to  moderate distal R ICA siphon and mild R A2 stenoses  2D Echo w/ bubble ejection fraction 50 to 55%.  No wall motion abnormalities.  Mild left ventricular concentric hypertrophy LDL   205  HgbA1c >15.5 03/24/2020  VTE prophylaxis - Lovenox 40 mg sq daily   aspirin 81 mg daily and Brilinta (ticagrelor) 90 mg bid prior to admission for cardiac revascularization in 03/2020, now on aspirin 81 mg daily and Brilinta (ticagrelor) 90 mg bid. Continue DAPT at d/c.   Therapy recommendations: SNF  disposition: SNF Hypertension  Stable on the high side . Permissive hypertension (OK if < 220/120) but gradually normalize in 5-7 days . Long-term BP goal normotensive  Hyperlipidemia  Home meds:  Lipitor 80, resumed in hospital  LDL 199 03/21/2020, goal < 70  Continue statin at  discharge  Diabetes type II Uncontrolled  HgbA1c >15.5 03/24/2020, goal < 7.0  CBGs Recent Labs    04/26/20 2110 04/27/20 0606 04/27/20 1121  GLUCAP 270* 232* 254*      SSI  Other Stroke Risk Factors  Hx ETOH use, alcohol level <10, advised to drink no more than 2 drink(s) a day  Hx drug use  Obesity, Body mass index is 38.25 kg/m., BMI >/= 30 associated with increased stroke risk, recommend weight loss, diet and exercise as appropriate   Coronary artery disease s/p MI w/ recent stent on aspirin and Brilinta, BB 03/2020  HIV on Biktarvy and dapsone.  R BKA  Other Active Problems  CKD stage IV w anemia of chronic dz  Hx chronic pain  Hospital day # 1 Patient shown significant neurological worsening this morning likely due to extension of his pontine stroke.  Recommend keep patient flat in bed.  IV hydration with normal saline.  Repeat stat CT scan of the head and if no bleed found we will continue ongoing antiplatelet therapy..  Discussed with patient and Dr. [place] Silos.  Kindly call for questions greater than 50% time during this 35-minute visit was spent on counseling and coordination of care about lacunar stroke and discussion about prevention and treatment and answering questions. Antony Contras, MD To contact Stroke Continuity provider, please refer to [email or web address] After hours, contact General Neurology

## 2020-04-27 NOTE — Progress Notes (Addendum)
Occupational Therapy Treatment Patient Details Name: Keith [name] MRN: [id number] DOB: 1965-11-27 Today's Date: 04/27/2020    History of present illness Pt presents with L sided weakness, facial droop, and slurred speech.  CT neg. MRI suspicious for CVA R pons. On 1/20, patient with worsening L-sided weakness. Code stroke called. Work-up pending. PMHx significant for DMII, CAD, HIV, rt BKA, CKD, back pain, HTN, diabetic neuropathy, and STEMI s/p stent one mo ago.   OT comments  Patient met seated in recliner poorly positioned with R lateral lean. Patient also found to be incontinent of bowel/bladder and seemingly unaware. RN notes that yesterday patient was continent x2. Per PT note from 04/27/19, patient +1 assist for functional transfers and bouts of mobility upward to 53ft. PT also reports that patient was able to don R prosthetic with use of affected LUE and good bimanual manipulation yesterday. Today patient with LUE flaccidity. Patient with decline in function demonstrating worsening L-sided hemiplegia, L-sided inattention, decreased cognition with inability to follow 1-step verbal commands consistently, decreased awareness of deficits, and need for +2 assist and use of Stedy for functional transfers. Code stroke called and patient sent for CT. OT will continue to follow acutely for continued assessment/treatment. Continued recommendation for intensive inpatient rehab placement.    Follow Up Recommendations  CIR    Equipment Recommendations  Other (comment) (TBD)    Recommendations for Other Services      Precautions / Restrictions Precautions Precautions: Fall Precaution Comments: Monitor vitals, L-hemiplegia, dysarthric, hx of R BKA Required Braces or Orthoses: Other Brace Other Brace: R prosthesis in room Restrictions Weight Bearing Restrictions: No       Mobility Bed Mobility Overal bed mobility: Needs Assistance Bed Mobility: Supine to Sit     Supine to sit: HOB  elevated;Min guard     General bed mobility comments: HOB elevated and min guard for safety, requiring extra time to manage legs with cues to pull up on bed rail to ascend trunk.  Transfers Overall transfer level: Needs assistance Equipment used: Rolling walker (2 wheeled) Transfers: Sit to/from Stand Sit to Stand: Max assist;+2 physical assistance;+2 safety/equipment   Squat pivot transfers: Max assist;From elevated surface     General transfer comment: Max A +2 for sit to stand in stedy and for sit to stand from perched position in stedy. Mirror for visual feedback 2/2 poor orientation to midline. Only able to maintain standing for 10-15 sec bouts.    Balance Overall balance assessment: Needs assistance Sitting-balance support: Feet supported;No upper extremity supported;Single extremity supported Sitting balance-Leahy Scale: Fair Sitting balance - Comments: 1-no UE support, reaching off BOS to donn prosthesis and L sock, inc difficulty with L sock.   Standing balance support: During functional activity;Bilateral upper extremity supported Standing balance-Leahy Scale: Poor Standing balance comment: Reliant on UE support and external assist                           ADL either performed or assessed with clinical judgement   ADL Overall ADL's : Needs assistance/impaired                         Toilet Transfer: Maximal assistance;+2 for physical assistance;+2 for safety/equipment Toilet Transfer Details (indicate cue type and reason): Stedy Toileting- Clothing Manipulation and Hygiene: Maximal assistance;+2 for safety/equipment Toileting - Clothing Manipulation Details (indicate cue type and reason): Patient incontinent of bowel and bladder. Seemingly unaware. Max A +2  for sit to stand in stedy for hygiene/clothing management.       General ADL Comments: Mobility deferred 2/2 new onset worsening L-sided weakness.     Vision       Perception      Praxis      Cognition Arousal/Alertness: Awake/alert Behavior During Therapy: Flat affect Overall Cognitive Status: Impaired/Different from baseline Area of Impairment: Attention;Memory;Following commands;Safety/judgement;Awareness;Problem solving                   Current Attention Level: Sustained (Easily distracted by environment) Memory: Decreased short-term memory;Decreased recall of precautions Following Commands: Follows one step commands inconsistently;Follows one step commands with increased time Safety/Judgement: Decreased awareness of safety;Decreased awareness of deficits (tries to say he can walk without assistance) Awareness: Emergent Problem Solving: Slow processing;Requires verbal cues;Difficulty sequencing General Comments: Patient unaware of worsened deficits despite education. Patient does not understand why he now requires +2 assist.        Exercises     Shoulder Instructions       General Comments RN aware of recent change in status.    Pertinent Vitals/ Pain       Pain Assessment: Faces Pain Score: 2  Faces Pain Scale: Hurts a little bit Pain Location: Generalized with mobility Pain Descriptors / Indicators: Discomfort;Grimacing Pain Intervention(s): Limited activity within patient's tolerance;Monitored during session;Repositioned  Home Living     Available Help at Discharge: Friend(s);Available PRN/intermittently Type of Home: House                              Lives With: Family;Friend(s)    Prior Functioning/Environment              Frequency  Min 2X/week        Progress Toward Goals  OT Goals(current goals can now be found in the care plan section)  Progress towards OT goals: Goals drowngraded-see care plan  Acute Rehab OT Goals Patient Stated Goal: To become independent again OT Goal Formulation: With patient Time For Goal Achievement: 05/09/20 Potential to Achieve Goals: Good  Plan Discharge plan  remains appropriate;Frequency remains appropriate    Co-evaluation                 AM-PAC OT "6 Clicks" Daily Activity     Outcome Measure   Help from another person eating meals?: A Little Help from another person taking care of personal grooming?: A Lot Help from another person toileting, which includes using toliet, bedpan, or urinal?: Total Help from another person bathing (including washing, rinsing, drying)?: Total Help from another person to put on and taking off regular upper body clothing?: A Lot Help from another person to put on and taking off regular lower body clothing?: Total 6 Click Score: 10    End of Session Equipment Utilized During Treatment: Gait belt;Other (comment) Charlaine Dalton)  OT Visit Diagnosis: Other abnormalities of gait and mobility (R26.89);Unsteadiness on feet (R26.81);Muscle weakness (generalized) (M62.81);Pain   Activity Tolerance Patient limited by fatigue   Patient Left in chair;with call bell/phone within reach;with chair alarm set   Nurse Communication Mobility status;Need for lift equipment (Stedy +2)        Time: 0454-0981 OT Time Calculation (min): 42 min  Charges: OT General Charges $OT Visit: 1 Visit OT Treatments $Self Care/Home Management : 38-52 mins  Keanu Lesniak H. OTR/L Supplemental OT, Department of rehab services [phone]   Blythe Hartshorn R H. 04/27/2020, 3:15 PM

## 2020-04-28 DIAGNOSIS — I639 Cerebral infarction, unspecified: Secondary | ICD-10-CM

## 2020-04-28 DIAGNOSIS — R531 Weakness: Secondary | ICD-10-CM | POA: Diagnosis not present

## 2020-04-28 DIAGNOSIS — I10 Essential (primary) hypertension: Secondary | ICD-10-CM | POA: Diagnosis not present

## 2020-04-28 DIAGNOSIS — N184 Chronic kidney disease, stage 4 (severe): Secondary | ICD-10-CM

## 2020-04-28 LAB — BASIC METABOLIC PANEL
Anion gap: 10 (ref 5–15)
BUN: 36 mg/dL — ABNORMAL HIGH (ref 6–20)
CO2: 21 mmol/L — ABNORMAL LOW (ref 22–32)
Calcium: 9.2 mg/dL (ref 8.9–10.3)
Chloride: 105 mmol/L (ref 98–111)
Creatinine, Ser: 2.54 mg/dL — ABNORMAL HIGH (ref 0.61–1.24)
GFR, Estimated: 29 mL/min — ABNORMAL LOW (ref >60)
Glucose, Bld: 121 mg/dL — ABNORMAL HIGH (ref 70–99)
Potassium: 4.1 mmol/L (ref 3.5–5.1)
Sodium: 136 mmol/L (ref 135–145)

## 2020-04-28 LAB — CBC
HCT: 32.5 % — ABNORMAL LOW (ref 39.0–52.0)
Hemoglobin: 10.4 g/dL — ABNORMAL LOW (ref 13.0–17.0)
MCH: 27.3 pg (ref 26.0–34.0)
MCHC: 32 g/dL (ref 30.0–36.0)
MCV: 85.3 fL (ref 80.0–100.0)
Platelets: 243 10*3/uL (ref 150–400)
RBC: 3.81 MIL/uL — ABNORMAL LOW (ref 4.22–5.81)
RDW: 14.7 % (ref 11.5–15.5)
WBC: 6 10*3/uL (ref 4.0–10.5)
nRBC: 0 % (ref 0.0–0.2)

## 2020-04-28 LAB — GLUCOSE, CAPILLARY
Glucose-Capillary: 106 mg/dL — ABNORMAL HIGH (ref 70–99)
Glucose-Capillary: 105 mg/dL — ABNORMAL HIGH (ref 70–99)
Glucose-Capillary: 154 mg/dL — ABNORMAL HIGH (ref 70–99)
Glucose-Capillary: 177 mg/dL — ABNORMAL HIGH (ref 70–99)

## 2020-04-28 LAB — MAGNESIUM: Magnesium: 2.1 mg/dL (ref 1.7–2.4)

## 2020-04-28 NOTE — Progress Notes (Signed)
STROKE TEAM PROGRESS NOTE   INTERVAL HISTORY No acute events overnight. Patient's condition [name] slightly improved and now able to move left upper and lower extremity against gravity but still has left hemiparesis. Patient up in room working with therapy team. Agrees to sleep smart study.  Vitals:   04/28/20 0758 04/28/20 1153 04/28/20 1200 04/28/20 1205  BP: 115/79 110/72 101/74 101/71  Pulse: 65 64 64 64  Resp: '18 18 19 20  '$ Temp: 98.4 F (36.9 C) 98 F (36.7 C) 98.3 F (36.8 C)   TempSrc: Oral Oral Oral Oral  SpO2: 100% 100% 100% 100%  Weight:      Height:       CBC:  Recent Labs  Lab 04/25/20 0126 04/25/20 0128 04/26/20 1228 04/27/20 0802 04/28/20 0451  WBC 7.0   < > 5.2 5.4 6.0  NEUTROABS 3.0  --  2.5  --   --   HGB 11.1*   < > 10.7* 10.8* 10.4*  HCT 36.9*   < > 34.3* 33.9* 32.5*  MCV 86.6   < > 84.5 85.2 85.3  PLT 272   < > 257 241 243   < > = values in this interval not displayed.   Basic Metabolic Panel:  Recent Labs  Lab 04/27/20 0802 04/28/20 0451  NA 133* 136  K 4.7 4.1  CL 103 105  CO2 20* 21*  GLUCOSE 278* 121*  BUN 38* 36*  CREATININE 2.73* 2.54*  CALCIUM 9.1 9.2  MG 2.0 2.1   Lab Results  Component Value Date   CHOL 280 (H) 04/25/2020   HDL 32 (L) 04/25/2020   LDLCALC 205 (H) 04/25/2020   TRIG 217 (H) 04/25/2020   CHOLHDL 8.8 04/25/2020    Lab Results  Component Value Date   HGBA1C >15.5 (H) 03/24/2020   Urine Drug Screen:  Recent Labs  Lab 04/25/20 0755  LABOPIA NONE DETECTED  COCAINSCRNUR NONE DETECTED  LABBENZ NONE DETECTED  AMPHETMU NONE DETECTED  THCU NONE DETECTED  LABBARB NONE DETECTED     Alcohol Level  Recent Labs  Lab 04/25/20 0358  ETH <10    IMAGING past 24 hours CT HEAD WO CONTRAST  Result Date: 04/27/2020 CLINICAL DATA:  Left-sided weakness EXAM: CT HEAD WITHOUT CONTRAST TECHNIQUE: Contiguous axial images were obtained from the base of the skull through the vertex without intravenous contrast. COMPARISON:   04/25/2020 FINDINGS: Brain: More well-defined area of decreased attenuation [name] identified in the right half of the pons similar to that seen on prior MRI examination consistent with evolving ischemia. No new area of hemorrhage, infarct or space-occupying mass lesion [name] seen. Vascular: No hyperdense vessel or unexpected calcification. Skull: Normal. Negative for fracture or focal lesion. Sinuses/Orbits: No acute finding. Other: None. IMPRESSION: Better defined area of decreased attenuation in the right half of the pons consistent with evolving ischemia. No new focal abnormality [name] noted. Electronically Signed   By: Inez Catalina M.D.   On: 04/27/2020 16:01    PHYSICAL EXAM Obese middle-aged African-American male not in distress. . Afebrile. Head [name] nontraumatic. Neck [name] supple without bruit.    Cardiac exam no murmur or gallop. Lungs are clear to auscultation. Distal pulses are well felt. Neurological Exam : [name] awake alert moderately dysarthric speech. He [name] oriented to time place and person. .no aphasia. Follows commands well. Extraocular movements are full range without nystagmus.  Mild left lower facial weakness tongue [name] midline. Motor system exam shows dense left hemiplegia with 2-3/5 strength in both  upper and lower extremity with decreased tone.  Normal antigravity strength on the right.. Sensation intact bilaterally. Reflexes are symmetric. Plantars downgoing. Gait not tested.  NIH stroke scale 6.  Premorbid baseline modified Rankin 0  ASSESSMENT/PLAN Keith [name] a 55 y.o. male with history of HIV, HTN, CAD/MI s/p stent, DM 2 with vascular and neurologic complications presenting with left sided weakness and slurred speech. Possible metabolic and functional etiology.  Stroke:   Small R pontine infarct secondary to small vessel disease source Acute neurological worsening on 04/27/2020.  Repeat CT scan yesterday showed expected evolutionary changes in the right paramedian pontine infarct  without any acute abnormality.  Code Stroke CT head No acute abnormality. ASPECTS 10.   Repeat HCT 04/27/20: Better defined area of decreased attenuation in the right half ofthe pons consistent with evolving ischemia.  No new focal abnormality [name] noted  MRI  Possible small R pontine infarct. Old R paracentral pontine lacune.   MRA head & neck   Atherosclerosis mild mid BA, mild to moderate distal R ICA siphon and mild R A2 stenoses  2D Echo w/ bubble ejection fraction 50 to 55%.  No wall motion abnormalities.  Mild left ventricular concentric hypertrophy LDL   205  HgbA1c >15.5 03/24/2020  VTE prophylaxis - Lovenox 40 mg sq daily   aspirin 81 mg daily and Brilinta (ticagrelor) 90 mg bid prior to admission for cardiac revascularization in 03/2020, now on aspirin 81 mg daily and Brilinta (ticagrelor) 90 mg bid. Continue DAPT at d/c.   Therapy recommendations: SNF  disposition: SNF Hypertension  Stable on the high side . Permissive hypertension (OK if < 220/120) but gradually normalize in 5-7 days . Long-term BP goal normotensive  Hyperlipidemia  Home meds:  Lipitor 80, resumed in hospital  LDL 199 03/21/2020, goal < 70  Continue statin at discharge  Diabetes type II Uncontrolled  HgbA1c >15.5 03/24/2020, goal < 7.0  CBGs Recent Labs    04/27/20 2102 04/28/20 0601 04/28/20 0958  GLUCAP 157* 106* 105*      SSI  Other Stroke Risk Factors  Hx ETOH use, alcohol level <10, advised to drink no more than 2 drink(s) a day  Hx drug use  Obesity, Body mass index [name] 38.25 kg/m., BMI >/= 30 associated with increased stroke risk, recommend weight loss, diet and exercise as appropriate   Coronary artery disease s/p MI w/ recent stent on aspirin and Brilinta, BB 03/2020  HIV on Biktarvy and dapsone.  R BKA  Other Active Problems  CKD stage IV w anemia of chronic dz  Hx chronic pain  Hospital day #3 Continue ongoing therapies and mobilize gradually out of bed as  tolerated.  Continue dual antiplatelet therapy and aggressive risk factor modification.  Transfer to inpatient rehab in the next few days.  Patient [name] interested in participating the sleep smart study.  He was given information to review and decide. Greater than 50% time during this 25-minute visit was spent on counseling and coordination of care about his pontine lacunar stroke and answering questions. Antony Contras, MD To contact Stroke Continuity provider, please refer to [email or web address] After hours, contact General Neurology

## 2020-04-28 NOTE — Evaluation (Signed)
Clinical/Bedside Swallow Evaluation Patient Details  Name: Keith [name] MRN: [id number] Date of Birth: 03-Apr-1966  Today's Date: 04/28/2020 Time: SLP Start Time (ACUTE ONLY): 1026 SLP Stop Time (ACUTE ONLY): 1041 SLP Time Calculation (min) (ACUTE ONLY): 15 min  Past Medical History:  Past Medical History:  Diagnosis Date  . Abscess of right foot    abscess/ulcer of R transtibial amputation requiring IV abx  . AIDS ([place])   . Anemia   . CAD (coronary artery disease)    a. MI with stenting of OM1 in 11/2018 with residual disease. b. acute STEMI 03/2020 s/p DES to OM1  . Chronic anemia   . Chronic knee pain    right  . Chronic pain   . CKD (chronic kidney disease), stage IV ([place])   . Diabetes type 2, uncontrolled ([place])    HgA1c 17.6 (04/27/2010)  . Diabetic foot ulcer ([place]) 01/2017   right foot  . Dilatation of aorta ([facility])   . Erectile dysfunction   . Genital warts   . HIV (human immunodeficiency virus infection) ([place]) 2009   CD4 count 100, VL 13800 (05/01/2010)  . Hyperlipidemia   . Hypertension   . Noncompliance with medication regimen   . Osteomyelitis ([place])    h/o hand  . Osteomyelitis of metatarsal ([place]) 04/28/2017   Past Surgical History:  Past Surgical History:  Procedure Laterality Date  . AMPUTATION Right 05/02/2017   Procedure: AMPUTATION TRANSMETARSAL;  Surgeon: [name], MD;  Location: [facility];  Service: Orthopedics;  Laterality: Right;  . AMPUTATION Right 06/13/2017   Procedure: RIGHT BELOW KNEE AMPUTATION;  Surgeon: [name], MD;  Location: [facility];  Service: Orthopedics;  Laterality: Right;  . BELOW KNEE LEG AMPUTATION Right 06/13/2017  . BELOW KNEE LEG AMPUTATION    . CORONARY STENT INTERVENTION N/A 11/10/2018   Procedure: CORONARY STENT INTERVENTION;  Surgeon: Leonie Man, MD;  Location: [facility];  Service: Cardiovascular;  Laterality: N/A;  . CORONARY/GRAFT ACUTE MI REVASCULARIZATION N/A 03/21/2020   Procedure: Coronary/Graft Acute MI  Revascularization;  Surgeon: [name], MD;  Location: [facility];  Service: Cardiovascular;  Laterality: N/A;  . HERNIA REPAIR    . I & D EXTREMITY Left 08/21/2014   Procedure: INCISION AND DRAINAGE LEFT SMALL FINGER;  Surgeon: Leanora Cover, MD;  Location: [facility];  Service: Orthopedics;  Laterality: Left;  . I & D EXTREMITY Right 03/18/2017   Procedure: IRRIGATION AND DEBRIDEMENT EXTREMITY;  Surgeon: [name], MD;  Location: [facility];  Service: Orthopedics;  Laterality: Right;  . LEFT HEART CATH AND CORONARY ANGIOGRAPHY N/A 11/10/2018   Procedure: LEFT HEART CATH AND CORONARY ANGIOGRAPHY;  Surgeon: Leonie Man, MD;  Location: [facility];  Service: Cardiovascular;  Laterality: N/A;  . LEFT HEART CATH AND CORONARY ANGIOGRAPHY N/A 03/21/2020   Procedure: LEFT HEART CATH AND CORONARY ANGIOGRAPHY;  Surgeon: [name], MD;  Location: [facility];  Service: Cardiovascular;  Laterality: N/A;  . MINOR IRRIGATION AND DEBRIDEMENT OF WOUND Right 04/22/2014   Procedure: IRRIGATION AND DEBRIDEMENT OF RIGHT NECK ABCESS;  Surgeon: Jerrell Belfast, MD;  Location: [facility];  Service: ENT;  Laterality: Right;  . MULTIPLE EXTRACTIONS WITH ALVEOLOPLASTY N/A 01/18/2013   Procedure: MULTIPLE EXTRACION 3, 6, 7, 10, 11, 13, 21, 22, 27, 28, 29, 30 WITH ALVEOLOPLASTY;  Surgeon: Gae Bon, DDS;  Location: [facility];  Service: Oral Surgery;  Laterality: N/A;  . SKIN SPLIT GRAFT Right  03/21/2017   Procedure: IRRIGATION AND DEBRIDEMENT RIGHT FOOT AND APPLY SPLIT THICKNESS SKIN GRAFT AND WOUND VAC;  Surgeon: [name], MD;  Location: [facility];  Service: Orthopedics;  Laterality: Right;  . TEE WITHOUT CARDIOVERSION N/A 05/02/2017   Procedure: TRANSESOPHAGEAL ECHOCARDIOGRAM (TEE);  Surgeon: Acie Fredrickson Wonda Cheng, MD;  Location: [facility] OR;  Service: Cardiovascular;  Laterality: N/A;  coincidental to orthopedic case   HPI:  Pt presents with L sided weakness, facial droop, and slurred speech.  CT neg.  MRI suspicious for CVA R pons. On 1/20, patient with worsening L-sided weakness. Code stroke called. Work-up pending. PMHx significant for DMII, CAD, HIV, rt BKA, CKD, back pain, HTN, diabetic neuropathy, and STEMI s/p stent one mo ago.   Assessment / Plan / Recommendation Clinical Impression  Consult for swallow eval ordered due to acute changes in swallow function yesterday.  Pt appears to be doing much better today.  He continues with a dysarthria as well as left focal deficits in CN V, VII, however he is protecting his airway and did not demonstrate any s/s of aspiration today.  He consumed crackers, applesauce, and thin water with good oral attention and bolus control, the appearance of a brisk swallow, and as stated, no concerns for aspiration after 3 oz of water and mixed solid/liquid consistencies.  Recommend resuming a regular diet, thin liquids.  Instructed pt to let his nurse know if he begins coughing again with liquids.  D/W RN.  No f/u for swallowing; SLP to follow for cognition/speech. SLP Visit Diagnosis: Dysphagia, unspecified (R13.10)    Aspiration Risk  No limitations    Diet Recommendation   regular solids, thin liquids  Medication Administration: Whole meds with liquid    Other  Recommendations Oral Care Recommendations: Oral care BID   Follow up Recommendations   none       Swallow Study   General HPI: Pt presents with L sided weakness, facial droop, and slurred speech.  CT neg. MRI suspicious for CVA R pons. On 1/20, patient with worsening L-sided weakness. Code stroke called. Work-up pending. PMHx significant for DMII, CAD, HIV, rt BKA, CKD, back pain, HTN, diabetic neuropathy, and STEMI s/p stent one mo ago. Type of Study: Bedside Swallow Evaluation Previous Swallow Assessment: no Diet Prior to this Study: NPO Temperature Spikes Noted: No Respiratory Status: Room air History of Recent Intubation: No Behavior/Cognition: Alert;Cooperative;Pleasant mood Oral Cavity  Assessment: Within Functional Limits Oral Care Completed by SLP: No Oral Cavity - Dentition: Edentulous Vision: Functional for self-feeding Self-Feeding Abilities: Able to feed self Patient Positioning: Upright in bed Baseline Vocal Quality: Normal Volitional Cough: Strong Volitional Swallow: Able to elicit    Oral/Motor/Sensory Function Overall Oral Motor/Sensory Function: Mild impairment Facial ROM: Reduced left Facial Symmetry: Abnormal symmetry left;Suspected CN VII (facial) dysfunction Facial Strength: Reduced left;Suspected CN VII (facial) dysfunction Facial Sensation: Reduced left;Suspected CN V (Trigeminal) dysfunction Lingual Symmetry: Within Functional Limits Lingual Strength: Within Functional Limits Mandible: Impaired   Ice Chips Ice chips: Within functional limits   Thin Liquid Thin Liquid: Within functional limits    Nectar Thick Nectar Thick Liquid: Not tested   Honey Thick Honey Thick Liquid: Not tested   Puree Puree: Within functional limits   Solid     Solid: Within functional limits      Juan Quam Laurice 04/28/2020,10:52 AM  Estill Bamberg L. Tivis Ringer, [place] Office number [phone] Pager [phone]

## 2020-04-28 NOTE — TOC Progression Note (Signed)
Transition of Care [facility]) - Progression Note    Patient Details  Name: Keith [name] MRN: [name] Date of Birth: [birth year]  Transition of Care [facility]) CM/SW Contact  Pollie Friar, RN Phone Number: 04/28/2020, 3:01 PM  Clinical Narrative:    Pts mother not able to provide the assistance pt will need after a CIR stay. Plan is for SNF rehab. Pt without any current bed offers.  TOC following.   Expected Discharge Plan: Skilled Nursing Facility Barriers to Discharge: Continued Medical Work up  Expected Discharge Plan and Services Expected Discharge Plan: [place] In-house Referral: Clinical Social Work Discharge Planning Services: CM Consult Post Acute Care Choice: [place] arrangements for the past 2 months: Single Family Home                                       Social Determinants of Health (SDOH) Interventions    Readmission Risk Interventions Readmission Risk Prevention Plan 11/13/2018 11/13/2018 11/03/2018  Transportation Screening - Complete Complete  PCP or Specialist Appt within 3-5 Days - - Complete  HRI or Home Care Consult - - Complete  Social Work Consult for [place] Planning/Counseling - - Complete  Palliative Care Screening - - Not Applicable  Medication Review Press photographer) - Complete Complete  PCP or Specialist appointment within 3-5 days of discharge Complete - -  [place] or Home Care Consult Complete - -  SW Recovery Care/Counseling Consult Complete - -  Palliative Care Screening Not Applicable - -  [place] Not Applicable - -  Some recent data might be hidden

## 2020-04-28 NOTE — Progress Notes (Signed)
PROGRESS NOTE    Keith [name]   [id number]  DOB: 1965/07/29  DOA: 04/25/2020 PCP: [name], FNP   Brief Narrative:  Keith [name] is a 55 y.o.malewith PMH significant for HIV, DM2,CKD 4,CAD status post recent stenting last month for ST elevation MI with history of diabetes mellitus type 2, right BKA & chronicanemia.  He presented to the ED via EMS for complaints of left arm weakness that had started in the middle of the night.  He was further noted to have a left-sided facial droop and left leg weakness and slurred speech.  His blood glucose was in the 60s.    A code stroke was called and the patient was evaluated by neurology in the ED.  His neurological symptoms improved while he was in the ED.  However, he has been having episodic progression of his neurological symptoms during his hospital stay.  Initial MRI imaging on 1/18 revealed subtle acute small vessel ischemia in the Right Pons, with a nearby chronic right paracentral pontine lacune (see also #3).  Due to progression of symptoms on 1/20, a CT of the head without contrast was obtained which revealed the following: decreased attenuation in the right half of the pons consistent with evolving ischemia.   Subjective: Patient has no new complaints this morning.    Assessment & Plan:   Principal Problem:   Acute cerebrovascular accident (CVA) due to ischemia/atherosclerotic disease -The patient has had a right pontine infarct with symptoms of waxing and waning slurred speech, left facial droop, left arm and left leg weakness -His MRA reveals atherosclerotic disease and stenosis in multiple vessels - Appreciate care from stroke team- recommendations are to continue aspirin 81 mg and Brilinta 90 mg twice daily which were prescribed after his MI/stenting -He has significant risk factors for stroke including uncontrolled diabetes and hyperlipidemia - Cholesterol is 280, LDL 205 and HDL 32 -Hemoglobin A1c on  12/17 was > 15.5 - -last 2D echo revealed an EF of 55 to 60% and grade 1 diastolic dysfunction-no suspicion for embolic infarct -Physical therapy recommends skilled nursing facility  Active Problems:   Essential hypertension -Continue carvedilol -hold amlodipine  Coronary artery disease status post stenting-peripheral artery disease-hyperlipidemia - He has been on aspirin and Brilinta since 12/21 when he was admitted with an acute MI which we are continuing - Continue statin    Insulin-requiring or dependent type II diabetes mellitus uncontrolled with hyperglycemia - Continue to aggressively treat diabetes to bring A1c down Continue long-acting insulin with sliding scale insulin while in the hospital    HIV disease ([place]) -Continue Biktarvy and dapsone    CKD (chronic kidney disease) stage 4, GFR 15-29 ml/min -His creatinine is at baseline which appears to range from 2.5-2.9    Hx of BKA, right   Normocytic anemia - Likely anemia of chronic disease    Time spent in minutes: 35 DVT prophylaxis: enoxaparin (LOVENOX) injection 40 mg Start: 04/25/20 1000  Code Status: Full code Family Communication:  Disposition Plan:  Status is: Inpatient  Remains inpatient appropriate because:Inpatient level of care appropriate due to severity of illness   Dispo: The patient is from: Home              Anticipated d/c is to: SNF              Anticipated d/c date is: > 3 days              Patient currently is not medically  stable to d/c.      Consultants:   Neurology Procedures:   2D echo Antimicrobials:  Anti-infectives (From admission, onward)   Start     Dose/Rate Route Frequency Ordered Stop   04/25/20 1000  bictegravir-emtricitabine-tenofovir AF (BIKTARVY) 50-200-25 MG per tablet 1 tablet        1 tablet Oral Daily 04/25/20 0456     04/25/20 1000  dapsone tablet 100 mg        100 mg Oral Daily 04/25/20 0456         Objective: Vitals:   04/27/20 1924 04/27/20 2314  04/28/20 0306 04/28/20 0758  BP: (!) 134/93 130/87 (!) 138/106 115/79  Pulse: 67 (!) 59 63 65  Resp: '18 18 18 18  '$ Temp: 97.8 F (36.6 C) 98 F (36.7 C) 98.3 F (36.8 C) 98.4 F (36.9 C)  TempSrc: Oral Oral Oral Oral  SpO2: 100% 100% 99% 100%  Weight:      Height:        Intake/Output Summary (Last 24 hours) at 04/28/2020 1040 Last data filed at 04/28/2020 0306 Gross per 24 hour  Intake --  Output 900 ml  Net -900 ml   Filed Weights   04/25/20 0132 04/25/20 0134 04/25/20 2353  Weight: 117.9 kg 117.9 kg 117.5 kg    Examination: General exam: Appears comfortable  HEENT: PERRLA, oral mucosa moist, no sclera icterus or thrush Respiratory system: Clear to auscultation. Respiratory effort normal. Cardiovascular system: S1 & S2 heard, RRR.   Gastrointestinal system: Abdomen soft, non-tender, nondistended. Normal bowel sounds. Central nervous system: Alert and oriented.  Left facial droop noted, left arm and left leg strength are 4/5. Extremities: No cyanosis, clubbing or edema Skin: No rashes or ulcers Psychiatry:  Mood & affect appropriate.     Data Reviewed: I have personally reviewed following labs and imaging studies  CBC: Recent Labs  Lab 04/25/20 0126 04/25/20 0128 04/25/20 0619 04/26/20 1228 04/27/20 0802 04/28/20 0451  WBC 7.0  --  7.5 5.2 5.4 6.0  NEUTROABS 3.0  --   --  2.5  --   --   HGB 11.1* 11.9* 10.6* 10.7* 10.8* 10.4*  HCT 36.9* 35.0* 34.9* 34.3* 33.9* 32.5*  MCV 86.6  --  86.8 84.5 85.2 85.3  PLT 272  --  246 257 241 0000000   Basic Metabolic Panel: Recent Labs  Lab 04/25/20 0126 04/25/20 0128 04/25/20 0619 04/26/20 1228 04/27/20 0802 04/28/20 0451  NA 136 140  --  134* 133* 136  K 4.0 4.1  --  4.1 4.7 4.1  CL 103 105  --  103 103 105  CO2 22  --   --  23 20* 21*  GLUCOSE 66* 65*  --  238* 278* 121*  BUN 30* 32*  --  32* 38* 36*  CREATININE 2.80* 2.90* 2.65* 2.30* 2.73* 2.54*  CALCIUM 9.6  --   --  9.1 9.1 9.2  MG  --   --   --  2.0 2.0  2.1   GFR: Estimated Creatinine Clearance: 41.6 mL/min (A) (by C-G formula based on SCr of 2.54 mg/dL (H)). Liver Function Tests: Recent Labs  Lab 04/25/20 0126  AST 15  ALT 12  ALKPHOS 68  BILITOT 0.6  PROT 7.9  ALBUMIN 3.3*   No results for input(s): LIPASE, AMYLASE in the last 168 hours. No results for input(s): AMMONIA in the last 168 hours. Coagulation Profile: Recent Labs  Lab 04/25/20 0126  INR 1.0   Cardiac Enzymes:  No results for input(s): CKTOTAL, CKMB, CKMBINDEX, TROPONINI in the last 168 hours. BNP (last 3 results) No results for input(s): PROBNP in the last 8760 hours. HbA1C: No results for input(s): HGBA1C in the last 72 hours. CBG: Recent Labs  Lab 04/27/20 1121 04/27/20 1637 04/27/20 2102 04/28/20 0601 04/28/20 0958  GLUCAP 254* 71 157* 106* 105*   Lipid Profile: Recent Labs    04/25/20 2030  CHOL 280*  HDL 32*  LDLCALC 205*  TRIG 217*  CHOLHDL 8.8   Thyroid Function Tests: Recent Labs    04/25/20 2030  TSH 2.331   Anemia Panel: Recent Labs    04/26/20 1228  VITAMINB12 451  FOLATE 8.7  FERRITIN 347*  TIBC 276  IRON 71  RETICCTPCT 1.8   Urine analysis:    Component Value Date/Time   COLORURINE YELLOW 04/25/2020 0755   APPEARANCEUR HAZY (A) 04/25/2020 0755   LABSPEC 1.014 04/25/2020 0755   PHURINE 5.0 04/25/2020 0755   GLUCOSEU >=500 (A) 04/25/2020 0755   HGBUR SMALL (A) 04/25/2020 0755   BILIRUBINUR NEGATIVE 04/25/2020 0755   BILIRUBINUR negative 09/07/2018 1034   KETONESUR NEGATIVE 04/25/2020 0755   PROTEINUR >=300 (A) 04/25/2020 0755   UROBILINOGEN 1.0 09/07/2018 1034   UROBILINOGEN 1.0 01/01/2017 1032   NITRITE NEGATIVE 04/25/2020 0755   LEUKOCYTESUR NEGATIVE 04/25/2020 0755   Sepsis Labs: '@LABRCNTIP'$ (procalcitonin:4,lacticidven:4) ) Recent Results (from the past 240 hour(s))  Resp Panel by RT-PCR (Flu A&B, Covid) Nasopharyngeal Swab     Status: None   Collection Time: 04/25/20  4:35 AM   Specimen:  Nasopharyngeal Swab; Nasopharyngeal(NP) swabs in vial transport medium  Result Value Ref Range Status   SARS Coronavirus 2 by RT PCR NEGATIVE NEGATIVE Final    Comment: (NOTE) SARS-CoV-2 target nucleic acids are NOT DETECTED.  The SARS-CoV-2 RNA is generally detectable in upper respiratory specimens during the acute phase of infection. The lowest concentration of SARS-CoV-2 viral copies this assay can detect is 138 copies/mL. A negative result does not preclude SARS-Cov-2 infection and should not be used as the sole basis for treatment or other patient management decisions. A negative result may occur with  improper specimen collection/handling, submission of specimen other than nasopharyngeal swab, presence of viral mutation(s) within the areas targeted by this assay, and inadequate number of viral copies(<138 copies/mL). A negative result must be combined with clinical observations, patient history, and epidemiological information. The expected result is Negative.  Fact Sheet for Patients:  EntrepreneurPulse.com.au  Fact Sheet for Healthcare Providers:  IncredibleEmployment.be  This test is no t yet approved or cleared by the [place] FDA and  has been authorized for detection and/or diagnosis of SARS-CoV-2 by FDA under an Emergency Use Authorization (EUA). This EUA will remain  in effect (meaning this test can be used) for the duration of the COVID-19 declaration under Section 564(b)(1) of the Act, 21 U.S.C.section 360bbb-3(b)(1), unless the authorization is terminated  or revoked sooner.       Influenza A by PCR NEGATIVE NEGATIVE Final   Influenza B by PCR NEGATIVE NEGATIVE Final    Comment: (NOTE) The Xpert Xpress SARS-CoV-2/FLU/RSV plus assay is intended as an aid in the diagnosis of influenza from Nasopharyngeal swab specimens and should not be used as a sole basis for treatment. Nasal washings and aspirates are unacceptable for  Xpert Xpress SARS-CoV-2/FLU/RSV testing.  Fact Sheet for Patients: EntrepreneurPulse.com.au  Fact Sheet for Healthcare Providers: IncredibleEmployment.be  This test is not yet approved or cleared by the [place] and  has been authorized for detection and/or diagnosis of SARS-CoV-2 by FDA under an Emergency Use Authorization (EUA). This EUA will remain in effect (meaning this test can be used) for the duration of the COVID-19 declaration under Section 564(b)(1) of the Act, 21 U.S.C. section 360bbb-3(b)(1), unless the authorization is terminated or revoked.  Performed at [facility], [place] [address] [place], [place] [address]          Radiology Studies: CT HEAD WO CONTRAST  Result Date: 04/27/2020 CLINICAL DATA:  Left-sided weakness EXAM: CT HEAD WITHOUT CONTRAST TECHNIQUE: Contiguous axial images were obtained from the base of the skull through the vertex without intravenous contrast. COMPARISON:  04/25/2020 FINDINGS: Brain: More well-defined area of decreased attenuation is identified in the right half of the pons similar to that seen on prior MRI examination consistent with evolving ischemia. No new area of hemorrhage, infarct or space-occupying mass lesion is seen. Vascular: No hyperdense vessel or unexpected calcification. Skull: Normal. Negative for fracture or focal lesion. Sinuses/Orbits: No acute finding. Other: None. IMPRESSION: Better defined area of decreased attenuation in the right half of the pons consistent with evolving ischemia. No new focal abnormality is noted. Electronically Signed   By: Inez Catalina M.D.   On: 04/27/2020 16:01      Scheduled Meds: .  stroke: mapping our early stages of recovery book   Does not apply Once  . aspirin EC  81 mg Oral Daily  . atorvastatin  80 mg Oral q1800  . bictegravir-emtricitabine-tenofovir AF  1 tablet Oral Daily  . carvedilol  25 mg Oral BID WC  . dapsone  100 mg Oral  Daily  . enoxaparin (LOVENOX) injection  40 mg Subcutaneous Q24H  . gabapentin  100 mg Oral TID  . icosapent Ethyl  2 g Oral BID  . insulin aspart  0-5 Units Subcutaneous QHS  . insulin aspart  0-9 Units Subcutaneous TID WC  . insulin detemir  40 Units Subcutaneous Daily  . oxybutynin  5 mg Oral BID  . ticagrelor  90 mg Oral BID   Continuous Infusions: . sodium chloride 125 mL/hr at 04/28/20 0255     LOS: 2 days      Debbe Odea, MD [facility] Pager: [email or web address] 04/28/2020, 10:40 AM

## 2020-04-28 NOTE — Progress Notes (Signed)
Inpatient Rehab Admissions Coordinator:   I met with Pt. For ongoing discussion regarding potential CIR admit. I do not yet have insurance authorization for CIR and do not have a bed for this Pt. Today. I  Have not been able to confirm support for this Pt. But left a voicemail with request for callback with pt.'s mother.   Laura Staley, MS, CCC-SLP Rehab Admissions Coordinator  [phone] (celll) [phone] (office)  

## 2020-04-28 NOTE — PMR Pre-admission (Shared)
PMR Admission Coordinator Pre-Admission Assessment  Patient: Keith [name] is an 55 y.o., male MRN: [id number] DOB: 1966-01-18 Height: 5\' 9"  (175.3 cm) Weight: 117.5 kg  Insurance Information HMO:   PPO: ***     PCP: ***     IPA: ***     80/20: ***     OTHER: *** PRIMARY: [employer] Medicare      Policy#: [id number]    Subscriber: Pt CM Name:      Phone#: ***     Fax#: *** Pre-Cert#: *** (pending)      Employer: *** Benefits:  Phone #: ***     Name: *** Eff. Date:  05/10/2019 - present   Deductible: $0 ($0 met) OOP Max: $0 ($0 met) CIR: $250 co-pay per day (Max 4 days), 100% coverage, 0% co-insurance SNF: $188 co-pay per day, 100% coverage, 0% co-insurance OP: $20 co-pay, 100% coverage, 0% co-insurance HH: $0 co-pay, 100% coverage, 0% co-insurance DME: 80% coverage, 20% co-insurance Providers: in network  SECONDARY: Medicaid [place] [facility]      [phone] p:      Phone#:   Development worker, community:       Phone#:   The Engineer, petroleum" for patients in Inpatient Rehabilitation Facilities with attached "Privacy Act [place] Records" was provided and verbally reviewed with: Patient  Emergency Contact Information Contact Information    Name Relation Home Work Mobile   Mazo,Bonnie Mother   [phone]      Current Medical History  Patient Admitting Diagnosis: CVA History of Present Illness:  Keith [name] is a 55 y.o. 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  Per chart review patient lives with roommate.  1 level home 3 steps to entry.  Reportedly independent with prosthesis.  Presented 04/25/2020 with left-sided weakness facial droop and slurred speech.  CT/MRI suspicious of subtle acute small vessel ischemia in the right pons with a nearby chronic right paracentral pontine infarction.  Patient did not receive tPA.  MRA of  head and neck positive for evidence of atherosclerosis in the mid basilar artery at the distal right ICA siphon and right ACA A2 segment.  Admission chemistries glucose 66 BUN 30 creatinine 2.80, hemoglobin 11.1, alcohol negative, urine drug screen negative.  Echocardiogram with ejection fraction of 50 to 55% no wall motion abnormality grade 1 diastolic dysfunction.  Neurology follow-up currently maintained on aspirin and Brilinta for CVA prophylaxis.  Subcutaneous Lovenox for DVT prophylaxis.  Therapy evaluations completed recommendations of physical medicine rehab consult secondary to left-sided weakness facial droop and slurred speech.  Complete NIHSS TOTAL: 7  Patient[facility] has been reviewed by the rehabilitation admission coordinator and physician.  Past Medical History  Past Medical History:  Diagnosis Date  . Abscess of right foot    abscess/ulcer of R transtibial amputation requiring IV abx  . AIDS ([place])   . Anemia   . CAD (coronary artery disease)    a. MI with stenting of OM1 in 11/2018 with residual disease. b. acute STEMI 03/2020 s/p DES to OM1  . Chronic anemia   . Chronic knee pain    right  . Chronic pain   . CKD (chronic kidney disease), stage IV ([place])   . Diabetes type 2, uncontrolled ([place])    HgA1c 17.6 (04/27/2010)  . Diabetic foot ulcer ([place]) 01/2017   right foot  . Dilatation of aorta ([facility])   .  Erectile dysfunction   . Genital warts   . HIV (human immunodeficiency virus infection) ([place]) 2009   CD4 count 100, VL 13800 (05/01/2010)  . Hyperlipidemia   . Hypertension   . Noncompliance with medication regimen   . Osteomyelitis ([place])    h/o hand  . Osteomyelitis of metatarsal ([place]) 04/28/2017    Family History   family history includes Arthritis in his father; Cancer (age of onset: 70) in his maternal grandmother; Depression in his paternal grandmother; Hypertension in his brother, father, and mother.  Prior  Rehab/Hospitalizations Has the patient had prior rehab or hospitalizations prior to admission? Yes  Has the patient had major surgery during 100 days prior to admission? Yes   Current Medications  Current Facility-Administered Medications:  .   stroke: mapping our early stages of recovery book, , Does not apply, Once, Rise Patience, MD .  0.9 %  sodium chloride infusion, , Intravenous, Continuous, Eugenie Filler, MD, Last Rate: 125 mL/hr at 04/28/20 0255, New Bag at 04/28/20 0255 .  acetaminophen (TYLENOL) tablet 650 mg, 650 mg, Oral, Q4H PRN **OR** acetaminophen (TYLENOL) 160 MG/5ML solution 650 mg, 650 mg, Per Tube, Q4H PRN **OR** acetaminophen (TYLENOL) suppository 650 mg, 650 mg, Rectal, Q4H PRN, Rise Patience, MD .  aspirin EC tablet 81 mg, 81 mg, Oral, Daily, Rise Patience, MD, 81 mg at 04/28/20 1026 .  atorvastatin (LIPITOR) tablet 80 mg, 80 mg, Oral, q1800, Rise Patience, MD, 80 mg at 04/27/20 1717 .  bictegravir-emtricitabine-tenofovir AF (BIKTARVY) 50-200-25 MG per tablet 1 tablet, 1 tablet, Oral, Daily, Rise Patience, MD, 1 tablet at 04/28/20 1026 .  carvedilol (COREG) tablet 25 mg, 25 mg, Oral, BID WC, Rise Patience, MD, 25 mg at 04/28/20 1026 .  dapsone tablet 100 mg, 100 mg, Oral, Daily, Rise Patience, MD, 100 mg at 04/28/20 1026 .  enoxaparin (LOVENOX) injection 40 mg, 40 mg, Subcutaneous, Q24H, Rise Patience, MD, 40 mg at 04/28/20 1027 .  gabapentin (NEURONTIN) capsule 100 mg, 100 mg, Oral, TID, Rise Patience, MD, 100 mg at 04/28/20 1026 .  icosapent Ethyl (VASCEPA) 1 g capsule 2 g, 2 g, Oral, BID, Rise Patience, MD, 2 g at 04/28/20 1025 .  insulin aspart (novoLOG) injection 0-5 Units, 0-5 Units, Subcutaneous, QHS, Dahal, Marlowe Aschoff, MD, 3 Units at 04/26/20 2146 .  insulin aspart (novoLOG) injection 0-9 Units, 0-9 Units, Subcutaneous, TID WC, Dahal, Binaya, MD, 5 Units at 04/27/20 1130 .  insulin detemir  (LEVEMIR) injection 40 Units, 40 Units, Subcutaneous, Daily, Eugenie Filler, MD, 40 Units at 04/28/20 1027 .  nitroGLYCERIN (NITROSTAT) SL tablet 0.4 mg, 0.4 mg, Sublingual, Q5 Min x 3 PRN, Rise Patience, MD .  oxybutynin (DITROPAN) tablet 5 mg, 5 mg, Oral, BID, Rise Patience, MD, 5 mg at 04/28/20 1026 .  oxyCODONE (Oxy IR/ROXICODONE) immediate release tablet 10 mg, 10 mg, Oral, Q5H PRN, Rise Patience, MD, 10 mg at 04/26/20 1816 .  ticagrelor (BRILINTA) tablet 90 mg, 90 mg, Oral, BID, Rise Patience, MD, 90 mg at 04/28/20 1026  Patients Current Diet:  Diet Order            Diet Carb Modified Fluid consistency: Thin; Room service appropriate? Yes  Diet effective now                 Precautions / Restrictions Precautions Precautions: Fall Precaution Comments: Monitor vitals, L-hemiplegia, dysarthric, hx of R BKA Other Brace: R prosthesis  in room Restrictions Weight Bearing Restrictions: No   Has the patient had 2 or more falls or a fall with injury in the past year? Yes  Prior Activity Level  Pt. Was working as a Building control surveyor and driving PTA  Prior Functional Level Self Care: Did the patient need help bathing, dressing, using the toilet or eating? Independent  Indoor Mobility: Did the patient need assistance with walking from room to room (with or without device)? Independent  Stairs: Did the patient need assistance with internal or external stairs (with or without device)? Independent  Functional Cognition: Did the patient need help planning regular tasks such as shopping or remembering to take medications? Independent  Home Assistive Devices / Equipment Home Assistive Devices/Equipment: Prosthesis (right leg) Home Equipment: Shower seat  Prior Device Use: Indicate devices/aids used by the patient prior to current illness, exacerbation or injury? None of the above  Current Functional Level Cognition  Arousal/Alertness: Awake/alert Overall  Cognitive Status: Impaired/Different from baseline Current Attention Level: Sustained (Easily distracted by environment) Orientation Level: Oriented X4 Following Commands: Follows one step commands inconsistently,Follows one step commands with increased time Safety/Judgement: Decreased awareness of safety,Decreased awareness of deficits (tries to say he can walk without assistance) General Comments: Patient unaware of worsened deficits despite education. Patient does not understand why he now requires +2 assist. Attention: Focused,Sustained Focused Attention: Impaired Focused Attention Impairment: Verbal complex,Functional complex Sustained Attention: Impaired Sustained Attention Impairment: Verbal complex,Functional complex Memory: Impaired Memory Impairment: Decreased recall of new information,Decreased short term memory Awareness: Impaired Awareness Impairment: Emergent impairment Problem Solving: Impaired Problem Solving Impairment: Verbal complex,Functional complex Executive Function: Organizing,Sequencing Sequencing: Impaired Sequencing Impairment: Verbal complex,Functional complex Organizing: Impaired Organizing Impairment: Verbal complex,Functional complex Safety/Judgment: Impaired    Extremity Assessment (includes Sensation/Coordination)  Upper Extremity Assessment: LUE deficits/detail LUE Deficits / Details: Flaccid paralysis. No activation with shrugging. Floppy wrist. LUE Sensation: decreased light touch LUE Coordination: decreased fine motor,decreased gross motor  Lower Extremity Assessment: Defer to PT evaluation LLE Deficits / Details: hip flex 3-/5, knee ext 3/5, ankle 3/5, diffculty taking full body wt LLE in standing LLE Sensation: decreased proprioception LLE Coordination: decreased fine motor,decreased gross motor    ADLs  Overall ADL's : Needs assistance/impaired Eating/Feeding: Set up,Sitting Grooming: Set up,Supervision/safety,Sitting Upper Body Bathing:  Set up,Supervision/ safety,Sitting Lower Body Bathing: +2 for safety/equipment,Sit to/from stand,Moderate assistance Upper Body Dressing : Set up,Supervision/safety,Sitting Lower Body Dressing: Sit to/from stand,Moderate assistance Lower Body Dressing Details (indicate cue type and reason): Pt donning his prothetic, Mod A +2 for balance in standing Toilet Transfer: Maximal assistance,+2 for physical assistance,+2 for safety/equipment Toilet Transfer Details (indicate cue type and reason): Stedy Toileting- Clothing Manipulation and Hygiene: Maximal assistance,+2 for safety/equipment Toileting - Clothing Manipulation Details (indicate cue type and reason): Patient incontinent of bowel and bladder. Seemingly unaware. Max A +2 for sit to stand in stedy for hygiene/clothing management. Functional mobility during ADLs: Moderate assistance,+2 for physical assistance General ADL Comments: Mobility deferred 2/2 new onset worsening L-sided weakness.    Mobility  Overal bed mobility: Needs Assistance Bed Mobility: Supine to Sit Supine to sit: HOB elevated,Min guard Sit to supine: Min assist General bed mobility comments: HOB elevated and min guard for safety, requiring extra time to manage legs with cues to pull up on bed rail to ascend trunk.    Transfers  Overall transfer level: Needs assistance Equipment used: Rolling walker (2 wheeled) Transfer via Lift Equipment: Stedy Transfers: Sit to/from Stand Sit to Stand: Max assist,+2 physical assistance,+2 safety/equipment Stand  pivot transfers: Mod assist Squat pivot transfers: Max assist,From elevated surface General transfer comment: Max A +2 for sit to stand in stedy and for sit to stand from perched position in stedy. Mirror for visual feedback 2/2 poor orientation to midline. Only able to maintain standing for 10-15 sec bouts.    Ambulation / Gait / Stairs / Wheelchair Mobility  Ambulation/Gait Ambulation/Gait assistance: Mod assist Gait  Distance (Feet): 32 Feet (x4 bouts of ~2 ft > ~16 ft > ~16 ft > ~32 ft, seated rest break between bouts) Assistive device: Rolling walker (2 wheeled) Gait Pattern/deviations: Decreased weight shift to left,Decreased stance time - left,Step-through pattern,Decreased step length - right,Decreased step length - left,Decreased stride length,Trunk flexed General Gait Details: Unable to ambulate safely this date. Gait velocity: decreased Gait velocity interpretation: <1.31 ft/sec, indicative of household ambulator    Posture / Balance Dynamic Sitting Balance Sitting balance - Comments: 1-no UE support, reaching off BOS to donn prosthesis and L sock, inc difficulty with L sock. Balance Overall balance assessment: Needs assistance Sitting-balance support: Feet supported,No upper extremity supported,Single extremity supported Sitting balance-Leahy Scale: Fair Sitting balance - Comments: 1-no UE support, reaching off BOS to donn prosthesis and L sock, inc difficulty with L sock. Standing balance support: During functional activity,Bilateral upper extremity supported Standing balance-Leahy Scale: Poor Standing balance comment: Reliant on UE support and external assist    Special needs/care consideration Skin ***, Diabetic management *** and Designated visitor ***   Previous Home Environment (from acute therapy documentation) Living Arrangements: Non-relatives/Friends  Lives With: Family,Friend(s) Available Help at Discharge: Friend(s),Available PRN/intermittently Type of Home: House Home Layout: One level Home Access: Stairs to enter Entrance Stairs-Rails: Surveyor, mining of Steps: 3 Bathroom Shower/Tub: Chiropodist: [place]: No  Discharge Living Setting Plans for Discharge Living Setting: House Type of Home at Discharge: House Discharge Home Layout: One level Discharge Home Access: Stairs to enter Entrance Stairs-Rails: Can reach  both Entrance Stairs-Number of Steps: 5 Discharge Bathroom Shower/Tub: Tub/shower unit Discharge Bathroom Toilet: Standard Discharge Bathroom Accessibility: Yes How Accessible: Accessible via walker Does the patient have any problems obtaining your medications?: No  Social/Family/Support Systems Patient Roles: Parent Contact Information: [phone] Anticipated Caregiver: Elven Laboy  (mother) Anticipated Caregiver's Contact Information: [phone]  Goals Patient/Family Goal for Rehab: PT/OT Mod A Expected length of stay: 18-21 days Pt/Family Agrees to Admission and willing to participate: Yes Program Orientation Provided & Reviewed with Pt/Caregiver Including Roles  & Responsibilities: Yes  Decrease burden of Care through IP rehab admission: Specialzed equipment needs, Decrease number of caregivers, Bowel and bladder program and Patient/family education  Possible need for SNF placement upon discharge: not anticipated  Patient Condition: {PATIENT'S CONDITION:22832}  Preadmission Screen Completed By:  Genella Mech, 04/28/2020 2:03 PM ______________________________________________________________________   Discussed status with Dr. Marland Kitchen on *** at *** and received approval for admission today.  Admission Coordinator:  Genella Mech, CCC-SLP, time ***Sudie Grumbling ***   Assessment/Plan: Diagnosis: 1. Does the need for close, 24 hr/day Medical supervision in concert with the patient's rehab needs make it unreasonable for this patient to be served in a less intensive setting? {yes_no_potentially:3041433} 2. Co-Morbidities requiring supervision/potential complications: *** 3. Due to {due WV:3710626}, does the patient require 24 hr/day rehab nursing? {yes_no_potentially:3041433} 4. Does the patient require coordinated care of a physician, rehab nurse, PT, OT, and SLP to address physical and functional deficits in the context of the above medical diagnosis(es)?  {yes_no_potentially:3041433} Addressing deficits in the following  areas: {deficits:3041436} 5. Can the patient actively participate in an intensive therapy program of at least 3 hrs of therapy 5 days a week? {yes_no_potentially:3041433} 6. The potential for patient to make measurable gains while on inpatient rehab is {potential:3041437} 7. Anticipated functional outcomes upon discharge from inpatient rehab: {functional outcomes:304600100} PT, {functional outcomes:304600100} OT, {functional outcomes:304600100} SLP 8. Estimated rehab length of stay to reach the above functional goals is: *** 9. Anticipated discharge destination: {anticipated dc setting:21604} 10. Overall Rehab/Functional Prognosis: {potential:3041437}   MD Signature: ***

## 2020-04-28 NOTE — Care Management Important Message (Signed)
Important Message  Patient Details  Name: Keith [name] MRN: [id number] Date of Birth: 12/11/1965   Medicare Important Message Given:  Yes     Kassie Keng 04/28/2020, 2:42 PM

## 2020-04-28 NOTE — Progress Notes (Addendum)
Physical Therapy Treatment Patient Details Name: Keith [name] MRN: [name] DOB: 1965-09-12 Today's Date: 04/28/2020    History of Present Illness Pt presents with L sided weakness UE and LE, facial droop, and slurred speech.  CT neg. MRI suspicious for CVA R pons. PMH - DM, CAD, HIV, rt BKA, CKD, back pain, HTN, diabetic neuropathy, STEMI s/p stent one mo ago.    PT Comments    Pt easily distracted and would benefit from his phone being on silent and minimal distractions during sessions. Pt displaying improved L sided strength this date compared to yesterday, but still a decline since 2 days ago. He was able to extend his L knee against gravity partially today, allowing for him to utilize it to extend to come to stand with use of a stedy. Sit to stand 1x from EOB > stedy with modAx2 but 4x from stedy with only modAx1. Pt also demonstrates improved core control via being able to transition supine > sit EOB with HOB elevated and maintain his static sitting balance with 1-no UE support with only min guard assist. However, he continues to display poor trunk control when attempting to reach off BOS, resulting in LOB and assistance to recover. Will continue to follow acutely. Current recommendations remain appropriate.   Follow Up Recommendations  CIR;Supervision for mobility/OOB     Equipment Recommendations  Rolling walker with 5" wheels    Recommendations for Other Services       Precautions / Restrictions Precautions Precautions: Fall Precaution Comments: hx of R BKA Required Braces or Orthoses: Other Brace Other Brace: R prosthesis in room Restrictions Weight Bearing Restrictions: No    Mobility  Bed Mobility Overal bed mobility: Needs Assistance Bed Mobility: Supine to Sit     Supine to sit: HOB elevated;Min guard     General bed mobility comments: Pt cued to bring legs off R EOB and to utilize bed rail to come to sit, success with extra time and effort on L side, min  guard for safety.  Transfers Overall transfer level: Needs assistance Equipment used: Ambulation equipment used Transfers: Sit to/from [facility] Sit to Stand: Mod assist;+2 physical assistance;+2 safety/equipment Stand pivot transfers: Total assist;+2 physical assistance;+2 safety/equipment       General transfer comment: Sit to stand 1x from EOB requiring assistance for feet placement on stedy platform and cues for hands on blue bar, hand-over-hand to maintain L grip. ModAx2 to power up to stand from EOB to stedy. ModAx1 to perform sit to stand 4x from stedy. Cues at L quad with improved contraction this date. TA pivoting pt sitting on stedy to chair from bed.  Ambulation/Gait             General Gait Details: deferred due to safety concerns   Stairs             Wheelchair Mobility    Modified Rankin (Stroke Patients Only) Modified Rankin (Stroke Patients Only) Pre-Morbid Rankin Score: No symptoms Modified Rankin: Severe disability     Balance Overall balance assessment: Needs assistance Sitting-balance support: Feet supported;Single extremity supported Sitting balance-Leahy Scale: Poor Sitting balance - Comments: Pt alternating R UE to no UE support, min guard for safety, mild trunk sway.   Standing balance support: During functional activity;Bilateral upper extremity supported Standing balance-Leahy Scale: Poor Standing balance comment: Min-modAx1 to maintain static standing balance with UEs on stedy while being provided knee blocks by stedy.  Cognition Arousal/Alertness: Awake/alert Behavior During Therapy: Flat affect Overall Cognitive Status: Impaired/Different from baseline Area of Impairment: Attention;Memory;Following commands;Safety/judgement;Awareness;Problem solving                   Current Attention Level: Sustained (easily distracted by environment) Memory: Decreased short-term  memory;Decreased recall of precautions Following Commands: Follows one step commands inconsistently;Follows one step commands with increased time Safety/Judgement: Decreased awareness of safety;Decreased awareness of deficits Awareness: Emergent Problem Solving: Slow processing;Requires verbal cues;Difficulty sequencing;Requires tactile cues General Comments: Pt with flat affect and poor awareness of his deficits placing him at risk for falls/injury. Pt easily distracted by phone and other personnel or his prosthesis.      Exercises      General Comments        Pertinent Vitals/Pain Pain Assessment: Faces Faces Pain Scale: Hurts a little bit Pain Location: Generalized with mobility Pain Descriptors / Indicators: Discomfort;Grimacing Pain Intervention(s): Monitored during session;Limited activity within patient's tolerance;Repositioned    Home Living                      Prior Function            PT Goals (current goals can now be found in the care plan section) Acute Rehab PT Goals Patient Stated Goal: to improve PT Goal Formulation: With patient Time For Goal Achievement: 05/09/20 Potential to Achieve Goals: Good Progress towards PT goals: Progressing toward goals    Frequency    Min 4X/week      PT Plan Current plan remains appropriate    Co-evaluation              AM-PAC PT "6 Clicks" Mobility   Outcome Measure  Help needed turning from your back to your side while in a flat bed without using bedrails?: A Little Help needed moving from lying on your back to sitting on the side of a flat bed without using bedrails?: A Little Help needed moving to and from a bed to a chair (including a wheelchair)?: A Lot Help needed standing up from a chair using your arms (e.g., wheelchair or bedside chair)?: A Lot Help needed to walk in hospital room?: Total Help needed climbing 3-5 steps with a railing? : Total 6 Click Score: 12    End of Session  Equipment Utilized During Treatment: Gait belt Activity Tolerance: Patient tolerated treatment well Patient left: with call bell/phone within reach;in chair;with chair alarm set Nurse Communication: Mobility status;Need for lift equipment PT Visit Diagnosis: Unsteadiness on feet (R26.81);Hemiplegia and hemiparesis;Difficulty in walking, not elsewhere classified (R26.2);Other abnormalities of gait and mobility (R26.89);Muscle weakness (generalized) (M62.81);Other symptoms and signs involving the nervous system (R29.898) Hemiplegia - Right/Left: Left Hemiplegia - dominant/non-dominant: Non-dominant Hemiplegia - caused by: Cerebral infarction     Time: [id number] PT Time Calculation (min) (ACUTE ONLY): 25 min  Charges:  $Therapeutic Activity: 23-37 mins                     Moishe Spice, PT, DPT Acute Rehabilitation Services  Pager: [phone] Office: [place] 04/28/2020, 2:25 PM

## 2020-04-28 NOTE — Telephone Encounter (Signed)
Patient is still in hospital - we will not follow up on discharge medications from 03/27/2020.  Laurey Arrow  [facility] PharmD Candidate [phone]

## 2020-04-28 NOTE — Progress Notes (Signed)
Inpatient Rehab Admissions Coordinator:   I spoke with pt.'s mother who states she can provide supervision but can provide very little physical assist.  He does not have a safe disposition following CIR,  And payor source will not pay for both CIR and SNF. CIR will sign off at this time. I have notified Pt. And family. Pt.'s mother is interested in SNFs in [place]. I have also notified case Freight forwarder.   Clemens Catholic, [place], [place] Admissions Coordinator  [phone] ([place]) [phone] (office)

## 2020-04-29 DIAGNOSIS — I639 Cerebral infarction, unspecified: Secondary | ICD-10-CM | POA: Diagnosis not present

## 2020-04-29 LAB — GLUCOSE, CAPILLARY
Glucose-Capillary: 189 mg/dL — ABNORMAL HIGH (ref 70–99)
Glucose-Capillary: 220 mg/dL — ABNORMAL HIGH (ref 70–99)
Glucose-Capillary: 188 mg/dL — ABNORMAL HIGH (ref 70–99)
Glucose-Capillary: 158 mg/dL — ABNORMAL HIGH (ref 70–99)
Glucose-Capillary: 145 mg/dL — ABNORMAL HIGH (ref 70–99)

## 2020-04-29 NOTE — Progress Notes (Signed)
STROKE TEAM PROGRESS NOTE   INTERVAL HISTORY Patient is sitting up in bed.  Continues to have left hemiparesis but his slurred speech has improved.  Left leg seems to be still weak though left arm is improving though he still has significant left grip and hand weakness.  He did participate in the sleep smart study and underwent the NOX 3 monitor overnight but results are yet awaited.  Vital signs stable.  Neuro exam unchanged.    Vitals:   04/28/20 2026 04/28/20 2301 04/29/20 0457 04/29/20 0820  BP: 131/77 140/89 (!) 150/89 (!) 139/96  Pulse: 66 64 61 65  Resp: 16 18 (!) 22 20  Temp: 97.6 F (36.4 C) 97.7 F (36.5 C) 97.8 F (36.6 C) 98.2 F (36.8 C)  TempSrc: Oral Oral Oral Oral  SpO2: 100% 98% 100% 100%  Weight:      Height:       CBC:  Recent Labs  Lab 04/25/20 0126 04/25/20 0128 04/26/20 1228 04/27/20 0802 04/28/20 0451  WBC 7.0   < > 5.2 5.4 6.0  NEUTROABS 3.0  --  2.5  --   --   HGB 11.1*   < > 10.7* 10.8* 10.4*  HCT 36.9*   < > 34.3* 33.9* 32.5*  MCV 86.6   < > 84.5 85.2 85.3  PLT 272   < > 257 241 243   < > = values in this interval not displayed.   Basic Metabolic Panel:  Recent Labs  Lab 04/27/20 0802 04/28/20 0451  NA 133* 136  K 4.7 4.1  CL 103 105  CO2 20* 21*  GLUCOSE 278* 121*  BUN 38* 36*  CREATININE 2.73* 2.54*  CALCIUM 9.1 9.2  MG 2.0 2.1   Lab Results  Component Value Date   CHOL 280 (H) 04/25/2020   HDL 32 (L) 04/25/2020   LDLCALC 205 (H) 04/25/2020   TRIG 217 (H) 04/25/2020   CHOLHDL 8.8 04/25/2020    Lab Results  Component Value Date   HGBA1C >15.5 (H) 03/24/2020   Urine Drug Screen:  Recent Labs  Lab 04/25/20 0755  LABOPIA NONE DETECTED  COCAINSCRNUR NONE DETECTED  LABBENZ NONE DETECTED  AMPHETMU NONE DETECTED  THCU NONE DETECTED  LABBARB NONE DETECTED     Alcohol Level  Recent Labs  Lab 04/25/20 0358  ETH <10    IMAGING past 24 hours No results found.  PHYSICAL EXAM Obese middle-aged African-American male  not in distress. . Afebrile. Head is nontraumatic. Neck is supple without bruit.    Cardiac exam no murmur or gallop. Lungs are clear to auscultation. Distal pulses are well felt. Neurological Exam : Is awake alert moderately dysarthric speech. He is oriented to time place and person. .no aphasia. Follows commands well. Extraocular movements are full range without nystagmus.  Mild left lower facial weakness tongue is midline. Motor system exam shows dense left hemiplegia with  3/5 strength in proximal left upper extremity but significant weakness of left wrist grip and intrinsic hand muscles.  And lower extremity 2/5 strength with decreased tone.  Normal antigravity strength on the right.. Sensation intact bilaterally. Reflexes are symmetric. Plantars downgoing. Gait not tested.  NIH stroke scale 6.  Premorbid baseline modified Rankin 1   ASSESSMENT/PLAN Mr. ZANDER RANSBURG is a 55 y.o. male with history of HIV, HTN, CAD/MI s/p stent, DM 2 with vascular and neurologic complications presenting with left sided weakness and slurred speech. Possible metabolic and functional etiology.  Stroke:   Small R  pontine infarct secondary to small vessel disease source Acute neurological worsening on 04/27/2020.  Repeat CT scan yesterday showed expected evolutionary changes in the right paramedian pontine infarct without any acute abnormality.  Code Stroke CT head No acute abnormality. ASPECTS 10.   Repeat HCT 04/27/20: Better defined area of decreased attenuation in the right half ofthe pons consistent with evolving ischemia.  No new focal abnormality is noted  MRI  Possible small R pontine infarct. Old R paracentral pontine lacune.   MRA head & neck   Atherosclerosis mild mid BA, mild to moderate distal R ICA siphon and mild R A2 stenoses  2D Echo w/ bubble ejection fraction 50 to 55%.  No wall motion abnormalities.  Mild left ventricular concentric hypertrophy LDL   205  HgbA1c >15.5 03/24/2020  VTE  prophylaxis - Lovenox 40 mg sq daily   aspirin 81 mg daily and Brilinta (ticagrelor) 90 mg bid prior to admission for cardiac revascularization in 03/2020, now on aspirin 81 mg daily and Brilinta (ticagrelor) 90 mg bid. Continue DAPT at d/c.   Therapy recommendations: SNF   Disposition: SNF  Hypertension  Stable on the high side . Permissive hypertension (OK if < 220/120) but gradually normalize in 5-7 days . Long-term BP goal normotensive  Hyperlipidemia  Home meds:  Lipitor 80, resumed in hospital  LDL 199 03/21/2020, goal < 70  Continue statin at discharge  Diabetes type II Uncontrolled  HgbA1c >15.5 03/24/2020, goal < 7.0  CBGs Recent Labs    04/28/20 1753 04/28/20 2123 04/29/20 0610  GLUCAP 154* 177* 189*      SSI  Other Stroke Risk Factors  Hx ETOH use, alcohol level <10, advised to drink no more than 2 drink(s) a day  Hx drug use  Obesity, Body mass index is 38.25 kg/m., BMI >/= 30 associated with increased stroke risk, recommend weight loss, diet and exercise as appropriate   Coronary artery disease s/p MI w/ recent stent on aspirin and Brilinta, BB 03/2020  HIV on Biktarvy and dapsone.  R BKA  Other Active Problems, Findings, Recommendations and/or Plan  CKD stage IV w anemia of chronic dz  Hx chronic pain   Sleep Smart Study recommended    Hospital day #3 Continue ongoing therapies.  Await results of sleep smart sleep apnea screening test.  Transfer to inpatient rehab when bed available over the next few days.  Greater than 50% time during this 25-minute visit was spent in counseling and coordination of care about his lacunar stroke and questions about stroke and sleep apnoea Antony Contras, MD To contact Stroke Continuity provider, please refer to [email or web address] After hours, contact General Neurology

## 2020-04-29 NOTE — Progress Notes (Signed)
PROGRESS NOTE    Keith [name]   [id number]  DOB: 02/11/1966  DOA: 04/25/2020 PCP: [name], FNP   Brief Narrative:  Keith [name] is a 55 y.o.malewith PMH significant for HIV, DM2,CKD 4,CAD status post recent stenting last month for ST elevation MI with history of diabetes mellitus type 2, right BKA & chronicanemia.  He presented to the ED via EMS for complaints of left arm weakness that had started in the middle of the night.  He was further noted to have a left-sided facial droop and left leg weakness and slurred speech.  His blood glucose was in the 60s.    A code stroke was called and the patient was evaluated by neurology in the ED.  His neurological symptoms improved while he was in the ED.  However, he has been having episodic progression of his neurological symptoms during his hospital stay.  Initial MRI imaging on 1/18 revealed subtle acute small vessel ischemia in the Right Pons, with a nearby chronic right paracentral pontine lacune (see also #3).  Due to progression of symptoms on 1/20, a CT of the head without contrast was obtained which revealed the following: decreased attenuation in the right half of the pons consistent with evolving ischemia.   Subjective: He has no complaints. No new neurological changes.     Assessment & Plan:   Principal Problem:   Acute cerebrovascular accident (CVA) due to ischemia/atherosclerotic disease -The patient has had a right pontine infarct with symptoms of waxing and waning slurred speech, left facial droop, left arm and left leg weakness -His MRA reveals atherosclerotic disease and stenosis in multiple vessels - Appreciate care from stroke team- recommendations are to continue aspirin 81 mg and Brilinta 90 mg twice daily which were prescribed after his MI/stenting -He has significant risk factors for stroke including uncontrolled diabetes and hyperlipidemia - Cholesterol is 280, LDL 205 and HDL 32 -Hemoglobin  A1c on 12/17 was > 15.5 - -last 2D echo revealed an EF of 55 to 60% and grade 1 diastolic dysfunction-no suspicion for embolic infarct -Physical therapy recommends skilled nursing facility or CIR- CIR states he is not a candidate as he does not have a person (other than his frail mother) to help him at home  Active Problems:   Essential hypertension -Continue carvedilol -hold amlodipine  Coronary artery disease status post stenting-peripheral artery disease-hyperlipidemia - He has been on aspirin and Brilinta since 12/21 when he was admitted with an acute MI which we are continuing - Continue statin    Insulin-requiring or dependent type II diabetes mellitus uncontrolled with hyperglycemia - Continue to aggressively treat diabetes to bring A1c down Continue long-acting insulin with sliding scale insulin while in the hospital    HIV disease ([place]) -Continue Biktarvy and dapsone    CKD (chronic kidney disease) stage 4, GFR 15-29 ml/min -His creatinine is at baseline which appears to range from 2.5-2.9    Hx of BKA, right   Normocytic anemia - Likely anemia of chronic disease    Time spent in minutes: 35 DVT prophylaxis: enoxaparin (LOVENOX) injection 40 mg Start: 04/25/20 1000  Code Status: Full code Family Communication:  Disposition Plan:  Status is: Inpatient  Remains inpatient appropriate because:Inpatient level of care appropriate due to severity of illness   Dispo: The patient is from: Home              Anticipated d/c is to: SNF  Anticipated d/c date is: > 3 days              Patient currently is not medically stable to d/c.      Consultants:   Neurology Procedures:   2D echo Antimicrobials:  Anti-infectives (From admission, onward)   Start     Dose/Rate Route Frequency Ordered Stop   04/25/20 1000  bictegravir-emtricitabine-tenofovir AF (BIKTARVY) 50-200-25 MG per tablet 1 tablet        1 tablet Oral Daily 04/25/20 0456     04/25/20 1000   dapsone tablet 100 mg        100 mg Oral Daily 04/25/20 0456         Objective: Vitals:   04/28/20 2026 04/28/20 2301 04/29/20 0457 04/29/20 0820  BP: 131/77 140/89 (!) 150/89 (!) 139/96  Pulse: 66 64 61 65  Resp: 16 18 (!) 22 20  Temp: 97.6 F (36.4 C) 97.7 F (36.5 C) 97.8 F (36.6 C) 98.2 F (36.8 C)  TempSrc: Oral Oral Oral Oral  SpO2: 100% 98% 100% 100%  Weight:      Height:        Intake/Output Summary (Last 24 hours) at 04/29/2020 1219 Last data filed at 04/29/2020 0900 Gross per 24 hour  Intake 320 ml  Output 500 ml  Net -180 ml   Filed Weights   04/25/20 0132 04/25/20 0134 04/25/20 2353  Weight: 117.9 kg 117.9 kg 117.5 kg    Examination: General exam: Appears comfortable  HEENT: PERRLA, oral mucosa moist, no sclera icterus or thrush Respiratory system: Clear to auscultation. Respiratory effort normal. Cardiovascular system: S1 & S2 heard, regular rate and rhythm Gastrointestinal system: Abdomen soft, non-tender, nondistended. Normal bowel sounds   Central nervous system: Alert and oriented. Left facial droop and left arm and leg weakness noted Extremities: No cyanosis, clubbing or edema Skin: No rashes or ulcers Psychiatry:  Mood & affect appropriate.     Data Reviewed: I have personally reviewed following labs and imaging studies  CBC: Recent Labs  Lab 04/25/20 0126 04/25/20 0128 04/25/20 0619 04/26/20 1228 04/27/20 0802 04/28/20 0451  WBC 7.0  --  7.5 5.2 5.4 6.0  NEUTROABS 3.0  --   --  2.5  --   --   HGB 11.1* 11.9* 10.6* 10.7* 10.8* 10.4*  HCT 36.9* 35.0* 34.9* 34.3* 33.9* 32.5*  MCV 86.6  --  86.8 84.5 85.2 85.3  PLT 272  --  246 257 241 0000000   Basic Metabolic Panel: Recent Labs  Lab 04/25/20 0126 04/25/20 0128 04/25/20 0619 04/26/20 1228 04/27/20 0802 04/28/20 0451  NA 136 140  --  134* 133* 136  K 4.0 4.1  --  4.1 4.7 4.1  CL 103 105  --  103 103 105  CO2 22  --   --  23 20* 21*  GLUCOSE 66* 65*  --  238* 278* 121*  BUN 30*  32*  --  32* 38* 36*  CREATININE 2.80* 2.90* 2.65* 2.30* 2.73* 2.54*  CALCIUM 9.6  --   --  9.1 9.1 9.2  MG  --   --   --  2.0 2.0 2.1   GFR: Estimated Creatinine Clearance: 41.6 mL/min (A) (by C-G formula based on SCr of 2.54 mg/dL (H)). Liver Function Tests: Recent Labs  Lab 04/25/20 0126  AST 15  ALT 12  ALKPHOS 68  BILITOT 0.6  PROT 7.9  ALBUMIN 3.3*   No results for input(s): LIPASE, AMYLASE in the last 168  hours. No results for input(s): AMMONIA in the last 168 hours. Coagulation Profile: Recent Labs  Lab 04/25/20 0126  INR 1.0   Cardiac Enzymes: No results for input(s): CKTOTAL, CKMB, CKMBINDEX, TROPONINI in the last 168 hours. BNP (last 3 results) No results for input(s): PROBNP in the last 8760 hours. HbA1C: No results for input(s): HGBA1C in the last 72 hours. CBG: Recent Labs  Lab 04/28/20 0601 04/28/20 0958 04/28/20 1753 04/28/20 2123 04/29/20 0610  GLUCAP 106* 105* 154* 177* 189*   Lipid Profile: No results for input(s): CHOL, HDL, LDLCALC, TRIG, CHOLHDL, LDLDIRECT in the last 72 hours. Thyroid Function Tests: No results for input(s): TSH, T4TOTAL, FREET4, T3FREE, THYROIDAB in the last 72 hours. Anemia Panel: Recent Labs    04/26/20 1228  VITAMINB12 451  FOLATE 8.7  FERRITIN 347*  TIBC 276  IRON 71  RETICCTPCT 1.8   Urine analysis:    Component Value Date/Time   COLORURINE YELLOW 04/25/2020 0755   APPEARANCEUR HAZY (A) 04/25/2020 0755   LABSPEC 1.014 04/25/2020 0755   PHURINE 5.0 04/25/2020 0755   GLUCOSEU >=500 (A) 04/25/2020 0755   HGBUR SMALL (A) 04/25/2020 0755   BILIRUBINUR NEGATIVE 04/25/2020 0755   BILIRUBINUR negative 09/07/2018 1034   KETONESUR NEGATIVE 04/25/2020 0755   PROTEINUR >=300 (A) 04/25/2020 0755   UROBILINOGEN 1.0 09/07/2018 1034   UROBILINOGEN 1.0 01/01/2017 1032   NITRITE NEGATIVE 04/25/2020 0755   LEUKOCYTESUR NEGATIVE 04/25/2020 0755   Sepsis Labs: '@LABRCNTIP'$ (procalcitonin:4,lacticidven:4) ) Recent  Results (from the past 240 hour(s))  Resp Panel by RT-PCR (Flu A&B, Covid) Nasopharyngeal Swab     Status: None   Collection Time: 04/25/20  4:35 AM   Specimen: Nasopharyngeal Swab; Nasopharyngeal(NP) swabs in vial transport medium  Result Value Ref Range Status   SARS Coronavirus 2 by RT PCR NEGATIVE NEGATIVE Final    Comment: (NOTE) SARS-CoV-2 target nucleic acids are NOT DETECTED.  The SARS-CoV-2 RNA is generally detectable in upper respiratory specimens during the acute phase of infection. The lowest concentration of SARS-CoV-2 viral copies this assay can detect is 138 copies/mL. A negative result does not preclude SARS-Cov-2 infection and should not be used as the sole basis for treatment or other patient management decisions. A negative result may occur with  improper specimen collection/handling, submission of specimen other than nasopharyngeal swab, presence of viral mutation(s) within the areas targeted by this assay, and inadequate number of viral copies(<138 copies/mL). A negative result must be combined with clinical observations, patient history, and epidemiological information. The expected result is Negative.  Fact Sheet for Patients:  EntrepreneurPulse.com.au  Fact Sheet for Healthcare Providers:  IncredibleEmployment.be  This test is no t yet approved or cleared by the [place] FDA and  has been authorized for detection and/or diagnosis of SARS-CoV-2 by FDA under an Emergency Use Authorization (EUA). This EUA will remain  in effect (meaning this test can be used) for the duration of the COVID-19 declaration under Section 564(b)(1) of the Act, 21 U.S.C.section 360bbb-3(b)(1), unless the authorization is terminated  or revoked sooner.       Influenza A by PCR NEGATIVE NEGATIVE Final   Influenza B by PCR NEGATIVE NEGATIVE Final    Comment: (NOTE) The Xpert Xpress SARS-CoV-2/FLU/RSV plus assay is intended as an aid in the  diagnosis of influenza from Nasopharyngeal swab specimens and should not be used as a sole basis for treatment. Nasal washings and aspirates are unacceptable for Xpert Xpress SARS-CoV-2/FLU/RSV testing.  Fact Sheet for Patients: EntrepreneurPulse.com.au  Fact Sheet  for Healthcare Providers: IncredibleEmployment.be  This test is not yet approved or cleared by the [place] and has been authorized for detection and/or diagnosis of SARS-CoV-2 by FDA under an Emergency Use Authorization (EUA). This EUA will remain in effect (meaning this test can be used) for the duration of the COVID-19 declaration under Section 564(b)(1) of the Act, 21 U.S.C. section 360bbb-3(b)(1), unless the authorization is terminated or revoked.  Performed at [facility], [place] [address] [place], [place] [address]          Radiology Studies: CT HEAD WO CONTRAST  Result Date: 04/27/2020 CLINICAL DATA:  Left-sided weakness EXAM: CT HEAD WITHOUT CONTRAST TECHNIQUE: Contiguous axial images were obtained from the base of the skull through the vertex without intravenous contrast. COMPARISON:  04/25/2020 FINDINGS: Brain: More well-defined area of decreased attenuation is identified in the right half of the pons similar to that seen on prior MRI examination consistent with evolving ischemia. No new area of hemorrhage, infarct or space-occupying mass lesion is seen. Vascular: No hyperdense vessel or unexpected calcification. Skull: Normal. Negative for fracture or focal lesion. Sinuses/Orbits: No acute finding. Other: None. IMPRESSION: Better defined area of decreased attenuation in the right half of the pons consistent with evolving ischemia. No new focal abnormality is noted. Electronically Signed   By: Inez Catalina M.D.   On: 04/27/2020 16:01      Scheduled Meds: .  stroke: mapping our early stages of recovery book   Does not apply Once  . aspirin EC  81 mg Oral  Daily  . atorvastatin  80 mg Oral q1800  . bictegravir-emtricitabine-tenofovir AF  1 tablet Oral Daily  . carvedilol  25 mg Oral BID WC  . dapsone  100 mg Oral Daily  . enoxaparin (LOVENOX) injection  40 mg Subcutaneous Q24H  . gabapentin  100 mg Oral TID  . icosapent Ethyl  2 g Oral BID  . insulin aspart  0-5 Units Subcutaneous QHS  . insulin aspart  0-9 Units Subcutaneous TID WC  . insulin detemir  40 Units Subcutaneous Daily  . oxybutynin  5 mg Oral BID  . ticagrelor  90 mg Oral BID   Continuous Infusions: . sodium chloride 125 mL/hr at 04/28/20 0255     LOS: 3 days      Debbe Odea, MD [facility] Pager: [email or web address] 04/29/2020, 12:19 PM

## 2020-04-30 DIAGNOSIS — I639 Cerebral infarction, unspecified: Secondary | ICD-10-CM | POA: Diagnosis not present

## 2020-04-30 LAB — GLUCOSE, CAPILLARY
Glucose-Capillary: 176 mg/dL — ABNORMAL HIGH (ref 70–99)
Glucose-Capillary: 201 mg/dL — ABNORMAL HIGH (ref 70–99)
Glucose-Capillary: 199 mg/dL — ABNORMAL HIGH (ref 70–99)
Glucose-Capillary: 157 mg/dL — ABNORMAL HIGH (ref 70–99)

## 2020-04-30 NOTE — Progress Notes (Signed)
STROKE TEAM PROGRESS NOTE   INTERVAL HISTORY Patient is sitting up in bed.  Continues to have left hemiparesis but his slurred speech has improved.  Left leg seems to be still weak though left arm is improving though he still has significant left grip and hand weakness.  He did participate in the sleep smart study and underwent the NOX 3 monitor overnight but did not qualify as his AHI index was low. vital signs stable.  Neuro exam unchanged.    Vitals:   04/29/20 1942 04/29/20 2032 04/30/20 0044 04/30/20 0435  BP: 130/82 107/62 126/76 131/81  Pulse: 63 64 65 65  Resp: '20 16 18 16  '$ Temp: (!) 97.5 F (36.4 C) 98.4 F (36.9 C) 97.9 F (36.6 C) 98.3 F (36.8 C)  TempSrc: Oral Oral Oral Oral  SpO2: 100% 99% 100% 97%  Weight:      Height:       CBC:  Recent Labs  Lab 04/25/20 0126 04/25/20 0128 04/26/20 1228 04/27/20 0802 04/28/20 0451  WBC 7.0   < > 5.2 5.4 6.0  NEUTROABS 3.0  --  2.5  --   --   HGB 11.1*   < > 10.7* 10.8* 10.4*  HCT 36.9*   < > 34.3* 33.9* 32.5*  MCV 86.6   < > 84.5 85.2 85.3  PLT 272   < > 257 241 243   < > = values in this interval not displayed.   Basic Metabolic Panel:  Recent Labs  Lab 04/27/20 0802 04/28/20 0451  NA 133* 136  K 4.7 4.1  CL 103 105  CO2 20* 21*  GLUCOSE 278* 121*  BUN 38* 36*  CREATININE 2.73* 2.54*  CALCIUM 9.1 9.2  MG 2.0 2.1   Lab Results  Component Value Date   CHOL 280 (H) 04/25/2020   HDL 32 (L) 04/25/2020   LDLCALC 205 (H) 04/25/2020   TRIG 217 (H) 04/25/2020   CHOLHDL 8.8 04/25/2020    Lab Results  Component Value Date   HGBA1C >15.5 (H) 03/24/2020   Urine Drug Screen:  Recent Labs  Lab 04/25/20 0755  LABOPIA NONE DETECTED  COCAINSCRNUR NONE DETECTED  LABBENZ NONE DETECTED  AMPHETMU NONE DETECTED  THCU NONE DETECTED  LABBARB NONE DETECTED     Alcohol Level  Recent Labs  Lab 04/25/20 0358  ETH <10    IMAGING past 24 hours No results found.  PHYSICAL EXAM Obese middle-aged  African-American male not in distress. . Afebrile. Head is nontraumatic. Neck is supple without bruit.    Cardiac exam no murmur or gallop. Lungs are clear to auscultation. Distal pulses are well felt. Neurological Exam : Is awake alert moderately dysarthric speech. He is oriented to time place and person. .no aphasia. Follows commands well. Extraocular movements are full range without nystagmus.  Mild left lower facial weakness tongue is midline. Motor system exam shows dense left hemiplegia with  3/5 strength in proximal left upper extremity but significant weakness of left wrist grip and intrinsic hand muscles.  And lower extremity 2/5 strength with decreased tone.  Normal antigravity strength on the right.. Sensation intact bilaterally. Reflexes are symmetric. Plantars downgoing. Gait not tested.  NIH stroke scale 6.  Premorbid baseline modified Rankin 1   ASSESSMENT/PLAN Mr. BENTO GANTERT is a 55 y.o. male with history of HIV, HTN, CAD/MI s/p stent, DM 2 with vascular and neurologic complications presenting with left sided weakness and slurred speech. Possible metabolic and functional etiology.  Stroke:  Small R pontine infarct secondary to small vessel disease source Acute neurological worsening on 04/27/2020.  Repeat CT scan yesterday showed expected evolutionary changes in the right paramedian pontine infarct without any acute abnormality.  Code Stroke CT head No acute abnormality. ASPECTS 10.   Repeat HCT 04/27/20: Better defined area of decreased attenuation in the right half ofthe pons consistent with evolving ischemia.  No new focal abnormality is noted  MRI  Possible small R pontine infarct. Old R paracentral pontine lacune.   MRA head & neck   Atherosclerosis mild mid BA, mild to moderate distal R ICA siphon and mild R A2 stenoses  2D Echo w/ bubble ejection fraction 50 to 55%.  No wall motion abnormalities.  Mild left ventricular concentric hypertrophy LDL   205  HgbA1c >15.5  03/24/2020  VTE prophylaxis - Lovenox 40 mg sq daily   aspirin 81 mg daily and Brilinta (ticagrelor) 90 mg bid prior to admission for cardiac revascularization in 03/2020, now on aspirin 81 mg daily and Brilinta (ticagrelor) 90 mg bid. Continue DAPT at d/c.   Therapy recommendations: SNF   Disposition: SNF  Hypertension  Stable on the high side . Permissive hypertension (OK if < 220/120) but gradually normalize in 5-7 days . Long-term BP goal normotensive  Hyperlipidemia  Home meds:  Lipitor 80, resumed in hospital  LDL 199 03/21/2020, goal < 70  Continue statin at discharge  Diabetes type II Uncontrolled  HgbA1c >15.5 03/24/2020, goal < 7.0  CBGs Recent Labs    04/29/20 1643 04/29/20 2114 04/29/20 2139  GLUCAP 188* 158* 145*      SSI  Other Stroke Risk Factors  Hx ETOH use, alcohol level <10, advised to drink no more than 2 drink(s) a day  Hx drug use  Obesity, Body mass index is 38.25 kg/m., BMI >/= 30 associated with increased stroke risk, recommend weight loss, diet and exercise as appropriate   Coronary artery disease s/p MI w/ recent stent on aspirin and Brilinta, BB 03/2020  HIV on Biktarvy and dapsone.  R BKA  Other Active Problems, Findings, Recommendations and/or Plan  CKD stage IV w anemia of chronic dz  Hx chronic pain   Sleep Smart Study recommended    Hospital day #3 Continue ongoing therapies.  Patient did not qualify for the sleep smart study based on the overnight NOx 3 monitor.  Transfer to inpatient rehab when bed available over the next few days.  Greater than 50% time during this 25-minute visit was spent in counseling and coordination of care about his lacunar stroke and questions about stroke and sleep apnoea Stroke team will sign off.  Follow-up as an outpatient stroke clinic in 6 weeks.  Discussed with patient and Dr. Wynelle Cleveland To contact Stroke Continuity provider, please refer to [email or web address] After hours, contact General  Neurology

## 2020-04-30 NOTE — Progress Notes (Signed)
PROGRESS NOTE    Keith [name]   [id number]  DOB: 12/11/1965  DOA: 04/25/2020 PCP: [name], FNP   Brief Narrative:  Keith [name] is a 55 y.o.malewith PMH significant for HIV, DM2,CKD 4,CAD status post recent stenting last month for ST elevation MI with history of diabetes mellitus type 2, right BKA & chronicanemia.  He presented to the ED via EMS for complaints of left arm weakness that had started in the middle of the night.  He was further noted to have a left-sided facial droop and left leg weakness and slurred speech.  His blood glucose was in the 60s.    A code stroke was called and the patient was evaluated by neurology in the ED.  His neurological symptoms improved while he was in the ED.  However, he has been having episodic progression of his neurological symptoms during his hospital stay.  Initial MRI imaging on 1/18 revealed subtle acute small vessel ischemia in the Right Pons, with a nearby chronic right paracentral pontine lacune (see also #3).  Due to progression of symptoms on 1/20, a CT of the head without contrast was obtained which revealed the following: decreased attenuation in the right half of the pons consistent with evolving ischemia.   Subjective: No new complaints.     Assessment & Plan:   Principal Problem:   Acute cerebrovascular accident (CVA) due to ischemia/atherosclerotic disease -The patient has had a right pontine infarct with symptoms of waxing and waning slurred speech, left facial droop, left arm and left leg weakness -His MRA reveals atherosclerotic disease and stenosis in multiple vessels - Appreciate care from stroke team- recommendations are to continue aspirin 81 mg and Brilinta 90 mg twice daily which were prescribed after his MI/stenting -He has significant risk factors for stroke including uncontrolled diabetes and hyperlipidemia - Cholesterol is 280, LDL 205 and HDL 32 -Hemoglobin A1c on 12/17 was > 15.5 - -last  2D echo revealed an EF of 55 to 60% and grade 1 diastolic dysfunction-no suspicion for embolic infarct -Physical therapy recommends skilled nursing facility or CIR- CIR states he is not a candidate as he does not have a person (other than his frail mother) to help him at home  Active Problems:   Essential hypertension -Continue carvedilol -hold amlodipine  Coronary artery disease status post stenting-peripheral artery disease-hyperlipidemia - He has been on aspirin and Brilinta since 12/21 when he was admitted with an acute MI which we are continuing - Continue statin    Insulin-requiring or dependent type II diabetes mellitus uncontrolled with hyperglycemia - Continue to aggressively treat diabetes to bring A1c down Continue long-acting insulin with sliding scale insulin while in the hospital    HIV disease ([place]) -Continue Biktarvy and dapsone    CKD (chronic kidney disease) stage 4, GFR 15-29 ml/min -His creatinine is at baseline which appears to range from 2.5-2.9    Hx of BKA, right   Normocytic anemia - Likely anemia of chronic disease    Time spent in minutes: 35 DVT prophylaxis: enoxaparin (LOVENOX) injection 40 mg Start: 04/25/20 1000  Code Status: Full code Family Communication:  Disposition Plan:  Status is: Inpatient  Remains inpatient appropriate because:Inpatient level of care appropriate due to severity of illness Awaiting SNF placement  Dispo: The patient is from: Home              Anticipated d/c is to: SNF  Anticipated d/c date is: > 3 days              Patient currently is medically stable to d/c.  Consultants:   Neurology Procedures:   2D echo Antimicrobials:  Anti-infectives (From admission, onward)   Start     Dose/Rate Route Frequency Ordered Stop   04/25/20 1000  bictegravir-emtricitabine-tenofovir AF (BIKTARVY) 50-200-25 MG per tablet 1 tablet        1 tablet Oral Daily 04/25/20 0456     04/25/20 1000  dapsone tablet 100 mg         100 mg Oral Daily 04/25/20 0456         Objective: Vitals:   04/29/20 2032 04/30/20 0044 04/30/20 0435 04/30/20 0858  BP: 107/62 126/76 131/81 134/76  Pulse: 64 65 65 63  Resp: '16 18 16 16  '$ Temp: 98.4 F (36.9 C) 97.9 F (36.6 C) 98.3 F (36.8 C) 98.1 F (36.7 C)  TempSrc: Oral Oral Oral Oral  SpO2: 99% 100% 97% 100%  Weight:      Height:        Intake/Output Summary (Last 24 hours) at 04/30/2020 1052 Last data filed at 04/30/2020 0857 Gross per 24 hour  Intake 540 ml  Output 2200 ml  Net -1660 ml   Filed Weights   04/25/20 0132 04/25/20 0134 04/25/20 2353  Weight: 117.9 kg 117.9 kg 117.5 kg    Examination: General exam: Appears comfortable  HEENT: PERRLA, oral mucosa moist, no sclera icterus or thrush Respiratory system: Clear to auscultation. Respiratory effort normal. Cardiovascular system: S1 & S2 heard, regular rate and rhythm Gastrointestinal system: Abdomen soft, non-tender, nondistended. Normal bowel sounds   Central nervous system: Alert and oriented. Left face arm and leg weakness persists Extremities: No cyanosis, clubbing or edema Skin: No rashes or ulcers Psychiatry:  Mood & affect appropriate.     Data Reviewed: I have personally reviewed following labs and imaging studies  CBC: Recent Labs  Lab 04/25/20 0126 04/25/20 0128 04/25/20 0619 04/26/20 1228 04/27/20 0802 04/28/20 0451  WBC 7.0  --  7.5 5.2 5.4 6.0  NEUTROABS 3.0  --   --  2.5  --   --   HGB 11.1* 11.9* 10.6* 10.7* 10.8* 10.4*  HCT 36.9* 35.0* 34.9* 34.3* 33.9* 32.5*  MCV 86.6  --  86.8 84.5 85.2 85.3  PLT 272  --  246 257 241 0000000   Basic Metabolic Panel: Recent Labs  Lab 04/25/20 0126 04/25/20 0128 04/25/20 0619 04/26/20 1228 04/27/20 0802 04/28/20 0451  NA 136 140  --  134* 133* 136  K 4.0 4.1  --  4.1 4.7 4.1  CL 103 105  --  103 103 105  CO2 22  --   --  23 20* 21*  GLUCOSE 66* 65*  --  238* 278* 121*  BUN 30* 32*  --  32* 38* 36*  CREATININE 2.80* 2.90*  2.65* 2.30* 2.73* 2.54*  CALCIUM 9.6  --   --  9.1 9.1 9.2  MG  --   --   --  2.0 2.0 2.1   GFR: Estimated Creatinine Clearance: 41.6 mL/min (A) (by C-G formula based on SCr of 2.54 mg/dL (H)). Liver Function Tests: Recent Labs  Lab 04/25/20 0126  AST 15  ALT 12  ALKPHOS 68  BILITOT 0.6  PROT 7.9  ALBUMIN 3.3*   No results for input(s): LIPASE, AMYLASE in the last 168 hours. No results for input(s): AMMONIA in the last 168 hours.  Coagulation Profile: Recent Labs  Lab 04/25/20 0126  INR 1.0   Cardiac Enzymes: No results for input(s): CKTOTAL, CKMB, CKMBINDEX, TROPONINI in the last 168 hours. BNP (last 3 results) No results for input(s): PROBNP in the last 8760 hours. HbA1C: No results for input(s): HGBA1C in the last 72 hours. CBG: Recent Labs  Lab 04/29/20 1643 04/29/20 2114 04/29/20 2139 04/30/20 0622 04/30/20 0623  GLUCAP 188* 158* 145* <10* 176*   Lipid Profile: No results for input(s): CHOL, HDL, LDLCALC, TRIG, CHOLHDL, LDLDIRECT in the last 72 hours. Thyroid Function Tests: No results for input(s): TSH, T4TOTAL, FREET4, T3FREE, THYROIDAB in the last 72 hours. Anemia Panel: No results for input(s): VITAMINB12, FOLATE, FERRITIN, TIBC, IRON, RETICCTPCT in the last 72 hours. Urine analysis:    Component Value Date/Time   COLORURINE YELLOW 04/25/2020 0755   APPEARANCEUR HAZY (A) 04/25/2020 0755   LABSPEC 1.014 04/25/2020 0755   PHURINE 5.0 04/25/2020 0755   GLUCOSEU >=500 (A) 04/25/2020 0755   HGBUR SMALL (A) 04/25/2020 0755   BILIRUBINUR NEGATIVE 04/25/2020 0755   BILIRUBINUR negative 09/07/2018 1034   KETONESUR NEGATIVE 04/25/2020 0755   PROTEINUR >=300 (A) 04/25/2020 0755   UROBILINOGEN 1.0 09/07/2018 1034   UROBILINOGEN 1.0 01/01/2017 1032   NITRITE NEGATIVE 04/25/2020 0755   LEUKOCYTESUR NEGATIVE 04/25/2020 0755   Sepsis Labs: '@LABRCNTIP'$ (procalcitonin:4,lacticidven:4) ) Recent Results (from the past 240 hour(s))  Resp Panel by RT-PCR (Flu A&B,  Covid) Nasopharyngeal Swab     Status: None   Collection Time: 04/25/20  4:35 AM   Specimen: Nasopharyngeal Swab; Nasopharyngeal(NP) swabs in vial transport medium  Result Value Ref Range Status   SARS Coronavirus 2 by RT PCR NEGATIVE NEGATIVE Final    Comment: (NOTE) SARS-CoV-2 target nucleic acids are NOT DETECTED.  The SARS-CoV-2 RNA is generally detectable in upper respiratory specimens during the acute phase of infection. The lowest concentration of SARS-CoV-2 viral copies this assay can detect is 138 copies/mL. A negative result does not preclude SARS-Cov-2 infection and should not be used as the sole basis for treatment or other patient management decisions. A negative result may occur with  improper specimen collection/handling, submission of specimen other than nasopharyngeal swab, presence of viral mutation(s) within the areas targeted by this assay, and inadequate number of viral copies(<138 copies/mL). A negative result must be combined with clinical observations, patient history, and epidemiological information. The expected result is Negative.  Fact Sheet for Patients:  EntrepreneurPulse.com.au  Fact Sheet for Healthcare Providers:  IncredibleEmployment.be  This test is no t yet approved or cleared by the [place] FDA and  has been authorized for detection and/or diagnosis of SARS-CoV-2 by FDA under an Emergency Use Authorization (EUA). This EUA will remain  in effect (meaning this test can be used) for the duration of the COVID-19 declaration under Section 564(b)(1) of the Act, 21 U.S.C.section 360bbb-3(b)(1), unless the authorization is terminated  or revoked sooner.       Influenza A by PCR NEGATIVE NEGATIVE Final   Influenza B by PCR NEGATIVE NEGATIVE Final    Comment: (NOTE) The Xpert Xpress SARS-CoV-2/FLU/RSV plus assay is intended as an aid in the diagnosis of influenza from Nasopharyngeal swab specimens and should  not be used as a sole basis for treatment. Nasal washings and aspirates are unacceptable for Xpert Xpress SARS-CoV-2/FLU/RSV testing.  Fact Sheet for Patients: EntrepreneurPulse.com.au  Fact Sheet for Healthcare Providers: IncredibleEmployment.be  This test is not yet approved or cleared by the [place] and has been authorized for  detection and/or diagnosis of SARS-CoV-2 by FDA under an Emergency Use Authorization (EUA). This EUA will remain in effect (meaning this test can be used) for the duration of the COVID-19 declaration under Section 564(b)(1) of the Act, 21 U.S.C. section 360bbb-3(b)(1), unless the authorization is terminated or revoked.  Performed at [facility], [place] [address] [place], [place] [address]          Radiology Studies: No results found.    Scheduled Meds: .  stroke: mapping our early stages of recovery book   Does not apply Once  . aspirin EC  81 mg Oral Daily  . atorvastatin  80 mg Oral q1800  . bictegravir-emtricitabine-tenofovir AF  1 tablet Oral Daily  . carvedilol  25 mg Oral BID WC  . dapsone  100 mg Oral Daily  . enoxaparin (LOVENOX) injection  40 mg Subcutaneous Q24H  . gabapentin  100 mg Oral TID  . icosapent Ethyl  2 g Oral BID  . insulin aspart  0-5 Units Subcutaneous QHS  . insulin aspart  0-9 Units Subcutaneous TID WC  . insulin detemir  40 Units Subcutaneous Daily  . oxybutynin  5 mg Oral BID  . ticagrelor  90 mg Oral BID   Continuous Infusions:    LOS: 4 days      Debbe Odea, MD [facility] Pager: [email or web address] 04/30/2020, 10:52 AM

## 2020-04-30 NOTE — Plan of Care (Signed)
  Problem: Self-Care: Goal: Ability to participate in self-care as condition permits will improve Outcome: Progressing Goal: Verbalization of feelings and concerns over difficulty with self-care will improve Outcome: Progressing Goal: Ability to communicate needs accurately will improve Outcome: Progressing

## 2020-05-01 DIAGNOSIS — I639 Cerebral infarction, unspecified: Secondary | ICD-10-CM | POA: Diagnosis not present

## 2020-05-01 LAB — CBC
HCT: 34.5 % — ABNORMAL LOW (ref 39.0–52.0)
Hemoglobin: 10.5 g/dL — ABNORMAL LOW (ref 13.0–17.0)
MCH: 26.1 pg (ref 26.0–34.0)
MCHC: 30.4 g/dL (ref 30.0–36.0)
MCV: 85.8 fL (ref 80.0–100.0)
Platelets: 256 10*3/uL (ref 150–400)
RBC: 4.02 MIL/uL — ABNORMAL LOW (ref 4.22–5.81)
RDW: 14.7 % (ref 11.5–15.5)
WBC: 6.1 10*3/uL (ref 4.0–10.5)
nRBC: 0 % (ref 0.0–0.2)

## 2020-05-01 LAB — BASIC METABOLIC PANEL
Anion gap: 9 (ref 5–15)
BUN: 33 mg/dL — ABNORMAL HIGH (ref 6–20)
CO2: 22 mmol/L (ref 22–32)
Calcium: 9.4 mg/dL (ref 8.9–10.3)
Chloride: 107 mmol/L (ref 98–111)
Creatinine, Ser: 2.26 mg/dL — ABNORMAL HIGH (ref 0.61–1.24)
GFR, Estimated: 33 mL/min — ABNORMAL LOW (ref >60)
Glucose, Bld: 192 mg/dL — ABNORMAL HIGH (ref 70–99)
Potassium: 4.5 mmol/L (ref 3.5–5.1)
Sodium: 138 mmol/L (ref 135–145)

## 2020-05-01 LAB — GLUCOSE, CAPILLARY
Glucose-Capillary: <10 mg/dL — CL (ref 70–99)
Glucose-Capillary: 199 mg/dL — ABNORMAL HIGH (ref 70–99)
Glucose-Capillary: 154 mg/dL — ABNORMAL HIGH (ref 70–99)
Glucose-Capillary: 137 mg/dL — ABNORMAL HIGH (ref 70–99)

## 2020-05-01 NOTE — Progress Notes (Signed)
Physical Therapy Treatment Patient Details Name: Keith [name] MRN: [id number] DOB: 1965/04/16 Today's Date: 05/01/2020    History of Present Illness Pt presents with L sided weakness UE and LE, facial droop, and slurred speech.  CT neg. MRI suspicious for CVA R pons. PMH - DM, CAD, HIV, rt BKA, CKD, back pain, HTN, diabetic neuropathy, STEMI s/p stent one mo ago.    PT Comments    Pt continues to demonstrate increased deficits in muscle activation on LLE. Pt agreeable and motivated to participate in therapy. Pt with improved UE motion compared to previous day, per patient. Pt educated on scooting up in the bed with increase use of LLE versus compensation of only using R side, pt verbalized understanding and participated in activity. Pt with continued improved hip extension with continued reps of bridges while in the bed. Pt would benefit from CIR, but has been denied due to d/c issues. If d/c issues are resolved pt would benefit from and can tolerate 3 hours of therapy with inpatient rehab. Pt continues to demonstrate deficits in balance, strength, coordination, endurance, muscular activation and safety and will benefit from skilled PT to address to maximize independence with functional mobility prior to discharge.    Follow Up Recommendations  SNF;Supervision/Assistance - 24 hourl; CIR if d/c plan can change and family can assist     Equipment Recommendations  Rolling walker with 5" wheels    Recommendations for Other Services       Precautions / Restrictions Precautions Precautions: Fall Precaution Comments: hx of R BKA Required Braces or Orthoses: Other Brace Other Brace: R prosthesis in room Restrictions Weight Bearing Restrictions: No    Mobility  Bed Mobility Overal bed mobility: Needs Assistance Bed Mobility: Supine to Sit;Sit to Supine     Supine to sit: HOB elevated;Min assist Sit to supine: Min assist   General bed mobility comments: Pt cued to bring legs off R EOB  and to utilize bed rail to come to sit, requiring min A for RLE  Transfers Overall transfer level: Needs assistance   Transfers: Squat Pivot Transfers     Squat pivot transfers: Max assist;From elevated surface;Mod assist     General transfer comment: performed squat pivot to scoot up in bed from EOB, pt requiring mod-max A, initially max a with increased trials mod A  Ambulation/Gait                 Stairs             Wheelchair Mobility    Modified Rankin (Stroke Patients Only) Modified Rankin (Stroke Patients Only) Pre-Morbid Rankin Score: No symptoms Modified Rankin: Severe disability     Arboriculturist Exercises - Lower Extremity Long Arc Quad: AAROM;Left;20 reps;Seated Other Exercises Other Exercises: bridges in bed with therapist advancing RLE into extension to increase use of LLE. pt performed 15 reps with improved hip extension noted wtih continued trials. therapist assisting with stabillity at knee on LLE  General Comments General comments (skin integrity, edema, etc.): VSS      Pertinent Vitals/Pain Pain Score: 2  Pain Location: R residual limb, phantom pain    Home Living                      Prior Function            PT Goals (current goals can now be found in the care plan section) Acute Rehab PT Goals Patient Stated Goal: to improve PT Goal Formulation: With patient Time For Goal Achievement: 05/09/20 Potential to Achieve Goals: Good Progress towards PT goals: Progressing toward goals    Frequency    Min 4X/week      PT Plan Discharge plan needs to be updated    Co-evaluation              AM-PAC PT "6 Clicks" Mobility   Outcome Measure  Help needed turning from your back to your side while in a flat bed without using bedrails?: A Little Help needed  moving from lying on your back to sitting on the side of a flat bed without using bedrails?: A Little Help needed moving to and from a bed to a chair (including a wheelchair)?: A Lot Help needed standing up from a chair using your arms (e.g., wheelchair or bedside chair)?: A Lot Help needed to walk in hospital room?: Total Help needed climbing 3-5 steps with a railing? : Total 6 Click Score: 12    End of Session   Activity Tolerance: Patient tolerated treatment well Patient left: with call bell/phone within reach;in bed;with bed alarm set Nurse Communication: Mobility status;Need for lift equipment PT Visit Diagnosis: Unsteadiness on feet (R26.81);Hemiplegia and hemiparesis;Difficulty in walking, not elsewhere classified (R26.2);Other abnormalities of gait and mobility (R26.89);Muscle weakness (generalized) (M62.81);Other symptoms and signs involving the nervous system (R29.898) Hemiplegia - Right/Left: Left Hemiplegia - dominant/non-dominant: Non-dominant Hemiplegia - caused by: Cerebral infarction     Time: [id number] PT Time Calculation (min) (ACUTE ONLY): 14 min  Charges:  $Neuromuscular Re-education: 8-22 mins                     Lyanne Co, DPT Acute Rehabilitation Services [id number]   [place] 05/01/2020, 2:34 PM

## 2020-05-01 NOTE — Progress Notes (Signed)
Keith [name]   [id number]  DOB: [birth year]  DOA: 04/25/2020 PCP: [name], FNP   Brief Narrative:  Keith [name] is a 55 y.o.malewith PMH significant for HIV, DM2,CKD 4,CAD status post recent stenting last month for ST elevation MI with history of diabetes mellitus type 2, right BKA & chronicanemia.  He presented to the ED via EMS for complaints of left arm weakness that had started in the middle of the night.  He was further noted to have a left-sided facial droop and left leg weakness and slurred speech.  His blood glucose was in the 60s.    A code stroke was called and the patient was evaluated by neurology in the ED.  His neurological symptoms improved while he was in the ED.  However, he has been having episodic progression of his neurological symptoms during his hospital stay.  Initial MRI imaging on 1/18 revealed subtle acute small vessel ischemia in the Right Pons, with a nearby chronic right paracentral pontine lacune (see also #3).  Due to progression of symptoms on 1/20, a CT of the head without contrast was obtained which revealed the following: decreased attenuation in the right half of the pons consistent with evolving ischemia.   Subjective: He has no new complaints today.    Assessment & Plan:   Principal Problem:   Acute cerebrovascular accident (CVA) due to ischemia/atherosclerotic disease -The patient has had a right pontine infarct with symptoms of waxing and waning slurred speech, left facial droop, left arm and left leg weakness -His MRA reveals atherosclerotic disease and stenosis in multiple vessels - Appreciate care from stroke team- recommendations are to continue aspirin 81 mg and Brilinta 90 mg twice daily which were prescribed after his MI/stenting -He has significant risk factors for stroke including uncontrolled diabetes and hyperlipidemia - Cholesterol is 280, LDL 205 and HDL 32 -Hemoglobin A1c on 12/17 was >  15.5 - -last 2D echo revealed an EF of 55 to 60% and grade 1 diastolic dysfunction-no suspicion for embolic infarct -Physical therapy recommends skilled nursing facility or CIR- CIR states he is not a candidate as he does not have a person (other than his frail mother) to help him at home  Active Problems:   Essential hypertension -Continue carvedilol -hold amlodipine  Coronary artery disease status post stenting-peripheral artery disease-hyperlipidemia - He has been on aspirin and Brilinta since 12/21 when he was admitted with an acute MI which we are continuing - Continue statin    Insulin-requiring or dependent type II diabetes mellitus uncontrolled with hyperglycemia - Continue to aggressively treat diabetes to bring A1c down Continue long-acting insulin with sliding scale insulin while in the hospital    HIV disease ([place]) -Continue Biktarvy and dapsone    CKD (chronic kidney disease) stage 4, GFR 15-29 ml/min -His creatinine is at baseline which appears to range from 2.2-2.9    Hx of BKA, right   Normocytic anemia - Likely anemia of chronic disease    Time spent in minutes: 35 DVT prophylaxis: enoxaparin (LOVENOX) injection 40 mg Start: 04/25/20 1000  Code Status: Full code Family Communication:  Disposition Plan:  Status is: Inpatient  Remains inpatient appropriate because:Inpatient level of care appropriate due to severity of illness Awaiting SNF placement  Dispo: The patient is from: Home              Anticipated d/c is to: SNF  Anticipated d/c date is: > 3 days              Patient currently is medically stable to d/c.  Consultants:   Neurology Procedures:   2D echo Antimicrobials:  Anti-infectives (From admission, onward)   Start     Dose/Rate Route Frequency Ordered Stop   04/25/20 1000  bictegravir-emtricitabine-tenofovir AF (BIKTARVY) 50-200-25 MG per tablet 1 tablet        1 tablet Oral Daily 04/25/20 0456     04/25/20 1000  dapsone  tablet 100 mg        100 mg Oral Daily 04/25/20 0456         Objective: Vitals:   04/30/20 1531 04/30/20 2100 04/30/20 2316 05/01/20 0343  BP: 124/87 113/72 132/75 118/69  Pulse: 64 70 67 62  Resp: '18 20 17 18  '$ Temp: 97.8 F (36.6 C) 97.9 F (36.6 C) 97.9 F (36.6 C) 98 F (36.7 C)  TempSrc: Oral Oral Oral Oral  SpO2: 100% 100% 100% 100%  Weight:      Height:        Intake/Output Summary (Last 24 hours) at 05/01/2020 1643 Last data filed at 05/01/2020 0947 Gross per 24 hour  Intake -  Output 550 ml  Net -550 ml   Filed Weights   04/25/20 0132 04/25/20 0134 04/25/20 2353  Weight: 117.9 kg 117.9 kg 117.5 kg    Examination: General exam: Appears comfortable  HEENT: PERRLA, oral mucosa moist, no sclera icterus or thrush Respiratory system: Clear to auscultation. Respiratory effort normal. Cardiovascular system: S1 & S2 heard, regular rate and rhythm Gastrointestinal system: Abdomen soft, non-tender, nondistended. Normal bowel sounds   Central nervous system: Alert and oriented. Weakness of left face, arm and leg noted Extremities: No cyanosis, clubbing or edema Skin: No rashes or ulcers Psychiatry:  Mood & affect appropriate.     Data Reviewed: I have personally reviewed following labs and imaging studies  CBC: Recent Labs  Lab 04/25/20 0126 04/25/20 0128 04/25/20 0619 04/26/20 1228 04/27/20 0802 04/28/20 0451 05/01/20 0319  WBC 7.0  --  7.5 5.2 5.4 6.0 6.1  NEUTROABS 3.0  --   --  2.5  --   --   --   HGB 11.1*   < > 10.6* 10.7* 10.8* 10.4* 10.5*  HCT 36.9*   < > 34.9* 34.3* 33.9* 32.5* 34.5*  MCV 86.6  --  86.8 84.5 85.2 85.3 85.8  PLT 272  --  246 257 241 243 256   < > = values in this interval not displayed.   Basic Metabolic Panel: Recent Labs  Lab 04/25/20 0126 04/25/20 0128 04/25/20 0619 04/26/20 1228 04/27/20 0802 04/28/20 0451 05/01/20 0319  NA 136 140  --  134* 133* 136 138  K 4.0 4.1  --  4.1 4.7 4.1 4.5  CL 103 105  --  103 103 105  107  CO2 22  --   --  23 20* 21* 22  GLUCOSE 66* 65*  --  238* 278* 121* 192*  BUN 30* 32*  --  32* 38* 36* 33*  CREATININE 2.80* 2.90* 2.65* 2.30* 2.73* 2.54* 2.26*  CALCIUM 9.6  --   --  9.1 9.1 9.2 9.4  MG  --   --   --  2.0 2.0 2.1  --    GFR: Estimated Creatinine Clearance: 46.7 mL/min (A) (by C-G formula based on SCr of 2.26 mg/dL (H)). Liver Function Tests: Recent Labs  Lab 04/25/20 0126  AST 15  ALT 12  ALKPHOS 68  BILITOT 0.6  PROT 7.9  ALBUMIN 3.3*   No results for input(s): LIPASE, AMYLASE in the last 168 hours. No results for input(s): AMMONIA in the last 168 hours. Coagulation Profile: Recent Labs  Lab 04/25/20 0126  INR 1.0   Cardiac Enzymes: No results for input(s): CKTOTAL, CKMB, CKMBINDEX, TROPONINI in the last 168 hours. BNP (last 3 results) No results for input(s): PROBNP in the last 8760 hours. HbA1C: No results for input(s): HGBA1C in the last 72 hours. CBG: Recent Labs  Lab 04/30/20 0623 04/30/20 1141 04/30/20 1602 04/30/20 2236 05/01/20 0557  GLUCAP 176* 201* 199* 157* 199*   Lipid Profile: No results for input(s): CHOL, HDL, LDLCALC, TRIG, CHOLHDL, LDLDIRECT in the last 72 hours. Thyroid Function Tests: No results for input(s): TSH, T4TOTAL, FREET4, T3FREE, THYROIDAB in the last 72 hours. Anemia Panel: No results for input(s): VITAMINB12, FOLATE, FERRITIN, TIBC, IRON, RETICCTPCT in the last 72 hours. Urine analysis:    Component Value Date/Time   COLORURINE YELLOW 04/25/2020 0755   APPEARANCEUR HAZY (A) 04/25/2020 0755   LABSPEC 1.014 04/25/2020 0755   PHURINE 5.0 04/25/2020 0755   GLUCOSEU >=500 (A) 04/25/2020 0755   HGBUR SMALL (A) 04/25/2020 0755   BILIRUBINUR NEGATIVE 04/25/2020 0755   BILIRUBINUR negative 09/07/2018 1034   KETONESUR NEGATIVE 04/25/2020 0755   PROTEINUR >=300 (A) 04/25/2020 0755   UROBILINOGEN 1.0 09/07/2018 1034   UROBILINOGEN 1.0 01/01/2017 1032   NITRITE NEGATIVE 04/25/2020 0755   LEUKOCYTESUR NEGATIVE  04/25/2020 0755   Sepsis Labs: '@LABRCNTIP'$ (procalcitonin:4,lacticidven:4) ) Recent Results (from the past 240 hour(s))  Resp Panel by RT-PCR (Flu A&B, Covid) Nasopharyngeal Swab     Status: None   Collection Time: 04/25/20  4:35 AM   Specimen: Nasopharyngeal Swab; Nasopharyngeal(NP) swabs in vial transport medium  Result Value Ref Range Status   SARS Coronavirus 2 by RT PCR NEGATIVE NEGATIVE Final    Comment: (NOTE) SARS-CoV-2 target nucleic acids are NOT DETECTED.  The SARS-CoV-2 RNA is generally detectable in upper respiratory specimens during the acute phase of infection. The lowest concentration of SARS-CoV-2 viral copies this assay can detect is 138 copies/mL. A negative result does not preclude SARS-Cov-2 infection and should not be used as the sole basis for treatment or other patient management decisions. A negative result may occur with  improper specimen collection/handling, submission of specimen other than nasopharyngeal swab, presence of viral mutation(s) within the areas targeted by this assay, and inadequate number of viral copies(<138 copies/mL). A negative result must be combined with clinical observations, patient history, and epidemiological information. The expected result is Negative.  Fact Sheet for Patients:  EntrepreneurPulse.com.au  Fact Sheet for Healthcare Providers:  IncredibleEmployment.be  This test is no t yet approved or cleared by the [place] FDA and  has been authorized for detection and/or diagnosis of SARS-CoV-2 by FDA under an Emergency Use Authorization (EUA). This EUA will remain  in effect (meaning this test can be used) for the duration of the COVID-19 declaration under Section 564(b)(1) of the Act, 21 U.S.C.section 360bbb-3(b)(1), unless the authorization is terminated  or revoked sooner.       Influenza A by PCR NEGATIVE NEGATIVE Final   Influenza B by PCR NEGATIVE NEGATIVE Final     Comment: (NOTE) The Xpert Xpress SARS-CoV-2/FLU/RSV plus assay is intended as an aid in the diagnosis of influenza from Nasopharyngeal swab specimens and should not be used as a sole basis for treatment. Nasal washings and  aspirates are unacceptable for Xpert Xpress SARS-CoV-2/FLU/RSV testing.  Fact Sheet for Patients: EntrepreneurPulse.com.au  Fact Sheet for Healthcare Providers: IncredibleEmployment.be  This test is not yet approved or cleared by the [place] FDA and has been authorized for detection and/or diagnosis of SARS-CoV-2 by FDA under an Emergency Use Authorization (EUA). This EUA will remain in effect (meaning this test can be used) for the duration of the COVID-19 declaration under Section 564(b)(1) of the Act, 21 U.S.C. section 360bbb-3(b)(1), unless the authorization is terminated or revoked.  Performed at [facility], [place] [address] [place], [place] [address]          Radiology Studies: No results found.    Scheduled Meds: .  stroke: mapping our early stages of recovery book   Does not apply Once  . aspirin EC  81 mg Oral Daily  . atorvastatin  80 mg Oral q1800  . bictegravir-emtricitabine-tenofovir AF  1 tablet Oral Daily  . carvedilol  25 mg Oral BID WC  . dapsone  100 mg Oral Daily  . enoxaparin (LOVENOX) injection  40 mg Subcutaneous Q24H  . gabapentin  100 mg Oral TID  . icosapent Ethyl  2 g Oral BID  . insulin aspart  0-5 Units Subcutaneous QHS  . insulin aspart  0-9 Units Subcutaneous TID WC  . insulin detemir  40 Units Subcutaneous Daily  . oxybutynin  5 mg Oral BID  . ticagrelor  90 mg Oral BID   Continuous Infusions:    LOS: 5 days      Debbe Odea, MD [facility] Pager: [email or web address] 05/01/2020, 4:43 PM

## 2020-05-01 NOTE — Progress Notes (Signed)
Inpatient Rehabilitation-Admissions Coordinator   Following up for my coworker Clemens Catholic. Received notification that this patient's insurance has approved CIR. However, per Laura's note on 1/21, the patient does not have the recommended DC support to qualify for the CIR program here at [facility] and she has signed off. Spoke with [facility] team as they are pursing SNF. AC left voicemail with insurance company to cancel out current CIR approval to allow for pursuit of SNF auth.   Raechel Ache, OTR/L  Rehab Admissions Coordinator  [phone] 05/01/2020 11:54 AM

## 2020-05-01 NOTE — Plan of Care (Signed)
  Problem: Education: Goal: Ability to describe self-care measures that may prevent or decrease complications (Diabetes Survival Skills Education) will improve Outcome: Progressing Goal: Individualized Educational Video(s) Outcome: Progressing   Problem: Education: Goal: Knowledge of disease or condition will improve Outcome: Progressing Goal: Knowledge of secondary prevention will improve Outcome: Progressing Goal: Knowledge of patient specific risk factors addressed and post discharge goals established will improve Outcome: Progressing Goal: Individualized Educational Video(s) Outcome: Progressing   Problem: Intracerebral Hemorrhage Tissue Perfusion: Goal: Complications of Intracerebral Hemorrhage will be minimized Outcome: Progressing   Problem: Ischemic Stroke/TIA Tissue Perfusion: Goal: Complications of ischemic stroke/TIA will be minimized Outcome: Progressing

## 2020-05-02 DIAGNOSIS — I639 Cerebral infarction, unspecified: Secondary | ICD-10-CM | POA: Diagnosis not present

## 2020-05-02 LAB — GLUCOSE, CAPILLARY
Glucose-Capillary: 175 mg/dL — ABNORMAL HIGH (ref 70–99)
Glucose-Capillary: 157 mg/dL — ABNORMAL HIGH (ref 70–99)
Glucose-Capillary: 153 mg/dL — ABNORMAL HIGH (ref 70–99)
Glucose-Capillary: 145 mg/dL — ABNORMAL HIGH (ref 70–99)

## 2020-05-02 LAB — SARS CORONAVIRUS 2 (TAT 6-24 HRS): SARS Coronavirus 2: NEGATIVE

## 2020-05-02 NOTE — Plan of Care (Signed)
  Problem: Education: Goal: Ability to describe self-care measures that may prevent or decrease complications (Diabetes Survival Skills Education) will improve Outcome: Progressing Goal: Individualized Educational Video(s) Outcome: Progressing   Problem: Ischemic Stroke/TIA Tissue Perfusion: Goal: Complications of ischemic stroke/TIA will be minimized Outcome: Progressing   Problem: Education: Goal: Knowledge of disease or condition will improve Outcome: Progressing Goal: Knowledge of secondary prevention will improve Outcome: Progressing Goal: Knowledge of patient specific risk factors addressed and post discharge goals established will improve Outcome: Progressing Goal: Individualized Educational Video(s) Outcome: Progressing   Problem: Nutritional: Goal: Maintenance of adequate nutrition will improve Outcome: Progressing Goal: Progress toward achieving an optimal weight will improve Outcome: Progressing

## 2020-05-02 NOTE — Progress Notes (Signed)
Physical Therapy Treatment Patient Details Name: Keith [name] MRN: [name] DOB: December 07, 1965 Today's Date: 05/02/2020    History of Present Illness Pt presents with L sided weakness UE and LE, facial droop, and slurred speech.  CT neg. MRI suspicious for CVA R pons. PMH - DM, CAD, HIV, rt BKA, CKD, back pain, HTN, diabetic neuropathy, STEMI s/p stent one mo ago.    PT Comments    Pt very motivated during session. Pt with improved muscle activation on LLE and improved balance. Pt able to perform sit<>Stand in steady multiple times with min A-min guard assist. Pt impulsive requiring cueing to slow down. Pt also demonstrating improve activation of LUE. Pt continues to have deficits in balance, strength, coordination, safety, gait and endurance and will benefit from skilled PT to address deficits to maximize independence with functional mobility prior to discharge.     Follow Up Recommendations  SNF;Supervision/Assistance - 24 hour     Equipment Recommendations  Other (comment);Wheelchair cushion (measurements PT);Wheelchair (measurements PT);Rolling walker with 5" wheels;3in1 (PT) (TBD)    Recommendations for Other Services       Precautions / Restrictions Precautions Precautions: Fall Precaution Comments: hx of R BKA Required Braces or Orthoses: Other Brace Other Brace: R prosthesis in room    Mobility  Bed Mobility Overal bed mobility: Needs Assistance       Supine to sit: HOB elevated;Min assist     General bed mobility comments: Pt cued to bring legs off R EOB and to utilize bed rail to come to sit, requiring min A for RLE  Transfers Overall transfer level: Needs assistance   Transfers: Sit to/from Stand Sit to Stand: Min assist;Min guard         General transfer comment: performed sit<>stand from EOB and recliner multiple times pull upon steady. performed with min A initially and progressing to min guard. performed multiple sit<>stand in steady wtih therapist  providing cueing to increase hip and trunk extension to improve upright posture, following cueing pt able to perform wtih min guard. increased latearl weight shift onto RLE  Ambulation/Gait                 Stairs             Wheelchair Mobility    Modified Rankin (Stroke Patients Only) Modified Rankin (Stroke Patients Only) Pre-Morbid Rankin Score: No symptoms Modified Rankin: Moderately severe disability     Balance Overall balance assessment: Needs assistance Sitting-balance support: Feet supported;Single extremity supported Sitting balance-Leahy Scale: Poor Sitting balance - Comments: Pt alternating R UE to no UE support, min guard for safety, mild trunk sway to L                                    Cognition Arousal/Alertness: Awake/alert Behavior During Therapy: WFL for tasks assessed/performed Overall Cognitive Status: Impaired/Different from baseline Area of Impairment: Attention;Memory;Following commands;Safety/judgement;Awareness;Problem solving                         Safety/Judgement: Decreased awareness of safety;Decreased awareness of deficits     General Comments: poor awareness of deficits, slightly impulsive      Exercises      General Comments        Pertinent Vitals/Pain Pain Assessment: No/denies pain    Home Living  Prior Function            PT Goals (current goals can now be found in the care plan section) Acute Rehab PT Goals Patient Stated Goal: to improve PT Goal Formulation: With patient Time For Goal Achievement: 05/09/20 Potential to Achieve Goals: Good Progress towards PT goals: Progressing toward goals    Frequency    Min 4X/week      PT Plan Current plan remains appropriate    Co-evaluation              AM-PAC PT "6 Clicks" Mobility   Outcome Measure  Help needed turning from your back to your side while in a flat bed without using bedrails?:  A Little Help needed moving from lying on your back to sitting on the side of a flat bed without using bedrails?: A Little Help needed moving to and from a bed to a chair (including a wheelchair)?: A Lot Help needed standing up from a chair using your arms (e.g., wheelchair or bedside chair)?: A Lot Help needed to walk in hospital room?: Total Help needed climbing 3-5 steps with a railing? : Total 6 Click Score: 12    End of Session Equipment Utilized During Treatment: Gait belt Activity Tolerance: Patient tolerated treatment well Patient left: in chair;with call bell/phone within reach;with chair alarm set Nurse Communication: Mobility status;Need for lift equipment PT Visit Diagnosis: Unsteadiness on feet (R26.81);Hemiplegia and hemiparesis;Difficulty in walking, not elsewhere classified (R26.2);Other abnormalities of gait and mobility (R26.89);Muscle weakness (generalized) (M62.81);Other symptoms and signs involving the nervous system (R29.898) Hemiplegia - Right/Left: Left Hemiplegia - dominant/non-dominant: Non-dominant Hemiplegia - caused by: Cerebral infarction     Time: 1104-1130 PT Time Calculation (min) (ACUTE ONLY): 26 min  Charges:  $Neuromuscular Re-education: 23-37 mins                     Lyanne Co, DPT Acute Rehabilitation Services [id number]   [place] 05/02/2020, 12:06 PM

## 2020-05-02 NOTE — Progress Notes (Signed)
Occupational Therapy Treatment Patient Details Name: Keith [name] MRN: [name] DOB: March 21, 1966 Today's Date: 05/02/2020    History of present illness 55 yo male presenting with left sided weakness and left facial droop. MRI on 1/18 showing subtle acute small vessel ischemia in the Right pons. Progression of symptoms on 1/20 and CT of head revealed decreased attenuation of R hald of pan consistent with evolving eschemia. PMH including HIV, DM2, CKD 4, CAD status post recent stenting last month for ST elevation MI with history of diabetes mellitus type 2, and right BKA & chronic anemia.   OT comments  Upon arrival, pt sitting in recliner eating lunch. Pt progressing towards established OT goals. Pt motivated to participate in therapy. Continues to present with decreased cognition, functional use of LUE, strength, and balance. Pt presenting with impulsivity, poor attention, and decreased awareness. Requiring simple cues and calm environment. Pt performing AROM exercises of LUE, simulated cleaning task, and target grasp and release task; requiring Min-Mod A for control and normalizing movement patterns. Continue to recommend dc to post-acute rehab and will continue to follow acutely as admitted.    Follow Up Recommendations  SNF    Equipment Recommendations  Other (comment) (TBD)    Recommendations for Other Services PT consult    Precautions / Restrictions Precautions Precautions: Fall Precaution Comments: hx of R BKA Required Braces or Orthoses: Other Brace Other Brace: R prosthesis in room       Mobility Bed Mobility               General bed mobility comments: Pt sitting at recliner upon arrival  Transfers                      Balance Overall balance assessment: Needs assistance Sitting-balance support: Feet supported;Single extremity supported Sitting balance-Leahy Scale: Fair                                     ADL either performed or  assessed with clinical judgement   ADL Overall ADL's : Needs assistance/impaired                                       General ADL Comments: Focused session on exercises and theraputic activities for LUE. Simulated cleaning activity with pt wiping down table using wash clothe. Cues for sequencing and hand positioning. Min-Mod A for support at shoulder and elbow     Vision       Perception     Praxis      Cognition Arousal/Alertness: Awake/alert Behavior During Therapy: [facility] for tasks assessed/performed;Impulsive Overall Cognitive Status: Impaired/Different from baseline Area of Impairment: Attention;Memory;Following commands;Safety/judgement;Awareness;Problem solving                   Current Attention Level: Sustained (easily distracted by environment) Memory: Decreased short-term memory;Decreased recall of precautions Following Commands: Follows one step commands inconsistently;Follows one step commands with increased time Safety/Judgement: Decreased awareness of safety;Decreased awareness of deficits Awareness: Emergent Problem Solving: Slow processing;Requires verbal cues;Difficulty sequencing;Requires tactile cues General Comments: Pt continues to present with impulsive behavior and requiring cues for slowing down and take his time. Poor awareness of deficits. Benefits fro mcalm environment with simple cues.        Exercises Exercises: General Upper Extremity;Other exercises;Hand exercises General Exercises -  Upper Extremity Elbow Flexion: AROM;Left;10 reps;Seated;Other (comment) (Min-Mod A for normalizing movement and reducing shoulder hiking) Elbow Extension: AROM;Left;10 reps;Seated (Min-Mod A for normalizing movement and reducing shoulder hiking) Wrist Flexion: AROM;Left;10 reps;Seated (Min-Mod A for normalizing movement and reducing shoulder hiking) Wrist Extension: AROM;Left;10 reps;Seated (Min-Mod A for normalizing movement and reducing  shoulder hiking) Digit Composite Flexion: AROM;Left;10 reps;Seated Composite Extension: AROM;Left;10 reps;Seated Hand Exercises Forearm Supination: AROM;Left;10 reps;Seated (Min-Mod A for normalizing movement and reducing shoulder hiking) Forearm Pronation: AROM;Left;10 reps;Seated (Min-Mod A for normalizing movement and reducing shoulder hiking) Other Exercises Other Exercises: Pt performing functional grasp and release task. Targeted reach and graspf of 12 objects (varying sizes) and then place in cup. Min-Mod A for normalizing movement patterns and reducing shoulder hiking; requiring increased support as he fatigued. x2 sets of task.   Shoulder Instructions       General Comments      Pertinent Vitals/ Pain       Pain Assessment: Faces Faces Pain Scale: Hurts a little bit Pain Location: L arm with increased activity Pain Descriptors / Indicators: Discomfort;Grimacing Pain Intervention(s): Monitored during session;Limited activity within patient's tolerance;Repositioned  Home Living                                          Prior Functioning/Environment              Frequency  Min 2X/week        Progress Toward Goals  OT Goals(current goals can now be found in the care plan section)  Progress towards OT goals: Progressing toward goals  Acute Rehab OT Goals Patient Stated Goal: to improve OT Goal Formulation: With patient Time For Goal Achievement: 05/09/20 Potential to Achieve Goals: Good ADL Goals Pt Will Perform Upper Body Dressing: with modified independence;sitting Pt Will Perform Lower Body Dressing: with min guard assist;sit to/from stand Pt Will Transfer to Toilet: with min guard assist;bedside commode;ambulating Pt Will Perform Toileting - Clothing Manipulation and hygiene: with min guard assist;sitting/lateral leans;sit to/from stand Additional ADL Goal #1: Pt will demonstrate increased safety awareness to perform ADL with Min cues for  safety  Plan Frequency remains appropriate;Discharge plan needs to be updated    Co-evaluation                 AM-PAC OT "6 Clicks" Daily Activity     Outcome Measure   Help from another person eating meals?: A Little Help from another person taking care of personal grooming?: A Lot Help from another person toileting, which includes using toliet, bedpan, or urinal?: Total Help from another person bathing (including washing, rinsing, drying)?: Total Help from another person to put on and taking off regular upper body clothing?: A Lot Help from another person to put on and taking off regular lower body clothing?: Total 6 Click Score: 10    End of Session    OT Visit Diagnosis: Other abnormalities of gait and mobility (R26.89);Unsteadiness on feet (R26.81);Muscle weakness (generalized) (M62.81);Pain Pain - Right/Left: Right Pain - part of body: Leg   Activity Tolerance Patient tolerated treatment well   Patient Left in chair;with call bell/phone within reach;with chair alarm set   Nurse Communication Mobility status        Time: [id number] OT Time Calculation (min): 27 min  Charges: OT General Charges $OT Visit: 1 Visit OT Treatments $Therapeutic Activity: 8-22 mins $Neuromuscular Re-education: 8-22 mins  [place], OTR/L Acute Rehab Pager: [phone] Office: [place] 05/02/2020, 4:10 PM

## 2020-05-02 NOTE — Progress Notes (Signed)
PROGRESS NOTE    BIRAN OCHOCKI   [id number]  DOB: [birth year]  DOA: 04/25/2020 PCP: [name], FNP   Brief Narrative:  Keith [name] is a 55 y.o.malewith PMH significant for HIV, DM2,CKD 4,CAD status post recent stenting last month for ST elevation MI with history of diabetes mellitus type 2, right BKA & chronicanemia.  He presented to the ED via EMS for complaints of left arm weakness that had started in the middle of the night.  He was further noted to have a left-sided facial droop and left leg weakness and slurred speech.  His blood glucose was in the 60s.    A code stroke was called and the patient was evaluated by neurology in the ED.  His neurological symptoms improved while he was in the ED.  However, he has been having episodic progression of his neurological symptoms during his hospital stay.  Initial MRI imaging on 1/18 revealed subtle acute small vessel ischemia in the Right Pons, with a nearby chronic right paracentral pontine lacune (see also #3).  Due to progression of symptoms on 1/20, a CT of the head without contrast was obtained which revealed the following: decreased attenuation in the right half of the pons consistent with evolving ischemia.   Subjective: No complaints.     Assessment & Plan:   Principal Problem:   Acute cerebrovascular accident (CVA) due to ischemia/atherosclerotic disease -The patient has had a right pontine infarct with symptoms of waxing and waning slurred speech, left facial droop, left arm and left leg weakness -His MRA reveals atherosclerotic disease and stenosis in multiple vessels - Appreciate care from stroke team- recommendations are to continue aspirin 81 mg and Brilinta 90 mg twice daily which were prescribed after his MI/stenting -He has significant risk factors for stroke including uncontrolled diabetes and hyperlipidemia - Cholesterol is 280, LDL 205 and HDL 32 -Hemoglobin A1c on 12/17 was > 15.5 - -last 2D  echo revealed an EF of 55 to 60% and grade 1 diastolic dysfunction-no suspicion for embolic infarct -Physical therapy recommends skilled nursing facility or CIR- CIR states he is not a candidate as he does not have a person (other than his frail mother) to help him at home  Active Problems:   Essential hypertension -Continue carvedilol -hold amlodipine  Coronary artery disease status post stenting-peripheral artery disease-hyperlipidemia - He has been on aspirin and Brilinta since 12/21 when he was admitted with an acute MI which we are continuing - Continue statin    Insulin-requiring or dependent type II diabetes mellitus uncontrolled with hyperglycemia - Continue to aggressively treat diabetes to bring A1c down Continue long-acting insulin with sliding scale insulin while in the hospital    HIV disease ([place]) -Continue Biktarvy and dapsone    CKD (chronic kidney disease) stage 4, GFR 15-29 ml/min -His creatinine is at baseline which appears to range from 2.2-2.9    Hx of BKA, right   Normocytic anemia - Likely anemia of chronic disease    Time spent in minutes: 35 DVT prophylaxis: enoxaparin (LOVENOX) injection 40 mg Start: 04/25/20 1000  Code Status: Full code Family Communication:  Disposition Plan:  Status is: Inpatient  Remains inpatient appropriate because:Inpatient level of care appropriate due to severity of illness Awaiting SNF placement  Dispo: The patient is from: Home              Anticipated d/c is to: SNF              Anticipated  d/c date is: > 3 days              Patient currently is medically stable to d/c.  Consultants:   Neurology Procedures:   2D echo Antimicrobials:  Anti-infectives (From admission, onward)   Start     Dose/Rate Route Frequency Ordered Stop   04/25/20 1000  bictegravir-emtricitabine-tenofovir AF (BIKTARVY) 50-200-25 MG per tablet 1 tablet        1 tablet Oral Daily 04/25/20 0456     04/25/20 1000  dapsone tablet 100 mg         100 mg Oral Daily 04/25/20 0456         Objective: Vitals:   05/01/20 1900 05/01/20 2329 05/02/20 0330 05/02/20 0859  BP: (!) 143/69 134/81 140/75 125/74  Pulse: 61 (!) 59 61 95  Resp: '16 13 20 18  '$ Temp: 98.1 F (36.7 C) 98.1 F (36.7 C) 98.4 F (36.9 C) 97.9 F (36.6 C)  TempSrc: Oral Oral Oral Oral  SpO2: 100% 100% 97% 90%  Weight:      Height:        Intake/Output Summary (Last 24 hours) at 05/02/2020 1233 Last data filed at 05/02/2020 0000 Gross per 24 hour  Intake -  Output 875 ml  Net -875 ml   Filed Weights   04/25/20 0132 04/25/20 0134 04/25/20 2353  Weight: 117.9 kg 117.9 kg 117.5 kg    Examination: General exam: Appears comfortable  HEENT: PERRLA, oral mucosa moist, no sclera icterus or thrush Respiratory system: Clear to auscultation. Respiratory effort normal. Cardiovascular system: S1 & S2 heard, regular rate and rhythm Gastrointestinal system: Abdomen soft, non-tender, nondistended. Normal bowel sounds   Central nervous system: Alert and oriented. He has unchanged weakness of left face, arm and leg noted Extremities: No cyanosis, clubbing or edema Skin: No rashes or ulcers Psychiatry:  Mood & affect appropriate.     Data Reviewed: I have personally reviewed following labs and imaging studies  CBC: Recent Labs  Lab 04/26/20 1228 04/27/20 0802 04/28/20 0451 05/01/20 0319  WBC 5.2 5.4 6.0 6.1  NEUTROABS 2.5  --   --   --   HGB 10.7* 10.8* 10.4* 10.5*  HCT 34.3* 33.9* 32.5* 34.5*  MCV 84.5 85.2 85.3 85.8  PLT 257 241 243 [id number]   Basic Metabolic Panel: Recent Labs  Lab 04/26/20 1228 04/27/20 0802 04/28/20 0451 05/01/20 0319  NA 134* 133* 136 138  K 4.1 4.7 4.1 4.5  CL 103 103 105 107  CO2 23 20* 21* 22  GLUCOSE 238* 278* 121* 192*  BUN 32* 38* 36* 33*  CREATININE 2.30* 2.73* 2.54* 2.26*  CALCIUM 9.1 9.1 9.2 9.4  MG 2.0 2.0 2.1  --    GFR: Estimated Creatinine Clearance: 46.7 mL/min (A) (by C-G formula based on SCr of 2.26 mg/dL  (H)). Liver Function Tests: No results for input(s): AST, ALT, ALKPHOS, BILITOT, PROT, ALBUMIN in the last 168 hours. No results for input(s): LIPASE, AMYLASE in the last 168 hours. No results for input(s): AMMONIA in the last 168 hours. Coagulation Profile: No results for input(s): INR, PROTIME in the last 168 hours. Cardiac Enzymes: No results for input(s): CKTOTAL, CKMB, CKMBINDEX, TROPONINI in the last 168 hours. BNP (last 3 results) No results for input(s): PROBNP in the last 8760 hours. HbA1C: No results for input(s): HGBA1C in the last 72 hours. CBG: Recent Labs  Lab 04/30/20 2236 05/01/20 0557 05/01/20 1753 05/01/20 2108 05/02/20 0653  GLUCAP 157* 199* 154* 137* 175*  Lipid Profile: No results for input(s): CHOL, HDL, LDLCALC, TRIG, CHOLHDL, LDLDIRECT in the last 72 hours. Thyroid Function Tests: No results for input(s): TSH, T4TOTAL, FREET4, T3FREE, THYROIDAB in the last 72 hours. Anemia Panel: No results for input(s): VITAMINB12, FOLATE, FERRITIN, TIBC, IRON, RETICCTPCT in the last 72 hours. Urine analysis:    Component Value Date/Time   COLORURINE YELLOW 04/25/2020 0755   APPEARANCEUR HAZY (A) 04/25/2020 0755   LABSPEC 1.014 04/25/2020 0755   PHURINE 5.0 04/25/2020 0755   GLUCOSEU >=500 (A) 04/25/2020 0755   HGBUR SMALL (A) 04/25/2020 0755   BILIRUBINUR NEGATIVE 04/25/2020 0755   BILIRUBINUR negative 09/07/2018 1034   KETONESUR NEGATIVE 04/25/2020 0755   PROTEINUR >=300 (A) 04/25/2020 0755   UROBILINOGEN 1.0 09/07/2018 1034   UROBILINOGEN 1.0 01/01/2017 1032   NITRITE NEGATIVE 04/25/2020 0755   LEUKOCYTESUR NEGATIVE 04/25/2020 0755   Sepsis Labs: '@LABRCNTIP'$ (procalcitonin:4,lacticidven:4) ) Recent Results (from the past 240 hour(s))  Resp Panel by RT-PCR (Flu A&B, Covid) Nasopharyngeal Swab     Status: None   Collection Time: 04/25/20  4:35 AM   Specimen: Nasopharyngeal Swab; Nasopharyngeal(NP) swabs in vial transport medium  Result Value Ref Range  Status   SARS Coronavirus 2 by RT PCR NEGATIVE NEGATIVE Final    Comment: (NOTE) SARS-CoV-2 target nucleic acids are NOT DETECTED.  The SARS-CoV-2 RNA is generally detectable in upper respiratory specimens during the acute phase of infection. The lowest concentration of SARS-CoV-2 viral copies this assay can detect is 138 copies/mL. A negative result does not preclude SARS-Cov-2 infection and should not be used as the sole basis for treatment or other patient management decisions. A negative result may occur with  improper specimen collection/handling, submission of specimen other than nasopharyngeal swab, presence of viral mutation(s) within the areas targeted by this assay, and inadequate number of viral copies(<138 copies/mL). A negative result must be combined with clinical observations, patient history, and epidemiological information. The expected result is Negative.  Fact Sheet for Patients:  EntrepreneurPulse.com.au  Fact Sheet for Healthcare Providers:  IncredibleEmployment.be  This test is no t yet approved or cleared by the [place] FDA and  has been authorized for detection and/or diagnosis of SARS-CoV-2 by FDA under an Emergency Use Authorization (EUA). This EUA will remain  in effect (meaning this test can be used) for the duration of the COVID-19 declaration under Section 564(b)(1) of the Act, 21 U.S.C.section 360bbb-3(b)(1), unless the authorization is terminated  or revoked sooner.       Influenza A by PCR NEGATIVE NEGATIVE Final   Influenza B by PCR NEGATIVE NEGATIVE Final    Comment: (NOTE) The Xpert Xpress SARS-CoV-2/FLU/RSV plus assay is intended as an aid in the diagnosis of influenza from Nasopharyngeal swab specimens and should not be used as a sole basis for treatment. Nasal washings and aspirates are unacceptable for Xpert Xpress SARS-CoV-2/FLU/RSV testing.  Fact Sheet for  Patients: EntrepreneurPulse.com.au  Fact Sheet for Healthcare Providers: IncredibleEmployment.be  This test is not yet approved or cleared by the [place] FDA and has been authorized for detection and/or diagnosis of SARS-CoV-2 by FDA under an Emergency Use Authorization (EUA). This EUA will remain in effect (meaning this test can be used) for the duration of the COVID-19 declaration under Section 564(b)(1) of the Act, 21 U.S.C. section 360bbb-3(b)(1), unless the authorization is terminated or revoked.  Performed at [facility], [place] [address], [place], [place] [address]          Radiology Studies: No results found.  Scheduled Meds: .  stroke: mapping our early stages of recovery book   Does not apply Once  . aspirin EC  81 mg Oral Daily  . atorvastatin  80 mg Oral q1800  . bictegravir-emtricitabine-tenofovir AF  1 tablet Oral Daily  . carvedilol  25 mg Oral BID WC  . dapsone  100 mg Oral Daily  . enoxaparin (LOVENOX) injection  40 mg Subcutaneous Q24H  . gabapentin  100 mg Oral TID  . icosapent Ethyl  2 g Oral BID  . insulin aspart  0-5 Units Subcutaneous QHS  . insulin aspart  0-9 Units Subcutaneous TID WC  . insulin detemir  40 Units Subcutaneous Daily  . oxybutynin  5 mg Oral BID  . ticagrelor  90 mg Oral BID   Continuous Infusions:    LOS: 6 days      Debbe Odea, MD [facility] Pager: [email or web address] 05/02/2020, 12:33 PM

## 2020-05-03 DIAGNOSIS — G546 Phantom limb syndrome with pain: Secondary | ICD-10-CM | POA: Diagnosis not present

## 2020-05-03 DIAGNOSIS — B20 Human immunodeficiency virus [HIV] disease: Secondary | ICD-10-CM | POA: Diagnosis not present

## 2020-05-03 DIAGNOSIS — Z89611 Acquired absence of right leg above knee: Secondary | ICD-10-CM | POA: Diagnosis not present

## 2020-05-03 DIAGNOSIS — E1165 Type 2 diabetes mellitus with hyperglycemia: Secondary | ICD-10-CM

## 2020-05-03 DIAGNOSIS — M129 Arthropathy, unspecified: Secondary | ICD-10-CM | POA: Diagnosis not present

## 2020-05-03 DIAGNOSIS — R2681 Unsteadiness on feet: Secondary | ICD-10-CM | POA: Diagnosis not present

## 2020-05-03 DIAGNOSIS — R279 Unspecified lack of coordination: Secondary | ICD-10-CM | POA: Diagnosis not present

## 2020-05-03 DIAGNOSIS — I739 Peripheral vascular disease, unspecified: Secondary | ICD-10-CM | POA: Diagnosis not present

## 2020-05-03 DIAGNOSIS — I10 Essential (primary) hypertension: Secondary | ICD-10-CM | POA: Diagnosis not present

## 2020-05-03 DIAGNOSIS — R531 Weakness: Secondary | ICD-10-CM | POA: Diagnosis not present

## 2020-05-03 DIAGNOSIS — I6930 Unspecified sequelae of cerebral infarction: Secondary | ICD-10-CM | POA: Diagnosis not present

## 2020-05-03 DIAGNOSIS — E119 Type 2 diabetes mellitus without complications: Secondary | ICD-10-CM | POA: Diagnosis not present

## 2020-05-03 DIAGNOSIS — M6281 Muscle weakness (generalized): Secondary | ICD-10-CM | POA: Diagnosis not present

## 2020-05-03 DIAGNOSIS — Z89511 Acquired absence of right leg below knee: Secondary | ICD-10-CM | POA: Diagnosis not present

## 2020-05-03 DIAGNOSIS — R278 Other lack of coordination: Secondary | ICD-10-CM | POA: Diagnosis not present

## 2020-05-03 DIAGNOSIS — G894 Chronic pain syndrome: Secondary | ICD-10-CM | POA: Diagnosis not present

## 2020-05-03 DIAGNOSIS — I2510 Atherosclerotic heart disease of native coronary artery without angina pectoris: Secondary | ICD-10-CM | POA: Diagnosis not present

## 2020-05-03 DIAGNOSIS — Z79899 Other long term (current) drug therapy: Secondary | ICD-10-CM | POA: Diagnosis not present

## 2020-05-03 DIAGNOSIS — E782 Mixed hyperlipidemia: Secondary | ICD-10-CM | POA: Diagnosis not present

## 2020-05-03 DIAGNOSIS — Z8673 Personal history of transient ischemic attack (TIA), and cerebral infarction without residual deficits: Secondary | ICD-10-CM | POA: Diagnosis not present

## 2020-05-03 DIAGNOSIS — Z89431 Acquired absence of right foot: Secondary | ICD-10-CM | POA: Diagnosis not present

## 2020-05-03 DIAGNOSIS — E559 Vitamin D deficiency, unspecified: Secondary | ICD-10-CM | POA: Diagnosis not present

## 2020-05-03 DIAGNOSIS — E11621 Type 2 diabetes mellitus with foot ulcer: Secondary | ICD-10-CM | POA: Diagnosis not present

## 2020-05-03 DIAGNOSIS — Z113 Encounter for screening for infections with a predominantly sexual mode of transmission: Secondary | ICD-10-CM | POA: Diagnosis not present

## 2020-05-03 DIAGNOSIS — E118 Type 2 diabetes mellitus with unspecified complications: Secondary | ICD-10-CM | POA: Diagnosis not present

## 2020-05-03 DIAGNOSIS — R2689 Other abnormalities of gait and mobility: Secondary | ICD-10-CM | POA: Diagnosis not present

## 2020-05-03 DIAGNOSIS — R69 Illness, unspecified: Secondary | ICD-10-CM | POA: Diagnosis not present

## 2020-05-03 DIAGNOSIS — R479 Unspecified speech disturbances: Secondary | ICD-10-CM | POA: Diagnosis not present

## 2020-05-03 DIAGNOSIS — R4182 Altered mental status, unspecified: Secondary | ICD-10-CM | POA: Diagnosis not present

## 2020-05-03 DIAGNOSIS — E785 Hyperlipidemia, unspecified: Secondary | ICD-10-CM | POA: Diagnosis not present

## 2020-05-03 DIAGNOSIS — I639 Cerebral infarction, unspecified: Secondary | ICD-10-CM | POA: Diagnosis not present

## 2020-05-03 DIAGNOSIS — E1142 Type 2 diabetes mellitus with diabetic polyneuropathy: Secondary | ICD-10-CM | POA: Diagnosis not present

## 2020-05-03 DIAGNOSIS — E162 Hypoglycemia, unspecified: Secondary | ICD-10-CM | POA: Diagnosis not present

## 2020-05-03 DIAGNOSIS — K009 Disorder of tooth development, unspecified: Secondary | ICD-10-CM | POA: Diagnosis not present

## 2020-05-03 DIAGNOSIS — N184 Chronic kidney disease, stage 4 (severe): Secondary | ICD-10-CM | POA: Diagnosis not present

## 2020-05-03 DIAGNOSIS — R5381 Other malaise: Secondary | ICD-10-CM | POA: Diagnosis not present

## 2020-05-03 DIAGNOSIS — I69354 Hemiplegia and hemiparesis following cerebral infarction affecting left non-dominant side: Secondary | ICD-10-CM | POA: Diagnosis not present

## 2020-05-03 LAB — GLUCOSE, CAPILLARY: Glucose-Capillary: 136 mg/dL — ABNORMAL HIGH (ref 70–99)

## 2020-05-03 MED ORDER — LEVEMIR 100 UNIT/ML ~~LOC~~ SOLN: 40.0000 [IU] | Freq: Every day | SUBCUTANEOUS | 3 refills | Status: DC

## 2020-05-03 MED ORDER — ACETAMINOPHEN 325 MG PO TABS: 650.0000 mg | ORAL_TABLET | ORAL | Status: DC | PRN

## 2020-05-03 MED ORDER — OXYCODONE HCL 10 MG PO TABS: 10.0000 mg | ORAL_TABLET | Freq: Four times a day (QID) | ORAL | 0 refills | Status: DC | PRN

## 2020-05-03 NOTE — Progress Notes (Signed)
Physical Therapy Treatment Patient Details Name: Keith [name] MRN: [name] DOB: [birth year] Today's Date: 05/03/2020    History of Present Illness 55 yo male presenting with left sided weakness and left facial droop. MRI on 1/18 showing subtle acute small vessel ischemia in the Right pons. Progression of symptoms on 1/20 and CT of head revealed decreased attenuation of R hald of pan consistent with evolving eschemia. PMH including HIV, DM2, CKD 4, CAD status post recent stenting last month for ST elevation MI with history of diabetes mellitus type 2, and right BKA & chronic anemia.    PT Comments    Pt tolerates treatment well but continues to demonstrate significant L weakness. Pt continues to require physical assistance to perform all functional mobility tasks, as well as UE support to maintain dynamic sitting balance. Pt demonstrates strong L lateral lean in standing and is unable to clear L foot without physical assistance for balance. PT provides encouragement of HEP to improve L side strength. Pt will continue to benefit from acute therapies to improve strength and reduce falls risk. PT recommends discharge to SNF.   Follow Up Recommendations  SNF;Supervision/Assistance - 24 hour     Equipment Recommendations  Other (comment);Wheelchair cushion (measurements PT);Wheelchair (measurements PT);Rolling walker with 5" wheels;3in1 (PT)    Recommendations for Other Services       Precautions / Restrictions Precautions Precautions: Fall Precaution Comments: hx of R BKA Required Braces or Orthoses: Other Brace Other Brace: R prosthesis in room Restrictions Weight Bearing Restrictions: No RLE Weight Bearing: Weight bearing as tolerated    Mobility  Bed Mobility Overal bed mobility: Needs Assistance Bed Mobility: Rolling;Sidelying to Sit Rolling: Supervision Sidelying to sit: Mod assist       General bed mobility comments: pt requires assistance to pull into sitting, unable to  push up from sidelying with LUE  Transfers Overall transfer level: Needs assistance Equipment used: Rolling walker (2 wheeled);1 person hand held assist (STEDY) Transfers: Sit to/from [facility] Sit to Stand: Mod assist;Min assist (modA with bilateral PT hand hold and L knee block, minA with use of STEDY. Pt unable to stand safely with use of RW) Stand pivot transfers: Max assist       General transfer comment: pt performs 1 sit to stand at edge of bed as well as SPT to recliner, then 3 sit to stands in STEDY  Ambulation/Gait Ambulation/Gait assistance: Mod assist;Max assist Gait Distance (Feet): 1 Feet (1 step forward and backward in STEDY, BUE support of STEDY bar) Assistive device:  (STEDY) Gait Pattern/deviations: Shuffle;Decreased weight shift to left;Decreased dorsiflexion - left Gait velocity: reduced Gait velocity interpretation: <1.31 ft/sec, indicative of household ambulator General Gait Details: Pt with use of BUE on STEDY grab bar, has reduced lateral lean toward L side which limits ability to clear RLE. Pt with increased hip flexion, leaning on STEDY at times. Limited L foot clearance due to DF weakness   Stairs             Wheelchair Mobility    Modified Rankin (Stroke Patients Only) Modified Rankin (Stroke Patients Only) Pre-Morbid Rankin Score: No symptoms Modified Rankin: Moderately severe disability     Balance Overall balance assessment: Needs assistance Sitting-balance support: No upper extremity supported;Feet supported Sitting balance-Leahy Scale: Poor Sitting balance - Comments: pt unable to maintain balance when attempting to don prosthetic at edge of bed Postural control: Left lateral lean Standing balance support: Bilateral upper extremity supported Standing balance-Leahy Scale: Poor Standing balance comment:  modA with UE support of STEDY or PT hand hold                            Cognition Arousal/Alertness:  Awake/alert Behavior During Therapy: WFL for tasks assessed/performed;Impulsive Overall Cognitive Status: Impaired/Different from baseline Area of Impairment: Awareness;Problem solving;Safety/judgement                         Safety/Judgement: Decreased awareness of deficits;Decreased awareness of safety Awareness: Emergent Problem Solving: Slow processing;Requires verbal cues;Difficulty sequencing        Exercises General Exercises - Lower Extremity Ankle Circles/Pumps: AROM;Left;5 reps Gluteal Sets: AROM;Both;10 reps Long Arc Quad: AROM;Both;5 reps    General Comments General comments (skin integrity, edema, etc.): VSS on RA      Pertinent Vitals/Pain Pain Assessment: No/denies pain    Home Living                      Prior Function            PT Goals (current goals can now be found in the care plan section) Acute Rehab PT Goals Patient Stated Goal: to improve Progress towards PT goals: Not progressing toward goals - comment (gait remains limited by L weakness and imbalance)    Frequency    Min 4X/week      PT Plan Current plan remains appropriate    Co-evaluation              AM-PAC PT "6 Clicks" Mobility   Outcome Measure  Help needed turning from your back to your side while in a flat bed without using bedrails?: A Little Help needed moving from lying on your back to sitting on the side of a flat bed without using bedrails?: A Lot Help needed moving to and from a bed to a chair (including a wheelchair)?: A Lot Help needed standing up from a chair using your arms (e.g., wheelchair or bedside chair)?: A Lot Help needed to walk in hospital room?: A Lot Help needed climbing 3-5 steps with a railing? : Total 6 Click Score: 12    End of Session   Activity Tolerance: Patient tolerated treatment well Patient left: in chair;with call bell/phone within reach;with chair alarm set Nurse Communication: Mobility status;Need for lift  equipment PT Visit Diagnosis: Unsteadiness on feet (R26.81);Hemiplegia and hemiparesis;Difficulty in walking, not elsewhere classified (R26.2);Other abnormalities of gait and mobility (R26.89);Muscle weakness (generalized) (M62.81);Other symptoms and signs involving the nervous system (R29.898) Hemiplegia - Right/Left: Left Hemiplegia - dominant/non-dominant: Non-dominant Hemiplegia - caused by: Cerebral infarction     Time: [id number] PT Time Calculation (min) (ACUTE ONLY): 30 min  Charges:  $Therapeutic Activity: 23-37 mins                     Zenaida Niece, PT, DPT Acute Rehabilitation Pager: [phone]    Zenaida Niece 05/03/2020, 12:57 PM

## 2020-05-03 NOTE — Care Management Important Message (Signed)
Important Message  Patient Details  Name: Keith [name] MRN: [name] Date of Birth: [birth year]   Medicare Important Message Given:  Yes - Important Message mailed due to current National Emergency   Verbal consent obtained due to current National Emergency  Relationship to patient: Self Contact Name: Edwen Rocchio Call Date: 05/03/20    Phone: [phone] Outcome: No Answer/Busy Important Message mailed to: Patient address on file    Delorse Lek 05/03/2020, 2:34 PM

## 2020-05-03 NOTE — Progress Notes (Signed)
Pt discharge education and instructions completed. Pt discharge to [facility] and report called off to the facility. Phineas Semen RN. Pt waiting on [facility] to come pick up to transport off to disposition. Will continue to closely monitor till pt pick up. P. Angelica Pou RN

## 2020-05-03 NOTE — Discharge Summary (Addendum)
Physician Discharge Summary  STILLMAN BUENGER YQI:[id number] DOB: 01/20/1966 DOA: 04/25/2020  PCP: [name], FNP  Admit date: 04/25/2020 Discharge date: 05/03/2020  Admitted From: home Disposition:  SNF     Discharge Condition:  stable   CODE STATUS:  Full code   Diet recommendation:  Heart healthy and diabetic  Consultations:  neurology  Procedures/Studies: . 2 D ECHO   Discharge Diagnoses:  Principal Problem:   Acute cerebrovascular accident (CVA) due to ischemia [facility]) Active Problems:   Essential hypertension   Insulin-requiring or dependent type II diabetes mellitus ([place])   HIV disease ([place])   CKD (chronic kidney disease) stage 4, GFR 15-29 ml/min (HCC)   Hx of BKA, right ([place])   Hypoglycemia   Type 2 diabetes mellitus with hyperlipidemia ([place])   PAD (peripheral artery disease) ([place])     Brief Summary: Keith [name] is a 55 y.o.malewith PMH significant for   DM2,CKD 4,AIDs, CAD status post recent stenting last month for ST elevation MI with history of diabetes mellitus type 2, right BKA & chronicanemia.  He presented to the ED via EMS for complaints of left arm weakness that had started in the middle of the night.  He was further noted to have a left-sided facial droop and left leg weakness and slurred speech.  His blood glucose was in the 60s.    A code stroke was called and the patient was evaluated by neurology in the ED.  His neurological symptoms improved while he was in the ED.  However, he has been having episodic progression of his neurological symptoms during his hospital stay.  Initial MRI imaging on 1/18 revealed subtle acute small vessel ischemia in the Right Pons, with a nearby chronic right paracentral pontine lacune (see also #3).  Due to progression of symptoms on 1/20, a CT of the head without contrast was obtained which revealed the following: decreased attenuation in the right half of the pons consistent with evolving  ischemia.  Hospital Course:  Principal Problem:   Acute cerebrovascular accident (CVA) due to ischemia/atherosclerotic disease -The patient has had a right pontine infarct with symptoms of waxing and waning slurred speech, left facial droop, left arm and left leg weakness -His MRA reveals atherosclerotic disease and stenosis in multiple vessels - Appreciate care from stroke team- recommendations are to continue aspirin 81 mg and Brilinta 90 mg twice daily which were prescribed after his MI/stenting -He has significant risk factors for stroke including uncontrolled diabetes and hyperlipidemia - Cholesterol is 280, LDL 205 and HDL 32 -Hemoglobin A1c on 12/17 was > 15.5 - -last 2D echo revealed an EF of 55 to 60% and grade 1 diastolic dysfunction-no suspicion for embolic infarct -Physical therapy recommends skilled nursing facility or CIR- CIR states he is not a candidate as he does not have a person (other than his frail mother) to help him at home  Active Problems:   Essential hypertension -Continue carvedilol -hold amlodipine as BP is slightly low  Coronary artery disease status post stenting-peripheral artery disease Hyperlipidemia - He has been on aspirin and Brilinta since 12/21 when he was admitted with an acute MI which we are continuing - Continue statin and Vascepa    Insulin-requiring or dependent type II diabetes mellitus uncontrolled with hyperglycemia - due to hypoglycemia, Levemir dose reduced from home dose- sugars stable since    HIV disease ([place]) -Continue Biktarvy and Dapsone and outpt ID follow ups  Ref. Range 02/08/2014 16:30 07/06/2014 10:00 01/06/2015 09:00 08/29/2015  15:00 05/13/2016 16:12 04/28/2017 23:47 06/06/2018 13:11 09/01/2019 16:06  CD4 T Cell Abs Latest Ref Range: 400 - 1,790 /uL 150 (L) 120 (L) 140 (L) 220 (L) 210 (L) 80 (L) 140 (L) 89 (L)  CD4 % Helper T Cell Latest Ref Range: 33 - 65 % 9 (L) 7 (L) 8 (L) 8 (L) 10 (L) 6 (L) 6 (L) 3 (L)     CKD (chronic kidney  disease) stage 4, GFR 15-29 ml/min -His creatinine is at baseline which appears to range from 2.2-2.9    Hx of BKA, right   Normocytic anemia - Likely anemia of chronic disease     Discharge Exam: Vitals:   05/03/20 0355 05/03/20 0800  BP: 98/77 117/66  Pulse: 61 60  Resp: 18 18  Temp: 98 F (36.7 C) 97.6 F (36.4 C)  SpO2: 99% 100%   Vitals:   05/02/20 2300 05/03/20 0005 05/03/20 0355 05/03/20 0800  BP:  113/81 98/77 117/66  Pulse:  64 61 60  Resp: $Remo'17 18 18 18  'ysNMp$ Temp:  97.8 F (36.6 C) 98 F (36.7 C) 97.6 F (36.4 C)  TempSrc:  Oral Oral Oral  SpO2:  100% 99% 100%  Weight:      Height:        General: Pt is alert, awake, not in acute distress Cardiovascular: RRR, S1/S2 +, no rubs, no gallops Respiratory: CTA bilaterally, no wheezing, no rhonchi Abdominal: Soft, NT, ND, bowel sounds + Extremities: no edema, no cyanosis   Discharge Instructions  Discharge Instructions    Diet - low sodium heart healthy   Complete by: As directed    Diet Carb Modified   Complete by: As directed    Increase activity slowly   Complete by: As directed      Allergies as of 05/03/2020      Reactions   Elemental Sulfur Itching   Patient stated he's allergic to "sulfur" AND "sulfa"   Sulfa Antibiotics Itching      Medication List    STOP taking these medications   amLODipine 10 MG tablet Commonly known as: NORVASC     TAKE these medications   acetaminophen 325 MG tablet Commonly known as: TYLENOL Take 2 tablets (650 mg total) by mouth every 4 (four) hours as needed for mild pain, fever or headache.   aspirin 81 MG EC tablet Take 1 tablet (81 mg total) by mouth daily.   atorvastatin 80 MG tablet Commonly known as: LIPITOR Take 1 tablet (80 mg total) by mouth daily at 6 PM.   Biktarvy 50-200-25 MG Tabs tablet Generic drug: bictegravir-emtricitabine-tenofovir AF TAKE 1 TABLET BY MOUTH DAILY.   blood glucose meter kit and supplies Dispense based on patient  and insurance preference. Use up to four times daily as directed. (FOR ICD-10 E10.9, E11.9).   carvedilol 25 MG tablet Commonly known as: COREG Take 1 tablet (25 mg total) by mouth 2 (two) times daily with a meal. Please make appt for future refills. [phone]. 1st attempt.   Cool Blood Glucose Test Strips test strip Generic drug: glucose blood Use as instructed   dapsone 100 MG tablet Take 1 tablet (100 mg total) by mouth daily.   Easy Comfort Pen Needles 31G X 5 MM Misc Generic drug: Insulin Pen Needle INJECT 20 UNITS DAILY AS DIRECTED.   gabapentin 100 MG capsule Commonly known as: NEURONTIN Take 1 capsule (100 mg total) by mouth 3 (three) times daily.   icosapent Ethyl 1 g capsule Commonly known as:  Vascepa Take 2 capsules (2 g total) by mouth 2 (two) times daily.   Insulin Syringes (Disposable) U-100 0.3 ML Misc 1 Syringe by Does not apply route 3 (three) times daily.   Levemir 100 UNIT/ML injection Generic drug: insulin detemir Inject 0.4 mLs (40 Units total) into the skin daily. What changed: how much to take   nitroGLYCERIN 0.4 MG SL tablet Commonly known as: NITROSTAT Place 1 tablet (0.4 mg total) under the tongue every 5 (five) minutes x 3 doses as needed for chest pain.   NovoLOG FlexPen 100 UNIT/ML FlexPen Generic drug: insulin aspart Inject 10 units under skin 3 times a day, with meals. What changed:   how much to take  how to take this  when to take this   onetouch ultrasoft lancets Use as instructed   oxybutynin 5 MG tablet Commonly known as: DITROPAN Take 1 tablet (5 mg total) by mouth 2 (two) times daily.   Oxycodone HCl 10 MG Tabs Take 1 tablet (10 mg total) by mouth every 6 (six) hours as needed for severe pain (Severe pain.). What changed:   when to take this  reasons to take this   ticagrelor 90 MG Tabs tablet Commonly known as: BRILINTA Take 1 tablet (90 mg total) by mouth 2 (two) times daily.            Durable Medical  Equipment  (From admission, onward)         Start     Ordered   04/26/20 1421  For home use only DME 4 wheeled rolling walker with seat  Once       Question:  Patient needs a walker to treat with the following condition  Answer:  CVA (cerebral vascular accident) ([place])   04/26/20 1420          Allergies  Allergen Reactions  . Elemental Sulfur Itching    Patient stated he's allergic to "sulfur" AND "sulfa"  . Sulfa Antibiotics Itching      CT HEAD WO CONTRAST  Result Date: 04/27/2020 CLINICAL DATA:  Left-sided weakness EXAM: CT HEAD WITHOUT CONTRAST TECHNIQUE: Contiguous axial images were obtained from the base of the skull through the vertex without intravenous contrast. COMPARISON:  04/25/2020 FINDINGS: Brain: More well-defined area of decreased attenuation is identified in the right half of the pons similar to that seen on prior MRI examination consistent with evolving ischemia. No new area of hemorrhage, infarct or space-occupying mass lesion is seen. Vascular: No hyperdense vessel or unexpected calcification. Skull: Normal. Negative for fracture or focal lesion. Sinuses/Orbits: No acute finding. Other: None. IMPRESSION: Better defined area of decreased attenuation in the right half of the pons consistent with evolving ischemia. No new focal abnormality is noted. Electronically Signed   By: Inez Catalina M.D.   On: 04/27/2020 16:01   MR ANGIO HEAD WO CONTRAST  Result Date: 04/25/2020 CLINICAL DATA:  55 year old male code stroke presentation. Left side weakness, slurred speech. EXAM: MRI HEAD WITHOUT CONTRAST MRA HEAD WITHOUT CONTRAST MRA NECK WITHOUT AND WITH CONTRAST TECHNIQUE: Multiplanar, multiecho pulse sequences of the brain and surrounding structures were obtained without intravenous contrast. Angiographic images of the Circle of Willis were obtained using MRA technique without intravenous contrast. Angiographic images of the neck were obtained using MRA technique without and with  intravenous contrast. Carotid stenosis measurements (when applicable) are obtained utilizing NASCET criteria, using the distal internal carotid diameter as the denominator. CONTRAST:  45mL GADAVIST GADOBUTROL 1 MMOL/ML IV SOLN COMPARISON:  Plain head  CT 0135 hours today, and earlier. FINDINGS: MRI HEAD FINDINGS Brain: Questionable punctate/vague diffusion restriction in the right pons (series 7, image 52). Nearby there is a chronic appearing right paracentral pontine lacunar infarct (series 10, image 65). Otherwise No restricted diffusion to suggest acute infarction. No midline shift, mass effect, evidence of mass lesion, ventriculomegaly, extra-axial collection or acute intracranial hemorrhage. Cervicomedullary junction and pituitary are within normal limits. Outside of the right pons gray and white matter signal is largely normal for age throughout the brain. No cortical encephalomalacia or chronic cerebral blood products identified. Vascular: Major intracranial vascular flow voids are preserved. Skull and upper cervical spine: Negative. Sinuses/Orbits: Postoperative changes to both globes, bilateral chronic lamina papyracea fractures. Otherwise negative. Other: Mastoids are clear. Visible internal auditory structures appear normal. Visible scalp and face appear negative. MRA NECK FINDINGS Precontrast time-of-flight images demonstrate antegrade flow in both cervical carotid and vertebral arteries. The right vertebral artery appears slightly dominant. Carotid bifurcations appear within normal limits. Post-contrast neck MRA images reveal a 3 vessel arch configuration. Tortuous brachiocephalic artery. Normal right CCA aside from mild proximal tortuosity. Right carotid bifurcation is widely patent. Minimal irregularity of the proximal right ICA. Otherwise negative cervical right ICA. Suggestion of mild to moderate supraclinoid ICA stenosis (series 1093, image 3). Patent right ICA terminus. Normal left CCA and left  carotid bifurcation aside from some tortuosity. Cervical left ICA and visible left ICA siphon appear negative. Proximal subclavian arteries are patent without stenosis. Normal vertebral artery origins. Fairly codominant vertebral arteries are patent and within normal limits to the skull base. MRA HEAD FINDINGS Antegrade flow in the posterior circulation. Right V4 segment appears mildly dominant. Normal distal vertebral arteries and vertebrobasilar junction. Patent right PICA origin. The left AICA is patent and may be dominant. There is mild to moderate irregularity of the basilar artery although no significant stenosis (series 1053, image 11). Patent SCA and PCA origins. Posterior communicating arteries are diminutive or absent. Bilateral PCA branches are within normal limits. Antegrade flow in both ICA siphons. Normal left siphon. The right siphon appears normal to the supraclinoid segment where mild to moderate irregularity and mild stenosis is redemonstrated (series 1043, image 6). Patent bilateral carotid termini. Ophthalmic artery origins appear normal. Patent MCA and ACA origins. Diminutive anterior communicating artery. There is mild stenosis of the distal right ACA A2 segment. Visible left ACA branches are within normal limits. Left MCA M1 segment and trifurcation are patent without stenosis. Right MCA M1 segment and bifurcation are patent without stenosis. Visible bilateral MCA branches are within normal limits. IMPRESSION: 1. Suspicion of subtle acute small vessel ischemia in the Right Pons, with a nearby chronic right paracentral pontine lacune (see also #3). No associated hemorrhage or mass effect. 2. Otherwise normal for age non-contrast MRI appearance of the brain. 3. MRA Head and Neck is positive for evidence of atherosclerosis in the: - mid Basilar Artery (mild stenosis), at the - distal Right ICA siphon (mild-to-moderate stenosis), and - Right ACA A2 segment (mild stenosis). No other posterior  circulation stenosis. No cervical carotid stenosis. Electronically Signed   By: Genevie Ann M.D.   On: 04/25/2020 06:12   MR Angiogram Neck W or Wo Contrast  Result Date: 04/25/2020 CLINICAL DATA:  55 year old male code stroke presentation. Left side weakness, slurred speech. EXAM: MRI HEAD WITHOUT CONTRAST MRA HEAD WITHOUT CONTRAST MRA NECK WITHOUT AND WITH CONTRAST TECHNIQUE: Multiplanar, multiecho pulse sequences of the brain and surrounding structures were obtained without intravenous contrast. Angiographic images  of the Circle of Willis were obtained using MRA technique without intravenous contrast. Angiographic images of the neck were obtained using MRA technique without and with intravenous contrast. Carotid stenosis measurements (when applicable) are obtained utilizing NASCET criteria, using the distal internal carotid diameter as the denominator. CONTRAST:  28mL GADAVIST GADOBUTROL 1 MMOL/ML IV SOLN COMPARISON:  Plain head CT 0135 hours today, and earlier. FINDINGS: MRI HEAD FINDINGS Brain: Questionable punctate/vague diffusion restriction in the right pons (series 7, image 52). Nearby there is a chronic appearing right paracentral pontine lacunar infarct (series 10, image 65). Otherwise No restricted diffusion to suggest acute infarction. No midline shift, mass effect, evidence of mass lesion, ventriculomegaly, extra-axial collection or acute intracranial hemorrhage. Cervicomedullary junction and pituitary are within normal limits. Outside of the right pons gray and white matter signal is largely normal for age throughout the brain. No cortical encephalomalacia or chronic cerebral blood products identified. Vascular: Major intracranial vascular flow voids are preserved. Skull and upper cervical spine: Negative. Sinuses/Orbits: Postoperative changes to both globes, bilateral chronic lamina papyracea fractures. Otherwise negative. Other: Mastoids are clear. Visible internal auditory structures appear normal.  Visible scalp and face appear negative. MRA NECK FINDINGS Precontrast time-of-flight images demonstrate antegrade flow in both cervical carotid and vertebral arteries. The right vertebral artery appears slightly dominant. Carotid bifurcations appear within normal limits. Post-contrast neck MRA images reveal a 3 vessel arch configuration. Tortuous brachiocephalic artery. Normal right CCA aside from mild proximal tortuosity. Right carotid bifurcation is widely patent. Minimal irregularity of the proximal right ICA. Otherwise negative cervical right ICA. Suggestion of mild to moderate supraclinoid ICA stenosis (series 1093, image 3). Patent right ICA terminus. Normal left CCA and left carotid bifurcation aside from some tortuosity. Cervical left ICA and visible left ICA siphon appear negative. Proximal subclavian arteries are patent without stenosis. Normal vertebral artery origins. Fairly codominant vertebral arteries are patent and within normal limits to the skull base. MRA HEAD FINDINGS Antegrade flow in the posterior circulation. Right V4 segment appears mildly dominant. Normal distal vertebral arteries and vertebrobasilar junction. Patent right PICA origin. The left AICA is patent and may be dominant. There is mild to moderate irregularity of the basilar artery although no significant stenosis (series 1053, image 11). Patent SCA and PCA origins. Posterior communicating arteries are diminutive or absent. Bilateral PCA branches are within normal limits. Antegrade flow in both ICA siphons. Normal left siphon. The right siphon appears normal to the supraclinoid segment where mild to moderate irregularity and mild stenosis is redemonstrated (series 1043, image 6). Patent bilateral carotid termini. Ophthalmic artery origins appear normal. Patent MCA and ACA origins. Diminutive anterior communicating artery. There is mild stenosis of the distal right ACA A2 segment. Visible left ACA branches are within normal limits.  Left MCA M1 segment and trifurcation are patent without stenosis. Right MCA M1 segment and bifurcation are patent without stenosis. Visible bilateral MCA branches are within normal limits. IMPRESSION: 1. Suspicion of subtle acute small vessel ischemia in the Right Pons, with a nearby chronic right paracentral pontine lacune (see also #3). No associated hemorrhage or mass effect. 2. Otherwise normal for age non-contrast MRI appearance of the brain. 3. MRA Head and Neck is positive for evidence of atherosclerosis in the: - mid Basilar Artery (mild stenosis), at the - distal Right ICA siphon (mild-to-moderate stenosis), and - Right ACA A2 segment (mild stenosis). No other posterior circulation stenosis. No cervical carotid stenosis. Electronically Signed   By: Genevie Ann M.D.   On: 04/25/2020  06:12   MR BRAIN WO CONTRAST  Result Date: 04/25/2020 CLINICAL DATA:  55 year old male code stroke presentation. Left side weakness, slurred speech. EXAM: MRI HEAD WITHOUT CONTRAST MRA HEAD WITHOUT CONTRAST MRA NECK WITHOUT AND WITH CONTRAST TECHNIQUE: Multiplanar, multiecho pulse sequences of the brain and surrounding structures were obtained without intravenous contrast. Angiographic images of the Circle of Willis were obtained using MRA technique without intravenous contrast. Angiographic images of the neck were obtained using MRA technique without and with intravenous contrast. Carotid stenosis measurements (when applicable) are obtained utilizing NASCET criteria, using the distal internal carotid diameter as the denominator. CONTRAST:  64mL GADAVIST GADOBUTROL 1 MMOL/ML IV SOLN COMPARISON:  Plain head CT 0135 hours today, and earlier. FINDINGS: MRI HEAD FINDINGS Brain: Questionable punctate/vague diffusion restriction in the right pons (series 7, image 52). Nearby there is a chronic appearing right paracentral pontine lacunar infarct (series 10, image 65). Otherwise No restricted diffusion to suggest acute infarction. No  midline shift, mass effect, evidence of mass lesion, ventriculomegaly, extra-axial collection or acute intracranial hemorrhage. Cervicomedullary junction and pituitary are within normal limits. Outside of the right pons gray and white matter signal is largely normal for age throughout the brain. No cortical encephalomalacia or chronic cerebral blood products identified. Vascular: Major intracranial vascular flow voids are preserved. Skull and upper cervical spine: Negative. Sinuses/Orbits: Postoperative changes to both globes, bilateral chronic lamina papyracea fractures. Otherwise negative. Other: Mastoids are clear. Visible internal auditory structures appear normal. Visible scalp and face appear negative. MRA NECK FINDINGS Precontrast time-of-flight images demonstrate antegrade flow in both cervical carotid and vertebral arteries. The right vertebral artery appears slightly dominant. Carotid bifurcations appear within normal limits. Post-contrast neck MRA images reveal a 3 vessel arch configuration. Tortuous brachiocephalic artery. Normal right CCA aside from mild proximal tortuosity. Right carotid bifurcation is widely patent. Minimal irregularity of the proximal right ICA. Otherwise negative cervical right ICA. Suggestion of mild to moderate supraclinoid ICA stenosis (series 1093, image 3). Patent right ICA terminus. Normal left CCA and left carotid bifurcation aside from some tortuosity. Cervical left ICA and visible left ICA siphon appear negative. Proximal subclavian arteries are patent without stenosis. Normal vertebral artery origins. Fairly codominant vertebral arteries are patent and within normal limits to the skull base. MRA HEAD FINDINGS Antegrade flow in the posterior circulation. Right V4 segment appears mildly dominant. Normal distal vertebral arteries and vertebrobasilar junction. Patent right PICA origin. The left AICA is patent and may be dominant. There is mild to moderate irregularity of the  basilar artery although no significant stenosis (series 1053, image 11). Patent SCA and PCA origins. Posterior communicating arteries are diminutive or absent. Bilateral PCA branches are within normal limits. Antegrade flow in both ICA siphons. Normal left siphon. The right siphon appears normal to the supraclinoid segment where mild to moderate irregularity and mild stenosis is redemonstrated (series 1043, image 6). Patent bilateral carotid termini. Ophthalmic artery origins appear normal. Patent MCA and ACA origins. Diminutive anterior communicating artery. There is mild stenosis of the distal right ACA A2 segment. Visible left ACA branches are within normal limits. Left MCA M1 segment and trifurcation are patent without stenosis. Right MCA M1 segment and bifurcation are patent without stenosis. Visible bilateral MCA branches are within normal limits. IMPRESSION: 1. Suspicion of subtle acute small vessel ischemia in the Right Pons, with a nearby chronic right paracentral pontine lacune (see also #3). No associated hemorrhage or mass effect. 2. Otherwise normal for age non-contrast MRI appearance of the brain. 3.  MRA Head and Neck is positive for evidence of atherosclerosis in the: - mid Basilar Artery (mild stenosis), at the - distal Right ICA siphon (mild-to-moderate stenosis), and - Right ACA A2 segment (mild stenosis). No other posterior circulation stenosis. No cervical carotid stenosis. Electronically Signed   By: Genevie Ann M.D.   On: 04/25/2020 06:12   ECHOCARDIOGRAM COMPLETE BUBBLE STUDY  Result Date: 04/26/2020    ECHOCARDIOGRAM REPORT   Patient Name:   Keith [name] Behring Date of Exam: 04/26/2020 Medical Rec #:  [id number]      Height:       69.0 in Accession #:    [id number]     Weight:       259.0 lb Date of Birth:  [birth year]      BSA:          2.306 m Patient Age:    58 years       BP:           140/88 mmHg Patient Gender: M              HR:           66 bpm. Exam Location:  Inpatient Procedure: 2D Echo,  Cardiac Doppler, Color Doppler and [place] Contrast Bubble            Study Indications:    Stroke  History:        Patient has prior history of Echocardiogram examinations, most                 recent 05/24/2019. Previous Myocardial Infarction and CAD,                 Stroke; Risk Factors:Hypertension, Dyslipidemia and Diabetes.  Sonographer:    Roseanna Rainbow RDCS Referring Phys: [id number] [facility]  Sonographer Comments: Image acquisition challenging due to respiratory motion. IMPRESSIONS  1. Left ventricular ejection fraction, by estimation, is 50 to 55%. The left ventricle has low normal function. The left ventricle has no regional wall motion abnormalities. There is mild concentric left ventricular hypertrophy. Left ventricular diastolic parameters are consistent with Grade I diastolic dysfunction (impaired relaxation).  2. Right ventricular systolic function is normal. The right ventricular size is normal.  3. The mitral valve is normal in structure. Trivial mitral valve regurgitation. No evidence of mitral stenosis.  4. The aortic valve is tricuspid. Aortic valve regurgitation is not visualized. No aortic stenosis is present.  5. Aortic dilatation noted. There is mild dilatation of the ascending aorta, measuring 39 mm.  6. The inferior vena cava is normal in size with greater than 50% respiratory variability, suggesting right atrial pressure of 3 mmHg.  7. Agitated saline contrast bubble study was negative, with no evidence of any interatrial shunt. FINDINGS  Left Ventricle: Left ventricular ejection fraction, by estimation, is 50 to 55%. The left ventricle has low normal function. The left ventricle has no regional wall motion abnormalities. The left ventricular internal cavity size was normal in size. There is mild concentric left ventricular hypertrophy. Left ventricular diastolic parameters are consistent with Grade I diastolic dysfunction (impaired relaxation). Indeterminate filling pressures. Right  Ventricle: The right ventricular size is normal. No increase in right ventricular wall thickness. Right ventricular systolic function is normal. Left Atrium: Left atrial size was normal in size. Right Atrium: Right atrial size was normal in size. Pericardium: There is no evidence of pericardial effusion. Mitral Valve: The mitral valve is normal in structure. Trivial mitral valve regurgitation. No evidence of  mitral valve stenosis. Tricuspid Valve: The tricuspid valve is normal in structure. Tricuspid valve regurgitation is not demonstrated. No evidence of tricuspid stenosis. Aortic Valve: The aortic valve is tricuspid. Aortic valve regurgitation is not visualized. No aortic stenosis is present. Pulmonic Valve: The pulmonic valve was normal in structure. Pulmonic valve regurgitation is not visualized. No evidence of pulmonic stenosis. Aorta: Aortic dilatation noted. There is mild dilatation of the ascending aorta, measuring 39 mm. Venous: The inferior vena cava is normal in size with greater than 50% respiratory variability, suggesting right atrial pressure of 3 mmHg. IAS/Shunts: No atrial level shunt detected by color flow Doppler. Agitated saline contrast was given intravenously to evaluate for intracardiac shunting. Agitated saline contrast bubble study was negative, with no evidence of any interatrial shunt.  LEFT VENTRICLE PLAX 2D LVIDd:         4.50 cm      Diastology LVIDs:         3.30 cm      LV e' medial:    5.44 cm/s LV PW:         1.11 cm      LV E/e' medial:  13.9 LV IVS:        1.30 cm      LV e' lateral:   7.72 cm/s LVOT diam:     2.40 cm      LV E/e' lateral: 9.8 LV SV:         81 LV SV Index:   35 LVOT Area:     4.52 cm  LV Volumes (MOD) LV vol d, MOD A2C: 111.0 ml LV vol d, MOD A4C: 114.0 ml LV vol s, MOD A2C: 50.9 ml LV vol s, MOD A4C: 53.6 ml LV SV MOD A2C:     60.1 ml LV SV MOD A4C:     114.0 ml LV SV MOD BP:      61.7 ml RIGHT VENTRICLE            IVC RV S prime:     9.90 cm/s  IVC diam: 1.40  cm TAPSE (M-mode): 1.9 cm LEFT ATRIUM           Index       RIGHT ATRIUM           Index LA diam:      2.90 cm 1.26 cm/m  RA Area:     13.80 cm LA Vol (A2C): 38.7 ml 16.78 ml/m RA Volume:   29.90 ml  12.97 ml/m LA Vol (A4C): 31.1 ml 13.49 ml/m  AORTIC VALVE LVOT Vmax:   95.30 cm/s LVOT Vmean:  67.900 cm/s LVOT VTI:    0.178 m  AORTA Ao Root diam: 4.90 cm Ao Asc diam:  3.90 cm MITRAL VALVE MV Area (PHT): 2.24 cm    SHUNTS MV Decel Time: 338 msec    Systemic VTI:  0.18 m MV E velocity: 75.40 cm/s  Systemic Diam: 2.40 cm MV A velocity: 85.70 cm/s MV E/A ratio:  0.88 Skeet Latch MD Electronically signed by Skeet Latch MD Signature Date/Time: 04/26/2020/11:47:17 AM    Final    CT HEAD CODE STROKE WO CONTRAST  Result Date: 04/25/2020 CLINICAL DATA:  Code stroke. Initial evaluation for slurred speech, left-sided weakness. [place]ma side EXAM: CT HEAD WITHOUT CONTRAST TECHNIQUE: Contiguous axial images were obtained from the base of the skull through the vertex without intravenous contrast. COMPARISON:  Prior CT from 10/11/2018. FINDINGS: Brain: Cerebral volume within normal limits. No acute intracranial hemorrhage. No  acute large vessel territory infarct. No mass lesion, midline shift or mass effect. No hydrocephalus or extra-axial fluid collection. Vascular: No hyperdense vessel. Skull: Scalp soft tissues and calvarium within normal limits. Sinuses/Orbits: Globes and orbital soft tissues demonstrate no acute finding. Probable remote fractures noted involving the bilateral lamina papyracea. Mild mucosal thickening noted about the maxillary sinuses. Paranasal sinuses are otherwise clear. No mastoid effusion. Other: None. ASPECTS [facility] Stroke Program Early CT Score) - Ganglionic level infarction (caudate, lentiform nuclei, internal capsule, insula, M1-M3 cortex): 7 - Supraganglionic infarction (M4-M6 cortex): 3 Total score (0-10 with 10 being normal): 10 IMPRESSION: 1. No acute intracranial infarct or other  abnormality. 2. ASPECTS is 10. These results were communicated to Dr. Theda Sers at [place] 1/18/2022by text page via the [facility] messaging system. Electronically Signed   By: Jeannine Boga M.D.   On: 04/25/2020 01:57     The results of significant diagnostics from this hospitalization (including imaging, microbiology, ancillary and laboratory) are listed below for reference.     Microbiology: Recent Results (from the past 240 hour(s))  Resp Panel by RT-PCR (Flu A&B, Covid) Nasopharyngeal Swab     Status: None   Collection Time: 04/25/20  4:35 AM   Specimen: Nasopharyngeal Swab; Nasopharyngeal(NP) swabs in vial transport medium  Result Value Ref Range Status   SARS Coronavirus 2 by RT PCR NEGATIVE NEGATIVE Final    Comment: (NOTE) SARS-CoV-2 target nucleic acids are NOT DETECTED.  The SARS-CoV-2 RNA is generally detectable in upper respiratory specimens during the acute phase of infection. The lowest concentration of SARS-CoV-2 viral copies this assay can detect is 138 copies/mL. A negative result does not preclude SARS-Cov-2 infection and should not be used as the sole basis for treatment or other patient management decisions. A negative result may occur with  improper specimen collection/handling, submission of specimen other than nasopharyngeal swab, presence of viral mutation(s) within the areas targeted by this assay, and inadequate number of viral copies(<138 copies/mL). A negative result must be combined with clinical observations, patient history, and epidemiological information. The expected result is Negative.  Fact Sheet for Patients:  EntrepreneurPulse.com.au  Fact Sheet for Healthcare Providers:  IncredibleEmployment.be  This test is no t yet approved or cleared by the [place] FDA and  has been authorized for detection and/or diagnosis of SARS-CoV-2 by FDA under an Emergency Use Authorization (EUA). This EUA will remain   in effect (meaning this test can be used) for the duration of the COVID-19 declaration under Section 564(b)(1) of the Act, 21 U.S.C.section 360bbb-3(b)(1), unless the authorization is terminated  or revoked sooner.       Influenza A by PCR NEGATIVE NEGATIVE Final   Influenza B by PCR NEGATIVE NEGATIVE Final    Comment: (NOTE) The Xpert Xpress SARS-CoV-2/FLU/RSV plus assay is intended as an aid in the diagnosis of influenza from Nasopharyngeal swab specimens and should not be used as a sole basis for treatment. Nasal washings and aspirates are unacceptable for Xpert Xpress SARS-CoV-2/FLU/RSV testing.  Fact Sheet for Patients: EntrepreneurPulse.com.au  Fact Sheet for Healthcare Providers: IncredibleEmployment.be  This test is not yet approved or cleared by the [place] FDA and has been authorized for detection and/or diagnosis of SARS-CoV-2 by FDA under an Emergency Use Authorization (EUA). This EUA will remain in effect (meaning this test can be used) for the duration of the COVID-19 declaration under Section 564(b)(1) of the Act, 21 U.S.C. section 360bbb-3(b)(1), unless the authorization is terminated or revoked.  Performed at [facility], [place] [address], [place], [place] [address]   SARS CORONAVIRUS 2 (TAT 6-24 HRS) Nasopharyngeal Nasopharyngeal Swab     Status: None   Collection Time: 05/01/20  4:48 PM   Specimen: Nasopharyngeal Swab  Result Value Ref Range Status   SARS Coronavirus 2 NEGATIVE NEGATIVE Final    Comment: (NOTE) SARS-CoV-2 target nucleic acids are NOT DETECTED.  The SARS-CoV-2 RNA is generally detectable in upper and lower respiratory specimens during the acute phase of infection. Negative results do not preclude SARS-CoV-2 infection, do not rule out co-infections with other pathogens, and should not be used as the sole basis for treatment or other patient management decisions. Negative results must be  combined with clinical observations, patient history, and epidemiological information. The expected result is Negative.  Fact Sheet for Patients: SugarRoll.be  Fact Sheet for Healthcare Providers: https://www.woods-mathews.com/  This test is not yet approved or cleared by the [place] FDA and  has been authorized for detection and/or diagnosis of SARS-CoV-2 by FDA under an Emergency Use Authorization (EUA). This EUA will remain  in effect (meaning this test can be used) for the duration of the COVID-19 declaration under Se ction 564(b)(1) of the Act, 21 U.S.C. section 360bbb-3(b)(1), unless the authorization is terminated or revoked sooner.  Performed at [facility], [place] [address], [place], [place] [address]      Labs: BNP (last 3 results) No results for input(s): BNP in the last 8760 hours. Basic Metabolic Panel: Recent Labs  Lab 04/26/20 1228 04/27/20 0802 04/28/20 0451 05/01/20 0319  NA 134* 133* 136 138  K 4.1 4.7 4.1 4.5  CL 103 103 105 107  CO2 23 20* 21* 22  GLUCOSE 238* 278* 121* 192*  BUN 32* 38* 36* 33*  CREATININE 2.30* 2.73* 2.54* 2.26*  CALCIUM 9.1 9.1 9.2 9.4  MG 2.0 2.0 2.1  --    Liver Function Tests: No results for input(s): AST, ALT, ALKPHOS, BILITOT, PROT, ALBUMIN in the last 168 hours. No results for input(s): LIPASE, AMYLASE in the last 168 hours. No results for input(s): AMMONIA in the last 168 hours. CBC: Recent Labs  Lab 04/26/20 1228 04/27/20 0802 04/28/20 0451 05/01/20 0319  WBC 5.2 5.4 6.0 6.1  NEUTROABS 2.5  --   --   --   HGB 10.7* 10.8* 10.4* 10.5*  HCT 34.3* 33.9* 32.5* 34.5*  MCV 84.5 85.2 85.3 85.8  PLT 257 241 243 256   Cardiac Enzymes: No results for input(s): CKTOTAL, CKMB, CKMBINDEX, TROPONINI in the last 168 hours. BNP: Invalid input(s): POCBNP CBG: Recent Labs  Lab 05/02/20 0653 05/02/20 1316 05/02/20 1822 05/02/20 2104 05/03/20 0605  GLUCAP 175* 157*  153* 145* 136*   D-Dimer No results for input(s): DDIMER in the last 72 hours. Hgb A1c No results for input(s): HGBA1C in the last 72 hours. Lipid Profile No results for input(s): CHOL, HDL, LDLCALC, TRIG, CHOLHDL, LDLDIRECT in the last 72 hours. Thyroid function studies No results for input(s): TSH, T4TOTAL, T3FREE, THYROIDAB in the last 72 hours.  Invalid input(s): FREET3 Anemia work up No results for input(s): VITAMINB12, FOLATE, FERRITIN, TIBC, IRON, RETICCTPCT in the last 72 hours. Urinalysis    Component Value Date/Time   COLORURINE YELLOW 04/25/2020 0755   APPEARANCEUR HAZY (A) 04/25/2020 0755   LABSPEC 1.014 04/25/2020 0755   PHURINE 5.0 04/25/2020 0755   GLUCOSEU >=500 (A) 04/25/2020 0755   HGBUR SMALL (A) 04/25/2020 0755   BILIRUBINUR NEGATIVE 04/25/2020 0755   BILIRUBINUR negative  09/07/2018 [place] 04/25/2020 0755   PROTEINUR >=300 (A) 04/25/2020 0755   UROBILINOGEN 1.0 09/07/2018 1034   UROBILINOGEN 1.0 01/01/2017 1032   NITRITE NEGATIVE 04/25/2020 0755   LEUKOCYTESUR NEGATIVE 04/25/2020 0755   Sepsis Labs Invalid input(s): PROCALCITONIN,  WBC,  LACTICIDVEN Microbiology Recent Results (from the past 240 hour(s))  Resp Panel by RT-PCR (Flu A&B, Covid) Nasopharyngeal Swab     Status: None   Collection Time: 04/25/20  4:35 AM   Specimen: Nasopharyngeal Swab; Nasopharyngeal(NP) swabs in vial transport medium  Result Value Ref Range Status   SARS Coronavirus 2 by RT PCR NEGATIVE NEGATIVE Final    Comment: (NOTE) SARS-CoV-2 target nucleic acids are NOT DETECTED.  The SARS-CoV-2 RNA is generally detectable in upper respiratory specimens during the acute phase of infection. The lowest concentration of SARS-CoV-2 viral copies this assay can detect is 138 copies/mL. A negative result does not preclude SARS-Cov-2 infection and should not be used as the sole basis for treatment or other patient management decisions. A negative result may occur with   improper specimen collection/handling, submission of specimen other than nasopharyngeal swab, presence of viral mutation(s) within the areas targeted by this assay, and inadequate number of viral copies(<138 copies/mL). A negative result must be combined with clinical observations, patient history, and epidemiological information. The expected result is Negative.  Fact Sheet for Patients:  EntrepreneurPulse.com.au  Fact Sheet for Healthcare Providers:  IncredibleEmployment.be  This test is no t yet approved or cleared by the [place] FDA and  has been authorized for detection and/or diagnosis of SARS-CoV-2 by FDA under an Emergency Use Authorization (EUA). This EUA will remain  in effect (meaning this test can be used) for the duration of the COVID-19 declaration under Section 564(b)(1) of the Act, 21 U.S.C.section 360bbb-3(b)(1), unless the authorization is terminated  or revoked sooner.       Influenza A by PCR NEGATIVE NEGATIVE Final   Influenza B by PCR NEGATIVE NEGATIVE Final    Comment: (NOTE) The Xpert Xpress SARS-CoV-2/FLU/RSV plus assay is intended as an aid in the diagnosis of influenza from Nasopharyngeal swab specimens and should not be used as a sole basis for treatment. Nasal washings and aspirates are unacceptable for Xpert Xpress SARS-CoV-2/FLU/RSV testing.  Fact Sheet for Patients: EntrepreneurPulse.com.au  Fact Sheet for Healthcare Providers: IncredibleEmployment.be  This test is not yet approved or cleared by the [place] FDA and has been authorized for detection and/or diagnosis of SARS-CoV-2 by FDA under an Emergency Use Authorization (EUA). This EUA will remain in effect (meaning this test can be used) for the duration of the COVID-19 declaration under Section 564(b)(1) of the Act, 21 U.S.C. section 360bbb-3(b)(1), unless the authorization is terminated  or revoked.  Performed at [facility], [place] [address] [place], [place] [address]   SARS CORONAVIRUS 2 (TAT 6-24 HRS) Nasopharyngeal Nasopharyngeal Swab     Status: None   Collection Time: 05/01/20  4:48 PM   Specimen: Nasopharyngeal Swab  Result Value Ref Range Status   SARS Coronavirus 2 NEGATIVE NEGATIVE Final    Comment: (NOTE) SARS-CoV-2 target nucleic acids are NOT DETECTED.  The SARS-CoV-2 RNA is generally detectable in upper and lower respiratory specimens during the acute phase of infection. Negative results do not preclude SARS-CoV-2 infection, do not rule out co-infections with other pathogens, and should not be used as the sole basis for treatment or other patient management decisions. Negative results must be combined with clinical observations, patient history,  and epidemiological information. The expected result is Negative.  Fact Sheet for Patients: SugarRoll.be  Fact Sheet for Healthcare Providers: https://www.woods-mathews.com/  This test is not yet approved or cleared by the [place] FDA and  has been authorized for detection and/or diagnosis of SARS-CoV-2 by FDA under an Emergency Use Authorization (EUA). This EUA will remain  in effect (meaning this test can be used) for the duration of the COVID-19 declaration under Se ction 564(b)(1) of the Act, 21 U.S.C. section 360bbb-3(b)(1), unless the authorization is terminated or revoked sooner.  Performed at [facility], [place] [address] [place], [place] [address]      Time coordinating discharge in minutes: 65  SIGNED:   Debbe Odea, MD  [facility] 05/03/2020, 10:36 AM

## 2020-05-03 NOTE — TOC Transition Note (Signed)
Transition of Care [facility]) - CM/SW Discharge Note   Patient Details  Name: Keith [name] MRN: [name] Date of Birth: 07-19-1965  Transition of Care [facility]) CM/SW Contact:  Marney Setting, [place] Work Phone Number: 05/03/2020, 11:17 AM   Clinical Narrative:   Nurse to call report to [phone]. Rm. [phone]    Final next level of care: Skilled Nursing Facility Barriers to Discharge: No Barriers Identified   Patient Goals and CMS Choice   CMS Medicare.gov Compare Post Acute Care list provided to:: Patient Choice offered to / list presented to : Patient  Discharge Placement              Patient chooses bed at:  [facility]) Patient to be transferred to facility by: [facility] Name of family member notified: [name] Patient and family notified of of transfer: 05/03/20  Discharge Plan and Services In-house Referral: Clinical Social Work Discharge Planning Services: CM Consult Post Acute Care Choice: [place]                               Social Determinants of Health (SDOH) Interventions     Readmission Risk Interventions Readmission Risk Prevention Plan 11/13/2018 11/13/2018 11/03/2018  Transportation Screening - Complete Complete  PCP or Specialist Appt within 3-5 Days - - Complete  [place] or [place] - - Complete  Social Work Consult for [place] Planning/Counseling - - Complete  Palliative Care Screening - - Not Applicable  Medication Review Press photographer) - Complete Complete  PCP or Specialist appointment within 3-5 days of discharge Complete - -  [place] or Home Care Consult Complete - -  SW Recovery Care/Counseling Consult Complete - -  Palliative Care Screening Not Applicable - -  [place] Not Applicable - -  Some recent data might be hidden

## 2020-05-03 NOTE — Progress Notes (Signed)
  Speech Language Pathology Treatment: Cognitive-Linquistic  Patient Details Name: Keith [name] MRN: [name] DOB: 1966/01/17 Today's Date: 05/03/2020 Time: [id number] SLP Time Calculation (min) (ACUTE ONLY): 16 min  Assessment / Plan / Recommendation Clinical Impression  Mr Butterly pleasantly participated in skilled treatment for dysarthria and cognition. He feels that his speech is at baseline and therapist provided feedback that he is intelligible but distortions are present and noisy environments may pose challenging for listeners. Introduced strategies for increasing clarity and he was able to recall 1 of 3 after 14 min delay and moderate cues to implement during conversation.  He needed moderate cues to state solutions to basic problem solving hospital situations re: safety and needs. Discussion/education/strategies provided for prospective memory (upcoming events) and organization. Plans are to discharge to SNF where it is recommended he continue therapy.    HPI HPI: Pt presents with L sided weakness, facial droop, and slurred speech.  CT neg. MRI suspicious for CVA R pons. On 1/20, patient with worsening L-sided weakness. Code stroke called. Work-up pending. PMHx significant for DMII, CAD, HIV, rt BKA, CKD, back pain, HTN, diabetic neuropathy, and STEMI s/p stent one mo ago.      SLP Plan  Continue with current plan of care       Recommendations                   Oral Care Recommendations: Oral care BID Follow up Recommendations: Skilled Nursing facility SLP Visit Diagnosis: Dysarthria and anarthria (R47.1);Cognitive communication deficit [id number]) Plan: Continue with current plan of care       GO                Houston Siren 05/03/2020, 10:25 AM   Orbie Pyo Colvin Caroli.Ed Risk analyst [phone] Office [phone]

## 2020-05-04 DIAGNOSIS — I2510 Atherosclerotic heart disease of native coronary artery without angina pectoris: Secondary | ICD-10-CM | POA: Diagnosis not present

## 2020-05-04 DIAGNOSIS — N184 Chronic kidney disease, stage 4 (severe): Secondary | ICD-10-CM | POA: Diagnosis not present

## 2020-05-04 DIAGNOSIS — E11621 Type 2 diabetes mellitus with foot ulcer: Secondary | ICD-10-CM | POA: Diagnosis not present

## 2020-05-04 DIAGNOSIS — I639 Cerebral infarction, unspecified: Secondary | ICD-10-CM | POA: Diagnosis not present

## 2020-05-05 DIAGNOSIS — I10 Essential (primary) hypertension: Secondary | ICD-10-CM | POA: Diagnosis not present

## 2020-05-05 DIAGNOSIS — R5381 Other malaise: Secondary | ICD-10-CM | POA: Diagnosis not present

## 2020-05-05 DIAGNOSIS — I2510 Atherosclerotic heart disease of native coronary artery without angina pectoris: Secondary | ICD-10-CM | POA: Diagnosis not present

## 2020-05-05 DIAGNOSIS — E119 Type 2 diabetes mellitus without complications: Secondary | ICD-10-CM | POA: Diagnosis not present

## 2020-05-16 ENCOUNTER — Other Ambulatory Visit: Payer: Self-pay | Admitting: Infectious Diseases

## 2020-05-16 ENCOUNTER — Telehealth: Payer: Self-pay

## 2020-05-16 DIAGNOSIS — M129 Arthropathy, unspecified: Secondary | ICD-10-CM | POA: Diagnosis not present

## 2020-05-16 DIAGNOSIS — G546 Phantom limb syndrome with pain: Secondary | ICD-10-CM | POA: Diagnosis not present

## 2020-05-16 DIAGNOSIS — G894 Chronic pain syndrome: Secondary | ICD-10-CM | POA: Diagnosis not present

## 2020-05-16 DIAGNOSIS — E559 Vitamin D deficiency, unspecified: Secondary | ICD-10-CM | POA: Diagnosis not present

## 2020-05-16 DIAGNOSIS — Z79899 Other long term (current) drug therapy: Secondary | ICD-10-CM | POA: Diagnosis not present

## 2020-05-16 DIAGNOSIS — B20 Human immunodeficiency virus [HIV] disease: Secondary | ICD-10-CM

## 2020-05-16 NOTE — Telephone Encounter (Signed)
I called patient to schedule him for a follow up visit with Dr. [name]. Patient is past due for a follow up visit. Patient did not answer and a message on a secured voicemail was left for patient to call back to schedule.  Keith [name] T Brooks Sailors

## 2020-05-17 ENCOUNTER — Telehealth: Payer: Self-pay

## 2020-05-17 NOTE — Telephone Encounter (Signed)
Received call from [facility] at [facility] to schedule hospital follow up for patient. Patient is currently at [facility] and has not received his Biktarvy for the last few days because the facility "ran out." RN scheduled patient to see Dr. [name] 05/19/20, patient states facility will arrange his transportation.   Beryle Flock, RN

## 2020-05-17 NOTE — Telephone Encounter (Signed)
Thank you, I really appreciate it Keith [name]

## 2020-05-19 ENCOUNTER — Ambulatory Visit (INDEPENDENT_AMBULATORY_CARE_PROVIDER_SITE_OTHER): Payer: Medicare HMO | Admitting: Infectious Diseases

## 2020-05-19 ENCOUNTER — Encounter: Payer: Self-pay | Admitting: Infectious Diseases

## 2020-05-19 ENCOUNTER — Other Ambulatory Visit: Payer: Self-pay

## 2020-05-19 VITALS — BP 129/80 | HR 72 | Temp 97.8°F

## 2020-05-19 DIAGNOSIS — B20 Human immunodeficiency virus [HIV] disease: Secondary | ICD-10-CM | POA: Diagnosis not present

## 2020-05-19 DIAGNOSIS — Z113 Encounter for screening for infections with a predominantly sexual mode of transmission: Secondary | ICD-10-CM

## 2020-05-19 DIAGNOSIS — T8740 Infection of amputation stump, unspecified extremity: Secondary | ICD-10-CM

## 2020-05-19 DIAGNOSIS — E785 Hyperlipidemia, unspecified: Secondary | ICD-10-CM | POA: Diagnosis not present

## 2020-05-19 DIAGNOSIS — I10 Essential (primary) hypertension: Secondary | ICD-10-CM

## 2020-05-19 DIAGNOSIS — E1142 Type 2 diabetes mellitus with diabetic polyneuropathy: Secondary | ICD-10-CM | POA: Diagnosis not present

## 2020-05-19 DIAGNOSIS — Z89431 Acquired absence of right foot: Secondary | ICD-10-CM | POA: Diagnosis not present

## 2020-05-19 DIAGNOSIS — E782 Mixed hyperlipidemia: Secondary | ICD-10-CM

## 2020-05-19 DIAGNOSIS — R69 Illness, unspecified: Secondary | ICD-10-CM | POA: Diagnosis not present

## 2020-05-19 DIAGNOSIS — Z8673 Personal history of transient ischemic attack (TIA), and cerebral infarction without residual deficits: Secondary | ICD-10-CM | POA: Diagnosis not present

## 2020-05-19 DIAGNOSIS — I639 Cerebral infarction, unspecified: Secondary | ICD-10-CM

## 2020-05-19 DIAGNOSIS — I2121 ST elevation (STEMI) myocardial infarction involving left circumflex coronary artery: Secondary | ICD-10-CM

## 2020-05-19 DIAGNOSIS — K009 Disorder of tooth development, unspecified: Secondary | ICD-10-CM

## 2020-05-19 MED ORDER — BIKTARVY 50-200-25 MG PO TABS: 1.0000 | ORAL_TABLET | Freq: Every day | ORAL | 3 refills | Status: DC

## 2020-05-19 NOTE — Assessment & Plan Note (Signed)
currently asx Appreciate CV following and adjusting meds as needed.

## 2020-05-19 NOTE — Assessment & Plan Note (Signed)
Will check his labs today Flu shot today Dental referral placed today for [facility]. Information to schedule appointment completed today.  His ART is refilled Flu shot today He needs colon.  Will get him out from his CVA and MI prior to risking anesthesia.  rtc in 6 months

## 2020-05-19 NOTE — Addendum Note (Signed)
Addended by: Caffie Pinto on: 05/19/2020 11:50 AM   Modules accepted: Orders

## 2020-05-19 NOTE — Assessment & Plan Note (Signed)
Will controlled today Appreciate CV and PCP f/u

## 2020-05-19 NOTE — Assessment & Plan Note (Signed)
Continue lipitor Check lipids today and lfts.

## 2020-05-19 NOTE — Assessment & Plan Note (Signed)
Dental referral placed today for [facility]. Information to schedule appointment completed today.   

## 2020-05-19 NOTE — Assessment & Plan Note (Signed)
He believes he is improving.  Will check his lipids today greatly appreciate his rehab center.

## 2020-05-19 NOTE — Assessment & Plan Note (Signed)
Appears to be resolved on clinical exam.  Will continue to watch.

## 2020-05-19 NOTE — Assessment & Plan Note (Addendum)
Will check his A1C today He states he has good control at SNF Will send to ophtho for yearly eval

## 2020-05-19 NOTE — Progress Notes (Signed)
Subjective:    [name], male  DOB: 10-Sep-1965, 55 y.o.        MRN: [id number]   HPI 54y.o.MHIV+, uncontrolledDM2, HTN, stage III CKD, and chronic right foot osteomyelitis who presented to the ED 1/21/19with worsening distal plantar pain and fever/chills concerning for worsening wound infection/osteomyelitis despite outpatient augmentin. He hadright trans-met amputation1/25/2019. Blood cultures grew MRSA PICC line was placed 1/29 andhe completedvancomycin to2/7.  On 06-13-17 he underwent trans-tibial amputation after developing an abscess and wound dehisc in his R foot. He was in hospital 08-07-2018 with HHS and AKI (CBG 395 and Anion Gap 11).  He is currently on prezcobix and triumeq (he had genotype 05-2018 showing background mutations, geno 11-2009 showing no mutations, 2010 M184V).   He was switched to biktarvy 2021.   Since he has been in hospital with acute MI (11-2018), infection of R BKA stump (10-2018).  He was again hospitalized 03-21-20 with a STEMI and had L circ stent. His A1C was > 15.5%.  He returned to hospital 04-25-20 to 05-03-20 with L sided weakness. He was found on MRI to have R pons infarct.  He was continued on his brilanta, ASA, lipitor. His diabetic regimen was adjusted and his BP was felt to be well controlled on carvedilol.  He was d/c to SNF/rehab. [facility].   He feels like his strength has been improving.  He is concerned that his prosthesis needs to be refitted.  States his FSG have been 125-135. Low of 68 once.   Believes he has been off his biktarvy for a couple of days, asks for refills. States he has diarrhea when he is off.   HIV 1 RNA Quant (copies/mL)  Date Value  09/01/2019 7,280 (H)  09/17/2017 <20 NOT DETECTED  01/14/2017 <20   CD4 T Cell Abs (/uL)  Date Value  09/01/2019 89 (L)  06/06/2018 140 (L)  04/28/2017 80 (L)     Health Maintenance  Topic Date Due  . COVID-19 Vaccine (1) Never done  . COLONOSCOPY  (Pts 45-39yr Insurance coverage will need to be confirmed)  Never done  . OPHTHALMOLOGY EXAM  06/23/2012  . FOOT EXAM  02/23/2015  . URINE MICROALBUMIN  02/23/2015  . INFLUENZA VACCINE  11/07/2019  . HEMOGLOBIN A1C  06/22/2020  . LIPID PANEL  04/25/2021  . TETANUS/TDAP  05/30/2024  . PNEUMOCOCCAL POLYSACCHARIDE VACCINE AGE 34-64 HIGH RISK  Completed  . Hepatitis C Screening  Completed  . HIV Screening  Completed   Wants flu shot today, has gotten COVID shot   Review of Systems  Constitutional: Negative for chills and fever.  HENT:       No dysphagia.  Would like better food from SNF  Eyes: Negative for blurred vision and double vision.  Respiratory: Negative for cough and shortness of breath.   Gastrointestinal: Negative for constipation and diarrhea.  Genitourinary: Negative for dysuria.  Neurological: Positive for focal weakness. Negative for dizziness and sensory change.    Please see HPI. All other systems reviewed and negative.     Objective:  Physical Exam Vitals reviewed.  Constitutional:      Appearance: He is obese.  HENT:     Mouth/Throat:     Mouth: Mucous membranes are moist.     Pharynx: No oropharyngeal exudate.  Eyes:     Extraocular Movements: Extraocular movements intact.     Pupils: Pupils are equal, round, and reactive to light.  Cardiovascular:     Rate and  Rhythm: Normal rate and regular rhythm.  Pulmonary:     Effort: Pulmonary effort is normal.     Breath sounds: Normal breath sounds.  Abdominal:     General: Bowel sounds are normal. There is no distension.     Palpations: Abdomen is soft.     Tenderness: There is no abdominal tenderness.  Musculoskeletal:     Cervical back: Normal range of motion and neck supple.     Left lower leg: Edema present.     Comments: RLE stump is clean, well healed. Non-tender, no edema.   Neurological:     Mental Status: He is alert.     Motor: Weakness (he moves his L leg slowly, with some weakness. his LUE  is 5/5, RUE 5/5. ) present.            Assessment & Plan:

## 2020-05-30 LAB — COMPREHENSIVE METABOLIC PANEL
AG Ratio: 1.1 (calc) (ref 1.0–2.5)
ALT: 15 U/L (ref 9–46)
AST: 21 U/L (ref 10–35)
Albumin: 3.6 g/dL (ref 3.6–5.1)
Alkaline phosphatase (APISO): 69 U/L (ref 35–144)
BUN/Creatinine Ratio: 15 (calc) (ref 6–22)
BUN: 26 mg/dL — ABNORMAL HIGH (ref 7–25)
CO2: 22 mmol/L (ref 20–32)
Calcium: 8.8 mg/dL (ref 8.6–10.3)
Chloride: 108 mmol/L (ref 98–110)
Creat: 1.77 mg/dL — ABNORMAL HIGH (ref 0.70–1.33)
Globulin: 3.4 g/dL (calc) (ref 1.9–3.7)
Glucose, Bld: 130 mg/dL — ABNORMAL HIGH (ref 65–99)
Potassium: 4.3 mmol/L (ref 3.5–5.3)
Sodium: 138 mmol/L (ref 135–146)
Total Bilirubin: 0.4 mg/dL (ref 0.2–1.2)
Total Protein: 7 g/dL (ref 6.1–8.1)

## 2020-05-30 LAB — T-HELPER CELLS (CD4) COUNT (NOT AT ARMC)
Absolute CD4: 109 cells/uL — ABNORMAL LOW (ref 490–1740)
CD4 T Helper %: 6 % — ABNORMAL LOW (ref 30–61)
Total lymphocyte count: 1933 cells/uL (ref 850–3900)

## 2020-05-30 LAB — HIV-1 RNA ULTRAQUANT REFLEX TO GENTYP+
HIV 1 RNA Quant: 1780 copies/mL — ABNORMAL HIGH
HIV-1 RNA Quant, Log: 3.25 Log copies/mL — ABNORMAL HIGH

## 2020-05-30 LAB — HIV-1 INTEGRASE GENOTYPE

## 2020-05-30 LAB — HEMOGLOBIN A1C
Hgb A1c MFr Bld: 10.6 % of total Hgb — ABNORMAL HIGH (ref <5.7)
Mean Plasma Glucose: 258 mg/dL
eAG (mmol/L): 14.3 mmol/L

## 2020-05-30 LAB — CBC
HCT: 29.1 % — ABNORMAL LOW (ref 38.5–50.0)
Hemoglobin: 9.5 g/dL — ABNORMAL LOW (ref 13.2–17.1)
MCH: 27.3 pg (ref 27.0–33.0)
MCHC: 32.6 g/dL (ref 32.0–36.0)
MCV: 83.6 fL (ref 80.0–100.0)
MPV: 10 fL (ref 7.5–12.5)
Platelets: 217 10*3/uL (ref 140–400)
RBC: 3.48 10*6/uL — ABNORMAL LOW (ref 4.20–5.80)
RDW: 14.5 % (ref 11.0–15.0)
WBC: 7.3 10*3/uL (ref 3.8–10.8)

## 2020-05-30 LAB — HIV-1 GENOTYPE: HIV-1 Genotype: DETECTED — AB

## 2020-05-30 LAB — RPR: RPR Ser Ql: NONREACTIVE

## 2020-06-02 DIAGNOSIS — I2510 Atherosclerotic heart disease of native coronary artery without angina pectoris: Secondary | ICD-10-CM | POA: Diagnosis not present

## 2020-06-02 DIAGNOSIS — E119 Type 2 diabetes mellitus without complications: Secondary | ICD-10-CM | POA: Diagnosis not present

## 2020-06-02 DIAGNOSIS — I10 Essential (primary) hypertension: Secondary | ICD-10-CM | POA: Diagnosis not present

## 2020-06-02 DIAGNOSIS — R5381 Other malaise: Secondary | ICD-10-CM | POA: Diagnosis not present

## 2020-06-09 DIAGNOSIS — M129 Arthropathy, unspecified: Secondary | ICD-10-CM | POA: Diagnosis not present

## 2020-06-09 DIAGNOSIS — E559 Vitamin D deficiency, unspecified: Secondary | ICD-10-CM | POA: Diagnosis not present

## 2020-06-09 DIAGNOSIS — G894 Chronic pain syndrome: Secondary | ICD-10-CM | POA: Diagnosis not present

## 2020-06-09 DIAGNOSIS — G546 Phantom limb syndrome with pain: Secondary | ICD-10-CM | POA: Diagnosis not present

## 2020-06-12 DIAGNOSIS — I69354 Hemiplegia and hemiparesis following cerebral infarction affecting left non-dominant side: Secondary | ICD-10-CM | POA: Diagnosis not present

## 2020-06-12 DIAGNOSIS — R278 Other lack of coordination: Secondary | ICD-10-CM | POA: Diagnosis not present

## 2020-06-12 DIAGNOSIS — M6281 Muscle weakness (generalized): Secondary | ICD-10-CM | POA: Diagnosis not present

## 2020-06-12 DIAGNOSIS — E118 Type 2 diabetes mellitus with unspecified complications: Secondary | ICD-10-CM | POA: Diagnosis not present

## 2020-06-12 DIAGNOSIS — I6930 Unspecified sequelae of cerebral infarction: Secondary | ICD-10-CM | POA: Diagnosis not present

## 2020-06-12 DIAGNOSIS — N184 Chronic kidney disease, stage 4 (severe): Secondary | ICD-10-CM | POA: Diagnosis not present

## 2020-06-12 DIAGNOSIS — R2689 Other abnormalities of gait and mobility: Secondary | ICD-10-CM | POA: Diagnosis not present

## 2020-06-12 DIAGNOSIS — R2681 Unsteadiness on feet: Secondary | ICD-10-CM | POA: Diagnosis not present

## 2020-06-13 DIAGNOSIS — R2681 Unsteadiness on feet: Secondary | ICD-10-CM | POA: Diagnosis not present

## 2020-06-13 DIAGNOSIS — M6281 Muscle weakness (generalized): Secondary | ICD-10-CM | POA: Diagnosis not present

## 2020-06-13 DIAGNOSIS — I6930 Unspecified sequelae of cerebral infarction: Secondary | ICD-10-CM | POA: Diagnosis not present

## 2020-06-13 DIAGNOSIS — E118 Type 2 diabetes mellitus with unspecified complications: Secondary | ICD-10-CM | POA: Diagnosis not present

## 2020-06-13 DIAGNOSIS — R278 Other lack of coordination: Secondary | ICD-10-CM | POA: Diagnosis not present

## 2020-06-13 DIAGNOSIS — R2689 Other abnormalities of gait and mobility: Secondary | ICD-10-CM | POA: Diagnosis not present

## 2020-06-13 DIAGNOSIS — I69354 Hemiplegia and hemiparesis following cerebral infarction affecting left non-dominant side: Secondary | ICD-10-CM | POA: Diagnosis not present

## 2020-06-14 DIAGNOSIS — E118 Type 2 diabetes mellitus with unspecified complications: Secondary | ICD-10-CM | POA: Diagnosis not present

## 2020-06-14 DIAGNOSIS — M6281 Muscle weakness (generalized): Secondary | ICD-10-CM | POA: Diagnosis not present

## 2020-06-14 DIAGNOSIS — I69354 Hemiplegia and hemiparesis following cerebral infarction affecting left non-dominant side: Secondary | ICD-10-CM | POA: Diagnosis not present

## 2020-06-14 DIAGNOSIS — R2681 Unsteadiness on feet: Secondary | ICD-10-CM | POA: Diagnosis not present

## 2020-06-14 DIAGNOSIS — R2689 Other abnormalities of gait and mobility: Secondary | ICD-10-CM | POA: Diagnosis not present

## 2020-06-14 DIAGNOSIS — I6930 Unspecified sequelae of cerebral infarction: Secondary | ICD-10-CM | POA: Diagnosis not present

## 2020-06-14 DIAGNOSIS — R278 Other lack of coordination: Secondary | ICD-10-CM | POA: Diagnosis not present

## 2020-06-15 DIAGNOSIS — R2681 Unsteadiness on feet: Secondary | ICD-10-CM | POA: Diagnosis not present

## 2020-06-15 DIAGNOSIS — I6930 Unspecified sequelae of cerebral infarction: Secondary | ICD-10-CM | POA: Diagnosis not present

## 2020-06-15 DIAGNOSIS — M6281 Muscle weakness (generalized): Secondary | ICD-10-CM | POA: Diagnosis not present

## 2020-06-15 DIAGNOSIS — E119 Type 2 diabetes mellitus without complications: Secondary | ICD-10-CM | POA: Diagnosis not present

## 2020-06-15 DIAGNOSIS — N184 Chronic kidney disease, stage 4 (severe): Secondary | ICD-10-CM | POA: Diagnosis not present

## 2020-06-15 DIAGNOSIS — I10 Essential (primary) hypertension: Secondary | ICD-10-CM | POA: Diagnosis not present

## 2020-06-15 DIAGNOSIS — R69 Illness, unspecified: Secondary | ICD-10-CM | POA: Diagnosis not present

## 2020-06-15 DIAGNOSIS — E118 Type 2 diabetes mellitus with unspecified complications: Secondary | ICD-10-CM | POA: Diagnosis not present

## 2020-06-15 DIAGNOSIS — I69354 Hemiplegia and hemiparesis following cerebral infarction affecting left non-dominant side: Secondary | ICD-10-CM | POA: Diagnosis not present

## 2020-06-15 DIAGNOSIS — R278 Other lack of coordination: Secondary | ICD-10-CM | POA: Diagnosis not present

## 2020-06-15 DIAGNOSIS — R2689 Other abnormalities of gait and mobility: Secondary | ICD-10-CM | POA: Diagnosis not present

## 2020-06-16 DIAGNOSIS — E118 Type 2 diabetes mellitus with unspecified complications: Secondary | ICD-10-CM | POA: Diagnosis not present

## 2020-06-16 DIAGNOSIS — M6281 Muscle weakness (generalized): Secondary | ICD-10-CM | POA: Diagnosis not present

## 2020-06-16 DIAGNOSIS — I69354 Hemiplegia and hemiparesis following cerebral infarction affecting left non-dominant side: Secondary | ICD-10-CM | POA: Diagnosis not present

## 2020-06-16 DIAGNOSIS — R2681 Unsteadiness on feet: Secondary | ICD-10-CM | POA: Diagnosis not present

## 2020-06-16 DIAGNOSIS — I6930 Unspecified sequelae of cerebral infarction: Secondary | ICD-10-CM | POA: Diagnosis not present

## 2020-06-16 DIAGNOSIS — R278 Other lack of coordination: Secondary | ICD-10-CM | POA: Diagnosis not present

## 2020-06-16 DIAGNOSIS — R2689 Other abnormalities of gait and mobility: Secondary | ICD-10-CM | POA: Diagnosis not present

## 2020-06-18 DIAGNOSIS — M6281 Muscle weakness (generalized): Secondary | ICD-10-CM | POA: Diagnosis not present

## 2020-06-18 DIAGNOSIS — R278 Other lack of coordination: Secondary | ICD-10-CM | POA: Diagnosis not present

## 2020-06-18 DIAGNOSIS — R2689 Other abnormalities of gait and mobility: Secondary | ICD-10-CM | POA: Diagnosis not present

## 2020-06-18 DIAGNOSIS — I69354 Hemiplegia and hemiparesis following cerebral infarction affecting left non-dominant side: Secondary | ICD-10-CM | POA: Diagnosis not present

## 2020-06-18 DIAGNOSIS — E118 Type 2 diabetes mellitus with unspecified complications: Secondary | ICD-10-CM | POA: Diagnosis not present

## 2020-06-18 DIAGNOSIS — I6930 Unspecified sequelae of cerebral infarction: Secondary | ICD-10-CM | POA: Diagnosis not present

## 2020-06-18 DIAGNOSIS — R2681 Unsteadiness on feet: Secondary | ICD-10-CM | POA: Diagnosis not present

## 2020-06-19 DIAGNOSIS — E118 Type 2 diabetes mellitus with unspecified complications: Secondary | ICD-10-CM | POA: Diagnosis not present

## 2020-06-19 DIAGNOSIS — R2689 Other abnormalities of gait and mobility: Secondary | ICD-10-CM | POA: Diagnosis not present

## 2020-06-19 DIAGNOSIS — I6930 Unspecified sequelae of cerebral infarction: Secondary | ICD-10-CM | POA: Diagnosis not present

## 2020-06-19 DIAGNOSIS — I69354 Hemiplegia and hemiparesis following cerebral infarction affecting left non-dominant side: Secondary | ICD-10-CM | POA: Diagnosis not present

## 2020-06-19 DIAGNOSIS — R2681 Unsteadiness on feet: Secondary | ICD-10-CM | POA: Diagnosis not present

## 2020-06-19 DIAGNOSIS — M6281 Muscle weakness (generalized): Secondary | ICD-10-CM | POA: Diagnosis not present

## 2020-06-19 DIAGNOSIS — R278 Other lack of coordination: Secondary | ICD-10-CM | POA: Diagnosis not present

## 2020-06-20 ENCOUNTER — Telehealth: Payer: Self-pay

## 2020-06-20 DIAGNOSIS — I6930 Unspecified sequelae of cerebral infarction: Secondary | ICD-10-CM | POA: Diagnosis not present

## 2020-06-20 DIAGNOSIS — E118 Type 2 diabetes mellitus with unspecified complications: Secondary | ICD-10-CM | POA: Diagnosis not present

## 2020-06-20 DIAGNOSIS — R2681 Unsteadiness on feet: Secondary | ICD-10-CM | POA: Diagnosis not present

## 2020-06-20 DIAGNOSIS — I10 Essential (primary) hypertension: Secondary | ICD-10-CM | POA: Diagnosis not present

## 2020-06-20 DIAGNOSIS — I739 Peripheral vascular disease, unspecified: Secondary | ICD-10-CM | POA: Diagnosis not present

## 2020-06-20 DIAGNOSIS — R2689 Other abnormalities of gait and mobility: Secondary | ICD-10-CM | POA: Diagnosis not present

## 2020-06-20 DIAGNOSIS — I69354 Hemiplegia and hemiparesis following cerebral infarction affecting left non-dominant side: Secondary | ICD-10-CM | POA: Diagnosis not present

## 2020-06-20 DIAGNOSIS — R278 Other lack of coordination: Secondary | ICD-10-CM | POA: Diagnosis not present

## 2020-06-20 DIAGNOSIS — M6281 Muscle weakness (generalized): Secondary | ICD-10-CM | POA: Diagnosis not present

## 2020-06-20 NOTE — Telephone Encounter (Signed)
We received paperwork for pt from [place] I checked patient chart to see if he is our pt and he is not. YL,RMA

## 2020-06-21 DIAGNOSIS — R69 Illness, unspecified: Secondary | ICD-10-CM | POA: Diagnosis not present

## 2020-06-21 DIAGNOSIS — G4700 Insomnia, unspecified: Secondary | ICD-10-CM | POA: Diagnosis not present

## 2020-06-21 DIAGNOSIS — I6930 Unspecified sequelae of cerebral infarction: Secondary | ICD-10-CM | POA: Diagnosis not present

## 2020-06-21 DIAGNOSIS — Z79899 Other long term (current) drug therapy: Secondary | ICD-10-CM | POA: Diagnosis not present

## 2020-06-21 DIAGNOSIS — R2689 Other abnormalities of gait and mobility: Secondary | ICD-10-CM | POA: Diagnosis not present

## 2020-06-21 DIAGNOSIS — F411 Generalized anxiety disorder: Secondary | ICD-10-CM | POA: Diagnosis not present

## 2020-06-28 ENCOUNTER — Telehealth: Payer: Self-pay

## 2020-06-28 DIAGNOSIS — Z89511 Acquired absence of right leg below knee: Secondary | ICD-10-CM | POA: Diagnosis not present

## 2020-06-28 DIAGNOSIS — I2510 Atherosclerotic heart disease of native coronary artery without angina pectoris: Secondary | ICD-10-CM | POA: Diagnosis not present

## 2020-06-28 DIAGNOSIS — I69392 Facial weakness following cerebral infarction: Secondary | ICD-10-CM | POA: Diagnosis not present

## 2020-06-28 DIAGNOSIS — Z794 Long term (current) use of insulin: Secondary | ICD-10-CM | POA: Diagnosis not present

## 2020-06-28 DIAGNOSIS — Z7902 Long term (current) use of antithrombotics/antiplatelets: Secondary | ICD-10-CM | POA: Diagnosis not present

## 2020-06-28 DIAGNOSIS — D631 Anemia in chronic kidney disease: Secondary | ICD-10-CM | POA: Diagnosis not present

## 2020-06-28 DIAGNOSIS — E785 Hyperlipidemia, unspecified: Secondary | ICD-10-CM | POA: Diagnosis not present

## 2020-06-28 DIAGNOSIS — I639 Cerebral infarction, unspecified: Secondary | ICD-10-CM | POA: Diagnosis not present

## 2020-06-28 DIAGNOSIS — I129 Hypertensive chronic kidney disease with stage 1 through stage 4 chronic kidney disease, or unspecified chronic kidney disease: Secondary | ICD-10-CM | POA: Diagnosis not present

## 2020-06-28 DIAGNOSIS — Z7982 Long term (current) use of aspirin: Secondary | ICD-10-CM | POA: Diagnosis not present

## 2020-06-28 DIAGNOSIS — I69322 Dysarthria following cerebral infarction: Secondary | ICD-10-CM | POA: Diagnosis not present

## 2020-06-28 DIAGNOSIS — N529 Male erectile dysfunction, unspecified: Secondary | ICD-10-CM | POA: Diagnosis not present

## 2020-06-28 DIAGNOSIS — E1122 Type 2 diabetes mellitus with diabetic chronic kidney disease: Secondary | ICD-10-CM | POA: Diagnosis not present

## 2020-06-28 DIAGNOSIS — E1151 Type 2 diabetes mellitus with diabetic peripheral angiopathy without gangrene: Secondary | ICD-10-CM | POA: Diagnosis not present

## 2020-06-28 DIAGNOSIS — N184 Chronic kidney disease, stage 4 (severe): Secondary | ICD-10-CM | POA: Diagnosis not present

## 2020-06-28 DIAGNOSIS — I69354 Hemiplegia and hemiparesis following cerebral infarction affecting left non-dominant side: Secondary | ICD-10-CM | POA: Diagnosis not present

## 2020-06-28 DIAGNOSIS — E1169 Type 2 diabetes mellitus with other specified complication: Secondary | ICD-10-CM | POA: Diagnosis not present

## 2020-06-28 DIAGNOSIS — G8929 Other chronic pain: Secondary | ICD-10-CM | POA: Diagnosis not present

## 2020-06-28 NOTE — Telephone Encounter (Signed)
Verbal order for home health physical therapy frequency 1 time a week 2 times a week 1 time a week for 4 weeks [phone] Annabelle Harman

## 2020-06-28 NOTE — Telephone Encounter (Signed)
Need verbal order for Occupational health [phone] [facility]

## 2020-06-28 NOTE — Telephone Encounter (Signed)
Patient had a stroke in January.  Recently sent home from rehab and would like verbal for occupational health with [facility].

## 2020-07-03 NOTE — Telephone Encounter (Signed)
Verbal given for occupational therapy orders.

## 2020-07-05 ENCOUNTER — Telehealth: Payer: Self-pay

## 2020-07-05 ENCOUNTER — Ambulatory Visit: Payer: Medicare HMO | Admitting: Family Medicine

## 2020-07-05 NOTE — Telephone Encounter (Signed)
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 Paperwork is up front

## 2020-07-06 NOTE — Telephone Encounter (Signed)
error 

## 2020-07-10 ENCOUNTER — Ambulatory Visit (INDEPENDENT_AMBULATORY_CARE_PROVIDER_SITE_OTHER): Payer: Medicare HMO | Admitting: Family Medicine

## 2020-07-10 ENCOUNTER — Encounter: Payer: Self-pay | Admitting: Family Medicine

## 2020-07-10 ENCOUNTER — Other Ambulatory Visit: Payer: Self-pay

## 2020-07-10 VITALS — BP 145/81 | HR 75 | Ht 69.0 in | Wt 292.0 lb

## 2020-07-10 DIAGNOSIS — Z794 Long term (current) use of insulin: Secondary | ICD-10-CM | POA: Diagnosis not present

## 2020-07-10 DIAGNOSIS — Z09 Encounter for follow-up examination after completed treatment for conditions other than malignant neoplasm: Secondary | ICD-10-CM

## 2020-07-10 DIAGNOSIS — R69 Illness, unspecified: Secondary | ICD-10-CM | POA: Diagnosis not present

## 2020-07-10 DIAGNOSIS — E1165 Type 2 diabetes mellitus with hyperglycemia: Secondary | ICD-10-CM | POA: Diagnosis not present

## 2020-07-10 DIAGNOSIS — Z89511 Acquired absence of right leg below knee: Secondary | ICD-10-CM

## 2020-07-10 DIAGNOSIS — L97329 Non-pressure chronic ulcer of left ankle with unspecified severity: Secondary | ICD-10-CM | POA: Diagnosis not present

## 2020-07-10 DIAGNOSIS — E1169 Type 2 diabetes mellitus with other specified complication: Secondary | ICD-10-CM | POA: Diagnosis not present

## 2020-07-10 DIAGNOSIS — G894 Chronic pain syndrome: Secondary | ICD-10-CM | POA: Diagnosis not present

## 2020-07-10 DIAGNOSIS — Z79899 Other long term (current) drug therapy: Secondary | ICD-10-CM | POA: Diagnosis not present

## 2020-07-10 DIAGNOSIS — F419 Anxiety disorder, unspecified: Secondary | ICD-10-CM

## 2020-07-10 DIAGNOSIS — R0989 Other specified symptoms and signs involving the circulatory and respiratory systems: Secondary | ICD-10-CM | POA: Diagnosis not present

## 2020-07-10 DIAGNOSIS — B20 Human immunodeficiency virus [HIV] disease: Secondary | ICD-10-CM

## 2020-07-10 DIAGNOSIS — R7309 Other abnormal glucose: Secondary | ICD-10-CM | POA: Diagnosis not present

## 2020-07-10 DIAGNOSIS — R739 Hyperglycemia, unspecified: Secondary | ICD-10-CM | POA: Diagnosis not present

## 2020-07-10 DIAGNOSIS — G546 Phantom limb syndrome with pain: Secondary | ICD-10-CM | POA: Diagnosis not present

## 2020-07-10 MED ORDER — BLOOD GLUCOSE METER KIT: PACK | 0 refills | Status: DC

## 2020-07-10 NOTE — Progress Notes (Signed)
Patient [facility] Follow Up   Subjective:  [name], male    DOB: Apr 15, 1965  Age: 55 y.o. MRN: [id number]  CC:  Chief Complaint  Patient presents with  . Hypertension    HPI [name] is a 55 year old male who presents for Hospital Follow Up today.   Patient Active Problem List   Diagnosis Date Noted  . Acute cerebrovascular accident (CVA) due to ischemia ([place]) 04/28/2020  . Type 2 diabetes mellitus with hyperlipidemia ([place])   . PAD (peripheral artery disease) ([place])   . Hypoglycemia 04/25/2020  . Acute ST elevation myocardial infarction (STEMI) due to occlusion of circumflex coronary artery ([place]) 03/22/2020  . Uncontrolled type 2 diabetes mellitus with hyperglycemia ([place]) 01/14/2019  . Elevated glucose 01/14/2019  . Hx of BKA, right ([place]) 01/14/2019  . Pressure injury of skin 11/11/2018  . STEMI (ST elevation myocardial infarction) ([place]) 11/10/2018  . CKD (chronic kidney disease) stage 4, GFR 15-29 ml/min (HCC) 11/10/2018  . Amputation stump infection ([place]) 10/28/2018  . Type II diabetes mellitus with renal manifestations ([place]) 08/07/2018  . Uncontrolled type 2 diabetes mellitus with hyperosmolar nonketotic hyperglycemia ([place]) 08/07/2018  . Non-pressure chronic ulcer of right calf limited to breakdown of skin ([place]) 07/06/2018  . Type 2 diabetes mellitus without complication, without long-term current use of insulin ([place]) 06/15/2018  . Chronic low back pain without sciatica 06/15/2018  . Idiopathic chronic venous hypertension of left lower extremity with ulcer and inflammation ([place])   . Venous stasis ulcer of left calf limited to breakdown of skin without varicose veins ([place]) 04/23/2018  . Acquired absence of right leg below knee ([place]) 06/13/2017  . Acute renal failure superimposed on stage 3 chronic kidney disease ([place]) 03/15/2017  . Diabetic polyneuropathy associated with type 2 diabetes mellitus  ([place]) 01/23/2017  . AKI (acute kidney injury) ([place]) 09/28/2016  . Nausea vomiting and diarrhea 09/28/2016  . Onychomycosis of multiple toenails with type 2 diabetes mellitus ([place]) 08/29/2015  . MRSA carrier 04/20/2014  . Penile wart 02/22/2014  . HIV disease ([place])   . Insulin-requiring or dependent type II diabetes mellitus ([place]) 02/04/2014  . Dental anomaly 11/20/2012  . Arthritis of right knee 02/23/2012  . Hyperlipidemia 11/10/2011  . Chronic pain 08/07/2011  . Meralgia paraesthetica 04/23/2011  . ERECTILE DYSFUNCTION 08/22/2008  . Essential hypertension 05/19/2008   Current Status: Since his last office visit, he is doing well with no complaints. He arrives using assistance of Rollator with right leg prosthetic He denies visual changes, chest pain, cough, shortness of breath, heart palpitations, and falls. He has occasional headaches and dizziness with position changes. Denies severe headaches, confusion, seizures, double vision, and blurred vision, nausea and vomiting. He denies fatigue, frequent urination, blurred vision, excessive hunger, excessive thirst, weight gain, weight loss, and poor wound healing. He continues to check his feet regularly. He denies fevers, chills, recent infections, weight loss, and night sweats.  Denies GI problems such as diarrhea, and constipation. He has no reports of blood in stools, dysuria and hematuria. No depression or anxiety reported today. He is taking all medications as prescribed. He denies pain today.   Past Medical History:  Diagnosis Date  . Abscess of right foot    abscess/ulcer of R transtibial amputation requiring IV abx  . AIDS ([place])   . Anemia   . CAD (coronary artery disease)    a. MI with stenting of  OM1 in 11/2018 with residual disease. b. acute STEMI 03/2020 s/p DES to OM1  . Chronic anemia   . Chronic knee pain    right  . Chronic pain   . CKD (chronic kidney disease), stage IV ([place])   . CVA (cerebral vascular accident) ([place])  04/2020  . Diabetes type 2, uncontrolled ([place])    HgA1c 17.6 (04/27/2010)  . Diabetic foot ulcer ([place]) 01/2017   right foot  . Dilatation of aorta (HCC)   . Erectile dysfunction   . Genital warts   . HIV (human immunodeficiency virus infection) ([place]) 2009   CD4 count 100, VL 13800 (05/01/2010)  . Hyperlipidemia   . Hypertension   . Noncompliance with medication regimen   . Osteomyelitis ([place])    h/o hand  . Osteomyelitis of metatarsal ([place]) 04/28/2017    Past Surgical History:  Procedure Laterality Date  . AMPUTATION Right 05/02/2017   Procedure: AMPUTATION TRANSMETARSAL;  Surgeon: [name], MD;  Location: [facility];  Service: Orthopedics;  Laterality: Right;  . AMPUTATION Right 06/13/2017   Procedure: RIGHT BELOW KNEE AMPUTATION;  Surgeon: [name], MD;  Location: [facility];  Service: Orthopedics;  Laterality: Right;  . BELOW KNEE LEG AMPUTATION Right 06/13/2017  . BELOW KNEE LEG AMPUTATION    . CORONARY STENT INTERVENTION N/A 11/10/2018   Procedure: CORONARY STENT INTERVENTION;  Surgeon: Leonie Man, MD;  Location: [facility];  Service: Cardiovascular;  Laterality: N/A;  . CORONARY/GRAFT ACUTE MI REVASCULARIZATION N/A 03/21/2020   Procedure: Coronary/Graft Acute MI Revascularization;  Surgeon: [name], MD;  Location: [facility];  Service: Cardiovascular;  Laterality: N/A;  . HERNIA REPAIR    . I & D EXTREMITY Left 08/21/2014   Procedure: INCISION AND DRAINAGE LEFT SMALL FINGER;  Surgeon: Leanora Cover, MD;  Location: [facility];  Service: Orthopedics;  Laterality: Left;  . I & D EXTREMITY Right 03/18/2017   Procedure: IRRIGATION AND DEBRIDEMENT EXTREMITY;  Surgeon: [name], MD;  Location: [facility];  Service: Orthopedics;  Laterality: Right;  . LEFT HEART CATH AND CORONARY ANGIOGRAPHY N/A 11/10/2018   Procedure: LEFT HEART CATH AND CORONARY ANGIOGRAPHY;  Surgeon: Leonie Man, MD;  Location: [facility];  Service: Cardiovascular;  Laterality:  N/A;  . LEFT HEART CATH AND CORONARY ANGIOGRAPHY N/A 03/21/2020   Procedure: LEFT HEART CATH AND CORONARY ANGIOGRAPHY;  Surgeon: [name], MD;  Location: [facility];  Service: Cardiovascular;  Laterality: N/A;  . MINOR IRRIGATION AND DEBRIDEMENT OF WOUND Right 04/22/2014   Procedure: IRRIGATION AND DEBRIDEMENT OF RIGHT NECK ABCESS;  Surgeon: Jerrell Belfast, MD;  Location: [facility];  Service: ENT;  Laterality: Right;  . MULTIPLE EXTRACTIONS WITH ALVEOLOPLASTY N/A 01/18/2013   Procedure: MULTIPLE EXTRACION 3, 6, 7, 10, 11, 13, 21, 22, 27, 28, 29, 30 WITH ALVEOLOPLASTY;  Surgeon: Gae Bon, DDS;  Location: [facility];  Service: Oral Surgery;  Laterality: N/A;  . SKIN SPLIT GRAFT Right 03/21/2017   Procedure: IRRIGATION AND DEBRIDEMENT RIGHT FOOT AND APPLY SPLIT THICKNESS SKIN GRAFT AND WOUND VAC;  Surgeon: [name], MD;  Location: [facility];  Service: Orthopedics;  Laterality: Right;  . TEE WITHOUT CARDIOVERSION N/A 05/02/2017   Procedure: TRANSESOPHAGEAL ECHOCARDIOGRAM (TEE);  Surgeon: Acie Fredrickson Wonda Cheng, MD;  [facility];  Service: Cardiovascular;  Laterality: N/A;  coincidental to orthopedic case    Family History  Problem Relation Age of Onset  . Hypertension Mother   .  Arthritis Father   . Hypertension Father   . Hypertension Brother   . Cancer Maternal Grandmother 51       unknown type of cancer  . Depression Paternal Grandmother     Social History   Socioeconomic History  . Marital status: Single    Spouse name: Not on file  . Number of children: 3  . Years of education: 80  . Highest education level: Not on file  Occupational History  . Occupation: disabled  Tobacco Use  . Smoking status: Never Smoker  . Smokeless tobacco: Never Used  Vaping Use  . Vaping Use: Never used  Substance and Sexual Activity  . Alcohol use: Not Currently  . Drug use: Not Currently    Comment: OTC ASA  . Sexual activity: Yes    Partners: Female  Other Topics Concern  . Not  on file  Social History Narrative   ** Merged History Encounter **       Worked for the city of [place] for 18 years.   Unemployed.    Applying for disability.   Medicaid patient.   Social Determinants of Health   Financial Resource Strain: Not on file  Food Insecurity: Not on file  Transportation Needs: Not on file  Physical Activity: Not on file  Stress: Not on file  Social Connections: Not on file  Intimate Partner Violence: Not on file    Outpatient Medications Prior to Visit  Medication Sig Dispense Refill  . acetaminophen (TYLENOL) 325 MG tablet Take 2 tablets (650 mg total) by mouth every 4 (four) hours as needed for mild pain, fever or headache.    Marland Kitchen amLODipine (NORVASC) 10 MG tablet Take 10 mg by mouth daily. 30 tablet 6  . aspirin 81 MG EC tablet Take 1 tablet (81 mg total) by mouth daily. 30 tablet 12  . atorvastatin (LIPITOR) 80 MG tablet TAKE 1 TABLET (80 MG TOTAL) BY MOUTH DAILY AT 6 PM. (Patient taking differently: Take by mouth daily. at 6pm) 90 tablet 3  . bictegravir-emtricitabine-tenofovir AF (BIKTARVY) 50-200-25 MG TABS tablet Take 1 tablet by mouth daily. 90 tablet 3  . carvedilol (COREG) 25 MG tablet TAKE 1 TABLET (25 MG TOTAL) BY MOUTH TWO TIMES DAILY WITH A MEAL. PLEASE MAKE APPT FOR FUTURE REFILLS. [id number]. 1ST ATTEMPT. 60 tablet 6  . clopidogrel (PLAVIX) 75 MG tablet Take 75 mg by mouth daily.    . dapsone 100 MG tablet TAKE 1 TABLET (100 MG TOTAL) BY MOUTH DAILY. 30 tablet 0  . EASY COMFORT PEN NEEDLES 31G X 5 MM MISC INJECT 20 UNITS DAILY AS DIRECTED. 100 each 5  . gabapentin (NEURONTIN) 100 MG capsule TAKE 1 CAPSULE (100 MG TOTAL) BY MOUTH THREE TIMES DAILY. 90 capsule 0  . glucose blood (COOL BLOOD GLUCOSE TEST STRIPS) test strip Use as instructed 100 each 0  . icosapent Ethyl (VASCEPA) 1 g capsule TAKE 2 CAPSULES (2 G TOTAL) BY MOUTH TWO TIMES DAILY. 120 capsule 3  . insulin detemir (LEVEMIR) 100 UNIT/ML FlexPen INJECT 50 UNITS INTO THE SKIN  DAILY. 30 mL 3  . insulin glargine (LANTUS) 100 UNIT/ML injection Inject 42 Units into the skin at bedtime.    . Insulin Pen Needle 32G X 4 MM MISC USE AS DIRECTED FOUR TIMES DAILY (Patient taking differently: 4 (four) times daily. as directed) 100 each 0  . Insulin Syringes, Disposable, U-100 0.3 ML MISC 1 Syringe by Does not apply route 3 (three) times daily. 100 each 11  .  Lancets (ONETOUCH ULTRASOFT) lancets Use as instructed 100 each 12  . LEVEMIR 100 UNIT/ML injection Inject 0.4 mLs (40 Units total) into the skin daily. 30 mL 3  . nitroGLYCERIN (NITROSTAT) 0.4 MG SL tablet PLACE 1 TABLET (0.4 MG TOTAL) UNDER THE TONGUE EVERY FIVE MINUTES X 3 DOSES AS NEEDED FOR CHEST PAIN. 25 tablet 12  . omega-3 acid ethyl esters (LOVAZA) 1 g capsule Take 1 g by mouth 2 (two) times daily.    Marland Kitchen oxybutynin (DITROPAN) 5 MG tablet TAKE 1 TABLET (5 MG TOTAL) BY MOUTH TWO TIMES DAILY. 60 tablet 2  . oxyCODONE 10 MG TABS Take 1 tablet (10 mg total) by mouth every 6 (six) hours as needed for severe pain (Severe pain.). 10 tablet 0  . ticagrelor (BRILINTA) 90 MG TABS tablet T0KE 1 TABLET (90 MG TOTAL) BY MOUTH TWO TIMES DAILY. 60 tablet 0  . blood glucose meter kit and supplies Dispense based on patient and insurance preference. Use up to four times daily as directed. (FOR ICD-10 E10.9, E11.9). 1 each 0  . insulin aspart (NOVOLOG) 100 UNIT/ML FlexPen INJECT 10 UNITS UNDER SKIN THREE TIMES A DAY, WITH MEALS. (Patient not taking: Reported on 07/10/2020) 15 mL 11   No facility-administered medications prior to visit.    Allergies  Allergen Reactions  . Elemental Sulfur Itching    Patient stated he's allergic to "sulfur" AND "sulfa"  . Sulfa Antibiotics Itching    ROS Review of Systems  Constitutional: Negative.   HENT: Negative.   Eyes: Negative.   Respiratory: Negative.   Cardiovascular: Positive for leg swelling (bilateral ).  Endocrine: Negative.   Genitourinary: Negative.   Allergic/Immunologic:  Negative.   Neurological: Positive for dizziness (occasional ), weakness (generalized; right extremity weakness r/t recent stroke) and headaches (occasional ).  Hematological: Negative.   Psychiatric/Behavioral: Negative.       Objective:    Physical Exam Vitals and nursing note reviewed.  Constitutional:      Appearance: Normal appearance.  HENT:     Head: Normocephalic and atraumatic.     Nose: Nose normal.     Mouth/Throat:     Mouth: Mucous membranes are moist.     Pharynx: Oropharynx is clear.  Cardiovascular:     Rate and Rhythm: Normal rate and regular rhythm.     Pulses: Normal pulses.     Heart sounds: Normal heart sounds.  Pulmonary:     Effort: Pulmonary effort is normal.     Breath sounds: Normal breath sounds.  Abdominal:     General: Bowel sounds are normal. There is distension (occasional ).     Palpations: Abdomen is soft.  Musculoskeletal:        General: Swelling (bilateral lower extremities) present.     Cervical back: Normal range of motion and neck supple.     Right lower leg: Edema present.     Left lower leg: Edema present.     Comments: History of right BKA  Skin:    General: Skin is warm and dry.  Neurological:     General: No focal deficit present.     Mental Status: He is alert and oriented to person, place, and time.  Psychiatric:        Mood and Affect: Mood normal.        Behavior: Behavior normal.        Thought Content: Thought content normal.        Judgment: Judgment normal.     BP Marland Kitchen)  145/81   Pulse 75   Ht 5\' 9"  (1.753 m)   Wt 292 lb (132.5 kg)   SpO2 100%   BMI 43.12 kg/m  Wt Readings from Last 3 Encounters:  07/10/20 292 lb (132.5 kg)  04/25/20 259 lb (117.5 kg)  04/07/20 243 lb (110.2 kg)     Health Maintenance Due  Topic Date Due  . COVID-19 Vaccine (1) Never done  . COLONOSCOPY (Pts 45-41yrs Insurance coverage will need to be confirmed)  Never done  . OPHTHALMOLOGY EXAM  06/23/2012  . FOOT EXAM  02/23/2015  .  URINE MICROALBUMIN  02/23/2015    There are no preventive care reminders to display for this patient.  Lab Results  Component Value Date   TSH 2.331 04/25/2020   Lab Results  Component Value Date   WBC 7.3 05/19/2020   HGB 9.5 (L) 05/19/2020   HCT 29.1 (L) 05/19/2020   MCV 83.6 05/19/2020   PLT 217 05/19/2020   Lab Results  Component Value Date   NA 138 05/19/2020   K 4.3 05/19/2020   CO2 22 05/19/2020   GLUCOSE 130 (H) 05/19/2020   BUN 26 (H) 05/19/2020   CREATININE 1.77 (H) 05/19/2020   BILITOT 0.4 05/19/2020   ALKPHOS 68 04/25/2020   AST 21 05/19/2020   ALT 15 05/19/2020   PROT 7.0 05/19/2020   ALBUMIN 3.3 (L) 04/25/2020   CALCIUM 8.8 05/19/2020   ANIONGAP 9 05/01/2020   Lab Results  Component Value Date   CHOL 280 (H) 04/25/2020   Lab Results  Component Value Date   HDL 32 (L) 04/25/2020   Lab Results  Component Value Date   LDLCALC 205 (H) 04/25/2020   Lab Results  Component Value Date   TRIG 217 (H) 04/25/2020   Lab Results  Component Value Date   CHOLHDL 8.8 04/25/2020   Lab Results  Component Value Date   HGBA1C 10.6 (H) 05/19/2020      Assessment & Plan:   1. Hospital discharge follow-up  2. Uncontrolled type 2 diabetes mellitus with hyperglycemia (HCC) He will continue medication as prescribed, to decrease foods/beverages high in sugars and carbs and follow Heart Healthy or DASH diet. Increase physical activity to at least 30 minutes cardio exercise daily.  - blood glucose meter kit and supplies; Dispense based on patient and insurance preference. Use up to four times daily as directed. (FOR ICD-10 E10.9, E11.9).  Dispense: 1 each; Refill: 0 - POCT glycosylated hemoglobin (Hb A1C)  3. Type 2 diabetes mellitus with other specified complication, with long-term current use of insulin (HCC) He will continue medication as prescribed, to decrease foods/beverages high in sugars and carbs and follow Heart Healthy or DASH diet. Increase physical  activity to at least 30 minutes cardio exercise daily.  - blood glucose meter kit and supplies; Dispense based on patient and insurance preference. Use up to four times daily as directed. (FOR ICD-10 E10.9, E11.9).  Dispense: 1 each; Refill: 0  4. Hemoglobin A1C greater than 9%, indicating poor diabetic control Improved at 10.6 today. Continue medications as prescribed.   5. Hyperglycemia  6. Elevated glucose - blood glucose meter kit and supplies; Dispense based on patient and insurance preference. Use up to four times daily as directed. (FOR ICD-10 E10.9, E11.9).  Dispense: 1 each; Refill: 0  7. Hx of BKA, right (HCC) Stable.  8. HIV disease (HCC) He will continue to follow up with Infection Disease as needed   9. Anxiet  10. Follow up  He will follow up in 3 months.   Meds ordered this encounter  Medications  . blood glucose meter kit and supplies    Sig: Dispense based on patient and insurance preference. Use up to four times daily as directed. (FOR ICD-10 E10.9, E11.9).    Dispense:  1 each    Refill:  0    Order Specific Question:   Number of strips    Answer:   100    Order Specific Question:   Number of lancets    Answer:   100    Orders Placed This Encounter  Procedures  . POCT glycosylated hemoglobin (Hb A1C)    Referral Orders  No referral(s) requested today    Kathe Becton, MSN, ANE, FNP-BC [address], [place] [address] [phone] fax   Problem List Items Addressed This Visit      Endocrine   Uncontrolled type 2 diabetes mellitus with hyperglycemia ([place])   Relevant Medications   blood glucose meter kit and supplies   Other Relevant Orders   POCT glycosylated hemoglobin (Hb A1C)     Other   Elevated glucose   Relevant Medications   blood glucose meter kit and supplies   HIV disease (HCC) (Chronic)   Hx of BKA, right  ([place])    Other Visit Diagnoses    Hospital discharge follow-up    -  Primary   Type 2 diabetes mellitus with other specified complication, with long-term current use of insulin (HCC)       Relevant Medications   blood glucose meter kit and supplies   Hemoglobin A1C greater than 9%, indicating poor diabetic control       Hyperglycemia       Anxiety       Follow up          Meds ordered this encounter  Medications  . blood glucose meter kit and supplies    Sig: Dispense based on patient and insurance preference. Use up to four times daily as directed. (FOR ICD-10 E10.9, E11.9).    Dispense:  1 each    Refill:  0    Order Specific Question:   Number of strips    Answer:   100    Order Specific Question:   Number of lancets    Answer:   100    Follow-up: No follow-ups on file.    [name], FNP

## 2020-07-12 ENCOUNTER — Encounter: Payer: Self-pay | Admitting: Family Medicine

## 2020-07-14 ENCOUNTER — Telehealth: Payer: Self-pay

## 2020-07-14 NOTE — Telephone Encounter (Addendum)
Needs his meter to be sent  Also can get a referral to kidney doctor

## 2020-07-17 ENCOUNTER — Other Ambulatory Visit: Payer: Self-pay | Admitting: Family Medicine

## 2020-07-17 DIAGNOSIS — Z794 Long term (current) use of insulin: Secondary | ICD-10-CM

## 2020-07-17 DIAGNOSIS — E1169 Type 2 diabetes mellitus with other specified complication: Secondary | ICD-10-CM

## 2020-07-17 DIAGNOSIS — N184 Chronic kidney disease, stage 4 (severe): Secondary | ICD-10-CM

## 2020-07-17 DIAGNOSIS — E1165 Type 2 diabetes mellitus with hyperglycemia: Secondary | ICD-10-CM

## 2020-07-17 DIAGNOSIS — R7309 Other abnormal glucose: Secondary | ICD-10-CM

## 2020-07-17 MED ORDER — BLOOD GLUCOSE METER KIT: PACK | 0 refills | Status: DC

## 2020-07-17 NOTE — Telephone Encounter (Signed)
Forwarding to [employer]

## 2020-07-19 ENCOUNTER — Ambulatory Visit (INDEPENDENT_AMBULATORY_CARE_PROVIDER_SITE_OTHER): Payer: Medicare HMO | Admitting: Family

## 2020-07-19 ENCOUNTER — Encounter: Payer: Self-pay | Admitting: Family

## 2020-07-19 DIAGNOSIS — Z634 Disappearance and death of family member: Secondary | ICD-10-CM | POA: Diagnosis not present

## 2020-07-19 DIAGNOSIS — Z89511 Acquired absence of right leg below knee: Secondary | ICD-10-CM | POA: Diagnosis not present

## 2020-07-19 DIAGNOSIS — G4700 Insomnia, unspecified: Secondary | ICD-10-CM | POA: Diagnosis not present

## 2020-07-19 DIAGNOSIS — F411 Generalized anxiety disorder: Secondary | ICD-10-CM | POA: Diagnosis not present

## 2020-07-19 DIAGNOSIS — R69 Illness, unspecified: Secondary | ICD-10-CM | POA: Diagnosis not present

## 2020-07-19 IMAGING — DX DG CHEST 1V PORT
1 series · 1 of 1 positions shown · non-contrast
Comparison: 11/10/2018

CLINICAL DATA: Cough for 1 day, COVID test pending

EXAM:
PORTABLE CHEST 1 VIEW

[chest ap]
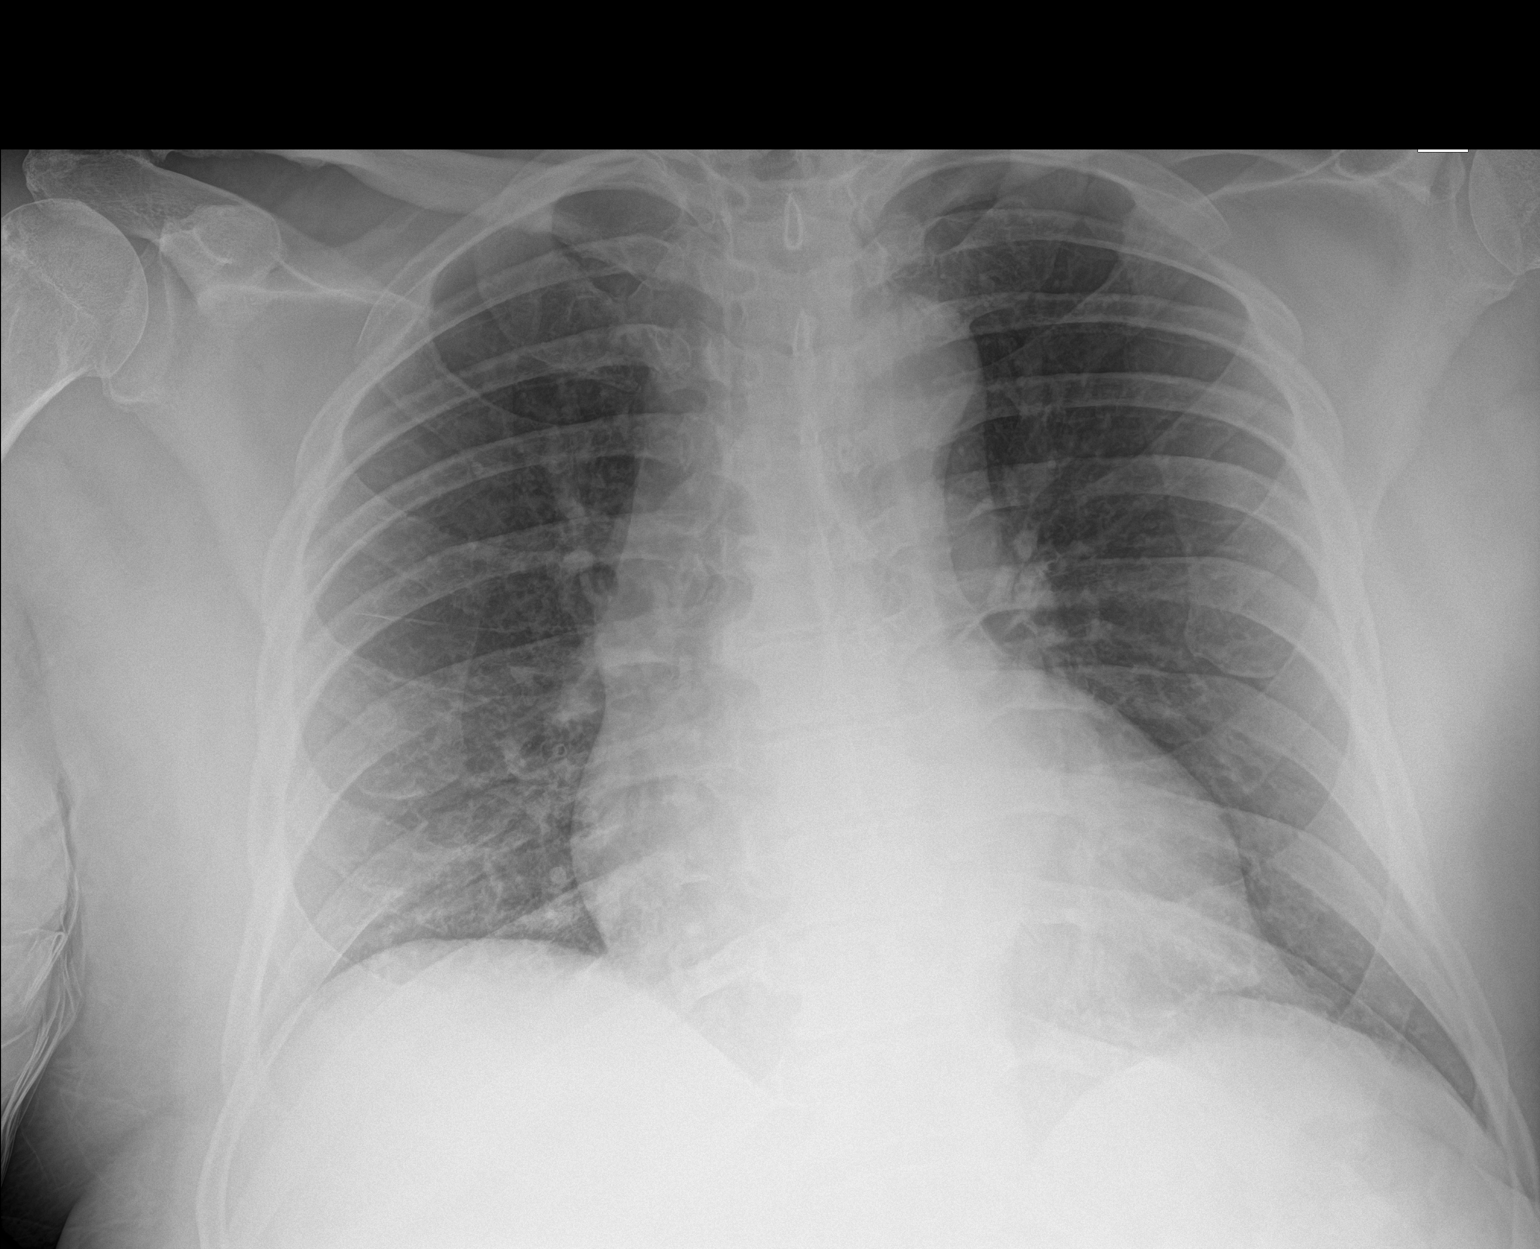

[1 of 1 positions shown; findings below may reference images not displayed]

FINDINGS: Cardiomediastinal contours and hilar structures are normal.

Subtle added density at the RIGHT lung base. No sign of effusion or
dense consolidation.

Visualized skeletal structures are unremarkable.
IMPRESSION: Subtle added density at the RIGHT lung base may represent
atelectasis or developing infection.

## 2020-07-19 NOTE — Progress Notes (Signed)
Office Visit Note   Patient: Keith [name]           Date of Birth: June 21, 1965           MRN: [name] Visit Date: 07/19/2020              Requested by: [name], [place],  [place] [address] PCP: [name], FNP  No chief complaint on file.     HPI: The patient is a 55 year old gentleman who presents today for prosthetic evaluation.  He is status post right below-knee amputation and is now working on his third socket.  Unfortunately this is ill fitting it is loose and falling off as he ambulates to the exam room.  He is bent over to biotech and had padding and modifications to his socket continues to be far too loose.  He is recently gotten more layers of ply and the socket continues to be loose and ill fitting he fears falling has had to begin walking with a walker for stability  Assessment & Plan: Visit Diagnoses: No diagnosis found.  Plan: Given an order to biotech for new socket as well as supplies for his right below-knee amputation he will follow-up with [place] as needed  Follow-Up Instructions: Return if symptoms worsen or fail to improve.   Ortho Exam  Patient is alert, oriented, no adenopathy, well-dressed, normal affect, normal respiratory effort. On examination of the right residual limb this is well consolidated well-healed there is no pressure areas no wounds no callus no skin breakdown  Imaging: No results found. No images are attached to the encounter.  Labs: Lab Results  Component Value Date   HGBA1C 10.6 (H) 05/19/2020   HGBA1C >15.5 (H) 03/24/2020   HGBA1C >15.5 (H) 03/21/2020   ESRSEDRATE 130 (H) 11/02/2018   ESRSEDRATE 140 (H) 10/30/2018   ESRSEDRATE 74 (H) 06/06/2018   CRP <0.8 11/02/2018   CRP 2.0 (H) 10/30/2018   CRP <0.8 06/06/2018   LABURIC 6.3 08/07/2009   REPTSTATUS 11/08/2018 FINAL 11/03/2018   REPTSTATUS 11/08/2018 FINAL 11/03/2018   GRAMSTAIN  04/29/2017    NO WBC SEEN RARE GRAM POSITIVE COCCI IN  PAIRS IN SINGLES    CULT  11/03/2018    NO GROWTH 5 DAYS Performed at [facility], [place] [address] [place], [place] [address]    CULT  11/03/2018    NO GROWTH 5 DAYS Performed at [facility] [address], [place], [place] [address]    LABORGA METHICILLIN RESISTANT STAPHYLOCOCCUS AUREUS 04/29/2017     Lab Results  Component Value Date   ALBUMIN 3.3 (L) 04/25/2020   ALBUMIN 2.5 (L) 03/26/2020   ALBUMIN 2.3 (L) 03/25/2020   PREALBUMIN 25.5 06/06/2018   PREALBUMIN 7.4 (L) 03/15/2017    Lab Results  Component Value Date   MG 2.1 04/28/2020   MG 2.0 04/27/2020   MG 2.0 04/26/2020   Lab Results  Component Value Date   VD25OH 8.2 (L) 09/07/2018    Lab Results  Component Value Date   PREALBUMIN 25.5 06/06/2018   PREALBUMIN 7.4 (L) 03/15/2017   CBC EXTENDED Latest Ref Rng & Units 05/19/2020 05/01/2020 04/28/2020  WBC 3.8 - 10.8 Thousand/uL 7.3 6.1 6.0  RBC 4.20 - 5.80 Million/uL 3.48(L) 4.02(L) 3.81(L)  HGB 13.2 - 17.1 g/dL 9.5(L) 10.5(L) 10.4(L)  HCT 38.5 - 50.0 % 29.1(L) 34.5(L) 32.5(L)  PLT 140 - 400 Thousand/uL 217 256 243  NEUTROABS 1.7 - 7.7 K/uL - - -  LYMPHSABS 0.7 - 4.0 K/uL - - -     There is no height or weight on file to calculate BMI.  Orders:  No orders of the defined types were placed in this encounter.  No orders of the defined types were placed in this encounter.    Procedures: No procedures performed  Clinical Data: No additional findings.  ROS:  All other systems negative, except as noted in the HPI. Review of Systems  Constitutional: Negative for chills and fever.  Cardiovascular: Negative for leg swelling.  Skin: Negative for color change and wound.    Objective: Vital Signs: There were no vitals taken for this visit.  Specialty Comments:  No specialty comments available.  PMFS History: Patient Active Problem List   Diagnosis Date Noted  . Acute cerebrovascular accident (CVA) due to ischemia ([place]) 04/28/2020   . Type 2 diabetes mellitus with hyperlipidemia ([place])   . PAD (peripheral artery disease) ([place])   . Hypoglycemia 04/25/2020  . Acute ST elevation myocardial infarction (STEMI) due to occlusion of circumflex coronary artery ([place]) 03/22/2020  . Uncontrolled type 2 diabetes mellitus with hyperglycemia ([place]) 01/14/2019  . Elevated glucose 01/14/2019  . Hx of BKA, right ([place]) 01/14/2019  . Pressure injury of skin 11/11/2018  . STEMI (ST elevation myocardial infarction) ([place]) 11/10/2018  . CKD (chronic kidney disease) stage 4, GFR 15-29 ml/min (HCC) 11/10/2018  . Amputation stump infection ([place]) 10/28/2018  . Type II diabetes mellitus with renal manifestations ([place]) 08/07/2018  . Uncontrolled type 2 diabetes mellitus with hyperosmolar nonketotic hyperglycemia ([place]) 08/07/2018  . Non-pressure chronic ulcer of right calf limited to breakdown of skin ([place]) 07/06/2018  . Type 2 diabetes mellitus without complication, without long-term current use of insulin ([place]) 06/15/2018  . Chronic low back pain without sciatica 06/15/2018  . Idiopathic chronic venous hypertension of left lower extremity with ulcer and inflammation ([place])   . Venous stasis ulcer of left calf limited to breakdown of skin without varicose veins ([place]) 04/23/2018  . Acquired absence of right leg below knee ([place]) 06/13/2017  . Acute renal failure superimposed on stage 3 chronic kidney disease ([place]) 03/15/2017  . Diabetic polyneuropathy associated with type 2 diabetes mellitus ([place]) 01/23/2017  . AKI (acute kidney injury) ([place]) 09/28/2016  . Nausea vomiting and diarrhea 09/28/2016  . Onychomycosis of multiple toenails with type 2 diabetes mellitus ([facility]) 08/29/2015  . MRSA carrier 04/20/2014  . Penile wart 02/22/2014  . HIV disease ([place])   . Insulin-requiring or dependent type II diabetes mellitus ([place]) 02/04/2014  . Dental anomaly 11/20/2012  . Arthritis of right knee 02/23/2012  . Hyperlipidemia 11/10/2011  . Chronic pain  08/07/2011  . Meralgia paraesthetica 04/23/2011  . ERECTILE DYSFUNCTION 08/22/2008  . Essential hypertension 05/19/2008   Past Medical History:  Diagnosis Date  . Abscess of right foot    abscess/ulcer of R transtibial amputation requiring IV abx  . AIDS ([place])   . Anemia   . CAD (coronary artery disease)    a. MI with stenting of OM1 in 11/2018 with residual disease. b. acute STEMI 03/2020 s/p DES to OM1  . Chronic anemia   . Chronic knee pain    right  . Chronic pain   . CKD (chronic kidney disease), stage IV ([place])   . CVA (cerebral vascular accident) ([place]) 04/2020  . Diabetes type 2, uncontrolled ([place])    HgA1c 17.6 (04/27/2010)  . Diabetic foot ulcer ([place]) 01/2017   right foot  . Dilatation of aorta (HCC)   .  Erectile dysfunction   . Genital warts   . HIV (human immunodeficiency virus infection) ([place]) 2009   CD4 count 100, VL 13800 (05/01/2010)  . Hyperlipidemia   . Hypertension   . Noncompliance with medication regimen   . Osteomyelitis ([place])    h/o hand  . Osteomyelitis of metatarsal ([place]) 04/28/2017    Family History  Problem Relation Age of Onset  . Hypertension Mother   . Arthritis Father   . Hypertension Father   . Hypertension Brother   . Cancer Maternal Grandmother 9       unknown type of cancer  . Depression Paternal Grandmother     Past Surgical History:  Procedure Laterality Date  . AMPUTATION Right 05/02/2017   Procedure: AMPUTATION TRANSMETARSAL;  Surgeon: [name], MD;  Location: [facility];  Service: Orthopedics;  Laterality: Right;  . AMPUTATION Right 06/13/2017   Procedure: RIGHT BELOW KNEE AMPUTATION;  Surgeon: [name], MD;  Location: [facility];  Service: Orthopedics;  Laterality: Right;  . BELOW KNEE LEG AMPUTATION Right 06/13/2017  . BELOW KNEE LEG AMPUTATION    . CORONARY STENT INTERVENTION N/A 11/10/2018   Procedure: CORONARY STENT INTERVENTION;  Surgeon: Leonie Man, MD;  Location: [facility];  Service: Cardiovascular;   Laterality: N/A;  . CORONARY/GRAFT ACUTE MI REVASCULARIZATION N/A 03/21/2020   Procedure: Coronary/Graft Acute MI Revascularization;  Surgeon: [name], MD;  Location: [facility];  Service: Cardiovascular;  Laterality: N/A;  . HERNIA REPAIR    . I & D EXTREMITY Left 08/21/2014   Procedure: INCISION AND DRAINAGE LEFT SMALL FINGER;  Surgeon: Leanora Cover, MD;  Location: [facility];  Service: Orthopedics;  Laterality: Left;  . I & D EXTREMITY Right 03/18/2017   Procedure: IRRIGATION AND DEBRIDEMENT EXTREMITY;  Surgeon: [name], MD;  Location: [facility];  Service: Orthopedics;  Laterality: Right;  . LEFT HEART CATH AND CORONARY ANGIOGRAPHY N/A 11/10/2018   Procedure: LEFT HEART CATH AND CORONARY ANGIOGRAPHY;  Surgeon: Leonie Man, MD;  Location: [facility];  Service: Cardiovascular;  Laterality: N/A;  . LEFT HEART CATH AND CORONARY ANGIOGRAPHY N/A 03/21/2020   Procedure: LEFT HEART CATH AND CORONARY ANGIOGRAPHY;  Surgeon: [name], MD;  Location: [facility];  Service: Cardiovascular;  Laterality: N/A;  . MINOR IRRIGATION AND DEBRIDEMENT OF WOUND Right 04/22/2014   Procedure: IRRIGATION AND DEBRIDEMENT OF RIGHT NECK ABCESS;  Surgeon: Jerrell Belfast, MD;  Location: [facility];  Service: ENT;  Laterality: Right;  . MULTIPLE EXTRACTIONS WITH ALVEOLOPLASTY N/A 01/18/2013   Procedure: MULTIPLE EXTRACION 3, 6, 7, 10, 11, 13, 21, 22, 27, 28, 29, 30 WITH ALVEOLOPLASTY;  Surgeon: Gae Bon, DDS;  Location: [facility];  Service: Oral Surgery;  Laterality: N/A;  . SKIN SPLIT GRAFT Right 03/21/2017   Procedure: IRRIGATION AND DEBRIDEMENT RIGHT FOOT AND APPLY SPLIT THICKNESS SKIN GRAFT AND WOUND VAC;  Surgeon: [name], MD;  Location: [facility];  Service: Orthopedics;  Laterality: Right;  . TEE WITHOUT CARDIOVERSION N/A 05/02/2017   Procedure: TRANSESOPHAGEAL ECHOCARDIOGRAM (TEE);  Surgeon: Acie Fredrickson Wonda Cheng, MD;  Location: [facility];  Service: Cardiovascular;  Laterality: N/A;   coincidental to orthopedic case   Social History   Occupational History  . Occupation: disabled  Tobacco Use  . Smoking status: Never Smoker  . Smokeless tobacco: Never Used  Vaping Use  . Vaping Use: Never used  Substance and Sexual Activity  . Alcohol use: Not Currently  .  Drug use: Not Currently    Comment: OTC ASA  . Sexual activity: Yes    Partners: Female

## 2020-07-31 ENCOUNTER — Telehealth: Payer: Self-pay

## 2020-07-31 NOTE — Telephone Encounter (Signed)
error 

## 2020-08-01 ENCOUNTER — Telehealth: Payer: Self-pay

## 2020-08-01 NOTE — Telephone Encounter (Signed)
Pt will need to call his pharmacy  to request his medication , pt was advise of his. Pt was not sure of what medication he needed.

## 2020-08-01 NOTE — Telephone Encounter (Signed)
Med refill  Blood thinner

## 2020-08-02 ENCOUNTER — Telehealth: Payer: Self-pay

## 2020-08-02 ENCOUNTER — Other Ambulatory Visit: Payer: Self-pay | Admitting: Family Medicine

## 2020-08-02 DIAGNOSIS — D631 Anemia in chronic kidney disease: Secondary | ICD-10-CM | POA: Diagnosis not present

## 2020-08-02 DIAGNOSIS — I129 Hypertensive chronic kidney disease with stage 1 through stage 4 chronic kidney disease, or unspecified chronic kidney disease: Secondary | ICD-10-CM | POA: Diagnosis not present

## 2020-08-02 DIAGNOSIS — E1122 Type 2 diabetes mellitus with diabetic chronic kidney disease: Secondary | ICD-10-CM | POA: Diagnosis not present

## 2020-08-02 DIAGNOSIS — N529 Male erectile dysfunction, unspecified: Secondary | ICD-10-CM | POA: Diagnosis not present

## 2020-08-02 DIAGNOSIS — I2510 Atherosclerotic heart disease of native coronary artery without angina pectoris: Secondary | ICD-10-CM | POA: Diagnosis not present

## 2020-08-02 DIAGNOSIS — G8929 Other chronic pain: Secondary | ICD-10-CM | POA: Diagnosis not present

## 2020-08-02 DIAGNOSIS — I69392 Facial weakness following cerebral infarction: Secondary | ICD-10-CM | POA: Diagnosis not present

## 2020-08-02 DIAGNOSIS — I69354 Hemiplegia and hemiparesis following cerebral infarction affecting left non-dominant side: Secondary | ICD-10-CM | POA: Diagnosis not present

## 2020-08-02 DIAGNOSIS — N184 Chronic kidney disease, stage 4 (severe): Secondary | ICD-10-CM | POA: Diagnosis not present

## 2020-08-02 DIAGNOSIS — I69322 Dysarthria following cerebral infarction: Secondary | ICD-10-CM | POA: Diagnosis not present

## 2020-08-02 NOTE — Telephone Encounter (Signed)
Med refill ::  Carvediolol '25mg'$  Clopidogrel '75mg'$    [facility]

## 2020-08-03 ENCOUNTER — Telehealth: Payer: Self-pay

## 2020-08-03 DIAGNOSIS — G8929 Other chronic pain: Secondary | ICD-10-CM | POA: Diagnosis not present

## 2020-08-03 DIAGNOSIS — I69392 Facial weakness following cerebral infarction: Secondary | ICD-10-CM | POA: Diagnosis not present

## 2020-08-03 DIAGNOSIS — I69322 Dysarthria following cerebral infarction: Secondary | ICD-10-CM | POA: Diagnosis not present

## 2020-08-03 DIAGNOSIS — E1122 Type 2 diabetes mellitus with diabetic chronic kidney disease: Secondary | ICD-10-CM | POA: Diagnosis not present

## 2020-08-03 DIAGNOSIS — I2510 Atherosclerotic heart disease of native coronary artery without angina pectoris: Secondary | ICD-10-CM | POA: Diagnosis not present

## 2020-08-03 DIAGNOSIS — D631 Anemia in chronic kidney disease: Secondary | ICD-10-CM | POA: Diagnosis not present

## 2020-08-03 DIAGNOSIS — N184 Chronic kidney disease, stage 4 (severe): Secondary | ICD-10-CM | POA: Diagnosis not present

## 2020-08-03 DIAGNOSIS — N529 Male erectile dysfunction, unspecified: Secondary | ICD-10-CM | POA: Diagnosis not present

## 2020-08-03 DIAGNOSIS — I129 Hypertensive chronic kidney disease with stage 1 through stage 4 chronic kidney disease, or unspecified chronic kidney disease: Secondary | ICD-10-CM | POA: Diagnosis not present

## 2020-08-03 DIAGNOSIS — I69354 Hemiplegia and hemiparesis following cerebral infarction affecting left non-dominant side: Secondary | ICD-10-CM | POA: Diagnosis not present

## 2020-08-03 NOTE — Telephone Encounter (Signed)
Per Estill Bamberg calling [facility] [phone]  Pt needs outpatient therapy

## 2020-08-03 NOTE — Telephone Encounter (Signed)
Called and spoke w/  Keith [name] she informed us that they will be d/c [facility] for [facility]. He no longer meets homebound status. He always late to his appt w/ home health or he is not home for his appt he out driving around. He had only total of 5 visit with them , they are going trying going out today for home p.t , after this visit he will d/c. She stated that he is not take medication correctly also. Due non compliance  he will be d/c

## 2020-08-09 DIAGNOSIS — R0989 Other specified symptoms and signs involving the circulatory and respiratory systems: Secondary | ICD-10-CM | POA: Diagnosis not present

## 2020-08-09 DIAGNOSIS — Z79899 Other long term (current) drug therapy: Secondary | ICD-10-CM | POA: Diagnosis not present

## 2020-08-09 DIAGNOSIS — G546 Phantom limb syndrome with pain: Secondary | ICD-10-CM | POA: Diagnosis not present

## 2020-08-09 DIAGNOSIS — L97329 Non-pressure chronic ulcer of left ankle with unspecified severity: Secondary | ICD-10-CM | POA: Diagnosis not present

## 2020-08-09 DIAGNOSIS — G894 Chronic pain syndrome: Secondary | ICD-10-CM | POA: Diagnosis not present

## 2020-08-10 DIAGNOSIS — N529 Male erectile dysfunction, unspecified: Secondary | ICD-10-CM | POA: Diagnosis not present

## 2020-08-10 DIAGNOSIS — I2510 Atherosclerotic heart disease of native coronary artery without angina pectoris: Secondary | ICD-10-CM | POA: Diagnosis not present

## 2020-08-10 DIAGNOSIS — I69354 Hemiplegia and hemiparesis following cerebral infarction affecting left non-dominant side: Secondary | ICD-10-CM | POA: Diagnosis not present

## 2020-08-10 DIAGNOSIS — D631 Anemia in chronic kidney disease: Secondary | ICD-10-CM | POA: Diagnosis not present

## 2020-08-10 DIAGNOSIS — I69322 Dysarthria following cerebral infarction: Secondary | ICD-10-CM | POA: Diagnosis not present

## 2020-08-10 DIAGNOSIS — N184 Chronic kidney disease, stage 4 (severe): Secondary | ICD-10-CM | POA: Diagnosis not present

## 2020-08-10 DIAGNOSIS — G8929 Other chronic pain: Secondary | ICD-10-CM | POA: Diagnosis not present

## 2020-08-10 DIAGNOSIS — I129 Hypertensive chronic kidney disease with stage 1 through stage 4 chronic kidney disease, or unspecified chronic kidney disease: Secondary | ICD-10-CM | POA: Diagnosis not present

## 2020-08-10 DIAGNOSIS — E1122 Type 2 diabetes mellitus with diabetic chronic kidney disease: Secondary | ICD-10-CM | POA: Diagnosis not present

## 2020-08-10 DIAGNOSIS — I69392 Facial weakness following cerebral infarction: Secondary | ICD-10-CM | POA: Diagnosis not present

## 2020-08-14 DIAGNOSIS — F411 Generalized anxiety disorder: Secondary | ICD-10-CM | POA: Diagnosis not present

## 2020-08-14 DIAGNOSIS — G4700 Insomnia, unspecified: Secondary | ICD-10-CM | POA: Diagnosis not present

## 2020-08-14 DIAGNOSIS — R69 Illness, unspecified: Secondary | ICD-10-CM | POA: Diagnosis not present

## 2020-08-17 ENCOUNTER — Ambulatory Visit (INDEPENDENT_AMBULATORY_CARE_PROVIDER_SITE_OTHER): Payer: Medicare HMO | Admitting: Orthopedic Surgery

## 2020-08-17 ENCOUNTER — Encounter: Payer: Self-pay | Admitting: Orthopedic Surgery

## 2020-08-17 DIAGNOSIS — I87332 Chronic venous hypertension (idiopathic) with ulcer and inflammation of left lower extremity: Secondary | ICD-10-CM

## 2020-08-17 DIAGNOSIS — Z89511 Acquired absence of right leg below knee: Secondary | ICD-10-CM | POA: Diagnosis not present

## 2020-08-17 DIAGNOSIS — L97929 Non-pressure chronic ulcer of unspecified part of left lower leg with unspecified severity: Secondary | ICD-10-CM | POA: Diagnosis not present

## 2020-08-17 NOTE — Progress Notes (Signed)
Office Visit Note   Patient: Keith [name]           Date of Birth: 1965-10-10           MRN: [id number] Visit Date: 08/17/2020              Requested by: No referring provider defined for this encounter. PCP: No primary care provider on file.  Chief Complaint  Patient presents with  . Left Leg - Pain      HPI: Patient is a 55 year old gentleman who presents for 2 separate issues.  #1 patient states he is subsiding into his socket on the right side and is having an bearing pressure issues.  #2 patient had blunt trauma to the left leg with venous insufficiency has developed a venous ulcer about 3 months ago and still has persistent ulceration.  Assessment & Plan: Visit Diagnoses:  1. Hx of right BKA (Keith [name])   2. Chronic venous hypertension (idiopathic) with ulcer and inflammation of left lower extremity (HCC)     Plan: A Dynaflex wrap was applied today we will plan on serial Dynaflex wraps until the swelling is resolved the ulcer is healing and then place him in compression stockings.  Follow-Up Instructions: Return in about 1 week (around 08/24/2020).   Ortho Exam  Patient is alert, oriented, no adenopathy, well-dressed, normal affect, normal respiratory effort. Examination patient is subsided into the socket on the right with an bearing pressure but no open ulcers.  The left leg he has massive pitting edema with a lateral ulcer that is 3 x 7 cm and 0.1 mm deep there is healthy granulation tissue in the bed of the wound there is no cellulitis no purulence no drainage.  Patient's last hemoglobin A1c was 10.6.  Imaging: No results found. No images are attached to the encounter.  Labs: Lab Results  Component Value Date   HGBA1C 10.6 (H) 05/19/2020   HGBA1C >15.5 (H) 03/24/2020   HGBA1C >15.5 (H) 03/21/2020   ESRSEDRATE 130 (H) 11/02/2018   ESRSEDRATE 140 (H) 10/30/2018   ESRSEDRATE 74 (H) 06/06/2018   CRP <0.8 11/02/2018   CRP 2.0 (H) 10/30/2018   CRP <0.8 06/06/2018    LABURIC 6.3 08/07/2009   REPTSTATUS 11/08/2018 FINAL 11/03/2018   REPTSTATUS 11/08/2018 FINAL 11/03/2018   GRAMSTAIN  04/29/2017    NO WBC SEEN RARE GRAM POSITIVE COCCI IN PAIRS IN SINGLES    CULT  11/03/2018    NO GROWTH 5 DAYS Performed at [facility], [place] [address], [place], [place] [address]    CULT  11/03/2018    NO GROWTH 5 DAYS Performed at [facility] [address], [place], [place] [address]    LABORGA METHICILLIN RESISTANT STAPHYLOCOCCUS AUREUS 04/29/2017     Lab Results  Component Value Date   ALBUMIN 3.3 (L) 04/25/2020   ALBUMIN 2.5 (L) 03/26/2020   ALBUMIN 2.3 (L) 03/25/2020   PREALBUMIN 25.5 06/06/2018   PREALBUMIN 7.4 (L) 03/15/2017    Lab Results  Component Value Date   MG 2.1 04/28/2020   MG 2.0 04/27/2020   MG 2.0 04/26/2020   Lab Results  Component Value Date   VD25OH 8.2 (L) 09/07/2018    Lab Results  Component Value Date   PREALBUMIN 25.5 06/06/2018   PREALBUMIN 7.4 (L) 03/15/2017   CBC EXTENDED Latest Ref Rng & Units 05/19/2020 05/01/2020 04/28/2020  WBC 3.8 - 10.8 Thousand/uL 7.3 6.1 6.0  RBC 4.20 - 5.80 Million/uL 3.48(L) 4.02(L) 3.81(L)  HGB 13.2 - 17.1 g/dL 9.5(L) 10.5(L) 10.4(L)  HCT 38.5 - 50.0 % 29.1(L) 34.5(L) 32.5(L)  PLT 140 - 400 Thousand/uL 217 256 243  NEUTROABS 1.7 - 7.7 K/uL - - -  LYMPHSABS 0.7 - 4.0 K/uL - - -     There is no height or weight on file to calculate BMI.  Orders:  No orders of the defined types were placed in this encounter.  No orders of the defined types were placed in this encounter.    Procedures: No procedures performed  Clinical Data: No additional findings.  ROS:  All other systems negative, except as noted in the HPI. Review of Systems  Objective: Vital Signs: There were no vitals taken for this visit.  Specialty Comments:  No specialty comments available.  PMFS History: Patient Active Problem List   Diagnosis Date Noted  . Acute cerebrovascular accident (CVA)  due to ischemia ([place]) 04/28/2020  . Type 2 diabetes mellitus with hyperlipidemia ([place])   . PAD (peripheral artery disease) ([place])   . Hypoglycemia 04/25/2020  . Acute ST elevation myocardial infarction (STEMI) due to occlusion of circumflex coronary artery ([place]) 03/22/2020  . Uncontrolled type 2 diabetes mellitus with hyperglycemia ([place]) 01/14/2019  . Elevated glucose 01/14/2019  . Hx of BKA, right ([place]) 01/14/2019  . Pressure injury of skin 11/11/2018  . STEMI (ST elevation myocardial infarction) ([place]) 11/10/2018  . CKD (chronic kidney disease) stage 4, GFR 15-29 ml/min (HCC) 11/10/2018  . Amputation stump infection ([place]) 10/28/2018  . Type II diabetes mellitus with renal manifestations ([place]) 08/07/2018  . Uncontrolled type 2 diabetes mellitus with hyperosmolar nonketotic hyperglycemia ([place]) 08/07/2018  . Non-pressure chronic ulcer of right calf limited to breakdown of skin ([place]) 07/06/2018  . Type 2 diabetes mellitus without complication, without long-term current use of insulin ([place]) 06/15/2018  . Chronic low back pain without sciatica 06/15/2018  . Idiopathic chronic venous hypertension of left lower extremity with ulcer and inflammation ([place])   . Venous stasis ulcer of left calf limited to breakdown of skin without varicose veins ([place]) 04/23/2018  . Acquired absence of right leg below knee ([place]) 06/13/2017  . Acute renal failure superimposed on stage 3 chronic kidney disease ([place]) 03/15/2017  . Diabetic polyneuropathy associated with type 2 diabetes mellitus ([place]) 01/23/2017  . AKI (acute kidney injury) ([place]) 09/28/2016  . Nausea vomiting and diarrhea 09/28/2016  . Onychomycosis of multiple toenails with type 2 diabetes mellitus ([place]) 08/29/2015  . MRSA carrier 04/20/2014  . Penile wart 02/22/2014  . HIV disease ([place])   . Insulin-requiring or dependent type II diabetes mellitus ([place]) 02/04/2014  . Dental anomaly 11/20/2012  . Arthritis of right knee 02/23/2012  . Hyperlipidemia  11/10/2011  . Chronic pain 08/07/2011  . Meralgia paraesthetica 04/23/2011  . ERECTILE DYSFUNCTION 08/22/2008  . Essential hypertension 05/19/2008   Past Medical History:  Diagnosis Date  . Abscess of right foot    abscess/ulcer of R transtibial amputation requiring IV abx  . AIDS ([place])   . Anemia   . CAD (coronary artery disease)    a. MI with stenting of OM1 in 11/2018 with residual disease. b. acute STEMI 03/2020 s/p DES to OM1  . Chronic anemia   . Chronic knee pain    right  . Chronic pain   . CKD (chronic kidney disease), stage IV ([place])   . CVA (cerebral vascular accident) ([place]) 04/2020  . Diabetes type 2, uncontrolled ([place])    HgA1c 17.6 (04/27/2010)  . Diabetic foot  ulcer ([place]) 01/2017   right foot  . Dilatation of aorta (HCC)   . Erectile dysfunction   . Genital warts   . HIV (human immunodeficiency virus infection) ([place]) 2009   CD4 count 100, VL 13800 (05/01/2010)  . Hyperlipidemia   . Hypertension   . Noncompliance with medication regimen   . Osteomyelitis ([place])    h/o hand  . Osteomyelitis of metatarsal ([place]) 04/28/2017    Family History  Problem Relation Age of Onset  . Hypertension Mother   . Arthritis Father   . Hypertension Father   . Hypertension Brother   . Cancer Maternal Grandmother 38       unknown type of cancer  . Depression Paternal Grandmother     Past Surgical History:  Procedure Laterality Date  . AMPUTATION Right 05/02/2017   Procedure: AMPUTATION TRANSMETARSAL;  Surgeon: [name], MD;  Location: [facility];  Service: Orthopedics;  Laterality: Right;  . AMPUTATION Right 06/13/2017   Procedure: RIGHT BELOW KNEE AMPUTATION;  Surgeon: [name], MD;  Location: [facility];  Service: Orthopedics;  Laterality: Right;  . BELOW KNEE LEG AMPUTATION Right 06/13/2017  . BELOW KNEE LEG AMPUTATION    . CORONARY STENT INTERVENTION N/A 11/10/2018   Procedure: CORONARY STENT INTERVENTION;  Surgeon: Leonie Man, MD;  Location: [facility];   Service: Cardiovascular;  Laterality: N/A;  . CORONARY/GRAFT ACUTE MI REVASCULARIZATION N/A 03/21/2020   Procedure: Coronary/Graft Acute MI Revascularization;  Surgeon: [name], MD;  Location: [facility];  Service: Cardiovascular;  Laterality: N/A;  . HERNIA REPAIR    . I & D EXTREMITY Left 08/21/2014   Procedure: INCISION AND DRAINAGE LEFT SMALL FINGER;  Surgeon: Leanora Cover, MD;  Location: [facility];  Service: Orthopedics;  Laterality: Left;  . I & D EXTREMITY Right 03/18/2017   Procedure: IRRIGATION AND DEBRIDEMENT EXTREMITY;  Surgeon: [name], MD;  Location: [facility];  Service: Orthopedics;  Laterality: Right;  . LEFT HEART CATH AND CORONARY ANGIOGRAPHY N/A 11/10/2018   Procedure: LEFT HEART CATH AND CORONARY ANGIOGRAPHY;  Surgeon: Leonie Man, MD;  Location: [facility];  Service: Cardiovascular;  Laterality: N/A;  . LEFT HEART CATH AND CORONARY ANGIOGRAPHY N/A 03/21/2020   Procedure: LEFT HEART CATH AND CORONARY ANGIOGRAPHY;  Surgeon: [name], MD;  Location: [facility];  Service: Cardiovascular;  Laterality: N/A;  . MINOR IRRIGATION AND DEBRIDEMENT OF WOUND Right 04/22/2014   Procedure: IRRIGATION AND DEBRIDEMENT OF RIGHT NECK ABCESS;  Surgeon: Jerrell Belfast, MD;  Location: [facility];  Service: ENT;  Laterality: Right;  . MULTIPLE EXTRACTIONS WITH ALVEOLOPLASTY N/A 01/18/2013   Procedure: MULTIPLE EXTRACION 3, 6, 7, 10, 11, 13, 21, 22, 27, 28, 29, 30 WITH ALVEOLOPLASTY;  Surgeon: Gae Bon, DDS;  Location: [facility];  Service: Oral Surgery;  Laterality: N/A;  . SKIN SPLIT GRAFT Right 03/21/2017   Procedure: IRRIGATION AND DEBRIDEMENT RIGHT FOOT AND APPLY SPLIT THICKNESS SKIN GRAFT AND WOUND VAC;  Surgeon: [name], MD;  Location: [facility];  Service: Orthopedics;  Laterality: Right;  . TEE WITHOUT CARDIOVERSION N/A 05/02/2017   Procedure: TRANSESOPHAGEAL ECHOCARDIOGRAM (TEE);  Surgeon: Acie Fredrickson Wonda Cheng, MD;  Location: [facility];  Service:  Cardiovascular;  Laterality: N/A;  coincidental to orthopedic case   Social History   Occupational History  . Occupation: disabled  Tobacco Use  . Smoking status: Never Smoker  . Smokeless tobacco: Never Used  Vaping Use  . Vaping  Use: Never used  Substance and Sexual Activity  . Alcohol use: Not Currently  . Drug use: Not Currently    Comment: OTC ASA  . Sexual activity: Yes    Partners: Female

## 2020-08-25 ENCOUNTER — Ambulatory Visit (INDEPENDENT_AMBULATORY_CARE_PROVIDER_SITE_OTHER): Payer: Medicare HMO | Admitting: Physician Assistant

## 2020-08-25 ENCOUNTER — Encounter: Payer: Self-pay | Admitting: Physician Assistant

## 2020-08-25 DIAGNOSIS — I87332 Chronic venous hypertension (idiopathic) with ulcer and inflammation of left lower extremity: Secondary | ICD-10-CM | POA: Diagnosis not present

## 2020-08-25 DIAGNOSIS — L97929 Non-pressure chronic ulcer of unspecified part of left lower leg with unspecified severity: Secondary | ICD-10-CM | POA: Diagnosis not present

## 2020-08-25 NOTE — Progress Notes (Signed)
Office Visit Note   Patient: Keith [name]           Date of Birth: 1966/03/03           MRN: [name] Visit Date: 08/25/2020              Requested by: No referring provider defined for this encounter. PCP: No primary care provider on file.  Chief Complaint  Patient presents with  . Left Leg - Follow-up      HPI: Patient is a pleasant 55 year old gentleman who has been undergoing Profore wrapping on his left leg because of a calf ulcer and excessive swelling.  He presents today in follow-up  Assessment & Plan: Visit Diagnoses: No diagnosis found.  Plan: We will wrap him 1 final time as well is give him a postoperative shoe.  He will follow-up in 1 week and will bring with him his double extra-large compression socks we will apply these at that time.  Follow-Up Instructions: No follow-ups on file.   Ortho Exam  Patient is alert, oriented, no adenopathy, well-dressed, normal affect, normal respiratory effort. Examination calves are soft no cellulitis no signs of infection.  He measures 45 at the widest aspect of his calf.  He has a large 5 cm in diameter ulcer that is completely covered in healthy bleeding tissue he does have some bloody drainage there is no surrounding cellulitis no foul odor no necrosis  Imaging: No results found. No images are attached to the encounter.  Labs: Lab Results  Component Value Date   HGBA1C 10.6 (H) 05/19/2020   HGBA1C >15.5 (H) 03/24/2020   HGBA1C >15.5 (H) 03/21/2020   ESRSEDRATE 130 (H) 11/02/2018   ESRSEDRATE 140 (H) 10/30/2018   ESRSEDRATE 74 (H) 06/06/2018   CRP <0.8 11/02/2018   CRP 2.0 (H) 10/30/2018   CRP <0.8 06/06/2018   LABURIC 6.3 08/07/2009   REPTSTATUS 11/08/2018 FINAL 11/03/2018   REPTSTATUS 11/08/2018 FINAL 11/03/2018   GRAMSTAIN  04/29/2017    NO WBC SEEN RARE GRAM POSITIVE COCCI IN PAIRS IN SINGLES    CULT  11/03/2018    NO GROWTH 5 DAYS Performed at [facility], [place] [address] [place], [place]  [address]    CULT  11/03/2018    NO GROWTH 5 DAYS Performed at [facility] [address], [place], [place] [address]    LABORGA METHICILLIN RESISTANT STAPHYLOCOCCUS AUREUS 04/29/2017     Lab Results  Component Value Date   ALBUMIN 3.3 (L) 04/25/2020   ALBUMIN 2.5 (L) 03/26/2020   ALBUMIN 2.3 (L) 03/25/2020   PREALBUMIN 25.5 06/06/2018   PREALBUMIN 7.4 (L) 03/15/2017    Lab Results  Component Value Date   MG 2.1 04/28/2020   MG 2.0 04/27/2020   MG 2.0 04/26/2020   Lab Results  Component Value Date   VD25OH 8.2 (L) 09/07/2018    Lab Results  Component Value Date   PREALBUMIN 25.5 06/06/2018   PREALBUMIN 7.4 (L) 03/15/2017   CBC EXTENDED Latest Ref Rng & Units 05/19/2020 05/01/2020 04/28/2020  WBC 3.8 - 10.8 Thousand/uL 7.3 6.1 6.0  RBC 4.20 - 5.80 Million/uL 3.48(L) 4.02(L) 3.81(L)  HGB 13.2 - 17.1 g/dL 9.5(L) 10.5(L) 10.4(L)  HCT 38.5 - 50.0 % 29.1(L) 34.5(L) 32.5(L)  PLT 140 - 400 Thousand/uL 217 256 243  NEUTROABS 1.7 - 7.7 K/uL - - -  LYMPHSABS 0.7 - 4.0 K/uL - - -     There is no height or weight on file  to calculate BMI.  Orders:  No orders of the defined types were placed in this encounter.  No orders of the defined types were placed in this encounter.    Procedures: No procedures performed  Clinical Data: No additional findings.  ROS:  All other systems negative, except as noted in the HPI. Review of Systems  Objective: Vital Signs: There were no vitals taken for this visit.  Specialty Comments:  No specialty comments available.  PMFS History: Patient Active Problem List   Diagnosis Date Noted  . Acute cerebrovascular accident (CVA) due to ischemia ([place]) 04/28/2020  . Type 2 diabetes mellitus with hyperlipidemia ([place])   . PAD (peripheral artery disease) ([place])   . Hypoglycemia 04/25/2020  . Acute ST elevation myocardial infarction (STEMI) due to occlusion of circumflex coronary artery ([place]) 03/22/2020  . Uncontrolled type 2  diabetes mellitus with hyperglycemia ([place]) 01/14/2019  . Elevated glucose 01/14/2019  . Hx of BKA, right ([place]) 01/14/2019  . Pressure injury of skin 11/11/2018  . STEMI (ST elevation myocardial infarction) ([place]) 11/10/2018  . CKD (chronic kidney disease) stage 4, GFR 15-29 ml/min (HCC) 11/10/2018  . Amputation stump infection ([place]) 10/28/2018  . Type II diabetes mellitus with renal manifestations ([place]) 08/07/2018  . Uncontrolled type 2 diabetes mellitus with hyperosmolar nonketotic hyperglycemia ([place]) 08/07/2018  . Non-pressure chronic ulcer of right calf limited to breakdown of skin ([place]) 07/06/2018  . Type 2 diabetes mellitus without complication, without long-term current use of insulin ([place]) 06/15/2018  . Chronic low back pain without sciatica 06/15/2018  . Idiopathic chronic venous hypertension of left lower extremity with ulcer and inflammation ([place])   . Venous stasis ulcer of left calf limited to breakdown of skin without varicose veins ([place]) 04/23/2018  . Acquired absence of right leg below knee ([place]) 06/13/2017  . Acute renal failure superimposed on stage 3 chronic kidney disease ([place]) 03/15/2017  . Diabetic polyneuropathy associated with type 2 diabetes mellitus ([place]) 01/23/2017  . AKI (acute kidney injury) ([place]) 09/28/2016  . Nausea vomiting and diarrhea 09/28/2016  . Onychomycosis of multiple toenails with type 2 diabetes mellitus ([place]) 08/29/2015  . MRSA carrier 04/20/2014  . Penile wart 02/22/2014  . HIV disease ([place])   . Insulin-requiring or dependent type II diabetes mellitus ([place]) 02/04/2014  . Dental anomaly 11/20/2012  . Arthritis of right knee 02/23/2012  . Hyperlipidemia 11/10/2011  . Chronic pain 08/07/2011  . Meralgia paraesthetica 04/23/2011  . ERECTILE DYSFUNCTION 08/22/2008  . Essential hypertension 05/19/2008   Past Medical History:  Diagnosis Date  . Abscess of right foot    abscess/ulcer of R transtibial amputation requiring IV abx  . AIDS ([employer])   .  Anemia   . CAD (coronary artery disease)    a. MI with stenting of OM1 in 11/2018 with residual disease. b. acute STEMI 03/2020 s/p DES to OM1  . Chronic anemia   . Chronic knee pain    right  . Chronic pain   . CKD (chronic kidney disease), stage IV ([place])   . CVA (cerebral vascular accident) ([place]) 04/2020  . Diabetes type 2, uncontrolled ([place])    HgA1c 17.6 (04/27/2010)  . Diabetic foot ulcer ([place]) 01/2017   right foot  . Dilatation of aorta (HCC)   . Erectile dysfunction   . Genital warts   . HIV (human immunodeficiency virus infection) ([place]) 2009   CD4 count 100, VL 13800 (05/01/2010)  . Hyperlipidemia   . Hypertension   . Noncompliance with medication regimen   .  Osteomyelitis ([place])    h/o hand  . Osteomyelitis of metatarsal ([place]) 04/28/2017    Family History  Problem Relation Age of Onset  . Hypertension Mother   . Arthritis Father   . Hypertension Father   . Hypertension Brother   . Cancer Maternal Grandmother 68       unknown type of cancer  . Depression Paternal Grandmother     Past Surgical History:  Procedure Laterality Date  . AMPUTATION Right 05/02/2017   Procedure: AMPUTATION TRANSMETARSAL;  Surgeon: [name], MD;  Location: [facility];  Service: Orthopedics;  Laterality: Right;  . AMPUTATION Right 06/13/2017   Procedure: RIGHT BELOW KNEE AMPUTATION;  Surgeon: [name], MD;  Location: [facility];  Service: Orthopedics;  Laterality: Right;  . BELOW KNEE LEG AMPUTATION Right 06/13/2017  . BELOW KNEE LEG AMPUTATION    . CORONARY STENT INTERVENTION N/A 11/10/2018   Procedure: CORONARY STENT INTERVENTION;  Surgeon: Leonie Man, MD;  Location: [facility];  Service: Cardiovascular;  Laterality: N/A;  . CORONARY/GRAFT ACUTE MI REVASCULARIZATION N/A 03/21/2020   Procedure: Coronary/Graft Acute MI Revascularization;  Surgeon: [name], MD;  Location: [facility];  Service: Cardiovascular;  Laterality: N/A;  . HERNIA REPAIR    . I & D  EXTREMITY Left 08/21/2014   Procedure: INCISION AND DRAINAGE LEFT SMALL FINGER;  Surgeon: Leanora Cover, MD;  Location: [facility];  Service: Orthopedics;  Laterality: Left;  . I & D EXTREMITY Right 03/18/2017   Procedure: IRRIGATION AND DEBRIDEMENT EXTREMITY;  Surgeon: [name], MD;  Location: [facility];  Service: Orthopedics;  Laterality: Right;  . LEFT HEART CATH AND CORONARY ANGIOGRAPHY N/A 11/10/2018   Procedure: LEFT HEART CATH AND CORONARY ANGIOGRAPHY;  Surgeon: Leonie Man, MD;  Location: [facility];  Service: Cardiovascular;  Laterality: N/A;  . LEFT HEART CATH AND CORONARY ANGIOGRAPHY N/A 03/21/2020   Procedure: LEFT HEART CATH AND CORONARY ANGIOGRAPHY;  Surgeon: [name], MD;  Location: [facility];  Service: Cardiovascular;  Laterality: N/A;  . MINOR IRRIGATION AND DEBRIDEMENT OF WOUND Right 04/22/2014   Procedure: IRRIGATION AND DEBRIDEMENT OF RIGHT NECK ABCESS;  Surgeon: Jerrell Belfast, MD;  Location: [facility];  Service: ENT;  Laterality: Right;  . MULTIPLE EXTRACTIONS WITH ALVEOLOPLASTY N/A 01/18/2013   Procedure: MULTIPLE EXTRACION 3, 6, 7, 10, 11, 13, 21, 22, 27, 28, 29, 30 WITH ALVEOLOPLASTY;  Surgeon: Gae Bon, DDS;  Location: [facility];  Service: Oral Surgery;  Laterality: N/A;  . SKIN SPLIT GRAFT Right 03/21/2017   Procedure: IRRIGATION AND DEBRIDEMENT RIGHT FOOT AND APPLY SPLIT THICKNESS SKIN GRAFT AND WOUND VAC;  Surgeon: [name], MD;  Location: [facility];  Service: Orthopedics;  Laterality: Right;  . TEE WITHOUT CARDIOVERSION N/A 05/02/2017   Procedure: TRANSESOPHAGEAL ECHOCARDIOGRAM (TEE);  Surgeon: Acie Fredrickson Wonda Cheng, MD;  Location: [facility];  Service: Cardiovascular;  Laterality: N/A;  coincidental to orthopedic case   Social History   Occupational History  . Occupation: disabled  Tobacco Use  . Smoking status: Never Smoker  . Smokeless tobacco: Never Used  Vaping Use  . Vaping Use: Never used  Substance and Sexual Activity  . Alcohol use: Not  Currently  . Drug use: Not Currently    Comment: OTC ASA  . Sexual activity: Yes    Partners: Female

## 2020-08-29 ENCOUNTER — Inpatient Hospital Stay (HOSPITAL_COMMUNITY)
Admission: EM | Admit: 2020-08-29 | Discharge: 2020-09-01 | DRG: 638 | Disposition: A | Discharge status: Home / Self Care | Payer: Medicare HMO | Attending: Internal Medicine | Admitting: Internal Medicine

## 2020-08-29 ENCOUNTER — Other Ambulatory Visit: Payer: Self-pay

## 2020-08-29 ENCOUNTER — Encounter (HOSPITAL_COMMUNITY): Payer: Self-pay

## 2020-08-29 DIAGNOSIS — Z8673 Personal history of transient ischemic attack (TIA), and cerebral infarction without residual deficits: Secondary | ICD-10-CM

## 2020-08-29 DIAGNOSIS — Z818 Family history of other mental and behavioral disorders: Secondary | ICD-10-CM

## 2020-08-29 DIAGNOSIS — Z9119 Patient's noncompliance with other medical treatment and regimen: Secondary | ICD-10-CM

## 2020-08-29 DIAGNOSIS — I872 Venous insufficiency (chronic) (peripheral): Secondary | ICD-10-CM | POA: Diagnosis present

## 2020-08-29 DIAGNOSIS — Z7901 Long term (current) use of anticoagulants: Secondary | ICD-10-CM

## 2020-08-29 DIAGNOSIS — Z794 Long term (current) use of insulin: Secondary | ICD-10-CM

## 2020-08-29 DIAGNOSIS — Z8249 Family history of ischemic heart disease and other diseases of the circulatory system: Secondary | ICD-10-CM

## 2020-08-29 DIAGNOSIS — Z8261 Family history of arthritis: Secondary | ICD-10-CM

## 2020-08-29 DIAGNOSIS — N184 Chronic kidney disease, stage 4 (severe): Secondary | ICD-10-CM | POA: Diagnosis present

## 2020-08-29 DIAGNOSIS — L97929 Non-pressure chronic ulcer of unspecified part of left lower leg with unspecified severity: Secondary | ICD-10-CM | POA: Diagnosis present

## 2020-08-29 DIAGNOSIS — D631 Anemia in chronic kidney disease: Secondary | ICD-10-CM | POA: Diagnosis present

## 2020-08-29 DIAGNOSIS — Z7982 Long term (current) use of aspirin: Secondary | ICD-10-CM

## 2020-08-29 DIAGNOSIS — Z955 Presence of coronary angioplasty implant and graft: Secondary | ICD-10-CM

## 2020-08-29 DIAGNOSIS — B20 Human immunodeficiency virus [HIV] disease: Secondary | ICD-10-CM | POA: Diagnosis present

## 2020-08-29 DIAGNOSIS — E1165 Type 2 diabetes mellitus with hyperglycemia: Secondary | ICD-10-CM | POA: Diagnosis present

## 2020-08-29 DIAGNOSIS — Z20822 Contact with and (suspected) exposure to covid-19: Secondary | ICD-10-CM | POA: Diagnosis present

## 2020-08-29 DIAGNOSIS — E1122 Type 2 diabetes mellitus with diabetic chronic kidney disease: Secondary | ICD-10-CM | POA: Diagnosis present

## 2020-08-29 DIAGNOSIS — I252 Old myocardial infarction: Secondary | ICD-10-CM

## 2020-08-29 DIAGNOSIS — Z809 Family history of malignant neoplasm, unspecified: Secondary | ICD-10-CM

## 2020-08-29 DIAGNOSIS — R739 Hyperglycemia, unspecified: Secondary | ICD-10-CM | POA: Diagnosis not present

## 2020-08-29 DIAGNOSIS — E1169 Type 2 diabetes mellitus with other specified complication: Secondary | ICD-10-CM

## 2020-08-29 DIAGNOSIS — Z89511 Acquired absence of right leg below knee: Secondary | ICD-10-CM

## 2020-08-29 DIAGNOSIS — G8929 Other chronic pain: Secondary | ICD-10-CM | POA: Diagnosis present

## 2020-08-29 DIAGNOSIS — N179 Acute kidney failure, unspecified: Secondary | ICD-10-CM

## 2020-08-29 DIAGNOSIS — E785 Hyperlipidemia, unspecified: Secondary | ICD-10-CM | POA: Diagnosis present

## 2020-08-29 DIAGNOSIS — Z7902 Long term (current) use of antithrombotics/antiplatelets: Secondary | ICD-10-CM

## 2020-08-29 DIAGNOSIS — I10 Essential (primary) hypertension: Secondary | ICD-10-CM | POA: Diagnosis present

## 2020-08-29 DIAGNOSIS — E119 Type 2 diabetes mellitus without complications: Secondary | ICD-10-CM

## 2020-08-29 DIAGNOSIS — Z79899 Other long term (current) drug therapy: Secondary | ICD-10-CM

## 2020-08-29 DIAGNOSIS — I2510 Atherosclerotic heart disease of native coronary artery without angina pectoris: Secondary | ICD-10-CM | POA: Diagnosis present

## 2020-08-29 DIAGNOSIS — Z9114 Patient's other noncompliance with medication regimen: Secondary | ICD-10-CM

## 2020-08-29 DIAGNOSIS — Z882 Allergy status to sulfonamides status: Secondary | ICD-10-CM

## 2020-08-29 DIAGNOSIS — I129 Hypertensive chronic kidney disease with stage 1 through stage 4 chronic kidney disease, or unspecified chronic kidney disease: Secondary | ICD-10-CM | POA: Diagnosis present

## 2020-08-29 LAB — BASIC METABOLIC PANEL
Anion gap: 9 (ref 5–15)
Anion gap: 8 (ref 5–15)
Anion gap: 15 (ref 5–15)
Anion gap: 11 (ref 5–15)
Anion gap: 12 (ref 5–15)
Anion gap: 7 (ref 5–15)
BUN: 51 mg/dL — ABNORMAL HIGH (ref 6–20)
BUN: 53 mg/dL — ABNORMAL HIGH (ref 6–20)
BUN: 56 mg/dL — ABNORMAL HIGH (ref 6–20)
BUN: 52 mg/dL — ABNORMAL HIGH (ref 6–20)
BUN: 50 mg/dL — ABNORMAL HIGH (ref 6–20)
BUN: 48 mg/dL — ABNORMAL HIGH (ref 6–20)
CO2: 24 mmol/L (ref 22–32)
CO2: 21 mmol/L — ABNORMAL LOW (ref 22–32)
CO2: 13 mmol/L — ABNORMAL LOW (ref 22–32)
CO2: 20 mmol/L — ABNORMAL LOW (ref 22–32)
CO2: 16 mmol/L — ABNORMAL LOW (ref 22–32)
CO2: 20 mmol/L — ABNORMAL LOW (ref 22–32)
Calcium: 9.2 mg/dL (ref 8.9–10.3)
Calcium: 8.6 mg/dL — ABNORMAL LOW (ref 8.9–10.3)
Calcium: 9.3 mg/dL (ref 8.9–10.3)
Calcium: 8.8 mg/dL — ABNORMAL LOW (ref 8.9–10.3)
Calcium: 8.8 mg/dL — ABNORMAL LOW (ref 8.9–10.3)
Calcium: 8.9 mg/dL (ref 8.9–10.3)
Chloride: 92 mmol/L — ABNORMAL LOW (ref 98–111)
Chloride: 96 mmol/L — ABNORMAL LOW (ref 98–111)
Chloride: 106 mmol/L (ref 98–111)
Chloride: 100 mmol/L (ref 98–111)
Chloride: 101 mmol/L (ref 98–111)
Chloride: 104 mmol/L (ref 98–111)
Creatinine, Ser: 3.07 mg/dL — ABNORMAL HIGH (ref 0.61–1.24)
Creatinine, Ser: 2.76 mg/dL — ABNORMAL HIGH (ref 0.61–1.24)
Creatinine, Ser: 2.82 mg/dL — ABNORMAL HIGH (ref 0.61–1.24)
Creatinine, Ser: 2.61 mg/dL — ABNORMAL HIGH (ref 0.61–1.24)
Creatinine, Ser: 2.54 mg/dL — ABNORMAL HIGH (ref 0.61–1.24)
Creatinine, Ser: 2.48 mg/dL — ABNORMAL HIGH (ref 0.61–1.24)
GFR, Estimated: 23 mL/min — ABNORMAL LOW (ref >60)
GFR, Estimated: 26 mL/min — ABNORMAL LOW (ref >60)
GFR, Estimated: 26 mL/min — ABNORMAL LOW (ref >60)
GFR, Estimated: 28 mL/min — ABNORMAL LOW (ref >60)
GFR, Estimated: 29 mL/min — ABNORMAL LOW (ref >60)
GFR, Estimated: 30 mL/min — ABNORMAL LOW (ref >60)
Glucose, Bld: 701 mg/dL (ref 70–99)
Glucose, Bld: 645 mg/dL (ref 70–99)
Glucose, Bld: 223 mg/dL — ABNORMAL HIGH (ref 70–99)
Glucose, Bld: 380 mg/dL — ABNORMAL HIGH (ref 70–99)
Glucose, Bld: 383 mg/dL — ABNORMAL HIGH (ref 70–99)
Glucose, Bld: 298 mg/dL — ABNORMAL HIGH (ref 70–99)
Potassium: 5 mmol/L (ref 3.5–5.1)
Potassium: 4.8 mmol/L (ref 3.5–5.1)
Potassium: 4.3 mmol/L (ref 3.5–5.1)
Potassium: 4.8 mmol/L (ref 3.5–5.1)
Potassium: 4.6 mmol/L (ref 3.5–5.1)
Potassium: 4.1 mmol/L (ref 3.5–5.1)
Sodium: 125 mmol/L — ABNORMAL LOW (ref 135–145)
Sodium: 125 mmol/L — ABNORMAL LOW (ref 135–145)
Sodium: 134 mmol/L — ABNORMAL LOW (ref 135–145)
Sodium: 131 mmol/L — ABNORMAL LOW (ref 135–145)
Sodium: 129 mmol/L — ABNORMAL LOW (ref 135–145)
Sodium: 131 mmol/L — ABNORMAL LOW (ref 135–145)

## 2020-08-29 LAB — I-STAT VENOUS BLOOD GAS, ED
Acid-Base Excess: 0 mmol/L (ref 0.0–2.0)
Bicarbonate: 26.4 mmol/L (ref 20.0–28.0)
Calcium, Ion: 1.17 mmol/L (ref 1.15–1.40)
HCT: 42 % (ref 39.0–52.0)
Hemoglobin: 14.3 g/dL (ref 13.0–17.0)
O2 Saturation: 51 %
Potassium: 5.1 mmol/L (ref 3.5–5.1)
Sodium: 127 mmol/L — ABNORMAL LOW (ref 135–145)
TCO2: 28 mmol/L (ref 22–32)
pCO2, Ven: 49 mmHg (ref 44.0–60.0)
pH, Ven: 7.339 (ref 7.250–7.430)
pO2, Ven: 29 mmHg — CL (ref 32.0–45.0)

## 2020-08-29 LAB — CBG MONITORING, ED
Glucose-Capillary: >600 mg/dL (ref 70–99)
Glucose-Capillary: >600 mg/dL (ref 70–99)
Glucose-Capillary: 542 mg/dL (ref 70–99)
Glucose-Capillary: 566 mg/dL (ref 70–99)
Glucose-Capillary: 442 mg/dL — ABNORMAL HIGH (ref 70–99)
Glucose-Capillary: 359 mg/dL — ABNORMAL HIGH (ref 70–99)
Glucose-Capillary: 285 mg/dL — ABNORMAL HIGH (ref 70–99)
Glucose-Capillary: 147 mg/dL — ABNORMAL HIGH (ref 70–99)
Glucose-Capillary: 335 mg/dL — ABNORMAL HIGH (ref 70–99)

## 2020-08-29 LAB — CBC
HCT: 37 % — ABNORMAL LOW (ref 39.0–52.0)
Hemoglobin: 12.3 g/dL — ABNORMAL LOW (ref 13.0–17.0)
MCH: 27.4 pg (ref 26.0–34.0)
MCHC: 33.2 g/dL (ref 30.0–36.0)
MCV: 82.4 fL (ref 80.0–100.0)
Platelets: 208 10*3/uL (ref 150–400)
RBC: 4.49 MIL/uL (ref 4.22–5.81)
RDW: 12.7 % (ref 11.5–15.5)
WBC: 6.3 10*3/uL (ref 4.0–10.5)
nRBC: 0 % (ref 0.0–0.2)

## 2020-08-29 LAB — CBC WITH DIFFERENTIAL/PLATELET
Abs Immature Granulocytes: 0.01 10*3/uL (ref 0.00–0.07)
Basophils Absolute: 0 10*3/uL (ref 0.0–0.1)
Basophils Relative: 1 %
Eosinophils Absolute: 0.1 10*3/uL (ref 0.0–0.5)
Eosinophils Relative: 2 %
HCT: 39.8 % (ref 39.0–52.0)
Hemoglobin: 12.9 g/dL — ABNORMAL LOW (ref 13.0–17.0)
Immature Granulocytes: 0 %
Lymphocytes Relative: 31 %
Lymphs Abs: 2.1 10*3/uL (ref 0.7–4.0)
MCH: 27.2 pg (ref 26.0–34.0)
MCHC: 32.4 g/dL (ref 30.0–36.0)
MCV: 84 fL (ref 80.0–100.0)
Monocytes Absolute: 0.5 10*3/uL (ref 0.1–1.0)
Monocytes Relative: 7 %
Neutro Abs: 4.1 10*3/uL (ref 1.7–7.7)
Neutrophils Relative %: 59 %
Platelets: 240 10*3/uL (ref 150–400)
RBC: 4.74 MIL/uL (ref 4.22–5.81)
RDW: 12.8 % (ref 11.5–15.5)
WBC: 6.8 10*3/uL (ref 4.0–10.5)
nRBC: 0 % (ref 0.0–0.2)

## 2020-08-29 LAB — SARS CORONAVIRUS 2 (TAT 6-24 HRS): SARS Coronavirus 2: NEGATIVE

## 2020-08-29 LAB — GLUCOSE, CAPILLARY
Glucose-Capillary: 401 mg/dL — ABNORMAL HIGH (ref 70–99)
Glucose-Capillary: 397 mg/dL — ABNORMAL HIGH (ref 70–99)

## 2020-08-29 LAB — URINALYSIS, ROUTINE W REFLEX MICROSCOPIC
Bacteria, UA: NONE SEEN
Bilirubin Urine: NEGATIVE
Glucose, UA: >=500 mg/dL — AB
Hgb urine dipstick: NEGATIVE
Ketones, ur: NEGATIVE mg/dL
Leukocytes,Ua: NEGATIVE
Nitrite: NEGATIVE
Protein, ur: 100 mg/dL — AB
Specific Gravity, Urine: 1.018 (ref 1.005–1.030)
pH: 6 (ref 5.0–8.0)

## 2020-08-29 LAB — HEMOGLOBIN A1C
Hgb A1c MFr Bld: 13.9 % — ABNORMAL HIGH (ref 4.8–5.6)
Mean Plasma Glucose: 352 mg/dL

## 2020-08-29 MED ORDER — INSULIN REGULAR(HUMAN) IN NACL 100-0.9 UT/100ML-% IV SOLN: INTRAVENOUS | Status: DC

## 2020-08-29 MED ORDER — INSULIN ASPART 100 UNIT/ML IJ SOLN: 6.0000 [IU] | Freq: Three times a day (TID) | INTRAMUSCULAR | Status: DC

## 2020-08-29 MED ORDER — INSULIN DETEMIR 100 UNIT/ML ~~LOC~~ SOLN
20.0000 [IU] | Freq: Every day | SUBCUTANEOUS | Status: DC
  Administered 2020-08-29: 20 [IU] via SUBCUTANEOUS
  Filled 2020-08-29: qty 0.2

## 2020-08-29 MED ORDER — DAPSONE 100 MG PO TABS
100.0000 mg | ORAL_TABLET | Freq: Every day | ORAL | Status: DC
  Administered 2020-08-29 – 2020-09-01 (×4): 100 mg via ORAL
  Filled 2020-08-29 (×4): qty 1

## 2020-08-29 MED ORDER — OXYCODONE HCL 5 MG PO TABS: 10.0000 mg | ORAL_TABLET | Freq: Four times a day (QID) | ORAL | Status: DC | PRN

## 2020-08-29 MED ORDER — LACTATED RINGERS IV SOLN: INTRAVENOUS | Status: DC

## 2020-08-29 MED ORDER — DEXTROSE 50 % IV SOLN
0.0000 mL | INTRAVENOUS | Status: DC | PRN
Start: 2020-08-29 — End: 2020-08-29

## 2020-08-29 MED ORDER — DEXTROSE IN LACTATED RINGERS 5 % IV SOLN: INTRAVENOUS | Status: DC

## 2020-08-29 MED ORDER — AMLODIPINE BESYLATE 10 MG PO TABS
10.0000 mg | ORAL_TABLET | Freq: Every day | ORAL | Status: DC
  Administered 2020-08-30 – 2020-09-01 (×3): 10 mg via ORAL
  Filled 2020-08-29: qty 2
  Filled 2020-08-29 (×3): qty 1

## 2020-08-29 MED ORDER — ICOSAPENT ETHYL 1 G PO CAPS
2.0000 g | ORAL_CAPSULE | Freq: Two times a day (BID) | ORAL | Status: DC
  Administered 2020-08-29 – 2020-09-01 (×7): 2 g via ORAL
  Filled 2020-08-29 (×9): qty 2

## 2020-08-29 MED ORDER — BICTEGRAVIR-EMTRICITAB-TENOFOV 50-200-25 MG PO TABS
1.0000 | ORAL_TABLET | Freq: Every day | ORAL | Status: DC
  Administered 2020-08-29 – 2020-09-01 (×4): 1 via ORAL
  Filled 2020-08-29 (×4): qty 1

## 2020-08-29 MED ORDER — ASPIRIN EC 81 MG PO TBEC
81.0000 mg | DELAYED_RELEASE_TABLET | Freq: Every day | ORAL | Status: DC
  Administered 2020-08-29 – 2020-09-01 (×4): 81 mg via ORAL
  Filled 2020-08-29 (×4): qty 1

## 2020-08-29 MED ORDER — INSULIN REGULAR(HUMAN) IN NACL 100-0.9 UT/100ML-% IV SOLN
INTRAVENOUS | Status: DC
  Administered 2020-08-29: 5 [IU]/h via INTRAVENOUS
  Filled 2020-08-29: qty 100

## 2020-08-29 MED ORDER — INSULIN ASPART 100 UNIT/ML IJ SOLN
0.0000 [IU] | Freq: Three times a day (TID) | INTRAMUSCULAR | Status: DC
  Administered 2020-08-29: 15 [IU] via SUBCUTANEOUS
  Administered 2020-08-30 (×2): 8 [IU] via SUBCUTANEOUS
  Administered 2020-08-30: 3 [IU] via SUBCUTANEOUS
  Administered 2020-08-31: 5 [IU] via SUBCUTANEOUS
  Administered 2020-08-31: 3 [IU] via SUBCUTANEOUS
  Administered 2020-08-31: 5 [IU] via SUBCUTANEOUS
  Administered 2020-09-01: 3 [IU] via SUBCUTANEOUS

## 2020-08-29 MED ORDER — ATORVASTATIN CALCIUM 80 MG PO TABS
80.0000 mg | ORAL_TABLET | Freq: Every day | ORAL | Status: DC
  Administered 2020-08-29 – 2020-08-31 (×3): 80 mg via ORAL
  Filled 2020-08-29 (×3): qty 1

## 2020-08-29 MED ORDER — SODIUM CHLORIDE 0.9 % IV BOLUS
500.0000 mL | Freq: Once | INTRAVENOUS | Status: AC
  Administered 2020-08-29: 500 mL via INTRAVENOUS

## 2020-08-29 MED ORDER — INSULIN ASPART 100 UNIT/ML IJ SOLN
0.0000 [IU] | Freq: Every day | INTRAMUSCULAR | Status: DC
  Administered 2020-08-29: 5 [IU] via SUBCUTANEOUS
  Administered 2020-08-31: 2 [IU] via SUBCUTANEOUS

## 2020-08-29 MED ORDER — SODIUM CHLORIDE 0.9 % IV BOLUS
1000.0000 mL | Freq: Once | INTRAVENOUS | Status: AC
  Administered 2020-08-29: 1000 mL via INTRAVENOUS

## 2020-08-29 MED ORDER — HEPARIN SODIUM (PORCINE) 5000 UNIT/ML IJ SOLN
5000.0000 [IU] | Freq: Three times a day (TID) | INTRAMUSCULAR | Status: DC
  Administered 2020-08-29 – 2020-08-31 (×7): 5000 [IU] via SUBCUTANEOUS
  Filled 2020-08-29 (×7): qty 1

## 2020-08-29 MED ORDER — INSULIN GLARGINE 100 UNIT/ML ~~LOC~~ SOLN
20.0000 [IU] | Freq: Two times a day (BID) | SUBCUTANEOUS | Status: DC
  Filled 2020-08-29 (×2): qty 0.2

## 2020-08-29 MED ORDER — CARVEDILOL 25 MG PO TABS
25.0000 mg | ORAL_TABLET | Freq: Two times a day (BID) | ORAL | Status: DC
  Administered 2020-08-29 – 2020-09-01 (×6): 25 mg via ORAL
  Filled 2020-08-29 (×5): qty 1
  Filled 2020-08-29: qty 2
  Filled 2020-08-29: qty 1

## 2020-08-29 MED ORDER — DEXTROSE 50 % IV SOLN: 0.0000 mL | INTRAVENOUS | Status: DC | PRN

## 2020-08-29 MED ORDER — TICAGRELOR 90 MG PO TABS
90.0000 mg | ORAL_TABLET | Freq: Two times a day (BID) | ORAL | Status: DC
  Administered 2020-08-29 – 2020-08-30 (×4): 90 mg via ORAL
  Administered 2020-08-31: 80 mg via ORAL
  Administered 2020-08-31 – 2020-09-01 (×2): 90 mg via ORAL
  Filled 2020-08-29 (×7): qty 1

## 2020-08-29 MED ORDER — LACTATED RINGERS IV SOLN
INTRAVENOUS | Status: DC
  Administered 2020-08-29: 125 mL via INTRAVENOUS

## 2020-08-29 MED ORDER — INSULIN ASPART 100 UNIT/ML IJ SOLN
10.0000 [IU] | Freq: Three times a day (TID) | INTRAMUSCULAR | Status: DC
  Administered 2020-08-29 – 2020-09-01 (×8): 10 [IU] via SUBCUTANEOUS

## 2020-08-29 NOTE — ED Notes (Signed)
No change recommended on endotool

## 2020-08-29 NOTE — Progress Notes (Signed)
Inpatient Diabetes Program Recommendations  AACE/ADA: New Consensus Statement on Inpatient Glycemic Control (2015)  Target Ranges:  Prepandial:   less than 140 mg/dL      Peak postprandial:   less than 180 mg/dL (1-2 hours)      Critically ill patients:  140 - 180 mg/dL   Lab Results  Component Value Date   GLUCAP 359 (H) 08/29/2020   HGBA1C 10.6 (H) 05/19/2020    Review of Glycemic Control Results for JAQUA, MEAR (MRN [name]) as of 08/29/2020 10:32  Ref. Range 08/29/2020 05:09 08/29/2020 06:29 08/29/2020 07:09 08/29/2020 08:32 08/29/2020 09:47  Glucose-Capillary Latest Ref Range: 70 - 99 mg/dL >600 (HH) 542 (HH) 566 (HH) 442 (H) 359 (H)   Diabetes history:  DM2 Outpatient Diabetes medications:  Lantus 20 units QHS Novolog 10 units TID with meals Metformin-dose unknown Current orders for Inpatient glycemic control:  IV insulin  Spoke with patient at bedside.  He verbalizes he take the above home DM medications.  He has been out of insulin for 1 week.  He states he tried to get a refill but his provider is no longer at the office and was unable to get a refill.  He has been "HI" all week.  He does not have a meter; he has been using his mother's glucometer.  Current A1C is pending.  Last A1C was 10.6% on 05/19/20.    He can afford his insulin-$0-$3.00 co-pay.   Will make recommendations for transitioning from IV insulin to SQ.    Please order for DC:  Glucometer-#[id number] Lantus Solostar [phone] Novolog Flexpen-#[id number]  Will continue to follow while inpatient.  Thank you, Reche Dixon, RN, BSN Diabetes Coordinator Inpatient Diabetes Program [phone] (team pager from 8a-5p)

## 2020-08-29 NOTE — ED Triage Notes (Signed)
Pt states that for the past two weeks his sugar has been running high, reports medication compliance

## 2020-08-29 NOTE — ED Provider Notes (Signed)
Emergency Medicine Provider Triage Evaluation Note  Keith [name] , a 55 y.o. male  was evaluated in triage.  Pt complains of hyperglycemia.  Patient reports blood sugars have been running high for the past 2 weeks.  He reports he ran out of his diabetes medication a month ago and has not been able to see a new primary care doctor, does have an appointment tomorrow.  Blood sugar meter reading high at home today.  No associated vomiting, fevers.  No chest pain or shortness of breath.  Review of Systems  Positive: Hyperglycemia Negative: Fevers, abdominal pain, chest pain, shortness of breath, vomiting  Physical Exam  BP (!) 129/98 (BP Location: Left Arm)   Pulse 81   Temp 97.8 F (36.6 C) (Oral)   Resp 19   SpO2 97%  Gen:   Awake, no distress  Resp:  Normal effort  MSK:   Moves extremities without difficulty, dressing and boot to left foot, right BKA with prosthetic Other:    Medical Decision Making  Medically screening exam initiated at 2:40 AM.  Appropriate orders placed.  Keith [name] was informed that the remainder of the evaluation will be completed by another provider, this initial triage assessment does not replace that evaluation, and the importance of remaining in the ED until their evaluation is complete.     Jacqlyn Larsen, PA-C 08/29/20 0241    Merryl Hacker, MD 08/29/20 [phone]

## 2020-08-29 NOTE — Progress Notes (Signed)
Patient arrived to unit with left leg wrapped for wound on calf. Per patient, it was wrapped by [name] and patient does not want it to be unwrapped at this time.

## 2020-08-29 NOTE — Progress Notes (Addendum)
Patient seen and examined personally, I reviewed the chart, history and physical and admission note, done by admitting physician this morning and agree with the same with following addendum.  Please refer to the morning admission note for more detailed plan of care.  Briefly, 55 year old male with history of HIV, recently stroke in January 2022, CAD status post stenting in December 21 T2DM on insulin,CKD stage IV who has ran out of insulin for past 2 weeks and has been running high blood sugar.He has a wound on left lower extremity and followed by [name]. He was seen in the ED found to have uncontrolled hyperglycemia not in  DKA and admitted on IV insulin drip Patient reports his primary doctor has left and he had no one to prescribe him insulin. Currently resting comfortably has no complaint blood pressure soft but asymptomatic. He feels hungry and wants to eat.  Issues:  Severe uncontrolled hyperglycemia due to noncompliance, not in DKA: Continue on current insulin regimen BMP stabilizing blood sugar much better, transition to subcu insulin  Lantus, premeal insulin and ssi achs.  A1c ispending Recent Labs  Lab 08/29/20 0629 08/29/20 0709 08/29/20 0832 08/29/20 0947 08/29/20 1049  GLUCAP 542* 566* 442* 359* 285*   Hyponatremia:combination of volume depletion as well as pseudohyponatremia from hyperglycemia monitor  Chronic CKD stage [id number] creatinine 2.2-2.9, AKI likely in the setting of DKA now improving with IV fluid hydration. Recent Labs  Lab 08/29/20 0229 08/29/20 0650  BUN 51* 53*  CREATININE 3.07* 2.76*  CAD history of stent on Brilinta aspirin statin and Coreg. HIV on Biktarvy and dapsone Stroke history on statin and antiplatelets Hypertension on Coreg and amlodipine,holding BP meds due to soft BP Chronic anemia monitor Recent Labs  Lab 08/29/20 0229 08/29/20 0305 08/29/20 0650  HGB 12.9* 14.3 12.3*  HCT 39.8 42.0 37.0*   Persistent ulceration of the left  leg with venous insufficiency followed by [name] on compression stocking due to be changed on Wednesday.  Wound care. Right BKA

## 2020-08-29 NOTE — ED Provider Notes (Signed)
[facility]ERGENCY DEPARTMENT Provider Note   CSN: [id number] Arrival date & time: 08/29/20  0158     History Chief Complaint  Patient presents with  . Hyperglycemia    Keith [name] is a 55 y.o. male.  HPI     This is a 55 year old male with a history of AIDS, coronary artery disease, CKD, diabetes, stroke who presents with hyperglycemia.  Patient reports that he has just been feeling bad lately.  Reports increased urinary output and increased thirst.  He reports his blood sugars have registered as high for greater than 2 weeks.  He tells me that he has been taking his "shots"; however, he endorsed to the medical screening provider that he had been out of his diabetes medications.  He does state that he has an appointment tomorrow but felt so bad he needs to come in.  He has not had any chest pain or shortness of breath.  No nausea, vomiting, fevers, upper respiratory symptoms.  Past Medical History:  Diagnosis Date  . Abscess of right foot    abscess/ulcer of R transtibial amputation requiring IV abx  . AIDS ([place])   . Anemia   . CAD (coronary artery disease)    a. MI with stenting of OM1 in 11/2018 with residual disease. b. acute STEMI 03/2020 s/p DES to OM1  . Chronic anemia   . Chronic knee pain    right  . Chronic pain   . CKD (chronic kidney disease), stage IV ([place])   . CVA (cerebral vascular accident) ([place]) 04/2020  . Diabetes type 2, uncontrolled ([place])    HgA1c 17.6 (04/27/2010)  . Diabetic foot ulcer ([place]) 01/2017   right foot  . Dilatation of aorta (HCC)   . Erectile dysfunction   . Genital warts   . HIV (human immunodeficiency virus infection) ([place]) 2009   CD4 count 100, VL 13800 (05/01/2010)  . Hyperlipidemia   . Hypertension   . Noncompliance with medication regimen   . Osteomyelitis ([place])    h/o hand  . Osteomyelitis of metatarsal ([place]) 04/28/2017    Patient Active Problem List   Diagnosis Date Noted  . Hyperglycemia due to  diabetes mellitus ([place]) 08/29/2020  . Acute cerebrovascular accident (CVA) due to ischemia ([name]) 04/28/2020  . Type 2 diabetes mellitus with hyperlipidemia ([place])   . PAD (peripheral artery disease) ([place])   . Hypoglycemia 04/25/2020  . Acute ST elevation myocardial infarction (STEMI) due to occlusion of circumflex coronary artery ([place]) 03/22/2020  . Uncontrolled type 2 diabetes mellitus with hyperglycemia ([place]) 01/14/2019  . Elevated glucose 01/14/2019  . Hx of BKA, right ([place]) 01/14/2019  . Pressure injury of skin 11/11/2018  . STEMI (ST elevation myocardial infarction) ([place]) 11/10/2018  . CKD (chronic kidney disease) stage 4, GFR 15-29 ml/min (HCC) 11/10/2018  . Amputation stump infection ([place]) 10/28/2018  . Type II diabetes mellitus with renal manifestations ([place]) 08/07/2018  . Uncontrolled type 2 diabetes mellitus with hyperosmolar nonketotic hyperglycemia ([place]) 08/07/2018  . Non-pressure chronic ulcer of right calf limited to breakdown of skin ([place]) 07/06/2018  . Type 2 diabetes mellitus without complication, without long-term current use of insulin ([place]) 06/15/2018  . Chronic low back pain without sciatica 06/15/2018  . Idiopathic chronic venous hypertension of left lower extremity with ulcer and inflammation ([place])   . Venous stasis ulcer of left calf limited to breakdown of skin without varicose veins ([place]) 04/23/2018  . Acquired absence of right leg below knee ([place]) 06/13/2017  .  Acute renal failure superimposed on stage 3 chronic kidney disease ([place]) 03/15/2017  . Diabetic polyneuropathy associated with type 2 diabetes mellitus ([place]) 01/23/2017  . AKI (acute kidney injury) ([place]) 09/28/2016  . Nausea vomiting and diarrhea 09/28/2016  . Onychomycosis of multiple toenails with type 2 diabetes mellitus ([place]) 08/29/2015  . MRSA carrier 04/20/2014  . Penile wart 02/22/2014  . HIV disease ([place])   . Insulin-requiring or dependent type II diabetes mellitus ([place]) 02/04/2014  . Dental  anomaly 11/20/2012  . Arthritis of right knee 02/23/2012  . Hyperlipidemia 11/10/2011  . Chronic pain 08/07/2011  . Meralgia paraesthetica 04/23/2011  . ERECTILE DYSFUNCTION 08/22/2008  . Essential hypertension 05/19/2008    Past Surgical History:  Procedure Laterality Date  . AMPUTATION Right 05/02/2017   Procedure: AMPUTATION TRANSMETARSAL;  Surgeon: [name], MD;  Location: [facility];  Service: Orthopedics;  Laterality: Right;  . AMPUTATION Right 06/13/2017   Procedure: RIGHT BELOW KNEE AMPUTATION;  Surgeon: [name], MD;  Location: [facility];  Service: Orthopedics;  Laterality: Right;  . BELOW KNEE LEG AMPUTATION Right 06/13/2017  . BELOW KNEE LEG AMPUTATION    . CORONARY STENT INTERVENTION N/A 11/10/2018   Procedure: CORONARY STENT INTERVENTION;  Surgeon: Leonie Man, MD;  Location: [facility];  Service: Cardiovascular;  Laterality: N/A;  . CORONARY/GRAFT ACUTE MI REVASCULARIZATION N/A 03/21/2020   Procedure: Coronary/Graft Acute MI Revascularization;  Surgeon: [name], MD;  Location: [facility];  Service: Cardiovascular;  Laterality: N/A;  . HERNIA REPAIR    . I & D EXTREMITY Left 08/21/2014   Procedure: INCISION AND DRAINAGE LEFT SMALL FINGER;  Surgeon: Leanora Cover, MD;  Location: [facility];  Service: Orthopedics;  Laterality: Left;  . I & D EXTREMITY Right 03/18/2017   Procedure: IRRIGATION AND DEBRIDEMENT EXTREMITY;  Surgeon: [name], MD;  Location: [facility];  Service: Orthopedics;  Laterality: Right;  . LEFT HEART CATH AND CORONARY ANGIOGRAPHY N/A 11/10/2018   Procedure: LEFT HEART CATH AND CORONARY ANGIOGRAPHY;  Surgeon: Leonie Man, MD;  Location: [facility];  Service: Cardiovascular;  Laterality: N/A;  . LEFT HEART CATH AND CORONARY ANGIOGRAPHY N/A 03/21/2020   Procedure: LEFT HEART CATH AND CORONARY ANGIOGRAPHY;  Surgeon: [name], MD;  Location: [facility];  Service: Cardiovascular;  Laterality: N/A;  . MINOR  IRRIGATION AND DEBRIDEMENT OF WOUND Right 04/22/2014   Procedure: IRRIGATION AND DEBRIDEMENT OF RIGHT NECK ABCESS;  Surgeon: Jerrell Belfast, MD;  Location: [facility];  Service: ENT;  Laterality: Right;  . MULTIPLE EXTRACTIONS WITH ALVEOLOPLASTY N/A 01/18/2013   Procedure: MULTIPLE EXTRACION 3, 6, 7, 10, 11, 13, 21, 22, 27, 28, 29, 30 WITH ALVEOLOPLASTY;  Surgeon: Gae Bon, DDS;  Location: [facility];  Service: Oral Surgery;  Laterality: N/A;  . SKIN SPLIT GRAFT Right 03/21/2017   Procedure: IRRIGATION AND DEBRIDEMENT RIGHT FOOT AND APPLY SPLIT THICKNESS SKIN GRAFT AND WOUND VAC;  Surgeon: [name], MD;  Location: [facility];  Service: Orthopedics;  Laterality: Right;  . TEE WITHOUT CARDIOVERSION N/A 05/02/2017   Procedure: TRANSESOPHAGEAL ECHOCARDIOGRAM (TEE);  Surgeon: Acie Fredrickson Wonda Cheng, MD;  Location: [facility];  Service: Cardiovascular;  Laterality: N/A;  coincidental to orthopedic case       Family History  Problem Relation Age of Onset  . Hypertension Mother   . Arthritis Father   . Hypertension Father   . Hypertension Brother   . Cancer Maternal Grandmother 57  unknown type of cancer  . Depression Paternal Grandmother     Social History   Tobacco Use  . Smoking status: Never Smoker  . Smokeless tobacco: Never Used  Vaping Use  . Vaping Use: Never used  Substance Use Topics  . Alcohol use: Not Currently  . Drug use: Not Currently    Comment: OTC ASA    Home Medications Prior to Admission medications   Medication Sig Start Date End Date Taking? Authorizing Provider  acetaminophen (TYLENOL) 325 MG tablet Take 2 tablets (650 mg total) by mouth every 4 (four) hours as needed for mild pain, fever or headache. 05/03/20   Debbe Odea, MD  amLODipine (NORVASC) 10 MG tablet Take 10 mg by mouth daily. 07/10/20   [provider]  aspirin 81 MG EC tablet Take 1 tablet (81 mg total) by mouth daily. 03/27/20   [name], MD  atorvastatin (LIPITOR) 80 MG tablet TAKE 1  TABLET (80 MG TOTAL) BY MOUTH DAILY AT 6 PM. Patient taking differently: Take by mouth daily. at 6pm 03/27/20 03/27/21  [name], MD  bictegravir-emtricitabine-tenofovir AF (BIKTARVY) 50-200-25 MG TABS tablet Take 1 tablet by mouth daily. 05/19/20   [name], MD  blood glucose meter kit and supplies Dispense based on patient and insurance preference. Use up to four times daily as directed. (FOR ICD-10 E10.9, E11.9). 07/17/20   [name], FNP  carvedilol (COREG) 25 MG tablet TAKE 1 TABLET (25 MG TOTAL) BY MOUTH TWO TIMES DAILY WITH A MEAL. PLEASE MAKE APPT FOR FUTURE REFILLS. [id number]. 1ST ATTEMPT. 03/27/20 03/27/21  [name], MD  clopidogrel (PLAVIX) 75 MG tablet Take 75 mg by mouth daily.    [provider]  dapsone 100 MG tablet TAKE 1 TABLET (100 MG TOTAL) BY MOUTH DAILY. 03/27/20 03/27/21  [name], Ava, DO  EASY COMFORT PEN NEEDLES 31G X 5 MM MISC INJECT 20 UNITS DAILY AS DIRECTED. 11/10/19   [name], FNP  gabapentin (NEURONTIN) 100 MG capsule TAKE 1 CAPSULE (100 MG TOTAL) BY MOUTH THREE TIMES DAILY. 03/28/20 03/28/21  [name], Ava, DO  glucose blood (COOL BLOOD GLUCOSE TEST STRIPS) test strip Use as instructed 02/16/20   [name], FNP  icosapent Ethyl (VASCEPA) 1 g capsule TAKE 2 CAPSULES (2 G TOTAL) BY MOUTH TWO TIMES DAILY. 03/27/20 03/27/21  [name], MD  insulin aspart (NOVOLOG) 100 UNIT/ML FlexPen INJECT 10 UNITS UNDER SKIN THREE TIMES A DAY, WITH MEALS. Patient not taking: Reported on 07/10/2020 03/27/20 03/27/21  [name], Ava, DO  insulin detemir (LEVEMIR) 100 UNIT/ML FlexPen INJECT 50 UNITS INTO THE SKIN DAILY. 03/27/20 03/27/21  [name], Ava, DO  insulin glargine (LANTUS) 100 UNIT/ML injection Inject 42 Units into the skin at bedtime.    [provider]  Insulin Pen Needle 32G X 4 MM MISC USE AS DIRECTED FOUR TIMES DAILY Patient taking differently: 4 (four) times daily. as directed 03/27/20 03/27/21  [name],  Ava, DO  Insulin Syringes, Disposable, U-100 0.3 ML MISC 1 Syringe by Does not apply route 3 (three) times daily. 02/16/20   [name], FNP  Lancets [facility] ULTRASOFT) lancets Use as instructed 02/16/20   [name], FNP  LEVEMIR 100 UNIT/ML injection Inject 0.4 mLs (40 Units total) into the skin daily. 05/03/20 12/24/21  Debbe Odea, MD  nitroGLYCERIN (NITROSTAT) 0.4 MG SL tablet PLACE 1 TABLET (0.4 MG TOTAL) UNDER THE TONGUE EVERY FIVE MINUTES X 3 DOSES AS NEEDED FOR CHEST PAIN. 03/27/20  03/27/21  [name], Ava, DO  omega-3 acid ethyl esters (LOVAZA) 1 g capsule Take 1 g by mouth 2 (two) times daily.    [provider]  oxybutynin (DITROPAN) 5 MG tablet TAKE 1 TABLET (5 MG TOTAL) BY MOUTH TWO TIMES DAILY. 03/27/20 03/27/21  [name], MD  oxyCODONE 10 MG TABS Take 1 tablet (10 mg total) by mouth every 6 (six) hours as needed for severe pain (Severe pain.). 05/03/20   Debbe Odea, MD  ticagrelor (BRILINTA) 90 MG TABS tablet T0KE 1 TABLET (90 MG TOTAL) BY MOUTH TWO TIMES DAILY. 03/27/20 03/27/21  [name], Ava, DO    Allergies    Elemental sulfur and Sulfa antibiotics  Review of Systems   Review of Systems  Constitutional: Negative for fever.  Respiratory: Negative for shortness of breath.   Cardiovascular: Negative for chest pain.  Gastrointestinal: Negative for abdominal pain, diarrhea and nausea.  Endocrine: Positive for polydipsia and polyuria.  Genitourinary: Negative for dysuria.  All other systems reviewed and are negative.   Physical Exam Updated Vital Signs BP (!) 134/91   Pulse 72   Temp 97.8 F (36.6 C) (Oral)   Resp 15   SpO2 100%   Physical Exam Vitals and nursing note reviewed.  Constitutional:      Appearance: He is well-developed. He is obese. He is not ill-appearing.  HENT:     Head: Normocephalic and atraumatic.     Mouth/Throat:     Mouth: Mucous membranes are moist.  Eyes:     Pupils: Pupils are equal, round, and reactive  to light.  Cardiovascular:     Rate and Rhythm: Normal rate and regular rhythm.     Heart sounds: Normal heart sounds. No murmur heard.   Pulmonary:     Effort: Pulmonary effort is normal. No respiratory distress.     Breath sounds: Normal breath sounds. No wheezing.  Abdominal:     General: Bowel sounds are normal.     Palpations: Abdomen is soft.     Tenderness: There is no abdominal tenderness. There is no rebound.  Musculoskeletal:     Cervical back: Neck supple.     Comments: Right BKA  Lymphadenopathy:     Cervical: No cervical adenopathy.  Skin:    General: Skin is warm and dry.  Neurological:     Mental Status: He is alert and oriented to person, place, and time.  Psychiatric:        Mood and Affect: Mood normal.     ED Results / Procedures / Treatments   Labs (all labs ordered are listed, but only abnormal results are displayed) Labs Reviewed  BASIC METABOLIC PANEL - Abnormal; Notable for the following components:      Result Value   Sodium 125 (*)    Chloride 92 (*)    Glucose, Bld 701 (*)    BUN 51 (*)    Creatinine, Ser 3.07 (*)    GFR, Estimated 23 (*)    All other components within normal limits  CBC WITH DIFFERENTIAL/PLATELET - Abnormal; Notable for the following components:   Hemoglobin 12.9 (*)    All other components within normal limits  URINALYSIS, ROUTINE W REFLEX MICROSCOPIC - Abnormal; Notable for the following components:   Color, Urine STRAW (*)    Glucose, UA >=500 (*)    Protein, ur 100 (*)    All other components within normal limits  CBG MONITORING, ED - Abnormal; Notable for the following components:   Glucose-Capillary >  600 (*)    All other components within normal limits  I-STAT VENOUS BLOOD GAS, ED - Abnormal; Notable for the following components:   pO2, Ven 29.0 (*)    Sodium 127 (*)    All other components within normal limits  CBG MONITORING, ED - Abnormal; Notable for the following components:   Glucose-Capillary >600 (*)     All other components within normal limits  SARS CORONAVIRUS 2 (TAT 6-24 HRS)    EKG EKG Interpretation  Date/Time:  Tuesday Aug 29 2020 05:07:51 EDT Ventricular Rate:  68 PR Interval:  178 QRS Duration: 120 QT Interval:  421 QTC Calculation: 448 R Axis:   -42 Text Interpretation: Sinus rhythm Nonspecific IVCD with LAD Inferolateral infarct, old Minimal ST elevation, anterior leads No significant change since last tracing Confirmed by Thayer Jew [phone]) on 08/29/2020 5:21:35 AM   Radiology No results found.  Procedures .Critical Care Performed by: Merryl Hacker, MD Authorized by: Merryl Hacker, MD   Critical care provider statement:    Critical care time (minutes):  45   Critical care was necessary to treat or prevent imminent or life-threatening deterioration of the following conditions:  Endocrine crisis   Critical care was time spent personally by me on the following activities:  Discussions with consultants, evaluation of patient's response to treatment, examination of patient, ordering and performing treatments and interventions, ordering and review of laboratory studies, ordering and review of radiographic studies, pulse oximetry, re-evaluation of patient's condition, obtaining history from patient or surrogate and review of old charts     Medications Ordered in ED Medications  insulin regular, human (MYXREDLIN) 100 units/ 100 mL infusion (has no administration in time range)  lactated ringers infusion (has no administration in time range)  dextrose 5 % in lactated ringers infusion (has no administration in time range)  dextrose 50 % solution 0-50 mL (has no administration in time range)  sodium chloride 0.9 % bolus 1,000 mL (1,000 mLs Intravenous New Bag/Given 08/29/20 0511)    ED Course  I have reviewed the triage vital signs and the nursing notes.  Pertinent labs & imaging results that were available during my care of the patient were reviewed by  me and considered in my medical decision making (see chart for details).    MDM Rules/Calculators/A&P                          Patient presents with elevated blood sugars from home.  It is unclear whether he is fully compliant with his diabetes medication.  States he generally has not felt well and has symptoms of hyperglycemia such as polydipsia and polyuria.  Denies chest pain or ischemic symptoms.  EKG without acute ischemic changes.  Denies any recent illnesses.  I have sent COVID testing.  Work-up notable for blood sugar of 701 with a pseudohyponatremia to 125.  Also notably his creatinine is elevated at 3.04.  Most recent creatinine 1.77.  It appears his baseline is around 2.  Patient was given a liter of fluids and started on Endo tool.  Given his metabolic derangements, feel he will need admission for hyperglycemic control.  Suspect medication noncompliance.  No evidence of DKA at this time.  Final Clinical Impression(s) / ED Diagnoses Final diagnoses:  Hyperglycemia  AKI (acute kidney injury) [facility])    Rx / DC Orders ED Discharge Orders    None       Lilla Callejo, Barbette Hair, MD 08/29/20  0523  

## 2020-08-29 NOTE — H&P (Addendum)
History and Physical    Keith [name] OZH:[id number] DOB: Aug 10, 1965 DOA: 08/29/2020  PCP: No primary care provider on file.   Patient coming from: Home.  Chief Complaint: Elevated blood sugar.  HPI: Keith [name] is a 55 y.o. male with history of HIV, recent stroke in January 2022, history of CAD status post stenting in December 2021, diabetes mellitus type 2, chronic kidney disease stage IV presents to the ER because patient states his blood sugar has been running high over the last 2 weeks.  Patient states he has ran out of his Levemir for the last few days.  Denies chest pain shortness of breath nausea vomiting or diarrhea.  Denies fever chills.  ED Course: In the ER patient's labs show blood sugar of 701 with anion gap of 9.  Creatinine is 3.07.  Hemoglobin 12.9 COVID test pending.  Patient was started on fluids and insulin infusion admitted for uncontrolled diabetes mellitus type 2.  Review of Systems: As per HPI, rest all negative.   Past Medical History:  Diagnosis Date  . Abscess of right foot    abscess/ulcer of R transtibial amputation requiring IV abx  . AIDS ([place])   . Anemia   . CAD (coronary artery disease)    a. MI with stenting of OM1 in 11/2018 with residual disease. b. acute STEMI 03/2020 s/p DES to OM1  . Chronic anemia   . Chronic knee pain    right  . Chronic pain   . CKD (chronic kidney disease), stage IV ([place])   . CVA (cerebral vascular accident) ([place]) 04/2020  . Diabetes type 2, uncontrolled ([place])    HgA1c 17.6 (04/27/2010)  . Diabetic foot ulcer ([place]) 01/2017   right foot  . Dilatation of aorta ([facility])   . Erectile dysfunction   . Genital warts   . HIV (human immunodeficiency virus infection) ([place]) 2009   CD4 count 100, VL 13800 (05/01/2010)  . Hyperlipidemia   . Hypertension   . Noncompliance with medication regimen   . Osteomyelitis ([place])    h/o hand  . Osteomyelitis of metatarsal ([place]) 04/28/2017    Past Surgical History:  Procedure  Laterality Date  . AMPUTATION Right 05/02/2017   Procedure: AMPUTATION TRANSMETARSAL;  Surgeon: [name], MD;  Location: [place];  Service: Orthopedics;  Laterality: Right;  . AMPUTATION Right 06/13/2017   Procedure: RIGHT BELOW KNEE AMPUTATION;  Surgeon: [name], MD;  Location: [place];  Service: Orthopedics;  Laterality: Right;  . BELOW KNEE LEG AMPUTATION Right 06/13/2017  . BELOW KNEE LEG AMPUTATION    . CORONARY STENT INTERVENTION N/A 11/10/2018   Procedure: CORONARY STENT INTERVENTION;  Surgeon: Leonie Man, MD;  Location: [place] [facility];  Service: Cardiovascular;  Laterality: N/A;  . CORONARY/GRAFT ACUTE MI REVASCULARIZATION N/A 03/21/2020   Procedure: Coronary/Graft Acute MI Revascularization;  Surgeon: [name], MD;  Location: [place] [facility];  Service: Cardiovascular;  Laterality: N/A;  . HERNIA REPAIR    . I & D EXTREMITY Left 08/21/2014   Procedure: INCISION AND DRAINAGE LEFT SMALL FINGER;  Surgeon: Leanora Cover, MD;  Location: [place];  Service: Orthopedics;  Laterality: Left;  . I & D EXTREMITY Right 03/18/2017   Procedure: IRRIGATION AND DEBRIDEMENT EXTREMITY;  Surgeon: [name], MD;  Location: [place];  Service: Orthopedics;  Laterality: Right;  . LEFT HEART CATH AND CORONARY ANGIOGRAPHY N/A 11/10/2018   Procedure: LEFT HEART CATH AND CORONARY ANGIOGRAPHY;  Surgeon: Glenetta Hew  W, MD;  Location: [place] [facility];  Service: Cardiovascular;  Laterality: N/A;  . LEFT HEART CATH AND CORONARY ANGIOGRAPHY N/A 03/21/2020   Procedure: LEFT HEART CATH AND CORONARY ANGIOGRAPHY;  Surgeon: [name], MD;  Location: [facility];  Service: Cardiovascular;  Laterality: N/A;  . MINOR IRRIGATION AND DEBRIDEMENT OF WOUND Right 04/22/2014   Procedure: IRRIGATION AND DEBRIDEMENT OF RIGHT NECK ABCESS;  Surgeon: Jerrell Belfast, MD;  Location: [place];  Service: ENT;  Laterality: Right;  . MULTIPLE EXTRACTIONS WITH ALVEOLOPLASTY N/A 01/18/2013   Procedure:  MULTIPLE EXTRACION 3, 6, 7, 10, 11, 13, 21, 22, 27, 28, 29, 30 WITH ALVEOLOPLASTY;  Surgeon: Gae Bon, DDS;  Location: [place];  Service: Oral Surgery;  Laterality: N/A;  . SKIN SPLIT GRAFT Right 03/21/2017   Procedure: IRRIGATION AND DEBRIDEMENT RIGHT FOOT AND APPLY SPLIT THICKNESS SKIN GRAFT AND WOUND VAC;  Surgeon: [name], MD;  Location: [place];  Service: Orthopedics;  Laterality: Right;  . TEE WITHOUT CARDIOVERSION N/A 05/02/2017   Procedure: TRANSESOPHAGEAL ECHOCARDIOGRAM (TEE);  Surgeon: Acie Fredrickson Wonda Cheng, MD;  Location: [facility] OR;  Service: Cardiovascular;  Laterality: N/A;  coincidental to orthopedic case     reports that he has never smoked. He has never used smokeless tobacco. He reports previous alcohol use. He reports previous drug use.  Allergies  Allergen Reactions  . Elemental Sulfur Itching    Patient stated he's allergic to "sulfur" AND "sulfa"  . Sulfa Antibiotics Itching    Family History  Problem Relation Age of Onset  . Hypertension Mother   . Arthritis Father   . Hypertension Father   . Hypertension Brother   . Cancer Maternal Grandmother 68       unknown type of cancer  . Depression Paternal Grandmother     Prior to Admission medications   Medication Sig Start Date End Date Taking? Authorizing Provider  acetaminophen (TYLENOL) 325 MG tablet Take 2 tablets (650 mg total) by mouth every 4 (four) hours as needed for mild pain, fever or headache. 05/03/20   Debbe Odea, MD  amLODipine (NORVASC) 10 MG tablet Take 10 mg by mouth daily. 07/10/20   [provider]  aspirin 81 MG EC tablet Take 1 tablet (81 mg total) by mouth daily. 03/27/20   [name], MD  atorvastatin (LIPITOR) 80 MG tablet TAKE 1 TABLET (80 MG TOTAL) BY MOUTH DAILY AT 6 PM. Patient taking differently: Take by mouth daily. at 6pm 03/27/20 03/27/21  [name], MD  bictegravir-emtricitabine-tenofovir AF (BIKTARVY) 50-200-25 MG TABS tablet Take 1 tablet by mouth daily.  05/19/20   [name], MD  blood glucose meter kit and supplies Dispense based on patient and insurance preference. Use up to four times daily as directed. (FOR ICD-10 E10.9, E11.9). 07/17/20   [name], FNP  carvedilol (COREG) 25 MG tablet TAKE 1 TABLET (25 MG TOTAL) BY MOUTH TWO TIMES DAILY WITH A MEAL. PLEASE MAKE APPT FOR FUTURE REFILLS. [phone]. 1ST ATTEMPT. 03/27/20 03/27/21  [name], MD  clopidogrel (PLAVIX) 75 MG tablet Take 75 mg by mouth daily.    [provider]  dapsone 100 MG tablet TAKE 1 TABLET (100 MG TOTAL) BY MOUTH DAILY. 03/27/20 03/27/21  [name], Ava, DO  EASY COMFORT PEN NEEDLES 31G X 5 MM MISC INJECT 20 UNITS DAILY AS DIRECTED. 11/10/19   [name], FNP  gabapentin (NEURONTIN) 100 MG capsule TAKE 1 CAPSULE (100 MG TOTAL) BY MOUTH  THREE TIMES DAILY. 03/28/20 03/28/21  [name], Ava, DO  glucose blood (COOL BLOOD GLUCOSE TEST STRIPS) test strip Use as instructed 02/16/20   [name], FNP  icosapent Ethyl (VASCEPA) 1 g capsule TAKE 2 CAPSULES (2 G TOTAL) BY MOUTH TWO TIMES DAILY. 03/27/20 03/27/21  [name], MD  insulin aspart (NOVOLOG) 100 UNIT/ML FlexPen INJECT 10 UNITS UNDER SKIN THREE TIMES A DAY, WITH MEALS. Patient not taking: Reported on 07/10/2020 03/27/20 03/27/21  [name], Ava, DO  insulin detemir (LEVEMIR) 100 UNIT/ML FlexPen INJECT 50 UNITS INTO THE SKIN DAILY. 03/27/20 03/27/21  [name], Ava, DO  insulin glargine (LANTUS) 100 UNIT/ML injection Inject 42 Units into the skin at bedtime.    [provider]  Insulin Pen Needle 32G X 4 MM MISC USE AS DIRECTED FOUR TIMES DAILY Patient taking differently: 4 (four) times daily. as directed 03/27/20 03/27/21  [name], Ava, DO  Insulin Syringes, Disposable, U-100 0.3 ML MISC 1 Syringe by Does not apply route 3 (three) times daily. 02/16/20   [name], FNP  Lancets [facility] ULTRASOFT) lancets Use as instructed 02/16/20   [name], FNP  LEVEMIR 100  UNIT/ML injection Inject 0.4 mLs (40 Units total) into the skin daily. 05/03/20 12/24/21  Debbe Odea, MD  nitroGLYCERIN (NITROSTAT) 0.4 MG SL tablet PLACE 1 TABLET (0.4 MG TOTAL) UNDER THE TONGUE EVERY FIVE MINUTES X 3 DOSES AS NEEDED FOR CHEST PAIN. 03/27/20 03/27/21  [name], Ava, DO  omega-3 acid ethyl esters (LOVAZA) 1 g capsule Take 1 g by mouth 2 (two) times daily.    [provider]  oxybutynin (DITROPAN) 5 MG tablet TAKE 1 TABLET (5 MG TOTAL) BY MOUTH TWO TIMES DAILY. 03/27/20 03/27/21  [name], MD  oxyCODONE 10 MG TABS Take 1 tablet (10 mg total) by mouth every 6 (six) hours as needed for severe pain (Severe pain.). 05/03/20   Debbe Odea, MD  ticagrelor (BRILINTA) 90 MG TABS tablet T0KE 1 TABLET (90 MG TOTAL) BY MOUTH TWO TIMES DAILY. 03/27/20 03/27/21  [name], Ava, DO    Physical Exam: Constitutional: Moderately built and nourished. Vitals:   08/29/20 0203 08/29/20 0500  BP: (!) 129/98 (!) 134/91  Pulse: 81 72  Resp: 19 15  Temp: 97.8 F (36.6 C)   TempSrc: Oral   SpO2: 97% 100%   Eyes: Anicteric no pallor. ENMT: No discharge from the ears eyes nose and mouth. Neck: No mass felt.  No neck rigidity. Respiratory: No rhonchi or crepitations. Cardiovascular: S1-S2 heard. Abdomen: Soft nontender bowel sounds present. Musculoskeletal: No edema.  Left leg has dressing.  Right BKA. Skin: Left leg is in dressing. Neurologic: Alert awake oriented to place time and person.  Moves all extremities. Psychiatric: Appears normal.   Labs on Admission: I have personally reviewed following labs and imaging studies  CBC: Recent Labs  Lab 08/29/20 0229 08/29/20 0305  WBC 6.8  --   NEUTROABS 4.1  --   HGB 12.9* 14.3  HCT 39.8 42.0  MCV 84.0  --   PLT 240  --    Basic Metabolic Panel: Recent Labs  Lab 08/29/20 0229 08/29/20 0305  NA 125* 127*  K 5.0 5.1  CL 92*  --   CO2 24  --   GLUCOSE 701*  --   BUN 51*  --   CREATININE 3.07*  --   CALCIUM 9.2  --     GFR: CrCl cannot be calculated (Unknown ideal weight.). Liver Function Tests: No results for  input(s): AST, ALT, ALKPHOS, BILITOT, PROT, ALBUMIN in the last 168 hours. No results for input(s): LIPASE, AMYLASE in the last 168 hours. No results for input(s): AMMONIA in the last 168 hours. Coagulation Profile: No results for input(s): INR, PROTIME in the last 168 hours. Cardiac Enzymes: No results for input(s): CKTOTAL, CKMB, CKMBINDEX, TROPONINI in the last 168 hours. BNP (last 3 results) No results for input(s): PROBNP in the last 8760 hours. HbA1C: No results for input(s): HGBA1C in the last 72 hours. CBG: Recent Labs  Lab 08/29/20 0202 08/29/20 0509  GLUCAP >600* >600*   Lipid Profile: No results for input(s): CHOL, HDL, LDLCALC, TRIG, CHOLHDL, LDLDIRECT in the last 72 hours. Thyroid Function Tests: No results for input(s): TSH, T4TOTAL, FREET4, T3FREE, THYROIDAB in the last 72 hours. Anemia Panel: No results for input(s): VITAMINB12, FOLATE, FERRITIN, TIBC, IRON, RETICCTPCT in the last 72 hours. Urine analysis:    Component Value Date/Time   COLORURINE STRAW (A) 08/29/2020 0234   APPEARANCEUR CLEAR 08/29/2020 0234   LABSPEC 1.018 08/29/2020 0234   PHURINE 6.0 08/29/2020 0234   GLUCOSEU >=500 (A) 08/29/2020 0234   HGBUR NEGATIVE 08/29/2020 0234   BILIRUBINUR NEGATIVE 08/29/2020 0234   BILIRUBINUR negative 09/07/2018 1034   KETONESUR NEGATIVE 08/29/2020 0234   PROTEINUR 100 (A) 08/29/2020 0234   UROBILINOGEN 1.0 09/07/2018 1034   UROBILINOGEN 1.0 01/01/2017 1032   NITRITE NEGATIVE 08/29/2020 0234   LEUKOCYTESUR NEGATIVE 08/29/2020 0234   Sepsis Labs: _0 (procalcitonin:4,lacticidven:4) )No results found for this or any previous visit (from the past 240 hour(s)).   Radiological Exams on Admission: No results found.    Assessment/Plan Principal Problem:   Hyperglycemia due to diabetes mellitus (HCC) Active Problems:   Essential hypertension   HIV  disease (HCC)   CKD (chronic kidney disease) stage 4, GFR 15-29 ml/min (HCC)   Hyperglycemia    1. Severe hyperglycemia due to uncontrolled diabetes mellitus type 2 due to noncompliance with medication not in DKA presently on insulin infusion once blood sugar less than 250 will change to Levemir.  Closely follow metabolic panel.  Check hemoglobin A1c.  Last one about 3 months ago was around 10.5. 2. Chronic kidney disease stage IV with mildly increased creatinine from baseline baseline is around usually 2.2-2.9.  Presently is around 3.  Follow metabolic panel. 3. CAD status post stenting on Brilinta aspirin statins and Coreg.  Not sure if patient was compliant with his medication.  Denies any chest pain. 4. Hypertension on Coreg and amlodipine. 5. History of stroke in January 2022 recently.  On statins and antiplatelet agents. 6. HIV on Biktarvy and dapsone. 7. Chronic anemia follow CBC. 8. Persistent ulceration of the left leg with venous insufficiency being followed by [name] on compression stockings.  Medical wound team to evaluate. 9. Right BKA.  COVID test is pending.  Some of the home medications needs to be verified.   DVT prophylaxis: Heparin. Code Status: Full code. Family Communication: Discussed with patient. Disposition Plan: Home. Consults called: None. Admission status: Observation.   Rise Patience MD [facility] Pager [phone].  If 7PM-7AM, please contact night-coverage [email or web address] Password [facility]  08/29/2020, 6:24 AM

## 2020-08-30 DIAGNOSIS — E1165 Type 2 diabetes mellitus with hyperglycemia: Secondary | ICD-10-CM | POA: Diagnosis not present

## 2020-08-30 LAB — GLUCOSE, CAPILLARY
Glucose-Capillary: 272 mg/dL — ABNORMAL HIGH (ref 70–99)
Glucose-Capillary: 309 mg/dL — ABNORMAL HIGH (ref 70–99)
Glucose-Capillary: 297 mg/dL — ABNORMAL HIGH (ref 70–99)
Glucose-Capillary: 189 mg/dL — ABNORMAL HIGH (ref 70–99)
Glucose-Capillary: 130 mg/dL — ABNORMAL HIGH (ref 70–99)

## 2020-08-30 LAB — CBC
HCT: 37.8 % — ABNORMAL LOW (ref 39.0–52.0)
Hemoglobin: 12.5 g/dL — ABNORMAL LOW (ref 13.0–17.0)
MCH: 26.8 pg (ref 26.0–34.0)
MCHC: 33.1 g/dL (ref 30.0–36.0)
MCV: 81.1 fL (ref 80.0–100.0)
Platelets: 217 10*3/uL (ref 150–400)
RBC: 4.66 MIL/uL (ref 4.22–5.81)
RDW: 12.9 % (ref 11.5–15.5)
WBC: 6.2 10*3/uL (ref 4.0–10.5)
nRBC: 0 % (ref 0.0–0.2)

## 2020-08-30 LAB — BASIC METABOLIC PANEL
Anion gap: 8 (ref 5–15)
BUN: 47 mg/dL — ABNORMAL HIGH (ref 6–20)
CO2: 22 mmol/L (ref 22–32)
Calcium: 9.1 mg/dL (ref 8.9–10.3)
Chloride: 103 mmol/L (ref 98–111)
Creatinine, Ser: 2.51 mg/dL — ABNORMAL HIGH (ref 0.61–1.24)
GFR, Estimated: 29 mL/min — ABNORMAL LOW (ref >60)
Glucose, Bld: 240 mg/dL — ABNORMAL HIGH (ref 70–99)
Potassium: 4.2 mmol/L (ref 3.5–5.1)
Sodium: 133 mmol/L — ABNORMAL LOW (ref 135–145)

## 2020-08-30 MED ORDER — INSULIN GLARGINE 100 UNIT/ML ~~LOC~~ SOLN
10.0000 [IU] | SUBCUTANEOUS | Status: AC
  Administered 2020-08-30: 10 [IU] via SUBCUTANEOUS
  Filled 2020-08-30: qty 0.1

## 2020-08-30 MED ORDER — INSULIN ASPART 100 UNIT/ML IJ SOLN
10.0000 [IU] | Freq: Three times a day (TID) | INTRAMUSCULAR | Status: DC
  Administered 2020-08-30 (×2): 10 [IU] via SUBCUTANEOUS

## 2020-08-30 MED ORDER — INSULIN GLARGINE 100 UNIT/ML ~~LOC~~ SOLN
35.0000 [IU] | Freq: Every day | SUBCUTANEOUS | Status: DC
  Administered 2020-08-30 (×2): 35 [IU] via SUBCUTANEOUS
  Filled 2020-08-30: qty 0.35

## 2020-08-30 MED ORDER — INSULIN GLARGINE 100 UNIT/ML ~~LOC~~ SOLN
40.0000 [IU] | Freq: Every day | SUBCUTANEOUS | Status: DC
  Administered 2020-08-31 – 2020-09-01 (×2): 40 [IU] via SUBCUTANEOUS
  Filled 2020-08-30 (×2): qty 0.4

## 2020-08-30 NOTE — Progress Notes (Signed)
PROGRESS NOTE    Keith [name]  [id number] DOB: December 24, 1965 DOA: 08/29/2020 PCP: No primary care provider on file.   Chief Complaint  Patient presents with  . Hyperglycemia  Brief Narrative: 55 year old male with history of HIV, recently stroke in January 2022, CAD status post stenting in December 21 T2DM on insulin,CKD stage IV who has ran out of insulin for past 2 weeks and has been running high blood sugar.He has a wound on left lower extremity and followed by [name]. He was seen in the ED found to have uncontrolled hyperglycemia not in  DKA and admitted on IV insulin drip Patient reports his primary doctor has left and he had no one to prescribe him insulin   Subjective: Seen and examined this morning. Blood sugar remains uncontrolled Has no new complaints Afebrile.  Assessment & Plan:  Severe uncontrolled hyperglycemia due to noncompliance, not in DKA: Admitted on insulin drip, now transitioned to subcu insulin-increase Lantus to 14 units daily, give additional 10 units Lantus this morning, add Premeal NovoLog 10 units 3 times daily, continue sliding scale insulin, and monitor to adjust insulin further.hba1c poorly controlled at 13.9. Recent Labs  Lab 08/29/20 1807 08/29/20 2006 08/30/20 0802 08/30/20 0948 08/30/20 1159  GLUCAP 401* 397* 272* 309* 297*   Hyponatremia: combination of volume depletion as well as pseudohyponatremia from hyperglycemia , improving. Recent Labs  Lab 08/29/20 1132 08/29/20 1545 08/29/20 1836 08/29/20 2232 08/30/20 0554  NA 134* 131* 129* 131* 133*   Chronic CKD stage UK:[id number] creatinine 2.2-2.9, AKI likely in the setting of hyperglycemia,stop ivf, encourage oral hydration. Recent Labs  Lab 08/29/20 1132 08/29/20 1545 08/29/20 1836 08/29/20 2232 08/30/20 0554  BUN 56* 52* 50* 48* 47*  CREATININE 2.82* 2.61* 2.54* 2.48* 2.51*   CAD w/ history of stent no chest pain, continue Brilinta aspirin statin and Coreg.  HIV on  Biktarvy and dapsone  Stroke history- cont his statin and brilinta  Hypertension BP well controlled on Coreg and amlodipine,held in ed 2/2 soft BP.  Chronic anemia monitor Recent Labs  Lab 08/29/20 0229 08/29/20 0305 08/29/20 0650 08/30/20 0554  HGB 12.9* 14.3 12.3* 12.5*  HCT 39.8 42.0 37.0* 37.8*   Persistent ulceration of the left leg with venous insufficiency followed by [name] on compression stocking, being changed weekly administered as outpatient.  Continue wound care.  He does not want it to be changed reports he is going to follow-up with [name] 1-2 days.  Right BKA status.  Diet Order            Diet Carb Modified Fluid consistency: Thin; Room service appropriate? Yes  Diet effective now                Patient's There is no height or weight on file to calculate BMI.  DVT prophylaxis: heparin injection 5,000 Units Start: 08/29/20 0630 Code Status:   Code Status: Full Code  Family Communication: plan of care discussed with patient at bedside.  Status is: admitted as observation Remains hospitalized for ongoing management of hyperglycemia and further insulin adjustment  Dispo: The patient is from: Home              Anticipated d/c is to: Home              Patient currently is not medically stable to d/c.   Difficult to place patient No Unresulted Labs (From admission, onward)          Start  Ordered   08/31/20 [id number]  Basic metabolic panel  Daily,   R     Question:  Specimen collection method  Answer:  Lab=Lab collect   08/30/20 0733   08/30/20 0500  CBC  Daily,   R      08/29/20 0815        Medications reviewed:  Scheduled Meds: . amLODipine  10 mg Oral Daily  . aspirin EC  81 mg Oral Daily  . atorvastatin  80 mg Oral q1800  . bictegravir-emtricitabine-tenofovir AF  1 tablet Oral Daily  . carvedilol  25 mg Oral BID WC  . dapsone  100 mg Oral Daily  . heparin  5,000 Units Subcutaneous Q8H  . icosapent Ethyl  2 g Oral BID  . insulin aspart   0-15 Units Subcutaneous TID WC  . insulin aspart  0-5 Units Subcutaneous QHS  . insulin aspart  10 Units Subcutaneous TID WC  . insulin glargine  10 Units Subcutaneous STAT  . [START ON 08/31/2020] insulin glargine  40 Units Subcutaneous Daily  . ticagrelor  90 mg Oral BID   Continuous Infusions:   Consultants:see note  Procedures:see note  Antimicrobials: Anti-infectives (From admission, onward)   Start     Dose/Rate Route Frequency Ordered Stop   08/29/20 1000  bictegravir-emtricitabine-tenofovir AF (BIKTARVY) 50-200-25 MG per tablet 1 tablet        1 tablet Oral Daily 08/29/20 0624     08/29/20 1000  dapsone tablet 100 mg        100 mg Oral Daily 08/29/20 0624       Culture/Microbiology    Component Value Date/Time   SDES BLOOD LEFT ARM 11/03/2018 0615   SDES BLOOD LEFT ARM 11/03/2018 0615   SPECREQUEST  11/03/2018 0615    BOTTLES DRAWN AEROBIC ONLY Blood Culture adequate volume   SPECREQUEST  11/03/2018 0615    BOTTLES DRAWN AEROBIC ONLY Blood Culture results may not be optimal due to an inadequate volume of blood received in culture bottles   CULT  11/03/2018 0615    NO GROWTH 5 DAYS Performed at [facility], [place] [address], [place], [place] [address]    CULT  11/03/2018 0615    NO GROWTH 5 DAYS Performed at [facility] [address] [place], [place] [address]    REPTSTATUS 11/08/2018 FINAL 11/03/2018 0615   REPTSTATUS 11/08/2018 FINAL 11/03/2018 0615    Other culture-see note  Objective: Vitals: Today's Vitals   08/30/20 0313 08/30/20 0803 08/30/20 1100 08/30/20 1204  BP: 120/77 114/82  103/71  Pulse: 62 72  71  Resp: '18 14  16  '$ Temp: 97.7 F (36.5 C) (!) 97.4 F (36.3 C)  97.8 F (36.6 C)  TempSrc: Oral Oral  Oral  SpO2: 98% 99%  98%  PainSc: 0-No pain  0-No pain     Intake/Output Summary (Last 24 hours) at 08/30/2020 1235 Last data filed at 08/30/2020 0710 Gross per 24 hour  Intake 240 ml  Output 850 ml  Net -610 ml   There were  no vitals filed for this visit. Weight change:   Intake/Output from previous day: 05/24 0701 - 05/25 0700 In: 240 [P.O.:240] Out: 400 [Urine:400] Intake/Output this shift: Total I/O In: -  Out: 450 [Urine:450] There were no vitals filed for this visit.  Examination: General exam: AAO x3 , older than stated age, weak appearing. HEENT:Oral mucosa moist, Ear/Nose WNL grossly,dentition normal. Respiratory system: bilaterally diminished, no wheezing or crackles no use  of accessory muscle, non tender. Cardiovascular system: S1 & S2 +, no murmur no JVD. Gastrointestinal system: Abdomen soft, NT,ND, BS+. Nervous System:Alert, awake, moving extremities Extremities: no edema, distal peripheral pulses palpable.  Skin: No rashes,no icterus. MSK: Normal muscle bulk,tone, power  Data Reviewed: I have personally reviewed following labs and imaging studies CBC: Recent Labs  Lab 08/29/20 0229 08/29/20 0305 08/29/20 0650 08/30/20 0554  WBC 6.8  --  6.3 6.2  NEUTROABS 4.1  --   --   --   HGB 12.9* 14.3 12.3* 12.5*  HCT 39.8 42.0 37.0* 37.8*  MCV 84.0  --  82.4 81.1  PLT 240  --  208 [id number]   Basic Metabolic Panel: Recent Labs  Lab 08/29/20 1132 08/29/20 1545 08/29/20 1836 08/29/20 2232 08/30/20 0554  NA 134* 131* 129* 131* 133*  K 4.3 4.8 4.6 4.1 4.2  CL 106 100 101 104 103  CO2 13* 20* 16* 20* 22  GLUCOSE 223* 380* 383* 298* 240*  BUN 56* 52* 50* 48* 47*  CREATININE 2.82* 2.61* 2.54* 2.48* 2.51*  CALCIUM 9.3 8.8* 8.8* 8.9 9.1   GFR: CrCl cannot be calculated (Unknown ideal weight.). Liver Function Tests: No results for input(s): AST, ALT, ALKPHOS, BILITOT, PROT, ALBUMIN in the last 168 hours. No results for input(s): LIPASE, AMYLASE in the last 168 hours. No results for input(s): AMMONIA in the last 168 hours. Coagulation Profile: No results for input(s): INR, PROTIME in the last 168 hours. Cardiac Enzymes: No results for input(s): CKTOTAL, CKMB, CKMBINDEX, TROPONINI in the  last 168 hours. BNP (last 3 results) No results for input(s): PROBNP in the last 8760 hours. HbA1C: Recent Labs    08/29/20 0650  HGBA1C 13.9*   CBG: Recent Labs  Lab 08/29/20 1807 08/29/20 2006 08/30/20 0802 08/30/20 0948 08/30/20 1159  GLUCAP 401* 397* 272* 309* 297*   Lipid Profile: No results for input(s): CHOL, HDL, LDLCALC, TRIG, CHOLHDL, LDLDIRECT in the last 72 hours. Thyroid Function Tests: No results for input(s): TSH, T4TOTAL, FREET4, T3FREE, THYROIDAB in the last 72 hours. Anemia Panel: No results for input(s): VITAMINB12, FOLATE, FERRITIN, TIBC, IRON, RETICCTPCT in the last 72 hours. Sepsis Labs: No results for input(s): PROCALCITON, LATICACIDVEN in the last 168 hours.  Recent Results (from the past 240 hour(s))  SARS CORONAVIRUS 2 (TAT 6-24 HRS) Nasopharyngeal Nasopharyngeal Swab     Status: None   Collection Time: 08/29/20  5:35 AM   Specimen: Nasopharyngeal Swab  Result Value Ref Range Status   SARS Coronavirus 2 NEGATIVE NEGATIVE Final    Comment: (NOTE) SARS-CoV-2 target nucleic acids are NOT DETECTED.  The SARS-CoV-2 RNA is generally detectable in upper and lower respiratory specimens during the acute phase of infection. Negative results do not preclude SARS-CoV-2 infection, do not rule out co-infections with other pathogens, and should not be used as the sole basis for treatment or other patient management decisions. Negative results must be combined with clinical observations, patient history, and epidemiological information. The expected result is Negative.  Fact Sheet for Patients: SugarRoll.be  Fact Sheet for Healthcare Providers: https://www.woods-mathews.com/  This test is not yet approved or cleared by the [place] FDA and  has been authorized for detection and/or diagnosis of SARS-CoV-2 by FDA under an Emergency Use Authorization (EUA). This EUA will remain  in effect (meaning this test can  be used) for the duration of the COVID-19 declaration under Se ction 564(b)(1) of the Act, 21 U.S.C. section 360bbb-3(b)(1), unless the authorization is terminated or revoked  sooner.  Performed at [facility], [place] [address], [place], [place] [address]      Radiology Studies: No results found.   LOS: 0 days   [name], MD [facility]  08/30/2020, 12:35 PM

## 2020-08-30 NOTE — Care Management Obs Status (Signed)
[employer] NOTIFICATION   Patient Details  Name: Keith [name] MRN: [name] Date of Birth: 1965/12/31   Medicare Observation Status Notification Given:  Yes    Angelita Ingles, RN 08/30/2020, 1:12 PM

## 2020-08-30 NOTE — Plan of Care (Signed)

## 2020-08-31 DIAGNOSIS — Z8261 Family history of arthritis: Secondary | ICD-10-CM | POA: Diagnosis not present

## 2020-08-31 DIAGNOSIS — Z882 Allergy status to sulfonamides status: Secondary | ICD-10-CM | POA: Diagnosis not present

## 2020-08-31 DIAGNOSIS — I129 Hypertensive chronic kidney disease with stage 1 through stage 4 chronic kidney disease, or unspecified chronic kidney disease: Secondary | ICD-10-CM | POA: Diagnosis present

## 2020-08-31 DIAGNOSIS — N179 Acute kidney failure, unspecified: Secondary | ICD-10-CM | POA: Diagnosis not present

## 2020-08-31 DIAGNOSIS — Z8673 Personal history of transient ischemic attack (TIA), and cerebral infarction without residual deficits: Secondary | ICD-10-CM | POA: Diagnosis not present

## 2020-08-31 DIAGNOSIS — L97929 Non-pressure chronic ulcer of unspecified part of left lower leg with unspecified severity: Secondary | ICD-10-CM | POA: Diagnosis not present

## 2020-08-31 DIAGNOSIS — Z9119 Patient's noncompliance with other medical treatment and regimen: Secondary | ICD-10-CM | POA: Diagnosis not present

## 2020-08-31 DIAGNOSIS — R739 Hyperglycemia, unspecified: Secondary | ICD-10-CM | POA: Diagnosis not present

## 2020-08-31 DIAGNOSIS — Z89511 Acquired absence of right leg below knee: Secondary | ICD-10-CM | POA: Diagnosis not present

## 2020-08-31 DIAGNOSIS — I2510 Atherosclerotic heart disease of native coronary artery without angina pectoris: Secondary | ICD-10-CM | POA: Diagnosis present

## 2020-08-31 DIAGNOSIS — E1165 Type 2 diabetes mellitus with hyperglycemia: Secondary | ICD-10-CM | POA: Diagnosis not present

## 2020-08-31 DIAGNOSIS — E1122 Type 2 diabetes mellitus with diabetic chronic kidney disease: Secondary | ICD-10-CM | POA: Diagnosis not present

## 2020-08-31 DIAGNOSIS — Z8249 Family history of ischemic heart disease and other diseases of the circulatory system: Secondary | ICD-10-CM | POA: Diagnosis not present

## 2020-08-31 DIAGNOSIS — Z7982 Long term (current) use of aspirin: Secondary | ICD-10-CM | POA: Diagnosis not present

## 2020-08-31 DIAGNOSIS — I252 Old myocardial infarction: Secondary | ICD-10-CM | POA: Diagnosis not present

## 2020-08-31 DIAGNOSIS — D631 Anemia in chronic kidney disease: Secondary | ICD-10-CM | POA: Diagnosis present

## 2020-08-31 DIAGNOSIS — Z955 Presence of coronary angioplasty implant and graft: Secondary | ICD-10-CM | POA: Diagnosis not present

## 2020-08-31 DIAGNOSIS — G8929 Other chronic pain: Secondary | ICD-10-CM | POA: Diagnosis present

## 2020-08-31 DIAGNOSIS — B20 Human immunodeficiency virus [HIV] disease: Secondary | ICD-10-CM | POA: Diagnosis present

## 2020-08-31 DIAGNOSIS — Z818 Family history of other mental and behavioral disorders: Secondary | ICD-10-CM | POA: Diagnosis not present

## 2020-08-31 DIAGNOSIS — Z9114 Patient's other noncompliance with medication regimen: Secondary | ICD-10-CM | POA: Diagnosis not present

## 2020-08-31 DIAGNOSIS — Z20822 Contact with and (suspected) exposure to covid-19: Secondary | ICD-10-CM | POA: Diagnosis not present

## 2020-08-31 DIAGNOSIS — Z809 Family history of malignant neoplasm, unspecified: Secondary | ICD-10-CM | POA: Diagnosis not present

## 2020-08-31 DIAGNOSIS — R69 Illness, unspecified: Secondary | ICD-10-CM | POA: Diagnosis not present

## 2020-08-31 DIAGNOSIS — Z79899 Other long term (current) drug therapy: Secondary | ICD-10-CM | POA: Diagnosis not present

## 2020-08-31 DIAGNOSIS — N184 Chronic kidney disease, stage 4 (severe): Secondary | ICD-10-CM | POA: Diagnosis not present

## 2020-08-31 LAB — GLUCOSE, CAPILLARY
Glucose-Capillary: 239 mg/dL — ABNORMAL HIGH (ref 70–99)
Glucose-Capillary: 239 mg/dL — ABNORMAL HIGH (ref 70–99)
Glucose-Capillary: 187 mg/dL — ABNORMAL HIGH (ref 70–99)
Glucose-Capillary: 202 mg/dL — ABNORMAL HIGH (ref 70–99)

## 2020-08-31 LAB — BASIC METABOLIC PANEL
Anion gap: 7 (ref 5–15)
BUN: 52 mg/dL — ABNORMAL HIGH (ref 6–20)
CO2: 21 mmol/L — ABNORMAL LOW (ref 22–32)
Calcium: 9.1 mg/dL (ref 8.9–10.3)
Chloride: 104 mmol/L (ref 98–111)
Creatinine, Ser: 3.03 mg/dL — ABNORMAL HIGH (ref 0.61–1.24)
GFR, Estimated: 24 mL/min — ABNORMAL LOW (ref >60)
Glucose, Bld: 211 mg/dL — ABNORMAL HIGH (ref 70–99)
Potassium: 4.4 mmol/L (ref 3.5–5.1)
Sodium: 132 mmol/L — ABNORMAL LOW (ref 135–145)

## 2020-08-31 MED ORDER — OXYCODONE HCL 5 MG PO TABS: 5.0000 mg | ORAL_TABLET | Freq: Three times a day (TID) | ORAL | Status: DC | PRN

## 2020-08-31 MED ORDER — SODIUM CHLORIDE 0.9 % IV SOLN: INTRAVENOUS | Status: AC

## 2020-08-31 MED ORDER — OXYCODONE HCL 5 MG PO TABS
10.0000 mg | ORAL_TABLET | Freq: Four times a day (QID) | ORAL | Status: DC | PRN
  Administered 2020-08-31 (×2): 10 mg via ORAL
  Filled 2020-08-31 (×2): qty 2

## 2020-08-31 NOTE — TOC Initial Note (Signed)
Transition of Care [facility]) - Initial/Assessment Note    Patient Details  Name: Keith [name] MRN: [name] Date of Birth: 01-13-1966  Transition of Care [facility]) CM/SW Contact:    Angelita Ingles, RN Phone Number: [phone]   08/31/2020, 2:35 PM  Clinical Narrative:                 [facility] consulted for patient with high risk for readmission. Patient is from home alone and states that he does have transportation but his doctor quit so he will need assistance with finding a PCP upon discharge. Patient states that he has access to meds and is compliant with medication regimen. TOC will continue to follow for disposition needs.    Expected Discharge Plan: Home/Self Care Barriers to Discharge: Continued Medical Work up   Patient Goals and CMS Choice Patient states their goals for this hospitalization and ongoing recovery are:: Patient states he is just ready to go home   Choice offered to / list presented to : NA  Expected Discharge Plan and Services Expected Discharge Plan: Home/Self Care In-house Referral: NA Discharge Planning Services: CM Consult Post Acute Care Choice: NA Living arrangements for the past 2 months: Apartment                 DME Arranged: N/A DME Agency: NA       HH Arranged: NA HH Agency: NA        Prior Living Arrangements/Services Living arrangements for the past 2 months: Apartment Lives with:: Self Patient language and need for interpreter reviewed:: Yes Do you feel safe going back to the place where you live?: Yes      Need for Family Participation in Patient Care: Yes (Comment) Care giver support system in place?: Yes (comment) Current home services:  (n/a) Criminal Activity/Legal Involvement Pertinent to Current Situation/Hospitalization: No - Comment as needed  Activities of Daily Living Home Assistive Devices/Equipment: Walker (specify type),CBG Meter ADL Screening (condition at time of admission) Patient's cognitive ability adequate to  safely complete daily activities?: Yes Is the patient deaf or have difficulty hearing?: No Does the patient have difficulty seeing, even when wearing glasses/contacts?: No Does the patient have difficulty concentrating, remembering, or making decisions?: No Patient able to express need for assistance with ADLs?: Yes Does the patient have difficulty dressing or bathing?: No Independently performs ADLs?: Yes (appropriate for developmental age) Does the patient have difficulty walking or climbing stairs?: No Weakness of Legs: None Weakness of Arms/Hands: None  Permission Sought/Granted   Permission granted to share information with : No              Emotional Assessment Appearance:: Appears stated age Attitude/Demeanor/Rapport: Gracious Affect (typically observed): Pleasant Orientation: : Oriented to Self,Oriented to Place,Oriented to  Time,Oriented to Situation Alcohol / Substance Use: Not Applicable Psych Involvement: No (comment)  Admission diagnosis:  Hyperglycemia [R73.9] AKI (acute kidney injury) ([place]) [N17.9] Hyperglycemia due to diabetes mellitus ([place]) [E11.65] Patient Active Problem List   Diagnosis Date Noted  . Hyperglycemia due to diabetes mellitus ([place]) 08/29/2020  . Hyperglycemia 08/29/2020  . Acute cerebrovascular accident (CVA) due to ischemia ([place]) 04/28/2020  . Type 2 diabetes mellitus with hyperlipidemia ([place])   . PAD (peripheral artery disease) ([place])   . Hypoglycemia 04/25/2020  . Acute ST elevation myocardial infarction (STEMI) due to occlusion of circumflex coronary artery ([place]) 03/22/2020  . Uncontrolled type 2 diabetes mellitus with hyperglycemia ([place]) 01/14/2019  . Elevated glucose 01/14/2019  . Hx of BKA,  right ([place]) 01/14/2019  . Pressure injury of skin 11/11/2018  . STEMI (ST elevation myocardial infarction) ([place]) 11/10/2018  . CKD (chronic kidney disease) stage 4, GFR 15-29 ml/min (HCC) 11/10/2018  . Amputation stump infection ([place]) 10/28/2018   . Type II diabetes mellitus with renal manifestations ([place]) 08/07/2018  . Uncontrolled type 2 diabetes mellitus with hyperosmolar nonketotic hyperglycemia ([place]) 08/07/2018  . Non-pressure chronic ulcer of right calf limited to breakdown of skin ([place]) 07/06/2018  . Type 2 diabetes mellitus without complication, without long-term current use of insulin ([place]) 06/15/2018  . Chronic low back pain without sciatica 06/15/2018  . Idiopathic chronic venous hypertension of left lower extremity with ulcer and inflammation ([place])   . Venous stasis ulcer of left calf limited to breakdown of skin without varicose veins ([place]) 04/23/2018  . Acquired absence of right leg below knee ([place]) 06/13/2017  . Acute renal failure superimposed on stage 3 chronic kidney disease ([place]) 03/15/2017  . Diabetic polyneuropathy associated with type 2 diabetes mellitus ([place]) 01/23/2017  . AKI (acute kidney injury) ([place]) 09/28/2016  . Nausea vomiting and diarrhea 09/28/2016  . Onychomycosis of multiple toenails with type 2 diabetes mellitus ([place]) 08/29/2015  . MRSA carrier 04/20/2014  . Penile wart 02/22/2014  . HIV disease ([place])   . Insulin-requiring or dependent type II diabetes mellitus ([place]) 02/04/2014  . Dental anomaly 11/20/2012  . Arthritis of right knee 02/23/2012  . Hyperlipidemia 11/10/2011  . Chronic pain 08/07/2011  . Meralgia paraesthetica 04/23/2011  . ERECTILE DYSFUNCTION 08/22/2008  . Essential hypertension 05/19/2008   PCP:  No primary care provider on file. Pharmacy:   [id number] [id number]: [phone]: [phone]    Social Determinants of Health (SDOH) Interventions    Readmission Risk Interventions Readmission Risk Prevention Plan 08/31/2020 11/13/2018 11/13/2018  Transportation Screening Complete - Complete  PCP or Specialist Appt within 3-5 Days Complete - -  HRI or  Home Care Consult Complete - -  Social Work Consult for [place]/Counseling Complete - -  Palliative Care Screening Not Applicable - -  Medication Review (Press photographer Referral to Pharmacy - Complete  PCP or Specialist appointment within 3-5 days of discharge - Complete -  [place]- Complete -  SW Recovery Care/Counseling Consult - Complete -  Palliative Care Screening - Not Applicable -  [place]- Not Applicable -  Some recent data might be hidden

## 2020-08-31 NOTE — Progress Notes (Signed)
PROGRESS NOTE    Keith [name]  [id number] DOB: 07-08-1965 DOA: 08/29/2020 PCP: No primary care provider on file.   Chief Complaint  Patient presents with  . Hyperglycemia  Brief Narrative: 55 year old male with history of HIV, recently stroke in January 2022, CAD status post stenting in December 21 T2DM on insulin,CKD stage IV who has ran out of insulin for past 2 weeks and has been running high blood sugar.He has a wound on left lower extremity and followed by [name]. He was seen in the ED found to have uncontrolled hyperglycemia not in  DKA and admitted on IV insulin drip Patient reports his primary doctor has left and he had no one to prescribe him insulin   Subjective:  Seen and examined this morning.  Patient feels well.  No new complaints.   Blood sugar fairly stable   Assessment & Plan:  Severe uncontrolled hyperglycemia due to noncompliance, not in DKA: Admitted on insulin drip, now transitioned to subcu insulin-increase Lantus increased to 40 units daily and 10 units Premeal NovoLog , on ssi, sugar 130-239 today.  Continue current dose and adjust insulin as needed. hba1c poorly controlled at 13.9. Recent Labs  Lab 08/30/20 0948 08/30/20 1159 08/30/20 1607 08/30/20 2019 08/31/20 0823  GLUCAP 309* 297* 189* 130* 239*   Hyponatremia: combination of volume depletion as well as pseudohyponatremia from hyperglycemia , improving.  Gently hydrated Recent Labs  Lab 08/29/20 1545 08/29/20 1836 08/29/20 2232 08/30/20 0554 08/31/20 0259  NA 131* 129* 131* 133* 132*   AKI on chronic CKD stage iv vs progression of [id number] creatinine 2.2-2.9, AKI likely in the setting of hyperglycemia-creatinine uptrending will gently hydrate overnight.  I discussed with nephrologist Dr. Hall Busing to hydrate gently and monitor BMP and if stable or better could discharge in the morning.   And have follow-up as outpatient soon as he has not had seen nephrology on outpatient , was  seen by nephrology for AKI on last admission in December.  He is not on diuretics or ACE or ARB's. Recent Labs  Lab 08/29/20 1545 08/29/20 1836 08/29/20 2232 08/30/20 0554 08/31/20 0259  BUN 52* 50* 48* 47* 52*  CREATININE 2.61* 2.54* 2.48* 2.51* 3.03*   CAD w/ history of stent patient without no chest pain, continue Brilinta aspirin statin and Coreg.  HIV on Biktarvy and dapsone  Stroke history-stable on his statin and brilinta  Hypertension BP well controlled on Coreg and amlodipine,held in  ED 2/2 soft BP.  Blood pressure stable here  Chronic anemia monitor closely. Recent Labs  Lab 08/29/20 0229 08/29/20 0305 08/29/20 0650 08/30/20 0554  HGB 12.9* 14.3 12.3* 12.5*  HCT 39.8 42.0 37.0* 37.8*   Persistent ulceration of the left leg with venous insufficiency followed by [name] on compression stocking, being changed weekly administered as outpatient.  Continue wound care.  He does not want it to be changed reports he is going to follow-up with [name] 1-2 days.  On chronic pain management with oxycodone reports he takes 15- saw 10 mg on home med list- resumed.  Right BKA status.  Diet Order            Diet Carb Modified Fluid consistency: Thin; Room service appropriate? Yes  Diet effective now                Patient's There is no height or weight on file to calculate BMI.  DVT prophylaxis: heparin injection 5,000 Units Start: 08/29/20 0630 Code Status:  Code Status: Full Code  Family Communication: plan of care discussed with patient at bedside.  Status is: admitted as observation Remains hospitalized for ongoing management of hyperglycemia and and now with AKI VS PROGRESSIVE  CKD needing IV fluid hydration further insulin adjustment  Dispo: The patient is from: Home              Anticipated d/c is to: Home likely tomorrow if renal function is stable              Patient currently is not medically stable to d/c.   Difficult to place patient No Unresulted  Labs (From admission, onward)          Start     Ordered   08/31/20 [id number]  Basic metabolic panel  Daily,   R     Question:  Specimen collection method  Answer:  Lab=Lab collect   08/30/20 0733        Medications reviewed:  Scheduled Meds: . amLODipine  10 mg Oral Daily  . aspirin EC  81 mg Oral Daily  . atorvastatin  80 mg Oral q1800  . bictegravir-emtricitabine-tenofovir AF  1 tablet Oral Daily  . carvedilol  25 mg Oral BID WC  . dapsone  100 mg Oral Daily  . heparin  5,000 Units Subcutaneous Q8H  . icosapent Ethyl  2 g Oral BID  . insulin aspart  0-15 Units Subcutaneous TID WC  . insulin aspart  0-5 Units Subcutaneous QHS  . insulin aspart  10 Units Subcutaneous TID WC  . insulin glargine  40 Units Subcutaneous Daily  . ticagrelor  90 mg Oral BID   Continuous Infusions: . sodium chloride 100 mL/hr at 08/31/20 0838    Consultants:see note  Procedures:see note  Antimicrobials: Anti-infectives (From admission, onward)   Start     Dose/Rate Route Frequency Ordered Stop   08/29/20 1000  bictegravir-emtricitabine-tenofovir AF (BIKTARVY) 50-200-25 MG per tablet 1 tablet        1 tablet Oral Daily 08/29/20 0624     08/29/20 1000  dapsone tablet 100 mg        100 mg Oral Daily 08/29/20 0624       Culture/Microbiology    Component Value Date/Time   SDES BLOOD LEFT ARM 11/03/2018 0615   SDES BLOOD LEFT ARM 11/03/2018 0615   SPECREQUEST  11/03/2018 0615    BOTTLES DRAWN AEROBIC ONLY Blood Culture adequate volume   SPECREQUEST  11/03/2018 0615    BOTTLES DRAWN AEROBIC ONLY Blood Culture results may not be optimal due to an inadequate volume of blood received in culture bottles   CULT  11/03/2018 0615    NO GROWTH 5 DAYS Performed at [facility], [place] [address], [place], [place] [address]    CULT  11/03/2018 0615    NO GROWTH 5 DAYS Performed at [facility] [address] [place], [place] [address]    REPTSTATUS 11/08/2018 FINAL 11/03/2018 0615    REPTSTATUS 11/08/2018 FINAL 11/03/2018 0615    Other culture-see note  Objective: Vitals: Today's Vitals   08/30/20 1100 08/30/20 1204 08/30/20 1600 08/31/20 0805  BP:  103/71 109/77 102/78  Pulse:  71 66 65  Resp:  16  17  Temp:  97.8 F (36.6 C) 98.1 F (36.7 C) 98.2 F (36.8 C)  TempSrc:  Oral Oral Oral  SpO2:  98% 100% 99%  PainSc: 0-No pain       Intake/Output Summary (Last 24 hours) at 08/31/2020 1000  Last data filed at 08/31/2020 [id number] Gross per 24 hour  Intake 197.92 ml  Output 400 ml  Net -202.08 ml   There were no vitals filed for this visit. Weight change:   Intake/Output from previous day: 05/25 0701 - 05/26 0700 In: 197.9 [I.V.:197.9] Out: 450 [Urine:450] Intake/Output this shift: Total I/O In: -  Out: 400 [Urine:400] There were no vitals filed for this visit.  Examination: General exam: AAOx 3, pleasant, not in distress. Older than stated age, weak appearing. HEENT:Oral mucosa moist, Ear/Nose WNL grossly, dentition normal. Respiratory system: bilaterally diminished, no crackles,no use of accessory muscle Cardiovascular system: S1 & S2 +, No JVD,. Gastrointestinal system: Abdomen soft,opbese,NT,ND, BS+ Nervous System:Alert, awake, moving extremities and grossly nonfocal Extremities: no edema, rt BKA, distal peripheral pulses palpable.  Skin: No rashes,no icterus. MSK: Normal muscle bulk,tone, power   Data Reviewed: I have personally reviewed following labs and imaging studies CBC: Recent Labs  Lab 08/29/20 0229 08/29/20 0305 08/29/20 0650 08/30/20 0554  WBC 6.8  --  6.3 6.2  NEUTROABS 4.1  --   --   --   HGB 12.9* 14.3 12.3* 12.5*  HCT 39.8 42.0 37.0* 37.8*  MCV 84.0  --  82.4 81.1  PLT 240  --  208 [id number]   Basic Metabolic Panel: Recent Labs  Lab 08/29/20 1545 08/29/20 1836 08/29/20 2232 08/30/20 0554 08/31/20 0259  NA 131* 129* 131* 133* 132*  K 4.8 4.6 4.1 4.2 4.4  CL 100 101 104 103 104  CO2 20* 16* 20* 22 21*  GLUCOSE 380* 383*  298* 240* 211*  BUN 52* 50* 48* 47* 52*  CREATININE 2.61* 2.54* 2.48* 2.51* 3.03*  CALCIUM 8.8* 8.8* 8.9 9.1 9.1   GFR: CrCl cannot be calculated (Unknown ideal weight.). Liver Function Tests: No results for input(s): AST, ALT, ALKPHOS, BILITOT, PROT, ALBUMIN in the last 168 hours. No results for input(s): LIPASE, AMYLASE in the last 168 hours. No results for input(s): AMMONIA in the last 168 hours. Coagulation Profile: No results for input(s): INR, PROTIME in the last 168 hours. Cardiac Enzymes: No results for input(s): CKTOTAL, CKMB, CKMBINDEX, TROPONINI in the last 168 hours. BNP (last 3 results) No results for input(s): PROBNP in the last 8760 hours. HbA1C: Recent Labs    08/29/20 0650  HGBA1C 13.9*   CBG: Recent Labs  Lab 08/30/20 0948 08/30/20 1159 08/30/20 1607 08/30/20 2019 08/31/20 0823  GLUCAP 309* 297* 189* 130* 239*   Lipid Profile: No results for input(s): CHOL, HDL, LDLCALC, TRIG, CHOLHDL, LDLDIRECT in the last 72 hours. Thyroid Function Tests: No results for input(s): TSH, T4TOTAL, FREET4, T3FREE, THYROIDAB in the last 72 hours. Anemia Panel: No results for input(s): VITAMINB12, FOLATE, FERRITIN, TIBC, IRON, RETICCTPCT in the last 72 hours. Sepsis Labs: No results for input(s): PROCALCITON, LATICACIDVEN in the last 168 hours.  Recent Results (from the past 240 hour(s))  SARS CORONAVIRUS 2 (TAT 6-24 HRS) Nasopharyngeal Nasopharyngeal Swab     Status: None   Collection Time: 08/29/20  5:35 AM   Specimen: Nasopharyngeal Swab  Result Value Ref Range Status   SARS Coronavirus 2 NEGATIVE NEGATIVE Final    Comment: (NOTE) SARS-CoV-2 target nucleic acids are NOT DETECTED.  The SARS-CoV-2 RNA is generally detectable in upper and lower respiratory specimens during the acute phase of infection. Negative results do not preclude SARS-CoV-2 infection, do not rule out co-infections with other pathogens, and should not be used as the sole basis for treatment or  other patient management decisions.  Negative results must be combined with clinical observations, patient history, and epidemiological information. The expected result is Negative.  Fact Sheet for Patients: SugarRoll.be  Fact Sheet for Healthcare Providers: https://www.woods-mathews.com/  This test is not yet approved or cleared by the [place] FDA and  has been authorized for detection and/or diagnosis of SARS-CoV-2 by FDA under an Emergency Use Authorization (EUA). This EUA will remain  in effect (meaning this test can be used) for the duration of the COVID-19 declaration under Se ction 564(b)(1) of the Act, 21 U.S.C. section 360bbb-3(b)(1), unless the authorization is terminated or revoked sooner.  Performed at [facility], [place] [address] [place], [place] [address]      Radiology Studies: No results found.   LOS: 0 days   [name], MD [facility]  08/31/2020, 10:00 AM

## 2020-09-01 DIAGNOSIS — E1165 Type 2 diabetes mellitus with hyperglycemia: Secondary | ICD-10-CM | POA: Diagnosis not present

## 2020-09-01 LAB — BASIC METABOLIC PANEL WITH GFR
Anion gap: 8 (ref 5–15)
BUN: 48 mg/dL — ABNORMAL HIGH (ref 6–20)
CO2: 19 mmol/L — ABNORMAL LOW (ref 22–32)
Calcium: 9 mg/dL (ref 8.9–10.3)
Chloride: 108 mmol/L (ref 98–111)
Creatinine, Ser: 2.78 mg/dL — ABNORMAL HIGH (ref 0.61–1.24)
GFR, Estimated: 26 mL/min — ABNORMAL LOW
Glucose, Bld: 163 mg/dL — ABNORMAL HIGH (ref 70–99)
Potassium: 4.6 mmol/L (ref 3.5–5.1)
Sodium: 135 mmol/L (ref 135–145)

## 2020-09-01 LAB — GLUCOSE, CAPILLARY
Glucose-Capillary: 164 mg/dL — ABNORMAL HIGH (ref 70–99)
Glucose-Capillary: 149 mg/dL — ABNORMAL HIGH (ref 70–99)

## 2020-09-01 MED ORDER — INSULIN PEN NEEDLE 32G X 4 MM MISC: 1.0000 | Freq: Four times a day (QID) | 1 refills | Status: DC

## 2020-09-01 MED ORDER — INSULIN GLARGINE 100 UNIT/ML ~~LOC~~ SOLN: 40.0000 [IU] | Freq: Every day | SUBCUTANEOUS | 1 refills | Status: DC

## 2020-09-01 MED ORDER — CARVEDILOL 25 MG PO TABS: 25.0000 mg | ORAL_TABLET | Freq: Two times a day (BID) | ORAL | 1 refills | Status: DC

## 2020-09-01 MED ORDER — INSULIN SYRINGES (DISPOSABLE) U-100 0.3 ML MISC: 1.0000 | Freq: Three times a day (TID) | 11 refills | Status: DC

## 2020-09-01 MED ORDER — TICAGRELOR 90 MG PO TABS: 90.0000 mg | ORAL_TABLET | Freq: Two times a day (BID) | ORAL | 0 refills | Status: AC

## 2020-09-01 MED ORDER — INSULIN ASPART 100 UNIT/ML FLEXPEN: 10.0000 [IU] | PEN_INJECTOR | Freq: Three times a day (TID) | SUBCUTANEOUS | 1 refills | Status: DC

## 2020-09-01 MED ORDER — AMLODIPINE BESYLATE 10 MG PO TABS: 10.0000 mg | ORAL_TABLET | Freq: Every day | ORAL | 1 refills | Status: DC

## 2020-09-01 MED ORDER — ATORVASTATIN CALCIUM 80 MG PO TABS: 80.0000 mg | ORAL_TABLET | Freq: Every day | ORAL | 1 refills | Status: DC

## 2020-09-01 MED ORDER — GABAPENTIN 100 MG PO CAPS: 100.0000 mg | ORAL_CAPSULE | Freq: Three times a day (TID) | ORAL | 1 refills | Status: DC

## 2020-09-01 NOTE — Discharge Summary (Signed)
Physician Discharge Summary  Keith [name] VCB:[id number] DOB: April 10, 1965 DOA: 08/29/2020  PCP: No primary care provider on file.  Admit date: 08/29/2020 Discharge date: 09/01/2020  Admitted From: home Disposition:  home  Recommendations for Outpatient Follow-up:  1. Follow up with PCP in 1-2 weeks, with [name] for dressing change 2. Please obtain BMP/CBC in one week 3. Monitor blood sugar 4 times a day at home and follow-up with PCP to adjust insulin 4. Please follow up on the following pending results:  Home Health:no  Equipment/Devices: none  Discharge Condition: Stable Code Status:   Code Status: Full Code Diet recommendation:  Diet Order            Diet - low sodium heart healthy           Diet Carb Modified Fluid consistency: Thin; Room service appropriate? Yes  Diet effective now                  Brief/Interim Summary: 55 year old male with history of HIV, recently stroke in January 2022, CAD status post stenting in December 21 T2DM on insulin,CKD stage IV who has ran out of insulin for past 2 weeks and has been running high blood sugar.He has a wound on left lower extremity and followed by [name]. He was seen in the ED found tohave uncontrolled hyperglycemia not inDKA and admitted on IV insulin drip Patient reports his primary doctor has left and he had no one to prescribe him insulin  Patient has managed with insulin drip subsequently transitioned to subcu insulin hypoglycemia stabilized.  He also had AKI on CKD that improved with IV fluid hydration discussed with nephrology, Dr. [name] will plan to follow-up as outpatient. At this time patient medically stable has been discharged home with prescription on his previous medication as well as insulin.  Discharge Diagnoses:  Severe uncontrolled hyperglycemia due to noncompliance, not in DKA: Admitted on insulin drip, now transitioned to subcu insulin continue Lantus at 40 units daily and 10 units Premeal NovoLog .  hba1c poorly controlled at 13.9.  Continue to follow-up with PCP for further diabetes management. Recent Labs  Lab 08/31/20 0823 08/31/20 1142 08/31/20 1538 08/31/20 2133 09/01/20 0815  GLUCAP 239* 239* 187* 202* 164*   Hyponatremia: combination of volume depletion as well as pseudohyponatremia from hyperglycemia , improving.  Gently hydrated Recent Labs  Lab 08/29/20 1836 08/29/20 2232 08/30/20 0554 08/31/20 0259 09/01/20 0205  NA 129* 131* 133* 132* 135   AKI on chronic CKD stage iv vs progression of BWG:YKZLDJTT creatinine 2.2-2.9, AKI likely in the setting of hyperglycemia-creatinine uptrending will gently hydrate overnight.  I discussed with nephrologist Dr. Hall Busing to hydrate gently and monitor BMP and creatinine improved next morning and he is being discharged home with nephrology outpatient follow-up .He is not on diuretics or ACE or ARB's. Recent Labs  Lab 08/29/20 1836 08/29/20 2232 08/30/20 0554 08/31/20 0259 09/01/20 0205  BUN 50* 48* 47* 52* 48*  CREATININE 2.54* 2.48* 2.51* 3.03* 2.78*   CAD w/ history of stent inb 03/2020- patient without no chest pain, continue Brilinta aspirin-as you are supposed to be on that for at least 12 months, continue his Coreg, statin and follow-up with his cardiologist.    HIV on Biktarvy and dapsone  Stroke history-stable on his statin and brilinta  Hypertension BP well controlled on Coreg and amlodipine,held in  ED 2/2 soft BP.  Blood pressure stable here  Chronic anemia monitor closely. Recent Labs  Lab 08/29/20 [id number]  08/29/20 0305 08/29/20 0650 08/30/20 0554  HGB 12.9* 14.3 12.3* 12.5*  HCT 39.8 42.0 37.0* 37.8*   Persistent ulceration of the left leg with venous insufficiency followed by [name] on compression stocking, being changed weekly administered as outpatient.  Continue wound care.  He does not want it to be changed reports he is going to follow-up with [name] 1-2 days.  On chronic pain management  with oxycodone reports he takes 15- saw 10 mg on home med list- resumed.  Right BKA status.  Consults:  nephro over phone.  Subjective: Alert awake oriented resting comfortably.  He would like to go home today. Discharge Exam: Vitals:   09/01/20 0526 09/01/20 0817  BP: 130/84 118/86  Pulse: 60   Resp: 11   Temp: 97.6 F (36.4 C)   SpO2: 99%    General: Pt is alert, awake, not in acute distress Cardiovascular: RRR, S1/S2 +, no rubs, no gallops Respiratory: CTA bilaterally, no wheezing, no rhonchi Abdominal: Soft, NT, ND, bowel sounds + Extremities: no edema, no cyanosis. Rt bka. Left leg in bandage.  Discharge Instructions  Discharge Instructions    Diet - low sodium heart healthy   Complete by: As directed    Discharge instructions   Complete by: As directed    Please call call MD or return to ER for similar or worsening recurring problem that brought you to hospital or if any fever,nausea/vomiting,abdominal pain, uncontrolled pain, chest pain,  shortness of breath or any other alarming symptoms.  Please follow-up your doctor as instructed in a week time and call the office for appointment.  Please avoid alcohol, smoking, or any other illicit substance and maintain healthy habits including taking your regular medications as prescribed.  You were cared for by a hospitalist during your hospital stay. If you have any questions about your discharge medications or the care you received while you were in the hospital after you are discharged, you can call the unit and ask to speak with the hospitalist on call if the hospitalist that took care of you is not available.  Once you are discharged, your primary care physician will handle any further medical issues. Please note that NO REFILLS for any discharge medications will be authorized once you are discharged, as it is imperative that you return to your primary care physician (or establish a relationship with a primary care  physician if you do not have one) for your aftercare needs so that they can reassess your need for medications and monitor your lab values   Check blood sugar 3 times a day and bedtime at home. If blood sugar running above 200 less than 70 please call your MD to adjust insulin. If blood sugars running less 100 do not use insulin and call MD. If you noticed signs and symptoms of hypoglycemia or low blood sugar like jitteriness, confusion, thirst, tremor, sweating- Check blood sugar, drink sugary drink/biscuits/sweets to increase sugar level and call MD or return to ER.   Discharge wound care:   Complete by: As directed    Continue weekly dressing change of the lower extremities follow-up with [name]   Increase activity slowly   Complete by: As directed      Allergies as of 09/01/2020      Reactions   Elemental Sulfur Itching   Patient stated he's allergic to "sulfur" AND "sulfa"   Sulfa Antibiotics Itching      Medication List    STOP taking these medications  clopidogrel 75 MG tablet Commonly known as: PLAVIX   Levemir FlexTouch 100 UNIT/ML FlexPen Generic drug: insulin detemir     TAKE these medications   acetaminophen 325 MG tablet Commonly known as: TYLENOL Take 2 tablets (650 mg total) by mouth every 4 (four) hours as needed for mild pain, fever or headache.   allopurinol 300 MG tablet Commonly known as: ZYLOPRIM Take 300 mg by mouth daily.   ALPRAZolam 1 MG tablet Commonly known as: XANAX Take 1 mg by mouth daily as needed for anxiety.   amLODipine 10 MG tablet Commonly known as: NORVASC Take 1 tablet (10 mg total) by mouth daily.   aspirin 81 MG EC tablet Take 1 tablet (81 mg total) by mouth daily.   atorvastatin 80 MG tablet Commonly known as: LIPITOR Take 1 tablet (80 mg total) by mouth daily.   Biktarvy 50-200-25 MG Tabs tablet Generic drug: bictegravir-emtricitabine-tenofovir AF Take 1 tablet by mouth daily.   blood glucose meter kit and  supplies Dispense based on patient and insurance preference. Use up to four times daily as directed. (FOR ICD-10 E10.9, E11.9). What changed:   how much to take  how to take this  when to take this   carvedilol 25 MG tablet Commonly known as: COREG Take 1 tablet (25 mg total) by mouth 2 (two) times daily with a meal.   Cool Blood Glucose Test Strips test strip Generic drug: glucose blood Use as instructed What changed:   how much to take  how to take this  when to take this  additional instructions   dapsone 100 MG tablet TAKE 1 TABLET (100 MG TOTAL) BY MOUTH DAILY. What changed: how much to take   Easy Comfort Pen Needles 31G X 5 MM Misc Generic drug: Insulin Pen Needle INJECT 20 UNITS DAILY AS DIRECTED. What changed: See the new instructions.   Insulin Pen Needle 32G X 4 MM Misc Inject 1 each into the skin 4 (four) times daily. as directed What changed:   how much to take  how to take this   escitalopram 20 MG tablet Commonly known as: LEXAPRO Take 20 mg by mouth daily.   gabapentin 100 MG capsule Commonly known as: NEURONTIN Take 1 capsule (100 mg total) by mouth 3 (three) times daily.   insulin aspart 100 UNIT/ML FlexPen Commonly known as: NOVOLOG Inject 10 Units into the skin 3 (three) times daily with meals.   insulin glargine 100 UNIT/ML injection Commonly known as: LANTUS Inject 0.4 mLs (40 Units total) into the skin at bedtime. What changed: how much to take   Insulin Syringes (Disposable) U-100 0.3 ML Misc 1 Syringe by Does not apply route 3 (three) times daily.   nitroGLYCERIN 0.4 MG SL tablet Commonly known as: NITROSTAT PLACE 1 TABLET (0.4 MG TOTAL) UNDER THE TONGUE EVERY FIVE MINUTES X 3 DOSES AS NEEDED FOR CHEST PAIN. What changed: how much to take   omega-3 acid ethyl esters 1 g capsule Commonly known as: LOVAZA Take 1 g by mouth 2 (two) times daily.   onetouch ultrasoft lancets Use as instructed What changed:   how much to  take  how to take this  when to take this  additional instructions   oxybutynin 5 MG tablet Commonly known as: DITROPAN TAKE 1 TABLET (5 MG TOTAL) BY MOUTH TWO TIMES DAILY. What changed: how much to take   Oxycodone HCl 10 MG Tabs Take 1 tablet (10 mg total) by mouth every 6 (six) hours as  needed for severe pain (Severe pain.). What changed: when to take this   ticagrelor 90 MG Tabs tablet Commonly known as: BRILINTA Take 1 tablet (90 mg total) by mouth 2 (two) times daily. What changed:   how much to take  how to take this  when to take this   Vascepa 1 g capsule Generic drug: icosapent Ethyl TAKE 2 CAPSULES (2 G TOTAL) BY MOUTH TWO TIMES DAILY. What changed:   how much to take  how to take this  when to take this            Discharge Care Instructions  (From admission, onward)         Start     Ordered   09/01/20 0000  Discharge wound care:       Comments: Continue weekly dressing change of the lower extremities follow-up with [name]   09/01/20 [place], Jayadeep S, MD Follow up in 1 week(s).   Specialties: Cardiology, Radiology, Interventional Cardiology Contact information: [address] [address] [phone]        [place]. Call in 1 week(s).   Contact information: [place] [phone]       [name], MD. Call in 1 day(s).   Specialty: Orthopedic Surgery Contact information: [address] [place] [address] [phone]              Allergies  Allergen Reactions  . Elemental Sulfur Itching    Patient stated he's allergic to "sulfur" AND "sulfa"  . Sulfa Antibiotics Itching    The results of significant diagnostics from this hospitalization (including imaging, microbiology, ancillary and laboratory) are listed below for reference.    Microbiology: Recent  Results (from the past 240 hour(s))  SARS CORONAVIRUS 2 (TAT 6-24 HRS) Nasopharyngeal Nasopharyngeal Swab     Status: None   Collection Time: 08/29/20  5:35 AM   Specimen: Nasopharyngeal Swab  Result Value Ref Range Status   SARS Coronavirus 2 NEGATIVE NEGATIVE Final    Comment: (NOTE) SARS-CoV-2 target nucleic acids are NOT DETECTED.  The SARS-CoV-2 RNA is generally detectable in upper and lower respiratory specimens during the acute phase of infection. Negative results do not preclude SARS-CoV-2 infection, do not rule out co-infections with other pathogens, and should not be used as the sole basis for treatment or other patient management decisions. Negative results must be combined with clinical observations, patient history, and epidemiological information. The expected result is Negative.  Fact Sheet for Patients: SugarRoll.be  Fact Sheet for Healthcare Providers: https://www.woods-mathews.com/  This test is not yet approved or cleared by the [place] FDA and  has been authorized for detection and/or diagnosis of SARS-CoV-2 by FDA under an Emergency Use Authorization (EUA). This EUA will remain  in effect (meaning this test can be used) for the duration of the COVID-19 declaration under Se ction 564(b)(1) of the Act, 21 U.S.C. section 360bbb-3(b)(1), unless the authorization is terminated or revoked sooner.  Performed at [facility], [place] [address] [place], [place] [address]     Procedures/Studies: No results found.  Labs: BNP (last 3 results) No results for input(s): BNP in the last 8760 hours. Basic Metabolic Panel: Recent Labs  Lab 08/29/20 1836 08/29/20 2232 08/30/20 0554 08/31/20 0259 09/01/20 0205  NA 129* 131* 133* 132* 135  K 4.6  4.1 4.2 4.4 4.6  CL 101 104 103 104 108  CO2 16* 20* 22 21* 19*  GLUCOSE 383* 298* 240* 211* 163*  BUN 50* 48* 47* 52* 48*  CREATININE 2.54* 2.48* 2.51* 3.03* 2.78*   CALCIUM 8.8* 8.9 9.1 9.1 9.0   Liver Function Tests: No results for input(s): AST, ALT, ALKPHOS, BILITOT, PROT, ALBUMIN in the last 168 hours. No results for input(s): LIPASE, AMYLASE in the last 168 hours. No results for input(s): AMMONIA in the last 168 hours. CBC: Recent Labs  Lab 08/29/20 0229 08/29/20 0305 08/29/20 0650 08/30/20 0554  WBC 6.8  --  6.3 6.2  NEUTROABS 4.1  --   --   --   HGB 12.9* 14.3 12.3* 12.5*  HCT 39.8 42.0 37.0* 37.8*  MCV 84.0  --  82.4 81.1  PLT 240  --  208 217   Cardiac Enzymes: No results for input(s): CKTOTAL, CKMB, CKMBINDEX, TROPONINI in the last 168 hours. BNP: Invalid input(s): POCBNP CBG: Recent Labs  Lab 08/31/20 0823 08/31/20 1142 08/31/20 1538 08/31/20 2133 09/01/20 0815  GLUCAP 239* 239* 187* 202* 164*   D-Dimer No results for input(s): DDIMER in the last 72 hours. Hgb A1c No results for input(s): HGBA1C in the last 72 hours. Lipid Profile No results for input(s): CHOL, HDL, LDLCALC, TRIG, CHOLHDL, LDLDIRECT in the last 72 hours. Thyroid function studies No results for input(s): TSH, T4TOTAL, T3FREE, THYROIDAB in the last 72 hours.  Invalid input(s): FREET3 Anemia work up No results for input(s): VITAMINB12, FOLATE, FERRITIN, TIBC, IRON, RETICCTPCT in the last 72 hours. Urinalysis    Component Value Date/Time   COLORURINE STRAW (A) 08/29/2020 0234   APPEARANCEUR CLEAR 08/29/2020 0234   LABSPEC 1.018 08/29/2020 0234   PHURINE 6.0 08/29/2020 0234   GLUCOSEU >=500 (A) 08/29/2020 0234   HGBUR NEGATIVE 08/29/2020 0234   BILIRUBINUR NEGATIVE 08/29/2020 0234   BILIRUBINUR negative 09/07/2018 1034   KETONESUR NEGATIVE 08/29/2020 0234   PROTEINUR 100 (A) 08/29/2020 0234   UROBILINOGEN 1.0 09/07/2018 1034   UROBILINOGEN 1.0 01/01/2017 1032   NITRITE NEGATIVE 08/29/2020 0234   LEUKOCYTESUR NEGATIVE 08/29/2020 0234   Sepsis Labs Invalid input(s): PROCALCITONIN,  WBC,  LACTICIDVEN Microbiology Recent Results (from the  past 240 hour(s))  SARS CORONAVIRUS 2 (TAT 6-24 HRS) Nasopharyngeal Nasopharyngeal Swab     Status: None   Collection Time: 08/29/20  5:35 AM   Specimen: Nasopharyngeal Swab  Result Value Ref Range Status   SARS Coronavirus 2 NEGATIVE NEGATIVE Final    Comment: (NOTE) SARS-CoV-2 target nucleic acids are NOT DETECTED.  The SARS-CoV-2 RNA is generally detectable in upper and lower respiratory specimens during the acute phase of infection. Negative results do not preclude SARS-CoV-2 infection, do not rule out co-infections with other pathogens, and should not be used as the sole basis for treatment or other patient management decisions. Negative results must be combined with clinical observations, patient history, and epidemiological information. The expected result is Negative.  Fact Sheet for Patients: SugarRoll.be  Fact Sheet for Healthcare Providers: https://www.woods-mathews.com/  This test is not yet approved or cleared by the [place] FDA and  has been authorized for detection and/or diagnosis of SARS-CoV-2 by FDA under an Emergency Use Authorization (EUA). This EUA will remain  in effect (meaning this test can be used) for the duration of the COVID-19 declaration under Se ction 564(b)(1) of the Act, 21 U.S.C. section 360bbb-3(b)(1), unless the authorization is terminated or revoked sooner.  Performed at [facility], [address], [place], [place] [address]      Time coordinating discharge: 35 minutes  SIGNED: [name], MD  [facility] 09/01/2020, 8:34 AM  If 7PM-7AM, please contact night-coverage [email or web address]

## 2020-09-01 NOTE — Plan of Care (Signed)
  Problem: Education: Goal: Knowledge of General Education information will improve Description: Including pain rating scale, medication(s)/side effects and non-pharmacologic comfort measures Outcome: Not Progressing   Problem: Health Behavior/Discharge Planning: Goal: Ability to manage health-related needs will improve Outcome: Not Progressing   Problem: Clinical Measurements: Goal: Ability to maintain clinical measurements within normal limits will improve Outcome: Not Progressing Goal: Will remain free from infection Outcome: Not Progressing Goal: Diagnostic test results will improve Outcome: Not Progressing Goal: Respiratory complications will improve Outcome: Not Progressing Goal: Cardiovascular complication will be avoided Outcome: Not Progressing   Problem: Activity: Goal: Risk for activity intolerance will decrease Outcome: Not Progressing   Problem: Nutrition: Goal: Adequate nutrition will be maintained Outcome: Not Progressing   Problem: Coping: Goal: Level of anxiety will decrease Outcome: Not Progressing   

## 2020-09-05 ENCOUNTER — Telehealth: Payer: Self-pay | Admitting: *Deleted

## 2020-09-05 ENCOUNTER — Ambulatory Visit: Payer: Medicaid Other | Admitting: Infectious Diseases

## 2020-09-05 ENCOUNTER — Ambulatory Visit: Payer: Medicare HMO | Admitting: Family

## 2020-09-05 DIAGNOSIS — I10 Essential (primary) hypertension: Secondary | ICD-10-CM | POA: Diagnosis not present

## 2020-09-05 DIAGNOSIS — E559 Vitamin D deficiency, unspecified: Secondary | ICD-10-CM | POA: Diagnosis not present

## 2020-09-05 DIAGNOSIS — G546 Phantom limb syndrome with pain: Secondary | ICD-10-CM | POA: Diagnosis not present

## 2020-09-05 DIAGNOSIS — E119 Type 2 diabetes mellitus without complications: Secondary | ICD-10-CM | POA: Diagnosis not present

## 2020-09-05 DIAGNOSIS — E7800 Pure hypercholesterolemia, unspecified: Secondary | ICD-10-CM | POA: Diagnosis not present

## 2020-09-05 DIAGNOSIS — Z79899 Other long term (current) drug therapy: Secondary | ICD-10-CM | POA: Diagnosis not present

## 2020-09-05 DIAGNOSIS — R69 Illness, unspecified: Secondary | ICD-10-CM | POA: Diagnosis not present

## 2020-09-05 NOTE — Telephone Encounter (Signed)
Notified Brianna at [facility] that patient no-showed today's visit. She will relay to [facility], his case manager at [facility] Landis Gandy, RN

## 2020-09-07 ENCOUNTER — Telehealth: Payer: Self-pay

## 2020-09-07 NOTE — Telephone Encounter (Signed)
Pt needs a referral home health

## 2020-09-11 NOTE — Telephone Encounter (Signed)
Yes. Thanks 

## 2020-09-12 ENCOUNTER — Encounter: Payer: Self-pay | Admitting: Orthopedic Surgery

## 2020-09-12 ENCOUNTER — Ambulatory Visit (INDEPENDENT_AMBULATORY_CARE_PROVIDER_SITE_OTHER): Payer: Medicare HMO | Admitting: Physician Assistant

## 2020-09-12 DIAGNOSIS — L97929 Non-pressure chronic ulcer of unspecified part of left lower leg with unspecified severity: Secondary | ICD-10-CM | POA: Diagnosis not present

## 2020-09-12 DIAGNOSIS — I87332 Chronic venous hypertension (idiopathic) with ulcer and inflammation of left lower extremity: Secondary | ICD-10-CM | POA: Diagnosis not present

## 2020-09-12 NOTE — Progress Notes (Signed)
Office Visit Note   Patient: Keith [name]           Date of Birth: 30-Oct-1965           MRN: [name] Visit Date: 09/12/2020              Requested by: No referring provider defined for this encounter. PCP: Pcp, No  Chief Complaint  Patient presents with  . Left Leg - Follow-up      HPI: Patient presents in follow-up today for his left lower extremity venous stasis ulcer.  He was placed in a Profore dressing last week which did get pushed down the leg and up the foot the right dressing was in a different place than where it was applied.  I did emphasize the importance of elevating his leg with his heel hanging free  Assessment & Plan: Visit Diagnoses: No diagnosis found.  Plan: Patient was placed in a double extra-large compression sock today.  Gave him instructions that he is to wear this for 24 hours take it off wash shower and dry in place and move sock.  He understands the importance of elevating his leg.  Would like to recheck him in a week and then if he is doing better we could increase the intervals for follow-up  Follow-Up Instructions: No follow-ups on file.   Ortho Exam  Patient is alert, oriented, no adenopathy, well-dressed, normal affect, normal respiratory effort.  Examination of his left lower extremity swelling has decreased he has wrinkling of the skin a bit more swelling distally and proximally where he had been pushing down the dressing.  No ascending cellulitis or findings of infection.  Ulcer is at skin surface measuring about 3 x 6 cm no ascending cellulitis of note he does have a thickened callus over his heel.  But no fluctuance or erythema   Imaging: No results found. No images are attached to the encounter.  Labs: Lab Results  Component Value Date   HGBA1C 13.9 (H) 08/29/2020   HGBA1C 10.6 (H) 05/19/2020   HGBA1C >15.5 (H) 03/24/2020   ESRSEDRATE 130 (H) 11/02/2018   ESRSEDRATE 140 (H) 10/30/2018   ESRSEDRATE 74 (H) 06/06/2018   CRP <0.8  11/02/2018   CRP 2.0 (H) 10/30/2018   CRP <0.8 06/06/2018   LABURIC 6.3 08/07/2009   REPTSTATUS 11/08/2018 FINAL 11/03/2018   REPTSTATUS 11/08/2018 FINAL 11/03/2018   GRAMSTAIN  04/29/2017    NO WBC SEEN RARE GRAM POSITIVE COCCI IN PAIRS IN SINGLES    CULT  11/03/2018    NO GROWTH 5 DAYS Performed at [facility], [place] [address] [place], [place] [address]    CULT  11/03/2018    NO GROWTH 5 DAYS Performed at [place] [address], [place], [place] [address]    LABORGA METHICILLIN RESISTANT STAPHYLOCOCCUS AUREUS 04/29/2017     Lab Results  Component Value Date   ALBUMIN 3.3 (L) 04/25/2020   ALBUMIN 2.5 (L) 03/26/2020   ALBUMIN 2.3 (L) 03/25/2020   PREALBUMIN 25.5 06/06/2018   PREALBUMIN 7.4 (L) 03/15/2017    Lab Results  Component Value Date   MG 2.1 04/28/2020   MG 2.0 04/27/2020   MG 2.0 04/26/2020   Lab Results  Component Value Date   VD25OH 8.2 (L) 09/07/2018    Lab Results  Component Value Date   PREALBUMIN 25.5 06/06/2018   PREALBUMIN 7.4 (L) 03/15/2017   CBC EXTENDED Latest Ref Rng & Units 08/30/2020 08/29/2020 08/29/2020  WBC  4.0 - 10.5 K/uL 6.2 6.3 -  RBC 4.22 - 5.81 MIL/uL 4.66 4.49 -  HGB 13.0 - 17.0 g/dL 12.5(L) 12.3(L) 14.3  HCT 39.0 - 52.0 % 37.8(L) 37.0(L) 42.0  PLT 150 - 400 K/uL 217 208 -  NEUTROABS 1.7 - 7.7 K/uL - - -  LYMPHSABS 0.7 - 4.0 K/uL - - -     There is no height or weight on file to calculate BMI.  Orders:  No orders of the defined types were placed in this encounter.  No orders of the defined types were placed in this encounter.    Procedures: No procedures performed  Clinical Data: No additional findings.  ROS:  All other systems negative, except as noted in the HPI. Review of Systems  Objective: Vital Signs: There were no vitals taken for this visit.  Specialty Comments:  No specialty comments available.  PMFS History: Patient Active Problem List   Diagnosis Date Noted  . Hyperglycemia  due to diabetes mellitus ([place]) 08/29/2020  . Hyperglycemia 08/29/2020  . Acute cerebrovascular accident (CVA) due to ischemia ([place]) 04/28/2020  . Type 2 diabetes mellitus with hyperlipidemia ([place])   . PAD (peripheral artery disease) ([place])   . Hypoglycemia 04/25/2020  . Acute ST elevation myocardial infarction (STEMI) due to occlusion of circumflex coronary artery ([place]) 03/22/2020  . Uncontrolled type 2 diabetes mellitus with hyperglycemia ([place]) 01/14/2019  . Elevated glucose 01/14/2019  . Hx of BKA, right ([place]) 01/14/2019  . Pressure injury of skin 11/11/2018  . STEMI (ST elevation myocardial infarction) ([place]) 11/10/2018  . CKD (chronic kidney disease) stage 4, GFR 15-29 ml/min (HCC) 11/10/2018  . Amputation stump infection ([place]) 10/28/2018  . Type II diabetes mellitus with renal manifestations ([place]) 08/07/2018  . Uncontrolled type 2 diabetes mellitus with hyperosmolar nonketotic hyperglycemia ([place]) 08/07/2018  . Non-pressure chronic ulcer of right calf limited to breakdown of skin ([place]) 07/06/2018  . Type 2 diabetes mellitus without complication, without long-term current use of insulin ([place]) 06/15/2018  . Chronic low back pain without sciatica 06/15/2018  . Idiopathic chronic venous hypertension of left lower extremity with ulcer and inflammation ([place])   . Venous stasis ulcer of left calf limited to breakdown of skin without varicose veins ([place]) 04/23/2018  . Acquired absence of right leg below knee ([place]) 06/13/2017  . Acute renal failure superimposed on stage 3 chronic kidney disease ([place]) 03/15/2017  . Diabetic polyneuropathy associated with type 2 diabetes mellitus ([place]) 01/23/2017  . AKI (acute kidney injury) ([place]) 09/28/2016  . Nausea vomiting and diarrhea 09/28/2016  . Onychomycosis of multiple toenails with type 2 diabetes mellitus ([place]) 08/29/2015  . MRSA carrier 04/20/2014  . Penile wart 02/22/2014  . HIV disease ([place])   . Insulin-requiring or dependent type II diabetes  mellitus ([place]) 02/04/2014  . Dental anomaly 11/20/2012  . Arthritis of right knee 02/23/2012  . Hyperlipidemia 11/10/2011  . Chronic pain 08/07/2011  . Meralgia paraesthetica 04/23/2011  . ERECTILE DYSFUNCTION 08/22/2008  . Essential hypertension 05/19/2008   Past Medical History:  Diagnosis Date  . Abscess of right foot    abscess/ulcer of R transtibial amputation requiring IV abx  . AIDS ([place])   . Anemia   . CAD (coronary artery disease)    a. MI with stenting of OM1 in 11/2018 with residual disease. b. acute STEMI 03/2020 s/p DES to OM1  . Chronic anemia   . Chronic knee pain    right  . Chronic pain   . CKD (chronic kidney  disease), stage IV ([place])   . CVA (cerebral vascular accident) ([place]) 04/2020  . Diabetes type 2, uncontrolled ([place])    HgA1c 17.6 (04/27/2010)  . Diabetic foot ulcer ([place]) 01/2017   right foot  . Dilatation of aorta (HCC)   . Erectile dysfunction   . Genital warts   . HIV (human immunodeficiency virus infection) ([place]) 2009   CD4 count 100, VL 13800 (05/01/2010)  . Hyperlipidemia   . Hypertension   . Noncompliance with medication regimen   . Osteomyelitis ([place])    h/o hand  . Osteomyelitis of metatarsal ([place]) 04/28/2017    Family History  Problem Relation Age of Onset  . Hypertension Mother   . Arthritis Father   . Hypertension Father   . Hypertension Brother   . Cancer Maternal Grandmother 48       unknown type of cancer  . Depression Paternal Grandmother     Past Surgical History:  Procedure Laterality Date  . AMPUTATION Right 05/02/2017   Procedure: AMPUTATION TRANSMETARSAL;  Surgeon: [name], MD;  Location: [place];  Service: Orthopedics;  Laterality: Right;  . AMPUTATION Right 06/13/2017   Procedure: RIGHT BELOW KNEE AMPUTATION;  Surgeon: [name], MD;  Location: [place];  Service: Orthopedics;  Laterality: Right;  . BELOW KNEE LEG AMPUTATION Right 06/13/2017  . BELOW KNEE LEG AMPUTATION    . CORONARY STENT INTERVENTION N/A  11/10/2018   Procedure: CORONARY STENT INTERVENTION;  Surgeon: Leonie Man, MD;  Location: [facility];  Service: Cardiovascular;  Laterality: N/A;  . CORONARY/GRAFT ACUTE MI REVASCULARIZATION N/A 03/21/2020   Procedure: Coronary/Graft Acute MI Revascularization;  Surgeon: [name], MD;  Location: [place];  Service: Cardiovascular;  Laterality: N/A;  . HERNIA REPAIR    . I & D EXTREMITY Left 08/21/2014   Procedure: INCISION AND DRAINAGE LEFT SMALL FINGER;  Surgeon: Leanora Cover, MD;  Location: [facility];  Service: Orthopedics;  Laterality: Left;  . I & D EXTREMITY Right 03/18/2017   Procedure: IRRIGATION AND DEBRIDEMENT EXTREMITY;  Surgeon: [name], MD;  Location: [facility];  Service: Orthopedics;  Laterality: Right;  . LEFT HEART CATH AND CORONARY ANGIOGRAPHY N/A 11/10/2018   Procedure: LEFT HEART CATH AND CORONARY ANGIOGRAPHY;  Surgeon: Leonie Man, MD;  Location: [facility];  Service: Cardiovascular;  Laterality: N/A;  . LEFT HEART CATH AND CORONARY ANGIOGRAPHY N/A 03/21/2020   Procedure: LEFT HEART CATH AND CORONARY ANGIOGRAPHY;  Surgeon: [name], MD;  Location: [facility];  Service: Cardiovascular;  Laterality: N/A;  . MINOR IRRIGATION AND DEBRIDEMENT OF WOUND Right 04/22/2014   Procedure: IRRIGATION AND DEBRIDEMENT OF RIGHT NECK ABCESS;  Surgeon: Jerrell Belfast, MD;  Location: [facility];  Service: ENT;  Laterality: Right;  . MULTIPLE EXTRACTIONS WITH ALVEOLOPLASTY N/A 01/18/2013   Procedure: MULTIPLE EXTRACION 3, 6, 7, 10, 11, 13, 21, 22, 27, 28, 29, 30 WITH ALVEOLOPLASTY;  Surgeon: Gae Bon, DDS;  Location: [facility];  Service: Oral Surgery;  Laterality: N/A;  . SKIN SPLIT GRAFT Right 03/21/2017   Procedure: IRRIGATION AND DEBRIDEMENT RIGHT FOOT AND APPLY SPLIT THICKNESS SKIN GRAFT AND WOUND VAC;  Surgeon: [name], MD;  Location: [place];  Service: Orthopedics;  Laterality: Right;  . TEE WITHOUT CARDIOVERSION N/A 05/02/2017    Procedure: TRANSESOPHAGEAL ECHOCARDIOGRAM (TEE);  Surgeon: Acie Fredrickson Wonda Cheng, MD;  [facility];  Service: Cardiovascular;  Laterality: N/A;  coincidental to orthopedic case   Social  History   Occupational History  . Occupation: disabled  Tobacco Use  . Smoking status: Never Smoker  . Smokeless tobacco: Never Used  Vaping Use  . Vaping Use: Never used  Substance and Sexual Activity  . Alcohol use: Not Currently  . Drug use: Not Currently    Comment: OTC ASA  . Sexual activity: Yes    Partners: Female

## 2020-09-13 DIAGNOSIS — R69 Illness, unspecified: Secondary | ICD-10-CM | POA: Diagnosis not present

## 2020-09-13 DIAGNOSIS — G4700 Insomnia, unspecified: Secondary | ICD-10-CM | POA: Diagnosis not present

## 2020-09-13 DIAGNOSIS — F411 Generalized anxiety disorder: Secondary | ICD-10-CM | POA: Diagnosis not present

## 2020-09-16 IMAGING — DX DG TIBIA/FIBULA 2V*R*
2 series · 2 of 2 positions shown · non-contrast
Comparison: March 29, 2019

CLINICAL DATA: Ulceration along amputation stump site

EXAM:
RIGHT TIBIA AND FIBULA - 2 VIEW

[tibia ap]
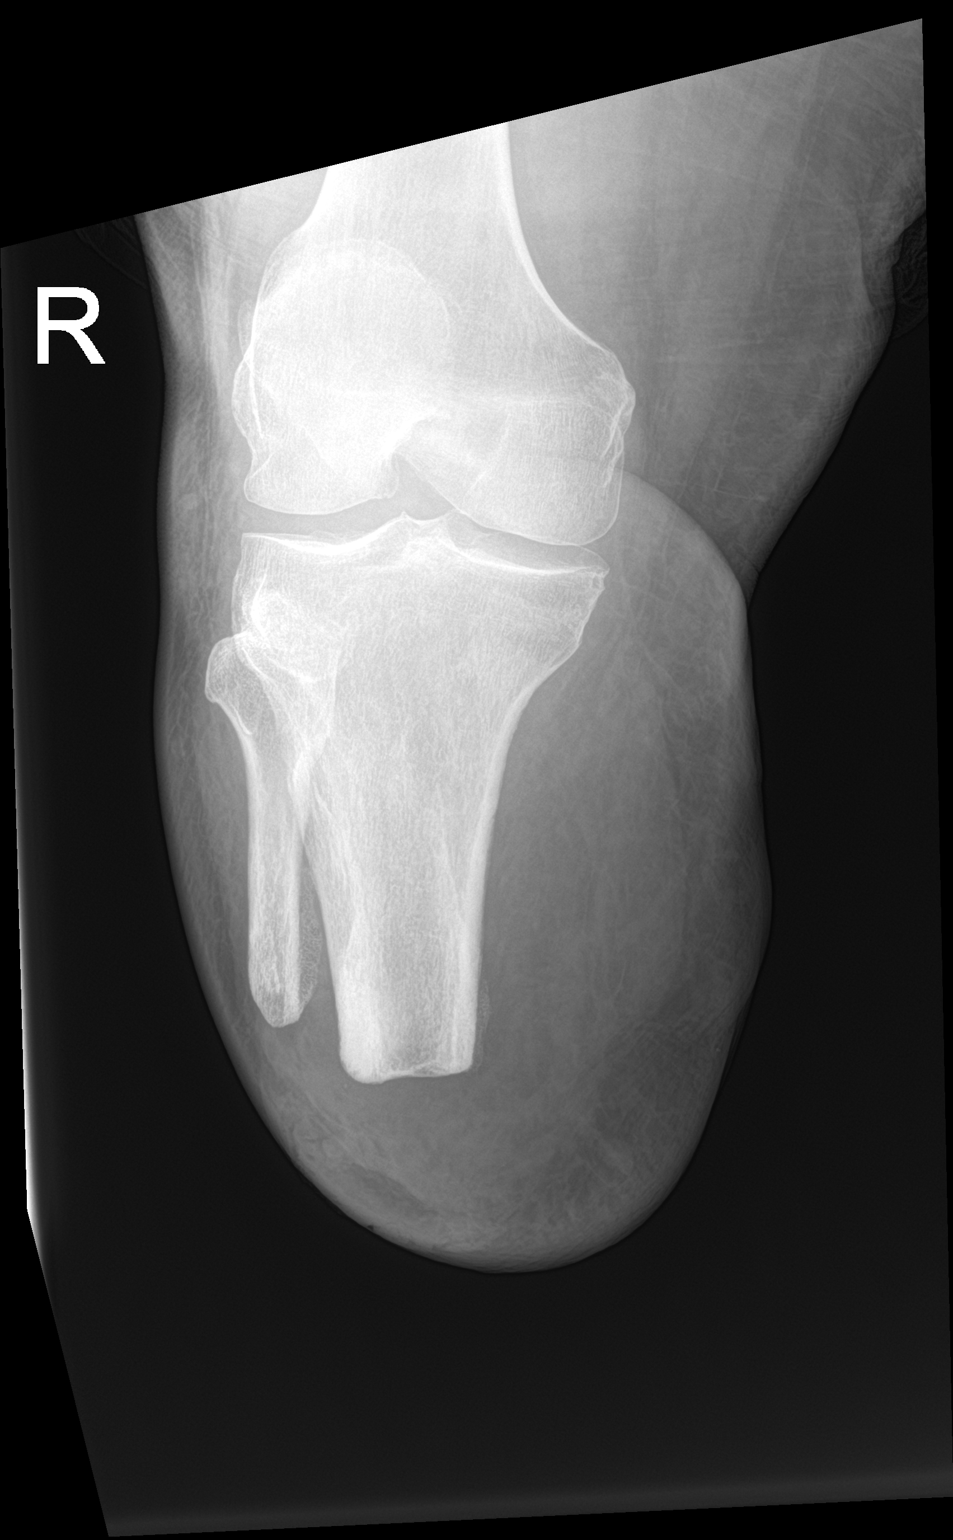

[tibia lat]
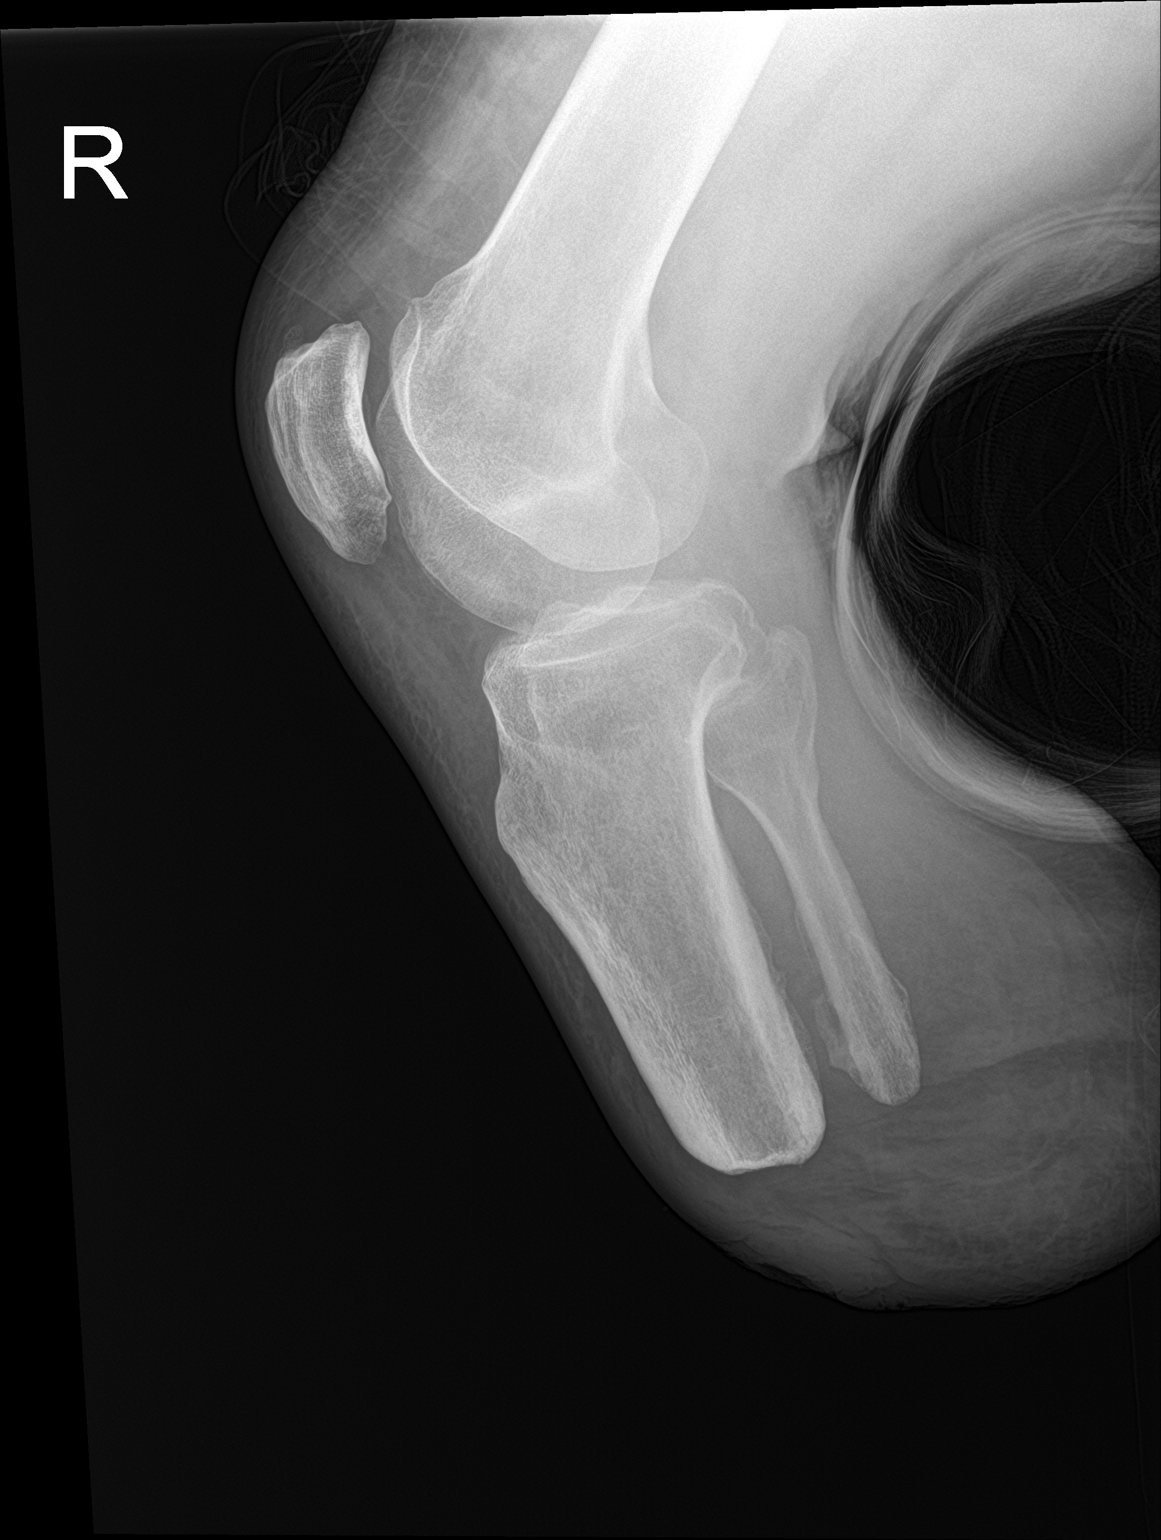

[2 of 2 positions shown; findings below may reference images not displayed]

FINDINGS: Frontal and lateral views obtained. There has been amputation at the
level of the proximal tibia and fibular diaphysis levels. There is
soft tissue irregularity in the stump region consistent with
ulceration. No radiopaque foreign body in this area. No bony
destruction or erosion. No fracture or dislocation. No appreciable
joint space narrowing. No knee joint effusion.
IMPRESSION: Status post amputation of the proximal tibia and fibula. Apparent
ulceration at stump site anteriorly. No radiopaque foreign body. No
bony erosion or bony destruction. No fracture or dislocation. No
appreciable arthropathy.

## 2020-09-18 ENCOUNTER — Telehealth: Payer: Self-pay

## 2020-09-18 ENCOUNTER — Other Ambulatory Visit: Payer: Self-pay | Admitting: Family

## 2020-09-18 DIAGNOSIS — B20 Human immunodeficiency virus [HIV] disease: Secondary | ICD-10-CM

## 2020-09-18 NOTE — Telephone Encounter (Signed)
Per [place] patient must be seen in office before home health referral is placed. Patient has an appointment 7/13. Attempted to contact patient vm full unable to leave message.

## 2020-09-18 NOTE — Telephone Encounter (Signed)
Pt needs for a nurse to help him at home [facility] health nurse)

## 2020-09-19 ENCOUNTER — Ambulatory Visit: Payer: Medicare HMO | Admitting: Orthopedic Surgery

## 2020-09-21 ENCOUNTER — Ambulatory Visit: Payer: Medicare HMO | Admitting: Infectious Diseases

## 2020-09-28 ENCOUNTER — Other Ambulatory Visit: Payer: Self-pay

## 2020-09-28 ENCOUNTER — Inpatient Hospital Stay (HOSPITAL_COMMUNITY)
Admission: EM | Admit: 2020-09-28 | Discharge: 2020-10-01 | DRG: 637 | Disposition: A | Discharge status: Home / Self Care | Payer: Medicare HMO | Attending: Hospitalist | Admitting: Hospitalist

## 2020-09-28 ENCOUNTER — Encounter (HOSPITAL_COMMUNITY): Payer: Self-pay | Admitting: Emergency Medicine

## 2020-09-28 ENCOUNTER — Other Ambulatory Visit: Payer: Self-pay | Admitting: *Deleted

## 2020-09-28 DIAGNOSIS — E1165 Type 2 diabetes mellitus with hyperglycemia: Secondary | ICD-10-CM | POA: Diagnosis not present

## 2020-09-28 DIAGNOSIS — L97221 Non-pressure chronic ulcer of left calf limited to breakdown of skin: Secondary | ICD-10-CM | POA: Diagnosis present

## 2020-09-28 DIAGNOSIS — B20 Human immunodeficiency virus [HIV] disease: Secondary | ICD-10-CM | POA: Diagnosis present

## 2020-09-28 DIAGNOSIS — Z20822 Contact with and (suspected) exposure to covid-19: Secondary | ICD-10-CM | POA: Diagnosis not present

## 2020-09-28 DIAGNOSIS — N184 Chronic kidney disease, stage 4 (severe): Secondary | ICD-10-CM | POA: Diagnosis not present

## 2020-09-28 DIAGNOSIS — E1122 Type 2 diabetes mellitus with diabetic chronic kidney disease: Secondary | ICD-10-CM | POA: Diagnosis present

## 2020-09-28 DIAGNOSIS — N189 Chronic kidney disease, unspecified: Secondary | ICD-10-CM

## 2020-09-28 DIAGNOSIS — I10 Essential (primary) hypertension: Secondary | ICD-10-CM | POA: Diagnosis present

## 2020-09-28 DIAGNOSIS — Z7902 Long term (current) use of antithrombotics/antiplatelets: Secondary | ICD-10-CM

## 2020-09-28 DIAGNOSIS — Z9114 Patient's other noncompliance with medication regimen: Secondary | ICD-10-CM

## 2020-09-28 DIAGNOSIS — G8929 Other chronic pain: Secondary | ICD-10-CM | POA: Diagnosis present

## 2020-09-28 DIAGNOSIS — I252 Old myocardial infarction: Secondary | ICD-10-CM

## 2020-09-28 DIAGNOSIS — E1100 Type 2 diabetes mellitus with hyperosmolarity without nonketotic hyperglycemic-hyperosmolar coma (NKHHC): Secondary | ICD-10-CM | POA: Diagnosis not present

## 2020-09-28 DIAGNOSIS — E785 Hyperlipidemia, unspecified: Secondary | ICD-10-CM | POA: Diagnosis present

## 2020-09-28 DIAGNOSIS — Z809 Family history of malignant neoplasm, unspecified: Secondary | ICD-10-CM

## 2020-09-28 DIAGNOSIS — Z8673 Personal history of transient ischemic attack (TIA), and cerebral infarction without residual deficits: Secondary | ICD-10-CM

## 2020-09-28 DIAGNOSIS — Z818 Family history of other mental and behavioral disorders: Secondary | ICD-10-CM

## 2020-09-28 DIAGNOSIS — Z882 Allergy status to sulfonamides status: Secondary | ICD-10-CM

## 2020-09-28 DIAGNOSIS — Z955 Presence of coronary angioplasty implant and graft: Secondary | ICD-10-CM

## 2020-09-28 DIAGNOSIS — R69 Illness, unspecified: Secondary | ICD-10-CM | POA: Diagnosis not present

## 2020-09-28 DIAGNOSIS — I129 Hypertensive chronic kidney disease with stage 1 through stage 4 chronic kidney disease, or unspecified chronic kidney disease: Secondary | ICD-10-CM | POA: Diagnosis present

## 2020-09-28 DIAGNOSIS — R739 Hyperglycemia, unspecified: Secondary | ICD-10-CM | POA: Diagnosis present

## 2020-09-28 DIAGNOSIS — Z8249 Family history of ischemic heart disease and other diseases of the circulatory system: Secondary | ICD-10-CM

## 2020-09-28 DIAGNOSIS — Z7984 Long term (current) use of oral hypoglycemic drugs: Secondary | ICD-10-CM

## 2020-09-28 DIAGNOSIS — G9341 Metabolic encephalopathy: Secondary | ICD-10-CM | POA: Diagnosis present

## 2020-09-28 DIAGNOSIS — I83022 Varicose veins of left lower extremity with ulcer of calf: Secondary | ICD-10-CM | POA: Diagnosis present

## 2020-09-28 DIAGNOSIS — F419 Anxiety disorder, unspecified: Secondary | ICD-10-CM | POA: Diagnosis present

## 2020-09-28 DIAGNOSIS — D631 Anemia in chronic kidney disease: Secondary | ICD-10-CM | POA: Diagnosis not present

## 2020-09-28 DIAGNOSIS — R0789 Other chest pain: Secondary | ICD-10-CM | POA: Diagnosis present

## 2020-09-28 DIAGNOSIS — Z79899 Other long term (current) drug therapy: Secondary | ICD-10-CM

## 2020-09-28 DIAGNOSIS — N179 Acute kidney failure, unspecified: Secondary | ICD-10-CM

## 2020-09-28 DIAGNOSIS — I2510 Atherosclerotic heart disease of native coronary artery without angina pectoris: Secondary | ICD-10-CM | POA: Diagnosis present

## 2020-09-28 DIAGNOSIS — Z794 Long term (current) use of insulin: Secondary | ICD-10-CM

## 2020-09-28 DIAGNOSIS — E871 Hypo-osmolality and hyponatremia: Secondary | ICD-10-CM | POA: Diagnosis present

## 2020-09-28 DIAGNOSIS — Z8261 Family history of arthritis: Secondary | ICD-10-CM

## 2020-09-28 DIAGNOSIS — Z89511 Acquired absence of right leg below knee: Secondary | ICD-10-CM

## 2020-09-28 DIAGNOSIS — R079 Chest pain, unspecified: Secondary | ICD-10-CM | POA: Diagnosis not present

## 2020-09-28 DIAGNOSIS — I872 Venous insufficiency (chronic) (peripheral): Secondary | ICD-10-CM | POA: Diagnosis present

## 2020-09-28 LAB — CBG MONITORING, ED: Glucose-Capillary: >600 mg/dL (ref 70–99)

## 2020-09-28 NOTE — Patient Outreach (Signed)
[facility]) Care Management  09/28/2020  TYGE NEARHOOF 03-26-1966 [id number]   Telephone Assessment-Unsuccessful #1  RN attempted outreach call today however unsuccessful. RN able to leave a HIPAA approved voice message requesting a call back.  [facility] will attempt another outreach over the next week for pending services. Will also send outreach letter.  [name], RN Care Management Coordinator [place] Office [phone]

## 2020-09-28 NOTE — ED Triage Notes (Signed)
Pt c/o intermittent right sided chest pain x 1 week. Pt also c/o elevated blood sugars.

## 2020-09-28 NOTE — ED Provider Notes (Signed)
Emergency Medicine Provider Triage Evaluation Note  Keith [name] , a 55 y.o. male  was evaluated in triage.  Pt complains of right sided chest pain x 1 week. Pain occurs intermittently, worse when he moves his right arm at times, no other alleviating/aggravating factors. Sharp in nature. Associated dyspnea & hyperglycemia @ home.  Review of Systems  Positive: Chest pain, dyspnea, hyperglycemia Negative: Syncope, diaphoresis, abdominal pain.   Physical Exam  BP (!) 145/91 (BP Location: Right Arm)   Pulse 91   Temp 98.4 F (36.9 C) (Oral)   Resp (!) 24   SpO2 95%  Gen:   Awake, no distress   Resp:  Normal effort  Cardiac:  RRR Chest:   Right anterior chest wall tenderness to palpation.   Medical Decision Making  Medically screening exam initiated at 11:52 PM.  Appropriate orders placed.  Keith [name] was informed that the remainder of the evaluation will be completed by another provider, this initial triage assessment does not replace that evaluation, and the importance of remaining in the ED until their evaluation is complete.  Chest pain.    Leafy Kindle 09/28/20 [id number]    Fatima Blank, MD 10/07/20 1807

## 2020-09-29 ENCOUNTER — Emergency Department (HOSPITAL_COMMUNITY): Payer: Medicare HMO

## 2020-09-29 DIAGNOSIS — R8290 Unspecified abnormal findings in urine: Secondary | ICD-10-CM

## 2020-09-29 DIAGNOSIS — R0789 Other chest pain: Secondary | ICD-10-CM | POA: Diagnosis not present

## 2020-09-29 DIAGNOSIS — G9341 Metabolic encephalopathy: Secondary | ICD-10-CM | POA: Diagnosis not present

## 2020-09-29 DIAGNOSIS — Z89511 Acquired absence of right leg below knee: Secondary | ICD-10-CM

## 2020-09-29 DIAGNOSIS — N179 Acute kidney failure, unspecified: Secondary | ICD-10-CM | POA: Diagnosis not present

## 2020-09-29 DIAGNOSIS — E1165 Type 2 diabetes mellitus with hyperglycemia: Secondary | ICD-10-CM | POA: Diagnosis not present

## 2020-09-29 DIAGNOSIS — N189 Chronic kidney disease, unspecified: Secondary | ICD-10-CM

## 2020-09-29 DIAGNOSIS — E871 Hypo-osmolality and hyponatremia: Secondary | ICD-10-CM | POA: Diagnosis not present

## 2020-09-29 DIAGNOSIS — I872 Venous insufficiency (chronic) (peripheral): Secondary | ICD-10-CM | POA: Diagnosis not present

## 2020-09-29 DIAGNOSIS — E1100 Type 2 diabetes mellitus with hyperosmolarity without nonketotic hyperglycemic-hyperosmolar coma (NKHHC): Secondary | ICD-10-CM | POA: Diagnosis not present

## 2020-09-29 DIAGNOSIS — I10 Essential (primary) hypertension: Secondary | ICD-10-CM | POA: Diagnosis not present

## 2020-09-29 DIAGNOSIS — R079 Chest pain, unspecified: Secondary | ICD-10-CM | POA: Diagnosis not present

## 2020-09-29 DIAGNOSIS — L97221 Non-pressure chronic ulcer of left calf limited to breakdown of skin: Secondary | ICD-10-CM

## 2020-09-29 LAB — GLUCOSE, CAPILLARY
Glucose-Capillary: 186 mg/dL — ABNORMAL HIGH (ref 70–99)
Glucose-Capillary: 147 mg/dL — ABNORMAL HIGH (ref 70–99)
Glucose-Capillary: 175 mg/dL — ABNORMAL HIGH (ref 70–99)
Glucose-Capillary: 219 mg/dL — ABNORMAL HIGH (ref 70–99)
Glucose-Capillary: 218 mg/dL — ABNORMAL HIGH (ref 70–99)
Glucose-Capillary: 207 mg/dL — ABNORMAL HIGH (ref 70–99)
Glucose-Capillary: 190 mg/dL — ABNORMAL HIGH (ref 70–99)
Glucose-Capillary: 215 mg/dL — ABNORMAL HIGH (ref 70–99)
Glucose-Capillary: 262 mg/dL — ABNORMAL HIGH (ref 70–99)
Glucose-Capillary: 203 mg/dL — ABNORMAL HIGH (ref 70–99)

## 2020-09-29 LAB — CBG MONITORING, ED
Glucose-Capillary: >600 mg/dL (ref 70–99)
Glucose-Capillary: >600 mg/dL (ref 70–99)
Glucose-Capillary: 487 mg/dL — ABNORMAL HIGH (ref 70–99)
Glucose-Capillary: >600 mg/dL (ref 70–99)
Glucose-Capillary: 520 mg/dL (ref 70–99)
Glucose-Capillary: 443 mg/dL — ABNORMAL HIGH (ref 70–99)
Glucose-Capillary: 339 mg/dL — ABNORMAL HIGH (ref 70–99)
Glucose-Capillary: 326 mg/dL — ABNORMAL HIGH (ref 70–99)

## 2020-09-29 LAB — BASIC METABOLIC PANEL
Anion gap: 7 (ref 5–15)
Anion gap: 7 (ref 5–15)
BUN: 31 mg/dL — ABNORMAL HIGH (ref 6–20)
BUN: 26 mg/dL — ABNORMAL HIGH (ref 6–20)
CO2: 23 mmol/L (ref 22–32)
CO2: 25 mmol/L (ref 22–32)
Calcium: 8.9 mg/dL (ref 8.9–10.3)
Calcium: 9.2 mg/dL (ref 8.9–10.3)
Chloride: 95 mmol/L — ABNORMAL LOW (ref 98–111)
Chloride: 100 mmol/L (ref 98–111)
Creatinine, Ser: 2.16 mg/dL — ABNORMAL HIGH (ref 0.61–1.24)
Creatinine, Ser: 2.01 mg/dL — ABNORMAL HIGH (ref 0.61–1.24)
GFR, Estimated: 35 mL/min — ABNORMAL LOW (ref >60)
GFR, Estimated: 38 mL/min — ABNORMAL LOW (ref >60)
Glucose, Bld: 500 mg/dL — ABNORMAL HIGH (ref 70–99)
Glucose, Bld: 193 mg/dL — ABNORMAL HIGH (ref 70–99)
Potassium: 3.8 mmol/L (ref 3.5–5.1)
Potassium: 4 mmol/L (ref 3.5–5.1)
Sodium: 125 mmol/L — ABNORMAL LOW (ref 135–145)
Sodium: 132 mmol/L — ABNORMAL LOW (ref 135–145)

## 2020-09-29 LAB — CBC
HCT: 37.5 % — ABNORMAL LOW (ref 39.0–52.0)
Hemoglobin: 11.9 g/dL — ABNORMAL LOW (ref 13.0–17.0)
MCH: 26.5 pg (ref 26.0–34.0)
MCHC: 31.7 g/dL (ref 30.0–36.0)
MCV: 83.5 fL (ref 80.0–100.0)
Platelets: 263 10*3/uL (ref 150–400)
RBC: 4.49 MIL/uL (ref 4.22–5.81)
RDW: 12.6 % (ref 11.5–15.5)
WBC: 7 10*3/uL (ref 4.0–10.5)
nRBC: 0 % (ref 0.0–0.2)

## 2020-09-29 LAB — URINALYSIS, ROUTINE W REFLEX MICROSCOPIC
Bilirubin Urine: NEGATIVE
Glucose, UA: >=500 mg/dL — AB
Ketones, ur: NEGATIVE mg/dL
Nitrite: NEGATIVE
Protein, ur: 100 mg/dL — AB
Specific Gravity, Urine: 1.018 (ref 1.005–1.030)
pH: 5 (ref 5.0–8.0)

## 2020-09-29 LAB — SARS CORONAVIRUS 2 (TAT 6-24 HRS): SARS Coronavirus 2: NEGATIVE

## 2020-09-29 LAB — OSMOLALITY: Osmolality: 309 mOsm/kg — ABNORMAL HIGH (ref 275–295)

## 2020-09-29 LAB — COMPREHENSIVE METABOLIC PANEL
ALT: 15 U/L (ref 0–44)
AST: 16 U/L (ref 15–41)
Albumin: 3.1 g/dL — ABNORMAL LOW (ref 3.5–5.0)
Alkaline Phosphatase: 123 U/L (ref 38–126)
Anion gap: 11 (ref 5–15)
BUN: 35 mg/dL — ABNORMAL HIGH (ref 6–20)
CO2: 22 mmol/L (ref 22–32)
Calcium: 9 mg/dL (ref 8.9–10.3)
Chloride: 86 mmol/L — ABNORMAL LOW (ref 98–111)
Creatinine, Ser: 2.44 mg/dL — ABNORMAL HIGH (ref 0.61–1.24)
GFR, Estimated: 30 mL/min — ABNORMAL LOW (ref >60)
Glucose, Bld: 855 mg/dL (ref 70–99)
Potassium: 4.5 mmol/L (ref 3.5–5.1)
Sodium: 119 mmol/L — CL (ref 135–145)
Total Bilirubin: 0.6 mg/dL (ref 0.3–1.2)
Total Protein: 7.6 g/dL (ref 6.5–8.1)

## 2020-09-29 LAB — TROPONIN I (HIGH SENSITIVITY)
Troponin I (High Sensitivity): 13 ng/L (ref <18)
Troponin I (High Sensitivity): 14 ng/L (ref <18)

## 2020-09-29 LAB — HEMOGLOBIN AND HEMATOCRIT, BLOOD
HCT: 36.3 % — ABNORMAL LOW (ref 39.0–52.0)
Hemoglobin: 11.8 g/dL — ABNORMAL LOW (ref 13.0–17.0)

## 2020-09-29 LAB — D-DIMER, QUANTITATIVE: D-Dimer, Quant: 1.07 ug/mL-FEU — ABNORMAL HIGH (ref 0.00–0.50)

## 2020-09-29 LAB — MRSA NEXT GEN BY PCR, NASAL: MRSA by PCR Next Gen: NOT DETECTED

## 2020-09-29 MED ORDER — OXYBUTYNIN CHLORIDE 5 MG PO TABS
5.0000 mg | ORAL_TABLET | Freq: Two times a day (BID) | ORAL | Status: DC
  Administered 2020-09-29 – 2020-09-30 (×2): 5 mg via ORAL
  Filled 2020-09-29 (×3): qty 1

## 2020-09-29 MED ORDER — INSULIN GLARGINE 100 UNIT/ML ~~LOC~~ SOLN: 30.0000 [IU] | Freq: Every day | SUBCUTANEOUS | Status: DC

## 2020-09-29 MED ORDER — INSULIN REGULAR(HUMAN) IN NACL 100-0.9 UT/100ML-% IV SOLN
INTRAVENOUS | Status: DC
  Administered 2020-09-29: 5 [IU]/h via INTRAVENOUS
  Filled 2020-09-29: qty 100

## 2020-09-29 MED ORDER — TICAGRELOR 90 MG PO TABS
90.0000 mg | ORAL_TABLET | Freq: Two times a day (BID) | ORAL | Status: DC
  Administered 2020-09-29 – 2020-09-30 (×2): 90 mg via ORAL
  Filled 2020-09-29 (×3): qty 1

## 2020-09-29 MED ORDER — SODIUM CHLORIDE 0.9 % IV SOLN
1.0000 g | INTRAVENOUS | Status: DC
  Administered 2020-09-29: 1 g via INTRAVENOUS
  Filled 2020-09-29 (×2): qty 10

## 2020-09-29 MED ORDER — ATORVASTATIN CALCIUM 80 MG PO TABS
80.0000 mg | ORAL_TABLET | Freq: Every day | ORAL | Status: DC
  Administered 2020-09-29: 80 mg via ORAL
  Filled 2020-09-29 (×2): qty 1

## 2020-09-29 MED ORDER — ICOSAPENT ETHYL 1 G PO CAPS
2.0000 g | ORAL_CAPSULE | Freq: Two times a day (BID) | ORAL | Status: DC
  Administered 2020-09-29: 2 g via ORAL
  Filled 2020-09-29 (×6): qty 2

## 2020-09-29 MED ORDER — DEXTROSE 50 % IV SOLN: 0.0000 mL | INTRAVENOUS | Status: DC | PRN

## 2020-09-29 MED ORDER — DEXTROSE IN LACTATED RINGERS 5 % IV SOLN: INTRAVENOUS | Status: DC

## 2020-09-29 MED ORDER — ALPRAZOLAM 0.5 MG PO TABS: 0.5000 mg | ORAL_TABLET | Freq: Every day | ORAL | Status: DC | PRN

## 2020-09-29 MED ORDER — SODIUM CHLORIDE 0.9 % IV BOLUS
1000.0000 mL | INTRAVENOUS | Status: AC
  Administered 2020-09-29 (×2): 1000 mL via INTRAVENOUS

## 2020-09-29 MED ORDER — AMLODIPINE BESYLATE 10 MG PO TABS
10.0000 mg | ORAL_TABLET | Freq: Every day | ORAL | Status: DC
  Administered 2020-09-29: 10 mg via ORAL
  Filled 2020-09-29 (×2): qty 1

## 2020-09-29 MED ORDER — TRAZODONE HCL 100 MG PO TABS
100.0000 mg | ORAL_TABLET | Freq: Every day | ORAL | Status: DC
  Administered 2020-09-29: 100 mg via ORAL
  Filled 2020-09-29 (×2): qty 1

## 2020-09-29 MED ORDER — SODIUM CHLORIDE 0.9 % IV SOLN: INTRAVENOUS | Status: DC

## 2020-09-29 MED ORDER — INSULIN ASPART 100 UNIT/ML IJ SOLN: 0.0000 [IU] | Freq: Three times a day (TID) | INTRAMUSCULAR | Status: DC

## 2020-09-29 MED ORDER — POTASSIUM CHLORIDE 10 MEQ/100ML IV SOLN
10.0000 meq | INTRAVENOUS | Status: AC
Start: 2020-09-29 — End: 2020-09-29
  Administered 2020-09-29 (×2): 10 meq via INTRAVENOUS
  Filled 2020-09-29 (×2): qty 100

## 2020-09-29 MED ORDER — CARVEDILOL 25 MG PO TABS
25.0000 mg | ORAL_TABLET | Freq: Two times a day (BID) | ORAL | Status: DC
  Filled 2020-09-29 (×2): qty 1

## 2020-09-29 MED ORDER — ALBUTEROL SULFATE (2.5 MG/3ML) 0.083% IN NEBU: 2.5000 mg | INHALATION_SOLUTION | Freq: Four times a day (QID) | RESPIRATORY_TRACT | Status: DC | PRN

## 2020-09-29 MED ORDER — ASPIRIN EC 81 MG PO TBEC
81.0000 mg | DELAYED_RELEASE_TABLET | Freq: Every day | ORAL | Status: DC
  Administered 2020-09-29: 81 mg via ORAL
  Filled 2020-09-29 (×2): qty 1

## 2020-09-29 MED ORDER — HEPARIN SODIUM (PORCINE) 5000 UNIT/ML IJ SOLN
5000.0000 [IU] | Freq: Three times a day (TID) | INTRAMUSCULAR | Status: DC
  Administered 2020-09-29: 5000 [IU] via SUBCUTANEOUS
  Filled 2020-09-29: qty 1

## 2020-09-29 MED ORDER — GABAPENTIN 100 MG PO CAPS
100.0000 mg | ORAL_CAPSULE | Freq: Three times a day (TID) | ORAL | Status: DC
  Administered 2020-09-29: 100 mg via ORAL
  Filled 2020-09-29 (×3): qty 1

## 2020-09-29 MED ORDER — LACTATED RINGERS IV BOLUS
20.0000 mL/kg | Freq: Once | INTRAVENOUS | Status: AC
  Administered 2020-09-29: 2450 mL via INTRAVENOUS

## 2020-09-29 MED ORDER — ESCITALOPRAM OXALATE 20 MG PO TABS
20.0000 mg | ORAL_TABLET | Freq: Every day | ORAL | Status: DC
  Administered 2020-09-29: 20 mg via ORAL
  Filled 2020-09-29 (×2): qty 1

## 2020-09-29 MED ORDER — INSULIN ASPART 100 UNIT/ML IJ SOLN: 10.0000 [IU] | Freq: Three times a day (TID) | INTRAMUSCULAR | Status: DC

## 2020-09-29 MED ORDER — ENOXAPARIN SODIUM 120 MG/0.8ML IJ SOSY
120.0000 mg | PREFILLED_SYRINGE | Freq: Two times a day (BID) | INTRAMUSCULAR | Status: DC
  Administered 2020-09-29 – 2020-09-30 (×2): 120 mg via SUBCUTANEOUS
  Filled 2020-09-29 (×2): qty 0.8

## 2020-09-29 MED ORDER — INSULIN GLARGINE 100 UNIT/ML ~~LOC~~ SOLN
20.0000 [IU] | Freq: Every day | SUBCUTANEOUS | Status: DC
  Administered 2020-09-29: 20 [IU] via SUBCUTANEOUS
  Filled 2020-09-29 (×2): qty 0.2

## 2020-09-29 MED ORDER — BICTEGRAVIR-EMTRICITAB-TENOFOV 50-200-25 MG PO TABS
1.0000 | ORAL_TABLET | Freq: Every day | ORAL | Status: DC
  Administered 2020-09-29: 1 via ORAL
  Filled 2020-09-29 (×3): qty 1

## 2020-09-29 MED ORDER — OXYCODONE HCL 5 MG PO TABS
10.0000 mg | ORAL_TABLET | Freq: Four times a day (QID) | ORAL | Status: DC | PRN
  Administered 2020-09-30: 10 mg via ORAL
  Filled 2020-09-29: qty 2

## 2020-09-29 NOTE — H&P (Addendum)
History and Physical    Keith [name] MRN:[id number] DOB: 09/01/1965 DOA: 09/28/2020  Referring MD/NP/PA: Vasundhra Rathore, MD PCP: Pcp, No  Patient coming from: Home (lives with his mother) he  Chief Complaint: Chest pain and elevated blood sugar  I have personally briefly reviewed patient's old medical records in [facility]   HPI: Keith [name] is a 55 y.o. male with medical history significant of HTN, HLD, CAD s/p stenting, DM type II uncontrolled, AIDS, CKD, CVA, chronic pain, chronic ulcer of the left leg, and s/p right BKA presents with complaints of chest pain and elevated blood sugar over the last 1-2 weeks.  History is somewhat limited as the patient is lethargic.  He has been having right-sided chest pain that he reports is severe and has become constant over the last week.  Notes associated symptoms of shortness of breath unable to walk more than 5-10 feet without needing to rest.  At baseline he uses a walker to ambulate.  He states that he has been given himself 20 units of insulin with meals, but states that he is not on any long-acting insulin.  Associated symptoms include lower extremity swelling, urinary frequency, nausea, intermittent vomiting, lightheadedness, and weakness.  Denies any loss of consciousness, falls, fever, dysuria, or diarrhea.  He had just recently been hospitalized 5/24-5/27 with hyperglycemia and suspected AKI on CKD versus progression of CKD.  He was discharged to a SNF it seems and patient reports that he was not there long enough.  He had followed up with orthopedics on 6/7 and was told that the wound was healing.  He was advised to continue the double extra-large compression sock and to wear it 24 hours only taking off to shower and dry in place.  Patient states he still been having clear drainage, but denies any significant redness or erythema on the wound.  He  ED Course: Upon admission into the emergency department patient was seen to be afebrile,  pulse 74- 91, respirations 11-24, blood pressure 145/91 and 168/97, and O2 saturations maintained.  Labs significant for hemoglobin 11.9, sodium 119, chloride 86, CO2 22, BUN 35, creatinine 2.44, glucose 855, anion gap 11, and high-sensitivity troponins negative x2.  Chest x-ray showed no acute abnormalities.  Urinalysis was significant for glucose without ketones, small leukocytes, negative nitrites, few bacteria, and 21-50 WBCs.  Patient was given 1 L normal saline IV fluids and started on insulin drip.  COVID-19 screening was pending.  Review of Systems  Constitutional:  Positive for malaise/fatigue. Negative for fever.  HENT:  Negative for congestion and sore throat.   Eyes:  Negative for photophobia.  Respiratory:  Positive for shortness of breath. Negative for cough.   Cardiovascular:  Positive for chest pain and leg swelling.  Gastrointestinal:  Positive for nausea and vomiting. Negative for diarrhea.  Genitourinary:  Positive for frequency. Negative for dysuria.  Musculoskeletal:  Negative for falls.  Skin:        Ulcer of the left leg  Neurological:  Positive for dizziness. Negative for focal weakness and loss of consciousness.  Psychiatric/Behavioral:  Negative for memory loss and substance abuse.    Past Medical History:  Diagnosis Date   Abscess of right foot    abscess/ulcer of R transtibial amputation requiring IV abx   AIDS (HCC)    Anemia    CAD (coronary artery disease)    a. MI with stenting of OM1 in 11/2018 with residual disease. b. acute STEMI 03/2020 s/p DES to   OM1   Chronic anemia    Chronic knee pain    right   Chronic pain    CKD (chronic kidney disease), stage IV (HCC)    CVA (cerebral vascular accident) (HCC) 04/2020   Diabetes type 2, uncontrolled (HCC)    HgA1c 17.6 (04/27/2010)   Diabetic foot ulcer (HCC) 01/2017   right foot   Dilatation of aorta (HCC)    Erectile dysfunction    Genital warts    HIV (human immunodeficiency virus infection) (HCC) 2009    CD4 count 100, VL 13800 (05/01/2010)   Hyperlipidemia    Hypertension    Noncompliance with medication regimen    Osteomyelitis (HCC)    h/o hand   Osteomyelitis of metatarsal (HCC) 04/28/2017    Past Surgical History:  Procedure Laterality Date   AMPUTATION Right 05/02/2017   Procedure: AMPUTATION TRANSMETARSAL;  Surgeon: Duda, Marcus V, MD;  Location: [facility];  Service: Orthopedics;  Laterality: Right;   AMPUTATION Right 06/13/2017   Procedure: RIGHT BELOW KNEE AMPUTATION;  Surgeon: Duda, Marcus V, MD;  Location: [facility];  Service: Orthopedics;  Laterality: Right;   BELOW KNEE LEG AMPUTATION Right 06/13/2017   BELOW KNEE LEG AMPUTATION     CORONARY STENT INTERVENTION N/A 11/10/2018   Procedure: CORONARY STENT INTERVENTION;  Surgeon: Harding, David W, MD;  Location: [facility];  Service: Cardiovascular;  Laterality: N/A;   CORONARY/GRAFT ACUTE MI REVASCULARIZATION N/A 03/21/2020   Procedure: Coronary/Graft Acute MI Revascularization;  Surgeon: Berry, Jonathan J, MD;  Location: [facility];  Service: Cardiovascular;  Laterality: N/A;   HERNIA REPAIR     I & D EXTREMITY Left 08/21/2014   Procedure: INCISION AND DRAINAGE LEFT SMALL FINGER;  Surgeon: Kevin Kuzma, MD;  Location: [facility];  Service: Orthopedics;  Laterality: Left;   I & D EXTREMITY Right 03/18/2017   Procedure: IRRIGATION AND DEBRIDEMENT EXTREMITY;  Surgeon: Duda, Marcus V, MD;  Location: [facility];  Service: Orthopedics;  Laterality: Right;   LEFT HEART CATH AND CORONARY ANGIOGRAPHY N/A 11/10/2018   Procedure: LEFT HEART CATH AND CORONARY ANGIOGRAPHY;  Surgeon: Harding, David W, MD;  [facility];  Service: Cardiovascular;  Laterality: N/A;   LEFT HEART CATH AND CORONARY ANGIOGRAPHY N/A 03/21/2020   Procedure: LEFT HEART CATH AND CORONARY ANGIOGRAPHY;  Surgeon: Berry, Jonathan J, MD;  [facility];  Service: Cardiovascular;  Laterality: N/A;   MINOR IRRIGATION AND DEBRIDEMENT OF WOUND Right  04/22/2014   Procedure: IRRIGATION AND DEBRIDEMENT OF RIGHT NECK ABCESS;  Surgeon: David Shoemaker, MD;  Location: [facility];  Service: ENT;  Laterality: Right;   MULTIPLE EXTRACTIONS WITH ALVEOLOPLASTY N/A 01/18/2013   Procedure: MULTIPLE EXTRACION 3, 6, 7, 10, 11, 13, 21, 22, 27, 28, 29, 30 WITH ALVEOLOPLASTY;  Surgeon: Scott M Jensen, DDS;  Location: [facility];  Service: Oral Surgery;  Laterality: N/A;   SKIN SPLIT GRAFT Right 03/21/2017   Procedure: IRRIGATION AND DEBRIDEMENT RIGHT FOOT AND APPLY SPLIT THICKNESS SKIN GRAFT AND WOUND VAC;  Surgeon: Duda, Marcus V, MD;  Location: [facility];  Service: Orthopedics;  Laterality: Right;   TEE WITHOUT CARDIOVERSION N/A 05/02/2017   Procedure: TRANSESOPHAGEAL ECHOCARDIOGRAM (TEE);  Surgeon: Nahser, Philip J, MD;  Location: [facility];  Service: Cardiovascular;  Laterality: N/A;  coincidental to orthopedic case     reports that he has never smoked. He has never used smokeless tobacco. He reports previous alcohol use. He reports previous drug use.  Allergies  Allergen Reactions     Elemental Sulfur Itching    Patient stated he's allergic to "sulfur" AND "sulfa"   Sulfa Antibiotics Itching    Family History  Problem Relation Age of Onset   Hypertension Mother    Arthritis Father    Hypertension Father    Hypertension Brother    Cancer Maternal Grandmother 70       unknown type of cancer   Depression Paternal Grandmother     Prior to Admission medications   Medication Sig Start Date End Date Taking? Authorizing Provider  acetaminophen (TYLENOL) 325 MG tablet Take 2 tablets (650 mg total) by mouth every 4 (four) hours as needed for mild pain, fever or headache. 05/03/20   Rizwan, Saima, MD  allopurinol (ZYLOPRIM) 300 MG tablet Take 300 mg by mouth daily. 06/09/20   [provider]  ALPRAZolam (XANAX) 1 MG tablet Take 1 mg by mouth daily as needed for anxiety. 08/15/20   [provider]  amLODipine (NORVASC) 10 MG tablet Take 1 tablet (10 mg total)  by mouth daily. 09/01/20 10/31/20  Kc, Ramesh, MD  aspirin 81 MG EC tablet Take 1 tablet (81 mg total) by mouth daily. 03/27/20   Berry, Jonathan J, MD  atorvastatin (LIPITOR) 80 MG tablet Take 1 tablet (80 mg total) by mouth daily. 09/01/20 10/31/20  Kc, Ramesh, MD  bictegravir-emtricitabine-tenofovir AF (BIKTARVY) 50-200-25 MG TABS tablet Take 1 tablet by mouth daily. 05/19/20   Hatcher, Jeffrey C, MD  blood glucose meter kit and supplies Dispense based on patient and insurance preference. Use up to four times daily as directed. (FOR ICD-10 E10.9, E11.9). Patient taking differently: 1 each by Other route See admin instructions. Dispense based on patient and insurance preference. Use up to four times daily as directed. (FOR ICD-10 E10.9, E11.9). 07/17/20   Stroud, Natalie M, FNP  carvedilol (COREG) 25 MG tablet Take 1 tablet (25 mg total) by mouth 2 (two) times daily with a meal. 09/01/20 10/31/20  Kc, Ramesh, MD  dapsone 100 MG tablet TAKE 1 TABLET (100 MG TOTAL) BY MOUTH DAILY. Patient taking differently: Take 100 mg by mouth daily. 03/27/20 03/27/21  [name], Ava, DO  EASY COMFORT PEN NEEDLES 31G X 5 MM MISC INJECT 20 UNITS DAILY AS DIRECTED. Patient taking differently: Inject 20 Units into the skin daily. 11/10/19   Stroud, Natalie M, FNP  escitalopram (LEXAPRO) 20 MG tablet Take 20 mg by mouth daily. 08/15/20   [provider]  gabapentin (NEURONTIN) 100 MG capsule Take 1 capsule (100 mg total) by mouth 3 (three) times daily. 09/01/20 10/31/20  Kc, Ramesh, MD  glucose blood (COOL BLOOD GLUCOSE TEST STRIPS) test strip Use as instructed Patient taking differently: 1 each by Other route as directed. 02/16/20   Stroud, Natalie M, FNP  icosapent Ethyl (VASCEPA) 1 g capsule TAKE 2 CAPSULES (2 G TOTAL) BY MOUTH TWO TIMES DAILY. Patient taking differently: Take 2 g by mouth 2 (two) times daily. 03/27/20 03/27/21  Berry, Jonathan J, MD  insulin aspart (NOVOLOG) 100 UNIT/ML FlexPen Inject 10 Units into the  skin 3 (three) times daily with meals. 09/01/20 10/31/20  Kc, Ramesh, MD  insulin glargine (LANTUS) 100 UNIT/ML injection Inject 0.4 mLs (40 Units total) into the skin at bedtime. 09/01/20 10/31/20  Kc, Ramesh, MD  Insulin Pen Needle 32G X 4 MM MISC Inject 1 each into the skin 4 (four) times daily. as directed 09/01/20 10/31/20  Kc, Ramesh, MD  Insulin Syringes, Disposable, U-100 0.3 ML MISC 1 Syringe by Does not apply   route 3 (three) times daily. 09/01/20   [name], MD  Lancets (ONETOUCH ULTRASOFT) lancets Use as instructed Patient taking differently: 1 each by Other route as directed. 02/16/20   [name], FNP  nitroGLYCERIN (NITROSTAT) 0.4 MG SL tablet PLACE 1 TABLET (0.4 MG TOTAL) UNDER THE TONGUE EVERY FIVE MINUTES X 3 DOSES AS NEEDED FOR CHEST PAIN. Patient taking differently: Place 0.4 mg under the tongue every 5 (five) minutes x 3 doses as needed for chest pain. 03/27/20 03/27/21  [name], Ava, DO  omega-3 acid ethyl esters (LOVAZA) 1 g capsule Take 1 g by mouth 2 (two) times daily.    [provider]  oxybutynin (DITROPAN) 5 MG tablet TAKE 1 TABLET (5 MG TOTAL) BY MOUTH TWO TIMES DAILY. Patient taking differently: Take 5 mg by mouth 2 (two) times daily. 03/27/20 03/27/21  [name], MD  oxyCODONE 10 MG TABS Take 1 tablet (10 mg total) by mouth every 6 (six) hours as needed for severe pain (Severe pain.). Patient taking differently: Take 10 mg by mouth 5 (five) times daily. 05/03/20   Debbe Odea, MD  ticagrelor (BRILINTA) 90 MG TABS tablet Take 1 tablet (90 mg total) by mouth 2 (two) times daily. 09/01/20 10/01/20  [name], MD    Physical Exam:  Constitutional: Obese middle-age male who appears to be lethargic but able to follow commands Vitals:   09/29/20 0300 09/29/20 0511 09/29/20 0515 09/29/20 0800  BP: (!) 168/97 (!) 151/107 (!) 165/96 (!) 155/96  Pulse: 80 74 77 67  Resp: _0 Temp:      TempSrc:      SpO2: 97% 97% 97% 98%   Eyes: PERRL, lids  and conjunctivae normal ENMT: Mucous membranes are dry. Posterior pharynx clear of any exudate or lesions.  Neck: normal, supple, no masses, no thyromegaly Respiratory: clear to auscultation bilaterally, no wheezing, no crackles. Normal respiratory effort. No accessory muscle use.  Cardiovascular: Regular rate and rhythm, no murmurs / rubs / gallops.  2+ pitting edema of the left lower extremity.  2+ pedal pulses. No carotid bruits.  Abdomen: no tenderness, no masses palpated. No hepatosplenomegaly. Bowel sounds positive.  Musculoskeletal: no clubbing / cyanosis.  Right BKA Skin: Venous stasis ulcer present of the lateral aspect of the left leg without significant erythema appreciated. Neurologic: CN 2-12 grossly intact. Sensation intact, DTR normal. Strength 5/5 in all 4.  Psychiatric: Fair judgment and insight.  Lethargic, but oriented x 3. Normal mood.     Labs on Admission: I have personally reviewed following labs and imaging studies  CBC: Recent Labs  Lab 09/29/20 0009  WBC 7.0  HGB 11.9*  HCT 37.5*  MCV 83.5  PLT 540   Basic Metabolic Panel: Recent Labs  Lab 09/29/20 0009  NA 119*  K 4.5  CL 86*  CO2 22  GLUCOSE 855*  BUN 35*  CREATININE 2.44*  CALCIUM 9.0   GFR: CrCl cannot be calculated (Unknown ideal weight.). Liver Function Tests: Recent Labs  Lab 09/29/20 0009  AST 16  ALT 15  ALKPHOS 123  BILITOT 0.6  PROT 7.6  ALBUMIN 3.1*   No results for input(s): LIPASE, AMYLASE in the last 168 hours. No results for input(s): AMMONIA in the last 168 hours. Coagulation Profile: No results for input(s): INR, PROTIME in the last 168 hours. Cardiac Enzymes: No results for input(s): CKTOTAL, CKMB, CKMBINDEX, TROPONINI in the last 168 hours. BNP (last 3 results) No results for input(s): PROBNP in  the last 8760 hours. HbA1C: No results for input(s): HGBA1C in the last 72 hours. CBG: Recent Labs  Lab 09/29/20 0513 09/29/20 0552 09/29/20 0637 09/29/20 0722  09/29/20 0803  GLUCAP >600* >600* >600* 487* 520*   Lipid Profile: No results for input(s): CHOL, HDL, LDLCALC, TRIG, CHOLHDL, LDLDIRECT in the last 72 hours. Thyroid Function Tests: No results for input(s): TSH, T4TOTAL, FREET4, T3FREE, THYROIDAB in the last 72 hours. Anemia Panel: No results for input(s): VITAMINB12, FOLATE, FERRITIN, TIBC, IRON, RETICCTPCT in the last 72 hours. Urine analysis:    Component Value Date/Time   COLORURINE STRAW (A) 09/29/2020 0223   APPEARANCEUR HAZY (A) 09/29/2020 0223   LABSPEC 1.018 09/29/2020 0223   PHURINE 5.0 09/29/2020 0223   GLUCOSEU >=500 (A) 09/29/2020 0223   HGBUR SMALL (A) 09/29/2020 0223   BILIRUBINUR NEGATIVE 09/29/2020 0223   BILIRUBINUR negative 09/07/2018 1034   KETONESUR NEGATIVE 09/29/2020 0223   PROTEINUR 100 (A) 09/29/2020 0223   UROBILINOGEN 1.0 09/07/2018 1034   UROBILINOGEN 1.0 01/01/2017 1032   NITRITE NEGATIVE 09/29/2020 0223   LEUKOCYTESUR SMALL (A) 09/29/2020 0223   Sepsis Labs: No results found for this or any previous visit (from the past 240 hour(s)).   Radiological Exams on Admission: DG Chest 2 View  Result Date: 09/29/2020 CLINICAL DATA:  Chest pain EXAM: CHEST - 2 VIEW COMPARISON:  03/21/2020 FINDINGS: The heart size and mediastinal contours are within normal limits. Both lungs are clear. The visualized skeletal structures are unremarkable. IMPRESSION: No active cardiopulmonary disease. Electronically Signed   By: Fidela Salisbury MD   On: 09/29/2020 00:38    EKG: Independently reviewed.  Sinus rhythm at 77 bpm with first-degree heart block and QTC 452.    Assessment/Plan Diabetes mellitus type 2 uncontrolled with hyperglycemia: Acute.  On admission patient presents with glucose 855, sodium 119, CO2 22, and anion gap 11.  Patient does not appear to be in DKA and would suspect hyperglycemic hyperosmolar state. His last hemoglobin A1c on 08/29/2020 was 13.9.  Hospitalized last month for the same.  Patient was given  1 L normal saline IV fluids and started on insulin drip.  He reports that he was taking 20 units of NovoLog with meals.  However, it appears he should have been on metformin 1000 mg twice daily, NovoLog 10 units with meals, and Lantus 40 units nightly. -Admit to a progressive bed -Hyperglycemic crisis admission order set utilized -Add on serum osmolarity -Given 2 L of lactated ringer IV fluids -Check BMP every 4 hours x2 -Give 20 mEq of potassium chloride IV -Correct electrolyte abnormalities as needed  Atypical chest pain: Acute.  Patient presented with right-sided chest pain.  High-sensitivity troponins were negative x2.  EKG without significant ischemic changes. -Check D-dimer  Acute metabolic encephalopathy: Patient was noted to be lethargic but easily arousable on physical exam.  Suspect secondary to patient's hyperglycemia. -Neurochecks  Hyponatremia: Acute.  On admission sodium level 119, and even when corrected for patient's hyperglycemia was noted to be low at 131. -Continue IV fluids as seen above -Continue to monitor sodium level  Abnormal urinalysis: Urinalysis significant for glucose without ketones, small leukocytes, few bacteria, and 1-50 WBCs. -Check urine culture -Rocephin IV  Acute kidney injury superimposed on chronic kidney disease stage IIIb: On admission creatinine 2.44 which appears improved when last discharge from 5/27 where creatinine was 2.78.  Patient baseline previously have been around 1.7 in February of this year. -Continue to monitor kidney function  Essential hypertension: On admission blood  pressures elevated up to 168/97. -Continue Coreg and amlodipine -Held lisinopril due to kidney function  HIV/AIDS -Continue Biktarvy and dapsone -Continue outpatient follow-up with ID  Anemia chronic disease: On admission 11.9 g/dL with normal MCV and MCH.  This appears to be decreased from previous on 5/25 of 12.5 g/dL.  Suspect hemoglobin is possibly more given  patient's hyperglycemia. -Recheck H&H  CAD: Patient with previous history of stent to OM1 in 11/26/2018 and DES to [place] in 03/2020. -Continue Brilinta, aspirin, beta-blocker, and statin -Continue outpatient follow-up with cardiology  History of CVA: Patient reports having residual left lower extremity weakness. -Continue statin and antiplatelet  HIV/AIDS -Continue Biktarvy and dapsone  Chronic pain: Patient reportedly is taking oxycodone 10 mg 5 times daily. -Oxycodone 10 mg as needed for pain.  Not scheduled as he is lethargic currently.  Venous ulcer of the left leg: Chronic.  Patient is followed by [name] in the outpatient setting.  There is no significant erythema around the ulcer -Continue outpatient follow-up with [name]  Anxiety -Continue Lexapro and decreased Xanax to 0.5 mg as needed  S/p Right BKA  DVT prophylaxis: Heparin Code Status: Full Family Communication: No family requested to be updated at this time Disposition Plan: To be determined Consults called: Diabetic education Admission status: Observation  Norval Morton MD [facility]   If 7PM-7AM, please contact night-coverage   09/29/2020, 8:05 AM

## 2020-09-29 NOTE — Progress Notes (Addendum)
Orders placed to transition patient off of insulin drip.  Will start Lantus 20 units of 40 recommended nightly as patient has been n.p.o.  NovoLog 10 units 3 times daily with meals.  CBGs before every meal and at bedtime with sensitive SSI.  Consider likely resuming Lantus 40 units nightly if patient tolerating diet and has no lows over night.  Patient's D-dimer did however come back +1.06.  Checking stat VQ scan due to patient's kidney function and Doppler ultrasound of the left leg.  We will start patient on Lovenox per pharmacy for treatment of possible VTE in the interim.

## 2020-09-29 NOTE — Progress Notes (Signed)
Inpatient Diabetes Program Recommendations  AACE/ADA: New Consensus Statement on Inpatient Glycemic Control (2015)  Target Ranges:  Prepandial:   less than 140 mg/dL      Peak postprandial:   less than 180 mg/dL (1-2 hours)      Critically ill patients:  140 - 180 mg/dL   Lab Results  Component Value Date   GLUCAP 219 (H) 09/29/2020   HGBA1C 13.9 (H) 08/29/2020    Review of Glycemic Control Results for TUFF, MATURO (MRN [id number]) as of 09/29/2020 15:18  Ref. Range 09/29/2020 13:07 09/29/2020 14:05 09/29/2020 14:59  Glucose-Capillary Latest Ref Range: 70 - 99 mg/dL 147 (H) 175 (H) 219 (H)   Diabetes history:  DM2 Outpatient Diabetes medications:  Lantus 40 units QHS Novolog 10 units TID with meals Metformin-dose unknown Current orders for Inpatient glycemic control:  IV insulin  Inpatient Diabetes Program Recommendations:    Familiar to inpatient DM team. Have spoke with patient multiple times on taking insulins as prescribed. Previous visits were on 08/29/20 & 03/27/2020. [facility] pending.  Per DM coordinator note from 5/24- " He can afford his insulin-$0-$3.00 co-pay. "  Thanks, Bronson Curb, MSN, RNC-OB Diabetes Coordinator [phone] (8a-5p)

## 2020-09-29 NOTE — Progress Notes (Signed)
Pharmacy Consult - Lovenox  For rule out PE/DVT D-dimer 1.06  Plan: Lovenox 120 mg sq Q 12 hours Follow workup  Thank you Anette Guarneri, PharmD

## 2020-09-29 NOTE — ED Provider Notes (Addendum)
[facility] EMERGENCY DEPARTMENT Provider Note  CSN: [id number] Arrival date & time: 09/28/20 2330  Chief Complaint(s) Chest Pain  HPI Keith [name] is a 55 y.o. male with a past medical history listed below including type 2 diabetes on insulin who presents to the emergency department for elevated blood sugars for the past several days.  He reports that he is compliant with his insulin use.  Also reported right-sided/central chest pain has been ongoing constantly for 1 week described as sharp pain.  Worse with movement.  No shortness of breath.  No nausea vomiting.  No abdominal pain or diarrhea.   Chest Pain  Past Medical History Past Medical History:  Diagnosis Date   Abscess of right foot    abscess/ulcer of R transtibial amputation requiring IV abx   AIDS ([place])    Anemia    CAD (coronary artery disease)    a. MI with stenting of OM1 in 11/2018 with residual disease. b. acute STEMI 03/2020 s/p DES to OM1   Chronic anemia    Chronic knee pain    right   Chronic pain    CKD (chronic kidney disease), stage IV (HCC)    CVA (cerebral vascular accident) ([place]) 04/2020   Diabetes type 2, uncontrolled ([place])    HgA1c 17.6 (04/27/2010)   Diabetic foot ulcer ([place]) 01/2017   right foot   Dilatation of aorta (HCC)    Erectile dysfunction    Genital warts    HIV (human immunodeficiency virus infection) ([place]) 2009   CD4 count 100, VL 13800 (05/01/2010)   Hyperlipidemia    Hypertension    Noncompliance with medication regimen    Osteomyelitis ([place])    h/o hand   Osteomyelitis of metatarsal ([place]) 04/28/2017   Patient Active Problem List   Diagnosis Date Noted   Hyperglycemia due to diabetes mellitus ([place]) 08/29/2020   Hyperglycemia 08/29/2020   Acute cerebrovascular accident (CVA) due to ischemia ([place]) 04/28/2020   Type 2 diabetes mellitus with hyperlipidemia (HCC)    PAD (peripheral artery disease) ([place])    Hypoglycemia 04/25/2020   Acute ST elevation  myocardial infarction (STEMI) due to occlusion of circumflex coronary artery ([place]) 03/22/2020   Uncontrolled type 2 diabetes mellitus with hyperglycemia ([place]) 01/14/2019   Elevated glucose 01/14/2019   Hx of BKA, right ([place]) 01/14/2019   Pressure injury of skin 11/11/2018   STEMI (ST elevation myocardial infarction) ([place]) 11/10/2018   CKD (chronic kidney disease) stage 4, GFR 15-29 ml/min (HCC) 11/10/2018   Amputation stump infection ([place]) 10/28/2018   Type II diabetes mellitus with renal manifestations ([place]) 08/07/2018   Uncontrolled type 2 diabetes mellitus with hyperosmolar nonketotic hyperglycemia ([place]) 08/07/2018   Non-pressure chronic ulcer of right calf limited to breakdown of skin ([place]) 07/06/2018   Type 2 diabetes mellitus without complication, without long-term current use of insulin ([place]) 06/15/2018   Chronic low back pain without sciatica 06/15/2018   Idiopathic chronic venous hypertension of left lower extremity with ulcer and inflammation (HCC)    Venous stasis ulcer of left calf limited to breakdown of skin without varicose veins ([place]) 04/23/2018   Acquired absence of right leg below knee ([place]) 06/13/2017   Acute renal failure superimposed on stage 3 chronic kidney disease ([place]) 03/15/2017   Diabetic polyneuropathy associated with type 2 diabetes mellitus ([place]) 01/23/2017   AKI (acute kidney injury) ([place]) 09/28/2016   Nausea vomiting and diarrhea 09/28/2016   Onychomycosis of multiple toenails with type 2 diabetes mellitus ([place]) 08/29/2015  MRSA carrier 04/20/2014   Penile wart 02/22/2014   HIV disease ([place])    Insulin-requiring or dependent type II diabetes mellitus ([place]) 02/04/2014   Dental anomaly 11/20/2012   Arthritis of right knee 02/23/2012   Hyperlipidemia 11/10/2011   Chronic pain 08/07/2011   Meralgia paraesthetica 04/23/2011   ERECTILE DYSFUNCTION 08/22/2008   Essential hypertension 05/19/2008   Home Medication(s) Prior to Admission medications    Medication Sig Start Date End Date Taking? Authorizing Provider  acetaminophen (TYLENOL) 325 MG tablet Take 2 tablets (650 mg total) by mouth every 4 (four) hours as needed for mild pain, fever or headache. 05/03/20   Debbe Odea, MD  allopurinol (ZYLOPRIM) 300 MG tablet Take 300 mg by mouth daily. 06/09/20   [provider]  ALPRAZolam [name]) 1 MG tablet Take 1 mg by mouth daily as needed for anxiety. 08/15/20   [provider]  amLODipine (NORVASC) 10 MG tablet Take 1 tablet (10 mg total) by mouth daily. 09/01/20 10/31/20  [name], MD  aspirin 81 MG EC tablet Take 1 tablet (81 mg total) by mouth daily. 03/27/20   [name], MD  atorvastatin (LIPITOR) 80 MG tablet Take 1 tablet (80 mg total) by mouth daily. 09/01/20 10/31/20  [name], MD  bictegravir-emtricitabine-tenofovir AF (BIKTARVY) 50-200-25 MG TABS tablet Take 1 tablet by mouth daily. 05/19/20   [name], MD  blood glucose meter kit and supplies Dispense based on patient and insurance preference. Use up to four times daily as directed. (FOR ICD-10 E10.9, E11.9). Patient taking differently: 1 each by Other route See admin instructions. Dispense based on patient and insurance preference. Use up to four times daily as directed. (FOR ICD-10 E10.9, E11.9). 07/17/20   [name], FNP  carvedilol (COREG) 25 MG tablet Take 1 tablet (25 mg total) by mouth 2 (two) times daily with a meal. 09/01/20 10/31/20  [name], MD  dapsone 100 MG tablet TAKE 1 TABLET (100 MG TOTAL) BY MOUTH DAILY. Patient taking differently: Take 100 mg by mouth daily. 03/27/20 03/27/21  [name], Ava, DO  EASY COMFORT PEN NEEDLES 31G X 5 MM MISC INJECT 20 UNITS DAILY AS DIRECTED. Patient taking differently: Inject 20 Units into the skin daily. 11/10/19   [name], FNP  escitalopram (LEXAPRO) 20 MG tablet Take 20 mg by mouth daily. 08/15/20   [provider]  gabapentin (NEURONTIN) 100 MG capsule Take 1 capsule (100 mg  total) by mouth 3 (three) times daily. 09/01/20 10/31/20  [name], MD  glucose blood (COOL BLOOD GLUCOSE TEST STRIPS) test strip Use as instructed Patient taking differently: 1 each by Other route as directed. 02/16/20   [name], FNP  icosapent Ethyl (VASCEPA) 1 g capsule TAKE 2 CAPSULES (2 G TOTAL) BY MOUTH TWO TIMES DAILY. Patient taking differently: Take 2 g by mouth 2 (two) times daily. 03/27/20 03/27/21  [name], MD  insulin aspart (NOVOLOG) 100 UNIT/ML FlexPen Inject 10 Units into the skin 3 (three) times daily with meals. 09/01/20 10/31/20  [name], MD  insulin glargine (LANTUS) 100 UNIT/ML injection Inject 0.4 mLs (40 Units total) into the skin at bedtime. 09/01/20 10/31/20  [name], MD  Insulin Pen Needle 32G X 4 MM MISC Inject 1 each into the skin 4 (four) times daily. as directed 09/01/20 10/31/20  [name], MD  Insulin Syringes, Disposable, U-100 0.3 ML MISC 1 Syringe by Does not apply route 3 (three) times daily. 09/01/20   [name],  MD  Lancets (ONETOUCH ULTRASOFT) lancets Use as instructed Patient taking differently: 1 each by Other route as directed. 02/16/20   [name], FNP  nitroGLYCERIN (NITROSTAT) 0.4 MG SL tablet PLACE 1 TABLET (0.4 MG TOTAL) UNDER THE TONGUE EVERY FIVE MINUTES X 3 DOSES AS NEEDED FOR CHEST PAIN. Patient taking differently: Place 0.4 mg under the tongue every 5 (five) minutes x 3 doses as needed for chest pain. 03/27/20 03/27/21  [name], Ava, DO  omega-3 acid ethyl esters (LOVAZA) 1 g capsule Take 1 g by mouth 2 (two) times daily.    [provider]  oxybutynin (DITROPAN) 5 MG tablet TAKE 1 TABLET (5 MG TOTAL) BY MOUTH TWO TIMES DAILY. Patient taking differently: Take 5 mg by mouth 2 (two) times daily. 03/27/20 03/27/21  [name], MD  oxyCODONE 10 MG TABS Take 1 tablet (10 mg total) by mouth every 6 (six) hours as needed for severe pain (Severe pain.). Patient taking differently: Take 10 mg by mouth 5 (five)  times daily. 05/03/20   Debbe Odea, MD  ticagrelor (BRILINTA) 90 MG TABS tablet Take 1 tablet (90 mg total) by mouth 2 (two) times daily. 09/01/20 10/01/20  [name], MD                                                                                                                                    Past Surgical History Past Surgical History:  Procedure Laterality Date   AMPUTATION Right 05/02/2017   Procedure: AMPUTATION TRANSMETARSAL;  Surgeon: [name], MD;  Location: [place];  Service: Orthopedics;  Laterality: Right;   AMPUTATION Right 06/13/2017   Procedure: RIGHT BELOW KNEE AMPUTATION;  Surgeon: [name], MD;  Location: [place];  Service: Orthopedics;  Laterality: Right;   BELOW KNEE LEG AMPUTATION Right 06/13/2017   BELOW KNEE LEG AMPUTATION     CORONARY STENT INTERVENTION N/A 11/10/2018   Procedure: CORONARY STENT INTERVENTION;  Surgeon: Leonie Man, MD;  Location: [place] [facility];  Service: Cardiovascular;  Laterality: N/A;   CORONARY/GRAFT ACUTE MI REVASCULARIZATION N/A 03/21/2020   Procedure: Coronary/Graft Acute MI Revascularization;  Surgeon: [name], MD;  Location: [place] [facility];  Service: Cardiovascular;  Laterality: N/A;   HERNIA REPAIR     I & D EXTREMITY Left 08/21/2014   Procedure: INCISION AND DRAINAGE LEFT SMALL FINGER;  Surgeon: Leanora Cover, MD;  Location: [place];  Service: Orthopedics;  Laterality: Left;   I & D EXTREMITY Right 03/18/2017   Procedure: IRRIGATION AND DEBRIDEMENT EXTREMITY;  Surgeon: [name], MD;  Location: [place];  Service: Orthopedics;  Laterality: Right;   LEFT HEART CATH AND CORONARY ANGIOGRAPHY N/A 11/10/2018   Procedure: LEFT HEART CATH AND CORONARY ANGIOGRAPHY;  Surgeon: Leonie Man, MD;  Location: [place] [facility];  Service: Cardiovascular;  Laterality: N/A;   LEFT HEART CATH AND CORONARY ANGIOGRAPHY N/A 03/21/2020  Procedure: LEFT HEART CATH AND CORONARY ANGIOGRAPHY;  Surgeon: [name], MD;   Location: [place] [facility];  Service: Cardiovascular;  Laterality: N/A;   MINOR IRRIGATION AND DEBRIDEMENT OF WOUND Right 04/22/2014   Procedure: IRRIGATION AND DEBRIDEMENT OF RIGHT NECK ABCESS;  Surgeon: Jerrell Belfast, MD;  Location: [place];  Service: ENT;  Laterality: Right;   MULTIPLE EXTRACTIONS WITH ALVEOLOPLASTY N/A 01/18/2013   Procedure: MULTIPLE EXTRACION 3, 6, 7, 10, 11, 13, 21, 22, 27, 28, 29, 30 WITH ALVEOLOPLASTY;  Surgeon: Gae Bon, DDS;  Location: [place];  Service: Oral Surgery;  Laterality: N/A;   SKIN SPLIT GRAFT Right 03/21/2017   Procedure: IRRIGATION AND DEBRIDEMENT RIGHT FOOT AND APPLY SPLIT THICKNESS SKIN GRAFT AND WOUND VAC;  Surgeon: [name], MD;  Location: [place];  Service: Orthopedics;  Laterality: Right;   TEE WITHOUT CARDIOVERSION N/A 05/02/2017   Procedure: TRANSESOPHAGEAL ECHOCARDIOGRAM (TEE);  Surgeon: Acie Fredrickson, Wonda Cheng, MD;  [facility];  Service: Cardiovascular;  Laterality: N/A;  coincidental to orthopedic case   Family History Family History  Problem Relation Age of Onset   Hypertension Mother    Arthritis Father    Hypertension Father    Hypertension Brother    Cancer Maternal Grandmother 91       unknown type of cancer   Depression Paternal Grandmother     Social History Social History   Tobacco Use   Smoking status: Never   Smokeless tobacco: Never  Vaping Use   Vaping Use: Never used  Substance Use Topics   Alcohol use: Not Currently   Drug use: Not Currently    Comment: OTC ASA   Allergies Elemental sulfur and Sulfa antibiotics  Review of Systems Review of Systems  Cardiovascular:  Positive for chest pain.  All other systems are reviewed and are negative for acute change except as noted in the HPI  Physical Exam Vital Signs  I have reviewed the triage vital signs BP (!) 168/97   Pulse 80   Temp 98.4 F (36.9 C) (Oral)   Resp 14   SpO2 97%   Physical Exam Vitals reviewed.  Constitutional:      General: He is  not in acute distress.    Appearance: He is well-developed. He is not diaphoretic.  HENT:     Head: Normocephalic and atraumatic.     Nose: Nose normal.  Eyes:     General: No scleral icterus.       Right eye: No discharge.        Left eye: No discharge.     Conjunctiva/sclera: Conjunctivae normal.     Pupils: Pupils are equal, round, and reactive to light.  Cardiovascular:     Rate and Rhythm: Normal rate and regular rhythm.     Heart sounds: No murmur heard.   No friction rub. No gallop.  Pulmonary:     Effort: Pulmonary effort is normal. No respiratory distress.     Breath sounds: Normal breath sounds. No stridor. No rales.  Abdominal:     General: There is no distension.     Palpations: Abdomen is soft.     Tenderness: There is no abdominal tenderness.  Musculoskeletal:        General: No tenderness.     Cervical back: Normal range of motion and neck supple.       Legs:  Skin:    General: Skin is warm and dry.     Findings: No erythema or rash.  Neurological:     Mental Status: He is alert and oriented to person, place, and time.    ED Results and Treatments Labs (all labs ordered are listed, but only abnormal results are displayed) Labs Reviewed  COMPREHENSIVE METABOLIC PANEL - Abnormal; Notable for the following components:      Result Value   Sodium 119 (*)    Chloride 86 (*)    Glucose, Bld 855 (*)    BUN 35 (*)    Creatinine, Ser 2.44 (*)    Albumin 3.1 (*)    GFR, Estimated 30 (*)    All other components within normal limits  CBC - Abnormal; Notable for the following components:   Hemoglobin 11.9 (*)    HCT 37.5 (*)    All other components within normal limits  URINALYSIS, ROUTINE W REFLEX MICROSCOPIC - Abnormal; Notable for the following components:   Color, Urine STRAW (*)    APPearance HAZY (*)    Glucose, UA >=500 (*)    Hgb urine dipstick SMALL (*)    Protein, ur 100 (*)    Leukocytes,Ua SMALL (*)    Bacteria, UA FEW (*)    All other  components within normal limits  CBG MONITORING, ED - Abnormal; Notable for the following components:   Glucose-Capillary >600 (*)    All other components within normal limits  CBG MONITORING, ED - Abnormal; Notable for the following components:   Glucose-Capillary >600 (*)    All other components within normal limits  CBG MONITORING, ED - Abnormal; Notable for the following components:   Glucose-Capillary >600 (*)    All other components within normal limits  TROPONIN I (HIGH SENSITIVITY)  TROPONIN I (HIGH SENSITIVITY)                                                                                                                         EKG   EKG Interpretation  Date/Time:  Friday September 29 2020 06:08:39 EDT Ventricular Rate:  77 PR Interval:  204 QRS Duration: 122 QT Interval:  399 QTC Calculation: 452 R Axis:   -37 Text Interpretation: Sinus rhythm Nonspecific IVCD with LAD Left ventricular hypertrophy Inferior infarct, old Anterior Q waves, possibly due to LVH Lateral leads are also involved No significant change since last tracing Confirmed by Addison Lank [phone]) on 09/29/2020 6:12:24 AM        Radiology DG Chest 2 View  Result Date: 09/29/2020 CLINICAL DATA:  Chest pain EXAM: CHEST - 2 VIEW COMPARISON:  03/21/2020 FINDINGS: The heart size and mediastinal contours are within normal limits. Both lungs are clear. The visualized skeletal structures are unremarkable. IMPRESSION: No active cardiopulmonary disease. Electronically Signed   By: Fidela Salisbury MD   On: 09/29/2020 00:38    Pertinent labs & imaging results that were available during my care of the patient were reviewed by me and considered in my medical decision making (see chart for details).  Medications Ordered in ED Medications  insulin  regular, human (MYXREDLIN) 100 units/ 100 mL infusion (5 Units/hr Intravenous New Bag/Given 09/29/20 0520)  dextrose 5 % in lactated ringers infusion (has no administration in time  range)  dextrose 50 % solution 0-50 mL (has no administration in time range)  0.9 %  sodium chloride infusion (has no administration in time range)  sodium chloride 0.9 % bolus 1,000 mL (1,000 mLs Intravenous New Bag/Given 09/29/20 0512)                                                                                                                                    Procedures .1-3 Lead EKG Interpretation  Date/Time: 09/29/2020 5:58 AM Performed by: Fatima Blank, MD Authorized by: Fatima Blank, MD     Interpretation: normal     ECG rate:  84   ECG rate assessment: normal     Rhythm: sinus rhythm     Ectopy: none     Conduction: normal   .Critical Care  Date/Time: 10/11/2020 3:05 AM Performed by: Fatima Blank, MD Authorized by: Fatima Blank, MD   Critical care provider statement:    Critical care time (minutes):  45   Critical care was necessary to treat or prevent imminent or life-threatening deterioration of the following conditions:  Metabolic crisis and endocrine crisis   Critical care was time spent personally by me on the following activities:  Discussions with consultants, evaluation of patient's response to treatment, examination of patient, ordering and performing treatments and interventions, ordering and review of laboratory studies, ordering and review of radiographic studies, pulse oximetry, re-evaluation of patient's condition, obtaining history from patient or surrogate and review of old charts  (including critical care time)  Medical Decision Making / ED Course I have reviewed the nursing notes for this encounter and the patient's prior records (if available in EHR or on provided paperwork).   [name] was evaluated in Emergency Department on 09/29/2020 for the symptoms described in the history of present illness. He was evaluated in the context of the global COVID-19 pandemic, which necessitated consideration that the patient  might be at risk for infection with the SARS-CoV-2 virus that causes COVID-19. Institutional protocols and algorithms that pertain to the evaluation of patients at risk for COVID-19 are in a state of rapid change based on information released by regulatory bodies including the CDC and federal and state organizations. These policies and algorithms were followed during the patient's care in the ED.  Work-up notable for significant hyperglycemia greater than 800.  No evidence of diabetic ketoacidosis or HHS as.  No infectious symptoms or source.  Question whether patient is actually taking his medication.  Started on IV fluids and insulin drip.  Admitted to medicine for further work-up and management.  Chest pain appears to be highly atypical for ACS.  EKG without acute ischemic changes or evidence of pericarditis.  Troponin negative. Presentation not classic for aortic  dissection or esophageal perforation.  Doubt PE. Chest x-ray without evidence suggestive of pneumonia, pneumothorax, pneumomediastinum.  No abnormal contour of the mediastinum to suggest dissection. No evidence of acute injuries.       Final Clinical Impression(s) / ED Diagnoses Final diagnoses:  Hyperglycemia      This chart was dictated using voice recognition software.  Despite best efforts to proofread,  errors can occur which can change the documentation meaning.    Fatima Blank, MD 09/29/20 [id number]    Fatima Blank, MD 10/11/20 [phone]

## 2020-09-29 NOTE — Evaluation (Signed)
Physical Therapy Evaluation Patient Details Name: Keith [name] MRN: [name] DOB: 23-Dec-1965 Today's Date: 09/29/2020   History of Present Illness  pt is a 55 y/o male presenting to the ED on 6/23 for elevated blood sugars and reports of R sided/central chest pain. EKG without acute ischemic changes or evidence of pericarditis.  Troponin negative. Presentation not classic for aortic dissection or esophageal perforation. Chest x-ray without evidence suggestive of pneumonia, pneumothorax, pneumomediastinum. CXR indicates no active cardiopulmonary disease PMH including HIV, DM2, CKD 4, CAD s/p stenting, DM2, and right BKA & chronic anemia.  Clinical Impression  Pt demonstrates generalized weakness and deficits in endurance, activity tolerance, balance, safety awareness, and gait. Pt tolerates ambulation of limited household distances with RW and without physical assistance, requiring one seated rest break. Pt reports his prosthetic fits more loosely, as his residual limb has decreased in size and was planning on getting a new prosthetic PTA. Pt expresses long term goal to ambulate without walker and voices concerns over his L LE wounds. Pt will benefit from acute PT to maximize independence in mobility and improve activity tolerance. SPT recommends outpatient PT to challenge pt with dynamic gait activities and improve balance and to provide further assistance with prosthetic management.     Follow Up Recommendations Outpatient PT    Equipment Recommendations  Rolling walker with 5" wheels    Recommendations for Other Services       Precautions / Restrictions Precautions Precautions: Fall Precaution Comments: R BKA Restrictions Weight Bearing Restrictions: No      Mobility  Bed Mobility Overal bed mobility: Modified Independent             General bed mobility comments: HOB elevated    Transfers Overall transfer level: Needs assistance Equipment used: Rolling walker (2  wheeled) Transfers: Sit to/from Stand Sit to Stand: Min guard;From elevated surface         General transfer comment: cues for hand placement  Ambulation/Gait Ambulation/Gait assistance: Min guard Gait Distance (Feet): 40 Feet (x 2) Assistive device: Rolling walker (2 wheeled) Gait Pattern/deviations: Step-through pattern;Trunk flexed;Wide base of support;Decreased stride length Gait velocity: reduced Gait velocity interpretation: >2.62 ft/sec, indicative of community ambulatory General Gait Details: Pt ambulates 40 feet with one seated rest break and use of rollign walker and additional 40 feet. Pt reports gait to be fairly similar to baseline  Science writer    Modified Rankin (Stroke Patients Only)       Balance Overall balance assessment: Needs assistance Sitting-balance support: Feet supported Sitting balance-Leahy Scale: Good Sitting balance - Comments: Pt able to don prosthetic   Standing balance support: During functional activity Standing balance-Leahy Scale: Fair Standing balance comment: pt picks up walker off floor to reposition                             Pertinent Vitals/Pain Pain Assessment: 0-10 Pain Score: 7  Pain Location: R residual limb Pain Descriptors / Indicators: Discomfort Pain Intervention(s): Monitored during session    Home Living Family/patient expects to be discharged to:: Private residence Living Arrangements: Parent Available Help at Discharge: Friend(s);Available PRN/intermittently Type of Home: House Home Access: Stairs to enter Entrance Stairs-Rails: Psychiatric nurse of Steps: 3 Home Layout: One level Home Equipment: Walker - 2 wheels;Shower seat Additional Comments: walker is broken    Prior Function Level of Independence: Independent with assistive device(s)  Comments: uses prosthetic and mobilizes with walker.     Hand Dominance         Extremity/Trunk Assessment   Upper Extremity Assessment Upper Extremity Assessment: Overall WFL for tasks assessed    Lower Extremity Assessment Lower Extremity Assessment: Generalized weakness       Communication   Communication: No difficulties  Cognition Arousal/Alertness: Awake/alert Behavior During Therapy: WFL for tasks assessed/performed Overall Cognitive Status: Within Functional Limits for tasks assessed                                        General Comments General comments (skin integrity, edema, etc.): Pt walker noticeably broken.    Exercises     Assessment/Plan    PT Assessment Patient needs continued PT services  PT Problem List Decreased strength;Decreased activity tolerance;Decreased balance;Decreased knowledge of use of DME;Decreased safety awareness;Decreased knowledge of precautions;Pain       PT Treatment Interventions DME instruction;Gait training;Stair training;Functional mobility training;Therapeutic activities;Therapeutic exercise;Balance training;Patient/family education    PT Goals (Current goals can be found in the Care Plan section)  Acute Rehab PT Goals Patient Stated Goal: be able to walk without walker PT Goal Formulation: With patient Time For Goal Achievement: 10/13/20 Potential to Achieve Goals: Good    Frequency Min 3X/week   Barriers to discharge        Co-evaluation               AM-PAC PT "6 Clicks" Mobility  Outcome Measure Help needed turning from your back to your side while in a flat bed without using bedrails?: None Help needed moving from lying on your back to sitting on the side of a flat bed without using bedrails?: None Help needed moving to and from a bed to a chair (including a wheelchair)?: A Little Help needed standing up from a chair using your arms (e.g., wheelchair or bedside chair)?: A Little Help needed to walk in hospital room?: A Little Help needed climbing 3-5 steps with a  railing? : A Little 6 Click Score: 20    End of Session Equipment Utilized During Treatment: Gait belt Activity Tolerance: Patient limited by fatigue Patient left: in bed;with call bell/phone within reach (EOB) Nurse Communication: Mobility status PT Visit Diagnosis: Unsteadiness on feet (R26.81);Muscle weakness (generalized) (M62.81);Other abnormalities of gait and mobility (R26.89)    Time: [id number] PT Time Calculation (min) (ACUTE ONLY): 42 min   Charges:   PT Evaluation $PT Eval Low Complexity: 1 Low PT Treatments $Gait Training: 8-22 mins        Acute Rehab  Pager: [phone]   Garwin Brothers, SPT  09/29/2020, 5:39 PM

## 2020-09-29 NOTE — Progress Notes (Signed)
Informed MD Tamala Julian that patient's CBS was 5 and confirmed that MD wanted the patient to receive IVF D5LR at 144m/hr.

## 2020-09-29 NOTE — ED Notes (Signed)
Lab called and d-dimer clotted. Notified phleb for redraw

## 2020-09-29 NOTE — Progress Notes (Signed)
Patient is a new admission from ED to room 2W37.Patient A&O*4. Able to verbalize his needs to staff. No CO pain or discomfort at this time. Patient has insulin gtt at 6.5units/hr, potassium and LR on gravity running through his PIVs. Patient connected to the cardiac monitor VS checked. Patient got a bath on admission. Patient has prosthetic leg to his right leg from knee below. Left leg skin very dry swollen and flaky, scab present to the left lateral shin. Bleeding from the scab site noted and site cleaned and dressing applied. Patient's CBG due to be checked at 12 noon.  Bed kept in low position and locked. Call bell in reach.

## 2020-09-29 NOTE — TOC Initial Note (Addendum)
Transition of Care [facility]) - Initial/Assessment Note    Patient Details  Name: Keith [name] MRN: [name] Date of Birth: [birth year]  Transition of [facility]) CM/SW Contact:    Verdell Carmine, RN Phone Number: 09/29/2020, 3:18 PM  Clinical Narrative:                 Patient admitted with hyperglycemia, states he has been compliant with his medications. Also having some substernal chest pain.  Patient has a right BKA for foot abbess previously. Has DN II.  Patient is requesting to go to SNF. PT consult for recommendations.  CM will follow Expected Discharge Plan: Home/Self Care Barriers to Discharge: ED PCP establishment   Patient Goals and CMS Choice        Expected Discharge Plan and Services Expected Discharge Plan: Home/Self Care       Living arrangements for the past 2 months: Single Family Home                                      Prior Living Arrangements/Services Living arrangements for the past 2 months: Single Family Home   Patient language and need for interpreter reviewed:: Yes        Need for Family Participation in Patient Care: Yes (Comment) Care giver support system in place?: Yes (comment)   Criminal Activity/Legal Involvement Pertinent to Current Situation/Hospitalization: No - Comment as needed  Activities of Daily Living      Permission Sought/Granted                  Emotional Assessment       Orientation: : Oriented to Situation, Oriented to  Time, Oriented to Place, Oriented to Self   Psych Involvement: No (comment)  Admission diagnosis:  Hyperglycemia [R73.9] Patient Active Problem List   Diagnosis Date Noted   Hyperglycemia due to diabetes mellitus ([place]) 08/29/2020   Hyperglycemia 08/29/2020   Acute cerebrovascular accident (CVA) due to ischemia ([place]) 04/28/2020   Type 2 diabetes mellitus with hyperlipidemia (HCC)    PAD (peripheral artery disease) ([place])    Hypoglycemia 04/25/2020   Acute ST elevation  myocardial infarction (STEMI) due to occlusion of circumflex coronary artery ([place]) 03/22/2020   Uncontrolled type 2 diabetes mellitus with hyperglycemia ([place]) 01/14/2019   Elevated glucose 01/14/2019   Hx of BKA, right ([place]) 01/14/2019   Pressure injury of skin 11/11/2018   STEMI (ST elevation myocardial infarction) ([place]) 11/10/2018   CKD (chronic kidney disease) stage 4, GFR 15-29 ml/min (HCC) 11/10/2018   Amputation stump infection ([place]) 10/28/2018   Type II diabetes mellitus with renal manifestations ([place]) 08/07/2018   Uncontrolled type 2 diabetes mellitus with hyperosmolar nonketotic hyperglycemia ([place]) 08/07/2018   Non-pressure chronic ulcer of right calf limited to breakdown of skin ([place]) 07/06/2018   Type 2 diabetes mellitus without complication, without long-term current use of insulin ([place]) 06/15/2018   Chronic low back pain without sciatica 06/15/2018   Idiopathic chronic venous hypertension of left lower extremity with ulcer and inflammation (HCC)    Venous stasis ulcer of left calf limited to breakdown of skin without varicose veins ([place]) 04/23/2018   Acquired absence of right leg below knee ([place]) 06/13/2017   Acute renal failure superimposed on stage 3 chronic kidney disease ([place]) 03/15/2017   Diabetic polyneuropathy associated with type 2 diabetes mellitus ([place]) 01/23/2017   AKI (acute kidney injury) ([place]) 09/28/2016   Nausea  vomiting and diarrhea 09/28/2016   Onychomycosis of multiple toenails with type 2 diabetes mellitus ([place]) 08/29/2015   MRSA carrier 04/20/2014   Penile wart 02/22/2014   HIV disease ([place])    Insulin-requiring or dependent type II diabetes mellitus ([place]) 02/04/2014   Dental anomaly 11/20/2012   Arthritis of right knee 02/23/2012   Hyperlipidemia 11/10/2011   Chronic pain 08/07/2011   Meralgia paraesthetica 04/23/2011   ERECTILE DYSFUNCTION 08/22/2008   Essential hypertension 05/19/2008   PCP:  Pcp, No Pharmacy:   [id number] [place],  [address]Phone: [phone]: [phone]    Social Determinants of Health (SDOH) Interventions    Readmission Risk Interventions Readmission Risk Prevention Plan 11/13/2018 11/13/2018 11/03/2018  Transportation Screening - Complete Complete  PCP or Specialist Appt within 3-5 Days - - Complete  [place]- - [place]  Social Work Consult for [place]/Counseling - - Complete  Palliative Care Screening - - Not Applicable  Medication Review (Press photographer - Complete Complete  PCP or Specialist appointment within 3-5 days of discharge Complete - -  [place] Home Care Consult Complete - -  SW Recovery Care/Counseling Consult Complete - -  Palliative Care Screening Not Applicable - -  [place] Applic[place] - -  Some encounter information is confidential and restricted. Go to Review Flowsheets activity to see all data.  Some recent data might be hidden

## 2020-09-29 NOTE — Plan of Care (Signed)
  Problem: Education: Goal: Knowledge of General Education information will improve Description Including pain rating scale, medication(s)/side effects and non-pharmacologic comfort measures Outcome: Progressing   Problem: Health Behavior/Discharge Planning: Goal: Ability to manage health-related needs will improve Outcome: Progressing   

## 2020-09-30 ENCOUNTER — Observation Stay (HOSPITAL_BASED_OUTPATIENT_CLINIC_OR_DEPARTMENT_OTHER): Payer: Medicare HMO

## 2020-09-30 DIAGNOSIS — E1100 Type 2 diabetes mellitus with hyperosmolarity without nonketotic hyperglycemic-hyperosmolar coma (NKHHC): Principal | ICD-10-CM

## 2020-09-30 DIAGNOSIS — R609 Edema, unspecified: Secondary | ICD-10-CM | POA: Diagnosis not present

## 2020-09-30 LAB — CBC WITH DIFFERENTIAL/PLATELET
Abs Immature Granulocytes: 0.04 10*3/uL (ref 0.00–0.07)
Basophils Absolute: 0 10*3/uL (ref 0.0–0.1)
Basophils Relative: 1 %
Eosinophils Absolute: 0.1 10*3/uL (ref 0.0–0.5)
Eosinophils Relative: 1 %
HCT: 35.4 % — ABNORMAL LOW (ref 39.0–52.0)
Hemoglobin: 12.1 g/dL — ABNORMAL LOW (ref 13.0–17.0)
Immature Granulocytes: 1 %
Lymphocytes Relative: 21 %
Lymphs Abs: 1.7 10*3/uL (ref 0.7–4.0)
MCH: 27.6 pg (ref 26.0–34.0)
MCHC: 34.2 g/dL (ref 30.0–36.0)
MCV: 80.8 fL (ref 80.0–100.0)
Monocytes Absolute: 0.5 10*3/uL (ref 0.1–1.0)
Monocytes Relative: 6 %
Neutro Abs: 5.6 10*3/uL (ref 1.7–7.7)
Neutrophils Relative %: 70 %
Platelets: 241 10*3/uL (ref 150–400)
RBC: 4.38 MIL/uL (ref 4.22–5.81)
RDW: 12.7 % (ref 11.5–15.5)
WBC: 8 10*3/uL (ref 4.0–10.5)
nRBC: 0 % (ref 0.0–0.2)

## 2020-09-30 LAB — BASIC METABOLIC PANEL
Anion gap: 9 (ref 5–15)
BUN: 22 mg/dL — ABNORMAL HIGH (ref 6–20)
CO2: 22 mmol/L (ref 22–32)
Calcium: 9.2 mg/dL (ref 8.9–10.3)
Chloride: 103 mmol/L (ref 98–111)
Creatinine, Ser: 1.85 mg/dL — ABNORMAL HIGH (ref 0.61–1.24)
GFR, Estimated: 42 mL/min — ABNORMAL LOW (ref >60)
Glucose, Bld: 257 mg/dL — ABNORMAL HIGH (ref 70–99)
Potassium: 4 mmol/L (ref 3.5–5.1)
Sodium: 134 mmol/L — ABNORMAL LOW (ref 135–145)

## 2020-09-30 LAB — GLUCOSE, CAPILLARY
Glucose-Capillary: 274 mg/dL — ABNORMAL HIGH (ref 70–99)
Glucose-Capillary: 253 mg/dL — ABNORMAL HIGH (ref 70–99)
Glucose-Capillary: 288 mg/dL — ABNORMAL HIGH (ref 70–99)
Glucose-Capillary: 288 mg/dL — ABNORMAL HIGH (ref 70–99)

## 2020-09-30 LAB — URINE CULTURE

## 2020-09-30 IMAGING — DX DG CHEST 2V
2 series · 2 of 2 positions shown · non-contrast
Comparison: 08/23/2019

CLINICAL DATA: Cough for 2 weeks

EXAM:
CHEST - 2 VIEW

[chest pa]
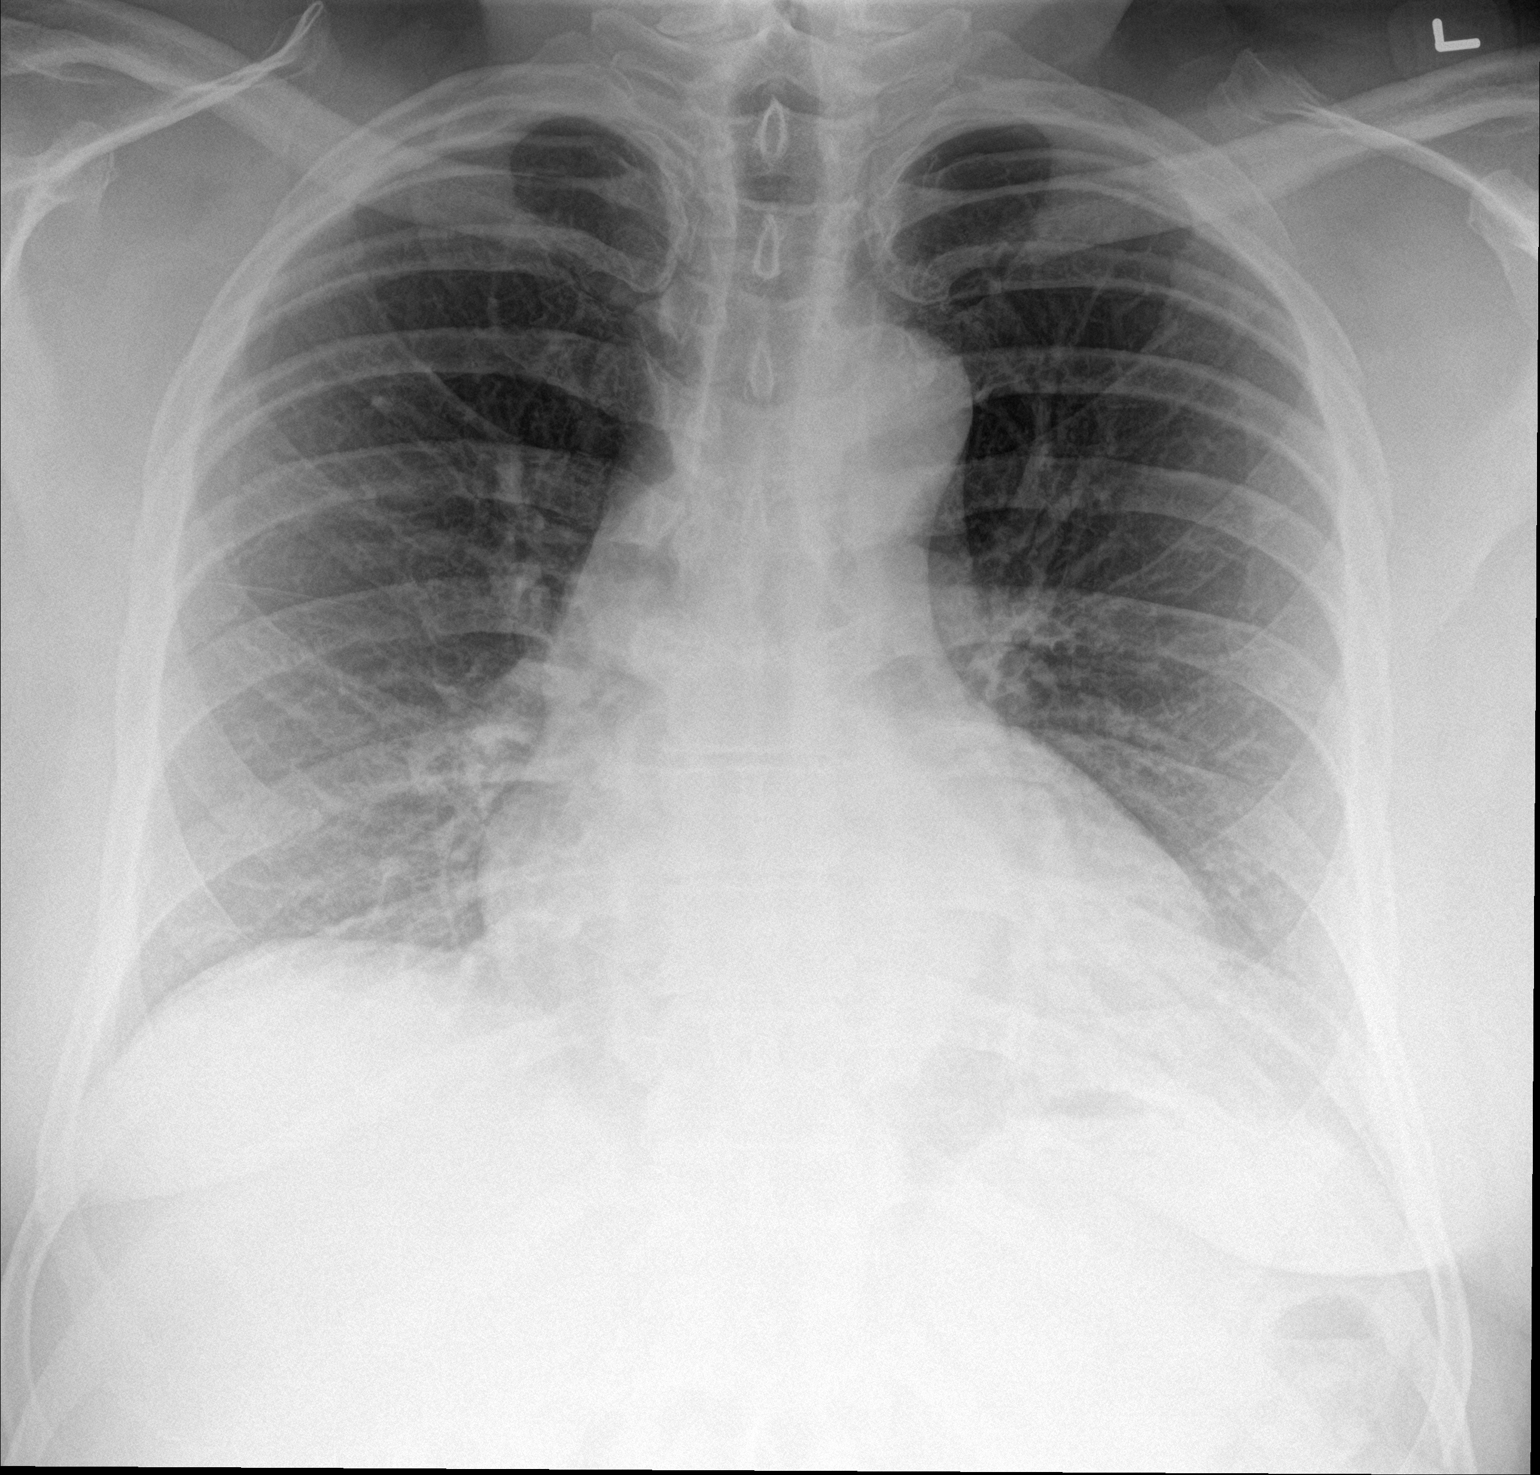

[chest lat]
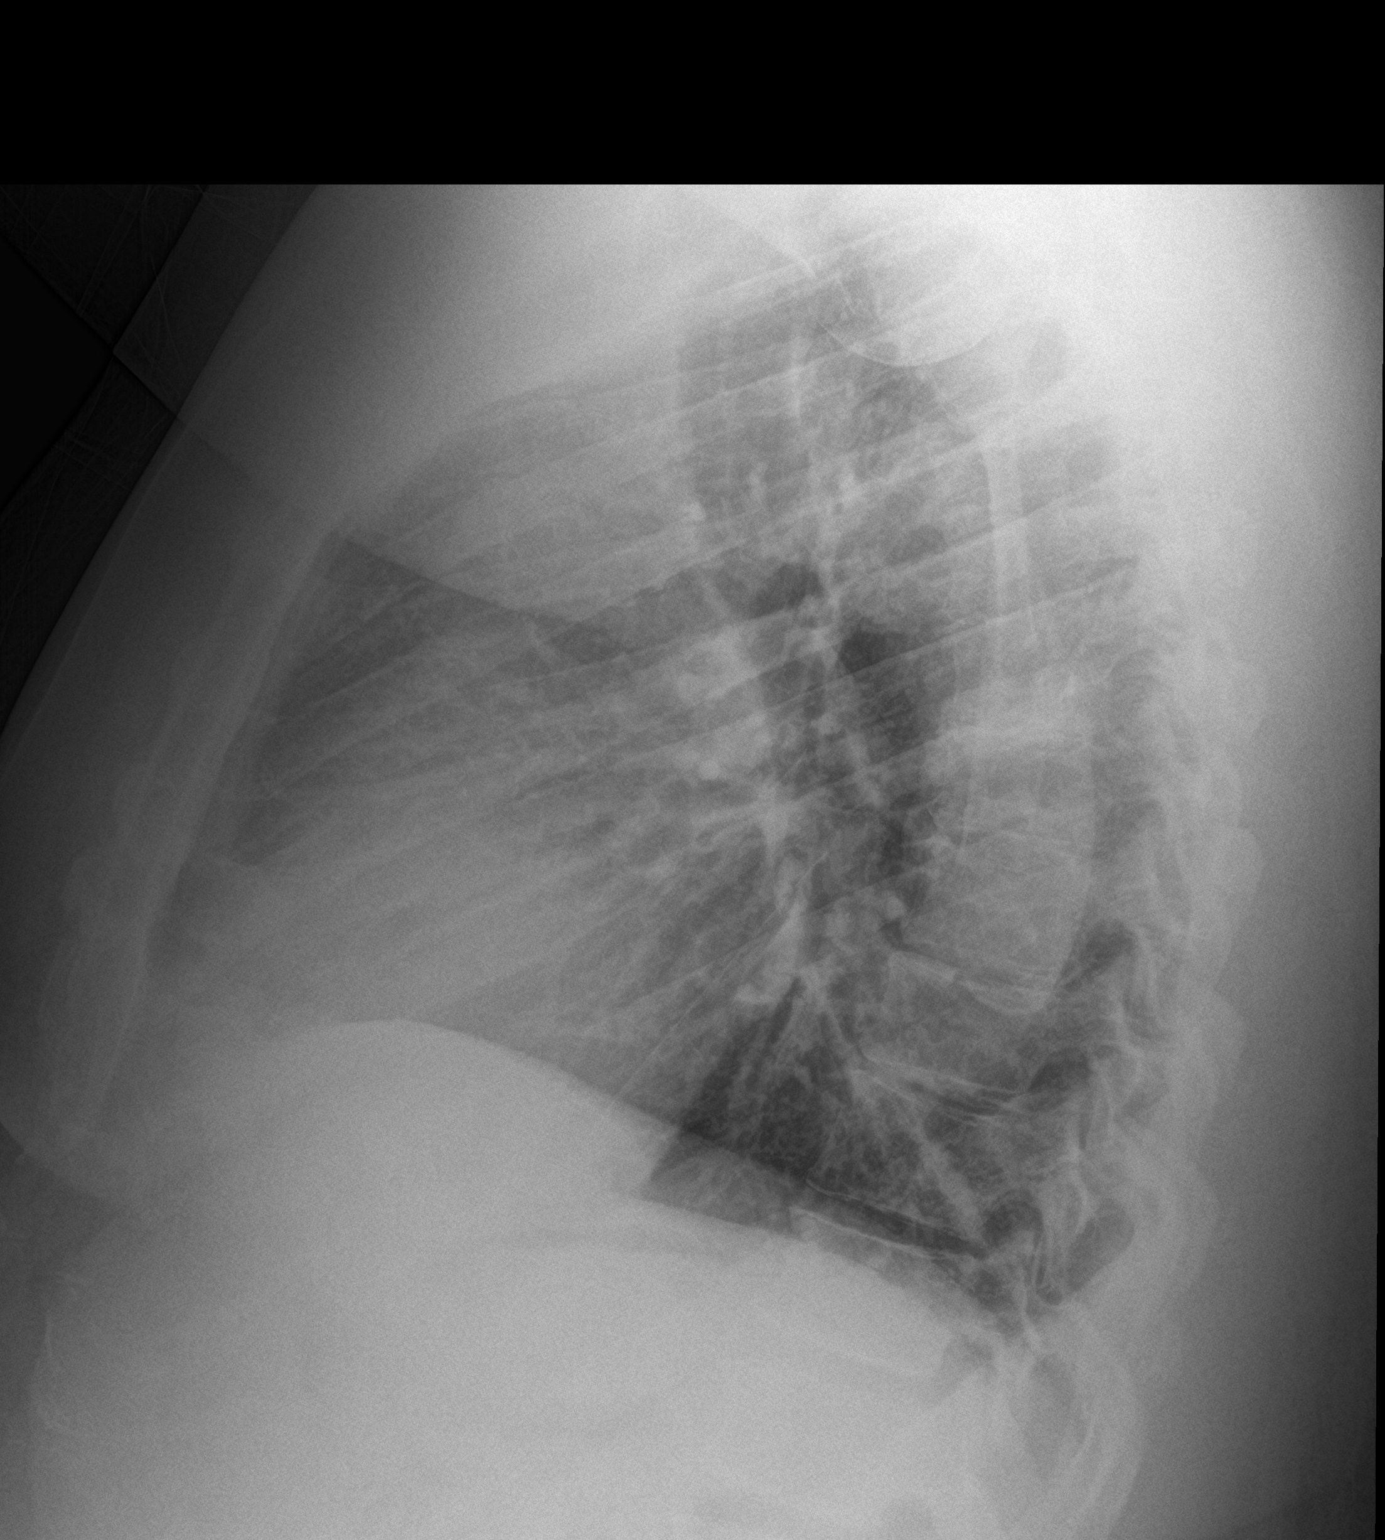

[2 of 2 positions shown; findings below may reference images not displayed]

FINDINGS: Enlargement of cardiac silhouette.

Mediastinal contours and pulmonary vascularity normal.

Minimal subsegmental atelectasis at RIGHT base.

Lungs otherwise clear.

No infiltrate, pleural effusion or pneumothorax.

Osseous structures unremarkable.
IMPRESSION: Minimal subsegmental atelectasis RIGHT base.

Enlargement of cardiac silhouette.

## 2020-09-30 MED ORDER — ENOXAPARIN SODIUM 60 MG/0.6ML IJ SOSY: 60.0000 mg | PREFILLED_SYRINGE | INTRAMUSCULAR | Status: DC

## 2020-09-30 MED ORDER — INSULIN ASPART PROT & ASPART (70-30 MIX) 100 UNIT/ML ~~LOC~~ SUSP
30.0000 [IU] | Freq: Two times a day (BID) | SUBCUTANEOUS | Status: DC
  Administered 2020-09-30 – 2020-10-01 (×3): 30 [IU] via SUBCUTANEOUS
  Filled 2020-09-30: qty 10

## 2020-09-30 MED ORDER — OXYCODONE HCL 5 MG PO TABS
10.0000 mg | ORAL_TABLET | Freq: Four times a day (QID) | ORAL | Status: DC | PRN
  Administered 2020-09-30: 10 mg via ORAL
  Filled 2020-09-30: qty 2

## 2020-09-30 MED ORDER — INSULIN ASPART 100 UNIT/ML IJ SOLN
0.0000 [IU] | Freq: Three times a day (TID) | INTRAMUSCULAR | Status: DC
  Administered 2020-09-30: 8 [IU] via SUBCUTANEOUS
  Administered 2020-10-01: 3 [IU] via SUBCUTANEOUS
  Administered 2020-10-01: 15 [IU] via SUBCUTANEOUS
  Administered 2020-10-01: 8 [IU] via SUBCUTANEOUS

## 2020-09-30 NOTE — Progress Notes (Signed)
Pt has been offered medications on two separate occasions this morning. Both times pt has said he needed to eat breakfast first , then he told me to "come back later" Pt advised on importance of medication. Will continue to monitor .

## 2020-09-30 NOTE — Progress Notes (Signed)
PROGRESS NOTE    [name]  [id number] DOB: 27-Jul-1965 DOA: 09/28/2020 PCP: Merryl Hacker, No  [id number]   Assessment & Plan:   Principal Problem:   Uncontrolled type 2 diabetes mellitus with hyperosmolar nonketotic hyperglycemia (HCC) Active Problems:   Essential hypertension   Hyponatremia   Acute kidney injury superimposed on CKD (HCC)   Venous stasis ulcer of left calf limited to breakdown of skin without varicose veins (HCC)   Hx of BKA, right (HCC)   Atypical chest pain   Acute metabolic encephalopathy   Keith [name] is a 55 y.o. male with medical history significant of HTN, HLD, CAD s/p stenting, DM type II uncontrolled, AIDS, CKD, CVA, chronic pain, chronic ulcer of the left leg, and s/p right BKA presents with complaints of chest pain and elevated blood sugar over the last 1-2 weeks.  History is somewhat limited as the patient is lethargic.  He has been having right-sided chest pain that he reports is severe and has become constant over the last week.  Notes associated symptoms of shortness of breath unable to walk more than 5-10 feet without needing to rest.  At baseline he uses a walker to ambulate.  He states that he has been given himself 20 units of insulin with meals, but states that he is not on any long-acting insulin.  Associated symptoms include lower extremity swelling, urinary frequency, nausea, intermittent vomiting, lightheadedness, and weakness.  Denies any loss of consciousness, falls, fever, dysuria, or diarrhea.  He had just recently been hospitalized 5/24-5/27 with hyperglycemia and suspected AKI on CKD versus progression of CKD.  He was discharged to a SNF it seems and patient reports that he was not there long enough.  He had followed up with orthopedics on 6/7 and was told that the wound was healing.  He was advised to continue the double extra-large compression sock and to wear it 24 hours only taking off to shower and dry in place.  Patient states he still  been having clear drainage, but denies any significant redness or erythema on the wound.     Diabetes mellitus type 2 uncontrolled with hyperglycemia Insulin non-compliance --On admission patient presents with glucose 855, sodium 119, CO2 22, and anion gap 11.  Patient does not appear to be in DKA and would suspect hyperglycemic hyperosmolar state. His last hemoglobin A1c on 08/29/2020 was 13.9.  Hospitalized last month for the same.  Patient was given 1 L normal saline IV fluids and started on insulin drip.  He reports that he was taking 20 units of NovoLog with meals.  However, it appears he should have been on metformin 1000 mg twice daily, NovoLog 10 units with meals, and Lantus 40 units nightly. Plan: --switch to 70/30 30u BID for simpler regimen.  Atypical chest pain, ACS ruled out --trop neg x2   Acute metabolic encephalopathy Patient was noted to be lethargic but easily arousable on physical exam.  Suspect secondary to patient's hyperglycemia. -Neurochecks   PseudoHyponatremia --monitor   UTI, ruled out --d/c ceftriaxone   Acute kidney injury, ruled out    Essential hypertension:  BP varied widely --cont coreg and amlodipine --Hold Lisinopril   HIV/AIDS -Continue Biktarvy and dapsone -Continue outpatient follow-up with ID   Anemia chronic disease: On admission 11.9 g/dL with normal MCV and MCH.  This appears to be decreased from previous on 5/25 of 12.5 g/dL.  Suspect hemoglobin is possibly more given patient's hyperglycemia.   CAD: Patient with previous history of stent  to [place] in 11/26/2018 and DES to [place] in 03/2020. -on home Brilinta, aspirin, beta-blocker, and statin --cont above home regimen   History of CVA:  Patient reports having residual left lower extremity weakness. -Continue statin and antiplatelet   Chronic pain on chronic opioids --cont home oxycodone 10 mg PRN   Venous ulcer of the left leg:  Chronic.  Patient is followed by [name] in the outpatient  setting.  There is no significant erythema around the ulcer -Continue outpatient follow-up with [name]   Anxiety -Continue Lexapro  --cont Xanax at reduced 0.5 mg PRN   S/p Right BKA   DVT prophylaxis: Lovenox SQ Code Status: Full code  Family Communication:  Level of care: Med-Surg Dispo:   The patient is from: home Anticipated d/c is to: home Anticipated d/c date is: tomorrow Patient currently is not medically ready to d/c due to: 1 day to figure out his insulin needs and regimen   Subjective and Interval History:  Pt was off insulin gtt.  No N/V abdominal pain.  Not communicative, and very unmotivated.  Said that he wanted to go to a SNF so he can be taken care of.     Objective: Vitals:   09/29/20 2009 09/30/20 0423 09/30/20 0737 09/30/20 1143  BP: 137/86 (!) 141/80 140/82 (!) 107/46  Pulse: 81 80 71 76  Resp: '19 18 13 16  '$ Temp: 98 F (36.7 C) 98.7 F (37.1 C) 98.5 F (36.9 C) 98 F (36.7 C)  TempSrc: Oral Oral Oral Oral  SpO2: 99% 99% 100% 99%  Weight:      Height:        Intake/Output Summary (Last 24 hours) at 09/30/2020 1929 Last data filed at 09/30/2020 0200 Gross per 24 hour  Intake --  Output 500 ml  Net -500 ml   Filed Weights   09/29/20 0800  Weight: 122.5 kg    Examination:   Constitutional: NAD, AAOx3 HEENT: conjunctivae and lids normal, EOMI CV: No cyanosis.   RESP: normal respiratory effort, on RA Extremities: right BKA, LLE wrapped with gauce SKIN: warm, dry Neuro: II - XII grossly intact.   Psych: depressed mood and affect.     Data Reviewed: I have personally reviewed following labs and imaging studies  CBC: Recent Labs  Lab 09/29/20 0009 09/29/20 0845 09/30/20 0208  WBC 7.0  --  8.0  NEUTROABS  --   --  5.6  HGB 11.9* 11.8* 12.1*  HCT 37.5* 36.3* 35.4*  MCV 83.5  --  80.8  PLT 263  --  [id number]   Basic Metabolic Panel: Recent Labs  Lab 09/29/20 0009 09/29/20 0845 09/29/20 1144 09/30/20 0208  NA 119* 125* 132* 134*   K 4.5 3.8 4.0 4.0  CL 86* 95* 100 103  CO2 '22 23 25 22  '$ GLUCOSE 855* 500* 193* 257*  BUN 35* 31* 26* 22*  CREATININE 2.44* 2.16* 2.01* 1.85*  CALCIUM 9.0 8.9 9.2 9.2   GFR: Estimated Creatinine Clearance: 58.3 mL/min (A) (by C-G formula based on SCr of 1.85 mg/dL (H)). Liver Function Tests: Recent Labs  Lab 09/29/20 0009  AST 16  ALT 15  ALKPHOS 123  BILITOT 0.6  PROT 7.6  ALBUMIN 3.1*   No results for input(s): LIPASE, AMYLASE in the last 168 hours. No results for input(s): AMMONIA in the last 168 hours. Coagulation Profile: No results for input(s): INR, PROTIME in the last 168 hours. Cardiac Enzymes: No results for input(s): CKTOTAL, CKMB, CKMBINDEX, TROPONINI in  the last 168 hours. BNP (last 3 results) No results for input(s): PROBNP in the last 8760 hours. HbA1C: No results for input(s): HGBA1C in the last 72 hours. CBG: Recent Labs  Lab 09/29/20 2038 09/29/20 2211 09/30/20 0706 09/30/20 1144 09/30/20 1601  GLUCAP 262* 203* 274* 253* 288*   Lipid Profile: No results for input(s): CHOL, HDL, LDLCALC, TRIG, CHOLHDL, LDLDIRECT in the last 72 hours. Thyroid Function Tests: No results for input(s): TSH, T4TOTAL, FREET4, T3FREE, THYROIDAB in the last 72 hours. Anemia Panel: No results for input(s): VITAMINB12, FOLATE, FERRITIN, TIBC, IRON, RETICCTPCT in the last 72 hours. Sepsis Labs: No results for input(s): PROCALCITON, LATICACIDVEN in the last 168 hours.  Recent Results (from the past 240 hour(s))  SARS CORONAVIRUS 2 (TAT 6-24 HRS) Nasopharyngeal Nasopharyngeal Swab     Status: None   Collection Time: 09/29/20  8:47 AM   Specimen: Nasopharyngeal Swab  Result Value Ref Range Status   SARS Coronavirus 2 NEGATIVE NEGATIVE Final    Comment: (NOTE) SARS-CoV-2 target nucleic acids are NOT DETECTED.  The SARS-CoV-2 RNA is generally detectable in upper and lower respiratory specimens during the acute phase of infection. Negative results do not preclude  SARS-CoV-2 infection, do not rule out co-infections with other pathogens, and should not be used as the sole basis for treatment or other patient management decisions. Negative results must be combined with clinical observations, patient history, and epidemiological information. The expected result is Negative.  Fact Sheet for Patients: SugarRoll.be  Fact Sheet for Healthcare Providers: https://www.woods-mathews.com/  This test is not yet approved or cleared by the [place] FDA and  has been authorized for detection and/or diagnosis of SARS-CoV-2 by FDA under an Emergency Use Authorization (EUA). This EUA will remain  in effect (meaning this test can be used) for the duration of the COVID-19 declaration under Se ction 564(b)(1) of the Act, 21 U.S.C. section 360bbb-3(b)(1), unless the authorization is terminated or revoked sooner.  Performed at [facility], [place] [address] [place], [place] [address]   Culture, Urine     Status: Abnormal   Collection Time: 09/29/20  9:17 AM   Specimen: Urine, Random  Result Value Ref Range Status   Specimen Description URINE, RANDOM  Final   Special Requests   Final    NONE Performed at [facility], [place] [address], [place], [place] [address]    Culture MULTIPLE SPECIES PRESENT, SUGGEST RECOLLECTION (A)  Final   Report Status 09/30/2020 FINAL  Final  MRSA Next Gen by PCR, Nasal     Status: None   Collection Time: 09/29/20  5:29 PM   Specimen: Nasal Mucosa; Nasal Swab  Result Value Ref Range Status   MRSA by PCR Next Gen NOT DETECTED NOT DETECTED Final    Comment: (NOTE) The GeneXpert MRSA Assay (FDA approved for NASAL specimens only), is one component of a comprehensive MRSA colonization surveillance program. It is not intended to diagnose MRSA infection nor to guide or monitor treatment for MRSA infections. Test performance is not FDA approved in patients less than 71  years old. Performed at [facility], [place] [address] [place], [place] [address]       Radiology Studies: DG Chest 2 View  Result Date: 09/29/2020 CLINICAL DATA:  Chest pain EXAM: CHEST - 2 VIEW COMPARISON:  03/21/2020 FINDINGS: The heart size and mediastinal contours are within normal limits. Both lungs are clear. The visualized skeletal structures are unremarkable. IMPRESSION: No active cardiopulmonary disease. Electronically  Signed   By: Fidela Salisbury MD   On: 09/29/2020 00:38   VAS [place] LOWER EXTREMITY VENOUS (DVT)  Result Date: 09/30/2020  Lower Venous DVT Study Patient Name:  Keith [name]  Date of Exam:   09/30/2020 Medical Rec #: [id number]       Accession #:    [id number] Date of Birth: July 14, 1965       Patient Gender: M Patient Age:   54Y Exam [facility]  [facility] Procedure:      VAS [place] LOWER EXTREMITY VENOUS (DVT) Referring Phys: [id number] [place] --------------------------------------------------------------------------------  Indications: Edema.  Limitations: Body habitus, poor ultrasound/tissue interface, bandages and patient position, patient unable to fully cooperate. Comparison Study: No prior study on file Performing Technologist: Maudry Mayhew MHA, RDMS, RVT, RDCS  Examination Guidelines: A complete evaluation includes B-mode imaging, spectral Doppler, color Doppler, and power Doppler as needed of all accessible portions of each vessel. Bilateral testing is considered an integral part of a complete examination. Limited examinations for reoccurring indications may be performed as noted. The reflux portion of the exam is performed with the patient in reverse Trendelenburg.  Right Technical Findings: Not visualized segments include CFV.  +---------+---------------+---------+-----------+----------+--------------+ LEFT     CompressibilityPhasicitySpontaneityPropertiesThrombus Aging +---------+---------------+---------+-----------+----------+--------------+  CFV      Full           Yes      Yes        patent                   +---------+---------------+---------+-----------+----------+--------------+ SFJ      Full                               patent                   +---------+---------------+---------+-----------+----------+--------------+ FV Prox  Full                               patent                   +---------+---------------+---------+-----------+----------+--------------+ FV Mid   Full                               patent                   +---------+---------------+---------+-----------+----------+--------------+ FV DistalFull                               patent                   +---------+---------------+---------+-----------+----------+--------------+ PFV      Full                               patent                   +---------+---------------+---------+-----------+----------+--------------+ POP      Full           Yes      Yes        patent                   +---------+---------------+---------+-----------+----------+--------------+ PTV  Yes        patent                   +---------+---------------+---------+-----------+----------+--------------+   Left Technical Findings: Not visualized segments include peroneal veins. Limited visualization of PTV.   Summary: LEFT: - There is no evidence of deep vein thrombosis in the lower extremity. However, portions of this examination were limited- see technologist comments above.  - No cystic structure found in the popliteal fossa.  *See table(s) above for measurements and observations. Electronically signed by Servando Snare MD on 09/30/2020 at 1:36:09 PM.    Final      Scheduled Meds:  amLODipine  10 mg Oral Daily   aspirin EC  81 mg Oral Daily   atorvastatin  80 mg Oral Daily   bictegravir-emtricitabine-tenofovir AF  1 tablet Oral Daily   carvedilol  25 mg Oral BID WC   [START ON 10/01/2020] enoxaparin (LOVENOX)  injection  60 mg Subcutaneous Q24H   escitalopram  20 mg Oral Daily   gabapentin  100 mg Oral TID   icosapent Ethyl  2 g Oral BID   insulin aspart  0-15 Units Subcutaneous TID WC   insulin aspart protamine- aspart  30 Units Subcutaneous BID WC   oxybutynin  5 mg Oral BID   ticagrelor  90 mg Oral BID   traZODone  100 mg Oral QHS   Continuous Infusions:   LOS: 0 days     Enzo Bi, MD [facility] If 7PM-7AM, please contact night-coverage 09/30/2020, 7:29 PM

## 2020-09-30 NOTE — Progress Notes (Signed)
Left lower extremity venous duplex completed. Refer to "CV Proc" under chart review to view preliminary results.  09/30/2020 11:15 AM Kelby Aline., MHA, RVT, RDCS, RDMS

## 2020-09-30 NOTE — TOC Initial Note (Addendum)
Transition of Care [facility]) - Initial/Assessment Note    Patient Details  Name: Keith [name] MRN: [id number] Date of Birth: [birth year]  [facility] CM/SW Contact:    Carles Collet, RN Phone Number: 09/30/2020, 1:55 PM  Clinical Narrative:       Spoke  to patient at bedside. He states that he lives at home w his mother. He states that he wants long term care in a SNF. We discussed his functional ability with PT eval, and that he would not qualify for that at this time. We discussed [place] services including PT, RN to assist w dressing changes to his leg, and social worker to help assist with application and placement to SNF for long term care placement as needed through PCP. Patient states that he has his car here, and will drive himself home. He has prosthetic here. Will order him a bariatric walker to take home.  It will be delivered to his room by [facility] prior to DC.      [place] set up with [facility]   (wellcare referred to [facility])   Expected Discharge Plan: [place] Barriers to Discharge: Continued Medical Work up   Patient Goals and CMS Choice Patient states their goals for this hospitalization and ongoing recovery are:: to go to long term SNF CMS Medicare.gov Compare Post Acute Care list provided to:: Patient Choice offered to / list presented to : Patient  Expected Discharge Plan and Services Expected Discharge Plan: [place]   Discharge Planning Services: CM Consult Post Acute Care Choice: Home Health, Durable Medical Equipment Living arrangements for the past 2 months: Single Family Home                 DME Arranged: Walker rolling DME Agency:  [facility] Aver) Date DME Agency Contacted: 09/30/20 Time DME Agency Contacted: 72 Representative spoke with at DME Agency: Alecia Lemming HH Arranged: PT, RN, Social Work [place] Date [place]: 09/30/20 Time [place]: 81 Representative spoke with at [place]:  [place] Arrangements/Services Living arrangements for the past 2 months: [place] Lives with:: Parents Patient language and need for interpreter reviewed:: Yes Do you feel safe going back to the place where you live?: Yes      Need for Family Participation in Patient Care: Yes (Comment) Care giver support system in place?: Yes (comment)   Criminal Activity/Legal Involvement Pertinent to Current Situation/Hospitalization: No - Comment as needed  Activities of Daily Living      Permission Sought/Granted                  Emotional Assessment       Orientation: : Oriented to Situation, Oriented to  Time, Oriented to Place, Oriented to Self   Psych Involvement: No (comment)  Admission diagnosis:  Hyperglycemia [R73.9] Patient Active Problem List   Diagnosis Date Noted   Atypical chest pain 123XX123   Acute metabolic encephalopathy 123XX123   Hyperglycemia due to diabetes mellitus ([place]) 08/29/2020   Hyperglycemia 08/29/2020   Acute cerebrovascular accident (CVA) due to ischemia ([place]) 04/28/2020   Type 2 diabetes mellitus with hyperlipidemia ([place])    PAD (peripheral artery disease) ([place])    Hypoglycemia 04/25/2020   Acute ST elevation myocardial infarction (STEMI) due to occlusion of circumflex coronary artery ([place]) 03/22/2020   Uncontrolled type 2 diabetes mellitus with hyperglycemia ([place]) 01/14/2019   Elevated glucose 01/14/2019   Hx of BKA, right ([place])  01/14/2019   Pressure injury of skin 11/11/2018   STEMI (ST elevation myocardial infarction) ([place]) 11/10/2018   CKD (chronic kidney disease) stage 4, GFR 15-29 ml/min (HCC) 11/10/2018   Amputation stump infection ([place]) 10/28/2018   Type II diabetes mellitus with renal manifestations ([place]) 08/07/2018   Uncontrolled type 2 diabetes mellitus with hyperosmolar nonketotic hyperglycemia ([place]) 08/07/2018   Non-pressure chronic ulcer of right calf limited to breakdown of skin ([place]) 07/06/2018   Type 2  diabetes mellitus without complication, without long-term current use of insulin ([place]) 06/15/2018   Chronic low back pain without sciatica 06/15/2018   Idiopathic chronic venous hypertension of left lower extremity with ulcer and inflammation (HCC)    Venous stasis ulcer of left calf limited to breakdown of skin without varicose veins ([place]) 04/23/2018   Acquired absence of right leg below knee ([place]) 06/13/2017   Acute kidney injury superimposed on CKD ([place]) 03/15/2017   Diabetic polyneuropathy associated with type 2 diabetes mellitus ([place]) 01/23/2017   AKI (acute kidney injury) ([place]) 09/28/2016   Nausea vomiting and diarrhea 09/28/2016   Onychomycosis of multiple toenails with type 2 diabetes mellitus ([place]) 08/29/2015   MRSA carrier 04/20/2014   Penile wart 02/22/2014   HIV disease ([place])    Insulin-requiring or dependent type II diabetes mellitus ([place]) 02/04/2014   Dental anomaly 11/20/2012   Arthritis of right knee 02/23/2012   Hyperlipidemia 11/10/2011   Hyponatremia 11/10/2011   Chronic pain 08/07/2011   Meralgia paraesthetica 04/23/2011   ERECTILE DYSFUNCTION 08/22/2008   Essential hypertension 05/19/2008   PCP:  Pcp, No Pharmacy:   [id number] [place] [place]Phone: [phone]: [phone]    Social Determinants of Health (SDOH) Interventions    Readmission Risk Interventions Readmission Risk Prevention Plan 11/13/2018 11/13/2018 11/03/2018  Transportation Screening - Complete Complete  PCP or Specialist Appt within 3-5 Days - - Complete  HRI or [place]- - Complete  Social Work Consult for [place]anning/Counseling - - Complete  Palliative Care Screening - - Not Applicable  Medication Review (Press photographer - Complete Complete  PCP or Specialist appointment within 3-5 days of discharge Complete - -  [place] Home Care Consult Complete - -   SW Recovery Care/Counseling Consult Complete - -  Palliative Care Screening Not Applicable - -  [place] Applicable - -  Some encounter information is confidential and restricted. Go to Review Flowsheets activity to see all data.  Some recent data might be hidden

## 2020-09-30 NOTE — Care Management Obs Status (Signed)
[place] NOTIFICATION   Patient Details  Name: DEANO PARELLO MRN: [id number] Date of Birth: 01-16-1966   Medicare Observation Status Notification Given:  Yes    Carles Collet, RN 09/30/2020, 2:01 PM

## 2020-10-01 DIAGNOSIS — G9341 Metabolic encephalopathy: Secondary | ICD-10-CM | POA: Diagnosis not present

## 2020-10-01 DIAGNOSIS — E871 Hypo-osmolality and hyponatremia: Secondary | ICD-10-CM | POA: Diagnosis present

## 2020-10-01 DIAGNOSIS — I83022 Varicose veins of left lower extremity with ulcer of calf: Secondary | ICD-10-CM | POA: Diagnosis not present

## 2020-10-01 DIAGNOSIS — B20 Human immunodeficiency virus [HIV] disease: Secondary | ICD-10-CM | POA: Diagnosis present

## 2020-10-01 DIAGNOSIS — Z89511 Acquired absence of right leg below knee: Secondary | ICD-10-CM | POA: Diagnosis not present

## 2020-10-01 DIAGNOSIS — Z794 Long term (current) use of insulin: Secondary | ICD-10-CM | POA: Diagnosis not present

## 2020-10-01 DIAGNOSIS — R0789 Other chest pain: Secondary | ICD-10-CM | POA: Diagnosis not present

## 2020-10-01 DIAGNOSIS — N184 Chronic kidney disease, stage 4 (severe): Secondary | ICD-10-CM | POA: Diagnosis not present

## 2020-10-01 DIAGNOSIS — I872 Venous insufficiency (chronic) (peripheral): Secondary | ICD-10-CM | POA: Diagnosis present

## 2020-10-01 DIAGNOSIS — Z8673 Personal history of transient ischemic attack (TIA), and cerebral infarction without residual deficits: Secondary | ICD-10-CM | POA: Diagnosis not present

## 2020-10-01 DIAGNOSIS — E1122 Type 2 diabetes mellitus with diabetic chronic kidney disease: Secondary | ICD-10-CM | POA: Diagnosis not present

## 2020-10-01 DIAGNOSIS — Z20822 Contact with and (suspected) exposure to covid-19: Secondary | ICD-10-CM | POA: Diagnosis not present

## 2020-10-01 DIAGNOSIS — R69 Illness, unspecified: Secondary | ICD-10-CM | POA: Diagnosis not present

## 2020-10-01 DIAGNOSIS — L97221 Non-pressure chronic ulcer of left calf limited to breakdown of skin: Secondary | ICD-10-CM | POA: Diagnosis not present

## 2020-10-01 DIAGNOSIS — E1100 Type 2 diabetes mellitus with hyperosmolarity without nonketotic hyperglycemic-hyperosmolar coma (NKHHC): Secondary | ICD-10-CM | POA: Diagnosis not present

## 2020-10-01 DIAGNOSIS — I252 Old myocardial infarction: Secondary | ICD-10-CM | POA: Diagnosis not present

## 2020-10-01 DIAGNOSIS — I129 Hypertensive chronic kidney disease with stage 1 through stage 4 chronic kidney disease, or unspecified chronic kidney disease: Secondary | ICD-10-CM | POA: Diagnosis present

## 2020-10-01 DIAGNOSIS — Z7984 Long term (current) use of oral hypoglycemic drugs: Secondary | ICD-10-CM | POA: Diagnosis not present

## 2020-10-01 DIAGNOSIS — Z955 Presence of coronary angioplasty implant and graft: Secondary | ICD-10-CM | POA: Diagnosis not present

## 2020-10-01 DIAGNOSIS — I2510 Atherosclerotic heart disease of native coronary artery without angina pectoris: Secondary | ICD-10-CM | POA: Diagnosis present

## 2020-10-01 DIAGNOSIS — G8929 Other chronic pain: Secondary | ICD-10-CM | POA: Diagnosis present

## 2020-10-01 DIAGNOSIS — Z9114 Patient's other noncompliance with medication regimen: Secondary | ICD-10-CM | POA: Diagnosis not present

## 2020-10-01 DIAGNOSIS — F419 Anxiety disorder, unspecified: Secondary | ICD-10-CM | POA: Diagnosis present

## 2020-10-01 DIAGNOSIS — Z882 Allergy status to sulfonamides status: Secondary | ICD-10-CM | POA: Diagnosis not present

## 2020-10-01 DIAGNOSIS — D631 Anemia in chronic kidney disease: Secondary | ICD-10-CM | POA: Diagnosis not present

## 2020-10-01 DIAGNOSIS — E785 Hyperlipidemia, unspecified: Secondary | ICD-10-CM | POA: Diagnosis present

## 2020-10-01 LAB — CBC
HCT: 33.3 % — ABNORMAL LOW (ref 39.0–52.0)
Hemoglobin: 11.1 g/dL — ABNORMAL LOW (ref 13.0–17.0)
MCH: 27.4 pg (ref 26.0–34.0)
MCHC: 33.3 g/dL (ref 30.0–36.0)
MCV: 82.2 fL (ref 80.0–100.0)
Platelets: 235 10*3/uL (ref 150–400)
RBC: 4.05 MIL/uL — ABNORMAL LOW (ref 4.22–5.81)
RDW: 12.8 % (ref 11.5–15.5)
WBC: 6.5 10*3/uL (ref 4.0–10.5)
nRBC: 0 % (ref 0.0–0.2)

## 2020-10-01 LAB — MAGNESIUM: Magnesium: 1.9 mg/dL (ref 1.7–2.4)

## 2020-10-01 LAB — BASIC METABOLIC PANEL
Anion gap: 8 (ref 5–15)
BUN: 23 mg/dL — ABNORMAL HIGH (ref 6–20)
CO2: 22 mmol/L (ref 22–32)
Calcium: 8.8 mg/dL — ABNORMAL LOW (ref 8.9–10.3)
Chloride: 102 mmol/L (ref 98–111)
Creatinine, Ser: 2.26 mg/dL — ABNORMAL HIGH (ref 0.61–1.24)
GFR, Estimated: 33 mL/min — ABNORMAL LOW (ref >60)
Glucose, Bld: 261 mg/dL — ABNORMAL HIGH (ref 70–99)
Potassium: 3.8 mmol/L (ref 3.5–5.1)
Sodium: 132 mmol/L — ABNORMAL LOW (ref 135–145)

## 2020-10-01 LAB — GLUCOSE, CAPILLARY
Glucose-Capillary: 259 mg/dL — ABNORMAL HIGH (ref 70–99)
Glucose-Capillary: 357 mg/dL — ABNORMAL HIGH (ref 70–99)
Glucose-Capillary: 199 mg/dL — ABNORMAL HIGH (ref 70–99)

## 2020-10-01 MED ORDER — LISINOPRIL 5 MG PO TABS: ORAL_TABLET | ORAL | Status: DC

## 2020-10-01 MED ORDER — INSULIN ASPART PROT & ASPART (70-30 MIX) 100 UNIT/ML ~~LOC~~ SUSP: 30.0000 [IU] | Freq: Two times a day (BID) | SUBCUTANEOUS | 2 refills | Status: DC

## 2020-10-01 MED ORDER — INSULIN ASPART PROT & ASPART (70-30 MIX) 100 UNIT/ML ~~LOC~~ SUSP: 30.0000 [IU] | Freq: Two times a day (BID) | SUBCUTANEOUS | 0 refills | Status: DC

## 2020-10-01 NOTE — Care Management (Signed)
Spoke w patient at bedside to discuss concerns about DC. He was concerned that his meds were sent to a pharmacy that was closed. We reviewed they were not. He was concerned that his blood sugars were over 200, we discussed how he would monitor his blood sugar after DC and that he will have a [facility] RN and PT to follow his blood sugars as well and do diabetic teaching.  Afterwards he was much more relaxed about DC. He was still planning on driving himself home, and stated that he wold be ready to go after lunch. Nurse and MD updated.

## 2020-10-01 NOTE — Progress Notes (Signed)
ANTICOAGULATION CONSULT NOTE - Follow Up Consult  Pharmacy Consult for Levenox Indication:  PE/DVT Rule Out  Allergies  Allergen Reactions   Elemental Sulfur Itching    Patient stated he's allergic to "sulfur" AND "sulfa"   Sulfa Antibiotics Itching    Patient Measurements: Height: '5\' 9"'$  (175.3 cm) Weight: 122.5 kg (270 lb 1 oz) IBW/kg (Calculated) : 70.7 Heparin Dosing Weight: 98.6 kg  Vital Signs: Temp: 98.6 F (37 C) (06/26 0402) BP: 144/73 (06/26 0402) Pulse Rate: 65 (06/26 0402)  Labs: Recent Labs    09/29/20 0009 09/29/20 0442 09/29/20 0845 09/29/20 1144 09/30/20 0208 10/01/20 0145  HGB 11.9*  --  11.8*  --  12.1* 11.1*  HCT 37.5*  --  36.3*  --  35.4* 33.3*  PLT 263  --   --   --  241 235  CREATININE 2.44*  --  2.16* 2.01* 1.85* 2.26*  TROPONINIHS 13 14  --   --   --   --     Estimated Creatinine Clearance: 47.7 mL/min (A) (by C-G formula based on SCr of 2.26 mg/dL (H)).   Medications:  Scheduled:   amLODipine  10 mg Oral Daily   aspirin EC  81 mg Oral Daily   atorvastatin  80 mg Oral Daily   bictegravir-emtricitabine-tenofovir AF  1 tablet Oral Daily   carvedilol  25 mg Oral BID WC   enoxaparin (LOVENOX) injection  60 mg Subcutaneous Q24H   escitalopram  20 mg Oral Daily   gabapentin  100 mg Oral TID   icosapent Ethyl  2 g Oral BID   insulin aspart  0-15 Units Subcutaneous TID WC   insulin aspart protamine- aspart  30 Units Subcutaneous BID WC   oxybutynin  5 mg Oral BID   ticagrelor  90 mg Oral BID   traZODone  100 mg Oral QHS    Assessment: 55 year old male with uncontrolled T2DM, s/p BKA. Patient presented with chest pain and elevated blood sugar. Pharmacy consulted to dose lovenox for PE/DVT rule-out. D-Dimer on admission 1.07.  Patient started on Levenox 1 mg/kg SUBQ every 12 hours. CrCl currently >30 mL/min. Hgb decreased overnight without bleeding.  Goal of Therapy:  Monitor platelets by anticoagulation protocol: Yes   Plan:  -  Continue Lovenox 120 mg SUBQ injection every 12 hours - Monitor renal function; adjust dose as necessary - Follow-up PE/DVT work-up     Brittie Whisnant L. Devin Going PharmD, [place] PGY2 Pharmacy Resident [phone] 10/01/20     9:03 AM  Please check AMION for all [place] phone numbers After 10:00 PM, call the [place] [phone]

## 2020-10-04 ENCOUNTER — Other Ambulatory Visit: Payer: Self-pay | Admitting: Family

## 2020-10-04 DIAGNOSIS — Z79899 Other long term (current) drug therapy: Secondary | ICD-10-CM | POA: Diagnosis not present

## 2020-10-04 DIAGNOSIS — G546 Phantom limb syndrome with pain: Secondary | ICD-10-CM | POA: Diagnosis not present

## 2020-10-04 DIAGNOSIS — I10 Essential (primary) hypertension: Secondary | ICD-10-CM | POA: Diagnosis not present

## 2020-10-04 DIAGNOSIS — B20 Human immunodeficiency virus [HIV] disease: Secondary | ICD-10-CM

## 2020-10-05 DIAGNOSIS — I639 Cerebral infarction, unspecified: Secondary | ICD-10-CM | POA: Diagnosis not present

## 2020-10-05 DIAGNOSIS — R69 Illness, unspecified: Secondary | ICD-10-CM | POA: Diagnosis not present

## 2020-10-05 DIAGNOSIS — E118 Type 2 diabetes mellitus with unspecified complications: Secondary | ICD-10-CM | POA: Diagnosis not present

## 2020-10-05 DIAGNOSIS — I10 Essential (primary) hypertension: Secondary | ICD-10-CM | POA: Diagnosis not present

## 2020-10-06 DIAGNOSIS — L97529 Non-pressure chronic ulcer of other part of left foot with unspecified severity: Secondary | ICD-10-CM | POA: Diagnosis not present

## 2020-10-07 ENCOUNTER — Emergency Department (HOSPITAL_COMMUNITY): Payer: Medicare HMO

## 2020-10-07 ENCOUNTER — Encounter (HOSPITAL_COMMUNITY): Payer: Self-pay | Admitting: Emergency Medicine

## 2020-10-07 ENCOUNTER — Emergency Department (HOSPITAL_COMMUNITY)
Admission: EM | Admit: 2020-10-07 | Discharge: 2020-10-08 | Disposition: A | Discharge status: Home / Self Care | Payer: Medicare HMO | Attending: Emergency Medicine | Admitting: Emergency Medicine

## 2020-10-07 ENCOUNTER — Other Ambulatory Visit: Payer: Self-pay

## 2020-10-07 DIAGNOSIS — I10 Essential (primary) hypertension: Secondary | ICD-10-CM | POA: Diagnosis not present

## 2020-10-07 DIAGNOSIS — R69 Illness, unspecified: Secondary | ICD-10-CM | POA: Diagnosis not present

## 2020-10-07 DIAGNOSIS — N184 Chronic kidney disease, stage 4 (severe): Secondary | ICD-10-CM | POA: Diagnosis not present

## 2020-10-07 DIAGNOSIS — R739 Hyperglycemia, unspecified: Secondary | ICD-10-CM | POA: Diagnosis not present

## 2020-10-07 DIAGNOSIS — Z7982 Long term (current) use of aspirin: Secondary | ICD-10-CM | POA: Diagnosis not present

## 2020-10-07 DIAGNOSIS — Z7984 Long term (current) use of oral hypoglycemic drugs: Secondary | ICD-10-CM | POA: Diagnosis not present

## 2020-10-07 DIAGNOSIS — Z79899 Other long term (current) drug therapy: Secondary | ICD-10-CM | POA: Diagnosis not present

## 2020-10-07 DIAGNOSIS — M7989 Other specified soft tissue disorders: Secondary | ICD-10-CM | POA: Diagnosis not present

## 2020-10-07 DIAGNOSIS — Z20822 Contact with and (suspected) exposure to covid-19: Secondary | ICD-10-CM | POA: Insufficient documentation

## 2020-10-07 DIAGNOSIS — L97521 Non-pressure chronic ulcer of other part of left foot limited to breakdown of skin: Secondary | ICD-10-CM | POA: Insufficient documentation

## 2020-10-07 DIAGNOSIS — R58 Hemorrhage, not elsewhere classified: Secondary | ICD-10-CM | POA: Diagnosis not present

## 2020-10-07 DIAGNOSIS — I2510 Atherosclerotic heart disease of native coronary artery without angina pectoris: Secondary | ICD-10-CM | POA: Diagnosis not present

## 2020-10-07 DIAGNOSIS — L97529 Non-pressure chronic ulcer of other part of left foot with unspecified severity: Secondary | ICD-10-CM | POA: Diagnosis not present

## 2020-10-07 DIAGNOSIS — E1165 Type 2 diabetes mellitus with hyperglycemia: Secondary | ICD-10-CM | POA: Diagnosis not present

## 2020-10-07 DIAGNOSIS — E1122 Type 2 diabetes mellitus with diabetic chronic kidney disease: Secondary | ICD-10-CM | POA: Diagnosis not present

## 2020-10-07 DIAGNOSIS — Z21 Asymptomatic human immunodeficiency virus [HIV] infection status: Secondary | ICD-10-CM | POA: Diagnosis not present

## 2020-10-07 DIAGNOSIS — Z794 Long term (current) use of insulin: Secondary | ICD-10-CM | POA: Diagnosis not present

## 2020-10-07 DIAGNOSIS — S91302A Unspecified open wound, left foot, initial encounter: Secondary | ICD-10-CM | POA: Diagnosis not present

## 2020-10-07 DIAGNOSIS — E11621 Type 2 diabetes mellitus with foot ulcer: Secondary | ICD-10-CM | POA: Diagnosis not present

## 2020-10-07 DIAGNOSIS — I1310 Hypertensive heart and chronic kidney disease without heart failure, with stage 1 through stage 4 chronic kidney disease, or unspecified chronic kidney disease: Secondary | ICD-10-CM | POA: Diagnosis not present

## 2020-10-07 LAB — CBC WITH DIFFERENTIAL/PLATELET
Abs Immature Granulocytes: 0.03 10*3/uL (ref 0.00–0.07)
Basophils Absolute: 0 10*3/uL (ref 0.0–0.1)
Basophils Relative: 0 %
Eosinophils Absolute: 0.1 10*3/uL (ref 0.0–0.5)
Eosinophils Relative: 1 %
HCT: 35.9 % — ABNORMAL LOW (ref 39.0–52.0)
Hemoglobin: 11.7 g/dL — ABNORMAL LOW (ref 13.0–17.0)
Immature Granulocytes: 0 %
Lymphocytes Relative: 14 %
Lymphs Abs: 1.4 10*3/uL (ref 0.7–4.0)
MCH: 27.1 pg (ref 26.0–34.0)
MCHC: 32.6 g/dL (ref 30.0–36.0)
MCV: 83.3 fL (ref 80.0–100.0)
Monocytes Absolute: 0.9 10*3/uL (ref 0.1–1.0)
Monocytes Relative: 8 %
Neutro Abs: 8 10*3/uL — ABNORMAL HIGH (ref 1.7–7.7)
Neutrophils Relative %: 77 %
Platelets: 267 10*3/uL (ref 150–400)
RBC: 4.31 MIL/uL (ref 4.22–5.81)
RDW: 12.8 % (ref 11.5–15.5)
WBC: 10.4 10*3/uL (ref 4.0–10.5)
nRBC: 0 % (ref 0.0–0.2)

## 2020-10-07 LAB — URINALYSIS, ROUTINE W REFLEX MICROSCOPIC
Bacteria, UA: NONE SEEN
Bilirubin Urine: NEGATIVE
Glucose, UA: >=500 mg/dL — AB
Hgb urine dipstick: NEGATIVE
Ketones, ur: NEGATIVE mg/dL
Leukocytes,Ua: NEGATIVE
Nitrite: NEGATIVE
Protein, ur: 100 mg/dL — AB
Specific Gravity, Urine: 1.016 (ref 1.005–1.030)
pH: 7 (ref 5.0–8.0)

## 2020-10-07 LAB — COMPREHENSIVE METABOLIC PANEL
ALT: 14 U/L (ref 0–44)
AST: 26 U/L (ref 15–41)
Albumin: 3.1 g/dL — ABNORMAL LOW (ref 3.5–5.0)
Alkaline Phosphatase: 91 U/L (ref 38–126)
Anion gap: 9 (ref 5–15)
BUN: 36 mg/dL — ABNORMAL HIGH (ref 6–20)
CO2: 25 mmol/L (ref 22–32)
Calcium: 9.2 mg/dL (ref 8.9–10.3)
Chloride: 94 mmol/L — ABNORMAL LOW (ref 98–111)
Creatinine, Ser: 2.37 mg/dL — ABNORMAL HIGH (ref 0.61–1.24)
GFR, Estimated: 32 mL/min — ABNORMAL LOW (ref >60)
Glucose, Bld: 606 mg/dL (ref 70–99)
Potassium: 5.5 mmol/L — ABNORMAL HIGH (ref 3.5–5.1)
Sodium: 128 mmol/L — ABNORMAL LOW (ref 135–145)
Total Bilirubin: 0.6 mg/dL (ref 0.3–1.2)
Total Protein: 8.3 g/dL — ABNORMAL HIGH (ref 6.5–8.1)

## 2020-10-07 LAB — CBG MONITORING, ED
Glucose-Capillary: 554 mg/dL (ref 70–99)
Glucose-Capillary: 506 mg/dL (ref 70–99)
Glucose-Capillary: 449 mg/dL — ABNORMAL HIGH (ref 70–99)

## 2020-10-07 LAB — C-REACTIVE PROTEIN: CRP: 6.7 mg/dL — ABNORMAL HIGH (ref <1.0)

## 2020-10-07 LAB — BLOOD GAS, VENOUS
Acid-base deficit: 0.9 mmol/L (ref 0.0–2.0)
Bicarbonate: 25 mmol/L (ref 20.0–28.0)
FIO2: 21
O2 Saturation: 71.9 %
Patient temperature: 98.6
pCO2, Ven: 49.6 mmHg (ref 44.0–60.0)
pH, Ven: 7.323 (ref 7.250–7.430)
pO2, Ven: 43.6 mmHg (ref 32.0–45.0)

## 2020-10-07 LAB — BETA-HYDROXYBUTYRIC ACID: Beta-Hydroxybutyric Acid: 0.4 mmol/L — ABNORMAL HIGH (ref 0.05–0.27)

## 2020-10-07 LAB — SEDIMENTATION RATE: Sed Rate: 85 mm/hr — ABNORMAL HIGH (ref 0–16)

## 2020-10-07 MED ORDER — LACTATED RINGERS IV BOLUS
1000.0000 mL | Freq: Once | INTRAVENOUS | Status: AC
  Administered 2020-10-07: 1000 mL via INTRAVENOUS

## 2020-10-07 MED ORDER — GADOBUTROL 1 MMOL/ML IV SOLN
10.0000 mL | Freq: Once | INTRAVENOUS | Status: AC | PRN
  Administered 2020-10-07: 10 mL via INTRAVENOUS

## 2020-10-07 MED ORDER — ACETAMINOPHEN 500 MG PO TABS
1000.0000 mg | ORAL_TABLET | Freq: Once | ORAL | Status: AC
  Administered 2020-10-07: 1000 mg via ORAL
  Filled 2020-10-07: qty 2

## 2020-10-07 MED ORDER — INSULIN ASPART 100 UNIT/ML IJ SOLN
20.0000 [IU] | Freq: Once | INTRAMUSCULAR | Status: AC
  Administered 2020-10-07: 20 [IU] via SUBCUTANEOUS
  Filled 2020-10-07: qty 0.2

## 2020-10-07 MED ORDER — DOXYCYCLINE HYCLATE 100 MG PO CAPS: 100.0000 mg | ORAL_CAPSULE | Freq: Two times a day (BID) | ORAL | 0 refills | Status: DC

## 2020-10-07 MED ORDER — SODIUM CHLORIDE 0.9 % IV BOLUS: 1000.0000 mL | Freq: Once | INTRAVENOUS | Status: DC

## 2020-10-07 MED ORDER — DOXYCYCLINE HYCLATE 100 MG PO TABS
100.0000 mg | ORAL_TABLET | Freq: Once | ORAL | Status: AC
  Administered 2020-10-07: 100 mg via ORAL
  Filled 2020-10-07: qty 1

## 2020-10-07 MED ORDER — SODIUM CHLORIDE 0.9 % IV BOLUS
1000.0000 mL | Freq: Once | INTRAVENOUS | Status: AC
  Administered 2020-10-07: 1000 mL via INTRAVENOUS

## 2020-10-07 NOTE — ED Triage Notes (Signed)
Patient presents with hyperglycemia and a wound on his left foot which has been bleeding. He reports having uncontrolled diabetes despite efforts to control it. He is also requesting a case worker and placement into a facility.    EMS vitals: 140/90 BP 88 HR 97% O2 sat on room air 20 Resp Rate High CBG

## 2020-10-07 NOTE — ED Provider Notes (Signed)
[facility] DEPT Provider Note   CSN: [id number] Arrival date & time: 10/07/20  1209     History Chief Complaint  Patient presents with   Hyperglycemia   foot wound    Keith [name] is a 55 y.o. male.  HPI 55 year old male presents with left foot bleeding.  He is also been hyperglycemic.  All of this has been over the last 1 week or so.  He states that he has had pain to his heel and bleeding.  The pain has been overall about a week and then the bleeding started last night.  He is not exactly sure where the bleeding is coming from.  He has not had any fevers.  He has chronic neuropathy of his feet.  No vomiting.  He reports compliance with his medications including his insulin.  He thinks he be better if he was in a nursing facility.  Currently he lives with his mom. Foot seems swollen to patient  Past Medical History:  Diagnosis Date   Abscess of right foot    abscess/ulcer of R transtibial amputation requiring IV abx   AIDS ([place])    Anemia    CAD (coronary artery disease)    a. MI with stenting of OM1 in 11/2018 with residual disease. b. acute STEMI 03/2020 s/p DES to OM1   Chronic anemia    Chronic knee pain    right   Chronic pain    CKD (chronic kidney disease), stage IV (HCC)    CVA (cerebral vascular accident) ([place]) 04/2020   Diabetes type 2, uncontrolled ([place])    HgA1c 17.6 (04/27/2010)   Diabetic foot ulcer ([place]) 01/2017   right foot   Dilatation of aorta (HCC)    Erectile dysfunction    Genital warts    HIV (human immunodeficiency virus infection) ([place]) 2009   CD4 count 100, VL 13800 (05/01/2010)   Hyperlipidemia    Hypertension    Noncompliance with medication regimen    Osteomyelitis ([place])    h/o hand   Osteomyelitis of metatarsal ([place]) 04/28/2017    Patient Active Problem List   Diagnosis Date Noted   Atypical chest pain 44/92/0100   Acute metabolic encephalopathy 71/21/9758   Hyperglycemia due to diabetes mellitus ([place])  08/29/2020   Hyperglycemia 08/29/2020   Acute cerebrovascular accident (CVA) due to ischemia ([place]) 04/28/2020   Type 2 diabetes mellitus with hyperlipidemia (HCC)    PAD (peripheral artery disease) ([place])    Hypoglycemia 04/25/2020   Acute ST elevation myocardial infarction (STEMI) due to occlusion of circumflex coronary artery ([place]) 03/22/2020   Uncontrolled type 2 diabetes mellitus with hyperglycemia ([place]) 01/14/2019   Elevated glucose 01/14/2019   Hx of BKA, right ([place]) 01/14/2019   Pressure injury of skin 11/11/2018   STEMI (ST elevation myocardial infarction) ([place]) 11/10/2018   CKD (chronic kidney disease) stage 4, GFR 15-29 ml/min ([place]) 11/10/2018   Amputation stump infection ([place]) 10/28/2018   Type II diabetes mellitus with renal manifestations ([place]) 08/07/2018   Uncontrolled type 2 diabetes mellitus with hyperosmolar nonketotic hyperglycemia ([place]) 08/07/2018   Non-pressure chronic ulcer of right calf limited to breakdown of skin ([place]) 07/06/2018   Type 2 diabetes mellitus without complication, without long-term current use of insulin ([place]) 06/15/2018   Chronic low back pain without sciatica 06/15/2018   Idiopathic chronic venous hypertension of left lower extremity with ulcer and inflammation (HCC)    Venous stasis ulcer of left calf limited to breakdown of skin without varicose veins (  [place]) 04/23/2018   Acquired absence of right leg below knee ([place]) 06/13/2017   Acute kidney injury superimposed on CKD ([place]) 03/15/2017   Diabetic polyneuropathy associated with type 2 diabetes mellitus ([place]) 01/23/2017   AKI (acute kidney injury) ([place]) 09/28/2016   Nausea vomiting and diarrhea 09/28/2016   Onychomycosis of multiple toenails with type 2 diabetes mellitus ([place]) 08/29/2015   MRSA carrier 04/20/2014   Penile wart 02/22/2014   HIV disease ([place])    Insulin-requiring or dependent type II diabetes mellitus ([place]) 02/04/2014   Dental anomaly 11/20/2012   Arthritis of right knee 02/23/2012    Hyperlipidemia 11/10/2011   Hyponatremia 11/10/2011   Chronic pain 08/07/2011   Meralgia paraesthetica 04/23/2011   ERECTILE DYSFUNCTION 08/22/2008   Essential hypertension 05/19/2008    Past Surgical History:  Procedure Laterality Date   AMPUTATION Right 05/02/2017   Procedure: AMPUTATION TRANSMETARSAL;  Surgeon: [name], MD;  Location: [facility];  Service: Orthopedics;  Laterality: Right;   AMPUTATION Right 06/13/2017   Procedure: RIGHT BELOW KNEE AMPUTATION;  Surgeon: [name], MD;  Location: [facility];  Service: Orthopedics;  Laterality: Right;   BELOW KNEE LEG AMPUTATION Right 06/13/2017   BELOW KNEE LEG AMPUTATION     CORONARY STENT INTERVENTION N/A 11/10/2018   Procedure: CORONARY STENT INTERVENTION;  Surgeon: Leonie Man, MD;  Location: [facility];  Service: Cardiovascular;  Laterality: N/A;   CORONARY/GRAFT ACUTE MI REVASCULARIZATION N/A 03/21/2020   Procedure: Coronary/Graft Acute MI Revascularization;  Surgeon: [name], MD;  Location: [facility];  Service: Cardiovascular;  Laterality: N/A;   HERNIA REPAIR     I & D EXTREMITY Left 08/21/2014   Procedure: INCISION AND DRAINAGE LEFT SMALL FINGER;  Surgeon: Leanora Cover, MD;  Location: [facility];  Service: Orthopedics;  Laterality: Left;   I & D EXTREMITY Right 03/18/2017   Procedure: IRRIGATION AND DEBRIDEMENT EXTREMITY;  Surgeon: [name], MD;  Location: [facility];  Service: Orthopedics;  Laterality: Right;   LEFT HEART CATH AND CORONARY ANGIOGRAPHY N/A 11/10/2018   Procedure: LEFT HEART CATH AND CORONARY ANGIOGRAPHY;  Surgeon: Leonie Man, MD;  Location: [facility];  Service: Cardiovascular;  Laterality: N/A;   LEFT HEART CATH AND CORONARY ANGIOGRAPHY N/A 03/21/2020   Procedure: LEFT HEART CATH AND CORONARY ANGIOGRAPHY;  Surgeon: [name], MD;  Location: [facility];  Service: Cardiovascular;  Laterality: N/A;   MINOR IRRIGATION AND DEBRIDEMENT OF WOUND Right 04/22/2014    Procedure: IRRIGATION AND DEBRIDEMENT OF RIGHT NECK ABCESS;  Surgeon: Jerrell Belfast, MD;  Location: [facility];  Service: ENT;  Laterality: Right;   MULTIPLE EXTRACTIONS WITH ALVEOLOPLASTY N/A 01/18/2013   Procedure: MULTIPLE EXTRACION 3, 6, 7, 10, 11, 13, 21, 22, 27, 28, 29, 30 WITH ALVEOLOPLASTY;  Surgeon: Gae Bon, DDS;  Location: [facility];  Service: Oral Surgery;  Laterality: N/A;   SKIN SPLIT GRAFT Right 03/21/2017   Procedure: IRRIGATION AND DEBRIDEMENT RIGHT FOOT AND APPLY SPLIT THICKNESS SKIN GRAFT AND WOUND VAC;  Surgeon: [name], MD;  Location: [facility];  Service: Orthopedics;  Laterality: Right;   TEE WITHOUT CARDIOVERSION N/A 05/02/2017   Procedure: TRANSESOPHAGEAL ECHOCARDIOGRAM (TEE);  Surgeon: Acie Fredrickson Wonda Cheng, MD;  Location: [facility];  Service: Cardiovascular;  Laterality: N/A;  coincidental to orthopedic case       Family History  Problem Relation Age of Onset   Hypertension Mother    Arthritis Father    Hypertension Father  Hypertension Brother    Cancer Maternal Grandmother 80       unknown type of cancer   Depression Paternal Grandmother     Social History   Tobacco Use   Smoking status: Never   Smokeless tobacco: Never  Vaping Use   Vaping Use: Never used  Substance Use Topics   Alcohol use: Not Currently   Drug use: Not Currently    Comment: OTC ASA    Home Medications Prior to Admission medications   Medication Sig Start Date End Date Taking? Authorizing Provider  doxycycline (VIBRAMYCIN) 100 MG capsule Take 1 capsule (100 mg total) by mouth 2 (two) times daily. One po bid x 7 days 10/07/20  Yes Sherwood Gambler, MD  acetaminophen (TYLENOL) 325 MG tablet Take 2 tablets (650 mg total) by mouth every 4 (four) hours as needed for mild pain, fever or headache. 05/03/20   Debbe Odea, MD  allopurinol (ZYLOPRIM) 300 MG tablet Take 300 mg by mouth daily. 06/09/20   [provider]  ALPRAZolam [name]) 1 MG tablet Take 1 mg by mouth daily as needed for  anxiety. 08/15/20   [provider]  amLODipine (NORVASC) 10 MG tablet Take 1 tablet (10 mg total) by mouth daily. 09/01/20 10/31/20  [name], MD  aspirin 81 MG EC tablet Take 1 tablet (81 mg total) by mouth daily. 03/27/20   [name], MD  atorvastatin (LIPITOR) 80 MG tablet Take 1 tablet (80 mg total) by mouth daily. 09/01/20 10/31/20  [name], MD  bictegravir-emtricitabine-tenofovir AF (BIKTARVY) 50-200-25 MG TABS tablet Take 1 tablet by mouth daily. 05/19/20   [name], MD  blood glucose meter kit and supplies Dispense based on patient and insurance preference. Use up to four times daily as directed. (FOR ICD-10 E10.9, E11.9). 07/17/20   [name], FNP  carvedilol (COREG) 25 MG tablet Take 1 tablet (25 mg total) by mouth 2 (two) times daily with a meal. 09/01/20 10/31/20  [name], MD  dapsone 100 MG tablet TAKE 1 TABLET (100 MG TOTAL) BY MOUTH DAILY. 03/27/20 03/27/21  [name], Ava, DO  escitalopram (LEXAPRO) 20 MG tablet Take 20 mg by mouth daily. 08/15/20   [provider]  gabapentin (NEURONTIN) 100 MG capsule Take 1 capsule (100 mg total) by mouth 3 (three) times daily. 09/01/20 10/31/20  [name], MD  glucose blood (COOL BLOOD GLUCOSE TEST STRIPS) test strip Use as instructed 02/16/20   [name], FNP  icosapent Ethyl (VASCEPA) 1 g capsule TAKE 2 CAPSULES (2 G TOTAL) BY MOUTH TWO TIMES DAILY. 03/27/20 03/27/21  [name], MD  insulin aspart protamine- aspart (NOVOLOG MIX 70/30) (70-30) 100 UNIT/ML injection Inject 0.3 mLs (30 Units total) into the skin 2 (two) times daily with a meal. With breakfast and dinner. 10/01/20 12/30/20  Enzo Bi, MD  Lancets [facility] ULTRASOFT) lancets Use as instructed 02/16/20   [name], FNP  metFORMIN (GLUCOPHAGE) 1000 MG tablet Take 1,000 mg by mouth 2 (two) times daily. 09/05/20   [provider]  nitroGLYCERIN (NITROSTAT) 0.4 MG SL tablet PLACE 1 TABLET (0.4 MG TOTAL) UNDER THE TONGUE  EVERY FIVE MINUTES X 3 DOSES AS NEEDED FOR CHEST PAIN. 03/27/20 03/27/21  [name], Ava, DO  NOVOLOG MIX 70/30 FLEXPEN (70-30) 100 UNIT/ML FlexPen Inject 30 Units into the skin 2 (two) times daily. 10/01/20   [provider]  omega-3 acid ethyl esters (LOVAZA) 1 g capsule Take 1 g by mouth 2 (two) times  daily.    [provider]  oxybutynin (DITROPAN) 5 MG tablet TAKE 1 TABLET (5 MG TOTAL) BY MOUTH TWO TIMES DAILY. 03/27/20 03/27/21  [name], MD  oxyCODONE 10 MG TABS Take 1 tablet (10 mg total) by mouth every 6 (six) hours as needed for severe pain (Severe pain.). 05/03/20   Debbe Odea, MD  traZODone (DESYREL) 100 MG tablet Take 100 mg by mouth at bedtime. 09/11/20   [provider]    Allergies    Elemental sulfur and Sulfa antibiotics  Review of Systems   Review of Systems  Constitutional:  Negative for fever.  Gastrointestinal:  Negative for vomiting.  Musculoskeletal:  Positive for arthralgias.  Skin:  Positive for wound.  All other systems reviewed and are negative.  Physical Exam Updated Vital Signs BP (!) 134/94   Pulse 70   Temp 98 F (36.7 C) (Oral)   Resp 18   Ht $R'5\' 10"'Qt$  (1.778 m)   Wt 127 kg   SpO2 99%   BMI 40.18 kg/m   Physical Exam Vitals and nursing note reviewed.  Constitutional:      Appearance: He is well-developed. He is obese.  HENT:     Head: Normocephalic and atraumatic.     Right Ear: External ear normal.     Left Ear: External ear normal.     Nose: Nose normal.  Eyes:     General:        Right eye: No discharge.        Left eye: No discharge.  Cardiovascular:     Rate and Rhythm: Normal rate and regular rhythm.     Pulses:          Dorsalis pedis pulses are 2+ on the left side.     Heart sounds: Normal heart sounds.  Pulmonary:     Effort: Pulmonary effort is normal.     Breath sounds: Normal breath sounds.  Abdominal:     Palpations: Abdomen is soft.     Tenderness: There is no abdominal tenderness.   Musculoskeletal:     Cervical back: Neck supple.     Comments: See picture. There is a shallow ulcer to the left plantar heel. Distal to this appears to be a friable area that was probably bleeding. The rest of his plantar foot is covered in dried blood. No laceration. No obvious cellulitis. Diffuse moderate swelling Right sided BKA  Skin:    General: Skin is warm and dry.  Neurological:     Mental Status: He is alert.  Psychiatric:        Mood and Affect: Mood is not anxious.       ED Results / Procedures / Treatments   Labs (all labs ordered are listed, but only abnormal results are displayed) Labs Reviewed  COMPREHENSIVE METABOLIC PANEL - Abnormal; Notable for the following components:      Result Value   Sodium 128 (*)    Potassium 5.5 (*)    Chloride 94 (*)    Glucose, Bld 606 (*)    BUN 36 (*)    Creatinine, Ser 2.37 (*)    Total Protein 8.3 (*)    Albumin 3.1 (*)    GFR, Estimated 32 (*)    All other components within normal limits  CBC WITH DIFFERENTIAL/PLATELET - Abnormal; Notable for the following components:   Hemoglobin 11.7 (*)    HCT 35.9 (*)    Neutro Abs 8.0 (*)    All other components  within normal limits  URINALYSIS, ROUTINE W REFLEX MICROSCOPIC - Abnormal; Notable for the following components:   Color, Urine STRAW (*)    Glucose, UA >=500 (*)    Protein, ur 100 (*)    All other components within normal limits  BETA-HYDROXYBUTYRIC ACID - Abnormal; Notable for the following components:   Beta-Hydroxybutyric Acid 0.40 (*)    All other components within normal limits  SEDIMENTATION RATE - Abnormal; Notable for the following components:   Sed Rate 85 (*)    All other components within normal limits  C-REACTIVE PROTEIN - Abnormal; Notable for the following components:   CRP 6.7 (*)    All other components within normal limits  CBG MONITORING, ED - Abnormal; Notable for the following components:   Glucose-Capillary 554 (*)    All other components  within normal limits  CBG MONITORING, ED - Abnormal; Notable for the following components:   Glucose-Capillary 506 (*)    All other components within normal limits  CBG MONITORING, ED - Abnormal; Notable for the following components:   Glucose-Capillary 449 (*)    All other components within normal limits  SARS CORONAVIRUS 2 (TAT 6-24 HRS)  BLOOD GAS, VENOUS    EKG None  Radiology DG Foot Complete Left  Result Date: 10/07/2020 CLINICAL DATA:  Left foot wound. EXAM: LEFT FOOT - COMPLETE 3+ VIEW COMPARISON:  None. FINDINGS: There is no evidence of fracture or dislocation. There is no evidence of arthropathy or other focal bone abnormality. Dorsal soft tissue swelling is noted. Soft tissue ulceration is seen inferior to posterior calcaneus. No lytic destruction is seen to suggest osteomyelitis. IMPRESSION: Soft tissue ulceration seen inferior to posterior calcaneus. No definite evidence of osteomyelitis. Electronically Signed   By: Marijo Conception M.D.   On: 10/07/2020 13:39    Procedures Procedures   Medications Ordered in ED Medications  sodium chloride 0.9 % bolus 1,000 mL (0 mLs Intravenous Stopped 10/07/20 1656)  acetaminophen (TYLENOL) tablet 1,000 mg (1,000 mg Oral Given 10/07/20 1437)  insulin aspart (novoLOG) injection 20 Units (20 Units Subcutaneous Given 10/07/20 1639)  lactated ringers bolus 1,000 mL (1,000 mLs Intravenous New Bag/Given 10/07/20 1639)  gadobutrol (GADAVIST) 1 MMOL/ML injection 10 mL (10 mLs Intravenous Contrast Given 10/07/20 1800)  doxycycline (VIBRA-TABS) tablet 100 mg (100 mg Oral Given 10/07/20 1916)    ED Course  I have reviewed the triage vital signs and the nursing notes.  Pertinent labs & imaging results that were available during my care of the patient were reviewed by me and considered in my medical decision making (see chart for details).  Clinical Course as of 10/07/20 2236  Sat Oct 07, 2020  1551 Patient is well appearing. However he's quite  hyperglycemic, though not acidotic. Cr is near baseline. Has some elevated inflammatory markers. Discussed with hospitalist, Dr. Dione Plover, who notes patient is questionable for admission. We decided to get MRI. If shows osteo/surgical issue or need for IV antibiotics, will admit. Otherwise will treat with antibiotics as outpatient.  [SG]    Clinical Course User Index [SG] Sherwood Gambler, MD   MDM Rules/Calculators/A&P                          Patient's vital signs are stable.  He has a mild left shift and some elevated inflammatory markers.  MRI preliminary read shows no obvious abscess or osteomyelitis.  Thus per prior plan with hospitalist it would be reasonable to treat him  with oral antibiotics.  His glucose is elevated but no obvious DKA.  He has a mild hyperkalemia and I will have him hold his lisinopril.  Otherwise, he has no acute ECG changes and he was given insulin and fluids.  At this point I think it would be reasonable to discharge with return precautions. Final Clinical Impression(s) / ED Diagnoses Final diagnoses:  Ulcer of left foot, limited to breakdown of skin ([place])  Hyperglycemia    Rx / DC Orders ED Discharge Orders          Ordered    doxycycline (VIBRAMYCIN) 100 MG capsule  2 times daily        10/07/20 1936             Sherwood Gambler, MD 10/07/20 2237

## 2020-10-07 NOTE — Discharge Instructions (Addendum)
Do NOT take your LISINOPRIL, as your potassium was a little elevated. You will need to follow up with your primary care doctor to get your potassium rechecked.   If you develop fever, new or worsening foot pain, foot swelling, redness to the foot, or any other new/concerning symptoms then return to the ER for evaluation.

## 2020-10-07 NOTE — Discharge Summary (Addendum)
Physician Discharge Summary   Keith [name]  male DOB: [birth year]  DHH:[id number]  PCP: Pcp, No  Admit date: 09/28/2020 Discharge date: 10/01/2020  Admitted From: home Disposition:  home Home Health: Yes  HH PT/OT/RN/TOC  Pt kept asking to be discharged to SNF so someone can take care of him, however, PT/OT did not find pt to have acute rehab needs.  Pt has a prosthetic leg and still drives.  Outpatient TOC ordered to assist with future needs.  CODE STATUS: Full code  Discharge Instructions     Discharge instructions   Complete by: As directed    Your insulin regimen has been changed to Novolog 70/30, 30 units twice a day with breakfast and dinner, to make your insulin regimen simpler.  Please follow up with your outpatient doctor for further adjustment.  Please hold your Lisinopril because you kidney function has been declining, which likely is due to uncontrolled diabetes. - -   Discharge wound care:   Complete by: As directed    Home health wound care nurse has been ordered.    Continue double extra-large compression sock to be worn 24 hours a day.  Take it off wash shower and dry in place and move sock.  And elevating leg.        Hospital Course:  For full details, please see H&P, progress notes, consult notes and ancillary notes.  Briefly,  Keith [name] is a 55 y.o. male with medical history significant of HTN, HLD, CAD s/p stenting, DM type II uncontrolled, AIDS, CKD, CVA, chronic pain, chronic ulcer of the left leg, and s/p right BKA presented with complaints of chest pain and elevated blood sugar over the last 1-2 weeks.     Diabetes mellitus type 2 uncontrolled with hyperglycemia Insulin non-compliance --On admission patient presents with glucose 855, sodium 119, CO2 22, and anion gap 11.  Patient did not appear to be in DKA. His last hemoglobin A1c on 08/29/2020 was 13.9.  Hospitalized last month for the same.  Patient was given 1 L normal saline IV fluids and  started on insulin drip.  He reported that he was taking 20 units of NovoLog with meals.  However, it appears he should have been on metformin 1000 mg twice daily, NovoLog 10 units with meals, and Lantus 40 units nightly. --Since pt was not taking all his insulin as prescribed, his insulin regimen was changed to 70/30 30u BID for simpler regimen.  Prior to discharge, I had called pharm to ensure pt's insurance would cover this 70/30 insulin in the form of pen injector.  Pt agreed to drive to pharm to pick up the insulin on his way home.  Pt was advised to stop using all his other insulin at home and just use the new one he will be picking up.   Atypical chest pain, ACS ruled out --trop neg x2   Acute metabolic encephalopathy, resolved Patient was noted to be lethargic but easily arousable on physical exam.  Maybe secondary to patient's hyperglycemia.   PseudoHyponatremia --2/2 hyperglycemia    UTI, ruled out --d/c ceftriaxone   Acute kidney injury, ruled out  CKD 3b Pt's Cr is at baseline.     Essential hypertension:  BP varied widely --cont coreg and amlodipine --Lisinopril held at discharge due to worsening kidney function   HIV/AIDS -Continue Biktarvy and dapsone -Continue outpatient follow-up with ID   CAD:  Patient with previous history of stent to OM1 in 11/26/2018 and DES to  OM1 in 03/2020. -on home Brilinta, aspirin, beta-blocker, and statin --cont above home regimen   History of CVA: Patient reports having residual left lower extremity weakness. -Continue statin and antiplatelet   Chronic pain on chronic opioids --cont home oxycodone 10 mg PRN   Venous ulcer of the left leg, Chronic.   He had followed up with orthopedics on 6/7 and was told that the wound was healing.  He was advised to continue the double extra-large compression sock and to wear it 24 hours only taking off to shower and dry in place.  Patient states he still been having clear drainage, but denies any  significant redness or erythema on the wound. -Continue outpatient follow-up with [name] --[facility] RN ordered to help monitor and care for wound   Anxiety -Continue Lexapro  --cont Xanax at reduced 0.5 mg PRN   S/p Right BKA    Discharge Diagnoses:  Principal Problem:   Uncontrolled type 2 diabetes mellitus with hyperosmolar nonketotic hyperglycemia ([place]) Active Problems:   Essential hypertension   Hyponatremia   Acute kidney injury superimposed on CKD ([place])   Venous stasis ulcer of left calf limited to breakdown of skin without varicose veins (H[facility])   Hx of BKA, right ([facility])   Hyperglycemia   Atypical chest pain   Acute metabolic encephalopathy   30 Day Unplanned Readmission Risk Score    Flowsheet Row ED to Hosp-Admission (Discharged) from 09/28/2020 in [place] [facility]  30 Day Unplanned Readmission Risk Score (%) 31.47 Filed at 10/01/2020 1600       This score is the patient's risk of an unplanned readmission within 30 days of being discharged (0 -100%). The score is based on dignosis, age, lab data, medications, orders, and past utilization.   Low:  0-14.9   Medium: 15-21.9   High: 22-29.9   Extreme: 30 and above          Discharge Instructions:  Allergies as of 10/01/2020       Reactions   Elemental Sulfur Itching   Patient stated he's allergic to "sulfur" AND "sulfa"   Sulfa Antibiotics Itching        Medication List     STOP taking these medications    Easy Comfort Pen Needles 31G X 5 MM Misc Generic drug: Insulin Pen Needle   insulin aspart 100 UNIT/ML FlexPen Commonly known as: NOVOLOG   insulin glargine 100 UNIT/ML injection Commonly known as: LANTUS   Insulin Pen Needle 32G X 4 MM Misc   Insulin Syringes (Disposable) U-100 0.3 ML Misc       TAKE these medications    acetaminophen 325 MG tablet Commonly known as: TYLENOL Take 2 tablets (650 mg total) by mouth every 4 (four) hours as needed for mild pain, fever or  headache.   allopurinol 300 MG tablet Commonly known as: ZYLOPRIM Take 300 mg by mouth daily.   ALPRAZolam 1 MG tablet Commonly known as: XANAX Take 1 mg by mouth daily as needed for anxiety.   amLODipine 10 MG tablet Commonly known as: NORVASC Take 1 tablet (10 mg total) by mouth daily.   aspirin 81 MG EC tablet Take 1 tablet (81 mg total) by mouth daily.   atorvastatin 80 MG tablet Commonly known as: LIPITOR Take 1 tablet (80 mg total) by mouth daily.   Biktarvy 50-200-25 MG Tabs tablet Generic drug: bictegravir-emtricitabine-tenofovir AF Take 1 tablet by mouth daily.   blood glucose meter kit and supplies Dispense based on patient  and insurance preference. Use up to four times daily as directed. (FOR ICD-10 E10.9, E11.9). What changed:  how much to take how to take this when to take this   carvedilol 25 MG tablet Commonly known as: COREG Take 1 tablet (25 mg total) by mouth 2 (two) times daily with a meal.   Cool Blood Glucose Test Strips test strip Generic drug: glucose blood Use as instructed What changed:  how much to take how to take this when to take this additional instructions   dapsone 100 MG tablet TAKE 1 TABLET (100 MG TOTAL) BY MOUTH DAILY. What changed: how much to take   escitalopram 20 MG tablet Commonly known as: LEXAPRO Take 20 mg by mouth daily.   gabapentin 100 MG capsule Commonly known as: NEURONTIN Take 1 capsule (100 mg total) by mouth 3 (three) times daily.   insulin aspart protamine- aspart (70-30) 100 UNIT/ML injection Commonly known as: NOVOLOG MIX 70/30 Inject 0.3 mLs (30 Units total) into the skin 2 (two) times daily with a meal. With breakfast and dinner.   lisinopril 5 MG tablet Commonly known as: ZESTRIL Hold due to worsening kidney function. What changed:  how much to take how to take this when to take this additional instructions   metFORMIN 1000 MG tablet Commonly known as: GLUCOPHAGE Take 1,000 mg by mouth 2  (two) times daily.   nitroGLYCERIN 0.4 MG SL tablet Commonly known as: NITROSTAT PLACE 1 TABLET (0.4 MG TOTAL) UNDER THE TONGUE EVERY FIVE MINUTES X 3 DOSES AS NEEDED FOR CHEST PAIN. What changed: how much to take   omega-3 acid ethyl esters 1 g capsule Commonly known as: LOVAZA Take 1 g by mouth 2 (two) times daily.   onetouch ultrasoft lancets Use as instructed What changed:  how much to take how to take this when to take this additional instructions   oxybutynin 5 MG tablet Commonly known as: DITROPAN TAKE 1 TABLET (5 MG TOTAL) BY MOUTH TWO TIMES DAILY. What changed: how much to take   Oxycodone HCl 10 MG Tabs Take 1 tablet (10 mg total) by mouth every 6 (six) hours as needed for severe pain (Severe pain.). What changed: when to take this   traZODone 100 MG tablet Commonly known as: DESYREL Take 100 mg by mouth at bedtime.   Vascepa 1 g capsule Generic drug: icosapent Ethyl TAKE 2 CAPSULES (2 G TOTAL) BY MOUTH TWO TIMES DAILY. What changed:  how much to take how to take this when to take this       ASK your doctor about these medications    ticagrelor 90 MG Tabs tablet Commonly known as: BRILINTA Take 1 tablet (90 mg total) by mouth 2 (two) times daily. Ask about: Should I take this medication?               Discharge Care Instructions  (From admission, onward)           Start     Ordered   10/01/20 0000  Discharge wound care:       Comments: Home health wound care nurse has been ordered.    Continue double extra-large compression sock to be worn 24 hours a day.  Take it off wash shower and dry in place and move sock.  And elevating leg.   10/01/20 0902             Follow-up Information     [facility]. Call.   Why: Make appointment to  establish a priamry care doctor Contact information: [address] [place] [address]        [facility] Follow up.    Specialty: Home Health Services Why: for home health services. they will call you in 1-2 days to set up your first home appointment Contact information: [place] [address] [phone]                 Allergies  Allergen Reactions   Elemental Sulfur Itching    Patient stated he's allergic to "sulfur" AND "sulfa"   Sulfa Antibiotics Itching     The results of significant diagnostics from this hospitalization (including imaging, microbiology, ancillary and laboratory) are listed below for reference.   Consultations:   Procedures/Studies: DG Chest 2 View  Result Date: 09/29/2020 CLINICAL DATA:  Chest pain EXAM: CHEST - 2 VIEW COMPARISON:  03/21/2020 FINDINGS: The heart size and mediastinal contours are within normal limits. Both lungs are clear. The visualized skeletal structures are unremarkable. IMPRESSION: No active cardiopulmonary disease. Electronically Signed   By: Fidela Salisbury MD   On: 09/29/2020 00:38   DG Foot Complete Left  Result Date: 10/07/2020 CLINICAL DATA:  Left foot wound. EXAM: LEFT FOOT - COMPLETE 3+ VIEW COMPARISON:  None. FINDINGS: There is no evidence of fracture or dislocation. There is no evidence of arthropathy or other focal bone abnormality. Dorsal soft tissue swelling is noted. Soft tissue ulceration is seen inferior to posterior calcaneus. No lytic destruction is seen to suggest osteomyelitis. IMPRESSION: Soft tissue ulceration seen inferior to posterior calcaneus. No definite evidence of osteomyelitis. Electronically Signed   By: Marijo Conception M.D.   On: 10/07/2020 13:39   VAS [place] LOWER EXTREMITY VENOUS (DVT)  Result Date: 09/30/2020  Lower Venous DVT Study Patient Name:  Keith [name]  Date of Exam:   09/30/2020 Medical Rec #: [id number]       Accession #:    [id number] Date of Birth: 1966/02/27       Patient Gender: M Patient Age:   54Y Exam Location:  [facility] Procedure:      VAS [place] LOWER EXTREMITY VENOUS  (DVT) Referring Phys: [id number] [place] --------------------------------------------------------------------------------  Indications: Edema.  Limitations: Body habitus, poor ultrasound/tissue interface, bandages and patient position, patient unable to fully cooperate. Comparison Study: No prior study on file Performing Technologist: Maudry Mayhew MHA, RDMS, RVT, RDCS  Examination Guidelines: A complete evaluation includes B-mode imaging, spectral Doppler, color Doppler, and power Doppler as needed of all accessible portions of each vessel. Bilateral testing is considered an integral part of a complete examination. Limited examinations for reoccurring indications may be performed as noted. The reflux portion of the exam is performed with the patient in reverse Trendelenburg.  Right Technical Findings: Not visualized segments include CFV.  +---------+---------------+---------+-----------+----------+--------------+ LEFT     CompressibilityPhasicitySpontaneityPropertiesThrombus Aging +---------+---------------+---------+-----------+----------+--------------+ CFV      Full           Yes      Yes        patent                   +---------+---------------+---------+-----------+----------+--------------+ SFJ      Full                               patent                   +---------+---------------+---------+-----------+----------+--------------+  FV Prox  Full                               patent                   +---------+---------------+---------+-----------+----------+--------------+ FV Mid   Full                               patent                   +---------+---------------+---------+-----------+----------+--------------+ FV DistalFull                               patent                   +---------+---------------+---------+-----------+----------+--------------+ PFV      Full                               patent                    +---------+---------------+---------+-----------+----------+--------------+ POP      Full           Yes      Yes        patent                   +---------+---------------+---------+-----------+----------+--------------+ PTV                              Yes        patent                   +---------+---------------+---------+-----------+----------+--------------+   Left Technical Findings: Not visualized segments include peroneal veins. Limited visualization of PTV.   Summary: LEFT: - There is no evidence of deep vein thrombosis in the lower extremity. However, portions of this examination were limited- see technologist comments above.  - No cystic structure found in the popliteal fossa.  *See table(s) above for measurements and observations. Electronically signed by Servando Snare MD on 09/30/2020 at 1:36:09 PM.    Final       Labs: BNP (last 3 results) No results for input(s): BNP in the last 8760 hours. Basic Metabolic Panel: Recent Labs  Lab 10/01/20 0145 10/07/20 1405  NA 132* 128*  K 3.8 5.5*  CL 102 94*  CO2 22 25  GLUCOSE 261* 606*  BUN 23* 36*  CREATININE 2.26* 2.37*  CALCIUM 8.8* 9.2  MG 1.9  --    Liver Function Tests: Recent Labs  Lab 10/07/20 1405  AST 26  ALT 14  ALKPHOS 91  BILITOT 0.6  PROT 8.3*  ALBUMIN 3.1*   No results for input(s): LIPASE, AMYLASE in the last 168 hours. No results for input(s): AMMONIA in the last 168 hours. CBC: Recent Labs  Lab 10/01/20 0145 10/07/20 1405  WBC 6.5 10.4  NEUTROABS  --  8.0*  HGB 11.1* 11.7*  HCT 33.3* 35.9*  MCV 82.2 83.3  PLT 235 267   Cardiac Enzymes: No results for input(s): CKTOTAL, CKMB, CKMBINDEX, TROPONINI in the last 168 hours. BNP: Invalid input(s): POCBNP CBG: Recent Labs  Lab 10/01/20 0715 10/01/20 1351 10/01/20 1615 10/07/20 1218 10/07/20 1636  GLUCAP 259*  357* 199* 554* 506*   D-Dimer No results for input(s): DDIMER in the last 72 hours. Hgb A1c No results for input(s):  HGBA1C in the last 72 hours. Lipid Profile No results for input(s): CHOL, HDL, LDLCALC, TRIG, CHOLHDL, LDLDIRECT in the last 72 hours. Thyroid function studies No results for input(s): TSH, T4TOTAL, T3FREE, THYROIDAB in the last 72 hours.  Invalid input(s): FREET3 Anemia work up No results for input(s): VITAMINB12, FOLATE, FERRITIN, TIBC, IRON, RETICCTPCT in the last 72 hours. Urinalysis    Component Value Date/Time   COLORURINE STRAW (A) 10/07/2020 1405   APPEARANCEUR CLEAR 10/07/2020 1405   LABSPEC 1.016 10/07/2020 1405   PHURINE 7.0 10/07/2020 1405   GLUCOSEU >=500 (A) 10/07/2020 1405   HGBUR NEGATIVE 10/07/2020 1405   BILIRUBINUR NEGATIVE 10/07/2020 1405   BILIRUBINUR negative 09/07/2018 1034   KETONESUR NEGATIVE 10/07/2020 1405   PROTEINUR 100 (A) 10/07/2020 1405   UROBILINOGEN 1.0 09/07/2018 1034   UROBILINOGEN 1.0 01/01/2017 1032   NITRITE NEGATIVE 10/07/2020 1405   LEUKOCYTESUR NEGATIVE 10/07/2020 1405   Sepsis Labs Invalid input(s): PROCALCITONIN,  WBC,  LACTICIDVEN Microbiology Recent Results (from the past 240 hour(s))  SARS CORONAVIRUS 2 (TAT 6-24 HRS) Nasopharyngeal Nasopharyngeal Swab     Status: None   Collection Time: 09/29/20  8:47 AM   Specimen: Nasopharyngeal Swab  Result Value Ref Range Status   SARS Coronavirus 2 NEGATIVE NEGATIVE Final    Comment: (NOTE) SARS-CoV-2 target nucleic acids are NOT DETECTED.  The SARS-CoV-2 RNA is generally detectable in upper and lower respiratory specimens during the acute phase of infection. Negative results do not preclude SARS-CoV-2 infection, do not rule out co-infections with other pathogens, and should not be used as the sole basis for treatment or other patient management decisions. Negative results must be combined with clinical observations, patient history, and epidemiological information. The expected result is Negative.  Fact Sheet for Patients: SugarRoll.be  Fact Sheet for  Healthcare Providers: https://www.woods-mathews.com/  This test is not yet approved or cleared by the [place] FDA and  has been authorized for detection and/or diagnosis of SARS-CoV-2 by FDA under an Emergency Use Authorization (EUA). This EUA will remain  in effect (meaning this test can be used) for the duration of the COVID-19 declaration under Se ction 564(b)(1) of the Act, 21 U.S.C. section 360bbb-3(b)(1), unless the authorization is terminated or revoked sooner.  Performed at [facility], [place] [address] [place], [place] [address]   Culture, Urine     Status: Abnormal   Collection Time: 09/29/20  9:17 AM   Specimen: Urine, Random  Result Value Ref Range Status   Specimen Description URINE, RANDOM  Final   Special Requests   Final    NONE Performed at [facility], [place] [address] [place], [place] [address]    Culture MULTIPLE SPECIES PRESENT, SUGGEST RECOLLECTION (A)  Final   Report Status 09/30/2020 FINAL  Final  MRSA Next Gen by PCR, Nasal     Status: None   Collection Time: 09/29/20  5:29 PM   Specimen: Nasal Mucosa; Nasal Swab  Result Value Ref Range Status   MRSA by PCR Next Gen NOT DETECTED NOT DETECTED Final    Comment: (NOTE) The GeneXpert MRSA Assay (FDA approved for NASAL specimens only), is one component of a comprehensive MRSA colonization surveillance program. It is not intended to diagnose MRSA infection nor to guide or monitor treatment for MRSA infections. Test performance is not FDA approved in patients less than  34 years old. Performed at [facility], [place] [address] [place], [place] [address]      Total time spend on discharging this patient, including the last patient exam, discussing the hospital stay, instructions for ongoing care as it relates to all pertinent caregivers, as well as preparing the medical discharge records, prescriptions, and/or referrals as applicable, is 50 minutes.    Enzo Bi,  MD  [facility] 10/07/2020, 6:01 PM

## 2020-10-08 DIAGNOSIS — I10 Essential (primary) hypertension: Secondary | ICD-10-CM | POA: Diagnosis not present

## 2020-10-08 DIAGNOSIS — Z743 Need for continuous supervision: Secondary | ICD-10-CM | POA: Diagnosis not present

## 2020-10-08 LAB — SARS CORONAVIRUS 2 (TAT 6-24 HRS): SARS Coronavirus 2: NEGATIVE

## 2020-10-10 ENCOUNTER — Other Ambulatory Visit: Payer: Medicare HMO | Admitting: *Deleted

## 2020-10-10 NOTE — Patient Outreach (Signed)
[place] [facility]) Care Management  10/10/2020  Keith [name] 1965-11-19 [id number]   Telephone Assessment   Referral received : 09/20/20 Referral source; Insurance Plan Insurance : Keith [name], [facility] Admissions 2 in last 2 months.   Hospital Admission 6/23-6/29 Dx Hyperglycemia Glucose 123456 , Acute metabolic encephalopathy resolved, chronic venous ulcer left leg, ulcer left heel .   ED visit 10/07/20 Hyperglycemia glucose 554 Ulcer left foot.   PMHX: includes but not limited to Diabetes type 2, A1c 13.9%, HIV, CKD, CVA, Chronic pain, chronic ulcer left leg, s/p right BKA, HTN, HLD, CAD s/p stent.    # 2 Outreach Attempt  Subjective: Unsuccessful outreach call to patient preferred contact number received message called party temporarily unavailable, unable to leave a message.  Placed call to patient emergency contact number his mother Keith [name] , she states patient unavailable at this time, she will have patient return call to me, at provided contact number   Received incoming call from patient, explained reason for the call and [facility] care management .    Social  Patient lives at home with his mother Keith [name]. Keith [name] states that he used to be able to drive himself to appointments but he is unable now. He reports sometimes a friend runs errands or provides transportation for him. He states that his mother does not drive she is able to prepare meals for him. Patient reports being independent with doing sponge bath at home, He uses a rolling walker at home . He questions whether he should go to a nursing home, stating I think they let me out to soon the last time.  Patient has medicaid reports that he has never used the transportation benefit.  Patient states that he has not received phone call from Home health agency ordered at discharge.    Conditions  He discussed medical conditions of Diabetes recalling recent admission and ED visit related to high  blood sugars.  Patient states that he does not monitor his blood sugars at home reporting never having a meter or supplies.  He verifies taking insulin as prescribed.  He discussed concern regarding left foot ulcer and wound ,he discussed prior follow up with Keith [name] and missing last appointment . He reports that he is wearing the compression sock, and denies new bleeding at site.  He voiced concern regarding prothesis for right leg not fitting well and stating he will need to follow up with [facility] on [address] after seeing Keith [name].   Medications  Patient is not able to review full medication list, he was state dose of 70/30 insulin 30 units twice daily. He was able to locate lisinopril pill bottle educated this medication on hold , he was able to repeat and will place in different location from remaining medication.  Placed call to [facility] verified patient filled recent 70/30 insulin   Placed call [facility] only able to leave a message to follow up on Blood sugar meters prescription on file as well as new prescription for Doxycycline only able to leave a message patient is inquiring whether prescriptions can be delivered.     Appointments Post hospital visit with PCP- July 13 Will need follow up with Keith [name].    Goals Addressed             This Visit's Progress    Find Help in My Community       Timeframe:  Short-Term Goal Priority:  High Start Date:  10/10/20                           Expected End Date: 11/10/20                      Follow Up Date 10/12/2020 Barriers: Health Behaviors Knowledge Support System Transportation     - follow-up on any referrals for help I am [name]    Why is this important?   Knowing how and where to find help for yourself or family in your neighborhood and community is an important skill.  You will want to take some steps to learn how.    Notes: Placed call to [facility] to follow up on home health services, placed call to  [name] as noted [facility] sent referral to [place], spoke with representative to follow up .       Make and Keep All Appointments       Timeframe:  Long-Range Goal Priority:  Medium Start Date:     10/10/20                        Expected End Date:  12/6520                     Follow Up Date 10/17/2020   Barriers: Transportation   - arrange a ride through an agency 1 week before appointment - keep a calendar with appointment dates    Why is this important?   Part of staying healthy is seeing the doctor for follow-up care.  If you forget your appointments, there are some things you can do to stay on track.    Notes: Reviewed upcoming appointments with PCP on 7/13, and need to rescheduled missed ortho appointment with Keith [name] for follow up on left leg wound .Reviewed transportation need patient reports being unable to drive at this time, placed call to medicaid transportation to arrange transportation. Patient will need follow up with transportation specialist for assessment, before he can arrange ride. Will assist with alternate transportation until established        Manage My Medicine       Timeframe:  Long-Range Goal Priority:  High Start Date:  10/10/20                           Expected End Date:  12/11/20                     Follow Up Date 10/12/2020    - call for medicine refill 2 or 3 days before it runs out - learn to read medicine labels    Why is this important?   These steps will help you keep on track with your medicines.   Notes:  Reinforced taking medications as prescribed, reviewed insulin taking patient reads label,  reviewed recent discharge instructions for Novolog 70/30 twice daily. He discussed . Reviewed new medication for antibiotic for wound concerns. Will benefit from pharmacy referral for medication management concerns       Monitor and Manage My Blood Sugar-Diabetes Type 2       Timeframe:  Long-Range Goal Priority:  High Start Date:   10/10/20                           Expected End Date:  12/11/20  Follow Up Date :10/17/2020   Barriers: Health Behaviors Knowledge Other; supply needs   - check blood sugar at prescribed times - check blood sugar if I feel it is too high or too low    Why is this important?   Checking your blood sugar at home helps to keep it from getting very high or very low.  Writing the results in a diary or log helps the doctor know how to care for you.  Your blood sugar log should have the time, date and the results.  Also, write down the amount of insulin or other medicine that you take.  Other information, like what you ate, exercise done and how you were feeling, will also be helpful.     Notes: Patient identifies as not having blood sugar meter, agreeable to assistance with follow up on getting meter. Call [facility] patient requesting medication to be delivered, able to leave a message will follow up           Plan Will plan follow up call in the next 3 business days for continued care coordination , follow up with on home health,prescription for blood glucose monitoring and supplies, and new prescriptions antibiotic prescribed, transportation.  [name], RN, BSN  [place] Management Coordinator  [phone]- Mobile [phone]- Toll Free Main Office

## 2020-10-11 ENCOUNTER — Telehealth: Payer: Self-pay

## 2020-10-11 ENCOUNTER — Other Ambulatory Visit: Payer: Self-pay | Admitting: *Deleted

## 2020-10-11 DIAGNOSIS — Z79899 Other long term (current) drug therapy: Secondary | ICD-10-CM

## 2020-10-11 NOTE — Progress Notes (Signed)
[facility])  [place] Team    10/11/2020  ESTABAN [name] 04/16/1965 [id number]  Reason for referral: Medication Review, Medication Management  Referral source: [name], RN, [facility] Care Management Current insurance: [name] Medicare and Medicaid  PMHx includes but not limited to:  uncontrolled type 2 diabetes (A1c 13.9%), HIV, CKD 4, CVA, chronic pain, chronic ulcer left leg, s/p right BKA, HTN, HLD, CAD s/p stent.  Outreach:  Unsuccessful telephone call with Keith [name].  HIPAA identifiers were not verified as patient could not be reached.   Subjective:  Keith [name] has had multiple hospitalizations/ED visits this year.  [name] received a medication review/management referral for this patient. Unfortunately, he could not be reached today; all three numbers on file were attempted. Of note, he has an appointment with Dionisio David, NP next week. Per notes, patient may need a glucometer an supplies.   Objective: The ASCVD Risk score Mikey Bussing DC Jr., et al., 2013) failed to calculate for the following reasons:   The patient has a prior MI or stroke diagnosis  Lab Results  Component Value Date   CREATININE 2.37 (H) 10/07/2020   CREATININE 2.26 (H) 10/01/2020   CREATININE 1.85 (H) 09/30/2020    Lab Results  Component Value Date   HGBA1C 13.9 (H) 08/29/2020    Lipid Panel     Component Value Date/Time   CHOL 280 (H) 04/25/2020 2030   CHOL 274 (H) 09/07/2018 1056   TRIG 217 (H) 04/25/2020 2030   HDL 32 (L) 04/25/2020 2030   HDL 29 (L) 09/07/2018 1056   CHOLHDL 8.8 04/25/2020 2030   VLDL 43 (H) 04/25/2020 2030   LDLCALC 205 (H) 04/25/2020 2030   LDLCALC 194 (H) 09/07/2018 1056    BP Readings from Last 3 Encounters:  10/08/20 (!) 158/89  10/01/20 (!) 144/73  09/01/20 135/80    Allergies  Allergen Reactions   Elemental Sulfur Itching    Patient stated he's allergic to "sulfur" AND "sulfa"   Sulfa Antibiotics Itching    Medications  Reviewed Today     Reviewed by Otilio Connors, CPhT (Pharmacy Technician) on 10/07/20 at [place] List Status: Follow Up Visit   Medication Order Taking? Sig Documenting Provider Last Dose Status Informant  acetaminophen (TYLENOL) 325 MG tablet [id number]  Take 2 tablets (650 mg total) by mouth every 4 (four) hours as needed for mild pain, fever or headache. Debbe Odea, MD  Active Self  allopurinol (ZYLOPRIM) 300 MG tablet [id number]  Take 300 mg by mouth daily. [provider]  Active Self  ALPRAZolam [name]) 1 MG tablet [id number]  Take 1 mg by mouth daily as needed for anxiety. [provider]  Active Self  amLODipine (NORVASC) 10 MG tablet [id number]  Take 1 tablet (10 mg total) by mouth daily. [name], MD  Active Self  aspirin 81 MG EC tablet [id number]  Take 1 tablet (81 mg total) by mouth daily. [name], MD  Active Self  atorvastatin (LIPITOR) 80 MG tablet [id number]  Take 1 tablet (80 mg total) by mouth daily. [name], MD  Active Self  bictegravir-emtricitabine-tenofovir AF (BIKTARVY) 50-200-25 MG TABS tablet [id number]  Take 1 tablet by mouth daily. [name], MD  Active Self  blood glucose meter kit and supplies [id number]  Dispense based on patient and insurance preference. Use up to four times daily as directed. (FOR ICD-10 E10.9, E11.9). [name], FNP  Active Self  carvedilol (COREG) 25 MG  tablet [id number]  Take 1 tablet (25 mg total) by mouth 2 (two) times daily with a meal. [name], MD  Active Self  dapsone 100 MG tablet [id number]  TAKE 1 TABLET (100 MG TOTAL) BY MOUTH DAILY. [name], Ava, DO  Active Self  escitalopram (LEXAPRO) 20 MG tablet [id number]  Take 20 mg by mouth daily. [provider]  Active Self  gabapentin (NEURONTIN) 100 MG capsule [id number]  Take 1 capsule (100 mg total) by mouth 3 (three) times daily. [name], MD  Active Self  glucose blood (COOL BLOOD GLUCOSE TEST STRIPS) test strip [id number]  Use  as instructed [name], FNP  Active Self  icosapent Ethyl (VASCEPA) 1 g capsule [id number]  TAKE 2 CAPSULES (2 G TOTAL) BY MOUTH TWO TIMES DAILY. [name], MD  Active Self  insulin aspart protamine- aspart (NOVOLOG MIX 70/30) (70-30) 100 UNIT/ML injection [id number]  Inject 0.3 mLs (30 Units total) into the skin 2 (two) times daily with a meal. With breakfast and dinner. Enzo Bi, MD  Active   Lancets [facility] ULTRASOFT) lancets [id number]  Use as instructed [name], FNP  Active Self  lisinopril (ZESTRIL) 5 MG tablet [id number]  Hold due to worsening kidney function. Enzo Bi, MD  Active   metFORMIN (GLUCOPHAGE) 1000 MG tablet [id number]  Take 1,000 mg by mouth 2 (two) times daily. [provider]  Active Self  nitroGLYCERIN (NITROSTAT) 0.4 MG SL tablet [id number]  PLACE 1 TABLET (0.4 MG TOTAL) UNDER THE TONGUE EVERY FIVE MINUTES X 3 DOSES AS NEEDED FOR CHEST PAIN. [name], Ava, DO  Active Self  NOVOLOG MIX 70/30 FLEXPEN (70-30) 100 UNIT/ML FlexPen [id number]  Inject 30 Units into the skin 2 (two) times daily. [provider]  Active   omega-3 acid ethyl esters (LOVAZA) 1 g capsule [id number]  Take 1 g by mouth 2 (two) times daily. [provider]  Active Self  oxybutynin (DITROPAN) 5 MG tablet [id number]  TAKE 1 TABLET (5 MG TOTAL) BY MOUTH TWO TIMES DAILY. [name], MD  Active Self  oxyCODONE 10 MG TABS [id number]  Take 1 tablet (10 mg total) by mouth every 6 (six) hours as needed for severe pain (Severe pain.). Debbe Odea, MD  Active Self  traZODone (DESYREL) 100 MG tablet [id number]  Take 100 mg by mouth at bedtime. [provider]  Active Self            Assessment/Plan:  Patient does not have an established PCP at this time. Prior to appointment next, he could benefit from a medication review from pharmacist as there are medications that may need dose adjustments/discontinuation due to renal function or duplication of  therapy. Will attempt another patient outreach tomorrow.    Thank you for allowing pharmacy to be a part of this patient's care. Kristeen Miss, PharmD Clinical Pharmacist [place] Cell: [phone]

## 2020-10-11 NOTE — Patient Outreach (Signed)
[facility]) Care Management  10/11/2020  RONDO KOEL May 15, 1965 [name]  Referral for medication assistance from [name], RN sent to [place].   [facility] Management Assistant [phone]

## 2020-10-11 NOTE — Patient Outreach (Addendum)
[facility]) Care Management  10/11/2020  Keith [name] 1965-07-19 [name]   Care Coordination    Outreach call to [facility] ( as noted in [facility] note [facility] sent referral to [facility]).   Keith [name] [phone]),  Spoke with representative that states that they are awaiting referral fax from [facility]. Keith [name] will run insurance and make contact me with afterwards, provided contact number.  Update; Received return call from Keith [name] from [place] home [facility] , she reports [facility] not having a referral on patient to send to them, she is asking [facility] to research referrals based on date of referral as noted in [facility] note. She will return call me and if no referral she agrees to look at case and that referral can be sent to her personal fax # provided.   Received update from [employer] home health she shares that she has received referral information from [facility] and will begin insurance process. She reports that she will update me with start of care date. Confirmed that she has patient mother contact number also if unable to contact patient .       Placed call to [facility] to follow up on patient discharge prescription for doxycycline. Spoke with pharmacy representative,explained patient requesting medication be delivery to patient home on today. Discussed patient need for blood sugar monitor  with strips and lancet, pharmacist states that patient was provided one touch meter November of 2021, he would need new prescription for one touch ultra strips/lancet. If patient does not have meter, out of pocket cost would be $32 as insurance pays for meter one every 2 years.  Encouraged patient to anticipate phone call from home health agency, pharmacy and regarding transportation. Provided patient with contact number for medicaid transportation, he states that he has placed number in his phone.    Returned call to patient to update  regarding home health agency Keith [name] will be contacting him. Explained making contact with [facility] as he requested assistance regarding delivery of medications, Pharmacy will be able to delivery antibiotic on today. Discussed with patient regarding blood sugar meter he is agreeable to device order and supplies being sent to [facility] for delivery, he states again that he does not have cbg meter.   Placed call to [facility] patient center to request order for blood sugar meter strips and lancets be sent to [facility] per patient request so that it can be delivered.     Plan Will plan follow up call on tomorrow to verify transportation arranged to appointment with [name] on 10/13/20.   Keith Draft, RN, BSN  [place] Management Coordinator  [phone]- Mobile [phone]- Toll Free Main Office

## 2020-10-11 NOTE — Telephone Encounter (Signed)
Monitor Blood sugar Strips Lancets  [facility]

## 2020-10-12 ENCOUNTER — Telehealth: Payer: Self-pay

## 2020-10-12 ENCOUNTER — Other Ambulatory Visit: Payer: Self-pay

## 2020-10-12 ENCOUNTER — Other Ambulatory Visit: Payer: Self-pay | Admitting: *Deleted

## 2020-10-12 DIAGNOSIS — R7309 Other abnormal glucose: Secondary | ICD-10-CM

## 2020-10-12 DIAGNOSIS — E1165 Type 2 diabetes mellitus with hyperglycemia: Secondary | ICD-10-CM

## 2020-10-12 DIAGNOSIS — Z794 Long term (current) use of insulin: Secondary | ICD-10-CM

## 2020-10-12 DIAGNOSIS — E1169 Type 2 diabetes mellitus with other specified complication: Secondary | ICD-10-CM

## 2020-10-12 MED ORDER — BLOOD GLUCOSE METER KIT: PACK | 0 refills | Status: DC

## 2020-10-12 NOTE — Telephone Encounter (Signed)
Rx sent 

## 2020-10-12 NOTE — Telephone Encounter (Signed)
   ELICEO MOGUL DOB: [birth year] MRN: [name]   RIDER WAIVER AND RELEASE OF LIABILITY  For purposes of improving physical access to our facilities, [place] is pleased to partner with third parties to provide [place] patients or other authorized individuals the option of convenient, on-demand ground transportation services (the [place]") through use of the technology service that enables users to request on-demand ground transportation from independent third-party providers.  By opting to use and accept these [employer], I, the undersigned, hereby agree on behalf of myself, and on behalf of any minor child using the Government social research officer for whom I am the parent or legal guardian, as follows:  Government social research officer provided to me are provided by independent third-party transportation providers who are not [employer] or employees and who are unaffiliated with [employer]. [place] is neither a transportation carrier nor a common or public carrier. [place] has no control over the quality or safety of the transportation that occurs as a result of the [employer]. [place] cannot guarantee that any third-party transportation provider will complete any arranged transportation service. [place] makes no representation, warranty, or guarantee regarding the reliability, timeliness, quality, safety, suitability, or availability of any of the Transport Services or that they will be error free. I fully understand that traveling by vehicle involves risks and dangers of serious bodily injury, including permanent disability, paralysis, and death. I agree, on behalf of myself and on behalf of any minor child using the Transport Services for whom I am the parent or legal guardian, that the entire risk arising out of my use of the [employer] remains solely with me, to the maximum extent permitted under applicable law. The [employer] are provided "as  is" and "as available." [place] disclaims all representations and warranties, express, implied or statutory, not expressly set out in these terms, including the implied warranties of merchantability and fitness for a particular purpose. I hereby waive and release [place], its agents, employees, officers, directors, representatives, insurers, attorneys, assigns, successors, subsidiaries, and affiliates from any and all past, present, or future claims, demands, liabilities, actions, causes of action, or suits of any kind directly or indirectly arising from acceptance and use of the [employer]. I further waive and release [place] and its affiliates from all present and future liability and responsibility for any injury or death to persons or damages to property caused by or related to the use of the [employer]. I have read this Waiver and Release of Liability, and I understand the terms used in it and their legal significance. This Waiver is freely and voluntarily given with the understanding that my right (as well as the right of any minor child for whom I am the parent or legal guardian using the [employer]) to legal recourse against [place] in connection with the [employer] is knowingly surrendered in return for use of these services.   I attest that I read the consent document to [name], gave Mr. Criner the opportunity to ask questions and answered the questions asked (if any). I affirm that [name] then provided consent for he's participation in this program.     Legrand Pitts

## 2020-10-12 NOTE — Addendum Note (Signed)
Addended by: Thressa Sheller on: 10/12/2020 08:33 AM   Modules accepted: Orders

## 2020-10-12 NOTE — Patient Outreach (Signed)
[facility]) Care Management  10/12/2020  EMMERT NICHTER Jun 09, 1965 [name]   Care Coordination    Incoming call from [facility], [facility] December states patient is scheduled for start of care services on 7/8, she has spoken with patinet. Discussed with her patient has appointment with Dr.Duda at 1:30 she will update RN .  Follow up on orders for blood sugar monitor:Noted [facility], prior request to send to [place] so medication can be delivered. Placed call to [place] states they will reach out to [facility] to have [facility].   Transportation  Placed call to patient ,no answer to patient number placed call to patient mother Alon Manzano phone number able to speak with patient to verify if he has received call from [employer] regarding ride to MD appointment on 7/8, patient states that he has not received a call . Will send email to follow up.  Discussed medicaid transportation  assessment to arrange ride to appointment with PCP on 7/13.   Medication  Patient verified receiving Doxycycline prescription on yesterday and has began taking .Marland Kitchen    Goals Addressed             This Visit's Progress    Find Help in My Community       Timeframe:  Short-Term Goal Priority:  High Start Date:  10/10/20                           Expected End Date: 11/10/20                      Follow Up Date 10/18/2020 Barriers: Health Behaviors Knowledge Support System Transportation     - follow-up on any referrals for help I am given    Why is this important?   Knowing how and where to find help for yourself or family in your neighborhood and community is an important skill.  You will want to take some steps to learn how.    Notes: 7/7 Verified home health service with [facility] in place, start of care 7/8, patient informed . Placed call to [facility] to follow up on home health services,  placed call to [facility] as noted [facility] sent referral to [place], spoke with representative to follow up .       Make and Keep All Appointments       Timeframe:  Long-Range Goal Priority:  Medium Start Date:     10/10/20                        Expected End Date:  12/6520                     Follow Up Date 07/132022   Barriers: Transportation   - arrange a ride through an agency 1 week before appointment - keep a calendar with appointment dates    Why is this important?   Part of staying healthy is seeing the doctor for follow-up care.  If you forget your appointments, there are some things you can do to stay on track.    Notes: 10/12/20 Verified appointment with [name] on 7/8, follow up email to [facility] transportation regarding ride to appointment, requested transportation need on 7/5.  Call to [facility] transportation regarding transportation to PCP visit on 7/13, patient to receive call return call on today  regarding update to transportation assessment , prior to booking ride. Patient verifies having contact number for medicaid transportation date/time and location of appointment .  Reviewed upcoming appointments with PCP on 7/13, and need to rescheduled missed ortho appointment with [name] for follow up on left leg wound .Reviewed transportation need patient reports being unable to drive at this time, placed call to medicaid transportation to arrange transportation. Patient will need follow up with transportation specialist for assessment, before he can arrange ride. Will assist with alternate transportation until established  Will explore [place] transportation services , request made       Manage My Medicine       Timeframe:  Long-Range Goal Priority:  High Start Date:  10/10/20                           Expected End Date:  12/11/20                     Follow Up Date 10/18/2020    - call for medicine refill 2 or 3 days before it runs out - learn to read medicine labels    Why  is this important?   These steps will help you keep on track with your medicines.   Notes:  7/7 - Patient verified having antibiotic and taking as prescribed. Requested CBG meter and [facility] [place] [facility] delivery as patient request. Encouraged to take medications to appointment for review.  Reinforced taking medications as prescribed, reviewed insulin taking patient reads label,  reviewed recent discharge instructions for Novolog 70/30 twice daily. He discussed . Reviewed new medication for antibiotic for wound concerns. Will benefit from pharmacy referral for medication management concerns       Monitor and Manage My Blood Sugar-Diabetes Type 2       Timeframe:  Long-Range Goal Priority:  High Start Date:   10/10/20                          Expected End Date:  12/11/20                    Follow Up Date :10/17/2020   Barriers: Health Behaviors Knowledge Other; supply needs   - check blood sugar at prescribed times - check blood sugar if I feel it is too high or too low    Why is this important?   Checking your blood sugar at home helps to keep it from getting very high or very low.  Writing the results in a diary or log helps the doctor know how to care for you.  Your blood sugar log should have the time, date and the results.  Also, write down the amount of insulin or other medicine that you take.  Other information, like what you ate, exercise done and how you were feeling, will also be helpful.     Notes:  7/7- Call to [facility] to request a [facility] of CBG meter[facility] patient agreeable. Review basic instruction on use of monitor , states that he will be able to use. Reinforced signs symptoms of hyper glycemia, discussed notifying MD for blood sugars greater than 200's.  Patient identifies as not having blood sugar meter, agreeable to assistance with follow up on getting meter. Call to [facility] patient requesting medication to be delivered,  able to leave a message will follow up          Plan Patient agreeable to  plan and follow up call in the next week .  [name], RN, BSN  [place] Management Coordinator  [phone]- Mobile [phone]- Toll Free Main Office

## 2020-10-13 ENCOUNTER — Other Ambulatory Visit: Payer: Self-pay | Admitting: *Deleted

## 2020-10-13 ENCOUNTER — Encounter: Payer: Self-pay | Admitting: Physician Assistant

## 2020-10-13 ENCOUNTER — Telehealth: Payer: Self-pay | Admitting: Physician Assistant

## 2020-10-13 ENCOUNTER — Ambulatory Visit (INDEPENDENT_AMBULATORY_CARE_PROVIDER_SITE_OTHER): Payer: Medicare HMO | Admitting: Physician Assistant

## 2020-10-13 DIAGNOSIS — R079 Chest pain, unspecified: Secondary | ICD-10-CM | POA: Diagnosis not present

## 2020-10-13 DIAGNOSIS — I129 Hypertensive chronic kidney disease with stage 1 through stage 4 chronic kidney disease, or unspecified chronic kidney disease: Secondary | ICD-10-CM | POA: Diagnosis not present

## 2020-10-13 DIAGNOSIS — L97929 Non-pressure chronic ulcer of unspecified part of left lower leg with unspecified severity: Secondary | ICD-10-CM | POA: Diagnosis not present

## 2020-10-13 DIAGNOSIS — R29898 Other symptoms and signs involving the musculoskeletal system: Secondary | ICD-10-CM | POA: Diagnosis not present

## 2020-10-13 DIAGNOSIS — I69398 Other sequelae of cerebral infarction: Secondary | ICD-10-CM | POA: Diagnosis not present

## 2020-10-13 DIAGNOSIS — G9340 Encephalopathy, unspecified: Secondary | ICD-10-CM | POA: Diagnosis not present

## 2020-10-13 DIAGNOSIS — E1165 Type 2 diabetes mellitus with hyperglycemia: Secondary | ICD-10-CM | POA: Diagnosis not present

## 2020-10-13 DIAGNOSIS — I87332 Chronic venous hypertension (idiopathic) with ulcer and inflammation of left lower extremity: Secondary | ICD-10-CM

## 2020-10-13 DIAGNOSIS — N179 Acute kidney failure, unspecified: Secondary | ICD-10-CM | POA: Diagnosis not present

## 2020-10-13 DIAGNOSIS — I2510 Atherosclerotic heart disease of native coronary artery without angina pectoris: Secondary | ICD-10-CM | POA: Diagnosis not present

## 2020-10-13 DIAGNOSIS — N1832 Chronic kidney disease, stage 3b: Secondary | ICD-10-CM | POA: Diagnosis not present

## 2020-10-13 DIAGNOSIS — G8929 Other chronic pain: Secondary | ICD-10-CM | POA: Diagnosis not present

## 2020-10-13 NOTE — Progress Notes (Signed)
Office Visit Note   Patient: Keith [name]           Date of Birth: [birth year]           MRN: [name] Visit Date: 10/13/2020              Requested by: No referring provider defined for this encounter. [name], NP  Chief Complaint  Patient presents with   Left Foot - Wound Check      HPI: Patient is a 56 year old gentleman who presents in follow-up today from the emergency room.  He has seen [place] in the past for chronic venous insufficiency but has been inconsistent with follow-up.  He went to the emergency room on July 2 for a large necrotic ulcer on the bottom of his heel.  He claims that he had a big scab and he pulled it off in the shower.  He also states that he needs to go to a nursing home because he cannot care for himself.  While in the emergency room MRI did not show any fluid collection or osteomyelitis.  He was placed on antibiotics.  He thinks this is helped a little bit.  Assessment & Plan: Visit Diagnoses: No diagnosis found.  Plan: Had a frank discussion with the patient.  Findings today are similar to the emergency room.  He has significant lower extremity swelling.  We will place a Profore wrap and he will follow-up early next week.  If he has any increase in malaise fever chills or pain he is to go to the emergency room.  He thinks he might want to go to the emergency room because he just cannot take care of this himself.  I supported him if he wished to do this.  He will continue on oral antibiotics should refrain from weightbearing  Follow-Up Instructions: No follow-ups on file.   Ortho Exam  Patient is alert, oriented, no adenopathy, well-dressed, normal affect, normal respiratory effort. Has greatly elevated A1c of 13.9.  Large necrotic ulcer on the plantar surface of his heel.  Given delamination on the rest of his foot.  Significant venous stasis swelling on his leg.  No frank open ulcers but some skin deterioration at the proximal lateral  tibia.  No ascending cellulitis.  Does have a triphasic pulses by Doppler  Imaging: No results found. No images are attached to the encounter.  Labs: Lab Results  Component Value Date   HGBA1C 13.9 (H) 08/29/2020   HGBA1C 10.6 (H) 05/19/2020   HGBA1C >15.5 (H) 03/24/2020   ESRSEDRATE 85 (H) 10/07/2020   ESRSEDRATE 130 (H) 11/02/2018   ESRSEDRATE 140 (H) 10/30/2018   CRP 6.7 (H) 10/07/2020   CRP <0.8 11/02/2018   CRP 2.0 (H) 10/30/2018   LABURIC 6.3 08/07/2009   REPTSTATUS 09/30/2020 FINAL 09/29/2020   GRAMSTAIN  04/29/2017    NO WBC SEEN RARE GRAM POSITIVE COCCI IN PAIRS IN SINGLES    CULT MULTIPLE SPECIES PRESENT, SUGGEST RECOLLECTION (A) 09/29/2020   LABORGA METHICILLIN RESISTANT STAPHYLOCOCCUS AUREUS 04/29/2017     Lab Results  Component Value Date   ALBUMIN 3.1 (L) 10/07/2020   ALBUMIN 3.1 (L) 09/29/2020   ALBUMIN 3.3 (L) 04/25/2020   PREALBUMIN 25.5 06/06/2018   PREALBUMIN 7.4 (L) 03/15/2017    Lab Results  Component Value Date   MG 1.9 10/01/2020   MG 2.1 04/28/2020   MG 2.0 04/27/2020   Lab Results  Component Value Date   VD25OH 8.2 (L) 09/07/2018  Lab Results  Component Value Date   PREALBUMIN 25.5 06/06/2018   PREALBUMIN 7.4 (L) 03/15/2017   CBC EXTENDED Latest Ref Rng & Units 10/07/2020 10/01/2020 09/30/2020  WBC 4.0 - 10.5 K/uL 10.4 6.5 8.0  RBC 4.22 - 5.81 MIL/uL 4.31 4.05(L) 4.38  HGB 13.0 - 17.0 g/dL 11.7(L) 11.1(L) 12.1(L)  HCT 39.0 - 52.0 % 35.9(L) 33.3(L) 35.4(L)  PLT 150 - 400 K/uL 267 235 241  NEUTROABS 1.7 - 7.7 K/uL 8.0(H) - 5.6  LYMPHSABS 0.7 - 4.0 K/uL 1.4 - 1.7     There is no height or weight on file to calculate BMI.  Orders:  No orders of the defined types were placed in this encounter.  No orders of the defined types were placed in this encounter.    Procedures: No procedures performed  Clinical Data: No additional findings.  ROS:  All other systems negative, except as noted in the HPI. Review of  Systems  Objective: Vital Signs: There were no vitals taken for this visit.  Specialty Comments:  No specialty comments available.  PMFS History: Patient Active Problem List   Diagnosis Date Noted   Atypical chest pain 123XX123   Acute metabolic encephalopathy 123XX123   Hyperglycemia due to diabetes mellitus ([place]) 08/29/2020   Hyperglycemia 08/29/2020   Acute cerebrovascular accident (CVA) due to ischemia ([place]) 04/28/2020   Type 2 diabetes mellitus with hyperlipidemia (HCC)    PAD (peripheral artery disease) ([place])    Hypoglycemia 04/25/2020   Acute ST elevation myocardial infarction (STEMI) due to occlusion of circumflex coronary artery ([place]) 03/22/2020   Uncontrolled type 2 diabetes mellitus with hyperglycemia ([place]) 01/14/2019   Elevated glucose 01/14/2019   Hx of BKA, right ([place]) 01/14/2019   Pressure injury of skin 11/11/2018   STEMI (ST elevation myocardial infarction) ([place]) 11/10/2018   CKD (chronic kidney disease) stage 4, GFR 15-29 ml/min ([place]) 11/10/2018   Amputation stump infection ([place]) 10/28/2018   Type II diabetes mellitus with renal manifestations ([place]) 08/07/2018   Uncontrolled type 2 diabetes mellitus with hyperosmolar nonketotic hyperglycemia ([place]) 08/07/2018   Non-pressure chronic ulcer of right calf limited to breakdown of skin ([place]) 07/06/2018   Type 2 diabetes mellitus without complication, without long-term current use of insulin ([place]) 06/15/2018   Chronic low back pain without sciatica 06/15/2018   Idiopathic chronic venous hypertension of left lower extremity with ulcer and inflammation (HCC)    Venous stasis ulcer of left calf limited to breakdown of skin without varicose veins ([place]) 04/23/2018   Acquired absence of right leg below knee ([place]) 06/13/2017   Acute kidney injury superimposed on CKD ([place]) 03/15/2017   Diabetic polyneuropathy associated with type 2 diabetes mellitus ([place]) 01/23/2017   AKI (acute kidney injury) ([place]) 09/28/2016   Nausea  vomiting and diarrhea 09/28/2016   Onychomycosis of multiple toenails with type 2 diabetes mellitus ([place]) 08/29/2015   MRSA carrier 04/20/2014   Penile wart 02/22/2014   HIV disease ([place])    Insulin-requiring or dependent type II diabetes mellitus ([place]) 02/04/2014   Dental anomaly 11/20/2012   Arthritis of right knee 02/23/2012   Hyperlipidemia 11/10/2011   Hyponatremia 11/10/2011   Chronic pain 08/07/2011   Meralgia paraesthetica 04/23/2011   ERECTILE DYSFUNCTION 08/22/2008   Essential hypertension 05/19/2008   Past Medical History:  Diagnosis Date   Abscess of right foot    abscess/ulcer of R transtibial amputation requiring IV abx   AIDS ([place])    Anemia    CAD (coronary artery disease)  a. MI with stenting of OM1 in 11/2018 with residual disease. b. acute STEMI 03/2020 s/p DES to OM1   Chronic anemia    Chronic knee pain    right   Chronic pain    CKD (chronic kidney disease), stage IV (HCC)    CVA (cerebral vascular accident) ([place]) 04/2020   Diabetes type 2, uncontrolled ([place])    HgA1c 17.6 (04/27/2010)   Diabetic foot ulcer ([place]) 01/2017   right foot   Dilatation of aorta (HCC)    Erectile dysfunction    Genital warts    HIV (human immunodeficiency virus infection) ([place]) 2009   CD4 count 100, VL 13800 (05/01/2010)   Hyperlipidemia    Hypertension    Noncompliance with medication regimen    Osteomyelitis ([place])    h/o hand   Osteomyelitis of metatarsal ([place]) 04/28/2017    Family History  Problem Relation Age of Onset   Hypertension Mother    Arthritis Father    Hypertension Father    Hypertension Brother    Cancer Maternal Grandmother 63       unknown type of cancer   Depression Paternal Grandmother     Past Surgical History:  Procedure Laterality Date   AMPUTATION Right 05/02/2017   Procedure: AMPUTATION TRANSMETARSAL;  Surgeon: [name], MD;  Location: [facility];  Service: Orthopedics;  Laterality: Right;   AMPUTATION Right 06/13/2017   Procedure: RIGHT  BELOW KNEE AMPUTATION;  Surgeon: [name], MD;  Location: [place];  Service: Orthopedics;  Laterality: Right;   BELOW KNEE LEG AMPUTATION Right 06/13/2017   BELOW KNEE LEG AMPUTATION     CORONARY STENT INTERVENTION N/A 11/10/2018   Procedure: CORONARY STENT INTERVENTION;  Surgeon: Leonie Man, MD;  Location: [facility];  Service: Cardiovascular;  Laterality: N/A;   CORONARY/GRAFT ACUTE MI REVASCULARIZATION N/A 03/21/2020   Procedure: Coronary/Graft Acute MI Revascularization;  Surgeon: [name], MD;  Location: [facility];  Service: Cardiovascular;  Laterality: N/A;   HERNIA REPAIR     I & D EXTREMITY Left 08/21/2014   Procedure: INCISION AND DRAINAGE LEFT SMALL FINGER;  Surgeon: Leanora Cover, MD;  Location: [facility];  Service: Orthopedics;  Laterality: Left;   I & D EXTREMITY Right 03/18/2017   Procedure: IRRIGATION AND DEBRIDEMENT EXTREMITY;  Surgeon: [name], MD;  Location: [place];  Service: Orthopedics;  Laterality: Right;   LEFT HEART CATH AND CORONARY ANGIOGRAPHY N/A 11/10/2018   Procedure: LEFT HEART CATH AND CORONARY ANGIOGRAPHY;  Surgeon: Leonie Man, MD;  Location: [place] [facility];  Service: Cardiovascular;  Laterality: N/A;   LEFT HEART CATH AND CORONARY ANGIOGRAPHY N/A 03/21/2020   Procedure: LEFT HEART CATH AND CORONARY ANGIOGRAPHY;  Surgeon: [name], MD;  Location: [place] [facility];  Service: Cardiovascular;  Laterality: N/A;   MINOR IRRIGATION AND DEBRIDEMENT OF WOUND Right 04/22/2014   Procedure: IRRIGATION AND DEBRIDEMENT OF RIGHT NECK ABCESS;  Surgeon: Jerrell Belfast, MD;  Location: [facility];  Service: ENT;  Laterality: Right;   MULTIPLE EXTRACTIONS WITH ALVEOLOPLASTY N/A 01/18/2013   Procedure: MULTIPLE EXTRACION 3, 6, 7, 10, 11, 13, 21, 22, 27, 28, 29, 30 WITH ALVEOLOPLASTY;  Surgeon: Gae Bon, DDS;  Location: [facility];  Service: Oral Surgery;  Laterality: N/A;   SKIN SPLIT GRAFT Right 03/21/2017   Procedure: IRRIGATION AND  DEBRIDEMENT RIGHT FOOT AND APPLY SPLIT THICKNESS SKIN GRAFT AND WOUND VAC;  Surgeon: [name], MD;  Location: [name];  Service: Orthopedics;  Laterality: Right;   TEE WITHOUT CARDIOVERSION N/A 05/02/2017   Procedure: TRANSESOPHAGEAL ECHOCARDIOGRAM (TEE);  Surgeon: Acie Fredrickson Wonda Cheng, MD;  Location: [facility];  Service: Cardiovascular;  Laterality: N/A;  coincidental to orthopedic case   Social History   Occupational History   Occupation: disabled  Tobacco Use   Smoking status: Never   Smokeless tobacco: Never  Vaping Use   Vaping Use: Never used  Substance and Sexual Activity   Alcohol use: Not Currently   Drug use: Not Currently    Comment: OTC ASA   Sexual activity: Yes    Partners: Female

## 2020-10-13 NOTE — Telephone Encounter (Signed)
Called number below and line was busy. Pt was evaluate din the office today and PA sent to ER with large planter foot infected ulcer

## 2020-10-13 NOTE — Telephone Encounter (Signed)
Received call from [place] with [facility] advised patient has a new wound on the bottom of  the heel of his left foot. April asked if patient can be sent  back with new wound care orders to treat this wound? The number to contact April is [phone]

## 2020-10-13 NOTE — Patient Outreach (Signed)
[facility]) Care Management  10/13/2020  Keith [name] 1965-04-29 [name]   Multidisciplinary case discussion   Date of Review: 10/13/20 Reason: Readmission  Insurance: [name]     Medical Information : Keith [name] is a 55 year old male PMHX: includes but not limited to Diabetes type 2, A1c 13.9%, HIV, CKD, CVA, Chronic pain, chronic ulcer left leg, s/p right BKA, HTN, HLD, CAD s/p stent.  Admissions: Patient admitted 5/24- 09/01/20 for Hyperglycemia , not in DKA, persistent ulceration of left heel. He was discharged home with follow up recommendation with PCP, Keith [name] for ongoing wound care. Patient lives at home with his mother. He reports being able to complete ADL's and was driving self to appointments recently   Patient readmitted 6/23-6/26/22 for Uncontrolled type 2 Diabetes, nonketotic glucose 855 on admission.  He was discharged home on 70/30 insulin for simpler regimen. He was ordered home health RN/PT /OT arranged with [facility] .  During hospital stay  noted patient expressed need to go to nursing home to be cared  for [facility] discussed his functional ability with PT eval would not qualify at this time, and reviewed home health services that had been  arranged. Patient planned to drive himself home from hospital per note.  ED visit 10/07/20 Dx: Hyperglycemia glucose 606 , concern for left foot wound bleeding, not in DKA, MRI of foot no osteomyelitis.  Patient was discharged home with new prescription for antibiotic. Home health services , follow up with Keith [name] and PCP.  Care Coordination  Home services with [facility] arranged with start of care for 7/8 Transportation arranged with [facility] transportation for appointment with Keith [name] 7/8. Calls to [facility] transportation updating transportation assessment so that he can use service for transportation.  [facility] pharmacy referral placed due to concern with medication management . Outreach to  [employer] to coordinate delivery of Doxycycline and new order for CBG meter.  Call to PCP office for orders for CBG meter and supplies request to be sent to [facility].    Follow up Recommendations:  Continue assessment of patient desires regarding level of care and prior request for nursing home. Follow up on [place] program. Will update assigned care manager. Scheduled follow up call in the next 3 business days to verify transportation to upcoming PCP appointment.    [name], RN, BSN  [place] Management Coordinator  [phone]- Mobile [phone]- Toll Free Main Office

## 2020-10-13 NOTE — Progress Notes (Addendum)
[facility], [facility])                                            [place] Team    10/13/2020  Keith [name] 1966/02/11 [id number]   Placed follow up call to patient, reached patient at [phone]. Patient identifiers confirmed.   Patient states that he is out refills on all of his maintenance medications, states that he is currently using Novolog 70/30 mix. Reports injecting 30 units twice daily. Patient states that he has no BG meter to check BG, states that [facility] was going to be delivering a BG meter to him today. [facility] Clinical Pharmacist reviewed current medication list with patient and called [facility], there is currently no active prescription for a BG meter nor a scheduled delivery per [facility].  Patient medication refill history reviewed for following medications:  Medication  SIG  Patient Reported Taking  Last Filled  Quantity   Metformin 1000 mg  Take 1 tablet by mouth twice daily  Y 09/05/2020 180  Novolog 70/30 Flexpen  Inject 30 units into the skin 2 times daily with meals  Y 10/01/2020 15 mL   Pen Needles  As directed Y 09/15/2020 100        Hypercholesterolemia       Medication  SIG  Patient Reported Taking  Last Filled  Quantity   Atorvastatin 80 mg  Take 1 tablet by mouth daily  Y 08/30/2020 90  Vascespa 1 gram  Take 1 tablet by mouth twice daily  N 03/27/2020 120  PAD/STEMI      Brilinta 69m  Take 1 tablet by mouth twice daily  Y 09/15/2020 60  Aspirin 81 mg  Take 1 tablet by mouth once daily  Y 06/23/2020 30      Medications Reviewed Today     Reviewed by AOtilio Connors CPhT (Pharmacy Technician) on 10/07/20 at [facility] Status: Follow Up Visit   Medication Order Taking? Sig Documenting Provider Last Dose Status Informant  acetaminophen (TYLENOL) 325 MG tablet [id number] Take 2 tablets (650 mg total) by mouth every 4 (four) hours as needed for mild pain,  fever or headache. RDebbe Odea MD  Active Self  allopurinol (ZYLOPRIM) 300 MG tablet [id number] Take 300 mg by mouth daily. [provider]  Active Self  ALPRAZolam ([name] 1 MG tablet [id number] Take 1 mg by mouth daily as needed for anxiety. [provider]  Active Self  amLODipine (NORVASC) 10 MG tablet [id number] Take 1 tablet (10 mg total) by mouth daily. KAntonieta Pert MD  Active Self  aspirin 81 MG EC tablet [id number] Take 1 tablet (81 mg total) by mouth daily. BLorretta Harp MD  Active Self  atorvastatin (LIPITOR) 80 MG tablet [id number] Take 1 tablet (80 mg total) by mouth daily. KAntonieta Pert MD  Active Self  bictegravir-emtricitabine-tenofovir AF (BIKTARVY) 50-200-25 MG TABS tablet [id number] Take 1 tablet by mouth daily. HCampbell Riches MD  Active Self  blood glucose meter kit and supplies [id number] Dispense based on patient and insurance preference. Use up to four times daily as directed. (FOR ICD-10 E10.9, E11.9). SAzzie Glatter FNP  Active Self  carvedilol (COREG) 25 MG tablet [id number] Take 1 tablet (25 mg total) by mouth 2 (two) times daily  with a meal. [name], MD  Active Self  dapsone 100 MG tablet [id number]  TAKE 1 TABLET (100 MG TOTAL) BY MOUTH DAILY. [name], Ava, DO  Active Self  escitalopram (LEXAPRO) 20 MG tablet [id number]  Take 20 mg by mouth daily. [provider]  Active Self  gabapentin (NEURONTIN) 100 MG capsule [id number]  Take 1 capsule (100 mg total) by mouth 3 (three) times daily. [name], MD  Active Self  glucose blood (COOL BLOOD GLUCOSE TEST STRIPS) test strip [id number]  Use as instructed [name], FNP  Active Self  icosapent Ethyl (VASCEPA) 1 g capsule [id number]  TAKE 2 CAPSULES (2 G TOTAL) BY MOUTH TWO TIMES DAILY. [name], MD  Active Self  insulin aspart protamine- aspart (NOVOLOG MIX 70/30) (70-30) 100 UNIT/ML injection [id number]  Inject 0.3 mLs (30 Units total) into the skin 2 (two) times daily with  a meal. With breakfast and dinner. Enzo Bi, MD  Active   Lancets [facility] ULTRASOFT) lancets [id number]  Use as instructed [name], FNP  Active Self  lisinopril (ZESTRIL) 5 MG tablet [id number]  Hold due to worsening kidney function. Enzo Bi, MD  Active   metFORMIN (GLUCOPHAGE) 1000 MG tablet [id number]  Take 1,000 mg by mouth 2 (two) times daily. [provider]  Active Self  nitroGLYCERIN (NITROSTAT) 0.4 MG SL tablet [id number]  PLACE 1 TABLET (0.4 MG TOTAL) UNDER THE TONGUE EVERY FIVE MINUTES X 3 DOSES AS NEEDED FOR CHEST PAIN. [name], Ava, DO  Active Self  NOVOLOG MIX 70/30 FLEXPEN (70-30) 100 UNIT/ML FlexPen [id number]  Inject 30 Units into the skin 2 (two) times daily. [provider]  Active   omega-3 acid ethyl esters (LOVAZA) 1 g capsule [id number]  Take 1 g by mouth 2 (two) times daily. [provider]  Active Self  oxybutynin (DITROPAN) 5 MG tablet [id number]  TAKE 1 TABLET (5 MG TOTAL) BY MOUTH TWO TIMES DAILY. [name], MD  Active Self  oxyCODONE 10 MG TABS [id number]  Take 1 tablet (10 mg total) by mouth every 6 (six) hours as needed for severe pain (Severe pain.). Debbe Odea, MD  Active Self  traZODone (DESYREL) 100 MG tablet [id number]  Take 100 mg by mouth at bedtime. [provider]  Active Self            Recommendations:   Medications to renally dose adjust due to eGFR 32 mL/min, Scr 2.37 mg/DL on 10/08/2020 Discontinue Lisinopril 5 mg 1 tablet by daily    Discontinue Metformin 1000 mg 1 tablet by mouth twice daily, or consider dose reduction to Metformin 500 mg 1 tablet by mouth twice daily with close renal monitoring for further renal decline   Monitor use of allopurinol 300 mg 1 tablet by mouth daily; consider a dose reduction to 50 mg daily; Gradually increase dose in ?100 mg/day increments every 2 to 4 weeks   Note: last uric acid level in 2011; medication last filled 06/09/2020 90 day supply      2. Place  order for home blood glucose monitor, test strips, and lancets  Patients [facility] plan covers [employer] supplies, including test strips, glucose monitors, solutions, lancets and lancing devices for $0.

## 2020-10-13 NOTE — Patient Outreach (Signed)
[facility]) Care Management  10/13/2020  COBIE RAJKUMAR 25-Sep-1965 [name]   Care Coordination    Subjective; Incoming call from patient,he requested assistance with arranging transportation to follow up appointment with [name] on 7/12. He discussed after visit today they want to see him back in offer sooner.  Patient states that he has not been able to connect with Medicaid transportation to complete assessment yet and they will not be able to arrange transportation in time for his appointments in the next 3 days.  Placed call to [facility] health transportation and arranged transportation to appointments for 7/12 with [name] anticipated ride pick up at 3:35 and for 7/13 with Dionisio David anticipated pick up time 1;05 patient able to repeat back.  He discussed having home health RN visit on this am and plans return visit beginning of next week.    Patient states that he has not received blood sugar meter from [facility] yet, he will call after he hangs up.   Plan Will plan follow up call next week as planned  [name], RN, BSN  [place] Management Coordinator  [phone]- Mobile [phone]- [place]

## 2020-10-16 ENCOUNTER — Telehealth: Payer: Self-pay | Admitting: Orthopedic Surgery

## 2020-10-16 NOTE — Telephone Encounter (Signed)
Tried to call physical therapist at number provided. Both numbers are fax numbers. I then tried to call patient. No answer. Left message that we will see him in the office tomorrow afternoon to remove the wrap.

## 2020-10-16 NOTE — Telephone Encounter (Signed)
Phy. The. calling to verify orders for wound care as he is on their sched for tomorrow. April asked if they can be faxed if possible, the best fax number is [phone] and April (p.t.) phhone number is [phone].

## 2020-10-16 NOTE — Telephone Encounter (Signed)
Patient has a profore wrap and per my note is to come into office this week for revaluation. Didn't order PT

## 2020-10-17 ENCOUNTER — Other Ambulatory Visit: Payer: Self-pay

## 2020-10-17 ENCOUNTER — Encounter (HOSPITAL_COMMUNITY): Payer: Self-pay | Admitting: Emergency Medicine

## 2020-10-17 ENCOUNTER — Emergency Department (HOSPITAL_COMMUNITY): Payer: Medicare HMO

## 2020-10-17 ENCOUNTER — Telehealth: Payer: Self-pay | Admitting: Orthopedic Surgery

## 2020-10-17 ENCOUNTER — Ambulatory Visit (INDEPENDENT_AMBULATORY_CARE_PROVIDER_SITE_OTHER): Payer: Medicare HMO | Admitting: Physician Assistant

## 2020-10-17 ENCOUNTER — Encounter: Payer: Self-pay | Admitting: Physician Assistant

## 2020-10-17 ENCOUNTER — Other Ambulatory Visit: Payer: Self-pay | Admitting: *Deleted

## 2020-10-17 ENCOUNTER — Inpatient Hospital Stay (HOSPITAL_COMMUNITY)
Admission: EM | Admit: 2020-10-17 | Discharge: 2020-12-27 | DRG: 239 | Disposition: A | Discharge status: Home Health | Payer: Medicare HMO | Attending: Family Medicine | Admitting: Family Medicine

## 2020-10-17 DIAGNOSIS — F419 Anxiety disorder, unspecified: Secondary | ICD-10-CM | POA: Diagnosis present

## 2020-10-17 DIAGNOSIS — R739 Hyperglycemia, unspecified: Secondary | ICD-10-CM

## 2020-10-17 DIAGNOSIS — Z20822 Contact with and (suspected) exposure to covid-19: Secondary | ICD-10-CM | POA: Diagnosis present

## 2020-10-17 DIAGNOSIS — M86172 Other acute osteomyelitis, left ankle and foot: Secondary | ICD-10-CM | POA: Diagnosis present

## 2020-10-17 DIAGNOSIS — Y835 Amputation of limb(s) as the cause of abnormal reaction of the patient, or of later complication, without mention of misadventure at the time of the procedure: Secondary | ICD-10-CM | POA: Diagnosis not present

## 2020-10-17 DIAGNOSIS — R310 Gross hematuria: Secondary | ICD-10-CM

## 2020-10-17 DIAGNOSIS — L97409 Non-pressure chronic ulcer of unspecified heel and midfoot with unspecified severity: Secondary | ICD-10-CM

## 2020-10-17 DIAGNOSIS — Z794 Long term (current) use of insulin: Secondary | ICD-10-CM

## 2020-10-17 DIAGNOSIS — E1169 Type 2 diabetes mellitus with other specified complication: Secondary | ICD-10-CM

## 2020-10-17 DIAGNOSIS — M7989 Other specified soft tissue disorders: Secondary | ICD-10-CM | POA: Diagnosis not present

## 2020-10-17 DIAGNOSIS — Z7902 Long term (current) use of antithrombotics/antiplatelets: Secondary | ICD-10-CM

## 2020-10-17 DIAGNOSIS — L97429 Non-pressure chronic ulcer of left heel and midfoot with unspecified severity: Secondary | ICD-10-CM

## 2020-10-17 DIAGNOSIS — G8929 Other chronic pain: Secondary | ICD-10-CM | POA: Diagnosis present

## 2020-10-17 DIAGNOSIS — N1832 Chronic kidney disease, stage 3b: Secondary | ICD-10-CM | POA: Diagnosis present

## 2020-10-17 DIAGNOSIS — R7309 Other abnormal glucose: Secondary | ICD-10-CM

## 2020-10-17 DIAGNOSIS — I2510 Atherosclerotic heart disease of native coronary artery without angina pectoris: Secondary | ICD-10-CM | POA: Diagnosis present

## 2020-10-17 DIAGNOSIS — E1165 Type 2 diabetes mellitus with hyperglycemia: Secondary | ICD-10-CM | POA: Diagnosis present

## 2020-10-17 DIAGNOSIS — Z87828 Personal history of other (healed) physical injury and trauma: Secondary | ICD-10-CM | POA: Diagnosis not present

## 2020-10-17 DIAGNOSIS — Z89511 Acquired absence of right leg below knee: Secondary | ICD-10-CM

## 2020-10-17 DIAGNOSIS — L0390 Cellulitis, unspecified: Secondary | ICD-10-CM | POA: Diagnosis present

## 2020-10-17 DIAGNOSIS — T8383XA Hemorrhage of genitourinary prosthetic devices, implants and grafts, initial encounter: Secondary | ICD-10-CM | POA: Diagnosis present

## 2020-10-17 DIAGNOSIS — T8130XA Disruption of wound, unspecified, initial encounter: Secondary | ICD-10-CM | POA: Diagnosis not present

## 2020-10-17 DIAGNOSIS — Z23 Encounter for immunization: Secondary | ICD-10-CM | POA: Diagnosis not present

## 2020-10-17 DIAGNOSIS — T8781 Dehiscence of amputation stump: Secondary | ICD-10-CM | POA: Diagnosis not present

## 2020-10-17 DIAGNOSIS — E1122 Type 2 diabetes mellitus with diabetic chronic kidney disease: Secondary | ICD-10-CM | POA: Diagnosis present

## 2020-10-17 DIAGNOSIS — N170 Acute kidney failure with tubular necrosis: Secondary | ICD-10-CM | POA: Diagnosis present

## 2020-10-17 DIAGNOSIS — E13621 Other specified diabetes mellitus with foot ulcer: Secondary | ICD-10-CM

## 2020-10-17 DIAGNOSIS — F32A Depression, unspecified: Secondary | ICD-10-CM | POA: Diagnosis present

## 2020-10-17 DIAGNOSIS — E1152 Type 2 diabetes mellitus with diabetic peripheral angiopathy with gangrene: Secondary | ICD-10-CM | POA: Diagnosis not present

## 2020-10-17 DIAGNOSIS — I10 Essential (primary) hypertension: Secondary | ICD-10-CM

## 2020-10-17 DIAGNOSIS — I129 Hypertensive chronic kidney disease with stage 1 through stage 4 chronic kidney disease, or unspecified chronic kidney disease: Secondary | ICD-10-CM | POA: Diagnosis present

## 2020-10-17 DIAGNOSIS — Z8673 Personal history of transient ischemic attack (TIA), and cerebral infarction without residual deficits: Secondary | ICD-10-CM

## 2020-10-17 DIAGNOSIS — Z882 Allergy status to sulfonamides status: Secondary | ICD-10-CM

## 2020-10-17 DIAGNOSIS — Z9114 Patient's other noncompliance with medication regimen: Secondary | ICD-10-CM

## 2020-10-17 DIAGNOSIS — L97211 Non-pressure chronic ulcer of right calf limited to breakdown of skin: Secondary | ICD-10-CM

## 2020-10-17 DIAGNOSIS — B20 Human immunodeficiency virus [HIV] disease: Secondary | ICD-10-CM | POA: Diagnosis present

## 2020-10-17 DIAGNOSIS — Z89611 Acquired absence of right leg above knee: Secondary | ICD-10-CM

## 2020-10-17 DIAGNOSIS — L97529 Non-pressure chronic ulcer of other part of left foot with unspecified severity: Secondary | ICD-10-CM | POA: Diagnosis present

## 2020-10-17 DIAGNOSIS — L03116 Cellulitis of left lower limb: Secondary | ICD-10-CM | POA: Diagnosis present

## 2020-10-17 DIAGNOSIS — D649 Anemia, unspecified: Secondary | ICD-10-CM

## 2020-10-17 DIAGNOSIS — L97509 Non-pressure chronic ulcer of other part of unspecified foot with unspecified severity: Secondary | ICD-10-CM | POA: Diagnosis not present

## 2020-10-17 DIAGNOSIS — I252 Old myocardial infarction: Secondary | ICD-10-CM

## 2020-10-17 DIAGNOSIS — Z8249 Family history of ischemic heart disease and other diseases of the circulatory system: Secondary | ICD-10-CM

## 2020-10-17 DIAGNOSIS — E871 Hypo-osmolality and hyponatremia: Secondary | ICD-10-CM | POA: Diagnosis not present

## 2020-10-17 DIAGNOSIS — N138 Other obstructive and reflux uropathy: Secondary | ICD-10-CM | POA: Diagnosis not present

## 2020-10-17 DIAGNOSIS — N289 Disorder of kidney and ureter, unspecified: Secondary | ICD-10-CM

## 2020-10-17 DIAGNOSIS — I959 Hypotension, unspecified: Secondary | ICD-10-CM | POA: Diagnosis not present

## 2020-10-17 DIAGNOSIS — E872 Acidosis: Secondary | ICD-10-CM | POA: Diagnosis not present

## 2020-10-17 DIAGNOSIS — R269 Unspecified abnormalities of gait and mobility: Secondary | ICD-10-CM | POA: Diagnosis present

## 2020-10-17 DIAGNOSIS — R8281 Pyuria: Secondary | ICD-10-CM | POA: Diagnosis not present

## 2020-10-17 DIAGNOSIS — D638 Anemia in other chronic diseases classified elsewhere: Secondary | ICD-10-CM | POA: Diagnosis present

## 2020-10-17 DIAGNOSIS — E875 Hyperkalemia: Secondary | ICD-10-CM | POA: Diagnosis not present

## 2020-10-17 DIAGNOSIS — N320 Bladder-neck obstruction: Secondary | ICD-10-CM | POA: Diagnosis not present

## 2020-10-17 DIAGNOSIS — Z809 Family history of malignant neoplasm, unspecified: Secondary | ICD-10-CM

## 2020-10-17 DIAGNOSIS — Z8614 Personal history of Methicillin resistant Staphylococcus aureus infection: Secondary | ICD-10-CM

## 2020-10-17 DIAGNOSIS — Z8261 Family history of arthritis: Secondary | ICD-10-CM

## 2020-10-17 DIAGNOSIS — Z818 Family history of other mental and behavioral disorders: Secondary | ICD-10-CM

## 2020-10-17 DIAGNOSIS — D689 Coagulation defect, unspecified: Secondary | ICD-10-CM | POA: Diagnosis present

## 2020-10-17 DIAGNOSIS — E785 Hyperlipidemia, unspecified: Secondary | ICD-10-CM | POA: Diagnosis present

## 2020-10-17 DIAGNOSIS — E11621 Type 2 diabetes mellitus with foot ulcer: Secondary | ICD-10-CM | POA: Diagnosis present

## 2020-10-17 DIAGNOSIS — T370X5A Adverse effect of sulfonamides, initial encounter: Secondary | ICD-10-CM | POA: Diagnosis present

## 2020-10-17 DIAGNOSIS — Z6841 Body Mass Index (BMI) 40.0 and over, adult: Secondary | ICD-10-CM

## 2020-10-17 DIAGNOSIS — E11649 Type 2 diabetes mellitus with hypoglycemia without coma: Secondary | ICD-10-CM | POA: Diagnosis not present

## 2020-10-17 DIAGNOSIS — E1142 Type 2 diabetes mellitus with diabetic polyneuropathy: Secondary | ICD-10-CM | POA: Diagnosis present

## 2020-10-17 DIAGNOSIS — Z9111 Patient's noncompliance with dietary regimen: Secondary | ICD-10-CM

## 2020-10-17 DIAGNOSIS — E119 Type 2 diabetes mellitus without complications: Secondary | ICD-10-CM | POA: Diagnosis not present

## 2020-10-17 DIAGNOSIS — E669 Obesity, unspecified: Secondary | ICD-10-CM

## 2020-10-17 DIAGNOSIS — Z89512 Acquired absence of left leg below knee: Secondary | ICD-10-CM

## 2020-10-17 DIAGNOSIS — N179 Acute kidney failure, unspecified: Secondary | ICD-10-CM | POA: Diagnosis present

## 2020-10-17 DIAGNOSIS — D62 Acute posthemorrhagic anemia: Secondary | ICD-10-CM | POA: Diagnosis not present

## 2020-10-17 DIAGNOSIS — N529 Male erectile dysfunction, unspecified: Secondary | ICD-10-CM | POA: Diagnosis present

## 2020-10-17 DIAGNOSIS — Z79899 Other long term (current) drug therapy: Secondary | ICD-10-CM

## 2020-10-17 DIAGNOSIS — Z9119 Patient's noncompliance with other medical treatment and regimen: Secondary | ICD-10-CM

## 2020-10-17 DIAGNOSIS — Z955 Presence of coronary angioplasty implant and graft: Secondary | ICD-10-CM

## 2020-10-17 DIAGNOSIS — N401 Enlarged prostate with lower urinary tract symptoms: Secondary | ICD-10-CM | POA: Diagnosis not present

## 2020-10-17 LAB — COMPREHENSIVE METABOLIC PANEL
ALT: 13 U/L (ref 0–44)
AST: 15 U/L (ref 15–41)
Albumin: 2.4 g/dL — ABNORMAL LOW (ref 3.5–5.0)
Alkaline Phosphatase: 85 U/L (ref 38–126)
Anion gap: 10 (ref 5–15)
BUN: 29 mg/dL — ABNORMAL HIGH (ref 6–20)
CO2: 22 mmol/L (ref 22–32)
Calcium: 8.7 mg/dL — ABNORMAL LOW (ref 8.9–10.3)
Chloride: 95 mmol/L — ABNORMAL LOW (ref 98–111)
Creatinine, Ser: 2.19 mg/dL — ABNORMAL HIGH (ref 0.61–1.24)
GFR, Estimated: 35 mL/min — ABNORMAL LOW (ref >60)
Glucose, Bld: 575 mg/dL (ref 70–99)
Potassium: 4.6 mmol/L (ref 3.5–5.1)
Sodium: 127 mmol/L — ABNORMAL LOW (ref 135–145)
Total Bilirubin: 0.7 mg/dL (ref 0.3–1.2)
Total Protein: 7.8 g/dL (ref 6.5–8.1)

## 2020-10-17 LAB — TROPONIN I (HIGH SENSITIVITY): Troponin I (High Sensitivity): 16 ng/L (ref <18)

## 2020-10-17 LAB — CBC WITH DIFFERENTIAL/PLATELET
Abs Immature Granulocytes: 0.06 10*3/uL (ref 0.00–0.07)
Basophils Absolute: 0 10*3/uL (ref 0.0–0.1)
Basophils Relative: 0 %
Eosinophils Absolute: 0.1 10*3/uL (ref 0.0–0.5)
Eosinophils Relative: 1 %
HCT: 34 % — ABNORMAL LOW (ref 39.0–52.0)
Hemoglobin: 10.6 g/dL — ABNORMAL LOW (ref 13.0–17.0)
Immature Granulocytes: 1 %
Lymphocytes Relative: 17 %
Lymphs Abs: 1.5 10*3/uL (ref 0.7–4.0)
MCH: 26.5 pg (ref 26.0–34.0)
MCHC: 31.2 g/dL (ref 30.0–36.0)
MCV: 85 fL (ref 80.0–100.0)
Monocytes Absolute: 0.7 10*3/uL (ref 0.1–1.0)
Monocytes Relative: 8 %
Neutro Abs: 6.4 10*3/uL (ref 1.7–7.7)
Neutrophils Relative %: 73 %
Platelets: 316 10*3/uL (ref 150–400)
RBC: 4 MIL/uL — ABNORMAL LOW (ref 4.22–5.81)
RDW: 12.7 % (ref 11.5–15.5)
WBC: 8.7 10*3/uL (ref 4.0–10.5)
nRBC: 0 % (ref 0.0–0.2)

## 2020-10-17 LAB — LACTIC ACID, PLASMA: Lactic Acid, Venous: 2 mmol/L (ref 0.5–1.9)

## 2020-10-17 MED ORDER — BLOOD GLUCOSE METER KIT: PACK | 0 refills | Status: DC

## 2020-10-17 NOTE — Patient Outreach (Signed)
[name]) Care Management  10/17/2020  Keith [name] 26-May-1965 [name]   Telephone Assessment   Referral received : 09/20/20 Referral source; Insurance Plan Insurance : [name] Admissions 2 in last 2 months.    [name] Admission 6/23-6/29 Dx Hyperglycemia Glucose 123456 , Acute metabolic encephalopathy resolved, chronic venous ulcer left leg, ulcer left heel .   ED visit 10/07/20 Hyperglycemia glucose 554 Ulcer left foot.    PMHX: includes but not limited to Diabetes type 2, A1c 13.9%, HIV, CKD, CVA, Chronic pain, chronic ulcer left leg, s/p right BKA, HTN, HLD, CAD s/p stent.  Subjective: Outreach call to patient, successful call to patient mother phone. Patient states that he is okay, but has a little fever.  He states that home health RN with him and now . Spoke briefly with RN , that plans to notify MD, patient verifies that he is taking his antibiotic.  Patient report not having blood sugar meter yet but anticipating delivery from [name] on today.  I placed call to [name], they  state not received prescription, discussed requesting transfer of prescription from [name] to [name], pharmacist agreed he would follow up.  Returned call to patient, he verifies having [name] and time of pickup for appointment with [name] this afternoon.  Placed call to PCP office spoke [name] to explain need for order for blood sugar meter and supplies be sent to [name]. Per [name] will transfer order to [name] .     Goals Addressed             This Visit's Progress    Find Help in My Community   On track    Timeframe:  Short-Term Goal Priority:  High Start Date:  10/10/20                           Expected End Date: 11/10/20                      Follow Up Date 10/24/2020 Barriers: [name] Behaviors Knowledge Support System [name]     - follow-up on any referrals for help I am [name]     Why is this important?   Knowing how and where to find help for yourself or family in your neighborhood and community is an important skill.  You will want to take some steps to learn how.    Notes: 7/12 Home visit RN visit today with follow to MD regarding wound concerns, follow up call to [name] regarding completing [name] assessment before being able to use [name] service. Patient reports not receiving a call yet. Outreach to [name] with patient , caseworker to contact him for assessment he has contact number if no return call in next 24 to 48 hours.  7/7 Verified home [name] service with [facility] [name] in place, start of care 7/8, patient informed . Placed call to [name] to follow up on home [name] services, placed call to [name] as noted [name] sent referral to [name], spoke with representative to follow up .       Make and Keep All Appointments   On track    Timeframe:  Long-Range Goal Priority:  Medium Start Date:     10/10/20                        Expected End Date:  12/6520                     Follow Up Date 10/24/2020   Barriers: [name]   - arrange a ride through an agency 1 week before appointment - keep a calendar with appointment dates    Why is this important?   Part of staying healthy is seeing the doctor for follow-up care.  If you forget your appointments, there are some things you can do to stay on track.    Notes: 7/12 verified appointments in place for [name] on 7/12 and PCP 7/13 patient able to states dates and times.  10/12/20 Verified appointment with [name] on 7/8, follow up email to [name] regarding ride to appointment, requested [name] need on 7/5.  Call to [name] regarding [name] to PCP visit on 7/13, patient to receive call return call on today regarding update to [name] assessment , prior to booking ride. Patient verifies having  contact number for medicaid [name] date/time and location of appointment .  Reviewed upcoming appointments with PCP on 7/13, and need to rescheduled missed ortho appointment with [name] for follow up on left leg wound .Reviewed [name] need patient reports being unable to drive at this time, placed call to medicaid [name] to arrange [name]. Patient will need follow up with [name] specialist for assessment, before he can arrange ride. Will assist with alternate [name] until established  Will explore [name] services , request made       Manage My Medicine   On track    Timeframe:  Long-Range Goal Priority:  High Start Date:  10/10/20                           Expected End Date:  12/11/20                     Follow Up Date 10/24/2020    - call for medicine refill 2 or 3 days before it runs out - learn to read medicine labels    Why is this important?   These steps will help you keep on track with your medicines.   Notes:  [facility]/12[name] medication review completed.  7/7 - Patient verified having antibiotic and taking as prescribed. Requested CBG meter and supplies order be transferred from [name] to [name] for delivery as patient request. Encouraged to take medications to appointment for review.  Reinforced taking medications as prescribed, reviewed insulin taking patient reads label,  reviewed recent discharge instructions for Novolog 70/30 twice daily. He discussed . Reviewed new medication for antibiotic for wound concerns. Will benefit from [name] referral for medication management concerns       Monitor and Manage My Blood Sugar-Diabetes Type 2   Not on track    Timeframe:  Long-Range Goal Priority:  High Start Date:   10/10/20                          Expected End Date:  12/11/20                    Follow Up Date :10/24/20  Barriers: [name] Behaviors Knowledge Other; supply needs   - check blood sugar at  prescribed times - check blood sugar if I feel it is too high or too low    Why is this important?   Checking your blood  sugar at home helps to keep it from getting very high or very low.  Writing the results in a diary or log helps the doctor know how to care for you.  Your blood sugar log should have the time, date and the results.  Also, write down the amount of insulin or other medicine that you take.  Other information, like what you ate, exercise done and how you were feeling, will also be helpful.     Notes:  7/12. Call to PCP office to have prescription transferred to [name] for delivery. Will send handout use of blood sugar meter  7/7- Call to [name] to request a transfer of CBG meter/supplies prescription from [name] to [facility] for delivery patient agreeable. Review basic instruction on use of monitor , states that he will be able to use. Reinforced signs symptoms of hyper glycemia, discussed notifying MD for blood sugars greater than 200's.  Patient identifies as not having blood sugar meter, agreeable to assistance with follow up on getting meter. Call to [name] patient requesting medication to be delivered, able to leave a message will follow up            Plan Patient agreeable to follow up and plan, will plan outreach follow up call in the next week, and sooner care coordination call to verify receiving CBG meter and use.    [name], RN, BSN  [place] Management Coordinator  [phone]- Mobile [phone]- Toll Free Main Office

## 2020-10-17 NOTE — Progress Notes (Signed)
Office Visit Note   Patient: Keith [name]           Date of Birth: October 21, 1965           MRN: [name] Visit Date: 10/17/2020              Requested by: [name], NP [address] [place],  [place] [address] [name], NP  No chief complaint on file.     HPI: Is a pleasant 55 year old gentleman who follows up today for a large necrotic plantar ulcer on his heel.  He was seen in the emergency room on July 2 for this.  He states it happened when he peeled the scab off while he was in the shower.  He was discharged from the emergency room with precautionary advice that if he had any changes or increase in symptoms or fever he was to return to the emergency room.  I saw him last week and things were very similar to his presentation at the emergency room.  He did have a significant amount of swelling in his leg but no cellulitis.  A compression wrap was placed and he was to follow-up today.  For an unknown reason his home nurse removed the compression wrap.  She has called and reported that he has a temperature of 100.2 this morning.  Currently he is 98 7.  He is having more pain in the heel and some feelings of malaise.  He does say his blood sugars have been okay  Assessment & Plan: Visit Diagnoses: No diagnosis found.  Plan: Concerns for progressing infection in the heel.  With his clinical presentation I have referred him to the emergency room for some IV antibiotics and reevaluation with labs.  I explained to him that [name] would not return till next week but I did not think that he could continue his current path without being further evaluated and more aggressive antibiotics.  Patient is in agreement to this plan as he has not been feeling well.  Patient may very well need formal irrigation and debridement  Follow-Up Instructions: No follow-ups on file.   Ortho Exam  Patient is alert, oriented, no adenopathy, well-dressed, normal affect, normal respiratory  effort. Palpable dorsalis pedis pulse.  Moderate soft tissue swelling in his foot into his leg.  He has bilateral skin breakdown on his calf but no ascending cellulitis.  Examination of the plantar surface of his foot shows increased large hematoma necrotic area.  He has some bogginess to palpation around the heel as well as tenderness.  Skin delamination some of which was debrided.  He has an increase in odor from last week.  Although his MRI did not demonstrate any osteomyelitis he does have a large ulcer below the posterior calcaneus subcu cutaneous edema in the ankle and foot tracking into the toes with possible cellulitis  Imaging: No results found. No images are attached to the encounter.  Labs: Lab Results  Component Value Date   HGBA1C 13.9 (H) 08/29/2020   HGBA1C 10.6 (H) 05/19/2020   HGBA1C >15.5 (H) 03/24/2020   ESRSEDRATE 85 (H) 10/07/2020   ESRSEDRATE 130 (H) 11/02/2018   ESRSEDRATE 140 (H) 10/30/2018   CRP 6.7 (H) 10/07/2020   CRP <0.8 11/02/2018   CRP 2.0 (H) 10/30/2018   LABURIC 6.3 08/07/2009   REPTSTATUS 09/30/2020 FINAL 09/29/2020   GRAMSTAIN  04/29/2017    NO WBC SEEN RARE GRAM POSITIVE COCCI IN PAIRS IN SINGLES  CULT MULTIPLE SPECIES PRESENT, SUGGEST RECOLLECTION (A) 09/29/2020   LABORGA METHICILLIN RESISTANT STAPHYLOCOCCUS AUREUS 04/29/2017     Lab Results  Component Value Date   ALBUMIN 3.1 (L) 10/07/2020   ALBUMIN 3.1 (L) 09/29/2020   ALBUMIN 3.3 (L) 04/25/2020   PREALBUMIN 25.5 06/06/2018   PREALBUMIN 7.4 (L) 03/15/2017    Lab Results  Component Value Date   MG 1.9 10/01/2020   MG 2.1 04/28/2020   MG 2.0 04/27/2020   Lab Results  Component Value Date   VD25OH 8.2 (L) 09/07/2018    Lab Results  Component Value Date   PREALBUMIN 25.5 06/06/2018   PREALBUMIN 7.4 (L) 03/15/2017   CBC EXTENDED Latest Ref Rng & Units 10/07/2020 10/01/2020 09/30/2020  WBC 4.0 - 10.5 K/uL 10.4 6.5 8.0  RBC 4.22 - 5.81 MIL/uL 4.31 4.05(L) 4.38  HGB 13.0 - 17.0  g/dL 11.7(L) 11.1(L) 12.1(L)  HCT 39.0 - 52.0 % 35.9(L) 33.3(L) 35.4(L)  PLT 150 - 400 K/uL 267 235 241  NEUTROABS 1.7 - 7.7 K/uL 8.0(H) - 5.6  LYMPHSABS 0.7 - 4.0 K/uL 1.4 - 1.7     There is no height or weight on file to calculate BMI.  Orders:  No orders of the defined types were placed in this encounter.  No orders of the defined types were placed in this encounter.    Procedures: No procedures performed  Clinical Data: No additional findings.  ROS:  All other systems negative, except as noted in the HPI. Review of Systems  Objective: Vital Signs: There were no vitals taken for this visit.  Specialty Comments:  No specialty comments available.  PMFS History: Patient Active Problem List   Diagnosis Date Noted   Atypical chest pain 123XX123   Acute metabolic encephalopathy 123XX123   Hyperglycemia due to diabetes mellitus ([place]) 08/29/2020   Hyperglycemia 08/29/2020   Acute cerebrovascular accident (CVA) due to ischemia ([place]) 04/28/2020   Type 2 diabetes mellitus with hyperlipidemia (HCC)    PAD (peripheral artery disease) ([place])    Hypoglycemia 04/25/2020   Acute ST elevation myocardial infarction (STEMI) due to occlusion of circumflex coronary artery ([place]) 03/22/2020   Uncontrolled type 2 diabetes mellitus with hyperglycemia ([place]) 01/14/2019   Elevated glucose 01/14/2019   Hx of BKA, right ([place]) 01/14/2019   Pressure injury of skin 11/11/2018   STEMI (ST elevation myocardial infarction) ([place]) 11/10/2018   CKD (chronic kidney disease) stage 4, GFR 15-29 ml/min ([place]) 11/10/2018   Amputation stump infection ([place]) 10/28/2018   Type II diabetes mellitus with renal manifestations ([place]) 08/07/2018   Uncontrolled type 2 diabetes mellitus with hyperosmolar nonketotic hyperglycemia ([place]) 08/07/2018   Non-pressure chronic ulcer of right calf limited to breakdown of skin ([place]) 07/06/2018   Type 2 diabetes mellitus without complication, without long-term current use of  insulin ([place]) 06/15/2018   Chronic low back pain without sciatica 06/15/2018   Idiopathic chronic venous hypertension of left lower extremity with ulcer and inflammation (HCC)    Venous stasis ulcer of left calf limited to breakdown of skin without varicose veins ([place]) 04/23/2018   Acquired absence of right leg below knee ([place]) 06/13/2017   Acute kidney injury superimposed on CKD ([place]) 03/15/2017   Diabetic polyneuropathy associated with type 2 diabetes mellitus ([place]) 01/23/2017   AKI (acute kidney injury) ([place]) 09/28/2016   Nausea vomiting and diarrhea 09/28/2016   Onychomycosis of multiple toenails with type 2 diabetes mellitus ([place]) 08/29/2015   MRSA carrier 04/20/2014   Penile wart 02/22/2014   HIV disease (  [place])    Insulin-requiring or dependent type II diabetes mellitus ([place]) 02/04/2014   Dental anomaly 11/20/2012   Arthritis of right knee 02/23/2012   Hyperlipidemia 11/10/2011   Hyponatremia 11/10/2011   Chronic pain 08/07/2011   Meralgia paraesthetica 04/23/2011   ERECTILE DYSFUNCTION 08/22/2008   Essential hypertension 05/19/2008   Past Medical History:  Diagnosis Date   Abscess of right foot    abscess/ulcer of R transtibial amputation requiring IV abx   AIDS ([place])    Anemia    CAD (coronary artery disease)    a. MI with stenting of OM1 in 11/2018 with residual disease. b. acute STEMI 03/2020 s/p DES to OM1   Chronic anemia    Chronic knee pain    right   Chronic pain    CKD (chronic kidney disease), stage IV (HCC)    CVA (cerebral vascular accident) ([place]) 04/2020   Diabetes type 2, uncontrolled ([place])    HgA1c 17.6 (04/27/2010)   Diabetic foot ulcer ([place]) 01/2017   right foot   Dilatation of aorta (HCC)    Erectile dysfunction    Genital warts    HIV (human immunodeficiency virus infection) ([place]) 2009   CD4 count 100, VL 13800 (05/01/2010)   Hyperlipidemia    Hypertension    Noncompliance with medication regimen    Osteomyelitis ([place])    h/o hand    Osteomyelitis of metatarsal ([place]) 04/28/2017    Family History  Problem Relation Age of Onset   Hypertension Mother    Arthritis Father    Hypertension Father    Hypertension Brother    Cancer Maternal Grandmother 36       unknown type of cancer   Depression Paternal Grandmother     Past Surgical History:  Procedure Laterality Date   AMPUTATION Right 05/02/2017   Procedure: AMPUTATION TRANSMETARSAL;  Surgeon: [name], MD;  Location: [facility];  Service: Orthopedics;  Laterality: Right;   AMPUTATION Right 06/13/2017   Procedure: RIGHT BELOW KNEE AMPUTATION;  Surgeon: [name], MD;  Location: [facility];  Service: Orthopedics;  Laterality: Right;   BELOW KNEE LEG AMPUTATION Right 06/13/2017   BELOW KNEE LEG AMPUTATION     CORONARY STENT INTERVENTION N/A 11/10/2018   Procedure: CORONARY STENT INTERVENTION;  Surgeon: Leonie Man, MD;  Location: [facility];  Service: Cardiovascular;  Laterality: N/A;   CORONARY/GRAFT ACUTE MI REVASCULARIZATION N/A 03/21/2020   Procedure: Coronary/Graft Acute MI Revascularization;  Surgeon: [name], MD;  Location: [facility];  Service: Cardiovascular;  Laterality: N/A;   HERNIA REPAIR     I & D EXTREMITY Left 08/21/2014   Procedure: INCISION AND DRAINAGE LEFT SMALL FINGER;  Surgeon: Leanora Cover, MD;  Location: [facility];  Service: Orthopedics;  Laterality: Left;   I & D EXTREMITY Right 03/18/2017   Procedure: IRRIGATION AND DEBRIDEMENT EXTREMITY;  Surgeon: [name], MD;  Location: [facility];  Service: Orthopedics;  Laterality: Right;   LEFT HEART CATH AND CORONARY ANGIOGRAPHY N/A 11/10/2018   Procedure: LEFT HEART CATH AND CORONARY ANGIOGRAPHY;  Surgeon: Leonie Man, MD;  Location: [facility];  Service: Cardiovascular;  Laterality: N/A;   LEFT HEART CATH AND CORONARY ANGIOGRAPHY N/A 03/21/2020   Procedure: LEFT HEART CATH AND CORONARY ANGIOGRAPHY;  Surgeon: [name], MD;  Location: [facility];  Service:  Cardiovascular;  Laterality: N/A;   MINOR IRRIGATION AND DEBRIDEMENT OF WOUND Right 04/22/2014   Procedure: IRRIGATION AND DEBRIDEMENT OF  RIGHT NECK ABCESS;  Surgeon: Jerrell Belfast, MD;  Location: [facility];  Service: ENT;  Laterality: Right;   MULTIPLE EXTRACTIONS WITH ALVEOLOPLASTY N/A 01/18/2013   Procedure: MULTIPLE EXTRACION 3, 6, 7, 10, 11, 13, 21, 22, 27, 28, 29, 30 WITH ALVEOLOPLASTY;  Surgeon: Gae Bon, DDS;  Location: [facility];  Service: Oral Surgery;  Laterality: N/A;   SKIN SPLIT GRAFT Right 03/21/2017   Procedure: IRRIGATION AND DEBRIDEMENT RIGHT FOOT AND APPLY SPLIT THICKNESS SKIN GRAFT AND WOUND VAC;  Surgeon: [name], MD;  [facility];  Service: Orthopedics;  Laterality: Right;   TEE WITHOUT CARDIOVERSION N/A 05/02/2017   Procedure: TRANSESOPHAGEAL ECHOCARDIOGRAM (TEE);  Surgeon: Acie Fredrickson Wonda Cheng, MD;  Location: [facility];  Service: Cardiovascular;  Laterality: N/A;  coincidental to orthopedic case   Social History   Occupational History   Occupation: disabled  Tobacco Use   Smoking status: Never   Smokeless tobacco: Never  Vaping Use   Vaping Use: Never used  Substance and Sexual Activity   Alcohol use: Not Currently   Drug use: Not Currently    Comment: OTC ASA   Sexual activity: Yes    Partners: Female

## 2020-10-17 NOTE — ED Provider Notes (Signed)
Emergency Medicine Provider Triage Evaluation Note  ALECXIS [name] , a 55 y.o. male  was evaluated in triage.  Pt complains of patient presents with worsening ulcer on his left calcaneus, being followed by [name], there is noted today by the PA that patient is having worsening ulceration of his left heel, they sent me here for further evaluation.  They suspect he will need IV antibiotics, he states that his left foot has been hurting more, has increasing swelling, denies paresthesia or weakness in that foot.  Has no other complaints this time.  Review of Systems  Positive: Left foot pain, left leg swelling Negative: Denies chest pain or shortness of breath  Physical Exam  BP (!) 154/96   Pulse 89   Temp 99.2 F (37.3 C) (Oral)   Resp 18   Ht '5\' 10"'$  (1.778 m)   Wt 127 kg   SpO2 100%   BMI 40.17 kg/m  Gen:   Awake, no distress   Resp:  Normal effort  MSK:   Moves extremities without difficulty  Other:  Patient has a good pedal pulse in the left foot, 2+ pitting edema.  Medical Decision Making  Medically screening exam initiated at 6:07 PM.  Appropriate orders placed.  KENON PAIK was informed that the remainder of the evaluation will be completed by another provider, this initial triage assessment does not replace that evaluation, and the importance of remaining in the ED until their evaluation is complete.  Patient presents with ulcer of his left foot, lab work and imaging have been ordered, patient need further work-up in the emergency department.   Marcello Fennel, PA-C 10/17/20 Keith [name]    Keith Muskrat, MD 10/19/20 1102

## 2020-10-17 NOTE — Telephone Encounter (Signed)
I left a message for April regarding the patient.  Patient was post to be in a Profore wrap so not sure what they are doing for dry dressing changes.  I had significant soreness for the ulcer and the calf at his last visits and was very specific that if he had any changes or fevers he was to go to the emergency room.  I left her this message but certainly I can evaluate him this afternoon.

## 2020-10-17 NOTE — Telephone Encounter (Signed)
I called April with HH. She states that patient is running low grade fever this morning of 100.5. He has increased odor of wound and the drainage has increased since she saw him Friday. She also states that the measurements of the wound are slightly larger than last Friday. She is only able to do dry dressing changes until she gets updated orders. She requests return call with those after patient's appointment this afternoon at 4pm.

## 2020-10-17 NOTE — ED Triage Notes (Signed)
Pt sent by [name] for left foot ulcer and needing IV abx. Ulcer has been there for 3 weeks and worried about infection.

## 2020-10-17 NOTE — Telephone Encounter (Signed)
[facility] called and needs to talk to autumn asap about pt having fever and wound with strong order  CB [phone]

## 2020-10-18 ENCOUNTER — Ambulatory Visit: Payer: Medicare HMO | Admitting: Nurse Practitioner

## 2020-10-18 ENCOUNTER — Inpatient Hospital Stay (HOSPITAL_COMMUNITY): Payer: Medicare HMO

## 2020-10-18 DIAGNOSIS — N183 Chronic kidney disease, stage 3 unspecified: Secondary | ICD-10-CM | POA: Diagnosis not present

## 2020-10-18 DIAGNOSIS — D689 Coagulation defect, unspecified: Secondary | ICD-10-CM | POA: Diagnosis not present

## 2020-10-18 DIAGNOSIS — L97429 Non-pressure chronic ulcer of left heel and midfoot with unspecified severity: Secondary | ICD-10-CM | POA: Diagnosis not present

## 2020-10-18 DIAGNOSIS — D500 Iron deficiency anemia secondary to blood loss (chronic): Secondary | ICD-10-CM | POA: Diagnosis not present

## 2020-10-18 DIAGNOSIS — G8918 Other acute postprocedural pain: Secondary | ICD-10-CM | POA: Diagnosis not present

## 2020-10-18 DIAGNOSIS — Z20822 Contact with and (suspected) exposure to covid-19: Secondary | ICD-10-CM | POA: Diagnosis not present

## 2020-10-18 DIAGNOSIS — E13621 Other specified diabetes mellitus with foot ulcer: Secondary | ICD-10-CM | POA: Diagnosis not present

## 2020-10-18 DIAGNOSIS — E871 Hypo-osmolality and hyponatremia: Secondary | ICD-10-CM | POA: Diagnosis not present

## 2020-10-18 DIAGNOSIS — E11621 Type 2 diabetes mellitus with foot ulcer: Secondary | ICD-10-CM | POA: Diagnosis not present

## 2020-10-18 DIAGNOSIS — L03116 Cellulitis of left lower limb: Secondary | ICD-10-CM | POA: Diagnosis not present

## 2020-10-18 DIAGNOSIS — N4289 Other specified disorders of prostate: Secondary | ICD-10-CM | POA: Diagnosis not present

## 2020-10-18 DIAGNOSIS — M86172 Other acute osteomyelitis, left ankle and foot: Secondary | ICD-10-CM | POA: Diagnosis not present

## 2020-10-18 DIAGNOSIS — N2889 Other specified disorders of kidney and ureter: Secondary | ICD-10-CM | POA: Diagnosis not present

## 2020-10-18 DIAGNOSIS — S301XXA Contusion of abdominal wall, initial encounter: Secondary | ICD-10-CM | POA: Diagnosis not present

## 2020-10-18 DIAGNOSIS — R944 Abnormal results of kidney function studies: Secondary | ICD-10-CM | POA: Diagnosis not present

## 2020-10-18 DIAGNOSIS — R739 Hyperglycemia, unspecified: Secondary | ICD-10-CM | POA: Diagnosis not present

## 2020-10-18 DIAGNOSIS — N1832 Chronic kidney disease, stage 3b: Secondary | ICD-10-CM | POA: Diagnosis not present

## 2020-10-18 DIAGNOSIS — I959 Hypotension, unspecified: Secondary | ICD-10-CM | POA: Diagnosis not present

## 2020-10-18 DIAGNOSIS — N170 Acute kidney failure with tubular necrosis: Secondary | ICD-10-CM | POA: Diagnosis not present

## 2020-10-18 DIAGNOSIS — Z4901 Encounter for fitting and adjustment of extracorporeal dialysis catheter: Secondary | ICD-10-CM | POA: Diagnosis not present

## 2020-10-18 DIAGNOSIS — E1129 Type 2 diabetes mellitus with other diabetic kidney complication: Secondary | ICD-10-CM | POA: Diagnosis not present

## 2020-10-18 DIAGNOSIS — B20 Human immunodeficiency virus [HIV] disease: Secondary | ICD-10-CM | POA: Diagnosis not present

## 2020-10-18 DIAGNOSIS — N289 Disorder of kidney and ureter, unspecified: Secondary | ICD-10-CM | POA: Diagnosis not present

## 2020-10-18 DIAGNOSIS — G9341 Metabolic encephalopathy: Secondary | ICD-10-CM | POA: Diagnosis not present

## 2020-10-18 DIAGNOSIS — N184 Chronic kidney disease, stage 4 (severe): Secondary | ICD-10-CM | POA: Diagnosis not present

## 2020-10-18 DIAGNOSIS — E872 Acidosis: Secondary | ICD-10-CM | POA: Diagnosis not present

## 2020-10-18 DIAGNOSIS — E1122 Type 2 diabetes mellitus with diabetic chronic kidney disease: Secondary | ICD-10-CM | POA: Diagnosis not present

## 2020-10-18 DIAGNOSIS — M7989 Other specified soft tissue disorders: Secondary | ICD-10-CM | POA: Diagnosis not present

## 2020-10-18 DIAGNOSIS — Z6841 Body Mass Index (BMI) 40.0 and over, adult: Secondary | ICD-10-CM | POA: Diagnosis not present

## 2020-10-18 DIAGNOSIS — D62 Acute posthemorrhagic anemia: Secondary | ICD-10-CM | POA: Diagnosis not present

## 2020-10-18 DIAGNOSIS — I129 Hypertensive chronic kidney disease with stage 1 through stage 4 chronic kidney disease, or unspecified chronic kidney disease: Secondary | ICD-10-CM | POA: Diagnosis not present

## 2020-10-18 DIAGNOSIS — E1142 Type 2 diabetes mellitus with diabetic polyneuropathy: Secondary | ICD-10-CM | POA: Diagnosis present

## 2020-10-18 DIAGNOSIS — D638 Anemia in other chronic diseases classified elsewhere: Secondary | ICD-10-CM | POA: Diagnosis not present

## 2020-10-18 DIAGNOSIS — Z89512 Acquired absence of left leg below knee: Secondary | ICD-10-CM | POA: Diagnosis not present

## 2020-10-18 DIAGNOSIS — L97401 Non-pressure chronic ulcer of unspecified heel and midfoot limited to breakdown of skin: Secondary | ICD-10-CM | POA: Diagnosis not present

## 2020-10-18 DIAGNOSIS — F32A Depression, unspecified: Secondary | ICD-10-CM | POA: Diagnosis present

## 2020-10-18 DIAGNOSIS — Z23 Encounter for immunization: Secondary | ICD-10-CM | POA: Diagnosis not present

## 2020-10-18 DIAGNOSIS — L97424 Non-pressure chronic ulcer of left heel and midfoot with necrosis of bone: Secondary | ICD-10-CM | POA: Diagnosis not present

## 2020-10-18 DIAGNOSIS — L0390 Cellulitis, unspecified: Secondary | ICD-10-CM | POA: Insufficient documentation

## 2020-10-18 DIAGNOSIS — T8781 Dehiscence of amputation stump: Secondary | ICD-10-CM | POA: Diagnosis not present

## 2020-10-18 DIAGNOSIS — Z7984 Long term (current) use of oral hypoglycemic drugs: Secondary | ICD-10-CM | POA: Diagnosis not present

## 2020-10-18 DIAGNOSIS — I10 Essential (primary) hypertension: Secondary | ICD-10-CM | POA: Diagnosis not present

## 2020-10-18 DIAGNOSIS — T8383XA Hemorrhage of genitourinary prosthetic devices, implants and grafts, initial encounter: Secondary | ICD-10-CM | POA: Diagnosis present

## 2020-10-18 DIAGNOSIS — R69 Illness, unspecified: Secondary | ICD-10-CM | POA: Diagnosis not present

## 2020-10-18 DIAGNOSIS — N401 Enlarged prostate with lower urinary tract symptoms: Secondary | ICD-10-CM | POA: Diagnosis not present

## 2020-10-18 DIAGNOSIS — E1151 Type 2 diabetes mellitus with diabetic peripheral angiopathy without gangrene: Secondary | ICD-10-CM | POA: Diagnosis not present

## 2020-10-18 DIAGNOSIS — Z794 Long term (current) use of insulin: Secondary | ICD-10-CM | POA: Diagnosis not present

## 2020-10-18 DIAGNOSIS — T8130XA Disruption of wound, unspecified, initial encounter: Secondary | ICD-10-CM | POA: Diagnosis not present

## 2020-10-18 DIAGNOSIS — N179 Acute kidney failure, unspecified: Secondary | ICD-10-CM | POA: Diagnosis not present

## 2020-10-18 DIAGNOSIS — R310 Gross hematuria: Secondary | ICD-10-CM | POA: Diagnosis not present

## 2020-10-18 DIAGNOSIS — E875 Hyperkalemia: Secondary | ICD-10-CM | POA: Diagnosis not present

## 2020-10-18 DIAGNOSIS — M869 Osteomyelitis, unspecified: Secondary | ICD-10-CM | POA: Diagnosis not present

## 2020-10-18 DIAGNOSIS — E08621 Diabetes mellitus due to underlying condition with foot ulcer: Secondary | ICD-10-CM | POA: Diagnosis not present

## 2020-10-18 DIAGNOSIS — R319 Hematuria, unspecified: Secondary | ICD-10-CM | POA: Diagnosis not present

## 2020-10-18 DIAGNOSIS — E1165 Type 2 diabetes mellitus with hyperglycemia: Secondary | ICD-10-CM | POA: Diagnosis not present

## 2020-10-18 DIAGNOSIS — I96 Gangrene, not elsewhere classified: Secondary | ICD-10-CM | POA: Diagnosis not present

## 2020-10-18 DIAGNOSIS — E1169 Type 2 diabetes mellitus with other specified complication: Secondary | ICD-10-CM | POA: Diagnosis not present

## 2020-10-18 DIAGNOSIS — E1110 Type 2 diabetes mellitus with ketoacidosis without coma: Secondary | ICD-10-CM | POA: Diagnosis not present

## 2020-10-18 DIAGNOSIS — E1152 Type 2 diabetes mellitus with diabetic peripheral angiopathy with gangrene: Secondary | ICD-10-CM | POA: Diagnosis not present

## 2020-10-18 DIAGNOSIS — K8020 Calculus of gallbladder without cholecystitis without obstruction: Secondary | ICD-10-CM | POA: Diagnosis not present

## 2020-10-18 DIAGNOSIS — E785 Hyperlipidemia, unspecified: Secondary | ICD-10-CM | POA: Diagnosis not present

## 2020-10-18 DIAGNOSIS — Y835 Amputation of limb(s) as the cause of abnormal reaction of the patient, or of later complication, without mention of misadventure at the time of the procedure: Secondary | ICD-10-CM | POA: Diagnosis not present

## 2020-10-18 DIAGNOSIS — N3289 Other specified disorders of bladder: Secondary | ICD-10-CM | POA: Diagnosis not present

## 2020-10-18 DIAGNOSIS — D649 Anemia, unspecified: Secondary | ICD-10-CM | POA: Diagnosis not present

## 2020-10-18 DIAGNOSIS — L97929 Non-pressure chronic ulcer of unspecified part of left lower leg with unspecified severity: Secondary | ICD-10-CM | POA: Diagnosis not present

## 2020-10-18 DIAGNOSIS — M19072 Primary osteoarthritis, left ankle and foot: Secondary | ICD-10-CM | POA: Diagnosis not present

## 2020-10-18 DIAGNOSIS — T8744 Infection of amputation stump, left lower extremity: Secondary | ICD-10-CM | POA: Diagnosis not present

## 2020-10-18 DIAGNOSIS — Z89511 Acquired absence of right leg below knee: Secondary | ICD-10-CM | POA: Diagnosis not present

## 2020-10-18 DIAGNOSIS — E11649 Type 2 diabetes mellitus with hypoglycemia without coma: Secondary | ICD-10-CM | POA: Diagnosis not present

## 2020-10-18 DIAGNOSIS — M86162 Other acute osteomyelitis, left tibia and fibula: Secondary | ICD-10-CM | POA: Diagnosis not present

## 2020-10-18 LAB — CBC
HCT: 32.3 % — ABNORMAL LOW (ref 39.0–52.0)
Hemoglobin: 10.4 g/dL — ABNORMAL LOW (ref 13.0–17.0)
MCH: 26.6 pg (ref 26.0–34.0)
MCHC: 32.2 g/dL (ref 30.0–36.0)
MCV: 82.6 fL (ref 80.0–100.0)
Platelets: 322 10*3/uL (ref 150–400)
RBC: 3.91 MIL/uL — ABNORMAL LOW (ref 4.22–5.81)
RDW: 12.6 % (ref 11.5–15.5)
WBC: 7.5 10*3/uL (ref 4.0–10.5)
nRBC: 0 % (ref 0.0–0.2)

## 2020-10-18 LAB — CBG MONITORING, ED: Glucose-Capillary: 563 mg/dL (ref 70–99)

## 2020-10-18 LAB — MAGNESIUM: Magnesium: 1.9 mg/dL (ref 1.7–2.4)

## 2020-10-18 LAB — BASIC METABOLIC PANEL
Anion gap: 10 (ref 5–15)
BUN: 25 mg/dL — ABNORMAL HIGH (ref 6–20)
CO2: 21 mmol/L — ABNORMAL LOW (ref 22–32)
Calcium: 8.8 mg/dL — ABNORMAL LOW (ref 8.9–10.3)
Chloride: 98 mmol/L (ref 98–111)
Creatinine, Ser: 1.96 mg/dL — ABNORMAL HIGH (ref 0.61–1.24)
GFR, Estimated: 40 mL/min — ABNORMAL LOW (ref >60)
Glucose, Bld: 427 mg/dL — ABNORMAL HIGH (ref 70–99)
Potassium: 4.5 mmol/L (ref 3.5–5.1)
Sodium: 129 mmol/L — ABNORMAL LOW (ref 135–145)

## 2020-10-18 LAB — GLUCOSE, CAPILLARY
Glucose-Capillary: 444 mg/dL — ABNORMAL HIGH (ref 70–99)
Glucose-Capillary: 405 mg/dL — ABNORMAL HIGH (ref 70–99)
Glucose-Capillary: 375 mg/dL — ABNORMAL HIGH (ref 70–99)
Glucose-Capillary: 179 mg/dL — ABNORMAL HIGH (ref 70–99)
Glucose-Capillary: 160 mg/dL — ABNORMAL HIGH (ref 70–99)

## 2020-10-18 LAB — TROPONIN I (HIGH SENSITIVITY): Troponin I (High Sensitivity): 10 ng/L (ref <18)

## 2020-10-18 LAB — CREATININE, SERUM
Creatinine, Ser: 2.19 mg/dL — ABNORMAL HIGH (ref 0.61–1.24)
GFR, Estimated: 35 mL/min — ABNORMAL LOW (ref >60)

## 2020-10-18 LAB — C-REACTIVE PROTEIN: CRP: 11.2 mg/dL — ABNORMAL HIGH (ref <1.0)

## 2020-10-18 LAB — PHOSPHORUS: Phosphorus: 3.9 mg/dL (ref 2.5–4.6)

## 2020-10-18 LAB — MRSA NEXT GEN BY PCR, NASAL: MRSA by PCR Next Gen: NOT DETECTED

## 2020-10-18 LAB — SEDIMENTATION RATE: Sed Rate: 128 mm/hr — ABNORMAL HIGH (ref 0–16)

## 2020-10-18 LAB — SARS CORONAVIRUS 2 (TAT 6-24 HRS): SARS Coronavirus 2: NEGATIVE

## 2020-10-18 LAB — LACTIC ACID, PLASMA: Lactic Acid, Venous: 1.2 mmol/L (ref 0.5–1.9)

## 2020-10-18 MED ORDER — LACTATED RINGERS IV SOLN: INTRAVENOUS | Status: AC

## 2020-10-18 MED ORDER — HYDROMORPHONE HCL 1 MG/ML IJ SOLN
1.0000 mg | INTRAMUSCULAR | Status: DC | PRN
  Administered 2020-10-18 – 2020-11-16 (×74): 1 mg via INTRAVENOUS
  Filled 2020-10-18 (×75): qty 1

## 2020-10-18 MED ORDER — VANCOMYCIN HCL 2000 MG/400ML IV SOLN
2000.0000 mg | Freq: Once | INTRAVENOUS | Status: AC
  Administered 2020-10-18: 2000 mg via INTRAVENOUS
  Filled 2020-10-18: qty 400

## 2020-10-18 MED ORDER — INSULIN ASPART 100 UNIT/ML IJ SOLN: 0.0000 [IU] | Freq: Three times a day (TID) | INTRAMUSCULAR | Status: DC

## 2020-10-18 MED ORDER — ONDANSETRON HCL 4 MG/2ML IJ SOLN
4.0000 mg | Freq: Once | INTRAMUSCULAR | Status: AC
  Administered 2020-10-18: 4 mg via INTRAVENOUS
  Filled 2020-10-18: qty 2

## 2020-10-18 MED ORDER — BICTEGRAVIR-EMTRICITAB-TENOFOV 50-200-25 MG PO TABS
1.0000 | ORAL_TABLET | Freq: Every day | ORAL | Status: DC
  Administered 2020-10-18 – 2020-11-07 (×20): 1 via ORAL
  Filled 2020-10-18 (×21): qty 1

## 2020-10-18 MED ORDER — INSULIN ASPART 100 UNIT/ML IJ SOLN
0.0000 [IU] | Freq: Three times a day (TID) | INTRAMUSCULAR | Status: DC
  Administered 2020-10-18: 15 [IU] via SUBCUTANEOUS
  Administered 2020-10-18: 3 [IU] via SUBCUTANEOUS
  Administered 2020-10-19: 5 [IU] via SUBCUTANEOUS
  Administered 2020-10-19: 3 [IU] via SUBCUTANEOUS
  Administered 2020-10-19: 5 [IU] via SUBCUTANEOUS
  Administered 2020-10-20: 8 [IU] via SUBCUTANEOUS
  Administered 2020-10-20: 11 [IU] via SUBCUTANEOUS
  Administered 2020-10-20: 3 [IU] via SUBCUTANEOUS
  Administered 2020-10-21 (×2): 5 [IU] via SUBCUTANEOUS
  Administered 2020-10-21: 8 [IU] via SUBCUTANEOUS
  Administered 2020-10-22: 5 [IU] via SUBCUTANEOUS
  Administered 2020-10-22: 8 [IU] via SUBCUTANEOUS
  Administered 2020-10-22 – 2020-10-24 (×6): 5 [IU] via SUBCUTANEOUS
  Administered 2020-10-24: 8 [IU] via SUBCUTANEOUS
  Administered 2020-10-25: 5 [IU] via SUBCUTANEOUS
  Administered 2020-10-25: 3 [IU] via SUBCUTANEOUS
  Administered 2020-10-25: 5 [IU] via SUBCUTANEOUS
  Administered 2020-10-26: 2 [IU] via SUBCUTANEOUS
  Administered 2020-10-26 – 2020-10-27 (×2): 3 [IU] via SUBCUTANEOUS
  Administered 2020-10-28 (×2): 8 [IU] via SUBCUTANEOUS
  Administered 2020-10-28: 5 [IU] via SUBCUTANEOUS
  Administered 2020-10-29: 3 [IU] via SUBCUTANEOUS
  Administered 2020-10-29: 2 [IU] via SUBCUTANEOUS
  Administered 2020-10-29: 5 [IU] via SUBCUTANEOUS
  Administered 2020-10-30 – 2020-10-31 (×2): 2 [IU] via SUBCUTANEOUS
  Administered 2020-10-31: 3 [IU] via SUBCUTANEOUS
  Administered 2020-11-01 (×2): 2 [IU] via SUBCUTANEOUS
  Administered 2020-11-04 – 2020-11-06 (×5): 3 [IU] via SUBCUTANEOUS
  Administered 2020-11-06 (×2): 2 [IU] via SUBCUTANEOUS
  Administered 2020-11-07 (×3): 3 [IU] via SUBCUTANEOUS
  Administered 2020-11-08 (×2): 5 [IU] via SUBCUTANEOUS
  Administered 2020-11-08: 3 [IU] via SUBCUTANEOUS
  Administered 2020-11-09: 2 [IU] via SUBCUTANEOUS
  Administered 2020-11-09 (×2): 3 [IU] via SUBCUTANEOUS
  Administered 2020-11-10: 2 [IU] via SUBCUTANEOUS
  Administered 2020-11-10: 3 [IU] via SUBCUTANEOUS
  Administered 2020-11-11 (×2): 2 [IU] via SUBCUTANEOUS
  Administered 2020-11-11 – 2020-11-14 (×7): 3 [IU] via SUBCUTANEOUS
  Administered 2020-11-14: 2 [IU] via SUBCUTANEOUS
  Administered 2020-11-14: 5 [IU] via SUBCUTANEOUS
  Administered 2020-11-15: 3 [IU] via SUBCUTANEOUS
  Administered 2020-11-15: 2 [IU] via SUBCUTANEOUS
  Administered 2020-11-15: 3 [IU] via SUBCUTANEOUS
  Administered 2020-11-16 (×2): 2 [IU] via SUBCUTANEOUS
  Administered 2020-11-16: 3 [IU] via SUBCUTANEOUS
  Administered 2020-11-18: 5 [IU] via SUBCUTANEOUS
  Administered 2020-11-18 – 2020-11-21 (×7): 2 [IU] via SUBCUTANEOUS

## 2020-10-18 MED ORDER — CARVEDILOL 25 MG PO TABS
25.0000 mg | ORAL_TABLET | Freq: Two times a day (BID) | ORAL | Status: DC
  Administered 2020-10-18 – 2020-11-01 (×29): 25 mg via ORAL
  Filled 2020-10-18 (×30): qty 1

## 2020-10-18 MED ORDER — ATORVASTATIN CALCIUM 80 MG PO TABS
80.0000 mg | ORAL_TABLET | Freq: Every day | ORAL | Status: DC
  Administered 2020-10-18 – 2020-12-27 (×71): 80 mg via ORAL
  Filled 2020-10-18 (×72): qty 1

## 2020-10-18 MED ORDER — SODIUM CHLORIDE 0.9 % IV SOLN
2.0000 g | Freq: Two times a day (BID) | INTRAVENOUS | Status: DC
  Administered 2020-10-18 – 2020-10-28 (×20): 2 g via INTRAVENOUS
  Filled 2020-10-18 (×20): qty 2

## 2020-10-18 MED ORDER — INSULIN ASPART 100 UNIT/ML IJ SOLN
15.0000 [IU] | Freq: Once | INTRAMUSCULAR | Status: AC
  Administered 2020-10-18: 15 [IU] via SUBCUTANEOUS

## 2020-10-18 MED ORDER — HYDROMORPHONE HCL 1 MG/ML IJ SOLN
1.0000 mg | Freq: Once | INTRAMUSCULAR | Status: AC
Start: 2020-10-18 — End: 2020-10-18
  Administered 2020-10-18: 1 mg via INTRAVENOUS
  Filled 2020-10-18: qty 1

## 2020-10-18 MED ORDER — GABAPENTIN 100 MG PO CAPS
100.0000 mg | ORAL_CAPSULE | Freq: Three times a day (TID) | ORAL | Status: DC
  Administered 2020-10-18 – 2020-12-13 (×159): 100 mg via ORAL
  Filled 2020-10-18 (×170): qty 1

## 2020-10-18 MED ORDER — INSULIN GLARGINE 100 UNIT/ML ~~LOC~~ SOLN
10.0000 [IU] | Freq: Every day | SUBCUTANEOUS | Status: DC
  Administered 2020-10-19 – 2020-10-20 (×2): 10 [IU] via SUBCUTANEOUS
  Filled 2020-10-18 (×3): qty 0.1

## 2020-10-18 MED ORDER — DAPSONE 100 MG PO TABS
100.0000 mg | ORAL_TABLET | Freq: Every day | ORAL | Status: DC
  Administered 2020-10-18 – 2020-12-27 (×70): 100 mg via ORAL
  Filled 2020-10-18 (×71): qty 1

## 2020-10-18 MED ORDER — POLYETHYLENE GLYCOL 3350 17 G PO PACK
17.0000 g | PACK | Freq: Every day | ORAL | Status: DC | PRN
  Administered 2020-11-06: 17 g via ORAL
  Filled 2020-10-18 (×2): qty 1

## 2020-10-18 MED ORDER — OXYCODONE HCL 5 MG PO TABS
5.0000 mg | ORAL_TABLET | Freq: Four times a day (QID) | ORAL | Status: DC | PRN
  Administered 2020-10-18 – 2020-10-28 (×6): 5 mg via ORAL
  Filled 2020-10-18 (×7): qty 1

## 2020-10-18 MED ORDER — ENOXAPARIN SODIUM 60 MG/0.6ML IJ SOSY
60.0000 mg | PREFILLED_SYRINGE | INTRAMUSCULAR | Status: DC
  Administered 2020-10-19 – 2020-10-26 (×8): 60 mg via SUBCUTANEOUS
  Filled 2020-10-18 (×8): qty 0.6

## 2020-10-18 MED ORDER — TICAGRELOR 90 MG PO TABS
90.0000 mg | ORAL_TABLET | Freq: Two times a day (BID) | ORAL | Status: DC
  Administered 2020-10-18 – 2020-11-19 (×61): 90 mg via ORAL
  Filled 2020-10-18 (×63): qty 1

## 2020-10-18 MED ORDER — CARVEDILOL 12.5 MG PO TABS: 25.0000 mg | ORAL_TABLET | Freq: Two times a day (BID) | ORAL | Status: DC

## 2020-10-18 MED ORDER — LACTATED RINGERS IV BOLUS
1000.0000 mL | Freq: Once | INTRAVENOUS | Status: AC
  Administered 2020-10-18: 1000 mL via INTRAVENOUS

## 2020-10-18 MED ORDER — ENOXAPARIN SODIUM 40 MG/0.4ML IJ SOSY: 40.0000 mg | PREFILLED_SYRINGE | INTRAMUSCULAR | Status: DC

## 2020-10-18 MED ORDER — ALLOPURINOL 300 MG PO TABS
300.0000 mg | ORAL_TABLET | Freq: Every day | ORAL | Status: DC
  Administered 2020-10-18 – 2020-11-01 (×15): 300 mg via ORAL
  Filled 2020-10-18 (×16): qty 1

## 2020-10-18 MED ORDER — METRONIDAZOLE 500 MG/100ML IV SOLN
500.0000 mg | Freq: Three times a day (TID) | INTRAVENOUS | Status: DC
  Administered 2020-10-18 – 2020-10-19 (×4): 500 mg via INTRAVENOUS
  Filled 2020-10-18 (×4): qty 100

## 2020-10-18 MED ORDER — GADOBUTROL 1 MMOL/ML IV SOLN
10.0000 mL | Freq: Once | INTRAVENOUS | Status: AC | PRN
  Administered 2020-10-18: 10 mL via INTRAVENOUS

## 2020-10-18 MED ORDER — ASPIRIN EC 81 MG PO TBEC
81.0000 mg | DELAYED_RELEASE_TABLET | Freq: Every day | ORAL | Status: DC
  Administered 2020-10-18 – 2020-12-27 (×68): 81 mg via ORAL
  Filled 2020-10-18 (×70): qty 1

## 2020-10-18 MED ORDER — INSULIN ASPART 100 UNIT/ML IJ SOLN
0.0000 [IU] | Freq: Every day | INTRAMUSCULAR | Status: DC
  Administered 2020-10-19 – 2020-10-20 (×2): 2 [IU] via SUBCUTANEOUS
  Administered 2020-10-21: 3 [IU] via SUBCUTANEOUS
  Administered 2020-10-22: 2 [IU] via SUBCUTANEOUS
  Administered 2020-10-23: 4 [IU] via SUBCUTANEOUS
  Administered 2020-10-25: 2 [IU] via SUBCUTANEOUS
  Administered 2020-10-27: 5 [IU] via SUBCUTANEOUS
  Administered 2020-10-28: 3 [IU] via SUBCUTANEOUS
  Administered 2020-12-02: 2 [IU] via SUBCUTANEOUS

## 2020-10-18 MED ORDER — SODIUM CHLORIDE 0.9 % IV SOLN
2.0000 g | Freq: Once | INTRAVENOUS | Status: AC
  Administered 2020-10-18: 2 g via INTRAVENOUS
  Filled 2020-10-18: qty 2

## 2020-10-18 MED ORDER — AMLODIPINE BESYLATE 5 MG PO TABS: 10.0000 mg | ORAL_TABLET | Freq: Every day | ORAL | Status: DC

## 2020-10-18 MED ORDER — AMLODIPINE BESYLATE 10 MG PO TABS
10.0000 mg | ORAL_TABLET | Freq: Every day | ORAL | Status: DC
  Administered 2020-10-19 – 2020-11-01 (×14): 10 mg via ORAL
  Filled 2020-10-18 (×15): qty 1

## 2020-10-18 MED ORDER — INSULIN ASPART 100 UNIT/ML IV SOLN
10.0000 [IU] | Freq: Once | INTRAVENOUS | Status: AC
  Administered 2020-10-18: 10 [IU] via INTRAVENOUS

## 2020-10-18 MED ORDER — OXYBUTYNIN CHLORIDE 5 MG PO TABS
2.5000 mg | ORAL_TABLET | Freq: Two times a day (BID) | ORAL | Status: DC
  Administered 2020-10-18 – 2020-12-27 (×137): 2.5 mg via ORAL
  Filled 2020-10-18 (×2): qty 0.5
  Filled 2020-10-18: qty 1
  Filled 2020-10-18: qty 0.5
  Filled 2020-10-18 (×3): qty 1
  Filled 2020-10-18 (×2): qty 0.5
  Filled 2020-10-18: qty 1
  Filled 2020-10-18: qty 0.5
  Filled 2020-10-18 (×2): qty 1
  Filled 2020-10-18: qty 0.5
  Filled 2020-10-18: qty 1
  Filled 2020-10-18: qty 0.5
  Filled 2020-10-18: qty 1
  Filled 2020-10-18: qty 0.5
  Filled 2020-10-18: qty 1
  Filled 2020-10-18: qty 0.5
  Filled 2020-10-18 (×2): qty 1
  Filled 2020-10-18: qty 0.5
  Filled 2020-10-18 (×3): qty 1
  Filled 2020-10-18 (×2): qty 0.5
  Filled 2020-10-18 (×3): qty 1
  Filled 2020-10-18 (×2): qty 0.5
  Filled 2020-10-18: qty 1
  Filled 2020-10-18 (×3): qty 0.5
  Filled 2020-10-18: qty 1
  Filled 2020-10-18 (×2): qty 0.5
  Filled 2020-10-18: qty 1
  Filled 2020-10-18: qty 0.5
  Filled 2020-10-18 (×2): qty 1
  Filled 2020-10-18: qty 0.5
  Filled 2020-10-18 (×3): qty 1
  Filled 2020-10-18 (×2): qty 0.5
  Filled 2020-10-18 (×9): qty 1
  Filled 2020-10-18: qty 0.5
  Filled 2020-10-18 (×4): qty 1
  Filled 2020-10-18: qty 0.5
  Filled 2020-10-18 (×4): qty 1
  Filled 2020-10-18 (×3): qty 0.5
  Filled 2020-10-18 (×2): qty 1
  Filled 2020-10-18 (×2): qty 0.5
  Filled 2020-10-18 (×3): qty 1
  Filled 2020-10-18: qty 0.5
  Filled 2020-10-18 (×2): qty 1
  Filled 2020-10-18: qty 0.5
  Filled 2020-10-18: qty 1
  Filled 2020-10-18: qty 0.5
  Filled 2020-10-18 (×2): qty 1
  Filled 2020-10-18: qty 0.5
  Filled 2020-10-18: qty 1
  Filled 2020-10-18: qty 0.5
  Filled 2020-10-18: qty 1
  Filled 2020-10-18: qty 0.5
  Filled 2020-10-18 (×3): qty 1
  Filled 2020-10-18 (×2): qty 0.5
  Filled 2020-10-18 (×5): qty 1
  Filled 2020-10-18 (×2): qty 0.5
  Filled 2020-10-18: qty 1
  Filled 2020-10-18: qty 0.5
  Filled 2020-10-18 (×2): qty 1
  Filled 2020-10-18 (×6): qty 0.5
  Filled 2020-10-18 (×2): qty 1
  Filled 2020-10-18: qty 0.5
  Filled 2020-10-18 (×4): qty 1
  Filled 2020-10-18 (×2): qty 0.5
  Filled 2020-10-18: qty 1
  Filled 2020-10-18: qty 0.5
  Filled 2020-10-18 (×2): qty 1
  Filled 2020-10-18: qty 0.5
  Filled 2020-10-18 (×2): qty 1
  Filled 2020-10-18: qty 0.5
  Filled 2020-10-18 (×2): qty 1
  Filled 2020-10-18 (×2): qty 0.5
  Filled 2020-10-18 (×9): qty 1

## 2020-10-18 MED ORDER — ESCITALOPRAM OXALATE 10 MG PO TABS
20.0000 mg | ORAL_TABLET | Freq: Every day | ORAL | Status: DC
  Administered 2020-10-18 – 2020-12-27 (×71): 20 mg via ORAL
  Filled 2020-10-18 (×4): qty 1
  Filled 2020-10-18 (×3): qty 2
  Filled 2020-10-18: qty 1
  Filled 2020-10-18: qty 2
  Filled 2020-10-18 (×4): qty 1
  Filled 2020-10-18 (×2): qty 2
  Filled 2020-10-18: qty 1
  Filled 2020-10-18 (×3): qty 2
  Filled 2020-10-18: qty 1
  Filled 2020-10-18: qty 2
  Filled 2020-10-18 (×3): qty 1
  Filled 2020-10-18 (×3): qty 2
  Filled 2020-10-18 (×2): qty 1
  Filled 2020-10-18 (×2): qty 2
  Filled 2020-10-18 (×2): qty 1
  Filled 2020-10-18 (×2): qty 2
  Filled 2020-10-18: qty 1
  Filled 2020-10-18: qty 2
  Filled 2020-10-18: qty 1
  Filled 2020-10-18: qty 2
  Filled 2020-10-18 (×13): qty 1
  Filled 2020-10-18: qty 2
  Filled 2020-10-18 (×6): qty 1
  Filled 2020-10-18 (×2): qty 2
  Filled 2020-10-18 (×3): qty 1
  Filled 2020-10-18 (×2): qty 2
  Filled 2020-10-18 (×2): qty 1
  Filled 2020-10-18 (×2): qty 2
  Filled 2020-10-18: qty 1
  Filled 2020-10-18 (×2): qty 2

## 2020-10-18 MED ORDER — ICOSAPENT ETHYL 1 G PO CAPS
2.0000 g | ORAL_CAPSULE | Freq: Two times a day (BID) | ORAL | Status: DC
  Administered 2020-10-18 – 2020-12-27 (×135): 2 g via ORAL
  Filled 2020-10-18 (×140): qty 2

## 2020-10-18 NOTE — ED Notes (Signed)
Patient transported to MRI 

## 2020-10-18 NOTE — Progress Notes (Signed)
Pharmacy Antibiotic Note  Keith [name] is a 55 y.o. male admitted on 10/17/2020 with  wound infection .  Pt has been on doxycycline since 7/2 without improvement.  Pharmacy has been consulted for cefepime dosing.  Plan: Cefepime 2g IV Q12H.  Height: '5\' 10"'$  (177.8 cm) Weight: 127 kg (279 lb 15.8 oz) IBW/kg (Calculated) : 73  Temp (24hrs), Avg:98.8 F (37.1 C), Min:98.6 F (37 C), Max:99.2 F (37.3 C)  Recent Labs  Lab 10/17/20 1807  WBC 8.7  CREATININE 2.19*  LATICACIDVEN 2.0*    Estimated Creatinine Clearance: 51 mL/min (A) (by C-G formula based on SCr of 2.19 mg/dL (H)).    Allergies  Allergen Reactions   Elemental Sulfur Itching    Patient stated he's allergic to "sulfur" AND "sulfa"   Sulfa Antibiotics Itching    Thank you for allowing pharmacy to be a part of this patient's care.  Wynona Neat, PharmD, BCPS  10/18/2020 4:12 AM

## 2020-10-18 NOTE — Evaluation (Signed)
Physical Therapy Evaluation Patient Details Name: Keith [name] MRN: [name] DOB: 12/25/1965 Today's Date: 10/18/2020   History of Present Illness  Keith [name] is a 55 y.o. male  who is sent to the ED by his orthopedic surgery provider due to concern for left heel ulcer that had failed outpatient therapy. with medical history significant for HIV, CKD 3B, type 2 diabetes, hypertension, hyperlipidemia, coronary artery disease, status post right below the knee amputation,  Clinical Impression   Pt admitted with above diagnosis.   Comes from home where he lives with his mother in a single level home with a few steps to enter; Has been walking with R prosthesis and RW, L lower leg and foot pain has recently made walking intolerable and lead to this admission; Pt and his mother are having difficulty meeting his wound care needs/dressing changes at home, and pt is curious about going to rehab for the care he needs for wound healing;   Presents to PT with L heel ulcer, LLE pain, weight bearing restrictions LLE;  Overall strong upper body, and he is able to perform bed mobility well, and needs min assist and cues for scooting and anterior posterior transfers; He also has a R prosthesis, and goals of standing in single limb stance on RLE an prosthesis can be functional and reachable;   Pt currently with functional limitations due to the deficits listed below (see PT Problem List). Pt will benefit from skilled PT to increase their independence and safety with mobility to allow discharge to the venue listed below.  See discussion re: considerations for dc plan;  This will also depend on the medical course for his L heel wound; We will proceed NWB (unless otherwise ordered); will consider a PRAFO for L foot protection, and to keep heel off loaded     Follow Up Recommendations Post-acute Rehabilitation:  with medicaid, if we go with SNF, it is unlikely his PT/OT would be covered;   Consider using this  acute stay to get to independence at wheelchair (or higher) level,  and then perhaps at SNF he could get the support he needs for LLE wound healing;  Or, then perhaps look into dc home with a personal care attendant to help him with wound care needs, and assist that his mother can't help him with anymore.  We can also conisder CIR to get to independence at wheelchair (or higher) level, and look into getting a personal care attendant for home to help him with wound care needs, and assist that his mother can't help him with anymore.    Equipment Recommendations  Wheelchair (measurements PT);Wheelchair cushion (measurements PT) (perhaps sliding board)  Ramp   Recommendations for Other Services OT consult     Precautions / Restrictions Precautions Precautions: Fall Precaution Comments: R BKA Restrictions Weight Bearing Restrictions: Yes LLE Weight Bearing: Non weight bearing Other Position/Activity Restrictions: Will keep pt NWB LLE unless otherwise ordered      Mobility  Bed Mobility Overal bed mobility: Modified Independent             General bed mobility comments: good use of bed rails, and able to push up to semi-long sit in bed    Transfers Overall transfer level: Needs assistance   Transfers: Anterior-Posterior Transfer       Anterior-Posterior transfers: Min assist   General transfer comment: min assist and use of bed pad to turn and scoot to position self in front of recliner; once able to reach armrests,  good bil UE boost backwards into chair  Ambulation/Gait                Stairs            Wheelchair Mobility    Modified Rankin (Stroke Patients Only)       Balance     Sitting balance-Leahy Scale: Good                                       Pertinent Vitals/Pain Pain Assessment: Faces Faces Pain Scale: Hurts even more Pain Location: L LE, foot, heel Pain Descriptors / Indicators: Discomfort Pain Intervention(s):  Monitored during session    Home Living Family/patient expects to be discharged to:: Private residence Living Arrangements: Parent Available Help at Discharge: Friend(s);Available PRN/intermittently Type of Home: House Home Access: Stairs to enter Entrance Stairs-Rails: Psychiatric nurse of Steps: 3 Home Layout: One level Home Equipment: Walker - 2 wheels;Shower seat      Prior Function Level of Independence: Independent with assistive device(s)         Comments: uses prosthesis and mobilizes with walker; Leading up to this admission, LLE pain has limited his ability to walk, and he reports at times he has had to crawl to the bathroom and pull himself up onto toilet     Hand Dominance   Dominant Hand: Right    Extremity/Trunk Assessment   Upper Extremity Assessment Upper Extremity Assessment: Overall WFL for tasks assessed    Lower Extremity Assessment Lower Extremity Assessment: RLE deficits/detail;LLE deficits/detail RLE Deficits / Details: bka; prosthesis in room LLE Deficits / Details: Unstageable ulcer plantar surface heel; lower leg and ankle edematous; noting weakness as well, with difficulty lifting straight leg off of bed against gravity LLE: Unable to fully assess due to pain       Communication   Communication: No difficulties  Cognition Arousal/Alertness: Awake/alert Behavior During Therapy: WFL for tasks assessed/performed Overall Cognitive Status: Within Functional Limits for tasks assessed                                        General Comments  Lengthy discussion with pt re: rational for keeping NWB at this time for wound healing, and that the lateral scoot or anterior posterior transfers, and using a wheelchair for mobility are indicated at this time -- we an work on pivot transfer on his R prosthesis as well; Today, he was very hesitant to let his LLE into the dependent position due to anticipation of pain, so we  opted for the anterior posterior trnsfer    Exercises     Assessment/Plan    PT Assessment Patient needs continued PT services  PT Problem List Decreased strength;Decreased activity tolerance;Decreased balance;Decreased knowledge of use of DME;Decreased safety awareness;Decreased knowledge of precautions;Pain       PT Treatment Interventions DME instruction;Gait training;Stair training;Functional mobility training;Therapeutic activities;Therapeutic exercise;Balance training;Patient/family education    PT Goals (Current goals can be found in the Care Plan section)  Acute Rehab PT Goals Patient Stated Goal: Hopes ot be able to go to SNF for rehab PT Goal Formulation: With patient Time For Goal Achievement: 11/01/20 Potential to Achieve Goals: Good Additional Goals Additional Goal #1: Pt will propel wheelchair including turns and backwards 1000 ft independently    Frequency Min 3X/week  Barriers to discharge Other (comment) Pt appropriately is interested in SNF to have necessary assist, especially with LLE ulcer management, as his mother is aging and unable to meet his care needs; With his current insurance situation, though, I'm not sure that therapy services would be covered    Co-evaluation               AM-PAC PT "6 Clicks" Mobility  Outcome Measure Help needed turning from your back to your side while in a flat bed without using bedrails?: None Help needed moving from lying on your back to sitting on the side of a flat bed without using bedrails?: None Help needed moving to and from a bed to a chair (including a wheelchair)?: A Lot Help needed standing up from a chair using your arms (e.g., wheelchair or bedside chair)?: A Lot Help needed to walk in hospital room?: A Lot Help needed climbing 3-5 steps with a railing? : A Lot 6 Click Score: 16    End of Session Equipment Utilized During Treatment: Other (comment) (bed pad) Activity Tolerance: Patient tolerated  treatment well Patient left: in chair;with call bell/phone within reach;with chair alarm set;Other (comment) (facing bed with LEs propped on bed) Nurse Communication: Mobility status PT Visit Diagnosis: Other abnormalities of gait and mobility (R26.89);Pain Pain - Right/Left: Left Pain - part of body: Ankle and joints of foot;Leg    Time: 1235-1309 PT Time Calculation (min) (ACUTE ONLY): 34 min   Charges:   PT Evaluation $PT Eval Moderate Complexity: 1 Mod PT Treatments $Therapeutic Activity: 8-22 mins        Roney Marion, PT  Acute Rehabilitation Services Pager [phone] Office [phone]   Colletta [place] 10/18/2020, 4:38 PM

## 2020-10-18 NOTE — Progress Notes (Signed)
CBG 405, notified MD waiting for orders.

## 2020-10-18 NOTE — Progress Notes (Signed)
PROGRESS NOTE    HUY [name]  EXH:[id number] DOB: [birth year] DOA: 10/17/2020 [name], NP   Brief Narrative:  Keith [name] is a 55 y.o. male with medical history significant for HIV, CKD 3B, type 2 diabetes, hypertension, hyperlipidemia, coronary artery disease, status post right below the knee amputation, who is sent to the ED by his orthopedic surgery provider due to concern for left heel ulcer that had failed outpatient therapy.  Associated with subjective fevers.  Patient was seen in the ED on 10/07/2020 and completed 7 days of oral doxycycline.  He followed up with orthopedic surgery and had another follow-up appointment earlier on the day of presentation where he was noted to have worsening left foot ulcer.  Patient was advised to come to the ED for IV antibiotics therapy.  Reports constant pain in his left foot.  Assessment & Plan:   Active Problems:   Cellulitis  Left foot ulcer with concern for cellulitis, POA Failed outpatient therapy with 7-day course of doxycycline, started on 10/07/2020 Continue cefepime and IV Flagyl. Patient sent in from orthopedic office (Dr Sharol Given) - appreciate insight and recs Wound care followinng No osteomyelitis or drainage abscess identified from MRI of the left foot done on 10/07/2020.  Large heel ulcer below the posterior calcaneus.  Dorsal subcutaneous edema in the ankle and foot tracking into the toes cellulitis is not excluded. ESR 120; CRP 11 at intake Follow cultures   Profoundly uncontrolled diabetes with hyperglycemia A1C 14 - glucose close to 500 overnight Increase sliding scale; follow hypoglycemia protocol  Diabetes polyneuropathy Resume home regimen.   HIV Resume home HAART regimen  Chronic anxiety/depression Resume home regimen.  S/p R BKA with ambulatory dysfunction PT OT to follow   CKD 3B At baseline  DVT prophylaxis: Subcu Lovenox daily Code Status: Full code Family Communication: None at bedside Status  is: inpt  Dispo: The patient is from: Home              Anticipated d/c is to: TBD              Anticipated d/c date is: 72+h              Patient currently NOT medically stable for discharge  Consultants:  Orthopedic Surgery  Procedures:  TBD  Antimicrobials:  Cefepime/Metronidazole   Subjective: No acute issues/events overnight - pain ongoing but denies headache fevers chills chest pain nausea vomiting diarrhea or constipation  Objective: Vitals:   10/18/20 0400 10/18/20 0430 10/18/20 0445 10/18/20 0652  BP: (!) 180/104 (!) 182/112 (!) 165/103 (!) 175/113  Pulse: 86 87 81 86  Resp: _0 Temp:    99.2 F (37.3 C)  TempSrc:    Oral  SpO2: 100% 96% 97% 95%  Weight:      Height:       No intake or output data in the 24 hours ending 10/18/20 3810 Filed Weights   10/17/20 1800  Weight: 127 kg    Examination: General: 55 y.o. year-old male well developed well nourished in no acute distress.  Alert and oriented x3. Cardiovascular: Regular rate and rhythm with no rubs or gallops.  No thyromegaly or JVD noted.  No lower extremity edema. 2/4 pulses in all 4 extremities. Respiratory: Clear to auscultation with no wheezes or rales. Good inspiratory effort. Abdomen: Soft nontender nondistended with normal bowel sounds x4 quadrants. Muskuloskeletal: R BKA, left lower extremity 2+ pitting edema to knee with unstageable heel ulcer(see  below) Neuro: CN II-XII intact, strength, sensation, reflexes Foot as of 10/17/20    Psychiatry: Judgement and insight appear normal. Mood is appropriate for condition and setting     Data Reviewed: I have personally reviewed following labs and imaging studies  CBC: Recent Labs  Lab 10/17/20 1807  WBC 8.7  NEUTROABS 6.4  HGB 10.6*  HCT 34.0*  MCV 85.0  PLT 563   Basic Metabolic Panel: Recent Labs  Lab 10/17/20 1807 10/18/20 0405 10/18/20 0436  NA 127*  --   --   K 4.6  --   --   CL 95*  --   --   CO2 22  --   --    GLUCOSE 575*  --   --   BUN 29*  --   --   CREATININE 2.19* 2.19*  --   CALCIUM 8.7*  --   --   MG  --   --  1.9  PHOS  --   --  3.9   GFR: Estimated Creatinine Clearance: 51 mL/min (A) (by C-G formula based on SCr of 2.19 mg/dL (H)). Liver Function Tests: Recent Labs  Lab 10/17/20 1807  AST 15  ALT 13  ALKPHOS 85  BILITOT 0.7  PROT 7.8  ALBUMIN 2.4*   No results for input(s): LIPASE, AMYLASE in the last 168 hours. No results for input(s): AMMONIA in the last 168 hours. Coagulation Profile: No results for input(s): INR, PROTIME in the last 168 hours. Cardiac Enzymes: No results for input(s): CKTOTAL, CKMB, CKMBINDEX, TROPONINI in the last 168 hours. BNP (last 3 results) No results for input(s): PROBNP in the last 8760 hours. HbA1C: No results for input(s): HGBA1C in the last 72 hours. CBG: Recent Labs  Lab 10/18/20 0404 10/18/20 0635 10/18/20 0747  GLUCAP 563* 444* 405*   Lipid Profile: No results for input(s): CHOL, HDL, LDLCALC, TRIG, CHOLHDL, LDLDIRECT in the last 72 hours. Thyroid Function Tests: No results for input(s): TSH, T4TOTAL, FREET4, T3FREE, THYROIDAB in the last 72 hours. Anemia Panel: No results for input(s): VITAMINB12, FOLATE, FERRITIN, TIBC, IRON, RETICCTPCT in the last 72 hours. Sepsis Labs: Recent Labs  Lab 10/17/20 1807 10/18/20 0418  LATICACIDVEN 2.0* 1.2    No results found for this or any previous visit (from the past 240 hour(s)).    Radiology Studies: MR FOOT LEFT W WO CONTRAST  Result Date: 10/18/2020 CLINICAL DATA:  Heel ulceration with swelling of the foot. Possible osteomyelitis. EXAM: MRI OF THE LEFT FOREFOOT WITHOUT AND WITH CONTRAST TECHNIQUE: Multiplanar, multisequence MR imaging of the left foot was performed both before and after administration of intravenous contrast. CONTRAST:  9m GADAVIST GADOBUTROL 1 MMOL/ML IV SOLN COMPARISON:  Multiple exams, including 10/07/2020 and radiographs from 10/17/2020. FINDINGS:  Osteomyelitis protocol MRI of the foot was obtained, to include the entire foot and ankle. This protocol uses a large field of view to cover the entire foot and ankle, and is suitable for assessing bony structures for osteomyelitis. Due to the large field of view and imaging plane choice, this protocol is less sensitive for assessing small structures such as ligamentous structures of the foot and ankle, compared to a dedicated forefoot or dedicated hindfoot exam. Bones/Joint/Cartilage Suspected early calcaneal osteomyelitis is observed with endosteal and adjacent marrow edema and enhancement along the posteroinferior calcaneus adjacent to the large heel ulceration. This is substantially progressive compared to 10/07/2020. Strictly speaking, reactive findings could cause a similar appearance but the progression in bony appearance as well as progression  in the ulceration are concerning for early osteomyelitis. No overt bony destructive findings. Small degenerative lesions along the first MTP joint associated with spurring Ligaments Lisfranc ligament grossly intact. No large or prominent ligamentous derangement along the ankle. Muscles and Tendons Diffuse low-grade muscular edema and potentially low-grade enhancement, probably neurogenic. Potential mild proximal plantar fasciitis. Soft tissues Plantar ulceration below the calcaneus about 6.6 by 7.1 cm, with speckled low density in the soft tissues compatible with gas, and associated soft tissue hypoenhancement extending to the periosteal margin of the calcaneus and adjacent plantar fascia. Diffuse subcutaneous edema tracking in the dorsum of the foot and medially and laterally along the ankles suspicious for potential cellulitis. In the subcutaneous tissues superficial to the medial malleolus and medial flexor tendons, elongated bands of hypoenhancement measuring about 5.5 by 0.8 by 4.1 cm may represent incipient abscess formation although overt drainable abscess is  not currently identified. IMPRESSION: 1. Progressive calcaneal ulceration associated with new endosteal and adjacent marrow edema and enhancement along the posteroinferior calcaneus suspicious for early osteomyelitis. 2. Subcutaneous edema medially and laterally in the ankle and also along the dorsum of the foot favoring cellulitis. Faint bands of hypoenhancement superficial to the medial malleolus may represent incipient abscess formation although currently a drainable abscess is not identified. 3. Probable neurogenic edema in the muscular tissues diffusely. 4. Potential mild plantar fasciitis. 5. Degenerative findings at the first MTP joint. Electronically Signed   By: Van Clines M.D.   On: 10/18/2020 07:54   DG Foot Complete Left  Result Date: 10/17/2020 CLINICAL DATA:  Pt c/o left heel wound x several weeks; pt has been seen at the wound center. [name] sent the patient to the ED tonight. Hx of DM. No hx of prior injuries or surgeries. Patient could not internally rotate his left leg due t.*comment was truncated*Ulcer on the calcaneus. EXAM: LEFT FOOT - COMPLETE 3+ VIEW COMPARISON:  None. FINDINGS: There is soft tissue ulceration along the plantar surface of the foot adjacent to the calcaneus. There is gas within this ulceration which measures approximately 1 cm in depth. No evidence of erosion of the adjacent calcaneus. There is soft tissue swelling in the forefoot on the dorsal surface. In comparison to radiograph 10/07/2020 the soft tissue ulceration is increased in size measuring 5 cm x 1 cm compared to 2.3 by 1.1 cm. IMPRESSION: 1. Increase in volume of the soft tissue ulceration along the plantar surface of the calcaneus. 2. No evidence of osteomyelitis. 3. Soft tissue swelling of the forefoot. Electronically Signed   By: Suzy Bouchard M.D.   On: 10/17/2020 19:44    Scheduled Meds:  allopurinol  300 mg Oral Daily   amLODipine  10 mg Oral Daily   aspirin EC  81 mg Oral Daily    atorvastatin  80 mg Oral Daily   bictegravir-emtricitabine-tenofovir AF  1 tablet Oral Daily   carvedilol  25 mg Oral BID WC   dapsone  100 mg Oral Daily   enoxaparin (LOVENOX) injection  40 mg Subcutaneous Q24H   escitalopram  20 mg Oral Daily   gabapentin  100 mg Oral TID   icosapent Ethyl  2 g Oral BID   insulin aspart  0-5 Units Subcutaneous QHS   insulin aspart  0-9 Units Subcutaneous TID WC   insulin glargine  10 Units Subcutaneous Daily   oxybutynin  2.5 mg Oral BID   ticagrelor  90 mg Oral BID   Continuous Infusions:  ceFEPime (MAXIPIME) IV  lactated ringers     metronidazole       LOS: 0 days   Time spent: 71mn   Delaine Canter C Jozsef Wescoat, DO [facility]  If 7PM-7AM, please contact night-coverage [email or web address]  10/18/2020, 8:22 AM

## 2020-10-18 NOTE — Consult Note (Signed)
   [facility] Inpatient Consult   10/18/2020  WENDELL WOLLE 11/04/1965 [name]  Patient is currently showing as Medicaid [place] [employer], not a [facility] Managed Medicaid, patient previously active under [employer] in which in [place] is showing as inactive for coverage.  Patient was currently active with [facility] for chronic disease management services.  Patient has been engaged by a [facility].  Our community based plan of care has focused on disease management and community resource support.    Plan: Patient's insurance status to be verified for [facility] Care Management.  Currently showing Medicaid [place] Access, will sign off and alert [facility] RN CM if not in network.    For additional questions or referrals please contact:  Natividad Brood, RN BSN [facility] Liaison  [phone] business mobile phone Toll free office [phone]  Fax number: [phone] [email or web address]

## 2020-10-18 NOTE — Consult Note (Signed)
WOC Nurse Consult Note: Patient receiving care in [facility] [phone]. Reason for Consult: left foot ulcer, followed by [name] and PAs in his service. Seen by Orthopedic PA yesterday, wound may need I&D. Wound type: Per note from PA yesterday Pressure Injury POA: Yes/No/NA Measurement: Wound bed: see photo Drainage (amount, consistency, odor)  Periwound: Dressing procedure/placement/frequency: Wash left foot with soap and water, pat dry. Wrap in kerlix. Perform daily.  NOTE: refer any additional questions or concerns regarding wound care of the left foot to the Orthopedic Surgical provider.  [place] nurse will not follow at this time.  Please re-consult the [facility] team if needed.  Val Riles, RN, MSN, CWOCN, CNS-BC, pager [phone]

## 2020-10-18 NOTE — Progress Notes (Signed)
Orthopedic Tech Progress Note Patient Details:  Keith [name] 03-27-1966 [id number] Called [facility] for walking PRAFO. [name], male   DOB: 11/11/1965, 55 y.o.   MRN: [id number]  Petra Kuba 10/18/2020, 5:26 PM

## 2020-10-18 NOTE — ED Provider Notes (Signed)
[facility] EMERGENCY DEPARTMENT Provider Note   CSN: [id number] Arrival date & time: 10/17/20  1700     History Chief Complaint  Patient presents with   Foot Ulcer    TARRENCE [name] is a 55 y.o. male.  The history is provided by the patient.  He has history of hypertension, diabetes, hyperlipidemia, coronary artery disease, chronic kidney disease, HIV disease and was sent to the emergency department by his orthopedic physician because of concern a left heel ulcer which was not responding to outpatient antibiotics.  He had been seen in the emergency department on 7/2 and started on doxycycline.  He followed up with his orthopedic physician and had another follow-up earlier today at which time they noted worsening of the ulcer and recommended he come to the ED to be started on IV antibiotics.  He is complaining of constant pain in the left foot.  He is status post right below the knee amputation.  He states a home health nurse did state that he had a fever, but he does not know how high his temperature has been.   Past Medical History:  Diagnosis Date   Abscess of right foot    abscess/ulcer of R transtibial amputation requiring IV abx   AIDS ([place])    Anemia    CAD (coronary artery disease)    a. MI with stenting of OM1 in 11/2018 with residual disease. b. acute STEMI 03/2020 s/p DES to OM1   Chronic anemia    Chronic knee pain    right   Chronic pain    CKD (chronic kidney disease), stage IV (HCC)    CVA (cerebral vascular accident) ([place]) 04/2020   Diabetes type 2, uncontrolled ([place])    HgA1c 17.6 (04/27/2010)   Diabetic foot ulcer ([place]) 01/2017   right foot   Dilatation of aorta (HCC)    Erectile dysfunction    Genital warts    HIV (human immunodeficiency virus infection) ([place]) 2009   CD4 count 100, VL 13800 (05/01/2010)   Hyperlipidemia    Hypertension    Noncompliance with medication regimen    Osteomyelitis ([place])    h/o hand   Osteomyelitis of  metatarsal ([employer]) 04/28/2017    Patient Active Problem List   Diagnosis Date Noted   Atypical chest pain 01/65/5374   Acute metabolic encephalopathy 82/70/7867   Hyperglycemia due to diabetes mellitus ([place]) 08/29/2020   Hyperglycemia 08/29/2020   Acute cerebrovascular accident (CVA) due to ischemia ([place]) 04/28/2020   Type 2 diabetes mellitus with hyperlipidemia (HCC)    PAD (peripheral artery disease) ([place])    Hypoglycemia 04/25/2020   Acute ST elevation myocardial infarction (STEMI) due to occlusion of circumflex coronary artery ([place]) 03/22/2020   Uncontrolled type 2 diabetes mellitus with hyperglycemia ([place]) 01/14/2019   Elevated glucose 01/14/2019   Hx of BKA, right ([place]) 01/14/2019   Pressure injury of skin 11/11/2018   STEMI (ST elevation myocardial infarction) ([place]) 11/10/2018   CKD (chronic kidney disease) stage 4, GFR 15-29 ml/min (HCC) 11/10/2018   Amputation stump infection ([place]) 10/28/2018   Type II diabetes mellitus with renal manifestations ([place]) 08/07/2018   Uncontrolled type 2 diabetes mellitus with hyperosmolar nonketotic hyperglycemia ([place]) 08/07/2018   Non-pressure chronic ulcer of right calf limited to breakdown of skin ([place]) 07/06/2018   Type 2 diabetes mellitus without complication, without long-term current use of insulin ([place]) 06/15/2018   Chronic low back pain without sciatica 06/15/2018   Idiopathic chronic venous hypertension of left lower  extremity with ulcer and inflammation ([place])    Venous stasis ulcer of left calf limited to breakdown of skin without varicose veins ([place]) 04/23/2018   Acquired absence of right leg below knee ([place]) 06/13/2017   Acute kidney injury superimposed on CKD ([place]) 03/15/2017   Diabetic polyneuropathy associated with type 2 diabetes mellitus ([place]) 01/23/2017   AKI (acute kidney injury) ([place]) 09/28/2016   Nausea vomiting and diarrhea 09/28/2016   Onychomycosis of multiple toenails with type 2 diabetes mellitus ([place]) 08/29/2015   MRSA  carrier 04/20/2014   Penile wart 02/22/2014   HIV disease ([place])    Insulin-requiring or dependent type II diabetes mellitus ([place]) 02/04/2014   Dental anomaly 11/20/2012   Arthritis of right knee 02/23/2012   Hyperlipidemia 11/10/2011   Hyponatremia 11/10/2011   Chronic pain 08/07/2011   Meralgia paraesthetica 04/23/2011   ERECTILE DYSFUNCTION 08/22/2008   Essential hypertension 05/19/2008    Past Surgical History:  Procedure Laterality Date   AMPUTATION Right 05/02/2017   Procedure: AMPUTATION TRANSMETARSAL;  Surgeon: [name], MD;  Location: [place];  Service: Orthopedics;  Laterality: Right;   AMPUTATION Right 06/13/2017   Procedure: RIGHT BELOW KNEE AMPUTATION;  Surgeon: [name], MD;  Location: [place];  Service: Orthopedics;  Laterality: Right;   BELOW KNEE LEG AMPUTATION Right 06/13/2017   BELOW KNEE LEG AMPUTATION     CORONARY STENT INTERVENTION N/A 11/10/2018   Procedure: CORONARY STENT INTERVENTION;  Surgeon: Leonie Man, MD;  Location: [place] [facility];  Service: Cardiovascular;  Laterality: N/A;   CORONARY/GRAFT ACUTE MI REVASCULARIZATION N/A 03/21/2020   Procedure: Coronary/Graft Acute MI Revascularization;  Surgeon: [name], MD;  Location: [place] [facility];  Service: Cardiovascular;  Laterality: N/A;   HERNIA REPAIR     I & D EXTREMITY Left 08/21/2014   Procedure: INCISION AND DRAINAGE LEFT SMALL FINGER;  Surgeon: Leanora Cover, MD;  Location: [place];  Service: Orthopedics;  Laterality: Left;   I & D EXTREMITY Right 03/18/2017   Procedure: IRRIGATION AND DEBRIDEMENT EXTREMITY;  Surgeon: [name], MD;  Location: [place];  Service: Orthopedics;  Laterality: Right;   LEFT HEART CATH AND CORONARY ANGIOGRAPHY N/A 11/10/2018   Procedure: LEFT HEART CATH AND CORONARY ANGIOGRAPHY;  Surgeon: Leonie Man, MD;  Location: [place] [facility];  Service: Cardiovascular;  Laterality: N/A;   LEFT HEART CATH AND CORONARY ANGIOGRAPHY N/A 03/21/2020   Procedure:  LEFT HEART CATH AND CORONARY ANGIOGRAPHY;  Surgeon: [name], MD;  Location: [place] [facility];  Service: Cardiovascular;  Laterality: N/A;   MINOR IRRIGATION AND DEBRIDEMENT OF WOUND Right 04/22/2014   Procedure: IRRIGATION AND DEBRIDEMENT OF RIGHT NECK ABCESS;  Surgeon: Jerrell Belfast, MD;  Location: [place];  Service: ENT;  Laterality: Right;   MULTIPLE EXTRACTIONS WITH ALVEOLOPLASTY N/A 01/18/2013   Procedure: MULTIPLE EXTRACION 3, 6, 7, 10, 11, 13, 21, 22, 27, 28, 29, 30 WITH ALVEOLOPLASTY;  Surgeon: Gae Bon, DDS;  Location: [place];  Service: Oral Surgery;  Laterality: N/A;   SKIN SPLIT GRAFT Right 03/21/2017   Procedure: IRRIGATION AND DEBRIDEMENT RIGHT FOOT AND APPLY SPLIT THICKNESS SKIN GRAFT AND WOUND VAC;  Surgeon: [name], MD;  Location: [place];  Service: Orthopedics;  Laterality: Right;   TEE WITHOUT CARDIOVERSION N/A 05/02/2017   Procedure: TRANSESOPHAGEAL ECHOCARDIOGRAM (TEE);  Surgeon: Acie Fredrickson Wonda Cheng, MD;  Location[facility];  Service: Cardiovascular;  Laterality: N/A;  coincidental to orthopedic case  Family History  Problem Relation Age of Onset   Hypertension Mother    Arthritis Father    Hypertension Father    Hypertension Brother    Cancer Maternal Grandmother 80       unknown type of cancer   Depression Paternal Grandmother     Social History   Tobacco Use   Smoking status: Never   Smokeless tobacco: Never  Vaping Use   Vaping Use: Never used  Substance Use Topics   Alcohol use: Not Currently   Drug use: Not Currently    Comment: OTC ASA    Home Medications Prior to Admission medications   Medication Sig Start Date End Date Taking? Authorizing Provider  acetaminophen (TYLENOL) 325 MG tablet Take 2 tablets (650 mg total) by mouth every 4 (four) hours as needed for mild pain, fever or headache. 05/03/20   Debbe Odea, MD  allopurinol (ZYLOPRIM) 300 MG tablet Take 300 mg by mouth daily. 06/09/20   [provider]  ALPRAZolam  [name]) 1 MG tablet Take 1 mg by mouth daily as needed for anxiety. 08/15/20   [provider]  amLODipine (NORVASC) 10 MG tablet Take 1 tablet (10 mg total) by mouth daily. 09/01/20 10/31/20  [name], MD  aspirin 81 MG EC tablet Take 1 tablet (81 mg total) by mouth daily. 03/27/20   [name], MD  atorvastatin (LIPITOR) 80 MG tablet Take 1 tablet (80 mg total) by mouth daily. 09/01/20 10/31/20  [name], MD  bictegravir-emtricitabine-tenofovir AF (BIKTARVY) 50-200-25 MG TABS tablet Take 1 tablet by mouth daily. 05/19/20   [name], MD  blood glucose meter kit and supplies Dispense based on patient and insurance preference. Use up to four times daily as directed. (FOR ICD-10 E10.9, E11.9). 10/17/20   [name], NP  carvedilol (COREG) 25 MG tablet Take 1 tablet (25 mg total) by mouth 2 (two) times daily with a meal. 09/01/20 10/31/20  [name], MD  dapsone 100 MG tablet TAKE 1 TABLET (100 MG TOTAL) BY MOUTH DAILY. 03/27/20 03/27/21  [name], Ava, DO  doxycycline (VIBRAMYCIN) 100 MG capsule Take 1 capsule (100 mg total) by mouth 2 (two) times daily. One po bid x 7 days 10/07/20   Sherwood Gambler, MD  escitalopram (LEXAPRO) 20 MG tablet Take 20 mg by mouth daily. 08/15/20   [provider]  gabapentin (NEURONTIN) 100 MG capsule Take 1 capsule (100 mg total) by mouth 3 (three) times daily. 09/01/20 10/31/20  [name], MD  glucose blood (COOL BLOOD GLUCOSE TEST STRIPS) test strip Use as instructed 02/16/20   [name], FNP  icosapent Ethyl (VASCEPA) 1 g capsule TAKE 2 CAPSULES (2 G TOTAL) BY MOUTH TWO TIMES DAILY. 03/27/20 03/27/21  [name], MD  insulin aspart protamine- aspart (NOVOLOG MIX 70/30) (70-30) 100 UNIT/ML injection Inject 0.3 mLs (30 Units total) into the skin 2 (two) times daily with a meal. With breakfast and dinner. 10/01/20 12/30/20  Enzo Bi, MD  Lancets [facility] ULTRASOFT) lancets Use as instructed 02/16/20   [name], FNP   metFORMIN (GLUCOPHAGE) 1000 MG tablet Take 1,000 mg by mouth 2 (two) times daily. 09/05/20   [provider]  nitroGLYCERIN (NITROSTAT) 0.4 MG SL tablet PLACE 1 TABLET (0.4 MG TOTAL) UNDER THE TONGUE EVERY FIVE MINUTES X 3 DOSES AS NEEDED FOR CHEST PAIN. 03/27/20 03/27/21  [name], Ava, DO  NOVOLOG MIX 70/30 FLEXPEN (70-30) 100 UNIT/ML FlexPen Inject 30 Units into the skin 2 (two)  times daily. 10/01/20   [provider]  omega-3 acid ethyl esters (LOVAZA) 1 g capsule Take 1 g by mouth 2 (two) times daily.    [provider]  oxybutynin (DITROPAN) 5 MG tablet TAKE 1 TABLET (5 MG TOTAL) BY MOUTH TWO TIMES DAILY. 03/27/20 03/27/21  [name], MD  oxyCODONE 10 MG TABS Take 1 tablet (10 mg total) by mouth every 6 (six) hours as needed for severe pain (Severe pain.). 05/03/20   Debbe Odea, MD  traZODone (DESYREL) 100 MG tablet Take 100 mg by mouth at bedtime. 09/11/20   [provider]    Allergies    Elemental sulfur and Sulfa antibiotics  Review of Systems   Review of Systems  All other systems reviewed and are negative.  Physical Exam Updated Vital Signs BP (!) 186/107 (BP Location: Left Arm)   Pulse 87   Temp 98.6 F (37 C) (Oral)   Resp 17   Ht _0  (1.778 m)   Wt 127 kg   SpO2 98%   BMI 40.17 kg/m   Physical Exam Vitals and nursing note reviewed.  55 year old male, resting comfortably and in no acute distress. Vital signs are significant for elevated blood pressure. Oxygen saturation is 98%, which is normal. Head is normocephalic and atraumatic. PERRLA, EOMI. Oropharynx is clear. Neck is nontender and supple without adenopathy or JVD. Back is nontender and there is no CVA tenderness. Lungs are clear without rales, wheezes, or rhonchi. Chest is nontender. Heart has regular rate and rhythm without murmur. Abdomen is soft, flat, nontender without masses or hepatosplenomegaly and peristalsis is normoactive. Extremities: Right below the  knee amputation.  There is 2+ edema of the left leg and foot.  There is a large ulcer noted on the plantar surface of the left heel.  There is warmth of the skin of the left foot and lower leg, but no erythema.  The base of the ulcerated area is firm, unable to push a cotton tip applicator into the soft tissues. Skin is warm and dry without rash. Neurologic: Mental status is normal, cranial nerves are intact, there are no motor or sensory deficits.  ED Results / Procedures / Treatments   Labs (all labs ordered are listed, but only abnormal results are displayed) Labs Reviewed  COMPREHENSIVE METABOLIC PANEL - Abnormal; Notable for the following components:      Result Value   Sodium 127 (*)    Chloride 95 (*)    Glucose, Bld 575 (*)    BUN 29 (*)    Creatinine, Ser 2.19 (*)    Calcium 8.7 (*)    Albumin 2.4 (*)    GFR, Estimated 35 (*)    All other components within normal limits  LACTIC ACID, PLASMA - Abnormal; Notable for the following components:   Lactic Acid, Venous 2.0 (*)    All other components within normal limits  CBC WITH DIFFERENTIAL/PLATELET - Abnormal; Notable for the following components:   RBC 4.00 (*)    Hemoglobin 10.6 (*)    HCT 34.0 (*)    All other components within normal limits  CULTURE, BLOOD (ROUTINE X 2)  CULTURE, BLOOD (ROUTINE X 2)  LACTIC ACID, PLASMA  TROPONIN I (HIGH SENSITIVITY)  TROPONIN I (HIGH SENSITIVITY)    EKG None  Radiology DG Foot Complete Left  Result Date: 10/17/2020 CLINICAL DATA:  Pt c/o left heel wound x several weeks; pt has been seen at the wound center. [name] sent  the patient to the ED tonight. Hx of DM. No hx of prior injuries or surgeries. Patient could not internally rotate his left leg due t.*comment was truncated*Ulcer on the calcaneus. EXAM: LEFT FOOT - COMPLETE 3+ VIEW COMPARISON:  None. FINDINGS: There is soft tissue ulceration along the plantar surface of the foot adjacent to the calcaneus. There is gas within this  ulceration which measures approximately 1 cm in depth. No evidence of erosion of the adjacent calcaneus. There is soft tissue swelling in the forefoot on the dorsal surface. In comparison to radiograph 10/07/2020 the soft tissue ulceration is increased in size measuring 5 cm x 1 cm compared to 2.3 by 1.1 cm. IMPRESSION: 1. Increase in volume of the soft tissue ulceration along the plantar surface of the calcaneus. 2. No evidence of osteomyelitis. 3. Soft tissue swelling of the forefoot. Electronically Signed   By: Suzy Bouchard M.D.   On: 10/17/2020 19:44    Procedures Procedures  CRITICAL CARE Performed by: Delora Fuel Total critical care time: 35 minutes Critical care time was exclusive of separately billable procedures and treating other patients. Critical care was necessary to treat or prevent imminent or life-threatening deterioration. Critical care was time spent personally by me on the following activities: development of treatment plan with patient and/or surrogate as well as nursing, discussions with consultants, evaluation of patient's response to treatment, examination of patient, obtaining history from patient or surrogate, ordering and performing treatments and interventions, ordering and review of laboratory studies, ordering and review of radiographic studies, pulse oximetry and re-evaluation of patient's condition.  Medications Ordered in ED Medications  lactated ringers bolus 1,000 mL (has no administration in time range)  insulin aspart (novoLOG) injection 10 Units (has no administration in time range)  vancomycin (VANCOREADY) IVPB 2000 mg/400 mL (has no administration in time range)  ondansetron (ZOFRAN) injection 4 mg (has no administration in time range)  HYDROmorphone (DILAUDID) injection 1 mg (has no administration in time range)  metroNIDAZOLE (FLAGYL) IVPB 500 mg (has no administration in time range)  enoxaparin (LOVENOX) injection 40 mg (has no administration in time  range)    ED Course  I have reviewed the triage vital signs and the nursing notes.  Pertinent labs & imaging results that were available during my care of the patient were reviewed by me and considered in my medical decision making (see chart for details).   MDM Rules/Calculators/A&P                         Left foot ulcer with cellulitis, possible osteomyelitis.  This has failed outpatient management.  Old records are reviewed confirming ED visit on 7/2 at which time he was started on doxycycline.  There were 2 separate notations regarding home health visit.  1 stated temperature was 100.5, the other stated temperature was 100.2.  In the ED today, WBC is normal, lactic acid level is borderline elevated and will be given some IV fluids.  Renal function is about at his baseline.  He has significant hyperglycemia with glucose 575, but normal anion gap.  No evidence of ketoacidosis.  Hyponatremia is present consistent with degree of hyperglycemia.  Hemoglobin has been slowly falling over the last 6 weeks and will need to be monitored.  He will be given IV fluids, insulin and started on intravenous vancomycin.  Will check sedimentation rate and CRP, but he will need to be admitted for IV antibiotics no matter what those results show.  Of note, sedimentation rate was 85 during his ED visit on 7/2.  Case is discussed with Dr. Nevada Crane of [facility], who agrees to admit the patient.  Final Clinical Impression(s) / ED Diagnoses Final diagnoses:  Cellulitis of left foot  Diabetic ulcer of left heel associated with diabetes mellitus of other type, unspecified ulcer stage (HCC)  Hyperglycemia  Renal insufficiency  Normochromic normocytic anemia  Elevated blood pressure reading with diagnosis of hypertension    Rx / DC Orders ED Discharge Orders     None        Delora Fuel, MD 56/25/63 0405

## 2020-10-18 NOTE — H&P (Signed)
History and Physical  Keith [name]:[id number] DOB: 1966/01/13 DOA: 10/17/2020  Referring physician: Dr. Roxanne [name], [place]. PCP: Keith Francois, NP  Outpatient Specialists: Orthopedic surgery. Patient coming from: Home.  Chief Complaint: Left heel ulcer  HPI: Keith [name] is a 55 y.o. male with medical history significant for HIV, CKD 3B, type 2 diabetes, hypertension, hyperlipidemia, coronary artery disease, status post right below the knee amputation, who is sent to the ED by his orthopedic surgery provider due to concern for left heel ulcer that had failed outpatient therapy.  Associated with subjective fevers.  Patient was seen in the ED on 10/07/2020 and completed 7 days of oral doxycycline.  He followed up with orthopedic surgery and had another follow-up appointment earlier on the day of presentation where he was noted to have worsening left foot ulcer.  Patient was advised to come to the ED for IV antibiotics therapy.  Reports constant pain in his left foot.  ED Course: Temperature 99.2.  BP 180/104, pulse 86, respiration rate 19, O2 saturation 100% on room air.  Lab studies remarkable for serum sodium 127, serum glucose 575, BUN 29, creatinine 2.19, serum bicarb 22, anion gap 10.  Troponin 16, 10.  Lactic acid 2.0.  WBC 8.7, hemoglobin 10.6, MCV 85, platelet count 316.  Review of Systems: Review of systems as noted in the HPI. All other systems reviewed and are negative.   Past Medical History:  Diagnosis Date   Abscess of right foot    abscess/ulcer of R transtibial amputation requiring IV abx   AIDS (Keith [name])    Anemia    CAD (coronary artery disease)    a. MI with stenting of OM1 in 11/2018 with residual disease. b. acute STEMI 03/2020 s/p DES to OM1   Chronic anemia    Chronic knee pain    right   Chronic pain    CKD (chronic kidney disease), stage IV (HCC)    CVA (cerebral vascular accident) (Keith [name]) 04/2020   Diabetes type 2, uncontrolled (Keith [name])    HgA1c 17.6 (04/27/2010)    Diabetic foot ulcer (Keith [name]) 01/2017   right foot   Dilatation of aorta (HCC)    Erectile dysfunction    Genital warts    HIV (human immunodeficiency virus infection) (Keith [name]) 2009   CD4 count 100, VL 13800 (05/01/2010)   Hyperlipidemia    Hypertension    Noncompliance with medication regimen    Osteomyelitis (Keith [name])    h/o hand   Osteomyelitis of metatarsal (Keith [name]) 04/28/2017   Past Surgical History:  Procedure Laterality Date   AMPUTATION Right 05/02/2017   Procedure: AMPUTATION TRANSMETARSAL;  Surgeon: [name], MD;  Location: [facility];  Service: Orthopedics;  Laterality: Right;   AMPUTATION Right 06/13/2017   Procedure: RIGHT BELOW KNEE AMPUTATION;  Surgeon: [name], MD;  Location: [facility];  Service: Orthopedics;  Laterality: Right;   BELOW KNEE LEG AMPUTATION Right 06/13/2017   BELOW KNEE LEG AMPUTATION     CORONARY STENT INTERVENTION N/A 11/10/2018   Procedure: CORONARY STENT INTERVENTION;  Surgeon: Leonie Man, MD;  Location: [facility];  Service: Cardiovascular;  Laterality: N/A;   CORONARY/GRAFT ACUTE MI REVASCULARIZATION N/A 03/21/2020   Procedure: Coronary/Graft Acute MI Revascularization;  Surgeon: [name], MD;  Location: [place] [facility];  Service: Cardiovascular;  Laterality: N/A;   HERNIA REPAIR     I & D EXTREMITY Left 08/21/2014   Procedure: INCISION AND DRAINAGE LEFT SMALL FINGER;  Surgeon: Leanora Cover,  MD;  [facility]: [name];  Service: Orthopedics;  Laterality: Left;   I & D EXTREMITY Right 03/18/2017   Procedure: IRRIGATION AND DEBRIDEMENT EXTREMITY;  Surgeon: [name], MD;  [facility]: [name];  Service: Orthopedics;  Laterality: Right;   LEFT HEART CATH AND CORONARY ANGIOGRAPHY N/A 11/10/2018   Procedure: LEFT HEART CATH AND CORONARY ANGIOGRAPHY;  Surgeon: Leonie Man, MD;  Location: [facility];  Service: Cardiovascular;  Laterality: N/A;   LEFT HEART CATH AND CORONARY ANGIOGRAPHY N/A 03/21/2020   Procedure: LEFT HEART CATH AND  CORONARY ANGIOGRAPHY;  Surgeon: [name], MD;  Location: [facility];  Service: Cardiovascular;  Laterality: N/A;   MINOR IRRIGATION AND DEBRIDEMENT OF WOUND Right 04/22/2014   Procedure: IRRIGATION AND DEBRIDEMENT OF RIGHT NECK ABCESS;  Surgeon: Jerrell Belfast, MD;  [facility]: [name];  Service: ENT;  Laterality: Right;   MULTIPLE EXTRACTIONS WITH ALVEOLOPLASTY N/A 01/18/2013   Procedure: MULTIPLE EXTRACION 3, 6, 7, 10, 11, 13, 21, 22, 27, 28, 29, 30 WITH ALVEOLOPLASTY;  Surgeon: Gae Bon, DDS;  [facility]: [name];  Service: Oral Surgery;  Laterality: N/A;   SKIN SPLIT GRAFT Right 03/21/2017   Procedure: IRRIGATION AND DEBRIDEMENT RIGHT FOOT AND APPLY SPLIT THICKNESS SKIN GRAFT AND WOUND VAC;  Surgeon: [name], MD;  [facility]: [name];  Service: Orthopedics;  Laterality: Right;   TEE WITHOUT CARDIOVERSION N/A 05/02/2017   Procedure: TRANSESOPHAGEAL ECHOCARDIOGRAM (TEE);  Surgeon: Acie Fredrickson Wonda Cheng, MD;  [name];  Service: Cardiovascular;  Laterality: N/A;  coincidental to orthopedic case    Social History:  reports that he has never smoked. He has never used smokeless tobacco. He reports previous alcohol use. He reports previous drug use.   Allergies  Allergen Reactions   Elemental Sulfur Itching    Patient stated he's allergic to "sulfur" AND "sulfa"   Sulfa Antibiotics Itching    Family History  Problem Relation Age of Onset   Hypertension Mother    Arthritis Father    Hypertension Father    Hypertension Brother    Cancer Maternal Grandmother 85       unknown type of cancer   Depression Paternal Grandmother       Prior to Admission medications   Medication Sig Start Date End Date Taking? Authorizing Provider  acetaminophen (TYLENOL) 325 MG tablet Take 2 tablets (650 mg total) by mouth every 4 (four) hours as needed for mild pain, fever or headache. 05/03/20   Debbe Odea, MD  allopurinol (ZYLOPRIM) 300 MG tablet Take 300 mg by mouth daily. 06/09/20    [provider]  ALPRAZolam [name]) 1 MG tablet Take 1 mg by mouth daily as needed for anxiety. 08/15/20   [provider]  amLODipine (NORVASC) 10 MG tablet Take 1 tablet (10 mg total) by mouth daily. 09/01/20 10/31/20  [name], MD  aspirin 81 MG EC tablet Take 1 tablet (81 mg total) by mouth daily. 03/27/20   [name], MD  atorvastatin (LIPITOR) 80 MG tablet Take 1 tablet (80 mg total) by mouth daily. 09/01/20 10/31/20  [name], MD  bictegravir-emtricitabine-tenofovir AF (BIKTARVY) 50-200-25 MG TABS tablet Take 1 tablet by mouth daily. 05/19/20   [name], MD  blood glucose meter kit and supplies Dispense based on patient and insurance preference. Use up to four times daily as directed. (FOR ICD-10 E10.9, E11.9). 10/17/20   Keith Francois, NP  carvedilol (COREG) 25 MG tablet Take 1 tablet (25 mg  total) by mouth 2 (two) times daily with a meal. 09/01/20 10/31/20  [name], MD  dapsone 100 MG tablet TAKE 1 TABLET (100 MG TOTAL) BY MOUTH DAILY. 03/27/20 03/27/21  [name], Ava, DO  doxycycline (VIBRAMYCIN) 100 MG capsule Take 1 capsule (100 mg total) by mouth 2 (two) times daily. One po bid x 7 days 10/07/20   Sherwood Gambler, MD  escitalopram (LEXAPRO) 20 MG tablet Take 20 mg by mouth daily. 08/15/20   [provider]  gabapentin (NEURONTIN) 100 MG capsule Take 1 capsule (100 mg total) by mouth 3 (three) times daily. 09/01/20 10/31/20  [name], MD  glucose blood (COOL BLOOD GLUCOSE TEST STRIPS) test strip Use as instructed 02/16/20   [name], FNP  icosapent Ethyl (VASCEPA) 1 g capsule TAKE 2 CAPSULES (2 G TOTAL) BY MOUTH TWO TIMES DAILY. 03/27/20 03/27/21  [name], MD  insulin aspart protamine- aspart (NOVOLOG MIX 70/30) (70-30) 100 UNIT/ML injection Inject 0.3 mLs (30 Units total) into the skin 2 (two) times daily with a meal. With breakfast and dinner. 10/01/20 12/30/20  Enzo Bi, MD  Lancets [facility] ULTRASOFT) lancets Use as instructed  02/16/20   [name], FNP  metFORMIN (GLUCOPHAGE) 1000 MG tablet Take 1,000 mg by mouth 2 (two) times daily. 09/05/20   [provider]  nitroGLYCERIN (NITROSTAT) 0.4 MG SL tablet PLACE 1 TABLET (0.4 MG TOTAL) UNDER THE TONGUE EVERY FIVE MINUTES X 3 DOSES AS NEEDED FOR CHEST PAIN. 03/27/20 03/27/21  [name], Ava, DO  NOVOLOG MIX 70/30 FLEXPEN (70-30) 100 UNIT/ML FlexPen Inject 30 Units into the skin 2 (two) times daily. 10/01/20   [provider]  omega-3 acid ethyl esters (LOVAZA) 1 g capsule Take 1 g by mouth 2 (two) times daily.    [provider]  oxybutynin (DITROPAN) 5 MG tablet TAKE 1 TABLET (5 MG TOTAL) BY MOUTH TWO TIMES DAILY. 03/27/20 03/27/21  [name], MD  oxyCODONE 10 MG TABS Take 1 tablet (10 mg total) by mouth every 6 (six) hours as needed for severe pain (Severe pain.). 05/03/20   Debbe Odea, MD  traZODone (DESYREL) 100 MG tablet Take 100 mg by mouth at bedtime. 09/11/20   [provider]    Physical Exam: BP (!) 165/103   Pulse 81   Temp 98.6 F (37 C) (Oral)   Resp 20   Ht _0  (1.778 m)   Wt 127 kg   SpO2 97%   BMI 40.17 kg/m   General: 55 y.o. year-old male well developed well nourished in no acute distress.  Alert and oriented x3. Cardiovascular: Regular rate and rhythm with no rubs or gallops.  No thyromegaly or JVD noted.  No lower extremity edema. 2/4 pulses in all 4 extremities. Respiratory: Clear to auscultation with no wheezes or rales. Good inspiratory effort. Abdomen: Soft nontender nondistended with normal bowel sounds x4 quadrants. Muskuloskeletal: R BKA, left foot edematous with ulcer. Neuro: CN II-XII intact, strength, sensation, reflexes Skin:    Psychiatry: Judgement and insight appear normal. Mood is appropriate for condition and setting          Labs on Admission:  Basic Metabolic Panel: Recent Labs  Lab 10/17/20 1807 10/18/20 0405 10/18/20 0436  NA 127*  --   --   K 4.6  --   --   CL  95*  --   --   CO2 22  --   --   GLUCOSE 575*  --   --  BUN 29*  --   --   CREATININE 2.19* 2.19*  --   CALCIUM 8.7*  --   --   MG  --   --  1.9  PHOS  --   --  3.9   Liver Function Tests: Recent Labs  Lab 10/17/20 1807  AST 15  ALT 13  ALKPHOS 85  BILITOT 0.7  PROT 7.8  ALBUMIN 2.4*   No results for input(s): LIPASE, AMYLASE in the last 168 hours. No results for input(s): AMMONIA in the last 168 hours. CBC: Recent Labs  Lab 10/17/20 1807  WBC 8.7  NEUTROABS 6.4  HGB 10.6*  HCT 34.0*  MCV 85.0  PLT 316   Cardiac Enzymes: No results for input(s): CKTOTAL, CKMB, CKMBINDEX, TROPONINI in the last 168 hours.  BNP (last 3 results) No results for input(s): BNP in the last 8760 hours.  ProBNP (last 3 results) No results for input(s): PROBNP in the last 8760 hours.  CBG: Recent Labs  Lab 10/18/20 0404  GLUCAP 563*    Radiological Exams on Admission: DG Foot Complete Left  Result Date: 10/17/2020 CLINICAL DATA:  Pt c/o left heel wound x several weeks; pt has been seen at the wound center. [name] sent the patient to the ED tonight. Hx of DM. No hx of prior injuries or surgeries. Patient could not internally rotate his left leg due t.*comment was truncated*Ulcer on the calcaneus. EXAM: LEFT FOOT - COMPLETE 3+ VIEW COMPARISON:  None. FINDINGS: There is soft tissue ulceration along the plantar surface of the foot adjacent to the calcaneus. There is gas within this ulceration which measures approximately 1 cm in depth. No evidence of erosion of the adjacent calcaneus. There is soft tissue swelling in the forefoot on the dorsal surface. In comparison to radiograph 10/07/2020 the soft tissue ulceration is increased in size measuring 5 cm x 1 cm compared to 2.3 by 1.1 cm. IMPRESSION: 1. Increase in volume of the soft tissue ulceration along the plantar surface of the calcaneus. 2. No evidence of osteomyelitis. 3. Soft tissue swelling of the forefoot. Electronically Signed   By:  Suzy Bouchard M.D.   On: 10/17/2020 19:44    EKG: I independently viewed the EKG done and my findings are as followed: None available at the time of this dictation.  Assessment/Plan Present on Admission:  Cellulitis  Active Problems:   Cellulitis  Left foot ulcer with concern for cellulitis, POA Failed outpatient therapy with 7-day course of doxycycline, started on 10/07/2020. Started on IV vancomycin in the ED Add cefepime and IV Flagyl. Please consult orthopedic surgery in the morning. Local wound care specialist No osteomyelitis or drainage abscess identified from MRI of the left foot done on 10/07/2020.  Large heel ulcer below the posterior calcaneus.  Dorsal subcutaneous edema in the ankle and foot tracking into the toes cellulitis is not excluded. Obtain ESR and CRP levels MRSA screening Follow blood cultures x 2 Analgesics and bowel regimen as needed.  Uncontrolled diabetes with hyperglycemia Obtain hemoglobin A1c Start insulin sliding scale Started Lantus 10 unit daily. Hold off home oral hypoglycemics  Diabetes polyneuropathy Resume home regimen.  HIV Resume home HAART regimen  Chronic anxiety/depression Resume home regimen.  S/p R BKA with ambulatory dysfunction PT OT assessment Patient has expressed his preference to discharge to rehab facility at time of discharge rather than home.  He states that his mother is 43 years old and is having difficulty assisting him with wound care.  CKD  3B At baseline Monitor UO Avoid nephrotoxic agents IV fluid hydration    DVT prophylaxis: Subcu Lovenox daily  Code Status: Full code  Family Communication: None at bedside  Disposition Plan: Admit to telemetry surgical  Consults called: Please contact orthopedic surgery in the morning.  Admission status: Inpatient status.  Patient will require at least 2 midnights for further evaluation and treatment of present condition.   Status is:  Inpatient    Dispo:  Patient From: Home  Planned Disposition: [place]  Medically stable for discharge: No         Kayleen Memos MD [facility] Pager [phone]  If 7PM-7AM, please contact night-coverage [email or web address] Password [facility]  10/18/2020, 6:13 AM

## 2020-10-18 NOTE — Progress Notes (Signed)
Received report from [facility]. Room ready for patient.

## 2020-10-18 NOTE — Progress Notes (Signed)
New Admission Note:   Arrival Method: stretcher Mental Orientation: alertx4 Telemetry: box 22 Assessment: Completed Skin: ee flow sheet IV:NSL Pain: none Tubes: None Safety Measures: Safety Fall Prevention Plan has been discussed Admission: Completed [facility] Orientation: Patient has been orientated to the room, unit and staff.  Family: none at bedside  Orders have been reviewed and implemented. Will continue to monitor the patient. Call light has been placed within reach and bed alarm has been activated.   Rockie Neighbours BSN, RN Phone number: [phone]

## 2020-10-19 DIAGNOSIS — L03116 Cellulitis of left lower limb: Secondary | ICD-10-CM | POA: Diagnosis not present

## 2020-10-19 LAB — MAGNESIUM: Magnesium: 1.8 mg/dL (ref 1.7–2.4)

## 2020-10-19 LAB — GLUCOSE, CAPILLARY
Glucose-Capillary: 243 mg/dL — ABNORMAL HIGH (ref 70–99)
Glucose-Capillary: 207 mg/dL — ABNORMAL HIGH (ref 70–99)
Glucose-Capillary: 176 mg/dL — ABNORMAL HIGH (ref 70–99)
Glucose-Capillary: 244 mg/dL — ABNORMAL HIGH (ref 70–99)

## 2020-10-19 LAB — CBC
HCT: 30.3 % — ABNORMAL LOW (ref 39.0–52.0)
Hemoglobin: 9.8 g/dL — ABNORMAL LOW (ref 13.0–17.0)
MCH: 26.8 pg (ref 26.0–34.0)
MCHC: 32.3 g/dL (ref 30.0–36.0)
MCV: 82.8 fL (ref 80.0–100.0)
Platelets: 341 10*3/uL (ref 150–400)
RBC: 3.66 MIL/uL — ABNORMAL LOW (ref 4.22–5.81)
RDW: 12.6 % (ref 11.5–15.5)
WBC: 9.1 10*3/uL (ref 4.0–10.5)
nRBC: 0 % (ref 0.0–0.2)

## 2020-10-19 LAB — BASIC METABOLIC PANEL
Anion gap: 9 (ref 5–15)
BUN: 25 mg/dL — ABNORMAL HIGH (ref 6–20)
CO2: 21 mmol/L — ABNORMAL LOW (ref 22–32)
Calcium: 8.9 mg/dL (ref 8.9–10.3)
Chloride: 102 mmol/L (ref 98–111)
Creatinine, Ser: 2.12 mg/dL — ABNORMAL HIGH (ref 0.61–1.24)
GFR, Estimated: 36 mL/min — ABNORMAL LOW (ref >60)
Glucose, Bld: 233 mg/dL — ABNORMAL HIGH (ref 70–99)
Potassium: 4.3 mmol/L (ref 3.5–5.1)
Sodium: 132 mmol/L — ABNORMAL LOW (ref 135–145)

## 2020-10-19 MED ORDER — METRONIDAZOLE 500 MG PO TABS
500.0000 mg | ORAL_TABLET | Freq: Two times a day (BID) | ORAL | Status: DC
  Administered 2020-10-19 – 2020-10-28 (×19): 500 mg via ORAL
  Filled 2020-10-19 (×19): qty 1

## 2020-10-19 NOTE — Progress Notes (Signed)
PT Cancellation Note  Patient Details Name: Keith [name] MRN: [id number] DOB: 06/06/1965   Cancelled Treatment:    Reason Eval/Treat Not Completed: Patient declined, no reason specified;Other (comment).  Pt reporting nausea this PM.  Would like PT to check back tomorrow.  PT did relay that per OT Dr. Avon Gully has approved WB on left foot with walking PRAFO donned.    Thanks,  Verdene Lennert, PT, DPT  Acute Rehabilitation Ortho Tech Supervisor [phone] pager #[phone] office      Wells Guiles B Chandler Swiderski 10/19/2020, 3:23 PM

## 2020-10-19 NOTE — Progress Notes (Signed)
Rehab Admissions Coordinator Note:  Patient was screened by Cleatrice Burke for appropriateness for an Inpatient Acute Rehab Consult per therapy recs form HH, to SNF, to Cir. At this time, we are recommending Inpatient Rehab consult. I will place order per protocol.  Cleatrice Burke RN MSN 10/19/2020, 5:57 PM  I can be reached at [phone].

## 2020-10-19 NOTE — Plan of Care (Signed)
  Problem: Clinical Measurements: Goal: Ability to avoid or minimize complications of infection will improve Outcome: Progressing   Problem: Education: Goal: Knowledge of General Education information will improve Description: Including pain rating scale, medication(s)/side effects and non-pharmacologic comfort measures Outcome: Progressing   Problem: Health Behavior/Discharge Planning: Goal: Ability to manage health-related needs will improve Outcome: Progressing

## 2020-10-19 NOTE — Progress Notes (Signed)
Inpatient Diabetes Program Recommendations  AACE/ADA: New Consensus Statement on Inpatient Glycemic Control (2015)  Target Ranges:  Prepandial:   less than 140 mg/dL      Peak postprandial:   less than 180 mg/dL (1-2 hours)      Critically ill patients:  140 - 180 mg/dL   Lab Results  Component Value Date   GLUCAP 207 (H) 10/19/2020   HGBA1C 13.9 (H) 08/29/2020    Review of Glycemic Control Results for [name], [name] (MRN [name]) as of 10/19/2020 13:32  Ref. Range 10/18/2020 06:35 10/18/2020 07:47 10/18/2020 10:57 10/18/2020 16:26 10/18/2020 21:22 10/19/2020 06:45 10/19/2020 12:10  Glucose-Capillary Latest Ref Range: 70 - 99 mg/dL 444 (H) 405 (H) 375 (H) 179 (H) 160 (H) 243 (H) 207 (H)   Diabetes history: DM 2 Outpatient Diabetes medications:  Novolog 70/30 30 units bid Metformin 1000 mg bid Current orders for Inpatient glycemic control:  Novolog moderate tid with meals and HS Lantus 10 units daily Inpatient Diabetes Program Recommendations:   Agree with current orders. Will follow.   Thanks,  Adah Perl, RN, BC-ADM Inpatient Diabetes Coordinator Pager [phone] (8a-5p)

## 2020-10-19 NOTE — Progress Notes (Addendum)
Ok to get an updated HIV VL while here per Dr. Avon Gully. Change Flagyl to PO per protocol.   Onnie Boer, PharmD, BCIDP, AAHIVP, CPP Infectious Disease Pharmacist 10/19/2020 1:43 PM

## 2020-10-19 NOTE — Plan of Care (Signed)
  Problem: Clinical Measurements: Goal: Ability to avoid or minimize complications of infection will improve Outcome: Progressing   Problem: Education: Goal: Knowledge of General Education information will improve Description: Including pain rating scale, medication(s)/side effects and non-pharmacologic comfort measures Outcome: Progressing   Problem: Clinical Measurements: Goal: Diagnostic test results will improve Outcome: Progressing

## 2020-10-19 NOTE — Evaluation (Addendum)
Occupational Therapy Evaluation Patient Details Name: Keith [name] MRN: [name] DOB: [birth year] Today's Date: 10/19/2020    History of Present Illness Keith [name] is a 55 y.o. male  who is sent to the ED by his orthopedic surgery provider due to concern for left heel ulcer that had failed outpatient therapy. with medical history significant for HIV, CKD 3B, type 2 diabetes, hypertension, hyperlipidemia, coronary artery disease, status post right below the knee amputation,   Clinical Impression   Pt admitted with the above diagnoses and presents with below problem list. Pt will benefit from continued acute OT to address the below listed deficits and maximize independence with basic ADLs prior to d/c venue below, TBD pending progress. At baseline, pt is mod I with ADLs; utilizes rw and RLE prosthetic. Currently pt needs up to min +2 assist for safety with anterior-posterior transfers, up to max A with LB ADLs in sitting/lateral lean position, min A with UB bathing. Pt now has LLE walking PRAFO and notified after OT session that, per attending physician, pt is ok to bear weight through LLE.      Follow Up Recommendations  Supervision/Assistance - 24 hour;CIR;SNF ; pending progress once able to assess with with clearance to WB through LLE with walking PRAFO on   Equipment Recommendations  Other (comment) (TBD pending d/c plan)    Recommendations for Other Services       Precautions / Restrictions Precautions Precautions: Fall Precaution Comments: R BKA Required Braces or Orthoses: Other Brace Other Brace: LLE walking PRAFO Restrictions Weight Bearing Restrictions: Yes LLE Weight Bearing: Non weight bearing Other Position/Activity Restrictions: Will keep pt NWB LLE unless otherwise ordered      Mobility Bed Mobility Overal bed mobility: Modified Independent             General bed mobility comments: extra time and effort to go from long sitting to supine in middle of the  bed. utilized upper side and headboard rails to scoot up in bed.    Transfers Overall transfer level: Needs assistance   Transfers: Comptroller transfers: Min assist;+2 safety/equipment   General transfer comment: min A +2 for safety and use of bed pad to transfer from slightly lower recliner seat into bed. Extra time and effort, cues for technique.    Balance                                           ADL either performed or assessed with clinical judgement   ADL Overall ADL's : Needs assistance/impaired Eating/Feeding: Set up;Sitting   Grooming: Set up;Sitting   Upper Body Bathing: Minimal assistance;Sitting   Lower Body Bathing: Maximal assistance;Sitting/lateral leans   Upper Body Dressing : Set up;Sitting   Lower Body Dressing: Maximal assistance;Sitting/lateral leans   Toilet Transfer: Minimal assistance;+2 for safety/equipment;Anterior/posterior   Toileting- Water quality scientist and Hygiene: Moderate assistance;Sitting/lateral lean         General ADL Comments: needs supported sitting for UB ADLs. Completed a-p transfer from recliner to bed with min A +2 for safety. Needs cues for technique/sequencing movements. Fatigue noted.     Vision         Perception     Praxis      Pertinent Vitals/Pain Pain Assessment: Faces Faces Pain Scale: Hurts little more Pain Location: L LE, foot, heel during a-p transfer Pain  Descriptors / Indicators: Grimacing;Discomfort Pain Intervention(s): Monitored during session;Repositioned     Hand Dominance Right   Extremity/Trunk Assessment Upper Extremity Assessment Upper Extremity Assessment: Overall WFL for tasks assessed;Generalized weakness   Lower Extremity Assessment Lower Extremity Assessment: Defer to PT evaluation       Communication Communication Communication: No difficulties   Cognition Arousal/Alertness: Awake/alert Behavior During Therapy:  WFL for tasks assessed/performed Overall Cognitive Status: Within Functional Limits for tasks assessed                                     General Comments       Exercises     Shoulder Instructions      Home Living Family/patient expects to be discharged to:: Private residence Living Arrangements: Parent Available Help at Discharge: Friend(s);Available PRN/intermittently Type of Home: House Home Access: Stairs to enter [employer] of Steps: 3 Entrance Stairs-Rails: Right;Left Home Layout: One level     Bathroom Shower/Tub: Teacher, early years/pre: Standard     Home Equipment: Environmental consultant - 2 wheels;Shower seat          Prior Functioning/Environment Level of Independence: Independent with assistive device(s)        Comments: uses prosthetic and mobilizes with walker.        OT Problem List: Decreased strength;Decreased activity tolerance;Impaired balance (sitting and/or standing);Decreased safety awareness;Decreased knowledge of use of DME or AE;Decreased knowledge of precautions;Obesity;Pain      OT Treatment/Interventions: Self-care/ADL training;Therapeutic exercise;DME and/or AE instruction;Therapeutic activities;Patient/family education;Balance training    OT Goals(Current goals can be found in the care plan section) Acute Rehab OT Goals Patient Stated Goal: Hopes to be able to go to SNF for rehab OT Goal Formulation: With patient Time For Goal Achievement: 11/02/20 Potential to Achieve Goals: Good ADL Goals Pt Will Perform Upper Body Dressing: with set-up;sitting Pt Will Perform Lower Body Dressing: with min assist;sitting/lateral leans Pt Will Transfer to Toilet: with min assist;with transfer board;stand pivot transfer;squat pivot transfer Pt Will Perform Toileting - Clothing Manipulation and hygiene: with set-up;sitting/lateral leans  OT Frequency: Min 2X/week   Barriers to D/C:    pt lives with 40 yo mother who is  unable to assist pt       Co-evaluation              AM-PAC OT "6 Clicks" Daily Activity     Outcome Measure Help from another person eating meals?: None Help from another person taking care of personal grooming?: A Little Help from another person toileting, which includes using toliet, bedpan, or urinal?: A Lot Help from another person bathing (including washing, rinsing, drying)?: A Lot Help from another person to put on and taking off regular upper body clothing?: A Little Help from another person to put on and taking off regular lower body clothing?: A Lot 6 Click Score: 16   End of Session Equipment Utilized During Treatment: Other (comment) (LLE walking PRAFO) Nurse Communication: Other (comment) (NT assisted with a-p transfer)  Activity Tolerance: Patient tolerated treatment well;Patient limited by fatigue Patient left: in bed;with call bell/phone within reach;with bed alarm set  OT Visit Diagnosis: Other abnormalities of gait and mobility (R26.89);Muscle weakness (generalized) (M62.81);Pain Pain - Right/Left: Left Pain - part of body: Ankle and joints of foot;Leg                Time: 1000-1018 OT Time Calculation (min): 18 min Charges:  OT General Charges $OT Visit: 1 Visit OT Evaluation $OT Eval Moderate Complexity: [facility], OT Acute Rehabilitation Services Pager: [phone] Office: [phone]   Hortencia Pilar 10/19/2020, 10:45 AM

## 2020-10-19 NOTE — Progress Notes (Signed)
PROGRESS NOTE    Keith [name]  TWS:[id number] DOB: 1966-04-02 DOA: 10/17/2020 [name], NP   Brief Narrative:  Keith [name] is a 55 y.o. male with medical history significant for HIV, CKD 3B, type 2 diabetes, hypertension, hyperlipidemia, coronary artery disease, status post right below the knee amputation, who is sent to the ED by his orthopedic surgery provider due to concern for left heel ulcer that had failed outpatient therapy.  Associated with subjective fevers.  Patient was seen in the ED on 10/07/2020 and completed 7 days of oral doxycycline.  He followed up with orthopedic surgery and had another follow-up appointment earlier on the day of presentation where he was noted to have worsening left foot ulcer.  Patient was advised to come to the ED for IV antibiotics therapy.  Reports constant pain in his left foot.  Assessment & Plan:   Active Problems:   Cellulitis  Left foot ulcer with concern for cellulitis, POA Failed outpatient therapy with 7-day course of doxycycline, started on 10/07/2020 Continue cefepime and IV Flagyl. Patient sent in from orthopedic office (Dr Sharol Given) - appreciate insight and recs - will continue IV abx through the weekend and follow cultlures; no need for repeat imagine in the interim per discussion with ortho today Wound care following No osteomyelitis or drainage abscess identified from MRI of the left foot done on 10/07/2020.  Large heel ulcer below the posterior calcaneus.  Dorsal subcutaneous edema in the ankle and foot tracking into the toes cellulitis is not excluded. ESR 120; CRP 11 at intake Follow cultures   Profoundly uncontrolled diabetes with hyperglycemia A1C 14 - glucose close to 500 overnight Increase sliding scale; follow hypoglycemia protocol  Diabetes polyneuropathy Resume home regimen.   HIV Resume home HAART regimen Repeat labs for HAART regimen pending per pharmacy  Chronic anxiety/depression Resume home  regimen.  S/p R BKA with ambulatory dysfunction PT OT to follow   CKD 3B At baseline  DVT prophylaxis: Subcu Lovenox daily Code Status: Full code Family Communication: None at bedside Status is: inpt  Dispo: The patient is from: Home              Anticipated d/c is to: TBD              Anticipated d/c date is: 72+h              Patient currently NOT medically stable for discharge  Consultants:  Orthopedic Surgery  Procedures:  TBD  Antimicrobials:  Cefepime/Metronidazole   Subjective: No acute issues/events overnight - pain ongoing but improving - denies headache fevers chills chest pain nausea vomiting diarrhea or constipation  Objective: Vitals:   10/18/20 1623 10/18/20 1915 10/18/20 2122 10/19/20 0456  BP: 121/69 (!) 153/96 (!) 148/86 (!) 134/93  Pulse: 72 72 71 82  Resp: $Remo'16 18 18 18  'IeJPr$ Temp: 99.6 F (37.6 C) 98.4 F (36.9 C) 98.4 F (36.9 C) 97.9 F (36.6 C)  TempSrc: Oral Oral  Oral  SpO2: 97% 97% 100% 100%  Weight:   127 kg   Height:        Intake/Output Summary (Last 24 hours) at 10/19/2020 0817 Last data filed at 10/19/2020 0630 Gross per 24 hour  Intake 517 ml  Output 2950 ml  Net -2433 ml   Filed Weights   10/17/20 1800 10/18/20 2122  Weight: 127 kg 127 kg    Examination: General: 55 y.o. year-old male well developed well nourished in no acute distress.  Alert and oriented x3. Cardiovascular: Regular rate and rhythm with no rubs or gallops.  No thyromegaly or JVD noted.  No lower extremity edema. 2/4 pulses in all 4 extremities. Respiratory: Clear to auscultation with no wheezes or rales. Good inspiratory effort. Abdomen: Soft nontender nondistended with normal bowel sounds x4 quadrants. Muskuloskeletal: R BKA, left lower extremity 2+ pitting edema to knee with unstageable heel ulcer(see below) Neuro: CN II-XII intact, strength, sensation, reflexes Foot as of 10/17/20 - currently in boot today    Psychiatry: Judgement and insight appear  normal. Mood is appropriate for condition and setting     Data Reviewed: I have personally reviewed following labs and imaging studies  CBC: Recent Labs  Lab 10/17/20 1807 10/18/20 0805 10/19/20 0333  WBC 8.7 7.5 9.1  NEUTROABS 6.4  --   --   HGB 10.6* 10.4* 9.8*  HCT 34.0* 32.3* 30.3*  MCV 85.0 82.6 82.8  PLT 316 322 366    Basic Metabolic Panel: Recent Labs  Lab 10/17/20 1807 10/18/20 0405 10/18/20 0436 10/18/20 0805 10/19/20 0333  NA 127*  --   --  129* 132*  K 4.6  --   --  4.5 4.3  CL 95*  --   --  98 102  CO2 22  --   --  21* 21*  GLUCOSE 575*  --   --  427* 233*  BUN 29*  --   --  25* 25*  CREATININE 2.19* 2.19*  --  1.96* 2.12*  CALCIUM 8.7*  --   --  8.8* 8.9  MG  --   --  1.9  --  1.8  PHOS  --   --  3.9  --   --     GFR: Estimated Creatinine Clearance: 52.7 mL/min (A) (by C-G formula based on SCr of 2.12 mg/dL (H)). Liver Function Tests: Recent Labs  Lab 10/17/20 1807  AST 15  ALT 13  ALKPHOS 85  BILITOT 0.7  PROT 7.8  ALBUMIN 2.4*    No results for input(s): LIPASE, AMYLASE in the last 168 hours. No results for input(s): AMMONIA in the last 168 hours. Coagulation Profile: No results for input(s): INR, PROTIME in the last 168 hours. Cardiac Enzymes: No results for input(s): CKTOTAL, CKMB, CKMBINDEX, TROPONINI in the last 168 hours. BNP (last 3 results) No results for input(s): PROBNP in the last 8760 hours. HbA1C: No results for input(s): HGBA1C in the last 72 hours. CBG: Recent Labs  Lab 10/18/20 0747 10/18/20 1057 10/18/20 1626 10/18/20 2122 10/19/20 0645  GLUCAP 405* 375* 179* 160* 243*    Lipid Profile: No results for input(s): CHOL, HDL, LDLCALC, TRIG, CHOLHDL, LDLDIRECT in the last 72 hours. Thyroid Function Tests: No results for input(s): TSH, T4TOTAL, FREET4, T3FREE, THYROIDAB in the last 72 hours. Anemia Panel: No results for input(s): VITAMINB12, FOLATE, FERRITIN, TIBC, IRON, RETICCTPCT in the last 72 hours. Sepsis  Labs: Recent Labs  Lab 10/17/20 1807 10/18/20 0418  LATICACIDVEN 2.0* 1.2     Recent Results (from the past 240 hour(s))  Blood culture (routine x 2)     Status: None (Preliminary result)   Collection Time: 10/17/20  6:30 PM   Specimen: BLOOD LEFT HAND  Result Value Ref Range Status   Specimen Description BLOOD LEFT HAND  Final   Special Requests   Final    BOTTLES DRAWN AEROBIC AND ANAEROBIC Blood Culture results may not be optimal due to an inadequate volume of blood received in culture bottles  Culture   Final    NO GROWTH < 12 HOURS Performed at [facility], [place] [address], [place], [place] [address]    Report Status PENDING  Incomplete  SARS CORONAVIRUS 2 (TAT 6-24 HRS) Nasopharyngeal Nasopharyngeal Swab     Status: None   Collection Time: 10/18/20  5:23 AM   Specimen: Nasopharyngeal Swab  Result Value Ref Range Status   SARS Coronavirus 2 NEGATIVE NEGATIVE Final    Comment: (NOTE) SARS-CoV-2 target nucleic acids are NOT DETECTED.  The SARS-CoV-2 RNA is generally detectable in upper and lower respiratory specimens during the acute phase of infection. Negative results do not preclude SARS-CoV-2 infection, do not rule out co-infections with other pathogens, and should not be used as the sole basis for treatment or other patient management decisions. Negative results must be combined with clinical observations, patient history, and epidemiological information. The expected result is Negative.  Fact Sheet for Patients: SugarRoll.be  Fact Sheet for Healthcare Providers: https://www.woods-mathews.com/  This test is not yet approved or cleared by the [place] FDA and  has been authorized for detection and/or diagnosis of SARS-CoV-2 by FDA under an Emergency Use Authorization (EUA). This EUA will remain  in effect (meaning this test can be used) for the duration of the COVID-19 declaration under Se ction 564(b)(1) of  the Act, 21 U.S.C. section 360bbb-3(b)(1), unless the authorization is terminated or revoked sooner.  Performed at [facility], [place] [address], [place], [place] [address]   MRSA Next Gen by PCR, Nasal     Status: None   Collection Time: 10/18/20  6:16 AM   Specimen: Nasal Mucosa; Nasal Swab  Result Value Ref Range Status   MRSA by PCR Next Gen NOT DETECTED NOT DETECTED Final    Comment: (NOTE) The GeneXpert MRSA Assay (FDA approved for NASAL specimens only), is one component of a comprehensive MRSA colonization surveillance program. It is not intended to diagnose MRSA infection nor to guide or monitor treatment for MRSA infections. Test performance is not FDA approved in patients less than 64 years old. Performed at [facility], [place] [address], [place], [place] [address]       Radiology Studies: MR FOOT LEFT W WO CONTRAST  Result Date: 10/18/2020 CLINICAL DATA:  Heel ulceration with swelling of the foot. Possible osteomyelitis. EXAM: MRI OF THE LEFT FOREFOOT WITHOUT AND WITH CONTRAST TECHNIQUE: Multiplanar, multisequence MR imaging of the left foot was performed both before and after administration of intravenous contrast. CONTRAST:  4mL GADAVIST GADOBUTROL 1 MMOL/ML IV SOLN COMPARISON:  Multiple exams, including 10/07/2020 and radiographs from 10/17/2020. FINDINGS: Osteomyelitis protocol MRI of the foot was obtained, to include the entire foot and ankle. This protocol uses a large field of view to cover the entire foot and ankle, and is suitable for assessing bony structures for osteomyelitis. Due to the large field of view and imaging plane choice, this protocol is less sensitive for assessing small structures such as ligamentous structures of the foot and ankle, compared to a dedicated forefoot or dedicated hindfoot exam. Bones/Joint/Cartilage Suspected early calcaneal osteomyelitis is observed with endosteal and adjacent marrow edema and enhancement along the  posteroinferior calcaneus adjacent to the large heel ulceration. This is substantially progressive compared to 10/07/2020. Strictly speaking, reactive findings could cause a similar appearance but the progression in bony appearance as well as progression in the ulceration are concerning for early osteomyelitis. No overt bony destructive findings. Small degenerative lesions along the first MTP joint  associated with spurring Ligaments Lisfranc ligament grossly intact. No large or prominent ligamentous derangement along the ankle. Muscles and Tendons Diffuse low-grade muscular edema and potentially low-grade enhancement, probably neurogenic. Potential mild proximal plantar fasciitis. Soft tissues Plantar ulceration below the calcaneus about 6.6 by 7.1 cm, with speckled low density in the soft tissues compatible with gas, and associated soft tissue hypoenhancement extending to the periosteal margin of the calcaneus and adjacent plantar fascia. Diffuse subcutaneous edema tracking in the dorsum of the foot and medially and laterally along the ankles suspicious for potential cellulitis. In the subcutaneous tissues superficial to the medial malleolus and medial flexor tendons, elongated bands of hypoenhancement measuring about 5.5 by 0.8 by 4.1 cm may represent incipient abscess formation although overt drainable abscess is not currently identified. IMPRESSION: 1. Progressive calcaneal ulceration associated with new endosteal and adjacent marrow edema and enhancement along the posteroinferior calcaneus suspicious for early osteomyelitis. 2. Subcutaneous edema medially and laterally in the ankle and also along the dorsum of the foot favoring cellulitis. Faint bands of hypoenhancement superficial to the medial malleolus may represent incipient abscess formation although currently a drainable abscess is not identified. 3. Probable neurogenic edema in the muscular tissues diffusely. 4. Potential mild plantar fasciitis. 5.  Degenerative findings at the first MTP joint. Electronically Signed   By: Van Clines M.D.   On: 10/18/2020 07:54   DG Foot Complete Left  Result Date: 10/17/2020 CLINICAL DATA:  Pt c/o left heel wound x several weeks; pt has been seen at the wound center. [name] sent the patient to the ED tonight. Hx of DM. No hx of prior injuries or surgeries. Patient could not internally rotate his left leg due t.*comment was truncated*Ulcer on the calcaneus. EXAM: LEFT FOOT - COMPLETE 3+ VIEW COMPARISON:  None. FINDINGS: There is soft tissue ulceration along the plantar surface of the foot adjacent to the calcaneus. There is gas within this ulceration which measures approximately 1 cm in depth. No evidence of erosion of the adjacent calcaneus. There is soft tissue swelling in the forefoot on the dorsal surface. In comparison to radiograph 10/07/2020 the soft tissue ulceration is increased in size measuring 5 cm x 1 cm compared to 2.3 by 1.1 cm. IMPRESSION: 1. Increase in volume of the soft tissue ulceration along the plantar surface of the calcaneus. 2. No evidence of osteomyelitis. 3. Soft tissue swelling of the forefoot. Electronically Signed   By: Suzy Bouchard M.D.   On: 10/17/2020 19:44    Scheduled Meds:  allopurinol  300 mg Oral Daily   amLODipine  10 mg Oral Daily   aspirin EC  81 mg Oral Daily   atorvastatin  80 mg Oral Daily   bictegravir-emtricitabine-tenofovir AF  1 tablet Oral Daily   carvedilol  25 mg Oral BID WC   dapsone  100 mg Oral Daily   enoxaparin (LOVENOX) injection  60 mg Subcutaneous Q24H   escitalopram  20 mg Oral Daily   gabapentin  100 mg Oral TID   icosapent Ethyl  2 g Oral BID   insulin aspart  0-15 Units Subcutaneous TID WC   insulin aspart  0-5 Units Subcutaneous QHS   insulin glargine  10 Units Subcutaneous Daily   oxybutynin  2.5 mg Oral BID   ticagrelor  90 mg Oral BID   Continuous Infusions:  ceFEPime (MAXIPIME) IV 2 g (10/19/20 0425)   metronidazole 500 mg  (10/19/20 0359)     LOS: 1 day   Time spent: 20min  Little Ishikawa, DO [facility]  If 7PM-7AM, please contact night-coverage [email or web address]  10/19/2020, 8:17 AM

## 2020-10-20 DIAGNOSIS — L03116 Cellulitis of left lower limb: Secondary | ICD-10-CM | POA: Diagnosis not present

## 2020-10-20 LAB — GLUCOSE, CAPILLARY
Glucose-Capillary: 275 mg/dL — ABNORMAL HIGH (ref 70–99)
Glucose-Capillary: 323 mg/dL — ABNORMAL HIGH (ref 70–99)
Glucose-Capillary: 186 mg/dL — ABNORMAL HIGH (ref 70–99)
Glucose-Capillary: 201 mg/dL — ABNORMAL HIGH (ref 70–99)

## 2020-10-20 LAB — BASIC METABOLIC PANEL
Anion gap: 8 (ref 5–15)
BUN: 24 mg/dL — ABNORMAL HIGH (ref 6–20)
CO2: 24 mmol/L (ref 22–32)
Calcium: 8.8 mg/dL — ABNORMAL LOW (ref 8.9–10.3)
Chloride: 98 mmol/L (ref 98–111)
Creatinine, Ser: 2.02 mg/dL — ABNORMAL HIGH (ref 0.61–1.24)
GFR, Estimated: 38 mL/min — ABNORMAL LOW (ref >60)
Glucose, Bld: 304 mg/dL — ABNORMAL HIGH (ref 70–99)
Potassium: 4.3 mmol/L (ref 3.5–5.1)
Sodium: 130 mmol/L — ABNORMAL LOW (ref 135–145)

## 2020-10-20 LAB — CBC
HCT: 32.2 % — ABNORMAL LOW (ref 39.0–52.0)
Hemoglobin: 10.4 g/dL — ABNORMAL LOW (ref 13.0–17.0)
MCH: 26.9 pg (ref 26.0–34.0)
MCHC: 32.3 g/dL (ref 30.0–36.0)
MCV: 83.2 fL (ref 80.0–100.0)
Platelets: 364 10*3/uL (ref 150–400)
RBC: 3.87 MIL/uL — ABNORMAL LOW (ref 4.22–5.81)
RDW: 12.9 % (ref 11.5–15.5)
WBC: 8.6 10*3/uL (ref 4.0–10.5)
nRBC: 0 % (ref 0.0–0.2)

## 2020-10-20 LAB — MAGNESIUM: Magnesium: 2 mg/dL (ref 1.7–2.4)

## 2020-10-20 MED ORDER — INSULIN GLARGINE 100 UNIT/ML ~~LOC~~ SOLN
5.0000 [IU] | Freq: Once | SUBCUTANEOUS | Status: AC
  Administered 2020-10-20: 5 [IU] via SUBCUTANEOUS
  Filled 2020-10-20: qty 0.05

## 2020-10-20 MED ORDER — INSULIN GLARGINE 100 UNIT/ML ~~LOC~~ SOLN
15.0000 [IU] | Freq: Every day | SUBCUTANEOUS | Status: DC
  Administered 2020-10-21: 15 [IU] via SUBCUTANEOUS
  Filled 2020-10-20 (×2): qty 0.15

## 2020-10-20 MED ORDER — LINEZOLID 600 MG PO TABS
600.0000 mg | ORAL_TABLET | Freq: Two times a day (BID) | ORAL | Status: DC
  Administered 2020-10-20 – 2020-10-28 (×16): 600 mg via ORAL
  Filled 2020-10-20 (×17): qty 1

## 2020-10-20 MED ORDER — ONDANSETRON HCL 4 MG/2ML IJ SOLN
4.0000 mg | Freq: Four times a day (QID) | INTRAMUSCULAR | Status: AC | PRN
  Administered 2020-10-20 (×2): 4 mg via INTRAVENOUS
  Filled 2020-10-20: qty 2

## 2020-10-20 NOTE — Progress Notes (Signed)
Pharmacy Antibiotic Note  Keith [name] is a 55 y.o. male admitted on 10/17/2020 with  wound infection .  Pt has been on doxycycline since 7/2 without improvement.  Pharmacy has been consulted for cefepime dosing.   Pt has been on cefepime and flagyl since 7/13. Wbc remains wnl. AF. MRI suggested early osteo.  D/w Dr Avon Gully and we will add empiric PO zyvox until he can be seen by [name] next week for assessment.   Additionally, ok to increase lantus to 15 units qday.   Plan: Cefepime 2g IV Q12H Add linezolid '600mg'$  PO BID   Height: '5\' 10"'$  (177.8 cm) Weight: 127 kg (280 lb) IBW/kg (Calculated) : 73  Temp (24hrs), Avg:98.3 F (36.8 C), Min:97.5 F (36.4 C), Max:98.9 F (37.2 C)  Recent Labs  Lab 10/17/20 1807 10/18/20 0405 10/18/20 0418 10/18/20 0805 10/19/20 0333 10/20/20 0333  WBC 8.7  --   --  7.5 9.1 8.6  CREATININE 2.19* 2.19*  --  1.96* 2.12* 2.02*  LATICACIDVEN 2.0*  --  1.2  --   --   --      Estimated Creatinine Clearance: 55.3 mL/min (A) (by C-G formula based on SCr of 2.02 mg/dL (H)).    Allergies  Allergen Reactions   Elemental Sulfur Itching    Patient stated he's allergic to "sulfur" AND "sulfa"   Sulfa Antibiotics Itching   7/2 doxy x 7 days 7/13 vanc x 1 7/13 cefepime>> 7/13 Flagyl>> 7/15 zyvox>>   7/13 BCx: ngtd  Onnie Boer, PharmD, [place], AAHIVP, CPP Infectious Disease Pharmacist 10/20/2020 1:24 PM

## 2020-10-20 NOTE — TOC Initial Note (Signed)
Transition of Care [facility]) - Initial/Assessment Note    Patient Details  Name: Keith [name] MRN: [name] Date of Birth: 03/29/1966  Transition of Care [facility]) CM/SW Contact:    Verdell Carmine, RN Phone Number: 10/20/2020, 11:34 AM  linical Narrative:                  Keith [name] is a patient readmitted after 2 weeks for a nonhealing foot ulcer, failed outpatient treatment. He lives at home with his mother, and was receiving [place] care through [place], [place] OT RN This was confirmed with Brent Bulla of [place].   Patient is recommended at this time for IP rehab, [facility] from [place] aware. CM will continue following for needs.   Expected Discharge Plan: IP Rehab Facility Barriers to Discharge: Continued Medical Work up   Patient Goals and CMS Choice        Expected Discharge Plan and Services Expected Discharge Plan: [place]   Discharge Planning Services: CM Consult Post Acute Care Choice: [place] arrangements for the past 2 months: Single Family Home                                      Prior Living Arrangements/Services Living arrangements for the past 2 months: Single Family Home Lives with:: Parents Patient language and need for interpreter reviewed:: Yes        Need for Family Participation in Patient Care: Yes (Comment) Care giver support system in place?: Yes (comment) Current home services: DME (has walker) Criminal Activity/Legal Involvement Pertinent to Current Situation/Hospitalization: No - Comment as needed  Activities of Daily Living      Permission Sought/Granted                  Emotional Assessment         Alcohol / Substance Use: Not Applicable Psych Involvement: No (comment)  Admission diagnosis:  Cellulitis [L03.90] Hyperglycemia [R73.9] Renal insufficiency [N28.9] Cellulitis of left foot [L03.116] Normochromic normocytic anemia [D64.9] Elevated blood pressure reading with diagnosis of  hypertension [I10] Diabetic ulcer of left heel associated with diabetes mellitus of other type, unspecified ulcer stage ([place]) [id number], L97.429] Patient Active Problem List   Diagnosis Date Noted   Cellulitis 10/18/2020   Atypical chest pain 123XX123   Acute metabolic encephalopathy 123XX123   Hyperglycemia due to diabetes mellitus ([place]) 08/29/2020   Hyperglycemia 08/29/2020   Acute cerebrovascular accident (CVA) due to ischemia ([place]) 04/28/2020   Type 2 diabetes mellitus with hyperlipidemia ([place])    PAD (peripheral artery disease) ([place])    Hypoglycemia 04/25/2020   Acute ST elevation myocardial infarction (STEMI) due to occlusion of circumflex coronary artery ([place]) 03/22/2020   Uncontrolled type 2 diabetes mellitus with hyperglycemia ([place]) 01/14/2019   Elevated glucose 01/14/2019   Hx of BKA, right ([place]) 01/14/2019   Pressure injury of skin 11/11/2018   STEMI (ST elevation myocardial infarction) ([place]) 11/10/2018   CKD (chronic kidney disease) stage 4, GFR 15-29 ml/min (HCC) 11/10/2018   Amputation stump infection ([place]) 10/28/2018   Type II diabetes mellitus with renal manifestations ([place]) 08/07/2018   Uncontrolled type 2 diabetes mellitus with hyperosmolar nonketotic hyperglycemia ([place]) 08/07/2018   Non-pressure chronic ulcer of right calf limited to breakdown of skin ([place]) 07/06/2018   Type 2 diabetes mellitus without complication, without long-term current use of insulin ([place]) 06/15/2018   Chronic low back pain without sciatica 06/15/2018  Idiopathic chronic venous hypertension of left lower extremity with ulcer and inflammation (HCC)    Venous stasis ulcer of left calf limited to breakdown of skin without varicose veins ([place]) 04/23/2018   Acquired absence of right leg below knee ([place]) 06/13/2017   Acute kidney injury superimposed on CKD ([place]) 03/15/2017   Diabetic polyneuropathy associated with type 2 diabetes mellitus ([place]) 01/23/2017   AKI (acute kidney injury) ([place])  09/28/2016   Nausea vomiting and diarrhea 09/28/2016   Onychomycosis of multiple toenails with type 2 diabetes mellitus ([place]) 08/29/2015   MRSA carrier 04/20/2014   Penile wart 02/22/2014   HIV disease ([place])    Insulin-requiring or dependent type II diabetes mellitus ([place]) 02/04/2014   Dental anomaly 11/20/2012   Arthritis of right knee 02/23/2012   Hyperlipidemia 11/10/2011   Hyponatremia 11/10/2011   Chronic pain 08/07/2011   Meralgia paraesthetica 04/23/2011   ERECTILE DYSFUNCTION 08/22/2008   Essential hypertension 05/19/2008   PCP:  [name], NP Pharmacy[facility]   [id number] [place], [id number]: [phone]: [phone] [id number] [place]: [phone]: [phone]   Social Determinants of Health (SDOH) Interventions    Readmission Risk Interventions Readmission Risk Prevention Plan 11/13/2018 11/13/2018 11/03/2018  Transportation Screening - Complete Complete  PCP or Specialist Appt within 3-5 Days - - Complete  HRI or [place] - Complete  Social Work Consult for ReHumnokelanning/Counseling - - Complete  Palliative Care Screening - - Not Applicable  Medication Review (RNBantry- Complete Complete  PCP or Specialist appointment within 3-5 days of discharge Complete - -  [facility] Consult Complete - -  SW Recovery Care/Counseling Consult Complete - -  Palliative Care Screening Not Applicable - -  Sk[place] Applicable - -  Some encounter information is confidential and restricted. Go to Review Flowsheets activity to see all data.  Some recent data might be hidden

## 2020-10-20 NOTE — Progress Notes (Addendum)
PROGRESS NOTE    Keith [name]  [id number] DOB: 12/16/1965 DOA: 10/17/2020 [name], NP   Brief Narrative:  Keith [name] is a 55 y.o. male with medical history significant for HIV, CKD 3B, type 2 diabetes, hypertension, hyperlipidemia, coronary artery disease, status post right below the knee amputation, who is sent to the ED by his orthopedic surgery provider due to concern for left heel ulcer that had failed outpatient therapy.  Associated with subjective fevers.  Patient was seen in the ED on 10/07/2020 and completed 7 days of oral doxycycline.  He followed up with orthopedic surgery and had another follow-up appointment earlier on the day of presentation where he was noted to have worsening left foot ulcer.  Patient was advised to come to the ED for IV antibiotics therapy.  Reports constant pain in his left foot.  Assessment & Plan:   Active Problems:   Cellulitis  Left foot ulcer with concern for cellulitis, POA Failed outpatient therapy with 7-day course of doxycycline, started on 10/07/2020 Continue cefepime and IV Flagyl -Zyvox added 10/20/2020 due to MRSA history Patient sent in from orthopedic office (Dr Sharol Given) - appreciate insight and recs - will continue IV abx through the weekend and follow cultlures; no need for repeat imagine in the interim per discussion with ortho today Wound care following No osteomyelitis or drainage abscess identified from MRI of the left foot done on 10/07/2020.  Large heel ulcer below the posterior calcaneus.  Dorsal subcutaneous edema in the ankle and foot tracking into the toes cellulitis is not excluded. ESR 120; CRP 11 at intake Follow cultures   Profoundly uncontrolled diabetes with hyperglycemia A1C 14 - glucose close to 500 overnight Increase sliding scale; follow hypoglycemia protocol  Diabetes polyneuropathy Resume home regimen.   HIV Resume home HAART regimen Repeat labs for HAART regimen pending per pharmacy  Chronic  anxiety/depression Resume home regimen.  S/p R BKA with ambulatory dysfunction PT OT to follow   CKD 3B At baseline  DVT prophylaxis: Subcu Lovenox daily Code Status: Full code Family Communication: None at bedside Status is: inpt  Dispo: The patient is from: Home              Anticipated d/c is to: TBD              Anticipated d/c date is: 72+h              Patient currently NOT medically stable for discharge  Consultants:  Orthopedic Surgery  Procedures:  TBD  Antimicrobials:  Cefepime/Metronidazole   Subjective: No acute issues/events overnight - pain ongoing but improving - denies headache fevers chills chest pain nausea vomiting diarrhea or constipation  Objective: Vitals:   10/19/20 1059 10/19/20 1708 10/19/20 2122 10/20/20 0442  BP: (!) 108/59 132/80 (!) 156/95 (!) 141/91  Pulse: 71 67 65 66  Resp: _0 Temp: 98.9 F (37.2 C) 98.4 F (36.9 C) (!) 97.5 F (36.4 C) 98.9 F (37.2 C)  TempSrc: Oral Oral Oral Oral  SpO2: 97% 99% 99% 100%  Weight:      Height:        Intake/Output Summary (Last 24 hours) at 10/20/2020 0824 Last data filed at 10/20/2020 0753 Gross per 24 hour  Intake 1875 ml  Output 1720 ml  Net 155 ml    Filed Weights   10/17/20 1800 10/18/20 2122  Weight: 127 kg 127 kg    Examination: General: 55 y.o. year-old male well  developed well nourished in no acute distress.  Alert and oriented x3. Cardiovascular: Regular rate and rhythm with no rubs or gallops.  No thyromegaly or JVD noted.  No lower extremity edema. 2/4 pulses in all 4 extremities. Respiratory: Clear to auscultation with no wheezes or rales. Good inspiratory effort. Abdomen: Soft nontender nondistended with normal bowel sounds x4 quadrants. Muskuloskeletal: R BKA, left lower extremity 2+ pitting edema to knee with unstageable heel ulcer(see below) Neuro: CN II-XII intact, strength, sensation, reflexes Foot as of 10/17/20 - currently in boot today    Psychiatry:  Judgement and insight appear normal. Mood is appropriate for condition and setting     Data Reviewed: I have personally reviewed following labs and imaging studies  CBC: Recent Labs  Lab 10/17/20 1807 10/18/20 0805 10/19/20 0333 10/20/20 0333  WBC 8.7 7.5 9.1 8.6  NEUTROABS 6.4  --   --   --   HGB 10.6* 10.4* 9.8* 10.4*  HCT 34.0* 32.3* 30.3* 32.2*  MCV 85.0 82.6 82.8 83.2  PLT 316 322 341 371    Basic Metabolic Panel: Recent Labs  Lab 10/17/20 1807 10/18/20 0405 10/18/20 0436 10/18/20 0805 10/19/20 0333 10/20/20 0333  NA 127*  --   --  129* 132* 130*  K 4.6  --   --  4.5 4.3 4.3  CL 95*  --   --  98 102 98  CO2 22  --   --  21* 21* 24  GLUCOSE 575*  --   --  427* 233* 304*  BUN 29*  --   --  25* 25* 24*  CREATININE 2.19* 2.19*  --  1.96* 2.12* 2.02*  CALCIUM 8.7*  --   --  8.8* 8.9 8.8*  MG  --   --  1.9  --  1.8 2.0  PHOS  --   --  3.9  --   --   --     GFR: Estimated Creatinine Clearance: 55.3 mL/min (A) (by C-G formula based on SCr of 2.02 mg/dL (H)). Liver Function Tests: Recent Labs  Lab 10/17/20 1807  AST 15  ALT 13  ALKPHOS 85  BILITOT 0.7  PROT 7.8  ALBUMIN 2.4*    No results for input(s): LIPASE, AMYLASE in the last 168 hours. No results for input(s): AMMONIA in the last 168 hours. Coagulation Profile: No results for input(s): INR, PROTIME in the last 168 hours. Cardiac Enzymes: No results for input(s): CKTOTAL, CKMB, CKMBINDEX, TROPONINI in the last 168 hours. BNP (last 3 results) No results for input(s): PROBNP in the last 8760 hours. HbA1C: No results for input(s): HGBA1C in the last 72 hours. CBG: Recent Labs  Lab 10/19/20 0645 10/19/20 1210 10/19/20 1706 10/19/20 2121 10/20/20 0629  GLUCAP 243* 207* 176* 244* 275*    Lipid Profile: No results for input(s): CHOL, HDL, LDLCALC, TRIG, CHOLHDL, LDLDIRECT in the last 72 hours. Thyroid Function Tests: No results for input(s): TSH, T4TOTAL, FREET4, T3FREE, THYROIDAB in the last  72 hours. Anemia Panel: No results for input(s): VITAMINB12, FOLATE, FERRITIN, TIBC, IRON, RETICCTPCT in the last 72 hours. Sepsis Labs: Recent Labs  Lab 10/17/20 1807 10/18/20 0418  LATICACIDVEN 2.0* 1.2     Recent Results (from the past 240 hour(s))  Blood culture (routine x 2)     Status: None (Preliminary result)   Collection Time: 10/17/20  6:30 PM   Specimen: BLOOD LEFT HAND  Result Value Ref Range Status   Specimen Description BLOOD LEFT HAND  Final  Special Requests   Final    BOTTLES DRAWN AEROBIC AND ANAEROBIC Blood Culture results may not be optimal due to an inadequate volume of blood received in culture bottles   Culture   Final    NO GROWTH 3 DAYS Performed at [facility], [place] [address] [place], [place] [address]    Report Status PENDING  Incomplete  Blood culture (routine x 2)     Status: None (Preliminary result)   Collection Time: 10/18/20  4:39 AM   Specimen: BLOOD RIGHT WRIST  Result Value Ref Range Status   Specimen Description BLOOD RIGHT WRIST  Final   Special Requests   Final    BOTTLES DRAWN AEROBIC AND ANAEROBIC Blood Culture results may not be optimal due to an inadequate volume of blood received in culture bottles   Culture   Final    NO GROWTH 2 DAYS Performed at [facility], [place] [address], [place], [place] [address]    Report Status PENDING  Incomplete  SARS CORONAVIRUS 2 (TAT 6-24 HRS) Nasopharyngeal Nasopharyngeal Swab     Status: None   Collection Time: 10/18/20  5:23 AM   Specimen: Nasopharyngeal Swab  Result Value Ref Range Status   SARS Coronavirus 2 NEGATIVE NEGATIVE Final    Comment: (NOTE) SARS-CoV-2 target nucleic acids are NOT DETECTED.  The SARS-CoV-2 RNA is generally detectable in upper and lower respiratory specimens during the acute phase of infection. Negative results do not preclude SARS-CoV-2 infection, do not rule out co-infections with other pathogens, and should not be used as the sole basis for  treatment or other patient management decisions. Negative results must be combined with clinical observations, patient history, and epidemiological information. The expected result is Negative.  Fact Sheet for Patients: SugarRoll.be  Fact Sheet for Healthcare Providers: https://www.woods-mathews.com/  This test is not yet approved or cleared by the [place] FDA and  has been authorized for detection and/or diagnosis of SARS-CoV-2 by FDA under an Emergency Use Authorization (EUA). This EUA will remain  in effect (meaning this test can be used) for the duration of the COVID-19 declaration under Se ction 564(b)(1) of the Act, 21 U.S.C. section 360bbb-3(b)(1), unless the authorization is terminated or revoked sooner.  Performed at [facility], [place] [address] [place], [place] [address]   MRSA Next Gen by PCR, Nasal     Status: None   Collection Time: 10/18/20  6:16 AM   Specimen: Nasal Mucosa; Nasal Swab  Result Value Ref Range Status   MRSA by PCR Next Gen NOT DETECTED NOT DETECTED Final    Comment: (NOTE) The GeneXpert MRSA Assay (FDA approved for NASAL specimens only), is one component of a comprehensive MRSA colonization surveillance program. It is not intended to diagnose MRSA infection nor to guide or monitor treatment for MRSA infections. Test performance is not FDA approved in patients less than 20 years old. Performed at [facility], [place] [address], [place], [place] [address]       Radiology Studies: No results found.  Scheduled Meds:  allopurinol  300 mg Oral Daily   amLODipine  10 mg Oral Daily   aspirin EC  81 mg Oral Daily   atorvastatin  80 mg Oral Daily   bictegravir-emtricitabine-tenofovir AF  1 tablet Oral Daily   carvedilol  25 mg Oral BID WC   dapsone  100 mg Oral Daily   enoxaparin (LOVENOX) injection  60 mg Subcutaneous Q24H   escitalopram  20  mg Oral Daily   gabapentin  100 mg Oral TID    icosapent Ethyl  2 g Oral BID   insulin aspart  0-15 Units Subcutaneous TID WC   insulin aspart  0-5 Units Subcutaneous QHS   insulin glargine  10 Units Subcutaneous Daily   metroNIDAZOLE  500 mg Oral Q12H   oxybutynin  2.5 mg Oral BID   ticagrelor  90 mg Oral BID   Continuous Infusions:  ceFEPime (MAXIPIME) IV 2 g (10/20/20 0402)     LOS: 2 days   Time spent: 65mn   Bladimir Auman C Detra Bores, DO [facility]  If 7PM-7AM, please contact night-coverage [email or web address]  10/20/2020, 8:24 AM

## 2020-10-20 NOTE — Progress Notes (Signed)
IP rehab admissions - I spoke with patient at the bedside.  He says that he would like SNF stay for his rehab because his elderly mom cannot assist him as needed.  I will refer back to social worker for SNF placement.  Call for questions.  [phone]

## 2020-10-20 NOTE — Progress Notes (Signed)
Physical Therapy Treatment Patient Details Name: Keith [name] MRN: [name] DOB: 03-25-1966 Today's Date: 10/20/2020    History of Present Illness Keith DOUP is a 55 y.o. male  who is sent to the ED by his orthopedic surgery provider due to concern for left heel ulcer that had failed outpatient therapy. with medical history significant for HIV, CKD 3B, type 2 diabetes, hypertension, hyperlipidemia, coronary artery disease, status post right below the knee amputation,    PT Comments    Pt presents with deficits in LE strength, endurance, power, activity tolerance, balance, and gait. Pt reportedly nauseous at start of session, but agreeable to mobility with PT. Pt requires assistance to don prosthetic as he demonstrates poor sitting balance while attempting to don independently. Pt performs transfers with seat surface elevated and cues. Pt will benefit from continued acute PT to improve strength and functional mobility. Pt was unaware that he will not receive PT services at SNF due to [facility]. SPT recommends CIR placement to maximize independence with mobility and return to prior level.   Follow Up Recommendations  CIR     Equipment Recommendations  Wheelchair (measurements PT);Wheelchair cushion (measurements PT)    Recommendations for Other Services       Precautions / Restrictions Precautions Precautions: Fall Precaution Comments: R BKA Required Braces or Orthoses: Other Brace Other Brace: LLE walking PRAFO Restrictions LLE Weight Bearing: Weight bearing as tolerated    Mobility  Bed Mobility Overal bed mobility: Modified Independent             General bed mobility comments: HOB elevated    Transfers Overall transfer level: Needs assistance Equipment used: Rolling walker (2 wheeled) Transfers: Stand Pivot Transfers   Stand pivot transfers: Min guard;From elevated surface       General transfer comment: min G for safety due to reports of  prosthetic being too loose. cues for hand placement.  Ambulation/Gait                 Stairs             Wheelchair Mobility    Modified Rankin (Stroke Patients Only)       Balance Overall balance assessment: Needs assistance Sitting-balance support: Feet supported Sitting balance-Leahy Scale: Fair     Standing balance support: During functional activity Standing balance-Leahy Scale: Poor Standing balance comment: reliant on bil UE support with standing                            Cognition Arousal/Alertness: Awake/alert Behavior During Therapy: WFL for tasks assessed/performed Overall Cognitive Status: Within Functional Limits for tasks assessed                                        Exercises      General Comments General comments (skin integrity, edema, etc.): Pt reports feeling nauseous at start of session. VSS on RA.      Pertinent Vitals/Pain Pain Assessment: Faces Faces Pain Scale: Hurts a little bit Pain Location: L LE arch Pain Descriptors / Indicators: Discomfort Pain Intervention(s): Monitored during session;Limited activity within patient's tolerance    Home Living                      Prior Function            PT Goals (  current goals can now be found in the care plan section) Acute Rehab PT Goals Patient Stated Goal: to ambulate without falling Progress towards PT goals: Progressing toward goals    Frequency    Min 3X/week      PT Plan Current plan remains appropriate    Co-evaluation              AM-PAC PT "6 Clicks" Mobility   Outcome Measure  Help needed turning from your back to your side while in a flat bed without using bedrails?: None Help needed moving from lying on your back to sitting on the side of a flat bed without using bedrails?: None Help needed moving to and from a bed to a chair (including a wheelchair)?: A Little Help needed standing up from a chair using  your arms (e.g., wheelchair or bedside chair)?: A Little Help needed to walk in hospital room?: A Lot Help needed climbing 3-5 steps with a railing? : A Lot 6 Click Score: 18    End of Session Equipment Utilized During Treatment: Gait belt Activity Tolerance: Patient tolerated treatment well Patient left: in chair;with call bell/phone within reach;with chair alarm set Nurse Communication: Mobility status PT Visit Diagnosis: Unsteadiness on feet (R26.81);Other abnormalities of gait and mobility (R26.89);Pain Pain - Right/Left: Left Pain - part of body:  (arch of L foot)     Time: [id number] PT Time Calculation (min) (ACUTE ONLY): 43 min  Charges:  $Therapeutic Activity: 8-22 mins                     Acute Rehab  Pager: [phone]    Garwin Brothers, SPT  10/20/2020, 5:24 PM

## 2020-10-20 NOTE — Progress Notes (Signed)
Inpatient Diabetes Program Recommendations  AACE/ADA: New Consensus Statement on Inpatient Glycemic Control (2015)  Target Ranges:  Prepandial:   less than 140 mg/dL      Peak postprandial:   less than 180 mg/dL (1-2 hours)      Critically ill patients:  140 - 180 mg/dL   Lab Results  Component Value Date   GLUCAP 275 (H) 10/20/2020   HGBA1C 13.9 (H) 08/29/2020    Review of Glycemic Control Results for FRISCO, STAIGER (MRN [id number]) as of 10/20/2020 10:48  Ref. Range 10/19/2020 12:10 10/19/2020 17:06 10/19/2020 21:21 10/20/2020 06:29  Glucose-Capillary Latest Ref Range: 70 - 99 mg/dL 207 (H) 176 (H) 244 (H) 275 (H)   Diabetes history: Type 2 DM Outpatient Diabetes medications: Novolog 70/30 30 units BID, Metformin 1000 mg BID Current orders for Inpatient glycemic control: Lantus 10 units QD, Novolog 0-15 units TID, Novolog 0-5 units QHS  Inpatient Diabetes Program Recommendations:    Consider further increasing Lantus to 20 units QD and adding Novolog 5 units TID (assuming patient is consuming >50% of meals).   Thanks, Bronson Curb, MSN, RNC-OB Diabetes Coordinator [phone] (8a-5p)

## 2020-10-21 DIAGNOSIS — L03116 Cellulitis of left lower limb: Secondary | ICD-10-CM | POA: Diagnosis not present

## 2020-10-21 LAB — BASIC METABOLIC PANEL
Anion gap: 9 (ref 5–15)
BUN: 24 mg/dL — ABNORMAL HIGH (ref 6–20)
CO2: 21 mmol/L — ABNORMAL LOW (ref 22–32)
Calcium: 8.6 mg/dL — ABNORMAL LOW (ref 8.9–10.3)
Chloride: 102 mmol/L (ref 98–111)
Creatinine, Ser: 2.14 mg/dL — ABNORMAL HIGH (ref 0.61–1.24)
GFR, Estimated: 36 mL/min — ABNORMAL LOW (ref >60)
Glucose, Bld: 202 mg/dL — ABNORMAL HIGH (ref 70–99)
Potassium: 4.4 mmol/L (ref 3.5–5.1)
Sodium: 132 mmol/L — ABNORMAL LOW (ref 135–145)

## 2020-10-21 LAB — GLUCOSE, CAPILLARY
Glucose-Capillary: 224 mg/dL — ABNORMAL HIGH (ref 70–99)
Glucose-Capillary: 238 mg/dL — ABNORMAL HIGH (ref 70–99)
Glucose-Capillary: 292 mg/dL — ABNORMAL HIGH (ref 70–99)
Glucose-Capillary: 269 mg/dL — ABNORMAL HIGH (ref 70–99)

## 2020-10-21 LAB — CBC
HCT: 32.9 % — ABNORMAL LOW (ref 39.0–52.0)
Hemoglobin: 10.2 g/dL — ABNORMAL LOW (ref 13.0–17.0)
MCH: 26 pg (ref 26.0–34.0)
MCHC: 31 g/dL (ref 30.0–36.0)
MCV: 83.7 fL (ref 80.0–100.0)
Platelets: 371 10*3/uL (ref 150–400)
RBC: 3.93 MIL/uL — ABNORMAL LOW (ref 4.22–5.81)
RDW: 12.8 % (ref 11.5–15.5)
WBC: 8.3 10*3/uL (ref 4.0–10.5)
nRBC: 0 % (ref 0.0–0.2)

## 2020-10-21 LAB — HIV-1 RNA QUANT-NO REFLEX-BLD
HIV 1 RNA Quant: 130 copies/mL
LOG10 HIV-1 RNA: 2.114 log10copy/mL

## 2020-10-21 LAB — MAGNESIUM: Magnesium: 1.8 mg/dL (ref 1.7–2.4)

## 2020-10-21 NOTE — Plan of Care (Signed)
  Problem: Clinical Measurements: Goal: Ability to avoid or minimize complications of infection will improve Outcome: Progressing   Problem: Activity: Goal: Risk for activity intolerance will decrease Outcome: Progressing   Problem: Coping: Goal: Level of anxiety will decrease Outcome: Progressing   Problem: Pain Managment: Goal: General experience of comfort will improve Outcome: Progressing

## 2020-10-21 NOTE — Progress Notes (Signed)
PROGRESS NOTE    Keith [name]  UQJ:[id number] DOB: [birth year] DOA: 10/17/2020 [name], NP   Brief Narrative:  Keith [name] is a 55 y.o. male with medical history significant for HIV, CKD 3B, type 2 diabetes, hypertension, hyperlipidemia, coronary artery disease, status post right below the knee amputation, who is sent to the ED by his orthopedic surgery provider due to concern for left heel ulcer that had failed outpatient therapy.  Associated with subjective fevers.  Patient was seen in the ED on 10/07/2020 and completed 7 days of oral doxycycline.  He followed up with orthopedic surgery and had another follow-up appointment earlier on the day of presentation where he was noted to have worsening left foot ulcer.  Patient was advised to come to the ED for IV antibiotics therapy.  Reports constant pain in his left foot.  Assessment & Plan:   Active Problems:   Cellulitis  Left foot ulcer with concern for cellulitis, POA Failed outpatient therapy with 7-day course of doxycycline, started on 10/07/2020 with worsening symptoms Continue cefepime and IV Flagyl -Zyvox added 10/20/2020 due to MRSA history Patient sent in from orthopedic office (Dr Sharol Given) - appreciate insight and recs - will continue IV abx through the weekend and follow cultlures; no need for repeat imagine in the interim per discussion with ortho today Wound care following No osteomyelitis or drainage abscess identified from MRI of the left foot done on 10/07/2020.  Large heel ulcer below the posterior calcaneus.  Dorsal subcutaneous edema in the ankle and foot tracking into the toes cellulitis is not excluded. ESR 120; CRP 11 at intake Follow cultures - preliminary negative   Profoundly uncontrolled diabetes with hyperglycemia, improving A1C 14 - glucose more well controlled over the past 24h - continue to adjust to goal of 100-150 Lantus increased to 15u today Increase sliding scale; follow hypoglycemia  protocol  Diabetes polyneuropathy Resume home regimen.   HIV Resume home HAART regimen Repeat labs for HAART regimen pending per pharmacy: HIV RNA Quant 130; Log10 HIV RNA 2.114  Chronic anxiety/depression Resume home regimen.  S/p R BKA with ambulatory dysfunction PT OT to follow   CKD 3B At baseline  DVT prophylaxis: Subcu Lovenox daily Code Status: Full code Family Communication: None at bedside Status is: inpt  Dispo: The patient is from: Home              Anticipated d/c is to: TBD              Anticipated d/c date is: 72+h              Patient currently NOT medically stable for discharge  Consultants:  Orthopedic Surgery  Procedures:  TBD  Antimicrobials:  Cefepime/Metronidazole   Subjective: No acute issues/events overnight - patient continues to be noncompliant with foot elevation - re-educated this am. Encouraged to ambulate - pain currently well controlled.  Objective: Vitals:   10/20/20 0959 10/20/20 1906 10/20/20 2107 10/21/20 0538  BP: (!) 148/96 135/84 126/81 (!) 145/97  Pulse: 68 67 66 66  Resp: $Remo'18 18 18 18  'qcUda$ Temp: 98.4 F (36.9 C) 98.3 F (36.8 C) 98.4 F (36.9 C) 98.5 F (36.9 C)  TempSrc: Oral Oral Oral   SpO2: 99% 98% 98% 100%  Weight:   128 kg   Height:        Intake/Output Summary (Last 24 hours) at 10/21/2020 0800 Last data filed at 10/21/2020 0631 Gross per 24 hour  Intake 820 ml  Output 1100 ml  Net -280 ml    Filed Weights   10/17/20 1800 10/18/20 2122 10/20/20 2107  Weight: 127 kg 127 kg 128 kg    Examination: General: 55 y.o. year-old male well developed well nourished in no acute distress.  Alert and oriented x3. Cardiovascular: Regular rate and rhythm with no rubs or gallops.  No thyromegaly or JVD noted.  No lower extremity edema. 2/4 pulses in all 4 extremities. Respiratory: Clear to auscultation with no wheezes or rales. Good inspiratory effort. Abdomen: Soft nontender nondistended with normal bowel sounds x4  quadrants. Muskuloskeletal: R BKA, left lower extremity 2+ pitting edema to knee with unstageable heel ulcer(see below) Neuro: CN II-XII intact, strength, sensation, reflexes Foot as of 10/17/20 - currently in boot today    Psychiatry: Judgement and insight appear normal. Mood is appropriate for condition and setting     Data Reviewed: I have personally reviewed following labs and imaging studies  CBC: Recent Labs  Lab 10/17/20 1807 10/18/20 0805 10/19/20 0333 10/20/20 0333 10/21/20 0252  WBC 8.7 7.5 9.1 8.6 8.3  NEUTROABS 6.4  --   --   --   --   HGB 10.6* 10.4* 9.8* 10.4* 10.2*  HCT 34.0* 32.3* 30.3* 32.2* 32.9*  MCV 85.0 82.6 82.8 83.2 83.7  PLT 316 322 341 364 127    Basic Metabolic Panel: Recent Labs  Lab 10/17/20 1807 10/18/20 0405 10/18/20 0436 10/18/20 0805 10/19/20 0333 10/20/20 0333 10/21/20 0252  NA 127*  --   --  129* 132* 130* 132*  K 4.6  --   --  4.5 4.3 4.3 4.4  CL 95*  --   --  98 102 98 102  CO2 22  --   --  21* 21* 24 21*  GLUCOSE 575*  --   --  427* 233* 304* 202*  BUN 29*  --   --  25* 25* 24* 24*  CREATININE 2.19* 2.19*  --  1.96* 2.12* 2.02* 2.14*  CALCIUM 8.7*  --   --  8.8* 8.9 8.8* 8.6*  MG  --   --  1.9  --  1.8 2.0 1.8  PHOS  --   --  3.9  --   --   --   --     GFR: Estimated Creatinine Clearance: 52.4 mL/min (A) (by C-G formula based on SCr of 2.14 mg/dL (H)). Liver Function Tests: Recent Labs  Lab 10/17/20 1807  AST 15  ALT 13  ALKPHOS 85  BILITOT 0.7  PROT 7.8  ALBUMIN 2.4*    No results for input(s): LIPASE, AMYLASE in the last 168 hours. No results for input(s): AMMONIA in the last 168 hours. Coagulation Profile: No results for input(s): INR, PROTIME in the last 168 hours. Cardiac Enzymes: No results for input(s): CKTOTAL, CKMB, CKMBINDEX, TROPONINI in the last 168 hours. BNP (last 3 results) No results for input(s): PROBNP in the last 8760 hours. HbA1C: No results for input(s): HGBA1C in the last 72  hours. CBG: Recent Labs  Lab 10/20/20 0629 10/20/20 1115 10/20/20 1747 10/20/20 2104 10/21/20 0637  GLUCAP 275* 323* 186* 201* 224*    Lipid Profile: No results for input(s): CHOL, HDL, LDLCALC, TRIG, CHOLHDL, LDLDIRECT in the last 72 hours. Thyroid Function Tests: No results for input(s): TSH, T4TOTAL, FREET4, T3FREE, THYROIDAB in the last 72 hours. Anemia Panel: No results for input(s): VITAMINB12, FOLATE, FERRITIN, TIBC, IRON, RETICCTPCT in the last 72 hours. Sepsis Labs: Recent Labs  Lab 10/17/20 1807  10/18/20 0418  LATICACIDVEN 2.0* 1.2     Recent Results (from the past 240 hour(s))  Blood culture (routine x 2)     Status: None (Preliminary result)   Collection Time: 10/17/20  6:30 PM   Specimen: BLOOD LEFT HAND  Result Value Ref Range Status   Specimen Description BLOOD LEFT HAND  Final   Special Requests   Final    BOTTLES DRAWN AEROBIC AND ANAEROBIC Blood Culture results may not be optimal due to an inadequate volume of blood received in culture bottles   Culture   Final    NO GROWTH 3 DAYS Performed at [facility], [place] [address], [place], [place] [address]    Report Status PENDING  Incomplete  Blood culture (routine x 2)     Status: None (Preliminary result)   Collection Time: 10/18/20  4:39 AM   Specimen: BLOOD RIGHT WRIST  Result Value Ref Range Status   Specimen Description BLOOD RIGHT WRIST  Final   Special Requests   Final    BOTTLES DRAWN AEROBIC AND ANAEROBIC Blood Culture results may not be optimal due to an inadequate volume of blood received in culture bottles   Culture   Final    NO GROWTH 2 DAYS Performed at [facility], [place] [address], [place], [place] [address]    Report Status PENDING  Incomplete  SARS CORONAVIRUS 2 (TAT 6-24 HRS) Nasopharyngeal Nasopharyngeal Swab     Status: None   Collection Time: 10/18/20  5:23 AM   Specimen: Nasopharyngeal Swab  Result Value Ref Range Status   SARS Coronavirus 2 NEGATIVE NEGATIVE  Final    Comment: (NOTE) SARS-CoV-2 target nucleic acids are NOT DETECTED.  The SARS-CoV-2 RNA is generally detectable in upper and lower respiratory specimens during the acute phase of infection. Negative results do not preclude SARS-CoV-2 infection, do not rule out co-infections with other pathogens, and should not be used as the sole basis for treatment or other patient management decisions. Negative results must be combined with clinical observations, patient history, and epidemiological information. The expected result is Negative.  Fact Sheet for Patients: SugarRoll.be  Fact Sheet for Healthcare Providers: https://www.woods-mathews.com/  This test is not yet approved or cleared by the [place] FDA and  has been authorized for detection and/or diagnosis of SARS-CoV-2 by FDA under an Emergency Use Authorization (EUA). This EUA will remain  in effect (meaning this test can be used) for the duration of the COVID-19 declaration under Se ction 564(b)(1) of the Act, 21 U.S.C. section 360bbb-3(b)(1), unless the authorization is terminated or revoked sooner.  Performed at [facility], [place] [address], [place], [place] [address]   MRSA Next Gen by PCR, Nasal     Status: None   Collection Time: 10/18/20  6:16 AM   Specimen: Nasal Mucosa; Nasal Swab  Result Value Ref Range Status   MRSA by PCR Next Gen NOT DETECTED NOT DETECTED Final    Comment: (NOTE) The GeneXpert MRSA Assay (FDA approved for NASAL specimens only), is one component of a comprehensive MRSA colonization surveillance program. It is not intended to diagnose MRSA infection nor to guide or monitor treatment for MRSA infections. Test performance is not FDA approved in patients less than 79 years old. Performed at [facility], [place] [address] [place], [place] [address]       Radiology Studies: No results found.  Scheduled Meds:  allopurinol  300 mg Oral  Daily   amLODipine  10 mg Oral Daily   aspirin EC  81 mg Oral Daily   atorvastatin  80 mg Oral Daily   bictegravir-emtricitabine-tenofovir AF  1 tablet Oral Daily   carvedilol  25 mg Oral BID WC   dapsone  100 mg Oral Daily   enoxaparin (LOVENOX) injection  60 mg Subcutaneous Q24H   escitalopram  20 mg Oral Daily   gabapentin  100 mg Oral TID   icosapent Ethyl  2 g Oral BID   insulin aspart  0-15 Units Subcutaneous TID WC   insulin aspart  0-5 Units Subcutaneous QHS   insulin glargine  15 Units Subcutaneous Daily   linezolid  600 mg Oral Q12H   metroNIDAZOLE  500 mg Oral Q12H   oxybutynin  2.5 mg Oral BID   ticagrelor  90 mg Oral BID   Continuous Infusions:  ceFEPime (MAXIPIME) IV 2 g (10/21/20 0540)     LOS: 3 days   Time spent: 34min   Levone Otten C Aarilyn Dye, DO [facility]  If 7PM-7AM, please contact night-coverage [email or web address]  10/21/2020, 8:00 AM

## 2020-10-21 NOTE — NC FL2 (Signed)
[place] LEVEL OF CARE SCREENING TOOL     IDENTIFICATION  Patient Name: Keith [name] Birthdate: 04-30-1965 Sex: male Admission Date (Current Location): 10/17/2020  [facility] [place] Number:      Facility and Address:  [facility], [place] [address] [place],  [address]      Provider Number: [id number]  Attending Physician Name and Address:  Little Ishikawa, MD  Relative Name and Phone Number:  [name], mother,    [phone]    Current Level of Care: Hospital Recommended Level of Care: [place] Prior Approval Number:    Date Approved/Denied:   PASRR Number: [id number]  Discharge Plan: SNF    Current Diagnoses: Patient Active Problem List   Diagnosis Date Noted   Cellulitis 10/18/2020   Atypical chest pain [id number]   Acute metabolic encephalopathy [id number]   Hyperglycemia due to diabetes mellitus ([place]) 08/29/2020   Hyperglycemia 08/29/2020   Acute cerebrovascular accident (CVA) due to ischemia ([place]) 04/28/2020   Type 2 diabetes mellitus with hyperlipidemia (HCC)    PAD (peripheral artery disease) ([place])    Hypoglycemia 04/25/2020   Acute ST elevation myocardial infarction (STEMI) due to occlusion of circumflex coronary artery ([place]) 03/22/2020   Uncontrolled type 2 diabetes mellitus with hyperglycemia ([place]) 01/14/2019   Elevated glucose 01/14/2019   Hx of BKA, right ([place]) 01/14/2019   Pressure injury of skin 11/11/2018   STEMI (ST elevation myocardial infarction) ([place]) 11/10/2018   CKD (chronic kidney disease) stage 4, GFR 15-29 ml/min (HCC) 11/10/2018   Amputation stump infection ([place]) 10/28/2018   Type II diabetes mellitus with renal manifestations ([place]) 08/07/2018   Uncontrolled type 2 diabetes mellitus with hyperosmolar nonketotic hyperglycemia ([place]) 08/07/2018   Non-pressure chronic ulcer of right calf limited to breakdown of skin ([place]) 07/06/2018   Type 2 diabetes mellitus without  complication, without long-term current use of insulin ([place]) 06/15/2018   Chronic low back pain without sciatica 06/15/2018   Idiopathic chronic venous hypertension of left lower extremity with ulcer and inflammation (HCC)    Venous stasis ulcer of left calf limited to breakdown of skin without varicose veins (HCC) 04/23/2018   Acquired absence of right leg below knee ([place]) 06/13/2017   Acute kidney injury superimposed on CKD ([place]) 03/15/2017   Diabetic polyneuropathy associated with type 2 diabetes mellitus ([place]) 01/23/2017   AKI (acute kidney injury) ([place]) 09/28/2016   Nausea vomiting and diarrhea 09/28/2016   Onychomycosis of multiple toenails with type 2 diabetes mellitus ([place]) 08/29/2015   MRSA carrier 04/20/2014   Penile wart 02/22/2014   HIV disease ([place])    Insulin-requiring or dependent type II diabetes mellitus ([place]) 02/04/2014   Dental anomaly 11/20/2012   Arthritis of right knee 02/23/2012   Hyperlipidemia 11/10/2011   Hyponatremia 11/10/2011   Chronic pain 08/07/2011   Meralgia paraesthetica 04/23/2011   ERECTILE DYSFUNCTION 08/22/2008   Essential hypertension 05/19/2008    Orientation RESPIRATION BLADDER Height & Weight     Self, Time, Situation, Place  Normal Continent Weight: 282 lb 3 oz (128 kg) Height:  '5\' 10"'$  (177.8 cm)  BEHAVIORAL SYMPTOMS/MOOD NEUROLOGICAL BOWEL NUTRITION STATUS      Continent Diet (Please see DC Summary)  AMBULATORY STATUS COMMUNICATION OF NEEDS Skin   Limited Assist Verbally Other (Comment) (non pressure wound on heel; gauze dressing)                       Personal Care Assistance Level of Assistance  Bathing, Feeding, Dressing Bathing Assistance: Limited assistance Feeding assistance: Independent Dressing Assistance: Limited assistance     Functional Limitations Info             SPECIAL CARE FACTORS FREQUENCY  PT (By licensed PT), OT (By licensed OT)     PT Frequency: 2x/week OT Frequency: 2x/week             Contractures Contractures Info: Not present    Additional Factors Info  Code Status, Allergies, Insulin Sliding Scale Code Status Info: Full Allergies Info: Elemental Sulfur, Sulfa Antibiotics   Insulin Sliding Scale Info: See DC Summary       Current Medications (10/21/2020):  This is the current hospital active medication list Current Facility-Administered Medications  Medication Dose Route Frequency Provider Last Rate Last Admin   allopurinol (ZYLOPRIM) tablet 300 mg  300 mg Oral Daily Irene Pap N, DO   300 mg at 10/21/20 0854   amLODipine (NORVASC) tablet 10 mg  10 mg Oral Daily Irene Pap N, DO   10 mg at 10/21/20 [id number]   aspirin EC tablet 81 mg  81 mg Oral Daily Kayleen Memos, DO   81 mg at 10/21/20 0854   atorvastatin (LIPITOR) tablet 80 mg  80 mg Oral Daily Kayleen Memos, DO   80 mg at 10/21/20 0856   bictegravir-emtricitabine-tenofovir AF (BIKTARVY) 50-200-25 MG per tablet 1 tablet  1 tablet Oral Daily Irene Pap N, DO   1 tablet at 10/21/20 0856   carvedilol (COREG) tablet 25 mg  25 mg Oral BID WC Irene Pap N, DO   25 mg at 10/21/20 0848   ceFEPIme (MAXIPIME) 2 g in sodium chloride 0.9 % 100 mL IVPB  2 g Intravenous Q12H Laren Everts, RPH   Stopped at 10/21/20 [id number]   dapsone tablet 100 mg  100 mg Oral Daily Irene Pap N, DO   100 mg at 10/21/20 0856   enoxaparin (LOVENOX) injection 60 mg  60 mg Subcutaneous Q24H Little Ishikawa, MD   60 mg at 10/21/20 0538   escitalopram (LEXAPRO) tablet 20 mg  20 mg Oral Daily Irene Pap N, DO   20 mg at 10/21/20 [id number]   gabapentin (NEURONTIN) capsule 100 mg  100 mg Oral TID Irene Pap N, DO   100 mg at 10/21/20 [id number]   HYDROmorphone (DILAUDID) injection 1 mg  1 mg Intravenous Q4H PRN Irene Pap N, DO   1 mg at 10/18/20 [id number]   icosapent Ethyl (VASCEPA) 1 g capsule 2 g  2 g Oral BID Irene Pap N, DO   2 g at 10/21/20 0855   insulin aspart (novoLOG) injection 0-15 Units  0-15 Units Subcutaneous TID WC Little Ishikawa, MD   5 Units at 10/21/20 0849   insulin aspart (novoLOG) injection 0-5 Units  0-5 Units Subcutaneous QHS Irene Pap N, DO   2 Units at 10/20/20 2203   insulin glargine (LANTUS) injection 15 Units  15 Units Subcutaneous Daily Onnie Boer Q, RPH-CPP   15 Units at 10/21/20 [id number]   linezolid (ZYVOX) tablet 600 mg  600 mg Oral Q12H Pham, Minh Q, RPH-CPP   600 mg at 10/21/20 0856   metroNIDAZOLE (FLAGYL) tablet 500 mg  500 mg Oral Q12H Pham, Minh Q, RPH-CPP   500 mg at 10/21/20 0853   oxybutynin (DITROPAN) tablet 2.5 mg  2.5 mg Oral BID Irene Pap N, DO   2.5 mg at 10/21/20 [id number]   oxyCODONE (Oxy IR/ROXICODONE) immediate release tablet  5 mg  5 mg Oral Q6H PRN Irene Pap N, DO   5 mg at 10/19/20 0426   polyethylene glycol (MIRALAX / GLYCOLAX) packet 17 g  17 g Oral Daily PRN Irene Pap N, DO       ticagrelor (BRILINTA) tablet 90 mg  90 mg Oral BID Irene Pap N, DO   90 mg at 10/21/20 [id number]     Discharge Medications: Please see discharge summary for a list of discharge medications.  Relevant Imaging Results:  Relevant Lab Results:   Additional Information SS#: [id number]. JANSSEN COVID-19 VACCINE 11/12/2019 only  Benard Halsted, LCSW

## 2020-10-21 NOTE — TOC Progression Note (Addendum)
Transition of Care [facility]) - Progression Note    Patient Details  Name: Keith [name] MRN: [id number] Date of Birth: 1965/12/22  Transition of Care [facility]) CM/SW [place], LCSW Phone Number: 10/21/2020, 11:54 AM  Clinical Narrative:    CSW received consult for possible SNF placement at time of discharge. CSW spoke with patient. Patient reported that patient's mother is currently unable to care for patient at their home given patient's current physical needs and fall risk. Patient expressed understanding of PT recommendation and is agreeable to SNF placement at time of discharge. Patient reports he is aware of signing his check over for a month to the SNF and has been to [place] before so understands that Medicaid does not provide therapy unless the facility provides complementary therapy services. Patient has only received one J&J COVID vaccine. He requested [facility], however they only accept vaccinated and boosted patients and do not have Medicaid beds available. Will provide bed offers as available.   Skilled Nursing Rehab Facilities-   [email or web address] *Ratings updated quarterly   Ratings out of 5 possible   Name Address  Phone # [place] [facility] Overall  [facility] [address], [place] '5  4 4  '$ [facility]  [address] [place], [place] [phone] '4 2 4 4  '$ [facility] [place], [place] '2 3 1 1  '$ [facility] [place], [place] '3 3 4 4  '$ [facility] [address] [place] '3  2 2  '$ [facility] '2 2 4 4  '$ [facility] [address], [place] '5 2 2 3  '$ [facility] [address], [place] '4 2 2 2  '$ [place] [facility] [place] '5 2 2 3  '$ [facility]  Nursing [phone], [place] [phone] '5 1 2 2  '$ [facility] [address], [facility] [phone] '5 2 2 3  '$ [facility] [id number] '3 1 1 1      '$ Expected Discharge Plan: Skilled Nursing Facility Barriers to Discharge: SNF Pending bed offer  Expected Discharge Plan and Services Expected Discharge Plan: [place] In-house Referral: Clinical Social Work Discharge Planning Services: CM Consult Post Acute Care Choice: [place] arrangements for the past 2 months: Single Family Home                                       Social Determinants of Health (SDOH) Interventions    Readmission Risk Interventions Readmission Risk Prevention Plan 10/21/2020 11/13/2018 11/13/2018  Transportation Screening Complete - Complete  PCP or Specialist Appt within 3-5 Days - - -  HRI or [place] Work Consult for [place] - - -  Medication Review Press photographer) Complete - Complete  PCP or Specialist appointment within 3-5 days of discharge Complete Complete -  [place] or Home Care Consult Complete Complete -  SW Recovery Care/Counseling Consult Complete Complete -  Palliative Care Screening Not Applicable Not Applicable -  [place] Complete Not Applicable -  Some encounter information is confidential and restricted. Go to Review Flowsheets activity to see all data.  Some recent data might  be hidden

## 2020-10-21 NOTE — Plan of Care (Signed)
?  Problem: Clinical Measurements: ?Goal: Ability to avoid or minimize complications of infection will improve ?Outcome: Progressing ?  ?Problem: Skin Integrity: ?Goal: Skin integrity will improve ?Outcome: Progressing ?  ?

## 2020-10-22 DIAGNOSIS — N289 Disorder of kidney and ureter, unspecified: Secondary | ICD-10-CM

## 2020-10-22 DIAGNOSIS — D649 Anemia, unspecified: Secondary | ICD-10-CM | POA: Diagnosis not present

## 2020-10-22 DIAGNOSIS — L97409 Non-pressure chronic ulcer of unspecified heel and midfoot with unspecified severity: Secondary | ICD-10-CM

## 2020-10-22 DIAGNOSIS — E11621 Type 2 diabetes mellitus with foot ulcer: Secondary | ICD-10-CM

## 2020-10-22 DIAGNOSIS — E13621 Other specified diabetes mellitus with foot ulcer: Secondary | ICD-10-CM | POA: Diagnosis not present

## 2020-10-22 DIAGNOSIS — R739 Hyperglycemia, unspecified: Secondary | ICD-10-CM | POA: Diagnosis not present

## 2020-10-22 DIAGNOSIS — I10 Essential (primary) hypertension: Secondary | ICD-10-CM

## 2020-10-22 LAB — CBC
HCT: 32.3 % — ABNORMAL LOW (ref 39.0–52.0)
Hemoglobin: 10.3 g/dL — ABNORMAL LOW (ref 13.0–17.0)
MCH: 26.4 pg (ref 26.0–34.0)
MCHC: 31.9 g/dL (ref 30.0–36.0)
MCV: 82.8 fL (ref 80.0–100.0)
Platelets: 376 10*3/uL (ref 150–400)
RBC: 3.9 MIL/uL — ABNORMAL LOW (ref 4.22–5.81)
RDW: 12.9 % (ref 11.5–15.5)
WBC: 8.5 10*3/uL (ref 4.0–10.5)
nRBC: 0 % (ref 0.0–0.2)

## 2020-10-22 LAB — BASIC METABOLIC PANEL
Anion gap: 6 (ref 5–15)
BUN: 19 mg/dL (ref 6–20)
CO2: 25 mmol/L (ref 22–32)
Calcium: 8.8 mg/dL — ABNORMAL LOW (ref 8.9–10.3)
Chloride: 101 mmol/L (ref 98–111)
Creatinine, Ser: 1.97 mg/dL — ABNORMAL HIGH (ref 0.61–1.24)
GFR, Estimated: 39 mL/min — ABNORMAL LOW (ref >60)
Glucose, Bld: 249 mg/dL — ABNORMAL HIGH (ref 70–99)
Potassium: 4.7 mmol/L (ref 3.5–5.1)
Sodium: 132 mmol/L — ABNORMAL LOW (ref 135–145)

## 2020-10-22 LAB — CULTURE, BLOOD (ROUTINE X 2): Culture: NO GROWTH

## 2020-10-22 LAB — GLUCOSE, CAPILLARY
Glucose-Capillary: 226 mg/dL — ABNORMAL HIGH (ref 70–99)
Glucose-Capillary: 285 mg/dL — ABNORMAL HIGH (ref 70–99)
Glucose-Capillary: 231 mg/dL — ABNORMAL HIGH (ref 70–99)
Glucose-Capillary: 220 mg/dL — ABNORMAL HIGH (ref 70–99)

## 2020-10-22 LAB — MAGNESIUM: Magnesium: 1.8 mg/dL (ref 1.7–2.4)

## 2020-10-22 MED ORDER — INSULIN GLARGINE 100 UNIT/ML ~~LOC~~ SOLN
25.0000 [IU] | Freq: Every day | SUBCUTANEOUS | Status: DC
  Administered 2020-10-22 – 2020-10-24 (×3): 25 [IU] via SUBCUTANEOUS
  Filled 2020-10-22 (×4): qty 0.25

## 2020-10-22 NOTE — Progress Notes (Signed)
PROGRESS NOTE    Keith [name]  PNT:[id number] DOB: [birth year] DOA: 10/17/2020 [name], NP   Brief Narrative:  Keith [name] is a 55 y.o. male with medical history significant for HIV, CKD 3B, type 2 diabetes, hypertension, hyperlipidemia, coronary artery disease, status post right below the knee amputation, who is sent to the ED by his orthopedic surgery provider due to concern for left heel ulcer that had failed outpatient therapy.  Associated with subjective fevers.  Patient was seen in the ED on 10/07/2020 and completed 7 days of oral doxycycline.  He followed up with orthopedic surgery and had another follow-up appointment earlier on the day of presentation where he was noted to have worsening left foot ulcer.  Patient was advised to come to the ED for IV antibiotics therapy.  Reports constant pain in his left foot.  Assessment & Plan:   Active Problems:   Cellulitis  Left foot ulcer with concern for cellulitis, POA Failed outpatient therapy with 7-day course of doxycycline, started on 10/07/2020 with worsening symptoms Continue cefepime and IV Flagyl -Zyvox added 10/20/2020 due to MRSA history Patient sent in from orthopedic office (Dr Sharol Given) - appreciate insight and recs - will continue IV abx through the weekend and follow cultlures; no need for repeat imagine in the interim per discussion with ortho today Wound care following No osteomyelitis or drainage abscess identified from MRI of the left foot done on 10/07/2020.  Large heel ulcer below the posterior calcaneus.  Dorsal subcutaneous edema in the ankle and foot tracking into the toes cellulitis is not excluded. ESR 120; CRP 11 at intake Follow cultures - negative thus  far   Profoundly uncontrolled diabetes with hyperglycemia, improving A1C 14 - glucose more well controlled over the past 24h - continue to adjust to goal of 100-150 Lantus increased to 25u today Increase sliding scale; follow hypoglycemia  protocol  Diabetes polyneuropathy Resume home regimen. Increase insulin - likely poor dietary compliance at home given profoundly elevated A1C   HIV Resume home HAART regimen Repeat labs for HAART regimen pending per pharmacy: HIV RNA Quant 130; Log10 HIV RNA 2.114  Chronic anxiety/depression Resume home regimen.  S/p R BKA with ambulatory dysfunction PT OT to follow   CKD 3B At baseline  DVT prophylaxis: Subcu Lovenox daily Code Status: Full code Family Communication: None at bedside Status is: inpt  Dispo: The patient is from: Home              Anticipated d/c is to: TBD              Anticipated d/c date is: >72h              Patient currently NOT medically stable for discharge  Consultants:  Orthopedic Surgery  Procedures:  TBD  Antimicrobials:  Cefepime/Metronidazole   Subjective: No acute issues/events overnight. Encouraged to ambulate - pain currently well controlled.  Objective: Vitals:   10/21/20 0938 10/21/20 1619 10/21/20 2104 10/22/20 0525  BP: 123/87 (!) 158/94 (!) 161/98 (!) 147/97  Pulse: 71 68 66 70  Resp: _0 Temp: 98.1 F (36.7 C) 98.4 F (36.9 C) 98.1 F (36.7 C) 98.1 F (36.7 C)  TempSrc: Oral Oral Oral Oral  SpO2: 99% 99% 99% 98%  Weight:    124.8 kg  Height:        Intake/Output Summary (Last 24 hours) at 10/22/2020 0757 Last data filed at 10/22/2020 0756 Gross per 24 hour  Intake  1580 ml  Output 1200 ml  Net 380 ml    Filed Weights   10/18/20 2122 10/20/20 2107 10/22/20 0525  Weight: 127 kg 128 kg 124.8 kg    Examination: General: 55 y.o. year-old male well developed well nourished in no acute distress.  Alert and oriented x3. Cardiovascular: Regular rate and rhythm with no rubs or gallops.  No thyromegaly or JVD noted.  No lower extremity edema. 2/4 pulses in all 4 extremities. Respiratory: Clear to auscultation with no wheezes or rales. Good inspiratory effort. Abdomen: Soft nontender nondistended with normal  bowel sounds x4 quadrants. Muskuloskeletal: R BKA, left lower extremity 2+ pitting edema to knee with unstageable heel ulcer(see below) Neuro: CN II-XII intact, strength, sensation, reflexes Skin: LLE foot bandage clean/dry/intact  Psychiatry: Judgement and insight appear normal. Mood is appropriate for condition and setting  Data Reviewed: I have personally reviewed following labs and imaging studies  CBC: Recent Labs  Lab 10/17/20 1807 10/18/20 0805 10/19/20 0333 10/20/20 0333 10/21/20 0252 10/22/20 0320  WBC 8.7 7.5 9.1 8.6 8.3 8.5  NEUTROABS 6.4  --   --   --   --   --   HGB 10.6* 10.4* 9.8* 10.4* 10.2* 10.3*  HCT 34.0* 32.3* 30.3* 32.2* 32.9* 32.3*  MCV 85.0 82.6 82.8 83.2 83.7 82.8  PLT 316 322 341 364 371 109    Basic Metabolic Panel: Recent Labs  Lab 10/18/20 0436 10/18/20 0805 10/19/20 0333 10/20/20 0333 10/21/20 0252 10/22/20 0320  NA  --  129* 132* 130* 132* 132*  K  --  4.5 4.3 4.3 4.4 4.7  CL  --  98 102 98 102 101  CO2  --  21* 21* 24 21* 25  GLUCOSE  --  427* 233* 304* 202* 249*  BUN  --  25* 25* 24* 24* 19  CREATININE  --  1.96* 2.12* 2.02* 2.14* 1.97*  CALCIUM  --  8.8* 8.9 8.8* 8.6* 8.8*  MG 1.9  --  1.8 2.0 1.8 1.8  PHOS 3.9  --   --   --   --   --     GFR: Estimated Creatinine Clearance: 56.2 mL/min (A) (by C-G formula based on SCr of 1.97 mg/dL (H)). Liver Function Tests: Recent Labs  Lab 10/17/20 1807  AST 15  ALT 13  ALKPHOS 85  BILITOT 0.7  PROT 7.8  ALBUMIN 2.4*    No results for input(s): LIPASE, AMYLASE in the last 168 hours. No results for input(s): AMMONIA in the last 168 hours. Coagulation Profile: No results for input(s): INR, PROTIME in the last 168 hours. Cardiac Enzymes: No results for input(s): CKTOTAL, CKMB, CKMBINDEX, TROPONINI in the last 168 hours. BNP (last 3 results) No results for input(s): PROBNP in the last 8760 hours. HbA1C: No results for input(s): HGBA1C in the last 72 hours. CBG: Recent Labs  Lab  10/21/20 0637 10/21/20 1127 10/21/20 1618 10/21/20 2106 10/22/20 0634  GLUCAP 224* 238* 292* 269* 226*    Lipid Profile: No results for input(s): CHOL, HDL, LDLCALC, TRIG, CHOLHDL, LDLDIRECT in the last 72 hours. Thyroid Function Tests: No results for input(s): TSH, T4TOTAL, FREET4, T3FREE, THYROIDAB in the last 72 hours. Anemia Panel: No results for input(s): VITAMINB12, FOLATE, FERRITIN, TIBC, IRON, RETICCTPCT in the last 72 hours. Sepsis Labs: Recent Labs  Lab 10/17/20 1807 10/18/20 0418  LATICACIDVEN 2.0* 1.2     Recent Results (from the past 240 hour(s))  Blood culture (routine x 2)     Status:  None (Preliminary result)   Collection Time: 10/17/20  6:30 PM   Specimen: BLOOD LEFT HAND  Result Value Ref Range Status   Specimen Description BLOOD LEFT HAND  Final   Special Requests   Final    BOTTLES DRAWN AEROBIC AND ANAEROBIC Blood Culture results may not be optimal due to an inadequate volume of blood received in culture bottles   Culture   Final    NO GROWTH 4 DAYS Performed at [facility], [place] [address] [place], [place] [address]    Report Status PENDING  Incomplete  Blood culture (routine x 2)     Status: None (Preliminary result)   Collection Time: 10/18/20  4:39 AM   Specimen: BLOOD RIGHT WRIST  Result Value Ref Range Status   Specimen Description BLOOD RIGHT WRIST  Final   Special Requests   Final    BOTTLES DRAWN AEROBIC AND ANAEROBIC Blood Culture results may not be optimal due to an inadequate volume of blood received in culture bottles   Culture   Final    NO GROWTH 3 DAYS Performed at [facility], [place] [address] [place], [place] [address]    Report Status PENDING  Incomplete  SARS CORONAVIRUS 2 (TAT 6-24 HRS) Nasopharyngeal Nasopharyngeal Swab     Status: None   Collection Time: 10/18/20  5:23 AM   Specimen: Nasopharyngeal Swab  Result Value Ref Range Status   SARS Coronavirus 2 NEGATIVE NEGATIVE Final    Comment:  (NOTE) SARS-CoV-2 target nucleic acids are NOT DETECTED.  The SARS-CoV-2 RNA is generally detectable in upper and lower respiratory specimens during the acute phase of infection. Negative results do not preclude SARS-CoV-2 infection, do not rule out co-infections with other pathogens, and should not be used as the sole basis for treatment or other patient management decisions. Negative results must be combined with clinical observations, patient history, and epidemiological information. The expected result is Negative.  Fact Sheet for Patients: SugarRoll.be  Fact Sheet for Healthcare Providers: https://www.woods-mathews.com/  This test is not yet approved or cleared by the [place] FDA and  has been authorized for detection and/or diagnosis of SARS-CoV-2 by FDA under an Emergency Use Authorization (EUA). This EUA will remain  in effect (meaning this test can be used) for the duration of the COVID-19 declaration under Se ction 564(b)(1) of the Act, 21 U.S.C. section 360bbb-3(b)(1), unless the authorization is terminated or revoked sooner.  Performed at [facility], [place] [address] [place], [place] [address]   MRSA Next Gen by PCR, Nasal     Status: None   Collection Time: 10/18/20  6:16 AM   Specimen: Nasal Mucosa; Nasal Swab  Result Value Ref Range Status   MRSA by PCR Next Gen NOT DETECTED NOT DETECTED Final    Comment: (NOTE) The GeneXpert MRSA Assay (FDA approved for NASAL specimens only), is one component of a comprehensive MRSA colonization surveillance program. It is not intended to diagnose MRSA infection nor to guide or monitor treatment for MRSA infections. Test performance is not FDA approved in patients less than 16 years old. Performed at [facility], [place] [address], [place], [place] [address]       Radiology Studies: No results found.  Scheduled Meds:  allopurinol  300 mg Oral Daily   amLODipine   10 mg Oral Daily   aspirin EC  81 mg Oral Daily   atorvastatin  80 mg Oral Daily   bictegravir-emtricitabine-tenofovir AF  1  tablet Oral Daily   carvedilol  25 mg Oral BID WC   dapsone  100 mg Oral Daily   enoxaparin (LOVENOX) injection  60 mg Subcutaneous Q24H   escitalopram  20 mg Oral Daily   gabapentin  100 mg Oral TID   icosapent Ethyl  2 g Oral BID   insulin aspart  0-15 Units Subcutaneous TID WC   insulin aspart  0-5 Units Subcutaneous QHS   insulin glargine  15 Units Subcutaneous Daily   linezolid  600 mg Oral Q12H   metroNIDAZOLE  500 mg Oral Q12H   oxybutynin  2.5 mg Oral BID   ticagrelor  90 mg Oral BID   Continuous Infusions:  ceFEPime (MAXIPIME) IV Stopped (10/22/20 0603)     LOS: 4 days   Time spent: 23mn   Corneluis Allston C Recardo Linn, DO [facility]  If 7PM-7AM, please contact night-coverage [email or web address]  10/22/2020, 7:57 AM

## 2020-10-22 NOTE — H&P (Signed)
Diabetes:  Uncontrolled; current treatment: Lantus 15U daily with moderate SSI   Current glucose readings: fasting an post-prandial glucose >200  Denies hypoglycemic sx   Currently on carb-modified diet    Plan:  Increased Lantus to 25U daily  Continue moderate SSI  F/u SSI needs    Consulted with: Dr. Holli Humbles. MD    Adria Dill, PharmD PGY-1 Acute Care Resident  10/22/2020 8:16 AM

## 2020-10-22 NOTE — Plan of Care (Signed)
  Problem: Health Behavior/Discharge Planning: Goal: Ability to manage health-related needs will improve Outcome: Progressing   Problem: Activity: Goal: Risk for activity intolerance will decrease Outcome: Progressing   Problem: Safety: Goal: Ability to remain free from injury will improve Outcome: Progressing   

## 2020-10-23 ENCOUNTER — Telehealth: Payer: Self-pay

## 2020-10-23 DIAGNOSIS — L03116 Cellulitis of left lower limb: Secondary | ICD-10-CM | POA: Diagnosis not present

## 2020-10-23 LAB — CULTURE, BLOOD (ROUTINE X 2): Culture: NO GROWTH

## 2020-10-23 LAB — BASIC METABOLIC PANEL
Anion gap: 8 (ref 5–15)
BUN: 19 mg/dL (ref 6–20)
CO2: 21 mmol/L — ABNORMAL LOW (ref 22–32)
Calcium: 8.9 mg/dL (ref 8.9–10.3)
Chloride: 103 mmol/L (ref 98–111)
Creatinine, Ser: 1.92 mg/dL — ABNORMAL HIGH (ref 0.61–1.24)
GFR, Estimated: 41 mL/min — ABNORMAL LOW (ref >60)
Glucose, Bld: 216 mg/dL — ABNORMAL HIGH (ref 70–99)
Potassium: 4.5 mmol/L (ref 3.5–5.1)
Sodium: 132 mmol/L — ABNORMAL LOW (ref 135–145)

## 2020-10-23 LAB — GLUCOSE, CAPILLARY
Glucose-Capillary: 231 mg/dL — ABNORMAL HIGH (ref 70–99)
Glucose-Capillary: 221 mg/dL — ABNORMAL HIGH (ref 70–99)
Glucose-Capillary: 222 mg/dL — ABNORMAL HIGH (ref 70–99)
Glucose-Capillary: 303 mg/dL — ABNORMAL HIGH (ref 70–99)

## 2020-10-23 LAB — CBC
HCT: 33.7 % — ABNORMAL LOW (ref 39.0–52.0)
Hemoglobin: 10.7 g/dL — ABNORMAL LOW (ref 13.0–17.0)
MCH: 26.7 pg (ref 26.0–34.0)
MCHC: 31.8 g/dL (ref 30.0–36.0)
MCV: 84 fL (ref 80.0–100.0)
Platelets: 371 10*3/uL (ref 150–400)
RBC: 4.01 MIL/uL — ABNORMAL LOW (ref 4.22–5.81)
RDW: 12.6 % (ref 11.5–15.5)
WBC: 8.6 10*3/uL (ref 4.0–10.5)
nRBC: 0 % (ref 0.0–0.2)

## 2020-10-23 LAB — MAGNESIUM: Magnesium: 1.8 mg/dL (ref 1.7–2.4)

## 2020-10-23 NOTE — Progress Notes (Signed)
Physical Therapy Treatment Patient Details Name: Keith [name] MRN: [id number] DOB: [birth year] Today's Date: 10/23/2020    History of Present Illness Keith [name] is a 55 y.o. male  who is sent to the ED by his orthopedic surgery provider due to concern for left heel ulcer that had failed outpatient therapy. with medical history significant for HIV, CKD 3B, type 2 diabetes, hypertension, hyperlipidemia, coronary artery disease, status post right below the knee amputation,    PT Comments    Continuing work on functional mobility and activity tolerance;  Took time this session to look closer at prosthesis fit -- pt is uncomfortable with looseness of prosthesis, and doesn't want to walk due to fear of falling; noted a lot of play in prosthesis fit, and pt doesn't have any extra socks to incr ply in the room; contacted pt's prosthetist, Ronalee Belts, [facility], and left a message; Would be great if Ronalee Belts could look at the fit and give some suggestion for better fit (more socks? Pt is wondering about getting another socket made?)  Able to stand to RW and march in place with minguard assist; Will bring second person next session for chair follow to see if that boosts pt's confidence for progressive amb  Follow Up Recommendations  SNF     Equipment Recommendations  Wheelchair (measurements PT);Wheelchair cushion (measurements PT)    Recommendations for Other Services       Precautions / Restrictions Precautions Precautions: Fall Precaution Comments: R BKA Required Braces or Orthoses: Other Brace Other Brace: LLE walking PRAFO Restrictions LLE Weight Bearing: Weight bearing as tolerated    Mobility  Bed Mobility                    Transfers Overall transfer level: Needs assistance Equipment used: Rolling walker (2 wheeled) Transfers: Sit to/from Stand Sit to Stand: Min guard;From elevated surface         General transfer comment: cues for hand placement; stood form recliner with  heavy dependence on UEs pushing up from armrests, then pushing ot upright sanding after switching to RW  Ambulation/Gait Ambulation/Gait assistance: Min guard Gait Distance (Feet):  (March in place) Assistive device: Rolling walker (2 wheeled) (in front of recliner)       General Gait Details: Noting good weight shift and foot clearance L and R; heavy dependence on RW for support; Pt very nervous about walking due to suboptimal R prosthesis fit   Stairs             Wheelchair Mobility    Modified Rankin (Stroke Patients Only)       Balance     Sitting balance-Leahy Scale: Good       Standing balance-Leahy Scale: Poor Standing balance comment: reliant on bil UE support with standing                            Cognition Arousal/Alertness: Awake/alert Behavior During Therapy: WFL for tasks assessed/performed Overall Cognitive Status: Within Functional Limits for tasks assessed                                        Exercises      General Comments General comments (skin integrity, edema, etc.): Took a closer look at pt's prosthesis fit, and noted a lot of space/play/wiggle room around knee; Able to fit 2 fingers between  knee and socket of prosthesis; Pt didn't have any extra plys of socks in room; Pt gave me permission to contact prosthetist to see about options for better fit      Pertinent Vitals/Pain Pain Assessment: Faces Faces Pain Scale: Hurts a little bit Pain Location: L foot/heel Pain Descriptors / Indicators: Aching    Home Living                      Prior Function            PT Goals (current goals can now be found in the care plan section) Acute Rehab PT Goals Patient Stated Goal: to ambulate without falling PT Goal Formulation: With patient Time For Goal Achievement: 11/01/20 Potential to Achieve Goals: Good Progress towards PT goals: Progressing toward goals    Frequency    Min 3X/week       PT Plan Current plan remains appropriate    Co-evaluation              AM-PAC PT "6 Clicks" Mobility   Outcome Measure  Help needed turning from your back to your side while in a flat bed without using bedrails?: None Help needed moving from lying on your back to sitting on the side of a flat bed without using bedrails?: None Help needed moving to and from a bed to a chair (including a wheelchair)?: A Little Help needed standing up from a chair using your arms (e.g., wheelchair or bedside chair)?: A Little Help needed to walk in hospital room?: A Lot Help needed climbing 3-5 steps with a railing? : A Lot 6 Click Score: 18    End of Session Equipment Utilized During Treatment: Gait belt Activity Tolerance: Patient tolerated treatment well Patient left: in chair;with call bell/phone within reach;with chair alarm set Nurse Communication: Mobility status PT Visit Diagnosis: Unsteadiness on feet (R26.81);Other abnormalities of gait and mobility (R26.89);Pain Pain - Right/Left: Left Pain - part of body: Ankle and joints of foot;Leg     Time: [id number] PT Time Calculation (min) (ACUTE ONLY): 12 min  Charges:  $Therapeutic Activity: 8-22 mins                     Roney Marion, PT  Acute Rehabilitation Services Pager [phone] Office [place] 10/23/2020, 4:37 PM

## 2020-10-23 NOTE — Telephone Encounter (Signed)
Patient currently working with [facility]. Updated on current admission to [facility] since 7/12.  Also making Dr. [name] aware.  Eugenia Mcalpine

## 2020-10-23 NOTE — Progress Notes (Signed)
Pharmacy Antibiotic Note  DUAINE LOCURTO is a 55 y.o. male admitted on 10/17/2020 with  wound infection .  Pt has been on doxycycline since 7/2 without improvement.  Pharmacy has been consulted for cefepime dosing.   Pt has been on cefepime and flagyl since 7/13. Wbc remains wnl. AF. MRI suggested early osteo.  D/w Dr Avon Gully and added empiric PO zyvox until he can be seen by [name] next week for assessment.   Plan: Cefepime 2g IV Q12H Linezolid '600mg'$  PO BID Flagyl 500 mg PO Q12H   Height: '5\' 10"'$  (177.8 cm) Weight: 124.8 kg (275 lb 2.2 oz) IBW/kg (Calculated) : 73  Temp (24hrs), Avg:98.2 F (36.8 C), Min:98 F (36.7 C), Max:98.4 F (36.9 C)  Recent Labs  Lab 10/17/20 1807 10/18/20 0405 10/18/20 0418 10/18/20 0805 10/19/20 0333 10/20/20 0333 10/21/20 0252 10/22/20 0320 10/23/20 0122  WBC 8.7  --   --    < > 9.1 8.6 8.3 8.5 8.6  CREATININE 2.19*   < >  --    < > 2.12* 2.02* 2.14* 1.97* 1.92*  LATICACIDVEN 2.0*  --  1.2  --   --   --   --   --   --    < > = values in this interval not displayed.     Estimated Creatinine Clearance: 57.6 mL/min (A) (by C-G formula based on SCr of 1.92 mg/dL (H)).    Allergies  Allergen Reactions   Elemental Sulfur Itching    Patient stated he's allergic to "sulfur" AND "sulfa"   Sulfa Antibiotics Itching   7/2 doxy x 7 days 7/13 vanc x 1 7/13 cefepime>> 7/13 Flagyl>> 7/15 zyvox>>   7/13 BCx: NGF  Laurey Arrow, PharmD PGY1 Pharmacy Resident 10/23/2020  10:41 AM  Please check [email or web address] for unit-specific pharmacy phone numbers.

## 2020-10-23 NOTE — Progress Notes (Signed)
Inpatient Rehab Admissions Coordinator:   Pt. Requesting SNF, CIR to sign off.   Clemens Catholic, [place], [place] Admissions Coordinator  [phone] ([place]) [phone] (office)

## 2020-10-23 NOTE — Progress Notes (Signed)
Occupational Therapy Treatment Patient Details Name: Keith [name] MRN: [id number] DOB: 03-May-1965 Today's Date: 10/23/2020    History of present illness Keith [name] is a 55 y.o. male  who is sent to the ED by his orthopedic surgery provider due to concern for left heel ulcer that had failed outpatient therapy. with medical history significant for HIV, CKD 3B, type 2 diabetes, hypertension, hyperlipidemia, coronary artery disease, status post right below the knee amputation,   OT comments  Pt. Was seen for ADLs while sitting EOB. Pt. Is making progress with ADLs. Pt. Is making steady gains with sit to stand and transfers. Pt. Has refused cir and is planning on snf. Acute OT to follow.   Follow Up Recommendations  SNF    Equipment Recommendations  None recommended by OT    Recommendations for Other Services      Precautions / Restrictions Precautions Precautions: Fall Precaution Comments: R BKA Required Braces or Orthoses: Other Brace Other Brace: LLE walking PRAFO Restrictions Weight Bearing Restrictions: Yes LLE Weight Bearing: Weight bearing as tolerated       Mobility Bed Mobility Overal bed mobility: Modified Independent                  Transfers Overall transfer level: Needs assistance Equipment used: Rolling walker (2 wheeled) Transfers: Stand Pivot Transfers Sit to Stand: Min guard;From elevated surface Stand pivot transfers: Min assist;From elevated surface       General transfer comment: cues for hand placement    Balance     Sitting balance-Leahy Scale: Good       Standing balance-Leahy Scale: Fair                             ADL either performed or assessed with clinical judgement   ADL Overall ADL's : Needs assistance/impaired Eating/Feeding: Set up;Sitting   Grooming: Set up;Sitting   Upper Body Bathing: Sitting;Set up;Supervision/ safety   Lower Body Bathing: Moderate assistance;Sit to/from stand   Upper Body  Dressing : Set up;Sitting   Lower Body Dressing: Moderate assistance;Sit to/from stand   Toilet Transfer: Minimal assistance;BSC;RW   Toileting- Clothing Manipulation and Hygiene: Moderate assistance;Sitting/lateral lean       Functional mobility during ADLs: Minimal assistance;Rolling walker General ADL Comments: Pt. with good sitting balance EOB for ADLS     Vision   Vision Assessment?: No apparent visual deficits   Perception     Praxis      Cognition Arousal/Alertness: Awake/alert Behavior During Therapy: WFL for tasks assessed/performed Overall Cognitive Status: Within Functional Limits for tasks assessed                                          Exercises     Shoulder Instructions       General Comments      Pertinent Vitals/ Pain       Pain Assessment: 0-10 Pain Score: 7  Pain Location: L heal Pain Descriptors / Indicators: Aching Pain Intervention(s): RN gave pain meds during session  Home Living                                          Prior Functioning/Environment  Frequency  Min 2X/week        Progress Toward Goals  OT Goals(current goals can now be found in the care plan section)  Progress towards OT goals: Progressing toward goals  Acute Rehab OT Goals Patient Stated Goal: to ambulate without falling OT Goal Formulation: With patient Time For Goal Achievement: 11/02/20 Potential to Achieve Goals: Good ADL Goals Pt Will Perform Upper Body Dressing: with set-up;sitting Pt Will Perform Lower Body Dressing: with min assist;sitting/lateral leans Pt Will Transfer to Toilet: with min assist;with transfer board;stand pivot transfer;squat pivot transfer Pt Will Perform Toileting - Clothing Manipulation and hygiene: with set-up;sitting/lateral leans  Plan Discharge plan remains appropriate    Co-evaluation                 AM-PAC OT "6 Clicks" Daily Activity     Outcome Measure    Help from another person eating meals?: None Help from another person taking care of personal grooming?: A Little Help from another person toileting, which includes using toliet, bedpan, or urinal?: A Lot Help from another person bathing (including washing, rinsing, drying)?: A Lot Help from another person to put on and taking off regular upper body clothing?: A Little Help from another person to put on and taking off regular lower body clothing?: A Lot 6 Click Score: 16    End of Session Equipment Utilized During Treatment: Gait belt;Rolling walker  OT Visit Diagnosis: Other abnormalities of gait and mobility (R26.89);Muscle weakness (generalized) (M62.81);Pain   Activity Tolerance Patient tolerated treatment well;Patient limited by fatigue   Patient Left in chair;with chair alarm set;with call bell/phone within reach   Nurse Communication Patient requests pain meds        Time: 1152-1226 OT Time Calculation (min): 34 min  Charges: OT General Charges $OT Visit: 1 Visit OT Treatments $Self Care/Home Management : 8-22 mins $Neuromuscular Re-education: 8-22 mins  [name] OT/L    Mehreen Azizi 10/23/2020, 12:34 PM

## 2020-10-23 NOTE — Progress Notes (Signed)
PROGRESS NOTE    Keith [name]  NIO:[id number] DOB: 1965-12-27 DOA: 10/17/2020 [name], NP   Brief Narrative:  Keith [name] is a 55 y.o. male with medical history significant for HIV, CKD 3B, type 2 diabetes, hypertension, hyperlipidemia, coronary artery disease, status post right below the knee amputation, who is sent to the ED by his orthopedic surgery provider due to concern for left heel ulcer that had failed outpatient therapy.  Associated with subjective fevers.  Patient was seen in the ED on 10/07/2020 and completed 7 days of oral doxycycline.  He followed up with orthopedic surgery and had another follow-up appointment earlier on the day of presentation where he was noted to have worsening left foot ulcer.  Patient was advised to come to the ED for IV antibiotics therapy.  Reports constant pain in his left foot.  Assessment & Plan:   Active Problems:   Elevated blood pressure reading with diagnosis of hypertension   Cellulitis   Ulcer of heel due to diabetes mellitus (HCC)   Renal insufficiency   Normochromic normocytic anemia   Left foot ulcer with concern for cellulitis, POA Failed outpatient therapy with 7-day course of doxycycline, started on 10/07/2020 with worsening symptoms Continue cefepime and IV Flagyl -Zyvox added 10/20/2020 due to MRSA history Patient sent in from orthopedic office (Dr Sharol Given) - appreciate insight and recs - will continue IV abx through the weekend and follow cultlures; no need for repeat imaging in the interim per discussion with ortho Wound care following No osteomyelitis or drainage abscess identified from MRI of the left foot done on 10/07/2020.  ESR 120; CRP 11 at intake Follow cultures - negative thus  far   Profoundly uncontrolled diabetes with hyperglycemia, improving A1C 14 - glucose more well controlled over the past 24h - continue to adjust to goal of 100-150 Lantus increased to 25u with improved results Continue increased sliding  scale; follow hypoglycemia protocol  Diabetes polyneuropathy Resume home regimen. Increase insulin - likely poor dietary compliance at home given profoundly elevated A1C   HIV Resume home HAART regimen Repeat labs for HAART regimen pending per pharmacy: HIV RNA Quant 130; Log10 HIV RNA 2.114  Chronic anxiety/depression Resume home regimen.  S/p R BKA with ambulatory dysfunction PT OT to follow   CKD 3B At baseline  DVT prophylaxis: Subcu Lovenox daily Code Status: Full code Family Communication: None at bedside Status is: inpt  Dispo: The patient is from: Home              Anticipated d/c is to: TBD              Anticipated d/c date is: >72h              Patient currently NOT medically stable for discharge  Consultants:  Orthopedic Surgery  Procedures:  TBD  Antimicrobials:  Cefepime/Metronidazole   Subjective: No acute issues/events overnight. Encouraged to ambulate - pain currently well controlled.  Objective: Vitals:   10/22/20 0921 10/22/20 2126 10/23/20 0539 10/23/20 0828  BP: 124/82 (!) 166/86 (!) 134/99 (!) 186/119  Pulse: 71 71 73 84  Resp: $Remo'18 18 17 16  'AJrcx$ Temp: 98.3 F (36.8 C) 98 F (36.7 C) 98.1 F (36.7 C) 98.4 F (36.9 C)  TempSrc: Oral Oral Oral Oral  SpO2: 98% 96% 100% 100%  Weight:      Height:        Intake/Output Summary (Last 24 hours) at 10/23/2020 0855 Last data filed at 10/23/2020  0840 Gross per 24 hour  Intake 880 ml  Output 2150 ml  Net -1270 ml    Filed Weights   10/18/20 2122 10/20/20 2107 10/22/20 0525  Weight: 127 kg 128 kg 124.8 kg    Examination: General: 55 y.o. year-old male well developed well nourished in no acute distress.  Alert and oriented x3. Cardiovascular: Regular rate and rhythm with no rubs or gallops.  No thyromegaly or JVD noted.  No lower extremity edema. 2/4 pulses in all 4 extremities. Respiratory: Clear to auscultation with no wheezes or rales. Good inspiratory effort. Abdomen: Soft nontender  nondistended with normal bowel sounds x4 quadrants. Muskuloskeletal: R BKA, left lower extremity 2+ pitting edema to knee with unstageable heel ulcer(see below) Neuro: CN II-XII intact, strength, sensation, reflexes Skin: LLE foot bandage clean/dry/intact  Psychiatry: Judgement and insight appear normal. Mood is appropriate for condition and setting  Data Reviewed: I have personally reviewed following labs and imaging studies  CBC: Recent Labs  Lab 10/17/20 1807 10/18/20 0805 10/19/20 0333 10/20/20 0333 10/21/20 0252 10/22/20 0320 10/23/20 0122  WBC 8.7   < > 9.1 8.6 8.3 8.5 8.6  NEUTROABS 6.4  --   --   --   --   --   --   HGB 10.6*   < > 9.8* 10.4* 10.2* 10.3* 10.7*  HCT 34.0*   < > 30.3* 32.2* 32.9* 32.3* 33.7*  MCV 85.0   < > 82.8 83.2 83.7 82.8 84.0  PLT 316   < > 341 364 371 376 371   < > = values in this interval not displayed.    Basic Metabolic Panel: Recent Labs  Lab 10/18/20 0436 10/18/20 0805 10/19/20 0333 10/20/20 0333 10/21/20 0252 10/22/20 0320 10/23/20 0122  NA  --    < > 132* 130* 132* 132* 132*  K  --    < > 4.3 4.3 4.4 4.7 4.5  CL  --    < > 102 98 102 101 103  CO2  --    < > 21* 24 21* 25 21*  GLUCOSE  --    < > 233* 304* 202* 249* 216*  BUN  --    < > 25* 24* 24* 19 19  CREATININE  --    < > 2.12* 2.02* 2.14* 1.97* 1.92*  CALCIUM  --    < > 8.9 8.8* 8.6* 8.8* 8.9  MG 1.9  --  1.8 2.0 1.8 1.8 1.8  PHOS 3.9  --   --   --   --   --   --    < > = values in this interval not displayed.    GFR: Estimated Creatinine Clearance: 57.6 mL/min (A) (by C-G formula based on SCr of 1.92 mg/dL (H)). Liver Function Tests: Recent Labs  Lab 10/17/20 1807  AST 15  ALT 13  ALKPHOS 85  BILITOT 0.7  PROT 7.8  ALBUMIN 2.4*    No results for input(s): LIPASE, AMYLASE in the last 168 hours. No results for input(s): AMMONIA in the last 168 hours. Coagulation Profile: No results for input(s): INR, PROTIME in the last 168 hours. Cardiac Enzymes: No results  for input(s): CKTOTAL, CKMB, CKMBINDEX, TROPONINI in the last 168 hours. BNP (last 3 results) No results for input(s): PROBNP in the last 8760 hours. HbA1C: No results for input(s): HGBA1C in the last 72 hours. CBG: Recent Labs  Lab 10/22/20 0634 10/22/20 1117 10/22/20 1702 10/22/20 2116 10/23/20 0639  GLUCAP 226* 285* 231*  220* 231*    Lipid Profile: No results for input(s): CHOL, HDL, LDLCALC, TRIG, CHOLHDL, LDLDIRECT in the last 72 hours. Thyroid Function Tests: No results for input(s): TSH, T4TOTAL, FREET4, T3FREE, THYROIDAB in the last 72 hours. Anemia Panel: No results for input(s): VITAMINB12, FOLATE, FERRITIN, TIBC, IRON, RETICCTPCT in the last 72 hours. Sepsis Labs: Recent Labs  Lab 10/17/20 1807 10/18/20 0418  LATICACIDVEN 2.0* 1.2     Recent Results (from the past 240 hour(s))  Blood culture (routine x 2)     Status: None   Collection Time: 10/17/20  6:30 PM   Specimen: BLOOD LEFT HAND  Result Value Ref Range Status   Specimen Description BLOOD LEFT HAND  Final   Special Requests   Final    BOTTLES DRAWN AEROBIC AND ANAEROBIC Blood Culture results may not be optimal due to an inadequate volume of blood received in culture bottles   Culture   Final    NO GROWTH 5 DAYS Performed at [facility], [place] [address] [place], [place] [address]    Report Status 10/22/2020 FINAL  Final  Blood culture (routine x 2)     Status: None   Collection Time: 10/18/20  4:39 AM   Specimen: BLOOD RIGHT WRIST  Result Value Ref Range Status   Specimen Description BLOOD RIGHT WRIST  Final   Special Requests   Final    BOTTLES DRAWN AEROBIC AND ANAEROBIC Blood Culture results may not be optimal due to an inadequate volume of blood received in culture bottles   Culture   Final    NO GROWTH 5 DAYS Performed at [facility], [place] [address], [place], [place] [address]    Report Status 10/23/2020 FINAL  Final  SARS CORONAVIRUS 2 (TAT 6-24 HRS) Nasopharyngeal  Nasopharyngeal Swab     Status: None   Collection Time: 10/18/20  5:23 AM   Specimen: Nasopharyngeal Swab  Result Value Ref Range Status   SARS Coronavirus 2 NEGATIVE NEGATIVE Final    Comment: (NOTE) SARS-CoV-2 target nucleic acids are NOT DETECTED.  The SARS-CoV-2 RNA is generally detectable in upper and lower respiratory specimens during the acute phase of infection. Negative results do not preclude SARS-CoV-2 infection, do not rule out co-infections with other pathogens, and should not be used as the sole basis for treatment or other patient management decisions. Negative results must be combined with clinical observations, patient history, and epidemiological information. The expected result is Negative.  Fact Sheet for Patients: SugarRoll.be  Fact Sheet for Healthcare Providers: https://www.woods-mathews.com/  This test is not yet approved or cleared by the [place] FDA and  has been authorized for detection and/or diagnosis of SARS-CoV-2 by FDA under an Emergency Use Authorization (EUA). This EUA will remain  in effect (meaning this test can be used) for the duration of the COVID-19 declaration under Se ction 564(b)(1) of the Act, 21 U.S.C. section 360bbb-3(b)(1), unless the authorization is terminated or revoked sooner.  Performed at [facility], [place] [address] [place], [place] [address]   MRSA Next Gen by PCR, Nasal     Status: None   Collection Time: 10/18/20  6:16 AM   Specimen: Nasal Mucosa; Nasal Swab  Result Value Ref Range Status   MRSA by PCR Next Gen NOT DETECTED NOT DETECTED Final    Comment: (NOTE) The GeneXpert MRSA Assay (FDA approved for NASAL specimens only), is one component of a comprehensive MRSA colonization surveillance program. It is not intended to diagnose  MRSA infection nor to guide or monitor treatment for MRSA infections. Test performance is not FDA approved in patients less than 22  years old. Performed at [facility], [place] [address], [place], [place] [address]       Radiology Studies: No results found.  Scheduled Meds:  allopurinol  300 mg Oral Daily   amLODipine  10 mg Oral Daily   aspirin EC  81 mg Oral Daily   atorvastatin  80 mg Oral Daily   bictegravir-emtricitabine-tenofovir AF  1 tablet Oral Daily   carvedilol  25 mg Oral BID WC   dapsone  100 mg Oral Daily   enoxaparin (LOVENOX) injection  60 mg Subcutaneous Q24H   escitalopram  20 mg Oral Daily   gabapentin  100 mg Oral TID   icosapent Ethyl  2 g Oral BID   insulin aspart  0-15 Units Subcutaneous TID WC   insulin aspart  0-5 Units Subcutaneous QHS   insulin glargine  25 Units Subcutaneous Daily   linezolid  600 mg Oral Q12H   metroNIDAZOLE  500 mg Oral Q12H   oxybutynin  2.5 mg Oral BID   ticagrelor  90 mg Oral BID   Continuous Infusions:  ceFEPime (MAXIPIME) IV 2 g (10/23/20 0420)     LOS: 5 days   Time spent: 75min   Little Ishikawa, DO [facility]  If 7PM-7AM, please contact night-coverage [email or web address]  10/23/2020, 8:55 AM

## 2020-10-24 DIAGNOSIS — L03116 Cellulitis of left lower limb: Secondary | ICD-10-CM | POA: Diagnosis not present

## 2020-10-24 LAB — CBC
HCT: 32.8 % — ABNORMAL LOW (ref 39.0–52.0)
Hemoglobin: 10.3 g/dL — ABNORMAL LOW (ref 13.0–17.0)
MCH: 26.5 pg (ref 26.0–34.0)
MCHC: 31.4 g/dL (ref 30.0–36.0)
MCV: 84.5 fL (ref 80.0–100.0)
Platelets: 381 10*3/uL (ref 150–400)
RBC: 3.88 MIL/uL — ABNORMAL LOW (ref 4.22–5.81)
RDW: 13 % (ref 11.5–15.5)
WBC: 8.4 10*3/uL (ref 4.0–10.5)
nRBC: 0 % (ref 0.0–0.2)

## 2020-10-24 LAB — GLUCOSE, CAPILLARY
Glucose-Capillary: 228 mg/dL — ABNORMAL HIGH (ref 70–99)
Glucose-Capillary: 267 mg/dL — ABNORMAL HIGH (ref 70–99)
Glucose-Capillary: 241 mg/dL — ABNORMAL HIGH (ref 70–99)
Glucose-Capillary: 172 mg/dL — ABNORMAL HIGH (ref 70–99)

## 2020-10-24 LAB — BASIC METABOLIC PANEL
Anion gap: 8 (ref 5–15)
BUN: 25 mg/dL — ABNORMAL HIGH (ref 6–20)
CO2: 22 mmol/L (ref 22–32)
Calcium: 9.1 mg/dL (ref 8.9–10.3)
Chloride: 102 mmol/L (ref 98–111)
Creatinine, Ser: 2.14 mg/dL — ABNORMAL HIGH (ref 0.61–1.24)
GFR, Estimated: 36 mL/min — ABNORMAL LOW (ref >60)
Glucose, Bld: 222 mg/dL — ABNORMAL HIGH (ref 70–99)
Potassium: 4.8 mmol/L (ref 3.5–5.1)
Sodium: 132 mmol/L — ABNORMAL LOW (ref 135–145)

## 2020-10-24 LAB — MAGNESIUM: Magnesium: 1.8 mg/dL (ref 1.7–2.4)

## 2020-10-24 NOTE — Progress Notes (Signed)
PROGRESS NOTE    Keith [name]  JQG:[id number] DOB: Aug 16, 1965 DOA: 10/17/2020 [name], NP   Brief Narrative:  Keith [name] is a 55 y.o. male with medical history significant for HIV, CKD 3B, type 2 diabetes, hypertension, hyperlipidemia, coronary artery disease, status post right below the knee amputation, who is sent to the ED by his orthopedic surgery provider due to concern for left heel ulcer that had failed outpatient therapy.  Associated with subjective fevers.  Patient was seen in the ED on 10/07/2020 and completed 7 days of oral doxycycline.  He followed up with orthopedic surgery and had another follow-up appointment earlier on the day of presentation where he was noted to have worsening left foot ulcer.  Patient was advised to come to the ED for IV antibiotics therapy.  Reports constant pain in his left foot.  Assessment & Plan:   Active Problems:   Elevated blood pressure reading with diagnosis of hypertension   Cellulitis   Ulcer of heel due to diabetes mellitus (HCC)   Renal insufficiency   Normochromic normocytic anemia   Left foot ulcer with concern for cellulitis, POA Failed outpatient therapy with 7-day course of doxycycline, started on 10/07/2020 with worsening symptoms Continue cefepime and IV Flagyl -Zyvox added 10/20/2020 due to MRSA history Patient sent in from orthopedic office (Dr Sharol Given) - appreciate insight and recs - will continue IV abx through the weekend and follow cultlures; no need for repeat imaging in the interim per discussion with ortho Wound care following No osteomyelitis or drainage abscess identified from MRI of the left foot done on 10/07/2020.  ESR 120; CRP 11 at intake Follow cultures - negative thus far   Profoundly uncontrolled diabetes with hyperglycemia, improving A1C 14 - glucose more well controlled over the past 24h - continue to adjust to goal of 100-150 Lantus increased to 25u with improved results Continue increased sliding  scale; follow hypoglycemia protocol  Diabetes polyneuropathy Resume home regimen. Increase insulin - likely poor dietary compliance at home given profoundly elevated A1C   HIV Resume home HAART regimen Repeat labs for HAART regimen pending per pharmacy: HIV RNA Quant 130; Log10 HIV RNA 2.114  Chronic anxiety/depression Resume home regimen.  S/p R BKA with ambulatory dysfunction PT OT to follow   CKD 3B At baseline  DVT prophylaxis: Subcu Lovenox daily Code Status: Full code Family Communication: None at bedside Status is: inpt  Dispo: The patient is from: Home              Anticipated d/c is to: TBD              Anticipated d/c date is: >72h              Patient currently NOT medically stable for discharge  Consultants:  Orthopedic Surgery  Procedures:  TBD  Antimicrobials:  Cefepime/Metronidazole   Subjective: No acute issues/events overnight. Encouraged to ambulate - pain currently well controlled.  Objective: Vitals:   10/23/20 1644 10/23/20 2342 10/24/20 0618 10/24/20 0845  BP: 114/68 95/64 130/77 (!) 141/89  Pulse: 74 65 72 80  Resp: _0 Temp: 98.2 F (36.8 C) 97.6 F (36.4 C) 97.7 F (36.5 C) 97.8 F (36.6 C)  TempSrc: Oral Oral Oral Oral  SpO2: 97% 98% 95% 99%  Weight:      Height:        Intake/Output Summary (Last 24 hours) at 10/24/2020 1504 Last data filed at 10/24/2020 1300 Gross per  24 hour  Intake 560 ml  Output 1110 ml  Net -550 ml    Filed Weights   10/18/20 2122 10/20/20 2107 10/22/20 0525  Weight: 127 kg 128 kg 124.8 kg    Examination: General: 55 y.o. year-old male well developed well nourished in no acute distress.  Alert and oriented x3. Cardiovascular: Regular rate and rhythm with no rubs or gallops.  No thyromegaly or JVD noted.  No lower extremity edema. 2/4 pulses in all 4 extremities. Respiratory: Clear to auscultation with no wheezes or rales. Good inspiratory effort. Abdomen: Soft nontender nondistended with  normal bowel sounds x4 quadrants. Muskuloskeletal: R BKA, left lower extremity 2+ pitting edema to knee with unstageable heel ulcer(see below) Neuro: CN II-XII intact, strength, sensation, reflexes Skin: LLE foot bandage clean/dry/intact  Psychiatry: Judgement and insight appear normal. Mood is appropriate for condition and setting  Data Reviewed: I have personally reviewed following labs and imaging studies  CBC: Recent Labs  Lab 10/17/20 1807 10/18/20 0805 10/20/20 0333 10/21/20 0252 10/22/20 0320 10/23/20 0122 10/24/20 0457  WBC 8.7   < > 8.6 8.3 8.5 8.6 8.4  NEUTROABS 6.4  --   --   --   --   --   --   HGB 10.6*   < > 10.4* 10.2* 10.3* 10.7* 10.3*  HCT 34.0*   < > 32.2* 32.9* 32.3* 33.7* 32.8*  MCV 85.0   < > 83.2 83.7 82.8 84.0 84.5  PLT 316   < > 364 371 376 371 381   < > = values in this interval not displayed.    Basic Metabolic Panel: Recent Labs  Lab 10/18/20 0436 10/18/20 0805 10/20/20 0333 10/21/20 0252 10/22/20 0320 10/23/20 0122 10/24/20 0457  NA  --    < > 130* 132* 132* 132* 132*  K  --    < > 4.3 4.4 4.7 4.5 4.8  CL  --    < > 98 102 101 103 102  CO2  --    < > 24 21* 25 21* 22  GLUCOSE  --    < > 304* 202* 249* 216* 222*  BUN  --    < > 24* 24* 19 19 25*  CREATININE  --    < > 2.02* 2.14* 1.97* 1.92* 2.14*  CALCIUM  --    < > 8.8* 8.6* 8.8* 8.9 9.1  MG 1.9   < > 2.0 1.8 1.8 1.8 1.8  PHOS 3.9  --   --   --   --   --   --    < > = values in this interval not displayed.    GFR: Estimated Creatinine Clearance: 51.7 mL/min (A) (by C-G formula based on SCr of 2.14 mg/dL (H)). Liver Function Tests: Recent Labs  Lab 10/17/20 1807  AST 15  ALT 13  ALKPHOS 85  BILITOT 0.7  PROT 7.8  ALBUMIN 2.4*    No results for input(s): LIPASE, AMYLASE in the last 168 hours. No results for input(s): AMMONIA in the last 168 hours. Coagulation Profile: No results for input(s): INR, PROTIME in the last 168 hours. Cardiac Enzymes: No results for input(s):  CKTOTAL, CKMB, CKMBINDEX, TROPONINI in the last 168 hours. BNP (last 3 results) No results for input(s): PROBNP in the last 8760 hours. HbA1C: No results for input(s): HGBA1C in the last 72 hours. CBG: Recent Labs  Lab 10/23/20 1223 10/23/20 1625 10/23/20 2158 10/24/20 0619 10/24/20 1135  GLUCAP 221* 222* 303* 228* 267*  Lipid Profile: No results for input(s): CHOL, HDL, LDLCALC, TRIG, CHOLHDL, LDLDIRECT in the last 72 hours. Thyroid Function Tests: No results for input(s): TSH, T4TOTAL, FREET4, T3FREE, THYROIDAB in the last 72 hours. Anemia Panel: No results for input(s): VITAMINB12, FOLATE, FERRITIN, TIBC, IRON, RETICCTPCT in the last 72 hours. Sepsis Labs: Recent Labs  Lab 10/17/20 1807 10/18/20 0418  LATICACIDVEN 2.0* 1.2     Recent Results (from the past 240 hour(s))  Blood culture (routine x 2)     Status: None   Collection Time: 10/17/20  6:30 PM   Specimen: BLOOD LEFT HAND  Result Value Ref Range Status   Specimen Description BLOOD LEFT HAND  Final   Special Requests   Final    BOTTLES DRAWN AEROBIC AND ANAEROBIC Blood Culture results may not be optimal due to an inadequate volume of blood received in culture bottles   Culture   Final    NO GROWTH 5 DAYS Performed at [facility], [place] [address] [place], [place] [address]    Report Status 10/22/2020 FINAL  Final  Blood culture (routine x 2)     Status: None   Collection Time: 10/18/20  4:39 AM   Specimen: BLOOD RIGHT WRIST  Result Value Ref Range Status   Specimen Description BLOOD RIGHT WRIST  Final   Special Requests   Final    BOTTLES DRAWN AEROBIC AND ANAEROBIC Blood Culture results may not be optimal due to an inadequate volume of blood received in culture bottles   Culture   Final    NO GROWTH 5 DAYS Performed at [facility], [place] [address] [place], [place] [address]    Report Status 10/23/2020 FINAL  Final  SARS CORONAVIRUS 2 (TAT 6-24 HRS) Nasopharyngeal Nasopharyngeal Swab      Status: None   Collection Time: 10/18/20  5:23 AM   Specimen: Nasopharyngeal Swab  Result Value Ref Range Status   SARS Coronavirus 2 NEGATIVE NEGATIVE Final    Comment: (NOTE) SARS-CoV-2 target nucleic acids are NOT DETECTED.  The SARS-CoV-2 RNA is generally detectable in upper and lower respiratory specimens during the acute phase of infection. Negative results do not preclude SARS-CoV-2 infection, do not rule out co-infections with other pathogens, and should not be used as the sole basis for treatment or other patient management decisions. Negative results must be combined with clinical observations, patient history, and epidemiological information. The expected result is Negative.  Fact Sheet for Patients: SugarRoll.be  Fact Sheet for Healthcare Providers: https://www.woods-mathews.com/  This test is not yet approved or cleared by the [place] FDA and  has been authorized for detection and/or diagnosis of SARS-CoV-2 by FDA under an Emergency Use Authorization (EUA). This EUA will remain  in effect (meaning this test can be used) for the duration of the COVID-19 declaration under Se ction 564(b)(1) of the Act, 21 U.S.C. section 360bbb-3(b)(1), unless the authorization is terminated or revoked sooner.  Performed at [facility], [place] [address], [place], [place] [address]   MRSA Next Gen by PCR, Nasal     Status: None   Collection Time: 10/18/20  6:16 AM   Specimen: Nasal Mucosa; Nasal Swab  Result Value Ref Range Status   MRSA by PCR Next Gen NOT DETECTED NOT DETECTED Final    Comment: (NOTE) The GeneXpert MRSA Assay (FDA approved for NASAL specimens only), is one component of a comprehensive MRSA colonization surveillance program. It is not intended to diagnose MRSA infection nor to guide  or monitor treatment for MRSA infections. Test performance is not FDA approved in patients less than 68 years old. Performed at  [facility], [place] [address], [place], [place] [address]       Radiology Studies: No results found.  Scheduled Meds:  allopurinol  300 mg Oral Daily   amLODipine  10 mg Oral Daily   aspirin EC  81 mg Oral Daily   atorvastatin  80 mg Oral Daily   bictegravir-emtricitabine-tenofovir AF  1 tablet Oral Daily   carvedilol  25 mg Oral BID WC   dapsone  100 mg Oral Daily   enoxaparin (LOVENOX) injection  60 mg Subcutaneous Q24H   escitalopram  20 mg Oral Daily   gabapentin  100 mg Oral TID   icosapent Ethyl  2 g Oral BID   insulin aspart  0-15 Units Subcutaneous TID WC   insulin aspart  0-5 Units Subcutaneous QHS   insulin glargine  25 Units Subcutaneous Daily   linezolid  600 mg Oral Q12H   metroNIDAZOLE  500 mg Oral Q12H   oxybutynin  2.5 mg Oral BID   ticagrelor  90 mg Oral BID   Continuous Infusions:  ceFEPime (MAXIPIME) IV 2 g (10/24/20 0500)     LOS: 6 days   Time spent: 91mn   Keith Weatherly C Jazell Rosenau, DO [facility]  If 7PM-7AM, please contact night-coverage [email or web address]  10/24/2020, 3:04 PM

## 2020-10-24 NOTE — Progress Notes (Signed)
Inpatient Diabetes Program Recommendations  AACE/ADA: New Consensus Statement on Inpatient Glycemic Control (2015)  Target Ranges:  Prepandial:   less than 140 mg/dL      Peak postprandial:   less than 180 mg/dL (1-2 hours)      Critically ill patients:  140 - 180 mg/dL   Lab Results  Component Value Date   GLUCAP 228 (H) 10/24/2020   HGBA1C 13.9 (H) 08/29/2020    Review of Glycemic Control Results for Keith [name], Keith [name] (MRN [name]) as of 10/24/2020 10:40  Ref. Range 10/23/2020 12:23 10/23/2020 16:25 10/23/2020 21:58 10/24/2020 06:19  Glucose-Capillary Latest Ref Range: 70 - 99 mg/dL 221 (H) 222 (H) 303 (H) 228 (H)   Diabetes history: Type 2 DM Outpatient Diabetes medications: Novolog 70/30 30 units BID, Metformin 1000 mg BID Current orders for Inpatient glycemic control: Lantus 25 units QD, Novolog 0-15 units TID, Novolog 0-5 units QHS   Inpatient Diabetes Program Recommendations:     Consider further increasing Lantus to 30 units QD and adding Novolog 5 units TID (assuming patient is consuming >50% of meals).  Thanks, Bronson Curb, MSN, RNC-OB Diabetes Coordinator [phone] (8a-5p)

## 2020-10-25 ENCOUNTER — Ambulatory Visit: Payer: Medicare HMO | Admitting: Physician Assistant

## 2020-10-25 DIAGNOSIS — L03116 Cellulitis of left lower limb: Secondary | ICD-10-CM | POA: Diagnosis not present

## 2020-10-25 DIAGNOSIS — L97424 Non-pressure chronic ulcer of left heel and midfoot with necrosis of bone: Secondary | ICD-10-CM | POA: Diagnosis not present

## 2020-10-25 DIAGNOSIS — M86172 Other acute osteomyelitis, left ankle and foot: Secondary | ICD-10-CM

## 2020-10-25 DIAGNOSIS — E11621 Type 2 diabetes mellitus with foot ulcer: Secondary | ICD-10-CM

## 2020-10-25 LAB — BASIC METABOLIC PANEL
Anion gap: 7 (ref 5–15)
BUN: 24 mg/dL — ABNORMAL HIGH (ref 6–20)
CO2: 25 mmol/L (ref 22–32)
Calcium: 9.1 mg/dL (ref 8.9–10.3)
Chloride: 100 mmol/L (ref 98–111)
Creatinine, Ser: 2.03 mg/dL — ABNORMAL HIGH (ref 0.61–1.24)
GFR, Estimated: 38 mL/min — ABNORMAL LOW (ref >60)
Glucose, Bld: 225 mg/dL — ABNORMAL HIGH (ref 70–99)
Potassium: 4.4 mmol/L (ref 3.5–5.1)
Sodium: 132 mmol/L — ABNORMAL LOW (ref 135–145)

## 2020-10-25 LAB — CBC
HCT: 33.3 % — ABNORMAL LOW (ref 39.0–52.0)
Hemoglobin: 10.4 g/dL — ABNORMAL LOW (ref 13.0–17.0)
MCH: 26.1 pg (ref 26.0–34.0)
MCHC: 31.2 g/dL (ref 30.0–36.0)
MCV: 83.5 fL (ref 80.0–100.0)
Platelets: 427 10*3/uL — ABNORMAL HIGH (ref 150–400)
RBC: 3.99 MIL/uL — ABNORMAL LOW (ref 4.22–5.81)
RDW: 13 % (ref 11.5–15.5)
WBC: 7.8 10*3/uL (ref 4.0–10.5)
nRBC: 0 % (ref 0.0–0.2)

## 2020-10-25 LAB — GLUCOSE, CAPILLARY
Glucose-Capillary: 227 mg/dL — ABNORMAL HIGH (ref 70–99)
Glucose-Capillary: 178 mg/dL — ABNORMAL HIGH (ref 70–99)
Glucose-Capillary: 235 mg/dL — ABNORMAL HIGH (ref 70–99)
Glucose-Capillary: 204 mg/dL — ABNORMAL HIGH (ref 70–99)

## 2020-10-25 LAB — MAGNESIUM: Magnesium: 1.8 mg/dL (ref 1.7–2.4)

## 2020-10-25 MED ORDER — INSULIN GLARGINE 100 UNIT/ML ~~LOC~~ SOLN
35.0000 [IU] | Freq: Every day | SUBCUTANEOUS | Status: DC
  Administered 2020-10-25 – 2020-11-03 (×10): 35 [IU] via SUBCUTANEOUS
  Filled 2020-10-25 (×11): qty 0.35

## 2020-10-25 NOTE — H&P (View-Only) (Signed)
ORTHOPAEDIC CONSULTATION  REQUESTING PHYSICIAN: Little Ishikawa, MD  Chief Complaint: Painful gangrenous ulcer left heel.  HPI: Keith [name] is a 55 y.o. male who presents with uncontrolled type 2 diabetes status post right transtibial amputation who presents with a chronic left heel decubitus ulcer with pain odor and drainage.  Past Medical History:  Diagnosis Date   Abscess of right foot    abscess/ulcer of R transtibial amputation requiring IV abx   AIDS ([place])    Anemia    CAD (coronary artery disease)    a. MI with stenting of OM1 in 11/2018 with residual disease. b. acute STEMI 03/2020 s/p DES to OM1   Chronic anemia    Chronic knee pain    right   Chronic pain    CKD (chronic kidney disease), stage IV (HCC)    CVA (cerebral vascular accident) ([place]) 04/2020   Diabetes type 2, uncontrolled ([place])    HgA1c 17.6 (04/27/2010)   Diabetic foot ulcer ([place]) 01/2017   right foot   Dilatation of aorta (HCC)    Erectile dysfunction    Genital warts    HIV (human immunodeficiency virus infection) ([place]) 2009   CD4 count 100, VL 13800 (05/01/2010)   Hyperlipidemia    Hypertension    Noncompliance with medication regimen    Osteomyelitis ([place])    h/o hand   Osteomyelitis of metatarsal ([place]) 04/28/2017   Past Surgical History:  Procedure Laterality Date   AMPUTATION Right 05/02/2017   Procedure: AMPUTATION TRANSMETARSAL;  Surgeon: [name], MD;  Location: [facility];  Service: Orthopedics;  Laterality: Right;   AMPUTATION Right 06/13/2017   Procedure: RIGHT BELOW KNEE AMPUTATION;  Surgeon: [name], MD;  Location: [facility];  Service: Orthopedics;  Laterality: Right;   BELOW KNEE LEG AMPUTATION Right 06/13/2017   BELOW KNEE LEG AMPUTATION     CORONARY STENT INTERVENTION N/A 11/10/2018   Procedure: CORONARY STENT INTERVENTION;  Surgeon: Leonie Man, MD;  Location: [facility];  Service: Cardiovascular;  Laterality: N/A;   CORONARY/GRAFT ACUTE MI  REVASCULARIZATION N/A 03/21/2020   Procedure: Coronary/Graft Acute MI Revascularization;  Surgeon: [name], MD;  Location: [facility];  Service: Cardiovascular;  Laterality: N/A;   HERNIA REPAIR     I & D EXTREMITY Left 08/21/2014   Procedure: INCISION AND DRAINAGE LEFT SMALL FINGER;  Surgeon: Leanora Cover, MD;  Location: [facility];  Service: Orthopedics;  Laterality: Left;   I & D EXTREMITY Right 03/18/2017   Procedure: IRRIGATION AND DEBRIDEMENT EXTREMITY;  Surgeon: [name], MD;  Location: [facility];  Service: Orthopedics;  Laterality: Right;   LEFT HEART CATH AND CORONARY ANGIOGRAPHY N/A 11/10/2018   Procedure: LEFT HEART CATH AND CORONARY ANGIOGRAPHY;  Surgeon: Leonie Man, MD;  Location: [facility];  Service: Cardiovascular;  Laterality: N/A;   LEFT HEART CATH AND CORONARY ANGIOGRAPHY N/A 03/21/2020   Procedure: LEFT HEART CATH AND CORONARY ANGIOGRAPHY;  Surgeon: [name], MD;  Location: [facility];  Service: Cardiovascular;  Laterality: N/A;   MINOR IRRIGATION AND DEBRIDEMENT OF WOUND Right 04/22/2014   Procedure: IRRIGATION AND DEBRIDEMENT OF RIGHT NECK ABCESS;  Surgeon: Jerrell Belfast, MD;  Location: [facility];  Service: ENT;  Laterality: Right;   MULTIPLE EXTRACTIONS WITH ALVEOLOPLASTY N/A 01/18/2013   Procedure: MULTIPLE EXTRACION 3, 6, 7, 10, 11, 13, 21, 22, 27, 28, 29, 30 WITH ALVEOLOPLASTY;  Surgeon: Gae Bon, DDS;  Location: [facility]  OR;  Service: Oral Surgery;  Laterality: N/A;   SKIN SPLIT GRAFT Right 03/21/2017   Procedure: IRRIGATION AND DEBRIDEMENT RIGHT FOOT AND APPLY SPLIT THICKNESS SKIN GRAFT AND WOUND VAC;  Surgeon: [name], MD;  Location: [facility];  Service: Orthopedics;  Laterality: Right;   TEE WITHOUT CARDIOVERSION N/A 05/02/2017   Procedure: TRANSESOPHAGEAL ECHOCARDIOGRAM (TEE);  Surgeon: Acie Fredrickson Wonda Cheng, MD;  Location: [facility] OR;  Service: Cardiovascular;  Laterality: N/A;  coincidental to orthopedic case   Social History    Socioeconomic History   Marital status: Single    Spouse name: Not on file   Number of children: 3   Years of education: 12   Highest education level: Not on file  Occupational History   Occupation: disabled  Tobacco Use   Smoking status: Never   Smokeless tobacco: Never  Vaping Use   Vaping Use: Never used  Substance and Sexual Activity   Alcohol use: Not Currently   Drug use: Not Currently    Comment: OTC ASA   Sexual activity: Yes    Partners: Female  Other Topics Concern   Not on file  Social History Narrative   ** Merged History Encounter **       Worked for the city of [place] for 18 years.   Unemployed.    Applying for disability.   Medicaid patient.   Social Determinants of Health   Financial Resource Strain: Low Risk    Difficulty of Paying Living Expenses: Not hard at all  Food Insecurity: No Food Insecurity   Worried About Charity fundraiser in the Last Year: Never true   Arboriculturist in the Last Year: Never true  Transportation Needs: Public librarian (Medical): No   Lack of Transportation (Non-Medical): Yes  Physical Activity: Not on file  Stress: Not on file  Social Connections: Not on file   Family History  Problem Relation Age of Onset   Hypertension Mother    Arthritis Father    Hypertension Father    Hypertension Brother    Cancer Maternal Grandmother 67       unknown type of cancer   Depression Paternal Grandmother    - negative except otherwise stated in the family history section Allergies  Allergen Reactions   Elemental Sulfur Itching    Patient stated he's allergic to "sulfur" AND "sulfa"   Sulfa Antibiotics Itching   Prior to Admission medications   Medication Sig Start Date End Date Taking? Authorizing Provider  acetaminophen (TYLENOL) 325 MG tablet Take 2 tablets (650 mg total) by mouth every 4 (four) hours as needed for mild pain, fever or headache. 05/03/20  Yes Debbe Odea, MD   allopurinol (ZYLOPRIM) 300 MG tablet Take 300 mg by mouth daily. 06/09/20  Yes [provider]  ALPRAZolam [name]) 1 MG tablet Take 1 mg by mouth daily as needed for anxiety. 08/15/20  Yes [provider]  amLODipine (NORVASC) 10 MG tablet Take 1 tablet (10 mg total) by mouth daily. 09/01/20 10/31/20 Yes [name], MD  aspirin 81 MG EC tablet Take 1 tablet (81 mg total) by mouth daily. 03/27/20  Yes [name], MD  atorvastatin (LIPITOR) 80 MG tablet Take 1 tablet (80 mg total) by mouth daily. 09/01/20 10/31/20 Yes [name], MD  bictegravir-emtricitabine-tenofovir AF (BIKTARVY) 50-200-25 MG TABS tablet Take 1 tablet by mouth daily. 05/19/20  Yes [name], MD  blood glucose meter kit and supplies Dispense  based on patient and insurance preference. Use up to four times daily as directed. (FOR ICD-10 E10.9, E11.9). 10/17/20  Yes [name], NP  carvedilol (COREG) 25 MG tablet Take 1 tablet (25 mg total) by mouth 2 (two) times daily with a meal. 09/01/20 10/31/20 Yes Kc, Ramesh, MD  dapsone 100 MG tablet TAKE 1 TABLET (100 MG TOTAL) BY MOUTH DAILY. Patient taking differently: Take 100 mg by mouth daily. 03/27/20 03/27/21 Yes [name], Ava, DO  escitalopram (LEXAPRO) 20 MG tablet Take 20 mg by mouth daily. 08/15/20  Yes [provider]  gabapentin (NEURONTIN) 100 MG capsule Take 1 capsule (100 mg total) by mouth 3 (three) times daily. 09/01/20 10/31/20 Yes [name], MD  glucose blood (COOL BLOOD GLUCOSE TEST STRIPS) test strip Use as instructed 02/16/20  Yes [name], FNP  icosapent Ethyl (VASCEPA) 1 g capsule TAKE 2 CAPSULES (2 G TOTAL) BY MOUTH TWO TIMES DAILY. Patient taking differently: Take 2 g by mouth 2 (two) times daily. 03/27/20 03/27/21 Yes [name], MD  insulin aspart protamine- aspart (NOVOLOG MIX 70/30) (70-30) 100 UNIT/ML injection Inject 0.3 mLs (30 Units total) into the skin 2 (two) times daily with a meal. With breakfast and  dinner. Patient taking differently: No sig reported 10/01/20 12/30/20 Yes Enzo Bi, MD  lisinopril (ZESTRIL) 5 MG tablet Take 5 mg by mouth daily.   Yes [provider]  metFORMIN (GLUCOPHAGE) 1000 MG tablet Take 1,000 mg by mouth 2 (two) times daily. 09/05/20  Yes [provider]  nitroGLYCERIN (NITROSTAT) 0.4 MG SL tablet PLACE 1 TABLET (0.4 MG TOTAL) UNDER THE TONGUE EVERY FIVE MINUTES X 3 DOSES AS NEEDED FOR CHEST PAIN. Patient taking differently: Place 0.4 mg under the tongue every 5 (five) minutes x 3 doses as needed for chest pain. 03/27/20 03/27/21 Yes [name], Ava, DO  oxybutynin (DITROPAN) 5 MG tablet TAKE 1 TABLET (5 MG TOTAL) BY MOUTH TWO TIMES DAILY. Patient taking differently: Take 5 mg by mouth 2 (two) times daily. 03/27/20 03/27/21 Yes [name], MD  oxyCODONE 10 MG TABS Take 1 tablet (10 mg total) by mouth every 6 (six) hours as needed for severe pain (Severe pain.). 05/03/20  Yes Debbe Odea, MD  ticagrelor (BRILINTA) 90 MG TABS tablet Take 90 mg by mouth 2 (two) times daily.   Yes [provider]  traZODone (DESYREL) 100 MG tablet Take 100 mg by mouth at bedtime. 09/11/20  Yes [provider]  doxycycline (VIBRAMYCIN) 100 MG capsule Take 1 capsule (100 mg total) by mouth 2 (two) times daily. One po bid x 7 days Patient not taking: Reported on 10/18/2020 10/07/20   Sherwood Gambler, MD  Lancets [facility] ULTRASOFT) lancets Use as instructed 02/16/20   [name], FNP   No results found. - pertinent xrays, CT, MRI studies were reviewed and independently interpreted  Positive ROS: All other systems have been reviewed and were otherwise negative with the exception of those mentioned in the HPI and as above.  Physical Exam: General: Alert, no acute distress Psychiatric: Patient is competent for consent with normal mood and affect Lymphatic: No axillary or cervical lymphadenopathy Cardiovascular: No pedal edema Respiratory: No cyanosis,  no use of accessory musculature GI: No organomegaly, abdomen is soft and non-tender    Images:  _0 @  Labs:  Lab Results  Component Value Date   HGBA1C 13.9 (H) 08/29/2020   HGBA1C 10.6 (H) 05/19/2020   HGBA1C >15.5 (H) 03/24/2020   ESRSEDRATE 128 (  H) 10/18/2020   ESRSEDRATE 85 (H) 10/07/2020   ESRSEDRATE 130 (H) 11/02/2018   CRP 11.2 (H) 10/18/2020   CRP 6.7 (H) 10/07/2020   CRP <0.8 11/02/2018   LABURIC 6.3 08/07/2009   REPTSTATUS 10/23/2020 FINAL 10/18/2020   GRAMSTAIN  04/29/2017    NO WBC SEEN RARE GRAM POSITIVE COCCI IN PAIRS IN SINGLES    CULT  10/18/2020    NO GROWTH 5 DAYS Performed at [facility], [place] [address] [place], [place] [address]    LABORGA METHICILLIN RESISTANT STAPHYLOCOCCUS AUREUS 04/29/2017    Lab Results  Component Value Date   ALBUMIN 2.4 (L) 10/17/2020   ALBUMIN 3.1 (L) 10/07/2020   ALBUMIN 3.1 (L) 09/29/2020   PREALBUMIN 25.5 06/06/2018   PREALBUMIN 7.4 (L) 03/15/2017   LABURIC 6.3 08/07/2009     CBC EXTENDED Latest Ref Rng & Units 10/25/2020 10/24/2020 10/23/2020  WBC 4.0 - 10.5 K/uL 7.8 8.4 8.6  RBC 4.22 - 5.81 MIL/uL 3.99(L) 3.88(L) 4.01(L)  HGB 13.0 - 17.0 g/dL 10.4(L) 10.3(L) 10.7(L)  HCT 39.0 - 52.0 % 33.3(L) 32.8(L) 33.7(L)  PLT 150 - 400 K/uL 427(H) 381 371  NEUTROABS 1.7 - 7.7 K/uL - - -  LYMPHSABS 0.7 - 4.0 K/uL - - -    Neurologic: Patient does not have protective sensation bilateral lower extremities.   MUSCULOSKELETAL:   Skin: Examination patient is a black eschar that completely encompasses the heel pad.  The ulcer is soft and probes down to bone.  There is foul-smelling odor and drainage.  Patient has a palpable dorsalis pedis pulse he does have some venous and lymphatic insufficiency of both lower extremities.  Review of the MRI scan shows a large soft tissue defect over the calcaneus with early osteomyelitis.  Patient's hemoglobin is stable at 10.4 white cell count 7.8.  Albumin 2.4 with a  hemoglobin A1c of 13.9.  Assessment: Assessment: Diabetic insensate neuropathy with a right transtibial amputation with osteomyelitis left calcaneus and abscess and gangrenous ulcer of the left heel.   Plan: I have recommended proceeding with a transtibial amputation on the left.  Discussed that we could proceed on Friday.  Patient states he has some concerns.  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  I will have a further discussion with the patient tomorrow morning.  Discussed that with the necrotic gangrenous heel ulcer, abscess, and early osteomyelitis, there are not any other reasonable options other than proceeding with surgery.  Thank you for the consult and the opportunity to see [name], [place] [phone] 8:03 AM

## 2020-10-25 NOTE — Progress Notes (Signed)
PROGRESS NOTE    Keith [name]  [id number] DOB: 1966-01-01 DOA: 10/17/2020 [name], NP   Brief Narrative:  Keith [name] is a 55 y.o. male with medical history significant for HIV, CKD 3B, type 2 diabetes, hypertension, hyperlipidemia, coronary artery disease, status post right below the knee amputation, who is sent to the ED by his orthopedic surgery provider due to concern for left heel ulcer that had failed outpatient therapy.  Associated with subjective fevers.  Patient was seen in the ED on 10/07/2020 and completed 7 days of oral doxycycline.  He followed up with orthopedic surgery and had another follow-up appointment earlier on the day of presentation where he was noted to have worsening left foot ulcer.  Patient was advised to come to the ED for IV antibiotics therapy.  Reports constant pain in his left foot.  Assessment & Plan:   Active Problems:   Elevated blood pressure reading with diagnosis of hypertension   Cellulitis   Ulcer of heel due to diabetes mellitus (HCC)   Renal insufficiency   Normochromic normocytic anemia   Cellulitis of left foot   Acute osteomyelitis of left calcaneus (HCC)   Left foot ulcer with concern for cellulitis, POA Failed outpatient therapy with 7-day course of doxycycline, started on 10/07/2020 with worsening symptoms Continue cefepime and IV Flagyl -Zyvox added 10/20/2020 due to MRSA history Patient sent in from orthopedic office (Dr Sharol Given) - appreciate insight and recs - currently recommending a transtibial amputation on the left - tentatively planned 10/27/20. Wound care following Follow cultures - negative thus far   Profoundly uncontrolled diabetes with hyperglycemia, improving A1C 14 - glucose more well controlled over the past 24h - continue to adjust to goal of 100-150 Lantus increased to 25u with improved results Continue increased sliding scale; follow hypoglycemia protocol  Diabetes polyneuropathy Resume home  regimen. Increase insulin - likely poor dietary compliance at home given profoundly elevated A1C   HIV Resume home HAART regimen Repeat labs for HAART regimen pending per pharmacy: HIV RNA Quant 130; Log10 HIV RNA 2.114  Chronic anxiety/depression Resume home regimen.  S/p R BKA with ambulatory dysfunction PT OT to follow   CKD 3B At baseline  DVT prophylaxis: Subcu Lovenox daily Code Status: Full code Family Communication: None at bedside Status is: inpt  Dispo: The patient is from: Home              Anticipated d/c is to: TBD              Anticipated d/c date is: >72h              Patient currently NOT medically stable for discharge  Consultants:  Orthopedic Surgery  Procedures:  TBD  Antimicrobials:  Cefepime/Metronidazole   Subjective: No acute issues/events overnight.  Requesting to speak to [name] again about further recommendations on holding off on surgery  Objective: Vitals:   10/24/20 0845 10/24/20 1635 10/24/20 2058 10/25/20 0652  BP: (!) 141/89 (!) 139/93 140/89 (!) 177/102  Pulse: 80 68 64 65  Resp: '18 20 18 18  '$ Temp: 97.8 F (36.6 C) 98.6 F (37 C) 98.3 F (36.8 C) 98.1 F (36.7 C)  TempSrc: Oral Oral Oral Oral  SpO2: 99% 98% 96% 100%  Weight:      Height:        Intake/Output Summary (Last 24 hours) at 10/25/2020 0819 Last data filed at 10/25/2020 0630 Gross per 24 hour  Intake 340 ml  Output  1700 ml  Net -1360 ml    Filed Weights   10/18/20 2122 10/20/20 2107 10/22/20 0525  Weight: 127 kg 128 kg 124.8 kg    Examination: General: 55 y.o. year-old male well developed well nourished in no acute distress.  Alert and oriented x3. Cardiovascular: Regular rate and rhythm with no rubs or gallops.  No thyromegaly or JVD noted.  No lower extremity edema. 2/4 pulses in all 4 extremities. Respiratory: Clear to auscultation with no wheezes or rales. Good inspiratory effort. Abdomen: Soft nontender nondistended with normal bowel sounds x4  quadrants. Muskuloskeletal: R BKA, left lower extremity 2+ pitting edema to knee with unstageable heel ulcer(see below) Neuro: CN II-XII intact, strength, sensation, reflexes Skin: LLE foot bandage clean/dry/intact  Psychiatry: Judgement and insight appear normal. Mood is appropriate for condition and setting  Data Reviewed: I have personally reviewed following labs and imaging studies  CBC: Recent Labs  Lab 10/21/20 0252 10/22/20 0320 10/23/20 0122 10/24/20 0457 10/25/20 0534  WBC 8.3 8.5 8.6 8.4 7.8  HGB 10.2* 10.3* 10.7* 10.3* 10.4*  HCT 32.9* 32.3* 33.7* 32.8* 33.3*  MCV 83.7 82.8 84.0 84.5 83.5  PLT 371 376 371 381 427*    Basic Metabolic Panel: Recent Labs  Lab 10/21/20 0252 10/22/20 0320 10/23/20 0122 10/24/20 0457 10/25/20 0534  NA 132* 132* 132* 132* 132*  K 4.4 4.7 4.5 4.8 4.4  CL 102 101 103 102 100  CO2 21* 25 21* 22 25  GLUCOSE 202* 249* 216* 222* 225*  BUN 24* 19 19 25* 24*  CREATININE 2.14* 1.97* 1.92* 2.14* 2.03*  CALCIUM 8.6* 8.8* 8.9 9.1 9.1  MG 1.8 1.8 1.8 1.8 1.8    GFR: Estimated Creatinine Clearance: 54.5 mL/min (A) (by C-G formula based on SCr of 2.03 mg/dL (H)). Liver Function Tests: No results for input(s): AST, ALT, ALKPHOS, BILITOT, PROT, ALBUMIN in the last 168 hours.  No results for input(s): LIPASE, AMYLASE in the last 168 hours. No results for input(s): AMMONIA in the last 168 hours. Coagulation Profile: No results for input(s): INR, PROTIME in the last 168 hours. Cardiac Enzymes: No results for input(s): CKTOTAL, CKMB, CKMBINDEX, TROPONINI in the last 168 hours. BNP (last 3 results) No results for input(s): PROBNP in the last 8760 hours. HbA1C: No results for input(s): HGBA1C in the last 72 hours. CBG: Recent Labs  Lab 10/24/20 0619 10/24/20 1135 10/24/20 1636 10/24/20 2059 10/25/20 0649  GLUCAP 228* 267* 241* 172* 227*    Lipid Profile: No results for input(s): CHOL, HDL, LDLCALC, TRIG, CHOLHDL, LDLDIRECT in the  last 72 hours. Thyroid Function Tests: No results for input(s): TSH, T4TOTAL, FREET4, T3FREE, THYROIDAB in the last 72 hours. Anemia Panel: No results for input(s): VITAMINB12, FOLATE, FERRITIN, TIBC, IRON, RETICCTPCT in the last 72 hours. Sepsis Labs: No results for input(s): PROCALCITON, LATICACIDVEN in the last 168 hours.   Recent Results (from the past 240 hour(s))  Blood culture (routine x 2)     Status: None   Collection Time: 10/17/20  6:30 PM   Specimen: BLOOD LEFT HAND  Result Value Ref Range Status   Specimen Description BLOOD LEFT HAND  Final   Special Requests   Final    BOTTLES DRAWN AEROBIC AND ANAEROBIC Blood Culture results may not be optimal due to an inadequate volume of blood received in culture bottles   Culture   Final    NO GROWTH 5 DAYS Performed at [facility], [place] [address], [place], [place] [address]  Report Status 10/22/2020 FINAL  Final  Blood culture (routine x 2)     Status: None   Collection Time: 10/18/20  4:39 AM   Specimen: BLOOD RIGHT WRIST  Result Value Ref Range Status   Specimen Description BLOOD RIGHT WRIST  Final   Special Requests   Final    BOTTLES DRAWN AEROBIC AND ANAEROBIC Blood Culture results may not be optimal due to an inadequate volume of blood received in culture bottles   Culture   Final    NO GROWTH 5 DAYS Performed at [facility], [place] [address] [place], [place] [address]    Report Status 10/23/2020 FINAL  Final  SARS CORONAVIRUS 2 (TAT 6-24 HRS) Nasopharyngeal Nasopharyngeal Swab     Status: None   Collection Time: 10/18/20  5:23 AM   Specimen: Nasopharyngeal Swab  Result Value Ref Range Status   SARS Coronavirus 2 NEGATIVE NEGATIVE Final    Comment: (NOTE) SARS-CoV-2 target nucleic acids are NOT DETECTED.  The SARS-CoV-2 RNA is generally detectable in upper and lower respiratory specimens during the acute phase of infection. Negative results do not preclude SARS-CoV-2 infection, do not rule  out co-infections with other pathogens, and should not be used as the sole basis for treatment or other patient management decisions. Negative results must be combined with clinical observations, patient history, and epidemiological information. The expected result is Negative.  Fact Sheet for Patients: SugarRoll.be  Fact Sheet for Healthcare Providers: https://www.woods-mathews.com/  This test is not yet approved or cleared by the [place] FDA and  has been authorized for detection and/or diagnosis of SARS-CoV-2 by FDA under an Emergency Use Authorization (EUA). This EUA will remain  in effect (meaning this test can be used) for the duration of the COVID-19 declaration under Se ction 564(b)(1) of the Act, 21 U.S.C. section 360bbb-3(b)(1), unless the authorization is terminated or revoked sooner.  Performed at [facility], [place] [address], [place], [place] [address]   MRSA Next Gen by PCR, Nasal     Status: None   Collection Time: 10/18/20  6:16 AM   Specimen: Nasal Mucosa; Nasal Swab  Result Value Ref Range Status   MRSA by PCR Next Gen NOT DETECTED NOT DETECTED Final    Comment: (NOTE) The GeneXpert MRSA Assay (FDA approved for NASAL specimens only), is one component of a comprehensive MRSA colonization surveillance program. It is not intended to diagnose MRSA infection nor to guide or monitor treatment for MRSA infections. Test performance is not FDA approved in patients less than 90 years old. Performed at [facility], [place] [address] [place], [place] [address]       Radiology Studies: No results found.  Scheduled Meds:  allopurinol  300 mg Oral Daily   amLODipine  10 mg Oral Daily   aspirin EC  81 mg Oral Daily   atorvastatin  80 mg Oral Daily   bictegravir-emtricitabine-tenofovir AF  1 tablet Oral Daily   carvedilol  25 mg Oral BID WC   dapsone  100 mg Oral Daily   enoxaparin (LOVENOX) injection  60 mg  Subcutaneous Q24H   escitalopram  20 mg Oral Daily   gabapentin  100 mg Oral TID   icosapent Ethyl  2 g Oral BID   insulin aspart  0-15 Units Subcutaneous TID WC   insulin aspart  0-5 Units Subcutaneous QHS   insulin glargine  25 Units Subcutaneous Daily   linezolid  600 mg Oral Q12H   metroNIDAZOLE  500 mg Oral Q12H   oxybutynin  2.5 mg Oral BID   ticagrelor  90 mg Oral BID   Continuous Infusions:  ceFEPime (MAXIPIME) IV 2 g (10/25/20 0550)     LOS: 7 days   Time spent: 15mn   Mccall Will C Keyle Doby, DO [facility]  If 7PM-7AM, please contact night-coverage [email or web address]  10/25/2020, 8:19 AM

## 2020-10-25 NOTE — Progress Notes (Signed)
Physical Therapy Treatment Patient Details Name: Keith [name] MRN: [id number] DOB: Mar 11, 1966 Today's Date: 10/25/2020    History of Present Illness Keith [name] is a 55 y.o. male  who is sent to the ED by his orthopedic surgery provider due to concern for left heel ulcer that had failed outpatient therapy. with medical history significant for HIV, CKD 3B, type 2 diabetes, hypertension, hyperlipidemia, coronary artery disease, status post right below the knee amputation,    PT Comments    Pt reports that rep from [employer] came and he now has socks to use with his prosthetic for a tighter fit. Pt agreeable to getting up and trying out the fit. Pt able to come to the EoB with mod I and independently don his shrinker. Pt with difficulty putting on sock over shrinker despite PT educating on need to roll sock up and not just pull on the sock. Educated on need for no wrinkles.Pt took increased education before he verbalized understanding. Pt is limited in safe mobility by L foot pain, decreased safety awareness, decreased command follow. Pt able to get up to standing in RW with min guard and seat his prosthetic. Pt with decreased weight shift and movement of L LE. Pt requires maximal cuing for sequencing and safety with walking boot to walk in room. D/c plans remain appropriate at this time.     Follow Up Recommendations  SNF     Equipment Recommendations  Wheelchair (measurements PT);Wheelchair cushion (measurements PT)       Precautions / Restrictions Precautions Precautions: Fall Precaution Comments: R BKA Required Braces or Orthoses: Other Brace Other Brace: LLE walking PRAFO Restrictions Weight Bearing Restrictions: Yes LLE Weight Bearing: Weight bearing as tolerated    Mobility  Bed Mobility Overal bed mobility: Modified Independent             General bed mobility comments: HOB elevated    Transfers Overall transfer level: Needs assistance Equipment used: Rolling  walker (2 wheeled) Transfers: Sit to/from Stand Sit to Stand: Min guard;From elevated surface         General transfer comment: min guard for power up and steadying in RW  Ambulation/Gait Ambulation/Gait assistance: Min Web designer (Feet): 16 Feet Assistive device: Rolling walker (2 wheeled) Gait Pattern/deviations: Step-to pattern;Decreased step length - left;Decreased weight shift to left;Decreased stance time - left;Antalgic Gait velocity: reduced Gait velocity interpretation: <1.8 ft/sec, indicate of risk for recurrent falls General Gait Details: light min A for safety with ambulation bed to door to recliner. Pt with very poor awareness of positioning of L CAM boot placing RW leg on top of the extended toe piece, cues for proximity to RW and upright posture         Balance Overall balance assessment: Needs assistance   Sitting balance-Leahy Scale: Good Sitting balance - Comments: Pt able to don shrinker     Standing balance-Leahy Scale: Poor Standing balance comment: reliant on bil UE support with standing                            Cognition Arousal/Alertness: Awake/alert Behavior During Therapy: WFL for tasks assessed/performed Overall Cognitive Status: Impaired/Different from baseline Area of Impairment: Problem solving;Safety/judgement;Following commands                       Following Commands: Follows multi-step commands with increased time;Follows one step commands consistently Safety/Judgement: Decreased awareness of safety   Problem  Solving: Slow processing;Requires verbal cues;Requires tactile cues General Comments: pt with poor command follow for proper donning of prosthetic sock, and with progressing his L LE in CAM boot      Exercises Amputee Exercises Chair Push Up: AROM;10 reps;Seated (x2)    General Comments General comments (skin integrity, edema, etc.): Pt able to don shrinker without assist, requires increased  cuing and assist for placing and pulling up sock without bunching to decrease risk of blistering on residual limb, VSS on RA      Pertinent Vitals/Pain Pain Assessment: Faces Faces Pain Scale: Hurts a little bit Pain Location: L foot/heel Pain Descriptors / Indicators: Aching Pain Intervention(s): Limited activity within patient's tolerance;Monitored during session;Repositioned     PT Goals (current goals can now be found in the care plan section) Acute Rehab PT Goals Patient Stated Goal: to ambulate without falling PT Goal Formulation: With patient Time For Goal Achievement: 11/01/20 Potential to Achieve Goals: Good Progress towards PT goals: Progressing toward goals    Frequency    Min 3X/week      PT Plan Current plan remains appropriate       AM-PAC PT "6 Clicks" Mobility   Outcome Measure  Help needed turning from your back to your side while in a flat bed without using bedrails?: None Help needed moving from lying on your back to sitting on the side of a flat bed without using bedrails?: None Help needed moving to and from a bed to a chair (including a wheelchair)?: A Little Help needed standing up from a chair using your arms (e.g., wheelchair or bedside chair)?: A Little Help needed to walk in hospital room?: A Lot Help needed climbing 3-5 steps with a railing? : A Lot 6 Click Score: 18    End of Session Equipment Utilized During Treatment: Gait belt Activity Tolerance: Patient tolerated treatment well Patient left: in chair;with call bell/phone within reach;with chair alarm set Nurse Communication: Mobility status PT Visit Diagnosis: Unsteadiness on feet (R26.81);Other abnormalities of gait and mobility (R26.89);Pain Pain - Right/Left: Left Pain - part of body: Ankle and joints of foot;Leg     Time: [id number] PT Time Calculation (min) (ACUTE ONLY): 19 min  Charges:  $Therapeutic Activity: 8-22 mins                     Keith [name] B. Migdalia Dk PT,  DPT Acute Rehabilitation Services Pager [phone] Office [phone]    [place] 10/25/2020, 1:11 PM

## 2020-10-25 NOTE — Consult Note (Signed)
ORTHOPAEDIC CONSULTATION  REQUESTING PHYSICIAN: Little Ishikawa, MD  Chief Complaint: Painful gangrenous ulcer left heel.  HPI: Keith [name] is a 55 y.o. male who presents with uncontrolled type 2 diabetes status post right transtibial amputation who presents with a chronic left heel decubitus ulcer with pain odor and drainage.  Past Medical History:  Diagnosis Date   Abscess of right foot    abscess/ulcer of R transtibial amputation requiring IV abx   AIDS ([place])    Anemia    CAD (coronary artery disease)    a. MI with stenting of OM1 in 11/2018 with residual disease. b. acute STEMI 03/2020 s/p DES to OM1   Chronic anemia    Chronic knee pain    right   Chronic pain    CKD (chronic kidney disease), stage IV (HCC)    CVA (cerebral vascular accident) ([place]) 04/2020   Diabetes type 2, uncontrolled ([place])    HgA1c 17.6 (04/27/2010)   Diabetic foot ulcer ([place]) 01/2017   right foot   Dilatation of aorta (HCC)    Erectile dysfunction    Genital warts    HIV (human immunodeficiency virus infection) ([place]) 2009   CD4 count 100, VL 13800 (05/01/2010)   Hyperlipidemia    Hypertension    Noncompliance with medication regimen    Osteomyelitis ([place])    h/o hand   Osteomyelitis of metatarsal ([place]) 04/28/2017   Past Surgical History:  Procedure Laterality Date   AMPUTATION Right 05/02/2017   Procedure: AMPUTATION TRANSMETARSAL;  Surgeon: [name], MD;  Location: [facility];  Service: Orthopedics;  Laterality: Right;   AMPUTATION Right 06/13/2017   Procedure: RIGHT BELOW KNEE AMPUTATION;  Surgeon: [name], MD;  Location: [facility];  Service: Orthopedics;  Laterality: Right;   BELOW KNEE LEG AMPUTATION Right 06/13/2017   BELOW KNEE LEG AMPUTATION     CORONARY STENT INTERVENTION N/A 11/10/2018   Procedure: CORONARY STENT INTERVENTION;  Surgeon: Leonie Man, MD;  Location: [facility];  Service: Cardiovascular;  Laterality: N/A;   CORONARY/GRAFT ACUTE MI  REVASCULARIZATION N/A 03/21/2020   Procedure: Coronary/Graft Acute MI Revascularization;  Surgeon: [name], MD;  Location: [facility];  Service: Cardiovascular;  Laterality: N/A;   HERNIA REPAIR     I & D EXTREMITY Left 08/21/2014   Procedure: INCISION AND DRAINAGE LEFT SMALL FINGER;  Surgeon: Leanora Cover, MD;  Location: [facility];  Service: Orthopedics;  Laterality: Left;   I & D EXTREMITY Right 03/18/2017   Procedure: IRRIGATION AND DEBRIDEMENT EXTREMITY;  Surgeon: [name], MD;  Location: [facility];  Service: Orthopedics;  Laterality: Right;   LEFT HEART CATH AND CORONARY ANGIOGRAPHY N/A 11/10/2018   Procedure: LEFT HEART CATH AND CORONARY ANGIOGRAPHY;  Surgeon: Leonie Man, MD;  Location: [facility];  Service: Cardiovascular;  Laterality: N/A;   LEFT HEART CATH AND CORONARY ANGIOGRAPHY N/A 03/21/2020   Procedure: LEFT HEART CATH AND CORONARY ANGIOGRAPHY;  Surgeon: [name], MD;  Location: [facility];  Service: Cardiovascular;  Laterality: N/A;   MINOR IRRIGATION AND DEBRIDEMENT OF WOUND Right 04/22/2014   Procedure: IRRIGATION AND DEBRIDEMENT OF RIGHT NECK ABCESS;  Surgeon: Jerrell Belfast, MD;  Location: [facility];  Service: ENT;  Laterality: Right;   MULTIPLE EXTRACTIONS WITH ALVEOLOPLASTY N/A 01/18/2013   Procedure: MULTIPLE EXTRACION 3, 6, 7, 10, 11, 13, 21, 22, 27, 28, 29, 30 WITH ALVEOLOPLASTY;  Surgeon: Gae Bon, DDS;  Location: [facility]  OR;  Service: Oral Surgery;  Laterality: N/A;   SKIN SPLIT GRAFT Right 03/21/2017   Procedure: IRRIGATION AND DEBRIDEMENT RIGHT FOOT AND APPLY SPLIT THICKNESS SKIN GRAFT AND WOUND VAC;  Surgeon: [name], MD;  Location: [facility];  Service: Orthopedics;  Laterality: Right;   TEE WITHOUT CARDIOVERSION N/A 05/02/2017   Procedure: TRANSESOPHAGEAL ECHOCARDIOGRAM (TEE);  Surgeon: Acie Fredrickson Wonda Cheng, MD;  Location: [facility] OR;  Service: Cardiovascular;  Laterality: N/A;  coincidental to orthopedic case   Social History    Socioeconomic History   Marital status: Single    Spouse name: Not on file   Number of children: 3   Years of education: 12   Highest education level: Not on file  Occupational History   Occupation: disabled  Tobacco Use   Smoking status: Never   Smokeless tobacco: Never  Vaping Use   Vaping Use: Never used  Substance and Sexual Activity   Alcohol use: Not Currently   Drug use: Not Currently    Comment: OTC ASA   Sexual activity: Yes    Partners: Female  Other Topics Concern   Not on file  Social History Narrative   ** Merged History Encounter **       Worked for the city of [place] for 18 years.   Unemployed.    Applying for disability.   Medicaid patient.   Social Determinants of Health   Financial Resource Strain: Low Risk    Difficulty of Paying Living Expenses: Not hard at all  Food Insecurity: No Food Insecurity   Worried About Charity fundraiser in the Last Year: Never true   Arboriculturist in the Last Year: Never true  Transportation Needs: Public librarian (Medical): No   Lack of Transportation (Non-Medical): Yes  Physical Activity: Not on file  Stress: Not on file  Social Connections: Not on file   Family History  Problem Relation Age of Onset   Hypertension Mother    Arthritis Father    Hypertension Father    Hypertension Brother    Cancer Maternal Grandmother 53       unknown type of cancer   Depression Paternal Grandmother    - negative except otherwise stated in the family history section Allergies  Allergen Reactions   Elemental Sulfur Itching    Patient stated he's allergic to "sulfur" AND "sulfa"   Sulfa Antibiotics Itching   Prior to Admission medications   Medication Sig Start Date End Date Taking? Authorizing Provider  acetaminophen (TYLENOL) 325 MG tablet Take 2 tablets (650 mg total) by mouth every 4 (four) hours as needed for mild pain, fever or headache. 05/03/20  Yes Debbe Odea, MD   allopurinol (ZYLOPRIM) 300 MG tablet Take 300 mg by mouth daily. 06/09/20  Yes [provider]  ALPRAZolam [name]) 1 MG tablet Take 1 mg by mouth daily as needed for anxiety. 08/15/20  Yes [provider]  amLODipine (NORVASC) 10 MG tablet Take 1 tablet (10 mg total) by mouth daily. 09/01/20 10/31/20 Yes [name], MD  aspirin 81 MG EC tablet Take 1 tablet (81 mg total) by mouth daily. 03/27/20  Yes [name], MD  atorvastatin (LIPITOR) 80 MG tablet Take 1 tablet (80 mg total) by mouth daily. 09/01/20 10/31/20 Yes [name], MD  bictegravir-emtricitabine-tenofovir AF (BIKTARVY) 50-200-25 MG TABS tablet Take 1 tablet by mouth daily. 05/19/20  Yes [name], MD  blood glucose meter kit and supplies Dispense  based on patient and insurance preference. Use up to four times daily as directed. (FOR ICD-10 E10.9, E11.9). 10/17/20  Yes [name], NP  carvedilol (COREG) 25 MG tablet Take 1 tablet (25 mg total) by mouth 2 (two) times daily with a meal. 09/01/20 10/31/20 Yes Kc, Ramesh, MD  dapsone 100 MG tablet TAKE 1 TABLET (100 MG TOTAL) BY MOUTH DAILY. Patient taking differently: Take 100 mg by mouth daily. 03/27/20 03/27/21 Yes [name], Ava, DO  escitalopram (LEXAPRO) 20 MG tablet Take 20 mg by mouth daily. 08/15/20  Yes [provider]  gabapentin (NEURONTIN) 100 MG capsule Take 1 capsule (100 mg total) by mouth 3 (three) times daily. 09/01/20 10/31/20 Yes [name], MD  glucose blood (COOL BLOOD GLUCOSE TEST STRIPS) test strip Use as instructed 02/16/20  Yes [name], FNP  icosapent Ethyl (VASCEPA) 1 g capsule TAKE 2 CAPSULES (2 G TOTAL) BY MOUTH TWO TIMES DAILY. Patient taking differently: Take 2 g by mouth 2 (two) times daily. 03/27/20 03/27/21 Yes [name], MD  insulin aspart protamine- aspart (NOVOLOG MIX 70/30) (70-30) 100 UNIT/ML injection Inject 0.3 mLs (30 Units total) into the skin 2 (two) times daily with a meal. With breakfast and  dinner. Patient taking differently: No sig reported 10/01/20 12/30/20 Yes Enzo Bi, MD  lisinopril (ZESTRIL) 5 MG tablet Take 5 mg by mouth daily.   Yes [provider]  metFORMIN (GLUCOPHAGE) 1000 MG tablet Take 1,000 mg by mouth 2 (two) times daily. 09/05/20  Yes [provider]  nitroGLYCERIN (NITROSTAT) 0.4 MG SL tablet PLACE 1 TABLET (0.4 MG TOTAL) UNDER THE TONGUE EVERY FIVE MINUTES X 3 DOSES AS NEEDED FOR CHEST PAIN. Patient taking differently: Place 0.4 mg under the tongue every 5 (five) minutes x 3 doses as needed for chest pain. 03/27/20 03/27/21 Yes [name], Ava, DO  oxybutynin (DITROPAN) 5 MG tablet TAKE 1 TABLET (5 MG TOTAL) BY MOUTH TWO TIMES DAILY. Patient taking differently: Take 5 mg by mouth 2 (two) times daily. 03/27/20 03/27/21 Yes [name], MD  oxyCODONE 10 MG TABS Take 1 tablet (10 mg total) by mouth every 6 (six) hours as needed for severe pain (Severe pain.). 05/03/20  Yes Debbe Odea, MD  ticagrelor (BRILINTA) 90 MG TABS tablet Take 90 mg by mouth 2 (two) times daily.   Yes [provider]  traZODone (DESYREL) 100 MG tablet Take 100 mg by mouth at bedtime. 09/11/20  Yes [provider]  doxycycline (VIBRAMYCIN) 100 MG capsule Take 1 capsule (100 mg total) by mouth 2 (two) times daily. One po bid x 7 days Patient not taking: Reported on 10/18/2020 10/07/20   Sherwood Gambler, MD  Lancets [facility] ULTRASOFT) lancets Use as instructed 02/16/20   [name], FNP   No results found. - pertinent xrays, CT, MRI studies were reviewed and independently interpreted  Positive ROS: All other systems have been reviewed and were otherwise negative with the exception of those mentioned in the HPI and as above.  Physical Exam: General: Alert, no acute distress Psychiatric: Patient is competent for consent with normal mood and affect Lymphatic: No axillary or cervical lymphadenopathy Cardiovascular: No pedal edema Respiratory: No cyanosis,  no use of accessory musculature GI: No organomegaly, abdomen is soft and non-tender    Images:  _0 @  Labs:  Lab Results  Component Value Date   HGBA1C 13.9 (H) 08/29/2020   HGBA1C 10.6 (H) 05/19/2020   HGBA1C >15.5 (H) 03/24/2020   ESRSEDRATE 128 (  H) 10/18/2020   ESRSEDRATE 85 (H) 10/07/2020   ESRSEDRATE 130 (H) 11/02/2018   CRP 11.2 (H) 10/18/2020   CRP 6.7 (H) 10/07/2020   CRP <0.8 11/02/2018   LABURIC 6.3 08/07/2009   REPTSTATUS 10/23/2020 FINAL 10/18/2020   GRAMSTAIN  04/29/2017    NO WBC SEEN RARE GRAM POSITIVE COCCI IN PAIRS IN SINGLES    CULT  10/18/2020    NO GROWTH 5 DAYS Performed at [facility], [place] [address] [place], [place] [address]    LABORGA METHICILLIN RESISTANT STAPHYLOCOCCUS AUREUS 04/29/2017    Lab Results  Component Value Date   ALBUMIN 2.4 (L) 10/17/2020   ALBUMIN 3.1 (L) 10/07/2020   ALBUMIN 3.1 (L) 09/29/2020   PREALBUMIN 25.5 06/06/2018   PREALBUMIN 7.4 (L) 03/15/2017   LABURIC 6.3 08/07/2009     CBC EXTENDED Latest Ref Rng & Units 10/25/2020 10/24/2020 10/23/2020  WBC 4.0 - 10.5 K/uL 7.8 8.4 8.6  RBC 4.22 - 5.81 MIL/uL 3.99(L) 3.88(L) 4.01(L)  HGB 13.0 - 17.0 g/dL 10.4(L) 10.3(L) 10.7(L)  HCT 39.0 - 52.0 % 33.3(L) 32.8(L) 33.7(L)  PLT 150 - 400 K/uL 427(H) 381 371  NEUTROABS 1.7 - 7.7 K/uL - - -  LYMPHSABS 0.7 - 4.0 K/uL - - -    Neurologic: Patient does not have protective sensation bilateral lower extremities.   MUSCULOSKELETAL:   Skin: Examination patient is a black eschar that completely encompasses the heel pad.  The ulcer is soft and probes down to bone.  There is foul-smelling odor and drainage.  Patient has a palpable dorsalis pedis pulse he does have some venous and lymphatic insufficiency of both lower extremities.  Review of the MRI scan shows a large soft tissue defect over the calcaneus with early osteomyelitis.  Patient's hemoglobin is stable at 10.4 white cell count 7.8.  Albumin 2.4 with a  hemoglobin A1c of 13.9.  Assessment: Assessment: Diabetic insensate neuropathy with a right transtibial amputation with osteomyelitis left calcaneus and abscess and gangrenous ulcer of the left heel.   Plan: I have recommended proceeding with a transtibial amputation on the left.  Discussed that we could proceed on Friday.  Patient states he has some concerns.  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  I will have a further discussion with the patient tomorrow morning.  Discussed that with the necrotic gangrenous heel ulcer, abscess, and early osteomyelitis, there are not any other reasonable options other than proceeding with surgery.  Thank you for the consult and the opportunity to see Keith [name], [place] [phone] 8:03 AM

## 2020-10-26 ENCOUNTER — Other Ambulatory Visit: Payer: Self-pay | Admitting: Physician Assistant

## 2020-10-26 DIAGNOSIS — L97424 Non-pressure chronic ulcer of left heel and midfoot with necrosis of bone: Secondary | ICD-10-CM | POA: Diagnosis not present

## 2020-10-26 DIAGNOSIS — M86172 Other acute osteomyelitis, left ankle and foot: Secondary | ICD-10-CM | POA: Diagnosis not present

## 2020-10-26 DIAGNOSIS — L03116 Cellulitis of left lower limb: Secondary | ICD-10-CM | POA: Diagnosis not present

## 2020-10-26 DIAGNOSIS — E11621 Type 2 diabetes mellitus with foot ulcer: Secondary | ICD-10-CM | POA: Diagnosis not present

## 2020-10-26 LAB — GLUCOSE, CAPILLARY
Glucose-Capillary: 120 mg/dL — ABNORMAL HIGH (ref 70–99)
Glucose-Capillary: 130 mg/dL — ABNORMAL HIGH (ref 70–99)
Glucose-Capillary: 157 mg/dL — ABNORMAL HIGH (ref 70–99)
Glucose-Capillary: 173 mg/dL — ABNORMAL HIGH (ref 70–99)

## 2020-10-26 NOTE — Progress Notes (Signed)
[name], male   DOB: 03-Apr-1966, 55 y.o.   MRN: [id number] Patient is seen in follow-up for abscess and osteomyelitis left heel.  Reviewed surgical options.  All questions were encouraged and answered.  Patient states he understands and wishes to proceed with a left transtibial amputation tomorrow.  Anticipate patient will need short-term skilled nursing placement postoperatively.

## 2020-10-26 NOTE — Plan of Care (Signed)
  Problem: Clinical Measurements: Goal: Ability to avoid or minimize complications of infection will improve Outcome: Progressing   Problem: Education: Goal: Knowledge of General Education information will improve Description: Including pain rating scale, medication(s)/side effects and non-pharmacologic comfort measures Outcome: Progressing   Problem: Activity: Goal: Risk for activity intolerance will decrease Outcome: Progressing   Problem: Clinical Measurements: Goal: Ability to avoid or minimize complications of infection will improve Outcome: Progressing   Problem: Education: Goal: Knowledge of General Education information will improve Description: Including pain rating scale, medication(s)/side effects and non-pharmacologic comfort measures Outcome: Progressing   Problem: Activity: Goal: Risk for activity intolerance will decrease Outcome: Progressing

## 2020-10-26 NOTE — Progress Notes (Signed)
PROGRESS NOTE    Keith [name]  [id number] DOB: 1965/08/27 DOA: 10/17/2020 [name], NP   Brief Narrative:  Keith [name] is a 55 y.o. male with medical history significant for HIV, CKD 3B, type 2 diabetes, hypertension, hyperlipidemia, coronary artery disease, status post right below the knee amputation, who is sent to the ED by his orthopedic surgery provider due to concern for left heel ulcer that had failed outpatient therapy.  Associated with subjective fevers.  Patient was seen in the ED on 10/07/2020 and completed 7 days of oral doxycycline.  He followed up with orthopedic surgery and had another follow-up appointment earlier on the day of presentation where he was noted to have worsening left foot ulcer.  Patient was advised to come to the ED for IV antibiotics therapy.  Reports constant pain in his left foot.  Assessment & Plan:   Active Problems:   Elevated blood pressure reading with diagnosis of hypertension   Cellulitis   Ulcer of heel due to diabetes mellitus (HCC)   Renal insufficiency   Normochromic normocytic anemia   Cellulitis of left foot   Acute osteomyelitis of left calcaneus (HCC)   Left foot ulcer with concern for cellulitis, POA Failed outpatient therapy with 7-day course of doxycycline, started on 10/07/2020 with worsening symptoms Continue cefepime and IV Flagyl -Zyvox added 10/20/2020 due to MRSA history Patient sent in from orthopedic office (Dr Sharol Given) - appreciate insight and recs - currently recommending a transtibial amputation on the left - planned 10/27/20.   Profoundly uncontrolled diabetes with hyperglycemia, improving With notable vascular compromise and neuropathy A1C 14 - glucose more well controlled over the past 24h - continue to adjust to goal of 100-150 Lantus increased to 35u with improved results - will hold Continue increased sliding scale; follow hypoglycemia protocol  Diabetetic neuropathy Resume home regimen. Increase  insulin - likely poor dietary compliance at home given profoundly elevated A1C   HIV Resume home HAART regimen Repeat labs for HAART regimen pending per pharmacy: HIV RNA Quant 130; Log10 HIV RNA 2.114  Chronic anxiety/depression Continue home meds  Acute ambulatory dysfunction 2/2 above Chronic ambulatory dysfunction s/p R BKA PT OT to follow Placement pending post surgical ambulation status   CKD 3B At baseline  DVT prophylaxis: Subcu Lovenox daily Code Status: Full code Family Communication: None at bedside Status is: inpt  Dispo: The patient is from: Home              Anticipated d/c is to: TBD - likely SNF pending ambulation status              Anticipated d/c date is: >72h              Patient currently NOT medically stable for discharge  Consultants:  Orthopedic Surgery  Procedures:  TBD  Antimicrobials:  Cefepime/Metronidazole/Linezolid  Subjective: No acute issues/events overnight.  Requesting to speak to [name] again about further recommendations on holding off on surgery  Objective: Vitals:   10/25/20 0821 10/25/20 1752 10/25/20 2140 10/26/20 0448  BP: (!) 138/93 (!) 151/118 (!) 130/94 (!) 133/100  Pulse: 69 87 65 69  Resp: '18 18 18 16  '$ Temp: 98.3 F (36.8 C)  98 F (36.7 C) 98 F (36.7 C)  TempSrc:    Oral  SpO2: 100% 100% 99% 99%  Weight:      Height:        Intake/Output Summary (Last 24 hours) at 10/26/2020 [id number] Last data filed  at 10/26/2020 0445 Gross per 24 hour  Intake 1325 ml  Output 1800 ml  Net -475 ml    Filed Weights   10/18/20 2122 10/20/20 2107 10/22/20 0525  Weight: 127 kg 128 kg 124.8 kg    Examination: General: 55 y.o. year-old male well developed well nourished in no acute distress.  Alert and oriented x3. Cardiovascular: Regular rate and rhythm with no rubs or gallops.  No thyromegaly or JVD noted.  No lower extremity edema. 2/4 pulses in all 4 extremities. Respiratory: Clear to auscultation with no wheezes or rales.  Good inspiratory effort. Abdomen: Soft nontender nondistended with normal bowel sounds x4 quadrants. Muskuloskeletal: R BKA, left lower extremity 2+ pitting edema to knee with unstageable heel ulcer(see below) Neuro: CN II-XII intact, strength, sensation, reflexes Skin: LLE foot bandage clean/dry/intact  Psychiatry: Judgement and insight appear normal. Mood is appropriate for condition and setting  Data Reviewed: I have personally reviewed following labs and imaging studies  CBC: Recent Labs  Lab 10/21/20 0252 10/22/20 0320 10/23/20 0122 10/24/20 0457 10/25/20 0534  WBC 8.3 8.5 8.6 8.4 7.8  HGB 10.2* 10.3* 10.7* 10.3* 10.4*  HCT 32.9* 32.3* 33.7* 32.8* 33.3*  MCV 83.7 82.8 84.0 84.5 83.5  PLT 371 376 371 381 427*    Basic Metabolic Panel: Recent Labs  Lab 10/21/20 0252 10/22/20 0320 10/23/20 0122 10/24/20 0457 10/25/20 0534  NA 132* 132* 132* 132* 132*  K 4.4 4.7 4.5 4.8 4.4  CL 102 101 103 102 100  CO2 21* 25 21* 22 25  GLUCOSE 202* 249* 216* 222* 225*  BUN 24* 19 19 25* 24*  CREATININE 2.14* 1.97* 1.92* 2.14* 2.03*  CALCIUM 8.6* 8.8* 8.9 9.1 9.1  MG 1.8 1.8 1.8 1.8 1.8    GFR: Estimated Creatinine Clearance: 54.5 mL/min (A) (by C-G formula based on SCr of 2.03 mg/dL (H)). Liver Function Tests: No results for input(s): AST, ALT, ALKPHOS, BILITOT, PROT, ALBUMIN in the last 168 hours.  No results for input(s): LIPASE, AMYLASE in the last 168 hours. No results for input(s): AMMONIA in the last 168 hours. Coagulation Profile: No results for input(s): INR, PROTIME in the last 168 hours. Cardiac Enzymes: No results for input(s): CKTOTAL, CKMB, CKMBINDEX, TROPONINI in the last 168 hours. BNP (last 3 results) No results for input(s): PROBNP in the last 8760 hours. HbA1C: No results for input(s): HGBA1C in the last 72 hours. CBG: Recent Labs  Lab 10/25/20 0649 10/25/20 1202 10/25/20 1643 10/25/20 2140 10/26/20 0635  GLUCAP 227* 178* 235* 204* 120*    Lipid  Profile: No results for input(s): CHOL, HDL, LDLCALC, TRIG, CHOLHDL, LDLDIRECT in the last 72 hours. Thyroid Function Tests: No results for input(s): TSH, T4TOTAL, FREET4, T3FREE, THYROIDAB in the last 72 hours. Anemia Panel: No results for input(s): VITAMINB12, FOLATE, FERRITIN, TIBC, IRON, RETICCTPCT in the last 72 hours. Sepsis Labs: No results for input(s): PROCALCITON, LATICACIDVEN in the last 168 hours.   Recent Results (from the past 240 hour(s))  Blood culture (routine x 2)     Status: None   Collection Time: 10/17/20  6:30 PM   Specimen: BLOOD LEFT HAND  Result Value Ref Range Status   Specimen Description BLOOD LEFT HAND  Final   Special Requests   Final    BOTTLES DRAWN AEROBIC AND ANAEROBIC Blood Culture results may not be optimal due to an inadequate volume of blood received in culture bottles   Culture   Final    NO GROWTH 5 DAYS Performed  at [facility], [place] [address], [place], [place] [address]    Report Status 10/22/2020 FINAL  Final  Blood culture (routine x 2)     Status: None   Collection Time: 10/18/20  4:39 AM   Specimen: BLOOD RIGHT WRIST  Result Value Ref Range Status   Specimen Description BLOOD RIGHT WRIST  Final   Special Requests   Final    BOTTLES DRAWN AEROBIC AND ANAEROBIC Blood Culture results may not be optimal due to an inadequate volume of blood received in culture bottles   Culture   Final    NO GROWTH 5 DAYS Performed at [facility], [place] [address], [place], [place] [address]    Report Status 10/23/2020 FINAL  Final  SARS CORONAVIRUS 2 (TAT 6-24 HRS) Nasopharyngeal Nasopharyngeal Swab     Status: None   Collection Time: 10/18/20  5:23 AM   Specimen: Nasopharyngeal Swab  Result Value Ref Range Status   SARS Coronavirus 2 NEGATIVE NEGATIVE Final    Comment: (NOTE) SARS-CoV-2 target nucleic acids are NOT DETECTED.  The SARS-CoV-2 RNA is generally detectable in upper and lower respiratory specimens during the acute phase of  infection. Negative results do not preclude SARS-CoV-2 infection, do not rule out co-infections with other pathogens, and should not be used as the sole basis for treatment or other patient management decisions. Negative results must be combined with clinical observations, patient history, and epidemiological information. The expected result is Negative.  Fact Sheet for Patients: SugarRoll.be  Fact Sheet for Healthcare Providers: https://www.woods-mathews.com/  This test is not yet approved or cleared by the [place] FDA and  has been authorized for detection and/or diagnosis of SARS-CoV-2 by FDA under an Emergency Use Authorization (EUA). This EUA will remain  in effect (meaning this test can be used) for the duration of the COVID-19 declaration under Se ction 564(b)(1) of the Act, 21 U.S.C. section 360bbb-3(b)(1), unless the authorization is terminated or revoked sooner.  Performed at [facility], [place] [address], [place], [place] [address]   MRSA Next Gen by PCR, Nasal     Status: None   Collection Time: 10/18/20  6:16 AM   Specimen: Nasal Mucosa; Nasal Swab  Result Value Ref Range Status   MRSA by PCR Next Gen NOT DETECTED NOT DETECTED Final    Comment: (NOTE) The GeneXpert MRSA Assay (FDA approved for NASAL specimens only), is one component of a comprehensive MRSA colonization surveillance program. It is not intended to diagnose MRSA infection nor to guide or monitor treatment for MRSA infections. Test performance is not FDA approved in patients less than 54 years old. Performed at [facility], [place] [address] [place], [place] [address]       Radiology Studies: No results found.  Scheduled Meds:  allopurinol  300 mg Oral Daily   amLODipine  10 mg Oral Daily   aspirin EC  81 mg Oral Daily   atorvastatin  80 mg Oral Daily   bictegravir-emtricitabine-tenofovir AF  1 tablet Oral Daily   carvedilol  25 mg  Oral BID WC   dapsone  100 mg Oral Daily   enoxaparin (LOVENOX) injection  60 mg Subcutaneous Q24H   escitalopram  20 mg Oral Daily   gabapentin  100 mg Oral TID   icosapent Ethyl  2 g Oral BID   insulin aspart  0-15 Units Subcutaneous TID WC   insulin aspart  0-5 Units Subcutaneous QHS   insulin glargine  35 Units Subcutaneous Daily   linezolid  600 mg Oral Q12H   metroNIDAZOLE  500 mg Oral Q12H   oxybutynin  2.5 mg Oral BID   ticagrelor  90 mg Oral BID   Continuous Infusions:  ceFEPime (MAXIPIME) IV 2 g (10/26/20 0445)     LOS: 8 days   Time spent: 16mn  Keith Ocain C Shaney Deckman, DO [facility]  If 7PM-7AM, please contact night-coverage [email or web address]  10/26/2020, 8:17 AM

## 2020-10-26 NOTE — TOC Progression Note (Signed)
Transition of Care [facility]) - Progression Note    Patient Details  Name: Keith [name] MRN: [name] Date of Birth: [birth year]  Transition of Care [facility]) CM/SW [place], [place] Phone Number: 10/26/2020, 3:44 PM  Clinical Narrative:     [facility] SNF facilities that are still pending in hub.   [facility] cannot take medicaid currently [name] is reviewing; waiting on response.  No response from [place] No response from [employer]  Expected Discharge Plan: [place] Barriers to Discharge: SNF Pending bed offer  Expected Discharge Plan and Services Expected Discharge Plan: [place] In-house Referral: Clinical Social Work Discharge Planning Services: CM Consult Post Acute Care Choice: [place] Living arrangements for the past 2 months: Single Family Home                                       Social Determinants of Health (SDOH) Interventions    Readmission Risk Interventions Readmission Risk Prevention Plan 10/21/2020 11/13/2018 11/13/2018  Transportation Screening Complete - Complete  PCP or Specialist Appt within 3-5 Days - - -  [place] or [place] Work Consult for [place] Planning/Counseling - - -  [place] - - -  Medication Review Press photographer) Complete - Complete  PCP or Specialist appointment within 3-5 days of discharge Complete Complete -  [place] or Home Care Consult Complete Complete -  SW Recovery Care/Counseling Consult Complete Complete -  Palliative Care Screening Not Applicable Not Applicable -  [place] Complete Not Applicable -  Some encounter information is confidential and restricted. Go to Review Flowsheets activity to see all data.  Some recent data might be hidden

## 2020-10-26 NOTE — Progress Notes (Signed)
Physical Therapy Treatment Patient Details Name: Keith [name] MRN: [name] DOB: [birth year] Today's Date: 10/26/2020    History of Present Illness Keith [name] is a 55 y.o. male  who is sent to the ED by his orthopedic surgery provider due to concern for left heel ulcer that had failed outpatient therapy. with medical history significant for HIV, CKD 3B, type 2 diabetes, hypertension, hyperlipidemia, coronary artery disease, status post right below the knee amputation,    PT Comments    Pt in bed in dark, reports feeling frustrated that he will need L transtibial amputation tomorrow. BP improved since this morning (see General Comments) Pt with poor carryover of instructions for use of prosthetic sock, remembers with cuing. Pt agreeable to getting up and going for short walk with therapy. Pt is mod I for bed mobility, min guard for transfers and short distance ambulation with prosthetic.  Once up in chair family member called. D/c plan remains appropriate especially after surgery tomorrow. PT will follow back for Re-Eval afterwards.     Follow Up Recommendations  SNF     Equipment Recommendations  Wheelchair (measurements PT);Wheelchair cushion (measurements PT)       Precautions / Restrictions Precautions Precautions: Fall Precaution Comments: R BKA Required Braces or Orthoses: Other Brace Other Brace: LLE walking PRAFO Restrictions Weight Bearing Restrictions: Yes LLE Weight Bearing: Weight bearing as tolerated    Mobility  Bed Mobility Overal bed mobility: Modified Independent             General bed mobility comments: HOB elevated    Transfers Overall transfer level: Needs assistance Equipment used: Rolling walker (2 wheeled) Transfers: Sit to/from Stand Sit to Stand: Min guard;From elevated surface         General transfer comment: min guard for power up and steadying in RW  Ambulation/Gait Ambulation/Gait assistance: Min guard Gait Distance (Feet):  16 Feet Assistive device: Rolling walker (2 wheeled) Gait Pattern/deviations: Step-to pattern;Decreased step length - left;Decreased weight shift to left;Decreased stance time - left;Antalgic Gait velocity: reduced Gait velocity interpretation: <1.8 ft/sec, indicate of risk for recurrent falls General Gait Details: min guard for safety, much better awareness and safety with ambulation       Balance Overall balance assessment: Needs assistance   Sitting balance-Leahy Scale: Good Sitting balance - Comments: Pt able to don shrinker     Standing balance-Leahy Scale: Poor Standing balance comment: reliant on bil UE support with standing                            Cognition Arousal/Alertness: Awake/alert Behavior During Therapy: WFL for tasks assessed/performed Overall Cognitive Status: Impaired/Different from baseline Area of Impairment: Problem solving;Safety/judgement;Following commands                       Following Commands: Follows multi-step commands with increased time;Follows one step commands consistently Safety/Judgement: Decreased awareness of safety   Problem Solving: Slow processing;Requires verbal cues;Requires tactile cues General Comments: continues to have poor understanding of his situation, but does report feeling frustrated that it has come to amputation         General Comments General comments (skin integrity, edema, etc.): Pt with elevated BP earlier in day. BP prior to ambulation 148/96 after ambulation 151/98 Pt with poor carryover of training for donning prosthetic sock, with reminders is able to don correctly.      Pertinent Vitals/Pain Pain Assessment: Faces Faces Pain Scale:  Hurts little more Pain Location: L foot/heel Pain Descriptors / Indicators: Aching Pain Intervention(s): Limited activity within patient's tolerance;Monitored during session;Repositioned           PT Goals (current goals can now be found in the care  plan section) Acute Rehab PT Goals Patient Stated Goal: to ambulate without falling PT Goal Formulation: With patient Time For Goal Achievement: 11/01/20 Potential to Achieve Goals: Good Progress towards PT goals: Progressing toward goals    Frequency    Min 3X/week      PT Plan Current plan remains appropriate       AM-PAC PT "6 Clicks" Mobility   Outcome Measure  Help needed turning from your back to your side while in a flat bed without using bedrails?: None Help needed moving from lying on your back to sitting on the side of a flat bed without using bedrails?: None Help needed moving to and from a bed to a chair (including a wheelchair)?: A Little Help needed standing up from a chair using your arms (e.g., wheelchair or bedside chair)?: A Little Help needed to walk in hospital room?: A Lot Help needed climbing 3-5 steps with a railing? : A Lot 6 Click Score: 18    End of Session Equipment Utilized During Treatment: Gait belt Activity Tolerance: Patient tolerated treatment well Patient left: in chair;with call bell/phone within reach;with chair alarm set Nurse Communication: Mobility status PT Visit Diagnosis: Unsteadiness on feet (R26.81);Other abnormalities of gait and mobility (R26.89);Pain Pain - Right/Left: Left Pain - part of body: Ankle and joints of foot;Leg     Time: 1528-1550 PT Time Calculation (min) (ACUTE ONLY): 22 min  Charges:  $Therapeutic Exercise: 8-22 mins                     Amery Minasyan B. Migdalia Dk PT, DPT Acute Rehabilitation Services Pager [phone] Office [phone]    [place] 10/26/2020, 4:02 PM

## 2020-10-26 NOTE — Progress Notes (Signed)
Pharmacy Antibiotic Note  Keith [name] is a 55 y.o. male admitted on 10/17/2020 with  wound infection .  Pt failed doxycycline outpatient.  Pharmacy consulted for cefepime dosing.  Wbc remains wnl. AF. MRI suggested early osteo.  Plan for BKA 7/22.  Plan: Continue cefepime 2g IV Q12H Linezolid '600mg'$  PO BID Flagyl 500 mg PO Q12H Monitor clinical progress, cultures/sensitivities, renal function, abx plan   Height: '5\' 10"'$  (177.8 cm) Weight: 124.8 kg (275 lb 2.2 oz) IBW/kg (Calculated) : 73  Temp (24hrs), Avg:98 F (36.7 C), Min:98 F (36.7 C), Max:98.1 F (36.7 C)  Recent Labs  Lab 10/21/20 0252 10/22/20 0320 10/23/20 0122 10/24/20 0457 10/25/20 0534  WBC 8.3 8.5 8.6 8.4 7.8  CREATININE 2.14* 1.97* 1.92* 2.14* 2.03*     Estimated Creatinine Clearance: 54.5 mL/min (A) (by C-G formula based on SCr of 2.03 mg/dL (H)).    Allergies  Allergen Reactions   Elemental Sulfur Itching    Patient stated he's allergic to "sulfur" AND "sulfa"   Sulfa Antibiotics Itching   7/2 doxy x 7 days 7/13 vanc x 1 7/13 cefepime>> 7/13 Flagyl>> 7/15 zyvox>>   7/13 BCx: NGF   Thank you for allowing [place] to participate in this patients care. Jens Som, PharmD 10/26/2020 1:22 PM  Please check [email or web address] for unit-specific pharmacy phone numbers.

## 2020-10-26 NOTE — Progress Notes (Signed)
OT Cancellation Note  Patient Details Name: Keith [name] MRN: [name] DOB: 1965-06-15   Cancelled Treatment:    Reason Eval/Treat Not Completed: Other (comment). Pt received in bed, NT assessing vitals. Pt with BP reading of 181/107, rechecked for validity and still in this range. NT notified RN. Will hold OT for now and reattempt at a later time once BP has normalized.   Tyrone Schimke, OT Acute Rehabilitation Services Pager: [phone] Office: [phone]  10/26/2020, 9:31 AM

## 2020-10-27 ENCOUNTER — Encounter (HOSPITAL_COMMUNITY)
Admission: EM | Disposition: A | Discharge status: Home Health | Payer: Self-pay | Source: Home / Self Care | Attending: Internal Medicine

## 2020-10-27 ENCOUNTER — Encounter (HOSPITAL_COMMUNITY): Payer: Self-pay | Admitting: Internal Medicine

## 2020-10-27 ENCOUNTER — Inpatient Hospital Stay (HOSPITAL_COMMUNITY): Payer: Medicare HMO

## 2020-10-27 DIAGNOSIS — L97424 Non-pressure chronic ulcer of left heel and midfoot with necrosis of bone: Secondary | ICD-10-CM | POA: Diagnosis not present

## 2020-10-27 DIAGNOSIS — M86172 Other acute osteomyelitis, left ankle and foot: Secondary | ICD-10-CM | POA: Diagnosis not present

## 2020-10-27 DIAGNOSIS — L97429 Non-pressure chronic ulcer of left heel and midfoot with unspecified severity: Secondary | ICD-10-CM

## 2020-10-27 DIAGNOSIS — Z794 Long term (current) use of insulin: Secondary | ICD-10-CM

## 2020-10-27 DIAGNOSIS — L03116 Cellulitis of left lower limb: Secondary | ICD-10-CM | POA: Diagnosis not present

## 2020-10-27 DIAGNOSIS — E13621 Other specified diabetes mellitus with foot ulcer: Secondary | ICD-10-CM

## 2020-10-27 DIAGNOSIS — E11621 Type 2 diabetes mellitus with foot ulcer: Secondary | ICD-10-CM | POA: Diagnosis not present

## 2020-10-27 HISTORY — PX: APPLICATION OF WOUND VAC: SHX5189

## 2020-10-27 HISTORY — PX: AMPUTATION: SHX166

## 2020-10-27 LAB — BASIC METABOLIC PANEL
Anion gap: 9 (ref 5–15)
BUN: 30 mg/dL — ABNORMAL HIGH (ref 6–20)
CO2: 22 mmol/L (ref 22–32)
Calcium: 9.6 mg/dL (ref 8.9–10.3)
Chloride: 102 mmol/L (ref 98–111)
Creatinine, Ser: 2.14 mg/dL — ABNORMAL HIGH (ref 0.61–1.24)
GFR, Estimated: 36 mL/min — ABNORMAL LOW (ref >60)
Glucose, Bld: 163 mg/dL — ABNORMAL HIGH (ref 70–99)
Potassium: 4.8 mmol/L (ref 3.5–5.1)
Sodium: 133 mmol/L — ABNORMAL LOW (ref 135–145)

## 2020-10-27 LAB — SURGICAL PCR SCREEN
MRSA, PCR: NEGATIVE
Staphylococcus aureus: NEGATIVE

## 2020-10-27 LAB — GLUCOSE, CAPILLARY
Glucose-Capillary: 155 mg/dL — ABNORMAL HIGH (ref 70–99)
Glucose-Capillary: 93 mg/dL (ref 70–99)
Glucose-Capillary: 101 mg/dL — ABNORMAL HIGH (ref 70–99)
Glucose-Capillary: 86 mg/dL (ref 70–99)
Glucose-Capillary: 98 mg/dL (ref 70–99)
Glucose-Capillary: 396 mg/dL — ABNORMAL HIGH (ref 70–99)

## 2020-10-27 LAB — TYPE AND SCREEN
ABO/RH(D): O POS
Antibody Screen: NEGATIVE

## 2020-10-27 LAB — CBC
HCT: 32.8 % — ABNORMAL LOW (ref 39.0–52.0)
Hemoglobin: 10.3 g/dL — ABNORMAL LOW (ref 13.0–17.0)
MCH: 26.4 pg (ref 26.0–34.0)
MCHC: 31.4 g/dL (ref 30.0–36.0)
MCV: 84.1 fL (ref 80.0–100.0)
Platelets: 381 10*3/uL (ref 150–400)
RBC: 3.9 MIL/uL — ABNORMAL LOW (ref 4.22–5.81)
RDW: 13.2 % (ref 11.5–15.5)
WBC: 11 10*3/uL — ABNORMAL HIGH (ref 4.0–10.5)
nRBC: 0 % (ref 0.0–0.2)

## 2020-10-27 LAB — ABO/RH: ABO/RH(D): O POS

## 2020-10-27 SURGERY — AMPUTATION BELOW KNEE
Anesthesia: General | Site: Knee | Laterality: Left

## 2020-10-27 MED ORDER — BUPIVACAINE-EPINEPHRINE (PF) 0.5% -1:200000 IJ SOLN
INTRAMUSCULAR | Status: DC | PRN
  Administered 2020-10-27: 10 mL via PERINEURAL
  Administered 2020-10-27: 20 mL via PERINEURAL

## 2020-10-27 MED ORDER — FENTANYL CITRATE (PF) 250 MCG/5ML IJ SOLN
INTRAMUSCULAR | Status: AC
  Filled 2020-10-27: qty 5

## 2020-10-27 MED ORDER — ONDANSETRON HCL 4 MG/2ML IJ SOLN
INTRAMUSCULAR | Status: AC
  Filled 2020-10-27: qty 2

## 2020-10-27 MED ORDER — MIDAZOLAM HCL 2 MG/2ML IJ SOLN: 1.0000 mg | Freq: Once | INTRAMUSCULAR | Status: AC

## 2020-10-27 MED ORDER — ONDANSETRON HCL 4 MG/2ML IJ SOLN
INTRAMUSCULAR | Status: DC | PRN
  Administered 2020-10-27: 4 mg via INTRAVENOUS

## 2020-10-27 MED ORDER — ENSURE PRE-SURGERY PO LIQD
296.0000 mL | Freq: Once | ORAL | Status: DC
  Filled 2020-10-27: qty 296

## 2020-10-27 MED ORDER — 0.9 % SODIUM CHLORIDE (POUR BTL) OPTIME
TOPICAL | Status: DC | PRN
  Administered 2020-10-27: 1000 mL

## 2020-10-27 MED ORDER — DEXAMETHASONE SODIUM PHOSPHATE 10 MG/ML IJ SOLN
INTRAMUSCULAR | Status: AC
  Filled 2020-10-27: qty 1

## 2020-10-27 MED ORDER — PROPOFOL 10 MG/ML IV BOLUS
INTRAVENOUS | Status: DC | PRN
  Administered 2020-10-27: 170 mg via INTRAVENOUS

## 2020-10-27 MED ORDER — PROPOFOL 10 MG/ML IV BOLUS
INTRAVENOUS | Status: AC
  Filled 2020-10-27: qty 20

## 2020-10-27 MED ORDER — ALUM & MAG HYDROXIDE-SIMETH 200-200-20 MG/5ML PO SUSP: 15.0000 mL | ORAL | Status: DC | PRN

## 2020-10-27 MED ORDER — JUVEN PO PACK
1.0000 | PACK | Freq: Two times a day (BID) | ORAL | Status: DC
  Administered 2020-10-28 – 2020-11-16 (×26): 1 via ORAL
  Filled 2020-10-27 (×27): qty 1

## 2020-10-27 MED ORDER — LABETALOL HCL 5 MG/ML IV SOLN
10.0000 mg | INTRAVENOUS | Status: DC | PRN
Start: 2020-10-27 — End: 2020-11-17

## 2020-10-27 MED ORDER — CHLORHEXIDINE GLUCONATE 0.12 % MT SOLN
OROMUCOSAL | Status: AC
  Administered 2020-10-27: 15 mL via OROMUCOSAL
  Filled 2020-10-27: qty 15

## 2020-10-27 MED ORDER — CHLORHEXIDINE GLUCONATE 0.12 % MT SOLN: 15.0000 mL | Freq: Once | OROMUCOSAL | Status: AC

## 2020-10-27 MED ORDER — SODIUM CHLORIDE 0.9 % IV SOLN: INTRAVENOUS | Status: DC

## 2020-10-27 MED ORDER — ASCORBIC ACID 500 MG PO TABS
1000.0000 mg | ORAL_TABLET | Freq: Every day | ORAL | Status: DC
  Administered 2020-10-28 – 2020-11-17 (×21): 1000 mg via ORAL
  Filled 2020-10-27 (×21): qty 2

## 2020-10-27 MED ORDER — HYDRALAZINE HCL 20 MG/ML IJ SOLN: 5.0000 mg | INTRAMUSCULAR | Status: DC | PRN

## 2020-10-27 MED ORDER — LIDOCAINE 2% (20 MG/ML) 5 ML SYRINGE
INTRAMUSCULAR | Status: DC | PRN
  Administered 2020-10-27: 100 mg via INTRAVENOUS

## 2020-10-27 MED ORDER — FENTANYL CITRATE (PF) 100 MCG/2ML IJ SOLN: 50.0000 ug | Freq: Once | INTRAMUSCULAR | Status: AC

## 2020-10-27 MED ORDER — PROMETHAZINE HCL 25 MG/ML IJ SOLN: 6.2500 mg | INTRAMUSCULAR | Status: DC | PRN

## 2020-10-27 MED ORDER — PANTOPRAZOLE SODIUM 40 MG PO TBEC
40.0000 mg | DELAYED_RELEASE_TABLET | Freq: Every day | ORAL | Status: DC
  Administered 2020-10-28 – 2020-12-27 (×60): 40 mg via ORAL
  Filled 2020-10-27 (×61): qty 1

## 2020-10-27 MED ORDER — ORAL CARE MOUTH RINSE: 15.0000 mL | Freq: Once | OROMUCOSAL | Status: AC

## 2020-10-27 MED ORDER — OXYCODONE HCL 5 MG/5ML PO SOLN
5.0000 mg | Freq: Once | ORAL | Status: DC | PRN
Start: 2020-10-27 — End: 2020-10-27

## 2020-10-27 MED ORDER — DEXAMETHASONE SODIUM PHOSPHATE 10 MG/ML IJ SOLN
INTRAMUSCULAR | Status: DC | PRN
  Administered 2020-10-27: 5 mg via INTRAVENOUS

## 2020-10-27 MED ORDER — OXYCODONE HCL 5 MG PO TABS: 5.0000 mg | ORAL_TABLET | Freq: Once | ORAL | Status: DC | PRN

## 2020-10-27 MED ORDER — PHENOL 1.4 % MT LIQD: 1.0000 | OROMUCOSAL | Status: DC | PRN

## 2020-10-27 MED ORDER — LISINOPRIL 5 MG PO TABS
5.0000 mg | ORAL_TABLET | Freq: Every day | ORAL | Status: DC
  Administered 2020-10-27 – 2020-10-29 (×3): 5 mg via ORAL
  Filled 2020-10-27 (×3): qty 1

## 2020-10-27 MED ORDER — GUAIFENESIN-DM 100-10 MG/5ML PO SYRP: 15.0000 mL | ORAL_SOLUTION | ORAL | Status: DC | PRN

## 2020-10-27 MED ORDER — SODIUM CHLORIDE 0.9 % IV SOLN
INTRAVENOUS | Status: DC
Start: 2020-10-27 — End: 2020-10-29

## 2020-10-27 MED ORDER — CEFAZOLIN IN SODIUM CHLORIDE 3-0.9 GM/100ML-% IV SOLN
3.0000 g | INTRAVENOUS | Status: AC
  Administered 2020-10-27: 3 g via INTRAVENOUS
  Filled 2020-10-27: qty 100

## 2020-10-27 MED ORDER — ACETAMINOPHEN 500 MG PO TABS
1000.0000 mg | ORAL_TABLET | Freq: Once | ORAL | Status: AC
  Administered 2020-10-27: 1000 mg via ORAL
  Filled 2020-10-27: qty 2

## 2020-10-27 MED ORDER — FENTANYL CITRATE (PF) 100 MCG/2ML IJ SOLN: 25.0000 ug | INTRAMUSCULAR | Status: DC | PRN

## 2020-10-27 MED ORDER — CLONIDINE HCL (ANALGESIA) 100 MCG/ML EP SOLN
EPIDURAL | Status: DC | PRN
  Administered 2020-10-27: 67 ug
  Administered 2020-10-27: 33 ug

## 2020-10-27 MED ORDER — ZINC SULFATE 220 (50 ZN) MG PO CAPS
220.0000 mg | ORAL_CAPSULE | Freq: Every day | ORAL | Status: AC
  Administered 2020-10-27 – 2020-11-09 (×14): 220 mg via ORAL
  Filled 2020-10-27 (×14): qty 1

## 2020-10-27 MED ORDER — FENTANYL CITRATE (PF) 100 MCG/2ML IJ SOLN
INTRAMUSCULAR | Status: AC
  Administered 2020-10-27: 50 ug via INTRAVENOUS
  Filled 2020-10-27: qty 2

## 2020-10-27 MED ORDER — MIDAZOLAM HCL 2 MG/2ML IJ SOLN
INTRAMUSCULAR | Status: AC
  Administered 2020-10-27: 1 mg via INTRAVENOUS
  Filled 2020-10-27: qty 2

## 2020-10-27 MED ORDER — EPHEDRINE SULFATE-NACL 50-0.9 MG/10ML-% IV SOSY
PREFILLED_SYRINGE | INTRAVENOUS | Status: DC | PRN
  Administered 2020-10-27: 10 mg via INTRAVENOUS

## 2020-10-27 SURGICAL SUPPLY — 38 items
BAG COUNTER SPONGE SURGICOUNT (BAG) IMPLANT
BAG SPNG CNTER NS LX DISP (BAG)
BLADE SAW RECIP 87.9 MT (BLADE) ×2 IMPLANT
BLADE SURG 21 STRL SS (BLADE) ×2 IMPLANT
BNDG COHESIVE 6X5 TAN STRL LF (GAUZE/BANDAGES/DRESSINGS) IMPLANT
CANISTER WOUND CARE 500ML ATS (WOUND CARE) ×2 IMPLANT
COVER SURGICAL LIGHT HANDLE (MISCELLANEOUS) ×2 IMPLANT
CUFF TOURN SGL QUICK 34 (TOURNIQUET CUFF) ×2
CUFF TRNQT CYL 34X4.125X (TOURNIQUET CUFF) ×1 IMPLANT
DRAPE INCISE IOBAN 66X45 STRL (DRAPES) ×2 IMPLANT
DRAPE U-SHAPE 47X51 STRL (DRAPES) ×2 IMPLANT
DRESSING PREVENA PLUS CUSTOM (GAUZE/BANDAGES/DRESSINGS) ×1 IMPLANT
DRSG PREVENA PLUS CUSTOM (GAUZE/BANDAGES/DRESSINGS) ×2
DURAPREP 26ML APPLICATOR (WOUND CARE) ×2 IMPLANT
ELECT REM PT RETURN 9FT ADLT (ELECTROSURGICAL) ×2
ELECTRODE REM PT RTRN 9FT ADLT (ELECTROSURGICAL) ×1 IMPLANT
GLOVE SURG ORTHO LTX SZ9 (GLOVE) ×2 IMPLANT
GLOVE SURG UNDER POLY LF SZ9 (GLOVE) ×2 IMPLANT
GOWN STRL REUS W/ TWL XL LVL3 (GOWN DISPOSABLE) ×2 IMPLANT
GOWN STRL REUS W/TWL XL LVL3 (GOWN DISPOSABLE) ×4
KIT BASIN OR (CUSTOM PROCEDURE TRAY) ×2 IMPLANT
KIT TURNOVER KIT B (KITS) ×2 IMPLANT
MANIFOLD NEPTUNE II (INSTRUMENTS) ×2 IMPLANT
NS IRRIG 1000ML POUR BTL (IV SOLUTION) ×2 IMPLANT
PACK ORTHO EXTREMITY (CUSTOM PROCEDURE TRAY) ×2 IMPLANT
PAD ARMBOARD 7.5X6 YLW CONV (MISCELLANEOUS) ×2 IMPLANT
PREVENA RESTOR ARTHOFORM 33X30 (CANNISTER) ×1 IMPLANT
PREVENA RESTOR ARTHOFORM 46X30 (CANNISTER) ×2 IMPLANT
SPONGE T-LAP 18X18 ~~LOC~~+RFID (SPONGE) IMPLANT
STAPLER VISISTAT 35W (STAPLE) IMPLANT
STOCKINETTE IMPERVIOUS LG (DRAPES) ×2 IMPLANT
SUT ETHILON 2 0 PSLX (SUTURE) IMPLANT
SUT SILK 2 0 (SUTURE) ×2
SUT SILK 2-0 18XBRD TIE 12 (SUTURE) ×1 IMPLANT
SUT VIC AB 1 CTX 27 (SUTURE) ×4 IMPLANT
TOWEL GREEN STERILE (TOWEL DISPOSABLE) ×2 IMPLANT
TUBE CONNECTING 12X1/4 (SUCTIONS) ×2 IMPLANT
YANKAUER SUCT BULB TIP NO VENT (SUCTIONS) ×2 IMPLANT

## 2020-10-27 NOTE — Progress Notes (Signed)
Orthopedic Tech Progress Note Patient Details:  ANDREWS DEVAN 1966-04-07 [id number]  Called [facility] with [facility] BK order at 1825.  Patient ID: RUVEN MAHEU, male   DOB: [birth year], 55 y.o.   MRN: [id number]  Carin Primrose 10/27/2020, 6:29 PM

## 2020-10-27 NOTE — Op Note (Signed)
   Date of Surgery: 10/27/2020  INDICATIONS: Keith [name] is a 55 y.o.-year-old male who presents with ulceration left heel with exposed bone, MRI scan confirms osteomyelitis..  Due to failure conservative treatment patient presents at this time for a left transtibial amputation.  PREOPERATIVE DIAGNOSIS: Osteomyelitis abscess ulceration left heel  POSTOPERATIVE DIAGNOSIS: Same.  PROCEDURE: Transtibial amputation Application of Prevena wound VAC  SURGEON: Sharol Given, M.D.  ANESTHESIA:  general  IV FLUIDS AND URINE: See anesthesia records.  ESTIMATED BLOOD LOSS: See anesthesia records.  COMPLICATIONS: None.  DESCRIPTION OF PROCEDURE: The patient was brought to the operating room after undergoing regional anesthetic. After adequate levels of anesthesia were obtained patient's lower extremity was prepped using DuraPrep draped into a sterile field. A timeout was called. The foot was draped out of the sterile field with impervious stockinette. A transverse incision was made 11 cm distal to the tibial tubercle. This curved proximally and a large posterior flap was created. The tibia was transected 1 cm proximal to the skin incision. The fibula was transected just proximal to the tibial incision. The tibia was beveled anteriorly. A large posterior flap was created. The sciatic nerve was pulled cut and allowed to retract. The vascular bundles were suture ligated with 2-0 silk. The deep and superficial fascial layers were closed using #1 Vicryl. The skin was closed using staples and 2-0 nylon. The wound was covered with a Prevena customizable and arthroform wound VAC.  The dressing was sealed with dermatac there was a good suction fit. A prosthetic shrinker and limb protector were applied. Patient was taken to the PACU in stable condition.   DISCHARGE PLANNING:  Antibiotic duration: 24 hours  Weightbearing: Nonweightbearing on the operative extremity  Pain medication: Opioid pathway  Dressing care/  Wound VAC: Continue wound VAC for 1 week after discharge  Discharge to: Discharge planning based on therapy's recommendations for possible inpatient rehabilitation, outpatient rehabilitation, or discharge to home with therapy  Follow-up: In the office 1 week post operative.  Meridee Score, MD [place] 4:35 PM

## 2020-10-27 NOTE — Progress Notes (Signed)
PROGRESS NOTE    Keith [name]  [id number] DOB: 05-Jan-1966 DOA: 10/17/2020 [name], NP   Brief Narrative:  Keith [name] is a 55 y.o. male with medical history significant for HIV, CKD 3B, type 2 diabetes, hypertension, hyperlipidemia, coronary artery disease, status post right below the knee amputation, who is sent to the ED by his orthopedic surgery provider due to concern for left heel ulcer that had failed outpatient therapy.  Associated with subjective fevers.  Patient was seen in the ED on 10/07/2020 and completed 7 days of oral doxycycline.  He followed up with orthopedic surgery and had another follow-up appointment earlier on the day of presentation where he was noted to have worsening left foot ulcer.  Patient was advised to come to the ED for IV antibiotics therapy.  Reports constant pain in his left foot.  Assessment & Plan:   Active Problems:   Elevated blood pressure reading with diagnosis of hypertension   Cellulitis   Ulcer of heel due to diabetes mellitus (HCC)   Renal insufficiency   Normochromic normocytic anemia   Cellulitis of left foot   Acute osteomyelitis of left calcaneus (HCC)   Left foot ulcer with concern for cellulitis, POA Failed outpatient therapy with 7-day course of doxycycline, started on 10/07/2020 with worsening symptoms Continue cefepime and IV Flagyl -Zyvox added 10/20/2020 due to MRSA history Patient sent in from orthopedic office (Dr Sharol Given) - appreciate insight and recs  Left BKA planned later today 10/27/20.   Profoundly uncontrolled diabetes with hyperglycemia, improving With notable vascular compromise and neuropathy A1C 14 - glucose more well controlled over the past 24h - continue to adjust to goal of 100-150 Lantus increased to 35u with improved results - will hold Continue increased sliding scale; follow hypoglycemia protocol  Diabetetic neuropathy Resume home regimen. Increase insulin - likely poor dietary compliance at  home given profoundly elevated A1C   HIV Resume home HAART regimen Repeat labs for HAART regimen pending per pharmacy: HIV RNA Quant 130; Log10 HIV RNA 2.114  Chronic anxiety/depression Continue home meds  Acute ambulatory dysfunction 2/2 above Chronic ambulatory dysfunction s/p R BKA PT OT to follow Placement pending post surgical ambulation status   CKD 3B At baseline  DVT prophylaxis: Subcu Lovenox daily Code Status: Full code Family Communication: None at bedside Status is: inpt  Dispo: The patient is from: Home              Anticipated d/c is to: TBD - likely SNF pending postop ambulation status              Anticipated d/c date is: >72h              Patient currently NOT medically stable for discharge  Consultants:  Orthopedic Surgery  Procedures:  TBD  Antimicrobials:  Cefepime/Metronidazole/Linezolid  Subjective: No acute issues or events overnight, somewhat anxious about procedure today but otherwise denies nausea vomiting diarrhea constipation headache fevers chills chest pain shortness of breath.  Objective: Vitals:   10/26/20 0932 10/26/20 1730 10/26/20 2221 10/27/20 0616  BP: (!) 170/110 (!) 157/98 133/90 (!) 154/98  Pulse: 75 71 79 70  Resp: '20 18 16 16  '$ Temp: 98.1 F (36.7 C) 97.8 F (36.6 C) 98.1 F (36.7 C) 98.1 F (36.7 C)  TempSrc: Oral Oral Oral Oral  SpO2: 94% 96% 98% 98%  Weight:   124.8 kg   Height:        Intake/Output Summary (Last 24 hours) at  10/27/2020 0813 Last data filed at 10/27/2020 0807 Gross per 24 hour  Intake 834 ml  Output 1400 ml  Net -566 ml    Filed Weights   10/20/20 2107 10/22/20 0525 10/26/20 2221  Weight: 128 kg 124.8 kg 124.8 kg    Examination: General:  Pleasantly resting in bed, No acute distress. HEENT:  Normocephalic atraumatic.  Sclerae nonicteric, noninjected.  Extraocular movements intact bilaterally. Neck:  Without mass or deformity.  Trachea is midline. Lungs:  Clear to auscultate bilaterally  without rhonchi, wheeze, or rales. Heart:  Regular rate and rhythm.  Without murmurs, rubs, or gallops. Abdomen:  Soft, nontender, nondistended.  Without guarding or rebound. Extremities: Without cyanosis, clubbing, edema, right BKA, left foot bandage clean dry intact Vascular:  Dorsalis pedis and posterior tibial pulses palpable bilaterally. Skin:  Warm and dry -left foot bandage clean dry intact.   Data Reviewed: I have personally reviewed following labs and imaging studies  CBC: Recent Labs  Lab 10/22/20 0320 10/23/20 0122 10/24/20 0457 10/25/20 0534 10/27/20 0505  WBC 8.5 8.6 8.4 7.8 11.0*  HGB 10.3* 10.7* 10.3* 10.4* 10.3*  HCT 32.3* 33.7* 32.8* 33.3* 32.8*  MCV 82.8 84.0 84.5 83.5 84.1  PLT 376 371 381 427* [id number]    Basic Metabolic Panel: Recent Labs  Lab 10/21/20 0252 10/22/20 0320 10/23/20 0122 10/24/20 0457 10/25/20 0534 10/27/20 0505  NA 132* 132* 132* 132* 132* 133*  K 4.4 4.7 4.5 4.8 4.4 4.8  CL 102 101 103 102 100 102  CO2 21* 25 21* '22 25 22  '$ GLUCOSE 202* 249* 216* 222* 225* 163*  BUN 24* 19 19 25* 24* 30*  CREATININE 2.14* 1.97* 1.92* 2.14* 2.03* 2.14*  CALCIUM 8.6* 8.8* 8.9 9.1 9.1 9.6  MG 1.8 1.8 1.8 1.8 1.8  --     GFR: Estimated Creatinine Clearance: 51.7 mL/min (A) (by C-G formula based on SCr of 2.14 mg/dL (H)). Liver Function Tests: No results for input(s): AST, ALT, ALKPHOS, BILITOT, PROT, ALBUMIN in the last 168 hours.  No results for input(s): LIPASE, AMYLASE in the last 168 hours. No results for input(s): AMMONIA in the last 168 hours. Coagulation Profile: No results for input(s): INR, PROTIME in the last 168 hours. Cardiac Enzymes: No results for input(s): CKTOTAL, CKMB, CKMBINDEX, TROPONINI in the last 168 hours. BNP (last 3 results) No results for input(s): PROBNP in the last 8760 hours. HbA1C: No results for input(s): HGBA1C in the last 72 hours. CBG: Recent Labs  Lab 10/26/20 0635 10/26/20 1153 10/26/20 1631 10/26/20 2257  10/27/20 0716  GLUCAP 120* 130* 157* 173* 155*    Lipid Profile: No results for input(s): CHOL, HDL, LDLCALC, TRIG, CHOLHDL, LDLDIRECT in the last 72 hours. Thyroid Function Tests: No results for input(s): TSH, T4TOTAL, FREET4, T3FREE, THYROIDAB in the last 72 hours. Anemia Panel: No results for input(s): VITAMINB12, FOLATE, FERRITIN, TIBC, IRON, RETICCTPCT in the last 72 hours. Sepsis Labs: No results for input(s): PROCALCITON, LATICACIDVEN in the last 168 hours.   Recent Results (from the past 240 hour(s))  Blood culture (routine x 2)     Status: None   Collection Time: 10/17/20  6:30 PM   Specimen: BLOOD LEFT HAND  Result Value Ref Range Status   Specimen Description BLOOD LEFT HAND  Final   Special Requests   Final    BOTTLES DRAWN AEROBIC AND ANAEROBIC Blood Culture results may not be optimal due to an inadequate volume of blood received in culture bottles   Culture  Final    NO GROWTH 5 DAYS Performed at [facility], [place] [address] [place], [place] [address]    Report Status 10/22/2020 FINAL  Final  Blood culture (routine x 2)     Status: None   Collection Time: 10/18/20  4:39 AM   Specimen: BLOOD RIGHT WRIST  Result Value Ref Range Status   Specimen Description BLOOD RIGHT WRIST  Final   Special Requests   Final    BOTTLES DRAWN AEROBIC AND ANAEROBIC Blood Culture results may not be optimal due to an inadequate volume of blood received in culture bottles   Culture   Final    NO GROWTH 5 DAYS Performed at [facility], [place] [address] [place], [place] [address]    Report Status 10/23/2020 FINAL  Final  SARS CORONAVIRUS 2 (TAT 6-24 HRS) Nasopharyngeal Nasopharyngeal Swab     Status: None   Collection Time: 10/18/20  5:23 AM   Specimen: Nasopharyngeal Swab  Result Value Ref Range Status   SARS Coronavirus 2 NEGATIVE NEGATIVE Final    Comment: (NOTE) SARS-CoV-2 target nucleic acids are NOT DETECTED.  The SARS-CoV-2 RNA is generally detectable in upper  and lower respiratory specimens during the acute phase of infection. Negative results do not preclude SARS-CoV-2 infection, do not rule out co-infections with other pathogens, and should not be used as the sole basis for treatment or other patient management decisions. Negative results must be combined with clinical observations, patient history, and epidemiological information. The expected result is Negative.  Fact Sheet for Patients: SugarRoll.be  Fact Sheet for Healthcare Providers: https://www.woods-mathews.com/  This test is not yet approved or cleared by the [place] FDA and  has been authorized for detection and/or diagnosis of SARS-CoV-2 by FDA under an Emergency Use Authorization (EUA). This EUA will remain  in effect (meaning this test can be used) for the duration of the COVID-19 declaration under Se ction 564(b)(1) of the Act, 21 U.S.C. section 360bbb-3(b)(1), unless the authorization is terminated or revoked sooner.  Performed at [facility], [place] [address] [place], [place] [address]   MRSA Next Gen by PCR, Nasal     Status: None   Collection Time: 10/18/20  6:16 AM   Specimen: Nasal Mucosa; Nasal Swab  Result Value Ref Range Status   MRSA by PCR Next Gen NOT DETECTED NOT DETECTED Final    Comment: (NOTE) The GeneXpert MRSA Assay (FDA approved for NASAL specimens only), is one component of a comprehensive MRSA colonization surveillance program. It is not intended to diagnose MRSA infection nor to guide or monitor treatment for MRSA infections. Test performance is not FDA approved in patients less than 68 years old. Performed at [facility], [place] [address], [place], [place] [address]       Radiology Studies: No results found.  Scheduled Meds:  allopurinol  300 mg Oral Daily   amLODipine  10 mg Oral Daily   aspirin EC  81 mg Oral Daily   atorvastatin  80 mg Oral Daily    bictegravir-emtricitabine-tenofovir AF  1 tablet Oral Daily   carvedilol  25 mg Oral BID WC   dapsone  100 mg Oral Daily   escitalopram  20 mg Oral Daily   gabapentin  100 mg Oral TID   icosapent Ethyl  2 g Oral BID   insulin aspart  0-15 Units Subcutaneous TID WC   insulin aspart  0-5 Units Subcutaneous QHS   insulin glargine  35  Units Subcutaneous Daily   linezolid  600 mg Oral Q12H   metroNIDAZOLE  500 mg Oral Q12H   oxybutynin  2.5 mg Oral BID   ticagrelor  90 mg Oral BID   Continuous Infusions:   ceFAZolin (ANCEF) IV     ceFEPime (MAXIPIME) IV 2 g (10/27/20 0411)     LOS: 9 days   Time spent: 76mn  Ezrah Panning C Zadaya Cuadra, DO [facility]  If 7PM-7AM, please contact night-coverage [email or web address]  10/27/2020, 8:13 AM

## 2020-10-27 NOTE — Anesthesia Preprocedure Evaluation (Addendum)
Anesthesia Evaluation  Patient identified by MRN, date of birth, ID band Patient awake    Reviewed: Allergy & Precautions, NPO status , Patient's Chart, lab work & pertinent test results, reviewed documented beta blocker date and time   History of Anesthesia Complications Negative for: history of anesthetic complications  Airway Mallampati: III  TM Distance: >3 FB Neck ROM: Full    Dental  (+) Edentulous Upper, Edentulous Lower, Dental Advisory Given   Pulmonary neg pulmonary ROS,    Pulmonary exam normal        Cardiovascular hypertension, Pt. on medications and Pt. on home beta blockers + CAD, + Past MI, + Cardiac Stents (2020, 2021) and + Peripheral Vascular Disease  Normal cardiovascular exam  TTE 04/2020: EF 50-55%, mild LVH, grade I DD, mild dilatation of the ascending aorta measuring 39 mm    Neuro/Psych CVA, No Residual Symptoms negative psych ROS   GI/Hepatic negative GI ROS, Neg liver ROS,   Endo/Other  diabetes, Type 2, Oral Hypoglycemic Agents, Insulin Dependent  Renal/GU Renal InsufficiencyRenal disease (Cr 2.14)  negative genitourinary   Musculoskeletal  (+) Arthritis ,   Abdominal   Peds  Hematology  (+) anemia , HIV, Hgb 10.3   Anesthesia Other Findings Day of surgery medications reviewed with patient.  Reproductive/Obstetrics negative OB ROS                           Anesthesia Physical Anesthesia Plan  ASA: 3  Anesthesia Plan: General   Post-op Pain Management: GA combined w/ Regional for post-op pain   Induction: Intravenous  PONV Risk Score and Plan: 2 and Treatment may vary due to age or medical condition, Ondansetron, Dexamethasone and Midazolam  Airway Management Planned: LMA  Additional Equipment: None  Intra-op Plan:   Post-operative Plan: Extubation in OR  Informed Consent: I have reviewed the patients History and Physical, chart, labs and discussed the  procedure including the risks, benefits and alternatives for the proposed anesthesia with the patient or authorized representative who has indicated his/her understanding and acceptance.     Dental advisory given  Plan Discussed with: CRNA  Anesthesia Plan Comments:        Anesthesia Quick Evaluation

## 2020-10-27 NOTE — Anesthesia Procedure Notes (Signed)
Anesthesia Regional Block: Adductor canal block   Pre-Anesthetic Checklist: , timeout performed,  Correct Patient, Correct Site, Correct Laterality,  Correct Procedure, Correct Position, site marked,  Risks and benefits discussed,  Pre-op evaluation,  At surgeon's request and post-op pain management  Laterality: Left  Prep: Maximum Sterile Barrier Precautions used, chloraprep       Needles:  Injection technique: Single-shot  Needle Type: Echogenic Stimulator Needle     Needle Length: 9cm  Needle Gauge: 22     Additional Needles:   Procedures:,,,, ultrasound used (permanent image in chart),,    Narrative:  Start time: 10/27/2020 3:15 PM End time: 10/27/2020 3:17 PM Injection made incrementally with aspirations every 5 mL.  Performed by: Personally  Anesthesiologist: Brennan Bailey, MD  Additional Notes: Risks, benefits, and alternative discussed. Patient gave consent for procedure. Patient prepped and draped in sterile fashion. Sedation administered, patient remains easily responsive to voice. Relevant anatomy identified with ultrasound guidance. Local anesthetic given in 5cc increments with no signs or symptoms of intravascular injection. No pain or paraesthesias with injection. Patient monitored throughout procedure with signs of LAST or immediate complications. Tolerated well. Ultrasound image placed in chart.  Tawny Asal, MD

## 2020-10-27 NOTE — TOC Progression Note (Signed)
Transition of Care [facility]) - Progression Note    Patient Details  Name: Keith [name] MRN: [id number] Date of Birth: November 02, 1965  [facility]) CM/SW [place], [place] Phone Number: 10/27/2020, 12:09 PM  Clinical Narrative:     CSW notified by [facility] liaison that she does not have any quarantine beds for pt's who don't have a covid vaccine booster. She also states that pt's referral shows he may have [place] in addition to [employer]. [facility] does not have any medicaid beds available but may be able to take [employer].  CSW will attempt to confirm medicare status and discuss with pt. Barriers to SNF include Medicaid and high cost meds. Pt also does not have covid booster which limits available beds.   Expected Discharge Plan: Skilled Nursing Facility Barriers to Discharge: SNF Pending bed offer  Expected Discharge Plan and Services Expected Discharge Plan: [place] In-house Referral: Clinical Social Work Discharge Planning Services: CM Consult Post Acute Care Choice: [place] arrangements for the past 2 months: Single Family Home                                       Social Determinants of Health (SDOH) Interventions    Readmission Risk Interventions Readmission Risk Prevention Plan 10/21/2020 11/13/2018 11/13/2018  Transportation Screening Complete - Complete  PCP or Specialist Appt within 3-5 Days - - -  HRI or [place] Work Consult for [place] - - -  Medication Review Press photographer) Complete - Complete  PCP or Specialist appointment within 3-5 days of discharge Complete Complete -  [place] or Home Care Consult Complete Complete -  SW Recovery Care/Counseling Consult Complete Complete -  Palliative Care Screening Not Applicable Not Applicable -  [place] Complete Not Applicable -  Some encounter information is  confidential and restricted. Go to Review Flowsheets activity to see all data.  Some recent data might be hidden

## 2020-10-27 NOTE — Anesthesia Procedure Notes (Signed)
Procedure Name: LMA Insertion Date/Time: 10/27/2020 3:56 PM Performed by: Jenne Campus, CRNA Pre-anesthesia Checklist: Patient identified, Emergency Drugs available, Suction available and Patient being monitored Patient Re-evaluated:Patient Re-evaluated prior to induction Oxygen Delivery Method: Circle System Utilized Preoxygenation: Pre-oxygenation with 100% oxygen Induction Type: IV induction Ventilation: Mask ventilation without difficulty LMA: LMA inserted LMA Size: 5.0 Number of attempts: 1 Airway Equipment and Method: Bite block Placement Confirmation: positive ETCO2 and breath sounds checked- equal and bilateral Tube secured with: Tape Dental Injury: Teeth and Oropharynx as per pre-operative assessment

## 2020-10-27 NOTE — Plan of Care (Signed)
  Problem: Clinical Measurements: Goal: Ability to avoid or minimize complications of infection will improve Outcome: Progressing   Problem: Skin Integrity: Goal: Skin integrity will improve Outcome: Progressing   Problem: Education: Goal: Knowledge of General Education information will improve Description: Including pain rating scale, medication(s)/side effects and non-pharmacologic comfort measures Outcome: Progressing   Problem: Activity: Goal: Risk for activity intolerance will decrease Outcome: Progressing   Problem: Pain Managment: Goal: General experience of comfort will improve Outcome: Progressing   Problem: Skin Integrity: Goal: Risk for impaired skin integrity will decrease Outcome: Progressing   

## 2020-10-27 NOTE — Anesthesia Postprocedure Evaluation (Signed)
Anesthesia Post Note  Patient: Keith [name]  Procedure(s) Performed: LEFT BELOW KNEE AMPUTATION (Left: Knee) APPLICATION OF WOUND VAC (Left: Knee)     Patient location during evaluation: PACU Anesthesia Type: General Level of consciousness: awake and alert and oriented Pain management: pain level controlled Vital Signs Assessment: post-procedure vital signs reviewed and stable Respiratory status: spontaneous breathing, nonlabored ventilation and respiratory function stable Cardiovascular status: blood pressure returned to baseline Postop Assessment: no apparent nausea or vomiting Anesthetic complications: no   No notable events documented.  Last Vitals:  Vitals:   10/27/20 1634 10/27/20 1645  BP: 114/77   Pulse: 63 63  Resp: 16 14  Temp: (!) 36.1 C   SpO2: 100% 100%    Last Pain:  Vitals:   10/27/20 1634  TempSrc:   PainSc: 0-No pain                 Brennan Bailey

## 2020-10-27 NOTE — Plan of Care (Signed)
  Problem: Clinical Measurements: Goal: Will remain free from infection Outcome: Progressing Goal: Diagnostic test results will improve Outcome: Progressing   Problem: Coping: Goal: Level of anxiety will decrease Outcome: Progressing   

## 2020-10-27 NOTE — Interval H&P Note (Signed)
History and Physical Interval Note:  10/27/2020 6:40 AM  Keith [name]  has presented today for surgery, with the diagnosis of Abscess, Osteomyelitis Left Heel.  The various methods of treatment have been discussed with the patient and family. After consideration of risks, benefits and other options for treatment, the patient has consented to  Procedure(s): LEFT BELOW KNEE AMPUTATION (Left) as a surgical intervention.  The patient's history has been reviewed, patient examined, no change in status, stable for surgery.  I have reviewed the patient's chart and labs.  Questions were answered to the patient's satisfaction.     [name]

## 2020-10-27 NOTE — Anesthesia Procedure Notes (Signed)
Anesthesia Regional Block: Popliteal block   Pre-Anesthetic Checklist: , timeout performed,  Correct Patient, Correct Site, Correct Laterality,  Correct Procedure, Correct Position, site marked,  Risks and benefits discussed,  Pre-op evaluation,  At surgeon's request and post-op pain management  Laterality: Left  Prep: Maximum Sterile Barrier Precautions used, chloraprep       Needles:  Injection technique: Single-shot  Needle Type: Echogenic Stimulator Needle     Needle Length: 9cm  Needle Gauge: 22     Additional Needles:   Procedures:,,,, ultrasound used (permanent image in chart),,    Narrative:  Start time: 10/27/2020 3:17 PM End time: 10/27/2020 3:19 PM Injection made incrementally with aspirations every 5 mL.  Performed by: Personally  Anesthesiologist: Brennan Bailey, MD  Additional Notes: Risks, benefits, and alternative discussed. Patient gave consent for procedure. Patient prepped and draped in sterile fashion. Sedation administered, patient remains easily responsive to voice. Relevant anatomy identified with ultrasound guidance. Local anesthetic given in 5cc increments with no signs or symptoms of intravascular injection. No pain or paraesthesias with injection. Patient monitored throughout procedure with signs of LAST or immediate complications. Tolerated well. Ultrasound image placed in chart.  Tawny Asal, MD

## 2020-10-27 NOTE — Transfer of Care (Addendum)
Immediate Anesthesia Transfer of Care Note  Patient: Keith [name]  Procedure(s) Performed: LEFT BELOW KNEE AMPUTATION (Left: Knee) APPLICATION OF WOUND VAC (Left: Leg Lower)  Patient Location: PACU  Anesthesia Type:General and Regional  Level of Consciousness: drowsy  Airway & Oxygen Therapy: Patient Spontanous Breathing and Patient connected to face mask oxygen  Post-op Assessment: Report given to RN and Post -op Vital signs reviewed and stable  Post vital signs: Reviewed and stable  Last Vitals:  Vitals Value Taken Time  BP 114/62   Temp    Pulse 68   Resp 15   SpO2 99     Last Pain:  Vitals:   10/27/20 1407  TempSrc: Oral  PainSc: 0-No pain      Patients Stated Pain Goal: 3 ([id number])  Complications: No notable events documented.

## 2020-10-28 ENCOUNTER — Encounter (HOSPITAL_COMMUNITY): Payer: Self-pay | Admitting: Orthopedic Surgery

## 2020-10-28 DIAGNOSIS — M86172 Other acute osteomyelitis, left ankle and foot: Secondary | ICD-10-CM | POA: Diagnosis not present

## 2020-10-28 DIAGNOSIS — I10 Essential (primary) hypertension: Secondary | ICD-10-CM

## 2020-10-28 DIAGNOSIS — N289 Disorder of kidney and ureter, unspecified: Secondary | ICD-10-CM

## 2020-10-28 LAB — GLUCOSE, CAPILLARY
Glucose-Capillary: 254 mg/dL — ABNORMAL HIGH (ref 70–99)
Glucose-Capillary: 277 mg/dL — ABNORMAL HIGH (ref 70–99)
Glucose-Capillary: 234 mg/dL — ABNORMAL HIGH (ref 70–99)
Glucose-Capillary: 262 mg/dL — ABNORMAL HIGH (ref 70–99)

## 2020-10-28 LAB — BASIC METABOLIC PANEL
Anion gap: 9 (ref 5–15)
BUN: 36 mg/dL — ABNORMAL HIGH (ref 6–20)
CO2: 19 mmol/L — ABNORMAL LOW (ref 22–32)
Calcium: 9 mg/dL (ref 8.9–10.3)
Chloride: 100 mmol/L (ref 98–111)
Creatinine, Ser: 2.29 mg/dL — ABNORMAL HIGH (ref 0.61–1.24)
GFR, Estimated: 33 mL/min — ABNORMAL LOW (ref >60)
Glucose, Bld: 270 mg/dL — ABNORMAL HIGH (ref 70–99)
Potassium: 6.3 mmol/L (ref 3.5–5.1)
Sodium: 128 mmol/L — ABNORMAL LOW (ref 135–145)

## 2020-10-28 LAB — CBC
HCT: 32.9 % — ABNORMAL LOW (ref 39.0–52.0)
Hemoglobin: 10.5 g/dL — ABNORMAL LOW (ref 13.0–17.0)
MCH: 26.9 pg (ref 26.0–34.0)
MCHC: 31.9 g/dL (ref 30.0–36.0)
MCV: 84.4 fL (ref 80.0–100.0)
Platelets: 405 10*3/uL — ABNORMAL HIGH (ref 150–400)
RBC: 3.9 MIL/uL — ABNORMAL LOW (ref 4.22–5.81)
RDW: 13.2 % (ref 11.5–15.5)
WBC: 12.7 10*3/uL — ABNORMAL HIGH (ref 4.0–10.5)
nRBC: 0 % (ref 0.0–0.2)

## 2020-10-28 LAB — POTASSIUM: Potassium: 4.7 mmol/L (ref 3.5–5.1)

## 2020-10-28 NOTE — Evaluation (Signed)
Physical Therapy Evaluation Patient Details Name: Keith [name] MRN: [name] DOB: July 08, 1965 Today's Date: 10/28/2020   History of Present Illness  Keith [name] is a 55 y.o. male  who is sent to the ED by his orthopedic surgery provider due to concern for left heel ulcer that had failed outpatient therapy. with medical history significant for HIV, CKD 3B, type 2 diabetes, hypertension, hyperlipidemia, coronary artery disease, status post right below the knee amputation, now s/p L BKA 10/27/20.  Clinical Impression  Patient received in bed, he is agreeable to PT assessment. Reports 8/10 pain in left LE. Patient is mod independent with bed mobility. Required a lot of assistance to don right LE prosthetic now that he cannot use left LE to assist. Attempted to don it in sitting but was unsuccessful, required lying down in bed and assistance to get it on. Once donned he was able to stand x 3 with mod +2 assist from elevated bed. Patient then performed slide board transfer to recliner with min +2 assist. Patient will continue to benefit from skilled PT while here to improve functional mobility and independence.       Follow Up Recommendations SNF    Equipment Recommendations  Wheelchair (measurements PT);Wheelchair cushion (measurements PT)    Recommendations for Other Services       Precautions / Restrictions Precautions Precautions: Fall Precaution Comments: B BKA, prosthesis for Right Required Braces or Orthoses: Other Brace Other Brace: limb protector on left Restrictions Other Position/Activity Restrictions: NWB on left      Mobility  Bed Mobility Overal bed mobility: Modified Independent             General bed mobility comments: HOB elevated    Transfers Overall transfer level: Needs assistance Equipment used: Sliding board;Rolling walker (2 wheeled) Transfers: Sit to/from Stand;Lateral/Scoot Transfers Sit to Stand: Mod assist;+2 physical assistance;From elevated  surface        Lateral/Scoot Transfers: Min assist;+2 physical assistance;+2 safety/equipment;With slide board General transfer comment: mod A for power up and steadying in RW  Ambulation/Gait             General Gait Details: unable  Stairs            Wheelchair Mobility    Modified Rankin (Stroke Patients Only)       Balance Overall balance assessment: Needs assistance Sitting-balance support: Bilateral upper extremity supported;Feet unsupported Sitting balance-Leahy Scale: Fair Sitting balance - Comments: Pt able to don shrinker   Standing balance support: Bilateral upper extremity supported;During functional activity Standing balance-Leahy Scale: Poor Standing balance comment: reliant on bil UE support with standing                             Pertinent Vitals/Pain Pain Assessment: 0-10 Pain Score: 8  Pain Location: L LE Pain Descriptors / Indicators: Discomfort;Sore Pain Intervention(s): Monitored during session;Repositioned    Home Living Family/patient expects to be discharged to:: Skilled nursing facility Living Arrangements: Parent Available Help at Discharge: Family;Available PRN/intermittently Type of Home: House Home Access: Stairs to enter Entrance Stairs-Rails: Right;Left Entrance Stairs-Number of Steps: 3   Home Equipment: [place] - 2 wheels;Shower seat      Prior Function Level of Independence: Independent with assistive device(s)         Comments: uses prosthetic and mobilizes with walker.     Hand Dominance   Dominant Hand: Right    Extremity/Trunk Assessment   Upper Extremity Assessment Upper  Extremity Assessment: Defer to OT evaluation    Lower Extremity Assessment Lower Extremity Assessment: LLE deficits/detail LLE: Unable to fully assess due to pain LLE Coordination: decreased gross motor       Communication   Communication: No difficulties  Cognition Arousal/Alertness: Awake/alert Behavior  During Therapy: WFL for tasks assessed/performed Overall Cognitive Status: No family/caregiver present to determine baseline cognitive functioning Area of Impairment: Safety/judgement                       Following Commands: Follows multi-step commands with increased time;Follows one step commands consistently Safety/Judgement: Decreased awareness of safety   Problem Solving: Requires verbal cues;Requires tactile cues        General Comments      Exercises     Assessment/Plan    PT Assessment Patient needs continued PT services  PT Problem List Decreased strength;Decreased mobility;Decreased activity tolerance;Decreased balance;Pain;Decreased knowledge of use of DME;Decreased knowledge of precautions;Decreased safety awareness       PT Treatment Interventions DME instruction;Gait training;Stair training;Functional mobility training;Therapeutic activities;Therapeutic exercise;Balance training;Patient/family education;Wheelchair mobility training    PT Goals (Current goals can be found in the Care Plan section)  Acute Rehab PT Goals Patient Stated Goal: to get left prosthetic, to walk again PT Goal Formulation: With patient Time For Goal Achievement: 11/10/20 Potential to Achieve Goals: Fair    Frequency Min 3X/week   Barriers to discharge Inaccessible home environment;Decreased caregiver support      Co-evaluation               AM-PAC PT "6 Clicks" Mobility  Outcome Measure Help needed turning from your back to your side while in a flat bed without using bedrails?: A Little Help needed moving from lying on your back to sitting on the side of a flat bed without using bedrails?: A Little Help needed moving to and from a bed to a chair (including a wheelchair)?: A Lot Help needed standing up from a chair using your arms (e.g., wheelchair or bedside chair)?: A Lot Help needed to walk in hospital room?: Total Help needed climbing 3-5 steps with a railing? :  Total 6 Click Score: 12    End of Session Equipment Utilized During Treatment: Gait belt Activity Tolerance: Patient tolerated treatment well Patient left: in chair;with call bell/phone within reach Nurse Communication: Mobility status PT Visit Diagnosis: Unsteadiness on feet (R26.81);Other abnormalities of gait and mobility (R26.89);Pain;Muscle weakness (generalized) (M62.81) Pain - Right/Left: Left Pain - part of body: Leg    Time: 1332-1405 PT Time Calculation (min) (ACUTE ONLY): 33 min   Charges:   PT Evaluation $PT Re-evaluation: 1 Re-eval PT Treatments $Therapeutic Activity: 8-22 mins        Shandra Szymborski, PT, GCS 10/28/20,2:44 PM

## 2020-10-28 NOTE — Progress Notes (Signed)
[name], male   DOB: [birth year], 55 y.o.   MRN: [name] Patient is postoperative day 1 left transtibial amputation there is no drainage in the wound VAC canister there is a good suction fit.  Anticipate discharge to inpatient versus outpatient rehab.

## 2020-10-28 NOTE — Evaluation (Signed)
Occupational Therapy Re-Evaluation Patient Details Name: Keith [name] MRN: [id number] DOB: 10/10/1965 Today's Date: 10/28/2020    History of Present Illness Keith [name] is a 55 y.o. male  who is sent to the ED by his orthopedic surgery provider due to concern for left heel ulcer that had failed outpatient therapy. with medical history significant for HIV, CKD 3B, type 2 diabetes, hypertension, hyperlipidemia, coronary artery disease, status post right below the knee amputation, now s/p L BKA 10/27/20.   Clinical Impression   Pt seen today for re-evaluation s/p L BKA. Pt presents with increased pain and weakness, however motivated to stand and work on transfers. Pt requires mod A +2 to stand and is unable to pivot at  this time. With transfer board, pt can complete transfers to drop arm BSC and chairs with min A +2. Pt continues to have good sitting balance, requiring less assist with seated ADL's. Pt will benefit from skilled intensive therapies at this time to maximize his return to independence. OT will follow acutely.     Follow Up Recommendations  SNF    Equipment Recommendations  None recommended by OT    Recommendations for Other Services       Precautions / Restrictions Precautions Precautions: Fall Precaution Comments: B BKA, prosthesis for Right Required Braces or Orthoses: Other Brace Other Brace: limb protector on left Restrictions Other Position/Activity Restrictions: NWB on left      Mobility Bed Mobility Overal bed mobility: Modified Independent             General bed mobility comments: HOB elevated    Transfers Overall transfer level: Needs assistance Equipment used: Sliding board;Rolling walker (2 wheeled) Transfers: Sit to/from Stand;Lateral/Scoot Transfers Sit to Stand: Mod assist;+2 physical assistance;From elevated surface        Lateral/Scoot Transfers: Min assist;+2 physical assistance;+2 safety/equipment;With slide board General transfer  comment: mod A for power up and steadying in RW    Balance Overall balance assessment: Needs assistance Sitting-balance support: Bilateral upper extremity supported;Feet unsupported Sitting balance-Leahy Scale: Fair Sitting balance - Comments: Pt able to don shrinker   Standing balance support: Bilateral upper extremity supported;During functional activity Standing balance-Leahy Scale: Poor Standing balance comment: reliant on bil UE support with standing                           ADL either performed or assessed with clinical judgement   ADL Overall ADL's : Needs assistance/impaired Eating/Feeding: Set up;Sitting   Grooming: Set up;Sitting   Upper Body Bathing: Sitting;Set up;Supervision/ safety   Lower Body Bathing: Moderate assistance;Sitting/lateral leans   Upper Body Dressing : Set up;Sitting   Lower Body Dressing: Moderate assistance;Sitting/lateral leans   Toilet Transfer: Minimal assistance;+2 for safety/equipment;+2 for physical assistance;Transfer board   Toileting- Clothing Manipulation and Hygiene: Moderate assistance;Sitting/lateral lean   Tub/ Shower Transfer: Minimal assistance;+2 for physical assistance;+2 for safety/equipment;Transfer board   Functional mobility during ADLs: Maximal assistance;+2 for physical assistance;+2 for safety/equipment;Rolling walker General ADL Comments: Pt does well with seated ADL's. All LB ADL's pt needs increased assist due to pain and weakness, as well as inability to stand.     Vision Baseline Vision/History: Wears glasses Wears Glasses: Reading only Patient Visual Report: No change from baseline Vision Assessment?: No apparent visual deficits     Perception Perception Perception Tested?: No   Praxis Praxis Praxis tested?: Not tested    Pertinent Vitals/Pain Pain Assessment: 0-10 Pain Score: 8  Pain  Location: L LE Pain Descriptors / Indicators: Discomfort;Sore Pain Intervention(s): Monitored during  session;Limited activity within patient's tolerance;Repositioned     Hand Dominance Right   Extremity/Trunk Assessment Upper Extremity Assessment Upper Extremity Assessment: Overall WFL for tasks assessed   Lower Extremity Assessment Lower Extremity Assessment: Defer to PT evaluation LLE: Unable to fully assess due to pain LLE Coordination: decreased gross motor   Cervical / Trunk Assessment Cervical / Trunk Assessment: Normal   Communication Communication Communication: No difficulties   Cognition Arousal/Alertness: Awake/alert Behavior During Therapy: WFL for tasks assessed/performed Overall Cognitive Status: No family/caregiver present to determine baseline cognitive functioning Area of Impairment: Safety/judgement                       Following Commands: Follows multi-step commands with increased time;Follows one step commands consistently Safety/Judgement: Decreased awareness of safety   Problem Solving: Requires verbal cues;Requires tactile cues     General Comments  VSS on RA, pt quickly fatigues andbecomes short of breath    Exercises     Shoulder Instructions      Home Living Family/patient expects to be discharged to:: Skilled nursing facility Living Arrangements: Parent Available Help at Discharge: Family;Available PRN/intermittently Type of Home: House Home Access: Stairs to enter [employer] of Steps: 3 Entrance Stairs-Rails: Right;Left Home Layout: One level     Bathroom Shower/Tub: Teacher, early years/pre: Standard Bathroom Accessibility: Yes How Accessible: Accessible via walker Home Equipment: [place] - 2 wheels;Shower seat          Prior Functioning/Environment Level of Independence: Independent with assistive device(s)        Comments: uses prosthetic and mobilizes with walker.        OT Problem List: Decreased strength;Decreased activity tolerance;Impaired balance (sitting and/or  standing);Decreased safety awareness;Decreased knowledge of use of DME or AE;Decreased knowledge of precautions;Obesity;Pain      OT Treatment/Interventions: Self-care/ADL training;Therapeutic exercise;DME and/or AE instruction;Therapeutic activities;Patient/family education;Balance training    OT Goals(Current goals can be found in the care plan section) Acute Rehab OT Goals Patient Stated Goal: to get left prosthetic, to walk again OT Goal Formulation: With patient Time For Goal Achievement: 11/11/20 Potential to Achieve Goals: Good ADL Goals Pt Will Perform Lower Body Bathing: with min assist;sitting/lateral leans Pt Will Perform Lower Body Dressing: with min assist;sitting/lateral leans Pt Will Transfer to Toilet: with min assist;stand pivot transfer  OT Frequency: Min 2X/week   Barriers to D/C:            Co-evaluation PT/OT/SLP Co-Evaluation/Treatment: Yes Reason for Co-Treatment: For patient/therapist safety;To address functional/ADL transfers PT goals addressed during session: Mobility/safety with mobility;Balance;Proper use of DME OT goals addressed during session: ADL's and self-care;Proper use of Adaptive equipment and DME      AM-PAC OT "6 Clicks" Daily Activity     Outcome Measure Help from another person eating meals?: None Help from another person taking care of personal grooming?: A Little Help from another person toileting, which includes using toliet, bedpan, or urinal?: A Lot Help from another person bathing (including washing, rinsing, drying)?: A Lot Help from another person to put on and taking off regular upper body clothing?: A Little Help from another person to put on and taking off regular lower body clothing?: A Lot 6 Click Score: 16   End of Session Equipment Utilized During Treatment: Gait belt;Rolling walker Nurse Communication: Patient requests pain meds  Activity Tolerance: Patient tolerated treatment well;Patient limited by fatigue Patient  left: in  chair;with chair alarm set;with call bell/phone within reach  OT Visit Diagnosis: Other abnormalities of gait and mobility (R26.89);Muscle weakness (generalized) (M62.81);Pain Pain - Right/Left: Left Pain - part of body: Leg                Time: 1331-1405 OT Time Calculation (min): 34 min Charges:  OT General Charges $OT Visit: 1 Visit OT Evaluation $OT Re-eval: 1 Re-eval  Lindzie Boxx H., OTR/L Acute Rehabilitation  Melisha Eggleton Elane Yolanda Bonine 10/28/2020, 4:47 PM

## 2020-10-28 NOTE — Plan of Care (Signed)
  Problem: Pain Managment: Goal: General experience of comfort will improve Outcome: Progressing   

## 2020-10-28 NOTE — Progress Notes (Addendum)
PROGRESS NOTE  Keith [name] [id number] DOB: 12-11-1965 DOA: 10/17/2020 [name], NP  HPI/Recap of past 24 hours:  This is a 55 year old male with past medical history of HIV, chronic kidney disease stage IIIb, type 2 diabetes mellitus, hypertension, hyperlipidemia, coronary artery disease, status post right below knee amputation who was sent to the emergency department by his orthopedic surgeon due to concern for left heel ulcer that has failed outpatient therapy  October 28, 2020 Patient seen and examined at bedside he is doing very well very jovial and making jokes.  But also complaining of some pain in the left lower extremity.  Patient underwent left BKA October 27, 2020.  Assessment/Plan: Active Problems:   Elevated blood pressure reading with diagnosis of hypertension   Cellulitis   Ulcer of heel due to diabetes mellitus (HCC)   Renal insufficiency   Normochromic normocytic anemia   Cellulitis of left foot   Acute osteomyelitis of left calcaneus ([place]) #1 left foot ulcer with concerning for cellulitis POA Patient failed outpatient therapy with doxycycline Continue current antibiotics Patient underwent left BKA October 27, 2020 Orthopedic following Wound care following  2.  HIV stable Continue HAART regimen  3.  Chronic kidney disease stage IIIb at baseline  4.  Diabetic nephropathy/diabetic neuropathy  5.  Severely uncontrolled diabetes mellitus with hyperglycemia last hemoglobin A1c was 14 Continue sliding scale Lantus is on hold Continue to encourage dietary compliance  6.  Ambulatory dysfunction secondary to bilateral amputation PT OT following    Code Status: Full  Severity of Illness: The appropriate patient status for this patient is INPATIENT. Inpatient status is judged to be reasonable and necessary in order to provide the required intensity of service to ensure the patient's safety. The patient's presenting symptoms, physical exam findings, and  initial radiographic and laboratory data in the context of their chronic comorbidities is felt to place them at high risk for further clinical deterioration. Furthermore, it is not anticipated that the patient will be medically stable for discharge from the hospital within 2 midnights of admission. The following factors support the patient status of inpatient.   " Recent right above knee amputation  * I certify that at the point of admission it is my clinical judgment that the patient will require inpatient hospital care spanning beyond 2 midnights from the point of admission due to high intensity of service, high risk for further deterioration and high frequency of surveillance required.*   Family Communication: Patient  Disposition Plan:  Status is: Inpatient   Dispo: The patient is from: Home              Anticipated d/c is to: Likely discharge to SNF              Anticipated d/c date is: 48 to 72 hours              Patient currently not medically stable for discharge  Consultants:  Orthopedic  Procedures: Left below-knee- amputation  Antimicrobials: Cefazolin  DVT prophylaxis: Lovenox   Objective: Vitals:   10/27/20 1703 10/27/20 1732 10/27/20 2121 10/28/20 0613  BP: 112/78 127/83 (!) 146/91 131/83  Pulse: 65 68 66 73  Resp: '18 20 15 19  '$ Temp: (!) 97.2 F (36.2 C) 97.7 F (36.5 C) 97.9 F (36.6 C) 97.6 F (36.4 C)  TempSrc:  Oral Oral Oral  SpO2: 100% 100% 100% 100%  Weight:      Height:  Intake/Output Summary (Last 24 hours) at 10/28/2020 0928 Last data filed at 10/28/2020 [id number] Gross per 24 hour  Intake 1252.06 ml  Output 960 ml  Net 292.06 ml   Filed Weights   10/20/20 2107 10/22/20 0525 10/26/20 2221  Weight: 128 kg 124.8 kg 124.8 kg   Body mass index is 39.48 kg/m.  Exam:  General: 55 y.o. year-old male well developed well nourished in no acute distress.  Alert and oriented x3.  Obese Cardiovascular: Regular rate and rhythm with no rubs or  gallops.  No thyromegaly or JVD noted.   Respiratory: Clear to auscultation with no wheezes or rales. Good inspiratory effort. Abdomen: Soft nontender nondistended with normal bowel sounds x4 quadrants. Musculoskeletal: Bilateral amputation Skin: No ulcerative lesions noted or rashes, Psychiatry: Mood is appropriate for condition and setting Neurology:    Data Reviewed: CBC: Recent Labs  Lab 10/23/20 0122 10/24/20 0457 10/25/20 0534 10/27/20 0505 10/28/20 0424  WBC 8.6 8.4 7.8 11.0* 12.7*  HGB 10.7* 10.3* 10.4* 10.3* 10.5*  HCT 33.7* 32.8* 33.3* 32.8* 32.9*  MCV 84.0 84.5 83.5 84.1 84.4  PLT 371 381 427* 381 [id number]*   Basic Metabolic Panel: Recent Labs  Lab 10/22/20 0320 10/23/20 0122 10/24/20 0457 10/25/20 0534 10/27/20 0505 10/28/20 0424 10/28/20 0641  NA 132* 132* 132* 132* 133* 128*  --   K 4.7 4.5 4.8 4.4 4.8 6.3* 4.7  CL 101 103 102 100 102 100  --   CO2 25 21* '22 25 22 '$ 19*  --   GLUCOSE 249* 216* 222* 225* 163* 270*  --   BUN 19 19 25* 24* 30* 36*  --   CREATININE 1.97* 1.92* 2.14* 2.03* 2.14* 2.29*  --   CALCIUM 8.8* 8.9 9.1 9.1 9.6 9.0  --   MG 1.8 1.8 1.8 1.8  --   --   --    GFR: Estimated Creatinine Clearance: 48.3 mL/min (A) (by C-G formula based on SCr of 2.29 mg/dL (H)). Liver Function Tests: No results for input(s): AST, ALT, ALKPHOS, BILITOT, PROT, ALBUMIN in the last 168 hours. No results for input(s): LIPASE, AMYLASE in the last 168 hours. No results for input(s): AMMONIA in the last 168 hours. Coagulation Profile: No results for input(s): INR, PROTIME in the last 168 hours. Cardiac Enzymes: No results for input(s): CKTOTAL, CKMB, CKMBINDEX, TROPONINI in the last 168 hours. BNP (last 3 results) No results for input(s): PROBNP in the last 8760 hours. HbA1C: No results for input(s): HGBA1C in the last 72 hours. CBG: Recent Labs  Lab 10/27/20 1340 10/27/20 1656 10/27/20 1728 10/27/20 2119 10/28/20 0640  GLUCAP 101* 86 98 396* 254*    Lipid Profile: No results for input(s): CHOL, HDL, LDLCALC, TRIG, CHOLHDL, LDLDIRECT in the last 72 hours. Thyroid Function Tests: No results for input(s): TSH, T4TOTAL, FREET4, T3FREE, THYROIDAB in the last 72 hours. Anemia Panel: No results for input(s): VITAMINB12, FOLATE, FERRITIN, TIBC, IRON, RETICCTPCT in the last 72 hours. Urine analysis:    Component Value Date/Time   COLORURINE STRAW (A) 10/07/2020 1405   APPEARANCEUR CLEAR 10/07/2020 1405   LABSPEC 1.016 10/07/2020 1405   PHURINE 7.0 10/07/2020 1405   GLUCOSEU >=500 (A) 10/07/2020 1405   HGBUR NEGATIVE 10/07/2020 1405   BILIRUBINUR NEGATIVE 10/07/2020 1405   BILIRUBINUR negative 09/07/2018 1034   KETONESUR NEGATIVE 10/07/2020 1405   PROTEINUR 100 (A) 10/07/2020 1405   UROBILINOGEN 1.0 09/07/2018 1034   UROBILINOGEN 1.0 01/01/2017 1032   NITRITE NEGATIVE 10/07/2020 1405   LEUKOCYTESUR NEGATIVE  10/07/2020 1405   Sepsis Labs: '@LABRCNTIP'$ (procalcitonin:4,lacticidven:4)  ) Recent Results (from the past 240 hour(s))  Surgical pcr screen     Status: None   Collection Time: 10/27/20  9:52 AM   Specimen: Nasal Mucosa; Nasal Swab  Result Value Ref Range Status   MRSA, PCR NEGATIVE NEGATIVE Final   Staphylococcus aureus NEGATIVE NEGATIVE Final    Comment: (NOTE) The Xpert SA Assay (FDA approved for NASAL specimens in patients 1 years of age and older), is one component of a comprehensive surveillance program. It is not intended to diagnose infection nor to guide or monitor treatment. Performed at [facility], [place] [address], [place],  [address]       Studies: No results found.  Scheduled Meds:  allopurinol  300 mg Oral Daily   amLODipine  10 mg Oral Daily   vitamin C  1,000 mg Oral Daily   aspirin EC  81 mg Oral Daily   atorvastatin  80 mg Oral Daily   bictegravir-emtricitabine-tenofovir AF  1 tablet Oral Daily   carvedilol  25 mg Oral BID WC   dapsone  100 mg Oral Daily   escitalopram  20 mg  Oral Daily   feeding supplement  296 mL Oral Once   gabapentin  100 mg Oral TID   icosapent Ethyl  2 g Oral BID   insulin aspart  0-15 Units Subcutaneous TID WC   insulin aspart  0-5 Units Subcutaneous QHS   insulin glargine  35 Units Subcutaneous Daily   linezolid  600 mg Oral Q12H   lisinopril  5 mg Oral Daily   metroNIDAZOLE  500 mg Oral Q12H   nutrition supplement (JUVEN)  1 packet Oral BID BM   oxybutynin  2.5 mg Oral BID   pantoprazole  40 mg Oral Daily   ticagrelor  90 mg Oral BID   zinc sulfate  220 mg Oral Daily    Continuous Infusions:  sodium chloride     ceFEPime (MAXIPIME) IV 2 g (10/28/20 0516)     LOS: 10 days     Cristal Deer, MD [facility]  To reach me or the doctor on call, go to: [email or web address] Password [facility]  10/28/2020, 9:28 AM

## 2020-10-29 DIAGNOSIS — D649 Anemia, unspecified: Secondary | ICD-10-CM

## 2020-10-29 DIAGNOSIS — L97401 Non-pressure chronic ulcer of unspecified heel and midfoot limited to breakdown of skin: Secondary | ICD-10-CM

## 2020-10-29 DIAGNOSIS — E08621 Diabetes mellitus due to underlying condition with foot ulcer: Secondary | ICD-10-CM

## 2020-10-29 DIAGNOSIS — M86172 Other acute osteomyelitis, left ankle and foot: Secondary | ICD-10-CM | POA: Diagnosis not present

## 2020-10-29 DIAGNOSIS — I10 Essential (primary) hypertension: Secondary | ICD-10-CM | POA: Diagnosis not present

## 2020-10-29 DIAGNOSIS — N289 Disorder of kidney and ureter, unspecified: Secondary | ICD-10-CM | POA: Diagnosis not present

## 2020-10-29 LAB — CBC
HCT: 27.8 % — ABNORMAL LOW (ref 39.0–52.0)
Hemoglobin: 8.6 g/dL — ABNORMAL LOW (ref 13.0–17.0)
MCH: 26.3 pg (ref 26.0–34.0)
MCHC: 30.9 g/dL (ref 30.0–36.0)
MCV: 85 fL (ref 80.0–100.0)
Platelets: 355 10*3/uL (ref 150–400)
RBC: 3.27 MIL/uL — ABNORMAL LOW (ref 4.22–5.81)
RDW: 13.2 % (ref 11.5–15.5)
WBC: 13.7 10*3/uL — ABNORMAL HIGH (ref 4.0–10.5)
nRBC: 0 % (ref 0.0–0.2)

## 2020-10-29 LAB — BASIC METABOLIC PANEL
Anion gap: 9 (ref 5–15)
BUN: 48 mg/dL — ABNORMAL HIGH (ref 6–20)
CO2: 19 mmol/L — ABNORMAL LOW (ref 22–32)
Calcium: 8.5 mg/dL — ABNORMAL LOW (ref 8.9–10.3)
Chloride: 101 mmol/L (ref 98–111)
Creatinine, Ser: 2.83 mg/dL — ABNORMAL HIGH (ref 0.61–1.24)
GFR, Estimated: 26 mL/min — ABNORMAL LOW (ref >60)
Glucose, Bld: 283 mg/dL — ABNORMAL HIGH (ref 70–99)
Potassium: 4.8 mmol/L (ref 3.5–5.1)
Sodium: 129 mmol/L — ABNORMAL LOW (ref 135–145)

## 2020-10-29 LAB — GLUCOSE, CAPILLARY
Glucose-Capillary: 234 mg/dL — ABNORMAL HIGH (ref 70–99)
Glucose-Capillary: 189 mg/dL — ABNORMAL HIGH (ref 70–99)
Glucose-Capillary: 142 mg/dL — ABNORMAL HIGH (ref 70–99)
Glucose-Capillary: 108 mg/dL — ABNORMAL HIGH (ref 70–99)

## 2020-10-29 MED ORDER — OXYCODONE HCL 5 MG PO TABS
10.0000 mg | ORAL_TABLET | Freq: Four times a day (QID) | ORAL | Status: DC | PRN
  Administered 2020-10-29 – 2020-10-30 (×2): 10 mg via ORAL
  Filled 2020-10-29 (×2): qty 2

## 2020-10-29 NOTE — Progress Notes (Signed)
PROGRESS NOTE  Keith [name] [id number] DOB: [birth year] DOA: 10/17/2020 [name], NP  HPI/Recap of past 24 hours: This is a 55 year old male with past medical history of HIV, chronic kidney disease stage IIIb, type 2 diabetes mellitus, hypertension, hyperlipidemia, coronary artery disease, status post right below knee amputation who was sent to the emergency department by his orthopedic surgeon due to concern for left heel ulcer that has failed outpatient therapy.  October 28, 2020 Patient seen and examined at bedside he is doing very well very jovial and making jokes  October 29, 2020: Patient seen and examined at bedside  Assessment/Plan: Active Problems:   Elevated blood pressure reading with diagnosis of hypertension   Cellulitis   Ulcer of heel due to diabetes mellitus ([place])   Renal insufficiency   Normochromic normocytic anemia   Cellulitis of left foot   Acute osteomyelitis of left calcaneus ([place])  #1 left foot ulcer with concerning for cellulitis POA Patient failed outpatient therapy with doxycycline Continue current antibiotics Patient underwent left BKA October 27, 2020 Orthopedic following Wound care following  2.  HIV stable Continue HAART regimen  3.  Chronic kidney disease stage IIIb at baseline  4.  Diabetic nephropathy/diabetic neuropathy  5.  Severely uncontrolled diabetes mellitus with hyperglycemia last hemoglobin A1c was 14 Continue sliding scale Lantus is on hold Continue to encourage dietary compliance  6.  Ambulatory dysfunction secondary to bilateral amputation PT OT following  7.  Phantom pain.  Patient stated that he has had phantom pain from his previous amputation is having both admitted stable.  He is requesting increase of his pain medication I will increase his hydrocodone to every 6 hours as needed   Code Status: Full  Severity of Illness: The appropriate patient status for this patient is INPATIENT. Inpatient status is  judged to be reasonable and necessary in order to provide the required intensity of service to ensure the patient's safety. The patient's presenting symptoms, physical exam findings, and initial radiographic and laboratory data in the context of their chronic comorbidities is felt to place them at high risk for further clinical deterioration. Furthermore, it is not anticipated that the patient will be medically stable for discharge from the hospital within 2 midnights of admission. The following factors support the patient status of inpatient.   " Recent right above knee amputation  * I certify that at the point of admission it is my clinical judgment that the patient will require inpatient hospital care spanning beyond 2 midnights from the point of admission due to high intensity of service, high risk for further deterioration and high frequency of surveillance required.*   Family Communication: Patient  Disposition Plan:  Status is: Inpatient   Dispo: The patient is from: Home              Anticipated d/c is to: Likely discharge to SNF              Anticipated d/c date is: 48 to 72 hours              Patient currently not medically stable for discharge  Consultants:  Orthopedic  Procedures: Left below-knee- amputation  Antimicrobials: Cefazolin  DVT prophylaxis: Lovenox   Objective: Vitals:   10/28/20 1814 10/28/20 2053 10/29/20 0516 10/29/20 0900  BP: 109/73 120/80 119/78 118/81  Pulse: 69 70 74 72  Resp: '18 16 20 18  '$ Temp: 98 F (36.7 C) 97.8 F (36.6 C) 98.2 F (36.8 C) 97.9  F (36.6 C)  TempSrc: Oral Oral Oral Oral  SpO2: 97% 99% 95% 100%  Weight:      Height:        Intake/Output Summary (Last 24 hours) at 10/29/2020 1245 Last data filed at 10/29/2020 0929 Gross per 24 hour  Intake 900 ml  Output 950 ml  Net -50 ml    Filed Weights   10/20/20 2107 10/22/20 0525 10/26/20 2221  Weight: 128 kg 124.8 kg 124.8 kg   Body mass index is 39.48  kg/m.  Exam:  General: 55 y.o. year-old male well developed well nourished in no acute distress.  Alert and oriented x3.  Obese Cardiovascular: Regular rate and rhythm with no rubs or gallops.  No thyromegaly or JVD noted.   Respiratory: Clear to auscultation with no wheezes or rales. Good inspiratory effort. Abdomen: Soft nontender nondistended with normal bowel sounds x4 quadrants. Musculoskeletal: Bilateral amputation Skin: No ulcerative lesions noted or rashes, Psychiatry: Mood is appropriate for condition and setting Neurology:    Data Reviewed: CBC: Recent Labs  Lab 10/24/20 0457 10/25/20 0534 10/27/20 0505 10/28/20 0424 10/29/20 0322  WBC 8.4 7.8 11.0* 12.7* 13.7*  HGB 10.3* 10.4* 10.3* 10.5* 8.6*  HCT 32.8* 33.3* 32.8* 32.9* 27.8*  MCV 84.5 83.5 84.1 84.4 85.0  PLT 381 427* 381 405* [id number]    Basic Metabolic Panel: Recent Labs  Lab 10/23/20 0122 10/24/20 0457 10/25/20 0534 10/27/20 0505 10/28/20 0424 10/28/20 0641 10/29/20 0322  NA 132* 132* 132* 133* 128*  --  129*  K 4.5 4.8 4.4 4.8 6.3* 4.7 4.8  CL 103 102 100 102 100  --  101  CO2 21* '22 25 22 '$ 19*  --  19*  GLUCOSE 216* 222* 225* 163* 270*  --  283*  BUN 19 25* 24* 30* 36*  --  48*  CREATININE 1.92* 2.14* 2.03* 2.14* 2.29*  --  2.83*  CALCIUM 8.9 9.1 9.1 9.6 9.0  --  8.5*  MG 1.8 1.8 1.8  --   --   --   --     GFR: Estimated Creatinine Clearance: 39.1 mL/min (A) (by C-G formula based on SCr of 2.83 mg/dL (H)). Liver Function Tests: No results for input(s): AST, ALT, ALKPHOS, BILITOT, PROT, ALBUMIN in the last 168 hours. No results for input(s): LIPASE, AMYLASE in the last 168 hours. No results for input(s): AMMONIA in the last 168 hours. Coagulation Profile: No results for input(s): INR, PROTIME in the last 168 hours. Cardiac Enzymes: No results for input(s): CKTOTAL, CKMB, CKMBINDEX, TROPONINI in the last 168 hours. BNP (last 3 results) No results for input(s): PROBNP in the last 8760  hours. HbA1C: No results for input(s): HGBA1C in the last 72 hours. CBG: Recent Labs  Lab 10/28/20 1134 10/28/20 1708 10/28/20 2214 10/29/20 0642 10/29/20 1145  GLUCAP 277* 234* 262* 234* 189*    Lipid Profile: No results for input(s): CHOL, HDL, LDLCALC, TRIG, CHOLHDL, LDLDIRECT in the last 72 hours. Thyroid Function Tests: No results for input(s): TSH, T4TOTAL, FREET4, T3FREE, THYROIDAB in the last 72 hours. Anemia Panel: No results for input(s): VITAMINB12, FOLATE, FERRITIN, TIBC, IRON, RETICCTPCT in the last 72 hours. Urine analysis:    Component Value Date/Time   COLORURINE STRAW (A) 10/07/2020 1405   APPEARANCEUR CLEAR 10/07/2020 1405   LABSPEC 1.016 10/07/2020 1405   PHURINE 7.0 10/07/2020 1405   GLUCOSEU >=500 (A) 10/07/2020 1405   HGBUR NEGATIVE 10/07/2020 [place] 10/07/2020 1405   BILIRUBINUR negative 09/07/2018  [place] 10/07/2020 1405   PROTEINUR 100 (A) 10/07/2020 1405   UROBILINOGEN 1.0 09/07/2018 1034   UROBILINOGEN 1.0 01/01/2017 1032   NITRITE NEGATIVE 10/07/2020 1405   LEUKOCYTESUR NEGATIVE 10/07/2020 1405   Sepsis Labs: '@LABRCNTIP'$ (procalcitonin:4,lacticidven:4)  ) Recent Results (from the past 240 hour(s))  Surgical pcr screen     Status: None   Collection Time: 10/27/20  9:52 AM   Specimen: Nasal Mucosa; Nasal Swab  Result Value Ref Range Status   MRSA, PCR NEGATIVE NEGATIVE Final   Staphylococcus aureus NEGATIVE NEGATIVE Final    Comment: (NOTE) The Xpert SA Assay (FDA approved for NASAL specimens in patients 70 years of age and older), is one component of a comprehensive surveillance program. It is not intended to diagnose infection nor to guide or monitor treatment. Performed at [facility], [place] [address] [place], [place] [address]       Studies: No results found.  Scheduled Meds:  allopurinol  300 mg Oral Daily   amLODipine  10 mg Oral Daily   vitamin C  1,000 mg Oral Daily    aspirin EC  81 mg Oral Daily   atorvastatin  80 mg Oral Daily   bictegravir-emtricitabine-tenofovir AF  1 tablet Oral Daily   carvedilol  25 mg Oral BID WC   dapsone  100 mg Oral Daily   escitalopram  20 mg Oral Daily   feeding supplement  296 mL Oral Once   gabapentin  100 mg Oral TID   icosapent Ethyl  2 g Oral BID   insulin aspart  0-15 Units Subcutaneous TID WC   insulin aspart  0-5 Units Subcutaneous QHS   insulin glargine  35 Units Subcutaneous Daily   nutrition supplement (JUVEN)  1 packet Oral BID BM   oxybutynin  2.5 mg Oral BID   pantoprazole  40 mg Oral Daily   ticagrelor  90 mg Oral BID   zinc sulfate  220 mg Oral Daily    Continuous Infusions:     LOS: 11 days     Cristal Deer, MD [facility]  To reach me or the doctor on call, go to: [email or web address] Password [facility]  10/29/2020, 12:45 PM

## 2020-10-30 ENCOUNTER — Other Ambulatory Visit: Payer: Self-pay | Admitting: *Deleted

## 2020-10-30 DIAGNOSIS — M86172 Other acute osteomyelitis, left ankle and foot: Secondary | ICD-10-CM | POA: Diagnosis not present

## 2020-10-30 LAB — CBC
HCT: 27.1 % — ABNORMAL LOW (ref 39.0–52.0)
Hemoglobin: 8.4 g/dL — ABNORMAL LOW (ref 13.0–17.0)
MCH: 26.1 pg (ref 26.0–34.0)
MCHC: 31 g/dL (ref 30.0–36.0)
MCV: 84.2 fL (ref 80.0–100.0)
Platelets: 376 10*3/uL (ref 150–400)
RBC: 3.22 MIL/uL — ABNORMAL LOW (ref 4.22–5.81)
RDW: 13.3 % (ref 11.5–15.5)
WBC: 11.9 10*3/uL — ABNORMAL HIGH (ref 4.0–10.5)
nRBC: 0 % (ref 0.0–0.2)

## 2020-10-30 LAB — GLUCOSE, CAPILLARY
Glucose-Capillary: 88 mg/dL (ref 70–99)
Glucose-Capillary: 118 mg/dL — ABNORMAL HIGH (ref 70–99)
Glucose-Capillary: 125 mg/dL — ABNORMAL HIGH (ref 70–99)
Glucose-Capillary: 105 mg/dL — ABNORMAL HIGH (ref 70–99)

## 2020-10-30 LAB — BASIC METABOLIC PANEL
Anion gap: 7 (ref 5–15)
BUN: 54 mg/dL — ABNORMAL HIGH (ref 6–20)
CO2: 21 mmol/L — ABNORMAL LOW (ref 22–32)
Calcium: 8.8 mg/dL — ABNORMAL LOW (ref 8.9–10.3)
Chloride: 103 mmol/L (ref 98–111)
Creatinine, Ser: 2.99 mg/dL — ABNORMAL HIGH (ref 0.61–1.24)
GFR, Estimated: 24 mL/min — ABNORMAL LOW (ref >60)
Glucose, Bld: 90 mg/dL (ref 70–99)
Potassium: 4.9 mmol/L (ref 3.5–5.1)
Sodium: 131 mmol/L — ABNORMAL LOW (ref 135–145)

## 2020-10-30 MED ORDER — HEPARIN SODIUM (PORCINE) 5000 UNIT/ML IJ SOLN
5000.0000 [IU] | Freq: Three times a day (TID) | INTRAMUSCULAR | Status: DC
  Administered 2020-10-30 – 2020-11-09 (×28): 5000 [IU] via SUBCUTANEOUS
  Filled 2020-10-30 (×26): qty 1

## 2020-10-30 NOTE — TOC Progression Note (Signed)
Transition of Care [facility]) - Progression Note    Patient Details  Name: CALLIN GRISE MRN: [id number] Date of Birth: June 27, 1965  Transition of Care [facility]) CM/SW [place], [place] Phone Number: 10/30/2020, 1:39 PM  Clinical Narrative:    Pt has no bed offerings at this time.   Expected Discharge Plan: Skilled Nursing Facility Barriers to Discharge: SNF Pending bed offer  Expected Discharge Plan and Services Expected Discharge Plan: [place] In-house Referral: Clinical Social Work Discharge Planning Services: CM Consult Post Acute Care Choice: [place] arrangements for the past 2 months: Single Family Home                                       Social Determinants of Health (SDOH) Interventions    Readmission Risk Interventions Readmission Risk Prevention Plan 10/21/2020 11/13/2018 11/13/2018  Transportation Screening Complete - Complete  PCP or Specialist Appt within 3-5 Days - - -  [place]RI or [place] Work Consult for [place] - - -  Medication Review Press photographer) Complete - Complete  PCP or Specialist appointment within 3-5 days of discharge Complete Complete -  [place] or Home Care Consult Complete Complete -  SW Recovery Care/Counseling Consult Complete Complete -  Palliative Care Screening Not Applicable Not Applicable -  [place] Complete Not Applicable -  Some encounter information is confidential and restricted. Go to Review Flowsheets activity to see all data.  Some recent data might be hidden

## 2020-10-30 NOTE — Progress Notes (Addendum)
PROGRESS NOTE    Keith [name]  [id number] DOB: [birth year] DOA: 10/17/2020 [name], NP   Chief Complain:left heel ulcer  Brief Narrative:  Patient is a  55 year old male with past medical history of HIV, chronic kidney disease stage IIIb, type 2 diabetes mellitus, hypertension, hyperlipidemia, coronary artery disease, status post right below knee amputation who was sent to the emergency department by his orthopedic surgeon due to concern for left heel ulcer that has failed outpatient therapy.  MRI confirmed osteomyelitis.  He underwent left transtibial amputation by [name].  PT/OT recommended skilled subsequent discharge.  TOC consulted.  Patient is medically stable for discharge to skilled nursing facility as soon as bed is available.  Assessment & Plan:   Active Problems:   Elevated blood pressure reading with diagnosis of hypertension   Cellulitis   Ulcer of heel due to diabetes mellitus (H[facility])   Renal insufficiency   Normochromic normocytic anemia   Cellulitis of left foot   Acute osteomyelitis of left calcaneus ([place])   1 left foot ulcer Patient failed outpatient therapy with doxycycline Osteomyelitis was confirmed by MRI. Patient underwent left BKA October 27, 2020 Orthopedic were following, recommend outpatient follow-up.  2.  HIV stable Continue HAART regimen  3.  Chronic kidney disease stage IIIb  Currently at baseline  4.  Diabetic nephropathy/diabetic neuropathy Continue home medications, on gabapentin  5.  Severely uncontrolled diabetes mellitus with hyperglycemia  last hemoglobin A1c was 14 Continue current insulin regimen.  6.  Ambulatory dysfunction secondary to bilateral amputation PT OT following.  Recommended skilled nursing facility on discharge.  TOC consulted  7.  Hypertension Continue current medications.  Blood pressure stable.  Continue to monitor  8.  Normocytic anemia Hemoglobin currently stable in the range of 8.  Most likely  associated  with chronic kidney disease.         DVT prophylaxis:Heparin [place] Code Status: Full Family Communication: None at bedside Status is: Inpatient  Remains inpatient appropriate because:Unsafe d/c plan  Dispo:  Patient From: Home  Planned Disposition: [place]  Medically stable for discharge: No      Consultants: Orthopedics  Procedures:Left BKA  Antimicrobials:  Anti-infectives (From admission, onward)    Start     Dose/Rate Route Frequency Ordered Stop   10/27/20 1245  ceFAZolin (ANCEF) IVPB 3g/100 mL premix        3 g 200 mL/hr over 30 Minutes Intravenous On call to O.R. 10/27/20 0755 10/27/20 1608   10/20/20 1415  linezolid (ZYVOX) tablet 600 mg  Status:  Discontinued        600 mg Oral Every 12 hours 10/20/20 1326 10/28/20 1115   10/19/20 1445  metroNIDAZOLE (FLAGYL) tablet 500 mg  Status:  Discontinued        500 mg Oral Every 12 hours 10/19/20 1347 10/28/20 1115   10/18/20 1600  ceFEPIme (MAXIPIME) 2 g in sodium chloride 0.9 % 100 mL IVPB  Status:  Discontinued        2 g 200 mL/hr over 30 Minutes Intravenous Every 12 hours 10/18/20 0414 10/28/20 1112   10/18/20 1000  dapsone tablet 100 mg        100 mg Oral Daily 10/18/20 0425     10/18/20 1000  bictegravir-emtricitabine-tenofovir AF (BIKTARVY) 50-200-25 MG per tablet 1 tablet        1 tablet Oral Daily 10/18/20 0610     10/18/20 0415  metroNIDAZOLE (FLAGYL) IVPB 500 mg  Status:  Discontinued  500 mg 100 mL/hr over 60 Minutes Intravenous Every 8 hours 10/18/20 0404 10/19/20 1347   10/18/20 0415  ceFEPIme (MAXIPIME) 2 g in sodium chloride 0.9 % 100 mL IVPB        2 g 200 mL/hr over 30 Minutes Intravenous  Once 10/18/20 0414 10/18/20 0500   10/18/20 0345  vancomycin (VANCOREADY) IVPB 2000 mg/400 mL        2,000 mg 200 mL/hr over 120 Minutes Intravenous  Once 10/18/20 0335 10/18/20 [id number]       Subjective:  Patient seen and examined at bedside this afternoon.  Hemodynamically  stable.  Lying in bed.  Complains of pain on the operated side.  Denied any other complains  Objective: Vitals:   10/29/20 1713 10/29/20 2224 10/30/20 0513 10/30/20 0841  BP: (!) 93/54 122/65 140/81 118/79  Pulse: 69 74 72 72  Resp: '18 16 16 16  '$ Temp: 98.4 F (36.9 C) 98.5 F (36.9 C) 98.2 F (36.8 C) 98.3 F (36.8 C)  TempSrc: Oral Oral Oral Oral  SpO2: 98% 99% 94% 100%  Weight:      Height:        Intake/Output Summary (Last 24 hours) at 10/30/2020 1327 Last data filed at 10/30/2020 1127 Gross per 24 hour  Intake 240 ml  Output 950 ml  Net -710 ml   Filed Weights   10/20/20 2107 10/22/20 0525 10/26/20 2221  Weight: 128 kg 124.8 kg 124.8 kg    Examination:  General exam: Overall comfortable, not in distress, morbidly obese HEENT: PERRL Respiratory system:  no wheezes or crackles  Cardiovascular system: S1 & S2 heard, RRR.  Gastrointestinal system: Abdomen is nondistended, soft and nontender. Central nervous system: Alert and oriented Extremities: Right BKA ,left BKA with wound VAC Skin: No rashes, no ulcers,no icterus      Data Reviewed: I have personally reviewed following labs and imaging studies  CBC: Recent Labs  Lab 10/25/20 0534 10/27/20 0505 10/28/20 0424 10/29/20 0322 10/30/20 0715  WBC 7.8 11.0* 12.7* 13.7* 11.9*  HGB 10.4* 10.3* 10.5* 8.6* 8.4*  HCT 33.3* 32.8* 32.9* 27.8* 27.1*  MCV 83.5 84.1 84.4 85.0 84.2  PLT 427* 381 405* 355 [id number]   Basic Metabolic Panel: Recent Labs  Lab 10/24/20 0457 10/25/20 0534 10/27/20 0505 10/28/20 0424 10/28/20 0641 10/29/20 0322 10/30/20 0715  NA 132* 132* 133* 128*  --  129* 131*  K 4.8 4.4 4.8 6.3* 4.7 4.8 4.9  CL 102 100 102 100  --  101 103  CO2 '22 25 22 '$ 19*  --  19* 21*  GLUCOSE 222* 225* 163* 270*  --  283* 90  BUN 25* 24* 30* 36*  --  48* 54*  CREATININE 2.14* 2.03* 2.14* 2.29*  --  2.83* 2.99*  CALCIUM 9.1 9.1 9.6 9.0  --  8.5* 8.8*  MG 1.8 1.8  --   --   --   --   --    GFR: Estimated  Creatinine Clearance: 37 mL/min (A) (by C-G formula based on SCr of 2.99 mg/dL (H)). Liver Function Tests: No results for input(s): AST, ALT, ALKPHOS, BILITOT, PROT, ALBUMIN in the last 168 hours. No results for input(s): LIPASE, AMYLASE in the last 168 hours. No results for input(s): AMMONIA in the last 168 hours. Coagulation Profile: No results for input(s): INR, PROTIME in the last 168 hours. Cardiac Enzymes: No results for input(s): CKTOTAL, CKMB, CKMBINDEX, TROPONINI in the last 168 hours. BNP (last 3 results) No results for input(s): PROBNP  in the last 8760 hours. HbA1C: No results for input(s): HGBA1C in the last 72 hours. CBG: Recent Labs  Lab 10/29/20 1145 10/29/20 1648 10/29/20 2234 10/30/20 0719 10/30/20 1209  GLUCAP 189* 142* 108* 88 118*   Lipid Profile: No results for input(s): CHOL, HDL, LDLCALC, TRIG, CHOLHDL, LDLDIRECT in the last 72 hours. Thyroid Function Tests: No results for input(s): TSH, T4TOTAL, FREET4, T3FREE, THYROIDAB in the last 72 hours. Anemia Panel: No results for input(s): VITAMINB12, FOLATE, FERRITIN, TIBC, IRON, RETICCTPCT in the last 72 hours. Sepsis Labs: No results for input(s): PROCALCITON, LATICACIDVEN in the last 168 hours.  Recent Results (from the past 240 hour(s))  Surgical pcr screen     Status: None   Collection Time: 10/27/20  9:52 AM   Specimen: Nasal Mucosa; Nasal Swab  Result Value Ref Range Status   MRSA, PCR NEGATIVE NEGATIVE Final   Staphylococcus aureus NEGATIVE NEGATIVE Final    Comment: (NOTE) The Xpert SA Assay (FDA approved for NASAL specimens in patients 23 years of age and older), is one component of a comprehensive surveillance program. It is not intended to diagnose infection nor to guide or monitor treatment. Performed at [facility], [place] [address] [place], [place] [address]          Radiology Studies: No results found.      Scheduled Meds:  allopurinol  300 mg Oral Daily   amLODipine   10 mg Oral Daily   vitamin C  1,000 mg Oral Daily   aspirin EC  81 mg Oral Daily   atorvastatin  80 mg Oral Daily   bictegravir-emtricitabine-tenofovir AF  1 tablet Oral Daily   carvedilol  25 mg Oral BID WC   dapsone  100 mg Oral Daily   escitalopram  20 mg Oral Daily   feeding supplement  296 mL Oral Once   gabapentin  100 mg Oral TID   icosapent Ethyl  2 g Oral BID   insulin aspart  0-15 Units Subcutaneous TID WC   insulin aspart  0-5 Units Subcutaneous QHS   insulin glargine  35 Units Subcutaneous Daily   nutrition supplement (JUVEN)  1 packet Oral BID BM   oxybutynin  2.5 mg Oral BID   pantoprazole  40 mg Oral Daily   ticagrelor  90 mg Oral BID   zinc sulfate  220 mg Oral Daily   Continuous Infusions:   LOS: 12 days    Time spent: 25 mins.More than 50% of that time was spent in counseling and/or coordination of care.      Shelly Coss, MD [facility] P7/25/2022, 1:27 PM

## 2020-10-30 NOTE — Progress Notes (Signed)
Physical Therapy Treatment Patient Details Name: Keith [name] MRN: [name] DOB: [birth year] Today's Date: 10/30/2020    History of Present Illness Keith [name] is a 55 y.o. male  who is sent to the ED by his orthopedic surgery provider due to concern for left heel ulcer that had failed outpatient therapy. with medical history significant for HIV, CKD 3B, type 2 diabetes, hypertension, hyperlipidemia, coronary artery disease, status post right below the knee amputation, now s/p L BKA 10/27/20.    PT Comments    Pt supine in bed, agreeable to therapy as he has just received pain medication. Pt able to come to EoB and don shrinker on R LE, assist provided for prosthetic sock placement.  Pt attempted to seat R LE prosthetic in supine by pushing on footboard, despite assist in alignment unable to achieve. Replaced shrinker x2 and still unable to don prosthetic. Sat EoB and tried to push down through knee. Provided maximal encouragement for lateral scoot transfer to drop arm recliner however refuses citing fear of falling. After session PT contacted Octavio Graves at [facility] to stop by and troubleshoot with pt how to successfully don prosthesis for transfers. Pt goals reassessed and downgraded due to L LE BKA. D/c plans remain appropriate.    Follow Up Recommendations  SNF     Equipment Recommendations  Wheelchair (measurements PT);Wheelchair cushion (measurements PT)       Precautions / Restrictions Precautions Precautions: Fall;Other (comment) (Wound Vac L LE) Precaution Comments: B BKA, prosthesis for Right Required Braces or Orthoses: Other Brace Other Brace: limb protector on left Restrictions Other Position/Activity Restrictions: NWB on left    Mobility  Bed Mobility Overal bed mobility: Modified Independent             General bed mobility comments: HOB elevated    Transfers Overall transfer level: Needs assistance Equipment used: Sliding board;Rolling walker (2  wheeled) Transfers: Sit to/from Stand;Lateral/Scoot Transfers Sit to Stand: Mod assist;+2 physical assistance;From elevated surface        Lateral/Scoot Transfers: Min assist;+2 physical assistance;+2 safety/equipment;With slide board General transfer comment: unable to attempt slideboard transfer due to inability to don R LE prosthetic, pt reports fear of falling as to why he can not perform lateral scoot transfer  Ambulation/Gait             General Gait Details: unable        Balance Overall balance assessment: Needs assistance Sitting-balance support: Bilateral upper extremity supported;Feet unsupported Sitting balance-Leahy Scale: Fair Sitting balance - Comments: Pt able to don shrinker                                    Cognition Arousal/Alertness: Awake/alert Behavior During Therapy: WFL for tasks assessed/performed Overall Cognitive Status: No family/caregiver present to determine baseline cognitive functioning Area of Impairment: Safety/judgement                       Following Commands: Follows multi-step commands with increased time;Follows one step commands consistently Safety/Judgement: Decreased awareness of safety   Problem Solving: Requires verbal cues;Requires tactile cues General Comments: decreased safety awareness as to why he can not stand up to seat his R prosthetic in standing like he is accustom to.         General Comments General comments (skin integrity, edema, etc.): VSS on RA, called [employer] for prothotist to come and problem solve seating  prosthetic in supine      Pertinent Vitals/Pain Pain Assessment: Faces Faces Pain Scale: Hurts little more Pain Location: L LE Pain Descriptors / Indicators: Discomfort;Sore Pain Intervention(s): Limited activity within patient's tolerance;Monitored during session;Premedicated before session     PT Goals (current goals can now be found in the care plan section) Acute  Rehab PT Goals Patient Stated Goal: to get left prosthetic, to walk again PT Goal Formulation: With patient Time For Goal Achievement: 11/10/20 Potential to Achieve Goals: Fair Progress towards PT goals: Goals downgraded-see care plan    Frequency    Min 3X/week      PT Plan Current plan remains appropriate       AM-PAC PT "6 Clicks" Mobility   Outcome Measure  Help needed turning from your back to your side while in a flat bed without using bedrails?: A Little Help needed moving from lying on your back to sitting on the side of a flat bed without using bedrails?: A Little Help needed moving to and from a bed to a chair (including a wheelchair)?: A Lot Help needed standing up from a chair using your arms (e.g., wheelchair or bedside chair)?: A Lot Help needed to walk in hospital room?: Total Help needed climbing 3-5 steps with a railing? : Total 6 Click Score: 12    End of Session Equipment Utilized During Treatment: Gait belt Activity Tolerance: Other (comment);Patient limited by fatigue (fatigued from attempt to don prosthetic and also fear of falling) Patient left: with call bell/phone within reach;in bed;with bed alarm set Nurse Communication: Mobility status PT Visit Diagnosis: Unsteadiness on feet (R26.81);Other abnormalities of gait and mobility (R26.89);Pain;Muscle weakness (generalized) (M62.81) Pain - Right/Left: Left Pain - part of body: Leg     Time: 1128-1208 PT Time Calculation (min) (ACUTE ONLY): 40 min  Charges:  $Therapeutic Activity: 8-22 mins $Prosthetics Training: 8-73mns                     Drew Lips B. VMigdalia DkPT, DPT Acute Rehabilitation Services Pager [phone] [phone]   [place]7/25/2022, 2:39 PM

## 2020-10-30 NOTE — Patient Outreach (Signed)
[facility]) Care Management  10/30/2020  Keith [name] [birth year] [name]  Case Closure   Referral received : 09/20/20 Referral source; Insurance Plan Insurance : Bernadene Person, [place] Admissions 2 in last 2 months.    Hospital Admission 6/23-6/29 Dx Hyperglycemia Glucose 123456 , Acute metabolic encephalopathy resolved, chronic venous ulcer left leg, ulcer left heel .   ED visit 10/07/20 Hyperglycemia glucose 554 Ulcer left foot.    PMHX: includes but not limited to Diabetes type 2, A1c 13.9%, HIV, CKD, CVA, Chronic pain, chronic ulcer left leg, s/p right BKA, HTN, HLD, CAD s/p stent.   Subjective:   Patient was readmitted at [facility]  10/17/20 Dx ; Cellulitis left heel ulcer , s/p Left BKA on 10/27/20.  Patient has been readmitted greater than 10 days.    Plan Will close case to [facility] care management services patient has been admitted greater than 10 days.[facility] Care Management follow for TOC disposition.    [name], RN, BSN  [place] Management Coordinator  [phone]- Mobile [phone]- Toll Free Main Office

## 2020-10-31 DIAGNOSIS — M86172 Other acute osteomyelitis, left ankle and foot: Secondary | ICD-10-CM | POA: Diagnosis not present

## 2020-10-31 LAB — GLUCOSE, CAPILLARY
Glucose-Capillary: 127 mg/dL — ABNORMAL HIGH (ref 70–99)
Glucose-Capillary: 114 mg/dL — ABNORMAL HIGH (ref 70–99)
Glucose-Capillary: 182 mg/dL — ABNORMAL HIGH (ref 70–99)
Glucose-Capillary: 164 mg/dL — ABNORMAL HIGH (ref 70–99)

## 2020-10-31 NOTE — Progress Notes (Addendum)
PROGRESS NOTE    Keith [name]  [id number] DOB: [birth year] DOA: 10/17/2020 [name], NP   Chief Complain:left heel ulcer  Brief Narrative:  Patient is a  55 year old male with past medical history of HIV, chronic kidney disease stage IIIb, type 2 diabetes mellitus, hypertension, hyperlipidemia, coronary artery disease, status post right below knee amputation who was sent to the emergency department by his orthopedic surgeon due to concern for left heel ulcer that has failed outpatient therapy.  MRI confirmed osteomyelitis.  He underwent left transtibial amputation by [name].  PT/OT recommended skilled subsequent discharge.  TOC consulted.  Patient is medically stable for discharge to skilled nursing facility as soon as bed is available.  Assessment & Plan:   Active Problems:   Elevated blood pressure reading with diagnosis of hypertension   Cellulitis   Ulcer of heel due to diabetes mellitus (HCC)   Renal insufficiency   Normochromic normocytic anemia   Cellulitis of left foot   Acute osteomyelitis of left calcaneus ([place])   1 left foot ulcer Patient failed outpatient therapy with doxycycline Osteomyelitis was confirmed by MRI. Patient underwent left BKA October 27, 2020 Orthopedic were following, recommend outpatient follow-up.  2.  HIV stable Continue HAART regimen  3.  Chronic kidney disease stage IIIb-IV Currently kidney function around baseline.  Baseline creatinine around 2.5.  We recommend to follow-up with nephrology as an outpatient.  4.  Diabetic nephropathy/diabetic neuropathy Continue home medications, on gabapentin  5.  Severely uncontrolled diabetes mellitus with hyperglycemia  last hemoglobin A1c was 14 Continue current insulin regimen. Likely noncompliant on insulin.  We recommend to continue previous insulin regimen on discharge  6.  Ambulatory dysfunction secondary to bilateral amputation PT OT following.  Recommended skilled nursing  facility on discharge.  TOC consulted  7.  Hypertension Continue current medications.  Blood pressure stable.  Continue to monitor  8.  Normocytic anemia Hemoglobin currently stable in the range of 8.  Most likely associated  with chronic kidney disease.         DVT prophylaxis:Heparin [place] Code Status: Full Family Communication: None at bedside Status is: Inpatient  Remains inpatient appropriate because:Unsafe d/c plan  Dispo:  Patient From: Home  Planned Disposition: [facility]  Medically stable for discharge: No      Consultants: Orthopedics  Procedures:Left BKA  Antimicrobials:  Anti-infectives (From admission, onward)    Start     Dose/Rate Route Frequency Ordered Stop   10/27/20 1245  ceFAZolin (ANCEF) IVPB 3g/100 mL premix        3 g 200 mL/hr over 30 Minutes Intravenous On call to O.R. 10/27/20 0755 10/27/20 1608   10/20/20 1415  linezolid (ZYVOX) tablet 600 mg  Status:  Discontinued        600 mg Oral Every 12 hours 10/20/20 1326 10/28/20 1115   10/19/20 1445  metroNIDAZOLE (FLAGYL) tablet 500 mg  Status:  Discontinued        500 mg Oral Every 12 hours 10/19/20 1347 10/28/20 1115   10/18/20 1600  ceFEPIme (MAXIPIME) 2 g in sodium chloride 0.9 % 100 mL IVPB  Status:  Discontinued        2 g 200 mL/hr over 30 Minutes Intravenous Every 12 hours 10/18/20 0414 10/28/20 1112   10/18/20 1000  dapsone tablet 100 mg        100 mg Oral Daily 10/18/20 0425     10/18/20 1000  bictegravir-emtricitabine-tenofovir AF (BIKTARVY) 50-200-25 MG per tablet 1  tablet        1 tablet Oral Daily 10/18/20 0610     10/18/20 0415  metroNIDAZOLE (FLAGYL) IVPB 500 mg  Status:  Discontinued        500 mg 100 mL/hr over 60 Minutes Intravenous Every 8 hours 10/18/20 0404 10/19/20 1347   10/18/20 0415  ceFEPIme (MAXIPIME) 2 g in sodium chloride 0.9 % 100 mL IVPB        2 g 200 mL/hr over 30 Minutes Intravenous  Once 10/18/20 0414 10/18/20 0500   10/18/20 0345  vancomycin  (VANCOREADY) IVPB 2000 mg/400 mL        2,000 mg 200 mL/hr over 120 Minutes Intravenous  Once 10/18/20 0335 10/18/20 [id number]       Subjective:  Patient seen and examined at the bedside this morning. He did not have any new complaints besides pain on the surgical site.  Appeared comfortable  Objective: Vitals:   10/30/20 1658 10/30/20 2159 10/31/20 0628 10/31/20 1044  BP: 91/60 (!) 135/109 (!) 91/58 113/61  Pulse: 68 67 64 66  Resp: '17 20 20 18  '$ Temp: 98.3 F (36.8 C) 98.3 F (36.8 C) 97.8 F (36.6 C) 98.2 F (36.8 C)  TempSrc: Oral Oral Oral Oral  SpO2: 97% 94% 97% 99%  Weight:      Height:        Intake/Output Summary (Last 24 hours) at 10/31/2020 1320 Last data filed at 10/31/2020 0900 Gross per 24 hour  Intake 360 ml  Output 300 ml  Net 60 ml   Filed Weights   10/20/20 2107 10/22/20 0525 10/26/20 2221  Weight: 128 kg 124.8 kg 124.8 kg    Examination:  General exam: Overall comfortable, not in distress, morbidly obese HEENT: PERRL Respiratory system:  no wheezes or crackles  Cardiovascular system: S1 & S2 heard, RRR.  Gastrointestinal system: Abdomen is nondistended, soft and nontender. Central nervous system: Alert and oriented Extremities: Right BKA ,left BKA with wound VAC Skin: No rashes, no ulcers,no icterus      Data Reviewed: I have personally reviewed following labs and imaging studies  CBC: Recent Labs  Lab 10/25/20 0534 10/27/20 0505 10/28/20 0424 10/29/20 0322 10/30/20 0715  WBC 7.8 11.0* 12.7* 13.7* 11.9*  HGB 10.4* 10.3* 10.5* 8.6* 8.4*  HCT 33.3* 32.8* 32.9* 27.8* 27.1*  MCV 83.5 84.1 84.4 85.0 84.2  PLT 427* 381 405* 355 [id number]   Basic Metabolic Panel: Recent Labs  Lab 10/25/20 0534 10/27/20 0505 10/28/20 0424 10/28/20 0641 10/29/20 0322 10/30/20 0715  NA 132* 133* 128*  --  129* 131*  K 4.4 4.8 6.3* 4.7 4.8 4.9  CL 100 102 100  --  101 103  CO2 25 22 19*  --  19* 21*  GLUCOSE 225* 163* 270*  --  283* 90  BUN 24* 30* 36*  --   48* 54*  CREATININE 2.03* 2.14* 2.29*  --  2.83* 2.99*  CALCIUM 9.1 9.6 9.0  --  8.5* 8.8*  MG 1.8  --   --   --   --   --    GFR: Estimated Creatinine Clearance: 37 mL/min (A) (by C-G formula based on SCr of 2.99 mg/dL (H)). Liver Function Tests: No results for input(s): AST, ALT, ALKPHOS, BILITOT, PROT, ALBUMIN in the last 168 hours. No results for input(s): LIPASE, AMYLASE in the last 168 hours. No results for input(s): AMMONIA in the last 168 hours. Coagulation Profile: No results for input(s): INR, PROTIME in the last 168 hours.  Cardiac Enzymes: No results for input(s): CKTOTAL, CKMB, CKMBINDEX, TROPONINI in the last 168 hours. BNP (last 3 results) No results for input(s): PROBNP in the last 8760 hours. HbA1C: No results for input(s): HGBA1C in the last 72 hours. CBG: Recent Labs  Lab 10/30/20 1209 10/30/20 1655 10/30/20 2157 10/31/20 0621 10/31/20 1224  GLUCAP 118* 125* 105* 127* 114*   Lipid Profile: No results for input(s): CHOL, HDL, LDLCALC, TRIG, CHOLHDL, LDLDIRECT in the last 72 hours. Thyroid Function Tests: No results for input(s): TSH, T4TOTAL, FREET4, T3FREE, THYROIDAB in the last 72 hours. Anemia Panel: No results for input(s): VITAMINB12, FOLATE, FERRITIN, TIBC, IRON, RETICCTPCT in the last 72 hours. Sepsis Labs: No results for input(s): PROCALCITON, LATICACIDVEN in the last 168 hours.  Recent Results (from the past 240 hour(s))  Surgical pcr screen     Status: None   Collection Time: 10/27/20  9:52 AM   Specimen: Nasal Mucosa; Nasal Swab  Result Value Ref Range Status   MRSA, PCR NEGATIVE NEGATIVE Final   Staphylococcus aureus NEGATIVE NEGATIVE Final    Comment: (NOTE) The Xpert SA Assay (FDA approved for NASAL specimens in patients 27 years of age and older), is one component of a comprehensive surveillance program. It is not intended to diagnose infection nor to guide or monitor treatment. Performed at [facility], [place] [address],  [place], [place] [address]          Radiology Studies: No results found.      Scheduled Meds:  allopurinol  300 mg Oral Daily   amLODipine  10 mg Oral Daily   vitamin C  1,000 mg Oral Daily   aspirin EC  81 mg Oral Daily   atorvastatin  80 mg Oral Daily   bictegravir-emtricitabine-tenofovir AF  1 tablet Oral Daily   carvedilol  25 mg Oral BID WC   dapsone  100 mg Oral Daily   escitalopram  20 mg Oral Daily   feeding supplement  296 mL Oral Once   gabapentin  100 mg Oral TID   heparin injection (subcutaneous)  5,000 Units Subcutaneous Q8H   icosapent Ethyl  2 g Oral BID   insulin aspart  0-15 Units Subcutaneous TID WC   insulin aspart  0-5 Units Subcutaneous QHS   insulin glargine  35 Units Subcutaneous Daily   nutrition supplement (JUVEN)  1 packet Oral BID BM   oxybutynin  2.5 mg Oral BID   pantoprazole  40 mg Oral Daily   ticagrelor  90 mg Oral BID   zinc sulfate  220 mg Oral Daily   Continuous Infusions:   LOS: 13 days    Time spent: 25 mins.More than 50% of that time was spent in counseling and/or coordination of care.      Shelly Coss, MD [facility] P7/26/2022, 1:20 PM

## 2020-11-01 ENCOUNTER — Other Ambulatory Visit (HOSPITAL_COMMUNITY): Payer: Medicare HMO

## 2020-11-01 ENCOUNTER — Inpatient Hospital Stay (HOSPITAL_COMMUNITY): Payer: Medicare HMO

## 2020-11-01 DIAGNOSIS — M86172 Other acute osteomyelitis, left ankle and foot: Secondary | ICD-10-CM | POA: Diagnosis not present

## 2020-11-01 LAB — BASIC METABOLIC PANEL
Anion gap: 8 (ref 5–15)
BUN: 79 mg/dL — ABNORMAL HIGH (ref 6–20)
CO2: 20 mmol/L — ABNORMAL LOW (ref 22–32)
Calcium: 9 mg/dL (ref 8.9–10.3)
Chloride: 103 mmol/L (ref 98–111)
Creatinine, Ser: 4.1 mg/dL — ABNORMAL HIGH (ref 0.61–1.24)
GFR, Estimated: 16 mL/min — ABNORMAL LOW (ref >60)
Glucose, Bld: 122 mg/dL — ABNORMAL HIGH (ref 70–99)
Potassium: 5 mmol/L (ref 3.5–5.1)
Sodium: 131 mmol/L — ABNORMAL LOW (ref 135–145)

## 2020-11-01 LAB — GLUCOSE, CAPILLARY
Glucose-Capillary: 124 mg/dL — ABNORMAL HIGH (ref 70–99)
Glucose-Capillary: 131 mg/dL — ABNORMAL HIGH (ref 70–99)
Glucose-Capillary: 116 mg/dL — ABNORMAL HIGH (ref 70–99)
Glucose-Capillary: 101 mg/dL — ABNORMAL HIGH (ref 70–99)

## 2020-11-01 LAB — SURGICAL PATHOLOGY

## 2020-11-01 MED ORDER — ALPRAZOLAM 1 MG PO TABS: 1.0000 mg | ORAL_TABLET | Freq: Every day | ORAL | 0 refills | Status: DC | PRN

## 2020-11-01 MED ORDER — SODIUM BICARBONATE 650 MG PO TABS
650.0000 mg | ORAL_TABLET | Freq: Two times a day (BID) | ORAL | Status: DC
  Administered 2020-11-01 – 2020-11-05 (×9): 650 mg via ORAL
  Filled 2020-11-01 (×9): qty 1

## 2020-11-01 MED ORDER — OXYCODONE HCL 10 MG PO TABS: 10.0000 mg | ORAL_TABLET | Freq: Four times a day (QID) | ORAL | 0 refills | Status: DC | PRN

## 2020-11-01 MED ORDER — AMLODIPINE BESYLATE 5 MG PO TABS: 5.0000 mg | ORAL_TABLET | Freq: Every day | ORAL | Status: DC

## 2020-11-01 MED ORDER — ALLOPURINOL 100 MG PO TABS
100.0000 mg | ORAL_TABLET | Freq: Every day | ORAL | Status: DC
  Administered 2020-11-02 – 2020-12-27 (×56): 100 mg via ORAL
  Filled 2020-11-01 (×57): qty 1

## 2020-11-01 NOTE — Progress Notes (Signed)
PROGRESS NOTE    Keith [name]  [id number] DOB: 1965-08-05 DOA: 10/17/2020 [name], NP   Chief Complain:left heel ulcer  Brief Narrative:  Patient is a  55 year old male with past medical history of HIV, chronic kidney disease stage IIIb, type 2 diabetes mellitus, hypertension, hyperlipidemia, coronary artery disease, status post right below knee amputation who was sent to the emergency department by his orthopedic surgeon due to concern for left heel ulcer that has failed outpatient therapy.  MRI confirmed osteomyelitis.  He underwent left transtibial amputation by [name].  PT/OT recommended skilled nursing facility on discharge.  TOC consulted and following for disposition. Nephrology consulted today for worsening kidney function.  Assessment & Plan:   Active Problems:   Elevated blood pressure reading with diagnosis of hypertension   Cellulitis   Ulcer of heel due to diabetes mellitus (HCC)   Renal insufficiency   Normochromic normocytic anemia   Cellulitis of left foot   Acute osteomyelitis of left calcaneus ([place])   1 left foot ulcer Patient failed outpatient therapy with doxycycline Osteomyelitis was confirmed by MRI. Patient underwent left BKA October 27, 2020 Orthopedic were following, recommend outpatient follow-up.  2.  HIV stable Continue HAART regimen  3.  Chronic kidney disease stage IIIb-IV/non-anion gap metabolic acidosis Baseline creatinine around 2.5.  Creatinine bumped up to the range of 4 today.  We have consulted nephrology.  Also started on sodium bicarb tablets for non-anion gap metabolic acidosis.  Check BMP tomorrow.  Will wait for nephrology for further recommendation.  4.  Diabetic nephropathy/diabetic neuropathy Continue home medications, on gabapentin  5.  Severely uncontrolled diabetes mellitus with hyperglycemia  last hemoglobin A1c was 14 Continue current insulin regimen. Likely noncompliant on insulin.  We recommend to  continue previous insulin regimen on discharge  6.  Ambulatory dysfunction secondary to bilateral amputation PT OT following.  Recommended skilled nursing facility on discharge.  TOC consulted  7.  Hypertension Continue current medications.  Blood pressure stable.  Continue to monitor  8.  Normocytic anemia Hemoglobin currently stable in the range of 8.  Most likely associated  with chronic kidney disease.  9.  Morbid obesity BMI of 39         DVT prophylaxis:Heparin [place] Code Status: Full Family Communication: None at bedside Status is: Inpatient  Remains inpatient appropriate because:Unsafe d/c plan  Dispo:  Patient From: Home  Planned Disposition: [place]  Medically stable for discharge: No      Consultants: Orthopedics  Procedures:Left BKA  Antimicrobials:  Anti-infectives (From admission, onward)    Start     Dose/Rate Route Frequency Ordered Stop   10/27/20 1245  ceFAZolin (ANCEF) IVPB 3g/100 mL premix        3 g 200 mL/hr over 30 Minutes Intravenous On call to O.R. 10/27/20 0755 10/27/20 1608   10/20/20 1415  linezolid (ZYVOX) tablet 600 mg  Status:  Discontinued        600 mg Oral Every 12 hours 10/20/20 1326 10/28/20 1115   10/19/20 1445  metroNIDAZOLE (FLAGYL) tablet 500 mg  Status:  Discontinued        500 mg Oral Every 12 hours 10/19/20 1347 10/28/20 1115   10/18/20 1600  ceFEPIme (MAXIPIME) 2 g in sodium chloride 0.9 % 100 mL IVPB  Status:  Discontinued        2 g 200 mL/hr over 30 Minutes Intravenous Every 12 hours 10/18/20 0414 10/28/20 1112   10/18/20 1000  dapsone tablet  100 mg        100 mg Oral Daily 10/18/20 0425     10/18/20 1000  bictegravir-emtricitabine-tenofovir AF (BIKTARVY) 50-200-25 MG per tablet 1 tablet        1 tablet Oral Daily 10/18/20 0610     10/18/20 0415  metroNIDAZOLE (FLAGYL) IVPB 500 mg  Status:  Discontinued        500 mg 100 mL/hr over 60 Minutes Intravenous Every 8 hours 10/18/20 0404 10/19/20 1347    10/18/20 0415  ceFEPIme (MAXIPIME) 2 g in sodium chloride 0.9 % 100 mL IVPB        2 g 200 mL/hr over 30 Minutes Intravenous  Once 10/18/20 0414 10/18/20 0500   10/18/20 0345  vancomycin (VANCOREADY) IVPB 2000 mg/400 mL        2,000 mg 200 mL/hr over 120 Minutes Intravenous  Once 10/18/20 0335 10/18/20 [id number]       Subjective:  Patient seen and examined at bedside this morning.  Hemodynamically stable.  Denies any new complaints today.  He is wondering if social worker can talk to him regarding skilled nursing facility placement.  Objective: Vitals:   10/31/20 1044 10/31/20 2120 11/01/20 0500 11/01/20 0932  BP: 113/61 114/70 102/69 99/66  Pulse: 66 70 62 62  Resp: '18 18 18 18  '$ Temp: 98.2 F (36.8 C) 98.9 F (37.2 C) 98.4 F (36.9 C) 98.2 F (36.8 C)  TempSrc: Oral   Oral  SpO2: 99% 94% 94% 98%  Weight:      Height:        Intake/Output Summary (Last 24 hours) at 11/01/2020 1021 Last data filed at 11/01/2020 0900 Gross per 24 hour  Intake 837 ml  Output 425 ml  Net 412 ml   Filed Weights   10/20/20 2107 10/22/20 0525 10/26/20 2221  Weight: 128 kg 124.8 kg 124.8 kg    Examination:  General exam: Overall comfortable, not in distress,obese HEENT: PERRL Respiratory system:  no wheezes or crackles  Cardiovascular system: S1 & S2 heard, RRR.  Gastrointestinal system: Abdomen is nondistended, soft and nontender. Central nervous system: Alert and oriented Extremities: No edema, no clubbing ,no cyanosis, left BKA with wound VAC Skin: No rashes, no ulcers,no icterus      Data Reviewed: I have personally reviewed following labs and imaging studies  CBC: Recent Labs  Lab 10/27/20 0505 10/28/20 0424 10/29/20 0322 10/30/20 0715  WBC 11.0* 12.7* 13.7* 11.9*  HGB 10.3* 10.5* 8.6* 8.4*  HCT 32.8* 32.9* 27.8* 27.1*  MCV 84.1 84.4 85.0 84.2  PLT 381 405* 355 [id number]   Basic Metabolic Panel: Recent Labs  Lab 10/27/20 0505 10/28/20 0424 10/28/20 0641 10/29/20 0322  10/30/20 0715 11/01/20 0413  NA 133* 128*  --  129* 131* 131*  K 4.8 6.3* 4.7 4.8 4.9 5.0  CL 102 100  --  101 103 103  CO2 22 19*  --  19* 21* 20*  GLUCOSE 163* 270*  --  283* 90 122*  BUN 30* 36*  --  48* 54* 79*  CREATININE 2.14* 2.29*  --  2.83* 2.99* 4.10*  CALCIUM 9.6 9.0  --  8.5* 8.8* 9.0   GFR: Estimated Creatinine Clearance: 27 mL/min (A) (by C-G formula based on SCr of 4.1 mg/dL (H)). Liver Function Tests: No results for input(s): AST, ALT, ALKPHOS, BILITOT, PROT, ALBUMIN in the last 168 hours. No results for input(s): LIPASE, AMYLASE in the last 168 hours. No results for input(s): AMMONIA in the last 168 hours.  Coagulation Profile: No results for input(s): INR, PROTIME in the last 168 hours. Cardiac Enzymes: No results for input(s): CKTOTAL, CKMB, CKMBINDEX, TROPONINI in the last 168 hours. BNP (last 3 results) No results for input(s): PROBNP in the last 8760 hours. HbA1C: No results for input(s): HGBA1C in the last 72 hours. CBG: Recent Labs  Lab 10/31/20 0621 10/31/20 1224 10/31/20 1722 10/31/20 2120 11/01/20 0623  GLUCAP 127* 114* 182* 164* 124*   Lipid Profile: No results for input(s): CHOL, HDL, LDLCALC, TRIG, CHOLHDL, LDLDIRECT in the last 72 hours. Thyroid Function Tests: No results for input(s): TSH, T4TOTAL, FREET4, T3FREE, THYROIDAB in the last 72 hours. Anemia Panel: No results for input(s): VITAMINB12, FOLATE, FERRITIN, TIBC, IRON, RETICCTPCT in the last 72 hours. Sepsis Labs: No results for input(s): PROCALCITON, LATICACIDVEN in the last 168 hours.  Recent Results (from the past 240 hour(s))  Surgical pcr screen     Status: None   Collection Time: 10/27/20  9:52 AM   Specimen: Nasal Mucosa; Nasal Swab  Result Value Ref Range Status   MRSA, PCR NEGATIVE NEGATIVE Final   Staphylococcus aureus NEGATIVE NEGATIVE Final    Comment: (NOTE) The Xpert SA Assay (FDA approved for NASAL specimens in patients 24 years of age and older), is one  component of a comprehensive surveillance program. It is not intended to diagnose infection nor to guide or monitor treatment. Performed at [facility], [place] [address], [place], [place] [address]          Radiology Studies: No results found.      Scheduled Meds:  allopurinol  300 mg Oral Daily   amLODipine  10 mg Oral Daily   vitamin C  1,000 mg Oral Daily   aspirin EC  81 mg Oral Daily   atorvastatin  80 mg Oral Daily   bictegravir-emtricitabine-tenofovir AF  1 tablet Oral Daily   carvedilol  25 mg Oral BID WC   dapsone  100 mg Oral Daily   escitalopram  20 mg Oral Daily   gabapentin  100 mg Oral TID   heparin injection (subcutaneous)  5,000 Units Subcutaneous Q8H   icosapent Ethyl  2 g Oral BID   insulin aspart  0-15 Units Subcutaneous TID WC   insulin aspart  0-5 Units Subcutaneous QHS   insulin glargine  35 Units Subcutaneous Daily   nutrition supplement (JUVEN)  1 packet Oral BID BM   oxybutynin  2.5 mg Oral BID   pantoprazole  40 mg Oral Daily   sodium bicarbonate  650 mg Oral BID   ticagrelor  90 mg Oral BID   zinc sulfate  220 mg Oral Daily   Continuous Infusions:   LOS: 14 days    Time spent: 25 mins.More than 50% of that time was spent in counseling and/or coordination of care.      Shelly Coss, MD [facility] P7/27/2022, 10:21 AM

## 2020-11-01 NOTE — Progress Notes (Signed)
Physical Therapy Treatment Patient Details Name: Keith [name] MRN: [id number] DOB: 04-13-1965 Today's Date: 11/01/2020    History of Present Illness Keith [name] is a 55 y.o. male  who is sent to the ED by his orthopedic surgery provider due to concern for left heel ulcer that had failed outpatient therapy. with medical history significant for HIV, CKD 3B, type 2 diabetes, hypertension, hyperlipidemia, coronary artery disease, status post right below the knee amputation, now s/p L BKA 10/27/20.    PT Comments    Pt supine in bed on entry agreeable to getting up to chair. Asked pt if prosthetist had come to adjust his R LE prosthesis and pt replies he is able to don in supine now. Pt able to demonstrate with min A and set up. Pt comes to EoB for slideboard transfer to recliner, which pt refuses due to fear of falling. Pt asks for RW so he can "just walk over to the chair." Pt educated on far greater risk of falling with use of RW to transfer. Pt can not reason how now that he is a B BKA he will not be able to walk. Eventually able to convince pt to learn how to perform an A-P transfer to recliner with no chance of falling. Pt educated on need for wheelchair use for near future and use of A-P transfer for safety. Pt requires min Ax2 for transfer to chair. D/c plans remain appropriate at this time. PT will continue to follow acutely.    Follow Up Recommendations  SNF     Equipment Recommendations  Wheelchair (measurements PT);Wheelchair cushion (measurements PT)       Precautions / Restrictions Precautions Precautions: Fall;Other (comment) (Wound Vac L LE) Precaution Comments: B BKA, prosthesis for Right Required Braces or Orthoses: Other Brace Other Brace: limb protector on left Restrictions Other Position/Activity Restrictions: NWB on left    Mobility  Bed Mobility Overal bed mobility: Modified Independent             General bed mobility comments: HOB elevated     Transfers Overall transfer level: Needs assistance Equipment used: Sliding board;Rolling walker (2 wheeled) Transfers: Comptroller transfers: Min assist;+2 safety/equipment   General transfer comment: able to attempt anterior posterior transfer this session, however, with max encouragement. heavy cueing for hand placement and min guidance of shifting hips with scoot pad.      Balance Overall balance assessment: Needs assistance Sitting-balance support: Bilateral upper extremity supported;Feet unsupported Sitting balance-Leahy Scale: Fair Sitting balance - Comments: Pt able to don shrinker   Standing balance support: Bilateral upper extremity supported;During functional activity Standing balance-Leahy Scale: Poor Standing balance comment: reliant on bil UE support with standing                            Cognition Arousal/Alertness: Awake/alert Behavior During Therapy: WFL for tasks assessed/performed Overall Cognitive Status: No family/caregiver present to determine baseline cognitive functioning Area of Impairment: Safety/judgement                       Following Commands: Follows multi-step commands with increased time;Follows one step commands consistently Safety/Judgement: Decreased awareness of safety   Problem Solving: Requires verbal cues;Requires tactile cues General Comments: decreased safety awareness as to why he can not stand up for transfer to chair. educated on the importance safety, transitioning and sequencing.      Exercises  Amputee Exercises Chair Push Up:  (x2)    General Comments General comments (skin integrity, edema, etc.): VSS on RA      Pertinent Vitals/Pain Pain Assessment: Faces Faces Pain Scale: Hurts a little bit Pain Location: L LE Pain Descriptors / Indicators: Discomfort;Sore Pain Intervention(s): Limited activity within patient's tolerance;Monitored during  session;Repositioned     PT Goals (current goals can now be found in the care plan section) Acute Rehab PT Goals Patient Stated Goal: to get left prosthetic, to walk again PT Goal Formulation: With patient Time For Goal Achievement: 11/10/20 Potential to Achieve Goals: Fair Progress towards PT goals: Progressing toward goals    Frequency    Min 3X/week      PT Plan Current plan remains appropriate    Co-evaluation PT/OT/SLP Co-Evaluation/Treatment: Yes Reason for Co-Treatment: Complexity of the patient's impairments (multi-system involvement) PT goals addressed during session: Mobility/safety with mobility OT goals addressed during session: ADL's and self-care;Strengthening/ROM      AM-PAC PT "6 Clicks" Mobility   Outcome Measure  Help needed turning from your back to your side while in a flat bed without using bedrails?: A Little Help needed moving from lying on your back to sitting on the side of a flat bed without using bedrails?: A Little Help needed moving to and from a bed to a chair (including a wheelchair)?: A Lot Help needed standing up from a chair using your arms (e.g., wheelchair or bedside chair)?: A Lot Help needed to walk in hospital room?: Total Help needed climbing 3-5 steps with a railing? : Total 6 Click Score: 12    End of Session Equipment Utilized During Treatment: Gait belt Activity Tolerance: Other (comment);Patient limited by fatigue (fatigued from attempt to don prosthetic and also fear of falling) Patient left: with call bell/phone within reach;in bed;with bed alarm set Nurse Communication: Mobility status PT Visit Diagnosis: Unsteadiness on feet (R26.81);Other abnormalities of gait and mobility (R26.89);Pain;Muscle weakness (generalized) (M62.81) Pain - Right/Left: Left Pain - part of body: Leg     Time: [id number] PT Time Calculation (min) (ACUTE ONLY): 31 min  Charges:  $Therapeutic Activity: 8-22 mins                     Keith [name]  B. Migdalia Dk PT, DPT Acute Rehabilitation Services Pager [phone] Office [phone]    [place] 11/01/2020, 2:08 PM

## 2020-11-01 NOTE — Plan of Care (Signed)
  Problem: Skin Integrity: Goal: Skin integrity will improve Outcome: Progressing   

## 2020-11-01 NOTE — Consult Note (Addendum)
[facility]  INPATIENT CONSULTATION  Reason for Consultation: AKI on CKDIIIb Requesting Provider: Dr Shelly Coss  HPI: CUTHBERT TURTON is an 55 y.o. male with PMHx of hypertension, hyperlipidemia, HIV, CAD s/p stenting, poorly controlled type II DM, CKDIIIb, CVA, chronic pain, s/p right BKA who presented with concerns of a infected left heel ulcer that had failed outpatient therapy. MRI of heel concerning for early osteomyelitis for which patient had transtibial amputation of LLE on 7/22. Patient has been progressing well and is recommended for SNF on discharge; however, noted to have worsening serum creatinine this morning for which nephrology consulted.  Mr Darian Cansler reports feeling well overall. He is still adjusting to the L BKA. Reports eating and drinking well. Denies any chest pain, shortness of breath, dizziness/lightheadedness or nausea. Reports urinating well and denies any urinary symptoms at this time.   PMH: Past Medical History:  Diagnosis Date   Abscess of right foot    abscess/ulcer of R transtibial amputation requiring IV abx   AIDS ([place])    Anemia    CAD (coronary artery disease)    a. MI with stenting of OM1 in 11/2018 with residual disease. b. acute STEMI 03/2020 s/p DES to OM1   Chronic anemia    Chronic knee pain    right   Chronic pain    CKD (chronic kidney disease), stage IV (HCC)    CVA (cerebral vascular accident) ([place]) 04/2020   Diabetes type 2, uncontrolled ([place])    HgA1c 17.6 (04/27/2010)   Diabetic foot ulcer ([place]) 01/2017   right foot   Dilatation of aorta (HCC)    Erectile dysfunction    Genital warts    HIV (human immunodeficiency virus infection) ([place]) 2009   CD4 count 100, VL 13800 (05/01/2010)   Hyperlipidemia    Hypertension    Noncompliance with medication regimen    Osteomyelitis ([place])    h/o hand   Osteomyelitis of metatarsal ([place]) 04/28/2017   PSH: Past Surgical History:  Procedure Laterality Date   AMPUTATION  Right 05/02/2017   Procedure: AMPUTATION TRANSMETARSAL;  Surgeon: [name], MD;  Location: [place];  Service: Orthopedics;  Laterality: Right;   AMPUTATION Right 06/13/2017   Procedure: RIGHT BELOW KNEE AMPUTATION;  Surgeon: [name], MD;  Location: [place];  Service: Orthopedics;  Laterality: Right;   AMPUTATION Left 10/27/2020   Procedure: LEFT BELOW KNEE AMPUTATION;  Surgeon: [name], MD;  Location: [place];  Service: Orthopedics;  Laterality: Left;   APPLICATION OF WOUND VAC Left 10/27/2020   Procedure: APPLICATION OF WOUND VAC;  Surgeon: [name], MD;  Location: [place];  Service: Orthopedics;  Laterality: Left;   BELOW KNEE LEG AMPUTATION Right 06/13/2017   BELOW KNEE LEG AMPUTATION     CORONARY STENT INTERVENTION N/A 11/10/2018   Procedure: CORONARY STENT INTERVENTION;  Surgeon: Leonie Man, MD;  Location: [place] [facility];  Service: Cardiovascular;  Laterality: N/A;   CORONARY/GRAFT ACUTE MI REVASCULARIZATION N/A 03/21/2020   Procedure: Coronary/Graft Acute MI Revascularization;  Surgeon: [name], MD;  Location: [place] [facility];  Service: Cardiovascular;  Laterality: N/A;   HERNIA REPAIR     I & D EXTREMITY Left 08/21/2014   Procedure: INCISION AND DRAINAGE LEFT SMALL FINGER;  Surgeon: Leanora Cover, MD;  Location: [place];  Service: Orthopedics;  Laterality: Left;   I & D EXTREMITY Right 03/18/2017   Procedure: IRRIGATION AND DEBRIDEMENT EXTREMITY;  Surgeon: [name], MD;  Location: [place];  Service: Orthopedics;  Laterality: Right;   LEFT HEART CATH AND CORONARY ANGIOGRAPHY N/A 11/10/2018   Procedure: LEFT HEART CATH AND CORONARY ANGIOGRAPHY;  Surgeon: Leonie Man, MD;  Location: [place] [facility];  Service: Cardiovascular;  Laterality: N/A;   LEFT HEART CATH AND CORONARY ANGIOGRAPHY N/A 03/21/2020   Procedure: LEFT HEART CATH AND CORONARY ANGIOGRAPHY;  Surgeon: [name], MD;  Location: [place] [facility];  Service: Cardiovascular;   Laterality: N/A;   MINOR IRRIGATION AND DEBRIDEMENT OF WOUND Right 04/22/2014   Procedure: IRRIGATION AND DEBRIDEMENT OF RIGHT NECK ABCESS;  Surgeon: Jerrell Belfast, MD;  Location: [place];  Service: ENT;  Laterality: Right;   MULTIPLE EXTRACTIONS WITH ALVEOLOPLASTY N/A 01/18/2013   Procedure: MULTIPLE EXTRACION 3, 6, 7, 10, 11, 13, 21, 22, 27, 28, 29, 30 WITH ALVEOLOPLASTY;  Surgeon: Gae Bon, DDS;  Location: [place];  Service: Oral Surgery;  Laterality: N/A;   SKIN SPLIT GRAFT Right 03/21/2017   Procedure: IRRIGATION AND DEBRIDEMENT RIGHT FOOT AND APPLY SPLIT THICKNESS SKIN GRAFT AND WOUND VAC;  Surgeon: [name], MD;  Location: [place];  Service: Orthopedics;  Laterality: Right;   TEE WITHOUT CARDIOVERSION N/A 05/02/2017   Procedure: TRANSESOPHAGEAL ECHOCARDIOGRAM (TEE);  Surgeon: Acie Fredrickson Wonda Cheng, MD;  Location: [facility];  Service: Cardiovascular;  Laterality: N/A;  coincidental to orthopedic case   Past Medical History:  Diagnosis Date   Abscess of right foot    abscess/ulcer of R transtibial amputation requiring IV abx   AIDS ([place])    Anemia    CAD (coronary artery disease)    a. MI with stenting of OM1 in 11/2018 with residual disease. b. acute STEMI 03/2020 s/p DES to OM1   Chronic anemia    Chronic knee pain    right   Chronic pain    CKD (chronic kidney disease), stage IV (HCC)    CVA (cerebral vascular accident) ([place]) 04/2020   Diabetes type 2, uncontrolled ([place])    HgA1c 17.6 (04/27/2010)   Diabetic foot ulcer ([place]) 01/2017   right foot   Dilatation of aorta (HCC)    Erectile dysfunction    Genital warts    HIV (human immunodeficiency virus infection) ([place]) 2009   CD4 count 100, VL 13800 (05/01/2010)   Hyperlipidemia    Hypertension    Noncompliance with medication regimen    Osteomyelitis ([place])    h/o hand   Osteomyelitis of metatarsal ([place]) 04/28/2017    Medications:  I have reviewed the patient's current medications.  Medications Prior to Admission   Medication Sig Dispense Refill   acetaminophen (TYLENOL) 325 MG tablet Take 2 tablets (650 mg total) by mouth every 4 (four) hours as needed for mild pain, fever or headache.     allopurinol (ZYLOPRIM) 300 MG tablet Take 300 mg by mouth daily.     amLODipine (NORVASC) 10 MG tablet Take 1 tablet (10 mg total) by mouth daily. 30 tablet 1   aspirin 81 MG EC tablet Take 1 tablet (81 mg total) by mouth daily. 30 tablet 12   atorvastatin (LIPITOR) 80 MG tablet Take 1 tablet (80 mg total) by mouth daily. 30 tablet 1   bictegravir-emtricitabine-tenofovir AF (BIKTARVY) 50-200-25 MG TABS tablet Take 1 tablet by mouth daily. 90 tablet 3   blood glucose meter kit and supplies Dispense based on patient and insurance preference. Use up to four times daily as directed. (FOR ICD-10 E10.9, E11.9). 1 each 0   carvedilol (  COREG) 25 MG tablet Take 1 tablet (25 mg total) by mouth 2 (two) times daily with a meal. 60 tablet 1   dapsone 100 MG tablet TAKE 1 TABLET (100 MG TOTAL) BY MOUTH DAILY. (Patient taking differently: Take 100 mg by mouth daily.) 30 tablet 0   escitalopram (LEXAPRO) 20 MG tablet Take 20 mg by mouth daily.     gabapentin (NEURONTIN) 100 MG capsule Take 1 capsule (100 mg total) by mouth 3 (three) times daily. 90 capsule 1   glucose blood (COOL BLOOD GLUCOSE TEST STRIPS) test strip Use as instructed 100 each 0   icosapent Ethyl (VASCEPA) 1 g capsule TAKE 2 CAPSULES (2 G TOTAL) BY MOUTH TWO TIMES DAILY. (Patient taking differently: Take 2 g by mouth 2 (two) times daily.) 120 capsule 3   insulin aspart protamine- aspart (NOVOLOG MIX 70/30) (70-30) 100 UNIT/ML injection Inject 0.3 mLs (30 Units total) into the skin 2 (two) times daily with a meal. With breakfast and dinner. (Patient taking differently: No sig reported) 18 mL 2   lisinopril (ZESTRIL) 5 MG tablet Take 5 mg by mouth daily.     metFORMIN (GLUCOPHAGE) 1000 MG tablet Take 1,000 mg by mouth 2 (two) times daily.     nitroGLYCERIN (NITROSTAT) 0.4  MG SL tablet PLACE 1 TABLET (0.4 MG TOTAL) UNDER THE TONGUE EVERY FIVE MINUTES X 3 DOSES AS NEEDED FOR CHEST PAIN. (Patient taking differently: Place 0.4 mg under the tongue every 5 (five) minutes x 3 doses as needed for chest pain.) 25 tablet 12   oxybutynin (DITROPAN) 5 MG tablet TAKE 1 TABLET (5 MG TOTAL) BY MOUTH TWO TIMES DAILY. (Patient taking differently: Take 5 mg by mouth 2 (two) times daily.) 60 tablet 2   ticagrelor (BRILINTA) 90 MG TABS tablet Take 90 mg by mouth 2 (two) times daily.     traZODone (DESYREL) 100 MG tablet Take 100 mg by mouth at bedtime.     [DISCONTINUED] ALPRAZolam (XANAX) 1 MG tablet Take 1 mg by mouth daily as needed for anxiety.     [DISCONTINUED] oxyCODONE 10 MG TABS Take 1 tablet (10 mg total) by mouth every 6 (six) hours as needed for severe pain (Severe pain.). 10 tablet 0   doxycycline (VIBRAMYCIN) 100 MG capsule Take 1 capsule (100 mg total) by mouth 2 (two) times daily. One po bid x 7 days (Patient not taking: Reported on 10/18/2020) 13 capsule 0   Lancets (ONETOUCH ULTRASOFT) lancets Use as instructed 100 each 12    ALLERGIES:   Allergies  Allergen Reactions   Elemental Sulfur Itching    Patient stated he's allergic to "sulfur" AND "sulfa"   Sulfa Antibiotics Itching    FAM HX: Family History  Problem Relation Age of Onset   Hypertension Mother    Arthritis Father    Hypertension Father    Hypertension Brother    Cancer Maternal Grandmother 12       unknown type of cancer   Depression Paternal Grandmother     Social History:   reports that he has never smoked. He has never used smokeless tobacco. He reports previous alcohol use. He reports previous drug use.  ROS: Negative except as stated in HPI.   Blood pressure 99/66, pulse 62, temperature 98.2 F (36.8 C), temperature source Oral, resp. rate 18, height $RemoveBe'5\' 10"'MTiuDbBzx$  (1.778 m), weight 124.8 kg, SpO2 98 %. PHYSICAL EXAM: Gen: middle aged male, no acute distress resting comfortably in bedside  chair  HEENT: [place]/AT, EOMI, MMM  Neck: supple, no appreciable JVD CV: RRR, normal S1 and S2 Lungs: clear to auscultation bilaterally, on room air  Abd: soft, nondistended, nontender, +BS Extr: bilateral BKA ; wound vac on LLE Neuro: alert/oriented x4, no obvious focal deficits  Skin: warm and dry   Results for orders placed or performed during the hospital encounter of 10/17/20 (from the past 48 hour(s))  Glucose, capillary     Status: Abnormal   Collection Time: 10/30/20 12:09 PM  Result Value Ref Range   Glucose-Capillary 118 (H) 70 - 99 mg/dL    Comment: Glucose reference range applies only to samples taken after fasting for at least 8 hours.  Glucose, capillary     Status: Abnormal   Collection Time: 10/30/20  4:55 PM  Result Value Ref Range   Glucose-Capillary 125 (H) 70 - 99 mg/dL    Comment: Glucose reference range applies only to samples taken after fasting for at least 8 hours.  Glucose, capillary     Status: Abnormal   Collection Time: 10/30/20  9:57 PM  Result Value Ref Range   Glucose-Capillary 105 (H) 70 - 99 mg/dL    Comment: Glucose reference range applies only to samples taken after fasting for at least 8 hours.  Glucose, capillary     Status: Abnormal   Collection Time: 10/31/20  6:21 AM  Result Value Ref Range   Glucose-Capillary 127 (H) 70 - 99 mg/dL    Comment: Glucose reference range applies only to samples taken after fasting for at least 8 hours.  Glucose, capillary     Status: Abnormal   Collection Time: 10/31/20 12:24 PM  Result Value Ref Range   Glucose-Capillary 114 (H) 70 - 99 mg/dL    Comment: Glucose reference range applies only to samples taken after fasting for at least 8 hours.  Glucose, capillary     Status: Abnormal   Collection Time: 10/31/20  5:22 PM  Result Value Ref Range   Glucose-Capillary 182 (H) 70 - 99 mg/dL    Comment: Glucose reference range applies only to samples taken after fasting for at least 8 hours.  Glucose, capillary      Status: Abnormal   Collection Time: 10/31/20  9:20 PM  Result Value Ref Range   Glucose-Capillary 164 (H) 70 - 99 mg/dL    Comment: Glucose reference range applies only to samples taken after fasting for at least 8 hours.  Basic metabolic panel     Status: Abnormal   Collection Time: 11/01/20  4:13 AM  Result Value Ref Range   Sodium 131 (L) 135 - 145 mmol/L   Potassium 5.0 3.5 - 5.1 mmol/L   Chloride 103 98 - 111 mmol/L   CO2 20 (L) 22 - 32 mmol/L   Glucose, Bld 122 (H) 70 - 99 mg/dL    Comment: Glucose reference range applies only to samples taken after fasting for at least 8 hours.   BUN 79 (H) 6 - 20 mg/dL   Creatinine, Ser 4.10 (H) 0.61 - 1.24 mg/dL    Comment: DELTA CHECK NOTED   Calcium 9.0 8.9 - 10.3 mg/dL   GFR, Estimated 16 (L) >60 mL/min    Comment: (NOTE) Calculated using the CKD-EPI Creatinine Equation (2021)    Anion gap 8 5 - 15    Comment: Performed at [facility] [address], [place], [place] [address]  Glucose, capillary     Status: Abnormal   Collection Time: 11/01/20  6:23 AM  Result Value Ref  Range   Glucose-Capillary 124 (H) 70 - 99 mg/dL    Comment: Glucose reference range applies only to samples taken after fasting for at least 8 hours.  Glucose, capillary     Status: Abnormal   Collection Time: 11/01/20 11:04 AM  Result Value Ref Range   Glucose-Capillary 131 (H) 70 - 99 mg/dL    Comment: Glucose reference range applies only to samples taken after fasting for at least 8 hours.    No results found.  Assessment/Plan Mr Kade Demicco is a 55 year old male with PMHx of HIV, CKDIIIb, hypertension, CAD s/p stent, type II diabetes mellitus s/p R BKA admitted for left diabetic heel ulcer concerning for osteomyelitis s/p L BKA on 7/22. Hospital course complicated by AKI on CKD.   AKI on CKDIIIb- baseline sCr ~2.5; noted to be trending up over past several days 2.29>2.83>2.99>4.10. Urinalysis from earlier in admission with glucosuria and proteinuria.  UOP 725cc over past 24 hours; he does have a few episodes of hypotension with SBP in 90's over the past several days; suspect he may have somec component of ATN from hypotension. Will follow up with urine studies and renal US, PVR, C3/C4 complement levels. Will also decrease antihypertensives. Trend renal function. Avoid nephrotoxic agents and renally dose all medications. Non-anion gap metabolic acidosis - sodium bicarb started by primary team , continue to monitor Left foot osteomyelitis s/p BKA: on oxycodone and dilaudid for pain medication; PT/OT recs for SNF, management per primary team  Poorly controlled type II DM with neuropathy: CBG's better controlled on lantus 35U + SSI tid + SSI qHS. Suspect he will need further increase in his lantus dosing.  Hypertension - has had some episodes of hypotension over past 48 hours. Will hold amlodipine at this time. Continue coreg.  Normocytic anemia - most recent iron studies wnl. Continue to monitor CBC.   Sadia Aslam IMTS PGY-3 11/01/2020, 11:07 AM Pager #: [phone]  I have seen and examined this patient and agree with the plan of care. Relative hypotension driving AKI? Will titrate down on the anti-htn. Otherwise I don't see any nephrotoxic agents given (no contrast or NSAIDs).  Will also check urine microscopy for any activity in the sediment but I doubt there will be. Likely has underlying diabetic nephropathy with +proteinuria; will quantify as well. Eventually when his renal function stabilizes would benefit from a SLT2.  U/s and PVR r/o postobstructive process; if no improvement in renal function soon then will consider renal biopsy.  Dwana Melena, MD 11/01/2020, 12:15 PM

## 2020-11-01 NOTE — Progress Notes (Signed)
Occupational Therapy Treatment Patient Details Name: Keith [name] MRN: [id number] DOB: 1965-06-03 Today's Date: 11/01/2020    History of present illness Keith [name] is a 55 y.o. male  who is sent to the ED by his orthopedic surgery provider due to concern for left heel ulcer that had failed outpatient therapy. with medical history significant for HIV, CKD 3B, type 2 diabetes, hypertension, hyperlipidemia, coronary artery disease, status post right below the knee amputation, now s/p L BKA 10/27/20.   OT comments  Pt received in bed, awake and alert, agreeable to OT/PT session to address safety with functional transfers and education in anterior-posterior transfer. Pt received with prosthesis returned with improved fit with ability to don at bed level. Pt required heavy encouragement this session for the importance and safety with functional transfers, specifically initiating with ant-post to progress to standing with process of healing and improved strength. Pt required min A +2 for safety with cueing for hand placement and shifting hips to progress towards recliner. Pt will benefit to continue acute OT and at skilled to improve safety awareness and ADL engagement prior to returning home.   Follow Up Recommendations  SNF    Equipment Recommendations  None recommended by OT    Recommendations for Other Services      Precautions / Restrictions Precautions Precautions: Fall;Other (comment) (Wound Vac L LE) Precaution Comments: B BKA, prosthesis for Right Required Braces or Orthoses: Other Brace Other Brace: limb protector on left Restrictions Other Position/Activity Restrictions: NWB on left       Mobility Bed Mobility Overal bed mobility: Modified Independent             General bed mobility comments: HOB elevated    Transfers Overall transfer level: Needs assistance Equipment used: Sliding board;Rolling walker (2 wheeled) Transfers: Sit to/from Armed forces operational officer transfers: Min assist;+2 safety/equipment   General transfer comment: able to attempt anterior posterior transfer this session, however, with max encouragement. heavy cueing for hand placement and min guidance of shifting hips with scoot pad.    Balance Overall balance assessment: Needs assistance Sitting-balance support: Bilateral upper extremity supported;Feet unsupported Sitting balance-Leahy Scale: Fair Sitting balance - Comments: Pt able to don shrinker   Standing balance support: Bilateral upper extremity supported;During functional activity Standing balance-Leahy Scale: Poor Standing balance comment: reliant on bil UE support with standing                           ADL either performed or assessed with clinical judgement   ADL Overall ADL's : Needs assistance/impaired Eating/Feeding: Set up;Sitting Eating/Feeding Details (indicate cue type and reason): pt received sitting reclined in bed, agreeable to taking meds with apple sauce.                 Lower Body Dressing: Minimal assistance;Bed level Lower Body Dressing Details (indicate cue type and reason): pt given prothesis for footwear management to ensure safety in donning at bed level. Improved donning and doffing this session with adjusting of bed to ensure locked fit by pushing on footboard. Toilet Transfer: Anterior/posterior;+2 for physical assistance;Minimal Print production planner Details (indicate cue type and reason): simulated toilet transfer from bed to recliner with education of anterior-posterior transfer for safety with L BKA and current UE strength. Heavy cueing and guidance required for hand placement and shifting of B hips to assist with transfer.  General ADL Comments: Pt required max encouragement and education in Anterior - posterior transfer due to concerns of slide board (fear of falling) and limited safety awareness with need to stand  with RW for stand pivot.     Vision   Vision Assessment?: No apparent visual deficits   Perception     Praxis      Cognition Arousal/Alertness: Awake/alert Behavior During Therapy: WFL for tasks assessed/performed Overall Cognitive Status: No family/caregiver present to determine baseline cognitive functioning Area of Impairment: Safety/judgement                       Following Commands: Follows multi-step commands with increased time;Follows one step commands consistently Safety/Judgement: Decreased awareness of safety   Problem Solving: Requires verbal cues;Requires tactile cues General Comments: decreased safety awareness as to why he can not stand up for transfer to chair. educated on the importance safety, transitioning and sequencing.        Exercises Amputee Exercises Chair Push Up:  (x2)   Shoulder Instructions       General Comments VSS on RA    Pertinent Vitals/ Pain       Pain Assessment: Faces Faces Pain Scale: Hurts a little bit Pain Location: L LE Pain Descriptors / Indicators: Discomfort;Sore Pain Intervention(s): Monitored during session;Repositioned  Home Living                                          Prior Functioning/Environment              Frequency  Min 2X/week        Progress Toward Goals  OT Goals(current goals can now be found in the care plan section)  Progress towards OT goals: Progressing toward goals  Acute Rehab OT Goals Patient Stated Goal: to get left prosthetic, to walk again OT Goal Formulation: With patient Time For Goal Achievement: 11/11/20 Potential to Achieve Goals: Good ADL Goals Pt Will Perform Lower Body Bathing: with min assist;sitting/lateral leans Pt Will Perform Upper Body Dressing: with set-up;sitting Pt Will Perform Lower Body Dressing: with min assist;sitting/lateral leans Pt Will Transfer to Toilet: with min assist;stand pivot transfer Pt Will Perform Toileting -  Clothing Manipulation and hygiene: with set-up;sitting/lateral leans  Plan Discharge plan remains appropriate    Co-evaluation    PT/OT/SLP Co-Evaluation/Treatment: Yes Reason for Co-Treatment: Complexity of the patient's impairments (multi-system involvement);For patient/therapist safety;To address functional/ADL transfers   OT goals addressed during session: ADL's and self-care;Strengthening/ROM      AM-PAC OT "6 Clicks" Daily Activity     Outcome Measure   Help from another person eating meals?: None Help from another person taking care of personal grooming?: A Little Help from another person toileting, which includes using toliet, bedpan, or urinal?: A Lot Help from another person bathing (including washing, rinsing, drying)?: A Lot Help from another person to put on and taking off regular upper body clothing?: A Little Help from another person to put on and taking off regular lower body clothing?: A Lot 6 Click Score: 16    End of Session Equipment Utilized During Treatment: Gait belt (wound vac, and limb protector)  OT Visit Diagnosis: Other abnormalities of gait and mobility (R26.89);Muscle weakness (generalized) (M62.81);Pain Pain - Right/Left: Left Pain - part of body: Leg   Activity Tolerance Patient tolerated treatment well;Patient limited by fatigue   Patient Left in  chair;with chair alarm set;with call bell/phone within reach   Nurse Communication Mobility status;Weight bearing status        Time: [id number] OT Time Calculation (min): 28 min  Charges: OT General Charges $OT Visit: 1 Visit OT Treatments $Self Care/Home Management : 8-22 mins  Minus Breeding, MSOT, OTR/L  Supplemental Rehabilitation Services  [phone]    Marius Ditch 11/01/2020, 11:14 AM

## 2020-11-02 ENCOUNTER — Inpatient Hospital Stay (HOSPITAL_COMMUNITY): Payer: Medicare HMO

## 2020-11-02 ENCOUNTER — Encounter (HOSPITAL_COMMUNITY): Payer: Self-pay | Admitting: Internal Medicine

## 2020-11-02 DIAGNOSIS — M86172 Other acute osteomyelitis, left ankle and foot: Secondary | ICD-10-CM | POA: Diagnosis not present

## 2020-11-02 HISTORY — PX: IR US GUIDE VASC ACCESS RIGHT: IMG2390

## 2020-11-02 HISTORY — PX: IR FLUORO GUIDE CV LINE RIGHT: IMG2283

## 2020-11-02 LAB — CBC
HCT: 24 % — ABNORMAL LOW (ref 39.0–52.0)
Hemoglobin: 7.5 g/dL — ABNORMAL LOW (ref 13.0–17.0)
MCH: 26.5 pg (ref 26.0–34.0)
MCHC: 31.3 g/dL (ref 30.0–36.0)
MCV: 84.8 fL (ref 80.0–100.0)
Platelets: 374 10*3/uL (ref 150–400)
RBC: 2.83 MIL/uL — ABNORMAL LOW (ref 4.22–5.81)
RDW: 13.9 % (ref 11.5–15.5)
WBC: 9.1 10*3/uL (ref 4.0–10.5)
nRBC: 0.2 % (ref 0.0–0.2)

## 2020-11-02 LAB — C4 COMPLEMENT: Complement C4, Body Fluid: 35 mg/dL (ref 12–38)

## 2020-11-02 LAB — RENAL FUNCTION PANEL
Albumin: 2.3 g/dL — ABNORMAL LOW (ref 3.5–5.0)
Anion gap: 11 (ref 5–15)
BUN: 85 mg/dL — ABNORMAL HIGH (ref 6–20)
CO2: 20 mmol/L — ABNORMAL LOW (ref 22–32)
Calcium: 9.2 mg/dL (ref 8.9–10.3)
Chloride: 101 mmol/L (ref 98–111)
Creatinine, Ser: 4.91 mg/dL — ABNORMAL HIGH (ref 0.61–1.24)
GFR, Estimated: 13 mL/min — ABNORMAL LOW (ref >60)
Glucose, Bld: 95 mg/dL (ref 70–99)
Phosphorus: 5.9 mg/dL — ABNORMAL HIGH (ref 2.5–4.6)
Potassium: 5.2 mmol/L — ABNORMAL HIGH (ref 3.5–5.1)
Sodium: 132 mmol/L — ABNORMAL LOW (ref 135–145)

## 2020-11-02 LAB — GLUCOSE, CAPILLARY
Glucose-Capillary: 87 mg/dL (ref 70–99)
Glucose-Capillary: 76 mg/dL (ref 70–99)
Glucose-Capillary: 72 mg/dL (ref 70–99)
Glucose-Capillary: 114 mg/dL — ABNORMAL HIGH (ref 70–99)

## 2020-11-02 LAB — HEPATITIS C ANTIBODY: HCV Ab: NONREACTIVE

## 2020-11-02 LAB — C3 COMPLEMENT: C3 Complement: 175 mg/dL — ABNORMAL HIGH (ref 82–167)

## 2020-11-02 LAB — HEPATITIS B SURFACE ANTIGEN: Hepatitis B Surface Ag: NONREACTIVE

## 2020-11-02 LAB — HEPATITIS B CORE ANTIBODY, TOTAL: Hep B Core Total Ab: NONREACTIVE

## 2020-11-02 MED ORDER — CEFAZOLIN SODIUM-DEXTROSE 2-4 GM/100ML-% IV SOLN
INTRAVENOUS | Status: AC
  Filled 2020-11-02: qty 100

## 2020-11-02 MED ORDER — HEPARIN SODIUM (PORCINE) 1000 UNIT/ML IJ SOLN
INTRAMUSCULAR | Status: AC
  Filled 2020-11-02: qty 1

## 2020-11-02 MED ORDER — SODIUM ZIRCONIUM CYCLOSILICATE 5 G PO PACK
5.0000 g | PACK | Freq: Once | ORAL | Status: AC
  Administered 2020-11-02: 5 g via ORAL
  Filled 2020-11-02: qty 1

## 2020-11-02 MED ORDER — FENTANYL CITRATE (PF) 100 MCG/2ML IJ SOLN
INTRAMUSCULAR | Status: AC
  Filled 2020-11-02: qty 4

## 2020-11-02 MED ORDER — MIDAZOLAM HCL 2 MG/2ML IJ SOLN
INTRAMUSCULAR | Status: AC | PRN
Start: 2020-11-02 — End: 2020-11-02
  Administered 2020-11-02: 0.5 mg via INTRAVENOUS

## 2020-11-02 MED ORDER — LIDOCAINE HCL 1 % IJ SOLN
INTRAMUSCULAR | Status: AC | PRN
Start: 2020-11-02 — End: 2020-11-02
  Administered 2020-11-02: 10 mL

## 2020-11-02 MED ORDER — FENTANYL CITRATE (PF) 100 MCG/2ML IJ SOLN
INTRAMUSCULAR | Status: AC | PRN
  Administered 2020-11-02: 25 ug via INTRAVENOUS

## 2020-11-02 MED ORDER — CEFAZOLIN SODIUM-DEXTROSE 2-4 GM/100ML-% IV SOLN
2.0000 g | INTRAVENOUS | Status: AC
  Administered 2020-11-02: 2 g via INTRAVENOUS

## 2020-11-02 MED ORDER — LIDOCAINE HCL 1 % IJ SOLN
INTRAMUSCULAR | Status: AC
  Filled 2020-11-02: qty 20

## 2020-11-02 MED ORDER — GELATIN ABSORBABLE 12-7 MM EX MISC
CUTANEOUS | Status: AC
  Filled 2020-11-02: qty 1

## 2020-11-02 MED ORDER — MIDAZOLAM HCL 2 MG/2ML IJ SOLN
INTRAMUSCULAR | Status: AC
  Filled 2020-11-02: qty 2

## 2020-11-02 MED ORDER — CHLORHEXIDINE GLUCONATE CLOTH 2 % EX PADS
6.0000 | MEDICATED_PAD | Freq: Every day | CUTANEOUS | Status: DC
  Administered 2020-11-02 – 2020-11-20 (×18): 6 via TOPICAL

## 2020-11-02 NOTE — H&P (Signed)
Chief Complaint: Patient was seen in consultation today for image guided [facility] placement  Chief Complaint  Patient presents with   Foot Ulcer   at the request of Dr. Augustin Coupe, Lenna Sciara.   Referring Physician(s): Dr. Augustin Coupe, J.   Supervising Physician: Markus Daft  Patient Status: Keith [name] - In-pt  History of Present Illness: Keith [name] is a 55 y.o. male with PMH significant of HIV, CKD stage III, type II DM, HTN, HLD, CAD, s/p right BKA, who is currently hospitalized due to left foot ulcer with concern for cellulitis.  Patient was seen by orthopedic and underwent left BKA with [name] on 10/27/2020. Nephrology was consulted on 11/01/2020 due to worsening serum creatinine.  Serum creatinine increased from 2.99 to 4.10 on 11/01/2020.  Repeat renal function panel on 11/02/2020 showed persistent worsening of serum creatinine now at 4.91 as well as increasing BUN and decreasing GFR.   IR was requested for image guided tunneled dialysis catheter placement.  Patient laying in bed, not in acute distress.  States that he has been discussing with his kidney doctor for possible initiation of hemodialysis. Denise headache, fever, chills, shortness of breath, cough, chest pain, abdominal pain, nausea ,vomiting, and bleeding.   Past Medical History:  Diagnosis Date   Abscess of right foot    abscess/ulcer of R transtibial amputation requiring IV abx   AIDS ([place])    Anemia    CAD (coronary artery disease)    a. MI with stenting of OM1 in 11/2018 with residual disease. b. acute STEMI 03/2020 s/p DES to OM1   Chronic anemia    Chronic knee pain    right   Chronic pain    CKD (chronic kidney disease), stage IV (HCC)    CVA (cerebral vascular accident) ([place]) 04/2020   Diabetes type 2, uncontrolled ([place])    HgA1c 17.6 (04/27/2010)   Diabetic foot ulcer ([place]) 01/2017   right foot   Dilatation of aorta (HCC)    Erectile dysfunction    Genital warts    HIV (human immunodeficiency virus infection) ([place]) 2009    CD4 count 100, VL 13800 (05/01/2010)   Hyperlipidemia    Hypertension    Noncompliance with medication regimen    Osteomyelitis ([place])    h/o hand   Osteomyelitis of metatarsal ([place]) 04/28/2017    Past Surgical History:  Procedure Laterality Date   AMPUTATION Right 05/02/2017   Procedure: AMPUTATION TRANSMETARSAL;  Surgeon: [name], MD;  Location: [place];  Service: Orthopedics;  Laterality: Right;   AMPUTATION Right 06/13/2017   Procedure: RIGHT BELOW KNEE AMPUTATION;  Surgeon: [name], MD;  Location: [place];  Service: Orthopedics;  Laterality: Right;   AMPUTATION Left 10/27/2020   Procedure: LEFT BELOW KNEE AMPUTATION;  Surgeon: [name], MD;  Location: [place];  Service: Orthopedics;  Laterality: Left;   APPLICATION OF WOUND VAC Left 10/27/2020   Procedure: APPLICATION OF WOUND VAC;  Surgeon: [name], MD;  Location: [place];  Service: Orthopedics;  Laterality: Left;   BELOW KNEE LEG AMPUTATION Right 06/13/2017   BELOW KNEE LEG AMPUTATION     CORONARY STENT INTERVENTION N/A 11/10/2018   Procedure: CORONARY STENT INTERVENTION;  Surgeon: Leonie Man, MD;  Location: [place] [facility];  Service: Cardiovascular;  Laterality: N/A;   CORONARY/GRAFT ACUTE MI REVASCULARIZATION N/A 03/21/2020   Procedure: Coronary/Graft Acute MI Revascularization;  Surgeon: [name], MD;  Location: [place] [facility];  Service: Cardiovascular;  Laterality: N/A;  HERNIA REPAIR     I & D EXTREMITY Left 08/21/2014   Procedure: INCISION AND DRAINAGE LEFT SMALL FINGER;  Surgeon: Leanora Cover, MD;  Location: [place];  Service: Orthopedics;  Laterality: Left;   I & D EXTREMITY Right 03/18/2017   Procedure: IRRIGATION AND DEBRIDEMENT EXTREMITY;  Surgeon: [name], MD;  Location: [place];  Service: Orthopedics;  Laterality: Right;   LEFT HEART CATH AND CORONARY ANGIOGRAPHY N/A 11/10/2018   Procedure: LEFT HEART CATH AND CORONARY ANGIOGRAPHY;  Surgeon: Leonie Man, MD;  Location: [place] [facility];  Service: Cardiovascular;  Laterality: N/A;   LEFT HEART CATH AND CORONARY ANGIOGRAPHY N/A 03/21/2020   Procedure: LEFT HEART CATH AND CORONARY ANGIOGRAPHY;  Surgeon: [name], MD;  Location: [place] [facility];  Service: Cardiovascular;  Laterality: N/A;   MINOR IRRIGATION AND DEBRIDEMENT OF WOUND Right 04/22/2014   Procedure: IRRIGATION AND DEBRIDEMENT OF RIGHT NECK ABCESS;  Surgeon: Jerrell Belfast, MD;  Location: [place];  Service: ENT;  Laterality: Right;   MULTIPLE EXTRACTIONS WITH ALVEOLOPLASTY N/A 01/18/2013   Procedure: MULTIPLE EXTRACION 3, 6, 7, 10, 11, 13, 21, 22, 27, 28, 29, 30 WITH ALVEOLOPLASTY;  Surgeon: Gae Bon, DDS;  Location: [place];  Service: Oral Surgery;  Laterality: N/A;   SKIN SPLIT GRAFT Right 03/21/2017   Procedure: IRRIGATION AND DEBRIDEMENT RIGHT FOOT AND APPLY SPLIT THICKNESS SKIN GRAFT AND WOUND VAC;  Surgeon: [name], MD;  Location: [place];  Service: Orthopedics;  Laterality: Right;   TEE WITHOUT CARDIOVERSION N/A 05/02/2017   Procedure: TRANSESOPHAGEAL ECHOCARDIOGRAM (TEE);  Surgeon: Acie Fredrickson Wonda Cheng, MD;  [facility];  Service: Cardiovascular;  Laterality: N/A;  coincidental to orthopedic case    Allergies: Elemental sulfur and Sulfa antibiotics  Medications: Prior to Admission medications   Medication Sig Start Date End Date Taking? Authorizing Provider  acetaminophen (TYLENOL) 325 MG tablet Take 2 tablets (650 mg total) by mouth every 4 (four) hours as needed for mild pain, fever or headache. 05/03/20  Yes Debbe Odea, MD  allopurinol (ZYLOPRIM) 300 MG tablet Take 300 mg by mouth daily. 06/09/20  Yes [provider]  amLODipine (NORVASC) 10 MG tablet Take 1 tablet (10 mg total) by mouth daily. 09/01/20 10/31/20 Yes [name], MD  aspirin 81 MG EC tablet Take 1 tablet (81 mg total) by mouth daily. 03/27/20  Yes [name], MD  atorvastatin (LIPITOR) 80 MG tablet Take 1 tablet (80 mg total) by mouth daily.  09/01/20 10/31/20 Yes [name], MD  bictegravir-emtricitabine-tenofovir AF (BIKTARVY) 50-200-25 MG TABS tablet Take 1 tablet by mouth daily. 05/19/20  Yes [name], MD  blood glucose meter kit and supplies Dispense based on patient and insurance preference. Use up to four times daily as directed. (FOR ICD-10 E10.9, E11.9). 10/17/20  Yes [name], NP  carvedilol (COREG) 25 MG tablet Take 1 tablet (25 mg total) by mouth 2 (two) times daily with a meal. 09/01/20 10/31/20 Yes Kc, Ramesh, MD  dapsone 100 MG tablet TAKE 1 TABLET (100 MG TOTAL) BY MOUTH DAILY. Patient taking differently: Take 100 mg by mouth daily. 03/27/20 03/27/21 Yes [name], Ava, DO  escitalopram (LEXAPRO) 20 MG tablet Take 20 mg by mouth daily. 08/15/20  Yes [provider]  gabapentin (NEURONTIN) 100 MG capsule Take 1 capsule (100 mg total) by mouth 3 (three) times daily. 09/01/20 10/31/20 Yes [name], MD  glucose blood (COOL BLOOD GLUCOSE TEST STRIPS) test strip Use as  instructed 02/16/20  Yes [name], FNP  icosapent Ethyl (VASCEPA) 1 g capsule TAKE 2 CAPSULES (2 G TOTAL) BY MOUTH TWO TIMES DAILY. Patient taking differently: Take 2 g by mouth 2 (two) times daily. 03/27/20 03/27/21 Yes [name], MD  insulin aspart protamine- aspart (NOVOLOG MIX 70/30) (70-30) 100 UNIT/ML injection Inject 0.3 mLs (30 Units total) into the skin 2 (two) times daily with a meal. With breakfast and dinner. Patient taking differently: No sig reported 10/01/20 12/30/20 Yes Enzo Bi, MD  lisinopril (ZESTRIL) 5 MG tablet Take 5 mg by mouth daily.   Yes [provider]  metFORMIN (GLUCOPHAGE) 1000 MG tablet Take 1,000 mg by mouth 2 (two) times daily. 09/05/20  Yes [provider]  nitroGLYCERIN (NITROSTAT) 0.4 MG SL tablet PLACE 1 TABLET (0.4 MG TOTAL) UNDER THE TONGUE EVERY FIVE MINUTES X 3 DOSES AS NEEDED FOR CHEST PAIN. Patient taking differently: Place 0.4 mg under the tongue every 5 (five) minutes x 3  doses as needed for chest pain. 03/27/20 03/27/21 Yes [name], Ava, DO  oxybutynin (DITROPAN) 5 MG tablet TAKE 1 TABLET (5 MG TOTAL) BY MOUTH TWO TIMES DAILY. Patient taking differently: Take 5 mg by mouth 2 (two) times daily. 03/27/20 03/27/21 Yes [name], MD  ticagrelor (BRILINTA) 90 MG TABS tablet Take 90 mg by mouth 2 (two) times daily.   Yes [provider]  traZODone (DESYREL) 100 MG tablet Take 100 mg by mouth at bedtime. 09/11/20  Yes [provider]  ALPRAZolam [name]) 1 MG tablet Take 1 tablet (1 mg total) by mouth daily as needed for anxiety. 11/01/20   Shelly Coss, MD  doxycycline (VIBRAMYCIN) 100 MG capsule Take 1 capsule (100 mg total) by mouth 2 (two) times daily. One po bid x 7 days Patient not taking: Reported on 10/18/2020 10/07/20   Sherwood Gambler, MD  Lancets [facility] ULTRASOFT) lancets Use as instructed 02/16/20   [name], FNP  Oxycodone HCl 10 MG TABS Take 1 tablet (10 mg total) by mouth every 6 (six) hours as needed (Severe pain.). 11/01/20   Shelly Coss, MD     Family History  Problem Relation Age of Onset   Hypertension Mother    Arthritis Father    Hypertension Father    Hypertension Brother    Cancer Maternal Grandmother 8       unknown type of cancer   Depression Paternal Grandmother     Social History   Socioeconomic History   Marital status: Single    Spouse name: Not on file   Number of children: 3   Years of education: 12   Highest education level: Not on file  Occupational History   Occupation: disabled  Tobacco Use   Smoking status: Never   Smokeless tobacco: Never  Vaping Use   Vaping Use: Never used  Substance and Sexual Activity   Alcohol use: Not Currently   Drug use: Not Currently    Comment: OTC ASA   Sexual activity: Yes    Partners: Female  Other Topics Concern   Not on file  Social History Narrative   ** Merged History Encounter **       Worked for the city of [place] for 18 years.    Unemployed.    Applying for disability.   Medicaid patient.   Social Determinants of Health   Financial Resource Strain: Low Risk    Difficulty of Paying Living Expenses: Not hard at all  Food Insecurity: No  Food Insecurity   Worried About Charity fundraiser in the Last Year: Never true   Ran Out of Food in the Last Year: Never true  Transportation Needs: Unmet Transportation Needs   Lack of Transportation (Medical): No   Lack of Transportation (Non-Medical): Yes  Physical Activity: Not on file  Stress: Not on file  Social Connections: Not on file     Review of Systems: A 12 point ROS discussed and pertinent positives are indicated in the HPI above.  All other systems are negative.   Vital Signs: BP 108/74 (BP Location: Right Arm)   Pulse 62   Temp 97.6 F (36.4 C) (Oral)   Resp 18   Ht 5' 10" (1.778 m)   Wt 275 lb 2.2 oz (124.8 kg)   SpO2 100%   BMI 39.48 kg/m   Physical Exam Vitals reviewed.  Constitutional:      General: He is not in acute distress.    Appearance: Normal appearance. He is not ill-appearing.  HENT:     Head: Normocephalic and atraumatic.     Mouth/Throat:     Mouth: Mucous membranes are moist.  Cardiovascular:     Rate and Rhythm: Normal rate and regular rhythm.     Pulses: Normal pulses.     Heart sounds: Normal heart sounds.  Pulmonary:     Effort: Pulmonary effort is normal.     Breath sounds: Normal breath sounds.  Abdominal:     General: Abdomen is flat. Bowel sounds are normal.     Palpations: Abdomen is soft.  Musculoskeletal:     Cervical back: Neck supple.  Skin:    General: Skin is warm and dry.     Coloration: Skin is not jaundiced or pale.  Neurological:     Mental Status: He is alert and oriented to person, place, and time.  Psychiatric:        Mood and Affect: Mood normal.        Behavior: Behavior normal.        Judgment: Judgment normal.    MD Evaluation Airway: WNL Heart: WNL Abdomen: WNL Chest/ Lungs:  WNL ASA  Classification: 3 Mallampati/Airway Score: Two  Imaging: US RENAL  Result Date: 11/01/2020 CLINICAL DATA:  55 year old male with acute renal insufficiency. EXAM: RENAL / URINARY TRACT ULTRASOUND COMPLETE COMPARISON:  Renal ultrasound dated 03/23/2020. FINDINGS: Right Kidney: Renal measurements: 11.9 x 4.6 x 4.7 cm = volume: 135 mL. Mild increased echogenicity. No hydronephrosis or shadowing stone. Left Kidney: Renal measurements: 10.8 x 5.5 x 4 8 cm = volume: 150 mL. Mild increased echogenicity. No hydronephrosis or shadowing stone. Bladder: Appears normal for degree of bladder distention. Other: None. IMPRESSION: Mildly echogenic kidneys may represent medical renal disease. No hydronephrosis or shadowing stone. Electronically Signed   By: Anner Crete M.D.   On: 11/01/2020 21:29   MR FOOT LEFT W WO CONTRAST  Result Date: 10/18/2020 CLINICAL DATA:  Heel ulceration with swelling of the foot. Possible osteomyelitis. EXAM: MRI OF THE LEFT FOREFOOT WITHOUT AND WITH CONTRAST TECHNIQUE: Multiplanar, multisequence MR imaging of the left foot was performed both before and after administration of intravenous contrast. CONTRAST:  52m GADAVIST GADOBUTROL 1 MMOL/ML IV SOLN COMPARISON:  Multiple exams, including 10/07/2020 and radiographs from 10/17/2020. FINDINGS: Osteomyelitis protocol MRI of the foot was obtained, to include the entire foot and ankle. This protocol uses a large field of view to cover the entire foot and ankle, and is suitable for assessing bony structures for  osteomyelitis. Due to the large field of view and imaging plane choice, this protocol is less sensitive for assessing small structures such as ligamentous structures of the foot and ankle, compared to a dedicated forefoot or dedicated hindfoot exam. Bones/Joint/Cartilage Suspected early calcaneal osteomyelitis is observed with endosteal and adjacent marrow edema and enhancement along the posteroinferior calcaneus adjacent to the  large heel ulceration. This is substantially progressive compared to 10/07/2020. Strictly speaking, reactive findings could cause a similar appearance but the progression in bony appearance as well as progression in the ulceration are concerning for early osteomyelitis. No overt bony destructive findings. Small degenerative lesions along the first MTP joint associated with spurring Ligaments Lisfranc ligament grossly intact. No large or prominent ligamentous derangement along the ankle. Muscles and Tendons Diffuse low-grade muscular edema and potentially low-grade enhancement, probably neurogenic. Potential mild proximal plantar fasciitis. Soft tissues Plantar ulceration below the calcaneus about 6.6 by 7.1 cm, with speckled low density in the soft tissues compatible with gas, and associated soft tissue hypoenhancement extending to the periosteal margin of the calcaneus and adjacent plantar fascia. Diffuse subcutaneous edema tracking in the dorsum of the foot and medially and laterally along the ankles suspicious for potential cellulitis. In the subcutaneous tissues superficial to the medial malleolus and medial flexor tendons, elongated bands of hypoenhancement measuring about 5.5 by 0.8 by 4.1 cm may represent incipient abscess formation although overt drainable abscess is not currently identified. IMPRESSION: 1. Progressive calcaneal ulceration associated with new endosteal and adjacent marrow edema and enhancement along the posteroinferior calcaneus suspicious for early osteomyelitis. 2. Subcutaneous edema medially and laterally in the ankle and also along the dorsum of the foot favoring cellulitis. Faint bands of hypoenhancement superficial to the medial malleolus may represent incipient abscess formation although currently a drainable abscess is not identified. 3. Probable neurogenic edema in the muscular tissues diffusely. 4. Potential mild plantar fasciitis. 5. Degenerative findings at the first MTP joint.  Electronically Signed   By: Van Clines M.D.   On: 10/18/2020 07:54   MR FOOT LEFT W WO CONTRAST  Result Date: 10/08/2020 CLINICAL DATA:  Left foot wound and suspicion for osteomyelitis. EXAM: MRI OF THE LEFT FOREFOOT WITHOUT AND WITH CONTRAST TECHNIQUE: Multiplanar, multisequence MR imaging of the left foot was performed both before and after administration of intravenous contrast. Osteomyelitis protocol MRI of the foot was obtained, to include the entire foot and ankle. This protocol uses a large field of view to cover the entire foot and ankle, and is suitable for assessing bony structures for osteomyelitis. Due to the large field of view and imaging plane choice, this protocol is less sensitive for assessing small structures such as ligamentous structures of the foot and ankle, compared to a dedicated forefoot or dedicated hindfoot exam. CONTRAST:  37m GADAVIST GADOBUTROL 1 MMOL/ML IV SOLN COMPARISON:  Radiograph 10/07/2020 FINDINGS: Bones/Joint/Cartilage No pattern of marrow edema or enhancement characteristic of osteomyelitis. Suspected geode or degenerative subcortical cystic lesion along the base of the proximal phalanx great toe. Potential small medial erosion in the head of the first metacarpal on image 56 of series 14, as can sometimes be encountered in the setting of gout. Mild degenerative spurring between the first metatarsal head and the sesamoids. Ligaments The Lisfranc ligament appears intact. Large field of view precludes sensitive assessment of the ankle ligamentous structures. Muscles and Tendons Os peroneus.  Cannot exclude distal tibialis posterior tendinopathy. Soft tissues Substantial dorsal subcutaneous edema in the ankle and foot, cellulitis not excluded. This tracks  into the toes. Cutaneous and subcutaneous defect along the heel below the posterior calcaneus compatible with ulceration. No drainable abscess. IMPRESSION: 1. No osteomyelitis or drainable abscess identified. 2.  Large heel ulcer below the posterior calcaneus. 3. Dorsal subcutaneous edema in the ankle and foot tracking into the toes, cellulitis is not excluded. Electronically Signed   By: Van Clines M.D.   On: 10/08/2020 09:19   DG Foot Complete Left  Result Date: 10/17/2020 CLINICAL DATA:  Pt c/o left heel wound x several weeks; pt has been seen at the wound center. [name] sent the patient to the ED tonight. Hx of DM. No hx of prior injuries or surgeries. Patient could not internally rotate his left leg due t.*comment was truncated*Ulcer on the calcaneus. EXAM: LEFT FOOT - COMPLETE 3+ VIEW COMPARISON:  None. FINDINGS: There is soft tissue ulceration along the plantar surface of the foot adjacent to the calcaneus. There is gas within this ulceration which measures approximately 1 cm in depth. No evidence of erosion of the adjacent calcaneus. There is soft tissue swelling in the forefoot on the dorsal surface. In comparison to radiograph 10/07/2020 the soft tissue ulceration is increased in size measuring 5 cm x 1 cm compared to 2.3 by 1.1 cm. IMPRESSION: 1. Increase in volume of the soft tissue ulceration along the plantar surface of the calcaneus. 2. No evidence of osteomyelitis. 3. Soft tissue swelling of the forefoot. Electronically Signed   By: Suzy Bouchard M.D.   On: 10/17/2020 19:44   DG Foot Complete Left  Result Date: 10/07/2020 CLINICAL DATA:  Left foot wound. EXAM: LEFT FOOT - COMPLETE 3+ VIEW COMPARISON:  None. FINDINGS: There is no evidence of fracture or dislocation. There is no evidence of arthropathy or other focal bone abnormality. Dorsal soft tissue swelling is noted. Soft tissue ulceration is seen inferior to posterior calcaneus. No lytic destruction is seen to suggest osteomyelitis. IMPRESSION: Soft tissue ulceration seen inferior to posterior calcaneus. No definite evidence of osteomyelitis. Electronically Signed   By: Marijo Conception M.D.   On: 10/07/2020 13:39     Labs:  CBC: Recent Labs    10/28/20 0424 10/29/20 0322 10/30/20 0715 11/02/20 0345  WBC 12.7* 13.7* 11.9* 9.1  HGB 10.5* 8.6* 8.4* 7.5*  HCT 32.9* 27.8* 27.1* 24.0*  PLT 405* 355 376 374    COAGS: Recent Labs    03/21/20 2232 04/25/20 0126  INR 1.0 1.0  APTT 26 28    BMP: Recent Labs    10/29/20 0322 10/30/20 0715 11/01/20 0413 11/02/20 0345  NA 129* 131* 131* 132*  K 4.8 4.9 5.0 5.2*  CL 101 103 103 101  CO2 19* 21* 20* 20*  GLUCOSE 283* 90 122* 95  BUN 48* 54* 79* 85*  CALCIUM 8.5* 8.8* 9.0 9.2  CREATININE 2.83* 2.99* 4.10* 4.91*  GFRNONAA 26* 24* 16* 13*    LIVER FUNCTION TESTS: Recent Labs    04/25/20 0126 05/19/20 1152 09/29/20 0009 10/07/20 1405 10/17/20 1807 11/02/20 0345  BILITOT 0.6 0.4 0.6 0.6 0.7  --   AST _0 --   ALT _1 --   ALKPHOS 68  --  123 91 85  --   PROT 7.9 7.0 7.6 8.3* 7.8  --   ALBUMIN 3.3*  --  3.1* 3.1* 2.4* 2.3*    TUMOR MARKERS: No results for input(s): AFPTM, CEA, CA199, CHROMGRNA in the last 8760 hours.  Assessment and Plan:  55 y.o. male with history of CKD stage III currently hospitalized due to left foot ulcer s/p left BKA.  Nephrology was consulted due to decreasing renal function, repeat renal function test showed persistent decrease in renal function.  After thorough discussion and shared decision making with nephrology, patient decided to proceed with initiation of hemodialysis.  IR was requested for image guided tunneled dialysis catheter placement. Patient states that he ate his breakfast around 9 AM today, the procedure is tentatively scheduled for today after 3 PM pending IR schedule.  VSS CBC today showed worsening of chronic anemia, Hgb 7.5.  No leukocytosis, PLT in normal range Blood culture on 10/18/2020 showed no growth 5 days INR on 04/25/20 1.0  On ASA 81 mg daily, subcu heparin every 8 hours - no need for d/c per IR anticoag protocol.   Risks and benefits discussed with  the patient including, but not limited to bleeding, infection, vascular injury, pneumothorax which may require chest tube placement, air embolism or even death  All of the patient's questions were answered, patient is agreeable to proceed. Consent signed and in chart.   Thank you for this interesting consult.  I greatly enjoyed meeting [facility] and look forward to participating in their care.  A copy of this report was sent to the requesting provider on this date.  Electronically Signed: Tera Mater, PA-C 11/02/2020, 10:43 AM   I spent a total of 20 Minutes   in face to face in clinical consultation, greater than 50% of which was counseling/coordinating care for [facility] placement

## 2020-11-02 NOTE — Progress Notes (Signed)
PROGRESS NOTE    Keith [name]  [id number] DOB: [birth year] DOA: 10/17/2020 [name], NP   Chief Complain:left heel ulcer  Brief Narrative:  Patient is a  55 year old male with past medical history of HIV, chronic kidney disease stage IIIb, type 2 diabetes mellitus, hypertension, hyperlipidemia, coronary artery disease, status post right below knee amputation who was sent to the emergency department by his orthopedic surgeon due to concern for left heel ulcer that has failed outpatient therapy.  MRI confirmed osteomyelitis.  He underwent left transtibial amputation by [name].  PT/OT recommended skilled nursing facility on discharge.  TOC consulted and following for disposition. Nephrology consulted  for worsening kidney function.  Plan for temporary dialysis catheter placement for dialysis and kidney biopsy  Assessment & Plan:   Active Problems:   Elevated blood pressure reading with diagnosis of hypertension   Cellulitis   Ulcer of heel due to diabetes mellitus (HCC)   Renal insufficiency   Normochromic normocytic anemia   Cellulitis of left foot   Acute osteomyelitis of left calcaneus ([place])   1 left foot ulcer Patient failed outpatient therapy with doxycycline Osteomyelitis was confirmed by MRI. Patient underwent left BKA October 27, 2020 Orthopedic were following, recommend outpatient follow-up.  2.  HIV stable Continue HAART regimen  3.  AKI on Chronic kidney disease stage IIIb-IV/non-anion gap metabolic acidosis Baseline creatinine around 2.5.  Creatinine bumped up to the range of 4 and creeping up.    Also started on sodium bicarb tablets for non-anion gap metabolic acidosis.  Check BMP tomorrow.   Nephrology planning for temporary dialysis catheter placement, dialysis initiation, kidney biopsy. IR consulted.  ANA,antiDNA, GBM antibodies pending.Hepatitis panel negative.  C4 level normal, C3 mildly elevated Renal ultrasound negative for hydronephrosis,  consistent with chronic renal disease.  Lokelma given for hyperkalemia. He does not have a nephrologist as an outpatient.  4.  Diabetic nephropathy/diabetic neuropathy Continue home medications, on gabapentin  5.  Severely uncontrolled diabetes mellitus with hyperglycemia  last hemoglobin A1c was 14 Continue current insulin regimen. Likely noncompliant on insulin.  We recommend to continue previous insulin regimen on discharge  6.  Ambulatory dysfunction secondary to bilateral amputation PT /OT following.  Recommended skilled nursing facility on discharge.  TOC consulted  7.  Hypertension Antihypertensives D/Ced .BP soft  8.  Normocytic anemia Hemoglobin currently stable in the range of 7-8.  Most likely associated  with chronic kidney disease.  9.  Morbid obesity BMI of 39         DVT prophylaxis:Heparin [place] Code Status: Full Family Communication: None at bedside Status is: Inpatient  Remains inpatient appropriate because:Unsafe d/c plan  Dispo:  Patient From:    Planned Disposition: [place]  Medically stable for discharge:        Consultants: Orthopedics  Procedures:Left BKA  Antimicrobials:  Anti-infectives (From admission, onward)    Start     Dose/Rate Route Frequency Ordered Stop   10/27/20 1245  ceFAZolin (ANCEF) IVPB 3g/100 mL premix        3 g 200 mL/hr over 30 Minutes Intravenous On call to O.R. 10/27/20 0755 10/27/20 1608   10/20/20 1415  linezolid (ZYVOX) tablet 600 mg  Status:  Discontinued        600 mg Oral Every 12 hours 10/20/20 1326 10/28/20 1115   10/19/20 1445  metroNIDAZOLE (FLAGYL) tablet 500 mg  Status:  Discontinued        500 mg Oral Every 12 hours 10/19/20  1347 10/28/20 1115   10/18/20 1600  ceFEPIme (MAXIPIME) 2 g in sodium chloride 0.9 % 100 mL IVPB  Status:  Discontinued        2 g 200 mL/hr over 30 Minutes Intravenous Every 12 hours 10/18/20 0414 10/28/20 1112   10/18/20 1000  dapsone tablet 100 mg        100  mg Oral Daily 10/18/20 0425     10/18/20 1000  bictegravir-emtricitabine-tenofovir AF (BIKTARVY) 50-200-25 MG per tablet 1 tablet        1 tablet Oral Daily 10/18/20 0610     10/18/20 0415  metroNIDAZOLE (FLAGYL) IVPB 500 mg  Status:  Discontinued        500 mg 100 mL/hr over 60 Minutes Intravenous Every 8 hours 10/18/20 0404 10/19/20 1347   10/18/20 0415  ceFEPIme (MAXIPIME) 2 g in sodium chloride 0.9 % 100 mL IVPB        2 g 200 mL/hr over 30 Minutes Intravenous  Once 10/18/20 0414 10/18/20 0500   10/18/20 0345  vancomycin (VANCOREADY) IVPB 2000 mg/400 mL        2,000 mg 200 mL/hr over 120 Minutes Intravenous  Once 10/18/20 0335 10/18/20 [id number]       Subjective:  Patient seen and examined bedside this morning.  Hemodynamically stable.  Denies any complaints today.  Patient says he does not follow with any nephrologist as an outpatient.  Blood pressure was soft but stable.  Objective: Vitals:   11/01/20 0932 11/01/20 1800 11/01/20 2144 11/02/20 0455  BP: 99/66 (!) 92/58 139/65 108/74  Pulse: 62 64 61 62  Resp: '18 18 18 18  '$ Temp: 98.2 F (36.8 C) 98.1 F (36.7 C) 98 F (36.7 C) 97.6 F (36.4 C)  TempSrc: Oral Oral Oral Oral  SpO2: 98% 96% 100% 100%  Weight:      Height:        Intake/Output Summary (Last 24 hours) at 11/02/2020 [id number] Last data filed at 11/01/2020 2144 Gross per 24 hour  Intake 474 ml  Output 300 ml  Net 174 ml   Filed Weights   10/20/20 2107 10/22/20 0525 10/26/20 2221  Weight: 128 kg 124.8 kg 124.8 kg    Examination:  General exam: Overall comfortable, not in distress,obese HEENT: PERRL Respiratory system:  no wheezes or crackles  Cardiovascular system: S1 & S2 heard, RRR.  Gastrointestinal system: Abdomen is nondistended, soft and nontender. Central nervous system: Alert and oriented Extremities: Left BKA with wound VAC Skin: No rashes, no ulcers,no icterus      Data Reviewed: I have personally reviewed following labs and imaging  studies  CBC: Recent Labs  Lab 10/27/20 0505 10/28/20 0424 10/29/20 0322 10/30/20 0715 11/02/20 0345  WBC 11.0* 12.7* 13.7* 11.9* 9.1  HGB 10.3* 10.5* 8.6* 8.4* 7.5*  HCT 32.8* 32.9* 27.8* 27.1* 24.0*  MCV 84.1 84.4 85.0 84.2 84.8  PLT 381 405* 355 376 [id number]   Basic Metabolic Panel: Recent Labs  Lab 10/28/20 0424 10/28/20 0641 10/29/20 0322 10/30/20 0715 11/01/20 0413 11/02/20 0345  NA 128*  --  129* 131* 131* 132*  K 6.3* 4.7 4.8 4.9 5.0 5.2*  CL 100  --  101 103 103 101  CO2 19*  --  19* 21* 20* 20*  GLUCOSE 270*  --  283* 90 122* 95  BUN 36*  --  48* 54* 79* 85*  CREATININE 2.29*  --  2.83* 2.99* 4.10* 4.91*  CALCIUM 9.0  --  8.5* 8.8* 9.0 9.2  PHOS  --   --   --   --   --  5.9*   GFR: Estimated Creatinine Clearance: 22.5 mL/min (A) (by C-G formula based on SCr of 4.91 mg/dL (H)). Liver Function Tests: Recent Labs  Lab 11/02/20 0345  ALBUMIN 2.3*   No results for input(s): LIPASE, AMYLASE in the last 168 hours. No results for input(s): AMMONIA in the last 168 hours. Coagulation Profile: No results for input(s): INR, PROTIME in the last 168 hours. Cardiac Enzymes: No results for input(s): CKTOTAL, CKMB, CKMBINDEX, TROPONINI in the last 168 hours. BNP (last 3 results) No results for input(s): PROBNP in the last 8760 hours. HbA1C: No results for input(s): HGBA1C in the last 72 hours. CBG: Recent Labs  Lab 11/01/20 0623 11/01/20 1104 11/01/20 1635 11/01/20 2144 11/02/20 0639  GLUCAP 124* 131* 116* 101* 87   Lipid Profile: No results for input(s): CHOL, HDL, LDLCALC, TRIG, CHOLHDL, LDLDIRECT in the last 72 hours. Thyroid Function Tests: No results for input(s): TSH, T4TOTAL, FREET4, T3FREE, THYROIDAB in the last 72 hours. Anemia Panel: No results for input(s): VITAMINB12, FOLATE, FERRITIN, TIBC, IRON, RETICCTPCT in the last 72 hours. Sepsis Labs: No results for input(s): PROCALCITON, LATICACIDVEN in the last 168 hours.  Recent Results (from the past  240 hour(s))  Surgical pcr screen     Status: None   Collection Time: 10/27/20  9:52 AM   Specimen: Nasal Mucosa; Nasal Swab  Result Value Ref Range Status   MRSA, PCR NEGATIVE NEGATIVE Final   Staphylococcus aureus NEGATIVE NEGATIVE Final    Comment: (NOTE) The Xpert SA Assay (FDA approved for NASAL specimens in patients 18 years of age and older), is one component of a comprehensive surveillance program. It is not intended to diagnose infection nor to guide or monitor treatment. Performed at [facility], [place] [address], [place], [place] [address]          Radiology Studies: US RENAL  Result Date: 11/01/2020 CLINICAL DATA:  55 year old male with acute renal insufficiency. EXAM: RENAL / URINARY TRACT ULTRASOUND COMPLETE COMPARISON:  Renal ultrasound dated 03/23/2020. FINDINGS: Right Kidney: Renal measurements: 11.9 x 4.6 x 4.7 cm = volume: 135 mL. Mild increased echogenicity. No hydronephrosis or shadowing stone. Left Kidney: Renal measurements: 10.8 x 5.5 x 4 8 cm = volume: 150 mL. Mild increased echogenicity. No hydronephrosis or shadowing stone. Bladder: Appears normal for degree of bladder distention. Other: None. IMPRESSION: Mildly echogenic kidneys may represent medical renal disease. No hydronephrosis or shadowing stone. Electronically Signed   By: Anner Crete M.D.   On: 11/01/2020 21:29        Scheduled Meds:  allopurinol  100 mg Oral Daily   vitamin C  1,000 mg Oral Daily   aspirin EC  81 mg Oral Daily   atorvastatin  80 mg Oral Daily   bictegravir-emtricitabine-tenofovir AF  1 tablet Oral Daily   dapsone  100 mg Oral Daily   escitalopram  20 mg Oral Daily   gabapentin  100 mg Oral TID   heparin injection (subcutaneous)  5,000 Units Subcutaneous Q8H   icosapent Ethyl  2 g Oral BID   insulin aspart  0-15 Units Subcutaneous TID WC   insulin aspart  0-5 Units Subcutaneous QHS   insulin glargine  35 Units Subcutaneous Daily   nutrition supplement (JUVEN)   1 packet Oral BID BM   oxybutynin  2.5 mg Oral BID   pantoprazole  40 mg Oral Daily   sodium bicarbonate  650 mg Oral BID   sodium zirconium cyclosilicate  5 g Oral Once   ticagrelor  90 mg Oral BID   zinc sulfate  220 mg Oral Daily   Continuous Infusions:   LOS: 15 days    Time spent: 25 mins.More than 50% of that time was spent in counseling and/or coordination of care.      Shelly Coss, MD [facility] P7/28/2022, 8:22 AM

## 2020-11-02 NOTE — Progress Notes (Addendum)
[facility] Progress Note   Subjective:   HD 15 Overnight, no acute events reported.  [name] was evaluated at bedside this morning. Discussed worsening renal failure and need for dialysis and possible biopsy. Patient expresses understanding. Denies any urinary symptoms although has had decreased urine output over past 24 hours.   Objective Vitals:   11/01/20 0932 11/01/20 1800 11/01/20 2144 11/02/20 0455  BP: 99/66 (!) 92/58 139/65 108/74  Pulse: 62 64 61 62  Resp: '18 18 18 18  '$ Temp: 98.2 F (36.8 C) 98.1 F (36.7 C) 98 F (36.7 C) 97.6 F (36.4 C)  TempSrc: Oral Oral Oral Oral  SpO2: 98% 96% 100% 100%  Weight:      Height:       Physical Exam General: middle aged male, resting comfortably in bed, no acute distress Heart: RRR, S1 and S2 present  Lungs: CTAB on RA Abdomen: soft, nondistended, nontender, +BS Extremities: bilateral BKA, wound vac on LLE  Dialysis Access: None   Additional Objective Labs: Basic Metabolic Panel: Recent Labs  Lab 10/30/20 0715 11/01/20 0413 11/02/20 0345  NA 131* 131* 132*  K 4.9 5.0 5.2*  CL 103 103 101  CO2 21* 20* 20*  GLUCOSE 90 122* 95  BUN 54* 79* 85*  CREATININE 2.99* 4.10* 4.91*  CALCIUM 8.8* 9.0 9.2  PHOS  --   --  5.9*   Liver Function Tests: Recent Labs  Lab 11/02/20 0345  ALBUMIN 2.3*   No results for input(s): LIPASE, AMYLASE in the last 168 hours. CBC: Recent Labs  Lab 10/27/20 0505 10/28/20 0424 10/29/20 0322 10/30/20 0715 11/02/20 0345  WBC 11.0* 12.7* 13.7* 11.9* 9.1  HGB 10.3* 10.5* 8.6* 8.4* 7.5*  HCT 32.8* 32.9* 27.8* 27.1* 24.0*  MCV 84.1 84.4 85.0 84.2 84.8  PLT 381 405* 355 376 374   Blood Culture    Component Value Date/Time   SDES BLOOD RIGHT WRIST 10/18/2020 0439   SPECREQUEST  10/18/2020 0439    BOTTLES DRAWN AEROBIC AND ANAEROBIC Blood Culture results may not be optimal due to an inadequate volume of blood received in culture bottles   CULT  10/18/2020 0439     NO GROWTH 5 DAYS Performed at [facility], [place] [address] [place],  [address]    REPTSTATUS 10/23/2020 FINAL 10/18/2020 0439    Cardiac Enzymes: No results for input(s): CKTOTAL, CKMB, CKMBINDEX, TROPONINI in the last 168 hours. CBG: Recent Labs  Lab 11/01/20 0623 11/01/20 1104 11/01/20 1635 11/01/20 2144 11/02/20 0639  GLUCAP 124* 131* 116* 101* 87   Iron Studies: No results for input(s): IRON, TIBC, TRANSFERRIN, FERRITIN in the last 72 hours.  Studies/Results: US RENAL  Result Date: 11/01/2020 CLINICAL DATA:  55 year old male with acute renal insufficiency. EXAM: RENAL / URINARY TRACT ULTRASOUND COMPLETE COMPARISON:  Renal ultrasound dated 03/23/2020. FINDINGS: Right Kidney: Renal measurements: 11.9 x 4.6 x 4.7 cm = volume: 135 mL. Mild increased echogenicity. No hydronephrosis or shadowing stone. Left Kidney: Renal measurements: 10.8 x 5.5 x 4 8 cm = volume: 150 mL. Mild increased echogenicity. No hydronephrosis or shadowing stone. Bladder: Appears normal for degree of bladder distention. Other: None. IMPRESSION: Mildly echogenic kidneys may represent medical renal disease. No hydronephrosis or shadowing stone. Electronically Signed   By: Anner Crete M.D.   On: 11/01/2020 21:29   Medications:   allopurinol  100 mg Oral Daily   vitamin C  1,000 mg Oral Daily   aspirin EC  81 mg Oral  Daily   atorvastatin  80 mg Oral Daily   bictegravir-emtricitabine-tenofovir AF  1 tablet Oral Daily   Chlorhexidine Gluconate Cloth  6 each Topical Q0600   dapsone  100 mg Oral Daily   escitalopram  20 mg Oral Daily   gabapentin  100 mg Oral TID   heparin injection (subcutaneous)  5,000 Units Subcutaneous Q8H   icosapent Ethyl  2 g Oral BID   insulin aspart  0-15 Units Subcutaneous TID WC   insulin aspart  0-5 Units Subcutaneous QHS   insulin glargine  35 Units Subcutaneous Daily   nutrition supplement (JUVEN)  1 packet Oral BID BM   oxybutynin  2.5 mg Oral BID    pantoprazole  40 mg Oral Daily   sodium bicarbonate  650 mg Oral BID   sodium zirconium cyclosilicate  5 g Oral Once   ticagrelor  90 mg Oral BID   zinc sulfate  220 mg Oral Daily    Dialysis Orders:  Assessment/Plan: [name] with PMHx of HIV, CKDIIIb, hypertension, CAD s/p stent, type II diabetes mellitus s/p R BKA admitted for left diabetic heel ulcer concerning for osteomyelitis s/p L BKA on 7/22. Hospital course complicated by AKI on CKD.  AKI on CKDIIIb - baseline sCr ~2.5; noted to be worsening during hospital stay. This morning, up to 4.9. Only had 300cc urine output over past 24 hours; Renal ultrasound negative for hydronephrosis, consistent with chronic renal disease. C3/C4 complement wnl. Urine studies pending. Will obtain further serologic studies including ANA, anti-DNA, GBM antibodies and hepatitis panel. In light of worsening renal failure, patient agreeable for HD and renal biopsy. Will get tunneled cath for access today for possible HD later today vs tomorrow. Will continue to trend renal function; and if no improvement, consider renal biopsy on Monday.  Hyperkalemia - Lokelma given, likely 2/2 acute renal failure; monitor and replete prn Non-anion gap acidosis - secondary to worsening renal function as above, continue sodium bicarb.  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  Hypertension: has continued to have soft BP's. Normocytic anemia - continue to monitor   Harvie Heck, MD Internal Medicine, PGY-3 11/02/20 9:33 AM Pager # [phone]  I have seen and examined this patient and agree with the plan of care. Renal function continues to worsen and UOP decreasing to oliguric range. No e/o obstruction. Will complete serologic workup; VIR consulted to place tunneled catheter and planning  on initiating HD 1st thing in the AM.  Planning on biopsy Monday as this is all overwhelming for the patient but broached the subject with him.  Dwana Melena, MD 11/02/2020, 11:04 AM

## 2020-11-02 NOTE — Procedures (Signed)
Interventional Radiology Procedure:   Indications: AKI  Procedure: Tunneled dialysis catheter placement  Findings: Right jugular Palindrome catheter, tip at SVC/RA junction  Complications: None     EBL: Minimal, less than 10 ml  Plan: Catheter is ready to use.      Keith [name] R. Anselm Pancoast, MD  Pager: [phone]

## 2020-11-03 DIAGNOSIS — M86172 Other acute osteomyelitis, left ankle and foot: Secondary | ICD-10-CM | POA: Diagnosis not present

## 2020-11-03 LAB — CBC WITH DIFFERENTIAL/PLATELET
Abs Immature Granulocytes: 0.1 10*3/uL — ABNORMAL HIGH (ref 0.00–0.07)
Basophils Absolute: 0 10*3/uL (ref 0.0–0.1)
Basophils Relative: 0 %
Eosinophils Absolute: 0.2 10*3/uL (ref 0.0–0.5)
Eosinophils Relative: 3 %
HCT: 25.3 % — ABNORMAL LOW (ref 39.0–52.0)
Hemoglobin: 8 g/dL — ABNORMAL LOW (ref 13.0–17.0)
Immature Granulocytes: 1 %
Lymphocytes Relative: 18 %
Lymphs Abs: 1.6 10*3/uL (ref 0.7–4.0)
MCH: 26.7 pg (ref 26.0–34.0)
MCHC: 31.6 g/dL (ref 30.0–36.0)
MCV: 84.3 fL (ref 80.0–100.0)
Monocytes Absolute: 0.5 10*3/uL (ref 0.1–1.0)
Monocytes Relative: 6 %
Neutro Abs: 6.4 10*3/uL (ref 1.7–7.7)
Neutrophils Relative %: 72 %
Platelets: 364 10*3/uL (ref 150–400)
RBC: 3 MIL/uL — ABNORMAL LOW (ref 4.22–5.81)
RDW: 13.8 % (ref 11.5–15.5)
WBC: 8.9 10*3/uL (ref 4.0–10.5)
nRBC: 0.3 % — ABNORMAL HIGH (ref 0.0–0.2)

## 2020-11-03 LAB — RENAL FUNCTION PANEL
Albumin: 2.4 g/dL — ABNORMAL LOW (ref 3.5–5.0)
Anion gap: 10 (ref 5–15)
BUN: 84 mg/dL — ABNORMAL HIGH (ref 6–20)
CO2: 20 mmol/L — ABNORMAL LOW (ref 22–32)
Calcium: 9 mg/dL (ref 8.9–10.3)
Chloride: 101 mmol/L (ref 98–111)
Creatinine, Ser: 4.25 mg/dL — ABNORMAL HIGH (ref 0.61–1.24)
GFR, Estimated: 16 mL/min — ABNORMAL LOW (ref >60)
Glucose, Bld: 108 mg/dL — ABNORMAL HIGH (ref 70–99)
Phosphorus: 5.4 mg/dL — ABNORMAL HIGH (ref 2.5–4.6)
Potassium: 5.1 mmol/L (ref 3.5–5.1)
Sodium: 131 mmol/L — ABNORMAL LOW (ref 135–145)

## 2020-11-03 LAB — ANCA TITERS
Atypical P-ANCA titer: <1:20 {titer}
C-ANCA: <1:20 {titer}
P-ANCA: <1:20 {titer}

## 2020-11-03 LAB — GLOMERULAR BASEMENT MEMBRANE ANTIBODIES: GBM Ab: <0.2 units (ref 0.0–0.9)

## 2020-11-03 LAB — GLUCOSE, CAPILLARY
Glucose-Capillary: 83 mg/dL (ref 70–99)
Glucose-Capillary: 87 mg/dL (ref 70–99)
Glucose-Capillary: 109 mg/dL — ABNORMAL HIGH (ref 70–99)
Glucose-Capillary: 152 mg/dL — ABNORMAL HIGH (ref 70–99)

## 2020-11-03 LAB — ANTI-DNA ANTIBODY, DOUBLE-STRANDED: ds DNA Ab: 1 IU/mL (ref 0–9)

## 2020-11-03 MED ORDER — SODIUM ZIRCONIUM CYCLOSILICATE 10 G PO PACK
10.0000 g | PACK | Freq: Every day | ORAL | Status: DC
  Administered 2020-11-03: 10 g via ORAL
  Filled 2020-11-03: qty 1

## 2020-11-03 NOTE — Progress Notes (Signed)
Occupational Therapy Treatment Patient Details Name: Keith [name] MRN: [name] DOB: [birth year] Today's Date: 11/03/2020    History of present illness Keith [name] is a 55 y.o. male  who is sent to the ED by his orthopedic surgery provider due to concern for left heel ulcer that had failed outpatient therapy. with medical history significant for HIV, CKD 3B, type 2 diabetes, hypertension, hyperlipidemia, coronary artery disease, status post right below the knee amputation, now s/p L BKA 10/27/20.   OT comments  Pt progressing towards acute OT goals. Focus of session was working on lateral scoot transfers towards pt's toilet transfer goal and w/c transfer plan. Pt remains to require some encouragement to complete tasks as independently as possible. Pt also struggles with carryover of information. Completed lateral scoot to drop arm recliner towards pt's right side with R prosthetic on and min guard A +2 for safety. Pt up in recliner at end of session. D/c plan remains appropriate.    Follow Up Recommendations  SNF    Equipment Recommendations  None recommended by OT    Recommendations for Other Services      Precautions / Restrictions Precautions Precautions: Fall;Other (comment) (Wound Vac L LE) Precaution Comments: B BKA, prosthesis for Right Required Braces or Orthoses: Other Brace Other Brace: limb protector on left Restrictions Weight Bearing Restrictions: Yes Other Position/Activity Restrictions: NWB on left       Mobility Bed Mobility Overal bed mobility: Modified Independent             General bed mobility comments: HOB elevated, increased time and effort to come to EoB    Transfers Overall transfer level: Needs assistance Equipment used:  (drop arm recliner) Transfers: Lateral/Scoot Transfers Sit to Stand: Min guard        Lateral/Scoot Transfers: Min guard;+2 safety/equipment General transfer comment: no physical assist needed, however maximal  encouragement to try scoot transfer again requesting RW to walk to chair, needs maximal multimodal cuing for hand placement and sequencing for transfer to drop arm recliner on his R    Balance Overall balance assessment: Needs assistance Sitting-balance support: Bilateral upper extremity supported;Feet unsupported Sitting balance-Leahy Scale: Fair                                     ADL either performed or assessed with clinical judgement   ADL Overall ADL's : Needs assistance/impaired                         Toilet Transfer: Min guard;Cueing for sequencing;BSC;Requires drop arm Toilet Transfer Details (indicate cue type and reason): lateral scoot from EOB to drop arm recliner going towards pt's right side. R residual limb prosthetic on.           General ADL Comments: Pt needed some encouragement to complete all tasks presented as independently as possible. Verbalized fear of falling prior to initiating lateral scoot transfer. Pt asked for rw to stand d/t that feeling like a lower fall risk. Discussed that remaining in a seated position throughout transfer would be safer from therapy standpoint. Pt completed transfer with extra time and effort but no physical assist     Vision       Perception     Praxis      Cognition Arousal/Alertness: Awake/alert Behavior During Therapy: WFL for tasks assessed/performed Overall Cognitive Status: No family/caregiver present to determine baseline  cognitive functioning Area of Impairment: Safety/judgement;Following commands;Problem solving                       Following Commands: Follows multi-step commands with increased time;Follows one step commands consistently Safety/Judgement: Decreased awareness of safety   Problem Solving: Requires verbal cues;Requires tactile cues General Comments: continues to have decreased safety awareness and very littly carry over from prior session, needs increased cuing  for proper shrinker and prosthetic sock placement        Exercises     Shoulder Instructions       General Comments VSS on RA    Pertinent Vitals/ Pain       Pain Assessment: No/denies pain  Home Living                                          Prior Functioning/Environment              Frequency  Min 2X/week        Progress Toward Goals  OT Goals(current goals can now be found in the care plan section)  Progress towards OT goals: Progressing toward goals  Acute Rehab OT Goals Patient Stated Goal: to get left prosthetic, to walk again OT Goal Formulation: With patient Time For Goal Achievement: 11/11/20 Potential to Achieve Goals: Good ADL Goals Pt Will Perform Lower Body Bathing: with min assist;sitting/lateral leans Pt Will Perform Upper Body Dressing: with set-up;sitting Pt Will Perform Lower Body Dressing: with min assist;sitting/lateral leans Pt Will Transfer to Toilet: with min assist;stand pivot transfer Pt Will Perform Toileting - Clothing Manipulation and hygiene: with set-up;sitting/lateral leans  Plan Discharge plan remains appropriate    Co-evaluation    PT/OT/SLP Co-Evaluation/Treatment: Yes Reason for Co-Treatment: Complexity of the patient's impairments (multi-system involvement);For patient/therapist safety   OT goals addressed during session: Proper use of Adaptive equipment and DME;ADL's and self-care;Strengthening/ROM      AM-PAC OT "6 Clicks" Daily Activity     Outcome Measure   Help from another person eating meals?: None Help from another person taking care of personal grooming?: A Little Help from another person toileting, which includes using toliet, bedpan, or urinal?: A Little Help from another person bathing (including washing, rinsing, drying)?: A Little Help from another person to put on and taking off regular upper body clothing?: A Little Help from another person to put on and taking off regular lower  body clothing?: A Little 6 Click Score: 19    End of Session Equipment Utilized During Treatment: Gait belt;Other (comment) (R residual limb prosthetic, drop arm recliner)  OT Visit Diagnosis: Other abnormalities of gait and mobility (R26.89);Muscle weakness (generalized) (M62.81);Pain   Activity Tolerance Patient tolerated treatment well   Patient Left in chair;with chair alarm set;with call bell/phone within reach   Nurse Communication Mobility status        Time: [id number] OT Time Calculation (min): 25 min  Charges: OT General Charges $OT Visit: 1 Visit OT Treatments $Self Care/Home Management : 8-22 mins  Tyrone Schimke, OT Acute Rehabilitation Services Pager: [phone] Office: [phone]    Hortencia Pilar 11/03/2020, 12:48 PM

## 2020-11-03 NOTE — Progress Notes (Addendum)
[facility] Progress Note   Subjective:   HD 16 Overnight, no acute events reported.  [name] was evaluated at bedside this morning. He is resting comfortably in bed; no acute concerns at this time. Discussed that his renal function is slightly improved and will continue to monitor. He expresses understanding. All questions and concerns addressed.   Assessment/Plan: [name] with PMHx of HIV, CKDIIIb, hypertension, CAD s/p stent, type II diabetes mellitus s/p R BKA admitted for left diabetic heel ulcer concerning for osteomyelitis s/p L BKA on 7/22. Hospital course complicated by AKI on CKD.  AKI on CKDIIIb - baseline sCr ~2.5; noted to be worsening during hospital stay. sCr 4.9>4.25 this morning. 675cc urine output over past 24 hours; Renal ultrasound negative for hydronephrosis, consistent with chronic renal disease. C3/C4 complement wnl. Hepatitis panel negative. Urine studies pending. ANA, anti-DNA, GBM antibodies pending. Patient had tunneled HD cath placed yesterday. Given that he had slight improvement in his creatinine this morning, will hold off on HD today.  Will continue to trend renal function; If no significant improvement, then can consider HD over the next 1-2 days. If his renal function continues to improve, supportive of possible ATN 2/2 hypotension.  Hyperkalemia - likely 2/2 acute renal failure; monitor and replete prn Non-anion gap acidosis - secondary to worsening renal function as above, continue sodium bicarb.  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  Hypertension: has continued to have soft BP's. Normocytic anemia - continue to monitor   Keith Heck, MD Internal Medicine, PGY-3 11/03/20 8:08 AM Pager # [phone]  I have seen and examined this patient and  agree with the plan of care. Renal function actually improved slightly with improvement in UOP therefore will hold HD today and re-evaluate tomorrow. Leave tunneled cath in place for now.  Best case scenario is continued renal improvement to avoid renal biopsy and permit removal of catheter early next week.  Dwana Melena, MD 11/03/2020, 10:43 AM  Objective Vitals:   11/02/20 1720 11/02/20 1740 11/02/20 2033 11/03/20 0432  BP: 104/74 116/78 127/82 125/83  Pulse: 60 60 64 66  Resp: '14 18  18  '$ Temp:  97.6 F (36.4 C)  98.6 F (37 C)  TempSrc:  Oral  Oral  SpO2: 99% 100%  96%  Weight:      Height:       Physical Exam General: middle aged male, resting comfortably in bed, no acute distress Heart: RRR, S1 and S2 present  Lungs: CTAB on RA;  Abdomen: soft, nondistended, nontender, +BS Extremities: bilateral BKA, wound vac on LLE  Dialysis Access: tunneled HD cath R subclavian   Additional Objective Labs: Basic Metabolic Panel: Recent Labs  Lab 11/01/20 0413 11/02/20 0345 11/03/20 0425  NA 131* 132* 131*  K 5.0 5.2* 5.1  CL 103 101 101  CO2 20* 20* 20*  GLUCOSE 122* 95 108*  BUN 79* 85* 84*  CREATININE 4.10* 4.91* 4.25*  CALCIUM 9.0 9.2 9.0  PHOS  --  5.9* 5.4*   Liver Function Tests: Recent Labs  Lab 11/02/20 0345 11/03/20 0425  ALBUMIN 2.3* 2.4*   No results for input(s): LIPASE, AMYLASE in the last 168 hours. CBC: Recent Labs  Lab 10/28/20 0424 10/29/20 0322 10/30/20 0715 11/02/20 0345 11/03/20 0425  WBC 12.7* 13.7* 11.9*  9.1 8.9  NEUTROABS  --   --   --   --  6.4  HGB 10.5* 8.6* 8.4* 7.5* 8.0*  HCT 32.9* 27.8* 27.1* 24.0* 25.3*  MCV 84.4 85.0 84.2 84.8 84.3  PLT 405* 355 376 374 364   Blood Culture    Component Value Date/Time   SDES BLOOD RIGHT WRIST 10/18/2020 0439   SPECREQUEST  10/18/2020 0439    BOTTLES DRAWN AEROBIC AND ANAEROBIC Blood Culture results may not be optimal due to an inadequate volume of blood received in culture bottles    CULT  10/18/2020 0439    NO GROWTH 5 DAYS Performed at [facility] [address] [place], [place] [address]    REPTSTATUS 10/23/2020 FINAL 10/18/2020 0439    Cardiac Enzymes: No results for input(s): CKTOTAL, CKMB, CKMBINDEX, TROPONINI in the last 168 hours. CBG: Recent Labs  Lab 11/02/20 0639 11/02/20 1159 11/02/20 1739 11/02/20 2038 11/03/20 0644  GLUCAP 87 76 72 114* 83   Iron Studies: No results for input(s): IRON, TIBC, TRANSFERRIN, FERRITIN in the last 72 hours.  Studies/Results: US RENAL  Result Date: 11/01/2020 CLINICAL DATA:  55 year old male with acute renal insufficiency. EXAM: RENAL / URINARY TRACT ULTRASOUND COMPLETE COMPARISON:  Renal ultrasound dated 03/23/2020. FINDINGS: Right Kidney: Renal measurements: 11.9 x 4.6 x 4.7 cm = volume: 135 mL. Mild increased echogenicity. No hydronephrosis or shadowing stone. Left Kidney: Renal measurements: 10.8 x 5.5 x 4 8 cm = volume: 150 mL. Mild increased echogenicity. No hydronephrosis or shadowing stone. Bladder: Appears normal for degree of bladder distention. Other: None. IMPRESSION: Mildly echogenic kidneys may represent medical renal disease. No hydronephrosis or shadowing stone. Electronically Signed   By: Anner Crete M.D.   On: 11/01/2020 21:29   IR Fluoro Guide CV Line Right  Result Date: 11/02/2020 INDICATION: 75 year old with acute kidney injury. EXAM: FLUOROSCOPIC AND ULTRASOUND GUIDED PLACEMENT OF A TUNNELED DIALYSIS CATHETER Physician: Stephan Minister. Anselm Pancoast, MD MEDICATIONS: Ancef 2 g; The antibiotic was administered within an appropriate time interval prior to skin puncture. ANESTHESIA/SEDATION: Versed 0.5 mg IV; Fentanyl 25 mcg IV; Moderate Sedation Time:  30 minutes The patient was continuously monitored during the procedure by the interventional radiology nurse under my direct supervision. FLUOROSCOPY TIME:  Fluoroscopy Time: 6 seconds, 4 mGy COMPLICATIONS: None immediate. PROCEDURE: The procedure was explained to  the patient. The risks and benefits of the procedure were discussed and the patient's questions were addressed. Informed consent was obtained from the patient. The patient was placed supine on the interventional table. Ultrasound confirmed a patent right internal jugular vein. Ultrasound image obtained for documentation. The right neck and chest was prepped and draped in a sterile fashion. Maximal barrier sterile technique was utilized including caps, mask, sterile gowns, sterile gloves, sterile drape, hand hygiene and skin antiseptic. The right neck was anesthetized with 1% lidocaine. A small incision was made with #11 blade scalpel. A 21 gauge needle directed into the right internal jugular vein with ultrasound guidance. A micropuncture dilator set was placed. A 23 cm tip to cuff Palindrome catheter was selected. The skin below the right clavicle was anesthetized and a small incision was made with an #11 blade scalpel. A subcutaneous tunnel was formed to the vein dermatotomy site. The catheter was brought through the tunnel. The vein dermatotomy site was dilated to accommodate a peel-away sheath. The catheter was placed through the peel-away sheath and directed into the central venous structures. The tip of the catheter was placed at superior  cavoatrial junction with fluoroscopy. Fluoroscopic images were obtained for documentation. Both lumens were found to aspirate and flush well. The proper amount of heparin was flushed in both lumens. The vein dermatotomy site was closed using a single layer of absorbable suture and Dermabond. Gel-Foam was placed in subcutaneous tract. The catheter was secured to the skin using Prolene suture. IMPRESSION: Successful placement of a right jugular tunneled dialysis catheter using ultrasound and fluoroscopic guidance. Electronically Signed   By: Markus Daft M.D.   On: 11/02/2020 17:57   IR US Guide Vasc Access Right  Result Date: 11/02/2020 INDICATION: 55 year old with acute  kidney injury. EXAM: FLUOROSCOPIC AND ULTRASOUND GUIDED PLACEMENT OF A TUNNELED DIALYSIS CATHETER Physician: Stephan Minister. Anselm Pancoast, MD MEDICATIONS: Ancef 2 g; The antibiotic was administered within an appropriate time interval prior to skin puncture. ANESTHESIA/SEDATION: Versed 0.5 mg IV; Fentanyl 25 mcg IV; Moderate Sedation Time:  30 minutes The patient was continuously monitored during the procedure by the interventional radiology nurse under my direct supervision. FLUOROSCOPY TIME:  Fluoroscopy Time: 6 seconds, 4 mGy COMPLICATIONS: None immediate. PROCEDURE: The procedure was explained to the patient. The risks and benefits of the procedure were discussed and the patient's questions were addressed. Informed consent was obtained from the patient. The patient was placed supine on the interventional table. Ultrasound confirmed a patent right internal jugular vein. Ultrasound image obtained for documentation. The right neck and chest was prepped and draped in a sterile fashion. Maximal barrier sterile technique was utilized including caps, mask, sterile gowns, sterile gloves, sterile drape, hand hygiene and skin antiseptic. The right neck was anesthetized with 1% lidocaine. A small incision was made with #11 blade scalpel. A 21 gauge needle directed into the right internal jugular vein with ultrasound guidance. A micropuncture dilator set was placed. A 23 cm tip to cuff Palindrome catheter was selected. The skin below the right clavicle was anesthetized and a small incision was made with an #11 blade scalpel. A subcutaneous tunnel was formed to the vein dermatotomy site. The catheter was brought through the tunnel. The vein dermatotomy site was dilated to accommodate a peel-away sheath. The catheter was placed through the peel-away sheath and directed into the central venous structures. The tip of the catheter was placed at superior cavoatrial junction with fluoroscopy. Fluoroscopic images were obtained for documentation.  Both lumens were found to aspirate and flush well. The proper amount of heparin was flushed in both lumens. The vein dermatotomy site was closed using a single layer of absorbable suture and Dermabond. Gel-Foam was placed in subcutaneous tract. The catheter was secured to the skin using Prolene suture. IMPRESSION: Successful placement of a right jugular tunneled dialysis catheter using ultrasound and fluoroscopic guidance. Electronically Signed   By: Markus Daft M.D.   On: 11/02/2020 17:57   Medications:   allopurinol  100 mg Oral Daily   vitamin C  1,000 mg Oral Daily   aspirin EC  81 mg Oral Daily   atorvastatin  80 mg Oral Daily   bictegravir-emtricitabine-tenofovir AF  1 tablet Oral Daily   Chlorhexidine Gluconate Cloth  6 each Topical Q0600   dapsone  100 mg Oral Daily   escitalopram  20 mg Oral Daily   gabapentin  100 mg Oral TID   heparin injection (subcutaneous)  5,000 Units Subcutaneous Q8H   icosapent Ethyl  2 g Oral BID   insulin aspart  0-15 Units Subcutaneous TID WC   insulin aspart  0-5 Units Subcutaneous QHS   insulin glargine  35 Units Subcutaneous Daily   nutrition supplement (JUVEN)  1 packet Oral BID BM   oxybutynin  2.5 mg Oral BID   pantoprazole  40 mg Oral Daily   sodium bicarbonate  650 mg Oral BID   sodium zirconium cyclosilicate  10 g Oral Daily   ticagrelor  90 mg Oral BID   zinc sulfate  220 mg Oral Daily    Dialysis Orders:

## 2020-11-03 NOTE — Progress Notes (Signed)
Physical Therapy Treatment Patient Details Name: Keith [name] MRN: [name] DOB: 05/27/1965 Today's Date: 11/03/2020    History of Present Illness Keith [name] is a 55 y.o. male  who is sent to the ED by his orthopedic surgery provider due to concern for left heel ulcer that had failed outpatient therapy. with medical history significant for HIV, CKD 3B, type 2 diabetes, hypertension, hyperlipidemia, coronary artery disease, status post right below the knee amputation, now s/p L BKA 10/27/20.    PT Comments    Pt supine in bed on entry, agreeable to therapy. Pt continues to have poor understanding of safety now that he is a B BKA. Pt again requesting RW to "walk" to recliner. Pt with increased effort to place R shrinker and prosthetic sock. PT needs to start him over due to large wrinkles in shrinker, once in place pt asks PT to place prosthetic so he can seat it pushing on the foot board. Once prosthetic donned pt is mod I for coming to EoB. Again requiring increased cuing and encouragement about safety of scoot transfer to drop arm recliner on his R. Pt able to complete transfer without physical assist but needs maximal cuing for sequencing. D/c plans remain appropriate at this time. PT will continue to follow acutely.     Follow Up Recommendations  SNF     Equipment Recommendations  Wheelchair (measurements PT);Wheelchair cushion (measurements PT)       Precautions / Restrictions Precautions Precautions: Fall;Other (comment) (Wound Vac L LE) Precaution Comments: B BKA, prosthesis for Right Required Braces or Orthoses: Other Brace Other Brace: limb protector on left Restrictions Weight Bearing Restrictions: Yes Other Position/Activity Restrictions: NWB on left    Mobility  Bed Mobility Overal bed mobility: Modified Independent             General bed mobility comments: HOB elevated, increased time and effort to come to EoB    Transfers Overall transfer level: Needs  assistance   Transfers: Lateral/Scoot Transfers          Lateral/Scoot Transfers: Min guard;+2 safety/equipment General transfer comment: no physical assist needed, however maximal encouragement to try scoot transfer again requesting RW to walk to chair, needs maximal multimodal cuing for hand placement and sequencing for transfer to drop arm recliner on his R      Balance Overall balance assessment: Needs assistance Sitting-balance support: Bilateral upper extremity supported;Feet unsupported Sitting balance-Leahy Scale: Fair                                      Cognition Arousal/Alertness: Awake/alert Behavior During Therapy: WFL for tasks assessed/performed Overall Cognitive Status: No family/caregiver present to determine baseline cognitive functioning Area of Impairment: Safety/judgement                       Following Commands: Follows multi-step commands with increased time;Follows one step commands consistently Safety/Judgement: Decreased awareness of safety   Problem Solving: Requires verbal cues;Requires tactile cues General Comments: continues to have decreased safety awareness and very littly carry over from prior session, needs increased cuing for proper shrinker and prosthetic sock placement         General Comments General comments (skin integrity, edema, etc.): VSS on RA      Pertinent Vitals/Pain Pain Assessment: No/denies pain     PT Goals (current goals can now be found in the care plan  section) Acute Rehab PT Goals Patient Stated Goal: to get left prosthetic, to walk again PT Goal Formulation: With patient Time For Goal Achievement: 11/10/20 Potential to Achieve Goals: Fair Progress towards PT goals: Progressing toward goals    Frequency    Min 3X/week      PT Plan Current plan remains appropriate    Co-evaluation PT/OT/SLP Co-Evaluation/Treatment: Yes Reason for Co-Treatment: Complexity of the patient's  impairments (multi-system involvement);For patient/therapist safety          AM-PAC PT "6 Clicks" Mobility   Outcome Measure  Help needed turning from your back to your side while in a flat bed without using bedrails?: None Help needed moving from lying on your back to sitting on the side of a flat bed without using bedrails?: A Little Help needed moving to and from a bed to a chair (including a wheelchair)?: A Little Help needed standing up from a chair using your arms (e.g., wheelchair or bedside chair)?: A Lot Help needed to walk in hospital room?: Total Help needed climbing 3-5 steps with a railing? : Total 6 Click Score: 14    End of Session Equipment Utilized During Treatment: Gait belt Activity Tolerance: Other (comment);Patient tolerated treatment well (fatigued from donning prosthetic) Patient left: with call bell/phone within reach;in chair;with chair alarm set Nurse Communication: Mobility status PT Visit Diagnosis: Unsteadiness on feet (R26.81);Other abnormalities of gait and mobility (R26.89);Pain;Muscle weakness (generalized) (M62.81) Pain - Right/Left: Left Pain - part of body: Leg     Time: [id number] PT Time Calculation (min) (ACUTE ONLY): 30 min  Charges:  $Therapeutic Activity: 8-22 mins                     Eeshan Verbrugge B. Migdalia Dk PT, DPT Acute Rehabilitation Services Pager [phone] Office [phone]    [place] 11/03/2020, 11:55 AM

## 2020-11-03 NOTE — Progress Notes (Signed)
PROGRESS NOTE    Keith [name]  [id number] DOB: April 05, 1966 DOA: 10/17/2020 [name], NP   Chief Complain:left heel ulcer  Brief Narrative:  Patient is a  55 year old male with past medical history of HIV, chronic kidney disease stage IIIb, type 2 diabetes mellitus, hypertension, hyperlipidemia, coronary artery disease, status post right below knee amputation who was sent to the emergency department by his orthopedic surgeon due to concern for left heel ulcer that has failed outpatient therapy.  MRI confirmed osteomyelitis.  He underwent left transtibial amputation by [name].  PT/OT recommended skilled nursing facility on discharge.  TOC consulted and following for disposition. Nephrology consulted  for worsening kidney function.  Underwent  temporary dialysis catheter placement for possible dialysis .  Assessment & Plan:   Active Problems:   Elevated blood pressure reading with diagnosis of hypertension   Cellulitis   Ulcer of heel due to diabetes mellitus (HCC)   Renal insufficiency   Normochromic normocytic anemia   Cellulitis of left foot   Acute osteomyelitis of left calcaneus (HCC)   1.  AKI on Chronic kidney disease stage IIIb-IV/non-anion gap metabolic acidosis Baseline creatinine around 2.5.  Creatinine bumped up to the range of 4 and creeped up.    Also started on sodium bicarb tablets for non-anion gap metabolic acidosis.  .   Underwent temporary dialysis catheter placement,plan for  dialysis initiation but holding off for today.  There was also plan for kidney biopsy, will defer to nephrology.   ANA,antiDNA, GBM antibodies pending.Hepatitis panel negative.  C4 level normal, C3 mildly elevated Renal ultrasound negative for hydronephrosis, consistent with chronic renal disease.  Hyperkalemia resolved after giving Lokelma. He does not have a nephrologist established yet as an outpatient  2. left foot ulcer Patient failed outpatient therapy with  doxycycline Osteomyelitis was confirmed by MRI. Patient underwent left BKA October 27, 2020 Orthopedic were following, recommend outpatient follow-up.  3.  HIV stable Continue HAART regimen  4.  Diabetic nephropathy/diabetic neuropathy Continue home medications, on gabapentin  5.  Severely uncontrolled diabetes mellitus with hyperglycemia  last hemoglobin A1c was 14 Continue current insulin regimen. Likely noncompliant on insulin.  We recommend to continue previous insulin regimen on discharge  6.  Ambulatory dysfunction secondary to bilateral amputation PT /OT following.  Recommended skilled nursing facility on discharge.  TOC consulted  7.  Hypertension Antihypertensives D/Ced .BP soft  8.  Normocytic anemia Hemoglobin currently stable in the range of 7-8.  Most likely associated  with chronic kidney disease.  9.  Morbid obesity BMI of 39         DVT prophylaxis:Heparin [place] Code Status: Full Family Communication: None at bedside Status is: Inpatient  Remains inpatient appropriate because:Unsafe d/c plan  Dispo:  Patient From:    Planned Disposition: [place]  Medically stable for discharge:        Consultants: Orthopedics  Procedures:Left BKA  Antimicrobials:  Anti-infectives (From admission, onward)    Start     Dose/Rate Route Frequency Ordered Stop   11/02/20 1130  ceFAZolin (ANCEF) IVPB 2g/100 mL premix        2 g 200 mL/hr over 30 Minutes Intravenous To Radiology 11/02/20 1042 11/02/20 1704   10/27/20 1245  ceFAZolin (ANCEF) IVPB 3g/100 mL premix        3 g 200 mL/hr over 30 Minutes Intravenous On call to O.R. 10/27/20 0755 10/27/20 1608   10/20/20 1415  linezolid (ZYVOX) tablet 600 mg  Status:  Discontinued        600 mg Oral Every 12 hours 10/20/20 1326 10/28/20 1115   10/19/20 1445  metroNIDAZOLE (FLAGYL) tablet 500 mg  Status:  Discontinued        500 mg Oral Every 12 hours 10/19/20 1347 10/28/20 1115   10/18/20 1600  ceFEPIme  (MAXIPIME) 2 g in sodium chloride 0.9 % 100 mL IVPB  Status:  Discontinued        2 g 200 mL/hr over 30 Minutes Intravenous Every 12 hours 10/18/20 0414 10/28/20 1112   10/18/20 1000  dapsone tablet 100 mg        100 mg Oral Daily 10/18/20 0425     10/18/20 1000  bictegravir-emtricitabine-tenofovir AF (BIKTARVY) 50-200-25 MG per tablet 1 tablet        1 tablet Oral Daily 10/18/20 0610     10/18/20 0415  metroNIDAZOLE (FLAGYL) IVPB 500 mg  Status:  Discontinued        500 mg 100 mL/hr over 60 Minutes Intravenous Every 8 hours 10/18/20 0404 10/19/20 1347   10/18/20 0415  ceFEPIme (MAXIPIME) 2 g in sodium chloride 0.9 % 100 mL IVPB        2 g 200 mL/hr over 30 Minutes Intravenous  Once 10/18/20 0414 10/18/20 0500   10/18/20 0345  vancomycin (VANCOREADY) IVPB 2000 mg/400 mL        2,000 mg 200 mL/hr over 120 Minutes Intravenous  Once 10/18/20 0335 10/18/20 [id number]       Subjective:  Patient seen and examined the bedside this morning.  Hemodynamically stable.  Denies any complaints today except for pain on the amputation site.  Objective: Vitals:   11/02/20 1720 11/02/20 1740 11/02/20 2033 11/03/20 0432  BP: 104/74 116/78 127/82 125/83  Pulse: 60 60 64 66  Resp: '14 18  18  '$ Temp:  97.6 F (36.4 C)  98.6 F (37 C)  TempSrc:  Oral  Oral  SpO2: 99% 100%  96%  Weight:      Height:        Intake/Output Summary (Last 24 hours) at 11/03/2020 0804 Last data filed at 11/03/2020 0747 Gross per 24 hour  Intake 340 ml  Output 675 ml  Net -335 ml   Filed Weights   10/20/20 2107 10/22/20 0525 10/26/20 2221  Weight: 128 kg 124.8 kg 124.8 kg    Examination:  General exam: Overall comfortable, not in distress,obese HEENT: PERRL Respiratory system:  no wheezes or crackles  Cardiovascular system: S1 & S2 heard, RRR.  Gastrointestinal system: Abdomen is nondistended, soft and nontender. Central nervous system: Alert and oriented Extremities: No edema, no clubbing ,no cyanosis, left BKA,  wound VAC Skin: No rashes, no ulcers,no icterus      Data Reviewed: I have personally reviewed following labs and imaging studies  CBC: Recent Labs  Lab 10/28/20 0424 10/29/20 0322 10/30/20 0715 11/02/20 0345 11/03/20 0425  WBC 12.7* 13.7* 11.9* 9.1 8.9  NEUTROABS  --   --   --   --  6.4  HGB 10.5* 8.6* 8.4* 7.5* 8.0*  HCT 32.9* 27.8* 27.1* 24.0* 25.3*  MCV 84.4 85.0 84.2 84.8 84.3  PLT 405* 355 376 374 [id number]   Basic Metabolic Panel: Recent Labs  Lab 10/29/20 0322 10/30/20 0715 11/01/20 0413 11/02/20 0345 11/03/20 0425  NA 129* 131* 131* 132* 131*  K 4.8 4.9 5.0 5.2* 5.1  CL 101 103 103 101 101  CO2 19* 21* 20* 20* 20*  GLUCOSE 283* 90 122* 95  108*  BUN 48* 54* 79* 85* 84*  CREATININE 2.83* 2.99* 4.10* 4.91* 4.25*  CALCIUM 8.5* 8.8* 9.0 9.2 9.0  PHOS  --   --   --  5.9* 5.4*   GFR: Estimated Creatinine Clearance: 26 mL/min (A) (by C-G formula based on SCr of 4.25 mg/dL (H)). Liver Function Tests: Recent Labs  Lab 11/02/20 0345 11/03/20 0425  ALBUMIN 2.3* 2.4*   No results for input(s): LIPASE, AMYLASE in the last 168 hours. No results for input(s): AMMONIA in the last 168 hours. Coagulation Profile: No results for input(s): INR, PROTIME in the last 168 hours. Cardiac Enzymes: No results for input(s): CKTOTAL, CKMB, CKMBINDEX, TROPONINI in the last 168 hours. BNP (last 3 results) No results for input(s): PROBNP in the last 8760 hours. HbA1C: No results for input(s): HGBA1C in the last 72 hours. CBG: Recent Labs  Lab 11/02/20 0639 11/02/20 1159 11/02/20 1739 11/02/20 2038 11/03/20 0644  GLUCAP 87 76 72 114* 83   Lipid Profile: No results for input(s): CHOL, HDL, LDLCALC, TRIG, CHOLHDL, LDLDIRECT in the last 72 hours. Thyroid Function Tests: No results for input(s): TSH, T4TOTAL, FREET4, T3FREE, THYROIDAB in the last 72 hours. Anemia Panel: No results for input(s): VITAMINB12, FOLATE, FERRITIN, TIBC, IRON, RETICCTPCT in the last 72 hours. Sepsis  Labs: No results for input(s): PROCALCITON, LATICACIDVEN in the last 168 hours.  Recent Results (from the past 240 hour(s))  Surgical pcr screen     Status: None   Collection Time: 10/27/20  9:52 AM   Specimen: Nasal Mucosa; Nasal Swab  Result Value Ref Range Status   MRSA, PCR NEGATIVE NEGATIVE Final   Staphylococcus aureus NEGATIVE NEGATIVE Final    Comment: (NOTE) The Xpert SA Assay (FDA approved for NASAL specimens in patients 81 years of age and older), is one component of a comprehensive surveillance program. It is not intended to diagnose infection nor to guide or monitor treatment. Performed at [facility], [place] [address], [place], [place] [address]          Radiology Studies: US RENAL  Result Date: 11/01/2020 CLINICAL DATA:  55 year old male with acute renal insufficiency. EXAM: RENAL / URINARY TRACT ULTRASOUND COMPLETE COMPARISON:  Renal ultrasound dated 03/23/2020. FINDINGS: Right Kidney: Renal measurements: 11.9 x 4.6 x 4.7 cm = volume: 135 mL. Mild increased echogenicity. No hydronephrosis or shadowing stone. Left Kidney: Renal measurements: 10.8 x 5.5 x 4 8 cm = volume: 150 mL. Mild increased echogenicity. No hydronephrosis or shadowing stone. Bladder: Appears normal for degree of bladder distention. Other: None. IMPRESSION: Mildly echogenic kidneys may represent medical renal disease. No hydronephrosis or shadowing stone. Electronically Signed   By: Anner Crete M.D.   On: 11/01/2020 21:29   IR Fluoro Guide CV Line Right  Result Date: 11/02/2020 INDICATION: 49 year old with acute kidney injury. EXAM: FLUOROSCOPIC AND ULTRASOUND GUIDED PLACEMENT OF A TUNNELED DIALYSIS CATHETER Physician: Stephan Minister. Anselm Pancoast, MD MEDICATIONS: Ancef 2 g; The antibiotic was administered within an appropriate time interval prior to skin puncture. ANESTHESIA/SEDATION: Versed 0.5 mg IV; Fentanyl 25 mcg IV; Moderate Sedation Time:  30 minutes The patient was continuously monitored during the  procedure by the interventional radiology nurse under my direct supervision. FLUOROSCOPY TIME:  Fluoroscopy Time: 6 seconds, 4 mGy COMPLICATIONS: None immediate. PROCEDURE: The procedure was explained to the patient. The risks and benefits of the procedure were discussed and the patient's questions were addressed. Informed consent was obtained from the patient. The patient was placed supine on the interventional  table. Ultrasound confirmed a patent right internal jugular vein. Ultrasound image obtained for documentation. The right neck and chest was prepped and draped in a sterile fashion. Maximal barrier sterile technique was utilized including caps, mask, sterile gowns, sterile gloves, sterile drape, hand hygiene and skin antiseptic. The right neck was anesthetized with 1% lidocaine. A small incision was made with #11 blade scalpel. A 21 gauge needle directed into the right internal jugular vein with ultrasound guidance. A micropuncture dilator set was placed. A 23 cm tip to cuff Palindrome catheter was selected. The skin below the right clavicle was anesthetized and a small incision was made with an #11 blade scalpel. A subcutaneous tunnel was formed to the vein dermatotomy site. The catheter was brought through the tunnel. The vein dermatotomy site was dilated to accommodate a peel-away sheath. The catheter was placed through the peel-away sheath and directed into the central venous structures. The tip of the catheter was placed at superior cavoatrial junction with fluoroscopy. Fluoroscopic images were obtained for documentation. Both lumens were found to aspirate and flush well. The proper amount of heparin was flushed in both lumens. The vein dermatotomy site was closed using a single layer of absorbable suture and Dermabond. Gel-Foam was placed in subcutaneous tract. The catheter was secured to the skin using Prolene suture. IMPRESSION: Successful placement of a right jugular tunneled dialysis catheter using  ultrasound and fluoroscopic guidance. Electronically Signed   By: Markus Daft M.D.   On: 11/02/2020 17:57   IR US Guide Vasc Access Right  Result Date: 11/02/2020 INDICATION: 56 year old with acute kidney injury. EXAM: FLUOROSCOPIC AND ULTRASOUND GUIDED PLACEMENT OF A TUNNELED DIALYSIS CATHETER Physician: Stephan Minister. Anselm Pancoast, MD MEDICATIONS: Ancef 2 g; The antibiotic was administered within an appropriate time interval prior to skin puncture. ANESTHESIA/SEDATION: Versed 0.5 mg IV; Fentanyl 25 mcg IV; Moderate Sedation Time:  30 minutes The patient was continuously monitored during the procedure by the interventional radiology nurse under my direct supervision. FLUOROSCOPY TIME:  Fluoroscopy Time: 6 seconds, 4 mGy COMPLICATIONS: None immediate. PROCEDURE: The procedure was explained to the patient. The risks and benefits of the procedure were discussed and the patient's questions were addressed. Informed consent was obtained from the patient. The patient was placed supine on the interventional table. Ultrasound confirmed a patent right internal jugular vein. Ultrasound image obtained for documentation. The right neck and chest was prepped and draped in a sterile fashion. Maximal barrier sterile technique was utilized including caps, mask, sterile gowns, sterile gloves, sterile drape, hand hygiene and skin antiseptic. The right neck was anesthetized with 1% lidocaine. A small incision was made with #11 blade scalpel. A 21 gauge needle directed into the right internal jugular vein with ultrasound guidance. A micropuncture dilator set was placed. A 23 cm tip to cuff Palindrome catheter was selected. The skin below the right clavicle was anesthetized and a small incision was made with an #11 blade scalpel. A subcutaneous tunnel was formed to the vein dermatotomy site. The catheter was brought through the tunnel. The vein dermatotomy site was dilated to accommodate a peel-away sheath. The catheter was placed through the  peel-away sheath and directed into the central venous structures. The tip of the catheter was placed at superior cavoatrial junction with fluoroscopy. Fluoroscopic images were obtained for documentation. Both lumens were found to aspirate and flush well. The proper amount of heparin was flushed in both lumens. The vein dermatotomy site was closed using a single layer of absorbable suture and Dermabond. Gel-Foam was  placed in subcutaneous tract. The catheter was secured to the skin using Prolene suture. IMPRESSION: Successful placement of a right jugular tunneled dialysis catheter using ultrasound and fluoroscopic guidance. Electronically Signed   By: Markus Daft M.D.   On: 11/02/2020 17:57        Scheduled Meds:  allopurinol  100 mg Oral Daily   vitamin C  1,000 mg Oral Daily   aspirin EC  81 mg Oral Daily   atorvastatin  80 mg Oral Daily   bictegravir-emtricitabine-tenofovir AF  1 tablet Oral Daily   Chlorhexidine Gluconate Cloth  6 each Topical Q0600   dapsone  100 mg Oral Daily   escitalopram  20 mg Oral Daily   gabapentin  100 mg Oral TID   heparin injection (subcutaneous)  5,000 Units Subcutaneous Q8H   icosapent Ethyl  2 g Oral BID   insulin aspart  0-15 Units Subcutaneous TID WC   insulin aspart  0-5 Units Subcutaneous QHS   insulin glargine  35 Units Subcutaneous Daily   nutrition supplement (JUVEN)  1 packet Oral BID BM   oxybutynin  2.5 mg Oral BID   pantoprazole  40 mg Oral Daily   sodium bicarbonate  650 mg Oral BID   sodium zirconium cyclosilicate  10 g Oral Daily   ticagrelor  90 mg Oral BID   zinc sulfate  220 mg Oral Daily   Continuous Infusions:   LOS: 16 days    Time spent: 25 mins.More than 50% of that time was spent in counseling and/or coordination of care.      Shelly Coss, MD [facility] P7/29/2022, 8:04 AM

## 2020-11-04 DIAGNOSIS — M86172 Other acute osteomyelitis, left ankle and foot: Secondary | ICD-10-CM | POA: Diagnosis not present

## 2020-11-04 LAB — GLUCOSE, CAPILLARY
Glucose-Capillary: 66 mg/dL — ABNORMAL LOW (ref 70–99)
Glucose-Capillary: 66 mg/dL — ABNORMAL LOW (ref 70–99)
Glucose-Capillary: 109 mg/dL — ABNORMAL HIGH (ref 70–99)
Glucose-Capillary: 151 mg/dL — ABNORMAL HIGH (ref 70–99)
Glucose-Capillary: 88 mg/dL (ref 70–99)
Glucose-Capillary: 174 mg/dL — ABNORMAL HIGH (ref 70–99)

## 2020-11-04 LAB — RENAL FUNCTION PANEL
Albumin: 2.5 g/dL — ABNORMAL LOW (ref 3.5–5.0)
Anion gap: 10 (ref 5–15)
BUN: 88 mg/dL — ABNORMAL HIGH (ref 6–20)
CO2: 19 mmol/L — ABNORMAL LOW (ref 22–32)
Calcium: 9.1 mg/dL (ref 8.9–10.3)
Chloride: 103 mmol/L (ref 98–111)
Creatinine, Ser: 4.17 mg/dL — ABNORMAL HIGH (ref 0.61–1.24)
GFR, Estimated: 16 mL/min — ABNORMAL LOW (ref >60)
Glucose, Bld: 108 mg/dL — ABNORMAL HIGH (ref 70–99)
Phosphorus: 5.5 mg/dL — ABNORMAL HIGH (ref 2.5–4.6)
Potassium: 6.1 mmol/L — ABNORMAL HIGH (ref 3.5–5.1)
Sodium: 132 mmol/L — ABNORMAL LOW (ref 135–145)

## 2020-11-04 LAB — BASIC METABOLIC PANEL
Anion gap: 10 (ref 5–15)
BUN: 82 mg/dL — ABNORMAL HIGH (ref 6–20)
CO2: 19 mmol/L — ABNORMAL LOW (ref 22–32)
Calcium: 9.3 mg/dL (ref 8.9–10.3)
Chloride: 101 mmol/L (ref 98–111)
Creatinine, Ser: 3.74 mg/dL — ABNORMAL HIGH (ref 0.61–1.24)
GFR, Estimated: 18 mL/min — ABNORMAL LOW (ref >60)
Glucose, Bld: 127 mg/dL — ABNORMAL HIGH (ref 70–99)
Potassium: 5.7 mmol/L — ABNORMAL HIGH (ref 3.5–5.1)
Sodium: 130 mmol/L — ABNORMAL LOW (ref 135–145)

## 2020-11-04 MED ORDER — SODIUM ZIRCONIUM CYCLOSILICATE 10 G PO PACK
10.0000 g | PACK | Freq: Two times a day (BID) | ORAL | Status: DC
  Administered 2020-11-04 – 2020-11-06 (×6): 10 g via ORAL
  Filled 2020-11-04 (×6): qty 1

## 2020-11-04 MED ORDER — DEXTROSE 50 % IV SOLN: 1.0000 | Freq: Once | INTRAVENOUS | Status: DC

## 2020-11-04 MED ORDER — GLUCOSE 40 % PO GEL
ORAL | Status: AC
  Filled 2020-11-04: qty 1

## 2020-11-04 MED ORDER — GLUCOSE 40 % PO GEL
1.0000 | ORAL | Status: AC
  Administered 2020-11-04: 31 g via ORAL

## 2020-11-04 MED ORDER — INSULIN ASPART 100 UNIT/ML IJ SOLN: 10.0000 [IU] | Freq: Once | INTRAMUSCULAR | Status: DC

## 2020-11-04 MED ORDER — CALCIUM GLUCONATE-NACL 1-0.675 GM/50ML-% IV SOLN
1.0000 g | Freq: Once | INTRAVENOUS | Status: AC
  Administered 2020-11-04: 1000 mg via INTRAVENOUS
  Filled 2020-11-04: qty 50

## 2020-11-04 NOTE — Progress Notes (Signed)
[facility] Progress Note   Assessment/Plan: Keith Keith [name] is a 54 year old male with PMHx of HIV, CKDIIIb, hypertension, CAD s/p stent, type II diabetes mellitus s/p R BKA admitted for left diabetic heel ulcer concerning for osteomyelitis s/p L BKA on 7/22. Hospital course complicated by AKI on CKD.  AKI on CKDIIIb - baseline sCr ~2.5; noted to be worsening during hospital stay. sCr 4.9>4.25 this morning. 675cc urine output over past 24 hours; Renal ultrasound negative for hydronephrosis, consistent with chronic renal disease. C3/C4 complement wnl. Hepatitis panel negative. Urine studies pending. ANA, anti-DNA, GBM antibodies pending. Patient had tunneled HD cath placed yesterday.  - Given no worsening of renal function will hold off on HD today.  - Will continue to trend renal function. - UOP decent (8106m/24hr) - Will leave RIJ TC in place for now in case he goes the wrong direction. Most likely  ATN 2/2 hypotension.  Hyperkalemia - likely 2/2 acute renal failure - Will give Lokelma and try to avoid HD. Non-anion gap acidosis - secondary to worsening renal function as above, continue sodium bicarb.  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  Hypertension: has continued to have soft BP's. Normocytic anemia - continue to monitor   LDwana Melena MD 11/04/2020, 7:05 AM  Subjective:   HD 17 Overnight, no acute events reported.  Keith MCambryn Leazenbywas evaluated at bedside this morning. He is resting comfortably in bed; no acute concerns at this time. Discussed that his renal function only slightly improved c/w yest but K incr. He expresses understanding. All questions and concerns addressed. He denies obstructive symptoms/ f/c/n/v/ dyspnea.  Objective Vitals:   11/03/20 0928 11/03/20 1642 11/03/20 2148 11/04/20 0441   BP: 125/76 98/68 112/67 126/85  Pulse: 67 68 64 68  Resp: '18 17 17 18  '$ Temp: 98.2 F (36.8 C) 97.6 F (36.4 C) (!) 97.4 F (36.3 C) 97.9 F (36.6 C)  TempSrc: Oral Oral Oral Oral  SpO2: 94% 95% 95% 97%  Weight:      Height:       Physical Exam General: middle aged male, resting comfortably in bed, no acute distress Heart: RRR, S1 and S2 present  Lungs: CTAB on RA;  Abdomen: soft, nondistended, nontender, +BS Extremities: bilateral BKA, wound vac on LLE  Dialysis Access: tunneled HD cath R IJ   Additional Objective Labs: Basic Metabolic Panel: Recent Labs  Lab 11/02/20 0345 11/03/20 0425 11/04/20 0124  NA 132* 131* 132*  K 5.2* 5.1 6.1*  CL 101 101 103  CO2 20* 20* 19*  GLUCOSE 95 108* 108*  BUN 85* 84* 88*  CREATININE 4.91* 4.25* 4.17*  CALCIUM 9.2 9.0 9.1  PHOS 5.9* 5.4* 5.5*   Liver Function Tests: Recent Labs  Lab 11/02/20 0345 11/03/20 0425 11/04/20 0124  ALBUMIN 2.3* 2.4* 2.5*   No results for input(s): LIPASE, AMYLASE in the last 168 hours. CBC: Recent Labs  Lab 10/29/20 0322 10/30/20 0715 11/02/20 0345 11/03/20 0425  WBC 13.7* 11.9* 9.1 8.9  NEUTROABS  --   --   --  6.4  HGB 8.6* 8.4* 7.5* 8.0*  HCT 27.8* 27.1* 24.0* 25.3*  MCV 85.0 84.2 84.8 84.3  PLT 355 376 374 364   Blood Culture    Component Value Date/Time   SDES BLOOD RIGHT WRIST 10/18/2020 [place]  10/18/2020 0439    BOTTLES DRAWN AEROBIC AND ANAEROBIC Blood Culture results may not be optimal due to an inadequate volume of blood received in culture bottles   CULT  10/18/2020 0439    NO GROWTH 5 DAYS Performed at [facility], [place] [address] [place], [place] [address]    REPTSTATUS 10/23/2020 FINAL 10/18/2020 0439    Cardiac Enzymes: No results for input(s): CKTOTAL, CKMB, CKMBINDEX, TROPONINI in the last 168 hours. CBG: Recent Labs  Lab 11/03/20 0644 11/03/20 1159 11/03/20 1640 11/03/20 2216 11/04/20 0646  GLUCAP 83 87 109* 152* 66*   Iron  Studies: No results for input(s): IRON, TIBC, TRANSFERRIN, FERRITIN in the last 72 hours.  Studies/Results: IR Fluoro Guide CV Line Right  Result Date: 11/02/2020 INDICATION: 72 year old with acute kidney injury. EXAM: FLUOROSCOPIC AND ULTRASOUND GUIDED PLACEMENT OF A TUNNELED DIALYSIS CATHETER Physician: Stephan Minister. Anselm Pancoast, MD MEDICATIONS: Ancef 2 g; The antibiotic was administered within an appropriate time interval prior to skin puncture. ANESTHESIA/SEDATION: Versed 0.5 mg IV; Fentanyl 25 mcg IV; Moderate Sedation Time:  30 minutes The patient was continuously monitored during the procedure by the interventional radiology nurse under my direct supervision. FLUOROSCOPY TIME:  Fluoroscopy Time: 6 seconds, 4 mGy COMPLICATIONS: None immediate. PROCEDURE: The procedure was explained to the patient. The risks and benefits of the procedure were discussed and the patient's questions were addressed. Informed consent was obtained from the patient. The patient was placed supine on the interventional table. Ultrasound confirmed a patent right internal jugular vein. Ultrasound image obtained for documentation. The right neck and chest was prepped and draped in a sterile fashion. Maximal barrier sterile technique was utilized including caps, mask, sterile gowns, sterile gloves, sterile drape, hand hygiene and skin antiseptic. The right neck was anesthetized with 1% lidocaine. A small incision was made with #11 blade scalpel. A 21 gauge needle directed into the right internal jugular vein with ultrasound guidance. A micropuncture dilator set was placed. A 23 cm tip to cuff Palindrome catheter was selected. The skin below the right clavicle was anesthetized and a small incision was made with an #11 blade scalpel. A subcutaneous tunnel was formed to the vein dermatotomy site. The catheter was brought through the tunnel. The vein dermatotomy site was dilated to accommodate a peel-away sheath. The catheter was placed through the  peel-away sheath and directed into the central venous structures. The tip of the catheter was placed at superior cavoatrial junction with fluoroscopy. Fluoroscopic images were obtained for documentation. Both lumens were found to aspirate and flush well. The proper amount of heparin was flushed in both lumens. The vein dermatotomy site was closed using a single layer of absorbable suture and Dermabond. Gel-Foam was placed in subcutaneous tract. The catheter was secured to the skin using Prolene suture. IMPRESSION: Successful placement of a right jugular tunneled dialysis catheter using ultrasound and fluoroscopic guidance. Electronically Signed   By: Markus Daft M.D.   On: 11/02/2020 17:57   IR US Guide Vasc Access Right  Result Date: 11/02/2020 INDICATION: 16 year old with acute kidney injury. EXAM: FLUOROSCOPIC AND ULTRASOUND GUIDED PLACEMENT OF A TUNNELED DIALYSIS CATHETER Physician: Stephan Minister. Anselm Pancoast, MD MEDICATIONS: Ancef 2 g; The antibiotic was administered within an appropriate time interval prior to skin puncture. ANESTHESIA/SEDATION: Versed 0.5 mg IV; Fentanyl 25 mcg IV; Moderate Sedation Time:  30 minutes The patient was continuously monitored during the procedure by the interventional radiology nurse under my direct supervision. FLUOROSCOPY TIME:  Fluoroscopy Time: 6 seconds, 4 mGy  COMPLICATIONS: None immediate. PROCEDURE: The procedure was explained to the patient. The risks and benefits of the procedure were discussed and the patient's questions were addressed. Informed consent was obtained from the patient. The patient was placed supine on the interventional table. Ultrasound confirmed a patent right internal jugular vein. Ultrasound image obtained for documentation. The right neck and chest was prepped and draped in a sterile fashion. Maximal barrier sterile technique was utilized including caps, mask, sterile gowns, sterile gloves, sterile drape, hand hygiene and skin antiseptic. The right neck was  anesthetized with 1% lidocaine. A small incision was made with #11 blade scalpel. A 21 gauge needle directed into the right internal jugular vein with ultrasound guidance. A micropuncture dilator set was placed. A 23 cm tip to cuff Palindrome catheter was selected. The skin below the right clavicle was anesthetized and a small incision was made with an #11 blade scalpel. A subcutaneous tunnel was formed to the vein dermatotomy site. The catheter was brought through the tunnel. The vein dermatotomy site was dilated to accommodate a peel-away sheath. The catheter was placed through the peel-away sheath and directed into the central venous structures. The tip of the catheter was placed at superior cavoatrial junction with fluoroscopy. Fluoroscopic images were obtained for documentation. Both lumens were found to aspirate and flush well. The proper amount of heparin was flushed in both lumens. The vein dermatotomy site was closed using a single layer of absorbable suture and Dermabond. Gel-Foam was placed in subcutaneous tract. The catheter was secured to the skin using Prolene suture. IMPRESSION: Successful placement of a right jugular tunneled dialysis catheter using ultrasound and fluoroscopic guidance. Electronically Signed   By: Markus Daft M.D.   On: 11/02/2020 17:57   Medications:   allopurinol  100 mg Oral Daily   vitamin C  1,000 mg Oral Daily   aspirin EC  81 mg Oral Daily   atorvastatin  80 mg Oral Daily   bictegravir-emtricitabine-tenofovir AF  1 tablet Oral Daily   Chlorhexidine Gluconate Cloth  6 each Topical Q0600   dapsone  100 mg Oral Daily   dextrose       escitalopram  20 mg Oral Daily   gabapentin  100 mg Oral TID   heparin injection (subcutaneous)  5,000 Units Subcutaneous Q8H   icosapent Ethyl  2 g Oral BID   insulin aspart  0-15 Units Subcutaneous TID WC   insulin aspart  0-5 Units Subcutaneous QHS   insulin glargine  35 Units Subcutaneous Daily   nutrition supplement (JUVEN)  1  packet Oral BID BM   oxybutynin  2.5 mg Oral BID   pantoprazole  40 mg Oral Daily   sodium bicarbonate  650 mg Oral BID   sodium zirconium cyclosilicate  10 g Oral Daily   ticagrelor  90 mg Oral BID   zinc sulfate  220 mg Oral Daily

## 2020-11-04 NOTE — Progress Notes (Signed)
Hypoglycemic Event  CBG: 66  Treatment: 1 tube glucose gel  Symptoms: None  Follow-up CBG: Time:07:16 CBG Result:66  Possible Reasons for Event: Unknown  Comments/MD notified:Per hypoglycemia protocol    Myriam Brandhorst Joselita

## 2020-11-04 NOTE — Progress Notes (Signed)
PROGRESS NOTE    Keith [name]  [id number] DOB: 1965-06-14 DOA: 10/17/2020 [name], NP   Chief Complain:left heel ulcer  Brief Narrative:  Patient is a  55 year old male with past medical history of HIV, chronic kidney disease stage IIIb, type 2 diabetes mellitus, hypertension, hyperlipidemia, coronary artery disease, status post right below knee amputation who was sent to the emergency department by his orthopedic surgeon due to concern for left heel ulcer that has failed outpatient therapy.  MRI confirmed osteomyelitis.  He underwent left transtibial amputation by [name].  PT/OT recommended skilled nursing facility on discharge.  TOC consulted and following for disposition. Nephrology consulted  for worsening kidney function.  Underwent  temporary dialysis catheter placement for possible need of dialysis .  Nephrology closely following  Assessment & Plan:   Active Problems:   Elevated blood pressure reading with diagnosis of hypertension   Cellulitis   Ulcer of heel due to diabetes mellitus (HCC)   Renal insufficiency   Normochromic normocytic anemia   Cellulitis of left foot   Acute osteomyelitis of left calcaneus (HCC)   1.  AKI on Chronic kidney disease stage IIIb-IV/non-anion gap metabolic acidosis Baseline creatinine around 2.5.  Creatinine bumped up to the range of 4 and creeped up.    Also started on sodium bicarb tablets for non-anion gap metabolic acidosis.  .   Underwent temporary dialysis catheter placement,plan for  dialysis initiation but holding off for now.  There was also plan for kidney biopsy, will defer to nephrology.   ANA,antiDNA, GBM antibodies pending.Hepatitis panel negative.  C4 level normal, C3 mildly elevated Renal ultrasound negative for hydronephrosis, consistent with chronic renal disease.  AKI thought to be secondary to ATN secondary to hypotension He is hyperkalemic today with potassium of 6.1, will continue Lokelma, also given  calcium gluconate.  Will check BMP later today. He does not have a nephrologist established yet as an outpatient  2. left foot ulcer Patient failed outpatient therapy with doxycycline Osteomyelitis was confirmed by MRI. Patient underwent left BKA October 27, 2020, has wound [facility] Orthopedic were following, recommend outpatient follow-up.  3.  HIV stable Continue HAART regimen  4.  Diabetic nephropathy/diabetic neuropathy Continue home medications, on gabapentin  5.  Severely uncontrolled diabetes mellitus with hyperglycemia  last hemoglobin A1c was 14 Hospital course remarkable for low blood sugars.  Lantus on hold currently.  Currently only on sliding scale insulin. Likely noncompliant on insulin.  We recommend to continue previous insulin regimen on discharge  6.  Ambulatory dysfunction secondary to bilateral amputation PT /OT following.  Recommended skilled nursing facility on discharge.  TOC consulted  7.  Hypertension Antihypertensives D/Ced .BP soft but stable  8.  Normocytic anemia Hemoglobin currently stable in the range of 7-8.  Most likely associated  with chronic kidney disease.  9.  Morbid obesity BMI of 39         DVT prophylaxis:Heparin [place] Code Status: Full Family Communication: None at bedside Status is: Inpatient  Remains inpatient appropriate because:Unsafe d/c plan  Dispo:  Patient From:    Planned Disposition: [place]  Medically stable for discharge:      Needs to get clearance from nephrology for discharge because of ongoing AKI, possible need for dialysis.  Consultants: Orthopedics  Procedures:Left BKA  Antimicrobials:  Anti-infectives (From admission, onward)    Start     Dose/Rate Route Frequency Ordered Stop   11/02/20 1130  ceFAZolin (ANCEF) IVPB 2g/100 mL premix  2 g 200 mL/hr over 30 Minutes Intravenous To Radiology 11/02/20 1042 11/02/20 1704   10/27/20 1245  ceFAZolin (ANCEF) IVPB 3g/100 mL premix        3  g 200 mL/hr over 30 Minutes Intravenous On call to O.R. 10/27/20 0755 10/27/20 1608   10/20/20 1415  linezolid (ZYVOX) tablet 600 mg  Status:  Discontinued        600 mg Oral Every 12 hours 10/20/20 1326 10/28/20 1115   10/19/20 1445  metroNIDAZOLE (FLAGYL) tablet 500 mg  Status:  Discontinued        500 mg Oral Every 12 hours 10/19/20 1347 10/28/20 1115   10/18/20 1600  ceFEPIme (MAXIPIME) 2 g in sodium chloride 0.9 % 100 mL IVPB  Status:  Discontinued        2 g 200 mL/hr over 30 Minutes Intravenous Every 12 hours 10/18/20 0414 10/28/20 1112   10/18/20 1000  dapsone tablet 100 mg        100 mg Oral Daily 10/18/20 0425     10/18/20 1000  bictegravir-emtricitabine-tenofovir AF (BIKTARVY) 50-200-25 MG per tablet 1 tablet        1 tablet Oral Daily 10/18/20 0610     10/18/20 0415  metroNIDAZOLE (FLAGYL) IVPB 500 mg  Status:  Discontinued        500 mg 100 mL/hr over 60 Minutes Intravenous Every 8 hours 10/18/20 0404 10/19/20 1347   10/18/20 0415  ceFEPIme (MAXIPIME) 2 g in sodium chloride 0.9 % 100 mL IVPB        2 g 200 mL/hr over 30 Minutes Intravenous  Once 10/18/20 0414 10/18/20 0500   10/18/20 0345  vancomycin (VANCOREADY) IVPB 2000 mg/400 mL        2,000 mg 200 mL/hr over 120 Minutes Intravenous  Once 10/18/20 0335 10/18/20 [id number]       Subjective:  Patient seen and examined at the bedside today.  Hemodynamically stable.   Hypoglycemic this morning.  Comfortable during my evaluation and denied any complaints  Objective: Vitals:   11/03/20 1642 11/03/20 2148 11/04/20 0441 11/04/20 1012  BP: 98/68 112/67 126/85 114/74  Pulse: 68 64 68 65  Resp: '17 17 18 20  '$ Temp: 97.6 F (36.4 C) (!) 97.4 F (36.3 C) 97.9 F (36.6 C) 98 F (36.7 C)  TempSrc: Oral Oral Oral Oral  SpO2: 95% 95% 97% 97%  Weight:      Height:        Intake/Output Summary (Last 24 hours) at 11/04/2020 1147 Last data filed at 11/04/2020 0745 Gross per 24 hour  Intake 1100 ml  Output 300 ml  Net 800 ml    Filed Weights   10/20/20 2107 10/22/20 0525 10/26/20 2221  Weight: 128 kg 124.8 kg 124.8 kg    Examination:  General exam: Overall comfortable, not in distress,obese HEENT: PERRL Respiratory system:  no wheezes or crackles  Cardiovascular system: S1 & S2 heard, RRR.  Tunneled dialysis catheter on the right chest Gastrointestinal system: Abdomen is nondistended, soft and nontender. Central nervous system: Alert and oriented Extremities: No edema, no clubbing ,no cyanosis, left BKA/wound VAC Skin: No rashes, no ulcers,no icterus      Data Reviewed: I have personally reviewed following labs and imaging studies  CBC: Recent Labs  Lab 10/29/20 0322 10/30/20 0715 11/02/20 0345 11/03/20 0425  WBC 13.7* 11.9* 9.1 8.9  NEUTROABS  --   --   --  6.4  HGB 8.6* 8.4* 7.5* 8.0*  HCT 27.8* 27.1* 24.0*  25.3*  MCV 85.0 84.2 84.8 84.3  PLT 355 376 374 [id number]   Basic Metabolic Panel: Recent Labs  Lab 10/30/20 0715 11/01/20 0413 11/02/20 0345 11/03/20 0425 11/04/20 0124  NA 131* 131* 132* 131* 132*  K 4.9 5.0 5.2* 5.1 6.1*  CL 103 103 101 101 103  CO2 21* 20* 20* 20* 19*  GLUCOSE 90 122* 95 108* 108*  BUN 54* 79* 85* 84* 88*  CREATININE 2.99* 4.10* 4.91* 4.25* 4.17*  CALCIUM 8.8* 9.0 9.2 9.0 9.1  PHOS  --   --  5.9* 5.4* 5.5*   GFR: Estimated Creatinine Clearance: 26.5 mL/min (A) (by C-G formula based on SCr of 4.17 mg/dL (H)). Liver Function Tests: Recent Labs  Lab 11/02/20 0345 11/03/20 0425 11/04/20 0124  ALBUMIN 2.3* 2.4* 2.5*   No results for input(s): LIPASE, AMYLASE in the last 168 hours. No results for input(s): AMMONIA in the last 168 hours. Coagulation Profile: No results for input(s): INR, PROTIME in the last 168 hours. Cardiac Enzymes: No results for input(s): CKTOTAL, CKMB, CKMBINDEX, TROPONINI in the last 168 hours. BNP (last 3 results) No results for input(s): PROBNP in the last 8760 hours. HbA1C: No results for input(s): HGBA1C in the last 72  hours. CBG: Recent Labs  Lab 11/03/20 1640 11/03/20 2216 11/04/20 0646 11/04/20 0716 11/04/20 0749  GLUCAP 109* 152* 66* 66* 109*   Lipid Profile: No results for input(s): CHOL, HDL, LDLCALC, TRIG, CHOLHDL, LDLDIRECT in the last 72 hours. Thyroid Function Tests: No results for input(s): TSH, T4TOTAL, FREET4, T3FREE, THYROIDAB in the last 72 hours. Anemia Panel: No results for input(s): VITAMINB12, FOLATE, FERRITIN, TIBC, IRON, RETICCTPCT in the last 72 hours. Sepsis Labs: No results for input(s): PROCALCITON, LATICACIDVEN in the last 168 hours.  Recent Results (from the past 240 hour(s))  Surgical pcr screen     Status: None   Collection Time: 10/27/20  9:52 AM   Specimen: Nasal Mucosa; Nasal Swab  Result Value Ref Range Status   MRSA, PCR NEGATIVE NEGATIVE Final   Staphylococcus aureus NEGATIVE NEGATIVE Final    Comment: (NOTE) The Xpert SA Assay (FDA approved for NASAL specimens in patients 8 years of age and older), is one component of a comprehensive surveillance program. It is not intended to diagnose infection nor to guide or monitor treatment. Performed at [facility], [place] [address], [place], [place] [address]          Radiology Studies: IR Fluoro Guide CV Line Right  Result Date: 11/02/2020 INDICATION: 74 year old with acute kidney injury. EXAM: FLUOROSCOPIC AND ULTRASOUND GUIDED PLACEMENT OF A TUNNELED DIALYSIS CATHETER Physician: Stephan Minister. Anselm Pancoast, MD MEDICATIONS: Ancef 2 g; The antibiotic was administered within an appropriate time interval prior to skin puncture. ANESTHESIA/SEDATION: Versed 0.5 mg IV; Fentanyl 25 mcg IV; Moderate Sedation Time:  30 minutes The patient was continuously monitored during the procedure by the interventional radiology nurse under my direct supervision. FLUOROSCOPY TIME:  Fluoroscopy Time: 6 seconds, 4 mGy COMPLICATIONS: None immediate. PROCEDURE: The procedure was explained to the patient. The risks and benefits of the procedure  were discussed and the patient's questions were addressed. Informed consent was obtained from the patient. The patient was placed supine on the interventional table. Ultrasound confirmed a patent right internal jugular vein. Ultrasound image obtained for documentation. The right neck and chest was prepped and draped in a sterile fashion. Maximal barrier sterile technique was utilized including caps, mask, sterile gowns, sterile gloves, sterile drape, hand hygiene and skin antiseptic.  The right neck was anesthetized with 1% lidocaine. A small incision was made with #11 blade scalpel. A 21 gauge needle directed into the right internal jugular vein with ultrasound guidance. A micropuncture dilator set was placed. A 23 cm tip to cuff Palindrome catheter was selected. The skin below the right clavicle was anesthetized and a small incision was made with an #11 blade scalpel. A subcutaneous tunnel was formed to the vein dermatotomy site. The catheter was brought through the tunnel. The vein dermatotomy site was dilated to accommodate a peel-away sheath. The catheter was placed through the peel-away sheath and directed into the central venous structures. The tip of the catheter was placed at superior cavoatrial junction with fluoroscopy. Fluoroscopic images were obtained for documentation. Both lumens were found to aspirate and flush well. The proper amount of heparin was flushed in both lumens. The vein dermatotomy site was closed using a single layer of absorbable suture and Dermabond. Gel-Foam was placed in subcutaneous tract. The catheter was secured to the skin using Prolene suture. IMPRESSION: Successful placement of a right jugular tunneled dialysis catheter using ultrasound and fluoroscopic guidance. Electronically Signed   By: Markus Daft M.D.   On: 11/02/2020 17:57   IR US Guide Vasc Access Right  Result Date: 11/02/2020 INDICATION: 75 year old with acute kidney injury. EXAM: FLUOROSCOPIC AND ULTRASOUND GUIDED  PLACEMENT OF A TUNNELED DIALYSIS CATHETER Physician: Stephan Minister. Anselm Pancoast, MD MEDICATIONS: Ancef 2 g; The antibiotic was administered within an appropriate time interval prior to skin puncture. ANESTHESIA/SEDATION: Versed 0.5 mg IV; Fentanyl 25 mcg IV; Moderate Sedation Time:  30 minutes The patient was continuously monitored during the procedure by the interventional radiology nurse under my direct supervision. FLUOROSCOPY TIME:  Fluoroscopy Time: 6 seconds, 4 mGy COMPLICATIONS: None immediate. PROCEDURE: The procedure was explained to the patient. The risks and benefits of the procedure were discussed and the patient's questions were addressed. Informed consent was obtained from the patient. The patient was placed supine on the interventional table. Ultrasound confirmed a patent right internal jugular vein. Ultrasound image obtained for documentation. The right neck and chest was prepped and draped in a sterile fashion. Maximal barrier sterile technique was utilized including caps, mask, sterile gowns, sterile gloves, sterile drape, hand hygiene and skin antiseptic. The right neck was anesthetized with 1% lidocaine. A small incision was made with #11 blade scalpel. A 21 gauge needle directed into the right internal jugular vein with ultrasound guidance. A micropuncture dilator set was placed. A 23 cm tip to cuff Palindrome catheter was selected. The skin below the right clavicle was anesthetized and a small incision was made with an #11 blade scalpel. A subcutaneous tunnel was formed to the vein dermatotomy site. The catheter was brought through the tunnel. The vein dermatotomy site was dilated to accommodate a peel-away sheath. The catheter was placed through the peel-away sheath and directed into the central venous structures. The tip of the catheter was placed at superior cavoatrial junction with fluoroscopy. Fluoroscopic images were obtained for documentation. Both lumens were found to aspirate and flush well. The  proper amount of heparin was flushed in both lumens. The vein dermatotomy site was closed using a single layer of absorbable suture and Dermabond. Gel-Foam was placed in subcutaneous tract. The catheter was secured to the skin using Prolene suture. IMPRESSION: Successful placement of a right jugular tunneled dialysis catheter using ultrasound and fluoroscopic guidance. Electronically Signed   By: Markus Daft M.D.   On: 11/02/2020 17:57  Scheduled Meds:  allopurinol  100 mg Oral Daily   vitamin C  1,000 mg Oral Daily   aspirin EC  81 mg Oral Daily   atorvastatin  80 mg Oral Daily   bictegravir-emtricitabine-tenofovir AF  1 tablet Oral Daily   Chlorhexidine Gluconate Cloth  6 each Topical Q0600   dapsone  100 mg Oral Daily   dextrose       dextrose  1 ampule Intravenous Once   escitalopram  20 mg Oral Daily   gabapentin  100 mg Oral TID   heparin injection (subcutaneous)  5,000 Units Subcutaneous Q8H   icosapent Ethyl  2 g Oral BID   insulin aspart  0-15 Units Subcutaneous TID WC   insulin aspart  0-5 Units Subcutaneous QHS   nutrition supplement (JUVEN)  1 packet Oral BID BM   oxybutynin  2.5 mg Oral BID   pantoprazole  40 mg Oral Daily   sodium bicarbonate  650 mg Oral BID   sodium zirconium cyclosilicate  10 g Oral BID   ticagrelor  90 mg Oral BID   zinc sulfate  220 mg Oral Daily   Continuous Infusions:   LOS: 17 days    Time spent: 25 mins.More than 50% of that time was spent in counseling and/or coordination of care.      Shelly Coss, MD [facility] P7/30/2022, 11:47 AM

## 2020-11-05 DIAGNOSIS — M86172 Other acute osteomyelitis, left ankle and foot: Secondary | ICD-10-CM | POA: Diagnosis not present

## 2020-11-05 LAB — CBC WITH DIFFERENTIAL/PLATELET
Abs Immature Granulocytes: 0.1 10*3/uL — ABNORMAL HIGH (ref 0.00–0.07)
Basophils Absolute: 0 10*3/uL (ref 0.0–0.1)
Basophils Relative: 0 %
Eosinophils Absolute: 0.3 10*3/uL (ref 0.0–0.5)
Eosinophils Relative: 4 %
HCT: 25 % — ABNORMAL LOW (ref 39.0–52.0)
Hemoglobin: 7.6 g/dL — ABNORMAL LOW (ref 13.0–17.0)
Immature Granulocytes: 1 %
Lymphocytes Relative: 21 %
Lymphs Abs: 1.8 10*3/uL (ref 0.7–4.0)
MCH: 25.9 pg — ABNORMAL LOW (ref 26.0–34.0)
MCHC: 30.4 g/dL (ref 30.0–36.0)
MCV: 85.3 fL (ref 80.0–100.0)
Monocytes Absolute: 0.7 10*3/uL (ref 0.1–1.0)
Monocytes Relative: 8 %
Neutro Abs: 5.5 10*3/uL (ref 1.7–7.7)
Neutrophils Relative %: 66 %
Platelets: 330 10*3/uL (ref 150–400)
RBC: 2.93 MIL/uL — ABNORMAL LOW (ref 4.22–5.81)
RDW: 14.1 % (ref 11.5–15.5)
WBC: 8.5 10*3/uL (ref 4.0–10.5)
nRBC: 0.2 % (ref 0.0–0.2)

## 2020-11-05 LAB — RENAL FUNCTION PANEL
Albumin: 2.5 g/dL — ABNORMAL LOW (ref 3.5–5.0)
Anion gap: 10 (ref 5–15)
BUN: 79 mg/dL — ABNORMAL HIGH (ref 6–20)
CO2: 22 mmol/L (ref 22–32)
Calcium: 9.2 mg/dL (ref 8.9–10.3)
Chloride: 101 mmol/L (ref 98–111)
Creatinine, Ser: 3.6 mg/dL — ABNORMAL HIGH (ref 0.61–1.24)
GFR, Estimated: 19 mL/min — ABNORMAL LOW (ref >60)
Glucose, Bld: 161 mg/dL — ABNORMAL HIGH (ref 70–99)
Phosphorus: 5.1 mg/dL — ABNORMAL HIGH (ref 2.5–4.6)
Potassium: 5.8 mmol/L — ABNORMAL HIGH (ref 3.5–5.1)
Sodium: 133 mmol/L — ABNORMAL LOW (ref 135–145)

## 2020-11-05 LAB — GLUCOSE, CAPILLARY
Glucose-Capillary: 182 mg/dL — ABNORMAL HIGH (ref 70–99)
Glucose-Capillary: 154 mg/dL — ABNORMAL HIGH (ref 70–99)
Glucose-Capillary: 152 mg/dL — ABNORMAL HIGH (ref 70–99)
Glucose-Capillary: 189 mg/dL — ABNORMAL HIGH (ref 70–99)

## 2020-11-05 MED ORDER — SODIUM CHLORIDE 0.9 % IV SOLN: INTRAVENOUS | Status: AC

## 2020-11-05 NOTE — Progress Notes (Signed)
Pt woundvac was removed per MD order. Staples on pt stump remain intact, small amount of serosanguinous drainage noted. Area around incision cleaned. Mepilex and stump shrinker applied. Pt tolerated procedure. Will continue to monitor.

## 2020-11-05 NOTE — Progress Notes (Signed)
PROGRESS NOTE    Keith [name]  [id number] DOB: 1965-12-30 DOA: 10/17/2020 [name], NP   Chief Complain:left heel ulcer  Brief Narrative:  Patient is a  55 year old male with past medical history of HIV, chronic kidney disease stage IIIb, type 2 diabetes mellitus, hypertension, hyperlipidemia, coronary artery disease, status post right below knee amputation who was sent to the emergency department by his orthopedic surgeon due to concern for left heel ulcer that has failed outpatient therapy.  MRI confirmed osteomyelitis.  He underwent left transtibial amputation by [name].  PT/OT recommended skilled nursing facility on discharge.  TOC consulted and following for disposition. Nephrology consulted  for worsening kidney function.  Underwent  temporary dialysis catheter placement for possible need of dialysis .  Nephrology closely following.  Kidney function has remained stable since last few days except for hyperkalemia.  We will plan to discharge him to skilled nursing facility after nephrology clearance.  Assessment & Plan:   Active Problems:   Elevated blood pressure reading with diagnosis of hypertension   Cellulitis   Ulcer of heel due to diabetes mellitus (HCC)   Renal insufficiency   Normochromic normocytic anemia   Cellulitis of left foot   Acute osteomyelitis of left calcaneus (HCC)   1.  AKI on Chronic kidney disease stage IIIb-IV/non-anion gap metabolic acidosis Baseline creatinine around 2.5.  Creatinine bumped up to the range of 4 and creeped up.    Also started on sodium bicarb tablets for non-anion gap metabolic acidosis.  .   Underwent temporary dialysis catheter placement,plan for  dialysis initiation but holding off for now.  There was also plan for kidney biopsy, will defer to nephrology.  ANA,antiDNA, GBM antibodies pending.Hepatitis panel negative.  C4 level normal, C3 mildly elevated Renal ultrasound negative for hydronephrosis, consistent with  chronic renal disease.  AKI thought to be secondary to ATN secondary to hypotension He remains  hyperkalemic today , will continue Lokelma. He does not have a nephrologist established yet as an outpatient  2. left foot ulcer Patient failed outpatient therapy with doxycycline Osteomyelitis was confirmed by MRI. Patient underwent left BKA October 27, 2020, had wound VAC,now d/ced Orthopedic were following, recommend outpatient follow-up.  3.  HIV stable Continue HAART regimen  4.  Diabetic nephropathy/diabetic neuropathy Continue home medications, on gabapentin  5.  Severely uncontrolled diabetes mellitus with hyperglycemia  last hemoglobin A1c was 14 Hospital course remarkable for low blood sugars.  Lantus on hold currently.  Currently only on sliding scale insulin. Likely noncompliant on insulin.  We recommend to continue previous insulin regimen on discharge  6.  Ambulatory dysfunction secondary to bilateral amputation PT /OT following.  Recommended skilled nursing facility on discharge.  TOC consulted  7.  Hypertension Antihypertensives D/Ced .BP soft but stable  8.  Normocytic anemia Hemoglobin currently stable in the range of 7-8.  Most likely associated  with chronic kidney disease.  9.  Morbid obesity BMI of 39         DVT prophylaxis:Heparin [place] Code Status: Full Family Communication: None at bedside Status is: Inpatient  Remains inpatient appropriate because:Unsafe d/c plan  Dispo:  Patient From:    Planned Disposition: [facility]  Medically stable for discharge:      Needs to get clearance from nephrology for discharge because of ongoing AKI, possible need for dialysis.  Consultants: Orthopedics  Procedures:Left BKA  Antimicrobials:  Anti-infectives (From admission, onward)    Start     Dose/Rate Route  Frequency Ordered Stop   11/02/20 1130  ceFAZolin (ANCEF) IVPB 2g/100 mL premix        2 g 200 mL/hr over 30 Minutes Intravenous To  Radiology 11/02/20 1042 11/02/20 1704   10/27/20 1245  ceFAZolin (ANCEF) IVPB 3g/100 mL premix        3 g 200 mL/hr over 30 Minutes Intravenous On call to O.R. 10/27/20 0755 10/27/20 1608   10/20/20 1415  linezolid (ZYVOX) tablet 600 mg  Status:  Discontinued        600 mg Oral Every 12 hours 10/20/20 1326 10/28/20 1115   10/19/20 1445  metroNIDAZOLE (FLAGYL) tablet 500 mg  Status:  Discontinued        500 mg Oral Every 12 hours 10/19/20 1347 10/28/20 1115   10/18/20 1600  ceFEPIme (MAXIPIME) 2 g in sodium chloride 0.9 % 100 mL IVPB  Status:  Discontinued        2 g 200 mL/hr over 30 Minutes Intravenous Every 12 hours 10/18/20 0414 10/28/20 1112   10/18/20 1000  dapsone tablet 100 mg        100 mg Oral Daily 10/18/20 0425     10/18/20 1000  bictegravir-emtricitabine-tenofovir AF (BIKTARVY) 50-200-25 MG per tablet 1 tablet        1 tablet Oral Daily 10/18/20 0610     10/18/20 0415  metroNIDAZOLE (FLAGYL) IVPB 500 mg  Status:  Discontinued        500 mg 100 mL/hr over 60 Minutes Intravenous Every 8 hours 10/18/20 0404 10/19/20 1347   10/18/20 0415  ceFEPIme (MAXIPIME) 2 g in sodium chloride 0.9 % 100 mL IVPB        2 g 200 mL/hr over 30 Minutes Intravenous  Once 10/18/20 0414 10/18/20 0500   10/18/20 0345  vancomycin (VANCOREADY) IVPB 2000 mg/400 mL        2,000 mg 200 mL/hr over 120 Minutes Intravenous  Once 10/18/20 0335 10/18/20 [id number]       Subjective:  Patient seen and examined at the bedside this morning.  Hemodynamically stable.  Denies any new complaints today.  Objective: Vitals:   11/04/20 1012 11/04/20 1802 11/04/20 2048 11/05/20 0446  BP: 114/74 133/80 102/64 (!) 101/52  Pulse: 65 77 67 67  Resp: '20 18 16 16  '$ Temp: 98 F (36.7 C) (!) 97.5 F (36.4 C) 98.6 F (37 C) 98.3 F (36.8 C)  TempSrc: Oral Axillary Oral Oral  SpO2: 97% 99% 100% 97%  Weight:      Height:        Intake/Output Summary (Last 24 hours) at 11/05/2020 0851 Last data filed at 11/05/2020  0519 Gross per 24 hour  Intake 480 ml  Output 1025 ml  Net -545 ml   Filed Weights   10/20/20 2107 10/22/20 0525 10/26/20 2221  Weight: 128 kg 124.8 kg 124.8 kg    Examination:  General exam: Overall comfortable, not in distress,obese HEENT: PERRL Respiratory system:  no wheezes or crackles  Cardiovascular system: S1 & S2 heard, RRR.  Tunneled dialysis catheter on the right chest Gastrointestinal system: Abdomen is nondistended, soft and nontender. Central nervous system: Alert and oriented Extremities: No edema, no clubbing ,no cyanosis, left BKA Skin: No rashes, no ulcers,no icterus    Data Reviewed: I have personally reviewed following labs and imaging studies  CBC: Recent Labs  Lab 10/30/20 0715 11/02/20 0345 11/03/20 0425 11/05/20 0206  WBC 11.9* 9.1 8.9 8.5  NEUTROABS  --   --  6.4 5.5  HGB 8.4* 7.5* 8.0* 7.6*  HCT 27.1* 24.0* 25.3* 25.0*  MCV 84.2 84.8 84.3 85.3  PLT 376 374 364 [id number]   Basic Metabolic Panel: Recent Labs  Lab 11/02/20 0345 11/03/20 0425 11/04/20 0124 11/04/20 1306 11/05/20 0206  NA 132* 131* 132* 130* 133*  K 5.2* 5.1 6.1* 5.7* 5.8*  CL 101 101 103 101 101  CO2 20* 20* 19* 19* 22  GLUCOSE 95 108* 108* 127* 161*  BUN 85* 84* 88* 82* 79*  CREATININE 4.91* 4.25* 4.17* 3.74* 3.60*  CALCIUM 9.2 9.0 9.1 9.3 9.2  PHOS 5.9* 5.4* 5.5*  --  5.1*   GFR: Estimated Creatinine Clearance: 30.7 mL/min (A) (by C-G formula based on SCr of 3.6 mg/dL (H)). Liver Function Tests: Recent Labs  Lab 11/02/20 0345 11/03/20 0425 11/04/20 0124 11/05/20 0206  ALBUMIN 2.3* 2.4* 2.5* 2.5*   No results for input(s): LIPASE, AMYLASE in the last 168 hours. No results for input(s): AMMONIA in the last 168 hours. Coagulation Profile: No results for input(s): INR, PROTIME in the last 168 hours. Cardiac Enzymes: No results for input(s): CKTOTAL, CKMB, CKMBINDEX, TROPONINI in the last 168 hours. BNP (last 3 results) No results for input(s): PROBNP in the last  8760 hours. HbA1C: No results for input(s): HGBA1C in the last 72 hours. CBG: Recent Labs  Lab 11/04/20 0749 11/04/20 1146 11/04/20 1605 11/04/20 2135 11/05/20 0655  GLUCAP 109* 151* 88 174* 182*   Lipid Profile: No results for input(s): CHOL, HDL, LDLCALC, TRIG, CHOLHDL, LDLDIRECT in the last 72 hours. Thyroid Function Tests: No results for input(s): TSH, T4TOTAL, FREET4, T3FREE, THYROIDAB in the last 72 hours. Anemia Panel: No results for input(s): VITAMINB12, FOLATE, FERRITIN, TIBC, IRON, RETICCTPCT in the last 72 hours. Sepsis Labs: No results for input(s): PROCALCITON, LATICACIDVEN in the last 168 hours.  Recent Results (from the past 240 hour(s))  Surgical pcr screen     Status: None   Collection Time: 10/27/20  9:52 AM   Specimen: Nasal Mucosa; Nasal Swab  Result Value Ref Range Status   MRSA, PCR NEGATIVE NEGATIVE Final   Staphylococcus aureus NEGATIVE NEGATIVE Final    Comment: (NOTE) The Xpert SA Assay (FDA approved for NASAL specimens in patients 56 years of age and older), is one component of a comprehensive surveillance program. It is not intended to diagnose infection nor to guide or monitor treatment. Performed at [facility], [place] [address] [place], [place] [address]          Radiology Studies: No results found.      Scheduled Meds:  allopurinol  100 mg Oral Daily   vitamin C  1,000 mg Oral Daily   aspirin EC  81 mg Oral Daily   atorvastatin  80 mg Oral Daily   bictegravir-emtricitabine-tenofovir AF  1 tablet Oral Daily   Chlorhexidine Gluconate Cloth  6 each Topical Q0600   dapsone  100 mg Oral Daily   dextrose  1 ampule Intravenous Once   escitalopram  20 mg Oral Daily   gabapentin  100 mg Oral TID   heparin injection (subcutaneous)  5,000 Units Subcutaneous Q8H   icosapent Ethyl  2 g Oral BID   insulin aspart  0-15 Units Subcutaneous TID WC   insulin aspart  0-5 Units Subcutaneous QHS   nutrition supplement (JUVEN)  1 packet  Oral BID BM   oxybutynin  2.5 mg Oral BID   pantoprazole  40 mg Oral Daily   sodium bicarbonate  650 mg Oral  BID   sodium zirconium cyclosilicate  10 g Oral BID   ticagrelor  90 mg Oral BID   zinc sulfate  220 mg Oral Daily   Continuous Infusions:  sodium chloride 100 mL/hr at 11/05/20 0825     LOS: 18 days    Time spent: 25 mins.More than 50% of that time was spent in counseling and/or coordination of care.      Shelly Coss, MD [facility] P7/31/2022, 8:51 AM

## 2020-11-05 NOTE — Plan of Care (Signed)
  Problem: Clinical Measurements: Goal: Ability to avoid or minimize complications of infection will improve Outcome: Progressing   Problem: Skin Integrity: Goal: Skin integrity will improve Outcome: Progressing   Problem: Clinical Measurements: Goal: Ability to maintain clinical measurements within normal limits will improve Outcome: Progressing   Problem: Nutrition: Goal: Adequate nutrition will be maintained Outcome: Progressing   Problem: Elimination: Goal: Will not experience complications related to bowel motility Outcome: Progressing   Problem: Pain Managment: Goal: General experience of comfort will improve Outcome: Progressing   Problem: Safety: Goal: Ability to remain free from injury will improve Outcome: Progressing   Problem: Skin Integrity: Goal: Risk for impaired skin integrity will decrease Outcome: Progressing

## 2020-11-05 NOTE — Progress Notes (Signed)
[facility] Progress Note   Assessment/Plan: Keith [name] is a 55 year old male with PMHx of HIV, CKDIIIb, hypertension, CAD s/p stent, type II diabetes mellitus s/p R BKA admitted for left diabetic heel ulcer concerning for osteomyelitis s/p L BKA on 7/22. Hospital course complicated by AKI on CKD.  AKI on CKDIIIb - baseline sCr ~2.5; noted to be worsening during hospital stay. sCr 4.9>4.25 this morning. 675cc urine output over past 24 hours; Renal ultrasound negative for hydronephrosis, consistent with chronic renal disease. C3/C4 complement wnl. Hepatitis panel negative. anti-DNA, GBM antibodies, ANCA all neg. Patient had tunneled HD cath placed yesterday.  -  Renal function slowly improving -will hold off on HD today.  - Will continue to trend renal function. - UOP decent (819m,1025 ml / 48hr) - Will leave RIJ TC in place for now in case he goes the wrong direction. Most likely  ATN 2/2 hypotension but fortunately renal function improving.  Hyperkalemia - likely 2/2 acute renal failure but would have expected a decrease. - Continue  Lokelma and will give NS x1 liter to increase distal delivery for Na/K exchange as well. - Renal low K diet; he's been eating bananas and having OJ as well. Counseled pt and will ensure diet is changed.  Non-anion gap acidosis - secondary to worsening renal function as above, continue sodium bicarb.  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  Hypertension: has continued to have soft BP's. Normocytic anemia - continue to monitor   Subjective:   HD 17 Overnight, no acute events reported.  Keith [name] evaluated at bedside this morning. He is resting comfortably in bed; no acute concerns at this time. Discussed that his renal function only slightly improved c/w yest but K still  up.   He denies obstructive symptoms/ f/c/n/v/ dyspnea.  Objective Vitals:   11/04/20 1012 11/04/20 1802 11/04/20 2048 11/05/20 0446  BP: 114/74 133/80 102/64 (!) 101/52  Pulse: 65 77 67 67  Resp: '20 18 16 16  '$ Temp: 98 F (36.7 C) (!) 97.5 F (36.4 C) 98.6 F (37 C) 98.3 F (36.8 C)  TempSrc: Oral Axillary Oral Oral  SpO2: 97% 99% 100% 97%  Weight:      Height:       Physical Exam General: middle aged male, resting comfortably in bed, no acute distress Heart: RRR, S1 and S2 present  Lungs: CTAB on RA;  Abdomen: soft, nondistended, nontender, +BS Extremities: bilateral BKA, wound vac on LLE  Dialysis Access: tunneled HD cath R IJ   Additional Objective Labs: Basic Metabolic Panel: Recent Labs  Lab 11/03/20 0425 11/04/20 0124 11/04/20 1306 11/05/20 0206  NA 131* 132* 130* 133*  K 5.1 6.1* 5.7* 5.8*  CL 101 103 101 101  CO2 20* 19* 19* 22  GLUCOSE 108* 108* 127* 161*  BUN 84* 88* 82* 79*  CREATININE 4.25* 4.17* 3.74* 3.60*  CALCIUM 9.0 9.1 9.3 9.2  PHOS 5.4* 5.5*  --  5.1*   Liver Function Tests: Recent Labs  Lab 11/03/20 0425 11/04/20 0124 11/05/20 0206  ALBUMIN 2.4* 2.5* 2.5*   No results for input(s): LIPASE, AMYLASE in the last 168 hours. CBC: Recent Labs  Lab 10/30/20 0715 11/02/20 0345 11/03/20 0425 11/05/20 0206  WBC 11.9* 9.1 8.9 8.5  NEUTROABS  --   --  6.4 5.5  HGB  8.4* 7.5* 8.0* 7.6*  HCT 27.1* 24.0* 25.3* 25.0*  MCV 84.2 84.8 84.3 85.3  PLT 376 374 364 330   Blood Culture    Component Value Date/Time   SDES BLOOD RIGHT WRIST 10/18/2020 0439   SPECREQUEST  10/18/2020 0439    BOTTLES DRAWN AEROBIC AND ANAEROBIC Blood Culture results may not be optimal due to an inadequate volume of blood received in culture bottles   CULT  10/18/2020 0439    NO GROWTH 5 DAYS Performed at [facility] [address], [place], [place] [address]    REPTSTATUS 10/23/2020 FINAL 10/18/2020 0439    Cardiac Enzymes: No results for input(s):  CKTOTAL, CKMB, CKMBINDEX, TROPONINI in the last 168 hours. CBG: Recent Labs  Lab 11/04/20 0749 11/04/20 1146 11/04/20 1605 11/04/20 2135 11/05/20 0655  GLUCAP 109* 151* 88 174* 182*   Iron Studies: No results for input(s): IRON, TIBC, TRANSFERRIN, FERRITIN in the last 72 hours.  Studies/Results: No results found. Medications:   allopurinol  100 mg Oral Daily   vitamin C  1,000 mg Oral Daily   aspirin EC  81 mg Oral Daily   atorvastatin  80 mg Oral Daily   bictegravir-emtricitabine-tenofovir AF  1 tablet Oral Daily   Chlorhexidine Gluconate Cloth  6 each Topical Q0600   dapsone  100 mg Oral Daily   dextrose  1 ampule Intravenous Once   escitalopram  20 mg Oral Daily   gabapentin  100 mg Oral TID   heparin injection (subcutaneous)  5,000 Units Subcutaneous Q8H   icosapent Ethyl  2 g Oral BID   insulin aspart  0-15 Units Subcutaneous TID WC   insulin aspart  0-5 Units Subcutaneous QHS   nutrition supplement (JUVEN)  1 packet Oral BID BM   oxybutynin  2.5 mg Oral BID   pantoprazole  40 mg Oral Daily   sodium bicarbonate  650 mg Oral BID   sodium zirconium cyclosilicate  10 g Oral BID   ticagrelor  90 mg Oral BID   zinc sulfate  220 mg Oral Daily

## 2020-11-06 DIAGNOSIS — M86172 Other acute osteomyelitis, left ankle and foot: Secondary | ICD-10-CM | POA: Diagnosis not present

## 2020-11-06 LAB — RENAL FUNCTION PANEL
Albumin: 2.6 g/dL — ABNORMAL LOW (ref 3.5–5.0)
Anion gap: 10 (ref 5–15)
BUN: 74 mg/dL — ABNORMAL HIGH (ref 6–20)
CO2: 20 mmol/L — ABNORMAL LOW (ref 22–32)
Calcium: 9.2 mg/dL (ref 8.9–10.3)
Chloride: 105 mmol/L (ref 98–111)
Creatinine, Ser: 3.39 mg/dL — ABNORMAL HIGH (ref 0.61–1.24)
GFR, Estimated: 21 mL/min — ABNORMAL LOW (ref >60)
Glucose, Bld: 159 mg/dL — ABNORMAL HIGH (ref 70–99)
Phosphorus: 4.8 mg/dL — ABNORMAL HIGH (ref 2.5–4.6)
Potassium: 6.1 mmol/L — ABNORMAL HIGH (ref 3.5–5.1)
Sodium: 135 mmol/L (ref 135–145)

## 2020-11-06 LAB — BASIC METABOLIC PANEL
Anion gap: 9 (ref 5–15)
BUN: 69 mg/dL — ABNORMAL HIGH (ref 6–20)
CO2: 22 mmol/L (ref 22–32)
Calcium: 9.5 mg/dL (ref 8.9–10.3)
Chloride: 105 mmol/L (ref 98–111)
Creatinine, Ser: 3.4 mg/dL — ABNORMAL HIGH (ref 0.61–1.24)
GFR, Estimated: 20 mL/min — ABNORMAL LOW (ref >60)
Glucose, Bld: 178 mg/dL — ABNORMAL HIGH (ref 70–99)
Potassium: 5.7 mmol/L — ABNORMAL HIGH (ref 3.5–5.1)
Sodium: 136 mmol/L (ref 135–145)

## 2020-11-06 LAB — GLUCOSE, CAPILLARY
Glucose-Capillary: 147 mg/dL — ABNORMAL HIGH (ref 70–99)
Glucose-Capillary: 160 mg/dL — ABNORMAL HIGH (ref 70–99)
Glucose-Capillary: 144 mg/dL — ABNORMAL HIGH (ref 70–99)
Glucose-Capillary: 148 mg/dL — ABNORMAL HIGH (ref 70–99)

## 2020-11-06 MED ORDER — CALCIUM GLUCONATE-NACL 1-0.675 GM/50ML-% IV SOLN
1.0000 g | Freq: Once | INTRAVENOUS | Status: AC
  Administered 2020-11-06: 1000 mg via INTRAVENOUS
  Filled 2020-11-06: qty 50

## 2020-11-06 MED ORDER — DEXTROSE 50 % IV SOLN
1.0000 | Freq: Once | INTRAVENOUS | Status: AC
  Administered 2020-11-06: 50 mL via INTRAVENOUS
  Filled 2020-11-06: qty 50

## 2020-11-06 MED ORDER — INSULIN ASPART 100 UNIT/ML IJ SOLN
5.0000 [IU] | Freq: Once | INTRAMUSCULAR | Status: AC
  Administered 2020-11-06: 5 [IU] via INTRAVENOUS

## 2020-11-06 MED ORDER — SODIUM BICARBONATE 650 MG PO TABS
1300.0000 mg | ORAL_TABLET | Freq: Two times a day (BID) | ORAL | Status: DC
  Administered 2020-11-06 – 2020-11-08 (×6): 1300 mg via ORAL
  Filled 2020-11-06 (×6): qty 2

## 2020-11-06 NOTE — Plan of Care (Signed)
  Problem: Skin Integrity: Goal: Skin integrity will improve Outcome: Progressing   Problem: Health Behavior/Discharge Planning: Goal: Ability to manage health-related needs will improve Outcome: Progressing   Problem: Clinical Measurements: Goal: Will remain free from infection Outcome: Progressing   Problem: Nutrition: Goal: Adequate nutrition will be maintained Outcome: Progressing

## 2020-11-06 NOTE — Progress Notes (Signed)
PROGRESS NOTE    Keith [name]  [id number] DOB: 09-28-1965 DOA: 10/17/2020 [name], NP   Chief Complain:left heel ulcer  Brief Narrative:  Patient is a  55 year old male with past medical history of HIV, chronic kidney disease stage IIIb, type 2 diabetes mellitus, hypertension, hyperlipidemia, coronary artery disease, status post right below knee amputation who was sent to the emergency department by his orthopedic surgeon due to concern for left heel ulcer that has failed outpatient therapy.  MRI confirmed osteomyelitis.  He underwent left transtibial amputation by [name].  PT/OT recommended skilled nursing facility on discharge.  TOC consulted and following for disposition. Nephrology consulted  for worsening kidney function.  Underwent  temporary dialysis catheter placement for possible need of dialysis .  Nephrology closely following.  Kidney function has remained stable since last few days except for hyperkalemia.  We will plan to discharge him to skilled nursing facility after nephrology clearance.  Assessment & Plan:   Active Problems:   Elevated blood pressure reading with diagnosis of hypertension   Cellulitis   Ulcer of heel due to diabetes mellitus (HCC)   Renal insufficiency   Normochromic normocytic anemia   Cellulitis of left foot   Acute osteomyelitis of left calcaneus (HCC)   1.  AKI on Chronic kidney disease stage IIIb-IV/non-anion gap metabolic acidosis/Hyperkalemia Baseline creatinine around 2.5.  Creatinine bumped up to the range of 4 and creeped up.    Also started on sodium bicarb tablets for non-anion gap metabolic acidosis.  .   Underwent temporary dialysis catheter placement,plan for  dialysis initiation but holding off for now.  There was also plan for kidney biopsy, will defer to nephrology.  ANA,antiDNA, GBM antibodies pending.Hepatitis panel negative.  C4 level normal, C3 mildly elevated Renal ultrasound negative for hydronephrosis,  consistent with chronic renal disease.  AKI thought to be secondary to ATN secondary to hypotension He does not have a nephrologist established yet as an outpatient Potassium level in the range of 6 today.  Will order a dose of IV calcium gluconate, IV insulin 5 units, D50.  Continue Lokelma.  Will check BMP later today.  2. left foot ulcer Patient failed outpatient therapy with doxycycline Osteomyelitis was confirmed by MRI. Patient underwent left BKA October 27, 2020, had wound VAC,now d/ced Orthopedic were following, recommend outpatient follow-up.  3.  HIV stable Continue HAART regimen  4.  Diabetic nephropathy/diabetic neuropathy Continue home medications, on gabapentin  5.  Severely uncontrolled diabetes mellitus with hyperglycemia  last hemoglobin A1c was 14 Hospital course remarkable for low blood sugars.  Lantus on hold currently.  Currently only on sliding scale insulin. Likely noncompliant on insulin.  We recommend to continue previous insulin regimen on discharge  6.  Ambulatory dysfunction secondary to bilateral amputation PT /OT following.  Recommended skilled nursing facility on discharge.  TOC consulted  7.  Hypertension Antihypertensives D/Ced .BP soft but stable  8.  Normocytic anemia Hemoglobin currently stable in the range of 7-8.  Most likely associated  with chronic kidney disease.  9.  Morbid obesity BMI of 39         DVT prophylaxis:Heparin Carter Code Status: Full Family Communication: None at bedside Status is: Inpatient  Remains inpatient appropriate because:Unsafe d/c plan  Dispo:  Patient From:    Planned Disposition: [facility]  Medically stable for discharge:      Needs to get clearance from nephrology for discharge because of ongoing AKI, hyperkalemia.  Consultants: Orthopedics  Procedures:Left BKA  Antimicrobials:  Anti-infectives (From admission, onward)    Start     Dose/Rate Route Frequency Ordered Stop   11/02/20  1130  ceFAZolin (ANCEF) IVPB 2g/100 mL premix        2 g 200 mL/hr over 30 Minutes Intravenous To Radiology 11/02/20 1042 11/02/20 1704   10/27/20 1245  ceFAZolin (ANCEF) IVPB 3g/100 mL premix        3 g 200 mL/hr over 30 Minutes Intravenous On call to O.R. 10/27/20 0755 10/27/20 1608   10/20/20 1415  linezolid (ZYVOX) tablet 600 mg  Status:  Discontinued        600 mg Oral Every 12 hours 10/20/20 1326 10/28/20 1115   10/19/20 1445  metroNIDAZOLE (FLAGYL) tablet 500 mg  Status:  Discontinued        500 mg Oral Every 12 hours 10/19/20 1347 10/28/20 1115   10/18/20 1600  ceFEPIme (MAXIPIME) 2 g in sodium chloride 0.9 % 100 mL IVPB  Status:  Discontinued        2 g 200 mL/hr over 30 Minutes Intravenous Every 12 hours 10/18/20 0414 10/28/20 1112   10/18/20 1000  dapsone tablet 100 mg        100 mg Oral Daily 10/18/20 0425     10/18/20 1000  bictegravir-emtricitabine-tenofovir AF (BIKTARVY) 50-200-25 MG per tablet 1 tablet        1 tablet Oral Daily 10/18/20 0610     10/18/20 0415  metroNIDAZOLE (FLAGYL) IVPB 500 mg  Status:  Discontinued        500 mg 100 mL/hr over 60 Minutes Intravenous Every 8 hours 10/18/20 0404 10/19/20 1347   10/18/20 0415  ceFEPIme (MAXIPIME) 2 g in sodium chloride 0.9 % 100 mL IVPB        2 g 200 mL/hr over 30 Minutes Intravenous  Once 10/18/20 0414 10/18/20 0500   10/18/20 0345  vancomycin (VANCOREADY) IVPB 2000 mg/400 mL        2,000 mg 200 mL/hr over 120 Minutes Intravenous  Once 10/18/20 0335 10/18/20 [id number]       Subjective:  Patient seen and examined at the bedside this morning.  Hemodynamically stable.  Denies any complaints today.  Objective: Vitals:   11/05/20 1015 11/05/20 1702 11/05/20 2115 11/06/20 0554  BP: 115/71 116/72 102/71 122/75  Pulse: 70 69 71 74  Resp: '16 18 20 17  '$ Temp: 98 F (36.7 C) 97.8 F (36.6 C) 98 F (36.7 C) 98.1 F (36.7 C)  TempSrc:   Oral Oral  SpO2: 99% 97% 96% 94%  Weight:      Height:        Intake/Output  Summary (Last 24 hours) at 11/06/2020 0841 Last data filed at 11/06/2020 0800 Gross per 24 hour  Intake --  Output 302 ml  Net -302 ml   Filed Weights   10/20/20 2107 10/22/20 0525 10/26/20 2221  Weight: 128 kg 124.8 kg 124.8 kg    Examination:  General exam: Overall comfortable, not in distress,obese HEENT: PERRL Respiratory system:  no wheezes or crackles  Cardiovascular system: S1 & S2 heard, RRR.  Gastrointestinal system: Abdomen is nondistended, soft and nontender. Central nervous system: Alert and oriented Extremities: No edema, no clubbing ,no cyanosis.left BKA Skin: No rashes, no ulcers,no icterus    Data Reviewed: I have personally reviewed following labs and imaging studies  CBC: Recent Labs  Lab 11/02/20 0345 11/03/20 0425 11/05/20 0206  WBC 9.1 8.9 8.5  NEUTROABS  --  6.4 5.5  HGB 7.5* 8.0* 7.6*  HCT 24.0* 25.3* 25.0*  MCV 84.8 84.3 85.3  PLT 374 364 [id number]   Basic Metabolic Panel: Recent Labs  Lab 11/02/20 0345 11/03/20 0425 11/04/20 0124 11/04/20 1306 11/05/20 0206 11/06/20 0154  NA 132* 131* 132* 130* 133* 135  K 5.2* 5.1 6.1* 5.7* 5.8* 6.1*  CL 101 101 103 101 101 105  CO2 20* 20* 19* 19* 22 20*  GLUCOSE 95 108* 108* 127* 161* 159*  BUN 85* 84* 88* 82* 79* 74*  CREATININE 4.91* 4.25* 4.17* 3.74* 3.60* 3.39*  CALCIUM 9.2 9.0 9.1 9.3 9.2 9.2  PHOS 5.9* 5.4* 5.5*  --  5.1* 4.8*   GFR: Estimated Creatinine Clearance: 32.6 mL/min (A) (by C-G formula based on SCr of 3.39 mg/dL (H)). Liver Function Tests: Recent Labs  Lab 11/02/20 0345 11/03/20 0425 11/04/20 0124 11/05/20 0206 11/06/20 0154  ALBUMIN 2.3* 2.4* 2.5* 2.5* 2.6*   No results for input(s): LIPASE, AMYLASE in the last 168 hours. No results for input(s): AMMONIA in the last 168 hours. Coagulation Profile: No results for input(s): INR, PROTIME in the last 168 hours. Cardiac Enzymes: No results for input(s): CKTOTAL, CKMB, CKMBINDEX, TROPONINI in the last 168 hours. BNP (last 3  results) No results for input(s): PROBNP in the last 8760 hours. HbA1C: No results for input(s): HGBA1C in the last 72 hours. CBG: Recent Labs  Lab 11/05/20 0655 11/05/20 1129 11/05/20 1702 11/05/20 2130 11/06/20 0629  GLUCAP 182* 154* 152* 189* 147*   Lipid Profile: No results for input(s): CHOL, HDL, LDLCALC, TRIG, CHOLHDL, LDLDIRECT in the last 72 hours. Thyroid Function Tests: No results for input(s): TSH, T4TOTAL, FREET4, T3FREE, THYROIDAB in the last 72 hours. Anemia Panel: No results for input(s): VITAMINB12, FOLATE, FERRITIN, TIBC, IRON, RETICCTPCT in the last 72 hours. Sepsis Labs: No results for input(s): PROCALCITON, LATICACIDVEN in the last 168 hours.  Recent Results (from the past 240 hour(s))  Surgical pcr screen     Status: None   Collection Time: 10/27/20  9:52 AM   Specimen: Nasal Mucosa; Nasal Swab  Result Value Ref Range Status   MRSA, PCR NEGATIVE NEGATIVE Final   Staphylococcus aureus NEGATIVE NEGATIVE Final    Comment: (NOTE) The Xpert SA Assay (FDA approved for NASAL specimens in patients 30 years of age and older), is one component of a comprehensive surveillance program. It is not intended to diagnose infection nor to guide or monitor treatment. Performed at [facility], [place] [address], [place], [place] [address]          Radiology Studies: No results found.      Scheduled Meds:  allopurinol  100 mg Oral Daily   vitamin C  1,000 mg Oral Daily   aspirin EC  81 mg Oral Daily   atorvastatin  80 mg Oral Daily   bictegravir-emtricitabine-tenofovir AF  1 tablet Oral Daily   Chlorhexidine Gluconate Cloth  6 each Topical Q0600   dapsone  100 mg Oral Daily   dextrose  1 ampule Intravenous Once   dextrose  1 ampule Intravenous Once   escitalopram  20 mg Oral Daily   gabapentin  100 mg Oral TID   heparin injection (subcutaneous)  5,000 Units Subcutaneous Q8H   icosapent Ethyl  2 g Oral BID   insulin aspart  0-15 Units Subcutaneous  TID WC   insulin aspart  0-5 Units Subcutaneous QHS   insulin aspart  5 Units Intravenous Once   nutrition supplement (JUVEN)  1 packet  Oral BID BM   oxybutynin  2.5 mg Oral BID   pantoprazole  40 mg Oral Daily   sodium bicarbonate  1,300 mg Oral BID   sodium zirconium cyclosilicate  10 g Oral BID   ticagrelor  90 mg Oral BID   zinc sulfate  220 mg Oral Daily   Continuous Infusions:  calcium gluconate       LOS: 19 days    Time spent: 25 mins.More than 50% of that time was spent in counseling and/or coordination of care.      Shelly Coss, MD [facility] P8/04/2020, 8:41 AM

## 2020-11-06 NOTE — Progress Notes (Signed)
[facility] Progress Note   Assessment/Plan: [name] with PMHx of HIV, CKDIIIb, hypertension, CAD s/p stent, type II diabetes mellitus s/p R BKA admitted for left diabetic heel ulcer concerning for osteomyelitis s/p L BKA on 7/22. Hospital course complicated by AKI on CKD.  AKI on CKDIIIb - baseline sCr ~2.5; creatinine peaked at 4.9, renal ultrasound negative for hydronephrosis, C3/C4 complement wnl. Hepatitis panel negative. anti-DNA, GBM antibodies, ANCA all neg. there is concern with progression to ESRD but kidney function has been improving with fair urine output - Will leave RIJ TC in place for now in case he goes the wrong direction. Most likely  ATN 2/2 hypotension but fortunately renal function improving slowly Hyperkalemia -likely multifactorial - Continue  Lokelma for now - Renal low K diet; he's been eating bananas and having OJ as well. Counseled pt and will ensure diet is changed.  Non-anion gap acidosis -likely secondary to CKD with AKI.  Increase sodium bicarb today. 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  Hypertension: Holding some BP meds Normocytic anemia - continue to monitor   Subjective:   Patient states he feels well today without any specific complaints  Objective Vitals:   11/05/20 1702 11/05/20 2115 11/06/20 0554 11/06/20 1003  BP: 116/72 102/71 122/75 107/81  Pulse: 69 71 74 68  Resp: '18 20 17 18  '$ Temp: 97.8 F (36.6 C) 98 F (36.7 C) 98.1 F (36.7 C) 98.1 F (36.7 C)  TempSrc:  Oral Oral Oral  SpO2: 97% 96% 94% 97%  Weight:      Height:       Physical Exam General: resting comfortably in bed, no acute distress Heart: Normal rate, no audible murmur Lungs: CTAB on RA;  Abdomen: soft, nondistended, nontender, +BS Extremities: bilateral BKA,  wound vac on LLE  Dialysis Access: tunneled HD cath R IJ   Additional Objective Labs: Basic Metabolic Panel: Recent Labs  Lab 11/04/20 0124 11/04/20 1306 11/05/20 0206 11/06/20 0154  NA 132* 130* 133* 135  K 6.1* 5.7* 5.8* 6.1*  CL 103 101 101 105  CO2 19* 19* 22 20*  GLUCOSE 108* 127* 161* 159*  BUN 88* 82* 79* 74*  CREATININE 4.17* 3.74* 3.60* 3.39*  CALCIUM 9.1 9.3 9.2 9.2  PHOS 5.5*  --  5.1* 4.8*   Liver Function Tests: Recent Labs  Lab 11/04/20 0124 11/05/20 0206 11/06/20 0154  ALBUMIN 2.5* 2.5* 2.6*   No results for input(s): LIPASE, AMYLASE in the last 168 hours. CBC: Recent Labs  Lab 11/02/20 0345 11/03/20 0425 11/05/20 0206  WBC 9.1 8.9 8.5  NEUTROABS  --  6.4 5.5  HGB 7.5* 8.0* 7.6*  HCT 24.0* 25.3* 25.0*  MCV 84.8 84.3 85.3  PLT 374 364 330   Blood Culture    Component Value Date/Time   SDES BLOOD RIGHT WRIST 10/18/2020 0439   SPECREQUEST  10/18/2020 0439    BOTTLES DRAWN AEROBIC AND ANAEROBIC Blood Culture results may not be optimal due to an inadequate volume of blood received in culture bottles   CULT  10/18/2020 0439    NO GROWTH 5 DAYS Performed at [facility] [address] [place], [place] [address]    REPTSTATUS 10/23/2020 FINAL 10/18/2020 0439    Cardiac Enzymes: No results for input(s): CKTOTAL, CKMB, CKMBINDEX, TROPONINI in  the last 168 hours. CBG: Recent Labs  Lab 11/05/20 1129 11/05/20 1702 11/05/20 2130 11/06/20 0629 11/06/20 1200  GLUCAP 154* 152* 189* 147* 160*   Iron Studies: No results for input(s): IRON, TIBC, TRANSFERRIN, FERRITIN in the last 72 hours.  Studies/Results: No results found. Medications:   allopurinol  100 mg Oral Daily   vitamin C  1,000 mg Oral Daily   aspirin EC  81 mg Oral Daily   atorvastatin  80 mg Oral Daily   bictegravir-emtricitabine-tenofovir AF  1 tablet Oral Daily   Chlorhexidine Gluconate Cloth  6 each Topical Q0600   dapsone  100 mg Oral Daily   dextrose  1 ampule  Intravenous Once   escitalopram  20 mg Oral Daily   gabapentin  100 mg Oral TID   heparin injection (subcutaneous)  5,000 Units Subcutaneous Q8H   icosapent Ethyl  2 g Oral BID   insulin aspart  0-15 Units Subcutaneous TID WC   insulin aspart  0-5 Units Subcutaneous QHS   nutrition supplement (JUVEN)  1 packet Oral BID BM   oxybutynin  2.5 mg Oral BID   pantoprazole  40 mg Oral Daily   sodium bicarbonate  1,300 mg Oral BID   sodium zirconium cyclosilicate  10 g Oral BID   ticagrelor  90 mg Oral BID   zinc sulfate  220 mg Oral Daily

## 2020-11-07 DIAGNOSIS — B20 Human immunodeficiency virus [HIV] disease: Secondary | ICD-10-CM | POA: Diagnosis not present

## 2020-11-07 DIAGNOSIS — E875 Hyperkalemia: Secondary | ICD-10-CM

## 2020-11-07 DIAGNOSIS — N289 Disorder of kidney and ureter, unspecified: Secondary | ICD-10-CM | POA: Diagnosis not present

## 2020-11-07 DIAGNOSIS — M86172 Other acute osteomyelitis, left ankle and foot: Secondary | ICD-10-CM | POA: Diagnosis not present

## 2020-11-07 LAB — RENAL FUNCTION PANEL
Albumin: 2.8 g/dL — ABNORMAL LOW (ref 3.5–5.0)
Albumin: 2.9 g/dL — ABNORMAL LOW (ref 3.5–5.0)
Anion gap: 9 (ref 5–15)
Anion gap: 9 (ref 5–15)
BUN: 78 mg/dL — ABNORMAL HIGH (ref 6–20)
BUN: 79 mg/dL — ABNORMAL HIGH (ref 6–20)
CO2: 20 mmol/L — ABNORMAL LOW (ref 22–32)
CO2: 19 mmol/L — ABNORMAL LOW (ref 22–32)
Calcium: 9.4 mg/dL (ref 8.9–10.3)
Calcium: 9.7 mg/dL (ref 8.9–10.3)
Chloride: 105 mmol/L (ref 98–111)
Chloride: 106 mmol/L (ref 98–111)
Creatinine, Ser: 3.8 mg/dL — ABNORMAL HIGH (ref 0.61–1.24)
Creatinine, Ser: 4.13 mg/dL — ABNORMAL HIGH (ref 0.61–1.24)
GFR, Estimated: 18 mL/min — ABNORMAL LOW (ref >60)
GFR, Estimated: 16 mL/min — ABNORMAL LOW (ref >60)
Glucose, Bld: 192 mg/dL — ABNORMAL HIGH (ref 70–99)
Glucose, Bld: 185 mg/dL — ABNORMAL HIGH (ref 70–99)
Phosphorus: 4.7 mg/dL — ABNORMAL HIGH (ref 2.5–4.6)
Phosphorus: 4.8 mg/dL — ABNORMAL HIGH (ref 2.5–4.6)
Potassium: 7.1 mmol/L (ref 3.5–5.1)
Potassium: 6.1 mmol/L — ABNORMAL HIGH (ref 3.5–5.1)
Sodium: 134 mmol/L — ABNORMAL LOW (ref 135–145)
Sodium: 134 mmol/L — ABNORMAL LOW (ref 135–145)

## 2020-11-07 LAB — GLUCOSE, CAPILLARY
Glucose-Capillary: 171 mg/dL — ABNORMAL HIGH (ref 70–99)
Glucose-Capillary: 158 mg/dL — ABNORMAL HIGH (ref 70–99)
Glucose-Capillary: 171 mg/dL — ABNORMAL HIGH (ref 70–99)
Glucose-Capillary: 178 mg/dL — ABNORMAL HIGH (ref 70–99)

## 2020-11-07 MED ORDER — INSULIN ASPART 100 UNIT/ML IJ SOLN
10.0000 [IU] | Freq: Once | INTRAMUSCULAR | Status: AC
  Administered 2020-11-07: 10 [IU] via INTRAVENOUS

## 2020-11-07 MED ORDER — DEXTROSE 50 % IV SOLN
25.0000 mL | Freq: Once | INTRAVENOUS | Status: AC
  Administered 2020-11-07: 25 mL via INTRAVENOUS
  Filled 2020-11-07: qty 50

## 2020-11-07 MED ORDER — LAMIVUDINE 150 MG PO TABS
300.0000 mg | ORAL_TABLET | Freq: Every day | ORAL | Status: DC
  Administered 2020-11-08 – 2020-12-27 (×50): 300 mg via ORAL
  Filled 2020-11-07 (×50): qty 2

## 2020-11-07 MED ORDER — CHLORTHALIDONE 25 MG PO TABS
12.5000 mg | ORAL_TABLET | Freq: Every day | ORAL | Status: DC
  Administered 2020-11-07 – 2020-11-08 (×2): 12.5 mg via ORAL
  Filled 2020-11-07 (×3): qty 0.5

## 2020-11-07 MED ORDER — CALCIUM GLUCONATE-NACL 1-0.675 GM/50ML-% IV SOLN
1.0000 g | Freq: Once | INTRAVENOUS | Status: AC
  Administered 2020-11-07: 1000 mg via INTRAVENOUS
  Filled 2020-11-07: qty 50

## 2020-11-07 MED ORDER — DOLUTEGRAVIR SODIUM 50 MG PO TABS
50.0000 mg | ORAL_TABLET | Freq: Every day | ORAL | Status: DC
  Administered 2020-11-08 – 2020-12-26 (×49): 50 mg via ORAL
  Filled 2020-11-07 (×50): qty 1

## 2020-11-07 MED ORDER — SODIUM BICARBONATE 8.4 % IV SOLN
INTRAVENOUS | Status: AC
  Filled 2020-11-07 (×2): qty 1000

## 2020-11-07 MED ORDER — DORAVIRINE 100 MG PO TABS
100.0000 mg | ORAL_TABLET | Freq: Every day | ORAL | Status: DC
  Administered 2020-11-08 – 2020-12-27 (×50): 100 mg via ORAL
  Filled 2020-11-07 (×53): qty 1

## 2020-11-07 MED ORDER — SODIUM ZIRCONIUM CYCLOSILICATE 10 G PO PACK
20.0000 g | PACK | Freq: Two times a day (BID) | ORAL | Status: DC
  Administered 2020-11-07 (×2): 20 g via ORAL
  Filled 2020-11-07 (×2): qty 2

## 2020-11-07 NOTE — Consult Note (Addendum)
[facility]        Reason for Consult: hyperkalemia   Referring Physician: Tawanna Solo  Active Problems:   Elevated blood pressure reading with diagnosis of hypertension   Cellulitis   Ulcer of heel due to diabetes mellitus (HCC)   Renal insufficiency   Normochromic normocytic anemia   Cellulitis of left foot   Acute osteomyelitis of left calcaneus (HCC)    HPI: Keith [name] is a 55 y.o. male with hx of CAD recent STEMI, HIV disease with low level viremia, CKD 5, and right bka, and left foot abscess/osteomyelitis s/p TM amputation on 7/20. Still having ongoing hyperkalemia while on biktarvy. Patient is afebrile. Still having oozing from his right surgical site of right BKA. Primary team asking to change ART given persistent hyperkalemia while on biktarvy.  Historic genotype = [id number].   05/09/20 genotype= Integrase inhibitor pan-sensitive RT GENE MUTATIONS: [id number]  PR GENE MUTATIONS: L10V,D60E,L63P,V77I  ___________________________________________________________  Past Medical History:  Diagnosis Date   Abscess of right foot    abscess/ulcer of R transtibial amputation requiring IV abx   AIDS ([place])    Anemia    CAD (coronary artery disease)    a. MI with stenting of OM1 in 11/2018 with residual disease. b. acute STEMI 03/2020 s/p DES to OM1   Chronic anemia    Chronic knee pain    right   Chronic pain    CKD (chronic kidney disease), stage IV (HCC)    CVA (cerebral vascular accident) ([place]) 04/2020   Diabetes type 2, uncontrolled ([place])    HgA1c 17.6 (04/27/2010)   Diabetic foot ulcer ([place]) 01/2017   right foot   Dilatation of aorta (HCC)    Erectile dysfunction    Genital warts    HIV (human immunodeficiency virus infection) ([place]) 2009   CD4 count 100, VL 13800 (05/01/2010)   Hyperlipidemia    Hypertension    Noncompliance with medication regimen    Osteomyelitis ([place])    h/o hand   Osteomyelitis of metatarsal ([place]) 04/28/2017     Allergies:  Allergies  Allergen Reactions   Elemental Sulfur Itching    Patient stated he's allergic to "sulfur" AND "sulfa"   Sulfa Antibiotics Itching    MEDICATIONS:  allopurinol  100 mg Oral Daily   vitamin C  1,000 mg Oral Daily   aspirin EC  81 mg Oral Daily   atorvastatin  80 mg Oral Daily   Chlorhexidine Gluconate Cloth  6 each Topical Q0600   chlorthalidone  12.5 mg Oral Daily   dapsone  100 mg Oral Daily   [START ON 11/08/2020] dolutegravir  50 mg Oral Daily   [START ON 11/08/2020] doravirine  100 mg Oral Daily   escitalopram  20 mg Oral Daily   gabapentin  100 mg Oral TID   heparin injection (subcutaneous)  5,000 Units Subcutaneous Q8H   icosapent Ethyl  2 g Oral BID   insulin aspart  0-15 Units Subcutaneous TID WC   insulin aspart  0-5 Units Subcutaneous QHS   [START ON 11/08/2020] lamiVUDine  300 mg Oral Daily   nutrition supplement (JUVEN)  1 packet Oral BID BM   oxybutynin  2.5 mg Oral BID   pantoprazole  40 mg Oral Daily   sodium bicarbonate  1,300 mg Oral BID   sodium zirconium cyclosilicate  20 g Oral BID   ticagrelor  90 mg Oral BID   zinc sulfate  220 mg Oral Daily  Social History   Tobacco Use   Smoking status: Never   Smokeless tobacco: Never  Vaping Use   Vaping Use: Never used  Substance Use Topics   Alcohol use: Not Currently   Drug use: Not Currently    Comment: OTC ASA    Family History  Problem Relation Age of Onset   Hypertension Mother    Arthritis Father    Hypertension Father    Hypertension Brother    Cancer Maternal Grandmother 4       unknown type of cancer   Depression Paternal Grandmother     Review of Systems  Constitutional: Negative for fever, chills, diaphoresis, activity change, appetite change, fatigue and unexpected weight change.  HENT: Negative for congestion, sore throat, rhinorrhea, sneezing, trouble swallowing and sinus pressure.  Eyes: Negative for photophobia and visual disturbance.  Respiratory:  Negative for cough, chest tightness, shortness of breath, wheezing and stridor.  Cardiovascular: Negative for chest pain, palpitations and leg swelling.  Gastrointestinal: Negative for nausea, vomiting, abdominal pain, diarrhea, constipation, blood in stool, abdominal distention and anal bleeding.  Genitourinary: Negative for dysuria, hematuria, flank pain and difficulty urinating.  Musculoskeletal: Negative for myalgias, back pain, joint swelling, arthralgias and gait problem.  Skin: left leg pain/oozing Neurological: Negative for dizziness, tremors, weakness and light-headedness.  Hematological: Negative for adenopathy. Does not bruise/bleed easily.  Psychiatric/Behavioral: Negative for behavioral problems, confusion, sleep disturbance, dysphoric mood, decreased concentration and agitation.    OBJECTIVE: Temp:  [97.3 F (36.3 C)-98.4 F (36.9 C)] 98.4 F (36.9 C) (08/02 0929) Pulse Rate:  [72-80] 80 (08/02 0929) Resp:  [16-19] 16 (08/02 0929) BP: (125-136)/(81-91) 130/82 (08/02 0929) SpO2:  [95 %-98 %] 96 % (08/02 0929) Weight:  [124.8 kg] 124.8 kg (08/01 2046) Physical Exam  Constitutional: He is oriented to person, place, and time. He appears well-developed and well-nourished. No distress.  HENT:  Mouth/Throat: Oropharynx is clear and moist. No oropharyngeal exudate.  Cardiovascular: Normal rate, regular rhythm and normal heart sounds. Exam reveals no gallop and no friction rub.  No murmur heard.  Pulmonary/Chest: Effort normal and breath sounds normal. No respiratory distress. He has no wheezes.  Abdominal: Soft. Bowel sounds are normal. He exhibits no distension. There is no tenderness.  Chest wall = left Hd line Ext= left BKA oozing on bandage Neurological: He is alert and oriented to person, place, and time.  Skin: Skin is warm and dry. No rash noted. No erythema.  Psychiatric: He has a normal mood and affect. His behavior is normal.   LABS: Results for orders placed or  performed during the hospital encounter of 10/17/20 (from the past 48 hour(s))  Glucose, capillary     Status: Abnormal   Collection Time: 11/05/20  5:02 PM  Result Value Ref Range   Glucose-Capillary 152 (H) 70 - 99 mg/dL    Comment: Glucose reference range applies only to samples taken after fasting for at least 8 hours.  Glucose, capillary     Status: Abnormal   Collection Time: 11/05/20  9:30 PM  Result Value Ref Range   Glucose-Capillary 189 (H) 70 - 99 mg/dL    Comment: Glucose reference range applies only to samples taken after fasting for at least 8 hours.  Renal function panel     Status: Abnormal   Collection Time: 11/06/20  1:54 AM  Result Value Ref Range   Sodium 135 135 - 145 mmol/L   Potassium 6.1 (H) 3.5 - 5.1 mmol/L   Chloride 105 98 -  111 mmol/L   CO2 20 (L) 22 - 32 mmol/L   Glucose, Bld 159 (H) 70 - 99 mg/dL    Comment: Glucose reference range applies only to samples taken after fasting for at least 8 hours.   BUN 74 (H) 6 - 20 mg/dL   Creatinine, Ser 3.39 (H) 0.61 - 1.24 mg/dL   Calcium 9.2 8.9 - 10.3 mg/dL   Phosphorus 4.8 (H) 2.5 - 4.6 mg/dL   Albumin 2.6 (L) 3.5 - 5.0 g/dL   GFR, Estimated 21 (L) >60 mL/min    Comment: (NOTE) Calculated using the CKD-EPI Creatinine Equation (2021)    Anion gap 10 5 - 15    Comment: Performed at [facility] [address] [place], [place] [address]  Glucose, capillary     Status: Abnormal   Collection Time: 11/06/20  6:29 AM  Result Value Ref Range   Glucose-Capillary 147 (H) 70 - 99 mg/dL    Comment: Glucose reference range applies only to samples taken after fasting for at least 8 hours.  Glucose, capillary     Status: Abnormal   Collection Time: 11/06/20 12:00 PM  Result Value Ref Range   Glucose-Capillary 160 (H) 70 - 99 mg/dL    Comment: Glucose reference range applies only to samples taken after fasting for at least 8 hours.  Basic metabolic panel     Status: Abnormal   Collection Time: 11/06/20  2:46 PM   Result Value Ref Range   Sodium 136 135 - 145 mmol/L   Potassium 5.7 (H) 3.5 - 5.1 mmol/L   Chloride 105 98 - 111 mmol/L   CO2 22 22 - 32 mmol/L   Glucose, Bld 178 (H) 70 - 99 mg/dL    Comment: Glucose reference range applies only to samples taken after fasting for at least 8 hours.   BUN 69 (H) 6 - 20 mg/dL   Creatinine, Ser 3.40 (H) 0.61 - 1.24 mg/dL   Calcium 9.5 8.9 - 10.3 mg/dL   GFR, Estimated 20 (L) >60 mL/min    Comment: (NOTE) Calculated using the CKD-EPI Creatinine Equation (2021)    Anion gap 9 5 - 15    Comment: Performed at [facility] [address] [place], [place] [address]  Glucose, capillary     Status: Abnormal   Collection Time: 11/06/20  5:08 PM  Result Value Ref Range   Glucose-Capillary 144 (H) 70 - 99 mg/dL    Comment: Glucose reference range applies only to samples taken after fasting for at least 8 hours.  Glucose, capillary     Status: Abnormal   Collection Time: 11/06/20  8:47 PM  Result Value Ref Range   Glucose-Capillary 148 (H) 70 - 99 mg/dL    Comment: Glucose reference range applies only to samples taken after fasting for at least 8 hours.  Renal function panel     Status: Abnormal   Collection Time: 11/07/20  5:27 AM  Result Value Ref Range   Sodium 134 (L) 135 - 145 mmol/L   Potassium 7.1 (HH) 3.5 - 5.1 mmol/L    Comment: NO VISIBLE HEMOLYSIS CRITICAL RESULT CALLED TO, READ BACK BY AND VERIFIED WITH: J. LAMBERANG, RN. '@0635'$  [id number] BLANKENSHIP R.     Chloride 105 98 - 111 mmol/L   CO2 20 (L) 22 - 32 mmol/L   Glucose, Bld 192 (H) 70 - 99 mg/dL    Comment: Glucose reference range applies only to samples taken after fasting for at least  8 hours.   BUN 78 (H) 6 - 20 mg/dL   Creatinine, Ser 3.80 (H) 0.61 - 1.24 mg/dL    Comment: DELTA CHECK NOTED   Calcium 9.4 8.9 - 10.3 mg/dL   Phosphorus 4.7 (H) 2.5 - 4.6 mg/dL   Albumin 2.8 (L) 3.5 - 5.0 g/dL   GFR, Estimated 18 (L) >60 mL/min    Comment: (NOTE) Calculated using the CKD-EPI  Creatinine Equation (2021)    Anion gap 9 5 - 15    Comment: Performed at [facility] [address] [place], [place] [address]  Glucose, capillary     Status: Abnormal   Collection Time: 11/07/20  6:53 AM  Result Value Ref Range   Glucose-Capillary 171 (H) 70 - 99 mg/dL    Comment: Glucose reference range applies only to samples taken after fasting for at least 8 hours.  Glucose, capillary     Status: Abnormal   Collection Time: 11/07/20 11:25 AM  Result Value Ref Range   Glucose-Capillary 158 (H) 70 - 99 mg/dL    Comment: Glucose reference range applies only to samples taken after fasting for at least 8 hours.  Renal function panel     Status: Abnormal   Collection Time: 11/07/20 12:37 PM  Result Value Ref Range   Sodium 134 (L) 135 - 145 mmol/L   Potassium 6.1 (H) 3.5 - 5.1 mmol/L   Chloride 106 98 - 111 mmol/L   CO2 19 (L) 22 - 32 mmol/L   Glucose, Bld 185 (H) 70 - 99 mg/dL    Comment: Glucose reference range applies only to samples taken after fasting for at least 8 hours.   BUN 79 (H) 6 - 20 mg/dL   Creatinine, Ser 4.13 (H) 0.61 - 1.24 mg/dL   Calcium 9.7 8.9 - 10.3 mg/dL   Phosphorus 4.8 (H) 2.5 - 4.6 mg/dL   Albumin 2.9 (L) 3.5 - 5.0 g/dL   GFR, Estimated 16 (L) >60 mL/min    Comment: (NOTE) Calculated using the CKD-EPI Creatinine Equation (2021)    Anion gap 9 5 - 15    Comment: Performed at [facility] [address] [place], [place] [address]    Assessment/Plan: 55yo M with HIV disease, with CD 4 count of 115/VL130, CKD 5 and persistent hyperkalemia. Presently on biktarvy, previously on triumeq+ cDRV, however he is now on brilinta which would interfere with PI based regimens(cobi or RTV).   HIV disease= We will plan on changing to Dovato plus doravirine which should be able to viral suppress patient  OI proph = continue with dapsone daily  Hyperkalemia = will stop biktarvy. Continue with diuresis, meds, kayexelate to decrease serum  potassium  BKA site = continue with daily dressing changes and evaluate from blood lose/s/blood clots-

## 2020-11-07 NOTE — Progress Notes (Signed)
PROGRESS NOTE    Keith [name]  [id number] DOB: 1966/02/21 DOA: 10/17/2020 [name], NP   Chief Complain:left heel ulcer  Brief Narrative:  Patient is a  55 year old male with past medical history of HIV, chronic kidney disease stage IIIb, type 2 diabetes mellitus, hypertension, hyperlipidemia, coronary artery disease, status post right below knee amputation who was sent to the emergency department by his orthopedic surgeon due to concern for left heel ulcer that has failed outpatient therapy.  MRI confirmed osteomyelitis.  He underwent left transtibial amputation by [name].  PT/OT recommended skilled nursing facility on discharge.  TOC consulted and following for disposition. Nephrology consulted  for worsening kidney function/hyperkalemia.  Underwent  temporary dialysis catheter placement for possible need of dialysis .  Nephrology closely following.  Kidney function has remained stable since last few days except for hyperkalemia.   Assessment & Plan:   Active Problems:   Elevated blood pressure reading with diagnosis of hypertension   Cellulitis   Ulcer of heel due to diabetes mellitus (HCC)   Renal insufficiency   Normochromic normocytic anemia   Cellulitis of left foot   Acute osteomyelitis of left calcaneus (HCC)   1.  AKI on Chronic kidney disease stage IIIb-IV/non-anion gap metabolic acidosis/Hyperkalemia Baseline creatinine around 2.5.  Creatinine bumped up to the range of 4 and creeped up.    Also started on sodium bicarb tablets for non-anion gap metabolic acidosis.  .   Underwent temporary dialysis catheter placement,plan for  dialysis initiation but holding off for now.  There was also plan for kidney biopsy, but now the plan on hold. Negative antiDNA/GBM antibodies .Hepatitis panel negative.  C4 level normal, C3 mildly elevated Renal ultrasound negative for hydronephrosis, consistent with chronic renal disease.  AKI thought to be secondary to ATN  secondary to hypotension He does not have a nephrologist established yet as an outpatient Potassium level in the range of 7 today. Given IV calcium gluconate, IV insulin 10 units, D50.  Continue Lokelma.  Continue Lokelma. He is also on tenofovir on his HAART regimen.  There is some suspicion that it might be contributing to hyperkalemia.  ID has been consulted if we can change the regimen for his HIV( discussed with Dr West Bali)  2. left foot ulcer Patient failed outpatient therapy with doxycycline Osteomyelitis was confirmed by MRI. Patient underwent left BKA October 27, 2020, had wound VAC,now d/ced Orthopedic were following, recommend outpatient follow-up.  3.  HIV stable Continue HAART regimen, ID consulted to check if we can change the regimen  4.  Diabetic nephropathy/diabetic neuropathy Continue home medications, on gabapentin  5.  Severely uncontrolled diabetes mellitus with hyperglycemia  last hemoglobin A1c was 14 Hospital course remarkable for low blood sugars.  Lantus on hold currently.  Currently only on sliding scale insulin. Likely noncompliant on insulin.  Was on 70/30 insulin at home  6.  Ambulatory dysfunction secondary to bilateral amputation PT /OT following.  Recommended skilled nursing facility on discharge.  TOC consulted  7.  Hypertension Antihypertensives D/Ced .BP  stable  8.  Normocytic anemia Hemoglobin currently stable in the range of 7-8.  Most likely associated  with chronic kidney disease.  9.  Morbid obesity BMI of 39         DVT prophylaxis:Heparin [place] Code Status: Full Family Communication: None at bedside Status is: Inpatient  Remains inpatient appropriate because:Unsafe d/c plan  Dispo:  Patient From:    Planned Disposition: [facility]  Medically  stable for discharge:  Not yet    Needs to get clearance from nephrology for discharge because of ongoing AKI, hyperkalemia.  Consultants: Orthopedics  Procedures:Left  BKA  Antimicrobials:  Anti-infectives (From admission, onward)    Start     Dose/Rate Route Frequency Ordered Stop   11/02/20 1130  ceFAZolin (ANCEF) IVPB 2g/100 mL premix        2 g 200 mL/hr over 30 Minutes Intravenous To Radiology 11/02/20 1042 11/02/20 1704   10/27/20 1245  ceFAZolin (ANCEF) IVPB 3g/100 mL premix        3 g 200 mL/hr over 30 Minutes Intravenous On call to O.R. 10/27/20 0755 10/27/20 1608   10/20/20 1415  linezolid (ZYVOX) tablet 600 mg  Status:  Discontinued        600 mg Oral Every 12 hours 10/20/20 1326 10/28/20 1115   10/19/20 1445  metroNIDAZOLE (FLAGYL) tablet 500 mg  Status:  Discontinued        500 mg Oral Every 12 hours 10/19/20 1347 10/28/20 1115   10/18/20 1600  ceFEPIme (MAXIPIME) 2 g in sodium chloride 0.9 % 100 mL IVPB  Status:  Discontinued        2 g 200 mL/hr over 30 Minutes Intravenous Every 12 hours 10/18/20 0414 10/28/20 1112   10/18/20 1000  dapsone tablet 100 mg        100 mg Oral Daily 10/18/20 0425     10/18/20 1000  bictegravir-emtricitabine-tenofovir AF (BIKTARVY) 50-200-25 MG per tablet 1 tablet        1 tablet Oral Daily 10/18/20 0610     10/18/20 0415  metroNIDAZOLE (FLAGYL) IVPB 500 mg  Status:  Discontinued        500 mg 100 mL/hr over 60 Minutes Intravenous Every 8 hours 10/18/20 0404 10/19/20 1347   10/18/20 0415  ceFEPIme (MAXIPIME) 2 g in sodium chloride 0.9 % 100 mL IVPB        2 g 200 mL/hr over 30 Minutes Intravenous  Once 10/18/20 0414 10/18/20 0500   10/18/20 0345  vancomycin (VANCOREADY) IVPB 2000 mg/400 mL        2,000 mg 200 mL/hr over 120 Minutes Intravenous  Once 10/18/20 0335 10/18/20 [id number]       Subjective:  Patient seen and examined at the bedside this morning.  Hemodynamically stable during evaluation.  Complains of tiredness today.  Denies any other complaints  Objective: Vitals:   11/06/20 1003 11/06/20 1839 11/06/20 2046 11/07/20 0412  BP: 107/81 125/81 136/89 (!) 133/91  Pulse: 68 77 72 78  Resp: '18  19 18 18  '$ Temp: 98.1 F (36.7 C) (!) 97.3 F (36.3 C) 98.4 F (36.9 C) 98 F (36.7 C)  TempSrc: Oral Oral  Oral  SpO2: 97% 96% 95% 98%  Weight:   124.8 kg   Height:        Intake/Output Summary (Last 24 hours) at 11/07/2020 [id number] Last data filed at 11/06/2020 2046 Gross per 24 hour  Intake 960 ml  Output 850 ml  Net 110 ml   Filed Weights   10/22/20 0525 10/26/20 2221 11/06/20 2046  Weight: 124.8 kg 124.8 kg 124.8 kg    Examination:  General exam: Overall comfortable, not in distress,obese HEENT: PERRL Respiratory system:  no wheezes or crackles  Cardiovascular system: S1 & S2 heard, RRR.  Tunneled dialysis catheter on the right chest Gastrointestinal system: Abdomen is nondistended, soft and nontender. Central nervous system: Alert and oriented Extremities: No edema, no clubbing ,no cyanosis, bilateral  BKA Skin: No rashes, no ulcers,no icterus    Data Reviewed: I have personally reviewed following labs and imaging studies  CBC: Recent Labs  Lab 11/02/20 0345 11/03/20 0425 11/05/20 0206  WBC 9.1 8.9 8.5  NEUTROABS  --  6.4 5.5  HGB 7.5* 8.0* 7.6*  HCT 24.0* 25.3* 25.0*  MCV 84.8 84.3 85.3  PLT 374 364 [id number]   Basic Metabolic Panel: Recent Labs  Lab 11/03/20 0425 11/04/20 0124 11/04/20 1306 11/05/20 0206 11/06/20 0154 11/06/20 1446 11/07/20 0527  NA 131* 132* 130* 133* 135 136 134*  K 5.1 6.1* 5.7* 5.8* 6.1* 5.7* 7.1*  CL 101 103 101 101 105 105 105  CO2 20* 19* 19* 22 20* 22 20*  GLUCOSE 108* 108* 127* 161* 159* 178* 192*  BUN 84* 88* 82* 79* 74* 69* 78*  CREATININE 4.25* 4.17* 3.74* 3.60* 3.39* 3.40* 3.80*  CALCIUM 9.0 9.1 9.3 9.2 9.2 9.5 9.4  PHOS 5.4* 5.5*  --  5.1* 4.8*  --  4.7*   GFR: Estimated Creatinine Clearance: 29.1 mL/min (A) (by C-G formula based on SCr of 3.8 mg/dL (H)). Liver Function Tests: Recent Labs  Lab 11/03/20 0425 11/04/20 0124 11/05/20 0206 11/06/20 0154 11/07/20 0527  ALBUMIN 2.4* 2.5* 2.5* 2.6* 2.8*   No results for  input(s): LIPASE, AMYLASE in the last 168 hours. No results for input(s): AMMONIA in the last 168 hours. Coagulation Profile: No results for input(s): INR, PROTIME in the last 168 hours. Cardiac Enzymes: No results for input(s): CKTOTAL, CKMB, CKMBINDEX, TROPONINI in the last 168 hours. BNP (last 3 results) No results for input(s): PROBNP in the last 8760 hours. HbA1C: No results for input(s): HGBA1C in the last 72 hours. CBG: Recent Labs  Lab 11/06/20 0629 11/06/20 1200 11/06/20 1708 11/06/20 2047 11/07/20 0653  GLUCAP 147* 160* 144* 148* 171*   Lipid Profile: No results for input(s): CHOL, HDL, LDLCALC, TRIG, CHOLHDL, LDLDIRECT in the last 72 hours. Thyroid Function Tests: No results for input(s): TSH, T4TOTAL, FREET4, T3FREE, THYROIDAB in the last 72 hours. Anemia Panel: No results for input(s): VITAMINB12, FOLATE, FERRITIN, TIBC, IRON, RETICCTPCT in the last 72 hours. Sepsis Labs: No results for input(s): PROCALCITON, LATICACIDVEN in the last 168 hours.  No results found for this or any previous visit (from the past 240 hour(s)).        Radiology Studies: No results found.      Scheduled Meds:  allopurinol  100 mg Oral Daily   vitamin C  1,000 mg Oral Daily   aspirin EC  81 mg Oral Daily   atorvastatin  80 mg Oral Daily   bictegravir-emtricitabine-tenofovir AF  1 tablet Oral Daily   Chlorhexidine Gluconate Cloth  6 each Topical Q0600   dapsone  100 mg Oral Daily   escitalopram  20 mg Oral Daily   gabapentin  100 mg Oral TID   heparin injection (subcutaneous)  5,000 Units Subcutaneous Q8H   icosapent Ethyl  2 g Oral BID   insulin aspart  0-15 Units Subcutaneous TID WC   insulin aspart  0-5 Units Subcutaneous QHS   nutrition supplement (JUVEN)  1 packet Oral BID BM   oxybutynin  2.5 mg Oral BID   pantoprazole  40 mg Oral Daily   sodium bicarbonate  1,300 mg Oral BID   sodium zirconium cyclosilicate  20 g Oral BID   ticagrelor  90 mg Oral BID   zinc  sulfate  220 mg Oral Daily   Continuous Infusions:  calcium gluconate  LOS: 20 days    Time spent: 25 mins.More than 50% of that time was spent in counseling and/or coordination of care.      Shelly Coss, MD [facility] P8/05/2020, 8:22 AM

## 2020-11-07 NOTE — Progress Notes (Signed)
[name] Progress Note   Assessment/Plan: [name] with PMHx of HIV, CKDIIIb, hypertension, CAD s/p stent, type II diabetes mellitus s/p R BKA admitted for left diabetic heel ulcer concerning for osteomyelitis s/p L BKA on 7/22. Hospital course complicated by AKI on CKD.  AKI on CKDIIIb - baseline sCr ~2.5; creatinine peaked at 4.9, renal ultrasound negative for hydronephrosis, C3/C4 complement wnl. Hepatitis panel negative. anti-DNA, GBM antibodies, ANCA all neg. there was concern with progression to ESRD but kidney function has been improving with fair urine output - Will leave RIJ TC in place for now in case he goes the wrong direction given his persistent hyperkalemia. Most likely ATN 2/2 hypotension -It is sometimes possible for tenofovir to cause tubular dysfunction and CKD. Will see if ID maybe could consider alternate medication  Hyperkalemia -likely multifactorial but persistently not improving. We have been using depleting agents with minimal progress. It is still possible he has high total body potassium stores but given his lack of improvement I wonder if he may have some degree of a Type IV RTA. No super obvious meds but tenofovir is known to cause tubular dysfunction (typically proximal) but rarely may affect the distal tubule. - Continue lokelma BID and increase dose - Continue Na bicarb at '1300mg'$  BID and given Bicarb infusion for 1 day - Start chlorthalidone 12.'5mg'$  daily to help with K waisting - Continue low K diet - Request help from ID on whether alternative to Tenofovir is an option for his HIV  Non-anion gap acidosis -likely secondary to CKD with AKI.  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  Hypertension: Holding some BP meds but start  chlorthalidone as above Normocytic anemia - continue to monitor   Subjective:   The patient states he continues to feel well.  Trying his best to adhere to the diet.  Persistent hyperkalemia this morning with potassium of 7.1  Objective Vitals:   11/06/20 1839 11/06/20 2046 11/07/20 0412 11/07/20 0929  BP: 125/81 136/89 (!) 133/91 130/82  Pulse: 77 72 78 80  Resp: '19 18 18 16  '$ Temp: (!) 97.3 F (36.3 C) 98.4 F (36.9 C) 98 F (36.7 C) 98.4 F (36.9 C)  TempSrc: Oral  Oral   SpO2: 96% 95% 98% 96%  Weight:  124.8 kg    Height:       Physical Exam General: resting comfortably in bed, no acute distress Heart: Normal rate, no audible murmur Lungs: CTAB on RA;  Abdomen: soft, nondistended, nontender, +BS Extremities: bilateral BKA, wound vac on LLE  Dialysis Access: tunneled HD cath R IJ   Additional Objective Labs: Basic Metabolic Panel: Recent Labs  Lab 11/05/20 0206 11/06/20 0154 11/06/20 1446 11/07/20 0527  NA 133* 135 136 134*  K 5.8* 6.1* 5.7* 7.1*  CL 101 105 105 105  CO2 22 20* 22 20*  GLUCOSE 161* 159* 178* 192*  BUN 79* 74* 69* 78*  CREATININE 3.60* 3.39* 3.40* 3.80*  CALCIUM 9.2 9.2 9.5 9.4  PHOS 5.1* 4.8*  --  4.7*   Liver Function Tests: Recent Labs  Lab 11/05/20 0206 11/06/20 0154 11/07/20 0527  ALBUMIN 2.5* 2.6* 2.8*   No results for input(s): LIPASE, AMYLASE in the last 168 hours. CBC: Recent Labs  Lab 11/02/20 0345 11/03/20  0425 11/05/20 0206  WBC 9.1 8.9 8.5  NEUTROABS  --  6.4 5.5  HGB 7.5* 8.0* 7.6*  HCT 24.0* 25.3* 25.0*  MCV 84.8 84.3 85.3  PLT 374 364 330   Blood Culture    Component Value Date/Time   SDES BLOOD RIGHT WRIST 10/18/2020 0439   SPECREQUEST  10/18/2020 0439    BOTTLES DRAWN AEROBIC AND ANAEROBIC Blood Culture results may not be optimal due to an inadequate volume of blood received in culture bottles   CULT  10/18/2020 0439    NO GROWTH 5 DAYS Performed at [facility] [address], [place],  [place] [address]    REPTSTATUS 10/23/2020 FINAL 10/18/2020 0439    Cardiac Enzymes: No results for input(s): CKTOTAL, CKMB, CKMBINDEX, TROPONINI in the last 168 hours. CBG: Recent Labs  Lab 11/06/20 0629 11/06/20 1200 11/06/20 1708 11/06/20 2047 11/07/20 0653  GLUCAP 147* 160* 144* 148* 171*   Iron Studies: No results for input(s): IRON, TIBC, TRANSFERRIN, FERRITIN in the last 72 hours.  Studies/Results: No results found. Medications:   allopurinol  100 mg Oral Daily   vitamin C  1,000 mg Oral Daily   aspirin EC  81 mg Oral Daily   atorvastatin  80 mg Oral Daily   bictegravir-emtricitabine-tenofovir AF  1 tablet Oral Daily   Chlorhexidine Gluconate Cloth  6 each Topical Q0600   chlorthalidone  12.5 mg Oral Daily   dapsone  100 mg Oral Daily   escitalopram  20 mg Oral Daily   gabapentin  100 mg Oral TID   heparin injection (subcutaneous)  5,000 Units Subcutaneous Q8H   icosapent Ethyl  2 g Oral BID   insulin aspart  0-15 Units Subcutaneous TID WC   insulin aspart  0-5 Units Subcutaneous QHS   nutrition supplement (JUVEN)  1 packet Oral BID BM   oxybutynin  2.5 mg Oral BID   pantoprazole  40 mg Oral Daily   sodium bicarbonate  1,300 mg Oral BID   sodium zirconium cyclosilicate  20 g Oral BID   ticagrelor  90 mg Oral BID   zinc sulfate  220 mg Oral Daily

## 2020-11-07 NOTE — Progress Notes (Signed)
Physical Therapy Treatment Patient Details Name: Keith [name] MRN: [name] DOB: 18-Apr-1965 Today's Date: 11/07/2020    History of Present Illness Keith [name] is a 55 y.o. male  who is sent to the ED by his orthopedic surgery provider due to concern for left heel ulcer that had failed outpatient therapy. with medical history significant for HIV, CKD 3B, type 2 diabetes, hypertension, hyperlipidemia, coronary artery disease, status post right below the knee amputation, now s/p L BKA 10/27/20.    PT Comments    Pt very lethargic on entry, with c/o of phantom pain. Bed found to be saturated in urine. Pt requires maxAx2 to come to EoB. Residual limb protector slid off in sitting. Initial plan for transfer to chair to clean up, however pt very weak with increased difficulty with command follow. Pt requiring outside assist to maintain safe seated balance EoB. Requiring NT assist for cleaning bed to return to supine. Pt participated in limited therex. Pt requires total A to return to supine and modA for rolling to clean and place clean linens. Once cleaned PT replacing limb protector and shinker found to be blood soaked and removed. Dressing saturated in blood. RN notified. D/c plans remain appropriate once medically stable. PT will continue to follow acutely.    Follow Up Recommendations  SNF     Equipment Recommendations  Wheelchair (measurements PT);Wheelchair cushion (measurements PT)       Precautions / Restrictions Precautions Precautions: Fall Precaution Comments: B BKA, prosthesis for Right Required Braces or Orthoses: Other Brace Other Brace: limb protector on left Restrictions Weight Bearing Restrictions: Yes Other Position/Activity Restrictions: NWB on left    Mobility  Bed Mobility Overal bed mobility: Needs Assistance Bed Mobility: Supine to Sit;Sit to Supine;Rolling Rolling: Mod assist   Supine to sit: Max assist;+2 for physical assistance Sit to supine: Total assist    General bed mobility comments: maxAx2 to come to sitting EoB, once EoB limb protector fell off total A to return to supine, modA and maximal cuing for rolling L and R    Transfers                 General transfer comment: Plan was to perform scoot transfer to chair, however once in sitting pt assessed to be too lethargic and weak to attempt safely         Balance Overall balance assessment: Needs assistance Sitting-balance support: Bilateral upper extremity supported;Feet unsupported Sitting balance-Leahy Scale: Poor Sitting balance - Comments: requires outside assist to maintain balance EoB                                    Cognition Arousal/Alertness: Lethargic Behavior During Therapy: Flat affect Overall Cognitive Status: No family/caregiver present to determine baseline cognitive functioning Area of Impairment: Safety/judgement                       Following Commands: Follows multi-step commands with increased time;Follows one step commands consistently Safety/Judgement: Decreased awareness of safety   Problem Solving: Requires verbal cues;Requires tactile cues General Comments: pt very lethargic today. Bed is entirely soaked in urine and pt unaware, urinates twice more while getting him cleaned up despite request to let staff know if he has to urinate      Exercises Amputee Exercises Knee Flexion: AROM;Both;10 reps;Seated Knee Extension: AROM;Both;10 reps    General Comments General comments (skin integrity, edema,  etc.): Limb protector fell off with coming to EoB, once back in bed and cleaned up PT went to replace limb protector and shrinker found to be soaked in blood. Shrinker removed and dressing noted to be saturated. RN notified      Pertinent Vitals/Pain Pain Assessment: Faces Faces Pain Scale: Hurts little more Pain Location: L LE Pain Descriptors / Indicators: Discomfort;Sore Pain Intervention(s): Limited activity within  patient's tolerance;Monitored during session;Repositioned     PT Goals (current goals can now be found in the care plan section) Acute Rehab PT Goals Patient Stated Goal: to get left prosthetic, to walk again PT Goal Formulation: With patient Time For Goal Achievement: 11/10/20 Potential to Achieve Goals: Fair Progress towards PT goals: Not progressing toward goals - comment (too lethargic)    Frequency    Min 3X/week      PT Plan Current plan remains appropriate       AM-PAC PT "6 Clicks" Mobility   Outcome Measure  Help needed turning from your back to your side while in a flat bed without using bedrails?: A Little Help needed moving from lying on your back to sitting on the side of a flat bed without using bedrails?: Total Help needed moving to and from a bed to a chair (including a wheelchair)?: Total Help needed standing up from a chair using your arms (e.g., wheelchair or bedside chair)?: Total Help needed to walk in hospital room?: Total Help needed climbing 3-5 steps with a railing? : Total 6 Click Score: 8    End of Session   Activity Tolerance: Patient limited by lethargy (could not participate) Patient left: with call bell/phone within reach;with bed alarm set;in bed Nurse Communication: Mobility status PT Visit Diagnosis: Unsteadiness on feet (R26.81);Other abnormalities of gait and mobility (R26.89);Pain;Muscle weakness (generalized) (M62.81) Pain - Right/Left: Left Pain - part of body: Leg     Time: [id number] PT Time Calculation (min) (ACUTE ONLY): 41 min  Charges:  $Therapeutic Exercise: 8-22 mins $Therapeutic Activity: 23-37 mins                     Keith [name] B. Migdalia Dk PT, DPT Acute Rehabilitation Services Pager [phone] Office [phone]    [place] 11/07/2020, 11:14 AM

## 2020-11-07 NOTE — Progress Notes (Signed)
1100 ml urine returned with in and out cath.  Patient tolerated the procedure well.

## 2020-11-08 DIAGNOSIS — N179 Acute kidney failure, unspecified: Secondary | ICD-10-CM | POA: Diagnosis not present

## 2020-11-08 DIAGNOSIS — Z89512 Acquired absence of left leg below knee: Secondary | ICD-10-CM

## 2020-11-08 DIAGNOSIS — B20 Human immunodeficiency virus [HIV] disease: Secondary | ICD-10-CM | POA: Diagnosis not present

## 2020-11-08 DIAGNOSIS — N289 Disorder of kidney and ureter, unspecified: Secondary | ICD-10-CM | POA: Diagnosis not present

## 2020-11-08 DIAGNOSIS — M86172 Other acute osteomyelitis, left ankle and foot: Secondary | ICD-10-CM | POA: Diagnosis not present

## 2020-11-08 LAB — RENAL FUNCTION PANEL
Albumin: 2.6 g/dL — ABNORMAL LOW (ref 3.5–5.0)
Anion gap: 14 (ref 5–15)
BUN: 79 mg/dL — ABNORMAL HIGH (ref 6–20)
CO2: 23 mmol/L (ref 22–32)
Calcium: 9.6 mg/dL (ref 8.9–10.3)
Chloride: 98 mmol/L (ref 98–111)
Creatinine, Ser: 4.12 mg/dL — ABNORMAL HIGH (ref 0.61–1.24)
GFR, Estimated: 16 mL/min — ABNORMAL LOW (ref >60)
Glucose, Bld: 208 mg/dL — ABNORMAL HIGH (ref 70–99)
Phosphorus: 4.9 mg/dL — ABNORMAL HIGH (ref 2.5–4.6)
Potassium: 5 mmol/L (ref 3.5–5.1)
Sodium: 135 mmol/L (ref 135–145)

## 2020-11-08 LAB — GLUCOSE, CAPILLARY
Glucose-Capillary: 223 mg/dL — ABNORMAL HIGH (ref 70–99)
Glucose-Capillary: 236 mg/dL — ABNORMAL HIGH (ref 70–99)
Glucose-Capillary: 189 mg/dL — ABNORMAL HIGH (ref 70–99)
Glucose-Capillary: 151 mg/dL — ABNORMAL HIGH (ref 70–99)

## 2020-11-08 LAB — ANTINUCLEAR ANTIBODIES, IFA: ANA Ab, IFA: NEGATIVE

## 2020-11-08 MED ORDER — SODIUM ZIRCONIUM CYCLOSILICATE 10 G PO PACK
10.0000 g | PACK | Freq: Every day | ORAL | Status: DC
  Administered 2020-11-08: 10 g via ORAL
  Filled 2020-11-08: qty 1

## 2020-11-08 MED ORDER — DIPHENOXYLATE-ATROPINE 2.5-0.025 MG/5ML PO LIQD
5.0000 mL | Freq: Four times a day (QID) | ORAL | Status: DC | PRN
  Administered 2020-12-16: 5 mL via ORAL
  Filled 2020-11-08: qty 5

## 2020-11-08 NOTE — Progress Notes (Signed)
Occupational Therapy Treatment Patient Details Name: Keith [name] MRN: [id number] DOB: 1965-06-30 Today's Date: 11/08/2020    History of present illness Keith [name] is a 55 y.o. male  who is sent to the ED by his orthopedic surgery provider due to concern for left heel ulcer that had failed outpatient therapy. with medical history significant for HIV, CKD 3B, type 2 diabetes, hypertension, hyperlipidemia, coronary artery disease, status post right below the knee amputation, now s/p L BKA 10/27/20.   OT comments  Pr. Was lethargic today and was complaining of weakness. Pt. Refused to attempt eob sitting secondary to weakness. Pt. Was Max A of 2 for rolling in bed for bathing and linen change. Pt. Will continue to be followed for OT while in the hospital.   Follow Up Recommendations  SNF    Equipment Recommendations  None recommended by OT    Recommendations for Other Services      Precautions / Restrictions Precautions Precautions: Fall Precaution Comments: B BKA, prosthesis for Right Other Brace: limb protector on left Restrictions Weight Bearing Restrictions: Yes LLE Weight Bearing: Non weight bearing       Mobility Bed Mobility Overal bed mobility: Needs Assistance   Rolling: Max assist;+2 for physical assistance              Transfers                      Balance                                           ADL either performed or assessed with clinical judgement   ADL Overall ADL's : Needs assistance/impaired Eating/Feeding: Set up;Sitting   Grooming: Set up;Bed level   Upper Body Bathing: Set up;Supervision/ safety;Bed level   Lower Body Bathing: Moderate assistance;Bed level   Upper Body Dressing : Minimal assistance;Bed level   Lower Body Dressing: Maximal assistance;Bed level               Functional mobility during ADLs:  (Pt. refused to attempt to sit eob stating he felt too weak.) General ADL Comments: Pt.  required cues to increase coverage area while bathing.     Vision   Vision Assessment?: No apparent visual deficits   Perception     Praxis      Cognition Arousal/Alertness: Lethargic Behavior During Therapy: Flat affect Overall Cognitive Status: No family/caregiver present to determine baseline cognitive functioning Area of Impairment: Safety/judgement                       Following Commands: Follows multi-step commands with increased time;Follows one step commands consistently Safety/Judgement: Decreased awareness of safety   Problem Solving: Requires verbal cues General Comments: Pt. was incontinent of urine and bowel and did not request to clean up.        Exercises     Shoulder Instructions       General Comments      Pertinent Vitals/ Pain       Pain Assessment: 0-10 Pain Score: 4  Pain Location: L LE Pain Descriptors / Indicators: Aching Pain Intervention(s): RN gave pain meds during session  Home Living  Prior Functioning/Environment              Frequency  Min 2X/week        Progress Toward Goals  OT Goals(current goals can now be found in the care plan section)  Progress towards OT goals: Progressing toward goals  Acute Rehab OT Goals Patient Stated Goal: get better OT Goal Formulation: With patient Time For Goal Achievement: 11/11/20 Potential to Achieve Goals: Good ADL Goals Pt Will Perform Lower Body Bathing: with min assist;sitting/lateral leans Pt Will Perform Upper Body Dressing: with set-up;sitting Pt Will Perform Lower Body Dressing: with min assist;sitting/lateral leans Pt Will Transfer to Toilet: with min assist;stand pivot transfer Pt Will Perform Toileting - Clothing Manipulation and hygiene: with set-up;sitting/lateral leans  Plan Discharge plan remains appropriate    Co-evaluation                 AM-PAC OT "6 Clicks" Daily Activity      Outcome Measure   Help from another person eating meals?: None Help from another person taking care of personal grooming?: A Little Help from another person toileting, which includes using toliet, bedpan, or urinal?: Total Help from another person bathing (including washing, rinsing, drying)?: A Lot Help from another person to put on and taking off regular upper body clothing?: A Little Help from another person to put on and taking off regular lower body clothing?: A Lot 6 Click Score: 15    End of Session    OT Visit Diagnosis: Other abnormalities of gait and mobility (R26.89);Muscle weakness (generalized) (M62.81);Pain Pain - Right/Left: Left Pain - part of body: Leg   Activity Tolerance Patient limited by lethargy   Patient Left in bed;with call bell/phone within reach;with bed alarm set   Nurse Communication  (ok therapy)        Time: 0825-0920 OT Time Calculation (min): 55 min  Charges: OT General Charges $OT Visit: 1 Visit OT Treatments $Self Care/Home Management : 38-52 mins $Therapeutic Activity: 8-22 mins  [name] OT/L    [name] 11/08/2020, 9:32 AM

## 2020-11-08 NOTE — Progress Notes (Signed)
[facility] Progress Note   Assessment/Plan: [name] with PMHx of HIV, CKDIIIb, hypertension, CAD s/p stent, type II diabetes mellitus s/p R BKA admitted for left diabetic heel ulcer concerning for osteomyelitis s/p L BKA on 7/22. Hospital course complicated by AKI on CKD.  AKI on CKDIIIb - baseline sCr ~2.5; creatinine peaked at 4.9 and improved but now with subsequent worsening.  Initial work-up with renal ultrasound negative for hydronephrosis, C3/C4 complement wnl. Hepatitis panel negative. anti-DNA, GBM antibodies, ANCA all neg. there was concern with progression to ESRD but kidney function but he improved.  Now with creatinine then worsened again likely related to obstruction as below - Will leave RIJ TC in place for now in case he goes the wrong direction given his persistent hyperkalemia. Most likely ATN 2/2 hypotension initially but now with some obstruction -ID help transition off tenofovir given concern for tubular dysfunction  Hyperkalemia -very difficult to treat with type IV RTA pattern.  Likely associated with AKI and CKD but also obstruction as below -Decrease Lokelma to 10 g daily - Continue Na bicarb at '1300mg'$  BID -Continue chlorthalidone 12.'5mg'$  daily to help with K waisting - Continue low K diet -Management of obstruction as below -Appreciate help from ID switching off cidofovir  Non-anion gap acidosis -bicarb supplementation.  Hopefully will improve with kidney function Urinary obstruction: Required in and out catheterization yesterday.  Plan for Foley catheter if he is unable to void consistently.  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  Hypertension: Holding some BP meds but start chlorthalidone as above Normocytic anemia - continue to monitor    Subjective:   Patient states he feels well with no complaints.  Potassium down to 5 today.  Yesterday bladder scan showed greater than a liter of urine in his bladder.  He underwent in and out catheterization with return of 1.1 L of urine.  Serial bladder scans have demonstrated around 300 cc of urine.  Plan is to place Foley catheter if PVRs remain greater than 300.  Objective Vitals:   11/07/20 1759 11/07/20 2054 11/08/20 0449 11/08/20 0816  BP: (!) 156/86 (!) 147/83 114/73 122/76  Pulse: 83 88 78 85  Resp: '18 18 19 15  '$ Temp: 98.5 F (36.9 C) 98.6 F (37 C) 98.3 F (36.8 C) 99.1 F (37.3 C)  TempSrc: Oral Oral  Oral  SpO2: 97% 98% 96% 100%  Weight:      Height:       Physical Exam General: resting comfortably in bed, no acute distress Heart: Normal rate, no audible murmur Lungs: CTAB on RA;  Abdomen: soft, nondistended, nontender, +BS Extremities: bilateral BKA, wound vac on LLE  Dialysis Access: tunneled HD cath R IJ   Additional Objective Labs: Basic Metabolic Panel: Recent Labs  Lab 11/07/20 0527 11/07/20 1237 11/08/20 0315  NA 134* 134* 135  K 7.1* 6.1* 5.0  CL 105 106 98  CO2 20* 19* 23  GLUCOSE 192* 185* 208*  BUN 78* 79* 79*  CREATININE 3.80* 4.13* 4.12*  CALCIUM 9.4 9.7 9.6  PHOS 4.7* 4.8* 4.9*   Liver Function Tests: Recent Labs  Lab 11/07/20 0527 11/07/20 1237 11/08/20 0315  ALBUMIN 2.8* 2.9* 2.6*   No results for input(s): LIPASE, AMYLASE in the last 168 hours. CBC: Recent Labs  Lab 11/02/20 0345 11/03/20  0425 11/05/20 0206  WBC 9.1 8.9 8.5  NEUTROABS  --  6.4 5.5  HGB 7.5* 8.0* 7.6*  HCT 24.0* 25.3* 25.0*  MCV 84.8 84.3 85.3  PLT 374 364 330   Blood Culture    Component Value Date/Time   SDES BLOOD RIGHT WRIST 10/18/2020 0439   SPECREQUEST  10/18/2020 0439    BOTTLES DRAWN AEROBIC AND ANAEROBIC Blood Culture results may not be optimal due to an inadequate volume of blood received in culture bottles   CULT  10/18/2020 0439     NO GROWTH 5 DAYS Performed at [facility] [address], [place], [place] [address]    REPTSTATUS 10/23/2020 FINAL 10/18/2020 0439    Cardiac Enzymes: No results for input(s): CKTOTAL, CKMB, CKMBINDEX, TROPONINI in the last 168 hours. CBG: Recent Labs  Lab 11/07/20 0653 11/07/20 1125 11/07/20 1627 11/07/20 2056 11/08/20 0647  GLUCAP 171* 158* 171* 178* 223*   Iron Studies: No results for input(s): IRON, TIBC, TRANSFERRIN, FERRITIN in the last 72 hours.  Studies/Results: No results found. Medications:  sodium bicarbonate 150 mEq in D5W infusion 100 mL/hr at 11/08/20 0244    allopurinol  100 mg Oral Daily   vitamin C  1,000 mg Oral Daily   aspirin EC  81 mg Oral Daily   atorvastatin  80 mg Oral Daily   Chlorhexidine Gluconate Cloth  6 each Topical Q0600   chlorthalidone  12.5 mg Oral Daily   dapsone  100 mg Oral Daily   dolutegravir  50 mg Oral Daily   doravirine  100 mg Oral Daily   escitalopram  20 mg Oral Daily   gabapentin  100 mg Oral TID   heparin injection (subcutaneous)  5,000 Units Subcutaneous Q8H   icosapent Ethyl  2 g Oral BID   insulin aspart  0-15 Units Subcutaneous TID WC   insulin aspart  0-5 Units Subcutaneous QHS   lamiVUDine  300 mg Oral Daily   nutrition supplement (JUVEN)  1 packet Oral BID BM   oxybutynin  2.5 mg Oral BID   pantoprazole  40 mg Oral Daily   sodium bicarbonate  1,300 mg Oral BID   sodium zirconium cyclosilicate  10 g Oral Daily   ticagrelor  90 mg Oral BID   zinc sulfate  220 mg Oral Daily

## 2020-11-08 NOTE — Progress Notes (Signed)
14 fr. Urinary catheter placed, 1100 ml dark urine returned, patient tolerated well.

## 2020-11-08 NOTE — Progress Notes (Signed)
[facility]    Date of Admission:  10/17/2020      ID: Keith [name] is a 55 y.o. male with   Active Problems:   Elevated blood pressure reading with diagnosis of hypertension   Cellulitis   Ulcer of heel due to diabetes mellitus (HCC)   Renal insufficiency   Normochromic normocytic anemia   Cellulitis of left foot   Acute osteomyelitis of left calcaneus (HCC)    Subjective: Some left leg pain.  Medications:   allopurinol  100 mg Oral Daily   vitamin C  1,000 mg Oral Daily   aspirin EC  81 mg Oral Daily   atorvastatin  80 mg Oral Daily   Chlorhexidine Gluconate Cloth  6 each Topical Q0600   chlorthalidone  12.5 mg Oral Daily   dapsone  100 mg Oral Daily   dolutegravir  50 mg Oral Daily   doravirine  100 mg Oral Daily   escitalopram  20 mg Oral Daily   gabapentin  100 mg Oral TID   heparin injection (subcutaneous)  5,000 Units Subcutaneous Q8H   icosapent Ethyl  2 g Oral BID   insulin aspart  0-15 Units Subcutaneous TID WC   insulin aspart  0-5 Units Subcutaneous QHS   lamiVUDine  300 mg Oral Daily   nutrition supplement (JUVEN)  1 packet Oral BID BM   oxybutynin  2.5 mg Oral BID   pantoprazole  40 mg Oral Daily   sodium bicarbonate  1,300 mg Oral BID   sodium zirconium cyclosilicate  10 g Oral Daily   ticagrelor  90 mg Oral BID   zinc sulfate  220 mg Oral Daily    Objective: Vital signs in last 24 hours: Temp:  [98.3 F (36.8 C)-99.1 F (37.3 C)] 99.1 F (37.3 C) (08/03 0816) Pulse Rate:  [78-88] 85 (08/03 0816) Resp:  [15-19] 15 (08/03 0816) BP: (114-156)/(73-86) 122/76 (08/03 0816) SpO2:  [96 %-100 %] 100 % (08/03 0816)   Physical Exam  Constitutional: He is oriented to person, place, and time. He appears well-developed and well-nourished. No distress.  HENT:  Mouth/Throat: Oropharynx is clear and moist. No oropharyngeal exudate.  Cardiovascular: Normal rate, regular rhythm and normal heart sounds. Exam reveals no gallop  and no friction rub.  No murmur heard.  Pulmonary/Chest: Effort normal and breath sounds normal. No respiratory distress. He has no wheezes.  Abdominal: Soft. Bowel sounds are normal. He exhibits no distension. There is no tenderness.  Lymphadenopathy:  He has no cervical adenopathy.  Ext: right bka-healed. Left bka(most recent) staples in place still oozing on distal margins. Neurological: He is alert and oriented to person, place, and time.  Skin: Skin is warm and dry. No rash noted. No erythema.  Psychiatric: He has a normal mood and affect. His behavior is normal.    Lab Results Recent Labs    11/07/20 1237 11/08/20 0315  NA 134* 135  K 6.1* 5.0  CL 106 98  CO2 19* 23  BUN 79* 79*  CREATININE 4.13* 4.12*   Liver Panel Recent Labs    11/07/20 1237 11/08/20 0315  ALBUMIN 2.9* 2.6*   Sedimentation Rate No results for input(s): ESRSEDRATE in the last 72 hours. C-Reactive Protein No results for input(s): CRP in the last 72 hours.  Microbiology:  Studies/Results: No results found.   Assessment/Plan: Hyperkalemia = improving  Acute on chronic kidney disease= thought to be due to ATN +/- impact for TAF.  HIV disease = have changed his regimen to be Tenofovir based free. Now on tivicay, doravirine, lamivudine.  Continue on dapsone for oi proph  Recent left BKA = incision is still oozing blood. Continue to do dressing changes BID. Would ask ortho to stop by to evaluate since it has been 10 days + since amputation. Just to see if it appears consistent with healing properly. Does not appear infected but malodorous.(Unclear if from stump vs recent bowel movement)  [facility] Cell: [phone] Pager: [phone]  11/08/2020, 4:13 PM

## 2020-11-08 NOTE — Progress Notes (Signed)
PROGRESS NOTE    Keith [name]   [id number]  DOB: Aug 11, 1965  DOA: 10/17/2020 [name], NP   Brief Narrative:  Keith [name] a  55 year old male with past medical history of HIV, chronic kidney disease stage IIIb, type 2 diabetes mellitus, hypertension, hyperlipidemia, coronary artery disease, status post right below knee amputation who was sent to the emergency department by his orthopedic surgeon due to concern for left heel ulcer that has failed outpatient therapy.  MRI confirmed osteomyelitis.  He underwent left transtibial amputation by [name].  PT/OT recommended skilled nursing facility on discharge.  TOC consulted and following for disposition. Nephrology consulted  for worsening kidney function/hyperkalemia.  Underwent  temporary dialysis catheter placement for possible need of dialysis .  Nephrology closely following.  Kidney function has remained stable since last few days except for hyperkalemia.   Subjective: No complaints.     Assessment & Plan:   Principal Problem:   AKI (acute kidney injury), metabolic acidosis Urinary retention - started on dialysis during this admission - renal ultrasound unrevealing - s/p foley placement to day with clots- follow Cr  Active Problems: Hyperkalemia - 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 - cont Lokelma, sodium bicarb tabs, chlorthalidone, low K diet     HIV disease (HCC) - ID has transitioned Tenofovir to tivicay, doravirine, lamivudine    Uncontrolled type 2 diabetes mellitus with hyperglycemia (HCC) - cont SSI - Hemoglobin A1C    Component Value Date/Time   HGBA1C 13.9 (H) 08/29/2020 0650      Cellulitis   Ulcer of heel  - s/p left BKA on 7/22    Time spent in minutes: 35 DVT prophylaxis: heparin injection 5,000 Units Start: 10/30/20 1430 SCD's Start: 10/27/20 1723 Place and maintain sequential compression device Start: 10/27/20 0559 SCDs Start: 10/18/20 0405  Code Status: full code Family  Communication:  Level of Care: Level of care: Telemetry Surgical Disposition Plan:  Status is: Inpatient  Remains inpatient appropriate because:IV treatments appropriate due to intensity of illness or inability to take PO  Dispo:  Patient From:    Planned Disposition: [place]  Medically stable for discharge:          Consultants:  Nephrology ID Procedures:  Dialysis cath BKA Antimicrobials:  Anti-infectives (From admission, onward)    Start     Dose/Rate Route Frequency Ordered Stop   11/08/20 1000  dolutegravir (TIVICAY) tablet 50 mg        50 mg Oral Daily 11/07/20 1451     11/08/20 1000  lamiVUDine (EPIVIR) tablet 300 mg        300 mg Oral Daily 11/07/20 1451     11/08/20 1000  doravirine (PIFELTRO) tablet 100 mg        100 mg Oral Daily 11/07/20 1451     11/02/20 1130  ceFAZolin (ANCEF) IVPB 2g/100 mL premix        2 g 200 mL/hr over 30 Minutes Intravenous To Radiology 11/02/20 1042 11/02/20 1704   10/27/20 1245  ceFAZolin (ANCEF) IVPB 3g/100 mL premix        3 g 200 mL/hr over 30 Minutes Intravenous On call to O.R. 10/27/20 0755 10/27/20 1608   10/20/20 1415  linezolid (ZYVOX) tablet 600 mg  Status:  Discontinued        600 mg Oral Every 12 hours 10/20/20 1326 10/28/20 1115   10/19/20 1445  metroNIDAZOLE (FLAGYL) tablet 500 mg  Status:  Discontinued  500 mg Oral Every 12 hours 10/19/20 1347 10/28/20 1115   10/18/20 1600  ceFEPIme (MAXIPIME) 2 g in sodium chloride 0.9 % 100 mL IVPB  Status:  Discontinued        2 g 200 mL/hr over 30 Minutes Intravenous Every 12 hours 10/18/20 0414 10/28/20 1112   10/18/20 1000  dapsone tablet 100 mg        100 mg Oral Daily 10/18/20 0425     10/18/20 1000  bictegravir-emtricitabine-tenofovir AF (BIKTARVY) 50-200-25 MG per tablet 1 tablet  Status:  Discontinued        1 tablet Oral Daily 10/18/20 0610 11/07/20 1451   10/18/20 0415  metroNIDAZOLE (FLAGYL) IVPB 500 mg  Status:  Discontinued        500 mg 100  mL/hr over 60 Minutes Intravenous Every 8 hours 10/18/20 0404 10/19/20 1347   10/18/20 0415  ceFEPIme (MAXIPIME) 2 g in sodium chloride 0.9 % 100 mL IVPB        2 g 200 mL/hr over 30 Minutes Intravenous  Once 10/18/20 0414 10/18/20 0500   10/18/20 0345  vancomycin (VANCOREADY) IVPB 2000 mg/400 mL        2,000 mg 200 mL/hr over 120 Minutes Intravenous  Once 10/18/20 0335 10/18/20 0608        Objective: Vitals:   11/07/20 2054 11/08/20 0449 11/08/20 0816 11/08/20 1653  BP: (!) 147/83 114/73 122/76 (!) 189/150  Pulse: 88 78 85 (!) 107  Resp: '18 19 15 18  '$ Temp: 98.6 F (37 C) 98.3 F (36.8 C) 99.1 F (37.3 C) 98.3 F (36.8 C)  TempSrc: Oral  Oral   SpO2: 98% 96% 100% 96%  Weight:      Height:        Intake/Output Summary (Last 24 hours) at 11/08/2020 1804 Last data filed at 11/08/2020 0800 Gross per 24 hour  Intake 1543.41 ml  Output 0 ml  Net 1543.41 ml   Filed Weights   10/22/20 0525 10/26/20 2221 11/06/20 2046  Weight: 124.8 kg 124.8 kg 124.8 kg    Examination: General exam: Appears comfortable  HEENT: PERRLA, oral mucosa moist, no sclera icterus or thrush Respiratory system: Clear to auscultation. Respiratory effort normal. Cardiovascular system: S1 & S2 heard, RRR.   Gastrointestinal system: Abdomen soft, non-tender, nondistended. Normal bowel sounds. Central nervous system: Alert and oriented. No focal neurological deficits. Extremities: No cyanosis, clubbing or edema- left BKA Skin: No rashes or ulcers Psychiatry:  Mood & affect appropriate.     Data Reviewed: I have personally reviewed following labs and imaging studies  CBC: Recent Labs  Lab 11/02/20 0345 11/03/20 0425 11/05/20 0206  WBC 9.1 8.9 8.5  NEUTROABS  --  6.4 5.5  HGB 7.5* 8.0* 7.6*  HCT 24.0* 25.3* 25.0*  MCV 84.8 84.3 85.3  PLT 374 364 [id number]   Basic Metabolic Panel: Recent Labs  Lab 11/05/20 0206 11/06/20 0154 11/06/20 1446 11/07/20 0527 11/07/20 1237 11/08/20 0315  NA 133* 135  136 134* 134* 135  K 5.8* 6.1* 5.7* 7.1* 6.1* 5.0  CL 101 105 105 105 106 98  CO2 22 20* 22 20* 19* 23  GLUCOSE 161* 159* 178* 192* 185* 208*  BUN 79* 74* 69* 78* 79* 79*  CREATININE 3.60* 3.39* 3.40* 3.80* 4.13* 4.12*  CALCIUM 9.2 9.2 9.5 9.4 9.7 9.6  PHOS 5.1* 4.8*  --  4.7* 4.8* 4.9*   GFR: Estimated Creatinine Clearance: 26.8 mL/min (A) (by C-G formula based on SCr of 4.12 mg/dL (H)).  Liver Function Tests: Recent Labs  Lab 11/05/20 0206 11/06/20 0154 11/07/20 0527 11/07/20 1237 11/08/20 0315  ALBUMIN 2.5* 2.6* 2.8* 2.9* 2.6*   No results for input(s): LIPASE, AMYLASE in the last 168 hours. No results for input(s): AMMONIA in the last 168 hours. Coagulation Profile: No results for input(s): INR, PROTIME in the last 168 hours. Cardiac Enzymes: No results for input(s): CKTOTAL, CKMB, CKMBINDEX, TROPONINI in the last 168 hours. BNP (last 3 results) No results for input(s): PROBNP in the last 8760 hours. HbA1C: No results for input(s): HGBA1C in the last 72 hours. CBG: Recent Labs  Lab 11/07/20 1627 11/07/20 2056 11/08/20 0647 11/08/20 1138 11/08/20 1648  GLUCAP 171* 178* 223* 236* 189*   Lipid Profile: No results for input(s): CHOL, HDL, LDLCALC, TRIG, CHOLHDL, LDLDIRECT in the last 72 hours. Thyroid Function Tests: No results for input(s): TSH, T4TOTAL, FREET4, T3FREE, THYROIDAB in the last 72 hours. Anemia Panel: No results for input(s): VITAMINB12, FOLATE, FERRITIN, TIBC, IRON, RETICCTPCT in the last 72 hours. Urine analysis:    Component Value Date/Time   COLORURINE STRAW (A) 10/07/2020 1405   APPEARANCEUR CLEAR 10/07/2020 1405   LABSPEC 1.016 10/07/2020 1405   PHURINE 7.0 10/07/2020 1405   GLUCOSEU >=500 (A) 10/07/2020 1405   HGBUR NEGATIVE 10/07/2020 1405   BILIRUBINUR NEGATIVE 10/07/2020 1405   BILIRUBINUR negative 09/07/2018 1034   KETONESUR NEGATIVE 10/07/2020 1405   PROTEINUR 100 (A) 10/07/2020 1405   UROBILINOGEN 1.0 09/07/2018 1034    UROBILINOGEN 1.0 01/01/2017 1032   NITRITE NEGATIVE 10/07/2020 1405   LEUKOCYTESUR NEGATIVE 10/07/2020 1405   Sepsis Labs: '@LABRCNTIP'$ (procalcitonin:4,lacticidven:4) )No results found for this or any previous visit (from the past 240 hour(s)).       Radiology Studies: No results found.    Scheduled Meds:  allopurinol  100 mg Oral Daily   vitamin C  1,000 mg Oral Daily   aspirin EC  81 mg Oral Daily   atorvastatin  80 mg Oral Daily   Chlorhexidine Gluconate Cloth  6 each Topical Q0600   chlorthalidone  12.5 mg Oral Daily   dapsone  100 mg Oral Daily   dolutegravir  50 mg Oral Daily   doravirine  100 mg Oral Daily   escitalopram  20 mg Oral Daily   gabapentin  100 mg Oral TID   heparin injection (subcutaneous)  5,000 Units Subcutaneous Q8H   icosapent Ethyl  2 g Oral BID   insulin aspart  0-15 Units Subcutaneous TID WC   insulin aspart  0-5 Units Subcutaneous QHS   lamiVUDine  300 mg Oral Daily   nutrition supplement (JUVEN)  1 packet Oral BID BM   oxybutynin  2.5 mg Oral BID   pantoprazole  40 mg Oral Daily   sodium bicarbonate  1,300 mg Oral BID   sodium zirconium cyclosilicate  10 g Oral Daily   ticagrelor  90 mg Oral BID   zinc sulfate  220 mg Oral Daily   Continuous Infusions:   LOS: 21 days      Debbe Odea, MD [facility] Pager: [email or web address] 11/08/2020, 6:04 PM

## 2020-11-09 ENCOUNTER — Inpatient Hospital Stay (HOSPITAL_COMMUNITY): Payer: Medicare HMO | Admitting: Anesthesiology

## 2020-11-09 ENCOUNTER — Encounter (HOSPITAL_COMMUNITY)
Admission: EM | Disposition: A | Discharge status: Home Health | Payer: Self-pay | Source: Home / Self Care | Attending: Internal Medicine

## 2020-11-09 DIAGNOSIS — Z89512 Acquired absence of left leg below knee: Secondary | ICD-10-CM | POA: Diagnosis not present

## 2020-11-09 DIAGNOSIS — N179 Acute kidney failure, unspecified: Secondary | ICD-10-CM

## 2020-11-09 DIAGNOSIS — N289 Disorder of kidney and ureter, unspecified: Secondary | ICD-10-CM | POA: Diagnosis not present

## 2020-11-09 DIAGNOSIS — B20 Human immunodeficiency virus [HIV] disease: Secondary | ICD-10-CM | POA: Diagnosis not present

## 2020-11-09 HISTORY — PX: CYSTOSCOPY: SHX5120

## 2020-11-09 LAB — CBC
HCT: 21.6 % — ABNORMAL LOW (ref 39.0–52.0)
Hemoglobin: 6.6 g/dL — CL (ref 13.0–17.0)
MCH: 26.9 pg (ref 26.0–34.0)
MCHC: 30.6 g/dL (ref 30.0–36.0)
MCV: 88.2 fL (ref 80.0–100.0)
Platelets: 224 10*3/uL (ref 150–400)
RBC: 2.45 MIL/uL — ABNORMAL LOW (ref 4.22–5.81)
RDW: 14.8 % (ref 11.5–15.5)
WBC: 11.5 10*3/uL — ABNORMAL HIGH (ref 4.0–10.5)
nRBC: 0.8 % — ABNORMAL HIGH (ref 0.0–0.2)

## 2020-11-09 LAB — RENAL FUNCTION PANEL
Albumin: 2.5 g/dL — ABNORMAL LOW (ref 3.5–5.0)
Anion gap: 13 (ref 5–15)
BUN: 81 mg/dL — ABNORMAL HIGH (ref 6–20)
CO2: 27 mmol/L (ref 22–32)
Calcium: 9.4 mg/dL (ref 8.9–10.3)
Chloride: 95 mmol/L — ABNORMAL LOW (ref 98–111)
Creatinine, Ser: 4.33 mg/dL — ABNORMAL HIGH (ref 0.61–1.24)
GFR, Estimated: 15 mL/min — ABNORMAL LOW (ref >60)
Glucose, Bld: 161 mg/dL — ABNORMAL HIGH (ref 70–99)
Phosphorus: 5.6 mg/dL — ABNORMAL HIGH (ref 2.5–4.6)
Potassium: 4.4 mmol/L (ref 3.5–5.1)
Sodium: 135 mmol/L (ref 135–145)

## 2020-11-09 LAB — GLUCOSE, CAPILLARY
Glucose-Capillary: 166 mg/dL — ABNORMAL HIGH (ref 70–99)
Glucose-Capillary: 162 mg/dL — ABNORMAL HIGH (ref 70–99)
Glucose-Capillary: 124 mg/dL — ABNORMAL HIGH (ref 70–99)
Glucose-Capillary: 140 mg/dL — ABNORMAL HIGH (ref 70–99)

## 2020-11-09 LAB — PREPARE RBC (CROSSMATCH)

## 2020-11-09 SURGERY — CYSTOSCOPY
Anesthesia: General | Site: Bladder

## 2020-11-09 MED ORDER — TRANEXAMIC ACID-NACL 1000-0.7 MG/100ML-% IV SOLN
INTRAVENOUS | Status: AC
  Filled 2020-11-09: qty 100

## 2020-11-09 MED ORDER — SODIUM CHLORIDE 0.9 % IR SOLN
Status: DC | PRN
  Administered 2020-11-09 (×3): 6000 mL

## 2020-11-09 MED ORDER — PHENYLEPHRINE HCL (PRESSORS) 10 MG/ML IV SOLN
INTRAVENOUS | Status: DC | PRN
  Administered 2020-11-09: 80 ug via INTRAVENOUS
  Administered 2020-11-09: 40 ug via INTRAVENOUS

## 2020-11-09 MED ORDER — PROPOFOL 10 MG/ML IV BOLUS
INTRAVENOUS | Status: DC | PRN
  Administered 2020-11-09: 150 mg via INTRAVENOUS

## 2020-11-09 MED ORDER — FENTANYL CITRATE (PF) 100 MCG/2ML IJ SOLN
INTRAMUSCULAR | Status: DC | PRN
  Administered 2020-11-09: 50 ug via INTRAVENOUS

## 2020-11-09 MED ORDER — MIDAZOLAM HCL 5 MG/5ML IJ SOLN
INTRAMUSCULAR | Status: DC | PRN
Start: 2020-11-09 — End: 2020-11-09
  Administered 2020-11-09: 2 mg via INTRAVENOUS

## 2020-11-09 MED ORDER — PHENYLEPHRINE HCL-NACL 20-0.9 MG/250ML-% IV SOLN
INTRAVENOUS | Status: DC | PRN
  Administered 2020-11-09: 40 ug/min via INTRAVENOUS

## 2020-11-09 MED ORDER — FENTANYL CITRATE (PF) 250 MCG/5ML IJ SOLN
INTRAMUSCULAR | Status: AC
  Filled 2020-11-09: qty 5

## 2020-11-09 MED ORDER — SODIUM CHLORIDE 0.9 % IV SOLN: INTRAVENOUS | Status: DC

## 2020-11-09 MED ORDER — PROPOFOL 10 MG/ML IV BOLUS
INTRAVENOUS | Status: AC
  Filled 2020-11-09: qty 20

## 2020-11-09 MED ORDER — 0.9 % SODIUM CHLORIDE (POUR BTL) OPTIME
TOPICAL | Status: DC | PRN
  Administered 2020-11-09: 1000 mL

## 2020-11-09 MED ORDER — SODIUM CHLORIDE 0.9% IV SOLUTION: Freq: Once | INTRAVENOUS | Status: AC

## 2020-11-09 MED ORDER — LIDOCAINE HCL (CARDIAC) PF 100 MG/5ML IV SOSY
PREFILLED_SYRINGE | INTRAVENOUS | Status: DC | PRN
  Administered 2020-11-09: 107 mg via INTRAVENOUS

## 2020-11-09 MED ORDER — ONDANSETRON HCL 4 MG/2ML IJ SOLN: 4.0000 mg | Freq: Once | INTRAMUSCULAR | Status: DC | PRN

## 2020-11-09 MED ORDER — FINASTERIDE 5 MG PO TABS
5.0000 mg | ORAL_TABLET | Freq: Every day | ORAL | Status: DC
  Administered 2020-11-10 – 2020-12-27 (×48): 5 mg via ORAL
  Filled 2020-11-09 (×50): qty 1

## 2020-11-09 MED ORDER — CEFAZOLIN SODIUM-DEXTROSE 2-3 GM-%(50ML) IV SOLR
INTRAVENOUS | Status: DC | PRN
  Administered 2020-11-09: 2 g via INTRAVENOUS

## 2020-11-09 MED ORDER — MIDAZOLAM HCL 2 MG/2ML IJ SOLN
INTRAMUSCULAR | Status: AC
  Filled 2020-11-09: qty 2

## 2020-11-09 MED ORDER — IOHEXOL 300 MG/ML  SOLN
INTRAMUSCULAR | Status: DC | PRN
  Administered 2020-11-09: 50 mL via URETHRAL

## 2020-11-09 MED ORDER — FENTANYL CITRATE (PF) 100 MCG/2ML IJ SOLN: 25.0000 ug | INTRAMUSCULAR | Status: DC | PRN

## 2020-11-09 MED ORDER — STERILE WATER FOR IRRIGATION IR SOLN
Status: DC | PRN
  Administered 2020-11-09: 500 mL

## 2020-11-09 MED ORDER — VANCOMYCIN HCL 500 MG/100ML IV SOLN
500.0000 mg | INTRAVENOUS | Status: DC
  Filled 2020-11-09: qty 100

## 2020-11-09 SURGICAL SUPPLY — 41 items
BAG COUNTER SPONGE SURGICOUNT (BAG) ×2 IMPLANT
BAG SPNG CNTER NS LX DISP (BAG)
BAG SURGICOUNT SPONGE COUNTING (BAG)
BAG URINE DRAINAGE (UROLOGICAL SUPPLIES) ×3 IMPLANT
BAG URO CATCHER STRL LF (MISCELLANEOUS) ×4 IMPLANT
CATH FOLEY 2WAY SLVR  5CC 20FR (CATHETERS)
CATH FOLEY 2WAY SLVR  5CC 22FR (CATHETERS)
CATH FOLEY 2WAY SLVR 30CC 22FR (CATHETERS) ×1 IMPLANT
CATH FOLEY 2WAY SLVR 5CC 20FR (CATHETERS) ×1 IMPLANT
CATH FOLEY 2WAY SLVR 5CC 22FR (CATHETERS) ×1 IMPLANT
CATH FOLEY 3WAY 30CC 22F (CATHETERS) ×1 IMPLANT
CATH HEMA 3WAY 30CC 22FR COUDE (CATHETERS) ×1 IMPLANT
CATH HEMA 3WAY 30CC 24FR COUDE (CATHETERS) ×3 IMPLANT
CATH INTERMIT  6FR 70CM (CATHETERS) ×2 IMPLANT
COVER SURGICAL LIGHT HANDLE (MISCELLANEOUS) ×3 IMPLANT
ELECT REM PT RETURN 9FT ADLT (ELECTROSURGICAL)
ELECTRODE REM PT RTRN 9FT ADLT (ELECTROSURGICAL) ×1 IMPLANT
EVACUATOR MICROVAS BLADDER (UROLOGICAL SUPPLIES) ×3 IMPLANT
GLOVE SURG ENC MOIS LTX SZ8 (GLOVE) ×3 IMPLANT
GLOVE SURG ENC TEXT LTX SZ7.5 (GLOVE) ×3 IMPLANT
GOWN STRL REUS W/ TWL XL LVL3 (GOWN DISPOSABLE) ×1 IMPLANT
GOWN STRL REUS W/TWL XL LVL3 (GOWN DISPOSABLE) ×3
GUIDEWIRE STR DUAL SENSOR (WIRE) ×2 IMPLANT
HOLDER FOLEY CATH W/STRAP (MISCELLANEOUS) ×3 IMPLANT
IV NS IRRIG 3000ML ARTHROMATIC (IV SOLUTION) ×12 IMPLANT
KIT TURNOVER KIT B (KITS) ×4 IMPLANT
LOOP CUT BIPOLAR 24F LRG (ELECTROSURGICAL) ×3 IMPLANT
MANIFOLD NEPTUNE II (INSTRUMENTS) ×3 IMPLANT
NS IRRIG 1000ML POUR BTL (IV SOLUTION) ×1 IMPLANT
PACK CYSTO (CUSTOM PROCEDURE TRAY) ×3 IMPLANT
PAD ARMBOARD 7.5X6 YLW CONV (MISCELLANEOUS) ×6 IMPLANT
PLUG CATH AND CAP STER (CATHETERS) ×3 IMPLANT
SET IRRIG Y TYPE TUR BLADDER L (SET/KITS/TRAYS/PACK) ×3 IMPLANT
SOL PREP POV-IOD 4OZ 10% (MISCELLANEOUS) ×3 IMPLANT
SYR 30ML LL (SYRINGE) ×3 IMPLANT
SYRINGE IRR TOOMEY STRL 70CC (SYRINGE) ×3 IMPLANT
TUBE CONNECTING 12'X1/4 (SUCTIONS) ×1
TUBE CONNECTING 12X1/4 (SUCTIONS) ×2 IMPLANT
TUBING ASPIRATION INDIGO (MISCELLANEOUS) ×3 IMPLANT
WATER STERILE IRR 1000ML POUR (IV SOLUTION) ×3 IMPLANT
WATER STERILE IRR 3000ML UROMA (IV SOLUTION) IMPLANT

## 2020-11-09 NOTE — Consult Note (Addendum)
Urology Consult   Physician requesting consult: Debbe Odea  Reason for consult: Gross hematuria  History of Present Illness: Keith [name] is a 55 y.o. male with PMH signficant for HIV, CKD III, DM II, HTN, CAD, and right BKA who was admitted via the ED for eval of left heel ulcer.  He subsequently underwent a left BKA on 10/27/20. Post operatively he has had a worsening of his kidney function and had a temporary dialysis cath placed for possible dialysis. Nephrology is following.  He had foley cath placed 11/08/20 for retention per pt report.  He does not know when hematuria began and denies hx of [place] and retention.  RUS was WNL, Hg 6.6, Cr 4.33.  No recent UA.  He is currently resting comfortably and denies F/C,CP, SOB, N/V, and abdominal pain.   Past Medical History:  Diagnosis Date   Abscess of right foot    abscess/ulcer of R transtibial amputation requiring IV abx   AIDS ([place])    Anemia    CAD (coronary artery disease)    a. MI with stenting of OM1 in 11/2018 with residual disease. b. acute STEMI 03/2020 s/p DES to OM1   Chronic anemia    Chronic knee pain    right   Chronic pain    CKD (chronic kidney disease), stage IV (HCC)    CVA (cerebral vascular accident) ([place]) 04/2020   Diabetes type 2, uncontrolled ([place])    HgA1c 17.6 (04/27/2010)   Diabetic foot ulcer ([place]) 01/2017   right foot   Dilatation of aorta (HCC)    Erectile dysfunction    Genital warts    HIV (human immunodeficiency virus infection) ([name]) 2009   CD4 count 100, VL 13800 (05/01/2010)   Hyperlipidemia    Hypertension    Noncompliance with medication regimen    Osteomyelitis ([place])    h/o hand   Osteomyelitis of metatarsal ([employer]) 04/28/2017    Past Surgical History:  Procedure Laterality Date   AMPUTATION Right 05/02/2017   Procedure: AMPUTATION TRANSMETARSAL;  Surgeon: [name], MD;  Location: [facility];  Service: Orthopedics;  Laterality: Right;   AMPUTATION Right 06/13/2017   Procedure: RIGHT BELOW  KNEE AMPUTATION;  Surgeon: [name], MD;  Location: [place];  Service: Orthopedics;  Laterality: Right;   AMPUTATION Left 10/27/2020   Procedure: LEFT BELOW KNEE AMPUTATION;  Surgeon: [name], MD;  Location: [facility];  Service: Orthopedics;  Laterality: Left;   APPLICATION OF WOUND VAC Left 10/27/2020   Procedure: APPLICATION OF WOUND VAC;  Surgeon: [name], MD;  Location: [facility];  Service: Orthopedics;  Laterality: Left;   BELOW KNEE LEG AMPUTATION Right 06/13/2017   BELOW KNEE LEG AMPUTATION     CORONARY STENT INTERVENTION N/A 11/10/2018   Procedure: CORONARY STENT INTERVENTION;  Surgeon: Leonie Man, MD;  Location: [facility];  Service: Cardiovascular;  Laterality: N/A;   CORONARY/GRAFT ACUTE MI REVASCULARIZATION N/A 03/21/2020   Procedure: Coronary/Graft Acute MI Revascularization;  Surgeon: [name], MD;  Location: [facility];  Service: Cardiovascular;  Laterality: N/A;   HERNIA REPAIR     I & D EXTREMITY Left 08/21/2014   Procedure: INCISION AND DRAINAGE LEFT SMALL FINGER;  Surgeon: Leanora Cover, MD;  Location: [facility];  Service: Orthopedics;  Laterality: Left;   I & D EXTREMITY Right 03/18/2017   Procedure: IRRIGATION AND DEBRIDEMENT EXTREMITY;  Surgeon: [name], MD;  Location: [facility];  Service: Orthopedics;  Laterality:  Right;   IR FLUORO GUIDE CV LINE RIGHT  11/02/2020   IR US GUIDE VASC ACCESS RIGHT  11/02/2020   LEFT HEART CATH AND CORONARY ANGIOGRAPHY N/A 11/10/2018   Procedure: LEFT HEART CATH AND CORONARY ANGIOGRAPHY;  Surgeon: Leonie Man, MD;  Location: [facility];  Service: Cardiovascular;  Laterality: N/A;   LEFT HEART CATH AND CORONARY ANGIOGRAPHY N/A 03/21/2020   Procedure: LEFT HEART CATH AND CORONARY ANGIOGRAPHY;  Surgeon: [name], MD;  Location: [facility];  Service: Cardiovascular;  Laterality: N/A;   MINOR IRRIGATION AND DEBRIDEMENT OF WOUND Right 04/22/2014   Procedure: IRRIGATION AND DEBRIDEMENT OF  RIGHT NECK ABCESS;  Surgeon: Jerrell Belfast, MD;  Location: [facility];  Service: ENT;  Laterality: Right;   MULTIPLE EXTRACTIONS WITH ALVEOLOPLASTY N/A 01/18/2013   Procedure: MULTIPLE EXTRACION 3, 6, 7, 10, 11, 13, 21, 22, 27, 28, 29, 30 WITH ALVEOLOPLASTY;  Surgeon: Gae Bon, DDS;  Location: [facility];  Service: Oral Surgery;  Laterality: N/A;   SKIN SPLIT GRAFT Right 03/21/2017   Procedure: IRRIGATION AND DEBRIDEMENT RIGHT FOOT AND APPLY SPLIT THICKNESS SKIN GRAFT AND WOUND VAC;  Surgeon: [name], MD;  Location: [facility];  Service: Orthopedics;  Laterality: Right;   TEE WITHOUT CARDIOVERSION N/A 05/02/2017   Procedure: TRANSESOPHAGEAL ECHOCARDIOGRAM (TEE);  Surgeon: Thayer Headings, MD;  Location: [place];  Service: Cardiovascular;  Laterality: N/A;  coincidental to orthopedic case    Current Hospital Medications:  Home Meds:  . Current Meds  Medication Sig   acetaminophen (TYLENOL) 325 MG tablet Take 2 tablets (650 mg total) by mouth every 4 (four) hours as needed for mild pain, fever or headache.   allopurinol (ZYLOPRIM) 300 MG tablet Take 300 mg by mouth daily.   amLODipine (NORVASC) 10 MG tablet Take 1 tablet (10 mg total) by mouth daily.   aspirin 81 MG EC tablet Take 1 tablet (81 mg total) by mouth daily.   atorvastatin (LIPITOR) 80 MG tablet Take 1 tablet (80 mg total) by mouth daily.   bictegravir-emtricitabine-tenofovir AF (BIKTARVY) 50-200-25 MG TABS tablet Take 1 tablet by mouth daily.   blood glucose meter kit and supplies Dispense based on patient and insurance preference. Use up to four times daily as directed. (FOR ICD-10 E10.9, E11.9).   carvedilol (COREG) 25 MG tablet Take 1 tablet (25 mg total) by mouth 2 (two) times daily with a meal.   dapsone 100 MG tablet TAKE 1 TABLET (100 MG TOTAL) BY MOUTH DAILY. (Patient taking differently: Take 100 mg by mouth daily.)   escitalopram (LEXAPRO) 20 MG tablet Take 20 mg by mouth daily.   gabapentin (NEURONTIN) 100 MG capsule Take 1  capsule (100 mg total) by mouth 3 (three) times daily.   glucose blood (COOL BLOOD GLUCOSE TEST STRIPS) test strip Use as instructed   icosapent Ethyl (VASCEPA) 1 g capsule TAKE 2 CAPSULES (2 G TOTAL) BY MOUTH TWO TIMES DAILY. (Patient taking differently: Take 2 g by mouth 2 (two) times daily.)   insulin aspart protamine- aspart (NOVOLOG MIX 70/30) (70-30) 100 UNIT/ML injection Inject 0.3 mLs (30 Units total) into the skin 2 (two) times daily with a meal. With breakfast and dinner. (Patient taking differently: No sig reported)   lisinopril (ZESTRIL) 5 MG tablet Take 5 mg by mouth daily.   metFORMIN (GLUCOPHAGE) 1000 MG tablet Take 1,000 mg by mouth 2 (two) times daily.   nitroGLYCERIN (NITROSTAT) 0.4 MG SL tablet PLACE 1 TABLET (0.4 MG  TOTAL) UNDER THE TONGUE EVERY FIVE MINUTES X 3 DOSES AS NEEDED FOR CHEST PAIN. (Patient taking differently: Place 0.4 mg under the tongue every 5 (five) minutes x 3 doses as needed for chest pain.)   oxybutynin (DITROPAN) 5 MG tablet TAKE 1 TABLET (5 MG TOTAL) BY MOUTH TWO TIMES DAILY. (Patient taking differently: Take 5 mg by mouth 2 (two) times daily.)   ticagrelor (BRILINTA) 90 MG TABS tablet Take 90 mg by mouth 2 (two) times daily.   traZODone (DESYREL) 100 MG tablet Take 100 mg by mouth at bedtime.   [DISCONTINUED] ALPRAZolam (XANAX) 1 MG tablet Take 1 mg by mouth daily as needed for anxiety.   [DISCONTINUED] oxyCODONE 10 MG TABS Take 1 tablet (10 mg total) by mouth every 6 (six) hours as needed for severe pain (Severe pain.).     Scheduled Meds:  allopurinol  100 mg Oral Daily   vitamin C  1,000 mg Oral Daily   aspirin EC  81 mg Oral Daily   atorvastatin  80 mg Oral Daily   Chlorhexidine Gluconate Cloth  6 each Topical Q0600   dapsone  100 mg Oral Daily   dolutegravir  50 mg Oral Daily   doravirine  100 mg Oral Daily   escitalopram  20 mg Oral Daily   gabapentin  100 mg Oral TID   icosapent Ethyl  2 g Oral BID   insulin aspart  0-15 Units Subcutaneous  TID WC   insulin aspart  0-5 Units Subcutaneous QHS   lamiVUDine  300 mg Oral Daily   nutrition supplement (JUVEN)  1 packet Oral BID BM   oxybutynin  2.5 mg Oral BID   pantoprazole  40 mg Oral Daily   ticagrelor  90 mg Oral BID   Continuous Infusions:  sodium chloride 100 mL/hr at 11/09/20 1009   [START ON 11/11/2020] vancomycin     PRN Meds:.alum & mag hydroxide-simeth, diphenoxylate-atropine, guaiFENesin-dextromethorphan, hydrALAZINE, HYDROmorphone (DILAUDID) injection, labetalol, phenol, polyethylene glycol  Allergies:  Allergies  Allergen Reactions   Elemental Sulfur Itching    Patient stated he's allergic to "sulfur" AND "sulfa"   Sulfa Antibiotics Itching    Family History  Problem Relation Age of Onset   Hypertension Mother    Arthritis Father    Hypertension Father    Hypertension Brother    Cancer Maternal Grandmother 36       unknown type of cancer   Depression Paternal Grandmother     Social History:  reports that he has never smoked. He has never used smokeless tobacco. He reports previous alcohol use. He reports previous drug use.  ROS: A complete review of systems was performed.  All systems are negative except for pertinent findings as noted.  Physical Exam:  Vital signs in last 24 hours: Temp:  [98.2 F (36.8 C)-100.2 F (37.9 C)] 98.2 F (36.8 C) (08/04 1723) Pulse Rate:  [76-88] 82 (08/04 1723) Resp:  [18-20] 20 (08/04 1723) BP: (111-147)/(66-91) 136/89 (08/04 1723) SpO2:  [91 %-96 %] 96 % (08/04 1723) Constitutional:  Alert and oriented, No acute distress Cardiovascular: Regular rate and rhythm Respiratory: Normal respiratory effort GI: Abdomen is soft, nontender, nondistended, no abdominal masses GU: uncircum penis with 53f foley in place; dark red urine in bag Lymphatic: No lymphadenopathy Neurologic: Grossly intact, no focal deficits Psychiatric: Normal mood and affect  Laboratory Data:  Recent Labs    11/09/20 0856  WBC 11.5*  HGB  6.6*  HCT 21.6*  PLT 224  Recent Labs    11/07/20 0527 11/07/20 1237 11/08/20 0315 11/09/20 0355  NA 134* 134* 135 135  K 7.1* 6.1* 5.0 4.4  CL 105 106 98 95*  GLUCOSE 192* 185* 208* 161*  BUN 78* 79* 79* 81*  CALCIUM 9.4 9.7 9.6 9.4  CREATININE 3.80* 4.13* 4.12* 4.33*     Results for orders placed or performed during the hospital encounter of 10/17/20 (from the past 24 hour(s))  Glucose, capillary     Status: Abnormal   Collection Time: 11/08/20  8:32 PM  Result Value Ref Range   Glucose-Capillary 151 (H) 70 - 99 mg/dL  Renal function panel     Status: Abnormal   Collection Time: 11/09/20  3:55 AM  Result Value Ref Range   Sodium 135 135 - 145 mmol/L   Potassium 4.4 3.5 - 5.1 mmol/L   Chloride 95 (L) 98 - 111 mmol/L   CO2 27 22 - 32 mmol/L   Glucose, Bld 161 (H) 70 - 99 mg/dL   BUN 81 (H) 6 - 20 mg/dL   Creatinine, Ser 9.44 (H) 0.61 - 1.24 mg/dL   Calcium 9.4 8.9 - 96.7 mg/dL   Phosphorus 5.6 (H) 2.5 - 4.6 mg/dL   Albumin 2.5 (L) 3.5 - 5.0 g/dL   GFR, Estimated 15 (L) >60 mL/min   Anion gap 13 5 - 15  Glucose, capillary     Status: Abnormal   Collection Time: 11/09/20  6:28 AM  Result Value Ref Range   Glucose-Capillary 166 (H) 70 - 99 mg/dL   Comment 1 Notify RN    Comment 2 Document in Chart   CBC     Status: Abnormal   Collection Time: 11/09/20  8:56 AM  Result Value Ref Range   WBC 11.5 (H) 4.0 - 10.5 K/uL   RBC 2.45 (L) 4.22 - 5.81 MIL/uL   Hemoglobin 6.6 (LL) 13.0 - 17.0 g/dL   HCT 59.1 (L) 63.8 - 46.6 %   MCV 88.2 80.0 - 100.0 fL   MCH 26.9 26.0 - 34.0 pg   MCHC 30.6 30.0 - 36.0 g/dL   RDW 59.9 35.7 - 01.7 %   Platelets 224 150 - 400 K/uL   nRBC 0.8 (H) 0.0 - 0.2 %  Type and screen [facility]     Status: None (Preliminary result)   Collection Time: 11/09/20 11:03 AM  Result Value Ref Range   ABO/RH(D) O POS    Antibody Screen NEG    Sample Expiration 11/12/2020,2359    Unit Number [id number]    Blood Component Type RED  CELLS,LR    Unit division 00    Status of Unit ALLOCATED    Transfusion Status OK TO TRANSFUSE    Crossmatch Result Compatible    Unit Number [id number]    Blood Component Type RED CELLS,LR    Unit division 00    Status of Unit ISSUED    Transfusion Status OK TO TRANSFUSE    Crossmatch Result      Compatible Performed at [facility], [address], [place], [place] [address]   Prepare RBC (crossmatch)     Status: None   Collection Time: 11/09/20 11:03 AM  Result Value Ref Range   Order Confirmation      ORDER PROCESSED BY BLOOD BANK Performed at [facility], [address], [place], [place] [address]   Glucose, capillary     Status: Abnormal   Collection Time: 11/09/20 11:11 AM  Result Value Ref Range   Glucose-Capillary 162 (H) 70 - 99 mg/dL  Glucose, capillary     Status: Abnormal   Collection Time: 11/09/20  4:34 PM  Result Value Ref Range   Glucose-Capillary 124 (H) 70 - 99 mg/dL   No results found for this or any previous visit (from the past 240 hour(s)).  Renal Function: Recent Labs    11/05/20 0206 11/06/20 0154 11/06/20 1446 11/07/20 0527 11/07/20 1237 11/08/20 0315 11/09/20 0355  CREATININE 3.60* 3.39* 3.40* 3.80* 4.13* 4.12* 4.33*   Estimated Creatinine Clearance: 25.5 mL/min (A) (by C-G formula based on SCr of 4.33 mg/dL (H)).  Radiologic Imaging: No results found.  Procedure: attempt to irrgate 48f was unsuccessful. 3f foley removed.  Area prepped and draped in sterile fashion.  Attempt to place 20 coude 3 way unsuccessful due to meatal and proximal stenosis.  18 f placed successfully and balloon inflated with 20cc sterile saline.  Hand irrigated with sterile saline with little return.    Impression/Recommendation Gross hematuria--source unclear.  Pt has meatal and proximal stenosis therefore additional attempts to place a larger foley were not performed.  Pt may need cysto for bladder/prostate eval as well as urethral dilation. He  may also require CT A/P without contrast. Dr. Abner Greenspan will eval pt later today and make additional recommendations.   Debbrah Alar 11/09/2020, 6:54 PM    I have seen and examined the patient and agree with the above assessment and plan.  Gross hematuria: I placed a 22 Fr 3 way catheter with limited irrigation. I was able to evacuate some clots, but he has persistent hematuria with limited irrigation. Will post for cysto, clot evacuation, fulguration.  Matt R[facility]  Pager: [phone]

## 2020-11-09 NOTE — Progress Notes (Addendum)
[name] for Infectious Disease    Date of Admission:  10/17/2020      ID: Keith [name] is a 55 y.o. male with HIV disease Principal Problem:   AKI (acute kidney injury) ([place]) Active Problems:   Elevated blood pressure reading with diagnosis of hypertension   HIV disease ([place])   Uncontrolled type 2 diabetes mellitus with hyperglycemia ([place])   Cellulitis   Ulcer of heel due to diabetes mellitus (HCC)   Normochromic normocytic anemia   Cellulitis of left foot   Acute osteomyelitis of left calcaneus (HCC)    Subjective: Tolerating new hiv regimen. Afebrile. Mild pain to left bka. Continues to produce urine, dark, with occasional clots.  Cr not improving yet.  Medications:   sodium chloride   Intravenous Once   allopurinol  100 mg Oral Daily   vitamin C  1,000 mg Oral Daily   aspirin EC  81 mg Oral Daily   atorvastatin  80 mg Oral Daily   Chlorhexidine Gluconate Cloth  6 each Topical Q0600   dapsone  100 mg Oral Daily   dolutegravir  50 mg Oral Daily   doravirine  100 mg Oral Daily   escitalopram  20 mg Oral Daily   gabapentin  100 mg Oral TID   icosapent Ethyl  2 g Oral BID   insulin aspart  0-15 Units Subcutaneous TID WC   insulin aspart  0-5 Units Subcutaneous QHS   lamiVUDine  300 mg Oral Daily   nutrition supplement (JUVEN)  1 packet Oral BID BM   oxybutynin  2.5 mg Oral BID   pantoprazole  40 mg Oral Daily   ticagrelor  90 mg Oral BID    Objective: Vital signs in last 24 hours: Temp:  [98.3 F (36.8 C)-100.2 F (37.9 C)] 99 F (37.2 C) (08/04 1540) Pulse Rate:  [76-107] 88 (08/04 1540) Resp:  [18-20] 18 (08/04 1540) BP: (111-189)/(66-150) 134/88 (08/04 1540) SpO2:  [91 %-96 %] 94 % (08/04 1540) Physical Exam  Constitutional: He is oriented to person, place, and time. He appears well-developed and well-nourished. No distress.  HENT:  Mouth/Throat: Oropharynx is clear and moist. No oropharyngeal exudate.  Cardiovascular: Normal rate, regular  rhythm and normal heart sounds. Exam reveals no gallop and no friction rub.  No murmur heard.  Chest wall = hd catheter in place Pulmonary/Chest: Effort normal and breath sounds normal. No respiratory distress. He has no wheezes.  Abdominal: Soft. Bowel sounds are normal. He exhibits no distension. There is no tenderness.  Lymphadenopathy:  He has no cervical adenopathy.  Ext: left BKA (most recent) wrapped, no blood on dressing. Neurological: He is alert and oriented to person, place, and time.  Skin: Skin is warm and dry. No rash noted. No erythema.  Psychiatric: He has a normal mood and affect. His behavior is normal.    Lab Results Recent Labs    11/08/20 0315 11/09/20 0355 11/09/20 0856  WBC  --   --  11.5*  HGB  --   --  6.6*  HCT  --   --  21.6*  NA 135 135  --   K 5.0 4.4  --   CL 98 95*  --   CO2 23 27  --   BUN 79* 81*  --   CREATININE 4.12* 4.33*  --    Liver Panel Recent Labs    11/08/20 0315 11/09/20 0355  ALBUMIN 2.6* 2.5*     Microbiology: reviewed Studies/Results: No  results found.   Assessment/Plan: Hiv disease= continue on tivicay, doravarine, lamivudine combination for now. If he does end up on HD, can revert back to biktarvy if want to decrease pill burden. Plan to repeat VL and CD 4 count in 3-4 wk  Acute on CKD 3b, = combination of ATN, RTA type 4- being managed by nephrology to help improve back to his baseline.  Recent left bka = please have orthopedics see patient prior to discharge since unclear if they have seen him since his surgery.  Gross hematuria = appreciate urology input/management.   Will sign off. Will plan to follow up in the ID clinic in 3-4 wk  [facility] Cell: [phone] Pager: [phone]  11/09/2020, 4:23 PM

## 2020-11-09 NOTE — Progress Notes (Signed)
Urologist in the room. Doing irrigation. Pt id due for 2nd blood. Hold off 2nd bag blood transfusion for now per urologist

## 2020-11-09 NOTE — Progress Notes (Signed)
PROGRESS NOTE    Keith [name]   [id number]  DOB: 1965-05-07  DOA: 10/17/2020 [name], NP   Brief Narrative:  Keith [name] a  55 year old male with past medical history of HIV, chronic kidney disease stage IIIb, type 2 diabetes mellitus, hypertension, hyperlipidemia, coronary artery disease, status post right below knee amputation who was sent to the emergency department by his orthopedic surgeon due to concern for left heel ulcer that has failed outpatient therapy.  MRI confirmed osteomyelitis.  He underwent left transtibial amputation by [name].  PT/OT recommended skilled nursing facility on discharge.  TOC consulted and following for disposition. Nephrology consulted  for worsening kidney function/hyperkalemia.  Underwent  temporary dialysis catheter placement for possible need of dialysis .  Nephrology closely following.  Kidney function has remained stable since last few days except for hyperkalemia.   Subjective: He has no complaints. Discussed his ongoing hematuria and urology consult.     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 - cont Lokelma, sodium bicarb tabs, chlorthalidone, low K diet     HIV disease (HCC) - ID has transitioned Tenofovir to tivicay, doravirine, lamivudine due to tubular dysfunction    Uncontrolled type 2 diabetes mellitus with hyperglycemia (HCC) - cont SSI - Hemoglobin A1C    Component Value Date/Time   HGBA1C 13.9 (H) 08/29/2020 0650      Cellulitis   Ulcer of heel  - s/p left BKA on 7/22    Time  spent in minutes: 35 DVT prophylaxis: SCD's Start: 10/27/20 1723 Place and maintain sequential compression device Start: 10/27/20 0559 SCDs Start: 10/18/20 0405  Code Status: full code Family Communication:  Level of Care: Level of care: Telemetry Surgical Disposition Plan:  Status is: Inpatient  Remains inpatient appropriate because:IV treatments appropriate due to intensity of illness or inability to take PO  Dispo:  Patient From:    Planned Disposition: [place]  Medically stable for discharge:          Consultants:  Nephrology ID Procedures:  Dialysis cath BKA Antimicrobials:  Anti-infectives (From admission, onward)    Start     Dose/Rate Route Frequency Ordered Stop   11/08/20 1000  dolutegravir (TIVICAY) tablet 50 mg        50 mg Oral Daily 11/07/20 1451     11/08/20 1000  lamiVUDine (EPIVIR) tablet 300 mg        300 mg Oral Daily 11/07/20 1451     11/08/20 1000  doravirine (PIFELTRO) tablet 100 mg        100 mg Oral Daily 11/07/20 1451     11/02/20 1130  ceFAZolin (ANCEF) IVPB 2g/100 mL premix        2 g 200 mL/hr over 30 Minutes Intravenous To Radiology 11/02/20 1042 11/02/20 1704   10/27/20 1245  ceFAZolin (ANCEF) IVPB 3g/100 mL premix        3 g 200 mL/hr over 30 Minutes Intravenous On call to O.R. 10/27/20 0755 10/27/20 1608   10/20/20 1415  linezolid (ZYVOX)  tablet 600 mg  Status:  Discontinued        600 mg Oral Every 12 hours 10/20/20 1326 10/28/20 1115   10/19/20 1445  metroNIDAZOLE (FLAGYL) tablet 500 mg  Status:  Discontinued        500 mg Oral Every 12 hours 10/19/20 1347 10/28/20 1115   10/18/20 1600  ceFEPIme (MAXIPIME) 2 g in sodium chloride 0.9 % 100 mL IVPB  Status:  Discontinued        2 g 200 mL/hr over 30 Minutes Intravenous Every 12 hours 10/18/20 0414 10/28/20 1112   10/18/20 1000  dapsone tablet 100 mg        100 mg Oral Daily 10/18/20 0425     10/18/20 1000  bictegravir-emtricitabine-tenofovir AF (BIKTARVY) 50-200-25  MG per tablet 1 tablet  Status:  Discontinued        1 tablet Oral Daily 10/18/20 0610 11/07/20 1451   10/18/20 0415  metroNIDAZOLE (FLAGYL) IVPB 500 mg  Status:  Discontinued        500 mg 100 mL/hr over 60 Minutes Intravenous Every 8 hours 10/18/20 0404 10/19/20 1347   10/18/20 0415  ceFEPIme (MAXIPIME) 2 g in sodium chloride 0.9 % 100 mL IVPB        2 g 200 mL/hr over 30 Minutes Intravenous  Once 10/18/20 0414 10/18/20 0500   10/18/20 0345  vancomycin (VANCOREADY) IVPB 2000 mg/400 mL        2,000 mg 200 mL/hr over 120 Minutes Intravenous  Once 10/18/20 0335 10/18/20 0608        Objective: Vitals:   11/08/20 1653 11/08/20 2031 11/09/20 0527 11/09/20 1110  BP: (!) 189/150 111/66 121/72 (!) 147/91  Pulse: (!) 107 76 81 85  Resp: '18 20 18 18  '$ Temp: 98.3 F (36.8 C) 100 F (37.8 C) 100.2 F (37.9 C)   TempSrc:  Oral Oral   SpO2: 96% 93% 91% 95%  Weight:      Height:        Intake/Output Summary (Last 24 hours) at 11/09/2020 1302 Last data filed at 11/09/2020 0958 Gross per 24 hour  Intake 520 ml  Output 601 ml  Net -81 ml    Filed Weights   10/22/20 0525 10/26/20 2221 11/06/20 2046  Weight: 124.8 kg 124.8 kg 124.8 kg    Examination: General exam: Appears comfortable  HEENT: PERRLA, oral mucosa moist, no sclera icterus or thrush Respiratory system: Clear to auscultation. Respiratory effort normal. Cardiovascular system: S1 & S2 heard, RRR.   Gastrointestinal system: Abdomen soft, non-tender, nondistended. Normal bowel sounds. Central nervous system: Alert and oriented. No focal neurological deficits. Extremities: No cyanosis, clubbing or edema- b/l BKA Skin: No rashes or ulcers Psychiatry:  Mood & affect appropriate.     Data Reviewed: I have personally reviewed following labs and imaging studies  CBC: Recent Labs  Lab 11/03/20 0425 11/05/20 0206 11/09/20 0856  WBC 8.9 8.5 11.5*  NEUTROABS 6.4 5.5  --   HGB 8.0* 7.6* 6.6*  HCT 25.3* 25.0* 21.6*  MCV 84.3  85.3 88.2  PLT 364 330 [id number]    Basic Metabolic Panel: Recent Labs  Lab 11/06/20 0154 11/06/20 1446 11/07/20 0527 11/07/20 1237 11/08/20 0315 11/09/20 0355  NA 135 136 134* 134* 135 135  K 6.1* 5.7* 7.1* 6.1* 5.0 4.4  CL 105 105 105 106 98 95*  CO2 20* 22 20* 19* 23 27  GLUCOSE 159* 178* 192* 185* 208* 161*  BUN 74* 69* 78* 79* 79* 81*  CREATININE 3.39* 3.40* 3.80* 4.13* 4.12* 4.33*  CALCIUM 9.2 9.5 9.4 9.7 9.6 9.4  PHOS 4.8*  --  4.7* 4.8* 4.9* 5.6*    GFR: Estimated Creatinine Clearance: 25.5 mL/min (A) (by C-G formula based on SCr of 4.33 mg/dL (H)). Liver Function Tests: Recent Labs  Lab 11/06/20 0154 11/07/20 0527 11/07/20 1237 11/08/20 0315 11/09/20 0355  ALBUMIN 2.6* 2.8* 2.9* 2.6* 2.5*    No results for input(s): LIPASE, AMYLASE in the last 168 hours. No results for input(s): AMMONIA in the last 168 hours. Coagulation Profile: No results for input(s): INR, PROTIME in the last 168 hours. Cardiac Enzymes: No results for input(s): CKTOTAL, CKMB, CKMBINDEX, TROPONINI in the last 168 hours. BNP (last 3 results) No results for input(s): PROBNP in the last 8760 hours. HbA1C: No results for input(s): HGBA1C in the last 72 hours. CBG: Recent Labs  Lab 11/08/20 1138 11/08/20 1648 11/08/20 2032 11/09/20 0628 11/09/20 1111  GLUCAP 236* 189* 151* 166* 162*    Lipid Profile: No results for input(s): CHOL, HDL, LDLCALC, TRIG, CHOLHDL, LDLDIRECT in the last 72 hours. Thyroid Function Tests: No results for input(s): TSH, T4TOTAL, FREET4, T3FREE, THYROIDAB in the last 72 hours. Anemia Panel: No results for input(s): VITAMINB12, FOLATE, FERRITIN, TIBC, IRON, RETICCTPCT in the last 72 hours. Urine analysis:    Component Value Date/Time   COLORURINE STRAW (A) 10/07/2020 1405   APPEARANCEUR CLEAR 10/07/2020 1405   LABSPEC 1.016 10/07/2020 1405   PHURINE 7.0 10/07/2020 1405   GLUCOSEU >=500 (A) 10/07/2020 1405   HGBUR NEGATIVE 10/07/2020 1405   BILIRUBINUR  NEGATIVE 10/07/2020 1405   BILIRUBINUR negative 09/07/2018 1034   KETONESUR NEGATIVE 10/07/2020 1405   PROTEINUR 100 (A) 10/07/2020 1405   UROBILINOGEN 1.0 09/07/2018 1034   UROBILINOGEN 1.0 01/01/2017 1032   NITRITE NEGATIVE 10/07/2020 1405   LEUKOCYTESUR NEGATIVE 10/07/2020 1405   Sepsis Labs: '@LABRCNTIP'$ (procalcitonin:4,lacticidven:4) )No results found for this or any previous visit (from the past 240 hour(s)).       Radiology Studies: No results found.    Scheduled Meds:  sodium chloride   Intravenous Once   allopurinol  100 mg Oral Daily   vitamin C  1,000 mg Oral Daily   aspirin EC  81 mg Oral Daily   atorvastatin  80 mg Oral Daily   Chlorhexidine Gluconate Cloth  6 each Topical Q0600   dapsone  100 mg Oral Daily   dolutegravir  50 mg Oral Daily   doravirine  100 mg Oral Daily   escitalopram  20 mg Oral Daily   gabapentin  100 mg Oral TID   icosapent Ethyl  2 g Oral BID   insulin aspart  0-15 Units Subcutaneous TID WC   insulin aspart  0-5 Units Subcutaneous QHS   lamiVUDine  300 mg Oral Daily   nutrition supplement (JUVEN)  1 packet Oral BID BM   oxybutynin  2.5 mg Oral BID   pantoprazole  40 mg Oral Daily   ticagrelor  90 mg Oral BID   Continuous Infusions:  sodium chloride 100 mL/hr at 11/09/20 1009     LOS: 22 days      Debbe Odea, MD [facility] Pager: [email or web address] 11/09/2020, 1:02 PM

## 2020-11-09 NOTE — Progress Notes (Signed)
13 Days Post-Op Subjective: Pain controlled.  No nausea or emesis.  Tolerating Foley catheter.  Urine is clear.  Objective: Vital signs in last 24 hours: Temp:  [98.2 F (36.8 C)-100.2 F (37.9 C)] 98.6 F (37 C) (08/04 2129) Pulse Rate:  [71-88] 71 (08/04 2129) Resp:  [18-20] 18 (08/04 2129) BP: (112-147)/(64-91) 112/64 (08/04 2129) SpO2:  [91 %-96 %] 95 % (08/04 1937)  Intake/Output from previous day: 08/03 0701 - 08/04 0700 In: 700 [P.O.:700] Out: 600 [Urine:600] Intake/Output this shift: Total I/O In: 582 [P.O.:120; Blood:462] Out: -   UOP: 28L  Physical Exam:  General: Alert and oriented CV: RRR Lungs: Clear Abdomen: Soft, ND, NT Ext: NT, No erythema  Lab Results: Recent Labs    11/09/20 0856  HGB 6.6*  HCT 21.6*   BMET Recent Labs    11/08/20 0315 11/09/20 0355  NA 135 135  K 5.0 4.4  CL 98 95*  CO2 23 27  GLUCOSE 208* 161*  BUN 79* 81*  CREATININE 4.12* 4.33*  CALCIUM 9.6 9.4     Studies/Results: No results found.  Assessment/Plan: Gross hematuria: S/p clot evacuation, fulguration of prostate on 11/09/2020 Acute blood loss anemia: Hemoglobin 6.6 on day of consultation from baseline of 8.  S/p 2u pRBC on 11/09/2020 AKI: Creatinine time of consultation was 4.33 from baseline of less than 1.  And treat for allergies following and is felt that he has ATN.  He has been initiated on dialysis this admission. PMH of HIV, CKD III, DM II, HTN, CAD, and right BKA  -Urine clear on medium drip. I turned this down to a slow rate. Titrate as needed and ok to clamp if remains clear. -Start finasteride '5mg'$  daily -Ideally would have CT hematuria protocol, however unable given AKI. Will order non-con CT A/P. -Leave foley in place for at least 3 days. -I messaged schedulers to arrange followup   LOS: 22 days   Matt R. Pearle Wandler MD 11/09/2020, 9:38 PM [facility]  Pager: [phone]

## 2020-11-09 NOTE — Anesthesia Procedure Notes (Signed)
Procedure Name: LMA Insertion Date/Time: 11/09/2020 10:32 PM Performed by: Clovis Cao, CRNA Pre-anesthesia Checklist: Patient identified, Emergency Drugs available, Suction available and Patient being monitored Patient Re-evaluated:Patient Re-evaluated prior to induction Oxygen Delivery Method: Circle system utilized Preoxygenation: Pre-oxygenation with 100% oxygen Induction Type: IV induction Ventilation: Mask ventilation without difficulty LMA: LMA inserted LMA Size: 4.0 Tube type: Oral Number of attempts: 1 Placement Confirmation: positive ETCO2 and breath sounds checked- equal and bilateral Tube secured with: Tape Dental Injury: Teeth and Oropharynx as per pre-operative assessment

## 2020-11-09 NOTE — Progress Notes (Signed)
Physical Therapy Treatment Patient Details Name: Keith [name] MRN: [name] DOB: [birth year] Today's Date: 11/09/2020    History of Present Illness Keith [name] is a 55 y.o. male  who is sent to the ED by his orthopedic surgery provider due to concern for left heel ulcer that had failed outpatient therapy. with medical history significant for HIV, CKD 3B, type 2 diabetes, hypertension, hyperlipidemia, coronary artery disease, status post right below the knee amputation, now s/p L BKA 10/27/20.    PT Comments    Pt incontinent of stool on entry and asked for NT to clean up. When returned later pt reports he is too tired to be able to get up to the EoB as getting cleaned up was too taxing. Educated pt on need for at least placing bed in chair position during the day to improve respiratory and digestive function. Reluctantly agrees and while in sitting participates in limited L LE AAROM and R LE AROM. Pt requesting to return to supine, PT asked to sit up for at least 30 minutes. When walked by his room 10 minutes later, bed already flattened. D/c plan remains appropriate. PT will continue to follow acutely.    Follow Up Recommendations  SNF     Equipment Recommendations  Wheelchair (measurements PT);Wheelchair cushion (measurements PT)       Precautions / Restrictions Precautions Precautions: Fall Precaution Comments: B BKA, prosthesis for Right Required Braces or Orthoses: Other Brace Other Brace: limb protector on left Restrictions Weight Bearing Restrictions: Yes LLE Weight Bearing: Non weight bearing    Mobility  Bed Mobility               General bed mobility comments: declined mobility due to        Balance Overall balance assessment: Needs assistance Sitting-balance support: Bilateral upper extremity supported;Feet unsupported Sitting balance-Leahy Scale: Poor Sitting balance - Comments: requires outside assist to maintain balance EoB                                     Cognition Arousal/Alertness: Awake/alert Behavior During Therapy: Flat affect Overall Cognitive Status: No family/caregiver present to determine baseline cognitive functioning Area of Impairment: Safety/judgement                       Following Commands: Follows multi-step commands with increased time;Follows one step commands consistently Safety/Judgement: Decreased awareness of safety   Problem Solving: Requires verbal cues;Requires tactile cues General Comments: pt lethargy improved, more conversant today but still limiting today      Exercises Amputee Exercises Hip ABduction/ADduction: AROM;Right;10 reps;Supine;Left;AAROM Hip Flexion/Marching: AROM;Right;10 reps;Supine;Left;AAROM Knee Flexion: AROM;Both;10 reps;Seated Knee Extension: AROM;Both;10 reps Straight Leg Raises: AROM;Right;10 reps;Supine;Left;AAROM    General Comments General comments (skin integrity, edema, etc.): Pt Hgb low, VSS on RA      Pertinent Vitals/Pain Pain Assessment: Faces Faces Pain Scale: Hurts a little bit Pain Location: L LE Pain Descriptors / Indicators: Discomfort;Sore     PT Goals (current goals can now be found in the care plan section) Acute Rehab PT Goals Patient Stated Goal: to get left prosthetic, to walk again PT Goal Formulation: With patient Time For Goal Achievement: 11/10/20 Potential to Achieve Goals: Fair Progress towards PT goals: Not progressing toward goals - comment (still limited by lethargy)    Frequency    Min 3X/week      PT Plan Current plan remains  appropriate       AM-PAC PT "6 Clicks" Mobility   Outcome Measure  Help needed turning from your back to your side while in a flat bed without using bedrails?: A Little Help needed moving from lying on your back to sitting on the side of a flat bed without using bedrails?: Total Help needed moving to and from a bed to a chair (including a wheelchair)?: Total Help needed  standing up from a chair using your arms (e.g., wheelchair or bedside chair)?: Total Help needed to walk in hospital room?: Total Help needed climbing 3-5 steps with a railing? : Total 6 Click Score: 8    End of Session   Activity Tolerance: Patient limited by fatigue Patient left: with call bell/phone within reach;with bed alarm set;in bed Nurse Communication: Mobility status PT Visit Diagnosis: Unsteadiness on feet (R26.81);Other abnormalities of gait and mobility (R26.89);Pain;Muscle weakness (generalized) (M62.81) Pain - Right/Left: Left Pain - part of body: Leg     Time: [id number] PT Time Calculation (min) (ACUTE ONLY): 16 min  Charges:  $Therapeutic Exercise: 8-22 mins                     Marquette Piontek B. Migdalia Dk PT, DPT Acute Rehabilitation Services Pager [phone] Office [phone]    [place] 11/09/2020, 10:55 AM

## 2020-11-09 NOTE — Anesthesia Preprocedure Evaluation (Signed)
Anesthesia Evaluation  Patient identified by MRN, date of birth, ID band Patient awake    Reviewed: Allergy & Precautions, NPO status , Patient's Chart, lab work & pertinent test results, reviewed documented beta blocker date and time   Airway Mallampati: III  TM Distance: >3 FB Neck ROM: Full    Dental  (+) Dental Advisory Given, Edentulous Upper, Edentulous Lower   Pulmonary neg pulmonary ROS,    Pulmonary exam normal breath sounds clear to auscultation       Cardiovascular hypertension, Pt. on medications and Pt. on home beta blockers + CAD, + Past MI, + Cardiac Stents and + Peripheral Vascular Disease  Normal cardiovascular exam Rhythm:Regular Rate:Normal     Neuro/Psych  Neuromuscular disease CVA    GI/Hepatic negative GI ROS, Neg liver ROS,   Endo/Other  diabetes, Type 2Morbid obesity  Renal/GU Renal InsufficiencyRenal disease     Musculoskeletal  (+) Arthritis ,   Abdominal   Peds  Hematology  (+) Blood dyscrasia, anemia , HIV,   Anesthesia Other Findings Day of surgery medications reviewed with the patient.  Reproductive/Obstetrics                             Anesthesia Physical Anesthesia Plan  ASA: 3  Anesthesia Plan: General   Post-op Pain Management:    Induction: Intravenous  PONV Risk Score and Plan: 2  Airway Management Planned: LMA  Additional Equipment:   Intra-op Plan:   Post-operative Plan: Extubation in OR  Informed Consent: I have reviewed the patients History and Physical, chart, labs and discussed the procedure including the risks, benefits and alternatives for the proposed anesthesia with the patient or authorized representative who has indicated his/her understanding and acceptance.     Dental advisory given  Plan Discussed with: CRNA  Anesthesia Plan Comments:         Anesthesia Quick Evaluation

## 2020-11-09 NOTE — Transfer of Care (Signed)
Immediate Anesthesia Transfer of Care Note  Patient: Clover Warr Driggers  Procedure(s) Performed: CYSTOSCOPY, EVACUATION OF CLOTS, FULGERATION OF BLADDER AND BILATERIAL RETROGRADE PYELOGRAMS (Bladder)  Patient Location: PACU  Anesthesia Type:General  Level of Consciousness: awake  Airway & Oxygen Therapy: Patient Spontanous Breathing and Patient connected to face mask oxygen  Post-op Assessment: Report given to RN and Post -op Vital signs reviewed and stable  Post vital signs: Reviewed and stable  Last Vitals:  Vitals Value Taken Time  BP 133/86 11/09/20 2340  Temp    Pulse 72 11/09/20 2341  Resp 11 11/09/20 2341  SpO2 97 % 11/09/20 2341  Vitals shown include unvalidated device data.  Last Pain:  Vitals:   11/09/20 2129  TempSrc: Oral  PainSc:       Patients Stated Pain Goal: 0 ([id number])  Complications: No notable events documented.

## 2020-11-09 NOTE — Anesthesia Procedure Notes (Signed)
Procedure Name: LMA Insertion Date/Time: 11/09/2020 10:20 PM Performed by: Oletta Lamas, CRNA Pre-anesthesia Checklist: Patient identified, Emergency Drugs available, Suction available and Patient being monitored Patient Re-evaluated:Patient Re-evaluated prior to induction Oxygen Delivery Method: Circle System Utilized Preoxygenation: Pre-oxygenation with 100% oxygen Induction Type: IV induction Ventilation: Mask ventilation without difficulty LMA: LMA inserted LMA Size: 4.0 Number of attempts: 1 Placement Confirmation: positive ETCO2 Tube secured with: Tape Dental Injury: Teeth and Oropharynx as per pre-operative assessment

## 2020-11-09 NOTE — Op Note (Signed)
Operative Note  Preoperative diagnosis:  1.  Gross hematuria  Postoperative diagnosis: 1.  Gross hematuria  Procedure(s): 1.  Cystoscopy 2. Clot evacuation (46m) 3. Fulguration of prostate  Surgeon: MRexene Alberts MD  Assistants:  None  Anesthesia:  General  Complications:  None  EBL:  270m Specimens: 1. None  Drains/Catheters: 1.  24Fr 3 way foley catheter  Intraoperative findings:   Cystoscopy demonstrated narrowed caliber fossa navicularis which was dilated to 30 FrPakistano accommodate the resectoscope.  He had a slight narrowing approximately 22 FrPakistann his bulbar urethra.  He had massively enlarged trilobar obstructing prostate with intravesical protrusion with hypervascularity and multiple areas of active bleeding including the intravesical lobe, median lobe towards the apex of the prostate and bilateral lobe.  Upon initial entrance into the bladder, he had his entire bladder full of clot.  I was able to evacuate 450 cc of clot. At the end the case, there was excellent hemostasis.  I placed a 24 French three-way Foley catheter to drainage and initiated continuous bladder irrigation with clear return of irrigant.   Indication:  Keith [name] a [age].o. male with PMH of HIV, CKD III, DM II, HTN, CAD, b/l BKA and AKI had a Foley catheter placed on 11/08/2020 and developed gross hematuria.  He had persistent gross hematuria for which urology was consulted.  I was able to place a 22 FrPakistanhree-way Foley catheter at bedside and irrigated some clots however there was significant resistance.  Given this, elected taken the operating room for cystoscopy and clot evacuation.  After reviewing the management options for treatment, he elected to proceed with the above surgical procedure(s). We have discussed the potential benefits and risks of the procedure, side effects of the proposed treatment, the likelihood of the patient achieving the goals of the procedure, and any potential  problems that might occur during the procedure or recuperation. Informed consent has been obtained.  Description of procedure: The patient was taken to the operating room and general anesthesia was induced.  The patient was placed in the dorsal lithotomy position, prepped and draped in the usual sterile fashion, and preoperative antibiotics were administered. A preoperative time-out was performed.   Cystourethroscopy was performed.  He had a narrowed caliber fossa navicularis which was dilated to 30 FrPakistano accommodate the resectoscope.  He also had slightly narrowed caliber 22 French bulbar urethra.  He had an elongated prostatic urethra with massively enlarged prostate with trilobar obstructing prostate with very large intravesical component.  There was hypervascularity essentially the entirety of his prostate.  Initial entrance into the bladder demonstrated a bladder that was full of clot.  I was able to place a 26 French resectoscope and irrigate this clot in its entirety.  450 cc of clot was evacuated.  I then surveyed his bladder and demonstrated no suspicious bladder lesions.  He had an enlarged bladder capacity and evidence of trabeculation.  His bilateral ureteral orifices were in their normal orthotopic position with no evidence of hematuria from either these.  I then used a bipolar loop and fulgurated his intravesical prostate which was actively bleeding.  I then fulgurated the median lobe towards the apex which was also actively bleeding.  I then fulgurated his bilateral lobes which were bleeding.  At the end of the case, there was excellent hemostasis.  I placed a 24 French three-way Foley catheter to gravity drainage.  I did initiate continuous bladder irrigation and there was initial return of  clear irrigant. The patient appeared to tolerate the procedure well and without complications.  The patient was able to be awakened and transferred to the recovery unit in satisfactory condition.    Plan: Start continuous bladder irrigation.  Manually irrigate as needed for decreased drainage or clots.  I will start finasteride.  I recommend leaving Foley catheter in place for least 3 days.  He will need to follow-up outpatient for consideration for bladder outlet obstruction procedure.  [facility]  Pager: [phone]

## 2020-11-09 NOTE — Progress Notes (Addendum)
[facility] Progress Note   Assessment/Plan: [name] with PMHx of HIV, CKDIIIb, hypertension, CAD s/p stent, type II diabetes mellitus s/p R BKA admitted for left diabetic heel ulcer concerning for osteomyelitis s/p L BKA on 7/22. Hospital course complicated by AKI on CKD.  AKI on CKDIIIb - baseline sCr ~2.5; creatinine peaked at 4.9 and improved but now with subsequent worsening.  Initial work-up with renal ultrasound negative for hydronephrosis, C3/C4 complement wnl. Hepatitis panel negative. anti-DNA, GBM antibodies, ANCA all neg. there was concern with progression to ESRD but kidney function but he improved. Second worsening likely 2/2 obstruction - Will leave RIJ TC in place for now in case he goes the wrong direction; would remove once Crt improving -ID help transition off tenofovir given concern for tubular dysfunction -Continue to monitor for renal recovery now that obstruction is being managed -1L of NS over 10hrs today  Hyperkalemia -very difficult to treat with type IV RTA pattern.  Likely associated with AKI and CKD but also obstruction as below -stop Lokelma, sodium bicarb, chlorthalidone - Continue low K diet -Management of obstruction as below -Appreciate help from ID switching off cidofovir  Non-anion gap acidosis -stop bicarb given improving labs. Urinary obstruction: Now with Foley catheter as well as hematuria.  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  Hypertension: Holding home blood pressure medications Normocytic anemia -worsening today with some hematuria and clots.  Recommend urology involvement.  Transfusions per primary team  Subjective:   Persistently high PVR yesterday requiring Foley catheter to be placed.  Did have some  clots and blood returned after placement.  Patient states he feels okay.  Creatinine slightly worse at 4.3.  Urine output adequate.  Urine is very dark.  Hemoglobin 6.6 this morning.  Objective Vitals:   11/08/20 0816 11/08/20 1653 11/08/20 2031 11/09/20 0527  BP: 122/76 (!) 189/150 111/66 121/72  Pulse: 85 (!) 107 76 81  Resp: '15 18 20 18  '$ Temp: 99.1 F (37.3 C) 98.3 F (36.8 C) 100 F (37.8 C) 100.2 F (37.9 C)  TempSrc: Oral  Oral Oral  SpO2: 100% 96% 93% 91%  Weight:      Height:       Physical Exam General: resting comfortably in bed, no acute distress Heart: Normal rate, no audible murmur Lungs: CTAB on RA;  Abdomen: soft, nondistended, nontender, +BS Extremities: bilateral BKA, wound vac on LLE  Dialysis Access: tunneled HD cath R IJ   Additional Objective Labs: Basic Metabolic Panel: Recent Labs  Lab 11/07/20 1237 11/08/20 0315 11/09/20 0355  NA 134* 135 135  K 6.1* 5.0 4.4  CL 106 98 95*  CO2 19* 23 27  GLUCOSE 185* 208* 161*  BUN 79* 79* 81*  CREATININE 4.13* 4.12* 4.33*  CALCIUM 9.7 9.6 9.4  PHOS 4.8* 4.9* 5.6*   Liver Function Tests: Recent Labs  Lab 11/07/20 1237 11/08/20 0315 11/09/20 0355  ALBUMIN 2.9* 2.6* 2.5*   No results for input(s): LIPASE, AMYLASE in the last 168 hours. CBC: Recent Labs  Lab 11/03/20 0425 11/05/20 0206 11/09/20 0856  WBC 8.9 8.5 11.5*  NEUTROABS 6.4 5.5  --   HGB 8.0* 7.6* 6.6*  HCT 25.3* 25.0* 21.6*  MCV 84.3 85.3 88.2  PLT 364 330 224   Blood Culture  Component Value Date/Time   SDES BLOOD RIGHT WRIST 10/18/2020 0439   SPECREQUEST  10/18/2020 0439    BOTTLES DRAWN AEROBIC AND ANAEROBIC Blood Culture results may not be optimal due to an inadequate volume of blood received in culture bottles   CULT  10/18/2020 0439    NO GROWTH 5 DAYS Performed at [facility], [place] [address] [place], [place] [address]    REPTSTATUS 10/23/2020 FINAL 10/18/2020 0439    Cardiac Enzymes: No results for  input(s): CKTOTAL, CKMB, CKMBINDEX, TROPONINI in the last 168 hours. CBG: Recent Labs  Lab 11/08/20 0647 11/08/20 1138 11/08/20 1648 11/08/20 2032 11/09/20 0628  GLUCAP 223* 236* 189* 151* 166*   Iron Studies: No results for input(s): IRON, TIBC, TRANSFERRIN, FERRITIN in the last 72 hours.  Studies/Results: No results found. Medications:  sodium chloride      allopurinol  100 mg Oral Daily   vitamin C  1,000 mg Oral Daily   aspirin EC  81 mg Oral Daily   atorvastatin  80 mg Oral Daily   Chlorhexidine Gluconate Cloth  6 each Topical Q0600   dapsone  100 mg Oral Daily   dolutegravir  50 mg Oral Daily   doravirine  100 mg Oral Daily   escitalopram  20 mg Oral Daily   gabapentin  100 mg Oral TID   icosapent Ethyl  2 g Oral BID   insulin aspart  0-15 Units Subcutaneous TID WC   insulin aspart  0-5 Units Subcutaneous QHS   lamiVUDine  300 mg Oral Daily   nutrition supplement (JUVEN)  1 packet Oral BID BM   oxybutynin  2.5 mg Oral BID   pantoprazole  40 mg Oral Daily   ticagrelor  90 mg Oral BID

## 2020-11-10 ENCOUNTER — Other Ambulatory Visit (HOSPITAL_COMMUNITY): Payer: Self-pay

## 2020-11-10 ENCOUNTER — Inpatient Hospital Stay (HOSPITAL_COMMUNITY): Payer: Medicare HMO

## 2020-11-10 ENCOUNTER — Encounter (HOSPITAL_COMMUNITY): Payer: Self-pay | Admitting: Urology

## 2020-11-10 ENCOUNTER — Other Ambulatory Visit: Payer: Self-pay | Admitting: *Deleted

## 2020-11-10 DIAGNOSIS — N179 Acute kidney failure, unspecified: Secondary | ICD-10-CM | POA: Diagnosis not present

## 2020-11-10 LAB — RENAL FUNCTION PANEL
Albumin: 2.5 g/dL — ABNORMAL LOW (ref 3.5–5.0)
Anion gap: 10 (ref 5–15)
BUN: 65 mg/dL — ABNORMAL HIGH (ref 6–20)
CO2: 25 mmol/L (ref 22–32)
Calcium: 8.5 mg/dL — ABNORMAL LOW (ref 8.9–10.3)
Chloride: 100 mmol/L (ref 98–111)
Creatinine, Ser: 3.26 mg/dL — ABNORMAL HIGH (ref 0.61–1.24)
GFR, Estimated: 22 mL/min — ABNORMAL LOW (ref >60)
Glucose, Bld: 184 mg/dL — ABNORMAL HIGH (ref 70–99)
Phosphorus: 4.5 mg/dL (ref 2.5–4.6)
Potassium: 3.8 mmol/L (ref 3.5–5.1)
Sodium: 135 mmol/L (ref 135–145)

## 2020-11-10 LAB — GLUCOSE, CAPILLARY
Glucose-Capillary: 130 mg/dL — ABNORMAL HIGH (ref 70–99)
Glucose-Capillary: 135 mg/dL — ABNORMAL HIGH (ref 70–99)
Glucose-Capillary: 143 mg/dL — ABNORMAL HIGH (ref 70–99)
Glucose-Capillary: 151 mg/dL — ABNORMAL HIGH (ref 70–99)
Glucose-Capillary: 193 mg/dL — ABNORMAL HIGH (ref 70–99)
Glucose-Capillary: 199 mg/dL — ABNORMAL HIGH (ref 70–99)

## 2020-11-10 LAB — HEMOGLOBIN AND HEMATOCRIT, BLOOD
HCT: 26 % — ABNORMAL LOW (ref 39.0–52.0)
Hemoglobin: 8.2 g/dL — ABNORMAL LOW (ref 13.0–17.0)

## 2020-11-10 MED ORDER — DOVATO 50-300 MG PO TABS
1.0000 | ORAL_TABLET | Freq: Every day | ORAL | 0 refills | Status: DC
  Filled 2020-11-10 – 2020-12-26 (×2): qty 30, 30d supply, fill #0

## 2020-11-10 MED ORDER — DORAVIRINE 100 MG PO TABS
100.0000 mg | ORAL_TABLET | Freq: Every day | ORAL | 0 refills | Status: DC
  Filled 2020-11-10 – 2020-12-26 (×2): qty 30, 30d supply, fill #0

## 2020-11-10 NOTE — Progress Notes (Signed)
PROGRESS NOTE    GAVEN RISKO   [id number]  DOB: 09-27-1965  DOA: 10/17/2020 [name], NP   Brief Narrative:  [name] a  55 year old male with past medical history of HIV, chronic kidney disease stage IIIb, type 2 diabetes mellitus, hypertension, hyperlipidemia, coronary artery disease, status post right below knee amputation who was sent to the emergency department by his orthopedic surgeon due to concern for left heel ulcer that has failed outpatient therapy.  MRI confirmed osteomyelitis.  He underwent left transtibial amputation by [name].  PT/OT recommended skilled nursing facility on discharge.  TOC consulted and following for disposition. Nephrology consulted  for worsening kidney function/hyperkalemia.  Underwent  temporary dialysis catheter placement for possible need of dialysis .  Nephrology closely following.  Kidney function has remained stable since last few days except for hyperkalemia.   Subjective: -Resting comfortably, -Tolerating blood irrigation well, hematuria persist No fever  Or chills     Assessment & Plan:   Principal Problem:  AKI (acute kidney injury), metabolic acidosis Urinary retention - started on dialysis during this admission but this has stopped as it was felt that he had ATN - renal ultrasound unrevealing - s/p foley placement 8/3 with clots-status post cystoscopy--urology recommends continue bladder irrigation with three-way Foley - Cr 4.33>> 3.26  Active Problems: Anemia due to acute blood loss--- hematuria  Hb 6.6 >> 8.2 after 2 units of PRBC on 11/09/20  Hyperkalemia -Potassium normalized - 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 - cont Lokelma, sodium bicarb tabs, chlorthalidone, low K diet     HIV disease ([place]) - ID has transitioned Tenofovir to tivicay, doravirine, lamivudine due to tubular dysfunction    Uncontrolled type 2 diabetes mellitus with hyperglycemia ([facility]) - cont SSI - Hemoglobin A1C    Component Value  Date/Time   HGBA1C 13.9 (H) 08/29/2020 0650      Cellulitis   Ulcer of heel  - s/p left BKA on 7/22  Disposition----awaiting SNF placement hopefully once hematuria resolves  Time spent in minutes: 35 DVT prophylaxis: SCD's Start: 10/27/20 1723 Place and maintain sequential compression device Start: 10/27/20 0559 SCDs Start: 10/18/20 0405  Code Status: full code Family Communication:  Level of Care: Level of care: Telemetry Surgical Disposition Plan:  Status is: Inpatient  Remains inpatient appropriate because:IV treatments appropriate due to intensity of illness or inability to take PO --Persistent hematuria Consultants:  Nephrology ID Procedures:  Dialysis cath BKA Antimicrobials:  Anti-infectives (From admission, onward)    Start     Dose/Rate Route Frequency Ordered Stop   11/11/20 1200  vancomycin (VANCOREADY) IVPB 500 mg/100 mL        500 mg 100 mL/hr over 60 Minutes Intravenous Every T-Th-Sa (Hemodialysis) 11/09/20 1414     11/10/20 0000  Dolutegravir-lamiVUDine (DOVATO) 50-300 MG TABS        1 tablet Oral Daily 11/10/20 1048     11/10/20 0000  doravirine (PIFELTRO) 100 MG TABS tablet        100 mg Oral Daily 11/10/20 1048     11/08/20 1000  dolutegravir (TIVICAY) tablet 50 mg        50 mg Oral Daily 11/07/20 1451     11/08/20 1000  lamiVUDine (EPIVIR) tablet 300 mg        300 mg Oral Daily 11/07/20 1451     11/08/20 1000  doravirine (PIFELTRO) tablet 100 mg        100 mg Oral Daily 11/07/20 1451  11/02/20 1130  ceFAZolin (ANCEF) IVPB 2g/100 mL premix        2 g 200 mL/hr over 30 Minutes Intravenous To Radiology 11/02/20 1042 11/02/20 1704   10/27/20 1245  ceFAZolin (ANCEF) IVPB 3g/100 mL premix        3 g 200 mL/hr over 30 Minutes Intravenous On call to O.R. 10/27/20 0755 10/27/20 1608   10/20/20 1415  linezolid (ZYVOX) tablet 600 mg  Status:  Discontinued        600 mg Oral Every 12 hours 10/20/20 1326 10/28/20 1115   10/19/20 1445  metroNIDAZOLE  (FLAGYL) tablet 500 mg  Status:  Discontinued        500 mg Oral Every 12 hours 10/19/20 1347 10/28/20 1115   10/18/20 1600  ceFEPIme (MAXIPIME) 2 g in sodium chloride 0.9 % 100 mL IVPB  Status:  Discontinued        2 g 200 mL/hr over 30 Minutes Intravenous Every 12 hours 10/18/20 0414 10/28/20 1112   10/18/20 1000  dapsone tablet 100 mg        100 mg Oral Daily 10/18/20 0425     10/18/20 1000  bictegravir-emtricitabine-tenofovir AF (BIKTARVY) 50-200-25 MG per tablet 1 tablet  Status:  Discontinued        1 tablet Oral Daily 10/18/20 0610 11/07/20 1451   10/18/20 0415  metroNIDAZOLE (FLAGYL) IVPB 500 mg  Status:  Discontinued        500 mg 100 mL/hr over 60 Minutes Intravenous Every 8 hours 10/18/20 0404 10/19/20 1347   10/18/20 0415  ceFEPIme (MAXIPIME) 2 g in sodium chloride 0.9 % 100 mL IVPB        2 g 200 mL/hr over 30 Minutes Intravenous  Once 10/18/20 0414 10/18/20 0500   10/18/20 0345  vancomycin (VANCOREADY) IVPB 2000 mg/400 mL        2,000 mg 200 mL/hr over 120 Minutes Intravenous  Once 10/18/20 0335 10/18/20 0608        Objective: Vitals:   11/10/20 0120 11/10/20 0459 11/10/20 0943 11/10/20 1831  BP: 107/76 128/89 (!) 136/93 120/67  Pulse: 71 76 73 74  Resp: '18 16 18 18  '$ Temp: 98.7 F (37.1 C) 99.2 F (37.3 C) 98.6 F (37 C) 98.5 F (36.9 C)  TempSrc: Oral Oral Oral Oral  SpO2: 98% 95% 97% 95%  Weight:      Height:        Intake/Output Summary (Last 24 hours) at 11/10/2020 1926 Last data filed at 11/10/2020 1829 Gross per 24 hour  Intake 36042 ml  Output 41750 ml  Net -5708 ml   Filed Weights   10/26/20 2221 11/06/20 2046 11/09/20 2129  Weight: 124.8 kg 124.8 kg 124.7 kg    Examination: General exam: Appears comfortable  HEENT: PERRLA, oral mucosa moist, no sclera icterus or thrush Respiratory system: Clear to auscultation. Respiratory effort normal. Cardiovascular system: S1 & S2 heard, RRR.   Gastrointestinal system: Abdomen soft, non-tender,  nondistended. Normal bowel sounds. Central nervous system: Alert and oriented. No focal neurological deficits. Extremities: No cyanosis, clubbing or edema- b/l BKA (old right BKA and New Left BKA) Skin: No rashes or ulcers Psychiatry:  Mood & affect appropriate.  GU--Foley with bladder irrigation/hematuria    Data Reviewed: I have personally reviewed following labs and imaging studies  CBC: Recent Labs  Lab 11/05/20 0206 11/09/20 0856 11/10/20 0800  WBC 8.5 11.5*  --   NEUTROABS 5.5  --   --   HGB 7.6* 6.6* 8.2*  HCT 25.0* 21.6* 26.0*  MCV 85.3 88.2  --   PLT 330 224  --    Basic Metabolic Panel: Recent Labs  Lab 11/07/20 0527 11/07/20 1237 11/08/20 0315 11/09/20 0355 11/10/20 0943  NA 134* 134* 135 135 135  K 7.1* 6.1* 5.0 4.4 3.8  CL 105 106 98 95* 100  CO2 20* 19* '23 27 25  '$ GLUCOSE 192* 185* 208* 161* 184*  BUN 78* 79* 79* 81* 65*  CREATININE 3.80* 4.13* 4.12* 4.33* 3.26*  CALCIUM 9.4 9.7 9.6 9.4 8.5*  PHOS 4.7* 4.8* 4.9* 5.6* 4.5   GFR: Estimated Creatinine Clearance: 33.9 mL/min (A) (by C-G formula based on SCr of 3.26 mg/dL (H)). Liver Function Tests: Recent Labs  Lab 11/07/20 0527 11/07/20 1237 11/08/20 0315 11/09/20 0355 11/10/20 0943  ALBUMIN 2.8* 2.9* 2.6* 2.5* 2.5*   No results for input(s): LIPASE, AMYLASE in the last 168 hours. No results for input(s): AMMONIA in the last 168 hours. Coagulation Profile: No results for input(s): INR, PROTIME in the last 168 hours. Cardiac Enzymes: No results for input(s): CKTOTAL, CKMB, CKMBINDEX, TROPONINI in the last 168 hours. BNP (last 3 results) No results for input(s): PROBNP in the last 8760 hours. HbA1C: No results for input(s): HGBA1C in the last 72 hours. CBG: Recent Labs  Lab 11/09/20 2340 11/10/20 0044 11/10/20 0652 11/10/20 1132 11/10/20 1657  GLUCAP 130* 151* 193* 199* 143*   Lipid Profile: No results for input(s): CHOL, HDL, LDLCALC, TRIG, CHOLHDL, LDLDIRECT in the last 72  hours. Thyroid Function Tests: No results for input(s): TSH, T4TOTAL, FREET4, T3FREE, THYROIDAB in the last 72 hours. Anemia Panel: No results for input(s): VITAMINB12, FOLATE, FERRITIN, TIBC, IRON, RETICCTPCT in the last 72 hours. Urine analysis:    Component Value Date/Time   COLORURINE STRAW (A) 10/07/2020 1405   APPEARANCEUR CLEAR 10/07/2020 1405   LABSPEC 1.016 10/07/2020 1405   PHURINE 7.0 10/07/2020 1405   GLUCOSEU >=500 (A) 10/07/2020 1405   HGBUR NEGATIVE 10/07/2020 1405   BILIRUBINUR NEGATIVE 10/07/2020 1405   BILIRUBINUR negative 09/07/2018 1034   KETONESUR NEGATIVE 10/07/2020 1405   PROTEINUR 100 (A) 10/07/2020 1405   UROBILINOGEN 1.0 09/07/2018 1034   UROBILINOGEN 1.0 01/01/2017 1032   NITRITE NEGATIVE 10/07/2020 1405   LEUKOCYTESUR NEGATIVE 10/07/2020 1405   Sepsis Labs: '@LABRCNTIP'$ (procalcitonin:4,lacticidven:4) )No results found for this or any previous visit (from the past 240 hour(s)).       Radiology Studies: CT ABDOMEN PELVIS WO CONTRAST  Result Date: 11/10/2020 CLINICAL DATA:  Hematuria. EXAM: CT ABDOMEN AND PELVIS WITHOUT CONTRAST TECHNIQUE: Multidetector CT imaging of the abdomen and pelvis was performed following the standard protocol without IV contrast. COMPARISON:  Renal ultrasound, 11/01/2020. FINDINGS: Lower chest: No acute abnormality. Hepatobiliary: No focal liver abnormality is seen. Dependent radiodense gallstones within the gallbladder. No gallbladder wall thickening, or biliary dilatation. Pancreas: Unremarkable. No pancreatic ductal dilatation or surrounding inflammatory changes. Spleen: Normal in size without focal abnormality. Adrenals/Urinary Tract: Adrenal glands are unremarkable. Kidneys are normal, without renal calculi, focal lesion, or hydronephrosis. Foley catheter paired distended urinary bladder, with focus of non dependent intraluminal gas consistent with catheterization. Stomach/Bowel: Stomach is within normal limits. Appendix appears  normal. No evidence of bowel wall thickening, distention, or inflammatory changes. Radiodense stool within colon, likely ingested. Vascular/Lymphatic: No significant vascular findings are present. No enlarged abdominal or pelvic lymph nodes. Reproductive: Normal noncontrast appearance of prostate and seminal vesicles. Other: Subcutaneous contusions at right abdomen, likely injection sites. No abdominal wall hernia or abnormality. No  abdominopelvic ascites. Musculoskeletal: No acute or significant osseous findings. IMPRESSION: Normal noncontrast appearance of the kidneys without radiodense calculi or hydronephrosis. If continued clinical concern for hematuria source, consider CT urogram for further evaluation. Electronically Signed   By: Michaelle Birks MD   On: 11/10/2020 11:19      Scheduled Meds:  allopurinol  100 mg Oral Daily   vitamin C  1,000 mg Oral Daily   aspirin EC  81 mg Oral Daily   atorvastatin  80 mg Oral Daily   Chlorhexidine Gluconate Cloth  6 each Topical Q0600   dapsone  100 mg Oral Daily   dolutegravir  50 mg Oral Daily   doravirine  100 mg Oral Daily   escitalopram  20 mg Oral Daily   finasteride  5 mg Oral Daily   gabapentin  100 mg Oral TID   icosapent Ethyl  2 g Oral BID   insulin aspart  0-15 Units Subcutaneous TID WC   insulin aspart  0-5 Units Subcutaneous QHS   lamiVUDine  300 mg Oral Daily   nutrition supplement (JUVEN)  1 packet Oral BID BM   oxybutynin  2.5 mg Oral BID   pantoprazole  40 mg Oral Daily   ticagrelor  90 mg Oral BID   Continuous Infusions:  sodium chloride 100 mL/hr at 11/10/20 0948   [START ON 11/11/2020] vancomycin       LOS: 23 days    Roxan Hockey, MD [facility] Pager: [email or web address] 11/10/2020, 7:26 PM

## 2020-11-10 NOTE — Progress Notes (Signed)
Foley catheter manually irrigated with 60cc of normal saline.  Minimal resistance with no clot noted.  4200cc of pink tinged fluid emptied from foley bag while in PACU.  Catheter to continuous irrigation per Dr Virgina Norfolk notes.  VSS, patient in no distress.  Will continue to monitor.

## 2020-11-10 NOTE — Progress Notes (Signed)
Patient had a clot in foley bag blocking drainage.  Changed out old drainage bad to a new one.  Bladder irrigation able to continue, as of now output is bloody tinged, but still clear.    Will continue to monitor.  Aurther Loft, RN

## 2020-11-10 NOTE — Progress Notes (Signed)
Inpatient Diabetes Program Recommendations  AACE/ADA: New Consensus Statement on Inpatient Glycemic Control (2015)  Target Ranges:  Prepandial:   less than 140 mg/dL      Peak postprandial:   less than 180 mg/dL (1-2 hours)      Critically ill patients:  140 - 180 mg/dL   Lab Results  Component Value Date   GLUCAP 199 (H) 11/10/2020   HGBA1C 13.9 (H) 08/29/2020    Review of Glycemic Control Results for Keith [name], Keith [name] (MRN [name]) as of 11/10/2020 12:15  Ref. Range 11/09/2020 21:27 11/09/2020 23:40 11/10/2020 00:44 11/10/2020 06:52 11/10/2020 11:32  Glucose-Capillary Latest Ref Range: 70 - 99 mg/dL 140 (H) 130 (H) 151 (H) 193 (H) 199 (H)  Diabetes history: DM 2 Outpatient Diabetes medications:  Novolog 70/30 30 units bid Metformin 1000 mg bid Current orders for Inpatient glycemic control:  Novolog moderate tid with meals and HS Inpatient Diabetes Program Recommendations:   Please consider adding Lantus 5 units daily.   Thanks,  Adah Perl, RN, BC-ADM Inpatient Diabetes Coordinator Pager [phone]  (8a-5p)

## 2020-11-10 NOTE — Progress Notes (Signed)
[name], male   DOB: 09-Jun-1965, 55 y.o.   MRN: [name] [facility] Progress Note   Assessment/ Plan:   1. Acute kidney Injury on chronic kidney disease stage IIIb (baseline creatinine about 2.5): Appears to have decent urine output (estimated to be about 1.4 L overnight) and labs indicate some improvement of renal function.  No indications for dialysis noted at this time and we will continue to closely monitor urine output/labs to decide on additional management.  Work-up negative for hydronephrosis/acute GN. 2.  Bladder outlet obstruction/gross hematuria: Underwent cystoscopy yesterday with evacuation of blood clots from his bladder and fulguration of prostate.  Additional management including CBI per urology. 3.  Left foot osteomyelitis status post BKA 4.  Normocytic anemia: Compounded by underlying chronic kidney disease and recent hematuria  Subjective:   Reports to be feeling fair and glad to hear that renal function is improving   Objective:   BP (!) 136/93 (BP Location: Right Arm)   Pulse 73   Temp 98.6 F (37 C) (Oral)   Resp 18   Ht '5\' 10"'$  (1.778 m)   Wt 124.7 kg   SpO2 97%   BMI 39.45 kg/m   Intake/Output Summary (Last 24 hours) at 11/10/2020 1159 Last data filed at 11/10/2020 1030 Gross per 24 hour  Intake 30724 ml  Output 32000 ml  Net -1276 ml   Weight change:   Physical Exam: Gen: Comfortably resting in bed, watching television CVS: Pulse regular rhythm, normal rate, S1 and S2 normal Resp: Clear to auscultation anteriorly bilaterally, no rales/rhonchi.  Right IJ TDC Abd: Soft, obese, nontender, bowel sounds normal Ext: Status post bilateral below-knee amputations, left lower extremity wound VAC in place  Imaging: CT ABDOMEN PELVIS WO CONTRAST  Result Date: 11/10/2020 CLINICAL DATA:  Hematuria. EXAM: CT ABDOMEN AND PELVIS WITHOUT CONTRAST TECHNIQUE: Multidetector CT imaging of the abdomen and pelvis was performed following the  standard protocol without IV contrast. COMPARISON:  Renal ultrasound, 11/01/2020. FINDINGS: Lower chest: No acute abnormality. Hepatobiliary: No focal liver abnormality is seen. Dependent radiodense gallstones within the gallbladder. No gallbladder wall thickening, or biliary dilatation. Pancreas: Unremarkable. No pancreatic ductal dilatation or surrounding inflammatory changes. Spleen: Normal in size without focal abnormality. Adrenals/Urinary Tract: Adrenal glands are unremarkable. Kidneys are normal, without renal calculi, focal lesion, or hydronephrosis. Foley catheter paired distended urinary bladder, with focus of non dependent intraluminal gas consistent with catheterization. Stomach/Bowel: Stomach is within normal limits. Appendix appears normal. No evidence of bowel wall thickening, distention, or inflammatory changes. Radiodense stool within colon, likely ingested. Vascular/Lymphatic: No significant vascular findings are present. No enlarged abdominal or pelvic lymph nodes. Reproductive: Normal noncontrast appearance of prostate and seminal vesicles. Other: Subcutaneous contusions at right abdomen, likely injection sites. No abdominal wall hernia or abnormality. No abdominopelvic ascites. Musculoskeletal: No acute or significant osseous findings. IMPRESSION: Normal noncontrast appearance of the kidneys without radiodense calculi or hydronephrosis. If continued clinical concern for hematuria source, consider CT urogram for further evaluation. Electronically Signed   By: Michaelle Birks MD   On: 11/10/2020 11:19    Labs: BMET Recent Labs  Lab 11/05/20 0206 11/06/20 0154 11/06/20 1446 11/07/20 0527 11/07/20 1237 11/08/20 0315 11/09/20 0355 11/10/20 0943  NA 133* 135 136 134* 134* 135 135 135  K 5.8* 6.1* 5.7* 7.1* 6.1* 5.0 4.4 3.8  CL 101 105 105 105 106 98 95* 100  CO2 22 20* 22 20* 19* '23 27 25  '$ GLUCOSE 161* 159* 178* 192*  185* 208* 161* 184*  BUN 79* 74* 69* 78* 79* 79* 81* 65*  CREATININE  3.60* 3.39* 3.40* 3.80* 4.13* 4.12* 4.33* 3.26*  CALCIUM 9.2 9.2 9.5 9.4 9.7 9.6 9.4 8.5*  PHOS 5.1* 4.8*  --  4.7* 4.8* 4.9* 5.6* 4.5   CBC Recent Labs  Lab 11/05/20 0206 11/09/20 0856 11/10/20 0800  WBC 8.5 11.5*  --   NEUTROABS 5.5  --   --   HGB 7.6* 6.6* 8.2*  HCT 25.0* 21.6* 26.0*  MCV 85.3 88.2  --   PLT 330 224  --     Medications:     allopurinol  100 mg Oral Daily   vitamin C  1,000 mg Oral Daily   aspirin EC  81 mg Oral Daily   atorvastatin  80 mg Oral Daily   Chlorhexidine Gluconate Cloth  6 each Topical Q0600   dapsone  100 mg Oral Daily   dolutegravir  50 mg Oral Daily   doravirine  100 mg Oral Daily   escitalopram  20 mg Oral Daily   finasteride  5 mg Oral Daily   gabapentin  100 mg Oral TID   icosapent Ethyl  2 g Oral BID   insulin aspart  0-15 Units Subcutaneous TID WC   insulin aspart  0-5 Units Subcutaneous QHS   lamiVUDine  300 mg Oral Daily   nutrition supplement (JUVEN)  1 packet Oral BID BM   oxybutynin  2.5 mg Oral BID   pantoprazole  40 mg Oral Daily   ticagrelor  90 mg Oral BID    Elmarie Shiley, MD 11/10/2020, 11:59 AM

## 2020-11-10 NOTE — Anesthesia Postprocedure Evaluation (Signed)
Anesthesia Post Note  Patient: Keith [name]  Procedure(s) Performed: CYSTOSCOPY, EVACUATION OF CLOTS, FULGERATION OF BLADDER AND BILATERIAL RETROGRADE PYELOGRAMS (Bladder)     Patient location during evaluation: PACU Anesthesia Type: General Level of consciousness: awake and alert Pain management: pain level controlled Vital Signs Assessment: post-procedure vital signs reviewed and stable Respiratory status: spontaneous breathing, nonlabored ventilation, respiratory function stable and patient connected to nasal cannula oxygen Cardiovascular status: blood pressure returned to baseline and stable Postop Assessment: no apparent nausea or vomiting Anesthetic complications: no   No notable events documented.  Last Vitals:  Vitals:   11/09/20 2340 11/09/20 2345  BP: 133/86   Pulse: 72 73  Resp: 11 11  Temp: 36.8 C   SpO2: 97% 97%    Last Pain:  Vitals:   11/09/20 2345  TempSrc:   PainSc: 0-No pain                 Catalina Gravel

## 2020-11-10 NOTE — Progress Notes (Signed)
Patient on continuous bladder irrigation. Draining of pink tinged output  to foley bag and no clots noted. Will continue to monitor patient.

## 2020-11-10 NOTE — Patient Outreach (Signed)
[place] [facility]) Care Management  11/10/2020  Keith [name] July 03, 1965 [name]  Case Closure Pt continues admission with greater then 10 days. Will close this case at this time.  Will also notify pt's provider of case closure.  [name], RN Care Management Coordinator [place] Office [phone]

## 2020-11-10 NOTE — Progress Notes (Signed)
Occupational Therapy Treatment Patient Details Name: Keith [name] MRN: [name] DOB: Mar 22, 1966 Today's Date: 11/10/2020    History of present illness Keith [name] is a 55 y.o. male  who is sent to the ED by his orthopedic surgery provider due to concern for left heel ulcer that had failed outpatient therapy. with medical history significant for HIV, CKD 3B, type 2 diabetes, hypertension, hyperlipidemia, coronary artery disease, status post right below the knee amputation, now s/p L BKA 10/27/20.   OT comments  Pt. Had procedure yesterday and is having significant pain in bladder and L LE. Pt. Preformed ADLs bed level. Pt. Was more alert today and was better able to follow directions. Pt. Is motivated to continue to work with OT. Acute OT to follow.   Follow Up Recommendations  SNF    Equipment Recommendations  None recommended by OT    Recommendations for Other Services      Precautions / Restrictions Precautions Precautions: Fall Precaution Comments: B BKA, prosthesis for Right Other Brace: limb protector on left Restrictions Other Position/Activity Restrictions: NWB on left       Mobility Bed Mobility     Rolling: Mod assist;+2 for physical assistance              Transfers                      Balance                                           ADL either performed or assessed with clinical judgement   ADL Overall ADL's : Needs assistance/impaired Eating/Feeding: Set up;Sitting   Grooming: Set up;Bed level   Upper Body Bathing: Set up;Supervision/ safety;Bed level   Lower Body Bathing: Moderate assistance;Bed level   Upper Body Dressing : Minimal assistance;Bed level   Lower Body Dressing: Maximal assistance;Bed level               Functional mobility during ADLs:  (Pt. deferred EOB or OOB secondary from severe pain from procedure yesterday.) General ADL Comments: Pt. was more alert today and had greater ability to  participate.     Vision   Vision Assessment?: No apparent visual deficits   Perception     Praxis      Cognition Arousal/Alertness: Awake/alert Behavior During Therapy: WFL for tasks assessed/performed Overall Cognitive Status: Within Functional Limits for tasks assessed                         Following Commands: Follows multi-step commands consistently                Exercises     Shoulder Instructions       General Comments      Pertinent Vitals/ Pain       Pain Assessment: 0-10 Pain Score: 9  Pain Location: L LE Pain Descriptors / Indicators: Aching Pain Intervention(s): Patient requesting pain meds-RN notified  Home Living                                          Prior Functioning/Environment              Frequency  Min 2X/week  Progress Toward Goals  OT Goals(current goals can now be found in the care plan section)  Progress towards OT goals: Progressing toward goals  Acute Rehab OT Goals Patient Stated Goal: get better and have less pain OT Goal Formulation: With patient Time For Goal Achievement: 11/24/20 Potential to Achieve Goals: Good ADL Goals Pt Will Perform Lower Body Bathing: with min assist;sitting/lateral leans Pt Will Perform Upper Body Dressing: with set-up;sitting Pt Will Perform Lower Body Dressing: with min assist;sitting/lateral leans Pt Will Transfer to Toilet: with min assist;stand pivot transfer Pt Will Perform Toileting - Clothing Manipulation and hygiene: with set-up;sitting/lateral leans  Plan Discharge plan remains appropriate    Co-evaluation                 AM-PAC OT "6 Clicks" Daily Activity     Outcome Measure   Help from another person eating meals?: None Help from another person taking care of personal grooming?: A Little Help from another person toileting, which includes using toliet, bedpan, or urinal?: Total Help from another person bathing (including  washing, rinsing, drying)?: A Lot Help from another person to put on and taking off regular upper body clothing?: A Little Help from another person to put on and taking off regular lower body clothing?: A Lot 6 Click Score: 15    End of Session    OT Visit Diagnosis: Other abnormalities of gait and mobility (R26.89);Muscle weakness (generalized) (M62.81);Pain   Activity Tolerance Patient limited by pain   Patient Left in bed;with call bell/phone within reach;with bed alarm set   Nurse Communication Patient requests pain meds        Time: [id number] OT Time Calculation (min): 44 min  Charges: OT General Charges $OT Visit: 1 Visit OT Treatments $Self Care/Home Management : 38-52 mins  [name] OT/L    Devonte Migues 11/10/2020, 10:35 AM

## 2020-11-11 DIAGNOSIS — N289 Disorder of kidney and ureter, unspecified: Secondary | ICD-10-CM | POA: Diagnosis not present

## 2020-11-11 DIAGNOSIS — B20 Human immunodeficiency virus [HIV] disease: Secondary | ICD-10-CM | POA: Diagnosis not present

## 2020-11-11 DIAGNOSIS — Z89512 Acquired absence of left leg below knee: Secondary | ICD-10-CM | POA: Diagnosis not present

## 2020-11-11 LAB — RENAL FUNCTION PANEL
Albumin: 2.2 g/dL — ABNORMAL LOW (ref 3.5–5.0)
Anion gap: 8 (ref 5–15)
BUN: 49 mg/dL — ABNORMAL HIGH (ref 6–20)
CO2: 26 mmol/L (ref 22–32)
Calcium: 8.2 mg/dL — ABNORMAL LOW (ref 8.9–10.3)
Chloride: 103 mmol/L (ref 98–111)
Creatinine, Ser: 2.7 mg/dL — ABNORMAL HIGH (ref 0.61–1.24)
GFR, Estimated: 27 mL/min — ABNORMAL LOW (ref >60)
Glucose, Bld: 131 mg/dL — ABNORMAL HIGH (ref 70–99)
Phosphorus: 3.7 mg/dL (ref 2.5–4.6)
Potassium: 3.7 mmol/L (ref 3.5–5.1)
Sodium: 137 mmol/L (ref 135–145)

## 2020-11-11 LAB — GLUCOSE, CAPILLARY
Glucose-Capillary: 134 mg/dL — ABNORMAL HIGH (ref 70–99)
Glucose-Capillary: 171 mg/dL — ABNORMAL HIGH (ref 70–99)
Glucose-Capillary: 169 mg/dL — ABNORMAL HIGH (ref 70–99)
Glucose-Capillary: 179 mg/dL — ABNORMAL HIGH (ref 70–99)

## 2020-11-11 NOTE — Progress Notes (Signed)
[name], male   DOB: 04/24/1965, 55 y.o.   MRN: [name] [facility] Progress Note   Assessment/ Plan:   1. Acute kidney Injury on chronic kidney disease stage IIIb (baseline creatinine about 2.5): Appears to have decent urine output (estimated to be about 1.85 L overnight in the setting of continuous bladder irrigation) and labs indicate continued improvement of renal function.  His prior work-up was negative for hydronephrosis/acute GN.  If renal function continues to improve on labs tomorrow, will order for removal of right IJ TDC. 2.  Bladder outlet obstruction/gross hematuria: Underwent cystoscopy yesterday with evacuation of blood clots from his bladder and fulguration of prostate.  Additional management including CBI per urology. 3.  Left foot osteomyelitis status post BKA: Additional management per Dr. Jess Barters recommendations-plans noted for possible admission to SNF. 4.  Normocytic anemia: Compounded by underlying chronic kidney disease and recent hematuria  Subjective:   Complains of some diarrhea overnight without melena/hematochezia   Objective:   BP 106/67 (BP Location: Right Arm)   Pulse 67   Temp 98 F (36.7 C) (Oral)   Resp 16   Ht '5\' 10"'$  (1.778 m)   Wt 124.7 kg   SpO2 93%   BMI 39.45 kg/m   Intake/Output Summary (Last 24 hours) at 11/11/2020 1037 Last data filed at 11/11/2020 0841 Gross per 24 hour  Intake 12000 ml  Output 22850 ml  Net -10850 ml   Weight change:   Physical Exam: Gen: Comfortably resting in bed, watching television CVS: Pulse regular rhythm, normal rate, S1 and S2 normal Resp: Clear to auscultation anteriorly bilaterally, no rales/rhonchi.  Right IJ TDC Abd: Soft, obese, nontender, bowel sounds normal Ext: Status post bilateral below-knee amputations, left lower extremity wound VAC in place with some blood oozing around dressing  Imaging: CT ABDOMEN PELVIS WO CONTRAST  Result Date: 11/10/2020 CLINICAL DATA:   Hematuria. EXAM: CT ABDOMEN AND PELVIS WITHOUT CONTRAST TECHNIQUE: Multidetector CT imaging of the abdomen and pelvis was performed following the standard protocol without IV contrast. COMPARISON:  Renal ultrasound, 11/01/2020. FINDINGS: Lower chest: No acute abnormality. Hepatobiliary: No focal liver abnormality is seen. Dependent radiodense gallstones within the gallbladder. No gallbladder wall thickening, or biliary dilatation. Pancreas: Unremarkable. No pancreatic ductal dilatation or surrounding inflammatory changes. Spleen: Normal in size without focal abnormality. Adrenals/Urinary Tract: Adrenal glands are unremarkable. Kidneys are normal, without renal calculi, focal lesion, or hydronephrosis. Foley catheter paired distended urinary bladder, with focus of non dependent intraluminal gas consistent with catheterization. Stomach/Bowel: Stomach is within normal limits. Appendix appears normal. No evidence of bowel wall thickening, distention, or inflammatory changes. Radiodense stool within colon, likely ingested. Vascular/Lymphatic: No significant vascular findings are present. No enlarged abdominal or pelvic lymph nodes. Reproductive: Normal noncontrast appearance of prostate and seminal vesicles. Other: Subcutaneous contusions at right abdomen, likely injection sites. No abdominal wall hernia or abnormality. No abdominopelvic ascites. Musculoskeletal: No acute or significant osseous findings. IMPRESSION: Normal noncontrast appearance of the kidneys without radiodense calculi or hydronephrosis. If continued clinical concern for hematuria source, consider CT urogram for further evaluation. Electronically Signed   By: Michaelle Birks MD   On: 11/10/2020 11:19    Labs: BMET Recent Labs  Lab 11/06/20 0154 11/06/20 1446 11/07/20 [id number] 11/07/20 1237 11/08/20 0315 11/09/20 0355 11/10/20 0943 11/11/20 0410  NA 135 136 134* 134* 135 135 135 137  K 6.1* 5.7* 7.1* 6.1* 5.0 4.4 3.8 3.7  CL 105 105 105 106 98  95* 100 103  CO2 20* 22 20* 19* '23 27 25 26  '$ GLUCOSE 159* 178* 192* 185* 208* 161* 184* 131*  BUN 74* 69* 78* 79* 79* 81* 65* 49*  CREATININE 3.39* 3.40* 3.80* 4.13* 4.12* 4.33* 3.26* 2.70*  CALCIUM 9.2 9.5 9.4 9.7 9.6 9.4 8.5* 8.2*  PHOS 4.8*  --  4.7* 4.8* 4.9* 5.6* 4.5 3.7   CBC Recent Labs  Lab 11/05/20 0206 11/09/20 0856 11/10/20 0800  WBC 8.5 11.5*  --   NEUTROABS 5.5  --   --   HGB 7.6* 6.6* 8.2*  HCT 25.0* 21.6* 26.0*  MCV 85.3 88.2  --   PLT 330 224  --     Medications:     allopurinol  100 mg Oral Daily   vitamin C  1,000 mg Oral Daily   aspirin EC  81 mg Oral Daily   atorvastatin  80 mg Oral Daily   Chlorhexidine Gluconate Cloth  6 each Topical Q0600   dapsone  100 mg Oral Daily   dolutegravir  50 mg Oral Daily   doravirine  100 mg Oral Daily   escitalopram  20 mg Oral Daily   finasteride  5 mg Oral Daily   gabapentin  100 mg Oral TID   icosapent Ethyl  2 g Oral BID   insulin aspart  0-15 Units Subcutaneous TID WC   insulin aspart  0-5 Units Subcutaneous QHS   lamiVUDine  300 mg Oral Daily   nutrition supplement (JUVEN)  1 packet Oral BID BM   oxybutynin  2.5 mg Oral BID   pantoprazole  40 mg Oral Daily   ticagrelor  90 mg Oral BID    Elmarie Shiley, MD 11/11/2020, 10:37 AM

## 2020-11-11 NOTE — Progress Notes (Signed)
2 Days Post-Op Subjective: Pain controlled.  No nausea or emesis.  Tolerating Foley catheter.  Urine light pink on slow drip CBI  Objective: Vital signs in last 24 hours: Temp:  [98 F (36.7 C)-99 F (37.2 C)] 98 F (36.7 C) (08/06 0831) Pulse Rate:  [67-74] 67 (08/06 0831) Resp:  [16-18] 16 (08/06 0831) BP: (106-120)/(66-67) 106/67 (08/06 0831) SpO2:  [93 %-95 %] 93 % (08/06 0831)  Intake/Output from previous day: 08/05 0701 - 08/06 0700 In: 15120 [P.O.:120] Out: 25550 [Urine:25550] Intake/Output this shift: Total I/O In: 0  Out: 2500 [Urine:2500]  UOP: 28L  Physical Exam:  General: Alert and oriented CV: RRR Lungs: Clear Abdomen: Soft, ND, NT Ext: NT, No erythema  Lab Results: Recent Labs    11/09/20 0856 11/10/20 0800  HGB 6.6* 8.2*  HCT 21.6* 26.0*   BMET Recent Labs    11/10/20 0943 11/11/20 0410  NA 135 137  K 3.8 3.7  CL 100 103  CO2 25 26  GLUCOSE 184* 131*  BUN 65* 49*  CREATININE 3.26* 2.70*  CALCIUM 8.5* 8.2*     Studies/Results: CT ABDOMEN PELVIS WO CONTRAST  Result Date: 11/10/2020 CLINICAL DATA:  Hematuria. EXAM: CT ABDOMEN AND PELVIS WITHOUT CONTRAST TECHNIQUE: Multidetector CT imaging of the abdomen and pelvis was performed following the standard protocol without IV contrast. COMPARISON:  Renal ultrasound, 11/01/2020. FINDINGS: Lower chest: No acute abnormality. Hepatobiliary: No focal liver abnormality is seen. Dependent radiodense gallstones within the gallbladder. No gallbladder wall thickening, or biliary dilatation. Pancreas: Unremarkable. No pancreatic ductal dilatation or surrounding inflammatory changes. Spleen: Normal in size without focal abnormality. Adrenals/Urinary Tract: Adrenal glands are unremarkable. Kidneys are normal, without renal calculi, focal lesion, or hydronephrosis. Foley catheter paired distended urinary bladder, with focus of non dependent intraluminal gas consistent with catheterization. Stomach/Bowel: Stomach is  within normal limits. Appendix appears normal. No evidence of bowel wall thickening, distention, or inflammatory changes. Radiodense stool within colon, likely ingested. Vascular/Lymphatic: No significant vascular findings are present. No enlarged abdominal or pelvic lymph nodes. Reproductive: Normal noncontrast appearance of prostate and seminal vesicles. Other: Subcutaneous contusions at right abdomen, likely injection sites. No abdominal wall hernia or abnormality. No abdominopelvic ascites. Musculoskeletal: No acute or significant osseous findings. IMPRESSION: Normal noncontrast appearance of the kidneys without radiodense calculi or hydronephrosis. If continued clinical concern for hematuria source, consider CT urogram for further evaluation. Electronically Signed   By: Michaelle Birks MD   On: 11/10/2020 11:19    Assessment/Plan: Keith [name] hematuria: S/p clot evacuation, fulguration of prostate on 11/09/2020  - Continue to wean CBI to off. Continue finasteride. Urology to continue to follow   LOS: 24 days   Matt R. Gay MD 11/11/2020, 3:29 PM [facility]  Pager: [phone]

## 2020-11-11 NOTE — Progress Notes (Signed)
PROGRESS NOTE    Keith [name]   [id number]  DOB: [birth year]  DOA: 10/17/2020 [name], NP   Brief Narrative:  Keith [name] a  55 year old male with past medical history of HIV, chronic kidney disease stage IIIb, type 2 diabetes mellitus, hypertension, hyperlipidemia, coronary artery disease, status post right below knee amputation who was sent to the emergency department by his orthopedic surgeon due to concern for left heel ulcer that has failed outpatient therapy.  MRI confirmed osteomyelitis.  He underwent left transtibial amputation by [name].  PT/OT recommended skilled nursing facility on discharge.  TOC consulted and following for disposition. Nephrology consulted  for worsening kidney function/hyperkalemia.  Underwent  temporary dialysis catheter placement for possible need of dialysis .  Nephrology closely following.  Kidney function has remained stable since last few days except for hyperkalemia.   Subjective: No complaints.     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 - cont Lokelma, sodium bicarb tabs, chlorthalidone, low K diet     HIV disease (HCC) - ID has transitioned Tenofovir to tivicay, doravirine, lamivudine due to tubular dysfunction    Uncontrolled type 2 diabetes mellitus with hyperglycemia (HCC) - cont SSI - Hemoglobin A1C    Component Value Date/Time   HGBA1C 13.9 (H) 08/29/2020 0650      Cellulitis   Ulcer of heel  - s/p left BKA on 7/22    Time spent in minutes: 35 DVT prophylaxis: SCD's  Start: 10/27/20 1723 Place and maintain sequential compression device Start: 10/27/20 0559 SCDs Start: 10/18/20 0405 Code Status: full code Family Communication:  Level of Care: Level of care: Telemetry Surgical Disposition Plan:  Status is: Inpatient  Remains inpatient appropriate because:IV treatments appropriate due to intensity of illness or inability to take PO  Dispo:  Patient From:  home  Planned Disposition:  SNF  Medically stable for discharge:  no    Consultants:  Nephrology ID Ortho Procedures:  Dialysis cath BKA Antimicrobials:  Anti-infectives (From admission, onward)    Start     Dose/Rate Route Frequency Ordered Stop   11/11/20 1200  vancomycin (VANCOREADY) IVPB 500 mg/100 mL  Status:  Discontinued        500 mg 100 mL/hr over 60 Minutes Intravenous Every T-Th-Sa (Hemodialysis) 11/09/20 1414 11/11/20 1328   11/10/20 0000  Dolutegravir-lamiVUDine (DOVATO) 50-300 MG TABS        1 tablet Oral Daily 11/10/20 1048     11/10/20 0000  doravirine (PIFELTRO) 100 MG TABS tablet        100 mg Oral Daily 11/10/20 1048     11/08/20 1000  dolutegravir (TIVICAY) tablet 50 mg        50 mg Oral Daily 11/07/20 1451     11/08/20 1000  lamiVUDine (EPIVIR) tablet 300 mg        300 mg Oral Daily 11/07/20 1451     11/08/20 1000  doravirine (PIFELTRO) tablet 100 mg        100 mg Oral  Daily 11/07/20 1451     11/02/20 1130  ceFAZolin (ANCEF) IVPB 2g/100 mL premix        2 g 200 mL/hr over 30 Minutes Intravenous To Radiology 11/02/20 1042 11/02/20 1704   10/27/20 1245  ceFAZolin (ANCEF) IVPB 3g/100 mL premix        3 g 200 mL/hr over 30 Minutes Intravenous On call to O.R. 10/27/20 0755 10/27/20 1608   10/20/20 1415  linezolid (ZYVOX) tablet 600 mg  Status:  Discontinued        600 mg Oral Every 12 hours 10/20/20 1326 10/28/20 1115   10/19/20 1445  metroNIDAZOLE (FLAGYL) tablet 500 mg  Status:  Discontinued        500 mg Oral Every 12 hours 10/19/20 1347 10/28/20 1115   10/18/20  1600  ceFEPIme (MAXIPIME) 2 g in sodium chloride 0.9 % 100 mL IVPB  Status:  Discontinued        2 g 200 mL/hr over 30 Minutes Intravenous Every 12 hours 10/18/20 0414 10/28/20 1112   10/18/20 1000  dapsone tablet 100 mg        100 mg Oral Daily 10/18/20 0425     10/18/20 1000  bictegravir-emtricitabine-tenofovir AF (BIKTARVY) 50-200-25 MG per tablet 1 tablet  Status:  Discontinued        1 tablet Oral Daily 10/18/20 0610 11/07/20 1451   10/18/20 0415  metroNIDAZOLE (FLAGYL) IVPB 500 mg  Status:  Discontinued        500 mg 100 mL/hr over 60 Minutes Intravenous Every 8 hours 10/18/20 0404 10/19/20 1347   10/18/20 0415  ceFEPIme (MAXIPIME) 2 g in sodium chloride 0.9 % 100 mL IVPB        2 g 200 mL/hr over 30 Minutes Intravenous  Once 10/18/20 0414 10/18/20 0500   10/18/20 0345  vancomycin (VANCOREADY) IVPB 2000 mg/400 mL        2,000 mg 200 mL/hr over 120 Minutes Intravenous  Once 10/18/20 0335 10/18/20 0608        Objective: Vitals:   11/10/20 0943 11/10/20 1831 11/10/20 2133 11/11/20 0831  BP: (!) 136/93 120/67 118/66 106/67  Pulse: 73 74 67 67  Resp: '18 18 18 16  '$ Temp: [id number] F (37 C) 98.5 F (36.9 C) 99 F (37.2 C) 98 F (36.7 C)  TempSrc: Oral Oral Oral Oral  SpO2: 97% 95% 95% 93%  Weight:      Height:        Intake/Output Summary (Last 24 hours) at 11/11/2020 1558 Last data filed at 11/11/2020 1144 Gross per 24 hour  Intake 6000 ml  Output 17100 ml  Net -11100 ml    Filed Weights   10/26/20 2221 11/06/20 2046 11/09/20 2129  Weight: 124.8 kg 124.8 kg 124.7 kg    Examination: General exam: Appears comfortable  HEENT: PERRLA, oral mucosa moist, no sclera icterus or thrush Respiratory system: Clear to auscultation. Respiratory effort normal. Cardiovascular system: S1 & S2 heard, regular rate and rhythm Gastrointestinal system: Abdomen soft, non-tender, nondistended. Normal bowel sounds   Central nervous system: Alert and oriented. No focal neurological  deficits. Extremities: No cyanosis, clubbing or edema- b/l BKA Skin: No rashes or ulcers Psychiatry:  Mood & affect appropriate.      Data Reviewed: I have personally reviewed following labs and imaging studies  CBC: Recent Labs  Lab 11/05/20 0206 11/09/20 0856 11/10/20 0800  WBC 8.5 11.5*  --   NEUTROABS 5.5  --   --   HGB 7.6* 6.6*  8.2*  HCT 25.0* 21.6* 26.0*  MCV 85.3 88.2  --   PLT 330 224  --     Basic Metabolic Panel: Recent Labs  Lab 11/07/20 1237 11/08/20 0315 11/09/20 0355 11/10/20 0943 11/11/20 0410  NA 134* 135 135 135 137  K 6.1* 5.0 4.4 3.8 3.7  CL 106 98 95* 100 103  CO2 19* '23 27 25 26  '$ GLUCOSE 185* 208* 161* 184* 131*  BUN 79* 79* 81* 65* 49*  CREATININE 4.13* 4.12* 4.33* 3.26* 2.70*  CALCIUM 9.7 9.6 9.4 8.5* 8.2*  PHOS 4.8* 4.9* 5.6* 4.5 3.7    GFR: Estimated Creatinine Clearance: 41 mL/min (A) (by C-G formula based on SCr of 2.7 mg/dL (H)). Liver Function Tests: Recent Labs  Lab 11/07/20 1237 11/08/20 0315 11/09/20 0355 11/10/20 0943 11/11/20 0410  ALBUMIN 2.9* 2.6* 2.5* 2.5* 2.2*    No results for input(s): LIPASE, AMYLASE in the last 168 hours. No results for input(s): AMMONIA in the last 168 hours. Coagulation Profile: No results for input(s): INR, PROTIME in the last 168 hours. Cardiac Enzymes: No results for input(s): CKTOTAL, CKMB, CKMBINDEX, TROPONINI in the last 168 hours. BNP (last 3 results) No results for input(s): PROBNP in the last 8760 hours. HbA1C: No results for input(s): HGBA1C in the last 72 hours. CBG: Recent Labs  Lab 11/10/20 1132 11/10/20 1657 11/10/20 2131 11/11/20 0837 11/11/20 1140  GLUCAP 199* 143* 135* 134* 171*    Lipid Profile: No results for input(s): CHOL, HDL, LDLCALC, TRIG, CHOLHDL, LDLDIRECT in the last 72 hours. Thyroid Function Tests: No results for input(s): TSH, T4TOTAL, FREET4, T3FREE, THYROIDAB in the last 72 hours. Anemia Panel: No results for input(s): VITAMINB12, FOLATE,  FERRITIN, TIBC, IRON, RETICCTPCT in the last 72 hours. Urine analysis:    Component Value Date/Time   COLORURINE STRAW (A) 10/07/2020 1405   APPEARANCEUR CLEAR 10/07/2020 1405   LABSPEC 1.016 10/07/2020 1405   PHURINE 7.0 10/07/2020 1405   GLUCOSEU >=500 (A) 10/07/2020 1405   HGBUR NEGATIVE 10/07/2020 1405   BILIRUBINUR NEGATIVE 10/07/2020 1405   BILIRUBINUR negative 09/07/2018 1034   KETONESUR NEGATIVE 10/07/2020 1405   PROTEINUR 100 (A) 10/07/2020 1405   UROBILINOGEN 1.0 09/07/2018 1034   UROBILINOGEN 1.0 01/01/2017 1032   NITRITE NEGATIVE 10/07/2020 1405   LEUKOCYTESUR NEGATIVE 10/07/2020 1405   Sepsis Labs: '@LABRCNTIP'$ (procalcitonin:4,lacticidven:4) )No results found for this or any previous visit (from the past 240 hour(s)).       Radiology Studies: CT ABDOMEN PELVIS WO CONTRAST  Result Date: 11/10/2020 CLINICAL DATA:  Hematuria. EXAM: CT ABDOMEN AND PELVIS WITHOUT CONTRAST TECHNIQUE: Multidetector CT imaging of the abdomen and pelvis was performed following the standard protocol without IV contrast. COMPARISON:  Renal ultrasound, 11/01/2020. FINDINGS: Lower chest: No acute abnormality. Hepatobiliary: No focal liver abnormality is seen. Dependent radiodense gallstones within the gallbladder. No gallbladder wall thickening, or biliary dilatation. Pancreas: Unremarkable. No pancreatic ductal dilatation or surrounding inflammatory changes. Spleen: Normal in size without focal abnormality. Adrenals/Urinary Tract: Adrenal glands are unremarkable. Kidneys are normal, without renal calculi, focal lesion, or hydronephrosis. Foley catheter paired distended urinary bladder, with focus of non dependent intraluminal gas consistent with catheterization. Stomach/Bowel: Stomach is within normal limits. Appendix appears normal. No evidence of bowel wall thickening, distention, or inflammatory changes. Radiodense stool within colon, likely ingested. Vascular/Lymphatic: No significant vascular  findings are present. No enlarged abdominal or pelvic lymph nodes. Reproductive: Normal noncontrast appearance of prostate and seminal vesicles. Other: Subcutaneous contusions at right abdomen, likely injection sites. No  abdominal wall hernia or abnormality. No abdominopelvic ascites. Musculoskeletal: No acute or significant osseous findings. IMPRESSION: Normal noncontrast appearance of the kidneys without radiodense calculi or hydronephrosis. If continued clinical concern for hematuria source, consider CT urogram for further evaluation. Electronically Signed   By: Michaelle Birks MD   On: 11/10/2020 11:19      Scheduled Meds:  allopurinol  100 mg Oral Daily   vitamin C  1,000 mg Oral Daily   aspirin EC  81 mg Oral Daily   atorvastatin  80 mg Oral Daily   Chlorhexidine Gluconate Cloth  6 each Topical Q0600   dapsone  100 mg Oral Daily   dolutegravir  50 mg Oral Daily   doravirine  100 mg Oral Daily   escitalopram  20 mg Oral Daily   finasteride  5 mg Oral Daily   gabapentin  100 mg Oral TID   icosapent Ethyl  2 g Oral BID   insulin aspart  0-15 Units Subcutaneous TID WC   insulin aspart  0-5 Units Subcutaneous QHS   lamiVUDine  300 mg Oral Daily   nutrition supplement (JUVEN)  1 packet Oral BID BM   oxybutynin  2.5 mg Oral BID   pantoprazole  40 mg Oral Daily   ticagrelor  90 mg Oral BID   Continuous Infusions:  sodium chloride 100 mL/hr at 11/11/20 1017     LOS: 24 days      Debbe Odea, MD [facility] Pager: [email or web address] 11/11/2020, 3:58 PM

## 2020-11-12 DIAGNOSIS — N179 Acute kidney failure, unspecified: Secondary | ICD-10-CM | POA: Diagnosis not present

## 2020-11-12 DIAGNOSIS — M86172 Other acute osteomyelitis, left ankle and foot: Secondary | ICD-10-CM | POA: Diagnosis not present

## 2020-11-12 DIAGNOSIS — B20 Human immunodeficiency virus [HIV] disease: Secondary | ICD-10-CM | POA: Diagnosis not present

## 2020-11-12 LAB — PREPARE RBC (CROSSMATCH)

## 2020-11-12 LAB — CBC
HCT: 21.7 % — ABNORMAL LOW (ref 39.0–52.0)
HCT: 25.1 % — ABNORMAL LOW (ref 39.0–52.0)
Hemoglobin: 6.9 g/dL — CL (ref 13.0–17.0)
Hemoglobin: 8 g/dL — ABNORMAL LOW (ref 13.0–17.0)
MCH: 28.3 pg (ref 26.0–34.0)
MCH: 28.7 pg (ref 26.0–34.0)
MCHC: 31.8 g/dL (ref 30.0–36.0)
MCHC: 31.9 g/dL (ref 30.0–36.0)
MCV: 88.9 fL (ref 80.0–100.0)
MCV: 90 fL (ref 80.0–100.0)
Platelets: 200 10*3/uL (ref 150–400)
Platelets: 216 10*3/uL (ref 150–400)
RBC: 2.44 MIL/uL — ABNORMAL LOW (ref 4.22–5.81)
RBC: 2.79 MIL/uL — ABNORMAL LOW (ref 4.22–5.81)
RDW: 14.7 % (ref 11.5–15.5)
RDW: 14.8 % (ref 11.5–15.5)
WBC: 12.2 10*3/uL — ABNORMAL HIGH (ref 4.0–10.5)
WBC: 11.3 10*3/uL — ABNORMAL HIGH (ref 4.0–10.5)
nRBC: 0.2 % (ref 0.0–0.2)
nRBC: 0.2 % (ref 0.0–0.2)

## 2020-11-12 LAB — RENAL FUNCTION PANEL
Albumin: 2.2 g/dL — ABNORMAL LOW (ref 3.5–5.0)
Anion gap: 8 (ref 5–15)
BUN: 38 mg/dL — ABNORMAL HIGH (ref 6–20)
CO2: 25 mmol/L (ref 22–32)
Calcium: 8.2 mg/dL — ABNORMAL LOW (ref 8.9–10.3)
Chloride: 104 mmol/L (ref 98–111)
Creatinine, Ser: 2.36 mg/dL — ABNORMAL HIGH (ref 0.61–1.24)
GFR, Estimated: 32 mL/min — ABNORMAL LOW (ref >60)
Glucose, Bld: 165 mg/dL — ABNORMAL HIGH (ref 70–99)
Phosphorus: 3 mg/dL (ref 2.5–4.6)
Potassium: 3.8 mmol/L (ref 3.5–5.1)
Sodium: 137 mmol/L (ref 135–145)

## 2020-11-12 LAB — GLUCOSE, CAPILLARY
Glucose-Capillary: 178 mg/dL — ABNORMAL HIGH (ref 70–99)
Glucose-Capillary: 160 mg/dL — ABNORMAL HIGH (ref 70–99)
Glucose-Capillary: 155 mg/dL — ABNORMAL HIGH (ref 70–99)
Glucose-Capillary: 145 mg/dL — ABNORMAL HIGH (ref 70–99)

## 2020-11-12 MED ORDER — SODIUM CHLORIDE 0.9% IV SOLUTION: Freq: Once | INTRAVENOUS | Status: AC

## 2020-11-12 NOTE — Plan of Care (Signed)
  Problem: Nutrition: Goal: Adequate nutrition will be maintained Outcome: Adequate for Discharge   

## 2020-11-12 NOTE — Progress Notes (Signed)
PROGRESS NOTE    Keith [name]   [id number]  DOB: 30-Nov-1965  DOA: 10/17/2020 [name], NP   Brief Narrative:  Keith [name] a  55 year old male with past medical history of HIV, chronic kidney disease stage IIIb, type 2 diabetes mellitus, hypertension, hyperlipidemia, coronary artery disease, status post right below knee amputation who was sent to the emergency department by his orthopedic surgeon due to concern for left heel ulcer that has failed outpatient therapy.    MRI confirmed osteomyelitis.  He underwent left transtibial amputation by [name].  PT/OT recommended skilled nursing facility on discharge.  TOC consulted and following for disposition. Nephrology consulted  for worsening kidney function/hyperkalemia.  Underwent  temporary dialysis catheter placement for possible need of dialysis. Suspected to have ATN. Later developed hematuria and required fulguration and CBI along with blood transfusions. Wound dehisced over the weekend and there was significant bleeding  Subjective: He has no complaints.     Assessment & Plan:   Principal Problem:  AKI (acute kidney injury), metabolic acidosis Urinary retention - started on dialysis during this admission but this has stopped as it was felt that he had ATN - renal ultrasound unrevealing - s/p foley placement 8/3 with clots-  urology consulted - 8/4 - cystoscopy with fulguration done and started CBI and Proscar - hematuria improving - Cr  4.33> 3.26> 2.70> 2.36  Active Problems: Anemia due to acute blood loss from bladder and left stump - transfuse 2 U PRBC for Hb 6.6 on 8/5 - transfused 1 U PRBC today - follow closely  Left heel osteo and abscess  - s/p transtibial amputation on 7/22 by Dr Sharol Given -  dehiscence of wound with bleeding - have notified orthopedic surgery and they will see him tomorrow  CAD s/p stenting in 12/21 Dyslipidemia - cont Brillinta, ASA, Vascepa and Lipitor  Hyperkalemia - K  7.1 - 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 - treated with Lokelma, sodium bicarb tabs, chlorthalidone, low K diet     HIV disease ([place]) - ID has transitioned Tenofovir to tivicay, doravirine, lamivudine due to tubular dysfunction - on Dapsone as well    Uncontrolled type 2 diabetes mellitus with hyperglycemia ([place]) - cont SSI - Hemoglobin A1C    Component Value Date/Time   HGBA1C 13.9 (H) 08/29/2020 0650         Time spent in minutes: 35 DVT prophylaxis: SCD's Start: 10/27/20 1723 Place and maintain sequential compression device Start: 10/27/20 0559 SCDs Start: 10/18/20 0405 Code Status: full code Family Communication:  Level of Care: Level of care: Telemetry Surgical Disposition Plan:  Status is: Inpatient  Remains inpatient appropriate because:IV treatments appropriate due to intensity of illness or inability to take PO  Dispo:  Patient From:  home  Planned Disposition:  SNF  Medically stable for discharge:  no    Consultants:  Nephrology ID Ortho Procedures:  Dialysis cath BKA Antimicrobials:  Anti-infectives (From admission, onward)    Start     Dose/Rate Route Frequency Ordered Stop   11/11/20 1200  vancomycin (VANCOREADY) IVPB 500 mg/100 mL  Status:  Discontinued        500 mg 100 mL/hr over 60 Minutes Intravenous Every T-Th-Sa (Hemodialysis) 11/09/20 1414 11/11/20 1328   11/10/20 0000  Dolutegravir-lamiVUDine (DOVATO) 50-300 MG TABS        1 tablet Oral Daily 11/10/20 1048     11/10/20 0000  doravirine (PIFELTRO) 100 MG TABS tablet  100 mg Oral Daily 11/10/20 1048     11/08/20 1000  dolutegravir (TIVICAY) tablet 50 mg        50 mg Oral Daily 11/07/20 1451     11/08/20 1000  lamiVUDine (EPIVIR) tablet 300 mg        300 mg Oral Daily 11/07/20 1451     11/08/20 1000  doravirine (PIFELTRO) tablet 100 mg        100 mg Oral Daily 11/07/20 1451     11/02/20 1130  ceFAZolin (ANCEF) IVPB 2g/100 mL premix        2 g 200 mL/hr over 30 Minutes Intravenous To  Radiology 11/02/20 1042 11/02/20 1704   10/27/20 1245  ceFAZolin (ANCEF) IVPB 3g/100 mL premix        3 g 200 mL/hr over 30 Minutes Intravenous On call to O.R. 10/27/20 0755 10/27/20 1608   10/20/20 1415  linezolid (ZYVOX) tablet 600 mg  Status:  Discontinued        600 mg Oral Every 12 hours 10/20/20 1326 10/28/20 1115   10/19/20 1445  metroNIDAZOLE (FLAGYL) tablet 500 mg  Status:  Discontinued        500 mg Oral Every 12 hours 10/19/20 1347 10/28/20 1115   10/18/20 1600  ceFEPIme (MAXIPIME) 2 g in sodium chloride 0.9 % 100 mL IVPB  Status:  Discontinued        2 g 200 mL/hr over 30 Minutes Intravenous Every 12 hours 10/18/20 0414 10/28/20 1112   10/18/20 1000  dapsone tablet 100 mg        100 mg Oral Daily 10/18/20 0425     10/18/20 1000  bictegravir-emtricitabine-tenofovir AF (BIKTARVY) 50-200-25 MG per tablet 1 tablet  Status:  Discontinued        1 tablet Oral Daily 10/18/20 0610 11/07/20 1451   10/18/20 0415  metroNIDAZOLE (FLAGYL) IVPB 500 mg  Status:  Discontinued        500 mg 100 mL/hr over 60 Minutes Intravenous Every 8 hours 10/18/20 0404 10/19/20 1347   10/18/20 0415  ceFEPIme (MAXIPIME) 2 g in sodium chloride 0.9 % 100 mL IVPB        2 g 200 mL/hr over 30 Minutes Intravenous  Once 10/18/20 0414 10/18/20 0500   10/18/20 0345  vancomycin (VANCOREADY) IVPB 2000 mg/400 mL        2,000 mg 200 mL/hr over 120 Minutes Intravenous  Once 10/18/20 0335 10/18/20 0608        Objective: Vitals:   11/11/20 2048 11/12/20 0512 11/12/20 0541 11/12/20 0907  BP: 104/64 (!) 145/84 (!) 153/95 (!) 152/84  Pulse: 69 69 69 65  Resp: '18 18 18 18  '$ Temp: 98.6 F (37 C) 98.1 F (36.7 C) 98.3 F (36.8 C) 98.1 F (36.7 C)  TempSrc: Oral Oral Oral Oral  SpO2: 95% 100% 99% 96%  Weight:      Height:        Intake/Output Summary (Last 24 hours) at 11/12/2020 1458 Last data filed at 11/12/2020 [id number] Gross per 24 hour  Intake 13567.2 ml  Output 15675 ml  Net -2107.8 ml    Filed Weights    10/26/20 2221 11/06/20 2046 11/09/20 2129  Weight: 124.8 kg 124.8 kg 124.7 kg    Examination: General exam: Appears comfortable  HEENT: PERRLA, oral mucosa moist, no sclera icterus or thrush Respiratory system: Clear to auscultation. Respiratory effort normal. Cardiovascular system: S1 & S2 heard, regular rate and rhythm Gastrointestinal system: Abdomen soft, non-tender, nondistended. Normal bowel  sounds   Central nervous system: Alert and oriented. No focal neurological deficits. Extremities: No cyanosis, clubbing or edema Skin: lateral part of incision on left stump has dehisced and theres is some blood on the dressing Psychiatry:  Mood & affect appropriate.      Data Reviewed: I have personally reviewed following labs and imaging studies  CBC: Recent Labs  Lab 11/09/20 0856 11/10/20 0800 11/12/20 0244  WBC 11.5*  --  12.2*  HGB 6.6* 8.2* 6.9*  HCT 21.6* 26.0* 21.7*  MCV 88.2  --  88.9  PLT 224  --  [id number]    Basic Metabolic Panel: Recent Labs  Lab 11/08/20 0315 11/09/20 0355 11/10/20 0943 11/11/20 0410 11/12/20 0244  NA 135 135 135 137 137  K 5.0 4.4 3.8 3.7 3.8  CL 98 95* 100 103 104  CO2 '23 27 25 26 25  '$ GLUCOSE 208* 161* 184* 131* 165*  BUN 79* 81* 65* 49* 38*  CREATININE 4.12* 4.33* 3.26* 2.70* 2.36*  CALCIUM 9.6 9.4 8.5* 8.2* 8.2*  PHOS 4.9* 5.6* 4.5 3.7 3.0    GFR: Estimated Creatinine Clearance: 46.9 mL/min (A) (by C-G formula based on SCr of 2.36 mg/dL (H)). Liver Function Tests: Recent Labs  Lab 11/08/20 0315 11/09/20 0355 11/10/20 0943 11/11/20 0410 11/12/20 0244  ALBUMIN 2.6* 2.5* 2.5* 2.2* 2.2*    No results for input(s): LIPASE, AMYLASE in the last 168 hours. No results for input(s): AMMONIA in the last 168 hours. Coagulation Profile: No results for input(s): INR, PROTIME in the last 168 hours. Cardiac Enzymes: No results for input(s): CKTOTAL, CKMB, CKMBINDEX, TROPONINI in the last 168 hours. BNP (last 3 results) No results for  input(s): PROBNP in the last 8760 hours. HbA1C: No results for input(s): HGBA1C in the last 72 hours. CBG: Recent Labs  Lab 11/11/20 1140 11/11/20 1605 11/11/20 2046 11/12/20 0658 11/12/20 1122  GLUCAP 171* 169* 179* 178* 160*    Lipid Profile: No results for input(s): CHOL, HDL, LDLCALC, TRIG, CHOLHDL, LDLDIRECT in the last 72 hours. Thyroid Function Tests: No results for input(s): TSH, T4TOTAL, FREET4, T3FREE, THYROIDAB in the last 72 hours. Anemia Panel: No results for input(s): VITAMINB12, FOLATE, FERRITIN, TIBC, IRON, RETICCTPCT in the last 72 hours. Urine analysis:    Component Value Date/Time   COLORURINE STRAW (A) 10/07/2020 1405   APPEARANCEUR CLEAR 10/07/2020 1405   LABSPEC 1.016 10/07/2020 1405   PHURINE 7.0 10/07/2020 1405   GLUCOSEU >=500 (A) 10/07/2020 1405   HGBUR NEGATIVE 10/07/2020 1405   BILIRUBINUR NEGATIVE 10/07/2020 1405   BILIRUBINUR negative 09/07/2018 1034   KETONESUR NEGATIVE 10/07/2020 1405   PROTEINUR 100 (A) 10/07/2020 1405   UROBILINOGEN 1.0 09/07/2018 1034   UROBILINOGEN 1.0 01/01/2017 1032   NITRITE NEGATIVE 10/07/2020 1405   LEUKOCYTESUR NEGATIVE 10/07/2020 1405   Sepsis Labs: '@LABRCNTIP'$ (procalcitonin:4,lacticidven:4) )No results found for this or any previous visit (from the past 240 hour(s)).       Radiology Studies: No results found.    Scheduled Meds:  allopurinol  100 mg Oral Daily   vitamin C  1,000 mg Oral Daily   aspirin EC  81 mg Oral Daily   atorvastatin  80 mg Oral Daily   Chlorhexidine Gluconate Cloth  6 each Topical Q0600   dapsone  100 mg Oral Daily   dolutegravir  50 mg Oral Daily   doravirine  100 mg Oral Daily   escitalopram  20 mg Oral Daily   finasteride  5 mg Oral Daily   gabapentin  100 mg Oral TID   icosapent Ethyl  2 g Oral BID   insulin aspart  0-15 Units Subcutaneous TID WC   insulin aspart  0-5 Units Subcutaneous QHS   lamiVUDine  300 mg Oral Daily   nutrition supplement (JUVEN)  1 packet Oral  BID BM   oxybutynin  2.5 mg Oral BID   pantoprazole  40 mg Oral Daily   ticagrelor  90 mg Oral BID   Continuous Infusions:  sodium chloride Stopped (11/12/20 0533)     LOS: 25 days      Debbe Odea, MD [facility] Pager: [email or web address] 11/12/2020, 2:58 PM

## 2020-11-12 NOTE — Progress Notes (Signed)
3 Days Post-Op Subjective: Pain controlled.  No nausea or emesis.  Tolerating Foley catheter.  Urine clear on slow drip CBI  Objective: Vital signs in last 24 hours: Temp:  [98.1 F (36.7 C)-99.2 F (37.3 C)] 99 F (37.2 C) (08/07 2127) Pulse Rate:  [65-72] 72 (08/07 2127) Resp:  [16-18] 18 (08/07 2127) BP: (129-181)/(59-95) 129/59 (08/07 2127) SpO2:  [95 %-100 %] 97 % (08/07 2127)  Intake/Output from previous day: 08/06 0701 - 08/07 0700 In: 10149.2 [P.O.:120; I.V.:1029.2] Out: [id number] [Urine:14675] Intake/Output this shift: Total I/O In: -  Out: 450 [Urine:450]  UOP: 28L  Physical Exam:  General: Alert and oriented CV: RRR Lungs: Clear Abdomen: Soft, ND, NT Ext: NT, No erythema  Lab Results: Recent Labs    11/10/20 0800 11/12/20 0244 11/12/20 1537  HGB 8.2* 6.9* 8.0*  HCT 26.0* 21.7* 25.1*   BMET Recent Labs    11/11/20 0410 11/12/20 0244  NA 137 137  K 3.7 3.8  CL 103 104  CO2 26 25  GLUCOSE 131* 165*  BUN 49* 38*  CREATININE 2.70* 2.36*  CALCIUM 8.2* 8.2*     Studies/Results: No results found.  Assessment/Plan: Gross hematuria: S/p clot evacuation, fulguration of prostate on 11/09/2020  - Discontinue CBI. If his urine remains clear the foley can be removed Monday morning   LOS: 25 days   Matt R. Gay MD 11/12/2020, 10:56 PM [facility]  Pager: [phone]

## 2020-11-12 NOTE — Progress Notes (Signed)
[name], male   DOB: [birth year], 55 y.o.   MRN: [name] [facility] Progress Note   Assessment/ Plan:   1. Acute kidney Injury on chronic kidney disease stage IIIb (baseline creatinine about 2.5): Appears to have decent urine output (difficult to estimate with CBI but net -3.3 L overnight) and labs indicate continued improvement of renal function.  His prior work-up was negative for hydronephrosis/acute GN.   With continued renal function recovery, I will request for interventional radiology to discontinue his dialysis catheter at this time. 2.  Bladder outlet obstruction/gross hematuria: Underwent cystoscopy yesterday with evacuation of blood clots from his bladder and fulguration of prostate.  Additional management including CBI per urology. 3.  Left foot osteomyelitis status post BKA: Additional management per Dr. Jess Barters recommendations-plans noted for possible admission to SNF. 4.  Normocytic anemia: Compounded by underlying chronic kidney disease and recent hematuria  Renal function back at baseline, I have ordered for removal of dialysis catheter.  We will sign off at this time and arrange for outpatient nephrology follow-up upon discharge.  Subjective:   Feeling better and glad that kidney function is back to baseline and he does not need anymore dialysis at this time.   Objective:   BP (!) 152/84   Pulse 65   Temp 98.1 F (36.7 C) (Oral)   Resp 18   Ht '5\' 10"'$  (1.778 m)   Wt 124.7 kg   SpO2 96%   BMI 39.45 kg/m   Intake/Output Summary (Last 24 hours) at 11/12/2020 1108 Last data filed at 11/12/2020 [id number] Gross per 24 hour  Intake 13567.2 ml  Output 16875 ml  Net -3307.8 ml   Weight change:   Physical Exam: Gen: Sleeping comfortably in bed, easy to awaken and engage in conversation CVS: Pulse regular rhythm, normal rate, S1 and S2 normal Resp: Clear to auscultation anteriorly bilaterally, no rales/rhonchi.  Right IJ TDC Abd: Soft, obese,  nontender, bowel sounds normal Ext: Status post bilateral below-knee amputations, left lower extremity wound VAC in place  Imaging: No results found.  Labs: BMET Recent Labs  Lab 11/07/20 0527 11/07/20 1237 11/08/20 0315 11/09/20 0355 11/10/20 0943 11/11/20 0410 11/12/20 0244  NA 134* 134* 135 135 135 137 137  K 7.1* 6.1* 5.0 4.4 3.8 3.7 3.8  CL 105 106 98 95* 100 103 104  CO2 20* 19* '23 27 25 26 25  '$ GLUCOSE 192* 185* 208* 161* 184* 131* 165*  BUN 78* 79* 79* 81* 65* 49* 38*  CREATININE 3.80* 4.13* 4.12* 4.33* 3.26* 2.70* 2.36*  CALCIUM 9.4 9.7 9.6 9.4 8.5* 8.2* 8.2*  PHOS 4.7* 4.8* 4.9* 5.6* 4.5 3.7 3.0   CBC Recent Labs  Lab 11/09/20 0856 11/10/20 0800 11/12/20 0244  WBC 11.5*  --  12.2*  HGB 6.6* 8.2* 6.9*  HCT 21.6* 26.0* 21.7*  MCV 88.2  --  88.9  PLT 224  --  200    Medications:     allopurinol  100 mg Oral Daily   vitamin C  1,000 mg Oral Daily   aspirin EC  81 mg Oral Daily   atorvastatin  80 mg Oral Daily   Chlorhexidine Gluconate Cloth  6 each Topical Q0600   dapsone  100 mg Oral Daily   dolutegravir  50 mg Oral Daily   doravirine  100 mg Oral Daily   escitalopram  20 mg Oral Daily   finasteride  5 mg Oral Daily   gabapentin  100 mg Oral TID  icosapent Ethyl  2 g Oral BID   insulin aspart  0-15 Units Subcutaneous TID WC   insulin aspart  0-5 Units Subcutaneous QHS   lamiVUDine  300 mg Oral Daily   nutrition supplement (JUVEN)  1 packet Oral BID BM   oxybutynin  2.5 mg Oral BID   pantoprazole  40 mg Oral Daily   ticagrelor  90 mg Oral BID    Elmarie Shiley, MD 11/12/2020, 11:08 AM

## 2020-11-13 ENCOUNTER — Inpatient Hospital Stay (HOSPITAL_COMMUNITY): Payer: Medicare HMO

## 2020-11-13 ENCOUNTER — Other Ambulatory Visit (HOSPITAL_COMMUNITY): Payer: Self-pay

## 2020-11-13 ENCOUNTER — Encounter: Payer: Self-pay | Admitting: Radiology

## 2020-11-13 DIAGNOSIS — B20 Human immunodeficiency virus [HIV] disease: Secondary | ICD-10-CM | POA: Diagnosis not present

## 2020-11-13 DIAGNOSIS — Z89512 Acquired absence of left leg below knee: Secondary | ICD-10-CM | POA: Diagnosis not present

## 2020-11-13 DIAGNOSIS — N289 Disorder of kidney and ureter, unspecified: Secondary | ICD-10-CM | POA: Diagnosis not present

## 2020-11-13 HISTORY — PX: IR REMOVAL TUN CV CATH W/O FL: IMG2289

## 2020-11-13 LAB — CBC
HCT: 25.7 % — ABNORMAL LOW (ref 39.0–52.0)
Hemoglobin: 8.1 g/dL — ABNORMAL LOW (ref 13.0–17.0)
MCH: 28.3 pg (ref 26.0–34.0)
MCHC: 31.5 g/dL (ref 30.0–36.0)
MCV: 89.9 fL (ref 80.0–100.0)
Platelets: 225 10*3/uL (ref 150–400)
RBC: 2.86 MIL/uL — ABNORMAL LOW (ref 4.22–5.81)
RDW: 14.8 % (ref 11.5–15.5)
WBC: 11.6 10*3/uL — ABNORMAL HIGH (ref 4.0–10.5)
nRBC: 0.2 % (ref 0.0–0.2)

## 2020-11-13 LAB — TYPE AND SCREEN
ABO/RH(D): O POS
Antibody Screen: NEGATIVE
Unit division: 0
Unit division: 0
Unit division: 0

## 2020-11-13 LAB — BPAM RBC
Blood Product Expiration Date: 202209032359
Blood Product Expiration Date: 202209032359
Blood Product Expiration Date: 202209032359
ISSUE DATE / TIME: 202208041523
ISSUE DATE / TIME: 202208050058
ISSUE DATE / TIME: 202208070517
Unit Type and Rh: 5100
Unit Type and Rh: 5100
Unit Type and Rh: 5100

## 2020-11-13 LAB — RENAL FUNCTION PANEL
Albumin: 2.3 g/dL — ABNORMAL LOW (ref 3.5–5.0)
Anion gap: 7 (ref 5–15)
BUN: 25 mg/dL — ABNORMAL HIGH (ref 6–20)
CO2: 23 mmol/L (ref 22–32)
Calcium: 8.2 mg/dL — ABNORMAL LOW (ref 8.9–10.3)
Chloride: 104 mmol/L (ref 98–111)
Creatinine, Ser: 1.93 mg/dL — ABNORMAL HIGH (ref 0.61–1.24)
GFR, Estimated: 40 mL/min — ABNORMAL LOW (ref >60)
Glucose, Bld: 155 mg/dL — ABNORMAL HIGH (ref 70–99)
Phosphorus: 2.9 mg/dL (ref 2.5–4.6)
Potassium: 3.5 mmol/L (ref 3.5–5.1)
Sodium: 134 mmol/L — ABNORMAL LOW (ref 135–145)

## 2020-11-13 LAB — GLUCOSE, CAPILLARY
Glucose-Capillary: 160 mg/dL — ABNORMAL HIGH (ref 70–99)
Glucose-Capillary: 156 mg/dL — ABNORMAL HIGH (ref 70–99)
Glucose-Capillary: 113 mg/dL — ABNORMAL HIGH (ref 70–99)

## 2020-11-13 MED ORDER — OXYCODONE HCL 5 MG PO TABS
5.0000 mg | ORAL_TABLET | Freq: Four times a day (QID) | ORAL | Status: DC
Start: 2020-11-13 — End: 2020-11-16
  Administered 2020-11-13 – 2020-11-15 (×9): 5 mg via ORAL
  Filled 2020-11-13 (×12): qty 1

## 2020-11-13 MED ORDER — CHLORHEXIDINE GLUCONATE 4 % EX LIQD
CUTANEOUS | Status: AC
  Filled 2020-11-13: qty 15

## 2020-11-13 MED ORDER — SENNA 8.6 MG PO TABS
2.0000 | ORAL_TABLET | Freq: Every day | ORAL | Status: DC
  Administered 2020-11-14 – 2020-12-24 (×36): 17.2 mg via ORAL
  Filled 2020-11-13 (×41): qty 2

## 2020-11-13 MED ORDER — LIDOCAINE HCL 1 % IJ SOLN
INTRAMUSCULAR | Status: AC
  Filled 2020-11-13: qty 20

## 2020-11-13 NOTE — Procedures (Signed)
Pre procedural Dx: ESRD Post procedural Dx: Same  Successful removal of tunneled HD catheter. Catheter removed intact   EBL: None No immediate complications.  Please see imaging section of Epic for full dictation.  Jacqualine Mau NP 11/13/2020 12:11 PM

## 2020-11-13 NOTE — Progress Notes (Signed)
PROGRESS NOTE    Keith [name]   [id number]  DOB: 08-Sep-1965  DOA: 10/17/2020 [name], NP   Brief Narrative:  [name] a  55 year old male with past medical history of HIV, chronic kidney disease stage IIIb, type 2 diabetes mellitus, hypertension, hyperlipidemia, coronary artery disease, status post right below knee amputation who was sent to the emergency department by his orthopedic surgeon due to concern for left heel ulcer that has failed outpatient therapy.    MRI confirmed osteomyelitis.  He underwent left transtibial amputation by [name].  PT/OT recommended skilled nursing facility on discharge.  TOC consulted and following for disposition. Nephrology consulted  for worsening kidney function/hyperkalemia.  Underwent  temporary dialysis catheter placement for possible need of dialysis. Suspected to have ATN. Later developed hematuria and required fulguration and CBI along with blood transfusions. Wound dehisced over the weekend and there was significant bleeding  Subjective: He states that Dr Jess Barters PA came to see him and said he would have surgery later this week. He has no complaints.  Assessment & Plan:   Principal Problem:  AKI (acute kidney injury), metabolic acidosis Urinary retention - started on dialysis during this admission but this has stopped as it was felt that he had ATN - renal ultrasound unrevealing - s/p foley placement 8/3 with clots-  urology consulted - 8/4 - cystoscopy with fulguration done and started CBI and Proscar - hematuria improving - Cr  4.33> 3.26> 2.70> 2.36> 1.93 - tunneled dialysis cath and foley cath both removed today  Active Problems: Anemia due to acute blood loss from bladder and left stump - transfuse 2 U PRBC for Hb 6.6 on 8/5 - transfused 1 U PRBC ton 8/6- Hb stable- bleeding resolved   Left heel osteo and abscess  - s/p transtibial amputation on 7/22 by Dr Sharol Given -  dehiscence of wound with bleeding -  have notified orthopedic surgery and they recommended keeping a close eye on the wound- no surgery is recommended per the note  CAD s/p stenting in 12/21 Dyslipidemia - cont Brillinta, ASA, Vascepa and Lipitor  Hyperkalemia - K 7.1 - 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 - treated with Lokelma, sodium bicarb tabs, chlorthalidone, low K diet     HIV disease ([place]) - ID has transitioned Tenofovir to tivicay, doravirine, lamivudine due to tubular dysfunction - on Dapsone as well    Uncontrolled type 2 diabetes mellitus with hyperglycemia ([place]) - cont SSI - Hemoglobin A1C    Component Value Date/Time   HGBA1C 13.9 (H) 08/29/2020 0650      Time spent in minutes: 35 DVT prophylaxis: SCD's Start: 10/27/20 1723 Place and maintain sequential compression device Start: 10/27/20 0559 SCDs Start: 10/18/20 0405 Code Status: full code Family Communication:  Level of Care: Level of care: Telemetry Surgical Disposition Plan:  Status is: Inpatient  Remains inpatient appropriate because:IV treatments appropriate due to intensity of illness or inability to take PO  Dispo:  Patient From:  home  Planned Disposition:  SNF  Medically stable for discharge:  no    Consultants:  Nephrology ID Ortho Procedures:  Dialysis cath BKA Antimicrobials:  Anti-infectives (From admission, onward)    Start     Dose/Rate Route Frequency Ordered Stop   11/11/20 1200  vancomycin (VANCOREADY) IVPB 500 mg/100 mL  Status:  Discontinued        500 mg 100 mL/hr over 60 Minutes Intravenous Every T-Th-Sa (Hemodialysis) 11/09/20 1414 11/11/20 1328   11/10/20 0000  Dolutegravir-lamiVUDine (DOVATO) 50-300 MG TABS        1 tablet Oral Daily 11/10/20 1048     11/10/20 0000  doravirine (PIFELTRO) 100 MG TABS tablet        100 mg Oral Daily 11/10/20 1048     11/08/20 1000  dolutegravir (TIVICAY) tablet 50 mg        50 mg Oral Daily 11/07/20 1451     11/08/20 1000  lamiVUDine (EPIVIR) tablet 300 mg        300 mg Oral Daily  11/07/20 1451     11/08/20 1000  doravirine (PIFELTRO) tablet 100 mg        100 mg Oral Daily 11/07/20 1451     11/02/20 1130  ceFAZolin (ANCEF) IVPB 2g/100 mL premix        2 g 200 mL/hr over 30 Minutes Intravenous To Radiology 11/02/20 1042 11/02/20 1704   10/27/20 1245  ceFAZolin (ANCEF) IVPB 3g/100 mL premix        3 g 200 mL/hr over 30 Minutes Intravenous On call to O.R. 10/27/20 0755 10/27/20 1608   10/20/20 1415  linezolid (ZYVOX) tablet 600 mg  Status:  Discontinued        600 mg Oral Every 12 hours 10/20/20 1326 10/28/20 1115   10/19/20 1445  metroNIDAZOLE (FLAGYL) tablet 500 mg  Status:  Discontinued        500 mg Oral Every 12 hours 10/19/20 1347 10/28/20 1115   10/18/20 1600  ceFEPIme (MAXIPIME) 2 g in sodium chloride 0.9 % 100 mL IVPB  Status:  Discontinued        2 g 200 mL/hr over 30 Minutes Intravenous Every 12 hours 10/18/20 0414 10/28/20 1112   10/18/20 1000  dapsone tablet 100 mg        100 mg Oral Daily 10/18/20 0425     10/18/20 1000  bictegravir-emtricitabine-tenofovir AF (BIKTARVY) 50-200-25 MG per tablet 1 tablet  Status:  Discontinued        1 tablet Oral Daily 10/18/20 0610 11/07/20 1451   10/18/20 0415  metroNIDAZOLE (FLAGYL) IVPB 500 mg  Status:  Discontinued        500 mg 100 mL/hr over 60 Minutes Intravenous Every 8 hours 10/18/20 0404 10/19/20 1347   10/18/20 0415  ceFEPIme (MAXIPIME) 2 g in sodium chloride 0.9 % 100 mL IVPB        2 g 200 mL/hr over 30 Minutes Intravenous  Once 10/18/20 0414 10/18/20 0500   10/18/20 0345  vancomycin (VANCOREADY) IVPB 2000 mg/400 mL        2,000 mg 200 mL/hr over 120 Minutes Intravenous  Once 10/18/20 0335 10/18/20 0608        Objective: Vitals:   11/12/20 1802 11/12/20 2127 11/13/20 0507 11/13/20 0914  BP: (!) 181/83 (!) 129/59 (!) 150/91 (!) 158/96  Pulse: 72 72 79 74  Resp: '16 18 17 18  '$ Temp: 99.2 F (37.3 C) 99 F (37.2 C) 99.2 F (37.3 C) (!) 97.5 F (36.4 C)  TempSrc: Oral Oral Oral Oral  SpO2: 95%  97% 98% 96%  Weight:      Height:        Intake/Output Summary (Last 24 hours) at 11/13/2020 1638 Last data filed at 11/13/2020 1543 Gross per 24 hour  Intake 4715.34 ml  Output 8350 ml  Net -3634.66 ml    Filed Weights   10/26/20 2221 11/06/20 2046 11/09/20 2129  Weight: 124.8 kg 124.8 kg 124.7 kg    Examination: General  exam: Appears comfortable  HEENT: PERRLA, oral mucosa moist, no sclera icterus or thrush Respiratory system: Clear to auscultation. Respiratory effort normal. Cardiovascular system: S1 & S2 heard, regular rate and rhythm Gastrointestinal system: Abdomen soft, non-tender, nondistended. Normal bowel sounds   Central nervous system: Alert and oriented. No focal neurological deficits. Extremities: No cyanosis, clubbing or edema- left bKA with wound dehiscence noted but not bleeding today Skin: No rashes or ulcers Psychiatry:  Mood & affect appropriate.      Data Reviewed: I have personally reviewed following labs and imaging studies  CBC: Recent Labs  Lab 11/09/20 0856 11/10/20 0800 11/12/20 0244 11/12/20 1537 11/13/20 0244  WBC 11.5*  --  12.2* 11.3* 11.6*  HGB 6.6* 8.2* 6.9* 8.0* 8.1*  HCT 21.6* 26.0* 21.7* 25.1* 25.7*  MCV 88.2  --  88.9 90.0 89.9  PLT 224  --  200 216 [id number]    Basic Metabolic Panel: Recent Labs  Lab 11/09/20 0355 11/10/20 0943 11/11/20 0410 11/12/20 0244 11/13/20 0244  NA 135 135 137 137 134*  K 4.4 3.8 3.7 3.8 3.5  CL 95* 100 103 104 104  CO2 '27 25 26 25 23  '$ GLUCOSE 161* 184* 131* 165* 155*  BUN 81* 65* 49* 38* 25*  CREATININE 4.33* 3.26* 2.70* 2.36* 1.93*  CALCIUM 9.4 8.5* 8.2* 8.2* 8.2*  PHOS 5.6* 4.5 3.7 3.0 2.9    GFR: Estimated Creatinine Clearance: 57.3 mL/min (A) (by C-G formula based on SCr of 1.93 mg/dL (H)). Liver Function Tests: Recent Labs  Lab 11/09/20 0355 11/10/20 0943 11/11/20 0410 11/12/20 0244 11/13/20 0244  ALBUMIN 2.5* 2.5* 2.2* 2.2* 2.3*    No results for input(s): LIPASE, AMYLASE in the  last 168 hours. No results for input(s): AMMONIA in the last 168 hours. Coagulation Profile: No results for input(s): INR, PROTIME in the last 168 hours. Cardiac Enzymes: No results for input(s): CKTOTAL, CKMB, CKMBINDEX, TROPONINI in the last 168 hours. BNP (last 3 results) No results for input(s): PROBNP in the last 8760 hours. HbA1C: No results for input(s): HGBA1C in the last 72 hours. CBG: Recent Labs  Lab 11/12/20 1122 11/12/20 1651 11/12/20 2128 11/13/20 0647 11/13/20 1609  GLUCAP 160* 155* 145* 160* 156*    Lipid Profile: No results for input(s): CHOL, HDL, LDLCALC, TRIG, CHOLHDL, LDLDIRECT in the last 72 hours. Thyroid Function Tests: No results for input(s): TSH, T4TOTAL, FREET4, T3FREE, THYROIDAB in the last 72 hours. Anemia Panel: No results for input(s): VITAMINB12, FOLATE, FERRITIN, TIBC, IRON, RETICCTPCT in the last 72 hours. Urine analysis:    Component Value Date/Time   COLORURINE STRAW (A) 10/07/2020 1405   APPEARANCEUR CLEAR 10/07/2020 1405   LABSPEC 1.016 10/07/2020 1405   PHURINE 7.0 10/07/2020 1405   GLUCOSEU >=500 (A) 10/07/2020 1405   HGBUR NEGATIVE 10/07/2020 1405   BILIRUBINUR NEGATIVE 10/07/2020 1405   BILIRUBINUR negative 09/07/2018 1034   KETONESUR NEGATIVE 10/07/2020 1405   PROTEINUR 100 (A) 10/07/2020 1405   UROBILINOGEN 1.0 09/07/2018 1034   UROBILINOGEN 1.0 01/01/2017 1032   NITRITE NEGATIVE 10/07/2020 1405   LEUKOCYTESUR NEGATIVE 10/07/2020 1405   Sepsis Labs: '@LABRCNTIP'$ (procalcitonin:4,lacticidven:4) )No results found for this or any previous visit (from the past 240 hour(s)).       Radiology Studies: IR Removal Tun Cv Cath W/O FL  Result Date: 11/13/2020 Jacqualine Mau, NP     11/13/2020 12:11 PM Pre procedural Dx: ESRD Post procedural Dx: Same Successful removal of tunneled HD catheter. Catheter removed intact EBL: None No immediate complications.  Please see imaging section of Epic for full dictation. Jacqualine Mau NP 11/13/2020 12:11 PM      Scheduled Meds:  allopurinol  100 mg Oral Daily   vitamin C  1,000 mg Oral Daily   aspirin EC  81 mg Oral Daily   atorvastatin  80 mg Oral Daily   chlorhexidine       Chlorhexidine Gluconate Cloth  6 each Topical Q0600   dapsone  100 mg Oral Daily   dolutegravir  50 mg Oral Daily   doravirine  100 mg Oral Daily   escitalopram  20 mg Oral Daily   finasteride  5 mg Oral Daily   gabapentin  100 mg Oral TID   icosapent Ethyl  2 g Oral BID   insulin aspart  0-15 Units Subcutaneous TID WC   insulin aspart  0-5 Units Subcutaneous QHS   lamiVUDine  300 mg Oral Daily   lidocaine       nutrition supplement (JUVEN)  1 packet Oral BID BM   oxybutynin  2.5 mg Oral BID   oxyCODONE  5 mg Oral QID   pantoprazole  40 mg Oral Daily   senna  2 tablet Oral QHS   ticagrelor  90 mg Oral BID   Continuous Infusions:  sodium chloride 100 mL/hr at 11/13/20 0426     LOS: 26 days      Debbe Odea, MD [facility] Pager: [email or web address] 11/13/2020, 4:38 PM

## 2020-11-13 NOTE — Progress Notes (Signed)
Orthopedic Tech Progress Note Patient Details:  Keith [name] [birth year] [name]  Called in order to [employer] for a VIVE PROTOCOL    [name], male   DOB: 1965/04/20, 55 y.o.   MRN: [name]  [name] 11/13/2020, 8:24 AM

## 2020-11-13 NOTE — Progress Notes (Signed)
Patient is over 2 weeks status post transtibial amputation.  Was asked to reconsult today as he had significant bleeding and some wound dehiscence over the weekend.  Patient denies any falls.  Examination of the wound overall well apposed wound edges with a surgical staples in place.  On the lateral side of the incision he has an area of approximately 5 cm that has dehisced no current evidence of bleeding.  Some of the staples that were not functioning were removed.  Cannot probe deeply.  No ascending cellulitis no foul odor   We will order a new shrinker which should be worn against his stump.  This should be changed out daily.  We will continue to observe.  [name] will be back on Thursday and I will have him reevaluate on rounds but no surgery planned for now

## 2020-11-13 NOTE — Plan of Care (Signed)
  Problem: Clinical Measurements: Goal: Ability to avoid or minimize complications of infection will improve Outcome: Progressing   Problem: Education: Goal: Knowledge of General Education information will improve Description: Including pain rating scale, medication(s)/side effects and non-pharmacologic comfort measures Outcome: Progressing   Problem: Pain Managment: Goal: General experience of comfort will improve Outcome: Progressing

## 2020-11-14 ENCOUNTER — Other Ambulatory Visit (HOSPITAL_COMMUNITY): Payer: Self-pay

## 2020-11-14 ENCOUNTER — Telehealth: Payer: Self-pay

## 2020-11-14 ENCOUNTER — Ambulatory Visit: Payer: Medicare HMO | Admitting: Infectious Diseases

## 2020-11-14 DIAGNOSIS — B20 Human immunodeficiency virus [HIV] disease: Secondary | ICD-10-CM | POA: Diagnosis not present

## 2020-11-14 DIAGNOSIS — M86172 Other acute osteomyelitis, left ankle and foot: Secondary | ICD-10-CM | POA: Diagnosis not present

## 2020-11-14 DIAGNOSIS — N179 Acute kidney failure, unspecified: Secondary | ICD-10-CM | POA: Diagnosis not present

## 2020-11-14 LAB — RENAL FUNCTION PANEL
Albumin: 2.1 g/dL — ABNORMAL LOW (ref 3.5–5.0)
Anion gap: 7 (ref 5–15)
BUN: 17 mg/dL (ref 6–20)
CO2: 25 mmol/L (ref 22–32)
Calcium: 8.6 mg/dL — ABNORMAL LOW (ref 8.9–10.3)
Chloride: 104 mmol/L (ref 98–111)
Creatinine, Ser: 2.05 mg/dL — ABNORMAL HIGH (ref 0.61–1.24)
GFR, Estimated: 38 mL/min — ABNORMAL LOW (ref >60)
Glucose, Bld: 171 mg/dL — ABNORMAL HIGH (ref 70–99)
Phosphorus: 3.5 mg/dL (ref 2.5–4.6)
Potassium: 4 mmol/L (ref 3.5–5.1)
Sodium: 136 mmol/L (ref 135–145)

## 2020-11-14 LAB — GLUCOSE, CAPILLARY
Glucose-Capillary: 159 mg/dL — ABNORMAL HIGH (ref 70–99)
Glucose-Capillary: 124 mg/dL — ABNORMAL HIGH (ref 70–99)
Glucose-Capillary: 223 mg/dL — ABNORMAL HIGH (ref 70–99)
Glucose-Capillary: 147 mg/dL — ABNORMAL HIGH (ref 70–99)

## 2020-11-14 LAB — CBC
HCT: 24.6 % — ABNORMAL LOW (ref 39.0–52.0)
HCT: 20.8 % — ABNORMAL LOW (ref 39.0–52.0)
Hemoglobin: 7.6 g/dL — ABNORMAL LOW (ref 13.0–17.0)
Hemoglobin: 6.5 g/dL — CL (ref 13.0–17.0)
MCH: 28.7 pg (ref 26.0–34.0)
MCH: 28.5 pg (ref 26.0–34.0)
MCHC: 30.9 g/dL (ref 30.0–36.0)
MCHC: 31.3 g/dL (ref 30.0–36.0)
MCV: 92.8 fL (ref 80.0–100.0)
MCV: 91.2 fL (ref 80.0–100.0)
Platelets: 244 10*3/uL (ref 150–400)
Platelets: 236 10*3/uL (ref 150–400)
RBC: 2.65 MIL/uL — ABNORMAL LOW (ref 4.22–5.81)
RBC: 2.28 MIL/uL — ABNORMAL LOW (ref 4.22–5.81)
RDW: 15.1 % (ref 11.5–15.5)
RDW: 15.1 % (ref 11.5–15.5)
WBC: 10.2 10*3/uL (ref 4.0–10.5)
WBC: 14.8 10*3/uL — ABNORMAL HIGH (ref 4.0–10.5)
nRBC: 0.2 % (ref 0.0–0.2)
nRBC: 0 % (ref 0.0–0.2)

## 2020-11-14 LAB — PREPARE RBC (CROSSMATCH)

## 2020-11-14 MED ORDER — SODIUM CHLORIDE 0.9 % IV BOLUS
500.0000 mL | Freq: Once | INTRAVENOUS | Status: AC
  Administered 2020-11-14: 500 mL via INTRAVENOUS

## 2020-11-14 MED ORDER — SODIUM CHLORIDE 0.9% IV SOLUTION: Freq: Once | INTRAVENOUS | Status: AC

## 2020-11-14 NOTE — Progress Notes (Signed)
Large pool of blood noted on the bed. Patient left stump saturated with blood. Reinforced dressing. Bleeding stopped Patient feeling bad. MD is aware. Bolus of 500 cc of NS infusing at present. Will continue to monitor.

## 2020-11-14 NOTE — Progress Notes (Signed)
PROGRESS NOTE    Keith [name]   [id number]  DOB: 20-May-1965  DOA: 10/17/2020 [name], NP   Brief Narrative:  Keith [name] a  55 year old male with past medical history of HIV, chronic kidney disease stage IIIb, type 2 diabetes mellitus, hypertension, hyperlipidemia, coronary artery disease, status post right below knee amputation who was sent to the emergency department by his orthopedic surgeon due to concern for left heel ulcer that has failed outpatient therapy.    MRI confirmed osteomyelitis.  He underwent left transtibial amputation by [name].  PT/OT recommended skilled nursing facility on discharge.  TOC consulted and following for disposition. Nephrology consulted  for worsening kidney function/hyperkalemia.  Underwent  temporary dialysis catheter placement for possible need of dialysis. Suspected to have ATN. Later developed hematuria and required fulguration and CBI along with blood transfusions. Wound dehisced over the weekend and there was significant bleeding which abated but then has recurred again today.   Subjective: Wound has bled again and there is blood all over his sheets. I have asked him to not lay in a way that puts pressure on the wound.   Assessment & Plan:   Principal Problem:  AKI (acute kidney injury), metabolic acidosis Urinary retention - started on dialysis during this admission but this has stopped as it was felt that he had ATN - renal ultrasound unrevealing - s/p foley placement 8/3 with clots-  urology consulted - 8/4 - cystoscopy with fulguration done and started CBI and Proscar - hematuria improving - Cr  4.33> 3.26> 2.70> 2.36> 1.93 - tunneled dialysis cath and foley cath both removed 8/8 - Cr 2.05 today, likely due to blood loss - follow  Active Problems: Anemia due to acute blood loss from bladder and left stump - transfuse 2 U PRBC for Hb 6.6 on 8/5 - transfused 1 U PRBC on 8/6- Hb stablized- bleeding resolved but  then recurred this AM- Hb now 6.5 - transfusing 2 more U PRBC   Left heel osteo and abscess  - s/p transtibial amputation on 7/22 by Dr Sharol Given -  dehiscence of wound with bleeding - have notified orthopedic surgery and they recommended keeping a close eye on the wound- no surgery is recommended per the note - 8/9> Dr Jess Barters PA states he will come by on Thursday to look at wound  CAD s/p stenting in 12/21 Dyslipidemia - cont Brillinta, ASA, Vascepa and Lipitor  Hyperkalemia - K 7.1 - 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 - treated with Lokelma, sodium bicarb tabs, chlorthalidone, low K diet     HIV disease ([place]) - ID has transitioned Tenofovir to tivicay, doravirine, lamivudine due to tubular dysfunction - on Dapsone as well    Uncontrolled type 2 diabetes mellitus with hyperglycemia (HCC) - cont SSI - Hemoglobin A1C    Component Value Date/Time   HGBA1C 13.9 (H) 08/29/2020 0650      Time spent in minutes: 35 DVT prophylaxis: SCD's Start: 10/27/20 1723 Place and maintain sequential compression device Start: 10/27/20 0559 SCDs Start: 10/18/20 0405 Code Status: full code Family Communication:  Level of Care: Level of care: Telemetry Surgical Disposition Plan:  Status is: Inpatient  Remains inpatient appropriate because:IV treatments appropriate due to intensity of illness or inability to take PO and Inpatient level of care appropriate due to severity of illness  Dispo:  Patient From:  home  Planned Disposition:  SNF  Medically stable for discharge:  no    Consultants:  Nephrology ID  Ortho Procedures:  Dialysis cath L BKA Antimicrobials:  Anti-infectives (From admission, onward)    Start     Dose/Rate Route Frequency Ordered Stop   11/11/20 1200  vancomycin (VANCOREADY) IVPB 500 mg/100 mL  Status:  Discontinued        500 mg 100 mL/hr over 60 Minutes Intravenous Every T-Th-Sa (Hemodialysis) 11/09/20 1414 11/11/20 1328   11/10/20 0000  Dolutegravir-lamiVUDine (DOVATO)  50-300 MG TABS        1 tablet Oral Daily 11/10/20 1048     11/10/20 0000  doravirine (PIFELTRO) 100 MG TABS tablet        100 mg Oral Daily 11/10/20 1048     11/08/20 1000  dolutegravir (TIVICAY) tablet 50 mg        50 mg Oral Daily 11/07/20 1451     11/08/20 1000  lamiVUDine (EPIVIR) tablet 300 mg        300 mg Oral Daily 11/07/20 1451     11/08/20 1000  doravirine (PIFELTRO) tablet 100 mg        100 mg Oral Daily 11/07/20 1451     11/02/20 1130  ceFAZolin (ANCEF) IVPB 2g/100 mL premix        2 g 200 mL/hr over 30 Minutes Intravenous To Radiology 11/02/20 1042 11/02/20 1704   10/27/20 1245  ceFAZolin (ANCEF) IVPB 3g/100 mL premix        3 g 200 mL/hr over 30 Minutes Intravenous On call to O.R. 10/27/20 0755 10/27/20 1608   10/20/20 1415  linezolid (ZYVOX) tablet 600 mg  Status:  Discontinued        600 mg Oral Every 12 hours 10/20/20 1326 10/28/20 1115   10/19/20 1445  metroNIDAZOLE (FLAGYL) tablet 500 mg  Status:  Discontinued        500 mg Oral Every 12 hours 10/19/20 1347 10/28/20 1115   10/18/20 1600  ceFEPIme (MAXIPIME) 2 g in sodium chloride 0.9 % 100 mL IVPB  Status:  Discontinued        2 g 200 mL/hr over 30 Minutes Intravenous Every 12 hours 10/18/20 0414 10/28/20 1112   10/18/20 1000  dapsone tablet 100 mg        100 mg Oral Daily 10/18/20 0425     10/18/20 1000  bictegravir-emtricitabine-tenofovir AF (BIKTARVY) 50-200-25 MG per tablet 1 tablet  Status:  Discontinued        1 tablet Oral Daily 10/18/20 0610 11/07/20 1451   10/18/20 0415  metroNIDAZOLE (FLAGYL) IVPB 500 mg  Status:  Discontinued        500 mg 100 mL/hr over 60 Minutes Intravenous Every 8 hours 10/18/20 0404 10/19/20 1347   10/18/20 0415  ceFEPIme (MAXIPIME) 2 g in sodium chloride 0.9 % 100 mL IVPB        2 g 200 mL/hr over 30 Minutes Intravenous  Once 10/18/20 0414 10/18/20 0500   10/18/20 0345  vancomycin (VANCOREADY) IVPB 2000 mg/400 mL        2,000 mg 200 mL/hr over 120 Minutes Intravenous  Once  10/18/20 0335 10/18/20 0608        Objective: Vitals:   11/14/20 0550 11/14/20 0634 11/14/20 0809 11/14/20 0904  BP: (!) 92/58 90/60 109/75 103/72  Pulse: 84 85  86  Resp: '18 20  18  '$ Temp:  99.1 F (37.3 C)  99.2 F (37.3 C)  TempSrc:  Oral  Oral  SpO2:  96%  96%  Weight:      Height:  Intake/Output Summary (Last 24 hours) at 11/14/2020 1313 Last data filed at 11/14/2020 0600 Gross per 24 hour  Intake 5904.5 ml  Output 5600 ml  Net 304.5 ml    Filed Weights   10/26/20 2221 11/06/20 2046 11/09/20 2129  Weight: 124.8 kg 124.8 kg 124.7 kg    Examination: General exam: Appears comfortable  HEENT: PERRLA, oral mucosa moist, no sclera icterus or thrush Respiratory system: Clear to auscultation. Respiratory effort normal. Cardiovascular system: S1 & S2 heard, regular rate and rhythm Gastrointestinal system: Abdomen soft, non-tender, nondistended. Normal bowel sounds   Central nervous system: Alert and oriented. No focal neurological deficits. Extremities: No cyanosis, clubbing left BKA has a fresh dressing and sheets are filled with blood Skin: No rashes or ulcers Psychiatry:  Mood & affect appropriate.      Data Reviewed: I have personally reviewed following labs and imaging studies  CBC: Recent Labs  Lab 11/12/20 0244 11/12/20 1537 11/13/20 0244 11/14/20 0429 11/14/20 1104  WBC 12.2* 11.3* 11.6* 10.2 14.8*  HGB 6.9* 8.0* 8.1* 7.6* 6.5*  HCT 21.7* 25.1* 25.7* 24.6* 20.8*  MCV 88.9 90.0 89.9 92.8 91.2  PLT 200 216 225 244 [id number]    Basic Metabolic Panel: Recent Labs  Lab 11/10/20 0943 11/11/20 0410 11/12/20 0244 11/13/20 0244 11/14/20 0429  NA 135 137 137 134* 136  K 3.8 3.7 3.8 3.5 4.0  CL 100 103 104 104 104  CO2 '25 26 25 23 25  '$ GLUCOSE 184* 131* 165* 155* 171*  BUN 65* 49* 38* 25* 17  CREATININE 3.26* 2.70* 2.36* 1.93* 2.05*  CALCIUM 8.5* 8.2* 8.2* 8.2* 8.6*  PHOS 4.5 3.7 3.0 2.9 3.5    GFR: Estimated Creatinine Clearance: 54 mL/min (A)  (by C-G formula based on SCr of 2.05 mg/dL (H)). Liver Function Tests: Recent Labs  Lab 11/10/20 0943 11/11/20 0410 11/12/20 0244 11/13/20 0244 11/14/20 0429  ALBUMIN 2.5* 2.2* 2.2* 2.3* 2.1*    No results for input(s): LIPASE, AMYLASE in the last 168 hours. No results for input(s): AMMONIA in the last 168 hours. Coagulation Profile: No results for input(s): INR, PROTIME in the last 168 hours. Cardiac Enzymes: No results for input(s): CKTOTAL, CKMB, CKMBINDEX, TROPONINI in the last 168 hours. BNP (last 3 results) No results for input(s): PROBNP in the last 8760 hours. HbA1C: No results for input(s): HGBA1C in the last 72 hours. CBG: Recent Labs  Lab 11/13/20 0647 11/13/20 1609 11/13/20 2130 11/14/20 0630 11/14/20 1208  GLUCAP 160* 156* 113* 159* 124*    Lipid Profile: No results for input(s): CHOL, HDL, LDLCALC, TRIG, CHOLHDL, LDLDIRECT in the last 72 hours. Thyroid Function Tests: No results for input(s): TSH, T4TOTAL, FREET4, T3FREE, THYROIDAB in the last 72 hours. Anemia Panel: No results for input(s): VITAMINB12, FOLATE, FERRITIN, TIBC, IRON, RETICCTPCT in the last 72 hours. Urine analysis:    Component Value Date/Time   COLORURINE STRAW (A) 10/07/2020 1405   APPEARANCEUR CLEAR 10/07/2020 1405   LABSPEC 1.016 10/07/2020 1405   PHURINE 7.0 10/07/2020 1405   GLUCOSEU >=500 (A) 10/07/2020 1405   HGBUR NEGATIVE 10/07/2020 1405   BILIRUBINUR NEGATIVE 10/07/2020 1405   BILIRUBINUR negative 09/07/2018 1034   KETONESUR NEGATIVE 10/07/2020 1405   PROTEINUR 100 (A) 10/07/2020 1405   UROBILINOGEN 1.0 09/07/2018 1034   UROBILINOGEN 1.0 01/01/2017 1032   NITRITE NEGATIVE 10/07/2020 1405   LEUKOCYTESUR NEGATIVE 10/07/2020 1405   Sepsis Labs: '@LABRCNTIP'$ (procalcitonin:4,lacticidven:4) )No results found for this or any previous visit (from the past 240 hour(s)).  Radiology Studies: IR Removal Tun Cv Cath W/O FL  Result Date: 11/13/2020 INDICATION: Patient  with history of acute on chronic kidney injury status post hemodialysis catheter placement. Now in renal recovery. Request is for hemodialysis catheter removal EXAM: REMOVAL TUNNELED CENTRAL VENOUS CATHETER MEDICATIONS: No antibiotic was indicated for this procedure. ANESTHESIA/SEDATION: Moderate (conscious) sedation was not employed during this procedure. FLUOROSCOPY TIME:  Fluoroscopy Time: none COMPLICATIONS: None immediate. PROCEDURE: Informed written consent was obtained from the patient after a thorough discussion of the procedural risks, benefits and alternatives. All questions were addressed. Maximal Sterile Barrier Technique was utilized including caps, mask, sterile gowns, sterile gloves, sterile drape, hand hygiene and skin antiseptic. A timeout was performed prior to the initiation of the procedure. The patient's right chest and catheter was prepped and draped in a normal sterile fashion. Heparin was removed from both ports of catheter. 1% lidocaine was used for local anesthesia. Using gentle blunt dissection the cuff of the catheter was exposed and the catheter was removed in it's entirety. Pressure was held till hemostasis was obtained. A sterile dressing was applied. The patient tolerated the procedure well with no immediate complications. IMPRESSION: Successful catheter removal as described above. Read by: Rushie Nyhan, NP Electronically Signed   By: Corrie Mckusick D.O.   On: 11/13/2020 12:13      Scheduled Meds:  sodium chloride   Intravenous Once   allopurinol  100 mg Oral Daily   vitamin C  1,000 mg Oral Daily   aspirin EC  81 mg Oral Daily   atorvastatin  80 mg Oral Daily   Chlorhexidine Gluconate Cloth  6 each Topical Q0600   dapsone  100 mg Oral Daily   dolutegravir  50 mg Oral Daily   doravirine  100 mg Oral Daily   escitalopram  20 mg Oral Daily   finasteride  5 mg Oral Daily   gabapentin  100 mg Oral TID   icosapent Ethyl  2 g Oral BID   insulin aspart  0-15 Units  Subcutaneous TID WC   insulin aspart  0-5 Units Subcutaneous QHS   lamiVUDine  300 mg Oral Daily   nutrition supplement (JUVEN)  1 packet Oral BID BM   oxybutynin  2.5 mg Oral BID   oxyCODONE  5 mg Oral QID   pantoprazole  40 mg Oral Daily   senna  2 tablet Oral QHS   ticagrelor  90 mg Oral BID   Continuous Infusions:    LOS: 27 days      Debbe Odea, MD [facility] Pager: [email or web address] 11/14/2020, 1:13 PM

## 2020-11-14 NOTE — Plan of Care (Signed)
  Problem: Clinical Measurements: Goal: Ability to avoid or minimize complications of infection will improve Outcome: Progressing   Problem: Skin Integrity: Goal: Skin integrity will improve Outcome: Progressing   Problem: Clinical Measurements: Goal: Ability to maintain clinical measurements within normal limits will improve Outcome: Progressing Goal: Will remain free from infection Outcome: Progressing Goal: Diagnostic test results will improve Outcome: Progressing Goal: Cardiovascular complication will be avoided Outcome: Progressing   Problem: Activity: Goal: Risk for activity intolerance will decrease Outcome: Progressing   Problem: Nutrition: Goal: Adequate nutrition will be maintained Outcome: Progressing   Problem: Coping: Goal: Level of anxiety will decrease Outcome: Progressing   Problem: Elimination: Goal: Will not experience complications related to bowel motility Outcome: Progressing Goal: Will not experience complications related to urinary retention Outcome: Progressing   Problem: Pain Managment: Goal: General experience of comfort will improve Outcome: Progressing   Problem: Safety: Goal: Ability to remain free from injury will improve Outcome: Progressing   Problem: Skin Integrity: Goal: Risk for impaired skin integrity will decrease Outcome: Progressing

## 2020-11-14 NOTE — Telephone Encounter (Signed)
Received a message for a call back concerning patient.  CB# [phone].  No name was given with the message.

## 2020-11-14 NOTE — Progress Notes (Signed)
Refused to get cleaned up. "I am still weak." Will try later.

## 2020-11-14 NOTE — TOC Progression Note (Addendum)
Transition of Care [facility]) - Progression Note    Patient Details  Name: Keith [name] MRN: [name] Date of Birth: December 17, 1965  [facility] CM/SW Contact  Sharlet Salina Mila Homer, LCSW Phone Number: 11/14/2020, 4:35 PM  Clinical Narrative:  Patient currently has no bed offers. Was faxed out to [facility] and surrounding counties. CSW sent initial referral today to [facility], [place] [facility], [place] and [place]. CSW will continue to follow and continue search for a skilled facility that will accept patient.     Expected Discharge Plan: Skilled Nursing Facility Barriers to Discharge: SNF Pending bed offer  Expected Discharge Plan and Services Expected Discharge Plan: [place] In-house Referral: Clinical Social Work Discharge Planning Services: CM Consult Post Acute Care Choice: [place] arrangements for the past 2 months: Single Family Home                                       Social Determinants of Health (SDOH) Interventions    Readmission Risk Interventions Readmission Risk Prevention Plan 10/21/2020 11/13/2018 11/13/2018  Transportation Screening Complete - Complete  PCP or Specialist Appt within 3-5 Days - - -  HRI or [place] Work Consult for [place] - - -  Medication Review Press photographer) Complete - Complete  PCP or Specialist appointment within 3-5 days of discharge Complete Complete -  [place] or Home Care Consult Complete Complete -  SW Recovery Care/Counseling Consult Complete Complete -  Palliative Care Screening Not Applicable Not Applicable -  [place] Complete Not Applicable -  Some encounter information is confidential and restricted. Go to Review Flowsheets activity to see all data.  Some recent data might be hidden

## 2020-11-14 NOTE — Progress Notes (Signed)
   11/14/20 0440  Vitals  Temp 97.7 F (36.5 C)  Temp Source Oral  BP (!) 78/54  MAP (mmHg) (!) 63  BP Location Left Arm  BP Method Automatic  Patient Position (if appropriate) Lying  Pulse Rate 86  Pulse Rate Source Monitor  Resp 18  Level of Consciousness  Level of Consciousness Alert  MEWS COLOR  MEWS Score Color Yellow  Oxygen Therapy  SpO2 93 %  O2 Device Room Air  Pain Assessment  Pain Scale 0-10  Pain Score 0  MEWS Score  MEWS Temp 0  MEWS Systolic 2  MEWS Pulse 0  MEWS RR 0  MEWS LOC 0  MEWS Score 2  Provider Notification  Provider Name/Title DR [facility]  Date Provider Notified 11/14/20  Time Provider Notified 0451  Notification Type Page  Notification Reason Change in status

## 2020-11-14 NOTE — Progress Notes (Signed)
Physical Therapy Treatment Patient Details Name: Keith [name] MRN: [name] DOB: 11/25/1965 Today's Date: 11/14/2020    History of Present Illness Keith [name] is a 55 y.o. male  who is sent to the ED by his orthopedic surgery provider due to concern for left heel ulcer that had failed outpatient therapy. with medical history significant for HIV, CKD 3B, type 2 diabetes, hypertension, hyperlipidemia, coronary artery disease, status post right below the knee amputation, now s/p L BKA 10/27/20.    PT Comments    Pt Hgb is low today, however pt reports feeling a little better. Pt refused mobility other than to pull himself up in the bed so the bed can be placed in chair position. Pt agrees to limited LE exercise. Educated pt on need to eat his lunch especially the chicken because of the protein needed for healing. Pt left in chair position eating. D/c plan remains appropriate. PT will continue to follow acutely.     Follow Up Recommendations  SNF     Equipment Recommendations  Wheelchair (measurements PT);Wheelchair cushion (measurements PT)       Precautions / Restrictions Precautions Precautions: Fall Precaution Comments: B BKA, prosthesis for Right Required Braces or Orthoses: Other Brace Other Brace: limb protector on left Restrictions Weight Bearing Restrictions: Yes RLE Weight Bearing: Non weight bearing LLE Weight Bearing: Non weight bearing    Mobility  Bed Mobility Overal bed mobility: Needs Assistance             General bed mobility comments: declined mobility but able to assist therapist in pulling up to HoB, then bed placed in chair position for exercise and eating lunch            Cognition Arousal/Alertness: Awake/alert Behavior During Therapy: Flat affect Overall Cognitive Status: No family/caregiver present to determine baseline cognitive functioning Area of Impairment: Safety/judgement                       Following Commands: Follows  multi-step commands with increased time;Follows one step commands consistently Safety/Judgement: Decreased awareness of safety   Problem Solving: Requires verbal cues;Requires tactile cues General Comments: pt is less lethargic today and more participatory      Exercises Amputee Exercises Hip ABduction/ADduction: AAROM;5 reps;Seated;Left;Right;AROM Hip Flexion/Marching: AAROM;Left;Seated;Right;AROM    General Comments General comments (skin integrity, edema, etc.): Hgb 6.5 and scheduled for 2 Units PRBC, VSS on RA,      Pertinent Vitals/Pain Pain Assessment: Faces Faces Pain Scale: Hurts a little bit Pain Location: L LE Pain Descriptors / Indicators: Discomfort;Sore Pain Intervention(s): Limited activity within patient's tolerance;Monitored during session;Repositioned     PT Goals (current goals can now be found in the care plan section) Acute Rehab PT Goals Patient Stated Goal: get better PT Goal Formulation: With patient Time For Goal Achievement: 11/10/20 Potential to Achieve Goals: Fair    Frequency    Min 3X/week      PT Plan Current plan remains appropriate       AM-PAC PT "6 Clicks" Mobility   Outcome Measure  Help needed turning from your back to your side while in a flat bed without using bedrails?: A Little Help needed moving from lying on your back to sitting on the side of a flat bed without using bedrails?: Total Help needed moving to and from a bed to a chair (including a wheelchair)?: Total Help needed standing up from a chair using your arms (e.g., wheelchair or bedside chair)?: Total Help needed  to walk in hospital room?: Total Help needed climbing 3-5 steps with a railing? : Total 6 Click Score: 8    End of Session   Activity Tolerance: Patient limited by lethargy Patient left: with call bell/phone within reach;with bed alarm set;in bed Nurse Communication: Mobility status PT Visit Diagnosis: Unsteadiness on feet (R26.81);Other  abnormalities of gait and mobility (R26.89);Pain;Muscle weakness (generalized) (M62.81) Pain - Right/Left: Left Pain - part of body: Leg     Time: 1345-1356 PT Time Calculation (min) (ACUTE ONLY): 11 min  Charges:  $Therapeutic Exercise: 8-22 mins                     Milah Recht B. Migdalia Dk PT, DPT Acute Rehabilitation Services Pager [phone] Office [phone]    [place] 11/14/2020, 2:13 PM

## 2020-11-14 NOTE — Progress Notes (Signed)
Occupational Therapy Treatment Patient Details Name: Keith [name] MRN: [name] DOB: 1965-11-29 Today's Date: 11/14/2020    History of present illness Keith [name] is a 55 y.o. male  who is sent to the ED by his orthopedic surgery provider due to concern for left heel ulcer that had failed outpatient therapy. with medical history significant for HIV, CKD 3B, type 2 diabetes, hypertension, hyperlipidemia, coronary artery disease, status post right below the knee amputation, now s/p L BKA 10/27/20.   OT comments  Pt with limited progress towards acute OT goals this session. Limited by Hgb 6.5, awaiting 2 units. Focus of session was inititating HEP for BUE strength. Encouraged breathing exercises and staying upright in the bed as much as possible today. D/c plan remains appropriate.   Follow Up Recommendations  SNF    Equipment Recommendations  None recommended by OT    Recommendations for Other Services      Precautions / Restrictions Precautions Precautions: Fall Precaution Comments: B BKA, prosthesis for Right Required Braces or Orthoses: Other Brace Other Brace: limb protector on left Restrictions Weight Bearing Restrictions: Yes RLE Weight Bearing: Non weight bearing LLE Weight Bearing: Non weight bearing       Mobility Bed Mobility Overal bed mobility: Needs Assistance             General bed mobility comments: declined mobility more than scooting up in bed.    Transfers                      Balance                                           ADL either performed or assessed with clinical judgement   ADL                                               Vision       Perception     Praxis      Cognition Arousal/Alertness: Awake/alert ("tired") Behavior During Therapy: Flat affect Overall Cognitive Status: No family/caregiver present to determine baseline cognitive functioning Area of Impairment:  Safety/judgement                       Following Commands: Follows multi-step commands with increased time;Follows one step commands consistently Safety/Judgement: Decreased awareness of safety   Problem Solving: Requires verbal cues;Requires tactile cues General Comments: pt is less lethargic today and more participatory        Exercises Exercises: Other exercises Other Exercises: level 2 theraband HEP initiated. Also provided hand held squeeze ball. Other Exercises: Education on breathing exercises and trying to by upright in the bed today as much as possible   Shoulder Instructions       General Comments Hgb 6.5 and scheduled for 2 Units PRBC, VSS on RA,    Pertinent Vitals/ Pain       Pain Assessment: Faces Faces Pain Scale: Hurts a little bit Pain Location: L LE Pain Descriptors / Indicators: Discomfort;Sore Pain Intervention(s): Monitored during session;Limited activity within patient's tolerance  Home Living  Prior Functioning/Environment              Frequency  Min 2X/week        Progress Toward Goals  OT Goals(current goals can now be found in the care plan section)  Progress towards OT goals: Progressing toward goals (limited this session d/t Hgb 6.5)  Acute Rehab OT Goals Patient Stated Goal: get better OT Goal Formulation: With patient Time For Goal Achievement: 11/24/20 Potential to Achieve Goals: Good ADL Goals Pt Will Perform Lower Body Bathing: with min assist;sitting/lateral leans Pt Will Perform Upper Body Dressing: with set-up;sitting Pt Will Perform Lower Body Dressing: with min assist;sitting/lateral leans Pt Will Transfer to Toilet: with min assist;stand pivot transfer Pt Will Perform Toileting - Clothing Manipulation and hygiene: with set-up;sitting/lateral leans  Plan Discharge plan remains appropriate    Co-evaluation                 AM-PAC OT "6 Clicks"  Daily Activity     Outcome Measure   Help from another person eating meals?: None Help from another person taking care of personal grooming?: A Little Help from another person toileting, which includes using toliet, bedpan, or urinal?: Total Help from another person bathing (including washing, rinsing, drying)?: A Lot Help from another person to put on and taking off regular upper body clothing?: A Little Help from another person to put on and taking off regular lower body clothing?: A Lot 6 Click Score: 15    End of Session    OT Visit Diagnosis: Other abnormalities of gait and mobility (R26.89);Muscle weakness (generalized) (M62.81);Pain Pain - Right/Left: Left Pain - part of body: Leg   Activity Tolerance Patient limited by fatigue   Patient Left in bed;with call bell/phone within reach;with bed alarm set   Nurse Communication          Time: [id number] OT Time Calculation (min): 8 min  Charges: OT General Charges $OT Visit: 1 Visit OT Treatments $Therapeutic Exercise: 8-22 mins  Tyrone Schimke, OT Acute Rehabilitation Services Pager: [phone] Office: [phone]    Hortencia Pilar 11/14/2020, 2:29 PM

## 2020-11-14 NOTE — Plan of Care (Signed)
  Problem: Education: Goal: Knowledge of General Education information will improve Description: Including pain rating scale, medication(s)/side effects and non-pharmacologic comfort measures Outcome: Completed/Met   Problem: Health Behavior/Discharge Planning: Goal: Ability to manage health-related needs will improve Outcome: Completed/Met

## 2020-11-15 DIAGNOSIS — N179 Acute kidney failure, unspecified: Secondary | ICD-10-CM | POA: Diagnosis not present

## 2020-11-15 DIAGNOSIS — B20 Human immunodeficiency virus [HIV] disease: Secondary | ICD-10-CM | POA: Diagnosis not present

## 2020-11-15 DIAGNOSIS — M86172 Other acute osteomyelitis, left ankle and foot: Secondary | ICD-10-CM | POA: Diagnosis not present

## 2020-11-15 LAB — CBC
HCT: 23.6 % — ABNORMAL LOW (ref 39.0–52.0)
Hemoglobin: 7.6 g/dL — ABNORMAL LOW (ref 13.0–17.0)
MCH: 28.9 pg (ref 26.0–34.0)
MCHC: 32.2 g/dL (ref 30.0–36.0)
MCV: 89.7 fL (ref 80.0–100.0)
Platelets: 230 10*3/uL (ref 150–400)
RBC: 2.63 MIL/uL — ABNORMAL LOW (ref 4.22–5.81)
RDW: 14.9 % (ref 11.5–15.5)
WBC: 11 10*3/uL — ABNORMAL HIGH (ref 4.0–10.5)
nRBC: 0 % (ref 0.0–0.2)

## 2020-11-15 LAB — RENAL FUNCTION PANEL
Albumin: 2.1 g/dL — ABNORMAL LOW (ref 3.5–5.0)
Anion gap: 7 (ref 5–15)
BUN: 25 mg/dL — ABNORMAL HIGH (ref 6–20)
CO2: 23 mmol/L (ref 22–32)
Calcium: 7.9 mg/dL — ABNORMAL LOW (ref 8.9–10.3)
Chloride: 104 mmol/L (ref 98–111)
Creatinine, Ser: 3.15 mg/dL — ABNORMAL HIGH (ref 0.61–1.24)
GFR, Estimated: 22 mL/min — ABNORMAL LOW (ref >60)
Glucose, Bld: 142 mg/dL — ABNORMAL HIGH (ref 70–99)
Phosphorus: 3.9 mg/dL (ref 2.5–4.6)
Potassium: 3.6 mmol/L (ref 3.5–5.1)
Sodium: 134 mmol/L — ABNORMAL LOW (ref 135–145)

## 2020-11-15 LAB — GLUCOSE, CAPILLARY
Glucose-Capillary: 155 mg/dL — ABNORMAL HIGH (ref 70–99)
Glucose-Capillary: 196 mg/dL — ABNORMAL HIGH (ref 70–99)
Glucose-Capillary: 140 mg/dL — ABNORMAL HIGH (ref 70–99)
Glucose-Capillary: 178 mg/dL — ABNORMAL HIGH (ref 70–99)

## 2020-11-15 MED ORDER — SODIUM CHLORIDE 0.9 % IV SOLN: INTRAVENOUS | Status: DC

## 2020-11-15 NOTE — Progress Notes (Signed)
[name], male   DOB: 12-May-1965, 54 y.o.   MRN: [name]  PROGRESS NOTE    Keith [name]  [id number] DOB: [birth year] DOA: 10/17/2020 [name], NP   Brief Narrative:   55 year old male with past medical history of HIV, chronic kidney disease stage IIIb, type 2 diabetes mellitus, hypertension, hyperlipidemia, coronary artery disease, status post right below knee amputation who was sent to the emergency department by his orthopedic surgeon due to concern for left heel ulcer that had failed outpatient therapy.  Subsequently, MRI confirmed osteomyelitis.  He underwent left transtibial amputation by [name]. During the hospitalization, he had worsening renal function.  Nephrology was consulted.  He had temporary dialysis catheter placed and dialysis was started.  Later, he developed hematuria and required fulguration and CBI along with blood transfusions.  Renal function improved; dialysis was stopped and nephrology signed off. Subsequently, his wound dehisced with bleeding requiring blood transfusion.  Assessment & Plan:   Acute kidney injury, metabolic acidosis Urinary retention -Patient was started on dialysis during this admission but was subsequently stopped and nephrology signed off.  AKI was felt to be due to ATN.  Tunneled dialysis catheter has already been removed -Renal ultrasound was negative for hydronephrosis -Status post Foley catheter placement on 11/08/2020 with clots: Urology was consulted.  Underwent cystoscopy with fulguration on 11/09/2020 and started on CBI and Proscar.  Subsequently CBI was discontinued -Creatinine was improving but has worsened over the last 2 days.  Creatinine 2.05 on 11/14/2020 and worsened to 3.15 today.  We will give gentle hydration and repeat a.m. creatinine.  Will not remove Foley catheter today -Hematuria improving  Anemia due to acute blood loss from bladder and left stump -Hemoglobin 6.5 on 11/14/2020 for which patient was  transfused 2 more units packed red cells.  Hemoglobin 7.6 this morning.  Patient has had multiple units of packed red cell transfusion during this hospitalization. -Monitor H&H.  Left heel osteomyelitis and abscess status post transtibial amputation on 10/27/2020 with subsequent dehiscence of wound with bleeding -Patient has had significant bleeding from the wound with wound dehiscence requiring blood transfusions. -Orthopedics following.  [name] to decide if patient needs further surgical intervention.  CAD status post stenting in 03/2020 Dyslipidemia -Currently still on aspirin and Brilinta.  Continue Vascepa and Lipitor  Hyperkalemia -Resolved  HIV -Continue current regimen of antiretrovirals and dapsone as per ID.  Outpatient follow-up with ID  Diabetes mellitus type 2 with hyperglycemia -Continue to visit with SSI.  A1c 13.9 on 08/29/2020.  DVT prophylaxis: SCDs Code Status: Full Family Communication: None at bedside Disposition Plan: Status is: Inpatient  Remains inpatient appropriate because:Inpatient level of care appropriate due to severity of illness  Dispo:  Patient From: Home  Planned Disposition: SNF  Medically stable for discharge: No   Consultants: Nephrology/ID/urology/Ortho  Procedures: Dialysis cath placement and subsequent removal; left transtibial amputation  Antimicrobials:  Anti-infectives (From admission, onward)    Start     Dose/Rate Route Frequency Ordered Stop   11/11/20 1200  vancomycin (VANCOREADY) IVPB 500 mg/100 mL  Status:  Discontinued        500 mg 100 mL/hr over 60 Minutes Intravenous Every T-Th-Sa (Hemodialysis) 11/09/20 1414 11/11/20 1328   11/10/20 0000  Dolutegravir-lamiVUDine (DOVATO) 50-300 MG TABS        1 tablet Oral Daily 11/10/20 1048     11/10/20 0000  doravirine (PIFELTRO) 100 MG TABS tablet        100  mg Oral Daily 11/10/20 1048     11/08/20 1000  dolutegravir (TIVICAY) tablet 50 mg        50 mg Oral Daily 11/07/20 1451      11/08/20 1000  lamiVUDine (EPIVIR) tablet 300 mg        300 mg Oral Daily 11/07/20 1451     11/08/20 1000  doravirine (PIFELTRO) tablet 100 mg        100 mg Oral Daily 11/07/20 1451     11/02/20 1130  ceFAZolin (ANCEF) IVPB 2g/100 mL premix        2 g 200 mL/hr over 30 Minutes Intravenous To Radiology 11/02/20 1042 11/02/20 1704   10/27/20 1245  ceFAZolin (ANCEF) IVPB 3g/100 mL premix        3 g 200 mL/hr over 30 Minutes Intravenous On call to O.R. 10/27/20 0755 10/27/20 1608   10/20/20 1415  linezolid (ZYVOX) tablet 600 mg  Status:  Discontinued        600 mg Oral Every 12 hours 10/20/20 1326 10/28/20 1115   10/19/20 1445  metroNIDAZOLE (FLAGYL) tablet 500 mg  Status:  Discontinued        500 mg Oral Every 12 hours 10/19/20 1347 10/28/20 1115   10/18/20 1600  ceFEPIme (MAXIPIME) 2 g in sodium chloride 0.9 % 100 mL IVPB  Status:  Discontinued        2 g 200 mL/hr over 30 Minutes Intravenous Every 12 hours 10/18/20 0414 10/28/20 1112   10/18/20 1000  dapsone tablet 100 mg        100 mg Oral Daily 10/18/20 0425     10/18/20 1000  bictegravir-emtricitabine-tenofovir AF (BIKTARVY) 50-200-25 MG per tablet 1 tablet  Status:  Discontinued        1 tablet Oral Daily 10/18/20 0610 11/07/20 1451   10/18/20 0415  metroNIDAZOLE (FLAGYL) IVPB 500 mg  Status:  Discontinued        500 mg 100 mL/hr over 60 Minutes Intravenous Every 8 hours 10/18/20 0404 10/19/20 1347   10/18/20 0415  ceFEPIme (MAXIPIME) 2 g in sodium chloride 0.9 % 100 mL IVPB        2 g 200 mL/hr over 30 Minutes Intravenous  Once 10/18/20 0414 10/18/20 0500   10/18/20 0345  vancomycin (VANCOREADY) IVPB 2000 mg/400 mL        2,000 mg 200 mL/hr over 120 Minutes Intravenous  Once 10/18/20 0335 10/18/20 [id number]         Subjective: Patient seen and examined at bedside.  No overnight fever, vomiting or worsening shortness of breath reported.  Objective: Vitals:   11/14/20 2255 11/15/20 0145 11/15/20 0536 11/15/20 0846  BP:  125/86 138/78 127/67 119/73  Pulse: 80 70 71 71  Resp: '20 20 18 18  '$ Temp: 98.6 F (37 C) 98.4 F (36.9 C) 98.2 F (36.8 C) 98.2 F (36.8 C)  TempSrc: Oral Oral Oral   SpO2: 100% 100% 98% 100%  Weight:      Height:        Intake/Output Summary (Last 24 hours) at 11/15/2020 1058 Last data filed at 11/15/2020 0817 Gross per 24 hour  Intake 1216 ml  Output 450 ml  Net 766 ml   Filed Weights   10/26/20 2221 11/06/20 2046 11/09/20 2129  Weight: 124.8 kg 124.8 kg 124.7 kg    Examination:  General exam: Appears calm and comfortable.  Looks chronically ill and deconditioned.  Currently on room air Respiratory system: Bilateral decreased breath sounds at bases  with scattered crackles Cardiovascular system: S1 & S2 heard, Rate controlled Gastrointestinal system: Abdomen is nondistended, soft and nontender. Normal bowel sounds heard. Extremities: No cyanosis, clubbing; left BKA with dressing present Central nervous system: Alert and oriented.  Slow to respond.  Poor historian.  No focal neurological deficits. Moving extremities Skin: No obvious ecchymosis/other lesions Psychiatry: Mostly flat affect.   Data Reviewed: I have personally reviewed following labs and imaging studies  CBC: Recent Labs  Lab 11/12/20 1537 11/13/20 0244 11/14/20 0429 11/14/20 1104 11/15/20 0557  WBC 11.3* 11.6* 10.2 14.8* 11.0*  HGB 8.0* 8.1* 7.6* 6.5* 7.6*  HCT 25.1* 25.7* 24.6* 20.8* 23.6*  MCV 90.0 89.9 92.8 91.2 89.7  PLT 216 225 244 236 [id number]   Basic Metabolic Panel: Recent Labs  Lab 11/11/20 0410 11/12/20 0244 11/13/20 0244 11/14/20 0429 11/15/20 0557  NA 137 137 134* 136 134*  K 3.7 3.8 3.5 4.0 3.6  CL 103 104 104 104 104  CO2 '26 25 23 25 23  '$ GLUCOSE 131* 165* 155* 171* 142*  BUN 49* 38* 25* 17 25*  CREATININE 2.70* 2.36* 1.93* 2.05* 3.15*  CALCIUM 8.2* 8.2* 8.2* 8.6* 7.9*  PHOS 3.7 3.0 2.9 3.5 3.9   GFR: Estimated Creatinine Clearance: 35.1 mL/min (A) (by C-G formula based on  SCr of 3.15 mg/dL (H)). Liver Function Tests: Recent Labs  Lab 11/11/20 0410 11/12/20 0244 11/13/20 0244 11/14/20 0429 11/15/20 0557  ALBUMIN 2.2* 2.2* 2.3* 2.1* 2.1*   No results for input(s): LIPASE, AMYLASE in the last 168 hours. No results for input(s): AMMONIA in the last 168 hours. Coagulation Profile: No results for input(s): INR, PROTIME in the last 168 hours. Cardiac Enzymes: No results for input(s): CKTOTAL, CKMB, CKMBINDEX, TROPONINI in the last 168 hours. BNP (last 3 results) No results for input(s): PROBNP in the last 8760 hours. HbA1C: No results for input(s): HGBA1C in the last 72 hours. CBG: Recent Labs  Lab 11/14/20 0630 11/14/20 1208 11/14/20 1622 11/14/20 2053 11/15/20 0631  GLUCAP 159* 124* 223* 147* 155*   Lipid Profile: No results for input(s): CHOL, HDL, LDLCALC, TRIG, CHOLHDL, LDLDIRECT in the last 72 hours. Thyroid Function Tests: No results for input(s): TSH, T4TOTAL, FREET4, T3FREE, THYROIDAB in the last 72 hours. Anemia Panel: No results for input(s): VITAMINB12, FOLATE, FERRITIN, TIBC, IRON, RETICCTPCT in the last 72 hours. Sepsis Labs: No results for input(s): PROCALCITON, LATICACIDVEN in the last 168 hours.  No results found for this or any previous visit (from the past 240 hour(s)).       Radiology Studies: IR Removal Tun Cv Cath W/O FL  Result Date: 11/13/2020 INDICATION: Patient with history of acute on chronic kidney injury status post hemodialysis catheter placement. Now in renal recovery. Request is for hemodialysis catheter removal EXAM: REMOVAL TUNNELED CENTRAL VENOUS CATHETER MEDICATIONS: No antibiotic was indicated for this procedure. ANESTHESIA/SEDATION: Moderate (conscious) sedation was not employed during this procedure. FLUOROSCOPY TIME:  Fluoroscopy Time: none COMPLICATIONS: None immediate. PROCEDURE: Informed written consent was obtained from the patient after a thorough discussion of the procedural risks, benefits and  alternatives. All questions were addressed. Maximal Sterile Barrier Technique was utilized including caps, mask, sterile gowns, sterile gloves, sterile drape, hand hygiene and skin antiseptic. A timeout was performed prior to the initiation of the procedure. The patient's right chest and catheter was prepped and draped in a normal sterile fashion. Heparin was removed from both ports of catheter. 1% lidocaine was used for local anesthesia. Using gentle blunt dissection the cuff of  the catheter was exposed and the catheter was removed in it's entirety. Pressure was held till hemostasis was obtained. A sterile dressing was applied. The patient tolerated the procedure well with no immediate complications. IMPRESSION: Successful catheter removal as described above. Read by: Rushie Nyhan, NP Electronically Signed   By: Corrie Mckusick D.O.   On: 11/13/2020 12:13        Scheduled Meds:  allopurinol  100 mg Oral Daily   vitamin C  1,000 mg Oral Daily   aspirin EC  81 mg Oral Daily   atorvastatin  80 mg Oral Daily   Chlorhexidine Gluconate Cloth  6 each Topical Q0600   dapsone  100 mg Oral Daily   dolutegravir  50 mg Oral Daily   doravirine  100 mg Oral Daily   escitalopram  20 mg Oral Daily   finasteride  5 mg Oral Daily   gabapentin  100 mg Oral TID   icosapent Ethyl  2 g Oral BID   insulin aspart  0-15 Units Subcutaneous TID WC   insulin aspart  0-5 Units Subcutaneous QHS   lamiVUDine  300 mg Oral Daily   nutrition supplement (JUVEN)  1 packet Oral BID BM   oxybutynin  2.5 mg Oral BID   oxyCODONE  5 mg Oral QID   pantoprazole  40 mg Oral Daily   senna  2 tablet Oral QHS   ticagrelor  90 mg Oral BID   Continuous Infusions:        Aline August, MD [facility] 11/15/2020, 10:58 AM

## 2020-11-15 NOTE — Progress Notes (Signed)
Patient is status post below-knee amputation.  Has had 2 episodes of significant bleeding and some wound dehiscence from the lateral side of the stump.  Did have 2 units of blood yesterday for hemoglobin of 6.5.  Current hemoglobin is 7.6   Vital signs stable he is alert and awake dressing was taken down he has a significant amount of dried blood with a small amount of new bloody drainage but no significant active bleeding.  There is no signs of cellulitis or infection he does continue to have a persistent wound dehiscence on the lateral side.  New compression dressing was applied   [name] will return tomorrow.  Will evaluate to see if further surgical intervention is needed.  If so this would be on Friday

## 2020-11-16 ENCOUNTER — Other Ambulatory Visit: Payer: Self-pay | Admitting: Physician Assistant

## 2020-11-16 DIAGNOSIS — N179 Acute kidney failure, unspecified: Secondary | ICD-10-CM | POA: Diagnosis not present

## 2020-11-16 DIAGNOSIS — B20 Human immunodeficiency virus [HIV] disease: Secondary | ICD-10-CM | POA: Diagnosis not present

## 2020-11-16 LAB — RENAL FUNCTION PANEL
Albumin: 2.2 g/dL — ABNORMAL LOW (ref 3.5–5.0)
Anion gap: 7 (ref 5–15)
BUN: 24 mg/dL — ABNORMAL HIGH (ref 6–20)
CO2: 21 mmol/L — ABNORMAL LOW (ref 22–32)
Calcium: 8.1 mg/dL — ABNORMAL LOW (ref 8.9–10.3)
Chloride: 108 mmol/L (ref 98–111)
Creatinine, Ser: 2.5 mg/dL — ABNORMAL HIGH (ref 0.61–1.24)
GFR, Estimated: 30 mL/min — ABNORMAL LOW (ref >60)
Glucose, Bld: 153 mg/dL — ABNORMAL HIGH (ref 70–99)
Phosphorus: 3.7 mg/dL (ref 2.5–4.6)
Potassium: 4.2 mmol/L (ref 3.5–5.1)
Sodium: 136 mmol/L (ref 135–145)

## 2020-11-16 LAB — CBC WITH DIFFERENTIAL/PLATELET
Abs Immature Granulocytes: 0.07 10*3/uL (ref 0.00–0.07)
Basophils Absolute: 0 10*3/uL (ref 0.0–0.1)
Basophils Relative: 0 %
Eosinophils Absolute: 0.4 10*3/uL (ref 0.0–0.5)
Eosinophils Relative: 4 %
HCT: 35.8 % — ABNORMAL LOW (ref 39.0–52.0)
Hemoglobin: 11.6 g/dL — ABNORMAL LOW (ref 13.0–17.0)
Immature Granulocytes: 1 %
Lymphocytes Relative: 18 %
Lymphs Abs: 1.6 10*3/uL (ref 0.7–4.0)
MCH: 28.9 pg (ref 26.0–34.0)
MCHC: 32.4 g/dL (ref 30.0–36.0)
MCV: 89.3 fL (ref 80.0–100.0)
Monocytes Absolute: 0.6 10*3/uL (ref 0.1–1.0)
Monocytes Relative: 7 %
Neutro Abs: 6.3 10*3/uL (ref 1.7–7.7)
Neutrophils Relative %: 70 %
Platelets: 200 10*3/uL (ref 150–400)
RBC: 4.01 MIL/uL — ABNORMAL LOW (ref 4.22–5.81)
RDW: 14.8 % (ref 11.5–15.5)
WBC: 9.1 10*3/uL (ref 4.0–10.5)
nRBC: 0 % (ref 0.0–0.2)

## 2020-11-16 LAB — GLUCOSE, CAPILLARY
Glucose-Capillary: 153 mg/dL — ABNORMAL HIGH (ref 70–99)
Glucose-Capillary: 142 mg/dL — ABNORMAL HIGH (ref 70–99)
Glucose-Capillary: 125 mg/dL — ABNORMAL HIGH (ref 70–99)
Glucose-Capillary: 117 mg/dL — ABNORMAL HIGH (ref 70–99)

## 2020-11-16 LAB — MAGNESIUM: Magnesium: 1.6 mg/dL — ABNORMAL LOW (ref 1.7–2.4)

## 2020-11-16 MED ORDER — OXYCODONE HCL 5 MG PO TABS
10.0000 mg | ORAL_TABLET | Freq: Four times a day (QID) | ORAL | Status: DC
  Administered 2020-11-16 – 2020-12-27 (×134): 10 mg via ORAL
  Filled 2020-11-16 (×150): qty 2

## 2020-11-16 MED ORDER — HYDROMORPHONE HCL 1 MG/ML IJ SOLN
0.5000 mg | INTRAMUSCULAR | Status: DC | PRN
  Administered 2020-11-16 – 2020-11-17 (×5): 1 mg via INTRAVENOUS
  Filled 2020-11-16 (×5): qty 1

## 2020-11-16 MED ORDER — ENSURE PRE-SURGERY PO LIQD
296.0000 mL | Freq: Once | ORAL | Status: AC
  Administered 2020-11-17: 296 mL via ORAL
  Filled 2020-11-16 (×2): qty 296

## 2020-11-16 MED ORDER — CEFAZOLIN IN SODIUM CHLORIDE 3-0.9 GM/100ML-% IV SOLN
3.0000 g | INTRAVENOUS | Status: AC
  Administered 2020-11-17: 3 g via INTRAVENOUS
  Filled 2020-11-16 (×2): qty 100

## 2020-11-16 NOTE — TOC Progression Note (Addendum)
Transition of Care [facility]) - Progression Note    Patient Details  Name: COLEN RENEE MRN: [id number] Date of Birth: 12/06/1965  Transition of Care [facility]) CM/SW [place], [place] Phone Number: 11/16/2020, 3:54 PM  Clinical Narrative:     CSW continuing to seek SNF placement. Main barrier is limited medicaid bed availability.   CSW is also informed that [facility] has accept pt in the past but he currently owes a balance of around 3000 dollars and they would not be able to accept unless the balance was paid.   Expected Discharge Plan: Skilled Nursing Facility Barriers to Discharge: SNF Pending bed offer  Expected Discharge Plan and Services Expected Discharge Plan: [place] In-house Referral: Clinical Social Work Discharge Planning Services: CM Consult Post Acute Care Choice: [place] arrangements for the past 2 months: Single Family Home                                       Social Determinants of Health (SDOH) Interventions    Readmission Risk Interventions Readmission Risk Prevention Plan 10/21/2020 11/13/2018 11/13/2018  Transportation Screening Complete - Complete  PCP or Specialist Appt within 3-5 Days - - -  HRI or [place] Work Consult for [place] - - -  Medication Review [employer] photographer) Complete - Complete  PCP or Specialist appointment within 3-5 days of discharge Complete Complete -  [place] or Home Care Consult Complete Complete -  SW Recovery Care/Counseling Consult Complete Complete -  Palliative Care Screening Not Applicable Not Applicable -  [place] Complete Not Applicable -  Some encounter information is confidential and restricted. Go to Review Flowsheets activity to see all data.  Some recent data might be hidden

## 2020-11-16 NOTE — Progress Notes (Signed)
Physical Therapy Treatment Patient Details Name: Keith [name] MRN: [name] DOB: 11-08-1965 Today's Date: 11/16/2020    History of Present Illness Keith [name] is a 55 y.o. male  who is sent to the ED by his orthopedic surgery provider due to concern for left heel ulcer that had failed outpatient therapy. with medical history significant for HIV, CKD 3B, type 2 diabetes, hypertension, hyperlipidemia, coronary artery disease, status post right below the knee amputation, now s/p L BKA 10/27/20.    PT Comments    Pt supine in bed in dark on entry, reports he has just had pain medication and is feeling "high". Initial plan wheelchair training however given pt with less stability due to IV Dilaudid, changed to transfer to recliner and push outside since he has not been out of the hospital for over a month. Pt very flat reports he just got news of a friend passing away, and also obviously upset about surgery tomorrow. Reluctantly pt able to don R LE prosthetic with min A and transfer to recliner with min guard assist. Pt with limited command follow but functionally able to transfer to recliner. Pt rolled outside with no change in mood. Requesting to return to room after a few minutes. Pt refused offer for chaplain visit. PT will reEvaluate mobility s/p amputation revision 8/12.     Follow Up Recommendations  SNF     Equipment Recommendations  Wheelchair (measurements PT);Wheelchair cushion (measurements PT)       Precautions / Restrictions Precautions Precautions: Fall;Other (comment) (Wound Vac L LE) Precaution Comments: B BKA, prosthesis for Right, bladder irrigation Restrictions Other Position/Activity Restrictions: NWB on left    Mobility  Bed Mobility Overal bed mobility: Modified Independent             General bed mobility comments: HOB elevated, increased time and effort to come to EoB    Transfers Overall transfer level: Needs assistance   Transfers: Lateral/Scoot  Transfers          Lateral/Scoot Transfers: Min guard;+2 safety/equipment General transfer comment: no physical assist needed, however does not follow commands, stating "why do I have to do it your way.", able to scoot from bed to chair with use of prosthetic limb with close min guard for safety due leaning forward and utilizing both UE on recliner to pull into chair         Balance Overall balance assessment: Needs assistance Sitting-balance support: Bilateral upper extremity supported;Feet unsupported Sitting balance-Leahy Scale: Fair                                      Cognition Arousal/Alertness: Awake/alert Behavior During Therapy: Flat affect (sad) Overall Cognitive Status: No family/caregiver present to determine baseline cognitive functioning Area of Impairment: Safety/judgement                       Following Commands: Follows multi-step commands with increased time;Follows one step commands consistently Safety/Judgement: Decreased awareness of safety   Problem Solving: Requires verbal cues;Requires tactile cues General Comments: reports that he just found out one of his friends has passed away and has poor understanding of why he needs surgery tomorrow to revise his L LE amputation         General Comments General comments (skin integrity, edema, etc.): Able to don R LE prosthetic with min A to prepare for transfer Pt reluctantly agreeable to  transport outside for fresh air      Pertinent Vitals/Pain Pain Assessment: Faces Faces Pain Scale: Hurts a little bit Pain Location: L LE with transfer Pain Descriptors / Indicators: Discomfort Pain Intervention(s): Monitored during session;Premedicated before session;Limited activity within patient's tolerance;Repositioned     PT Goals (current goals can now be found in the care plan section) Acute Rehab PT Goals Patient Stated Goal: to get left prosthetic, to walk again PT Goal Formulation:  With patient Time For Goal Achievement: 11/10/20 Potential to Achieve Goals: Fair Progress towards PT goals: Progressing toward goals    Frequency    Min 3X/week      PT Plan Current plan remains appropriate    Co-evaluation PT/OT/SLP Co-Evaluation/Treatment: Yes Reason for Co-Treatment: Complexity of the patient's impairments (multi-system involvement);For patient/therapist safety;To address functional/ADL transfers          AM-PAC PT "6 Clicks" Mobility   Outcome Measure  Help needed turning from your back to your side while in a flat bed without using bedrails?: None Help needed moving from lying on your back to sitting on the side of a flat bed without using bedrails?: A Little Help needed moving to and from a bed to a chair (including a wheelchair)?: A Little Help needed standing up from a chair using your arms (e.g., wheelchair or bedside chair)?: A Lot Help needed to walk in hospital room?: Total Help needed climbing 3-5 steps with a railing? : Total 6 Click Score: 14    End of Session   Activity Tolerance: Other (comment);Patient tolerated treatment well (fatigued from donning prosthetic) Patient left: with call bell/phone within reach;in chair;with chair alarm set Nurse Communication: Mobility status PT Visit Diagnosis: Unsteadiness on feet (R26.81);Other abnormalities of gait and mobility (R26.89);Pain;Muscle weakness (generalized) (M62.81) Pain - Right/Left: Left Pain - part of body: Leg     Time: [id number] PT Time Calculation (min) (ACUTE ONLY): 33 min  Charges:  $Therapeutic Activity: 8-22 mins                     Oleta Gunnoe B. Migdalia Dk PT, DPT Acute Rehabilitation Services Pager [phone] Office [phone]    [place] 11/16/2020, 2:07 PM

## 2020-11-16 NOTE — Progress Notes (Signed)
[name], male   DOB: [birth year], 55 y.o.   MRN: [name]  PROGRESS NOTE    Keith [name]  [id number] DOB: 07/21/1965 DOA: 10/17/2020 [name], NP   Brief Narrative:   55 year old male with past medical history of HIV, chronic kidney disease stage IIIb, type 2 diabetes mellitus, hypertension, hyperlipidemia, coronary artery disease, status post right below knee amputation who was sent to the emergency department by his orthopedic surgeon due to concern for left heel ulcer that had failed outpatient therapy.  Subsequently, MRI confirmed osteomyelitis.  He underwent left transtibial amputation by [name]. During the hospitalization, he had worsening renal function.  Nephrology was consulted.  He had temporary dialysis catheter placed and dialysis was started.  Later, he developed hematuria and required fulguration and CBI along with blood transfusions.  Renal function improved; dialysis was stopped and nephrology signed off. Subsequently, his wound dehisced with bleeding requiring blood transfusion.  Assessment & Plan:   Acute kidney injury, metabolic acidosis Urinary retention -Patient was started on dialysis during this admission but was subsequently stopped and nephrology signed off.  AKI was felt to be due to ATN.  Tunneled dialysis catheter has already been removed -Renal ultrasound was negative for hydronephrosis -Status post Foley catheter placement on 11/08/2020 with clots: Urology was consulted.  Underwent cystoscopy with fulguration on 11/09/2020 and started on CBI and Proscar.  Subsequently CBI was discontinued -Creatinine was improving but has worsened over the last 2 days.  Creatinine 2.05 on 11/14/2020 and worsened to 3.15 on 11/15/2020.  Creatinine improving to 2.5 today.  Continue gentle hydration and repeat a.m. creatinine. -Hematuria improving  Anemia due to acute blood loss from bladder and left stump -Hemoglobin 6.5 on 11/14/2020 for which patient  was transfused 2 more units packed red cells.  Hemoglobin 11.6 this morning.  Patient has had multiple units of packed red cell transfusion during this hospitalization. -Monitor H&H.  Left heel osteomyelitis and abscess status post transtibial amputation on 10/27/2020 with subsequent dehiscence of wound with bleeding -Patient has had significant bleeding from the wound with wound dehiscence requiring blood transfusions. -Orthopedics following.  [name] to decide if patient needs further surgical intervention.  CAD status post stenting in 03/2020 Dyslipidemia -Currently still on aspirin and Brilinta.  Continue Vascepa and Lipitor  Hyperkalemia -Resolved  HIV -Continue current regimen of antiretrovirals and dapsone as per ID.  Outpatient follow-up with ID  Diabetes mellitus type 2 with hyperglycemia -Continue to visit with SSI.  A1c 13.9 on 08/29/2020.  DVT prophylaxis: SCDs Code Status: Full Family Communication: None at bedside Disposition Plan: Status is: Inpatient  Remains inpatient appropriate because:Inpatient level of care appropriate due to severity of illness  Dispo:  Patient From: Home  Planned Disposition: SNF  Medically stable for discharge: No   Consultants: Nephrology/ID/urology/Ortho  Procedures: Dialysis cath placement and subsequent removal; left transtibial amputation  Antimicrobials:  Anti-infectives (From admission, onward)    Start     Dose/Rate Route Frequency Ordered Stop   11/11/20 1200  vancomycin (VANCOREADY) IVPB 500 mg/100 mL  Status:  Discontinued        500 mg 100 mL/hr over 60 Minutes Intravenous Every T-Th-Sa (Hemodialysis) 11/09/20 1414 11/11/20 1328   11/10/20 0000  Dolutegravir-lamiVUDine (DOVATO) 50-300 MG TABS        1 tablet Oral Daily 11/10/20 1048     11/10/20 0000  doravirine (PIFELTRO) 100 MG TABS tablet        100 mg Oral  Daily 11/10/20 1048     11/08/20 1000  dolutegravir (TIVICAY) tablet 50 mg        50 mg Oral Daily 11/07/20  1451     11/08/20 1000  lamiVUDine (EPIVIR) tablet 300 mg        300 mg Oral Daily 11/07/20 1451     11/08/20 1000  doravirine (PIFELTRO) tablet 100 mg        100 mg Oral Daily 11/07/20 1451     11/02/20 1130  ceFAZolin (ANCEF) IVPB 2g/100 mL premix        2 g 200 mL/hr over 30 Minutes Intravenous To Radiology 11/02/20 1042 11/02/20 1704   10/27/20 1245  ceFAZolin (ANCEF) IVPB 3g/100 mL premix        3 g 200 mL/hr over 30 Minutes Intravenous On call to O.R. 10/27/20 0755 10/27/20 1608   10/20/20 1415  linezolid (ZYVOX) tablet 600 mg  Status:  Discontinued        600 mg Oral Every 12 hours 10/20/20 1326 10/28/20 1115   10/19/20 1445  metroNIDAZOLE (FLAGYL) tablet 500 mg  Status:  Discontinued        500 mg Oral Every 12 hours 10/19/20 1347 10/28/20 1115   10/18/20 1600  ceFEPIme (MAXIPIME) 2 g in sodium chloride 0.9 % 100 mL IVPB  Status:  Discontinued        2 g 200 mL/hr over 30 Minutes Intravenous Every 12 hours 10/18/20 0414 10/28/20 1112   10/18/20 1000  dapsone tablet 100 mg        100 mg Oral Daily 10/18/20 0425     10/18/20 1000  bictegravir-emtricitabine-tenofovir AF (BIKTARVY) 50-200-25 MG per tablet 1 tablet  Status:  Discontinued        1 tablet Oral Daily 10/18/20 0610 11/07/20 1451   10/18/20 0415  metroNIDAZOLE (FLAGYL) IVPB 500 mg  Status:  Discontinued        500 mg 100 mL/hr over 60 Minutes Intravenous Every 8 hours 10/18/20 0404 10/19/20 1347   10/18/20 0415  ceFEPIme (MAXIPIME) 2 g in sodium chloride 0.9 % 100 mL IVPB        2 g 200 mL/hr over 30 Minutes Intravenous  Once 10/18/20 0414 10/18/20 0500   10/18/20 0345  vancomycin (VANCOREADY) IVPB 2000 mg/400 mL        2,000 mg 200 mL/hr over 120 Minutes Intravenous  Once 10/18/20 0335 10/18/20 [id number]         Subjective: Patient seen and examined at bedside.  Poor historian.  No worsening shortness of breath, vomiting or fever reported.  Objective: Vitals:   11/15/20 0846 11/15/20 1754 11/15/20 2116 11/16/20  0509  BP: 119/73 136/90 (!) 141/84 135/85  Pulse: 71 74 73 69  Resp: '18 17 17 18  '$ Temp: 98.2 F (36.8 C) 98.4 F (36.9 C) 98.4 F (36.9 C) 98.3 F (36.8 C)  TempSrc:      SpO2: 100% 98% 100% 96%  Weight:   124.7 kg   Height:        Intake/Output Summary (Last 24 hours) at 11/16/2020 0820 Last data filed at 11/16/2020 0600 Gross per 24 hour  Intake 2200.9 ml  Output 400 ml  Net 1800.9 ml    Filed Weights   11/06/20 2046 11/09/20 2129 11/15/20 2116  Weight: 124.8 kg 124.7 kg 124.7 kg    Examination:  General exam: No distress.  On room air currently.  Looks chronically ill and deconditioned.  Respiratory system: Decreased breath sounds at  bases bilaterally with some crackles  cardiovascular system: Rate controlled, S1-S2 heard gastrointestinal system: Abdomen is distended slightly, soft and nontender.  Bowel sounds are heard Extremities: Left BKA with dressing present; no clubbing  Central nervous system: Awake but very slow to respond.  Poor historian.  No obvious focal deficits. Skin: No obvious petechiae/other rashes  psychiatry: Very flat affect   Data Reviewed: I have personally reviewed following labs and imaging studies  CBC: Recent Labs  Lab 11/13/20 0244 11/14/20 0429 11/14/20 1104 11/15/20 0557 11/16/20 0320  WBC 11.6* 10.2 14.8* 11.0* 9.1  NEUTROABS  --   --   --   --  6.3  HGB 8.1* 7.6* 6.5* 7.6* 11.6*  HCT 25.7* 24.6* 20.8* 23.6* 35.8*  MCV 89.9 92.8 91.2 89.7 89.3  PLT 225 244 236 230 [id number]    Basic Metabolic Panel: Recent Labs  Lab 11/12/20 0244 11/13/20 0244 11/14/20 0429 11/15/20 0557 11/16/20 0320  NA 137 134* 136 134* 136  K 3.8 3.5 4.0 3.6 4.2  CL 104 104 104 104 108  CO2 '25 23 25 23 '$ 21*  GLUCOSE 165* 155* 171* 142* 153*  BUN 38* 25* 17 25* 24*  CREATININE 2.36* 1.93* 2.05* 3.15* 2.50*  CALCIUM 8.2* 8.2* 8.6* 7.9* 8.1*  MG  --   --   --   --  1.6*  PHOS 3.0 2.9 3.5 3.9 3.7    GFR: Estimated Creatinine Clearance: 44.2 mL/min  (A) (by C-G formula based on SCr of 2.5 mg/dL (H)). Liver Function Tests: Recent Labs  Lab 11/12/20 0244 11/13/20 0244 11/14/20 0429 11/15/20 0557 11/16/20 0320  ALBUMIN 2.2* 2.3* 2.1* 2.1* 2.2*    No results for input(s): LIPASE, AMYLASE in the last 168 hours. No results for input(s): AMMONIA in the last 168 hours. Coagulation Profile: No results for input(s): INR, PROTIME in the last 168 hours. Cardiac Enzymes: No results for input(s): CKTOTAL, CKMB, CKMBINDEX, TROPONINI in the last 168 hours. BNP (last 3 results) No results for input(s): PROBNP in the last 8760 hours. HbA1C: No results for input(s): HGBA1C in the last 72 hours. CBG: Recent Labs  Lab 11/15/20 0631 11/15/20 1141 11/15/20 1645 11/15/20 2117 11/16/20 0642  GLUCAP 155* 196* 140* 178* 153*    Lipid Profile: No results for input(s): CHOL, HDL, LDLCALC, TRIG, CHOLHDL, LDLDIRECT in the last 72 hours. Thyroid Function Tests: No results for input(s): TSH, T4TOTAL, FREET4, T3FREE, THYROIDAB in the last 72 hours. Anemia Panel: No results for input(s): VITAMINB12, FOLATE, FERRITIN, TIBC, IRON, RETICCTPCT in the last 72 hours. Sepsis Labs: No results for input(s): PROCALCITON, LATICACIDVEN in the last 168 hours.  No results found for this or any previous visit (from the past 240 hour(s)).       Radiology Studies: No results found.      Scheduled Meds:  allopurinol  100 mg Oral Daily   vitamin C  1,000 mg Oral Daily   aspirin EC  81 mg Oral Daily   atorvastatin  80 mg Oral Daily   Chlorhexidine Gluconate Cloth  6 each Topical Q0600   dapsone  100 mg Oral Daily   dolutegravir  50 mg Oral Daily   doravirine  100 mg Oral Daily   escitalopram  20 mg Oral Daily   finasteride  5 mg Oral Daily   gabapentin  100 mg Oral TID   icosapent Ethyl  2 g Oral BID   insulin aspart  0-15 Units Subcutaneous TID WC   insulin aspart  0-5 Units  Subcutaneous QHS   lamiVUDine  300 mg Oral Daily   nutrition  supplement (JUVEN)  1 packet Oral BID BM   oxybutynin  2.5 mg Oral BID   oxyCODONE  5 mg Oral QID   pantoprazole  40 mg Oral Daily   senna  2 tablet Oral QHS   ticagrelor  90 mg Oral BID   Continuous Infusions:  sodium chloride 75 mL/hr at 11/16/20 0254          Aline August, MD [facility] 11/16/2020, 8:20 AM

## 2020-11-16 NOTE — Progress Notes (Signed)
Occupational Therapy Treatment Patient Details Name: Keith [name] MRN: [id number] DOB: 1965/06/16 Today's Date: 11/16/2020    History of present illness Keith [name] is a 55 y.o. male  who is sent to the ED by his orthopedic surgery provider due to concern for left heel ulcer that had failed outpatient therapy. with medical history significant for HIV, CKD 3B, type 2 diabetes, hypertension, hyperlipidemia, coronary artery disease, status post right below the knee amputation, now s/p L BKA 10/27/20.   OT comments  Pt progressing towards acute OT goals. Pt verbalized feeling sad today, just received news of a friend's death and struggling to understand his own current medical situation. Worked on functional transfers. Accompanied pt off unit to sit outside for a few minutes before returning to room. Offered chaplain consult, pt politely declined. D/c plan remains appropriate.    Follow Up Recommendations  SNF    Equipment Recommendations  None recommended by OT    Recommendations for Other Services      Precautions / Restrictions Precautions Precautions: Fall Precaution Comments: B BKA, prosthesis for Right, bladder irrigation Required Braces or Orthoses: Other Brace Other Brace: limb protector on left Restrictions Weight Bearing Restrictions: Yes LLE Weight Bearing: Non weight bearing Other Position/Activity Restrictions: NWB on left       Mobility Bed Mobility Overal bed mobility: Modified Independent Bed Mobility: Supine to Sit           General bed mobility comments: HOB elevated, increased time and effort to come to EoB    Transfers Overall transfer level: Needs assistance   Transfers: Lateral/Scoot Transfers          Lateral/Scoot Transfers: Min guard;+2 safety/equipment General transfer comment: no physical assist needed, however does not follow commands, stating "why do I have to do it your way.", able to scoot from bed to chair with use of prosthetic  limb with close min guard for safety due leaning forward and utilizing both UE on recliner to pull into chair    Balance Overall balance assessment: Needs assistance Sitting-balance support: Bilateral upper extremity supported;Feet unsupported Sitting balance-Leahy Scale: Fair Sitting balance - Comments: requires outside assist to maintain balance EoB                                   ADL either performed or assessed with clinical judgement   ADL Overall ADL's : Needs assistance/impaired                                       General ADL Comments: Lateral scoot transfer to recliner. Accompanied pt outside during todays session. Pt able to keep RLE elevated on his own during transport     Vision       Perception     Praxis      Cognition Arousal/Alertness: Awake/alert Behavior During Therapy: Flat affect (sad) Overall Cognitive Status: No family/caregiver present to determine baseline cognitive functioning Area of Impairment: Safety/judgement                       Following Commands: Follows multi-step commands with increased time;Follows one step commands consistently Safety/Judgement: Decreased awareness of safety   Problem Solving: Requires verbal cues;Requires tactile cues General Comments: reports that he just found out one of his friends has passed away and has poor understanding  of why he needs surgery tomorrow to revise his L LE amputation        Exercises     Shoulder Instructions       General Comments Able to don R LE prosthetic with min A to prepare for transfer Pt reluctantly agreeable to transport outside for fresh air    Pertinent Vitals/ Pain       Pain Assessment: Faces Faces Pain Scale: Hurts a little bit Pain Location: L LE with transfer Pain Descriptors / Indicators: Discomfort Pain Intervention(s): Monitored during session;Premedicated before session;Repositioned;Limited activity within patient's  tolerance  Home Living                                          Prior Functioning/Environment              Frequency  Min 2X/week        Progress Toward Goals  OT Goals(current goals can now be found in the care plan section)  Progress towards OT goals: Progressing toward goals  Acute Rehab OT Goals Patient Stated Goal: to get left prosthetic, to walk again OT Goal Formulation: With patient Time For Goal Achievement: 11/24/20 Potential to Achieve Goals: Good ADL Goals Pt Will Perform Lower Body Bathing: with min assist;sitting/lateral leans Pt Will Perform Upper Body Dressing: with set-up;sitting Pt Will Perform Lower Body Dressing: with min assist;sitting/lateral leans Pt Will Transfer to Toilet: with min assist;stand pivot transfer Pt Will Perform Toileting - Clothing Manipulation and hygiene: with set-up;sitting/lateral leans  Plan Discharge plan remains appropriate    Co-evaluation    PT/OT/SLP Co-Evaluation/Treatment: Yes Reason for Co-Treatment: Complexity of the patient's impairments (multi-system involvement);For patient/therapist safety;To address functional/ADL transfers   OT goals addressed during session: ADL's and self-care;Strengthening/ROM      AM-PAC OT "6 Clicks" Daily Activity     Outcome Measure   Help from another person eating meals?: None Help from another person taking care of personal grooming?: A Little Help from another person toileting, which includes using toliet, bedpan, or urinal?: Total Help from another person bathing (including washing, rinsing, drying)?: A Lot Help from another person to put on and taking off regular upper body clothing?: A Little Help from another person to put on and taking off regular lower body clothing?: A Lot 6 Click Score: 15    End of Session Equipment Utilized During Treatment: Other (comment) (RLE prosthetic)  OT Visit Diagnosis: Other abnormalities of gait and mobility  (R26.89);Muscle weakness (generalized) (M62.81);Pain Pain - Right/Left: Left Pain - part of body: Leg   Activity Tolerance Patient tolerated treatment well;Patient limited by fatigue   Patient Left in chair;with call bell/phone within reach   Nurse Communication Other (comment) (discussed taking pt off unit to go outside for a bit during OT/PT session)        Time: [id number] OT Time Calculation (min): 33 min  Charges: OT General Charges $OT Visit: 1 Visit OT Treatments $Self Care/Home Management : 8-22 mins  Tyrone Schimke, [place] Pager: [phone] Office: [phone]    Hortencia Pilar 11/16/2020, 2:32 PM

## 2020-11-16 NOTE — H&P (View-Only) (Signed)
[name], male   DOB: 1965-07-31, 55 y.o.   MRN: [id number] Patient is seen in follow-up status post a left transtibial amputation.  Semination patient has had progressive wound dehiscence laterally with saponified muscle and drainage.  Will need to proceed with a revision of the left transtibial amputation.  Plan for surgery tomorrow Friday.

## 2020-11-16 NOTE — Progress Notes (Signed)
[name], male   DOB: 1965/12/23, 55 y.o.   MRN: [name] Patient is seen in follow-up status post a left transtibial amputation.  Semination patient has had progressive wound dehiscence laterally with saponified muscle and drainage.  Will need to proceed with a revision of the left transtibial amputation.  Plan for surgery tomorrow Friday.

## 2020-11-17 ENCOUNTER — Inpatient Hospital Stay (HOSPITAL_COMMUNITY): Payer: Medicare HMO | Admitting: Anesthesiology

## 2020-11-17 ENCOUNTER — Encounter (HOSPITAL_COMMUNITY)
Admission: EM | Disposition: A | Discharge status: Home Health | Payer: Self-pay | Source: Home / Self Care | Attending: Internal Medicine

## 2020-11-17 DIAGNOSIS — D500 Iron deficiency anemia secondary to blood loss (chronic): Secondary | ICD-10-CM

## 2020-11-17 DIAGNOSIS — Z89512 Acquired absence of left leg below knee: Secondary | ICD-10-CM

## 2020-11-17 DIAGNOSIS — E785 Hyperlipidemia, unspecified: Secondary | ICD-10-CM

## 2020-11-17 DIAGNOSIS — B20 Human immunodeficiency virus [HIV] disease: Secondary | ICD-10-CM | POA: Diagnosis not present

## 2020-11-17 DIAGNOSIS — E872 Acidosis: Secondary | ICD-10-CM

## 2020-11-17 DIAGNOSIS — N179 Acute kidney failure, unspecified: Secondary | ICD-10-CM | POA: Diagnosis not present

## 2020-11-17 DIAGNOSIS — M86172 Other acute osteomyelitis, left ankle and foot: Secondary | ICD-10-CM | POA: Diagnosis not present

## 2020-11-17 DIAGNOSIS — T8781 Dehiscence of amputation stump: Secondary | ICD-10-CM | POA: Diagnosis not present

## 2020-11-17 HISTORY — PX: AMPUTATION: SHX166

## 2020-11-17 LAB — GLUCOSE, CAPILLARY
Glucose-Capillary: 104 mg/dL — ABNORMAL HIGH (ref 70–99)
Glucose-Capillary: 100 mg/dL — ABNORMAL HIGH (ref 70–99)
Glucose-Capillary: 120 mg/dL — ABNORMAL HIGH (ref 70–99)
Glucose-Capillary: 104 mg/dL — ABNORMAL HIGH (ref 70–99)
Glucose-Capillary: 112 mg/dL — ABNORMAL HIGH (ref 70–99)

## 2020-11-17 LAB — CBC WITH DIFFERENTIAL/PLATELET
Abs Immature Granulocytes: 0.05 10*3/uL (ref 0.00–0.07)
Basophils Absolute: 0 10*3/uL (ref 0.0–0.1)
Basophils Relative: 0 %
Eosinophils Absolute: 0.6 10*3/uL — ABNORMAL HIGH (ref 0.0–0.5)
Eosinophils Relative: 6 %
HCT: 23.1 % — ABNORMAL LOW (ref 39.0–52.0)
Hemoglobin: 7.5 g/dL — ABNORMAL LOW (ref 13.0–17.0)
Immature Granulocytes: 1 %
Lymphocytes Relative: 18 %
Lymphs Abs: 1.7 10*3/uL (ref 0.7–4.0)
MCH: 29.3 pg (ref 26.0–34.0)
MCHC: 32.5 g/dL (ref 30.0–36.0)
MCV: 90.2 fL (ref 80.0–100.0)
Monocytes Absolute: 0.7 10*3/uL (ref 0.1–1.0)
Monocytes Relative: 7 %
Neutro Abs: 6.2 10*3/uL (ref 1.7–7.7)
Neutrophils Relative %: 68 %
Platelets: 284 10*3/uL (ref 150–400)
RBC: 2.56 MIL/uL — ABNORMAL LOW (ref 4.22–5.81)
RDW: 14.8 % (ref 11.5–15.5)
WBC: 9.1 10*3/uL (ref 4.0–10.5)
nRBC: 0 % (ref 0.0–0.2)

## 2020-11-17 LAB — RENAL FUNCTION PANEL
Albumin: 2.3 g/dL — ABNORMAL LOW (ref 3.5–5.0)
Anion gap: 7 (ref 5–15)
BUN: 19 mg/dL (ref 6–20)
CO2: 23 mmol/L (ref 22–32)
Calcium: 8.4 mg/dL — ABNORMAL LOW (ref 8.9–10.3)
Chloride: 107 mmol/L (ref 98–111)
Creatinine, Ser: 2.15 mg/dL — ABNORMAL HIGH (ref 0.61–1.24)
GFR, Estimated: 35 mL/min — ABNORMAL LOW (ref >60)
Glucose, Bld: 114 mg/dL — ABNORMAL HIGH (ref 70–99)
Phosphorus: 3.6 mg/dL (ref 2.5–4.6)
Potassium: 4 mmol/L (ref 3.5–5.1)
Sodium: 137 mmol/L (ref 135–145)

## 2020-11-17 LAB — PREPARE RBC (CROSSMATCH)

## 2020-11-17 SURGERY — AMPUTATION BELOW KNEE
Anesthesia: Regional | Site: Knee | Laterality: Left

## 2020-11-17 MED ORDER — MAGNESIUM SULFATE 2 GM/50ML IV SOLN
2.0000 g | Freq: Every day | INTRAVENOUS | Status: DC | PRN
  Filled 2020-11-17: qty 50

## 2020-11-17 MED ORDER — ORAL CARE MOUTH RINSE: 15.0000 mL | Freq: Once | OROMUCOSAL | Status: AC

## 2020-11-17 MED ORDER — MIDAZOLAM HCL 2 MG/2ML IJ SOLN
INTRAMUSCULAR | Status: AC
  Filled 2020-11-17: qty 2

## 2020-11-17 MED ORDER — STERILE WATER FOR IRRIGATION IR SOLN
Status: DC | PRN
  Administered 2020-11-17: 700 mL

## 2020-11-17 MED ORDER — MIDAZOLAM HCL 2 MG/2ML IJ SOLN
INTRAMUSCULAR | Status: AC
  Administered 2020-11-17: 1 mg via INTRAVENOUS
  Filled 2020-11-17: qty 2

## 2020-11-17 MED ORDER — ACETAMINOPHEN 325 MG PO TABS: 325.0000 mg | ORAL_TABLET | Freq: Four times a day (QID) | ORAL | Status: DC | PRN

## 2020-11-17 MED ORDER — HYDROMORPHONE HCL 1 MG/ML IJ SOLN: 0.2500 mg | INTRAMUSCULAR | Status: DC | PRN

## 2020-11-17 MED ORDER — METOPROLOL TARTRATE 5 MG/5ML IV SOLN: 2.0000 mg | INTRAVENOUS | Status: DC | PRN

## 2020-11-17 MED ORDER — BUPIVACAINE HCL (PF) 0.5 % IJ SOLN
INTRAMUSCULAR | Status: DC | PRN
  Administered 2020-11-17: 10 mL via PERINEURAL

## 2020-11-17 MED ORDER — MAGNESIUM CITRATE PO SOLN: 1.0000 | Freq: Once | ORAL | Status: DC | PRN

## 2020-11-17 MED ORDER — OXYCODONE HCL 5 MG PO TABS: 5.0000 mg | ORAL_TABLET | Freq: Once | ORAL | Status: DC | PRN

## 2020-11-17 MED ORDER — ALUM & MAG HYDROXIDE-SIMETH 200-200-20 MG/5ML PO SUSP: 15.0000 mL | ORAL | Status: DC | PRN

## 2020-11-17 MED ORDER — CHLORHEXIDINE GLUCONATE 0.12 % MT SOLN
15.0000 mL | Freq: Once | OROMUCOSAL | Status: AC
  Administered 2020-11-17: 15 mL via OROMUCOSAL
  Filled 2020-11-17: qty 15

## 2020-11-17 MED ORDER — ZINC SULFATE 220 (50 ZN) MG PO CAPS
220.0000 mg | ORAL_CAPSULE | Freq: Every day | ORAL | Status: AC
  Administered 2020-11-18 – 2020-11-30 (×13): 220 mg via ORAL
  Filled 2020-11-17 (×13): qty 1

## 2020-11-17 MED ORDER — FENTANYL CITRATE (PF) 250 MCG/5ML IJ SOLN
INTRAMUSCULAR | Status: DC | PRN
  Administered 2020-11-17: 50 ug via INTRAVENOUS

## 2020-11-17 MED ORDER — PHENOL 1.4 % MT LIQD: 1.0000 | OROMUCOSAL | Status: DC | PRN

## 2020-11-17 MED ORDER — SODIUM CHLORIDE 0.9 % IV SOLN: INTRAVENOUS | Status: DC

## 2020-11-17 MED ORDER — MIDAZOLAM HCL 2 MG/2ML IJ SOLN: 1.0000 mg | Freq: Once | INTRAMUSCULAR | Status: AC

## 2020-11-17 MED ORDER — LIDOCAINE 2% (20 MG/ML) 5 ML SYRINGE
INTRAMUSCULAR | Status: DC | PRN
  Administered 2020-11-17: 100 mg via INTRAVENOUS

## 2020-11-17 MED ORDER — PANTOPRAZOLE SODIUM 40 MG PO TBEC: 40.0000 mg | DELAYED_RELEASE_TABLET | Freq: Every day | ORAL | Status: DC

## 2020-11-17 MED ORDER — BISACODYL 5 MG PO TBEC: 5.0000 mg | DELAYED_RELEASE_TABLET | Freq: Every day | ORAL | Status: DC | PRN

## 2020-11-17 MED ORDER — DOCUSATE SODIUM 100 MG PO CAPS
100.0000 mg | ORAL_CAPSULE | Freq: Every day | ORAL | Status: DC
  Administered 2020-11-18 – 2020-12-25 (×34): 100 mg via ORAL
  Filled 2020-11-17 (×39): qty 1

## 2020-11-17 MED ORDER — ONDANSETRON HCL 4 MG/2ML IJ SOLN
4.0000 mg | Freq: Four times a day (QID) | INTRAMUSCULAR | Status: DC | PRN
  Administered 2020-11-18: 4 mg via INTRAVENOUS
  Filled 2020-11-17 (×2): qty 2

## 2020-11-17 MED ORDER — CEFAZOLIN SODIUM-DEXTROSE 2-4 GM/100ML-% IV SOLN
2.0000 g | Freq: Three times a day (TID) | INTRAVENOUS | Status: AC
  Administered 2020-11-18 (×2): 2 g via INTRAVENOUS
  Filled 2020-11-17 (×2): qty 100

## 2020-11-17 MED ORDER — TRANEXAMIC ACID-NACL 1000-0.7 MG/100ML-% IV SOLN
INTRAVENOUS | Status: AC
  Filled 2020-11-17: qty 100

## 2020-11-17 MED ORDER — ASCORBIC ACID 500 MG PO TABS: 1000.0000 mg | ORAL_TABLET | Freq: Every day | ORAL | Status: DC

## 2020-11-17 MED ORDER — PROMETHAZINE HCL 25 MG/ML IJ SOLN: 6.2500 mg | INTRAMUSCULAR | Status: DC | PRN

## 2020-11-17 MED ORDER — OXYCODONE HCL 5 MG/5ML PO SOLN: 5.0000 mg | Freq: Once | ORAL | Status: DC | PRN

## 2020-11-17 MED ORDER — OXYCODONE HCL 5 MG PO TABS
10.0000 mg | ORAL_TABLET | ORAL | Status: DC | PRN
  Administered 2020-11-19 – 2020-12-03 (×3): 15 mg via ORAL
  Filled 2020-11-17 (×4): qty 3

## 2020-11-17 MED ORDER — HYDROMORPHONE HCL 1 MG/ML IJ SOLN
0.5000 mg | INTRAMUSCULAR | Status: DC | PRN
  Administered 2020-11-18 – 2020-11-21 (×11): 1 mg via INTRAVENOUS
  Administered 2020-11-21: 0.5 mg via INTRAVENOUS
  Administered 2020-11-21 – 2020-11-22 (×3): 1 mg via INTRAVENOUS
  Administered 2020-11-22: 0.5 mg via INTRAVENOUS
  Administered 2020-11-23 – 2020-12-01 (×22): 1 mg via INTRAVENOUS
  Administered 2020-12-01: 0.5 mg via INTRAVENOUS
  Administered 2020-12-01: 1 mg via INTRAVENOUS
  Administered 2020-12-02: 0.5 mg via INTRAVENOUS
  Administered 2020-12-02 – 2020-12-03 (×3): 1 mg via INTRAVENOUS
  Filled 2020-11-17 (×47): qty 1

## 2020-11-17 MED ORDER — PHENYLEPHRINE HCL-NACL 20-0.9 MG/250ML-% IV SOLN
INTRAVENOUS | Status: DC | PRN
  Administered 2020-11-17: 25 ug/min via INTRAVENOUS

## 2020-11-17 MED ORDER — ALBUMIN HUMAN 5 % IV SOLN: INTRAVENOUS | Status: DC | PRN

## 2020-11-17 MED ORDER — PHENYLEPHRINE 40 MCG/ML (10ML) SYRINGE FOR IV PUSH (FOR BLOOD PRESSURE SUPPORT)
PREFILLED_SYRINGE | INTRAVENOUS | Status: DC | PRN
  Administered 2020-11-17 (×4): 80 ug via INTRAVENOUS

## 2020-11-17 MED ORDER — BUPIVACAINE LIPOSOME 1.3 % IJ SUSP
INTRAMUSCULAR | Status: DC | PRN
  Administered 2020-11-17: 10 mL via PERINEURAL

## 2020-11-17 MED ORDER — GUAIFENESIN-DM 100-10 MG/5ML PO SYRP: 15.0000 mL | ORAL_SOLUTION | ORAL | Status: DC | PRN

## 2020-11-17 MED ORDER — JUVEN PO PACK
1.0000 | PACK | Freq: Two times a day (BID) | ORAL | Status: DC
  Administered 2020-11-18 – 2020-12-05 (×30): 1 via ORAL
  Filled 2020-11-17 (×31): qty 1

## 2020-11-17 MED ORDER — ROPIVACAINE HCL 5 MG/ML IJ SOLN
INTRAMUSCULAR | Status: DC | PRN
  Administered 2020-11-17: 20 mL via PERINEURAL

## 2020-11-17 MED ORDER — POTASSIUM CHLORIDE CRYS ER 20 MEQ PO TBCR: 20.0000 meq | EXTENDED_RELEASE_TABLET | Freq: Every day | ORAL | Status: DC | PRN

## 2020-11-17 MED ORDER — FENTANYL CITRATE (PF) 100 MCG/2ML IJ SOLN
INTRAMUSCULAR | Status: AC
  Administered 2020-11-17: 50 ug via INTRAVENOUS
  Filled 2020-11-17: qty 2

## 2020-11-17 MED ORDER — SODIUM CHLORIDE 0.9% IV SOLUTION: Freq: Once | INTRAVENOUS | Status: DC

## 2020-11-17 MED ORDER — TRANEXAMIC ACID-NACL 1000-0.7 MG/100ML-% IV SOLN
INTRAVENOUS | Status: DC | PRN
  Administered 2020-11-17: 1000 mg via INTRAVENOUS

## 2020-11-17 MED ORDER — LACTATED RINGERS IV SOLN: INTRAVENOUS | Status: DC

## 2020-11-17 MED ORDER — FENTANYL CITRATE (PF) 250 MCG/5ML IJ SOLN
INTRAMUSCULAR | Status: AC
  Filled 2020-11-17: qty 5

## 2020-11-17 MED ORDER — POLYETHYLENE GLYCOL 3350 17 G PO PACK: 17.0000 g | PACK | Freq: Every day | ORAL | Status: DC | PRN

## 2020-11-17 MED ORDER — LABETALOL HCL 5 MG/ML IV SOLN: 10.0000 mg | INTRAVENOUS | Status: DC | PRN

## 2020-11-17 MED ORDER — 0.9 % SODIUM CHLORIDE (POUR BTL) OPTIME
TOPICAL | Status: DC | PRN
  Administered 2020-11-17: 1000 mL

## 2020-11-17 MED ORDER — PROPOFOL 10 MG/ML IV BOLUS
INTRAVENOUS | Status: DC | PRN
  Administered 2020-11-17: 100 mg via INTRAVENOUS

## 2020-11-17 MED ORDER — HYDRALAZINE HCL 20 MG/ML IJ SOLN: 5.0000 mg | INTRAMUSCULAR | Status: DC | PRN

## 2020-11-17 MED ORDER — ONDANSETRON HCL 4 MG/2ML IJ SOLN
INTRAMUSCULAR | Status: DC | PRN
  Administered 2020-11-17: 4 mg via INTRAVENOUS

## 2020-11-17 MED ORDER — ASCORBIC ACID 500 MG PO TABS
1000.0000 mg | ORAL_TABLET | Freq: Every day | ORAL | Status: DC
  Administered 2020-11-18 – 2020-12-27 (×40): 1000 mg via ORAL
  Filled 2020-11-17 (×40): qty 2

## 2020-11-17 MED ORDER — OXYCODONE HCL 5 MG PO TABS
5.0000 mg | ORAL_TABLET | ORAL | Status: DC | PRN
  Administered 2020-12-11 – 2020-12-26 (×3): 10 mg via ORAL
  Filled 2020-11-17: qty 1
  Filled 2020-11-17 (×2): qty 2
  Filled 2020-11-17: qty 1
  Filled 2020-11-17: qty 2

## 2020-11-17 MED ORDER — FENTANYL CITRATE (PF) 100 MCG/2ML IJ SOLN: 50.0000 ug | Freq: Once | INTRAMUSCULAR | Status: AC

## 2020-11-17 SURGICAL SUPPLY — 38 items
BAG COUNTER SPONGE SURGICOUNT (BAG) IMPLANT
BAG SPNG CNTER NS LX DISP (BAG)
BLADE SAW RECIP 87.9 MT (BLADE) ×2 IMPLANT
BLADE SURG 21 STRL SS (BLADE) ×2 IMPLANT
BNDG COHESIVE 6X5 TAN STRL LF (GAUZE/BANDAGES/DRESSINGS) IMPLANT
CANISTER WOUND CARE 500ML ATS (WOUND CARE) ×2 IMPLANT
COVER SURGICAL LIGHT HANDLE (MISCELLANEOUS) ×2 IMPLANT
CUFF TOURN SGL QUICK 34 (TOURNIQUET CUFF) ×2
CUFF TRNQT CYL 34X4.125X (TOURNIQUET CUFF) ×1 IMPLANT
DRAPE DERMATAC (DRAPES) ×3 IMPLANT
DRAPE INCISE IOBAN 66X45 STRL (DRAPES) ×2 IMPLANT
DRAPE U-SHAPE 47X51 STRL (DRAPES) ×2 IMPLANT
DRESSING PREVENA PLUS CUSTOM (GAUZE/BANDAGES/DRESSINGS) ×1 IMPLANT
DRSG PREVENA PLUS CUSTOM (GAUZE/BANDAGES/DRESSINGS) ×2
DURAPREP 26ML APPLICATOR (WOUND CARE) ×2 IMPLANT
ELECT REM PT RETURN 9FT ADLT (ELECTROSURGICAL) ×2
ELECTRODE REM PT RTRN 9FT ADLT (ELECTROSURGICAL) ×1 IMPLANT
GLOVE SURG ORTHO LTX SZ9 (GLOVE) ×2 IMPLANT
GLOVE SURG UNDER POLY LF SZ9 (GLOVE) ×2 IMPLANT
GOWN STRL REUS W/ TWL XL LVL3 (GOWN DISPOSABLE) ×2 IMPLANT
GOWN STRL REUS W/TWL XL LVL3 (GOWN DISPOSABLE) ×4
KIT BASIN OR (CUSTOM PROCEDURE TRAY) ×2 IMPLANT
KIT TURNOVER KIT B (KITS) ×2 IMPLANT
MANIFOLD NEPTUNE II (INSTRUMENTS) ×2 IMPLANT
NS IRRIG 1000ML POUR BTL (IV SOLUTION) ×2 IMPLANT
PACK ORTHO EXTREMITY (CUSTOM PROCEDURE TRAY) ×2 IMPLANT
PAD ARMBOARD 7.5X6 YLW CONV (MISCELLANEOUS) ×2 IMPLANT
PREVENA RESTOR ARTHOFORM 46X30 (CANNISTER) ×2 IMPLANT
SPONGE T-LAP 18X18 ~~LOC~~+RFID (SPONGE) ×1 IMPLANT
STAPLER VISISTAT 35W (STAPLE) ×1 IMPLANT
STOCKINETTE IMPERVIOUS LG (DRAPES) ×2 IMPLANT
SUT ETHILON 2 0 PSLX (SUTURE) ×1 IMPLANT
SUT SILK 2 0 (SUTURE) ×2
SUT SILK 2-0 18XBRD TIE 12 (SUTURE) ×1 IMPLANT
SUT VIC AB 1 CTX 27 (SUTURE) ×4 IMPLANT
TOWEL GREEN STERILE (TOWEL DISPOSABLE) ×2 IMPLANT
TUBE CONNECTING 12X1/4 (SUCTIONS) ×2 IMPLANT
YANKAUER SUCT BULB TIP NO VENT (SUCTIONS) ×2 IMPLANT

## 2020-11-17 NOTE — Anesthesia Preprocedure Evaluation (Addendum)
Anesthesia Evaluation  Patient identified by MRN, date of birth, ID band Patient awake    Reviewed: Allergy & Precautions, NPO status , Patient's Chart, lab work & pertinent test results  Airway Mallampati: III  TM Distance: >3 FB Neck ROM: Full  Mouth opening: Limited Mouth Opening Comment: Limited mouth opening likely 2/2 poor pt cooperation Dental  (+) Dental Advisory Given, Edentulous Upper, Edentulous Lower   Pulmonary neg pulmonary ROS,    Pulmonary exam normal breath sounds clear to auscultation       Cardiovascular hypertension (153/96 in preop), Pt. on medications + Peripheral Vascular Disease and +CHF (grade 1 diastolic dysfunction)  Normal cardiovascular exam Rhythm:Regular Rate:Normal  Echo 2021: 1. Technically difficult study with poor acoustical windows and patient  cooperation even with definity contrast. Left ventricular ejection  fraction, by estimation, is 55 to 60%. The left ventricle has normal  function. Left ventricular endocardial border  not optimally defined to evaluate regional wall motion but grossly appear  normal. There is mild concentric left ventricular hypertrophy. Left  ventricular diastolic parameters are consistent with Grade I diastolic  dysfunction (impaired relaxation).  2. Right ventricular systolic function is normal. The right ventricular  size is normal. Tricuspid regurgitation signal is inadequate for assessing  PA pressure.  3. The mitral valve is normal in structure. No evidence of mitral valve  regurgitation. No evidence of mitral stenosis.  4. The aortic valve is normal in structure. Aortic valve regurgitation is  not visualized. No aortic stenosis is present.  5. Aortic dilatation noted. There is mild dilatation of the ascending  aorta and of the aortic root, measuring 38 mm.  6. The inferior vena cava is normal in size with greater than 50%  respiratory variability,  suggesting right atrial pressure of 3 mmHg.    Neuro/Psych CVA (04/2020) negative psych ROS   GI/Hepatic negative GI ROS, Neg liver ROS,   Endo/Other  diabetes, Poorly Controlled, Type 2, Oral Hypoglycemic Agents, Insulin DependentMorbid obesityBMI 39  Renal/GU CRFRenal diseaseCr 2.15, CKD 4  negative genitourinary   Musculoskeletal  (+) Arthritis , Osteoarthritis,    Abdominal   Peds negative pediatric ROS (+)  Hematology  (+) Blood dyscrasia, anemia , HIV, H/H 7.5/23.1 this AM- s/p 1 unit prbc, another unit ordered which we will give preop   Anesthesia Other Findings   Reproductive/Obstetrics negative OB ROS                            Anesthesia Physical Anesthesia Plan  ASA: 3  Anesthesia Plan: General and Regional   Post-op Pain Management: GA combined w/ Regional for post-op pain   Induction: Intravenous  PONV Risk Score and Plan: 2 and Ondansetron, Midazolam and Treatment may vary due to age or medical condition  Airway Management Planned: LMA  Additional Equipment: None  Intra-op Plan:   Post-operative Plan: Extubation in OR  Informed Consent: I have reviewed the patients History and Physical, chart, labs and discussed the procedure including the risks, benefits and alternatives for the proposed anesthesia with the patient or authorized representative who has indicated his/her understanding and acceptance.     Dental advisory given  Plan Discussed with: CRNA  Anesthesia Plan Comments:         Anesthesia Quick Evaluation

## 2020-11-17 NOTE — TOC Progression Note (Addendum)
Transition of Care [facility]) - Progression Note    Patient Details  Name: Keith [name] MRN: [name] Date of Birth: [birth year]  Transition of Care [facility]) CM/SW [place], [place] Phone Number: 11/17/2020, 12:53 PM  Clinical Narrative:     [facility] and [place] are reviewing pt clinicals.   CSW also received notification that [place] can accept pt back. Unclear if he will owe copay prior to admitting.   1338: Informed pt has [employer] in addition to [employer]. Holli Humbles would need to be started. CSW spoke with pt about [place] offer; he prefers elsewhere. CSW explains that Medicare opens options a little more and that CSW would check into additional facilities but if no other offers; [place] would be only option. Pt is calling his mother to get Medicare insurance card.   CSW resubmitted to SNF facilities that take [employer].   Expected Discharge Plan: Skilled Nursing Facility Barriers to Discharge: SNF Pending bed offer  Expected Discharge Plan and Services Expected Discharge Plan: [place] In-house Referral: Clinical Social Work Discharge Planning Services: CM Consult Post Acute Care Choice: [place] arrangements for the past 2 months: Single Family Home                                       Social Determinants of Health (SDOH) Interventions    Readmission Risk Interventions Readmission Risk Prevention Plan 10/21/2020 11/13/2018 11/13/2018  Transportation Screening Complete - Complete  PCP or Specialist Appt within 3-5 Days - - -  [place]RI or [place] Work Consult for [place] - - -  Medication Review Press photographer) Complete - Complete  PCP or Specialist appointment within 3-5 days of discharge Complete Complete -  [place] or Home Care Consult Complete Complete -  SW Recovery Care/Counseling Consult  Complete Complete -  Palliative Care Screening Not Applicable Not Applicable -  [place] Complete Not Applicable -  Some encounter information is confidential and restricted. Go to Review Flowsheets activity to see all data.  Some recent data might be hidden

## 2020-11-17 NOTE — Anesthesia Postprocedure Evaluation (Signed)
Anesthesia Post Note  Patient: Keith [name]  Procedure(s) Performed: REVISION AMPUTATION BELOW KNEE, LEFT (Left: Knee)     Patient location during evaluation: PACU Anesthesia Type: General Level of consciousness: awake and alert Pain management: pain level controlled Vital Signs Assessment: post-procedure vital signs reviewed and stable Respiratory status: spontaneous breathing, nonlabored ventilation, respiratory function stable and patient connected to nasal cannula oxygen Cardiovascular status: blood pressure returned to baseline and stable Postop Assessment: no apparent nausea or vomiting Anesthetic complications: no   No notable events documented.  Last Vitals:  Vitals:   11/17/20 2015 11/17/20 2040  BP: 123/85 113/79  Pulse: 72 75  Resp: 12 18  Temp: 36.7 C 36.7 C  SpO2: 99% 100%    Last Pain:  Vitals:   11/17/20 2040  TempSrc: Oral  PainSc:                  Alice Burnside S

## 2020-11-17 NOTE — Transfer of Care (Signed)
Immediate Anesthesia Transfer of Care Note  Patient: Keith [name]  Procedure(s) Performed: REVISION AMPUTATION BELOW KNEE (Left: Knee)  Patient Location: PACU  Anesthesia Type:GA combined with regional for post-op pain  Level of Consciousness: awake, alert  and oriented  Airway & Oxygen Therapy: Patient Spontanous Breathing and Patient connected to face mask oxygen  Post-op Assessment: Report given to RN and Post -op Vital signs reviewed and stable  Post vital signs: Reviewed and stable  Last Vitals:  Vitals Value Taken Time  BP 108/75 11/17/20 1942  Temp    Pulse 71 11/17/20 1944  Resp 23 11/17/20 1944  SpO2 100 % 11/17/20 1944  Vitals shown include unvalidated device data.  Last Pain:  Vitals:   11/17/20 1810  TempSrc:   PainSc: 0-No pain      Patients Stated Pain Goal: 0 ([id number])  Complications: No notable events documented.

## 2020-11-17 NOTE — Anesthesia Procedure Notes (Addendum)
Anesthesia Regional Block: Popliteal block   Pre-Anesthetic Checklist: , timeout performed,  Correct Patient, Correct Site, Correct Laterality,  Correct Procedure, Correct Position, site marked,  Risks and benefits discussed,  Surgical consent,  Pre-op evaluation,  At surgeon's request and post-op pain management  Laterality: Left  Prep: Maximum Sterile Barrier Precautions used, chloraprep       Needles:  Injection technique: Single-shot  Needle Type: Echogenic Stimulator Needle     Needle Length: 9cm  Needle Gauge: 22     Additional Needles:   Procedures:,,,, ultrasound used (permanent image in chart),,    Narrative:  Start time: 11/17/2020 5:50 PM End time: 11/17/2020 5:55 PM Injection made incrementally with aspirations every 5 mL.  Performed by: Personally  Anesthesiologist: Pervis Hocking, DO  Additional Notes: Monitors applied. No increased pain on injection. No increased resistance to injection. Injection made in 5cc increments. Good needle visualization. Patient tolerated procedure well.

## 2020-11-17 NOTE — Anesthesia Procedure Notes (Signed)
Procedure Name: LMA Insertion Date/Time: 11/17/2020 6:59 PM Performed by: Carolan Clines, CRNA Pre-anesthesia Checklist: Patient identified, Emergency Drugs available, Suction available and Patient being monitored Patient Re-evaluated:Patient Re-evaluated prior to induction Oxygen Delivery Method: Circle System Utilized Preoxygenation: Pre-oxygenation with 100% oxygen Induction Type: IV induction LMA: LMA inserted LMA Size: 5.0 Number of attempts: 1 Placement Confirmation: positive ETCO2 Tube secured with: Tape Dental Injury: Teeth and Oropharynx as per pre-operative assessment

## 2020-11-17 NOTE — Anesthesia Procedure Notes (Signed)
Anesthesia Regional Block: Adductor canal block   Pre-Anesthetic Checklist: , timeout performed,  Correct Patient, Correct Site, Correct Laterality,  Correct Procedure, Correct Position, site marked,  Risks and benefits discussed,  Surgical consent,  Pre-op evaluation,  At surgeon's request and post-op pain management  Laterality: Left  Prep: Maximum Sterile Barrier Precautions used, chloraprep       Needles:  Injection technique: Single-shot  Needle Type: Echogenic Stimulator Needle     Needle Length: 9cm  Needle Gauge: 22     Additional Needles:   Procedures:,,,, ultrasound used (permanent image in chart),,    Narrative:  Start time: 11/17/2020 5:55 PM End time: 11/17/2020 6:00 PM Injection made incrementally with aspirations every 5 mL.  Performed by: Personally  Anesthesiologist: Pervis Hocking, DO  Additional Notes: Monitors applied. No increased pain on injection. No increased resistance to injection. Injection made in 5cc increments. Good needle visualization. Patient tolerated procedure well.

## 2020-11-17 NOTE — Op Note (Signed)
11/17/2020  7:38 PM  PATIENT:  Keith [name]    PRE-OPERATIVE DIAGNOSIS:  Type 2 Diabetes  POST-OPERATIVE DIAGNOSIS:  Same  PROCEDURE:  REVISION AMPUTATION BELOW KNEE  SURGEON:  [name], MD  PHYSICIAN ASSISTANT:None ANESTHESIA:   General  PREOPERATIVE INDICATIONS:  [name] is a  55 y.o. male with a diagnosis of Type 2 Diabetes who failed conservative measures and elected for surgical management.    The risks benefits and alternatives were discussed with the patient preoperatively including but not limited to the risks of infection, bleeding, nerve injury, cardiopulmonary complications, the need for revision surgery, among others, and the patient was willing to proceed.  OPERATIVE IMPLANTS: Praveena customizable and Arthur form wound VAC  '@ENCIMAGES'$ @  OPERATIVE FINDINGS: All compartments had ischemic muscle changes.  Resection was carried back to healthy viable contractile muscle.  OPERATIVE PROCEDURE: Patient was brought the operating room and underwent a general anesthetic.  After adequate levels anesthesia obtained patient's left lower extremity was prepped using DuraPrep draped into a sterile field a timeout was called.  A fishmouth incision was made proximal to the ulcerative tissue.  This was carried sharply down to bone and the distal 3 cm of tibia and fibula were resected.  The vascular bundles were suture ligated with 2-0 silk.  Electrocardio was used for further hemostasis.  The wound edges were reapproximated with deep and superficial fascial layers and skin.  The wound was then finally closed with staples.  The customizable and Arnell Sieving form wound VAC was applied this had a good suction fit this was covered with the stump shrinker and limb protector.  Patient was extubated taken the PACU in stable condition.   DISCHARGE PLANNING:  Antibiotic duration: 24 hours antibiotics  Weightbearing: Nonweightbearing on the left  Pain medication: Opioid  pathway  Dressing care/ Wound VAC: Wound VAC for 1 week  Ambulatory devices: Walker  Discharge to: Anticipate discharge to home after rehab.  Follow-up: In the office 1 week post operative.

## 2020-11-17 NOTE — Progress Notes (Signed)
[name], male   DOB: [birth year], 55 y.o.   MRN: [name]  PROGRESS NOTE    Keith [name]  [id number] DOB: 1965-10-10 DOA: 10/17/2020 [name], NP   Brief Narrative:   55 year old male with past medical history of HIV, chronic kidney disease stage IIIb, type 2 diabetes mellitus, hypertension, hyperlipidemia, coronary artery disease, status post right below knee amputation who was sent to the emergency department by his orthopedic surgeon due to concern for left heel ulcer that had failed outpatient therapy.  Subsequently, MRI confirmed osteomyelitis.  He underwent left transtibial amputation by [name]. During the hospitalization, he had worsening renal function.  Nephrology was consulted.  He had temporary dialysis catheter placed and dialysis was started.  Later, he developed hematuria and required fulguration and CBI along with blood transfusions.  Renal function improved; dialysis was stopped and nephrology signed off. Subsequently, his wound dehisced with bleeding requiring blood transfusion.  Assessment & Plan:   Acute kidney injury, metabolic acidosis Urinary retention -Patient was started on dialysis during this admission but was subsequently stopped and nephrology signed off.  AKI was felt to be due to ATN.  Tunneled dialysis catheter has already been removed -Renal ultrasound was negative for hydronephrosis -Status post Foley catheter placement on 11/08/2020 with clots: Urology was consulted.  Underwent cystoscopy with fulguration on 11/09/2020 and started on CBI and Proscar.  Subsequently CBI was discontinued -Creatinine was improving but has worsened over the last 2 days.  Creatinine 2.05 on 11/14/2020 and worsened to 3.15 on 11/15/2020.  Creatinine improving to 2.51 today.  Continue gentle hydration for 1 more day and repeat a.m. creatinine. -Hematuria improving  Anemia due to acute blood loss from bladder and left stump -Hemoglobin 6.5 on 11/14/2020 for  which patient was transfused 2 more units packed red cells.  Hemoglobin 7.5 this morning.  Patient has had multiple units of packed red cell transfusion during this hospitalization. -Monitor H&H.  Left heel osteomyelitis and abscess status post transtibial amputation on 10/27/2020 with subsequent dehiscence of wound with bleeding -Patient has had significant bleeding from the wound with wound dehiscence requiring blood transfusions. -Orthopedics following and planning for surgical intervention today. CAD status post stenting in 03/2020  Dyslipidemia -Currently still on aspirin and Brilinta.  Continue Vascepa and Lipitor  Hyperkalemia -Resolved  HIV -Continue current regimen of antiretrovirals and dapsone as per ID.  Outpatient follow-up with ID  Diabetes mellitus type 2 with hyperglycemia -Continue to visit with SSI.  A1c 13.9 on 08/29/2020.  DVT prophylaxis: SCDs Code Status: Full Family Communication: None at bedside Disposition Plan: Status is: Inpatient  Remains inpatient appropriate because:Inpatient level of care appropriate due to severity of illness  Dispo:  Patient From: Home  Planned Disposition: SNF  Medically stable for discharge: No   Consultants: Nephrology/ID/urology/Ortho  Procedures: Dialysis cath placement and subsequent removal; left transtibial amputation  Antimicrobials:  Anti-infectives (From admission, onward)    Start     Dose/Rate Route Frequency Ordered Stop   11/17/20 0600  ceFAZolin (ANCEF) IVPB 3g/100 mL premix        3 g 200 mL/hr over 30 Minutes Intravenous On call to O.R. 11/16/20 1941 11/18/20 0559   11/11/20 1200  vancomycin (VANCOREADY) IVPB 500 mg/100 mL  Status:  Discontinued        500 mg 100 mL/hr over 60 Minutes Intravenous Every T-Th-Sa (Hemodialysis) 11/09/20 1414 11/11/20 1328   11/10/20 0000  Dolutegravir-lamiVUDine (DOVATO) 50-300 MG TABS  1 tablet Oral Daily 11/10/20 1048     11/10/20 0000  doravirine (PIFELTRO) 100 MG  TABS tablet        100 mg Oral Daily 11/10/20 1048     11/08/20 1000  dolutegravir (TIVICAY) tablet 50 mg        50 mg Oral Daily 11/07/20 1451     11/08/20 1000  lamiVUDine (EPIVIR) tablet 300 mg        300 mg Oral Daily 11/07/20 1451     11/08/20 1000  doravirine (PIFELTRO) tablet 100 mg        100 mg Oral Daily 11/07/20 1451     11/02/20 1130  ceFAZolin (ANCEF) IVPB 2g/100 mL premix        2 g 200 mL/hr over 30 Minutes Intravenous To Radiology 11/02/20 1042 11/02/20 1704   10/27/20 1245  ceFAZolin (ANCEF) IVPB 3g/100 mL premix        3 g 200 mL/hr over 30 Minutes Intravenous On call to O.R. 10/27/20 0755 10/27/20 1608   10/20/20 1415  linezolid (ZYVOX) tablet 600 mg  Status:  Discontinued        600 mg Oral Every 12 hours 10/20/20 1326 10/28/20 1115   10/19/20 1445  metroNIDAZOLE (FLAGYL) tablet 500 mg  Status:  Discontinued        500 mg Oral Every 12 hours 10/19/20 1347 10/28/20 1115   10/18/20 1600  ceFEPIme (MAXIPIME) 2 g in sodium chloride 0.9 % 100 mL IVPB  Status:  Discontinued        2 g 200 mL/hr over 30 Minutes Intravenous Every 12 hours 10/18/20 0414 10/28/20 1112   10/18/20 1000  dapsone tablet 100 mg        100 mg Oral Daily 10/18/20 0425     10/18/20 1000  bictegravir-emtricitabine-tenofovir AF (BIKTARVY) 50-200-25 MG per tablet 1 tablet  Status:  Discontinued        1 tablet Oral Daily 10/18/20 0610 11/07/20 1451   10/18/20 0415  metroNIDAZOLE (FLAGYL) IVPB 500 mg  Status:  Discontinued        500 mg 100 mL/hr over 60 Minutes Intravenous Every 8 hours 10/18/20 0404 10/19/20 1347   10/18/20 0415  ceFEPIme (MAXIPIME) 2 g in sodium chloride 0.9 % 100 mL IVPB        2 g 200 mL/hr over 30 Minutes Intravenous  Once 10/18/20 0414 10/18/20 0500   10/18/20 0345  vancomycin (VANCOREADY) IVPB 2000 mg/400 mL        2,000 mg 200 mL/hr over 120 Minutes Intravenous  Once 10/18/20 0335 10/18/20 [id number]         Subjective: Patient seen and examined at bedside.  Poor historian.   No fever, worsening shortness of breath, vomiting reported.  Complains of lower extremity pain. Objective: Vitals:   11/16/20 0850 11/16/20 1808 11/16/20 2106 11/17/20 0613  BP: (!) 127/95 (!) 139/91 (!) 144/93 (!) 166/92  Pulse: 77 75 77 70  Resp: '18 18 20 18  '$ Temp: 98.3 F (36.8 C) 98.4 F (36.9 C) 99 F (37.2 C) 98.6 F (37 C)  TempSrc:   Oral Oral  SpO2: 96% 99% 97% 100%  Weight:      Height:        Intake/Output Summary (Last 24 hours) at 11/17/2020 0817 Last data filed at 11/17/2020 0600 Gross per 24 hour  Intake 2390.31 ml  Output 2350 ml  Net 40.31 ml    Filed Weights   11/06/20 2046 11/09/20 2129 11/15/20 2116  Weight: 124.8 kg 124.7 kg 124.7 kg    Examination:  General exam: Currently on room air.  No acute distress.  Looks chronically ill and deconditioned.  Respiratory system: Bilateral decreased breath sounds at bases with scattered crackles  cardiovascular system: S1-S2 heard, rate controlled gastrointestinal system: Abdomen is mildly distended, soft and nontender.  Normal bowel sounds heard  extremities: No cyanosis; left BKA with dressing present Central nervous system: Awake, still extremely slow to respond.  Poor historian.  No obvious neurological deficits  skin: No obvious ecchymosis/other lesions psychiatry: Flat affect   Data Reviewed: I have personally reviewed following labs and imaging studies  CBC: Recent Labs  Lab 11/14/20 0429 11/14/20 1104 11/15/20 0557 11/16/20 0320 11/17/20 0353  WBC 10.2 14.8* 11.0* 9.1 9.1  NEUTROABS  --   --   --  6.3 6.2  HGB 7.6* 6.5* 7.6* 11.6* 7.5*  HCT 24.6* 20.8* 23.6* 35.8* 23.1*  MCV 92.8 91.2 89.7 89.3 90.2  PLT 244 236 230 200 [id number]    Basic Metabolic Panel: Recent Labs  Lab 11/13/20 0244 11/14/20 0429 11/15/20 0557 11/16/20 0320 11/17/20 0353  NA 134* 136 134* 136 137  K 3.5 4.0 3.6 4.2 4.0  CL 104 104 104 108 107  CO2 '23 25 23 '$ 21* 23  GLUCOSE 155* 171* 142* 153* 114*  BUN 25* 17 25* 24*  19  CREATININE 1.93* 2.05* 3.15* 2.50* 2.15*  CALCIUM 8.2* 8.6* 7.9* 8.1* 8.4*  MG  --   --   --  1.6*  --   PHOS 2.9 3.5 3.9 3.7 3.6    GFR: Estimated Creatinine Clearance: 50.8 mL/min (A) (by C-G formula based on SCr of 2.15 mg/dL (H)). Liver Function Tests: Recent Labs  Lab 11/13/20 0244 11/14/20 0429 11/15/20 0557 11/16/20 0320 11/17/20 0353  ALBUMIN 2.3* 2.1* 2.1* 2.2* 2.3*    No results for input(s): LIPASE, AMYLASE in the last 168 hours. No results for input(s): AMMONIA in the last 168 hours. Coagulation Profile: No results for input(s): INR, PROTIME in the last 168 hours. Cardiac Enzymes: No results for input(s): CKTOTAL, CKMB, CKMBINDEX, TROPONINI in the last 168 hours. BNP (last 3 results) No results for input(s): PROBNP in the last 8760 hours. HbA1C: No results for input(s): HGBA1C in the last 72 hours. CBG: Recent Labs  Lab 11/16/20 0642 11/16/20 1112 11/16/20 1622 11/16/20 2108 11/17/20 0639  GLUCAP 153* 142* 125* 117* 104*    Lipid Profile: No results for input(s): CHOL, HDL, LDLCALC, TRIG, CHOLHDL, LDLDIRECT in the last 72 hours. Thyroid Function Tests: No results for input(s): TSH, T4TOTAL, FREET4, T3FREE, THYROIDAB in the last 72 hours. Anemia Panel: No results for input(s): VITAMINB12, FOLATE, FERRITIN, TIBC, IRON, RETICCTPCT in the last 72 hours. Sepsis Labs: No results for input(s): PROCALCITON, LATICACIDVEN in the last 168 hours.  No results found for this or any previous visit (from the past 240 hour(s)).       Radiology Studies: No results found.      Scheduled Meds:  sodium chloride   Intravenous Once   allopurinol  100 mg Oral Daily   vitamin C  1,000 mg Oral Daily   aspirin EC  81 mg Oral Daily   atorvastatin  80 mg Oral Daily   Chlorhexidine Gluconate Cloth  6 each Topical Q0600   dapsone  100 mg Oral Daily   dolutegravir  50 mg Oral Daily   doravirine  100 mg Oral Daily   escitalopram  20 mg Oral Daily  feeding  supplement  296 mL Oral Once   finasteride  5 mg Oral Daily   gabapentin  100 mg Oral TID   icosapent Ethyl  2 g Oral BID   insulin aspart  0-15 Units Subcutaneous TID WC   insulin aspart  0-5 Units Subcutaneous QHS   lamiVUDine  300 mg Oral Daily   nutrition supplement (JUVEN)  1 packet Oral BID BM   oxybutynin  2.5 mg Oral BID   oxyCODONE  10 mg Oral QID   pantoprazole  40 mg Oral Daily   senna  2 tablet Oral QHS   ticagrelor  90 mg Oral BID   Continuous Infusions:  sodium chloride 75 mL/hr at 11/17/20 0515    ceFAZolin (ANCEF) IV            Aline August, MD [facility] 11/17/2020, 8:17 AM

## 2020-11-17 NOTE — Interval H&P Note (Signed)
History and Physical Interval Note:  11/17/2020 7:16 AM  Keith [name]  has presented today for surgery, with the diagnosis of Type 2 Diabetes.  The various methods of treatment have been discussed with the patient and family. After consideration of risks, benefits and other options for treatment, the patient has consented to  Procedure(s): REVISION AMPUTATION BELOW KNEE (Left) as a surgical intervention.  The patient's history has been reviewed, patient examined, no change in status, stable for surgery.  I have reviewed the patient's chart and labs.  Questions were answered to the patient's satisfaction.     [name]

## 2020-11-18 ENCOUNTER — Encounter (HOSPITAL_COMMUNITY): Payer: Self-pay | Admitting: Orthopedic Surgery

## 2020-11-18 DIAGNOSIS — M86172 Other acute osteomyelitis, left ankle and foot: Secondary | ICD-10-CM | POA: Diagnosis not present

## 2020-11-18 DIAGNOSIS — N179 Acute kidney failure, unspecified: Secondary | ICD-10-CM | POA: Diagnosis not present

## 2020-11-18 DIAGNOSIS — B20 Human immunodeficiency virus [HIV] disease: Secondary | ICD-10-CM | POA: Diagnosis not present

## 2020-11-18 DIAGNOSIS — T8781 Dehiscence of amputation stump: Secondary | ICD-10-CM | POA: Diagnosis not present

## 2020-11-18 LAB — BPAM RBC
Blood Product Expiration Date: 202209122359
Blood Product Expiration Date: 202209122359
Blood Product Expiration Date: 202209142359
Blood Product Expiration Date: 202209142359
ISSUE DATE / TIME: 202208091815
ISSUE DATE / TIME: 202208092235
ISSUE DATE / TIME: 202208121242
ISSUE DATE / TIME: 202208121720
Unit Type and Rh: 5100
Unit Type and Rh: 5100
Unit Type and Rh: 5100
Unit Type and Rh: 5100

## 2020-11-18 LAB — TYPE AND SCREEN
ABO/RH(D): O POS
Antibody Screen: NEGATIVE
Unit division: 0
Unit division: 0
Unit division: 0
Unit division: 0

## 2020-11-18 LAB — GLUCOSE, CAPILLARY
Glucose-Capillary: 134 mg/dL — ABNORMAL HIGH (ref 70–99)
Glucose-Capillary: 133 mg/dL — ABNORMAL HIGH (ref 70–99)
Glucose-Capillary: 139 mg/dL — ABNORMAL HIGH (ref 70–99)
Glucose-Capillary: 207 mg/dL — ABNORMAL HIGH (ref 70–99)
Glucose-Capillary: 152 mg/dL — ABNORMAL HIGH (ref 70–99)

## 2020-11-18 NOTE — Progress Notes (Signed)
PT Note:  New PT order received and acknowledged. PT with BKA revision. Will resume PT with current treatment plan on Monday 11/20/20.   Leighton Roach, [place]  Pager [phone] Office [phone]

## 2020-11-18 NOTE — Progress Notes (Signed)
[name], male   DOB: 1965-10-27, 55 y.o.   MRN: [id number]  PROGRESS NOTE    Keith [name]  [id number] DOB: 1966-03-19 DOA: 10/17/2020 [name], NP   Brief Narrative:   55 year old male with past medical history of HIV, chronic kidney disease stage IIIb, type 2 diabetes mellitus, hypertension, hyperlipidemia, coronary artery disease, status post right below knee amputation who was sent to the emergency department by his orthopedic surgeon due to concern for left heel ulcer that had failed outpatient therapy.  Subsequently, MRI confirmed osteomyelitis.  He underwent left transtibial amputation by [name]. During the hospitalization, he had worsening renal function.  Nephrology was consulted.  He had temporary dialysis catheter placed and dialysis was started.  Later, he developed hematuria and required fulguration and CBI along with blood transfusions.  Renal function improved; dialysis was stopped and nephrology signed off. Subsequently, his wound dehisced with bleeding requiring blood transfusion.  Assessment & Plan:   Acute kidney injury, metabolic acidosis Urinary retention -Patient was started on dialysis during this admission but was subsequently stopped and nephrology signed off.  AKI was felt to be due to ATN.  Tunneled dialysis catheter has already been removed -Renal ultrasound was negative for hydronephrosis -Status post Foley catheter placement on 11/08/2020 with clots: Urology was consulted.  Underwent cystoscopy with fulguration on 11/09/2020 and started on CBI and Proscar.  Subsequently CBI was discontinued -Creatinine was improving but has worsened over the last 2 days.  Creatinine 2.05 on 11/14/2020 and worsened to 3.15 on 11/15/2020.  Creatinine improving to 2.15 on 11/17/2020.  Labs pending for today.  Currently on gentle hydration -Hematuria improving  Anemia due to acute blood loss from bladder and left stump -Hemoglobin 6.5 on 11/14/2020 for which  patient was transfused 2 more units packed red cells.  Hemoglobin 7.5 on 11/17/2020.  Labs pending today.  Patient has had multiple units of packed red cell transfusion during this hospitalization. -Monitor H&H.  Left heel osteomyelitis and abscess status post transtibial amputation on 10/27/2020 with subsequent dehiscence of wound with bleeding -Patient has had significant bleeding from the wound with wound dehiscence requiring blood transfusions. -Orthopedics following: Status post revision of left below-knee amputation on 11/17/2020 by [name]  CAD status post stenting in 03/2020 Dyslipidemia -Currently still on aspirin and Brilinta.  Continue Vascepa and Lipitor  Hyperkalemia -Resolved  HIV -Continue current regimen of antiretrovirals and dapsone as per ID.  Outpatient follow-up with ID  Diabetes mellitus type 2 with hyperglycemia -Continue CBGs with SSI.  A1c 13.9 on 08/29/2020.  DVT prophylaxis: SCDs Code Status: Full Family Communication: None at bedside Disposition Plan: Status is: Inpatient  Remains inpatient appropriate because:Inpatient level of care appropriate due to severity of illness  Dispo:  Patient From: Home  Planned Disposition: SNF  Medically stable for discharge: No   Consultants: Nephrology/ID/urology/Ortho  Procedures: Dialysis cath placement and subsequent removal; left transtibial amputation Revision of left below-knee amputation on 11/17/2020 by [name]  Antimicrobials:  Anti-infectives (From admission, onward)    Start     Dose/Rate Route Frequency Ordered Stop   11/18/20 0100  ceFAZolin (ANCEF) IVPB 2g/100 mL premix        2 g 200 mL/hr over 30 Minutes Intravenous Every 8 hours 11/17/20 2045 11/18/20 1659   11/17/20 0600  ceFAZolin (ANCEF) IVPB 3g/100 mL premix        3 g 200 mL/hr over 30 Minutes Intravenous On call to O.R. 11/16/20 1941 11/17/20  1901   11/11/20 1200  vancomycin (VANCOREADY) IVPB 500 mg/100 mL  Status:  Discontinued         500 mg 100 mL/hr over 60 Minutes Intravenous Every T-Th-Sa (Hemodialysis) 11/09/20 1414 11/11/20 1328   11/10/20 0000  Dolutegravir-lamiVUDine (DOVATO) 50-300 MG TABS        1 tablet Oral Daily 11/10/20 1048     11/10/20 0000  doravirine (PIFELTRO) 100 MG TABS tablet        100 mg Oral Daily 11/10/20 1048     11/08/20 1000  dolutegravir (TIVICAY) tablet 50 mg        50 mg Oral Daily 11/07/20 1451     11/08/20 1000  lamiVUDine (EPIVIR) tablet 300 mg        300 mg Oral Daily 11/07/20 1451     11/08/20 1000  doravirine (PIFELTRO) tablet 100 mg        100 mg Oral Daily 11/07/20 1451     11/02/20 1130  ceFAZolin (ANCEF) IVPB 2g/100 mL premix        2 g 200 mL/hr over 30 Minutes Intravenous To Radiology 11/02/20 1042 11/02/20 1704   10/27/20 1245  ceFAZolin (ANCEF) IVPB 3g/100 mL premix        3 g 200 mL/hr over 30 Minutes Intravenous On call to O.R. 10/27/20 0755 10/27/20 1608   10/20/20 1415  linezolid (ZYVOX) tablet 600 mg  Status:  Discontinued        600 mg Oral Every 12 hours 10/20/20 1326 10/28/20 1115   10/19/20 1445  metroNIDAZOLE (FLAGYL) tablet 500 mg  Status:  Discontinued        500 mg Oral Every 12 hours 10/19/20 1347 10/28/20 1115   10/18/20 1600  ceFEPIme (MAXIPIME) 2 g in sodium chloride 0.9 % 100 mL IVPB  Status:  Discontinued        2 g 200 mL/hr over 30 Minutes Intravenous Every 12 hours 10/18/20 0414 10/28/20 1112   10/18/20 1000  dapsone tablet 100 mg        100 mg Oral Daily 10/18/20 0425     10/18/20 1000  bictegravir-emtricitabine-tenofovir AF (BIKTARVY) 50-200-25 MG per tablet 1 tablet  Status:  Discontinued        1 tablet Oral Daily 10/18/20 0610 11/07/20 1451   10/18/20 0415  metroNIDAZOLE (FLAGYL) IVPB 500 mg  Status:  Discontinued        500 mg 100 mL/hr over 60 Minutes Intravenous Every 8 hours 10/18/20 0404 10/19/20 1347   10/18/20 0415  ceFEPIme (MAXIPIME) 2 g in sodium chloride 0.9 % 100 mL IVPB        2 g 200 mL/hr over 30 Minutes Intravenous  Once  10/18/20 0414 10/18/20 0500   10/18/20 0345  vancomycin (VANCOREADY) IVPB 2000 mg/400 mL        2,000 mg 200 mL/hr over 120 Minutes Intravenous  Once 10/18/20 0335 10/18/20 [id number]         Subjective: Patient seen and examined at bedside.  Poor historian.  Complains of intermittent lower extremity pain.  No overnight worsening shortness of breath, fever or vomiting reported.   Objective: Vitals:   11/17/20 2000 11/17/20 2015 11/17/20 2040 11/18/20 0521  BP: 117/82 123/85 113/79 (!) 170/96  Pulse: 68 72 75 99  Resp: (!) '9 12 18 20  '$ Temp:  98 F (36.7 C) 98 F (36.7 C) (!) 100.9 F (38.3 C)  TempSrc:   Oral Oral  SpO2: 97% 99% 100% 98%  Weight:  Height:        Intake/Output Summary (Last 24 hours) at 11/18/2020 0817 Last data filed at 11/18/2020 0600 Gross per 24 hour  Intake 3266.17 ml  Output 2800 ml  Net 466.17 ml    Filed Weights   11/06/20 2046 11/09/20 2129 11/15/20 2116  Weight: 124.8 kg 124.7 kg 124.7 kg    Examination:  General exam: No distress.  On room air currently.  Looks chronically ill and deconditioned.  Respiratory system: Decreased air sounds at bases bilaterally with some crackles  cardiovascular system: Rate controlled, S1-S2 heard gastrointestinal system: Abdomen is distended slightly, soft and nontender.  Normal bowel sounds heard extremities: Right BKA present, left BKA with wound VAC present Central nervous system: Extremely slow to respond.  Poor historian.  No obvious focal deficits. skin: No obvious petechiae/other rashes psychiatry: Extremely flat affect  Data Reviewed: I have personally reviewed following labs and imaging studies  CBC: Recent Labs  Lab 11/14/20 0429 11/14/20 1104 11/15/20 0557 11/16/20 0320 11/17/20 0353  WBC 10.2 14.8* 11.0* 9.1 9.1  NEUTROABS  --   --   --  6.3 6.2  HGB 7.6* 6.5* 7.6* 11.6* 7.5*  HCT 24.6* 20.8* 23.6* 35.8* 23.1*  MCV 92.8 91.2 89.7 89.3 90.2  PLT 244 236 230 200 [id number]    Basic Metabolic  Panel: Recent Labs  Lab 11/13/20 0244 11/14/20 0429 11/15/20 0557 11/16/20 0320 11/17/20 0353  NA 134* 136 134* 136 137  K 3.5 4.0 3.6 4.2 4.0  CL 104 104 104 108 107  CO2 '23 25 23 '$ 21* 23  GLUCOSE 155* 171* 142* 153* 114*  BUN 25* 17 25* 24* 19  CREATININE 1.93* 2.05* 3.15* 2.50* 2.15*  CALCIUM 8.2* 8.6* 7.9* 8.1* 8.4*  MG  --   --   --  1.6*  --   PHOS 2.9 3.5 3.9 3.7 3.6    GFR: Estimated Creatinine Clearance: 50.8 mL/min (A) (by C-G formula based on SCr of 2.15 mg/dL (H)). Liver Function Tests: Recent Labs  Lab 11/13/20 0244 11/14/20 0429 11/15/20 0557 11/16/20 0320 11/17/20 0353  ALBUMIN 2.3* 2.1* 2.1* 2.2* 2.3*    No results for input(s): LIPASE, AMYLASE in the last 168 hours. No results for input(s): AMMONIA in the last 168 hours. Coagulation Profile: No results for input(s): INR, PROTIME in the last 168 hours. Cardiac Enzymes: No results for input(s): CKTOTAL, CKMB, CKMBINDEX, TROPONINI in the last 168 hours. BNP (last 3 results) No results for input(s): PROBNP in the last 8760 hours. HbA1C: No results for input(s): HGBA1C in the last 72 hours. CBG: Recent Labs  Lab 11/17/20 1139 11/17/20 1838 11/17/20 1941 11/17/20 2038 11/18/20 0631  GLUCAP 100* 120* 104* 112* 134*    Lipid Profile: No results for input(s): CHOL, HDL, LDLCALC, TRIG, CHOLHDL, LDLDIRECT in the last 72 hours. Thyroid Function Tests: No results for input(s): TSH, T4TOTAL, FREET4, T3FREE, THYROIDAB in the last 72 hours. Anemia Panel: No results for input(s): VITAMINB12, FOLATE, FERRITIN, TIBC, IRON, RETICCTPCT in the last 72 hours. Sepsis Labs: No results for input(s): PROCALCITON, LATICACIDVEN in the last 168 hours.  No results found for this or any previous visit (from the past 240 hour(s)).       Radiology Studies: No results found.      Scheduled Meds:  sodium chloride   Intravenous Once   allopurinol  100 mg Oral Daily   vitamin C  1,000 mg Oral Daily   aspirin  EC  81 mg Oral Daily  atorvastatin  80 mg Oral Daily   Chlorhexidine Gluconate Cloth  6 each Topical Q0600   dapsone  100 mg Oral Daily   docusate sodium  100 mg Oral Daily   dolutegravir  50 mg Oral Daily   doravirine  100 mg Oral Daily   escitalopram  20 mg Oral Daily   finasteride  5 mg Oral Daily   gabapentin  100 mg Oral TID   icosapent Ethyl  2 g Oral BID   insulin aspart  0-15 Units Subcutaneous TID WC   insulin aspart  0-5 Units Subcutaneous QHS   lamiVUDine  300 mg Oral Daily   nutrition supplement (JUVEN)  1 packet Oral BID BM   oxybutynin  2.5 mg Oral BID   oxyCODONE  10 mg Oral QID   pantoprazole  40 mg Oral Daily   senna  2 tablet Oral QHS   ticagrelor  90 mg Oral BID   zinc sulfate  220 mg Oral Daily   Continuous Infusions:  sodium chloride Stopped (11/17/20 1253)   sodium chloride 75 mL/hr at 11/17/20 2104    ceFAZolin (ANCEF) IV 2 g (11/18/20 0044)   magnesium sulfate bolus IVPB            Aline August, MD [facility] 11/18/2020, 8:17 AM

## 2020-11-18 NOTE — Progress Notes (Signed)
[name], male   DOB: [birth year], 55 y.o.   MRN: [name] Patient is postoperative day 1 revision left transtibial amputation.  The wound VAC has a good suction fit.  Anticipate discharge to skilled nursing.  Patient has no complaints this morning.

## 2020-11-18 NOTE — Plan of Care (Signed)
  Problem: Clinical Measurements: Goal: Ability to maintain clinical measurements within normal limits will improve Outcome: Progressing Goal: Will remain free from infection Outcome: Progressing Goal: Cardiovascular complication will be avoided Outcome: Progressing   

## 2020-11-19 DIAGNOSIS — M86172 Other acute osteomyelitis, left ankle and foot: Secondary | ICD-10-CM | POA: Diagnosis not present

## 2020-11-19 DIAGNOSIS — B20 Human immunodeficiency virus [HIV] disease: Secondary | ICD-10-CM | POA: Diagnosis not present

## 2020-11-19 DIAGNOSIS — N179 Acute kidney failure, unspecified: Secondary | ICD-10-CM | POA: Diagnosis not present

## 2020-11-19 DIAGNOSIS — T8781 Dehiscence of amputation stump: Secondary | ICD-10-CM | POA: Diagnosis not present

## 2020-11-19 LAB — CBC
HCT: 18.3 % — ABNORMAL LOW (ref 39.0–52.0)
Hemoglobin: 6 g/dL — CL (ref 13.0–17.0)
MCH: 29.9 pg (ref 26.0–34.0)
MCHC: 32.8 g/dL (ref 30.0–36.0)
MCV: 91 fL (ref 80.0–100.0)
Platelets: 249 10*3/uL (ref 150–400)
RBC: 2.01 MIL/uL — ABNORMAL LOW (ref 4.22–5.81)
RDW: 14.6 % (ref 11.5–15.5)
WBC: 11.5 10*3/uL — ABNORMAL HIGH (ref 4.0–10.5)
nRBC: 0 % (ref 0.0–0.2)

## 2020-11-19 LAB — RENAL FUNCTION PANEL
Albumin: 2 g/dL — ABNORMAL LOW (ref 3.5–5.0)
Anion gap: 7 (ref 5–15)
BUN: 22 mg/dL — ABNORMAL HIGH (ref 6–20)
CO2: 21 mmol/L — ABNORMAL LOW (ref 22–32)
Calcium: 7.7 mg/dL — ABNORMAL LOW (ref 8.9–10.3)
Chloride: 105 mmol/L (ref 98–111)
Creatinine, Ser: 2.3 mg/dL — ABNORMAL HIGH (ref 0.61–1.24)
GFR, Estimated: 33 mL/min — ABNORMAL LOW (ref >60)
Glucose, Bld: 119 mg/dL — ABNORMAL HIGH (ref 70–99)
Phosphorus: 3.3 mg/dL (ref 2.5–4.6)
Potassium: 3.9 mmol/L (ref 3.5–5.1)
Sodium: 133 mmol/L — ABNORMAL LOW (ref 135–145)

## 2020-11-19 LAB — GLUCOSE, CAPILLARY
Glucose-Capillary: 113 mg/dL — ABNORMAL HIGH (ref 70–99)
Glucose-Capillary: 131 mg/dL — ABNORMAL HIGH (ref 70–99)
Glucose-Capillary: 135 mg/dL — ABNORMAL HIGH (ref 70–99)
Glucose-Capillary: 127 mg/dL — ABNORMAL HIGH (ref 70–99)

## 2020-11-19 LAB — HEMOGLOBIN AND HEMATOCRIT, BLOOD
HCT: 21.4 % — ABNORMAL LOW (ref 39.0–52.0)
Hemoglobin: 7.1 g/dL — ABNORMAL LOW (ref 13.0–17.0)

## 2020-11-19 LAB — PREPARE RBC (CROSSMATCH)

## 2020-11-19 LAB — MAGNESIUM: Magnesium: 1.5 mg/dL — ABNORMAL LOW (ref 1.7–2.4)

## 2020-11-19 MED ORDER — SODIUM CHLORIDE 0.9% IV SOLUTION
Freq: Once | INTRAVENOUS | Status: DC
Start: 2020-11-19 — End: 2020-11-23

## 2020-11-19 MED ORDER — MAGNESIUM SULFATE 2 GM/50ML IV SOLN
2.0000 g | Freq: Once | INTRAVENOUS | Status: AC
  Administered 2020-11-19: 2 g via INTRAVENOUS
  Filled 2020-11-19: qty 50

## 2020-11-19 NOTE — Progress Notes (Signed)
Brief cardiology note: Contacted by Dr. Starla Link that patient continues to have bleeding from amputation site with anemia requiring transfusion. He is s/p PCI 03/22/20. As an outpatient he was on dual antiplatelet therapy with aspirin and ticagrelor. Given serious bleeding, recommend continuing aspirin and holding ticagrelor until bleeding resolves and H/H stable. Can restart when clear from a surgical standpoint to complete 12 mos post PCI dual antiplatelet therapy.  Keith Dresser, MD, PhD, [place]  [address] [place], [place] [address] [phone]

## 2020-11-19 NOTE — Progress Notes (Signed)
Orthopedic Tech Progress Note Patient Details:  Keith [name] 07-22-1965 [name]  Called in order to [facility] for a VIVE PROTOCOL BK   [name], male   DOB: Sep 02, 1965, 55 y.o.   MRN: [name]  Keith [name] 11/19/2020, 9:17 AM

## 2020-11-19 NOTE — Plan of Care (Signed)
  Problem: Skin Integrity: Goal: Skin integrity will improve Outcome: Progressing   Problem: Nutrition: Goal: Adequate nutrition will be maintained Outcome: Progressing   Problem: Pain Managment: Goal: General experience of comfort will improve Outcome: Progressing

## 2020-11-19 NOTE — Progress Notes (Signed)
This RN was notified by lab of a critical Lab result. Hgb 6.0 MD paged, will continue to monitor.  Aurther Loft, RN

## 2020-11-19 NOTE — Progress Notes (Signed)
[name], male   DOB: 1965/08/30, 55 y.o.   MRN: [name]  PROGRESS NOTE    Keith [name]  [id number] DOB: Aug 08, 1965 DOA: 10/17/2020 [name], NP   Brief Narrative:   55 year old male with past medical history of HIV, chronic kidney disease stage IIIb, type 2 diabetes mellitus, hypertension, hyperlipidemia, coronary artery disease, status post right below knee amputation who was sent to the emergency department by his orthopedic surgeon due to concern for left heel ulcer that had failed outpatient therapy.  Subsequently, MRI confirmed osteomyelitis.  He underwent left transtibial amputation by [name]. During the hospitalization, he had worsening renal function.  Nephrology was consulted.  He had temporary dialysis catheter placed and dialysis was started.  Later, he developed hematuria and required fulguration and CBI along with blood transfusions.  Renal function improved; dialysis was stopped and nephrology signed off. Subsequently, his wound dehisced with bleeding requiring blood transfusion.  Assessment & Plan:   Acute kidney injury, metabolic acidosis Urinary retention -Patient was started on dialysis during this admission but was subsequently stopped and nephrology signed off.  AKI was felt to be due to ATN.  Tunneled dialysis catheter has already been removed -Renal ultrasound was negative for hydronephrosis -Status post Foley catheter placement on 11/08/2020 with clots: Urology was consulted.  Underwent cystoscopy with fulguration on 11/09/2020 and started on CBI and Proscar.  Subsequently CBI was discontinued -Creatinine was improving but has worsened over the last 2 days.  Creatinine 2.05 on 11/14/2020 and worsened to 3.15 on 11/15/2020.  Creatinine 2.30 today. Currently on gentle hydration -Hematuria improving  Anemia due to acute blood loss from bladder and left stump -Hemoglobin 6.5 on 11/14/2020 for which patient was transfused 2 more units packed red  cells.   -Hemoglobin 6 today.  Transfuse 1 unit of packed red cells -Patient has had multiple units of packed red cell transfusion during this hospitalization. -Monitor H&H.  Left heel osteomyelitis and abscess status post transtibial amputation on 10/27/2020 with subsequent dehiscence of wound with bleeding -Patient has had significant bleeding from the wound with wound dehiscence requiring blood transfusions. -Orthopedics following: Status post revision of left below-knee amputation on 11/17/2020 by [name]  CAD status post stenting in 03/2020 Dyslipidemia -Currently still on aspirin and Brilinta.  We will ask cardiology to weigh in regarding the same.  Continue Vascepa and Lipitor  Hyperkalemia -Resolved  HIV -Continue current regimen of antiretrovirals and dapsone as per ID.  Outpatient follow-up with ID  Diabetes mellitus type 2 with hyperglycemia -Continue CBGs with SSI.  A1c 13.9 on 08/29/2020.  DVT prophylaxis: SCDs Code Status: Full Family Communication: None at bedside Disposition Plan: Status is: Inpatient  Remains inpatient appropriate because:Inpatient level of care appropriate due to severity of illness  Dispo:  Patient From: Home  Planned Disposition: SNF  Medically stable for discharge: No   Consultants: Nephrology/ID/urology/Ortho  Procedures: Dialysis cath placement and subsequent removal; left transtibial amputation Revision of left below-knee amputation on 11/17/2020 by [name]  Antimicrobials:  Anti-infectives (From admission, onward)    Start     Dose/Rate Route Frequency Ordered Stop   11/18/20 0100  ceFAZolin (ANCEF) IVPB 2g/100 mL premix        2 g 200 mL/hr over 30 Minutes Intravenous Every 8 hours 11/17/20 2045 11/18/20 1038   11/17/20 0600  ceFAZolin (ANCEF) IVPB 3g/100 mL premix        3 g 200 mL/hr over 30 Minutes Intravenous On call  to O.R. 11/16/20 1941 11/17/20 1901   11/11/20 1200  vancomycin (VANCOREADY) IVPB 500 mg/100 mL  Status:   Discontinued        500 mg 100 mL/hr over 60 Minutes Intravenous Every T-Th-Sa (Hemodialysis) 11/09/20 1414 11/11/20 1328   11/10/20 0000  Dolutegravir-lamiVUDine (DOVATO) 50-300 MG TABS        1 tablet Oral Daily 11/10/20 1048     11/10/20 0000  doravirine (PIFELTRO) 100 MG TABS tablet        100 mg Oral Daily 11/10/20 1048     11/08/20 1000  dolutegravir (TIVICAY) tablet 50 mg        50 mg Oral Daily 11/07/20 1451     11/08/20 1000  lamiVUDine (EPIVIR) tablet 300 mg        300 mg Oral Daily 11/07/20 1451     11/08/20 1000  doravirine (PIFELTRO) tablet 100 mg        100 mg Oral Daily 11/07/20 1451     11/02/20 1130  ceFAZolin (ANCEF) IVPB 2g/100 mL premix        2 g 200 mL/hr over 30 Minutes Intravenous To Radiology 11/02/20 1042 11/02/20 1704   10/27/20 1245  ceFAZolin (ANCEF) IVPB 3g/100 mL premix        3 g 200 mL/hr over 30 Minutes Intravenous On call to O.R. 10/27/20 0755 10/27/20 1608   10/20/20 1415  linezolid (ZYVOX) tablet 600 mg  Status:  Discontinued        600 mg Oral Every 12 hours 10/20/20 1326 10/28/20 1115   10/19/20 1445  metroNIDAZOLE (FLAGYL) tablet 500 mg  Status:  Discontinued        500 mg Oral Every 12 hours 10/19/20 1347 10/28/20 1115   10/18/20 1600  ceFEPIme (MAXIPIME) 2 g in sodium chloride 0.9 % 100 mL IVPB  Status:  Discontinued        2 g 200 mL/hr over 30 Minutes Intravenous Every 12 hours 10/18/20 0414 10/28/20 1112   10/18/20 1000  dapsone tablet 100 mg        100 mg Oral Daily 10/18/20 0425     10/18/20 1000  bictegravir-emtricitabine-tenofovir AF (BIKTARVY) 50-200-25 MG per tablet 1 tablet  Status:  Discontinued        1 tablet Oral Daily 10/18/20 0610 11/07/20 1451   10/18/20 0415  metroNIDAZOLE (FLAGYL) IVPB 500 mg  Status:  Discontinued        500 mg 100 mL/hr over 60 Minutes Intravenous Every 8 hours 10/18/20 0404 10/19/20 1347   10/18/20 0415  ceFEPIme (MAXIPIME) 2 g in sodium chloride 0.9 % 100 mL IVPB        2 g 200 mL/hr over 30 Minutes  Intravenous  Once 10/18/20 0414 10/18/20 0500   10/18/20 0345  vancomycin (VANCOREADY) IVPB 2000 mg/400 mL        2,000 mg 200 mL/hr over 120 Minutes Intravenous  Once 10/18/20 0335 10/18/20 [id number]         Subjective: Patient seen and examined at bedside.  Poor historian.  No overnight fever, vomiting, worsening shortness of breath reported.   Objective: Vitals:   11/18/20 1120 11/18/20 1818 11/18/20 2119 11/19/20 0548  BP: 121/68 107/74 115/73 125/79  Pulse: 94 86 78 77  Resp: '19  18 18  '$ Temp: 98.7 F (37.1 C) 99.5 F (37.5 C) 98.7 F (37.1 C) 98.7 F (37.1 C)  TempSrc: Oral Oral Oral Oral  SpO2: 100% 100% 98% 98%  Weight:   117.3  kg   Height:        Intake/Output Summary (Last 24 hours) at 11/19/2020 0802 Last data filed at 11/19/2020 0550 Gross per 24 hour  Intake --  Output 800 ml  Net -800 ml    Filed Weights   11/09/20 2129 11/15/20 2116 11/18/20 2119  Weight: 124.7 kg 124.7 kg 117.3 kg    Examination:  General exam: Currently on room air.  No acute distress.  Looks chronically ill and deconditioned.  Respiratory system: Bilateral decreased breath sounds at bases with scattered crackles  cardiovascular system: S1-S2 heard; rate controlled gastrointestinal system: Abdomen is distended mildly, soft and nontender.  Bowel sounds are heard extremities: Right BKA present, has left BKA with wound VAC with bloody drainage present. Central nervous system: Still very slow to respond.  Poor historian.  No obvious neurological deficits skin: No obvious ecchymosis/other lesions  psychiatry: Affect is very flat  Data Reviewed: I have personally reviewed following labs and imaging studies  CBC: Recent Labs  Lab 11/14/20 1104 11/15/20 0557 11/16/20 0320 11/17/20 0353 11/19/20 0653  WBC 14.8* 11.0* 9.1 9.1 11.5*  NEUTROABS  --   --  6.3 6.2  --   HGB 6.5* 7.6* 11.6* 7.5* 6.0*  HCT 20.8* 23.6* 35.8* 23.1* 18.3*  MCV 91.2 89.7 89.3 90.2 91.0  PLT 236 230 200 284 249     Basic Metabolic Panel: Recent Labs  Lab 11/14/20 0429 11/15/20 0557 11/16/20 0320 11/17/20 0353 11/19/20 0653  NA 136 134* 136 137 133*  K 4.0 3.6 4.2 4.0 3.9  CL 104 104 108 107 105  CO2 25 23 21* 23 21*  GLUCOSE 171* 142* 153* 114* 119*  BUN 17 25* 24* 19 22*  CREATININE 2.05* 3.15* 2.50* 2.15* 2.30*  CALCIUM 8.6* 7.9* 8.1* 8.4* 7.7*  MG  --   --  1.6*  --  1.5*  PHOS 3.5 3.9 3.7 3.6 3.3    GFR: Estimated Creatinine Clearance: 46 mL/min (A) (by C-G formula based on SCr of 2.3 mg/dL (H)). Liver Function Tests: Recent Labs  Lab 11/14/20 0429 11/15/20 0557 11/16/20 0320 11/17/20 0353 11/19/20 0653  ALBUMIN 2.1* 2.1* 2.2* 2.3* 2.0*    No results for input(s): LIPASE, AMYLASE in the last 168 hours. No results for input(s): AMMONIA in the last 168 hours. Coagulation Profile: No results for input(s): INR, PROTIME in the last 168 hours. Cardiac Enzymes: No results for input(s): CKTOTAL, CKMB, CKMBINDEX, TROPONINI in the last 168 hours. BNP (last 3 results) No results for input(s): PROBNP in the last 8760 hours. HbA1C: No results for input(s): HGBA1C in the last 72 hours. CBG: Recent Labs  Lab 11/18/20 1118 11/18/20 1146 11/18/20 1620 11/18/20 2115 11/19/20 0635  GLUCAP 133* 139* 207* 152* 113*    Lipid Profile: No results for input(s): CHOL, HDL, LDLCALC, TRIG, CHOLHDL, LDLDIRECT in the last 72 hours. Thyroid Function Tests: No results for input(s): TSH, T4TOTAL, FREET4, T3FREE, THYROIDAB in the last 72 hours. Anemia Panel: No results for input(s): VITAMINB12, FOLATE, FERRITIN, TIBC, IRON, RETICCTPCT in the last 72 hours. Sepsis Labs: No results for input(s): PROCALCITON, LATICACIDVEN in the last 168 hours.  No results found for this or any previous visit (from the past 240 hour(s)).       Radiology Studies: No results found.      Scheduled Meds:  sodium chloride   Intravenous Once   allopurinol  100 mg Oral Daily   vitamin C  1,000 mg  Oral Daily   aspirin  EC  81 mg Oral Daily   atorvastatin  80 mg Oral Daily   Chlorhexidine Gluconate Cloth  6 each Topical Q0600   dapsone  100 mg Oral Daily   docusate sodium  100 mg Oral Daily   dolutegravir  50 mg Oral Daily   doravirine  100 mg Oral Daily   escitalopram  20 mg Oral Daily   finasteride  5 mg Oral Daily   gabapentin  100 mg Oral TID   icosapent Ethyl  2 g Oral BID   insulin aspart  0-15 Units Subcutaneous TID WC   insulin aspart  0-5 Units Subcutaneous QHS   lamiVUDine  300 mg Oral Daily   nutrition supplement (JUVEN)  1 packet Oral BID BM   oxybutynin  2.5 mg Oral BID   oxyCODONE  10 mg Oral QID   pantoprazole  40 mg Oral Daily   senna  2 tablet Oral QHS   ticagrelor  90 mg Oral BID   zinc sulfate  220 mg Oral Daily   Continuous Infusions:  sodium chloride Stopped (11/17/20 1253)   sodium chloride 75 mL/hr at 11/18/20 1138   magnesium sulfate bolus IVPB            Aline August, MD [facility] 11/19/2020, 8:02 AM

## 2020-11-20 DIAGNOSIS — N179 Acute kidney failure, unspecified: Secondary | ICD-10-CM | POA: Diagnosis not present

## 2020-11-20 DIAGNOSIS — M86172 Other acute osteomyelitis, left ankle and foot: Secondary | ICD-10-CM | POA: Diagnosis not present

## 2020-11-20 DIAGNOSIS — B20 Human immunodeficiency virus [HIV] disease: Secondary | ICD-10-CM | POA: Diagnosis not present

## 2020-11-20 LAB — CBC WITH DIFFERENTIAL/PLATELET
Abs Immature Granulocytes: 0.07 10*3/uL (ref 0.00–0.07)
Basophils Absolute: 0 10*3/uL (ref 0.0–0.1)
Basophils Relative: 0 %
Eosinophils Absolute: 0.3 10*3/uL (ref 0.0–0.5)
Eosinophils Relative: 2 %
HCT: 20.7 % — ABNORMAL LOW (ref 39.0–52.0)
Hemoglobin: 6.9 g/dL — CL (ref 13.0–17.0)
Immature Granulocytes: 1 %
Lymphocytes Relative: 14 %
Lymphs Abs: 1.6 10*3/uL (ref 0.7–4.0)
MCH: 30.1 pg (ref 26.0–34.0)
MCHC: 33.3 g/dL (ref 30.0–36.0)
MCV: 90.4 fL (ref 80.0–100.0)
Monocytes Absolute: 0.9 10*3/uL (ref 0.1–1.0)
Monocytes Relative: 8 %
Neutro Abs: 8.4 10*3/uL — ABNORMAL HIGH (ref 1.7–7.7)
Neutrophils Relative %: 75 %
Platelets: 294 10*3/uL (ref 150–400)
RBC: 2.29 MIL/uL — ABNORMAL LOW (ref 4.22–5.81)
RDW: 14.2 % (ref 11.5–15.5)
WBC: 11.2 10*3/uL — ABNORMAL HIGH (ref 4.0–10.5)
nRBC: 0 % (ref 0.0–0.2)

## 2020-11-20 LAB — GLUCOSE, CAPILLARY
Glucose-Capillary: 116 mg/dL — ABNORMAL HIGH (ref 70–99)
Glucose-Capillary: 130 mg/dL — ABNORMAL HIGH (ref 70–99)
Glucose-Capillary: 122 mg/dL — ABNORMAL HIGH (ref 70–99)
Glucose-Capillary: 120 mg/dL — ABNORMAL HIGH (ref 70–99)

## 2020-11-20 LAB — RENAL FUNCTION PANEL
Albumin: 2.1 g/dL — ABNORMAL LOW (ref 3.5–5.0)
Anion gap: 7 (ref 5–15)
BUN: 20 mg/dL (ref 6–20)
CO2: 21 mmol/L — ABNORMAL LOW (ref 22–32)
Calcium: 8.1 mg/dL — ABNORMAL LOW (ref 8.9–10.3)
Chloride: 107 mmol/L (ref 98–111)
Creatinine, Ser: 2.08 mg/dL — ABNORMAL HIGH (ref 0.61–1.24)
GFR, Estimated: 37 mL/min — ABNORMAL LOW (ref >60)
Glucose, Bld: 113 mg/dL — ABNORMAL HIGH (ref 70–99)
Phosphorus: 3.6 mg/dL (ref 2.5–4.6)
Potassium: 4 mmol/L (ref 3.5–5.1)
Sodium: 135 mmol/L (ref 135–145)

## 2020-11-20 LAB — MAGNESIUM: Magnesium: 1.8 mg/dL (ref 1.7–2.4)

## 2020-11-20 LAB — PREPARE RBC (CROSSMATCH)

## 2020-11-20 MED ORDER — SODIUM CHLORIDE 0.9% IV SOLUTION: Freq: Once | INTRAVENOUS | Status: DC

## 2020-11-20 NOTE — Progress Notes (Signed)
Status post below-knee amputation revision.  Patient is comfortable doing well   He does have 250 cc of serous sanguinous drainage in the canister.  PT to evaluate today

## 2020-11-20 NOTE — Progress Notes (Signed)
Physical Therapy Treatment Patient Details Name: Keith [name] MRN: [name] DOB: Dec 11, 1965 Today's Date: 11/20/2020    History of Present Illness Keith [name] is a 55 y.o. male  who is sent to the ED by his orthopedic surgery provider due to concern for left heel ulcer that had failed outpatient therapy. with medical history significant for HIV, CKD 3B, type 2 diabetes, hypertension, hyperlipidemia, coronary artery disease, status post right below the knee amputation, now s/p L BKA 10/27/20.    PT Comments    Pt reports he is dirty on entry and needs to be washed up. Linens are dirty so suggested that pt transfer to recliner so bed linens can be changed. Pt require modA for rolling to be cleaned up. Min guard to come to longsitting and modAx2 for pad assist for lateral scoot from bed to recliner. After transfer completed pt request to do exercises. D/c plan remains appropriate. PT will continue to follow acutely.    Follow Up Recommendations  SNF     Equipment Recommendations  Wheelchair (measurements PT);Wheelchair cushion (measurements PT)       Precautions / Restrictions Precautions Precautions: Fall;Other (comment) (Wound Vac L LE) Precaution Comments: B BKA, prosthesis for Right, bladder irrigation Required Braces or Orthoses: Other Brace Other Brace: limb protector on left Restrictions Weight Bearing Restrictions: Yes Other Position/Activity Restrictions: NWB on left    Mobility  Bed Mobility Overal bed mobility: Needs Assistance Bed Mobility: Supine to Sit Rolling: Mod assist;+2 for physical assistance   Supine to sit: +2 for physical assistance;Min guard     General bed mobility comments: HOB elevated,pt able to come to long sitting with no outside assist    Transfers Overall transfer level: Needs assistance   Transfers: Lateral/Scoot Transfers          Lateral/Scoot Transfers: +2 safety/equipment;Mod assist General transfer comment: modA for lateral  scoot transfer from bed to recliner, vc for hand placements and weight shifts      Balance Overall balance assessment: Needs assistance Sitting-balance support: Bilateral upper extremity supported;Feet unsupported Sitting balance-Leahy Scale: Fair                                      Cognition Arousal/Alertness: Awake/alert Behavior During Therapy: Flat affect;WFL for tasks assessed/performed Overall Cognitive Status: No family/caregiver present to determine baseline cognitive functioning Area of Impairment: Safety/judgement                       Following Commands: Follows multi-step commands with increased time;Follows one step commands consistently Safety/Judgement: Decreased awareness of safety   Problem Solving: Requires verbal cues;Requires tactile cues General Comments: pt more participatory today asks to do exercises      Exercises Amputee Exercises Knee Flexion: AROM;Right;5 reps;Seated;Left;AAROM Knee Extension: AROM;Right;5 reps;Seated;Left;AAROM Straight Leg Raises: AROM;Right;5 reps;Seated;Left;AAROM    General Comments General comments (skin integrity, edema, etc.): Wound vac in place over shrinker sock      Pertinent Vitals/Pain Pain Assessment: Faces Faces Pain Scale: Hurts a little bit Pain Location: L LE with transfer Pain Descriptors / Indicators: Discomfort Pain Intervention(s): Monitored during session     PT Goals (current goals can now be found in the care plan section) Acute Rehab PT Goals Patient Stated Goal: to get left prosthetic, to walk again PT Goal Formulation: With patient Time For Goal Achievement: 11/10/20 Potential to Achieve Goals: Fair Progress towards PT  goals: Progressing toward goals    Frequency    Min 3X/week      PT Plan Current plan remains appropriate       AM-PAC PT "6 Clicks" Mobility   Outcome Measure  Help needed turning from your back to your side while in a flat bed without  using bedrails?: None Help needed moving from lying on your back to sitting on the side of a flat bed without using bedrails?: A Little Help needed moving to and from a bed to a chair (including a wheelchair)?: A Little Help needed standing up from a chair using your arms (e.g., wheelchair or bedside chair)?: A Lot Help needed to walk in hospital room?: Total Help needed climbing 3-5 steps with a railing? : Total 6 Click Score: 14    End of Session   Activity Tolerance: Patient tolerated treatment well Patient left: with call bell/phone within reach;in chair;with chair alarm set Nurse Communication: Mobility status PT Visit Diagnosis: Unsteadiness on feet (R26.81);Other abnormalities of gait and mobility (R26.89);Pain;Muscle weakness (generalized) (M62.81) Pain - Right/Left: Left Pain - part of body: Leg     Time: [id number] PT Time Calculation (min) (ACUTE ONLY): 17 min  Charges:  $Therapeutic Activity: 8-22 mins                     Arion Morgan B. Migdalia Dk PT, DPT Acute Rehabilitation Services Pager [phone] Office [phone]    [place] 11/20/2020, 5:06 PM

## 2020-11-20 NOTE — Progress Notes (Signed)
[name], male   DOB: August 04, 1965, 55 y.o.   MRN: [name]  PROGRESS NOTE    Keith [name]  [id number] DOB: 1965-11-05 DOA: 10/17/2020 [name], NP   Brief Narrative:   55 year old male with past medical history of HIV, chronic kidney disease stage IIIb, type 2 diabetes mellitus, hypertension, hyperlipidemia, coronary artery disease, status post right below knee amputation who was sent to the emergency department by his orthopedic surgeon due to concern for left heel ulcer that had failed outpatient therapy.  Subsequently, MRI confirmed osteomyelitis.  He underwent left transtibial amputation by [name]. During the hospitalization, he had worsening renal function.  Nephrology was consulted.  He had temporary dialysis catheter placed and dialysis was started.  Later, he developed hematuria and required fulguration and CBI along with blood transfusions.  Renal function improved; dialysis was stopped and nephrology signed off. Subsequently, his wound dehisced with bleeding requiring blood transfusion.  Assessment & Plan:   Acute kidney injury, metabolic acidosis Urinary retention -Patient was started on dialysis during this admission but was subsequently stopped and nephrology signed off.  AKI was felt to be due to ATN.  Tunneled dialysis catheter has already been removed -Renal ultrasound was negative for hydronephrosis -Status post Foley catheter placement on 11/08/2020 with clots: Urology was consulted.  Underwent cystoscopy with fulguration on 11/09/2020 and started on CBI and Proscar.  Subsequently CBI was discontinued -Creatinine was improving but has worsened over the last 2 days.  Creatinine 2.05 on 11/14/2020 and worsened to 3.15 on 11/15/2020.  Creatinine 2.08 today.  DC IV fluids. -Hematuria improving  Anemia due to acute blood loss from bladder and left stump -Hemoglobin 6.5 on 11/14/2020 for which patient was transfused 2 more units packed red cells.    -Hemoglobin 6 on 11/19/2020; status post 1 more unit of packed red cell transfusion.  Hemoglobin 7.1 last night.  Will check stat H&H this morning. -Patient has had multiple units of packed red cell transfusion during this hospitalization. -Monitor H&H.  Left heel osteomyelitis and abscess status post transtibial amputation on 10/27/2020 with subsequent dehiscence of wound with bleeding -Patient has had significant bleeding from the wound with wound dehiscence requiring blood transfusions. -Orthopedics following: Status post revision of left below-knee amputation on 11/17/2020 by [name]  CAD status post stenting in 03/2020 Dyslipidemia -Currently still on aspirin.  Brilinta discontinued for now since 11/19/2020 as per cardiology recommendations.  Once bleeding stabilizes, will resume Brilinta.  Continue Vascepa and Lipitor  Hyperkalemia -Resolved  HIV -Continue current regimen of antiretrovirals and dapsone as per ID.  Outpatient follow-up with ID  Diabetes mellitus type 2 with hyperglycemia -Continue CBGs with SSI.  A1c 13.9 on 08/29/2020.  DVT prophylaxis: SCDs Code Status: Full Family Communication: None at bedside Disposition Plan: Status is: Inpatient  Remains inpatient appropriate because:Inpatient level of care appropriate due to severity of illness  Dispo:  Patient From: Home  Planned Disposition: SNF  Medically stable for discharge: No   Consultants: Nephrology/ID/urology/Ortho  Procedures: Dialysis cath placement and subsequent removal; left transtibial amputation Revision of left below-knee amputation on 11/17/2020 by [name]  Antimicrobials:  Anti-infectives (From admission, onward)    Start     Dose/Rate Route Frequency Ordered Stop   11/18/20 0100  ceFAZolin (ANCEF) IVPB 2g/100 mL premix        2 g 200 mL/hr over 30 Minutes Intravenous Every 8 hours 11/17/20 2045 11/18/20 1038   11/17/20 0600  ceFAZolin (ANCEF) IVPB  3g/100 mL premix        3 g 200 mL/hr  over 30 Minutes Intravenous On call to O.R. 11/16/20 1941 11/17/20 1901   11/11/20 1200  vancomycin (VANCOREADY) IVPB 500 mg/100 mL  Status:  Discontinued        500 mg 100 mL/hr over 60 Minutes Intravenous Every T-Th-Sa (Hemodialysis) 11/09/20 1414 11/11/20 1328   11/10/20 0000  Dolutegravir-lamiVUDine (DOVATO) 50-300 MG TABS        1 tablet Oral Daily 11/10/20 1048     11/10/20 0000  doravirine (PIFELTRO) 100 MG TABS tablet        100 mg Oral Daily 11/10/20 1048     11/08/20 1000  dolutegravir (TIVICAY) tablet 50 mg        50 mg Oral Daily 11/07/20 1451     11/08/20 1000  lamiVUDine (EPIVIR) tablet 300 mg        300 mg Oral Daily 11/07/20 1451     11/08/20 1000  doravirine (PIFELTRO) tablet 100 mg        100 mg Oral Daily 11/07/20 1451     11/02/20 1130  ceFAZolin (ANCEF) IVPB 2g/100 mL premix        2 g 200 mL/hr over 30 Minutes Intravenous To Radiology 11/02/20 1042 11/02/20 1704   10/27/20 1245  ceFAZolin (ANCEF) IVPB 3g/100 mL premix        3 g 200 mL/hr over 30 Minutes Intravenous On call to O.R. 10/27/20 0755 10/27/20 1608   10/20/20 1415  linezolid (ZYVOX) tablet 600 mg  Status:  Discontinued        600 mg Oral Every 12 hours 10/20/20 1326 10/28/20 1115   10/19/20 1445  metroNIDAZOLE (FLAGYL) tablet 500 mg  Status:  Discontinued        500 mg Oral Every 12 hours 10/19/20 1347 10/28/20 1115   10/18/20 1600  ceFEPIme (MAXIPIME) 2 g in sodium chloride 0.9 % 100 mL IVPB  Status:  Discontinued        2 g 200 mL/hr over 30 Minutes Intravenous Every 12 hours 10/18/20 0414 10/28/20 1112   10/18/20 1000  dapsone tablet 100 mg        100 mg Oral Daily 10/18/20 0425     10/18/20 1000  bictegravir-emtricitabine-tenofovir AF (BIKTARVY) 50-200-25 MG per tablet 1 tablet  Status:  Discontinued        1 tablet Oral Daily 10/18/20 0610 11/07/20 1451   10/18/20 0415  metroNIDAZOLE (FLAGYL) IVPB 500 mg  Status:  Discontinued        500 mg 100 mL/hr over 60 Minutes Intravenous Every 8 hours  10/18/20 0404 10/19/20 1347   10/18/20 0415  ceFEPIme (MAXIPIME) 2 g in sodium chloride 0.9 % 100 mL IVPB        2 g 200 mL/hr over 30 Minutes Intravenous  Once 10/18/20 0414 10/18/20 0500   10/18/20 0345  vancomycin (VANCOREADY) IVPB 2000 mg/400 mL        2,000 mg 200 mL/hr over 120 Minutes Intravenous  Once 10/18/20 0335 10/18/20 [id number]         Subjective: Patient seen and examined at bedside.  Poor historian.  No vomiting, worsening shortness of breath, fever reported.   Objective: Vitals:   11/19/20 1445 11/19/20 1730 11/19/20 2052 11/20/20 0618  BP: 135/72 (!) 170/92 118/77 118/75  Pulse: 90 88 87 78  Resp: '18 19 16 16  '$ Temp: 98.7 F (37.1 C) 99.4 F (37.4 C) 98.8 F (37.1 C) 99.6  F (37.6 C)  TempSrc: Oral Oral Oral Oral  SpO2: 99% 100% 98% 98%  Weight:    117.3 kg  Height:        Intake/Output Summary (Last 24 hours) at 11/20/2020 0812 Last data filed at 11/20/2020 0600 Gross per 24 hour  Intake 1084 ml  Output 1850 ml  Net -766 ml    Filed Weights   11/15/20 2116 11/18/20 2119 11/20/20 0618  Weight: 124.7 kg 117.3 kg 117.3 kg    Examination:  General exam: No distress.  Still on room air.  Looks chronically ill and deconditioned.  Respiratory system: Decreased breath sounds at bases bilaterally with some crackles  cardiovascular system: Rate controlled, S1-S2 heard  gastrointestinal system: Abdomen is slightly distended, soft and nontender.  Normal bowel sounds heard extremities: Right BKA present, left BKA with wound VAC with still has bloody drainage.   Central nervous system: Extremely slow to respond.  Poor historian.  No obvious neurological deficits skin: No obvious petechiae/other rashes  psychiatry: Extremely flat affect.  Does not participate in conversation much.  Data Reviewed: I have personally reviewed following labs and imaging studies  CBC: Recent Labs  Lab 11/14/20 1104 11/15/20 0557 11/16/20 0320 11/17/20 0353 11/19/20 0653  11/19/20 2010  WBC 14.8* 11.0* 9.1 9.1 11.5*  --   NEUTROABS  --   --  6.3 6.2  --   --   HGB 6.5* 7.6* 11.6* 7.5* 6.0* 7.1*  HCT 20.8* 23.6* 35.8* 23.1* 18.3* 21.4*  MCV 91.2 89.7 89.3 90.2 91.0  --   PLT 236 230 200 284 249  --     Basic Metabolic Panel: Recent Labs  Lab 11/15/20 0557 11/16/20 0320 11/17/20 0353 11/19/20 0653 11/20/20 0454  NA 134* 136 137 133* 135  K 3.6 4.2 4.0 3.9 4.0  CL 104 108 107 105 107  CO2 23 21* 23 21* 21*  GLUCOSE 142* 153* 114* 119* 113*  BUN 25* 24* 19 22* 20  CREATININE 3.15* 2.50* 2.15* 2.30* 2.08*  CALCIUM 7.9* 8.1* 8.4* 7.7* 8.1*  MG  --  1.6*  --  1.5* 1.8  PHOS 3.9 3.7 3.6 3.3 3.6    GFR: Estimated Creatinine Clearance: 50.9 mL/min (A) (by C-G formula based on SCr of 2.08 mg/dL (H)). Liver Function Tests: Recent Labs  Lab 11/15/20 0557 11/16/20 0320 11/17/20 0353 11/19/20 0653 11/20/20 0454  ALBUMIN 2.1* 2.2* 2.3* 2.0* 2.1*    No results for input(s): LIPASE, AMYLASE in the last 168 hours. No results for input(s): AMMONIA in the last 168 hours. Coagulation Profile: No results for input(s): INR, PROTIME in the last 168 hours. Cardiac Enzymes: No results for input(s): CKTOTAL, CKMB, CKMBINDEX, TROPONINI in the last 168 hours. BNP (last 3 results) No results for input(s): PROBNP in the last 8760 hours. HbA1C: No results for input(s): HGBA1C in the last 72 hours. CBG: Recent Labs  Lab 11/19/20 0635 11/19/20 1147 11/19/20 1703 11/19/20 2225 11/20/20 0702  GLUCAP 113* 131* 135* 127* 116*    Lipid Profile: No results for input(s): CHOL, HDL, LDLCALC, TRIG, CHOLHDL, LDLDIRECT in the last 72 hours. Thyroid Function Tests: No results for input(s): TSH, T4TOTAL, FREET4, T3FREE, THYROIDAB in the last 72 hours. Anemia Panel: No results for input(s): VITAMINB12, FOLATE, FERRITIN, TIBC, IRON, RETICCTPCT in the last 72 hours. Sepsis Labs: No results for input(s): PROCALCITON, LATICACIDVEN in the last 168 hours.  No  results found for this or any previous visit (from the past 240 hour(s)).  Radiology Studies: No results found.      Scheduled Meds:  sodium chloride   Intravenous Once   sodium chloride   Intravenous Once   allopurinol  100 mg Oral Daily   vitamin C  1,000 mg Oral Daily   aspirin EC  81 mg Oral Daily   atorvastatin  80 mg Oral Daily   Chlorhexidine Gluconate Cloth  6 each Topical Q0600   dapsone  100 mg Oral Daily   docusate sodium  100 mg Oral Daily   dolutegravir  50 mg Oral Daily   doravirine  100 mg Oral Daily   escitalopram  20 mg Oral Daily   finasteride  5 mg Oral Daily   gabapentin  100 mg Oral TID   icosapent Ethyl  2 g Oral BID   insulin aspart  0-15 Units Subcutaneous TID WC   insulin aspart  0-5 Units Subcutaneous QHS   lamiVUDine  300 mg Oral Daily   nutrition supplement (JUVEN)  1 packet Oral BID BM   oxybutynin  2.5 mg Oral BID   oxyCODONE  10 mg Oral QID   pantoprazole  40 mg Oral Daily   senna  2 tablet Oral QHS   zinc sulfate  220 mg Oral Daily   Continuous Infusions:  sodium chloride 50 mL/hr at 11/19/20 2158   magnesium sulfate bolus IVPB            Aline August, MD [facility] 11/20/2020, 8:12 AM

## 2020-11-20 NOTE — Plan of Care (Signed)
  Problem: Clinical Measurements: Goal: Cardiovascular complication will be avoided Outcome: Completed/Met   

## 2020-11-21 DIAGNOSIS — T8781 Dehiscence of amputation stump: Secondary | ICD-10-CM | POA: Diagnosis not present

## 2020-11-21 DIAGNOSIS — M86172 Other acute osteomyelitis, left ankle and foot: Secondary | ICD-10-CM | POA: Diagnosis not present

## 2020-11-21 DIAGNOSIS — B20 Human immunodeficiency virus [HIV] disease: Secondary | ICD-10-CM | POA: Diagnosis not present

## 2020-11-21 DIAGNOSIS — N179 Acute kidney failure, unspecified: Secondary | ICD-10-CM | POA: Diagnosis not present

## 2020-11-21 LAB — TYPE AND SCREEN
ABO/RH(D): O POS
Antibody Screen: NEGATIVE
Unit division: 0
Unit division: 0

## 2020-11-21 LAB — RENAL FUNCTION PANEL
Albumin: 2.1 g/dL — ABNORMAL LOW (ref 3.5–5.0)
Anion gap: 10 (ref 5–15)
BUN: 18 mg/dL (ref 6–20)
CO2: 21 mmol/L — ABNORMAL LOW (ref 22–32)
Calcium: 8.5 mg/dL — ABNORMAL LOW (ref 8.9–10.3)
Chloride: 103 mmol/L (ref 98–111)
Creatinine, Ser: 1.87 mg/dL — ABNORMAL HIGH (ref 0.61–1.24)
GFR, Estimated: 42 mL/min — ABNORMAL LOW (ref >60)
Glucose, Bld: 120 mg/dL — ABNORMAL HIGH (ref 70–99)
Phosphorus: 3.3 mg/dL (ref 2.5–4.6)
Potassium: 4 mmol/L (ref 3.5–5.1)
Sodium: 134 mmol/L — ABNORMAL LOW (ref 135–145)

## 2020-11-21 LAB — GLUCOSE, CAPILLARY
Glucose-Capillary: 120 mg/dL — ABNORMAL HIGH (ref 70–99)
Glucose-Capillary: 147 mg/dL — ABNORMAL HIGH (ref 70–99)
Glucose-Capillary: 135 mg/dL — ABNORMAL HIGH (ref 70–99)
Glucose-Capillary: 122 mg/dL — ABNORMAL HIGH (ref 70–99)

## 2020-11-21 LAB — CBC WITH DIFFERENTIAL/PLATELET
Abs Immature Granulocytes: 0.04 10*3/uL (ref 0.00–0.07)
Basophils Absolute: 0 10*3/uL (ref 0.0–0.1)
Basophils Relative: 0 %
Eosinophils Absolute: 0.4 10*3/uL (ref 0.0–0.5)
Eosinophils Relative: 4 %
HCT: 23.9 % — ABNORMAL LOW (ref 39.0–52.0)
Hemoglobin: 8.1 g/dL — ABNORMAL LOW (ref 13.0–17.0)
Immature Granulocytes: 0 %
Lymphocytes Relative: 15 %
Lymphs Abs: 1.4 10*3/uL (ref 0.7–4.0)
MCH: 30.8 pg (ref 26.0–34.0)
MCHC: 33.9 g/dL (ref 30.0–36.0)
MCV: 90.9 fL (ref 80.0–100.0)
Monocytes Absolute: 0.7 10*3/uL (ref 0.1–1.0)
Monocytes Relative: 7 %
Neutro Abs: 6.8 10*3/uL (ref 1.7–7.7)
Neutrophils Relative %: 74 %
Platelets: 349 10*3/uL (ref 150–400)
RBC: 2.63 MIL/uL — ABNORMAL LOW (ref 4.22–5.81)
RDW: 14 % (ref 11.5–15.5)
WBC: 9.4 10*3/uL (ref 4.0–10.5)
nRBC: 0 % (ref 0.0–0.2)

## 2020-11-21 LAB — BPAM RBC
Blood Product Expiration Date: 202209172359
Blood Product Expiration Date: 202209172359
ISSUE DATE / TIME: 202208141422
ISSUE DATE / TIME: 202208151639
Unit Type and Rh: 5100
Unit Type and Rh: 5100

## 2020-11-21 LAB — MAGNESIUM: Magnesium: 1.8 mg/dL (ref 1.7–2.4)

## 2020-11-21 MED ORDER — EMPAGLIFLOZIN 10 MG PO TABS
10.0000 mg | ORAL_TABLET | Freq: Every day | ORAL | Status: DC
  Administered 2020-11-21 – 2020-12-17 (×27): 10 mg via ORAL
  Filled 2020-11-21 (×29): qty 1

## 2020-11-21 NOTE — TOC Progression Note (Addendum)
Transition of Care [facility]) - Progression Note    Patient Details  Name: CODDY MANSIR MRN: [name] Date of Birth: Oct 08, 1965  [facility] of [facility] CM/SW Contact  Sharlet Salina Mila Homer, LCSW Phone Number: 11/21/2020, 4:53 PM  Clinical Narrative:  Talked with patient at the bedside regarding his discharge disposition. Mr. Matthes was informed that the SNF's that did not respond to the SNF request was contacted and messages were left for most of the facilities. Talked with [name] at [facility] and she is willing to come and talk with patient and she also informed CSW that corporate would have to approve patient. CSW explained to patient that the difficulty in finding placement is due to his only have Medicaid, and patient responded that he has Medicare, but does not have the card with him. CSW reviewed chart and it appears that he had [employer] Medicare at one time. CSW emailed a Development worker, community to determine if patient has Medicare. CSW will continue to follow and assist with SNF placement.   During conversation regarding SNF placement, patient advised that [place] made a bed offer. Mr. Bone commented that he has been to this facility before and had a bad experience and does not want to return there. Also talked with Mr. Deragon about [facility] and he responded that he would need to talk with his mom about going to [place], as she may not be comfortable travelling to [place]. Contact had been made with [name], admissions director at [facility] and she wanted to come to the hospital to talk with patient, however after he indicated needing to talk with his mother, CSW communicated with [name] that patient wants to talk with his mother first.      Expected Discharge Plan: Skilled Nursing Facility Barriers to Discharge: SNF Pending bed offer  Expected Discharge Plan and Services Expected Discharge Plan: [place] In-house Referral: Clinical Social  Work Discharge Planning Services: CM Consult Post Acute Care Choice: [place] arrangements for the past 2 months: Single Family Home                                       Social Determinants of Health (SDOH) Interventions  No SDOH interventions requested or needed at this time.  Readmission Risk Interventions Readmission Risk Prevention Plan 10/21/2020 11/13/2018 11/13/2018  Transportation Screening Complete - Complete  PCP or Specialist Appt within 3-5 Days - - -  [place]RI or [place] for [place] - - -  Medication Review Press photographer) Complete - Complete  PCP or Specialist appointment within 3-5 days of discharge Complete Complete -  [place] or Home Care Consult Complete Complete -  SW Recovery Care/Counseling Consult Complete Complete -  Palliative Care Screening Not Applicable Not Applicable -  [place] Complete Not Applicable -  Some encounter information is confidential and restricted. Go to Review Flowsheets activity to see all data.  Some recent data might be hidden

## 2020-11-21 NOTE — Progress Notes (Signed)
Occupational Therapy Treatment Patient Details Name: [name] MRN: [name] DOB: 11/25/1965 Today's Date: 11/21/2020    History of present illness Keith [name] is a 55 y.o. male  who is sent to the ED by his orthopedic surgery provider due to concern for left heel ulcer that had failed outpatient therapy. with medical history significant for HIV, CKD 3B, type 2 diabetes, hypertension, hyperlipidemia, coronary artery disease, status post right below the knee amputation, now s/p L BKA 10/27/20.   OT comments  Pt. Preformed ADLs sitting EOB. Pt. Balance was good during task. Pt. Leaned side to side for peri hygiene. Pt. Is improving with preforming ADLs. Pt. Was 1 person assist to scoot into recliner chair with arm rest down. Acute OT to follow.   Follow Up Recommendations  SNF    Equipment Recommendations  None recommended by OT    Recommendations for Other Services      Precautions / Restrictions Precautions Precautions: Fall Precaution Comments: B BKA, prosthesis for Right, bladder irrigation Required Braces or Orthoses: Other Brace Other Brace: limb protector on left Restrictions LLE Weight Bearing: Non weight bearing       Mobility Bed Mobility Overal bed mobility: Needs Assistance       Supine to sit: Min assist          Transfers     Transfers: Lateral/Scoot Transfers          Lateral/Scoot Transfers: Min assist      Balance     Sitting balance-Leahy Scale: Good                                     ADL either performed or assessed with clinical judgement   ADL Overall ADL's : Needs assistance/impaired Eating/Feeding: Set up;Sitting   Grooming: Wash/dry hands;Wash/dry face;Applying deodorant;Sitting   Upper Body Bathing: Set up;Sitting   Lower Body Bathing: Minimal assistance;Sitting/lateral leans   Upper Body Dressing : Minimal assistance;Sitting   Lower Body Dressing: Sitting/lateral leans;Maximal assistance    Toilet Transfer: Minimal assistance (scooting over)   Toileting- Clothing Manipulation and Hygiene: Moderate assistance;Sitting/lateral lean       Functional mobility during ADLs:  (min a to scoot into chair.) General ADL Comments: Pt. was able to sti eob with good balance for ADLs.     Vision   Vision Assessment?: No apparent visual deficits   Perception     Praxis      Cognition Arousal/Alertness: Awake/alert Behavior During Therapy: WFL for tasks assessed/performed Overall Cognitive Status: Within Functional Limits for tasks assessed                                          Exercises     Shoulder Instructions       General Comments      Pertinent Vitals/ Pain       Pain Assessment: 0-10 Pain Score: 9  Pain Location: l le Pain Descriptors / Indicators: Aching Pain Intervention(s): Patient requesting pain meds-RN notified  Home Living                                          Prior Functioning/Environment  Frequency  Min 2X/week        Progress Toward Goals  OT Goals(current goals can now be found in the care plan section)  Progress towards OT goals: Progressing toward goals  Acute Rehab OT Goals Patient Stated Goal: to get better OT Goal Formulation: With patient Time For Goal Achievement: 11/24/20 Potential to Achieve Goals: Good ADL Goals Pt Will Perform Lower Body Bathing: with min assist;sitting/lateral leans Pt Will Perform Upper Body Dressing: with set-up;sitting Pt Will Perform Lower Body Dressing: with min assist;sitting/lateral leans Pt Will Transfer to Toilet: with min assist;stand pivot transfer Pt Will Perform Toileting - Clothing Manipulation and hygiene: with set-up;sitting/lateral leans  Plan Discharge plan remains appropriate    Co-evaluation                 AM-PAC OT "6 Clicks" Daily Activity     Outcome Measure   Help from another person eating meals?: None Help  from another person taking care of personal grooming?: A Little Help from another person toileting, which includes using toliet, bedpan, or urinal?: A Lot Help from another person bathing (including washing, rinsing, drying)?: A Lot Help from another person to put on and taking off regular upper body clothing?: A Little Help from another person to put on and taking off regular lower body clothing?: A Lot 6 Click Score: 16    End of Session    OT Visit Diagnosis: Other abnormalities of gait and mobility (R26.89);Muscle weakness (generalized) (M62.81);Pain Pain - Right/Left: Left Pain - part of body: Leg   Activity Tolerance Patient tolerated treatment well;Patient limited by fatigue   Patient Left in chair;with call bell/phone within reach;with chair alarm set   Nurse Communication Patient requests pain meds        Time: 1209-1253 OT Time Calculation (min): 44 min  Charges: OT General Charges $OT Visit: 1 Visit OT Treatments $Self Care/Home Management : 23-37 mins $Therapeutic Activity: 8-22 mins [name] OT/L    Brynnlie Unterreiner 11/21/2020, 12:54 PM

## 2020-11-21 NOTE — Progress Notes (Signed)
[name], male   DOB: 1965/10/13, 55 y.o.   MRN: [name]  PROGRESS NOTE    Keith [name]  [id number] DOB: 03-Oct-1965 DOA: 10/17/2020 [name], NP   Brief Narrative:   55 year old male with past medical history of HIV, chronic kidney disease stage IIIb, type 2 diabetes mellitus, hypertension, hyperlipidemia, coronary artery disease, status post right below knee amputation who was sent to the emergency department by his orthopedic surgeon due to concern for left heel ulcer that had failed outpatient therapy.  Subsequently, MRI confirmed osteomyelitis.  He underwent left transtibial amputation by [name]. During the hospitalization, he had worsening renal function.  Nephrology was consulted.  He had temporary dialysis catheter placed and dialysis was started.  Later, he developed hematuria and required fulguration and CBI along with blood transfusions.  Renal function improved; dialysis was stopped and nephrology signed off. Subsequently, his wound dehisced with bleeding requiring blood transfusion.  He underwent left below-knee amputation revision by [name] on 11/17/2020.  Assessment & Plan:   Acute kidney injury, metabolic acidosis Urinary retention -Patient was started on dialysis during this admission but was subsequently stopped and nephrology signed off.  AKI was felt to be due to ATN.  Tunneled dialysis catheter has already been removed -Renal ultrasound was negative for hydronephrosis -Status post Foley catheter placement on 11/08/2020 with clots: Urology was consulted.  Underwent cystoscopy with fulguration on 11/09/2020 and started on CBI and Proscar.  Subsequently CBI was discontinued -Creatinine was improving but has worsened over the last 2 days.  Creatinine 2.05 on 11/14/2020 and worsened to 3.15 on 11/15/2020.  Creatinine 1.87 today.  DC'd IV fluids on 11/20/2020. -Foley catheter discontinued on 11/20/2020.  Hematuria has improved.  Anemia due to acute  blood loss from bladder and left stump -Hemoglobin 6.5 on 11/14/2020 for which patient was transfused 2 more units packed red cells.   -Hemoglobin 6 on 11/19/2020; status post 1 more unit of packed red cell transfusion.  Hemoglobin 6.9 on 11/20/2020.  Status post 1 more unit packed red cell transfusion on 11/20/2020. -Hemoglobin 8.1 today. -Patient has had multiple units of packed red cell transfusion during this hospitalization. -Monitor H&H.  Left heel osteomyelitis and abscess status post transtibial amputation on 10/27/2020 with subsequent dehiscence of wound with bleeding -Patient has had significant bleeding from the wound with wound dehiscence requiring blood transfusions. -Orthopedics following: Status post revision of left below-knee amputation on 11/17/2020 by [name].  Still has wound VAC.  Hyponatremia -Mild.  Monitor  CAD status post stenting in 03/2020 Dyslipidemia -Currently still on aspirin.  Brilinta discontinued for now since 11/19/2020 as per cardiology recommendations.  Once bleeding stabilizes, will resume Brilinta.  Continue Vascepa and Lipitor  Hyperkalemia -Resolved  HIV -Continue current regimen of antiretrovirals and dapsone as per ID.  Outpatient follow-up with ID  Diabetes mellitus type 2 with hyperglycemia -Continue CBGs with SSI.  A1c 13.9 on 08/29/2020.  DVT prophylaxis: SCDs Code Status: Full Family Communication: None at bedside Disposition Plan: Status is: Inpatient  Remains inpatient appropriate because:Inpatient level of care appropriate due to severity of illness  Dispo:  Patient From: Home  Planned Disposition: SNF  Medically stable for discharge: No   Consultants: Nephrology/ID/urology/Ortho  Procedures: Dialysis cath placement and subsequent removal; left transtibial amputation Revision of left below-knee amputation on 11/17/2020 by [name]  Antimicrobials:  Anti-infectives (From admission, onward)    Start     Dose/Rate Route Frequency  Ordered Stop  11/18/20 0100  ceFAZolin (ANCEF) IVPB 2g/100 mL premix        2 g 200 mL/hr over 30 Minutes Intravenous Every 8 hours 11/17/20 2045 11/18/20 1038   11/17/20 0600  ceFAZolin (ANCEF) IVPB 3g/100 mL premix        3 g 200 mL/hr over 30 Minutes Intravenous On call to O.R. 11/16/20 1941 11/17/20 1901   11/11/20 1200  vancomycin (VANCOREADY) IVPB 500 mg/100 mL  Status:  Discontinued        500 mg 100 mL/hr over 60 Minutes Intravenous Every T-Th-Sa (Hemodialysis) 11/09/20 1414 11/11/20 1328   11/10/20 0000  Dolutegravir-lamiVUDine (DOVATO) 50-300 MG TABS        1 tablet Oral Daily 11/10/20 1048     11/10/20 0000  doravirine (PIFELTRO) 100 MG TABS tablet        100 mg Oral Daily 11/10/20 1048     11/08/20 1000  dolutegravir (TIVICAY) tablet 50 mg        50 mg Oral Daily 11/07/20 1451     11/08/20 1000  lamiVUDine (EPIVIR) tablet 300 mg        300 mg Oral Daily 11/07/20 1451     11/08/20 1000  doravirine (PIFELTRO) tablet 100 mg        100 mg Oral Daily 11/07/20 1451     11/02/20 1130  ceFAZolin (ANCEF) IVPB 2g/100 mL premix        2 g 200 mL/hr over 30 Minutes Intravenous To Radiology 11/02/20 1042 11/02/20 1704   10/27/20 1245  ceFAZolin (ANCEF) IVPB 3g/100 mL premix        3 g 200 mL/hr over 30 Minutes Intravenous On call to O.R. 10/27/20 0755 10/27/20 1608   10/20/20 1415  linezolid (ZYVOX) tablet 600 mg  Status:  Discontinued        600 mg Oral Every 12 hours 10/20/20 1326 10/28/20 1115   10/19/20 1445  metroNIDAZOLE (FLAGYL) tablet 500 mg  Status:  Discontinued        500 mg Oral Every 12 hours 10/19/20 1347 10/28/20 1115   10/18/20 1600  ceFEPIme (MAXIPIME) 2 g in sodium chloride 0.9 % 100 mL IVPB  Status:  Discontinued        2 g 200 mL/hr over 30 Minutes Intravenous Every 12 hours 10/18/20 0414 10/28/20 1112   10/18/20 1000  dapsone tablet 100 mg        100 mg Oral Daily 10/18/20 0425     10/18/20 1000  bictegravir-emtricitabine-tenofovir AF (BIKTARVY) 50-200-25 MG  per tablet 1 tablet  Status:  Discontinued        1 tablet Oral Daily 10/18/20 0610 11/07/20 1451   10/18/20 0415  metroNIDAZOLE (FLAGYL) IVPB 500 mg  Status:  Discontinued        500 mg 100 mL/hr over 60 Minutes Intravenous Every 8 hours 10/18/20 0404 10/19/20 1347   10/18/20 0415  ceFEPIme (MAXIPIME) 2 g in sodium chloride 0.9 % 100 mL IVPB        2 g 200 mL/hr over 30 Minutes Intravenous  Once 10/18/20 0414 10/18/20 0500   10/18/20 0345  vancomycin (VANCOREADY) IVPB 2000 mg/400 mL        2,000 mg 200 mL/hr over 120 Minutes Intravenous  Once 10/18/20 0335 10/18/20 [id number]         Subjective: Patient seen and examined at bedside.  Poor historian.  No fever, worsening shortness of breath, vomiting.  objective: Vitals:   11/20/20 1635 11/20/20 1701 11/20/20 2030  11/21/20 0455  BP: 125/83 117/74 (!) 147/77 (!) 157/94  Pulse: 77 76 74 87  Resp: '18 18 18 16  '$ Temp: 98.1 F (36.7 C) 98.1 F (36.7 C) 98.2 F (36.8 C) 98.7 F (37.1 C)  TempSrc: Oral Oral Oral Oral  SpO2: 100% 97% 99% 97%  Weight:      Height:        Intake/Output Summary (Last 24 hours) at 11/21/2020 0749 Last data filed at 11/21/2020 0510 Gross per 24 hour  Intake 966 ml  Output 350 ml  Net 616 ml    Filed Weights   11/15/20 2116 11/18/20 2119 11/20/20 0618  Weight: 124.7 kg 117.3 kg 117.3 kg    Examination:  General exam: Currently still on room air.  No acute distress.  Looks chronically ill and deconditioned.  Respiratory system: Bilateral decreased breath sounds at bases with scattered crackles  cardiovascular system: S1-S2 heard, rate controlled currently  gastrointestinal system: Abdomen is distended slightly, soft and nontender.  Bowel sounds are heard extremities: Right BKA present, left BKA present with has a wound VAC with bloody drainage  Central nervous system: Still very slow to respond.  Poor historian.  No obvious focal deficits  skin: No obvious ecchymosis/lesions psychiatry: Flat  affect.  Hardly participates in any conversation.  Data Reviewed: I have personally reviewed following labs and imaging studies  CBC: Recent Labs  Lab 11/16/20 0320 11/17/20 0353 11/19/20 0653 11/19/20 2010 11/20/20 0841 11/21/20 0425  WBC 9.1 9.1 11.5*  --  11.2* 9.4  NEUTROABS 6.3 6.2  --   --  8.4* 6.8  HGB 11.6* 7.5* 6.0* 7.1* 6.9* 8.1*  HCT 35.8* 23.1* 18.3* 21.4* 20.7* 23.9*  MCV 89.3 90.2 91.0  --  90.4 90.9  PLT 200 284 249  --  294 0000000    Basic Metabolic Panel: Recent Labs  Lab 11/16/20 0320 11/17/20 0353 11/19/20 0653 11/20/20 0454 11/21/20 0425  NA 136 137 133* 135 134*  K 4.2 4.0 3.9 4.0 4.0  CL 108 107 105 107 103  CO2 21* 23 21* 21* 21*  GLUCOSE 153* 114* 119* 113* 120*  BUN 24* 19 22* 20 18  CREATININE 2.50* 2.15* 2.30* 2.08* 1.87*  CALCIUM 8.1* 8.4* 7.7* 8.1* 8.5*  MG 1.6*  --  1.5* 1.8 1.8  PHOS 3.7 3.6 3.3 3.6 3.3    GFR: Estimated Creatinine Clearance: 56.6 mL/min (A) (by C-G formula based on SCr of 1.87 mg/dL (H)). Liver Function Tests: Recent Labs  Lab 11/16/20 0320 11/17/20 0353 11/19/20 0653 11/20/20 0454 11/21/20 0425  ALBUMIN 2.2* 2.3* 2.0* 2.1* 2.1*    No results for input(s): LIPASE, AMYLASE in the last 168 hours. No results for input(s): AMMONIA in the last 168 hours. Coagulation Profile: No results for input(s): INR, PROTIME in the last 168 hours. Cardiac Enzymes: No results for input(s): CKTOTAL, CKMB, CKMBINDEX, TROPONINI in the last 168 hours. BNP (last 3 results) No results for input(s): PROBNP in the last 8760 hours. HbA1C: No results for input(s): HGBA1C in the last 72 hours. CBG: Recent Labs  Lab 11/20/20 0702 11/20/20 1202 11/20/20 1639 11/20/20 2153 11/21/20 0636  GLUCAP 116* 130* 122* 120* 120*    Lipid Profile: No results for input(s): CHOL, HDL, LDLCALC, TRIG, CHOLHDL, LDLDIRECT in the last 72 hours. Thyroid Function Tests: No results for input(s): TSH, T4TOTAL, FREET4, T3FREE, THYROIDAB in the last  72 hours. Anemia Panel: No results for input(s): VITAMINB12, FOLATE, FERRITIN, TIBC, IRON, RETICCTPCT in the last 72 hours.  Sepsis Labs: No results for input(s): PROCALCITON, LATICACIDVEN in the last 168 hours.  No results found for this or any previous visit (from the past 240 hour(s)).       Radiology Studies: No results found.      Scheduled Meds:  sodium chloride   Intravenous Once   sodium chloride   Intravenous Once   sodium chloride   Intravenous Once   allopurinol  100 mg Oral Daily   vitamin C  1,000 mg Oral Daily   aspirin EC  81 mg Oral Daily   atorvastatin  80 mg Oral Daily   dapsone  100 mg Oral Daily   docusate sodium  100 mg Oral Daily   dolutegravir  50 mg Oral Daily   doravirine  100 mg Oral Daily   escitalopram  20 mg Oral Daily   finasteride  5 mg Oral Daily   gabapentin  100 mg Oral TID   icosapent Ethyl  2 g Oral BID   insulin aspart  0-15 Units Subcutaneous TID WC   insulin aspart  0-5 Units Subcutaneous QHS   lamiVUDine  300 mg Oral Daily   nutrition supplement (JUVEN)  1 packet Oral BID BM   oxybutynin  2.5 mg Oral BID   oxyCODONE  10 mg Oral QID   pantoprazole  40 mg Oral Daily   senna  2 tablet Oral QHS   zinc sulfate  220 mg Oral Daily   Continuous Infusions:  magnesium sulfate bolus IVPB            Aline August, MD [facility] 11/21/2020, 7:49 AM

## 2020-11-22 ENCOUNTER — Other Ambulatory Visit (HOSPITAL_COMMUNITY): Payer: Self-pay

## 2020-11-22 DIAGNOSIS — N179 Acute kidney failure, unspecified: Secondary | ICD-10-CM | POA: Diagnosis not present

## 2020-11-22 LAB — GLUCOSE, CAPILLARY
Glucose-Capillary: 116 mg/dL — ABNORMAL HIGH (ref 70–99)
Glucose-Capillary: 103 mg/dL — ABNORMAL HIGH (ref 70–99)
Glucose-Capillary: 103 mg/dL — ABNORMAL HIGH (ref 70–99)
Glucose-Capillary: 130 mg/dL — ABNORMAL HIGH (ref 70–99)

## 2020-11-22 LAB — RENAL FUNCTION PANEL
Albumin: 2.2 g/dL — ABNORMAL LOW (ref 3.5–5.0)
Anion gap: 8 (ref 5–15)
BUN: 16 mg/dL (ref 6–20)
CO2: 23 mmol/L (ref 22–32)
Calcium: 8.7 mg/dL — ABNORMAL LOW (ref 8.9–10.3)
Chloride: 104 mmol/L (ref 98–111)
Creatinine, Ser: 1.79 mg/dL — ABNORMAL HIGH (ref 0.61–1.24)
GFR, Estimated: 44 mL/min — ABNORMAL LOW (ref >60)
Glucose, Bld: 112 mg/dL — ABNORMAL HIGH (ref 70–99)
Phosphorus: 3.4 mg/dL (ref 2.5–4.6)
Potassium: 4 mmol/L (ref 3.5–5.1)
Sodium: 135 mmol/L (ref 135–145)

## 2020-11-22 LAB — CBC WITH DIFFERENTIAL/PLATELET
Abs Immature Granulocytes: 0.05 10*3/uL (ref 0.00–0.07)
Basophils Absolute: 0.1 10*3/uL (ref 0.0–0.1)
Basophils Relative: 1 %
Eosinophils Absolute: 0.4 10*3/uL (ref 0.0–0.5)
Eosinophils Relative: 4 %
HCT: 26 % — ABNORMAL LOW (ref 39.0–52.0)
Hemoglobin: 8.4 g/dL — ABNORMAL LOW (ref 13.0–17.0)
Immature Granulocytes: 1 %
Lymphocytes Relative: 14 %
Lymphs Abs: 1.5 10*3/uL (ref 0.7–4.0)
MCH: 29.5 pg (ref 26.0–34.0)
MCHC: 32.3 g/dL (ref 30.0–36.0)
MCV: 91.2 fL (ref 80.0–100.0)
Monocytes Absolute: 0.8 10*3/uL (ref 0.1–1.0)
Monocytes Relative: 8 %
Neutro Abs: 7.3 10*3/uL (ref 1.7–7.7)
Neutrophils Relative %: 72 %
Platelets: 423 10*3/uL — ABNORMAL HIGH (ref 150–400)
RBC: 2.85 MIL/uL — ABNORMAL LOW (ref 4.22–5.81)
RDW: 13.8 % (ref 11.5–15.5)
WBC: 10.2 10*3/uL (ref 4.0–10.5)
nRBC: 0 % (ref 0.0–0.2)

## 2020-11-22 LAB — MAGNESIUM: Magnesium: 1.8 mg/dL (ref 1.7–2.4)

## 2020-11-22 NOTE — Plan of Care (Signed)
  Problem: Clinical Measurements: Goal: Will remain free from infection Outcome: Adequate for Discharge   

## 2020-11-22 NOTE — Progress Notes (Signed)
PROGRESS NOTE    Keith [name]  [id number] DOB: 1966/02/01 DOA: 10/17/2020 [name], NP   Brief Narrative: This 55 year old male with past medical history of HIV, chronic kidney disease stage IIIb, type 2 diabetes mellitus, hypertension, hyperlipidemia, coronary artery disease, status post right below knee amputation who was sent to the ED by his orthopedic surgeon due to concern for left heel ulcer that had failed outpatient therapy.  Subsequently, MRI confirmed osteomyelitis.  He underwent left transtibial amputation by [name]. During the hospitalization, he had worsening renal function.  Nephrology was consulted.  He had temporary dialysis catheter placed and dialysis was started.  Later, he developed hematuria and required fulguration and CBI along with blood transfusions.  Renal function improved; dialysis was stopped and nephrology signed off. Subsequently, his wound dehisced with bleeding requiring blood transfusion.  He underwent left below-knee amputation revision by [name] on 11/17/2020.   Assessment & Plan:   Principal Problem:   AKI (acute kidney injury) ([place]) Active Problems:   Elevated blood pressure reading with diagnosis of hypertension   HIV disease ([place])   Uncontrolled type 2 diabetes mellitus with hyperglycemia ([facility])   Cellulitis   Ulcer of heel due to diabetes mellitus ([facility])   Normochromic normocytic anemia   Cellulitis of left foot   Acute osteomyelitis of left calcaneus ([facility])   Dehiscence of amputation stump (HCC)   Hx of BKA, left (H[facility])  Acute kidney injury with metabolic acidosis: Patient presented with worsening renal functions and started on dialysis during this admission but was subsequently stopped and nephrology signed off.   AKI was felt to be due to ATN.  Tunneled dialysis catheter has already been removed. Renal ultrasound was negative for hydronephrosis Status post Foley catheter placement on 11/08/2020 with clots: Urology was  consulted.   He underwent cystoscopy with fulguration on 11/09/2020 and started on CBI and Proscar.  Subsequently CBI was discontinued Creatinine was improving but has worsened over the last 2 days.   Creatinine 2.05 on 11/14/2020 and worsened to 3.15 on 11/15/2020.  Creatinine 1.79 today.  DC'd IV fluids on 11/20/2020. Foley catheter discontinued on 11/20/2020.  Hematuria has improved.   Anemia due to acute blood loss from bladder and left stump Hemoglobin dropped to 6.5 on 11/14/2020 for which patient was transfused 2 more units packed red cells.   Hemoglobin again dropped to 6.0 on 11/19/2020; status post 1 more unit of packed red cell transfusion.   Hemoglobin 6.9 on 11/20/2020.  Status post 1 more unit packed red cell transfusion on 11/20/2020. Hb 8.1> 8.4, continue to monitor H&H  Left heel osteomyelitis and abscess status post transtibial amputation on 10/27/2020 with subsequent dehiscence of wound with bleeding Patient has had significant bleeding from the wound with wound dehiscence requiring blood transfusions. Orthopedics following: Status post revision of left below-knee amputation on 11/17/2020 by [name].  Still has wound VAC. Further management as per ortho   Hyponatremia Replaced and improved.   CAD status post stenting in 03/2020 Continue aspirin,  Brilinta discontinued for now since 11/19/2020 as per cardiology recommendations.   Once bleeding stabilizes, will resume Brilinta.  Continue Vascepa and Lipitor  Hyperkalemia Resolved  HIV Continue current regimen of antiretrovirals and dapsone as per ID.  Outpatient follow-up with ID   Diabetes mellitus type 2 with hyperglycemia Continue CBGs with SSI.  A1c 13.9 on 08/29/2020.  DVT prophylaxis: SCDs Code Status: Full code. Family Communication: None at bed side. Disposition Plan:   Status  is: Inpatient  Remains inpatient appropriate because:Inpatient level of care appropriate due to severity of illness  Dispo:  Patient From:     Planned Disposition: [place]  Medically stable for discharge:       Consultants:  Nephrology, ID, Urology, Ortho  Procedures: Dialysis catheter placement and subsequent removal,  left transtibial amputation Revision of left below-knee amputation on 11/17/2020 by [name].  Antimicrobials:   Anti-infectives (From admission, onward)    Start     Dose/Rate Route Frequency Ordered Stop   11/18/20 0100  ceFAZolin (ANCEF) IVPB 2g/100 mL premix        2 g 200 mL/hr over 30 Minutes Intravenous Every 8 hours 11/17/20 2045 11/18/20 1038   11/17/20 0600  ceFAZolin (ANCEF) IVPB 3g/100 mL premix        3 g 200 mL/hr over 30 Minutes Intravenous On call to O.R. 11/16/20 1941 11/17/20 1901   11/11/20 1200  vancomycin (VANCOREADY) IVPB 500 mg/100 mL  Status:  Discontinued        500 mg 100 mL/hr over 60 Minutes Intravenous Every T-Th-Sa (Hemodialysis) 11/09/20 1414 11/11/20 1328   11/10/20 0000  Dolutegravir-lamiVUDine (DOVATO) 50-300 MG TABS        1 tablet Oral Daily 11/10/20 1048     11/10/20 0000  doravirine (PIFELTRO) 100 MG TABS tablet        100 mg Oral Daily 11/10/20 1048     11/08/20 1000  dolutegravir (TIVICAY) tablet 50 mg        50 mg Oral Daily 11/07/20 1451     11/08/20 1000  lamiVUDine (EPIVIR) tablet 300 mg        300 mg Oral Daily 11/07/20 1451     11/08/20 1000  doravirine (PIFELTRO) tablet 100 mg        100 mg Oral Daily 11/07/20 1451     11/02/20 1130  ceFAZolin (ANCEF) IVPB 2g/100 mL premix        2 g 200 mL/hr over 30 Minutes Intravenous To Radiology 11/02/20 1042 11/02/20 1704   10/27/20 1245  ceFAZolin (ANCEF) IVPB 3g/100 mL premix        3 g 200 mL/hr over 30 Minutes Intravenous On call to O.R. 10/27/20 0755 10/27/20 1608   10/20/20 1415  linezolid (ZYVOX) tablet 600 mg  Status:  Discontinued        600 mg Oral Every 12 hours 10/20/20 1326 10/28/20 1115   10/19/20 1445  metroNIDAZOLE (FLAGYL) tablet 500 mg  Status:  Discontinued        500 mg Oral  Every 12 hours 10/19/20 1347 10/28/20 1115   10/18/20 1600  ceFEPIme (MAXIPIME) 2 g in sodium chloride 0.9 % 100 mL IVPB  Status:  Discontinued        2 g 200 mL/hr over 30 Minutes Intravenous Every 12 hours 10/18/20 0414 10/28/20 1112   10/18/20 1000  dapsone tablet 100 mg        100 mg Oral Daily 10/18/20 0425     10/18/20 1000  bictegravir-emtricitabine-tenofovir AF (BIKTARVY) 50-200-25 MG per tablet 1 tablet  Status:  Discontinued        1 tablet Oral Daily 10/18/20 0610 11/07/20 1451   10/18/20 0415  metroNIDAZOLE (FLAGYL) IVPB 500 mg  Status:  Discontinued        500 mg 100 mL/hr over 60 Minutes Intravenous Every 8 hours 10/18/20 0404 10/19/20 1347   10/18/20 0415  ceFEPIme (MAXIPIME) 2 g in sodium chloride 0.9 % 100  mL IVPB        2 g 200 mL/hr over 30 Minutes Intravenous  Once 10/18/20 0414 10/18/20 0500   10/18/20 0345  vancomycin (VANCOREADY) IVPB 2000 mg/400 mL        2,000 mg 200 mL/hr over 120 Minutes Intravenous  Once 10/18/20 0335 10/18/20 [id number]        Subjective: Patient was seen and examined at bedside.  Overnight events noted.   Patient is lying comfortably on the bed.  Left above-knee amputation.  Objective: Vitals:   11/21/20 2057 11/22/20 0524 11/22/20 0900 11/22/20 1636  BP: (!) 146/93 (!) 156/95  (!) 151/97  Pulse: 69 86 88 78  Resp: '18 18 18 18  '$ Temp: 98 F (36.7 C) 99.3 F (37.4 C) 98.8 F (37.1 C) 98.8 F (37.1 C)  TempSrc:  Oral Oral Oral  SpO2: 98% 99% 98% 100%  Weight:      Height:        Intake/Output Summary (Last 24 hours) at 11/22/2020 1640 Last data filed at 11/22/2020 1300 Gross per 24 hour  Intake 420 ml  Output 1200 ml  Net -780 ml   Filed Weights   11/15/20 2116 11/18/20 2119 11/20/20 0618  Weight: 124.7 kg 117.3 kg 117.3 kg    Examination:  General exam: Appears calm and comfortable, not in any acute distress. Respiratory system: Clear to auscultation, respiratory effort normal, RR 15 Cardiovascular system: S1 & S2 heard,  RRR. No JVD, murmurs, rubs, gallops or clicks. No pedal edema. Gastrointestinal system: Abdomen is nondistended, soft and nontender. No organomegaly or masses felt. Normal bowel sounds heard. Central nervous system: Alert and oriented. No focal neurological deficits. Extremities: Right BKA present, left BKA with wound VAC with bloody drainage Skin: No rashes, lesions or ulcers Psychiatry: Flat affect.  Not fully involved in conversation    Data Reviewed: I have personally reviewed following labs and imaging studies  CBC: Recent Labs  Lab 11/16/20 0320 11/17/20 0353 11/19/20 0653 11/19/20 2010 11/20/20 0841 11/21/20 0425 11/22/20 0511  WBC 9.1 9.1 11.5*  --  11.2* 9.4 10.2  NEUTROABS 6.3 6.2  --   --  8.4* 6.8 7.3  HGB 11.6* 7.5* 6.0* 7.1* 6.9* 8.1* 8.4*  HCT 35.8* 23.1* 18.3* 21.4* 20.7* 23.9* 26.0*  MCV 89.3 90.2 91.0  --  90.4 90.9 91.2  PLT 200 284 249  --  294 349 [id number]*   Basic Metabolic Panel: Recent Labs  Lab 11/16/20 0320 11/17/20 0353 11/19/20 0653 11/20/20 0454 11/21/20 0425 11/22/20 0511  NA 136 137 133* 135 134* 135  K 4.2 4.0 3.9 4.0 4.0 4.0  CL 108 107 105 107 103 104  CO2 21* 23 21* 21* 21* 23  GLUCOSE 153* 114* 119* 113* 120* 112*  BUN 24* 19 22* '20 18 16  '$ CREATININE 2.50* 2.15* 2.30* 2.08* 1.87* 1.79*  CALCIUM 8.1* 8.4* 7.7* 8.1* 8.5* 8.7*  MG 1.6*  --  1.5* 1.8 1.8 1.8  PHOS 3.7 3.6 3.3 3.6 3.3 3.4   GFR: Estimated Creatinine Clearance: 59.1 mL/min (A) (by C-G formula based on SCr of 1.79 mg/dL (H)). Liver Function Tests: Recent Labs  Lab 11/17/20 0353 11/19/20 0653 11/20/20 0454 11/21/20 0425 11/22/20 0511  ALBUMIN 2.3* 2.0* 2.1* 2.1* 2.2*   No results for input(s): LIPASE, AMYLASE in the last 168 hours. No results for input(s): AMMONIA in the last 168 hours. Coagulation Profile: No results for input(s): INR, PROTIME in the last 168 hours. Cardiac Enzymes: No results for input(s):  CKTOTAL, CKMB, CKMBINDEX, TROPONINI in the last 168  hours. BNP (last 3 results) No results for input(s): PROBNP in the last 8760 hours. HbA1C: No results for input(s): HGBA1C in the last 72 hours. CBG: Recent Labs  Lab 11/21/20 1139 11/21/20 1651 11/21/20 2056 11/22/20 0751 11/22/20 1107  GLUCAP 147* 135* 122* 116* 103*   Lipid Profile: No results for input(s): CHOL, HDL, LDLCALC, TRIG, CHOLHDL, LDLDIRECT in the last 72 hours. Thyroid Function Tests: No results for input(s): TSH, T4TOTAL, FREET4, T3FREE, THYROIDAB in the last 72 hours. Anemia Panel: No results for input(s): VITAMINB12, FOLATE, FERRITIN, TIBC, IRON, RETICCTPCT in the last 72 hours. Sepsis Labs: No results for input(s): PROCALCITON, LATICACIDVEN in the last 168 hours.  No results found for this or any previous visit (from the past 240 hour(s)).   Radiology Studies: No results found.  Scheduled Meds:  sodium chloride   Intravenous Once   sodium chloride   Intravenous Once   sodium chloride   Intravenous Once   allopurinol  100 mg Oral Daily   vitamin C  1,000 mg Oral Daily   aspirin EC  81 mg Oral Daily   atorvastatin  80 mg Oral Daily   dapsone  100 mg Oral Daily   docusate sodium  100 mg Oral Daily   dolutegravir  50 mg Oral Daily   doravirine  100 mg Oral Daily   empagliflozin  10 mg Oral Daily   escitalopram  20 mg Oral Daily   finasteride  5 mg Oral Daily   gabapentin  100 mg Oral TID   icosapent Ethyl  2 g Oral BID   insulin aspart  0-5 Units Subcutaneous QHS   lamiVUDine  300 mg Oral Daily   nutrition supplement (JUVEN)  1 packet Oral BID BM   oxybutynin  2.5 mg Oral BID   oxyCODONE  10 mg Oral QID   pantoprazole  40 mg Oral Daily   senna  2 tablet Oral QHS   zinc sulfate  220 mg Oral Daily   Continuous Infusions:  magnesium sulfate bolus IVPB       LOS: 35 days    Time spent: 35 mins    Keith Dollard, MD [facility]   If 7PM-7AM, please contact night-coverage

## 2020-11-22 NOTE — TOC Benefit Eligibility Note (Signed)
Patient Teacher, English as a foreign language completed.    The patient is currently admitted and upon discharge could be taking Jardiance 10 mg.  The current 30 day co-pay is, $0.00.   The patient is insured through [employer]     Keith [name], [place] Patient Advocate Specialist [place] Team Direct Number: [phone]  Fax: [phone]

## 2020-11-22 NOTE — Progress Notes (Signed)
Patient is 5 days status post below-knee amputation revision.  He is comfortable lying in bed.  Wound VAC canister has had no further drainage per the mark on the canister.  Is functioning with 1 green check.  Anticipate patient will discharge to skilled nursing facility when bed available

## 2020-11-22 NOTE — TOC Progression Note (Addendum)
Transition of Care [facility]) - Progression Note    Patient Details  Name: Keith [name] MRN: [name] Date of Birth: 12/19/1965  Transition of Care [facility]) CM/SW Contact  Sharlet Salina Mila Homer, LCSW Phone Number: 11/22/2020, 11:02 AM  Clinical Narrative:  Received an e-mail response from Toniann Ket, financial counselor that patient has [employer]: ID# [id number]. Resent initial referral to all SNF's and included a note regarding patient's insurance.  1:42 pm: Checked bed offers and in addition to [facility], [place] made a bed offer. 1:44 pm: Call made to Neoma Laming, admissions Mudlogger at [facility] regarding patient having [employer] and Neoma Laming reported that they do accept [employer]. She will review patient's information and get back with CSW.    CSW talked later with Neoma Laming at [facility] and they can't accept patient. Facility search will continue for patient.       Expected Discharge Plan: Skilled Nursing Facility Barriers to Discharge: SNF Pending bed offer  Expected Discharge Plan and Services Expected Discharge Plan: [place] In-house Referral: Clinical Social Work Discharge Planning Services: CM Consult Post Acute Care Choice: [place] arrangements for the past 2 months: Single Family Home                                       Social Determinants of Health (SDOH) Interventions    Readmission Risk Interventions Readmission Risk Prevention Plan 10/21/2020 11/13/2018 11/13/2018  Transportation Screening Complete - Complete  PCP or Specialist Appt within 3-5 Days - - -  [place] or [place] Work Consult for [place] - - -  Medication Review Press photographer) Complete - Complete  PCP or Specialist appointment within 3-5 days of discharge Complete Complete -  [place] or Home Care Consult Complete Complete -  SW  Recovery Care/Counseling Consult Complete Complete -  Palliative Care Screening Not Applicable Not Applicable -  [place] Complete Not Applicable -  Some encounter information is confidential and restricted. Go to Review Flowsheets activity to see all data.  Some recent data might be hidden

## 2020-11-23 DIAGNOSIS — N179 Acute kidney failure, unspecified: Secondary | ICD-10-CM | POA: Diagnosis not present

## 2020-11-23 LAB — BASIC METABOLIC PANEL
Anion gap: 7 (ref 5–15)
BUN: 18 mg/dL (ref 6–20)
CO2: 23 mmol/L (ref 22–32)
Calcium: 8.7 mg/dL — ABNORMAL LOW (ref 8.9–10.3)
Chloride: 103 mmol/L (ref 98–111)
Creatinine, Ser: 1.75 mg/dL — ABNORMAL HIGH (ref 0.61–1.24)
GFR, Estimated: 45 mL/min — ABNORMAL LOW (ref >60)
Glucose, Bld: 121 mg/dL — ABNORMAL HIGH (ref 70–99)
Potassium: 4.2 mmol/L (ref 3.5–5.1)
Sodium: 133 mmol/L — ABNORMAL LOW (ref 135–145)

## 2020-11-23 LAB — CBC WITH DIFFERENTIAL/PLATELET
Abs Immature Granulocytes: 0.05 10*3/uL (ref 0.00–0.07)
Basophils Absolute: 0 10*3/uL (ref 0.0–0.1)
Basophils Relative: 0 %
Eosinophils Absolute: 0.4 10*3/uL (ref 0.0–0.5)
Eosinophils Relative: 4 %
HCT: 23.9 % — ABNORMAL LOW (ref 39.0–52.0)
Hemoglobin: 7.9 g/dL — ABNORMAL LOW (ref 13.0–17.0)
Immature Granulocytes: 1 %
Lymphocytes Relative: 13 %
Lymphs Abs: 1.3 10*3/uL (ref 0.7–4.0)
MCH: 29.9 pg (ref 26.0–34.0)
MCHC: 33.1 g/dL (ref 30.0–36.0)
MCV: 90.5 fL (ref 80.0–100.0)
Monocytes Absolute: 0.7 10*3/uL (ref 0.1–1.0)
Monocytes Relative: 7 %
Neutro Abs: 7.3 10*3/uL (ref 1.7–7.7)
Neutrophils Relative %: 75 %
Platelets: 460 10*3/uL — ABNORMAL HIGH (ref 150–400)
RBC: 2.64 MIL/uL — ABNORMAL LOW (ref 4.22–5.81)
RDW: 14.1 % (ref 11.5–15.5)
WBC: 9.7 10*3/uL (ref 4.0–10.5)
nRBC: 0 % (ref 0.0–0.2)

## 2020-11-23 LAB — RENAL FUNCTION PANEL
Albumin: 2.2 g/dL — ABNORMAL LOW (ref 3.5–5.0)
Anion gap: 12 (ref 5–15)
BUN: 17 mg/dL (ref 6–20)
CO2: 23 mmol/L (ref 22–32)
Calcium: 9.2 mg/dL (ref 8.9–10.3)
Chloride: 100 mmol/L (ref 98–111)
Creatinine, Ser: 1.86 mg/dL — ABNORMAL HIGH (ref 0.61–1.24)
GFR, Estimated: 42 mL/min — ABNORMAL LOW (ref >60)
Glucose, Bld: 114 mg/dL — ABNORMAL HIGH (ref 70–99)
Phosphorus: 3.5 mg/dL (ref 2.5–4.6)
Potassium: 4.3 mmol/L (ref 3.5–5.1)
Sodium: 135 mmol/L (ref 135–145)

## 2020-11-23 LAB — MAGNESIUM: Magnesium: 1.7 mg/dL (ref 1.7–2.4)

## 2020-11-23 LAB — GLUCOSE, CAPILLARY
Glucose-Capillary: 127 mg/dL — ABNORMAL HIGH (ref 70–99)
Glucose-Capillary: 131 mg/dL — ABNORMAL HIGH (ref 70–99)
Glucose-Capillary: 129 mg/dL — ABNORMAL HIGH (ref 70–99)

## 2020-11-23 MED ORDER — HEPARIN SODIUM (PORCINE) 5000 UNIT/ML IJ SOLN
5000.0000 [IU] | Freq: Three times a day (TID) | INTRAMUSCULAR | Status: DC
  Administered 2020-11-23 (×2): 5000 [IU] via SUBCUTANEOUS
  Filled 2020-11-23 (×2): qty 1

## 2020-11-23 NOTE — Plan of Care (Signed)
?  Problem: Clinical Measurements: ?Goal: Ability to avoid or minimize complications of infection will improve ?Outcome: Progressing ?  ?Problem: Skin Integrity: ?Goal: Skin integrity will improve ?Outcome: Progressing ?  ?

## 2020-11-23 NOTE — Progress Notes (Addendum)
PROGRESS NOTE    Keith [name]  [id number] DOB: 1965/04/25 DOA: 10/17/2020 [name], NP   Brief Narrative: This 55 year old male with past medical history of HIV, chronic kidney disease stage IIIb, type 2 diabetes mellitus, hypertension, hyperlipidemia, coronary artery disease, status post right below knee amputation who was sent to the ED by his orthopedic surgeon due to concern for left heel ulcer that had failed outpatient therapy.  Subsequently, MRI confirmed osteomyelitis.  He underwent left transtibial amputation by [name]. During the hospitalization, he had worsening renal function.  Nephrology was consulted.  He had temporary dialysis catheter placed and dialysis was started.  Later, he developed hematuria and required fulguration and CBI along with blood transfusions.  Renal function improved; dialysis was stopped and nephrology signed off. Subsequently, his wound dehisced with bleeding requiring blood transfusion.  He underwent left below-knee amputation revision by [name] on 11/17/2020.   Assessment & Plan:   Principal Problem:   AKI (acute kidney injury) ([place]) Active Problems:   Elevated blood pressure reading with diagnosis of hypertension   HIV disease (Keith [name])   Uncontrolled type 2 diabetes mellitus with hyperglycemia (HCC)   Cellulitis   Ulcer of heel due to diabetes mellitus (HCC)   Normochromic normocytic anemia   Cellulitis of left foot   Acute osteomyelitis of left calcaneus (HCC)   Dehiscence of amputation stump (HCC)   Hx of BKA, left (HCC)  Acute kidney injury with metabolic acidosis: > Improved. Patient presented with worsening renal functions and started on dialysis during this admission but was subsequently stopped and nephrology signed off.   AKI was felt to be due to ATN.  Tunneled dialysis catheter has already been removed. Renal ultrasound was negative for hydronephrosis. Status post Foley catheter placement on 11/08/2020 with clots: Urology  was consulted.   He underwent cystoscopy with fulguration on 11/09/2020 and started on CBI and Proscar.  Subsequently CBI was discontinued Creatinine 2.05 on 11/14/2020 and worsened to 3.15 on 11/15/2020.  Creatinine 1.75 today.  DC'd IV fluids on 11/20/2020. Foley catheter discontinued on 11/20/2020.  Hematuria has improved.   Anemia due to acute blood loss from bladder and left stump Hemoglobin dropped to 6.5 on 11/14/2020 for which patient was transfused 2 more units packed red cells.   Hemoglobin again dropped to 6.0 on 11/19/2020; status post 1 more unit of packed red cell transfusion.   Hemoglobin 6.9 on 11/20/2020.  Status post 1 more unit packed red cell transfusion on 11/20/2020. Hb 8.1> 8.4 > 7.9 , continue to monitor H&H  Left heel osteomyelitis and abscess status post transtibial amputation on 10/27/2020 with subsequent dehiscence of wound with bleeding. Patient has had significant bleeding from the wound with wound dehiscence requiring blood transfusions. Orthopedics following: Status post revision of left below-knee amputation on 11/17/2020 by [name].   Wound VAC was removed today. Further management as per ortho   Hyponatremia Replaced and improved.   CAD status post stenting in 03/2020 Continue aspirin,  Brilinta discontinued for now since 11/19/2020 as per cardiology recommendations.   Once bleeding stabilizes, will resume Brilinta.  Continue Vascepa and Lipitor  Hyperkalemia Resolved.  HIV Continue current regimen of antiretrovirals and dapsone as per ID.  Outpatient follow-up with ID   Diabetes mellitus type 2 with hyperglycemia Continue CBGs with SSI.  A1c 13.9 on 08/29/2020.  DVT prophylaxis: SCDs Code Status: Full code. Family Communication: None at bed side. Disposition Plan:   Status is: Inpatient  Remains inpatient appropriate  because:Inpatient level of care appropriate due to severity of illness  Dispo:  Patient From:    Planned Disposition: [place]  Medically stable for discharge:       Consultants:  Nephrology, ID, Urology, Ortho  Procedures: Dialysis catheter placement and subsequent removal,  left transtibial amputation Revision of left below-knee amputation on 11/17/2020 by [name].  Antimicrobials:   Anti-infectives (From admission, onward)    Start     Dose/Rate Route Frequency Ordered Stop   11/18/20 0100  ceFAZolin (ANCEF) IVPB 2g/100 mL premix        2 g 200 mL/hr over 30 Minutes Intravenous Every 8 hours 11/17/20 2045 11/18/20 1038   11/17/20 0600  ceFAZolin (ANCEF) IVPB 3g/100 mL premix        3 g 200 mL/hr over 30 Minutes Intravenous On call to O.R. 11/16/20 1941 11/17/20 1901   11/11/20 1200  vancomycin (VANCOREADY) IVPB 500 mg/100 mL  Status:  Discontinued        500 mg 100 mL/hr over 60 Minutes Intravenous Every T-Th-Sa (Hemodialysis) 11/09/20 1414 11/11/20 1328   11/10/20 0000  Dolutegravir-lamiVUDine (DOVATO) 50-300 MG TABS        1 tablet Oral Daily 11/10/20 1048     11/10/20 0000  doravirine (PIFELTRO) 100 MG TABS tablet        100 mg Oral Daily 11/10/20 1048     11/08/20 1000  dolutegravir (TIVICAY) tablet 50 mg        50 mg Oral Daily 11/07/20 1451     11/08/20 1000  lamiVUDine (EPIVIR) tablet 300 mg        300 mg Oral Daily 11/07/20 1451     11/08/20 1000  doravirine (PIFELTRO) tablet 100 mg        100 mg Oral Daily 11/07/20 1451     11/02/20 1130  ceFAZolin (ANCEF) IVPB 2g/100 mL premix        2 g 200 mL/hr over 30 Minutes Intravenous To Radiology 11/02/20 1042 11/02/20 1704   10/27/20 1245  ceFAZolin (ANCEF) IVPB 3g/100 mL premix        3 g 200 mL/hr over 30 Minutes Intravenous On call to O.R. 10/27/20 0755 10/27/20 1608   10/20/20 1415  linezolid (ZYVOX) tablet 600 mg  Status:  Discontinued        600 mg Oral Every 12 hours 10/20/20 1326 10/28/20 1115   10/19/20 1445  metroNIDAZOLE (FLAGYL) tablet 500 mg  Status:  Discontinued        500 mg Oral Every 12 hours 10/19/20 1347 10/28/20  1115   10/18/20 1600  ceFEPIme (MAXIPIME) 2 g in sodium chloride 0.9 % 100 mL IVPB  Status:  Discontinued        2 g 200 mL/hr over 30 Minutes Intravenous Every 12 hours 10/18/20 0414 10/28/20 1112   10/18/20 1000  dapsone tablet 100 mg        100 mg Oral Daily 10/18/20 0425     10/18/20 1000  bictegravir-emtricitabine-tenofovir AF (BIKTARVY) 50-200-25 MG per tablet 1 tablet  Status:  Discontinued        1 tablet Oral Daily 10/18/20 0610 11/07/20 1451   10/18/20 0415  metroNIDAZOLE (FLAGYL) IVPB 500 mg  Status:  Discontinued        500 mg 100 mL/hr over 60 Minutes Intravenous Every 8 hours 10/18/20 0404 10/19/20 1347   10/18/20 0415  ceFEPIme (MAXIPIME) 2 g in sodium chloride 0.9 % 100 mL IVPB  2 g 200 mL/hr over 30 Minutes Intravenous  Once 10/18/20 0414 10/18/20 0500   10/18/20 0345  vancomycin (VANCOREADY) IVPB 2000 mg/400 mL        2,000 mg 200 mL/hr over 120 Minutes Intravenous  Once 10/18/20 0335 10/18/20 [id number]        Subjective: Patient was seen and examined at bedside.  Overnight events noted. Patient is lying comfortably in the bed,  recently underwent left below knee amputation. Wound VAC was removed.  Objective: Vitals:   11/22/20 1636 11/22/20 2124 11/23/20 0618 11/23/20 0904  BP: (!) 151/97 (!) 162/97 (!) 144/89 (!) 144/97  Pulse: 78 78  78  Resp: 18   18  Temp: 98.8 F (37.1 C) 98.1 F (36.7 C) 98.5 F (36.9 C) 98.1 F (36.7 C)  TempSrc: Oral Oral Oral Oral  SpO2: 100% 100%  100%  Weight:   121.4 kg   Height:        Intake/Output Summary (Last 24 hours) at 11/23/2020 1517 Last data filed at 11/23/2020 0925 Gross per 24 hour  Intake 840 ml  Output 2700 ml  Net -1860 ml   Filed Weights   11/18/20 2119 11/20/20 0618 11/23/20 0618  Weight: 117.3 kg 117.3 kg 121.4 kg    Examination:  General exam: Appears comfortable, not in any acute distress. Respiratory system: Clear to auscultation bilaterally, respiratory rate 15. Cardiovascular system:  S1-S2 heard, regular rate and rhythm, no murmur. Gastrointestinal system: Abdomen is soft, nontender, nondistended.  Normal bowel sounds heard. Central nervous system: Alert and oriented x2. No focal neurological deficits. Extremities: Right BKA , left BKA,  Skin: No rashes, lesions or ulcers Psychiatry: Mood and affect appropriate.    Data Reviewed: I have personally reviewed following labs and imaging studies  CBC: Recent Labs  Lab 11/17/20 0353 11/19/20 0653 11/19/20 2010 11/20/20 0841 11/21/20 0425 11/22/20 0511 11/23/20 0239  WBC 9.1 11.5*  --  11.2* 9.4 10.2 9.7  NEUTROABS 6.2  --   --  8.4* 6.8 7.3 7.3  HGB 7.5* 6.0* 7.1* 6.9* 8.1* 8.4* 7.9*  HCT 23.1* 18.3* 21.4* 20.7* 23.9* 26.0* 23.9*  MCV 90.2 91.0  --  90.4 90.9 91.2 90.5  PLT 284 249  --  294 349 423* [id number]*   Basic Metabolic Panel: Recent Labs  Lab 11/19/20 0653 11/20/20 0454 11/21/20 0425 11/22/20 0511 11/23/20 0239  NA 133* 135 134* 135 135  133*  K 3.9 4.0 4.0 4.0 4.3  4.2  CL 105 107 103 104 100  103  CO2 21* 21* 21* '23 23  23  '$ GLUCOSE 119* 113* 120* 112* 114*  121*  BUN 22* '20 18 16 17  18  '$ CREATININE 2.30* 2.08* 1.87* 1.79* 1.86*  1.75*  CALCIUM 7.7* 8.1* 8.5* 8.7* 9.2  8.7*  MG 1.5* 1.8 1.8 1.8 1.7  PHOS 3.3 3.6 3.3 3.4 3.5   GFR: Estimated Creatinine Clearance: 58 mL/min (A) (by C-G formula based on SCr of 1.86 mg/dL (H)). Liver Function Tests: Recent Labs  Lab 11/19/20 0653 11/20/20 0454 11/21/20 0425 11/22/20 0511 11/23/20 0239  ALBUMIN 2.0* 2.1* 2.1* 2.2* 2.2*   No results for input(s): LIPASE, AMYLASE in the last 168 hours. No results for input(s): AMMONIA in the last 168 hours. Coagulation Profile: No results for input(s): INR, PROTIME in the last 168 hours. Cardiac Enzymes: No results for input(s): CKTOTAL, CKMB, CKMBINDEX, TROPONINI in the last 168 hours. BNP (last 3 results) No results for input(s): PROBNP in the last 8760 hours.  HbA1C: No results for input(s):  HGBA1C in the last 72 hours. CBG: Recent Labs  Lab 11/22/20 0751 11/22/20 1107 11/22/20 1634 11/22/20 2112 11/23/20 0733  GLUCAP 116* 103* 103* 130* 127*   Lipid Profile: No results for input(s): CHOL, HDL, LDLCALC, TRIG, CHOLHDL, LDLDIRECT in the last 72 hours. Thyroid Function Tests: No results for input(s): TSH, T4TOTAL, FREET4, T3FREE, THYROIDAB in the last 72 hours. Anemia Panel: No results for input(s): VITAMINB12, FOLATE, FERRITIN, TIBC, IRON, RETICCTPCT in the last 72 hours. Sepsis Labs: No results for input(s): PROCALCITON, LATICACIDVEN in the last 168 hours.  No results found for this or any previous visit (from the past 240 hour(s)).   Radiology Studies: No results found.  Scheduled Meds:  sodium chloride   Intravenous Once   sodium chloride   Intravenous Once   sodium chloride   Intravenous Once   allopurinol  100 mg Oral Daily   vitamin C  1,000 mg Oral Daily   aspirin EC  81 mg Oral Daily   atorvastatin  80 mg Oral Daily   dapsone  100 mg Oral Daily   docusate sodium  100 mg Oral Daily   dolutegravir  50 mg Oral Daily   doravirine  100 mg Oral Daily   empagliflozin  10 mg Oral Daily   escitalopram  20 mg Oral Daily   finasteride  5 mg Oral Daily   gabapentin  100 mg Oral TID   heparin injection (subcutaneous)  5,000 Units Subcutaneous Q8H   icosapent Ethyl  2 g Oral BID   insulin aspart  0-5 Units Subcutaneous QHS   lamiVUDine  300 mg Oral Daily   nutrition supplement (JUVEN)  1 packet Oral BID BM   oxybutynin  2.5 mg Oral BID   oxyCODONE  10 mg Oral QID   pantoprazole  40 mg Oral Daily   senna  2 tablet Oral QHS   zinc sulfate  220 mg Oral Daily   Continuous Infusions:  magnesium sulfate bolus IVPB       LOS: 36 days    Time spent: 25 mins    Lilliana Turner, MD [facility]   If 7PM-7AM, please contact night-coverage

## 2020-11-23 NOTE — Progress Notes (Signed)
Physical Therapy Treatment Patient Details Name: Keith [name] MRN: [name] DOB: [birth year] Today's Date: 11/23/2020    History of Present Illness Keith [name] is a 55 y.o. male  who is sent to the ED by his orthopedic surgery provider due to concern for left heel ulcer that had failed outpatient therapy. with medical history significant for HIV, CKD 3B, type 2 diabetes, hypertension, hyperlipidemia, coronary artery disease, status post right below the knee amputation, now s/p L BKA 10/27/20. s/p REVISION L AMPUTATION BELOW KNEE on 8/12.    PT Comments    Pt sitting up in chair on entry, looking more alert than in prior visits, agreeable to therapy. Pt requires increased encouragement to attempt standing, however once convinced pt making good progress towards his goals. Pt able to set up for R prosthetic use however requires assist to seat prosthetic prior to mobility. Attempted to come to standing with RW and able to clear hips but not reach up to walker. Pt with more success with Stedy, with maxAx2 ultimately not able to come to fully standing but very close. Pt goals reviewed and adjusted to current level PLOF. D/c plans remain appropriate. PT will continue to follow acutely.    Follow Up Recommendations  SNF     Equipment Recommendations  Wheelchair (measurements PT);Wheelchair cushion (measurements PT)       Precautions / Restrictions Precautions Precautions: Fall Precaution Comments: B BKA, prosthesis for Right Required Braces or Orthoses: Other Brace Restrictions Weight Bearing Restrictions: Yes LLE Weight Bearing: Non weight bearing    Mobility  Bed Mobility               General bed mobility comments: up in recliner    Transfers Overall transfer level: Needs assistance Equipment used: Rolling walker (2 wheeled) Transfers: Sit to/from Stand Sit to Stand: Max assist;+2 physical assistance         General transfer comment: Max A +2, verbal and tactile  cues. To/from recliner. Gait belt and pad support at ischium utilized. Utilized rw for first couple of trials, then switched to [place]. Able to come to almost full stand using Stedy. Pt with difficulty shift weight to RLE with prosthetic on; pt r/o worrying RLE will "give out"        Balance Overall balance assessment: Needs assistance Sitting-balance support: Bilateral upper extremity supported;Feet unsupported Sitting balance-Leahy Scale: Good     Standing balance support: Bilateral upper extremity supported;During functional activity Standing balance-Leahy Scale: Zero Standing balance comment: mod to max A +2, almost full stand position but difficulty shifting weight onto RLW. Tends to stand in left lateral lean, trunk flexed. Improved performance with Stedy bar support utilized.                            Cognition Arousal/Alertness: Awake/alert Behavior During Therapy: Flat affect;WFL for tasks assessed/performed Overall Cognitive Status: No family/caregiver present to determine baseline cognitive functioning Area of Impairment: Problem solving;Safety/judgement;Following commands;Attention                   Current Attention Level: Selective   Following Commands: Follows multi-step commands with increased time;Follows one step commands consistently Safety/Judgement: Decreased awareness of safety   Problem Solving: Slow processing;Difficulty sequencing;Decreased initiation;Requires verbal cues;Requires tactile cues General Comments: Flat affect. Appears internally distracted. Not as talkative today as previous sessions.         General Comments General comments (skin integrity, edema, etc.): VSS on RA,  wound vac removed, L shrinker sock in place      Pertinent Vitals/Pain Pain Assessment: Faces Faces Pain Scale: Hurts a little bit Pain Location: L residual limb Pain Descriptors / Indicators: Discomfort Pain Intervention(s): Limited activity within  patient's tolerance;Monitored during session     PT Goals (current goals can now be found in the care plan section) Acute Rehab PT Goals Patient Stated Goal: to get better PT Goal Formulation: With patient Time For Goal Achievement: 12/07/20 Potential to Achieve Goals: Fair Progress towards PT goals: Progressing toward goals    Frequency    Min 3X/week      PT Plan Current plan remains appropriate    Co-evaluation PT/OT/SLP Co-Evaluation/Treatment: Yes Reason for Co-Treatment: To address functional/ADL transfers;Necessary to address cognition/behavior during functional activity PT goals addressed during session: Mobility/safety with mobility OT goals addressed during session: Strengthening/ROM      AM-PAC PT "6 Clicks" Mobility   Outcome Measure  Help needed turning from your back to your side while in a flat bed without using bedrails?: None Help needed moving from lying on your back to sitting on the side of a flat bed without using bedrails?: A Little Help needed moving to and from a bed to a chair (including a wheelchair)?: A Little Help needed standing up from a chair using your arms (e.g., wheelchair or bedside chair)?: Total Help needed to walk in hospital room?: Total Help needed climbing 3-5 steps with a railing? : Total 6 Click Score: 13    End of Session Equipment Utilized During Treatment: Gait belt Activity Tolerance: Patient tolerated treatment well Patient left: with call bell/phone within reach;in chair;with chair alarm set Nurse Communication: Mobility status PT Visit Diagnosis: Unsteadiness on feet (R26.81);Other abnormalities of gait and mobility (R26.89);Pain;Muscle weakness (generalized) (M62.81) Pain - Right/Left: Left Pain - part of body: Leg     Time: 1058-1130 PT Time Calculation (min) (ACUTE ONLY): 32 min  Charges:  $Therapeutic Activity: 8-22 mins                     Jackolyn Geron B. Migdalia Dk PT, DPT Acute Rehabilitation  Services Pager [phone] Office [phone]    [place] 11/23/2020, 12:20 PM

## 2020-11-23 NOTE — Progress Notes (Signed)
Patient is status post left below-knee amputation revision he is doing well he has had no further drainage from the wound VAC.  Wound VAC was removed well apposed wound edges no evidence of any purulent process no ascending cellulitis no wound dehiscence at this point.  Have ordered daily dressing changes with need follow-up with orthopedics 1 week after discharge

## 2020-11-23 NOTE — Progress Notes (Signed)
Occupational Therapy Treatment Patient Details Name: Keith [name] MRN: [name] DOB: [birth year] Today's Date: 11/23/2020    History of present illness Keith [name] is a 55 y.o. male  who is sent to the ED by his orthopedic surgery provider due to concern for left heel ulcer that had failed outpatient therapy. with medical history significant for HIV, CKD 3B, type 2 diabetes, hypertension, hyperlipidemia, coronary artery disease, status post right below the knee amputation, now s/p L BKA 10/27/20. s/p REVISION L AMPUTATION BELOW KNEE on 8/12.   OT comments  Pt progressing towards acute OT goals. Focus of today's session was working on sit<>stand transfers towards the goals of LB ADLs in sit<>stand position.Today pt needed mod - max A +2 physical assist to practice standing from recliner seat height. Initially practiced with rw then switch to [facility] to give pt a frame to pull up on. Pt able to come to more upright position utilizing Stedy. Pt struggled with weight-shifting onto RLE; reports worrying that RLE will "give out." D/c plan remains appropriate.    Follow Up Recommendations  SNF    Equipment Recommendations  None recommended by OT    Recommendations for Other Services      Precautions / Restrictions Precautions Precautions: Fall Precaution Comments: B BKA, prosthesis for Right Required Braces or Orthoses: Other Brace Restrictions Weight Bearing Restrictions: Yes       Mobility Bed Mobility               General bed mobility comments: up in recliner    Transfers Overall transfer level: Needs assistance Equipment used: Rolling walker (2 wheeled) Transfers: Sit to/from Stand Sit to Stand: Max assist;+2 physical assistance         General transfer comment: Max A +2, verbal and tactile cues. To/from recliner. Gait belt and pad support at ischium utilized. Utilized rw for first couple of trials, then switched to [place]. Able to come to almost full stand using  Stedy. Pt with difficulty shift weight to RLE with prosthetic on; pt r/o worrying RLE will "give out"    Balance Overall balance assessment: Needs assistance Sitting-balance support: Bilateral upper extremity supported;Feet unsupported Sitting balance-Leahy Scale: Good     Standing balance support: Bilateral upper extremity supported;During functional activity Standing balance-Leahy Scale: Zero Standing balance comment: mod to max A +2, almost full stand position but difficulty shifting weight onto RLW. Tends to stand in left lateral lean, trunk flexed. Improved performance with Stedy bar support utilized.                           ADL either performed or assessed with clinical judgement   ADL Overall ADL's : Needs assistance/impaired     Grooming: Applying deodorant;Set up;Sitting                                 General ADL Comments: Pt practiced sit<>stands this session towards functional LB ADL goals and functional transfer goals. Max A +2, verbal and tactile cues. Able to come to almost full stand using Stedy. Pt with difficulty shift weight to RLE with prosthetic on; pt r/o worrying RLE will "give out"     Vision       Perception     Praxis      Cognition Arousal/Alertness: Awake/alert Behavior During Therapy: Flat affect;WFL for tasks assessed/performed Overall Cognitive Status: No family/caregiver present to determine  baseline cognitive functioning Area of Impairment: Problem solving;Safety/judgement;Following commands;Attention                   Current Attention Level: Selective   Following Commands: Follows multi-step commands with increased time;Follows one step commands consistently Safety/Judgement: Decreased awareness of safety   Problem Solving: Slow processing;Difficulty sequencing;Decreased initiation;Requires verbal cues;Requires tactile cues General Comments: Flat affect. Appears internally distracted. Not as talkative  today as previous sessions.        Exercises     Shoulder Instructions       General Comments      Pertinent Vitals/ Pain       Pain Assessment: Faces Faces Pain Scale: Hurts a little bit Pain Location: L residual limb Pain Descriptors / Indicators: Discomfort Pain Intervention(s): Monitored during session  Home Living                                          Prior Functioning/Environment              Frequency  Min 2X/week        Progress Toward Goals  OT Goals(current goals can now be found in the care plan section)  Progress towards OT goals: Progressing toward goals  Acute Rehab OT Goals Patient Stated Goal: to get better OT Goal Formulation: With patient Time For Goal Achievement: 12/07/20 Potential to Achieve Goals: Good ADL Goals Pt Will Perform Lower Body Bathing: with mod assist;sit to/from stand Pt Will Perform Upper Body Dressing: with set-up;sitting Pt Will Perform Lower Body Dressing: sit to/from stand;with mod assist Pt Will Transfer to Toilet: with min assist;stand pivot transfer Pt Will Perform Toileting - Clothing Manipulation and hygiene: with mod assist;sit to/from stand  Plan Discharge plan remains appropriate    Co-evaluation    PT/OT/SLP Co-Evaluation/Treatment: Yes Reason for Co-Treatment: To address functional/ADL transfers;For patient/therapist safety   OT goals addressed during session: Strengthening/ROM;Proper use of Adaptive equipment and DME;ADL's and self-care      AM-PAC OT "6 Clicks" Daily Activity     Outcome Measure   Help from another person eating meals?: None Help from another person taking care of personal grooming?: A Little Help from another person toileting, which includes using toliet, bedpan, or urinal?: A Lot Help from another person bathing (including washing, rinsing, drying)?: A Lot Help from another person to put on and taking off regular upper body clothing?: A Little Help from  another person to put on and taking off regular lower body clothing?: A Lot 6 Click Score: 16    End of Session Equipment Utilized During Treatment: Rolling walker;Other (comment) (RLE prosthtic)  OT Visit Diagnosis: Other abnormalities of gait and mobility (R26.89);Muscle weakness (generalized) (M62.81);Pain Pain - Right/Left: Left Pain - part of body: Leg   Activity Tolerance Patient tolerated treatment well   Patient Left in chair;with call bell/phone within reach;with chair alarm set   Nurse Communication          Time: [id number] OT Time Calculation (min): 32 min  Charges: OT General Charges $OT Visit: 1 Visit OT Treatments $Self Care/Home Management : 8-22 mins  Tyrone Schimke, OT Acute Rehabilitation Services Pager: [phone] Office: [phone]    Hortencia Pilar 11/23/2020, 12:08 PM

## 2020-11-24 ENCOUNTER — Other Ambulatory Visit (HOSPITAL_COMMUNITY): Payer: Self-pay

## 2020-11-24 DIAGNOSIS — N179 Acute kidney failure, unspecified: Secondary | ICD-10-CM | POA: Diagnosis not present

## 2020-11-24 LAB — CBC WITH DIFFERENTIAL/PLATELET
Abs Immature Granulocytes: 0.04 10*3/uL (ref 0.00–0.07)
Basophils Absolute: 0.1 10*3/uL (ref 0.0–0.1)
Basophils Relative: 1 %
Eosinophils Absolute: 0.4 10*3/uL (ref 0.0–0.5)
Eosinophils Relative: 4 %
HCT: 25.5 % — ABNORMAL LOW (ref 39.0–52.0)
Hemoglobin: 8.4 g/dL — ABNORMAL LOW (ref 13.0–17.0)
Immature Granulocytes: 1 %
Lymphocytes Relative: 17 %
Lymphs Abs: 1.5 10*3/uL (ref 0.7–4.0)
MCH: 29.8 pg (ref 26.0–34.0)
MCHC: 32.9 g/dL (ref 30.0–36.0)
MCV: 90.4 fL (ref 80.0–100.0)
Monocytes Absolute: 0.8 10*3/uL (ref 0.1–1.0)
Monocytes Relative: 9 %
Neutro Abs: 6.1 10*3/uL (ref 1.7–7.7)
Neutrophils Relative %: 68 %
Platelets: 480 10*3/uL — ABNORMAL HIGH (ref 150–400)
RBC: 2.82 MIL/uL — ABNORMAL LOW (ref 4.22–5.81)
RDW: 13.8 % (ref 11.5–15.5)
WBC: 8.8 10*3/uL (ref 4.0–10.5)
nRBC: 0 % (ref 0.0–0.2)

## 2020-11-24 LAB — RENAL FUNCTION PANEL
Albumin: 2.2 g/dL — ABNORMAL LOW (ref 3.5–5.0)
Anion gap: 7 (ref 5–15)
BUN: 16 mg/dL (ref 6–20)
CO2: 26 mmol/L (ref 22–32)
Calcium: 8.7 mg/dL — ABNORMAL LOW (ref 8.9–10.3)
Chloride: 102 mmol/L (ref 98–111)
Creatinine, Ser: 1.83 mg/dL — ABNORMAL HIGH (ref 0.61–1.24)
GFR, Estimated: 43 mL/min — ABNORMAL LOW (ref >60)
Glucose, Bld: 119 mg/dL — ABNORMAL HIGH (ref 70–99)
Phosphorus: 3.7 mg/dL (ref 2.5–4.6)
Potassium: 4.3 mmol/L (ref 3.5–5.1)
Sodium: 135 mmol/L (ref 135–145)

## 2020-11-24 LAB — GLUCOSE, CAPILLARY
Glucose-Capillary: 132 mg/dL — ABNORMAL HIGH (ref 70–99)
Glucose-Capillary: 121 mg/dL — ABNORMAL HIGH (ref 70–99)
Glucose-Capillary: 112 mg/dL — ABNORMAL HIGH (ref 70–99)
Glucose-Capillary: 114 mg/dL — ABNORMAL HIGH (ref 70–99)

## 2020-11-24 LAB — MAGNESIUM: Magnesium: 1.7 mg/dL (ref 1.7–2.4)

## 2020-11-24 NOTE — Progress Notes (Addendum)
PROGRESS NOTE    Keith [name]  [id number] DOB: [birth year] DOA: 10/17/2020 [name], NP   Brief Narrative: This 55 year old male with past medical history of HIV, chronic kidney disease stage IIIb, type 2 diabetes mellitus, hypertension, hyperlipidemia, coronary artery disease, status post right below knee amputation who was sent to the ED by his orthopedic surgeon due to concern for left heel ulcer that had failed outpatient therapy.  Subsequently, MRI confirmed osteomyelitis.  He underwent left transtibial amputation by [name]. During the hospitalization, he had worsening renal function.  Nephrology was consulted.  He had temporary dialysis catheter placed and dialysis was started.  Later, he developed hematuria and required fulguration and CBI along with blood transfusions.  Renal function improved; dialysis was stopped and nephrology signed off. Subsequently, his wound dehisced with bleeding requiring blood transfusion.  He underwent left below-knee amputation revision by [name] on 11/17/2020.   Assessment & Plan:   Principal Problem:   AKI (acute kidney injury) ([place]) Active Problems:   Elevated blood pressure reading with diagnosis of hypertension   HIV disease ([place])   Uncontrolled type 2 diabetes mellitus with hyperglycemia (HCC)   Cellulitis   Ulcer of heel due to diabetes mellitus (HCC)   Normochromic normocytic anemia   Cellulitis of left foot   Acute osteomyelitis of left calcaneus (HCC)   Dehiscence of amputation stump (HCC)   Hx of BKA, left (HCC)  Acute kidney injury with metabolic acidosis: > Improved. Patient presented with worsening renal functions and started on dialysis during this admission but was then subsequently stopped and nephrology signed off.   AKI was felt to be due to ATN.  Tunneled dialysis catheter has already been removed. Renal ultrasound was negative for hydronephrosis. Status post Foley catheter placement on 11/08/2020 with clots:  Urology was consulted.   He underwent cystoscopy with fulguration on 11/09/2020 and started on CBI and Proscar.  Subsequently CBI was discontinued Creatinine 2.05 on 11/14/2020 and worsened to 3.15 on 11/15/2020.  Creatinine 1.75 today.  DC'd IV fluids on 11/20/2020. Foley catheter discontinued on 11/20/2020.  Hematuria has improved.   Anemia due to acute blood loss from bladder and left stump Hemoglobin dropped to 6.5 on 11/14/2020 for which patient was transfused 2 units packed red cells.   Hemoglobin again dropped to 6.0 on 11/19/2020; status post 1 more unit of packed red cell transfusion.   Hemoglobin 6.9 on 11/20/2020.  Status post 1 more unit packed red cell transfusion on 11/20/2020. Hb 8.1> 8.4 > 7.9 >8.4 , continue to monitor H&H  Left heel osteomyelitis and abscess status post transtibial amputation on 10/27/2020 with subsequent dehiscence of wound with bleeding. Patient has had significant bleeding from the wound with wound dehiscence requiring blood transfusions. Orthopedics following: Status post revision of left below-knee amputation on 11/17/2020 by [name].   Wound VAC was removed 8/18. Further management as per ortho   Hyponatremia Replaced and improved.   CAD status post stenting in 03/2020 Continue aspirin,  Brilinta discontinued for now since 11/19/2020 as per cardiology recommendations.   Once bleeding stabilizes, will resume Brilinta.  Continue Vascepa and Lipitor  Hyperkalemia Resolved.  HIV Continue current regimen of antiretrovirals and dapsone as per ID.  Outpatient follow-up with ID   Diabetes mellitus type 2 with hyperglycemia Continue CBGs with SSI.  A1c 13.9 on 08/29/2020.  DVT prophylaxis: SCDs Code Status: Full code. Family Communication: None at bed side. Disposition Plan:   Status is: Inpatient  Remains inpatient  appropriate because:Inpatient level of care appropriate due to severity of illness  Dispo:  Patient From:    Planned Disposition: [place]  Medically stable for discharge:       Consultants:  Nephrology, ID, Urology, Ortho  Procedures: Dialysis catheter placement and subsequent removal,  left transtibial amputation Revision of left below-knee amputation on 11/17/2020 by [name].  Antimicrobials:   Anti-infectives (From admission, onward)    Start     Dose/Rate Route Frequency Ordered Stop   11/18/20 0100  ceFAZolin (ANCEF) IVPB 2g/100 mL premix        2 g 200 mL/hr over 30 Minutes Intravenous Every 8 hours 11/17/20 2045 11/18/20 1038   11/17/20 0600  ceFAZolin (ANCEF) IVPB 3g/100 mL premix        3 g 200 mL/hr over 30 Minutes Intravenous On call to O.R. 11/16/20 1941 11/17/20 1901   11/11/20 1200  vancomycin (VANCOREADY) IVPB 500 mg/100 mL  Status:  Discontinued        500 mg 100 mL/hr over 60 Minutes Intravenous Every T-Th-Sa (Hemodialysis) 11/09/20 1414 11/11/20 1328   11/10/20 0000  Dolutegravir-lamiVUDine (DOVATO) 50-300 MG TABS        1 tablet Oral Daily 11/10/20 1048     11/10/20 0000  doravirine (PIFELTRO) 100 MG TABS tablet        100 mg Oral Daily 11/10/20 1048     11/08/20 1000  dolutegravir (TIVICAY) tablet 50 mg        50 mg Oral Daily 11/07/20 1451     11/08/20 1000  lamiVUDine (EPIVIR) tablet 300 mg        300 mg Oral Daily 11/07/20 1451     11/08/20 1000  doravirine (PIFELTRO) tablet 100 mg        100 mg Oral Daily 11/07/20 1451     11/02/20 1130  ceFAZolin (ANCEF) IVPB 2g/100 mL premix        2 g 200 mL/hr over 30 Minutes Intravenous To Radiology 11/02/20 1042 11/02/20 1704   10/27/20 1245  ceFAZolin (ANCEF) IVPB 3g/100 mL premix        3 g 200 mL/hr over 30 Minutes Intravenous On call to O.R. 10/27/20 0755 10/27/20 1608   10/20/20 1415  linezolid (ZYVOX) tablet 600 mg  Status:  Discontinued        600 mg Oral Every 12 hours 10/20/20 1326 10/28/20 1115   10/19/20 1445  metroNIDAZOLE (FLAGYL) tablet 500 mg  Status:  Discontinued        500 mg Oral Every 12 hours 10/19/20 1347  10/28/20 1115   10/18/20 1600  ceFEPIme (MAXIPIME) 2 g in sodium chloride 0.9 % 100 mL IVPB  Status:  Discontinued        2 g 200 mL/hr over 30 Minutes Intravenous Every 12 hours 10/18/20 0414 10/28/20 1112   10/18/20 1000  dapsone tablet 100 mg        100 mg Oral Daily 10/18/20 0425     10/18/20 1000  bictegravir-emtricitabine-tenofovir AF (BIKTARVY) 50-200-25 MG per tablet 1 tablet  Status:  Discontinued        1 tablet Oral Daily 10/18/20 0610 11/07/20 1451   10/18/20 0415  metroNIDAZOLE (FLAGYL) IVPB 500 mg  Status:  Discontinued        500 mg 100 mL/hr over 60 Minutes Intravenous Every 8 hours 10/18/20 0404 10/19/20 1347   10/18/20 0415  ceFEPIme (MAXIPIME) 2 g in sodium chloride 0.9 % 100 mL IVPB  2 g 200 mL/hr over 30 Minutes Intravenous  Once 10/18/20 0414 10/18/20 0500   10/18/20 0345  vancomycin (VANCOREADY) IVPB 2000 mg/400 mL        2,000 mg 200 mL/hr over 120 Minutes Intravenous  Once 10/18/20 0335 10/18/20 [id number]        Subjective: Patient was seen and examined at bedside.  Overnight events noted. Patient is lying comfortably in the bed.  Patient was noted bleeding from left stump Wound VAC was removed yesterday.  Orthopedics was notified.  Objective: Vitals:   11/23/20 0904 11/23/20 1632 11/23/20 2144 11/24/20 0659  BP: (!) 144/97 117/84 (!) 156/78 100/71  Pulse: 78 73 68 92  Resp: '18 18 18   '$ Temp: 98.1 F (36.7 C) 98 F (36.7 C) 98.4 F (36.9 C)   TempSrc: Oral Oral    SpO2: 100% 100% 98% 99%  Weight:   121.4 kg   Height:        Intake/Output Summary (Last 24 hours) at 11/24/2020 1502 Last data filed at 11/24/2020 0800 Gross per 24 hour  Intake 120 ml  Output 1350 ml  Net -1230 ml   Filed Weights   11/20/20 0618 11/23/20 0618 11/23/20 2144  Weight: 117.3 kg 121.4 kg 121.4 kg    Examination:  General exam: Appears comfortable, not in any acute distress. Respiratory system: Clear to auscultation bilaterally, no wheezes. Cardiovascular system:  S1-S2 heard, regular rate and rhythm, no murmur. Gastrointestinal system: Abdomen is soft, nontender, nondistended.  Normal bowel sounds heard. Central nervous system: Alert and oriented x2. No focal neurological deficits. Extremities: Right BKA, left BKA, oozing noted in dressing Skin: No rashes, lesions or ulcers Psychiatry: Mood and affect appropriate.    Data Reviewed: I have personally reviewed following labs and imaging studies  CBC: Recent Labs  Lab 11/20/20 0841 11/21/20 0425 11/22/20 0511 11/23/20 0239 11/24/20 0422  WBC 11.2* 9.4 10.2 9.7 8.8  NEUTROABS 8.4* 6.8 7.3 7.3 6.1  HGB 6.9* 8.1* 8.4* 7.9* 8.4*  HCT 20.7* 23.9* 26.0* 23.9* 25.5*  MCV 90.4 90.9 91.2 90.5 90.4  PLT 294 349 423* 460* 0000000*   Basic Metabolic Panel: Recent Labs  Lab 11/20/20 0454 11/21/20 0425 11/22/20 0511 11/23/20 0239 11/24/20 0422  NA 135 134* 135 135  133* 135  K 4.0 4.0 4.0 4.3  4.2 4.3  CL 107 103 104 100  103 102  CO2 21* 21* '23 23  23 26  '$ GLUCOSE 113* 120* 112* 114*  121* 119*  BUN '20 18 16 17  18 16  '$ CREATININE 2.08* 1.87* 1.79* 1.86*  1.75* 1.83*  CALCIUM 8.1* 8.5* 8.7* 9.2  8.7* 8.7*  MG 1.8 1.8 1.8 1.7 1.7  PHOS 3.6 3.3 3.4 3.5 3.7   GFR: Estimated Creatinine Clearance: 58.9 mL/min (A) (by C-G formula based on SCr of 1.83 mg/dL (H)). Liver Function Tests: Recent Labs  Lab 11/20/20 0454 11/21/20 0425 11/22/20 0511 11/23/20 0239 11/24/20 0422  ALBUMIN 2.1* 2.1* 2.2* 2.2* 2.2*   No results for input(s): LIPASE, AMYLASE in the last 168 hours. No results for input(s): AMMONIA in the last 168 hours. Coagulation Profile: No results for input(s): INR, PROTIME in the last 168 hours. Cardiac Enzymes: No results for input(s): CKTOTAL, CKMB, CKMBINDEX, TROPONINI in the last 168 hours. BNP (last 3 results) No results for input(s): PROBNP in the last 8760 hours. HbA1C: No results for input(s): HGBA1C in the last 72 hours. CBG: Recent Labs  Lab 11/23/20 0733  11/23/20 1631 11/23/20 2144 11/24/20  0703 11/24/20 1156  GLUCAP 127* 131* 129* 132* 121*   Lipid Profile: No results for input(s): CHOL, HDL, LDLCALC, TRIG, CHOLHDL, LDLDIRECT in the last 72 hours. Thyroid Function Tests: No results for input(s): TSH, T4TOTAL, FREET4, T3FREE, THYROIDAB in the last 72 hours. Anemia Panel: No results for input(s): VITAMINB12, FOLATE, FERRITIN, TIBC, IRON, RETICCTPCT in the last 72 hours. Sepsis Labs: No results for input(s): PROCALCITON, LATICACIDVEN in the last 168 hours.  No results found for this or any previous visit (from the past 240 hour(s)).   Radiology Studies: No results found.  Scheduled Meds:  allopurinol  100 mg Oral Daily   vitamin C  1,000 mg Oral Daily   aspirin EC  81 mg Oral Daily   atorvastatin  80 mg Oral Daily   dapsone  100 mg Oral Daily   docusate sodium  100 mg Oral Daily   dolutegravir  50 mg Oral Daily   doravirine  100 mg Oral Daily   empagliflozin  10 mg Oral Daily   escitalopram  20 mg Oral Daily   finasteride  5 mg Oral Daily   gabapentin  100 mg Oral TID   icosapent Ethyl  2 g Oral BID   insulin aspart  0-5 Units Subcutaneous QHS   lamiVUDine  300 mg Oral Daily   nutrition supplement (JUVEN)  1 packet Oral BID BM   oxybutynin  2.5 mg Oral BID   oxyCODONE  10 mg Oral QID   pantoprazole  40 mg Oral Daily   senna  2 tablet Oral QHS   zinc sulfate  220 mg Oral Daily   Continuous Infusions:  magnesium sulfate bolus IVPB       LOS: 37 days    Time spent: 25 mins    Shawna Clamp, MD [facility]   If 7PM-7AM, please contact night-coverage

## 2020-11-24 NOTE — Progress Notes (Signed)
Patient is status post below-knee amputation revision on the left.  Wound VAC was removed yesterday.  He had some bleeding but not much.  Was given call that he is having quite a bit of active bleeding from his stump   Dressing was removed he had significant bleeding.  He has had a recurrent wound dehiscence about 2 cm in length.  Patient was started on heparin yesterday.  Have asked for them to hold heparin and asked the hospitalist if we can hold the heparin in hopes of slowing down his bleeding.  [name] aware

## 2020-11-25 DIAGNOSIS — N179 Acute kidney failure, unspecified: Secondary | ICD-10-CM | POA: Diagnosis not present

## 2020-11-25 LAB — GLUCOSE, CAPILLARY
Glucose-Capillary: 153 mg/dL — ABNORMAL HIGH (ref 70–99)
Glucose-Capillary: 124 mg/dL — ABNORMAL HIGH (ref 70–99)
Glucose-Capillary: 166 mg/dL — ABNORMAL HIGH (ref 70–99)
Glucose-Capillary: 136 mg/dL — ABNORMAL HIGH (ref 70–99)

## 2020-11-25 LAB — CBC WITH DIFFERENTIAL/PLATELET
Abs Immature Granulocytes: 0.07 10*3/uL (ref 0.00–0.07)
Basophils Absolute: 0.1 10*3/uL (ref 0.0–0.1)
Basophils Relative: 1 %
Eosinophils Absolute: 0.3 10*3/uL (ref 0.0–0.5)
Eosinophils Relative: 2 %
HCT: 22.9 % — ABNORMAL LOW (ref 39.0–52.0)
Hemoglobin: 7.6 g/dL — ABNORMAL LOW (ref 13.0–17.0)
Immature Granulocytes: 1 %
Lymphocytes Relative: 14 %
Lymphs Abs: 1.5 10*3/uL (ref 0.7–4.0)
MCH: 29.9 pg (ref 26.0–34.0)
MCHC: 33.2 g/dL (ref 30.0–36.0)
MCV: 90.2 fL (ref 80.0–100.0)
Monocytes Absolute: 0.8 10*3/uL (ref 0.1–1.0)
Monocytes Relative: 8 %
Neutro Abs: 8.2 10*3/uL — ABNORMAL HIGH (ref 1.7–7.7)
Neutrophils Relative %: 74 %
Platelets: 489 10*3/uL — ABNORMAL HIGH (ref 150–400)
RBC: 2.54 MIL/uL — ABNORMAL LOW (ref 4.22–5.81)
RDW: 13.8 % (ref 11.5–15.5)
WBC: 10.8 10*3/uL — ABNORMAL HIGH (ref 4.0–10.5)
nRBC: 0 % (ref 0.0–0.2)

## 2020-11-25 LAB — RENAL FUNCTION PANEL
Albumin: 2.2 g/dL — ABNORMAL LOW (ref 3.5–5.0)
Anion gap: 9 (ref 5–15)
BUN: 23 mg/dL — ABNORMAL HIGH (ref 6–20)
CO2: 21 mmol/L — ABNORMAL LOW (ref 22–32)
Calcium: 8.3 mg/dL — ABNORMAL LOW (ref 8.9–10.3)
Chloride: 101 mmol/L (ref 98–111)
Creatinine, Ser: 1.97 mg/dL — ABNORMAL HIGH (ref 0.61–1.24)
GFR, Estimated: 39 mL/min — ABNORMAL LOW (ref >60)
Glucose, Bld: 126 mg/dL — ABNORMAL HIGH (ref 70–99)
Phosphorus: 3.7 mg/dL (ref 2.5–4.6)
Potassium: 3.9 mmol/L (ref 3.5–5.1)
Sodium: 131 mmol/L — ABNORMAL LOW (ref 135–145)

## 2020-11-25 LAB — HEMOGLOBIN AND HEMATOCRIT, BLOOD
HCT: 22.4 % — ABNORMAL LOW (ref 39.0–52.0)
Hemoglobin: 7.2 g/dL — ABNORMAL LOW (ref 13.0–17.0)

## 2020-11-25 LAB — MAGNESIUM: Magnesium: 1.8 mg/dL (ref 1.7–2.4)

## 2020-11-25 NOTE — Progress Notes (Signed)
PROGRESS NOTE    Keith [name]  [id number] DOB: [birth year] DOA: 10/17/2020 [name], NP   Brief Narrative: This 55 year old male with past medical history of HIV, chronic kidney disease stage IIIb, type 2 diabetes mellitus, hypertension, hyperlipidemia, coronary artery disease, status post right below knee amputation who was sent to the ED by his orthopedic surgeon due to concern for left heel ulcer that had failed outpatient therapy.  Subsequently, MRI confirmed osteomyelitis.  He underwent left transtibial amputation by [name]. During the hospitalization, he had worsening renal function.  Nephrology was consulted.  He had temporary dialysis catheter placed and dialysis was started.  Later, he developed hematuria and required fulguration and CBI along with blood transfusions.  Renal function improved; dialysis was stopped and nephrology signed off. Subsequently, his wound dehisced with bleeding requiring blood transfusion.  He underwent left below-knee amputation revision by [name] on 11/17/2020.   Assessment & Plan:   Principal Problem:   AKI (acute kidney injury) ([place]) Active Problems:   Elevated blood pressure reading with diagnosis of hypertension   HIV disease ([place])   Uncontrolled type 2 diabetes mellitus with hyperglycemia (HCC)   Cellulitis   Ulcer of heel due to diabetes mellitus (HCC)   Normochromic normocytic anemia   Cellulitis of left foot   Acute osteomyelitis of left calcaneus (HCC)   Dehiscence of amputation stump (HCC)   Hx of BKA, left (HCC)  Acute kidney injury with metabolic acidosis: > Improved. Patient presented with worsening renal functions and started on dialysis during this admission but was then subsequently stopped and nephrology signed off.   AKI was felt to be due to ATN.  Tunneled dialysis catheter has already been removed. Renal ultrasound was negative for hydronephrosis. Status post Foley catheter placement on 11/08/2020 with clots:  Urology was consulted.   He underwent cystoscopy with fulguration on 11/09/2020 and started on CBI and Proscar.  Subsequently CBI was discontinued Creatinine 2.05 on 11/14/2020 and worsened to 3.15 on 11/15/2020.  Creatinine 1.75 today.  DC'd IV fluids on 11/20/2020. Foley catheter discontinued on 11/20/2020.  Hematuria has improved.   Anemia due to acute blood loss from bladder and left stump Hemoglobin dropped to 6.5 on 11/14/2020 for which patient was transfused 2 units packed red cells.   Hemoglobin again dropped to 6.0 on 11/19/2020; status post 1 more unit of packed red cell transfusion.   Hemoglobin 6.9 on 11/20/2020.  Status post 1 more unit packed red cell transfusion on 11/20/2020. Hb 8.1> 8.4 > 7.9 >8.4>7.6 , continue to monitor H & H  Left heel osteomyelitis and abscess status post transtibial amputation on 10/27/2020 with subsequent dehiscence of wound with bleeding. Patient has had significant bleeding from the wound with wound dehiscence requiring blood transfusions. Orthopedics following: Status post revision of left below-knee amputation on 11/17/2020 by [name].   Wound VAC was removed 8/18. Further management as per ortho   Hyponatremia Replaced, Continue to monitor.   CAD status post stenting in 03/2020 Continue aspirin,  Brilinta discontinued for now since 11/19/2020 as per cardiology recommendations.   Once bleeding stabilizes, will resume Brilinta.  Continue Vascepa and Lipitor  Hyperkalemia Resolved.  HIV Continue current regimen of antiretrovirals and dapsone as per ID.  Outpatient follow-up with ID   Diabetes mellitus type 2 with hyperglycemia Continue CBGs with SSI.  A1c 13.9 on 08/29/2020.  DVT prophylaxis: SCDs Code Status: Full code. Family Communication: None at bed side. Disposition Plan:   Status is: Inpatient  Remains inpatient appropriate because:Inpatient level of care appropriate due to severity of illness  Dispo:  Patient From:   Home  Planned  Disposition: [place]  Medically stable for discharge:       Consultants:  Nephrology, ID, Urology, Ortho  Procedures: Dialysis catheter placement and subsequent removal,  left transtibial amputation Revision of left below-knee amputation on 11/17/2020 by [name].  Antimicrobials:   Anti-infectives (From admission, onward)    Start     Dose/Rate Route Frequency Ordered Stop   11/18/20 0100  ceFAZolin (ANCEF) IVPB 2g/100 mL premix        2 g 200 mL/hr over 30 Minutes Intravenous Every 8 hours 11/17/20 2045 11/18/20 1038   11/17/20 0600  ceFAZolin (ANCEF) IVPB 3g/100 mL premix        3 g 200 mL/hr over 30 Minutes Intravenous On call to O.R. 11/16/20 1941 11/17/20 1901   11/11/20 1200  vancomycin (VANCOREADY) IVPB 500 mg/100 mL  Status:  Discontinued        500 mg 100 mL/hr over 60 Minutes Intravenous Every T-Th-Sa (Hemodialysis) 11/09/20 1414 11/11/20 1328   11/10/20 0000  Dolutegravir-lamiVUDine (DOVATO) 50-300 MG TABS        1 tablet Oral Daily 11/10/20 1048     11/10/20 0000  doravirine (PIFELTRO) 100 MG TABS tablet        100 mg Oral Daily 11/10/20 1048     11/08/20 1000  dolutegravir (TIVICAY) tablet 50 mg        50 mg Oral Daily 11/07/20 1451     11/08/20 1000  lamiVUDine (EPIVIR) tablet 300 mg        300 mg Oral Daily 11/07/20 1451     11/08/20 1000  doravirine (PIFELTRO) tablet 100 mg        100 mg Oral Daily 11/07/20 1451     11/02/20 1130  ceFAZolin (ANCEF) IVPB 2g/100 mL premix        2 g 200 mL/hr over 30 Minutes Intravenous To Radiology 11/02/20 1042 11/02/20 1704   10/27/20 1245  ceFAZolin (ANCEF) IVPB 3g/100 mL premix        3 g 200 mL/hr over 30 Minutes Intravenous On call to O.R. 10/27/20 0755 10/27/20 1608   10/20/20 1415  linezolid (ZYVOX) tablet 600 mg  Status:  Discontinued        600 mg Oral Every 12 hours 10/20/20 1326 10/28/20 1115   10/19/20 1445  metroNIDAZOLE (FLAGYL) tablet 500 mg  Status:  Discontinued        500 mg Oral Every 12  hours 10/19/20 1347 10/28/20 1115   10/18/20 1600  ceFEPIme (MAXIPIME) 2 g in sodium chloride 0.9 % 100 mL IVPB  Status:  Discontinued        2 g 200 mL/hr over 30 Minutes Intravenous Every 12 hours 10/18/20 0414 10/28/20 1112   10/18/20 1000  dapsone tablet 100 mg        100 mg Oral Daily 10/18/20 0425     10/18/20 1000  bictegravir-emtricitabine-tenofovir AF (BIKTARVY) 50-200-25 MG per tablet 1 tablet  Status:  Discontinued        1 tablet Oral Daily 10/18/20 0610 11/07/20 1451   10/18/20 0415  metroNIDAZOLE (FLAGYL) IVPB 500 mg  Status:  Discontinued        500 mg 100 mL/hr over 60 Minutes Intravenous Every 8 hours 10/18/20 0404 10/19/20 1347   10/18/20 0415  ceFEPIme (MAXIPIME) 2 g in sodium chloride 0.9 % 100 mL IVPB  2 g 200 mL/hr over 30 Minutes Intravenous  Once 10/18/20 0414 10/18/20 0500   10/18/20 0345  vancomycin (VANCOREADY) IVPB 2000 mg/400 mL        2,000 mg 200 mL/hr over 120 Minutes Intravenous  Once 10/18/20 0335 10/18/20 [id number]        Subjective: Patient was seen and examined at bedside.  Overnight events noted. Patient is lying comfortably in the bed.  He reports pain is better controlled. Wound VAC was removed 8/19.  Awaiting SNF placement.  Objective: Vitals:   11/24/20 1627 11/24/20 2133 11/25/20 0542 11/25/20 0922  BP: 133/78 137/81 119/68 110/72  Pulse: 84 79 90 87  Resp: '18 16 16 18  '$ Temp: 98.5 F (36.9 C) 98.1 F (36.7 C) 98.3 F (36.8 C) 98 F (36.7 C)  TempSrc: Oral Oral Oral   SpO2: 100% 100% 97% 97%  Weight:      Height:        Intake/Output Summary (Last 24 hours) at 11/25/2020 1433 Last data filed at 11/25/2020 1300 Gross per 24 hour  Intake 717 ml  Output 1275 ml  Net -558 ml   Filed Weights   11/20/20 0618 11/23/20 0618 11/23/20 2144  Weight: 117.3 kg 121.4 kg 121.4 kg    Examination:  General exam: Appears comfortable, not in any acute distress. Respiratory system: Clear to auscultation bilaterally, no  wheezes. Cardiovascular system: S1-S2 heard, regular rate and rhythm, no murmur. Gastrointestinal system: Abdomen is soft, nontender, nondistended.  Normal bowel sounds heard. Central nervous system: Alert and oriented x2. No focal neurological deficits. Extremities: Right BKA, left BKA, oozing noted in dressing Skin: No rashes, lesions or ulcers Psychiatry: Mood and affect appropriate.    Data Reviewed: I have personally reviewed following labs and imaging studies  CBC: Recent Labs  Lab 11/21/20 0425 11/22/20 0511 11/23/20 0239 11/24/20 0422 11/25/20 0734  WBC 9.4 10.2 9.7 8.8 10.8*  NEUTROABS 6.8 7.3 7.3 6.1 8.2*  HGB 8.1* 8.4* 7.9* 8.4* 7.6*  HCT 23.9* 26.0* 23.9* 25.5* 22.9*  MCV 90.9 91.2 90.5 90.4 90.2  PLT 349 423* 460* 480* 0000000*   Basic Metabolic Panel: Recent Labs  Lab 11/21/20 0425 11/22/20 0511 11/23/20 0239 11/24/20 0422 11/25/20 0734  NA 134* 135 135  133* 135 131*  K 4.0 4.0 4.3  4.2 4.3 3.9  CL 103 104 100  103 102 101  CO2 21* '23 23  23 26 '$ 21*  GLUCOSE 120* 112* 114*  121* 119* 126*  BUN '18 16 17  18 16 '$ 23*  CREATININE 1.87* 1.79* 1.86*  1.75* 1.83* 1.97*  CALCIUM 8.5* 8.7* 9.2  8.7* 8.7* 8.3*  MG 1.8 1.8 1.7 1.7 1.8  PHOS 3.3 3.4 3.5 3.7 3.7   GFR: Estimated Creatinine Clearance: 54.7 mL/min (A) (by C-G formula based on SCr of 1.97 mg/dL (H)). Liver Function Tests: Recent Labs  Lab 11/21/20 0425 11/22/20 0511 11/23/20 0239 11/24/20 0422 11/25/20 0734  ALBUMIN 2.1* 2.2* 2.2* 2.2* 2.2*   No results for input(s): LIPASE, AMYLASE in the last 168 hours. No results for input(s): AMMONIA in the last 168 hours. Coagulation Profile: No results for input(s): INR, PROTIME in the last 168 hours. Cardiac Enzymes: No results for input(s): CKTOTAL, CKMB, CKMBINDEX, TROPONINI in the last 168 hours. BNP (last 3 results) No results for input(s): PROBNP in the last 8760 hours. HbA1C: No results for input(s): HGBA1C in the last 72  hours. CBG: Recent Labs  Lab 11/24/20 1156 11/24/20 1626 11/24/20 2038 11/25/20 [id number]  11/25/20 1136  GLUCAP 121* 112* 114* 153* 124*   Lipid Profile: No results for input(s): CHOL, HDL, LDLCALC, TRIG, CHOLHDL, LDLDIRECT in the last 72 hours. Thyroid Function Tests: No results for input(s): TSH, T4TOTAL, FREET4, T3FREE, THYROIDAB in the last 72 hours. Anemia Panel: No results for input(s): VITAMINB12, FOLATE, FERRITIN, TIBC, IRON, RETICCTPCT in the last 72 hours. Sepsis Labs: No results for input(s): PROCALCITON, LATICACIDVEN in the last 168 hours.  No results found for this or any previous visit (from the past 240 hour(s)).   Radiology Studies: No results found.  Scheduled Meds:  allopurinol  100 mg Oral Daily   vitamin C  1,000 mg Oral Daily   aspirin EC  81 mg Oral Daily   atorvastatin  80 mg Oral Daily   dapsone  100 mg Oral Daily   docusate sodium  100 mg Oral Daily   dolutegravir  50 mg Oral Daily   doravirine  100 mg Oral Daily   empagliflozin  10 mg Oral Daily   escitalopram  20 mg Oral Daily   finasteride  5 mg Oral Daily   gabapentin  100 mg Oral TID   icosapent Ethyl  2 g Oral BID   insulin aspart  0-5 Units Subcutaneous QHS   lamiVUDine  300 mg Oral Daily   nutrition supplement (JUVEN)  1 packet Oral BID BM   oxybutynin  2.5 mg Oral BID   oxyCODONE  10 mg Oral QID   pantoprazole  40 mg Oral Daily   senna  2 tablet Oral QHS   zinc sulfate  220 mg Oral Daily   Continuous Infusions:  magnesium sulfate bolus IVPB       LOS: 38 days    Time spent: 25 mins    Shawna Clamp, MD [facility]   If 7PM-7AM, please contact night-coverage

## 2020-11-26 DIAGNOSIS — N179 Acute kidney failure, unspecified: Secondary | ICD-10-CM | POA: Diagnosis not present

## 2020-11-26 LAB — CBC WITH DIFFERENTIAL/PLATELET
Abs Immature Granulocytes: 0.14 10*3/uL — ABNORMAL HIGH (ref 0.00–0.07)
Basophils Absolute: 0 10*3/uL (ref 0.0–0.1)
Basophils Relative: 0 %
Eosinophils Absolute: 0.3 10*3/uL (ref 0.0–0.5)
Eosinophils Relative: 2 %
HCT: 20.6 % — ABNORMAL LOW (ref 39.0–52.0)
Hemoglobin: 6.8 g/dL — CL (ref 13.0–17.0)
Immature Granulocytes: 1 %
Lymphocytes Relative: 13 %
Lymphs Abs: 1.7 10*3/uL (ref 0.7–4.0)
MCH: 30 pg (ref 26.0–34.0)
MCHC: 33 g/dL (ref 30.0–36.0)
MCV: 90.7 fL (ref 80.0–100.0)
Monocytes Absolute: 1.1 10*3/uL — ABNORMAL HIGH (ref 0.1–1.0)
Monocytes Relative: 9 %
Neutro Abs: 10.1 10*3/uL — ABNORMAL HIGH (ref 1.7–7.7)
Neutrophils Relative %: 75 %
Platelets: 476 10*3/uL — ABNORMAL HIGH (ref 150–400)
RBC: 2.27 MIL/uL — ABNORMAL LOW (ref 4.22–5.81)
RDW: 13.6 % (ref 11.5–15.5)
WBC: 13.4 10*3/uL — ABNORMAL HIGH (ref 4.0–10.5)
nRBC: 0 % (ref 0.0–0.2)

## 2020-11-26 LAB — RENAL FUNCTION PANEL
Albumin: 2.1 g/dL — ABNORMAL LOW (ref 3.5–5.0)
Anion gap: 9 (ref 5–15)
BUN: 24 mg/dL — ABNORMAL HIGH (ref 6–20)
CO2: 22 mmol/L (ref 22–32)
Calcium: 8.1 mg/dL — ABNORMAL LOW (ref 8.9–10.3)
Chloride: 101 mmol/L (ref 98–111)
Creatinine, Ser: 1.95 mg/dL — ABNORMAL HIGH (ref 0.61–1.24)
GFR, Estimated: 40 mL/min — ABNORMAL LOW (ref >60)
Glucose, Bld: 163 mg/dL — ABNORMAL HIGH (ref 70–99)
Phosphorus: 3 mg/dL (ref 2.5–4.6)
Potassium: 4.2 mmol/L (ref 3.5–5.1)
Sodium: 132 mmol/L — ABNORMAL LOW (ref 135–145)

## 2020-11-26 LAB — GLUCOSE, CAPILLARY
Glucose-Capillary: 172 mg/dL — ABNORMAL HIGH (ref 70–99)
Glucose-Capillary: 123 mg/dL — ABNORMAL HIGH (ref 70–99)
Glucose-Capillary: 161 mg/dL — ABNORMAL HIGH (ref 70–99)

## 2020-11-26 LAB — MAGNESIUM: Magnesium: 1.7 mg/dL (ref 1.7–2.4)

## 2020-11-26 LAB — PREPARE RBC (CROSSMATCH)

## 2020-11-26 MED ORDER — SODIUM CHLORIDE 0.9% IV SOLUTION: Freq: Once | INTRAVENOUS | Status: AC

## 2020-11-26 NOTE — Progress Notes (Signed)
Hbg is 6.8 this morning. Notified Dr. Olevia Bowens and awaiting for a response. Will continue to monitor.

## 2020-11-26 NOTE — TOC Progression Note (Signed)
Transition of Care [facility]) - Progression Note    Patient Details  Name: Keith [name] MRN: [name] Date of Birth: 1965/09/05  Transition [facility]) CM/SW Contact  Joanne Chars, LCSW Phone Number: 11/26/2020, 2:33 PM  Clinical Narrative:   CSW spoke with pt regarding bed offer at [facility].  Pt states he does not want to go to [place] as his mother would not have transportation to visit him there.  Discussed [facility] again, pt continues to say he does not want to accept that offer has he was there before and did not like it.  Informed pt that [facility] was not able to make a bed offer.  Pt is going to discuss options with his mother.    Expected Discharge Plan: Skilled Nursing Facility Barriers to Discharge: SNF Pending bed offer  Expected Discharge Plan and Services Expected Discharge Plan: [place] In-house Referral: Clinical Social Work Discharge Planning Services: CM Consult Post Acute Care Choice: [place] arrangements for the past 2 months: Single Family Home                                       Social Determinants of Health (SDOH) Interventions    Readmission Risk Interventions Readmission Risk Prevention Plan 10/21/2020 11/13/2018 11/13/2018  Transportation Screening Complete - Complete  PCP or Specialist Appt within 3-5 Days - - -  HRI or [place] Work Consult for [place] - - -  Medication Review Press photographer) Complete - Complete  PCP or Specialist appointment within 3-5 days of discharge Complete Complete -  [place] or Home Care Consult Complete Complete -  SW Recovery Care/Counseling Consult Complete Complete -  Palliative Care Screening Not Applicable Not Applicable -  [place] Complete Not Applicable -  Some encounter information is confidential and restricted. Go to Review Flowsheets activity to  see all data.  Some recent data might be hidden

## 2020-11-26 NOTE — Progress Notes (Signed)
[facility] night shift MedSurg coverage note.  The nursing staff is reported that the patient's hemoglobin level was 6.8 g/dL on this morning's CBC.  Type and screen and 1 unit of PRBC to be transfused ordered.  Tennis Must, MD.

## 2020-11-26 NOTE — Plan of Care (Signed)
?  Problem: Clinical Measurements: ?Goal: Will remain free from infection ?Outcome: Progressing ?  ?

## 2020-11-26 NOTE — Progress Notes (Signed)
Moderate amount f bleeding observed on patient left stump,reinforced dressing applied. Two hours removed the old dressing and  placed new pressure dressing.Bleeder coming from dehiscence area of the stump.

## 2020-11-26 NOTE — Progress Notes (Signed)
PROGRESS NOTE    Keith [name]  [id number] DOB: 1965-05-16 DOA: 10/17/2020 [name], NP   Brief Narrative: This 55 year old male with past medical history of HIV, chronic kidney disease stage IIIb, type 2 diabetes mellitus, hypertension, hyperlipidemia, coronary artery disease, status post right below knee amputation who was sent to the ED by his orthopedic surgeon due to concern for left heel ulcer that had failed outpatient therapy.  Subsequently, MRI confirmed osteomyelitis.  He underwent left transtibial amputation by [name]. During the hospitalization, he had worsening renal function.  Nephrology was consulted.  He had temporary dialysis catheter placed and dialysis was started.  Later, he developed hematuria and required fulguration and CBI along with blood transfusions.  Renal function improved; dialysis was stopped and nephrology signed off. Subsequently, his wound dehisced with bleeding requiring blood transfusion.  He underwent left below-knee amputation revision by [name] on 11/17/2020.   Assessment & Plan:   Principal Problem:   AKI (acute kidney injury) ([place]) Active Problems:   Elevated blood pressure reading with diagnosis of hypertension   HIV disease ([place])   Uncontrolled type 2 diabetes mellitus with hyperglycemia (HCC)   Cellulitis   Ulcer of heel due to diabetes mellitus (HCC)   Normochromic normocytic anemia   Cellulitis of left foot   Acute osteomyelitis of left calcaneus (HCC)   Dehiscence of amputation stump (HCC)   Hx of BKA, left (HCC)  Acute kidney injury with metabolic acidosis: > Improved. Patient presented with worsening renal functions and started on dialysis during this admission but was then subsequently stopped and nephrology signed off.   AKI was felt to be due to ATN.  Tunneled dialysis catheter has already been removed. Renal ultrasound was negative for hydronephrosis. Status post Foley catheter placement on 11/08/2020 with clots:  Urology was consulted.   He underwent cystoscopy with fulguration on 11/09/2020 and started on CBI and Proscar.  Subsequently CBI was discontinued Creatinine 2.05 on 11/14/2020 and worsened to 3.15 on 11/15/2020.  Creatinine 1.95 today.  DC'd IV fluids on 11/20/2020. Foley catheter discontinued on 11/20/2020.  Hematuria has improved.   Anemia due to acute blood loss from bladder and left stump Hemoglobin dropped to 6.5 on 11/14/2020 for which patient was transfused 2 units packed red cells.   Hemoglobin again dropped to 6.0 on 11/19/2020; status post 1 more unit of packed red cell transfusion.   Hemoglobin 6.9 on 11/20/2020.  Status post 1 more unit packed red cell transfusion on 11/20/2020. Hb 8.1> 8.4 > 7.9 >8.4>7.6 >6.8, transfuse  1 PRBC, f/u post transfusion cbc.  Left heel osteomyelitis and abscess status post transtibial amputation on 10/27/2020 with subsequent dehiscence of wound with bleeding. Patient has had significant bleeding from the wound with wound dehiscence requiring blood transfusions. Orthopedics following: Status post revision of left below-knee amputation on 11/17/2020 by [name].   Wound VAC was removed on 8/18. Further management as per ortho.   Hyponatremia Replaced, Continue to monitor.   CAD status post stenting in 03/2020 Continue aspirin,  Brilinta discontinued for now since 11/19/2020 as per cardiology recommendations.   Once bleeding stabilizes, will resume Brilinta.  Continue Vascepa and Lipitor  Hyperkalemia Resolved.  HIV Continue current regimen of antiretrovirals and dapsone as per ID.  Outpatient follow-up with ID   Diabetes mellitus type 2 with hyperglycemia Continue CBGs with SSI.  A1c 13.9 on 08/29/2020.  DVT prophylaxis: SCDs Code Status: Full code. Family Communication: None at bed side. Disposition Plan:  Status is: Inpatient  Remains inpatient appropriate because:Inpatient level of care appropriate due to severity of illness  Dispo:  Patient  From:   Home  Planned Disposition: [place]  Medically stable for discharge:       Consultants:  Nephrology, ID, Urology, Ortho  Procedures: Dialysis catheter placement and subsequent removal,  left transtibial amputation Revision of left below-knee amputation on 11/17/2020 by [name].  Antimicrobials:   Anti-infectives (From admission, onward)    Start     Dose/Rate Route Frequency Ordered Stop   11/18/20 0100  ceFAZolin (ANCEF) IVPB 2g/100 mL premix        2 g 200 mL/hr over 30 Minutes Intravenous Every 8 hours 11/17/20 2045 11/18/20 1038   11/17/20 0600  ceFAZolin (ANCEF) IVPB 3g/100 mL premix        3 g 200 mL/hr over 30 Minutes Intravenous On call to O.R. 11/16/20 1941 11/17/20 1901   11/11/20 1200  vancomycin (VANCOREADY) IVPB 500 mg/100 mL  Status:  Discontinued        500 mg 100 mL/hr over 60 Minutes Intravenous Every T-Th-Sa (Hemodialysis) 11/09/20 1414 11/11/20 1328   11/10/20 0000  Dolutegravir-lamiVUDine (DOVATO) 50-300 MG TABS        1 tablet Oral Daily 11/10/20 1048     11/10/20 0000  doravirine (PIFELTRO) 100 MG TABS tablet        100 mg Oral Daily 11/10/20 1048     11/08/20 1000  dolutegravir (TIVICAY) tablet 50 mg        50 mg Oral Daily 11/07/20 1451     11/08/20 1000  lamiVUDine (EPIVIR) tablet 300 mg        300 mg Oral Daily 11/07/20 1451     11/08/20 1000  doravirine (PIFELTRO) tablet 100 mg        100 mg Oral Daily 11/07/20 1451     11/02/20 1130  ceFAZolin (ANCEF) IVPB 2g/100 mL premix        2 g 200 mL/hr over 30 Minutes Intravenous To Radiology 11/02/20 1042 11/02/20 1704   10/27/20 1245  ceFAZolin (ANCEF) IVPB 3g/100 mL premix        3 g 200 mL/hr over 30 Minutes Intravenous On call to O.R. 10/27/20 0755 10/27/20 1608   10/20/20 1415  linezolid (ZYVOX) tablet 600 mg  Status:  Discontinued        600 mg Oral Every 12 hours 10/20/20 1326 10/28/20 1115   10/19/20 1445  metroNIDAZOLE (FLAGYL) tablet 500 mg  Status:  Discontinued         500 mg Oral Every 12 hours 10/19/20 1347 10/28/20 1115   10/18/20 1600  ceFEPIme (MAXIPIME) 2 g in sodium chloride 0.9 % 100 mL IVPB  Status:  Discontinued        2 g 200 mL/hr over 30 Minutes Intravenous Every 12 hours 10/18/20 0414 10/28/20 1112   10/18/20 1000  dapsone tablet 100 mg        100 mg Oral Daily 10/18/20 0425     10/18/20 1000  bictegravir-emtricitabine-tenofovir AF (BIKTARVY) 50-200-25 MG per tablet 1 tablet  Status:  Discontinued        1 tablet Oral Daily 10/18/20 0610 11/07/20 1451   10/18/20 0415  metroNIDAZOLE (FLAGYL) IVPB 500 mg  Status:  Discontinued        500 mg 100 mL/hr over 60 Minutes Intravenous Every 8 hours 10/18/20 0404 10/19/20 1347   10/18/20 0415  ceFEPIme (MAXIPIME) 2 g in sodium chloride 0.9 %  100 mL IVPB        2 g 200 mL/hr over 30 Minutes Intravenous  Once 10/18/20 0414 10/18/20 0500   10/18/20 0345  vancomycin (VANCOREADY) IVPB 2000 mg/400 mL        2,000 mg 200 mL/hr over 120 Minutes Intravenous  Once 10/18/20 0335 10/18/20 [id number]        Subjective: Patient was seen and examined at bedside.  Overnight events noted. Patient is lying comfortably in the bed.  He reports pain is improved.  Wound VAC was removed 8/19.  Awaiting SNF placement. Overnight hemoglobin dropped to 6.8 requiring transfusion.  Objective: Vitals:   11/25/20 2011 11/26/20 0537 11/26/20 1034 11/26/20 1102  BP: 127/81 133/67 126/88 119/80  Pulse: 88 87 85 91  Resp: '20 18 16 15  '$ Temp: 97.9 F (36.6 C) 99.3 F (37.4 C) 98.4 F (36.9 C) 98.4 F (36.9 C)  TempSrc: Oral Oral Oral   SpO2: 100% 100% 100% 99%  Weight:      Height:        Intake/Output Summary (Last 24 hours) at 11/26/2020 1214 Last data filed at 11/26/2020 0900 Gross per 24 hour  Intake 480 ml  Output 550 ml  Net -70 ml   Filed Weights   11/20/20 0618 11/23/20 0618 11/23/20 2144  Weight: 117.3 kg 121.4 kg 121.4 kg    Examination:  General exam: Appears comfortable, not in any acute  distress. Respiratory system: Clear to auscultation bilaterally, no wheezing Cardiovascular system: S1-S2 heard, regular rate and rhythm, no murmur. Gastrointestinal system: Abdomen is soft, nontender, nondistended, bowel sounds normal.   Central nervous system: Alert and oriented x2. No focal neurological deficits. Extremities: Right BKA, left BKA, dressing seems clean. Skin: No rashes, lesions or ulcers Psychiatry: Mood and affect appropriate.    Data Reviewed: I have personally reviewed following labs and imaging studies  CBC: Recent Labs  Lab 11/22/20 0511 11/23/20 0239 11/24/20 0422 11/25/20 0734 11/25/20 1829 11/26/20 0422  WBC 10.2 9.7 8.8 10.8*  --  13.4*  NEUTROABS 7.3 7.3 6.1 8.2*  --  10.1*  HGB 8.4* 7.9* 8.4* 7.6* 7.2* 6.8*  HCT 26.0* 23.9* 25.5* 22.9* 22.4* 20.6*  MCV 91.2 90.5 90.4 90.2  --  90.7  PLT 423* 460* 480* 489*  --  0000000*   Basic Metabolic Panel: Recent Labs  Lab 11/22/20 0511 11/23/20 0239 11/24/20 0422 11/25/20 0734 11/26/20 0422  NA 135 135  133* 135 131* 132*  K 4.0 4.3  4.2 4.3 3.9 4.2  CL 104 100  103 102 101 101  CO2 '23 23  23 26 '$ 21* 22  GLUCOSE 112* 114*  121* 119* 126* 163*  BUN '16 17  18 16 '$ 23* 24*  CREATININE 1.79* 1.86*  1.75* 1.83* 1.97* 1.95*  CALCIUM 8.7* 9.2  8.7* 8.7* 8.3* 8.1*  MG 1.8 1.7 1.7 1.8 1.7  PHOS 3.4 3.5 3.7 3.7 3.0   GFR: Estimated Creatinine Clearance: 55.3 mL/min (A) (by C-G formula based on SCr of 1.95 mg/dL (H)). Liver Function Tests: Recent Labs  Lab 11/22/20 0511 11/23/20 0239 11/24/20 0422 11/25/20 0734 11/26/20 0422  ALBUMIN 2.2* 2.2* 2.2* 2.2* 2.1*   No results for input(s): LIPASE, AMYLASE in the last 168 hours. No results for input(s): AMMONIA in the last 168 hours. Coagulation Profile: No results for input(s): INR, PROTIME in the last 168 hours. Cardiac Enzymes: No results for input(s): CKTOTAL, CKMB, CKMBINDEX, TROPONINI in the last 168 hours. BNP (last 3 results) No results for  input(s): PROBNP in the last 8760 hours. HbA1C: No results for input(s): HGBA1C in the last 72 hours. CBG: Recent Labs  Lab 11/25/20 0738 11/25/20 1136 11/25/20 1615 11/25/20 2156 11/26/20 1103  GLUCAP 153* 124* 166* 136* 172*   Lipid Profile: No results for input(s): CHOL, HDL, LDLCALC, TRIG, CHOLHDL, LDLDIRECT in the last 72 hours. Thyroid Function Tests: No results for input(s): TSH, T4TOTAL, FREET4, T3FREE, THYROIDAB in the last 72 hours. Anemia Panel: No results for input(s): VITAMINB12, FOLATE, FERRITIN, TIBC, IRON, RETICCTPCT in the last 72 hours. Sepsis Labs: No results for input(s): PROCALCITON, LATICACIDVEN in the last 168 hours.  No results found for this or any previous visit (from the past 240 hour(s)).   Radiology Studies: No results found.  Scheduled Meds:  sodium chloride   Intravenous Once   allopurinol  100 mg Oral Daily   vitamin C  1,000 mg Oral Daily   aspirin EC  81 mg Oral Daily   atorvastatin  80 mg Oral Daily   dapsone  100 mg Oral Daily   docusate sodium  100 mg Oral Daily   dolutegravir  50 mg Oral Daily   doravirine  100 mg Oral Daily   empagliflozin  10 mg Oral Daily   escitalopram  20 mg Oral Daily   finasteride  5 mg Oral Daily   gabapentin  100 mg Oral TID   icosapent Ethyl  2 g Oral BID   insulin aspart  0-5 Units Subcutaneous QHS   lamiVUDine  300 mg Oral Daily   nutrition supplement (JUVEN)  1 packet Oral BID BM   oxybutynin  2.5 mg Oral BID   oxyCODONE  10 mg Oral QID   pantoprazole  40 mg Oral Daily   senna  2 tablet Oral QHS   zinc sulfate  220 mg Oral Daily   Continuous Infusions:  magnesium sulfate bolus IVPB       LOS: 39 days    Time spent: 25 mins    Shawna Clamp, MD [facility]   If 7PM-7AM, please contact night-coverage

## 2020-11-27 DIAGNOSIS — N179 Acute kidney failure, unspecified: Secondary | ICD-10-CM | POA: Diagnosis not present

## 2020-11-27 LAB — CBC WITH DIFFERENTIAL/PLATELET
Abs Immature Granulocytes: 0.11 10*3/uL — ABNORMAL HIGH (ref 0.00–0.07)
Basophils Absolute: 0.1 10*3/uL (ref 0.0–0.1)
Basophils Relative: 0 %
Eosinophils Absolute: 0.3 10*3/uL (ref 0.0–0.5)
Eosinophils Relative: 2 %
HCT: 22.7 % — ABNORMAL LOW (ref 39.0–52.0)
Hemoglobin: 7.4 g/dL — ABNORMAL LOW (ref 13.0–17.0)
Immature Granulocytes: 1 %
Lymphocytes Relative: 13 %
Lymphs Abs: 1.6 10*3/uL (ref 0.7–4.0)
MCH: 29.7 pg (ref 26.0–34.0)
MCHC: 32.6 g/dL (ref 30.0–36.0)
MCV: 91.2 fL (ref 80.0–100.0)
Monocytes Absolute: 1 10*3/uL (ref 0.1–1.0)
Monocytes Relative: 8 %
Neutro Abs: 9.2 10*3/uL — ABNORMAL HIGH (ref 1.7–7.7)
Neutrophils Relative %: 76 %
Platelets: 488 10*3/uL — ABNORMAL HIGH (ref 150–400)
RBC: 2.49 MIL/uL — ABNORMAL LOW (ref 4.22–5.81)
RDW: 13.8 % (ref 11.5–15.5)
WBC: 12.3 10*3/uL — ABNORMAL HIGH (ref 4.0–10.5)
nRBC: 0 % (ref 0.0–0.2)

## 2020-11-27 LAB — TYPE AND SCREEN
ABO/RH(D): O POS
Antibody Screen: NEGATIVE
Unit division: 0

## 2020-11-27 LAB — GLUCOSE, CAPILLARY
Glucose-Capillary: 114 mg/dL — ABNORMAL HIGH (ref 70–99)
Glucose-Capillary: 116 mg/dL — ABNORMAL HIGH (ref 70–99)
Glucose-Capillary: 116 mg/dL — ABNORMAL HIGH (ref 70–99)
Glucose-Capillary: 135 mg/dL — ABNORMAL HIGH (ref 70–99)

## 2020-11-27 LAB — BPAM RBC
Blood Product Expiration Date: 202209222359
ISSUE DATE / TIME: 202208211039
Unit Type and Rh: 5100

## 2020-11-27 LAB — RENAL FUNCTION PANEL
Albumin: 2.3 g/dL — ABNORMAL LOW (ref 3.5–5.0)
Anion gap: 9 (ref 5–15)
BUN: 25 mg/dL — ABNORMAL HIGH (ref 6–20)
CO2: 23 mmol/L (ref 22–32)
Calcium: 8.6 mg/dL — ABNORMAL LOW (ref 8.9–10.3)
Chloride: 100 mmol/L (ref 98–111)
Creatinine, Ser: 2.05 mg/dL — ABNORMAL HIGH (ref 0.61–1.24)
GFR, Estimated: 37 mL/min — ABNORMAL LOW (ref >60)
Glucose, Bld: 132 mg/dL — ABNORMAL HIGH (ref 70–99)
Phosphorus: 3.5 mg/dL (ref 2.5–4.6)
Potassium: 4.5 mmol/L (ref 3.5–5.1)
Sodium: 132 mmol/L — ABNORMAL LOW (ref 135–145)

## 2020-11-27 LAB — HEMOGLOBIN AND HEMATOCRIT, BLOOD
HCT: 23.4 % — ABNORMAL LOW (ref 39.0–52.0)
Hemoglobin: 7.7 g/dL — ABNORMAL LOW (ref 13.0–17.0)

## 2020-11-27 LAB — MAGNESIUM: Magnesium: 1.8 mg/dL (ref 1.7–2.4)

## 2020-11-27 MED ORDER — ALFUZOSIN HCL ER 10 MG PO TB24
10.0000 mg | ORAL_TABLET | Freq: Every day | ORAL | Status: DC
  Administered 2020-11-28 – 2020-12-27 (×29): 10 mg via ORAL
  Filled 2020-11-27 (×33): qty 1

## 2020-11-27 NOTE — Plan of Care (Signed)
Pt having difficulty voiding. Urinated small amt red urine with blood clot in it. Post void scan = 392. MD notified. Order received for I&O cath PRN. Pt does not want catheter and wants to try to void on his own more; ok per MD. Pt later woke up incontinent of urine then voided more in urinal, urine light red but no clots noted. PVR 272m and still does not want I&O cath.    Problem: Skin Integrity: Goal: Skin integrity will improve Outcome: Progressing   Problem: Pain Managment: Goal: General experience of comfort will improve Outcome: Progressing   Problem: Safety: Goal: Ability to remain free from injury will improve Outcome: Progressing

## 2020-11-27 NOTE — Progress Notes (Signed)
Patient is status post below-knee amputation revision on the left.  Last saw patient on Friday when he had significant bleeding and small dehiscence once again.  He also was having hematuria.  Heparin was stopped.  This morning nurses are at bedside.  He has significant dark bloody hematuria in his bed is covered with blood on the amputation site the dressing has not been reinforced since 5:00 last night and is dry currently.   Patient's H&H this morning is 7 and 22.  Was low as 6.8 yesterday.   Patient has coagulopathy affecting healing of below-knee amputation stump as well as gross hematuria.  Dressing is currently dry

## 2020-11-27 NOTE — Progress Notes (Signed)
PROGRESS NOTE    Keith [name]  [id number] DOB: 03-21-1966 DOA: 10/17/2020 [name], NP   Brief Narrative: This 55 year old male with past medical history of HIV, chronic kidney disease stage IIIb, type 2 diabetes mellitus, hypertension, hyperlipidemia, coronary artery disease, status post right below knee amputation who was sent to the ED by his orthopedic surgeon due to concern for left heel ulcer that had failed outpatient therapy.  Subsequently, MRI confirmed osteomyelitis.  He underwent left transtibial amputation by [name]. During the hospitalization, he had worsening renal function.  Nephrology was consulted.  He had temporary dialysis catheter placed and dialysis was started.  Later, he developed hematuria and required fulguration and CBI along with blood transfusions.  Renal function improved; dialysis was stopped and nephrology signed off. Subsequently, his wound dehisced with bleeding requiring blood transfusion.  He underwent left below-knee amputation revision by [name] on 11/17/2020.   Assessment & Plan:   Principal Problem:   AKI (acute kidney injury) ([place]) Active Problems:   Elevated blood pressure reading with diagnosis of hypertension   HIV disease ([place])   Uncontrolled type 2 diabetes mellitus with hyperglycemia (HCC)   Cellulitis   Ulcer of heel due to diabetes mellitus (HCC)   Normochromic normocytic anemia   Cellulitis of left foot   Acute osteomyelitis of left calcaneus (HCC)   Dehiscence of amputation stump (HCC)   Hx of BKA, left (HCC)  Acute kidney injury with metabolic acidosis: > Improved. Patient presented with worsening renal functions and started on dialysis during this admission but was then subsequently stopped and nephrology signed off.   AKI was felt to be due to ATN.  Tunneled dialysis catheter has already been removed. Renal ultrasound was negative for hydronephrosis. Status post Foley catheter placement on 11/08/2020 with clots:  Urology was consulted.   He underwent cystoscopy with fulguration on 11/09/2020 and started on CBI and Proscar.  Subsequently CBI was discontinued Creatinine 2.05 on 11/14/2020 and worsened to 3.15 on 11/15/2020.  Creatinine 2.05 today.  DC'd IV fluids on 11/20/2020. Foley catheter discontinued on 11/20/2020.  Hematuria has resolved 8/22> patient has hematuria,  passing clots.  Urology was reconsulted.   Anemia due to acute blood loss from bladder and left stump Hemoglobin dropped to 6.5 on 11/14/2020 for which patient was transfused 2 units packed red cells.   Hemoglobin again dropped to 6.0 on 11/19/2020; status post 1 more unit of packed red cell transfusion.   Hemoglobin 6.9 on 11/20/2020.  Status post 1 more unit packed red cell transfusion on 11/20/2020. Hb 8.1> 8.4 > 7.9 >8.4>7.6 >6.8, 1PRBC > Hb 7.4 Continue to moniter H&H.  Left heel osteomyelitis and abscess status post transtibial amputation on 10/27/2020 with subsequent dehiscence of wound with bleeding. Patient has had significant bleeding from the wound with wound dehiscence requiring blood transfusions. Orthopedics following: Status post revision of left below-knee amputation on 11/17/2020 by [name].   Wound VAC was removed on 8/18. Further management as per ortho. Patient in bleeding from his stump.  Orthopedics recommended pressure dressing and daily dressing.   Hyponatremia Replaced, Continue to monitor.   CAD status post stenting in 03/2020 Continue aspirin,  Brilinta discontinued for now since 11/19/2020 as per cardiology recommendations.   Once bleeding stabilizes, will resume Brilinta.  Continue Vascepa and Lipitor  Hyperkalemia Resolved.  HIV Continue current regimen of antiretrovirals and dapsone as per ID.  Outpatient follow-up with ID   Diabetes mellitus type 2 with hyperglycemia Continue CBGs  with SSI.  A1c 13.9 on 08/29/2020.  DVT prophylaxis: SCDs Code Status: Full code. Family Communication: None at bed  side. Disposition Plan:   Status is: Inpatient  Remains inpatient appropriate because:Inpatient level of care appropriate due to severity of illness  Dispo:  Patient From:   Home  Planned Disposition: [place]  Medically stable for discharge:       Consultants:  Nephrology, ID, Urology, Ortho  Procedures: Dialysis catheter placement and subsequent removal,  left transtibial amputation Revision of left below-knee amputation on 11/17/2020 by [name].  Antimicrobials:   Anti-infectives (From admission, onward)    Start     Dose/Rate Route Frequency Ordered Stop   11/18/20 0100  ceFAZolin (ANCEF) IVPB 2g/100 mL premix        2 g 200 mL/hr over 30 Minutes Intravenous Every 8 hours 11/17/20 2045 11/18/20 1038   11/17/20 0600  ceFAZolin (ANCEF) IVPB 3g/100 mL premix        3 g 200 mL/hr over 30 Minutes Intravenous On call to O.R. 11/16/20 1941 11/17/20 1901   11/11/20 1200  vancomycin (VANCOREADY) IVPB 500 mg/100 mL  Status:  Discontinued        500 mg 100 mL/hr over 60 Minutes Intravenous Every T-Th-Sa (Hemodialysis) 11/09/20 1414 11/11/20 1328   11/10/20 0000  Dolutegravir-lamiVUDine (DOVATO) 50-300 MG TABS        1 tablet Oral Daily 11/10/20 1048     11/10/20 0000  doravirine (PIFELTRO) 100 MG TABS tablet        100 mg Oral Daily 11/10/20 1048     11/08/20 1000  dolutegravir (TIVICAY) tablet 50 mg        50 mg Oral Daily 11/07/20 1451     11/08/20 1000  lamiVUDine (EPIVIR) tablet 300 mg        300 mg Oral Daily 11/07/20 1451     11/08/20 1000  doravirine (PIFELTRO) tablet 100 mg        100 mg Oral Daily 11/07/20 1451     11/02/20 1130  ceFAZolin (ANCEF) IVPB 2g/100 mL premix        2 g 200 mL/hr over 30 Minutes Intravenous To Radiology 11/02/20 1042 11/02/20 1704   10/27/20 1245  ceFAZolin (ANCEF) IVPB 3g/100 mL premix        3 g 200 mL/hr over 30 Minutes Intravenous On call to O.R. 10/27/20 0755 10/27/20 1608   10/20/20 1415  linezolid (ZYVOX) tablet 600  mg  Status:  Discontinued        600 mg Oral Every 12 hours 10/20/20 1326 10/28/20 1115   10/19/20 1445  metroNIDAZOLE (FLAGYL) tablet 500 mg  Status:  Discontinued        500 mg Oral Every 12 hours 10/19/20 1347 10/28/20 1115   10/18/20 1600  ceFEPIme (MAXIPIME) 2 g in sodium chloride 0.9 % 100 mL IVPB  Status:  Discontinued        2 g 200 mL/hr over 30 Minutes Intravenous Every 12 hours 10/18/20 0414 10/28/20 1112   10/18/20 1000  dapsone tablet 100 mg        100 mg Oral Daily 10/18/20 0425     10/18/20 1000  bictegravir-emtricitabine-tenofovir AF (BIKTARVY) 50-200-25 MG per tablet 1 tablet  Status:  Discontinued        1 tablet Oral Daily 10/18/20 0610 11/07/20 1451   10/18/20 0415  metroNIDAZOLE (FLAGYL) IVPB 500 mg  Status:  Discontinued        500 mg 100  mL/hr over 60 Minutes Intravenous Every 8 hours 10/18/20 0404 10/19/20 1347   10/18/20 0415  ceFEPIme (MAXIPIME) 2 g in sodium chloride 0.9 % 100 mL IVPB        2 g 200 mL/hr over 30 Minutes Intravenous  Once 10/18/20 0414 10/18/20 0500   10/18/20 0345  vancomycin (VANCOREADY) IVPB 2000 mg/400 mL        2,000 mg 200 mL/hr over 120 Minutes Intravenous  Once 10/18/20 0335 10/18/20 [id number]        Subjective: Patient was seen and examined at bedside.  Overnight events noted. Patient has developed hematuria, bleeding from AKA stump. Patient is lying comfortably in the bed.  He reports pain is improved.  Awaiting SNF placement.   Objective: Vitals:   11/26/20 1651 11/26/20 2033 11/27/20 0350 11/27/20 0854  BP: (!) 143/103 (!) 151/94 113/69 129/79  Pulse: 87 94 84 84  Resp: '18 16 18 18  '$ Temp: 98.5 F (36.9 C) 98.9 F (37.2 C) 99 F (37.2 C) 98.3 F (36.8 C)  TempSrc: Oral Oral Oral   SpO2: 97% 99% 94% 100%  Weight:      Height:        Intake/Output Summary (Last 24 hours) at 11/27/2020 1434 Last data filed at 11/27/2020 1300 Gross per 24 hour  Intake 640 ml  Output 875 ml  Net -235 ml   Filed Weights   11/20/20 0618  11/23/20 0618 11/23/20 2144  Weight: 117.3 kg 121.4 kg 121.4 kg    Examination:  General exam: Appears comfortable, not in any acute distress. Respiratory system: Clear to auscultation bilaterally, no wheezing Cardiovascular system: S1-S2 heard, regular rate and rhythm, no murmur. Gastrointestinal system: Abdomen is soft, nontender, nondistended, bowel sounds normal.   Central nervous system: Alert and oriented x 3. No focal neurological deficits. Extremities: Right BKA, left AKA, dressing seems clean Skin: No rashes, lesions or ulcers Psychiatry: Mood and affect appropriate.    Data Reviewed: I have personally reviewed following labs and imaging studies  CBC: Recent Labs  Lab 11/23/20 0239 11/24/20 0422 11/25/20 0734 11/25/20 1829 11/26/20 0422 11/27/20 0345  WBC 9.7 8.8 10.8*  --  13.4* 12.3*  NEUTROABS 7.3 6.1 8.2*  --  10.1* 9.2*  HGB 7.9* 8.4* 7.6* 7.2* 6.8* 7.4*  HCT 23.9* 25.5* 22.9* 22.4* 20.6* 22.7*  MCV 90.5 90.4 90.2  --  90.7 91.2  PLT 460* 480* 489*  --  476* [id number]*   Basic Metabolic Panel: Recent Labs  Lab 11/23/20 0239 11/24/20 0422 11/25/20 0734 11/26/20 0422 11/27/20 0345  NA 135  133* 135 131* 132* 132*  K 4.3  4.2 4.3 3.9 4.2 4.5  CL 100  103 102 101 101 100  CO2 '23  23 26 '$ 21* 22 23  GLUCOSE 114*  121* 119* 126* 163* 132*  BUN '17  18 16 '$ 23* 24* 25*  CREATININE 1.86*  1.75* 1.83* 1.97* 1.95* 2.05*  CALCIUM 9.2  8.7* 8.7* 8.3* 8.1* 8.6*  MG 1.7 1.7 1.8 1.7 1.8  PHOS 3.5 3.7 3.7 3.0 3.5   GFR: Estimated Creatinine Clearance: 52.6 mL/min (A) (by C-G formula based on SCr of 2.05 mg/dL (H)). Liver Function Tests: Recent Labs  Lab 11/23/20 0239 11/24/20 0422 11/25/20 0734 11/26/20 0422 11/27/20 0345  ALBUMIN 2.2* 2.2* 2.2* 2.1* 2.3*   No results for input(s): LIPASE, AMYLASE in the last 168 hours. No results for input(s): AMMONIA in the last 168 hours. Coagulation Profile: No results for input(s): INR, PROTIME in the  last 168  hours. Cardiac Enzymes: No results for input(s): CKTOTAL, CKMB, CKMBINDEX, TROPONINI in the last 168 hours. BNP (last 3 results) No results for input(s): PROBNP in the last 8760 hours. HbA1C: No results for input(s): HGBA1C in the last 72 hours. CBG: Recent Labs  Lab 11/26/20 1103 11/26/20 1650 11/26/20 2124 11/27/20 0644 11/27/20 1157  GLUCAP 172* 123* 161* 116* 114*   Lipid Profile: No results for input(s): CHOL, HDL, LDLCALC, TRIG, CHOLHDL, LDLDIRECT in the last 72 hours. Thyroid Function Tests: No results for input(s): TSH, T4TOTAL, FREET4, T3FREE, THYROIDAB in the last 72 hours. Anemia Panel: No results for input(s): VITAMINB12, FOLATE, FERRITIN, TIBC, IRON, RETICCTPCT in the last 72 hours. Sepsis Labs: No results for input(s): PROCALCITON, LATICACIDVEN in the last 168 hours.  No results found for this or any previous visit (from the past 240 hour(s)).   Radiology Studies: No results found.  Scheduled Meds:  sodium chloride   Intravenous Once   allopurinol  100 mg Oral Daily   vitamin C  1,000 mg Oral Daily   aspirin EC  81 mg Oral Daily   atorvastatin  80 mg Oral Daily   dapsone  100 mg Oral Daily   docusate sodium  100 mg Oral Daily   dolutegravir  50 mg Oral Daily   doravirine  100 mg Oral Daily   empagliflozin  10 mg Oral Daily   escitalopram  20 mg Oral Daily   finasteride  5 mg Oral Daily   gabapentin  100 mg Oral TID   icosapent Ethyl  2 g Oral BID   insulin aspart  0-5 Units Subcutaneous QHS   lamiVUDine  300 mg Oral Daily   nutrition supplement (JUVEN)  1 packet Oral BID BM   oxybutynin  2.5 mg Oral BID   oxyCODONE  10 mg Oral QID   pantoprazole  40 mg Oral Daily   senna  2 tablet Oral QHS   zinc sulfate  220 mg Oral Daily   Continuous Infusions:  magnesium sulfate bolus IVPB       LOS: 40 days    Time spent: 25 mins    Shawna Clamp, MD [facility]   If 7PM-7AM, please contact night-coverage

## 2020-11-27 NOTE — Consult Note (Addendum)
I have been asked to see the patient by Dr. Shawna Clamp, for evaluation and management of gross hematuria.  History of present illness: 55 year old man who initially developed gross hematuria following traumatic Foley catheter placement.  He was found to have BPH with huge hypervascular prostate and underwent clot evac (458ml), fulguration of prostate 8/4 with Dr Abner Greenspan. Hematuria resolved. Foley removed 8/15.  He recently underwent a left BKA revision on 11/17/2020.  Urology was reconsulted today for gross hematuria associated with acute blood loss anemia.  He is also having wound dehiscence from left BKA.  Hgb 7.4 today.  Patient states bladder scan from last night was 20 cc.   Review of systems: A 12 point comprehensive review of systems was obtained and is negative unless otherwise stated in the history of present illness.  Patient Active Problem List   Diagnosis Date Noted   Dehiscence of amputation stump ([place])    Hx of BKA, left ([place])    Cellulitis of left foot    Acute osteomyelitis of left calcaneus ([place])    Ulcer of heel due to diabetes mellitus ([place])    Normochromic normocytic anemia    Cellulitis 10/18/2020   Atypical chest pain 37/01/6268   Acute metabolic encephalopathy 48/54/6270   Hyperglycemia due to diabetes mellitus ([place]) 08/29/2020   Hyperglycemia 08/29/2020   Acute cerebrovascular accident (CVA) due to ischemia ([place]) 04/28/2020   Type 2 diabetes mellitus with hyperlipidemia ([place])    PAD (peripheral artery disease) ([place])    Hypoglycemia 04/25/2020   Acute ST elevation myocardial infarction (STEMI) due to occlusion of circumflex coronary artery ([place]) 03/22/2020   Uncontrolled type 2 diabetes mellitus with hyperglycemia ([place]) 01/14/2019   Elevated glucose 01/14/2019   Hx of BKA, right ([place]) 01/14/2019   Pressure injury of skin 11/11/2018   STEMI (ST elevation myocardial infarction) ([place]) 11/10/2018   CKD (chronic kidney disease) stage 4, GFR 15-29 ml/min ([place]) 11/10/2018    Amputation stump infection ([place]) 10/28/2018   Type II diabetes mellitus with renal manifestations ([place]) 08/07/2018   Uncontrolled type 2 diabetes mellitus with hyperosmolar nonketotic hyperglycemia ([place]) 08/07/2018   Non-pressure chronic ulcer of right calf limited to breakdown of skin ([place]) 07/06/2018   Type 2 diabetes mellitus without complication, without long-term current use of insulin ([place]) 06/15/2018   Chronic low back pain without sciatica 06/15/2018   Idiopathic chronic venous hypertension of left lower extremity with ulcer and inflammation (HCC)    Venous stasis ulcer of left calf limited to breakdown of skin without varicose veins ([place]) 04/23/2018   Acquired absence of right leg below knee ([place]) 06/13/2017   Acute kidney injury superimposed on CKD ([place]) 03/15/2017   Diabetic polyneuropathy associated with type 2 diabetes mellitus ([place]) 01/23/2017   AKI (acute kidney injury) ([place]) 09/28/2016   Nausea vomiting and diarrhea 09/28/2016   Onychomycosis of multiple toenails with type 2 diabetes mellitus ([place]) 08/29/2015   MRSA carrier 04/20/2014   Penile wart 02/22/2014   HIV disease ([place])    Insulin-requiring or dependent type II diabetes mellitus ([place]) 02/04/2014   Dental anomaly 11/20/2012   Arthritis of right knee 02/23/2012   Hyperlipidemia 11/10/2011   Hyponatremia 11/10/2011   Chronic pain 08/07/2011   Meralgia paraesthetica 04/23/2011   ERECTILE DYSFUNCTION 08/22/2008   Elevated blood pressure reading with diagnosis of hypertension 05/19/2008    No current facility-administered medications on file prior to encounter.   Current Outpatient Medications on File Prior to Encounter  Medication Sig Dispense Refill   acetaminophen (  TYLENOL) 325 MG tablet Take 2 tablets (650 mg total) by mouth every 4 (four) hours as needed for mild pain, fever or headache.     allopurinol (ZYLOPRIM) 300 MG tablet Take 300 mg by mouth daily.     amLODipine (NORVASC) 10 MG tablet Take 1 tablet  (10 mg total) by mouth daily. 30 tablet 1   aspirin 81 MG EC tablet Take 1 tablet (81 mg total) by mouth daily. 30 tablet 12   atorvastatin (LIPITOR) 80 MG tablet Take 1 tablet (80 mg total) by mouth daily. 30 tablet 1   bictegravir-emtricitabine-tenofovir AF (BIKTARVY) 50-200-25 MG TABS tablet Take 1 tablet by mouth daily. 90 tablet 3   blood glucose meter kit and supplies Dispense based on patient and insurance preference. Use up to four times daily as directed. (FOR ICD-10 E10.9, E11.9). 1 each 0   carvedilol (COREG) 25 MG tablet Take 1 tablet (25 mg total) by mouth 2 (two) times daily with a meal. 60 tablet 1   dapsone 100 MG tablet TAKE 1 TABLET (100 MG TOTAL) BY MOUTH DAILY. (Patient taking differently: Take 100 mg by mouth daily.) 30 tablet 0   escitalopram (LEXAPRO) 20 MG tablet Take 20 mg by mouth daily.     gabapentin (NEURONTIN) 100 MG capsule Take 1 capsule (100 mg total) by mouth 3 (three) times daily. 90 capsule 1   glucose blood (COOL BLOOD GLUCOSE TEST STRIPS) test strip Use as instructed 100 each 0   icosapent Ethyl (VASCEPA) 1 g capsule TAKE 2 CAPSULES (2 G TOTAL) BY MOUTH TWO TIMES DAILY. (Patient taking differently: Take 2 g by mouth 2 (two) times daily.) 120 capsule 3   insulin aspart protamine- aspart (NOVOLOG MIX 70/30) (70-30) 100 UNIT/ML injection Inject 0.3 mLs (30 Units total) into the skin 2 (two) times daily with a meal. With breakfast and dinner. (Patient taking differently: No sig reported) 18 mL 2   lisinopril (ZESTRIL) 5 MG tablet Take 5 mg by mouth daily.     metFORMIN (GLUCOPHAGE) 1000 MG tablet Take 1,000 mg by mouth 2 (two) times daily.     nitroGLYCERIN (NITROSTAT) 0.4 MG SL tablet PLACE 1 TABLET (0.4 MG TOTAL) UNDER THE TONGUE EVERY FIVE MINUTES X 3 DOSES AS NEEDED FOR CHEST PAIN. (Patient taking differently: Place 0.4 mg under the tongue every 5 (five) minutes x 3 doses as needed for chest pain.) 25 tablet 12   oxybutynin (DITROPAN) 5 MG tablet TAKE 1 TABLET (5  MG TOTAL) BY MOUTH TWO TIMES DAILY. (Patient taking differently: Take 5 mg by mouth 2 (two) times daily.) 60 tablet 2   ticagrelor (BRILINTA) 90 MG TABS tablet Take 90 mg by mouth 2 (two) times daily.     traZODone (DESYREL) 100 MG tablet Take 100 mg by mouth at bedtime.     Lancets (ONETOUCH ULTRASOFT) lancets Use as instructed 100 each 12    Past Medical History:  Diagnosis Date   Abscess of right foot    abscess/ulcer of R transtibial amputation requiring IV abx   AIDS ([place])    Anemia    CAD (coronary artery disease)    a. MI with stenting of OM1 in 11/2018 with residual disease. b. acute STEMI 03/2020 s/p DES to OM1   Chronic anemia    Chronic knee pain    right   Chronic pain    CKD (chronic kidney disease), stage IV (HCC)    CVA (cerebral vascular accident) ([place]) 04/2020   Diabetes type 2,  uncontrolled ([place])    HgA1c 17.6 (04/27/2010)   Diabetic foot ulcer ([place]) 01/2017   right foot   Dilatation of aorta (HCC)    Erectile dysfunction    Genital warts    HIV (human immunodeficiency virus infection) ([place]) 2009   CD4 count 100, VL 13800 (05/01/2010)   Hyperlipidemia    Hypertension    Noncompliance with medication regimen    Osteomyelitis ([place])    h/o hand   Osteomyelitis of metatarsal ([place]) 04/28/2017    Past Surgical History:  Procedure Laterality Date   AMPUTATION Right 05/02/2017   Procedure: AMPUTATION TRANSMETARSAL;  Surgeon: [name], MD;  Location: [facility];  Service: Orthopedics;  Laterality: Right;   AMPUTATION Right 06/13/2017   Procedure: RIGHT BELOW KNEE AMPUTATION;  Surgeon: [name], MD;  Location: [facility];  Service: Orthopedics;  Laterality: Right;   AMPUTATION Left 10/27/2020   Procedure: LEFT BELOW KNEE AMPUTATION;  Surgeon: [name], MD;  Location: [facility];  Service: Orthopedics;  Laterality: Left;   AMPUTATION Left 11/17/2020   Procedure: REVISION AMPUTATION BELOW KNEE, LEFT;  Surgeon: [name], MD;  Location: [facility];  Service:  Orthopedics;  Laterality: Left;   APPLICATION OF WOUND VAC Left 10/27/2020   Procedure: APPLICATION OF WOUND VAC;  Surgeon: [name], MD;  Location: [facility];  Service: Orthopedics;  Laterality: Left;   BELOW KNEE LEG AMPUTATION Right 06/13/2017   BELOW KNEE LEG AMPUTATION     CORONARY STENT INTERVENTION N/A 11/10/2018   Procedure: CORONARY STENT INTERVENTION;  Surgeon: Leonie Man, MD;  Location: [facility];  Service: Cardiovascular;  Laterality: N/A;   CORONARY/GRAFT ACUTE MI REVASCULARIZATION N/A 03/21/2020   Procedure: Coronary/Graft Acute MI Revascularization;  Surgeon: [name], MD;  Location: [facility];  Service: Cardiovascular;  Laterality: N/A;   CYSTOSCOPY N/A 11/09/2020   Procedure: CYSTOSCOPY, EVACUATION OF CLOTS, FULGERATION OF BLADDER AND BILATERIAL RETROGRADE PYELOGRAMS;  Surgeon: Janith Lima, MD;  Location: [facility];  Service: Urology;  Laterality: N/A;   HERNIA REPAIR     I & D EXTREMITY Left 08/21/2014   Procedure: INCISION AND DRAINAGE LEFT SMALL FINGER;  Surgeon: Leanora Cover, MD;  Location: [facility];  Service: Orthopedics;  Laterality: Left;   I & D EXTREMITY Right 03/18/2017   Procedure: IRRIGATION AND DEBRIDEMENT EXTREMITY;  Surgeon: [name], MD;  Location: [facility];  Service: Orthopedics;  Laterality: Right;   IR FLUORO GUIDE CV LINE RIGHT  11/02/2020   IR REMOVAL TUN CV CATH W/O FL  11/13/2020   IR US GUIDE VASC ACCESS RIGHT  11/02/2020   LEFT HEART CATH AND CORONARY ANGIOGRAPHY N/A 11/10/2018   Procedure: LEFT HEART CATH AND CORONARY ANGIOGRAPHY;  Surgeon: Leonie Man, MD;  Location: [facility];  Service: Cardiovascular;  Laterality: N/A;   LEFT HEART CATH AND CORONARY ANGIOGRAPHY N/A 03/21/2020   Procedure: LEFT HEART CATH AND CORONARY ANGIOGRAPHY;  Surgeon: [name], MD;  Location: [facility];  Service: Cardiovascular;  Laterality: N/A;   MINOR IRRIGATION AND DEBRIDEMENT OF WOUND Right 04/22/2014   Procedure:  IRRIGATION AND DEBRIDEMENT OF RIGHT NECK ABCESS;  Surgeon: Jerrell Belfast, MD;  Location: [facility];  Service: ENT;  Laterality: Right;   MULTIPLE EXTRACTIONS WITH ALVEOLOPLASTY N/A 01/18/2013   Procedure: MULTIPLE EXTRACION 3, 6, 7, 10, 11, 13, 21, 22, 27, 28, 29, 30 WITH ALVEOLOPLASTY;  Surgeon: Gae Bon, DDS;  Location: [facility];  Service: Oral Surgery;  Laterality: N/A;   SKIN SPLIT GRAFT Right 03/21/2017   Procedure: IRRIGATION AND DEBRIDEMENT RIGHT FOOT AND APPLY SPLIT THICKNESS SKIN GRAFT AND WOUND VAC;  Surgeon: [name], MD;  Location: [facility];  Service: Orthopedics;  Laterality: Right;   TEE WITHOUT CARDIOVERSION N/A 05/02/2017   Procedure: TRANSESOPHAGEAL ECHOCARDIOGRAM (TEE);  Surgeon: Thayer Headings, MD;  [facility];  Service: Cardiovascular;  Laterality: N/A;  coincidental to orthopedic case    Social History   Tobacco Use   Smoking status: Never   Smokeless tobacco: Never  Vaping Use   Vaping Use: Never used  Substance Use Topics   Alcohol use: Not Currently   Drug use: Not Currently    Comment: OTC ASA    Family History  Problem Relation Age of Onset   Hypertension Mother    Arthritis Father    Hypertension Father    Hypertension Brother    Cancer Maternal Grandmother 72       unknown type of cancer   Depression Paternal Grandmother     PE: Vitals:   11/26/20 1651 11/26/20 2033 11/27/20 0350 11/27/20 0854  BP: (!) 143/103 (!) 151/94 113/69 129/79  Pulse: 87 94 84 84  Resp: $Remo'18 16 18 18  'gUkxw$ Temp: 98.5 F (36.9 C) 98.9 F (37.2 C) 99 F (37.2 C) 98.3 F (36.8 C)  TempSrc: Oral Oral Oral   SpO2: 97% 99% 94% 100%  Weight:      Height:       Patient appears to be in no acute distress  patient is alert and oriented x3 Atraumatic normocephalic head No increased work of breathing, no audible wheezes/rhonchi Regular sinus rhythm/rate Abdomen is soft, nontender, nondistended GU: urine in bedside urinal tea colored, thin, no significant clots Left  BKA dressing c/d/I Grossly neurologically intact No identifiable skin lesions  Recent Labs    11/25/20 0734 11/25/20 1829 11/26/20 0422 11/27/20 0345  WBC 10.8*  --  13.4* 12.3*  HGB 7.6* 7.2* 6.8* 7.4*  HCT 22.9* 22.4* 20.6* 22.7*   Recent Labs    11/25/20 0734 11/26/20 0422 11/27/20 0345  NA 131* 132* 132*  K 3.9 4.2 4.5  CL 101 101 100  CO2 21* 22 23  GLUCOSE 126* 163* 132*  BUN 23* 24* 25*  CREATININE 1.97* 1.95* 2.05*  CALCIUM 8.3* 8.1* 8.6*   No results for input(s): LABPT, INR in the last 72 hours. No results for input(s): LABURIN in the last 72 hours. Results for orders placed or performed during the hospital encounter of 10/17/20  Blood culture (routine x 2)     Status: None   Collection Time: 10/17/20  6:30 PM   Specimen: BLOOD LEFT HAND  Result Value Ref Range Status   Specimen Description BLOOD LEFT HAND  Final   Special Requests   Final    BOTTLES DRAWN AEROBIC AND ANAEROBIC Blood Culture results may not be optimal due to an inadequate volume of blood received in culture bottles   Culture   Final    NO GROWTH 5 DAYS Performed at [facility], [place] [address] [place], [place] [address]    Report Status 10/22/2020 FINAL  Final  Blood culture (routine x 2)     Status: None   Collection Time: 10/18/20  4:39 AM   Specimen: BLOOD RIGHT WRIST  Result Value Ref Range Status   Specimen Description BLOOD RIGHT WRIST  Final   Special Requests   Final  BOTTLES DRAWN AEROBIC AND ANAEROBIC Blood Culture results may not be optimal due to an inadequate volume of blood received in culture bottles   Culture   Final    NO GROWTH 5 DAYS Performed at [facility], [place] [address] [place], [place] [address]    Report Status 10/23/2020 FINAL  Final  SARS CORONAVIRUS 2 (TAT 6-24 HRS) Nasopharyngeal Nasopharyngeal Swab     Status: None   Collection Time: 10/18/20  5:23 AM   Specimen: Nasopharyngeal Swab  Result Value Ref Range Status   SARS Coronavirus 2  NEGATIVE NEGATIVE Final    Comment: (NOTE) SARS-CoV-2 target nucleic acids are NOT DETECTED.  The SARS-CoV-2 RNA is generally detectable in upper and lower respiratory specimens during the acute phase of infection. Negative results do not preclude SARS-CoV-2 infection, do not rule out co-infections with other pathogens, and should not be used as the sole basis for treatment or other patient management decisions. Negative results must be combined with clinical observations, patient history, and epidemiological information. The expected result is Negative.  Fact Sheet for Patients: SugarRoll.be  Fact Sheet for Healthcare Providers: https://www.woods-mathews.com/  This test is not yet approved or cleared by the [place] FDA and  has been authorized for detection and/or diagnosis of SARS-CoV-2 by FDA under an Emergency Use Authorization (EUA). This EUA will remain  in effect (meaning this test can be used) for the duration of the COVID-19 declaration under Se ction 564(b)(1) of the Act, 21 U.S.C. section 360bbb-3(b)(1), unless the authorization is terminated or revoked sooner.  Performed at [facility], [place] [address], [place], [place] [address]   MRSA Next Gen by PCR, Nasal     Status: None   Collection Time: 10/18/20  6:16 AM   Specimen: Nasal Mucosa; Nasal Swab  Result Value Ref Range Status   MRSA by PCR Next Gen NOT DETECTED NOT DETECTED Final    Comment: (NOTE) The GeneXpert MRSA Assay (FDA approved for NASAL specimens only), is one component of a comprehensive MRSA colonization surveillance program. It is not intended to diagnose MRSA infection nor to guide or monitor treatment for MRSA infections. Test performance is not FDA approved in patients less than 59 years old. Performed at [facility], [place] [address] [place], [place] [address]   Surgical pcr screen     Status: None   Collection Time: 10/27/20  9:52  AM   Specimen: Nasal Mucosa; Nasal Swab  Result Value Ref Range Status   MRSA, PCR NEGATIVE NEGATIVE Final   Staphylococcus aureus NEGATIVE NEGATIVE Final    Comment: (NOTE) The Xpert SA Assay (FDA approved for NASAL specimens in patients 62 years of age and older), is one component of a comprehensive surveillance program. It is not intended to diagnose infection nor to guide or monitor treatment. Performed at [facility], [place] [address] [place], [place] [address]     Imaging: CT 11/10/20 IMPRESSION: Normal noncontrast appearance of the kidneys without radiodense calculi or hydronephrosis.   If continued clinical concern for hematuria source, consider CT urogram for further evaluation.     Electronically Signed   By: Michaelle Birks MD   On: 11/10/2020 11:19  Imp/Recommendations: Johney Maine hematuria:  -Urine appears thin and tea colored consistent with mild hematuria and/or dehydration -Continue hydration -Continue finasteride, add uroxatrol to help maximal emptying -If concern for worsening hematuria or clots, recommend renal bladder US  Thank you for involving me in this patient's care.  Please page with any further questions or concerns. Riccardo Holeman D Trapper Meech

## 2020-11-27 NOTE — Progress Notes (Signed)
Physical Therapy Treatment Patient Details Name: Keith [name] MRN: [id number] DOB: 1965/04/24 Today's Date: 11/27/2020    History of Present Illness 55 y.o. male  who is sent to the ED by his orthopedic surgery provider due to concern for left heel ulcer that had failed outpatient therapy. with medical history significant for HIV, CKD 3B, type 2 diabetes, hypertension, hyperlipidemia, coronary artery disease, status post right below the knee amputation, now s/p L BKA 10/27/20. s/p REVISION L AMPUTATION BELOW KNEE on 8/12.    PT Comments    PT received permission from nursing to get to work with pt, who was in bed wet and bloody from his hematuria.  Pt is assisted to clean up and change gown, transfer to the chair with min assist and do there ex to BLE's with LLE elevated.  Pt is motivated and participating well, but due to fatigue did not want to don the prosthesis.  Nursing received report of pt being up and will follow up with him next session to don RLE prosthesis and resume work on standing.     Follow Up Recommendations  SNF     Equipment Recommendations       Recommendations for Other Services OT consult     Precautions / Restrictions Precautions Precautions: Fall Precaution Comments: B BKA's, RLE prosthesis Required Braces or Orthoses: Other Brace Other Brace: limb protector on left Restrictions Weight Bearing Restrictions: Yes RLE Weight Bearing: Weight bearing as tolerated LLE Weight Bearing: Non weight bearing    Mobility  Bed Mobility Overal bed mobility: Needs Assistance Bed Mobility: Supine to Sit Rolling: Min guard   Supine to sit: Min assist     General bed mobility comments: assisted to recliner but pt is movng well on the bed    Transfers Overall transfer level: Needs assistance   Transfers: Lateral/Scoot Transfers          Lateral/Scoot Transfers: Min guard;Min assist General transfer comment: pt is ready to do the transfer  Ambulation/Gait                  Stairs             Wheelchair Mobility    Modified Rankin (Stroke Patients Only)       Balance   Sitting-balance support: Bilateral upper extremity supported;Feet unsupported Sitting balance-Leahy Scale: Good                                      Cognition Arousal/Alertness: Awake/alert Behavior During Therapy: Flat affect;WFL for tasks assessed/performed Overall Cognitive Status: No family/caregiver present to determine baseline cognitive functioning Area of Impairment: Safety/judgement;Following commands                   Current Attention Level: Selective   Following Commands: Follows one step commands inconsistently;Follows one step commands with increased time Safety/Judgement: Decreased awareness of safety;Decreased awareness of deficits   Problem Solving: Slow processing;Requires verbal cues General Comments: minimally talking but is interactive, relatively unaware of his bed being wet with urine and blood      Exercises General Exercises - Lower Extremity Quad Sets: AROM;10 reps Gluteal Sets: AROM;10 reps Hip ABduction/ADduction: AAROM;10 reps    General Comments General comments (skin integrity, edema, etc.): pt was up to get OOB and helped PT get him cleaned up and into chair      Pertinent Vitals/Pain Pain Assessment: Faces Faces Pain  Scale: Hurts a little bit Pain Location: L residual limb Pain Descriptors / Indicators: Grimacing Pain Intervention(s): Limited activity within patient's tolerance;Monitored during session;Premedicated before session;Repositioned    Home Living                      Prior Function            PT Goals (current goals can now be found in the care plan section) Acute Rehab PT Goals Patient Stated Goal: to get better Progress towards PT goals: Progressing toward goals    Frequency    Min 3X/week      PT Plan Current plan remains appropriate     Co-evaluation              AM-PAC PT "6 Clicks" Mobility   Outcome Measure  Help needed turning from your back to your side while in a flat bed without using bedrails?: None Help needed moving from lying on your back to sitting on the side of a flat bed without using bedrails?: A Little Help needed moving to and from a bed to a chair (including a wheelchair)?: A Little Help needed standing up from a chair using your arms (e.g., wheelchair or bedside chair)?: Total Help needed to walk in hospital room?: Total Help needed climbing 3-5 steps with a railing? : Total 6 Click Score: 13    End of Session Equipment Utilized During Treatment: Gait belt Activity Tolerance: Patient tolerated treatment well Patient left: with call bell/phone within reach;in chair;with chair alarm set Nurse Communication: Mobility status PT Visit Diagnosis: Unsteadiness on feet (R26.81);Other abnormalities of gait and mobility (R26.89);Pain;Muscle weakness (generalized) (M62.81) Pain - Right/Left: Left Pain - part of body: Leg     Time: [id number] PT Time Calculation (min) (ACUTE ONLY): 34 min  Charges:  $Therapeutic Exercise: 8-22 mins $Therapeutic Activity: 8-22 mins                 Ramond Dial 11/27/2020, 8:56 PM   Mee Hives, PT MS Acute Rehab Dept. Number: [place] [facility] [place]

## 2020-11-27 NOTE — Plan of Care (Signed)
  Problem: Elimination: Goal: Will not experience complications related to urinary retention Outcome: Progressing   Problem: Safety: Goal: Ability to remain free from injury will improve Outcome: Progressing   

## 2020-11-28 DIAGNOSIS — N179 Acute kidney failure, unspecified: Secondary | ICD-10-CM | POA: Diagnosis not present

## 2020-11-28 LAB — CBC WITH DIFFERENTIAL/PLATELET
Abs Immature Granulocytes: 0.07 10*3/uL (ref 0.00–0.07)
Basophils Absolute: 0 10*3/uL (ref 0.0–0.1)
Basophils Relative: 0 %
Eosinophils Absolute: 0.4 10*3/uL (ref 0.0–0.5)
Eosinophils Relative: 4 %
HCT: 22.7 % — ABNORMAL LOW (ref 39.0–52.0)
Hemoglobin: 7.5 g/dL — ABNORMAL LOW (ref 13.0–17.0)
Immature Granulocytes: 1 %
Lymphocytes Relative: 14 %
Lymphs Abs: 1.3 10*3/uL (ref 0.7–4.0)
MCH: 29.8 pg (ref 26.0–34.0)
MCHC: 33 g/dL (ref 30.0–36.0)
MCV: 90.1 fL (ref 80.0–100.0)
Monocytes Absolute: 0.7 10*3/uL (ref 0.1–1.0)
Monocytes Relative: 7 %
Neutro Abs: 6.8 10*3/uL (ref 1.7–7.7)
Neutrophils Relative %: 74 %
Platelets: 532 10*3/uL — ABNORMAL HIGH (ref 150–400)
RBC: 2.52 MIL/uL — ABNORMAL LOW (ref 4.22–5.81)
RDW: 13.8 % (ref 11.5–15.5)
WBC: 9.3 10*3/uL (ref 4.0–10.5)
nRBC: 0 % (ref 0.0–0.2)

## 2020-11-28 LAB — RENAL FUNCTION PANEL
Albumin: 2.3 g/dL — ABNORMAL LOW (ref 3.5–5.0)
Anion gap: 8 (ref 5–15)
BUN: 27 mg/dL — ABNORMAL HIGH (ref 6–20)
CO2: 24 mmol/L (ref 22–32)
Calcium: 8.6 mg/dL — ABNORMAL LOW (ref 8.9–10.3)
Chloride: 102 mmol/L (ref 98–111)
Creatinine, Ser: 2.07 mg/dL — ABNORMAL HIGH (ref 0.61–1.24)
GFR, Estimated: 37 mL/min — ABNORMAL LOW (ref >60)
Glucose, Bld: 140 mg/dL — ABNORMAL HIGH (ref 70–99)
Phosphorus: 3.9 mg/dL (ref 2.5–4.6)
Potassium: 4.7 mmol/L (ref 3.5–5.1)
Sodium: 134 mmol/L — ABNORMAL LOW (ref 135–145)

## 2020-11-28 LAB — GLUCOSE, CAPILLARY
Glucose-Capillary: 111 mg/dL — ABNORMAL HIGH (ref 70–99)
Glucose-Capillary: 149 mg/dL — ABNORMAL HIGH (ref 70–99)
Glucose-Capillary: 138 mg/dL — ABNORMAL HIGH (ref 70–99)
Glucose-Capillary: 156 mg/dL — ABNORMAL HIGH (ref 70–99)

## 2020-11-28 NOTE — Progress Notes (Signed)
PROGRESS NOTE    Keith [name]  [id number] DOB: [birth year] DOA: 10/17/2020 [name], NP   Brief Narrative: This 55 year old male with past medical history of HIV, chronic kidney disease stage IIIb, type 2 diabetes mellitus, hypertension, hyperlipidemia, coronary artery disease, status post right below knee amputation who was sent to the ED by his orthopedic surgeon due to concern for left heel ulcer that had failed outpatient therapy.  Subsequently, MRI confirmed osteomyelitis.  He underwent left transtibial amputation by [name]. During the hospitalization, he had worsening renal function.  Nephrology was consulted.  He had temporary dialysis catheter placed and dialysis was started.  Later, he developed hematuria and required fulguration and CBI along with blood transfusions.  Renal function improved; dialysis was stopped and nephrology signed off. Subsequently, his wound dehisced with bleeding requiring blood transfusion.  He underwent left below-knee amputation revision by [name] on 11/17/2020.   Assessment & Plan:   Principal Problem:   AKI (acute kidney injury) ([place]) Active Problems:   Elevated blood pressure reading with diagnosis of hypertension   HIV disease ([place])   Uncontrolled type 2 diabetes mellitus with hyperglycemia (HCC)   Cellulitis   Ulcer of heel due to diabetes mellitus (HCC)   Normochromic normocytic anemia   Cellulitis of left foot   Acute osteomyelitis of left calcaneus (HCC)   Dehiscence of amputation stump (HCC)   Hx of BKA, left (HCC)  Acute kidney injury with metabolic acidosis: > Improved. Patient presented with worsening renal functions and started on dialysis during this admission but was then subsequently stopped and nephrology signed off.  AKI was felt to be due to ATN.  Tunneled dialysis catheter has already been removed. Renal ultrasound was negative for hydronephrosis.  Hematuria:  Status post Foley catheter placement on 11/08/2020  with clots: Urology was consulted.   He underwent cystoscopy with fulguration on 11/09/2020 and started on CBI and Proscar.  Subsequently CBI was discontinued Creatinine 2.05 on 11/14/2020 and worsened to 3.15 on 11/15/2020.  Creatinine 2.05 today.  DC'd IV fluids on 11/20/2020. Foley catheter discontinued on 11/20/2020.  hematuria improved. 8/22> patient has hematuria,  passing clots.  Urology recommended Proscar, Uroxatrol and IV hydration. Hematuria is improving   Anemia due to acute blood loss from bladder and left stump Patient has intermittent drop in hemoglobin, so far has received 5 units of PRBCs Hb 8.1> 8.4 > 7.9 >8.4>7.6 >6.8, 1PRBC > Hb 7.4 Continue to moniter H&H.  Left heel osteomyelitis: S/p transtibial amputation on 10/27/20 with subsequent dehiscence of wound with bleeding. Patient has had significant bleeding from the wound with wound dehiscence requiring blood transfusions. Orthopedics following: Status post revision of left below-knee amputation on 11/17/2020 by [name].   Wound VAC was removed on 8/18. Further management as per ortho. Patient is bleeding from his stump.  Orthopedics recommended pressure dressing and daily dressing.   Hyponatremia Replaced, Continue to monitor.   CAD status post stenting in 03/2020 Continue aspirin,  Brilinta discontinued for now since 11/19/2020 as per cardiology recommendations.   Once bleeding stabilizes, will resume Brilinta.  Continue Vascepa and Lipitor  Hyperkalemia Resolved.  HIV Continue current regimen of antiretrovirals and dapsone as per ID.   Outpatient follow-up with ID   Diabetes mellitus type 2 with hyperglycemia Continue CBGs with SSI.  A1c 13.9 on 08/29/2020.  DVT prophylaxis: SCDs Code Status: Full code. Family Communication: None at bed side. Disposition Plan:   Status is: Inpatient  Remains inpatient appropriate because:Inpatient  level of care appropriate due to severity of illness  Dispo:  Patient From:    Home  Planned Disposition: [place]  Medically stable for discharge:       Consultants:  Nephrology, ID, Urology, Ortho  Procedures: Dialysis catheter placement and subsequent removal,  left transtibial amputation Revision of left below-knee amputation on 11/17/2020 by [name].  Antimicrobials:   Anti-infectives (From admission, onward)    Start     Dose/Rate Route Frequency Ordered Stop   11/18/20 0100  ceFAZolin (ANCEF) IVPB 2g/100 mL premix        2 g 200 mL/hr over 30 Minutes Intravenous Every 8 hours 11/17/20 2045 11/18/20 1038   11/17/20 0600  ceFAZolin (ANCEF) IVPB 3g/100 mL premix        3 g 200 mL/hr over 30 Minutes Intravenous On call to O.R. 11/16/20 1941 11/17/20 1901   11/11/20 1200  vancomycin (VANCOREADY) IVPB 500 mg/100 mL  Status:  Discontinued        500 mg 100 mL/hr over 60 Minutes Intravenous Every T-Th-Sa (Hemodialysis) 11/09/20 1414 11/11/20 1328   11/10/20 0000  Dolutegravir-lamiVUDine (DOVATO) 50-300 MG TABS        1 tablet Oral Daily 11/10/20 1048     11/10/20 0000  doravirine (PIFELTRO) 100 MG TABS tablet        100 mg Oral Daily 11/10/20 1048     11/08/20 1000  dolutegravir (TIVICAY) tablet 50 mg        50 mg Oral Daily 11/07/20 1451     11/08/20 1000  lamiVUDine (EPIVIR) tablet 300 mg        300 mg Oral Daily 11/07/20 1451     11/08/20 1000  doravirine (PIFELTRO) tablet 100 mg        100 mg Oral Daily 11/07/20 1451     11/02/20 1130  ceFAZolin (ANCEF) IVPB 2g/100 mL premix        2 g 200 mL/hr over 30 Minutes Intravenous To Radiology 11/02/20 1042 11/02/20 1704   10/27/20 1245  ceFAZolin (ANCEF) IVPB 3g/100 mL premix        3 g 200 mL/hr over 30 Minutes Intravenous On call to O.R. 10/27/20 0755 10/27/20 1608   10/20/20 1415  linezolid (ZYVOX) tablet 600 mg  Status:  Discontinued        600 mg Oral Every 12 hours 10/20/20 1326 10/28/20 1115   10/19/20 1445  metroNIDAZOLE (FLAGYL) tablet 500 mg  Status:  Discontinued        500 mg  Oral Every 12 hours 10/19/20 1347 10/28/20 1115   10/18/20 1600  ceFEPIme (MAXIPIME) 2 g in sodium chloride 0.9 % 100 mL IVPB  Status:  Discontinued        2 g 200 mL/hr over 30 Minutes Intravenous Every 12 hours 10/18/20 0414 10/28/20 1112   10/18/20 1000  dapsone tablet 100 mg        100 mg Oral Daily 10/18/20 0425     10/18/20 1000  bictegravir-emtricitabine-tenofovir AF (BIKTARVY) 50-200-25 MG per tablet 1 tablet  Status:  Discontinued        1 tablet Oral Daily 10/18/20 0610 11/07/20 1451   10/18/20 0415  metroNIDAZOLE (FLAGYL) IVPB 500 mg  Status:  Discontinued        500 mg 100 mL/hr over 60 Minutes Intravenous Every 8 hours 10/18/20 0404 10/19/20 1347   10/18/20 0415  ceFEPIme (MAXIPIME) 2 g in sodium chloride 0.9 % 100 mL IVPB  2 g 200 mL/hr over 30 Minutes Intravenous  Once 10/18/20 0414 10/18/20 0500   10/18/20 0345  vancomycin (VANCOREADY) IVPB 2000 mg/400 mL        2,000 mg 200 mL/hr over 120 Minutes Intravenous  Once 10/18/20 0335 10/18/20 [id number]        Subjective: Patient was seen and examined at bedside.  Overnight events noted. Patient reports hematuria is improving but he continues to bleed from stump. Patient is lying comfortably in the bed.  He reports pain is improved.  Awaiting SNF placement.   Objective: Vitals:   11/27/20 0854 11/27/20 1801 11/28/20 0650 11/28/20 0918  BP: 129/79 128/74 (!) 166/98 (!) 129/93  Pulse: 84 74 70 95  Resp: '18 17 18 18  '$ Temp: 98.3 F (36.8 C) 98.4 F (36.9 C) 98.1 F (36.7 C) 98 F (36.7 C)  TempSrc:   Oral Oral  SpO2: 100% 100% 99%   Weight:      Height:        Intake/Output Summary (Last 24 hours) at 11/28/2020 1458 Last data filed at 11/28/2020 1300 Gross per 24 hour  Intake 720 ml  Output 320 ml  Net 400 ml   Filed Weights   11/20/20 0618 11/23/20 0618 11/23/20 2144  Weight: 117.3 kg 121.4 kg 121.4 kg    Examination:  General exam: Appears comfortable, sitting in the chair, not in any  distress Respiratory system: Clear to auscultation bilaterally, no wheezing Cardiovascular system: S1-S2 heard, regular rate and rhythm, no murmur. Gastrointestinal system: Abdomen is soft, nontender, nondistended, bowel sounds normal.   Central nervous system: Alert and oriented x 3. No focal neurological deficits. Extremities: Right BKA, left AKA, wound covered with staples with deep wound on edge Skin: No rashes, lesions or ulcers Psychiatry: Mood and affect appropriate.    Data Reviewed: I have personally reviewed following labs and imaging studies  CBC: Recent Labs  Lab 11/23/20 0239 11/24/20 0422 11/25/20 0734 11/25/20 1829 11/26/20 0422 11/27/20 0345 11/27/20 1642  WBC 9.7 8.8 10.8*  --  13.4* 12.3*  --   NEUTROABS 7.3 6.1 8.2*  --  10.1* 9.2*  --   HGB 7.9* 8.4* 7.6* 7.2* 6.8* 7.4* 7.7*  HCT 23.9* 25.5* 22.9* 22.4* 20.6* 22.7* 23.4*  MCV 90.5 90.4 90.2  --  90.7 91.2  --   PLT 460* 480* 489*  --  476* 488*  --    Basic Metabolic Panel: Recent Labs  Lab 11/23/20 0239 11/24/20 0422 11/25/20 0734 11/26/20 0422 11/27/20 0345  NA 135  133* 135 131* 132* 132*  K 4.3  4.2 4.3 3.9 4.2 4.5  CL 100  103 102 101 101 100  CO2 '23  23 26 '$ 21* 22 23  GLUCOSE 114*  121* 119* 126* 163* 132*  BUN '17  18 16 '$ 23* 24* 25*  CREATININE 1.86*  1.75* 1.83* 1.97* 1.95* 2.05*  CALCIUM 9.2  8.7* 8.7* 8.3* 8.1* 8.6*  MG 1.7 1.7 1.8 1.7 1.8  PHOS 3.5 3.7 3.7 3.0 3.5   GFR: Estimated Creatinine Clearance: 52.6 mL/min (A) (by C-G formula based on SCr of 2.05 mg/dL (H)). Liver Function Tests: Recent Labs  Lab 11/23/20 0239 11/24/20 0422 11/25/20 0734 11/26/20 0422 11/27/20 0345  ALBUMIN 2.2* 2.2* 2.2* 2.1* 2.3*   No results for input(s): LIPASE, AMYLASE in the last 168 hours. No results for input(s): AMMONIA in the last 168 hours. Coagulation Profile: No results for input(s): INR, PROTIME in the last 168 hours. Cardiac Enzymes: No results  for input(s): CKTOTAL, CKMB,  CKMBINDEX, TROPONINI in the last 168 hours. BNP (last 3 results) No results for input(s): PROBNP in the last 8760 hours. HbA1C: No results for input(s): HGBA1C in the last 72 hours. CBG: Recent Labs  Lab 11/27/20 1157 11/27/20 1655 11/27/20 2347 11/28/20 0654 11/28/20 1138  GLUCAP 114* 135* 116* 111* 149*   Lipid Profile: No results for input(s): CHOL, HDL, LDLCALC, TRIG, CHOLHDL, LDLDIRECT in the last 72 hours. Thyroid Function Tests: No results for input(s): TSH, T4TOTAL, FREET4, T3FREE, THYROIDAB in the last 72 hours. Anemia Panel: No results for input(s): VITAMINB12, FOLATE, FERRITIN, TIBC, IRON, RETICCTPCT in the last 72 hours. Sepsis Labs: No results for input(s): PROCALCITON, LATICACIDVEN in the last 168 hours.  No results found for this or any previous visit (from the past 240 hour(s)).   Radiology Studies: No results found.  Scheduled Meds:  sodium chloride   Intravenous Once   alfuzosin  10 mg Oral Q breakfast   allopurinol  100 mg Oral Daily   vitamin C  1,000 mg Oral Daily   aspirin EC  81 mg Oral Daily   atorvastatin  80 mg Oral Daily   dapsone  100 mg Oral Daily   docusate sodium  100 mg Oral Daily   dolutegravir  50 mg Oral Daily   doravirine  100 mg Oral Daily   empagliflozin  10 mg Oral Daily   escitalopram  20 mg Oral Daily   finasteride  5 mg Oral Daily   gabapentin  100 mg Oral TID   icosapent Ethyl  2 g Oral BID   insulin aspart  0-5 Units Subcutaneous QHS   lamiVUDine  300 mg Oral Daily   nutrition supplement (JUVEN)  1 packet Oral BID BM   oxybutynin  2.5 mg Oral BID   oxyCODONE  10 mg Oral QID   pantoprazole  40 mg Oral Daily   senna  2 tablet Oral QHS   zinc sulfate  220 mg Oral Daily   Continuous Infusions:  magnesium sulfate bolus IVPB       LOS: 41 days    Time spent: 25 mins    Shawna Clamp, MD [facility]   If 7PM-7AM, please contact night-coverage

## 2020-11-28 NOTE — TOC Progression Note (Signed)
Transition of Care [facility]) - Progression Note    Patient Details  Name: RINALDO SCHARPF MRN: [id number] Date of Birth: 1965/07/13  [facility]) CM/SW Contact  Sharlet Salina Mila Homer, LCSW Phone Number: 11/28/2020, 4:10 PM  Clinical Narrative:  Visited with patient today to talk with him about his discharge and SNF recommendation. Also in the room was his mother. Patient previously had 2 bed offers:: [facility] and [facility]. Patient declined both as he has been to [place] before and did not like it and he indicated that [facility] was too far for this mother to come to visit.   CSW had resent patient's clinicals to SNF's after confirming that he has [facility] in addition to [place]. [facility] made a bed offer and was contacted. They can accept patient if he is able to bring his HIV medications with him. Visited the room and patient was sitting in a chair and his mother was also present. Patient informed regarding [facility] and their request for him to bring his HIV medications, due to the costs of many HIV meds. Patient reported that he does not take HIV medications and this was discussed, based on H&P which did show that he was taking HIV meds prior to admission.  Patient informed CSW that he wanted to talk with the doctor and Dr. Dwyane Dee was informed. CSW was also advised by MD that patient is not medically ready for d/c at this time. The admissions director at [facility] was contacted and updated that patient not ready for discharge yet, and she would be informed later regarding patient's decision re: accepting their bed once he is closer to being ready for discharge and Clarene Critchley expressed understanding.   Expected Discharge Plan: Skilled Nursing Facility Barriers to Discharge: SNF Pending bed offer  Expected Discharge Plan and Services Expected Discharge Plan: [place] In-house Referral: Clinical Social Work Discharge  Planning Services: CM Consult Post Acute Care Choice: [place] arrangements for the past 2 months: Single Family Home                                       Social Determinants of Health (SDOH) Interventions  No SDOH interventions requested or needed at this time.  Readmission Risk Interventions Readmission Risk Prevention Plan 10/21/2020 11/13/2018 11/13/2018  Transportation Screening Complete - Complete  PCP or Specialist Appt within 3-5 Days - - -  [place] or [place] for [place] - - -  Medication Review Press [employer]) Complete - Complete  PCP or Specialist appointment within 3-5 days of discharge Complete Complete -  [place] or Home Care Consult Complete Complete -  SW Recovery Care/Counseling Consult Complete Complete -  Palliative Care Screening Not Applicable Not Applicable -  [place] Complete Not Applicable -  Some encounter information is confidential and restricted. Go to Review Flowsheets activity to see all data.  Some recent data might be hidden

## 2020-11-28 NOTE — Plan of Care (Signed)
  Problem: Clinical Measurements: Goal: Ability to avoid or minimize complications of infection will improve Outcome: Progressing   Problem: Activity: Goal: Risk for activity intolerance will decrease Outcome: Progressing   Problem: Pain Managment: Goal: General experience of comfort will improve Outcome: Progressing

## 2020-11-28 NOTE — Progress Notes (Signed)
OT Cancellation Note  Patient Details Name: Keith [name] MRN: [name] DOB: 06-22-1965   Cancelled Treatment:    Reason Eval/Treat Not Completed: Fatigue/lethargy limiting ability to participate Pt asleep, woke to therapist knocking. Pt politely declined therapy at this time, reporting increased lethargy after receiving dilaudid. OT will return as time allows and pt is appropriate.   [facility] OTR/L Acute Rehabilitation Services Office: [place] 11/28/2020, 4:00 PM

## 2020-11-29 DIAGNOSIS — N179 Acute kidney failure, unspecified: Secondary | ICD-10-CM | POA: Diagnosis not present

## 2020-11-29 LAB — CBC WITH DIFFERENTIAL/PLATELET
Abs Immature Granulocytes: 0.08 10*3/uL — ABNORMAL HIGH (ref 0.00–0.07)
Basophils Absolute: 0 10*3/uL (ref 0.0–0.1)
Basophils Relative: 0 %
Eosinophils Absolute: 0.3 10*3/uL (ref 0.0–0.5)
Eosinophils Relative: 2 %
HCT: 22.4 % — ABNORMAL LOW (ref 39.0–52.0)
Hemoglobin: 7.4 g/dL — ABNORMAL LOW (ref 13.0–17.0)
Immature Granulocytes: 1 %
Lymphocytes Relative: 15 %
Lymphs Abs: 1.7 10*3/uL (ref 0.7–4.0)
MCH: 30 pg (ref 26.0–34.0)
MCHC: 33 g/dL (ref 30.0–36.0)
MCV: 90.7 fL (ref 80.0–100.0)
Monocytes Absolute: 0.8 10*3/uL (ref 0.1–1.0)
Monocytes Relative: 7 %
Neutro Abs: 8.3 10*3/uL — ABNORMAL HIGH (ref 1.7–7.7)
Neutrophils Relative %: 75 %
Platelets: 509 10*3/uL — ABNORMAL HIGH (ref 150–400)
RBC: 2.47 MIL/uL — ABNORMAL LOW (ref 4.22–5.81)
RDW: 13.6 % (ref 11.5–15.5)
WBC: 11.1 10*3/uL — ABNORMAL HIGH (ref 4.0–10.5)
nRBC: 0 % (ref 0.0–0.2)

## 2020-11-29 LAB — RENAL FUNCTION PANEL
Albumin: 2.2 g/dL — ABNORMAL LOW (ref 3.5–5.0)
Anion gap: 7 (ref 5–15)
BUN: 29 mg/dL — ABNORMAL HIGH (ref 6–20)
CO2: 25 mmol/L (ref 22–32)
Calcium: 8.6 mg/dL — ABNORMAL LOW (ref 8.9–10.3)
Chloride: 101 mmol/L (ref 98–111)
Creatinine, Ser: 2.38 mg/dL — ABNORMAL HIGH (ref 0.61–1.24)
GFR, Estimated: 31 mL/min — ABNORMAL LOW (ref >60)
Glucose, Bld: 127 mg/dL — ABNORMAL HIGH (ref 70–99)
Phosphorus: 3.9 mg/dL (ref 2.5–4.6)
Potassium: 4.7 mmol/L (ref 3.5–5.1)
Sodium: 133 mmol/L — ABNORMAL LOW (ref 135–145)

## 2020-11-29 LAB — HEMOGLOBIN A1C
Hgb A1c MFr Bld: 5.9 % — ABNORMAL HIGH (ref 4.8–5.6)
Mean Plasma Glucose: 122.63 mg/dL

## 2020-11-29 LAB — MAGNESIUM
Magnesium: 2 mg/dL (ref 1.7–2.4)
Magnesium: 1.9 mg/dL (ref 1.7–2.4)

## 2020-11-29 LAB — GLUCOSE, CAPILLARY
Glucose-Capillary: 132 mg/dL — ABNORMAL HIGH (ref 70–99)
Glucose-Capillary: 168 mg/dL — ABNORMAL HIGH (ref 70–99)
Glucose-Capillary: 149 mg/dL — ABNORMAL HIGH (ref 70–99)
Glucose-Capillary: 129 mg/dL — ABNORMAL HIGH (ref 70–99)

## 2020-11-29 MED ORDER — SODIUM CHLORIDE 0.9 % IV SOLN: INTRAVENOUS | Status: DC

## 2020-11-29 NOTE — Progress Notes (Signed)
Occupational Therapy Treatment Patient Details Name: Keith [name] MRN: [name] DOB: Apr 05, 1966 Today's Date: 11/29/2020    History of present illness 55 y.o. male  who is sent to the ED by his orthopedic surgery provider due to concern for left heel ulcer that had failed outpatient therapy. with medical history significant for HIV, CKD 3B, type 2 diabetes, hypertension, hyperlipidemia, coronary artery disease, status post right below the knee amputation, now s/p L BKA 10/27/20. s/p REVISION L AMPUTATION BELOW KNEE on 8/12.   OT comments  Pt making good progress with all OT goals. This session, pt deferred OOB tasks, due to wanting to wait till PT worked with him and he was able to get his breakfast. Pt completed multiple exercises listed below, as well as bed mobility with min guard assist. Overall pt's UE strength is [facility] for scoot transfers and pt reporting that he is working on exercises on his own time as well. OT will continue to follow acutely.    Follow Up Recommendations  SNF    Equipment Recommendations  None recommended by OT    Recommendations for Other Services      Precautions / Restrictions Precautions Precautions: Fall Precaution Comments: B BKA's, RLE prosthesis Required Braces or Orthoses: Other Brace Other Brace: limb protector on left Restrictions Weight Bearing Restrictions: Yes LLE Weight Bearing: Non weight bearing Other Position/Activity Restrictions: NWB on left       Mobility Bed Mobility Overal bed mobility: Needs Assistance Bed Mobility: Rolling;Supine to Sit;Sit to Supine Rolling: Min guard   Supine to sit: Min guard Sit to supine: Min guard   General bed mobility comments: Min Guard for all bed mobility with HOB elevated and heavy use of bed rails. Pt also completed multiple long sits with no difficulty, using railings.    Transfers Overall transfer level: Needs assistance Equipment used: None Transfers: Lateral/Scoot Transfers           Lateral/Scoot Transfers: Min guard General transfer comment: Pt working on lateral scoots, deferred recliner this session, reporting he wanted to stay in bed until PT came to work with him.    Balance Overall balance assessment: Mild deficits observed, not formally tested                                         ADL either performed or assessed with clinical judgement   ADL Overall ADL's : Needs assistance/impaired                                       General ADL Comments: Focused on bed level exercises to increase strength for participating in transfers and functional mobility.     Vision   Vision Assessment?: No apparent visual deficits   Perception     Praxis      Cognition Arousal/Alertness: Awake/alert Behavior During Therapy: WFL for tasks assessed/performed Overall Cognitive Status: No family/caregiver present to determine baseline cognitive functioning                                 General Comments: Pt following all commands and responding appropriately.        Exercises Exercises: Other exercises;General Upper Extremity General Exercises - Upper Extremity Shoulder Flexion: AROM;Both;10 reps;Seated Shoulder ABduction: AROM;Both;10  reps;Seated Elbow Flexion: AROM;Both;10 reps;Seated Elbow Extension: AROM;Both;10 reps;Seated Digit Composite Flexion: Squeeze ball;AROM;20 reps;Both Other Exercises Other Exercises: Pull up into long sits x10 reps Other Exercises: Pull across x10reps Other Exercises: chair push ups x10 reps Other Exercises: Punch outs x10 reps   Shoulder Instructions       General Comments VSS    Pertinent Vitals/ Pain       Pain Assessment: 0-10 Pain Score: 5  Pain Location: L residual limb Pain Descriptors / Indicators: Grimacing;Aching Pain Intervention(s): Limited activity within patient's tolerance;Monitored during session;Repositioned  Home Living                                           Prior Functioning/Environment              Frequency  Min 2X/week        Progress Toward Goals  OT Goals(current goals can now be found in the care plan section)  Progress towards OT goals: Progressing toward goals  Acute Rehab OT Goals Patient Stated Goal: to get better OT Goal Formulation: With patient Time For Goal Achievement: 12/07/20 Potential to Achieve Goals: Good ADL Goals Pt Will Perform Lower Body Bathing: with mod assist;sit to/from stand Pt Will Perform Upper Body Dressing: with set-up;sitting Pt Will Perform Lower Body Dressing: sit to/from stand;with mod assist Pt Will Transfer to Toilet: with min assist;stand pivot transfer Pt Will Perform Toileting - Clothing Manipulation and hygiene: with mod assist;sit to/from stand  Plan Discharge plan remains appropriate    Co-evaluation                 AM-PAC OT "6 Clicks" Daily Activity     Outcome Measure   Help from another person eating meals?: None Help from another person taking care of personal grooming?: A Little Help from another person toileting, which includes using toliet, bedpan, or urinal?: A Lot Help from another person bathing (including washing, rinsing, drying)?: A Lot Help from another person to put on and taking off regular upper body clothing?: A Little Help from another person to put on and taking off regular lower body clothing?: A Lot 6 Click Score: 16    End of Session    OT Visit Diagnosis: Other abnormalities of gait and mobility (R26.89);Muscle weakness (generalized) (M62.81);Pain Pain - Right/Left: Left Pain - part of body: Leg   Activity Tolerance Patient tolerated treatment well   Patient Left in bed;with call bell/phone within reach;with bed alarm set   Nurse Communication Mobility status        Time: [id number] OT Time Calculation (min): 26 min  Charges: OT General Charges $OT Visit: 1 Visit OT Treatments $Therapeutic Exercise:  23-37 mins  Timeka Goette H., OTR/L Acute Rehabilitation  Sai Zinn Elane Yolanda Bonine 11/29/2020, 10:17 AM

## 2020-11-29 NOTE — Progress Notes (Signed)
Patient is status post left below-knee amputation and revision.  Patient has had recurrent dehiscence is at the lateral end of the wound.  He had significant bleeding a couple days ago.  He states now he is having much less bleeding   Dressing was removed he does have recurrence of the dehiscence of approximately 4 cm dehiscence into the subcu fat with gaping.  There is no sign of infection no ascending cellulitis   Will discuss with [name] whether to treat this conservatively or whether he would need another revision

## 2020-11-29 NOTE — Progress Notes (Addendum)
Patient was working with PT and this RN was called to the room. Patient was seen sitting on the bed and left BKA was bleeding through his dressing. This RN rewrapped patient stump and contacted the calling service for the ortho PA. Awaiting reply. MD was also notify. Will continue to monitor patient

## 2020-11-29 NOTE — Progress Notes (Signed)
PT Cancellation Note  Patient Details Name: Keith [name] MRN: [name] DOB: [birth year]   Cancelled Treatment:    Reason Eval/Treat Not Completed: Patient declined, no reason specified. Pt reports feeling tired and unwell, requests PT return at a later time. PT will attempt to follow up as time allows.   Zenaida Niece 11/29/2020, 3:27 PM

## 2020-11-29 NOTE — Progress Notes (Signed)
Orthopedic Tech Progress Note Patient Details:  SOSAIA BEALES [birth year] [name]  RN called requesting we apply COBAN TO A WOUND I told her I could come a give her some but aren't allowed to do WOUND CARE. She sounded unhappy.  [name], male   DOB: 30-Jul-1965, 55 y.o.   MRN: [name]  [name] 11/29/2020, 6:08 PM

## 2020-11-29 NOTE — Progress Notes (Signed)
PROGRESS NOTE  Keith [name]  DOB: [birth year]  [name], NP [id number]  DOA: 10/17/2020  LOS: 35 days  Hospital Day: 59   Chief Complaint  Patient presents with   Foot Ulcer    Brief narrative: Keith [name] is a 55 y.o. male with PMH significant for DM2, HTN, HLD, CAD, CKD 3B, HIV, right BKA status.  Patient was sent to the ED on 7/12 by his orthopedic surgeon due to concern of left heel ulcer that failed outpatient therapy.   Subsequently, MRI confirmed osteomyelitis.  He underwent left transtibial amputation by [name].  During the hospitalization, he had worsening renal function.  Nephrology was consulted.  He had temporary dialysis catheter placed and dialysis was started.  Later, he developed hematuria and required fulguration and CBI along with blood transfusions.  Renal function improved; dialysis was stopped and nephrology signed off.  Subsequently, his wound dehisced with bleeding requiring blood transfusion.   He underwent left below-knee amputation revision by [name] on 11/17/2020.  Subjective: Patient was seen and examined this morning. Propped up in bed.  Not in distress.  Taking his breakfast.  No new symptoms. Labs this morning with creatinine elevated up to 2.38, hemoglobin down to 7.4.  Assessment/Plan: Acute kidney injury with metabolic acidosis > Improved. -Patient presented with worsening renal functions and started on dialysis during this admission but was then subsequently stopped and nephrology signed off.  AKI was felt to be due to ATN.  Tunneled dialysis catheter has already been removed. -Renal ultrasound was negative for hydronephrosis. -In last 24 hours, his creatinine has worsened.  Started on normal saline at 75 mill per hour this morning.  Repeat creatinine tomorrow. Recent Labs    11/20/20 0454 11/21/20 0425 11/22/20 0511 11/23/20 0239 11/24/20 0422 11/25/20 0734 11/26/20 0422 11/27/20 0345 11/28/20 1630 11/29/20 0721   BUN '20 18 16 17  18 16 '$ 23* 24* 25* 27* 29*  CREATININE 2.08* 1.87* 1.79* 1.86*  1.75* 1.83* 1.97* 1.95* 2.05* 2.07* 2.38*  CO2 21* 21* '23 23  23 26 '$ 21* '22 23 24 25   '$ Hematuria Acute urinary retention 11/08/2020 -Foley catheter placement with drainage of clots.  Urology was consulted  8/4 - underwent cystoscopy with fulguration and started on CBI and Proscar.  Subsequently CBI was discontinued. 8/15-Foley catheter discontinued. 8/22-patient had another episode of hematuria and clots. Urology recommended Proscar, Uroxatrol and IV hydration. Hematuria is improving   Anemia due to acute blood loss from bladder and left stump -Hemoglobin has been running low mostly between 7 and 8.  5 units of PRBC transfused so far this admission.  -Hemoglobin down to 7.4 this morning.  Continue to monitor.  May need blood transfusion if further drop tomorrow. Recent Labs    04/26/20 1228 04/27/20 0802 11/26/20 0422 11/27/20 0345 11/27/20 1642 11/28/20 1630 11/29/20 0721  HGB 10.7*   < > 6.8* 7.4* 7.7* 7.5* 7.4*  MCV 84.5   < > 90.7 91.2  --  90.1 90.7  VITAMINB12 451  --   --   --   --   --   --   FOLATE 8.7  --   --   --   --   --   --   FERRITIN 347*  --   --   --   --   --   --   TIBC 276  --   --   --   --   --   --  IRON 71  --   --   --   --   --   --   RETICCTPCT 1.8  --   --   --   --   --   --    < > = values in this interval not displayed.    Left heel osteomyelitis Left BKA after failed transtibial amputation on 10/27/20 with subsequent dehiscence of wound with bleeding Bilateral BKA status -Patient has had significant bleeding from the wound with wound dehiscence requiring blood transfusions. -Orthopedics following: Status post revision of left below-knee amputation on 11/17/2020 by [name].  Patient already -Wound VAC was removed on 8/18. Further management as per ortho. -Continue pressure dressing and daily dressing.  Uncontrolled type 2 diabetes mellitus Hyperglycemia -A1c  13.9 on May 2022.  Repeat A1c -Currently blood sugar level is controlled on Jardiance 10 mg daily and sliding scale insulin Lab Results  Component Value Date   HGBA1C 13.9 (H) 08/29/2020   Recent Labs  Lab 11/28/20 1138 11/28/20 1703 11/28/20 2052 11/29/20 0710 11/29/20 1203  GLUCAP 149* 138* 156* 132* 168*    Hyponatremia -Stable between 130 and 135.   CAD status post stenting in 03/2020 -Continue aspirin.  Because of recurrent bleeding, Brilinta has been discontinued for now since 11/19/2020 as per cardiology recommendations.   -Once bleeding stabilizes, will resume Brilinta.  Continue Vascepa and Lipitor  Hyperkalemia Resolved.  HIV Continue current regimen of antiretrovirals and dapsone as per ID.   Outpatient follow-up with ID   Mobility: PT eval obtained.  SNF recommended Code Status:   Code Status: Full Code  Nutritional status: Body mass index is 38.4 kg/m.     Diet:  Diet Order             Diet Carb Modified Fluid consistency: Thin; Room service appropriate? Yes  Diet effective now                  DVT prophylaxis: Not on heparin products because of frequent bleeding.  Bilateral BKA status, unable to use SCDs  Antimicrobials: None Fluid: Normal saline at 75 mill per hour Consultants: Vascular surgery Family Communication: None at bedside  Status is: Inpatient  Remains inpatient appropriate because: Needs to monitor hemoglobin, creatinine, bleeding  Dispo: The patient is from: Home              Anticipated d/c is to: SNF              Patient currently is not medically stable to d/c.   Difficult to place patient No     Infusions:   sodium chloride 75 mL/hr at 11/29/20 0914   magnesium sulfate bolus IVPB      Scheduled Meds:  sodium chloride   Intravenous Once   alfuzosin  10 mg Oral Q breakfast   allopurinol  100 mg Oral Daily   vitamin C  1,000 mg Oral Daily   aspirin EC  81 mg Oral Daily   atorvastatin  80 mg Oral Daily   dapsone   100 mg Oral Daily   docusate sodium  100 mg Oral Daily   dolutegravir  50 mg Oral Daily   doravirine  100 mg Oral Daily   empagliflozin  10 mg Oral Daily   escitalopram  20 mg Oral Daily   finasteride  5 mg Oral Daily   gabapentin  100 mg Oral TID   icosapent Ethyl  2 g Oral BID   insulin aspart  0-5  Units Subcutaneous QHS   lamiVUDine  300 mg Oral Daily   nutrition supplement (JUVEN)  1 packet Oral BID BM   oxybutynin  2.5 mg Oral BID   oxyCODONE  10 mg Oral QID   pantoprazole  40 mg Oral Daily   senna  2 tablet Oral QHS   zinc sulfate  220 mg Oral Daily    Antimicrobials: Anti-infectives (From admission, onward)    Start     Dose/Rate Route Frequency Ordered Stop   11/18/20 0100  ceFAZolin (ANCEF) IVPB 2g/100 mL premix        2 g 200 mL/hr over 30 Minutes Intravenous Every 8 hours 11/17/20 2045 11/18/20 1038   11/17/20 0600  ceFAZolin (ANCEF) IVPB 3g/100 mL premix        3 g 200 mL/hr over 30 Minutes Intravenous On call to O.R. 11/16/20 1941 11/17/20 1901   11/11/20 1200  vancomycin (VANCOREADY) IVPB 500 mg/100 mL  Status:  Discontinued        500 mg 100 mL/hr over 60 Minutes Intravenous Every T-Th-Sa (Hemodialysis) 11/09/20 1414 11/11/20 1328   11/10/20 0000  Dolutegravir-lamiVUDine (DOVATO) 50-300 MG TABS        1 tablet Oral Daily 11/10/20 1048     11/10/20 0000  doravirine (PIFELTRO) 100 MG TABS tablet        100 mg Oral Daily 11/10/20 1048     11/08/20 1000  dolutegravir (TIVICAY) tablet 50 mg        50 mg Oral Daily 11/07/20 1451     11/08/20 1000  lamiVUDine (EPIVIR) tablet 300 mg        300 mg Oral Daily 11/07/20 1451     11/08/20 1000  doravirine (PIFELTRO) tablet 100 mg        100 mg Oral Daily 11/07/20 1451     11/02/20 1130  ceFAZolin (ANCEF) IVPB 2g/100 mL premix        2 g 200 mL/hr over 30 Minutes Intravenous To Radiology 11/02/20 1042 11/02/20 1704   10/27/20 1245  ceFAZolin (ANCEF) IVPB 3g/100 mL premix        3 g 200 mL/hr over 30 Minutes Intravenous  On call to O.R. 10/27/20 0755 10/27/20 1608   10/20/20 1415  linezolid (ZYVOX) tablet 600 mg  Status:  Discontinued        600 mg Oral Every 12 hours 10/20/20 1326 10/28/20 1115   10/19/20 1445  metroNIDAZOLE (FLAGYL) tablet 500 mg  Status:  Discontinued        500 mg Oral Every 12 hours 10/19/20 1347 10/28/20 1115   10/18/20 1600  ceFEPIme (MAXIPIME) 2 g in sodium chloride 0.9 % 100 mL IVPB  Status:  Discontinued        2 g 200 mL/hr over 30 Minutes Intravenous Every 12 hours 10/18/20 0414 10/28/20 1112   10/18/20 1000  dapsone tablet 100 mg        100 mg Oral Daily 10/18/20 0425     10/18/20 1000  bictegravir-emtricitabine-tenofovir AF (BIKTARVY) 50-200-25 MG per tablet 1 tablet  Status:  Discontinued        1 tablet Oral Daily 10/18/20 0610 11/07/20 1451   10/18/20 0415  metroNIDAZOLE (FLAGYL) IVPB 500 mg  Status:  Discontinued        500 mg 100 mL/hr over 60 Minutes Intravenous Every 8 hours 10/18/20 0404 10/19/20 1347   10/18/20 0415  ceFEPIme (MAXIPIME) 2 g in sodium chloride 0.9 % 100 mL IVPB  2 g 200 mL/hr over 30 Minutes Intravenous  Once 10/18/20 0414 10/18/20 0500   10/18/20 0345  vancomycin (VANCOREADY) IVPB 2000 mg/400 mL        2,000 mg 200 mL/hr over 120 Minutes Intravenous  Once 10/18/20 0335 10/18/20 0608       PRN meds: acetaminophen, alum & mag hydroxide-simeth, bisacodyl, diphenoxylate-atropine, guaiFENesin-dextromethorphan, hydrALAZINE, HYDROmorphone (DILAUDID) injection, labetalol, magnesium sulfate bolus IVPB, metoprolol tartrate, ondansetron, oxyCODONE, oxyCODONE, phenol, polyethylene glycol, potassium chloride   Objective: Vitals:   11/29/20 0426 11/29/20 1006  BP: 105/73 114/70  Pulse: 88 86  Resp: 17 18  Temp: 98.7 F (37.1 C) 98 F (36.7 C)  SpO2: 100% 99%    Intake/Output Summary (Last 24 hours) at 11/29/2020 1617 Last data filed at 11/29/2020 1400 Gross per 24 hour  Intake 951.41 ml  Output 850 ml  Net 101.41 ml   Filed Weights    11/20/20 0618 11/23/20 0618 11/23/20 2144  Weight: 117.3 kg 121.4 kg 121.4 kg   Weight change:  Body mass index is 38.4 kg/m.   Physical Exam: General exam: Pleasant, middle-aged African-American male.  Not in distress Skin: No rashes, lesions or ulcers. HEENT: Atraumatic, normocephalic, no obvious bleeding Lungs: Clear to auscultation bilaterally CVS: Regular rate and rhythm, no murmur GI/Abd soft, nontender, nondistended, bowel sound present CNS: Alert, awake, oriented x3 Psychiatry: Mood appropriate Extremities: Prior right BKA status. New left BKA with bandage around lower leg.  Data Review: I have personally reviewed the laboratory data and studies available.  Recent Labs  Lab 11/25/20 0734 11/25/20 1829 11/26/20 0422 11/27/20 0345 11/27/20 1642 11/28/20 1630 11/29/20 0721  WBC 10.8*  --  13.4* 12.3*  --  9.3 11.1*  NEUTROABS 8.2*  --  10.1* 9.2*  --  6.8 8.3*  HGB 7.6*   < > 6.8* 7.4* 7.7* 7.5* 7.4*  HCT 22.9*   < > 20.6* 22.7* 23.4* 22.7* 22.4*  MCV 90.2  --  90.7 91.2  --  90.1 90.7  PLT 489*  --  476* 488*  --  532* 509*   < > = values in this interval not displayed.   Recent Labs  Lab 11/25/20 0734 11/26/20 0422 11/27/20 0345 11/28/20 0134 11/28/20 1630 11/29/20 0721  NA 131* 132* 132*  --  134* 133*  K 3.9 4.2 4.5  --  4.7 4.7  CL 101 101 100  --  102 101  CO2 21* 22 23  --  24 25  GLUCOSE 126* 163* 132*  --  140* 127*  BUN 23* 24* 25*  --  27* 29*  CREATININE 1.97* 1.95* 2.05*  --  2.07* 2.38*  CALCIUM 8.3* 8.1* 8.6*  --  8.6* 8.6*  MG 1.8 1.7 1.8 2.0  --  1.9  PHOS 3.7 3.0 3.5  --  3.9 3.9    F/u labs ordered Unresulted Labs (From admission, onward)     Start     Ordered   11/29/20 1614  Hemoglobin A1c  Add-on,   AD       Question:  Specimen collection method  Answer:  Lab=Lab collect   11/29/20 1614   11/21/20 0500  CBC with Differential/Platelet  Daily,   R     Question:  Specimen collection method  Answer:  Lab=Lab collect   11/20/20  1010   11/21/20 0500  Magnesium  Daily,   R     Question:  Specimen collection method  Answer:  Lab=Lab collect   11/20/20 1010  11/02/20 0500  Renal function panel  Daily,   R     Question:  Specimen collection method  Answer:  Lab=Lab collect   11/01/20 1153            Signed, Terrilee Croak, MD [facility] 11/29/2020

## 2020-11-29 NOTE — Progress Notes (Signed)
Physical Therapy Treatment Patient Details Name: Keith [name] MRN: [name] DOB: [birth year] Today's Date: 11/29/2020    History of Present Illness 55 y.o. male  who is sent to the ED by his orthopedic surgery provider due to concern for left heel ulcer that had failed outpatient therapy. with medical history significant for HIV, CKD 3B, type 2 diabetes, hypertension, hyperlipidemia, coronary artery disease, status post right below the knee amputation, now s/p L BKA 10/27/20. s/p REVISION L AMPUTATION BELOW KNEE on 8/12.    PT Comments    Pt tolerates treatment well, standing for multiple attempts with physical assistance and attempting to initiate gait training (little success with hopping on this date). During session pt's L residual limb began to bleed, RN called and assisted with dressing change and LE elevation. PT session ended prematurely due to bleeding. PT will continue to follow to progress mobility. PT continues to recommend SNF placement at this time.   Follow Up Recommendations  SNF     Equipment Recommendations  Wheelchair (measurements PT);Wheelchair cushion (measurements PT)    Recommendations for Other Services       Precautions / Restrictions Precautions Precautions: Fall Precaution Comments: B BKA's, RLE prosthesis Restrictions Weight Bearing Restrictions: Yes RLE Weight Bearing: Weight bearing as tolerated LLE Weight Bearing: Non weight bearing    Mobility  Bed Mobility Overal bed mobility: Needs Assistance Bed Mobility: Supine to Sit;Sit to Supine     Supine to sit: Supervision;HOB elevated Sit to supine: Supervision;HOB elevated   General bed mobility comments: use of rails    Transfers Overall transfer level: Needs assistance Equipment used: Rolling walker (2 wheeled) Transfers: Sit to/from Stand Sit to Stand: Mod assist;From elevated surface            Ambulation/Gait Ambulation/Gait assistance: Mod assist Gait Distance (Feet): 0  Feet         General Gait Details: pt attempts to hop but is unable to clear R prosthetic from floor   Stairs             Wheelchair Mobility    Modified Rankin (Stroke Patients Only)       Balance Overall balance assessment: Needs assistance Sitting-balance support: No upper extremity supported;Feet supported Sitting balance-Leahy Scale: Good     Standing balance support: Bilateral upper extremity supported Standing balance-Leahy Scale: Poor Standing balance comment: min-modA with BUE support of RW                            Cognition Arousal/Alertness: Awake/alert Behavior During Therapy: WFL for tasks assessed/performed Overall Cognitive Status: Impaired/Different from baseline Area of Impairment: Awareness;Safety/judgement                         Safety/Judgement: Decreased awareness of safety;Decreased awareness of deficits Awareness: Emergent   General Comments: pt reports wanting to continue attempts to hop despite bleeding from leg during session      Exercises      General Comments General comments (skin integrity, edema, etc.): VSS on RA, after 2nd attempt at hopping pt's L residual limb begins to drip blood to floor, PT calls nurse and assists with dressing change and LE elevation.      Pertinent Vitals/Pain Pain Assessment: No/denies pain    Home Living                      Prior Function  PT Goals (current goals can now be found in the care plan section) Acute Rehab PT Goals Patient Stated Goal: to get better Progress towards PT goals: Progressing toward goals    Frequency    Min 3X/week      PT Plan Current plan remains appropriate    Co-evaluation              AM-PAC PT "6 Clicks" Mobility   Outcome Measure  Help needed turning from your back to your side while in a flat bed without using bedrails?: None Help needed moving from lying on your back to sitting on the side of a  flat bed without using bedrails?: A Little Help needed moving to and from a bed to a chair (including a wheelchair)?: A Little Help needed standing up from a chair using your arms (e.g., wheelchair or bedside chair)?: A Lot Help needed to walk in hospital room?: Total Help needed climbing 3-5 steps with a railing? : Total 6 Click Score: 14    End of Session   Activity Tolerance: Treatment limited secondary to medical complications (Comment) (bleeding from L residual limb) Patient left: in bed;with call bell/phone within reach;with bed alarm set;with nursing/sitter in room Nurse Communication: Mobility status PT Visit Diagnosis: Unsteadiness on feet (R26.81);Other abnormalities of gait and mobility (R26.89);Pain;Muscle weakness (generalized) (M62.81) Pain - Right/Left: Left Pain - part of body: Leg     Time: [id number] PT Time Calculation (min) (ACUTE ONLY): 30 min  Charges:  $Therapeutic Activity: 23-37 mins                     Zenaida Niece, PT, DPT Acute Rehabilitation Pager: [place] Kelyn Koskela 11/29/2020, 5:17 PM

## 2020-11-29 NOTE — Plan of Care (Signed)
  Problem: Nutrition: Goal: Adequate nutrition will be maintained Outcome: Progressing   

## 2020-11-30 ENCOUNTER — Other Ambulatory Visit: Payer: Self-pay | Admitting: Physician Assistant

## 2020-11-30 DIAGNOSIS — N179 Acute kidney failure, unspecified: Secondary | ICD-10-CM | POA: Diagnosis not present

## 2020-11-30 LAB — GLUCOSE, CAPILLARY
Glucose-Capillary: 161 mg/dL — ABNORMAL HIGH (ref 70–99)
Glucose-Capillary: 154 mg/dL — ABNORMAL HIGH (ref 70–99)
Glucose-Capillary: 140 mg/dL — ABNORMAL HIGH (ref 70–99)

## 2020-11-30 LAB — RENAL FUNCTION PANEL
Albumin: 2.2 g/dL — ABNORMAL LOW (ref 3.5–5.0)
Anion gap: 11 (ref 5–15)
BUN: 29 mg/dL — ABNORMAL HIGH (ref 6–20)
CO2: 22 mmol/L (ref 22–32)
Calcium: 8.8 mg/dL — ABNORMAL LOW (ref 8.9–10.3)
Chloride: 101 mmol/L (ref 98–111)
Creatinine, Ser: 2.3 mg/dL — ABNORMAL HIGH (ref 0.61–1.24)
GFR, Estimated: 33 mL/min — ABNORMAL LOW (ref >60)
Glucose, Bld: 121 mg/dL — ABNORMAL HIGH (ref 70–99)
Phosphorus: 3.9 mg/dL (ref 2.5–4.6)
Potassium: 4.6 mmol/L (ref 3.5–5.1)
Sodium: 134 mmol/L — ABNORMAL LOW (ref 135–145)

## 2020-11-30 LAB — HEMOGLOBIN AND HEMATOCRIT, BLOOD
HCT: 22.1 % — ABNORMAL LOW (ref 39.0–52.0)
HCT: 24.3 % — ABNORMAL LOW (ref 39.0–52.0)
Hemoglobin: 7.2 g/dL — ABNORMAL LOW (ref 13.0–17.0)
Hemoglobin: 7.9 g/dL — ABNORMAL LOW (ref 13.0–17.0)

## 2020-11-30 LAB — CBC WITH DIFFERENTIAL/PLATELET
Abs Immature Granulocytes: 0.12 10*3/uL — ABNORMAL HIGH (ref 0.00–0.07)
Basophils Absolute: 0 10*3/uL (ref 0.0–0.1)
Basophils Relative: 0 %
Eosinophils Absolute: 0.3 10*3/uL (ref 0.0–0.5)
Eosinophils Relative: 3 %
HCT: 20.3 % — ABNORMAL LOW (ref 39.0–52.0)
Hemoglobin: 6.5 g/dL — CL (ref 13.0–17.0)
Immature Granulocytes: 1 %
Lymphocytes Relative: 16 %
Lymphs Abs: 1.7 10*3/uL (ref 0.7–4.0)
MCH: 29.5 pg (ref 26.0–34.0)
MCHC: 32 g/dL (ref 30.0–36.0)
MCV: 92.3 fL (ref 80.0–100.0)
Monocytes Absolute: 0.9 10*3/uL (ref 0.1–1.0)
Monocytes Relative: 8 %
Neutro Abs: 7.8 10*3/uL — ABNORMAL HIGH (ref 1.7–7.7)
Neutrophils Relative %: 72 %
Platelets: 475 10*3/uL — ABNORMAL HIGH (ref 150–400)
RBC: 2.2 MIL/uL — ABNORMAL LOW (ref 4.22–5.81)
RDW: 13.8 % (ref 11.5–15.5)
WBC: 10.9 10*3/uL — ABNORMAL HIGH (ref 4.0–10.5)
nRBC: 0 % (ref 0.0–0.2)

## 2020-11-30 LAB — PREPARE RBC (CROSSMATCH)

## 2020-11-30 LAB — MAGNESIUM: Magnesium: 1.9 mg/dL (ref 1.7–2.4)

## 2020-11-30 MED ORDER — SODIUM CHLORIDE 0.9% IV SOLUTION: Freq: Once | INTRAVENOUS | Status: AC

## 2020-11-30 MED ORDER — CEFAZOLIN SODIUM-DEXTROSE 2-4 GM/100ML-% IV SOLN: 2.0000 g | INTRAVENOUS | Status: DC

## 2020-11-30 MED ORDER — CEFAZOLIN IN SODIUM CHLORIDE 3-0.9 GM/100ML-% IV SOLN
3.0000 g | INTRAVENOUS | Status: AC
  Filled 2020-11-30: qty 100

## 2020-11-30 NOTE — Progress Notes (Signed)
Cross coverage note:   Patient has had bleeding from BKA wound requiring transfusions. Hgb 6.5 this am. Plan to transfuse 1 unit RBC.

## 2020-11-30 NOTE — Progress Notes (Signed)
PROGRESS NOTE  Keith [name]  DOB: [birth year]  [name], NP [id number]  DOA: 10/17/2020  LOS: 63 days  Hospital Day: 77   Chief Complaint  Patient presents with   Foot Ulcer    Brief narrative: Keith [name] is a 55 y.o. male with PMH significant for DM2, HTN, HLD, CAD, CKD 3B, HIV, right BKA status.  Patient was sent to the ED on 7/12 by his orthopedic surgeon due to concern of left heel ulcer that failed outpatient therapy.   Subsequently, MRI confirmed osteomyelitis.  He underwent left transtibial amputation by [name].  During the hospitalization, he had worsening renal function.  Nephrology was consulted.  He had temporary dialysis catheter placed and dialysis was started.  Later, he developed hematuria and required fulguration and CBI along with blood transfusions.  Renal function improved; dialysis was stopped and nephrology signed off.  Subsequently, his wound dehisced with bleeding requiring blood transfusion.   He underwent left below-knee amputation revision by [name] on 11/17/2020.  Subjective: Patient was seen and examined this morning. Lying on bed.  Not in distress.  Event from last 24 hours noted.  Patient had bleeding from his left BKA stump.  Ultimately controlled after struggled.  Hemoglobin down to 6.5 today.  1 unit of PRBC was transfused.  Assessment/Plan: Acute kidney injury with metabolic acidosis > Improved. -Patient presented with worsening renal functions and started on dialysis during this admission but was then subsequently stopped and nephrology signed off.  AKI was felt to be due to ATN.  Tunneled dialysis catheter has already been removed. -Renal ultrasound was negative for hydronephrosis. -Yesterday I started him on IV fluid because of progressively worsening creatinine.  Creatinine slightly improved to 2.3 today.  Continue IV hydration.  Repeat creatinine tomorrow. Recent Labs    11/21/20 0425 11/22/20 0511 11/23/20 0239  11/24/20 0422 11/25/20 0734 11/26/20 0422 11/27/20 0345 11/28/20 1630 11/29/20 0721 11/30/20 0457  BUN '18 16 17  18 16 '$ 23* 24* 25* 27* 29* 29*  CREATININE 1.87* 1.79* 1.86*  1.75* 1.83* 1.97* 1.95* 2.05* 2.07* 2.38* 2.30*  CO2 21* '23 23  23 26 '$ 21* '22 23 24 25 22   '$ Left heel osteomyelitis Left BKA after failed transtibial amputation on 10/27/20 with subsequent dehiscence of wound with bleeding Bilateral BKA status -Patient has had significant intermittent bleeding from the wound with wound dehiscence requiring blood transfusions. -Wound VAC was removed on 8/18. Further management as per ortho. -Continue pressure dressing and daily dressing. -Management of stump site bleeding per orthopedics.  Hematuria Acute urinary retention 11/08/2020 -Foley catheter placement with drainage of clots.  Urology was consulted  8/4 - underwent cystoscopy with fulguration and started on CBI and Proscar.  Subsequently CBI was discontinued. 8/15-Foley catheter discontinued. 8/22-patient had another episode of hematuria and clots. Urology recommended Proscar, Uroxatrol and IV hydration. Hematuria is improving   Anemia due to acute blood loss from bladder and left stump -Hemoglobin has been running low mostly between 7 and 8.  -Hemoglobin further down to 6.5 this morning because of stomach bleeding last 24 hours.  1 unit of PRBC transfused this morning.  6 units of PRBC transfused so far this admission.  -Continue to monitor hemoglobin. Recent Labs    04/26/20 1228 04/27/20 0802 11/27/20 0345 11/27/20 1642 11/28/20 1630 11/29/20 0721 11/30/20 0457  HGB 10.7*   < > 7.4* 7.7* 7.5* 7.4* 6.5*  MCV 84.5   < > 91.2  --  90.1 90.7  92.3  VITAMINB12 451  --   --   --   --   --   --   FOLATE 8.7  --   --   --   --   --   --   FERRITIN 347*  --   --   --   --   --   --   TIBC 276  --   --   --   --   --   --   IRON 71  --   --   --   --   --   --   RETICCTPCT 1.8  --   --   --   --   --   --    < > =  values in this interval not displayed.   Uncontrolled type 2 diabetes mellitus Hyperglycemia -A1c 13.9 on May 2022.  Repeat A1c on 8/24 is at 5.9.  Wonder if it is truly low because of diet controlled in the hospital or spuriously low due to frequent blood loss and multiple transfusions.   -Currently blood sugar level is controlled on Jardiance 10 mg daily and sliding scale insulin Recent Labs  Lab 11/28/20 2052 11/29/20 0710 11/29/20 1203 11/29/20 1633 11/29/20 2028  GLUCAP 156* 132* 168* 149* 129*    Hyponatremia -Stable between 130 and 135.   CAD status post stenting in 03/2020 -Continue aspirin.  Because of recurrent bleeding, Brilinta has been discontinued for now since 11/19/2020 as per cardiology recommendations.   -Once bleeding stabilizes, will resume Brilinta.  Continue Vascepa and Lipitor  Hyperkalemia Resolved.  HIV Continue current regimen of antiretrovirals and dapsone as per ID.   Outpatient follow-up with ID   Mobility: PT eval obtained.  SNF recommended Code Status:   Code Status: Full Code  Nutritional status: Body mass index is 38.4 kg/m.     Diet:  Diet Order             Diet Carb Modified Fluid consistency: Thin; Room service appropriate? Yes  Diet effective now                  DVT prophylaxis: Not on heparin products because of frequent bleeding.  Bilateral BKA status, unable to use SCDs  Antimicrobials: None Fluid: Normal saline at 75 mill per hour Consultants: Vascular surgery Family Communication: None at bedside  Status is: Inpatient  Remains inpatient appropriate because: Needs to monitor hemoglobin, creatinine, bleeding  Dispo: The patient is from: Home              Anticipated d/c is to: SNF              Patient currently is not medically stable to d/c.   Difficult to place patient No     Infusions:   sodium chloride 75 mL/hr at 11/29/20 2312   magnesium sulfate bolus IVPB      Scheduled Meds:  alfuzosin  10 mg Oral  Q breakfast   allopurinol  100 mg Oral Daily   vitamin C  1,000 mg Oral Daily   aspirin EC  81 mg Oral Daily   atorvastatin  80 mg Oral Daily   dapsone  100 mg Oral Daily   docusate sodium  100 mg Oral Daily   dolutegravir  50 mg Oral Daily   doravirine  100 mg Oral Daily   empagliflozin  10 mg Oral Daily   escitalopram  20 mg Oral Daily   finasteride  5 mg  Oral Daily   gabapentin  100 mg Oral TID   icosapent Ethyl  2 g Oral BID   insulin aspart  0-5 Units Subcutaneous QHS   lamiVUDine  300 mg Oral Daily   nutrition supplement (JUVEN)  1 packet Oral BID BM   oxybutynin  2.5 mg Oral BID   oxyCODONE  10 mg Oral QID   pantoprazole  40 mg Oral Daily   senna  2 tablet Oral QHS   zinc sulfate  220 mg Oral Daily    Antimicrobials: Anti-infectives (From admission, onward)    Start     Dose/Rate Route Frequency Ordered Stop   11/18/20 0100  ceFAZolin (ANCEF) IVPB 2g/100 mL premix        2 g 200 mL/hr over 30 Minutes Intravenous Every 8 hours 11/17/20 2045 11/18/20 1038   11/17/20 0600  ceFAZolin (ANCEF) IVPB 3g/100 mL premix        3 g 200 mL/hr over 30 Minutes Intravenous On call to O.R. 11/16/20 1941 11/17/20 1901   11/11/20 1200  vancomycin (VANCOREADY) IVPB 500 mg/100 mL  Status:  Discontinued        500 mg 100 mL/hr over 60 Minutes Intravenous Every T-Th-Sa (Hemodialysis) 11/09/20 1414 11/11/20 1328   11/10/20 0000  Dolutegravir-lamiVUDine (DOVATO) 50-300 MG TABS        1 tablet Oral Daily 11/10/20 1048     11/10/20 0000  doravirine (PIFELTRO) 100 MG TABS tablet        100 mg Oral Daily 11/10/20 1048     11/08/20 1000  dolutegravir (TIVICAY) tablet 50 mg        50 mg Oral Daily 11/07/20 1451     11/08/20 1000  lamiVUDine (EPIVIR) tablet 300 mg        300 mg Oral Daily 11/07/20 1451     11/08/20 1000  doravirine (PIFELTRO) tablet 100 mg        100 mg Oral Daily 11/07/20 1451     11/02/20 1130  ceFAZolin (ANCEF) IVPB 2g/100 mL premix        2 g 200 mL/hr over 30 Minutes  Intravenous To Radiology 11/02/20 1042 11/02/20 1704   10/27/20 1245  ceFAZolin (ANCEF) IVPB 3g/100 mL premix        3 g 200 mL/hr over 30 Minutes Intravenous On call to O.R. 10/27/20 0755 10/27/20 1608   10/20/20 1415  linezolid (ZYVOX) tablet 600 mg  Status:  Discontinued        600 mg Oral Every 12 hours 10/20/20 1326 10/28/20 1115   10/19/20 1445  metroNIDAZOLE (FLAGYL) tablet 500 mg  Status:  Discontinued        500 mg Oral Every 12 hours 10/19/20 1347 10/28/20 1115   10/18/20 1600  ceFEPIme (MAXIPIME) 2 g in sodium chloride 0.9 % 100 mL IVPB  Status:  Discontinued        2 g 200 mL/hr over 30 Minutes Intravenous Every 12 hours 10/18/20 0414 10/28/20 1112   10/18/20 1000  dapsone tablet 100 mg        100 mg Oral Daily 10/18/20 0425     10/18/20 1000  bictegravir-emtricitabine-tenofovir AF (BIKTARVY) 50-200-25 MG per tablet 1 tablet  Status:  Discontinued        1 tablet Oral Daily 10/18/20 0610 11/07/20 1451   10/18/20 0415  metroNIDAZOLE (FLAGYL) IVPB 500 mg  Status:  Discontinued        500 mg 100 mL/hr over 60 Minutes Intravenous Every 8 hours  10/18/20 0404 10/19/20 1347   10/18/20 0415  ceFEPIme (MAXIPIME) 2 g in sodium chloride 0.9 % 100 mL IVPB        2 g 200 mL/hr over 30 Minutes Intravenous  Once 10/18/20 0414 10/18/20 0500   10/18/20 0345  vancomycin (VANCOREADY) IVPB 2000 mg/400 mL        2,000 mg 200 mL/hr over 120 Minutes Intravenous  Once 10/18/20 0335 10/18/20 0608       PRN meds: acetaminophen, alum & mag hydroxide-simeth, bisacodyl, diphenoxylate-atropine, guaiFENesin-dextromethorphan, hydrALAZINE, HYDROmorphone (DILAUDID) injection, labetalol, magnesium sulfate bolus IVPB, metoprolol tartrate, ondansetron, oxyCODONE, oxyCODONE, phenol, polyethylene glycol, potassium chloride   Objective: Vitals:   11/30/20 0840 11/30/20 0917  BP: 115/82 120/78  Pulse: 94 84  Resp: 18 18  Temp: 98.3 F (36.8 C) 98.6 F (37 C)  SpO2: 100% 99%    Intake/Output Summary  (Last 24 hours) at 11/30/2020 1139 Last data filed at 11/30/2020 1126 Gross per 24 hour  Intake 1947.17 ml  Output 1450 ml  Net 497.17 ml   Filed Weights   11/20/20 0618 11/23/20 0618 11/23/20 2144  Weight: 117.3 kg 121.4 kg 121.4 kg   Weight change:  Body mass index is 38.4 kg/m.   Physical Exam: General exam: Pleasant, middle-aged African-American male.  Not in physical distress. Skin: No rashes, lesions or ulcers. HEENT: Atraumatic, normocephalic, no obvious bleeding Lungs: Clear to auscultation bilaterally CVS: Regular rate and rhythm, no murmur GI/Abd soft, nontender, nondistended, bowel sound present CNS: Alert, awake, oriented x3 Psychiatry: Mood appropriate Extremities: Prior right BKA status. New left BKA with bandage around lower leg.  Not bleeding at this time.  Data Review: I have personally reviewed the laboratory data and studies available.  Recent Labs  Lab 11/26/20 0422 11/27/20 0345 11/27/20 1642 11/28/20 1630 11/29/20 0721 11/30/20 0457  WBC 13.4* 12.3*  --  9.3 11.1* 10.9*  NEUTROABS 10.1* 9.2*  --  6.8 8.3* 7.8*  HGB 6.8* 7.4* 7.7* 7.5* 7.4* 6.5*  HCT 20.6* 22.7* 23.4* 22.7* 22.4* 20.3*  MCV 90.7 91.2  --  90.1 90.7 92.3  PLT 476* 488*  --  532* 509* 475*   Recent Labs  Lab 11/26/20 0422 11/27/20 0345 11/28/20 0134 11/28/20 1630 11/29/20 0721 11/30/20 0457  NA 132* 132*  --  134* 133* 134*  K 4.2 4.5  --  4.7 4.7 4.6  CL 101 100  --  102 101 101  CO2 22 23  --  '24 25 22  '$ GLUCOSE 163* 132*  --  140* 127* 121*  BUN 24* 25*  --  27* 29* 29*  CREATININE 1.95* 2.05*  --  2.07* 2.38* 2.30*  CALCIUM 8.1* 8.6*  --  8.6* 8.6* 8.8*  MG 1.7 1.8 2.0  --  1.9 1.9  PHOS 3.0 3.5  --  3.9 3.9 3.9    F/u labs ordered Unresulted Labs (From admission, onward)     Start     Ordered   11/21/20 0500  CBC with Differential/Platelet  Daily,   R     Question:  Specimen collection method  Answer:  Lab=Lab collect   11/20/20 1010   11/21/20 0500  Magnesium   Daily,   R     Question:  Specimen collection method  Answer:  Lab=Lab collect   11/20/20 1010   11/02/20 0500  Renal function panel  Daily,   R     Question:  Specimen collection method  Answer:  Lab=Lab collect   11/01/20 1153  Signed, Terrilee Croak, MD [facility] 11/30/2020

## 2020-11-30 NOTE — Progress Notes (Signed)
[name], male   DOB: July 03, 1965, 55 y.o.   MRN: [name] Patient is seen in follow-up for revision transtibial amputation on the left.  Examination the incision no medial and central aspect the incision is completely healed.  There is full-thickness wound dehiscence laterally with a large hematoma within the wound.  There is no active bleeding, patient's serosanguineous drainage from the wound is from the existing blood clot.  Due to the progressive dehiscence we will plan for debridement of the residual limb and wound closure.    Plan for surgery tomorrow.  I will order an additional unit of blood for transfusion.

## 2020-11-30 NOTE — H&P (View-Only) (Signed)
[name], male   DOB: May 17, 1965, 55 y.o.   MRN: [name] Patient is seen in follow-up for revision transtibial amputation on the left.  Examination the incision no medial and central aspect the incision is completely healed.  There is full-thickness wound dehiscence laterally with a large hematoma within the wound.  There is no active bleeding, patient's serosanguineous drainage from the wound is from the existing blood clot.  Due to the progressive dehiscence we will plan for debridement of the residual limb and wound closure.    Plan for surgery tomorrow.  I will order an additional unit of blood for transfusion.

## 2020-12-01 DIAGNOSIS — N179 Acute kidney failure, unspecified: Secondary | ICD-10-CM | POA: Diagnosis not present

## 2020-12-01 LAB — RENAL FUNCTION PANEL
Albumin: 2.3 g/dL — ABNORMAL LOW (ref 3.5–5.0)
Anion gap: 8 (ref 5–15)
BUN: 27 mg/dL — ABNORMAL HIGH (ref 6–20)
CO2: 22 mmol/L (ref 22–32)
Calcium: 8.7 mg/dL — ABNORMAL LOW (ref 8.9–10.3)
Chloride: 103 mmol/L (ref 98–111)
Creatinine, Ser: 2.03 mg/dL — ABNORMAL HIGH (ref 0.61–1.24)
GFR, Estimated: 38 mL/min — ABNORMAL LOW (ref >60)
Glucose, Bld: 141 mg/dL — ABNORMAL HIGH (ref 70–99)
Phosphorus: 3.7 mg/dL (ref 2.5–4.6)
Potassium: 4.6 mmol/L (ref 3.5–5.1)
Sodium: 133 mmol/L — ABNORMAL LOW (ref 135–145)

## 2020-12-01 LAB — BPAM RBC
Blood Product Expiration Date: 202209242359
Blood Product Expiration Date: 202209262359
ISSUE DATE / TIME: 202208250853
ISSUE DATE / TIME: 202208251535
Unit Type and Rh: 5100
Unit Type and Rh: 5100

## 2020-12-01 LAB — TYPE AND SCREEN
ABO/RH(D): O POS
Antibody Screen: NEGATIVE
Unit division: 0
Unit division: 0

## 2020-12-01 LAB — SURGICAL PCR SCREEN
MRSA, PCR: POSITIVE — AB
Staphylococcus aureus: POSITIVE — AB

## 2020-12-01 LAB — GLUCOSE, CAPILLARY
Glucose-Capillary: 136 mg/dL — ABNORMAL HIGH (ref 70–99)
Glucose-Capillary: 145 mg/dL — ABNORMAL HIGH (ref 70–99)
Glucose-Capillary: 147 mg/dL — ABNORMAL HIGH (ref 70–99)
Glucose-Capillary: 129 mg/dL — ABNORMAL HIGH (ref 70–99)

## 2020-12-01 LAB — CBC WITH DIFFERENTIAL/PLATELET
Abs Immature Granulocytes: 0.1 10*3/uL — ABNORMAL HIGH (ref 0.00–0.07)
Basophils Absolute: 0 10*3/uL (ref 0.0–0.1)
Basophils Relative: 0 %
Eosinophils Absolute: 0.3 10*3/uL (ref 0.0–0.5)
Eosinophils Relative: 3 %
HCT: 26.8 % — ABNORMAL LOW (ref 39.0–52.0)
Hemoglobin: 8.8 g/dL — ABNORMAL LOW (ref 13.0–17.0)
Immature Granulocytes: 1 %
Lymphocytes Relative: 20 %
Lymphs Abs: 1.8 10*3/uL (ref 0.7–4.0)
MCH: 29.1 pg (ref 26.0–34.0)
MCHC: 32.8 g/dL (ref 30.0–36.0)
MCV: 88.7 fL (ref 80.0–100.0)
Monocytes Absolute: 0.7 10*3/uL (ref 0.1–1.0)
Monocytes Relative: 8 %
Neutro Abs: 6 10*3/uL (ref 1.7–7.7)
Neutrophils Relative %: 68 %
Platelets: 459 10*3/uL — ABNORMAL HIGH (ref 150–400)
RBC: 3.02 MIL/uL — ABNORMAL LOW (ref 4.22–5.81)
RDW: 14.7 % (ref 11.5–15.5)
WBC: 9 10*3/uL (ref 4.0–10.5)
nRBC: 0 % (ref 0.0–0.2)

## 2020-12-01 LAB — MAGNESIUM: Magnesium: 2 mg/dL (ref 1.7–2.4)

## 2020-12-01 MED ORDER — CHLORHEXIDINE GLUCONATE 4 % EX LIQD: 60.0000 mL | Freq: Once | CUTANEOUS | Status: DC

## 2020-12-01 MED ORDER — MUPIROCIN 2 % EX OINT
1.0000 "application " | TOPICAL_OINTMENT | Freq: Two times a day (BID) | CUTANEOUS | Status: AC
  Administered 2020-12-01 – 2020-12-05 (×10): 1 via NASAL
  Filled 2020-12-01 (×3): qty 22

## 2020-12-01 MED ORDER — POVIDONE-IODINE 10 % EX SWAB
2.0000 "application " | Freq: Once | CUTANEOUS | Status: AC
  Administered 2020-12-02: 2 via TOPICAL

## 2020-12-01 MED ORDER — CEFAZOLIN IN SODIUM CHLORIDE 3-0.9 GM/100ML-% IV SOLN
3.0000 g | INTRAVENOUS | Status: AC
  Administered 2020-12-02: 3 g via INTRAVENOUS
  Filled 2020-12-01: qty 100

## 2020-12-01 MED ORDER — CHLORHEXIDINE GLUCONATE CLOTH 2 % EX PADS
6.0000 | MEDICATED_PAD | Freq: Every day | CUTANEOUS | Status: AC
  Administered 2020-12-02 – 2020-12-06 (×5): 6 via TOPICAL

## 2020-12-01 NOTE — Progress Notes (Addendum)
Per notes patient was scheduled for surgery today, however patient did not have NPO order/MRSA PCR. MD paged and orders placed however patient had already received a tray and eaten some of his breakfast.  Edit: Kitchen was not aware of NPO status and sent a breakfast tray because order appears under nursing orders. Nursing director is aware of this.

## 2020-12-01 NOTE — Interval H&P Note (Signed)
History and Physical Interval Note:  12/01/2020 6:49 AM  Keith [name]  has presented today for surgery, with the diagnosis of Dehiscence Left Below Knee Amputation.  The various methods of treatment have been discussed with the patient and family. After consideration of risks, benefits and other options for treatment, the patient has consented to  Procedure(s): REVISION LEFT BELOW KNEE AMPUTATION (Left) as a surgical intervention.  The patient's history has been reviewed, patient examined, no change in status, stable for surgery.  I have reviewed the patient's chart and labs.  Questions were answered to the patient's satisfaction.     [name]

## 2020-12-01 NOTE — Progress Notes (Signed)
Pt transferred to 5N29 at 2015,vvs skin intact,first shift nurse gave report to first shift nurse on 5N.pt personal belongings by side 5N nt and 5N RN received pt.

## 2020-12-01 NOTE — Progress Notes (Addendum)
[name], male   DOB: March 19, 1966, 55 y.o.   MRN: [name] Patient ate breakfast.  Will need to reschedule surgery.  Surgery rescheduled for Saturday morning.

## 2020-12-01 NOTE — Progress Notes (Signed)
PROGRESS NOTE  Keith [name]  DOB: 06-13-1965  [name], NP [id number]  DOA: 10/17/2020  LOS: 41 days  Hospital Day: 46   Chief Complaint  Patient presents with   Foot Ulcer    Brief narrative: Keith [name] is a 55 y.o. male with PMH significant for DM2, HTN, HLD, CAD, CKD 3B, HIV, right BKA status.  Patient was sent to the ED on 7/12 by his orthopedic surgeon due to concern of left heel ulcer that failed outpatient therapy.   Subsequently, MRI confirmed osteomyelitis.  He underwent left transtibial amputation by [name].  During the hospitalization, he had worsening renal function.  Nephrology was consulted.  He had temporary dialysis catheter placed and dialysis was started.  Later, he developed hematuria and required fulguration and CBI along with blood transfusions.  Renal function improved; dialysis was stopped and nephrology signed off.  Subsequently, his wound dehisced with bleeding requiring blood transfusion.   He underwent left below-knee amputation revision by [name] on 11/17/2020.  Subjective: Patient was seen and examined this morning. Lying on bed.  Not in distress.  No further bleeding from the hematoma site. Labs this morning with improving creatinine. Noted a plan from [name] for revision of his left BK amputation.  Wound dehiscence.  Assessment/Plan: Acute kidney injury with metabolic acidosis > Improved. -Patient presented with worsening renal functions and started on dialysis during this admission but was then subsequently stopped and nephrology signed off.  AKI was felt to be due to ATN.  Tunneled dialysis catheter has already been removed. -Renal ultrasound was negative for hydronephrosis. -Creatinine gradually improving, 2.03 today.  Continue IV fluid. Recent Labs    11/22/20 0511 11/23/20 0239 11/24/20 0422 11/25/20 0734 11/26/20 0422 11/27/20 0345 11/28/20 1630 11/29/20 0721 11/30/20 0457 12/01/20 0232  BUN '16 17  18 16  '$ 23* 24* 25* 27* 29* 29* 27*  CREATININE 1.79* 1.86*  1.75* 1.83* 1.97* 1.95* 2.05* 2.07* 2.38* 2.30* 2.03*  CO2 '23 23  23 26 '$ 21* '22 23 24 25 22 22    '$ Left heel osteomyelitis Left BKA after failed transtibial amputation on 10/27/20 with subsequent dehiscence of wound with bleeding Bilateral BKA status -Patient has had significant intermittent bleeding from the wound with wound dehiscence requiring blood transfusions. -Noted a plan for likely due to revision of left BKA on 8/27.  Hematuria Acute urinary retention -11/08/2020 -Foley catheter placement with drainage of clots.  Urology was consulted  -8/4 - underwent cystoscopy with fulguration and started on CBI and Proscar.  Subsequently CBI was discontinued. -8/15-Foley catheter discontinued. -8/22-patient had another episode of hematuria and clots. Urology recommended Proscar, Uroxatrol and IV hydration. Hematuria is improving now.   Anemia due to acute blood loss from bladder and left stump -Hemoglobin has been running low mostly between 7 and 8.  It is at 8.8 today.  6 units of PRBC transfused so far this admission.  -Continue to monitor hemoglobin. Recent Labs    04/26/20 1228 04/27/20 0802 11/29/20 0721 11/30/20 0457 11/30/20 1500 11/30/20 2002 12/01/20 0232  HGB 10.7*   < > 7.4* 6.5* 7.2* 7.9* 8.8*  MCV 84.5   < > 90.7 92.3  --   --  88.7  VITAMINB12 451  --   --   --   --   --   --   FOLATE 8.7  --   --   --   --   --   --   FERRITIN 347*  --   --   --   --   --   --  TIBC 276  --   --   --   --   --   --   IRON 71  --   --   --   --   --   --   RETICCTPCT 1.8  --   --   --   --   --   --    < > = values in this interval not displayed.    Uncontrolled type 2 diabetes mellitus Hyperglycemia -A1c was 13.9 on May 2022.  Repeat A1c on 8/24 is at 5.9.  Wonder if it is truly low because of diet control in the hospital or spuriously low due to frequent blood loss and multiple transfusions.   -Currently blood sugar level is  controlled on Jardiance 10 mg daily and sliding scale insulin Recent Labs  Lab 11/30/20 1145 11/30/20 1631 11/30/20 2206 12/01/20 0741 12/01/20 1141  GLUCAP 161* 154* 140* 136* 145*      CAD status post stenting in 03/2020 -Continue aspirin.  Because of recurrent bleeding, Brilinta has been discontinued for now since 11/19/2020 as per cardiology recommendations.   -Once bleeding stabilizes, will resume Brilinta.  Continue Vascepa and Lipitor  Hyperkalemia Resolved.  HIV Continue current regimen of antiretrovirals and dapsone as per ID.   Outpatient follow-up with ID   Mobility: PT eval obtained.  SNF recommended Code Status:   Code Status: Full Code  Nutritional status: Body mass index is 38.4 kg/m.     Diet:  Diet Order             Diet NPO time specified  Diet effective ____           Diet Carb Modified Fluid consistency: Thin; Room service appropriate? Yes  Diet effective now                  DVT prophylaxis: Not on heparin products because of frequent bleeding.  Bilateral BKA status, unable to use SCDs  Antimicrobials: None Fluid: Normal saline at 75 mill per hour Consultants: Vascular surgery Family Communication: None at bedside  Status is: Inpatient  Remains inpatient appropriate because: Needs revision of amputation  Dispo: The patient is from: Home              Anticipated d/c is to: SNF              Patient currently is not medically stable to d/c.   Difficult to place patient No     Infusions:   sodium chloride 75 mL/hr at 12/01/20 0804    ceFAZolin (ANCEF) IV     magnesium sulfate bolus IVPB      Scheduled Meds:  alfuzosin  10 mg Oral Q breakfast   allopurinol  100 mg Oral Daily   vitamin C  1,000 mg Oral Daily   aspirin EC  81 mg Oral Daily   atorvastatin  80 mg Oral Daily   dapsone  100 mg Oral Daily   docusate sodium  100 mg Oral Daily   dolutegravir  50 mg Oral Daily   doravirine  100 mg Oral Daily   empagliflozin  10 mg  Oral Daily   escitalopram  20 mg Oral Daily   finasteride  5 mg Oral Daily   gabapentin  100 mg Oral TID   icosapent Ethyl  2 g Oral BID   insulin aspart  0-5 Units Subcutaneous QHS   lamiVUDine  300 mg Oral Daily   mupirocin ointment  1 application Nasal BID  nutrition supplement (JUVEN)  1 packet Oral BID BM   oxybutynin  2.5 mg Oral BID   oxyCODONE  10 mg Oral QID   pantoprazole  40 mg Oral Daily   senna  2 tablet Oral QHS    Antimicrobials: Anti-infectives (From admission, onward)    Start     Dose/Rate Route Frequency Ordered Stop   12/01/20 0600  ceFAZolin (ANCEF) IVPB 2g/100 mL premix  Status:  Discontinued        2 g 200 mL/hr over 30 Minutes Intravenous On call to O.R. 11/30/20 1817 11/30/20 1819   12/01/20 0600  ceFAZolin (ANCEF) IVPB 3g/100 mL premix        3 g 200 mL/hr over 30 Minutes Intravenous On call to O.R. 11/30/20 1820 12/02/20 0559   11/18/20 0100  ceFAZolin (ANCEF) IVPB 2g/100 mL premix        2 g 200 mL/hr over 30 Minutes Intravenous Every 8 hours 11/17/20 2045 11/18/20 1038   11/17/20 0600  ceFAZolin (ANCEF) IVPB 3g/100 mL premix        3 g 200 mL/hr over 30 Minutes Intravenous On call to O.R. 11/16/20 1941 11/17/20 1901   11/11/20 1200  vancomycin (VANCOREADY) IVPB 500 mg/100 mL  Status:  Discontinued        500 mg 100 mL/hr over 60 Minutes Intravenous Every T-Th-Sa (Hemodialysis) 11/09/20 1414 11/11/20 1328   11/10/20 0000  Dolutegravir-lamiVUDine (DOVATO) 50-300 MG TABS        1 tablet Oral Daily 11/10/20 1048     11/10/20 0000  doravirine (PIFELTRO) 100 MG TABS tablet        100 mg Oral Daily 11/10/20 1048     11/08/20 1000  dolutegravir (TIVICAY) tablet 50 mg        50 mg Oral Daily 11/07/20 1451     11/08/20 1000  lamiVUDine (EPIVIR) tablet 300 mg        300 mg Oral Daily 11/07/20 1451     11/08/20 1000  doravirine (PIFELTRO) tablet 100 mg        100 mg Oral Daily 11/07/20 1451     11/02/20 1130  ceFAZolin (ANCEF) IVPB 2g/100 mL premix         2 g 200 mL/hr over 30 Minutes Intravenous To Radiology 11/02/20 1042 11/02/20 1704   10/27/20 1245  ceFAZolin (ANCEF) IVPB 3g/100 mL premix        3 g 200 mL/hr over 30 Minutes Intravenous On call to O.R. 10/27/20 0755 10/27/20 1608   10/20/20 1415  linezolid (ZYVOX) tablet 600 mg  Status:  Discontinued        600 mg Oral Every 12 hours 10/20/20 1326 10/28/20 1115   10/19/20 1445  metroNIDAZOLE (FLAGYL) tablet 500 mg  Status:  Discontinued        500 mg Oral Every 12 hours 10/19/20 1347 10/28/20 1115   10/18/20 1600  ceFEPIme (MAXIPIME) 2 g in sodium chloride 0.9 % 100 mL IVPB  Status:  Discontinued        2 g 200 mL/hr over 30 Minutes Intravenous Every 12 hours 10/18/20 0414 10/28/20 1112   10/18/20 1000  dapsone tablet 100 mg        100 mg Oral Daily 10/18/20 0425     10/18/20 1000  bictegravir-emtricitabine-tenofovir AF (BIKTARVY) 50-200-25 MG per tablet 1 tablet  Status:  Discontinued        1 tablet Oral Daily 10/18/20 0610 11/07/20 1451   10/18/20 0415  metroNIDAZOLE (FLAGYL)  IVPB 500 mg  Status:  Discontinued        500 mg 100 mL/hr over 60 Minutes Intravenous Every 8 hours 10/18/20 0404 10/19/20 1347   10/18/20 0415  ceFEPIme (MAXIPIME) 2 g in sodium chloride 0.9 % 100 mL IVPB        2 g 200 mL/hr over 30 Minutes Intravenous  Once 10/18/20 0414 10/18/20 0500   10/18/20 0345  vancomycin (VANCOREADY) IVPB 2000 mg/400 mL        2,000 mg 200 mL/hr over 120 Minutes Intravenous  Once 10/18/20 0335 10/18/20 0608       PRN meds: acetaminophen, alum & mag hydroxide-simeth, bisacodyl, diphenoxylate-atropine, guaiFENesin-dextromethorphan, hydrALAZINE, HYDROmorphone (DILAUDID) injection, labetalol, magnesium sulfate bolus IVPB, metoprolol tartrate, ondansetron, oxyCODONE, oxyCODONE, phenol, polyethylene glycol, potassium chloride   Objective: Vitals:   12/01/20 0738 12/01/20 1146  BP: (!) 146/93 (!) 149/117  Pulse: 92 (!) 106  Resp: 16 19  Temp: 98 F (36.7 C) 98.5 F (36.9 C)   SpO2: 100% 100%    Intake/Output Summary (Last 24 hours) at 12/01/2020 1445 Last data filed at 12/01/2020 0804 Gross per 24 hour  Intake 793.61 ml  Output 2051 ml  Net -1257.39 ml    Filed Weights   11/20/20 0618 11/23/20 0618 11/23/20 2144  Weight: 117.3 kg 121.4 kg 121.4 kg   Weight change:  Body mass index is 38.4 kg/m.   Physical Exam: General exam: Pleasant, middle-aged African-American male.  Not in physical distress Skin: No rashes, lesions or ulcers. HEENT: Atraumatic, normocephalic, no obvious bleeding Lungs: Clear to auscultation bilaterally CVS: Regular rate and rhythm, no murmur GI/Abd soft, nontender, nondistended, bowel sound present CNS: Alert, awake, oriented x3 Psychiatry: Mood appropriate Extremities: Prior right BKA status. New left BKA with bandage around lower leg.  Not bleeding at this time.  Data Review: I have personally reviewed the laboratory data and studies available.  Recent Labs  Lab 11/27/20 0345 11/27/20 1642 11/28/20 1630 11/29/20 0721 11/30/20 0457 11/30/20 1500 11/30/20 2002 12/01/20 0232  WBC 12.3*  --  9.3 11.1* 10.9*  --   --  9.0  NEUTROABS 9.2*  --  6.8 8.3* 7.8*  --   --  6.0  HGB 7.4*   < > 7.5* 7.4* 6.5* 7.2* 7.9* 8.8*  HCT 22.7*   < > 22.7* 22.4* 20.3* 22.1* 24.3* 26.8*  MCV 91.2  --  90.1 90.7 92.3  --   --  88.7  PLT 488*  --  532* 509* 475*  --   --  459*   < > = values in this interval not displayed.    Recent Labs  Lab 11/27/20 0345 11/28/20 0134 11/28/20 1630 11/29/20 0721 11/30/20 0457 12/01/20 0232  NA 132*  --  134* 133* 134* 133*  K 4.5  --  4.7 4.7 4.6 4.6  CL 100  --  102 101 101 103  CO2 23  --  '24 25 22 22  '$ GLUCOSE 132*  --  140* 127* 121* 141*  BUN 25*  --  27* 29* 29* 27*  CREATININE 2.05*  --  2.07* 2.38* 2.30* 2.03*  CALCIUM 8.6*  --  8.6* 8.6* 8.8* 8.7*  MG 1.8 2.0  --  1.9 1.9 2.0  PHOS 3.5  --  3.9 3.9 3.9 3.7     F/u labs ordered Unresulted Labs (From admission, onward)      Start     Ordered   11/21/20 0500  CBC with Differential/Platelet  Daily,  R     Question:  Specimen collection method  Answer:  Lab=Lab collect   11/20/20 1010   11/21/20 0500  Magnesium  Daily,   R     Question:  Specimen collection method  Answer:  Lab=Lab collect   11/20/20 1010   11/02/20 0500  Renal function panel  Daily,   R     Question:  Specimen collection method  Answer:  Lab=Lab collect   11/01/20 1153            Signed, Terrilee Croak, MD [facility] 12/01/2020

## 2020-12-01 NOTE — Progress Notes (Signed)
Occupational Therapy Treatment Patient Details Name: Keith [name] MRN: [name] DOB: December 01, 1965 Today's Date: 12/01/2020    History of present illness 55 y.o. male  who is sent to the ED by his orthopedic surgery provider due to concern for left heel ulcer that had failed outpatient therapy. with medical history significant for HIV, CKD 3B, type 2 diabetes, hypertension, hyperlipidemia, coronary artery disease, status post right below the knee amputation, now s/p L BKA 10/27/20. s/p REVISION L AMPUTATION BELOW KNEE on 8/12.   OT comments  Pt making progress with OT goals this session. Pt working on bed mobility and lateral scoots to improve UE strength for scoot transfers to a WC or BSC. He tolerated scooting the length of the bed x2 with min guard for safety. Pt requiring max encouragement to participate this session. OT will continue to follow acutely.    Follow Up Recommendations  SNF    Equipment Recommendations  None recommended by OT    Recommendations for Other Services      Precautions / Restrictions Precautions Precautions: Fall Precaution Comments: B BKA's, RLE prosthesis Required Braces or Orthoses: Other Brace Other Brace: limb protector on left Restrictions Weight Bearing Restrictions: Yes RLE Weight Bearing: Weight bearing as tolerated LLE Weight Bearing: Non weight bearing       Mobility Bed Mobility Overal bed mobility: Needs Assistance Bed Mobility: Supine to Sit     Supine to sit: Modified independent (Device/Increase time);HOB elevated     General bed mobility comments: Pt refusing to practice bed moblity from flat position, stating it bothers his back too much. Mod I from elevated surface    Transfers Overall transfer level: Needs assistance Equipment used: Rolling walker (2 wheeled) Transfers: Lateral/Scoot Transfers Sit to Stand: Mod assist;From elevated surface Stand pivot transfers: Mod assist      Lateral/Scoot Transfers: Min  guard General transfer comment: Pt scooted the length of the bed x2, using rails to assist    Balance Overall balance assessment: Needs assistance Sitting-balance support: No upper extremity supported;Feet supported Sitting balance-Leahy Scale: Good     Standing balance support: Bilateral upper extremity supported Standing balance-Leahy Scale: Poor Standing balance comment: min-modA with BUE support of RW                           ADL either performed or assessed with clinical judgement   ADL Overall ADL's : Needs assistance/impaired                                       General ADL Comments: Pt refusing all ADL's this session, focused on transfers and bed mobility     Vision       Perception     Praxis      Cognition Arousal/Alertness: Awake/alert Behavior During Therapy: WFL for tasks assessed/performed Overall Cognitive Status: Impaired/Different from baseline Area of Impairment: Awareness;Safety/judgement;Following commands;Problem solving                       Following Commands: Follows one step commands inconsistently;Follows one step commands with increased time Safety/Judgement: Decreased awareness of safety;Decreased awareness of deficits Awareness: Emergent Problem Solving: Slow processing;Difficulty sequencing;Requires verbal cues General Comments: Pt with poor problem-solving on how to safely transfer between surfaces, needing extensive cues and asisstance to rearrange furniture to ensure safety.  Exercises Exercises: General Lower Extremity General Exercises - Lower Extremity Gluteal Sets: Left;10 reps;Seated Long Arc Quad: Left;Other reps (comment);Seated;AROM (x40) Heel Slides: AROM;Left;Other reps (comment);Seated (x40 reps) Hip ABduction/ADduction: AROM;Left;10 reps;Seated Hip Flexion/Marching: AROM;Left;10 reps;Seated Other Exercises Other Exercises: educated pt on positioning L residual limb and  performing quad sets to improve knee extension   Shoulder Instructions       General Comments      Pertinent Vitals/ Pain       Pain Assessment: Faces Faces Pain Scale: Hurts little more Pain Location: L residual limb Pain Descriptors / Indicators: Guarding;Grimacing;Operative site guarding Pain Intervention(s): Limited activity within patient's tolerance;Monitored during session  Home Living                                          Prior Functioning/Environment              Frequency  Min 2X/week        Progress Toward Goals  OT Goals(current goals can now be found in the care plan section)  Progress towards OT goals: Progressing toward goals  Acute Rehab OT Goals Patient Stated Goal: to get stronger OT Goal Formulation: With patient Time For Goal Achievement: 12/07/20 Potential to Achieve Goals: Good ADL Goals Pt Will Perform Lower Body Bathing: with mod assist;sit to/from stand Pt Will Perform Upper Body Dressing: with set-up;sitting Pt Will Perform Lower Body Dressing: sit to/from stand;with mod assist Pt Will Transfer to Toilet: with min assist;stand pivot transfer Pt Will Perform Toileting - Clothing Manipulation and hygiene: with mod assist;sit to/from stand  Plan Discharge plan remains appropriate;Frequency remains appropriate    Co-evaluation                 AM-PAC OT "6 Clicks" Daily Activity     Outcome Measure   Help from another person eating meals?: None Help from another person taking care of personal grooming?: A Little Help from another person toileting, which includes using toliet, bedpan, or urinal?: A Lot Help from another person bathing (including washing, rinsing, drying)?: A Lot Help from another person to put on and taking off regular upper body clothing?: A Little Help from another person to put on and taking off regular lower body clothing?: A Lot 6 Click Score: 16    End of Session    OT Visit  Diagnosis: Other abnormalities of gait and mobility (R26.89);Muscle weakness (generalized) (M62.81);Pain Pain - Right/Left: Left Pain - part of body: Leg   Activity Tolerance Patient tolerated treatment well   Patient Left in bed;with call bell/phone within reach   Nurse Communication Mobility status        Time: 1250-1306 OT Time Calculation (min): 16 min  Charges: OT General Charges $OT Visit: 1 Visit OT Treatments $Therapeutic Activity: 8-22 mins  Vyolet Sakuma H., OTR/L Acute Rehabilitation  Mylo Driskill Elane Yolanda Bonine 12/01/2020, 2:58 PM

## 2020-12-01 NOTE — Progress Notes (Signed)
Physical Therapy Treatment Patient Details Name: Keith [name] MRN: [name] DOB: October 16, 1965 Today's Date: 12/01/2020    History of Present Illness 55 y.o. male  who is sent to the ED by his orthopedic surgery provider due to concern for left heel ulcer that had failed outpatient therapy. with medical history significant for HIV, CKD 3B, type 2 diabetes, hypertension, hyperlipidemia, coronary artery disease, status post right below the knee amputation, now s/p L BKA 10/27/20. s/p REVISION L AMPUTATION BELOW KNEE on 8/12.    PT Comments    Focused session on transfer and w/c mobility training along with L lower extremity exercises. Pt needs modA to stand pivot to his R using a RW and was unable to hop today. Pt also with poor w/c management, needing assistance in tight spaces to avoid obstacles. In addition, pt displays poor propulsion techniques, even when cued to correct. Educated pt on positioning and exercises to strengthen his residual limb. Will continue to follow acutely. Current recommendations remain appropriate.    Follow Up Recommendations  SNF     Equipment Recommendations  Wheelchair (measurements PT);Wheelchair cushion (measurements PT)    Recommendations for Other Services       Precautions / Restrictions Precautions Precautions: Fall Precaution Comments: B BKA's, RLE prosthesis Required Braces or Orthoses: Other Brace Other Brace: limb protector on left Restrictions Weight Bearing Restrictions: Yes RLE Weight Bearing: Weight bearing as tolerated LLE Weight Bearing: Non weight bearing    Mobility  Bed Mobility               General bed mobility comments: Pt sitting up EOB upon arrival after OT left.    Transfers Overall transfer level: Needs assistance Equipment used: Rolling walker (2 wheeled) Transfers: Sit to/from [facility];Lateral/Scoot Transfers Sit to Stand: Mod assist;From elevated surface Stand pivot transfers: Mod assist       Lateral/Scoot Transfers: Min assist General transfer comment: Elevated EOB and cued pt to stand pivot towards R instead of his attempt to do so to L. Improved success going to R noted. ModA to steady with power up to stand and pivot to R, controlling descent to w/c. MinA to scoot laterally to R w/c > recliner.  Ambulation/Gait             General Gait Details: Unable to hop today.   Stairs             Information systems manager mobility: Yes Wheelchair propulsion: Both upper extremities Wheelchair parts: Needs assistance Distance: 80 (x3 bouts of ~80 ft > ~30 ft > ~40 ft) Wheelchair Assistance Details (indicate cue type and reason): Educated pt on proper propulsion patterns and to use bil UEs simultaneously to improve efficiency of each push, but pt continued to alternate UE use. Pt with difficulty negotiating in tight spaces or around obstacles, somewhat resistive to cues and asisstance. Needs minA intermittently to avoid hitting obstacles.  Modified Rankin (Stroke Patients Only)       Balance Overall balance assessment: Needs assistance Sitting-balance support: No upper extremity supported;Feet supported Sitting balance-Leahy Scale: Good     Standing balance support: Bilateral upper extremity supported Standing balance-Leahy Scale: Poor Standing balance comment: min-modA with BUE support of RW                            Cognition Arousal/Alertness: Awake/alert Behavior During Therapy: WFL for tasks assessed/performed Overall Cognitive Status: Impaired/Different from baseline Area of Impairment: Awareness;Safety/judgement;Following  commands;Problem solving                       Following Commands: Follows one step commands inconsistently;Follows one step commands with increased time Safety/Judgement: Decreased awareness of safety;Decreased awareness of deficits Awareness: Emergent Problem Solving: Slow  processing;Difficulty sequencing;Requires verbal cues General Comments: Pt with poor problem-solving on how to safely transfer between surfaces, needing extensive cues and asisstance to rearrange furniture to ensure safety.      Exercises General Exercises - Lower Extremity Gluteal Sets: Left;10 reps;Seated Long Arc Quad: Left;Other reps (comment);Seated;AROM (x40) Heel Slides: AROM;Left;Other reps (comment);Seated (x40 reps) Hip ABduction/ADduction: AROM;Left;10 reps;Seated Hip Flexion/Marching: AROM;Left;10 reps;Seated Other Exercises Other Exercises: educated pt on positioning L residual limb and performing quad sets to improve knee extension    General Comments        Pertinent Vitals/Pain Pain Assessment: Faces Faces Pain Scale: Hurts little more Pain Location: L residual limb Pain Descriptors / Indicators: Guarding;Grimacing;Operative site guarding Pain Intervention(s): Limited activity within patient's tolerance;Monitored during session;Repositioned    Home Living                      Prior Function            PT Goals (current goals can now be found in the care plan section) Acute Rehab PT Goals Patient Stated Goal: to get into w/c to go around unit PT Goal Formulation: With patient Time For Goal Achievement: 12/07/20 Potential to Achieve Goals: Fair Progress towards PT goals: Progressing toward goals    Frequency    Min 3X/week      PT Plan Current plan remains appropriate    Co-evaluation              AM-PAC PT "6 Clicks" Mobility   Outcome Measure  Help needed turning from your back to your side while in a flat bed without using bedrails?: None Help needed moving from lying on your back to sitting on the side of a flat bed without using bedrails?: A Little Help needed moving to and from a bed to a chair (including a wheelchair)?: A Lot Help needed standing up from a chair using your arms (e.g., wheelchair or bedside chair)?: A  Lot Help needed to walk in hospital room?: Total Help needed climbing 3-5 steps with a railing? : Total 6 Click Score: 13    End of Session Equipment Utilized During Treatment: Gait belt;Other (comment) (R prosthesis) Activity Tolerance: Patient tolerated treatment well Patient left: with call bell/phone within reach;in chair;with chair alarm set   PT Visit Diagnosis: Unsteadiness on feet (R26.81);Other abnormalities of gait and mobility (R26.89);Pain;Muscle weakness (generalized) (M62.81);Difficulty in walking, not elsewhere classified (R26.2) Pain - Right/Left: Left Pain - part of body: Leg     Time: [id number] PT Time Calculation (min) (ACUTE ONLY): 37 min  Charges:  $Therapeutic Exercise: 8-22 mins $Therapeutic Activity: 8-22 mins                     Moishe Spice, PT, DPT Acute Rehabilitation Services  Pager: [phone] Office: [place] 12/01/2020, 2:26 PM

## 2020-12-01 NOTE — H&P (View-Only) (Signed)
[name], male   DOB: [birth year], 55 y.o.   MRN: [id number] Patient ate breakfast.  Will need to reschedule surgery.  Surgery rescheduled for Saturday morning.

## 2020-12-01 NOTE — Progress Notes (Signed)
Pt transferred to unit . A&OX4, BP (!) 138/98   Pulse 79   Temp 98.5 F (36.9 C)   Resp 18   Ht '5\' 10"'$  (1.778 m)   Wt 121.4 kg   SpO2 100%   BMI 38.40 kg/m  prn dilaudid given with relief.  Pt oriented to floor no further needs voiced. Skin intact, call bell nearby, bed alarm on, bed in lowest position with 2/4 rails up. Hal Neer  12/01/20

## 2020-12-02 ENCOUNTER — Encounter (HOSPITAL_COMMUNITY)
Admission: EM | Disposition: A | Discharge status: Home Health | Payer: Self-pay | Source: Home / Self Care | Attending: Internal Medicine

## 2020-12-02 ENCOUNTER — Encounter (HOSPITAL_COMMUNITY): Payer: Self-pay | Admitting: Internal Medicine

## 2020-12-02 ENCOUNTER — Inpatient Hospital Stay (HOSPITAL_COMMUNITY): Payer: Medicare HMO | Admitting: Anesthesiology

## 2020-12-02 DIAGNOSIS — Z89512 Acquired absence of left leg below knee: Secondary | ICD-10-CM | POA: Diagnosis not present

## 2020-12-02 DIAGNOSIS — T8781 Dehiscence of amputation stump: Secondary | ICD-10-CM | POA: Diagnosis not present

## 2020-12-02 DIAGNOSIS — N179 Acute kidney failure, unspecified: Secondary | ICD-10-CM | POA: Diagnosis not present

## 2020-12-02 HISTORY — PX: STUMP REVISION: SHX6102

## 2020-12-02 LAB — RENAL FUNCTION PANEL
Albumin: 2.3 g/dL — ABNORMAL LOW (ref 3.5–5.0)
Anion gap: 7 (ref 5–15)
BUN: 24 mg/dL — ABNORMAL HIGH (ref 6–20)
CO2: 23 mmol/L (ref 22–32)
Calcium: 8.7 mg/dL — ABNORMAL LOW (ref 8.9–10.3)
Chloride: 104 mmol/L (ref 98–111)
Creatinine, Ser: 2.04 mg/dL — ABNORMAL HIGH (ref 0.61–1.24)
GFR, Estimated: 38 mL/min — ABNORMAL LOW (ref >60)
Glucose, Bld: 122 mg/dL — ABNORMAL HIGH (ref 70–99)
Phosphorus: 4.1 mg/dL (ref 2.5–4.6)
Potassium: 5.1 mmol/L (ref 3.5–5.1)
Sodium: 134 mmol/L — ABNORMAL LOW (ref 135–145)

## 2020-12-02 LAB — CBC WITH DIFFERENTIAL/PLATELET
Abs Immature Granulocytes: 0.11 10*3/uL — ABNORMAL HIGH (ref 0.00–0.07)
Basophils Absolute: 0.1 10*3/uL (ref 0.0–0.1)
Basophils Relative: 1 %
Eosinophils Absolute: 0.3 10*3/uL (ref 0.0–0.5)
Eosinophils Relative: 3 %
HCT: 24.7 % — ABNORMAL LOW (ref 39.0–52.0)
Hemoglobin: 8.1 g/dL — ABNORMAL LOW (ref 13.0–17.0)
Immature Granulocytes: 1 %
Lymphocytes Relative: 14 %
Lymphs Abs: 1.4 10*3/uL (ref 0.7–4.0)
MCH: 29.6 pg (ref 26.0–34.0)
MCHC: 32.8 g/dL (ref 30.0–36.0)
MCV: 90.1 fL (ref 80.0–100.0)
Monocytes Absolute: 0.7 10*3/uL (ref 0.1–1.0)
Monocytes Relative: 7 %
Neutro Abs: 7.4 10*3/uL (ref 1.7–7.7)
Neutrophils Relative %: 74 %
Platelets: 444 10*3/uL — ABNORMAL HIGH (ref 150–400)
RBC: 2.74 MIL/uL — ABNORMAL LOW (ref 4.22–5.81)
RDW: 14.7 % (ref 11.5–15.5)
WBC: 10 10*3/uL (ref 4.0–10.5)
nRBC: 0 % (ref 0.0–0.2)

## 2020-12-02 LAB — GLUCOSE, CAPILLARY
Glucose-Capillary: 105 mg/dL — ABNORMAL HIGH (ref 70–99)
Glucose-Capillary: 124 mg/dL — ABNORMAL HIGH (ref 70–99)
Glucose-Capillary: 130 mg/dL — ABNORMAL HIGH (ref 70–99)
Glucose-Capillary: 266 mg/dL — ABNORMAL HIGH (ref 70–99)
Glucose-Capillary: 228 mg/dL — ABNORMAL HIGH (ref 70–99)

## 2020-12-02 LAB — MAGNESIUM: Magnesium: 1.9 mg/dL (ref 1.7–2.4)

## 2020-12-02 SURGERY — REVISION, AMPUTATION SITE
Anesthesia: General | Site: Leg Lower | Laterality: Left

## 2020-12-02 MED ORDER — ZINC SULFATE 220 (50 ZN) MG PO CAPS
220.0000 mg | ORAL_CAPSULE | Freq: Every day | ORAL | Status: AC
Start: 2020-12-02 — End: 2020-12-15
  Administered 2020-12-02 – 2020-12-15 (×14): 220 mg via ORAL
  Filled 2020-12-02 (×14): qty 1

## 2020-12-02 MED ORDER — 0.9 % SODIUM CHLORIDE (POUR BTL) OPTIME
TOPICAL | Status: DC | PRN
  Administered 2020-12-02: 1000 mL

## 2020-12-02 MED ORDER — OXYCODONE HCL 5 MG PO TABS: 5.0000 mg | ORAL_TABLET | ORAL | Status: DC | PRN

## 2020-12-02 MED ORDER — FENTANYL CITRATE (PF) 250 MCG/5ML IJ SOLN
INTRAMUSCULAR | Status: DC | PRN
  Administered 2020-12-02 (×2): 25 ug via INTRAVENOUS

## 2020-12-02 MED ORDER — CHLORHEXIDINE GLUCONATE 0.12 % MT SOLN: 15.0000 mL | Freq: Once | OROMUCOSAL | Status: AC

## 2020-12-02 MED ORDER — SODIUM CHLORIDE 0.9 % IV SOLN: INTRAVENOUS | Status: DC

## 2020-12-02 MED ORDER — PHENOL 1.4 % MT LIQD: 1.0000 | OROMUCOSAL | Status: DC | PRN

## 2020-12-02 MED ORDER — PROMETHAZINE HCL 25 MG/ML IJ SOLN: 6.2500 mg | INTRAMUSCULAR | Status: DC | PRN

## 2020-12-02 MED ORDER — ONDANSETRON HCL 4 MG/2ML IJ SOLN
INTRAMUSCULAR | Status: AC
  Filled 2020-12-02: qty 2

## 2020-12-02 MED ORDER — FENTANYL CITRATE (PF) 100 MCG/2ML IJ SOLN
INTRAMUSCULAR | Status: AC
  Administered 2020-12-02: 50 ug via INTRAVENOUS
  Filled 2020-12-02: qty 2

## 2020-12-02 MED ORDER — BISACODYL 5 MG PO TBEC: 5.0000 mg | DELAYED_RELEASE_TABLET | Freq: Every day | ORAL | Status: DC | PRN

## 2020-12-02 MED ORDER — CHLORHEXIDINE GLUCONATE 0.12 % MT SOLN
OROMUCOSAL | Status: AC
  Administered 2020-12-02: 15 mL via OROMUCOSAL
  Filled 2020-12-02: qty 15

## 2020-12-02 MED ORDER — PHENYLEPHRINE HCL-NACL 20-0.9 MG/250ML-% IV SOLN
INTRAVENOUS | Status: DC | PRN
  Administered 2020-12-02: 50 ug/min via INTRAVENOUS

## 2020-12-02 MED ORDER — DEXAMETHASONE SODIUM PHOSPHATE 10 MG/ML IJ SOLN
INTRAMUSCULAR | Status: DC | PRN
Start: 2020-12-02 — End: 2020-12-02
  Administered 2020-12-02: 8 mg via INTRAVENOUS

## 2020-12-02 MED ORDER — MIDAZOLAM HCL 2 MG/2ML IJ SOLN: 2.0000 mg | Freq: Once | INTRAMUSCULAR | Status: AC

## 2020-12-02 MED ORDER — BUPIVACAINE-EPINEPHRINE (PF) 0.5% -1:200000 IJ SOLN
INTRAMUSCULAR | Status: DC | PRN
  Administered 2020-12-02: 30 mL via PERINEURAL

## 2020-12-02 MED ORDER — FENTANYL CITRATE (PF) 100 MCG/2ML IJ SOLN: 50.0000 ug | Freq: Once | INTRAMUSCULAR | Status: AC

## 2020-12-02 MED ORDER — FENTANYL CITRATE (PF) 100 MCG/2ML IJ SOLN: 25.0000 ug | INTRAMUSCULAR | Status: DC | PRN

## 2020-12-02 MED ORDER — PANTOPRAZOLE SODIUM 40 MG PO TBEC: 40.0000 mg | DELAYED_RELEASE_TABLET | Freq: Every day | ORAL | Status: DC

## 2020-12-02 MED ORDER — VASOPRESSIN 20 UNIT/ML IV SOLN
INTRAVENOUS | Status: DC | PRN
  Administered 2020-12-02: 2 [IU] via INTRAVENOUS

## 2020-12-02 MED ORDER — LABETALOL HCL 5 MG/ML IV SOLN: 10.0000 mg | INTRAVENOUS | Status: DC | PRN

## 2020-12-02 MED ORDER — LIDOCAINE 2% (20 MG/ML) 5 ML SYRINGE
INTRAMUSCULAR | Status: AC
  Filled 2020-12-02: qty 5

## 2020-12-02 MED ORDER — MIDAZOLAM HCL 2 MG/2ML IJ SOLN
INTRAMUSCULAR | Status: AC
  Administered 2020-12-02: 2 mg via INTRAVENOUS
  Filled 2020-12-02: qty 2

## 2020-12-02 MED ORDER — POLYETHYLENE GLYCOL 3350 17 G PO PACK: 17.0000 g | PACK | Freq: Every day | ORAL | Status: DC | PRN

## 2020-12-02 MED ORDER — LIDOCAINE 2% (20 MG/ML) 5 ML SYRINGE
INTRAMUSCULAR | Status: DC | PRN
  Administered 2020-12-02: 20 mg via INTRAVENOUS

## 2020-12-02 MED ORDER — PHENYLEPHRINE 40 MCG/ML (10ML) SYRINGE FOR IV PUSH (FOR BLOOD PRESSURE SUPPORT)
PREFILLED_SYRINGE | INTRAVENOUS | Status: DC | PRN
  Administered 2020-12-02 (×4): 80 ug via INTRAVENOUS

## 2020-12-02 MED ORDER — HYDRALAZINE HCL 20 MG/ML IJ SOLN: 5.0000 mg | INTRAMUSCULAR | Status: DC | PRN

## 2020-12-02 MED ORDER — DOCUSATE SODIUM 100 MG PO CAPS: 100.0000 mg | ORAL_CAPSULE | Freq: Every day | ORAL | Status: DC

## 2020-12-02 MED ORDER — ALUM & MAG HYDROXIDE-SIMETH 200-200-20 MG/5ML PO SUSP: 15.0000 mL | ORAL | Status: DC | PRN

## 2020-12-02 MED ORDER — CEFAZOLIN SODIUM-DEXTROSE 2-4 GM/100ML-% IV SOLN
2.0000 g | Freq: Three times a day (TID) | INTRAVENOUS | Status: AC
  Administered 2020-12-02 (×2): 2 g via INTRAVENOUS
  Filled 2020-12-02 (×2): qty 100

## 2020-12-02 MED ORDER — ROPIVACAINE HCL 5 MG/ML IJ SOLN
INTRAMUSCULAR | Status: DC | PRN
  Administered 2020-12-02: 20 mL via PERINEURAL

## 2020-12-02 MED ORDER — MAGNESIUM CITRATE PO SOLN: 1.0000 | Freq: Once | ORAL | Status: DC | PRN

## 2020-12-02 MED ORDER — ALBUMIN HUMAN 5 % IV SOLN: INTRAVENOUS | Status: DC | PRN

## 2020-12-02 MED ORDER — ONDANSETRON HCL 4 MG/2ML IJ SOLN: 4.0000 mg | Freq: Four times a day (QID) | INTRAMUSCULAR | Status: DC | PRN

## 2020-12-02 MED ORDER — METOPROLOL TARTRATE 5 MG/5ML IV SOLN: 2.0000 mg | INTRAVENOUS | Status: DC | PRN

## 2020-12-02 MED ORDER — ONDANSETRON HCL 4 MG/2ML IJ SOLN
INTRAMUSCULAR | Status: DC | PRN
  Administered 2020-12-02: 4 mg via INTRAVENOUS

## 2020-12-02 MED ORDER — DEXAMETHASONE SODIUM PHOSPHATE 10 MG/ML IJ SOLN
INTRAMUSCULAR | Status: AC
  Filled 2020-12-02: qty 1

## 2020-12-02 MED ORDER — POTASSIUM CHLORIDE CRYS ER 20 MEQ PO TBCR: 20.0000 meq | EXTENDED_RELEASE_TABLET | Freq: Every day | ORAL | Status: DC | PRN

## 2020-12-02 MED ORDER — JUVEN PO PACK: 1.0000 | PACK | Freq: Two times a day (BID) | ORAL | Status: DC

## 2020-12-02 MED ORDER — OXYCODONE HCL 5 MG PO TABS: 10.0000 mg | ORAL_TABLET | ORAL | Status: DC | PRN

## 2020-12-02 MED ORDER — GUAIFENESIN-DM 100-10 MG/5ML PO SYRP: 15.0000 mL | ORAL_SOLUTION | ORAL | Status: DC | PRN

## 2020-12-02 MED ORDER — HYDROMORPHONE HCL 1 MG/ML IJ SOLN: 0.5000 mg | INTRAMUSCULAR | Status: DC | PRN

## 2020-12-02 MED ORDER — ACETAMINOPHEN 325 MG PO TABS
325.0000 mg | ORAL_TABLET | Freq: Four times a day (QID) | ORAL | Status: DC | PRN
  Filled 2020-12-02: qty 2

## 2020-12-02 MED ORDER — ASCORBIC ACID 500 MG PO TABS: 1000.0000 mg | ORAL_TABLET | Freq: Every day | ORAL | Status: DC

## 2020-12-02 MED ORDER — PHENYLEPHRINE 40 MCG/ML (10ML) SYRINGE FOR IV PUSH (FOR BLOOD PRESSURE SUPPORT)
PREFILLED_SYRINGE | INTRAVENOUS | Status: AC
  Filled 2020-12-02: qty 10

## 2020-12-02 MED ORDER — MAGNESIUM SULFATE 2 GM/50ML IV SOLN: 2.0000 g | Freq: Every day | INTRAVENOUS | Status: DC | PRN

## 2020-12-02 MED ORDER — FENTANYL CITRATE (PF) 250 MCG/5ML IJ SOLN
INTRAMUSCULAR | Status: AC
  Filled 2020-12-02: qty 5

## 2020-12-02 MED ORDER — PROPOFOL 10 MG/ML IV BOLUS
INTRAVENOUS | Status: DC | PRN
  Administered 2020-12-02: 150 mg via INTRAVENOUS

## 2020-12-02 MED ORDER — PROPOFOL 10 MG/ML IV BOLUS
INTRAVENOUS | Status: AC
  Filled 2020-12-02: qty 20

## 2020-12-02 SURGICAL SUPPLY — 35 items
BAG COUNTER SPONGE SURGICOUNT (BAG) ×2 IMPLANT
BAG SPNG CNTER NS LX DISP (BAG) ×1
BAG SURGICOUNT SPONGE COUNTING (BAG) ×1
BLADE SAW RECIP 87.9 MT (BLADE) ×4 IMPLANT
BLADE SURG 21 STRL SS (BLADE) ×3 IMPLANT
CANISTER WOUND CARE 500ML ATS (WOUND CARE) ×3 IMPLANT
CANISTER WOUNDNEG PRESSURE 500 (CANNISTER) ×2 IMPLANT
COVER SURGICAL LIGHT HANDLE (MISCELLANEOUS) ×3 IMPLANT
DRAPE DERMATAC (DRAPES) ×8 IMPLANT
DRAPE EXTREMITY T 121X128X90 (DISPOSABLE) ×3 IMPLANT
DRAPE HALF SHEET 40X57 (DRAPES) ×3 IMPLANT
DRAPE INCISE IOBAN 66X45 STRL (DRAPES) ×5 IMPLANT
DRAPE U-SHAPE 47X51 STRL (DRAPES) ×6 IMPLANT
DRESSING PREVENA PLUS CUSTOM (GAUZE/BANDAGES/DRESSINGS) ×1 IMPLANT
DRSG PREVENA PLUS CUSTOM (GAUZE/BANDAGES/DRESSINGS) ×3
DURAPREP 26ML APPLICATOR (WOUND CARE) ×3 IMPLANT
ELECT REM PT RETURN 9FT ADLT (ELECTROSURGICAL) ×3
ELECTRODE REM PT RTRN 9FT ADLT (ELECTROSURGICAL) ×1 IMPLANT
GLOVE SURG ORTHO LTX SZ9 (GLOVE) ×3 IMPLANT
GLOVE SURG UNDER POLY LF SZ9 (GLOVE) ×3 IMPLANT
GOWN STRL REUS W/ TWL XL LVL3 (GOWN DISPOSABLE) ×2 IMPLANT
GOWN STRL REUS W/TWL XL LVL3 (GOWN DISPOSABLE) ×3
KIT BASIN OR (CUSTOM PROCEDURE TRAY) ×3 IMPLANT
KIT TURNOVER KIT B (KITS) ×3 IMPLANT
MANIFOLD NEPTUNE II (INSTRUMENTS) ×3 IMPLANT
NS IRRIG 1000ML POUR BTL (IV SOLUTION) ×3 IMPLANT
PACK GENERAL/GYN (CUSTOM PROCEDURE TRAY) ×3 IMPLANT
PAD ARMBOARD 7.5X6 YLW CONV (MISCELLANEOUS) ×3 IMPLANT
PREVENA RESTOR ARTHOFORM 46X30 (CANNISTER) ×3 IMPLANT
PREVENA RESTOR AXIOFORM 29X28 (GAUZE/BANDAGES/DRESSINGS) ×2 IMPLANT
STAPLER VISISTAT 35W (STAPLE) ×2 IMPLANT
SUT ETHILON 2 0 PSLX (SUTURE) ×8 IMPLANT
SUT SILK 2 0 (SUTURE) ×3
SUT SILK 2-0 18XBRD TIE 12 (SUTURE) IMPLANT
TOWEL GREEN STERILE (TOWEL DISPOSABLE) ×3 IMPLANT

## 2020-12-02 NOTE — Anesthesia Preprocedure Evaluation (Addendum)
Anesthesia Evaluation  Patient identified by MRN, date of birth, ID band Patient awake    Reviewed: Allergy & Precautions, NPO status , Patient's Chart, lab work & pertinent test results  Airway Mallampati: III  TM Distance: >3 FB Neck ROM: Full  Mouth opening: Limited Mouth Opening Comment: Limited mouth opening likely 2/2 poor pt cooperation Dental  (+) Dental Advisory Given, Edentulous Upper, Edentulous Lower   Pulmonary neg pulmonary ROS,    Pulmonary exam normal breath sounds clear to auscultation       Cardiovascular hypertension, Pt. on medications + CAD, + Past MI, + Cardiac Stents, + Peripheral Vascular Disease and +CHF (grade 1 diastolic dysfunction)  Normal cardiovascular exam Rhythm:Regular Rate:Normal  Echo 2021: 1. Technically difficult study with poor acoustical windows and patient  cooperation even with definity contrast. Left ventricular ejection  fraction, by estimation, is 55 to 60%. The left ventricle has normal  function. Left ventricular endocardial border  not optimally defined to evaluate regional wall motion but grossly appear  normal. There is mild concentric left ventricular hypertrophy. Left  ventricular diastolic parameters are consistent with Grade I diastolic  dysfunction (impaired relaxation).  2. Right ventricular systolic function is normal. The right ventricular  size is normal. Tricuspid regurgitation signal is inadequate for assessing  PA pressure.  3. The mitral valve is normal in structure. No evidence of mitral valve  regurgitation. No evidence of mitral stenosis.  4. The aortic valve is normal in structure. Aortic valve regurgitation is  not visualized. No aortic stenosis is present.  5. Aortic dilatation noted. There is mild dilatation of the ascending  aorta and of the aortic root, measuring 38 mm.  6. The inferior vena cava is normal in size with greater than 50%  respiratory  variability, suggesting right atrial pressure of 3 mmHg.    Neuro/Psych CVA (04/2020) negative psych ROS   GI/Hepatic negative GI ROS, Neg liver ROS,   Endo/Other  diabetes, Poorly Controlled, Type 2, Oral Hypoglycemic Agents, Insulin DependentMorbid obesityBMI 39  Renal/GU CRFRenal diseaseCr 2.15, CKD 4  negative genitourinary   Musculoskeletal  (+) Arthritis , Osteoarthritis,    Abdominal   Peds negative pediatric ROS (+)  Hematology  (+) Blood dyscrasia, anemia , HIV,   Anesthesia Other Findings   Reproductive/Obstetrics negative OB ROS                             Anesthesia Physical  Anesthesia Plan  ASA: 3  Anesthesia Plan: General   Post-op Pain Management:  Regional for Post-op pain   Induction: Intravenous  PONV Risk Score and Plan: 2 and Ondansetron, Midazolam and Treatment may vary due to age or medical condition  Airway Management Planned: LMA  Additional Equipment: None  Intra-op Plan:   Post-operative Plan: Extubation in OR  Informed Consent: I have reviewed the patients History and Physical, chart, labs and discussed the procedure including the risks, benefits and alternatives for the proposed anesthesia with the patient or authorized representative who has indicated his/her understanding and acceptance.     Dental advisory given  Plan Discussed with: CRNA  Anesthesia Plan Comments:        Anesthesia Quick Evaluation

## 2020-12-02 NOTE — Interval H&P Note (Signed)
History and Physical Interval Note:  12/02/2020 7:46 AM  Keith [name]  has presented today for surgery, with the diagnosis of Dehiscence Left Below Knee Amputation.  The various methods of treatment have been discussed with the patient and family. After consideration of risks, benefits and other options for treatment, the patient has consented to  Procedure(s): REVISION LEFT BELOW KNEE AMPUTATION (Left) as a surgical intervention.  The patient's history has been reviewed, patient examined, no change in status, stable for surgery.  I have reviewed the patient's chart and labs.  Questions were answered to the patient's satisfaction.     [name]

## 2020-12-02 NOTE — Anesthesia Procedure Notes (Signed)
Procedure Name: LMA Insertion Date/Time: 12/02/2020 9:45 AM Performed by: Myna Bright, CRNA Pre-anesthesia Checklist: Patient identified, Emergency Drugs available, Suction available and Patient being monitored Patient Re-evaluated:Patient Re-evaluated prior to induction Oxygen Delivery Method: Circle system utilized Preoxygenation: Pre-oxygenation with 100% oxygen Induction Type: IV induction Ventilation: Mask ventilation without difficulty LMA: LMA inserted LMA Size: 5.0 Number of attempts: 1 Placement Confirmation: positive ETCO2 and breath sounds checked- equal and bilateral Tube secured with: Tape Dental Injury: Teeth and Oropharynx as per pre-operative assessment

## 2020-12-02 NOTE — Anesthesia Postprocedure Evaluation (Signed)
Anesthesia Post Note  Patient: Keith [name]  Procedure(s) Performed: REVISION LEFT BELOW KNEE AMPUTATION (Left: Leg Lower)     Patient location during evaluation: PACU Anesthesia Type: General Level of consciousness: awake and alert Pain management: pain level controlled Vital Signs Assessment: post-procedure vital signs reviewed and stable Respiratory status: spontaneous breathing, nonlabored ventilation, respiratory function stable and patient connected to nasal cannula oxygen Cardiovascular status: blood pressure returned to baseline and stable Postop Assessment: no apparent nausea or vomiting Anesthetic complications: no   No notable events documented.  Last Vitals:  Vitals:   12/02/20 1112 12/02/20 1132  BP: 117/77 112/73  Pulse: 79 81  Resp: 13 16  Temp: (!) 36.3 C 36.6 C  SpO2: 100% 97%    Last Pain:  Vitals:   12/02/20 1132  TempSrc: Oral  PainSc: 0-No pain                 Tiajuana Amass

## 2020-12-02 NOTE — Plan of Care (Signed)

## 2020-12-02 NOTE — Transfer of Care (Signed)
Immediate Anesthesia Transfer of Care Note  Patient: Keith [name]  Procedure(s) Performed: REVISION LEFT BELOW KNEE AMPUTATION (Left: Leg Lower)  Patient Location: PACU  Anesthesia Type:General  Level of Consciousness: awake, alert , oriented and patient cooperative  Airway & Oxygen Therapy: Patient Spontanous Breathing and Patient connected to face mask oxygen  Post-op Assessment: Report given to RN, Post -op Vital signs reviewed and stable and Patient moving all extremities  Post vital signs: Reviewed and stable  Last Vitals:  Vitals Value Taken Time  BP 113/83 12/02/20 1046  Temp 36.3 C 12/02/20 1046  Pulse 83 12/02/20 1053  Resp 10 12/02/20 1053  SpO2 98 % 12/02/20 1053  Vitals shown include unvalidated device data.  Last Pain:  Vitals:   12/02/20 1046  TempSrc:   PainSc: 0-No pain      Patients Stated Pain Goal: 0 ([id number])  Complications: No notable events documented.

## 2020-12-02 NOTE — Anesthesia Procedure Notes (Signed)
Anesthesia Regional Block: Adductor canal block   Pre-Anesthetic Checklist: , timeout performed,  Correct Patient, Correct Site, Correct Laterality,  Correct Procedure, Correct Position, site marked,  Risks and benefits discussed,  Surgical consent,  Pre-op evaluation,  At surgeon's request and post-op pain management  Laterality: Left  Prep: chloraprep       Needles:  Injection technique: Single-shot  Needle Type: Echogenic Needle     Needle Length: 9cm  Needle Gauge: 21     Additional Needles:   Procedures:,,,, ultrasound used (permanent image in chart),,    Narrative:  Start time: 12/02/2020 9:06 AM End time: 12/02/2020 9:13 AM Injection made incrementally with aspirations every 5 mL.  Performed by: Personally  Anesthesiologist: Suzette Battiest, MD

## 2020-12-02 NOTE — Anesthesia Procedure Notes (Signed)
Anesthesia Regional Block: Popliteal block   Pre-Anesthetic Checklist: , timeout performed,  Correct Patient, Correct Site, Correct Laterality,  Correct Procedure, Correct Position, site marked,  Risks and benefits discussed,  Surgical consent,  Pre-op evaluation,  At surgeon's request and post-op pain management  Laterality: Left  Prep: chloraprep       Needles:  Injection technique: Single-shot  Needle Type: Echogenic Needle     Needle Length: 9cm  Needle Gauge: 21     Additional Needles:   Procedures:,,,, ultrasound used (permanent image in chart),,    Narrative:  Start time: 12/02/2020 9:00 AM End time: 12/02/2020 9:06 AM Injection made incrementally with aspirations every 5 mL.  Performed by: Personally  Anesthesiologist: Suzette Battiest, MD

## 2020-12-02 NOTE — Progress Notes (Signed)
PROGRESS NOTE  Keith [name]  DOB: 1965-11-18  [name], NP [id number]  DOA: 10/17/2020  LOS: 17 days  Hospital Day: 77   Chief Complaint  Patient presents with   Foot Ulcer    Brief narrative: Keith [name] is a 55 y.o. male with PMH significant for DM2, HTN, HLD, CAD, CKD 3B, HIV, right BKA status.  Patient was sent to the ED on 7/12 by his orthopedic surgeon due to concern of left heel ulcer that failed outpatient therapy.   Subsequently, MRI confirmed osteomyelitis.  He underwent left transtibial amputation by [name].  During the hospitalization, he had worsening renal function.  Nephrology was consulted.  He had temporary dialysis catheter placed and dialysis was started.  Later, he developed hematuria and required fulguration and CBI along with blood transfusions.  Renal function improved; dialysis was stopped and nephrology signed off.  Subsequently, his wound dehisced with bleeding requiring blood transfusion.   He underwent left below-knee amputation revision by [name] on 11/17/2020.  Subjective: Patient was seen and examined in the morning of 8/27 before the surgery.  Not in distress.  Assessment/Plan: Acute kidney injury with metabolic acidosis  -Patient presented with worsening renal functions and started on dialysis during this admission but was then subsequently stopped and nephrology signed off.  AKI was felt to be due to ATN.  Tunneled dialysis catheter has already been removed. -Renal ultrasound was negative for hydronephrosis. -Creatinine gradually improving, 2.04 today.  Currently on IV fluid. Recent Labs    11/23/20 0239 11/24/20 0422 11/25/20 0734 11/26/20 0422 11/27/20 0345 11/28/20 1630 11/29/20 0721 11/30/20 0457 12/01/20 0232 12/02/20 0040  BUN '17  18 16 '$ 23* 24* 25* 27* 29* 29* 27* 24*  CREATININE 1.86*  1.75* 1.83* 1.97* 1.95* 2.05* 2.07* 2.38* 2.30* 2.03* 2.04*  CO2 '23  23 26 '$ 21* '22 23 24 25 22 22 23    '$ Left heel  osteomyelitis Left BKA after failed transtibial amputation on 10/27/20 with subsequent dehiscence of wound with bleeding Bilateral BKA status -Patient has had significant intermittent bleeding from the wound with wound dehiscence requiring blood transfusions. -Due to poor wound healing, patient is planned for revision of left BKA on 8/27.  Hematuria Acute urinary retention -11/08/2020 -Foley catheter placement with drainage of clots.  Urology was consulted  -8/4 - underwent cystoscopy with fulguration and started on CBI and Proscar.  Subsequently CBI was discontinued. -8/15-Foley catheter discontinued. -8/22-patient had another episode of hematuria and clots. Urology recommended Proscar, Uroxatrol and IV hydration. Hematuria is improving now.   Anemia due to acute blood loss from bladder and left stump -Hemoglobin has been running low mostly between 7 and 8.  It is at 8.1 today.  12 units of PRBC transfused so far this admission.  -Continue to monitor hemoglobin. Recent Labs    04/26/20 1228 04/27/20 0802 11/30/20 0457 11/30/20 1500 11/30/20 2002 12/01/20 0232 12/02/20 0040  HGB 10.7*   < > 6.5* 7.2* 7.9* 8.8* 8.1*  MCV 84.5   < > 92.3  --   --  88.7 90.1  VITAMINB12 451  --   --   --   --   --   --   FOLATE 8.7  --   --   --   --   --   --   FERRITIN 347*  --   --   --   --   --   --   TIBC 276  --   --   --   --   --   --  IRON 71  --   --   --   --   --   --   RETICCTPCT 1.8  --   --   --   --   --   --    < > = values in this interval not displayed.    Uncontrolled type 2 diabetes mellitus Hyperglycemia -A1c was 13.9 on May 2022.  Repeat A1c on 8/24 is at 5.9.  Wonder if it is truly low because of diet control in the hospital or spuriously low due to frequent blood loss and multiple transfusions.   -Currently blood sugar level is controlled on Jardiance 10 mg daily and sliding scale insulin Recent Labs  Lab 12/01/20 0741 12/01/20 1141 12/01/20 1536 12/01/20 2209  12/02/20 0831  GLUCAP 136* 145* 147* 129* 105*      CAD status post stenting in 03/2020 -Continue aspirin.  Because of recurrent bleeding, Brilinta has been discontinued for now since 11/19/2020 as per cardiology recommendations.   -Once bleeding stabilizes, will resume Brilinta.  Continue Vascepa and Lipitor  Hyperkalemia Resolved.  HIV Continue current regimen of antiretrovirals and dapsone as per ID.   Outpatient follow-up with ID   Mobility: PT eval obtained.  SNF recommended Code Status:   Code Status: Full Code  Nutritional status: Body mass index is 38.4 kg/m.     Diet:  Diet Order             Diet NPO time specified  Diet effective ____                  DVT prophylaxis: Not on heparin products because of frequent bleeding.  Bilateral BKA status, unable to use SCDs  Antimicrobials: None Fluid: Normal saline at 75 mill per hour Consultants: Orthopedics Family Communication: None at bedside  Status is: Inpatient  Remains inpatient appropriate because: Needs revision of amputation today 8/27  Dispo: The patient is from: Home              Anticipated d/c is to: SNF in 2 to 3 days              Patient currently is not medically stable to d/c.   Difficult to place patient No     Infusions:   sodium chloride 75 mL/hr at 12/02/20 0935   sodium chloride 10 mL/hr at 12/02/20 0859   [MAR Hold] magnesium sulfate bolus IVPB      Scheduled Meds:  [MAR Hold] alfuzosin  10 mg Oral Q breakfast   [MAR Hold] allopurinol  100 mg Oral Daily   [MAR Hold] vitamin C  1,000 mg Oral Daily   [MAR Hold] aspirin EC  81 mg Oral Daily   [MAR Hold] atorvastatin  80 mg Oral Daily   chlorhexidine  60 mL Topical Once   [MAR Hold] Chlorhexidine Gluconate Cloth  6 each Topical Q0600   [MAR Hold] dapsone  100 mg Oral Daily   [MAR Hold] docusate sodium  100 mg Oral Daily   [MAR Hold] dolutegravir  50 mg Oral Daily   [MAR Hold] doravirine  100 mg Oral Daily   [MAR Hold]  empagliflozin  10 mg Oral Daily   [MAR Hold] escitalopram  20 mg Oral Daily   [MAR Hold] finasteride  5 mg Oral Daily   [MAR Hold] gabapentin  100 mg Oral TID   [MAR Hold] icosapent Ethyl  2 g Oral BID   [MAR Hold] insulin aspart  0-5 Units Subcutaneous QHS   [facility]  Hold] lamiVUDine  300 mg Oral Daily   [MAR Hold] mupirocin ointment  1 application Nasal BID   [MAR Hold] nutrition supplement (JUVEN)  1 packet Oral BID BM   [MAR Hold] oxybutynin  2.5 mg Oral BID   [MAR Hold] oxyCODONE  10 mg Oral QID   [MAR Hold] pantoprazole  40 mg Oral Daily   [MAR Hold] senna  2 tablet Oral QHS    Antimicrobials: Anti-infectives (From admission, onward)    Start     Dose/Rate Route Frequency Ordered Stop   12/02/20 0600  ceFAZolin (ANCEF) IVPB 3g/100 mL premix        3 g 200 mL/hr over 30 Minutes Intravenous On call to O.R. 12/01/20 1909 12/02/20 0950   12/01/20 0600  ceFAZolin (ANCEF) IVPB 2g/100 mL premix  Status:  Discontinued        2 g 200 mL/hr over 30 Minutes Intravenous On call to O.R. 11/30/20 1817 11/30/20 1819   12/01/20 0600  ceFAZolin (ANCEF) IVPB 3g/100 mL premix        3 g 200 mL/hr over 30 Minutes Intravenous On call to O.R. 11/30/20 1820 12/02/20 0559   11/18/20 0100  ceFAZolin (ANCEF) IVPB 2g/100 mL premix        2 g 200 mL/hr over 30 Minutes Intravenous Every 8 hours 11/17/20 2045 11/18/20 1038   11/17/20 0600  ceFAZolin (ANCEF) IVPB 3g/100 mL premix        3 g 200 mL/hr over 30 Minutes Intravenous On call to O.R. 11/16/20 1941 11/17/20 1901   11/11/20 1200  vancomycin (VANCOREADY) IVPB 500 mg/100 mL  Status:  Discontinued        500 mg 100 mL/hr over 60 Minutes Intravenous Every T-Th-Sa (Hemodialysis) 11/09/20 1414 11/11/20 1328   11/10/20 0000  Dolutegravir-lamiVUDine (DOVATO) 50-300 MG TABS        1 tablet Oral Daily 11/10/20 1048     11/10/20 0000  doravirine (PIFELTRO) 100 MG TABS tablet        100 mg Oral Daily 11/10/20 1048     11/08/20 1000  [MAR Hold]  dolutegravir  (TIVICAY) tablet 50 mg        (MAR Hold since Sat 12/02/2020 at 0841.Hold Reason: Transfer to a Procedural area)   50 mg Oral Daily 11/07/20 1451     11/08/20 1000  [MAR Hold]  lamiVUDine (EPIVIR) tablet 300 mg        (MAR Hold since Sat 12/02/2020 at 0841.Hold Reason: Transfer to a Procedural area)   300 mg Oral Daily 11/07/20 1451     11/08/20 1000  [MAR Hold]  doravirine (PIFELTRO) tablet 100 mg        (MAR Hold since Sat 12/02/2020 at 0841.Hold Reason: Transfer to a Procedural area)   100 mg Oral Daily 11/07/20 1451     11/02/20 1130  ceFAZolin (ANCEF) IVPB 2g/100 mL premix        2 g 200 mL/hr over 30 Minutes Intravenous To Radiology 11/02/20 1042 11/02/20 1704   10/27/20 1245  ceFAZolin (ANCEF) IVPB 3g/100 mL premix        3 g 200 mL/hr over 30 Minutes Intravenous On call to O.R. 10/27/20 0755 10/27/20 1608   10/20/20 1415  linezolid (ZYVOX) tablet 600 mg  Status:  Discontinued        600 mg Oral Every 12 hours 10/20/20 1326 10/28/20 1115   10/19/20 1445  metroNIDAZOLE (FLAGYL) tablet 500 mg  Status:  Discontinued  500 mg Oral Every 12 hours 10/19/20 1347 10/28/20 1115   10/18/20 1600  ceFEPIme (MAXIPIME) 2 g in sodium chloride 0.9 % 100 mL IVPB  Status:  Discontinued        2 g 200 mL/hr over 30 Minutes Intravenous Every 12 hours 10/18/20 0414 10/28/20 1112   10/18/20 1000  [MAR Hold]  dapsone tablet 100 mg        (MAR Hold since Sat 12/02/2020 at 0841.Hold Reason: Transfer to a Procedural area)   100 mg Oral Daily 10/18/20 0425     10/18/20 1000  bictegravir-emtricitabine-tenofovir AF (BIKTARVY) 50-200-25 MG per tablet 1 tablet  Status:  Discontinued        1 tablet Oral Daily 10/18/20 0610 11/07/20 1451   10/18/20 0415  metroNIDAZOLE (FLAGYL) IVPB 500 mg  Status:  Discontinued        500 mg 100 mL/hr over 60 Minutes Intravenous Every 8 hours 10/18/20 0404 10/19/20 1347   10/18/20 0415  ceFEPIme (MAXIPIME) 2 g in sodium chloride 0.9 % 100 mL IVPB        2 g 200 mL/hr over 30  Minutes Intravenous  Once 10/18/20 0414 10/18/20 0500   10/18/20 0345  vancomycin (VANCOREADY) IVPB 2000 mg/400 mL        2,000 mg 200 mL/hr over 120 Minutes Intravenous  Once 10/18/20 0335 10/18/20 0608       PRN meds: 0.9 % irrigation (POUR BTL), [MAR Hold] acetaminophen, [MAR Hold] alum & mag hydroxide-simeth, [MAR Hold] bisacodyl, [MAR Hold] diphenoxylate-atropine, [MAR Hold] guaiFENesin-dextromethorphan, [MAR Hold] hydrALAZINE, [MAR Hold]  HYDROmorphone (DILAUDID) injection, [MAR Hold] labetalol, [MAR Hold] magnesium sulfate bolus IVPB, [MAR Hold] metoprolol tartrate, [MAR Hold] ondansetron, [MAR Hold] oxyCODONE, [MAR Hold] oxyCODONE, [MAR Hold] phenol, [MAR Hold] polyethylene glycol, [MAR Hold] potassium chloride   Objective: Vitals:   12/02/20 0915 12/02/20 0920  BP: 121/74 112/71  Pulse: 80 81  Resp: 13 18  Temp:    SpO2: 98% 100%    Intake/Output Summary (Last 24 hours) at 12/02/2020 1022 Last data filed at 12/02/2020 1014 Gross per 24 hour  Intake 3365.41 ml  Output 775 ml  Net 2590.41 ml    Filed Weights   11/20/20 0618 11/23/20 0618 11/23/20 2144  Weight: 117.3 kg 121.4 kg 121.4 kg   Weight change:  Body mass index is 38.4 kg/m.   Physical Exam: General exam: Pleasant, middle-aged African-American male.  Not in physical distress Skin: No rashes, lesions or ulcers. HEENT: Atraumatic, normocephalic, no obvious bleeding Lungs: Clear to auscultation bilaterally CVS: Regular rate and rhythm, no murmur GI/Abd soft, nontender, nondistended, bowel sound present CNS: Alert, awake, oriented x3 Psychiatry: Mood appropriate Extremities: Prior right BKA status. New left BKA with bandage around lower leg.  Not bleeding at this time.  Data Review: I have personally reviewed the laboratory data and studies available.  Recent Labs  Lab 11/28/20 1630 11/29/20 0721 11/30/20 0457 11/30/20 1500 11/30/20 2002 12/01/20 0232 12/02/20 0040  WBC 9.3 11.1* 10.9*  --   --   9.0 10.0  NEUTROABS 6.8 8.3* 7.8*  --   --  6.0 7.4  HGB 7.5* 7.4* 6.5* 7.2* 7.9* 8.8* 8.1*  HCT 22.7* 22.4* 20.3* 22.1* 24.3* 26.8* 24.7*  MCV 90.1 90.7 92.3  --   --  88.7 90.1  PLT 532* 509* 475*  --   --  459* 444*    Recent Labs  Lab 11/28/20 0134 11/28/20 1630 11/29/20 0721 11/30/20 0457 12/01/20 0232 12/02/20 0040  NA  --  134* 133* 134* 133* 134*  K  --  4.7 4.7 4.6 4.6 5.1  CL  --  102 101 101 103 104  CO2  --  '24 25 22 22 23  '$ GLUCOSE  --  140* 127* 121* 141* 122*  BUN  --  27* 29* 29* 27* 24*  CREATININE  --  2.07* 2.38* 2.30* 2.03* 2.04*  CALCIUM  --  8.6* 8.6* 8.8* 8.7* 8.7*  MG 2.0  --  1.9 1.9 2.0 1.9  PHOS  --  3.9 3.9 3.9 3.7 4.1     F/u labs ordered Unresulted Labs (From admission, onward)     Start     Ordered   11/21/20 0500  CBC with Differential/Platelet  Daily,   R     Question:  Specimen collection method  Answer:  Lab=Lab collect   11/20/20 1010   11/21/20 0500  Magnesium  Daily,   R     Question:  Specimen collection method  Answer:  Lab=Lab collect   11/20/20 1010   11/02/20 0500  Renal function panel  Daily,   R     Question:  Specimen collection method  Answer:  Lab=Lab collect   11/01/20 1153            Signed, Terrilee Croak, MD [facility] 12/02/2020

## 2020-12-02 NOTE — Op Note (Signed)
12/02/2020  10:44 AM  PATIENT:  Keith [name]    PRE-OPERATIVE DIAGNOSIS:  Dehiscence Left Below Knee Amputation  POST-OPERATIVE DIAGNOSIS:  Same  PROCEDURE:  REVISION LEFT BELOW KNEE AMPUTATION Application of Prevena customizable and Arthur form wound VAC  SURGEON:  [name], MD  PHYSICIAN ASSISTANT:None ANESTHESIA:   General  PREOPERATIVE INDICATIONS:  [name] is a  55 y.o. male with a diagnosis of Dehiscence Left Below Knee Amputation who failed conservative measures and elected for surgical management.    The risks benefits and alternatives were discussed with the patient preoperatively including but not limited to the risks of infection, bleeding, nerve injury, cardiopulmonary complications, the need for revision surgery, among others, and the patient was willing to proceed.  OPERATIVE IMPLANTS: None  '@ENCIMAGES'$ @  OPERATIVE FINDINGS: Patient had no abscess all nonviable tissue was excised there was a large hematoma.    OPERATIVE PROCEDURE: Patient was brought the operating room and underwent a general anesthetic.  After adequate levels anesthesia were obtained patient's left lower extremity was prepped using DuraPrep draped into a sterile field a timeout was called.  A large fishmouth incision was made proximal to the wound dehisced area.  This was carried sharply down to bone.  A reciprocating saw was used to resect the distal 3 cm of the tibia and fibula.  The vascular bundles were suture ligated with 2-0 silk electrocardio was used for hemostasis.  Patient did have a significant amount of oozing from the muscle.  The muscle was healthy and viable margins were clear.  The deep and suprafascial layers and skin was closed using 2-0 nylon the skin was closed using staples a Praveena customizable and Arthur form wound VAC was applied this was covered with derma tack this had a good suction fit the stump shrinker and limb protector were applied.  Patient was extubated  taken the PACU in stable condition   DISCHARGE PLANNING:  Antibiotic duration: Continue antibiotics for 24 hours  Weightbearing: Nonweightbearing on the left  Pain medication: Opioid pathway  Dressing care/ Wound VAC: Continue wound VAC for 1 week  Ambulatory devices: Walker  Discharge to: Discharge planning based on therapy recommendations  Follow-up: In the office 1 week post operative.

## 2020-12-03 ENCOUNTER — Encounter (HOSPITAL_COMMUNITY): Payer: Self-pay | Admitting: Orthopedic Surgery

## 2020-12-03 ENCOUNTER — Other Ambulatory Visit: Payer: Self-pay

## 2020-12-03 DIAGNOSIS — N179 Acute kidney failure, unspecified: Secondary | ICD-10-CM | POA: Diagnosis not present

## 2020-12-03 LAB — CBC WITH DIFFERENTIAL/PLATELET
Abs Immature Granulocytes: 0.15 10*3/uL — ABNORMAL HIGH (ref 0.00–0.07)
Basophils Absolute: 0 10*3/uL (ref 0.0–0.1)
Basophils Relative: 0 %
Eosinophils Absolute: 0 10*3/uL (ref 0.0–0.5)
Eosinophils Relative: 0 %
HCT: 21.1 % — ABNORMAL LOW (ref 39.0–52.0)
Hemoglobin: 7.1 g/dL — ABNORMAL LOW (ref 13.0–17.0)
Immature Granulocytes: 1 %
Lymphocytes Relative: 6 %
Lymphs Abs: 1 10*3/uL (ref 0.7–4.0)
MCH: 30.2 pg (ref 26.0–34.0)
MCHC: 33.6 g/dL (ref 30.0–36.0)
MCV: 89.8 fL (ref 80.0–100.0)
Monocytes Absolute: 1 10*3/uL (ref 0.1–1.0)
Monocytes Relative: 6 %
Neutro Abs: 14 10*3/uL — ABNORMAL HIGH (ref 1.7–7.7)
Neutrophils Relative %: 87 %
Platelets: 404 10*3/uL — ABNORMAL HIGH (ref 150–400)
RBC: 2.35 MIL/uL — ABNORMAL LOW (ref 4.22–5.81)
RDW: 14.6 % (ref 11.5–15.5)
WBC: 16.1 10*3/uL — ABNORMAL HIGH (ref 4.0–10.5)
nRBC: 0 % (ref 0.0–0.2)

## 2020-12-03 LAB — MAGNESIUM: Magnesium: 1.8 mg/dL (ref 1.7–2.4)

## 2020-12-03 LAB — GLUCOSE, CAPILLARY
Glucose-Capillary: 141 mg/dL — ABNORMAL HIGH (ref 70–99)
Glucose-Capillary: 144 mg/dL — ABNORMAL HIGH (ref 70–99)
Glucose-Capillary: 168 mg/dL — ABNORMAL HIGH (ref 70–99)
Glucose-Capillary: 152 mg/dL — ABNORMAL HIGH (ref 70–99)

## 2020-12-03 LAB — RENAL FUNCTION PANEL
Albumin: 2.3 g/dL — ABNORMAL LOW (ref 3.5–5.0)
Anion gap: 10 (ref 5–15)
BUN: 25 mg/dL — ABNORMAL HIGH (ref 6–20)
CO2: 20 mmol/L — ABNORMAL LOW (ref 22–32)
Calcium: 8.3 mg/dL — ABNORMAL LOW (ref 8.9–10.3)
Chloride: 100 mmol/L (ref 98–111)
Creatinine, Ser: 2.24 mg/dL — ABNORMAL HIGH (ref 0.61–1.24)
GFR, Estimated: 34 mL/min — ABNORMAL LOW (ref >60)
Glucose, Bld: 160 mg/dL — ABNORMAL HIGH (ref 70–99)
Phosphorus: 3 mg/dL (ref 2.5–4.6)
Potassium: 4.9 mmol/L (ref 3.5–5.1)
Sodium: 130 mmol/L — ABNORMAL LOW (ref 135–145)

## 2020-12-03 MED ORDER — HYDROMORPHONE HCL 1 MG/ML IJ SOLN
2.0000 mg | Freq: Four times a day (QID) | INTRAMUSCULAR | Status: DC | PRN
  Administered 2020-12-03 – 2020-12-05 (×6): 2 mg via INTRAVENOUS
  Filled 2020-12-03 (×6): qty 2

## 2020-12-03 NOTE — Progress Notes (Signed)
[name], male   DOB: 19-Mar-1966, 55 y.o.   MRN: [name] Patient is status post revision left BKA.  Postop day 1.  No drainage in the wound VAC canister.  Patient states he is having some pain.  The compression sock was adjusted.

## 2020-12-03 NOTE — Progress Notes (Signed)
Physical Therapy Treatment Patient Details Name: Keith [name] MRN: [name] DOB: 19-Dec-1965 Today's Date: 12/03/2020    History of Present Illness 55 y.o. male  who is sent to the ED by his orthopedic surgery provider due to concern for left heel ulcer that had failed outpatient therapy, now s/p L BKA 10/27/20. s/p REVISIONs L AMPUTATION BELOW KNEE on 8/12, and 8/27;  with medical history significant for HIV, CKD 3B, type 2 diabetes, hypertension, hyperlipidemia, coronary artery disease, status post right below the knee amputation    PT Comments    Continuing work on functional mobility and activity tolerance, post another L BKA revision on 8/27; Session focused on transfers and wheelchair mobility; Today, Keith [name] seemed less open to cueing for techniques for transfer efficiency, and he was resistant to having physical assist or close guard for safety (Pt does not like limb protector; with 2 revisions, it is prudent to use the limb protector with mobility); with lateral scoot transfers, Difficulty taking in cues to sit upright and push with LUE, to make scooting less taxing and take advantage of head-hips relationship to scoot hips;   Notable good carryover of cues for wc propulsion from previous session;  Less difficulty managing in tight spaces as well; Opted to try and use R prosthesis for help with steering wc, however he tends to keep prosthesis low, and it often contacted teh ground, pulling it under teh wc as he rolled forward; many safety cues; Pt requested to be pushed in wheelchair (almost insistent); rationale given for him to practice himself for skill-building and strength building;   Goals updated, next goal updated due 12/17/20  Follow Up Recommendations  SNF     Equipment Recommendations  Wheelchair (measurements PT);Wheelchair cushion (measurements PT)    Recommendations for Other Services       Precautions / Restrictions Precautions Precautions: Fall Precaution  Comments: B BKA's: R healed,  L BKA rev 8/27, RLE prosthesis Required Braces or Orthoses: Other Brace Other Brace: limb protector on left (Pt does not like limb protector; with 2 revisions, it is prudent to use the limb protector with mobility) Restrictions RLE Weight Bearing: Weight bearing as tolerated LLE Weight Bearing: Non weight bearing    Mobility  Bed Mobility   Bed Mobility: Supine to Sit     Supine to sit: Modified independent (Device/Increase time);HOB elevated          Transfers Overall transfer level: Needs assistance Equipment used: Rolling walker (2 wheeled) Transfers: Public house manager;Lateral/Scoot Transfers     Squat pivot transfers: Min guard    Lateral/Scoot Transfers: Min guard;+2 safety/equipment (second perosn helpful for line management) General transfer comment: Transferred bed to wheelchair towards his R side with R prosthesis on; performed mixture of small lateral scoots/squat pivots in succession; extremely close guard as pt got close to EOB with limbguard pointing at the floor; Pt did not want such close guard, but it was warrranted for safety; Notable that pt proble-solved to use RW positioned close for hand holds (wc lacked armrests); Performed lateral scoot wheelchair to recliner with drop arm down towards his right; tended to lean and reach for R armest and pull self over in very inefficiient scooting; Difficulty taking in cues to sit upright and push with LUE, to make scooting less taxing and take advantage of head-hips relationship to scoot hips  Ambulation/Gait                 Stairs  Information systems manager propulsion: Both upper extremities Wheelchair parts: Needs assistance Distance: 80 Wheelchair Assistance Details (indicate cue type and reason): Notable good carryover of cues to use UEs simultaneously from last session covering wheelchair mobility; Less difficulty managing in tight  spaces as well; Pt requested to be pushed in wheelchair (almost insistent); rationale given for him to practice himself for skill-building adn strength building; Opted to try and use R prosthesis for help with steering wc, however he tends to keep prosthesis low, and it often contacted teh ground, pulling it under teh wc as he rolled forward; many safety cues  Modified Rankin (Stroke Patients Only)       Balance     Sitting balance-Leahy Scale: Good                                      Cognition Arousal/Alertness: Awake/alert Behavior During Therapy: WFL for tasks assessed/performed;Impulsive Overall Cognitive Status: Impaired/Different from baseline Area of Impairment: Awareness;Safety/judgement;Following commands;Problem solving                       Following Commands: Follows one step commands inconsistently;Follows one step commands with increased time Safety/Judgement: Decreased awareness of safety;Decreased awareness of deficits Awareness: Emergent Problem Solving: Slow processing;Difficulty sequencing;Requires verbal cues General Comments: Pt with poor problem-solving on how to safely transfer between surfaces, needing extensive cues and asisstance to rearrange furniture to ensure safety. Less able to attend to and follow cues to help make movement more efficient, especially during the task (for example, multimodal cueing to take advantage of head-hips relationship, or cues for more efficient wc propulsion      Exercises      General Comments General comments (skin integrity, edema, etc.): Notable DOE 2-3/4 with pt performing wheelchair propulsion; HR 100 and O2 sats 98-100% on room air after wc traiing      Pertinent Vitals/Pain Pain Assessment: 0-10 Pain Score: 9  Pain Location: L residual limb Pain Descriptors / Indicators: Grimacing Pain Intervention(s): Premedicated before session    Home Living                      Prior  Function            PT Goals (current goals can now be found in the care plan section) Acute Rehab PT Goals Patient Stated Goal: Reported wanting to get into wheelchair and finish session in the recliner PT Goal Formulation: With patient Time For Goal Achievement: 12/17/20 Potential to Achieve Goals: Fair Additional Goals Additional Goal #1: Pt will propel wheelchair inluding turns and backwards 1000 ft independently, with no notable DOE Progress towards PT goals: Progressing toward goals (Updated goals today)    Frequency    Min 3X/week      PT Plan Current plan remains appropriate    Co-evaluation              AM-PAC PT "6 Clicks" Mobility   Outcome Measure  Help needed turning from your back to your side while in a flat bed without using bedrails?: None Help needed moving from lying on your back to sitting on the side of a flat bed without using bedrails?: None Help needed moving to and from a bed to a chair (including a wheelchair)?: A Lot Help needed standing up from a chair using your arms (e.g., wheelchair or bedside chair)?: A Lot  Help needed to walk in hospital room?: Total Help needed climbing 3-5 steps with a railing? : Total 6 Click Score: 14    End of Session Equipment Utilized During Treatment: Gait belt;Other (comment) (R prosthesis) Activity Tolerance: Patient tolerated treatment well Patient left: in chair;with call bell/phone within reach;with chair alarm set   PT Visit Diagnosis: Unsteadiness on feet (R26.81);Other abnormalities of gait and mobility (R26.89);Pain;Muscle weakness (generalized) (M62.81);Difficulty in walking, not elsewhere classified (R26.2) Pain - Right/Left: Left Pain - part of body: Leg     Time: 1025-1100 PT Time Calculation (min) (ACUTE ONLY): 35 min  Charges:  $Therapeutic Activity: 8-22 mins $Wheel Chair Management: 8-22 mins                     Roney Marion, PT  Acute Rehabilitation Services Pager  [phone] Office [place] 12/03/2020, 11:42 AM

## 2020-12-03 NOTE — Plan of Care (Signed)

## 2020-12-03 NOTE — Progress Notes (Signed)
PROGRESS NOTE  KHALYL CASABLANCA  DOB: 05/07/1965  [name], NP [id number]  DOA: 10/17/2020  LOS: 55 days  Hospital Day: 41   Chief Complaint  Patient presents with   Foot Ulcer    Brief narrative: Keith [name] is a 55 y.o. male with PMH significant for DM2, HTN, HLD, CAD, CKD 3B, HIV, right BKA status.  Patient was sent to the ED on 7/12 by his orthopedic surgeon due to concern of left heel ulcer that failed outpatient therapy.   Subsequently, MRI confirmed osteomyelitis.  He underwent left transtibial amputation by [name].  During the hospitalization, he had worsening renal function.  Nephrology was consulted.  He had temporary dialysis catheter placed and dialysis was started.  Later, he developed hematuria and required fulguration and CBI along with blood transfusions.  Renal function improved; dialysis was stopped and nephrology signed off.  Subsequently, his wound dehisced with bleeding requiring blood transfusion.   See below for details.  Subjective: Patient was seen and examined in the morning. Lying on bed.  Taking his request.  States inadequate pain control.  Asking to increase the dose of Dilaudid.  Assessment/Plan: Acute kidney injury with metabolic acidosis  -Patient presented with worsening renal functions and started on dialysis during this admission but was then subsequently stopped and nephrology signed off.  AKI was felt to be due to ATN.  Tunneled dialysis catheter has already been removed. -Renal ultrasound was negative for hydronephrosis. -Creatinine gradually improved but has been up in last 24 hours probably because of surgery.  Currently on normal saline which I will continue at 75 mill per hour.   Recent Labs    11/24/20 0422 11/25/20 0734 11/26/20 0422 11/27/20 0345 11/28/20 1630 11/29/20 0721 11/30/20 0457 12/01/20 0232 12/02/20 0040 12/03/20 0148  BUN 16 23* 24* 25* 27* 29* 29* 27* 24* 25*  CREATININE 1.83* 1.97* 1.95* 2.05*  2.07* 2.38* 2.30* 2.03* 2.04* 2.24*  CO2 26 21* '22 23 24 25 22 22 23 '$ 20*    Left heel osteomyelitis Left BKA after failed transtibial amputation on 10/27/20 with subsequent dehiscence of wound with bleeding Bilateral BKA status -7/22, patient had transtibial amputation which failed leading to wound dehiscence and bleeding.  -8/12, patient had revision amputation below-knee.  Patient continued to have poor wound healing and bleeding.   -8/27, patient had revision left BKA. -complains of inadequate pain control.  Increase Dilaudid from 1 mg to 2 mg but cut back the frequency from every 4 hours to every 6 hours PRN.  Also continue oxycodone as needed.  Hematuria Acute urinary retention -11/08/2020 -Foley catheter placement with drainage of clots.  Urology was consulted  -8/4 - underwent cystoscopy with fulguration and started on CBI and Proscar.  Subsequently CBI was discontinued. -8/15-Foley catheter discontinued. -8/22-patient had another episode of hematuria and clots. Urology recommended Proscar, Uroxatrol and IV hydration. Hematuria is improving now.   Anemia due to acute blood loss from bladder and left stump -Hemoglobin has been running low mostly between 7 and 8.  12 units of PRBC transfused so far this admission.  -Hemoglobin down to 7.1 today probably because of Intra-Op loss yesterday.  Repeat hemoglobin tomorrow.  Plan to transfuse if less than 7. Recent Labs    04/26/20 1228 04/27/20 0802 11/30/20 1500 11/30/20 2002 12/01/20 0232 12/02/20 0040 12/03/20 0148  HGB 10.7*   < > 7.2* 7.9* 8.8* 8.1* 7.1*  MCV 84.5   < >  --   --  88.7 90.1 89.8  VITAMINB12 451  --   --   --   --   --   --   FOLATE 8.7  --   --   --   --   --   --   FERRITIN 347*  --   --   --   --   --   --   TIBC 276  --   --   --   --   --   --   IRON 71  --   --   --   --   --   --   RETICCTPCT 1.8  --   --   --   --   --   --    < > = values in this interval not displayed.    Uncontrolled type 2 diabetes  mellitus Hyperglycemia -A1c was 13.9 on May 2022.  Repeat A1c on 8/24 is at 5.9.  Wonder if it is truly low because of diet control in the hospital or spuriously low due to frequent blood loss and multiple transfusions.   -Currently on Jardiance 10 mg daily and sliding scale insulin.  Blood sugar level yesterday was elevated to over 200 blood previously was in the 100s.  Continue to monitor on the same regimen for now. Recent Labs  Lab 12/02/20 1049 12/02/20 1146 12/02/20 1639 12/02/20 2214 12/03/20 0618  GLUCAP 124* 130* 266* 228* 141*      CAD status post stenting in 03/2020 -Continue aspirin.  Because of recurrent bleeding, Brilinta was discontinued by cardiology on 11/19/2020.   -Once bleeding stabilizes, will plan to resume Brilinta.  Continue Vascepa and Lipitor  HIV -Continue current regimen of antiretrovirals and dapsone as per ID.   -Outpatient follow-up with ID   Mobility: PT eval obtained.  SNF recommended Code Status:   Code Status: Full Code  Nutritional status: Body mass index is 38.4 kg/m.     Diet:  Diet Order             Diet Carb Modified Fluid consistency: Thin; Room service appropriate? Yes  Diet effective now                  DVT prophylaxis: Not on heparin products because of frequent bleeding.  Bilateral BKA status, unable to use SCDs SCD's Start: 12/02/20 1135 Antimicrobials: None Fluid: Normal saline at 75 mill per hour Consultants: Orthopedics Family Communication: None at bedside  Status is: Inpatient  Remains inpatient appropriate because: POD1 after revision of amputation on 8/27.  Dispo: The patient is from: Home              Anticipated d/c is to: SNF in 2 to 3 days              Patient currently is not medically stable to d/c.   Difficult to place patient No     Infusions:   sodium chloride 75 mL/hr at 12/02/20 0935   sodium chloride 75 mL/hr at 12/03/20 [id number]   magnesium sulfate bolus IVPB      Scheduled Meds:   alfuzosin  10 mg Oral Q breakfast   allopurinol  100 mg Oral Daily   vitamin C  1,000 mg Oral Daily   aspirin EC  81 mg Oral Daily   atorvastatin  80 mg Oral Daily   Chlorhexidine Gluconate Cloth  6 each Topical Q0600   dapsone  100 mg Oral Daily   docusate sodium  100 mg  Oral Daily   dolutegravir  50 mg Oral Daily   doravirine  100 mg Oral Daily   empagliflozin  10 mg Oral Daily   escitalopram  20 mg Oral Daily   finasteride  5 mg Oral Daily   gabapentin  100 mg Oral TID   icosapent Ethyl  2 g Oral BID   insulin aspart  0-5 Units Subcutaneous QHS   lamiVUDine  300 mg Oral Daily   mupirocin ointment  1 application Nasal BID   nutrition supplement (JUVEN)  1 packet Oral BID BM   oxybutynin  2.5 mg Oral BID   oxyCODONE  10 mg Oral QID   pantoprazole  40 mg Oral Daily   senna  2 tablet Oral QHS   zinc sulfate  220 mg Oral Daily    Antimicrobials: Anti-infectives (From admission, onward)    Start     Dose/Rate Route Frequency Ordered Stop   12/02/20 1230  ceFAZolin (ANCEF) IVPB 2g/100 mL premix        2 g 200 mL/hr over 30 Minutes Intravenous Every 8 hours 12/02/20 1134 12/02/20 2245   12/02/20 0600  ceFAZolin (ANCEF) IVPB 3g/100 mL premix        3 g 200 mL/hr over 30 Minutes Intravenous On call to O.R. 12/01/20 1909 12/02/20 0950   12/01/20 0600  ceFAZolin (ANCEF) IVPB 2g/100 mL premix  Status:  Discontinued        2 g 200 mL/hr over 30 Minutes Intravenous On call to O.R. 11/30/20 1817 11/30/20 1819   12/01/20 0600  ceFAZolin (ANCEF) IVPB 3g/100 mL premix        3 g 200 mL/hr over 30 Minutes Intravenous On call to O.R. 11/30/20 1820 12/02/20 0559   11/18/20 0100  ceFAZolin (ANCEF) IVPB 2g/100 mL premix        2 g 200 mL/hr over 30 Minutes Intravenous Every 8 hours 11/17/20 2045 11/18/20 1038   11/17/20 0600  ceFAZolin (ANCEF) IVPB 3g/100 mL premix        3 g 200 mL/hr over 30 Minutes Intravenous On call to O.R. 11/16/20 1941 11/17/20 1901   11/11/20 1200  vancomycin  (VANCOREADY) IVPB 500 mg/100 mL  Status:  Discontinued        500 mg 100 mL/hr over 60 Minutes Intravenous Every T-Th-Sa (Hemodialysis) 11/09/20 1414 11/11/20 1328   11/10/20 0000  Dolutegravir-lamiVUDine (DOVATO) 50-300 MG TABS        1 tablet Oral Daily 11/10/20 1048     11/10/20 0000  doravirine (PIFELTRO) 100 MG TABS tablet        100 mg Oral Daily 11/10/20 1048     11/08/20 1000  dolutegravir (TIVICAY) tablet 50 mg        50 mg Oral Daily 11/07/20 1451     11/08/20 1000  lamiVUDine (EPIVIR) tablet 300 mg        300 mg Oral Daily 11/07/20 1451     11/08/20 1000  doravirine (PIFELTRO) tablet 100 mg        100 mg Oral Daily 11/07/20 1451     11/02/20 1130  ceFAZolin (ANCEF) IVPB 2g/100 mL premix        2 g 200 mL/hr over 30 Minutes Intravenous To Radiology 11/02/20 1042 11/02/20 1704   10/27/20 1245  ceFAZolin (ANCEF) IVPB 3g/100 mL premix        3 g 200 mL/hr over 30 Minutes Intravenous On call to O.R. 10/27/20 0755 10/27/20 1608   10/20/20 1415  linezolid (  ZYVOX) tablet 600 mg  Status:  Discontinued        600 mg Oral Every 12 hours 10/20/20 1326 10/28/20 1115   10/19/20 1445  metroNIDAZOLE (FLAGYL) tablet 500 mg  Status:  Discontinued        500 mg Oral Every 12 hours 10/19/20 1347 10/28/20 1115   10/18/20 1600  ceFEPIme (MAXIPIME) 2 g in sodium chloride 0.9 % 100 mL IVPB  Status:  Discontinued        2 g 200 mL/hr over 30 Minutes Intravenous Every 12 hours 10/18/20 0414 10/28/20 1112   10/18/20 1000  dapsone tablet 100 mg        100 mg Oral Daily 10/18/20 0425     10/18/20 1000  bictegravir-emtricitabine-tenofovir AF (BIKTARVY) 50-200-25 MG per tablet 1 tablet  Status:  Discontinued        1 tablet Oral Daily 10/18/20 0610 11/07/20 1451   10/18/20 0415  metroNIDAZOLE (FLAGYL) IVPB 500 mg  Status:  Discontinued        500 mg 100 mL/hr over 60 Minutes Intravenous Every 8 hours 10/18/20 0404 10/19/20 1347   10/18/20 0415  ceFEPIme (MAXIPIME) 2 g in sodium chloride 0.9 % 100 mL  IVPB        2 g 200 mL/hr over 30 Minutes Intravenous  Once 10/18/20 0414 10/18/20 0500   10/18/20 0345  vancomycin (VANCOREADY) IVPB 2000 mg/400 mL        2,000 mg 200 mL/hr over 120 Minutes Intravenous  Once 10/18/20 0335 10/18/20 0608       PRN meds: acetaminophen, alum & mag hydroxide-simeth, bisacodyl, diphenoxylate-atropine, guaiFENesin-dextromethorphan, hydrALAZINE, HYDROmorphone (DILAUDID) injection, labetalol, magnesium sulfate bolus IVPB, metoprolol tartrate, ondansetron, oxyCODONE, oxyCODONE, phenol, polyethylene glycol, potassium chloride   Objective: Vitals:   12/02/20 2217 12/03/20 0804  BP: 115/87 114/70  Pulse: (!) 105 99  Resp: 17 16  Temp: 98.7 F (37.1 C) 99 F (37.2 C)  SpO2: 99% 100%    Intake/Output Summary (Last 24 hours) at 12/03/2020 0922 Last data filed at 12/03/2020 0831 Gross per 24 hour  Intake 2016.27 ml  Output 2600 ml  Net -583.73 ml    Filed Weights   11/20/20 0618 11/23/20 0618 11/23/20 2144  Weight: 117.3 kg 121.4 kg 121.4 kg   Weight change:  Body mass index is 38.4 kg/m.   Physical Exam: General exam: Pleasant, middle-aged African-American male.  Mild to moderate distress because of pain Skin: No rashes, lesions or ulcers. HEENT: Atraumatic, normocephalic, no obvious bleeding Lungs: Clear to auscultation bilaterally. CVS: Regular rate and rhythm, no murmur GI/Abd soft, nontender, nondistended, bowel sound present CNS: Alert, awake, oriented x3 Psychiatry: Mood appropriate Extremities: Prior right BKA status. New left BKA with bandage around lower leg.  Not bleeding at this time.  Data Review: I have personally reviewed the laboratory data and studies available.  Recent Labs  Lab 11/29/20 0721 11/30/20 0457 11/30/20 1500 11/30/20 2002 12/01/20 0232 12/02/20 0040 12/03/20 0148  WBC 11.1* 10.9*  --   --  9.0 10.0 16.1*  NEUTROABS 8.3* 7.8*  --   --  6.0 7.4 14.0*  HGB 7.4* 6.5* 7.2* 7.9* 8.8* 8.1* 7.1*  HCT 22.4* 20.3*  22.1* 24.3* 26.8* 24.7* 21.1*  MCV 90.7 92.3  --   --  88.7 90.1 89.8  PLT 509* 475*  --   --  459* 444* 404*    Recent Labs  Lab 11/29/20 0721 11/30/20 0457 12/01/20 0232 12/02/20 0040 12/03/20 0148  NA 133* 134*  133* 134* 130*  K 4.7 4.6 4.6 5.1 4.9  CL 101 101 103 104 100  CO2 '25 22 22 23 '$ 20*  GLUCOSE 127* 121* 141* 122* 160*  BUN 29* 29* 27* 24* 25*  CREATININE 2.38* 2.30* 2.03* 2.04* 2.24*  CALCIUM 8.6* 8.8* 8.7* 8.7* 8.3*  MG 1.9 1.9 2.0 1.9 1.8  PHOS 3.9 3.9 3.7 4.1 3.0     F/u labs ordered Unresulted Labs (From admission, onward)     Start     Ordered   12/03/20 [id number]  Basic metabolic panel  Daily,   R     Question:  Specimen collection method  Answer:  Lab=Lab collect   12/02/20 1134   12/03/20 0500  CBC  Daily,   R     Question:  Specimen collection method  Answer:  Lab=Lab collect   12/02/20 1134   11/21/20 0500  CBC with Differential/Platelet  Daily,   R     Question:  Specimen collection method  Answer:  Lab=Lab collect   11/20/20 1010   11/21/20 0500  Magnesium  Daily,   R     Question:  Specimen collection method  Answer:  Lab=Lab collect   11/20/20 1010   11/02/20 0500  Renal function panel  Daily,   R     Question:  Specimen collection method  Answer:  Lab=Lab collect   11/01/20 1153            Signed, Terrilee Croak, MD [facility] 12/03/2020

## 2020-12-04 DIAGNOSIS — N179 Acute kidney failure, unspecified: Secondary | ICD-10-CM | POA: Diagnosis not present

## 2020-12-04 LAB — CBC WITH DIFFERENTIAL/PLATELET
Abs Immature Granulocytes: 0.22 10*3/uL — ABNORMAL HIGH (ref 0.00–0.07)
Basophils Absolute: 0 10*3/uL (ref 0.0–0.1)
Basophils Relative: 0 %
Eosinophils Absolute: 0.1 10*3/uL (ref 0.0–0.5)
Eosinophils Relative: 1 %
HCT: 19.5 % — ABNORMAL LOW (ref 39.0–52.0)
Hemoglobin: 6.2 g/dL — CL (ref 13.0–17.0)
Immature Granulocytes: 2 %
Lymphocytes Relative: 12 %
Lymphs Abs: 1.7 10*3/uL (ref 0.7–4.0)
MCH: 29 pg (ref 26.0–34.0)
MCHC: 31.8 g/dL (ref 30.0–36.0)
MCV: 91.1 fL (ref 80.0–100.0)
Monocytes Absolute: 1.3 10*3/uL — ABNORMAL HIGH (ref 0.1–1.0)
Monocytes Relative: 9 %
Neutro Abs: 10.7 10*3/uL — ABNORMAL HIGH (ref 1.7–7.7)
Neutrophils Relative %: 76 %
Platelets: 390 10*3/uL (ref 150–400)
RBC: 2.14 MIL/uL — ABNORMAL LOW (ref 4.22–5.81)
RDW: 14.8 % (ref 11.5–15.5)
WBC: 14.1 10*3/uL — ABNORMAL HIGH (ref 4.0–10.5)
nRBC: 0 % (ref 0.0–0.2)

## 2020-12-04 LAB — RENAL FUNCTION PANEL
Albumin: 2.2 g/dL — ABNORMAL LOW (ref 3.5–5.0)
Albumin: 2.2 g/dL — ABNORMAL LOW (ref 3.5–5.0)
Anion gap: 10 (ref 5–15)
Anion gap: 8 (ref 5–15)
BUN: 29 mg/dL — ABNORMAL HIGH (ref 6–20)
BUN: 27 mg/dL — ABNORMAL HIGH (ref 6–20)
CO2: 21 mmol/L — ABNORMAL LOW (ref 22–32)
CO2: 21 mmol/L — ABNORMAL LOW (ref 22–32)
Calcium: 8.7 mg/dL — ABNORMAL LOW (ref 8.9–10.3)
Calcium: 8.4 mg/dL — ABNORMAL LOW (ref 8.9–10.3)
Chloride: 102 mmol/L (ref 98–111)
Chloride: 103 mmol/L (ref 98–111)
Creatinine, Ser: 2.49 mg/dL — ABNORMAL HIGH (ref 0.61–1.24)
Creatinine, Ser: 2.15 mg/dL — ABNORMAL HIGH (ref 0.61–1.24)
GFR, Estimated: 30 mL/min — ABNORMAL LOW (ref >60)
GFR, Estimated: 35 mL/min — ABNORMAL LOW (ref >60)
Glucose, Bld: 131 mg/dL — ABNORMAL HIGH (ref 70–99)
Glucose, Bld: 122 mg/dL — ABNORMAL HIGH (ref 70–99)
Phosphorus: 3.7 mg/dL (ref 2.5–4.6)
Phosphorus: 4.2 mg/dL (ref 2.5–4.6)
Potassium: 4.8 mmol/L (ref 3.5–5.1)
Potassium: 4.5 mmol/L (ref 3.5–5.1)
Sodium: 133 mmol/L — ABNORMAL LOW (ref 135–145)
Sodium: 132 mmol/L — ABNORMAL LOW (ref 135–145)

## 2020-12-04 LAB — HEMOGLOBIN AND HEMATOCRIT, BLOOD
HCT: 21.3 % — ABNORMAL LOW (ref 39.0–52.0)
HCT: 24.8 % — ABNORMAL LOW (ref 39.0–52.0)
Hemoglobin: 6.9 g/dL — CL (ref 13.0–17.0)
Hemoglobin: 8 g/dL — ABNORMAL LOW (ref 13.0–17.0)

## 2020-12-04 LAB — MAGNESIUM: Magnesium: 2 mg/dL (ref 1.7–2.4)

## 2020-12-04 LAB — GLUCOSE, CAPILLARY
Glucose-Capillary: 128 mg/dL — ABNORMAL HIGH (ref 70–99)
Glucose-Capillary: 131 mg/dL — ABNORMAL HIGH (ref 70–99)
Glucose-Capillary: 119 mg/dL — ABNORMAL HIGH (ref 70–99)
Glucose-Capillary: 124 mg/dL — ABNORMAL HIGH (ref 70–99)

## 2020-12-04 LAB — PREPARE RBC (CROSSMATCH)

## 2020-12-04 MED ORDER — SODIUM CHLORIDE 0.9% IV SOLUTION: Freq: Once | INTRAVENOUS | Status: DC

## 2020-12-04 MED ORDER — SODIUM CHLORIDE 0.9% IV SOLUTION: Freq: Once | INTRAVENOUS | Status: AC

## 2020-12-04 NOTE — TOC Progression Note (Signed)
Transition of Care [facility]) - Progression Note    Patient Details  Name: Keith [name] MRN: [name] Date of Birth: June 06, 1965  [facility] CM/SW Contact  Milinda Antis, [place] Phone Number: 12/04/2020, 2:51 PM  Clinical Narrative:    CSW received a consult for SNF placement at time of d/c.  CSW completed a chart review as this patient was transferred from another floor.  A SNF workup has already been completed and 3 facilities have accepted the patient.  According to previous notes, the patient declined going to 2 of the facilities.    CSW reached out to [facility] (final SNF bed offer) to inquire about whether the facility can still accept the patient.  The facility is reviewing their bed availability and will inform CSW of their final decision.         Expected Discharge Plan: Skilled Nursing Facility Barriers to Discharge: SNF Pending bed offer  Expected Discharge Plan and Services Expected Discharge Plan: [place] In-house Referral: Clinical Social Work Discharge Planning Services: CM Consult Post Acute Care Choice: [place] arrangements for the past 2 months: Single Family Home                                       Social Determinants of Health (SDOH) Interventions    Readmission Risk Interventions Readmission Risk Prevention Plan 10/21/2020 11/13/2018 11/13/2018  Transportation Screening Complete - Complete  PCP or Specialist Appt within 3-5 Days - - -  [place] or [place] Work Consult for [place] - - -  Medication Review Press photographer) Complete - Complete  PCP or Specialist appointment within 3-5 days of discharge Complete Complete -  [place] or Home Care Consult Complete Complete -  SW Recovery Care/Counseling Consult Complete Complete -  Palliative Care Screening Not Applicable Not Applicable -  [place] Complete  Not Applicable -  Some encounter information is confidential and restricted. Go to Review Flowsheets activity to see all data.  Some recent data might be hidden

## 2020-12-04 NOTE — Plan of Care (Addendum)
1 unit blood complete  Patient refused scheduled oxycodone and stated he only wanted dilaudid. Patient calls out for pain medication, yet falls right back to sleep. Patient states he was trying to sleep the pain away. Patient laughing and carrying on on the phone and still states pain 20 out of 10. When patient receives 2 mg dilaudid, patient goes right to sleep. Will continue to monitor.  Date and time results received: 12/04/20  Critical Value: HgB 6.9 Name of Provider Notified: Dr. Pietro Cassis Orders Received? Or Actions Taken?: 1 unit RBC  2nd unit blood started  Problem: Clinical Measurements: Goal: Ability to maintain clinical measurements within normal limits will improve Outcome: Progressing   Problem: Activity: Goal: Risk for activity intolerance will decrease Outcome: Progressing   Problem: Pain Managment: Goal: General experience of comfort will improve Outcome: Progressing   Problem: Safety: Goal: Ability to remain free from injury will improve Outcome: Progressing   Problem: Skin Integrity: Goal: Risk for impaired skin integrity will decrease Outcome: Progressing

## 2020-12-04 NOTE — Progress Notes (Signed)
Patient is status post below-knee amputation revision.  He is comfortable in bed he said that he has been sliding himself to his chair.   He does have a hemoglobin of 6.2 this morning we will order 1 unit of blood per [name].  VAC is functioning well 0 cc in the canister plan for transfer to nursing facility

## 2020-12-04 NOTE — Progress Notes (Signed)
PROGRESS NOTE  Keith [name]  DOB: [birth year]  [name], NP [id number]  DOA: 10/17/2020  LOS: 16 days  Hospital Day: 43   Chief Complaint  Patient presents with   Foot Ulcer    Brief narrative: Keith [name] is a 55 y.o. male with PMH significant for DM2, HTN, HLD, CAD, CKD 3B, HIV, right BKA status.  Patient was sent to the ED on 7/12 by his orthopedic surgeon due to concern of left heel ulcer that failed outpatient therapy.   Subsequently, MRI confirmed osteomyelitis.  He underwent left transtibial amputation by [name].  During the hospitalization, he had worsening renal function.  Nephrology was consulted.  He had temporary dialysis catheter placed and dialysis was started.  Later, he developed hematuria and required fulguration and CBI along with blood transfusions.  Renal function improved; dialysis was stopped and nephrology signed off.  Subsequently, his wound dehisced with bleeding requiring blood transfusion.   See below for details.  Subjective: Patient was seen and examined in the morning. Lying on bed.  Not in distress.  Pain controlled.  No more bleeding.  Labs from this morning showed hemoglobin down to 6.2 and creatinine up to 2.49.  Assessment/Plan: Acute kidney injury with metabolic acidosis  -Patient presented with worsening renal functions and started on dialysis during this admission but was then subsequently stopped and nephrology signed off.  AKI was felt to be due to ATN.  Tunneled dialysis catheter has already been removed. -Renal ultrasound was negative for hydronephrosis. -Creatinine gradually improved but has been trending up in last 48 hours.  It seems to me that patient could not continue hydration last 24 hours because of loss of IV access.  I would reestablish IV access and resume IV saline at this time. Recent Labs    11/25/20 0734 11/26/20 0422 11/27/20 0345 11/28/20 1630 11/29/20 0721 11/30/20 0457 12/01/20 0232  12/02/20 0040 12/03/20 0148 12/04/20 0305  BUN 23* 24* 25* 27* 29* 29* 27* 24* 25* 29*  CREATININE 1.97* 1.95* 2.05* 2.07* 2.38* 2.30* 2.03* 2.04* 2.24* 2.49*  CO2 21* '22 23 24 25 22 22 23 '$ 20* 21*    Left heel osteomyelitis Recurrent wound dehiscence and bleeding -Patient has a previous right BKA status. -7/22, patient had transtibial amputation which failed leading to wound dehiscence and bleeding.  -8/12, patient had revision amputation below-knee.  Patient continued to have poor wound healing and bleeding.   -8/27, patient had revision left BKA. -Currently pain controlled with Dilaudid 2 mg every 6 hours and oxycodone as needed.  Hematuria Acute urinary retention -11/08/2020 -Foley catheter placement with drainage of clots.  Urology was consulted  -8/4 - underwent cystoscopy with fulguration and started on CBI and Proscar.  Subsequently CBI was discontinued. -8/15-Foley catheter discontinued. -8/22-patient had another episode of hematuria and clots. Urology recommended Proscar, Uroxatrol and IV hydration. Hematuria is improving now.   Anemia due to acute blood loss from bladder and left stump -Hemoglobin down to 6.2 today.  1 unit of PRBC ordered.  13 units of PRBC transfused so far this admission.  -Continue monitor hemoglobin. Recent Labs    04/26/20 1228 04/27/20 0802 11/30/20 2002 12/01/20 0232 12/02/20 0040 12/03/20 0148 12/04/20 0305  HGB 10.7*   < > 7.9* 8.8* 8.1* 7.1* 6.2*  MCV 84.5   < >  --  88.7 90.1 89.8 91.1  VITAMINB12 451  --   --   --   --   --   --  FOLATE 8.7  --   --   --   --   --   --   FERRITIN 347*  --   --   --   --   --   --   TIBC 276  --   --   --   --   --   --   IRON 71  --   --   --   --   --   --   RETICCTPCT 1.8  --   --   --   --   --   --    < > = values in this interval not displayed.   Uncontrolled type 2 diabetes mellitus Hyperglycemia -A1c was 13.9 on May 2022.  Repeat A1c on 8/24 was at 5.9.  Wonder if it is truly low because of  diet control in the hospital or spuriously low due to frequent blood loss and multiple transfusions.   -Currently on Jardiance 10 mg daily and sliding scale insulin.  Blood sugar level yesterday was elevated to over 200 blood previously was in the 100s.  Continue to monitor on the same regimen for now. Recent Labs  Lab 12/03/20 0618 12/03/20 1114 12/03/20 1616 12/03/20 2102 12/04/20 0630  GLUCAP 141* 144* 168* 152* 128*      CAD status post stenting in 03/2020 -Continue aspirin.  Because of recurrent bleeding, Brilinta was discontinued by cardiology on 11/19/2020.   -Once bleeding stabilizes, will plan to resume Brilinta.  Continue Vascepa and Lipitor  HIV -Continue current regimen of antiretrovirals and dapsone as per ID.   -Outpatient follow-up with ID   Mobility: PT eval obtained.  SNF recommended Code Status:   Code Status: Full Code  Nutritional status: Body mass index is 38.4 kg/m.     Diet:  Diet Order             Diet Carb Modified Fluid consistency: Thin; Room service appropriate? Yes  Diet effective now                  DVT prophylaxis: Not on heparin products because of frequent bleeding.  Bilateral BKA status, unable to use SCDs SCD's Start: 12/02/20 1135 Antimicrobials: None Fluid: Continue NS at 38 Consultants: Orthopedics Family Communication: None at bedside  Status is: Inpatient  Remains inpatient appropriate because: POD2 after revision of amputation on 8/27.  Dispo: The patient is from: Home              Anticipated d/c is to: SNF when cleared by orthopedics              Patient currently is not medically stable to d/c.   Difficult to place patient No     Infusions:   sodium chloride 75 mL/hr at 12/03/20 [id number]   magnesium sulfate bolus IVPB      Scheduled Meds:  alfuzosin  10 mg Oral Q breakfast   allopurinol  100 mg Oral Daily   vitamin C  1,000 mg Oral Daily   aspirin EC  81 mg Oral Daily   atorvastatin  80 mg Oral Daily    Chlorhexidine Gluconate Cloth  6 each Topical Q0600   dapsone  100 mg Oral Daily   docusate sodium  100 mg Oral Daily   dolutegravir  50 mg Oral Daily   doravirine  100 mg Oral Daily   empagliflozin  10 mg Oral Daily   escitalopram  20 mg Oral Daily   finasteride  5  mg Oral Daily   gabapentin  100 mg Oral TID   icosapent Ethyl  2 g Oral BID   insulin aspart  0-5 Units Subcutaneous QHS   lamiVUDine  300 mg Oral Daily   mupirocin ointment  1 application Nasal BID   nutrition supplement (JUVEN)  1 packet Oral BID BM   oxybutynin  2.5 mg Oral BID   oxyCODONE  10 mg Oral QID   pantoprazole  40 mg Oral Daily   senna  2 tablet Oral QHS   zinc sulfate  220 mg Oral Daily    Antimicrobials: Anti-infectives (From admission, onward)    Start     Dose/Rate Route Frequency Ordered Stop   12/02/20 1230  ceFAZolin (ANCEF) IVPB 2g/100 mL premix        2 g 200 mL/hr over 30 Minutes Intravenous Every 8 hours 12/02/20 1134 12/02/20 2245   12/02/20 0600  ceFAZolin (ANCEF) IVPB 3g/100 mL premix        3 g 200 mL/hr over 30 Minutes Intravenous On call to O.R. 12/01/20 1909 12/02/20 0950   12/01/20 0600  ceFAZolin (ANCEF) IVPB 2g/100 mL premix  Status:  Discontinued        2 g 200 mL/hr over 30 Minutes Intravenous On call to O.R. 11/30/20 1817 11/30/20 1819   12/01/20 0600  ceFAZolin (ANCEF) IVPB 3g/100 mL premix        3 g 200 mL/hr over 30 Minutes Intravenous On call to O.R. 11/30/20 1820 12/02/20 0559   11/18/20 0100  ceFAZolin (ANCEF) IVPB 2g/100 mL premix        2 g 200 mL/hr over 30 Minutes Intravenous Every 8 hours 11/17/20 2045 11/18/20 1038   11/17/20 0600  ceFAZolin (ANCEF) IVPB 3g/100 mL premix        3 g 200 mL/hr over 30 Minutes Intravenous On call to O.R. 11/16/20 1941 11/17/20 1901   11/11/20 1200  vancomycin (VANCOREADY) IVPB 500 mg/100 mL  Status:  Discontinued        500 mg 100 mL/hr over 60 Minutes Intravenous Every T-Th-Sa (Hemodialysis) 11/09/20 1414 11/11/20 1328    11/10/20 0000  Dolutegravir-lamiVUDine (DOVATO) 50-300 MG TABS        1 tablet Oral Daily 11/10/20 1048     11/10/20 0000  doravirine (PIFELTRO) 100 MG TABS tablet        100 mg Oral Daily 11/10/20 1048     11/08/20 1000  dolutegravir (TIVICAY) tablet 50 mg        50 mg Oral Daily 11/07/20 1451     11/08/20 1000  lamiVUDine (EPIVIR) tablet 300 mg        300 mg Oral Daily 11/07/20 1451     11/08/20 1000  doravirine (PIFELTRO) tablet 100 mg        100 mg Oral Daily 11/07/20 1451     11/02/20 1130  ceFAZolin (ANCEF) IVPB 2g/100 mL premix        2 g 200 mL/hr over 30 Minutes Intravenous To Radiology 11/02/20 1042 11/02/20 1704   10/27/20 1245  ceFAZolin (ANCEF) IVPB 3g/100 mL premix        3 g 200 mL/hr over 30 Minutes Intravenous On call to O.R. 10/27/20 0755 10/27/20 1608   10/20/20 1415  linezolid (ZYVOX) tablet 600 mg  Status:  Discontinued        600 mg Oral Every 12 hours 10/20/20 1326 10/28/20 1115   10/19/20 1445  metroNIDAZOLE (FLAGYL) tablet 500 mg  Status:  Discontinued  500 mg Oral Every 12 hours 10/19/20 1347 10/28/20 1115   10/18/20 1600  ceFEPIme (MAXIPIME) 2 g in sodium chloride 0.9 % 100 mL IVPB  Status:  Discontinued        2 g 200 mL/hr over 30 Minutes Intravenous Every 12 hours 10/18/20 0414 10/28/20 1112   10/18/20 1000  dapsone tablet 100 mg        100 mg Oral Daily 10/18/20 0425     10/18/20 1000  bictegravir-emtricitabine-tenofovir AF (BIKTARVY) 50-200-25 MG per tablet 1 tablet  Status:  Discontinued        1 tablet Oral Daily 10/18/20 0610 11/07/20 1451   10/18/20 0415  metroNIDAZOLE (FLAGYL) IVPB 500 mg  Status:  Discontinued        500 mg 100 mL/hr over 60 Minutes Intravenous Every 8 hours 10/18/20 0404 10/19/20 1347   10/18/20 0415  ceFEPIme (MAXIPIME) 2 g in sodium chloride 0.9 % 100 mL IVPB        2 g 200 mL/hr over 30 Minutes Intravenous  Once 10/18/20 0414 10/18/20 0500   10/18/20 0345  vancomycin (VANCOREADY) IVPB 2000 mg/400 mL        2,000  mg 200 mL/hr over 120 Minutes Intravenous  Once 10/18/20 0335 10/18/20 0608       PRN meds: acetaminophen, alum & mag hydroxide-simeth, bisacodyl, diphenoxylate-atropine, guaiFENesin-dextromethorphan, hydrALAZINE, HYDROmorphone (DILAUDID) injection, labetalol, magnesium sulfate bolus IVPB, metoprolol tartrate, ondansetron, oxyCODONE, oxyCODONE, phenol, polyethylene glycol, potassium chloride   Objective: Vitals:   12/04/20 0800 12/04/20 1005  BP: (!) 98/50 125/76  Pulse: 92 89  Resp: 14 16  Temp: 98.6 F (37 C) 97.8 F (36.6 C)  SpO2: 99% 100%    Intake/Output Summary (Last 24 hours) at 12/04/2020 1034 Last data filed at 12/04/2020 0730 Gross per 24 hour  Intake 120 ml  Output 1500 ml  Net -1380 ml    Filed Weights   11/20/20 0618 11/23/20 0618 11/23/20 2144  Weight: 117.3 kg 121.4 kg 121.4 kg   Weight change:  Body mass index is 38.4 kg/m.   Physical Exam: General exam: Pleasant, middle-aged African-American male.  Not in distress  skin: No rashes, lesions or ulcers. HEENT: Atraumatic, normocephalic, no obvious bleeding Lungs: Clear to auscultation bilaterally CVS: Regular rate and rhythm, no murmur GI/Abd soft, nontender, nondistended, bowel sound present CNS: Alert, awake, oriented x3 Psychiatry: Mood appropriate Extremities: Prior right BKA status. New left BKA with bandage around lower leg.  Not bleeding at this time.  Data Review: I have personally reviewed the laboratory data and studies available.  Recent Labs  Lab 11/30/20 0457 11/30/20 1500 11/30/20 2002 12/01/20 0232 12/02/20 0040 12/03/20 0148 12/04/20 0305  WBC 10.9*  --   --  9.0 10.0 16.1* 14.1*  NEUTROABS 7.8*  --   --  6.0 7.4 14.0* 10.7*  HGB 6.5*   < > 7.9* 8.8* 8.1* 7.1* 6.2*  HCT 20.3*   < > 24.3* 26.8* 24.7* 21.1* 19.5*  MCV 92.3  --   --  88.7 90.1 89.8 91.1  PLT 475*  --   --  459* 444* 404* 390   < > = values in this interval not displayed.    Recent Labs  Lab 11/30/20 0457  12/01/20 0232 12/02/20 0040 12/03/20 0148 12/04/20 0305  NA 134* 133* 134* 130* 133*  K 4.6 4.6 5.1 4.9 4.8  CL 101 103 104 100 102  CO2 '22 22 23 '$ 20* 21*  GLUCOSE 121* 141* 122* 160* 131*  BUN 29* 27* 24* 25* 29*  CREATININE 2.30* 2.03* 2.04* 2.24* 2.49*  CALCIUM 8.8* 8.7* 8.7* 8.3* 8.7*  MG 1.9 2.0 1.9 1.8 2.0  PHOS 3.9 3.7 4.1 3.0 3.7     F/u labs ordered Unresulted Labs (From admission, onward)     Start     Ordered   12/03/20 0500  CBC  Daily,   R     Question:  Specimen collection method  Answer:  Lab=Lab collect   12/02/20 1134   11/21/20 0500  CBC with Differential/Platelet  Daily,   R     Question:  Specimen collection method  Answer:  Lab=Lab collect   11/20/20 1010   11/21/20 0500  Magnesium  Daily,   R     Question:  Specimen collection method  Answer:  Lab=Lab collect   11/20/20 1010   11/02/20 0500  Renal function panel  Daily,   R     Question:  Specimen collection method  Answer:  Lab=Lab collect   11/01/20 1153            Signed, Terrilee Croak, MD [facility] 12/04/2020

## 2020-12-04 NOTE — Progress Notes (Signed)
Inpatient Rehab Admissions Coordinator:   PT/OT are recommending SNF for this pt. I will not pursue CIR admission.   Clemens Catholic, [place], [place] Admissions Coordinator  [phone] ([place]) [phone] (office)

## 2020-12-05 DIAGNOSIS — N179 Acute kidney failure, unspecified: Secondary | ICD-10-CM | POA: Diagnosis not present

## 2020-12-05 LAB — CBC WITH DIFFERENTIAL/PLATELET
Abs Immature Granulocytes: 0.17 10*3/uL — ABNORMAL HIGH (ref 0.00–0.07)
Basophils Absolute: 0 10*3/uL (ref 0.0–0.1)
Basophils Relative: 0 %
Eosinophils Absolute: 0.3 10*3/uL (ref 0.0–0.5)
Eosinophils Relative: 2 %
HCT: 24.9 % — ABNORMAL LOW (ref 39.0–52.0)
Hemoglobin: 8.1 g/dL — ABNORMAL LOW (ref 13.0–17.0)
Immature Granulocytes: 1 %
Lymphocytes Relative: 12 %
Lymphs Abs: 1.5 10*3/uL (ref 0.7–4.0)
MCH: 29.7 pg (ref 26.0–34.0)
MCHC: 32.5 g/dL (ref 30.0–36.0)
MCV: 91.2 fL (ref 80.0–100.0)
Monocytes Absolute: 1.1 10*3/uL — ABNORMAL HIGH (ref 0.1–1.0)
Monocytes Relative: 9 %
Neutro Abs: 9.7 10*3/uL — ABNORMAL HIGH (ref 1.7–7.7)
Neutrophils Relative %: 76 %
Platelets: 356 10*3/uL (ref 150–400)
RBC: 2.73 MIL/uL — ABNORMAL LOW (ref 4.22–5.81)
RDW: 14.5 % (ref 11.5–15.5)
WBC: 12.8 10*3/uL — ABNORMAL HIGH (ref 4.0–10.5)
nRBC: 0 % (ref 0.0–0.2)

## 2020-12-05 LAB — BPAM RBC
Blood Product Expiration Date: 202209282359
Blood Product Expiration Date: 202209292359
ISSUE DATE / TIME: 202208291011
ISSUE DATE / TIME: 202208291658
Unit Type and Rh: 5100
Unit Type and Rh: 5100

## 2020-12-05 LAB — GLUCOSE, CAPILLARY
Glucose-Capillary: 136 mg/dL — ABNORMAL HIGH (ref 70–99)
Glucose-Capillary: 123 mg/dL — ABNORMAL HIGH (ref 70–99)
Glucose-Capillary: 136 mg/dL — ABNORMAL HIGH (ref 70–99)
Glucose-Capillary: 143 mg/dL — ABNORMAL HIGH (ref 70–99)

## 2020-12-05 LAB — TYPE AND SCREEN
ABO/RH(D): O POS
Antibody Screen: NEGATIVE
Unit division: 0
Unit division: 0

## 2020-12-05 LAB — MAGNESIUM: Magnesium: 2 mg/dL (ref 1.7–2.4)

## 2020-12-05 MED ORDER — PROSOURCE PLUS PO LIQD
30.0000 mL | Freq: Two times a day (BID) | ORAL | Status: DC
  Filled 2020-12-05 (×7): qty 30

## 2020-12-05 MED ORDER — ADULT MULTIVITAMIN W/MINERALS CH
1.0000 | ORAL_TABLET | ORAL | Status: DC
  Administered 2020-12-05 – 2020-12-27 (×22): 1 via ORAL
  Filled 2020-12-05 (×23): qty 1

## 2020-12-05 MED ORDER — HYDROMORPHONE HCL 1 MG/ML IJ SOLN
1.0000 mg | Freq: Four times a day (QID) | INTRAMUSCULAR | Status: DC | PRN
Start: 2020-12-05 — End: 2020-12-06
  Administered 2020-12-05 – 2020-12-06 (×2): 1 mg via INTRAVENOUS
  Filled 2020-12-05 (×2): qty 1

## 2020-12-05 NOTE — Progress Notes (Signed)
Occupational Therapy Treatment Patient Details Name: JACSON VIROLA MRN: [name] DOB: 16-Nov-1965 Today's Date: 12/05/2020    History of present illness 55 y.o. male  who is sent to the ED by his orthopedic surgery provider due to concern for left heel ulcer that had failed outpatient therapy, now s/p L BKA 10/27/20. s/p REVISIONs L AMPUTATION BELOW KNEE on 8/12, and 8/27;  with medical history significant for HIV, CKD 3B, type 2 diabetes, hypertension, hyperlipidemia, coronary artery disease, status post right below the knee amputation   OT comments  Leiby is progressing well however continues to demonstrate self limiting behaviors. Pt was motivated to participate in therapy and get out of his room. Overall pt completed lateral scooting transfers with min guard-min A. Attempted to use slide board, but pt declined after 1st attempt. He propelled himself in the wc about 237f and required 3 rest breaks. Pt continues to benefit from OT acutely. D/c plan remains appropriate.    Follow Up Recommendations  SNF    Equipment Recommendations  None recommended by OT       Precautions / Restrictions Precautions Precautions: Fall Precaution Comments: B BKA's: R healed,  L BKA rev 8/27, RLE prosthesis Required Braces or Orthoses: Other Brace Other Brace: limb protector on left Restrictions Weight Bearing Restrictions: Yes RLE Weight Bearing: Weight bearing as tolerated LLE Weight Bearing: Non weight bearing       Mobility Bed Mobility Overal bed mobility: Needs Assistance Bed Mobility: Supine to Sit     Supine to sit: Modified independent (Device/Increase time);HOB elevated          Transfers Overall transfer level: Needs assistance Equipment used: Sliding board Transfers: Lateral/Scoot Transfers          Lateral/Scoot Transfers: Min guard General transfer comment: attempted to encourage pt to use slide board this session for skin integrity adn saety. after 1st attempt, pt  declined    Balance Overall balance assessment: Needs assistance Sitting-balance support: No upper extremity supported;Feet supported Sitting balance-Leahy Scale: Good                                     ADL either performed or assessed with clinical judgement   ADL Overall ADL's : Needs assistance/impaired                                     Functional mobility during ADLs: Minimal assistance;Cueing for safety;Cueing for sequencing;Wheelchair General ADL Comments: Session focused on funcitonal mobility and safe transfer techniques      Cognition Arousal/Alertness: Awake/alert Behavior During Therapy: WFL for tasks assessed/performed;Impulsive Overall Cognitive Status: Impaired/Different from baseline Area of Impairment: Awareness;Safety/judgement;Following commands;Problem solving                   Current Attention Level: Selective   Following Commands: Follows one step commands inconsistently;Follows one step commands with increased time Safety/Judgement: Decreased awareness of safety;Decreased awareness of deficits Awareness: Emergent Problem Solving: Slow processing;Difficulty sequencing;Requires verbal cues General Comments: Pt with poor problem solving and insight into safety, especially in regard to transfers. He shuts down any cueing and suggestions for different transfer techniques        Exercises     Shoulder Instructions       General Comments VSS on RA    Pertinent Vitals/ Pain  Pain Assessment: No/denies pain Pain Score: 0-No pain Pain Intervention(s): Limited activity within patient's tolerance;Monitored during session   Frequency  Min 2X/week        Progress Toward Goals  OT Goals(current goals can now be found in the care plan section)  Progress towards OT goals: Progressing toward goals  Acute Rehab OT Goals Patient Stated Goal: get out of the room OT Goal Formulation: With patient Time For  Goal Achievement: 12/07/20 Potential to Achieve Goals: Good ADL Goals Pt Will Perform Lower Body Bathing: with mod assist;sit to/from stand Pt Will Perform Upper Body Dressing: with set-up;sitting Pt Will Perform Lower Body Dressing: sit to/from stand;with mod assist Pt Will Transfer to Toilet: with min assist;stand pivot transfer Pt Will Perform Toileting - Clothing Manipulation and hygiene: with mod assist;sit to/from stand  Plan Discharge plan remains appropriate;Frequency remains appropriate    Co-evaluation                 AM-PAC OT "6 Clicks" Daily Activity     Outcome Measure   Help from another person eating meals?: None Help from another person taking care of personal grooming?: A Little Help from another person toileting, which includes using toliet, bedpan, or urinal?: A Lot Help from another person bathing (including washing, rinsing, drying)?: A Lot Help from another person to put on and taking off regular upper body clothing?: A Little Help from another person to put on and taking off regular lower body clothing?: A Lot 6 Click Score: 16    End of Session Equipment Utilized During Treatment:  (wheelchair)  OT Visit Diagnosis: Other abnormalities of gait and mobility (R26.89);Muscle weakness (generalized) (M62.81);Pain   Activity Tolerance Patient tolerated treatment well   Patient Left with call bell/phone within reach;in chair;with chair alarm set   Nurse Communication Mobility status        Time: [id number] OT Time Calculation (min): 23 min  Charges: OT General Charges $OT Visit: 1 Visit OT Treatments $Therapeutic Activity: 23-37 mins    Olaoluwa Grieder A Jeris Easterly 12/05/2020, 8:37 PM

## 2020-12-05 NOTE — TOC Progression Note (Signed)
Transition of Care [facility]) - Progression Note    Patient Details  Name: Keith [name] MRN: [name] Date of Birth: [birth year]  [facility] CM/SW Contact  Milinda Antis, LCSWA Phone Number: 12/05/2020, 12:21 PM  Clinical Narrative:    CSW and RNCM spoke with the patient.  The patient reports that he would like to go to [facility].  If [facility] is unable to accept the patient, the patient request to go home.  CSW contacted [facility] SNF and they are willing to accept the patient.  The facility has started insurance authorization.    Pending: insurance auth.   Expected Discharge Plan: Skilled Nursing Facility Barriers to Discharge: SNF Pending bed offer  Expected Discharge Plan and Services Expected Discharge Plan: [place] In-house Referral: Clinical Social Work Discharge Planning Services: CM Consult Post Acute Care Choice: [place] arrangements for the past 2 months: Single Family Home                                       Social Determinants of Health (SDOH) Interventions    Readmission Risk Interventions Readmission Risk Prevention Plan 10/21/2020 11/13/2018 11/13/2018  Transportation Screening Complete - Complete  PCP or Specialist Appt within 3-5 Days - - -  HRI or [place] Work Consult for [place] - - -  Medication Review Press photographer) Complete - Complete  PCP or Specialist appointment within 3-5 days of discharge Complete Complete -  [place] or Home Care Consult Complete Complete -  SW Recovery Care/Counseling Consult Complete Complete -  Palliative Care Screening Not Applicable Not Applicable -  [place] Complete Not Applicable -  Some encounter information is confidential and restricted. Go to Review Flowsheets activity to see all data.  Some recent data might be hidden

## 2020-12-05 NOTE — Plan of Care (Signed)
  Problem: Skin Integrity: Goal: Skin integrity will improve Outcome: Progressing   Problem: Activity: Goal: Risk for activity intolerance will decrease Outcome: Progressing   Problem: Nutrition: Goal: Adequate nutrition will be maintained Outcome: Progressing   Problem: Elimination: Goal: Will not experience complications related to bowel motility Outcome: Progressing   Problem: Pain Managment: Goal: General experience of comfort will improve Outcome: Progressing   Problem: Safety: Goal: Ability to remain free from injury will improve Outcome: Progressing   Problem: Skin Integrity: Goal: Risk for impaired skin integrity will decrease Outcome: Progressing

## 2020-12-05 NOTE — Progress Notes (Signed)
Initial Nutrition Assessment  DOCUMENTATION CODES:   Obesity unspecified  INTERVENTION:   -D/c Juven -Magic cup TID with meals, each supplement provides 290 kcal and 9 grams of protein  -MVI with minerals daily -30 ml Prosource Plus BID, each supplement provides 100 kcals and 15 grams protein  NUTRITION DIAGNOSIS:   Increased nutrient needs related to post-op healing as evidenced by estimated needs.  GOAL:   Patient will meet greater than or equal to 90% of their needs  MONITOR:   PO intake, Supplement acceptance, Labs, Weight trends, Skin, I & O's  REASON FOR ASSESSMENT:   LOS    ASSESSMENT:   Keith [name] is a 55 y.o. male with PMH significant for DM2, HTN, HLD, CAD, CKD 3B, HIV, right BKA status.  Pt admitted with lt heel osteomyelitis.  7/22- s/p lt BKA 8/12- s/p BKA revision 8/27- s/p BKA revision  Reviewed I/O's: +571 ml x 24 hours and -2.2 L since 11/21/20  UOP: 1.2 L x 24 hours   Pt reports fairly good appetite and tries to eat all of his meals "depending on what it is". Noted meal completion 25-100%. Noted pt snacking on cheese doodles at time of visit ("my brother gave them to me so I can watch my late night movies").   Reviewed wt hx; pt has experienced a 7.4% wt loss over the past year, which is not significant for time frame. Suspect some wt loss is related to amputations.   Discussed importance of good meal and supplement intake to promote healing. Pt is taking Juven, however, due to national shortage will d/c and adjust supplements to help optimize nutritional status.   Medications reviewed and include vitamin C, zinc sulfate, and senokot.   Labs reviewed: Na: 132, CBGS: 123-136 (inpatient orders for glycemic control are 0-5 units insulin aspart daily at bedtime).    NUTRITION - FOCUSED PHYSICAL EXAM:  Flowsheet Row Most Recent Value  Orbital Region No depletion  Upper Arm Region No depletion  Thoracic and Lumbar Region No depletion  Buccal  Region No depletion  Temple Region Mild depletion  Clavicle Bone Region No depletion  Clavicle and Acromion Bone Region No depletion  Scapular Bone Region No depletion  Dorsal Hand No depletion  Patellar Region No depletion  Anterior Thigh Region No depletion  Posterior Calf Region Unable to assess  Edema (RD Assessment) Mild  Hair Reviewed  Eyes Reviewed  Mouth Reviewed  Skin Reviewed  Nails Reviewed        Diet Order:   Diet Order             Diet Carb Modified Fluid consistency: Thin; Room service appropriate? Yes  Diet effective now                   EDUCATION NEEDS:   Education needs have been addressed  Skin:  Skin Assessment: Skin Integrity Issues: Skin Integrity Issues:: Wound VAC Wound Vac: lt BKA  Last BM:  12/02/20  Height:   Ht Readings from Last 1 Encounters:  10/17/20 '5\' 10"'$  (1.778 m)    Weight:   Wt Readings from Last 1 Encounters:  11/23/20 121.4 kg    Ideal Body Weight:  65.6 kg (adjusted for bilateral BKAs)  BMI:  Body mass index is 38.4 kg/m.  Estimated Nutritional Needs:   Kcal:  2000-2000  Protein:  100-115 grams  Fluid:  > 2 L    Loistine Chance, RD, LDN, [place] Registered Dietitian II Certified Diabetes Care and  Education Specialist Please refer to [facility] for RD and/or RD on-call/weekend/after hours pager

## 2020-12-05 NOTE — Progress Notes (Signed)
PROGRESS NOTE  ANTARES LOY  DOB: 14-Jan-1966  [name], NP [id number]  DOA: 10/17/2020  LOS: 108 days  Hospital Day: 59   Chief Complaint  Patient presents with   Foot Ulcer    Brief narrative: Keith [name] is a 55 y.o. male with PMH significant for DM2, HTN, HLD, CAD, CKD 3B, HIV, right BKA status.  Patient was sent to the ED on 7/12 by his orthopedic surgeon due to concern of left heel ulcer that failed outpatient therapy.   Subsequently, MRI confirmed osteomyelitis.  He underwent left transtibial amputation by [name].  During the hospitalization, he had worsening renal function.  Nephrology was consulted.  He had temporary dialysis catheter placed and dialysis was started.  Later, he developed hematuria and required fulguration and CBI along with blood transfusions.  Renal function improved; dialysis was stopped and nephrology signed off.  Subsequently, his wound dehisced with bleeding requiring blood transfusion.   See below for details.  Subjective: Patient was seen and examined in the morning. Lying on bed.  Not in distress.  No new symptoms.  Pain controlled. Labs this morning with creatinine improving down to 2.15, hemoglobin at 8.1.  Assessment/Plan: Acute kidney injury with metabolic acidosis  -Patient presented with worsening renal functions and started on dialysis during this admission but was then subsequently stopped and nephrology signed off.  AKI was felt to be due to ATN.  Tunneled dialysis catheter has already been removed. -Renal ultrasound was negative for hydronephrosis. -Creatinine better now at 2.15.  Monitor off IV fluid today.  If it starts to increase tomorrow, will plan to reinitiate IV fluid. Recent Labs    11/26/20 0422 11/27/20 0345 11/28/20 1630 11/29/20 0721 11/30/20 0457 12/01/20 0232 12/02/20 0040 12/03/20 0148 12/04/20 0305 12/04/20 2136  BUN 24* 25* 27* 29* 29* 27* 24* 25* 29* 27*  CREATININE 1.95* 2.05* 2.07*  2.38* 2.30* 2.03* 2.04* 2.24* 2.49* 2.15*  CO2 '22 23 24 25 22 22 23 '$ 20* 21* 21*   Left heel osteomyelitis Recurrent wound dehiscence and bleeding -Patient has a previous right BKA status. -7/22, patient had transtibial amputation which failed leading to wound dehiscence and bleeding.  -8/12, patient had revision amputation below-knee.  Patient continued to have poor wound healing and bleeding.   -8/27, patient had revision left BKA. -Currently pain controlled with Dilaudid 2 mg every 6 hours and oxycodone as needed.  Cut down on IV Dilaudid.  Hematuria Acute urinary retention -11/08/2020 -Foley catheter placement with drainage of clots.  Urology was consulted  -8/4 - underwent cystoscopy with fulguration and started on CBI and Proscar.  Subsequently CBI was discontinued. -8/15-Foley catheter discontinued. -8/22-patient had another episode of hematuria and clots. Urology recommended Proscar, Uroxatrol and IV hydration. Hematuria has now improved.   Anemia due to acute blood loss from bladder and left stump -Hemoglobin at 8.1 today.  13 units of PRBC transfused so far this admission.  -Continue monitor hemoglobin. Recent Labs    04/26/20 1228 04/27/20 0802 12/03/20 0148 12/04/20 0305 12/04/20 1457 12/04/20 2136 12/05/20 0314  HGB 10.7*   < > 7.1* 6.2* 6.9* 8.0* 8.1*  MCV 84.5   < > 89.8 91.1  --   --  91.2  VITAMINB12 451  --   --   --   --   --   --   FOLATE 8.7  --   --   --   --   --   --   FERRITIN  347*  --   --   --   --   --   --   TIBC 276  --   --   --   --   --   --   IRON 71  --   --   --   --   --   --   RETICCTPCT 1.8  --   --   --   --   --   --    < > = values in this interval not displayed.   Uncontrolled type 2 diabetes mellitus Hyperglycemia -A1c was 13.9 on May 2022.  Repeat A1c on 8/24 was at 5.9.  Wonder if it is truly low because of diet control in the hospital or spuriously low due to frequent blood loss and multiple transfusions.   -Currently on Jardiance  10 mg daily and sliding scale insulin.  Blood sugar is consistently under 150.   Recent Labs  Lab 12/04/20 1056 12/04/20 1614 12/04/20 2046 12/05/20 0647 12/05/20 1121  GLUCAP 131* 119* 124* 136* 123*     CAD status post stenting in 03/2020 -Continue aspirin.  Because of recurrent bleeding, Brilinta was discontinued by cardiology on 11/19/2020.   -Once bleeding stabilizes, will plan to resume Brilinta.  Continue Vascepa and Lipitor  HIV -Continue current regimen of antiretrovirals and dapsone as per ID.   -Outpatient follow-up with ID   Mobility: PT eval obtained.  SNF recommended Code Status:   Code Status: Full Code  Nutritional status: Body mass index is 38.4 kg/m.     Diet:  Diet Order             Diet Carb Modified Fluid consistency: Thin; Room service appropriate? Yes  Diet effective now                  DVT prophylaxis: Not on heparin products because of frequent bleeding.  Bilateral BKA status, unable to use SCDs SCD's Start: 12/02/20 1135 Antimicrobials: None Fluid: Not on IV fluid Consultants: Orthopedics Family Communication: None at bedside  Status is: Inpatient  Remains inpatient appropriate because: POD2 after revision of amputation on 8/27.  Dispo: The patient is from: Home              Anticipated d/c is to: SNF               Patient currently is medically stable to d/c.   Difficult to place patient No     Infusions:   magnesium sulfate bolus IVPB      Scheduled Meds:  (feeding supplement) PROSource Plus  30 mL Oral BID BM   alfuzosin  10 mg Oral Q breakfast   allopurinol  100 mg Oral Daily   vitamin C  1,000 mg Oral Daily   aspirin EC  81 mg Oral Daily   atorvastatin  80 mg Oral Daily   Chlorhexidine Gluconate Cloth  6 each Topical Q0600   dapsone  100 mg Oral Daily   docusate sodium  100 mg Oral Daily   dolutegravir  50 mg Oral Daily   doravirine  100 mg Oral Daily   empagliflozin  10 mg Oral Daily   escitalopram  20 mg Oral  Daily   finasteride  5 mg Oral Daily   gabapentin  100 mg Oral TID   icosapent Ethyl  2 g Oral BID   insulin aspart  0-5 Units Subcutaneous QHS   lamiVUDine  300 mg Oral Daily   multivitamin with  minerals  1 tablet Oral Q24H   mupirocin ointment  1 application Nasal BID   oxybutynin  2.5 mg Oral BID   oxyCODONE  10 mg Oral QID   pantoprazole  40 mg Oral Daily   senna  2 tablet Oral QHS   zinc sulfate  220 mg Oral Daily    Antimicrobials: Anti-infectives (From admission, onward)    Start     Dose/Rate Route Frequency Ordered Stop   12/02/20 1230  ceFAZolin (ANCEF) IVPB 2g/100 mL premix        2 g 200 mL/hr over 30 Minutes Intravenous Every 8 hours 12/02/20 1134 12/02/20 2245   12/02/20 0600  ceFAZolin (ANCEF) IVPB 3g/100 mL premix        3 g 200 mL/hr over 30 Minutes Intravenous On call to O.R. 12/01/20 1909 12/02/20 0950   12/01/20 0600  ceFAZolin (ANCEF) IVPB 2g/100 mL premix  Status:  Discontinued        2 g 200 mL/hr over 30 Minutes Intravenous On call to O.R. 11/30/20 1817 11/30/20 1819   12/01/20 0600  ceFAZolin (ANCEF) IVPB 3g/100 mL premix        3 g 200 mL/hr over 30 Minutes Intravenous On call to O.R. 11/30/20 1820 12/02/20 0559   11/18/20 0100  ceFAZolin (ANCEF) IVPB 2g/100 mL premix        2 g 200 mL/hr over 30 Minutes Intravenous Every 8 hours 11/17/20 2045 11/18/20 1038   11/17/20 0600  ceFAZolin (ANCEF) IVPB 3g/100 mL premix        3 g 200 mL/hr over 30 Minutes Intravenous On call to O.R. 11/16/20 1941 11/17/20 1901   11/11/20 1200  vancomycin (VANCOREADY) IVPB 500 mg/100 mL  Status:  Discontinued        500 mg 100 mL/hr over 60 Minutes Intravenous Every T-Th-Sa (Hemodialysis) 11/09/20 1414 11/11/20 1328   11/10/20 0000  Dolutegravir-lamiVUDine (DOVATO) 50-300 MG TABS        1 tablet Oral Daily 11/10/20 1048     11/10/20 0000  doravirine (PIFELTRO) 100 MG TABS tablet        100 mg Oral Daily 11/10/20 1048     11/08/20 1000  dolutegravir (TIVICAY) tablet 50 mg         50 mg Oral Daily 11/07/20 1451     11/08/20 1000  lamiVUDine (EPIVIR) tablet 300 mg        300 mg Oral Daily 11/07/20 1451     11/08/20 1000  doravirine (PIFELTRO) tablet 100 mg        100 mg Oral Daily 11/07/20 1451     11/02/20 1130  ceFAZolin (ANCEF) IVPB 2g/100 mL premix        2 g 200 mL/hr over 30 Minutes Intravenous To Radiology 11/02/20 1042 11/02/20 1704   10/27/20 1245  ceFAZolin (ANCEF) IVPB 3g/100 mL premix        3 g 200 mL/hr over 30 Minutes Intravenous On call to O.R. 10/27/20 0755 10/27/20 1608   10/20/20 1415  linezolid (ZYVOX) tablet 600 mg  Status:  Discontinued        600 mg Oral Every 12 hours 10/20/20 1326 10/28/20 1115   10/19/20 1445  metroNIDAZOLE (FLAGYL) tablet 500 mg  Status:  Discontinued        500 mg Oral Every 12 hours 10/19/20 1347 10/28/20 1115   10/18/20 1600  ceFEPIme (MAXIPIME) 2 g in sodium chloride 0.9 % 100 mL IVPB  Status:  Discontinued  2 g 200 mL/hr over 30 Minutes Intravenous Every 12 hours 10/18/20 0414 10/28/20 1112   10/18/20 1000  dapsone tablet 100 mg        100 mg Oral Daily 10/18/20 0425     10/18/20 1000  bictegravir-emtricitabine-tenofovir AF (BIKTARVY) 50-200-25 MG per tablet 1 tablet  Status:  Discontinued        1 tablet Oral Daily 10/18/20 0610 11/07/20 1451   10/18/20 0415  metroNIDAZOLE (FLAGYL) IVPB 500 mg  Status:  Discontinued        500 mg 100 mL/hr over 60 Minutes Intravenous Every 8 hours 10/18/20 0404 10/19/20 1347   10/18/20 0415  ceFEPIme (MAXIPIME) 2 g in sodium chloride 0.9 % 100 mL IVPB        2 g 200 mL/hr over 30 Minutes Intravenous  Once 10/18/20 0414 10/18/20 0500   10/18/20 0345  vancomycin (VANCOREADY) IVPB 2000 mg/400 mL        2,000 mg 200 mL/hr over 120 Minutes Intravenous  Once 10/18/20 0335 10/18/20 0608       PRN meds: acetaminophen, alum & mag hydroxide-simeth, bisacodyl, diphenoxylate-atropine, guaiFENesin-dextromethorphan, hydrALAZINE, HYDROmorphone (DILAUDID) injection, labetalol,  magnesium sulfate bolus IVPB, metoprolol tartrate, ondansetron, oxyCODONE, oxyCODONE, phenol, polyethylene glycol, potassium chloride   Objective: Vitals:   12/05/20 0731 12/05/20 1439  BP: (!) 117/56 (!) 142/94  Pulse: 85 82  Resp: 18 17  Temp: 99.1 F (37.3 C) 98.9 F (37.2 C)  SpO2: 97% 95%    Intake/Output Summary (Last 24 hours) at 12/05/2020 1452 Last data filed at 12/05/2020 0844 Gross per 24 hour  Intake 1274.11 ml  Output 850 ml  Net 424.11 ml   Filed Weights   11/20/20 0618 11/23/20 0618 11/23/20 2144  Weight: 117.3 kg 121.4 kg 121.4 kg   Weight change:  Body mass index is 38.4 kg/m.   Physical Exam: General exam: Pleasant, middle-aged African-American male.  Not in distress skin: No rashes, lesions or ulcers. HEENT: Atraumatic, normocephalic, no obvious bleeding Lungs: Clear to auscultation bilaterally CVS: Regular rate and rhythm, no murmur GI/Abd soft, nontender, nondistended, bowel sound present CNS: Alert, awake, oriented x3 Psychiatry: Mood appropriate Extremities: Prior right BKA status. New left BKA with bandage around lower leg.  Not bleeding at this time.  Data Review: I have personally reviewed the laboratory data and studies available.  Recent Labs  Lab 12/01/20 0232 12/02/20 0040 12/03/20 0148 12/04/20 0305 12/04/20 1457 12/04/20 2136 12/05/20 0314  WBC 9.0 10.0 16.1* 14.1*  --   --  12.8*  NEUTROABS 6.0 7.4 14.0* 10.7*  --   --  9.7*  HGB 8.8* 8.1* 7.1* 6.2* 6.9* 8.0* 8.1*  HCT 26.8* 24.7* 21.1* 19.5* 21.3* 24.8* 24.9*  MCV 88.7 90.1 89.8 91.1  --   --  91.2  PLT 459* 444* 404* 390  --   --  356   Recent Labs  Lab 12/01/20 0232 12/02/20 0040 12/03/20 0148 12/04/20 0305 12/04/20 2136 12/05/20 0314  NA 133* 134* 130* 133* 132*  --   K 4.6 5.1 4.9 4.8 4.5  --   CL 103 104 100 102 103  --   CO2 22 23 20* 21* 21*  --   GLUCOSE 141* 122* 160* 131* 122*  --   BUN 27* 24* 25* 29* 27*  --   CREATININE 2.03* 2.04* 2.24* 2.49* 2.15*   --   CALCIUM 8.7* 8.7* 8.3* 8.7* 8.4*  --   MG 2.0 1.9 1.8 2.0  --  2.0  PHOS 3.7 4.1 3.0 3.7 4.2  --     F/u labs ordered Unresulted Labs (From admission, onward)     Start     Ordered   12/03/20 0500  CBC  Daily,   R     Question:  Specimen collection method  Answer:  Lab=Lab collect   12/02/20 1134   11/21/20 0500  CBC with Differential/Platelet  Daily,   R     Question:  Specimen collection method  Answer:  Lab=Lab collect   11/20/20 1010   11/21/20 0500  Magnesium  Daily,   R     Question:  Specimen collection method  Answer:  Lab=Lab collect   11/20/20 1010   11/02/20 0500  Renal function panel  Daily,   R     Question:  Specimen collection method  Answer:  Lab=Lab collect   11/01/20 1153            Signed, Terrilee Croak, MD [facility] 12/05/2020

## 2020-12-05 NOTE — Progress Notes (Signed)
PT Cancellation Note  Patient Details Name: Keith [name] MRN: [name] DOB: 1966/03/16   Cancelled Treatment:    Reason Eval/Treat Not Completed: Patient declined, no reason specified (Pt intermittently closing eyes and refusing to respond to therapist's questions.  Pt educated on the importance of mobility as he will need to be able to mobilize in the event he returns home.)He reports he is too painful but closing his eyes and appears to be resting comfortably.  Limb guard remains absent despite education that it needs to be donned.    Rella Egelston [name] 12/05/2020, 2:12 PM  [name] , PTA Acute Rehabilitation Services Pager [phone] Office [phone]

## 2020-12-06 DIAGNOSIS — N179 Acute kidney failure, unspecified: Secondary | ICD-10-CM | POA: Diagnosis not present

## 2020-12-06 LAB — GLUCOSE, CAPILLARY
Glucose-Capillary: 115 mg/dL — ABNORMAL HIGH (ref 70–99)
Glucose-Capillary: 167 mg/dL — ABNORMAL HIGH (ref 70–99)
Glucose-Capillary: 167 mg/dL — ABNORMAL HIGH (ref 70–99)

## 2020-12-06 LAB — CBC WITH DIFFERENTIAL/PLATELET
Abs Immature Granulocytes: 0.12 10*3/uL — ABNORMAL HIGH (ref 0.00–0.07)
Basophils Absolute: 0 10*3/uL (ref 0.0–0.1)
Basophils Relative: 0 %
Eosinophils Absolute: 0.4 10*3/uL (ref 0.0–0.5)
Eosinophils Relative: 3 %
HCT: 25.9 % — ABNORMAL LOW (ref 39.0–52.0)
Hemoglobin: 8.6 g/dL — ABNORMAL LOW (ref 13.0–17.0)
Immature Granulocytes: 1 %
Lymphocytes Relative: 10 %
Lymphs Abs: 1.2 10*3/uL (ref 0.7–4.0)
MCH: 30.4 pg (ref 26.0–34.0)
MCHC: 33.2 g/dL (ref 30.0–36.0)
MCV: 91.5 fL (ref 80.0–100.0)
Monocytes Absolute: 0.8 10*3/uL (ref 0.1–1.0)
Monocytes Relative: 6 %
Neutro Abs: 10.1 10*3/uL — ABNORMAL HIGH (ref 1.7–7.7)
Neutrophils Relative %: 80 %
Platelets: 378 10*3/uL (ref 150–400)
RBC: 2.83 MIL/uL — ABNORMAL LOW (ref 4.22–5.81)
RDW: 14.3 % (ref 11.5–15.5)
WBC: 12.6 10*3/uL — ABNORMAL HIGH (ref 4.0–10.5)
nRBC: 0.2 % (ref 0.0–0.2)

## 2020-12-06 LAB — RENAL FUNCTION PANEL
Albumin: 2.2 g/dL — ABNORMAL LOW (ref 3.5–5.0)
Anion gap: 8 (ref 5–15)
BUN: 25 mg/dL — ABNORMAL HIGH (ref 6–20)
CO2: 22 mmol/L (ref 22–32)
Calcium: 8.7 mg/dL — ABNORMAL LOW (ref 8.9–10.3)
Chloride: 104 mmol/L (ref 98–111)
Creatinine, Ser: 2.03 mg/dL — ABNORMAL HIGH (ref 0.61–1.24)
GFR, Estimated: 38 mL/min — ABNORMAL LOW (ref >60)
Glucose, Bld: 117 mg/dL — ABNORMAL HIGH (ref 70–99)
Phosphorus: 4 mg/dL (ref 2.5–4.6)
Potassium: 4.5 mmol/L (ref 3.5–5.1)
Sodium: 134 mmol/L — ABNORMAL LOW (ref 135–145)

## 2020-12-06 LAB — MAGNESIUM: Magnesium: 1.9 mg/dL (ref 1.7–2.4)

## 2020-12-06 MED ORDER — HYDROMORPHONE HCL 1 MG/ML IJ SOLN
0.5000 mg | Freq: Four times a day (QID) | INTRAMUSCULAR | Status: DC | PRN
Start: 2020-12-06 — End: 2020-12-23
  Administered 2020-12-06 – 2020-12-23 (×48): 0.5 mg via INTRAVENOUS
  Filled 2020-12-06 (×50): qty 0.5

## 2020-12-06 NOTE — Progress Notes (Addendum)
Physical Therapy Treatment Patient Details Name: Keith [name] MRN: [name] DOB: [birth year] Today's Date: 12/06/2020    History of Present Illness 55 y.o. male  who is sent to the ED by his orthopedic surgery provider due to concern for left heel ulcer that had failed outpatient therapy, now s/p L BKA 10/27/20. s/p REVISIONs L AMPUTATION BELOW KNEE on 8/12, and 8/27;  with medical history significant for HIV, CKD 3B, type 2 diabetes, hypertension, hyperlipidemia, coronary artery disease, status post right below the knee amputation    PT Comments    Upon arrival, pt resting with limb protector off L residual limb. Pt reported a MD told him not to wear it, contacted MD to confirm and awaiting response. Pt is insistent on performing lateral transfers without physical assistance or use of the sliding board, needing min guard assist for safety. He displays improved propulsion sequencing with w/c mobility today, but needs further education to maximize his efficiency to reduce risk of shoulder pain/fatigue and improve his ability to navigate around obstacles. Educated pt on positioning of L residual limb and exercises. Pt reporting too much pain to perform on L at this time but performed them on the R instead. Will continue to follow acutely. Current recommendations remain appropriate.   Follow Up Recommendations  SNF     Equipment Recommendations  Wheelchair (measurements PT);Wheelchair cushion (measurements PT)    Recommendations for Other Services       Precautions / Restrictions Precautions Precautions: Fall Precaution Comments: B BKA's: R healed,  L BKA rev 8/27, RLE prosthesis, wound vac L leg Required Braces or Orthoses: Other Brace Other Brace: limb protector on left (off upon arrival with pt reporting MD told him not to wear it, contacted attending MD to re-confirm) Restrictions Weight Bearing Restrictions: Yes RLE Weight Bearing: Weight bearing as tolerated LLE Weight Bearing:  Non weight bearing    Mobility  Bed Mobility Overal bed mobility: Needs Assistance Bed Mobility: Supine to Sit     Supine to sit: Modified independent (Device/Increase time);HOB elevated     General bed mobility comments: Pt using bed rails with HOB elevated to transition supine > sit R EOB with extra time.    Transfers Overall transfer level: Needs assistance Equipment used: None Transfers: Lateral/Scoot Transfers          Lateral/Scoot Transfers: Min guard General transfer comment: Pt denying utilizing sliding board today. Pt transfered laterally towards R 1x EOB > w/c and 1x w/c > recliner with arm dropped. Min guard assist provided for safety as pt was insistent on doing it without physical assistance. Surfaces held by PT in addition to brakes being applied to ensure they did not move for safety.  Ambulation/Gait             General Gait Details: Deferred today.   Stairs             Information systems manager mobility: Yes Wheelchair propulsion: Both upper extremities Wheelchair parts: Needs assistance Distance: 80 Wheelchair Assistance Details (indicate cue type and reason): Improved simultaneous propulsion with bil UEs, but needs cues to release at end for improved efficiency rather than dragging hands on wheel rims and causing friction slowing w/c down. Pt tends to drift to the R and needed hand-over-hand directing for turning w/c through pushing anteriorly on wheel with 1 UE and posteriorly on wheel with other UE simultaneously, improved negotiation around obstacles noted following this direction. x1 rest break. Pt asking to be pushed back to  room afterwards due to fatigue.  Modified Rankin (Stroke Patients Only)       Balance Overall balance assessment: Needs assistance Sitting-balance support: No upper extremity supported;Feet supported Sitting balance-Leahy Scale: Good Sitting balance - Comments: Supervision sitting EOB  statically.                                    Cognition Arousal/Alertness: Awake/alert Behavior During Therapy: Flat affect Overall Cognitive Status: Impaired/Different from baseline Area of Impairment: Awareness;Safety/judgement;Following commands;Problem solving;Attention                   Current Attention Level: Sustained   Following Commands: Follows one step commands with increased time;Follows one step commands consistently;Follows multi-step commands inconsistently Safety/Judgement: Decreased awareness of safety;Decreased awareness of deficits Awareness: Emergent Problem Solving: Slow processing;Difficulty sequencing;Requires verbal cues General Comments: Pt with poor problem solving and insight into safety, especially in regard to transfers. He was more receptive to being cued to wait and allow PT to explain set-up for safety purposes this session. He is insistent on performing transfers without assistance. Improved sequencing with w/c mobility.      Exercises General Exercises - Lower Extremity Long Arc Quad: AROM;10 reps;Seated;Right Heel Slides: AROM;10 reps;Seated;Right Hip ABduction/ADduction: AROM;Right;10 reps;Seated Straight Leg Raises: AROM;Right;10 reps;Seated Other Exercises Other Exercises: educated pt on positioning L residual limb and performing quad sets to improve knee extension    General Comments General comments (skin integrity, edema, etc.): Educated pt to donn limb protector but he reported the MD told him not to. Contacted attending MD Marlowe Aschoff to confirm, Jacqulynn Cadet, [place] reported he would have [name] address it; educated pt on positioning residual limb knee in extension with pillow under lower leg, not under knee, and to perform 4 way hip exercises and knee extension and flexion exercises. Pt reporting too much pain to perform exercises at this time but reported he would later.      Pertinent Vitals/Pain Pain Assessment: Faces Faces  Pain Scale: Hurts even more Pain Location: L residual limb Pain Descriptors / Indicators: Grimacing;Operative site guarding Pain Intervention(s): Limited activity within patient's tolerance;Monitored during session;Repositioned;RN gave pain meds during session    Home Living                      Prior Function            PT Goals (current goals can now be found in the care plan section) Acute Rehab PT Goals Patient Stated Goal: get out of the room PT Goal Formulation: With patient Time For Goal Achievement: 12/17/20 Potential to Achieve Goals: Fair Progress towards PT goals: Progressing toward goals    Frequency    Min 3X/week      PT Plan Current plan remains appropriate    Co-evaluation              AM-PAC PT "6 Clicks" Mobility   Outcome Measure  Help needed turning from your back to your side while in a flat bed without using bedrails?: None Help needed moving from lying on your back to sitting on the side of a flat bed without using bedrails?: None Help needed moving to and from a bed to a chair (including a wheelchair)?: A Little Help needed standing up from a chair using your arms (e.g., wheelchair or bedside chair)?: A Lot Help needed to walk in hospital room?: Total Help needed climbing 3-5  steps with a railing? : Total 6 Click Score: 15    End of Session   Activity Tolerance: Patient tolerated treatment well Patient left: in chair;with call bell/phone within reach;with chair alarm set Nurse Communication: Mobility status PT Visit Diagnosis: Unsteadiness on feet (R26.81);Other abnormalities of gait and mobility (R26.89);Pain;Muscle weakness (generalized) (M62.81);Difficulty in walking, not elsewhere classified (R26.2) Pain - Right/Left: Left Pain - part of body: Leg     Time: 1344-1419 PT Time Calculation (min) (ACUTE ONLY): 35 min  Charges:  $Therapeutic Exercise: 8-22 mins $Therapeutic Activity: 8-22 mins                     Moishe Spice, PT, DPT Acute Rehabilitation Services  Pager: [phone] Office: [place] 12/06/2020, 2:47 PM

## 2020-12-06 NOTE — Progress Notes (Signed)
PROGRESS NOTE  Keith [name]  DOB: [birth year]  [name], NP [id number]  DOA: 10/17/2020  LOS: 1 days  Hospital Day: 50   Chief Complaint  Patient presents with   Foot Ulcer    Brief narrative: Keith [name] is a 55 y.o. male with PMH significant for DM2, HTN, HLD, CAD, CKD 3B, HIV, right BKA status.  Patient was sent to the ED on 7/12 by his orthopedic surgeon due to concern of left heel ulcer that failed outpatient therapy.   Subsequently, MRI confirmed osteomyelitis.  He underwent left transtibial amputation by [name].  During the hospitalization, he had worsening renal function.  Nephrology was consulted.  He had temporary dialysis catheter placed and dialysis was started.  Later, he developed hematuria and required fulguration and CBI along with blood transfusions.  Renal function improved; dialysis was stopped and nephrology signed off.  Subsequently, his wound dehisced with bleeding requiring blood transfusion.   See below for details.  Subjective: Patient was seen and examined in the afternoon.  Lying on bed.  Not in distress.  Pain controlled.  Assessment/Plan: Acute kidney injury with metabolic acidosis  -Patient presented with worsening renal functions and started on dialysis during this admission but was then subsequently stopped and nephrology signed off.  AKI was felt to be due to ATN.  Tunneled dialysis catheter has already been removed. -Renal ultrasound was negative for hydronephrosis. -Creatinine better today at 2.03.  Currently remains off IV fluid. Recent Labs    11/27/20 0345 11/28/20 1630 11/29/20 0721 11/30/20 0457 12/01/20 0232 12/02/20 0040 12/03/20 0148 12/04/20 0305 12/04/20 2136 12/06/20 0122  BUN 25* 27* 29* 29* 27* 24* 25* 29* 27* 25*  CREATININE 2.05* 2.07* 2.38* 2.30* 2.03* 2.04* 2.24* 2.49* 2.15* 2.03*  CO2 '23 24 25 22 22 23 '$ 20* 21* 21* 22    Left heel osteomyelitis Recurrent wound dehiscence and bleeding -Patient  has a previous right BKA status. -7/22, patient had transtibial amputation which failed leading to wound dehiscence and bleeding.  -8/12, patient had revision amputation below-knee.  Patient continued to have poor wound healing and bleeding.   -8/27, patient had revision left BKA. -Currently pain controlled with Dilaudid 2 mg every 6 hours and oxycodone as needed.  Continue to taper down IV Dilaudid.    Hematuria Acute urinary retention -11/08/2020 -Foley catheter placement with drainage of clots.  Urology was consulted  -8/4 - underwent cystoscopy with fulguration and started on CBI and Proscar.  Subsequently CBI was discontinued. -8/15-Foley catheter discontinued. -8/22-patient had another episode of hematuria and clots. Urology recommended Proscar, Uroxatrol and IV hydration. Hematuria has now improved.   Anemia due to acute blood loss from bladder and left stump -Hemoglobin at 8.6 today.  13 units of PRBC transfused so far this admission.  -Continue monitor hemoglobin. Recent Labs    04/26/20 1228 04/27/20 0802 12/04/20 0305 12/04/20 1457 12/04/20 2136 12/05/20 0314 12/06/20 0122  HGB 10.7*   < > 6.2* 6.9* 8.0* 8.1* 8.6*  MCV 84.5   < > 91.1  --   --  91.2 91.5  VITAMINB12 451  --   --   --   --   --   --   FOLATE 8.7  --   --   --   --   --   --   FERRITIN 347*  --   --   --   --   --   --   TIBC 276  --   --   --   --   --   --  IRON 71  --   --   --   --   --   --   RETICCTPCT 1.8  --   --   --   --   --   --    < > = values in this interval not displayed.    Uncontrolled type 2 diabetes mellitus Hyperglycemia -A1c was 13.9 on May 2022.  Repeat A1c on 8/24 was at 5.9.  Wonder if it is truly low because of diet control in the hospital or spuriously low due to frequent blood loss and multiple transfusions.   -Currently on Jardiance 10 mg daily and sliding scale insulin.  Blood sugar is consistently under 150.  Okay to stop fingerstick blood sugar check.  CAD status post  stenting in 03/2020 -Continue aspirin.  Because of recurrent bleeding, Brilinta was discontinued by cardiology on 11/19/2020.   -Once bleeding stabilizes, will plan to resume Brilinta.  Continue Vascepa and Lipitor  HIV -Continue current regimen of antiretrovirals and dapsone as per ID.   -Outpatient follow-up with ID   Mobility: PT eval obtained.  SNF recommended Code Status:   Code Status: Full Code  Nutritional status: Body mass index is 38.4 kg/m. Nutrition Problem: Increased nutrient needs Etiology: post-op healing Signs/Symptoms: estimated needs Diet:  Diet Order             Diet Carb Modified Fluid consistency: Thin; Room service appropriate? Yes  Diet effective now                  DVT prophylaxis: Not on heparin products because of frequent bleeding.  Bilateral BKA status, unable to use SCDs SCD's Start: 12/02/20 1135 Antimicrobials: None Fluid: Not on IV fluid Consultants: Orthopedics Family Communication: None at bedside  Status is: Inpatient  Remains inpatient appropriate because: Pending SNF  Dispo: The patient is from: Home              Anticipated d/c is to: SNF               Patient currently is medically stable to d/c.   Difficult to place patient No     Infusions:   magnesium sulfate bolus IVPB      Scheduled Meds:  (feeding supplement) PROSource Plus  30 mL Oral BID BM   alfuzosin  10 mg Oral Q breakfast   allopurinol  100 mg Oral Daily   vitamin C  1,000 mg Oral Daily   aspirin EC  81 mg Oral Daily   atorvastatin  80 mg Oral Daily   dapsone  100 mg Oral Daily   docusate sodium  100 mg Oral Daily   dolutegravir  50 mg Oral Daily   doravirine  100 mg Oral Daily   empagliflozin  10 mg Oral Daily   escitalopram  20 mg Oral Daily   finasteride  5 mg Oral Daily   gabapentin  100 mg Oral TID   icosapent Ethyl  2 g Oral BID   lamiVUDine  300 mg Oral Daily   multivitamin with minerals  1 tablet Oral Q24H   oxybutynin  2.5 mg Oral BID    oxyCODONE  10 mg Oral QID   pantoprazole  40 mg Oral Daily   senna  2 tablet Oral QHS   zinc sulfate  220 mg Oral Daily    Antimicrobials: Anti-infectives (From admission, onward)    Start     Dose/Rate Route Frequency Ordered Stop   12/02/20 1230  ceFAZolin (ANCEF)  IVPB 2g/100 mL premix        2 g 200 mL/hr over 30 Minutes Intravenous Every 8 hours 12/02/20 1134 12/02/20 2245   12/02/20 0600  ceFAZolin (ANCEF) IVPB 3g/100 mL premix        3 g 200 mL/hr over 30 Minutes Intravenous On call to O.R. 12/01/20 1909 12/02/20 0950   12/01/20 0600  ceFAZolin (ANCEF) IVPB 2g/100 mL premix  Status:  Discontinued        2 g 200 mL/hr over 30 Minutes Intravenous On call to O.R. 11/30/20 1817 11/30/20 1819   12/01/20 0600  ceFAZolin (ANCEF) IVPB 3g/100 mL premix        3 g 200 mL/hr over 30 Minutes Intravenous On call to O.R. 11/30/20 1820 12/02/20 0559   11/18/20 0100  ceFAZolin (ANCEF) IVPB 2g/100 mL premix        2 g 200 mL/hr over 30 Minutes Intravenous Every 8 hours 11/17/20 2045 11/18/20 1038   11/17/20 0600  ceFAZolin (ANCEF) IVPB 3g/100 mL premix        3 g 200 mL/hr over 30 Minutes Intravenous On call to O.R. 11/16/20 1941 11/17/20 1901   11/11/20 1200  vancomycin (VANCOREADY) IVPB 500 mg/100 mL  Status:  Discontinued        500 mg 100 mL/hr over 60 Minutes Intravenous Every T-Th-Sa (Hemodialysis) 11/09/20 1414 11/11/20 1328   11/10/20 0000  Dolutegravir-lamiVUDine (DOVATO) 50-300 MG TABS        1 tablet Oral Daily 11/10/20 1048     11/10/20 0000  doravirine (PIFELTRO) 100 MG TABS tablet        100 mg Oral Daily 11/10/20 1048     11/08/20 1000  dolutegravir (TIVICAY) tablet 50 mg        50 mg Oral Daily 11/07/20 1451     11/08/20 1000  lamiVUDine (EPIVIR) tablet 300 mg        300 mg Oral Daily 11/07/20 1451     11/08/20 1000  doravirine (PIFELTRO) tablet 100 mg        100 mg Oral Daily 11/07/20 1451     11/02/20 1130  ceFAZolin (ANCEF) IVPB 2g/100 mL premix        2 g 200 mL/hr  over 30 Minutes Intravenous To Radiology 11/02/20 1042 11/02/20 1704   10/27/20 1245  ceFAZolin (ANCEF) IVPB 3g/100 mL premix        3 g 200 mL/hr over 30 Minutes Intravenous On call to O.R. 10/27/20 0755 10/27/20 1608   10/20/20 1415  linezolid (ZYVOX) tablet 600 mg  Status:  Discontinued        600 mg Oral Every 12 hours 10/20/20 1326 10/28/20 1115   10/19/20 1445  metroNIDAZOLE (FLAGYL) tablet 500 mg  Status:  Discontinued        500 mg Oral Every 12 hours 10/19/20 1347 10/28/20 1115   10/18/20 1600  ceFEPIme (MAXIPIME) 2 g in sodium chloride 0.9 % 100 mL IVPB  Status:  Discontinued        2 g 200 mL/hr over 30 Minutes Intravenous Every 12 hours 10/18/20 0414 10/28/20 1112   10/18/20 1000  dapsone tablet 100 mg        100 mg Oral Daily 10/18/20 0425     10/18/20 1000  bictegravir-emtricitabine-tenofovir AF (BIKTARVY) 50-200-25 MG per tablet 1 tablet  Status:  Discontinued        1 tablet Oral Daily 10/18/20 0610 11/07/20 1451   10/18/20 0415  metroNIDAZOLE (FLAGYL) IVPB 500  mg  Status:  Discontinued        500 mg 100 mL/hr over 60 Minutes Intravenous Every 8 hours 10/18/20 0404 10/19/20 1347   10/18/20 0415  ceFEPIme (MAXIPIME) 2 g in sodium chloride 0.9 % 100 mL IVPB        2 g 200 mL/hr over 30 Minutes Intravenous  Once 10/18/20 0414 10/18/20 0500   10/18/20 0345  vancomycin (VANCOREADY) IVPB 2000 mg/400 mL        2,000 mg 200 mL/hr over 120 Minutes Intravenous  Once 10/18/20 0335 10/18/20 0608       PRN meds: acetaminophen, alum & mag hydroxide-simeth, bisacodyl, diphenoxylate-atropine, guaiFENesin-dextromethorphan, hydrALAZINE, HYDROmorphone (DILAUDID) injection, labetalol, magnesium sulfate bolus IVPB, metoprolol tartrate, ondansetron, oxyCODONE, oxyCODONE, phenol, polyethylene glycol, potassium chloride   Objective: Vitals:   12/06/20 0806 12/06/20 1311  BP: 131/75 133/78  Pulse: 88 78  Resp: 16 16  Temp: 99.1 F (37.3 C) 97.9 F (36.6 C)  SpO2: 100% 98%     Intake/Output Summary (Last 24 hours) at 12/06/2020 1522 Last data filed at 12/06/2020 1300 Gross per 24 hour  Intake 360 ml  Output 2124 ml  Net -1764 ml    Filed Weights   11/20/20 0618 11/23/20 0618 11/23/20 2144  Weight: 117.3 kg 121.4 kg 121.4 kg   Weight change:  Body mass index is 38.4 kg/m.   Physical Exam: General exam: Pleasant, middle-aged African-American male.  Not in distress skin: No rashes, lesions or ulcers. HEENT: Atraumatic, normocephalic, no obvious bleeding Lungs: Clear to auscultation bilaterally CVS: Regular rate and rhythm, no murmur GI/Abd soft, nontender, nondistended, bowel sound present CNS: Alert, awake, oriented x3 Psychiatry: Mood appropriate Extremities: Prior right BKA status. New left BKA with bandage around lower leg.  Not bleeding at this time.  Data Review: I have personally reviewed the laboratory data and studies available.  Recent Labs  Lab 12/02/20 0040 12/03/20 0148 12/04/20 0305 12/04/20 1457 12/04/20 2136 12/05/20 0314 12/06/20 0122  WBC 10.0 16.1* 14.1*  --   --  12.8* 12.6*  NEUTROABS 7.4 14.0* 10.7*  --   --  9.7* 10.1*  HGB 8.1* 7.1* 6.2* 6.9* 8.0* 8.1* 8.6*  HCT 24.7* 21.1* 19.5* 21.3* 24.8* 24.9* 25.9*  MCV 90.1 89.8 91.1  --   --  91.2 91.5  PLT 444* 404* 390  --   --  356 378    Recent Labs  Lab 12/02/20 0040 12/03/20 0148 12/04/20 0305 12/04/20 2136 12/05/20 0314 12/06/20 0122  NA 134* 130* 133* 132*  --  134*  K 5.1 4.9 4.8 4.5  --  4.5  CL 104 100 102 103  --  104  CO2 23 20* 21* 21*  --  22  GLUCOSE 122* 160* 131* 122*  --  117*  BUN 24* 25* 29* 27*  --  25*  CREATININE 2.04* 2.24* 2.49* 2.15*  --  2.03*  CALCIUM 8.7* 8.3* 8.7* 8.4*  --  8.7*  MG 1.9 1.8 2.0  --  2.0 1.9  PHOS 4.1 3.0 3.7 4.2  --  4.0     F/u labs ordered Unresulted Labs (From admission, onward)     Start     Ordered   12/07/20 0500  HCV RNA quant rflx ultra or genotyp  Tomorrow morning,   R       Question:  Specimen  collection method  Answer:  Lab=Lab collect   12/06/20 1027   12/07/20 0500  T-helper cells (CD4) count (not at [facility])  Tomorrow morning,   R       Question:  Specimen collection method  Answer:  Lab=Lab collect   12/06/20 1027   12/03/20 0500  CBC  Daily,   R     Question:  Specimen collection method  Answer:  Lab=Lab collect   12/02/20 1134   11/21/20 0500  CBC with Differential/Platelet  Daily,   R     Question:  Specimen collection method  Answer:  Lab=Lab collect   11/20/20 1010   11/21/20 0500  Magnesium  Daily,   R     Question:  Specimen collection method  Answer:  Lab=Lab collect   11/20/20 1010   11/02/20 0500  Renal function panel  Daily,   R     Question:  Specimen collection method  Answer:  Lab=Lab collect   11/01/20 1153            Signed, Terrilee Croak, MD [facility] 12/06/2020

## 2020-12-06 NOTE — Progress Notes (Signed)
Postop patient is postop day 5 status post left below-knee amputation revision.  He is lying in bed comfortable.  Wound VAC is functioning well with 0 cc in the canister.    Patient is planned to go to skilled nursing per physical therapy.  Looks like waiting on insurance authorization but bed available.  From a orthopedic perspective can discharge with 1 week follow-up in our office

## 2020-12-07 DIAGNOSIS — N179 Acute kidney failure, unspecified: Secondary | ICD-10-CM | POA: Diagnosis not present

## 2020-12-07 LAB — CBC WITH DIFFERENTIAL/PLATELET
Abs Immature Granulocytes: 0.15 10*3/uL — ABNORMAL HIGH (ref 0.00–0.07)
Basophils Absolute: 0.1 10*3/uL (ref 0.0–0.1)
Basophils Relative: 0 %
Eosinophils Absolute: 0.4 10*3/uL (ref 0.0–0.5)
Eosinophils Relative: 4 %
HCT: 24.6 % — ABNORMAL LOW (ref 39.0–52.0)
Hemoglobin: 7.9 g/dL — ABNORMAL LOW (ref 13.0–17.0)
Immature Granulocytes: 1 %
Lymphocytes Relative: 12 %
Lymphs Abs: 1.4 10*3/uL (ref 0.7–4.0)
MCH: 29.4 pg (ref 26.0–34.0)
MCHC: 32.1 g/dL (ref 30.0–36.0)
MCV: 91.4 fL (ref 80.0–100.0)
Monocytes Absolute: 0.7 10*3/uL (ref 0.1–1.0)
Monocytes Relative: 6 %
Neutro Abs: 8.9 10*3/uL — ABNORMAL HIGH (ref 1.7–7.7)
Neutrophils Relative %: 77 %
Platelets: 410 10*3/uL — ABNORMAL HIGH (ref 150–400)
RBC: 2.69 MIL/uL — ABNORMAL LOW (ref 4.22–5.81)
RDW: 14.3 % (ref 11.5–15.5)
WBC: 11.7 10*3/uL — ABNORMAL HIGH (ref 4.0–10.5)
nRBC: 0 % (ref 0.0–0.2)

## 2020-12-07 LAB — RENAL FUNCTION PANEL
Albumin: 2.2 g/dL — ABNORMAL LOW (ref 3.5–5.0)
Anion gap: 9 (ref 5–15)
BUN: 25 mg/dL — ABNORMAL HIGH (ref 6–20)
CO2: 23 mmol/L (ref 22–32)
Calcium: 8.6 mg/dL — ABNORMAL LOW (ref 8.9–10.3)
Chloride: 101 mmol/L (ref 98–111)
Creatinine, Ser: 2.15 mg/dL — ABNORMAL HIGH (ref 0.61–1.24)
GFR, Estimated: 35 mL/min — ABNORMAL LOW (ref >60)
Glucose, Bld: 135 mg/dL — ABNORMAL HIGH (ref 70–99)
Phosphorus: 4.3 mg/dL (ref 2.5–4.6)
Potassium: 4.6 mmol/L (ref 3.5–5.1)
Sodium: 133 mmol/L — ABNORMAL LOW (ref 135–145)

## 2020-12-07 LAB — GLUCOSE, CAPILLARY
Glucose-Capillary: 147 mg/dL — ABNORMAL HIGH (ref 70–99)
Glucose-Capillary: 144 mg/dL — ABNORMAL HIGH (ref 70–99)
Glucose-Capillary: 140 mg/dL — ABNORMAL HIGH (ref 70–99)
Glucose-Capillary: 172 mg/dL — ABNORMAL HIGH (ref 70–99)
Glucose-Capillary: 127 mg/dL — ABNORMAL HIGH (ref 70–99)

## 2020-12-07 LAB — MAGNESIUM: Magnesium: 2.2 mg/dL (ref 1.7–2.4)

## 2020-12-07 NOTE — Progress Notes (Signed)
PROGRESS NOTE  Keith [name]  DOB: 10-28-1965  [name], NP [id number]  DOA: 10/17/2020  LOS: 18 days  Hospital Day: 42   Chief Complaint  Patient presents with   Foot Ulcer    Brief narrative: Keith [name] is a 55 y.o. male with PMH significant for DM2, HTN, HLD, CAD, CKD 3B, HIV, right BKA status.  Patient was sent to the ED on 7/12 by his orthopedic surgeon due to concern of left heel ulcer that failed outpatient therapy.   Subsequently, MRI confirmed osteomyelitis.  He underwent left transtibial amputation by [name].  During the hospitalization, he had worsening renal function.  Nephrology was consulted.  He had temporary dialysis catheter placed and dialysis was started.  Later, he developed hematuria and required fulguration and CBI along with blood transfusions.  Renal function improved; dialysis was stopped and nephrology signed off.  Subsequently, his wound dehisced with bleeding requiring blood transfusion.   See below for details.  Subjective: Patient was seen and examined in the afternoon.  Lying on bed.  Not in distress.  No new symptoms.  Pending SNF.  Assessment/Plan: Acute kidney injury with metabolic acidosis  -Patient presented with worsening renal functions and started on dialysis during this admission but was then subsequently stopped and nephrology signed off.  AKI was felt to be due to ATN.  Tunneled dialysis catheter has already been removed. -Renal ultrasound was negative for hydronephrosis. -Creatinine staying above 2.  Stable.  Currently remains off IV fluid. Recent Labs    11/28/20 1630 11/29/20 0721 11/30/20 0457 12/01/20 0232 12/02/20 0040 12/03/20 0148 12/04/20 0305 12/04/20 2136 12/06/20 0122 12/07/20 0201  BUN 27* 29* 29* 27* 24* 25* 29* 27* 25* 25*  CREATININE 2.07* 2.38* 2.30* 2.03* 2.04* 2.24* 2.49* 2.15* 2.03* 2.15*  CO2 '24 25 22 22 23 '$ 20* 21* 21* 22 23    Left heel osteomyelitis Recurrent wound dehiscence and  bleeding -Patient has a previous right BKA status. -7/22, patient had transtibial amputation which failed leading to wound dehiscence and bleeding.  -8/12, patient had revision amputation below-knee.  Patient continued to have poor wound healing and bleeding.   -8/27, patient had revision left BKA. -Currently pain controlled with Dilaudid 0.5 mg every 6 hours and oxycodone as needed.  Continue to taper down IV Dilaudid.    Hematuria Acute urinary retention -11/08/2020 -Foley catheter placement with drainage of clots.  Urology was consulted  -8/4 - underwent cystoscopy with fulguration and started on CBI and Proscar.  Subsequently CBI was discontinued. -8/15-Foley catheter discontinued. -8/22-patient had another episode of hematuria and clots. Urology recommended Proscar, Uroxatrol and IV hydration. Hematuria has now improved.   Anemia due to acute blood loss from bladder and left stump -Hemoglobin at 8.6 today.  13 units of PRBC transfused so far this admission.  -Continue monitor hemoglobin. Recent Labs    04/26/20 1228 04/27/20 0802 12/04/20 1457 12/04/20 2136 12/05/20 0314 12/06/20 0122 12/07/20 0201  HGB 10.7*   < > 6.9* 8.0* 8.1* 8.6* 7.9*  MCV 84.5   < >  --   --  91.2 91.5 91.4  VITAMINB12 451  --   --   --   --   --   --   FOLATE 8.7  --   --   --   --   --   --   FERRITIN 347*  --   --   --   --   --   --  TIBC 276  --   --   --   --   --   --   IRON 71  --   --   --   --   --   --   RETICCTPCT 1.8  --   --   --   --   --   --    < > = values in this interval not displayed.    Uncontrolled type 2 diabetes mellitus Hyperglycemia -A1c was 13.9 on May 2022.  Repeat A1c on 8/24 was at 5.9.  Wonder if it is truly low because of diet control in the hospital or spuriously low due to frequent blood loss and multiple transfusions.   -Currently on Jardiance 10 mg daily and sliding scale insulin.  Blood sugar is consistently under 150.  Okay to stop fingerstick blood sugar  check.  CAD status post stenting in 03/2020 -Continue aspirin.  Because of recurrent bleeding, Brilinta was discontinued by cardiology on 11/19/2020.   -Once bleeding stabilizes, will plan to resume Brilinta.  Continue Vascepa and Lipitor  HIV -Continue current regimen of antiretrovirals and dapsone as per ID.   -Outpatient follow-up with ID   Mobility: PT eval obtained.  SNF recommended Code Status:   Code Status: Full Code  Nutritional status: Body mass index is 38.4 kg/m. Nutrition Problem: Increased nutrient needs Etiology: post-op healing Signs/Symptoms: estimated needs Diet:  Diet Order             Diet Carb Modified Fluid consistency: Thin; Room service appropriate? Yes  Diet effective now                  DVT prophylaxis: Not on heparin products because of frequent bleeding.  Bilateral BKA status, unable to use SCDs SCD's Start: 12/02/20 1135 Antimicrobials: None Fluid: Not on IV fluid Consultants: Orthopedics Family Communication: None at bedside  Status is: Inpatient  Remains inpatient appropriate because: Pending SNF  Dispo: The patient is from: Home              Anticipated d/c is to: SNF               Patient currently is medically stable to d/c.   Difficult to place patient No     Infusions:   magnesium sulfate bolus IVPB      Scheduled Meds:  (feeding supplement) PROSource Plus  30 mL Oral BID BM   alfuzosin  10 mg Oral Q breakfast   allopurinol  100 mg Oral Daily   vitamin C  1,000 mg Oral Daily   aspirin EC  81 mg Oral Daily   atorvastatin  80 mg Oral Daily   dapsone  100 mg Oral Daily   docusate sodium  100 mg Oral Daily   dolutegravir  50 mg Oral Daily   doravirine  100 mg Oral Daily   empagliflozin  10 mg Oral Daily   escitalopram  20 mg Oral Daily   finasteride  5 mg Oral Daily   gabapentin  100 mg Oral TID   icosapent Ethyl  2 g Oral BID   lamiVUDine  300 mg Oral Daily   multivitamin with minerals  1 tablet Oral Q24H    oxybutynin  2.5 mg Oral BID   oxyCODONE  10 mg Oral QID   pantoprazole  40 mg Oral Daily   senna  2 tablet Oral QHS   zinc sulfate  220 mg Oral Daily    Antimicrobials: Anti-infectives (From admission,  onward)    Start     Dose/Rate Route Frequency Ordered Stop   12/02/20 1230  ceFAZolin (ANCEF) IVPB 2g/100 mL premix        2 g 200 mL/hr over 30 Minutes Intravenous Every 8 hours 12/02/20 1134 12/02/20 2245   12/02/20 0600  ceFAZolin (ANCEF) IVPB 3g/100 mL premix        3 g 200 mL/hr over 30 Minutes Intravenous On call to O.R. 12/01/20 1909 12/02/20 0950   12/01/20 0600  ceFAZolin (ANCEF) IVPB 2g/100 mL premix  Status:  Discontinued        2 g 200 mL/hr over 30 Minutes Intravenous On call to O.R. 11/30/20 1817 11/30/20 1819   12/01/20 0600  ceFAZolin (ANCEF) IVPB 3g/100 mL premix        3 g 200 mL/hr over 30 Minutes Intravenous On call to O.R. 11/30/20 1820 12/02/20 0559   11/18/20 0100  ceFAZolin (ANCEF) IVPB 2g/100 mL premix        2 g 200 mL/hr over 30 Minutes Intravenous Every 8 hours 11/17/20 2045 11/18/20 1038   11/17/20 0600  ceFAZolin (ANCEF) IVPB 3g/100 mL premix        3 g 200 mL/hr over 30 Minutes Intravenous On call to O.R. 11/16/20 1941 11/17/20 1901   11/11/20 1200  vancomycin (VANCOREADY) IVPB 500 mg/100 mL  Status:  Discontinued        500 mg 100 mL/hr over 60 Minutes Intravenous Every T-Th-Sa (Hemodialysis) 11/09/20 1414 11/11/20 1328   11/10/20 0000  Dolutegravir-lamiVUDine (DOVATO) 50-300 MG TABS        1 tablet Oral Daily 11/10/20 1048     11/10/20 0000  doravirine (PIFELTRO) 100 MG TABS tablet        100 mg Oral Daily 11/10/20 1048     11/08/20 1000  dolutegravir (TIVICAY) tablet 50 mg        50 mg Oral Daily 11/07/20 1451     11/08/20 1000  lamiVUDine (EPIVIR) tablet 300 mg        300 mg Oral Daily 11/07/20 1451     11/08/20 1000  doravirine (PIFELTRO) tablet 100 mg        100 mg Oral Daily 11/07/20 1451     11/02/20 1130  ceFAZolin (ANCEF) IVPB 2g/100  mL premix        2 g 200 mL/hr over 30 Minutes Intravenous To Radiology 11/02/20 1042 11/02/20 1704   10/27/20 1245  ceFAZolin (ANCEF) IVPB 3g/100 mL premix        3 g 200 mL/hr over 30 Minutes Intravenous On call to O.R. 10/27/20 0755 10/27/20 1608   10/20/20 1415  linezolid (ZYVOX) tablet 600 mg  Status:  Discontinued        600 mg Oral Every 12 hours 10/20/20 1326 10/28/20 1115   10/19/20 1445  metroNIDAZOLE (FLAGYL) tablet 500 mg  Status:  Discontinued        500 mg Oral Every 12 hours 10/19/20 1347 10/28/20 1115   10/18/20 1600  ceFEPIme (MAXIPIME) 2 g in sodium chloride 0.9 % 100 mL IVPB  Status:  Discontinued        2 g 200 mL/hr over 30 Minutes Intravenous Every 12 hours 10/18/20 0414 10/28/20 1112   10/18/20 1000  dapsone tablet 100 mg        100 mg Oral Daily 10/18/20 0425     10/18/20 1000  bictegravir-emtricitabine-tenofovir AF (BIKTARVY) 50-200-25 MG per tablet 1 tablet  Status:  Discontinued  1 tablet Oral Daily 10/18/20 0610 11/07/20 1451   10/18/20 0415  metroNIDAZOLE (FLAGYL) IVPB 500 mg  Status:  Discontinued        500 mg 100 mL/hr over 60 Minutes Intravenous Every 8 hours 10/18/20 0404 10/19/20 1347   10/18/20 0415  ceFEPIme (MAXIPIME) 2 g in sodium chloride 0.9 % 100 mL IVPB        2 g 200 mL/hr over 30 Minutes Intravenous  Once 10/18/20 0414 10/18/20 0500   10/18/20 0345  vancomycin (VANCOREADY) IVPB 2000 mg/400 mL        2,000 mg 200 mL/hr over 120 Minutes Intravenous  Once 10/18/20 0335 10/18/20 0608       PRN meds: acetaminophen, alum & mag hydroxide-simeth, bisacodyl, diphenoxylate-atropine, guaiFENesin-dextromethorphan, hydrALAZINE, HYDROmorphone (DILAUDID) injection, labetalol, magnesium sulfate bolus IVPB, metoprolol tartrate, ondansetron, oxyCODONE, oxyCODONE, phenol, polyethylene glycol, potassium chloride   Objective: Vitals:   12/07/20 0538 12/07/20 0725  BP: 107/63 (!) 146/99  Pulse: 76 76  Resp: 19 17  Temp: 99.3 F (37.4 C) 98.5 F  (36.9 C)  SpO2: 97% 99%    Intake/Output Summary (Last 24 hours) at 12/07/2020 1338 Last data filed at 12/06/2020 2127 Gross per 24 hour  Intake --  Output 0 ml  Net 0 ml    Filed Weights   11/20/20 0618 11/23/20 0618 11/23/20 2144  Weight: 117.3 kg 121.4 kg 121.4 kg   Weight change:  Body mass index is 38.4 kg/m.   Physical Exam: General exam: Pleasant, middle-aged African-American male.  Not in physical distress skin: No rashes, lesions or ulcers. HEENT: Atraumatic, normocephalic, no obvious bleeding Lungs: Clear to auscultation bilaterally CVS: Regular rate and rhythm, no murmur GI/Abd soft, nontender, nondistended, bowel sound present CNS: Alert, awake, oriented x3 Psychiatry: Mood appropriate Extremities: Prior right BKA status. New left BKA with bandage around lower leg.  Not bleeding at this time.  Data Review: I have personally reviewed the laboratory data and studies available.  Recent Labs  Lab 12/03/20 0148 12/04/20 0305 12/04/20 1457 12/04/20 2136 12/05/20 0314 12/06/20 0122 12/07/20 0201  WBC 16.1* 14.1*  --   --  12.8* 12.6* 11.7*  NEUTROABS 14.0* 10.7*  --   --  9.7* 10.1* 8.9*  HGB 7.1* 6.2* 6.9* 8.0* 8.1* 8.6* 7.9*  HCT 21.1* 19.5* 21.3* 24.8* 24.9* 25.9* 24.6*  MCV 89.8 91.1  --   --  91.2 91.5 91.4  PLT 404* 390  --   --  356 378 410*    Recent Labs  Lab 12/03/20 0148 12/04/20 0305 12/04/20 2136 12/05/20 0314 12/06/20 0122 12/07/20 0201  NA 130* 133* 132*  --  134* 133*  K 4.9 4.8 4.5  --  4.5 4.6  CL 100 102 103  --  104 101  CO2 20* 21* 21*  --  22 23  GLUCOSE 160* 131* 122*  --  117* 135*  BUN 25* 29* 27*  --  25* 25*  CREATININE 2.24* 2.49* 2.15*  --  2.03* 2.15*  CALCIUM 8.3* 8.7* 8.4*  --  8.7* 8.6*  MG 1.8 2.0  --  2.0 1.9 2.2  PHOS 3.0 3.7 4.2  --  4.0 4.3     F/u labs ordered Unresulted Labs (From admission, onward)     Start     Ordered   12/07/20 0500  HCV RNA quant rflx ultra or genotyp  Tomorrow morning,   R        Question:  Specimen collection method  Answer:  Lab=Lab collect   12/06/20 1027   12/07/20 0500  T-helper cells (CD4) count (not at [facility])  Tomorrow morning,   R       Question:  Specimen collection method  Answer:  Lab=Lab collect   12/06/20 1027   12/03/20 0500  CBC  Daily,   R     Question:  Specimen collection method  Answer:  Lab=Lab collect   12/02/20 1134   11/21/20 0500  CBC with Differential/Platelet  Daily,   R     Question:  Specimen collection method  Answer:  Lab=Lab collect   11/20/20 1010   11/21/20 0500  Magnesium  Daily,   R     Question:  Specimen collection method  Answer:  Lab=Lab collect   11/20/20 1010   11/02/20 0500  Renal function panel  Daily,   R     Question:  Specimen collection method  Answer:  Lab=Lab collect   11/01/20 1153            Signed, Terrilee Croak, MD [facility] 12/07/2020

## 2020-12-07 NOTE — TOC Progression Note (Signed)
Transition of Care [facility]) - Progression Note    Patient Details  Name: MIKEN DESROCHERS MRN: [name] Date of Birth: 02/08/1966  [facility] CM/SW Contact  Milinda Antis, LCSWA Phone Number: 12/07/2020, 12:56 PM  Clinical Narrative:    CSW was contacted by [name] with [employer].  Insurance authorization is still pending.    Expected Discharge Plan: Skilled Nursing Facility Barriers to Discharge: SNF Pending bed offer  Expected Discharge Plan and Services Expected Discharge Plan: [place] In-house Referral: Clinical Social Work Discharge Planning Services: CM Consult Post Acute Care Choice: [place] arrangements for the past 2 months: Single Family Home                                       Social Determinants of Health (SDOH) Interventions    Readmission Risk Interventions Readmission Risk Prevention Plan 10/21/2020 11/13/2018 11/13/2018  Transportation Screening Complete - Complete  PCP or Specialist Appt within 3-5 Days - - -  HRI or [place] Work Consult for [place] - - -  Medication Review Press photographer) Complete - Complete  PCP or Specialist appointment within 3-5 days of discharge Complete Complete -  [place] or Home Care Consult Complete Complete -  SW Recovery Care/Counseling Consult Complete Complete -  Palliative Care Screening Not Applicable Not Applicable -  [place] Complete Not Applicable -  Some encounter information is confidential and restricted. Go to Review Flowsheets activity to see all data.  Some recent data might be hidden

## 2020-12-07 NOTE — Progress Notes (Signed)
PT Cancellation Note  Patient Details Name: Keith [name] MRN: [name] DOB: 1965-11-13   Cancelled Treatment:    Reason Eval/Treat Not Completed: Patient declined, no reason specified (Pt reports he is not willing to due therapy today, as it is "too late in the day" for him to participate.  Will continue to follow per POC.)   [name] [name] 12/07/2020, 5:20 PM  [name] , PTA Acute Rehabilitation Services Pager [phone] Office [phone]

## 2020-12-07 NOTE — Progress Notes (Signed)
And is status post below-knee amputation revision he is comfortable in bed anticipates going to skilled nursing   Vital signs stable alert pleasant to exam wound VAC is functioning well with 2 green checks 0 cc in the canister   From an orthopedic standpoint can discharge when skilled nursing bed available

## 2020-12-07 NOTE — Progress Notes (Signed)
Patient continues to call out for pain meds, requesting that he receive dilaudid. However, whenever the nurse goes in to assess patient's pain level, patient is found peacefully sleeping and does not appear to be in pain. Patient has formed a habit of refusing his scheduled oxycodone for dilaudid, but is always drowsy.

## 2020-12-08 DIAGNOSIS — N179 Acute kidney failure, unspecified: Secondary | ICD-10-CM | POA: Diagnosis not present

## 2020-12-08 LAB — CBC WITH DIFFERENTIAL/PLATELET
Abs Immature Granulocytes: 0.09 10*3/uL — ABNORMAL HIGH (ref 0.00–0.07)
Basophils Absolute: 0 10*3/uL (ref 0.0–0.1)
Basophils Relative: 0 %
Eosinophils Absolute: 0.4 10*3/uL (ref 0.0–0.5)
Eosinophils Relative: 5 %
HCT: 24.5 % — ABNORMAL LOW (ref 39.0–52.0)
Hemoglobin: 7.8 g/dL — ABNORMAL LOW (ref 13.0–17.0)
Immature Granulocytes: 1 %
Lymphocytes Relative: 15 %
Lymphs Abs: 1.5 10*3/uL (ref 0.7–4.0)
MCH: 29.4 pg (ref 26.0–34.0)
MCHC: 31.8 g/dL (ref 30.0–36.0)
MCV: 92.5 fL (ref 80.0–100.0)
Monocytes Absolute: 0.6 10*3/uL (ref 0.1–1.0)
Monocytes Relative: 7 %
Neutro Abs: 6.9 10*3/uL (ref 1.7–7.7)
Neutrophils Relative %: 72 %
Platelets: 410 10*3/uL — ABNORMAL HIGH (ref 150–400)
RBC: 2.65 MIL/uL — ABNORMAL LOW (ref 4.22–5.81)
RDW: 14.2 % (ref 11.5–15.5)
WBC: 9.6 10*3/uL (ref 4.0–10.5)
nRBC: 0.2 % (ref 0.0–0.2)

## 2020-12-08 LAB — RENAL FUNCTION PANEL
Albumin: 2.2 g/dL — ABNORMAL LOW (ref 3.5–5.0)
Anion gap: 8 (ref 5–15)
BUN: 23 mg/dL — ABNORMAL HIGH (ref 6–20)
CO2: 25 mmol/L (ref 22–32)
Calcium: 9.1 mg/dL (ref 8.9–10.3)
Chloride: 102 mmol/L (ref 98–111)
Creatinine, Ser: 2.01 mg/dL — ABNORMAL HIGH (ref 0.61–1.24)
GFR, Estimated: 38 mL/min — ABNORMAL LOW (ref >60)
Glucose, Bld: 134 mg/dL — ABNORMAL HIGH (ref 70–99)
Phosphorus: 4.5 mg/dL (ref 2.5–4.6)
Potassium: 4.6 mmol/L (ref 3.5–5.1)
Sodium: 135 mmol/L (ref 135–145)

## 2020-12-08 LAB — MAGNESIUM: Magnesium: 2 mg/dL (ref 1.7–2.4)

## 2020-12-08 LAB — HCV RNA QUANT RFLX ULTRA OR GENOTYP
HCV RNA Qnt(log copy/mL): UNDETERMINED log10 IU/mL
HepC Qn: NOT DETECTED IU/mL

## 2020-12-08 NOTE — Progress Notes (Signed)
Physical Therapy Treatment Patient Details Name: Keith [name] MRN: [name] DOB: 15-Apr-1965 Today's Date: 12/08/2020    History of Present Illness 54 yo male admitted 10/17/20 after being sent to ED by his orthopedic provider with concerns for L heel ulcer.  He has failed conservative measure and presents with s/p L BKA on 10/27/20. s/p revision to L BKA on 11/17/20 & again on 12/02/20.  PMH: HIV, CKD 3, DM type 2, HTN, HLD, CAD, R BKA.    PT Comments    Pt seated in recliner requesting back to bed.  Pt continues to benefit from skilled rehab in a post acute setting.  He required assistance to prepare for transfer and stabilize chair as he pushed against it with R leg.  Educated on wear R prosthesis to improve mobility.      Follow Up Recommendations  SNF     Equipment Recommendations  Wheelchair (measurements PT);Wheelchair cushion (measurements PT)    Recommendations for Other Services OT consult     Precautions / Restrictions Precautions Precautions: Fall Precaution Comments: B BKA's: R healed,  L BKA rev 8/27, RLE prosthesis, wound vac L leg Required Braces or Orthoses: Other Brace Other Brace: L limb protector to be worn when up and OOB - pt is non compliant with use. Restrictions Weight Bearing Restrictions: Yes LLE Weight Bearing: Non weight bearing Other Position/Activity Restrictions: NWB on left    Mobility  Bed Mobility Overal bed mobility: Needs Assistance Bed Mobility: Supine to Sit;Sit to Supine Rolling: Supervision     Sit to supine: Supervision   General bed mobility comments: Gravity assistance to move into supine.  Once in supine he required rolling to place bad pad to assist hips into appropriate position.    Transfers Overall transfer level: Needs assistance Equipment used: None Transfers: Lateral/Scoot Transfers;Anterior-Posterior Transfer (modified posterior/lateral scoot back to bed from drop arm recliner.)       Anterior-Posterior transfers:  Min assist  Lateral/Scoot Transfers: Min assist General transfer comment: Cues for hand placement this session, pt required assistance and cues for problem solving to move from chair to bed.  poor trunk control.  Ambulation/Gait Ambulation/Gait assistance:  (refused to donn prosthesis.)               Stairs             Wheelchair Mobility    Modified Rankin (Stroke Patients Only)       Balance Overall balance assessment: Needs assistance Sitting-balance support: No upper extremity supported;Feet supported Sitting balance-Leahy Scale: Good       Standing balance-Leahy Scale: Poor Standing balance comment: min-modA with BUE support of RW                            Cognition Arousal/Alertness: Awake/alert Behavior During Therapy: Flat affect Overall Cognitive Status: Impaired/Different from baseline Area of Impairment: Safety/judgement;Following commands;Problem solving                       Following Commands: Follows one step commands with increased time;Follows one step commands consistently;Follows multi-step commands inconsistently Safety/Judgement: Decreased awareness of safety;Decreased awareness of deficits   Problem Solving: Slow processing;Difficulty sequencing;Requires verbal cues General Comments: Pt offered RLE prothesis to improve ease during lateral scoot back to bed.  He refused to wear the limb guard and required increased assistance and cueing to move from recliner back to surface of bed.      Exercises  General Comments        Pertinent Vitals/Pain Pain Assessment: Faces Faces Pain Scale: Hurts a little bit Pain Location: L residual limb Pain Descriptors / Indicators: Grimacing;Operative site guarding Pain Intervention(s): Monitored during session;Repositioned    Home Living                      Prior Function            PT Goals (current goals can now be found in the care plan section) Acute  Rehab PT Goals Patient Stated Goal: get out of the room Potential to Achieve Goals: Fair Additional Goals Additional Goal #1: Pt will propel wheelchair inluding turns and backwards 1000 ft independently, with no notable DOE Progress towards PT goals: Progressing toward goals    Frequency    Min 3X/week      PT Plan Current plan remains appropriate    Co-evaluation              AM-PAC PT "6 Clicks" Mobility   Outcome Measure  Help needed turning from your back to your side while in a flat bed without using bedrails?: None Help needed moving from lying on your back to sitting on the side of a flat bed without using bedrails?: None Help needed moving to and from a bed to a chair (including a wheelchair)?: A Little Help needed standing up from a chair using your arms (e.g., wheelchair or bedside chair)?: A Lot Help needed to walk in hospital room?: Total Help needed climbing 3-5 steps with a railing? : Total 6 Click Score: 15    End of Session   Activity Tolerance: Patient tolerated treatment well Patient left: with call bell/phone within reach;in bed;with bed alarm set Nurse Communication: Mobility status PT Visit Diagnosis: Unsteadiness on feet (R26.81);Other abnormalities of gait and mobility (R26.89);Pain;Muscle weakness (generalized) (M62.81);Difficulty in walking, not elsewhere classified (R26.2) Pain - Right/Left: Left Pain - part of body: Leg     Time: 1205-1213 PT Time Calculation (min) (ACUTE ONLY): 8 min  Charges:  $Therapeutic Activity: 8-22 mins                     Keith [name] , PTA Acute Rehabilitation Services Pager [phone] Office [phone]    Keith [name] 12/08/2020, 12:42 PM

## 2020-12-08 NOTE — Progress Notes (Signed)
Occupational Therapy Treatment Patient Details Name: Keith [name] MRN: [id number] DOB: [birth year] Today's Date: 12/08/2020    History of present illness 55 y.o. male  who is sent to the ED by his orthopedic surgery provider due to concern for left heel ulcer that had failed outpatient therapy, now s/p L BKA 10/27/20. s/p REVISIONs L AMPUTATION BELOW KNEE on 8/12, and 8/27;  with medical history significant for HIV, CKD 3B, type 2 diabetes, hypertension, hyperlipidemia, coronary artery disease, status post right below the knee amputation   OT comments  Pt. Seen for skilled OT session.  Initially hesitant for participation but agreeable to eob/oob.  S for bed mobility and lateral transfer to recliner.  Requesting multiple times for w/c and to go outside.  Cna notified of pt. Request.  Pt. Denied limb protector for L LE while up.  Reviewed it is indicated, he cont. To state he did not need it and was told he did not have to wear it.    Follow Up Recommendations  SNF    Equipment Recommendations  None recommended by OT    Recommendations for Other Services      Precautions / Restrictions Precautions Precautions: Fall Precaution Comments: B BKA's: R healed,  L BKA rev 8/27, RLE prosthesis, wound vac L leg Required Braces or Orthoses: Other Brace Other Brace: limb protector on left Restrictions Other Position/Activity Restrictions: NWB on left       Mobility Bed Mobility Overal bed mobility: Needs Assistance Bed Mobility: Supine to Sit Rolling: Supervision              Transfers Overall transfer level: Needs assistance Equipment used: None Transfers: Lateral/Scoot Transfers          Lateral/Scoot Transfers: Supervision      Balance                                           ADL either performed or assessed with clinical judgement   ADL Overall ADL's : Needs assistance/impaired                           Toilet Transfer Details  (indicate cue type and reason): simulated via lateral scoot from eob to drop arm recliner to pt.s R side         Functional mobility during ADLs: Minimal assistance;Cueing for safety;Cueing for sequencing General ADL Comments: Session focused on funcitonal mobility and safe transfer techniques     Vision       Perception     Praxis      Cognition Arousal/Alertness: Awake/alert Behavior During Therapy: Flat affect Overall Cognitive Status: Impaired/Different from baseline                                          Exercises     Shoulder Instructions       General Comments      Pertinent Vitals/ Pain       Pain Assessment: Faces Faces Pain Scale: Hurts a little bit Pain Location: L residual limb Pain Descriptors / Indicators: Grimacing;Operative site guarding Pain Intervention(s): Limited activity within patient's tolerance;Monitored during session;Premedicated before session;Repositioned  Home Living  Prior Functioning/Environment              Frequency  Min 2X/week        Progress Toward Goals  OT Goals(current goals can now be found in the care plan section)  Progress towards OT goals: Progressing toward goals     Plan Discharge plan remains appropriate;Frequency remains appropriate    Co-evaluation                 AM-PAC OT "6 Clicks" Daily Activity     Outcome Measure   Help from another person eating meals?: None Help from another person taking care of personal grooming?: A Little Help from another person toileting, which includes using toliet, bedpan, or urinal?: A Lot Help from another person bathing (including washing, rinsing, drying)?: A Lot Help from another person to put on and taking off regular upper body clothing?: A Little Help from another person to put on and taking off regular lower body clothing?: A Lot 6 Click Score: 16    End of Session     OT Visit Diagnosis: Other abnormalities of gait and mobility (R26.89);Muscle weakness (generalized) (M62.81);Pain Pain - Right/Left: Left Pain - part of body: Leg   Activity Tolerance Patient tolerated treatment well   Patient Left in chair;with call bell/phone within reach;with chair alarm set   Nurse Communication Other (comment) (notified cna pt. request to have bed changed while he was up and also to be taken outside for fresh air in w/c)        Time: [id number] OT Time Calculation (min): 13 min  Charges: OT General Charges $OT Visit: 1 Visit OT Treatments $Therapeutic Activity: 8-22 mins  Sonia Baller, COTA/L Acute Rehabilitation [phone]    Tanya Nones 12/08/2020, 12:01 PM

## 2020-12-08 NOTE — Progress Notes (Signed)
PROGRESS NOTE  Keith [name]  DOB: 1965/07/09  [name], NP [id number]  DOA: 10/17/2020  LOS: 76 days  Hospital Day: 83   Chief Complaint  Patient presents with   Foot Ulcer    Brief narrative: Keith [name] is a 55 y.o. male with PMH significant for DM2, HTN, HLD, CAD, CKD 3B, HIV, right BKA status.  Patient was sent to the ED on 7/12 by his orthopedic surgeon due to concern of left heel ulcer that failed outpatient therapy.   Subsequently, MRI confirmed osteomyelitis.  He underwent left transtibial amputation by [name].  During the hospitalization, he had worsening renal function.  Nephrology was consulted.  He had temporary dialysis catheter placed and dialysis was started.  Later, he developed hematuria and required fulguration and CBI along with blood transfusions.  Renal function improved; dialysis was stopped and nephrology signed off.  Subsequently, his wound dehisced with bleeding requiring blood transfusion.   See below for details.  Subjective: Patient was seen and examined in the afternoon.  Lying on bed.  Not in distress.  No new symptoms.   Pending SNF.   Renal function improving.  Hemoglobin stable.  Assessment/Plan: Acute kidney injury with metabolic acidosis  -Patient presented with worsening renal functions.  He was started on dialysis, but his renal function recovered and dialysis was subsequently stopped.  His AKI was felt to be due to ATN.  Renal ultrasound was negative for hydronephrosis. -Currently creatinine staying above 2.  Stable.  Currently remains off IV fluid. Recent Labs    11/29/20 0721 11/30/20 0457 12/01/20 0232 12/02/20 0040 12/03/20 0148 12/04/20 0305 12/04/20 2136 12/06/20 0122 12/07/20 0201 12/08/20 0152  BUN 29* 29* 27* 24* 25* 29* 27* 25* 25* 23*  CREATININE 2.38* 2.30* 2.03* 2.04* 2.24* 2.49* 2.15* 2.03* 2.15* 2.01*  CO2 '25 22 22 23 '$ 20* 21* 21* '22 23 25   '$ Left heel osteomyelitis Recurrent wound dehiscence  and bleeding -Patient has a previous right BKA status. -7/22, patient had left foot transtibial amputation which failed leading to wound dehiscence and bleeding.  -8/12, patient had revision amputation below-knee.  Patient continued to have poor wound healing and bleeding.   -8/27, patient had revision left BKA. -Currently pain controlled with Dilaudid 0.5 mg every 6 hours and oxycodone as needed.  Continue to taper down IV Dilaudid.    Hematuria Acute urinary retention -11/08/2020 -Foley catheter placement with drainage of clots.  Urology was consulted  -8/4 - underwent cystoscopy with fulguration and started on CBI and Proscar.  Subsequently CBI was discontinued. -8/15-Foley catheter discontinued. -8/22-patient had another episode of hematuria and clots. Urology recommended Proscar, Uroxatrol and IV hydration. Hematuria has now improved.   Anemia due to acute blood loss from bladder and left stump -Hemoglobin at 7.8 today.  13 units of PRBC transfused so far this admission.  -Continue monitor hemoglobin. Recent Labs    04/26/20 1228 04/27/20 0802 12/04/20 2136 12/05/20 0314 12/06/20 0122 12/07/20 0201 12/08/20 0152  HGB 10.7*   < > 8.0* 8.1* 8.6* 7.9* 7.8*  MCV 84.5   < >  --  91.2 91.5 91.4 92.5  VITAMINB12 451  --   --   --   --   --   --   FOLATE 8.7  --   --   --   --   --   --   FERRITIN 347*  --   --   --   --   --   --  TIBC 276  --   --   --   --   --   --   IRON 71  --   --   --   --   --   --   RETICCTPCT 1.8  --   --   --   --   --   --    < > = values in this interval not displayed.    Uncontrolled type 2 diabetes mellitus Hyperglycemia -A1c was 13.9 on May 2022.  Repeat A1c on 8/24 was at 5.9.  Wonder if it is truly low because of diet control in the hospital or spuriously low due to frequent blood loss and multiple transfusions.   -Currently on Jardiance 10 mg daily and sliding scale insulin.  Blood sugar is consistently under 150.  Okay to stop fingerstick  blood sugar check.  CAD status post stenting in 03/2020 -Continue aspirin.  Because of recurrent bleeding, Brilinta was discontinued by cardiology on 11/19/2020.   -Once bleeding stabilizes, will plan to resume Brilinta.    HLD  -continue Vascepa and Lipitor  HIV -Continue current regimen of antiretrovirals and dapsone as per ID.   -Outpatient follow-up with ID   Mobility: PT eval obtained.  SNF recommended Code Status:   Code Status: Full Code  Nutritional status: Body mass index is 38.4 kg/m. Nutrition Problem: Increased nutrient needs Etiology: post-op healing Signs/Symptoms: estimated needs Diet:  Diet Order             Diet Carb Modified Fluid consistency: Thin; Room service appropriate? Yes  Diet effective now                  DVT prophylaxis: Not on heparin products because of frequent bleeding.  Bilateral BKA status, unable to use SCDs SCD's Start: 12/02/20 1135 Antimicrobials: None Fluid: Not on IV fluid Consultants: Orthopedics Family Communication: None at bedside  Status is: Inpatient  Remains inpatient appropriate because: Pending SNF  Dispo: The patient is from: Home              Anticipated d/c is to: SNF               Patient currently is medically stable to d/c.   Difficult to place patient No     Infusions:   magnesium sulfate bolus IVPB      Scheduled Meds:  (feeding supplement) PROSource Plus  30 mL Oral BID BM   alfuzosin  10 mg Oral Q breakfast   allopurinol  100 mg Oral Daily   vitamin C  1,000 mg Oral Daily   aspirin EC  81 mg Oral Daily   atorvastatin  80 mg Oral Daily   dapsone  100 mg Oral Daily   docusate sodium  100 mg Oral Daily   dolutegravir  50 mg Oral Daily   doravirine  100 mg Oral Daily   empagliflozin  10 mg Oral Daily   escitalopram  20 mg Oral Daily   finasteride  5 mg Oral Daily   gabapentin  100 mg Oral TID   icosapent Ethyl  2 g Oral BID   lamiVUDine  300 mg Oral Daily   multivitamin with minerals  1  tablet Oral Q24H   oxybutynin  2.5 mg Oral BID   oxyCODONE  10 mg Oral QID   pantoprazole  40 mg Oral Daily   senna  2 tablet Oral QHS   zinc sulfate  220 mg Oral Daily  Antimicrobials: Anti-infectives (From admission, onward)    Start     Dose/Rate Route Frequency Ordered Stop   12/02/20 1230  ceFAZolin (ANCEF) IVPB 2g/100 mL premix        2 g 200 mL/hr over 30 Minutes Intravenous Every 8 hours 12/02/20 1134 12/02/20 2245   12/02/20 0600  ceFAZolin (ANCEF) IVPB 3g/100 mL premix        3 g 200 mL/hr over 30 Minutes Intravenous On call to O.R. 12/01/20 1909 12/02/20 0950   12/01/20 0600  ceFAZolin (ANCEF) IVPB 2g/100 mL premix  Status:  Discontinued        2 g 200 mL/hr over 30 Minutes Intravenous On call to O.R. 11/30/20 1817 11/30/20 1819   12/01/20 0600  ceFAZolin (ANCEF) IVPB 3g/100 mL premix        3 g 200 mL/hr over 30 Minutes Intravenous On call to O.R. 11/30/20 1820 12/02/20 0559   11/18/20 0100  ceFAZolin (ANCEF) IVPB 2g/100 mL premix        2 g 200 mL/hr over 30 Minutes Intravenous Every 8 hours 11/17/20 2045 11/18/20 1038   11/17/20 0600  ceFAZolin (ANCEF) IVPB 3g/100 mL premix        3 g 200 mL/hr over 30 Minutes Intravenous On call to O.R. 11/16/20 1941 11/17/20 1901   11/11/20 1200  vancomycin (VANCOREADY) IVPB 500 mg/100 mL  Status:  Discontinued        500 mg 100 mL/hr over 60 Minutes Intravenous Every T-Th-Sa (Hemodialysis) 11/09/20 1414 11/11/20 1328   11/10/20 0000  Dolutegravir-lamiVUDine (DOVATO) 50-300 MG TABS        1 tablet Oral Daily 11/10/20 1048     11/10/20 0000  doravirine (PIFELTRO) 100 MG TABS tablet        100 mg Oral Daily 11/10/20 1048     11/08/20 1000  dolutegravir (TIVICAY) tablet 50 mg        50 mg Oral Daily 11/07/20 1451     11/08/20 1000  lamiVUDine (EPIVIR) tablet 300 mg        300 mg Oral Daily 11/07/20 1451     11/08/20 1000  doravirine (PIFELTRO) tablet 100 mg        100 mg Oral Daily 11/07/20 1451     11/02/20 1130  ceFAZolin  (ANCEF) IVPB 2g/100 mL premix        2 g 200 mL/hr over 30 Minutes Intravenous To Radiology 11/02/20 1042 11/02/20 1704   10/27/20 1245  ceFAZolin (ANCEF) IVPB 3g/100 mL premix        3 g 200 mL/hr over 30 Minutes Intravenous On call to O.R. 10/27/20 0755 10/27/20 1608   10/20/20 1415  linezolid (ZYVOX) tablet 600 mg  Status:  Discontinued        600 mg Oral Every 12 hours 10/20/20 1326 10/28/20 1115   10/19/20 1445  metroNIDAZOLE (FLAGYL) tablet 500 mg  Status:  Discontinued        500 mg Oral Every 12 hours 10/19/20 1347 10/28/20 1115   10/18/20 1600  ceFEPIme (MAXIPIME) 2 g in sodium chloride 0.9 % 100 mL IVPB  Status:  Discontinued        2 g 200 mL/hr over 30 Minutes Intravenous Every 12 hours 10/18/20 0414 10/28/20 1112   10/18/20 1000  dapsone tablet 100 mg        100 mg Oral Daily 10/18/20 0425     10/18/20 1000  bictegravir-emtricitabine-tenofovir AF (BIKTARVY) 50-200-25 MG per tablet 1 tablet  Status:  Discontinued        1 tablet Oral Daily 10/18/20 0610 11/07/20 1451   10/18/20 0415  metroNIDAZOLE (FLAGYL) IVPB 500 mg  Status:  Discontinued        500 mg 100 mL/hr over 60 Minutes Intravenous Every 8 hours 10/18/20 0404 10/19/20 1347   10/18/20 0415  ceFEPIme (MAXIPIME) 2 g in sodium chloride 0.9 % 100 mL IVPB        2 g 200 mL/hr over 30 Minutes Intravenous  Once 10/18/20 0414 10/18/20 0500   10/18/20 0345  vancomycin (VANCOREADY) IVPB 2000 mg/400 mL        2,000 mg 200 mL/hr over 120 Minutes Intravenous  Once 10/18/20 0335 10/18/20 0608       PRN meds: acetaminophen, alum & mag hydroxide-simeth, bisacodyl, diphenoxylate-atropine, guaiFENesin-dextromethorphan, hydrALAZINE, HYDROmorphone (DILAUDID) injection, labetalol, magnesium sulfate bolus IVPB, metoprolol tartrate, ondansetron, oxyCODONE, oxyCODONE, phenol, polyethylene glycol, potassium chloride   Objective: Vitals:   12/08/20 0810 12/08/20 1252  BP: 129/83 105/68  Pulse: 73 81  Resp: 17 17  Temp: 98.8 F (37.1  C) 98.7 F (37.1 C)  SpO2: 98% 97%    Intake/Output Summary (Last 24 hours) at 12/08/2020 1611 Last data filed at 12/08/2020 0325 Gross per 24 hour  Intake 240 ml  Output 1800 ml  Net -1560 ml    Filed Weights   11/20/20 0618 11/23/20 0618 11/23/20 2144  Weight: 117.3 kg 121.4 kg 121.4 kg   Weight change:  Body mass index is 38.4 kg/m.   Physical Exam: General exam: Pleasant, middle-aged African-American male.  Not in physical distress skin: No rashes, lesions or ulcers. HEENT: Atraumatic, normocephalic, no obvious bleeding Lungs: Clear to auscultation bilaterally CVS: Regular rate and rhythm, no murmur GI/Abd soft, nontender, nondistended, bowel sound present CNS: Alert, awake, oriented x3 Psychiatry: Mood appropriate Extremities: Prior right BKA status. New left BKA with bandage around lower leg.  Not bleeding at this time.  Data Review: I have personally reviewed the laboratory data and studies available.  Recent Labs  Lab 12/04/20 0305 12/04/20 1457 12/04/20 2136 12/05/20 0314 12/06/20 0122 12/07/20 0201 12/08/20 0152  WBC 14.1*  --   --  12.8* 12.6* 11.7* 9.6  NEUTROABS 10.7*  --   --  9.7* 10.1* 8.9* 6.9  HGB 6.2*   < > 8.0* 8.1* 8.6* 7.9* 7.8*  HCT 19.5*   < > 24.8* 24.9* 25.9* 24.6* 24.5*  MCV 91.1  --   --  91.2 91.5 91.4 92.5  PLT 390  --   --  356 378 410* 410*   < > = values in this interval not displayed.    Recent Labs  Lab 12/04/20 0305 12/04/20 2136 12/05/20 0314 12/06/20 0122 12/07/20 0201 12/08/20 0152  NA 133* 132*  --  134* 133* 135  K 4.8 4.5  --  4.5 4.6 4.6  CL 102 103  --  104 101 102  CO2 21* 21*  --  '22 23 25  '$ GLUCOSE 131* 122*  --  117* 135* 134*  BUN 29* 27*  --  25* 25* 23*  CREATININE 2.49* 2.15*  --  2.03* 2.15* 2.01*  CALCIUM 8.7* 8.4*  --  8.7* 8.6* 9.1  MG 2.0  --  2.0 1.9 2.2 2.0  PHOS 3.7 4.2  --  4.0 4.3 4.5     F/u labs ordered Unresulted Labs (From admission, onward)     Start     Ordered   12/07/20 0500   T-helper cells (  CD4) count (not at [facility])  Tomorrow morning,   R       Question:  Specimen collection method  Answer:  Lab=Lab collect   12/06/20 1027   12/03/20 0500  CBC  Daily,   R     Question:  Specimen collection method  Answer:  Lab=Lab collect   12/02/20 1134   11/21/20 0500  CBC with Differential/Platelet  Daily,   R     Question:  Specimen collection method  Answer:  Lab=Lab collect   11/20/20 1010   11/21/20 0500  Magnesium  Daily,   R     Question:  Specimen collection method  Answer:  Lab=Lab collect   11/20/20 1010   11/02/20 0500  Renal function panel  Daily,   R     Question:  Specimen collection method  Answer:  Lab=Lab collect   11/01/20 1153            Signed, Terrilee Croak, MD [facility] 12/08/2020

## 2020-12-08 NOTE — Plan of Care (Signed)
  Problem: Clinical Measurements: Goal: Ability to avoid or minimize complications of infection will improve Outcome: Progressing   Problem: Skin Integrity: Goal: Skin integrity will improve Outcome: Progressing   Problem: Clinical Measurements: Goal: Ability to maintain clinical measurements within normal limits will improve Outcome: Progressing Goal: Will remain free from infection Outcome: Progressing   Problem: Activity: Goal: Risk for activity intolerance will decrease Outcome: Progressing   Problem: Nutrition: Goal: Adequate nutrition will be maintained Outcome: Progressing   Problem: Elimination: Goal: Will not experience complications related to bowel motility Outcome: Progressing Goal: Will not experience complications related to urinary retention Outcome: Progressing   Problem: Pain Managment: Goal: General experience of comfort will improve Outcome: Progressing   Problem: Safety: Goal: Ability to remain free from injury will improve Outcome: Progressing   Problem: Skin Integrity: Goal: Risk for impaired skin integrity will decrease Outcome: Progressing

## 2020-12-09 DIAGNOSIS — N179 Acute kidney failure, unspecified: Secondary | ICD-10-CM | POA: Diagnosis not present

## 2020-12-09 LAB — CBC WITH DIFFERENTIAL/PLATELET
Abs Immature Granulocytes: 0.1 10*3/uL — ABNORMAL HIGH (ref 0.00–0.07)
Basophils Absolute: 0.1 10*3/uL (ref 0.0–0.1)
Basophils Relative: 1 %
Eosinophils Absolute: 0.4 10*3/uL (ref 0.0–0.5)
Eosinophils Relative: 4 %
HCT: 26.6 % — ABNORMAL LOW (ref 39.0–52.0)
Hemoglobin: 8.6 g/dL — ABNORMAL LOW (ref 13.0–17.0)
Immature Granulocytes: 1 %
Lymphocytes Relative: 15 %
Lymphs Abs: 1.6 10*3/uL (ref 0.7–4.0)
MCH: 30 pg (ref 26.0–34.0)
MCHC: 32.3 g/dL (ref 30.0–36.0)
MCV: 92.7 fL (ref 80.0–100.0)
Monocytes Absolute: 0.7 10*3/uL (ref 0.1–1.0)
Monocytes Relative: 7 %
Neutro Abs: 8.2 10*3/uL — ABNORMAL HIGH (ref 1.7–7.7)
Neutrophils Relative %: 72 %
Platelets: 420 10*3/uL — ABNORMAL HIGH (ref 150–400)
RBC: 2.87 MIL/uL — ABNORMAL LOW (ref 4.22–5.81)
RDW: 14.4 % (ref 11.5–15.5)
WBC: 11.1 10*3/uL — ABNORMAL HIGH (ref 4.0–10.5)
nRBC: 0 % (ref 0.0–0.2)

## 2020-12-09 LAB — RENAL FUNCTION PANEL
Albumin: 2.3 g/dL — ABNORMAL LOW (ref 3.5–5.0)
Anion gap: 9 (ref 5–15)
BUN: 25 mg/dL — ABNORMAL HIGH (ref 6–20)
CO2: 26 mmol/L (ref 22–32)
Calcium: 9.1 mg/dL (ref 8.9–10.3)
Chloride: 100 mmol/L (ref 98–111)
Creatinine, Ser: 2.09 mg/dL — ABNORMAL HIGH (ref 0.61–1.24)
GFR, Estimated: 36 mL/min — ABNORMAL LOW (ref >60)
Glucose, Bld: 142 mg/dL — ABNORMAL HIGH (ref 70–99)
Phosphorus: 4.8 mg/dL — ABNORMAL HIGH (ref 2.5–4.6)
Potassium: 5.1 mmol/L (ref 3.5–5.1)
Sodium: 135 mmol/L (ref 135–145)

## 2020-12-09 LAB — MAGNESIUM: Magnesium: 2.1 mg/dL (ref 1.7–2.4)

## 2020-12-09 LAB — GLUCOSE, CAPILLARY: Glucose-Capillary: 165 mg/dL — ABNORMAL HIGH (ref 70–99)

## 2020-12-09 NOTE — Plan of Care (Signed)
  Problem: Clinical Measurements: Goal: Ability to avoid or minimize complications of infection will improve Outcome: Progressing   Problem: Skin Integrity: Goal: Skin integrity will improve Outcome: Progressing   Problem: Clinical Measurements: Goal: Ability to maintain clinical measurements within normal limits will improve Outcome: Progressing Goal: Will remain free from infection Outcome: Progressing   Problem: Activity: Goal: Risk for activity intolerance will decrease Outcome: Progressing   Problem: Nutrition: Goal: Adequate nutrition will be maintained Outcome: Progressing   Problem: Elimination: Goal: Will not experience complications related to bowel motility Outcome: Progressing Goal: Will not experience complications related to urinary retention Outcome: Progressing   Problem: Pain Managment: Goal: General experience of comfort will improve Outcome: Progressing   Problem: Safety: Goal: Ability to remain free from injury will improve Outcome: Progressing   Problem: Skin Integrity: Goal: Risk for impaired skin integrity will decrease Outcome: Progressing

## 2020-12-09 NOTE — Progress Notes (Signed)
Patient is being encouraged to get out of bed to chair but states he will do so after breakfast, then said he will move to the chair after lunch. Will continue to motivate patient.

## 2020-12-09 NOTE — Progress Notes (Signed)
PROGRESS NOTE  Keith [name]  DOB: [birth year]  [name], NP [id number]  DOA: 10/17/2020  LOS: 33 days  Hospital Day: 53   Chief Complaint  Patient presents with   Foot Ulcer    Brief narrative: Keith [name] is a 55 y.o. male with PMH significant for DM2, HTN, HLD, CAD, CKD 3B, HIV, right BKA status.  Patient was sent to the ED on 7/12 by his orthopedic surgeon due to concern of left heel ulcer that failed outpatient therapy.   Subsequently, MRI confirmed osteomyelitis.  He underwent left transtibial amputation by [name].  During the hospitalization, he had worsening renal function.  Nephrology was consulted.  He had temporary dialysis catheter placed and dialysis was started.  Later, he developed hematuria and required fulguration and CBI along with blood transfusions.  Renal function improved; dialysis was stopped and nephrology signed off.  Subsequently, his wound dehisced with bleeding requiring blood transfusion.   See below for details.  12/09/2020: Patient seen.  No new changes.  Awaiting skilled nursing facility placement.  Subjective: No new complaints. No chest pain. No shortness of breath.    Assessment/Plan: Acute kidney injury with metabolic acidosis  -Patient presented with worsening renal functions.  He was started on dialysis, but his renal function recovered and dialysis was subsequently stopped.  His AKI was felt to be due to ATN.  Renal ultrasound was negative for hydronephrosis. -Currently creatinine staying above 2.  Stable.  Currently remains off IV fluid. 12/09/2020: Patient actually has chronic kidney disease at baseline.  Stable. Recent Labs    11/30/20 0457 12/01/20 0232 12/02/20 0040 12/03/20 0148 12/04/20 0305 12/04/20 2136 12/06/20 0122 12/07/20 0201 12/08/20 0152 12/09/20 0122  BUN 29* 27* 24* 25* 29* 27* 25* 25* 23* 25*  CREATININE 2.30* 2.03* 2.04* 2.24* 2.49* 2.15* 2.03* 2.15* 2.01* 2.09*  CO2 '22 22 23 '$ 20* 21* 21* '22 23  25 26   '$ Left heel osteomyelitis Recurrent wound dehiscence and bleeding -Patient has a previous right BKA status. -7/22, patient had left foot transtibial amputation which failed leading to wound dehiscence and bleeding.  -8/12, patient had revision amputation below-knee.  Patient continued to have poor wound healing and bleeding.   -8/27, patient had revision left BKA. -Currently pain controlled with Dilaudid 0.5 mg every 6 hours and oxycodone as needed.  Continue to taper down IV Dilaudid. 12/09/2020: Patient is status post revision of left BKA.  Hematuria Acute urinary retention -11/08/2020 -Foley catheter placement with drainage of clots.  Urology was consulted  -8/4 - underwent cystoscopy with fulguration and started on CBI and Proscar.  Subsequently CBI was discontinued. -8/15-Foley catheter discontinued. -8/22-patient had another episode of hematuria and clots. Urology recommended Proscar, Uroxatrol and IV hydration. Hematuria has now improved.   Anemia due to acute blood loss from bladder and left stump -Hemoglobin at 7.8 today.  13 units of PRBC transfused so far this admission.  -Continue monitor hemoglobin. 12/09/2020: Anemia is likely multifactorial.  Patient also has chronic kidney disease, possibly stage IIIb. Recent Labs    04/26/20 1228 04/27/20 0802 12/05/20 0314 12/06/20 0122 12/07/20 0201 12/08/20 0152 12/09/20 0122  HGB 10.7*   < > 8.1* 8.6* 7.9* 7.8* 8.6*  MCV 84.5   < > 91.2 91.5 91.4 92.5 92.7  VITAMINB12 451  --   --   --   --   --   --   FOLATE 8.7  --   --   --   --   --   --  FERRITIN 347*  --   --   --   --   --   --   TIBC 276  --   --   --   --   --   --   IRON 71  --   --   --   --   --   --   RETICCTPCT 1.8  --   --   --   --   --   --    < > = values in this interval not displayed.    Uncontrolled type 2 diabetes mellitus Hyperglycemia -A1c was 13.9 on May 2022.  Repeat A1c on 8/24 was at 5.9.  Wonder if it is truly low because of diet control  in the hospital or spuriously low due to frequent blood loss and multiple transfusions.   -Currently on Jardiance 10 mg daily and sliding scale insulin.  Blood sugar is consistently under 150.  Okay to stop fingerstick blood sugar check.  CAD status post stenting in 03/2020 -Continue aspirin.  Because of recurrent bleeding, Brilinta was discontinued by cardiology on 11/19/2020.   -Once bleeding stabilizes, will plan to resume Brilinta.    HLD  -continue Vascepa and Lipitor  HIV -Continue current regimen of antiretrovirals and dapsone as per ID.   -Outpatient follow-up with ID   Mobility: PT eval obtained.  SNF recommended Code Status:   Code Status: Full Code  Nutritional status: Body mass index is 38.4 kg/m. Nutrition Problem: Increased nutrient needs Etiology: post-op healing Signs/Symptoms: estimated needs Diet:  Diet Order             Diet Carb Modified Fluid consistency: Thin; Room service appropriate? Yes  Diet effective now                  DVT prophylaxis: Not on heparin products because of frequent bleeding.  Bilateral BKA status, unable to use SCDs SCD's Start: 12/02/20 1135 Antimicrobials: None Fluid: Not on IV fluid Consultants: Orthopedics Family Communication: None at bedside  Status is: Inpatient  Remains inpatient appropriate because: Pending SNF  Dispo: The patient is from: Home              Anticipated d/c is to: SNF               Patient currently is medically stable to d/c.   Difficult to place patient No     Infusions:   magnesium sulfate bolus IVPB      Scheduled Meds:  (feeding supplement) PROSource Plus  30 mL Oral BID BM   alfuzosin  10 mg Oral Q breakfast   allopurinol  100 mg Oral Daily   vitamin C  1,000 mg Oral Daily   aspirin EC  81 mg Oral Daily   atorvastatin  80 mg Oral Daily   dapsone  100 mg Oral Daily   docusate sodium  100 mg Oral Daily   dolutegravir  50 mg Oral Daily   doravirine  100 mg Oral Daily    empagliflozin  10 mg Oral Daily   escitalopram  20 mg Oral Daily   finasteride  5 mg Oral Daily   gabapentin  100 mg Oral TID   icosapent Ethyl  2 g Oral BID   lamiVUDine  300 mg Oral Daily   multivitamin with minerals  1 tablet Oral Q24H   oxybutynin  2.5 mg Oral BID   oxyCODONE  10 mg Oral QID   pantoprazole  40 mg Oral  Daily   senna  2 tablet Oral QHS   zinc sulfate  220 mg Oral Daily    Antimicrobials: Anti-infectives (From admission, onward)    Start     Dose/Rate Route Frequency Ordered Stop   12/02/20 1230  ceFAZolin (ANCEF) IVPB 2g/100 mL premix        2 g 200 mL/hr over 30 Minutes Intravenous Every 8 hours 12/02/20 1134 12/02/20 2245   12/02/20 0600  ceFAZolin (ANCEF) IVPB 3g/100 mL premix        3 g 200 mL/hr over 30 Minutes Intravenous On call to O.R. 12/01/20 1909 12/02/20 0950   12/01/20 0600  ceFAZolin (ANCEF) IVPB 2g/100 mL premix  Status:  Discontinued        2 g 200 mL/hr over 30 Minutes Intravenous On call to O.R. 11/30/20 1817 11/30/20 1819   12/01/20 0600  ceFAZolin (ANCEF) IVPB 3g/100 mL premix        3 g 200 mL/hr over 30 Minutes Intravenous On call to O.R. 11/30/20 1820 12/02/20 0559   11/18/20 0100  ceFAZolin (ANCEF) IVPB 2g/100 mL premix        2 g 200 mL/hr over 30 Minutes Intravenous Every 8 hours 11/17/20 2045 11/18/20 1038   11/17/20 0600  ceFAZolin (ANCEF) IVPB 3g/100 mL premix        3 g 200 mL/hr over 30 Minutes Intravenous On call to O.R. 11/16/20 1941 11/17/20 1901   11/11/20 1200  vancomycin (VANCOREADY) IVPB 500 mg/100 mL  Status:  Discontinued        500 mg 100 mL/hr over 60 Minutes Intravenous Every T-Th-Sa (Hemodialysis) 11/09/20 1414 11/11/20 1328   11/10/20 0000  Dolutegravir-lamiVUDine (DOVATO) 50-300 MG TABS        1 tablet Oral Daily 11/10/20 1048     11/10/20 0000  doravirine (PIFELTRO) 100 MG TABS tablet        100 mg Oral Daily 11/10/20 1048     11/08/20 1000  dolutegravir (TIVICAY) tablet 50 mg        50 mg Oral Daily  11/07/20 1451     11/08/20 1000  lamiVUDine (EPIVIR) tablet 300 mg        300 mg Oral Daily 11/07/20 1451     11/08/20 1000  doravirine (PIFELTRO) tablet 100 mg        100 mg Oral Daily 11/07/20 1451     11/02/20 1130  ceFAZolin (ANCEF) IVPB 2g/100 mL premix        2 g 200 mL/hr over 30 Minutes Intravenous To Radiology 11/02/20 1042 11/02/20 1704   10/27/20 1245  ceFAZolin (ANCEF) IVPB 3g/100 mL premix        3 g 200 mL/hr over 30 Minutes Intravenous On call to O.R. 10/27/20 0755 10/27/20 1608   10/20/20 1415  linezolid (ZYVOX) tablet 600 mg  Status:  Discontinued        600 mg Oral Every 12 hours 10/20/20 1326 10/28/20 1115   10/19/20 1445  metroNIDAZOLE (FLAGYL) tablet 500 mg  Status:  Discontinued        500 mg Oral Every 12 hours 10/19/20 1347 10/28/20 1115   10/18/20 1600  ceFEPIme (MAXIPIME) 2 g in sodium chloride 0.9 % 100 mL IVPB  Status:  Discontinued        2 g 200 mL/hr over 30 Minutes Intravenous Every 12 hours 10/18/20 0414 10/28/20 1112   10/18/20 1000  dapsone tablet 100 mg        100 mg Oral Daily  10/18/20 0425     10/18/20 1000  bictegravir-emtricitabine-tenofovir AF (BIKTARVY) 50-200-25 MG per tablet 1 tablet  Status:  Discontinued        1 tablet Oral Daily 10/18/20 0610 11/07/20 1451   10/18/20 0415  metroNIDAZOLE (FLAGYL) IVPB 500 mg  Status:  Discontinued        500 mg 100 mL/hr over 60 Minutes Intravenous Every 8 hours 10/18/20 0404 10/19/20 1347   10/18/20 0415  ceFEPIme (MAXIPIME) 2 g in sodium chloride 0.9 % 100 mL IVPB        2 g 200 mL/hr over 30 Minutes Intravenous  Once 10/18/20 0414 10/18/20 0500   10/18/20 0345  vancomycin (VANCOREADY) IVPB 2000 mg/400 mL        2,000 mg 200 mL/hr over 120 Minutes Intravenous  Once 10/18/20 0335 10/18/20 0608       PRN meds: acetaminophen, alum & mag hydroxide-simeth, bisacodyl, diphenoxylate-atropine, guaiFENesin-dextromethorphan, hydrALAZINE, HYDROmorphone (DILAUDID) injection, labetalol, magnesium sulfate bolus  IVPB, metoprolol tartrate, ondansetron, oxyCODONE, oxyCODONE, phenol, polyethylene glycol, potassium chloride   Objective: Vitals:   12/09/20 0811 12/09/20 1510  BP: 106/69 111/76  Pulse: 77 75  Resp: 17 16  Temp: 98.1 F (36.7 C) 97.7 F (36.5 C)  SpO2: 98% 98%    Intake/Output Summary (Last 24 hours) at 12/09/2020 1829 Last data filed at 12/09/2020 1614 Gross per 24 hour  Intake 240 ml  Output 1110 ml  Net -870 ml    Filed Weights   11/20/20 0618 11/23/20 0618 11/23/20 2144  Weight: 117.3 kg 121.4 kg 121.4 kg   Weight change:  Body mass index is 38.4 kg/m.   Physical Exam: General exam: Patient is awake and alert.  Patient is not in any distress.  S HEENT: Patient has pallor.  No jaundice  Lungs: Clear to auscultation bilaterally CVS: Regular rate and rhythm, no murmur GI/Abd soft, nontender, nondistended, bowel sound present CNS: Alert, awake, oriented x3 Psychiatry: Mood appropriate Extremities: Prior right BKA status. New left BKA with bandage around lower leg.  Not bleeding at this time.  Data Review: I have personally reviewed the laboratory data and studies available.  Recent Labs  Lab 12/05/20 0314 12/06/20 0122 12/07/20 0201 12/08/20 0152 12/09/20 0122  WBC 12.8* 12.6* 11.7* 9.6 11.1*  NEUTROABS 9.7* 10.1* 8.9* 6.9 8.2*  HGB 8.1* 8.6* 7.9* 7.8* 8.6*  HCT 24.9* 25.9* 24.6* 24.5* 26.6*  MCV 91.2 91.5 91.4 92.5 92.7  PLT 356 378 410* 410* 420*    Recent Labs  Lab 12/04/20 2136 12/05/20 0314 12/06/20 0122 12/07/20 0201 12/08/20 0152 12/09/20 0122  NA 132*  --  134* 133* 135 135  K 4.5  --  4.5 4.6 4.6 5.1  CL 103  --  104 101 102 100  CO2 21*  --  '22 23 25 26  '$ GLUCOSE 122*  --  117* 135* 134* 142*  BUN 27*  --  25* 25* 23* 25*  CREATININE 2.15*  --  2.03* 2.15* 2.01* 2.09*  CALCIUM 8.4*  --  8.7* 8.6* 9.1 9.1  MG  --  2.0 1.9 2.2 2.0 2.1  PHOS 4.2  --  4.0 4.3 4.5 4.8*     F/u labs ordered Unresulted Labs (From admission, onward)      Start     Ordered   12/07/20 0500  T-helper cells (CD4) count (not at [facility])  Tomorrow morning,   R       Question:  Specimen collection method  Answer:  Lab=Lab collect  12/06/20 1027   12/03/20 0500  CBC  Daily,   R     Question:  Specimen collection method  Answer:  Lab=Lab collect   12/02/20 1134   11/21/20 0500  CBC with Differential/Platelet  Daily,   R     Question:  Specimen collection method  Answer:  Lab=Lab collect   11/20/20 1010   11/21/20 0500  Magnesium  Daily,   R     Question:  Specimen collection method  Answer:  Lab=Lab collect   11/20/20 1010   11/02/20 0500  Renal function panel  Daily,   R     Question:  Specimen collection method  Answer:  Lab=Lab collect   11/01/20 1153            Time spent: 25 minutes.  Signed, Bonnell Public, MD [facility] 12/09/2020

## 2020-12-09 NOTE — Plan of Care (Signed)
  Problem: Clinical Measurements: Goal: Ability to maintain clinical measurements within normal limits will improve Outcome: Progressing   Problem: Activity: Goal: Risk for activity intolerance will decrease Outcome: Progressing   Problem: Nutrition: Goal: Adequate nutrition will be maintained Outcome: Progressing   Problem: Elimination: Goal: Will not experience complications related to bowel motility Outcome: Progressing   Problem: Pain Managment: Goal: General experience of comfort will improve Outcome: Progressing   Problem: Safety: Goal: Ability to remain free from injury will improve Outcome: Progressing   Problem: Skin Integrity: Goal: Risk for impaired skin integrity will decrease Outcome: Progressing   

## 2020-12-09 NOTE — Progress Notes (Signed)
Patient continues to refuse other pain medication. Patient will only take his Dilaudid every 6 hour as per order.

## 2020-12-09 NOTE — NC FL2 (Signed)
[place] LEVEL OF CARE SCREENING TOOL     IDENTIFICATION  Patient Name: Keith [name] Birthdate: [birth year] Sex: male Admission Date (Current Location): 10/17/2020  [facility] and [place] Number:      Facility and Address:  [facility], [place] [address] [place], [place] [address]      Provider Number: [id number]  Attending Physician Name and Address:  Bonnell Public, MD  Relative Name and Phone Number:  [name], mother,    [phone]    Current Level of Care: Hospital Recommended Level of Care: [place] Prior Approval Number:    Date Approved/Denied:   PASRR Number: [id number]  Discharge Plan: SNF    Current Diagnoses: Patient Active Problem List   Diagnosis Date Noted   Dehiscence of amputation stump ([place])    Hx of BKA, left ([place])    Cellulitis of left foot    Acute osteomyelitis of left calcaneus ([place])    Ulcer of heel due to diabetes mellitus ([place])    Normochromic normocytic anemia    Cellulitis 10/18/2020   Atypical chest pain [id number]   Acute metabolic encephalopathy [id number]   Hyperglycemia due to diabetes mellitus ([place]) 08/29/2020   Hyperglycemia 08/29/2020   Acute cerebrovascular accident (CVA) due to ischemia ([place]) 04/28/2020   Type 2 diabetes mellitus with hyperlipidemia ([place])    PAD (peripheral artery disease) ([place])    Hypoglycemia 04/25/2020   Acute ST elevation myocardial infarction (STEMI) due to occlusion of circumflex coronary artery ([place]) 03/22/2020   Uncontrolled type 2 diabetes mellitus with hyperglycemia ([place]) 01/14/2019   Elevated glucose 01/14/2019   Hx of BKA, right ([place]) 01/14/2019   Pressure injury of skin 11/11/2018   STEMI (ST elevation myocardial infarction) ([place]) 11/10/2018   CKD (chronic kidney disease) stage 4, GFR 15-29 ml/min ([place]) 11/10/2018   Amputation stump infection ([place]) 10/28/2018   Type II diabetes mellitus with renal manifestations ([place]) 08/07/2018    Uncontrolled type 2 diabetes mellitus with hyperosmolar nonketotic hyperglycemia ([place]) 08/07/2018   Non-pressure chronic ulcer of right calf limited to breakdown of skin ([place]) 07/06/2018   Type 2 diabetes mellitus without complication, without long-term current use of insulin ([place]) 06/15/2018   Chronic low back pain without sciatica 06/15/2018   Idiopathic chronic venous hypertension of left lower extremity with ulcer and inflammation (HCC)    Venous stasis ulcer of left calf limited to breakdown of skin without varicose veins ([place]) 04/23/2018   Acquired absence of right leg below knee ([place]) 06/13/2017   Acute kidney injury superimposed on CKD ([place]) 03/15/2017   Diabetic polyneuropathy associated with type 2 diabetes mellitus ([place]) 01/23/2017   AKI (acute kidney injury) ([place]) 09/28/2016   Nausea vomiting and diarrhea 09/28/2016   Onychomycosis of multiple toenails with type 2 diabetes mellitus ([place]) 08/29/2015   MRSA carrier 04/20/2014   Penile wart 02/22/2014   HIV disease ([place])    Insulin-requiring or dependent type II diabetes mellitus ([place]) 02/04/2014   Dental anomaly 11/20/2012   Arthritis of right knee 02/23/2012   Hyperlipidemia 11/10/2011   Hyponatremia 11/10/2011   Chronic pain 08/07/2011   Meralgia paraesthetica 04/23/2011   ERECTILE DYSFUNCTION 08/22/2008   Elevated blood pressure reading with diagnosis of hypertension 05/19/2008    Orientation RESPIRATION BLADDER Height & Weight     Self, Time, Situation, Place  Normal Continent Weight: 267 lb 10.2 oz (121.4 kg) Height:  '5\' 10"'$  (177.8 cm)  BEHAVIORAL SYMPTOMS/MOOD NEUROLOGICAL BOWEL NUTRITION STATUS  Continent Diet (Please see DC Summary)  AMBULATORY STATUS COMMUNICATION OF NEEDS Skin   Limited Assist Verbally Other (Comment) (11/17/20 left leg closed incision, 12/02/20 left leg closed incision, 12/02/20 negative pressure wound left leg)                       Personal Care Assistance Level of Assistance   Bathing, Feeding, Dressing Bathing Assistance: Limited assistance Feeding assistance: Independent Dressing Assistance: Limited assistance     Functional Limitations Info  Sight, Speech, Hearing Sight Info: Adequate Hearing Info: Adequate Speech Info: Adequate    SPECIAL CARE FACTORS FREQUENCY  PT (By licensed PT), OT (By licensed OT)     PT Frequency: 2x/week OT Frequency: 2x/week            Contractures Contractures Info: Not present    Additional Factors Info  Code Status, Allergies, Insulin Sliding Scale Code Status Info: Full Allergies Info: Elemental Sulfur, Sulfa Antibiotics   Insulin Sliding Scale Info: See DC Summary       Current Medications (12/09/2020):  This is the current hospital active medication list Current Facility-Administered Medications  Medication Dose Route Frequency Provider Last Rate Last Admin   (feeding supplement) PROSource Plus liquid 30 mL  30 mL Oral BID BM Dahal, Binaya, MD       acetaminophen (TYLENOL) tablet 325-650 mg  325-650 mg Oral Q6H PRN [name], MD       alfuzosin (UROXATRAL) 24 hr tablet 10 mg  10 mg Oral Q breakfast [name], MD   10 mg at 12/09/20 [id number]   allopurinol (ZYLOPRIM) tablet 100 mg  100 mg Oral Daily [name], MD   100 mg at 12/09/20 1013   alum & mag hydroxide-simeth (MAALOX/MYLANTA) 200-200-20 MG/5ML suspension 15-30 mL  15-30 mL Oral Q2H PRN [name], MD       ascorbic acid (VITAMIN C) tablet 1,000 mg  1,000 mg Oral Daily [name], MD   1,000 mg at 12/09/20 1013   aspirin EC tablet 81 mg  81 mg Oral Daily [name], MD   81 mg at 12/09/20 1013   atorvastatin (LIPITOR) tablet 80 mg  80 mg Oral Daily [name], MD   80 mg at 12/09/20 1017   bisacodyl (DULCOLAX) EC tablet 5 mg  5 mg Oral Daily PRN [name], MD       dapsone tablet 100 mg  100 mg Oral Daily [name], MD   100 mg at 12/09/20 1015   diphenoxylate-atropine (LOMOTIL) 2.5-0.025 MG/5ML liquid 5 mL  5 mL  Oral QID PRN [name], MD       docusate sodium (COLACE) capsule 100 mg  100 mg Oral Daily [name], MD   100 mg at 12/08/20 1012   dolutegravir (TIVICAY) tablet 50 mg  50 mg Oral Daily [name], MD   50 mg at 12/09/20 1016   doravirine (PIFELTRO) tablet 100 mg  100 mg Oral Daily [name], MD   100 mg at 12/09/20 1015   empagliflozin (JARDIANCE) tablet 10 mg  10 mg Oral Daily [name], MD   10 mg at 12/09/20 1015   escitalopram (LEXAPRO) tablet 20 mg  20 mg Oral Daily [name], MD   20 mg at 12/09/20 1013   finasteride (PROSCAR) tablet 5 mg  5 mg Oral Daily [name], MD   5 mg at 12/09/20  1012   gabapentin (NEURONTIN) capsule 100 mg  100 mg Oral TID [name], MD   100 mg at 12/09/20 1012   guaiFENesin-dextromethorphan (ROBITUSSIN DM) 100-10 MG/5ML syrup 15 mL  15 mL Oral Q4H PRN [name], MD       hydrALAZINE (APRESOLINE) injection 5 mg  5 mg Intravenous Q20 Min PRN [name], MD       HYDROmorphone (DILAUDID) injection 0.5 mg  0.5 mg Intravenous Q6H PRN Dahal, Marlowe Aschoff, MD   0.5 mg at 12/09/20 1028   icosapent Ethyl (VASCEPA) 1 g capsule 2 g  2 g Oral BID [name], MD   2 g at 12/09/20 1016   labetalol (NORMODYNE) injection 10 mg  10 mg Intravenous Q10 min PRN [name], MD       lamiVUDine (EPIVIR) tablet 300 mg  300 mg Oral Daily [name], MD   300 mg at 12/09/20 1012   magnesium sulfate IVPB 2 g 50 mL  2 g Intravenous Daily PRN [name], MD       metoprolol tartrate (LOPRESSOR) injection 2-5 mg  2-5 mg Intravenous Q2H PRN [name], MD       multivitamin with minerals tablet 1 tablet  1 tablet Oral Q24H Dahal, Marlowe Aschoff, MD   1 tablet at 12/08/20 1809   ondansetron (ZOFRAN) injection 4 mg  4 mg Intravenous Q6H PRN [name], MD   4 mg at 11/18/20 1139   oxybutynin (DITROPAN) tablet 2.5 mg  2.5 mg Oral BID [name], MD   2.5 mg at 12/09/20 1018   oxyCODONE (Oxy IR/ROXICODONE) immediate release tablet 10  mg  10 mg Oral QID [name], MD   10 mg at 12/08/20 1810   oxyCODONE (Oxy IR/ROXICODONE) immediate release tablet 10-15 mg  10-15 mg Oral Q4H PRN [name], MD   15 mg at 12/03/20 0417   oxyCODONE (Oxy IR/ROXICODONE) immediate release tablet 5-10 mg  5-10 mg Oral Q4H PRN [name], MD       pantoprazole (PROTONIX) EC tablet 40 mg  40 mg Oral Daily [name], MD   40 mg at 12/09/20 1015   phenol (CHLORASEPTIC) mouth spray 1 spray  1 spray Mouth/Throat PRN [name], MD       polyethylene glycol (MIRALAX / GLYCOLAX) packet 17 g  17 g Oral Daily PRN [name], MD       potassium chloride SA (KLOR-CON) CR tablet 20-40 mEq  20-40 mEq Oral Daily PRN [name], MD       senna (SENOKOT) tablet 17.2 mg  2 tablet Oral QHS [name], MD   17.2 mg at 12/08/20 2133   zinc sulfate capsule 220 mg  220 mg Oral Daily [name], MD   220 mg at 12/09/20 1013     Discharge Medications: Please see discharge summary for a list of discharge medications.  Relevant Imaging Results:  Relevant Lab Results:   Additional Information SS#: [id number]. JANSSEN COVID-19 VACCINE 11/12/2019 only  Gilmar Bua B Otniel Hoe, LCSWA

## 2020-12-10 DIAGNOSIS — N179 Acute kidney failure, unspecified: Secondary | ICD-10-CM | POA: Diagnosis not present

## 2020-12-10 LAB — CBC WITH DIFFERENTIAL/PLATELET
Abs Immature Granulocytes: 0.07 10*3/uL (ref 0.00–0.07)
Basophils Absolute: 0 10*3/uL (ref 0.0–0.1)
Basophils Relative: 0 %
Eosinophils Absolute: 0.3 10*3/uL (ref 0.0–0.5)
Eosinophils Relative: 4 %
HCT: 27.4 % — ABNORMAL LOW (ref 39.0–52.0)
Hemoglobin: 8.6 g/dL — ABNORMAL LOW (ref 13.0–17.0)
Immature Granulocytes: 1 %
Lymphocytes Relative: 16 %
Lymphs Abs: 1.6 10*3/uL (ref 0.7–4.0)
MCH: 29.5 pg (ref 26.0–34.0)
MCHC: 31.4 g/dL (ref 30.0–36.0)
MCV: 93.8 fL (ref 80.0–100.0)
Monocytes Absolute: 0.6 10*3/uL (ref 0.1–1.0)
Monocytes Relative: 6 %
Neutro Abs: 7.1 10*3/uL (ref 1.7–7.7)
Neutrophils Relative %: 73 %
Platelets: 420 10*3/uL — ABNORMAL HIGH (ref 150–400)
RBC: 2.92 MIL/uL — ABNORMAL LOW (ref 4.22–5.81)
RDW: 14.4 % (ref 11.5–15.5)
WBC: 9.8 10*3/uL (ref 4.0–10.5)
nRBC: 0 % (ref 0.0–0.2)

## 2020-12-10 LAB — RENAL FUNCTION PANEL
Albumin: 2.3 g/dL — ABNORMAL LOW (ref 3.5–5.0)
Anion gap: 10 (ref 5–15)
BUN: 29 mg/dL — ABNORMAL HIGH (ref 6–20)
CO2: 24 mmol/L (ref 22–32)
Calcium: 8.9 mg/dL (ref 8.9–10.3)
Chloride: 101 mmol/L (ref 98–111)
Creatinine, Ser: 2.02 mg/dL — ABNORMAL HIGH (ref 0.61–1.24)
GFR, Estimated: 38 mL/min — ABNORMAL LOW (ref >60)
Glucose, Bld: 164 mg/dL — ABNORMAL HIGH (ref 70–99)
Phosphorus: 5 mg/dL — ABNORMAL HIGH (ref 2.5–4.6)
Potassium: 4.6 mmol/L (ref 3.5–5.1)
Sodium: 135 mmol/L (ref 135–145)

## 2020-12-10 LAB — GLUCOSE, CAPILLARY
Glucose-Capillary: 200 mg/dL — ABNORMAL HIGH (ref 70–99)
Glucose-Capillary: 141 mg/dL — ABNORMAL HIGH (ref 70–99)
Glucose-Capillary: 139 mg/dL — ABNORMAL HIGH (ref 70–99)

## 2020-12-10 LAB — MAGNESIUM: Magnesium: 2.2 mg/dL (ref 1.7–2.4)

## 2020-12-10 NOTE — Progress Notes (Signed)
[name], male   DOB: [birth year], 55 y.o.   MRN: [name] POD #6 revision left BKA, VAC intact and no drainage. Awaiting SNF placement. F/U with [name] next week TH or F.

## 2020-12-10 NOTE — Plan of Care (Signed)
  Problem: Skin Integrity: Goal: Skin integrity will improve Outcome: Progressing   Problem: Clinical Measurements: Goal: Ability to maintain clinical measurements within normal limits will improve Outcome: Progressing   Problem: Activity: Goal: Risk for activity intolerance will decrease Outcome: Progressing   Problem: Nutrition: Goal: Adequate nutrition will be maintained Outcome: Progressing   Problem: Elimination: Goal: Will not experience complications related to bowel motility Outcome: Progressing   Problem: Pain Managment: Goal: General experience of comfort will improve Outcome: Progressing   Problem: Safety: Goal: Ability to remain free from injury will improve Outcome: Progressing   Problem: Skin Integrity: Goal: Risk for impaired skin integrity will decrease Outcome: Progressing

## 2020-12-10 NOTE — Progress Notes (Signed)
PROGRESS NOTE  Keith [name]  DOB: 05-Jan-1966  [name], NP [id number]  DOA: 10/17/2020  LOS: 26 days  Hospital Day: 47   Chief Complaint  Patient presents with   Foot Ulcer    Brief narrative: Keith [name] is a 55 y.o. male with PMH significant for DM2, HTN, HLD, CAD, CKD 3B, HIV, right BKA status.  Patient was sent to the ED on 7/12 by his orthopedic surgeon due to concern of left heel ulcer that failed outpatient therapy.   Subsequently, MRI confirmed osteomyelitis.  He underwent left transtibial amputation by [name].  During the hospitalization, he had worsening renal function.  Nephrology was consulted.  He had temporary dialysis catheter placed and dialysis was started.  Later, he developed hematuria and required fulguration and CBI along with blood transfusions.  Renal function improved; dialysis was stopped and nephrology signed off.  Subsequently, his wound dehisced with bleeding requiring blood transfusion.   See below for details.  12/09/2020: Patient seen.  No new changes.  Awaiting skilled nursing facility placement. 12/10/2020: Patient seen.  Patient remained stable.  Awaiting disposition.  Subjective: No new complaints. No chest pain. No shortness of breath.    Assessment/Plan: Acute kidney injury with metabolic acidosis  -Patient presented with worsening renal functions.  He was started on dialysis, but his renal function recovered and dialysis was subsequently stopped.  His AKI was felt to be due to ATN.  Renal ultrasound was negative for hydronephrosis. -Currently creatinine staying above 2.  Stable.  Currently remains off IV fluid. 12/09/2020: Patient actually has chronic kidney disease at baseline.  Stable. Recent Labs    12/01/20 0232 12/02/20 0040 12/03/20 0148 12/04/20 0305 12/04/20 2136 12/06/20 0122 12/07/20 0201 12/08/20 0152 12/09/20 0122 12/10/20 0112  BUN 27* 24* 25* 29* 27* 25* 25* 23* 25* 29*  CREATININE 2.03* 2.04* 2.24*  2.49* 2.15* 2.03* 2.15* 2.01* 2.09* 2.02*  CO2 22 23 20* 21* 21* '22 23 25 26 24   '$ Left heel osteomyelitis Recurrent wound dehiscence and bleeding -Patient has a previous right BKA status. -7/22, patient had left foot transtibial amputation which failed leading to wound dehiscence and bleeding.  -8/12, patient had revision amputation below-knee.  Patient continued to have poor wound healing and bleeding.   -8/27, patient had revision left BKA. -Currently pain controlled with Dilaudid 0.5 mg every 6 hours and oxycodone as needed.  Continue to taper down IV Dilaudid. 12/09/2020: Patient is status post revision of left BKA.  Hematuria Acute urinary retention -11/08/2020 -Foley catheter placement with drainage of clots.  Urology was consulted  -8/4 - underwent cystoscopy with fulguration and started on CBI and Proscar.  Subsequently CBI was discontinued. -8/15-Foley catheter discontinued. -8/22-patient had another episode of hematuria and clots. Urology recommended Proscar, Uroxatrol and IV hydration. Hematuria has now improved.   Anemia due to acute blood loss from bladder and left stump -Hemoglobin at 7.8 today.  13 units of PRBC transfused so far this admission.  -Continue monitor hemoglobin. 12/09/2020: Anemia is likely multifactorial.  Patient also has chronic kidney disease, possibly stage IIIb. Recent Labs    04/26/20 1228 04/27/20 0802 12/06/20 0122 12/07/20 0201 12/08/20 0152 12/09/20 0122 12/10/20 0112  HGB 10.7*   < > 8.6* 7.9* 7.8* 8.6* 8.6*  MCV 84.5   < > 91.5 91.4 92.5 92.7 93.8  VITAMINB12 451  --   --   --   --   --   --   FOLATE 8.7  --   --   --   --   --   --  FERRITIN 347*  --   --   --   --   --   --   TIBC 276  --   --   --   --   --   --   IRON 71  --   --   --   --   --   --   RETICCTPCT 1.8  --   --   --   --   --   --    < > = values in this interval not displayed.    Uncontrolled type 2 diabetes mellitus Hyperglycemia -A1c was 13.9 on May 2022.  Repeat  A1c on 8/24 was at 5.9.  Wonder if it is truly low because of diet control in the hospital or spuriously low due to frequent blood loss and multiple transfusions.   -Currently on Jardiance 10 mg daily and sliding scale insulin.  Blood sugar is consistently under 150.  Okay to stop fingerstick blood sugar check.  CAD status post stenting in 03/2020 -Continue aspirin.  Because of recurrent bleeding, Brilinta was discontinued by cardiology on 11/19/2020.   -Once bleeding stabilizes, will plan to resume Brilinta.    HLD  -continue Vascepa and Lipitor  HIV -Continue current regimen of antiretrovirals and dapsone as per ID.   -Outpatient follow-up with ID   Mobility: PT eval obtained.  SNF recommended Code Status:   Code Status: Full Code  Nutritional status: Body mass index is 38.4 kg/m. Nutrition Problem: Increased nutrient needs Etiology: post-op healing Signs/Symptoms: estimated needs Diet:  Diet Order             Diet Carb Modified Fluid consistency: Thin; Room service appropriate? Yes  Diet effective now                  DVT prophylaxis: Not on heparin products because of frequent bleeding.  Bilateral BKA status, unable to use SCDs SCD's Start: 12/02/20 1135 Antimicrobials: None Fluid: Not on IV fluid Consultants: Orthopedics Family Communication: None at bedside  Status is: Inpatient  Remains inpatient appropriate because: Pending SNF  Dispo: The patient is from: Home              Anticipated d/c is to: SNF               Patient currently is medically stable to d/c.   Difficult to place patient No     Infusions:   magnesium sulfate bolus IVPB      Scheduled Meds:  (feeding supplement) PROSource Plus  30 mL Oral BID BM   alfuzosin  10 mg Oral Q breakfast   allopurinol  100 mg Oral Daily   vitamin C  1,000 mg Oral Daily   aspirin EC  81 mg Oral Daily   atorvastatin  80 mg Oral Daily   dapsone  100 mg Oral Daily   docusate sodium  100 mg Oral Daily    dolutegravir  50 mg Oral Daily   doravirine  100 mg Oral Daily   empagliflozin  10 mg Oral Daily   escitalopram  20 mg Oral Daily   finasteride  5 mg Oral Daily   gabapentin  100 mg Oral TID   icosapent Ethyl  2 g Oral BID   lamiVUDine  300 mg Oral Daily   multivitamin with minerals  1 tablet Oral Q24H   oxybutynin  2.5 mg Oral BID   oxyCODONE  10 mg Oral QID   pantoprazole  40 mg Oral  Daily   senna  2 tablet Oral QHS   zinc sulfate  220 mg Oral Daily    Antimicrobials: Anti-infectives (From admission, onward)    Start     Dose/Rate Route Frequency Ordered Stop   12/02/20 1230  ceFAZolin (ANCEF) IVPB 2g/100 mL premix        2 g 200 mL/hr over 30 Minutes Intravenous Every 8 hours 12/02/20 1134 12/02/20 2245   12/02/20 0600  ceFAZolin (ANCEF) IVPB 3g/100 mL premix        3 g 200 mL/hr over 30 Minutes Intravenous On call to O.R. 12/01/20 1909 12/02/20 0950   12/01/20 0600  ceFAZolin (ANCEF) IVPB 2g/100 mL premix  Status:  Discontinued        2 g 200 mL/hr over 30 Minutes Intravenous On call to O.R. 11/30/20 1817 11/30/20 1819   12/01/20 0600  ceFAZolin (ANCEF) IVPB 3g/100 mL premix        3 g 200 mL/hr over 30 Minutes Intravenous On call to O.R. 11/30/20 1820 12/02/20 0559   11/18/20 0100  ceFAZolin (ANCEF) IVPB 2g/100 mL premix        2 g 200 mL/hr over 30 Minutes Intravenous Every 8 hours 11/17/20 2045 11/18/20 1038   11/17/20 0600  ceFAZolin (ANCEF) IVPB 3g/100 mL premix        3 g 200 mL/hr over 30 Minutes Intravenous On call to O.R. 11/16/20 1941 11/17/20 1901   11/11/20 1200  vancomycin (VANCOREADY) IVPB 500 mg/100 mL  Status:  Discontinued        500 mg 100 mL/hr over 60 Minutes Intravenous Every T-Th-Sa (Hemodialysis) 11/09/20 1414 11/11/20 1328   11/10/20 0000  Dolutegravir-lamiVUDine (DOVATO) 50-300 MG TABS        1 tablet Oral Daily 11/10/20 1048     11/10/20 0000  doravirine (PIFELTRO) 100 MG TABS tablet        100 mg Oral Daily 11/10/20 1048     11/08/20 1000   dolutegravir (TIVICAY) tablet 50 mg        50 mg Oral Daily 11/07/20 1451     11/08/20 1000  lamiVUDine (EPIVIR) tablet 300 mg        300 mg Oral Daily 11/07/20 1451     11/08/20 1000  doravirine (PIFELTRO) tablet 100 mg        100 mg Oral Daily 11/07/20 1451     11/02/20 1130  ceFAZolin (ANCEF) IVPB 2g/100 mL premix        2 g 200 mL/hr over 30 Minutes Intravenous To Radiology 11/02/20 1042 11/02/20 1704   10/27/20 1245  ceFAZolin (ANCEF) IVPB 3g/100 mL premix        3 g 200 mL/hr over 30 Minutes Intravenous On call to O.R. 10/27/20 0755 10/27/20 1608   10/20/20 1415  linezolid (ZYVOX) tablet 600 mg  Status:  Discontinued        600 mg Oral Every 12 hours 10/20/20 1326 10/28/20 1115   10/19/20 1445  metroNIDAZOLE (FLAGYL) tablet 500 mg  Status:  Discontinued        500 mg Oral Every 12 hours 10/19/20 1347 10/28/20 1115   10/18/20 1600  ceFEPIme (MAXIPIME) 2 g in sodium chloride 0.9 % 100 mL IVPB  Status:  Discontinued        2 g 200 mL/hr over 30 Minutes Intravenous Every 12 hours 10/18/20 0414 10/28/20 1112   10/18/20 1000  dapsone tablet 100 mg        100 mg Oral Daily  10/18/20 0425     10/18/20 1000  bictegravir-emtricitabine-tenofovir AF (BIKTARVY) 50-200-25 MG per tablet 1 tablet  Status:  Discontinued        1 tablet Oral Daily 10/18/20 0610 11/07/20 1451   10/18/20 0415  metroNIDAZOLE (FLAGYL) IVPB 500 mg  Status:  Discontinued        500 mg 100 mL/hr over 60 Minutes Intravenous Every 8 hours 10/18/20 0404 10/19/20 1347   10/18/20 0415  ceFEPIme (MAXIPIME) 2 g in sodium chloride 0.9 % 100 mL IVPB        2 g 200 mL/hr over 30 Minutes Intravenous  Once 10/18/20 0414 10/18/20 0500   10/18/20 0345  vancomycin (VANCOREADY) IVPB 2000 mg/400 mL        2,000 mg 200 mL/hr over 120 Minutes Intravenous  Once 10/18/20 0335 10/18/20 0608       PRN meds: acetaminophen, alum & mag hydroxide-simeth, bisacodyl, diphenoxylate-atropine, guaiFENesin-dextromethorphan, hydrALAZINE,  HYDROmorphone (DILAUDID) injection, labetalol, magnesium sulfate bolus IVPB, metoprolol tartrate, ondansetron, oxyCODONE, oxyCODONE, phenol, polyethylene glycol, potassium chloride   Objective: Vitals:   12/10/20 0900 12/10/20 1330  BP: 108/79 110/77  Pulse: 92 88  Resp: 18 17  Temp: 98.7 F (37.1 C) 98.8 F (37.1 C)  SpO2: 98% 95%    Intake/Output Summary (Last 24 hours) at 12/10/2020 1929 Last data filed at 12/10/2020 1828 Gross per 24 hour  Intake 640 ml  Output 1370 ml  Net -730 ml    Filed Weights   11/20/20 0618 11/23/20 0618 11/23/20 2144  Weight: 117.3 kg 121.4 kg 121.4 kg   Weight change:  Body mass index is 38.4 kg/m.   Physical Exam: General exam: Patient is awake and alert.  Patient is not in any distress.  S HEENT: Patient has pallor.  No jaundice  Lungs: Clear to auscultation bilaterally CVS: Regular rate and rhythm, no murmur GI/Abd soft, nontender, nondistended, bowel sound present CNS: Alert, awake, oriented x3 Psychiatry: Mood appropriate Extremities: Prior right BKA status. New left BKA with bandage around lower leg.  Not bleeding at this time.  Data Review: I have personally reviewed the laboratory data and studies available.  Recent Labs  Lab 12/06/20 0122 12/07/20 0201 12/08/20 0152 12/09/20 0122 12/10/20 0112  WBC 12.6* 11.7* 9.6 11.1* 9.8  NEUTROABS 10.1* 8.9* 6.9 8.2* 7.1  HGB 8.6* 7.9* 7.8* 8.6* 8.6*  HCT 25.9* 24.6* 24.5* 26.6* 27.4*  MCV 91.5 91.4 92.5 92.7 93.8  PLT 378 410* 410* 420* 420*    Recent Labs  Lab 12/06/20 0122 12/07/20 0201 12/08/20 0152 12/09/20 0122 12/10/20 0112  NA 134* 133* 135 135 135  K 4.5 4.6 4.6 5.1 4.6  CL 104 101 102 100 101  CO2 '22 23 25 26 24  '$ GLUCOSE 117* 135* 134* 142* 164*  BUN 25* 25* 23* 25* 29*  CREATININE 2.03* 2.15* 2.01* 2.09* 2.02*  CALCIUM 8.7* 8.6* 9.1 9.1 8.9  MG 1.9 2.2 2.0 2.1 2.2  PHOS 4.0 4.3 4.5 4.8* 5.0*     F/u labs ordered Unresulted Labs (From admission, onward)      Start     Ordered   12/07/20 0500  T-helper cells (CD4) count (not at [facility])  Tomorrow morning,   R       Question:  Specimen collection method  Answer:  Lab=Lab collect   12/06/20 1027   12/03/20 0500  CBC  Daily,   R     Question:  Specimen collection method  Answer:  Lab=Lab collect   12/02/20  1134   11/21/20 0500  CBC with Differential/Platelet  Daily,   R     Question:  Specimen collection method  Answer:  Lab=Lab collect   11/20/20 1010   11/21/20 0500  Magnesium  Daily,   R     Question:  Specimen collection method  Answer:  Lab=Lab collect   11/20/20 1010   11/02/20 0500  Renal function panel  Daily,   R     Question:  Specimen collection method  Answer:  Lab=Lab collect   11/01/20 1153            Time spent: 25 minutes.  Signed, Bonnell Public, MD [facility] 12/10/2020

## 2020-12-11 ENCOUNTER — Inpatient Hospital Stay (HOSPITAL_COMMUNITY): Payer: Medicare HMO

## 2020-12-11 DIAGNOSIS — N179 Acute kidney failure, unspecified: Secondary | ICD-10-CM | POA: Diagnosis not present

## 2020-12-11 LAB — CBC WITH DIFFERENTIAL/PLATELET
Abs Immature Granulocytes: 0.06 10*3/uL (ref 0.00–0.07)
Basophils Absolute: 0 10*3/uL (ref 0.0–0.1)
Basophils Relative: 0 %
Eosinophils Absolute: 0.4 10*3/uL (ref 0.0–0.5)
Eosinophils Relative: 4 %
HCT: 26.2 % — ABNORMAL LOW (ref 39.0–52.0)
Hemoglobin: 8.2 g/dL — ABNORMAL LOW (ref 13.0–17.0)
Immature Granulocytes: 1 %
Lymphocytes Relative: 17 %
Lymphs Abs: 1.8 10*3/uL (ref 0.7–4.0)
MCH: 29.2 pg (ref 26.0–34.0)
MCHC: 31.3 g/dL (ref 30.0–36.0)
MCV: 93.2 fL (ref 80.0–100.0)
Monocytes Absolute: 0.7 10*3/uL (ref 0.1–1.0)
Monocytes Relative: 7 %
Neutro Abs: 7.3 10*3/uL (ref 1.7–7.7)
Neutrophils Relative %: 71 %
Platelets: 404 10*3/uL — ABNORMAL HIGH (ref 150–400)
RBC: 2.81 MIL/uL — ABNORMAL LOW (ref 4.22–5.81)
RDW: 14.3 % (ref 11.5–15.5)
WBC: 10.3 10*3/uL (ref 4.0–10.5)
nRBC: 0 % (ref 0.0–0.2)

## 2020-12-11 LAB — RENAL FUNCTION PANEL
Albumin: 2.4 g/dL — ABNORMAL LOW (ref 3.5–5.0)
Anion gap: 8 (ref 5–15)
BUN: 29 mg/dL — ABNORMAL HIGH (ref 6–20)
CO2: 24 mmol/L (ref 22–32)
Calcium: 9.3 mg/dL (ref 8.9–10.3)
Chloride: 103 mmol/L (ref 98–111)
Creatinine, Ser: 2.04 mg/dL — ABNORMAL HIGH (ref 0.61–1.24)
GFR, Estimated: 38 mL/min — ABNORMAL LOW (ref >60)
Glucose, Bld: 129 mg/dL — ABNORMAL HIGH (ref 70–99)
Phosphorus: 4.8 mg/dL — ABNORMAL HIGH (ref 2.5–4.6)
Potassium: 4.8 mmol/L (ref 3.5–5.1)
Sodium: 135 mmol/L (ref 135–145)

## 2020-12-11 LAB — BASIC METABOLIC PANEL
Anion gap: 10 (ref 5–15)
BUN: 31 mg/dL — ABNORMAL HIGH (ref 6–20)
CO2: 24 mmol/L (ref 22–32)
Calcium: 9.1 mg/dL (ref 8.9–10.3)
Chloride: 103 mmol/L (ref 98–111)
Creatinine, Ser: 2.33 mg/dL — ABNORMAL HIGH (ref 0.61–1.24)
GFR, Estimated: 32 mL/min — ABNORMAL LOW (ref >60)
Glucose, Bld: 152 mg/dL — ABNORMAL HIGH (ref 70–99)
Potassium: 4.9 mmol/L (ref 3.5–5.1)
Sodium: 137 mmol/L (ref 135–145)

## 2020-12-11 LAB — MAGNESIUM: Magnesium: 2.2 mg/dL (ref 1.7–2.4)

## 2020-12-11 LAB — GLUCOSE, CAPILLARY
Glucose-Capillary: 126 mg/dL — ABNORMAL HIGH (ref 70–99)
Glucose-Capillary: 114 mg/dL — ABNORMAL HIGH (ref 70–99)
Glucose-Capillary: 140 mg/dL — ABNORMAL HIGH (ref 70–99)
Glucose-Capillary: 105 mg/dL — ABNORMAL HIGH (ref 70–99)

## 2020-12-11 MED ORDER — CHLORHEXIDINE GLUCONATE CLOTH 2 % EX PADS
6.0000 | MEDICATED_PAD | Freq: Every day | CUTANEOUS | Status: DC
  Administered 2020-12-11 – 2020-12-27 (×16): 6 via TOPICAL

## 2020-12-11 MED ORDER — SODIUM CHLORIDE 0.9 % IV BOLUS
500.0000 mL | Freq: Once | INTRAVENOUS | Status: AC
  Administered 2020-12-11: 500 mL via INTRAVENOUS

## 2020-12-11 MED ORDER — SODIUM CHLORIDE 0.9 % IR SOLN
3000.0000 mL | Status: DC
  Administered 2020-12-12: 3000 mL

## 2020-12-11 MED ORDER — SODIUM CHLORIDE 0.9 % IV SOLN
INTRAVENOUS | Status: DC
Start: 2020-12-11 — End: 2020-12-28

## 2020-12-11 NOTE — Progress Notes (Signed)
Bladder scan showing 548 ml, MD made aware.

## 2020-12-11 NOTE — Progress Notes (Signed)
Urology came in and inserted a 3 way foley catheter and started a bladder irrigation.

## 2020-12-11 NOTE — Progress Notes (Signed)
PROGRESS NOTE  Keith [name]  DOB: 05/03/1965  [name], NP [id number]  DOA: 10/17/2020  LOS: 51 days  Hospital Day: 73   Chief Complaint  Patient presents with   Foot Ulcer    Brief narrative: Keith [name] is a 55 y.o. male with PMH significant for DM2, HTN, HLD, CAD, CKD 3B, HIV, right BKA status.  Patient was sent to the ED on 7/12 by his orthopedic surgeon due to concern of left heel ulcer that failed outpatient therapy.   Subsequently, MRI confirmed osteomyelitis.  He underwent left transtibial amputation by [name].  During the hospitalization, he had worsening renal function.  Nephrology was consulted.  He had temporary dialysis catheter placed and dialysis was started.  Later, he developed hematuria and required fulguration and CBI along with blood transfusions.  Renal function improved; dialysis was stopped and nephrology signed off.  Subsequently, his wound dehisced with bleeding requiring blood transfusion.   See below for details. 12/11/2020: Patient seen.  Patient is awaiting skilled nursing facility placement.  Patient developed frank hematuria today.  Patient has had recurrent hematuria.  Urology saw patient was last month.  I have reconsulted urology team.  I discussed with Dr. Nicolette Bang.  Patient may need treatment with continuous bladder irrigation, possible cystoscopy eventually.  Renal ultrasound ordered.  Subjective: No new complaints. No chest pain. No shortness of breath.    Assessment/Plan: Acute kidney injury with metabolic acidosis  -Patient presented with worsening renal functions.  He was started on dialysis, but his renal function recovered and dialysis was subsequently stopped.  His AKI was felt to be due to ATN.  Renal ultrasound was negative for hydronephrosis. -Currently creatinine staying above 2.  Stable.  Currently remains off IV fluid. 12/09/2020: Patient actually has chronic kidney disease at baseline.  Stable. Recent Labs     12/03/20 0148 12/04/20 0305 12/04/20 2136 12/06/20 0122 12/07/20 0201 12/08/20 0152 12/09/20 0122 12/10/20 0112 12/11/20 0150 12/11/20 1354  BUN 25* 29* 27* 25* 25* 23* 25* 29* 29* 31*  CREATININE 2.24* 2.49* 2.15* 2.03* 2.15* 2.01* 2.09* 2.02* 2.04* 2.33*  CO2 20* 21* 21* '22 23 25 26 24 24 24   '$ Left heel osteomyelitis Recurrent wound dehiscence and bleeding -Patient has a previous right BKA status. -7/22, patient had left foot transtibial amputation which failed leading to wound dehiscence and bleeding.  -8/12, patient had revision amputation below-knee.  Patient continued to have poor wound healing and bleeding.   -8/27, patient had revision left BKA. -Currently pain controlled with Dilaudid 0.5 mg every 6 hours and oxycodone as needed.  Continue to taper down IV Dilaudid. 12/09/2020: Patient is status post revision of left BKA.  Pursue disposition.  Hematuria Acute urinary retention -11/08/2020 -Foley catheter placement with drainage of clots.  Urology was consulted  -8/4 - underwent cystoscopy with fulguration and started on CBI and Proscar.  Subsequently CBI was discontinued. -8/15-Foley catheter discontinued. -8/22-patient had another episode of hematuria and clots. Urology recommended Proscar, Uroxatrol and IV hydration. Hematuria has now improved. 12/31/2020: Recurrence of hematuria noted.  Urology was consulted   Anemia due to acute blood loss from bladder and left stump -Hemoglobin at 7.8 today.  13 units of PRBC transfused so far this admission.  -Continue monitor hemoglobin. 12/09/2020: Anemia is likely multifactorial.  Patient also has chronic kidney disease, possibly stage IIIb. Recent Labs    04/26/20 1228 04/27/20 0802 12/07/20 0201 12/08/20 0152 12/09/20 0122 12/10/20 0112 12/11/20 0150  HGB  10.7*   < > 7.9* 7.8* 8.6* 8.6* 8.2*  MCV 84.5   < > 91.4 92.5 92.7 93.8 93.2  VITAMINB12 451  --   --   --   --   --   --   FOLATE 8.7  --   --   --   --   --   --    FERRITIN 347*  --   --   --   --   --   --   TIBC 276  --   --   --   --   --   --   IRON 71  --   --   --   --   --   --   RETICCTPCT 1.8  --   --   --   --   --   --    < > = values in this interval not displayed.    Uncontrolled type 2 diabetes mellitus Hyperglycemia -A1c was 13.9 on May 2022.  Repeat A1c on 8/24 was at 5.9.  Wonder if it is truly low because of diet control in the hospital or spuriously low due to frequent blood loss and multiple transfusions.   -Currently on Jardiance 10 mg daily and sliding scale insulin.  Blood sugar is consistently under 150.  Okay to stop fingerstick blood sugar check.  CAD status post stenting in 03/2020 -Continue aspirin.  Because of recurrent bleeding, Brilinta was discontinued by cardiology on 11/19/2020.   -Once bleeding stabilizes, will plan to resume Brilinta.    HLD  -continue Vascepa and Lipitor  HIV -Continue current regimen of antiretrovirals and dapsone as per ID.   -Outpatient follow-up with ID   Mobility: PT eval obtained.  SNF recommended Code Status:   Code Status: Full Code  Nutritional status: Body mass index is 38.4 kg/m. Nutrition Problem: Increased nutrient needs Etiology: post-op healing Signs/Symptoms: estimated needs Diet:  Diet Order             Diet Carb Modified Fluid consistency: Thin; Room service appropriate? Yes  Diet effective now                  DVT prophylaxis: Not on heparin products because of frequent bleeding.  Bilateral BKA status, unable to use SCDs SCD's Start: 12/02/20 1135 Antimicrobials: None Fluid: Not on IV fluid Consultants: Orthopedics Family Communication: None at bedside  Status is: Inpatient  Remains inpatient appropriate because: Pending SNF  Dispo: The patient is from: Home              Anticipated d/c is to: SNF               Patient currently is medically stable to d/c.   Difficult to place patient No     Infusions:   sodium chloride 50 mL/hr at 12/11/20  0806   magnesium sulfate bolus IVPB     sodium chloride irrigation      Scheduled Meds:  (feeding supplement) PROSource Plus  30 mL Oral BID BM   alfuzosin  10 mg Oral Q breakfast   allopurinol  100 mg Oral Daily   vitamin C  1,000 mg Oral Daily   aspirin EC  81 mg Oral Daily   atorvastatin  80 mg Oral Daily   dapsone  100 mg Oral Daily   docusate sodium  100 mg Oral Daily   dolutegravir  50 mg Oral Daily   doravirine  100 mg Oral Daily  empagliflozin  10 mg Oral Daily   escitalopram  20 mg Oral Daily   finasteride  5 mg Oral Daily   gabapentin  100 mg Oral TID   icosapent Ethyl  2 g Oral BID   lamiVUDine  300 mg Oral Daily   multivitamin with minerals  1 tablet Oral Q24H   oxybutynin  2.5 mg Oral BID   oxyCODONE  10 mg Oral QID   pantoprazole  40 mg Oral Daily   senna  2 tablet Oral QHS   zinc sulfate  220 mg Oral Daily    Antimicrobials: Anti-infectives (From admission, onward)    Start     Dose/Rate Route Frequency Ordered Stop   12/02/20 1230  ceFAZolin (ANCEF) IVPB 2g/100 mL premix        2 g 200 mL/hr over 30 Minutes Intravenous Every 8 hours 12/02/20 1134 12/02/20 2245   12/02/20 0600  ceFAZolin (ANCEF) IVPB 3g/100 mL premix        3 g 200 mL/hr over 30 Minutes Intravenous On call to O.R. 12/01/20 1909 12/02/20 0950   12/01/20 0600  ceFAZolin (ANCEF) IVPB 2g/100 mL premix  Status:  Discontinued        2 g 200 mL/hr over 30 Minutes Intravenous On call to O.R. 11/30/20 1817 11/30/20 1819   12/01/20 0600  ceFAZolin (ANCEF) IVPB 3g/100 mL premix        3 g 200 mL/hr over 30 Minutes Intravenous On call to O.R. 11/30/20 1820 12/02/20 0559   11/18/20 0100  ceFAZolin (ANCEF) IVPB 2g/100 mL premix        2 g 200 mL/hr over 30 Minutes Intravenous Every 8 hours 11/17/20 2045 11/18/20 1038   11/17/20 0600  ceFAZolin (ANCEF) IVPB 3g/100 mL premix        3 g 200 mL/hr over 30 Minutes Intravenous On call to O.R. 11/16/20 1941 11/17/20 1901   11/11/20 1200  vancomycin  (VANCOREADY) IVPB 500 mg/100 mL  Status:  Discontinued        500 mg 100 mL/hr over 60 Minutes Intravenous Every T-Th-Sa (Hemodialysis) 11/09/20 1414 11/11/20 1328   11/10/20 0000  Dolutegravir-lamiVUDine (DOVATO) 50-300 MG TABS        1 tablet Oral Daily 11/10/20 1048     11/10/20 0000  doravirine (PIFELTRO) 100 MG TABS tablet        100 mg Oral Daily 11/10/20 1048     11/08/20 1000  dolutegravir (TIVICAY) tablet 50 mg        50 mg Oral Daily 11/07/20 1451     11/08/20 1000  lamiVUDine (EPIVIR) tablet 300 mg        300 mg Oral Daily 11/07/20 1451     11/08/20 1000  doravirine (PIFELTRO) tablet 100 mg        100 mg Oral Daily 11/07/20 1451     11/02/20 1130  ceFAZolin (ANCEF) IVPB 2g/100 mL premix        2 g 200 mL/hr over 30 Minutes Intravenous To Radiology 11/02/20 1042 11/02/20 1704   10/27/20 1245  ceFAZolin (ANCEF) IVPB 3g/100 mL premix        3 g 200 mL/hr over 30 Minutes Intravenous On call to O.R. 10/27/20 0755 10/27/20 1608   10/20/20 1415  linezolid (ZYVOX) tablet 600 mg  Status:  Discontinued        600 mg Oral Every 12 hours 10/20/20 1326 10/28/20 1115   10/19/20 1445  metroNIDAZOLE (FLAGYL) tablet 500 mg  Status:  Discontinued  500 mg Oral Every 12 hours 10/19/20 1347 10/28/20 1115   10/18/20 1600  ceFEPIme (MAXIPIME) 2 g in sodium chloride 0.9 % 100 mL IVPB  Status:  Discontinued        2 g 200 mL/hr over 30 Minutes Intravenous Every 12 hours 10/18/20 0414 10/28/20 1112   10/18/20 1000  dapsone tablet 100 mg        100 mg Oral Daily 10/18/20 0425     10/18/20 1000  bictegravir-emtricitabine-tenofovir AF (BIKTARVY) 50-200-25 MG per tablet 1 tablet  Status:  Discontinued        1 tablet Oral Daily 10/18/20 0610 11/07/20 1451   10/18/20 0415  metroNIDAZOLE (FLAGYL) IVPB 500 mg  Status:  Discontinued        500 mg 100 mL/hr over 60 Minutes Intravenous Every 8 hours 10/18/20 0404 10/19/20 1347   10/18/20 0415  ceFEPIme (MAXIPIME) 2 g in sodium chloride 0.9 % 100 mL  IVPB        2 g 200 mL/hr over 30 Minutes Intravenous  Once 10/18/20 0414 10/18/20 0500   10/18/20 0345  vancomycin (VANCOREADY) IVPB 2000 mg/400 mL        2,000 mg 200 mL/hr over 120 Minutes Intravenous  Once 10/18/20 0335 10/18/20 0608       PRN meds: acetaminophen, alum & mag hydroxide-simeth, bisacodyl, diphenoxylate-atropine, guaiFENesin-dextromethorphan, hydrALAZINE, HYDROmorphone (DILAUDID) injection, labetalol, magnesium sulfate bolus IVPB, metoprolol tartrate, ondansetron, oxyCODONE, oxyCODONE, phenol, polyethylene glycol, potassium chloride   Objective: Vitals:   12/11/20 0803 12/11/20 1518  BP: (!) 122/58 129/83  Pulse: 79 91  Resp: 16 17  Temp: 98.7 F (37.1 C) 98.3 F (36.8 C)  SpO2: 100% 100%    Intake/Output Summary (Last 24 hours) at 12/11/2020 1643 Last data filed at 12/11/2020 1500 Gross per 24 hour  Intake 420 ml  Output 1050 ml  Net -630 ml    Filed Weights   11/20/20 0618 11/23/20 0618 11/23/20 2144  Weight: 117.3 kg 121.4 kg 121.4 kg   Weight change:  Body mass index is 38.4 kg/m.   Physical Exam: General exam: Patient is awake and alert.  Patient is not in any distress.  S HEENT: Patient has pallor.  No jaundice  Lungs: Clear to auscultation bilaterally CVS: Regular rate and rhythm, no murmur GI/Abd soft, nontender, nondistended, bowel sound present CNS: Alert, awake, oriented x3 Psychiatry: Mood appropriate Extremities: Prior right BKA status. New left BKA with bandage around lower leg.  Not bleeding at this time.  Data Review: I have personally reviewed the laboratory data and studies available.  Recent Labs  Lab 12/07/20 0201 12/08/20 0152 12/09/20 0122 12/10/20 0112 12/11/20 0150  WBC 11.7* 9.6 11.1* 9.8 10.3  NEUTROABS 8.9* 6.9 8.2* 7.1 7.3  HGB 7.9* 7.8* 8.6* 8.6* 8.2*  HCT 24.6* 24.5* 26.6* 27.4* 26.2*  MCV 91.4 92.5 92.7 93.8 93.2  PLT 410* 410* 420* 420* 404*    Recent Labs  Lab 12/07/20 0201 12/08/20 0152  12/09/20 0122 12/10/20 0112 12/11/20 0150 12/11/20 1354  NA 133* 135 135 135 135 137  K 4.6 4.6 5.1 4.6 4.8 4.9  CL 101 102 100 101 103 103  CO2 '23 25 26 24 24 24  '$ GLUCOSE 135* 134* 142* 164* 129* 152*  BUN 25* 23* 25* 29* 29* 31*  CREATININE 2.15* 2.01* 2.09* 2.02* 2.04* 2.33*  CALCIUM 8.6* 9.1 9.1 8.9 9.3 9.1  MG 2.2 2.0 2.1 2.2 2.2  --   PHOS 4.3 4.5 4.8* 5.0* 4.8*  --  F/u labs ordered Unresulted Labs (From admission, onward)     Start     Ordered   12/07/20 0500  T-helper cells (CD4) count (not at [facility])  Tomorrow morning,   R       Question:  Specimen collection method  Answer:  Lab=Lab collect   12/06/20 1027   12/03/20 0500  CBC  Daily,   R     Question:  Specimen collection method  Answer:  Lab=Lab collect   12/02/20 1134   11/21/20 0500  CBC with Differential/Platelet  Daily,   R     Question:  Specimen collection method  Answer:  Lab=Lab collect   11/20/20 1010   11/21/20 0500  Magnesium  Daily,   R     Question:  Specimen collection method  Answer:  Lab=Lab collect   11/20/20 1010   11/02/20 0500  Renal function panel  Daily,   R     Question:  Specimen collection method  Answer:  Lab=Lab collect   11/01/20 1153            Time spent: 25 minutes.  Signed, Bonnell Public, MD [facility] 12/11/2020

## 2020-12-11 NOTE — Progress Notes (Signed)
Patient has bloody urine this morning not clots seen.Denies pain with urination.Text paged NP X.Blount awaiting a response.

## 2020-12-11 NOTE — Progress Notes (Addendum)
Patient compalianing he is having a hard time urinating, bladder scan showing 278. Will inform MD. Paged.

## 2020-12-11 NOTE — Progress Notes (Signed)
9 Days Post-Op Subjective: Patient reports patient noted gross hematuria this morning and passed multiple clots. Nursing attempted to place a foley which was unsuccessful. He is currently having sharp abdominal pain and urgency to urinate.  Objective: Vital signs in last 24 hours: Temp:  [97.9 F (36.6 C)-98.7 F (37.1 C)] 98.6 F (37 C) (09/05 1959) Pulse Rate:  [79-91] 88 (09/05 1959) Resp:  [16-20] 20 (09/05 1959) BP: (115-129)/(58-83) 120/69 (09/05 1959) SpO2:  [95 %-100 %] 95 % (09/05 1959)  Intake/Output from previous day: 09/04 0701 - 09/05 0700 In: 820 [P.O.:820] Out: 1820 [Urine:1820] Intake/Output this shift: No intake/output data recorded.  Physical Exam:  General:alert, cooperative, and appears stated age GI: soft, distended, and tenderness: suprapubic Male genitalia: Penis: normal, no lesions Urethral Meatus: normal Testicles: normal, no masses Cardio: regular rate and rhythm, S1, S2 normal, no murmur, click, rub or gallop  Lab Results: Recent Labs    12/09/20 0122 12/10/20 0112 12/11/20 0150  HGB 8.6* 8.6* 8.2*  HCT 26.6* 27.4* 26.2*   BMET Recent Labs    12/11/20 0150 12/11/20 1354  NA 135 137  K 4.8 4.9  CL 103 103  CO2 24 24  GLUCOSE 129* 152*  BUN 29* 31*  CREATININE 2.04* 2.33*  CALCIUM 9.3 9.1   No results for input(s): LABPT, INR in the last 72 hours. No results for input(s): LABURIN in the last 72 hours. Results for orders placed or performed during the hospital encounter of 10/17/20  Blood culture (routine x 2)     Status: None   Collection Time: 10/17/20  6:30 PM   Specimen: BLOOD LEFT HAND  Result Value Ref Range Status   Specimen Description BLOOD LEFT HAND  Final   Special Requests   Final    BOTTLES DRAWN AEROBIC AND ANAEROBIC Blood Culture results may not be optimal due to an inadequate volume of blood received in culture bottles   Culture   Final    NO GROWTH 5 DAYS Performed at [facility], [place] [address]  [place], [place] [address]    Report Status 10/22/2020 FINAL  Final  Blood culture (routine x 2)     Status: None   Collection Time: 10/18/20  4:39 AM   Specimen: BLOOD RIGHT WRIST  Result Value Ref Range Status   Specimen Description BLOOD RIGHT WRIST  Final   Special Requests   Final    BOTTLES DRAWN AEROBIC AND ANAEROBIC Blood Culture results may not be optimal due to an inadequate volume of blood received in culture bottles   Culture   Final    NO GROWTH 5 DAYS Performed at [facility], [place] [address] [place], [place] [address]    Report Status 10/23/2020 FINAL  Final  SARS CORONAVIRUS 2 (TAT 6-24 HRS) Nasopharyngeal Nasopharyngeal Swab     Status: None   Collection Time: 10/18/20  5:23 AM   Specimen: Nasopharyngeal Swab  Result Value Ref Range Status   SARS Coronavirus 2 NEGATIVE NEGATIVE Final    Comment: (NOTE) SARS-CoV-2 target nucleic acids are NOT DETECTED.  The SARS-CoV-2 RNA is generally detectable in upper and lower respiratory specimens during the acute phase of infection. Negative results do not preclude SARS-CoV-2 infection, do not rule out co-infections with other pathogens, and should not be used as the sole basis for treatment or other patient management decisions. Negative results must be combined with clinical observations, patient history, and epidemiological information. The expected result is Negative.  Fact Sheet  for Patients: SugarRoll.be  Fact Sheet for Healthcare Providers: https://www.woods-mathews.com/  This test is not yet approved or cleared by the [place] FDA and  has been authorized for detection and/or diagnosis of SARS-CoV-2 by FDA under an Emergency Use Authorization (EUA). This EUA will remain  in effect (meaning this test can be used) for the duration of the COVID-19 declaration under Se ction 564(b)(1) of the Act, 21 U.S.C. section 360bbb-3(b)(1), unless the authorization is terminated  or revoked sooner.  Performed at [facility], [place] [address], [place], [place] [address]   MRSA Next Gen by PCR, Nasal     Status: None   Collection Time: 10/18/20  6:16 AM   Specimen: Nasal Mucosa; Nasal Swab  Result Value Ref Range Status   MRSA by PCR Next Gen NOT DETECTED NOT DETECTED Final    Comment: (NOTE) The GeneXpert MRSA Assay (FDA approved for NASAL specimens only), is one component of a comprehensive MRSA colonization surveillance program. It is not intended to diagnose MRSA infection nor to guide or monitor treatment for MRSA infections. Test performance is not FDA approved in patients less than 66 years old. Performed at [facility], [place] [address], [place], [place] [address]   Surgical pcr screen     Status: None   Collection Time: 10/27/20  9:52 AM   Specimen: Nasal Mucosa; Nasal Swab  Result Value Ref Range Status   MRSA, PCR NEGATIVE NEGATIVE Final   Staphylococcus aureus NEGATIVE NEGATIVE Final    Comment: (NOTE) The Xpert SA Assay (FDA approved for NASAL specimens in patients 74 years of age and older), is one component of a comprehensive surveillance program. It is not intended to diagnose infection nor to guide or monitor treatment. Performed at [facility], [place] [address] [place], [place] [address]   Surgical PCR screen     Status: Abnormal   Collection Time: 12/01/20  8:24 AM   Specimen: Nasal Mucosa; Nasal Swab  Result Value Ref Range Status   MRSA, PCR POSITIVE (A) NEGATIVE Final    Comment: RESULT CALLED TO, READ BACK BY AND VERIFIED WITH: CAITLYN FOSTER RN 12/01/2020 '@1112'$  BY JW    Staphylococcus aureus POSITIVE (A) NEGATIVE Final    Comment: (NOTE) The Xpert SA Assay (FDA approved for NASAL specimens in patients 72 years of age and older), is one component of a comprehensive surveillance program. It is not intended to diagnose infection nor to guide or monitor treatment. Performed at [facility], [place]  [address], [place], [place] [address]     Studies/Results: US RENAL  Result Date: 12/11/2020 CLINICAL DATA:  Gross hematuria, history HIV/aids, coronary artery disease, stage IV chronic kidney disease, type II diabetes mellitus, hypertension EXAM: RENAL / URINARY TRACT ULTRASOUND COMPLETE COMPARISON:  11/01/2020 FINDINGS: Right Kidney: Renal measurements: 11.7 x 5.5 x 5.4 cm = volume: 183 mL. Normal cortical thickness. Slightly increased cortical echogenicity. No mass, hydronephrosis, or shadowing calcification. Left Kidney: Renal measurements: 11.3 x 5.0 x 5.8 cm = volume: 172 mL. Normal cortical thickness. Slightly increased cortical echogenicity. No mass, hydronephrosis, or shadowing calcification. Bladder: Foley catheter decompresses urinary bladder. Bladder wall appears thickened but this may be an artifact related to underdistention. Other: N/A IMPRESSION: Medical renal disease changes of both kidneys. No evidence of renal mass, hydronephrosis, or calculi. Electronically Signed   By: Lavonia Dana M.D.   On: 12/11/2020 16:37    Assessment/Plan: Gross hematuria 20 french foley placed and 10cc of  clot irrigated from bladder. CBI started and titrated to a slow drip. Please continue to wean CBI   LOS: 54 days   Nicolette Bang 12/11/2020, 8:19 PM

## 2020-12-11 NOTE — TOC Progression Note (Signed)
Transition of Care [facility]) - Progression Note    Patient Details  Name: WAITMAN GAMMAGE MRN: [id number] Date of Birth: 01/14/1966  [facility] CM/SW Contact  Milinda Antis, LCSWA Phone Number: 12/11/2020, 11:42 AM  Clinical Narrative:    11:42-  CSW contacted [name] with admissions at [facility] to inquire about the status of insurance authorization.  There was no answer.  CSW left message requesting an update.  Pending : insurance auth.   Expected Discharge Plan: Skilled Nursing Facility Barriers to Discharge: SNF Pending bed offer  Expected Discharge Plan and Services Expected Discharge Plan: [place] In-house Referral: Clinical Social Work Discharge Planning Services: CM Consult Post Acute Care Choice: [place] arrangements for the past 2 months: Single Family Home                                       Social Determinants of Health (SDOH) Interventions    Readmission Risk Interventions Readmission Risk Prevention Plan 10/21/2020 11/13/2018 11/13/2018  Transportation Screening Complete - Complete  PCP or Specialist Appt within 3-5 Days - - -  HRI or [place] Work Consult for [place] - - -  Medication Review Press photographer) Complete - Complete  PCP or Specialist appointment within 3-5 days of discharge Complete Complete -  [place] or Home Care Consult Complete Complete -  SW Recovery Care/Counseling Consult Complete Complete -  Palliative Care Screening Not Applicable Not Applicable -  [place] Complete Not Applicable -  Some encounter information is confidential and restricted. Go to Review Flowsheets activity to see all data.  Some recent data might be hidden

## 2020-12-11 NOTE — Progress Notes (Signed)
New orders received for IVF's .

## 2020-12-12 ENCOUNTER — Inpatient Hospital Stay: Payer: Medicare HMO | Admitting: Infectious Diseases

## 2020-12-12 DIAGNOSIS — N179 Acute kidney failure, unspecified: Secondary | ICD-10-CM | POA: Diagnosis not present

## 2020-12-12 LAB — RENAL FUNCTION PANEL
Albumin: 2.3 g/dL — ABNORMAL LOW (ref 3.5–5.0)
Anion gap: 7 (ref 5–15)
BUN: 32 mg/dL — ABNORMAL HIGH (ref 6–20)
CO2: 24 mmol/L (ref 22–32)
Calcium: 9.1 mg/dL (ref 8.9–10.3)
Chloride: 106 mmol/L (ref 98–111)
Creatinine, Ser: 2.12 mg/dL — ABNORMAL HIGH (ref 0.61–1.24)
GFR, Estimated: 36 mL/min — ABNORMAL LOW (ref >60)
Glucose, Bld: 107 mg/dL — ABNORMAL HIGH (ref 70–99)
Phosphorus: 5 mg/dL — ABNORMAL HIGH (ref 2.5–4.6)
Potassium: 5 mmol/L (ref 3.5–5.1)
Sodium: 137 mmol/L (ref 135–145)

## 2020-12-12 LAB — CBC WITH DIFFERENTIAL/PLATELET
Abs Immature Granulocytes: 0.06 10*3/uL (ref 0.00–0.07)
Basophils Absolute: 0.1 10*3/uL (ref 0.0–0.1)
Basophils Relative: 1 %
Eosinophils Absolute: 0.3 10*3/uL (ref 0.0–0.5)
Eosinophils Relative: 3 %
HCT: 25 % — ABNORMAL LOW (ref 39.0–52.0)
Hemoglobin: 8.1 g/dL — ABNORMAL LOW (ref 13.0–17.0)
Immature Granulocytes: 1 %
Lymphocytes Relative: 15 %
Lymphs Abs: 1.5 10*3/uL (ref 0.7–4.0)
MCH: 29.8 pg (ref 26.0–34.0)
MCHC: 32.4 g/dL (ref 30.0–36.0)
MCV: 91.9 fL (ref 80.0–100.0)
Monocytes Absolute: 0.7 10*3/uL (ref 0.1–1.0)
Monocytes Relative: 6 %
Neutro Abs: 7.8 10*3/uL — ABNORMAL HIGH (ref 1.7–7.7)
Neutrophils Relative %: 74 %
Platelets: 381 10*3/uL (ref 150–400)
RBC: 2.72 MIL/uL — ABNORMAL LOW (ref 4.22–5.81)
RDW: 14.5 % (ref 11.5–15.5)
WBC: 10.4 10*3/uL (ref 4.0–10.5)
nRBC: 0 % (ref 0.0–0.2)

## 2020-12-12 LAB — MAGNESIUM: Magnesium: 2.2 mg/dL (ref 1.7–2.4)

## 2020-12-12 NOTE — Progress Notes (Signed)
10 Days Post-Op Subjective: Denies pain. No nausea or emesis. Tolerating foley with clear yellow urine.  Objective: Vital signs in last 24 hours: Temp:  [98.4 F (36.9 C)-98.8 F (37.1 C)] 98.4 F (36.9 C) (09/06 1445) Pulse Rate:  [73-88] 73 (09/06 1445) Resp:  [18-20] 18 (09/06 1445) BP: (120-137)/(69-92) 137/79 (09/06 1445) SpO2:  [95 %-99 %] 97 % (09/06 1445)  Intake/Output from previous day: 09/05 0701 - 09/06 0700 In: 3340 [P.O.:240] Out: 6550 [Urine:6550] Intake/Output this shift: Total I/O In: 600 [Other:600] Out: 2700 [Urine:2700]  Physical Exam:  General: Alert and oriented CV: RRR Lungs: Clear Abdomen: Soft, ND, NT  Lab Results: Recent Labs    12/10/20 0112 12/11/20 0150 12/12/20 0256  HGB 8.6* 8.2* 8.1*  HCT 27.4* 26.2* 25.0*   BMET Recent Labs    12/11/20 1354 12/12/20 0256  NA 137 137  K 4.9 5.0  CL 103 106  CO2 24 24  GLUCOSE 152* 107*  BUN 31* 32*  CREATININE 2.33* 2.12*  CALCIUM 9.1 9.1     Studies/Results: US RENAL  Result Date: 12/11/2020 CLINICAL DATA:  Gross hematuria, history HIV/aids, coronary artery disease, stage IV chronic kidney disease, type II diabetes mellitus, hypertension EXAM: RENAL / URINARY TRACT ULTRASOUND COMPLETE COMPARISON:  11/01/2020 FINDINGS: Right Kidney: Renal measurements: 11.7 x 5.5 x 5.4 cm = volume: 183 mL. Normal cortical thickness. Slightly increased cortical echogenicity. No mass, hydronephrosis, or shadowing calcification. Left Kidney: Renal measurements: 11.3 x 5.0 x 5.8 cm = volume: 172 mL. Normal cortical thickness. Slightly increased cortical echogenicity. No mass, hydronephrosis, or shadowing calcification. Bladder: Foley catheter decompresses urinary bladder. Bladder wall appears thickened but this may be an artifact related to underdistention. Other: N/A IMPRESSION: Medical renal disease changes of both kidneys. No evidence of renal mass, hydronephrosis, or calculi. Electronically Signed   By: Lavonia Dana M.D.   On: 12/11/2020 16:37    Assessment/Plan: Gross hematuria: S/p clot evacuation, fulguration of prostate on 11/09/2020. Restarted however resolving. RBUS 12/11/2020 with no clot in bladder. PMH of HIV, CKD III, DM II, HTN, CAD, and bilateral BKA  -Urine is clear on slow drip. Clamped CBI. Titrate as needed. If remains clear, plan for void trial tomorrow. -Continue finasteride '5mg'$  daily -I messaged schedulers to arrange followup   LOS: 55 days   Matt R. Elfida Shimada MD 12/12/2020, 4:37 PM [facility]  Pager: [phone]

## 2020-12-12 NOTE — Progress Notes (Signed)
Physical Therapy Treatment Patient Details Name: Keith [name] MRN: [id number] DOB: December 26, 1965 Today's Date: 12/12/2020    History of Present Illness 55 yo male admitted 10/17/20 after being sent to ED by his orthopedic provider with concerns for L heel ulcer.  He has failed conservative measure and presents with s/p L BKA on 10/27/20. s/p revision to L BKA on 11/17/20 & again on 12/02/20.  PMH: HIV, CKD 3, DM type 2, HTN, HLD, CAD, R BKA.    PT Comments    Pt was seen for mobility on side of bed, and discussed the need for his limb protector to be applied to LLE.  Pt declined to use it until he was in the recliner, and discussed the way he transfers is risky without it.  Pt is not clear that he is supposed to be wearing the splint at all times OOB, so may not benefit as much in maintaining ROM on L knee.  Follow up with SNF as planned since pt is strong enough to do this treatment, and has good expectations for his ability to benefit from the therapy to get home.  Follow Up Recommendations  SNF     Equipment Recommendations  Wheelchair (measurements PT);Wheelchair cushion (measurements PT)    Recommendations for Other Services OT consult     Precautions / Restrictions Precautions Precautions: Fall Precaution Comments: B BKA's: R healed,  L BKA rev 8/27, RLE prosthesis, wound vac L leg Required Braces or Orthoses: Other Brace Other Brace: L limb protector to be worn when up and OOB - pt is non compliant with use. Restrictions Weight Bearing Restrictions: Yes RLE Weight Bearing: Weight bearing as tolerated (in prosthesis) LLE Weight Bearing: Non weight bearing Other Position/Activity Restrictions: NWB on left    Mobility  Bed Mobility Overal bed mobility: Needs Assistance Bed Mobility: Supine to Sit     Supine to sit: HOB elevated;Modified independent (Device/Increase time)          Transfers Overall transfer level: Needs assistance Equipment used: None Transfers:  Lateral/Scoot Transfers          Lateral/Scoot Transfers: Min assist General transfer comment: min assist to try to limit pt use of LLE to push into it and get to chair.  Refused to don RLE prosthesis and talked with him about the goals of therapy.  Pt is not willing to try a SPT with prosthetic  Ambulation/Gait             General Gait Details: declines to try   Stairs             Wheelchair Mobility    Modified Rankin (Stroke Patients Only)       Balance Overall balance assessment: Needs assistance Sitting-balance support: No upper extremity supported;Feet supported Sitting balance-Leahy Scale: Good         Standing balance comment: unable to try due to pt declining prosthesis                            Cognition Arousal/Alertness: Awake/alert Behavior During Therapy: Flat affect;Agitated Overall Cognitive Status: Impaired/Different from baseline Area of Impairment: Safety/judgement;Following commands;Problem solving;Awareness;Memory;Attention                   Current Attention Level: Selective Memory: Decreased recall of precautions Following Commands: Follows one step commands consistently Safety/Judgement: Decreased awareness of safety;Decreased awareness of deficits Awareness: Emergent Problem Solving: Slow processing;Decreased initiation;Difficulty sequencing;Requires verbal cues;Requires tactile cues General  Comments: Pt is semi-compliant with PT and OT, and finally at end of session allowed the therapists to apply the limb protector.  Pt is reminded of the goals for therapy and he is still unconvinced that he should regularly use the protector      Exercises      General Comments General comments (skin integrity, edema, etc.): limb protector is applied and discussed fit and purpose.  Pt is refusing to don until he has gotten to chair, despite discussion about traumatizing LLE in the process of moving to chair       Pertinent Vitals/Pain Pain Assessment: Faces Faces Pain Scale: Hurts a little bit Pain Location: L residual limb Pain Descriptors / Indicators: Grimacing Pain Intervention(s): Monitored during session;Repositioned    Home Living                      Prior Function            PT Goals (current goals can now be found in the care plan section) Acute Rehab PT Goals Patient Stated Goal: get out of the room Progress towards PT goals: PT to reassess next treatment    Frequency    Min 3X/week      PT Plan Current plan remains appropriate    Co-evaluation PT/OT/SLP Co-Evaluation/Treatment: Yes Reason for Co-Treatment: For patient/therapist safety;To address functional/ADL transfers PT goals addressed during session: Mobility/safety with mobility;Proper use of DME OT goals addressed during session: ADL's and self-care;Proper use of Adaptive equipment and DME      AM-PAC PT "6 Clicks" Mobility   Outcome Measure  Help needed turning from your back to your side while in a flat bed without using bedrails?: A Little Help needed moving from lying on your back to sitting on the side of a flat bed without using bedrails?: A Little Help needed moving to and from a bed to a chair (including a wheelchair)?: A Little Help needed standing up from a chair using your arms (e.g., wheelchair or bedside chair)?: A Lot Help needed to walk in hospital room?: Total Help needed climbing 3-5 steps with a railing? : Total 6 Click Score: 13    End of Session Equipment Utilized During Treatment: Other (comment) Activity Tolerance: Treatment limited secondary to medical complications (Comment) Patient left: with call bell/phone within reach;in chair;with chair alarm set Nurse Communication: Mobility status PT Visit Diagnosis: Other abnormalities of gait and mobility (R26.89);Pain;Muscle weakness (generalized) (M62.81);Difficulty in walking, not elsewhere classified (R26.2) Pain -  Right/Left: Left Pain - part of body: Leg     Time: [id number] PT Time Calculation (min) (ACUTE ONLY): 25 min  Charges:  $Therapeutic Activity: 8-22 mins                Ramond Dial 12/12/2020, 5:43 PM  Mee Hives, PT MS Acute Rehab Dept. Number: [place]

## 2020-12-12 NOTE — Progress Notes (Signed)
Nutrition Follow-up  DOCUMENTATION CODES:   Obesity unspecified  INTERVENTION:   -D/c Prosource Plus -MVI with minerals daily -Continue Magic cup TID with meals, each supplement provides 290 kcal and 9 grams of protein   NUTRITION DIAGNOSIS:   Increased nutrient needs related to post-op healing as evidenced by estimated needs.  Ongoing  GOAL:   Patient will meet greater than or equal to 90% of their needs  Progressing   MONITOR:   PO intake, Supplement acceptance, Labs, Weight trends, Skin, I & O's  REASON FOR ASSESSMENT:   LOS    ASSESSMENT:   Keith [name] is a 55 y.o. male with PMH significant for DM2, HTN, HLD, CAD, CKD 3B, HIV, right BKA status.  7/22- s/p lt BKA 8/12- s/p BKA revision 8/27- s/p BKA revision  Reviewed I/O's: -3.2 L x 24 hours and -9.1 L since 11/28/20  UOP: 6.6 L x 24 hours  Pt unavailable at time of visit.   Pt continues with good oral intake. Noted meal completion 100%. Pt is refusing Prosource supplements.   Pt awaiting insurance authorization for SNF placement.   Medications reviewed and include vitamin C, colace, tivicay, senokot, and zinc sulfate.   Labs reviewed: CBGS: 105-140 (inpatient orders for glycemic control are 10 mg empagliflozin daily).    Diet Order:   Diet Order             Diet Carb Modified Fluid consistency: Thin; Room service appropriate? Yes  Diet effective now                   EDUCATION NEEDS:   Education needs have been addressed  Skin:  Skin Assessment: Skin Integrity Issues: Skin Integrity Issues:: Wound VAC Wound Vac: lt BKA  Last BM:  12/09/20  Height:   Ht Readings from Last 1 Encounters:  10/17/20 '5\' 10"'$  (1.778 m)    Weight:   Wt Readings from Last 1 Encounters:  11/23/20 121.4 kg    Ideal Body Weight:  65.6 kg (adjusted for bilateral BKAs)  BMI:  Body mass index is 38.4 kg/m.  Estimated Nutritional Needs:   Kcal:  2000-2000  Protein:  100-115 grams  Fluid:  > 2  L    Loistine Chance, RD, LDN, [place] Registered Dietitian II Certified Diabetes Care and Education Specialist Please refer to [facility] for RD and/or RD on-call/weekend/after hours pager

## 2020-12-12 NOTE — Progress Notes (Signed)
PROGRESS NOTE  Keith [name] [id number] DOB: 1966-02-25 DOA: 10/17/2020 [name], NP  HPI/Recap of past 24 hours: Patient seen and examined at bedside. Patient developed frank hematuria yesterday and he has had recurrent hematuria.  Urology was reconsulted and he ordered continuous bladder irrigation and possible cystoscopy.  Assessment/Plan: Principal Problem:   AKI (acute kidney injury) ([place]) Active Problems:   Elevated blood pressure reading with diagnosis of hypertension   HIV disease ([place])   Uncontrolled type 2 diabetes mellitus with hyperglycemia (HCC)   Cellulitis   Ulcer of heel due to diabetes mellitus (HCC)   Normochromic normocytic anemia   Cellulitis of left foot   Acute osteomyelitis of left calcaneus (HCC)   Dehiscence of amputation stump (HCC)   Hx of BKA, left (HCC)  #1 acute kidney injury on chronic kidney disease with metabolic acidosis He was admitted with worsening renal function he was started on hemodialysis for this but his renal function improved and hemodialysis was discontinued Renal ultrasound was negative for hydronephrosis  2.  Acute and recurrent hematuria Urology consulted Continue bladder irrigation as recommended until clear His hemoglobin is improved from 7.8 yesterday.  He has received multiple blood transfusion this admission Continue to monitor his hemoglobin  3.  Chronic coronary artery disease status post stents December 2021 Continue aspirin Because of the recurrent bleeding Brilinta was discontinued by cardiology on November 19, 2020 Once bleeding stabilizes plan will be to resume Brilinta  4.  HIV continue current antiretroviral's and dapsone per ID  5.  type 2 diabetes mellitus with hyperglycemia Current hemoglobin A1c is 5.9 it was 13.9 Continue Jardiance and sliding scale insulin Code Status: Patient is full code  Severity of Illness: The appropriate patient status for this patient is INPATIENT. Inpatient  status is judged to be reasonable and necessary in order to provide the required intensity of service to ensure the patient's safety. The patient's presenting symptoms, physical exam findings, and initial radiographic and laboratory data in the context of their chronic comorbidities is felt to place them at high risk for further clinical deterioration. Furthermore, it is not anticipated that the patient will be medically stable for discharge from the hospital within 2 midnights of admission. The following factors support the patient status of inpatient.  Patient had hematuria and is currently on continuous bladder irrigation   * I certify that at the point of admission it is my clinical judgment that the patient will require inpatient hospital care spanning beyond 2 midnights from the point of admission due to high intensity of service, high risk for further deterioration and high frequency of surveillance required.*   Family Communication: None  Disposition Plan: For SNF Status is: Inpatient   Dispo: The patient is from: Home              Anticipated d/c is to: SNF              Anticipated d/c date is:               Patient currently not medically stable for discharge  Consultants: Neurology  Procedures: None  Antimicrobials: None currently Antiviral  DVT prophylaxis: SCD: Not on anticoagulation due to frequent bleeding   Objective: Vitals:   12/11/20 0803 12/11/20 1518 12/11/20 1959 12/12/20 0837  BP: (!) 122/58 129/83 120/69 (!) 132/92  Pulse: 79 91 88 88  Resp: '16 17 20 18  '$ Temp: 98.7 F (37.1 C) 98.3 F (36.8 C) 98.6 F (37 C)  98.8 F (37.1 C)  TempSrc: Oral  Oral Oral  SpO2: 100% 100% 95% 99%  Weight:      Height:        Intake/Output Summary (Last 24 hours) at 12/12/2020 0855 Last data filed at 12/12/2020 0757 Gross per 24 hour  Intake 3340 ml  Output 6550 ml  Net -3210 ml   Filed Weights   11/20/20 0618 11/23/20 0618 11/23/20 2144  Weight: 117.3 kg 121.4 kg  121.4 kg   Body mass index is 38.4 kg/m.  Exam:  General: 55 y.o. year-old male well developed well nourished in no acute distress.  Alert and oriented x3.  Obese Cardiovascular: Regular rate and rhythm with no rubs or gallops.  No thyromegaly or JVD noted.   Respiratory: Clear to auscultation with no wheezes or rales. Good inspiratory effort. Abdomen: Soft nontender nondistended with normal bowel sounds x4 quadrants. Musculoskeletal: Left BKA Skin: No ulcerative lesions noted or rashes, Psychiatry: Mood is appropriate for condition and setting Neurology:    Data Reviewed: CBC: Recent Labs  Lab 12/08/20 0152 12/09/20 0122 12/10/20 0112 12/11/20 0150 12/12/20 0256  WBC 9.6 11.1* 9.8 10.3 10.4  NEUTROABS 6.9 8.2* 7.1 7.3 7.8*  HGB 7.8* 8.6* 8.6* 8.2* 8.1*  HCT 24.5* 26.6* 27.4* 26.2* 25.0*  MCV 92.5 92.7 93.8 93.2 91.9  PLT 410* 420* 420* 404* [id number]   Basic Metabolic Panel: Recent Labs  Lab 12/08/20 0152 12/09/20 0122 12/10/20 0112 12/11/20 0150 12/11/20 1354 12/12/20 0256  NA 135 135 135 135 137 137  K 4.6 5.1 4.6 4.8 4.9 5.0  CL 102 100 101 103 103 106  CO2 '25 26 24 24 24 24  '$ GLUCOSE 134* 142* 164* 129* 152* 107*  BUN 23* 25* 29* 29* 31* 32*  CREATININE 2.01* 2.09* 2.02* 2.04* 2.33* 2.12*  CALCIUM 9.1 9.1 8.9 9.3 9.1 9.1  MG 2.0 2.1 2.2 2.2  --  2.2  PHOS 4.5 4.8* 5.0* 4.8*  --  5.0*   GFR: Estimated Creatinine Clearance: 50.8 mL/min (A) (by C-G formula based on SCr of 2.12 mg/dL (H)). Liver Function Tests: Recent Labs  Lab 12/08/20 0152 12/09/20 0122 12/10/20 0112 12/11/20 0150 12/12/20 0256  ALBUMIN 2.2* 2.3* 2.3* 2.4* 2.3*   No results for input(s): LIPASE, AMYLASE in the last 168 hours. No results for input(s): AMMONIA in the last 168 hours. Coagulation Profile: No results for input(s): INR, PROTIME in the last 168 hours. Cardiac Enzymes: No results for input(s): CKTOTAL, CKMB, CKMBINDEX, TROPONINI in the last 168 hours. BNP (last 3  results) No results for input(s): PROBNP in the last 8760 hours. HbA1C: No results for input(s): HGBA1C in the last 72 hours. CBG: Recent Labs  Lab 12/10/20 2134 12/11/20 0620 12/11/20 1130 12/11/20 1712 12/11/20 2052  GLUCAP 139* 126* 114* 140* 105*   Lipid Profile: No results for input(s): CHOL, HDL, LDLCALC, TRIG, CHOLHDL, LDLDIRECT in the last 72 hours. Thyroid Function Tests: No results for input(s): TSH, T4TOTAL, FREET4, T3FREE, THYROIDAB in the last 72 hours. Anemia Panel: No results for input(s): VITAMINB12, FOLATE, FERRITIN, TIBC, IRON, RETICCTPCT in the last 72 hours. Urine analysis:    Component Value Date/Time   COLORURINE STRAW (A) 10/07/2020 1405   APPEARANCEUR CLEAR 10/07/2020 1405   LABSPEC 1.016 10/07/2020 1405   PHURINE 7.0 10/07/2020 1405   GLUCOSEU >=500 (A) 10/07/2020 1405   HGBUR NEGATIVE 10/07/2020 1405   BILIRUBINUR NEGATIVE 10/07/2020 1405   BILIRUBINUR negative 09/07/2018 [place] 10/07/2020 1405  PROTEINUR 100 (A) 10/07/2020 1405   UROBILINOGEN 1.0 09/07/2018 1034   UROBILINOGEN 1.0 01/01/2017 1032   NITRITE NEGATIVE 10/07/2020 1405   LEUKOCYTESUR NEGATIVE 10/07/2020 1405   Sepsis Labs: '@LABRCNTIP'$ (procalcitonin:4,lacticidven:4)  )No results found for this or any previous visit (from the past 240 hour(s)).    Studies: US RENAL  Result Date: 12/11/2020 CLINICAL DATA:  Gross hematuria, history HIV/aids, coronary artery disease, stage IV chronic kidney disease, type II diabetes mellitus, hypertension EXAM: RENAL / URINARY TRACT ULTRASOUND COMPLETE COMPARISON:  11/01/2020 FINDINGS: Right Kidney: Renal measurements: 11.7 x 5.5 x 5.4 cm = volume: 183 mL. Normal cortical thickness. Slightly increased cortical echogenicity. No mass, hydronephrosis, or shadowing calcification. Left Kidney: Renal measurements: 11.3 x 5.0 x 5.8 cm = volume: 172 mL. Normal cortical thickness. Slightly increased cortical echogenicity. No mass,  hydronephrosis, or shadowing calcification. Bladder: Foley catheter decompresses urinary bladder. Bladder wall appears thickened but this may be an artifact related to underdistention. Other: N/A IMPRESSION: Medical renal disease changes of both kidneys. No evidence of renal mass, hydronephrosis, or calculi. Electronically Signed   By: Lavonia Dana M.D.   On: 12/11/2020 16:37    Scheduled Meds:  (feeding supplement) PROSource Plus  30 mL Oral BID BM   alfuzosin  10 mg Oral Q breakfast   allopurinol  100 mg Oral Daily   vitamin C  1,000 mg Oral Daily   aspirin EC  81 mg Oral Daily   atorvastatin  80 mg Oral Daily   Chlorhexidine Gluconate Cloth  6 each Topical Daily   dapsone  100 mg Oral Daily   docusate sodium  100 mg Oral Daily   dolutegravir  50 mg Oral Daily   doravirine  100 mg Oral Daily   empagliflozin  10 mg Oral Daily   escitalopram  20 mg Oral Daily   finasteride  5 mg Oral Daily   gabapentin  100 mg Oral TID   icosapent Ethyl  2 g Oral BID   lamiVUDine  300 mg Oral Daily   multivitamin with minerals  1 tablet Oral Q24H   oxybutynin  2.5 mg Oral BID   oxyCODONE  10 mg Oral QID   pantoprazole  40 mg Oral Daily   senna  2 tablet Oral QHS   zinc sulfate  220 mg Oral Daily    Continuous Infusions:  sodium chloride 50 mL/hr at 12/11/20 1717   magnesium sulfate bolus IVPB     sodium chloride irrigation       LOS: 55 days     Cristal Deer, MD [facility]  To reach me or the doctor on call, go to: [email or web address] Password [email or web address]  12/12/2020, 8:55 AM

## 2020-12-12 NOTE — Progress Notes (Signed)
Occupational Therapy Treatment Patient Details Name: Keith [name] MRN: [name] DOB: July 06, 1965 Today's Date: 12/12/2020    History of present illness 55 yo male admitted 10/17/20 after being sent to ED by his orthopedic provider with concerns for L heel ulcer.  He has failed conservative measure and presents with s/p L BKA on 10/27/20. s/p revision to L BKA on 11/17/20 & again on 12/02/20.  PMH: HIV, CKD 3, DM type 2, HTN, HLD, CAD, R BKA.   OT comments  Keith [name] is progerss, however still demonstrated limited insight into safety and his deficits which leads to self-limiting behaviors. Spent increased time this session educating pt about importance and purpose of limb protector. Pt eventually allowed therapists to dependently don limb protector while he was sitting in chair, however at the end of the session he was requesting to take it off stating "there's no need for it" and "the doc said I don't have to wear it." Overall pt was min A for lateral scoot transfer to maintain NWB. Pt continues to benefit from OT acutely. D/c plan remains appropriate.    Follow Up Recommendations  SNF    Equipment Recommendations  None recommended by OT    Recommendations for Other Services      Precautions / Restrictions Precautions Precautions: Fall Precaution Comments: B BKA's: R healed,  L BKA rev 8/27, RLE prosthesis, wound vac L leg Required Braces or Orthoses: Other Brace Other Brace: L limb protector to be worn when up and OOB - pt is non compliant with use. Restrictions Weight Bearing Restrictions: Yes LLE Weight Bearing: Non weight bearing Other Position/Activity Restrictions: NWB on left       Mobility Bed Mobility Overal bed mobility: Needs Assistance Bed Mobility: Supine to Sit     Supine to sit: HOB elevated;Modified independent (Device/Increase time)          Transfers Overall transfer level: Needs assistance Equipment used: None Transfers: Lateral/Scoot Transfers           Lateral/Scoot Transfers: Min assist General transfer comment: min A for maintaining NWB status, pt attempting to push through residual limb to laterally scoot to the chair despite education and verbal cues    Balance Overall balance assessment: Needs assistance Sitting-balance support: No upper extremity supported;Feet supported Sitting balance-Leahy Scale: Good                                     ADL either performed or assessed with clinical judgement   ADL Overall ADL's : Needs assistance/impaired                     Lower Body Dressing: Total assistance;Sitting/lateral leans Lower Body Dressing Details (indicate cue type and reason): total A for donning limb protector adn shinker this session             Functional mobility during ADLs: Minimal assistance (lateral scoot) General ADL Comments: pt declined need for any ADLs, he was adament on transferring via lateral scoot only and required max A +2 enouragement and education to don shrinker and limb protector     Vision   Vision Assessment?: No apparent visual deficits   Perception     Praxis      Cognition Arousal/Alertness: Awake/alert Behavior During Therapy: Flat affect;Agitated Overall Cognitive Status: Impaired/Different from baseline Area of Impairment: Safety/judgement;Following commands;Problem solving  Current Attention Level: Sustained   Following Commands: Follows one step commands consistently Safety/Judgement: Decreased awareness of safety;Decreased awareness of deficits Awareness: Emergent Problem Solving: Slow processing;Decreased initiation;Difficulty sequencing General Comments: Pt continues to be non-compliant with limb protector despite education and encouragement. Pt attempting to get OOB without proper chair set up and needed max verbal cue for safety and re-direction.        Exercises     Shoulder Instructions       General Comments VSS  on RA. Spent incrased time and effort this session education pt on limb protector, its main purpose, his correct wear schedule and encouraging pt to don it while sitting up in the chair. At the end of the session pt still saying "there's no need for it" and "the doc said I don't have to wear it."    Pertinent Vitals/ Pain       Pain Assessment: Faces Faces Pain Scale: Hurts a little bit Pain Location: L residual limb Pain Descriptors / Indicators: Grimacing Pain Intervention(s): Limited activity within patient's tolerance;Monitored during session;Repositioned  Home Living                                          Prior Functioning/Environment              Frequency  Min 2X/week        Progress Toward Goals  OT Goals(current goals can now be found in the care plan section)  Progress towards OT goals: Progressing toward goals  Acute Rehab OT Goals Patient Stated Goal: get out of the room OT Goal Formulation: With patient Potential to Achieve Goals: Good ADL Goals Pt Will Perform Lower Body Bathing: with mod assist;sit to/from stand Pt Will Perform Upper Body Dressing: with set-up;sitting Pt Will Perform Lower Body Dressing: sit to/from stand;with mod assist Pt Will Transfer to Toilet: with min assist;stand pivot transfer Pt Will Perform Toileting - Clothing Manipulation and hygiene: with mod assist;sit to/from stand  Plan Discharge plan remains appropriate;Frequency remains appropriate    Co-evaluation    PT/OT/SLP Co-Evaluation/Treatment: Yes Reason for Co-Treatment: Necessary to address cognition/behavior during functional activity;For patient/therapist safety;To address functional/ADL transfers   OT goals addressed during session: ADL's and self-care;Proper use of Adaptive equipment and DME      AM-PAC OT "6 Clicks" Daily Activity     Outcome Measure   Help from another person eating meals?: None Help from another person taking care of  personal grooming?: A Little Help from another person toileting, which includes using toliet, bedpan, or urinal?: A Lot Help from another person bathing (including washing, rinsing, drying)?: A Lot Help from another person to put on and taking off regular upper body clothing?: A Little Help from another person to put on and taking off regular lower body clothing?: A Lot 6 Click Score: 16    End of Session Equipment Utilized During Treatment:  (drop arm recliner, limb protector)  OT Visit Diagnosis: Other abnormalities of gait and mobility (R26.89);Muscle weakness (generalized) (M62.81);Pain Pain - Right/Left: Left Pain - part of body: Leg   Activity Tolerance Patient tolerated treatment well   Patient Left in chair;with call bell/phone within reach;with chair alarm set   Nurse Communication Mobility status;Precautions;Weight bearing status (pt would liek to be taken into the hallway)        Time: [id number] OT Time Calculation (min): 25 min  Charges:  OT General Charges $OT Visit: 1 Visit OT Treatments $Therapeutic Activity: 8-22 mins    Jeffery Gammell A Eiliyah Reh 12/12/2020, 5:19 PM

## 2020-12-12 NOTE — TOC Progression Note (Addendum)
Transition of Care [facility]) - Progression Note    Patient Details  Name: Keith [name] MRN: [name] Date of Birth: 1965-10-31  [facility]) CM/SW Contact  Milinda Antis, LCSWA Phone Number: 12/12/2020, 8:46 AM  Clinical Narrative:    08:46-  CSW received notification that the insurance company is requesting a peer to peer.  Attending has been notified of the request.  Insurance denied SNF.  Patient will need home with home health.  RNCM notified.     Expected Discharge Plan: Skilled Nursing Facility Barriers to Discharge: SNF Pending bed offer  Expected Discharge Plan and Services Expected Discharge Plan: [employer] In-house Referral: Clinical Social Work Discharge Planning Services: CM Consult Post Acute Care Choice: [place] arrangements for the past 2 months: Single Family Home                                       Social Determinants of Health (SDOH) Interventions    Readmission Risk Interventions Readmission Risk Prevention Plan 10/21/2020 11/13/2018 11/13/2018  Transportation Screening Complete - Complete  PCP or Specialist Appt within 3-5 Days - - -  HRI or [place] Work Consult for [place] - - -  Medication Review Press photographer) Complete - Complete  PCP or Specialist appointment within 3-5 days of discharge Complete Complete -  [place] or Home Care Consult Complete Complete -  SW Recovery Care/Counseling Consult Complete Complete -  Palliative Care Screening Not Applicable Not Applicable -  [place] Complete Not Applicable -  Some encounter information is confidential and restricted. Go to Review Flowsheets activity to see all data.  Some recent data might be hidden

## 2020-12-12 NOTE — Plan of Care (Signed)
?  Problem: Clinical Measurements: ?Goal: Ability to avoid or minimize complications of infection will improve ?Outcome: Progressing ?  ?Problem: Skin Integrity: ?Goal: Skin integrity will improve ?Outcome: Progressing ?  ?

## 2020-12-13 DIAGNOSIS — N179 Acute kidney failure, unspecified: Secondary | ICD-10-CM | POA: Diagnosis not present

## 2020-12-13 LAB — RENAL FUNCTION PANEL
Albumin: 2.3 g/dL — ABNORMAL LOW (ref 3.5–5.0)
Anion gap: 8 (ref 5–15)
BUN: 32 mg/dL — ABNORMAL HIGH (ref 6–20)
CO2: 22 mmol/L (ref 22–32)
Calcium: 8.9 mg/dL (ref 8.9–10.3)
Chloride: 104 mmol/L (ref 98–111)
Creatinine, Ser: 2.11 mg/dL — ABNORMAL HIGH (ref 0.61–1.24)
GFR, Estimated: 36 mL/min — ABNORMAL LOW (ref >60)
Glucose, Bld: 132 mg/dL — ABNORMAL HIGH (ref 70–99)
Phosphorus: 4.8 mg/dL — ABNORMAL HIGH (ref 2.5–4.6)
Potassium: 5.3 mmol/L — ABNORMAL HIGH (ref 3.5–5.1)
Sodium: 134 mmol/L — ABNORMAL LOW (ref 135–145)

## 2020-12-13 LAB — CBC WITH DIFFERENTIAL/PLATELET
Abs Immature Granulocytes: 0.06 10*3/uL (ref 0.00–0.07)
Basophils Absolute: 0 10*3/uL (ref 0.0–0.1)
Basophils Relative: 0 %
Eosinophils Absolute: 0.3 10*3/uL (ref 0.0–0.5)
Eosinophils Relative: 4 %
HCT: 24.7 % — ABNORMAL LOW (ref 39.0–52.0)
Hemoglobin: 7.8 g/dL — ABNORMAL LOW (ref 13.0–17.0)
Immature Granulocytes: 1 %
Lymphocytes Relative: 17 %
Lymphs Abs: 1.6 10*3/uL (ref 0.7–4.0)
MCH: 29.4 pg (ref 26.0–34.0)
MCHC: 31.6 g/dL (ref 30.0–36.0)
MCV: 93.2 fL (ref 80.0–100.0)
Monocytes Absolute: 0.6 10*3/uL (ref 0.1–1.0)
Monocytes Relative: 7 %
Neutro Abs: 6.7 10*3/uL (ref 1.7–7.7)
Neutrophils Relative %: 71 %
Platelets: 360 10*3/uL (ref 150–400)
RBC: 2.65 MIL/uL — ABNORMAL LOW (ref 4.22–5.81)
RDW: 14.6 % (ref 11.5–15.5)
WBC: 9.3 10*3/uL (ref 4.0–10.5)
nRBC: 0.4 % — ABNORMAL HIGH (ref 0.0–0.2)

## 2020-12-13 LAB — T-HELPER CELLS (CD4) COUNT (NOT AT ARMC)
CD4 % Helper T Cell: 8 % — ABNORMAL LOW (ref 33–65)
CD4 T Cell Abs: 103 /uL — ABNORMAL LOW (ref 400–1790)

## 2020-12-13 LAB — MAGNESIUM: Magnesium: 1.9 mg/dL (ref 1.7–2.4)

## 2020-12-13 MED ORDER — GABAPENTIN 300 MG PO CAPS
300.0000 mg | ORAL_CAPSULE | Freq: Three times a day (TID) | ORAL | Status: DC
  Administered 2020-12-13 – 2020-12-27 (×42): 300 mg via ORAL
  Filled 2020-12-13 (×44): qty 1

## 2020-12-13 NOTE — Plan of Care (Signed)
  Problem: Clinical Measurements: Goal: Ability to avoid or minimize complications of infection will improve Outcome: Progressing   Problem: Skin Integrity: Goal: Skin integrity will improve Outcome: Progressing   Problem: Clinical Measurements: Goal: Ability to maintain clinical measurements within normal limits will improve Outcome: Progressing

## 2020-12-13 NOTE — Progress Notes (Signed)
Patient is status post below-knee amputation revision for the second time.  Wound VAC was removed today.  He has well apposed wound edges without any evidence of dehiscence.  Staples are in place.  Clean dressing was applied.  Will need 1 week follow-up in our office after discharge

## 2020-12-13 NOTE — Progress Notes (Signed)
11 Days Post-Op Subjective: Denies pain, no nausea or emesis. Urine clear yellow off clamp.  Objective: Vital signs in last 24 hours: Temp:  [98.4 F (36.9 C)] 98.4 F (36.9 C) (09/07 0742) Pulse Rate:  [73-81] 76 (09/07 0742) Resp:  [16-18] 17 (09/07 0742) BP: (114-143)/(79-96) 143/96 (09/07 0742) SpO2:  [97 %-99 %] 99 % (09/07 0742)  Intake/Output from previous day: 09/06 0701 - 09/07 0700 In: 2600 [I.V.:2000] Out: 4300 [Urine:4300] Intake/Output this shift: Total I/O In: 120 [P.O.:120] Out: 600 [Urine:600]  Physical Exam:  General: Alert and oriented CV: RRR Lungs: Clear Abdomen: Soft. ND, NT  Lab Results: Recent Labs    12/11/20 0150 12/12/20 0256 12/13/20 0123  HGB 8.2* 8.1* 7.8*  HCT 26.2* 25.0* 24.7*   BMET Recent Labs    12/12/20 0256 12/13/20 0123  NA 137 134*  K 5.0 5.3*  CL 106 104  CO2 24 22  GLUCOSE 107* 132*  BUN 32* 32*  CREATININE 2.12* 2.11*  CALCIUM 9.1 8.9     Studies/Results: US RENAL  Result Date: 12/11/2020 CLINICAL DATA:  Gross hematuria, history HIV/aids, coronary artery disease, stage IV chronic kidney disease, type II diabetes mellitus, hypertension EXAM: RENAL / URINARY TRACT ULTRASOUND COMPLETE COMPARISON:  11/01/2020 FINDINGS: Right Kidney: Renal measurements: 11.7 x 5.5 x 5.4 cm = volume: 183 mL. Normal cortical thickness. Slightly increased cortical echogenicity. No mass, hydronephrosis, or shadowing calcification. Left Kidney: Renal measurements: 11.3 x 5.0 x 5.8 cm = volume: 172 mL. Normal cortical thickness. Slightly increased cortical echogenicity. No mass, hydronephrosis, or shadowing calcification. Bladder: Foley catheter decompresses urinary bladder. Bladder wall appears thickened but this may be an artifact related to underdistention. Other: N/A IMPRESSION: Medical renal disease changes of both kidneys. No evidence of renal mass, hydronephrosis, or calculi. Electronically Signed   By: Lavonia Dana M.D.   On: 12/11/2020  16:37    Assessment/Plan: Gross hematuria: S/p clot evacuation, fulguration of prostate on 11/09/2020. Restarted however resolving. RBUS 12/11/2020 with no clot in bladder. PMH of HIV, CKD III, DM II, HTN, CAD, and bilateral BKA   -Urine is clear yellow. [place] for void trial. -Continue finasteride '5mg'$  daily -I messaged schedulers to arrange followup   LOS: 56 days   Matt R. Jovahn Breit MD 12/13/2020, 12:24 PM [facility]  Pager: [phone]

## 2020-12-13 NOTE — TOC Progression Note (Signed)
Transition of Care [facility]) - Initial/Assessment Note    Patient Details  Name: Keith [name] MRN: [id number] Date of Birth: 1965-08-22  Transition of [facility]) CM/SW Contact:    Milinda Antis, [place] Phone Number: 12/13/2020, 4:46 PM  Clinical Narrative:                 CSW  received notification from the attending that the patient is now willing to go to [facility] after previously stating numerous times that he would rather go home than [facility].  CSW received the appeal information for the insurance company and called [employer] to appeal the denial.  The decision will be made within 72 hours.  Appeal number [phone].  CSW called [place] to ask if they could accept the patient with Medicaid if [name] upholds their denial.  There was no answer.  CSW left a VM requesting a returned call.     17:01- CSW received a returned call from [place].  They are unable to accept the patient through Medicaid.     Expected Discharge Plan: [place] (vs SNF) Barriers to Discharge: Continued Medical Work up   Patient Goals and CMS Choice Patient states their goals for this hospitalization and ongoing recovery are:: snf CMS Medicare.gov Compare Post Acute Care list provided to:: Patient Choice offered to / list presented to : Patient  Expected Discharge Plan and Services Expected Discharge Plan: [place] (vs SNF) In-house Referral: Clinical Social Work Discharge Planning Services: CM Consult Post Acute Care Choice: [place] arrangements for the past 2 months: [place]                                      Prior Living Arrangements/Services Living arrangements for the past 2 months: Single Family Home Lives with:: Parents Patient language and need for interpreter reviewed:: Yes        Need for Family Participation in Patient Care: Yes (Comment) Care giver support system in place?: Yes  (comment) Current home services: DME (has walker) Criminal Activity/Legal Involvement Pertinent to Current Situation/Hospitalization: No - Comment as needed  Activities of Daily Living Home Assistive Devices/Equipment: Environmental consultant (specify type), Shower chair with back, Grab bars in shower, Hand-held shower hose ADL Screening (condition at time of admission) Patient's cognitive ability adequate to safely complete daily activities?: Yes Is the patient deaf or have difficulty hearing?: No Does the patient have difficulty seeing, even when wearing glasses/contacts?: No Does the patient have difficulty concentrating, remembering, or making decisions?: No Patient able to express need for assistance with ADLs?: Yes Does the patient have difficulty dressing or bathing?: Yes Independently performs ADLs?: No Does the patient have difficulty walking or climbing stairs?: Yes Weakness of Legs: Both Weakness of Arms/Hands: None  Permission Sought/Granted Permission sought to share information with : Facility Art therapist granted to share information with : Yes, Verbal Permission Granted     Permission granted to share info w AGENCY: SNFs        Emotional Assessment Appearance:: Appears stated age Attitude/Demeanor/Rapport: Engaged Affect (typically observed): Accepting, Appropriate   Alcohol / Substance Use: Not Applicable Psych Involvement: No (comment)  Admission diagnosis:  Cellulitis [L03.90] Hyperglycemia [R73.9] Renal insufficiency [N28.9] Cellulitis of left foot [L03.116] Normochromic normocytic anemia [D64.9] Elevated blood pressure reading with diagnosis of hypertension [I10] Diabetic ulcer of left heel  associated with diabetes mellitus of other type, unspecified ulcer stage ([place]) [id number], L97.429] Patient Active Problem List   Diagnosis Date Noted   Dehiscence of amputation stump ([place])    Hx of BKA, left ([place])    Cellulitis of left foot    Acute  osteomyelitis of left calcaneus ([place])    Ulcer of heel due to diabetes mellitus ([place])    Normochromic normocytic anemia    Cellulitis 10/18/2020   Atypical chest pain [id number]   Acute metabolic encephalopathy 123XX123   Hyperglycemia due to diabetes mellitus ([place]) 08/29/2020   Hyperglycemia 08/29/2020   Acute cerebrovascular accident (CVA) due to ischemia ([place]) 04/28/2020   Type 2 diabetes mellitus with hyperlipidemia ([place])    PAD (peripheral artery disease) ([place])    Hypoglycemia 04/25/2020   Acute ST elevation myocardial infarction (STEMI) due to occlusion of circumflex coronary artery ([place]) 03/22/2020   Uncontrolled type 2 diabetes mellitus with hyperglycemia ([place]) 01/14/2019   Elevated glucose 01/14/2019   Hx of BKA, right ([place]) 01/14/2019   Pressure injury of skin 11/11/2018   STEMI (ST elevation myocardial infarction) ([place]) 11/10/2018   CKD (chronic kidney disease) stage 4, GFR 15-29 ml/min ([place]) 11/10/2018   Amputation stump infection ([place]) 10/28/2018   Type II diabetes mellitus with renal manifestations ([place]) 08/07/2018   Uncontrolled type 2 diabetes mellitus with hyperosmolar nonketotic hyperglycemia ([place]) 08/07/2018   Non-pressure chronic ulcer of right calf limited to breakdown of skin ([place]) 07/06/2018   Type 2 diabetes mellitus without complication, without long-term current use of insulin ([place]) 06/15/2018   Chronic low back pain without sciatica 06/15/2018   Idiopathic chronic venous hypertension of left lower extremity with ulcer and inflammation (HCC)    Venous stasis ulcer of left calf limited to breakdown of skin without varicose veins ([place]) 04/23/2018   Acquired absence of right leg below knee ([place]) 06/13/2017   Acute kidney injury superimposed on CKD ([place]) 03/15/2017   Diabetic polyneuropathy associated with type 2 diabetes mellitus ([place]) 01/23/2017   AKI (acute kidney injury) ([place]) 09/28/2016   Nausea vomiting and diarrhea 09/28/2016   Onychomycosis of multiple  toenails with type 2 diabetes mellitus ([place]) 08/29/2015   MRSA carrier 04/20/2014   Penile wart 02/22/2014   HIV disease ([place])    Insulin-requiring or dependent type II diabetes mellitus ([place]) 02/04/2014   Dental anomaly 11/20/2012   Arthritis of right knee 02/23/2012   Hyperlipidemia 11/10/2011   Hyponatremia 11/10/2011   Chronic pain 08/07/2011   Meralgia paraesthetica 04/23/2011   ERECTILE DYSFUNCTION 08/22/2008   Elevated blood pressure reading with diagnosis of hypertension 05/19/2008   PCP:  [name], NP Pharmacy[facility]   [id number] [place] [address]Phone: [phone]: [phone] [id number] [address]: [phone]: [phone] [address]: [phone]: [phone]   Social Determinants of Health (SDOH) Interventions    Readmission Risk Interventions Readmission Risk Prevention Plan 10/21/2020 11/13/2018 11/13/2018  Transportation Screening Complete - Complete  PCP or Specialist Appt within 3-5 Days - - -  [place] or [place] - -  Medication Review (RN Care Manager) Complete - Complete  PCP or  Specialist appointment within 3-5 days of discharge Complete Complete -  [place] or Home Care Consult Complete Complete -  SW Recovery Care/Counseling Consult Complete Complete -  Palliative Care Screening Not Applicable Not Applicable -  [place] Complete Not Applicable -  Some encounter information is confidential and restricted. Go to Review Flowsheets activity to see all data.  Some recent data might be hidden

## 2020-12-13 NOTE — Progress Notes (Signed)
PROGRESS NOTE    Keith [name]  [id number] DOB: 1966/03/02 DOA: 10/17/2020 [name], NP   Chief Complaint  Patient presents with   Foot Ulcer   Brief Narrative: 55 yo with PMH T2DM, HTN, HLD, CAD, CKD 3B, HIV, s/p R BKA who initially presented on 7/13 with L heel ulceration.  He was seen by [name] and underwent transtibial amputation with application of prevena wound vac on 7/22.  Hospitalization was complicated by AKI and renal was consulted.  AKI was thought to be 2/2 ATN 2/2 hypotension.  He had tunneled HD cath placed in preparation for HD on 7/28.  Hospitalization c/b gross hematuria on 8/4, he underwent cystoscopy, clot evacuation, and fulguration of prostate on 8/4.  Tunneled HD catheter removed on 8/8.  8/10 was noted to have wound dehiscence and bleeding from his L BKA and he had revision of his BKA on 8/12.  Given his bleeding on DAPT with PCI 03/22/20, case was discussed with cardiology who recommended continuing aspirin and holding brilinta until bleeding stops and H/H stable (8/14, cards note).  He went back for revision of L BKA again on 8/27.  Hospitalization complicated recurrent hematuria on 8/22 and again on 9/5.  He had CBI and plan for void trial on 9/7.    Assessment & Plan:   Principal Problem:   AKI (acute kidney injury) ([place]) Active Problems:   Elevated blood pressure reading with diagnosis of hypertension   HIV disease ([place])   Uncontrolled type 2 diabetes mellitus with hyperglycemia (HCC)   Cellulitis   Ulcer of heel due to diabetes mellitus (HCC)   Normochromic normocytic anemia   Cellulitis of left foot   Acute osteomyelitis of left calcaneus (HCC)   Dehiscence of amputation stump (HCC)   Hx of BKA, left (HCC)  Gross Hematuria S/p cystoscopy, clot evacuation, fulguration of prostate on 11/09/2020 Recommending trial of void, finasteride Outpatient follow up  AKI on CKD IIIb Baseline creatinine about 2.5 Currently 2.11 Negative w/u for  hydro/acute GN Tunneled dialysis catheter has been removed Renal has now signed off - plan for outpatient nephology follow up (see 8/7 renal note)  Left Heel Osteomyelitis  Recurrent Wound Dehiscence and Bleeding 7/22 transtibial amputation Revision on 8/12 and 8/27 Wound vac removed today per ortho Needs 1 week follow up with orthopedics after discharge  Acute blood loss anemia Continue to monitor, transfuse as indicated  Uncontrolled T2 DM A1c 13.14 Aug 2020 (question accuracy of 8/24 A1c of 5.9 with blood loss anemia) Though doing well on jardiance alone Continue to monitor  CAD s/p Stenting 03/2020 Brilinta discontinued based on discussion with cardiology on 8/14 If pt stabilizes, plan to resume DAPT with aspirin/brilinta when ok per surgery  HLD Vascepa, lipitor  HIV Continue antiretrovirals  DVT prophylaxis: SCD Code Status: full  Family Communication: none at bedside Disposition:   Status is: Inpatient  Remains inpatient appropriate because:Inpatient level of care appropriate due to severity of illness  Dispo:  Patient From:    Planned Disposition: Home with Health Care Svc  Medically stable for discharge:           Consultants:  Ortho urology  Procedures:  See prior notes and above  Antimicrobials:  Anti-infectives (From admission, onward)    Start     Dose/Rate Route Frequency Ordered Stop   12/02/20 1230  ceFAZolin (ANCEF) IVPB 2g/100 mL premix        2 g 200 mL/hr over 30 Minutes Intravenous Every  8 hours 12/02/20 1134 12/02/20 2245   12/02/20 0600  ceFAZolin (ANCEF) IVPB 3g/100 mL premix        3 g 200 mL/hr over 30 Minutes Intravenous On call to O.R. 12/01/20 1909 12/02/20 0950   12/01/20 0600  ceFAZolin (ANCEF) IVPB 2g/100 mL premix  Status:  Discontinued        2 g 200 mL/hr over 30 Minutes Intravenous On call to O.R. 11/30/20 1817 11/30/20 1819   12/01/20 0600  ceFAZolin (ANCEF) IVPB 3g/100 mL premix        3 g 200 mL/hr over 30  Minutes Intravenous On call to O.R. 11/30/20 1820 12/02/20 0559   11/18/20 0100  ceFAZolin (ANCEF) IVPB 2g/100 mL premix        2 g 200 mL/hr over 30 Minutes Intravenous Every 8 hours 11/17/20 2045 11/18/20 1038   11/17/20 0600  ceFAZolin (ANCEF) IVPB 3g/100 mL premix        3 g 200 mL/hr over 30 Minutes Intravenous On call to O.R. 11/16/20 1941 11/17/20 1901   11/11/20 1200  vancomycin (VANCOREADY) IVPB 500 mg/100 mL  Status:  Discontinued        500 mg 100 mL/hr over 60 Minutes Intravenous Every T-Th-Sa (Hemodialysis) 11/09/20 1414 11/11/20 1328   11/10/20 0000  Dolutegravir-lamiVUDine (DOVATO) 50-300 MG TABS        1 tablet Oral Daily 11/10/20 1048     11/10/20 0000  doravirine (PIFELTRO) 100 MG TABS tablet        100 mg Oral Daily 11/10/20 1048     11/08/20 1000  dolutegravir (TIVICAY) tablet 50 mg        50 mg Oral Daily 11/07/20 1451     11/08/20 1000  lamiVUDine (EPIVIR) tablet 300 mg        300 mg Oral Daily 11/07/20 1451     11/08/20 1000  doravirine (PIFELTRO) tablet 100 mg        100 mg Oral Daily 11/07/20 1451     11/02/20 1130  ceFAZolin (ANCEF) IVPB 2g/100 mL premix        2 g 200 mL/hr over 30 Minutes Intravenous To Radiology 11/02/20 1042 11/02/20 1704   10/27/20 1245  ceFAZolin (ANCEF) IVPB 3g/100 mL premix        3 g 200 mL/hr over 30 Minutes Intravenous On call to O.R. 10/27/20 0755 10/27/20 1608   10/20/20 1415  linezolid (ZYVOX) tablet 600 mg  Status:  Discontinued        600 mg Oral Every 12 hours 10/20/20 1326 10/28/20 1115   10/19/20 1445  metroNIDAZOLE (FLAGYL) tablet 500 mg  Status:  Discontinued        500 mg Oral Every 12 hours 10/19/20 1347 10/28/20 1115   10/18/20 1600  ceFEPIme (MAXIPIME) 2 g in sodium chloride 0.9 % 100 mL IVPB  Status:  Discontinued        2 g 200 mL/hr over 30 Minutes Intravenous Every 12 hours 10/18/20 0414 10/28/20 1112   10/18/20 1000  dapsone tablet 100 mg        100 mg Oral Daily 10/18/20 0425     10/18/20 1000   bictegravir-emtricitabine-tenofovir AF (BIKTARVY) 50-200-25 MG per tablet 1 tablet  Status:  Discontinued        1 tablet Oral Daily 10/18/20 0610 11/07/20 1451   10/18/20 0415  metroNIDAZOLE (FLAGYL) IVPB 500 mg  Status:  Discontinued        500 mg 100 mL/hr over 60 Minutes  Intravenous Every 8 hours 10/18/20 0404 10/19/20 1347   10/18/20 0415  ceFEPIme (MAXIPIME) 2 g in sodium chloride 0.9 % 100 mL IVPB        2 g 200 mL/hr over 30 Minutes Intravenous  Once 10/18/20 0414 10/18/20 0500   10/18/20 0345  vancomycin (VANCOREADY) IVPB 2000 mg/400 mL        2,000 mg 200 mL/hr over 120 Minutes Intravenous  Once 10/18/20 0335 10/18/20 0608          Subjective: C/o LLE pain  Objective: Vitals:   12/12/20 2143 12/13/20 0742 12/13/20 1448 12/13/20 1819  BP: 114/86 (!) 143/96 (!) 145/96 120/86  Pulse: 81 76 72 91  Resp: '16 17 17 16  '$ Temp: 98.4 F (36.9 C) 98.4 F (36.9 C) 99 F (37.2 C) 98.5 F (36.9 C)  TempSrc: Oral Oral Oral Oral  SpO2: 99% 99% 97% 96%  Weight:      Height:        Intake/Output Summary (Last 24 hours) at 12/13/2020 2045 Last data filed at 12/13/2020 1500 Gross per 24 hour  Intake 2120 ml  Output 3000 ml  Net -880 ml   Filed Weights   11/20/20 0618 11/23/20 0618 11/23/20 2144  Weight: 117.3 kg 121.4 kg 121.4 kg    Examination:  General exam: Appears calm and comfortable  Respiratory system: Clear to auscultation. Respiratory effort normal. Cardiovascular system: RRR Gastrointestinal system: Abdomen is nondistended, soft and nontender Urine clear in foley Central nervous system: Alert and oriented. No focal neurological deficits. Extremities: bilateral BKA    Data Reviewed: I have personally reviewed following labs and imaging studies  CBC: Recent Labs  Lab 12/09/20 0122 12/10/20 0112 12/11/20 0150 12/12/20 0256 12/13/20 0123  WBC 11.1* 9.8 10.3 10.4 9.3  NEUTROABS 8.2* 7.1 7.3 7.8* 6.7  HGB 8.6* 8.6* 8.2* 8.1* 7.8*  HCT 26.6* 27.4* 26.2*  25.0* 24.7*  MCV 92.7 93.8 93.2 91.9 93.2  PLT 420* 420* 404* 381 [id number]    Basic Metabolic Panel: Recent Labs  Lab 12/09/20 0122 12/10/20 0112 12/11/20 0150 12/11/20 1354 12/12/20 0256 12/13/20 0123  NA 135 135 135 137 137 134*  K 5.1 4.6 4.8 4.9 5.0 5.3*  CL 100 101 103 103 106 104  CO2 '26 24 24 24 24 22  '$ GLUCOSE 142* 164* 129* 152* 107* 132*  BUN 25* 29* 29* 31* 32* 32*  CREATININE 2.09* 2.02* 2.04* 2.33* 2.12* 2.11*  CALCIUM 9.1 8.9 9.3 9.1 9.1 8.9  MG 2.1 2.2 2.2  --  2.2 1.9  PHOS 4.8* 5.0* 4.8*  --  5.0* 4.8*    GFR: Estimated Creatinine Clearance: 51.1 mL/min (Keith [name]) (by C-G formula based on SCr of 2.11 mg/dL (H)).  Liver Function Tests: Recent Labs  Lab 12/09/20 0122 12/10/20 0112 12/11/20 0150 12/12/20 0256 12/13/20 0123  ALBUMIN 2.3* 2.3* 2.4* 2.3* 2.3*    CBG: Recent Labs  Lab 12/10/20 2134 12/11/20 0620 12/11/20 1130 12/11/20 1712 12/11/20 2052  GLUCAP 139* 126* 114* 140* 105*     No results found for this or any previous visit (from the past 240 hour(s)).       Radiology Studies: No results found.      Scheduled Meds:  alfuzosin  10 mg Oral Q breakfast   allopurinol  100 mg Oral Daily   vitamin C  1,000 mg Oral Daily   aspirin EC  81 mg Oral Daily   atorvastatin  80 mg Oral Daily   Chlorhexidine Gluconate Cloth  6 each Topical Daily   dapsone  100 mg Oral Daily   docusate sodium  100 mg Oral Daily   dolutegravir  50 mg Oral Daily   doravirine  100 mg Oral Daily   empagliflozin  10 mg Oral Daily   escitalopram  20 mg Oral Daily   finasteride  5 mg Oral Daily   gabapentin  100 mg Oral TID   icosapent Ethyl  2 g Oral BID   lamiVUDine  300 mg Oral Daily   multivitamin with minerals  1 tablet Oral Q24H   oxybutynin  2.5 mg Oral BID   oxyCODONE  10 mg Oral QID   pantoprazole  40 mg Oral Daily   senna  2 tablet Oral QHS   zinc sulfate  220 mg Oral Daily   Continuous Infusions:  sodium chloride 50 mL/hr at 12/13/20 0830    magnesium sulfate bolus IVPB     sodium chloride irrigation       LOS: 56 days    Time spent: over 30 min    Fayrene Helper, MD [facility]   To contact the attending provider between 7A-7P or the covering provider during after hours 7P-7A, please log into the web site [email or web address] and access using universal [place] password for that web site. If you do not have the password, please call the hospital operator.  12/13/2020, 8:45 PM

## 2020-12-14 DIAGNOSIS — N179 Acute kidney failure, unspecified: Secondary | ICD-10-CM | POA: Diagnosis not present

## 2020-12-14 LAB — COMPREHENSIVE METABOLIC PANEL
ALT: 30 U/L (ref 0–44)
AST: 21 U/L (ref 15–41)
Albumin: 2.4 g/dL — ABNORMAL LOW (ref 3.5–5.0)
Alkaline Phosphatase: 116 U/L (ref 38–126)
Anion gap: 7 (ref 5–15)
BUN: 26 mg/dL — ABNORMAL HIGH (ref 6–20)
CO2: 24 mmol/L (ref 22–32)
Calcium: 9.2 mg/dL (ref 8.9–10.3)
Chloride: 106 mmol/L (ref 98–111)
Creatinine, Ser: 1.92 mg/dL — ABNORMAL HIGH (ref 0.61–1.24)
GFR, Estimated: 40 mL/min — ABNORMAL LOW (ref >60)
Glucose, Bld: 113 mg/dL — ABNORMAL HIGH (ref 70–99)
Potassium: 5.1 mmol/L (ref 3.5–5.1)
Sodium: 137 mmol/L (ref 135–145)
Total Bilirubin: 0.5 mg/dL (ref 0.3–1.2)
Total Protein: 7 g/dL (ref 6.5–8.1)

## 2020-12-14 LAB — CBC WITH DIFFERENTIAL/PLATELET
Abs Immature Granulocytes: 0.02 10*3/uL (ref 0.00–0.07)
Basophils Absolute: 0.1 10*3/uL (ref 0.0–0.1)
Basophils Relative: 1 %
Eosinophils Absolute: 0.3 10*3/uL (ref 0.0–0.5)
Eosinophils Relative: 4 %
HCT: 25.3 % — ABNORMAL LOW (ref 39.0–52.0)
Hemoglobin: 7.9 g/dL — ABNORMAL LOW (ref 13.0–17.0)
Immature Granulocytes: 0 %
Lymphocytes Relative: 21 %
Lymphs Abs: 1.7 10*3/uL (ref 0.7–4.0)
MCH: 28.7 pg (ref 26.0–34.0)
MCHC: 31.2 g/dL (ref 30.0–36.0)
MCV: 92 fL (ref 80.0–100.0)
Monocytes Absolute: 0.5 10*3/uL (ref 0.1–1.0)
Monocytes Relative: 7 %
Neutro Abs: 5.2 10*3/uL (ref 1.7–7.7)
Neutrophils Relative %: 67 %
Platelets: 392 10*3/uL (ref 150–400)
RBC: 2.75 MIL/uL — ABNORMAL LOW (ref 4.22–5.81)
RDW: 14.5 % (ref 11.5–15.5)
WBC: 7.7 10*3/uL (ref 4.0–10.5)
nRBC: 0 % (ref 0.0–0.2)

## 2020-12-14 LAB — RENAL FUNCTION PANEL
Albumin: 2.4 g/dL — ABNORMAL LOW (ref 3.5–5.0)
Anion gap: 9 (ref 5–15)
BUN: 26 mg/dL — ABNORMAL HIGH (ref 6–20)
CO2: 23 mmol/L (ref 22–32)
Calcium: 9.2 mg/dL (ref 8.9–10.3)
Chloride: 105 mmol/L (ref 98–111)
Creatinine, Ser: 1.98 mg/dL — ABNORMAL HIGH (ref 0.61–1.24)
GFR, Estimated: 39 mL/min — ABNORMAL LOW (ref >60)
Glucose, Bld: 112 mg/dL — ABNORMAL HIGH (ref 70–99)
Phosphorus: 4.7 mg/dL — ABNORMAL HIGH (ref 2.5–4.6)
Potassium: 5.1 mmol/L (ref 3.5–5.1)
Sodium: 137 mmol/L (ref 135–145)

## 2020-12-14 LAB — PHOSPHORUS: Phosphorus: 4.7 mg/dL — ABNORMAL HIGH (ref 2.5–4.6)

## 2020-12-14 LAB — IRON AND TIBC
Iron: 33 ug/dL — ABNORMAL LOW (ref 45–182)
Saturation Ratios: 16 % — ABNORMAL LOW (ref 17.9–39.5)
TIBC: 200 ug/dL — ABNORMAL LOW (ref 250–450)
UIBC: 167 ug/dL

## 2020-12-14 LAB — FOLATE: Folate: 7.3 ng/mL (ref >5.9)

## 2020-12-14 LAB — VITAMIN B12: Vitamin B-12: 1006 pg/mL — ABNORMAL HIGH (ref 180–914)

## 2020-12-14 LAB — FERRITIN: Ferritin: 640 ng/mL — ABNORMAL HIGH (ref 24–336)

## 2020-12-14 LAB — MAGNESIUM: Magnesium: 2 mg/dL (ref 1.7–2.4)

## 2020-12-14 NOTE — Progress Notes (Signed)
PROGRESS NOTE    Keith [name]  [id number] DOB: Oct 23, 1965 DOA: 10/17/2020 [name], NP   Chief Complaint  Patient presents with   Foot Ulcer   Brief Narrative: 55 yo with PMH T2DM, HTN, HLD, CAD, CKD 3B, HIV, s/p R BKA who initially presented on 7/13 with L heel ulceration.  He was seen by [name] and underwent transtibial amputation with application of prevena wound vac on 7/22.  Hospitalization was complicated by AKI and renal was consulted.  AKI was thought to be 2/2 ATN 2/2 hypotension.  He had tunneled HD cath placed in preparation for HD on 7/28.  Hospitalization c/b gross hematuria on 8/4, he underwent cystoscopy, clot evacuation, and fulguration of prostate on 8/4.  Tunneled HD catheter removed on 8/8.  8/10 was noted to have wound dehiscence and bleeding from his L BKA and he had revision of his BKA on 8/12.  Given his bleeding on DAPT with PCI 03/22/20, case was discussed with cardiology who recommended continuing aspirin and holding brilinta until bleeding stops and H/H stable (8/14, cards note).  He went back for revision of L BKA again on 8/27.  Hospitalization complicated recurrent hematuria on 8/22 and again on 9/5.  He had CBI and plan for void trial on 9/7.    Assessment & Plan:   Principal Problem:   AKI (acute kidney injury) ([place]) Active Problems:   Elevated blood pressure reading with diagnosis of hypertension   HIV disease ([place])   Uncontrolled type 2 diabetes mellitus with hyperglycemia (HCC)   Cellulitis   Ulcer of heel due to diabetes mellitus (HCC)   Normochromic normocytic anemia   Cellulitis of left foot   Acute osteomyelitis of left calcaneus (HCC)   Dehiscence of amputation stump (HCC)   Hx of BKA, left (HCC)  Gross Hematuria S/p cystoscopy, clot evacuation, fulguration of prostate on 11/09/2020 finasteride Outpatient follow up >700 cc on bladder scan today -> replace foley catheter  AKI on CKD IIIb Baseline creatinine about  2.5 Currently 1.92 Negative w/u for hydro/acute GN Tunneled dialysis catheter has been removed Renal has now signed off - plan for outpatient nephology follow up (see 8/7 renal note)  Left Heel Osteomyelitis  Recurrent Wound Dehiscence and Bleeding 7/22 transtibial amputation Revision on 8/12 and 8/27 Wound vac removed today per ortho Needs 1 week follow up with orthopedics after discharge  Acute blood loss anemia Continue to monitor, transfuse as indicated Iron def, aocd  Uncontrolled T2 DM A1c 13.14 Aug 2020 (question accuracy of 8/24 A1c of 5.9 with blood loss anemia) Though doing well on jardiance alone Continue to monitor  CAD s/p Stenting 03/2020 Brilinta discontinued based on discussion with cardiology on 8/14 If pt stabilizes, plan to resume DAPT with aspirin/brilinta when ok per surgery  HLD Vascepa, lipitor  HIV Continue antiretrovirals  DVT prophylaxis: SCD Code Status: full  Family Communication: none at bedside Disposition:   Status is: Inpatient  Remains inpatient appropriate because:Inpatient level of care appropriate due to severity of illness  Dispo:  Patient From:    Planned Disposition: Home with Health Care Svc  Medically stable for discharge:           Consultants:  Ortho urology  Procedures:  See prior notes and above  Antimicrobials:  Anti-infectives (From admission, onward)    Start     Dose/Rate Route Frequency Ordered Stop   12/02/20 1230  ceFAZolin (ANCEF) IVPB 2g/100 mL premix  2 g 200 mL/hr over 30 Minutes Intravenous Every 8 hours 12/02/20 1134 12/02/20 2245   12/02/20 0600  ceFAZolin (ANCEF) IVPB 3g/100 mL premix        3 g 200 mL/hr over 30 Minutes Intravenous On call to O.R. 12/01/20 1909 12/02/20 0950   12/01/20 0600  ceFAZolin (ANCEF) IVPB 2g/100 mL premix  Status:  Discontinued        2 g 200 mL/hr over 30 Minutes Intravenous On call to O.R. 11/30/20 1817 11/30/20 1819   12/01/20 0600  ceFAZolin (ANCEF)  IVPB 3g/100 mL premix        3 g 200 mL/hr over 30 Minutes Intravenous On call to O.R. 11/30/20 1820 12/02/20 0559   11/18/20 0100  ceFAZolin (ANCEF) IVPB 2g/100 mL premix        2 g 200 mL/hr over 30 Minutes Intravenous Every 8 hours 11/17/20 2045 11/18/20 1038   11/17/20 0600  ceFAZolin (ANCEF) IVPB 3g/100 mL premix        3 g 200 mL/hr over 30 Minutes Intravenous On call to O.R. 11/16/20 1941 11/17/20 1901   11/11/20 1200  vancomycin (VANCOREADY) IVPB 500 mg/100 mL  Status:  Discontinued        500 mg 100 mL/hr over 60 Minutes Intravenous Every T-Th-Sa (Hemodialysis) 11/09/20 1414 11/11/20 1328   11/10/20 0000  Dolutegravir-lamiVUDine (DOVATO) 50-300 MG TABS        1 tablet Oral Daily 11/10/20 1048     11/10/20 0000  doravirine (PIFELTRO) 100 MG TABS tablet        100 mg Oral Daily 11/10/20 1048     11/08/20 1000  dolutegravir (TIVICAY) tablet 50 mg        50 mg Oral Daily 11/07/20 1451     11/08/20 1000  lamiVUDine (EPIVIR) tablet 300 mg        300 mg Oral Daily 11/07/20 1451     11/08/20 1000  doravirine (PIFELTRO) tablet 100 mg        100 mg Oral Daily 11/07/20 1451     11/02/20 1130  ceFAZolin (ANCEF) IVPB 2g/100 mL premix        2 g 200 mL/hr over 30 Minutes Intravenous To Radiology 11/02/20 1042 11/02/20 1704   10/27/20 1245  ceFAZolin (ANCEF) IVPB 3g/100 mL premix        3 g 200 mL/hr over 30 Minutes Intravenous On call to O.R. 10/27/20 0755 10/27/20 1608   10/20/20 1415  linezolid (ZYVOX) tablet 600 mg  Status:  Discontinued        600 mg Oral Every 12 hours 10/20/20 1326 10/28/20 1115   10/19/20 1445  metroNIDAZOLE (FLAGYL) tablet 500 mg  Status:  Discontinued        500 mg Oral Every 12 hours 10/19/20 1347 10/28/20 1115   10/18/20 1600  ceFEPIme (MAXIPIME) 2 g in sodium chloride 0.9 % 100 mL IVPB  Status:  Discontinued        2 g 200 mL/hr over 30 Minutes Intravenous Every 12 hours 10/18/20 0414 10/28/20 1112   10/18/20 1000  dapsone tablet 100 mg        100 mg Oral  Daily 10/18/20 0425     10/18/20 1000  bictegravir-emtricitabine-tenofovir AF (BIKTARVY) 50-200-25 MG per tablet 1 tablet  Status:  Discontinued        1 tablet Oral Daily 10/18/20 0610 11/07/20 1451   10/18/20 0415  metroNIDAZOLE (FLAGYL) IVPB 500 mg  Status:  Discontinued  500 mg 100 mL/hr over 60 Minutes Intravenous Every 8 hours 10/18/20 0404 10/19/20 1347   10/18/20 0415  ceFEPIme (MAXIPIME) 2 g in sodium chloride 0.9 % 100 mL IVPB        2 g 200 mL/hr over 30 Minutes Intravenous  Once 10/18/20 0414 10/18/20 0500   10/18/20 0345  vancomycin (VANCOREADY) IVPB 2000 mg/400 mL        2,000 mg 200 mL/hr over 120 Minutes Intravenous  Once 10/18/20 0335 10/18/20 0608          Subjective: Sleeping, wakes appropriately, no complaints  Objective: Vitals:   12/13/20 2133 12/14/20 0739 12/14/20 1308 12/14/20 1952  BP: (!) 143/96 (!) 145/85 (!) 133/92 125/79  Pulse: 71 70 80 84  Resp: '18 17 17 18  '$ Temp: 98.4 F (36.9 C) 98.8 F (37.1 C) 98.4 F (36.9 C) 98.8 F (37.1 C)  TempSrc: Oral Oral Oral Oral  SpO2: 98% 96% 98% 95%  Weight:      Height:        Intake/Output Summary (Last 24 hours) at 12/14/2020 2006 Last data filed at 12/14/2020 0500 Gross per 24 hour  Intake --  Output 2000 ml  Net -2000 ml   Filed Weights   11/20/20 0618 11/23/20 0618 11/23/20 2144  Weight: 117.3 kg 121.4 kg 121.4 kg    Examination:  General: No acute distress. Cardiovascular: RRR Lungs: unlabored Neurological: sleepy, but awakens appropriately Extremities: bilateral BKA     Data Reviewed: I have personally reviewed following labs and imaging studies  CBC: Recent Labs  Lab 12/10/20 0112 12/11/20 0150 12/12/20 0256 12/13/20 0123 12/14/20 0222  WBC 9.8 10.3 10.4 9.3 7.7  NEUTROABS 7.1 7.3 7.8* 6.7 5.2  HGB 8.6* 8.2* 8.1* 7.8* 7.9*  HCT 27.4* 26.2* 25.0* 24.7* 25.3*  MCV 93.8 93.2 91.9 93.2 92.0  PLT 420* 404* 381 360 0000000    Basic Metabolic Panel: Recent Labs  Lab  12/10/20 0112 12/11/20 0150 12/11/20 1354 12/12/20 0256 12/13/20 0123 12/14/20 0222  NA 135 135 137 137 134* 137  137  K 4.6 4.8 4.9 5.0 5.3* 5.1  5.1  CL 101 103 103 106 104 106  105  CO2 '24 24 24 24 22 24  23  '$ GLUCOSE 164* 129* 152* 107* 132* 113*  112*  BUN 29* 29* 31* 32* 32* 26*  26*  CREATININE 2.02* 2.04* 2.33* 2.12* 2.11* 1.92*  1.98*  CALCIUM 8.9 9.3 9.1 9.1 8.9 9.2  9.2  MG 2.2 2.2  --  2.2 1.9 2.0  PHOS 5.0* 4.8*  --  5.0* 4.8* 4.7*  4.7*    GFR: Estimated Creatinine Clearance: 54.4 mL/min (Kylieann Eagles) (by C-G formula based on SCr of 1.98 mg/dL (H)).  Liver Function Tests: Recent Labs  Lab 12/10/20 0112 12/11/20 0150 12/12/20 0256 12/13/20 0123 12/14/20 0222  AST  --   --   --   --  21  ALT  --   --   --   --  30  ALKPHOS  --   --   --   --  116  BILITOT  --   --   --   --  0.5  PROT  --   --   --   --  7.0  ALBUMIN 2.3* 2.4* 2.3* 2.3* 2.4*  2.4*    CBG: Recent Labs  Lab 12/10/20 2134 12/11/20 0620 12/11/20 1130 12/11/20 1712 12/11/20 2052  GLUCAP 139* 126* 114* 140* 105*     No results  found for this or any previous visit (from the past 240 hour(s)).       Radiology Studies: No results found.      Scheduled Meds:  alfuzosin  10 mg Oral Q breakfast   allopurinol  100 mg Oral Daily   vitamin C  1,000 mg Oral Daily   aspirin EC  81 mg Oral Daily   atorvastatin  80 mg Oral Daily   Chlorhexidine Gluconate Cloth  6 each Topical Daily   dapsone  100 mg Oral Daily   docusate sodium  100 mg Oral Daily   dolutegravir  50 mg Oral Daily   doravirine  100 mg Oral Daily   empagliflozin  10 mg Oral Daily   escitalopram  20 mg Oral Daily   finasteride  5 mg Oral Daily   gabapentin  300 mg Oral TID   icosapent Ethyl  2 g Oral BID   lamiVUDine  300 mg Oral Daily   multivitamin with minerals  1 tablet Oral Q24H   oxybutynin  2.5 mg Oral BID   oxyCODONE  10 mg Oral QID   pantoprazole  40 mg Oral Daily   senna  2 tablet Oral QHS   zinc  sulfate  220 mg Oral Daily   Continuous Infusions:  sodium chloride 50 mL/hr at 12/13/20 0830   magnesium sulfate bolus IVPB     sodium chloride irrigation       LOS: 57 days    Time spent: over 30 min    Fayrene Helper, MD [facility]   To contact the attending provider between 7A-7P or the covering provider during after hours 7P-7A, please log into the web site [email or web address] and access using universal [place] password for that web site. If you do not have the password, please call the hospital operator.  12/14/2020, 8:06 PM

## 2020-12-14 NOTE — TOC Progression Note (Signed)
Transition of Care [facility]) - Initial/Assessment Note    Patient Details  Name: Keith [name] MRN: [name] Date of Birth: 08-Sep-1965  Transition of Care [facility]) CM/SW Contact:    Milinda Antis, LCSWA Phone Number: 12/14/2020, 12:29 PM  Clinical Narrative:                 CSW faxed clinicals for SNF appeal to the insurance company.  Expected Discharge Plan: [place] (vs SNF) Barriers to Discharge: Continued Medical Work up   Patient Goals and CMS Choice Patient states their goals for this hospitalization and ongoing recovery are:: snf CMS Medicare.gov Compare Post Acute Care list provided to:: Patient Choice offered to / list presented to : Patient  Expected Discharge Plan and Services Expected Discharge Plan: [place] (vs SNF) In-house Referral: Clinical Social Work Discharge Planning Services: CM Consult Post Acute Care Choice: [place] Living arrangements for the past 2 months: [place]: RN, Disease Management, PT, OT, Nurse's Aide, Social Work [employer] Agency: [place] Date [place]: 12/14/20 Time [place]: [phone] Representative spoke with at [place]: [place] Arrangements/Services Living arrangements for the past 2 months: [place] with:: Parents Patient language and need for interpreter reviewed:: Yes        Need for Family Participation in Patient Care: Yes (Comment) Care giver support system in place?: Yes (comment) Current home services: DME (has walker) Criminal Activity/Legal Involvement Pertinent to Current Situation/Hospitalization: No - Comment as needed  Activities of Daily Living Home Assistive Devices/Equipment: Environmental consultant (specify type), Shower chair with back, Grab bars in shower, Hand-held shower hose ADL Screening (condition at time of admission) Patient's cognitive ability adequate to safely  complete daily activities?: Yes Is the patient deaf or have difficulty hearing?: No Does the patient have difficulty seeing, even when wearing glasses/contacts?: No Does the patient have difficulty concentrating, remembering, or making decisions?: No Patient able to express need for assistance with ADLs?: Yes Does the patient have difficulty dressing or bathing?: Yes Independently performs ADLs?: No Does the patient have difficulty walking or climbing stairs?: Yes Weakness of Legs: Both Weakness of Arms/Hands: None  Permission Sought/Granted Permission sought to share information with : Facility Art therapist granted to share information with : Yes, Verbal Permission Granted     Permission granted to share info w AGENCY: SNFs        Emotional Assessment Appearance:: Appears stated age Attitude/Demeanor/Rapport: Engaged Affect (typically observed): Accepting, Appropriate   Alcohol / Substance Use: Not Applicable Psych Involvement: No (comment)  Admission diagnosis:  Cellulitis [L03.90] Hyperglycemia [R73.9] Renal insufficiency [N28.9] Cellulitis of left foot [L03.116] Normochromic normocytic anemia [D64.9] Elevated blood pressure reading with diagnosis of hypertension [I10] Diabetic ulcer of left heel associated with diabetes mellitus of other type, unspecified ulcer stage ([place]) [id number], L97.429] Patient Active Problem List   Diagnosis Date Noted   Dehiscence of amputation stump (HCC)    Hx of BKA, left ([place])    Cellulitis of left foot    Acute osteomyelitis of left calcaneus (HCC)    Ulcer of heel due to diabetes mellitus (HCC)    Normochromic normocytic anemia    Cellulitis 10/18/2020   Atypical chest pain 123XX123   Acute metabolic encephalopathy 123XX123   Hyperglycemia  due to diabetes mellitus ([place]) 08/29/2020   Hyperglycemia 08/29/2020   Acute cerebrovascular accident (CVA) due to ischemia ([place]) 04/28/2020   Type 2 diabetes mellitus with  hyperlipidemia ([place])    PAD (peripheral artery disease) ([place])    Hypoglycemia 04/25/2020   Acute ST elevation myocardial infarction (STEMI) due to occlusion of circumflex coronary artery ([place]) 03/22/2020   Uncontrolled type 2 diabetes mellitus with hyperglycemia ([place]) 01/14/2019   Elevated glucose 01/14/2019   Hx of BKA, right ([place]) 01/14/2019   Pressure injury of skin 11/11/2018   STEMI (ST elevation myocardial infarction) ([place]) 11/10/2018   CKD (chronic kidney disease) stage 4, GFR 15-29 ml/min ([place]) 11/10/2018   Amputation stump infection ([place]) 10/28/2018   Type II diabetes mellitus with renal manifestations ([place]) 08/07/2018   Uncontrolled type 2 diabetes mellitus with hyperosmolar nonketotic hyperglycemia ([place]) 08/07/2018   Non-pressure chronic ulcer of right calf limited to breakdown of skin ([place]) 07/06/2018   Type 2 diabetes mellitus without complication, without long-term current use of insulin ([place]) 06/15/2018   Chronic low back pain without sciatica 06/15/2018   Idiopathic chronic venous hypertension of left lower extremity with ulcer and inflammation (HCC)    Venous stasis ulcer of left calf limited to breakdown of skin without varicose veins ([place]) 04/23/2018   Acquired absence of right leg below knee ([place]) 06/13/2017   Acute kidney injury superimposed on CKD ([place]) 03/15/2017   Diabetic polyneuropathy associated with type 2 diabetes mellitus ([place]) 01/23/2017   AKI (acute kidney injury) ([place]) 09/28/2016   Nausea vomiting and diarrhea 09/28/2016   Onychomycosis of multiple toenails with type 2 diabetes mellitus ([place]) 08/29/2015   MRSA carrier 04/20/2014   Penile wart 02/22/2014   HIV disease ([place])    Insulin-requiring or dependent type II diabetes mellitus ([place]) 02/04/2014   Dental anomaly 11/20/2012   Arthritis of right knee 02/23/2012   Hyperlipidemia 11/10/2011   Hyponatremia 11/10/2011   Chronic pain 08/07/2011   Meralgia paraesthetica 04/23/2011   ERECTILE DYSFUNCTION  08/22/2008   Elevated blood pressure reading with diagnosis of hypertension 05/19/2008   PCP:  [name], NP Pharmacy[facility]   [id number] [place], [id number]: [phone]: [phone] [id number] [address]: [phone]: [phone] [address]: [phone]: [phone]   Social Determinants of Health (SDOH) Interventions    Readmission Risk Interventions Readmission Risk Prevention Plan 10/21/2020 11/13/2018 11/13/2018  Transportation Screening Complete - Complete  PCP or Specialist Appt within 3-5 Days - - -  HRI or [place] - -  Medication Review (RPress photographerComplete - Complete  PCP or Specialist appointment within 3-5 days of discharge Complete Complete -  [facility]is[facility] Consult Complete Complete -  SW Recovery Care/Counseling Consult Complete Complete -  Palliative Care Screening Not Applicable Not Applicable -  Sk[place]omplete Not Applicable -  Some encounter information is confidential and restricted. Go to Review Flowsheets activity to see all data.  Some recent data might be hidden

## 2020-12-14 NOTE — Progress Notes (Signed)
Physical Therapy Treatment Patient Details Name: Keith [name] MRN: [name] DOB: 1965-04-25 Today's Date: 12/14/2020    History of Present Illness 55 yo male admitted 10/17/20 after being sent to ED by his orthopedic provider with concerns for L heel ulcer.  He has failed conservative measure and presents with s/p L BKA on 10/27/20. s/p revision to L BKA on 11/17/20 & again on 12/02/20.  PMH: HIV, CKD 3, DM type 2, HTN, HLD, CAD, R BKA.    PT Comments    Pt was seen for both education on use of limb protector and need for ROM to L knee.  Pt is tending to sit in bed with L knee flexed, and would benefit from wearing the protector more often to encourage him to straighten L knee.  He is able to agree with the goals of PT regarding getting home and being a candidate for a prosthetic, but the return on the discussion in therapy session is semi compliant.  Follow along with him to increase his active mobility beyond just getting to chair, to stand more and begin some active self ROM to L knee for progression toward the goal of prosthetic fitting.     Follow Up Recommendations  SNF     Equipment Recommendations  Wheelchair (measurements PT);Wheelchair cushion (measurements PT)    Recommendations for Other Services OT consult     Precautions / Restrictions Precautions Precautions: Fall Precaution Comments: B BKA's: R healed,  L BKA rev 8/27, RLE prosthesis, wound vac L leg Required Braces or Orthoses: Other Brace Other Brace: L limb protector to be worn when up and OOB - pt is non compliant with use. Restrictions Weight Bearing Restrictions: Yes RLE Weight Bearing: Weight bearing as tolerated LLE Weight Bearing: Non weight bearing Other Position/Activity Restrictions: NWB on left    Mobility  Bed Mobility Overal bed mobility: Needs Assistance Bed Mobility: Supine to Sit Rolling: Supervision   Supine to sit: Supervision     General bed mobility comments: pt is using elevation of  HOB    Transfers Overall transfer level: Needs assistance Equipment used: None Transfers: Lateral/Scoot Transfers          Lateral/Scoot Transfers: Min guard General transfer comment: min guard to avoid his tendency to be too close to front of chair  Ambulation/Gait             General Gait Details: declines application of prosthesis   Stairs             Wheelchair Mobility    Modified Rankin (Stroke Patients Only)       Balance Overall balance assessment: Needs assistance Sitting-balance support: Bilateral upper extremity supported Sitting balance-Leahy Scale: Good                                      Cognition Arousal/Alertness: Awake/alert Behavior During Therapy: Flat affect Overall Cognitive Status: Impaired/Different from baseline Area of Impairment: Problem solving;Awareness;Safety/judgement                   Current Attention Level: Selective Memory: Decreased short-term memory Following Commands: Follows one step commands with increased time Safety/Judgement: Decreased awareness of deficits Awareness: Intellectual Problem Solving: Slow processing;Requires verbal cues General Comments: pt agreed to application of limb protector at beginning of session, distracted by needing to void now that his catheter is removed      Exercises General Exercises -  Lower Extremity Quad Sets: Other (comment) (stretches on L knee)    General Comments General comments (skin integrity, edema, etc.): pt is in the limb protector with help, able to move well with it to scoot into the chair      Pertinent Vitals/Pain Pain Assessment: Faces Faces Pain Scale: Hurts a little bit Pain Location: L residual limb Pain Descriptors / Indicators: Guarding Pain Intervention(s): Repositioned    Home Living                      Prior Function            PT Goals (current goals can now be found in the care plan section) Acute Rehab  PT Goals Patient Stated Goal: go home    Frequency    Min 3X/week      PT Plan Current plan remains appropriate    Co-evaluation              AM-PAC PT "6 Clicks" Mobility   Outcome Measure  Help needed turning from your back to your side while in a flat bed without using bedrails?: A Little Help needed moving from lying on your back to sitting on the side of a flat bed without using bedrails?: A Little Help needed moving to and from a bed to a chair (including a wheelchair)?: A Little Help needed standing up from a chair using your arms (e.g., wheelchair or bedside chair)?: A Lot Help needed to walk in hospital room?: Total Help needed climbing 3-5 steps with a railing? : Total 6 Click Score: 13    End of Session Equipment Utilized During Treatment: Other (comment) (limb protector) Activity Tolerance: Patient tolerated treatment well Patient left: in chair;with call bell/phone within reach;with chair alarm set Nurse Communication: Mobility status PT Visit Diagnosis: Other abnormalities of gait and mobility (R26.89);Muscle weakness (generalized) (M62.81);Difficulty in walking, not elsewhere classified (R26.2) Pain - Right/Left: Left Pain - part of body: Leg     Time: [id number] PT Time Calculation (min) (ACUTE ONLY): 25 min  Charges:  $Therapeutic Exercise: 8-22 mins $Therapeutic Activity: 23-37 mins                  Ramond Dial 12/14/2020, 4:34 PM  Mee Hives, PT MS Acute Rehab Dept. Number: [name]

## 2020-12-14 NOTE — Plan of Care (Addendum)
Foley inserted due to retention per Dr. Abel Presto order. Patient urinated a small amount but still had >600 in bladder after.   Problem: Clinical Measurements: Goal: Ability to maintain clinical measurements within normal limits will improve Outcome: Progressing   Problem: Activity: Goal: Risk for activity intolerance will decrease Outcome: Progressing   Problem: Pain Managment: Goal: General experience of comfort will improve Outcome: Progressing   Problem: Safety: Goal: Ability to remain free from injury will improve Outcome: Progressing   Problem: Skin Integrity: Goal: Risk for impaired skin integrity will decrease Outcome: Progressing

## 2020-12-15 DIAGNOSIS — N179 Acute kidney failure, unspecified: Secondary | ICD-10-CM | POA: Diagnosis not present

## 2020-12-15 LAB — CBC WITH DIFFERENTIAL/PLATELET
Abs Immature Granulocytes: 0.05 10*3/uL (ref 0.00–0.07)
Basophils Absolute: 0.1 10*3/uL (ref 0.0–0.1)
Basophils Relative: 0 %
Eosinophils Absolute: 0.2 10*3/uL (ref 0.0–0.5)
Eosinophils Relative: 2 %
HCT: 25.9 % — ABNORMAL LOW (ref 39.0–52.0)
Hemoglobin: 8.1 g/dL — ABNORMAL LOW (ref 13.0–17.0)
Immature Granulocytes: 0 %
Lymphocytes Relative: 15 %
Lymphs Abs: 1.7 10*3/uL (ref 0.7–4.0)
MCH: 29.1 pg (ref 26.0–34.0)
MCHC: 31.3 g/dL (ref 30.0–36.0)
MCV: 93.2 fL (ref 80.0–100.0)
Monocytes Absolute: 0.6 10*3/uL (ref 0.1–1.0)
Monocytes Relative: 5 %
Neutro Abs: 9.1 10*3/uL — ABNORMAL HIGH (ref 1.7–7.7)
Neutrophils Relative %: 78 %
Platelets: 385 10*3/uL (ref 150–400)
RBC: 2.78 MIL/uL — ABNORMAL LOW (ref 4.22–5.81)
RDW: 14.5 % (ref 11.5–15.5)
WBC: 11.7 10*3/uL — ABNORMAL HIGH (ref 4.0–10.5)
nRBC: 0 % (ref 0.0–0.2)

## 2020-12-15 LAB — GLUCOSE, CAPILLARY: Glucose-Capillary: 147 mg/dL — ABNORMAL HIGH (ref 70–99)

## 2020-12-15 LAB — BASIC METABOLIC PANEL
Anion gap: 8 (ref 5–15)
BUN: 31 mg/dL — ABNORMAL HIGH (ref 6–20)
CO2: 24 mmol/L (ref 22–32)
Calcium: 9.1 mg/dL (ref 8.9–10.3)
Chloride: 104 mmol/L (ref 98–111)
Creatinine, Ser: 2.5 mg/dL — ABNORMAL HIGH (ref 0.61–1.24)
GFR, Estimated: 29 mL/min — ABNORMAL LOW (ref >60)
Glucose, Bld: 105 mg/dL — ABNORMAL HIGH (ref 70–99)
Potassium: 5 mmol/L (ref 3.5–5.1)
Sodium: 136 mmol/L (ref 135–145)

## 2020-12-15 LAB — RENAL FUNCTION PANEL
Albumin: 2.5 g/dL — ABNORMAL LOW (ref 3.5–5.0)
Anion gap: 7 (ref 5–15)
BUN: 27 mg/dL — ABNORMAL HIGH (ref 6–20)
CO2: 24 mmol/L (ref 22–32)
Calcium: 9.2 mg/dL (ref 8.9–10.3)
Chloride: 106 mmol/L (ref 98–111)
Creatinine, Ser: 2.18 mg/dL — ABNORMAL HIGH (ref 0.61–1.24)
GFR, Estimated: 35 mL/min — ABNORMAL LOW (ref >60)
Glucose, Bld: 110 mg/dL — ABNORMAL HIGH (ref 70–99)
Phosphorus: 5 mg/dL — ABNORMAL HIGH (ref 2.5–4.6)
Potassium: 5.4 mmol/L — ABNORMAL HIGH (ref 3.5–5.1)
Sodium: 137 mmol/L (ref 135–145)

## 2020-12-15 LAB — MAGNESIUM: Magnesium: 2.2 mg/dL (ref 1.7–2.4)

## 2020-12-15 MED ORDER — POLYETHYLENE GLYCOL 3350 17 G PO PACK
17.0000 g | PACK | Freq: Two times a day (BID) | ORAL | Status: DC
  Administered 2020-12-15 – 2020-12-22 (×7): 17 g via ORAL
  Filled 2020-12-15 (×20): qty 1

## 2020-12-15 NOTE — Progress Notes (Signed)
Occupational Therapy Treatment Patient Details Name: Keith [name] MRN: [name] DOB: [birth year] Today's Date: 12/15/2020    History of present illness 55 yo male admitted 10/17/20 after being sent to ED by his orthopedic provider with concerns for L heel ulcer.  He has failed conservative measure and presents with s/p L BKA on 10/27/20. s/p revision to L BKA on 11/17/20 & again on 12/02/20.  PMH: HIV, CKD 3, DM type 2, HTN, HLD, CAD, R BKA.   OT comments  Pt struggled to day with decreased activity tolerance and poor overall strength. Question a component of low mood, pt with high risk factors for depression (messaged attending MD about this). Originally planned to lateral scoot into w/c and to get out of hospital room for a bit during OT session. Pt declined donning RLE prosthetic and declined use of transfer board. Today pt struggled, ultimately unsuccessfully, with powering over wheel of w/c from elevated EOB height. Pt struggled with problem solving through this, declined OT cues to switch to an anterior-posterior transfer. ultimately OT had to intervene to stop pt's unsafe attempt to scoot out too far forward to try to navigate around the wheel frame. At this point pt fatigued into supine, vitals assessed and stable. assisted back into bed. Of note, pt was found to be incontinent of bowel (loose) upon arrival of OT, another episode of this while sitting EOB. D/c plan remains appropriate.   Follow Up Recommendations  SNF    Equipment Recommendations  None recommended by OT    Recommendations for Other Services  Psych Consult? (concern for depression)    Precautions / Restrictions Precautions Precautions: Fall Precaution Comments: B BKA's: R healed,  L BKA rev 8/27, RLE prosthesis, wound vac L leg Required Braces or Orthoses: Other Brace Other Brace: L limb protector to be worn when up and OOB - pt is non compliant with use. Restrictions Weight Bearing Restrictions: Yes RLE Weight  Bearing: Weight bearing as tolerated LLE Weight Bearing: Non weight bearing       Mobility Bed Mobility Overal bed mobility: Needs Assistance Bed Mobility: Rolling;Sidelying to Sit Rolling: Mod assist Sidelying to sit: Mod assist       General bed mobility comments: Mod A to roll fully onto side. mod A for trunk elevation today. Sat EOB in a lateral lean position against HOB/rail. Decreased activity tolerance and weakness limiting session.    Transfers Overall transfer level: Needs assistance Equipment used: None Transfers: Lateral/Scoot Transfers           General transfer comment: originally planned to lateral scoot into w/c. Pt declined donning RLE prosthetic and declined use of transfer board. Today pt struggled, ultimately unsuccessfully with powering over wheel of w/c from elevated EOB height. Pt struggled with problem solving through this, declined OT cues to switch to an anterior-posterior transfer. ultimately OT had to intervene to stop pt's unsafe attempt to scoot out too far forward to try to navigate around the wheel frame. At this point pt fatigued into supine, vitals assessed and stable. assisted back into bed.    Balance Overall balance assessment: Needs assistance Sitting-balance support: Bilateral upper extremity supported Sitting balance-Leahy Scale: Fair Sitting balance - Comments: lateral leaning onto elevated HOB/rail cues to come up upright position, struggled to maintain.       Standing balance comment: pt declined                           ADL  either performed or assessed with clinical judgement   ADL Overall ADL's : Needs assistance/impaired                             Toileting- Clothing Manipulation and Hygiene: Total assistance;Bed level;Moderate assistance Toileting - Clothing Manipulation Details (indicate cue type and reason): mod A to roll completely onto each side, total A for pericare after bowel movement        General ADL Comments: Pt completed bed mobility, pericare, then sat EOB with BUE support for several minutes. Pt presents today with decreased activity tolerance and generalized weakness. Pt presents with low mood and internally focused moreso today than previous time this OT saw pt.     Vision       Perception     Praxis      Cognition Arousal/Alertness: Awake/alert Behavior During Therapy: Flat affect Overall Cognitive Status: Impaired/Different from baseline Area of Impairment: Problem solving;Awareness;Safety/judgement                   Current Attention Level: Selective Memory: Decreased short-term memory Following Commands: Follows one step commands with increased time Safety/Judgement: Decreased awareness of safety;Decreased awareness of deficits Awareness: Intellectual Problem Solving: Slow processing;Decreased initiation;Difficulty sequencing;Requires verbal cues;Requires tactile cues General Comments: Decreased safety awareness/insight into deficits. Multimodal cues to stop pt from scooting too far forward when attempting lateral transfers to w/c. Pt unable to manuever up and over wheel and his solution was to come to front edge. This may have worked previously but pt did not have the UB strength today and struggled with problem solving and decreased sfety awareness.        Exercises     Shoulder Instructions       General Comments      Pertinent Vitals/ Pain       Pain Assessment: Faces Faces Pain Scale: Hurts a little bit Pain Location: L residual limb Pain Descriptors / Indicators: Guarding Pain Intervention(s): Monitored during session;Repositioned  Home Living                                          Prior Functioning/Environment              Frequency  Min 2X/week        Progress Toward Goals  OT Goals(current goals can now be found in the care plan section)  Progress towards OT goals: Progressing toward goals  (slowed progress)  Acute Rehab OT Goals Patient Stated Goal: go home OT Goal Formulation: With patient Time For Goal Achievement: 12/29/20 Potential to Achieve Goals: Fair ADL Goals Pt Will Perform Lower Body Bathing: with mod assist;sitting/lateral leans;sit to/from stand Pt Will Perform Lower Body Dressing: with mod assist;sitting/lateral leans;sit to/from stand Pt Will Transfer to Toilet: anterior/posterior transfer;with modified independence;with transfer board Pt Will Perform Toileting - Clothing Manipulation and hygiene: with mod assist;sitting/lateral leans  Plan Discharge plan remains appropriate;Frequency remains appropriate    Co-evaluation                 AM-PAC OT "6 Clicks" Daily Activity     Outcome Measure   Help from another person eating meals?: None Help from another person taking care of personal grooming?: A Little Help from another person toileting, which includes using toliet, bedpan, or urinal?: A Lot Help from another person bathing (including  washing, rinsing, drying)?: A Lot Help from another person to put on and taking off regular upper body clothing?: A Little Help from another person to put on and taking off regular lower body clothing?: A Lot 6 Click Score: 16    End of Session Equipment Utilized During Treatment: Other (comment) (w/c, limb protector removed due to being soiled)  OT Visit Diagnosis: Other abnormalities of gait and mobility (R26.89);Muscle weakness (generalized) (M62.81);Pain Pain - Right/Left: Left Pain - part of body: Leg   Activity Tolerance Patient limited by fatigue;Other (comment) (pt c/o upset stomach, stools were loose)   Patient Left in bed;with call bell/phone within reach;with bed alarm set   Nurse Communication Other (comment);Mobility status (NT assisted with vitals and positioning pt back in bed. discussed with RN, MD, and NT about pt's low mood compared to previous sessions with this OT, concerned for high risk  of depression)        Time: [id number] OT Time Calculation (min): 51 min  Charges: OT General Charges $OT Visit: 1 Visit OT Treatments $Self Care/Home Management : 8-22 mins $Therapeutic Activity: 23-37 mins  Tyrone Schimke, OT Acute Rehabilitation Services Pager: [phone] Office: [phone]    Hortencia Pilar 12/15/2020, 2:13 PM

## 2020-12-15 NOTE — Progress Notes (Signed)
PROGRESS NOTE    Keith [name]  [id number] DOB: 1965/06/18 DOA: 10/17/2020 [name], NP   Chief Complaint  Patient presents with   Foot Ulcer   Brief Narrative: 55 yo with PMH T2DM, HTN, HLD, CAD, CKD 3B, HIV, s/p R BKA who initially presented on 7/13 with L heel ulceration.  He was seen by [name] and underwent transtibial amputation with application of prevena wound vac on 7/22.  Hospitalization was complicated by AKI and renal was consulted.  AKI was thought to be 2/2 ATN 2/2 hypotension.  He had tunneled HD cath placed in preparation for HD on 7/28.  Hospitalization c/b gross hematuria on 8/4, he underwent cystoscopy, clot evacuation, and fulguration of prostate on 8/4.  Tunneled HD catheter removed on 8/8.  8/10 was noted to have wound dehiscence and bleeding from his L BKA and he had revision of his BKA on 8/12.  Given his bleeding on DAPT with PCI 03/22/20, case was discussed with cardiology who recommended continuing aspirin and holding brilinta until bleeding stops and H/H stable (8/14, cards note).  He went back for revision of L BKA again on 8/27.  Hospitalization complicated recurrent hematuria on 8/22 and again on 9/5.  He had CBI and plan for void trial on 9/7.    Assessment & Plan:   Principal Problem:   AKI (acute kidney injury) ([place]) Active Problems:   Elevated blood pressure reading with diagnosis of hypertension   HIV disease ([place])   Uncontrolled type 2 diabetes mellitus with hyperglycemia ([place])   Cellulitis   Ulcer of heel due to diabetes mellitus (HCC)   Normochromic normocytic anemia   Cellulitis of left foot   Acute osteomyelitis of left calcaneus (HCC)   Dehiscence of amputation stump (HCC)   Hx of BKA, left (HCC)  Gross Hematuria S/p cystoscopy, clot evacuation, fulguration of prostate on 11/09/2020 finasteride Outpatient follow up >700 cc on bladder scan 9/8 -> replace foley catheter 9/8 -- will need outpatient follow up with urology  AKI  on CKD IIIb  Hyperkalemia Baseline creatinine about 2.5 Fluctuating, K Alyviah Crandle bit up today, repeat Negative w/u for hydro/acute GN Tunneled dialysis catheter has been removed Renal has now signed off - plan for outpatient nephology follow up (see 8/7 renal note)  Left Heel Osteomyelitis  Recurrent Wound Dehiscence and Bleeding 7/22 transtibial amputation Revision on 8/12 and 8/27 Wound vac removed today per ortho Needs 1 week follow up with orthopedics after discharge  Acute blood loss anemia Continue to monitor, transfuse as indicated Iron def, aocd  Uncontrolled T2 DM A1c 13.14 Aug 2020 (question accuracy of 8/24 A1c of 5.9 with blood loss anemia) Though doing well on jardiance alone Continue to monitor  CAD s/p Stenting 03/2020 Brilinta discontinued based on discussion with cardiology on 8/14 If pt stabilizes, plan to resume DAPT with aspirin/brilinta when ok per surgery  HLD Vascepa, lipitor  HIV Continue antiretrovirals  Depressed Mood He denies SI.  Declined any medications at this time.  Notes he was talking with Doy Taaffe family member who saw his legs and they got upset, which got him down.  Will continue to monitor.  DVT prophylaxis: SCD Code Status: full  Family Communication: none at bedside Disposition:   Status is: Inpatient  Remains inpatient appropriate because:Inpatient level of care appropriate due to severity of illness  Dispo:  Patient From:    Planned Disposition: Home with Health Care Svc  Medically stable for discharge:  Consultants:  Ortho urology  Procedures:  See prior notes and above  Antimicrobials:  Anti-infectives (From admission, onward)    Start     Dose/Rate Route Frequency Ordered Stop   12/02/20 1230  ceFAZolin (ANCEF) IVPB 2g/100 mL premix        2 g 200 mL/hr over 30 Minutes Intravenous Every 8 hours 12/02/20 1134 12/02/20 2245   12/02/20 0600  ceFAZolin (ANCEF) IVPB 3g/100 mL premix        3 g 200 mL/hr over 30  Minutes Intravenous On call to O.R. 12/01/20 1909 12/02/20 0950   12/01/20 0600  ceFAZolin (ANCEF) IVPB 2g/100 mL premix  Status:  Discontinued        2 g 200 mL/hr over 30 Minutes Intravenous On call to O.R. 11/30/20 1817 11/30/20 1819   12/01/20 0600  ceFAZolin (ANCEF) IVPB 3g/100 mL premix        3 g 200 mL/hr over 30 Minutes Intravenous On call to O.R. 11/30/20 1820 12/02/20 0559   11/18/20 0100  ceFAZolin (ANCEF) IVPB 2g/100 mL premix        2 g 200 mL/hr over 30 Minutes Intravenous Every 8 hours 11/17/20 2045 11/18/20 1038   11/17/20 0600  ceFAZolin (ANCEF) IVPB 3g/100 mL premix        3 g 200 mL/hr over 30 Minutes Intravenous On call to O.R. 11/16/20 1941 11/17/20 1901   11/11/20 1200  vancomycin (VANCOREADY) IVPB 500 mg/100 mL  Status:  Discontinued        500 mg 100 mL/hr over 60 Minutes Intravenous Every T-Th-Sa (Hemodialysis) 11/09/20 1414 11/11/20 1328   11/10/20 0000  Dolutegravir-lamiVUDine (DOVATO) 50-300 MG TABS        1 tablet Oral Daily 11/10/20 1048     11/10/20 0000  doravirine (PIFELTRO) 100 MG TABS tablet        100 mg Oral Daily 11/10/20 1048     11/08/20 1000  dolutegravir (TIVICAY) tablet 50 mg        50 mg Oral Daily 11/07/20 1451     11/08/20 1000  lamiVUDine (EPIVIR) tablet 300 mg        300 mg Oral Daily 11/07/20 1451     11/08/20 1000  doravirine (PIFELTRO) tablet 100 mg        100 mg Oral Daily 11/07/20 1451     11/02/20 1130  ceFAZolin (ANCEF) IVPB 2g/100 mL premix        2 g 200 mL/hr over 30 Minutes Intravenous To Radiology 11/02/20 1042 11/02/20 1704   10/27/20 1245  ceFAZolin (ANCEF) IVPB 3g/100 mL premix        3 g 200 mL/hr over 30 Minutes Intravenous On call to O.R. 10/27/20 0755 10/27/20 1608   10/20/20 1415  linezolid (ZYVOX) tablet 600 mg  Status:  Discontinued        600 mg Oral Every 12 hours 10/20/20 1326 10/28/20 1115   10/19/20 1445  metroNIDAZOLE (FLAGYL) tablet 500 mg  Status:  Discontinued        500 mg Oral Every 12 hours 10/19/20  1347 10/28/20 1115   10/18/20 1600  ceFEPIme (MAXIPIME) 2 g in sodium chloride 0.9 % 100 mL IVPB  Status:  Discontinued        2 g 200 mL/hr over 30 Minutes Intravenous Every 12 hours 10/18/20 0414 10/28/20 1112   10/18/20 1000  dapsone tablet 100 mg        100 mg Oral Daily 10/18/20 0425  10/18/20 1000  bictegravir-emtricitabine-tenofovir AF (BIKTARVY) 50-200-25 MG per tablet 1 tablet  Status:  Discontinued        1 tablet Oral Daily 10/18/20 0610 11/07/20 1451   10/18/20 0415  metroNIDAZOLE (FLAGYL) IVPB 500 mg  Status:  Discontinued        500 mg 100 mL/hr over 60 Minutes Intravenous Every 8 hours 10/18/20 0404 10/19/20 1347   10/18/20 0415  ceFEPIme (MAXIPIME) 2 g in sodium chloride 0.9 % 100 mL IVPB        2 g 200 mL/hr over 30 Minutes Intravenous  Once 10/18/20 0414 10/18/20 0500   10/18/20 0345  vancomycin (VANCOREADY) IVPB 2000 mg/400 mL        2,000 mg 200 mL/hr over 120 Minutes Intravenous  Once 10/18/20 0335 10/18/20 0608          Subjective: Denies thoughts of hurting or harming himself  Objective: Vitals:   12/14/20 1308 12/14/20 1952 12/15/20 0727 12/15/20 1403  BP: (!) 133/92 125/79 105/68 116/63  Pulse: 80 84 88 86  Resp: '17 18 17 18  '$ Temp: 98.4 F (36.9 C) 98.8 F (37.1 C) 98 F (36.7 C) 98.2 F (36.8 C)  TempSrc: Oral Oral Oral Oral  SpO2: 98% 95% 96% 100%  Weight:      Height:        Intake/Output Summary (Last 24 hours) at 12/15/2020 1908 Last data filed at 12/15/2020 0700 Gross per 24 hour  Intake --  Output 1800 ml  Net -1800 ml   Filed Weights   11/20/20 0618 11/23/20 0618 11/23/20 2144  Weight: 117.3 kg 121.4 kg 121.4 kg    Examination:  General: No acute distress. Cardiovascular: RRR Lungs: Clear to auscultation bilaterally  Abdomen: Soft, nontender, nondistended  Neurological: Alert and oriented 3. Moves all extremities 4 . Cranial nerves II through XII grossly intact. Skin: Warm and dry. No rashes or lesions. Extremities:  bilateral BKA     Data Reviewed: I have personally reviewed following labs and imaging studies  CBC: Recent Labs  Lab 12/11/20 0150 12/12/20 0256 12/13/20 0123 12/14/20 0222 12/15/20 0112  WBC 10.3 10.4 9.3 7.7 11.7*  NEUTROABS 7.3 7.8* 6.7 5.2 9.1*  HGB 8.2* 8.1* 7.8* 7.9* 8.1*  HCT 26.2* 25.0* 24.7* 25.3* 25.9*  MCV 93.2 91.9 93.2 92.0 93.2  PLT 404* 381 360 392 0000000    Basic Metabolic Panel: Recent Labs  Lab 12/11/20 0150 12/11/20 1354 12/12/20 0256 12/13/20 0123 12/14/20 0222 12/15/20 0112  NA 135 137 137 134* 137  137 137  K 4.8 4.9 5.0 5.3* 5.1  5.1 5.4*  CL 103 103 106 104 106  105 106  CO2 '24 24 24 22 24  23 24  '$ GLUCOSE 129* 152* 107* 132* 113*  112* 110*  BUN 29* 31* 32* 32* 26*  26* 27*  CREATININE 2.04* 2.33* 2.12* 2.11* 1.92*  1.98* 2.18*  CALCIUM 9.3 9.1 9.1 8.9 9.2  9.2 9.2  MG 2.2  --  2.2 1.9 2.0 2.2  PHOS 4.8*  --  5.0* 4.8* 4.7*  4.7* 5.0*    GFR: Estimated Creatinine Clearance: 49.4 mL/min (Trequan Mar[place]k) (by C-G formula based on SCr of 2.18 mg/dL (H)).  Liver Function Tests: Recent Labs  Lab 12/11/20 0150 12/12/20 0256 12/13/20 0123 12/14/20 0222 12/15/20 0112  AST  --   --   --  21  --   ALT  --   --   --  30  --   ALKPHOS  --   --   --  116  --   BILITOT  --   --   --  0.5  --   PROT  --   --   --  7.0  --   ALBUMIN 2.4* 2.3* 2.3* 2.4*  2.4* 2.5*    CBG: Recent Labs  Lab 12/11/20 0620 12/11/20 1130 12/11/20 1712 12/11/20 2052 12/15/20 1154  GLUCAP 126* 114* 140* 105* 147*     No results found for this or any previous visit (from the past 240 hour(s)).       Radiology Studies: No results found.      Scheduled Meds:  alfuzosin  10 mg Oral Q breakfast   allopurinol  100 mg Oral Daily   vitamin C  1,000 mg Oral Daily   aspirin EC  81 mg Oral Daily   atorvastatin  80 mg Oral Daily   Chlorhexidine Gluconate Cloth  6 each Topical Daily   dapsone  100 mg Oral Daily   docusate sodium  100 mg Oral Daily    dolutegravir  50 mg Oral Daily   doravirine  100 mg Oral Daily   empagliflozin  10 mg Oral Daily   escitalopram  20 mg Oral Daily   finasteride  5 mg Oral Daily   gabapentin  300 mg Oral TID   icosapent Ethyl  2 g Oral BID   lamiVUDine  300 mg Oral Daily   multivitamin with minerals  1 tablet Oral Q24H   oxybutynin  2.5 mg Oral BID   oxyCODONE  10 mg Oral QID   pantoprazole  40 mg Oral Daily   polyethylene glycol  17 g Oral BID   senna  2 tablet Oral QHS   Continuous Infusions:  sodium chloride 50 mL/hr at 12/13/20 0830   magnesium sulfate bolus IVPB     sodium chloride irrigation       LOS: 58 days    Time spent: over 30 min    Fayrene Helper, MD [facility]   To contact the attending provider between 7A-7P or the covering provider during after hours 7P-7A, please log into the web site [email or web address] and access using universal [place] password for that web site. If you do not have the password, please call the hospital operator.  12/15/2020, 7:08 PM

## 2020-12-15 NOTE — TOC Progression Note (Signed)
Transition of Care [facility]) - Progression Note    Patient Details  Name: YUREM SMUTNY MRN: [name] Date of Birth: 15-Jul-1965  [facility]) CM/SW Contact  Emeterio Reeve, [place] Phone Number: 12/15/2020, 4:17 PM  Clinical Narrative:     Pts insurance appeal was approved. [facility] will be able to accept pt on Monday. Pt will need new covid test on Sunday.  Expected Discharge Plan: [place] (vs SNF) Barriers to Discharge: Continued Medical Work up  Expected Discharge Plan and Services Expected Discharge Plan: [place] (vs SNF) In-house Referral: Clinical Social Work Discharge Planning Services: CM Consult Post Acute Care Choice: [place] arrangements for the past 2 months: [place]: RN, Disease Management, PT, OT, Nurse's Aide, Social Work [employer] Agency: [place] Date [place]: 12/14/20 Time [place]: [phone] Representative spoke with at [place]: [place] ([place]) Interventions    Readmission Risk Interventions Readmission Risk Prevention Plan 10/21/2020 11/13/2018 11/13/2018  Transportation Screening Complete - Complete  PCP or Specialist Appt within 3-5 Days - - -  HRI or [place] for [place] - - -  Medication Review Press photographer) Complete - Complete  PCP or Specialist appointment within 3-5 days of discharge Complete Complete -  [place] or Home Care Consult Complete Complete -  SW Recovery Care/Counseling Consult Complete Complete -  Palliative Care Screening Not Applicable Not Applicable -  [place] Complete Not Applicable -  Some encounter information is confidential and restricted. Go to Review Flowsheets activity to see all data.  Some recent data might be hidden   Emeterio Reeve, LCSW Clinical Social Worker

## 2020-12-15 NOTE — TOC Progression Note (Signed)
Transition of Care [facility]) - Initial/Assessment Note    Patient Details  Name: Keith [name] MRN: [name] Date of Birth: 1965-11-10  Transition of Care [facility]) CM/SW Contact:    Milinda Antis, LCSWA Phone Number: 12/15/2020, 10:35 AM  Clinical Narrative:                 09:01-  CSW received a call from [place] with [employer] [phone] requesting more clinicals.  CSW faxed information to [phone].  Expected Discharge Plan: [place] (vs SNF) Barriers to Discharge: Continued Medical Work up   Patient Goals and CMS Choice Patient states their goals for this hospitalization and ongoing recovery are:: snf CMS Medicare.gov Compare Post Acute Care list provided to:: Patient Choice offered to / list presented to : Patient  Expected Discharge Plan and Services Expected Discharge Plan: [place] (vs SNF) In-house Referral: Clinical Social Work Discharge Planning Services: CM Consult Post Acute Care Choice: [place] Living arrangements for the past 2 months: [place]: RN, Disease Management, PT, OT, Nurse's Aide, Social Work [employer] Agency: [place] Date [place]: 12/14/20 Time [place]: [phone] Representative spoke with at [place]: [place] Arrangements/Services Living arrangements for the past 2 months: [place] with:: Parents Patient language and need for interpreter reviewed:: Yes        Need for Family Participation in Patient Care: Yes (Comment) Care giver support system in place?: Yes (comment) Current home services: DME (has walker) Criminal Activity/Legal Involvement Pertinent to Current Situation/Hospitalization: No - Comment as needed  Activities of Daily Living Home Assistive Devices/Equipment: Environmental consultant (specify type), Shower chair with back, Grab bars in shower, Hand-held shower hose ADL  Screening (condition at time of admission) Patient's cognitive ability adequate to safely complete daily activities?: Yes Is the patient deaf or have difficulty hearing?: No Does the patient have difficulty seeing, even when wearing glasses/contacts?: No Does the patient have difficulty concentrating, remembering, or making decisions?: No Patient able to express need for assistance with ADLs?: Yes Does the patient have difficulty dressing or bathing?: Yes Independently performs ADLs?: No Does the patient have difficulty walking or climbing stairs?: Yes Weakness of Legs: Both Weakness of Arms/Hands: None  Permission Sought/Granted Permission sought to share information with : Facility Art therapist granted to share information with : Yes, Verbal Permission Granted     Permission granted to share info w AGENCY: SNFs        Emotional Assessment Appearance:: Appears stated age Attitude/Demeanor/Rapport: Engaged Affect (typically observed): Accepting, Appropriate   Alcohol / Substance Use: Not Applicable Psych Involvement: No (comment)  Admission diagnosis:  Cellulitis [L03.90] Hyperglycemia [R73.9] Renal insufficiency [N28.9] Cellulitis of left foot [L03.116] Normochromic normocytic anemia [D64.9] Elevated blood pressure reading with diagnosis of hypertension [I10] Diabetic ulcer of left heel associated with diabetes mellitus of other type, unspecified ulcer stage ([place]) [id number], L97.429] Patient Active Problem List   Diagnosis Date Noted   Dehiscence of amputation stump (HCC)    Hx of BKA, left ([place])    Cellulitis of left foot    Acute osteomyelitis of left calcaneus (HCC)    Ulcer of heel due to diabetes mellitus (HCC)    Normochromic normocytic anemia    Cellulitis 10/18/2020   Atypical  chest pain 123XX123   Acute metabolic encephalopathy 123XX123   Hyperglycemia due to diabetes mellitus ([place]) 08/29/2020   Hyperglycemia 08/29/2020   Acute  cerebrovascular accident (CVA) due to ischemia [facility]) 04/28/2020   Type 2 diabetes mellitus with hyperlipidemia ([place])    PAD (peripheral artery disease) ([place])    Hypoglycemia 04/25/2020   Acute ST elevation myocardial infarction (STEMI) due to occlusion of circumflex coronary artery ([place]) 03/22/2020   Uncontrolled type 2 diabetes mellitus with hyperglycemia ([place]) 01/14/2019   Elevated glucose 01/14/2019   Hx of BKA, right ([place]) 01/14/2019   Pressure injury of skin 11/11/2018   STEMI (ST elevation myocardial infarction) ([place]) 11/10/2018   CKD (chronic kidney disease) stage 4, GFR 15-29 ml/min ([place]) 11/10/2018   Amputation stump infection ([place]) 10/28/2018   Type II diabetes mellitus with renal manifestations ([place]) 08/07/2018   Uncontrolled type 2 diabetes mellitus with hyperosmolar nonketotic hyperglycemia ([place]) 08/07/2018   Non-pressure chronic ulcer of right calf limited to breakdown of skin ([place]) 07/06/2018   Type 2 diabetes mellitus without complication, without long-term current use of insulin ([place]) 06/15/2018   Chronic low back pain without sciatica 06/15/2018   Idiopathic chronic venous hypertension of left lower extremity with ulcer and inflammation (HCC)    Venous stasis ulcer of left calf limited to breakdown of skin without varicose veins ([place]) 04/23/2018   Acquired absence of right leg below knee ([place]) 06/13/2017   Acute kidney injury superimposed on CKD ([place]) 03/15/2017   Diabetic polyneuropathy associated with type 2 diabetes mellitus ([place]) 01/23/2017   AKI (acute kidney injury) ([place]) 09/28/2016   Nausea vomiting and diarrhea 09/28/2016   Onychomycosis of multiple toenails with type 2 diabetes mellitus ([place]) 08/29/2015   MRSA carrier 04/20/2014   Penile wart 02/22/2014   HIV disease ([place])    Insulin-requiring or dependent type II diabetes mellitus ([place]) 02/04/2014   Dental anomaly 11/20/2012   Arthritis of right knee 02/23/2012   Hyperlipidemia 11/10/2011   Hyponatremia  11/10/2011   Chronic pain 08/07/2011   Meralgia paraesthetica 04/23/2011   ERECTILE DYSFUNCTION 08/22/2008   Elevated blood pressure reading with diagnosis of hypertension 05/19/2008   PCP:  [name], NP Pharmacy[facility]   [id number] [place] [id number]: [phone]: [phone] [id number] [address]one: [phone]: [phone] [address]: [phone]: [phone]   Social Determinants of Health (SDOH) Interventions    Readmission Risk Interventions Readmission Risk Prevention Plan 10/21/2020 11/13/2018 11/13/2018  Transportation Screening Complete - Complete  PCP or Specialist Appt within 3-5 Days - - -  [place] or [place] - -  Medication Review (RPress photographerComplete - Complete  PCP or Specialist appointment within 3-5 days of discharge Complete Complete -  [place] Home Care Consult Complete Complete -  SW Recovery Care/Counseling Consult Complete Complete -  Palliative Care Screening Not Applicable Not Applicable -  Sk[place]om[place] Not Applicable -  Some encounter information is confidential and restricted. Go to Review Flowsheets activity to see all data.  Some recent data might be hidden

## 2020-12-15 NOTE — Plan of Care (Signed)
  Problem: Clinical Measurements: Goal: Ability to avoid or minimize complications of infection will improve Outcome: Progressing   Problem: Skin Integrity: Goal: Skin integrity will improve Outcome: Progressing   Problem: Clinical Measurements: Goal: Ability to maintain clinical measurements within normal limits will improve Outcome: Progressing

## 2020-12-16 DIAGNOSIS — N179 Acute kidney failure, unspecified: Secondary | ICD-10-CM | POA: Diagnosis not present

## 2020-12-16 LAB — CBC WITH DIFFERENTIAL/PLATELET
Abs Immature Granulocytes: 0.05 10*3/uL (ref 0.00–0.07)
Basophils Absolute: 0.1 10*3/uL (ref 0.0–0.1)
Basophils Relative: 1 %
Eosinophils Absolute: 0.3 10*3/uL (ref 0.0–0.5)
Eosinophils Relative: 3 %
HCT: 24.5 % — ABNORMAL LOW (ref 39.0–52.0)
Hemoglobin: 7.7 g/dL — ABNORMAL LOW (ref 13.0–17.0)
Immature Granulocytes: 1 %
Lymphocytes Relative: 18 %
Lymphs Abs: 1.9 10*3/uL (ref 0.7–4.0)
MCH: 29.4 pg (ref 26.0–34.0)
MCHC: 31.4 g/dL (ref 30.0–36.0)
MCV: 93.5 fL (ref 80.0–100.0)
Monocytes Absolute: 0.7 10*3/uL (ref 0.1–1.0)
Monocytes Relative: 7 %
Neutro Abs: 7.3 10*3/uL (ref 1.7–7.7)
Neutrophils Relative %: 70 %
Platelets: 382 10*3/uL (ref 150–400)
RBC: 2.62 MIL/uL — ABNORMAL LOW (ref 4.22–5.81)
RDW: 14.9 % (ref 11.5–15.5)
WBC: 10.3 10*3/uL (ref 4.0–10.5)
nRBC: 0 % (ref 0.0–0.2)

## 2020-12-16 LAB — RENAL FUNCTION PANEL
Albumin: 2.3 g/dL — ABNORMAL LOW (ref 3.5–5.0)
Anion gap: 7 (ref 5–15)
BUN: 31 mg/dL — ABNORMAL HIGH (ref 6–20)
CO2: 24 mmol/L (ref 22–32)
Calcium: 9.1 mg/dL (ref 8.9–10.3)
Chloride: 105 mmol/L (ref 98–111)
Creatinine, Ser: 2.55 mg/dL — ABNORMAL HIGH (ref 0.61–1.24)
GFR, Estimated: 29 mL/min — ABNORMAL LOW (ref >60)
Glucose, Bld: 104 mg/dL — ABNORMAL HIGH (ref 70–99)
Phosphorus: 5.3 mg/dL — ABNORMAL HIGH (ref 2.5–4.6)
Potassium: 4.9 mmol/L (ref 3.5–5.1)
Sodium: 136 mmol/L (ref 135–145)

## 2020-12-16 LAB — MAGNESIUM: Magnesium: 2.3 mg/dL (ref 1.7–2.4)

## 2020-12-16 MED ORDER — IOHEXOL 9 MG/ML PO SOLN
ORAL | Status: AC
  Filled 2020-12-16: qty 1000

## 2020-12-16 NOTE — TOC Progression Note (Signed)
Transition of Care [facility]) - Progression Note    Patient Details  Name: Keith [name] MRN: [name] Date of Birth: 1965/08/03  Transition of Care [facility]) CM/SW [place], [place] Phone Number: 12/16/2020, 2:28 PM  Clinical Narrative:     CSW confirmed with [facility] patient can dc over on Monday. CSW will continue to follow and assist with dc planning needs.  Expected Discharge Plan: [place] (vs SNF) Barriers to Discharge: Continued Medical Work up  Expected Discharge Plan and Services Expected Discharge Plan: [place] (vs SNF) In-house Referral: Clinical Social Work Discharge Planning Services: CM Consult Post Acute Care Choice: [place] arrangements for the past 2 months: [place]: RN, Disease Management, PT, OT, Nurse's Aide, Social Work [employer] Agency: [place] Date [place]: 12/14/20 Time [place]: [phone] Representative spoke with at [place]: [place] ([place]) Interventions    Readmission Risk Interventions Readmission Risk Prevention Plan 10/21/2020 11/13/2018 11/13/2018  Transportation Screening Complete - Complete  PCP or Specialist Appt within 3-5 Days - - -  HRI or [place] for [place] - - -  Medication Review Press photographer) Complete - Complete  PCP or Specialist appointment within 3-5 days of discharge Complete Complete -  [place] or Home Care Consult Complete Complete -  SW Recovery Care/Counseling Consult Complete Complete -  Palliative Care Screening Not Applicable Not Applicable -  [place] Complete Not Applicable -  Some encounter information is confidential and restricted. Go to Review Flowsheets activity to see all data.  Some recent data might be hidden

## 2020-12-16 NOTE — Plan of Care (Signed)
  Problem: Activity: Goal: Risk for activity intolerance will decrease Outcome: Progressing   Problem: Nutrition: Goal: Adequate nutrition will be maintained Outcome: Progressing   Problem: Elimination: Goal: Will not experience complications related to bowel motility Outcome: Progressing   

## 2020-12-16 NOTE — Plan of Care (Signed)
VSS. C/o pain, given PRN and scheduled medications. OOB to chair transfer. Drsg intact. Call bell within reach. Alarms on.   Problem: Clinical Measurements: Goal: Ability to maintain clinical measurements within normal limits will improve Outcome: Progressing Goal: Will remain free from infection Outcome: Progressing   Problem: Skin Integrity: Goal: Skin integrity will improve Outcome: Progressing   Problem: Activity: Goal: Risk for activity intolerance will decrease Outcome: Progressing   Problem: Pain Managment: Goal: General experience of comfort will improve Outcome: Progressing   Problem: Safety: Goal: Ability to remain free from injury will improve Outcome: Progressing

## 2020-12-16 NOTE — Progress Notes (Signed)
PROGRESS NOTE    CHOSEN BEILKE  [id number] DOB: April 18, 1965 DOA: 10/17/2020 [name], NP   Chief Complaint  Patient presents with   Foot Ulcer   Brief Narrative: 56 yo with PMH T2DM, HTN, HLD, CAD, CKD 3B, HIV, s/p R BKA who initially presented on 7/13 with L heel ulceration.  He was seen by [name] and underwent transtibial amputation with application of prevena wound vac on 7/22.  Hospitalization was complicated by AKI and renal was consulted.  AKI was thought to be 2/2 ATN 2/2 hypotension.  He had tunneled HD cath placed in preparation for HD on 7/28.  Hospitalization c/b gross hematuria on 8/4, he underwent cystoscopy, clot evacuation, and fulguration of prostate on 8/4.  Tunneled HD catheter removed on 8/8.  8/10 was noted to have wound dehiscence and bleeding from his L BKA and he had revision of his BKA on 8/12.  Given his bleeding on DAPT with PCI 03/22/20, case was discussed with cardiology who recommended continuing aspirin and holding brilinta until bleeding stops and H/H stable (8/14, cards note).  He went back for revision of L BKA again on 8/27.  Hospitalization complicated recurrent hematuria on 8/22 and again on 9/5.  He had CBI and plan for void trial on 9/7.    Assessment & Plan:   Principal Problem:   AKI (acute kidney injury) ([place]) Active Problems:   Elevated blood pressure reading with diagnosis of hypertension   HIV disease ([place])   Uncontrolled type 2 diabetes mellitus with hyperglycemia ([place])   Cellulitis   Ulcer of heel due to diabetes mellitus (HCC)   Normochromic normocytic anemia   Cellulitis of left foot   Acute osteomyelitis of left calcaneus (HCC)   Dehiscence of amputation stump (HCC)   Hx of BKA, left (HCC)  Gross Hematuria S/p cystoscopy, clot evacuation, fulguration of prostate on 11/09/2020 finasteride Outpatient follow up >700 cc on bladder scan 9/8 -> replace foley catheter 9/8 -- will need outpatient follow up with urology  AKI  on CKD IIIb  Hyperkalemia Baseline creatinine about 2.5 Fluctuating creatinine, has trended up to ~2.5 again Negative w/u for hydro/acute GN Tunneled dialysis catheter has been removed Renal has now signed off - plan for outpatient nephology follow up (see 8/7 renal note)  Left Heel Osteomyelitis  Recurrent Wound Dehiscence and Bleeding 7/22 transtibial amputation Revision on 8/12 and 8/27 Wound vac removed today per ortho Needs 1 week follow up with orthopedics after discharge  Acute blood loss anemia Continue to monitor, transfuse as indicated Iron def, aocd Hb relatively stable, follow   Uncontrolled T2 DM A1c 13.14 Aug 2020 (question accuracy of 8/24 A1c of 5.9 with blood loss anemia) Though doing well on jardiance alone Continue to monitor  CAD s/p Stenting 03/2020 Brilinta discontinued based on discussion with cardiology on 8/14 If pt stabilizes, plan to resume DAPT with aspirin/brilinta when ok per surgery  HLD Vascepa, lipitor  HIV Continue antiretrovirals  Depressed Mood He denies SI.  Declined any medications at this time.  Notes he was talking with Marlin Jarrard family member who saw his legs and they got upset, which got him down.  Will continue to monitor.  DVT prophylaxis: SCD Code Status: full  Family Communication: none at bedside Disposition:   Status is: Inpatient  Remains inpatient appropriate because:Inpatient level of care appropriate due to severity of illness  Dispo:  Patient From:    Planned Disposition: Home with Health Care Svc  Medically stable for  discharge:           Consultants:  Ortho urology  Procedures:  See prior notes and above  Antimicrobials:  Anti-infectives (From admission, onward)    Start     Dose/Rate Route Frequency Ordered Stop   12/02/20 1230  ceFAZolin (ANCEF) IVPB 2g/100 mL premix        2 g 200 mL/hr over 30 Minutes Intravenous Every 8 hours 12/02/20 1134 12/02/20 2245   12/02/20 0600  ceFAZolin (ANCEF) IVPB  3g/100 mL premix        3 g 200 mL/hr over 30 Minutes Intravenous On call to O.R. 12/01/20 1909 12/02/20 0950   12/01/20 0600  ceFAZolin (ANCEF) IVPB 2g/100 mL premix  Status:  Discontinued        2 g 200 mL/hr over 30 Minutes Intravenous On call to O.R. 11/30/20 1817 11/30/20 1819   12/01/20 0600  ceFAZolin (ANCEF) IVPB 3g/100 mL premix        3 g 200 mL/hr over 30 Minutes Intravenous On call to O.R. 11/30/20 1820 12/02/20 0559   11/18/20 0100  ceFAZolin (ANCEF) IVPB 2g/100 mL premix        2 g 200 mL/hr over 30 Minutes Intravenous Every 8 hours 11/17/20 2045 11/18/20 1038   11/17/20 0600  ceFAZolin (ANCEF) IVPB 3g/100 mL premix        3 g 200 mL/hr over 30 Minutes Intravenous On call to O.R. 11/16/20 1941 11/17/20 1901   11/11/20 1200  vancomycin (VANCOREADY) IVPB 500 mg/100 mL  Status:  Discontinued        500 mg 100 mL/hr over 60 Minutes Intravenous Every T-Th-Sa (Hemodialysis) 11/09/20 1414 11/11/20 1328   11/10/20 0000  Dolutegravir-lamiVUDine (DOVATO) 50-300 MG TABS        1 tablet Oral Daily 11/10/20 1048     11/10/20 0000  doravirine (PIFELTRO) 100 MG TABS tablet        100 mg Oral Daily 11/10/20 1048     11/08/20 1000  dolutegravir (TIVICAY) tablet 50 mg        50 mg Oral Daily 11/07/20 1451     11/08/20 1000  lamiVUDine (EPIVIR) tablet 300 mg        300 mg Oral Daily 11/07/20 1451     11/08/20 1000  doravirine (PIFELTRO) tablet 100 mg        100 mg Oral Daily 11/07/20 1451     11/02/20 1130  ceFAZolin (ANCEF) IVPB 2g/100 mL premix        2 g 200 mL/hr over 30 Minutes Intravenous To Radiology 11/02/20 1042 11/02/20 1704   10/27/20 1245  ceFAZolin (ANCEF) IVPB 3g/100 mL premix        3 g 200 mL/hr over 30 Minutes Intravenous On call to O.R. 10/27/20 0755 10/27/20 1608   10/20/20 1415  linezolid (ZYVOX) tablet 600 mg  Status:  Discontinued        600 mg Oral Every 12 hours 10/20/20 1326 10/28/20 1115   10/19/20 1445  metroNIDAZOLE (FLAGYL) tablet 500 mg  Status:   Discontinued        500 mg Oral Every 12 hours 10/19/20 1347 10/28/20 1115   10/18/20 1600  ceFEPIme (MAXIPIME) 2 g in sodium chloride 0.9 % 100 mL IVPB  Status:  Discontinued        2 g 200 mL/hr over 30 Minutes Intravenous Every 12 hours 10/18/20 0414 10/28/20 1112   10/18/20 1000  dapsone tablet 100 mg  100 mg Oral Daily 10/18/20 0425     10/18/20 1000  bictegravir-emtricitabine-tenofovir AF (BIKTARVY) 50-200-25 MG per tablet 1 tablet  Status:  Discontinued        1 tablet Oral Daily 10/18/20 0610 11/07/20 1451   10/18/20 0415  metroNIDAZOLE (FLAGYL) IVPB 500 mg  Status:  Discontinued        500 mg 100 mL/hr over 60 Minutes Intravenous Every 8 hours 10/18/20 0404 10/19/20 1347   10/18/20 0415  ceFEPIme (MAXIPIME) 2 g in sodium chloride 0.9 % 100 mL IVPB        2 g 200 mL/hr over 30 Minutes Intravenous  Once 10/18/20 0414 10/18/20 0500   10/18/20 0345  vancomycin (VANCOREADY) IVPB 2000 mg/400 mL        2,000 mg 200 mL/hr over 120 Minutes Intravenous  Once 10/18/20 0335 10/18/20 0608          Subjective: No complaints  Objective: Vitals:   12/15/20 0727 12/15/20 1403 12/15/20 2009 12/16/20 0757  BP: 105/68 116/63 (!) 97/57 (!) 139/95  Pulse: 88 86 77 77  Resp: '17 18 16 17  '$ Temp: 98 F (36.7 C) 98.2 F (36.8 C) 98.9 F (37.2 C) 98.5 F (36.9 C)  TempSrc: Oral Oral Oral Oral  SpO2: 96% 100% 99% 96%  Weight:      Height:        Intake/Output Summary (Last 24 hours) at 12/16/2020 1706 Last data filed at 12/16/2020 1500 Gross per 24 hour  Intake 720 ml  Output 1800 ml  Net -1080 ml   Filed Weights   11/20/20 0618 11/23/20 0618 11/23/20 2144  Weight: 117.3 kg 121.4 kg 121.4 kg    Examination:  General: No acute distress. Cardiovascular: RRR Lungs: unlabored Abdomen: Soft, nontender, nondistended  Neurological: Alert and oriented 3. Moves all extremities 4 . Cranial nerves II through XII grossly intact. Skin: Warm and dry. No rashes or  lesions. Extremities: bilateral BKA     Data Reviewed: I have personally reviewed following labs and imaging studies  CBC: Recent Labs  Lab 12/12/20 0256 12/13/20 0123 12/14/20 0222 12/15/20 0112 12/16/20 0100  WBC 10.4 9.3 7.7 11.7* 10.3  NEUTROABS 7.8* 6.7 5.2 9.1* 7.3  HGB 8.1* 7.8* 7.9* 8.1* 7.7*  HCT 25.0* 24.7* 25.3* 25.9* 24.5*  MCV 91.9 93.2 92.0 93.2 93.5  PLT 381 360 392 385 [id number]    Basic Metabolic Panel: Recent Labs  Lab 12/12/20 0256 12/13/20 0123 12/14/20 0222 12/15/20 0112 12/15/20 1929 12/16/20 0100  NA 137 134* 137  137 137 136 136  K 5.0 5.3* 5.1  5.1 5.4* 5.0 4.9  CL 106 104 106  105 106 104 105  CO2 '24 22 24  23 24 24 24  '$ GLUCOSE 107* 132* 113*  112* 110* 105* 104*  BUN 32* 32* 26*  26* 27* 31* 31*  CREATININE 2.12* 2.11* 1.92*  1.98* 2.18* 2.50* 2.55*  CALCIUM 9.1 8.9 9.2  9.2 9.2 9.1 9.1  MG 2.2 1.9 2.0 2.2  --  2.3  PHOS 5.0* 4.8* 4.7*  4.7* 5.0*  --  5.3*    GFR: Estimated Creatinine Clearance: 42.3 mL/min (Claire Dolores) (by C-G formula based on SCr of 2.55 mg/dL (H)).  Liver Function Tests: Recent Labs  Lab 12/12/20 0256 12/13/20 0123 12/14/20 0222 12/15/20 0112 12/16/20 0100  AST  --   --  21  --   --   ALT  --   --  30  --   --  ALKPHOS  --   --  116  --   --   BILITOT  --   --  0.5  --   --   PROT  --   --  7.0  --   --   ALBUMIN 2.3* 2.3* 2.4*  2.4* 2.5* 2.3*    CBG: Recent Labs  Lab 12/11/20 0620 12/11/20 1130 12/11/20 1712 12/11/20 2052 12/15/20 1154  GLUCAP 126* 114* 140* 105* 147*     No results found for this or any previous visit (from the past 240 hour(s)).       Radiology Studies: No results found.      Scheduled Meds:  alfuzosin  10 mg Oral Q breakfast   allopurinol  100 mg Oral Daily   vitamin C  1,000 mg Oral Daily   aspirin EC  81 mg Oral Daily   atorvastatin  80 mg Oral Daily   Chlorhexidine Gluconate Cloth  6 each Topical Daily   dapsone  100 mg Oral Daily   docusate sodium  100  mg Oral Daily   dolutegravir  50 mg Oral Daily   doravirine  100 mg Oral Daily   empagliflozin  10 mg Oral Daily   escitalopram  20 mg Oral Daily   finasteride  5 mg Oral Daily   gabapentin  300 mg Oral TID   icosapent Ethyl  2 g Oral BID   iohexol       lamiVUDine  300 mg Oral Daily   multivitamin with minerals  1 tablet Oral Q24H   oxybutynin  2.5 mg Oral BID   oxyCODONE  10 mg Oral QID   pantoprazole  40 mg Oral Daily   polyethylene glycol  17 g Oral BID   senna  2 tablet Oral QHS   Continuous Infusions:  sodium chloride 50 mL/hr at 12/13/20 0830   magnesium sulfate bolus IVPB     sodium chloride irrigation       LOS: 59 days    Time spent: over 30 min    Fayrene Helper, MD [facility]   To contact the attending provider between 7A-7P or the covering provider during after hours 7P-7A, please log into the web site [email or web address] and access using universal [place] password for that web site. If you do not have the password, please call the hospital operator.  12/16/2020, 5:06 PM

## 2020-12-17 DIAGNOSIS — N179 Acute kidney failure, unspecified: Secondary | ICD-10-CM | POA: Diagnosis not present

## 2020-12-17 LAB — COMPREHENSIVE METABOLIC PANEL
ALT: 23 U/L (ref 0–44)
AST: 24 U/L (ref 15–41)
Albumin: 2.4 g/dL — ABNORMAL LOW (ref 3.5–5.0)
Alkaline Phosphatase: 110 U/L (ref 38–126)
Anion gap: 10 (ref 5–15)
BUN: 34 mg/dL — ABNORMAL HIGH (ref 6–20)
CO2: 23 mmol/L (ref 22–32)
Calcium: 8.9 mg/dL (ref 8.9–10.3)
Chloride: 102 mmol/L (ref 98–111)
Creatinine, Ser: 2.7 mg/dL — ABNORMAL HIGH (ref 0.61–1.24)
GFR, Estimated: 27 mL/min — ABNORMAL LOW (ref >60)
Glucose, Bld: 104 mg/dL — ABNORMAL HIGH (ref 70–99)
Potassium: 5 mmol/L (ref 3.5–5.1)
Sodium: 135 mmol/L (ref 135–145)
Total Bilirubin: 0.6 mg/dL (ref 0.3–1.2)
Total Protein: 7.2 g/dL (ref 6.5–8.1)

## 2020-12-17 LAB — CBC WITH DIFFERENTIAL/PLATELET
Abs Immature Granulocytes: 0.03 10*3/uL (ref 0.00–0.07)
Basophils Absolute: 0 10*3/uL (ref 0.0–0.1)
Basophils Relative: 0 %
Eosinophils Absolute: 0.5 10*3/uL (ref 0.0–0.5)
Eosinophils Relative: 5 %
HCT: 24.5 % — ABNORMAL LOW (ref 39.0–52.0)
Hemoglobin: 7.5 g/dL — ABNORMAL LOW (ref 13.0–17.0)
Immature Granulocytes: 0 %
Lymphocytes Relative: 20 %
Lymphs Abs: 2 10*3/uL (ref 0.7–4.0)
MCH: 28.6 pg (ref 26.0–34.0)
MCHC: 30.6 g/dL (ref 30.0–36.0)
MCV: 93.5 fL (ref 80.0–100.0)
Monocytes Absolute: 0.7 10*3/uL (ref 0.1–1.0)
Monocytes Relative: 7 %
Neutro Abs: 7 10*3/uL (ref 1.7–7.7)
Neutrophils Relative %: 68 %
Platelets: 375 10*3/uL (ref 150–400)
RBC: 2.62 MIL/uL — ABNORMAL LOW (ref 4.22–5.81)
RDW: 14.7 % (ref 11.5–15.5)
WBC: 10.3 10*3/uL (ref 4.0–10.5)
nRBC: 0 % (ref 0.0–0.2)

## 2020-12-17 LAB — MAGNESIUM: Magnesium: 2.2 mg/dL (ref 1.7–2.4)

## 2020-12-17 LAB — SARS CORONAVIRUS 2 (TAT 6-24 HRS): SARS Coronavirus 2: NEGATIVE

## 2020-12-17 LAB — PHOSPHORUS: Phosphorus: 5.5 mg/dL — ABNORMAL HIGH (ref 2.5–4.6)

## 2020-12-17 NOTE — Plan of Care (Signed)
VSS. Pivot to chair. Drsg intact. Foley intact. C/o pain, given PRN meds. Call bell within reach. No other complaints throughout the day.   Covid test pending, place for discharge to facility tomorrow.   Problem: Clinical Measurements: Goal: Ability to maintain clinical measurements within normal limits will improve Outcome: Progressing Goal: Will remain free from infection Outcome: Progressing   Problem: Activity: Goal: Risk for activity intolerance will decrease Outcome: Progressing   Problem: Pain Managment: Goal: General experience of comfort will improve Outcome: Progressing   Problem: Safety: Goal: Ability to remain free from injury will improve Outcome: Progressing   Problem: Skin Integrity: Goal: Risk for impaired skin integrity will decrease Outcome: Progressing

## 2020-12-17 NOTE — Progress Notes (Signed)
PROGRESS NOTE    Keith [name]  [id number] DOB: 08/17/1965 DOA: 10/17/2020 [name], NP   Chief Complaint  Patient presents with   Foot Ulcer   Brief Narrative: 55 yo with PMH T2DM, HTN, HLD, CAD, CKD 3B, HIV, s/p R BKA who initially presented on 7/13 with L heel ulceration.  He was seen by [name] and underwent transtibial amputation with application of prevena wound vac on 7/22.  Hospitalization was complicated by AKI and renal was consulted.  AKI was thought to be 2/2 ATN 2/2 hypotension.  He had tunneled HD cath placed in preparation for HD on 7/28.  Hospitalization c/b gross hematuria on 8/4, he underwent cystoscopy, clot evacuation, and fulguration of prostate on 8/4.  Tunneled HD catheter removed on 8/8.  8/10 was noted to have wound dehiscence and bleeding from his L BKA and he had revision of his BKA on 8/12.  Given his bleeding on DAPT with PCI 03/22/20, case was discussed with cardiology who recommended continuing aspirin and holding brilinta until bleeding stops and H/H stable (8/14, cards note).  He went back for revision of L BKA again on 8/27.  Hospitalization complicated recurrent hematuria on 8/22 and again on 9/5.  He had CBI and plan for void trial on 9/7.    Assessment & Plan:   Principal Problem:   AKI (acute kidney injury) ([place]) Active Problems:   Elevated blood pressure reading with diagnosis of hypertension   HIV disease ([place])   Uncontrolled type 2 diabetes mellitus with hyperglycemia ([place])   Cellulitis   Ulcer of heel due to diabetes mellitus (HCC)   Normochromic normocytic anemia   Cellulitis of left foot   Acute osteomyelitis of left calcaneus (HCC)   Dehiscence of amputation stump (HCC)   Hx of BKA, left (HCC)  Gross Hematuria S/p cystoscopy, clot evacuation, fulguration of prostate on 11/09/2020 finasteride Outpatient follow up >700 cc on bladder scan 9/8 -> replace foley catheter 9/8 -- will need outpatient follow up with urology  AKI  on CKD IIIb  Hyperkalemia Baseline creatinine about 2.5 Fluctuating creatinine, has trended up to ~2.7 (follow, if worsening may need to reach out to nephrology again) Negative w/u for hydro/acute GN Tunneled dialysis catheter has been removed Renal has now signed off - plan for outpatient nephology follow up (see 8/7 renal note)  Left Heel Osteomyelitis  Recurrent Wound Dehiscence and Bleeding 7/22 transtibial amputation Revision on 8/12 and 8/27 Wound vac removed today per ortho Needs 1 week follow up with orthopedics after discharge  Acute blood loss anemia Continue to monitor, transfuse as indicated Iron def, aocd Hb relatively stable, follow   Uncontrolled T2 DM A1c 13.14 Aug 2020 (question accuracy of 8/24 A1c of 5.9 with blood loss anemia) Though doing well on jardiance alone Continue to monitor  CAD s/p Stenting 03/2020 Brilinta discontinued based on discussion with cardiology on 8/14 If pt stabilizes, plan to resume DAPT with aspirin/brilinta when ok per surgery  HLD Vascepa, lipitor  HIV Continue antiretrovirals  Depressed Mood He denies SI.  Declined any medications at this time.   DVT prophylaxis: SCD Code Status: full  Family Communication: none at bedside Disposition:   Status is: Inpatient  Remains inpatient appropriate because:Inpatient level of care appropriate due to severity of illness  Dispo:  Patient From:    Planned Disposition: Home with Health Care Svc  Medically stable for discharge:           Consultants:  Ortho urology  Procedures:  See prior notes and above  Antimicrobials:  Anti-infectives (From admission, onward)    Start     Dose/Rate Route Frequency Ordered Stop   12/02/20 1230  ceFAZolin (ANCEF) IVPB 2g/100 mL premix        2 g 200 mL/hr over 30 Minutes Intravenous Every 8 hours 12/02/20 1134 12/02/20 2245   12/02/20 0600  ceFAZolin (ANCEF) IVPB 3g/100 mL premix        3 g 200 mL/hr over 30 Minutes Intravenous On  call to O.R. 12/01/20 1909 12/02/20 0950   12/01/20 0600  ceFAZolin (ANCEF) IVPB 2g/100 mL premix  Status:  Discontinued        2 g 200 mL/hr over 30 Minutes Intravenous On call to O.R. 11/30/20 1817 11/30/20 1819   12/01/20 0600  ceFAZolin (ANCEF) IVPB 3g/100 mL premix        3 g 200 mL/hr over 30 Minutes Intravenous On call to O.R. 11/30/20 1820 12/02/20 0559   11/18/20 0100  ceFAZolin (ANCEF) IVPB 2g/100 mL premix        2 g 200 mL/hr over 30 Minutes Intravenous Every 8 hours 11/17/20 2045 11/18/20 1038   11/17/20 0600  ceFAZolin (ANCEF) IVPB 3g/100 mL premix        3 g 200 mL/hr over 30 Minutes Intravenous On call to O.R. 11/16/20 1941 11/17/20 1901   11/11/20 1200  vancomycin (VANCOREADY) IVPB 500 mg/100 mL  Status:  Discontinued        500 mg 100 mL/hr over 60 Minutes Intravenous Every T-Th-Sa (Hemodialysis) 11/09/20 1414 11/11/20 1328   11/10/20 0000  Dolutegravir-lamiVUDine (DOVATO) 50-300 MG TABS        1 tablet Oral Daily 11/10/20 1048     11/10/20 0000  doravirine (PIFELTRO) 100 MG TABS tablet        100 mg Oral Daily 11/10/20 1048     11/08/20 1000  dolutegravir (TIVICAY) tablet 50 mg        50 mg Oral Daily 11/07/20 1451     11/08/20 1000  lamiVUDine (EPIVIR) tablet 300 mg        300 mg Oral Daily 11/07/20 1451     11/08/20 1000  doravirine (PIFELTRO) tablet 100 mg        100 mg Oral Daily 11/07/20 1451     11/02/20 1130  ceFAZolin (ANCEF) IVPB 2g/100 mL premix        2 g 200 mL/hr over 30 Minutes Intravenous To Radiology 11/02/20 1042 11/02/20 1704   10/27/20 1245  ceFAZolin (ANCEF) IVPB 3g/100 mL premix        3 g 200 mL/hr over 30 Minutes Intravenous On call to O.R. 10/27/20 0755 10/27/20 1608   10/20/20 1415  linezolid (ZYVOX) tablet 600 mg  Status:  Discontinued        600 mg Oral Every 12 hours 10/20/20 1326 10/28/20 1115   10/19/20 1445  metroNIDAZOLE (FLAGYL) tablet 500 mg  Status:  Discontinued        500 mg Oral Every 12 hours 10/19/20 1347 10/28/20 1115    10/18/20 1600  ceFEPIme (MAXIPIME) 2 g in sodium chloride 0.9 % 100 mL IVPB  Status:  Discontinued        2 g 200 mL/hr over 30 Minutes Intravenous Every 12 hours 10/18/20 0414 10/28/20 1112   10/18/20 1000  dapsone tablet 100 mg        100 mg Oral Daily 10/18/20 0425     10/18/20 1000  bictegravir-emtricitabine-tenofovir AF (  BIKTARVY) 50-200-25 MG per tablet 1 tablet  Status:  Discontinued        1 tablet Oral Daily 10/18/20 0610 11/07/20 1451   10/18/20 0415  metroNIDAZOLE (FLAGYL) IVPB 500 mg  Status:  Discontinued        500 mg 100 mL/hr over 60 Minutes Intravenous Every 8 hours 10/18/20 0404 10/19/20 1347   10/18/20 0415  ceFEPIme (MAXIPIME) 2 g in sodium chloride 0.9 % 100 mL IVPB        2 g 200 mL/hr over 30 Minutes Intravenous  Once 10/18/20 0414 10/18/20 0500   10/18/20 0345  vancomycin (VANCOREADY) IVPB 2000 mg/400 mL        2,000 mg 200 mL/hr over 120 Minutes Intravenous  Once 10/18/20 0335 10/18/20 0608          Subjective: Denies any complaints  Objective: Vitals:   12/15/20 2009 12/16/20 0757 12/16/20 2201 12/17/20 1326  BP: (!) 97/57 (!) 139/95 113/83 103/78  Pulse: 77 77 89 80  Resp: '16 17 18 17  '$ Temp: 98.9 F (37.2 C) 98.5 F (36.9 C) 98.7 F (37.1 C) 98.2 F (36.8 C)  TempSrc: Oral Oral Oral Oral  SpO2: 99% 96% 100% 100%  Weight:      Height:        Intake/Output Summary (Last 24 hours) at 12/17/2020 1850 Last data filed at 12/17/2020 1700 Gross per 24 hour  Intake 1080 ml  Output 1000 ml  Net 80 ml   Filed Weights   11/20/20 0618 11/23/20 0618 11/23/20 2144  Weight: 117.3 kg 121.4 kg 121.4 kg    Examination:  General: No acute distress. Lungs: unlabored Neurological: Alert and oriented 3. Moves all extremities 4 . Cranial nerves II through XII grossly intact. Skin: No rashes or lesions. Extremities: bilateral BKA      Data Reviewed: I have personally reviewed following labs and imaging studies  CBC: Recent Labs  Lab  12/13/20 0123 12/14/20 0222 12/15/20 0112 12/16/20 0100 12/17/20 0308  WBC 9.3 7.7 11.7* 10.3 10.3  NEUTROABS 6.7 5.2 9.1* 7.3 7.0  HGB 7.8* 7.9* 8.1* 7.7* 7.5*  HCT 24.7* 25.3* 25.9* 24.5* 24.5*  MCV 93.2 92.0 93.2 93.5 93.5  PLT 360 392 385 382 [id number]    Basic Metabolic Panel: Recent Labs  Lab 12/13/20 0123 12/14/20 0222 12/15/20 0112 12/15/20 1929 12/16/20 0100 12/17/20 0308  NA 134* 137  137 137 136 136 135  K 5.3* 5.1  5.1 5.4* 5.0 4.9 5.0  CL 104 106  105 106 104 105 102  CO2 '22 24  23 24 24 24 23  '$ GLUCOSE 132* 113*  112* 110* 105* 104* 104*  BUN 32* 26*  26* 27* 31* 31* 34*  CREATININE 2.11* 1.92*  1.98* 2.18* 2.50* 2.55* 2.70*  CALCIUM 8.9 9.2  9.2 9.2 9.1 9.1 8.9  MG 1.9 2.0 2.2  --  2.3 2.2  PHOS 4.8* 4.7*  4.7* 5.0*  --  5.3* 5.5*    GFR: Estimated Creatinine Clearance: 39.9 mL/min (Abbygale Lapid) (by C-G formula based on SCr of 2.7 mg/dL (H)).  Liver Function Tests: Recent Labs  Lab 12/13/20 0123 12/14/20 0222 12/15/20 0112 12/16/20 0100 12/17/20 0308  AST  --  21  --   --  24  ALT  --  30  --   --  23  ALKPHOS  --  116  --   --  110  BILITOT  --  0.5  --   --  0.6  PROT  --  7.0  --   --  7.2  ALBUMIN 2.3* 2.4*  2.4* 2.5* 2.3* 2.4*    CBG: Recent Labs  Lab 12/11/20 0620 12/11/20 1130 12/11/20 1712 12/11/20 2052 12/15/20 1154  GLUCAP 126* 114* 140* 105* 147*     No results found for this or any previous visit (from the past 240 hour(s)).       Radiology Studies: No results found.      Scheduled Meds:  alfuzosin  10 mg Oral Q breakfast   allopurinol  100 mg Oral Daily   vitamin C  1,000 mg Oral Daily   aspirin EC  81 mg Oral Daily   atorvastatin  80 mg Oral Daily   Chlorhexidine Gluconate Cloth  6 each Topical Daily   dapsone  100 mg Oral Daily   docusate sodium  100 mg Oral Daily   dolutegravir  50 mg Oral Daily   doravirine  100 mg Oral Daily   empagliflozin  10 mg Oral Daily   escitalopram  20 mg Oral Daily    finasteride  5 mg Oral Daily   gabapentin  300 mg Oral TID   icosapent Ethyl  2 g Oral BID   lamiVUDine  300 mg Oral Daily   multivitamin with minerals  1 tablet Oral Q24H   oxybutynin  2.5 mg Oral BID   oxyCODONE  10 mg Oral QID   pantoprazole  40 mg Oral Daily   polyethylene glycol  17 g Oral BID   senna  2 tablet Oral QHS   Continuous Infusions:  sodium chloride 50 mL/hr at 12/13/20 0830   magnesium sulfate bolus IVPB     sodium chloride irrigation       LOS: 60 days    Time spent: over 30 min    Fayrene Helper, MD [facility]   To contact the attending provider between 7A-7P or the covering provider during after hours 7P-7A, please log into the web site [email or web address] and access using universal [place] password for that web site. If you do not have the password, please call the hospital operator.  12/17/2020, 6:50 PM

## 2020-12-17 NOTE — Plan of Care (Signed)
  Problem: Clinical Measurements: Goal: Ability to avoid or minimize complications of infection will improve Outcome: Progressing   Problem: Skin Integrity: Goal: Skin integrity will improve Outcome: Progressing   Problem: Clinical Measurements: Goal: Ability to maintain clinical measurements within normal limits will improve Outcome: Progressing Goal: Will remain free from infection Outcome: Progressing

## 2020-12-17 NOTE — TOC Progression Note (Signed)
Transition of Care [facility]) - Progression Note    Patient Details  Name: Keith [name] MRN: [name] Date of Birth: 1965/12/10  Transition of Care [facility]) CM/SW [place], Horace Phone Number: 12/17/2020, 9:33 AM  Clinical Narrative:     SW requested COVID test from bedside RN for SNF tomorrow  Expected Discharge Plan: [place] (vs SNF) Barriers to Discharge: Continued Medical Work up  Expected Discharge Plan and Services Expected Discharge Plan: [name] (vs SNF) In-house Referral: Clinical Social Work Discharge Planning Services: CM Consult Post Acute Care Choice: [place] arrangements for the past 2 months: Single Family Home                           HH Arranged: RN, Disease Management, PT, OT, Nurse's Aide, Social Work [employer] Agency: [place] Date [place]: 12/14/20 Time [place]: [phone] Representative spoke with at [place]: [place] ([place]) Interventions    Readmission Risk Interventions Readmission Risk Prevention Plan 10/21/2020 11/13/2018 11/13/2018  Transportation Screening Complete - Complete  PCP or Specialist Appt within 3-5 Days - - -  HRI or [place] for [place] - - -  Medication Review Press photographer) Complete - Complete  PCP or Specialist appointment within 3-5 days of discharge Complete Complete -  [place] or Home Care Consult Complete Complete -  SW Recovery Care/Counseling Consult Complete Complete -  Palliative Care Screening Not Applicable Not Applicable -  [place] Complete Not Applicable -  Some encounter information is confidential and restricted. Go to Review Flowsheets activity to see all data.  Some recent data might be hidden

## 2020-12-18 ENCOUNTER — Inpatient Hospital Stay (HOSPITAL_COMMUNITY): Payer: Medicare HMO

## 2020-12-18 DIAGNOSIS — N179 Acute kidney failure, unspecified: Secondary | ICD-10-CM | POA: Diagnosis not present

## 2020-12-18 LAB — MAGNESIUM: Magnesium: 2.2 mg/dL (ref 1.7–2.4)

## 2020-12-18 LAB — CBC
HCT: 23.6 % — ABNORMAL LOW (ref 39.0–52.0)
Hemoglobin: 7.2 g/dL — ABNORMAL LOW (ref 13.0–17.0)
MCH: 28.7 pg (ref 26.0–34.0)
MCHC: 30.5 g/dL (ref 30.0–36.0)
MCV: 94 fL (ref 80.0–100.0)
Platelets: 396 10*3/uL (ref 150–400)
RBC: 2.51 MIL/uL — ABNORMAL LOW (ref 4.22–5.81)
RDW: 14.7 % (ref 11.5–15.5)
WBC: 8.5 10*3/uL (ref 4.0–10.5)
nRBC: 0 % (ref 0.0–0.2)

## 2020-12-18 LAB — BASIC METABOLIC PANEL
Anion gap: 8 (ref 5–15)
BUN: 34 mg/dL — ABNORMAL HIGH (ref 6–20)
CO2: 24 mmol/L (ref 22–32)
Calcium: 8.9 mg/dL (ref 8.9–10.3)
Chloride: 103 mmol/L (ref 98–111)
Creatinine, Ser: 2.87 mg/dL — ABNORMAL HIGH (ref 0.61–1.24)
GFR, Estimated: 25 mL/min — ABNORMAL LOW (ref >60)
Glucose, Bld: 102 mg/dL — ABNORMAL HIGH (ref 70–99)
Potassium: 5.2 mmol/L — ABNORMAL HIGH (ref 3.5–5.1)
Sodium: 135 mmol/L (ref 135–145)

## 2020-12-18 LAB — GLUCOSE, CAPILLARY: Glucose-Capillary: 126 mg/dL — ABNORMAL HIGH (ref 70–99)

## 2020-12-18 LAB — URINALYSIS, ROUTINE W REFLEX MICROSCOPIC
Bilirubin Urine: NEGATIVE
Glucose, UA: >=500 mg/dL — AB
Ketones, ur: NEGATIVE mg/dL
Nitrite: POSITIVE — AB
Protein, ur: 100 mg/dL — AB
Specific Gravity, Urine: 1.01 (ref 1.005–1.030)
pH: 8.5 — ABNORMAL HIGH (ref 5.0–8.0)

## 2020-12-18 LAB — URINALYSIS, MICROSCOPIC (REFLEX)
RBC / HPF: NONE SEEN RBC/hpf (ref 0–5)
Squamous Epithelial / HPF: NONE SEEN (ref 0–5)

## 2020-12-18 LAB — PHOSPHORUS: Phosphorus: 5.1 mg/dL — ABNORMAL HIGH (ref 2.5–4.6)

## 2020-12-18 LAB — HEMOGLOBIN AND HEMATOCRIT, BLOOD
HCT: 24 % — ABNORMAL LOW (ref 39.0–52.0)
Hemoglobin: 7.6 g/dL — ABNORMAL LOW (ref 13.0–17.0)

## 2020-12-18 MED ORDER — INFLUENZA VAC SPLIT QUAD 0.5 ML IM SUSY
0.5000 mL | PREFILLED_SYRINGE | INTRAMUSCULAR | Status: AC
  Administered 2020-12-19: 0.5 mL via INTRAMUSCULAR
  Filled 2020-12-18: qty 0.5

## 2020-12-18 NOTE — Progress Notes (Signed)
PT Cancellation Note  Patient Details Name: Keith [name] MRN: [name] DOB: April 17, 1965   Cancelled Treatment:    Reason Eval/Treat Not Completed: Pain limiting ability to participate pt politely declines secondary to pain- RN made aware. Will attempt to return later if time/schedule allow.   [name], DPT, PN2   Supplemental Physical Therapist [place]    Pager [phone] Acute Rehab Office [phone]

## 2020-12-18 NOTE — Plan of Care (Signed)
  Problem: Clinical Measurements: Goal: Ability to avoid or minimize complications of infection will improve Outcome: Not Progressing   Problem: Skin Integrity: Goal: Skin integrity will improve Outcome: Not Progressing

## 2020-12-18 NOTE — Progress Notes (Signed)
PROGRESS NOTE    Keith [name]  [id number] DOB: 1965-11-27 DOA: 10/17/2020 [name], NP   Chief Complaint  Patient presents with   Foot Ulcer   Brief Narrative: 55 yo with PMH T2DM, HTN, HLD, CAD, CKD 3B, HIV, s/p R BKA who initially presented on 7/13 with L heel ulceration.  He was seen by [name] and underwent transtibial amputation with application of prevena wound vac on 7/22.  Hospitalization was complicated by AKI and renal was consulted.  AKI was thought to be 2/2 ATN 2/2 hypotension.  He had tunneled HD cath placed in preparation for HD on 7/28.  Hospitalization c/b gross hematuria on 8/4, he underwent cystoscopy, clot evacuation, and fulguration of prostate on 8/4.  Tunneled HD catheter removed on 8/8.  8/10 was noted to have wound dehiscence and bleeding from his L BKA and he had revision of his BKA on 8/12.  Given his bleeding on DAPT with PCI 03/22/20, case was discussed with cardiology who recommended continuing aspirin and holding brilinta until bleeding stops and H/H stable (8/14, cards note).  He went back for revision of L BKA again on 8/27.  Hospitalization complicated recurrent hematuria on 8/22 and again on 9/5.  He had CBI and plan for void trial on 9/7.    Assessment & Plan:   Principal Problem:   AKI (acute kidney injury) ([place]) Active Problems:   Elevated blood pressure reading with diagnosis of hypertension   HIV disease ([place])   Uncontrolled type 2 diabetes mellitus with hyperglycemia ([place])   Cellulitis   Ulcer of heel due to diabetes mellitus ([facility])   Normochromic normocytic anemia   Cellulitis of left foot   Acute osteomyelitis of left calcaneus ([facility])   Dehiscence of amputation stump ([facility])   Hx of BKA, left ([facility])  Gross Hematuria S/p cystoscopy, clot evacuation, fulguration of prostate on 11/09/2020 finasteride Outpatient follow up >700 cc on bladder scan 9/8 -> replace foley catheter 9/8 -- will need outpatient follow up with urology  AKI  on CKD IIIb  Hyperkalemia Baseline creatinine about 2.5 Fluctuating creatinine, has trended up to ~2.87 Renal US repeat without hydro Renal Cs appreciate recs Negative w/u for hydro/acute GN Tunneled dialysis catheter has been removed Renal has now signed off - plan for outpatient nephology follow up (see 8/7 renal note)  Pyuria He's not c/o UTI sx Follow culture Consider treatment if symptosm  Left Heel Osteomyelitis  Recurrent Wound Dehiscence and Bleeding 7/22 transtibial amputation Revision on 8/12 and 8/27 Wound vac removed today per ortho Needs 1 week follow up with orthopedics after discharge  Acute blood loss anemia Continue to monitor, transfuse as indicated Iron def, aocd Hb downtrending today, trend  Uncontrolled T2 DM A1c 13.14 Aug 2020 (question accuracy of 8/24 A1c of 5.9 with blood loss anemia) Jardiance d/c'd per renal - follow  Continue to monitor  CAD s/p Stenting 03/2020 Brilinta discontinued based on discussion with cardiology on 8/14 If pt stabilizes, plan to resume DAPT with aspirin/brilinta when ok per surgery  HLD Vascepa, lipitor  HIV Continue antiretrovirals  Depressed Mood He denies SI.  Declined any medications at this time.  Sounds like this is situational.  Continue to monitor.   DVT prophylaxis: SCD Code Status: full  Family Communication: none at bedside Disposition:   Status is: Inpatient  Remains inpatient appropriate because:Inpatient level of care appropriate due to severity of illness  Dispo:  Patient From:    Planned Disposition: Home with Health  Care Svc  Medically stable for discharge:           Consultants:  Ortho urology  Procedures:  See prior notes and above  Antimicrobials:  Anti-infectives (From admission, onward)    Start     Dose/Rate Route Frequency Ordered Stop   12/02/20 1230  ceFAZolin (ANCEF) IVPB 2g/100 mL premix        2 g 200 mL/hr over 30 Minutes Intravenous Every 8 hours 12/02/20 1134  12/02/20 2245   12/02/20 0600  ceFAZolin (ANCEF) IVPB 3g/100 mL premix        3 g 200 mL/hr over 30 Minutes Intravenous On call to O.R. 12/01/20 1909 12/02/20 0950   12/01/20 0600  ceFAZolin (ANCEF) IVPB 2g/100 mL premix  Status:  Discontinued        2 g 200 mL/hr over 30 Minutes Intravenous On call to O.R. 11/30/20 1817 11/30/20 1819   12/01/20 0600  ceFAZolin (ANCEF) IVPB 3g/100 mL premix        3 g 200 mL/hr over 30 Minutes Intravenous On call to O.R. 11/30/20 1820 12/02/20 0559   11/18/20 0100  ceFAZolin (ANCEF) IVPB 2g/100 mL premix        2 g 200 mL/hr over 30 Minutes Intravenous Every 8 hours 11/17/20 2045 11/18/20 1038   11/17/20 0600  ceFAZolin (ANCEF) IVPB 3g/100 mL premix        3 g 200 mL/hr over 30 Minutes Intravenous On call to O.R. 11/16/20 1941 11/17/20 1901   11/11/20 1200  vancomycin (VANCOREADY) IVPB 500 mg/100 mL  Status:  Discontinued        500 mg 100 mL/hr over 60 Minutes Intravenous Every T-Th-Sa (Hemodialysis) 11/09/20 1414 11/11/20 1328   11/10/20 0000  Dolutegravir-lamiVUDine (DOVATO) 50-300 MG TABS        1 tablet Oral Daily 11/10/20 1048     11/10/20 0000  doravirine (PIFELTRO) 100 MG TABS tablet        100 mg Oral Daily 11/10/20 1048     11/08/20 1000  dolutegravir (TIVICAY) tablet 50 mg        50 mg Oral Daily 11/07/20 1451     11/08/20 1000  lamiVUDine (EPIVIR) tablet 300 mg        300 mg Oral Daily 11/07/20 1451     11/08/20 1000  doravirine (PIFELTRO) tablet 100 mg        100 mg Oral Daily 11/07/20 1451     11/02/20 1130  ceFAZolin (ANCEF) IVPB 2g/100 mL premix        2 g 200 mL/hr over 30 Minutes Intravenous To Radiology 11/02/20 1042 11/02/20 1704   10/27/20 1245  ceFAZolin (ANCEF) IVPB 3g/100 mL premix        3 g 200 mL/hr over 30 Minutes Intravenous On call to O.R. 10/27/20 0755 10/27/20 1608   10/20/20 1415  linezolid (ZYVOX) tablet 600 mg  Status:  Discontinued        600 mg Oral Every 12 hours 10/20/20 1326 10/28/20 1115   10/19/20 1445   metroNIDAZOLE (FLAGYL) tablet 500 mg  Status:  Discontinued        500 mg Oral Every 12 hours 10/19/20 1347 10/28/20 1115   10/18/20 1600  ceFEPIme (MAXIPIME) 2 g in sodium chloride 0.9 % 100 mL IVPB  Status:  Discontinued        2 g 200 mL/hr over 30 Minutes Intravenous Every 12 hours 10/18/20 0414 10/28/20 1112   10/18/20 1000  dapsone tablet 100 mg  100 mg Oral Daily 10/18/20 0425     10/18/20 1000  bictegravir-emtricitabine-tenofovir AF (BIKTARVY) 50-200-25 MG per tablet 1 tablet  Status:  Discontinued        1 tablet Oral Daily 10/18/20 0610 11/07/20 1451   10/18/20 0415  metroNIDAZOLE (FLAGYL) IVPB 500 mg  Status:  Discontinued        500 mg 100 mL/hr over 60 Minutes Intravenous Every 8 hours 10/18/20 0404 10/19/20 1347   10/18/20 0415  ceFEPIme (MAXIPIME) 2 g in sodium chloride 0.9 % 100 mL IVPB        2 g 200 mL/hr over 30 Minutes Intravenous  Once 10/18/20 0414 10/18/20 0500   10/18/20 0345  vancomycin (VANCOREADY) IVPB 2000 mg/400 mL        2,000 mg 200 mL/hr over 120 Minutes Intravenous  Once 10/18/20 0335 10/18/20 0608          Subjective: No complaints today  Objective: Vitals:   12/17/20 1326 12/17/20 2124 12/18/20 0758 12/18/20 1427  BP: 103/78 115/87 129/86 124/88  Pulse: 80 79 75 75  Resp: '17 18 17 17  '$ Temp: 98.2 F (36.8 C) 98.5 F (36.9 C) 98.2 F (36.8 C) (!) 97.4 F (36.3 C)  TempSrc: Oral Oral Oral Oral  SpO2: 100% 99% 97% 96%  Weight:      Height:        Intake/Output Summary (Last 24 hours) at 12/18/2020 1849 Last data filed at 12/18/2020 1500 Gross per 24 hour  Intake --  Output 2050 ml  Net -2050 ml   Filed Weights   11/20/20 0618 11/23/20 0618 11/23/20 2144  Weight: 117.3 kg 121.4 kg 121.4 kg    Examination:  General: No acute distress. Cardiovascular: RRR Lungs: unlabored Abdomen: Soft, nontender, nondistended  Neurological: Alert and oriented 3. Moves all extremities 4 Cranial nerves II through XII grossly intact. Skin:  Warm and dry. No rashes or lesions. Extremities: bilateral BKA   Data Reviewed: I have personally reviewed following labs and imaging studies  CBC: Recent Labs  Lab 12/13/20 0123 12/14/20 0222 12/15/20 0112 12/16/20 0100 12/17/20 0308 12/18/20 0046  WBC 9.3 7.7 11.7* 10.3 10.3 8.5  NEUTROABS 6.7 5.2 9.1* 7.3 7.0  --   HGB 7.8* 7.9* 8.1* 7.7* 7.5* 7.2*  HCT 24.7* 25.3* 25.9* 24.5* 24.5* 23.6*  MCV 93.2 92.0 93.2 93.5 93.5 94.0  PLT 360 392 385 382 375 [id number]    Basic Metabolic Panel: Recent Labs  Lab 12/14/20 0222 12/15/20 0112 12/15/20 1929 12/16/20 0100 12/17/20 0308 12/18/20 0046  NA 137  137 137 136 136 135 135  K 5.1  5.1 5.4* 5.0 4.9 5.0 5.2*  CL 106  105 106 104 105 102 103  CO2 '24  23 24 24 24 23 24  '$ GLUCOSE 113*  112* 110* 105* 104* 104* 102*  BUN 26*  26* 27* 31* 31* 34* 34*  CREATININE 1.92*  1.98* 2.18* 2.50* 2.55* 2.70* 2.87*  CALCIUM 9.2  9.2 9.2 9.1 9.1 8.9 8.9  MG 2.0 2.2  --  2.3 2.2 2.2  PHOS 4.7*  4.7* 5.0*  --  5.3* 5.5* 5.1*    GFR: Estimated Creatinine Clearance: 37.6 mL/min (Doyle Kunath) (by C-G formula based on SCr of 2.87 mg/dL (H)).  Liver Function Tests: Recent Labs  Lab 12/13/20 0123 12/14/20 0222 12/15/20 0112 12/16/20 0100 12/17/20 0308  AST  --  21  --   --  24  ALT  --  30  --   --  23  ALKPHOS  --  116  --   --  110  BILITOT  --  0.5  --   --  0.6  PROT  --  7.0  --   --  7.2  ALBUMIN 2.3* 2.4*  2.4* 2.5* 2.3* 2.4*    CBG: Recent Labs  Lab 12/11/20 2052 12/15/20 1154  GLUCAP 105* 147*     Recent Results (from the past 240 hour(s))  SARS CORONAVIRUS 2 (TAT 6-24 HRS) Nasopharyngeal Nasopharyngeal Swab     Status: None   Collection Time: 12/17/20 12:25 PM   Specimen: Nasopharyngeal Swab  Result Value Ref Range Status   SARS Coronavirus 2 NEGATIVE NEGATIVE Final    Comment: (NOTE) SARS-CoV-2 target nucleic acids are NOT DETECTED.  The SARS-CoV-2 RNA is generally detectable in upper and lower respiratory  specimens during the acute phase of infection. Negative results do not preclude SARS-CoV-2 infection, do not rule out co-infections with other pathogens, and should not be used as the sole basis for treatment or other patient management decisions. Negative results must be combined with clinical observations, patient history, and epidemiological information. The expected result is Negative.  Fact Sheet for Patients: SugarRoll.be  Fact Sheet for Healthcare Providers: https://www.woods-mathews.com/  This test is not yet approved or cleared by the [place] FDA and  has been authorized for detection and/or diagnosis of SARS-CoV-2 by FDA under an Emergency Use Authorization (EUA). This EUA will remain  in effect (meaning this test can be used) for the duration of the COVID-19 declaration under Se ction 564(b)(1) of the Act, 21 U.S.C. section 360bbb-3(b)(1), unless the authorization is terminated or revoked sooner.  Performed at [facility], [place] [address] [place], [place] [address]          Radiology Studies: US RENAL  Result Date: 12/18/2020 CLINICAL DATA:  Acute renal insufficiency EXAM: RENAL / URINARY TRACT ULTRASOUND COMPLETE COMPARISON:  12/11/2020 FINDINGS: Right Kidney: Renal measurements: 11.7 x 6.0 x 4.8 cm = volume: 175 mL. Renal cortical echogenicity is diffusely mildly increased, in keeping with changes of underlying medical renal disease, unchanged from prior examination. Cortical thickness is preserved. No hydronephrosis. No intrarenal masses or calcifications are seen. Left Kidney: Renal measurements: 11.4 x 5.1 x 5.3 cm = volume: 160 mL. Similarly, renal cortical echogenicity is mildly increased, similar to prior examination, in keeping with underlying medical renal disease. Cortical thickness is preserved. No hydronephrosis. No intrarenal masses or calcifications are seen. Bladder: Foley catheter balloon is seen within  [place] decompressed bladder lumen. Other: None. IMPRESSION: Stable mild cortical hyperechogenicity in keeping with underlying medical renal disease. No hydronephrosis. Electronically Signed   By: Fidela Salisbury M.D.   On: 12/18/2020 16:07        Scheduled Meds:  alfuzosin  10 mg Oral Q breakfast   allopurinol  100 mg Oral Daily   vitamin C  1,000 mg Oral Daily   aspirin EC  81 mg Oral Daily   atorvastatin  80 mg Oral Daily   Chlorhexidine Gluconate Cloth  6 each Topical Daily   dapsone  100 mg Oral Daily   docusate sodium  100 mg Oral Daily   dolutegravir  50 mg Oral Daily   doravirine  100 mg Oral Daily   escitalopram  20 mg Oral Daily   finasteride  5 mg Oral Daily   gabapentin  300 mg Oral TID   icosapent Ethyl  2 g Oral BID   [START ON 12/19/2020] influenza vac  split quadrivalent PF  0.5 mL Intramuscular Tomorrow-1000   lamiVUDine  300 mg Oral Daily   multivitamin with minerals  1 tablet Oral Q24H   oxybutynin  2.5 mg Oral BID   oxyCODONE  10 mg Oral QID   pantoprazole  40 mg Oral Daily   polyethylene glycol  17 g Oral BID   senna  2 tablet Oral QHS   Continuous Infusions:  sodium chloride 50 mL/hr at 12/13/20 0830   magnesium sulfate bolus IVPB       LOS: 61 days    Time spent: over 30 min    Fayrene Helper, MD [facility]   To contact the attending provider between 7A-7P or the covering provider during after hours 7P-7A, please log into the web site [email or web address] and access using universal [place] password for that web site. If you do not have the password, please call the hospital operator.  12/18/2020, 6:49 PM

## 2020-12-18 NOTE — Progress Notes (Signed)
[name], male   DOB: January 31, 1966, 55 y.o.   MRN: [name] [facility] Progress Note   Assessment/ Plan:   Brief Narrative: 55 year old male with a past medical history of CKD 3B, DM 2, hypertension, CAD, HIV, history of right BKA who presented with a left heel ulceration, underwent transtibial amputation with wound VAC on 7/22.  His hospital course was complicated by AKI thought to be secondary to ATN from hypotension.  He was prepped for hemodialysis therefore tunneled dialysis catheter was placed on 7/28.  He started to have gross hematuria on 8/4 and underwent cystoscopy/clot evacuation/fulguration of prostate on 8/4.  He did not require dialysis and his tunneled dialysis catheter was removed on 8/8 and we had signed off on 8/7.  Thereafter, he was noted to have wound dehiscence and bleeding from his left BKA and therefore it was revised on 8/12.  He went back for another revision on 8/27.  He had recurrent hematuria on 8/22 and again on a 9/5.  He had CBI on 9/7.   Did have an episode of hypotension on 9/10.  Foley was replaced on 9/8 after failing TOV.  Renal ultrasound on 9/5 without any acute abnormalities. We were reconsulted on 9/12 for AKI again.   1. Acute kidney Injury on chronic kidney disease stage IIIb (baseline creatinine about 2.5): initial injury thought to be ATN from hypotension, did not require HD, TDC removed by IR on 11/13/20. Renal function improved/plateaued. His prior work-up was negative for hydronephrosis/acute GN.   Suspecting his AKI is related to ATN/hemodynamic injury from 9/10 (low BP) and hematuria. He is non-oliguric. Will recheck ultrasound and repeat UA w/ microscopy. Stopping jardiance. 2.  Bladder outlet obstruction/gross hematuria, recurrent: Underwent cystoscopy this admission with evacuation of blood clots from his bladder and fulguration of prostate (source=prostate).  S/p CBI. Foley replaced on 9/8. Repeat renal u/s today 3.  Left  foot osteomyelitis status post BKA: per ortho and primary 4.  Normocytic anemia: Compounded by underlying chronic kidney disease and recent hematuria. Transfuse prn 5. DM2, uncontrolled: per primary, stopping jardiance as above  Gean Quint, MD [facility]   Subjective:   Patient offers no complaints today. Doing well with foley catheter. Denies fevers, chills, chest pain, SOB, n/v/d, dizziness   Objective:   BP 129/86 (BP Location: Left Arm)   Pulse 75   Temp 98.2 F (36.8 C) (Oral)   Resp 17   Ht '5\' 10"'$  (1.778 m)   Wt 121.4 kg   SpO2 97%   BMI 38.40 kg/m   Intake/Output Summary (Last 24 hours) at 12/18/2020 0836 Last data filed at 12/17/2020 1700 Gross per 24 hour  Intake 1080 ml  Output 1000 ml  Net 80 ml   Weight change:   Physical Exam: Gen: nad, sitting up in bed CVS: s1s2, rrr Resp: Clear to auscultation anteriorly bilaterally, no rales/rhonchi Abd: Soft, obese, nontender, bowel sounds normal Ext: b/l BKA Neuro: no focal deficits, no tremors/asterixis GU: +foley w/ yellow & slightly cloudy urine HD access: none  Imaging: No results found.  Labs: BMET Recent Labs  Lab 12/12/20 0256 12/13/20 0123 12/14/20 0222 12/15/20 0112 12/15/20 1929 12/16/20 0100 12/17/20 0308 12/18/20 0046  NA 137 134* 137  137 137 136 136 135 135  K 5.0 5.3* 5.1  5.1 5.4* 5.0 4.9 5.0 5.2*  CL 106 104 106  105 106 104 105 102 103  CO2 '24 22 24  23 24 24 24 '$ 23  24  GLUCOSE 107* 132* 113*  112* 110* 105* 104* 104* 102*  BUN 32* 32* 26*  26* 27* 31* 31* 34* 34*  CREATININE 2.12* 2.11* 1.92*  1.98* 2.18* 2.50* 2.55* 2.70* 2.87*  CALCIUM 9.1 8.9 9.2  9.2 9.2 9.1 9.1 8.9 8.9  PHOS 5.0* 4.8* 4.7*  4.7* 5.0*  --  5.3* 5.5* 5.1*   CBC Recent Labs  Lab 12/14/20 0222 12/15/20 0112 12/16/20 0100 12/17/20 0308 12/18/20 0046  WBC 7.7 11.7* 10.3 10.3 8.5  NEUTROABS 5.2 9.1* 7.3 7.0  --   HGB 7.9* 8.1* 7.7* 7.5* 7.2*  HCT 25.3* 25.9* 24.5* 24.5* 23.6*  MCV  92.0 93.2 93.5 93.5 94.0  PLT 392 385 382 375 396    Medications:     alfuzosin  10 mg Oral Q breakfast   allopurinol  100 mg Oral Daily   vitamin C  1,000 mg Oral Daily   aspirin EC  81 mg Oral Daily   atorvastatin  80 mg Oral Daily   Chlorhexidine Gluconate Cloth  6 each Topical Daily   dapsone  100 mg Oral Daily   docusate sodium  100 mg Oral Daily   dolutegravir  50 mg Oral Daily   doravirine  100 mg Oral Daily   empagliflozin  10 mg Oral Daily   escitalopram  20 mg Oral Daily   finasteride  5 mg Oral Daily   gabapentin  300 mg Oral TID   icosapent Ethyl  2 g Oral BID   lamiVUDine  300 mg Oral Daily   multivitamin with minerals  1 tablet Oral Q24H   oxybutynin  2.5 mg Oral BID   oxyCODONE  10 mg Oral QID   pantoprazole  40 mg Oral Daily   polyethylene glycol  17 g Oral BID   senna  2 tablet Oral QHS    Elmarie Shiley, MD 12/18/2020, 8:36 AM

## 2020-12-18 NOTE — Progress Notes (Signed)
PT Cancellation Note  Patient Details Name: Keith [name] MRN: [name] DOB: [birth year]   Cancelled Treatment:    Reason Eval/Treat Not Completed: Patient declined, no reason specified continues to decline PT due to pain even after receiving medication from nursing. Politely declines all activities today, asks we return tomorrow. Will continue efforts.   [name], DPT, PN2   Supplemental Physical Therapist [place]    Pager [phone] Acute Rehab Office [phone]

## 2020-12-18 NOTE — Plan of Care (Signed)
  Problem: Clinical Measurements: Goal: Ability to avoid or minimize complications of infection will improve Outcome: Adequate for Discharge   Problem: Skin Integrity: Goal: Skin integrity will improve Outcome: Adequate for Discharge   Problem: Clinical Measurements: Goal: Ability to maintain clinical measurements within normal limits will improve Outcome: Adequate for Discharge

## 2020-12-19 DIAGNOSIS — N179 Acute kidney failure, unspecified: Secondary | ICD-10-CM | POA: Diagnosis not present

## 2020-12-19 LAB — GLUCOSE, CAPILLARY
Glucose-Capillary: 104 mg/dL — ABNORMAL HIGH (ref 70–99)
Glucose-Capillary: 112 mg/dL — ABNORMAL HIGH (ref 70–99)
Glucose-Capillary: 121 mg/dL — ABNORMAL HIGH (ref 70–99)
Glucose-Capillary: 151 mg/dL — ABNORMAL HIGH (ref 70–99)

## 2020-12-19 LAB — RENAL FUNCTION PANEL
Albumin: 2.5 g/dL — ABNORMAL LOW (ref 3.5–5.0)
Anion gap: 9 (ref 5–15)
BUN: 33 mg/dL — ABNORMAL HIGH (ref 6–20)
CO2: 26 mmol/L (ref 22–32)
Calcium: 9.1 mg/dL (ref 8.9–10.3)
Chloride: 101 mmol/L (ref 98–111)
Creatinine, Ser: 2.53 mg/dL — ABNORMAL HIGH (ref 0.61–1.24)
GFR, Estimated: 29 mL/min — ABNORMAL LOW (ref >60)
Glucose, Bld: 105 mg/dL — ABNORMAL HIGH (ref 70–99)
Phosphorus: 4.9 mg/dL — ABNORMAL HIGH (ref 2.5–4.6)
Potassium: 4.9 mmol/L (ref 3.5–5.1)
Sodium: 136 mmol/L (ref 135–145)

## 2020-12-19 LAB — CBC
HCT: 23.8 % — ABNORMAL LOW (ref 39.0–52.0)
Hemoglobin: 7.5 g/dL — ABNORMAL LOW (ref 13.0–17.0)
MCH: 29.4 pg (ref 26.0–34.0)
MCHC: 31.5 g/dL (ref 30.0–36.0)
MCV: 93.3 fL (ref 80.0–100.0)
Platelets: 392 10*3/uL (ref 150–400)
RBC: 2.55 MIL/uL — ABNORMAL LOW (ref 4.22–5.81)
RDW: 14.8 % (ref 11.5–15.5)
WBC: 7.3 10*3/uL (ref 4.0–10.5)
nRBC: 0 % (ref 0.0–0.2)

## 2020-12-19 MED ORDER — ALLOPURINOL 100 MG PO TABS: 100.0000 mg | ORAL_TABLET | Freq: Every day | ORAL | 0 refills | Status: DC

## 2020-12-19 MED ORDER — FINASTERIDE 5 MG PO TABS: 5.0000 mg | ORAL_TABLET | Freq: Every day | ORAL | 0 refills | Status: DC

## 2020-12-19 MED ORDER — OXYBUTYNIN CHLORIDE 5 MG PO TABS: 2.5000 mg | ORAL_TABLET | Freq: Two times a day (BID) | ORAL | Status: DC

## 2020-12-19 MED ORDER — ALFUZOSIN HCL ER 10 MG PO TB24: 10.0000 mg | ORAL_TABLET | Freq: Every day | ORAL | 0 refills | Status: DC

## 2020-12-19 MED ORDER — PANTOPRAZOLE SODIUM 40 MG PO TBEC: 40.0000 mg | DELAYED_RELEASE_TABLET | Freq: Every day | ORAL | 0 refills | Status: DC

## 2020-12-19 MED ORDER — CARVEDILOL 3.125 MG PO TABS: 3.1250 mg | ORAL_TABLET | Freq: Two times a day (BID) | ORAL | 0 refills | Status: DC

## 2020-12-19 MED ORDER — FERROUS SULFATE 325 (65 FE) MG PO TABS: 325.0000 mg | ORAL_TABLET | Freq: Every day | ORAL | 11 refills | Status: DC

## 2020-12-19 NOTE — Progress Notes (Signed)
[name], male   DOB: Aug 08, 1965, 55 y.o.   MRN: [name] [facility] Progress Note   Assessment/ Plan:   Brief Narrative: 55 year old male with a past medical history of CKD 3B, DM 2, hypertension, CAD, HIV, history of right BKA who presented with a left heel ulceration, underwent transtibial amputation with wound VAC on 7/22.  His hospital course was complicated by AKI thought to be secondary to ATN from hypotension.  He was prepped for hemodialysis therefore tunneled dialysis catheter was placed on 7/28.  He started to have gross hematuria on 8/4 and underwent cystoscopy/clot evacuation/fulguration of prostate on 8/4.  He did not require dialysis and his tunneled dialysis catheter was removed on 8/8 and we had signed off on 8/7.  Thereafter, he was noted to have wound dehiscence and bleeding from his left BKA and therefore it was revised on 8/12.  He went back for another revision on 8/27.  He had recurrent hematuria on 8/22 and again on a 9/5.  He had CBI on 9/7.   Did have an episode of hypotension on 9/10.  Foley was replaced on 9/8 after failing TOV.  Renal ultrasound on 9/5 without any acute abnormalities. We were reconsulted on 9/12 for AKI again.   1. Acute kidney Injury on chronic kidney disease stage IIIb (baseline creatinine about 2.5): initial injury thought to be ATN from hypotension, did not require HD, TDC removed by IR on 11/13/20. Renal function improved/plateaued. His prior work-up was negative for hydronephrosis/acute GN.   Suspecting his AKI is related to ATN/hemodynamic injury from 9/10 (low BP) and hematuria. He is non-oliguric. Stopped jardiance, ultrasound without any acute changers, dirty ua suggestive of UTI--per primary. Cr improved, back down towards baseline of 2.5. Would be okay with discharge to facility from a nephrology perspective 2.  Bladder outlet obstruction/gross hematuria, recurrent: Underwent cystoscopy this admission with evacuation  of blood clots from his bladder and fulguration of prostate (source=prostate).  S/p CBI. Foley replaced on 9/8. Repeat renal u/s without hydronephrosis 3.  Left foot osteomyelitis status post BKA: per ortho and primary 4.  Normocytic anemia: Compounded by underlying chronic kidney disease and recent hematuria. Transfuse prn 5. DM2, uncontrolled: per primary, stopped jardiance as above  Case discussed with primary service.  Gean Quint, MD [facility]   Subjective:   Patient offers no complaints today. No complaints. He is eager to get out of the hospital.   Objective:   BP 109/81 (BP Location: Left Arm)   Pulse 75   Temp 98 F (36.7 C) (Oral)   Resp 15   Ht '5\' 10"'$  (1.778 m)   Wt 121.4 kg   SpO2 95%   BMI 38.40 kg/m   Intake/Output Summary (Last 24 hours) at 12/19/2020 1001 Last data filed at 12/18/2020 1500 Gross per 24 hour  Intake --  Output 950 ml  Net -950 ml   Weight change:   Physical Exam: Gen: nad, sitting up in chair CVS: s1s2, rrr Resp: Clear to auscultation anteriorly bilaterally, no rales/rhonchi Abd: Soft, obese, nontender, bowel sounds normal Ext: b/l BKA Neuro: no focal deficits, no tremors/asterixis GU: +foley w/ yellow & slightly cloudy urine HD access: none  Imaging: US RENAL  Result Date: 12/18/2020 CLINICAL DATA:  Acute renal insufficiency EXAM: RENAL / URINARY TRACT ULTRASOUND COMPLETE COMPARISON:  12/11/2020 FINDINGS: Right Kidney: Renal measurements: 11.7 x 6.0 x 4.8 cm = volume: 175 mL. Renal cortical echogenicity is diffusely mildly increased, in keeping with  changes of underlying medical renal disease, unchanged from prior examination. Cortical thickness is preserved. No hydronephrosis. No intrarenal masses or calcifications are seen. Left Kidney: Renal measurements: 11.4 x 5.1 x 5.3 cm = volume: 160 mL. Similarly, renal cortical echogenicity is mildly increased, similar to prior examination, in keeping with underlying medical  renal disease. Cortical thickness is preserved. No hydronephrosis. No intrarenal masses or calcifications are seen. Bladder: Foley catheter balloon is seen within a decompressed bladder lumen. Other: None. IMPRESSION: Stable mild cortical hyperechogenicity in keeping with underlying medical renal disease. No hydronephrosis. Electronically Signed   By: Fidela Salisbury M.D.   On: 12/18/2020 16:07    Labs: BMET Recent Labs  Lab 12/13/20 0123 12/14/20 0222 12/15/20 0112 12/15/20 1929 12/16/20 0100 12/17/20 0308 12/18/20 0046 12/19/20 0720  NA 134* 137  137 137 136 136 135 135 136  K 5.3* 5.1  5.1 5.4* 5.0 4.9 5.0 5.2* 4.9  CL 104 106  105 106 104 105 102 103 101  CO2 '22 24  23 24 24 24 23 24 26  '$ GLUCOSE 132* 113*  112* 110* 105* 104* 104* 102* 105*  BUN 32* 26*  26* 27* 31* 31* 34* 34* 33*  CREATININE 2.11* 1.92*  1.98* 2.18* 2.50* 2.55* 2.70* 2.87* 2.53*  CALCIUM 8.9 9.2  9.2 9.2 9.1 9.1 8.9 8.9 9.1  PHOS 4.8* 4.7*  4.7* 5.0*  --  5.3* 5.5* 5.1* 4.9*   CBC Recent Labs  Lab 12/14/20 0222 12/15/20 0112 12/16/20 0100 12/17/20 0308 12/18/20 0046 12/18/20 2003 12/19/20 0720  WBC 7.7 11.7* 10.3 10.3 8.5  --  7.3  NEUTROABS 5.2 9.1* 7.3 7.0  --   --   --   HGB 7.9* 8.1* 7.7* 7.5* 7.2* 7.6* 7.5*  HCT 25.3* 25.9* 24.5* 24.5* 23.6* 24.0* 23.8*  MCV 92.0 93.2 93.5 93.5 94.0  --  93.3  PLT 392 385 382 375 396  --  392    Medications:     alfuzosin  10 mg Oral Q breakfast   allopurinol  100 mg Oral Daily   vitamin C  1,000 mg Oral Daily   aspirin EC  81 mg Oral Daily   atorvastatin  80 mg Oral Daily   Chlorhexidine Gluconate Cloth  6 each Topical Daily   dapsone  100 mg Oral Daily   docusate sodium  100 mg Oral Daily   dolutegravir  50 mg Oral Daily   doravirine  100 mg Oral Daily   escitalopram  20 mg Oral Daily   finasteride  5 mg Oral Daily   gabapentin  300 mg Oral TID   icosapent Ethyl  2 g Oral BID   influenza vac split quadrivalent PF  0.5 mL Intramuscular  Tomorrow-1000   lamiVUDine  300 mg Oral Daily   multivitamin with minerals  1 tablet Oral Q24H   oxybutynin  2.5 mg Oral BID   oxyCODONE  10 mg Oral QID   pantoprazole  40 mg Oral Daily   polyethylene glycol  17 g Oral BID   senna  2 tablet Oral QHS    Gean Quint, MD [facility] 12/19/2020, 10:01 AM

## 2020-12-19 NOTE — Progress Notes (Signed)
Occupational Therapy Treatment Patient Details Name: Keith [name] MRN: [id number] DOB: 09/08/1965 Today's Date: 12/19/2020   History of present illness 55 yo male admitted 10/17/20 after being sent to ED by his orthopedic provider with concerns for L heel ulcer.  He has failed conservative measure and presents with s/p L BKA on 10/27/20. s/p revision to L BKA on 11/17/20 & again on 12/02/20.  PMH: HIV, CKD 3, DM type 2, HTN, HLD, CAD, R BKA.   OT comments  Pt seated in recliner, agreeable to therapy session.  Requires total assist for limb protector donning and mgmt, pt with poor awareness to use and requires max encouragement to wear.  Dons R prosthetic with min assist.  Lateral scoot transfers with min guard for safety, attempted sit to stand with min guard +2 but unable to reach full stand.  Pt declines use of gait belt or physical assist for any ADL/mobility task, very limiting in acceptance of education and becomes agitated with cueing. Poor safety awareness and problem solving throughout session.  Setup for UB dressing.  Declined UB exercises as reports "my arms are good". Continue to recommend SNF. Will follow acutely.    Recommendations for follow up therapy are one component of a multi-disciplinary discharge planning process, led by the attending physician.  Recommendations may be updated based on patient status, additional functional criteria and insurance authorization.    Follow Up Recommendations  SNF    Equipment Recommendations  None recommended by OT    Recommendations for Other Services      Precautions / Restrictions Precautions Precautions: Fall Precaution Comments: B BKA's: R healed,  L BKA rev 8/27, RLE prosthesis Required Braces or Orthoses: Other Brace Other Brace: L limb protector to be worn when up and OOB - pt is non compliant with use. Restrictions Weight Bearing Restrictions: No RLE Weight Bearing: Weight bearing as tolerated LLE Weight Bearing: Non weight  bearing       Mobility Bed Mobility               General bed mobility comments: up in recliner upon entry    Transfers Overall transfer level: Needs assistance Equipment used: Rolling walker (2 wheeled) Transfers: Lateral/Scoot Transfers;Sit to/from Stand Sit to Stand: Min guard;+2 safety/equipment;From elevated surface        Lateral/Scoot Transfers: Min guard General transfer comment: made multiple attempts at sit to stand from recliner and elevated bed. Unable to get to full standing position even from elevated bed but he also refused to let therapist apply gait belt or physically assist in any way, becoming agitated at all attempts. Able to perform lateral scoots on a min guard basis although very unsafely with posterior lean/unsteadiness and poor sequencing/hand placement.    Balance Overall balance assessment: Needs assistance Sitting-balance support: No upper extremity supported;Feet supported Sitting balance-Leahy Scale: Fair     Standing balance support: Bilateral upper extremity supported;During functional activity Standing balance-Leahy Scale: Zero Standing balance comment: unable to get up to full standing position even with BUE support on RW                           ADL either performed or assessed with clinical judgement   ADL Overall ADL's : Needs assistance/impaired                 Upper Body Dressing : Set up;Sitting   Lower Body Dressing: Maximal assistance;Sitting/lateral leans Lower Body Dressing Details (indicate cue  type and reason): pt requires total assist to don L LE protector, min assist to don R prosthetic.  Lateral leans with min guard for simulated dressing Toilet Transfer: Min Psychiatric nurse Details (indicate cue type and reason): lateral scoot, simulated to w/c         Functional mobility during ADLs: Min guard;+2 for safety/equipment       Vision       Perception     Praxis      Cognition  Arousal/Alertness: Awake/alert Behavior During Therapy: Agitated;Impulsive Overall Cognitive Status: Impaired/Different from baseline Area of Impairment: Attention;Memory;Following commands;Safety/judgement;Awareness;Problem solving                   Current Attention Level: Sustained Memory: Decreased recall of precautions;Decreased short-term memory Following Commands: Follows one step commands inconsistently;Follows one step commands with increased time Safety/Judgement: Decreased awareness of safety;Decreased awareness of deficits Awareness: Emergent Problem Solving: Slow processing;Decreased initiation;Difficulty sequencing;Requires verbal cues;Requires tactile cues General Comments: pt with decreased insight to safety and deficits, pt declines use of gait belt or hands on assist. Easily agitated, and not open to education or suggestions.  Self limiting at times.        Exercises     Shoulder Instructions       General Comments limb protector fell off multiple times during session despite therapist reapplying it    Pertinent Vitals/ Pain       Pain Assessment: Faces Faces Pain Scale: Hurts a little bit Pain Location: L residual limb Pain Descriptors / Indicators: Guarding;Discomfort Pain Intervention(s): Limited activity within patient's tolerance;Monitored during session;Repositioned  Home Living                                          Prior Functioning/Environment              Frequency  Min 2X/week        Progress Toward Goals  OT Goals(current goals can now be found in the care plan section)  Progress towards OT goals: Progressing toward goals  Acute Rehab OT Goals Patient Stated Goal: go home OT Goal Formulation: With patient  Plan Discharge plan remains appropriate;Frequency remains appropriate    Co-evaluation    PT/OT/SLP Co-Evaluation/Treatment: Yes     OT goals addressed during session: Other (comment) (pt  participation/safety)      AM-PAC OT "6 Clicks" Daily Activity     Outcome Measure   Help from another person eating meals?: None Help from another person taking care of personal grooming?: A Little Help from another person toileting, which includes using toliet, bedpan, or urinal?: A Lot Help from another person bathing (including washing, rinsing, drying)?: A Lot Help from another person to put on and taking off regular upper body clothing?: A Little Help from another person to put on and taking off regular lower body clothing?: A Lot 6 Click Score: 16    End of Session Equipment Utilized During Treatment: Other (comment);Rolling walker (w/c, limb protector)  OT Visit Diagnosis: Other abnormalities of gait and mobility (R26.89);Muscle weakness (generalized) (M62.81);Pain Pain - Right/Left: Left Pain - part of body: Leg   Activity Tolerance Patient tolerated treatment well   Patient Left in chair;with call bell/phone within reach;with chair alarm set;with family/visitor present;with nursing/sitter in room   Nurse Communication Mobility status        Time: [id number] OT Time Calculation (min): 52  min  Charges: OT General Charges $OT Visit: 1 Visit OT Treatments $Self Care/Home Management : 8-22 mins  [place] Pager [phone] Office [phone]   Delight Stare 12/19/2020, 1:24 PM

## 2020-12-19 NOTE — Progress Notes (Signed)
Plan was to discharge to SNF - unfortunately he's been declined (see social work note from 9/13 at 3:26 PM).  Not safe for discharge home independently at this point, needs 24 hr care.  Will need to continue to work with [facility] team to facilitate safe discharge.    Of note, in anticipation that he would discharge to SNF, I wrote his allopurinol, protonix, alfuzosin, proscar, ditropan, and ferrous sulfate as no print.  If he goes home, will need prescriptions for these meds (of note his new HIV meds - dovato, pifeltro appear to have been sent in the normal fashion).    See d/c summary from today

## 2020-12-19 NOTE — TOC Progression Note (Addendum)
Transition of Care [facility]) - Progression Note    Patient Details  Name: Keith [name] MRN: [id number] Date of Birth: 01/07/1966  Transition of [facility]) CM/SW Contact  Sharin Mons, RN Phone Number: 12/19/2020, 4:02 PM  Clinical Narrative:    NCM spoke with pt regarding [facility] unable to accept for ST rehab. Pt states resides with mom, however, mom has poor health and is unable to assist or care for him if d/c to home.  NCM given verbal consent to speak with pt's mom concerning transitional care needs by pt. NCM called pt's mom. Call unsuccessful, voice message left. States he has no family or friend support.   [facility] agency accepted pt for home health services, RN, PT,OT ,NA, SW pending MD's order. Pt with medicaid, per [facility]'s admission liaison, they are going to reach out to pt's PCP for [facility] consult, hoping to get PCS hrs for pt.  NCM made referral with [facility] for community support.  TOC will continue to monitor and assist with needs.....  12/19/2020  1710 Received call from pt's mom Keith [name]. NCM share with pt's mom  pt unable to transition to [facility].and pt would probably d/c to home with home health services. Keith [name] shared she doesn't work ...goes to school 4 x/ week from 8:30 am -1pm. States she has another son Keith [name] who lives within the home and works from  2pm-10:30 pm. States if pt not going to SNF he will need hospital bed and 3 in 1/ [facility], MD made aware.    Expected Discharge Plan: [place] (vs SNF) Barriers to Discharge: No Barriers Identified  Expected Discharge Plan and Services Expected Discharge Plan: [place] (vs SNF) In-house Referral: Clinical Social Work Discharge Planning Services: CM Consult Post Acute Care Choice: [place] arrangements for the past 2 months: [place] Expected Discharge Date: 12/19/20                         HH Arranged: RN, Disease  Management, PT, OT, Nurse's Aide, Social Work [employer] Agency: [place] Date [place]: 12/14/20 Time [place]: [phone] Representative spoke with at [place]: [place] ([place]) Interventions    Readmission Risk Interventions Readmission Risk Prevention Plan 10/21/2020 11/13/2018 11/13/2018  Transportation Screening Complete - Complete  PCP or Specialist Appt within 3-5 Days - - -  HRI or [place] Work Consult for [place] - - -  Medication Review Press photographer) Complete - Complete  PCP or Specialist appointment within 3-5 days of discharge Complete Complete -  [place] or Home Care Consult Complete Complete -  SW Recovery Care/Counseling Consult Complete Complete -  Palliative Care Screening Not Applicable Not Applicable -  [place] Complete Not Applicable -  Some encounter information is confidential and restricted. Go to Review Flowsheets activity to see all data.  Some recent data might be hidden

## 2020-12-19 NOTE — TOC Transition Note (Deleted)
Transition of Care [facility]) - CM/SW Discharge Note   Patient Details  Name: Keith [name] MRN: [id number] Date of Birth: 11/26/1965  Transition of Care [facility]) CM/SW Contact:  Amador Cunas, [place] Phone Number: 12/19/2020, 2:01 PM   Clinical Narrative:   Pt for dc to [facility] today. Spoke to [place] at [place] who confirmed they are prepared to admit pt to room 164. [facility] arranged for transport and RN provided with number for report. SW signing off at dc.       Final next level of care: Skilled Nursing Facility Barriers to Discharge: No Barriers Identified   Patient Goals and CMS Choice Patient states their goals for this hospitalization and ongoing recovery are:: snf CMS Medicare.gov Compare Post Acute Care list provided to:: Patient Choice offered to / list presented to : Patient  Discharge Placement              Patient chooses bed at: Other - please specify in the comment section below: ([facility]) Patient to be transferred to facility by: [place] Name of family member notified: Pt Ox4 Patient and family notified of of transfer: 12/19/20  Discharge Plan and Services In-house Referral: Clinical Social Work Discharge Planning Services: CM Consult Post Acute Care Choice: [place]: RN, Disease Management, PT, OT, Nurse's Aide, Social Work [employer] Agency: [place] Date [place]: 12/14/20 Time [place]: [phone] Representative spoke with at [place]: [place] ([place]) Interventions     Readmission Risk Interventions Readmission Risk Prevention Plan 10/21/2020 11/13/2018 11/13/2018  Transportation Screening Complete - Complete  PCP or Specialist Appt within 3-5 Days - - -  HRI or [place] for [place] - - -  Medication Review Human resources officer) Complete - Complete  PCP or Specialist appointment within 3-5 days of discharge Complete Complete -  [place] or Home Care Consult Complete Complete -  SW Recovery Care/Counseling Consult Complete Complete -  Palliative Care Screening Not Applicable Not Applicable -  [place] Complete Not Applicable -  Some encounter information is confidential and restricted. Go to Review Flowsheets activity to see all data.  Some recent data might be hidden

## 2020-12-19 NOTE — Social Work (Signed)
Call received from [place] with [facility] who reports per their administrator, they are unable to accept pt as he was apparently "smoking weed and brought in prostitutes" to the facility on two separate occasions. IF we are able to locate another SNF bed, will need new auth. Anticipate need for dc home with HH. MD and CM updated. [facility] transport cancelled.   Wandra Feinstein, MSW, LCSW [phone] (coverage)

## 2020-12-19 NOTE — Progress Notes (Signed)
Physical Therapy Treatment Patient Details Name: Keith [name] MRN: [id number] DOB: 1965/09/28 Today's Date: 12/19/2020   History of Present Illness 55 yo male admitted 10/17/20 after being sent to ED by his orthopedic provider with concerns for L heel ulcer.  He has failed conservative measure and presents with s/p L BKA on 10/27/20. s/p revision to L BKA on 11/17/20 & again on 12/02/20.  PMH: HIV, CKD 3, DM type 2, HTN, HLD, CAD, R BKA.    PT Comments    Patient received in recliner, asking to go outside with staff. Able to apply prosthetic with MinA and open to trying to stand-pivot to [facility] today. Became agitated and refused to allow therapist to apply gait belt or touch him even when no one was attempting to touch him; also often said he wanted to try something and would unpredictably change his mind as to what he was open to doing today. Ultimately ended up getting into [facility] with lateral scoots with min guard assist, but with poor safety awareness and unsafe technique. Not open or accepting of cues for safety or sequencing. Able to self propel WC about 189f with with multiple seated rest breaks. Left up in recliner with all needs met, chair alarm active, family present. Will continue efforts, for now continue to recommend SNF.    Recommendations for follow up therapy are one component of a multi-disciplinary discharge planning process, led by the attending physician.  Recommendations may be updated based on patient status, additional functional criteria and insurance authorization.  Follow Up Recommendations  SNF     Equipment Recommendations  Wheelchair (measurements PT);Wheelchair cushion (measurements PT)    Recommendations for Other Services       Precautions / Restrictions Precautions Precautions: Fall Precaution Comments: B BKA's: R healed,  L BKA rev 8/27, RLE prosthesis Required Braces or Orthoses: Other Brace Other Brace: L limb protector to be worn when up and OOB - pt is non  compliant with use. Restrictions Weight Bearing Restrictions: No RLE Weight Bearing: Weight bearing as tolerated LLE Weight Bearing: Non weight bearing     Mobility  Bed Mobility               General bed mobility comments: up in recliner upon entry    Transfers Overall transfer level: Needs assistance Equipment used: Rolling walker (2 wheeled) Transfers: Lateral/Scoot Transfers;Sit to/from Stand Sit to Stand: Min guard;+2 safety/equipment;From elevated surface        Lateral/Scoot Transfers: Min guard General transfer comment: made multiple attempts at sit to stand from recliner and elevated bed. Unable to get to full standing position even from elevated bed but he also refused to let therapist apply gait belt or physically assist in any way, becoming agitated at all attempts. Able to perform lateral scoots on a min guard basis although very unsafely with posterior lean/unsteadiness and poor sequencing/hand placement.  Ambulation/Gait             General Gait Details: unable/refused   STheme park managerpropulsion: Both upper extremities Wheelchair parts: Supervision/cueing Distance: 100 Wheelchair Assistance Details (indicate cue type and reason): has inefficient pattern of propulsion however able to self-propel about 1086fwith multiple rest breaks. Not open to cues to improve technique or efficiency of WC mobilization.  Modified Rankin (Stroke Patients Only)       Balance Overall balance assessment: Needs assistance Sitting-balance support: Bilateral upper extremity supported  Sitting balance-Leahy Scale: Fair     Standing balance support: Bilateral upper extremity supported Standing balance-Leahy Scale: Poor Standing balance comment: unable to get up to full standing position even with BUE support on RW                            Cognition Arousal/Alertness: Awake/alert Behavior  During Therapy: Agitated;Impulsive Overall Cognitive Status: Impaired/Different from baseline Area of Impairment: Attention;Memory;Following commands;Safety/judgement;Awareness;Problem solving                   Current Attention Level: Sustained Memory: Decreased recall of precautions;Decreased short-term memory Following Commands: Follows one step commands inconsistently;Follows one step commands with increased time Safety/Judgement: Decreased awareness of safety;Decreased awareness of deficits Awareness: Intellectual Problem Solving: Slow processing;Decreased initiation;Difficulty sequencing;Requires verbal cues;Requires tactile cues General Comments: very poor insight into deficits/safety. Needed repeated education on why limb protector was necessary per MD orders for OOB, and had multiple moments of agitation with therapist yelling "don't you grab me!" even when no one was touching him or attempting to touch him. Very difficult to educate or cue- tends to do things "his way" despite that really not being very safe. Unsure if he really understands the sequence of events that has occurred in the hospital.      Exercises      General Comments General comments (skin integrity, edema, etc.): limb protector fell off multiple times during session despite therapist reapplying it      Pertinent Vitals/Pain Pain Assessment: Faces Faces Pain Scale: Hurts a little bit Pain Location: L residual limb Pain Descriptors / Indicators: Guarding;Discomfort Pain Intervention(s): Limited activity within patient's tolerance;Monitored during session    Home Living                      Prior Function            PT Goals (current goals can now be found in the care plan section) Acute Rehab PT Goals Patient Stated Goal: go home PT Goal Formulation: With patient Time For Goal Achievement: 01/02/21 Potential to Achieve Goals: Fair Additional Goals Additional Goal #1: Pt will propel  wheelchair inluding turns and backwards 1000 ft independently, with no notable DOE Progress towards PT goals: Progressing toward goals    Frequency    Min 3X/week      PT Plan Current plan remains appropriate    Co-evaluation              AM-PAC PT "6 Clicks" Mobility   Outcome Measure  Help needed turning from your back to your side while in a flat bed without using bedrails?: A Little Help needed moving from lying on your back to sitting on the side of a flat bed without using bedrails?: A Little Help needed moving to and from a bed to a chair (including a wheelchair)?: A Little Help needed standing up from a chair using your arms (e.g., wheelchair or bedside chair)?: A Lot Help needed to walk in hospital room?: Total Help needed climbing 3-5 steps with a railing? : Total 6 Click Score: 13    End of Session Equipment Utilized During Treatment: Other (comment) (limb protector; refused gait belt or physical assist) Activity Tolerance: Patient tolerated treatment well Patient left: in chair;with call bell/phone within reach;with chair alarm set;with family/visitor present Nurse Communication: Mobility status PT Visit Diagnosis: Other abnormalities of gait and mobility (R26.89);Muscle weakness (generalized) (M62.81);Difficulty in walking, not elsewhere classified (  R26.2) Pain - Right/Left: Left Pain - part of body: Leg     Time: 1128-1220 PT Time Calculation (min) (ACUTE ONLY): 52 min  Charges:  $Therapeutic Activity: 8-22 mins (co-tx with OT) $Wheel Chair Management: 8-22 mins                    [name], DPT, PN2   Supplemental Physical Therapist [place]    Pager [phone] Acute Rehab Office [phone]

## 2020-12-19 NOTE — Progress Notes (Addendum)
Nutrition Follow-up  DOCUMENTATION CODES:   Obesity unspecified  INTERVENTION:   -Continue MVI with minerals daily -Continue Magic cup TID with meals, each supplement provides 290 kcal and 9 grams of protein  -Due to medical stability, RD will sign off. If further nutritional needs are identified, please re-consult RD  NUTRITION DIAGNOSIS:   Increased nutrient needs related to post-op healing as evidenced by estimated needs.  Ongoing  GOAL:   Patient will meet greater than or equal to 90% of their needs  Progressing   MONITOR:   PO intake, Supplement acceptance, Labs, Weight trends, Skin, I & O's  REASON FOR ASSESSMENT:   LOS    ASSESSMENT:   Keith [name] is a 55 y.o. male with PMH significant for DM2, HTN, HLD, CAD, CKD 3B, HIV, right BKA status.  7/22- s/p lt BKA 8/12- s/p BKA revision 8/27- s/p BKA revision 9/7- wound vac d/c  Reviewed I/O's: -2.1 L x 24 hours and -20 L since 12/05/20   Pt unavailable at time of visit.  Pt remains with good appetite. Noted meal completion 100%.   Per TOC notes, pt is medically stable for discharge and awaiting SNF placement. RD will sign off. If further nutritional needs are identified, please re-consult RD.   Medications reviewed and include vitamin C, colace, tivicay, miralax, and senokot.   Labs reviewed: CBGS: 104-126.   Diet Order:   Diet Order             Diet Carb Modified Fluid consistency: Thin; Room service appropriate? Yes  Diet effective now                   EDUCATION NEEDS:   Education needs have been addressed  Skin:  Skin Assessment: Skin Integrity Issues: Skin Integrity Issues:: Incisions Wound Vac: d/c on 12/07/20 Incisions: s/p lt BKA  Last BM:  12/17/20  Height:   Ht Readings from Last 1 Encounters:  10/17/20 '5\' 10"'$  (1.778 m)    Weight:   Wt Readings from Last 1 Encounters:  11/23/20 121.4 kg    Ideal Body Weight:  65.6 kg (adjusted for bilateral BKAs)  BMI:  Body mass  index is 38.4 kg/m.  Estimated Nutritional Needs:   Kcal:  2000-2000  Protein:  100-115 grams  Fluid:  > 2 L    Loistine Chance, RD, LDN, [place] Registered Dietitian II Certified Diabetes Care and Education Specialist Please refer to [facility] for RD and/or RD on-call/weekend/after hours pager

## 2020-12-19 NOTE — Discharge Summary (Addendum)
Physician Discharge Summary  Keith [name]:[id number] DOB: 1965/06/05 DOA: 10/17/2020  [name], NP  Admit date: 10/17/2020 Discharge date: 12/19/2020  Time spent: 40 minutes  Recommendations for Outpatient Follow-up:  Follow outpatient CBC/CMP Follow blood sugars outpatient - diabetes medicines d/c'd at discharge - follow for need for resumption Ensure urology follow up outpatient for trial of void and hematuria Ensure follow up with orthopedics outpatient for his recent L BKA Ensure follow up with infectious disease for HIV and medication changes and repeat VL and CD4 count Coreg being restarted at reduced dose, follow bp outpatient, adjust meds as needed Ensure cardiology follow up for CAD Follow iron deficiency Ensure he's tolerating resumption of DAPT  Follow mood outpatient    Plan was to discharge to SNF - unfortunately he's been declined.  Not safe for discharge home independently at this point, needs 24 hr care.  Will need to continue to work with [facility] team to facilitate safe discharge.    Discharge Diagnoses:  Principal Problem:   AKI (acute kidney injury) ([place]) Active Problems:   Elevated blood pressure reading with diagnosis of hypertension   HIV disease ([place])   Uncontrolled type 2 diabetes mellitus with hyperglycemia (HCC)   Cellulitis   Ulcer of heel due to diabetes mellitus (HCC)   Normochromic normocytic anemia   Cellulitis of left foot   Acute osteomyelitis of left calcaneus (HCC)   Dehiscence of amputation stump (HCC)   Hx of BKA, left ([place])   Discharge Condition: stable  Diet recommendation: heart healthy, carb mod  Filed Weights   11/20/20 0618 11/23/20 0618 11/23/20 2144  Weight: 117.3 kg 121.4 kg 121.4 kg    History of present illness:  55 yo with PMH T2DM, HTN, HLD, CAD, CKD 3B, HIV, s/p R BKA who initially presented on 7/13 with L heel ulceration.  He was seen by [name] and underwent transtibial amputation with application of  prevena wound vac on 7/22.  Hospitalization was complicated by AKI and renal was consulted.  AKI was thought to be 2/2 ATN 2/2 hypotension.  He had tunneled HD cath placed in preparation for HD on 7/28.  Hospitalization c/b gross hematuria on 8/4, he underwent cystoscopy, clot evacuation, and fulguration of prostate on 8/4.  Tunneled HD catheter removed on 8/8.  8/10 was noted to have wound dehiscence and bleeding from his L BKA and he had revision of his BKA on 8/12.  Given his bleeding on DAPT with PCI 03/22/20, case was discussed with cardiology who recommended continuing aspirin and holding brilinta until bleeding stops and H/H stable (8/14, cards note).  He went back for revision of L BKA again on 8/27.  Hospitalization complicated recurrent hematuria on 8/22 and again on 9/5.  He had CBI.  He failed Coltrane Tugwell trial of void and will d/c with foley in place (with plans for urology follow up).  Renal was consulted again on 9/12 with worsening renal function.  Jardiance was d/c'd and his renal function was back to baseline on the day of discharge.  See below for additional details.  Plan to discharge to SNF today.    Hospital Course:  Gross Hematuria S/p cystoscopy, clot evacuation, fulguration of prostate on 11/09/2020 finasteride Outpatient follow up >700 cc on bladder scan 9/8 -> replace foley catheter 9/8 -- will need outpatient follow up with urology   AKI on CKD IIIb  Hyperkalemia Baseline creatinine about 2.5 Fluctuating creatinine, has been fluctuating, now back to ~2.53 Renal US  repeat without hydro Renal Cs appreciate recs (reconsulted for recurrent AKI on 9/12) - d/c'd jardiance, creatinine close to baseline today - ok with discharge and follow outpatient.  R Recommend follow up with nephrology outpatient  Negative w/u for hydro/acute GN Tunneled dialysis catheter has been removed Renal has now signed off - plan for outpatient nephology follow up (see 8/7 renal note)   Pyuria He's not c/o  UTI sx Consider treatment if symptomatic   Left Heel Osteomyelitis  Recurrent Wound Dehiscence and Bleeding 7/22 transtibial amputation Revision on 8/12 and 8/27 Needs 1 week follow up with orthopedics after discharge   Acute blood loss anemia Continue to monitor, transfuse as indicated Iron def, aocd Stable, discharge with iron, follow   Uncontrolled T2 DM A1c 13.14 Aug 2020 (question accuracy of 8/24 A1c of 5.9 with blood loss anemia) Jardiance d/c'd per renal - follow  Follow BG's outpatient off medcs   CAD s/p Stenting 03/2020 Brilinta discontinued based on discussion with cardiology on 8/14 Resuming brilinta at discharge - cleared with urology and orthopedic from their respective standpoints   HLD Vascepa, lipitor   HIV Continue antiretrovirals discharging on new regimen tivicay, doravine, lamivudine - needs repeat VL and CD4 count    Depressed Mood He denies SI.  Sounds like this is situational.  Continue to monitor.  He's on medications prior to admssion, resume at discharge Follow consider psychiatry outaptient if needed  Procedures: Transtibial amputation with application of prevena wound vac 7/22 Tunneled HD cath 7/28 Cystoscopy with clot evacuation and fulguration of prostate on 8/4 HD cath removed 8/8 Revision of L BKA 8/12 Revision of L BKA 8/27  Consultations: Orthopedics IR Nephrology ID urology  Discharge Exam: Vitals:   12/18/20 2218 12/19/20 0734  BP: 113/71 109/81  Pulse: 82 75  Resp: 18 15  Temp: 98 F (36.7 C) 98 F (36.7 C)  SpO2: 98% 95%   No new complaints Discussed d/c plan  General: No acute distress. Cardiovascular: RRR Lungs: unlabored Foley with clear yellow urine Neurological: Alert and oriented 3. Moves all extremities 4. Cranial nerves II through XII grossly intact. Skin: Warm and dry. No rashes or lesions. Extremities: bilateral BKA  Discharge Instructions   Discharge Instructions     Apply dressing    Complete by: As directed    Apply clean dry dressing daily. May cleanse wound with antibacterial soap and water   Call MD for:  difficulty breathing, headache or visual disturbances   Complete by: As directed    Call MD for:  extreme fatigue   Complete by: As directed    Call MD for:  hives   Complete by: As directed    Call MD for:  persistant dizziness or light-headedness   Complete by: As directed    Call MD for:  persistant nausea and vomiting   Complete by: As directed    Call MD for:  redness, tenderness, or signs of infection (pain, swelling, redness, odor or green/yellow discharge around incision site)   Complete by: As directed    Call MD for:  severe uncontrolled pain   Complete by: As directed    Call MD for:  temperature >100.4   Complete by: As directed    Diet - low sodium heart healthy   Complete by: As directed    Discharge instructions   Complete by: As directed    You've had Yosef Krogh long complicated hospitalization.  You had Juliet Vasbinder left transtibial amputation that has undergone several revisions with  [name].  Follow up with [name] for further recommendations.    Your hospitalization was complicated by acute kidney injury, we think this was related to hemodynamic changes (low blood pressure).  We've made some adjustments to your medications as Alanni Vader result.    You had hematuria (blood in your urine).  You've had Imanuel Pruiett procedure with urology and Wilberto Console foley placed for urinary retention.  This should be continued until you follow with urology outpatient.    Your recent urine looks possibly infected, but you are not having symptoms of Severo Beber urine infection.  If you develop symptoms, please let your provider know so they can consider treatment.  Your A1c is much lower than previously.  We've stopped your diabetes medicines.  Follow this outpatient with your PCP.    We're resuming your aspirin and brilinta.  Follow this with your cardiologists outpatient.    You have iron deficiency anemia  related to bleeding.  We'll start you on iron.  Your HIV medications were changed, you'll need follow up with infectious disease after discharge for repeat labs and follow up.   Follow with your PCP regarding your mood, consider Verle Wheeling change in your medications if your mood is down on your lexapro.  Return for new, recurrent, or worsening symptoms.  Please ask your PCP to request records from this hospitalization so they know what was done and what the next steps will be.   Discharge wound care:   Complete by: As directed    May cleanse stump with antibacterial soap and water . Apply clean dry dressing   Increase activity slowly   Complete by: As directed       Allergies as of 12/19/2020       Reactions   Elemental Sulfur Itching   Patient stated he's allergic to "sulfur" AND "sulfa"   Sulfa Antibiotics Itching        Medication List     STOP taking these medications    amLODipine 10 MG tablet Commonly known as: NORVASC   Biktarvy 50-200-25 MG Tabs tablet Generic drug: bictegravir-emtricitabine-tenofovir AF   insulin aspart protamine- aspart (70-30) 100 UNIT/ML injection Commonly known as: NOVOLOG MIX 70/30   lisinopril 5 MG tablet Commonly known as: ZESTRIL   metFORMIN 1000 MG tablet Commonly known as: GLUCOPHAGE       TAKE these medications    acetaminophen 325 MG tablet Commonly known as: TYLENOL Take 2 tablets (650 mg total) by mouth every 4 (four) hours as needed for mild pain, fever or headache.   alfuzosin 10 MG 24 hr tablet Commonly known as: UROXATRAL Take 1 tablet (10 mg total) by mouth daily with breakfast. Start taking on: December 20, 2020   allopurinol 100 MG tablet Commonly known as: ZYLOPRIM Take 1 tablet (100 mg total) by mouth daily. Start taking on: December 20, 2020 What changed:  medication strength how much to take   ALPRAZolam 1 MG tablet Commonly known as: XANAX Take 1 tablet (1 mg total) by mouth daily as needed for  anxiety.   aspirin 81 MG EC tablet Take 1 tablet (81 mg total) by mouth daily.   atorvastatin 80 MG tablet Commonly known as: LIPITOR Take 1 tablet (80 mg total) by mouth daily.   blood glucose meter kit and supplies Dispense based on patient and insurance preference. Use up to four times daily as directed. (FOR ICD-10 E10.9, E11.9).   carvedilol 3.125 MG tablet Commonly known as: Coreg Take 1 tablet (3.125 mg total) by  mouth 2 (two) times daily. What changed:  medication strength how much to take when to take this   Cool Blood Glucose Test Strips test strip Generic drug: glucose blood Use as instructed   dapsone 100 MG tablet TAKE 1 TABLET (100 MG TOTAL) BY MOUTH DAILY. What changed: how much to take   doravirine 100 MG Tabs tablet Commonly known as: PIFELTRO Take 1 tablet (100 mg total) by mouth daily.   Dovato 50-300 MG Tabs Generic drug: Dolutegravir-lamiVUDine Take 1 tablet by mouth daily.   escitalopram 20 MG tablet Commonly known as: LEXAPRO Take 20 mg by mouth daily.   finasteride 5 MG tablet Commonly known as: PROSCAR Take 1 tablet (5 mg total) by mouth daily. Start taking on: December 20, 2020   gabapentin 100 MG capsule Commonly known as: NEURONTIN Take 1 capsule (100 mg total) by mouth 3 (three) times daily.   nitroGLYCERIN 0.4 MG SL tablet Commonly known as: NITROSTAT PLACE 1 TABLET (0.4 MG TOTAL) UNDER THE TONGUE EVERY FIVE MINUTES X 3 DOSES AS NEEDED FOR CHEST PAIN. What changed: how much to take   onetouch ultrasoft lancets Use as instructed   oxybutynin 5 MG tablet Commonly known as: DITROPAN Take 0.5 tablets (2.5 mg total) by mouth 2 (two) times daily. What changed: how much to take   Oxycodone HCl 10 MG Tabs Take 1 tablet (10 mg total) by mouth every 6 (six) hours as needed (Severe pain.). What changed: reasons to take this   pantoprazole 40 MG tablet Commonly known as: PROTONIX Take 1 tablet (40 mg total) by mouth daily. Start  taking on: December 20, 2020   ticagrelor 90 MG Tabs tablet Commonly known as: BRILINTA Take 90 mg by mouth 2 (two) times daily.   traZODone 100 MG tablet Commonly known as: DESYREL Take 100 mg by mouth at bedtime.   Vascepa 1 g capsule Generic drug: icosapent Ethyl TAKE 2 CAPSULES (2 G TOTAL) BY MOUTH TWO TIMES DAILY. What changed:  how much to take how to take this when to take this               Discharge Care Instructions  (From admission, onward)           Start     Ordered   12/19/20 0000  Discharge wound care:       Comments: May cleanse stump with antibacterial soap and water . Apply clean dry dressing   12/19/20 1225           Allergies  Allergen Reactions   Elemental Sulfur Itching    Patient stated he's allergic to "sulfur" AND "sulfa"   Sulfa Antibiotics Itching    Follow-up Information     Suzan Slick, NP Follow up in 1 week(s).   Specialty: Orthopedic Surgery Contact information: [place] [address] [phone]         Gean Quint, MD Follow up.   Specialty: Nephrology Contact information: [place] [address] [phone]         [name], MD Follow up.   Specialties: Cardiology, Radiology Contact information: [address] [place] [address] [phone]         Janith Lima, MD Follow up.   Specialty: Urology Why: call for follow up appointment for trial of void Contact information: [place] [address] [phone]         Carlyle Basques, MD Follow up.   Specialty:  Infectious Diseases Why: Call for follow up appointment for medications Contact information: [address] [place] [address] [phone]                  The results of significant diagnostics from this hospitalization (including imaging, microbiology, ancillary and laboratory) are listed below for reference.    Significant  Diagnostic Studies: US RENAL  Result Date: 12/18/2020 CLINICAL DATA:  Acute renal insufficiency EXAM: RENAL / URINARY TRACT ULTRASOUND COMPLETE COMPARISON:  12/11/2020 FINDINGS: Right Kidney: Renal measurements: 11.7 x 6.0 x 4.8 cm = volume: 175 mL. Renal cortical echogenicity is diffusely mildly increased, in keeping with changes of underlying medical renal disease, unchanged from prior examination. Cortical thickness is preserved. No hydronephrosis. No intrarenal masses or calcifications are seen. Left Kidney: Renal measurements: 11.4 x 5.1 x 5.3 cm = volume: 160 mL. Similarly, renal cortical echogenicity is mildly increased, similar to prior examination, in keeping with underlying medical renal disease. Cortical thickness is preserved. No hydronephrosis. No intrarenal masses or calcifications are seen. Bladder: Foley catheter balloon is seen within Grettell Ransdell decompressed bladder lumen. Other: None. IMPRESSION: Stable mild cortical hyperechogenicity in keeping with underlying medical renal disease. No hydronephrosis. Electronically Signed   By: Helyn Numbers M.D.   On: 12/18/2020 16:07   US RENAL  Result Date: 12/11/2020 CLINICAL DATA:  Gross hematuria, history HIV/aids, coronary artery disease, stage IV chronic kidney disease, type II diabetes mellitus, hypertension EXAM: RENAL / URINARY TRACT ULTRASOUND COMPLETE COMPARISON:  11/01/2020 FINDINGS: Right Kidney: Renal measurements: 11.7 x 5.5 x 5.4 cm = volume: 183 mL. Normal cortical thickness. Slightly increased cortical echogenicity. No mass, hydronephrosis, or shadowing calcification. Left Kidney: Renal measurements: 11.3 x 5.0 x 5.8 cm = volume: 172 mL. Normal cortical thickness. Slightly increased cortical echogenicity. No mass, hydronephrosis, or shadowing calcification. Bladder: Foley catheter decompresses urinary bladder. Bladder wall appears thickened but this may be an artifact related to underdistention. Other: N/Toshiro Hanken IMPRESSION: Medical renal disease  changes of both kidneys. No evidence of renal mass, hydronephrosis, or calculi. Electronically Signed   By: Ulyses Southward M.D.   On: 12/11/2020 16:37    Microbiology: Recent Results (from the past 240 hour(s))  SARS CORONAVIRUS 2 (TAT 6-24 HRS) Nasopharyngeal Nasopharyngeal Swab     Status: None   Collection Time: 12/17/20 12:25 PM   Specimen: Nasopharyngeal Swab  Result Value Ref Range Status   SARS Coronavirus 2 NEGATIVE NEGATIVE Final    Comment: (NOTE) SARS-CoV-2 target nucleic acids are NOT DETECTED.  The SARS-CoV-2 RNA is generally detectable in upper and lower respiratory specimens during the acute phase of infection. Negative results do not preclude SARS-CoV-2 infection, do not rule out co-infections with other pathogens, and should not be used as the sole basis for treatment or other patient management decisions. Negative results must be combined with clinical observations, patient history, and epidemiological information. The expected result is Negative.  Fact Sheet for Patients: HairSlick.no  Fact Sheet for Healthcare Providers: quierodirigir.com  This test is not yet approved or cleared by the [place] FDA and  has been authorized for detection and/or diagnosis of SARS-CoV-2 by FDA under an Emergency Use Authorization (EUA). This EUA will remain  in effect (meaning this test can be used) for the duration of the COVID-19 declaration under Se ction 564(b)(1) of the Act, 21 U.S.C. section 360bbb-3(b)(1), unless the authorization is terminated or revoked sooner.  Performed at [facility], [address] [place], [place] [address]  Labs: Basic Metabolic Panel: Recent Labs  Lab 12/14/20 0222 12/15/20 0112 12/15/20 1929 12/16/20 0100 12/17/20 0308 12/18/20 0046 12/19/20 0720  NA 137  137 137 136 136 135 135 136  K 5.1  5.1 5.4* 5.0 4.9 5.0 5.2* 4.9  CL 106  105 106 104 105 102 103 101   CO2 $Re'24  23 24 24 24 23 24 26  'kOW$ GLUCOSE 113*  112* 110* 105* 104* 104* 102* 105*  BUN 26*  26* 27* 31* 31* 34* 34* 33*  CREATININE 1.92*  1.98* 2.18* 2.50* 2.55* 2.70* 2.87* 2.53*  CALCIUM 9.2  9.2 9.2 9.1 9.1 8.9 8.9 9.1  MG 2.0 2.2  --  2.3 2.2 2.2  --   PHOS 4.7*  4.7* 5.0*  --  5.3* 5.5* 5.1* 4.9*   Liver Function Tests: Recent Labs  Lab 12/14/20 0222 12/15/20 0112 12/16/20 0100 12/17/20 0308 12/19/20 0720  AST 21  --   --  24  --   ALT 30  --   --  23  --   ALKPHOS 116  --   --  110  --   BILITOT 0.5  --   --  0.6  --   PROT 7.0  --   --  7.2  --   ALBUMIN 2.4*  2.4* 2.5* 2.3* 2.4* 2.5*   No results for input(s): LIPASE, AMYLASE in the last 168 hours. No results for input(s): AMMONIA in the last 168 hours. CBC: Recent Labs  Lab 12/13/20 0123 12/14/20 0222 12/15/20 0112 12/16/20 0100 12/17/20 0308 12/18/20 0046 12/18/20 2003 12/19/20 0720  WBC 9.3 7.7 11.7* 10.3 10.3 8.5  --  7.3  NEUTROABS 6.7 5.2 9.1* 7.3 7.0  --   --   --   HGB 7.8* 7.9* 8.1* 7.7* 7.5* 7.2* 7.6* 7.5*  HCT 24.7* 25.3* 25.9* 24.5* 24.5* 23.6* 24.0* 23.8*  MCV 93.2 92.0 93.2 93.5 93.5 94.0  --  93.3  PLT 360 392 385 382 375 396  --  392   Cardiac Enzymes: No results for input(s): CKTOTAL, CKMB, CKMBINDEX, TROPONINI in the last 168 hours. BNP: BNP (last 3 results) No results for input(s): BNP in the last 8760 hours.  ProBNP (last 3 results) No results for input(s): PROBNP in the last 8760 hours.  CBG: Recent Labs  Lab 12/15/20 1154 12/18/20 2056 12/19/20 0640 12/19/20 1220  GLUCAP 147* 126* 104* 112*       Signed:  Fayrene Helper MD.  [facility] 12/19/2020, 12:29 PM

## 2020-12-20 DIAGNOSIS — D62 Acute posthemorrhagic anemia: Secondary | ICD-10-CM | POA: Diagnosis not present

## 2020-12-20 DIAGNOSIS — R310 Gross hematuria: Secondary | ICD-10-CM

## 2020-12-20 DIAGNOSIS — N179 Acute kidney failure, unspecified: Secondary | ICD-10-CM | POA: Diagnosis not present

## 2020-12-20 DIAGNOSIS — E1165 Type 2 diabetes mellitus with hyperglycemia: Secondary | ICD-10-CM

## 2020-12-20 DIAGNOSIS — M86172 Other acute osteomyelitis, left ankle and foot: Secondary | ICD-10-CM | POA: Diagnosis not present

## 2020-12-20 LAB — GLUCOSE, CAPILLARY
Glucose-Capillary: 135 mg/dL — ABNORMAL HIGH (ref 70–99)
Glucose-Capillary: 109 mg/dL — ABNORMAL HIGH (ref 70–99)
Glucose-Capillary: 126 mg/dL — ABNORMAL HIGH (ref 70–99)

## 2020-12-20 NOTE — Progress Notes (Signed)
[name], male   DOB: [birth year], 55 y.o.   MRN: [name] [facility] Progress Note   Assessment/ Plan:   Brief Narrative: 55 year old male with a past medical history of CKD 3B, DM 2, hypertension, CAD, HIV, history of right BKA who presented with a left heel ulceration, underwent transtibial amputation with wound VAC on 7/22.  His hospital course was complicated by AKI thought to be secondary to ATN from hypotension.  He was prepped for hemodialysis therefore tunneled dialysis catheter was placed on 7/28.  He started to have gross hematuria on 8/4 and underwent cystoscopy/clot evacuation/fulguration of prostate on 8/4.  He did not require dialysis and his tunneled dialysis catheter was removed on 8/8 and we had signed off on 8/7.  Thereafter, he was noted to have wound dehiscence and bleeding from his left BKA and therefore it was revised on 8/12.  He went back for another revision on 8/27.  He had recurrent hematuria on 8/22 and again on a 9/5.  He had CBI on 9/7.   Did have an episode of hypotension on 9/10.  Foley was replaced on 9/8 after failing TOV.  Renal ultrasound on 9/5 without any acute abnormalities. We were reconsulted on 9/12 for AKI again.   1. Acute kidney Injury on chronic kidney disease stage IIIb (baseline creatinine about 2.5): initial injury thought to be ATN from hypotension, did not require HD, TDC removed by IR on 11/13/20. Renal function improved/plateaued. His prior work-up was negative for hydronephrosis/acute GN.   Suspecting his AKI is related to ATN/hemodynamic injury from 9/10 (low BP) and hematuria. He is non-oliguric. Stopped jardiance, ultrasound without any acute changers, dirty ua suggestive of UTI--per primary. Cr improved, back down towards baseline of 2.5 on 9/13. Would be okay with discharge to facility from a nephrology perspective 2.  Bladder outlet obstruction/gross hematuria, recurrent: Underwent cystoscopy this admission with  evacuation of blood clots from his bladder and fulguration of prostate (source=prostate).  S/p CBI. Foley replaced on 9/8. Repeat renal u/s without hydronephrosis 3.  Left foot osteomyelitis status post BKA: per ortho and primary 4.  Normocytic anemia: Compounded by underlying chronic kidney disease and recent hematuria. Transfuse prn 5. DM2, uncontrolled: per primary, stopped jardiance as above  Will sign off from a nephrology perspective. Please call with any questions/concerns.  Gean Quint, MD [facility]   Subjective:   Patient offers no complaints today. No complaints. Uop 2.8L. no labs today. Unable to go to snf, dispo being worked on   Objective:   BP 135/90 (BP Location: Left Arm)   Pulse 79   Temp 98.7 F (37.1 C) (Oral)   Resp 17   Ht '5\' 10"'$  (1.778 m)   Wt 121.4 kg   SpO2 97%   BMI 38.40 kg/m   Intake/Output Summary (Last 24 hours) at 12/20/2020 1129 Last data filed at 12/20/2020 0700 Gross per 24 hour  Intake 840 ml  Output 1400 ml  Net -560 ml   Weight change:   Physical Exam: Gen: nad, sitting up in chair CVS: s1s2, rrr Resp: Clear to auscultation anteriorly bilaterally, no rales/rhonchi Abd: Soft, obese, nontender, bowel sounds normal Ext: b/l BKA Neuro: no focal deficits, no tremors/asterixis GU: +foley w/ yellow & slightly cloudy urine HD access: none  Imaging: US RENAL  Result Date: 12/18/2020 CLINICAL DATA:  Acute renal insufficiency EXAM: RENAL / URINARY TRACT ULTRASOUND COMPLETE COMPARISON:  12/11/2020 FINDINGS: Right Kidney: Renal measurements: 11.7 x 6.0 x 4.8  cm = volume: 175 mL. Renal cortical echogenicity is diffusely mildly increased, in keeping with changes of underlying medical renal disease, unchanged from prior examination. Cortical thickness is preserved. No hydronephrosis. No intrarenal masses or calcifications are seen. Left Kidney: Renal measurements: 11.4 x 5.1 x 5.3 cm = volume: 160 mL. Similarly, renal cortical  echogenicity is mildly increased, similar to prior examination, in keeping with underlying medical renal disease. Cortical thickness is preserved. No hydronephrosis. No intrarenal masses or calcifications are seen. Bladder: Foley catheter balloon is seen within a decompressed bladder lumen. Other: None. IMPRESSION: Stable mild cortical hyperechogenicity in keeping with underlying medical renal disease. No hydronephrosis. Electronically Signed   By: Fidela Salisbury M.D.   On: 12/18/2020 16:07    Labs: BMET Recent Labs  Lab 12/14/20 0222 12/15/20 0112 12/15/20 1929 12/16/20 0100 12/17/20 0308 12/18/20 0046 12/19/20 0720  NA 137  137 137 136 136 135 135 136  K 5.1  5.1 5.4* 5.0 4.9 5.0 5.2* 4.9  CL 106  105 106 104 105 102 103 101  CO2 '24  23 24 24 24 23 24 26  '$ GLUCOSE 113*  112* 110* 105* 104* 104* 102* 105*  BUN 26*  26* 27* 31* 31* 34* 34* 33*  CREATININE 1.92*  1.98* 2.18* 2.50* 2.55* 2.70* 2.87* 2.53*  CALCIUM 9.2  9.2 9.2 9.1 9.1 8.9 8.9 9.1  PHOS 4.7*  4.7* 5.0*  --  5.3* 5.5* 5.1* 4.9*   CBC Recent Labs  Lab 12/14/20 0222 12/15/20 0112 12/16/20 0100 12/17/20 0308 12/18/20 0046 12/18/20 2003 12/19/20 0720  WBC 7.7 11.7* 10.3 10.3 8.5  --  7.3  NEUTROABS 5.2 9.1* 7.3 7.0  --   --   --   HGB 7.9* 8.1* 7.7* 7.5* 7.2* 7.6* 7.5*  HCT 25.3* 25.9* 24.5* 24.5* 23.6* 24.0* 23.8*  MCV 92.0 93.2 93.5 93.5 94.0  --  93.3  PLT 392 385 382 375 396  --  392    Medications:     alfuzosin  10 mg Oral Q breakfast   allopurinol  100 mg Oral Daily   vitamin C  1,000 mg Oral Daily   aspirin EC  81 mg Oral Daily   atorvastatin  80 mg Oral Daily   Chlorhexidine Gluconate Cloth  6 each Topical Daily   dapsone  100 mg Oral Daily   docusate sodium  100 mg Oral Daily   dolutegravir  50 mg Oral Daily   doravirine  100 mg Oral Daily   escitalopram  20 mg Oral Daily   finasteride  5 mg Oral Daily   gabapentin  300 mg Oral TID   icosapent Ethyl  2 g Oral BID   lamiVUDine  300 mg  Oral Daily   multivitamin with minerals  1 tablet Oral Q24H   oxybutynin  2.5 mg Oral BID   oxyCODONE  10 mg Oral QID   pantoprazole  40 mg Oral Daily   polyethylene glycol  17 g Oral BID   senna  2 tablet Oral QHS    Gean Quint, MD [facility] 12/20/2020, 11:29 AM

## 2020-12-20 NOTE — Hospital Course (Addendum)
55 year old man PMH HIV, CAD, DM, presented from orthopedic office for left heel ulceration that failed outpt treatment, underwent transtibial amputation with application of wound VAC.  Hospitalization complicated by AKI secondary to ATN, gross hematuria requiring cystoscopy and, clot evacuation and fulguration of prostate.  Wound dehiscence 8/10 required revision of BKA.  Second revision of BKA 8/27.  Recurrent hematuria requiring CBI.  Failed voiding trial and will leave hospital with Foley catheter.  Plan was for SNF, however facility declined, escalated to hospital leadership, [facility] leadership; no SNF possible. Will discharge home w/ [facility] 9/20.

## 2020-12-20 NOTE — Progress Notes (Signed)
PROGRESS NOTE  STANFORD SPLITT [id number] DOB: 22-Sep-1965 DOA: 10/17/2020 [name], NP  Brief History   55 year old man presented 7/12 with heel ulceration, underwent transtibial amputation with application of wound VAC.  Hospitalization complicated by AKI secondary to ATN, gross hematuria requiring cystoscopy and, clot evacuation and fulguration of prostate.  Wound dehiscence 8/10 required revision of BKA.  Second revision of BKA/27.  Recurrent hematuria requiring CBI.  Failed voiding trial and will leave hospital with Foley catheter.  Plan was for SNF, however facility declined, disposition unclear at this point.  A & P  Left heel osteomyelitis.  Status post transtibial amputation, ultimately required revision 8/12 and 8/27 -- Follow-up with orthopedics as an outpatient --Continue PT  Acute kidney injury superimposed on CKD stage IIIb Baseline creatinine about 2.5 --Ultrasound without hydronephrosis. --Nephrology recommended discontinuing Jardiance --Appears stable at this point, follow-up with nephrology as an outpatient  Gross hematuria requiring cystoscopy, clot evacuation, fulguration prostate 8/4 --Resolved.   --Foley catheter to stay in place given failure of voiding trial --Outpatient follow-up with urology  Acute blood loss anemia --Stable  HIV --Continue ART on discharge  Diabetes mellitus type 2 --Stable.  Jardiance discontinued per nephrology. --Continue sliding scale insulin  CAD status post stent December 2021 --Brilinta discontinued based on discussion with cardiology 8/14 by previous physician, however restarted on discharge  Social --SNF facility refused.  Disposition unclear.  We will discuss with family tomorrow.  Disposition Plan:  Discussion:   Status is: Inpatient  Remains inpatient appropriate because:Unsafe d/c plan  Dispo:  Patient From:    Planned Disposition: [place]  Medically stable for discharge:      DVT  prophylaxis: SCD's Start: 12/02/20 1135   Code Status: Full Code Level of care: Med-Surg Family Communication:   Murray Hodgkins, MD  [facility] Direct contact: see [email or web address] (further directions at bottom of note if needed) 7PM-7AM contact night coverage as at bottom of note 12/20/2020, 7:18 PM  LOS: 63 days   Significant Hospital Events      Consults:     Procedures:    Significant Diagnostic Tests:     Micro Data:     Antimicrobials:    Interval History/Subjective  CC: f/u AKI  Feels ok, no complaints  Objective   Vitals:  Vitals:   12/20/20 0730 12/20/20 1430  BP: 135/90 (!) 129/91  Pulse: 79 77  Resp: 17 16  Temp: 98.7 F (37.1 C) 99 F (37.2 C)  SpO2: 97% 100%    Exam: Physical Exam Vitals and nursing note reviewed.  Constitutional:      Appearance: Normal appearance.  Cardiovascular:     Rate and Rhythm: Normal rate and regular rhythm.     Heart sounds: No murmur heard. Pulmonary:     Effort: Pulmonary effort is normal. No respiratory distress.     Breath sounds: Normal breath sounds. No wheezing or rales.  Neurological:     Mental Status: He is alert.  Psychiatric:        Mood and Affect: Mood normal.        Behavior: Behavior normal.    I have personally reviewed the labs and other data, making special note of:   Today's Data  CBG stable   Scheduled Meds:  alfuzosin  10 mg Oral Q breakfast   allopurinol  100 mg Oral Daily   vitamin C  1,000 mg Oral Daily   aspirin EC  81 mg Oral Daily   atorvastatin  80 mg Oral Daily   Chlorhexidine Gluconate Cloth  6 each Topical Daily   dapsone  100 mg Oral Daily   docusate sodium  100 mg Oral Daily   dolutegravir  50 mg Oral Daily   doravirine  100 mg Oral Daily   escitalopram  20 mg Oral Daily   finasteride  5 mg Oral Daily   gabapentin  300 mg Oral TID   icosapent Ethyl  2 g Oral BID   lamiVUDine  300 mg Oral Daily   multivitamin with minerals  1 tablet Oral Q24H   oxybutynin   2.5 mg Oral BID   oxyCODONE  10 mg Oral QID   pantoprazole  40 mg Oral Daily   polyethylene glycol  17 g Oral BID   senna  2 tablet Oral QHS   Continuous Infusions:  sodium chloride 50 mL/hr at 12/13/20 0830   magnesium sulfate bolus IVPB      Principal Problem:   AKI (acute kidney injury) ([place]) Active Problems:   Elevated blood pressure reading with diagnosis of hypertension   HIV disease ([place])   Uncontrolled type 2 diabetes mellitus with hyperglycemia (HCC)   Cellulitis   Ulcer of heel due to diabetes mellitus (HCC)   Normochromic normocytic anemia   Cellulitis of left foot   Acute osteomyelitis of left calcaneus (HCC)   Dehiscence of amputation stump (HCC)   Hx of BKA, left ([place])   LOS: 63 days   How to contact the [facility] Attending or Consulting provider 7A - 7P or covering provider during after hours [place], for this patient?  Check the care team in [facility] and look for a) attending/consulting TRH provider listed and b) the [facility] team listed Log into [email or web address] and use [place]'s universal password to access. If you do not have the password, please contact the hospital operator. Locate the [facility] provider you are looking for under [facility] and page to a number that you can be directly reached. If you still have difficulty reaching the provider, please page the [facility] (Director on Call) for the Hospitalists listed on amion for assistance.

## 2020-12-20 NOTE — Progress Notes (Addendum)
Physical Therapy Treatment Patient Details Name: Keith [name] MRN: [name] DOB: 1966/04/07 Today's Date: 12/20/2020   History of Present Illness 55 yo male admitted 10/17/20 after being sent to ED by his orthopedic provider with concerns for L heel ulcer.  He has failed conservative measure and presents with s/p L BKA on 10/27/20. s/p revision to L BKA on 11/17/20 & again on 12/02/20.  PMH: HIV, CKD 3, DM type 2, HTN, HLD, CAD, R BKA.    PT Comments    Patient received in bed, agreeable to PT session. NT present in room getting vital signs. Patient wants to get up to recliner via lateral scoot. Performed bed mobility with supervision for safety. Scooted to recliner with set up assist and supervision. No real physical assist needed. Patient will continue to benefit from skilled PT while here to improve safety and independence. I feel like attempting to stand or be ambulatory at this time is not an attainable/safe goal if he is unable to go to rehab and will be home at discharge. Focus on lateral scoots, wheelchair mobility in next sessions.      Recommendations for follow up therapy are one component of a multi-disciplinary discharge planning process, led by the attending physician.  Recommendations may be updated based on patient status, additional functional criteria and insurance authorization.  Follow Up Recommendations  Home health PT     Equipment Recommendations  Wheelchair (measurements PT);Wheelchair cushion (measurements PT);3in1 (PT);Hospital bed;Other (comment) (needs bariatric BSC with drop arm)    Recommendations for Other Services       Precautions / Restrictions Precautions Precautions: Fall Precaution Comments: B BKA's: R healed,  L BKA rev 8/27, RLE prosthesis Other Brace: L limb protector to be worn when up and OOB - pt is non compliant with use. Restrictions Weight Bearing Restrictions: Yes RLE Weight Bearing: Non weight bearing LLE Weight Bearing: Non weight  bearing     Mobility  Bed Mobility Overal bed mobility: Modified Independent Bed Mobility: Supine to Sit     Supine to sit: Modified independent (Device/Increase time)     General bed mobility comments: patient able to sit up on side of bed without assist    Transfers Overall transfer level: Needs assistance Equipment used: None Transfers: Lateral/Scoot Transfers          Lateral/Scoot Transfers: Supervision General transfer comment: patient is able to scoot from bed to recliner with supervision for safety. Does not want assist. patient found to be soiled once scooted to recliner but did not want assist to be cleaned or to be cleaned  Ambulation/Gait             General Gait Details: not attempted   Stairs             Wheelchair Mobility    Modified Rankin (Stroke Patients Only)       Balance Overall balance assessment: Needs assistance Sitting-balance support: Bilateral upper extremity supported Sitting balance-Leahy Scale: Fair Sitting balance - Comments: supervision for safety in sitting                                    Cognition Arousal/Alertness: Awake/alert Behavior During Therapy: WFL for tasks assessed/performed Overall Cognitive Status: No family/caregiver present to determine baseline cognitive functioning Area of Impairment: Following commands;Safety/judgement;Awareness;Problem solving                   Current Attention  Level: Sustained Memory: Decreased recall of precautions;Decreased short-term memory Following Commands: Follows one step commands inconsistently Safety/Judgement: Decreased awareness of safety;Decreased awareness of deficits Awareness: Intellectual Problem Solving: Requires tactile cues;Requires verbal cues;Decreased initiation General Comments: pt with decreased insight to safety and deficits, pt declines use of gait belt or hands on assist. Easily agitated, and not open to education or  suggestions.  Self limiting at times.      Exercises      General Comments        Pertinent Vitals/Pain Faces Pain Scale: No hurt    Home Living                      Prior Function            PT Goals (current goals can now be found in the care plan section) Acute Rehab PT Goals Patient Stated Goal: go home PT Goal Formulation: With patient Time For Goal Achievement: 01/02/21 Potential to Achieve Goals: Fair Additional Goals Additional Goal #1: Pt will propel WC 198f independently without rest breaks Progress towards PT goals: Not progressing toward goals - comment (difficult to instruct)    Frequency    Min 3X/week      PT Plan Current plan remains appropriate    Co-evaluation              AM-PAC PT "6 Clicks" Mobility   Outcome Measure  Help needed turning from your back to your side while in a flat bed without using bedrails?: A Little Help needed moving from lying on your back to sitting on the side of a flat bed without using bedrails?: A Little Help needed moving to and from a bed to a chair (including a wheelchair)?: A Little Help needed standing up from a chair using your arms (e.g., wheelchair or bedside chair)?: A Lot Help needed to walk in hospital room?: Total Help needed climbing 3-5 steps with a railing? : Total 6 Click Score: 13    End of Session   Activity Tolerance: Patient tolerated treatment well Patient left: in chair;with call bell/phone within reach;with chair alarm set Nurse Communication: Mobility status PT Visit Diagnosis: Other abnormalities of gait and mobility (R26.89);Muscle weakness (generalized) (M62.81);Difficulty in walking, not elsewhere classified (R26.2)     Time: 1429-1440 PT Time Calculation (min) (ACUTE ONLY): 11 min  Charges:  $Therapeutic Activity: 8-22 mins                     Aviance Cooperwood, PT, GCS 12/20/20,2:55 PM

## 2020-12-21 DIAGNOSIS — E1165 Type 2 diabetes mellitus with hyperglycemia: Secondary | ICD-10-CM | POA: Diagnosis not present

## 2020-12-21 DIAGNOSIS — B20 Human immunodeficiency virus [HIV] disease: Secondary | ICD-10-CM | POA: Diagnosis not present

## 2020-12-21 DIAGNOSIS — Z89512 Acquired absence of left leg below knee: Secondary | ICD-10-CM | POA: Diagnosis not present

## 2020-12-21 DIAGNOSIS — Z89511 Acquired absence of right leg below knee: Secondary | ICD-10-CM | POA: Diagnosis not present

## 2020-12-21 LAB — GLUCOSE, CAPILLARY: Glucose-Capillary: 118 mg/dL — ABNORMAL HIGH (ref 70–99)

## 2020-12-21 NOTE — Progress Notes (Signed)
Occupational Therapy Treatment Patient Details Name: Keith [name] MRN: [name] DOB: 1966-01-16 Today's Date: 12/21/2020   History of present illness 55 yo male admitted 10/17/20 after being sent to ED by his orthopedic provider with concerns for L heel ulcer.  He has failed conservative measure and presents with s/p L BKA on 10/27/20. s/p revision to L BKA on 11/17/20 & again on 12/02/20.  PMH: HIV, CKD 3, DM type 2, HTN, HLD, CAD, R BKA.   OT comments  Patient supine in bed and agreeable to OT with encouragement.  Engaged in bathing from [place] with setup for UB and mod assist for LB after max encouragement to attempt to engage in pericare/buttocks bathing. Pt very hesitant that he "can't do it", but able to complete with mod assist (to ensure hygiene) using heavy lateral leans at EOB (this technique would not be safe on BSC)--noted skin breakdown on scrotum and RN notified.  Patient continues to demonstrate decreased insight to safety and presents with self limiting behaviors.  Continue to recommend focus on lateral transfers and ADLs from bed/EOB level if plans to dc home.  Will follow acutely.      Recommendations for follow up therapy are one component of a multi-disciplinary discharge planning process, led by the attending physician.  Recommendations may be updated based on patient status, additional functional criteria and insurance authorization.    Follow Up Recommendations  SNF;Home health OT;Supervision/Assistance - 24 hour (vs HHOT with 24/7 support if declines SNF)    Equipment Recommendations  Other (comment) (3:1 drop arm commode (bari))    Recommendations for Other Services      Precautions / Restrictions Precautions Precautions: Fall Precaution Comments: B BKA's: R healed,  L BKA rev 8/27, RLE prosthesis Required Braces or Orthoses: Other Brace Other Brace: L limb protector to be worn when up and OOB - pt is non compliant with use. Restrictions LLE Weight Bearing: Non weight  bearing Other Position/Activity Restrictions: has prosthetic for R LE       Mobility Bed Mobility Overal bed mobility: Needs Assistance Bed Mobility: Supine to Sit;Sit to Supine     Supine to sit: Supervision Sit to supine: Supervision   General bed mobility comments: for safety    Transfers Overall transfer level: Needs assistance   Transfers: Lateral/Scoot Transfers          Lateral/Scoot Transfers: Supervision General transfer comment: close supervision from EOB to drop arm recliner.  Attempted education for safety of technique but pt declines and voices "no I got this".    Balance Overall balance assessment: Needs assistance Sitting-balance support: Bilateral upper extremity supported Sitting balance-Leahy Scale: Fair Sitting balance - Comments: supervision for safety in sitting                                   ADL either performed or assessed with clinical judgement   ADL Overall ADL's : Needs assistance/impaired     Grooming: Wash/dry hands;Wash/dry face;Set up;Sitting   Upper Body Bathing: Set up;Sitting   Lower Body Bathing: Moderate assistance;Sitting/lateral leans Lower Body Bathing Details (indicate cue type and reason): bathing at EOB, requires MAX encouragement to attempt washing buttocks.  Pt persistent that he needs help, but after further encouargement able to engage minimally with assistance for thoroughness and max cueing for technique.  Relies on heavy lateral leans at EOB to reach buttocks. Upper Body Dressing : Set up;Sitting   Lower  Body Dressing: Sitting/lateral leans;Total assistance Lower Body Dressing Details (indicate cue type and reason): total assist for L LE limb protector Toilet Transfer: Supervision/safety Toilet Transfer Details (indicate cue type and reason): lateral scoot simulated to drop arm recliner, decreased safety with technique but pt declines education on safe transfer         Functional mobility during  ADLs: Supervision/safety;Cueing for safety;Cueing for sequencing General ADL Comments: pt resistive to education and will only complete transfers in "his way".  MAX encouragement to engage in peri area/ buttocks bathing/hygiene at EOB but continues to require assist .     Vision   Vision Assessment?: No apparent visual deficits   Perception     Praxis      Cognition Arousal/Alertness: Awake/alert Behavior During Therapy: WFL for tasks assessed/performed Overall Cognitive Status: No family/caregiver present to determine baseline cognitive functioning Area of Impairment: Awareness;Problem solving;Safety/judgement                         Safety/Judgement: Decreased awareness of safety;Decreased awareness of deficits Awareness: Emergent Problem Solving: Requires verbal cues;Difficulty sequencing General Comments: pt with decreased insight to safety and deficits, continues to decline hands on assist for transfers.  Declines education to improve safety with transfers.  Very self limiting and requires max encouragement to engage in peri care during bathing/toielting.        Exercises     Shoulder Instructions       General Comments limb protector fell off during transfer, declines to re-don.  Noted skin breakdown on scrotum, RN notified.    Pertinent Vitals/ Pain       Pain Assessment: Faces Faces Pain Scale: No hurt  Home Living                                          Prior Functioning/Environment              Frequency  Min 2X/week        Progress Toward Goals  OT Goals(current goals can now be found in the care plan section)  Progress towards OT goals: Progressing toward goals  Acute Rehab OT Goals Patient Stated Goal: go home OT Goal Formulation: With patient  Plan Discharge plan remains appropriate;Frequency remains appropriate    Co-evaluation                 AM-PAC OT "6 Clicks" Daily Activity     Outcome  Measure   Help from another person eating meals?: None Help from another person taking care of personal grooming?: A Little Help from another person toileting, which includes using toliet, bedpan, or urinal?: A Lot Help from another person bathing (including washing, rinsing, drying)?: A Lot Help from another person to put on and taking off regular upper body clothing?: A Little Help from another person to put on and taking off regular lower body clothing?: A Lot 6 Click Score: 16    End of Session    OT Visit Diagnosis: Other abnormalities of gait and mobility (R26.89);Muscle weakness (generalized) (M62.81);Pain Pain - Right/Left: Left Pain - part of body: Leg   Activity Tolerance Patient tolerated treatment well   Patient Left in chair;with call bell/phone within reach;with chair alarm set;with nursing/sitter in room   Nurse Communication Mobility status        Time: [id number] OT Time Calculation (min): 25  min  Charges: OT General Charges $OT Visit: 1 Visit OT Treatments $Self Care/Home Management : 23-37 mins  [place] Pager [phone] Office [phone]   Delight Stare 12/21/2020, 12:09 PM

## 2020-12-21 NOTE — Plan of Care (Signed)
  Problem: Clinical Measurements: Goal: Ability to maintain clinical measurements within normal limits will improve Outcome: Progressing   Problem: Activity: Goal: Risk for activity intolerance will decrease Outcome: Progressing   Problem: Nutrition: Goal: Adequate nutrition will be maintained Outcome: Progressing   Problem: Elimination: Goal: Will not experience complications related to bowel motility Outcome: Progressing   Problem: Pain Managment: Goal: General experience of comfort will improve Outcome: Progressing   Problem: Safety: Goal: Ability to remain free from injury will improve Outcome: Progressing   Problem: Skin Integrity: Goal: Risk for impaired skin integrity will decrease Outcome: Progressing   

## 2020-12-21 NOTE — Progress Notes (Addendum)
PROGRESS NOTE  Keith [name] [id number] DOB: 01-27-1966 DOA: 10/17/2020 [name], NP  Brief History   55 year old man presented 7/12 with heel ulceration, underwent transtibial amputation with application of wound VAC.  Hospitalization complicated by AKI secondary to ATN, gross hematuria requiring cystoscopy and, clot evacuation and fulguration of prostate.  Wound dehiscence 8/10 required revision of BKA.  Second revision of BKA/27.  Recurrent hematuria requiring CBI.  Failed voiding trial and will leave hospital with Foley catheter.  Plan was for SNF, however facility declined, disposition unclear at this point.  A & P  Left heel osteomyelitis.  Status post transtibial amputation, ultimately required revision 8/12 and 8/27 -- Follow-up with orthopedics as an outpatient --Continue PT  Acute kidney injury superimposed on CKD stage IIIb Baseline creatinine about 2.5 --Ultrasound without hydronephrosis. --Nephrology recommended discontinuing Jardiance --Appears stable at this point, follow-up with nephrology as an outpatient  Gross hematuria requiring cystoscopy, clot evacuation, fulguration prostate 8/4 --Resolved.   --Foley catheter to stay in place given failure of voiding trial --Outpatient follow-up with urology  Acute blood loss anemia --Stable on last check  HIV --Continue ART on discharge  Diabetes mellitus type 2 --CBG stable.  Jardiance discontinued per nephrology. --Continue sliding scale insulin  CAD status post stent December 2021 --Brilinta discontinued based on discussion with cardiology 8/14 by previous physician, however consider restart on discharge  Social --SNF facility refused.  Disposition unclear.     Disposition Plan:  Discussion:   Status is: Inpatient  Remains inpatient appropriate because:Unsafe d/c plan  Dispo:  Patient From:    Planned Disposition: [place]  Medically stable for discharge:      DVT  prophylaxis: SCD's Start: 12/02/20 1135   Code Status: Full Code Level of care: Med-Surg Family Communication: updated mother by telephone  Murray Hodgkins, MD  [facility] Direct contact: see [email or web address] (further directions at bottom of note if needed) 7PM-7AM contact night coverage as at bottom of note 12/21/2020, 11:05 AM  LOS: 64 days   Significant Hospital Events      Consults:     Procedures:    Significant Diagnostic Tests:     Micro Data:     Antimicrobials:    Interval History/Subjective  CC: f/u AKI  No complaints today Ok to call his mother  Objective   Vitals:  Vitals:   12/20/20 2036 12/21/20 0900  BP: 107/65 (!) 136/98  Pulse: 75 71  Resp: 16 17  Temp: 98.3 F (36.8 C) 98.5 F (36.9 C)  SpO2: 97% 98%    Exam: Physical Exam Vitals reviewed.  Constitutional:      General: He is not in acute distress.    Appearance: Normal appearance.  Cardiovascular:     Rate and Rhythm: Normal rate and regular rhythm.     Heart sounds: No murmur heard. Pulmonary:     Effort: Pulmonary effort is normal. No respiratory distress.     Breath sounds: Normal breath sounds. No wheezing or rales.  Musculoskeletal:     Comments: Bilateral BKA  Neurological:     Mental Status: He is alert.  Psychiatric:        Mood and Affect: Mood normal.        Behavior: Behavior normal.    I have personally reviewed the labs and other data, making special note of:   Today's Data  CBG is stable   Scheduled Meds:  alfuzosin  10 mg Oral Q breakfast   allopurinol  100  mg Oral Daily   vitamin C  1,000 mg Oral Daily   aspirin EC  81 mg Oral Daily   atorvastatin  80 mg Oral Daily   Chlorhexidine Gluconate Cloth  6 each Topical Daily   dapsone  100 mg Oral Daily   docusate sodium  100 mg Oral Daily   dolutegravir  50 mg Oral Daily   doravirine  100 mg Oral Daily   escitalopram  20 mg Oral Daily   finasteride  5 mg Oral Daily   gabapentin  300 mg Oral TID    icosapent Ethyl  2 g Oral BID   lamiVUDine  300 mg Oral Daily   multivitamin with minerals  1 tablet Oral Q24H   oxybutynin  2.5 mg Oral BID   oxyCODONE  10 mg Oral QID   pantoprazole  40 mg Oral Daily   polyethylene glycol  17 g Oral BID   senna  2 tablet Oral QHS   Continuous Infusions:  sodium chloride 50 mL/hr at 12/13/20 0830   magnesium sulfate bolus IVPB      Principal Problem:   AKI (acute kidney injury) ([place]) Active Problems:   HIV disease ([place])   Uncontrolled type 2 diabetes mellitus with hyperglycemia (HCC)   Cellulitis   Ulcer of heel due to diabetes mellitus (HCC)   Normochromic normocytic anemia   Cellulitis of left foot   Acute osteomyelitis of left calcaneus (HCC)   Dehiscence of amputation stump (HCC)   Hx of BKA, left (HCC)   Gross hematuria   ABLA (acute blood loss anemia)   LOS: 64 days   How to contact the [facility] Attending or Consulting provider 7A - 7P or covering provider during after hours [place], for this patient?  Check the care team in [facility] and look for a) attending/consulting [facility] provider listed and b) the [facility] team listed Log into [email or web address] and use [place] universal password to access. If you do not have the password, please contact the hospital operator. Locate the [facility] provider you are looking for under [facility] and page to a number that you can be directly reached. If you still have difficulty reaching the provider, please page the [facility] (Director on Call) for the Hospitalists listed on amion for assistance.

## 2020-12-22 DIAGNOSIS — I2510 Atherosclerotic heart disease of native coronary artery without angina pectoris: Secondary | ICD-10-CM

## 2020-12-22 DIAGNOSIS — D689 Coagulation defect, unspecified: Secondary | ICD-10-CM

## 2020-12-22 DIAGNOSIS — N179 Acute kidney failure, unspecified: Secondary | ICD-10-CM | POA: Diagnosis not present

## 2020-12-22 DIAGNOSIS — D62 Acute posthemorrhagic anemia: Secondary | ICD-10-CM | POA: Diagnosis not present

## 2020-12-22 DIAGNOSIS — E669 Obesity, unspecified: Secondary | ICD-10-CM

## 2020-12-22 DIAGNOSIS — M86172 Other acute osteomyelitis, left ankle and foot: Secondary | ICD-10-CM | POA: Diagnosis not present

## 2020-12-22 LAB — GLUCOSE, CAPILLARY: Glucose-Capillary: 184 mg/dL — ABNORMAL HIGH (ref 70–99)

## 2020-12-22 NOTE — Assessment & Plan Note (Signed)
Left heel osteomyelitis.  Status post BKA 7/22 with revision 8/12 and further revision 8/27 --Continue PT --f/u w/ [name] one week post discharge

## 2020-12-22 NOTE — Assessment & Plan Note (Signed)
--  Continue ART on discharge, regimen modified by ID as an inpatient

## 2020-12-22 NOTE — Assessment & Plan Note (Signed)
Gross hematuria secondary to traumatic Foley catheter placement, w/ huge hypervascular prostate, requiring cystoscopy, clot evacuation, fulguration prostate 8/4 --Resolved.   --Foley catheter to stay in place given failure of voiding trial --Outpatient follow-up with urology

## 2020-12-22 NOTE — Progress Notes (Signed)
Physical Therapy Treatment Patient Details Name: Keith [name] MRN: [name] DOB: 04/21/1965 Today's Date: 12/22/2020   History of Present Illness 55 yo male admitted 10/17/20 after being sent to ED by his orthopedic provider with concerns for L heel ulcer.  He has failed conservative measure and presents with s/p L BKA on 10/27/20. s/p revision to L BKA on 11/17/20 & again on 12/02/20.  PMH: HIV, CKD 3, DM type 2, HTN, HLD, CAD, R BKA.    PT Comments    Patient received in bed finishing lunch, willing to participate in PT. Continues to refuse gait belt, limb protector, or being physically touched. Started by attempting to transfer to [facility], however he refused to follow PT cues for safe transfer and stated "It has to be my way", but his way was extremely unsafe and involved leaning back/trying to slide into WC with residual limbs first- if not that, he tried grabbing arm rest on other side of WC placing him in a precarious position and attempted to drag himself into the chair that way. Ignored therapists cues completely and became agitated when I stopped his attempts to get into [facility] due to safety concerns/high fall risk. Eventually able to get into recliner using similar methods as above, continued to ignore my advice and cues for safety/technique. Will continue efforts. Educated that lateral scoots/WC level mobility are the safest for him to use at home, however he refuses this education too stating he is going to stand on his prosthetic and transfer in his home.     Recommendations for follow up therapy are one component of a multi-disciplinary discharge planning process, led by the attending physician.  Recommendations may be updated based on patient status, additional functional criteria and insurance authorization.  Follow Up Recommendations  Home health PT     Equipment Recommendations  Wheelchair (measurements PT);Wheelchair cushion (measurements PT);3in1 (PT);Hospital bed;Other (comment)  (bariatric BSC with drop arm)    Recommendations for Other Services       Precautions / Restrictions Precautions Precautions: Fall Precaution Comments: B BKA's: R healed,  L BKA rev 8/27, RLE prosthesis Required Braces or Orthoses: Other Brace Other Brace: L limb protector to be worn when up and OOB - pt is non compliant with use. Restrictions Weight Bearing Restrictions: Yes RLE Weight Bearing: Non weight bearing LLE Weight Bearing: Non weight bearing Other Position/Activity Restrictions: has prosthetic for R LE     Mobility  Bed Mobility Overal bed mobility: Needs Assistance Bed Mobility: Supine to Sit;Sit to Supine     Supine to sit: Supervision Sit to supine: Supervision   General bed mobility comments: HOB elevated + rails    Transfers Overall transfer level: Needs assistance Equipment used: None Transfers: Lateral/Scoot Transfers          Lateral/Scoot Transfers: Supervision General transfer comment: very close S for EOB to drop arm recliner; kept leaning back at EOB and attempting to go feet first into WC and recliner with high risk of sliding off EOB, did not follow cues and refused physical assist. eventually able to unsafely drag himself over into recliner by reaching for opposite arm rail and pulling his body over. Ignored PT cues for technique and safety.  Ambulation/Gait             General Gait Details: unable   Stairs             Wheelchair Mobility    Modified Rankin (Stroke Patients Only)       Balance  Overall balance assessment: Needs assistance Sitting-balance support: Bilateral upper extremity supported Sitting balance-Leahy Scale: Fair Sitting balance - Comments: supervision for safety in sitting                                    Cognition Arousal/Alertness: Awake/alert Behavior During Therapy: WFL for tasks assessed/performed;Impulsive Overall Cognitive Status: No family/caregiver present to determine  baseline cognitive functioning Area of Impairment: Awareness;Problem solving;Safety/judgement                   Current Attention Level: Sustained Memory: Decreased recall of precautions;Decreased short-term memory Following Commands: Follows one step commands inconsistently;Follows one step commands with increased time Safety/Judgement: Decreased awareness of safety;Decreased awareness of deficits Awareness: Intellectual Problem Solving: Requires verbal cues;Difficulty sequencing General Comments: decreased insight into safety and deficits, states "It has to be my way!" and becomes agitated when PT attempted to provide assist to prevent a fall during this session. Very self limiting and unable to solve problems in dynamic setting. Low frustration tolerance.      Exercises      General Comments General comments (skin integrity, edema, etc.): refused limb protector, non-compliant with wear      Pertinent Vitals/Pain Pain Assessment: Faces Faces Pain Scale: No hurt Pain Intervention(s): Limited activity within patient's tolerance;Monitored during session    Home Living                      Prior Function            PT Goals (current goals can now be found in the care plan section) Acute Rehab PT Goals Patient Stated Goal: go home PT Goal Formulation: With patient Time For Goal Achievement: 01/02/21 Potential to Achieve Goals: Fair Additional Goals Additional Goal #1: Pt will propel WC 135f independently without rest breaks Progress towards PT goals: Not progressing toward goals - comment (difficult to instruct/cognition)    Frequency    Min 3X/week      PT Plan Current plan remains appropriate    Co-evaluation              AM-PAC PT "6 Clicks" Mobility   Outcome Measure  Help needed turning from your back to your side while in a flat bed without using bedrails?: A Little Help needed moving from lying on your back to sitting on the side of  a flat bed without using bedrails?: A Little Help needed moving to and from a bed to a chair (including a wheelchair)?: A Little Help needed standing up from a chair using your arms (e.g., wheelchair or bedside chair)?: A Lot Help needed to walk in hospital room?: Total Help needed climbing 3-5 steps with a railing? : Total 6 Click Score: 13    End of Session Equipment Utilized During Treatment: Other (comment) (refused gait belt, limb protector, physical assist) Activity Tolerance: Patient tolerated treatment well Patient left: in chair;with call bell/phone within reach;with chair alarm set Nurse Communication: Mobility status PT Visit Diagnosis: Other abnormalities of gait and mobility (R26.89);Muscle weakness (generalized) (M62.81);Difficulty in walking, not elsewhere classified (R26.2) Pain - Right/Left: Left Pain - part of body: Leg     Time: 1247-1311 PT Time Calculation (min) (ACUTE ONLY): 24 min  Charges:  $Therapeutic Activity: 23-37 mins                    Kewanna Kasprzak U PT, DPT, PN2  Supplemental Physical Therapist [place] [facility]    Pager [phone] Acute Rehab Office [phone]

## 2020-12-22 NOTE — Plan of Care (Signed)
  Problem: Clinical Measurements: Goal: Ability to avoid or minimize complications of infection will improve Outcome: Adequate for Discharge   Problem: Skin Integrity: Goal: Skin integrity will improve Outcome: Adequate for Discharge   Problem: Clinical Measurements: Goal: Ability to maintain clinical measurements within normal limits will improve Outcome: Adequate for Discharge

## 2020-12-22 NOTE — Plan of Care (Signed)
  Problem: Clinical Measurements: Goal: Ability to maintain clinical measurements within normal limits will improve Outcome: Progressing   Problem: Activity: Goal: Risk for activity intolerance will decrease Outcome: Progressing   Problem: Nutrition: Goal: Adequate nutrition will be maintained Outcome: Progressing   Problem: Elimination: Goal: Will not experience complications related to bowel motility Outcome: Progressing   Problem: Pain Managment: Goal: General experience of comfort will improve Outcome: Progressing   Problem: Safety: Goal: Ability to remain free from injury will improve Outcome: Progressing   Problem: Skin Integrity: Goal: Risk for impaired skin integrity will decrease Outcome: Progressing   

## 2020-12-22 NOTE — Assessment & Plan Note (Signed)
w/ neuropathy and PVD, PMH right BKA --CBG stable.  Jardiance discontinued per nephrology. --Continue sliding scale insulin --Follow blood sugars outpatient - diabetes medicines d/c'd at discharge - follow for need for resumption

## 2020-12-22 NOTE — Assessment & Plan Note (Signed)
--  apparent coagulopathy affecting healing of below-knee amputation stump as well as gross hematuria, now stable

## 2020-12-22 NOTE — Progress Notes (Signed)
PROGRESS NOTE  Keith [name] [id number] DOB: 1965-11-11 DOA: 10/17/2020 [name], NP  Brief History   55 year old man presented 7/12 with heel ulceration, underwent transtibial amputation with application of wound VAC.  Hospitalization complicated by AKI secondary to ATN, gross hematuria requiring cystoscopy and, clot evacuation and fulguration of prostate.  Wound dehiscence 8/10 required revision of BKA.  Second revision of BKA 8/27.  Recurrent hematuria requiring CBI.  Failed voiding trial and will leave hospital with Foley catheter.  Plan was for SNF, however facility declined, disposition unclear at this point.  A & P  Acute osteomyelitis of left calcaneus (HCC) Left heel osteomyelitis.  Status post BKA 7/22 with revision 8/12 and further revision 8/27 --Continue PT --f/u w/ [name] one week post discharge  AKI (acute kidney injury) ([place]) superimposed on CKD stage IIIb Baseline creatinine about 2.5, thought secondary to ATN from hypotension and from hematuria. Did not require HD. --Ultrasound without hydronephrosis. --Nephrology recommended discontinuing Jardiance --Appears stable at this point, follow-up with nephrology as an outpatient  HIV disease ([place]) --Continue ART on discharge, regimen modified by ID as an inpatient  Gross hematuria Gross hematuria secondary to traumatic Foley catheter placement, w/ huge hypervascular prostate, requiring cystoscopy, clot evacuation, fulguration prostate 8/4 --Resolved.   --Foley catheter to stay in place given failure of voiding trial --Outpatient follow-up with urology  Coagulopathy [facility]) --apparent coagulopathy affecting healing of below-knee amputation stump as well as gross hematuria, now stable  ABLA (acute blood loss anemia) secondary to gross hematuria and bleeding from left BKA stump. --Stable on last check  Uncontrolled type 2 diabetes mellitus with hyperglycemia (HCC) w/ neuropathy and PVD, PMH right  BKA --CBG stable.  Jardiance discontinued per nephrology. --Continue sliding scale insulin --Follow blood sugars outpatient - diabetes medicines d/c'd at discharge - follow for need for resumption  CAD (coronary artery disease) CAD status post stent December 2021 --Brilinta discontinued based on discussion with cardiology 8/14 by previous physician, however cardiology recommended restarting when possible to complete 12 months s/p PCI dual antiplatelet therapy.  Obesity --unspecified  Social --SNF facility refused.  Disposition unclear.     Disposition Plan:  Discussion:   Status is: Inpatient  Remains inpatient appropriate because:Unsafe d/c plan  Dispo:  Patient From:    Planned Disposition: [place]  Medically stable for discharge:      DVT prophylaxis: SCD's Start: 12/02/20 1135   Code Status: Full Code Level of care: Med-Surg Family Communication: updated mother by telephone  Murray Hodgkins, MD  [facility] Direct contact: see [email or web address] (further directions at bottom of note if needed) 7PM-7AM contact night coverage as at bottom of note 12/22/2020, 4:25 PM  LOS: 60 days   Significant Hospital Events   7/12 admit for left heel ulcer 7/22 left transtibial amputation 7/27 AKI, nephrology consulted 8/2 ID consulted for recs to change HIV regimen 8/4 urology consult for gross hematuria, cystoscopy w/ clot evacuation and fulguration of prostate, CBI 8/7 nephrology signed off w/ renal fxn back to baseline 8/12 revision left BKA secondary to wound dehiscence  8/22 reconsult urology for recurrent gross hematuria 8/24 recurrent left BKA wound dehiscence, significant bleeding from stump 8/27 revision left BKA 9/5 recurrent gross hematuria, foley placed and started on CBI   Consults:  Orthopedics  Nephrology  ID Urology    Procedures:  7/22 left transtibial amputation 7/28 tunneled dialysis catheter 8/4 cystoscopy w/ clot evacuation and  fulguration of prostate 8/12 revision left BKA secondary to wound  dehiscence  8/27 revision left BKA secondary to wound dehiscence   Significant Diagnostic Tests:     Micro Data:     Antimicrobials:    Interval History/Subjective  CC: f/u AKI  No complaints today Ok to call his mother  Objective   Vitals:  Vitals:   12/21/20 2132 12/22/20 0857  BP: 120/80 134/90  Pulse: 80 68  Resp: 16 18  Temp: 98.1 F (36.7 C) 98.6 F (37 C)  SpO2: 100% 98%    Exam: Physical Exam Vitals reviewed.  Constitutional:      General: He is not in acute distress.    Appearance: Normal appearance.  Cardiovascular:     Rate and Rhythm: Normal rate and regular rhythm.     Heart sounds: No murmur heard. Pulmonary:     Effort: Pulmonary effort is normal. No respiratory distress.     Breath sounds: Normal breath sounds. No wheezing or rales.  Musculoskeletal:     Comments: Bilateral BKA  Neurological:     Mental Status: He is alert.  Psychiatric:        Mood and Affect: Mood normal.        Behavior: Behavior normal.    I have personally reviewed the labs and other data, making special note of:   Today's Data  CBG is stable   Scheduled Meds:  alfuzosin  10 mg Oral Q breakfast   allopurinol  100 mg Oral Daily   vitamin C  1,000 mg Oral Daily   aspirin EC  81 mg Oral Daily   atorvastatin  80 mg Oral Daily   Chlorhexidine Gluconate Cloth  6 each Topical Daily   dapsone  100 mg Oral Daily   docusate sodium  100 mg Oral Daily   dolutegravir  50 mg Oral Daily   doravirine  100 mg Oral Daily   escitalopram  20 mg Oral Daily   finasteride  5 mg Oral Daily   gabapentin  300 mg Oral TID   icosapent Ethyl  2 g Oral BID   lamiVUDine  300 mg Oral Daily   multivitamin with minerals  1 tablet Oral Q24H   oxybutynin  2.5 mg Oral BID   oxyCODONE  10 mg Oral QID   pantoprazole  40 mg Oral Daily   polyethylene glycol  17 g Oral BID   senna  2 tablet Oral QHS   Continuous  Infusions:  sodium chloride 50 mL/hr at 12/13/20 0830   magnesium sulfate bolus IVPB      Principal Problem:   AKI (acute kidney injury) ([place]) Active Problems:   HIV disease ([place])   Uncontrolled type 2 diabetes mellitus with hyperglycemia (HCC)   Cellulitis   Ulcer of heel due to diabetes mellitus (HCC)   Normochromic normocytic anemia   Cellulitis of left foot   Acute osteomyelitis of left calcaneus (HCC)   Dehiscence of amputation stump (HCC)   Hx of BKA, left (HCC)   Gross hematuria   ABLA (acute blood loss anemia)   LOS: 65 days   How to contact the [facility] Attending or Consulting provider 7A - 7P or covering provider during after hours [place], for this patient?  Check the care team in [facility] and look for a) attending/consulting TRH provider listed and b) the [facility] team listed Log into [email or web address] and use [place]'s universal password to access. If you do not have the password, please contact the hospital operator. Locate the [facility] provider you are looking for under [facility] and page to a number that you can be directly reached. If you still have difficulty reaching the provider, please page the [facility] (Director on Call) for the Hospitalists listed on amion for assistance.

## 2020-12-22 NOTE — Assessment & Plan Note (Signed)
superimposed on CKD stage IIIb Baseline creatinine about 2.5, thought secondary to ATN from hypotension and from hematuria. Did not require HD. --Ultrasound without hydronephrosis. --Nephrology recommended discontinuing Jardiance --Appears stable at this point, follow-up with nephrology as an outpatient

## 2020-12-22 NOTE — Consult Note (Signed)
   [facility] Inpatient Consult   12/22/2020  Keith [name] [birth year] [id number]  [place] Organization [ACO] Patient: [name] Medicare  Primary Care Provider:  [name], NP [facility]  Patient was assessed for [place] [facility] for community services. Patient was previously active with [place] Management.  Met with inpatient TOC RN at bedside regarding being restarted with [facility] services. Patient is being recommended for a skilled nursing facility level of care.  If patient disposition is to a skilled nursing facility then his level of care for transition is to be met at that level for transition of care.    Plan:  Continue to follow up.  Spoke with inpatient [facility] RNCM regarding current barriers for disposition.  Of note, [facility] Care Management services does not replace or interfere with any services that are arranged by inpatient [facility] care management team.   For additional questions or referrals please contact:   Natividad Brood, RN BSN [facility] Liaison  [phone] business mobile phone Toll free office [phone]  Fax number: [phone] [email or web address]

## 2020-12-22 NOTE — Assessment & Plan Note (Signed)
unspecified

## 2020-12-22 NOTE — TOC Progression Note (Signed)
Transition of Care [facility]) - Initial/Assessment Note    Patient Details  Name: Keith [name] MRN: [name] Date of Birth: 04/27/1965  Transition of Care [facility] CM/SW Contact:    Milinda Antis, [place] Phone Number: 12/22/2020, 5:40 PM  Clinical Narrative:                 Patient was discussed during morning meeting and it was decided to attempt to complete a SNF search again for the patient and send information to leadership about the difficulty with placing this patient as the patient is a readmission and it is believed that he will return to hospital if d/c home.  CSW was informed of a facility that takes [employer] and has opening.  The patient's referral was faxed to this agency on this day [facility] SNF).  CSW called Raquel Sarna with admissions at the facility and verified that the referral was received and forwarded to the [facility] for review.  (If the facility does present a bed offer, insurance authorization through [name] would have to be started again.)    Expected Discharge Plan: [place] (vs SNF) Barriers to Discharge: No Barriers Identified   Patient Goals and CMS Choice Patient states their goals for this hospitalization and ongoing recovery are:: snf CMS Medicare.gov Compare Post Acute Care list provided to:: Patient Choice offered to / list presented to : Patient  Expected Discharge Plan and Services Expected Discharge Plan: [place] (vs SNF) In-house Referral: Clinical Social Work Discharge Planning Services: CM Consult Post Acute Care Choice: [place] Living arrangements for the past 2 months: [place] Expected Discharge Date: 12/19/20                         HH Arranged: RN, Disease Management, PT, OT, Nurse's Aide, Social Work [employer] Agency: [place] Date [facility] Agency Contacted: 12/14/20 Time [place]: [phone] Representative spoke with at [place]: [place]  Arrangements/Services Living arrangements for the past 2 months: [place] with:: Parents Patient language and need for interpreter reviewed:: Yes        Need for Family Participation in Patient Care: Yes (Comment) Care giver support system in place?: Yes (comment) Current home services: DME (has walker) Criminal Activity/Legal Involvement Pertinent to Current Situation/Hospitalization: No - Comment as needed  Activities of Daily Living Home Assistive Devices/Equipment: Environmental consultant (specify type), Shower chair with back, Grab bars in shower, Hand-held shower hose ADL Screening (condition at time of admission) Patient's cognitive ability adequate to safely complete daily activities?: Yes Is the patient deaf or have difficulty hearing?: No Does the patient have difficulty seeing, even when wearing glasses/contacts?: No Does the patient have difficulty concentrating, remembering, or making decisions?: No Patient able to express need for assistance with ADLs?: Yes Does the patient have difficulty dressing or bathing?: Yes Independently performs ADLs?: No Does the patient have difficulty walking or climbing stairs?: Yes Weakness of Legs: Both Weakness of Arms/Hands: None  Permission Sought/Granted Permission sought to share information with : Facility Art therapist granted to share information with : Yes, Verbal Permission Granted     Permission granted to share info w AGENCY: SNFs        Emotional Assessment Appearance:: Appears stated age Attitude/Demeanor/Rapport: Engaged Affect (typically observed): Accepting, Appropriate   Alcohol / Substance Use: Not Applicable Psych Involvement: No (comment)  Admission diagnosis:  Cellulitis [L03.90] Hyperglycemia [R73.9] Renal  insufficiency [N28.9] Cellulitis of left foot [L03.116] Normochromic normocytic anemia [D64.9] Elevated blood pressure reading with diagnosis of hypertension [I10] Diabetic ulcer of  left heel associated with diabetes mellitus of other type, unspecified ulcer stage ([place]) [id number], L97.429] Patient Active Problem List   Diagnosis Date Noted   Coagulopathy ([place]) 12/22/2020   Gross hematuria 12/20/2020   ABLA (acute blood loss anemia) 12/20/2020   Dehiscence of amputation stump (HCC)    Hx of BKA, left ([place])    Cellulitis of left foot    Acute osteomyelitis of left calcaneus (HCC)    Ulcer of heel due to diabetes mellitus (HCC)    Normochromic normocytic anemia    Cellulitis 10/18/2020   Atypical chest pain 123XX123   Acute metabolic encephalopathy 123XX123   Hyperglycemia due to diabetes mellitus ([place]) 08/29/2020   Hyperglycemia 08/29/2020   Acute cerebrovascular accident (CVA) due to ischemia ([place]) 04/28/2020   Type 2 diabetes mellitus with hyperlipidemia ([place])    PAD (peripheral artery disease) ([place])    Hypoglycemia 04/25/2020   Acute ST elevation myocardial infarction (STEMI) due to occlusion of circumflex coronary artery ([place]) 03/22/2020   Uncontrolled type 2 diabetes mellitus with hyperglycemia ([place]) 01/14/2019   Elevated glucose 01/14/2019   Hx of BKA, right ([place]) 01/14/2019   Pressure injury of skin 11/11/2018   STEMI (ST elevation myocardial infarction) ([place]) 11/10/2018   CKD (chronic kidney disease) stage 4, GFR 15-29 ml/min ([place]) 11/10/2018   Amputation stump infection ([place]) 10/28/2018   Type II diabetes mellitus with renal manifestations ([place]) 08/07/2018   Uncontrolled type 2 diabetes mellitus with hyperosmolar nonketotic hyperglycemia ([place]) 08/07/2018   Non-pressure chronic ulcer of right calf limited to breakdown of skin ([place]) 07/06/2018   Type 2 diabetes mellitus without complication, without long-term current use of insulin ([place]) 06/15/2018   Chronic low back pain without sciatica 06/15/2018   Idiopathic chronic venous hypertension of left lower extremity with ulcer and inflammation (HCC)    Venous stasis ulcer of left calf limited to breakdown  of skin without varicose veins ([place]) 04/23/2018   Acquired absence of right leg below knee ([place]) 06/13/2017   Acute kidney injury superimposed on CKD ([place]) 03/15/2017   Diabetic polyneuropathy associated with type 2 diabetes mellitus ([place]) 01/23/2017   AKI (acute kidney injury) ([place]) 09/28/2016   Nausea vomiting and diarrhea 09/28/2016   Onychomycosis of multiple toenails with type 2 diabetes mellitus ([place]) 08/29/2015   MRSA carrier 04/20/2014   Penile wart 02/22/2014   HIV disease ([place])    Insulin-requiring or dependent type II diabetes mellitus ([place]) 02/04/2014   Dental anomaly 11/20/2012   Arthritis of right knee 02/23/2012   Hyperlipidemia 11/10/2011   Hyponatremia 11/10/2011   Chronic pain 08/07/2011   Meralgia paraesthetica 04/23/2011   ERECTILE DYSFUNCTION 08/22/2008   Elevated blood pressure reading with diagnosis of hypertension 05/19/2008   PCP:  [name], NP Pharmacy[facility]   [id number] [place] [address]Phone: [phone]: [phone] [id number] [address]: [phone]: [phone] [address]: [phone]: [phone]   Social Determinants of Health (SDOH) Interventions    Readmission Risk Interventions Readmission Risk Prevention Plan 10/21/2020 11/13/2018 11/13/2018  Transportation Screening Complete - Complete  PCP or Specialist Appt within 3-5  Days - - -  HRI or [place] Work Consult for [place] Planning/Counseling - - -  Palliative Care Screening - - -  Medication Review Press photographer) Complete - Complete  PCP or Specialist appointment within 3-5 days of discharge Complete Complete -  [place] or Home Care Consult Complete Complete -   SW Recovery Care/Counseling Consult Complete Complete -  Palliative Care Screening Not Applicable Not Applicable -  [place] Complete Not Applicable -  Some encounter information is confidential and restricted. Go to Review Flowsheets activity to see all data.  Some recent data might be hidden

## 2020-12-22 NOTE — Assessment & Plan Note (Addendum)
CAD status post stent December 2021 --Brilinta discontinued based on discussion with cardiology 8/14 by previous physician, however cardiology recommended restarting when possible to complete 12 months s/p PCI dual antiplatelet therapy. Will restart on discharge.

## 2020-12-22 NOTE — Assessment & Plan Note (Signed)
secondary to gross hematuria and bleeding from left BKA stump. --Stable on last check

## 2020-12-22 NOTE — Plan of Care (Signed)
  Problem: Clinical Measurements: Goal: Ability to avoid or minimize complications of infection will improve Outcome: Progressing   Problem: Skin Integrity: Goal: Skin integrity will improve Outcome: Progressing   Problem: Activity: Goal: Risk for activity intolerance will decrease Outcome: Progressing   Problem: Nutrition: Goal: Adequate nutrition will be maintained Outcome: Progressing   Problem: Elimination: Goal: Will not experience complications related to bowel motility Outcome: Progressing   Problem: Pain Managment: Goal: General experience of comfort will improve Outcome: Progressing   Problem: Safety: Goal: Ability to remain free from injury will improve Outcome: Progressing   Problem: Skin Integrity: Goal: Risk for impaired skin integrity will decrease Outcome: Progressing

## 2020-12-23 DIAGNOSIS — M86172 Other acute osteomyelitis, left ankle and foot: Secondary | ICD-10-CM | POA: Diagnosis not present

## 2020-12-23 DIAGNOSIS — B20 Human immunodeficiency virus [HIV] disease: Secondary | ICD-10-CM | POA: Diagnosis not present

## 2020-12-23 NOTE — Progress Notes (Signed)
PROGRESS NOTE  Keith [name] [id number] DOB: 1965/11/11 DOA: 10/17/2020 [name], NP  Brief History   55 year old man PMH HIV, CAD, DM, presented from orthopedic office for left heel ulceration that failed outpt treatment, underwent transtibial amputation with application of wound VAC.  Hospitalization complicated by AKI secondary to ATN, gross hematuria requiring cystoscopy and, clot evacuation and fulguration of prostate.  Wound dehiscence 8/10 required revision of BKA.  Second revision of BKA 8/27.  Recurrent hematuria requiring CBI.  Failed voiding trial and will leave hospital with Foley catheter.  Plan was for SNF, however facility declined, disposition unclear at this point.  A & P  Acute osteomyelitis of left calcaneus (HCC) Left heel osteomyelitis.  Status post BKA 7/22 with revision 8/12 and further revision 8/27 --Continue PT --f/u w/ [name] one week post discharge  AKI (acute kidney injury) ([place]) superimposed on CKD stage IIIb Baseline creatinine about 2.5, thought secondary to ATN from hypotension and from hematuria. Did not require HD. --Ultrasound without hydronephrosis. --Nephrology recommended discontinuing Jardiance --Appears stable at this point, follow-up with nephrology as an outpatient  HIV disease ([place]) --Continue ART on discharge, regimen modified by ID as an inpatient  Gross hematuria Gross hematuria secondary to traumatic Foley catheter placement, w/ huge hypervascular prostate, requiring cystoscopy, clot evacuation, fulguration prostate 8/4 --Resolved.   --Foley catheter to stay in place given failure of voiding trial --Outpatient follow-up with urology  Coagulopathy [facility]) --apparent coagulopathy affecting healing of below-knee amputation stump as well as gross hematuria, now stable  ABLA (acute blood loss anemia) secondary to gross hematuria and bleeding from left BKA stump. --Stable on last check  Uncontrolled type 2 diabetes mellitus  with hyperglycemia (HCC) w/ neuropathy and PVD, PMH right BKA --CBG stable.  Jardiance discontinued per nephrology. --Continue sliding scale insulin --Follow blood sugars outpatient - diabetes medicines d/c'd at discharge - follow for need for resumption  CAD (coronary artery disease) CAD status post stent December 2021 --Brilinta discontinued based on discussion with cardiology 8/14 by previous physician, however cardiology recommended restarting when possible to complete 12 months s/p PCI dual antiplatelet therapy.  Obesity --unspecified  Social --SNF facility refused.  Disposition unclear.     Disposition Plan:  Discussion:   Status is: Inpatient  Remains inpatient appropriate because:Unsafe d/c plan  Dispo:  Patient From:    Planned Disposition: [place]  Medically stable for discharge:      DVT prophylaxis: SCD's Start: 12/02/20 1135   Code Status: Full Code Level of care: Med-Surg Family Communication: updated mother by telephone  Murray Hodgkins, MD  [facility] Direct contact: see [email or web address] (further directions at bottom of note if needed) 7PM-7AM contact night coverage as at bottom of note 12/23/2020, 3:27 PM  LOS: 66 days   Significant Hospital Events   7/12 admit for left heel ulcer 7/22 left transtibial amputation 7/27 AKI, nephrology consulted 8/2 ID consulted for recs to change HIV regimen 8/4 urology consult for gross hematuria, cystoscopy w/ clot evacuation and fulguration of prostate, CBI 8/7 nephrology signed off w/ renal fxn back to baseline 8/12 revision left BKA secondary to wound dehiscence  8/22 reconsult urology for recurrent gross hematuria 8/24 recurrent left BKA wound dehiscence, significant bleeding from stump 8/27 revision left BKA 9/5 recurrent gross hematuria, foley placed and started on CBI   Consults:  Orthopedics  Nephrology  ID Urology    Procedures:  7/22 left transtibial amputation 7/28 tunneled  dialysis catheter 8/4 cystoscopy w/ clot evacuation  and fulguration of prostate 8/12 revision left BKA secondary to wound dehiscence  8/27 revision left BKA secondary to wound dehiscence   Significant Diagnostic Tests:     Micro Data:     Antimicrobials:    Interval History/Subjective  CC: f/u AKI  Feels ok  Objective   Vitals:  Vitals:   12/23/20 0739 12/23/20 1502  BP: (!) 135/93 124/81  Pulse: 77 73  Resp: 17 16  Temp: 98.4 F (36.9 C) 98.4 F (36.9 C)  SpO2: 99% 97%    Exam: Physical Exam Vitals reviewed.  Constitutional:      General: He is not in acute distress. Cardiovascular:     Rate and Rhythm: Normal rate and regular rhythm.  Pulmonary:     Effort: Pulmonary effort is normal. No respiratory distress.     Breath sounds: Normal breath sounds. No wheezing.  Neurological:     Mental Status: He is alert.  Psychiatric:        Mood and Affect: Mood normal.        Behavior: Behavior normal.    I have personally reviewed the labs and other data, making special note of:   Today's Data  CBG stable   Scheduled Meds:  alfuzosin  10 mg Oral Q breakfast   allopurinol  100 mg Oral Daily   vitamin C  1,000 mg Oral Daily   aspirin EC  81 mg Oral Daily   atorvastatin  80 mg Oral Daily   Chlorhexidine Gluconate Cloth  6 each Topical Daily   dapsone  100 mg Oral Daily   docusate sodium  100 mg Oral Daily   dolutegravir  50 mg Oral Daily   doravirine  100 mg Oral Daily   escitalopram  20 mg Oral Daily   finasteride  5 mg Oral Daily   gabapentin  300 mg Oral TID   icosapent Ethyl  2 g Oral BID   lamiVUDine  300 mg Oral Daily   multivitamin with minerals  1 tablet Oral Q24H   oxybutynin  2.5 mg Oral BID   oxyCODONE  10 mg Oral QID   pantoprazole  40 mg Oral Daily   polyethylene glycol  17 g Oral BID   senna  2 tablet Oral QHS   Continuous Infusions:  sodium chloride 50 mL/hr at 12/13/20 0830   magnesium sulfate bolus IVPB      Principal  Problem:   Acute osteomyelitis of left calcaneus (HCC) Active Problems:   AKI (acute kidney injury) ([employer])   Gross hematuria   HIV disease ([place])   Uncontrolled type 2 diabetes mellitus with hyperglycemia (HCC)   Ulcer of heel due to diabetes mellitus (HCC)   Normochromic normocytic anemia   Dehiscence of amputation stump (HCC)   Hx of BKA, left (HCC)   ABLA (acute blood loss anemia)   Coagulopathy (HCC)   CAD (coronary artery disease)   Obesity   LOS: 66 days   How to contact the [facility] Attending or Consulting provider 7A - 7P or covering provider during after hours 7P -7A, for this patient?  Check the care team in [facility] and look for a) attending/consulting TRH provider listed and b) the [facility] team listed Log into [email or web address] and use [place]'s universal password to access. If you do not have the password, please contact the hospital operator. Locate the [facility] provider you are looking for under [facility] and page to a number that you can be directly reached. If you still have difficulty  reaching the provider, please page the [facility] (Director on Call) for the Hospitalists listed on amion for assistance.

## 2020-12-24 DIAGNOSIS — E1165 Type 2 diabetes mellitus with hyperglycemia: Secondary | ICD-10-CM | POA: Diagnosis not present

## 2020-12-24 DIAGNOSIS — B20 Human immunodeficiency virus [HIV] disease: Secondary | ICD-10-CM | POA: Diagnosis not present

## 2020-12-24 NOTE — Progress Notes (Signed)
PROGRESS NOTE  Keith [name] [id number] DOB: 1965/06/05 DOA: 10/17/2020 [name], NP  Brief History   55 year old man PMH HIV, CAD, DM, presented from orthopedic office for left heel ulceration that failed outpt treatment, underwent transtibial amputation with application of wound VAC.  Hospitalization complicated by AKI secondary to ATN, gross hematuria requiring cystoscopy and, clot evacuation and fulguration of prostate.  Wound dehiscence 8/10 required revision of BKA.  Second revision of BKA 8/27.  Recurrent hematuria requiring CBI.  Failed voiding trial and will leave hospital with Foley catheter.  Plan was for SNF, however facility declined, disposition unclear at this point, SNF search expanded. High readmission risk.  A & P  Acute osteomyelitis of left calcaneus (HCC) Left heel osteomyelitis.  Status post BKA 7/22 with revision 8/12 and further revision 8/27 --Continue PT --f/u w/ [name] one week post discharge  AKI (acute kidney injury) ([place]) superimposed on CKD stage IIIb Baseline creatinine about 2.5, thought secondary to ATN from hypotension and from hematuria. Did not require HD. --Ultrasound without hydronephrosis. --Nephrology recommended discontinuing Jardiance --Appears stable at this point, follow-up with nephrology as an outpatient  HIV disease ([place]) --Continue ART on discharge, regimen modified by ID as an inpatient  Gross hematuria Gross hematuria secondary to traumatic Foley catheter placement, w/ huge hypervascular prostate, requiring cystoscopy, clot evacuation, fulguration prostate 8/4 --Resolved.   --Foley catheter to stay in place given failure of voiding trial --Outpatient follow-up with urology  Coagulopathy [facility]) --apparent coagulopathy affecting healing of below-knee amputation stump as well as gross hematuria, now stable  ABLA (acute blood loss anemia) secondary to gross hematuria and bleeding from left BKA stump. --Stable on last  check  Uncontrolled type 2 diabetes mellitus with hyperglycemia (HCC) w/ neuropathy and PVD, PMH right BKA --CBG stable.  Jardiance discontinued per nephrology. --Continue sliding scale insulin --Follow blood sugars outpatient - diabetes medicines d/c'd at discharge - follow for need for resumption  CAD (coronary artery disease) CAD status post stent December 2021 --Brilinta discontinued based on discussion with cardiology 8/14 by previous physician, however cardiology recommended restarting when possible to complete 12 months s/p PCI dual antiplatelet therapy.  Obesity --unspecified  Social --SNF facility refused.  Disposition unclear.     Disposition Plan:  Discussion: awaiting results of expanded SNF search  Status is: Inpatient  Remains inpatient appropriate because:Unsafe d/c plan  Dispo:  Patient From:    Planned Disposition: [place]  Medically stable for discharge:      DVT prophylaxis: SCD's Start: 12/02/20 1135   Code Status: Full Code Level of care: Med-Surg Family Communication: updated mother by telephone  Murray Hodgkins, MD  [facility] Direct contact: see [email or web address] (further directions at bottom of note if needed) 7PM-7AM contact night coverage as at bottom of note 12/24/2020, 3:45 PM  LOS: 76 days   Significant Hospital Events   7/12 admit for left heel ulcer 7/22 left transtibial amputation 7/27 AKI, nephrology consulted 8/2 ID consulted for recs to change HIV regimen 8/4 urology consult for gross hematuria, cystoscopy w/ clot evacuation and fulguration of prostate, CBI 8/7 nephrology signed off w/ renal fxn back to baseline 8/12 revision left BKA secondary to wound dehiscence  8/22 reconsult urology for recurrent gross hematuria 8/24 recurrent left BKA wound dehiscence, significant bleeding from stump 8/27 revision left BKA 9/5 recurrent gross hematuria, foley placed and started on CBI   Consults:  Orthopedics  Nephrology   ID Urology    Procedures:  7/22 left  transtibial amputation 7/28 tunneled dialysis catheter 8/4 cystoscopy w/ clot evacuation and fulguration of prostate 8/12 revision left BKA secondary to wound dehiscence  8/27 revision left BKA secondary to wound dehiscence   Interval History/Subjective  CC: f/u AKI  Feels alright  Objective   Vitals:  Vitals:   12/24/20 0818 12/24/20 1505  BP: 114/70 110/78  Pulse: 80 75  Resp: 16 16  Temp: 98.2 F (36.8 C) 98.1 F (36.7 C)  SpO2: 96% 98%    Exam: Physical Exam Vitals and nursing note reviewed.  Constitutional:      General: He is not in acute distress.    Appearance: He is not ill-appearing.  Neurological:     Mental Status: He is alert.  Psychiatric:        Mood and Affect: Mood normal.        Behavior: Behavior normal.   I have personally reviewed the labs and other data, making special note of:   Today's Data  CBG is stable  Scheduled Meds:  alfuzosin  10 mg Oral Q breakfast   allopurinol  100 mg Oral Daily   vitamin C  1,000 mg Oral Daily   aspirin EC  81 mg Oral Daily   atorvastatin  80 mg Oral Daily   Chlorhexidine Gluconate Cloth  6 each Topical Daily   dapsone  100 mg Oral Daily   docusate sodium  100 mg Oral Daily   dolutegravir  50 mg Oral Daily   doravirine  100 mg Oral Daily   escitalopram  20 mg Oral Daily   finasteride  5 mg Oral Daily   gabapentin  300 mg Oral TID   icosapent Ethyl  2 g Oral BID   lamiVUDine  300 mg Oral Daily   multivitamin with minerals  1 tablet Oral Q24H   oxybutynin  2.5 mg Oral BID   oxyCODONE  10 mg Oral QID   pantoprazole  40 mg Oral Daily   polyethylene glycol  17 g Oral BID   senna  2 tablet Oral QHS   Continuous Infusions:  sodium chloride 50 mL/hr at 12/13/20 0830   magnesium sulfate bolus IVPB      Principal Problem:   Acute osteomyelitis of left calcaneus (HCC) Active Problems:   AKI (acute kidney injury) ([place])   Gross hematuria   HIV disease ([place])    Uncontrolled type 2 diabetes mellitus with hyperglycemia (HCC)   Ulcer of heel due to diabetes mellitus (HCC)   Normochromic normocytic anemia   Dehiscence of amputation stump (HCC)   Hx of BKA, left (HCC)   ABLA (acute blood loss anemia)   Coagulopathy (HCC)   CAD (coronary artery disease)   Obesity   LOS: 67 days   How to contact the [facility] Attending or Consulting provider 7A - 7P or covering provider during after hours 7P -7A, for this patient?  Check the care team in [facility] and look for a) attending/consulting TRH provider listed and b) the [facility] team listed Log into [email or web address] and use [place]'s universal password to access. If you do not have the password, please contact the hospital operator. Locate the [facility] provider you are looking for under [facility] and page to a number that you can be directly reached. If you still have difficulty reaching the provider, please page the [facility] (Director on Call) for the Hospitalists listed on amion for assistance.

## 2020-12-25 DIAGNOSIS — B20 Human immunodeficiency virus [HIV] disease: Secondary | ICD-10-CM | POA: Diagnosis not present

## 2020-12-25 DIAGNOSIS — E1129 Type 2 diabetes mellitus with other diabetic kidney complication: Secondary | ICD-10-CM

## 2020-12-25 LAB — GLUCOSE, CAPILLARY: Glucose-Capillary: 104 mg/dL — ABNORMAL HIGH (ref 70–99)

## 2020-12-25 NOTE — Plan of Care (Signed)
  Problem: Clinical Measurements: Goal: Ability to maintain clinical measurements within normal limits will improve Outcome: Progressing   Problem: Pain Managment: Goal: General experience of comfort will improve Outcome: Progressing   Problem: Safety: Goal: Ability to remain free from injury will improve Outcome: Progressing   Problem: Skin Integrity: Goal: Risk for impaired skin integrity will decrease Outcome: Progressing   

## 2020-12-25 NOTE — Progress Notes (Signed)
PROGRESS NOTE  SHONTE KUMP [id number] DOB: [birth year] DOA: 10/17/2020 [name], NP  Brief History   55 year old man PMH HIV, CAD, DM, presented from orthopedic office for left heel ulceration that failed outpt treatment, underwent transtibial amputation with application of wound VAC.  Hospitalization complicated by AKI secondary to ATN, gross hematuria requiring cystoscopy and, clot evacuation and fulguration of prostate.  Wound dehiscence 8/10 required revision of BKA.  Second revision of BKA 8/27.  Recurrent hematuria requiring CBI.  Failed voiding trial and will leave hospital with Foley catheter.  Plan was for SNF, however facility declined, escalated to hospital leadership, [facility] leadership; no SNF possible. Will discharge home w/ [facility] 9/20.  A & P  Acute osteomyelitis of left calcaneus (HCC) Left heel osteomyelitis.  Status post BKA 7/22 with revision 8/12 and further revision 8/27 --Continue PT --f/u w/ [name] one week post discharge  AKI (acute kidney injury) ([place]) superimposed on CKD stage IIIb Baseline creatinine about 2.5, thought secondary to ATN from hypotension and from hematuria. Did not require HD. --Ultrasound without hydronephrosis. --Nephrology recommended discontinuing Jardiance --Appears stable at this point, follow-up with nephrology as an outpatient  HIV disease ([place]) --Continue ART on discharge, regimen modified by ID as an inpatient  Gross hematuria Gross hematuria secondary to traumatic Foley catheter placement, w/ huge hypervascular prostate, requiring cystoscopy, clot evacuation, fulguration prostate 8/4 --Resolved.   --Foley catheter to stay in place given failure of voiding trial --Outpatient follow-up with urology  Coagulopathy [facility]) --apparent coagulopathy affecting healing of below-knee amputation stump as well as gross hematuria, now stable  ABLA (acute blood loss anemia) secondary to gross hematuria and bleeding from left BKA  stump. --Stable on last check  Uncontrolled type 2 diabetes mellitus with hyperglycemia (HCC) w/ neuropathy and PVD, PMH right BKA --CBG stable.  Jardiance discontinued per nephrology. --Continue sliding scale insulin --Follow blood sugars outpatient - diabetes medicines d/c'd at discharge - follow for need for resumption  CAD (coronary artery disease) CAD status post stent December 2021 --Brilinta discontinued based on discussion with cardiology 8/14 by previous physician, however cardiology recommended restarting when possible to complete 12 months s/p PCI dual antiplatelet therapy. Will restart on discharge.  Obesity --unspecified  Social --SNF facility refused.    Disposition Plan:  Discussion: home with [facility] tomorrow  Status is: Inpatient  Remains inpatient appropriate because:Unsafe d/c plan  Dispo:  Patient From: Home  Planned Disposition: Home  Medically stable for discharge: Yes    DVT prophylaxis: SCD's Start: 12/02/20 1135   Code Status: Full Code Level of care: Med-Surg Family Communication: updated mother by telephone  Murray Hodgkins, MD  [facility] Direct contact: see [email or web address] (further directions at bottom of note if needed) 7PM-7AM contact night coverage as at bottom of note 12/25/2020, 3:18 PM  LOS: 68 days   Significant Hospital Events   7/12 admit for left heel ulcer 7/22 left transtibial amputation 7/27 AKI, nephrology consulted 8/2 ID consulted for recs to change HIV regimen 8/4 urology consult for gross hematuria, cystoscopy w/ clot evacuation and fulguration of prostate, CBI 8/7 nephrology signed off w/ renal fxn back to baseline 8/12 revision left BKA secondary to wound dehiscence  8/22 reconsult urology for recurrent gross hematuria 8/24 recurrent left BKA wound dehiscence, significant bleeding from stump 8/27 revision left BKA 9/5 recurrent gross hematuria, foley placed and started on CBI   Consults:  Orthopedics  Nephrology   ID Urology    Procedures:  7/22 left transtibial amputation  7/28 tunneled dialysis catheter 8/4 cystoscopy w/ clot evacuation and fulguration of prostate 8/12 revision left BKA secondary to wound dehiscence  8/27 revision left BKA secondary to wound dehiscence   Interval History/Subjective  CC: f/u AKI  Feels fine  Objective   Vitals:  Vitals:   12/25/20 0608 12/25/20 0900  BP: 122/86 (!) 145/99  Pulse: 74 72  Resp: 18 17  Temp: 97.8 F (36.6 C) 98.4 F (36.9 C)  SpO2: 100% 100%    Exam: Physical Exam Vitals reviewed.  Constitutional:      General: He is not in acute distress.    Appearance: He is not ill-appearing.  Neurological:     Mental Status: He is alert.  Psychiatric:        Mood and Affect: Mood normal.        Behavior: Behavior normal.   I have personally reviewed the labs and other data, making special note of:   Today's Data  CBG has been stable  Scheduled Meds:  alfuzosin  10 mg Oral Q breakfast   allopurinol  100 mg Oral Daily   vitamin C  1,000 mg Oral Daily   aspirin EC  81 mg Oral Daily   atorvastatin  80 mg Oral Daily   Chlorhexidine Gluconate Cloth  6 each Topical Daily   dapsone  100 mg Oral Daily   docusate sodium  100 mg Oral Daily   dolutegravir  50 mg Oral Daily   doravirine  100 mg Oral Daily   escitalopram  20 mg Oral Daily   finasteride  5 mg Oral Daily   gabapentin  300 mg Oral TID   icosapent Ethyl  2 g Oral BID   lamiVUDine  300 mg Oral Daily   multivitamin with minerals  1 tablet Oral Q24H   oxybutynin  2.5 mg Oral BID   oxyCODONE  10 mg Oral QID   pantoprazole  40 mg Oral Daily   polyethylene glycol  17 g Oral BID   senna  2 tablet Oral QHS   Continuous Infusions:  sodium chloride 50 mL/hr at 12/13/20 0830   magnesium sulfate bolus IVPB      Principal Problem:   Acute osteomyelitis of left calcaneus (HCC) Active Problems:   AKI (acute kidney injury) ([place])   Gross hematuria   HIV disease ([place])    Uncontrolled type 2 diabetes mellitus with hyperglycemia (HCC)   Ulcer of heel due to diabetes mellitus (HCC)   Normochromic normocytic anemia   Dehiscence of amputation stump (HCC)   Hx of BKA, left (HCC)   ABLA (acute blood loss anemia)   Coagulopathy (HCC)   CAD (coronary artery disease)   Obesity   LOS: 68 days   How to contact the [facility] Attending or Consulting provider 7A - 7P or covering provider during after hours 7P -7A, for this patient?  Check the care team in [facility] and look for a) attending/consulting TRH provider listed and b) the [facility] team listed Log into [email or web address] and use [place]'s universal password to access. If you do not have the password, please contact the hospital operator. Locate the [facility] provider you are looking for under [facility] and page to a number that you can be directly reached. If you still have difficulty reaching the provider, please page the [facility] (Director on Call) for the Hospitalists listed on amion for assistance.

## 2020-12-25 NOTE — Progress Notes (Cosign Needed)
    Durable Medical Equipment  (From admission, onward)           Start     Ordered   12/25/20 1239  For home use only DME Hospital bed  Once       Question Answer Comment  Length of Need Lifetime   Patient has (list medical condition): S/P L BKA, Hx of HTN, CVA, DM, CKD   The above medical condition requires: Patient requires the ability to reposition frequently   Head must be elevated greater than: 30 degrees   Bed type Semi-electric   Hoyer Lift Yes   Support Surface: Gel Overlay      12/25/20 1244   Unscheduled  For home use only DME 3 n 1  Once       Comments: BARIATRIC BSC WITH DROP ARM   12/25/20 1247

## 2020-12-25 NOTE — TOC Progression Note (Addendum)
Transition of Care [facility]) - Progression Note    Patient Details  Name: Keith [name] MRN: [name] Date of Birth: 09/22/1965  [facility]) CM/SW Contact  Sharin Mons, RN Phone Number: 12/25/2020, 3:51 PM  Clinical Narrative:    NCM spoke with pt and pt's mom regarding d/c plan, home with home health services. Mom states can receive pt after 2pm on tomorrow. Declined hospital bed. States has already purchased a bed in preparation for pt to come home. States will need BSC and W/C.Marland KitchenMarland Kitchenreferral made with [employer]. Equipment ( W/C and [facility] ) will be delivered to pt's home. [place] made aware of potential d/c plan for tomorrow. PTAR services will be needed for pt's transportation to home.   TOC  team will continue tt monitor ans assist with TOC needs.  Expected Discharge Plan: [place] (vs SNF) Barriers to Discharge: No Barriers Identified  Expected Discharge Plan and Services Expected Discharge Plan: [place] (vs SNF) In-house Referral: Clinical Social Work Discharge Planning Services: CM Consult Post Acute Care Choice: [place] arrangements for the past 2 months: [place] Expected Discharge Date: 12/19/20               DME Arranged: High strength lightweight manual wheelchair with seat cushion DME Agency: [employer] Date DME Agency Contacted: 12/25/20 Time DME Agency Contacted: [phone] Representative spoke with at DME Agency: Freda Munro HH Arranged: RN, Disease Management, PT, OT, Nurse's Aide, Social Work [employer] Agency: [place] Date [place]: 12/14/20 Time [place]: [phone] Representative spoke with at [place]: [place] ([place]) Interventions    Readmission Risk Interventions Readmission Risk Prevention Plan 10/21/2020 11/13/2018 11/13/2018  Transportation Screening Complete - Complete  PCP or Specialist Appt  within 3-5 Days - - -  [place] or [place] for [place] - - -  Medication Review Press photographer) Complete - Complete  PCP or Specialist appointment within 3-5 days of discharge Complete Complete -  [place] or Home Care Consult Complete Complete -  SW Recovery Care/Counseling Consult Complete Complete -  Palliative Care Screening Not Applicable Not Applicable -  [place] Complete Not Applicable -  Some encounter information is confidential and restricted. Go to Review Flowsheets activity to see all data.  Some recent data might be hidden

## 2020-12-26 ENCOUNTER — Other Ambulatory Visit (HOSPITAL_COMMUNITY): Payer: Self-pay

## 2020-12-26 LAB — GLUCOSE, CAPILLARY: Glucose-Capillary: 94 mg/dL (ref 70–99)

## 2020-12-26 MED ORDER — ALLOPURINOL 100 MG PO TABS
100.0000 mg | ORAL_TABLET | Freq: Every day | ORAL | 1 refills | Status: DC
  Filled 2020-12-26: qty 30, 30d supply, fill #0

## 2020-12-26 MED ORDER — OXYBUTYNIN CHLORIDE 5 MG PO TABS
2.5000 mg | ORAL_TABLET | Freq: Two times a day (BID) | ORAL | 1 refills | Status: DC
  Filled 2020-12-26: qty 30, 30d supply, fill #0

## 2020-12-26 MED ORDER — CARVEDILOL 3.125 MG PO TABS
3.1250 mg | ORAL_TABLET | Freq: Two times a day (BID) | ORAL | 1 refills | Status: DC
  Filled 2020-12-26: qty 60, 30d supply, fill #0

## 2020-12-26 MED ORDER — OXYCODONE HCL 10 MG PO TABS
10.0000 mg | ORAL_TABLET | Freq: Four times a day (QID) | ORAL | 0 refills | Status: DC | PRN
  Filled 2020-12-26: qty 10, 3d supply, fill #0

## 2020-12-26 MED ORDER — FINASTERIDE 5 MG PO TABS
5.0000 mg | ORAL_TABLET | Freq: Every day | ORAL | 1 refills | Status: AC
  Filled 2020-12-26: qty 30, 30d supply, fill #0

## 2020-12-26 MED ORDER — PANTOPRAZOLE SODIUM 40 MG PO TBEC
40.0000 mg | DELAYED_RELEASE_TABLET | Freq: Every day | ORAL | 0 refills | Status: DC
  Filled 2020-12-26: qty 30, 30d supply, fill #0

## 2020-12-26 MED ORDER — ALFUZOSIN HCL ER 10 MG PO TB24
10.0000 mg | ORAL_TABLET | Freq: Every day | ORAL | 1 refills | Status: AC
  Filled 2020-12-26: qty 30, 30d supply, fill #0

## 2020-12-26 NOTE — Plan of Care (Signed)
  Problem: Clinical Measurements: Goal: Ability to avoid or minimize complications of infection will improve Outcome: Progressing   Problem: Skin Integrity: Goal: Skin integrity will improve Outcome: Progressing   Problem: Clinical Measurements: Goal: Ability to maintain clinical measurements within normal limits will improve Outcome: Progressing Goal: Will remain free from infection Outcome: Progressing   Problem: Activity: Goal: Risk for activity intolerance will decrease Outcome: Progressing   Problem: Nutrition: Goal: Adequate nutrition will be maintained Outcome: Progressing   Problem: Elimination: Goal: Will not experience complications related to bowel motility Outcome: Progressing Goal: Will not experience complications related to urinary retention Outcome: Progressing   Problem: Pain Managment: Goal: General experience of comfort will improve Outcome: Progressing   Problem: Safety: Goal: Ability to remain free from injury will improve Outcome: Progressing   Problem: Skin Integrity: Goal: Risk for impaired skin integrity will decrease Outcome: Progressing   Report received and care assumed from previous shift. VS obtained, shift assessments completed - see flowsheets. Scheduled pain medications given as ordered, see MAR; patient denies need for further intervention. L stump dressing and stump shrinker intact throughout shift, changed per orders and cleansed with soap and water. Foley patent and draining clear, yellow/amber urine; foley care/peri care provided. Educated patient on need to turn and reposition frequently to prevent skin breakdown - verbalizes understanding but needs encouragement. Currently resting in bed, bed in lowest position. Denies needs. Call bell within reach. Bedalarm in use at all times.

## 2020-12-26 NOTE — Progress Notes (Signed)
[name], male   DOB: Apr 08, 1966, 55 y.o.   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  Examination the wound edges are well approximated but there is still some serosanguineous drainage.  Recommend that we leave the staples and stitches in 1 more week he will follow-up in the office in a week.  Continue with 4 x 4 gauze Ace wrap wash with soap and water daily and apply the stump shrinker on top of the Ace wrap.  Patient will follow-up with [facility] for new prosthesis on his right leg.

## 2020-12-26 NOTE — Plan of Care (Signed)
Problem: Clinical Measurements: Goal: Ability to avoid or minimize complications of infection will improve Outcome: Adequate for Discharge   Problem: Skin Integrity: Goal: Skin integrity will improve Outcome: Adequate for Discharge   Problem: Clinical Measurements: Goal: Ability to maintain clinical measurements within normal limits will improve Outcome: Adequate for Discharge Goal: Will remain free from infection Outcome: Adequate for Discharge   Problem: Activity: Goal: Risk for activity intolerance will decrease Outcome: Adequate for Discharge   Problem: Nutrition: Goal: Adequate nutrition will be maintained Outcome: Adequate for Discharge   Problem: Elimination: Goal: Will not experience complications related to bowel motility Outcome: Adequate for Discharge Goal: Will not experience complications related to urinary retention Outcome: Adequate for Discharge   Problem: Pain Managment: Goal: General experience of comfort will improve Outcome: Adequate for Discharge   Problem: Safety: Goal: Ability to remain free from injury will improve Outcome: Adequate for Discharge   Problem: Skin Integrity: Goal: Risk for impaired skin integrity will decrease Outcome: Adequate for Discharge   Report received and care assumed from previous shift RN. VS obtained, shift assessments completed - see flowsheets. Scheduled pain medications given as ordered with adequate relief per patient - see MAR. Educated patient on need to turn and reposition q2hr to prevent skin breakdown - verbalizes understanding but needs encouragement and reinforcement. Discharge paperwork completed, PTAR requested and on floor to transport patient around 0330 but stated unable to transport wheelchair with patient. Discharge deemed unsafe at that time by this RN as patient stated no friend/family to pick up wheelchair until later in day and would not have assistance at home until afternoon; patient is a moderate fall  risk and needs walker for ambulation after L BKA this admission. Patient currently resting in bed, bed in lowest position. Denies needs. Call bell within reach. Bedalarm in use at all times.

## 2020-12-26 NOTE — Progress Notes (Signed)
Small, dime size open area noted to L stump incision. Area cleansed with soap and water per MD orders. Moist to dry dressing applied, wrapped with Kerlix, and secured with ACE wrap. Dr. Sarajane Jews paged and notified of findings. Oncoming RN aware.

## 2020-12-26 NOTE — TOC Transition Note (Signed)
Transition of Care [facility]) - CM/SW Discharge Note   Patient Details  Name: Keith [name] MRN: [id number] Date of Birth: 1965-12-06  Transition of Care [facility]) CM/SW Contact:  Sharin Mons, RN Phone Number: 12/26/2020, 11:49 AM   Clinical Narrative:    Patient will DC to: home  Anticipated DC date: 12/26/2020 Family notified: yes, mom Transport by: Corey Harold   Per MD patient ready for DC today. RN, patient, patient's family and [facility] notified of DC.   DME: 3in1/BSC and W/C will be delivered per [facility] to home today.  [facility] pharmacy to delivery Rx meds to bedside prior to d/c.  Post hospital f/u noted on AVS.   Ambulance transport requested for patient. Transportation papers on front of chart.  RNCM will sign off for now as intervention is no longer needed. Please consult [place] again if new needs arise.    Final next level of care: [place] Barriers to Discharge: No Barriers Identified   Patient Goals and CMS Choice Patient states their goals for this hospitalization and ongoing recovery are:: snf CMS Medicare.gov Compare Post Acute Care list provided to:: Patient Choice offered to / list presented to : Patient  Discharge Placement              Patient chooses bed at: Other - please specify in the comment section below: ([facility] [place]) Patient to be transferred to facility by: [place] Name of family member notified: Pt Ox4 Patient and family notified of of transfer: 12/19/20  Discharge Plan and Services In-house Referral: Clinical Social Work Discharge Planning Services: CM Consult Post Acute Care Choice: [place]          DME Arranged: High Theme park manager wheelchair with seat cushion DME Agency: [facility] Date DME Agency Contacted: 12/25/20 Time DME Agency Contacted: [place] Representative spoke with at DME Agency: Freda Munro HH Arranged: RN, Disease Management, PT, OT, Nurse's Aide, Social Work [employer] Agency:  [place] Date [place]: 12/14/20 Time [place]: [phone] Representative spoke with at [place]: [place] ([place]) Interventions     Readmission Risk Interventions Readmission Risk Prevention Plan 10/21/2020 11/13/2018 11/13/2018  Transportation Screening Complete - Complete  PCP or Specialist Appt within 3-5 Days - - -  [place] or [place] for [place] - - -  Medication Review Press photographer) Complete - Complete  PCP or Specialist appointment within 3-5 days of discharge Complete Complete -  [place] or Home Care Consult Complete Complete -  SW Recovery Care/Counseling Consult Complete Complete -  Palliative Care Screening Not Applicable Not Applicable -  [place] Complete Not Applicable -  Some encounter information is confidential and restricted. Go to Review Flowsheets activity to see all data.  Some recent data might be hidden

## 2020-12-26 NOTE — Discharge Summary (Signed)
Physician Discharge Summary  Keith [name] WJX:[id number] DOB: [birth year] DOA: 10/17/2020  [name], NP  Admit date: 10/17/2020 Discharge date: 12/26/2020  Recommendations for Outpatient Follow-up:   * Acute osteomyelitis of left calcaneus [facility]) --f/u w/ [name] one week post discharge --wound care per Dr Sharol Given leave the staples and stitches in 1 more week he will follow-up in the office in a week.  Continue with 4 x 4 gauze Ace wrap wash with soap and water daily and apply the stump shrinker on top of the Ace wrap.  Patient will follow-up with [facility] for new prosthesis on his right leg.  Gross hematuria Gross hematuria secondary to traumatic Foley catheter placement, w/ huge hypervascular prostate, requiring cystoscopy, clot evacuation, fulguration prostate 8/4 --Resolved.   --Foley catheter to stay in place given failure of voiding trial --Outpatient follow-up with urology  CAD (coronary artery disease) --Brilinta discontinued based on discussion with cardiology 8/14 by previous physician, however cardiology recommended restarting when possible to complete 12 months s/p PCI dual antiplatelet therapy.  --given significant complications, draining wound, foley in place will defer starting ticagrelor back until follow-up with PCP  Uncontrolled type 2 diabetes mellitus with hyperglycemia (HCC) w/ neuropathy and PVD, PMH right BKA --CBG stable.  Jardiance discontinued per nephrology. Now diet controlled, stable as inpt w/o insulin --Follow blood sugars outpatient - diabetes medicines d/c'd at discharge - follow for need for resumption    Follow-up Information     Suzan Slick, NP Follow up in 1 week(s).   Specialty: Orthopedic Surgery Contact information: [place] [address] [phone]         Gean Quint, MD. Schedule an appointment as soon as possible for a visit in 2 week(s).   Specialty: Nephrology Contact information: [place] [address] [phone]         Janith Lima, MD Follow up.   Specialty: Urology Why: call for follow up appointment for trial of void Contact information: [place] [address] [phone]         Carlyle Basques, MD Follow up.   Specialty: Infectious Diseases Why: Call for follow up appointment for medications Contact information: [place] [address] [phone]         [name], NP. Schedule an appointment as soon as possible for a visit.   Specialty: Adult Health Nurse Practitioner Contact information: [address] [place] [address] [phone]         Jettie Booze, MD. Schedule an appointment as soon as possible for a visit in 2 week(s).   Specialties: Cardiology, Radiology, Interventional Cardiology Contact information: [address] [place] [address] [phone]                  Discharge Diagnoses: Principal diagnosis is #1 Principal Problem:   Acute osteomyelitis of left calcaneus (HCC) Active Problems:   AKI (acute kidney injury) ([place])   Gross hematuria   HIV disease ([place])   Uncontrolled type 2 diabetes mellitus with hyperglycemia (HCC)   Ulcer of heel due to diabetes mellitus (HCC)   Normochromic normocytic anemia   Dehiscence of amputation stump (HCC)   Hx of BKA, left (HCC)   ABLA (acute blood loss anemia)   Coagulopathy (HCC)   CAD (coronary artery disease)   Obesity   Discharge Condition: improved Disposition: home with [facility]  Diet recommendation:  Diet Orders (  From admission, onward)     Start     Ordered   12/26/20 0000  Diet general        12/26/20 1246   12/19/20 0000  Diet - low sodium heart healthy        12/19/20 1225   12/02/20 1135  Diet Carb Modified Fluid consistency: Thin; Room service appropriate? Yes  Diet effective now       Question Answer Comment  Calorie Level Medium 1600-2000   Fluid  consistency: Thin   Room service appropriate? Yes      12/02/20 1134             Filed Weights   11/20/20 0618 11/23/20 0618 11/23/20 2144  Weight: 117.3 kg 121.4 kg 121.4 kg    HPI/Hospital Course:   55 year old man PMH HIV, CAD, DM, presented from orthopedic office for left heel ulceration that failed outpt treatment, underwent transtibial amputation with application of wound VAC.  Hospitalization complicated by AKI secondary to ATN, gross hematuria requiring cystoscopy and, clot evacuation and fulguration of prostate.  Wound dehiscence 8/10 required revision of BKA.  Second revision of BKA 8/27.  Recurrent hematuria requiring CBI.  Failed voiding trial and will leave hospital with Foley catheter.  Plan was for SNF, however facility declined, escalated to hospital leadership, [facility] leadership; no SNF possible. Home with HH.  * Acute osteomyelitis of left calcaneus (HCC) Left heel osteomyelitis.  Status post BKA 7/22 with revision 8/12 and further revision 8/27 --Continue PT --f/u w/ [name] one week post discharge --wound care per Dr Sharol Given leave the staples and stitches in 1 more week he will follow-up in the office in a week.  Continue with 4 x 4 gauze Ace wrap wash with soap and water daily and apply the stump shrinker on top of the Ace wrap.  Patient will follow-up with [facility] for new prosthesis on his right leg.  AKI (acute kidney injury) ([place]) superimposed on CKD stage IIIb Baseline creatinine about 2.5, thought secondary to ATN from hypotension and from hematuria. Did not require HD. --Ultrasound without hydronephrosis. --Nephrology recommended discontinuing Jardiance --Appears stable at this point, follow-up with nephrology as an outpatient  Gross hematuria Gross hematuria secondary to traumatic Foley catheter placement, w/ huge hypervascular prostate, requiring cystoscopy, clot evacuation, fulguration prostate 8/4 --Resolved.   --Foley catheter to stay in place given  failure of voiding trial --Outpatient follow-up with urology  Obesity --unspecified  CAD (coronary artery disease) CAD status post stent December 2021 --Brilinta discontinued based on discussion with cardiology 8/14 by previous physician, however cardiology recommended restarting when possible to complete 12 months s/p PCI dual antiplatelet therapy.  --given significant complications, draining wound, foley in place will defer starting ticagrelor back until follow-up with PCP  Coagulopathy ([place]) --apparent coagulopathy affecting healing of below-knee amputation stump as well as gross hematuria, now stable  ABLA (acute blood loss anemia) secondary to gross hematuria and bleeding from left BKA stump. --Stable on last check  Uncontrolled type 2 diabetes mellitus with hyperglycemia (HCC) w/ neuropathy and PVD, PMH right BKA --CBG stable.  Jardiance discontinued per nephrology. Now diet controlled, stable as inpt w/o insulin --Follow blood sugars outpatient - diabetes medicines d/c'd at discharge - follow for need for resumption  HIV disease ([place]) --Continue ART on discharge, regimen modified by ID as an inpatient   [facility]   7/12 admit for left heel ulcer 7/22 left transtibial amputation 7/27 AKI, nephrology consulted 8/2 ID consulted for recs to  change HIV regimen 8/4 urology consult for gross hematuria, cystoscopy w/ clot evacuation and fulguration of prostate, CBI 8/7 nephrology signed off w/ renal fxn back to baseline 8/12 revision left BKA secondary to wound dehiscence  8/22 reconsult urology for recurrent gross hematuria 8/24 recurrent left BKA wound dehiscence, significant bleeding from stump 8/27 revision left BKA 9/5 recurrent gross hematuria, foley placed and started on CBI   Consults:  Orthopedics  Nephrology  ID Urology    Procedures:  7/22 left transtibial amputation 7/28 tunneled dialysis catheter 8/4 cystoscopy w/ clot evacuation and  fulguration of prostate 8/12 revision left BKA secondary to wound dehiscence  8/27 revision left BKA secondary to wound dehiscence   Today's assessment: S: CC: f/u infection  Feels fine No complaints  RN noted opening in wound Dr Jess Barters evaluation noted  O: Vitals:  Vitals:   12/25/20 2000 12/26/20 0850  BP: 106/75 114/78  Pulse: 76 78  Resp: 16 17  Temp: 98.3 F (36.8 C) 98.8 F (37.1 C)  SpO2: 96% 97%    Constitutional:  Appears calm and comfortable Respiratory:  CTA bilaterally, no w/r/r.  Respiratory effort normal.  Cardiovascular:  RRR, no m/r/g Psychiatric:  Mental status Mood, affect appropriate  CBG stable  Discharge Instructions  Discharge Instructions     Apply dressing   Complete by: As directed    Apply clean dry dressing daily. May cleanse wound with antibacterial soap and water   Call MD for:  difficulty breathing, headache or visual disturbances   Complete by: As directed    Call MD for:  extreme fatigue   Complete by: As directed    Call MD for:  hives   Complete by: As directed    Call MD for:  persistant dizziness or light-headedness   Complete by: As directed    Call MD for:  persistant nausea and vomiting   Complete by: As directed    Call MD for:  redness, tenderness, or signs of infection (pain, swelling, redness, odor or green/yellow discharge around incision site)   Complete by: As directed    Call MD for:  severe uncontrolled pain   Complete by: As directed    Call MD for:  temperature >100.4   Complete by: As directed    Diet - low sodium heart healthy   Complete by: As directed    Diet general   Complete by: As directed    Discharge instructions   Complete by: As directed    You've had a long complicated hospitalization.  You had a left transtibial amputation that has undergone several revisions with [name].  Follow up with [name] for further recommendations.    Your hospitalization was complicated by acute kidney  injury, we think this was related to hemodynamic changes (low blood pressure).  We've made some adjustments to your medications as a result.    You had hematuria (blood in your urine).  You've had a procedure with urology and a foley placed for urinary retention.  This should be continued until you follow with urology outpatient.    Your recent urine looks possibly infected, but you are not having symptoms of a urine infection.  If you develop symptoms, please let your provider know so they can consider treatment.  Your A1c is much lower than previously.  We've stopped your diabetes medicines.  Follow this outpatient with your PCP.    We're resuming your aspirin and brilinta.  Follow this with your cardiologists outpatient.  You have iron deficiency anemia related to bleeding.  We'll start you on iron.  Your HIV medications were changed, you'll need follow up with infectious disease after discharge for repeat labs and follow up.   Follow with your PCP regarding your mood, consider a change in your medications if your mood is down on your lexapro.  Return for new, recurrent, or worsening symptoms.  Please ask your PCP to request records from this hospitalization so they know what was done and what the next steps will be.   Discharge instructions   Complete by: As directed    Call your physician or seek immediate medical attention for bleeding, pain, swelling, wound drainage, confusion, shortness of breath or worsening of condition.   Discharge wound care:   Complete by: As directed    leave the staples and stitches in 1 more week he will follow-up in the office in a week.  Continue with 4 x 4 gauze Ace wrap wash with soap and water daily and apply the stump shrinker on top of the Ace wrap.  Patient will follow-up with [facility] for new prosthesis on his right leg.   Discharge wound care:   Complete by: As directed    leave the staples and stitches in 1 more week he will follow-up in the  office in a week.  Continue with 4 x 4 gauze Ace wrap wash with soap and water daily and apply the stump shrinker on top of the Ace wrap.  Patient will follow-up with [facility] for new prosthesis on his right leg.   Increase activity slowly   Complete by: As directed    Increase activity slowly   Complete by: As directed       Allergies as of 12/26/2020       Reactions   Elemental Sulfur Itching   Patient stated he's allergic to "sulfur" AND "sulfa"   Sulfa Antibiotics Itching        Medication List     STOP taking these medications    amLODipine 10 MG tablet Commonly known as: NORVASC   Biktarvy 50-200-25 MG Tabs tablet Generic drug: bictegravir-emtricitabine-tenofovir AF   insulin aspart protamine- aspart (70-30) 100 UNIT/ML injection Commonly known as: NOVOLOG MIX 70/30   lisinopril 5 MG tablet Commonly known as: ZESTRIL   metFORMIN 1000 MG tablet Commonly known as: GLUCOPHAGE   ticagrelor 90 MG Tabs tablet Commonly known as: BRILINTA       TAKE these medications    acetaminophen 325 MG tablet Commonly known as: TYLENOL Take 2 tablets (650 mg total) by mouth every 4 (four) hours as needed for mild pain, fever or headache.   alfuzosin 10 MG 24 hr tablet Commonly known as: UROXATRAL Take 1 tablet (10 mg total) by mouth daily with breakfast.   allopurinol 100 MG tablet Commonly known as: ZYLOPRIM Take 1 tablet (100 mg total) by mouth daily. What changed:  medication strength how much to take   ALPRAZolam 1 MG tablet Commonly known as: XANAX Take 1 tablet (1 mg total) by mouth daily as needed for anxiety.   aspirin 81 MG EC tablet Take 1 tablet (81 mg total) by mouth daily.   atorvastatin 80 MG tablet Commonly known as: LIPITOR Take 1 tablet (80 mg total) by mouth daily.   blood glucose meter kit and supplies Dispense based on patient and insurance preference. Use up to four times daily as directed. (FOR ICD-10 E10.9, E11.9).   carvedilol 3.125 MG  tablet Commonly known as:  Coreg Take 1 tablet (3.125 mg total) by mouth 2 (two) times daily. What changed:  medication strength how much to take when to take this   Cool Blood Glucose Test Strips test strip Generic drug: glucose blood Use as instructed   dapsone 100 MG tablet TAKE 1 TABLET (100 MG TOTAL) BY MOUTH DAILY. What changed: how much to take   doravirine 100 MG Tabs tablet Commonly known as: PIFELTRO Take 1 tablet (100 mg total) by mouth daily.   Dovato 50-300 MG Tabs Generic drug: Dolutegravir-lamiVUDine Take 1 tablet by mouth daily.   escitalopram 20 MG tablet Commonly known as: LEXAPRO Take 20 mg by mouth daily.   ferrous sulfate 325 (65 FE) MG tablet Take 1 tablet (325 mg total) by mouth daily.   finasteride 5 MG tablet Commonly known as: PROSCAR Take 1 tablet (5 mg total) by mouth daily.   gabapentin 100 MG capsule Commonly known as: NEURONTIN Take 1 capsule (100 mg total) by mouth 3 (three) times daily.   nitroGLYCERIN 0.4 MG SL tablet Commonly known as: NITROSTAT PLACE 1 TABLET (0.4 MG TOTAL) UNDER THE TONGUE EVERY FIVE MINUTES X 3 DOSES AS NEEDED FOR CHEST PAIN. What changed: how much to take   onetouch ultrasoft lancets Use as instructed   oxybutynin 5 MG tablet Commonly known as: DITROPAN Take 0.5 tablets (2.5 mg total) by mouth 2 (two) times daily. What changed: how much to take   Oxycodone HCl 10 MG Tabs Take 1 tablet (10 mg total) by mouth every 6 (six) hours as needed (Severe pain.). What changed: reasons to take this   pantoprazole 40 MG tablet Commonly known as: PROTONIX Take 1 tablet (40 mg total) by mouth daily.   traZODone 100 MG tablet Commonly known as: DESYREL Take 100 mg by mouth at bedtime.   Vascepa 1 g capsule Generic drug: icosapent Ethyl TAKE 2 CAPSULES (2 G TOTAL) BY MOUTH TWO TIMES DAILY. What changed:  how much to take how to take this when to take this               Durable Medical Equipment   (From admission, onward)           Start     Ordered   12/25/20 1548  For home use only DME lightweight manual wheelchair with seat cushion  Once       Comments: Patient suffers from [place] which impairs their ability to perform daily activities like dressing ,bathing in the home.  A walker will not resolve  issue with performing activities of daily living. A wheelchair will allow patient to safely perform daily activities. Patient is not able to propel themselves in the home using a standard weight wheelchair due to general weakness. Patient can self propel in the lightweight wheelchair. Length of need Lifetime. Accessories: elevating leg rests (ELRs), wheel locks, extensions and anti-tippers.   12/25/20 1550   12/25/20 1245  For home use only DME 3 n 1  Once       Comments: BARIATRIC BSC WITH DROP ARM   12/25/20 1247   12/25/20 1239  For home use only DME Hospital bed  Once       Question Answer Comment  Length of Need Lifetime   Patient has (list medical condition): S/P L BKA, Hx of HTN, CVA, DM, CKD   The above medical condition requires: Patient requires the ability to reposition frequently   Head must be elevated greater than: 30 degrees   Bed type  Semi-electric   Hoyer Lift Yes   Support Surface: Gel Overlay      12/25/20 1244              Discharge Care Instructions  (From admission, onward)           Start     Ordered   12/26/20 0000  Discharge wound care:       Comments: leave the staples and stitches in 1 more week he will follow-up in the office in a week.  Continue with 4 x 4 gauze Ace wrap wash with soap and water daily and apply the stump shrinker on top of the Ace wrap.  Patient will follow-up with [facility] for new prosthesis on his right leg.   12/26/20 1246   12/26/20 0000  Discharge wound care:       Comments: leave the staples and stitches in 1 more week he will follow-up in the office in a week.  Continue with 4 x 4 gauze Ace wrap wash with soap and  water daily and apply the stump shrinker on top of the Ace wrap.  Patient will follow-up with [facility] for new prosthesis on his right leg.   12/26/20 1246           Allergies  Allergen Reactions   Elemental Sulfur Itching    Patient stated he's allergic to "sulfur" AND "sulfa"   Sulfa Antibiotics Itching    The results of significant diagnostics from this hospitalization (including imaging, microbiology, ancillary and laboratory) are listed below for reference.    Significant Diagnostic Studies: US RENAL  Result Date: 12/18/2020 CLINICAL DATA:  Acute renal insufficiency EXAM: RENAL / URINARY TRACT ULTRASOUND COMPLETE COMPARISON:  12/11/2020 FINDINGS: Right Kidney: Renal measurements: 11.7 x 6.0 x 4.8 cm = volume: 175 mL. Renal cortical echogenicity is diffusely mildly increased, in keeping with changes of underlying medical renal disease, unchanged from prior examination. Cortical thickness is preserved. No hydronephrosis. No intrarenal masses or calcifications are seen. Left Kidney: Renal measurements: 11.4 x 5.1 x 5.3 cm = volume: 160 mL. Similarly, renal cortical echogenicity is mildly increased, similar to prior examination, in keeping with underlying medical renal disease. Cortical thickness is preserved. No hydronephrosis. No intrarenal masses or calcifications are seen. Bladder: Foley catheter balloon is seen within a decompressed bladder lumen. Other: None. IMPRESSION: Stable mild cortical hyperechogenicity in keeping with underlying medical renal disease. No hydronephrosis. Electronically Signed   By: Fidela Salisbury M.D.   On: 12/18/2020 16:07   US RENAL  Result Date: 12/11/2020 CLINICAL DATA:  Gross hematuria, history HIV/aids, coronary artery disease, stage IV chronic kidney disease, type II diabetes mellitus, hypertension EXAM: RENAL / URINARY TRACT ULTRASOUND COMPLETE COMPARISON:  11/01/2020 FINDINGS: Right Kidney: Renal measurements: 11.7 x 5.5 x 5.4 cm = volume: 183 mL. Normal  cortical thickness. Slightly increased cortical echogenicity. No mass, hydronephrosis, or shadowing calcification. Left Kidney: Renal measurements: 11.3 x 5.0 x 5.8 cm = volume: 172 mL. Normal cortical thickness. Slightly increased cortical echogenicity. No mass, hydronephrosis, or shadowing calcification. Bladder: Foley catheter decompresses urinary bladder. Bladder wall appears thickened but this may be an artifact related to underdistention. Other: N/A IMPRESSION: Medical renal disease changes of both kidneys. No evidence of renal mass, hydronephrosis, or calculi. Electronically Signed   By: Lavonia Dana M.D.   On: 12/11/2020 16:37    Microbiology: Recent Results (from the past 240 hour(s))  SARS CORONAVIRUS 2 (TAT 6-24 HRS) Nasopharyngeal Nasopharyngeal Swab     Status: None  Collection Time: 12/17/20 12:25 PM   Specimen: Nasopharyngeal Swab  Result Value Ref Range Status   SARS Coronavirus 2 NEGATIVE NEGATIVE Final    Comment: (NOTE) SARS-CoV-2 target nucleic acids are NOT DETECTED.  The SARS-CoV-2 RNA is generally detectable in upper and lower respiratory specimens during the acute phase of infection. Negative results do not preclude SARS-CoV-2 infection, do not rule out co-infections with other pathogens, and should not be used as the sole basis for treatment or other patient management decisions. Negative results must be combined with clinical observations, patient history, and epidemiological information. The expected result is Negative.  Fact Sheet for Patients: SugarRoll.be  Fact Sheet for Healthcare Providers: https://www.woods-mathews.com/  This test is not yet approved or cleared by the [place] FDA and  has been authorized for detection and/or diagnosis of SARS-CoV-2 by FDA under an Emergency Use Authorization (EUA). This EUA will remain  in effect (meaning this test can be used) for the duration of the COVID-19 declaration  under Se ction 564(b)(1) of the Act, 21 U.S.C. section 360bbb-3(b)(1), unless the authorization is terminated or revoked sooner.  Performed at [facility], [place] [address] [place], [place] [address]     CBG: Recent Labs  Lab 12/20/20 1623 12/20/20 2035 12/21/20 0628 12/25/20 2131 12/26/20 0701  GLUCAP 184* 126* 118* 104* 94    Principal Problem:   Acute osteomyelitis of left calcaneus (HCC) Active Problems:   AKI (acute kidney injury) ([place])   Gross hematuria   HIV disease ([place])   Uncontrolled type 2 diabetes mellitus with hyperglycemia (HCC)   Ulcer of heel due to diabetes mellitus (HCC)   Normochromic normocytic anemia   Dehiscence of amputation stump (HCC)   Hx of BKA, left (HCC)   ABLA (acute blood loss anemia)   Coagulopathy (HCC)   CAD (coronary artery disease)   Obesity   Time coordinating discharge: 35 minutes  Signed:  Murray Hodgkins, MD  [facility]  12/26/2020, 12:46 PM

## 2020-12-26 NOTE — Progress Notes (Signed)
Physical Therapy Treatment Patient Details Name: Keith [name] MRN: [id number] DOB: 05/26/1965 Today's Date: 12/26/2020   History of Present Illness 55 yo male admitted 10/17/20 after being sent to ED by his orthopedic provider with concerns for L heel ulcer.  He has failed conservative measure and presents with s/p L BKA on 10/27/20. s/p revision to L BKA on 11/17/20 & again on 12/02/20.  PMH: HIV, CKD 3, DM type 2, HTN, HLD, CAD, R BKA.    PT Comments    Patient received in bed, more pleasant and cooperative today but self limiting due to pain. Refused EOB/OOB activities, but agreeable to working on amputee HEP. Performed with RLE due to ongoing L LE pain so he would at least have a feel and understanding of form- able to perform supine exercises well but seemed to have a difficult time comprehending sidelying exercises and was unable to complete sidelying tasks with correct form.Will very much benefit from HHPT follow up on these as well as progression of mobility in his home environment. Left in bed with all needs met, bed alarm active, eager for DC home later today. Thank you for the opportunity to participate in his care!    Recommendations for follow up therapy are one component of a multi-disciplinary discharge planning process, led by the attending physician.  Recommendations may be updated based on patient status, additional functional criteria and insurance authorization.  Follow Up Recommendations  Home health PT     Equipment Recommendations  Wheelchair (measurements PT);Wheelchair cushion (measurements PT);3in1 (PT);Hospital bed;Other (comment) (bariatric BSC with drop arm)    Recommendations for Other Services       Precautions / Restrictions Precautions Precautions: Fall Precaution Comments: B BKA's: R healed,  L BKA rev 8/27, RLE prosthesis Required Braces or Orthoses: Other Brace Other Brace: L limb protector to be worn when up and OOB - pt is non compliant with  use. Restrictions Weight Bearing Restrictions: Yes RLE Weight Bearing: Non weight bearing LLE Weight Bearing: Non weight bearing Other Position/Activity Restrictions: has prosthetic for R LE     Mobility  Bed Mobility               General bed mobility comments: declined, pain    Transfers                 General transfer comment: declined, pain  Ambulation/Gait             General Gait Details: unable   Stairs             Wheelchair Mobility    Modified Rankin (Stroke Patients Only)       Balance                                            Cognition Arousal/Alertness: Awake/alert Behavior During Therapy: WFL for tasks assessed/performed;Impulsive Overall Cognitive Status: No family/caregiver present to determine baseline cognitive functioning Area of Impairment: Awareness;Problem solving;Safety/judgement                   Current Attention Level: Sustained Memory: Decreased recall of precautions;Decreased short-term memory Following Commands: Follows one step commands inconsistently;Follows one step commands with increased time Safety/Judgement: Decreased awareness of safety;Decreased awareness of deficits Awareness: Intellectual Problem Solving: Requires verbal cues;Difficulty sequencing General Comments: able to follow simple cues with significant amount of extended time, self limiting due  to pain today. Polite and more cooperative than last session but had a hard time following written and verbal instructions for HEP- will definitely need intense f/u by following [facility] therapies      Exercises      General Comments General comments (skin integrity, edema, etc.): refused EOB/OOB activities today- pain      Pertinent Vitals/Pain Pain Assessment: Faces Faces Pain Scale: Hurts even more Pain Location: L residual limb- just got dressing change Pain Descriptors / Indicators: Guarding;Discomfort Pain  Intervention(s): Limited activity within patient's tolerance;Monitored during session    Home Living                      Prior Function            PT Goals (current goals can now be found in the care plan section) Acute Rehab PT Goals Patient Stated Goal: go home PT Goal Formulation: With patient Time For Goal Achievement: 01/02/21 Potential to Achieve Goals: Fair Additional Goals Additional Goal #1: Pt will propel WC 112f independently without rest breaks Progress towards PT goals: Not progressing toward goals - comment (difficult to instruct/cognition)    Frequency    Min 3X/week      PT Plan Current plan remains appropriate    Co-evaluation              AM-PAC PT "6 Clicks" Mobility   Outcome Measure  Help needed turning from your back to your side while in a flat bed without using bedrails?: A Little Help needed moving from lying on your back to sitting on the side of a flat bed without using bedrails?: A Little Help needed moving to and from a bed to a chair (including a wheelchair)?: A Little Help needed standing up from a chair using your arms (e.g., wheelchair or bedside chair)?: A Lot Help needed to walk in hospital room?: Total Help needed climbing 3-5 steps with a railing? : Total 6 Click Score: 13    End of Session   Activity Tolerance: Patient limited by pain Patient left: in bed;with call bell/phone within reach;with bed alarm set Nurse Communication: Mobility status PT Visit Diagnosis: Other abnormalities of gait and mobility (R26.89);Muscle weakness (generalized) (M62.81);Difficulty in walking, not elsewhere classified (R26.2) Pain - Right/Left: Left Pain - part of body: Leg     Time: 11610-9604PT Time Calculation (min) (ACUTE ONLY): 20 min  Charges:  $Therapeutic Exercise: 8-22 mins                    KWindell Norfolk DPT, PN2   Supplemental Physical Therapist [place]   Pager [phone] Rehab Office  [phone]

## 2020-12-27 ENCOUNTER — Other Ambulatory Visit (HOSPITAL_COMMUNITY): Payer: Self-pay

## 2020-12-27 DIAGNOSIS — Z23 Encounter for immunization: Secondary | ICD-10-CM | POA: Diagnosis not present

## 2020-12-27 DIAGNOSIS — R531 Weakness: Secondary | ICD-10-CM | POA: Diagnosis not present

## 2020-12-27 DIAGNOSIS — M86172 Other acute osteomyelitis, left ankle and foot: Secondary | ICD-10-CM | POA: Diagnosis not present

## 2020-12-27 DIAGNOSIS — Z7401 Bed confinement status: Secondary | ICD-10-CM | POA: Diagnosis not present

## 2020-12-27 DIAGNOSIS — Y835 Amputation of limb(s) as the cause of abnormal reaction of the patient, or of later complication, without mention of misadventure at the time of the procedure: Secondary | ICD-10-CM | POA: Diagnosis not present

## 2020-12-27 DIAGNOSIS — R29898 Other symptoms and signs involving the musculoskeletal system: Secondary | ICD-10-CM | POA: Diagnosis not present

## 2020-12-27 LAB — GLUCOSE, CAPILLARY: Glucose-Capillary: 92 mg/dL (ref 70–99)

## 2020-12-27 MED ORDER — FERROUS SULFATE 325 (65 FE) MG PO TABS
325.0000 mg | ORAL_TABLET | ORAL | 0 refills | Status: DC
  Filled 2020-12-27: qty 12, 28d supply, fill #0

## 2020-12-27 NOTE — Progress Notes (Signed)
Pt alert and oriented in no apparent distress. States tolerable pain level and understands PTAR taking him home. Patient did call his family member to verify they would be home at time of arrival. Verbally heard family state they would be home. All personal items including all prosthesis and parts put  in personal bags and put on stretcher with patient. IV removed. Foley bag emptied and foley care done before discharge.

## 2020-12-27 NOTE — Progress Notes (Signed)
55 yo with hx HIV, CAD, DM who has had Keith [name] long complicated course.  Initially presented with L heel ulceration/osteomyelitis requiring transtibial amputation.  Hospitalization complicated by AKI, hematuria requiring cystoscopy, clot evacuation, and fulguration of prostate, and multiple revisions of his wound.   See below and prior notes for additional details Patient discharged yesterday, d/c delayed as PTAR not able to take his personal items.  No new complaints, notes chronic LLE pain  Today's Vitals   12/26/20 2051 12/26/20 2145 12/26/20 2240 12/27/20 0700  BP: 112/74   114/69  Pulse: 73   79  Resp: 16   17  Temp: 98.4 F (36.9 C)   98.3 F (36.8 C)  TempSrc: Oral   Oral  SpO2: 99%   98%  Weight:      Height:      PainSc:  8  Asleep    Body mass index is 38.4 kg/m.  NAD RRR Unlabored Warm and dry skin Bilateral BKA  Plan for discharge home today.  Case was escalated to hospital leadership and [facility] leadership for placement, but unable to get SNF placement.  D/c placed 9/20.  See prior d/c notes for more detailed notes and d/c plan and recommendations  Acute Osteomyelitis of L Calcaneus S/p BKA 7/22 with revision on 8/12 and further revision 8/27 Wound care per [name]  leave the staples and stitches in 1 more week he will follow-up in the office in Monnie Gudgel week.  Continue with 4 x 4 gauze Ace wrap wash with soap and water daily and apply the stump shrinker on top of the Ace wrap.  Patient will follow-up with [facility] for new prosthesis on his right leg.  AKI Follow up with outpatient renal - creatinine most recently close to baseline  Gross hematuria  Urinary Retention Follow with urology outpatient Foley in place  CAD s/p stent Dec 2021 Brilinta d/c'd based on cardiology discussion - due to continued serosanguinous drainage, will hold brilinta until follow up with PCP  ABLA Recently hb stable  HIV Medications modified by ID  T2DM No longer on meds, follow  outpatient  Discharge Instructions   Discharge Instructions     Apply dressing   Complete by: As directed    Apply clean dry dressing daily. May cleanse wound with antibacterial soap and water   Call MD for:  difficulty breathing, headache or visual disturbances   Complete by: As directed    Call MD for:  extreme fatigue   Complete by: As directed    Call MD for:  hives   Complete by: As directed    Call MD for:  persistant dizziness or light-headedness   Complete by: As directed    Call MD for:  persistant nausea and vomiting   Complete by: As directed    Call MD for:  redness, tenderness, or signs of infection (pain, swelling, redness, odor or green/yellow discharge around incision site)   Complete by: As directed    Call MD for:  severe uncontrolled pain   Complete by: As directed    Call MD for:  temperature >100.4   Complete by: As directed    Diet - low sodium heart healthy   Complete by: As directed    Diet general   Complete by: As directed    Discharge instructions   Complete by: As directed    Call your physician or seek immediate medical attention for bleeding, pain, swelling, wound drainage, confusion, shortness of breath or worsening of  condition.   Discharge instructions   Complete by: As directed    You've had Lota Leamer long complicated hospitalization.  You had Rice Walsh left transtibial amputation that has undergone several revisions with [name].  Follow up with [name] for further recommendations.    Your hospitalization was complicated by acute kidney injury, we think this was related to hemodynamic changes (low blood pressure).  We've made some adjustments to your medications as Jamyiah Labella result.    You had hematuria (blood in your urine).  You've had Sharona Rovner procedure with urology and Tenessa Marsee foley placed for urinary retention.  This should be continued until you follow with urology outpatient.    Your A1c is much lower than previously.  We've stopped your diabetes medicines.  Follow  this outpatient with your PCP. Keep checking your blood sugars and notify your doctor if over 300.  Follow this with your cardiologist, surgeon and PCP and ask them before starting Brilinta.  You have iron deficiency anemia related to bleeding.  We'll start you on iron.  Do not take iron at the same time as your HIV meds.  Your HIV medications were changed, you'll need follow up with infectious disease after discharge for repeat labs and follow up.   Follow with your PCP regarding your mood, consider Laniqua Torrens change in your medications if your mood is down on your lexapro.  Return for new, recurrent, or worsening symptoms.  Please ask your PCP to request records from this hospitalization so they know what was done and what the next steps will be.   Discharge wound care:   Complete by: As directed    leave the staples and stitches in 1 more week he will follow-up in the office in Melva Faux week.  Continue with 4 x 4 gauze Ace wrap wash with soap and water daily and apply the stump shrinker on top of the Ace wrap.  Patient will follow-up with [facility] for new prosthesis on his right leg.   Discharge wound care:   Complete by: As directed    leave the staples and stitches in 1 more week he will follow-up in the office in Steve Gregg week.  Continue with 4 x 4 gauze Ace wrap wash with soap and water daily and apply the stump shrinker on top of the Ace wrap.  Patient will follow-up with [facility] for new prosthesis on his right leg.   Increase activity slowly   Complete by: As directed    Increase activity slowly   Complete by: As directed       Allergies as of 12/27/2020       Reactions   Elemental Sulfur Itching   Patient stated he's allergic to "sulfur" AND "sulfa"   Sulfa Antibiotics Itching        Medication List     STOP taking these medications    amLODipine 10 MG tablet Commonly known as: NORVASC   Biktarvy 50-200-25 MG Tabs tablet Generic drug: bictegravir-emtricitabine-tenofovir AF   insulin  aspart protamine- aspart (70-30) 100 UNIT/ML injection Commonly known as: NOVOLOG MIX 70/30   lisinopril 5 MG tablet Commonly known as: ZESTRIL   metFORMIN 1000 MG tablet Commonly known as: GLUCOPHAGE   ticagrelor 90 MG Tabs tablet Commonly known as: BRILINTA       TAKE these medications    acetaminophen 325 MG tablet Commonly known as: TYLENOL Take 2 tablets (650 mg total) by mouth every 4 (four) hours as needed for mild pain, fever or headache.   alfuzosin 10  MG 24 hr tablet Commonly known as: UROXATRAL Take 1 tablet (10 mg total) by mouth daily with breakfast.   allopurinol 100 MG tablet Commonly known as: ZYLOPRIM Take 1 tablet (100 mg total) by mouth daily. What changed:  medication strength how much to take   ALPRAZolam 1 MG tablet Commonly known as: XANAX Take 1 tablet (1 mg total) by mouth daily as needed for anxiety.   aspirin 81 MG EC tablet Take 1 tablet (81 mg total) by mouth daily.   atorvastatin 80 MG tablet Commonly known as: LIPITOR Take 1 tablet (80 mg total) by mouth daily.   blood glucose meter kit and supplies Dispense based on patient and insurance preference. Use up to four times daily as directed. (FOR ICD-10 E10.9, E11.9).   carvedilol 3.125 MG tablet Commonly known as: Coreg Take 1 tablet (3.125 mg total) by mouth 2 (two) times daily. What changed:  medication strength how much to take when to take this   Cool Blood Glucose Test Strips test strip Generic drug: glucose blood Use as instructed   dapsone 100 MG tablet TAKE 1 TABLET (100 MG TOTAL) BY MOUTH DAILY. What changed: how much to take   Dovato 50-300 MG Tabs Generic drug: Dolutegravir-lamiVUDine Take 1 tablet by mouth daily.   escitalopram 20 MG tablet Commonly known as: LEXAPRO Take 20 mg by mouth daily.   ferrous sulfate 325 (65 FE) MG tablet Take 1 tablet (325 mg total) by mouth every Monday, Wednesday, and Friday at 6 PM. Do not take with other medicines.    finasteride 5 MG tablet Commonly known as: PROSCAR Take 1 tablet (5 mg total) by mouth daily.   gabapentin 100 MG capsule Commonly known as: NEURONTIN Take 1 capsule (100 mg total) by mouth 3 (three) times daily.   nitroGLYCERIN 0.4 MG SL tablet Commonly known as: NITROSTAT PLACE 1 TABLET (0.4 MG TOTAL) UNDER THE TONGUE EVERY FIVE MINUTES X 3 DOSES AS NEEDED FOR CHEST PAIN. What changed: how much to take   onetouch ultrasoft lancets Use as instructed   oxybutynin 5 MG tablet Commonly known as: DITROPAN Take 1/2 tablet (2.5 mg total) by mouth 2 (two) times daily. What changed: how much to take   Oxycodone HCl 10 MG Tabs Take 1 tablet (10 mg total) by mouth every 6 (six) hours as needed (Severe pain.). What changed: reasons to take this   pantoprazole 40 MG tablet Commonly known as: PROTONIX Take 1 tablet (40 mg total) by mouth daily.   Pifeltro 100 MG Tabs tablet Generic drug: doravirine Take 1 tablet (100 mg total) by mouth daily.   traZODone 100 MG tablet Commonly known as: DESYREL Take 100 mg by mouth at bedtime.   Vascepa 1 g capsule Generic drug: icosapent Ethyl TAKE 2 CAPSULES (2 G TOTAL) BY MOUTH TWO TIMES DAILY. What changed:  how much to take how to take this when to take this               Durable Medical Equipment  (From admission, onward)           Start     Ordered   12/25/20 1548  For home use only DME lightweight manual wheelchair with seat cushion  Once       Comments: Patient suffers from [place] which impairs their ability to perform daily activities like dressing ,bathing in the home.  Lynsee Wands walker will not resolve  issue with performing activities of daily living. Siyona Coto wheelchair will allow patient  to safely perform daily activities. Patient is not able to propel themselves in the home using Sharman Garrott standard weight wheelchair due to general weakness. Patient can self propel in the lightweight wheelchair. Length of need Lifetime. Accessories:  elevating leg rests (ELRs), wheel locks, extensions and anti-tippers.   12/25/20 1550   12/25/20 1245  For home use only DME 3 n 1  Once       Comments: BARIATRIC BSC WITH DROP ARM   12/25/20 1247   12/25/20 1239  For home use only DME Hospital bed  Once       Question Answer Comment  Length of Need Lifetime   Patient has (list medical condition): S/P L BKA, Hx of HTN, CVA, DM, CKD   The above medical condition requires: Patient requires the ability to reposition frequently   Head must be elevated greater than: 30 degrees   Bed type Semi-electric   Hoyer Lift Yes   Support Surface: Gel Overlay      12/25/20 1244              Discharge Care Instructions  (From admission, onward)           Start     Ordered   12/26/20 0000  Discharge wound care:       Comments: leave the staples and stitches in 1 more week he will follow-up in the office in Yola Paradiso week.  Continue with 4 x 4 gauze Ace wrap wash with soap and water daily and apply the stump shrinker on top of the Ace wrap.  Patient will follow-up with [facility] for new prosthesis on his right leg.   12/26/20 1246   12/26/20 0000  Discharge wound care:       Comments: leave the staples and stitches in 1 more week he will follow-up in the office in Asma Boldon week.  Continue with 4 x 4 gauze Ace wrap wash with soap and water daily and apply the stump shrinker on top of the Ace wrap.  Patient will follow-up with [facility] for new prosthesis on his right leg.   12/26/20 1246           Allergies  Allergen Reactions   Elemental Sulfur Itching    Patient stated he's allergic to "sulfur" AND "sulfa"   Sulfa Antibiotics Itching    Follow-up Information     Suzan Slick, NP Follow up in 1 week(s).   Specialty: Orthopedic Surgery Contact information: [place] [address] [phone]         Gean Quint, MD. Schedule an appointment as soon as possible for Rage Beever visit in 2 week(s).   Specialty: Nephrology Contact  information: [place] [address] [phone]         Janith Lima, MD Follow up.   Specialty: Urology Why: call for follow up appointment for trial of void Contact information: [place] [address] [phone]         Carlyle Basques, MD Follow up.   Specialty: Infectious Diseases Why: Call for follow up appointment for medications Contact information: [place] [address] [phone]         [name], NP. Schedule an appointment as soon as possible for Sonnie Bias visit.   Specialty: Adult Health Nurse Practitioner Contact information: [address] [place]enee [place]er  [place] [address] [phone]         Jettie Booze, MD. Schedule an appointment as soon as  possible for Jeromie Gainor visit in 2 week(s).   Specialties: Cardiology, Radiology, Interventional Cardiology Contact information: [address] [place] [address] [phone]                  The results of significant diagnostics from this hospitalization (including imaging, microbiology, ancillary and laboratory) are listed below for reference.    Significant Diagnostic Studies: US RENAL  Result Date: 12/18/2020 CLINICAL DATA:  Acute renal insufficiency EXAM: RENAL / URINARY TRACT ULTRASOUND COMPLETE COMPARISON:  12/11/2020 FINDINGS: Right Kidney: Renal measurements: 11.7 x 6.0 x 4.8 cm = volume: 175 mL. Renal cortical echogenicity is diffusely mildly increased, in keeping with changes of underlying medical renal disease, unchanged from prior examination. Cortical thickness is preserved. No hydronephrosis. No intrarenal masses or calcifications are seen. Left Kidney: Renal measurements: 11.4 x 5.1 x 5.3 cm = volume: 160 mL. Similarly, renal cortical echogenicity is mildly increased, similar to prior examination, in keeping with underlying medical renal disease. Cortical thickness is preserved. No hydronephrosis. No intrarenal  masses or calcifications are seen. Bladder: Foley catheter balloon is seen within Shray Hunley decompressed bladder lumen. Other: None. IMPRESSION: Stable mild cortical hyperechogenicity in keeping with underlying medical renal disease. No hydronephrosis. Electronically Signed   By: Fidela Salisbury M.D.   On: 12/18/2020 16:07   US RENAL  Result Date: 12/11/2020 CLINICAL DATA:  Gross hematuria, history HIV/aids, coronary artery disease, stage IV chronic kidney disease, type II diabetes mellitus, hypertension EXAM: RENAL / URINARY TRACT ULTRASOUND COMPLETE COMPARISON:  11/01/2020 FINDINGS: Right Kidney: Renal measurements: 11.7 x 5.5 x 5.4 cm = volume: 183 mL. Normal cortical thickness. Slightly increased cortical echogenicity. No mass, hydronephrosis, or shadowing calcification. Left Kidney: Renal measurements: 11.3 x 5.0 x 5.8 cm = volume: 172 mL. Normal cortical thickness. Slightly increased cortical echogenicity. No mass, hydronephrosis, or shadowing calcification. Bladder: Foley catheter decompresses urinary bladder. Bladder wall appears thickened but this may be an artifact related to underdistention. Other: N/Harmoney Sienkiewicz IMPRESSION: Medical renal disease changes of both kidneys. No evidence of renal mass, hydronephrosis, or calculi. Electronically Signed   By: Lavonia Dana M.D.   On: 12/11/2020 16:37    Microbiology: Recent Results (from the past 240 hour(s))  SARS CORONAVIRUS 2 (TAT 6-24 HRS) Nasopharyngeal Nasopharyngeal Swab     Status: None   Collection Time: 12/17/20 12:25 PM   Specimen: Nasopharyngeal Swab  Result Value Ref Range Status   SARS Coronavirus 2 NEGATIVE NEGATIVE Final    Comment: (NOTE) SARS-CoV-2 target nucleic acids are NOT DETECTED.  The SARS-CoV-2 RNA is generally detectable in upper and lower respiratory specimens during the acute phase of infection. Negative results do not preclude SARS-CoV-2 infection, do not rule out co-infections with other pathogens, and should not be used as the sole  basis for treatment or other patient management decisions. Negative results must be combined with clinical observations, patient history, and epidemiological information. The expected result is Negative.  Fact Sheet for Patients: SugarRoll.be  Fact Sheet for Healthcare Providers: https://www.woods-mathews.com/  This test is not yet approved or cleared by the [place] FDA and  has been authorized for detection and/or diagnosis of SARS-CoV-2 by FDA under an Emergency Use Authorization (EUA). This EUA will remain  in effect (meaning this test can be used) for the duration of the COVID-19 declaration under Se ction 564(b)(1) of the Act, 21 U.S.C. section 360bbb-3(b)(1), unless the authorization is terminated or revoked sooner.  Performed at [facility], [address] [place],  [place] [address]      Labs: Basic Metabolic Panel: No results for input(s): NA, K, CL, CO2, GLUCOSE, BUN, CREATININE, CALCIUM, MG, PHOS in the last 168 hours. Liver Function Tests: No results for input(s): AST, ALT, ALKPHOS, BILITOT, PROT, ALBUMIN in the last 168 hours. No results for input(s): LIPASE, AMYLASE in the last 168 hours. No results for input(s): AMMONIA in the last 168 hours. CBC: No results for input(s): WBC, NEUTROABS, HGB, HCT, MCV, PLT in the last 168 hours. Cardiac Enzymes: No results for input(s): CKTOTAL, CKMB, CKMBINDEX, TROPONINI in the last 168 hours. BNP: BNP (last 3 results) No results for input(s): BNP in the last 8760 hours.  ProBNP (last 3 results) No results for input(s): PROBNP in the last 8760 hours.  CBG: Recent Labs  Lab 12/20/20 1623 12/20/20 2035 12/21/20 0628 12/25/20 2131 12/26/20 0701  GLUCAP 184* 126* 118* 104* 94       Signed:  Fayrene Helper MD.  [facility] 12/27/2020, 11:32 AM

## 2020-12-27 NOTE — Progress Notes (Signed)
PT Cancellation Note  Patient Details Name: Keith [name] MRN: [id number] DOB: Jan 23, 1966   Cancelled Treatment:    Reason Eval/Treat Not Completed: Other (comment) fast asleep this afternoon when I checked on him. Additionally, planning to DC this afternoon- all education and activities have been attempted and completed with patient. Feel he will likely do better with therapies in his home setting given significant cognition challenges during his hospital stay.   [name], DPT, PN2   Supplemental Physical Therapist [place]    Pager [phone] Acute Rehab Office [phone]

## 2020-12-27 NOTE — Consult Note (Signed)
   [facility] CM Inpatient Consult   12/27/2020  Keith [name] Sep 21, 1965 [id number]  Follow up:  Disposition changed to home with [facility] Patient with extreme high risk score and LLOS 70 days  Plan:  Refer back to [place] for community follow up.  MD office is listed for the [facility] follow up calls and appointments. Came by to speak with patient.  Currently, patient is sound asleep.  Will continue to refer for post hospital care management out reach.  For questions, please contact:  Natividad Brood, RN BSN [facility] Liaison  [phone] business mobile phone Toll free office [phone]  Fax number: [phone] Keith [name] [email or web address]

## 2020-12-27 NOTE — Plan of Care (Signed)
  Problem: Clinical Measurements: Goal: Ability to avoid or minimize complications of infection will improve Outcome: Completed/Met   Problem: Skin Integrity: Goal: Skin integrity will improve Outcome: Completed/Met   Problem: Clinical Measurements: Goal: Ability to maintain clinical measurements within normal limits will improve Outcome: Completed/Met Goal: Will remain free from infection Outcome: Completed/Met   Problem: Activity: Goal: Risk for activity intolerance will decrease Outcome: Completed/Met   Problem: Nutrition: Goal: Adequate nutrition will be maintained Outcome: Completed/Met   Problem: Elimination: Goal: Will not experience complications related to bowel motility Outcome: Completed/Met Goal: Will not experience complications related to urinary retention Outcome: Completed/Met   Problem: Pain Managment: Goal: General experience of comfort will improve Outcome: Completed/Met   Problem: Safety: Goal: Ability to remain free from injury will improve Outcome: Completed/Met   Problem: Skin Integrity: Goal: Risk for impaired skin integrity will decrease Outcome: Completed/Met

## 2020-12-28 ENCOUNTER — Other Ambulatory Visit: Payer: Self-pay

## 2020-12-28 ENCOUNTER — Telehealth: Payer: Self-pay | Admitting: Orthopedic Surgery

## 2020-12-28 DIAGNOSIS — Z89511 Acquired absence of right leg below knee: Secondary | ICD-10-CM

## 2020-12-28 DIAGNOSIS — D689 Coagulation defect, unspecified: Secondary | ICD-10-CM | POA: Diagnosis not present

## 2020-12-28 DIAGNOSIS — B20 Human immunodeficiency virus [HIV] disease: Secondary | ICD-10-CM

## 2020-12-28 DIAGNOSIS — N183 Chronic kidney disease, stage 3 unspecified: Secondary | ICD-10-CM

## 2020-12-28 DIAGNOSIS — I2510 Atherosclerotic heart disease of native coronary artery without angina pectoris: Secondary | ICD-10-CM | POA: Diagnosis not present

## 2020-12-28 DIAGNOSIS — I2121 ST elevation (STEMI) myocardial infarction involving left circumflex coronary artery: Secondary | ICD-10-CM

## 2020-12-28 DIAGNOSIS — E669 Obesity, unspecified: Secondary | ICD-10-CM | POA: Diagnosis not present

## 2020-12-28 DIAGNOSIS — T8781 Dehiscence of amputation stump: Secondary | ICD-10-CM

## 2020-12-28 DIAGNOSIS — Z794 Long term (current) use of insulin: Secondary | ICD-10-CM

## 2020-12-28 NOTE — Telephone Encounter (Signed)
Keith [name] from [facility] home [facility] states they have tried to contact pt and have not received an answer. Whenever they get a cb they'll send another approval for pt.   CB [phone]

## 2020-12-28 NOTE — Telephone Encounter (Signed)
This pt was d/c from the hospital 12/26/20 can you please call for appt next week?

## 2020-12-31 ENCOUNTER — Other Ambulatory Visit: Payer: Self-pay

## 2020-12-31 ENCOUNTER — Telehealth: Payer: Self-pay

## 2020-12-31 ENCOUNTER — Ambulatory Visit (INDEPENDENT_AMBULATORY_CARE_PROVIDER_SITE_OTHER): Payer: Medicare HMO

## 2020-12-31 DIAGNOSIS — Z5329 Procedure and treatment not carried out because of patient's decision for other reasons: Secondary | ICD-10-CM

## 2020-12-31 NOTE — Telephone Encounter (Signed)
Patient had a schedule appointment this morning. Call- patient no answer or voicemqil to leave a voicemail.

## 2021-01-01 ENCOUNTER — Encounter: Payer: Self-pay | Admitting: Orthopedic Surgery

## 2021-01-01 ENCOUNTER — Ambulatory Visit (INDEPENDENT_AMBULATORY_CARE_PROVIDER_SITE_OTHER): Payer: Medicare HMO | Admitting: Orthopedic Surgery

## 2021-01-01 DIAGNOSIS — L89899 Pressure ulcer of other site, unspecified stage: Secondary | ICD-10-CM

## 2021-01-01 DIAGNOSIS — T8781 Dehiscence of amputation stump: Secondary | ICD-10-CM

## 2021-01-01 DIAGNOSIS — T8789 Other complications of amputation stump: Secondary | ICD-10-CM

## 2021-01-01 DIAGNOSIS — Z89511 Acquired absence of right leg below knee: Secondary | ICD-10-CM

## 2021-01-01 DIAGNOSIS — Z89512 Acquired absence of left leg below knee: Secondary | ICD-10-CM

## 2021-01-01 NOTE — Progress Notes (Signed)
Office Visit Note   Patient: Keith [name]           Date of Birth: 1965/05/30           MRN: [name] Visit Date: 01/01/2021              Requested by: [name], NP [address] [place],  [place] [address] [name], NP  Chief Complaint  Patient presents with   Left Leg - Routine Post Op    12/02/20 revision left BKA       HPI: Patient is a 55 year old gentleman who is status post a right transtibial amputation and uses a prosthesis on the right.  He is most recently status post revision of a left transtibial amputation.  He is 4 weeks out from his index surgery.  Patient states that he messed up and has been having wound healing problems.  Patient is at home.  Assessment & Plan: Visit Diagnoses:  1. Hx of right BKA ([place])   2. Pressure ulcer of stump of below knee amputation ([place])   3. Dehiscence of amputation stump ([place])   4. Hx of BKA, left ([place])     Plan: Patient has complete dehiscence of the revise transtibial amputation.  Patient will require a urgent above-the-knee amputation the left.  We will set this up for Wednesday.  Anticipate patient will need discharge to skilled nursing postoperatively.  Follow-Up Instructions: Return in about 2 weeks (around 01/15/2021).   Ortho Exam  Patient is alert, oriented, no adenopathy, well-dressed, normal affect, normal respiratory effort. Examination there is dehiscence of the surgical incision there is significant amount of the hematoma drainage there is necrotic fascia and purulent drainage as well.  There are no gangrenous changes.  Imaging: No results found. No images are attached to the encounter.  Labs: Lab Results  Component Value Date   HGBA1C 5.9 (H) 11/29/2020   HGBA1C 13.9 (H) 08/29/2020   HGBA1C 10.6 (H) 05/19/2020   ESRSEDRATE 128 (H) 10/18/2020   ESRSEDRATE 85 (H) 10/07/2020   ESRSEDRATE 130 (H) 11/02/2018   CRP 11.2 (H) 10/18/2020   CRP 6.7 (H) 10/07/2020   CRP <0.8 11/02/2018    LABURIC 6.3 08/07/2009   REPTSTATUS 10/23/2020 FINAL 10/18/2020   GRAMSTAIN  04/29/2017    NO WBC SEEN RARE GRAM POSITIVE COCCI IN PAIRS IN SINGLES    CULT  10/18/2020    NO GROWTH 5 DAYS Performed at [facility], [place] [address], [place], [place] [address]    LABORGA METHICILLIN RESISTANT STAPHYLOCOCCUS AUREUS 04/29/2017     Lab Results  Component Value Date   ALBUMIN 2.5 (L) 12/19/2020   ALBUMIN 2.4 (L) 12/17/2020   ALBUMIN 2.3 (L) 12/16/2020   PREALBUMIN 25.5 06/06/2018   PREALBUMIN 7.4 (L) 03/15/2017    Lab Results  Component Value Date   MG 2.2 12/18/2020   MG 2.2 12/17/2020   MG 2.3 12/16/2020   Lab Results  Component Value Date   VD25OH 8.2 (L) 09/07/2018    Lab Results  Component Value Date   PREALBUMIN 25.5 06/06/2018   PREALBUMIN 7.4 (L) 03/15/2017   CBC EXTENDED Latest Ref Rng & Units 12/19/2020 12/18/2020 12/18/2020  WBC 4.0 - 10.5 K/uL 7.3 - 8.5  RBC 4.22 - 5.81 MIL/uL 2.55(L) - 2.51(L)  HGB 13.0 - 17.0 g/dL 7.5(L) 7.6(L) 7.2(L)  HCT 39.0 - 52.0 % 23.8(L) 24.0(L) 23.6(L)  PLT 150 - 400 K/uL 392 - 396  NEUTROABS 1.7 -  7.7 K/uL - - -  LYMPHSABS 0.7 - 4.0 K/uL - - -     There is no height or weight on file to calculate BMI.  Orders:  No orders of the defined types were placed in this encounter.  No orders of the defined types were placed in this encounter.    Procedures: No procedures performed  Clinical Data: No additional findings.  ROS:  All other systems negative, except as noted in the HPI. Review of Systems  Objective: Vital Signs: There were no vitals taken for this visit.  Specialty Comments:  No specialty comments available.  PMFS History: Patient Active Problem List   Diagnosis Date Noted   Coagulopathy ([place]) 12/22/2020   CAD (coronary artery disease) 12/22/2020   Obesity 12/22/2020   Gross hematuria 12/20/2020   ABLA (acute blood loss anemia) 12/20/2020   Dehiscence of amputation stump (HCC)    Hx of BKA, left  (HCC)    Cellulitis of left foot    Acute osteomyelitis of left calcaneus (HCC)    Ulcer of heel due to diabetes mellitus (HCC)    Normochromic normocytic anemia    Cellulitis 10/18/2020   Atypical chest pain 123XX123   Acute metabolic encephalopathy 123XX123   Hyperglycemia due to diabetes mellitus ([place]) 08/29/2020   Hyperglycemia 08/29/2020   Acute cerebrovascular accident (CVA) due to ischemia ([place]) 04/28/2020   Type 2 diabetes mellitus with hyperlipidemia ([place])    PAD (peripheral artery disease) ([place])    Hypoglycemia 04/25/2020   Acute ST elevation myocardial infarction (STEMI) due to occlusion of circumflex coronary artery ([place]) 03/22/2020   Uncontrolled type 2 diabetes mellitus with hyperglycemia ([place]) 01/14/2019   Elevated glucose 01/14/2019   Hx of BKA, right ([place]) 01/14/2019   Pressure injury of skin 11/11/2018   STEMI (ST elevation myocardial infarction) ([place]) 11/10/2018   CKD (chronic kidney disease) stage 4, GFR 15-29 ml/min ([place]) 11/10/2018   Amputation stump infection ([place]) 10/28/2018   Type II diabetes mellitus with renal manifestations ([place]) 08/07/2018   Uncontrolled type 2 diabetes mellitus with hyperosmolar nonketotic hyperglycemia ([place]) 08/07/2018   Non-pressure chronic ulcer of right calf limited to breakdown of skin ([place]) 07/06/2018   Type 2 diabetes mellitus without complication, without long-term current use of insulin ([place]) 06/15/2018   Chronic low back pain without sciatica 06/15/2018   Idiopathic chronic venous hypertension of left lower extremity with ulcer and inflammation (HCC)    Venous stasis ulcer of left calf limited to breakdown of skin without varicose veins ([place]) 04/23/2018   Acquired absence of right leg below knee ([place]) 06/13/2017   Acute kidney injury superimposed on CKD ([place]) 03/15/2017   Diabetic polyneuropathy associated with type 2 diabetes mellitus ([place]) 01/23/2017   AKI (acute kidney injury) ([place]) 09/28/2016   Nausea vomiting and  diarrhea 09/28/2016   Onychomycosis of multiple toenails with type 2 diabetes mellitus ([place]) 08/29/2015   MRSA carrier 04/20/2014   Penile wart 02/22/2014   HIV disease ([place])    Insulin-requiring or dependent type II diabetes mellitus ([place]) 02/04/2014   Dental anomaly 11/20/2012   Arthritis of right knee 02/23/2012   Hyperlipidemia 11/10/2011   Hyponatremia 11/10/2011   Chronic pain 08/07/2011   Meralgia paraesthetica 04/23/2011   ERECTILE DYSFUNCTION 08/22/2008   Elevated blood pressure reading with diagnosis of hypertension 05/19/2008   Past Medical History:  Diagnosis Date   Abscess of right foot    abscess/ulcer of R transtibial amputation requiring IV abx   AIDS ([place])    Anemia  CAD (coronary artery disease)    a. MI with stenting of OM1 in 11/2018 with residual disease. b. acute STEMI 03/2020 s/p DES to OM1   Chronic anemia    Chronic knee pain    right   Chronic pain    CKD (chronic kidney disease), stage IV (HCC)    CVA (cerebral vascular accident) ([place]) 04/2020   Diabetes type 2, uncontrolled ([place]osenberg)    HgA1c 17.6 (04/27/2010)   Diabetic foot ulcer ([place]) 01/2017   right foot   Dilatation of aorta (HCC)    Erectile dysfunction    Genital warts    HIV (human immunodeficiency virus infection) ([place]) 2009   CD4 count 100, VL 13800 (05/01/2010)   Hyperlipidemia    Hypertension    Noncompliance with medication regimen    Osteomyelitis (Cull[place])    h/o hand   Osteomyelitis of metatarsal ([place]) 04/28/2017    Family History  Problem Relation Age of Onset   Hypertension Mother    Arthritis Father    Hypertension Father    Hypertension Brother    Cancer Maternal Grandmother 30       unknown type of cancer   Depression Paternal Grandmother     Past Surgical History:  Procedure Laterality Date   AMPUTATION Right 05/02/2017   Procedure: AMPUTATION TRANSMETARSAL;  Surgeon: [name], MD;  Location: [facility];  Service: Orthopedics;  Laterality: Right;   AMPUTATION Right  06/13/2017   Procedure: RIGHT BELOW KNEE AMPUTATION;  Surgeon: [name], MD;  Location: [facility];  Service: Orthopedics;  Laterality: Right;   AMPUTATION Left 10/27/2020   Procedure: LEFT BELOW KNEE AMPUTATION;  Surgeon: [name], MD;  Location: [facility];  Service: Orthopedics;  Laterality: Left;   AMPUTATION Left 11/17/2020   Procedure: REVISION AMPUTATION BELOW KNEE, LEFT;  Surgeon: [name], MD;  Location: [facility];  Service: Orthopedics;  Laterality: Left;   APPLICATION OF WOUND VAC Left 10/27/2020   Procedure: APPLICATION OF WOUND VAC;  Surgeon: [name], MD;  Location: [facility];  Service: Orthopedics;  Laterality: Left;   BELOW KNEE LEG AMPUTATION Right 06/13/2017   BELOW KNEE LEG AMPUTATION     CORONARY STENT INTERVENTION N/A 11/10/2018   Procedure: CORONARY STENT INTERVENTION;  Surgeon: Leonie Man, MD;  Location: [facility];  Service: Cardiovascular;  Laterality: N/A;   CORONARY/GRAFT ACUTE MI REVASCULARIZATION N/A 03/21/2020   Procedure: Coronary/Graft Acute MI Revascularization;  Surgeon: [name], MD;  Location: [facility];  Service: Cardiovascular;  Laterality: N/A;   CYSTOSCOPY N/A 11/09/2020   Procedure: CYSTOSCOPY, EVACUATION OF CLOTS, FULGERATION OF BLADDER AND BILATERIAL RETROGRADE PYELOGRAMS;  Surgeon: Janith Lima, MD;  Location: [facility];  Service: Urology;  Laterality: N/A;   HERNIA REPAIR     I & D EXTREMITY Left 08/21/2014   Procedure: INCISION AND DRAINAGE LEFT SMALL FINGER;  Surgeon: Leanora Cover, MD;  Location: [facility];  Service: Orthopedics;  Laterality: Left;   I & D EXTREMITY Right 03/18/2017   Procedure: IRRIGATION AND DEBRIDEMENT EXTREMITY;  Surgeon: [name], MD;  Location: [facility];  Service: Orthopedics;  Laterality: Right;   IR FLUORO GUIDE CV LINE RIGHT  11/02/2020   IR REMOVAL TUN CV CATH W/O FL  11/13/2020   IR US GUIDE VASC ACCESS RIGHT  11/02/2020   LEFT HEART CATH AND CORONARY ANGIOGRAPHY N/A 11/10/2018   Procedure: LEFT  HEART CATH AND CORONARY ANGIOGRAPHY;  Surgeon: Leonie Man, MD;  Location: [facility];  Service: Cardiovascular;  Laterality: N/A;   LEFT HEART CATH AND CORONARY ANGIOGRAPHY N/A 03/21/2020   Procedure: LEFT HEART CATH AND CORONARY ANGIOGRAPHY;  Surgeon: [name], MD;  Location: [facility];  Service: Cardiovascular;  Laterality: N/A;   MINOR IRRIGATION AND DEBRIDEMENT OF WOUND Right 04/22/2014   Procedure: IRRIGATION AND DEBRIDEMENT OF RIGHT NECK ABCESS;  Surgeon: Jerrell Belfast, MD;  Location: [facility];  Service: ENT;  Laterality: Right;   MULTIPLE EXTRACTIONS WITH ALVEOLOPLASTY N/A 01/18/2013   Procedure: MULTIPLE EXTRACION 3, 6, 7, 10, 11, 13, 21, 22, 27, 28, 29, 30 WITH ALVEOLOPLASTY;  Surgeon: Gae Bon, DDS;  Location: [facility];  Service: Oral Surgery;  Laterality: N/A;   SKIN SPLIT GRAFT Right 03/21/2017   Procedure: IRRIGATION AND DEBRIDEMENT RIGHT FOOT AND APPLY SPLIT THICKNESS SKIN GRAFT AND WOUND VAC;  Surgeon: [name], MD;  Location: [facility];  Service: Orthopedics;  Laterality: Right;   STUMP REVISION Left 12/02/2020   Procedure: REVISION LEFT BELOW KNEE AMPUTATION;  Surgeon: [name], MD;  Location: [facility];  Service: Orthopedics;  Laterality: Left;   TEE WITHOUT CARDIOVERSION N/A 05/02/2017   Procedure: TRANSESOPHAGEAL ECHOCARDIOGRAM (TEE);  Surgeon: Acie Fredrickson Wonda Cheng, MD;  Location: [facility];  Service: Cardiovascular;  Laterality: N/A;  coincidental to orthopedic case   Social History   Occupational History   Occupation: disabled  Tobacco Use   Smoking status: Never   Smokeless tobacco: Never  Vaping Use   Vaping Use: Never used  Substance and Sexual Activity   Alcohol use: Not Currently   Drug use: Not Currently    Comment: OTC ASA   Sexual activity: Yes    Partners: Female

## 2021-01-02 ENCOUNTER — Other Ambulatory Visit: Payer: Self-pay | Admitting: Physician Assistant

## 2021-01-02 ENCOUNTER — Other Ambulatory Visit: Payer: Self-pay

## 2021-01-02 ENCOUNTER — Encounter (HOSPITAL_COMMUNITY): Payer: Self-pay | Admitting: Orthopedic Surgery

## 2021-01-02 ENCOUNTER — Other Ambulatory Visit: Payer: Self-pay | Admitting: *Deleted

## 2021-01-02 NOTE — Patient Outreach (Signed)
[facility]) Care Management  01/02/2021  RODARIUS KICHLINE 1965-11-02 [id number]   [facility] incoming call from post hospital referred patient  [name] was referred to [facility] on 12/28/20 for post hospital and screening for complex care services by [facility] hospital liaison Eliott Nine Previously active with [facility] in July 2022 with case closure for not being able to maintain contact and 10+ admission stay for Left BKA (Below knee amputation)   Insurance [employer] medicare, [place]   RN CM received a call from [place], Mickel Baas after patient's mother called the main [facility] office  Mrs Nasif Bos is able to verify Mr Palazzola's HIPAA identifiers  Assessment  Mr Ormond lives with his mother and is pending orthopedic procedure on 01/03/21. Today he is doing well Mrs Marlett reports she is responding to previous outreaches from previous [place] and states she anticipated another call from [facility]. She wanted to call to update RN CM that Mr Dollar has not managed well since his discharge home and his wound is not doing well. "It opened up" She reports a visit to Dr Sharol Given office on 01/01/21 and Mr Majette will be requiring urgent left above the knee amputation on 01/03/21.   RN CM connected Mrs Gawron with [facility] [place] [facility] [phone] to have further questions answered that RN CM does not have access to in EPIC  She was informed of possible [facility] outreach pending patient hospital disposition and voices understanding   Patient Active Problem List   Diagnosis Date Noted   Coagulopathy ([place]) 12/22/2020   CAD (coronary artery disease) 12/22/2020   Obesity 12/22/2020   Gross hematuria 12/20/2020   ABLA (acute blood loss anemia) 12/20/2020   Dehiscence of amputation stump (HCC)    Hx of BKA, left ([place])    Cellulitis of left foot    Acute osteomyelitis of left calcaneus ([place])    Ulcer of heel due to diabetes mellitus ([place])    Normochromic normocytic anemia    Cellulitis 10/18/2020   Atypical chest pain  13/24/4010   Acute metabolic encephalopathy 27/25/3664   Hyperglycemia due to diabetes mellitus ([place]) 08/29/2020   Hyperglycemia 08/29/2020   Acute cerebrovascular accident (CVA) due to ischemia ([place]) 04/28/2020   Type 2 diabetes mellitus with hyperlipidemia (HCC)    PAD (peripheral artery disease) ([place])    Hypoglycemia 04/25/2020   Acute ST elevation myocardial infarction (STEMI) due to occlusion of circumflex coronary artery ([place]) 03/22/2020   Uncontrolled type 2 diabetes mellitus with hyperglycemia (Shel[place]) 01/14/2019   Elevated glucose 01/14/2019   Hx of BKA, right ([place]) 01/14/2019   Pressure injury of skin 11/11/2018   STEMI (ST elevation myocardial infarction) ([place]) 11/10/2018   CKD (chronic kidney disease) stage 4, GFR 15-29 ml/min ([place]ar[place]ville) 11/10/2018   Amputation stump infection ([place]) 10/28/2018   Type II diabetes mellitus with renal manifestations ([place]) 08/07/2018   Uncontrolled type 2 diabetes mellitus with hyperosmolar nonketotic hyperglycemia ([place]) 08/07/2018   Non-pressure chronic ulcer of right calf limited to breakdown of skin ([place]) 07/06/2018   Type 2 diabetes mellitus without complication, without long-term current use of insulin ([place]) 06/15/2018   Chronic low back pain without sciatica 06/15/2018   Idiopathic chronic venous hypertension of left lower extremity with ulcer and inflammation (HCC)    Venous stasis ulcer of left calf limited to breakdown of skin without varicose veins ([place]) 04/23/2018   Acquired absence of right leg below knee ([place]) 06/13/2017   Acute kidney injury superimposed on  CKD ([place]) 03/15/2017   Diabetic polyneuropathy associated with type 2 diabetes mellitus ([place]) 01/23/2017   AKI (acute kidney injury) ([place]) 09/28/2016   Nausea vomiting and diarrhea 09/28/2016   Onychomycosis of multiple toenails with type 2 diabetes mellitus ([place]) 08/29/2015   MRSA carrier 04/20/2014   Penile wart 02/22/2014   HIV disease ([place])    Insulin-requiring or dependent type  II diabetes mellitus ([place]) 02/04/2014   Dental anomaly 11/20/2012   Arthritis of right knee 02/23/2012   Hyperlipidemia 11/10/2011   Hyponatremia 11/10/2011   Chronic pain 08/07/2011   Meralgia paraesthetica 04/23/2011   ERECTILE DYSFUNCTION 08/22/2008   Elevated blood pressure reading with diagnosis of hypertension 05/19/2008   Past Medical History:  Diagnosis Date   Abscess of right foot    abscess/ulcer of R transtibial amputation requiring IV abx   AIDS ([place])    Anemia    CAD (coronary artery disease)    a. MI with stenting of OM1 in 11/2018 with residual disease. b. acute STEMI 03/2020 s/p DES to OM1   Chronic anemia    Chronic knee pain    right   Chronic pain    CKD (chronic kidney disease), stage IV (HCC)    CVA (cerebral vascular accident) ([place]) 04/2020   Diabetes type 2, uncontrolled ([place])    HgA1c 17.6 (04/27/2010)   Diabetic foot ulcer ([place]) 01/2017   right foot   Dilatation of aorta (HCC)    Erectile dysfunction    Genital warts    HIV (human immunodeficiency virus infection) ([place]) 2009   CD4 count 100, VL 13800 (05/01/2010)   Hyperlipidemia    Hypertension    Noncompliance with medication regimen    Osteomyelitis ([place])    h/o hand   Osteomyelitis of metatarsal ([place]) 04/28/2017   Current Outpatient Medications on File Prior to Visit  Medication Sig Dispense Refill   acetaminophen (TYLENOL) 325 MG tablet Take 2 tablets (650 mg total) by mouth every 4 (four) hours as needed for mild pain, fever or headache.     alfuzosin (UROXATRAL) 10 MG 24 hr tablet Take 1 tablet (10 mg total) by mouth daily with breakfast. 30 tablet 1   allopurinol (ZYLOPRIM) 100 MG tablet Take 1 tablet (100 mg total) by mouth daily. 30 tablet 1   ALPRAZolam (XANAX) 1 MG tablet Take 1 tablet (1 mg total) by mouth daily as needed for anxiety. 10 tablet 0   aspirin 81 MG EC tablet Take 1 tablet (81 mg total) by mouth daily. 30 tablet 12   atorvastatin (LIPITOR) 80 MG tablet Take 1 tablet (80  mg total) by mouth daily. 30 tablet 1   blood glucose meter kit and supplies Dispense based on patient and insurance preference. Use up to four times daily as directed. (FOR ICD-10 E10.9, E11.9). 1 each 0   carvedilol (COREG) 3.125 MG tablet Take 1 tablet (3.125 mg total) by mouth 2 (two) times daily. 60 tablet 1   dapsone 100 MG tablet TAKE 1 TABLET (100 MG TOTAL) BY MOUTH DAILY. (Patient taking differently: Take 100 mg by mouth daily.) 30 tablet 0   Dolutegravir-lamiVUDine (DOVATO) 50-300 MG TABS Take 1 tablet by mouth daily. 30 tablet 0   doravirine (PIFELTRO) 100 MG TABS tablet Take 1 tablet (100 mg total) by mouth daily. 30 tablet 0   escitalopram (LEXAPRO) 20 MG tablet Take 20 mg by mouth daily.     ferrous sulfate 325 (65 FE) MG tablet Take 1 tablet (325 mg total) by mouth  every Monday, Wednesday, and Friday at 6 PM. Do not take with other medicines. 12 tablet 0   finasteride (PROSCAR) 5 MG tablet Take 1 tablet (5 mg total) by mouth daily. 30 tablet 1   gabapentin (NEURONTIN) 100 MG capsule Take 1 capsule (100 mg total) by mouth 3 (three) times daily. 90 capsule 1   glucose blood (COOL BLOOD GLUCOSE TEST STRIPS) test strip Use as instructed 100 each 0   icosapent Ethyl (VASCEPA) 1 g capsule TAKE 2 CAPSULES (2 G TOTAL) BY MOUTH TWO TIMES DAILY. (Patient taking differently: Take 2 g by mouth 2 (two) times daily.) 120 capsule 3   Lancets (ONETOUCH ULTRASOFT) lancets Use as instructed 100 each 12   nitroGLYCERIN (NITROSTAT) 0.4 MG SL tablet PLACE 1 TABLET (0.4 MG TOTAL) UNDER THE TONGUE EVERY FIVE MINUTES X 3 DOSES AS NEEDED FOR CHEST PAIN. (Patient taking differently: Place 0.4 mg under the tongue every 5 (five) minutes x 3 doses as needed for chest pain.) 25 tablet 12   oxybutynin (DITROPAN) 5 MG tablet Take 1/2 tablet (2.5 mg total) by mouth 2 (two) times daily. 30 tablet 1   Oxycodone HCl 10 MG TABS Take 1 tablet (10 mg total) by mouth every 6 (six) hours as needed (Severe pain.). 10 tablet 0    pantoprazole (PROTONIX) 40 MG tablet Take 1 tablet (40 mg total) by mouth daily. 30 tablet 0   traZODone (DESYREL) 100 MG tablet Take 100 mg by mouth at bedtime.     No current facility-administered medications on file prior to visit.    Plans RN CM will collaborate with [facility] hospital liaison on how to continue to serve this patient pending pt disposition/hospital plan of care  Jaiven Graveline L. [name], RN, BSN, [place] Coordinator Office number [phone] [facility] number [phone] Fax number [phone]

## 2021-01-02 NOTE — Progress Notes (Addendum)
[name] denies chest pain or shortness of breath.  Keith [name] denies having any s/s of Covid in his household.  Keith [name] denies any known exposure to Covid.   Keith [name] has a  history of Diabetes.  Keith [name] states that CBGs run 114, 125; highest was 199.  Keith [name] is not longer on medications for diabetes.A1C in August was 5.9. I instructed Keith [name] to check CBG after awaking and every 2 hours until arrival  to the hospital.  I Instructed Keith [name] if CBG is less than 70 to take 4 Glucose Tablets or 1 tube of Glucose Gel or 1/2 cup of a clear juice. Recheck CBG in 15 minutes if CBG is not over 70 call, pre- op desk at [phone] for further instructions.   I instructed Keith [name] to shower with antibiotic soap, if it is available.  Dry off with a clean towel. Do not put lotion, powder, cologne or deodorant or makeup.No jewelry or piercings. Men may shave their face and neck. Woman should not shave. No nail polish, artificial or acrylic nails. Wear clean clothes, brush your teeth. Glasses, contact lens,dentures or partials may not be worn in the OR. If you need to wear them, please bring a case for glasses, do not wear contacts or bring a case, the hospital does not have contact cases, dentures or partials will have to be removed , make sure they are clean, we will provide a denture cup to put them in.   Transportation is bring Keith [name] to the hospital, Keith [name] does not know what time they are picking him up . I called [facility] transportation and asked for Keith [name] to be brought to [facility] by [phone]. I was informed that a dispatcher will call Keith [name] today and tell Keith [name] what time they will pick him up.

## 2021-01-02 NOTE — H&P (Signed)
Keith [name] is an 55 y.o. male.   Chief Complaint: Left Below knee Amputation Wound Dehiscence HPI: HPI: Patient is a 55 year old gentleman who is status post a right transtibial amputation and uses a prosthesis on the right.  He is most recently status post revision of a left transtibial amputation.  He is 4 weeks out from his index surgery.  Patient states that he messed up and has been having wound healing problems.  Patient is at home.  Past Medical History:  Diagnosis Date   Abscess of right foot    abscess/ulcer of R transtibial amputation requiring IV abx   AIDS ([place])    Anemia    CAD (coronary artery disease)    a. MI with stenting of OM1 in 11/2018 with residual disease. b. acute STEMI 03/2020 s/p DES to OM1   Chronic anemia    Chronic knee pain    right   Chronic pain    CKD (chronic kidney disease), stage IV (HCC)    CVA (cerebral vascular accident) ([place]) 04/2020   Diabetes type 2, uncontrolled ([place])    HgA1c 17.6 (04/27/2010)   Diabetic foot ulcer ([place]) 01/2017   right foot   Dilatation of aorta (HCC)    Erectile dysfunction    Genital warts    HIV (human immunodeficiency virus infection) ([place]) 2009   CD4 count 100, VL 13800 (05/01/2010)   Hyperlipidemia    Hypertension    Noncompliance with medication regimen    Osteomyelitis ([place])    h/o hand   Osteomyelitis of metatarsal ([place]) 04/28/2017    Past Surgical History:  Procedure Laterality Date   AMPUTATION Right 05/02/2017   Procedure: AMPUTATION TRANSMETARSAL;  Surgeon: [name], MD;  Location: [place];  Service: Orthopedics;  Laterality: Right;   AMPUTATION Right 06/13/2017   Procedure: RIGHT BELOW KNEE AMPUTATION;  Surgeon: [name], MD;  Location: [place];  Service: Orthopedics;  Laterality: Right;   AMPUTATION Left 10/27/2020   Procedure: LEFT BELOW KNEE AMPUTATION;  Surgeon: [name], MD;  Location: [place];  Service: Orthopedics;  Laterality: Left;   AMPUTATION Left 11/17/2020   Procedure:  REVISION AMPUTATION BELOW KNEE, LEFT;  Surgeon: [name], MD;  Location: [place];  Service: Orthopedics;  Laterality: Left;   APPLICATION OF WOUND VAC Left 10/27/2020   Procedure: APPLICATION OF WOUND VAC;  Surgeon: [name], MD;  Location: [place];  Service: Orthopedics;  Laterality: Left;   BELOW KNEE LEG AMPUTATION Right 06/13/2017   BELOW KNEE LEG AMPUTATION     CORONARY STENT INTERVENTION N/A 11/10/2018   Procedure: CORONARY STENT INTERVENTION;  Surgeon: Leonie Man, MD;  Location: [place] [facility];  Service: Cardiovascular;  Laterality: N/A;   CORONARY/GRAFT ACUTE MI REVASCULARIZATION N/A 03/21/2020   Procedure: Coronary/Graft Acute MI Revascularization;  Surgeon: [name], MD;  Location: [place] [facility];  Service: Cardiovascular;  Laterality: N/A;   CYSTOSCOPY N/A 11/09/2020   Procedure: CYSTOSCOPY, EVACUATION OF CLOTS, FULGERATION OF BLADDER AND BILATERIAL RETROGRADE PYELOGRAMS;  Surgeon: Janith Lima, MD;  Location: [place];  Service: Urology;  Laterality: N/A;   HERNIA REPAIR     I & D EXTREMITY Left 08/21/2014   Procedure: INCISION AND DRAINAGE LEFT SMALL FINGER;  Surgeon: Leanora Cover, MD;  Location: [place];  Service: Orthopedics;  Laterality: Left;   I & D EXTREMITY Right 03/18/2017   Procedure: IRRIGATION AND DEBRIDEMENT EXTREMITY;  Surgeon: [name], MD;  Location: [place];  Service: Orthopedics;  Laterality: Right;   IR FLUORO GUIDE CV LINE RIGHT  11/02/2020   IR REMOVAL TUN CV CATH W/O FL  11/13/2020   IR US GUIDE VASC ACCESS RIGHT  11/02/2020   LEFT HEART CATH AND CORONARY ANGIOGRAPHY N/A 11/10/2018   Procedure: LEFT HEART CATH AND CORONARY ANGIOGRAPHY;  Surgeon: Leonie Man, MD;  Location: [place] [facility];  Service: Cardiovascular;  Laterality: N/A;   LEFT HEART CATH AND CORONARY ANGIOGRAPHY N/A 03/21/2020   Procedure: LEFT HEART CATH AND CORONARY ANGIOGRAPHY;  Surgeon: [name], MD;  Location: [place] [facility];  Service: Cardiovascular;   Laterality: N/A;   MINOR IRRIGATION AND DEBRIDEMENT OF WOUND Right 04/22/2014   Procedure: IRRIGATION AND DEBRIDEMENT OF RIGHT NECK ABCESS;  Surgeon: Jerrell Belfast, MD;  Location: [place];  Service: ENT;  Laterality: Right;   MULTIPLE EXTRACTIONS WITH ALVEOLOPLASTY N/A 01/18/2013   Procedure: MULTIPLE EXTRACION 3, 6, 7, 10, 11, 13, 21, 22, 27, 28, 29, 30 WITH ALVEOLOPLASTY;  Surgeon: Gae Bon, DDS;  Location: [place];  Service: Oral Surgery;  Laterality: N/A;   SKIN SPLIT GRAFT Right 03/21/2017   Procedure: IRRIGATION AND DEBRIDEMENT RIGHT FOOT AND APPLY SPLIT THICKNESS SKIN GRAFT AND WOUND VAC;  Surgeon: [name], MD;  Location: [place];  Service: Orthopedics;  Laterality: Right;   STUMP REVISION Left 12/02/2020   Procedure: REVISION LEFT BELOW KNEE AMPUTATION;  Surgeon: [name], MD;  Location: [place];  Service: Orthopedics;  Laterality: Left;   TEE WITHOUT CARDIOVERSION N/A 05/02/2017   Procedure: TRANSESOPHAGEAL ECHOCARDIOGRAM (TEE);  Surgeon: Acie Fredrickson Wonda Cheng, MD;  [facility];  Service: Cardiovascular;  Laterality: N/A;  coincidental to orthopedic case    Family History  Problem Relation Age of Onset   Hypertension Mother    Arthritis Father    Hypertension Father    Hypertension Brother    Cancer Maternal Grandmother 69       unknown type of cancer   Depression Paternal Grandmother    Social History:  reports that he has never smoked. He has never used smokeless tobacco. He reports that he does not currently use alcohol. He reports that he does not currently use drugs.  Allergies:  Allergies  Allergen Reactions   Elemental Sulfur Itching    Patient stated he's allergic to "sulfur" AND "sulfa"   Sulfa Antibiotics Itching    No medications prior to admission.    No results found for this or any previous visit (from the past 48 hour(s)). No results found.  Review of Systems  All other systems reviewed and are negative.  There were no vitals taken for this  visit. Physical Exam  Patient is alert, oriented, no adenopathy, well-dressed, normal affect, normal respiratory effort. Examination there is dehiscence of the surgical incision there is significant amount of the hematoma drainage there is necrotic fascia and purulent drainage as well.  There are no gangrenous changes.Heart RRR Lungs Clear Assessment/Plan Visit Diagnoses:  1. Hx of right BKA ([place])   2. Pressure ulcer of stump of below knee amputation ([place])   3. Dehiscence of amputation stump ([place])   4. Hx of BKA, left ([place])       Plan: Patient has complete dehiscence of the revise transtibial amputation.  Patient will require a urgent above-the-knee amputation the left.  We will set this up for Wednesday.  Anticipate patient will need discharge to skilled nursing postoperatively.  Bevely Palmer [place] Sawatzky, PA 01/02/2021, 12:13 PM

## 2021-01-03 ENCOUNTER — Encounter (HOSPITAL_COMMUNITY): Payer: Self-pay | Admitting: Orthopedic Surgery

## 2021-01-03 ENCOUNTER — Other Ambulatory Visit: Payer: Self-pay

## 2021-01-03 ENCOUNTER — Inpatient Hospital Stay (HOSPITAL_COMMUNITY): Payer: Medicare HMO | Admitting: Anesthesiology

## 2021-01-03 ENCOUNTER — Encounter (HOSPITAL_COMMUNITY)
Admission: RE | Disposition: A | Discharge status: Skilled Nursing Facility | Payer: Self-pay | Source: Home / Self Care | Attending: Orthopedic Surgery

## 2021-01-03 ENCOUNTER — Inpatient Hospital Stay (HOSPITAL_COMMUNITY)
Admission: RE | Admit: 2021-01-03 | Discharge: 2021-01-16 | DRG: 475 | Disposition: A | Discharge status: Skilled Nursing Facility | Payer: Medicare HMO | Attending: Orthopedic Surgery | Admitting: Orthopedic Surgery

## 2021-01-03 DIAGNOSIS — I2510 Atherosclerotic heart disease of native coronary artery without angina pectoris: Secondary | ICD-10-CM | POA: Diagnosis not present

## 2021-01-03 DIAGNOSIS — Z20822 Contact with and (suspected) exposure to covid-19: Secondary | ICD-10-CM | POA: Diagnosis present

## 2021-01-03 DIAGNOSIS — I10 Essential (primary) hypertension: Secondary | ICD-10-CM | POA: Diagnosis not present

## 2021-01-03 DIAGNOSIS — T8130XA Disruption of wound, unspecified, initial encounter: Secondary | ICD-10-CM | POA: Diagnosis present

## 2021-01-03 DIAGNOSIS — B20 Human immunodeficiency virus [HIV] disease: Secondary | ICD-10-CM | POA: Diagnosis present

## 2021-01-03 DIAGNOSIS — I252 Old myocardial infarction: Secondary | ICD-10-CM | POA: Diagnosis not present

## 2021-01-03 DIAGNOSIS — Y835 Amputation of limb(s) as the cause of abnormal reaction of the patient, or of later complication, without mention of misadventure at the time of the procedure: Secondary | ICD-10-CM | POA: Diagnosis present

## 2021-01-03 DIAGNOSIS — N184 Chronic kidney disease, stage 4 (severe): Secondary | ICD-10-CM | POA: Diagnosis present

## 2021-01-03 DIAGNOSIS — E1122 Type 2 diabetes mellitus with diabetic chronic kidney disease: Secondary | ICD-10-CM | POA: Diagnosis present

## 2021-01-03 DIAGNOSIS — I129 Hypertensive chronic kidney disease with stage 1 through stage 4 chronic kidney disease, or unspecified chronic kidney disease: Secondary | ICD-10-CM | POA: Diagnosis not present

## 2021-01-03 DIAGNOSIS — E785 Hyperlipidemia, unspecified: Secondary | ICD-10-CM | POA: Diagnosis present

## 2021-01-03 DIAGNOSIS — R69 Illness, unspecified: Secondary | ICD-10-CM | POA: Diagnosis not present

## 2021-01-03 DIAGNOSIS — Z89512 Acquired absence of left leg below knee: Secondary | ICD-10-CM | POA: Diagnosis not present

## 2021-01-03 DIAGNOSIS — Z955 Presence of coronary angioplasty implant and graft: Secondary | ICD-10-CM | POA: Diagnosis not present

## 2021-01-03 DIAGNOSIS — Z8261 Family history of arthritis: Secondary | ICD-10-CM

## 2021-01-03 DIAGNOSIS — Z89511 Acquired absence of right leg below knee: Secondary | ICD-10-CM | POA: Diagnosis not present

## 2021-01-03 DIAGNOSIS — Z8249 Family history of ischemic heart disease and other diseases of the circulatory system: Secondary | ICD-10-CM

## 2021-01-03 DIAGNOSIS — E871 Hypo-osmolality and hyponatremia: Secondary | ICD-10-CM | POA: Diagnosis not present

## 2021-01-03 DIAGNOSIS — Z8673 Personal history of transient ischemic attack (TIA), and cerebral infarction without residual deficits: Secondary | ICD-10-CM | POA: Diagnosis not present

## 2021-01-03 DIAGNOSIS — Z743 Need for continuous supervision: Secondary | ICD-10-CM | POA: Diagnosis not present

## 2021-01-03 DIAGNOSIS — T8781 Dehiscence of amputation stump: Principal | ICD-10-CM

## 2021-01-03 HISTORY — DX: Acute myocardial infarction, unspecified: I21.9

## 2021-01-03 HISTORY — DX: Gastro-esophageal reflux disease without esophagitis: K21.9

## 2021-01-03 HISTORY — DX: Pneumonia, unspecified organism: J18.9

## 2021-01-03 HISTORY — PX: AMPUTATION: SHX166

## 2021-01-03 HISTORY — DX: Polyneuropathy, unspecified: G62.9

## 2021-01-03 HISTORY — DX: Personal history of other medical treatment: Z92.89

## 2021-01-03 LAB — SARS CORONAVIRUS 2 BY RT PCR (HOSPITAL ORDER, PERFORMED IN ~~LOC~~ HOSPITAL LAB): SARS Coronavirus 2: NEGATIVE

## 2021-01-03 LAB — CBC
HCT: 26.6 % — ABNORMAL LOW (ref 39.0–52.0)
Hemoglobin: 8 g/dL — ABNORMAL LOW (ref 13.0–17.0)
MCH: 28.1 pg (ref 26.0–34.0)
MCHC: 30.1 g/dL (ref 30.0–36.0)
MCV: 93.3 fL (ref 80.0–100.0)
Platelets: 401 10*3/uL — ABNORMAL HIGH (ref 150–400)
RBC: 2.85 MIL/uL — ABNORMAL LOW (ref 4.22–5.81)
RDW: 15.9 % — ABNORMAL HIGH (ref 11.5–15.5)
WBC: 8.8 10*3/uL (ref 4.0–10.5)
nRBC: 0 % (ref 0.0–0.2)

## 2021-01-03 LAB — SURGICAL PCR SCREEN
MRSA, PCR: NEGATIVE
Staphylococcus aureus: NEGATIVE

## 2021-01-03 LAB — BASIC METABOLIC PANEL
Anion gap: 9 (ref 5–15)
BUN: 41 mg/dL — ABNORMAL HIGH (ref 6–20)
CO2: 20 mmol/L — ABNORMAL LOW (ref 22–32)
Calcium: 9.3 mg/dL (ref 8.9–10.3)
Chloride: 104 mmol/L (ref 98–111)
Creatinine, Ser: 2.04 mg/dL — ABNORMAL HIGH (ref 0.61–1.24)
GFR, Estimated: 38 mL/min — ABNORMAL LOW (ref >60)
Glucose, Bld: 111 mg/dL — ABNORMAL HIGH (ref 70–99)
Potassium: 4.5 mmol/L (ref 3.5–5.1)
Sodium: 133 mmol/L — ABNORMAL LOW (ref 135–145)

## 2021-01-03 LAB — GLUCOSE, CAPILLARY
Glucose-Capillary: 119 mg/dL — ABNORMAL HIGH (ref 70–99)
Glucose-Capillary: 117 mg/dL — ABNORMAL HIGH (ref 70–99)
Glucose-Capillary: 141 mg/dL — ABNORMAL HIGH (ref 70–99)
Glucose-Capillary: 133 mg/dL — ABNORMAL HIGH (ref 70–99)

## 2021-01-03 LAB — PREPARE RBC (CROSSMATCH)

## 2021-01-03 SURGERY — AMPUTATION, ABOVE KNEE
Anesthesia: General | Site: Knee | Laterality: Left

## 2021-01-03 MED ORDER — FINASTERIDE 5 MG PO TABS
5.0000 mg | ORAL_TABLET | Freq: Every day | ORAL | Status: DC
  Administered 2021-01-04 – 2021-01-16 (×13): 5 mg via ORAL
  Filled 2021-01-03 (×13): qty 1

## 2021-01-03 MED ORDER — DOCUSATE SODIUM 100 MG PO CAPS
100.0000 mg | ORAL_CAPSULE | Freq: Every day | ORAL | Status: DC
  Administered 2021-01-04 – 2021-01-16 (×12): 100 mg via ORAL
  Filled 2021-01-03 (×13): qty 1

## 2021-01-03 MED ORDER — CHLORHEXIDINE GLUCONATE 0.12 % MT SOLN
15.0000 mL | Freq: Once | OROMUCOSAL | Status: AC
  Administered 2021-01-03: 15 mL via OROMUCOSAL
  Filled 2021-01-03: qty 15

## 2021-01-03 MED ORDER — HYDROMORPHONE HCL 1 MG/ML IJ SOLN
0.2500 mg | INTRAMUSCULAR | Status: DC | PRN
  Administered 2021-01-03 (×3): 0.5 mg via INTRAVENOUS

## 2021-01-03 MED ORDER — ALPRAZOLAM 0.5 MG PO TABS
1.0000 mg | ORAL_TABLET | Freq: Every day | ORAL | Status: DC | PRN
  Administered 2021-01-03 – 2021-01-08 (×4): 1 mg via ORAL
  Filled 2021-01-03 (×4): qty 2

## 2021-01-03 MED ORDER — 0.9 % SODIUM CHLORIDE (POUR BTL) OPTIME
TOPICAL | Status: DC | PRN
  Administered 2021-01-03: 1000 mL

## 2021-01-03 MED ORDER — TRANEXAMIC ACID-NACL 1000-0.7 MG/100ML-% IV SOLN
1000.0000 mg | Freq: Once | INTRAVENOUS | Status: AC
  Administered 2021-01-03: 1000 mg via INTRAVENOUS
  Filled 2021-01-03: qty 100

## 2021-01-03 MED ORDER — HYDROMORPHONE HCL 1 MG/ML IJ SOLN
INTRAMUSCULAR | Status: AC
  Filled 2021-01-03: qty 1

## 2021-01-03 MED ORDER — ONDANSETRON HCL 4 MG/2ML IJ SOLN: 4.0000 mg | Freq: Four times a day (QID) | INTRAMUSCULAR | Status: DC | PRN

## 2021-01-03 MED ORDER — LABETALOL HCL 5 MG/ML IV SOLN
10.0000 mg | INTRAVENOUS | Status: DC | PRN
Start: 2021-01-03 — End: 2021-01-17

## 2021-01-03 MED ORDER — MAGNESIUM SULFATE 2 GM/50ML IV SOLN
2.0000 g | Freq: Every day | INTRAVENOUS | Status: DC | PRN
  Filled 2021-01-03: qty 50

## 2021-01-03 MED ORDER — LIDOCAINE 2% (20 MG/ML) 5 ML SYRINGE
INTRAMUSCULAR | Status: DC | PRN
  Administered 2021-01-03: 100 mg via INTRAVENOUS

## 2021-01-03 MED ORDER — PROPOFOL 10 MG/ML IV BOLUS
INTRAVENOUS | Status: DC | PRN
  Administered 2021-01-03: 150 mg via INTRAVENOUS

## 2021-01-03 MED ORDER — HYDROMORPHONE HCL 1 MG/ML IJ SOLN
0.5000 mg | INTRAMUSCULAR | Status: DC | PRN
  Administered 2021-01-03 – 2021-01-15 (×48): 1 mg via INTRAVENOUS
  Filled 2021-01-03 (×50): qty 1

## 2021-01-03 MED ORDER — ACETAMINOPHEN 500 MG PO TABS
ORAL_TABLET | ORAL | Status: AC
  Administered 2021-01-03: 1000 mg via ORAL
  Filled 2021-01-03: qty 2

## 2021-01-03 MED ORDER — LACTATED RINGERS IV SOLN: INTRAVENOUS | Status: DC

## 2021-01-03 MED ORDER — DOLUTEGRAVIR-LAMIVUDINE 50-300 MG PO TABS: 1.0000 | ORAL_TABLET | Freq: Every day | ORAL | Status: DC

## 2021-01-03 MED ORDER — POTASSIUM CHLORIDE CRYS ER 20 MEQ PO TBCR: 20.0000 meq | EXTENDED_RELEASE_TABLET | Freq: Every day | ORAL | Status: DC | PRN

## 2021-01-03 MED ORDER — POLYETHYLENE GLYCOL 3350 17 G PO PACK: 17.0000 g | PACK | Freq: Every day | ORAL | Status: DC | PRN

## 2021-01-03 MED ORDER — DORAVIRINE 100 MG PO TABS
100.0000 mg | ORAL_TABLET | Freq: Every day | ORAL | Status: DC
  Administered 2021-01-04 – 2021-01-16 (×13): 100 mg via ORAL
  Filled 2021-01-03 (×13): qty 1

## 2021-01-03 MED ORDER — ESCITALOPRAM OXALATE 20 MG PO TABS
20.0000 mg | ORAL_TABLET | Freq: Every day | ORAL | Status: DC
  Administered 2021-01-04 – 2021-01-16 (×13): 20 mg via ORAL
  Filled 2021-01-03 (×13): qty 1

## 2021-01-03 MED ORDER — HYDRALAZINE HCL 20 MG/ML IJ SOLN
5.0000 mg | INTRAMUSCULAR | Status: DC | PRN
  Administered 2021-01-03: 5 mg via INTRAVENOUS
  Filled 2021-01-03: qty 1

## 2021-01-03 MED ORDER — FENTANYL CITRATE (PF) 100 MCG/2ML IJ SOLN
INTRAMUSCULAR | Status: AC
  Administered 2021-01-03: 50 ug via INTRAVENOUS
  Filled 2021-01-03: qty 2

## 2021-01-03 MED ORDER — NITROGLYCERIN 0.4 MG SL SUBL: 0.4000 mg | SUBLINGUAL_TABLET | SUBLINGUAL | Status: DC | PRN

## 2021-01-03 MED ORDER — CEFAZOLIN IN SODIUM CHLORIDE 3-0.9 GM/100ML-% IV SOLN
3.0000 g | INTRAVENOUS | Status: DC
  Filled 2021-01-03: qty 100

## 2021-01-03 MED ORDER — TRAZODONE HCL 100 MG PO TABS
100.0000 mg | ORAL_TABLET | Freq: Every day | ORAL | Status: DC
  Administered 2021-01-03 – 2021-01-15 (×12): 100 mg via ORAL
  Filled 2021-01-03 (×13): qty 1

## 2021-01-03 MED ORDER — FENTANYL CITRATE (PF) 250 MCG/5ML IJ SOLN
INTRAMUSCULAR | Status: DC | PRN
  Administered 2021-01-03 (×4): 50 ug via INTRAVENOUS

## 2021-01-03 MED ORDER — BISACODYL 5 MG PO TBEC
5.0000 mg | DELAYED_RELEASE_TABLET | Freq: Every day | ORAL | Status: DC | PRN
  Administered 2021-01-11: 5 mg via ORAL
  Filled 2021-01-03: qty 1

## 2021-01-03 MED ORDER — PHENOL 1.4 % MT LIQD: 1.0000 | OROMUCOSAL | Status: DC | PRN

## 2021-01-03 MED ORDER — OXYBUTYNIN CHLORIDE 5 MG PO TABS
2.5000 mg | ORAL_TABLET | Freq: Two times a day (BID) | ORAL | Status: DC
  Administered 2021-01-03 – 2021-01-16 (×25): 2.5 mg via ORAL
  Filled 2021-01-03 (×25): qty 1

## 2021-01-03 MED ORDER — ALUM & MAG HYDROXIDE-SIMETH 200-200-20 MG/5ML PO SUSP: 15.0000 mL | ORAL | Status: DC | PRN

## 2021-01-03 MED ORDER — ZINC SULFATE 220 (50 ZN) MG PO CAPS
220.0000 mg | ORAL_CAPSULE | Freq: Every day | ORAL | Status: DC
Start: 2021-01-04 — End: 2021-01-17
  Administered 2021-01-04 – 2021-01-16 (×13): 220 mg via ORAL
  Filled 2021-01-03 (×14): qty 1

## 2021-01-03 MED ORDER — ACETAMINOPHEN 500 MG PO TABS: 1000.0000 mg | ORAL_TABLET | Freq: Once | ORAL | Status: AC

## 2021-01-03 MED ORDER — OXYCODONE HCL 5 MG PO TABS
5.0000 mg | ORAL_TABLET | ORAL | Status: DC | PRN
  Administered 2021-01-03 – 2021-01-05 (×2): 10 mg via ORAL
  Filled 2021-01-03 (×2): qty 2
  Filled 2021-01-03: qty 1
  Filled 2021-01-03 (×2): qty 2

## 2021-01-03 MED ORDER — PHENYLEPHRINE 40 MCG/ML (10ML) SYRINGE FOR IV PUSH (FOR BLOOD PRESSURE SUPPORT)
PREFILLED_SYRINGE | INTRAVENOUS | Status: DC | PRN
  Administered 2021-01-03: 80 ug via INTRAVENOUS
  Administered 2021-01-03: 120 ug via INTRAVENOUS

## 2021-01-03 MED ORDER — KETAMINE HCL 10 MG/ML IJ SOLN
INTRAMUSCULAR | Status: DC | PRN
  Administered 2021-01-03: 50 mg via INTRAVENOUS

## 2021-01-03 MED ORDER — ALLOPURINOL 100 MG PO TABS
100.0000 mg | ORAL_TABLET | Freq: Every day | ORAL | Status: DC
  Administered 2021-01-04 – 2021-01-16 (×13): 100 mg via ORAL
  Filled 2021-01-03 (×13): qty 1

## 2021-01-03 MED ORDER — ASPIRIN EC 81 MG PO TBEC
81.0000 mg | DELAYED_RELEASE_TABLET | Freq: Every day | ORAL | Status: DC
  Administered 2021-01-04 – 2021-01-16 (×13): 81 mg via ORAL
  Filled 2021-01-03 (×13): qty 1

## 2021-01-03 MED ORDER — FENTANYL CITRATE (PF) 100 MCG/2ML IJ SOLN: 50.0000 ug | Freq: Once | INTRAMUSCULAR | Status: AC

## 2021-01-03 MED ORDER — LAMIVUDINE 150 MG PO TABS
300.0000 mg | ORAL_TABLET | Freq: Every day | ORAL | Status: DC
  Administered 2021-01-04 – 2021-01-16 (×13): 300 mg via ORAL
  Filled 2021-01-03 (×13): qty 2

## 2021-01-03 MED ORDER — ALFUZOSIN HCL ER 10 MG PO TB24
10.0000 mg | ORAL_TABLET | Freq: Every day | ORAL | Status: DC
  Administered 2021-01-06 – 2021-01-16 (×11): 10 mg via ORAL
  Filled 2021-01-03 (×15): qty 1

## 2021-01-03 MED ORDER — TRANEXAMIC ACID-NACL 1000-0.7 MG/100ML-% IV SOLN
1000.0000 mg | Freq: Once | INTRAVENOUS | Status: DC
Start: 2021-01-03 — End: 2021-01-03
  Filled 2021-01-03: qty 100

## 2021-01-03 MED ORDER — SODIUM CHLORIDE 0.9 % IV SOLN: INTRAVENOUS | Status: DC

## 2021-01-03 MED ORDER — DOLUTEGRAVIR SODIUM 50 MG PO TABS
50.0000 mg | ORAL_TABLET | Freq: Every day | ORAL | Status: DC
  Administered 2021-01-04 – 2021-01-16 (×13): 50 mg via ORAL
  Filled 2021-01-03 (×13): qty 1

## 2021-01-03 MED ORDER — FENTANYL CITRATE (PF) 250 MCG/5ML IJ SOLN
INTRAMUSCULAR | Status: AC
  Filled 2021-01-03: qty 5

## 2021-01-03 MED ORDER — GUAIFENESIN-DM 100-10 MG/5ML PO SYRP: 15.0000 mL | ORAL_SOLUTION | ORAL | Status: DC | PRN

## 2021-01-03 MED ORDER — ATORVASTATIN CALCIUM 80 MG PO TABS
80.0000 mg | ORAL_TABLET | Freq: Every day | ORAL | Status: DC
  Administered 2021-01-04 – 2021-01-16 (×13): 80 mg via ORAL
  Filled 2021-01-03 (×13): qty 1

## 2021-01-03 MED ORDER — GABAPENTIN 100 MG PO CAPS
100.0000 mg | ORAL_CAPSULE | Freq: Three times a day (TID) | ORAL | Status: DC
  Administered 2021-01-03 – 2021-01-16 (×37): 100 mg via ORAL
  Filled 2021-01-03 (×39): qty 1

## 2021-01-03 MED ORDER — DEXTROSE 5 % IV SOLN
INTRAVENOUS | Status: DC | PRN
  Administered 2021-01-03: 3 g via INTRAVENOUS

## 2021-01-03 MED ORDER — JUVEN PO PACK
1.0000 | PACK | Freq: Two times a day (BID) | ORAL | Status: DC
  Administered 2021-01-03 – 2021-01-05 (×4): 1 via ORAL
  Filled 2021-01-03 (×4): qty 1

## 2021-01-03 MED ORDER — CHLORHEXIDINE GLUCONATE CLOTH 2 % EX PADS
6.0000 | MEDICATED_PAD | Freq: Every day | CUTANEOUS | Status: DC
  Administered 2021-01-04 – 2021-01-15 (×12): 6 via TOPICAL

## 2021-01-03 MED ORDER — MAGNESIUM CITRATE PO SOLN
1.0000 | Freq: Once | ORAL | Status: DC | PRN
  Filled 2021-01-03: qty 296

## 2021-01-03 MED ORDER — ORAL CARE MOUTH RINSE: 15.0000 mL | Freq: Once | OROMUCOSAL | Status: AC

## 2021-01-03 MED ORDER — ONDANSETRON HCL 4 MG/2ML IJ SOLN
INTRAMUSCULAR | Status: DC | PRN
  Administered 2021-01-03: 4 mg via INTRAVENOUS

## 2021-01-03 MED ORDER — OXYCODONE HCL 5 MG PO TABS
10.0000 mg | ORAL_TABLET | ORAL | Status: DC | PRN
  Administered 2021-01-03 – 2021-01-04 (×3): 15 mg via ORAL
  Administered 2021-01-05: 10 mg via ORAL
  Administered 2021-01-05 – 2021-01-16 (×15): 15 mg via ORAL
  Filled 2021-01-03 (×19): qty 3

## 2021-01-03 MED ORDER — CEFAZOLIN SODIUM-DEXTROSE 2-4 GM/100ML-% IV SOLN
2.0000 g | Freq: Three times a day (TID) | INTRAVENOUS | Status: AC
  Administered 2021-01-03 – 2021-01-04 (×2): 2 g via INTRAVENOUS
  Filled 2021-01-03 (×2): qty 100

## 2021-01-03 MED ORDER — ASCORBIC ACID 500 MG PO TABS
1000.0000 mg | ORAL_TABLET | Freq: Every day | ORAL | Status: DC
  Administered 2021-01-04 – 2021-01-16 (×13): 1000 mg via ORAL
  Filled 2021-01-03 (×13): qty 2

## 2021-01-03 MED ORDER — ACETAMINOPHEN 325 MG PO TABS
325.0000 mg | ORAL_TABLET | Freq: Four times a day (QID) | ORAL | Status: DC | PRN
  Administered 2021-01-03 – 2021-01-15 (×3): 650 mg via ORAL
  Filled 2021-01-03 (×4): qty 2

## 2021-01-03 MED ORDER — INSULIN ASPART 100 UNIT/ML IJ SOLN
0.0000 [IU] | Freq: Three times a day (TID) | INTRAMUSCULAR | Status: DC
  Administered 2021-01-03 – 2021-01-09 (×9): 2 [IU] via SUBCUTANEOUS
  Administered 2021-01-09 – 2021-01-10 (×2): 3 [IU] via SUBCUTANEOUS
  Administered 2021-01-11: 2 [IU] via SUBCUTANEOUS
  Administered 2021-01-11: 3 [IU] via SUBCUTANEOUS
  Administered 2021-01-11 – 2021-01-15 (×3): 2 [IU] via SUBCUTANEOUS

## 2021-01-03 MED ORDER — SODIUM CHLORIDE 0.9% IV SOLUTION: Freq: Once | INTRAVENOUS | Status: AC

## 2021-01-03 MED ORDER — KETAMINE HCL 50 MG/5ML IJ SOSY
PREFILLED_SYRINGE | INTRAMUSCULAR | Status: AC
  Filled 2021-01-03: qty 5

## 2021-01-03 MED ORDER — HYDROMORPHONE HCL 1 MG/ML IJ SOLN
0.2500 mg | INTRAMUSCULAR | Status: DC | PRN
  Administered 2021-01-03 (×4): 0.5 mg via INTRAVENOUS

## 2021-01-03 MED ORDER — PANTOPRAZOLE SODIUM 40 MG PO TBEC
40.0000 mg | DELAYED_RELEASE_TABLET | Freq: Every day | ORAL | Status: DC
  Administered 2021-01-04 – 2021-01-16 (×13): 40 mg via ORAL
  Filled 2021-01-03 (×13): qty 1

## 2021-01-03 MED ORDER — ICOSAPENT ETHYL 1 G PO CAPS
2.0000 g | ORAL_CAPSULE | Freq: Two times a day (BID) | ORAL | Status: DC
  Administered 2021-01-03 – 2021-01-16 (×23): 2 g via ORAL
  Filled 2021-01-03 (×27): qty 2

## 2021-01-03 MED ORDER — CARVEDILOL 3.125 MG PO TABS
3.1250 mg | ORAL_TABLET | Freq: Two times a day (BID) | ORAL | Status: DC
  Administered 2021-01-03 – 2021-01-16 (×25): 3.125 mg via ORAL
  Filled 2021-01-03 (×26): qty 1

## 2021-01-03 SURGICAL SUPPLY — 39 items
BAG COUNTER SPONGE SURGICOUNT (BAG) IMPLANT
BAG SPNG CNTER NS LX DISP (BAG)
BLADE SAW RECIP 87.9 MT (BLADE) ×2 IMPLANT
BLADE SURG 21 STRL SS (BLADE) ×2 IMPLANT
BNDG COHESIVE 6X5 TAN STRL LF (GAUZE/BANDAGES/DRESSINGS) ×2 IMPLANT
CANISTER WOUND CARE 500ML ATS (WOUND CARE) IMPLANT
COVER SURGICAL LIGHT HANDLE (MISCELLANEOUS) ×2 IMPLANT
CUFF TOURN SGL QUICK 34 (TOURNIQUET CUFF)
CUFF TRNQT CYL 34X4.125X (TOURNIQUET CUFF) IMPLANT
DRAPE INCISE IOBAN 66X45 STRL (DRAPES) ×4 IMPLANT
DRAPE U-SHAPE 47X51 STRL (DRAPES) ×2 IMPLANT
DRESSING PREVENA PLUS CUSTOM (GAUZE/BANDAGES/DRESSINGS) ×1 IMPLANT
DRSG PREVENA PLUS CUSTOM (GAUZE/BANDAGES/DRESSINGS) ×2
DURAPREP 26ML APPLICATOR (WOUND CARE) ×2 IMPLANT
ELECT REM PT RETURN 9FT ADLT (ELECTROSURGICAL) ×2
ELECTRODE REM PT RTRN 9FT ADLT (ELECTROSURGICAL) ×1 IMPLANT
GLOVE SURG ORTHO LTX SZ9 (GLOVE) ×2 IMPLANT
GLOVE SURG POLYISO LF SZ6.5 (GLOVE) ×2 IMPLANT
GLOVE SURG UNDER POLY LF SZ7.5 (GLOVE) ×2 IMPLANT
GLOVE SURG UNDER POLY LF SZ9 (GLOVE) ×2 IMPLANT
GOWN STRL REUS W/ TWL LRG LVL3 (GOWN DISPOSABLE) ×1 IMPLANT
GOWN STRL REUS W/ TWL XL LVL3 (GOWN DISPOSABLE) ×2 IMPLANT
GOWN STRL REUS W/TWL LRG LVL3 (GOWN DISPOSABLE) ×2
GOWN STRL REUS W/TWL XL LVL3 (GOWN DISPOSABLE) ×4
KIT BASIN OR (CUSTOM PROCEDURE TRAY) ×2 IMPLANT
KIT TURNOVER KIT B (KITS) ×2 IMPLANT
MANIFOLD NEPTUNE II (INSTRUMENTS) ×2 IMPLANT
NS IRRIG 1000ML POUR BTL (IV SOLUTION) ×2 IMPLANT
PACK ORTHO EXTREMITY (CUSTOM PROCEDURE TRAY) ×2 IMPLANT
PAD ARMBOARD 7.5X6 YLW CONV (MISCELLANEOUS) ×2 IMPLANT
PREVENA RESTOR ARTHOFORM 46X30 (CANNISTER) ×2 IMPLANT
STAPLER VISISTAT 35W (STAPLE) IMPLANT
STOCKINETTE IMPERVIOUS LG (DRAPES) IMPLANT
SUT ETHILON 2 0 PSLX (SUTURE) ×4 IMPLANT
SUT SILK 2 0 (SUTURE) ×2
SUT SILK 2-0 18XBRD TIE 12 (SUTURE) ×1 IMPLANT
TOWEL GREEN STERILE FF (TOWEL DISPOSABLE) ×2 IMPLANT
TUBE CONNECTING 20X1/4 (TUBING) ×2 IMPLANT
YANKAUER SUCT BULB TIP NO VENT (SUCTIONS) ×2 IMPLANT

## 2021-01-03 NOTE — Anesthesia Procedure Notes (Signed)
Procedure Name: LMA Insertion Date/Time: 01/03/2021 11:12 AM Performed by: Annamary Carolin, CRNA Pre-anesthesia Checklist: Patient identified, Emergency Drugs available, Suction available and Patient being monitored Patient Re-evaluated:Patient Re-evaluated prior to induction Oxygen Delivery Method: Circle System Utilized Preoxygenation: Pre-oxygenation with 100% oxygen Induction Type: IV induction Ventilation: Mask ventilation without difficulty LMA: LMA inserted LMA Size: 4.0 Number of attempts: 1 Airway Equipment and Method: Bite block Placement Confirmation: positive ETCO2 Tube secured with: Tape Dental Injury: Teeth and Oropharynx as per pre-operative assessment  Comments: With ease

## 2021-01-03 NOTE — Anesthesia Preprocedure Evaluation (Addendum)
Anesthesia Evaluation  Patient identified by MRN, date of birth, ID band Patient awake    Reviewed: Allergy & Precautions, NPO status , Patient's Chart, lab work & pertinent test results, reviewed documented beta blocker date and time   Airway Mallampati: III  TM Distance: >3 FB Neck ROM: Full    Dental  (+) Dental Advisory Given, Edentulous Lower, Edentulous Upper   Pulmonary neg pulmonary ROS,    Pulmonary exam normal breath sounds clear to auscultation       Cardiovascular hypertension, Pt. on home beta blockers and Pt. on medications + CAD, + Past MI, + Cardiac Stents (2020, 2021) and + Peripheral Vascular Disease  Normal cardiovascular exam Rhythm:Regular Rate:Normal  TTE 2021 1. Technically difficult study with poor acoustical windows and patient  cooperation even with definity contrast. Left ventricular ejection  fraction, by estimation, is 55 to 60%. The left ventricle has normal  function. Left ventricular endocardial border  not optimally defined to evaluate regional wall motion but grossly appear  normal. There is mild concentric left ventricular hypertrophy. Left  ventricular diastolic parameters are consistent with Grade I diastolic  dysfunction (impaired relaxation).  2. Right ventricular systolic function is normal. The right ventricular  size is normal. Tricuspid regurgitation signal is inadequate for assessing  PA pressure.  3. The mitral valve is normal in structure. No evidence of mitral valve  regurgitation. No evidence of mitral stenosis.  4. The aortic valve is normal in structure. Aortic valve regurgitation is  not visualized. No aortic stenosis is present.  5. Aortic dilatation noted. There is mild dilatation of the ascending  aorta and of the aortic root, measuring 38 mm.  6. The inferior vena cava is normal in size with greater than 50%  respiratory variability, suggesting right atrial pressure of  3 mmHg.   Lookingglass 2021 Mid Cx lesion is 60% stenosed. 1st Mrg-1 lesion is 95% stenosed. Previously placed 1st Mrg-2 stent (unknown type) is widely patent. Dist LAD-1 lesion is 80% stenosed. Dist LAD-2 lesion is 90% stenosed. Dist LAD-3 lesion is 90% stenosed. Dist RCA lesion is 50% stenosed. The left ventricular systolic function is normal. LV end diastolic pressure is normal. The left ventricular ejection fraction is 50-55% by visual estimate    Neuro/Psych CVA negative psych ROS   GI/Hepatic Neg liver ROS, GERD  ,  Endo/Other  negative endocrine ROSdiabetes  Renal/GU Renal InsufficiencyRenal disease  negative genitourinary   Musculoskeletal negative musculoskeletal ROS (+)   Abdominal   Peds  Hematology  (+) HIV,   Anesthesia Other Findings   Reproductive/Obstetrics                            Anesthesia Physical Anesthesia Plan  ASA: 3  Anesthesia Plan: General   Post-op Pain Management:    Induction: Intravenous  PONV Risk Score and Plan: 2 and Ondansetron, Dexamethasone and Midazolam  Airway Management Planned: LMA  Additional Equipment:   Intra-op Plan:   Post-operative Plan: Extubation in OR  Informed Consent: I have reviewed the patients History and Physical, chart, labs and discussed the procedure including the risks, benefits and alternatives for the proposed anesthesia with the patient or authorized representative who has indicated his/her understanding and acceptance.     Dental advisory given  Plan Discussed with: CRNA  Anesthesia Plan Comments:         Anesthesia Quick Evaluation

## 2021-01-03 NOTE — Progress Notes (Signed)
Orthopedic Tech Progress Note Patient Details:  Keith [name] 1965-10-26 [name]  Per MD "STUMP SHRINKER was applied in OR"   [name], male   DOB: 1965/09/04, 55 y.o.   MRN: [name]  [name] 01/03/2021, 4:48 PM

## 2021-01-03 NOTE — Transfer of Care (Signed)
Immediate Anesthesia Transfer of Care Note  Patient: Keith [name]  Procedure(s) Performed: LEFT ABOVE KNEE AMPUTATION (Left: Knee)  Patient Location: PACU  Anesthesia Type:General  Level of Consciousness: drowsy  Airway & Oxygen Therapy: Patient Spontanous Breathing and Patient connected to face mask oxygen  Post-op Assessment: Report given to RN and Post -op Vital signs reviewed and stable  Post vital signs: Reviewed and stable  Last Vitals:  Vitals Value Taken Time  BP    Temp    Pulse 67 01/03/21 1158  Resp 18 01/03/21 1158  SpO2 100 % 01/03/21 1158  Vitals shown include unvalidated device data.  Last Pain:  Vitals:   01/03/21 0853  TempSrc:   PainSc: 10-Worst pain ever      Patients Stated Pain Goal: 0 ([id number])  Complications: No notable events documented.

## 2021-01-03 NOTE — Interval H&P Note (Signed)
History and Physical Interval Note:  01/03/2021 9:34 AM  Keith [name]  has presented today for surgery, with the diagnosis of Dehiscence Left Below Knee Amputation.  The various methods of treatment have been discussed with the patient and family. After consideration of risks, benefits and other options for treatment, the patient has consented to  Procedure(s): LEFT ABOVE KNEE AMPUTATION (Left) as a surgical intervention.  The patient's history has been reviewed, patient examined, no change in status, stable for surgery.  I have reviewed the patient's chart and labs.  Questions were answered to the patient's satisfaction.     [name]

## 2021-01-03 NOTE — Op Note (Signed)
01/03/2021  12:00 PM  PATIENT:  Keith [name]    PRE-OPERATIVE DIAGNOSIS:  Dehiscence Left Below Knee Amputation  POST-OPERATIVE DIAGNOSIS:  Same  PROCEDURE:  LEFT ABOVE KNEE AMPUTATION  SURGEON:  [name], MD  PHYSICIAN ASSISTANT:None ANESTHESIA:   General  PREOPERATIVE INDICATIONS:  Keith [name] is a  55 y.o. male with a diagnosis of Dehiscence Left Below Knee Amputation who failed conservative measures and elected for surgical management.    The risks benefits and alternatives were discussed with the patient preoperatively including but not limited to the risks of infection, bleeding, nerve injury, cardiopulmonary complications, the need for revision surgery, among others, and the patient was willing to proceed.  OPERATIVE IMPLANTS: Praveena customizable and Arthur form wound VAC  '@ENCIMAGES'$ @  OPERATIVE FINDINGS: Patient had calcification of the femoral vessels good petechial bleeding within the muscle good contractility good color and consistency.  OPERATIVE PROCEDURE: Patient was brought the operating room and underwent a general anesthetic.  After adequate levels anesthesia were obtained patient's left lower extremity was prepped using DuraPrep draped into a sterile field the wound dehiscence of the transtibial amputation was draped out of the sterile field with impervious stockinette.  A fishmouth incision was made just proximal to the patella tendon.  This was carried sharply down to bone.  Clamp was placed on the medial vascular bundle and this was suture-ligated with 2-0 silk.  The amputation of the skin and soft tissue was completed a reciprocating saw was used to amputate through the femur.  Electrocardio was used hemostasis the wound was further cleansed.  The deep and superficial fascial layers were closed using #1 Vicryl skin was closed using 2-0 nylon and staples.  A Praveena customizable and Arnell Sieving form wound VAC was applied this had a good suction fit patient was  extubated taken the PACU in stable condition.  A stump shrinker was applied.   DISCHARGE PLANNING:  Antibiotic duration: 24 hours  Weightbearing: Nonweightbearing on the left  Pain medication: Opioid pathway  Dressing care/ Wound VAC: Continue wound VAC for 1 week  Ambulatory devices: Walker  Discharge to: Evaluate for discharge to skilled nursing.  Follow-up: In the office 1 week post operative.

## 2021-01-03 NOTE — Anesthesia Postprocedure Evaluation (Signed)
Anesthesia Post Note  Patient: Keith [name]  Procedure(s) Performed: LEFT ABOVE KNEE AMPUTATION (Left: Knee)     Patient location during evaluation: PACU Anesthesia Type: General Level of consciousness: awake and alert Pain management: pain level controlled Vital Signs Assessment: post-procedure vital signs reviewed and stable Respiratory status: spontaneous breathing, nonlabored ventilation, respiratory function stable and patient connected to nasal cannula oxygen Cardiovascular status: blood pressure returned to baseline and stable Postop Assessment: no apparent nausea or vomiting Anesthetic complications: no   No notable events documented.  Last Vitals:  Vitals:   01/03/21 1310 01/03/21 1330  BP: 114/83 120/83  Pulse: 78 79  Resp: 12 11  Temp: 36.9 C 36.6 C  SpO2: 99% 100%    Last Pain:  Vitals:   01/03/21 1330  TempSrc: Oral  PainSc:                  [name] L Ambriella Kitt

## 2021-01-04 ENCOUNTER — Encounter (HOSPITAL_COMMUNITY): Payer: Self-pay | Admitting: Orthopedic Surgery

## 2021-01-04 LAB — CBC
HCT: 25.9 % — ABNORMAL LOW (ref 39.0–52.0)
Hemoglobin: 8.2 g/dL — ABNORMAL LOW (ref 13.0–17.0)
MCH: 28.7 pg (ref 26.0–34.0)
MCHC: 31.7 g/dL (ref 30.0–36.0)
MCV: 90.6 fL (ref 80.0–100.0)
Platelets: 321 10*3/uL (ref 150–400)
RBC: 2.86 MIL/uL — ABNORMAL LOW (ref 4.22–5.81)
RDW: 15.5 % (ref 11.5–15.5)
WBC: 13.4 10*3/uL — ABNORMAL HIGH (ref 4.0–10.5)
nRBC: 0 % (ref 0.0–0.2)

## 2021-01-04 LAB — BASIC METABOLIC PANEL
Anion gap: 9 (ref 5–15)
BUN: 35 mg/dL — ABNORMAL HIGH (ref 6–20)
CO2: 19 mmol/L — ABNORMAL LOW (ref 22–32)
Calcium: 8.8 mg/dL — ABNORMAL LOW (ref 8.9–10.3)
Chloride: 108 mmol/L (ref 98–111)
Creatinine, Ser: 1.99 mg/dL — ABNORMAL HIGH (ref 0.61–1.24)
GFR, Estimated: 39 mL/min — ABNORMAL LOW (ref >60)
Glucose, Bld: 138 mg/dL — ABNORMAL HIGH (ref 70–99)
Potassium: 4.6 mmol/L (ref 3.5–5.1)
Sodium: 136 mmol/L (ref 135–145)

## 2021-01-04 LAB — GLUCOSE, CAPILLARY
Glucose-Capillary: 146 mg/dL — ABNORMAL HIGH (ref 70–99)
Glucose-Capillary: 113 mg/dL — ABNORMAL HIGH (ref 70–99)
Glucose-Capillary: 118 mg/dL — ABNORMAL HIGH (ref 70–99)

## 2021-01-04 NOTE — Plan of Care (Signed)
  Problem: Health Behavior/Discharge Planning: Goal: Ability to manage health-related needs will improve Outcome: Progressing   

## 2021-01-04 NOTE — Progress Notes (Signed)
PT Cancellation Note  Patient Details Name: Keith [name] MRN: [name] DOB: December 20, 1965   Cancelled Treatment:    Reason Eval/Treat Not Completed: Pain limiting ability to participate. Pt reports he can not do therapy today due to pain. Will continue to follow   [place] 01/04/2021, 12:16 PM [place] Pager [phone] Office [phone]

## 2021-01-04 NOTE — Progress Notes (Signed)
OT Cancellation Note  Patient Details Name: Keith [name] MRN: [name] DOB: 1965/12/01   Cancelled Treatment:    Reason Eval/Treat Not Completed: Pain limiting ability to participate. OT to check back 9/30.   Gloris Manchester OTR/L Supplemental OT, Department of rehab services [phone]  Keith Witherspoon R H. 01/04/2021, 12:12 PM

## 2021-01-04 NOTE — Plan of Care (Signed)

## 2021-01-04 NOTE — Progress Notes (Signed)
Inpatient Rehab Admissions Coordinator:   Consult received.  Note that [facility] will not approve CIR for a patient with an amputation.  We will sign off at this time.  Recommend therapy and TOC pursue alternative venues of care.   Shann Medal, PT, DPT Admissions Coordinator [phone] 01/04/21  9:28 AM

## 2021-01-04 NOTE — Progress Notes (Signed)
[name], male   DOB: [birth year], 55 y.o.   MRN: [name] Patient is postoperative day 1 left above-the-knee amputation.  There is no drainage in the wound VAC canister.  Patient states he does have some pain anteriorly.  Plan for discharge pending therapy recommendations.

## 2021-01-05 LAB — BASIC METABOLIC PANEL
Anion gap: 7 (ref 5–15)
BUN: 33 mg/dL — ABNORMAL HIGH (ref 6–20)
CO2: 21 mmol/L — ABNORMAL LOW (ref 22–32)
Calcium: 8.7 mg/dL — ABNORMAL LOW (ref 8.9–10.3)
Chloride: 108 mmol/L (ref 98–111)
Creatinine, Ser: 2.18 mg/dL — ABNORMAL HIGH (ref 0.61–1.24)
GFR, Estimated: 35 mL/min — ABNORMAL LOW (ref >60)
Glucose, Bld: 99 mg/dL (ref 70–99)
Potassium: 4.3 mmol/L (ref 3.5–5.1)
Sodium: 136 mmol/L (ref 135–145)

## 2021-01-05 LAB — GLUCOSE, CAPILLARY
Glucose-Capillary: 101 mg/dL — ABNORMAL HIGH (ref 70–99)
Glucose-Capillary: 115 mg/dL — ABNORMAL HIGH (ref 70–99)
Glucose-Capillary: 129 mg/dL — ABNORMAL HIGH (ref 70–99)

## 2021-01-05 LAB — CBC
HCT: 24.6 % — ABNORMAL LOW (ref 39.0–52.0)
Hemoglobin: 7.5 g/dL — ABNORMAL LOW (ref 13.0–17.0)
MCH: 28.5 pg (ref 26.0–34.0)
MCHC: 30.5 g/dL (ref 30.0–36.0)
MCV: 93.5 fL (ref 80.0–100.0)
Platelets: 310 10*3/uL (ref 150–400)
RBC: 2.63 MIL/uL — ABNORMAL LOW (ref 4.22–5.81)
RDW: 15.6 % — ABNORMAL HIGH (ref 11.5–15.5)
WBC: 11.4 10*3/uL — ABNORMAL HIGH (ref 4.0–10.5)
nRBC: 0 % (ref 0.0–0.2)

## 2021-01-05 LAB — HEMOGLOBIN AND HEMATOCRIT, BLOOD
HCT: 26 % — ABNORMAL LOW (ref 39.0–52.0)
Hemoglobin: 8.1 g/dL — ABNORMAL LOW (ref 13.0–17.0)

## 2021-01-05 LAB — PREPARE RBC (CROSSMATCH)

## 2021-01-05 MED ORDER — DAPSONE 100 MG PO TABS
100.0000 mg | ORAL_TABLET | Freq: Every day | ORAL | Status: DC
  Administered 2021-01-06 – 2021-01-16 (×11): 100 mg via ORAL
  Filled 2021-01-05 (×12): qty 1

## 2021-01-05 MED ORDER — SODIUM CHLORIDE 0.9% IV SOLUTION: Freq: Once | INTRAVENOUS | Status: AC

## 2021-01-05 MED ORDER — ADULT MULTIVITAMIN W/MINERALS CH
1.0000 | ORAL_TABLET | Freq: Every day | ORAL | Status: DC
  Administered 2021-01-06 – 2021-01-16 (×10): 1 via ORAL
  Filled 2021-01-05 (×12): qty 1

## 2021-01-05 NOTE — Social Work (Signed)
CSW acknowledges SNF consult. Last admission pt was here for 71 days. SNF acceptance is unlikely due to insurance and past behaviors.   Emeterio Reeve, LCSW Clinical Social Worker

## 2021-01-05 NOTE — Care Management Important Message (Signed)
Important Message  Patient Details  Name: AQEEL RELYEA MRN: [id number] Date of Birth: 1966/03/29   Medicare Important Message Given:  Yes     Victorine Mcnee Montine Circle 01/05/2021, 3:56 PM

## 2021-01-05 NOTE — Evaluation (Signed)
Occupational Therapy Evaluation Patient Details Name: Keith [name] MRN: [name] DOB: [birth year] Today's Date: 01/05/2021   History of Present Illness Pt adm 9/28 for lt AKA due to dehiscence of lt BKA. PMH - rt BKA, lt BKA, AIDS, CVA, CKD, HTN, DM, CAD   Clinical Impression   Pt on second attempt agreed to complete session. Pt took increase time to attempt in bed mobility and was only able to go from supine to sitting about half way with HOB elevated then decline any further. Pt decline any further transfers.  Pt currently with functional limitations due to the deficits listed below (see OT Problem List).  Pt will benefit from skilled OT to increase their safety and independence with ADL and functional mobility for ADL to facilitate discharge to venue listed below.        Recommendations for follow up therapy are one component of a multi-disciplinary discharge planning process, led by the attending physician.  Recommendations may be updated based on patient status, additional functional criteria and insurance authorization.   Follow Up Recommendations  SNF;Supervision/Assistance - 24 hour    Equipment Recommendations       Recommendations for Other Services       Precautions / Restrictions Precautions Precautions: Fall Precaution Comments: B BKA's: R healed,  L BKA rev 8/27, RLE prosthesis Restrictions Weight Bearing Restrictions: Yes LUE Weight Bearing: Non weight bearing RLE Weight Bearing: Non weight bearing LLE Weight Bearing: Non weight bearing Other Position/Activity Restrictions: has prosthetic for R LE      Mobility Bed Mobility Overal bed mobility: Needs Assistance Bed Mobility: Supine to Sit     Supine to sit: Min assist (pt did not want therapist asist to transfer and with HOB elevated made it about half way into sitting then layed back down and reproted that was all they could complete)          Transfers Overall transfer level:  (pt  decline) Equipment used: None             General transfer comment: pt reported they were completing a squat pivot before when needed more assistance with walker to wheelchair and declines to even attempt to use slidding board transfer    Balance                                           ADL either performed or assessed with clinical judgement   ADL Overall ADL's : Needs assistance/impaired Eating/Feeding: Set up;Bed level;Cueing for safety;Cueing for sequencing   Grooming: Wash/dry hands;Wash/dry face;Bed level;Set up   Upper Body Bathing: Minimal assistance;Bed level;Cueing for sequencing;Cueing for safety   Lower Body Bathing: Maximal assistance;+2 for physical assistance;+2 for safety/equipment;Cueing for safety;Cueing for sequencing;Bed level   Upper Body Dressing : Minimal assistance;Cueing for safety;Cueing for sequencing;Bed level   Lower Body Dressing: Maximal assistance;+2 for physical assistance;+2 for safety/equipment;Cueing for safety;Cueing for sequencing;Bed level                 General ADL Comments: pt agreed with max cues ot motivate pt complete attempt to sit at EOB and mad it about half way then decline to fully sit up with [facility] elevated     Vision Baseline Vision/History: 1 Wears glasses Wears Glasses: Reading only       Perception     Praxis      Pertinent Vitals/Pain Pain Assessment: 0-10  Pain Score: 10-Worst pain ever Pain Location: LLE Pain Descriptors / Indicators: Guarding;Discomfort Pain Intervention(s): Limited activity within patient's tolerance;Premedicated before session     Hand Dominance Right   Extremity/Trunk Assessment Upper Extremity Assessment Upper Extremity Assessment: LUE deficits/detail LUE Deficits / Details: pt has full ROM but due to dependenece on BUE for suppport reports pain with some movement in UE   Lower Extremity Assessment Lower Extremity Assessment: Defer to PT evaluation;LLE  deficits/detail;RLE deficits/detail RLE Deficits / Details: bka, requested to bring in prosthesis LLE Deficits / Details: s/p BKA   Cervical / Trunk Assessment Cervical / Trunk Assessment: Normal   Communication Communication Communication: No difficulties   Cognition Arousal/Alertness: Awake/alert Behavior During Therapy: WFL for tasks assessed/performed;Impulsive Overall Cognitive Status: No family/caregiver present to determine baseline cognitive functioning Area of Impairment: Safety/judgement                                   General Comments       Exercises     Shoulder Instructions      Home Living Family/patient expects to be discharged to:: Private residence Living Arrangements: Parent Available Help at Discharge: Family;Available PRN/intermittently Type of Home: House Home Access: Stairs to enter [employer] of Steps: 3 Entrance Stairs-Rails: Right;Left Home Layout: One level     Bathroom Shower/Tub: Corporate investment banker: Standard Bathroom Accessibility: Yes How Accessible: Accessible via walker Home Equipment: [place] - 2 wheels;Shower seat;Wheelchair - manual          Prior Functioning/Environment Level of Independence: Independent with assistive device(s)        Comments: uses prosthetic and mobilizes with walker.        OT Problem List: Decreased strength;Decreased activity tolerance;Impaired balance (sitting and/or standing);Decreased safety awareness;Decreased knowledge of use of DME or AE;Decreased knowledge of precautions;Obesity;Pain      OT Treatment/Interventions: Self-care/ADL training;Therapeutic exercise;DME and/or AE instruction;Therapeutic activities;Patient/family education;Balance training    OT Goals(Current goals can be found in the care plan section) Acute Rehab OT Goals Patient Stated Goal: have less pain OT Goal Formulation: With patient Time For Goal Achievement:  01/06/21 Potential to Achieve Goals: Fair  OT Frequency: Min 2X/week   Barriers to D/C:    pt lives family who mother is 64 yrs old       Co-evaluation              AM-PAC OT "6 Clicks" Daily Activity     Outcome Measure Help from another person eating meals?: A Little Help from another person taking care of personal grooming?: A Little Help from another person toileting, which includes using toliet, bedpan, or urinal?: A Lot Help from another person bathing (including washing, rinsing, drying)?: A Lot Help from another person to put on and taking off regular upper body clothing?: A Little Help from another person to put on and taking off regular lower body clothing?: A Lot 6 Click Score: 15   End of Session Nurse Communication: Mobility status  Activity Tolerance: Patient limited by pain Patient left: in bed;with call bell/phone within reach;with bed alarm set  OT Visit Diagnosis: Other abnormalities of gait and mobility (R26.89);Muscle weakness (generalized) (M62.81);Pain;Unsteadiness on feet (R26.81) Pain - Right/Left: Left Pain - part of body: Leg                Time: [id number] OT Time Calculation (min): 49 min Charges:  OT General Charges $OT Visit:  1 Visit OT Evaluation $OT Eval Low Complexity: 1 Low OT Treatments $Self Care/Home Management : 23-37 mins  Joeseph Amor OTR/L  Acute Rehab Services  [phone] office number [phone] pager number   Joeseph Amor 01/05/2021, 10:38 AM

## 2021-01-05 NOTE — Progress Notes (Signed)
Patient is POD2 s/p left transtibial amputaion  He complains of pain asking for higher doses of dilauded   Vss afebrile HGB 7.5 0 cc in cannister wound vac functioning but has 2 black x  Plan discussed with patient to ask for his oral pain medication sooner as he states it works but takes too long to kick in, W  Plan for discharge to SNF . Will transfuse 1 unit

## 2021-01-05 NOTE — Progress Notes (Signed)
Initial Nutrition Assessment  DOCUMENTATION CODES:   Obesity unspecified  INTERVENTION:   -D/c Juven -MVI with minerals daily -Magic cup TID with meals, each supplement provides 290 kcal and 9 grams of protein   NUTRITION DIAGNOSIS:   Increased nutrient needs related to post-op healing as evidenced by estimated needs.  GOAL:   Patient will meet greater than or equal to 90% of their needs  MONITOR:   PO intake, Supplement acceptance, Labs, Weight trends, Skin, I & O's  REASON FOR ASSESSMENT:   Other (Comment)    ASSESSMENT:   Patient is a 55 year old gentleman who is status post a right transtibial amputation and uses a prosthesis on the right.  He is most recently status post revision of a left transtibial amputation.  He is 4 weeks out from his index surgery.  Patient states that he messed up and has been having wound healing problems.  Patient is at home.  Pt admitted with lt below knee amputation wound dehiscence.   9/28- s/p lt AKA with wound vac placement  Reviewed I/O's: -545 ml x 24 hours and +505 ml since admission  UOP: 1.4 L x 24 hours  Pt sleeping soundly at time of visit. RN handing blood at time of visit.   Observed breakfast tray; pt consumed 75% of meal. Per meal completions records, PO: 75%.    Pt familiar with pt from prior hospitalization. He does not accept supplements well, especially Prosource.   RD will d/c Juven due to national shortage.   Medications reviewed and include vitamin C, colace, tivicay, and zinc sulfate.   Lab Results  Component Value Date   HGBA1C 5.9 (H) 11/29/2020   PTA DM medications are .   Labs reviewed: CBGS: 101-118 (inpatient orders for glycemic control are 0-15 units inuslin aspart TID with meals).    Diet Order:   Diet Order             Diet Carb Modified Fluid consistency: Thin; Room service appropriate? Yes  Diet effective now                   EDUCATION NEEDS:   No education needs have been  identified at this time  Skin:  Skin Assessment: Skin Integrity Issues: Skin Integrity Issues:: Other (Comment) Wound Vac: s/p lt AKA Incisions: - Other: MASD to scrotum  Last BM:  01/04/21  Height:   Ht Readings from Last 1 Encounters:  01/03/21 '5\' 9"'$  (1.753 m)    Weight:   Wt Readings from Last 1 Encounters:  01/03/21 120.2 kg    Ideal Body Weight:  62.2 kg (adjusted for lt AKA and rt BKA)  BMI:  Body mass index is 39.13 kg/m.  Estimated Nutritional Needs:   Kcal:  2000-2200  Protein:  110-125 grams  Fluid:  > 2 L    Loistine Chance, RD, LDN, [place] Registered Dietitian II Certified Diabetes Care and Education Specialist Please refer to [facility] for RD and/or RD on-call/weekend/after hours pager

## 2021-01-05 NOTE — Evaluation (Signed)
Physical Therapy Evaluation Patient Details Name: Keith [name] MRN: [name] DOB: 1966-03-26 Today's Date: 01/05/2021  History of Present Illness  Pt adm 9/28 for lt AKA on 01/03/21 due to dehiscence of lt BKA from 10/27/20. PMH - rt BKA, lt BKA, AIDS, CVA, CKD, HTN, DM, CAD  Clinical Impression  Pt admitted with above diagnosis. At baseline, pt ambulated with R prosthesis, however, since L BKA in July has only been squat pivoting or lateral scooting with assist.  Today, pt was limited by pain and only able to partially sit EOB.  Declined anterior posterior scoot.  Performed minimal motion of R hip due to pain.  Pt will likely need SNF level of care at d/c - noted he was difficult to place last admission.   Pt currently with functional limitations due to the deficits listed below (see PT Problem List). Pt will benefit from skilled PT to increase their independence and safety with mobility to allow discharge to the venue listed below.          Recommendations for follow up therapy are one component of a multi-disciplinary discharge planning process, led by the attending physician.  Recommendations may be updated based on patient status, additional functional criteria and insurance authorization.  Follow Up Recommendations SNF    Equipment Recommendations  3in1 (PT) (drop arm 3:1)    Recommendations for Other Services       Precautions / Restrictions        Mobility  Bed Mobility Overal bed mobility: Needs Assistance Bed Mobility: Supine to Sit;Sit to Supine     Supine to sit: Supervision Sit to supine: Min assist   General bed mobility comments: Pt not completely transitioning to EOB.  He got R leg off bed, and pushed trunk 3/4 the way up and stayed there stating too much pain to go over R leg.  Required assist to reposition R leg back in bed    Transfers                 General transfer comment: Unable to attempt due to pain.  Does not have drop arm recliner - will  need to do A/P tx or find drop arm for lateral scoot  Ambulation/Gait                Stairs            Wheelchair Mobility    Modified Rankin (Stroke Patients Only)       Balance Overall balance assessment: Needs assistance Sitting-balance support: Single extremity supported Sitting balance-Leahy Scale: Poor Sitting balance - Comments: requiring UE support     Standing balance-Leahy Scale: Zero                               Pertinent Vitals/Pain Pain Assessment: 0-10 Pain Score: 10-Worst pain ever Pain Location: LLE Pain Descriptors / Indicators: Guarding;Discomfort (no grimacing , crying, or moaning) Pain Intervention(s): Limited activity within patient's tolerance;Monitored during session;Premedicated before session    Home Living Family/patient expects to be discharged to:: Skilled nursing facility Living Arrangements: Parent Available Help at Discharge: Family;Available PRN/intermittently Type of Home: House Home Access: Stairs to enter Entrance Stairs-Rails: Psychiatric nurse of Steps: 3 Home Layout: One level Home Equipment: Walker - 2 wheels;Shower seat;Wheelchair - manual Additional Comments: walker is broken    Prior Function Level of Independence: Independent with assistive device(s)         Comments: Prior  to L BKA in July he was ambulating with walker and R prosthesis.  Since L amputation in July has been doing squat pivots and needing increased assist.  Requiring increased questions on time frame and surgeries to obtain PLOF     Hand Dominance   Dominant Hand: Right    Extremity/Trunk Assessment   Upper Extremity Assessment Upper Extremity Assessment: Defer to OT evaluation LUE Deficits / Details: .    Lower Extremity Assessment Lower Extremity Assessment: LLE deficits/detail;RLE deficits/detail RLE Deficits / Details: R BKA (old); ROM WFL, MMT 5/5 LLE Deficits / Details: New L AKA; ROM: very  limited due to pain, no major hip flexion contracture noted; MMT 1/5    Cervical / Trunk Assessment Cervical / Trunk Assessment: Normal  Communication   Communication: No difficulties  Cognition Arousal/Alertness: Awake/alert Behavior During Therapy: WFL for tasks assessed/performed;Impulsive Overall Cognitive Status: No family/caregiver present to determine baseline cognitive functioning Area of Impairment: Safety/judgement;Orientation;Problem solving                 Orientation Level: Situation;Disoriented to (difficulty providing hx)       Safety/Judgement: Decreased awareness of safety;Decreased awareness of deficits   Problem Solving:  (requiring increased time)        General Comments      Exercises Amputee Exercises Hip ABduction/ADduction: AAROM;Left;5 reps;Supine Straight Leg Raises: AAROM;Left;5 reps;Supine Other Exercises Other Exercises: Very limited motion due to pain   Assessment/Plan    PT Assessment Patient needs continued PT services  PT Problem List Decreased strength;Decreased mobility;Decreased activity tolerance;Decreased balance;Pain;Decreased knowledge of use of DME;Decreased knowledge of precautions;Decreased safety awareness;Decreased range of motion       PT Treatment Interventions DME instruction;Functional mobility training;Therapeutic activities;Therapeutic exercise;Balance training;Patient/family education;Wheelchair mobility training;Manual techniques    PT Goals (Current goals can be found in the Care Plan section)  Acute Rehab PT Goals Patient Stated Goal: have less pain PT Goal Formulation: With patient Time For Goal Achievement: 01/19/21 Potential to Achieve Goals: Fair    Frequency Min 2X/week   Barriers to discharge Inaccessible home environment;Decreased caregiver support      Co-evaluation               AM-PAC PT "6 Clicks" Mobility  Outcome Measure Help needed turning from your back to your side while in a  flat bed without using bedrails?: A Little Help needed moving from lying on your back to sitting on the side of a flat bed without using bedrails?: A Little Help needed moving to and from a bed to a chair (including a wheelchair)?: A Lot Help needed standing up from a chair using your arms (e.g., wheelchair or bedside chair)?: Total Help needed to walk in hospital room?: Total Help needed climbing 3-5 steps with a railing? : Total 6 Click Score: 11    End of Session   Activity Tolerance: Patient limited by pain Patient left: in bed;with bed alarm set;with call bell/phone within reach Nurse Communication: Mobility status PT Visit Diagnosis: Other abnormalities of gait and mobility (R26.89);Muscle weakness (generalized) (M62.81);Difficulty in walking, not elsewhere classified (R26.2) Pain - Right/Left: Left Pain - part of body: Leg    Time: 1523-1540 PT Time Calculation (min) (ACUTE ONLY): 17 min   Charges:   PT Evaluation $PT Eval Low Complexity: 1 Low PT Treatments $Therapeutic Activity: 8-22 mins        Keith [name], PT Acute Rehab Services Pager [phone] [facility] [phone]   Keith [name] 01/05/2021, 4:29 PM

## 2021-01-06 LAB — TYPE AND SCREEN
ABO/RH(D): O POS
Antibody Screen: NEGATIVE
Unit division: 0
Unit division: 0

## 2021-01-06 LAB — BPAM RBC
Blood Product Expiration Date: 202210272359
Blood Product Expiration Date: 202210282359
ISSUE DATE / TIME: 202209281606
ISSUE DATE / TIME: 202209301121
Unit Type and Rh: 5100
Unit Type and Rh: 5100

## 2021-01-06 LAB — GLUCOSE, CAPILLARY: Glucose-Capillary: 102 mg/dL — ABNORMAL HIGH (ref 70–99)

## 2021-01-06 NOTE — Progress Notes (Signed)
Pt CBG 87 for 2200 check

## 2021-01-06 NOTE — Progress Notes (Signed)
[name], male   DOB: May 26, 1965, 55 y.o.   MRN: [name] Patient complains of generalized pain.  Difficulties with skilled nursing placement acknowledged.  Evaluate for discharge on Monday.

## 2021-01-06 NOTE — Progress Notes (Signed)
Pt has $350 dollar in zip lock bag, refused for the nurse to send it to security. He rather keep with it.

## 2021-01-07 LAB — GLUCOSE, CAPILLARY
Glucose-Capillary: 131 mg/dL — ABNORMAL HIGH (ref 70–99)
Glucose-Capillary: 148 mg/dL — ABNORMAL HIGH (ref 70–99)
Glucose-Capillary: 132 mg/dL — ABNORMAL HIGH (ref 70–99)
Glucose-Capillary: 107 mg/dL — ABNORMAL HIGH (ref 70–99)

## 2021-01-07 NOTE — Plan of Care (Signed)

## 2021-01-07 NOTE — Progress Notes (Signed)
Subjective: 4 Days Post-Op Procedure(s) (LRB): LEFT ABOVE KNEE AMPUTATION (Left) Patient reports pain as moderate.    Objective: Vital signs in last 24 hours: Temp:  [98.3 F (36.8 C)-98.4 F (36.9 C)] 98.3 F (36.8 C) (10/02 0809) Pulse Rate:  [76-88] 76 (10/02 0809) Resp:  [16-18] 16 (10/02 0809) BP: (127-152)/(81-91) 143/87 (10/02 0809) SpO2:  [98 %-100 %] 100 % (10/02 0809)  Intake/Output from previous day: 10/01 0701 - 10/02 0700 In: 1073.9 [I.V.:1073.9] Out: 1525 [Urine:1525] Intake/Output this shift: Total I/O In: -  Out: 201 [Urine:200; Stool:1]  Recent Labs    01/05/21 0039 01/05/21 1630  HGB 7.5* 8.1*   Recent Labs    01/05/21 0039 01/05/21 1630  WBC 11.4*  --   RBC 2.63*  --   HCT 24.6* 26.0*  PLT 310  --    Recent Labs    01/05/21 0039  NA 136  K 4.3  CL 108  CO2 21*  BUN 33*  CREATININE 2.18*  GLUCOSE 99  CALCIUM 8.7*   No results for input(s): LABPT, INR in the last 72 hours.  Physical Exam Wound vac in place and functioning properly.  No fluid in canister   Assessment/Plan: 4 Days Post-Op Procedure(s) (LRB): LEFT ABOVE KNEE AMPUTATION (Left) Up with therapy [facility]B LLE D/c dispo pending and will be reevaluated tomorrow by Dr. Roger Kill 01/07/2021, 10:13 AM

## 2021-01-07 NOTE — Plan of Care (Signed)
  Problem: Education: Goal: Knowledge of General Education information will improve Description: Including pain rating scale, medication(s)/side effects and non-pharmacologic comfort measures Outcome: Progressing   Problem: Health Behavior/Discharge Planning: Goal: Ability to manage health-related needs will improve Outcome: Not Progressing   Problem: Clinical Measurements: Goal: Ability to maintain clinical measurements within normal limits will improve Outcome: Progressing Goal: Will remain free from infection Outcome: Progressing Goal: Respiratory complications will improve Outcome: Progressing   Problem: Nutrition: Goal: Adequate nutrition will be maintained Outcome: Progressing

## 2021-01-08 LAB — GLUCOSE, CAPILLARY
Glucose-Capillary: 98 mg/dL (ref 70–99)
Glucose-Capillary: 99 mg/dL (ref 70–99)
Glucose-Capillary: 102 mg/dL — ABNORMAL HIGH (ref 70–99)
Glucose-Capillary: 113 mg/dL — ABNORMAL HIGH (ref 70–99)
Glucose-Capillary: 87 mg/dL (ref 70–99)
Glucose-Capillary: 143 mg/dL — ABNORMAL HIGH (ref 70–99)
Glucose-Capillary: 126 mg/dL — ABNORMAL HIGH (ref 70–99)
Glucose-Capillary: 116 mg/dL — ABNORMAL HIGH (ref 70–99)
Glucose-Capillary: 106 mg/dL — ABNORMAL HIGH (ref 70–99)

## 2021-01-08 NOTE — Progress Notes (Signed)
Occupational Therapy Treatment Patient Details Name: Keith [name] MRN: [name] DOB: May 31, 1965 Today's Date: 01/08/2021   History of present illness Pt adm 9/28 for lt AKA on 01/03/21 due to dehiscence of lt BKA from 10/27/20. PMH - rt BKA, lt BKA, AIDS, CVA, CKD, HTN, DM, CAD   OT comments  Pt was encountered lying in bed when OT arrived. Pt reported 10/10 pain in LLE and declined to complete any bed mobility other than rolling and realigning himself in center of bed, which he completed with supervision. Pt declined to practice most ADLs that OT suggested, however did agree to wash his face which he completed with setup A. Pt continues to be appropriate for SNF at d/c. Will follow acutely.   Recommendations for follow up therapy are one component of a multi-disciplinary discharge planning process, led by the attending physician.  Recommendations may be updated based on patient status, additional functional criteria and insurance authorization.    Follow Up Recommendations  SNF;Supervision/Assistance - 24 hour    Equipment Recommendations       Recommendations for Other Services      Precautions / Restrictions Precautions Precautions: Fall Precaution Comments: B BKA's: R healed,  L BKA rev 8/27, RLE prosthesis Restrictions Weight Bearing Restrictions: Yes LLE Weight Bearing: Non weight bearing Other Position/Activity Restrictions: has prosthetic for R LE       Mobility Bed Mobility Overal bed mobility: Needs Assistance   Rolling: Supervision         General bed mobility comments: Pt declined any bed mobility other than rolling due to pain. Pt rolled to R side and aligned self in middle of bed with supervision.    Transfers                      Balance                                           ADL either performed or assessed with clinical judgement   ADL       Grooming: Set up;Wash/dry face                                  General ADL Comments: Pt declined to practice any ADLs other than washing his face, which he completed at bed level. Pt reported that his girlfriend had shaved him earlier, but that he only provided assistance by "turning my head."     Vision       Perception     Praxis      Cognition Arousal/Alertness: Awake/alert Behavior During Therapy: WFL for tasks assessed/performed;Impulsive Overall Cognitive Status: Within Functional Limits for tasks assessed Area of Impairment: Safety/judgement;Orientation;Problem solving                         Safety/Judgement: Decreased awareness of safety;Decreased awareness of deficits              Exercises     Shoulder Instructions       General Comments      Pertinent Vitals/ Pain       Pain Assessment: 0-10 Pain Score: 10-Worst pain ever Pain Location: LLE Pain Descriptors / Indicators: Guarding;Discomfort Pain Intervention(s): Limited activity within patient's tolerance;Monitored during session;Patient requesting pain meds-RN notified  Home Living  Prior Functioning/Environment              Frequency  Min 2X/week        Progress Toward Goals  OT Goals(current goals can now be found in the care plan section)  Progress towards OT goals: Progressing toward goals  Acute Rehab OT Goals Patient Stated Goal: have less pain OT Goal Formulation: With patient Time For Goal Achievement: 01/19/21 Potential to Achieve Goals: Fair ADL Goals Pt Will Perform Upper Body Bathing: with modified independence;sitting Pt Will Perform Lower Body Bathing: with mod assist;sitting/lateral leans;bed level Pt Will Transfer to Toilet: with mod assist;bedside commode;squat pivot transfer Additional ADL Goal #1: Pt will sit at EOB for 5 mins while engaging in ADL task  Plan Discharge plan remains appropriate;Frequency remains appropriate    Co-evaluation                  AM-PAC OT "6 Clicks" Daily Activity     Outcome Measure   Help from another person eating meals?: None Help from another person taking care of personal grooming?: A Little Help from another person toileting, which includes using toliet, bedpan, or urinal?: A Lot Help from another person bathing (including washing, rinsing, drying)?: A Lot Help from another person to put on and taking off regular upper body clothing?: A Little Help from another person to put on and taking off regular lower body clothing?: A Lot 6 Click Score: 16    End of Session    OT Visit Diagnosis: Other abnormalities of gait and mobility (R26.89);Muscle weakness (generalized) (M62.81);Pain;Unsteadiness on feet (R26.81) Pain - Right/Left: Left Pain - part of body: Leg   Activity Tolerance Patient limited by pain   Patient Left in bed;with call bell/phone within reach;with bed alarm set   Nurse Communication Mobility status;Patient requests pain meds        Time: [id number] OT Time Calculation (min): 15 min  Charges: OT General Charges $OT Visit: 1 Visit OT Treatments $Self Care/Home Management : 8-22 mins  Houda Brau C, OT/L  Acute Rehab [place] 01/08/2021, 4:29 PM

## 2021-01-08 NOTE — Progress Notes (Signed)
[name], male   DOB: 04-08-1966, 55 y.o.   MRN: [name] Patient resting comfortable this morning.  Difficulty with skilled nursing placement noted.  There is no drainage in the wound VAC canister.  FL 2 signed.

## 2021-01-08 NOTE — NC FL2 (Signed)
[place] LEVEL OF CARE SCREENING TOOL     IDENTIFICATION  Patient Name: Keith [name] Birthdate: 1965/07/10 Sex: male Admission Date (Current Location): 01/03/2021  [facility] and [place] Number:  Herbalist and Address:  [facility], [place] [address], [place], [place] [address]      Provider Number: [id number]  Attending Physician Name and Address:  [name], MD  Relative Name and Phone Number:  [name], mother,    [phone]    Current Level of Care:   Recommended Level of Care: [place] Prior Approval Number:    Date Approved/Denied:   PASRR Number: [id number]  Discharge Plan: Home    Current Diagnoses: Patient Active Problem List   Diagnosis Date Noted   Wound dehiscence 01/03/2021   Coagulopathy ([place]) 12/22/2020   CAD (coronary artery disease) 12/22/2020   Obesity 12/22/2020   Gross hematuria 12/20/2020   ABLA (acute blood loss anemia) 12/20/2020   Dehiscence of amputation stump (HCC)    Hx of BKA, left ([place])    Cellulitis of left foot    Acute osteomyelitis of left calcaneus ([place])    Ulcer of heel due to diabetes mellitus ([place])    Normochromic normocytic anemia    Cellulitis 10/18/2020   Atypical chest pain [id number]   Acute metabolic encephalopathy [id number]   Hyperglycemia due to diabetes mellitus ([place]) 08/29/2020   Hyperglycemia 08/29/2020   Acute cerebrovascular accident (CVA) due to ischemia ([place]) 04/28/2020   Type 2 diabetes mellitus with hyperlipidemia (HCC)    PAD (peripheral artery disease) ([place])    Hypoglycemia 04/25/2020   Acute ST elevation myocardial infarction (STEMI) due to occlusion of circumflex coronary artery ([place]) 03/22/2020   Uncontrolled type 2 diabetes mellitus with hyperglycemia ([place]) 01/14/2019   Elevated glucose 01/14/2019   Hx of BKA, right ([place]) 01/14/2019   Pressure injury of skin 11/11/2018   STEMI (ST elevation myocardial infarction) ([place]) 11/10/2018    CKD (chronic kidney disease) stage 4, GFR 15-29 ml/min (HCC) 11/10/2018   Amputation stump infection ([place]) 10/28/2018   Type II diabetes mellitus with renal manifestations ([place]) 08/07/2018   Uncontrolled type 2 diabetes mellitus with hyperosmolar nonketotic hyperglycemia ([place]) 08/07/2018   Non-pressure chronic ulcer of right calf limited to breakdown of skin ([place]) 07/06/2018   Type 2 diabetes mellitus without complication, without long-term current use of insulin ([place]) 06/15/2018   Chronic low back pain without sciatica 06/15/2018   Idiopathic chronic venous hypertension of left lower extremity with ulcer and inflammation (HCC)    Venous stasis ulcer of left calf limited to breakdown of skin without varicose veins ([place]) 04/23/2018   Acquired absence of right leg below knee ([place]) 06/13/2017   Acute kidney injury superimposed on CKD ([place]) 03/15/2017   Diabetic polyneuropathy associated with type 2 diabetes mellitus ([employer]) 01/23/2017   AKI (acute kidney injury) ([place]) 09/28/2016   Nausea vomiting and diarrhea 09/28/2016   Onychomycosis of multiple toenails with type 2 diabetes mellitus ([place]) 08/29/2015   MRSA carrier 04/20/2014   Penile wart 02/22/2014   HIV disease ([place])    Insulin-requiring or dependent type II diabetes mellitus ([place]) 02/04/2014   Dental anomaly 11/20/2012   Arthritis of right knee 02/23/2012   Hyperlipidemia 11/10/2011   Hyponatremia 11/10/2011   Chronic pain 08/07/2011   Meralgia paraesthetica 04/23/2011   ERECTILE DYSFUNCTION 08/22/2008   Elevated blood pressure reading with diagnosis of hypertension 05/19/2008    Orientation RESPIRATION BLADDER Height & Weight  Self, Time, Situation, Place  Normal Continent Weight: 265 lb (120.2 kg) Height:  '5\' 9"'$  (175.3 cm)  BEHAVIORAL SYMPTOMS/MOOD NEUROLOGICAL BOWEL NUTRITION STATUS        Diet  AMBULATORY STATUS COMMUNICATION OF NEEDS Skin   Limited Assist Verbally Surgical wounds                       Personal  Care Assistance Level of Assistance  Bathing, Feeding, Dressing Bathing Assistance: Limited assistance Feeding assistance: Independent Dressing Assistance: Limited assistance     Functional Limitations Info  Sight, Hearing, Speech Sight Info: Adequate Hearing Info: Adequate Speech Info: Adequate    SPECIAL CARE FACTORS FREQUENCY  PT (By licensed PT), OT (By licensed OT)     PT Frequency: 5x a week OT Frequency: 5x a week            Contractures Contractures Info: Not present    Additional Factors Info  Code Status, Allergies, Insulin Sliding Scale Code Status Info: Full Allergies Info: Elemental Sulfur   Sulfa Antibiotics   Insulin Sliding Scale Info: See DC summary       Current Medications (01/08/2021):  This is the current hospital active medication list Current Facility-Administered Medications  Medication Dose Route Frequency Provider Last Rate Last Admin   0.9 %  sodium chloride infusion   Intravenous Continuous Persons, Bevely Palmer, [place]   Stopped at 01/04/21 1831   acetaminophen (TYLENOL) tablet 325-650 mg  325-650 mg Oral Q6H PRN Persons, Bevely Palmer, PA   650 mg at 01/04/21 1327   alfuzosin (UROXATRAL) 24 hr tablet 10 mg  10 mg Oral Q breakfast Persons, Bevely Palmer, PA   10 mg at 01/08/21 [id number]   allopurinol (ZYLOPRIM) tablet 100 mg  100 mg Oral Daily Persons, Bevely Palmer, PA   100 mg at 01/08/21 [id number]   ALPRAZolam [name]) tablet 1 mg  1 mg Oral Daily PRN Persons, Bevely Palmer, PA   1 mg at 01/07/21 2105   alum & mag hydroxide-simeth (MAALOX/MYLANTA) 200-200-20 MG/5ML suspension 15-30 mL  15-30 mL Oral Q2H PRN Persons, Bevely Palmer, PA       ascorbic acid (VITAMIN C) tablet 1,000 mg  1,000 mg Oral Daily Persons, Bevely Palmer, PA   1,000 mg at 01/08/21 [id number]   aspirin EC tablet 81 mg  81 mg Oral Daily Persons, Bevely Palmer, PA   81 mg at 01/08/21 0926   atorvastatin (LIPITOR) tablet 80 mg  80 mg Oral Daily Persons, Bevely Palmer, PA   80 mg at 01/08/21 [id number]   bisacodyl (DULCOLAX) EC tablet 5  mg  5 mg Oral Daily PRN Persons, Bevely Palmer, PA       carvedilol (COREG) tablet 3.125 mg  3.125 mg Oral BID Persons, Bevely Palmer, PA   3.125 mg at 01/08/21 [id number]   Chlorhexidine Gluconate Cloth 2 % PADS 6 each  6 each Topical Daily [name], MD   6 each at 01/08/21 [id number]   dapsone tablet 100 mg  100 mg Oral Daily Persons, Bevely Palmer, PA   100 mg at 01/08/21 [id number]   docusate sodium (COLACE) capsule 100 mg  100 mg Oral Daily Persons, Bevely Palmer, PA   100 mg at 01/08/21 0925   dolutegravir (TIVICAY) tablet 50 mg  50 mg Oral Daily [name], MD   50 mg at 01/08/21 [id number]   And   lamiVUDine (EPIVIR) tablet 300 mg  300 mg Oral Daily [name], MD  300 mg at 01/08/21 [id number]   doravirine (PIFELTRO) tablet 100 mg  100 mg Oral Daily Persons, Bevely Palmer, PA   100 mg at 01/08/21 0929   escitalopram (LEXAPRO) tablet 20 mg  20 mg Oral Daily Persons, Bevely Palmer, [place]   20 mg at 01/08/21 [id number]   finasteride (PROSCAR) tablet 5 mg  5 mg Oral Daily Persons, Bevely Palmer, PA   5 mg at 01/08/21 [id number]   gabapentin (NEURONTIN) capsule 100 mg  100 mg Oral TID Persons, Bevely Palmer, PA   100 mg at 01/08/21 0925   guaiFENesin-dextromethorphan (ROBITUSSIN DM) 100-10 MG/5ML syrup 15 mL  15 mL Oral Q4H PRN Persons, Bevely Palmer, PA       hydrALAZINE (APRESOLINE) injection 5 mg  5 mg Intravenous Q20 Min PRN Persons, Bevely Palmer, PA   5 mg at 01/03/21 1751   HYDROmorphone (DILAUDID) injection 0.5-1 mg  0.5-1 mg Intravenous Q4H PRN Persons, Bevely Palmer, PA   1 mg at 01/08/21 [id number]   icosapent Ethyl (VASCEPA) 1 g capsule 2 g  2 g Oral BID Persons, Bevely Palmer, PA   2 g at 01/08/21 [id number]   insulin aspart (novoLOG) injection 0-15 Units  0-15 Units Subcutaneous TID WC Persons, Bevely Palmer, PA   2 Units at 01/08/21 0836   labetalol (NORMODYNE) injection 10 mg  10 mg Intravenous Q10 min PRN Persons, Bevely Palmer, PA       magnesium sulfate IVPB 2 g 50 mL  2 g Intravenous Daily PRN Persons, Bevely Palmer, PA       multivitamin with minerals tablet 1 tablet  1  tablet Oral Daily [name], MD   1 tablet at 01/07/21 1330   nitroGLYCERIN (NITROSTAT) SL tablet 0.4 mg  0.4 mg Sublingual Q5 Min x 3 PRN Persons, Bevely Palmer, PA       ondansetron [facility]) injection 4 mg  4 mg Intravenous Q6H PRN Persons, Bevely Palmer, PA       oxybutynin (DITROPAN) tablet 2.5 mg  2.5 mg Oral BID Persons, Bevely Palmer, PA   2.5 mg at 01/08/21 [id number]   oxyCODONE (Oxy IR/ROXICODONE) immediate release tablet 10-15 mg  10-15 mg Oral Q4H PRN Persons, Bevely Palmer, PA   15 mg at 01/07/21 1057   oxyCODONE (Oxy IR/ROXICODONE) immediate release tablet 5-10 mg  5-10 mg Oral Q4H PRN Persons, Bevely Palmer, PA   10 mg at 01/05/21 1240   pantoprazole (PROTONIX) EC tablet 40 mg  40 mg Oral Daily Persons, Bevely Palmer, PA   40 mg at 01/08/21 0925   phenol (CHLORASEPTIC) mouth spray 1 spray  1 spray Mouth/Throat PRN Persons, Bevely Palmer, PA       polyethylene glycol (MIRALAX / GLYCOLAX) packet 17 g  17 g Oral Daily PRN Persons, Bevely Palmer, PA       potassium chloride SA (KLOR-CON) CR tablet 20-40 mEq  20-40 mEq Oral Daily PRN Persons, Bevely Palmer, PA       traZODone (DESYREL) tablet 100 mg  100 mg Oral QHS Persons, Bevely Palmer, [place]   100 mg at 01/07/21 2105   zinc sulfate capsule 220 mg  220 mg Oral Daily Persons, Bevely Palmer, PA   220 mg at 01/08/21 [id number]     Discharge Medications: Please see discharge summary for a list of discharge medications.  Relevant Imaging Results:  Relevant Lab Results:   Additional Information SS#: [id number]. JANSSEN COVID-19 VACCINE 11/12/2019 only  Emeterio Reeve, LCSW

## 2021-01-09 LAB — GLUCOSE, CAPILLARY
Glucose-Capillary: 121 mg/dL — ABNORMAL HIGH (ref 70–99)
Glucose-Capillary: 154 mg/dL — ABNORMAL HIGH (ref 70–99)
Glucose-Capillary: 136 mg/dL — ABNORMAL HIGH (ref 70–99)
Glucose-Capillary: 104 mg/dL — ABNORMAL HIGH (ref 70–99)

## 2021-01-09 NOTE — Consult Note (Signed)
   [facility] CM Inpatient Consult   01/09/2021  Keith [name] 08/23/1965 [id number]  [facility] [[facility]]  Accountable Care Organization [ACO] Patient:  [employer] Medicare  Readmission less than 7 days, extreme high risk score for unplanned readmission risk  Patient is currently active with [facility] [[facility]] Care Management for chronic disease management services.  Patient has been engaged by a [facility] Telephonic RN Care Coordinator.  Our community based plan of care has focused on disease management and community resource support.  Patient readmission with dehiscence of left Below Knee amputation.  Plan: Following for disposition is being recommended for a skilled nursing facility level of care.  Will follow with  Inpatient Transition Of Care [TOC] team member for transition.  If patient admits to a skilled nursing facility his post hospital transition of care needs are to be met at that level of care.  Of note, [facility] Care Management services does not replace or interfere with any services that are needed or arranged by inpatient TOC care management team.  For additional questions or referrals please contact:  Victoria Brewer, RN BSN CCM [facility] Hospital Liaison  [phone] business mobile phone Toll free office [phone]  Fax number: [phone] [email or web address]    

## 2021-01-09 NOTE — Progress Notes (Signed)
Physical Therapy Treatment Patient Details Name: Keith [name] MRN: [name] DOB: 04-17-1965 Today's Date: 01/09/2021   History of Present Illness Pt adm 9/28 for lt AKA on 01/03/21 due to dehiscence of lt BKA from 10/27/20. PMH - rt BKA, lt BKA, AIDS, CVA, CKD, HTN, DM, CAD    PT Comments    Pt. Demos improved participation with PT during today's tx session.  Able to transfer fully to EOB with min A only.  Demos fatigue and dec activity tolerance limiting ability to maintain sitting position mod I for longer than ~2-3 min at a time.  If pt. Demos improved sitting balance next tx session, would benefit from trial of lateral/scoot transfer to chair.  Pt. Demos improved tolerance to therex during today's tx as well and is able to actively assist with R residual limb ROM.  Pt. Would benefit from continued skilled PT in acute care to address transfer training, sitting balance, and LE strength deficits.    Recommendations for follow up therapy are one component of a multi-disciplinary discharge planning process, led by the attending physician.  Recommendations may be updated based on patient status, additional functional criteria and insurance authorization.  Follow Up Recommendations  SNF     Equipment Recommendations       Recommendations for Other Services       Precautions / Restrictions Restrictions LLE Weight Bearing: Non weight bearing     Mobility  Bed Mobility Overal bed mobility: Needs Assistance Bed Mobility: Supine to Sit;Sit to Supine     Supine to sit: Min assist Sit to supine: Min assist   General bed mobility comments: Pt. able to scoot residual limbs over towards EOB and comes up to partial sitting with use of bedrail.  Req's min A for HH support in order to complete transfer into upright position.  Encouraged to use UEs to help him maintain positioning, but pt. fatigues and will lean towards R side.  Trialed sitting fully upright x 3.  Able to hold positioning for  ~2-3 min per each trial.  Use of chuck pad to scoot pelvis closer to EOB to assit with balance.  Returns BTB with mod IND, needs mod A with use of chuck pad to reposition pelvis in bed. Patient Response: Cooperative  Transfers                    Ambulation/Gait                 Stairs             Wheelchair Mobility    Modified Rankin (Stroke Patients Only)       Balance Overall balance assessment: Needs assistance Sitting-balance support: Bilateral upper extremity supported Sitting balance-Leahy Scale: Poor Sitting balance - Comments: Tendency to lean/fall towards R side.                                    Cognition Arousal/Alertness: Lethargic Behavior During Therapy: WFL for tasks assessed/performed Overall Cognitive Status: Within Functional Limits for tasks assessed                                 General Comments: Initially declines therapy due to pain.  With inc encouragement is agreeable to trial sitting EOB. Pt. often closes eyes during therapy, appears to fall asleep during therex.  Exercises Amputee Exercises Hip ABduction/ADduction: AROM;Right;10 reps;AAROM;Left Straight Leg Raises: AROM;Right;10 reps;AAROM;Left    General Comments        Pertinent Vitals/Pain Pain Assessment: 0-10 Pain Score: 10-Worst pain ever Pain Location: Reports 10/10 pain in L residual limb Pain Descriptors / Indicators: Constant;Discomfort;Tender;Throbbing Pain Intervention(s): Limited activity within patient's tolerance;Monitored during session    Home Living                      Prior Function            PT Goals (current goals can now be found in the care plan section) Progress towards PT goals: Progressing toward goals    Frequency           PT Plan Current plan remains appropriate    Co-evaluation              AM-PAC PT "6 Clicks" Mobility   Outcome Measure  Help needed turning from  your back to your side while in a flat bed without using bedrails?: A Little Help needed moving from lying on your back to sitting on the side of a flat bed without using bedrails?: A Little Help needed moving to and from a bed to a chair (including a wheelchair)?: A Lot Help needed standing up from a chair using your arms (e.g., wheelchair or bedside chair)?: Total Help needed to walk in hospital room?: Total Help needed climbing 3-5 steps with a railing? : Total 6 Click Score: 11    End of Session   Activity Tolerance: Patient limited by fatigue Patient left: in bed;with call bell/phone within reach;with bed alarm set         Time: [id number] PT Time Calculation (min) (ACUTE ONLY): 14 min  Charges:  $Therapeutic Activity: 8-22 mins                     Deloyce Walthers A. Keeshia Sanderlin, PT, DPT Acute Rehabilitation Services Office: [place] 01/09/2021, 1:32 PM

## 2021-01-09 NOTE — Progress Notes (Signed)
[name], male   DOB: Apr 15, 1965, 55 y.o.   MRN: [name] Patient sleeping comfortably this morning when awakened he complains of pain.  COVID test ordered awaiting discharge to skilled nursing.  No drainage in the wound VAC canister.

## 2021-01-10 ENCOUNTER — Encounter: Payer: Self-pay | Admitting: *Deleted

## 2021-01-10 ENCOUNTER — Ambulatory Visit: Payer: Medicare HMO | Admitting: Nurse Practitioner

## 2021-01-10 ENCOUNTER — Other Ambulatory Visit: Payer: Self-pay | Admitting: *Deleted

## 2021-01-10 LAB — GLUCOSE, CAPILLARY
Glucose-Capillary: 115 mg/dL — ABNORMAL HIGH (ref 70–99)
Glucose-Capillary: 120 mg/dL — ABNORMAL HIGH (ref 70–99)
Glucose-Capillary: 120 mg/dL — ABNORMAL HIGH (ref 70–99)

## 2021-01-10 MED ORDER — OXYCODONE-ACETAMINOPHEN 10-325 MG PO TABS: 1.0000 | ORAL_TABLET | ORAL | 0 refills | Status: DC | PRN

## 2021-01-10 NOTE — TOC Initial Note (Signed)
Transition of Care [facility]) - Initial/Assessment Note    Patient Details  Name: Keith [name] MRN: [name] Date of Birth: September 29, 1965  Transition of Care [facility]) CM/SW Contact:    Emeterio Reeve, LCSW Phone Number: 01/10/2021, 3:25 PM  Clinical Narrative:                  CSW spoke to pt at bedside. Pt agreeable to [facility] made a bed offer and is starting insurance auth today.  Expected Discharge Plan: Skilled Nursing Facility Barriers to Discharge: Insurance Authorization   Patient Goals and CMS Choice Patient states their goals for this hospitalization and ongoing recovery are:: SNF CMS Medicare.gov Compare Post Acute Care list provided to:: Patient Choice offered to / list presented to : Patient  Expected Discharge Plan and Services Expected Discharge Plan: [place]       Living arrangements for the past 2 months: Single Family Home Expected Discharge Date: 01/10/21                                    Prior Living Arrangements/Services Living arrangements for the past 2 months: Single Family Home Lives with:: Parents Patient language and need for interpreter reviewed:: Yes Do you feel safe going back to the place where you live?: Yes      Need for Family Participation in Patient Care: Yes (Comment) Care giver support system in place?: Yes (comment)   Criminal Activity/Legal Involvement Pertinent to Current Situation/Hospitalization: No - Comment as needed  Activities of Daily Living Home Assistive Devices/Equipment: Grab bars in shower, Wheelchair, CBG Meter, Shower chair without back ADL Screening (condition at time of admission) Patient's cognitive ability adequate to safely complete daily activities?: Yes Is the patient deaf or have difficulty hearing?: No Does the patient have difficulty seeing, even when wearing glasses/contacts?: No Does the patient have difficulty concentrating, remembering, or making decisions?: No Patient  able to express need for assistance with ADLs?: Yes Does the patient have difficulty dressing or bathing?: No Independently performs ADLs?: Yes (appropriate for developmental age) Does the patient have difficulty walking or climbing stairs?: Yes Weakness of Legs: Both Weakness of Arms/Hands: None  Permission Sought/Granted Permission sought to share information with : Facility Art therapist granted to share information with : Yes, Verbal Permission Granted     Permission granted to share info w AGENCY: SNF        Emotional Assessment Appearance:: Appears stated age Attitude/Demeanor/Rapport: Engaged Affect (typically observed): Appropriate Orientation: : Oriented to Self, Oriented to Place, Oriented to  Time, Oriented to Situation Alcohol / Substance Use: Not Applicable Psych Involvement: No (comment)  Admission diagnosis:  Wound dehiscence [T81.30XA] Patient Active Problem List   Diagnosis Date Noted   Wound dehiscence 01/03/2021   Coagulopathy ([place]) 12/22/2020   CAD (coronary artery disease) 12/22/2020   Obesity 12/22/2020   Gross hematuria 12/20/2020   ABLA (acute blood loss anemia) 12/20/2020   Dehiscence of amputation stump (HCC)    Hx of BKA, left ([place])    Cellulitis of left foot    Acute osteomyelitis of left calcaneus ([place])    Ulcer of heel due to diabetes mellitus ([place])    Normochromic normocytic anemia    Cellulitis 10/18/2020   Atypical chest pain 123XX123   Acute metabolic encephalopathy 123XX123   Hyperglycemia due to diabetes mellitus ([place]) 08/29/2020   Hyperglycemia 08/29/2020   Acute cerebrovascular accident (  CVA) due to ischemia ([place]) 04/28/2020   Type 2 diabetes mellitus with hyperlipidemia (HCC)    PAD (peripheral artery disease) (HCC)    Hypoglycemia 04/25/2020   Acute ST elevation myocardial infarction (STEMI) due to occlusion of circumflex coronary artery ([place]) 03/22/2020   Uncontrolled type 2 diabetes mellitus with  hyperglycemia ([place]) 01/14/2019   Elevated glucose 01/14/2019   Hx of BKA, right ([place]) 01/14/2019   Pressure injury of skin 11/11/2018   STEMI (ST elevation myocardial infarction) ([place]) 11/10/2018   CKD (chronic kidney disease) stage 4, GFR 15-29 ml/min ([place]) 11/10/2018   Amputation stump infection ([place]) 10/28/2018   Type II diabetes mellitus with renal manifestations ([place]) 08/07/2018   Uncontrolled type 2 diabetes mellitus with hyperosmolar nonketotic hyperglycemia ([place]) 08/07/2018   Non-pressure chronic ulcer of right calf limited to breakdown of skin ([place]) 07/06/2018   Type 2 diabetes mellitus without complication, without long-term current use of insulin ([place]) 06/15/2018   Chronic low back pain without sciatica 06/15/2018   Idiopathic chronic venous hypertension of left lower extremity with ulcer and inflammation (HCC)    Venous stasis ulcer of left calf limited to breakdown of skin without varicose veins ([place]) 04/23/2018   Acquired absence of right leg below knee ([place]) 06/13/2017   Acute kidney injury superimposed on CKD ([place]) 03/15/2017   Diabetic polyneuropathy associated with type 2 diabetes mellitus ([place]) 01/23/2017   AKI (acute kidney injury) ([place]) 09/28/2016   Nausea vomiting and diarrhea 09/28/2016   Onychomycosis of multiple toenails with type 2 diabetes mellitus ([place]) 08/29/2015   MRSA carrier 04/20/2014   Penile wart 02/22/2014   HIV disease ([place])    Insulin-requiring or dependent type II diabetes mellitus ([place]) 02/04/2014   Dental anomaly 11/20/2012   Arthritis of right knee 02/23/2012   Hyperlipidemia 11/10/2011   Hyponatremia 11/10/2011   Chronic pain 08/07/2011   Meralgia paraesthetica 04/23/2011   ERECTILE DYSFUNCTION 08/22/2008   Elevated blood pressure reading with diagnosis of hypertension 05/19/2008   PCP:  [name], NP Pharmacy[facility]   [id number] [place], [id number]: [phone]: [phone] [id number] [address]one: [phone]: [phone] [address]: [phone]: [phone]   Social Determinants of Health (SDOH) Interventions    Readmission Risk Interventions Readmission Risk Prevention Plan 10/21/2020 11/13/2018 11/13/2018  Transportation Screening Complete - Complete  PCP or Specialist Appt within 3-5 Days - - -  [place] - -  Medication Review (RPress photographerComplete - Complete  PCP or Specialist appointment within 3-5 days of discharge Complete Complete -  HRLoupr Home Care Consult Complete Complete -  SW Recovery Care/Counseling Consult Complete Complete -  Palliative Care Screening Not Applicable Not Applicable -  SkRio Grandeomplete Not Applicable -  Some encounter information is confidential and restricted. Go to Review Flowsheets activity to see all data.  Some recent data might be hidden   MiEmeterio ReeveLCSW Clinical Social Worker

## 2021-01-10 NOTE — Progress Notes (Signed)
[name], male   DOB: [birth year], 55 y.o.   MRN: [name] Patient is still awaiting bed acceptance for skilled nursing placement.  Discharge order was completed prescription is on his chart.  Plan for discharge when bed is available.  Patient without complaints this morning.

## 2021-01-10 NOTE — TOC CAGE-AID Note (Signed)
Transition of Care [facility]) - CAGE-AID Screening   Patient Details  Name: Keith [name] MRN: [id number] Date of Birth: 13-Jul-1965  Transition [facility]) CM/SW Contact:    Emeterio Reeve, LCSW Phone Number: 01/10/2021, 3:59 PM   Clinical Narrative:  CSW met with pt at bedside. Pt stated he does not use any substances. Pt declined resources.   CAGE-AID Screening:    Have You Ever Felt You Ought to Cut Down on Your Drinking or Drug Use?: No Have People Annoyed You By Critizing Your Drinking Or Drug Use?: No Have You Felt Bad Or Guilty About Your Drinking Or Drug Use?: No Have You Ever Had a Drink or Used Drugs First Thing In The Morning to Steady Your Nerves or to Get Rid of a Hangover?: No CAGE-AID Score: 0  Substance Abuse Education Offered: Yes  Substance abuse interventions: Educational Materials   Emeterio Reeve, LCSW Clinical Social Worker

## 2021-01-10 NOTE — Patient Outreach (Signed)
[facility]) Care Management  01/10/2021  JAVAUN DEETER 1966/02/06 [name]   [facility] Care coordination- hospitalized, chart review  Mr Lockett noted to be still hospitalized at the time of the scheduled time of [facility] outreach He admitted on 01/03/21 history (hx) of left BKA (Below knee amputation) due to dehiscence of left BKA from 10/27/20 PMH right BKA (right prosthesis), AIDS, CVA, CKD, HTN, DM, CAD    With review of EPIC 01/10/21 notes, inpatient SW indicates pt agreeable to [place] snf placement pending insurance authorization  Home Equipment: Environmental consultant - 2 wheels;Shower seat;Wheelchair - manual Additional Comments: walker is broken   Barriers to discharge inaccessible home environment, decreased care giver support   Plan [facility] RN CM will follow up with patient within the next 14-21 business days- pending snf stay RN CM will update [facility] multidisciplinary team, template completed    Aften Lipsey L. [name], RN, BSN, [place] Coordinator Office number [phone] Mobile number [phone]  Main [facility] number [phone] Fax number [phone]

## 2021-01-11 LAB — GLUCOSE, CAPILLARY
Glucose-Capillary: 155 mg/dL — ABNORMAL HIGH (ref 70–99)
Glucose-Capillary: 194 mg/dL — ABNORMAL HIGH (ref 70–99)
Glucose-Capillary: 142 mg/dL — ABNORMAL HIGH (ref 70–99)
Glucose-Capillary: 150 mg/dL — ABNORMAL HIGH (ref 70–99)
Glucose-Capillary: 174 mg/dL — ABNORMAL HIGH (ref 70–99)
Glucose-Capillary: 102 mg/dL — ABNORMAL HIGH (ref 70–99)

## 2021-01-11 NOTE — Discharge Summary (Signed)
Discharge Diagnoses:  Active Problems:   Wound dehiscence   Surgeries: Procedure(s): LEFT ABOVE KNEE AMPUTATION on 01/03/2021    Consultants:   Discharged Condition: Improved  Hospital Course: Keith [name] is an 55 y.o. male who was admitted 01/03/2021 with a chief complaint of dehiscence left transtibial amputation, with a final diagnosis of Dehiscence Left Below Knee Amputation.  Patient was brought to the operating room on 01/03/2021 and underwent Procedure(s): LEFT ABOVE KNEE AMPUTATION.    Patient was given perioperative antibiotics:  Anti-infectives (From admission, onward)    Start     Dose/Rate Route Frequency Ordered Stop   01/05/21 1200  dapsone tablet 100 mg        100 mg Oral Daily 01/05/21 1106     01/04/21 1000  doravirine (PIFELTRO) tablet 100 mg        100 mg Oral Daily 01/03/21 1503     01/04/21 1000  dolutegravir (TIVICAY) tablet 50 mg       See Hyperspace for full Linked Orders Report.   50 mg Oral Daily 01/03/21 1511     01/04/21 1000  lamiVUDine (EPIVIR) tablet 300 mg       See Hyperspace for full Linked Orders Report.   300 mg Oral Daily 01/03/21 1511     01/03/21 2000  ceFAZolin (ANCEF) IVPB 2g/100 mL premix        2 g 200 mL/hr over 30 Minutes Intravenous Every 8 hours 01/03/21 1503 01/04/21 0340   01/03/21 1600  Dolutegravir-lamiVUDine 50-300 MG TABS 1 tablet  Status:  Discontinued        1 tablet Oral Daily 01/03/21 1503 01/03/21 1510   01/03/21 0845  ceFAZolin (ANCEF) IVPB 3g/100 mL premix  Status:  Discontinued        3 g 200 mL/hr over 30 Minutes Intravenous On call to O.R. 01/03/21 0814 01/03/21 1326     .  Patient was given sequential compression devices, early ambulation, and aspirin for DVT prophylaxis.  Recent vital signs: Patient Vitals for the past 24 hrs:  BP Temp Temp src Pulse Resp SpO2  01/11/21 0558 (!) 131/93 98.1 F (36.7 C) Oral 78 16 99 %  01/10/21 2125 (!) 132/98 98.9 F (37.2 C) Oral 85 17 98 %  01/10/21 1500 (!) 123/94  98.2 F (36.8 C) Oral 87 14 100 %  .  Recent laboratory studies: No results found.  Discharge Medications:   Allergies as of 01/11/2021       Reactions   Elemental Sulfur Itching   Patient stated he's allergic to "sulfur" AND "sulfa"   Sulfa Antibiotics Itching        Medication List     STOP taking these medications    Oxycodone HCl 10 MG Tabs       TAKE these medications    acetaminophen 325 MG tablet Commonly known as: TYLENOL Take 2 tablets (650 mg total) by mouth every 4 (four) hours as needed for mild pain, fever or headache.   alfuzosin 10 MG 24 hr tablet Commonly known as: UROXATRAL Take 1 tablet (10 mg total) by mouth daily with breakfast.   allopurinol 100 MG tablet Commonly known as: ZYLOPRIM Take 1 tablet (100 mg total) by mouth daily.   ALPRAZolam 1 MG tablet Commonly known as: XANAX Take 1 tablet (1 mg total) by mouth daily as needed for anxiety.   aspirin 81 MG EC tablet Take 1 tablet (81 mg total) by mouth daily.   atorvastatin 80 MG tablet Commonly  known as: LIPITOR Take 1 tablet (80 mg total) by mouth daily.   blood glucose meter kit and supplies Dispense based on patient and insurance preference. Use up to four times daily as directed. (FOR ICD-10 E10.9, E11.9).   carvedilol 3.125 MG tablet Commonly known as: Coreg Take 1 tablet (3.125 mg total) by mouth 2 (two) times daily.   Cool Blood Glucose Test Strips test strip Generic drug: glucose blood Use as instructed   dapsone 100 MG tablet TAKE 1 TABLET (100 MG TOTAL) BY MOUTH DAILY. What changed: how much to take   Dovato 50-300 MG Tabs Generic drug: Dolutegravir-lamiVUDine Take 1 tablet by mouth daily.   escitalopram 20 MG tablet Commonly known as: LEXAPRO Take 20 mg by mouth daily.   FeroSul 325 (65 FE) MG tablet Generic drug: ferrous sulfate Take 1 tablet (325 mg total) by mouth every Monday, Wednesday, and Friday at 6 PM. Do not take with other medicines.   finasteride  5 MG tablet Commonly known as: PROSCAR Take 1 tablet (5 mg total) by mouth daily.   gabapentin 100 MG capsule Commonly known as: NEURONTIN Take 1 capsule (100 mg total) by mouth 3 (three) times daily.   nitroGLYCERIN 0.4 MG SL tablet Commonly known as: NITROSTAT PLACE 1 TABLET (0.4 MG TOTAL) UNDER THE TONGUE EVERY FIVE MINUTES X 3 DOSES AS NEEDED FOR CHEST PAIN. What changed: how much to take   onetouch ultrasoft lancets Use as instructed   oxybutynin 5 MG tablet Commonly known as: DITROPAN Take 1/2 tablet (2.5 mg total) by mouth 2 (two) times daily.   oxyCODONE-acetaminophen 10-325 MG tablet Commonly known as: PERCOCET Take 1 tablet by mouth every 4 (four) hours as needed for pain.   pantoprazole 40 MG tablet Commonly known as: PROTONIX Take 1 tablet (40 mg total) by mouth daily.   Pifeltro 100 MG Tabs tablet Generic drug: doravirine Take 1 tablet (100 mg total) by mouth daily.   traZODone 100 MG tablet Commonly known as: DESYREL Take 100 mg by mouth at bedtime.   Vascepa 1 g capsule Generic drug: icosapent Ethyl TAKE 2 CAPSULES (2 G TOTAL) BY MOUTH TWO TIMES DAILY. What changed:  how much to take how to take this when to take this        Diagnostic Studies: US RENAL  Result Date: 12/18/2020 CLINICAL DATA:  Acute renal insufficiency EXAM: RENAL / URINARY TRACT ULTRASOUND COMPLETE COMPARISON:  12/11/2020 FINDINGS: Right Kidney: Renal measurements: 11.7 x 6.0 x 4.8 cm = volume: 175 mL. Renal cortical echogenicity is diffusely mildly increased, in keeping with changes of underlying medical renal disease, unchanged from prior examination. Cortical thickness is preserved. No hydronephrosis. No intrarenal masses or calcifications are seen. Left Kidney: Renal measurements: 11.4 x 5.1 x 5.3 cm = volume: 160 mL. Similarly, renal cortical echogenicity is mildly increased, similar to prior examination, in keeping with underlying medical renal disease. Cortical thickness is  preserved. No hydronephrosis. No intrarenal masses or calcifications are seen. Bladder: Foley catheter balloon is seen within a decompressed bladder lumen. Other: None. IMPRESSION: Stable mild cortical hyperechogenicity in keeping with underlying medical renal disease. No hydronephrosis. Electronically Signed   By: Fidela Salisbury M.D.   On: 12/18/2020 16:07    Patient benefited maximally from their hospital stay and there were no complications.     Disposition: Discharge disposition: 03-Skilled Nursing Facility      Discharge Instructions     Call MD / Call 911   Complete by: As directed  If you experience chest pain or shortness of breath, CALL 911 and be transported to the hospital emergency room.  If you develope a fever above 101 F, pus (white drainage) or increased drainage or redness at the wound, or calf pain, call your surgeon's office.   Call MD / Call 911   Complete by: As directed    If you experience chest pain or shortness of breath, CALL 911 and be transported to the hospital emergency room.  If you develope a fever above 101 F, pus (white drainage) or increased drainage or redness at the wound, or calf pain, call your surgeon's office.   Constipation Prevention   Complete by: As directed    Drink plenty of fluids.  Prune juice may be helpful.  You may use a stool softener, such as Colace (over the counter) 100 mg twice a day.  Use MiraLax (over the counter) for constipation as needed.   Constipation Prevention   Complete by: As directed    Drink plenty of fluids.  Prune juice may be helpful.  You may use a stool softener, such as Colace (over the counter) 100 mg twice a day.  Use MiraLax (over the counter) for constipation as needed.   Diet - low sodium heart healthy   Complete by: As directed    Diet - low sodium heart healthy   Complete by: As directed    Increase activity slowly as tolerated   Complete by: As directed    Increase activity slowly as tolerated    Complete by: As directed    Negative Pressure Wound Therapy - Incisional   Complete by: As directed    Show patient how to attach preveena vac. Call office if alarms   Post-operative opioid taper instructions:   Complete by: As directed    POST-OPERATIVE OPIOID TAPER INSTRUCTIONS: It is important to wean off of your opioid medication as soon as possible. If you do not need pain medication after your surgery it is ok to stop day one. Opioids include: Codeine, Hydrocodone(Norco, Vicodin), Oxycodone(Percocet, oxycontin) and hydromorphone amongst others.  Long term and even short term use of opiods can cause: Increased pain response Dependence Constipation Depression Respiratory depression And more.  Withdrawal symptoms can include Flu like symptoms Nausea, vomiting And more Techniques to manage these symptoms Hydrate well Eat regular healthy meals Stay active Use relaxation techniques(deep breathing, meditating, yoga) Do Not substitute Alcohol to help with tapering If you have been on opioids for less than two weeks and do not have pain than it is ok to stop all together.  Plan to wean off of opioids This plan should start within one week post op of your joint replacement. Maintain the same interval or time between taking each dose and first decrease the dose.  Cut the total daily intake of opioids by one tablet each day Next start to increase the time between doses. The last dose that should be eliminated is the evening dose.      Post-operative opioid taper instructions:   Complete by: As directed    POST-OPERATIVE OPIOID TAPER INSTRUCTIONS: It is important to wean off of your opioid medication as soon as possible. If you do not need pain medication after your surgery it is ok to stop day one. Opioids include: Codeine, Hydrocodone(Norco, Vicodin), Oxycodone(Percocet, oxycontin) and hydromorphone amongst others.  Long term and even short term use of opiods can  cause: Increased pain response Dependence Constipation Depression Respiratory depression And more.  Withdrawal symptoms  can include Flu like symptoms Nausea, vomiting And more Techniques to manage these symptoms Hydrate well Eat regular healthy meals Stay active Use relaxation techniques(deep breathing, meditating, yoga) Do Not substitute Alcohol to help with tapering If you have been on opioids for less than two weeks and do not have pain than it is ok to stop all together.  Plan to wean off of opioids This plan should start within one week post op of your joint replacement. Maintain the same interval or time between taking each dose and first decrease the dose.  Cut the total daily intake of opioids by one tablet each day Next start to increase the time between doses. The last dose that should be eliminated is the evening dose.          Follow-up Information     Suzan Slick, NP Follow up in 1 week(s).   Specialty: Orthopedic Surgery Contact information: [address] [place] [address] [phone]                  Signed: [name] 01/11/2021, 7:28 AM

## 2021-01-11 NOTE — Plan of Care (Signed)
  Problem: Education: Goal: Knowledge of General Education information will improve Description Including pain rating scale, medication(s)/side effects and non-pharmacologic comfort measures Outcome: Progressing   

## 2021-01-11 NOTE — Progress Notes (Signed)
[name], male   DOB: [birth year], 55 y.o.   MRN: [id number] Patient without complaints this morning.  No drainage to the wound VAC canister.  Discharge planning underway for discharge to skilled nursing.

## 2021-01-11 NOTE — Progress Notes (Signed)
Occupational Therapy Treatment Patient Details Name: Keith [name] MRN: [id number] DOB: 1965/06/14 Today's Date: 01/11/2021   History of present illness Pt adm 9/28 for lt AKA on 01/03/21 due to dehiscence of lt BKA from 10/27/20. PMH - rt BKA, lt BKA, AIDS, CVA, CKD, HTN, DM, CAD   OT comments  Pt is slowly progressing towards OT goals. Pt requiring Min guard for UB bathing and bed mobility. OT suggested transfer to chair in room, however pt declined. Pt did request BUE exercises which OT instructed in. Pt tolerated exercise well. Continues to be appropriate for SNF. Will follow acutely.   Recommendations for follow up therapy are one component of a multi-disciplinary discharge planning process, led by the attending physician.  Recommendations may be updated based on patient status, additional functional criteria and insurance authorization.    Follow Up Recommendations  SNF;Supervision/Assistance - 24 hour    Equipment Recommendations       Recommendations for Other Services      Precautions / Restrictions Precautions Precautions: Fall       Mobility Bed Mobility Overal bed mobility: Needs Assistance Bed Mobility: Supine to Sit;Sit to Supine     Supine to sit: Min guard Sit to supine: Min guard        Transfers                      Balance Overall balance assessment: Needs assistance Sitting-balance support: Single extremity supported Sitting balance-Leahy Scale: Poor Sitting balance - Comments: Tendency to lean/fall towards R side.                                   ADL either performed or assessed with clinical judgement   ADL Overall ADL's : Needs assistance/impaired         Upper Body Bathing: Min guard;Cueing for safety;Bed level                                   Vision       Perception     Praxis      Cognition Arousal/Alertness: Awake/alert Behavior During Therapy: WFL for tasks  assessed/performed Overall Cognitive Status: Within Functional Limits for tasks assessed                                          Exercises General Exercises - Upper Extremity Shoulder Flexion: AROM;Both;10 reps;Seated Elbow Flexion: AROM;Both;10 reps;Seated Elbow Extension: AROM;Both;10 reps;Seated   Shoulder Instructions       General Comments      Pertinent Vitals/ Pain       Pain Assessment: 0-10 Pain Score: 9  Pain Location: LLE Pain Descriptors / Indicators: Constant;Discomfort;Tender;Throbbing Pain Intervention(s): Limited activity within patient's tolerance;Monitored during session  Home Living                                          Prior Functioning/Environment              Frequency  Min 2X/week        Progress Toward Goals  OT Goals(current goals can now be found in the care plan section)  Progress towards OT goals: Progressing toward goals  Acute Rehab OT Goals Patient Stated Goal: reduce pain, SNF OT Goal Formulation: With patient Time For Goal Achievement: 01/19/21 Potential to Achieve Goals: Fair ADL Goals Pt Will Perform Upper Body Bathing: with modified independence;sitting Pt Will Perform Lower Body Bathing: with mod assist;sitting/lateral leans;bed level Pt Will Transfer to Toilet: with mod assist;bedside commode;squat pivot transfer Additional ADL Goal #1: Pt will sit at EOB for 5 mins while engaging in ADL task  Plan Discharge plan remains appropriate;Frequency remains appropriate    Co-evaluation                 AM-PAC OT "6 Clicks" Daily Activity     Outcome Measure   Help from another person eating meals?: None Help from another person taking care of personal grooming?: A Little Help from another person toileting, which includes using toliet, bedpan, or urinal?: A Lot Help from another person bathing (including washing, rinsing, drying)?: A Lot Help from another person to put on and  taking off regular upper body clothing?: A Little Help from another person to put on and taking off regular lower body clothing?: A Lot 6 Click Score: 16    End of Session    OT Visit Diagnosis: Other abnormalities of gait and mobility (R26.89);Muscle weakness (generalized) (M62.81);Pain;Unsteadiness on feet (R26.81) Pain - Right/Left: Left Pain - part of body: Leg   Activity Tolerance Patient tolerated treatment well   Patient Left in bed;with call bell/phone within reach   Nurse Communication Mobility status        Time: [id number] OT Time Calculation (min): 24 min  Charges: OT General Charges $OT Visit: 1 Visit OT Treatments $Self Care/Home Management : 8-22 mins $Therapeutic Exercise: 8-22 mins  Shantika Bermea C, OT/L  Acute Rehab [place] 01/11/2021, 3:08 PM

## 2021-01-12 LAB — GLUCOSE, CAPILLARY
Glucose-Capillary: 103 mg/dL — ABNORMAL HIGH (ref 70–99)
Glucose-Capillary: 113 mg/dL — ABNORMAL HIGH (ref 70–99)
Glucose-Capillary: 99 mg/dL (ref 70–99)
Glucose-Capillary: 106 mg/dL — ABNORMAL HIGH (ref 70–99)

## 2021-01-12 NOTE — Progress Notes (Signed)
[name], male   DOB: 1965/06/21, 55 y.o.   MRN: [name] Patient awake and alert. Awaiting transfer to SNF, call placed to [facility] awaiting call back. States he is having pain in the left AKA.   PE: General : Awake alert in no acute distress. Left AKA : Wound VAC in place . Canister empty.   Plan: SNF placement when bed available  On oxycodone and IV dilaudid for breakthrough pain. Also has Neurontin for nerve pain. Appears comfortable.

## 2021-01-12 NOTE — Progress Notes (Signed)
Physical Therapy Treatment Patient Details Name: Keith [name] MRN: [id number] DOB: 1965-10-15 Today's Date: 01/12/2021   History of Present Illness Pt adm 9/28 for lt AKA on 01/03/21 due to dehiscence of lt BKA from 10/27/20. PMH - rt BKA, lt BKA, AIDS, CVA, CKD, HTN, DM, CAD    PT Comments    Pt. Is excited to trial lateral scoot to chair today.  Able to complete activity with mod independence with PT supervising since it is first trial since surgery.  Pt. Demos improved balance with activity, utilizing UE support to complete transfer.  Interested in trialing standing with prosthetic when pain is better under control.  AM-PAC score indicates SNF for safety, however, if insurance auth for SNF is denied, pt. Is functioning at a level where he could potentially return home with w/c, hospital bed, and assist from family.    Recommendations for follow up therapy are one component of a multi-disciplinary discharge planning process, led by the attending physician.  Recommendations may be updated based on patient status, additional functional criteria and insurance authorization.  Follow Up Recommendations  SNF     Equipment Recommendations       Recommendations for Other Services       Precautions / Restrictions Restrictions Weight Bearing Restrictions: Yes LLE Weight Bearing: Non weight bearing     Mobility  Bed Mobility Overal bed mobility: Modified Independent       Supine to sit: Modified independent (Device/Increase time)     General bed mobility comments: Pt. able to sit up EOB with use of bed rails. Patient Response: Cooperative  Transfers     Transfers: Lateral/Scoot Transfers          Lateral/Scoot Transfers: Modified independent (Device/Increase time) General transfer comment: Completes lateral scoot with supervision only from PT.  States multiple times "don't touch me".  Demos safety with transfer.  Pt. states if he had had pain medicine, he would want to don  prosthetic and trial standing. NSG notified of pt's request for meds.  Ambulation/Gait                 Stairs             Wheelchair Mobility    Modified Rankin (Stroke Patients Only)       Balance   Sitting-balance support: Bilateral upper extremity supported Sitting balance-Leahy Scale: Poor                                      Cognition Arousal/Alertness: Awake/alert Behavior During Therapy: WFL for tasks assessed/performed Overall Cognitive Status: Within Functional Limits for tasks assessed                                 General Comments: PT brings drop arm chair to room and pt. is agreeable to work on transferring [place] today      Exercises      General Comments        Pertinent Vitals/Pain Pain Assessment: 0-10 Pain Score: 10-Worst pain ever Pain Location: Reports 10/10 pain in R LE Pain Descriptors / Indicators: Discomfort;Aching Pain Intervention(s): Patient requesting pain meds-RN notified    Home Living                      Prior Function  PT Goals (current goals can now be found in the care plan section) Progress towards PT goals: Progressing toward goals    Frequency           PT Plan Current plan remains appropriate    Co-evaluation              AM-PAC PT "6 Clicks" Mobility   Outcome Measure  Help needed turning from your back to your side while in a flat bed without using bedrails?: A Little Help needed moving from lying on your back to sitting on the side of a flat bed without using bedrails?: A Lot Help needed moving to and from a bed to a chair (including a wheelchair)?: None Help needed standing up from a chair using your arms (e.g., wheelchair or bedside chair)?: Total Help needed to walk in hospital room?: Total Help needed climbing 3-5 steps with a railing? : Total 6 Click Score: 12    End of Session   Activity Tolerance: Patient tolerated treatment  well Patient left: in chair         Time: 0132-0149 PT Time Calculation (min) (ACUTE ONLY): 17 min  Charges:  $Therapeutic Activity: 8-22 mins                    [name] A. Aziah Brostrom, PT, DPT Acute Rehabilitation Services Office: [place] 01/12/2021, 2:05 PM

## 2021-01-12 NOTE — TOC Progression Note (Addendum)
Transition of Care [facility]) - Initial/Assessment Note    Patient Details  Name: Keith [name] MRN: [id number] Date of Birth: Aug 17, 1965  Transition of Care [facility]) CM/SW Contact:    Milinda Antis, LCSWA Phone Number: 01/12/2021, 10:23 AM  Clinical Narrative:                 10:20-  CSW called Shirlee Limerick with admissions at [facility] to inquire about the status of insurance auth approval.  There was no answer.  CSW left a message requesting a returned call.  11:20-  CSW informed that insurance authorization has not been received.  Expected Discharge Plan: Skilled Nursing Facility Barriers to Discharge: Insurance Authorization   Patient Goals and CMS Choice Patient states their goals for this hospitalization and ongoing recovery are:: SNF CMS Medicare.gov Compare Post Acute Care list provided to:: Patient Choice offered to / list presented to : Patient  Expected Discharge Plan and Services Expected Discharge Plan: [place]       Living arrangements for the past 2 months: Single Family Home Expected Discharge Date: 01/11/21                                    Prior Living Arrangements/Services Living arrangements for the past 2 months: Single Family Home Lives with:: Parents Patient language and need for interpreter reviewed:: Yes Do you feel safe going back to the place where you live?: Yes      Need for Family Participation in Patient Care: Yes (Comment) Care giver support system in place?: Yes (comment)   Criminal Activity/Legal Involvement Pertinent to Current Situation/Hospitalization: No - Comment as needed  Activities of Daily Living Home Assistive Devices/Equipment: Grab bars in shower, Wheelchair, CBG Meter, Shower chair without back ADL Screening (condition at time of admission) Patient's cognitive ability adequate to safely complete daily activities?: Yes Is the patient deaf or have difficulty hearing?: No Does the patient have difficulty  seeing, even when wearing glasses/contacts?: No Does the patient have difficulty concentrating, remembering, or making decisions?: No Patient able to express need for assistance with ADLs?: Yes Does the patient have difficulty dressing or bathing?: No Independently performs ADLs?: Yes (appropriate for developmental age) Does the patient have difficulty walking or climbing stairs?: Yes Weakness of Legs: Both Weakness of Arms/Hands: None  Permission Sought/Granted Permission sought to share information with : Facility Art therapist granted to share information with : Yes, Verbal Permission Granted     Permission granted to share info w AGENCY: SNF        Emotional Assessment Appearance:: Appears stated age Attitude/Demeanor/Rapport: Engaged Affect (typically observed): Appropriate Orientation: : Oriented to Self, Oriented to Place, Oriented to  Time, Oriented to Situation Alcohol / Substance Use: Not Applicable Psych Involvement: No (comment)  Admission diagnosis:  Wound dehiscence [T81.30XA] Patient Active Problem List   Diagnosis Date Noted   Wound dehiscence 01/03/2021   Coagulopathy ([place]) 12/22/2020   CAD (coronary artery disease) 12/22/2020   Obesity 12/22/2020   Gross hematuria 12/20/2020   ABLA (acute blood loss anemia) 12/20/2020   Dehiscence of amputation stump (HCC)    Hx of BKA, left ([place])    Cellulitis of left foot    Acute osteomyelitis of left calcaneus (HCC)    Ulcer of heel due to diabetes mellitus (HCC)    Normochromic normocytic anemia    Cellulitis 10/18/2020   Atypical chest pain 09/29/2020  Acute metabolic encephalopathy [id number]   Hyperglycemia due to diabetes mellitus ([place]) 08/29/2020   Hyperglycemia 08/29/2020   Acute cerebrovascular accident (CVA) due to ischemia [facility]) 04/28/2020   Type 2 diabetes mellitus with hyperlipidemia ([place])    PAD (peripheral artery disease) ([place])    Hypoglycemia 04/25/2020   Acute ST elevation  myocardial infarction (STEMI) due to occlusion of circumflex coronary artery ([place]) 03/22/2020   Uncontrolled type 2 diabetes mellitus with hyperglycemia ([place]) 01/14/2019   Elevated glucose 01/14/2019   Hx of BKA, right ([place]) 01/14/2019   Pressure injury of skin 11/11/2018   STEMI (ST elevation myocardial infarction) ([place]) 11/10/2018   CKD (chronic kidney disease) stage 4, GFR 15-29 ml/min ([place]) 11/10/2018   Amputation stump infection ([place]) 10/28/2018   Type II diabetes mellitus with renal manifestations ([place]) 08/07/2018   Uncontrolled type 2 diabetes mellitus with hyperosmolar nonketotic hyperglycemia ([place]) 08/07/2018   Non-pressure chronic ulcer of right calf limited to breakdown of skin ([place]) 07/06/2018   Type 2 diabetes mellitus without complication, without long-term current use of insulin ([place]) 06/15/2018   Chronic low back pain without sciatica 06/15/2018   Idiopathic chronic venous hypertension of left lower extremity with ulcer and inflammation (HCC)    Venous stasis ulcer of left calf limited to breakdown of skin without varicose veins ([place]) 04/23/2018   Acquired absence of right leg below knee ([place]) 06/13/2017   Acute kidney injury superimposed on CKD ([place]) 03/15/2017   Diabetic polyneuropathy associated with type 2 diabetes mellitus ([place]) 01/23/2017   AKI (acute kidney injury) ([place]) 09/28/2016   Nausea vomiting and diarrhea 09/28/2016   Onychomycosis of multiple toenails with type 2 diabetes mellitus ([place]) 08/29/2015   MRSA carrier 04/20/2014   Penile wart 02/22/2014   HIV disease ([place])    Insulin-requiring or dependent type II diabetes mellitus ([place]) 02/04/2014   Dental anomaly 11/20/2012   Arthritis of right knee 02/23/2012   Hyperlipidemia 11/10/2011   Hyponatremia 11/10/2011   Chronic pain 08/07/2011   Meralgia paraesthetica 04/23/2011   ERECTILE DYSFUNCTION 08/22/2008   Elevated blood pressure reading with diagnosis of hypertension 05/19/2008   PCP:  [name],  NP Pharmacy[facility]   [id number] [place] [place]Phone: [phone]: [phone] [id number] [address]: [phone]: [phone] [address]: [phone]: [phone]   Social Determinants of Health (SDOH) Interventions    Readmission Risk Interventions Readmission Risk Prevention Plan 10/21/2020 11/13/2018 11/13/2018  Transportation Screening Complete - Complete  PCP or Specialist Appt within 3-5 Days - - -  [place] or [place] - -  Medication Review (RPress photographerComplete - Complete  PCP or Specialist appointment within 3-5 days of discharge Complete Complete -  [place] Home Care Consult Complete Complete -  SW Recovery Care/Counseling Consult Complete Complete -  Palliative Care Screening Not Applicable Not Applicable -  Sk[place] Cityomplete Not Applicable -  Some encounter information is confidential and restricted. Go to Review Flowsheets activity to see all data.  Some recent data might be hidden

## 2021-01-13 LAB — GLUCOSE, CAPILLARY
Glucose-Capillary: 92 mg/dL (ref 70–99)
Glucose-Capillary: 90 mg/dL (ref 70–99)
Glucose-Capillary: 118 mg/dL — ABNORMAL HIGH (ref 70–99)
Glucose-Capillary: 99 mg/dL (ref 70–99)

## 2021-01-13 NOTE — TOC Progression Note (Signed)
Transition of Care [facility]) - Progression Note    Patient Details  Name: Keith [name] MRN: [id number] Date of Birth: 05-27-1965  [facility]) CM/SW Contact  Mekai Wilkinson [place], [place] Phone Number:[phone] 01/13/2021, 12:43 PM  Clinical Narrative:    Phone call to [facility] with admissions coordinator Grace-authorization has not come back yet for SNF stay.  Transition of care to continue to follow   [facility], LCSW Transition of Care [phone]    Expected Discharge Plan: [place] Barriers to Discharge: Insurance Authorization  Expected Discharge Plan and Services Expected Discharge Plan: [place]       Living arrangements for the past 2 months: Single Family Home Expected Discharge Date: 01/11/21                                     Social Determinants of Health (SDOH) Interventions    Readmission Risk Interventions Readmission Risk Prevention Plan 10/21/2020 11/13/2018 11/13/2018  Transportation Screening Complete - Complete  PCP or Specialist Appt within 3-5 Days - - -  HRI or [place] for [place] - - -  Medication Review Press photographer) Complete - Complete  PCP or Specialist appointment within 3-5 days of discharge Complete Complete -  [place] or Home Care Consult Complete Complete -  SW Recovery Care/Counseling Consult Complete Complete -  Palliative Care Screening Not Applicable Not Applicable -  [place] Complete Not Applicable -  Some encounter information is confidential and restricted. Go to Review Flowsheets activity to see all data.  Some recent data might be hidden

## 2021-01-14 LAB — GLUCOSE, CAPILLARY
Glucose-Capillary: 103 mg/dL — ABNORMAL HIGH (ref 70–99)
Glucose-Capillary: 90 mg/dL (ref 70–99)
Glucose-Capillary: 94 mg/dL (ref 70–99)
Glucose-Capillary: 97 mg/dL (ref 70–99)

## 2021-01-14 NOTE — Progress Notes (Signed)
Stable. No compliants. Awaiting SNF transfer.

## 2021-01-15 LAB — GLUCOSE, CAPILLARY
Glucose-Capillary: 90 mg/dL (ref 70–99)
Glucose-Capillary: 138 mg/dL — ABNORMAL HIGH (ref 70–99)
Glucose-Capillary: 139 mg/dL — ABNORMAL HIGH (ref 70–99)
Glucose-Capillary: 108 mg/dL — ABNORMAL HIGH (ref 70–99)

## 2021-01-15 MED ORDER — ACETAMINOPHEN 10 MG/ML IV SOLN
INTRAVENOUS | Status: AC
  Filled 2021-01-15: qty 100

## 2021-01-15 NOTE — Progress Notes (Signed)
Occupational Therapy Treatment Patient Details Name: Keith [name] MRN: [name] DOB: Nov 27, 1965 Today's Date: 01/15/2021   History of present illness Pt adm 9/28 for lt AKA on 01/03/21 due to dehiscence of lt BKA from 10/27/20. PMH - rt BKA, lt BKA, AIDS, CVA, CKD, HTN, DM, CAD   OT comments  Pt is slowly progressing towards OT goals. Pt improving activity tolerance, functional transfers, and ADL performance. Pt requiring Mod A with LB dressing (see details below) and modified independent with lateral transfer from bed to drop-arm chair. Pt continues to be appropriate for SNF and is awaiting insurance authorization. Will continue to follow acutely.   Recommendations for follow up therapy are one component of a multi-disciplinary discharge planning process, led by the attending physician.  Recommendations may be updated based on patient status, additional functional criteria and insurance authorization.    Follow Up Recommendations  SNF;Supervision/Assistance - 24 hour    Equipment Recommendations       Recommendations for Other Services      Precautions / Restrictions Precautions Precautions: Fall Precaution Comments: B BKA's: R healed,  L BKA rev 8/27, RLE prosthesis Restrictions Weight Bearing Restrictions: Yes LLE Weight Bearing: Non weight bearing       Mobility Bed Mobility Overal bed mobility: Modified Independent   Rolling: Modified independent (Device/Increase time)              Transfers                Lateral/Scoot Transfers: Modified independent (Device/Increase time) General transfer comment: Pt transfered from bed to drop-arm chair without assistance.    Balance Overall balance assessment: Needs assistance Sitting-balance support: Bilateral upper extremity supported Sitting balance-Leahy Scale: Poor                                     ADL either performed or assessed with clinical judgement   ADL Overall ADL's : Needs  assistance/impaired                     Lower Body Dressing: Moderate assistance;Cueing for compensatory techniques;Sitting/lateral leans Lower Body Dressing Details (indicate cue type and reason): Pt requiring assistance to thread cath through pant leg. Pt able to thread pant legs over BLEs and pull up to hips in sitting position. Requiring assistance and cues for technique to roll side to side and pull pants over hips.                     Vision       Perception     Praxis      Cognition Arousal/Alertness: Awake/alert Behavior During Therapy: WFL for tasks assessed/performed Overall Cognitive Status: Within Functional Limits for tasks assessed                                          Exercises     Shoulder Instructions       General Comments      Pertinent Vitals/ Pain       Pain Assessment: Faces Faces Pain Scale: Hurts even more Pain Location: LLE, head Pain Descriptors / Indicators: Discomfort;Aching Pain Intervention(s): Monitored during session;Repositioned  Home Living  Prior Functioning/Environment              Frequency  Min 2X/week        Progress Toward Goals  OT Goals(current goals can now be found in the care plan section)  Progress towards OT goals: Progressing toward goals  Acute Rehab OT Goals Patient Stated Goal: reduce pain, SNF OT Goal Formulation: With patient Time For Goal Achievement: 01/19/21 Potential to Achieve Goals: Fair ADL Goals Pt Will Perform Upper Body Bathing: with modified independence;sitting Pt Will Perform Lower Body Bathing: with mod assist;sitting/lateral leans;bed level Pt Will Transfer to Toilet: with mod assist;bedside commode;squat pivot transfer Additional ADL Goal #1: Pt will sit at EOB for 5 mins while engaging in ADL task  Plan Discharge plan remains appropriate;Frequency remains appropriate     Co-evaluation                 AM-PAC OT "6 Clicks" Daily Activity     Outcome Measure   Help from another person eating meals?: None Help from another person taking care of personal grooming?: A Little Help from another person toileting, which includes using toliet, bedpan, or urinal?: A Lot Help from another person bathing (including washing, rinsing, drying)?: A Lot Help from another person to put on and taking off regular upper body clothing?: A Little Help from another person to put on and taking off regular lower body clothing?: A Lot 6 Click Score: 16    End of Session    OT Visit Diagnosis: Other abnormalities of gait and mobility (R26.89);Muscle weakness (generalized) (M62.81);Pain;Unsteadiness on feet (R26.81) Pain - Right/Left: Left Pain - part of body: Leg   Activity Tolerance Patient tolerated treatment well   Patient Left in chair;with call bell/phone within reach;with chair alarm set   Nurse Communication Mobility status        Time: [id number] OT Time Calculation (min): 20 min  Charges: OT General Charges $OT Visit: 1 Visit OT Treatments $Self Care/Home Management : 8-22 mins  Joellen Tullos C, OT/L  Acute Rehab [place] 01/15/2021, 12:10 PM

## 2021-01-15 NOTE — Progress Notes (Signed)
PT Cancellation Note  Patient Details Name: Keith [name] MRN: [name] DOB: 10/30/1965   Cancelled Treatment:    Reason Eval/Treat Not Completed: Patient declined, no reason specified (Pt. declines therapy at this time.  States he has a really bad HA.  NSG contacted for meds.)  Merlyn Conley A. Tiffny Gemmer, PT, DPT Acute Rehabilitation Services Office: [place] 01/15/2021, 10:11 AM

## 2021-01-15 NOTE — TOC Progression Note (Addendum)
Transition of Care [facility]) - Progression Note    Patient Details  Name: Keith [name] MRN: [name] Date of Birth: Mar 13, 1966  Transition of Care [facility]) CM/SW Contact  Emeterio Reeve, [place] Phone Number: 01/15/2021, 11:21 AM  Clinical Narrative:     Insurance Josem Kaufmann is still pending. CSW will continue to monitor.   2:00 [place] requested additional clinicals. CSW sent over via hub.   Expected Discharge Plan: Skilled Nursing Facility Barriers to Discharge: Insurance Authorization  Expected Discharge Plan and Services Expected Discharge Plan: [place]       Living arrangements for the past 2 months: Single Family Home Expected Discharge Date: 01/11/21                                     Social Determinants of Health (SDOH) Interventions    Readmission Risk Interventions Readmission Risk Prevention Plan 10/21/2020 11/13/2018 11/13/2018  Transportation Screening Complete - Complete  PCP or Specialist Appt within 3-5 Days - - -  [place] or [place] for [place] - - -  Medication Review Press photographer) Complete - Complete  PCP or Specialist appointment within 3-5 days of discharge Complete Complete -  Mobile or Home Care Consult Complete Complete -  SW Recovery Care/Counseling Consult Complete Complete -  Palliative Care Screening Not Applicable Not Applicable -  [place] Complete Not Applicable -  Some encounter information is confidential and restricted. Go to Review Flowsheets activity to see all data.  Some recent data might be hidden   Emeterio Reeve, LCSW Clinical Social Worker

## 2021-01-15 NOTE — Progress Notes (Signed)
Nutrition Follow-up  DOCUMENTATION CODES:   Obesity unspecified  INTERVENTION:   -Continue MVI with minerals daily -Continue Magic cup TID with meals, each supplement provides 290 kcal and 9 grams of protein  -RD will sign off due to medical stability; if further nutrition-related needs arise, please re-consult RD  NUTRITION DIAGNOSIS:   Increased nutrient needs related to post-op healing as evidenced by estimated needs.  Ongoing  GOAL:   Patient will meet greater than or equal to 90% of their needs  Progressing   MONITOR:   PO intake, Supplement acceptance, Labs, Weight trends, Skin, I & O's  REASON FOR ASSESSMENT:   Other (Comment)    ASSESSMENT:   Patient is a 55 year old gentleman who is status post a right transtibial amputation and uses a prosthesis on the right.  He is most recently status post revision of a left transtibial amputation.  He is 4 weeks out from his index surgery.  Patient states that he messed up and has been having wound healing problems.  Patient is at home.  9/28- s/p lt AKA with wound vac placement  Reviewed I/O's: -500 ml x 24 hours and -3.1 L since admission  UOP: 500 ml x 24 hours  Pt unavailable at time of visit. Attempted to speak with pt via call to hospital room phone, however, unable to reach.   Pt remains with good appetite. Noted meal completions 25-100%.   Medications reviewed and include vitamin C, colace, and tivicay.  Pt medically stable for discharge. Per TOC notes, pt is awaiting insurance authorization for SNF placement.   Labs reviewed: CBGS: 90-103 (inpatient orders for glycemic control are 0-15 units inuslin aspart TID with meals).    Diet Order:   Diet Order             Diet - low sodium heart healthy           Diet - low sodium heart healthy           Diet Carb Modified Fluid consistency: Thin; Room service appropriate? Yes  Diet effective now                   EDUCATION NEEDS:   No education  needs have been identified at this time  Skin:  Skin Assessment: Skin Integrity Issues: Skin Integrity Issues:: Other (Comment) Wound Vac: s/p lt AKA Incisions: - Other: MASD to scrotum  Last BM:  01/04/21  Height:   Ht Readings from Last 1 Encounters:  01/03/21 '5\' 9"'$  (1.753 m)    Weight:   Wt Readings from Last 1 Encounters:  01/03/21 120.2 kg    Ideal Body Weight:  62.2 kg (adjusted for lt AKA and rt BKA)  BMI:  Body mass index is 39.13 kg/m.  Estimated Nutritional Needs:   Kcal:  2000-2200  Protein:  110-125 grams  Fluid:  > 2 L    Loistine Chance, RD, LDN, [place] Registered Dietitian II Certified Diabetes Care and Education Specialist Please refer to [facility] for RD and/or RD on-call/weekend/after hours pager

## 2021-01-15 NOTE — Progress Notes (Signed)
[name], male   DOB: 06-Aug-1965, 55 y.o.   MRN: [name] No drainage in the wound VAC canister still awaiting approval for discharge to skilled nursing.  Will have the wound VAC dressing removed today and apply the stump shrinker.

## 2021-01-16 DIAGNOSIS — Z0000 Encounter for general adult medical examination without abnormal findings: Secondary | ICD-10-CM | POA: Diagnosis not present

## 2021-01-16 DIAGNOSIS — Z23 Encounter for immunization: Secondary | ICD-10-CM | POA: Diagnosis not present

## 2021-01-16 DIAGNOSIS — R531 Weakness: Secondary | ICD-10-CM | POA: Diagnosis not present

## 2021-01-16 DIAGNOSIS — E7800 Pure hypercholesterolemia, unspecified: Secondary | ICD-10-CM | POA: Diagnosis not present

## 2021-01-16 DIAGNOSIS — Z79899 Other long term (current) drug therapy: Secondary | ICD-10-CM | POA: Diagnosis not present

## 2021-01-16 DIAGNOSIS — I160 Hypertensive urgency: Secondary | ICD-10-CM | POA: Diagnosis not present

## 2021-01-16 DIAGNOSIS — Z89512 Acquired absence of left leg below knee: Secondary | ICD-10-CM | POA: Diagnosis not present

## 2021-01-16 DIAGNOSIS — N3281 Overactive bladder: Secondary | ICD-10-CM | POA: Diagnosis not present

## 2021-01-16 DIAGNOSIS — N184 Chronic kidney disease, stage 4 (severe): Secondary | ICD-10-CM | POA: Diagnosis not present

## 2021-01-16 DIAGNOSIS — I6930 Unspecified sequelae of cerebral infarction: Secondary | ICD-10-CM | POA: Diagnosis not present

## 2021-01-16 DIAGNOSIS — E1165 Type 2 diabetes mellitus with hyperglycemia: Secondary | ICD-10-CM | POA: Diagnosis not present

## 2021-01-16 DIAGNOSIS — D689 Coagulation defect, unspecified: Secondary | ICD-10-CM | POA: Diagnosis not present

## 2021-01-16 DIAGNOSIS — M6281 Muscle weakness (generalized): Secondary | ICD-10-CM | POA: Diagnosis not present

## 2021-01-16 DIAGNOSIS — R2689 Other abnormalities of gait and mobility: Secondary | ICD-10-CM | POA: Diagnosis not present

## 2021-01-16 DIAGNOSIS — N183 Chronic kidney disease, stage 3 unspecified: Secondary | ICD-10-CM | POA: Diagnosis not present

## 2021-01-16 DIAGNOSIS — E876 Hypokalemia: Secondary | ICD-10-CM | POA: Diagnosis not present

## 2021-01-16 DIAGNOSIS — I69354 Hemiplegia and hemiparesis following cerebral infarction affecting left non-dominant side: Secondary | ICD-10-CM | POA: Diagnosis not present

## 2021-01-16 DIAGNOSIS — A419 Sepsis, unspecified organism: Secondary | ICD-10-CM | POA: Diagnosis not present

## 2021-01-16 DIAGNOSIS — G546 Phantom limb syndrome with pain: Secondary | ICD-10-CM | POA: Diagnosis not present

## 2021-01-16 DIAGNOSIS — I739 Peripheral vascular disease, unspecified: Secondary | ICD-10-CM | POA: Diagnosis not present

## 2021-01-16 DIAGNOSIS — I129 Hypertensive chronic kidney disease with stage 1 through stage 4 chronic kidney disease, or unspecified chronic kidney disease: Secondary | ICD-10-CM | POA: Diagnosis not present

## 2021-01-16 DIAGNOSIS — D649 Anemia, unspecified: Secondary | ICD-10-CM | POA: Diagnosis not present

## 2021-01-16 DIAGNOSIS — R262 Difficulty in walking, not elsewhere classified: Secondary | ICD-10-CM | POA: Diagnosis not present

## 2021-01-16 DIAGNOSIS — G8929 Other chronic pain: Secondary | ICD-10-CM | POA: Diagnosis not present

## 2021-01-16 DIAGNOSIS — T8131XA Disruption of external operation (surgical) wound, not elsewhere classified, initial encounter: Secondary | ICD-10-CM | POA: Diagnosis not present

## 2021-01-16 DIAGNOSIS — R030 Elevated blood-pressure reading, without diagnosis of hypertension: Secondary | ICD-10-CM | POA: Diagnosis not present

## 2021-01-16 DIAGNOSIS — E1129 Type 2 diabetes mellitus with other diabetic kidney complication: Secondary | ICD-10-CM | POA: Diagnosis not present

## 2021-01-16 DIAGNOSIS — R69 Illness, unspecified: Secondary | ICD-10-CM | POA: Diagnosis not present

## 2021-01-16 DIAGNOSIS — I10 Essential (primary) hypertension: Secondary | ICD-10-CM | POA: Diagnosis not present

## 2021-01-16 DIAGNOSIS — B20 Human immunodeficiency virus [HIV] disease: Secondary | ICD-10-CM | POA: Diagnosis not present

## 2021-01-16 DIAGNOSIS — Z6841 Body Mass Index (BMI) 40.0 and over, adult: Secondary | ICD-10-CM | POA: Diagnosis not present

## 2021-01-16 DIAGNOSIS — F419 Anxiety disorder, unspecified: Secondary | ICD-10-CM | POA: Diagnosis not present

## 2021-01-16 DIAGNOSIS — D631 Anemia in chronic kidney disease: Secondary | ICD-10-CM | POA: Diagnosis not present

## 2021-01-16 DIAGNOSIS — E559 Vitamin D deficiency, unspecified: Secondary | ICD-10-CM | POA: Diagnosis not present

## 2021-01-16 DIAGNOSIS — R197 Diarrhea, unspecified: Secondary | ICD-10-CM | POA: Diagnosis not present

## 2021-01-16 DIAGNOSIS — I2510 Atherosclerotic heart disease of native coronary artery without angina pectoris: Secondary | ICD-10-CM | POA: Diagnosis not present

## 2021-01-16 DIAGNOSIS — E119 Type 2 diabetes mellitus without complications: Secondary | ICD-10-CM | POA: Diagnosis not present

## 2021-01-16 DIAGNOSIS — E162 Hypoglycemia, unspecified: Secondary | ICD-10-CM | POA: Diagnosis not present

## 2021-01-16 DIAGNOSIS — Z7401 Bed confinement status: Secondary | ICD-10-CM | POA: Diagnosis not present

## 2021-01-16 DIAGNOSIS — I639 Cerebral infarction, unspecified: Secondary | ICD-10-CM | POA: Diagnosis not present

## 2021-01-16 DIAGNOSIS — Z89611 Acquired absence of right leg above knee: Secondary | ICD-10-CM | POA: Diagnosis not present

## 2021-01-16 DIAGNOSIS — Z743 Need for continuous supervision: Secondary | ICD-10-CM | POA: Diagnosis not present

## 2021-01-16 DIAGNOSIS — S78112A Complete traumatic amputation at level between left hip and knee, initial encounter: Secondary | ICD-10-CM | POA: Diagnosis not present

## 2021-01-16 DIAGNOSIS — E669 Obesity, unspecified: Secondary | ICD-10-CM | POA: Diagnosis not present

## 2021-01-16 DIAGNOSIS — E118 Type 2 diabetes mellitus with unspecified complications: Secondary | ICD-10-CM | POA: Diagnosis not present

## 2021-01-16 LAB — GLUCOSE, CAPILLARY
Glucose-Capillary: 110 mg/dL — ABNORMAL HIGH (ref 70–99)
Glucose-Capillary: 117 mg/dL — ABNORMAL HIGH (ref 70–99)
Glucose-Capillary: 112 mg/dL — ABNORMAL HIGH (ref 70–99)
Glucose-Capillary: 122 mg/dL — ABNORMAL HIGH (ref 70–99)

## 2021-01-16 LAB — SARS CORONAVIRUS 2 (TAT 6-24 HRS): SARS Coronavirus 2: NEGATIVE

## 2021-01-16 NOTE — TOC Transition Note (Signed)
Transition of Care [facility]) - CM/SW Discharge Note   Patient Details  Name: Keith [name] MRN: [name] Date of Birth: 1965-08-21  Transition of Care [facility]) CM/SW Contact:  Emeterio Reeve, LCSW Phone Number: 01/16/2021, 12:20 PM   Clinical Narrative:     Patient will DC to: [place] Anticipated DC date: 01/16/21 Family notified: none Transport by: Corey Harold     Per MD patient ready for DC to [facility]. RN, patient, patient's family, and facility notified of DC. Discharge Summary and FL2 sent to facility. DC packet on chart. Insurance Josem Kaufmann has been received and pt is covid negative. Ambulance transport requested for patient.    RN to call report to [phone].  CSW will sign off for now as social work intervention is no longer needed. Please consult [place] again if new needs arise.   Final next level of care: Skilled Nursing Facility Barriers to Discharge: Barriers Resolved   Patient Goals and CMS Choice Patient states their goals for this hospitalization and ongoing recovery are:: SNF CMS Medicare.gov Compare Post Acute Care list provided to:: Patient Choice offered to / list presented to : Patient  Discharge Placement              Patient chooses bed at: Other - please specify in the comment section below: [facility]) Patient to be transferred to facility by: Ptar Name of family member notified: Pt oriented x4 Patient and family notified of of transfer: 01/16/21  Discharge Plan and Services                                     Social Determinants of Health ([place]) Interventions     Readmission Risk Interventions Readmission Risk Prevention Plan 10/21/2020 11/13/2018 11/13/2018  Transportation Screening Complete - Complete  PCP or Specialist Appt within 3-5 Days - - -  HRI or [place] Work Consult for [place] - - -  Medication Review Press photographer) Complete -  Complete  PCP or Specialist appointment within 3-5 days of discharge Complete Complete -  [place] or Home Care Consult Complete Complete -  SW Recovery Care/Counseling Consult Complete Complete -  Palliative Care Screening Not Applicable Not Applicable -  [place] Complete Not Applicable -  Some encounter information is confidential and restricted. Go to Review Flowsheets activity to see all data.  Some recent data might be hidden     Emeterio Reeve, LCSW Clinical Social Worker

## 2021-01-16 NOTE — Progress Notes (Signed)
[name], male   DOB: [birth year], 55 y.o.   MRN: [name] Patient's wound VAC is off he is resting comfortably he has no complaints.  Plan for discharge to skilled nursing.  The COVID test was reordered.

## 2021-01-16 NOTE — Progress Notes (Signed)
Patient still awaiting PTAR transport to [facility]. At this time IV catheter remains in place. Report called to facility and given to [facility] this afternoon. Patient is aware that he will be transported tonight. RN aware that IV is still in place and foley catheter to remain.

## 2021-01-16 NOTE — Progress Notes (Addendum)
[name] to be D/C'd  per MD order.    IV catheter discontinued intact. Site without signs and symptoms of complications. Dressing and pressure applied.  An After Visit Summary was printed and given to PTAR. Patient received prescription.  D/c education completed with patient and receiving facility including follow up instructions, medication list, d/c activities limitations if indicated, with other d/c instructions as indicated by MD - patient able to verbalize understanding, all questions fully answered.   Patient to be escorted via PTAR and D/C to [facility].

## 2021-01-16 NOTE — Discharge Summary (Signed)
Discharge Diagnoses:  Active Problems:   Wound dehiscence   Surgeries: Procedure(s): LEFT ABOVE KNEE AMPUTATION on 01/03/2021    Consultants:   Discharged Condition: Improved  Hospital Course: Keith [name] is an 56 y.o. male who was admitted 01/03/2021 with a chief complaint of dehiscence left below-knee amputation, with a final diagnosis of Dehiscence Left Below Knee Amputation.  Patient was brought to the operating room on 01/03/2021 and underwent Procedure(s): LEFT ABOVE KNEE AMPUTATION.    Patient was given perioperative antibiotics:  Anti-infectives (From admission, onward)    Start     Dose/Rate Route Frequency Ordered Stop   01/05/21 1200  dapsone tablet 100 mg        100 mg Oral Daily 01/05/21 1106     01/04/21 1000  doravirine (PIFELTRO) tablet 100 mg        100 mg Oral Daily 01/03/21 1503     01/04/21 1000  dolutegravir (TIVICAY) tablet 50 mg       See Hyperspace for full Linked Orders Report.   50 mg Oral Daily 01/03/21 1511     01/04/21 1000  lamiVUDine (EPIVIR) tablet 300 mg       See Hyperspace for full Linked Orders Report.   300 mg Oral Daily 01/03/21 1511     01/03/21 2000  ceFAZolin (ANCEF) IVPB 2g/100 mL premix        2 g 200 mL/hr over 30 Minutes Intravenous Every 8 hours 01/03/21 1503 01/04/21 0340   01/03/21 1600  Dolutegravir-lamiVUDine 50-300 MG TABS 1 tablet  Status:  Discontinued        1 tablet Oral Daily 01/03/21 1503 01/03/21 1510   01/03/21 0845  ceFAZolin (ANCEF) IVPB 3g/100 mL premix  Status:  Discontinued        3 g 200 mL/hr over 30 Minutes Intravenous On call to O.R. 01/03/21 3846 01/03/21 1326     .  Patient was given sequential compression devices, early ambulation, and aspirin for DVT prophylaxis.  Recent vital signs: Patient Vitals for the past 24 hrs:  BP Temp Temp src Pulse Resp SpO2  01/16/21 0744 (!) 140/94 98.2 F (36.8 C) Oral 71 17 99 %  01/16/21 0500 123/80 98.3 F (36.8 C) Oral 73 19 96 %  01/15/21 1957 115/90 98.5 F  (36.9 C) Oral 77 19 98 %  01/15/21 1620 132/77 97.9 F (36.6 C) Oral 70 16 97 %  .  Recent laboratory studies: No results found.  Discharge Medications:   Allergies as of 01/16/2021       Reactions   Elemental Sulfur Itching   Patient stated he's allergic to "sulfur" AND "sulfa"   Sulfa Antibiotics Itching        Medication List     STOP taking these medications    Oxycodone HCl 10 MG Tabs       TAKE these medications    acetaminophen 325 MG tablet Commonly known as: TYLENOL Take 2 tablets (650 mg total) by mouth every 4 (four) hours as needed for mild pain, fever or headache.   alfuzosin 10 MG 24 hr tablet Commonly known as: UROXATRAL Take 1 tablet (10 mg total) by mouth daily with breakfast.   allopurinol 100 MG tablet Commonly known as: ZYLOPRIM Take 1 tablet (100 mg total) by mouth daily.   ALPRAZolam 1 MG tablet Commonly known as: XANAX Take 1 tablet (1 mg total) by mouth daily as needed for anxiety.   aspirin 81 MG EC tablet Take 1 tablet (81 mg  total) by mouth daily.   atorvastatin 80 MG tablet Commonly known as: LIPITOR Take 1 tablet (80 mg total) by mouth daily.   blood glucose meter kit and supplies Dispense based on patient and insurance preference. Use up to four times daily as directed. (FOR ICD-10 E10.9, E11.9).   carvedilol 3.125 MG tablet Commonly known as: Coreg Take 1 tablet (3.125 mg total) by mouth 2 (two) times daily.   Cool Blood Glucose Test Strips test strip Generic drug: glucose blood Use as instructed   dapsone 100 MG tablet TAKE 1 TABLET (100 MG TOTAL) BY MOUTH DAILY. What changed: how much to take   Dovato 50-300 MG Tabs Generic drug: Dolutegravir-lamiVUDine Take 1 tablet by mouth daily.   escitalopram 20 MG tablet Commonly known as: LEXAPRO Take 20 mg by mouth daily.   FeroSul 325 (65 FE) MG tablet Generic drug: ferrous sulfate Take 1 tablet (325 mg total) by mouth every Monday, Wednesday, and Friday at 6 PM.  Do not take with other medicines.   finasteride 5 MG tablet Commonly known as: PROSCAR Take 1 tablet (5 mg total) by mouth daily.   gabapentin 100 MG capsule Commonly known as: NEURONTIN Take 1 capsule (100 mg total) by mouth 3 (three) times daily.   nitroGLYCERIN 0.4 MG SL tablet Commonly known as: NITROSTAT PLACE 1 TABLET (0.4 MG TOTAL) UNDER THE TONGUE EVERY FIVE MINUTES X 3 DOSES AS NEEDED FOR CHEST PAIN. What changed: how much to take   onetouch ultrasoft lancets Use as instructed   oxybutynin 5 MG tablet Commonly known as: DITROPAN Take 1/2 tablet (2.5 mg total) by mouth 2 (two) times daily.   oxyCODONE-acetaminophen 10-325 MG tablet Commonly known as: PERCOCET Take 1 tablet by mouth every 4 (four) hours as needed for pain.   pantoprazole 40 MG tablet Commonly known as: PROTONIX Take 1 tablet (40 mg total) by mouth daily.   Pifeltro 100 MG Tabs tablet Generic drug: doravirine Take 1 tablet (100 mg total) by mouth daily.   traZODone 100 MG tablet Commonly known as: DESYREL Take 100 mg by mouth at bedtime.   Vascepa 1 g capsule Generic drug: icosapent Ethyl TAKE 2 CAPSULES (2 G TOTAL) BY MOUTH TWO TIMES DAILY. What changed:  how much to take how to take this when to take this        Diagnostic Studies: US RENAL  Result Date: 12/18/2020 CLINICAL DATA:  Acute renal insufficiency EXAM: RENAL / URINARY TRACT ULTRASOUND COMPLETE COMPARISON:  12/11/2020 FINDINGS: Right Kidney: Renal measurements: 11.7 x 6.0 x 4.8 cm = volume: 175 mL. Renal cortical echogenicity is diffusely mildly increased, in keeping with changes of underlying medical renal disease, unchanged from prior examination. Cortical thickness is preserved. No hydronephrosis. No intrarenal masses or calcifications are seen. Left Kidney: Renal measurements: 11.4 x 5.1 x 5.3 cm = volume: 160 mL. Similarly, renal cortical echogenicity is mildly increased, similar to prior examination, in keeping with  underlying medical renal disease. Cortical thickness is preserved. No hydronephrosis. No intrarenal masses or calcifications are seen. Bladder: Foley catheter balloon is seen within a decompressed bladder lumen. Other: None. IMPRESSION: Stable mild cortical hyperechogenicity in keeping with underlying medical renal disease. No hydronephrosis. Electronically Signed   By: Fidela Salisbury M.D.   On: 12/18/2020 16:07    Patient benefited maximally from their hospital stay and there were no complications.     Disposition: Discharge disposition: 03-Skilled Nursing Facility      Discharge Instructions     Call  MD / Call 911   Complete by: As directed    If you experience chest pain or shortness of breath, CALL 911 and be transported to the hospital emergency room.  If you develope a fever above 101 F, pus (white drainage) or increased drainage or redness at the wound, or calf pain, call your surgeon's office.   Call MD / Call 911   Complete by: As directed    If you experience chest pain or shortness of breath, CALL 911 and be transported to the hospital emergency room.  If you develope a fever above 101 F, pus (white drainage) or increased drainage or redness at the wound, or calf pain, call your surgeon's office.   Call MD / Call 911   Complete by: As directed    If you experience chest pain or shortness of breath, CALL 911 and be transported to the hospital emergency room.  If you develope a fever above 101 F, pus (white drainage) or increased drainage or redness at the wound, or calf pain, call your surgeon's office.   Constipation Prevention   Complete by: As directed    Drink plenty of fluids.  Prune juice may be helpful.  You may use a stool softener, such as Colace (over the counter) 100 mg twice a day.  Use MiraLax (over the counter) for constipation as needed.   Constipation Prevention   Complete by: As directed    Drink plenty of fluids.  Prune juice may be helpful.  You may use a stool  softener, such as Colace (over the counter) 100 mg twice a day.  Use MiraLax (over the counter) for constipation as needed.   Constipation Prevention   Complete by: As directed    Drink plenty of fluids.  Prune juice may be helpful.  You may use a stool softener, such as Colace (over the counter) 100 mg twice a day.  Use MiraLax (over the counter) for constipation as needed.   Diet - low sodium heart healthy   Complete by: As directed    Diet - low sodium heart healthy   Complete by: As directed    Diet - low sodium heart healthy   Complete by: As directed    Increase activity slowly as tolerated   Complete by: As directed    Increase activity slowly as tolerated   Complete by: As directed    Increase activity slowly as tolerated   Complete by: As directed    Negative Pressure Wound Therapy - Incisional   Complete by: As directed    Show patient how to attach preveena vac. Call office if alarms   Post-operative opioid taper instructions:   Complete by: As directed    POST-OPERATIVE OPIOID TAPER INSTRUCTIONS: It is important to wean off of your opioid medication as soon as possible. If you do not need pain medication after your surgery it is ok to stop day one. Opioids include: Codeine, Hydrocodone(Norco, Vicodin), Oxycodone(Percocet, oxycontin) and hydromorphone amongst others.  Long term and even short term use of opiods can cause: Increased pain response Dependence Constipation Depression Respiratory depression And more.  Withdrawal symptoms can include Flu like symptoms Nausea, vomiting And more Techniques to manage these symptoms Hydrate well Eat regular healthy meals Stay active Use relaxation techniques(deep breathing, meditating, yoga) Do Not substitute Alcohol to help with tapering If you have been on opioids for less than two weeks and do not have pain than it is ok to stop all together.  Plan  to wean off of opioids This plan should start within one week post op  of your joint replacement. Maintain the same interval or time between taking each dose and first decrease the dose.  Cut the total daily intake of opioids by one tablet each day Next start to increase the time between doses. The last dose that should be eliminated is the evening dose.      Post-operative opioid taper instructions:   Complete by: As directed    POST-OPERATIVE OPIOID TAPER INSTRUCTIONS: It is important to wean off of your opioid medication as soon as possible. If you do not need pain medication after your surgery it is ok to stop day one. Opioids include: Codeine, Hydrocodone(Norco, Vicodin), Oxycodone(Percocet, oxycontin) and hydromorphone amongst others.  Long term and even short term use of opiods can cause: Increased pain response Dependence Constipation Depression Respiratory depression And more.  Withdrawal symptoms can include Flu like symptoms Nausea, vomiting And more Techniques to manage these symptoms Hydrate well Eat regular healthy meals Stay active Use relaxation techniques(deep breathing, meditating, yoga) Do Not substitute Alcohol to help with tapering If you have been on opioids for less than two weeks and do not have pain than it is ok to stop all together.  Plan to wean off of opioids This plan should start within one week post op of your joint replacement. Maintain the same interval or time between taking each dose and first decrease the dose.  Cut the total daily intake of opioids by one tablet each day Next start to increase the time between doses. The last dose that should be eliminated is the evening dose.      Post-operative opioid taper instructions:   Complete by: As directed    POST-OPERATIVE OPIOID TAPER INSTRUCTIONS: It is important to wean off of your opioid medication as soon as possible. If you do not need pain medication after your surgery it is ok to stop day one. Opioids include: Codeine, Hydrocodone(Norco, Vicodin),  Oxycodone(Percocet, oxycontin) and hydromorphone amongst others.  Long term and even short term use of opiods can cause: Increased pain response Dependence Constipation Depression Respiratory depression And more.  Withdrawal symptoms can include Flu like symptoms Nausea, vomiting And more Techniques to manage these symptoms Hydrate well Eat regular healthy meals Stay active Use relaxation techniques(deep breathing, meditating, yoga) Do Not substitute Alcohol to help with tapering If you have been on opioids for less than two weeks and do not have pain than it is ok to stop all together.  Plan to wean off of opioids This plan should start within one week post op of your joint replacement. Maintain the same interval or time between taking each dose and first decrease the dose.  Cut the total daily intake of opioids by one tablet each day Next start to increase the time between doses. The last dose that should be eliminated is the evening dose.          Follow-up Information     Suzan Slick, NP Follow up in 1 week(s).   Specialty: Orthopedic Surgery Contact information: [address] [place] [address] [phone]                  Signed: [name] 01/16/2021, 12:14 PM

## 2021-01-16 NOTE — TOC Progression Note (Signed)
Transition of Care [facility]) - Progression Note    Patient Details  Name: Keith [name] MRN: [name] Date of Birth: Dec 25, 1965  Transition [facility]) CM/SW Contact  Emeterio Reeve, [place] Phone Number: 01/16/2021, 11:18 AM  Clinical Narrative:     [name] is requesting a peer to peer with the MD/ PA. The number to call is [phone] option 3. Pts subcriber number is [id number]. The peer to peer expires today at [place]. MD and PA made aware via secure chat.   Pt also has Medicaid.  Pt in the past has been unwilling to sign over check to facility while he is there. CSW will discuss with pt.   Expected Discharge Plan: Skilled Nursing Facility Barriers to Discharge: Insurance Authorization  Expected Discharge Plan and Services Expected Discharge Plan: [place]       Living arrangements for the past 2 months: Single Family Home Expected Discharge Date: 01/11/21                                     Social Determinants of Health (SDOH) Interventions    Readmission Risk Interventions Readmission Risk Prevention Plan 10/21/2020 11/13/2018 11/13/2018  Transportation Screening Complete - Complete  PCP or Specialist Appt within 3-5 Days - - -  HRI or [place] for [place] - - -  Medication Review Press photographer) Complete - Complete  PCP or Specialist appointment within 3-5 days of discharge Complete Complete -  [place] or Home Care Consult Complete Complete -  SW Recovery Care/Counseling Consult Complete Complete -  Palliative Care Screening Not Applicable Not Applicable -  [place] Complete Not Applicable -  Some encounter information is confidential and restricted. Go to Review Flowsheets activity to see all data.  Some recent data might be hidden   Emeterio Reeve, LCSW Clinical Social Worker

## 2021-01-17 DIAGNOSIS — E119 Type 2 diabetes mellitus without complications: Secondary | ICD-10-CM | POA: Diagnosis not present

## 2021-01-17 DIAGNOSIS — R69 Illness, unspecified: Secondary | ICD-10-CM | POA: Diagnosis not present

## 2021-01-17 DIAGNOSIS — G8929 Other chronic pain: Secondary | ICD-10-CM | POA: Diagnosis not present

## 2021-01-17 DIAGNOSIS — R197 Diarrhea, unspecified: Secondary | ICD-10-CM | POA: Diagnosis not present

## 2021-01-18 DIAGNOSIS — E119 Type 2 diabetes mellitus without complications: Secondary | ICD-10-CM | POA: Diagnosis not present

## 2021-01-18 DIAGNOSIS — R69 Illness, unspecified: Secondary | ICD-10-CM | POA: Diagnosis not present

## 2021-01-18 DIAGNOSIS — N3281 Overactive bladder: Secondary | ICD-10-CM | POA: Diagnosis not present

## 2021-01-18 DIAGNOSIS — N184 Chronic kidney disease, stage 4 (severe): Secondary | ICD-10-CM | POA: Diagnosis not present

## 2021-01-19 ENCOUNTER — Other Ambulatory Visit: Payer: Self-pay | Admitting: *Deleted

## 2021-01-19 NOTE — Patient Outreach (Signed)
[facility]) Care Management  01/19/2021  Keith [name] 1965/08/07 [name]   [facility] Care coordination-  [facility] multidisciplinary care discussion (MCD)   [facility] MCD team was sent a template report   [facility] RN CM reviewed Epic notes [facility] RN CM reviewed and provided an update on patient status and progression to the [facility] MCD team [facility] RN CM answered questions for MCD team  Recommendations - monitor for possible skilled nursing facility (snf) discharge and case closure per workflow   Plan [facility] RN CM follow recommendations   Roza Creamer L. [name], RN, BSN, [place] Coordinator Office number [phone] Mobile number [phone]  Main [facility] number [phone] Fax number [phone]

## 2021-01-23 DIAGNOSIS — E559 Vitamin D deficiency, unspecified: Secondary | ICD-10-CM | POA: Diagnosis not present

## 2021-01-23 DIAGNOSIS — E1129 Type 2 diabetes mellitus with other diabetic kidney complication: Secondary | ICD-10-CM | POA: Diagnosis not present

## 2021-01-23 DIAGNOSIS — I10 Essential (primary) hypertension: Secondary | ICD-10-CM | POA: Diagnosis not present

## 2021-01-23 DIAGNOSIS — E1165 Type 2 diabetes mellitus with hyperglycemia: Secondary | ICD-10-CM | POA: Diagnosis not present

## 2021-01-23 DIAGNOSIS — Z23 Encounter for immunization: Secondary | ICD-10-CM | POA: Diagnosis not present

## 2021-01-23 DIAGNOSIS — E7800 Pure hypercholesterolemia, unspecified: Secondary | ICD-10-CM | POA: Diagnosis not present

## 2021-01-23 DIAGNOSIS — Z79899 Other long term (current) drug therapy: Secondary | ICD-10-CM | POA: Diagnosis not present

## 2021-01-24 ENCOUNTER — Ambulatory Visit (INDEPENDENT_AMBULATORY_CARE_PROVIDER_SITE_OTHER): Payer: Medicare HMO | Admitting: Family

## 2021-01-24 ENCOUNTER — Encounter: Payer: Self-pay | Admitting: Family

## 2021-01-24 DIAGNOSIS — D631 Anemia in chronic kidney disease: Secondary | ICD-10-CM | POA: Diagnosis not present

## 2021-01-24 DIAGNOSIS — Z89612 Acquired absence of left leg above knee: Secondary | ICD-10-CM

## 2021-01-24 DIAGNOSIS — I10 Essential (primary) hypertension: Secondary | ICD-10-CM | POA: Diagnosis not present

## 2021-01-24 DIAGNOSIS — E876 Hypokalemia: Secondary | ICD-10-CM | POA: Diagnosis not present

## 2021-01-24 DIAGNOSIS — E119 Type 2 diabetes mellitus without complications: Secondary | ICD-10-CM | POA: Diagnosis not present

## 2021-01-24 NOTE — Progress Notes (Signed)
Post-Op Visit Note   Patient: DUAYNE HERNANDEZGARCI           Date of Birth: 11/02/1965           MRN: [name] Visit Date: 01/24/2021 [name], NP  Chief Complaint: No chief complaint on file.   HPI:  HPI The patient is a 55 year old gentleman seen status post left above-knee amputation.  He had he is in a wheelchair.  He does have a below-knee prosthesis on the right he is working with physical therapy on using this. Ortho Exam On examination of the left above-knee amputation incision this is healing well there is no gaping  no sign of infection he does have an area more centrally that has not yet fully healed this is 2 cm in length there is granulation tissue no probing scant bloody drainage  Visit Diagnoses: No diagnosis found.  Plan: Sutures and staples harvested today.  Will leave to sutures he will follow-up in the office in 2 more weeks to harvest remaining sutures.  Continue daily dose of cleansing shrinker with direct skin contact.  Follow-Up Instructions: No follow-ups on file.   Imaging: No results found.  Orders:  No orders of the defined types were placed in this encounter.  No orders of the defined types were placed in this encounter.    PMFS History: Patient Active Problem List   Diagnosis Date Noted   Wound dehiscence 01/03/2021   Coagulopathy ([place]) 12/22/2020   CAD (coronary artery disease) 12/22/2020   Obesity 12/22/2020   Gross hematuria 12/20/2020   ABLA (acute blood loss anemia) 12/20/2020   Dehiscence of amputation stump (HCC)    Hx of BKA, left (HCC)    Cellulitis of left foot    Acute osteomyelitis of left calcaneus (HCC)    Ulcer of heel due to diabetes mellitus (HCC)    Normochromic normocytic anemia    Cellulitis 10/18/2020   Atypical chest pain 123XX123   Acute metabolic encephalopathy 123XX123   Hyperglycemia due to diabetes mellitus ([place]) 08/29/2020   Hyperglycemia 08/29/2020   Acute cerebrovascular accident (CVA) due to  ischemia ([place]) 04/28/2020   Type 2 diabetes mellitus with hyperlipidemia (HCC)    PAD (peripheral artery disease) ([place])    Hypoglycemia 04/25/2020   Acute ST elevation myocardial infarction (STEMI) due to occlusion of circumflex coronary artery ([place]) 03/22/2020   Uncontrolled type 2 diabetes mellitus with hyperglycemia ([place]) 01/14/2019   Elevated glucose 01/14/2019   Hx of BKA, right ([place]) 01/14/2019   Pressure injury of skin 11/11/2018   STEMI (ST elevation myocardial infarction) ([place]) 11/10/2018   CKD (chronic kidney disease) stage 4, GFR 15-29 ml/min ([place]) 11/10/2018   Amputation stump infection ([place]) 10/28/2018   Type II diabetes mellitus with renal manifestations ([place]) 08/07/2018   Uncontrolled type 2 diabetes mellitus with hyperosmolar nonketotic hyperglycemia ([place]) 08/07/2018   Non-pressure chronic ulcer of right calf limited to breakdown of skin ([place]) 07/06/2018   Type 2 diabetes mellitus without complication, without long-term current use of insulin ([place]) 06/15/2018   Chronic low back pain without sciatica 06/15/2018   Idiopathic chronic venous hypertension of left lower extremity with ulcer and inflammation (HCC)    Venous stasis ulcer of left calf limited to breakdown of skin without varicose veins ([place]) 04/23/2018   Acquired absence of right leg below knee ([place]) 06/13/2017   Acute kidney injury superimposed on CKD ([place]) 03/15/2017   Diabetic polyneuropathy associated with type 2 diabetes mellitus ([place]) 01/23/2017   AKI (  acute kidney injury) ([place]) 09/28/2016   Nausea vomiting and diarrhea 09/28/2016   Onychomycosis of multiple toenails with type 2 diabetes mellitus ([place]) 08/29/2015   MRSA carrier 04/20/2014   Penile wart 02/22/2014   HIV disease ([place])    Insulin-requiring or dependent type II diabetes mellitus ([place]) 02/04/2014   Dental anomaly 11/20/2012   Arthritis of right knee 02/23/2012   Hyperlipidemia 11/10/2011   Hyponatremia 11/10/2011   Chronic pain 08/07/2011    Meralgia paraesthetica 04/23/2011   ERECTILE DYSFUNCTION 08/22/2008   Elevated blood pressure reading with diagnosis of hypertension 05/19/2008   Past Medical History:  Diagnosis Date   Abscess of right foot    abscess/ulcer of R transtibial amputation requiring IV abx   AIDS ([place])    Anemia    CAD (coronary artery disease)    a. MI with stenting of OM1 in 11/2018 with residual disease. b. acute STEMI 03/2020 s/p DES to OM1   Chronic anemia    Chronic knee pain    right   Chronic pain    CKD (chronic kidney disease), stage IV (HCC)    CVA (cerebral vascular accident) ([place]) 04/2020   Diabetes type 2, uncontrolled    HgA1c 17.6 (04/27/2010)   Diabetic foot ulcer ([place]) 01/2017   right foot   Dilatation of aorta (HCC)    Erectile dysfunction    Genital warts    GERD (gastroesophageal reflux disease)    History of blood transfusion    HIV (human immunodeficiency virus infection) ([place]) 2009   CD4 count 100, VL 13800 (05/01/2010)   Hyperlipidemia    Hypertension    Myocardial infarction ([place])    Neuropathy    Noncompliance with medication regimen    Osteomyelitis ([place])    h/o hand   Osteomyelitis of metatarsal ([place]) 04/28/2017   Pneumonia     Family History  Problem Relation Age of Onset   Hypertension Mother    Arthritis Father    Hypertension Father    Hypertension Brother    Cancer Maternal Grandmother 65       unknown type of cancer   Depression Paternal Grandmother     Past Surgical History:  Procedure Laterality Date   AMPUTATION Right 05/02/2017   Procedure: AMPUTATION TRANSMETARSAL;  Surgeon: [name], MD;  Location: [place];  Service: Orthopedics;  Laterality: Right;   AMPUTATION Right 06/13/2017   Procedure: RIGHT BELOW KNEE AMPUTATION;  Surgeon: [name], MD;  Location: [place];  Service: Orthopedics;  Laterality: Right;   AMPUTATION Left 10/27/2020   Procedure: LEFT BELOW KNEE AMPUTATION;  Surgeon: [name], MD;  Location: [place];  Service:  Orthopedics;  Laterality: Left;   AMPUTATION Left 11/17/2020   Procedure: REVISION AMPUTATION BELOW KNEE, LEFT;  Surgeon: [name], MD;  Location: [place];  Service: Orthopedics;  Laterality: Left;   AMPUTATION Left 01/03/2021   Procedure: LEFT ABOVE KNEE AMPUTATION;  Surgeon: [name], MD;  Location: [place];  Service: Orthopedics;  Laterality: Left;   APPLICATION OF WOUND VAC Left 10/27/2020   Procedure: APPLICATION OF WOUND VAC;  Surgeon: [name], MD;  Location: [place];  Service: Orthopedics;  Laterality: Left;   BELOW KNEE LEG AMPUTATION Right 06/13/2017   BELOW KNEE LEG AMPUTATION     CORONARY STENT INTERVENTION N/A 11/10/2018   Procedure: CORONARY STENT INTERVENTION;  Surgeon: Leonie Man, MD;  Location: [place] [facility];  Service: Cardiovascular;  Laterality: N/A;   CORONARY/GRAFT ACUTE MI  REVASCULARIZATION N/A 03/21/2020   Procedure: Coronary/Graft Acute MI Revascularization;  Surgeon: [name], MD;  Location: [place] [facility];  Service: Cardiovascular;  Laterality: N/A;   CYSTOSCOPY N/A 11/09/2020   Procedure: CYSTOSCOPY, EVACUATION OF CLOTS, FULGERATION OF BLADDER AND BILATERIAL RETROGRADE PYELOGRAMS;  Surgeon: Janith Lima, MD;  Location: [place];  Service: Urology;  Laterality: N/A;   HERNIA REPAIR     I & D EXTREMITY Left 08/21/2014   Procedure: INCISION AND DRAINAGE LEFT SMALL FINGER;  Surgeon: Leanora Cover, MD;  Location: [place];  Service: Orthopedics;  Laterality: Left;   I & D EXTREMITY Right 03/18/2017   Procedure: IRRIGATION AND DEBRIDEMENT EXTREMITY;  Surgeon: [name], MD;  Location: [place];  Service: Orthopedics;  Laterality: Right;   IR FLUORO GUIDE CV LINE RIGHT  11/02/2020   IR REMOVAL TUN CV CATH W/O FL  11/13/2020   IR US GUIDE VASC ACCESS RIGHT  11/02/2020   LEFT HEART CATH AND CORONARY ANGIOGRAPHY N/A 11/10/2018   Procedure: LEFT HEART CATH AND CORONARY ANGIOGRAPHY;  Surgeon: Leonie Man, MD;  Location: [place] CV [facility];  Service:  Cardiovascular;  Laterality: N/A;   LEFT HEART CATH AND CORONARY ANGIOGRAPHY N/A 03/21/2020   Procedure: LEFT HEART CATH AND CORONARY ANGIOGRAPHY;  Surgeon: [name], MD;  Location: [place] [facility];  Service: Cardiovascular;  Laterality: N/A;   MINOR IRRIGATION AND DEBRIDEMENT OF WOUND Right 04/22/2014   Procedure: IRRIGATION AND DEBRIDEMENT OF RIGHT NECK ABCESS;  Surgeon: Jerrell Belfast, MD;  Location: [place];  Service: ENT;  Laterality: Right;   MULTIPLE EXTRACTIONS WITH ALVEOLOPLASTY N/A 01/18/2013   Procedure: MULTIPLE EXTRACION 3, 6, 7, 10, 11, 13, 21, 22, 27, 28, 29, 30 WITH ALVEOLOPLASTY;  Surgeon: Gae Bon, DDS;  Location: [place];  Service: Oral Surgery;  Laterality: N/A;   SKIN SPLIT GRAFT Right 03/21/2017   Procedure: IRRIGATION AND DEBRIDEMENT RIGHT FOOT AND APPLY SPLIT THICKNESS SKIN GRAFT AND WOUND VAC;  Surgeon: [name], MD;  Location: [place];  Service: Orthopedics;  Laterality: Right;   STUMP REVISION Left 12/02/2020   Procedure: REVISION LEFT BELOW KNEE AMPUTATION;  Surgeon: [name], MD;  Location: [place];  Service: Orthopedics;  Laterality: Left;   TEE WITHOUT CARDIOVERSION N/A 05/02/2017   Procedure: TRANSESOPHAGEAL ECHOCARDIOGRAM (TEE);  Surgeon: Acie Fredrickson Wonda Cheng, MD;  Location: [place];  Service: Cardiovascular;  Laterality: N/A;  coincidental to orthopedic case   Social History   Occupational History   Occupation: disabled  Tobacco Use   Smoking status: Never   Smokeless tobacco: Never  Vaping Use   Vaping Use: Never used  Substance and Sexual Activity   Alcohol use: Not Currently   Drug use: Never    Comment: OTC ASA   Sexual activity: Yes    Partners: Female

## 2021-01-25 ENCOUNTER — Other Ambulatory Visit: Payer: Self-pay | Admitting: *Deleted

## 2021-01-25 NOTE — Patient Outreach (Signed)
[facility]) Care Management  01/25/2021  TUG REUM 1965-08-29 [name]   [facility] outreach attempt to patient/family, skilled nursing facility (snf)  [name] was discharged to [facility] snf on 01/16/21  RN CM outreached to [phone] This per voice message is his mother, Keith [name] number  No answer. [facility] RN CM left HIPAA HIPAA [facility] Portability and Accountability Act) compliant voicemail message along with CM's contact info.  RN CM outreached to [place] [phone] Transferred to the SW/CM for [name], Mrs Keith [name]  No answer. [facility] RN CM left HIPAA HIPAA [facility] Portability and Accountability Act) compliant voicemail message along with CM's contact info.   Plans RN CM will pend patient for case closure pending snf stay and possible return call from him, family or snf staff  Keith Lantz L. [name], RN, BSN, [place] Coordinator Office number [phone] Main [facility] number [phone] Fax number [phone]

## 2021-01-27 DIAGNOSIS — D689 Coagulation defect, unspecified: Secondary | ICD-10-CM | POA: Diagnosis not present

## 2021-01-27 DIAGNOSIS — I2510 Atherosclerotic heart disease of native coronary artery without angina pectoris: Secondary | ICD-10-CM | POA: Diagnosis not present

## 2021-01-27 DIAGNOSIS — E669 Obesity, unspecified: Secondary | ICD-10-CM | POA: Diagnosis not present

## 2021-01-31 DIAGNOSIS — B20 Human immunodeficiency virus [HIV] disease: Secondary | ICD-10-CM | POA: Diagnosis not present

## 2021-01-31 DIAGNOSIS — E876 Hypokalemia: Secondary | ICD-10-CM | POA: Diagnosis not present

## 2021-01-31 DIAGNOSIS — I739 Peripheral vascular disease, unspecified: Secondary | ICD-10-CM | POA: Diagnosis not present

## 2021-01-31 DIAGNOSIS — I10 Essential (primary) hypertension: Secondary | ICD-10-CM | POA: Diagnosis not present

## 2021-01-31 DIAGNOSIS — T8131XA Disruption of external operation (surgical) wound, not elsewhere classified, initial encounter: Secondary | ICD-10-CM | POA: Diagnosis not present

## 2021-01-31 DIAGNOSIS — E119 Type 2 diabetes mellitus without complications: Secondary | ICD-10-CM | POA: Diagnosis not present

## 2021-01-31 DIAGNOSIS — R69 Illness, unspecified: Secondary | ICD-10-CM | POA: Diagnosis not present

## 2021-01-31 DIAGNOSIS — M6281 Muscle weakness (generalized): Secondary | ICD-10-CM | POA: Diagnosis not present

## 2021-02-02 ENCOUNTER — Other Ambulatory Visit: Payer: Self-pay | Admitting: *Deleted

## 2021-02-02 NOTE — Patient Outreach (Addendum)
[facility]) Care Management  02/02/2021  Keith [name] 19-Jun-1965 [id number]   [facility] case closure  RN CM outreached to [employer] [phone] Pt remains at facility per Keith [name] Left another message for social worker Keith [name] ( no return call from previous message left for her)   Plan [facility] RN CM will close this case as patient is at [employer] with external care management services RN CM will share with facility SW option of referring pt back to [facility] prn pending a return call from her or pcp always welcome to refer pt  Goals Addressed               This Visit's Progress     Patient Stated     COMPLETED: Find Help in My Community [facility]) (pt-stated)        Timeframe:  Short-Term Goal Priority:  High Start Date:  10/10/20                           Expected End Date: 11/10/20                       Barriers: Health Behaviors Knowledge Support System Transportation     - follow-up on any referrals for help I am given   Notes:  02/02/21 case closure pt at [facility] with external care management services   7/12 Home visit RN visit today with follow to MD regarding wound concerns, follow up call to [facility] transportation regarding completing transportation assessment before being able to use transportation service. Patient reports not receiving a call yet. Outreach to [employer] transportation with patient , caseworker to contact him for assessment he has contact number if no return call in next 24 to 48 hours.  7/7 Verified home health service with [facility] in place, start of care 7/8, patient informed . Placed call to [facility] to follow up on home health services, placed call to [facility] as noted [facility] sent referral to [place], spoke with representative to follow up .          Keith [name] L. Keith Hamman, RN, BSN, [place] Coordinator Office number [phone] Mobile number [phone]  Main [facility] number  [phone] Fax number [phone]

## 2021-02-06 DIAGNOSIS — R030 Elevated blood-pressure reading, without diagnosis of hypertension: Secondary | ICD-10-CM | POA: Diagnosis not present

## 2021-02-06 DIAGNOSIS — G546 Phantom limb syndrome with pain: Secondary | ICD-10-CM | POA: Diagnosis not present

## 2021-02-06 DIAGNOSIS — T8131XA Disruption of external operation (surgical) wound, not elsewhere classified, initial encounter: Secondary | ICD-10-CM | POA: Diagnosis not present

## 2021-02-06 DIAGNOSIS — A419 Sepsis, unspecified organism: Secondary | ICD-10-CM | POA: Diagnosis not present

## 2021-02-06 DIAGNOSIS — Z0000 Encounter for general adult medical examination without abnormal findings: Secondary | ICD-10-CM | POA: Diagnosis not present

## 2021-02-06 DIAGNOSIS — E559 Vitamin D deficiency, unspecified: Secondary | ICD-10-CM | POA: Diagnosis not present

## 2021-02-06 DIAGNOSIS — I160 Hypertensive urgency: Secondary | ICD-10-CM | POA: Diagnosis not present

## 2021-02-06 DIAGNOSIS — I69354 Hemiplegia and hemiparesis following cerebral infarction affecting left non-dominant side: Secondary | ICD-10-CM | POA: Diagnosis not present

## 2021-02-06 DIAGNOSIS — Z6841 Body Mass Index (BMI) 40.0 and over, adult: Secondary | ICD-10-CM | POA: Diagnosis not present

## 2021-02-06 DIAGNOSIS — R69 Illness, unspecified: Secondary | ICD-10-CM | POA: Diagnosis not present

## 2021-02-06 DIAGNOSIS — I2510 Atherosclerotic heart disease of native coronary artery without angina pectoris: Secondary | ICD-10-CM | POA: Diagnosis not present

## 2021-02-06 DIAGNOSIS — F419 Anxiety disorder, unspecified: Secondary | ICD-10-CM | POA: Diagnosis not present

## 2021-02-06 DIAGNOSIS — N183 Chronic kidney disease, stage 3 unspecified: Secondary | ICD-10-CM | POA: Diagnosis not present

## 2021-02-06 DIAGNOSIS — E876 Hypokalemia: Secondary | ICD-10-CM | POA: Diagnosis not present

## 2021-02-06 DIAGNOSIS — R2689 Other abnormalities of gait and mobility: Secondary | ICD-10-CM | POA: Diagnosis not present

## 2021-02-06 DIAGNOSIS — D689 Coagulation defect, unspecified: Secondary | ICD-10-CM | POA: Diagnosis not present

## 2021-02-06 DIAGNOSIS — E162 Hypoglycemia, unspecified: Secondary | ICD-10-CM | POA: Diagnosis not present

## 2021-02-06 DIAGNOSIS — E1165 Type 2 diabetes mellitus with hyperglycemia: Secondary | ICD-10-CM | POA: Diagnosis not present

## 2021-02-06 DIAGNOSIS — D631 Anemia in chronic kidney disease: Secondary | ICD-10-CM | POA: Diagnosis not present

## 2021-02-06 DIAGNOSIS — I639 Cerebral infarction, unspecified: Secondary | ICD-10-CM | POA: Diagnosis not present

## 2021-02-06 DIAGNOSIS — B20 Human immunodeficiency virus [HIV] disease: Secondary | ICD-10-CM | POA: Diagnosis not present

## 2021-02-06 DIAGNOSIS — S78112A Complete traumatic amputation at level between left hip and knee, initial encounter: Secondary | ICD-10-CM | POA: Diagnosis not present

## 2021-02-06 DIAGNOSIS — E119 Type 2 diabetes mellitus without complications: Secondary | ICD-10-CM | POA: Diagnosis not present

## 2021-02-06 DIAGNOSIS — E7800 Pure hypercholesterolemia, unspecified: Secondary | ICD-10-CM | POA: Diagnosis not present

## 2021-02-06 DIAGNOSIS — D649 Anemia, unspecified: Secondary | ICD-10-CM | POA: Diagnosis not present

## 2021-02-06 DIAGNOSIS — Z89512 Acquired absence of left leg below knee: Secondary | ICD-10-CM | POA: Diagnosis not present

## 2021-02-06 DIAGNOSIS — M6281 Muscle weakness (generalized): Secondary | ICD-10-CM | POA: Diagnosis not present

## 2021-02-06 DIAGNOSIS — I10 Essential (primary) hypertension: Secondary | ICD-10-CM | POA: Diagnosis not present

## 2021-02-06 DIAGNOSIS — I739 Peripheral vascular disease, unspecified: Secondary | ICD-10-CM | POA: Diagnosis not present

## 2021-02-06 DIAGNOSIS — E669 Obesity, unspecified: Secondary | ICD-10-CM | POA: Diagnosis not present

## 2021-02-06 DIAGNOSIS — I6930 Unspecified sequelae of cerebral infarction: Secondary | ICD-10-CM | POA: Diagnosis not present

## 2021-02-06 DIAGNOSIS — E1129 Type 2 diabetes mellitus with other diabetic kidney complication: Secondary | ICD-10-CM | POA: Diagnosis not present

## 2021-02-06 DIAGNOSIS — N184 Chronic kidney disease, stage 4 (severe): Secondary | ICD-10-CM | POA: Diagnosis not present

## 2021-02-06 DIAGNOSIS — I129 Hypertensive chronic kidney disease with stage 1 through stage 4 chronic kidney disease, or unspecified chronic kidney disease: Secondary | ICD-10-CM | POA: Diagnosis not present

## 2021-02-06 DIAGNOSIS — E118 Type 2 diabetes mellitus with unspecified complications: Secondary | ICD-10-CM | POA: Diagnosis not present

## 2021-02-06 DIAGNOSIS — Z79899 Other long term (current) drug therapy: Secondary | ICD-10-CM | POA: Diagnosis not present

## 2021-02-07 ENCOUNTER — Encounter: Payer: Self-pay | Admitting: Family

## 2021-02-07 ENCOUNTER — Ambulatory Visit (INDEPENDENT_AMBULATORY_CARE_PROVIDER_SITE_OTHER): Payer: Medicare HMO | Admitting: Family

## 2021-02-07 DIAGNOSIS — E119 Type 2 diabetes mellitus without complications: Secondary | ICD-10-CM | POA: Diagnosis not present

## 2021-02-07 DIAGNOSIS — Z89612 Acquired absence of left leg above knee: Secondary | ICD-10-CM | POA: Insufficient documentation

## 2021-02-07 DIAGNOSIS — R69 Illness, unspecified: Secondary | ICD-10-CM | POA: Diagnosis not present

## 2021-02-07 DIAGNOSIS — I10 Essential (primary) hypertension: Secondary | ICD-10-CM | POA: Diagnosis not present

## 2021-02-07 DIAGNOSIS — E876 Hypokalemia: Secondary | ICD-10-CM | POA: Diagnosis not present

## 2021-02-07 NOTE — Progress Notes (Signed)
Post-Op Visit Note   Patient: Keith [name]           Date of Birth: March 14, 1966           MRN: [id number] Visit Date: 02/07/2021 [name], NP  Chief Complaint:  Chief Complaint  Patient presents with   Left Leg - Routine Post Op    Left AKA 01/03/21     HPI:  HPI The patient is a 55 year old gentleman seen status post left above-knee amputation September 28.  He is in a wheelchair for mobility.    Patient is a new left transfemoral amputee.  Patient's current comorbidities are not expected to impact the ability to function with the prescribed prosthesis. Patient verbally communicates a strong desire to use a prosthesis. Patient currently requires mobility aids to ambulate without a prosthesis.  Expects not to use mobility aids with a new prosthesis.  Patient is a K2 level ambulator that will use a prosthesis to walk around their home and the community over low level environmental barriers.      Ortho Exam On examination of the left above-knee amputation incision this is healing well.  There are 2 remaining sutures centrally over an area that has not yet fully healed this continues to be seen 2 cm in length there is no gaping no drainage today sutures were harvested today without incident scant bloody drainage.  There is no palpable fluctuance no hematoma    Visit Diagnoses: No diagnosis found.  Plan: Remaining sutures harvested.  Continue daily dose of cleansing.  Dry dressing changes.  Given an order for his prosthesis set up.  Follow-Up Instructions: Return in about 2 weeks (around 02/21/2021).   Imaging: No results found.  Orders:  No orders of the defined types were placed in this encounter.  No orders of the defined types were placed in this encounter.    PMFS History: Patient Active Problem List   Diagnosis Date Noted   Wound dehiscence 01/03/2021   Coagulopathy ([place]) 12/22/2020   CAD (coronary artery disease) 12/22/2020   Obesity  12/22/2020   Gross hematuria 12/20/2020   ABLA (acute blood loss anemia) 12/20/2020   Dehiscence of amputation stump (HCC)    Hx of BKA, left (HCC)    Cellulitis of left foot    Acute osteomyelitis of left calcaneus (HCC)    Ulcer of heel due to diabetes mellitus (HCC)    Normochromic normocytic anemia    Cellulitis 10/18/2020   Atypical chest pain 73/41/9379   Acute metabolic encephalopathy 02/40/9735   Hyperglycemia due to diabetes mellitus ([place]) 08/29/2020   Hyperglycemia 08/29/2020   Acute cerebrovascular accident (CVA) due to ischemia ([place]) 04/28/2020   Type 2 diabetes mellitus with hyperlipidemia (HCC)    PAD (peripheral artery disease) ([place])    Hypoglycemia 04/25/2020   Acute ST elevation myocardial infarction (STEMI) due to occlusion of circumflex coronary artery ([place]) 03/22/2020   Uncontrolled type 2 diabetes mellitus with hyperglycemia ([place]) 01/14/2019   Elevated glucose 01/14/2019   Hx of BKA, right ([place]) 01/14/2019   Pressure injury of skin 11/11/2018   STEMI (ST elevation myocardial infarction) ([place]) 11/10/2018   CKD (chronic kidney disease) stage 4, GFR 15-29 ml/min ([place]) 11/10/2018   Amputation stump infection ([place]) 10/28/2018   Type II diabetes mellitus with renal manifestations ([place]) 08/07/2018   Uncontrolled type 2 diabetes mellitus with hyperosmolar nonketotic hyperglycemia ([place]) 08/07/2018   Non-pressure chronic ulcer of right calf limited to breakdown of skin ([place]) 07/06/2018  Type 2 diabetes mellitus without complication, without long-term current use of insulin ([place]) 06/15/2018   Chronic low back pain without sciatica 06/15/2018   Idiopathic chronic venous hypertension of left lower extremity with ulcer and inflammation (HCC)    Venous stasis ulcer of left calf limited to breakdown of skin without varicose veins ([place]) 04/23/2018   Acquired absence of right leg below knee ([place]) 06/13/2017   Acute kidney injury superimposed on CKD ([place]) 03/15/2017   Diabetic  polyneuropathy associated with type 2 diabetes mellitus ([place]) 01/23/2017   AKI (acute kidney injury) ([place]) 09/28/2016   Nausea vomiting and diarrhea 09/28/2016   Onychomycosis of multiple toenails with type 2 diabetes mellitus ([place]) 08/29/2015   MRSA carrier 04/20/2014   Penile wart 02/22/2014   HIV disease ([place])    Insulin-requiring or dependent type II diabetes mellitus ([place]) 02/04/2014   Dental anomaly 11/20/2012   Arthritis of right knee 02/23/2012   Hyperlipidemia 11/10/2011   Hyponatremia 11/10/2011   Chronic pain 08/07/2011   Meralgia paraesthetica 04/23/2011   ERECTILE DYSFUNCTION 08/22/2008   Elevated blood pressure reading with diagnosis of hypertension 05/19/2008   Past Medical History:  Diagnosis Date   Abscess of right foot    abscess/ulcer of R transtibial amputation requiring IV abx   AIDS ([place])    Anemia    CAD (coronary artery disease)    a. MI with stenting of OM1 in 11/2018 with residual disease. b. acute STEMI 03/2020 s/p DES to OM1   Chronic anemia    Chronic knee pain    right   Chronic pain    CKD (chronic kidney disease), stage IV (HCC)    CVA (cerebral vascular accident) ([place]) 04/2020   Diabetes type 2, uncontrolled    HgA1c 17.6 (04/27/2010)   Diabetic foot ulcer ([place]) 01/2017   right foot   Dilatation of aorta (HCC)    Erectile dysfunction    Genital warts    GERD (gastroesophageal reflux disease)    History of blood transfusion    HIV (human immunodeficiency virus infection) ([place]) 2009   CD4 count 100, VL 13800 (05/01/2010)   Hyperlipidemia    Hypertension    Myocardial infarction ([place])    Neuropathy    Noncompliance with medication regimen    Osteomyelitis ([place])    h/o hand   Osteomyelitis of metatarsal ([place]) 04/28/2017   Pneumonia     Family History  Problem Relation Age of Onset   Hypertension Mother    Arthritis Father    Hypertension Father    Hypertension Brother    Cancer Maternal Grandmother 17       unknown type of cancer    Depression Paternal Grandmother     Past Surgical History:  Procedure Laterality Date   AMPUTATION Right 05/02/2017   Procedure: AMPUTATION TRANSMETARSAL;  Surgeon: [name], MD;  Location: [place];  Service: Orthopedics;  Laterality: Right;   AMPUTATION Right 06/13/2017   Procedure: RIGHT BELOW KNEE AMPUTATION;  Surgeon: [name], MD;  Location: [place];  Service: Orthopedics;  Laterality: Right;   AMPUTATION Left 10/27/2020   Procedure: LEFT BELOW KNEE AMPUTATION;  Surgeon: [name], MD;  Location: [place];  Service: Orthopedics;  Laterality: Left;   AMPUTATION Left 11/17/2020   Procedure: REVISION AMPUTATION BELOW KNEE, LEFT;  Surgeon: [name], MD;  Location: [place];  Service: Orthopedics;  Laterality: Left;   AMPUTATION Left 01/03/2021   Procedure: LEFT ABOVE KNEE AMPUTATION;  Surgeon: [name], MD;  Location: [place];  Service: Orthopedics;  Laterality: Left;   APPLICATION OF WOUND VAC Left 10/27/2020   Procedure: APPLICATION OF WOUND VAC;  Surgeon: [name], MD;  Location: [place];  Service: Orthopedics;  Laterality: Left;   BELOW KNEE LEG AMPUTATION Right 06/13/2017   BELOW KNEE LEG AMPUTATION     CORONARY STENT INTERVENTION N/A 11/10/2018   Procedure: CORONARY STENT INTERVENTION;  Surgeon: Leonie Man, MD;  Location: [place] CV LAB;  Service: Cardiovascular;  Laterality: N/A;   CORONARY/GRAFT ACUTE MI REVASCULARIZATION N/A 03/21/2020   Procedure: Coronary/Graft Acute MI Revascularization;  Surgeon: [name], MD;  Location: [place] CV LAB;  Service: Cardiovascular;  Laterality: N/A;   CYSTOSCOPY N/A 11/09/2020   Procedure: CYSTOSCOPY, EVACUATION OF CLOTS, FULGERATION OF BLADDER AND BILATERIAL RETROGRADE PYELOGRAMS;  Surgeon: Janith Lima, MD;  Location: [place];  Service: Urology;  Laterality: N/A;   HERNIA REPAIR     I & D EXTREMITY Left 08/21/2014   Procedure: INCISION AND DRAINAGE LEFT SMALL FINGER;  Surgeon: Leanora Cover, MD;  Location: [place];  Service: Orthopedics;  Laterality: Left;   I & D EXTREMITY Right 03/18/2017   Procedure: IRRIGATION AND DEBRIDEMENT EXTREMITY;  Surgeon: [name], MD;  Location: [place];  Service: Orthopedics;  Laterality: Right;   IR FLUORO GUIDE CV LINE RIGHT  11/02/2020   IR REMOVAL TUN CV CATH W/O FL  11/13/2020   IR US GUIDE VASC ACCESS RIGHT  11/02/2020   LEFT HEART CATH AND CORONARY ANGIOGRAPHY N/A 11/10/2018   Procedure: LEFT HEART CATH AND CORONARY ANGIOGRAPHY;  Surgeon: Leonie Man, MD;  Location: [place] [facility];  Service: Cardiovascular;  Laterality: N/A;   LEFT HEART CATH AND CORONARY ANGIOGRAPHY N/A 03/21/2020   Procedure: LEFT HEART CATH AND CORONARY ANGIOGRAPHY;  Surgeon: [name], MD;  Location: [place] CV [facility];  Service: Cardiovascular;  Laterality: N/A;   MINOR IRRIGATION AND DEBRIDEMENT OF WOUND Right 04/22/2014   Procedure: IRRIGATION AND DEBRIDEMENT OF RIGHT NECK ABCESS;  Surgeon: Jerrell Belfast, MD;  Location: [place];  Service: ENT;  Laterality: Right;   MULTIPLE EXTRACTIONS WITH ALVEOLOPLASTY N/A 01/18/2013   Procedure: MULTIPLE EXTRACION 3, 6, 7, 10, 11, 13, 21, 22, 27, 28, 29, 30 WITH ALVEOLOPLASTY;  Surgeon: Gae Bon, DDS;  Location: [place];  Service: Oral Surgery;  Laterality: N/A;   SKIN SPLIT GRAFT Right 03/21/2017   Procedure: IRRIGATION AND DEBRIDEMENT RIGHT FOOT AND APPLY SPLIT THICKNESS SKIN GRAFT AND WOUND VAC;  Surgeon: [name], MD;  Location: [place];  Service: Orthopedics;  Laterality: Right;   STUMP REVISION Left 12/02/2020   Procedure: REVISION LEFT BELOW KNEE AMPUTATION;  Surgeon: [name], MD;  Location: [place];  Service: Orthopedics;  Laterality: Left;   TEE WITHOUT CARDIOVERSION N/A 05/02/2017   Procedure: TRANSESOPHAGEAL ECHOCARDIOGRAM (TEE);  Surgeon: Acie Fredrickson Wonda Cheng, MD;  Location: [place];  Service: Cardiovascular;  Laterality: N/A;  coincidental to orthopedic case   Social History   Occupational History   Occupation: disabled   Tobacco Use   Smoking status: Never   Smokeless tobacco: Never  Vaping Use   Vaping Use: Never used  Substance and Sexual Activity   Alcohol use: Not Currently   Drug use: Never    Comment: OTC ASA   Sexual activity: Yes    Partners: Female

## 2021-02-09 ENCOUNTER — Telehealth: Payer: Self-pay

## 2021-02-09 NOTE — Telephone Encounter (Signed)
Patient currently working with [facility]. Patient is now in SNF at [facility]. Coordinated with staff a follow up visit with Dr. [name] next week. Eugenia Mcalpine

## 2021-02-13 ENCOUNTER — Ambulatory Visit: Payer: Medicare HMO | Admitting: Infectious Diseases

## 2021-02-13 ENCOUNTER — Encounter: Payer: Self-pay | Admitting: Family

## 2021-02-13 ENCOUNTER — Ambulatory Visit (INDEPENDENT_AMBULATORY_CARE_PROVIDER_SITE_OTHER): Payer: Medicare HMO | Admitting: Family

## 2021-02-13 ENCOUNTER — Other Ambulatory Visit: Payer: Self-pay

## 2021-02-13 VITALS — BP 178/92 | HR 91 | Temp 98.0°F

## 2021-02-13 DIAGNOSIS — R69 Illness, unspecified: Secondary | ICD-10-CM | POA: Diagnosis not present

## 2021-02-13 DIAGNOSIS — Z0000 Encounter for general adult medical examination without abnormal findings: Secondary | ICD-10-CM | POA: Insufficient documentation

## 2021-02-13 DIAGNOSIS — B20 Human immunodeficiency virus [HIV] disease: Secondary | ICD-10-CM

## 2021-02-13 DIAGNOSIS — F419 Anxiety disorder, unspecified: Secondary | ICD-10-CM

## 2021-02-13 MED ORDER — DORAVIRINE 100 MG PO TABS
100.0000 mg | ORAL_TABLET | Freq: Every day | ORAL | 5 refills | Status: DC
Start: 2021-02-13 — End: 2022-03-18

## 2021-02-13 MED ORDER — DOVATO 50-300 MG PO TABS: 1.0000 | ORAL_TABLET | Freq: Every day | ORAL | 5 refills | Status: DC

## 2021-02-13 MED ORDER — ALPRAZOLAM 1 MG PO TABS: 1.0000 mg | ORAL_TABLET | Freq: Every day | ORAL | 0 refills | Status: DC | PRN

## 2021-02-13 MED ORDER — DAPSONE 100 MG PO TABS: ORAL_TABLET | Freq: Every day | ORAL | 5 refills | Status: AC

## 2021-02-13 MED ORDER — ESCITALOPRAM OXALATE 20 MG PO TABS: 20.0000 mg | ORAL_TABLET | Freq: Every day | ORAL | 3 refills | Status: DC | PRN

## 2021-02-13 NOTE — Progress Notes (Signed)
[name], male    DOB: 1966/01/30, 55 y.o.   MRN: [id number]  Subjective:    Chief Complaint  Patient presents with   Follow-up    B20 - patient would like to discuss possible anxiety medication.     HPI:  Keith [name] is a 55 y.o. male with HIV diseae last seen on 05/19/20 by Dr. [name] with less than optimally controlled virus with Dovato and Pilfeltro. Viral load at the time was 1,780 and CD4 count of 140. Subsequent lab work completed on 12/12/20 with CD4 count of 103. Here today for follow up.  Keith [name] has been taking his Dovato and Pilfeltro as prescribed. He remains at [facility] and work on rehabilitation for AKA on the left. Feeling okay today with increased levels of anxiety secondary to the surgery for which he has been taking Xanax. Denies fevers, chills, night sweats, headaches, changes in vision, neck pain/stiffness, nausea, diarrhea, vomiting, lesions or rashes.  Keith [name] has no problems obtaining medication from the pharmacy and remains covered by [facility]. No current recreational or illicit drug use, tobacco use or alcohol consumption. Condoms offered. Influenza vaccination up to date.    Allergies  Allergen Reactions   Elemental Sulfur Itching    Patient stated he's allergic to "sulfur" AND "sulfa"   Sulfa Antibiotics Itching      Outpatient Medications Prior to Visit  Medication Sig Dispense Refill   acetaminophen (TYLENOL) 325 MG tablet Take 2 tablets (650 mg total) by mouth every 4 (four) hours as needed for mild pain, fever or headache.     aspirin 81 MG EC tablet Take 1 tablet (81 mg total) by mouth daily. 30 tablet 12   atorvastatin (LIPITOR) 80 MG tablet Take 1 tablet (80 mg total) by mouth daily. 30 tablet 1   blood glucose meter kit and supplies Dispense based on patient and insurance preference. Use up to four times daily as directed. (FOR ICD-10 E10.9, E11.9). 1 each 0   gabapentin (NEURONTIN) 100 MG capsule Take 1  capsule (100 mg total) by mouth 3 (three) times daily. 90 capsule 1   glucose blood (COOL BLOOD GLUCOSE TEST STRIPS) test strip Use as instructed 100 each 0   icosapent Ethyl (VASCEPA) 1 g capsule TAKE 2 CAPSULES (2 G TOTAL) BY MOUTH TWO TIMES DAILY. (Patient taking differently: Take 2 g by mouth 2 (two) times daily.) 120 capsule 3   Lancets (ONETOUCH ULTRASOFT) lancets Use as instructed 100 each 12   nitroGLYCERIN (NITROSTAT) 0.4 MG SL tablet PLACE 1 TABLET (0.4 MG TOTAL) UNDER THE TONGUE EVERY FIVE MINUTES X 3 DOSES AS NEEDED FOR CHEST PAIN. (Patient taking differently: Place 0.4 mg under the tongue every 5 (five) minutes x 3 doses as needed for chest pain.) 25 tablet 12   oxybutynin (DITROPAN) 5 MG tablet Take 1/2 tablet (2.5 mg total) by mouth 2 (two) times daily. 30 tablet 1   oxyCODONE-acetaminophen (PERCOCET) 10-325 MG tablet Take 1 tablet by mouth every 4 (four) hours as needed for pain. 30 tablet 0   traZODone (DESYREL) 100 MG tablet Take 100 mg by mouth at bedtime.     dapsone 100 MG tablet TAKE 1 TABLET (100 MG TOTAL) BY MOUTH DAILY. (Patient taking differently: Take 100 mg by mouth daily.) 30 tablet 0   Dolutegravir-lamiVUDine (DOVATO) 50-300 MG TABS Take 1 tablet by mouth daily. 30 tablet 0   doravirine (PIFELTRO) 100 MG TABS tablet Take 1 tablet (100  mg total) by mouth daily. 30 tablet 0   escitalopram (LEXAPRO) 20 MG tablet Take 20 mg by mouth daily.     allopurinol (ZYLOPRIM) 100 MG tablet Take 1 tablet (100 mg total) by mouth daily. 30 tablet 1   carvedilol (COREG) 3.125 MG tablet Take 1 tablet (3.125 mg total) by mouth 2 (two) times daily. 60 tablet 1   ferrous sulfate 325 (65 FE) MG tablet Take 1 tablet (325 mg total) by mouth every Monday, Wednesday, and Friday at 6 PM. Do not take with other medicines. 12 tablet 0   pantoprazole (PROTONIX) 40 MG tablet Take 1 tablet (40 mg total) by mouth daily. 30 tablet 0   ALPRAZolam (XANAX) 1 MG tablet Take 1 tablet (1 mg total) by mouth  daily as needed for anxiety. (Patient not taking: Reported on 02/13/2021) 10 tablet 0   No facility-administered medications prior to visit.     Past Medical History:  Diagnosis Date   Abscess of right foot    abscess/ulcer of R transtibial amputation requiring IV abx   AIDS ([place])    Anemia    CAD (coronary artery disease)    a. MI with stenting of OM1 in 11/2018 with residual disease. b. acute STEMI 03/2020 s/p DES to OM1   Chronic anemia    Chronic knee pain    right   Chronic pain    CKD (chronic kidney disease), stage IV (HCC)    CVA (cerebral vascular accident) ([place]) 04/2020   Diabetes type 2, uncontrolled    HgA1c 17.6 (04/27/2010)   Diabetic foot ulcer ([place]) 01/2017   right foot   Dilatation of aorta (HCC)    Erectile dysfunction    Genital warts    GERD (gastroesophageal reflux disease)    History of blood transfusion    HIV (human immunodeficiency virus infection) ([place]) 2009   CD4 count 100, VL 13800 (05/01/2010)   Hyperlipidemia    Hypertension    Myocardial infarction ([place])    Neuropathy    Noncompliance with medication regimen    Osteomyelitis ([place])    h/o hand   Osteomyelitis of metatarsal ([place]) 04/28/2017   Pneumonia      Past Surgical History:  Procedure Laterality Date   AMPUTATION Right 05/02/2017   Procedure: AMPUTATION TRANSMETARSAL;  Surgeon: [name], MD;  Location: [place];  Service: Orthopedics;  Laterality: Right;   AMPUTATION Right 06/13/2017   Procedure: RIGHT BELOW KNEE AMPUTATION;  Surgeon: [name], MD;  Location: [place];  Service: Orthopedics;  Laterality: Right;   AMPUTATION Left 10/27/2020   Procedure: LEFT BELOW KNEE AMPUTATION;  Surgeon: [name], MD;  Location: [place];  Service: Orthopedics;  Laterality: Left;   AMPUTATION Left 11/17/2020   Procedure: REVISION AMPUTATION BELOW KNEE, LEFT;  Surgeon: [name], MD;  Location: [place];  Service: Orthopedics;  Laterality: Left;   AMPUTATION Left 01/03/2021   Procedure: LEFT  ABOVE KNEE AMPUTATION;  Surgeon: [name], MD;  Location: [place];  Service: Orthopedics;  Laterality: Left;   APPLICATION OF WOUND VAC Left 10/27/2020   Procedure: APPLICATION OF WOUND VAC;  Surgeon: [name], MD;  Location: [place];  Service: Orthopedics;  Laterality: Left;   BELOW KNEE LEG AMPUTATION Right 06/13/2017   BELOW KNEE LEG AMPUTATION     CORONARY STENT INTERVENTION N/A 11/10/2018   Procedure: CORONARY STENT INTERVENTION;  Surgeon: Leonie Man, MD;  Location: [place] [facility];  Service: Cardiovascular;  Laterality:  N/A;   CORONARY/GRAFT ACUTE MI REVASCULARIZATION N/A 03/21/2020   Procedure: Coronary/Graft Acute MI Revascularization;  Surgeon: [name], MD;  Location: [place] [facility];  Service: Cardiovascular;  Laterality: N/A;   CYSTOSCOPY N/A 11/09/2020   Procedure: CYSTOSCOPY, EVACUATION OF CLOTS, FULGERATION OF BLADDER AND BILATERIAL RETROGRADE PYELOGRAMS;  Surgeon: Janith Lima, MD;  Location: [place];  Service: Urology;  Laterality: N/A;   HERNIA REPAIR     I & D EXTREMITY Left 08/21/2014   Procedure: INCISION AND DRAINAGE LEFT SMALL FINGER;  Surgeon: Leanora Cover, MD;  Location: [place];  Service: Orthopedics;  Laterality: Left;   I & D EXTREMITY Right 03/18/2017   Procedure: IRRIGATION AND DEBRIDEMENT EXTREMITY;  Surgeon: [name], MD;  Location: [place];  Service: Orthopedics;  Laterality: Right;   IR FLUORO GUIDE CV LINE RIGHT  11/02/2020   IR REMOVAL TUN CV CATH W/O FL  11/13/2020   IR US GUIDE VASC ACCESS RIGHT  11/02/2020   LEFT HEART CATH AND CORONARY ANGIOGRAPHY N/A 11/10/2018   Procedure: LEFT HEART CATH AND CORONARY ANGIOGRAPHY;  Surgeon: Leonie Man, MD;  Location: [place] [facility];  Service: Cardiovascular;  Laterality: N/A;   LEFT HEART CATH AND CORONARY ANGIOGRAPHY N/A 03/21/2020   Procedure: LEFT HEART CATH AND CORONARY ANGIOGRAPHY;  Surgeon: [name], MD;  Location: [place] [facility];  Service: Cardiovascular;  Laterality: N/A;    MINOR IRRIGATION AND DEBRIDEMENT OF WOUND Right 04/22/2014   Procedure: IRRIGATION AND DEBRIDEMENT OF RIGHT NECK ABCESS;  Surgeon: Jerrell Belfast, MD;  Location: [place];  Service: ENT;  Laterality: Right;   MULTIPLE EXTRACTIONS WITH ALVEOLOPLASTY N/A 01/18/2013   Procedure: MULTIPLE EXTRACION 3, 6, 7, 10, 11, 13, 21, 22, 27, 28, 29, 30 WITH ALVEOLOPLASTY;  Surgeon: Gae Bon, DDS;  Location: [place];  Service: Oral Surgery;  Laterality: N/A;   SKIN SPLIT GRAFT Right 03/21/2017   Procedure: IRRIGATION AND DEBRIDEMENT RIGHT FOOT AND APPLY SPLIT THICKNESS SKIN GRAFT AND WOUND VAC;  Surgeon: [name], MD;  Location: [place];  Service: Orthopedics;  Laterality: Right;   STUMP REVISION Left 12/02/2020   Procedure: REVISION LEFT BELOW KNEE AMPUTATION;  Surgeon: [name], MD;  Location: [place];  Service: Orthopedics;  Laterality: Left;   TEE WITHOUT CARDIOVERSION N/A 05/02/2017   Procedure: TRANSESOPHAGEAL ECHOCARDIOGRAM (TEE);  Surgeon: Acie Fredrickson Wonda Cheng, MD;  Location: [facility];  Service: Cardiovascular;  Laterality: N/A;  coincidental to orthopedic case      Review of Systems  Constitutional:  Negative for appetite change, chills, fatigue, fever and unexpected weight change.  Eyes:  Negative for visual disturbance.  Respiratory:  Negative for cough, chest tightness, shortness of breath and wheezing.   Cardiovascular:  Negative for chest pain and leg swelling.  Gastrointestinal:  Negative for abdominal pain, constipation, diarrhea, nausea and vomiting.  Genitourinary:  Negative for dysuria, flank pain, frequency, genital sores, hematuria and urgency.  Skin:  Negative for rash.  Allergic/Immunologic: Negative for immunocompromised state.  Neurological:  Negative for dizziness and headaches.  Psychiatric/Behavioral:  The patient is nervous/anxious.      Objective:    BP (!) 178/92   Pulse 91   Temp 98 F (36.7 C) (Oral)   SpO2 98%  Nursing note and vital signs reviewed.  Physical  Exam Constitutional:      General: He is not in acute distress.    Appearance: He is well-developed.     Comments: Seated in the  wheelchair; pleasant.   Cardiovascular:     Rate and Rhythm: Normal rate and regular rhythm.     Heart sounds: Normal heart sounds.  Pulmonary:     Effort: Pulmonary effort is normal.     Breath sounds: Normal breath sounds.  Skin:    General: Skin is warm and dry.  Neurological:     Mental Status: He is alert and oriented to person, place, and time.  Psychiatric:        Mood and Affect: Mood normal.     Depression screen [facility] 2/9 10/17/2020 05/19/2020 11/29/2019 01/13/2019 11/30/2018  Decreased Interest 0 0 0 0 0  Down, Depressed, Hopeless 0 0 0 0 1  PHQ - 2 Score 0 0 0 0 1  Some recent data might be hidden       Assessment & Plan:    Patient Active Problem List   Diagnosis Date Noted   Anxiety 02/13/2021   Healthcare maintenance 02/13/2021   History of left above knee amputation ([place]) 02/07/2021   Wound dehiscence 01/03/2021   Coagulopathy ([place]) 12/22/2020   CAD (coronary artery disease) 12/22/2020   Obesity 12/22/2020   Gross hematuria 12/20/2020   ABLA (acute blood loss anemia) 12/20/2020   Dehiscence of amputation stump (HCC)    Hx of BKA, left ([place])    Cellulitis of left foot    Acute osteomyelitis of left calcaneus (HCC)    Ulcer of heel due to diabetes mellitus (HCC)    Normochromic normocytic anemia    Cellulitis 10/18/2020   Atypical chest pain 65/99/3570   Acute metabolic encephalopathy 17/79/3903   Hyperglycemia due to diabetes mellitus ([place]) 08/29/2020   Hyperglycemia 08/29/2020   Acute cerebrovascular accident (CVA) due to ischemia ([place]) 04/28/2020   Type 2 diabetes mellitus with hyperlipidemia (HCC)    PAD (peripheral artery disease) ([place])    Hypoglycemia 04/25/2020   Acute ST elevation myocardial infarction (STEMI) due to occlusion of circumflex coronary artery ([place]) 03/22/2020   Uncontrolled type 2 diabetes mellitus with  hyperglycemia ([place]) 01/14/2019   Elevated glucose 01/14/2019   Hx of BKA, right ([place]) 01/14/2019   Pressure injury of skin 11/11/2018   STEMI (ST elevation myocardial infarction) ([place]) 11/10/2018   CKD (chronic kidney disease) stage 4, GFR 15-29 ml/min ([place]) 11/10/2018   Amputation stump infection ([place]) 10/28/2018   Type II diabetes mellitus with renal manifestations ([place]) 08/07/2018   Uncontrolled type 2 diabetes mellitus with hyperosmolar nonketotic hyperglycemia ([place]) 08/07/2018   Non-pressure chronic ulcer of right calf limited to breakdown of skin ([place]) 07/06/2018   Type 2 diabetes mellitus without complication, without long-term current use of insulin ([place]) 06/15/2018   Chronic low back pain without sciatica 06/15/2018   Idiopathic chronic venous hypertension of left lower extremity with ulcer and inflammation (HCC)    Venous stasis ulcer of left calf limited to breakdown of skin without varicose veins ([place]) 04/23/2018   Acquired absence of right leg below knee ([place]) 06/13/2017   Acute kidney injury superimposed on CKD ([place]) 03/15/2017   Diabetic polyneuropathy associated with type 2 diabetes mellitus ([place]) 01/23/2017   AKI (acute kidney injury) ([place]) 09/28/2016   Nausea vomiting and diarrhea 09/28/2016   Onychomycosis of multiple toenails with type 2 diabetes mellitus ([place]) 08/29/2015   MRSA carrier 04/20/2014   Penile wart 02/22/2014   HIV disease ([place])    Insulin-requiring or dependent type II diabetes mellitus ([place]) 02/04/2014   Dental anomaly 11/20/2012   Arthritis of right knee 02/23/2012   Hyperlipidemia  11/10/2011   Hyponatremia 11/10/2011   Chronic pain 08/07/2011   Meralgia paraesthetica 04/23/2011   ERECTILE DYSFUNCTION 08/22/2008   Elevated blood pressure reading with diagnosis of hypertension 05/19/2008     Problem List Items Addressed This Visit       Other   HIV disease ([place]) - Primary (Chronic)    Keith [name] 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 He  remains at risk for opportunistic infection with low CD4 count and will need to continue dapsone for prevention. We reviewed previous lab work and discussed plan of care. Check lab work today. Continue current dose of Dovato, Pifeltro and dapsone. Plan for follow up with Dr. [name].       Relevant Medications   dolutegravir-lamiVUDine (DOVATO) 50-300 MG tablet   doravirine (PIFELTRO) 100 MG TABS tablet   dapsone 100 MG tablet   Other Relevant Orders   COMPLETE METABOLIC PANEL WITH GFR   HIV-1 RNA quant-no reflex-bld   T-helper cell (CD4)- ([facility] clinic only)   Anxiety    Keith [name] has poorly controlled anxiety follow his most recent surgery. Encouraged him to continue with Lexarpo. He asked for a refill of Xanax that he has been taking reporting 3 tablets daily. Reviewed [place] Controlled Substance Database and it appears he has been receiving 1 mg daily. Will refill 1 prescription for Xanax. Additional refills will need to be filled by primary care provider or provider that is managing his anxiety.       Relevant Medications   ALPRAZolam (XANAX) 1 MG tablet   escitalopram (LEXAPRO) 20 MG tablet   Healthcare maintenance    Discussed importance of safe sexual practice and condom use. Condoms offered.  Vaccines are up to date per recommendations.         I have changed Keith [name]'s Dovato and escitalopram. I am also having him maintain his Cool Blood Glucose Test Strips, onetouch ultrasoft, aspirin, acetaminophen, icosapent Ethyl, nitroGLYCERIN, atorvastatin, gabapentin, traZODone, blood glucose meter kit and supplies, allopurinol, oxybutynin, pantoprazole, carvedilol, ferrous sulfate, oxyCODONE-acetaminophen, doravirine, ALPRAZolam, and dapsone.   Meds ordered this encounter  Medications   dolutegravir-lamiVUDine (DOVATO) 50-300 MG tablet    Sig: Take 1 tablet by mouth daily.    Dispense:  30 tablet    Refill:  5    Order Specific Question:   Supervising Provider    Answer:    Baxter Flattery, CYNTHIA [[id number]]   doravirine (PIFELTRO) 100 MG TABS tablet    Sig: Take 1 tablet (100 mg total) by mouth daily.    Dispense:  30 tablet    Refill:  5    Order Specific Question:   Supervising Provider    Answer:   Baxter Flattery, CYNTHIA [[id number]]   ALPRAZolam (XANAX) 1 MG tablet    Sig: Take 1 tablet (1 mg total) by mouth daily as needed for anxiety.    Dispense:  30 tablet    Refill:  0    Order Specific Question:   Supervising Provider    Answer:   Baxter Flattery, CYNTHIA [[id number]]   escitalopram (LEXAPRO) 20 MG tablet    Sig: Take 1 tablet (20 mg total) by mouth daily as needed.    Dispense:  30 tablet    Refill:  3    Order Specific Question:   Supervising Provider    Answer:   Baxter Flattery, CYNTHIA [[id number]]   dapsone 100 MG tablet    Sig: TAKE 1 TABLET (100 MG TOTAL) BY MOUTH DAILY.    Dispense:  30  tablet    Refill:  5    Order Specific Question:   Supervising Provider    Answer:   Carlyle Basques [[id number]]     Follow-up: Return in about 6 months (around 08/13/2021), or if symptoms worsen or fail to improve.   Terri Piedra, MSN, FNP-C Nurse Practitioner [facility] [place] number: [phone]

## 2021-02-13 NOTE — Assessment & Plan Note (Signed)
Mr. Daman has poorly controlled anxiety follow his most recent surgery. Encouraged him to continue with Lexarpo. He asked for a refill of Xanax that he has been taking reporting 3 tablets daily. Reviewed [place] Controlled Substance Database and it appears he has been receiving 1 mg daily. Will refill 1 prescription for Xanax. Additional refills will need to be filled by primary care provider or provider that is managing his anxiety.

## 2021-02-13 NOTE — Patient Instructions (Addendum)
Nice to see you.  We will check your lab work today.  Continue to take your medication daily as prescribed.  Refills have been sent to the pharmacy.  Plan for follow up with Dr. [name] in 6 months or sooner if needed with lab work on the same day.  Have a great day and stay safe!

## 2021-02-13 NOTE — Assessment & Plan Note (Signed)
Mr. Ingwersen 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 He remains at risk for opportunistic infection with low CD4 count and will need to continue dapsone for prevention. We reviewed previous lab work and discussed plan of care. Check lab work today. Continue current dose of Dovato, Pifeltro and dapsone. Plan for follow up with Dr. [name].

## 2021-02-13 NOTE — Assessment & Plan Note (Signed)
   Discussed importance of safe sexual practice and condom use. Condoms offered.   Vaccines are up to date per recommendations.

## 2021-02-14 DIAGNOSIS — I739 Peripheral vascular disease, unspecified: Secondary | ICD-10-CM | POA: Diagnosis not present

## 2021-02-14 DIAGNOSIS — A419 Sepsis, unspecified organism: Secondary | ICD-10-CM | POA: Diagnosis not present

## 2021-02-14 DIAGNOSIS — E1165 Type 2 diabetes mellitus with hyperglycemia: Secondary | ICD-10-CM | POA: Diagnosis not present

## 2021-02-14 DIAGNOSIS — I639 Cerebral infarction, unspecified: Secondary | ICD-10-CM | POA: Diagnosis not present

## 2021-02-14 DIAGNOSIS — D631 Anemia in chronic kidney disease: Secondary | ICD-10-CM | POA: Diagnosis not present

## 2021-02-14 DIAGNOSIS — M6281 Muscle weakness (generalized): Secondary | ICD-10-CM | POA: Diagnosis not present

## 2021-02-14 DIAGNOSIS — T8131XA Disruption of external operation (surgical) wound, not elsewhere classified, initial encounter: Secondary | ICD-10-CM | POA: Diagnosis not present

## 2021-02-14 LAB — T-HELPER CELL (CD4) - (RCID CLINIC ONLY)
CD4 % Helper T Cell: 9 % — ABNORMAL LOW (ref 33–65)
CD4 T Cell Abs: 166 /uL — ABNORMAL LOW (ref 400–1790)

## 2021-02-15 DIAGNOSIS — E1165 Type 2 diabetes mellitus with hyperglycemia: Secondary | ICD-10-CM | POA: Diagnosis not present

## 2021-02-15 DIAGNOSIS — I2510 Atherosclerotic heart disease of native coronary artery without angina pectoris: Secondary | ICD-10-CM | POA: Diagnosis not present

## 2021-02-15 DIAGNOSIS — I10 Essential (primary) hypertension: Secondary | ICD-10-CM | POA: Diagnosis not present

## 2021-02-15 DIAGNOSIS — E119 Type 2 diabetes mellitus without complications: Secondary | ICD-10-CM | POA: Diagnosis not present

## 2021-02-15 LAB — COMPLETE METABOLIC PANEL WITH GFR
AG Ratio: 0.9 (calc) — ABNORMAL LOW (ref 1.0–2.5)
ALT: 17 U/L (ref 9–46)
AST: 15 U/L (ref 10–35)
Albumin: 3.2 g/dL — ABNORMAL LOW (ref 3.6–5.1)
Alkaline phosphatase (APISO): 109 U/L (ref 35–144)
BUN: 16 mg/dL (ref 7–25)
CO2: 26 mmol/L (ref 20–32)
Calcium: 8.6 mg/dL (ref 8.6–10.3)
Chloride: 105 mmol/L (ref 98–110)
Creat: 1.24 mg/dL (ref 0.70–1.30)
Globulin: 3.7 g/dL (calc) (ref 1.9–3.7)
Glucose, Bld: 145 mg/dL — ABNORMAL HIGH (ref 65–99)
Potassium: 3.8 mmol/L (ref 3.5–5.3)
Sodium: 139 mmol/L (ref 135–146)
Total Bilirubin: 0.3 mg/dL (ref 0.2–1.2)
Total Protein: 6.9 g/dL (ref 6.1–8.1)
eGFR: 68 mL/min/{1.73_m2} (ref >=60)

## 2021-02-15 LAB — HIV-1 RNA QUANT-NO REFLEX-BLD
HIV 1 RNA Quant: NOT DETECTED Copies/mL
HIV-1 RNA Quant, Log: NOT DETECTED Log cps/mL

## 2021-02-15 IMAGING — DX DG CHEST 1V PORT
1 series · 1 of 1 positions shown · non-contrast
Comparison: 01/05/2020

CLINICAL DATA: Hyperglycemia. STEMI on EKG.

EXAM:
PORTABLE CHEST 1 VIEW

[chest ap]
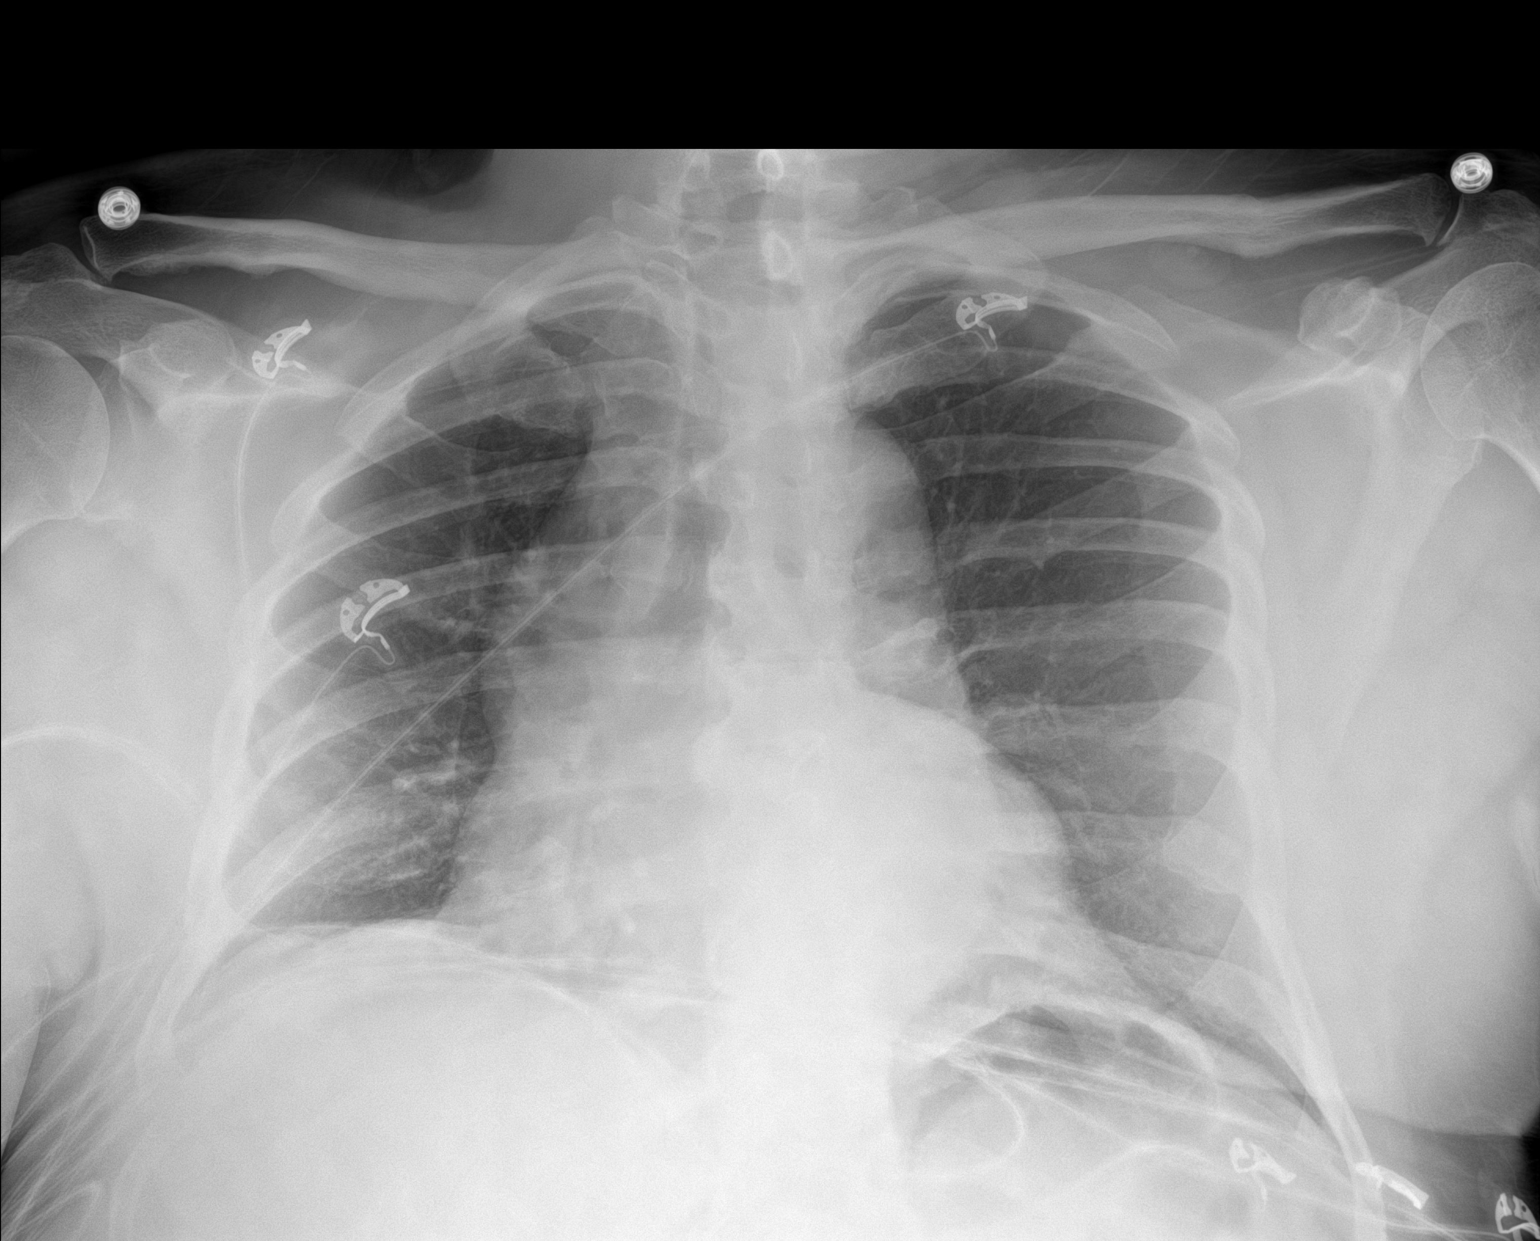

[1 of 1 positions shown; findings below may reference images not displayed]

FINDINGS: Stable mild cardiomegaly. Unchanged mediastinal contours with aortic
tortuosity. Minor bibasilar atelectasis. No pulmonary edema. No
confluent consolidation. No pleural fluid or pneumothorax. No acute
osseous abnormalities are seen.
IMPRESSION: Stable mild cardiomegaly. Minor bibasilar atelectasis.

## 2021-02-17 IMAGING — US US RENAL
1 series · 14 of 25 positions shown · non-contrast
Comparison: Ultrasound 10/21/2018

CLINICAL DATA: Acute kidney injury

EXAM:
RENAL / URINARY TRACT ULTRASOUND COMPLETE

[Series 1: us renal · 14 of 38 slices shown]
[im 1/38]
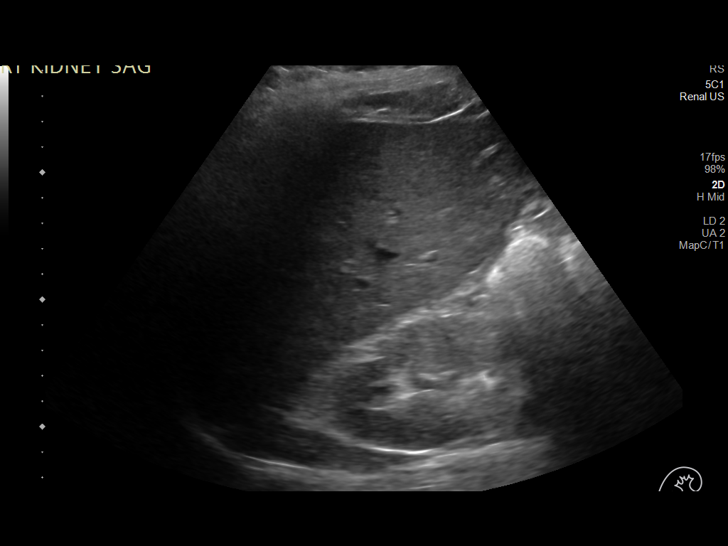
[im 4/38]
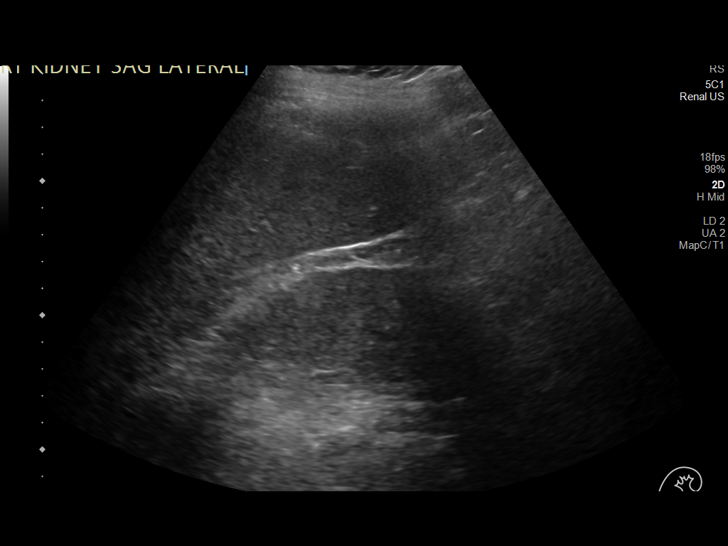
[im 7/38]
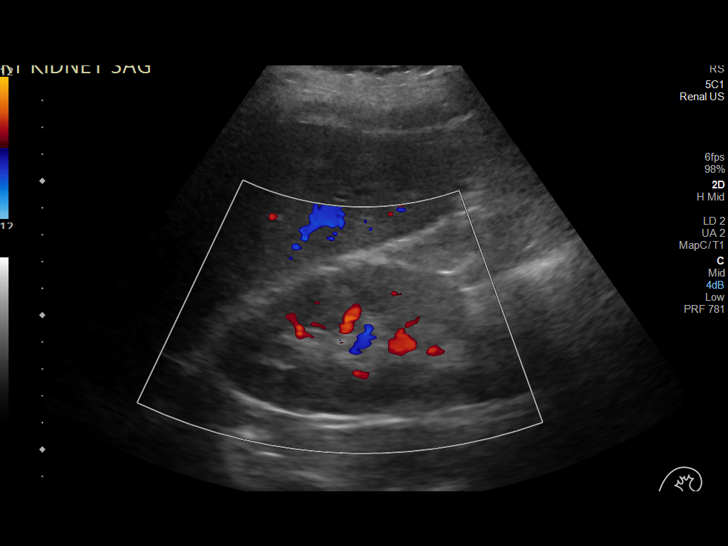
[im 10/38]
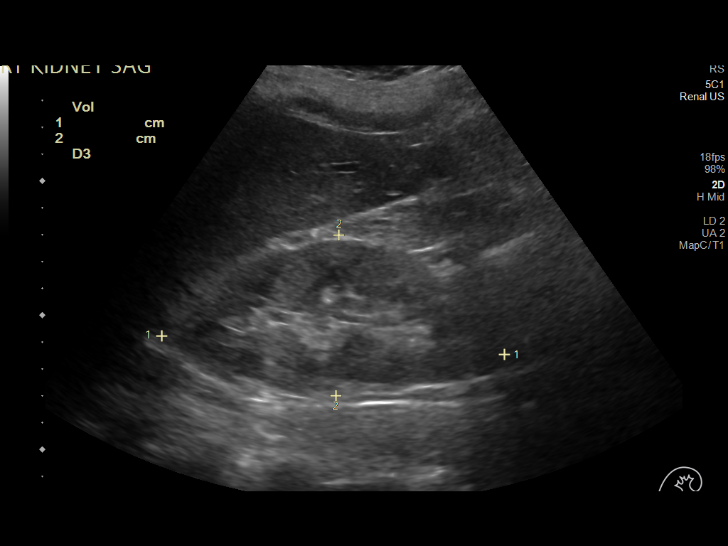
[im 13/38]
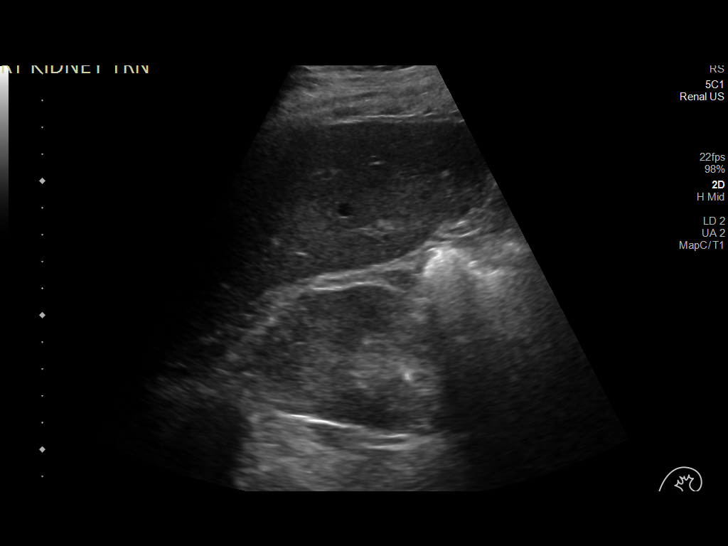
[im 14/38]
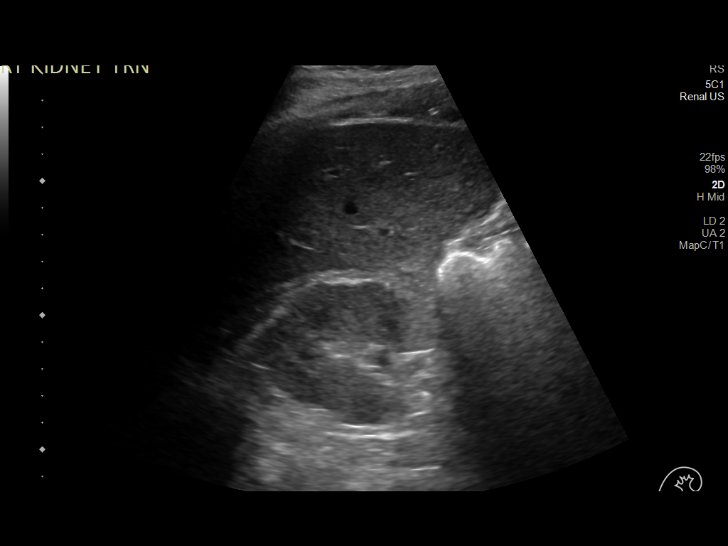
[im 17/38]
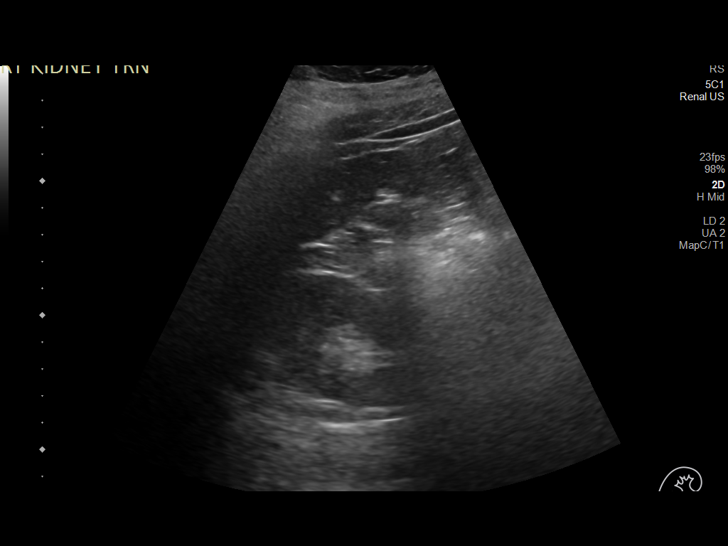
[im 21/38]
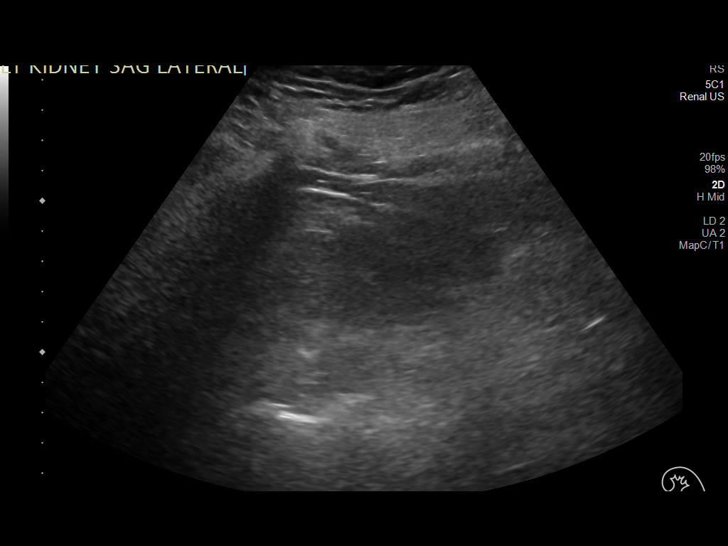
[im 24/38]
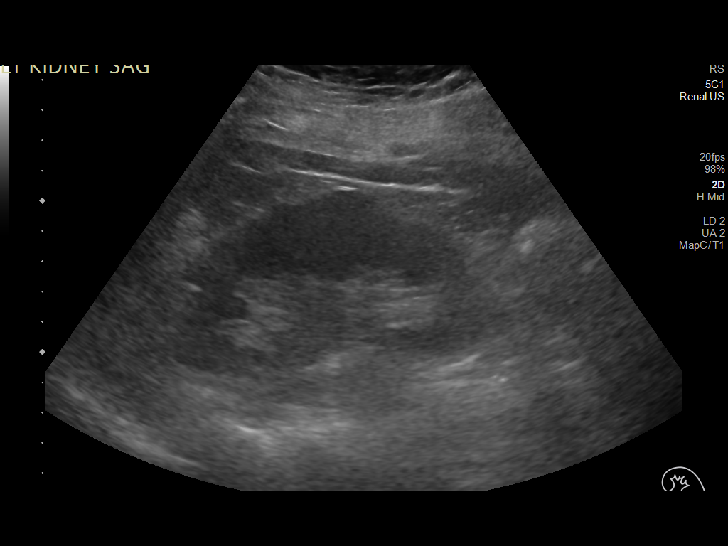
[im 25/38]
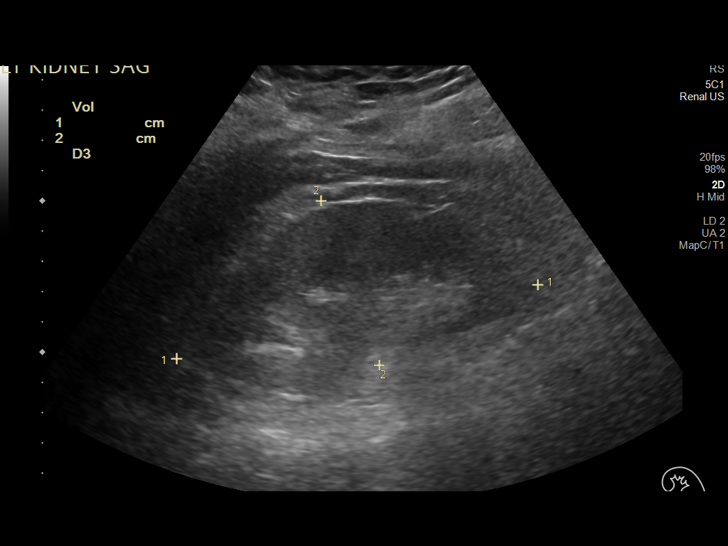
[im 28/38]
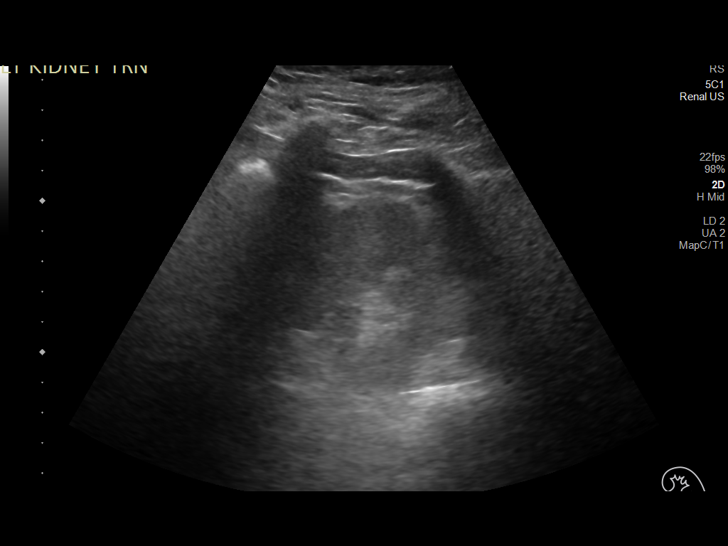
[im 31/38]
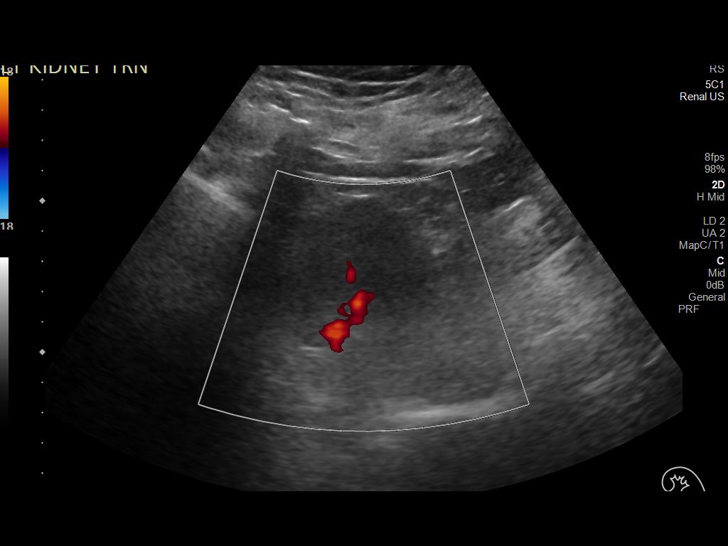
[im 34/38]
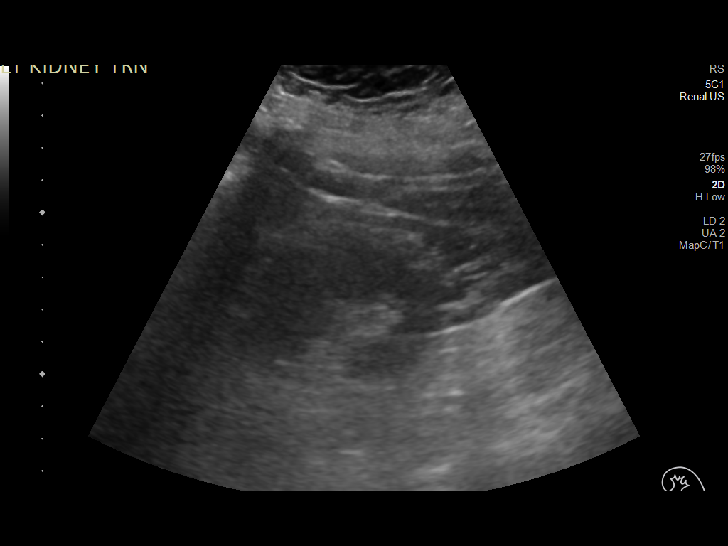
[im 38/38]
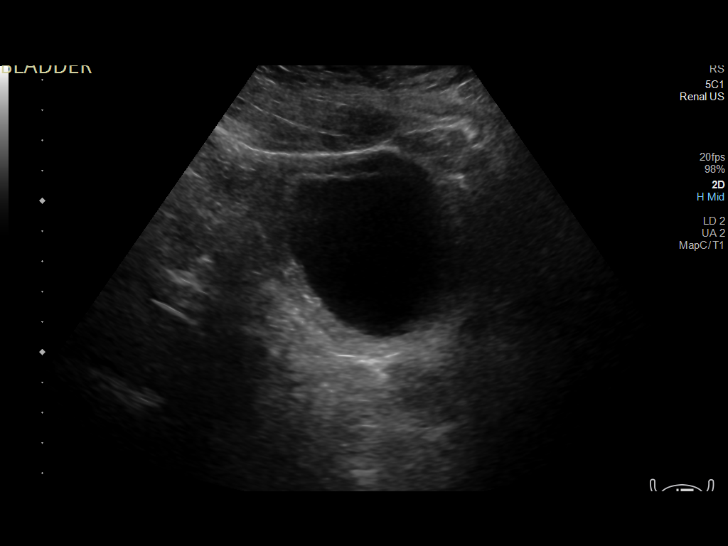

[14 of 25 positions shown; findings below may reference images not displayed]

FINDINGS: Right Kidney:

Renal measurements: 12.8 x 6 x 5.2 cm = volume: 207.3 mL. Cortex is
slightly echogenic. No mass or hydronephrosis.

Left Kidney:

Renal measurements: 12.2 x 5.8 x 5.2 cm = volume: 192.1 mL. Cortex
appears slightly echogenic. No mass or hydronephrosis.

Bladder:

Appears normal for degree of bladder distention.

Other:

None.
IMPRESSION: 1. Slightly echogenic kidneys suggestive of medical renal disease.
2. No hydronephrosis

## 2021-02-20 DIAGNOSIS — Z0000 Encounter for general adult medical examination without abnormal findings: Secondary | ICD-10-CM | POA: Diagnosis not present

## 2021-02-20 DIAGNOSIS — Z79899 Other long term (current) drug therapy: Secondary | ICD-10-CM | POA: Diagnosis not present

## 2021-02-20 DIAGNOSIS — G546 Phantom limb syndrome with pain: Secondary | ICD-10-CM | POA: Diagnosis not present

## 2021-02-20 DIAGNOSIS — I10 Essential (primary) hypertension: Secondary | ICD-10-CM | POA: Diagnosis not present

## 2021-02-20 DIAGNOSIS — R030 Elevated blood-pressure reading, without diagnosis of hypertension: Secondary | ICD-10-CM | POA: Diagnosis not present

## 2021-02-20 DIAGNOSIS — Z6841 Body Mass Index (BMI) 40.0 and over, adult: Secondary | ICD-10-CM | POA: Diagnosis not present

## 2021-02-20 DIAGNOSIS — S78112A Complete traumatic amputation at level between left hip and knee, initial encounter: Secondary | ICD-10-CM | POA: Diagnosis not present

## 2021-02-21 DIAGNOSIS — T8131XA Disruption of external operation (surgical) wound, not elsewhere classified, initial encounter: Secondary | ICD-10-CM | POA: Diagnosis not present

## 2021-02-21 DIAGNOSIS — I739 Peripheral vascular disease, unspecified: Secondary | ICD-10-CM | POA: Diagnosis not present

## 2021-02-21 DIAGNOSIS — M6281 Muscle weakness (generalized): Secondary | ICD-10-CM | POA: Diagnosis not present

## 2021-02-27 DIAGNOSIS — E669 Obesity, unspecified: Secondary | ICD-10-CM | POA: Diagnosis not present

## 2021-02-27 DIAGNOSIS — I2510 Atherosclerotic heart disease of native coronary artery without angina pectoris: Secondary | ICD-10-CM | POA: Diagnosis not present

## 2021-02-27 DIAGNOSIS — D689 Coagulation defect, unspecified: Secondary | ICD-10-CM | POA: Diagnosis not present

## 2021-02-28 DIAGNOSIS — M6281 Muscle weakness (generalized): Secondary | ICD-10-CM | POA: Diagnosis not present

## 2021-02-28 DIAGNOSIS — I739 Peripheral vascular disease, unspecified: Secondary | ICD-10-CM | POA: Diagnosis not present

## 2021-02-28 DIAGNOSIS — T8131XA Disruption of external operation (surgical) wound, not elsewhere classified, initial encounter: Secondary | ICD-10-CM | POA: Diagnosis not present

## 2021-03-07 ENCOUNTER — Ambulatory Visit: Payer: Medicare HMO | Admitting: Family

## 2021-03-07 DIAGNOSIS — E118 Type 2 diabetes mellitus with unspecified complications: Secondary | ICD-10-CM | POA: Diagnosis not present

## 2021-03-07 DIAGNOSIS — I6930 Unspecified sequelae of cerebral infarction: Secondary | ICD-10-CM | POA: Diagnosis not present

## 2021-03-07 DIAGNOSIS — T8131XA Disruption of external operation (surgical) wound, not elsewhere classified, initial encounter: Secondary | ICD-10-CM | POA: Diagnosis not present

## 2021-03-07 DIAGNOSIS — I739 Peripheral vascular disease, unspecified: Secondary | ICD-10-CM | POA: Diagnosis not present

## 2021-03-07 DIAGNOSIS — M6281 Muscle weakness (generalized): Secondary | ICD-10-CM | POA: Diagnosis not present

## 2021-03-07 DIAGNOSIS — R262 Difficulty in walking, not elsewhere classified: Secondary | ICD-10-CM | POA: Diagnosis not present

## 2021-03-07 DIAGNOSIS — R2689 Other abnormalities of gait and mobility: Secondary | ICD-10-CM | POA: Diagnosis not present

## 2021-03-08 DIAGNOSIS — R262 Difficulty in walking, not elsewhere classified: Secondary | ICD-10-CM | POA: Diagnosis not present

## 2021-03-08 DIAGNOSIS — M6281 Muscle weakness (generalized): Secondary | ICD-10-CM | POA: Diagnosis not present

## 2021-03-08 DIAGNOSIS — I6930 Unspecified sequelae of cerebral infarction: Secondary | ICD-10-CM | POA: Diagnosis not present

## 2021-03-08 DIAGNOSIS — E118 Type 2 diabetes mellitus with unspecified complications: Secondary | ICD-10-CM | POA: Diagnosis not present

## 2021-03-08 DIAGNOSIS — I739 Peripheral vascular disease, unspecified: Secondary | ICD-10-CM | POA: Diagnosis not present

## 2021-03-08 DIAGNOSIS — R2689 Other abnormalities of gait and mobility: Secondary | ICD-10-CM | POA: Diagnosis not present

## 2021-03-09 DIAGNOSIS — E118 Type 2 diabetes mellitus with unspecified complications: Secondary | ICD-10-CM | POA: Diagnosis not present

## 2021-03-09 DIAGNOSIS — R2689 Other abnormalities of gait and mobility: Secondary | ICD-10-CM | POA: Diagnosis not present

## 2021-03-09 DIAGNOSIS — M6281 Muscle weakness (generalized): Secondary | ICD-10-CM | POA: Diagnosis not present

## 2021-03-09 DIAGNOSIS — R262 Difficulty in walking, not elsewhere classified: Secondary | ICD-10-CM | POA: Diagnosis not present

## 2021-03-09 DIAGNOSIS — I6930 Unspecified sequelae of cerebral infarction: Secondary | ICD-10-CM | POA: Diagnosis not present

## 2021-03-09 DIAGNOSIS — I739 Peripheral vascular disease, unspecified: Secondary | ICD-10-CM | POA: Diagnosis not present

## 2021-03-11 DIAGNOSIS — I739 Peripheral vascular disease, unspecified: Secondary | ICD-10-CM | POA: Diagnosis not present

## 2021-03-11 DIAGNOSIS — I6930 Unspecified sequelae of cerebral infarction: Secondary | ICD-10-CM | POA: Diagnosis not present

## 2021-03-11 DIAGNOSIS — R262 Difficulty in walking, not elsewhere classified: Secondary | ICD-10-CM | POA: Diagnosis not present

## 2021-03-11 DIAGNOSIS — M6281 Muscle weakness (generalized): Secondary | ICD-10-CM | POA: Diagnosis not present

## 2021-03-11 DIAGNOSIS — R2689 Other abnormalities of gait and mobility: Secondary | ICD-10-CM | POA: Diagnosis not present

## 2021-03-11 DIAGNOSIS — E118 Type 2 diabetes mellitus with unspecified complications: Secondary | ICD-10-CM | POA: Diagnosis not present

## 2021-03-12 DIAGNOSIS — I6930 Unspecified sequelae of cerebral infarction: Secondary | ICD-10-CM | POA: Diagnosis not present

## 2021-03-12 DIAGNOSIS — I739 Peripheral vascular disease, unspecified: Secondary | ICD-10-CM | POA: Diagnosis not present

## 2021-03-12 DIAGNOSIS — M6281 Muscle weakness (generalized): Secondary | ICD-10-CM | POA: Diagnosis not present

## 2021-03-12 DIAGNOSIS — E118 Type 2 diabetes mellitus with unspecified complications: Secondary | ICD-10-CM | POA: Diagnosis not present

## 2021-03-12 DIAGNOSIS — R2689 Other abnormalities of gait and mobility: Secondary | ICD-10-CM | POA: Diagnosis not present

## 2021-03-12 DIAGNOSIS — R262 Difficulty in walking, not elsewhere classified: Secondary | ICD-10-CM | POA: Diagnosis not present

## 2021-03-13 DIAGNOSIS — Z6841 Body Mass Index (BMI) 40.0 and over, adult: Secondary | ICD-10-CM | POA: Diagnosis not present

## 2021-03-13 DIAGNOSIS — Z79899 Other long term (current) drug therapy: Secondary | ICD-10-CM | POA: Diagnosis not present

## 2021-03-13 DIAGNOSIS — I10 Essential (primary) hypertension: Secondary | ICD-10-CM | POA: Diagnosis not present

## 2021-03-13 DIAGNOSIS — E1129 Type 2 diabetes mellitus with other diabetic kidney complication: Secondary | ICD-10-CM | POA: Diagnosis not present

## 2021-03-13 DIAGNOSIS — R030 Elevated blood-pressure reading, without diagnosis of hypertension: Secondary | ICD-10-CM | POA: Diagnosis not present

## 2021-03-13 DIAGNOSIS — E7800 Pure hypercholesterolemia, unspecified: Secondary | ICD-10-CM | POA: Diagnosis not present

## 2021-03-13 DIAGNOSIS — E1165 Type 2 diabetes mellitus with hyperglycemia: Secondary | ICD-10-CM | POA: Diagnosis not present

## 2021-03-14 ENCOUNTER — Other Ambulatory Visit: Payer: Self-pay | Admitting: Family

## 2021-03-14 DIAGNOSIS — M6281 Muscle weakness (generalized): Secondary | ICD-10-CM | POA: Diagnosis not present

## 2021-03-14 DIAGNOSIS — R69 Illness, unspecified: Secondary | ICD-10-CM | POA: Diagnosis not present

## 2021-03-14 DIAGNOSIS — R262 Difficulty in walking, not elsewhere classified: Secondary | ICD-10-CM | POA: Diagnosis not present

## 2021-03-14 DIAGNOSIS — I739 Peripheral vascular disease, unspecified: Secondary | ICD-10-CM | POA: Diagnosis not present

## 2021-03-14 DIAGNOSIS — E118 Type 2 diabetes mellitus with unspecified complications: Secondary | ICD-10-CM | POA: Diagnosis not present

## 2021-03-14 DIAGNOSIS — F419 Anxiety disorder, unspecified: Secondary | ICD-10-CM

## 2021-03-14 DIAGNOSIS — A419 Sepsis, unspecified organism: Secondary | ICD-10-CM | POA: Diagnosis not present

## 2021-03-14 DIAGNOSIS — I10 Essential (primary) hypertension: Secondary | ICD-10-CM | POA: Diagnosis not present

## 2021-03-14 DIAGNOSIS — I6930 Unspecified sequelae of cerebral infarction: Secondary | ICD-10-CM | POA: Diagnosis not present

## 2021-03-14 DIAGNOSIS — R2689 Other abnormalities of gait and mobility: Secondary | ICD-10-CM | POA: Diagnosis not present

## 2021-03-14 DIAGNOSIS — E119 Type 2 diabetes mellitus without complications: Secondary | ICD-10-CM | POA: Diagnosis not present

## 2021-03-15 ENCOUNTER — Encounter: Payer: Self-pay | Admitting: Orthopedic Surgery

## 2021-03-15 ENCOUNTER — Ambulatory Visit (INDEPENDENT_AMBULATORY_CARE_PROVIDER_SITE_OTHER): Payer: Medicare HMO | Admitting: Orthopedic Surgery

## 2021-03-15 DIAGNOSIS — Z89612 Acquired absence of left leg above knee: Secondary | ICD-10-CM

## 2021-03-15 DIAGNOSIS — E119 Type 2 diabetes mellitus without complications: Secondary | ICD-10-CM | POA: Diagnosis not present

## 2021-03-15 DIAGNOSIS — Z89511 Acquired absence of right leg below knee: Secondary | ICD-10-CM

## 2021-03-15 DIAGNOSIS — I10 Essential (primary) hypertension: Secondary | ICD-10-CM | POA: Diagnosis not present

## 2021-03-15 DIAGNOSIS — B20 Human immunodeficiency virus [HIV] disease: Secondary | ICD-10-CM | POA: Diagnosis not present

## 2021-03-15 DIAGNOSIS — R69 Illness, unspecified: Secondary | ICD-10-CM | POA: Diagnosis not present

## 2021-03-15 DIAGNOSIS — E876 Hypokalemia: Secondary | ICD-10-CM | POA: Diagnosis not present

## 2021-03-15 NOTE — Progress Notes (Signed)
Patient is status post left above-the-knee amputation.  He is 2 and half months status post amputation.  Patient states skilled nursing lost his prescription.  Patient was given a new prescription for a new socket on the right for the right below-knee amputation and a prescription for a new prosthesis for his acute left above-the-knee amputation for biotech.  Patient is a new left transfemoral amputee.  Patient's current comorbidities are not expected to impact the ability to function with the prescribed prosthesis. Patient verbally communicates a strong desire to use a prosthesis. Patient currently requires mobility aids to ambulate without a prosthesis.  Expects not to use mobility aids with a new prosthesis.  Patient is a K2 level ambulator that will use a prosthesis to walk around their home and the community over low level environmental barriers.   Patient is an existing right transtibial  amputee.  Patient's current comorbidities are not expected to impact the ability to function with the prescribed prosthesis. Patient verbally communicates a strong desire to use a prosthesis. Patient currently requires mobility aids to ambulate without a prosthesis.  Expects not to use mobility aids with a new prosthesis.  Patient is a K2 level ambulator that will use a prosthesis to walk around their home and the community over low level environmental barriers.

## 2021-03-16 DIAGNOSIS — M6281 Muscle weakness (generalized): Secondary | ICD-10-CM | POA: Diagnosis not present

## 2021-03-16 DIAGNOSIS — I739 Peripheral vascular disease, unspecified: Secondary | ICD-10-CM | POA: Diagnosis not present

## 2021-03-16 DIAGNOSIS — R262 Difficulty in walking, not elsewhere classified: Secondary | ICD-10-CM | POA: Diagnosis not present

## 2021-03-16 DIAGNOSIS — I6930 Unspecified sequelae of cerebral infarction: Secondary | ICD-10-CM | POA: Diagnosis not present

## 2021-03-16 DIAGNOSIS — E118 Type 2 diabetes mellitus with unspecified complications: Secondary | ICD-10-CM | POA: Diagnosis not present

## 2021-03-16 DIAGNOSIS — R2689 Other abnormalities of gait and mobility: Secondary | ICD-10-CM | POA: Diagnosis not present

## 2021-03-19 DIAGNOSIS — M6281 Muscle weakness (generalized): Secondary | ICD-10-CM | POA: Diagnosis not present

## 2021-03-19 DIAGNOSIS — I6930 Unspecified sequelae of cerebral infarction: Secondary | ICD-10-CM | POA: Diagnosis not present

## 2021-03-19 DIAGNOSIS — I739 Peripheral vascular disease, unspecified: Secondary | ICD-10-CM | POA: Diagnosis not present

## 2021-03-19 DIAGNOSIS — R2689 Other abnormalities of gait and mobility: Secondary | ICD-10-CM | POA: Diagnosis not present

## 2021-03-19 DIAGNOSIS — E118 Type 2 diabetes mellitus with unspecified complications: Secondary | ICD-10-CM | POA: Diagnosis not present

## 2021-03-19 DIAGNOSIS — R262 Difficulty in walking, not elsewhere classified: Secondary | ICD-10-CM | POA: Diagnosis not present

## 2021-03-20 DIAGNOSIS — E118 Type 2 diabetes mellitus with unspecified complications: Secondary | ICD-10-CM | POA: Diagnosis not present

## 2021-03-20 DIAGNOSIS — I6930 Unspecified sequelae of cerebral infarction: Secondary | ICD-10-CM | POA: Diagnosis not present

## 2021-03-20 DIAGNOSIS — R2689 Other abnormalities of gait and mobility: Secondary | ICD-10-CM | POA: Diagnosis not present

## 2021-03-20 DIAGNOSIS — M6281 Muscle weakness (generalized): Secondary | ICD-10-CM | POA: Diagnosis not present

## 2021-03-20 DIAGNOSIS — I739 Peripheral vascular disease, unspecified: Secondary | ICD-10-CM | POA: Diagnosis not present

## 2021-03-20 DIAGNOSIS — R262 Difficulty in walking, not elsewhere classified: Secondary | ICD-10-CM | POA: Diagnosis not present

## 2021-03-21 DIAGNOSIS — I160 Hypertensive urgency: Secondary | ICD-10-CM | POA: Diagnosis not present

## 2021-03-21 DIAGNOSIS — B20 Human immunodeficiency virus [HIV] disease: Secondary | ICD-10-CM | POA: Diagnosis not present

## 2021-03-21 DIAGNOSIS — I129 Hypertensive chronic kidney disease with stage 1 through stage 4 chronic kidney disease, or unspecified chronic kidney disease: Secondary | ICD-10-CM | POA: Diagnosis not present

## 2021-03-21 DIAGNOSIS — T8131XA Disruption of external operation (surgical) wound, not elsewhere classified, initial encounter: Secondary | ICD-10-CM | POA: Diagnosis not present

## 2021-03-21 DIAGNOSIS — R69 Illness, unspecified: Secondary | ICD-10-CM | POA: Diagnosis not present

## 2021-03-21 DIAGNOSIS — I739 Peripheral vascular disease, unspecified: Secondary | ICD-10-CM | POA: Diagnosis not present

## 2021-03-21 DIAGNOSIS — R2689 Other abnormalities of gait and mobility: Secondary | ICD-10-CM | POA: Diagnosis not present

## 2021-03-21 DIAGNOSIS — E118 Type 2 diabetes mellitus with unspecified complications: Secondary | ICD-10-CM | POA: Diagnosis not present

## 2021-03-21 DIAGNOSIS — N183 Chronic kidney disease, stage 3 unspecified: Secondary | ICD-10-CM | POA: Diagnosis not present

## 2021-03-21 DIAGNOSIS — M6281 Muscle weakness (generalized): Secondary | ICD-10-CM | POA: Diagnosis not present

## 2021-03-21 DIAGNOSIS — R262 Difficulty in walking, not elsewhere classified: Secondary | ICD-10-CM | POA: Diagnosis not present

## 2021-03-21 DIAGNOSIS — I6930 Unspecified sequelae of cerebral infarction: Secondary | ICD-10-CM | POA: Diagnosis not present

## 2021-03-22 DIAGNOSIS — I6930 Unspecified sequelae of cerebral infarction: Secondary | ICD-10-CM | POA: Diagnosis not present

## 2021-03-22 DIAGNOSIS — M6281 Muscle weakness (generalized): Secondary | ICD-10-CM | POA: Diagnosis not present

## 2021-03-22 DIAGNOSIS — I739 Peripheral vascular disease, unspecified: Secondary | ICD-10-CM | POA: Diagnosis not present

## 2021-03-22 DIAGNOSIS — E118 Type 2 diabetes mellitus with unspecified complications: Secondary | ICD-10-CM | POA: Diagnosis not present

## 2021-03-22 DIAGNOSIS — R262 Difficulty in walking, not elsewhere classified: Secondary | ICD-10-CM | POA: Diagnosis not present

## 2021-03-22 DIAGNOSIS — R2689 Other abnormalities of gait and mobility: Secondary | ICD-10-CM | POA: Diagnosis not present

## 2021-03-22 IMAGING — MR MR MRA HEAD W/O CM
1 series · 17 of 48 positions shown · IV contrast (gadavist)
Comparison: Plain head CT 4337 hours today, and earlier.

CLINICAL DATA: 54-year-old male code stroke presentation. Left side
weakness, slurred speech.

EXAM:
MRI HEAD WITHOUT CONTRAST
MRA HEAD WITHOUT CONTRAST
MRA NECK WITHOUT AND WITH CONTRAST
TECHNIQUE: Multiplanar, multiecho pulse sequences of the brain and surrounding
structures were obtained without intravenous contrast. Angiographic
images of the Circle of Willis were obtained using MRA technique
without intravenous contrast. Angiographic images of the neck were
obtained using MRA technique without and with intravenous contrast.
Carotid stenosis measurements (when applicable) are obtained
utilizing NASCET criteria, using the distal internal carotid
diameter as the denominator.
CONTRAST:  9mL GADAVIST GADOBUTROL 1 MMOL/ML IV SOLN

[Series 5: 3d cow · axial · 0.5mm · 0.41mm/px · z∈[-55,+25]mm · 17 of 172 slices shown]
[im 1/172]
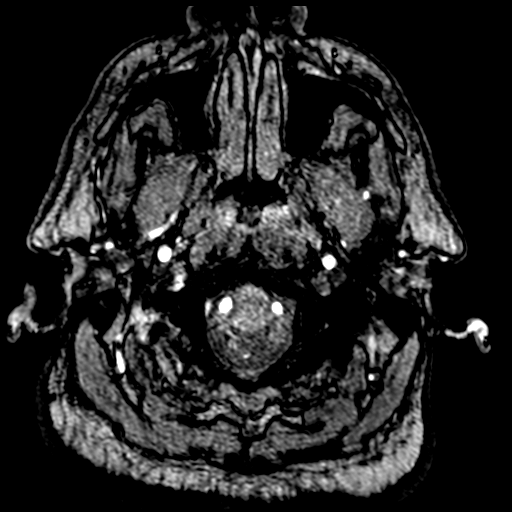
[im 4/172]
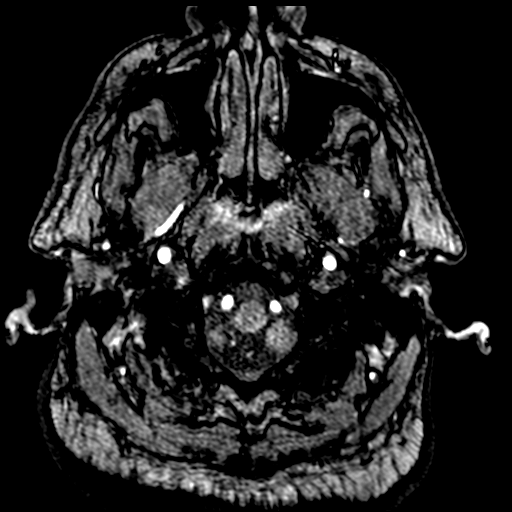
[im 8/172]
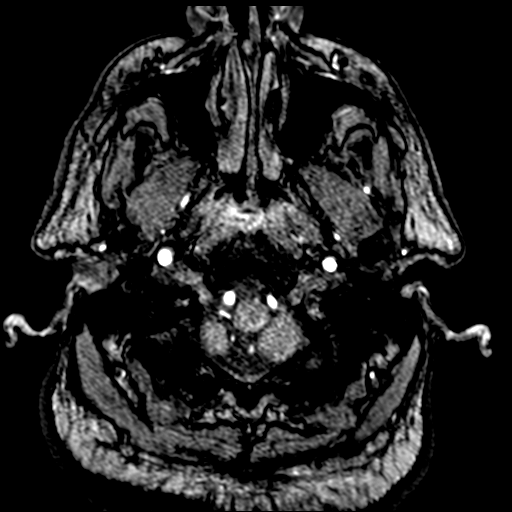
[im 11/172]
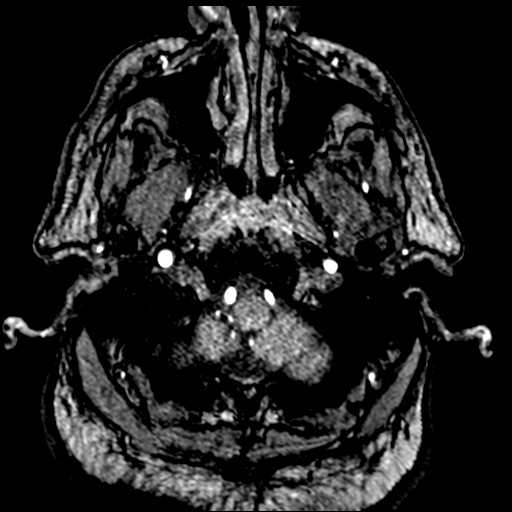
[im 15/172]
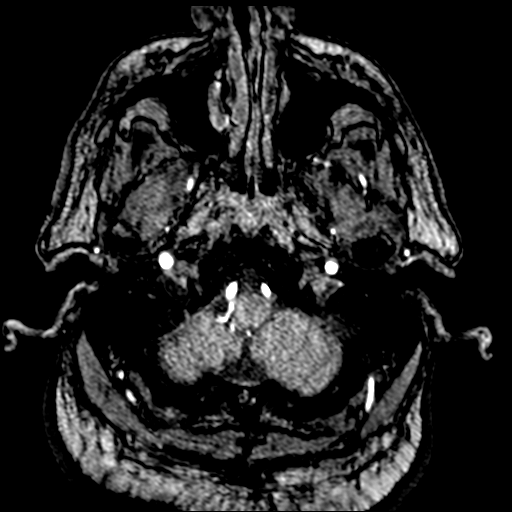
[im 19/172]
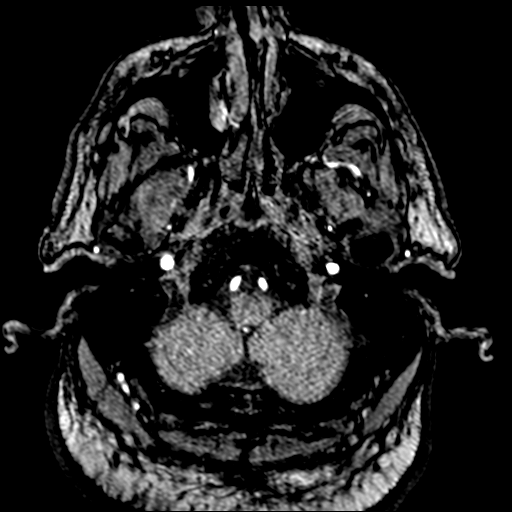
[im 22/172]
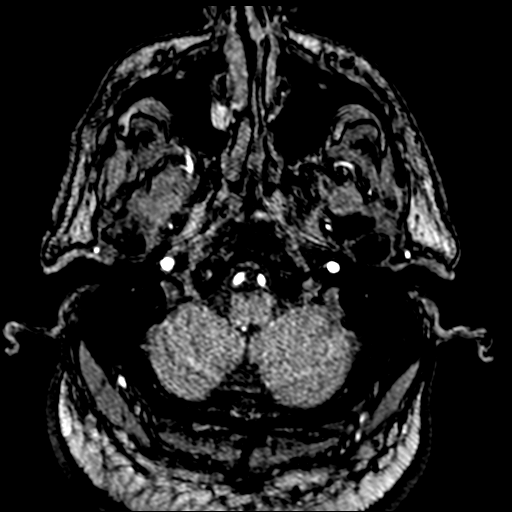
[im 30/172]
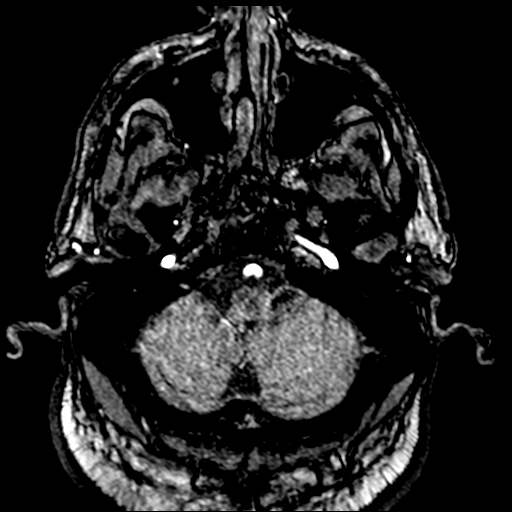
[im 33/172]
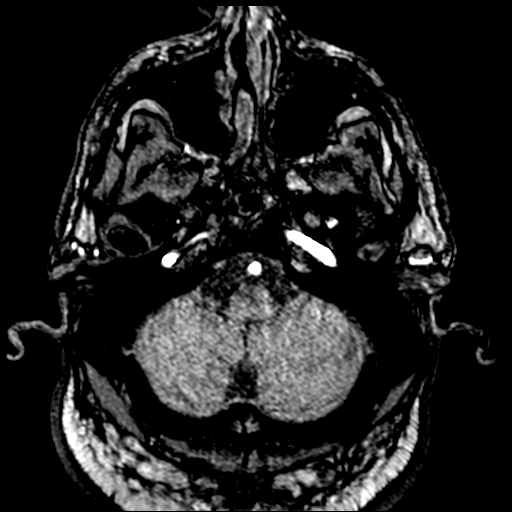
[im 55/172]
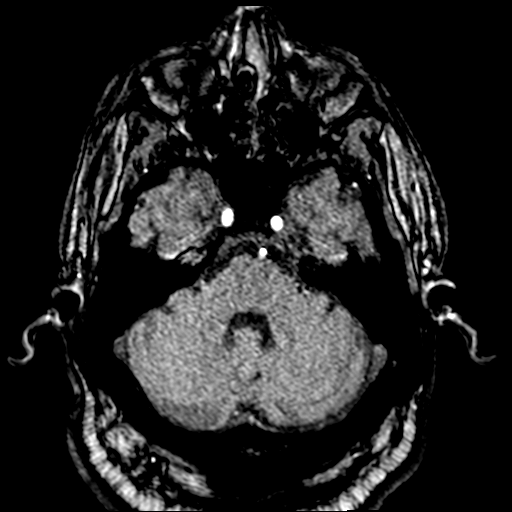
[im 77/172]
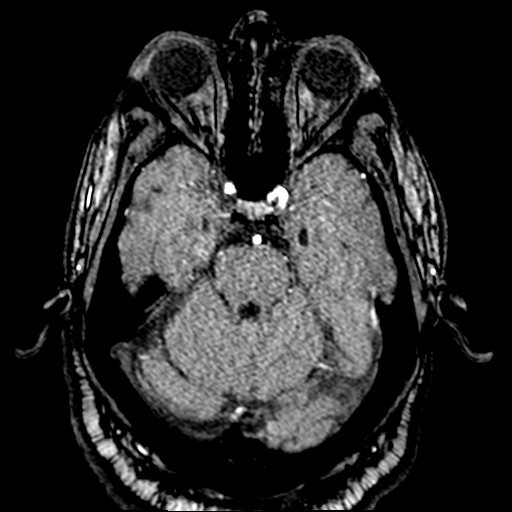
[im 88/172]
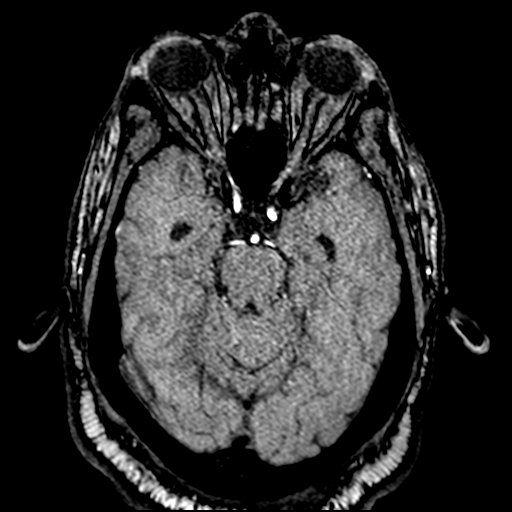
[im 99/172]
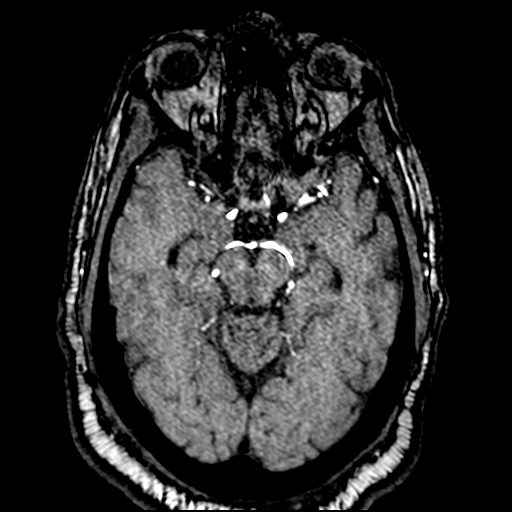
[im 121/172]
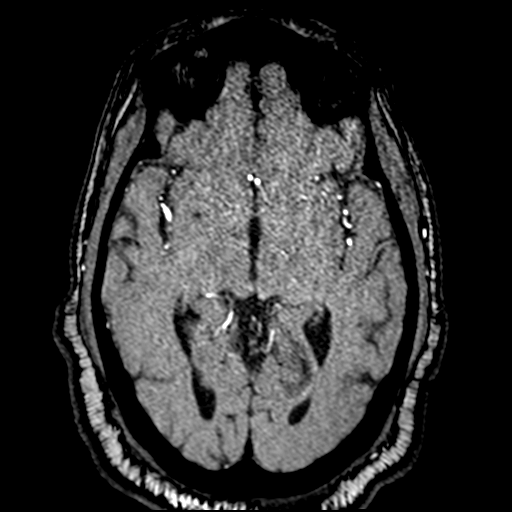
[im 142/172]
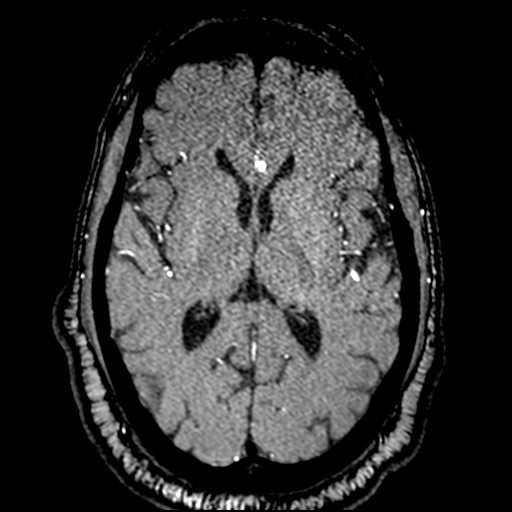
[im 146/172]
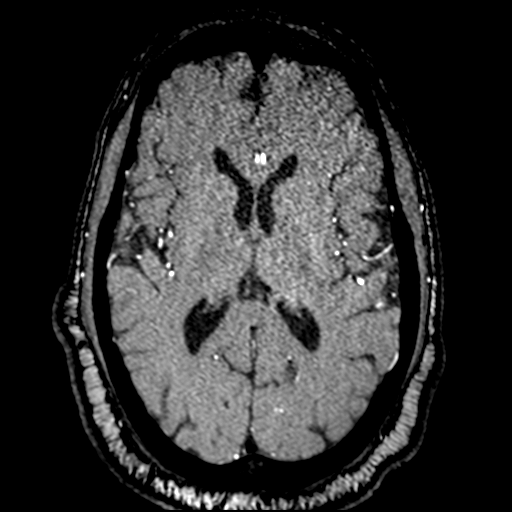
[im 164/172]
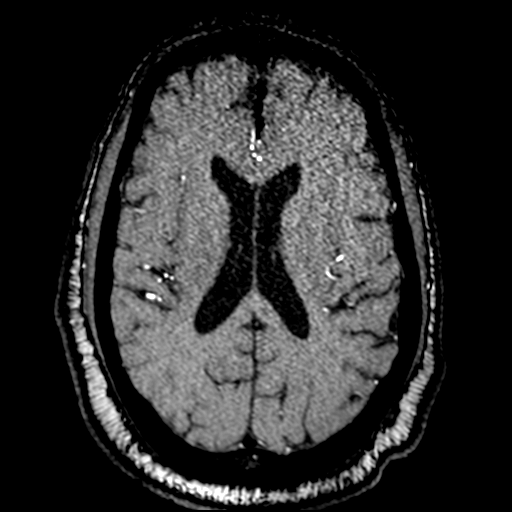

[17 of 48 positions shown; findings below may reference images not displayed]

FINDINGS: MRI HEAD FINDINGS

Brain: Questionable punctate/vague diffusion restriction in the
right pons (series 7, image 52). Nearby there is a chronic appearing
right paracentral pontine lacunar infarct (series 10, image 65).

Otherwise No restricted diffusion to suggest acute infarction.

No midline shift, mass effect, evidence of mass lesion,
ventriculomegaly, extra-axial collection or acute intracranial
hemorrhage. Cervicomedullary junction and pituitary are within
normal limits.

Outside of the right pons gray and white matter signal is largely
normal for age throughout the brain. No cortical encephalomalacia or
chronic cerebral blood products identified.

Vascular: Major intracranial vascular flow voids are preserved.

Skull and upper cervical spine: Negative.

Sinuses/Orbits: Postoperative changes to both globes, bilateral
chronic lamina papyracea fractures. Otherwise negative.

Other: Mastoids are clear. Visible internal auditory structures
appear normal. Visible scalp and face appear negative.

MRA NECK FINDINGS

Precontrast time-of-flight images demonstrate antegrade flow in both
cervical carotid and vertebral arteries. The right vertebral artery
appears slightly dominant. Carotid bifurcations appear within normal
limits.

Post-contrast neck MRA images reveal a 3 vessel arch configuration.
Tortuous brachiocephalic artery.

Normal right CCA aside from mild proximal tortuosity. Right carotid
bifurcation is widely patent. Minimal irregularity of the proximal
right ICA. Otherwise negative cervical right ICA.

Suggestion of mild to moderate supraclinoid ICA stenosis (series
8393, image 3). Patent right ICA terminus.

Normal left CCA and left carotid bifurcation aside from some
tortuosity. Cervical left ICA and visible left ICA siphon appear
negative.

Proximal subclavian arteries are patent without stenosis. Normal
vertebral artery origins. Fairly codominant vertebral arteries are
patent and within normal limits to the skull base.

MRA HEAD FINDINGS

Antegrade flow in the posterior circulation. Right V4 segment
appears mildly dominant. Normal distal vertebral arteries and
vertebrobasilar junction. Patent right PICA origin. The left AICA is
patent and may be dominant.

There is mild to moderate irregularity of the basilar artery
although no significant stenosis (series 9521, image 11). Patent SCA
and PCA origins. Posterior communicating arteries are diminutive or
absent. Bilateral PCA branches are within normal limits.

Antegrade flow in both ICA siphons. Normal left siphon. The right
siphon appears normal to the supraclinoid segment where mild to
moderate irregularity and mild stenosis is redemonstrated (series
9657, image 6). Patent bilateral carotid termini. Ophthalmic artery
origins appear normal. Patent MCA and ACA origins. Diminutive
anterior communicating artery. There is mild stenosis of the distal
right ACA A2 segment. Visible left ACA branches are within normal
limits.

Left MCA M1 segment and trifurcation are patent without stenosis.
Right MCA M1 segment and bifurcation are patent without stenosis.
Visible bilateral MCA branches are within normal limits.
IMPRESSION: 1. Suspicion of subtle acute small vessel ischemia in the Right
Pons, with a nearby chronic right paracentral pontine lacune (see
also #3). No associated hemorrhage or mass effect.

2. Otherwise normal for age non-contrast MRI appearance of the
brain.

3. MRA Head and Neck is positive for evidence of atherosclerosis in
the:
- mid Basilar Artery (mild stenosis), at the
- distal Right ICA siphon (mild-to-moderate stenosis), and
- Right ACA A2 segment (mild stenosis).
No other posterior circulation stenosis. No cervical carotid
stenosis.

## 2021-03-22 IMAGING — MR MR HEAD W/O CM
12 of 13 series · 44 of 48 positions shown · IV contrast (gadavist)
Comparison: Plain head CT 4337 hours today, and earlier.

CLINICAL DATA: 54-year-old male code stroke presentation. Left side
weakness, slurred speech.

EXAM:
MRI HEAD WITHOUT CONTRAST
MRA HEAD WITHOUT CONTRAST
MRA NECK WITHOUT AND WITH CONTRAST
TECHNIQUE: Multiplanar, multiecho pulse sequences of the brain and surrounding
structures were obtained without intravenous contrast. Angiographic
images of the Circle of Willis were obtained using MRA technique
without intravenous contrast. Angiographic images of the neck were
obtained using MRA technique without and with intravenous contrast.
Carotid stenosis measurements (when applicable) are obtained
utilizing NASCET criteria, using the distal internal carotid
diameter as the denominator.
CONTRAST:  9mL GADAVIST GADOBUTROL 1 MMOL/ML IV SOLN

[Series 5: DWI · axial · 3.0mm · 0.88mm/px · z∈[-53,+90]mm · 8 of 100 slices shown (1 of 4)]
[im 1/100]
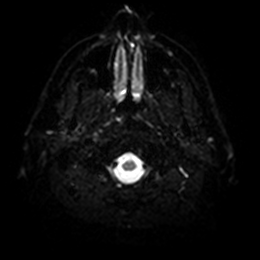
[im 15/100]
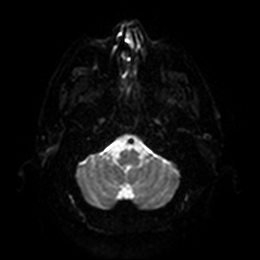
[im 29/100]
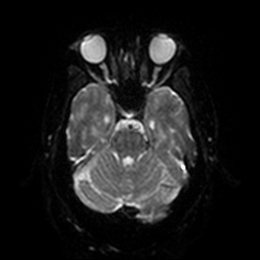
[im 43/100]
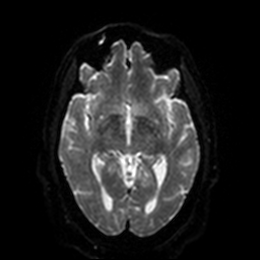
[im 57/100]
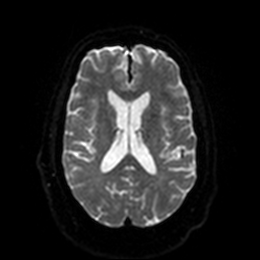
[im 71/100]
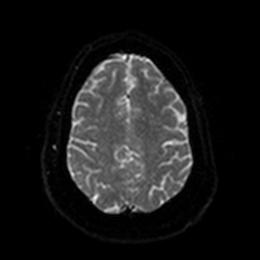
[im 85/100]
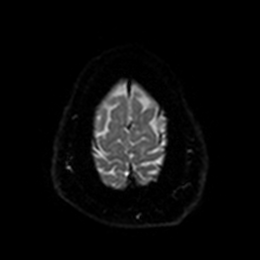
[im 100/100]
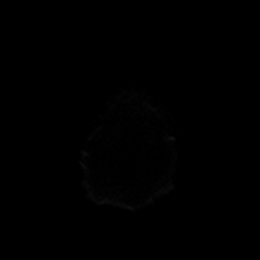

[Series 6: DWI · axial · 3.0mm · 0.88mm/px · z∈[-53,+90]mm · 4 of 50 slices shown (2 of 4)]
[im 1/50]
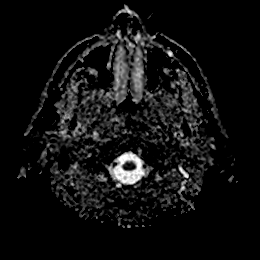
[im 17/50]
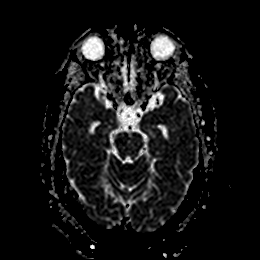
[im 33/50]
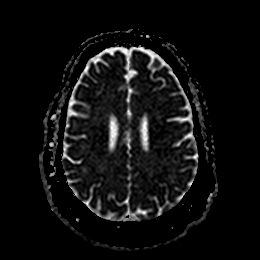
[im 50/50]
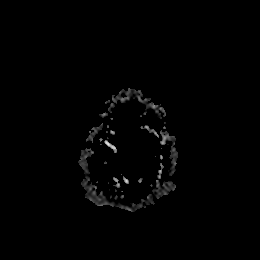

[Series 7: DWI · coronal · 4.0mm · 0.88mm/px · 5 of 68 slices shown (3 of 4)]
[im 1/68]
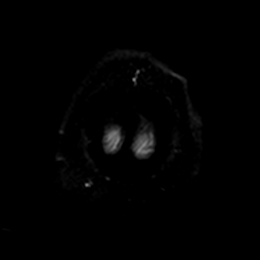
[im 17/68]
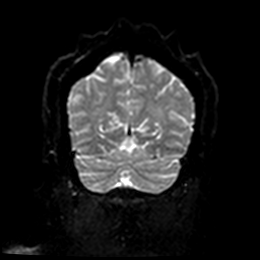
[im 34/68]
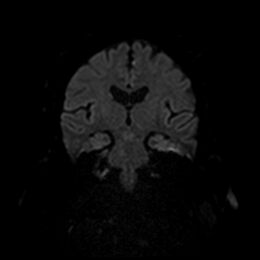
[im 51/68]
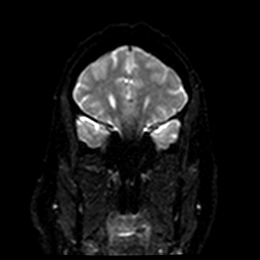
[im 68/68]
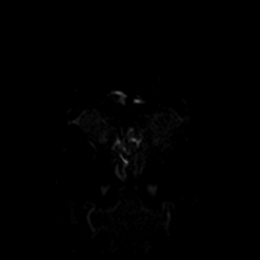

[Series 8: DWI · coronal · 4.0mm · 0.88mm/px · 3 of 34 slices shown (4 of 4)]
[im 1/34]
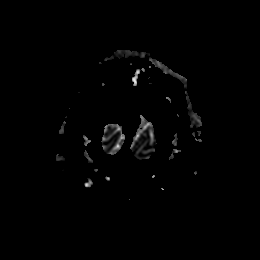
[im 17/34]
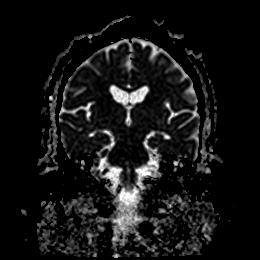
[im 34/34]
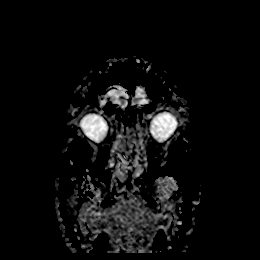

[Series 9: T1 · sagittal · 5.0mm · 0.75mm/px · 2 of 23 slices shown]
[im 1/23]
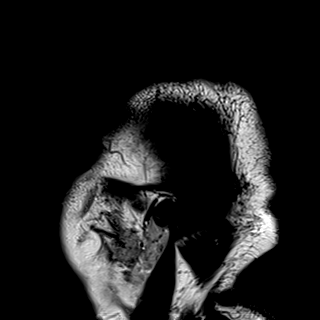
[im 23/23]
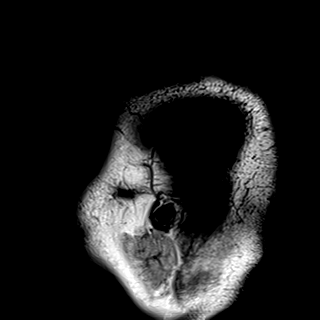

[Series 10: T2 · axial · 5.0mm · 0.72mm/px · z∈[-52,+88]mm · 2 of 25 slices shown (1 of 2)]
[im 1/25]
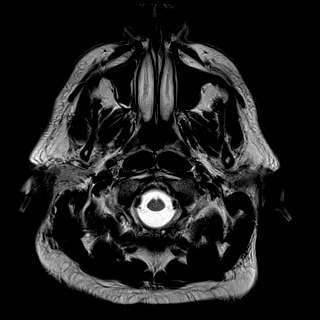
[im 25/25]
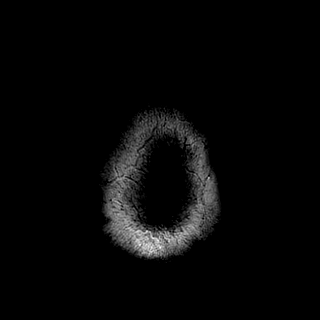

[Series 11: FLAIR · axial · 5.0mm · 0.45mm/px · z∈[-51,+90]mm · 2 of 25 slices shown]
[im 1/25]
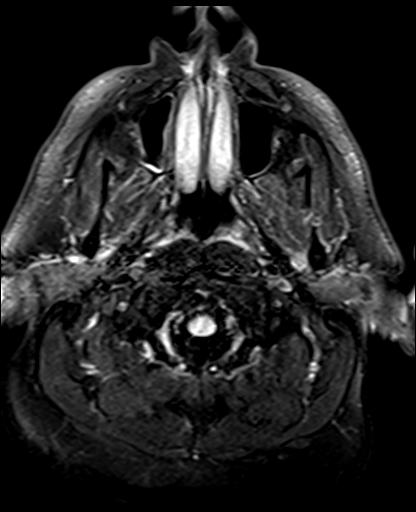
[im 25/25]
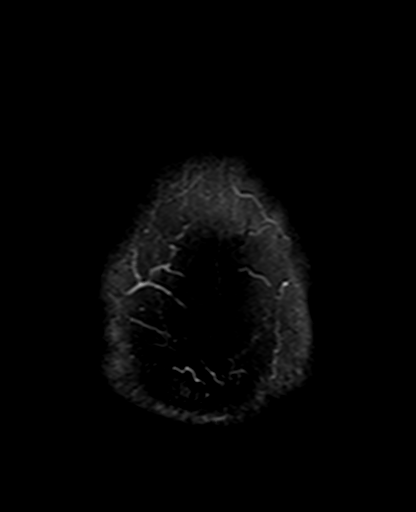

[Series 12: mag_images · axial · 3.0mm · 0.90mm/px · z∈[-55,+94]mm · 4 of 52 slices shown]
[im 1/52]
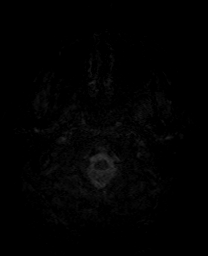
[im 18/52]
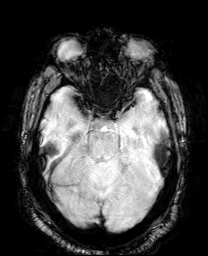
[im 35/52]
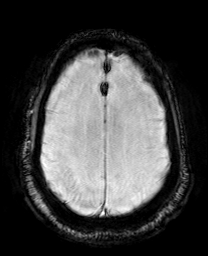
[im 52/52]
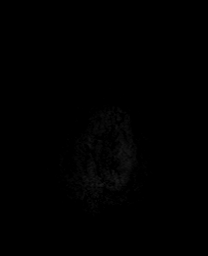

[Series 13: pha_images · axial · 3.0mm · 0.90mm/px · z∈[-55,+94]mm · 4 of 52 slices shown]
[im 1/52]
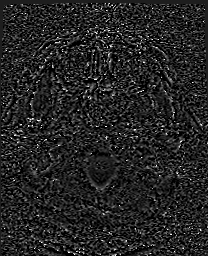
[im 18/52]
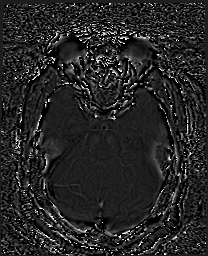
[im 35/52]
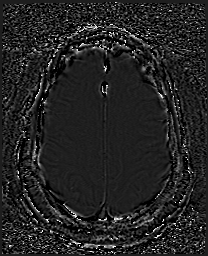
[im 52/52]
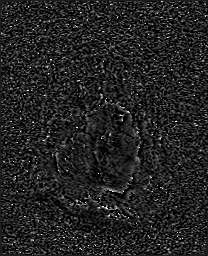

[Series 14: swi_images · axial · 3.0mm · 0.90mm/px · z∈[-55,+94]mm · 4 of 52 slices shown]
[im 1/52]
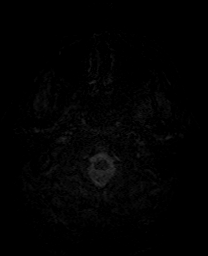
[im 18/52]
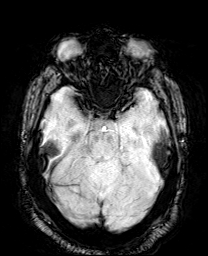
[im 35/52]
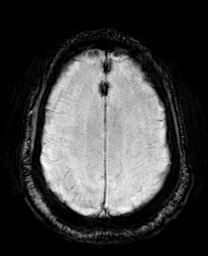
[im 52/52]
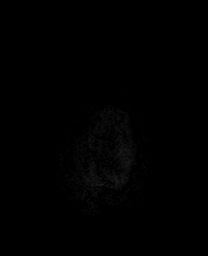

[Series 15: mip_images(sw) · axial · 24.0mm · 0.90mm/px · z∈[-45,+84]mm · 4 of 45 slices shown]
[im 1/45]
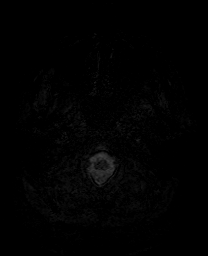
[im 15/45]
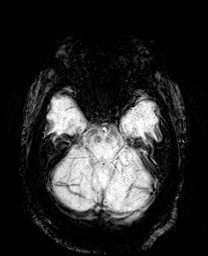
[im 30/45]
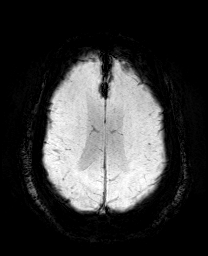
[im 45/45]
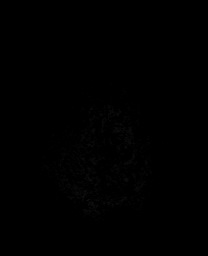

[Series 17: T2 · coronal · 5.0mm · 0.34mm/px · 2 of 29 slices shown (2 of 2)]
[im 1/29]
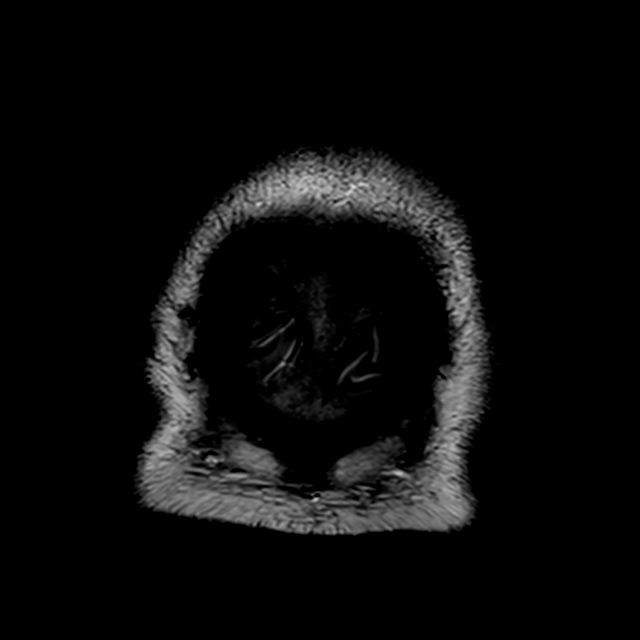
[im 29/29]
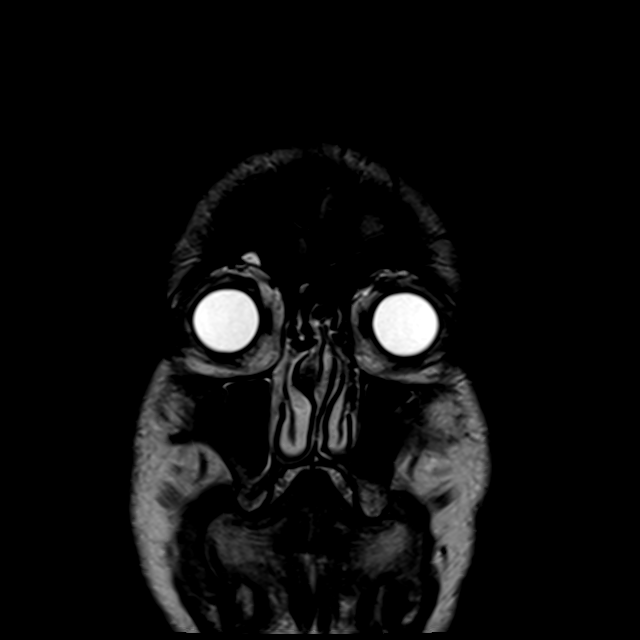

[44 of 48 positions shown; findings below may reference images not displayed]

FINDINGS: MRI HEAD FINDINGS

Brain: Questionable punctate/vague diffusion restriction in the
right pons (series 7, image 52). Nearby there is a chronic appearing
right paracentral pontine lacunar infarct (series 10, image 65).

Otherwise No restricted diffusion to suggest acute infarction.

No midline shift, mass effect, evidence of mass lesion,
ventriculomegaly, extra-axial collection or acute intracranial
hemorrhage. Cervicomedullary junction and pituitary are within
normal limits.

Outside of the right pons gray and white matter signal is largely
normal for age throughout the brain. No cortical encephalomalacia or
chronic cerebral blood products identified.

Vascular: Major intracranial vascular flow voids are preserved.

Skull and upper cervical spine: Negative.

Sinuses/Orbits: Postoperative changes to both globes, bilateral
chronic lamina papyracea fractures. Otherwise negative.

Other: Mastoids are clear. Visible internal auditory structures
appear normal. Visible scalp and face appear negative.

MRA NECK FINDINGS

Precontrast time-of-flight images demonstrate antegrade flow in both
cervical carotid and vertebral arteries. The right vertebral artery
appears slightly dominant. Carotid bifurcations appear within normal
limits.

Post-contrast neck MRA images reveal a 3 vessel arch configuration.
Tortuous brachiocephalic artery.

Normal right CCA aside from mild proximal tortuosity. Right carotid
bifurcation is widely patent. Minimal irregularity of the proximal
right ICA. Otherwise negative cervical right ICA.

Suggestion of mild to moderate supraclinoid ICA stenosis (series
8393, image 3). Patent right ICA terminus.

Normal left CCA and left carotid bifurcation aside from some
tortuosity. Cervical left ICA and visible left ICA siphon appear
negative.

Proximal subclavian arteries are patent without stenosis. Normal
vertebral artery origins. Fairly codominant vertebral arteries are
patent and within normal limits to the skull base.

MRA HEAD FINDINGS

Antegrade flow in the posterior circulation. Right V4 segment
appears mildly dominant. Normal distal vertebral arteries and
vertebrobasilar junction. Patent right PICA origin. The left AICA is
patent and may be dominant.

There is mild to moderate irregularity of the basilar artery
although no significant stenosis (series 9521, image 11). Patent SCA
and PCA origins. Posterior communicating arteries are diminutive or
absent. Bilateral PCA branches are within normal limits.

Antegrade flow in both ICA siphons. Normal left siphon. The right
siphon appears normal to the supraclinoid segment where mild to
moderate irregularity and mild stenosis is redemonstrated (series
9657, image 6). Patent bilateral carotid termini. Ophthalmic artery
origins appear normal. Patent MCA and ACA origins. Diminutive
anterior communicating artery. There is mild stenosis of the distal
right ACA A2 segment. Visible left ACA branches are within normal
limits.

Left MCA M1 segment and trifurcation are patent without stenosis.
Right MCA M1 segment and bifurcation are patent without stenosis.
Visible bilateral MCA branches are within normal limits.
IMPRESSION: 1. Suspicion of subtle acute small vessel ischemia in the Right
Pons, with a nearby chronic right paracentral pontine lacune (see
also #3). No associated hemorrhage or mass effect.

2. Otherwise normal for age non-contrast MRI appearance of the
brain.

3. MRA Head and Neck is positive for evidence of atherosclerosis in
the:
- mid Basilar Artery (mild stenosis), at the
- distal Right ICA siphon (mild-to-moderate stenosis), and
- Right ACA A2 segment (mild stenosis).
No other posterior circulation stenosis. No cervical carotid
stenosis.

## 2021-03-22 IMAGING — CT CT HEAD CODE STROKE
4 series · 16 of 47 positions shown, 18 images · non-contrast
Comparison: Prior CT from 10/11/2018.

CLINICAL DATA: Code stroke. Initial evaluation for slurred speech,
left-sided weakness. [REDACTED] side

EXAM:
CT HEAD WITHOUT CONTRAST
TECHNIQUE: Contiguous axial images were obtained from the base of the skull
through the vertex without intravenous contrast.

[Series 5: head wo · axial · 0.44mm/px · z∈[-126,+4]mm · 7 of 36 slices shown, 9 images]
[im 5/36  brain]
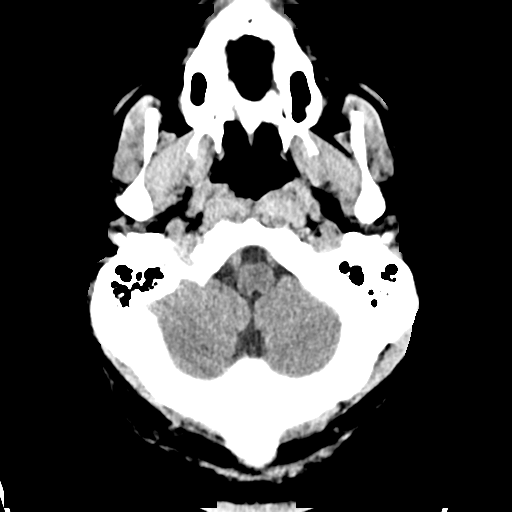
[im 5/36  bone]
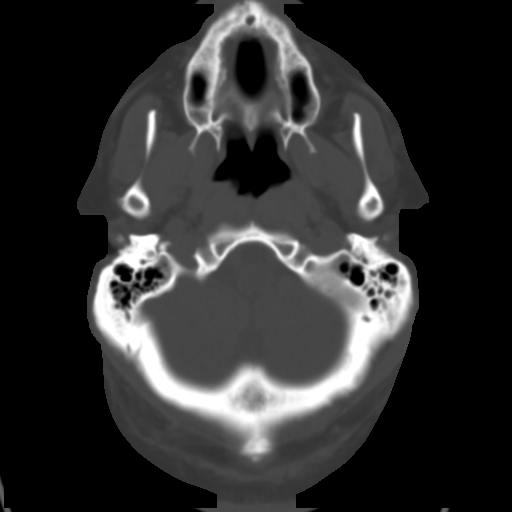
[im 9/36  brain]
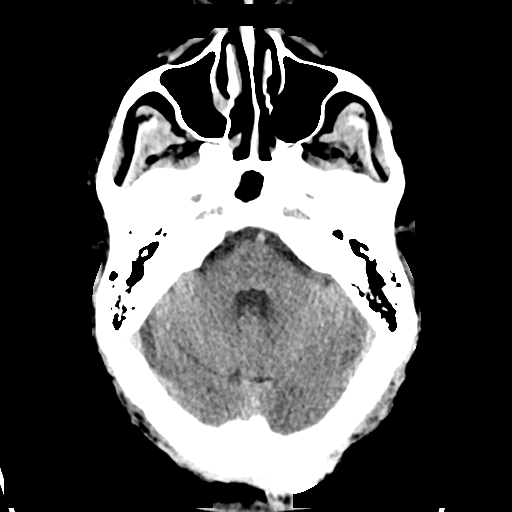
[im 14/36  brain]
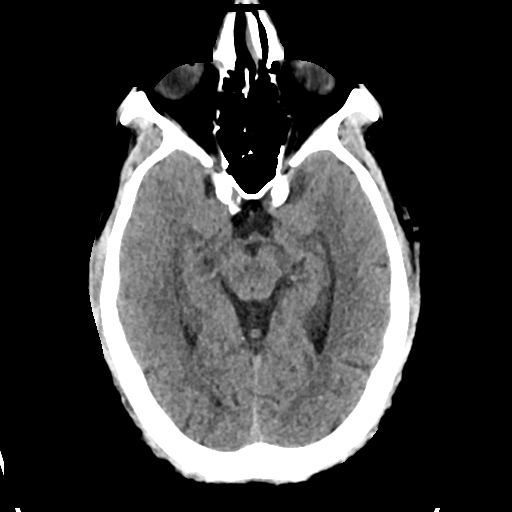
[im 18/36  brain]
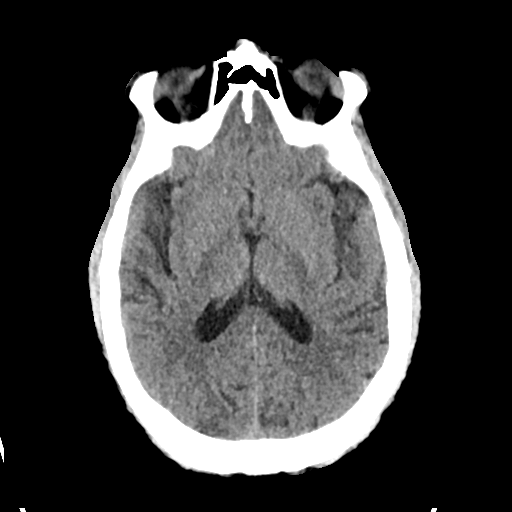
[im 22/36  brain]
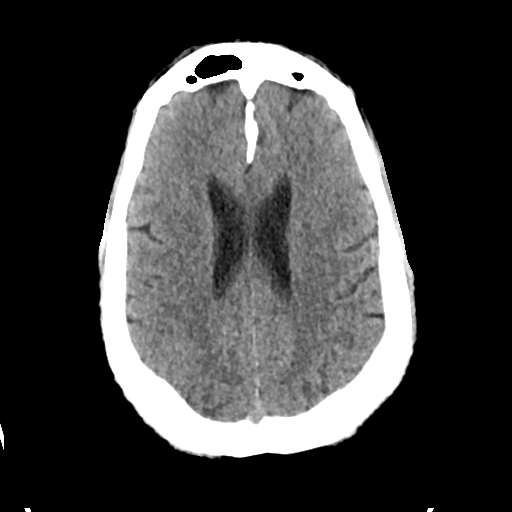
[im 22/36  bone]
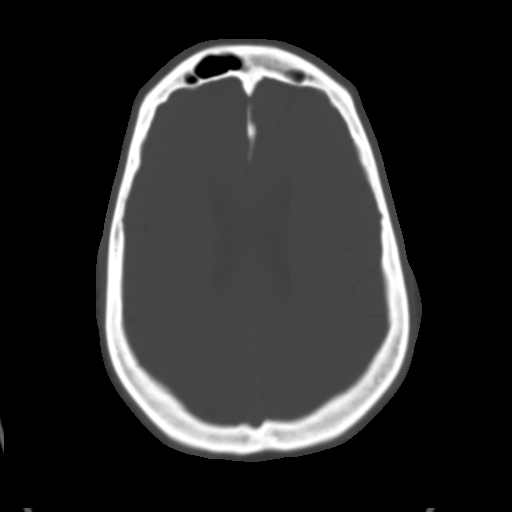
[im 27/36  brain]
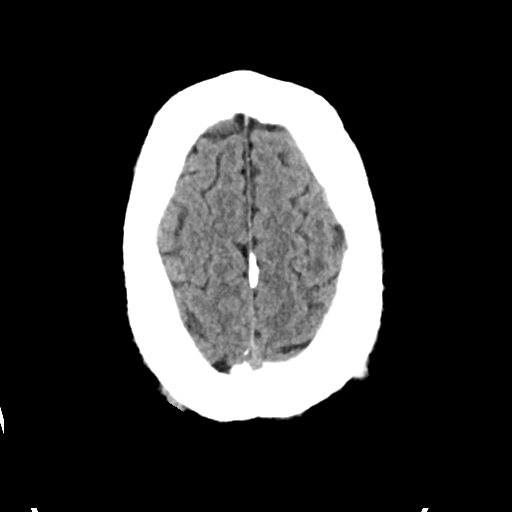
[im 31/36  brain]
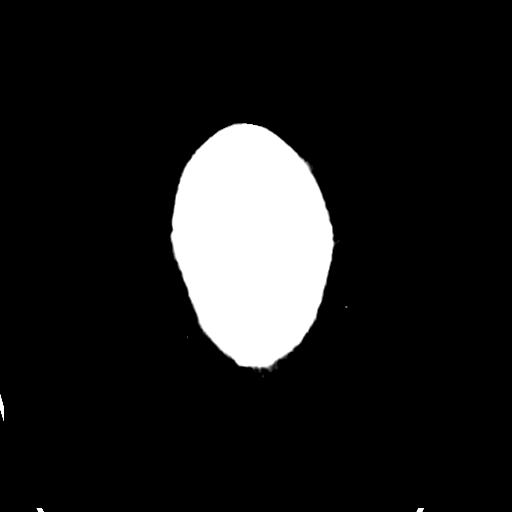

[Series 6: head bone · axial · 0.44mm/px · z∈[-130,-94]mm · 3 of 89 slices shown]
[im 9/89  bone]
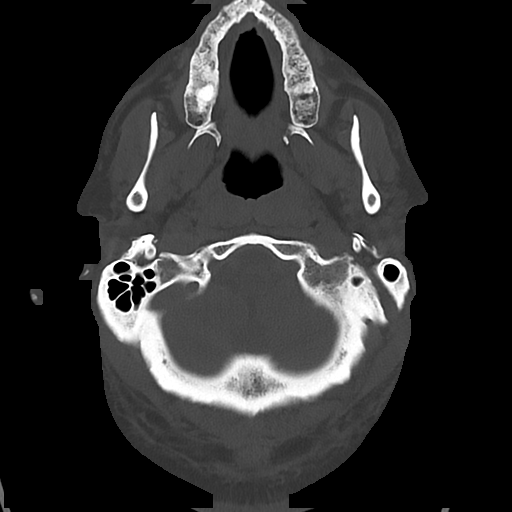
[im 18/89  bone]
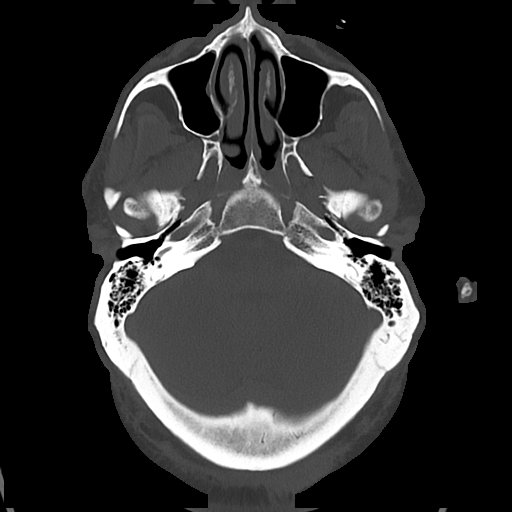
[im 27/89  bone]
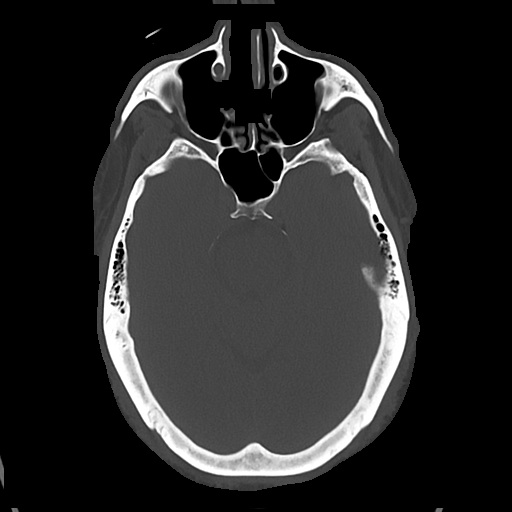

[Series 7: cor soft · coronal · 0.35mm/px · 3 of 78 slices shown]
[im 26/78  brain]
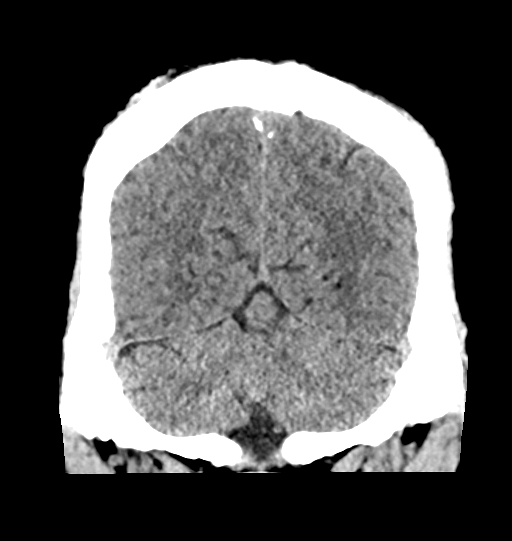
[im 35/78  brain]
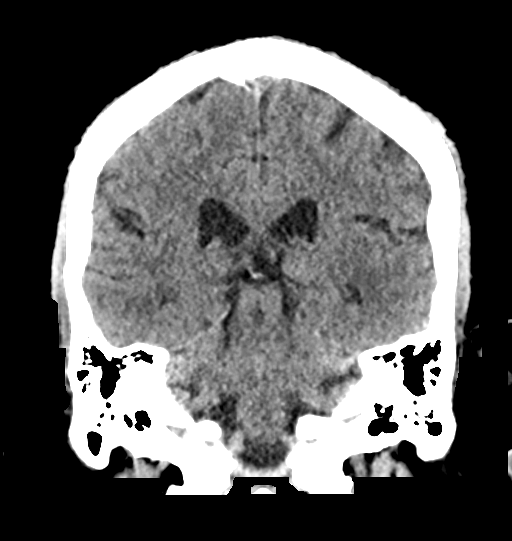
[im 43/78  brain]
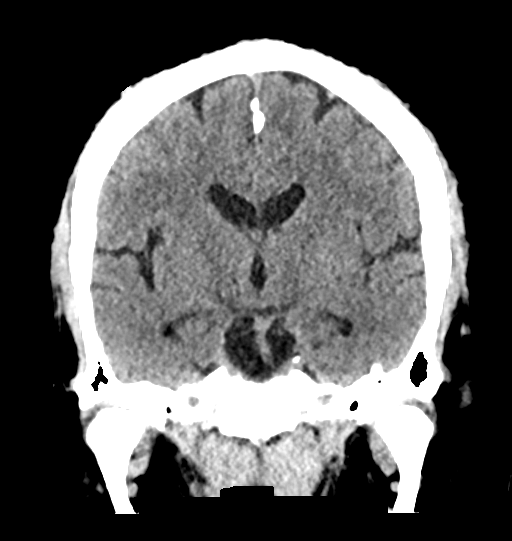

[Series 8: sag soft · sagittal · 0.37mm/px · 3 of 58 slices shown]
[im 20/58  brain]
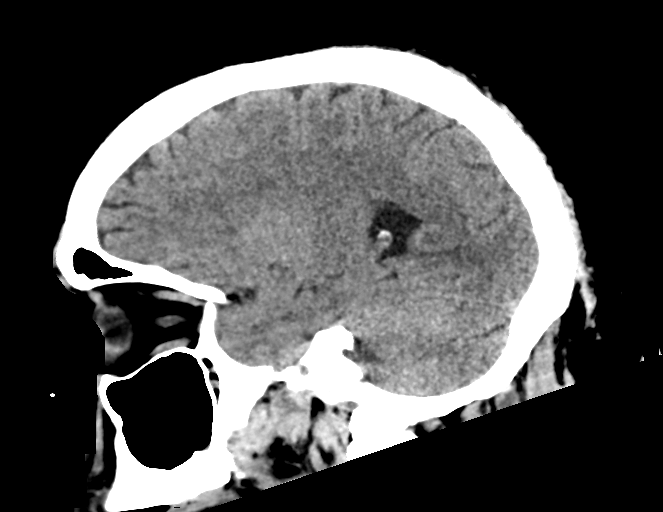
[im 29/58  brain]
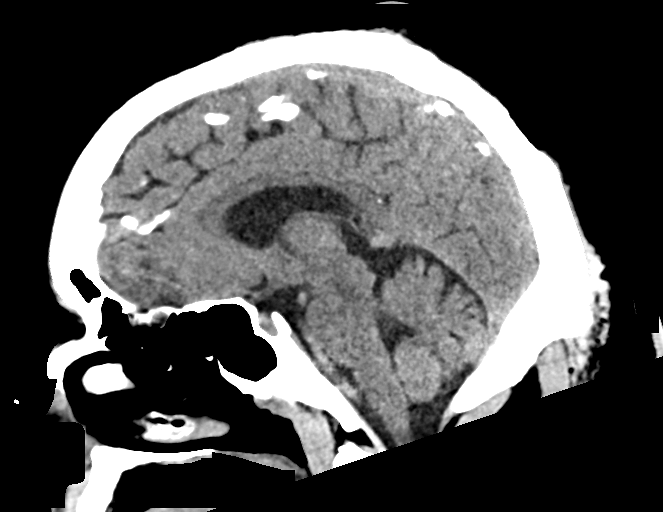
[im 39/58  brain]
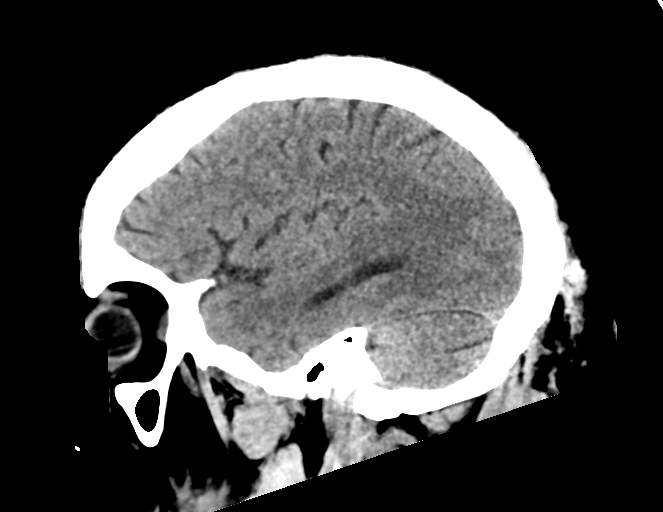

[16 of 47 positions shown; findings below may reference images not displayed]

FINDINGS: Brain: Cerebral volume within normal limits. No acute intracranial
hemorrhage. No acute large vessel territory infarct. No mass lesion,
midline shift or mass effect. No hydrocephalus or extra-axial fluid
collection.

Vascular: No hyperdense vessel.

Skull: Scalp soft tissues and calvarium within normal limits.

Sinuses/Orbits: Globes and orbital soft tissues demonstrate no acute
finding. Probable remote fractures noted involving the bilateral
lamina papyracea. Mild mucosal thickening noted about the maxillary
sinuses. Paranasal sinuses are otherwise clear. No mastoid effusion.

Other: None.

ASPECTS ([place] Stroke Program Early CT Score)

- Ganglionic level infarction (caudate, lentiform nuclei, internal
capsule, insula, M1-M3 cortex): 7

- Supraganglionic infarction (M4-M6 cortex): 3

Total score (0-10 with 10 being normal): 10
IMPRESSION: 1. No acute intracranial infarct or other abnormality.
2. ASPECTS is 10.

These results were communicated to Dr. Daveson at [DATE] [place]
04/25/2020by text page via the [employer] messaging system.

## 2021-03-23 DIAGNOSIS — I739 Peripheral vascular disease, unspecified: Secondary | ICD-10-CM | POA: Diagnosis not present

## 2021-03-23 DIAGNOSIS — M6281 Muscle weakness (generalized): Secondary | ICD-10-CM | POA: Diagnosis not present

## 2021-03-23 DIAGNOSIS — R262 Difficulty in walking, not elsewhere classified: Secondary | ICD-10-CM | POA: Diagnosis not present

## 2021-03-23 DIAGNOSIS — I6930 Unspecified sequelae of cerebral infarction: Secondary | ICD-10-CM | POA: Diagnosis not present

## 2021-03-23 DIAGNOSIS — E118 Type 2 diabetes mellitus with unspecified complications: Secondary | ICD-10-CM | POA: Diagnosis not present

## 2021-03-23 DIAGNOSIS — R2689 Other abnormalities of gait and mobility: Secondary | ICD-10-CM | POA: Diagnosis not present

## 2021-03-24 IMAGING — CT CT HEAD W/O CM
4 series · 17 of 47 positions shown, 19 images · non-contrast
Comparison: 04/25/2020

CLINICAL DATA: Left-sided weakness

EXAM:
CT HEAD WITHOUT CONTRAST
TECHNIQUE: Contiguous axial images were obtained from the base of the skull
through the vertex without intravenous contrast.

[Series 3: head without · axial · non-contrast · 0.49mm/px · z∈[-120,+10]mm · 7 of 36 slices shown, 9 images]
[im 5/36  brain]
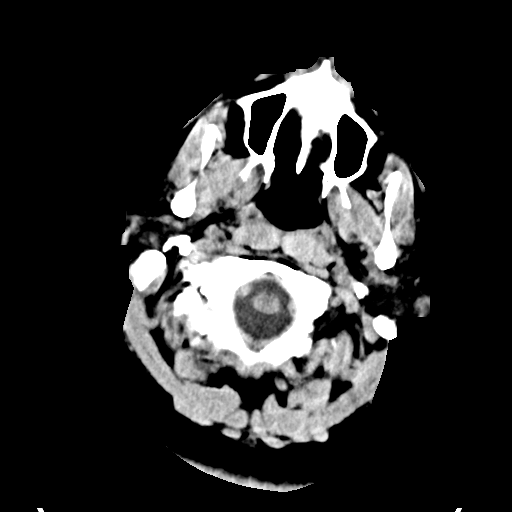
[im 5/36  bone]
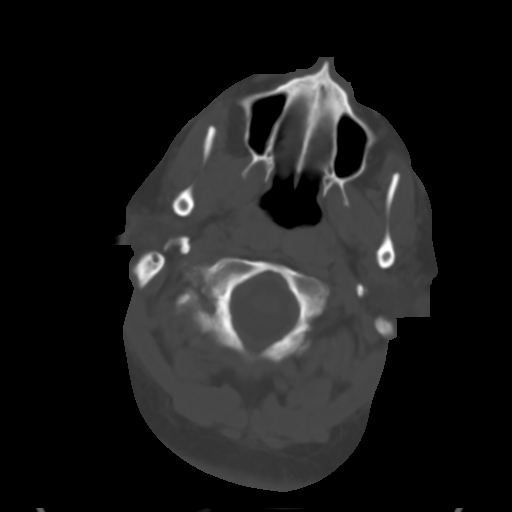
[im 9/36  brain]
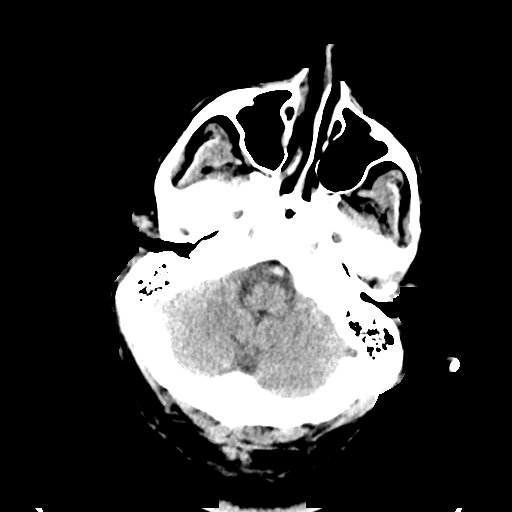
[im 14/36  brain]
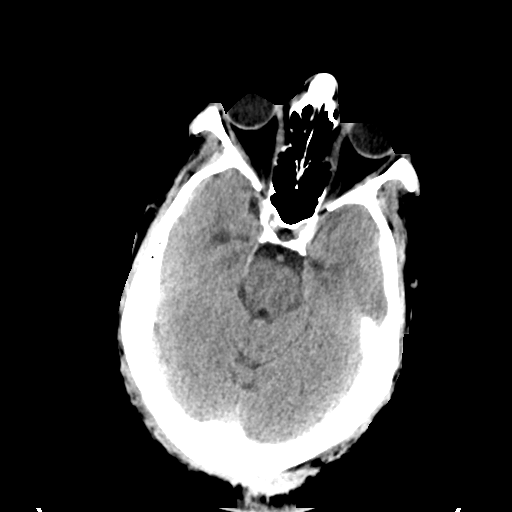
[im 18/36  brain]
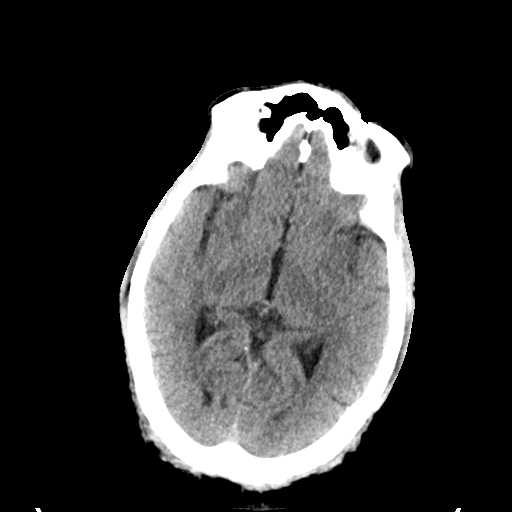
[im 22/36  brain]
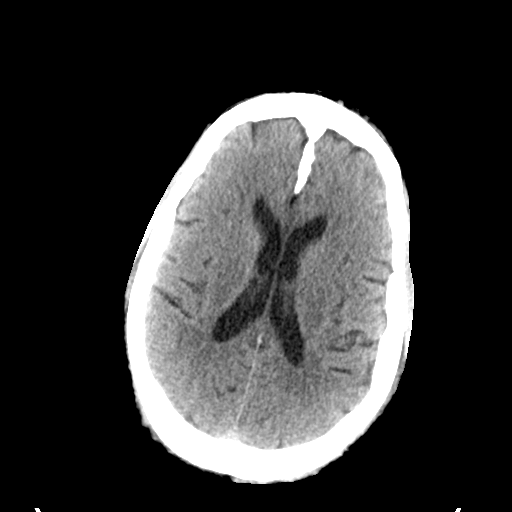
[im 22/36  bone]
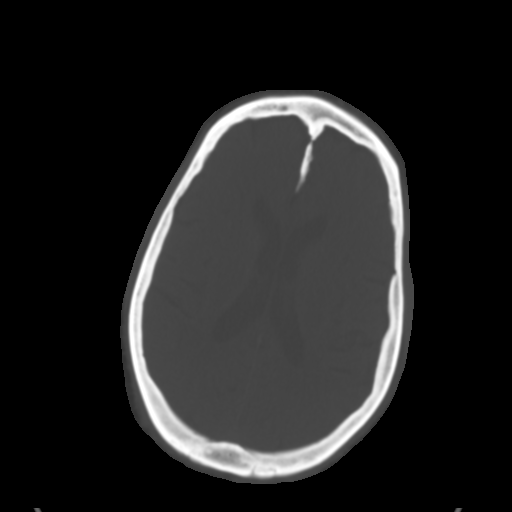
[im 27/36  brain]
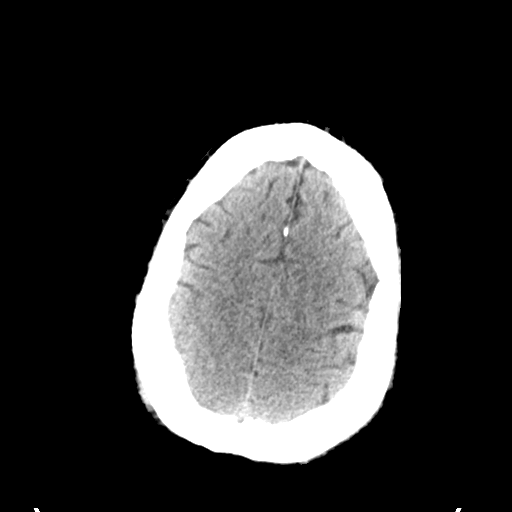
[im 31/36  brain]
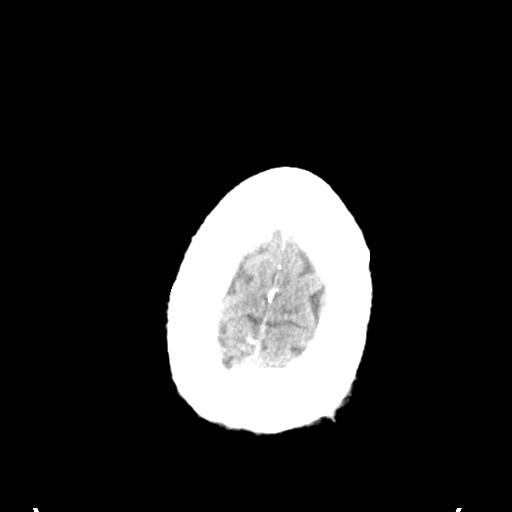

[Series 4: head bone · axial · 0.49mm/px · z∈[-124,-60]mm · 4 of 90 slices shown]
[im 9/90  bone]
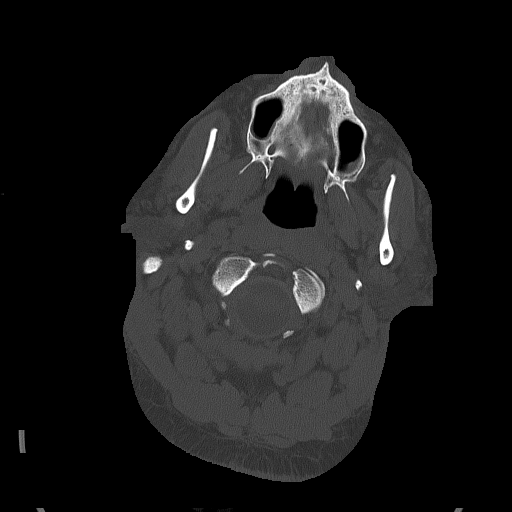
[im 18/90  bone]
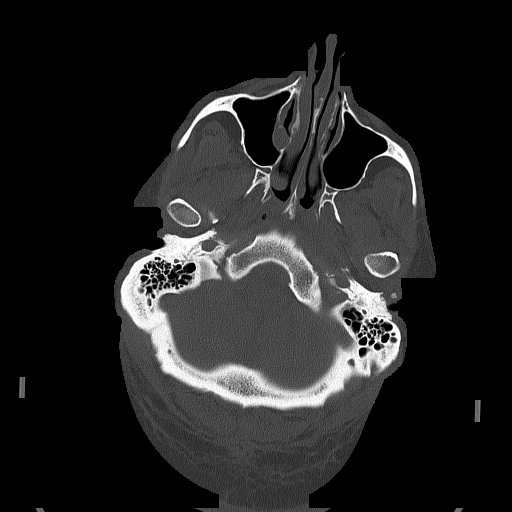
[im 27/90  bone]
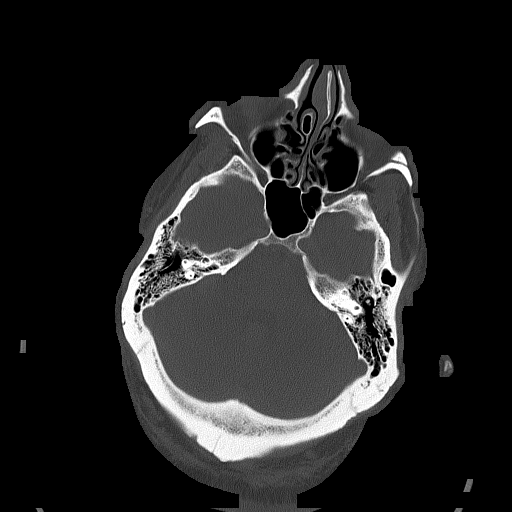
[im 41/90  bone]
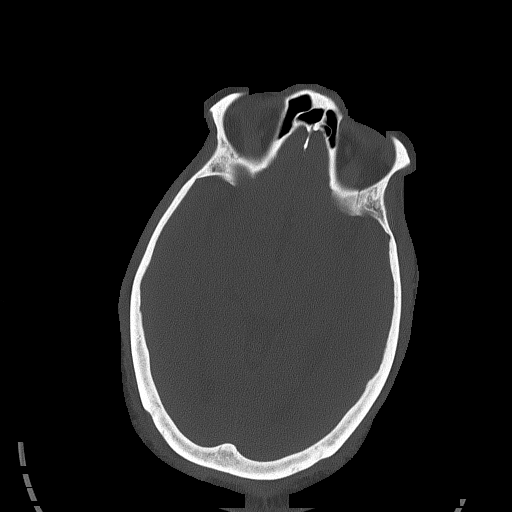

[Series 5: head without cor · coronal · non-contrast · 0.35mm/px · 3 of 75 slices shown]
[im 25/75  brain]
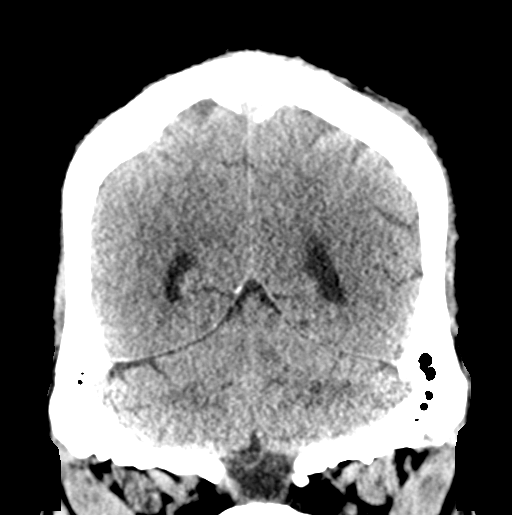
[im 33/75  brain]
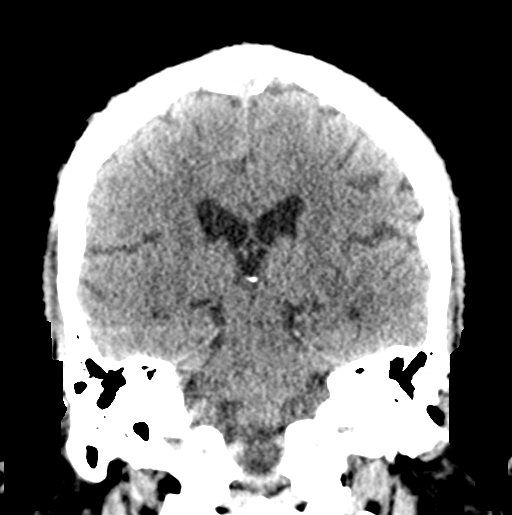
[im 42/75  brain]
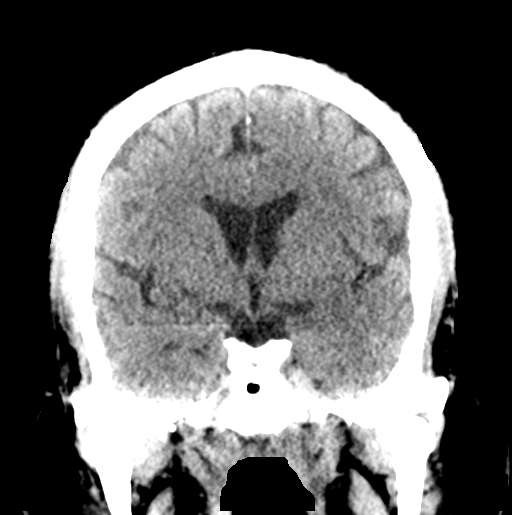

[Series 6: head without sag · sagittal · non-contrast · 0.36mm/px · 3 of 65 slices shown]
[im 22/65  brain]
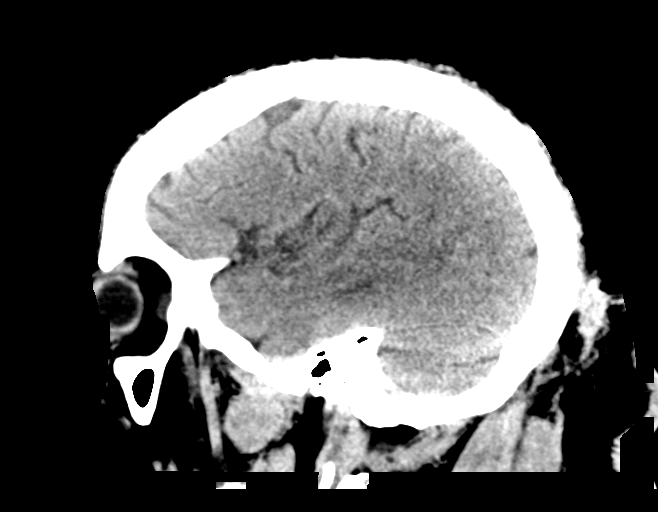
[im 33/65  brain]
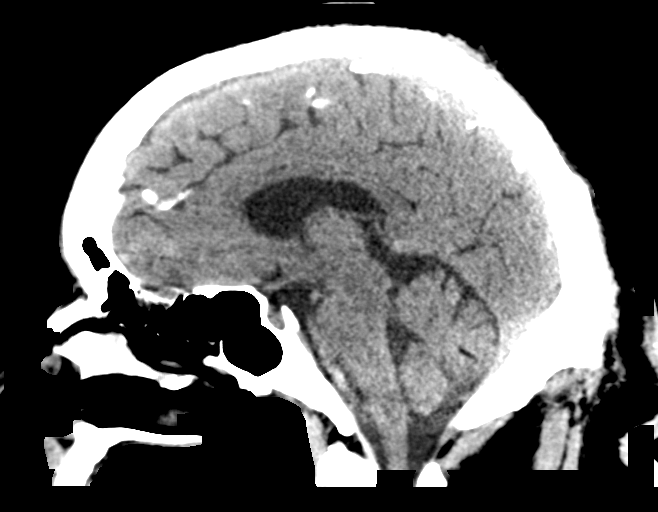
[im 43/65  brain]
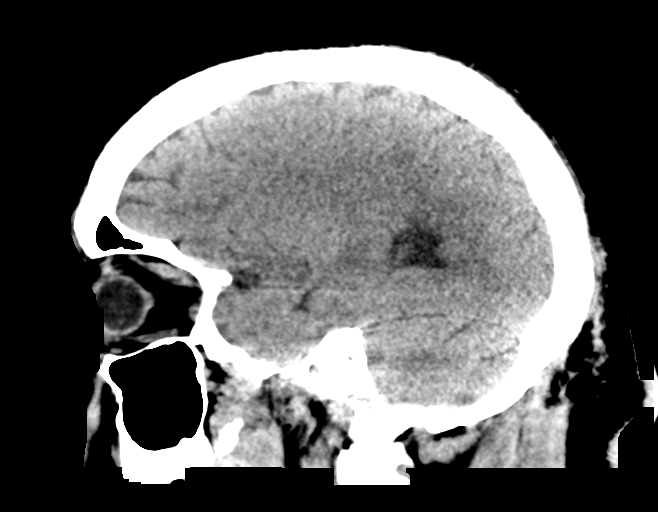

[17 of 47 positions shown; findings below may reference images not displayed]

FINDINGS: Brain: More well-defined area of decreased attenuation is identified
in the right half of the pons similar to that seen on prior MRI
examination consistent with evolving ischemia. No new area of
hemorrhage, infarct or space-occupying mass lesion is seen.

Vascular: No hyperdense vessel or unexpected calcification.

Skull: Normal. Negative for fracture or focal lesion.

Sinuses/Orbits: No acute finding.

Other: None.
IMPRESSION: Better defined area of decreased attenuation in the right half of
the pons consistent with evolving ischemia.

No new focal abnormality is noted.

## 2021-03-26 DIAGNOSIS — M6281 Muscle weakness (generalized): Secondary | ICD-10-CM | POA: Diagnosis not present

## 2021-03-26 DIAGNOSIS — R2689 Other abnormalities of gait and mobility: Secondary | ICD-10-CM | POA: Diagnosis not present

## 2021-03-26 DIAGNOSIS — I6930 Unspecified sequelae of cerebral infarction: Secondary | ICD-10-CM | POA: Diagnosis not present

## 2021-03-26 DIAGNOSIS — E118 Type 2 diabetes mellitus with unspecified complications: Secondary | ICD-10-CM | POA: Diagnosis not present

## 2021-03-26 DIAGNOSIS — R262 Difficulty in walking, not elsewhere classified: Secondary | ICD-10-CM | POA: Diagnosis not present

## 2021-03-26 DIAGNOSIS — I739 Peripheral vascular disease, unspecified: Secondary | ICD-10-CM | POA: Diagnosis not present

## 2021-03-27 DIAGNOSIS — E559 Vitamin D deficiency, unspecified: Secondary | ICD-10-CM | POA: Diagnosis not present

## 2021-03-27 DIAGNOSIS — I10 Essential (primary) hypertension: Secondary | ICD-10-CM | POA: Diagnosis not present

## 2021-03-27 DIAGNOSIS — D649 Anemia, unspecified: Secondary | ICD-10-CM | POA: Diagnosis not present

## 2021-03-27 DIAGNOSIS — E119 Type 2 diabetes mellitus without complications: Secondary | ICD-10-CM | POA: Diagnosis not present

## 2021-03-27 DIAGNOSIS — M6281 Muscle weakness (generalized): Secondary | ICD-10-CM | POA: Diagnosis not present

## 2021-03-27 DIAGNOSIS — E118 Type 2 diabetes mellitus with unspecified complications: Secondary | ICD-10-CM | POA: Diagnosis not present

## 2021-03-27 DIAGNOSIS — I739 Peripheral vascular disease, unspecified: Secondary | ICD-10-CM | POA: Diagnosis not present

## 2021-03-27 DIAGNOSIS — I6930 Unspecified sequelae of cerebral infarction: Secondary | ICD-10-CM | POA: Diagnosis not present

## 2021-03-27 DIAGNOSIS — R262 Difficulty in walking, not elsewhere classified: Secondary | ICD-10-CM | POA: Diagnosis not present

## 2021-03-27 DIAGNOSIS — R2689 Other abnormalities of gait and mobility: Secondary | ICD-10-CM | POA: Diagnosis not present

## 2021-03-28 DIAGNOSIS — T8131XA Disruption of external operation (surgical) wound, not elsewhere classified, initial encounter: Secondary | ICD-10-CM | POA: Diagnosis not present

## 2021-03-28 DIAGNOSIS — R2689 Other abnormalities of gait and mobility: Secondary | ICD-10-CM | POA: Diagnosis not present

## 2021-03-28 DIAGNOSIS — R262 Difficulty in walking, not elsewhere classified: Secondary | ICD-10-CM | POA: Diagnosis not present

## 2021-03-28 DIAGNOSIS — I6930 Unspecified sequelae of cerebral infarction: Secondary | ICD-10-CM | POA: Diagnosis not present

## 2021-03-28 DIAGNOSIS — I739 Peripheral vascular disease, unspecified: Secondary | ICD-10-CM | POA: Diagnosis not present

## 2021-03-28 DIAGNOSIS — M6281 Muscle weakness (generalized): Secondary | ICD-10-CM | POA: Diagnosis not present

## 2021-03-28 DIAGNOSIS — E118 Type 2 diabetes mellitus with unspecified complications: Secondary | ICD-10-CM | POA: Diagnosis not present

## 2021-03-29 DIAGNOSIS — M6281 Muscle weakness (generalized): Secondary | ICD-10-CM | POA: Diagnosis not present

## 2021-03-29 DIAGNOSIS — I2510 Atherosclerotic heart disease of native coronary artery without angina pectoris: Secondary | ICD-10-CM | POA: Diagnosis not present

## 2021-03-29 DIAGNOSIS — R2689 Other abnormalities of gait and mobility: Secondary | ICD-10-CM | POA: Diagnosis not present

## 2021-03-29 DIAGNOSIS — R262 Difficulty in walking, not elsewhere classified: Secondary | ICD-10-CM | POA: Diagnosis not present

## 2021-03-29 DIAGNOSIS — I739 Peripheral vascular disease, unspecified: Secondary | ICD-10-CM | POA: Diagnosis not present

## 2021-03-29 DIAGNOSIS — E669 Obesity, unspecified: Secondary | ICD-10-CM | POA: Diagnosis not present

## 2021-03-29 DIAGNOSIS — D689 Coagulation defect, unspecified: Secondary | ICD-10-CM | POA: Diagnosis not present

## 2021-03-29 DIAGNOSIS — I6930 Unspecified sequelae of cerebral infarction: Secondary | ICD-10-CM | POA: Diagnosis not present

## 2021-03-29 DIAGNOSIS — E118 Type 2 diabetes mellitus with unspecified complications: Secondary | ICD-10-CM | POA: Diagnosis not present

## 2021-03-30 DIAGNOSIS — I739 Peripheral vascular disease, unspecified: Secondary | ICD-10-CM | POA: Diagnosis not present

## 2021-03-30 DIAGNOSIS — M6281 Muscle weakness (generalized): Secondary | ICD-10-CM | POA: Diagnosis not present

## 2021-03-30 DIAGNOSIS — E118 Type 2 diabetes mellitus with unspecified complications: Secondary | ICD-10-CM | POA: Diagnosis not present

## 2021-03-30 DIAGNOSIS — I6930 Unspecified sequelae of cerebral infarction: Secondary | ICD-10-CM | POA: Diagnosis not present

## 2021-03-30 DIAGNOSIS — R2689 Other abnormalities of gait and mobility: Secondary | ICD-10-CM | POA: Diagnosis not present

## 2021-03-30 DIAGNOSIS — R262 Difficulty in walking, not elsewhere classified: Secondary | ICD-10-CM | POA: Diagnosis not present

## 2021-04-02 DIAGNOSIS — I739 Peripheral vascular disease, unspecified: Secondary | ICD-10-CM | POA: Diagnosis not present

## 2021-04-02 DIAGNOSIS — I6930 Unspecified sequelae of cerebral infarction: Secondary | ICD-10-CM | POA: Diagnosis not present

## 2021-04-02 DIAGNOSIS — E118 Type 2 diabetes mellitus with unspecified complications: Secondary | ICD-10-CM | POA: Diagnosis not present

## 2021-04-02 DIAGNOSIS — R262 Difficulty in walking, not elsewhere classified: Secondary | ICD-10-CM | POA: Diagnosis not present

## 2021-04-02 DIAGNOSIS — M6281 Muscle weakness (generalized): Secondary | ICD-10-CM | POA: Diagnosis not present

## 2021-04-02 DIAGNOSIS — R2689 Other abnormalities of gait and mobility: Secondary | ICD-10-CM | POA: Diagnosis not present

## 2021-04-03 DIAGNOSIS — R262 Difficulty in walking, not elsewhere classified: Secondary | ICD-10-CM | POA: Diagnosis not present

## 2021-04-03 DIAGNOSIS — M6281 Muscle weakness (generalized): Secondary | ICD-10-CM | POA: Diagnosis not present

## 2021-04-03 DIAGNOSIS — R2689 Other abnormalities of gait and mobility: Secondary | ICD-10-CM | POA: Diagnosis not present

## 2021-04-03 DIAGNOSIS — I739 Peripheral vascular disease, unspecified: Secondary | ICD-10-CM | POA: Diagnosis not present

## 2021-04-03 DIAGNOSIS — I6930 Unspecified sequelae of cerebral infarction: Secondary | ICD-10-CM | POA: Diagnosis not present

## 2021-04-03 DIAGNOSIS — E118 Type 2 diabetes mellitus with unspecified complications: Secondary | ICD-10-CM | POA: Diagnosis not present

## 2021-04-04 DIAGNOSIS — R2689 Other abnormalities of gait and mobility: Secondary | ICD-10-CM | POA: Diagnosis not present

## 2021-04-04 DIAGNOSIS — T8131XA Disruption of external operation (surgical) wound, not elsewhere classified, initial encounter: Secondary | ICD-10-CM | POA: Diagnosis not present

## 2021-04-04 DIAGNOSIS — E118 Type 2 diabetes mellitus with unspecified complications: Secondary | ICD-10-CM | POA: Diagnosis not present

## 2021-04-04 DIAGNOSIS — I6930 Unspecified sequelae of cerebral infarction: Secondary | ICD-10-CM | POA: Diagnosis not present

## 2021-04-04 DIAGNOSIS — M6281 Muscle weakness (generalized): Secondary | ICD-10-CM | POA: Diagnosis not present

## 2021-04-04 DIAGNOSIS — I739 Peripheral vascular disease, unspecified: Secondary | ICD-10-CM | POA: Diagnosis not present

## 2021-04-04 DIAGNOSIS — R262 Difficulty in walking, not elsewhere classified: Secondary | ICD-10-CM | POA: Diagnosis not present

## 2021-04-05 DIAGNOSIS — I739 Peripheral vascular disease, unspecified: Secondary | ICD-10-CM | POA: Diagnosis not present

## 2021-04-05 DIAGNOSIS — R262 Difficulty in walking, not elsewhere classified: Secondary | ICD-10-CM | POA: Diagnosis not present

## 2021-04-05 DIAGNOSIS — E118 Type 2 diabetes mellitus with unspecified complications: Secondary | ICD-10-CM | POA: Diagnosis not present

## 2021-04-05 DIAGNOSIS — R2689 Other abnormalities of gait and mobility: Secondary | ICD-10-CM | POA: Diagnosis not present

## 2021-04-05 DIAGNOSIS — M6281 Muscle weakness (generalized): Secondary | ICD-10-CM | POA: Diagnosis not present

## 2021-04-05 DIAGNOSIS — I6930 Unspecified sequelae of cerebral infarction: Secondary | ICD-10-CM | POA: Diagnosis not present

## 2021-04-06 DIAGNOSIS — R262 Difficulty in walking, not elsewhere classified: Secondary | ICD-10-CM | POA: Diagnosis not present

## 2021-04-06 DIAGNOSIS — I6930 Unspecified sequelae of cerebral infarction: Secondary | ICD-10-CM | POA: Diagnosis not present

## 2021-04-06 DIAGNOSIS — M6281 Muscle weakness (generalized): Secondary | ICD-10-CM | POA: Diagnosis not present

## 2021-04-06 DIAGNOSIS — I739 Peripheral vascular disease, unspecified: Secondary | ICD-10-CM | POA: Diagnosis not present

## 2021-04-06 DIAGNOSIS — E118 Type 2 diabetes mellitus with unspecified complications: Secondary | ICD-10-CM | POA: Diagnosis not present

## 2021-04-06 DIAGNOSIS — R2689 Other abnormalities of gait and mobility: Secondary | ICD-10-CM | POA: Diagnosis not present

## 2021-04-12 ENCOUNTER — Other Ambulatory Visit: Payer: Self-pay

## 2021-04-12 ENCOUNTER — Ambulatory Visit (INDEPENDENT_AMBULATORY_CARE_PROVIDER_SITE_OTHER): Payer: Commercial Managed Care - HMO | Admitting: Orthopedic Surgery

## 2021-04-12 ENCOUNTER — Encounter: Payer: Self-pay | Admitting: Orthopedic Surgery

## 2021-04-12 DIAGNOSIS — Z89612 Acquired absence of left leg above knee: Secondary | ICD-10-CM

## 2021-04-12 DIAGNOSIS — Z89511 Acquired absence of right leg below knee: Secondary | ICD-10-CM

## 2021-04-12 NOTE — Progress Notes (Signed)
Office Visit Note   Patient: Keith [name]           Date of Birth: [birth year]           MRN: [id number] Visit Date: 04/12/2021              Requested by: [name], NP [address] [place],  [place] [address] [name], NP  Chief Complaint  Patient presents with   Left Leg - Follow-up    01/03/21 left AKA      HPI: Patient is a 56 year old gentleman still at skilled nursing who is over 3 months out from a left above-the-knee amputation and also has an established right below the knee amputation.  Patient is scheduled for prosthetic casting January 9 with [facility].  Assessment & Plan: Visit Diagnoses:  1. History of left above knee amputation ([place])   2. Hx of right BKA ([place])     Plan: Patient will begin gait training once his prosthesis is available.  If he is out of skilled nursing we could proceed with therapy upstairs.  Follow-Up Instructions: Return in about 3 months (around 07/11/2021).   Ortho Exam  Patient is alert, oriented, no adenopathy, well-dressed, normal affect, normal respiratory effort. Examination the left residual limb is well-healed well consolidated there is no redness cellulitis or drainage.  There is 1 remaining staple that is removed.  Patient also has a loose fitting socket on the right and will need a new prosthetic socket on the right.  Imaging: No results found. No images are attached to the encounter.  Labs: Lab Results  Component Value Date   HGBA1C 5.9 (H) 11/29/2020   HGBA1C 13.9 (H) 08/29/2020   HGBA1C 10.6 (H) 05/19/2020   ESRSEDRATE 128 (H) 10/18/2020   ESRSEDRATE 85 (H) 10/07/2020   ESRSEDRATE 130 (H) 11/02/2018   CRP 11.2 (H) 10/18/2020   CRP 6.7 (H) 10/07/2020   CRP <0.8 11/02/2018   LABURIC 6.3 08/07/2009   REPTSTATUS 10/23/2020 FINAL 10/18/2020   GRAMSTAIN  04/29/2017    NO WBC SEEN RARE GRAM POSITIVE COCCI IN PAIRS IN SINGLES    CULT  10/18/2020    NO GROWTH 5 DAYS Performed at [facility], [place] [address] [place],  [address]    LABORGA METHICILLIN RESISTANT STAPHYLOCOCCUS AUREUS 04/29/2017     Lab Results  Component Value Date   ALBUMIN 2.5 (L) 12/19/2020   ALBUMIN 2.4 (L) 12/17/2020   ALBUMIN 2.3 (L) 12/16/2020   PREALBUMIN 25.5 06/06/2018   PREALBUMIN 7.4 (L) 03/15/2017    Lab Results  Component Value Date   MG 2.2 12/18/2020   MG 2.2 12/17/2020   MG 2.3 12/16/2020   Lab Results  Component Value Date   VD25OH 8.2 (L) 09/07/2018    Lab Results  Component Value Date   PREALBUMIN 25.5 06/06/2018   PREALBUMIN 7.4 (L) 03/15/2017   CBC EXTENDED Latest Ref Rng & Units 01/05/2021 01/05/2021 01/04/2021  WBC 4.0 - 10.5 K/uL - 11.4(H) 13.4(H)  RBC 4.22 - 5.81 MIL/uL - 2.63(L) 2.86(L)  HGB 13.0 - 17.0 g/dL 8.1(L) 7.5(L) 8.2(L)  HCT 39.0 - 52.0 % 26.0(L) 24.6(L) 25.9(L)  PLT 150 - 400 K/uL - 310 321  NEUTROABS 1.7 - 7.7 K/uL - - -  LYMPHSABS 0.7 - 4.0 K/uL - - -     There is no height or weight on file to calculate BMI.  Orders:  No orders of the defined types were placed  in this encounter.  No orders of the defined types were placed in this encounter.    Procedures: No procedures performed  Clinical Data: No additional findings.  ROS:  All other systems negative, except as noted in the HPI. Review of Systems  Objective: Vital Signs: There were no vitals taken for this visit.  Specialty Comments:  No specialty comments available.  PMFS History: Patient Active Problem List   Diagnosis Date Noted   Anxiety 02/13/2021   Healthcare maintenance 02/13/2021   History of left above knee amputation ([place]) 02/07/2021   Wound dehiscence 01/03/2021   Coagulopathy ([place]) 12/22/2020   CAD (coronary artery disease) 12/22/2020   Obesity 12/22/2020   Gross hematuria 12/20/2020   ABLA (acute blood loss anemia) 12/20/2020   Dehiscence of amputation stump (HCC)    Hx of BKA, left ([place])    Cellulitis of left foot    Acute osteomyelitis of left  calcaneus (HCC)    Ulcer of heel due to diabetes mellitus (HCC)    Normochromic normocytic anemia    Cellulitis 10/18/2020   Atypical chest pain 53/64/6803   Acute metabolic encephalopathy 21/22/4825   Hyperglycemia due to diabetes mellitus ([place]) 08/29/2020   Hyperglycemia 08/29/2020   Acute cerebrovascular accident (CVA) due to ischemia ([place]) 04/28/2020   Type 2 diabetes mellitus with hyperlipidemia (HCC)    PAD (peripheral artery disease) ([place])    Hypoglycemia 04/25/2020   Acute ST elevation myocardial infarction (STEMI) due to occlusion of circumflex coronary artery ([place]) 03/22/2020   Uncontrolled type 2 diabetes mellitus with hyperglycemia ([place]) 01/14/2019   Elevated glucose 01/14/2019   Hx of BKA, right ([place]) 01/14/2019   Pressure injury of skin 11/11/2018   STEMI (ST elevation myocardial infarction) ([place]) 11/10/2018   CKD (chronic kidney disease) stage 4, GFR 15-29 ml/min ([place]) 11/10/2018   Amputation stump infection ([place]) 10/28/2018   Type II diabetes mellitus with renal manifestations ([place]) 08/07/2018   Uncontrolled type 2 diabetes mellitus with hyperosmolar nonketotic hyperglycemia ([place]) 08/07/2018   Non-pressure chronic ulcer of right calf limited to breakdown of skin ([place]) 07/06/2018   Type 2 diabetes mellitus without complication, without long-term current use of insulin ([place]) 06/15/2018   Chronic low back pain without sciatica 06/15/2018   Idiopathic chronic venous hypertension of left lower extremity with ulcer and inflammation (HCC)    Venous stasis ulcer of left calf limited to breakdown of skin without varicose veins ([place]) 04/23/2018   Acquired absence of right leg below knee ([place]) 06/13/2017   Acute kidney injury superimposed on CKD ([place]) 03/15/2017   Diabetic polyneuropathy associated with type 2 diabetes mellitus ([place]) 01/23/2017   AKI (acute kidney injury) ([place]) 09/28/2016   Nausea vomiting and diarrhea 09/28/2016   Onychomycosis of multiple toenails with type 2  diabetes mellitus ([place]) 08/29/2015   MRSA carrier 04/20/2014   Penile wart 02/22/2014   HIV disease ([place])    Insulin-requiring or dependent type II diabetes mellitus ([place]) 02/04/2014   Dental anomaly 11/20/2012   Arthritis of right knee 02/23/2012   Hyperlipidemia 11/10/2011   Hyponatremia 11/10/2011   Chronic pain 08/07/2011   Meralgia paraesthetica 04/23/2011   ERECTILE DYSFUNCTION 08/22/2008   Elevated blood pressure reading with diagnosis of hypertension 05/19/2008   Past Medical History:  Diagnosis Date   Abscess of right foot    abscess/ulcer of R transtibial amputation requiring IV abx   AIDS ([place])    Anemia    CAD (coronary artery disease)    a. MI with stenting of OM1  in 11/2018 with residual disease. b. acute STEMI 03/2020 s/p DES to OM1   Chronic anemia    Chronic knee pain    right   Chronic pain    CKD (chronic kidney disease), stage IV (HCC)    CVA (cerebral vascular accident) ([place]) 04/2020   Diabetes type 2, uncontrolled    HgA1c 17.6 (04/27/2010)   Diabetic foot ulcer ([place]) 01/2017   right foot   Dilatation of aorta (HCC)    Erectile dysfunction    Genital warts    GERD (gastroesophageal reflux disease)    History of blood transfusion    HIV (human immunodeficiency virus infection) ([place]) 2009   CD4 count 100, VL 13800 (05/01/2010)   Hyperlipidemia    Hypertension    Myocardial infarction ([place])    Neuropathy    Noncompliance with medication regimen    Osteomyelitis ([place])    h/o hand   Osteomyelitis of metatarsal ([place]) 04/28/2017   Pneumonia     Family History  Problem Relation Age of Onset   Hypertension Mother    Arthritis Father    Hypertension Father    Hypertension Brother    Cancer Maternal Grandmother 39       unknown type of cancer   Depression Paternal Grandmother     Past Surgical History:  Procedure Laterality Date   AMPUTATION Right 05/02/2017   Procedure: AMPUTATION TRANSMETARSAL;  Surgeon: [name], MD;  Location: [facility];   Service: Orthopedics;  Laterality: Right;   AMPUTATION Right 06/13/2017   Procedure: RIGHT BELOW KNEE AMPUTATION;  Surgeon: [name], MD;  Location: [facility];  Service: Orthopedics;  Laterality: Right;   AMPUTATION Left 10/27/2020   Procedure: LEFT BELOW KNEE AMPUTATION;  Surgeon: [name], MD;  Location: [facility];  Service: Orthopedics;  Laterality: Left;   AMPUTATION Left 11/17/2020   Procedure: REVISION AMPUTATION BELOW KNEE, LEFT;  Surgeon: [name], MD;  Location: [facility];  Service: Orthopedics;  Laterality: Left;   AMPUTATION Left 01/03/2021   Procedure: LEFT ABOVE KNEE AMPUTATION;  Surgeon: [name], MD;  Location: [facility];  Service: Orthopedics;  Laterality: Left;   APPLICATION OF WOUND VAC Left 10/27/2020   Procedure: APPLICATION OF WOUND VAC;  Surgeon: [name], MD;  Location: [facility];  Service: Orthopedics;  Laterality: Left;   BELOW KNEE LEG AMPUTATION Right 06/13/2017   BELOW KNEE LEG AMPUTATION     CORONARY STENT INTERVENTION N/A 11/10/2018   Procedure: CORONARY STENT INTERVENTION;  Surgeon: Leonie Man, MD;  Location: [facility];  Service: Cardiovascular;  Laterality: N/A;   CORONARY/GRAFT ACUTE MI REVASCULARIZATION N/A 03/21/2020   Procedure: Coronary/Graft Acute MI Revascularization;  Surgeon: [name], MD;  Location: [facility];  Service: Cardiovascular;  Laterality: N/A;   CYSTOSCOPY N/A 11/09/2020   Procedure: CYSTOSCOPY, EVACUATION OF CLOTS, FULGERATION OF BLADDER AND BILATERIAL RETROGRADE PYELOGRAMS;  Surgeon: Janith Lima, MD;  Location: [facility];  Service: Urology;  Laterality: N/A;   HERNIA REPAIR     I & D EXTREMITY Left 08/21/2014   Procedure: INCISION AND DRAINAGE LEFT SMALL FINGER;  Surgeon: Leanora Cover, MD;  Location: [facility];  Service: Orthopedics;  Laterality: Left;   I & D EXTREMITY Right 03/18/2017   Procedure: IRRIGATION AND DEBRIDEMENT EXTREMITY;  Surgeon: [name], MD;  Location: [facility];  Service: Orthopedics;   Laterality: Right;   IR FLUORO GUIDE CV LINE RIGHT  11/02/2020   IR REMOVAL TUN  CV CATH W/O FL  11/13/2020   IR US GUIDE VASC ACCESS RIGHT  11/02/2020   LEFT HEART CATH AND CORONARY ANGIOGRAPHY N/A 11/10/2018   Procedure: LEFT HEART CATH AND CORONARY ANGIOGRAPHY;  Surgeon: Leonie Man, MD;  Location: [facility];  Service: Cardiovascular;  Laterality: N/A;   LEFT HEART CATH AND CORONARY ANGIOGRAPHY N/A 03/21/2020   Procedure: LEFT HEART CATH AND CORONARY ANGIOGRAPHY;  Surgeon: [name], MD;  Location: [facility];  Service: Cardiovascular;  Laterality: N/A;   MINOR IRRIGATION AND DEBRIDEMENT OF WOUND Right 04/22/2014   Procedure: IRRIGATION AND DEBRIDEMENT OF RIGHT NECK ABCESS;  Surgeon: Jerrell Belfast, MD;  Location: [facility];  Service: ENT;  Laterality: Right;   MULTIPLE EXTRACTIONS WITH ALVEOLOPLASTY N/A 01/18/2013   Procedure: MULTIPLE EXTRACION 3, 6, 7, 10, 11, 13, 21, 22, 27, 28, 29, 30 WITH ALVEOLOPLASTY;  Surgeon: Gae Bon, DDS;  Location: [facility];  Service: Oral Surgery;  Laterality: N/A;   SKIN SPLIT GRAFT Right 03/21/2017   Procedure: IRRIGATION AND DEBRIDEMENT RIGHT FOOT AND APPLY SPLIT THICKNESS SKIN GRAFT AND WOUND VAC;  Surgeon: [name], MD;  Location: [facility];  Service: Orthopedics;  Laterality: Right;   STUMP REVISION Left 12/02/2020   Procedure: REVISION LEFT BELOW KNEE AMPUTATION;  Surgeon: [name], MD;  Location: [facility];  Service: Orthopedics;  Laterality: Left;   TEE WITHOUT CARDIOVERSION N/A 05/02/2017   Procedure: TRANSESOPHAGEAL ECHOCARDIOGRAM (TEE);  Surgeon: Acie Fredrickson Wonda Cheng, MD;  Location: [facility];  Service: Cardiovascular;  Laterality: N/A;  coincidental to orthopedic case   Social History   Occupational History   Occupation: disabled  Tobacco Use   Smoking status: Never   Smokeless tobacco: Never  Vaping Use   Vaping Use: Never used  Substance and Sexual Activity   Alcohol use: Not Currently   Drug use: Never    Comment: OTC  ASA   Sexual activity: Yes    Partners: Female    Comment: declined condoms

## 2021-04-16 ENCOUNTER — Other Ambulatory Visit: Payer: Self-pay | Admitting: Family

## 2021-04-16 DIAGNOSIS — F419 Anxiety disorder, unspecified: Secondary | ICD-10-CM

## 2021-04-16 NOTE — Telephone Encounter (Signed)
Rcvd electronic refill request for Xanax. Please advise or approve refill.

## 2021-06-06 ENCOUNTER — Ambulatory Visit: Payer: Commercial Managed Care - HMO | Admitting: Family

## 2021-06-07 ENCOUNTER — Other Ambulatory Visit: Payer: Self-pay | Admitting: Infectious Diseases

## 2021-06-07 DIAGNOSIS — B20 Human immunodeficiency virus [HIV] disease: Secondary | ICD-10-CM

## 2021-06-11 ENCOUNTER — Ambulatory Visit (INDEPENDENT_AMBULATORY_CARE_PROVIDER_SITE_OTHER): Payer: Medicare Other | Admitting: Orthopedic Surgery

## 2021-06-11 ENCOUNTER — Encounter: Payer: Self-pay | Admitting: Orthopedic Surgery

## 2021-06-11 DIAGNOSIS — Z89612 Acquired absence of left leg above knee: Secondary | ICD-10-CM | POA: Diagnosis not present

## 2021-06-11 DIAGNOSIS — Z89511 Acquired absence of right leg below knee: Secondary | ICD-10-CM

## 2021-06-11 NOTE — Progress Notes (Signed)
Office Visit Note   Patient: Keith [name]           Date of Birth: 27-Feb-1966           MRN: [id number] Visit Date: 06/11/2021              Requested by: [name], NP [address] [place],  [place] [address] [name], NP  Chief Complaint  Patient presents with   Left Leg - Follow-up    01/03/21 left AKA      HPI: Patient is a 56 year old gentleman status post left above-the-knee amputation and right transtibial amputation.  Patiently now has prosthesis for both lower extremities.  Assessment & Plan: Visit Diagnoses:  1. Hx of right BKA ([place])   2. History of left above knee amputation ([place])     Plan: We will set patient up for gait training  Follow-Up Instructions: Return in about 4 weeks (around 07/09/2021).   Ortho Exam  Patient is alert, oriented, no adenopathy, well-dressed, normal affect, normal respiratory effort. Examination patient has a well-healed right transtibial amputation left above-knee amputation shows good healing there is 1 area 5 mm diameter granulation tissue there is no tenderness no cellulitis no drainage no tunneling no signs of infection.  Patient is currently in skilled nursing and will be leaving there soon.  Imaging: No results found. No images are attached to the encounter.  Labs: Lab Results  Component Value Date   HGBA1C 5.9 (H) 11/29/2020   HGBA1C 13.9 (H) 08/29/2020   HGBA1C 10.6 (H) 05/19/2020   ESRSEDRATE 128 (H) 10/18/2020   ESRSEDRATE 85 (H) 10/07/2020   ESRSEDRATE 130 (H) 11/02/2018   CRP 11.2 (H) 10/18/2020   CRP 6.7 (H) 10/07/2020   CRP <0.8 11/02/2018   LABURIC 6.3 08/07/2009   REPTSTATUS 10/23/2020 FINAL 10/18/2020   GRAMSTAIN  04/29/2017    NO WBC SEEN RARE GRAM POSITIVE COCCI IN PAIRS IN SINGLES    CULT  10/18/2020    NO GROWTH 5 DAYS Performed at [facility], [place] [address], [place], [place] [address]    LABORGA METHICILLIN RESISTANT STAPHYLOCOCCUS AUREUS 04/29/2017     Lab  Results  Component Value Date   ALBUMIN 2.5 (L) 12/19/2020   ALBUMIN 2.4 (L) 12/17/2020   ALBUMIN 2.3 (L) 12/16/2020   PREALBUMIN 25.5 06/06/2018   PREALBUMIN 7.4 (L) 03/15/2017    Lab Results  Component Value Date   MG 2.2 12/18/2020   MG 2.2 12/17/2020   MG 2.3 12/16/2020   Lab Results  Component Value Date   VD25OH 8.2 (L) 09/07/2018    Lab Results  Component Value Date   PREALBUMIN 25.5 06/06/2018   PREALBUMIN 7.4 (L) 03/15/2017   CBC EXTENDED Latest Ref Rng & Units 01/05/2021 01/05/2021 01/04/2021  WBC 4.0 - 10.5 K/uL - 11.4(H) 13.4(H)  RBC 4.22 - 5.81 MIL/uL - 2.63(L) 2.86(L)  HGB 13.0 - 17.0 g/dL 8.1(L) 7.5(L) 8.2(L)  HCT 39.0 - 52.0 % 26.0(L) 24.6(L) 25.9(L)  PLT 150 - 400 K/uL - 310 321  NEUTROABS 1.7 - 7.7 K/uL - - -  LYMPHSABS 0.7 - 4.0 K/uL - - -     There is no height or weight on file to calculate BMI.  Orders:  No orders of the defined types were placed in this encounter.  No orders of the defined types were placed in this encounter.    Procedures: No procedures performed  Clinical Data: No additional findings.  ROS:  All other systems negative, except as noted in the HPI. Review of Systems  Objective: Vital Signs: There were no vitals taken for this visit.  Specialty Comments:  No specialty comments available.  PMFS History: Patient Active Problem List   Diagnosis Date Noted   Anxiety 02/13/2021   Healthcare maintenance 02/13/2021   History of left above knee amputation ([place]) 02/07/2021   Wound dehiscence 01/03/2021   Coagulopathy ([place]) 12/22/2020   CAD (coronary artery disease) 12/22/2020   Obesity 12/22/2020   Gross hematuria 12/20/2020   ABLA (acute blood loss anemia) 12/20/2020   Dehiscence of amputation stump (HCC)    Hx of BKA, left ([place])    Cellulitis of left foot    Acute osteomyelitis of left calcaneus (HCC)    Ulcer of heel due to diabetes mellitus (HCC)    Normochromic normocytic anemia    Cellulitis 10/18/2020    Atypical chest pain 71/24/5809   Acute metabolic encephalopathy 98/33/8250   Hyperglycemia due to diabetes mellitus ([place]) 08/29/2020   Hyperglycemia 08/29/2020   Acute cerebrovascular accident (CVA) due to ischemia ([place]) 04/28/2020   Type 2 diabetes mellitus with hyperlipidemia (HCC)    PAD (peripheral artery disease) ([place])    Hypoglycemia 04/25/2020   Acute ST elevation myocardial infarction (STEMI) due to occlusion of circumflex coronary artery ([place]) 03/22/2020   Uncontrolled type 2 diabetes mellitus with hyperglycemia ([place]) 01/14/2019   Elevated glucose 01/14/2019   Hx of BKA, right ([place]) 01/14/2019   Pressure injury of skin 11/11/2018   STEMI (ST elevation myocardial infarction) ([place]) 11/10/2018   CKD (chronic kidney disease) stage 4, GFR 15-29 ml/min ([place]) 11/10/2018   Amputation stump infection ([place]) 10/28/2018   Type II diabetes mellitus with renal manifestations ([place]) 08/07/2018   Uncontrolled type 2 diabetes mellitus with hyperosmolar nonketotic hyperglycemia ([place]) 08/07/2018   Non-pressure chronic ulcer of right calf limited to breakdown of skin ([place]) 07/06/2018   Type 2 diabetes mellitus without complication, without long-term current use of insulin ([place]) 06/15/2018   Chronic low back pain without sciatica 06/15/2018   Idiopathic chronic venous hypertension of left lower extremity with ulcer and inflammation (HCC)    Venous stasis ulcer of left calf limited to breakdown of skin without varicose veins ([place]) 04/23/2018   Acquired absence of right leg below knee ([place]) 06/13/2017   Acute kidney injury superimposed on CKD ([place]) 03/15/2017   Diabetic polyneuropathy associated with type 2 diabetes mellitus ([place]) 01/23/2017   AKI (acute kidney injury) ([place]) 09/28/2016   Nausea vomiting and diarrhea 09/28/2016   Onychomycosis of multiple toenails with type 2 diabetes mellitus ([place]) 08/29/2015   MRSA carrier 04/20/2014   Penile wart 02/22/2014   HIV disease ([place])    Insulin-requiring  or dependent type II diabetes mellitus ([place]) 02/04/2014   Dental anomaly 11/20/2012   Arthritis of right knee 02/23/2012   Hyperlipidemia 11/10/2011   Hyponatremia 11/10/2011   Chronic pain 08/07/2011   Meralgia paraesthetica 04/23/2011   ERECTILE DYSFUNCTION 08/22/2008   Elevated blood pressure reading with diagnosis of hypertension 05/19/2008   Past Medical History:  Diagnosis Date   Abscess of right foot    abscess/ulcer of R transtibial amputation requiring IV abx   AIDS ([place])    Anemia    CAD (coronary artery disease)    a. MI with stenting of OM1 in 11/2018 with residual disease. b. acute STEMI 03/2020 s/p DES to OM1   Chronic anemia    Chronic knee pain    right   Chronic pain  CKD (chronic kidney disease), stage IV (HCC)    CVA (cerebral vascular accident) ([place]) 04/2020   Diabetes type 2, uncontrolled    HgA1c 17.6 (04/27/2010)   Diabetic foot ulcer ([place]) 01/2017   right foot   Dilatation of aorta (HCC)    Erectile dysfunction    Genital warts    GERD (gastroesophageal reflux disease)    History of blood transfusion    HIV (human immunodeficiency virus infection) ([place]) 2009   CD4 count 100, VL 13800 (05/01/2010)   Hyperlipidemia    Hypertension    Myocardial infarction ([place])    Neuropathy    Noncompliance with medication regimen    Osteomyelitis ([place])    h/o hand   Osteomyelitis of metatarsal ([place]) 04/28/2017   Pneumonia     Family History  Problem Relation Age of Onset   Hypertension Mother    Arthritis Father    Hypertension Father    Hypertension Brother    Cancer Maternal Grandmother 85       unknown type of cancer   Depression Paternal Grandmother     Past Surgical History:  Procedure Laterality Date   AMPUTATION Right 05/02/2017   Procedure: AMPUTATION TRANSMETARSAL;  Surgeon: [name], MD;  Location: [facility];  Service: Orthopedics;  Laterality: Right;   AMPUTATION Right 06/13/2017   Procedure: RIGHT BELOW KNEE AMPUTATION;  Surgeon: [name], MD;  Location: [facility];  Service: Orthopedics;  Laterality: Right;   AMPUTATION Left 10/27/2020   Procedure: LEFT BELOW KNEE AMPUTATION;  Surgeon: [name], MD;  Location: [facility];  Service: Orthopedics;  Laterality: Left;   AMPUTATION Left 11/17/2020   Procedure: REVISION AMPUTATION BELOW KNEE, LEFT;  Surgeon: [name], MD;  Location: [facility];  Service: Orthopedics;  Laterality: Left;   AMPUTATION Left 01/03/2021   Procedure: LEFT ABOVE KNEE AMPUTATION;  Surgeon: [name], MD;  Location: [facility];  Service: Orthopedics;  Laterality: Left;   APPLICATION OF WOUND VAC Left 10/27/2020   Procedure: APPLICATION OF WOUND VAC;  Surgeon: [name], MD;  Location: [facility];  Service: Orthopedics;  Laterality: Left;   BELOW KNEE LEG AMPUTATION Right 06/13/2017   BELOW KNEE LEG AMPUTATION     CORONARY STENT INTERVENTION N/A 11/10/2018   Procedure: CORONARY STENT INTERVENTION;  Surgeon: Leonie Man, MD;  Location: [facility];  Service: Cardiovascular;  Laterality: N/A;   CORONARY/GRAFT ACUTE MI REVASCULARIZATION N/A 03/21/2020   Procedure: Coronary/Graft Acute MI Revascularization;  Surgeon: [name], MD;  Location: [facility];  Service: Cardiovascular;  Laterality: N/A;   CYSTOSCOPY N/A 11/09/2020   Procedure: CYSTOSCOPY, EVACUATION OF CLOTS, FULGERATION OF BLADDER AND BILATERIAL RETROGRADE PYELOGRAMS;  Surgeon: Janith Lima, MD;  Location: [facility];  Service: Urology;  Laterality: N/A;   HERNIA REPAIR     I & D EXTREMITY Left 08/21/2014   Procedure: INCISION AND DRAINAGE LEFT SMALL FINGER;  Surgeon: Leanora Cover, MD;  Location: [facility];  Service: Orthopedics;  Laterality: Left;   I & D EXTREMITY Right 03/18/2017   Procedure: IRRIGATION AND DEBRIDEMENT EXTREMITY;  Surgeon: [name], MD;  Location: [facility];  Service: Orthopedics;  Laterality: Right;   IR FLUORO GUIDE CV LINE RIGHT  11/02/2020   IR REMOVAL TUN CV CATH W/O FL  11/13/2020   IR US GUIDE VASC ACCESS  RIGHT  11/02/2020   LEFT HEART CATH AND CORONARY ANGIOGRAPHY N/A 11/10/2018   Procedure: LEFT HEART CATH AND CORONARY  ANGIOGRAPHY;  Surgeon: Leonie Man, MD;  Location: [facility];  Service: Cardiovascular;  Laterality: N/A;   LEFT HEART CATH AND CORONARY ANGIOGRAPHY N/A 03/21/2020   Procedure: LEFT HEART CATH AND CORONARY ANGIOGRAPHY;  Surgeon: [name], MD;  Location: [facility];  Service: Cardiovascular;  Laterality: N/A;   MINOR IRRIGATION AND DEBRIDEMENT OF WOUND Right 04/22/2014   Procedure: IRRIGATION AND DEBRIDEMENT OF RIGHT NECK ABCESS;  Surgeon: Jerrell Belfast, MD;  Location: [facility];  Service: ENT;  Laterality: Right;   MULTIPLE EXTRACTIONS WITH ALVEOLOPLASTY N/A 01/18/2013   Procedure: MULTIPLE EXTRACION 3, 6, 7, 10, 11, 13, 21, 22, 27, 28, 29, 30 WITH ALVEOLOPLASTY;  Surgeon: Gae Bon, DDS;  Location: [facility];  Service: Oral Surgery;  Laterality: N/A;   SKIN SPLIT GRAFT Right 03/21/2017   Procedure: IRRIGATION AND DEBRIDEMENT RIGHT FOOT AND APPLY SPLIT THICKNESS SKIN GRAFT AND WOUND VAC;  Surgeon: [name], MD;  Location: [facility];  Service: Orthopedics;  Laterality: Right;   STUMP REVISION Left 12/02/2020   Procedure: REVISION LEFT BELOW KNEE AMPUTATION;  Surgeon: [name], MD;  Location: [facility];  Service: Orthopedics;  Laterality: Left;   TEE WITHOUT CARDIOVERSION N/A 05/02/2017   Procedure: TRANSESOPHAGEAL ECHOCARDIOGRAM (TEE);  Surgeon: Acie Fredrickson Wonda Cheng, MD;  Location: [facility];  Service: Cardiovascular;  Laterality: N/A;  coincidental to orthopedic case   Social History   Occupational History   Occupation: disabled  Tobacco Use   Smoking status: Never   Smokeless tobacco: Never  Vaping Use   Vaping Use: Never used  Substance and Sexual Activity   Alcohol use: Not Currently   Drug use: Never    Comment: OTC ASA   Sexual activity: Yes    Partners: Female    Comment: declined condoms

## 2021-06-27 ENCOUNTER — Ambulatory Visit: Payer: Medicare Other | Attending: Orthopedic Surgery

## 2021-07-09 ENCOUNTER — Ambulatory Visit: Payer: Medicaid Other | Admitting: Orthopedic Surgery

## 2021-07-10 ENCOUNTER — Ambulatory Visit: Payer: Medicare Other

## 2021-07-17 ENCOUNTER — Ambulatory Visit: Payer: Medicaid Other | Admitting: Orthopedic Surgery

## 2021-08-15 ENCOUNTER — Ambulatory Visit: Payer: Medicare Other | Attending: Orthopedic Surgery

## 2021-08-15 DIAGNOSIS — Z89511 Acquired absence of right leg below knee: Secondary | ICD-10-CM | POA: Diagnosis present

## 2021-08-15 DIAGNOSIS — R2689 Other abnormalities of gait and mobility: Secondary | ICD-10-CM | POA: Insufficient documentation

## 2021-08-15 DIAGNOSIS — M6281 Muscle weakness (generalized): Secondary | ICD-10-CM | POA: Insufficient documentation

## 2021-08-15 DIAGNOSIS — Z89612 Acquired absence of left leg above knee: Secondary | ICD-10-CM | POA: Diagnosis present

## 2021-08-15 NOTE — Therapy (Signed)
? ?OUTPATIENT PHYSICAL THERAPY PROSTHETICS EVALUATION ? ? ?Patient Name: Keith [name] ?MRN: [id number] ?DOB:Sep 19, 1965, 56 y.o., male ?Today's Date: 08/15/2021 ? ?PCP: Dionisio David, NP ?REFERRING PROVIDER: [name] ? ? PT End of Session - 08/15/21 1630   ? ? Visit Number 1   ? Number of Visits 17   ? Date for PT Re-Evaluation 10/10/21   ? Authorization Type UHC Medicare/Medicaid: follow Medicare guidelines   ? Authorization Time Period Eval 08/15/21   ? Progress Note Due on Visit 10   ? PT Start Time 6010   ? PT Stop Time 9323   ? PT Time Calculation (min) 60 min   ? Equipment Utilized During Treatment Gait belt   ? Activity Tolerance Patient tolerated treatment well   ? Behavior During Therapy [facility] for tasks assessed/performed   ? ?  ?  ? ?  ? ? ?Past Medical History:  ?Diagnosis Date  ? Abscess of right foot   ? abscess/ulcer of R transtibial amputation requiring IV abx  ? AIDS ([place])   ? Anemia   ? CAD (coronary artery disease)   ? a. MI with stenting of OM1 in 11/2018 with residual disease. b. acute STEMI 03/2020 s/p DES to OM1  ? Chronic anemia   ? Chronic knee pain   ? right  ? Chronic pain   ? CKD (chronic kidney disease), stage IV ([place])   ? CVA (cerebral vascular accident) ([place]) 04/2020  ? Diabetes type 2, uncontrolled   ? HgA1c 17.6 (04/27/2010)  ? Diabetic foot ulcer ([place]) 01/2017  ? right foot  ? Dilatation of aorta (HCC)   ? Erectile dysfunction   ? Genital warts   ? GERD (gastroesophageal reflux disease)   ? History of blood transfusion   ? HIV (human immunodeficiency virus infection) ([place]) 2009  ? CD4 count 100, VL 13800 (05/01/2010)  ? Hyperlipidemia   ? Hypertension   ? Myocardial infarction [facility])   ? Neuropathy   ? Noncompliance with medication regimen   ? Osteomyelitis ([place])   ? h/o hand  ? Osteomyelitis of metatarsal ([place]) 04/28/2017  ? Pneumonia   ? ?Past Surgical History:  ?Procedure Laterality Date  ? AMPUTATION Right 05/02/2017  ? Procedure: AMPUTATION TRANSMETARSAL;  Surgeon: [name], MD;   Location: [place];  Service: Orthopedics;  Laterality: Right;  ? AMPUTATION Right 06/13/2017  ? Procedure: RIGHT BELOW KNEE AMPUTATION;  Surgeon: [name], MD;  Location: [place];  Service: Orthopedics;  Laterality: Right;  ? AMPUTATION Left 10/27/2020  ? Procedure: LEFT BELOW KNEE AMPUTATION;  Surgeon: [name], MD;  Location: [place];  Service: Orthopedics;  Laterality: Left;  ? AMPUTATION Left 11/17/2020  ? Procedure: REVISION AMPUTATION BELOW KNEE, LEFT;  Surgeon: [name], MD;  Location: [place];  Service: Orthopedics;  Laterality: Left;  ? AMPUTATION Left 01/03/2021  ? Procedure: LEFT ABOVE KNEE AMPUTATION;  Surgeon: [name], MD;  Location: [place];  Service: Orthopedics;  Laterality: Left;  ? APPLICATION OF WOUND VAC Left 10/27/2020  ? Procedure: APPLICATION OF WOUND VAC;  Surgeon: [name], MD;  Location: [place];  Service: Orthopedics;  Laterality: Left;  ? BELOW KNEE LEG AMPUTATION Right 06/13/2017  ? BELOW KNEE LEG AMPUTATION    ? CORONARY STENT INTERVENTION N/A 11/10/2018  ? Procedure: CORONARY STENT INTERVENTION;  Surgeon: Leonie Man, MD;  Location: [place] [facility];  Service: Cardiovascular;  Laterality: N/A;  ? CORONARY/GRAFT ACUTE MI REVASCULARIZATION N/A 03/21/2020  ?  Procedure: Coronary/Graft Acute MI Revascularization;  Surgeon: [name], MD;  Location: [place] [facility];  Service: Cardiovascular;  Laterality: N/A;  ? CYSTOSCOPY N/A 11/09/2020  ? Procedure: CYSTOSCOPY, EVACUATION OF CLOTS, FULGERATION OF BLADDER AND BILATERIAL RETROGRADE PYELOGRAMS;  Surgeon: Janith Lima, MD;  Location: [place];  Service: Urology;  Laterality: N/A;  ? HERNIA REPAIR    ? I & D EXTREMITY Left 08/21/2014  ? Procedure: INCISION AND DRAINAGE LEFT SMALL FINGER;  Surgeon: Leanora Cover, MD;  Location: [place];  Service: Orthopedics;  Laterality: Left;  ? I & D EXTREMITY Right 03/18/2017  ? Procedure: IRRIGATION AND DEBRIDEMENT EXTREMITY;  Surgeon: [name], MD;  Location: [place];  Service:  Orthopedics;  Laterality: Right;  ? IR FLUORO GUIDE CV LINE RIGHT  11/02/2020  ? IR REMOVAL TUN CV CATH W/O FL  11/13/2020  ? IR US GUIDE VASC ACCESS RIGHT  11/02/2020  ? LEFT HEART CATH AND CORONARY ANGIOGRAPHY N/A 11/10/2018  ? Procedure: LEFT HEART CATH AND CORONARY ANGIOGRAPHY;  Surgeon: Leonie Man, MD;  Location: [place] [facility];  Service: Cardiovascular;  Laterality: N/A;  ? LEFT HEART CATH AND CORONARY ANGIOGRAPHY N/A 03/21/2020  ? Procedure: LEFT HEART CATH AND CORONARY ANGIOGRAPHY;  Surgeon: [name], MD;  Location: [place] [facility];  Service: Cardiovascular;  Laterality: N/A;  ? MINOR IRRIGATION AND DEBRIDEMENT OF WOUND Right 04/22/2014  ? Procedure: IRRIGATION AND DEBRIDEMENT OF RIGHT NECK ABCESS;  Surgeon: Jerrell Belfast, MD;  Location: [place];  Service: ENT;  Laterality: Right;  ? MULTIPLE EXTRACTIONS WITH ALVEOLOPLASTY N/A 01/18/2013  ? Procedure: MULTIPLE EXTRACION 3, 6, 7, 10, 11, 13, 21, 22, 27, 28, 29, 30 WITH ALVEOLOPLASTY;  Surgeon: Gae Bon, DDS;  Location: [place];  Service: Oral Surgery;  Laterality: N/A;  ? SKIN SPLIT GRAFT Right 03/21/2017  ? Procedure: IRRIGATION AND DEBRIDEMENT RIGHT FOOT AND APPLY SPLIT THICKNESS SKIN GRAFT AND WOUND VAC;  Surgeon: [name], MD;  Location: [place];  Service: Orthopedics;  Laterality: Right;  ? STUMP REVISION Left 12/02/2020  ? Procedure: REVISION LEFT BELOW KNEE AMPUTATION;  Surgeon: [name], MD;  Location: [place];  Service: Orthopedics;  Laterality: Left;  ? TEE WITHOUT CARDIOVERSION N/A 05/02/2017  ? Procedure: TRANSESOPHAGEAL ECHOCARDIOGRAM (TEE);  Surgeon: Acie Fredrickson Wonda Cheng, MD;  Location[facility];  Service: Cardiovascular;  Laterality: N/A;  coincidental to orthopedic case  ? ?Patient Active Problem List  ? Diagnosis Date Noted  ? Anxiety 02/13/2021  ? Healthcare maintenance 02/13/2021  ? History of left above knee amputation ([place]) 02/07/2021  ? Wound dehiscence 01/03/2021  ? Coagulopathy ([place]) 12/22/2020  ? CAD (coronary artery  disease) 12/22/2020  ? Obesity 12/22/2020  ? Gross hematuria 12/20/2020  ? ABLA (acute blood loss anemia) 12/20/2020  ? Dehiscence of amputation stump (HCC)   ? Hx of BKA, left ([place])   ? Cellulitis of left foot   ? Acute osteomyelitis of left calcaneus (HCC)   ? Ulcer of heel due to diabetes mellitus ([name])   ? Normochromic normocytic anemia   ? Cellulitis 10/18/2020  ? Atypical chest pain 09/29/2020  ? Acute metabolic encephalopathy 97/35/3299  ? Hyperglycemia due to diabetes mellitus ([place]) 08/29/2020  ? Hyperglycemia 08/29/2020  ? Acute cerebrovascular accident (CVA) due to ischemia ([place]) 04/28/2020  ? Type 2 diabetes mellitus with hyperlipidemia ([place])   ? PAD (peripheral artery disease) ([place])   ? Hypoglycemia 04/25/2020  ? Acute ST elevation myocardial infarction (STEMI)  due to occlusion of circumflex coronary artery ([place]) 03/22/2020  ? Uncontrolled type 2 diabetes mellitus with hyperglycemia ([place]) 01/14/2019  ? Elevated glucose 01/14/2019  ? Hx of BKA, right ([place]) 01/14/2019  ? Pressure injury of skin 11/11/2018  ? STEMI (ST elevation myocardial infarction) ([place]) 11/10/2018  ? CKD (chronic kidney disease) stage 4, GFR 15-29 ml/min (HCC) 11/10/2018  ? Amputation stump infection ([place]) 10/28/2018  ? Type II diabetes mellitus with renal manifestations ([place]) 08/07/2018  ? Uncontrolled type 2 diabetes mellitus with hyperosmolar nonketotic hyperglycemia ([place]) 08/07/2018  ? Non-pressure chronic ulcer of right calf limited to breakdown of skin ([place]) 07/06/2018  ? Type 2 diabetes mellitus without complication, without long-term current use of insulin ([place]) 06/15/2018  ? Chronic low back pain without sciatica 06/15/2018  ? Idiopathic chronic venous hypertension of left lower extremity with ulcer and inflammation (HCC)   ? Venous stasis ulcer of left calf limited to breakdown of skin without varicose veins ([place]) 04/23/2018  ? Acquired absence of right leg below knee ([place]) 06/13/2017  ? Acute kidney injury superimposed on CKD  ([place]) 03/15/2017  ? Diabetic polyneuropathy associated with type 2 diabetes mellitus ([place]) 01/23/2017  ? AKI (acute kidney injury) ([place]) 09/28/2016  ? Nausea vomiting and diarrhea 09/28/2016  ? [place]

## 2021-08-16 ENCOUNTER — Ambulatory Visit: Payer: Medicare HMO | Admitting: Infectious Diseases

## 2021-08-17 ENCOUNTER — Other Ambulatory Visit: Payer: Self-pay

## 2021-08-17 ENCOUNTER — Encounter: Payer: Self-pay | Admitting: Infectious Diseases

## 2021-08-17 ENCOUNTER — Ambulatory Visit (INDEPENDENT_AMBULATORY_CARE_PROVIDER_SITE_OTHER): Payer: Medicare Other | Admitting: Infectious Diseases

## 2021-08-17 VITALS — BP 159/86 | HR 66 | Temp 97.3°F

## 2021-08-17 DIAGNOSIS — I2121 ST elevation (STEMI) myocardial infarction involving left circumflex coronary artery: Secondary | ICD-10-CM

## 2021-08-17 DIAGNOSIS — E1122 Type 2 diabetes mellitus with diabetic chronic kidney disease: Secondary | ICD-10-CM | POA: Diagnosis not present

## 2021-08-17 DIAGNOSIS — B20 Human immunodeficiency virus [HIV] disease: Secondary | ICD-10-CM | POA: Diagnosis not present

## 2021-08-17 DIAGNOSIS — R197 Diarrhea, unspecified: Secondary | ICD-10-CM

## 2021-08-17 DIAGNOSIS — Z79899 Other long term (current) drug therapy: Secondary | ICD-10-CM | POA: Diagnosis not present

## 2021-08-17 DIAGNOSIS — Z113 Encounter for screening for infections with a predominantly sexual mode of transmission: Secondary | ICD-10-CM | POA: Diagnosis not present

## 2021-08-17 DIAGNOSIS — R112 Nausea with vomiting, unspecified: Secondary | ICD-10-CM

## 2021-08-17 DIAGNOSIS — E119 Type 2 diabetes mellitus without complications: Secondary | ICD-10-CM

## 2021-08-17 DIAGNOSIS — T8781 Dehiscence of amputation stump: Secondary | ICD-10-CM

## 2021-08-17 DIAGNOSIS — Z794 Long term (current) use of insulin: Secondary | ICD-10-CM

## 2021-08-17 DIAGNOSIS — N183 Chronic kidney disease, stage 3 unspecified: Secondary | ICD-10-CM

## 2021-08-17 MED ORDER — PANTOPRAZOLE SODIUM 40 MG PO TBEC: 40.0000 mg | DELAYED_RELEASE_TABLET | Freq: Every day | ORAL | 0 refills | Status: DC

## 2021-08-17 NOTE — Assessment & Plan Note (Signed)
Has had f/u with Dr Sharol Given.  ?States this has healed.  ?

## 2021-08-17 NOTE — Progress Notes (Signed)
? ?Subjective:  ? ? [name], male  DOB: [birth year], 56 y.o.        MRN: [id number] ? ? ?HPI ?56 y.o. M HIV+, uncontrolled DM2, HTN, stage III CKD, and chronic right foot osteomyelitis who presented to the ED 04/28/17 with worsening distal plantar pain and fever/chills concerning for worsening wound infection/osteomyelitis despite outpatient augmentin. He had right trans-met amputation 05/02/2017. Blood cultures grew MRSA PICC line was placed 1/29 and he completed vancomycin to 2/7.  ?  ?On 06-13-17 he underwent trans-tibial amputation after developing an abscess and wound dehisc in his R foot. ? He was in hospital 08-07-2018 with HHS and AKI (CBG 395 and Anion Gap 11).  ?He is currently on prezcobix and triumeq (he had genotype 05-2018 showing background mutations, geno 11-2009 showing no mutations, 2010 M184V).  ?  ?He was switched to biktarvy 2021.  ?  ?Since he has been in hospital with acute MI (11-2018), infection of R BKA stump (10-2018).  ?He was again hospitalized 03-21-20 with a STEMI and had L circ stent. His A1C was > 15.5%.  ?He returned to hospital 04-25-20 to 05-03-20 with L sided weakness. He was found on MRI to have R pons infarct.  ?He was continued on his brilanta, ASA, lipitor. His diabetic regimen was adjusted and his BP was felt to be well controlled on carvedilol.  ?He was d/c to SNF/[facility]hab. [facility] and now d/c to home ?He lives alone which has been harder than expected.  ?He is going to PT to get his strenght back up.  ? ?Denies issues with his ART.  ? ? ?HIV 1 RNA Quant  ?Date Value  ?02/13/2021 Not Detected Copies/mL  ?10/20/2020 130 copies/mL  ?05/19/2020 1,780 copies/mL (H)  ? ?CD4 T Cell Abs (/uL)  ?Date Value  ?02/13/2021 166 (L)  ?12/12/2020 103 (L)  ?09/01/2019 89 (L)  ? ? ? ?Health Maintenance  ?Topic Date Due  ?? Zoster Vaccines- Shingrix (1 of 2) Never done  ?? COLONOSCOPY (Pts 45-6yr Insurance coverage will need to be confirmed)  Never done  ?? OPHTHALMOLOGY EXAM   06/23/2012  ?? FOOT EXAM  02/23/2015  ?? URINE MICROALBUMIN  02/23/2015  ?? COVID-19 Vaccine (2 - Janssen risk series) 12/10/2019  ?? HEMOGLOBIN A1C  03/01/2021  ?? LIPID PANEL  04/25/2021  ?? INFLUENZA VACCINE  11/06/2021  ?? TETANUS/TDAP  05/30/2024  ?? Hepatitis C Screening  Completed  ?? HIV Screening  Completed  ?? HPV VACCINES  Aged Out  ? ? ? ? ?Review of Systems  ?Constitutional:  Negative for weight loss.  ?Respiratory:  Negative for cough and shortness of breath.   ?Cardiovascular:  Negative for chest pain.  ?Gastrointestinal:  Negative for blood in stool, constipation and diarrhea.  ?Genitourinary:  Negative for dysuria and hematuria.  ?Neurological:  Negative for sensory change, focal weakness and headaches.  ? ?Please see HPI. All other systems reviewed and negative. ? ?   ?Objective:  ?Physical Exam ?Vitals reviewed.  ?Constitutional:   ?   Appearance: He is not ill-appearing.  ?HENT:  ?   Mouth/Throat:  ?   Mouth: Mucous membranes are moist.  ?Eyes:  ?   Extraocular Movements: Extraocular movements intact.  ?Cardiovascular:  ?   Rate and Rhythm: Normal rate.  ?Pulmonary:  ?   Effort: Pulmonary effort is normal.  ?   Breath sounds: Normal breath sounds.  ?Abdominal:  ?   General: Bowel sounds are normal.  ?  Palpations: Abdomen is soft.  ?Musculoskeletal:  ?   Cervical back: Normal range of motion. No rigidity.  ?   Comments: B AKA, prosthesis in place.   ?Neurological:  ?   Mental Status: He is alert.  ? ? ? ? ? ?   ?Assessment & Plan:  ? ?

## 2021-08-17 NOTE — Assessment & Plan Note (Signed)
He appears to be doing well.  ?He is going to get a home health aide.  ?Some concern about his drowsiness but he does awake and is back to baseline.  ?Will f/u his labs.  ?Will 6 months.  ?

## 2021-08-17 NOTE — Assessment & Plan Note (Signed)
Appreciate his PCP f/u ?Will check his A1C today. ?

## 2021-08-17 NOTE — Assessment & Plan Note (Signed)
No further CP ?He is on his asa, statin, beta blocker.  ?

## 2021-08-20 ENCOUNTER — Ambulatory Visit: Payer: Medicare Other | Admitting: Physical Therapy

## 2021-08-20 NOTE — Therapy (Deleted)
OUTPATIENT PHYSICAL THERAPY PROSTHETICS TREATMENT   Patient Name: OSINACHI NAVARRETTE MRN: [id number] DOB:[birth year], 56 y.o., male Today's Date: 08/20/2021  PCP: Dionisio David, NP REFERRING PROVIDER: [name]     Past Medical History:  Diagnosis Date   Abscess of right foot    abscess/ulcer of R transtibial amputation requiring IV abx   AIDS ([place])    Anemia    CAD (coronary artery disease)    a. MI with stenting of OM1 in 11/2018 with residual disease. b. acute STEMI 03/2020 s/p DES to OM1   Chronic anemia    Chronic knee pain    right   Chronic pain    CKD (chronic kidney disease), stage IV (HCC)    CVA (cerebral vascular accident) ([place]) 04/2020   Diabetes type 2, uncontrolled    HgA1c 17.6 (04/27/2010)   Diabetic foot ulcer ([place]) 01/2017   right foot   Dilatation of aorta (HCC)    Erectile dysfunction    Genital warts    GERD (gastroesophageal reflux disease)    History of blood transfusion    HIV (human immunodeficiency virus infection) ([place]) 2009   CD4 count 100, VL 13800 (05/01/2010)   Hyperlipidemia    Hypertension    Myocardial infarction ([place])    Neuropathy    Noncompliance with medication regimen    Osteomyelitis ([place])    h/o hand   Osteomyelitis of metatarsal ([place]) 04/28/2017   Pneumonia    Past Surgical History:  Procedure Laterality Date   AMPUTATION Right 05/02/2017   Procedure: AMPUTATION TRANSMETARSAL;  Surgeon: [name], MD;  Location: [facility];  Service: Orthopedics;  Laterality: Right;   AMPUTATION Right 06/13/2017   Procedure: RIGHT BELOW KNEE AMPUTATION;  Surgeon: [name], MD;  Location: [facility];  Service: Orthopedics;  Laterality: Right;   AMPUTATION Left 10/27/2020   Procedure: LEFT BELOW KNEE AMPUTATION;  Surgeon: [name], MD;  Location: [facility];  Service: Orthopedics;  Laterality: Left;   AMPUTATION Left 11/17/2020   Procedure: REVISION AMPUTATION BELOW KNEE, LEFT;  Surgeon: [name], MD;  Location: [facility];  Service:  Orthopedics;  Laterality: Left;   AMPUTATION Left 01/03/2021   Procedure: LEFT ABOVE KNEE AMPUTATION;  Surgeon: [name], MD;  Location: [facility];  Service: Orthopedics;  Laterality: Left;   APPLICATION OF WOUND VAC Left 10/27/2020   Procedure: APPLICATION OF WOUND VAC;  Surgeon: [name], MD;  Location: [facility];  Service: Orthopedics;  Laterality: Left;   BELOW KNEE LEG AMPUTATION Right 06/13/2017   BELOW KNEE LEG AMPUTATION     CORONARY STENT INTERVENTION N/A 11/10/2018   Procedure: CORONARY STENT INTERVENTION;  Surgeon: Leonie Man, MD;  Location: [facility];  Service: Cardiovascular;  Laterality: N/A;   CORONARY/GRAFT ACUTE MI REVASCULARIZATION N/A 03/21/2020   Procedure: Coronary/Graft Acute MI Revascularization;  Surgeon: [name], MD;  Location: [facility];  Service: Cardiovascular;  Laterality: N/A;   CYSTOSCOPY N/A 11/09/2020   Procedure: CYSTOSCOPY, EVACUATION OF CLOTS, FULGERATION OF BLADDER AND BILATERIAL RETROGRADE PYELOGRAMS;  Surgeon: Janith Lima, MD;  Location: [facility];  Service: Urology;  Laterality: N/A;   HERNIA REPAIR     I & D EXTREMITY Left 08/21/2014   Procedure: INCISION AND DRAINAGE LEFT SMALL FINGER;  Surgeon: Leanora Cover, MD;  Location: [facility];  Service: Orthopedics;  Laterality: Left;   I & D EXTREMITY Right 03/18/2017   Procedure: IRRIGATION AND DEBRIDEMENT EXTREMITY;  Surgeon: Meridee Score  V, MD;  Location: [facility];  Service: Orthopedics;  Laterality: Right;   IR FLUORO GUIDE CV LINE RIGHT  11/02/2020   IR REMOVAL TUN CV CATH W/O FL  11/13/2020   IR US GUIDE VASC ACCESS RIGHT  11/02/2020   LEFT HEART CATH AND CORONARY ANGIOGRAPHY N/A 11/10/2018   Procedure: LEFT HEART CATH AND CORONARY ANGIOGRAPHY;  Surgeon: Leonie Man, MD;  Location: [facility];  Service: Cardiovascular;  Laterality: N/A;   LEFT HEART CATH AND CORONARY ANGIOGRAPHY N/A 03/21/2020   Procedure: LEFT HEART CATH AND CORONARY ANGIOGRAPHY;  Surgeon: [name], MD;  Location: [facility];  Service: Cardiovascular;  Laterality: N/A;   MINOR IRRIGATION AND DEBRIDEMENT OF WOUND Right 04/22/2014   Procedure: IRRIGATION AND DEBRIDEMENT OF RIGHT NECK ABCESS;  Surgeon: Jerrell Belfast, MD;  Location: [facility];  Service: ENT;  Laterality: Right;   MULTIPLE EXTRACTIONS WITH ALVEOLOPLASTY N/A 01/18/2013   Procedure: MULTIPLE EXTRACION 3, 6, 7, 10, 11, 13, 21, 22, 27, 28, 29, 30 WITH ALVEOLOPLASTY;  Surgeon: Gae Bon, DDS;  Location: [facility];  Service: Oral Surgery;  Laterality: N/A;   SKIN SPLIT GRAFT Right 03/21/2017   Procedure: IRRIGATION AND DEBRIDEMENT RIGHT FOOT AND APPLY SPLIT THICKNESS SKIN GRAFT AND WOUND VAC;  Surgeon: [name], MD;  Location: [facility];  Service: Orthopedics;  Laterality: Right;   STUMP REVISION Left 12/02/2020   Procedure: REVISION LEFT BELOW KNEE AMPUTATION;  Surgeon: [name], MD;  Location: [facility];  Service: Orthopedics;  Laterality: Left;   TEE WITHOUT CARDIOVERSION N/A 05/02/2017   Procedure: TRANSESOPHAGEAL ECHOCARDIOGRAM (TEE);  Surgeon: Thayer Headings, MD;  Location: [facility];  Service: Cardiovascular;  Laterality: N/A;  coincidental to orthopedic case   Patient Active Problem List   Diagnosis Date Noted   Anxiety 02/13/2021   Healthcare maintenance 02/13/2021   History of left above knee amputation ([place]) 02/07/2021   Wound dehiscence 01/03/2021   Coagulopathy ([place]) 12/22/2020   CAD (coronary artery disease) 12/22/2020   Obesity 12/22/2020   Gross hematuria 12/20/2020   ABLA (acute blood loss anemia) 12/20/2020   Dehiscence of amputation stump (HCC)    Hx of BKA, left (HCC)    Cellulitis of left foot    Acute osteomyelitis of left calcaneus (HCC)    Ulcer of heel due to diabetes mellitus (HCC)    Normochromic normocytic anemia    Cellulitis 10/18/2020   Atypical chest pain 16/01/9603   Acute metabolic encephalopathy 54/12/8117   Hyperglycemia due to diabetes mellitus ([place]) 08/29/2020   Hyperglycemia  08/29/2020   Acute cerebrovascular accident (CVA) due to ischemia ([place]) 04/28/2020   Type 2 diabetes mellitus with hyperlipidemia (HCC)    PAD (peripheral artery disease) ([place])    Hypoglycemia 04/25/2020   Acute ST elevation myocardial infarction (STEMI) due to occlusion of circumflex coronary artery ([place]) 03/22/2020   Uncontrolled type 2 diabetes mellitus with hyperglycemia ([place]) 01/14/2019   Elevated glucose 01/14/2019   Hx of BKA, right ([place]) 01/14/2019   Pressure injury of skin 11/11/2018   STEMI (ST elevation myocardial infarction) ([place]) 11/10/2018   CKD (chronic kidney disease) stage 4, GFR 15-29 ml/min ([place]) 11/10/2018   Amputation stump infection ([place]) 10/28/2018   Type II diabetes mellitus with renal manifestations ([place]) 08/07/2018   Uncontrolled type 2 diabetes mellitus with hyperosmolar nonketotic hyperglycemia ([place]) 08/07/2018   Non-pressure chronic ulcer of right calf limited to breakdown of skin ([place]) 07/06/2018   Type 2 diabetes mellitus without  complication, without long-term current use of insulin ([place]) 06/15/2018   Chronic low back pain without sciatica 06/15/2018   Idiopathic chronic venous hypertension of left lower extremity with ulcer and inflammation (HCC)    Venous stasis ulcer of left calf limited to breakdown of skin without varicose veins ([place]) 04/23/2018   Acquired absence of right leg below knee ([place]) 06/13/2017   Acute kidney injury superimposed on CKD ([place]) 03/15/2017   Diabetic polyneuropathy associated with type 2 diabetes mellitus ([place]) 01/23/2017   AKI (acute kidney injury) ([place]) 09/28/2016   Nausea vomiting and diarrhea 09/28/2016   Onychomycosis of multiple toenails with type 2 diabetes mellitus ([employer]) 08/29/2015   MRSA carrier 04/20/2014   Penile wart 02/22/2014   HIV disease ([place])    Insulin-requiring or dependent type II diabetes mellitus ([place]) 02/04/2014   Dental anomaly 11/20/2012   Arthritis of right knee 02/23/2012   Hyperlipidemia 11/10/2011    Hyponatremia 11/10/2011   Chronic pain 08/07/2011   Meralgia paraesthetica 04/23/2011   ERECTILE DYSFUNCTION 08/22/2008   Elevated blood pressure reading with diagnosis of hypertension 05/19/2008    ONSET DATE: 05/09/21- approximate date of receipt of prosthesis as pt didn't remember exact date  REFERRING DIAG: Z89.511 (ICD-10-CM) - Hx of right BKA ([place]) Z89.612 (ICD-10-CM) - History of left above knee amputation ([place])   THERAPY DIAG:  Other abnormalities of gait and mobility   Muscle weakness (generalized)   Hx of right BKA ([place])   Hx of AKA (above knee amputation), left ([place])  SUBJECTIVE:   SUBJECTIVE STATEMENT:     PERTINENT HISTORY: HIV, DM, CAD, Stage IV CKD, hx of CVA 2 years ago   PAIN:  Are you having pain? No   PRECAUTIONS: Fall   OBJECTIVE:   TODAY'S TREATMENT:   TREATMENT:    PATIENT EDUCATION: Education details: see above Person educated: Patient Education method: Explanation Education comprehension: verbalized understanding   HOME EXERCISE PROGRAM: TBD  ASSESSMENT:  CLINICAL IMPRESSION:     OBJECTIVE IMPAIRMENTS Abnormal gait, decreased activity tolerance, decreased balance, decreased endurance, decreased mobility, difficulty walking, decreased ROM, decreased strength, decreased safety awareness, hypomobility, increased edema, increased fascial restrictions, increased muscle spasms, impaired flexibility, impaired sensation, improper body mechanics, postural dysfunction, prosthetic dependency , and pain.   ACTIVITY LIMITATIONS cleaning, community activity, driving, meal prep, and laundry.   PERSONAL FACTORS Past/current experiences and Time since onset of injury/illness/exacerbation are also affecting patient's functional outcome.    REHAB POTENTIAL: Good  CLINICAL DECISION MAKING: Stable/uncomplicated  EVALUATION COMPLEXITY: Moderate   GOALS: Goals reviewed with patient? Yes  SHORT TERM GOALS: Target date: 09/12/2021    Patient  will demo I with correct donning/doffing of L prosthetic leg without verbal cues and show independent adjustment of the prosthetic leg with prolonged WB by adjusting pull strap and modifying ply socks. Baseline: Education initiated (08/15/21) Goal status: INITIAL  2.  Patient will be able to ambulate 52' with RW and CGA to improve in home mobility with rollator. Baseline: 55feet (08/15/21) Goal status: INITIAL  3.  Patient will be able to maintain standing balance with one UE while performing dynamic reaching to improve functional balance. Baseline: not attempted (08/15/21) Goal status: INITIAL  4.  Patient will be able to perform chair to chair transfer with SBA from standard chair to standard chair with Rolator and SBA Baseline: Rolator and requires mod A (08/15/21) Goal status: INITIAL   LONG TERM GOALS: Target date: 10/10/2021    Patient will demo TUG score <60 sec with appropriate  AD and SBA to improve functional mobility and decrease fall risk Baseline: 87 sec with Rolator and Min A (08/15/21) Goal status: INITIAL  2.  Patient will demo at least 0.32m/s improvement in his functional gait speed to improve community ambulation Baseline: TBD(08/15/21) Goal status: INITIAL  3.  Patient will demo at least 8 points improvement in his BBS to improve his functional standing balance. Baseline: 7/56 high risk of fall (08/15/21) Goal status: INITIAL  4.  Pt will be I with prosthetic donning/doffing, residual limb care, ply sock adjustment to improve independence with prosthetic management. Baseline: Education initiated (08/15/21) Goal status: INITIAL  5.  Pt will demo 12 hours of total wear tolerance with L prosthetic leg to improve functional use of prosthetic leg with functional activities. Baseline: 8 hours (08/15/21) Goal status: INITIAL     PLAN: PT FREQUENCY: 2x/week  PT DURATION: 8 weeks  PLANNED INTERVENTIONS: Therapeutic exercises, Therapeutic activity, Neuromuscular  re-education, Balance training, Gait training, Patient/Family education, Joint mobilization, Stair training, Prosthetic training, Wheelchair mobility training, Cryotherapy, Moist heat, Manual lymph drainage, Compression bandaging, and Manual therapy  PLAN FOR NEXT SESSION: Assess 10 meter gait speed, education for proper donning/doffing, initiate sit to stand at counter with weight shifts and strap adjustment with prolonged WB   Bjorn Loser, PTA 08/20/2021, 9:26 AM

## 2021-08-21 ENCOUNTER — Inpatient Hospital Stay (HOSPITAL_COMMUNITY)
Admission: EM | Admit: 2021-08-21 | Discharge: 2021-08-24 | DRG: 683 | Disposition: A | Discharge status: Home / Self Care | Payer: Medicare Other | Attending: Internal Medicine | Admitting: Internal Medicine

## 2021-08-21 ENCOUNTER — Other Ambulatory Visit (HOSPITAL_COMMUNITY): Payer: Self-pay

## 2021-08-21 DIAGNOSIS — E1165 Type 2 diabetes mellitus with hyperglycemia: Secondary | ICD-10-CM | POA: Diagnosis present

## 2021-08-21 DIAGNOSIS — Z89512 Acquired absence of left leg below knee: Secondary | ICD-10-CM

## 2021-08-21 DIAGNOSIS — R519 Headache, unspecified: Secondary | ICD-10-CM | POA: Diagnosis present

## 2021-08-21 DIAGNOSIS — F32A Depression, unspecified: Secondary | ICD-10-CM | POA: Diagnosis present

## 2021-08-21 DIAGNOSIS — I252 Old myocardial infarction: Secondary | ICD-10-CM

## 2021-08-21 DIAGNOSIS — N400 Enlarged prostate without lower urinary tract symptoms: Secondary | ICD-10-CM | POA: Diagnosis present

## 2021-08-21 DIAGNOSIS — M109 Gout, unspecified: Secondary | ICD-10-CM | POA: Diagnosis present

## 2021-08-21 DIAGNOSIS — Z8249 Family history of ischemic heart disease and other diseases of the circulatory system: Secondary | ICD-10-CM

## 2021-08-21 DIAGNOSIS — G894 Chronic pain syndrome: Secondary | ICD-10-CM | POA: Diagnosis present

## 2021-08-21 DIAGNOSIS — N3000 Acute cystitis without hematuria: Secondary | ICD-10-CM | POA: Diagnosis present

## 2021-08-21 DIAGNOSIS — E1140 Type 2 diabetes mellitus with diabetic neuropathy, unspecified: Secondary | ICD-10-CM | POA: Diagnosis present

## 2021-08-21 DIAGNOSIS — Z7982 Long term (current) use of aspirin: Secondary | ICD-10-CM

## 2021-08-21 DIAGNOSIS — E872 Acidosis, unspecified: Secondary | ICD-10-CM | POA: Diagnosis present

## 2021-08-21 DIAGNOSIS — I129 Hypertensive chronic kidney disease with stage 1 through stage 4 chronic kidney disease, or unspecified chronic kidney disease: Secondary | ICD-10-CM | POA: Diagnosis present

## 2021-08-21 DIAGNOSIS — Z6839 Body mass index (BMI) 39.0-39.9, adult: Secondary | ICD-10-CM

## 2021-08-21 DIAGNOSIS — Z79899 Other long term (current) drug therapy: Secondary | ICD-10-CM

## 2021-08-21 DIAGNOSIS — N184 Chronic kidney disease, stage 4 (severe): Secondary | ICD-10-CM | POA: Diagnosis present

## 2021-08-21 DIAGNOSIS — E785 Hyperlipidemia, unspecified: Secondary | ICD-10-CM | POA: Diagnosis present

## 2021-08-21 DIAGNOSIS — N3001 Acute cystitis with hematuria: Secondary | ICD-10-CM

## 2021-08-21 DIAGNOSIS — Z8673 Personal history of transient ischemic attack (TIA), and cerebral infarction without residual deficits: Secondary | ICD-10-CM

## 2021-08-21 DIAGNOSIS — N179 Acute kidney failure, unspecified: Principal | ICD-10-CM | POA: Diagnosis present

## 2021-08-21 DIAGNOSIS — Z882 Allergy status to sulfonamides status: Secondary | ICD-10-CM

## 2021-08-21 DIAGNOSIS — B20 Human immunodeficiency virus [HIV] disease: Secondary | ICD-10-CM | POA: Diagnosis present

## 2021-08-21 DIAGNOSIS — I2510 Atherosclerotic heart disease of native coronary artery without angina pectoris: Secondary | ICD-10-CM | POA: Diagnosis present

## 2021-08-21 DIAGNOSIS — Z79891 Long term (current) use of opiate analgesic: Secondary | ICD-10-CM

## 2021-08-21 DIAGNOSIS — Z89511 Acquired absence of right leg below knee: Secondary | ICD-10-CM

## 2021-08-21 DIAGNOSIS — K219 Gastro-esophageal reflux disease without esophagitis: Secondary | ICD-10-CM | POA: Diagnosis present

## 2021-08-21 DIAGNOSIS — F419 Anxiety disorder, unspecified: Secondary | ICD-10-CM | POA: Diagnosis present

## 2021-08-21 DIAGNOSIS — I10 Essential (primary) hypertension: Secondary | ICD-10-CM

## 2021-08-21 DIAGNOSIS — D631 Anemia in chronic kidney disease: Secondary | ICD-10-CM | POA: Diagnosis present

## 2021-08-21 DIAGNOSIS — Z955 Presence of coronary angioplasty implant and graft: Secondary | ICD-10-CM

## 2021-08-21 DIAGNOSIS — E669 Obesity, unspecified: Secondary | ICD-10-CM | POA: Diagnosis present

## 2021-08-21 LAB — CBC WITH DIFFERENTIAL/PLATELET
Absolute Monocytes: 549 cells/uL (ref 200–950)
Basophils Absolute: 49 cells/uL (ref 0–200)
Basophils Relative: 0.8 %
Eosinophils Absolute: 232 cells/uL (ref 15–500)
Eosinophils Relative: 3.8 %
HCT: 32.7 % — ABNORMAL LOW (ref 38.5–50.0)
Hemoglobin: 10.6 g/dL — ABNORMAL LOW (ref 13.2–17.1)
Lymphs Abs: 1476 cells/uL (ref 850–3900)
MCH: 30.3 pg (ref 27.0–33.0)
MCHC: 32.4 g/dL (ref 32.0–36.0)
MCV: 93.4 fL (ref 80.0–100.0)
MPV: 10.3 fL (ref 7.5–12.5)
Monocytes Relative: 9 %
Neutro Abs: 3794 cells/uL (ref 1500–7800)
Neutrophils Relative %: 62.2 %
Platelets: 216 10*3/uL (ref 140–400)
RBC: 3.5 10*6/uL — ABNORMAL LOW (ref 4.20–5.80)
RDW: 13.6 % (ref 11.0–15.0)
Total Lymphocyte: 24.2 %
WBC: 6.1 10*3/uL (ref 3.8–10.8)

## 2021-08-21 LAB — LIPID PANEL
Cholesterol: 175 mg/dL (ref <200)
HDL: 45 mg/dL (ref >=40)
LDL Cholesterol (Calc): 105 mg/dL (calc) — ABNORMAL HIGH
Non-HDL Cholesterol (Calc): 130 mg/dL (calc) — ABNORMAL HIGH (ref <130)
Total CHOL/HDL Ratio: 3.9 (calc) (ref <5.0)
Triglycerides: 134 mg/dL (ref <150)

## 2021-08-21 LAB — HEMOGLOBIN A1C
Hgb A1c MFr Bld: 6 % of total Hgb — ABNORMAL HIGH (ref <5.7)
Mean Plasma Glucose: 126 mg/dL
eAG (mmol/L): 7 mmol/L

## 2021-08-21 LAB — COMPLETE METABOLIC PANEL WITH GFR
AG Ratio: 1.1 (calc) (ref 1.0–2.5)
ALT: 12 U/L (ref 9–46)
AST: 16 U/L (ref 10–35)
Albumin: 3.6 g/dL (ref 3.6–5.1)
Alkaline phosphatase (APISO): 88 U/L (ref 35–144)
BUN/Creatinine Ratio: 11 (calc) (ref 6–22)
BUN: 38 mg/dL — ABNORMAL HIGH (ref 7–25)
CO2: 23 mmol/L (ref 20–32)
Calcium: 8.8 mg/dL (ref 8.6–10.3)
Chloride: 108 mmol/L (ref 98–110)
Creat: 3.51 mg/dL — ABNORMAL HIGH (ref 0.70–1.30)
Globulin: 3.4 g/dL (calc) (ref 1.9–3.7)
Glucose, Bld: 149 mg/dL — ABNORMAL HIGH (ref 65–99)
Potassium: 5.2 mmol/L (ref 3.5–5.3)
Sodium: 137 mmol/L (ref 135–146)
Total Bilirubin: 0.5 mg/dL (ref 0.2–1.2)
Total Protein: 7 g/dL (ref 6.1–8.1)
eGFR: 20 mL/min/{1.73_m2} — ABNORMAL LOW (ref >=60)

## 2021-08-21 LAB — T-HELPER CELLS (CD4) COUNT (NOT AT ARMC)
Absolute CD4: 129 cells/uL — ABNORMAL LOW (ref 490–1740)
CD4 T Helper %: 9 % — ABNORMAL LOW (ref 30–61)
Total lymphocyte count: 1381 cells/uL (ref 850–3900)

## 2021-08-21 LAB — RPR: RPR Ser Ql: NONREACTIVE

## 2021-08-21 LAB — HIV-1 RNA QUANT-NO REFLEX-BLD
HIV 1 RNA Quant: <20 copies/mL — AB
HIV-1 RNA Quant, Log: <1.3 Log copies/mL — AB

## 2021-08-22 ENCOUNTER — Other Ambulatory Visit: Payer: Self-pay

## 2021-08-22 ENCOUNTER — Encounter (HOSPITAL_COMMUNITY): Payer: Self-pay | Admitting: Emergency Medicine

## 2021-08-22 ENCOUNTER — Observation Stay (HOSPITAL_COMMUNITY): Payer: Medicare Other

## 2021-08-22 ENCOUNTER — Emergency Department (HOSPITAL_COMMUNITY): Payer: Medicare Other

## 2021-08-22 DIAGNOSIS — N179 Acute kidney failure, unspecified: Secondary | ICD-10-CM | POA: Diagnosis not present

## 2021-08-22 DIAGNOSIS — R519 Headache, unspecified: Secondary | ICD-10-CM

## 2021-08-22 LAB — URINALYSIS, ROUTINE W REFLEX MICROSCOPIC
Bilirubin Urine: NEGATIVE
Glucose, UA: 50 mg/dL — AB
Ketones, ur: NEGATIVE mg/dL
Nitrite: NEGATIVE
Protein, ur: 100 mg/dL — AB
Specific Gravity, Urine: 1.017 (ref 1.005–1.030)
WBC, UA: >50 WBC/hpf — ABNORMAL HIGH (ref 0–5)
pH: 5 (ref 5.0–8.0)

## 2021-08-22 LAB — CBC WITH DIFFERENTIAL/PLATELET
Abs Immature Granulocytes: 0.03 10*3/uL (ref 0.00–0.07)
Basophils Absolute: 0 10*3/uL (ref 0.0–0.1)
Basophils Relative: 0 %
Eosinophils Absolute: 0.2 10*3/uL (ref 0.0–0.5)
Eosinophils Relative: 2 %
HCT: 36.7 % — ABNORMAL LOW (ref 39.0–52.0)
Hemoglobin: 11.6 g/dL — ABNORMAL LOW (ref 13.0–17.0)
Immature Granulocytes: 0 %
Lymphocytes Relative: 20 %
Lymphs Abs: 1.4 10*3/uL (ref 0.7–4.0)
MCH: 30.9 pg (ref 26.0–34.0)
MCHC: 31.6 g/dL (ref 30.0–36.0)
MCV: 97.6 fL (ref 80.0–100.0)
Monocytes Absolute: 0.5 10*3/uL (ref 0.1–1.0)
Monocytes Relative: 8 %
Neutro Abs: 4.9 10*3/uL (ref 1.7–7.7)
Neutrophils Relative %: 70 %
Platelets: 264 10*3/uL (ref 150–400)
RBC: 3.76 MIL/uL — ABNORMAL LOW (ref 4.22–5.81)
RDW: 14.1 % (ref 11.5–15.5)
WBC: 7 10*3/uL (ref 4.0–10.5)
nRBC: 0 % (ref 0.0–0.2)

## 2021-08-22 LAB — COMPREHENSIVE METABOLIC PANEL
ALT: 19 U/L (ref 0–44)
AST: 21 U/L (ref 15–41)
Albumin: 3.4 g/dL — ABNORMAL LOW (ref 3.5–5.0)
Alkaline Phosphatase: 79 U/L (ref 38–126)
Anion gap: 6 (ref 5–15)
BUN: 43 mg/dL — ABNORMAL HIGH (ref 6–20)
CO2: 20 mmol/L — ABNORMAL LOW (ref 22–32)
Calcium: 9.1 mg/dL (ref 8.9–10.3)
Chloride: 113 mmol/L — ABNORMAL HIGH (ref 98–111)
Creatinine, Ser: 3.6 mg/dL — ABNORMAL HIGH (ref 0.61–1.24)
GFR, Estimated: 19 mL/min — ABNORMAL LOW (ref >60)
Glucose, Bld: 142 mg/dL — ABNORMAL HIGH (ref 70–99)
Potassium: 4.4 mmol/L (ref 3.5–5.1)
Sodium: 139 mmol/L (ref 135–145)
Total Bilirubin: 0.5 mg/dL (ref 0.3–1.2)
Total Protein: 7.7 g/dL (ref 6.5–8.1)

## 2021-08-22 LAB — SODIUM, URINE, RANDOM: Sodium, Ur: 41 mmol/L

## 2021-08-22 LAB — CBG MONITORING, ED: Glucose-Capillary: 119 mg/dL — ABNORMAL HIGH (ref 70–99)

## 2021-08-22 LAB — GLUCOSE, CAPILLARY
Glucose-Capillary: 286 mg/dL — ABNORMAL HIGH (ref 70–99)
Glucose-Capillary: 207 mg/dL — ABNORMAL HIGH (ref 70–99)

## 2021-08-22 LAB — CK: Total CK: 681 U/L — ABNORMAL HIGH (ref 49–397)

## 2021-08-22 LAB — MRSA NEXT GEN BY PCR, NASAL: MRSA by PCR Next Gen: DETECTED — AB

## 2021-08-22 LAB — SEDIMENTATION RATE: Sed Rate: 58 mm/hr — ABNORMAL HIGH (ref 0–16)

## 2021-08-22 LAB — URIC ACID: Uric Acid, Serum: 6.4 mg/dL (ref 3.7–8.6)

## 2021-08-22 LAB — CREATININE, URINE, RANDOM: Creatinine, Urine: 165.39 mg/dL

## 2021-08-22 MED ORDER — ICOSAPENT ETHYL 1 G PO CAPS
2.0000 g | ORAL_CAPSULE | Freq: Two times a day (BID) | ORAL | Status: DC
  Administered 2021-08-22 – 2021-08-24 (×4): 2 g via ORAL
  Filled 2021-08-22 (×5): qty 2

## 2021-08-22 MED ORDER — CEPHALEXIN 500 MG PO CAPS: 500.0000 mg | ORAL_CAPSULE | Freq: Two times a day (BID) | ORAL | 0 refills | Status: AC

## 2021-08-22 MED ORDER — HYDRALAZINE HCL 25 MG PO TABS
25.0000 mg | ORAL_TABLET | Freq: Four times a day (QID) | ORAL | Status: DC | PRN
  Administered 2021-08-22 – 2021-08-23 (×2): 25 mg via ORAL
  Filled 2021-08-22 (×2): qty 1

## 2021-08-22 MED ORDER — ACETAMINOPHEN 325 MG PO TABS: 325.0000 mg | ORAL_TABLET | ORAL | Status: DC | PRN

## 2021-08-22 MED ORDER — TRAZODONE HCL 50 MG PO TABS
100.0000 mg | ORAL_TABLET | Freq: Every day | ORAL | Status: DC
  Administered 2021-08-22 – 2021-08-23 (×2): 100 mg via ORAL
  Filled 2021-08-22 (×2): qty 2

## 2021-08-22 MED ORDER — ACETAMINOPHEN 325 MG PO TABS
650.0000 mg | ORAL_TABLET | Freq: Once | ORAL | Status: AC
  Administered 2021-08-22: 650 mg via ORAL
  Filled 2021-08-22: qty 2

## 2021-08-22 MED ORDER — OXYBUTYNIN CHLORIDE 5 MG PO TABS
2.5000 mg | ORAL_TABLET | Freq: Two times a day (BID) | ORAL | Status: DC
  Administered 2021-08-22 – 2021-08-24 (×4): 2.5 mg via ORAL
  Filled 2021-08-22 (×5): qty 0.5

## 2021-08-22 MED ORDER — DOLUTEGRAVIR-LAMIVUDINE 50-300 MG PO TABS
1.0000 | ORAL_TABLET | Freq: Every day | ORAL | Status: DC
  Administered 2021-08-22 – 2021-08-24 (×3): 1 via ORAL
  Filled 2021-08-22 (×3): qty 1

## 2021-08-22 MED ORDER — PANTOPRAZOLE SODIUM 40 MG PO TBEC
40.0000 mg | DELAYED_RELEASE_TABLET | Freq: Every day | ORAL | Status: DC
  Administered 2021-08-22 – 2021-08-24 (×3): 40 mg via ORAL
  Filled 2021-08-22 (×3): qty 1

## 2021-08-22 MED ORDER — SODIUM CHLORIDE 0.9 % IV SOLN
2.0000 g | Freq: Once | INTRAVENOUS | Status: AC
  Administered 2021-08-22: 2 g via INTRAVENOUS
  Filled 2021-08-22: qty 20

## 2021-08-22 MED ORDER — DORAVIRINE 100 MG PO TABS
100.0000 mg | ORAL_TABLET | Freq: Every day | ORAL | Status: DC
  Administered 2021-08-22 – 2021-08-24 (×3): 100 mg via ORAL
  Filled 2021-08-22 (×3): qty 1

## 2021-08-22 MED ORDER — ACETAMINOPHEN 325 MG PO TABS: 650.0000 mg | ORAL_TABLET | Freq: Once | ORAL | Status: DC

## 2021-08-22 MED ORDER — CHLORHEXIDINE GLUCONATE CLOTH 2 % EX PADS
6.0000 | MEDICATED_PAD | Freq: Every day | CUTANEOUS | Status: DC
  Administered 2021-08-23 – 2021-08-24 (×2): 6 via TOPICAL

## 2021-08-22 MED ORDER — VANCOMYCIN HCL 1500 MG/300ML IV SOLN: 1500.0000 mg | INTRAVENOUS | Status: DC

## 2021-08-22 MED ORDER — SODIUM CHLORIDE 0.9 % IV SOLN
1.0000 g | INTRAVENOUS | Status: DC
  Administered 2021-08-23 – 2021-08-24 (×2): 1 g via INTRAVENOUS
  Filled 2021-08-22 (×2): qty 10

## 2021-08-22 MED ORDER — ALLOPURINOL 100 MG PO TABS
100.0000 mg | ORAL_TABLET | Freq: Every day | ORAL | Status: DC
  Administered 2021-08-23 – 2021-08-24 (×2): 100 mg via ORAL
  Filled 2021-08-22 (×2): qty 1

## 2021-08-22 MED ORDER — FERROUS SULFATE 325 (65 FE) MG PO TABS: 325.0000 mg | ORAL_TABLET | ORAL | Status: DC

## 2021-08-22 MED ORDER — DAPSONE 100 MG PO TABS
100.0000 mg | ORAL_TABLET | Freq: Every day | ORAL | Status: DC
  Administered 2021-08-22 – 2021-08-24 (×3): 100 mg via ORAL
  Filled 2021-08-22 (×3): qty 1

## 2021-08-22 MED ORDER — CARVEDILOL 3.125 MG PO TABS
3.1250 mg | ORAL_TABLET | Freq: Two times a day (BID) | ORAL | Status: DC
  Administered 2021-08-22 – 2021-08-24 (×4): 3.125 mg via ORAL
  Filled 2021-08-22 (×4): qty 1

## 2021-08-22 MED ORDER — SODIUM CHLORIDE 0.9 % IV SOLN: INTRAVENOUS | Status: AC

## 2021-08-22 MED ORDER — SODIUM CHLORIDE 0.9 % IV SOLN
2500.0000 mg | Freq: Once | INTRAVENOUS | Status: DC
  Administered 2021-08-22: 2500 mg via INTRAVENOUS
  Filled 2021-08-22: qty 2500

## 2021-08-22 MED ORDER — BUTALBITAL-APAP-CAFFEINE 50-325-40 MG PO TABS: 1.0000 | ORAL_TABLET | Freq: Four times a day (QID) | ORAL | Status: DC | PRN

## 2021-08-22 MED ORDER — SODIUM CHLORIDE 0.9 % IV BOLUS
1000.0000 mL | Freq: Once | INTRAVENOUS | Status: AC
  Administered 2021-08-22: 1000 mL via INTRAVENOUS

## 2021-08-22 MED ORDER — GABAPENTIN 100 MG PO CAPS
100.0000 mg | ORAL_CAPSULE | Freq: Three times a day (TID) | ORAL | Status: DC
  Administered 2021-08-22 – 2021-08-24 (×6): 100 mg via ORAL
  Filled 2021-08-22 (×6): qty 1

## 2021-08-22 MED ORDER — ASPIRIN 81 MG PO TBEC
81.0000 mg | DELAYED_RELEASE_TABLET | Freq: Every day | ORAL | Status: DC
  Administered 2021-08-23 – 2021-08-24 (×2): 81 mg via ORAL
  Filled 2021-08-22 (×2): qty 1

## 2021-08-22 MED ORDER — PROCHLORPERAZINE EDISYLATE 10 MG/2ML IJ SOLN
10.0000 mg | Freq: Once | INTRAMUSCULAR | Status: AC
  Administered 2021-08-22: 10 mg via INTRAVENOUS
  Filled 2021-08-22: qty 2

## 2021-08-22 MED ORDER — CEPHALEXIN 250 MG PO CAPS
500.0000 mg | ORAL_CAPSULE | Freq: Once | ORAL | Status: AC
  Administered 2021-08-22: 500 mg via ORAL
  Filled 2021-08-22: qty 2

## 2021-08-22 MED ORDER — ACETAMINOPHEN 325 MG PO TABS: 650.0000 mg | ORAL_TABLET | ORAL | Status: DC | PRN

## 2021-08-22 MED ORDER — ALPRAZOLAM 0.25 MG PO TABS: 0.5000 mg | ORAL_TABLET | Freq: Every day | ORAL | Status: DC | PRN

## 2021-08-22 MED ORDER — OXYCODONE HCL 5 MG PO TABS: 10.0000 mg | ORAL_TABLET | ORAL | Status: DC | PRN

## 2021-08-22 MED ORDER — DEXAMETHASONE SODIUM PHOSPHATE 10 MG/ML IJ SOLN
10.0000 mg | Freq: Once | INTRAMUSCULAR | Status: AC
  Administered 2021-08-22: 10 mg via INTRAVENOUS
  Filled 2021-08-22: qty 1

## 2021-08-22 MED ORDER — MUPIROCIN 2 % EX OINT
1.0000 "application " | TOPICAL_OINTMENT | Freq: Two times a day (BID) | CUTANEOUS | Status: DC
  Administered 2021-08-22 – 2021-08-24 (×4): 1 via NASAL
  Filled 2021-08-22: qty 22

## 2021-08-22 MED ORDER — HEPARIN SODIUM (PORCINE) 5000 UNIT/ML IJ SOLN
5000.0000 [IU] | Freq: Three times a day (TID) | INTRAMUSCULAR | Status: DC
  Administered 2021-08-22 – 2021-08-24 (×7): 5000 [IU] via SUBCUTANEOUS
  Filled 2021-08-22 (×6): qty 1

## 2021-08-22 MED ORDER — OXYCODONE-ACETAMINOPHEN 10-325 MG PO TABS: 1.0000 | ORAL_TABLET | ORAL | Status: DC | PRN

## 2021-08-22 MED ORDER — INSULIN ASPART 100 UNIT/ML IJ SOLN
0.0000 [IU] | Freq: Three times a day (TID) | INTRAMUSCULAR | Status: DC
  Administered 2021-08-22: 5 [IU] via SUBCUTANEOUS
  Administered 2021-08-23 – 2021-08-24 (×4): 1 [IU] via SUBCUTANEOUS

## 2021-08-22 MED ORDER — ATORVASTATIN CALCIUM 40 MG PO TABS
80.0000 mg | ORAL_TABLET | Freq: Every day | ORAL | Status: DC
  Administered 2021-08-23 – 2021-08-24 (×2): 80 mg via ORAL
  Filled 2021-08-22 (×2): qty 2

## 2021-08-22 NOTE — Progress Notes (Signed)
ESR=58 and review of patient's past lab results.  Patient has chronic ESR elevation probably due to his CKD.  Today's ER's are reading not particular elevated compared to the past ESRs, less likely to reflect acute etiology related to acute headache in this case. ?

## 2021-08-22 NOTE — ED Provider Notes (Signed)
?Keith [name] ?Provider Note ? ? ?CSN: [id number] ?Arrival date & time: 08/21/21  2311 ? ?  ? ?History ? ?Chief Complaint  ?Patient presents with  ? Headache  ? ? ?Keith [name] is a 56 y.o. male. ? ? ?Headache ?Associated symptoms: no congestion, no diarrhea, no fever, no nausea, no seizures and no vomiting   ? ?56 year old male presents emergency department with 1 week history of headache.  He states that the headache has been gradual onset and fluctuating in severity over the past week.  He describes the headache as dull, right-sided temple pressure without radiation.  He is sensitive to light but not sound.  He said he has a history of headaches like this, but they have not been as persistent as this 1.  He states he has been out of all of his medicine for the past week.  He was at a nursing facility where he was discharged last week, but they did not supply him with medicine upon discharge.  He is in contact with the pharmacy and is due to receive his medication on Friday.  He denies visual changes, changes in coordination, facial droop, dysarthria, weakness, sensory deficits, pronator drift.  Denies chest pain, shortness of breath, abdominal pain, nausea/vomiting/diarrhea, urinary symptoms, change in bowel habits. ? ?Past medical history significant for CVA, osteomyelitis with bilateral BKA, HIV AIDS, anemia, CAD, diabetes mellitus, GERD, hyperlipidemia, hypertension, MI ? ?Home Medications ?Prior to Admission medications   ?Medication Sig Start Date End Date Taking? Authorizing Provider  ?acetaminophen (TYLENOL) 325 MG tablet Take 2 tablets (650 mg total) by mouth every 4 (four) hours as needed for mild pain, fever or headache. ?Patient taking differently: Take 650 mg by mouth 3 (three) times daily as needed for mild pain, fever or headache. 05/03/20  Yes Debbe Odea, MD  ?cephALEXin (KEFLEX) 500 MG capsule Take 1 capsule (500 mg total) by mouth 2 (two) times daily for 6  days. 08/22/21 08/28/21 Yes Wilnette Kales, PA  ?allopurinol (ZYLOPRIM) 100 MG tablet Take 1 tablet (100 mg total) by mouth daily. 12/26/20 08/22/21  [name], MD  ?ALPRAZolam [name]) 1 MG tablet Take 1 tablet (1 mg total) by mouth daily as needed for anxiety. 02/13/21   Golden Circle, FNP  ?aspirin 81 MG EC tablet Take 1 tablet (81 mg total) by mouth daily. 03/27/20   [name], MD  ?atorvastatin (LIPITOR) 80 MG tablet Take 1 tablet (80 mg total) by mouth daily. 09/01/20 01/02/22  [name], MD  ?blood glucose meter kit and supplies Dispense based on patient and insurance preference. Use up to four times daily as directed. (FOR ICD-10 E10.9, E11.9). 10/17/20   [name], NP  ?carvedilol (COREG) 3.125 MG tablet Take 1 tablet (3.125 mg total) by mouth 2 (two) times daily. 12/26/20 01/25/21  [name], MD  ?dapsone 100 MG tablet TAKE 1 TABLET (100 MG TOTAL) BY MOUTH DAILY. 02/13/21 02/13/22  Golden Circle, FNP  ?dolutegravir-lamiVUDine (DOVATO) 50-300 MG tablet Take 1 tablet by mouth daily. 02/13/21   Golden Circle, FNP  ?doravirine (PIFELTRO) 100 MG TABS tablet Take 1 tablet (100 mg total) by mouth daily. 02/13/21   Golden Circle, FNP  ?escitalopram (LEXAPRO) 20 MG tablet Take 1 tablet (20 mg total) by mouth daily as needed. 02/13/21   Golden Circle, FNP  ?ferrous sulfate 325 (65 FE) MG tablet Take 1 tablet (325 mg total) by mouth every Monday, Wednesday,  and Friday at 6 PM. Do not take with other medicines. 12/27/20 01/26/21  Elodia Florence., MD  ?gabapentin (NEURONTIN) 100 MG capsule Take 1 capsule (100 mg total) by mouth 3 (three) times daily. 09/01/20 01/02/22  [name], MD  ?glucose blood (COOL BLOOD GLUCOSE TEST STRIPS) test strip Use as instructed 02/16/20   [name], FNP  ?icosapent Ethyl (VASCEPA) 1 g capsule TAKE 2 CAPSULES (2 G TOTAL) BY MOUTH TWO TIMES DAILY. ?Patient taking differently: Take 2 g by mouth 2 (two) times daily. 03/27/20 03/27/21   [name], MD  ?Lancets [facility] ULTRASOFT) lancets Use as instructed 02/16/20   [name], FNP  ?nitroGLYCERIN (NITROSTAT) 0.4 MG SL tablet PLACE 1 TABLET (0.4 MG TOTAL) UNDER THE TONGUE EVERY FIVE MINUTES X 3 DOSES AS NEEDED FOR CHEST PAIN. ?Patient taking differently: Place 0.4 mg under the tongue every 5 (five) minutes x 3 doses as needed for chest pain. 03/27/20 03/27/21  [name], Ava, DO  ?oxybutynin (DITROPAN) 5 MG tablet Take 1/2 tablet (2.5 mg total) by mouth 2 (two) times daily. 12/26/20   [name], MD  ?oxyCODONE-acetaminophen (PERCOCET) 10-325 MG tablet Take 1 tablet by mouth every 4 (four) hours as needed for pain. 01/10/21   [name], MD  ?pantoprazole (PROTONIX) 40 MG tablet Take 1 tablet (40 mg total) by mouth daily. 08/17/21 09/16/21  [name], MD  ?traZODone (DESYREL) 100 MG tablet Take 100 mg by mouth at bedtime. 09/11/20   [provider]  ?   ? ?Allergies    ?Elemental sulfur and Sulfa antibiotics   ? ?Review of Systems   ?Review of Systems  ?Constitutional:  Negative for chills and fever.  ?HENT:  Negative for congestion.   ?Cardiovascular:  Negative for chest pain.  ?Gastrointestinal:  Negative for diarrhea, nausea and vomiting.  ?Genitourinary:  Negative for dysuria.  ?Neurological:  Positive for headaches. Negative for seizures and syncope.  ?All other systems reviewed and are negative. ? ?Physical Exam ?Updated Vital Signs ?BP (!) 191/111 (BP Location: Left Arm)   Pulse 66   Temp 98.7 ?F (37.1 ?C) (Oral)   Resp 20   Ht $R'5\' 9"'DE$  (1.753 m)   Wt 120.2 kg   SpO2 97%   BMI 39.13 kg/m?  ?Physical Exam ?Vitals and nursing note reviewed.  ?Constitutional:   ?   General: He is not in acute distress. ?   Appearance: He is well-developed. He is not ill-appearing, toxic-appearing or diaphoretic.  ?HENT:  ?   Head: Normocephalic and atraumatic.  ?   Mouth/Throat:  ?   Mouth: Mucous membranes are moist.  ?   Pharynx: Oropharynx is clear.  ?Eyes:  ?    General: No visual field deficit. ?   Extraocular Movements: Extraocular movements intact.  ?   Pupils: Pupils are equal, round, and reactive to light.  ?Cardiovascular:  ?   Rate and Rhythm: Normal rate and regular rhythm.  ?   Heart sounds: Normal heart sounds.  ?Pulmonary:  ?   Effort: Pulmonary effort is normal.  ?   Breath sounds: Normal breath sounds. No wheezing or rales.  ?Abdominal:  ?   General: Bowel sounds are normal.  ?   Palpations: Abdomen is soft.  ?   Tenderness: There is no abdominal tenderness.  ?Musculoskeletal:     ?   General: No tenderness. Normal range of motion.  ?   Cervical back: Normal range of motion and neck supple. No  rigidity.  ?   Comments: Bilateral BKA noted.  No signs of cellulitic skin changes on bilateral leg stump.  No tenderness elicited upon palpation.  ?Skin: ?   General: Skin is warm and dry.  ?   Capillary Refill: Capillary refill takes less than 2 seconds.  ?Neurological:  ?   Mental Status: He is alert and oriented to person, place, and time.  ?   Cranial Nerves: No dysarthria or facial asymmetry.  ?   Sensory: Sensation is intact. No sensory deficit.  ?   Motor: Motor function is intact. No weakness or pronator drift.  ?   Coordination: Coordination is intact. Romberg sign negative. Finger-Nose-Finger Test and Heel to Bentley Test normal.  ?   Deep Tendon Reflexes: Reflexes normal.  ?   Comments: Cranial nerves III through XII grossly intact.  Muscle strength 5 out of 5 for upper and lower extremities.  No sensory deficits along major nerve distributions of upper and lower extremities.  Normal and symmetric.  ?Psychiatric:     ?   Mood and Affect: Mood normal.     ?   Behavior: Behavior normal.  ? ? ?ED Results / Procedures / Treatments   ?Labs ?(all labs ordered are listed, but only abnormal results are displayed) ?Labs Reviewed  ?CBC WITH DIFFERENTIAL/PLATELET - Abnormal; Notable for the following components:  ?    Result Value  ? RBC 3.76 (*)   ? Hemoglobin 11.6 (*)   ?  HCT 36.7 (*)   ? All other components within normal limits  ?COMPREHENSIVE METABOLIC PANEL - Abnormal; Notable for the following components:  ? Chloride 113 (*)   ? CO2 20 (*)   ? Glucose, Bld 142 (*)   ? BUN 43 Marland Kitchen

## 2021-08-22 NOTE — ED Triage Notes (Signed)
Pt c/o headache x 1 week since being out of all meds, pt reports he was recently discharged from a rehab facility following a left leg amputation, denies blurry or double vision, pt a&o x 4 ?

## 2021-08-22 NOTE — H&P (Addendum)
History and Physical    Keith [name]:[id number] DOB: 04/24/1965 DOA: 08/21/2021  PCP: Barbette Merino, NP (Confirm with patient/family/NH records and if not entered, this has to be entered at [facility] point of entry) Patient coming from: Home  I have personally briefly reviewed patient's old medical records in [facility]  Chief Complaint: Headache  HPI: Keith [name] is a 56 y.o. male with medical history significant of HIV on HAART, HTN, IIDM, HLD, CKD stage II, gout, obesity, status post bilateral BKA, anxiety/depression, chronic narcotic use, presented with worsening of headaches.  Patient started to have right-sided headache on and off, for about 1 month while he was residing in a rehab center last month.  He was given as needed medication for headache with some success.  Last Friday, he was discharged home, however he has not been able to refill his blood pressure medications for last 3 days.  Increasingly, he has had worsening of headaches, localized on the right frontal area, sometimes associated with fear of light, denies any blurry vision, no double vision, nauseous vomiting, no numbness or weakness of any of the limbs.  He denies any fever chills, no neck pain no muscle ache or joint pains.  I 1 time, he has BPH, but he denied taking any of the BPH medications.  Denies any urinary problems including dysuria, urinary frequency, no back pain.  Denies any decrease of urine output or urine color changes.  ED Course: Blood pressure significant elevated, CT head negative for acute findings.  Blood work creatinine 3.6 compared to 3.55 days ago and 1.2, 6 months ago.  ED gave vancomycin and ceftriaxone  Review of Systems: As per HPI otherwise 14 point review of systems negative.    Past Medical History:  Diagnosis Date   Abscess of right foot    abscess/ulcer of R transtibial amputation requiring IV abx   AIDS (HCC)    Anemia    CAD (coronary artery disease)    a. MI with  stenting of OM1 in 11/2018 with residual disease. b. acute STEMI 03/2020 s/p DES to OM1   Chronic anemia    Chronic knee pain    right   Chronic pain    CKD (chronic kidney disease), stage IV (HCC)    CVA (cerebral vascular accident) (HCC) 04/2020   Diabetes type 2, uncontrolled    HgA1c 17.6 (04/27/2010)   Diabetic foot ulcer (HCC) 01/2017   right foot   Dilatation of aorta (HCC)    Erectile dysfunction    Genital warts    GERD (gastroesophageal reflux disease)    History of blood transfusion    HIV (human immunodeficiency virus infection) (HCC) 2009   CD4 count 100, VL 13800 (05/01/2010)   Hyperlipidemia    Hypertension    Myocardial infarction (HCC)    Neuropathy    Noncompliance with medication regimen    Osteomyelitis (HCC)    h/o hand   Osteomyelitis of metatarsal (HCC) 04/28/2017   Pneumonia     Past Surgical History:  Procedure Laterality Date   AMPUTATION Right 05/02/2017   Procedure: AMPUTATION TRANSMETARSAL;  Surgeon: Nadara Mustard, MD;  Location: [facility] OR;  Service: Orthopedics;  Laterality: Right;   AMPUTATION Right 06/13/2017   Procedure: RIGHT BELOW KNEE AMPUTATION;  Surgeon: Nadara Mustard, MD;  Location: [facility] OR;  Service: Orthopedics;  Laterality: Right;   AMPUTATION Left 10/27/2020   Procedure: LEFT BELOW KNEE AMPUTATION;  Surgeon: Nadara Mustard, MD;  Location: [facility]  OR;  Service: Orthopedics;  Laterality: Left;   AMPUTATION Left 11/17/2020   Procedure: REVISION AMPUTATION BELOW KNEE, LEFT;  Surgeon: Nadara Mustard, MD;  Location: [facility] OR;  Service: Orthopedics;  Laterality: Left;   AMPUTATION Left 01/03/2021   Procedure: LEFT ABOVE KNEE AMPUTATION;  Surgeon: Nadara Mustard, MD;  Location: [facility] OR;  Service: Orthopedics;  Laterality: Left;   APPLICATION OF WOUND VAC Left 10/27/2020   Procedure: APPLICATION OF WOUND VAC;  Surgeon: Nadara Mustard, MD;  Location: [facility] OR;  Service: Orthopedics;  Laterality: Left;   BELOW KNEE LEG AMPUTATION Right 06/13/2017   BELOW KNEE LEG  AMPUTATION     CORONARY STENT INTERVENTION N/A 11/10/2018   Procedure: CORONARY STENT INTERVENTION;  Surgeon: Marykay Lex, MD;  Location: [facility] INVASIVE CV LAB;  Service: Cardiovascular;  Laterality: N/A;   CORONARY/GRAFT ACUTE MI REVASCULARIZATION N/A 03/21/2020   Procedure: Coronary/Graft Acute MI Revascularization;  Surgeon: Runell Gess, MD;  Location: [facility] INVASIVE CV LAB;  Service: Cardiovascular;  Laterality: N/A;   CYSTOSCOPY N/A 11/09/2020   Procedure: CYSTOSCOPY, EVACUATION OF CLOTS, FULGERATION OF BLADDER AND BILATERIAL RETROGRADE PYELOGRAMS;  Surgeon: Jannifer Hick, MD;  Location: [facility] OR;  Service: Urology;  Laterality: N/A;   HERNIA REPAIR     I & D EXTREMITY Left 08/21/2014   Procedure: INCISION AND DRAINAGE LEFT SMALL FINGER;  Surgeon: Betha Loa, MD;  Location: [facility] OR;  Service: Orthopedics;  Laterality: Left;   I & D EXTREMITY Right 03/18/2017   Procedure: IRRIGATION AND DEBRIDEMENT EXTREMITY;  Surgeon: Nadara Mustard, MD;  Location: [facility] OR;  Service: Orthopedics;  Laterality: Right;   IR FLUORO GUIDE CV LINE RIGHT  11/02/2020   IR REMOVAL TUN CV CATH W/O FL  11/13/2020   IR US GUIDE VASC ACCESS RIGHT  11/02/2020   LEFT HEART CATH AND CORONARY ANGIOGRAPHY N/A 11/10/2018   Procedure: LEFT HEART CATH AND CORONARY ANGIOGRAPHY;  Surgeon: Marykay Lex, MD;  Location: [facility] INVASIVE CV LAB;  Service: Cardiovascular;  Laterality: N/A;   LEFT HEART CATH AND CORONARY ANGIOGRAPHY N/A 03/21/2020   Procedure: LEFT HEART CATH AND CORONARY ANGIOGRAPHY;  Surgeon: Runell Gess, MD;  Location: [facility] INVASIVE CV LAB;  Service: Cardiovascular;  Laterality: N/A;   MINOR IRRIGATION AND DEBRIDEMENT OF WOUND Right 04/22/2014   Procedure: IRRIGATION AND DEBRIDEMENT OF RIGHT NECK ABCESS;  Surgeon: Osborn Coho, MD;  Location: [facility] OR;  Service: ENT;  Laterality: Right;   MULTIPLE EXTRACTIONS WITH ALVEOLOPLASTY N/A 01/18/2013   Procedure: MULTIPLE EXTRACION 3, 6, 7, 10, 11, 13, 21, 22, 27, 28, 29, 30 WITH  ALVEOLOPLASTY;  Surgeon: Georgia Lopes, DDS;  Location: [facility] OR;  Service: Oral Surgery;  Laterality: N/A;   SKIN SPLIT GRAFT Right 03/21/2017   Procedure: IRRIGATION AND DEBRIDEMENT RIGHT FOOT AND APPLY SPLIT THICKNESS SKIN GRAFT AND WOUND VAC;  Surgeon: Nadara Mustard, MD;  Location: [facility] OR;  Service: Orthopedics;  Laterality: Right;   STUMP REVISION Left 12/02/2020   Procedure: REVISION LEFT BELOW KNEE AMPUTATION;  Surgeon: Nadara Mustard, MD;  Location: [facility] OR;  Service: Orthopedics;  Laterality: Left;   TEE WITHOUT CARDIOVERSION N/A 05/02/2017   Procedure: TRANSESOPHAGEAL ECHOCARDIOGRAM (TEE);  Surgeon: Elease Hashimoto Deloris Marckus Hanover, MD;  Location: [facility] OR;  Service: Cardiovascular;  Laterality: N/A;  coincidental to orthopedic case     reports that he has never smoked. He has never used smokeless tobacco. He reports that he does not currently use alcohol. He reports that he does not  use drugs.  Allergies  Allergen Reactions   Elemental Sulfur Itching    Patient stated he's allergic to "sulfur" AND "sulfa"   Sulfa Antibiotics Itching    Family History  Problem Relation Age of Onset   Hypertension Mother    Arthritis Father    Hypertension Father    Hypertension Brother    Cancer Maternal Grandmother 53       unknown type of cancer   Depression Paternal Grandmother      Prior to Admission medications   Medication Sig Start Date End Date Taking? Authorizing Provider  cephALEXin (KEFLEX) 500 MG capsule Take 1 capsule (500 mg total) by mouth 2 (two) times daily for 6 days. 08/22/21 08/28/21 Yes Peter Garter, PA  acetaminophen (TYLENOL) 325 MG tablet Take 2 tablets (650 mg total) by mouth every 4 (four) hours as needed for mild pain, fever or headache. 05/03/20   Calvert Cantor, MD  allopurinol (ZYLOPRIM) 100 MG tablet Take 1 tablet (100 mg total) by mouth daily. 12/26/20 01/25/21  Standley Brooking, MD  ALPRAZolam Prudy Feeler) 1 MG tablet Take 1 tablet (1 mg total) by mouth daily as needed for anxiety.  02/13/21   Veryl Speak, FNP  aspirin 81 MG EC tablet Take 1 tablet (81 mg total) by mouth daily. 03/27/20   Runell Gess, MD  atorvastatin (LIPITOR) 80 MG tablet Take 1 tablet (80 mg total) by mouth daily. 09/01/20 01/02/22  Lanae Boast, MD  blood glucose meter kit and supplies Dispense based on patient and insurance preference. Use up to four times daily as directed. (FOR ICD-10 E10.9, E11.9). 10/17/20   Barbette Merino, NP  carvedilol (COREG) 3.125 MG tablet Take 1 tablet (3.125 mg total) by mouth 2 (two) times daily. 12/26/20 01/25/21  Standley Brooking, MD  dapsone 100 MG tablet TAKE 1 TABLET (100 MG TOTAL) BY MOUTH DAILY. 02/13/21 02/13/22  Veryl Speak, FNP  dolutegravir-lamiVUDine (DOVATO) 50-300 MG tablet Take 1 tablet by mouth daily. 02/13/21   Veryl Speak, FNP  doravirine (PIFELTRO) 100 MG TABS tablet Take 1 tablet (100 mg total) by mouth daily. 02/13/21   Veryl Speak, FNP  escitalopram (LEXAPRO) 20 MG tablet Take 1 tablet (20 mg total) by mouth daily as needed. 02/13/21   Veryl Speak, FNP  ferrous sulfate 325 (65 FE) MG tablet Take 1 tablet (325 mg total) by mouth every Monday, Wednesday, and Friday at 6 PM. Do not take with other medicines. 12/27/20 01/26/21  Zigmund Daniel., MD  gabapentin (NEURONTIN) 100 MG capsule Take 1 capsule (100 mg total) by mouth 3 (three) times daily. 09/01/20 01/02/22  Lanae Boast, MD  glucose blood (COOL BLOOD GLUCOSE TEST STRIPS) test strip Use as instructed 02/16/20   Kallie Locks, FNP  icosapent Ethyl (VASCEPA) 1 g capsule TAKE 2 CAPSULES (2 G TOTAL) BY MOUTH TWO TIMES DAILY. Patient taking differently: Take 2 g by mouth 2 (two) times daily. 03/27/20 03/27/21  Runell Gess, MD  Lancets [facility] ULTRASOFT) lancets Use as instructed 02/16/20   Kallie Locks, FNP  nitroGLYCERIN (NITROSTAT) 0.4 MG SL tablet PLACE 1 TABLET (0.4 MG TOTAL) UNDER THE TONGUE EVERY FIVE MINUTES X 3 DOSES AS NEEDED FOR CHEST PAIN. Patient  taking differently: Place 0.4 mg under the tongue every 5 (five) minutes x 3 doses as needed for chest pain. 03/27/20 03/27/21  [name], Ava, DO  oxybutynin (DITROPAN) 5 MG tablet Take 1/2 tablet (2.5 mg total) by mouth  2 (two) times daily. 12/26/20   Standley Brooking, MD  oxyCODONE-acetaminophen (PERCOCET) 10-325 MG tablet Take 1 tablet by mouth every 4 (four) hours as needed for pain. 01/10/21   Nadara Mustard, MD  pantoprazole (PROTONIX) 40 MG tablet Take 1 tablet (40 mg total) by mouth daily. 08/17/21 09/16/21  Ginnie Smart, MD  traZODone (DESYREL) 100 MG tablet Take 100 mg by mouth at bedtime. 09/11/20   [provider]    Physical Exam: Vitals:   08/22/21 0623 08/22/21 0745 08/22/21 0815 08/22/21 1231  BP: (!) 173/100 (!) 147/86 (!) 166/84 (!) 191/111  Pulse: 61 67 63 66  Resp: 17  14 20   Temp: 98.7 F (37.1 C)     TempSrc: Oral     SpO2: 100% 96% 98% 97%  Weight:      Height:        Constitutional: NAD, calm, comfortable Vitals:   08/22/21 0623 08/22/21 0745 08/22/21 0815 08/22/21 1231  BP: (!) 173/100 (!) 147/86 (!) 166/84 (!) 191/111  Pulse: 61 67 63 66  Resp: 17  14 20   Temp: 98.7 F (37.1 C)     TempSrc: Oral     SpO2: 100% 96% 98% 97%  Weight:      Height:       Eyes: PERRL, lids and conjunctivae normal ENMT: Mucous membranes are moist. Posterior pharynx clear of any exudate or lesions.Normal dentition.  Neck: normal, supple, no masses, no thyromegaly Respiratory: clear to auscultation bilaterally, no wheezing, no crackles. Normal respiratory effort. No accessory muscle use.  Cardiovascular: Regular rate and rhythm, no murmurs / rubs / gallops. No extremity edema. 2+ pedal pulses. No carotid bruits.  Abdomen: no tenderness, no masses palpated. No hepatosplenomegaly. Bowel sounds positive.  Musculoskeletal: no clubbing / cyanosis. No joint deformity upper and lower extremities. Good ROM, no contractures. Normal muscle tone.  Skin: no rashes, lesions,  ulcers. No induration Neurologic: CN 2-12 grossly intact. Sensation intact, DTR normal. Strength 5/5 in all 4.  Psychiatric: Normal judgment and insight. Alert and oriented x 3. Normal mood.     Labs on Admission: I have personally reviewed following labs and imaging studies  CBC: Recent Labs  Lab 08/17/21 1215 08/22/21 0031  WBC 6.1 7.0  NEUTROABS 3,794 4.9  HGB 10.6* 11.6*  HCT 32.7* 36.7*  MCV 93.4 97.6  PLT 216 264   Basic Metabolic Panel: Recent Labs  Lab 08/17/21 1215 08/22/21 0031  NA 137 139  K 5.2 4.4  CL 108 113*  CO2 23 20*  GLUCOSE 149* 142*  BUN 38* 43*  CREATININE 3.51* 3.60*  CALCIUM 8.8 9.1   GFR: Estimated Creatinine Clearance: 29.3 mL/min (A) (by C-G formula based on SCr of 3.6 mg/dL (H)). Liver Function Tests: Recent Labs  Lab 08/17/21 1215 08/22/21 0031  AST 16 21  ALT 12 19  ALKPHOS  --  79  BILITOT 0.5 0.5  PROT 7.0 7.7  ALBUMIN  --  3.4*   No results for input(s): LIPASE, AMYLASE in the last 168 hours. No results for input(s): AMMONIA in the last 168 hours. Coagulation Profile: No results for input(s): INR, PROTIME in the last 168 hours. Cardiac Enzymes: No results for input(s): CKTOTAL, CKMB, CKMBINDEX, TROPONINI in the last 168 hours. BNP (last 3 results) No results for input(s): PROBNP in the last 8760 hours. HbA1C: No results for input(s): HGBA1C in the last 72 hours. CBG: Recent Labs  Lab 08/22/21 0058  GLUCAP 119*   Lipid Profile:  No results for input(s): CHOL, HDL, LDLCALC, TRIG, CHOLHDL, LDLDIRECT in the last 72 hours. Thyroid Function Tests: No results for input(s): TSH, T4TOTAL, FREET4, T3FREE, THYROIDAB in the last 72 hours. Anemia Panel: No results for input(s): VITAMINB12, FOLATE, FERRITIN, TIBC, IRON, RETICCTPCT in the last 72 hours. Urine analysis:    Component Value Date/Time   COLORURINE YELLOW 08/22/2021 0025   APPEARANCEUR CLOUDY (A) 08/22/2021 0025   LABSPEC 1.017 08/22/2021 0025   PHURINE 5.0  08/22/2021 0025   GLUCOSEU 50 (A) 08/22/2021 0025   HGBUR SMALL (A) 08/22/2021 0025   BILIRUBINUR NEGATIVE 08/22/2021 0025   BILIRUBINUR negative 09/07/2018 1034   KETONESUR NEGATIVE 08/22/2021 0025   PROTEINUR 100 (A) 08/22/2021 0025   UROBILINOGEN 1.0 09/07/2018 1034   UROBILINOGEN 1.0 01/01/2017 1032   NITRITE NEGATIVE 08/22/2021 0025   LEUKOCYTESUR MODERATE (A) 08/22/2021 0025    Radiological Exams on Admission: CT Head Wo Contrast  Result Date: 08/22/2021 CLINICAL DATA:  Headache EXAM: CT HEAD WITHOUT CONTRAST TECHNIQUE: Contiguous axial images were obtained from the base of the skull through the vertex without intravenous contrast. RADIATION DOSE REDUCTION: This exam was performed according to the departmental dose-optimization program which includes automated exposure control, adjustment of the mA and/or kV according to patient size and/or use of iterative reconstruction technique. COMPARISON:  04/27/2020 FINDINGS: Brain: There is no mass, hemorrhage or extra-axial collection. The size and configuration of the ventricles and extra-axial CSF spaces are normal. The brain parenchyma is normal, without acute or chronic infarction. Vascular: No abnormal hyperdensity of the major intracranial arteries or dural venous sinuses. No intracranial atherosclerosis. Skull: The visualized skull base, calvarium and extracranial soft tissues are normal. Sinuses/Orbits: No fluid levels or advanced mucosal thickening of the visualized paranasal sinuses. No mastoid or middle ear effusion. The orbits are normal. IMPRESSION: Normal head CT. Electronically Signed   By: Deatra Robinson M.D.   On: 08/22/2021 01:03    EKG: Ordered  Assessment/Plan Principal Problem:   AKI (acute kidney injury) (HCC) Active Problems:   HTN (hypertension)   HIV disease (HCC)   Hyperglycemia due to diabetes mellitus (HCC)   Headache  (please populate well all problems here in Problem List. (For example, if patient is on BP meds  at home and you resume or decide to hold them, it is a problem that needs to be her. Same for CAD, COPD, HLD and so on)  AKI vs worsening of CKD stage II baseline -No significant urinary symptoms or back pains.  Might have a new bacteriuria, will be discussed below. -Clinically looks euvolemic, etiology of AKI probably related to uncontrolled hypertension/hypertensive nephropathy given there is also a concurrent persistent proteinuria, his diabetes is fairly controlled with recent A1c 6.0 without any diabetic medications at this point. -Clinically, patient does not have any neck pain, no fever and no white count and no mentation changes.  And patient's CD4=129 in May, thus unlikely CNS infections, will D/C all antibiotics. -Check renal ultrasound, FeNa, uric acid and CK level -Trial of hydration x24 hours, if kidney function remains stable, likely can be discharged home and follow-up with GI as outpatient.  Headache -Ddx including uncontrolled hypertension, tension headaches, polypharmacy/narcotic related headache. -Check ESR, clinically less suspicious of GCA. -Trial of Fioricet -Discussed about cautious usage of narcotics, and patient agreed.  Bacteriuria -Given there is a kidney function fluctuations, decided to treat 1 course of antibiotics. -Check PVR and consider restart Flomax  HTN, uncontrolled -Secondary to known coherent with HTN medications, restart Coreg,  add as needed hydralazine.  Proteinuria -Check microalbumin/creatinine ratio -Outpatient nephrology follow-up  HIV -Patient claims that he has been compliant with HAART, most recent CD4 count 129, viral copy<20.  Continue Dovato and prophylactic dapsone.  IIDM -Controlled, A1c= 6.0.  Polypharmacy -Discussed about narcotic use.  Chronic pain syndrome -Gabapentin is renally dosed.  Obesity -Calorie control  DVT prophylaxis: Heparin subcu Code Status: Full code Family Communication: None at bedside Disposition  Plan: Expect less than 2 midnight hospital stay Consults called: None Admission status: MedSurg observation   Emeline General MD [facility] Pager [phone]  08/22/2021, 1:30 PM

## 2021-08-22 NOTE — ED Provider Triage Note (Signed)
Emergency Medicine Provider Triage Evaluation Note ? ?Keith [name] , a 56 y.o. male  was evaluated in triage.  Pt complains of headache for the past 1 week.  Reports constant pain, right-sided, gradual onset with steady progression.  No alleviating or aggravating factors.  Has also had some nausea with one episode of vomiting/dry heaving.  He has been out of his medications for 1 week.  He was recently in rehab status post left lower extremity amputation..  Denies history of similar headaches. ? ?Review of Systems  ?Positive: Headache, nausea, vomiting ?Negative: Diplopia, loss of vision, numbness, weakness, abdominal pain ? ?Physical Exam  ?BP 125/85 (BP Location: Right Arm)   Pulse 66   Temp (!) 97.4 ?F (36.3 ?C) (Oral)   Resp 18   SpO2 93%  ?Gen:   Awake, no distress   ?Resp:  Normal effort  ?MSK:   Moves extremities without difficulty left lower extremity status post amputation. ?Other:  Pupils equal round and reactive to light.  No facial droop.  Sensation grossly intact to bilateral upper extremities.  5 out of 5 symmetric grip strength. ? ?Medical Decision Making  ?Medically screening exam initiated at 12:24 AM.  Appropriate orders placed.  VISHWA DAIS was informed that the remainder of the evaluation will be completed by another provider, this initial triage assessment does not replace that evaluation, and the importance of remaining in the ED until their evaluation is complete. ? ?Headache. ?  Amaryllis Dyke, PA-C ?08/22/21 [id number] ? ?

## 2021-08-22 NOTE — Progress Notes (Signed)
Pharmacy Antibiotic Note ? ?Keith [name] is a 56 y.o. male admitted on 08/21/2021 with meningitis.  Pharmacy has been consulted for vancomycin dosing. ? ?WBC 7, afebrile ? ?Plan: ?Vancomycin 2500mg  IV x 1, followed by ?Vancomycin 1500mg  IV q48h as per nomogram ?Goal vanc trough level: 15-20 ?Check trough level at steady state ?Monitor daily WBC, temp, SCr, and for clinical s/sx of infection ?F/u cultures and de-escalate as able ? ?Height: 5\' 9"  (175.3 cm) ?Weight: 120.2 kg (265 lb) ?IBW/kg (Calculated) : 70.7 ? ?Temp (24hrs), Avg:98 ?F (36.7 ?C), Min:97.4 ?F (36.3 ?C), Max:98.7 ?F (37.1 ?C) ? ?Recent Labs  ?Lab 08/17/21 ?1215 08/22/21 ?0031  ?WBC 6.1 7.0  ?CREATININE 3.51* 3.60*  ?  ?Estimated Creatinine Clearance: 29.3 mL/min (A) (by C-G formula based on SCr of 3.6 mg/dL (H)).   ? ?Allergies  ?Allergen Reactions  ? Elemental Sulfur Itching  ?  Patient stated he's allergic to "sulfur" AND "sulfa"  ? Sulfa Antibiotics Itching  ? ? ?Antimicrobials this admission: ?Cephalexin x1 on 5/17 ?Ceftriaxone x1 on 5/17 ?Vancomycin 5/17 >> ? ? ?Dose adjustments this admission: ?N/A ? ?Microbiology results: ?None to date ? ?Thank you for allowing pharmacy to be a part of this patient?s care. ? ?Kaleen Mask ?08/22/2021 11:24 AM ? ?

## 2021-08-23 DIAGNOSIS — Z6839 Body mass index (BMI) 39.0-39.9, adult: Secondary | ICD-10-CM | POA: Diagnosis not present

## 2021-08-23 DIAGNOSIS — B20 Human immunodeficiency virus [HIV] disease: Secondary | ICD-10-CM | POA: Diagnosis present

## 2021-08-23 DIAGNOSIS — N400 Enlarged prostate without lower urinary tract symptoms: Secondary | ICD-10-CM | POA: Diagnosis present

## 2021-08-23 DIAGNOSIS — Z882 Allergy status to sulfonamides status: Secondary | ICD-10-CM | POA: Diagnosis not present

## 2021-08-23 DIAGNOSIS — Z955 Presence of coronary angioplasty implant and graft: Secondary | ICD-10-CM | POA: Diagnosis not present

## 2021-08-23 DIAGNOSIS — I10 Essential (primary) hypertension: Secondary | ICD-10-CM

## 2021-08-23 DIAGNOSIS — R519 Headache, unspecified: Secondary | ICD-10-CM | POA: Diagnosis present

## 2021-08-23 DIAGNOSIS — E1165 Type 2 diabetes mellitus with hyperglycemia: Secondary | ICD-10-CM

## 2021-08-23 DIAGNOSIS — Z89511 Acquired absence of right leg below knee: Secondary | ICD-10-CM | POA: Diagnosis not present

## 2021-08-23 DIAGNOSIS — G894 Chronic pain syndrome: Secondary | ICD-10-CM | POA: Diagnosis present

## 2021-08-23 DIAGNOSIS — F419 Anxiety disorder, unspecified: Secondary | ICD-10-CM | POA: Diagnosis present

## 2021-08-23 DIAGNOSIS — N3000 Acute cystitis without hematuria: Secondary | ICD-10-CM | POA: Diagnosis present

## 2021-08-23 DIAGNOSIS — I129 Hypertensive chronic kidney disease with stage 1 through stage 4 chronic kidney disease, or unspecified chronic kidney disease: Secondary | ICD-10-CM | POA: Diagnosis present

## 2021-08-23 DIAGNOSIS — I252 Old myocardial infarction: Secondary | ICD-10-CM | POA: Diagnosis not present

## 2021-08-23 DIAGNOSIS — D631 Anemia in chronic kidney disease: Secondary | ICD-10-CM | POA: Diagnosis present

## 2021-08-23 DIAGNOSIS — M109 Gout, unspecified: Secondary | ICD-10-CM | POA: Diagnosis present

## 2021-08-23 DIAGNOSIS — N3001 Acute cystitis with hematuria: Secondary | ICD-10-CM | POA: Diagnosis present

## 2021-08-23 DIAGNOSIS — Z89512 Acquired absence of left leg below knee: Secondary | ICD-10-CM | POA: Diagnosis not present

## 2021-08-23 DIAGNOSIS — I2510 Atherosclerotic heart disease of native coronary artery without angina pectoris: Secondary | ICD-10-CM | POA: Diagnosis present

## 2021-08-23 DIAGNOSIS — E669 Obesity, unspecified: Secondary | ICD-10-CM | POA: Diagnosis present

## 2021-08-23 DIAGNOSIS — N179 Acute kidney failure, unspecified: Secondary | ICD-10-CM | POA: Diagnosis present

## 2021-08-23 DIAGNOSIS — N184 Chronic kidney disease, stage 4 (severe): Secondary | ICD-10-CM | POA: Diagnosis present

## 2021-08-23 DIAGNOSIS — E785 Hyperlipidemia, unspecified: Secondary | ICD-10-CM | POA: Diagnosis present

## 2021-08-23 DIAGNOSIS — F32A Depression, unspecified: Secondary | ICD-10-CM | POA: Diagnosis present

## 2021-08-23 DIAGNOSIS — E1140 Type 2 diabetes mellitus with diabetic neuropathy, unspecified: Secondary | ICD-10-CM | POA: Diagnosis present

## 2021-08-23 DIAGNOSIS — E872 Acidosis, unspecified: Secondary | ICD-10-CM | POA: Diagnosis present

## 2021-08-23 LAB — CBC
HCT: 36.7 % — ABNORMAL LOW (ref 39.0–52.0)
Hemoglobin: 11.2 g/dL — ABNORMAL LOW (ref 13.0–17.0)
MCH: 29.2 pg (ref 26.0–34.0)
MCHC: 30.5 g/dL (ref 30.0–36.0)
MCV: 95.8 fL (ref 80.0–100.0)
Platelets: 247 10*3/uL (ref 150–400)
RBC: 3.83 MIL/uL — ABNORMAL LOW (ref 4.22–5.81)
RDW: 13.8 % (ref 11.5–15.5)
WBC: 7.6 10*3/uL (ref 4.0–10.5)
nRBC: 0 % (ref 0.0–0.2)

## 2021-08-23 LAB — BASIC METABOLIC PANEL
Anion gap: 7 (ref 5–15)
BUN: 40 mg/dL — ABNORMAL HIGH (ref 6–20)
CO2: 18 mmol/L — ABNORMAL LOW (ref 22–32)
Calcium: 9 mg/dL (ref 8.9–10.3)
Chloride: 113 mmol/L — ABNORMAL HIGH (ref 98–111)
Creatinine, Ser: 2.98 mg/dL — ABNORMAL HIGH (ref 0.61–1.24)
GFR, Estimated: 24 mL/min — ABNORMAL LOW (ref >60)
Glucose, Bld: 128 mg/dL — ABNORMAL HIGH (ref 70–99)
Potassium: 4.6 mmol/L (ref 3.5–5.1)
Sodium: 138 mmol/L (ref 135–145)

## 2021-08-23 LAB — GLUCOSE, CAPILLARY
Glucose-Capillary: 124 mg/dL — ABNORMAL HIGH (ref 70–99)
Glucose-Capillary: 112 mg/dL — ABNORMAL HIGH (ref 70–99)
Glucose-Capillary: 139 mg/dL — ABNORMAL HIGH (ref 70–99)
Glucose-Capillary: 111 mg/dL — ABNORMAL HIGH (ref 70–99)

## 2021-08-23 LAB — MICROALBUMIN / CREATININE URINE RATIO
Creatinine, Urine: 35.5 mg/dL
Microalb Creat Ratio: 8237 mg/g creat — ABNORMAL HIGH (ref 0–29)
Microalb, Ur: 2924.1 ug/mL — ABNORMAL HIGH

## 2021-08-23 MED ORDER — SODIUM CHLORIDE 0.9 % IV SOLN: INTRAVENOUS | Status: DC

## 2021-08-23 NOTE — Progress Notes (Signed)
PROGRESS NOTE    Keith [name]  HRC:[id number] DOB: February 07, 1966 DOA: 08/21/2021 [name], NP   Brief Narrative:  HPI per Dr. Wynetta Fines on 08/22/21 Keith [name] is a 56 y.o. male with medical history significant of HIV on HAART, HTN, IIDM, HLD, CKD stage II, gout, obesity, status post bilateral BKA, anxiety/depression, chronic narcotic use, presented with worsening of headaches.   Patient started to have right-sided headache on and off, for about 1 month while he was residing in a rehab center last month.  He was given as needed medication for headache with some success.  Last Friday, he was discharged home, however he has not been able to refill his blood pressure medications for last 3 days.  Increasingly, he has had worsening of headaches, localized on the right frontal area, sometimes associated with fear of light, denies any blurry vision, no double vision, nauseous vomiting, no numbness or weakness of any of the limbs.  He denies any fever chills, no neck pain no muscle ache or joint pains.   I 1 time, he has BPH, but he denied taking any of the BPH medications.  Denies any urinary problems including dysuria, urinary frequency, no back pain.  Denies any decrease of urine output or urine color changes.   **Interim History Headache is improving and Renal Fxn is trending down. Will continue IVF for another day and reassess. PT/OT to evaluate and Treat.    Assessment and Plan:  AKI vs worsening of CKD stage II baseline Metabolic Acidosis  -No significant urinary symptoms or back pains.  Might have a new bacteriuria, will be discussed below. -Clinically looks a little dry, etiology of AKI probably related to uncontrolled hypertension/hypertensive nephropathy given there is also a concurrent persistent proteinuria, his diabetes is fairly controlled with recent A1c 6.0 without any diabetic medications at this point. -Clinically, patient does not have any neck pain, no fever and no  white count and no mentation changes.  And patient's CD4=129 in May, thus unlikely CNS infections, will   all antibiotics. -Patient's BUN/Cr is improving and went from 43/3.60 -> 40/2.98 -Check renal ultrasound, FeNa (Urine Sodium was 41 and Urine Cr was 165), uric acid and CK level -Patient's Microalbumin/Cr Ratio was 8,237 -Patient has a small metabolic Acidosis with a CO2 of 18, AG, of 7, and Chloride Level of 113 -Renal U/S done and showed "Left kidney is not visualized increased echogenicity right renal parenchyma is noted suggesting medical renal disease. No hydronephrosis or renal obstruction is noted." -Trial of hydration x24 hours and will repeat but with NS at 100 mL/hr now, if kidney function continues to follow up, likely can be discharged home and follow-up with Renal as outpatient. -Holding nephrotoxic medications including lisinopril his medications for BPH -Continue to Monitor and Trend and repeat CMP in the AM and avoid further nephrotoxic medications, contrast dyes, hypotension and dehydration to ensure adequate renal perfusion and renally dose medications   Headache, improving -Ddx including uncontrolled hypertension, tension headaches, polypharmacy/narcotic related headache. -Check ESR, clinically less suspicious of GCA. ESR was 58 -Trial of Fioricet -Discussed about cautious usage of narcotics, and patient agreed. -PT/OT to evaluate and treat   Bacteriuria -Given there is a kidney function fluctuations, Admitting decided to treat 1 course of antibiotics. -Check PVR and consider restart Flomax   HTN, uncontrolled -Secondary to known coherent with HTN medications, restart Coreg, add as needed hydralazine. -Continue to Monitor BP per Protocol -Last BP reading was improved and was  133/73   Proteinuria -Checked microalbumin/creatinine ratio and was 8,237 -Outpatient nephrology follow-up   HIV -Patient claims that he has been compliant with HAART, most recent CD4 count  129, viral copy<20.   -Continue Dovato and prophylactic dapsone.  Normocytic Anemia -Likely anemia of chronic disease and likely of chronic kidney disease from uncontrolled hypertension -Patient's hemoglobin/hematocrit is now gone from 11.6/36.7 and is now 11.2/36.7 -Check anemia panel in a.m. -Continue to monitor for signs and symptoms of bleeding; no overt bleeding noted   IIDM -Controlled, A1c= 6.0. -CBG's ranging from 112-286   Polypharmacy -Discussed about narcotic use.   Chronic pain syndrome -Gabapentin is renally dosed.   Obesity -Complicates overall prognosis and care -Estimated body mass index is 39.13 kg/m as calculated from the following:   Height as of this encounter: $RemoveBeforeD'5\' 9"'[id number]$  (1.753 m).   Weight as of this encounter: 120.2 kg.  -Weight Loss and Dietary Counseling given   DVT prophylaxis: heparin injection 5,000 Units Start: 08/22/21 1400    Code Status: Full Code Family Communication: No family present at bedside   Disposition Plan:  Level of care: Med-Surg Status is: Observation The patient will require care spanning > 2 midnights and should be moved to inpatient because: Renal Fxn is still elevated   Consultants:  None  Procedures:  None  Antimicrobials:  Anti-infectives (From admission, onward)    Start     Dose/Rate Route Frequency Ordered Stop   08/24/21 1200  vancomycin (VANCOREADY) IVPB 1500 mg/300 mL  Status:  Discontinued        1,500 mg 150 mL/hr over 120 Minutes Intravenous Every 48 hours 08/22/21 1132 08/22/21 1252   08/23/21 1000  cefTRIAXone (ROCEPHIN) 1 g in sodium chloride 0.9 % 100 mL IVPB        1 g 200 mL/hr over 30 Minutes Intravenous Every 24 hours 08/22/21 1329     08/22/21 1900  dapsone tablet 100 mg        100 mg Oral Daily 08/22/21 1328     08/22/21 1900  dolutegravir-lamiVUDine (DOVATO) 50-300 MG per tablet 1 tablet        1 tablet Oral Daily 08/22/21 1328     08/22/21 1900  doravirine (PIFELTRO) tablet 100 mg        100  mg Oral Daily 08/22/21 1328     08/22/21 1130  vancomycin (VANCOCIN) 2,500 mg in sodium chloride 0.9 % 500 mL IVPB  Status:  Discontinued        2,500 mg 262.5 mL/hr over 120 Minutes Intravenous  Once 08/22/21 1123 08/22/21 1335   08/22/21 1100  cefTRIAXone (ROCEPHIN) 2 g in sodium chloride 0.9 % 100 mL IVPB        2 g 200 mL/hr over 30 Minutes Intravenous  Once 08/22/21 1048 08/22/21 1133   08/22/21 0900  cephALEXin (KEFLEX) capsule 500 mg        500 mg Oral  Once 08/22/21 0851 08/22/21 0923   08/22/21 0000  cephALEXin (KEFLEX) 500 MG capsule        500 mg Oral 2 times daily 08/22/21 4656 08/28/21 2359        Subjective: Seen and examined at bedside and is very sleepy but awakes and states that his headache is a little bit better.  Denies any other complaints at this time and wants to rest and denies any chest pain or shortness of breath.  Objective: Vitals:   08/23/21 0423 08/23/21 0630 08/23/21 0736 08/23/21 1525  BP: (!) 171/101 Marland Kitchen)  160/85 (!) 152/88 133/73  Pulse: 80 82 77 72  Resp: 18 20 16 16   Temp: 98.3 F (36.8 C)  98.4 F (36.9 C) 98.5 F (36.9 C)  TempSrc:   Oral Oral  SpO2: 98%  94% 97%  Weight:      Height:        Intake/Output Summary (Last 24 hours) at 08/23/2021 1747 Last data filed at 08/23/2021 2330 Gross per 24 hour  Intake --  Output 1900 ml  Net -1900 ml   Filed Weights   08/22/21 0024  Weight: 120.2 kg   Examination: Physical Exam:  Constitutional: WN/WD obese African-American male currently no acute distress appears calm and sleepy Respiratory: Slightly diminished to auscultation bilaterally, no wheezing, rales, rhonchi or crackles. Normal respiratory effort and patient is not tachypenic. No accessory muscle use.  Unlabored breathing Cardiovascular: RRR, no murmurs / rubs / gallops. S1 and S2 auscultated.  Abdomen: Soft, non-tender, distended secondary body bowel sounds positive.  GU: Deferred. Musculoskeletal: No clubbing / cyanosis of  digits/nails.  Bilateral BKA's Neurologic: Patient is somnolent and drowsy but has cranial nerves II through XII appear grossly intact.  Data Reviewed: I have personally reviewed following labs and imaging studies  CBC: Recent Labs  Lab 08/17/21 1215 08/22/21 0031 08/23/21 0629  WBC 6.1 7.0 7.6  NEUTROABS 3,794 4.9  --   HGB 10.6* 11.6* 11.2*  HCT 32.7* 36.7* 36.7*  MCV 93.4 97.6 95.8  PLT 216 264 076   Basic Metabolic Panel: Recent Labs  Lab 08/17/21 1215 08/22/21 0031 08/23/21 0629  NA 137 139 138  K 5.2 4.4 4.6  CL 108 113* 113*  CO2 23 20* 18*  GLUCOSE 149* 142* 128*  BUN 38* 43* 40*  CREATININE 3.51* 3.60* 2.98*  CALCIUM 8.8 9.1 9.0   GFR: Estimated Creatinine Clearance: 35.4 mL/min (A) (by C-G formula based on SCr of 2.98 mg/dL (H)). Liver Function Tests: Recent Labs  Lab 08/17/21 1215 08/22/21 0031  AST 16 21  ALT 12 19  ALKPHOS  --  79  BILITOT 0.5 0.5  PROT 7.0 7.7  ALBUMIN  --  3.4*   No results for input(s): LIPASE, AMYLASE in the last 168 hours. No results for input(s): AMMONIA in the last 168 hours. Coagulation Profile: No results for input(s): INR, PROTIME in the last 168 hours. Cardiac Enzymes: Recent Labs  Lab 08/22/21 1341  CKTOTAL 681*   BNP (last 3 results) No results for input(s): PROBNP in the last 8760 hours. HbA1C: No results for input(s): HGBA1C in the last 72 hours. CBG: Recent Labs  Lab 08/22/21 1631 08/22/21 1931 08/23/21 0734 08/23/21 1125 08/23/21 1616  GLUCAP 286* 207* 124* 112* 139*   Lipid Profile: No results for input(s): CHOL, HDL, LDLCALC, TRIG, CHOLHDL, LDLDIRECT in the last 72 hours. Thyroid Function Tests: No results for input(s): TSH, T4TOTAL, FREET4, T3FREE, THYROIDAB in the last 72 hours. Anemia Panel: No results for input(s): VITAMINB12, FOLATE, FERRITIN, TIBC, IRON, RETICCTPCT in the last 72 hours. Sepsis Labs: No results for input(s): PROCALCITON, LATICACIDVEN in the last 168 hours.  Recent  Results (from the past 240 hour(s))  MRSA Next Gen by PCR, Nasal     Status: Abnormal   Collection Time: 08/22/21  4:32 PM   Specimen: Nasal Mucosa; Nasal Swab  Result Value Ref Range Status   MRSA by PCR Next Gen DETECTED (A) NOT DETECTED Final    Comment: RESULT CALLED TO, READ BACK BY AND VERIFIED WITH: RN YENNY RIMANDO5/17/23@21 :14 BY  TW (NOTE) The GeneXpert MRSA Assay (FDA approved for NASAL specimens only), is one component of a comprehensive MRSA colonization surveillance program. It is not intended to diagnose MRSA infection nor to guide or monitor treatment for MRSA infections. Test performance is not FDA approved in patients less than 60 years old. Performed at [facility], [place] [address], [place], [place] [address]     Radiology Studies: CT Head Wo Contrast  Result Date: 08/22/2021 CLINICAL DATA:  Headache EXAM: CT HEAD WITHOUT CONTRAST TECHNIQUE: Contiguous axial images were obtained from the base of the skull through the vertex without intravenous contrast. RADIATION DOSE REDUCTION: This exam was performed according to the departmental dose-optimization program which includes automated exposure control, adjustment of the mA and/or kV according to patient size and/or use of iterative reconstruction technique. COMPARISON:  04/27/2020 FINDINGS: Brain: There is no mass, hemorrhage or extra-axial collection. The size and configuration of the ventricles and extra-axial CSF spaces are normal. The brain parenchyma is normal, without acute or chronic infarction. Vascular: No abnormal hyperdensity of the major intracranial arteries or dural venous sinuses. No intracranial atherosclerosis. Skull: The visualized skull base, calvarium and extracranial soft tissues are normal. Sinuses/Orbits: No fluid levels or advanced mucosal thickening of the visualized paranasal sinuses. No mastoid or middle ear effusion. The orbits are normal. IMPRESSION: Normal head CT. Electronically Signed   By:  Ulyses Jarred M.D.   On: 08/22/2021 01:03   US RENAL  Result Date: 08/22/2021 CLINICAL DATA:  Acute kidney injury. EXAM: RENAL / URINARY TRACT ULTRASOUND COMPLETE COMPARISON:  December 18, 2020. FINDINGS: Right Kidney: Renal measurements: 11.0 x 4.1 x 3.8 cm = volume: 91 mL. Increased echogenicity of renal parenchyma is noted suggesting medical renal disease. No mass or hydronephrosis visualized. Left Kidney: Not visualized. Technologist did not report reason for nonvisualization. Bladder: Appears normal for degree of bladder distention. Other: None. IMPRESSION: Left kidney is not visualized increased echogenicity right renal parenchyma is noted suggesting medical renal disease. No hydronephrosis or renal obstruction is noted. Electronically Signed   By: Marijo Conception M.D.   On: 08/22/2021 15:37     Scheduled Meds:  allopurinol  100 mg Oral Daily   aspirin EC  81 mg Oral Daily   atorvastatin  80 mg Oral Daily   carvedilol  3.125 mg Oral BID   Chlorhexidine Gluconate Cloth  6 each Topical Q0600   dapsone  100 mg Oral Daily   dolutegravir-lamiVUDine  1 tablet Oral Daily   doravirine  100 mg Oral Daily   [START ON 08/24/2021] ferrous sulfate  325 mg Oral Q M,W,F-1800   gabapentin  100 mg Oral TID   heparin  5,000 Units Subcutaneous Q8H   icosapent Ethyl  2 g Oral BID   insulin aspart  0-9 Units Subcutaneous TID WC   mupirocin ointment  1 application. Nasal BID   oxybutynin  2.5 mg Oral BID   pantoprazole  40 mg Oral Daily   traZODone  100 mg Oral QHS   Continuous Infusions:  cefTRIAXone (ROCEPHIN)  IV 1 g (08/23/21 0829)    LOS: 0 days   Raiford Noble, DO [facility] Available via Epic secure chat 7am-7pm After these hours, please refer to coverage provider listed on [email or web address] 08/23/2021, 5:47 PM

## 2021-08-23 NOTE — Plan of Care (Signed)

## 2021-08-23 NOTE — Plan of Care (Signed)

## 2021-08-24 ENCOUNTER — Ambulatory Visit: Payer: Medicare Other | Admitting: Physical Therapy

## 2021-08-24 LAB — RETICULOCYTES
Immature Retic Fract: 17.4 % — ABNORMAL HIGH (ref 2.3–15.9)
RBC.: 3.49 MIL/uL — ABNORMAL LOW (ref 4.22–5.81)
Retic Count, Absolute: 75 10*3/uL (ref 19.0–186.0)
Retic Ct Pct: 2.2 % (ref 0.4–3.1)

## 2021-08-24 LAB — CBC WITH DIFFERENTIAL/PLATELET
Abs Immature Granulocytes: 0.02 10*3/uL (ref 0.00–0.07)
Basophils Absolute: 0.1 10*3/uL (ref 0.0–0.1)
Basophils Relative: 1 %
Eosinophils Absolute: 0.2 10*3/uL (ref 0.0–0.5)
Eosinophils Relative: 2 %
HCT: 34.1 % — ABNORMAL LOW (ref 39.0–52.0)
Hemoglobin: 10.5 g/dL — ABNORMAL LOW (ref 13.0–17.0)
Immature Granulocytes: 0 %
Lymphocytes Relative: 17 %
Lymphs Abs: 1.4 10*3/uL (ref 0.7–4.0)
MCH: 29.7 pg (ref 26.0–34.0)
MCHC: 30.8 g/dL (ref 30.0–36.0)
MCV: 96.6 fL (ref 80.0–100.0)
Monocytes Absolute: 0.6 10*3/uL (ref 0.1–1.0)
Monocytes Relative: 7 %
Neutro Abs: 6 10*3/uL (ref 1.7–7.7)
Neutrophils Relative %: 73 %
Platelets: 237 10*3/uL (ref 150–400)
RBC: 3.53 MIL/uL — ABNORMAL LOW (ref 4.22–5.81)
RDW: 13.8 % (ref 11.5–15.5)
WBC: 8.2 10*3/uL (ref 4.0–10.5)
nRBC: 0 % (ref 0.0–0.2)

## 2021-08-24 LAB — IRON AND TIBC
Iron: 77 ug/dL (ref 45–182)
Saturation Ratios: 34 % (ref 17.9–39.5)
TIBC: 225 ug/dL — ABNORMAL LOW (ref 250–450)
UIBC: 148 ug/dL

## 2021-08-24 LAB — COMPREHENSIVE METABOLIC PANEL
ALT: 12 U/L (ref 0–44)
AST: 13 U/L — ABNORMAL LOW (ref 15–41)
Albumin: 2.8 g/dL — ABNORMAL LOW (ref 3.5–5.0)
Alkaline Phosphatase: 69 U/L (ref 38–126)
Anion gap: 6 (ref 5–15)
BUN: 40 mg/dL — ABNORMAL HIGH (ref 6–20)
CO2: 19 mmol/L — ABNORMAL LOW (ref 22–32)
Calcium: 8.4 mg/dL — ABNORMAL LOW (ref 8.9–10.3)
Chloride: 113 mmol/L — ABNORMAL HIGH (ref 98–111)
Creatinine, Ser: 3.38 mg/dL — ABNORMAL HIGH (ref 0.61–1.24)
GFR, Estimated: 20 mL/min — ABNORMAL LOW (ref >60)
Glucose, Bld: 135 mg/dL — ABNORMAL HIGH (ref 70–99)
Potassium: 4 mmol/L (ref 3.5–5.1)
Sodium: 138 mmol/L (ref 135–145)
Total Bilirubin: 0.6 mg/dL (ref 0.3–1.2)
Total Protein: 6.6 g/dL (ref 6.5–8.1)

## 2021-08-24 LAB — FOLATE: Folate: 8.1 ng/mL (ref >5.9)

## 2021-08-24 LAB — VITAMIN B12: Vitamin B-12: 488 pg/mL (ref 180–914)

## 2021-08-24 LAB — GLUCOSE, CAPILLARY
Glucose-Capillary: 133 mg/dL — ABNORMAL HIGH (ref 70–99)
Glucose-Capillary: 129 mg/dL — ABNORMAL HIGH (ref 70–99)
Glucose-Capillary: 179 mg/dL — ABNORMAL HIGH (ref 70–99)

## 2021-08-24 LAB — PHOSPHORUS: Phosphorus: 3.8 mg/dL (ref 2.5–4.6)

## 2021-08-24 LAB — MAGNESIUM: Magnesium: 1.7 mg/dL (ref 1.7–2.4)

## 2021-08-24 LAB — FERRITIN: Ferritin: 283 ng/mL (ref 24–336)

## 2021-08-24 MED ORDER — SODIUM BICARBONATE 650 MG PO TABS: 650.0000 mg | ORAL_TABLET | Freq: Two times a day (BID) | ORAL | 0 refills | Status: DC

## 2021-08-24 MED ORDER — SODIUM BICARBONATE 650 MG PO TABS: 650.0000 mg | ORAL_TABLET | Freq: Two times a day (BID) | ORAL | Status: DC

## 2021-08-24 NOTE — Plan of Care (Deleted)
Patient's BP has been running high overnight. Denies any headache, no vision changes or weakness but c/o left neck to shoulder pain. Stated that she's been laying down too long in uncomfortable position. Pt also expressed being anxious about her discharge. On call MD notified and ordered hydralazine 10 mg IV every 4 hours as needed. PRN pain meds, anxiety and BP med have all given with a little to no effect.   Problem: Education: Goal: Knowledge of General Education information will improve Description: Including pain rating scale, medication(s)/side effects and non-pharmacologic comfort measures Outcome: Progressing   Problem: Health Behavior/Discharge Planning: Goal: Ability to manage health-related needs will improve Outcome: Progressing   Problem: Clinical Measurements: Goal: Ability to maintain clinical measurements within normal limits will improve Outcome: Progressing   Problem: Nutrition: Goal: Adequate nutrition will be maintained Outcome: Progressing   Problem: Pain Managment: Goal: General experience of comfort will improve Outcome: Progressing   Problem: Safety: Goal: Ability to remain free from injury will improve Outcome: Progressing   Problem: Skin Integrity: Goal: Risk for impaired skin integrity will decrease Outcome: Progressing

## 2021-08-24 NOTE — Discharge Summary (Signed)
Physician Discharge Summary   Patient: Keith [name] MRN: [id number] DOB: [birth year]  Admit date:     08/21/2021  Discharge date: 08/24/21  Discharge Physician: Raiford Noble, DO   [name], NP   Recommendations at discharge:   Follow up with PCP within 1-2 weeks and repeat CBC,CMP, Mag, Phos within 1 week Follow up with ID Dr. [name] within 1 week  Follow up with Nephrology Dr. Candiss Norse within 1 week    Discharge Diagnoses: Principal Problem:   AKI (acute kidney injury) ([name]) Active Problems:   HTN (hypertension)   HIV disease ([name])   Hyperglycemia due to diabetes mellitus ([name])   Headache  Resolved Problems:   * No resolved hospital problems. [facility] Course: HPI per Dr. Wynetta Fines on 08/22/21 Keith [name] is a 56 y.o. male with medical history significant of HIV on HAART, HTN, IIDM, HLD, CKD stage II, gout, obesity, status post bilateral BKA, anxiety/depression, chronic narcotic use, presented with worsening of headaches.   Patient started to have right-sided headache on and off, for about 1 month while he was residing in a rehab center last month.  He was given as needed medication for headache with some success.  Last Friday, he was discharged home, however he has not been able to refill his blood pressure medications for last 3 days.  Increasingly, he has had worsening of headaches, localized on the right frontal area, sometimes associated with fear of light, denies any blurry vision, no double vision, nauseous vomiting, no numbness or weakness of any of the limbs.  He denies any fever chills, no neck pain no muscle ache or joint pains.   I 1 time, he has BPH, but he denied taking any of the BPH medications.  Denies any urinary problems including dysuria, urinary frequency, no back pain.  Denies any decrease of urine output or urine color changes.   **Interim History Headache is improving and Renal Fxn was trending down but fluctuated and went up. Case was  discussed with Nephrology who reviewed the case and felt the patient could be discharged with close outpatient follow up. PT recommended outpatient PT    Assessment and Plan:  AKI on CKD stage IIIb baseline; Progressed Renal Disease  Metabolic Acidosis  -No significant urinary symptoms or back pains.  Might have a new bacteriuria, will be discussed below. -Clinically looks a little dry, etiology of AKI probably related to uncontrolled hypertension/hypertensive nephropathy given there is also a concurrent persistent proteinuria, his diabetes is fairly controlled with recent A1c 6.0 without any diabetic medications at this point. -Clinically, patient does not have any neck pain, no fever and no white count and no mentation changes.  And patient's CD4=129 in May, thus unlikely CNS infections, will   all antibiotics. -Patient's BUN/Cr is improving and went from 43/3.60 -> 40/2.98 - -Check renal ultrasound, FeNa (Urine Sodium was 41 and Urine Cr was 165), uric acid and CK level -Patient's Microalbumin/Cr Ratio was 8,237 -Patient has a small metabolic Acidosis with a CO2 of 18, AG, of 7, and Chloride Level of 113 -Renal U/S done and showed "Left kidney is not visualized increased echogenicity right renal parenchyma is noted suggesting medical renal disease. No hydronephrosis or renal obstruction is noted." -Patient states he has both kidneys -He was given IV fluid hydration I spoke with nephrology who felt the patient be safely discharged home and felt that his baseline is progressive and likely fluctuating with his creatinine.  Patient sees Dr.  Singh and Dr. Carolin Sicks is going to arrange outpatient follow-up with him -Nephrology recommending starting bicarbonate 650 mg p.o. twice daily and continue at discharge -Holding nephrotoxic medications including lisinopril his medications for BPH -Continue to Monitor and Trend and repeat CMP within 1 week and avoid further nephrotoxic medications, contrast dyes,  hypotension and dehydration to ensure adequate renal perfusion and renally dose medications and per nephrology recommendations hold lisinopril at discharge   Headache, improved -Ddx including uncontrolled hypertension, tension headaches, polypharmacy/narcotic related headache. -Check ESR, clinically less suspicious of GCA. ESR was 58 -Trial of Fioricet -Discussed about cautious usage of narcotics, and patient agreed. -PT/OT to evaluate and treat recommending outpatient PT   Bacteriuria -Given there is a kidney function fluctuations, Admitting decided to treat 1 course of antibiotics. -Check PVR and consider restart Flomax -Was getting treated with with Keflex in the outpatient setting in which we will continue; received ceftriaxone while he is here   HTN, uncontrolled -Secondary to known coherent with HTN medications, restart Coreg, add as needed hydralazine. -Continue to Monitor BP per Protocol -Last BP reading was improved and was 138/90   Proteinuria -Checked microalbumin/creatinine ratio and was 8,237 -Outpatient nephrology follow-up with Dr. Candiss Norse   HIV -Patient claims that he has been compliant with HAART, most recent CD4 count 129, viral copy<20.   -Continue Dovato and prophylactic dapsone. -Follow-up with Dr. [name] in the outpatient setting   Normocytic Anemia -Likely anemia of chronic disease and likely of chronic kidney disease from uncontrolled hypertension -Patient's hemoglobin/hematocrit is now gone from 11.6/36.7 and is now 11.2/36.7 yesterday and is now 10.5/34.1 -Check anemia panel in a.m and was showing an iron level of 77, U IBC 148, TIBC 225, saturation ratios of 34%, ferritin level 283, folate level 8.1 and vitamin B12 level of 488 -Continue to monitor for signs and symptoms of bleeding; no overt bleeding noted   IIDM -Controlled, A1c= 6.0. -CBG's ranging from 111-179   Polypharmacy -Discussed about narcotic use.   Chronic pain syndrome -Gabapentin is  renally dosed.   Obesity -Complicates overall prognosis and care -Estimated body mass index is 39.13 kg/m as calculated from the following:   Height as of this encounter: $RemoveBeforeD'5\' 9"'[id number]$  (1.753 m).   Weight as of this encounter: 120.2 kg.  -Weight Loss and Dietary Counseling given  Consultants: Discussed with nephrology Procedures performed: Renal ultrasound Disposition:  Home with outpatient physical therapy Diet recommendation:  Discharge Diet Orders (From admission, onward)     Start     Ordered   08/24/21 0000  Diet Carb Modified        08/24/21 1532   08/24/21 0000  Diet - low sodium heart healthy       Comments: With 1200 mL Fluid Restriction   08/24/21 1532           Renal diet DISCHARGE MEDICATION: Allergies as of 08/24/2021       Reactions   Elemental Sulfur Itching   Patient stated he's allergic to "sulfur" AND "sulfa"   Sulfa Antibiotics Itching        Medication List     STOP taking these medications    lisinopril 10 MG tablet Commonly known as: ZESTRIL   metFORMIN 1000 MG tablet Commonly known as: GLUCOPHAGE       TAKE these medications    acetaminophen 325 MG tablet Commonly known as: TYLENOL Take 2 tablets (650 mg total) by mouth every 4 (four) hours as needed for mild pain, fever or headache. What changed:  when to take this reasons to take this   allopurinol 100 MG tablet Commonly known as: ZYLOPRIM Take 1 tablet (100 mg total) by mouth daily.   ALPRAZolam 1 MG tablet Commonly known as: XANAX Take 1 tablet (1 mg total) by mouth daily as needed for anxiety.   aspirin EC 81 MG tablet Take 1 tablet (81 mg total) by mouth daily.   atorvastatin 80 MG tablet Commonly known as: LIPITOR Take 1 tablet (80 mg total) by mouth daily.   blood glucose meter kit and supplies Dispense based on patient and insurance preference. Use up to four times daily as directed. (FOR ICD-10 E10.9, E11.9).   carvedilol 3.125 MG tablet Commonly known as:  Coreg Take 1 tablet (3.125 mg total) by mouth 2 (two) times daily.   cephALEXin 500 MG capsule Commonly known as: KEFLEX Take 1 capsule (500 mg total) by mouth 2 (two) times daily for 6 days.   Cool Blood Glucose Test Strips test strip Generic drug: glucose blood Use as instructed   dapsone 100 MG tablet TAKE 1 TABLET (100 MG TOTAL) BY MOUTH DAILY.   doravirine 100 MG Tabs tablet Commonly known as: PIFELTRO Take 1 tablet (100 mg total) by mouth daily.   Dovato 50-300 MG tablet Generic drug: dolutegravir-lamiVUDine Take 1 tablet by mouth daily.   escitalopram 20 MG tablet Commonly known as: LEXAPRO Take 1 tablet (20 mg total) by mouth daily as needed. What changed: when to take this   FeroSul 325 (65 FE) MG tablet Generic drug: ferrous sulfate Take 1 tablet (325 mg total) by mouth every Monday, Wednesday, and Friday at 6 PM. Do not take with other medicines. What changed:  when to take this additional instructions Another medication with the same name was removed. Continue taking this medication, and follow the directions you see here.   finasteride 5 MG tablet Commonly known as: PROSCAR Take 5 mg by mouth daily.   gabapentin 100 MG capsule Commonly known as: NEURONTIN Take 1 capsule (100 mg total) by mouth 3 (three) times daily. What changed: Another medication with the same name was removed. Continue taking this medication, and follow the directions you see here.   insulin aspart 100 UNIT/ML FlexPen Commonly known as: NOVOLOG Inject 10 Units into the skin 3 (three) times daily with meals.   insulin glargine 100 UNIT/ML Solostar Pen Commonly known as: LANTUS Inject 10 Units into the skin daily.   nitroGLYCERIN 0.4 MG SL tablet Commonly known as: NITROSTAT Place 0.4 mg under the tongue every 5 (five) minutes as needed for chest pain.   onetouch ultrasoft lancets Use as instructed   oxybutynin 5 MG tablet Commonly known as: DITROPAN Take 1/2 tablet (2.5 mg  total) by mouth 2 (two) times daily.   oxyCODONE-acetaminophen 10-325 MG tablet Commonly known as: PERCOCET Take 1 tablet by mouth every 4 (four) hours as needed for pain. What changed: reasons to take this   pantoprazole 40 MG tablet Commonly known as: PROTONIX Take 1 tablet (40 mg total) by mouth daily.   sodium bicarbonate 650 MG tablet Take 1 tablet (650 mg total) by mouth 2 (two) times daily.   tamsulosin 0.4 MG Caps capsule Commonly known as: FLOMAX Take 0.4 mg by mouth daily.   traZODone 150 MG tablet Commonly known as: DESYREL Take 75 mg by mouth at bedtime.   Vascepa 1 g capsule Generic drug: icosapent Ethyl TAKE 2 CAPSULES (2 G TOTAL) BY MOUTH TWO TIMES DAILY. What changed:  how much  to take how to take this when to take this   Vitamin D3 1.25 MG (50000 UT) Caps Take 50,000 Units by mouth once a week. Thursday               Durable Medical Equipment  (From admission, onward)           Start     Ordered   08/24/21 1532  DME Walker  Once       Question Answer Comment  Walker: With 5 Inch Wheels   Patient needs a walker to treat with the following condition S/P bilateral BKA (below knee amputation) ([place])      08/24/21 1532            Follow-up Information     [facility] Follow up.   Specialty: Rehabilitation Why: Outpatient PT  referral made . Please call and setup appointment time. Contact information: [address] [place] [id number] [place] [address] [phone]        [name], NP Follow up.   Specialty: Adult Health Nurse Practitioner Contact information: [address] Renee Harder [place] [address] [phone]         Jettie Booze, MD .   Specialties: Cardiology, Radiology, Interventional Cardiology Contact information: [address] [address] [phone]                Discharge Exam: Danley Danker Weights    08/22/21 0024  Weight: 120.2 kg   Vitals:   08/24/21 0412 08/24/21 1308  BP: (!) 141/82 138/90  Pulse: 68 66  Resp: 19 18  Temp: 98.6 F (37 C) (!) 97.5 F (36.4 C)  SpO2: 98% 95%   Examination: Physical Exam:  Constitutional: WN/WD obese African-American male currently no acute distress appears calm and comfortable and improved Respiratory: Diminished to auscultation bilaterally, no wheezing, rales, rhonchi or crackles. Normal respiratory effort and patient is not tachypenic. No accessory muscle use.  Unlabored breathing Cardiovascular: RRR, no murmurs / rubs / gallops. S1 and S2 auscultated. Abdomen: Soft, non-tender, distended second body. Bowel sounds positive.  GU: Deferred. Musculoskeletal: No clubbing / cyanosis of digits/nails.  Bilateral BKA's Neurologic: CN 2-12 grossly intact with no focal deficits. Psychiatric: Normal judgment and insight. Alert and oriented x 3. Normal mood and appropriate affect.   Condition at discharge: stable  The results of significant diagnostics from this hospitalization (including imaging, microbiology, ancillary and laboratory) are listed below for reference.   Imaging Studies: CT Head Wo Contrast  Result Date: 08/22/2021 CLINICAL DATA:  Headache EXAM: CT HEAD WITHOUT CONTRAST TECHNIQUE: Contiguous axial images were obtained from the base of the skull through the vertex without intravenous contrast. RADIATION DOSE REDUCTION: This exam was performed according to the departmental dose-optimization program which includes automated exposure control, adjustment of the mA and/or kV according to patient size and/or use of iterative reconstruction technique. COMPARISON:  04/27/2020 FINDINGS: Brain: There is no mass, hemorrhage or extra-axial collection. The size and configuration of the ventricles and extra-axial CSF spaces are normal. The brain parenchyma is normal, without acute or chronic infarction. Vascular: No abnormal hyperdensity of the major  intracranial arteries or dural venous sinuses. No intracranial atherosclerosis. Skull: The visualized skull base, calvarium and extracranial soft tissues are normal. Sinuses/Orbits: No fluid levels or advanced mucosal thickening of the visualized paranasal sinuses. No mastoid or middle ear effusion. The orbits are normal. IMPRESSION: Normal head CT. Electronically Signed   By: Ulyses Jarred  M.D.   On: 08/22/2021 01:03   US RENAL  Result Date: 08/22/2021 CLINICAL DATA:  Acute kidney injury. EXAM: RENAL / URINARY TRACT ULTRASOUND COMPLETE COMPARISON:  December 18, 2020. FINDINGS: Right Kidney: Renal measurements: 11.0 x 4.1 x 3.8 cm = volume: 91 mL. Increased echogenicity of renal parenchyma is noted suggesting medical renal disease. No mass or hydronephrosis visualized. Left Kidney: Not visualized. Technologist did not report reason for nonvisualization. Bladder: Appears normal for degree of bladder distention. Other: None. IMPRESSION: Left kidney is not visualized increased echogenicity right renal parenchyma is noted suggesting medical renal disease. No hydronephrosis or renal obstruction is noted. Electronically Signed   By: Marijo Conception M.D.   On: 08/22/2021 15:37    Microbiology: Results for orders placed or performed during the hospital encounter of 08/21/21  MRSA Next Gen by PCR, Nasal     Status: Abnormal   Collection Time: 08/22/21  4:32 PM   Specimen: Nasal Mucosa; Nasal Swab  Result Value Ref Range Status   MRSA by PCR Next Gen DETECTED (A) NOT DETECTED Final    Comment: RESULT CALLED TO, READ BACK BY AND VERIFIED WITH: RN YENNY RIMANDO5/17/23@21 :14 BY TW (NOTE) The GeneXpert MRSA Assay (FDA approved for NASAL specimens only), is one component of a comprehensive MRSA colonization surveillance program. It is not intended to diagnose MRSA infection nor to guide or monitor treatment for MRSA infections. Test performance is not FDA approved in patients less than 15 years old. Performed  at [facility], [place] [address], [place], [place] [address]    *Note: Due to a large number of results and/or encounters for the requested time period, some results have not been displayed. A complete set of results can be found in Results Review.    Labs: CBC: Recent Labs  Lab 08/22/21 0031 08/23/21 0629 08/24/21 0123  WBC 7.0 7.6 8.2  NEUTROABS 4.9  --  6.0  HGB 11.6* 11.2* 10.5*  HCT 36.7* 36.7* 34.1*  MCV 97.6 95.8 96.6  PLT 264 247 794   Basic Metabolic Panel: Recent Labs  Lab 08/22/21 0031 08/23/21 0629 08/24/21 0123  NA 139 138 138  K 4.4 4.6 4.0  CL 113* 113* 113*  CO2 20* 18* 19*  GLUCOSE 142* 128* 135*  BUN 43* 40* 40*  CREATININE 3.60* 2.98* 3.38*  CALCIUM 9.1 9.0 8.4*  MG  --   --  1.7  PHOS  --   --  3.8   Liver Function Tests: Recent Labs  Lab 08/22/21 0031 08/24/21 0123  AST 21 13*  ALT 19 12  ALKPHOS 79 69  BILITOT 0.5 0.6  PROT 7.7 6.6  ALBUMIN 3.4* 2.8*   CBG: Recent Labs  Lab 08/23/21 1616 08/23/21 1918 08/24/21 0730 08/24/21 1139 08/24/21 1515  GLUCAP 139* 111* 133* 129* 179*    Discharge time spent: greater than 30 minutes.  Signed: Raiford Noble, DO [facility] 08/24/2021

## 2021-08-24 NOTE — Evaluation (Signed)
Physical Therapy Evaluation Patient Details Name: Keith [name] MRN: [id number] DOB: Dec 11, 1965 Today's Date: 08/24/2021  History of Present Illness  Pt is a 56 y/o male admitted secondary to worsening headaches and AKI. PMH includes R BKA, L AKA, HIV, DM, HTN, CKD, and gout  Clinical Impression  Pt admitted secondary to problem above with deficits below. Requiring min guard for transfers and min A for short distance gait for steadying. Required assist donning prosthetics this session. Pt reports he has been working with outpatient PT on ambulating with bilateral prosthetics. Recommend resumption at d/c. Also would benefit from RW for increased safety. Will continue to follow acutely.        Recommendations for follow up therapy are one component of a multi-disciplinary discharge planning process, led by the attending physician.  Recommendations may be updated based on patient status, additional functional criteria and insurance authorization.  Follow Up Recommendations Outpatient PT    Assistance Recommended at Discharge Frequent or constant Supervision/Assistance  Patient can return home with the following  A little help with walking and/or transfers;A little help with bathing/dressing/bathroom;Assistance with cooking/housework;Help with stairs or ramp for entrance;Assist for transportation    Equipment Recommendations Rolling walker (2 wheels)  Recommendations for Other Services       Functional Status Assessment Patient has had a recent decline in their functional status and demonstrates the ability to make significant improvements in function in a reasonable and predictable amount of time.     Precautions / Restrictions Precautions Precautions: Fall Required Braces or Orthoses: Other Brace Other Brace: prosthetics for BLE Restrictions Weight Bearing Restrictions: No      Mobility  Bed Mobility Overal bed mobility: Needs Assistance Bed Mobility: Supine to Sit     Supine  to sit: Supervision     General bed mobility comments: supervision for safety    Transfers Overall transfer level: Needs assistance Equipment used: Rolling walker (2 wheels) Transfers: Sit to/from Stand Sit to Stand: Min guard, From elevated surface           General transfer comment: Min guard for safety    Ambulation/Gait Ambulation/Gait assistance: Min assist Gait Distance (Feet): 20 Feet Assistive device: Rolling walker (2 wheels) Gait Pattern/deviations: Step-to pattern, Decreased step length - right, Decreased step length - left Gait velocity: Decreased     General Gait Details: Slow, cautious gait. Flexed posture and heavy reliance on BUE support  Stairs            Wheelchair Mobility    Modified Rankin (Stroke Patients Only)       Balance Overall balance assessment: Needs assistance Sitting-balance support: No upper extremity supported, Feet supported Sitting balance-Leahy Scale: Good     Standing balance support: Bilateral upper extremity supported Standing balance-Leahy Scale: Poor Standing balance comment: Reliant on BUE support                             Pertinent Vitals/Pain Pain Assessment Pain Assessment: No/denies pain    Home Living Family/patient expects to be discharged to:: Private residence Living Arrangements: Parent Available Help at Discharge: Family Type of Home: House Home Access: Stairs to enter Entrance Stairs-Rails: Psychiatric nurse of Steps: 3   Home Layout: One level Home Equipment: Geographical information systems officer (4 wheels)      Prior Function Prior Level of Function : Independent/Modified Independent             Mobility Comments: Uses rollator  and bilateral prosthetic for short distance ambulation. Uses WC for further mobility ADLs Comments: Able to bathe/dress independently     Hand Dominance        Extremity/Trunk Assessment   Upper Extremity  Assessment Upper Extremity Assessment: Defer to OT evaluation    Lower Extremity Assessment Lower Extremity Assessment: RLE deficits/detail;LLE deficits/detail RLE Deficits / Details: R BKA LLE Deficits / Details: L AKA    Cervical / Trunk Assessment Cervical / Trunk Assessment: Normal  Communication   Communication: No difficulties  Cognition Arousal/Alertness: Awake/alert Behavior During Therapy: WFL for tasks assessed/performed Overall Cognitive Status: No family/caregiver present to determine baseline cognitive functioning                                 General Comments: Likely close to baseline        General Comments      Exercises     Assessment/Plan    PT Assessment Patient needs continued PT services  PT Problem List Decreased strength;Decreased activity tolerance;Decreased balance;Decreased mobility;Decreased knowledge of use of DME;Decreased knowledge of precautions       PT Treatment Interventions DME instruction;Gait training;Therapeutic activities;Functional mobility training;Stair training;Therapeutic exercise;Balance training;Patient/family education    PT Goals (Current goals can be found in the Care Plan section)  Acute Rehab PT Goals Patient Stated Goal: to go home PT Goal Formulation: With patient Time For Goal Achievement: 09/07/21 Potential to Achieve Goals: Good    Frequency Min 3X/week     Co-evaluation               AM-PAC PT "6 Clicks" Mobility  Outcome Measure Help needed turning from your back to your side while in a flat bed without using bedrails?: None Help needed moving from lying on your back to sitting on the side of a flat bed without using bedrails?: None Help needed moving to and from a bed to a chair (including a wheelchair)?: A Little Help needed standing up from a chair using your arms (e.g., wheelchair or bedside chair)?: A Little Help needed to walk in hospital room?: A Little Help needed climbing  3-5 steps with a railing? : A Lot 6 Click Score: 19    End of Session Equipment Utilized During Treatment: Gait belt Activity Tolerance: Patient tolerated treatment well Patient left: in bed;with call bell/phone within reach;with bed alarm set (sitting EOB) Nurse Communication: Mobility status PT Visit Diagnosis: Unsteadiness on feet (R26.81);Muscle weakness (generalized) (M62.81);Difficulty in walking, not elsewhere classified (R26.2)    Time: 6010-9323 PT Time Calculation (min) (ACUTE ONLY): 36 min   Charges:   PT Evaluation $PT Eval Moderate Complexity: 1 Mod PT Treatments $Gait Training: 8-22 mins        Lou Miner, DPT  Acute Rehabilitation Services  Pager: [phone] Office: [phone]   Rudean Hitt 08/24/2021, 3:43 PM

## 2021-08-24 NOTE — TOC Transition Note (Signed)
Transition of Care [facility]) - CM/SW Discharge Note   Patient Details  Name: Keith [name] MRN: [id number] Date of Birth: August 02, 1965  [facility]) CM/SW Contact:  Sharin Mons, RN Phone Number: 08/24/2021, 3:50 PM   Clinical Narrative:    Patient will DC to: home Anticipated DC date: 08/24/2021 Family notified: yes Transport by: car   AKI vs worsening of CKD From home with mother.Supportive sister. Per MD patient ready for DC today . RN, patient, and patient's family notified of DC.  Referral made with [employer]  for RW. Equipment will be delivered  to bedside prior to d/c. Pt without Rx MED concerns. Post hospital f/u noted on AVS.  Cousin to provide transportation to home.  RNCM will sign off for now as intervention is no longer needed. Please consult [place] again if new needs arise.    Final next level of care: Home/Self Care Barriers to Discharge: No Barriers Identified   Patient Goals and CMS Choice     Choice offered to / list presented to : Patient  Discharge Placement    Discharge Plan and Services                DME Arranged: Walker rolling DME Agency: [employer] Date DME Agency Contacted: 08/24/21 Time DME Agency Contacted: [phone] Representative spoke with at DME Agency: [place] ([place]) Interventions     Readmission Risk Interventions    10/21/2020   11:53 AM 08/31/2020    2:30 PM  Readmission Risk Prevention Plan  Transportation Screening Complete   PCP or Specialist Appt within 3-5 Days    HRI or [place] Work Consult for [place] Planning/Counseling    [place] Screening    Medication Review Press photographer) Complete   PCP or Specialist appointment within 3-5 days of discharge Complete   HRI or [place] Complete   SW Recovery Care/Counseling Consult Complete   [place] Complete       Information is confidential and restricted. Go to Review Flowsheets to unlock data.

## 2021-08-28 ENCOUNTER — Ambulatory Visit: Payer: Medicare Other | Admitting: Physical Therapy

## 2021-08-30 ENCOUNTER — Encounter: Payer: Medicaid Other | Admitting: Physical Therapy

## 2021-09-03 IMAGING — CR DG FOOT COMPLETE 3+V*L*
3 series · 3 of 3 positions shown · non-contrast
Comparison: None.

CLINICAL DATA: Left foot wound.

EXAM:
LEFT FOOT - COMPLETE 3+ VIEW

[x foot lat left]
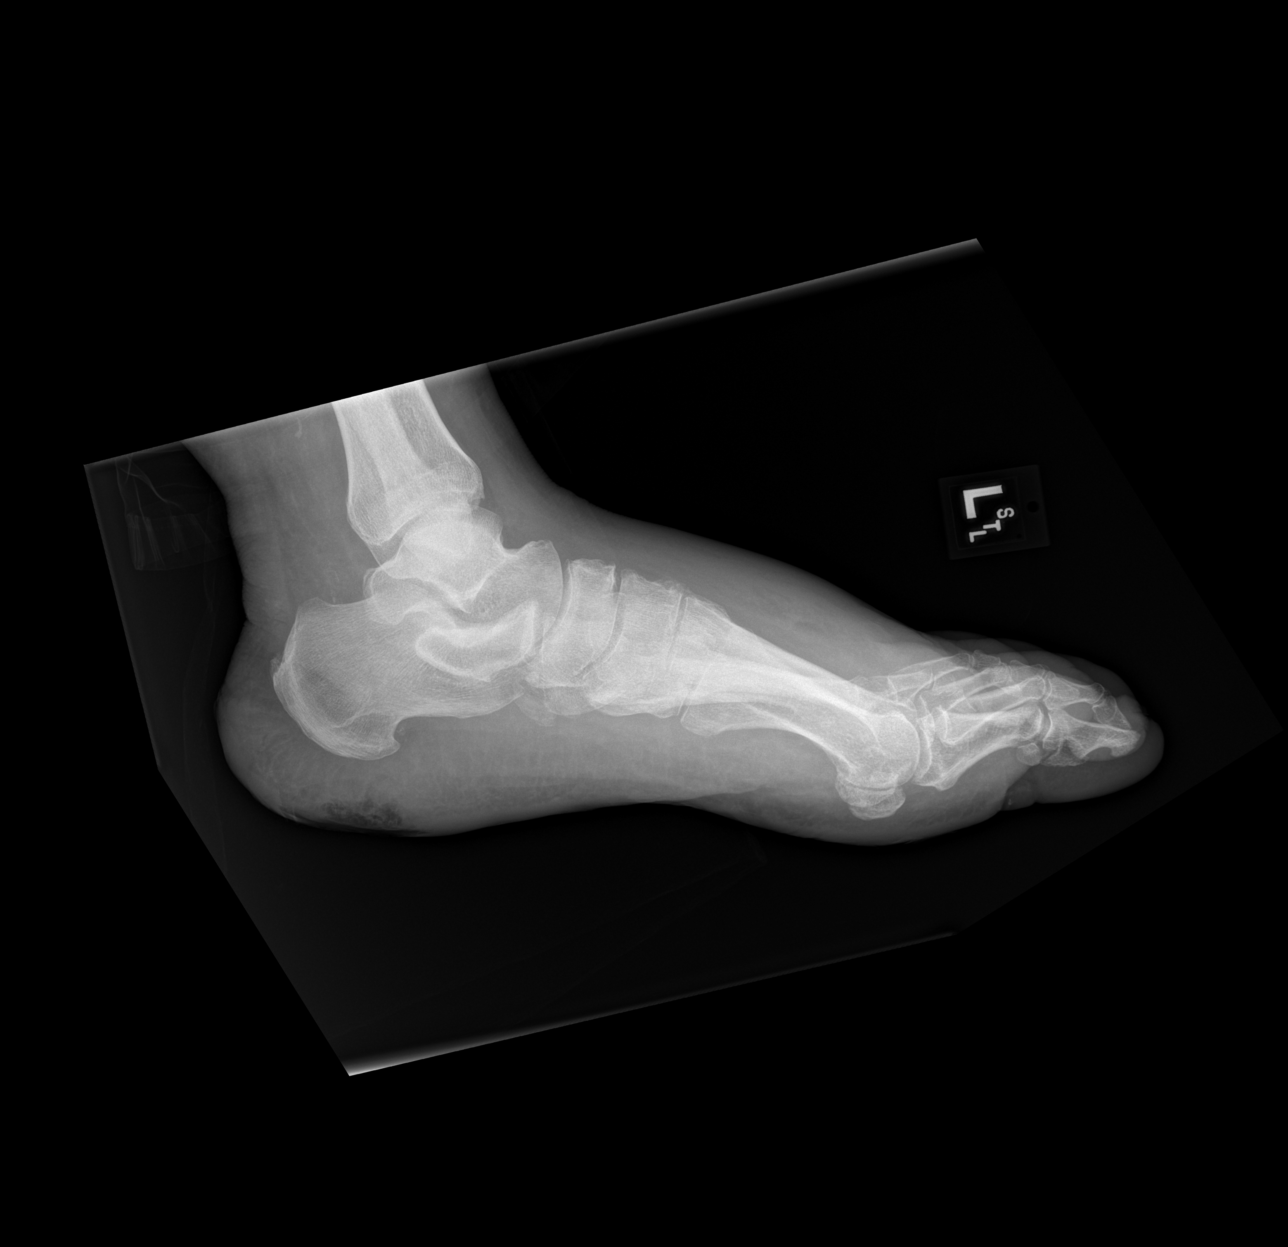

[x foot ap left]
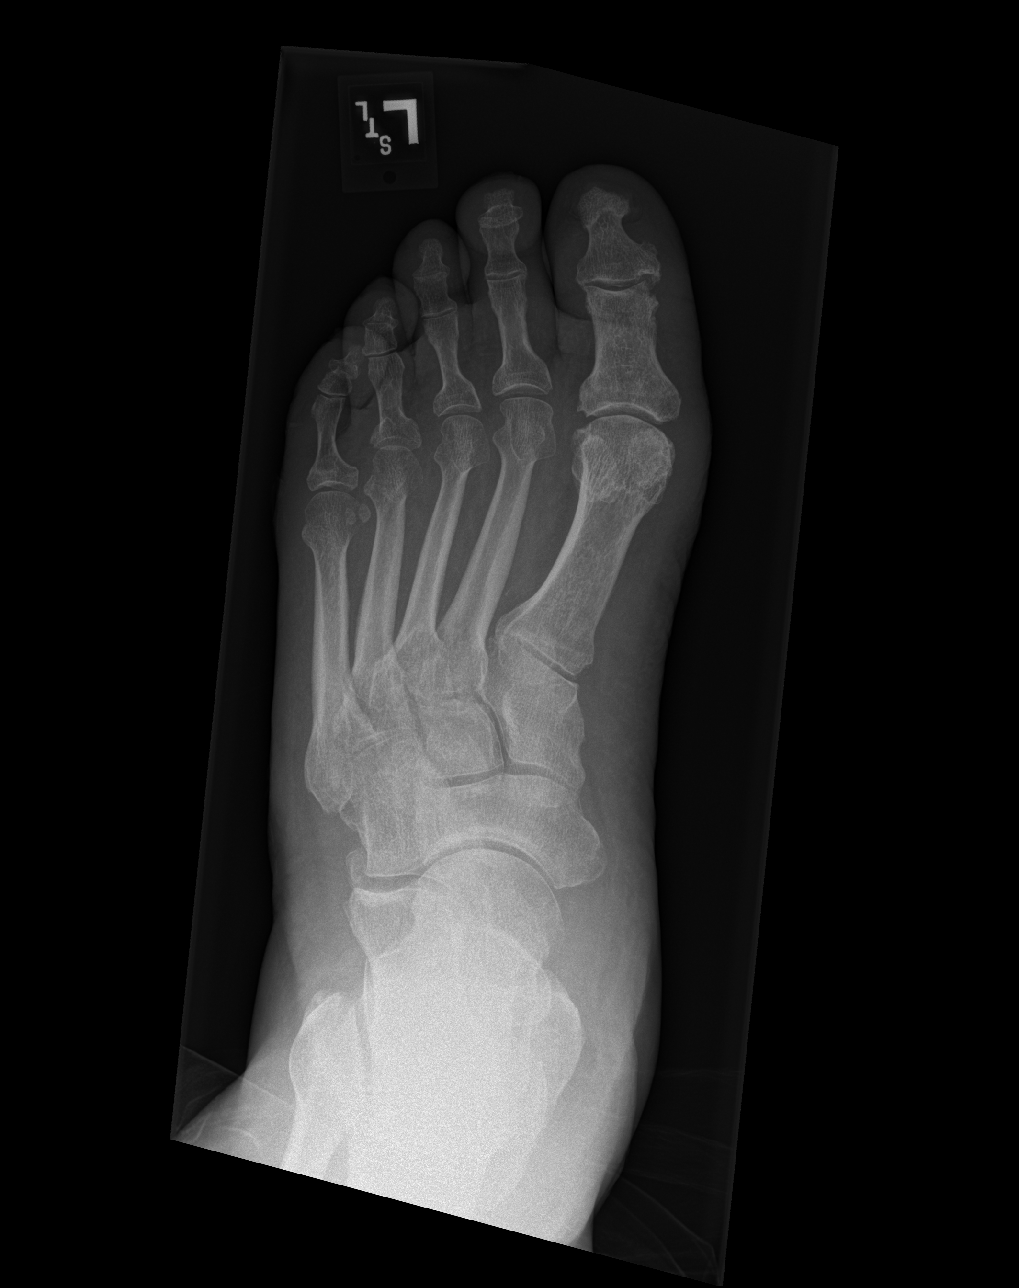

[x foot obl left]
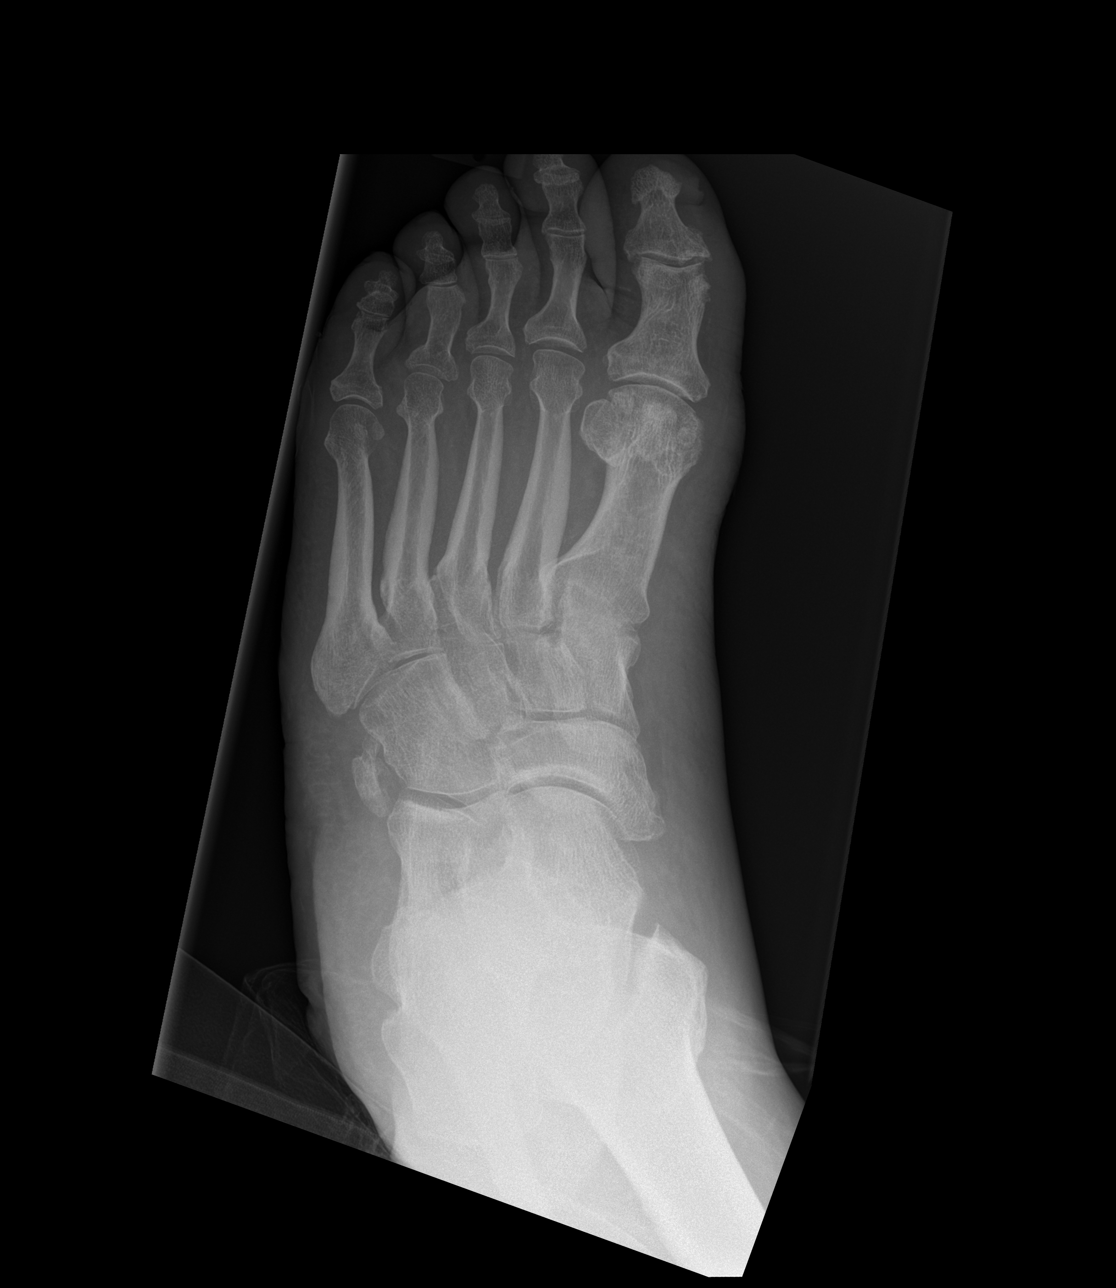

[3 of 3 positions shown; findings below may reference images not displayed]

FINDINGS: There is no evidence of fracture or dislocation. There is no
evidence of arthropathy or other focal bone abnormality. Dorsal soft
tissue swelling is noted. Soft tissue ulceration is seen inferior to
posterior calcaneus. No lytic destruction is seen to suggest
osteomyelitis.
IMPRESSION: Soft tissue ulceration seen inferior to posterior calcaneus. No
definite evidence of osteomyelitis.

## 2021-09-04 ENCOUNTER — Telehealth: Payer: Self-pay

## 2021-09-04 ENCOUNTER — Ambulatory Visit: Payer: Medicare Other

## 2021-09-04 NOTE — Telephone Encounter (Signed)
Patient Name: Keith [name] MRN: [id number] DOB:[birth year], 56 y.o., male Today's Date: 09/04/2021   Called and spoke with patient reminding him that he missed his appt at 2pm today. Pt was reminded that he has not been seen since his PT eval on 08/15/21 and has had >3 cancellations/no shows. Pt was reminded him of our cancellation and no show policy and excessive cancellations can result in discharge from therapy. Pt was reminded of his next appt on 09/06/21 at 2 pm.   Kerrie Pleasure, PT 09/04/2021, 2:28 PM

## 2021-09-06 ENCOUNTER — Ambulatory Visit: Payer: Medicare Other | Attending: Orthopedic Surgery

## 2021-09-06 DIAGNOSIS — Z89511 Acquired absence of right leg below knee: Secondary | ICD-10-CM | POA: Diagnosis present

## 2021-09-06 DIAGNOSIS — M6281 Muscle weakness (generalized): Secondary | ICD-10-CM | POA: Insufficient documentation

## 2021-09-06 DIAGNOSIS — Z89612 Acquired absence of left leg above knee: Secondary | ICD-10-CM | POA: Insufficient documentation

## 2021-09-06 DIAGNOSIS — R2689 Other abnormalities of gait and mobility: Secondary | ICD-10-CM | POA: Insufficient documentation

## 2021-09-06 NOTE — Therapy (Signed)
OUTPATIENT PHYSICAL THERAPY PROSTHETICS TREATMENT/re-cert note.   Patient Name: Keith [name] MRN: [id number] DOB:[birth year], 56 y.o., male Today's Date: 09/06/2021  PCP: Dionisio David, NP REFERRING PROVIDER: [name]   PT End of Session - 09/06/21 1512     Visit Number 2    Number of Visits 26    Date for PT Re-Evaluation 11/29/21    Authorization Type UHC Medicare/Medicaid: follow Medicare guidelines; recert on 09/09/99    Authorization Time Period Eval 08/15/21    Progress Note Due on Visit 12    PT Start Time 1400    PT Stop Time 1500    PT Time Calculation (min) 60 min    Equipment Utilized During Treatment Gait belt    Activity Tolerance Patient tolerated treatment well    Behavior During Therapy WFL for tasks assessed/performed             Past Medical History:  Diagnosis Date   Abscess of right foot    abscess/ulcer of R transtibial amputation requiring IV abx   AIDS ([place])    Anemia    CAD (coronary artery disease)    a. MI with stenting of OM1 in 11/2018 with residual disease. b. acute STEMI 03/2020 s/p DES to OM1   Chronic anemia    Chronic knee pain    right   Chronic pain    CKD (chronic kidney disease), stage IV (HCC)    CVA (cerebral vascular accident) ([place]) 04/2020   Diabetes type 2, uncontrolled    HgA1c 17.6 (04/27/2010)   Diabetic foot ulcer ([place]) 01/2017   right foot   Dilatation of aorta (HCC)    Erectile dysfunction    Genital warts    GERD (gastroesophageal reflux disease)    History of blood transfusion    HIV (human immunodeficiency virus infection) ([place]) 2009   CD4 count 100, VL 13800 (05/01/2010)   Hyperlipidemia    Hypertension    Myocardial infarction ([place])    Neuropathy    Noncompliance with medication regimen    Osteomyelitis ([place])    h/o hand   Osteomyelitis of metatarsal ([place]) 04/28/2017   Pneumonia    Past Surgical History:  Procedure Laterality Date   AMPUTATION Right 05/02/2017   Procedure: AMPUTATION TRANSMETARSAL;   Surgeon: [name], MD;  Location: [place];  Service: Orthopedics;  Laterality: Right;   AMPUTATION Right 06/13/2017   Procedure: RIGHT BELOW KNEE AMPUTATION;  Surgeon: [name], MD;  Location: [place];  Service: Orthopedics;  Laterality: Right;   AMPUTATION Left 10/27/2020   Procedure: LEFT BELOW KNEE AMPUTATION;  Surgeon: [name], MD;  Location: [place];  Service: Orthopedics;  Laterality: Left;   AMPUTATION Left 11/17/2020   Procedure: REVISION AMPUTATION BELOW KNEE, LEFT;  Surgeon: [name], MD;  Location: [place];  Service: Orthopedics;  Laterality: Left;   AMPUTATION Left 01/03/2021   Procedure: LEFT ABOVE KNEE AMPUTATION;  Surgeon: [name], MD;  Location: [place];  Service: Orthopedics;  Laterality: Left;   APPLICATION OF WOUND VAC Left 10/27/2020   Procedure: APPLICATION OF WOUND VAC;  Surgeon: [name], MD;  Location: [place];  Service: Orthopedics;  Laterality: Left;   BELOW KNEE LEG AMPUTATION Right 06/13/2017   BELOW KNEE LEG AMPUTATION     CORONARY STENT INTERVENTION N/A 11/10/2018   Procedure: CORONARY STENT INTERVENTION;  Surgeon: Leonie Man, MD;  Location: [place] CV [facility];  Service: Cardiovascular;  Laterality: N/A;   CORONARY/GRAFT ACUTE  MI REVASCULARIZATION N/A 03/21/2020   Procedure: Coronary/Graft Acute MI Revascularization;  Surgeon: [name], MD;  Location: [place] [facility];  Service: Cardiovascular;  Laterality: N/A;   CYSTOSCOPY N/A 11/09/2020   Procedure: CYSTOSCOPY, EVACUATION OF CLOTS, FULGERATION OF BLADDER AND BILATERIAL RETROGRADE PYELOGRAMS;  Surgeon: Janith Lima, MD;  Location: [place];  Service: Urology;  Laterality: N/A;   HERNIA REPAIR     I & D EXTREMITY Left 08/21/2014   Procedure: INCISION AND DRAINAGE LEFT SMALL FINGER;  Surgeon: Leanora Cover, MD;  Location: [place];  Service: Orthopedics;  Laterality: Left;   I & D EXTREMITY Right 03/18/2017   Procedure: IRRIGATION AND DEBRIDEMENT EXTREMITY;  Surgeon: [name], MD;   Location: [place];  Service: Orthopedics;  Laterality: Right;   IR FLUORO GUIDE CV LINE RIGHT  11/02/2020   IR REMOVAL TUN CV CATH W/O FL  11/13/2020   IR US GUIDE VASC ACCESS RIGHT  11/02/2020   LEFT HEART CATH AND CORONARY ANGIOGRAPHY N/A 11/10/2018   Procedure: LEFT HEART CATH AND CORONARY ANGIOGRAPHY;  Surgeon: Leonie Man, MD;  Location: [place] CV [facility];  Service: Cardiovascular;  Laterality: N/A;   LEFT HEART CATH AND CORONARY ANGIOGRAPHY N/A 03/21/2020   Procedure: LEFT HEART CATH AND CORONARY ANGIOGRAPHY;  Surgeon: [name], MD;  Location: [place] [facility];  Service: Cardiovascular;  Laterality: N/A;   MINOR IRRIGATION AND DEBRIDEMENT OF WOUND Right 04/22/2014   Procedure: IRRIGATION AND DEBRIDEMENT OF RIGHT NECK ABCESS;  Surgeon: Jerrell Belfast, MD;  Location: [place];  Service: ENT;  Laterality: Right;   MULTIPLE EXTRACTIONS WITH ALVEOLOPLASTY N/A 01/18/2013   Procedure: MULTIPLE EXTRACION 3, 6, 7, 10, 11, 13, 21, 22, 27, 28, 29, 30 WITH ALVEOLOPLASTY;  Surgeon: Gae Bon, DDS;  Location: [place];  Service: Oral Surgery;  Laterality: N/A;   SKIN SPLIT GRAFT Right 03/21/2017   Procedure: IRRIGATION AND DEBRIDEMENT RIGHT FOOT AND APPLY SPLIT THICKNESS SKIN GRAFT AND WOUND VAC;  Surgeon: [name], MD;  Location: [place];  Service: Orthopedics;  Laterality: Right;   STUMP REVISION Left 12/02/2020   Procedure: REVISION LEFT BELOW KNEE AMPUTATION;  Surgeon: [name], MD;  Location: [place];  Service: Orthopedics;  Laterality: Left;   TEE WITHOUT CARDIOVERSION N/A 05/02/2017   Procedure: TRANSESOPHAGEAL ECHOCARDIOGRAM (TEE);  Surgeon: Acie Fredrickson Wonda Cheng, MD;  Location: [facility];  Service: Cardiovascular;  Laterality: N/A;  coincidental to orthopedic case   Patient Active Problem List   Diagnosis Date Noted   Headache 08/22/2021   Anxiety 02/13/2021   Healthcare maintenance 02/13/2021   History of left above knee amputation ([place]) 02/07/2021   Wound dehiscence 01/03/2021    Coagulopathy ([place]) 12/22/2020   CAD (coronary artery disease) 12/22/2020   Obesity 12/22/2020   Gross hematuria 12/20/2020   ABLA (acute blood loss anemia) 12/20/2020   Dehiscence of amputation stump (HCC)    Hx of BKA, left (HCC)    Cellulitis of left foot    Acute osteomyelitis of left calcaneus (HCC)    Ulcer of heel due to diabetes mellitus (HCC)    Normochromic normocytic anemia    Cellulitis 10/18/2020   Atypical chest pain 42/35/3614   Acute metabolic encephalopathy 43/15/4008   Hyperglycemia due to diabetes mellitus ([place]) 08/29/2020   Hyperglycemia 08/29/2020   Acute cerebrovascular accident (CVA) due to ischemia ([place]) 04/28/2020   Type 2 diabetes mellitus with hyperlipidemia ([place])    PAD (peripheral artery disease) ([place])  Hypoglycemia 04/25/2020   Acute ST elevation myocardial infarction (STEMI) due to occlusion of circumflex coronary artery ([place]) 03/22/2020   Uncontrolled type 2 diabetes mellitus with hyperglycemia ([place]) 01/14/2019   Elevated glucose 01/14/2019   Hx of BKA, right ([place]) 01/14/2019   Pressure injury of skin 11/11/2018   STEMI (ST elevation myocardial infarction) ([place]) 11/10/2018   CKD (chronic kidney disease) stage 4, GFR 15-29 ml/min ([place]) 11/10/2018   Amputation stump infection ([place]) 10/28/2018   Type II diabetes mellitus with renal manifestations ([place]) 08/07/2018   Uncontrolled type 2 diabetes mellitus with hyperosmolar nonketotic hyperglycemia ([place]) 08/07/2018   Non-pressure chronic ulcer of right calf limited to breakdown of skin ([place]) 07/06/2018   Type 2 diabetes mellitus without complication, without long-term current use of insulin ([place]) 06/15/2018   Chronic low back pain without sciatica 06/15/2018   Idiopathic chronic venous hypertension of left lower extremity with ulcer and inflammation (HCC)    Venous stasis ulcer of left calf limited to breakdown of skin without varicose veins ([place]) 04/23/2018   Acquired absence of right leg below knee ([place])  06/13/2017   Acute kidney injury superimposed on CKD ([place]) 03/15/2017   Diabetic polyneuropathy associated with type 2 diabetes mellitus ([place]) 01/23/2017   AKI (acute kidney injury) ([place]) 09/28/2016   Nausea vomiting and diarrhea 09/28/2016   Onychomycosis of multiple toenails with type 2 diabetes mellitus ([place]) 08/29/2015   MRSA carrier 04/20/2014   Penile wart 02/22/2014   HIV disease ([place])    Insulin-requiring or dependent type II diabetes mellitus ([place]) 02/04/2014   Dental anomaly 11/20/2012   Arthritis of right knee 02/23/2012   Hyperlipidemia 11/10/2011   Hyponatremia 11/10/2011   Chronic pain 08/07/2011   Meralgia paraesthetica 04/23/2011   ERECTILE DYSFUNCTION 08/22/2008   HTN (hypertension) 05/19/2008    ONSET DATE: 05/09/21- approximate date of receipt of prosthesis as pt didn't remember exact date  REFERRING DIAG: Z89.511 (ICD-10-CM) - Hx of right BKA ([place]) Z89.612 (ICD-10-CM) - History of left above knee amputation ([place])   THERAPY DIAG:  Other abnormalities of gait and mobility  Muscle weakness (generalized)  Hx of right BKA ([place])  Hx of AKA (above knee amputation), left ([place])  SUBJECTIVE:   SUBJECTIVE STATEMENT: Pt reports he had R BKA done 3 years with [name] and L AKA done in 11/2020. He was in nursing home until April 2023. Pt keeps the R prosthesis on all day long. Pt received his L prosthetic leg 2-3 months ago from [employer]. 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 Pt is currently wearing L prosthesis for 8 hours continuous a day. Pt is denying any redness or pain. Pt reports of phantom pain that is intermittent in bil LE. Pt takes oxycodone daily. Pt accompanied by: self  PERTINENT HISTORY: HIV, DM, CAD, Stage IV CKD, hx of CVA 2 years ago   PAIN:  Are you having pain? No   PRECAUTIONS: Fall  WEIGHT BEARING RESTRICTIONS No  FALLS: Has patient fallen in last 6 months? Yes. Number of falls 4  LIVING ENVIRONMENT: Lives with:  Elderly mom, pt is hoping he  will have aide soon. Lives in: House/apartment Home Access: Stairs to enter and 3 steps with rail on [place] layout: One level Has following equipment at home: Environmental consultant - 4 wheeled, Shower bench, and Grab bars  PLOF: Independent  PATIENT GOALS To learn how to walk, improve balance  OBJECTIVE:   COGNITION: Overall cognitive status: Within functional limits for tasks assessed    BED  MOBILITY: I according to patient   [facility] PT Assessment -       Standardized Balance Assessment   Standardized Balance Assessment Berg Balance Test      Berg Balance Test   Sit to Stand Needs minimal aid to stand or to stabilize    Standing Unsupported Unable to stand 30 seconds unassisted    Sitting with Back Unsupported but Feet Supported on Floor or Stool Able to sit safely and securely 2 minutes    Stand to Sit Sits independently, has uncontrolled descent    Transfers Needs one person to assist    Standing Unsupported with Eyes Closed Needs help to keep from falling    Standing Unsupported with Feet Together Needs help to attain position and unable to hold for 15 seconds    From Standing, Reach Forward with Outstretched Arm Loses balance while trying/requires external support    From Standing Position, Pick up Object from Floor Unable to try/needs assist to keep balance    From Standing Position, Turn to Look Behind Over each Shoulder Needs assist to keep from losing balance and falling    Turn 360 Degrees Needs assistance while turning    Standing Unsupported, Alternately Place Feet on Step/Stool Needs assistance to keep from falling or unable to try    Standing Unsupported, One Foot in Front Loses balance while stepping or standing    Standing on One Leg Unable to try or needs assist to prevent fall    Total Score 7    Berg comment: 7/56 high risk of fall.                 TODAY'S TREATMENT:   Pt drovehimself today. He had to ask an elderly lady to push him in his rollator to the  therapy waiting area. Pt was taken back to room on his rollator where PT pushed him and Pt assisted 25% with his R leg. Pt had left leg on but it was just sitting in his pants he didn't have sleeve on his residual leg and socket was not attached to his leg. Pt taken to the room. Transerred pt from rollator to mat table with lateral stand pivot transfer with mod A for safety Pt then took his pants off. Pt had a bowel accident so spent majority. Pt was given wash clothes to clean himself and new diaper to put on. Assisted pt taking his old pants off his prosthetic legs. While patient was cleaning himself, we discussed prosthetic wear time. Pt is currently wearing leg for 8 hours a day. Pt has a open blister proximal to mid lateral thigh on L residual leg. Pt was educated to practice taking prosthetic on, wiping inner sleeve with wash clothe and wiping sweat off the residual leg and letting skin air dry. Discussed excessive sweat will make his skin softer and more prone to skin breakdown. He needs to manage sweat by taking the sleeve and prosthesis off 2-3 x day. We discussed prosthetic cleaning nightly. Pt verbalized correct steps but he was soaking his sleeve or 2-3 hours in soapy water at night. Pt educated that he should jst clean the inner sleeve with soapy water and he doesnot need to soak it for that long. Pt was educated that he should not ask other patrons to push him in his rollator or ask our front desk staff as they are not trained in proper transfer or patient handling and it puts others at risk of injury along with himself.  Pt verbalized understanding.  Pt was not donning the L sleeve on properly. Pt educated on lateral lines to medial and lateral and words on sleeve to be on top of the thigh when he puts it on. Pt needed min A with sleeve to prevent wrinkles. Pt then needed min A to put 3 ply sock on sleeve as he was not able to pull it tight to prevent wringles at bottom.  Pt then able to put  socket on with prosthetic c leg. Pt needed max A to stand up from standard height surace with both prosthesis on. We performed sit to stand transfer 4 x during the session and required max A from mat table, chair, and his rollator 2x Pt ambulated 2x 40 feet with rollator, with forward flex, forward lean, required cues to stand up tall. Adjusted his walker handle height appropriately for his height to improve erect posture.  Pt was then pushed to his car in his rollator. Pt needed max A to stand up from rollator to go in the car. Put his rolltor in the back seat for patient per patient's request.   Pt was educated that he should be looking into medical transportation to come to therapy and back to home as it is not safe for him to drive to therapy, get his walker out, walk to therapy and walk back to car. Pt is using too much energy with getting to the car, getting out of the car and he is unable to Independently walking to therapy as he required assistance of someone to bring him to therapy today and during his initial evaluation.   Pt verbalized understanding.   PATIENT EDUCATION: Education details: see above Person educated: Patient Education method: Explanation Education comprehension: verbalized understanding   HOME EXERCISE PROGRAM: TBD  ASSESSMENT:  CLINICAL IMPRESSION: See above. Goal dates are being updated. Goals were not assessed due to pt having accident and requiring majority of session to train patient on donning/dofing prosthesis and to bring him to his car.   OBJECTIVE IMPAIRMENTS Abnormal gait, decreased activity tolerance, decreased balance, decreased endurance, decreased mobility, difficulty walking, decreased ROM, decreased strength, decreased safety awareness, hypomobility, increased edema, increased fascial restrictions, increased muscle spasms, impaired flexibility, impaired sensation, improper body mechanics, postural dysfunction, prosthetic dependency , and pain.    ACTIVITY LIMITATIONS cleaning, community activity, driving, meal prep, and laundry.   PERSONAL FACTORS Past/current experiences and Time since onset of injury/illness/exacerbation are also affecting patient's functional outcome.    REHAB POTENTIAL: Good  CLINICAL DECISION MAKING: Stable/uncomplicated  EVALUATION COMPLEXITY: Moderate   GOALS: Goals reviewed with patient? Yes  SHORT TERM GOALS: Target date: 10/18/2021    Patient will demo I with correct donning/doffing of L prosthetic leg without verbal cues and show independent adjustment of the prosthetic leg with prolonged WB by adjusting pull strap and modifying ply socks. Baseline: Education initiated (08/15/21) Goal status: INITIAL  2.  Patient will be able to ambulate 63' with RW and CGA to improve in home mobility with rollator. Baseline: 52feet (08/15/21) Goal status: INITIAL  3.  Patient will be able to maintain standing balance with one UE while performing dynamic reaching to improve functional balance. Baseline: not attempted (08/15/21) Goal status: INITIAL  4.  Patient will be able to perform chair to chair transfer with SBA from standard chair to standard chair with Rolator and SBA Baseline: Rolator and requires mod A (08/15/21) Goal status: INITIAL   LONG TERM GOALS: Target date: 11/29/2021  Patient will demo TUG score <60 sec with appropriate AD and SBA to improve functional mobility and decrease fall risk Baseline: 87 sec with Rolator and Min A (08/15/21) Goal status: INITIAL  2.  Patient will demo at least 0.5m/s improvement in his functional gait speed to improve community ambulation Baseline: TBD(08/15/21) Goal status: INITIAL  3.  Patient will demo at least 8 points improvement in his BBS to improve his functional standing balance. Baseline: 7/56 high risk of fall (08/15/21) Goal status: INITIAL  4.  Pt will be I with prosthetic donning/doffing, residual limb care, ply sock adjustment to  improve independence with prosthetic management. Baseline: Education initiated (08/15/21) Goal status: INITIAL  5.  Pt will demo 12 hours of total wear tolerance with L prosthetic leg to improve functional use of prosthetic leg with functional activities. Baseline: 8 hours (08/15/21) Goal status: INITIAL     PLAN: PT FREQUENCY: 2x/week  PT DURATION: 12 weeks  PLANNED INTERVENTIONS: Therapeutic exercises, Therapeutic activity, Neuromuscular re-education, Balance training, Gait training, Patient/Family education, Joint mobilization, Stair training, Prosthetic training, Wheelchair mobility training, Cryotherapy, Moist heat, Manual lymph drainage, Compression bandaging, and Manual therapy  PLAN FOR NEXT SESSION: Assess 10 meter gait speed, education for proper donning/doffing, initiate sit to stand at counter with weight shifts and strap adjustment with prolonged WB   Kerrie Pleasure, PT 09/06/2021, 3:27 PM

## 2021-09-10 ENCOUNTER — Encounter (HOSPITAL_COMMUNITY): Payer: Self-pay | Admitting: Emergency Medicine

## 2021-09-10 ENCOUNTER — Emergency Department (HOSPITAL_COMMUNITY): Payer: Medicare Other

## 2021-09-10 ENCOUNTER — Emergency Department (HOSPITAL_COMMUNITY)
Admission: EM | Admit: 2021-09-10 | Discharge: 2021-09-14 | Disposition: A | Discharge status: Skilled Nursing Facility | Payer: Medicare Other | Attending: Emergency Medicine | Admitting: Emergency Medicine

## 2021-09-10 DIAGNOSIS — Z794 Long term (current) use of insulin: Secondary | ICD-10-CM | POA: Insufficient documentation

## 2021-09-10 DIAGNOSIS — R262 Difficulty in walking, not elsewhere classified: Secondary | ICD-10-CM

## 2021-09-10 DIAGNOSIS — E1122 Type 2 diabetes mellitus with diabetic chronic kidney disease: Secondary | ICD-10-CM | POA: Diagnosis not present

## 2021-09-10 DIAGNOSIS — W01198A Fall on same level from slipping, tripping and stumbling with subsequent striking against other object, initial encounter: Secondary | ICD-10-CM | POA: Diagnosis not present

## 2021-09-10 DIAGNOSIS — R519 Headache, unspecified: Secondary | ICD-10-CM | POA: Diagnosis not present

## 2021-09-10 DIAGNOSIS — N189 Chronic kidney disease, unspecified: Secondary | ICD-10-CM | POA: Diagnosis not present

## 2021-09-10 DIAGNOSIS — B20 Human immunodeficiency virus [HIV] disease: Secondary | ICD-10-CM | POA: Diagnosis not present

## 2021-09-10 DIAGNOSIS — R296 Repeated falls: Secondary | ICD-10-CM | POA: Diagnosis not present

## 2021-09-10 DIAGNOSIS — I2510 Atherosclerotic heart disease of native coronary artery without angina pectoris: Secondary | ICD-10-CM | POA: Insufficient documentation

## 2021-09-10 LAB — CBC WITH DIFFERENTIAL/PLATELET
Abs Immature Granulocytes: 0.01 10*3/uL (ref 0.00–0.07)
Basophils Absolute: 0 10*3/uL (ref 0.0–0.1)
Basophils Relative: 1 %
Eosinophils Absolute: 0.1 10*3/uL (ref 0.0–0.5)
Eosinophils Relative: 2 %
HCT: 37.2 % — ABNORMAL LOW (ref 39.0–52.0)
Hemoglobin: 11.9 g/dL — ABNORMAL LOW (ref 13.0–17.0)
Immature Granulocytes: 0 %
Lymphocytes Relative: 24 %
Lymphs Abs: 1.4 10*3/uL (ref 0.7–4.0)
MCH: 29.8 pg (ref 26.0–34.0)
MCHC: 32 g/dL (ref 30.0–36.0)
MCV: 93.2 fL (ref 80.0–100.0)
Monocytes Absolute: 0.5 10*3/uL (ref 0.1–1.0)
Monocytes Relative: 8 %
Neutro Abs: 3.6 10*3/uL (ref 1.7–7.7)
Neutrophils Relative %: 65 %
Platelets: 237 10*3/uL (ref 150–400)
RBC: 3.99 MIL/uL — ABNORMAL LOW (ref 4.22–5.81)
RDW: 12.9 % (ref 11.5–15.5)
WBC: 5.7 10*3/uL (ref 4.0–10.5)
nRBC: 0 % (ref 0.0–0.2)

## 2021-09-10 LAB — COMPREHENSIVE METABOLIC PANEL
ALT: 14 U/L (ref 0–44)
AST: 16 U/L (ref 15–41)
Albumin: 2.9 g/dL — ABNORMAL LOW (ref 3.5–5.0)
Alkaline Phosphatase: 99 U/L (ref 38–126)
Anion gap: 7 (ref 5–15)
BUN: 35 mg/dL — ABNORMAL HIGH (ref 6–20)
CO2: 22 mmol/L (ref 22–32)
Calcium: 8.6 mg/dL — ABNORMAL LOW (ref 8.9–10.3)
Chloride: 106 mmol/L (ref 98–111)
Creatinine, Ser: 2.79 mg/dL — ABNORMAL HIGH (ref 0.61–1.24)
GFR, Estimated: 26 mL/min — ABNORMAL LOW (ref >60)
Glucose, Bld: 357 mg/dL — ABNORMAL HIGH (ref 70–99)
Potassium: 4.1 mmol/L (ref 3.5–5.1)
Sodium: 135 mmol/L (ref 135–145)
Total Bilirubin: 0.5 mg/dL (ref 0.3–1.2)
Total Protein: 7.1 g/dL (ref 6.5–8.1)

## 2021-09-10 NOTE — ED Provider Notes (Signed)
[facility] EMERGENCY DEPARTMENT Provider Note   CSN: [id number] Arrival date & time: 09/10/21  1303     History  Chief Complaint  Patient presents with   Keith [name] is a 56 y.o. male.  The history is provided by the patient and medical records. No language interpreter was used.  Fall This is a recurrent problem. The current episode started 6 to 12 hours ago. The problem has not changed since onset.Associated symptoms include headaches. Pertinent negatives include no chest pain, no abdominal pain and no shortness of breath. Nothing aggravates the symptoms. Nothing relieves the symptoms. He has tried nothing for the symptoms. The treatment provided no relief.      Home Medications Prior to Admission medications   Medication Sig Start Date End Date Taking? Authorizing Provider  acetaminophen (TYLENOL) 325 MG tablet Take 2 tablets (650 mg total) by mouth every 4 (four) hours as needed for mild pain, fever or headache. Patient taking differently: Take 650 mg by mouth every 6 (six) hours as needed for mild pain. 05/03/20   Debbe Odea, MD  allopurinol (ZYLOPRIM) 100 MG tablet Take 1 tablet (100 mg total) by mouth daily. Patient not taking: Reported on 08/23/2021 12/26/20 08/22/21  [name], MD  ALPRAZolam [name]) 1 MG tablet Take 1 tablet (1 mg total) by mouth daily as needed for anxiety. Patient not taking: Reported on 08/23/2021 02/13/21   Golden Circle, FNP  aspirin 81 MG EC tablet Take 1 tablet (81 mg total) by mouth daily. Patient not taking: Reported on 08/23/2021 03/27/20   [name], MD  atorvastatin (LIPITOR) 80 MG tablet Take 1 tablet (80 mg total) by mouth daily. Patient not taking: Reported on 08/23/2021 09/01/20 01/02/22  [name], MD  blood glucose meter kit and supplies Dispense based on patient and insurance preference. Use up to four times daily as directed. (FOR ICD-10 E10.9, E11.9). 10/17/20   [name], NP   carvedilol (COREG) 3.125 MG tablet Take 1 tablet (3.125 mg total) by mouth 2 (two) times daily. 12/26/20 10/13/21  [name], MD  Cholecalciferol (VITAMIN D3) 1.25 MG (50000 UT) CAPS Take 50,000 Units by mouth once a week. Thursday    [provider]  dapsone 100 MG tablet TAKE 1 TABLET (100 MG TOTAL) BY MOUTH DAILY. Patient not taking: Reported on 08/23/2021 02/13/21 02/13/22  Golden Circle, FNP  dolutegravir-lamiVUDine (DOVATO) 50-300 MG tablet Take 1 tablet by mouth daily. 02/13/21   Golden Circle, FNP  doravirine (PIFELTRO) 100 MG TABS tablet Take 1 tablet (100 mg total) by mouth daily. 02/13/21   Golden Circle, FNP  escitalopram (LEXAPRO) 20 MG tablet Take 1 tablet (20 mg total) by mouth daily as needed. Patient taking differently: Take 20 mg by mouth daily. 02/13/21   Golden Circle, FNP  ferrous sulfate 325 (65 FE) MG tablet Take 1 tablet (325 mg total) by mouth every Monday, Wednesday, and Friday at 6 PM. Do not take with other medicines. Patient taking differently: Take 325 mg by mouth every Monday, Wednesday, and Friday. 12/27/20 10/13/21  Elodia Florence., MD  finasteride (PROSCAR) 5 MG tablet Take 5 mg by mouth daily. 08/20/21   [provider]  gabapentin (NEURONTIN) 100 MG capsule Take 1 capsule (100 mg total) by mouth 3 (three) times daily. Patient not taking: Reported on 08/23/2021 09/01/20 01/02/22  [name], MD  glucose blood (COOL BLOOD GLUCOSE TEST STRIPS) test  strip Use as instructed 02/16/20   [name], FNP  icosapent Ethyl (VASCEPA) 1 g capsule TAKE 2 CAPSULES (2 G TOTAL) BY MOUTH TWO TIMES DAILY. Patient taking differently: Take 2 g by mouth 2 (two) times daily. 03/27/20 10/13/21  [name], MD  insulin aspart (NOVOLOG) 100 UNIT/ML FlexPen Inject 10 Units into the skin 3 (three) times daily with meals.    [provider]  insulin glargine (LANTUS) 100 UNIT/ML Solostar Pen Inject 10 Units into the skin daily.     [provider]  Lancets [facility] ULTRASOFT) lancets Use as instructed 02/16/20   [name], FNP  nitroGLYCERIN (NITROSTAT) 0.4 MG SL tablet Place 0.4 mg under the tongue every 5 (five) minutes as needed for chest pain.    [provider]  oxybutynin (DITROPAN) 5 MG tablet Take 1/2 tablet (2.5 mg total) by mouth 2 (two) times daily. 12/26/20   [name], MD  oxyCODONE-acetaminophen (PERCOCET) 10-325 MG tablet Take 1 tablet by mouth every 4 (four) hours as needed for pain. Patient taking differently: Take 1 tablet by mouth every 4 (four) hours as needed (severe pain). 01/10/21   [name], MD  pantoprazole (PROTONIX) 40 MG tablet Take 1 tablet (40 mg total) by mouth daily. Patient not taking: Reported on 08/23/2021 08/17/21 09/16/21  [name], MD  sodium bicarbonate 650 MG tablet Take 1 tablet (650 mg total) by mouth 2 (two) times daily. 08/24/21   Raiford Noble Latif, DO  tamsulosin (FLOMAX) 0.4 MG CAPS capsule Take 0.4 mg by mouth daily.    [provider]  traZODone (DESYREL) 150 MG tablet Take 75 mg by mouth at bedtime. 08/20/21   [provider]      Allergies    Elemental sulfur and Sulfa antibiotics    Review of Systems   Review of Systems  Constitutional:  Positive for fatigue. Negative for chills, diaphoresis and fever.  HENT:  Negative for congestion.   Eyes:  Negative for visual disturbance.  Respiratory:  Negative for cough, chest tightness and shortness of breath.   Cardiovascular:  Negative for chest pain.  Gastrointestinal:  Positive for diarrhea. Negative for abdominal pain, constipation, nausea and vomiting.  Genitourinary:  Negative for dysuria and flank pain.  Musculoskeletal:  Negative for back pain, neck pain and neck stiffness.  Skin:  Positive for wound (abrasion on L thigh). Negative for rash.  Neurological:  Positive for headaches. Negative for weakness, light-headedness and numbness.   Psychiatric/Behavioral:  Negative for agitation and confusion.   All other systems reviewed and are negative.  Physical Exam Updated Vital Signs BP (!) 150/95 (BP Location: Right Arm)   Pulse 65   Temp 98 F (36.7 C) (Oral)   Resp 17   SpO2 98%  Physical Exam Vitals and nursing note reviewed.  Constitutional:      General: He is not in acute distress.    Appearance: He is well-developed. He is not ill-appearing, toxic-appearing or diaphoretic.  HENT:     Head: Normocephalic and atraumatic.     Nose: No congestion or rhinorrhea.     Mouth/Throat:     Mouth: Mucous membranes are moist.     Pharynx: No oropharyngeal exudate or posterior oropharyngeal erythema.  Eyes:     Extraocular Movements: Extraocular movements intact.     Conjunctiva/sclera: Conjunctivae normal.     Pupils: Pupils are equal, round, and reactive to light.  Cardiovascular:     Rate and Rhythm: Normal  rate and regular rhythm.     Heart sounds: No murmur heard. Pulmonary:     Effort: Pulmonary effort is normal. No respiratory distress.     Breath sounds: Normal breath sounds. No wheezing, rhonchi or rales.  Chest:     Chest wall: No tenderness.  Abdominal:     General: Abdomen is flat.     Palpations: Abdomen is soft.     Tenderness: There is no abdominal tenderness. There is no right CVA tenderness, left CVA tenderness, guarding or rebound.  Musculoskeletal:        General: No swelling or tenderness.     Cervical back: Neck supple. No tenderness.     Right lower leg: No edema.     Left lower leg: No edema.  Skin:    General: Skin is warm and dry.     Capillary Refill: Capillary refill takes less than 2 seconds.     Findings: No erythema or rash.  Neurological:     General: No focal deficit present.     Mental Status: He is alert.     Sensory: No sensory deficit.     Motor: No weakness.  Psychiatric:        Mood and Affect: Mood normal.    ED Results / Procedures / Treatments   Labs (all labs  ordered are listed, but only abnormal results are displayed) Labs Reviewed  CBC WITH DIFFERENTIAL/PLATELET - Abnormal; Notable for the following components:      Result Value   RBC 3.99 (*)    Hemoglobin 11.9 (*)    HCT 37.2 (*)    All other components within normal limits  COMPREHENSIVE METABOLIC PANEL - Abnormal; Notable for the following components:   Glucose, Bld 357 (*)    BUN 35 (*)    Creatinine, Ser 2.79 (*)    Calcium 8.6 (*)    Albumin 2.9 (*)    GFR, Estimated 26 (*)    All other components within normal limits    EKG None  Radiology CT HEAD WO CONTRAST (5MM)  Result Date: 09/10/2021 CLINICAL DATA:  Head trauma EXAM: CT HEAD WITHOUT CONTRAST TECHNIQUE: Contiguous axial images were obtained from the base of the skull through the vertex without intravenous contrast. RADIATION DOSE REDUCTION: This exam was performed according to the departmental dose-optimization program which includes automated exposure control, adjustment of the mA and/or kV according to patient size and/or use of iterative reconstruction technique. COMPARISON:  CT brain 08/22/2021, MRI 04/25/2020 FINDINGS: Brain: No acute territorial infarction, hemorrhage or intracranial mass. Stable ventricle size. Small chronic infarct in the right pons. Vascular: No hyperdense vessels.  No unexpected calcification Skull: Normal. Negative for fracture or focal lesion. Sinuses/Orbits: Mucosal thickening in the sinuses. Old appearing fracture deformities of the medial walls of the orbits. Other: None IMPRESSION: No CT evidence for acute intracranial abnormality Electronically Signed   By: Donavan Foil M.D.   On: 09/10/2021 22:00    Procedures Procedures    Medications Ordered in ED Medications - No data to display  ED Course/ Medical Decision Making/ A&P                           Medical Decision Making Amount and/or Complexity of Data Reviewed Radiology: ordered.    ANGELES PAOLUCCI is a 56 y.o. male with a past  medical history significant for bilateral leg amputations, HIV/AIDS, diabetes, CKD, CAD with previous MI, and GERD who presents with  multiple falls.  According to patient, he has had several falls this week and is also had worsening diarrhea.  He reports he is feeling dehydrated.  He says that today he had his left prosthesis get stuck and caused him to fall hitting his head on some rocks.  He denies loss of consciousness but is having moderate headache.  Denies any neck pain, back pain, chest pain, or abdominal pain.  He has a small scrape on his left thigh but otherwise has no pains in the leg.  No laceration seen.    On exam, lungs were clear and chest was nontender.  Abdomen was nontender.  Back was nontender.  He has a small abrasion on his left anterior leg that is nontender.  No bleeding.  Patient moving all extremities.  Mucous membranes are moist.  Patient otherwise resting comfortably.  Given the patient's report of head pain after hitting his head on some rocks, we will get a CT head.  He does not any focal neurologic deficits on initial exam.  Suspect he may have a degree of mild dehydration related to the week of diarrhea so we will get some screening labs.  He is able to eat and drink however.  Patient is also concerned about his multiple falls and overall fatigue.  He is concerned about him continuing to live out of his car and he wants to be placed in a nursing facility.  We discussed that that may not be something were able to do today however we will make sure there is no acute traumatic injuries.  We will also likely discuss with case management to see what his options may be.  10:44 PM CT head returned reassuring.  No evidence of acute fracture.  Blood work also reassuring.  Given the patient's report that he keeps falling and is not feel safe ambulating at this time with his bilateral prosthesis, will consult case management/social work to help determine what needs to be done to  ensure his safety.  Unfortunately, there is not a case management or social work available at this facility at this time.  Thus, we will place a physical therapy consultation for them to evaluate and treat to help determine his level of need in regards to ambulation at this time and this will also help case management tomorrow to decide if he is able to be placed somewhere versus ensure safe discharge.  Patient will board until tomorrow for the daytime case management team to evaluate.  We will order a medication reconciliation so I can order his home medications for him overnight.         Final Clinical Impression(s) / ED Diagnoses Final diagnoses:  Multiple falls    Clinical Impression: 1. Multiple falls     Disposition: Await PT and case management/social work to see him in the morning given his multiple falls and mobility limitations  This note was prepared with assistance of Systems analyst. Occasional wrong-word or sound-a-like substitutions may have occurred due to the inherent limitations of voice recognition software.     Natesha Hassey, Gwenyth Allegra, MD 09/10/21 2303

## 2021-09-10 NOTE — ED Notes (Signed)
Pt had large BM in bed. Pt cleaned. Full linen change. Pt is resting comfortably.

## 2021-09-10 NOTE — ED Notes (Signed)
Attempted to call PT with no answer.

## 2021-09-10 NOTE — ED Provider Triage Note (Signed)
Emergency Medicine Provider Triage Evaluation Note  Keith [name] , a 56 y.o. male  was evaluated in triage.  Pt complains of prosthesis failing causing him to fall down   Review of Systems  Positive: weakness  Negative: fever   Physical Exam  BP (!) 148/100 (BP Location: Right Arm)   Pulse 70   Temp 98 F (36.7 C) (Oral)   Resp 18   SpO2 97%  Gen:   Awake, no distress   Resp:  Normal effort   MSK:    Other:    Medical Decision Making  Medically screening exam initiated at 1:38 PM.  Appropriate orders placed.  Keith [name] was informed that the remainder of the evaluation will be completed by another provider, this initial triage assessment does not replace that evaluation, and the importance of remaining in the ED until their evaluation is complete.     Fransico Meadow, [place] 09/10/21 1341

## 2021-09-10 NOTE — ED Triage Notes (Signed)
Patient BIB [facility] from home after prosthetic foot got caught in his driveway, causing him to fall, history of bilateral AKA. No LOC, no anticoagulants. Patient is alert, oriented, and in no apparent distress at this time.

## 2021-09-11 ENCOUNTER — Ambulatory Visit: Payer: Medicare Other | Admitting: Rehabilitation

## 2021-09-11 DIAGNOSIS — R296 Repeated falls: Secondary | ICD-10-CM | POA: Diagnosis not present

## 2021-09-11 LAB — CBG MONITORING, ED: Glucose-Capillary: 363 mg/dL — ABNORMAL HIGH (ref 70–99)

## 2021-09-11 MED ORDER — ICOSAPENT ETHYL 1 G PO CAPS
2.0000 g | ORAL_CAPSULE | Freq: Two times a day (BID) | ORAL | Status: DC
Start: 2021-09-12 — End: 2021-09-15
  Administered 2021-09-12 – 2021-09-14 (×6): 2 g via ORAL
  Filled 2021-09-11 (×8): qty 2

## 2021-09-11 MED ORDER — CARVEDILOL 3.125 MG PO TABS
3.1250 mg | ORAL_TABLET | Freq: Two times a day (BID) | ORAL | Status: DC
  Administered 2021-09-11 – 2021-09-14 (×7): 3.125 mg via ORAL
  Filled 2021-09-11 (×7): qty 1

## 2021-09-11 MED ORDER — DORAVIRINE 100 MG PO TABS
100.0000 mg | ORAL_TABLET | Freq: Every day | ORAL | Status: DC
  Administered 2021-09-12 – 2021-09-14 (×3): 100 mg via ORAL
  Filled 2021-09-11 (×5): qty 1

## 2021-09-11 MED ORDER — LISINOPRIL 10 MG PO TABS
10.0000 mg | ORAL_TABLET | Freq: Every day | ORAL | Status: DC
  Administered 2021-09-12 – 2021-09-14 (×3): 10 mg via ORAL
  Filled 2021-09-11 (×3): qty 1

## 2021-09-11 MED ORDER — ALLOPURINOL 100 MG PO TABS
100.0000 mg | ORAL_TABLET | Freq: Every day | ORAL | Status: DC
  Administered 2021-09-12 – 2021-09-14 (×3): 100 mg via ORAL
  Filled 2021-09-11 (×3): qty 1

## 2021-09-11 MED ORDER — OXYBUTYNIN CHLORIDE 5 MG PO TABS
2.5000 mg | ORAL_TABLET | Freq: Two times a day (BID) | ORAL | Status: DC
  Administered 2021-09-12 – 2021-09-14 (×5): 2.5 mg via ORAL
  Filled 2021-09-11 (×7): qty 0.5

## 2021-09-11 MED ORDER — INSULIN GLARGINE-YFGN 100 UNIT/ML ~~LOC~~ SOLN
10.0000 [IU] | Freq: Every day | SUBCUTANEOUS | Status: DC
  Administered 2021-09-11 – 2021-09-14 (×4): 10 [IU] via SUBCUTANEOUS
  Filled 2021-09-11 (×5): qty 0.1

## 2021-09-11 MED ORDER — INSULIN ASPART 100 UNIT/ML FLEXPEN
10.0000 [IU] | PEN_INJECTOR | Freq: Three times a day (TID) | SUBCUTANEOUS | Status: DC
  Filled 2021-09-11: qty 3

## 2021-09-11 MED ORDER — INSULIN ASPART 100 UNIT/ML IJ SOLN
10.0000 [IU] | Freq: Three times a day (TID) | INTRAMUSCULAR | Status: DC
  Administered 2021-09-12 – 2021-09-14 (×8): 10 [IU] via SUBCUTANEOUS

## 2021-09-11 MED ORDER — GABAPENTIN 300 MG PO CAPS
300.0000 mg | ORAL_CAPSULE | Freq: Three times a day (TID) | ORAL | Status: DC
  Administered 2021-09-11 – 2021-09-14 (×9): 300 mg via ORAL
  Filled 2021-09-11 (×9): qty 1

## 2021-09-11 MED ORDER — AMLODIPINE BESYLATE 5 MG PO TABS
10.0000 mg | ORAL_TABLET | Freq: Every day | ORAL | Status: DC
  Administered 2021-09-12 – 2021-09-14 (×3): 10 mg via ORAL
  Filled 2021-09-11 (×3): qty 2

## 2021-09-11 MED ORDER — DOLUTEGRAVIR-LAMIVUDINE 50-300 MG PO TABS
1.0000 | ORAL_TABLET | Freq: Every day | ORAL | Status: DC
Start: 2021-09-12 — End: 2021-09-15
  Administered 2021-09-12 – 2021-09-14 (×3): 1 via ORAL
  Filled 2021-09-11 (×5): qty 1

## 2021-09-11 MED ORDER — METFORMIN HCL 500 MG PO TABS
1000.0000 mg | ORAL_TABLET | Freq: Two times a day (BID) | ORAL | Status: DC
  Administered 2021-09-12 (×2): 1000 mg via ORAL
  Filled 2021-09-11 (×2): qty 2

## 2021-09-11 MED ORDER — CEPHALEXIN 250 MG PO CAPS
500.0000 mg | ORAL_CAPSULE | Freq: Two times a day (BID) | ORAL | Status: DC
  Administered 2021-09-11 – 2021-09-14 (×7): 500 mg via ORAL
  Filled 2021-09-11 (×7): qty 2

## 2021-09-11 NOTE — Evaluation (Signed)
Physical Therapy Evaluation Patient Details Name: Keith [name] MRN: [id number] DOB: 1965-06-04 Today's Date: 09/11/2021  History of Present Illness  56 yo male with onset of fall in store per his report was admitted on 6/5 and now referred to PT.  Has no findings with imaging of head after striking back of his head, minor L thigh injury.  PMHx:  R AK, L AK, AIDS, CVA, CAD, MI, HTN, CKD, HA, gout  Clinical Impression  Pt was seen for mobility, after sustaining a fall in a store.  Pt was able to don RLE with help, but use of LLE was limited by his contaminated sleeve and sock for LLE prosthetic.  Per pt has been in his car recently, unable to have access to the resources of his previous situation with family.  Has apparently a different relationship with family, and would be better served by his use of SNF for recovery of mobility.  Pt is able to stand and shift wgt on RLE, but will not be able to walk on LLE until care of the prosthetic equipment is done.  Follow up with acute PT needs, with pt describing a need for wheelchair now as well.  Focus on standing and transfers to chair for basic mobility needs toward dc to rehab if possible.       Recommendations for follow up therapy are one component of a multi-disciplinary discharge planning process, led by the attending physician.  Recommendations may be updated based on patient status, additional functional criteria and insurance authorization.  Follow Up Recommendations Skilled nursing-short term rehab (<3 hours/day)    Assistance Recommended at Discharge Frequent or constant Supervision/Assistance  Patient can return home with the following  A little help with bathing/dressing/bathroom;A lot of help with walking and/or transfers;Assistance with cooking/housework;Assist for transportation;Help with stairs or ramp for entrance    Equipment Recommendations Wheelchair (measurements PT);Wheelchair cushion (measurements PT)  Recommendations for  Other Services       Functional Status Assessment Patient has had a recent decline in their functional status and demonstrates the ability to make significant improvements in function in a reasonable and predictable amount of time.     Precautions / Restrictions Precautions Precautions: Fall Precaution Comments: incontinence Required Braces or Orthoses: Other Brace Other Brace: prosthetics for BLE Restrictions Weight Bearing Restrictions: No      Mobility  Bed Mobility Overal bed mobility: Needs Assistance Bed Mobility: Supine to Sit, Sit to Supine     Supine to sit: Mod assist Sit to supine: Min assist        Transfers Overall transfer level: Needs assistance Equipment used: Rolling walker (2 wheels) Transfers: Sit to/from Stand Sit to Stand: Min assist           General transfer comment: min assist to control standing balance without LLE    Ambulation/Gait               General Gait Details: deferred due to his sleeve and sock for LLE being soiled with urine and feces  Stairs            Wheelchair Mobility    Modified Rankin (Stroke Patients Only)       Balance Overall balance assessment: Needs assistance Sitting-balance support: Single extremity supported Sitting balance-Leahy Scale: Good     Standing balance support: Bilateral upper extremity supported Standing balance-Leahy Scale: Poor Standing balance comment: requiring walker due to RLE only  Pertinent Vitals/Pain Pain Assessment Pain Assessment: No/denies pain    Home Living Family/patient expects to be discharged to:: Unsure                   Additional Comments: pt previously lived with family and per pt is now homeless, living in his car    Prior Function Prior Level of Function : Independent/Modified Independent             Mobility Comments: Uses rollator and bilateral prosthetic for short distance ambulation. Uses  WC for further mobility ADLs Comments: Able to bathe/dress independently     Hand Dominance        Extremity/Trunk Assessment   Upper Extremity Assessment Upper Extremity Assessment: Overall WFL for tasks assessed    Lower Extremity Assessment Lower Extremity Assessment: RLE deficits/detail;LLE deficits/detail RLE Deficits / Details: R BKA LLE Deficits / Details: L AKA    Cervical / Trunk Assessment Cervical / Trunk Assessment: Normal  Communication   Communication: No difficulties  Cognition Arousal/Alertness: Awake/alert Behavior During Therapy: WFL for tasks assessed/performed Overall Cognitive Status: No family/caregiver present to determine baseline cognitive functioning                                 General Comments: cognitive issues of answering questions about PLOF        General Comments General comments (skin integrity, edema, etc.): pt has sleeves and socks for BLE prosthetics with urine and feces contaminating them, per pt is living out of his car and does not have access to basic hygiene needs    Exercises     Assessment/Plan    PT Assessment Patient needs continued PT services  PT Problem List Decreased strength;Decreased activity tolerance;Decreased balance;Decreased cognition;Decreased knowledge of use of DME;Decreased safety awareness;Decreased skin integrity;Obesity       PT Treatment Interventions DME instruction;Gait training;Functional mobility training;Therapeutic activities;Therapeutic exercise;Balance training;Neuromuscular re-education;Patient/family education    PT Goals (Current goals can be found in the Care Plan section)  Acute Rehab PT Goals Patient Stated Goal: to get rehab help PT Goal Formulation: With patient Time For Goal Achievement: 09/25/21 Potential to Achieve Goals: Good    Frequency Min 3X/week     Co-evaluation               AM-PAC PT "6 Clicks" Mobility  Outcome Measure Help needed turning  from your back to your side while in a flat bed without using bedrails?: None Help needed moving from lying on your back to sitting on the side of a flat bed without using bedrails?: A Lot Help needed moving to and from a bed to a chair (including a wheelchair)?: A Lot Help needed standing up from a chair using your arms (e.g., wheelchair or bedside chair)?: A Little Help needed to walk in hospital room?: A Little Help needed climbing 3-5 steps with a railing? : Total 6 Click Score: 15    End of Session Equipment Utilized During Treatment: Gait belt Activity Tolerance: Patient tolerated treatment well Patient left: in bed;with nursing/sitter in room Nurse Communication: Mobility status PT Visit Diagnosis: Unsteadiness on feet (R26.81);Muscle weakness (generalized) (M62.81);Difficulty in walking, not elsewhere classified (R26.2);History of falling (Z91.81);Repeated falls (R29.6)    Time: 1443-1540 PT Time Calculation (min) (ACUTE ONLY): 29 min   Charges:   PT Evaluation $PT Eval Moderate Complexity: 1 Mod PT Treatments $Therapeutic Activity: 8-22 mins       Rod Holler  Bronson Curb 09/11/2021, 11:27 AM  Mee Hives, PT PhD Acute Rehab Dept. Number: [place] [facility] [place]

## 2021-09-11 NOTE — Progress Notes (Signed)
PT Cancellation Note  Patient Details Name: Keith [name] MRN: [id number] DOB: January 20, 1966   Cancelled Treatment:    Reason Eval/Treat Not Completed: Other (comment).  CNA arrived to get vitals, will retry as pt can allow.   Ramond Dial 09/11/2021, 10:43 AM  Mee Hives, PT PhD Acute Rehab Dept. Number: [place] [facility] [place]

## 2021-09-11 NOTE — Progress Notes (Signed)
SNF bed search started.  

## 2021-09-11 NOTE — NC FL2 (Signed)
[place] LEVEL OF CARE SCREENING TOOL     IDENTIFICATION  Patient Name: Keith [name] Birthdate: Aug 04, 1965 Sex: male Admission Date (Current Location): 09/10/2021  [facility] and [place] Number:  Herbalist and Address:  [facility], [place] [address], [place], [place] [address]      Provider Number: [id number]  Attending Physician Name and Address:  Default, Provider, MD  Relative Name and Phone Number:  Hulen, Mandler (Mother)   [phone]    Current Level of Care: Hospital Recommended Level of Care: [place] Prior Approval Number:    Date Approved/Denied:   PASRR Number: [id number]  Discharge Plan: SNF    Current Diagnoses: Patient Active Problem List   Diagnosis Date Noted   Headache 08/22/2021   Anxiety 02/13/2021   Healthcare maintenance 02/13/2021   History of left above knee amputation ([place]) 02/07/2021   Wound dehiscence 01/03/2021   Coagulopathy ([place]) 12/22/2020   CAD (coronary artery disease) 12/22/2020   Obesity 12/22/2020   Gross hematuria 12/20/2020   ABLA (acute blood loss anemia) 12/20/2020   Dehiscence of amputation stump (HCC)    Hx of BKA, left ([place])    Cellulitis of left foot    Acute osteomyelitis of left calcaneus ([place])    Ulcer of heel due to diabetes mellitus ([place])    Normochromic normocytic anemia    Cellulitis 10/18/2020   Atypical chest pain 70/62/3762   Acute metabolic encephalopathy 83/15/1761   Hyperglycemia due to diabetes mellitus ([place]) 08/29/2020   Hyperglycemia 08/29/2020   Acute cerebrovascular accident (CVA) due to ischemia ([place]) 04/28/2020   Type 2 diabetes mellitus with hyperlipidemia (HCC)    PAD (peripheral artery disease) ([place])    Hypoglycemia 04/25/2020   Acute ST elevation myocardial infarction (STEMI) due to occlusion of circumflex coronary artery ([place]) 03/22/2020   Uncontrolled type 2 diabetes mellitus with hyperglycemia ([place]) 01/14/2019   Elevated glucose  01/14/2019   Hx of BKA, right ([place]) 01/14/2019   Pressure injury of skin 11/11/2018   STEMI (ST elevation myocardial infarction) ([place]) 11/10/2018   CKD (chronic kidney disease) stage 4, GFR 15-29 ml/min (HCC) 11/10/2018   Amputation stump infection ([place]) 10/28/2018   Type II diabetes mellitus with renal manifestations ([place]) 08/07/2018   Uncontrolled type 2 diabetes mellitus with hyperosmolar nonketotic hyperglycemia ([place]) 08/07/2018   Non-pressure chronic ulcer of right calf limited to breakdown of skin ([place]) 07/06/2018   Type 2 diabetes mellitus without complication, without long-term current use of insulin ([place]) 06/15/2018   Chronic low back pain without sciatica 06/15/2018   Idiopathic chronic venous hypertension of left lower extremity with ulcer and inflammation (HCC)    Venous stasis ulcer of left calf limited to breakdown of skin without varicose veins ([place]) 04/23/2018   Acquired absence of right leg below knee ([place]) 06/13/2017   Acute kidney injury superimposed on CKD ([place]) 03/15/2017   Diabetic polyneuropathy associated with type 2 diabetes mellitus ([place]) 01/23/2017   AKI (acute kidney injury) ([place]) 09/28/2016   Nausea vomiting and diarrhea 09/28/2016   Onychomycosis of multiple toenails with type 2 diabetes mellitus ([place]) 08/29/2015   MRSA carrier 04/20/2014   Penile wart 02/22/2014   HIV disease ([place])    Insulin-requiring or dependent type II diabetes mellitus ([place]) 02/04/2014   Dental anomaly 11/20/2012   Arthritis of right knee 02/23/2012   Hyperlipidemia 11/10/2011   Hyponatremia 11/10/2011   Chronic pain 08/07/2011   Meralgia paraesthetica 04/23/2011   ERECTILE DYSFUNCTION 08/22/2008  HTN (hypertension) 05/19/2008    Orientation RESPIRATION BLADDER Height & Weight     Self, Time, Situation, Place  Normal Continent Weight: 265 lbs  Height:  5'9   BEHAVIORAL SYMPTOMS/MOOD NEUROLOGICAL BOWEL NUTRITION STATUS      Continent Diet (Low Carb)  AMBULATORY STATUS  COMMUNICATION OF NEEDS Skin   Limited Assist Verbally Normal                       Personal Care Assistance Level of Assistance  Bathing, Feeding, Dressing Bathing Assistance: Limited assistance Feeding assistance: Independent Dressing Assistance: Limited assistance     Functional Limitations Info  Sight, Hearing, Speech Sight Info: Adequate Hearing Info: Adequate Speech Info: Adequate    SPECIAL CARE FACTORS FREQUENCY                       Contractures Contractures Info: Not present    Additional Factors Info  Code Status, Allergies, Insulin Sliding Scale Code Status Info: Full Allergies Info: Elemental Sulfur, Sulfa Antibiotics   Insulin Sliding Scale Info: See medication list. Patient states he takes 10 units at night.       Current Medications (09/11/2021):  This is the current hospital active medication list No current facility-administered medications for this encounter.   Current Outpatient Medications  Medication Sig Dispense Refill   allopurinol (ZYLOPRIM) 100 MG tablet Take 1 tablet (100 mg total) by mouth daily. 30 tablet 1   amLODipine (NORVASC) 10 MG tablet Take 10 mg by mouth daily.     atorvastatin (LIPITOR) 80 MG tablet Take 1 tablet (80 mg total) by mouth daily. 30 tablet 1   carvedilol (COREG) 3.125 MG tablet Take 1 tablet (3.125 mg total) by mouth 2 (two) times daily. 60 tablet 1   dapsone 100 MG tablet TAKE 1 TABLET (100 MG TOTAL) BY MOUTH DAILY. 30 tablet 5   dolutegravir-lamiVUDine (DOVATO) 50-300 MG tablet Take 1 tablet by mouth daily. 30 tablet 5   doravirine (PIFELTRO) 100 MG TABS tablet Take 1 tablet (100 mg total) by mouth daily. 30 tablet 5   finasteride (PROSCAR) 5 MG tablet Take 5 mg by mouth daily.     gabapentin (NEURONTIN) 300 MG capsule Take 300 mg by mouth 3 (three) times daily.     icosapent Ethyl (VASCEPA) 1 g capsule TAKE 2 CAPSULES (2 G TOTAL) BY MOUTH TWO TIMES DAILY. (Patient taking differently: Take 2 g by mouth 2 (two)  times daily.) 120 capsule 3   lisinopril (ZESTRIL) 10 MG tablet Take 10 mg by mouth daily.     metFORMIN (GLUCOPHAGE) 1000 MG tablet Take 1,000 mg by mouth 2 (two) times daily with a meal.     nitroGLYCERIN (NITROSTAT) 0.4 MG SL tablet Place 0.4 mg under the tongue every 5 (five) minutes as needed for chest pain.     oxybutynin (DITROPAN) 5 MG tablet Take 1/2 tablet (2.5 mg total) by mouth 2 (two) times daily. 30 tablet 1   sodium bicarbonate 650 MG tablet Take 1 tablet (650 mg total) by mouth 2 (two) times daily. 60 tablet 0   blood glucose meter kit and supplies Dispense based on patient and insurance preference. Use up to four times daily as directed. (FOR ICD-10 E10.9, E11.9). 1 each 0   cephALEXin (KEFLEX) 500 MG capsule Take 500 mg by mouth 2 (two) times daily. For 6 days (Patient not taking: Reported on 09/11/2021)     escitalopram (LEXAPRO) 20 MG tablet Take  1 tablet (20 mg total) by mouth daily as needed. (Patient not taking: Reported on 09/11/2021) 30 tablet 3   ferrous sulfate 325 (65 FE) MG tablet Take 1 tablet (325 mg total) by mouth every Monday, Wednesday, and Friday at 6 PM. Do not take with other medicines. (Patient not taking: Reported on 09/11/2021) 12 tablet 0   gabapentin (NEURONTIN) 100 MG capsule Take 1 capsule (100 mg total) by mouth 3 (three) times daily. (Patient not taking: Reported on 08/23/2021) 90 capsule 1   glucose blood (COOL BLOOD GLUCOSE TEST STRIPS) test strip Use as instructed 100 each 0   insulin aspart (NOVOLOG) 100 UNIT/ML FlexPen Inject 10 Units into the skin 3 (three) times daily with meals. (Patient not taking: Reported on 09/11/2021)     insulin glargine (LANTUS) 100 UNIT/ML Solostar Pen Inject 10 Units into the skin daily. (Patient not taking: Reported on 09/11/2021)     Lancets (ONETOUCH ULTRASOFT) lancets Use as instructed 100 each 12   oxyCODONE-acetaminophen (PERCOCET) 10-325 MG tablet Take 1 tablet by mouth every 4 (four) hours as needed for pain. (Patient taking  differently: Take 1 tablet by mouth every 4 (four) hours as needed (severe pain).) 30 tablet 0   pantoprazole (PROTONIX) 40 MG tablet Take 1 tablet (40 mg total) by mouth daily. (Patient not taking: Reported on 09/11/2021) 30 tablet 0     Discharge Medications: Please see discharge summary for a list of discharge medications.  Relevant Imaging Results:  Relevant Lab Results:   Additional Information SS#: [id number]. JANSSEN COVID-19 VACCINE 11/12/2019 only  [name], LCSWA

## 2021-09-12 DIAGNOSIS — R296 Repeated falls: Secondary | ICD-10-CM | POA: Diagnosis not present

## 2021-09-12 LAB — CBG MONITORING, ED
Glucose-Capillary: 376 mg/dL — ABNORMAL HIGH (ref 70–99)
Glucose-Capillary: 203 mg/dL — ABNORMAL HIGH (ref 70–99)
Glucose-Capillary: 171 mg/dL — ABNORMAL HIGH (ref 70–99)

## 2021-09-12 NOTE — Progress Notes (Signed)
Inpatient Diabetes Program Recommendations  AACE/ADA: New Consensus Statement on Inpatient Glycemic Control (2015)  Target Ranges:  Prepandial:   less than 140 mg/dL      Peak postprandial:   less than 180 mg/dL (1-2 hours)      Critically ill patients:  140 - 180 mg/dL   Lab Results  Component Value Date   GLUCAP 363 (H) 09/11/2021   HGBA1C 6.0 (H) 08/17/2021    Review of Glycemic Control  Latest Reference Range & Units 09/11/21 22:09  Glucose-Capillary 70 - 99 mg/dL 363 (H)   Diabetes history: DM 2 Outpatient Diabetes medications: Novolog 10 units tid and lantus 10 units in the past but only on metformin 1000 mg bid Current orders for Inpatient glycemic control:  Metformin 1000 mg bid Semglee 10 units qhs Novolog 10 units tid  A1c 6% on 08/17/21  Inpatient Diabetes Program Recommendations:    -  Please order CBG checks while here and we are giving insulin doses in the hospital. Glucose check last night was in the 360's.  Thanks,  Tama Headings RN, MSN, BC-ADM Inpatient Diabetes Coordinator Team Pager [phone] (8a-5p)

## 2021-09-12 NOTE — ED Notes (Signed)
Pt received lunch tray 

## 2021-09-12 NOTE — ED Provider Notes (Signed)
Emergency Medicine Observation Re-evaluation Note  Keith [name] is a 56 y.o. male, seen on rounds today.  Pt initially presented to the ED for complaints of Fall Currently, the patient is resting  Physical Exam  BP 133/79 (BP Location: Right Arm)   Pulse 75   Temp 98 F (36.7 C) (Oral)   Resp 17   SpO2 96%  Physical Exam General: NAD Cardiac: Regular HR Lungs: No respiratory distress Psych: Stable  ED Course / MDM  EKG:   I have reviewed the labs performed to date as well as medications administered while in observation.  Recent changes in the last 24 hours include PT evaluation recommending SNF  Plan  Current plan is for SNF placement  Keith [name] is not under involuntary commitment.     Wyvonnia Dusky, MD 09/12/21 [phone]

## 2021-09-12 NOTE — Progress Notes (Signed)
Patient choose [facility]. Insurance authorization pending.

## 2021-09-12 NOTE — ED Notes (Signed)
Pt takling on phone and watching tv.

## 2021-09-13 ENCOUNTER — Other Ambulatory Visit: Payer: Self-pay | Admitting: Infectious Diseases

## 2021-09-13 DIAGNOSIS — R296 Repeated falls: Secondary | ICD-10-CM | POA: Diagnosis not present

## 2021-09-13 DIAGNOSIS — R112 Nausea with vomiting, unspecified: Secondary | ICD-10-CM

## 2021-09-13 LAB — CBG MONITORING, ED
Glucose-Capillary: 151 mg/dL — ABNORMAL HIGH (ref 70–99)
Glucose-Capillary: 181 mg/dL — ABNORMAL HIGH (ref 70–99)
Glucose-Capillary: 199 mg/dL — ABNORMAL HIGH (ref 70–99)
Glucose-Capillary: 154 mg/dL — ABNORMAL HIGH (ref 70–99)

## 2021-09-13 IMAGING — CR DG FOOT COMPLETE 3+V*L*
3 series · 3 of 3 positions shown · non-contrast
Comparison: None.

CLINICAL DATA: Pt c/o left heel wound x several weeks; pt has been
seen at the wound center. Dr. Jl Billiot the patient to the ED
tonight. Hx of DM. No hx of prior injuries or surgeries. Patient
could not internally rotate his left leg due t.*comment was
truncated*Ulcer on the calcaneus.

EXAM:
LEFT FOOT - COMPLETE 3+ VIEW

[foot ap]
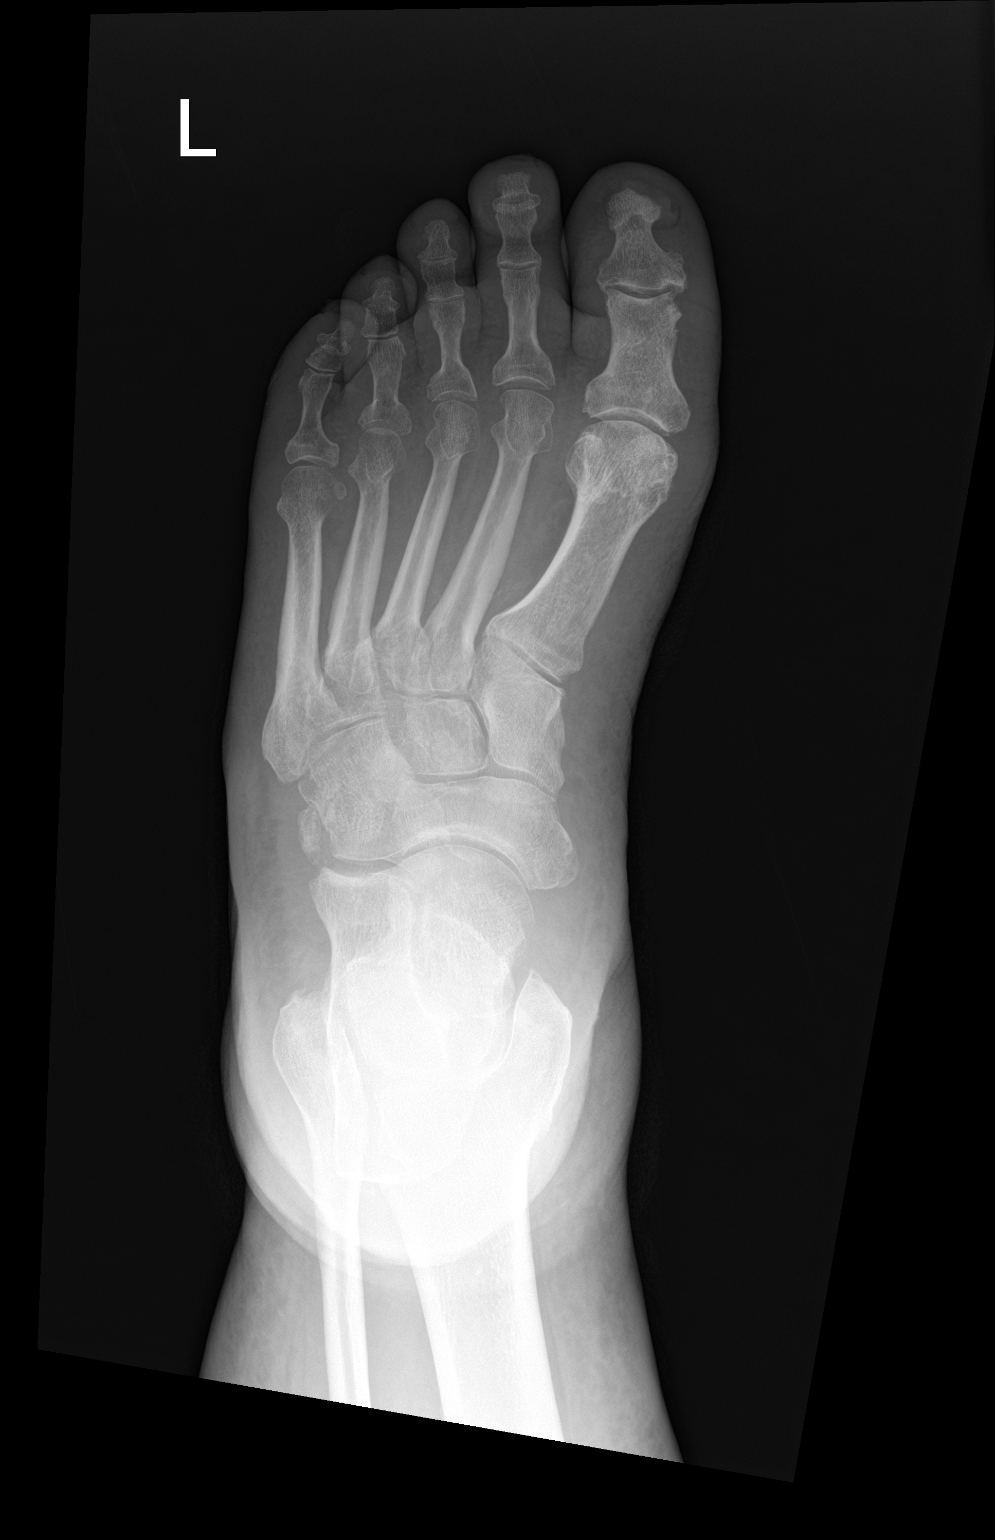

[foot obl]
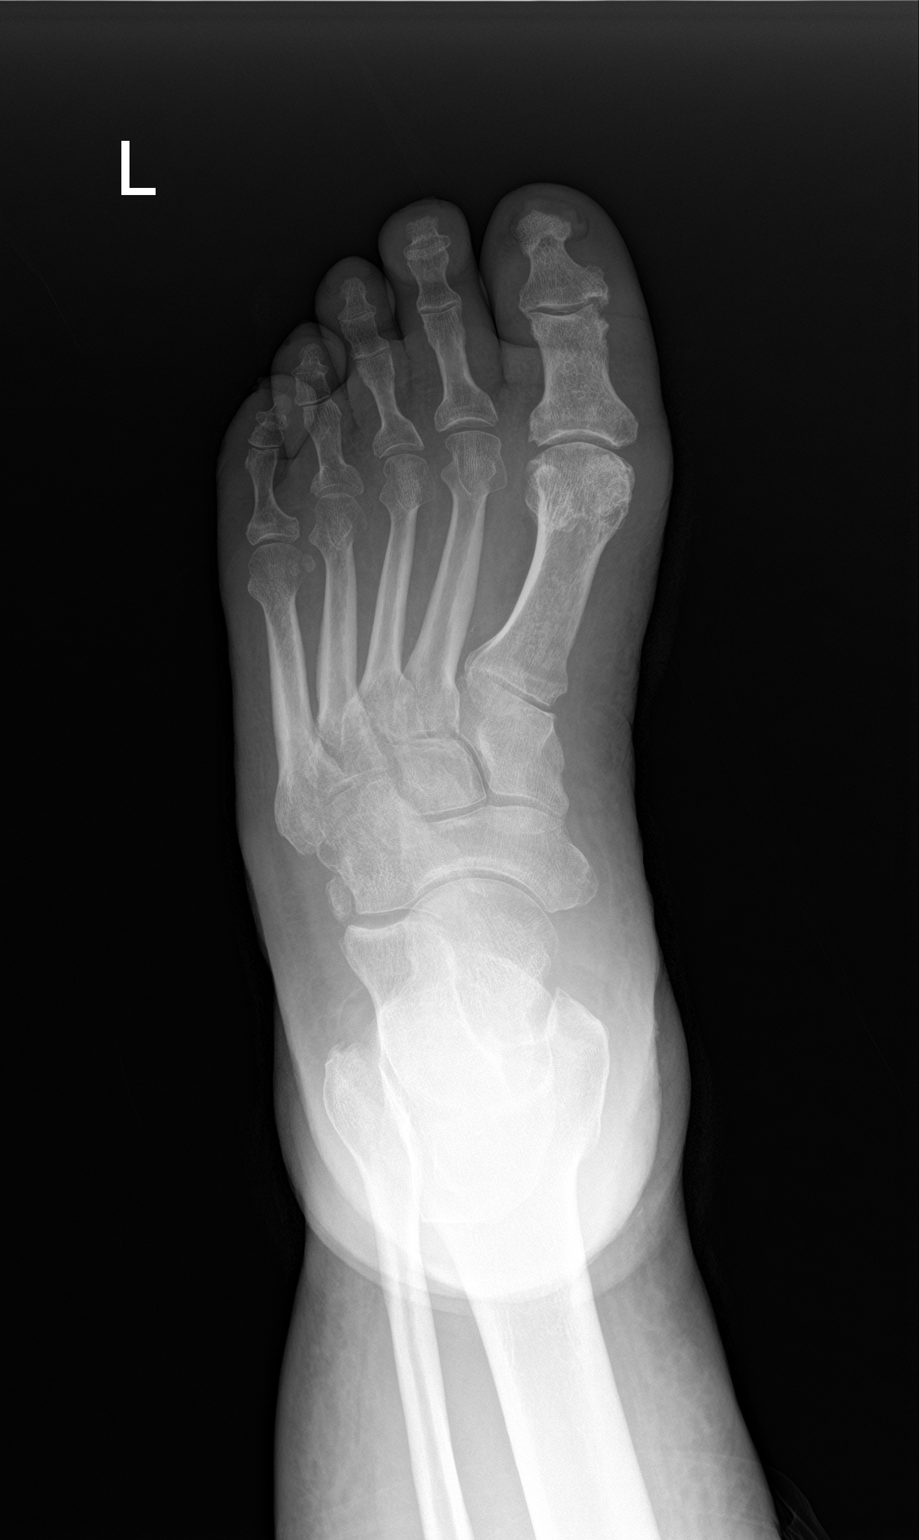

[foot lat]
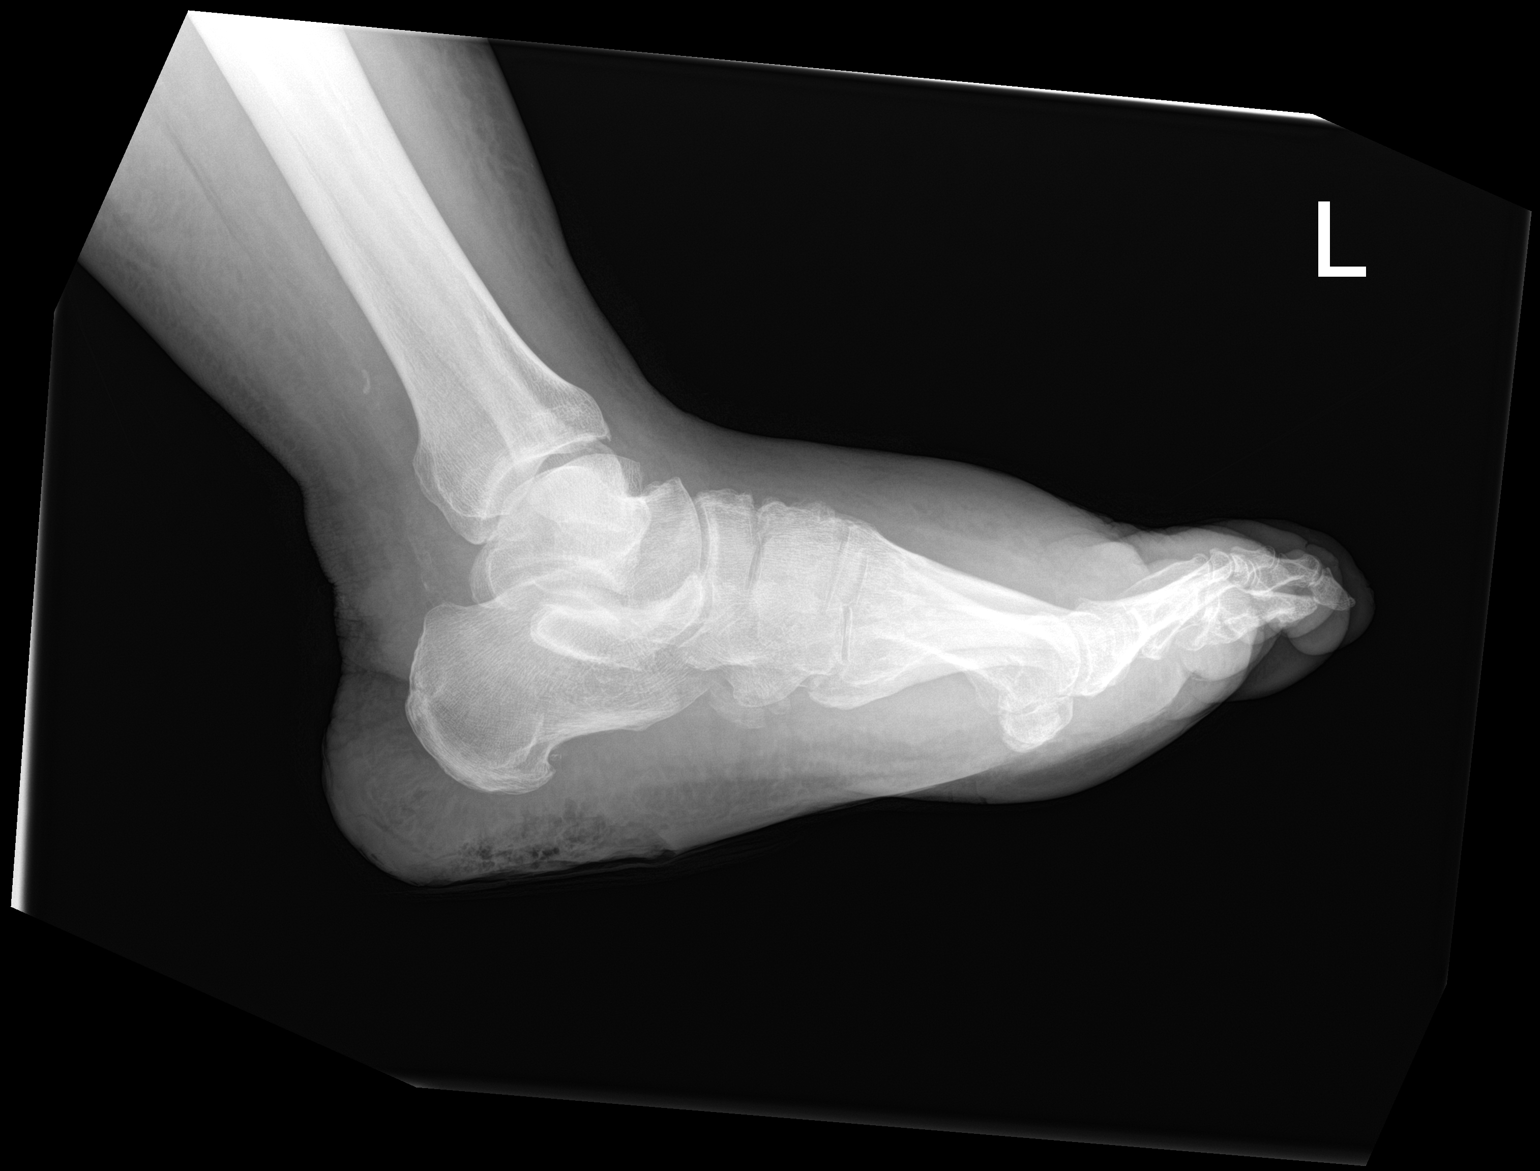

[3 of 3 positions shown; findings below may reference images not displayed]

FINDINGS: There is soft tissue ulceration along the plantar surface of the
foot adjacent to the calcaneus. There is gas within this ulceration
which measures approximately 1 cm in depth. No evidence of erosion
of the adjacent calcaneus.

There is soft tissue swelling in the forefoot on the dorsal surface.

In comparison to radiograph 10/07/2020 the soft tissue ulceration is
increased in size measuring 5 cm x 1 cm compared to 2.3 by 1.1 cm.
IMPRESSION: 1. Increase in volume of the soft tissue ulceration along the
plantar surface of the calcaneus.
2. No evidence of osteomyelitis.
3. Soft tissue swelling of the forefoot.

## 2021-09-13 NOTE — Telephone Encounter (Signed)
Please advise if okay to refill. 

## 2021-09-13 NOTE — Progress Notes (Signed)
[facility] contacted CSW who stated they are not sure they will approve patient for SNF placement. CSW was told patients information will be given to the medical director and they will call CSW if a peer to peer is recommended.

## 2021-09-13 NOTE — Progress Notes (Signed)
[facility] stated they could take patient today but then messaged CSW back and stated they will have to take him tomorrow morning.

## 2021-09-13 NOTE — Progress Notes (Signed)
Physical Therapy Treatment Patient Details Name: Keith [name] MRN: [id number] DOB: [birth year] Today's Date: 09/13/2021   History of Present Illness 56 yo male presenting following fall on 6/5. Has no findings with imaging of head after striking back of his head, minor L thigh injury.  PMHx:  R BKA, L AKA, HIV, CVA, CAD, MI, HTN, CKD, HA, gout    PT Comments    Pt progressing towards goals. Able to stand X2 for clean up following BM and able to take a couple hop steps at EOB. Increased unsteadiness and required mod A for steadying. Reviewed HEP. Also gave wash bin at end of session to wash prosthetic sleeves, so LLE prosthetic could be used during next session. Current recommendations for SNF appropriate given current deficits. Will continue to follow acutely.     Recommendations for follow up therapy are one component of a multi-disciplinary discharge planning process, led by the attending physician.  Recommendations may be updated based on patient status, additional functional criteria and insurance authorization.  Follow Up Recommendations  Skilled nursing-short term rehab (<3 hours/day)     Assistance Recommended at Discharge Frequent or constant Supervision/Assistance  Patient can return home with the following A little help with bathing/dressing/bathroom;A lot of help with walking and/or transfers;Assistance with cooking/housework;Assist for transportation;Help with stairs or ramp for entrance   Equipment Recommendations  Wheelchair (measurements PT);Wheelchair cushion (measurements PT)    Recommendations for Other Services       Precautions / Restrictions Precautions Precautions: Fall Precaution Comments: incontinence; R BKA, L AKA Required Braces or Orthoses: Other Brace Other Brace: prosthetics for BLE Restrictions Weight Bearing Restrictions: No     Mobility  Bed Mobility Overal bed mobility: Needs Assistance Bed Mobility: Supine to Sit     Supine to sit: HOB  elevated, Supervision     General bed mobility comments: supervision for safety    Transfers Overall transfer level: Needs assistance Equipment used: Rolling walker (2 wheels) Transfers: Sit to/from Stand Sit to Stand: Min assist, From elevated surface           General transfer comment: Min A for balance to stand using RLE prosthetic. LLE sleeve still soiled, so unable to use L prosthetic.    Ambulation/Gait Ambulation/Gait assistance: Mod assist Gait Distance (Feet): 1 Feet Assistive device: Rolling walker (2 wheels)         General Gait Details: Practiced taking hop step at EOB. Pt very unsteady only using RLE prosthetic and required mod A for steadying.   Stairs             Wheelchair Mobility    Modified Rankin (Stroke Patients Only)       Balance Overall balance assessment: Needs assistance Sitting-balance support: Single extremity supported Sitting balance-Leahy Scale: Good     Standing balance support: Bilateral upper extremity supported Standing balance-Leahy Scale: Poor Standing balance comment: Reliant on BUE support                            Cognition Arousal/Alertness: Awake/alert Behavior During Therapy: WFL for tasks assessed/performed Overall Cognitive Status: No family/caregiver present to determine baseline cognitive functioning                                 General Comments: Repetitive during session. Slow processing and likely close to baseline        Exercises General Exercises -  Lower Extremity Long Arc Quad: AROM, Right, 10 reps, Seated Hip Flexion/Marching: AROM, Right, 10 reps, Seated Other Exercises Other Exercises: verbally reviewed sidelying hip extension with pt    General Comments General comments (skin integrity, edema, etc.): Gave wash pan and soap to clean sleeves for prosthetics so that L prosthetic can be used during next session      Pertinent Vitals/Pain Pain Assessment Pain  Assessment: No/denies pain    Home Living                          Prior Function            PT Goals (current goals can now be found in the care plan section) Acute Rehab PT Goals Patient Stated Goal: to get rehab help PT Goal Formulation: With patient Time For Goal Achievement: 09/25/21 Potential to Achieve Goals: Good Progress towards PT goals: Progressing toward goals    Frequency    Min 2X/week      PT Plan Frequency needs to be updated    Co-evaluation              AM-PAC PT "6 Clicks" Mobility   Outcome Measure  Help needed turning from your back to your side while in a flat bed without using bedrails?: A Little Help needed moving from lying on your back to sitting on the side of a flat bed without using bedrails?: A Little Help needed moving to and from a bed to a chair (including a wheelchair)?: A Lot Help needed standing up from a chair using your arms (e.g., wheelchair or bedside chair)?: A Lot Help needed to walk in hospital room?: A Lot Help needed climbing 3-5 steps with a railing? : Total 6 Click Score: 13    End of Session Equipment Utilized During Treatment: Gait belt Activity Tolerance: Patient tolerated treatment well Patient left: in bed (in bed in ED) Nurse Communication: Mobility status PT Visit Diagnosis: Unsteadiness on feet (R26.81);Muscle weakness (generalized) (M62.81);Difficulty in walking, not elsewhere classified (R26.2);History of falling (Z91.81);Repeated falls (R29.6)     Time: 1229-1300 PT Time Calculation (min) (ACUTE ONLY): 31 min  Charges:  $Therapeutic Activity: 23-37 mins                     Lou Miner, DPT  Acute Rehabilitation Services  Office: [phone]    Rudean Hitt 09/13/2021, 1:45 PM

## 2021-09-13 NOTE — Progress Notes (Signed)
[employer] requested peer to peer, [phone], option 5. Peer to Peer was approved.

## 2021-09-13 NOTE — ED Notes (Signed)
Patient assisted to change TV.  Requesting drink.  Patient calm and cooperative.  No other needs voiced at this time

## 2021-09-13 NOTE — ED Notes (Signed)
Patient provided with night time snack per request.  Room straighten.  Patient currently resting quietly watching TV and talking on cell phone

## 2021-09-13 NOTE — ED Provider Notes (Signed)
Emergency Medicine Observation Re-evaluation Note  Keith [name] is a 56 y.o. male, seen on rounds today.  Pt initially presented to the ED for complaints of Fall Currently, the patient is lying in bed, ate breakfast.  Physical Exam  BP 126/88 (BP Location: Right Arm)   Pulse 73   Temp 98.3 F (36.8 C) (Oral)   Resp 16   SpO2 98%  Physical Exam General: speech clear Lungs: respirations even and unlabored Psych: calm, oriented   ED Course / MDM  EKG:   I have reviewed the labs performed to date as well as medications administered while in observation.  Recent changes in the last 24 hours include requests dc his metformin, states this was supposed to have been dc due to his kidney function.  Plan  Current plan is for awaiting placement.  Keith [name] is not under involuntary commitment.     Tacy Learn, PA-C 09/13/21 Benton, Julie, MD 09/13/21 1336

## 2021-09-13 NOTE — ED Notes (Signed)
Pt at bedside

## 2021-09-14 ENCOUNTER — Ambulatory Visit: Payer: Medicare Other

## 2021-09-14 DIAGNOSIS — R296 Repeated falls: Secondary | ICD-10-CM | POA: Diagnosis not present

## 2021-09-14 LAB — CBG MONITORING, ED
Glucose-Capillary: 189 mg/dL — ABNORMAL HIGH (ref 70–99)
Glucose-Capillary: 161 mg/dL — ABNORMAL HIGH (ref 70–99)
Glucose-Capillary: 135 mg/dL — ABNORMAL HIGH (ref 70–99)

## 2021-09-14 IMAGING — MR MR FOOT*L* WO/W CM
9 series · 40 of 40 positions shown · IV contrast (gadavist)
Comparison: Multiple exams, including 10/07/2020 and radiographs
from 10/17/2020.

CLINICAL DATA: Heel ulceration with swelling of the foot. Possible
osteomyelitis.

EXAM:
MRI OF THE LEFT FOREFOOT WITHOUT AND WITH CONTRAST
TECHNIQUE: Multiplanar, multisequence MR imaging of the left foot was performed
both before and after administration of intravenous contrast.
CONTRAST:  10mL GADAVIST GADOBUTROL 1 MMOL/ML IV SOLN

[Series 5: T1 · coronal · left · 3.0mm · 0.47mm/px · 6 of 94 slices shown (1 of 2)]
[im 1/94]
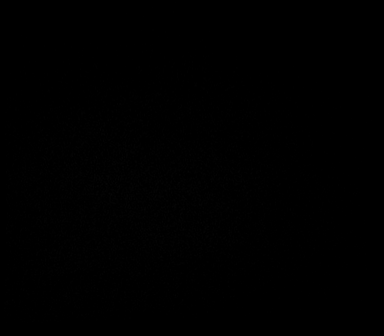
[im 19/94]
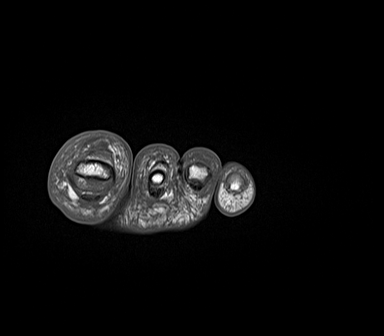
[im 38/94]
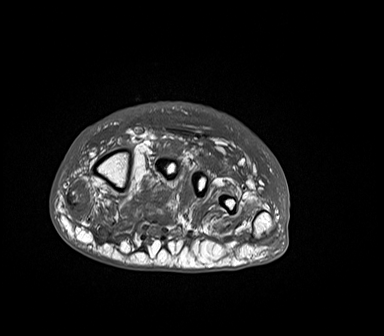
[im 56/94]
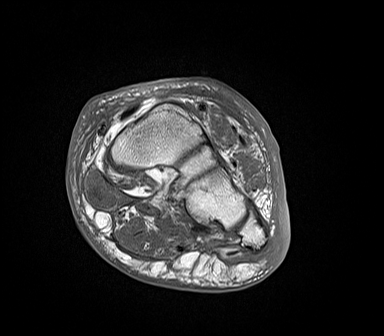
[im 75/94]
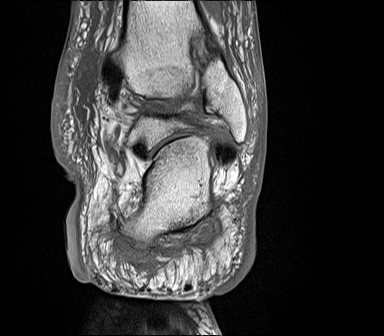
[im 94/94]
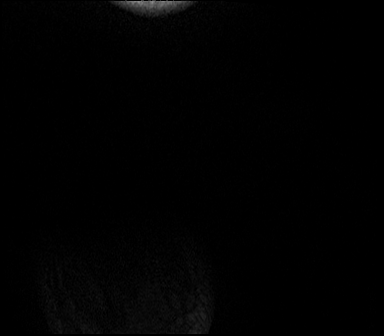

[Series 6: T2 fat-sat · coronal · left · 3.0mm · 0.49mm/px · 7 of 94 slices shown (1 of 2)]
[im 1/94]
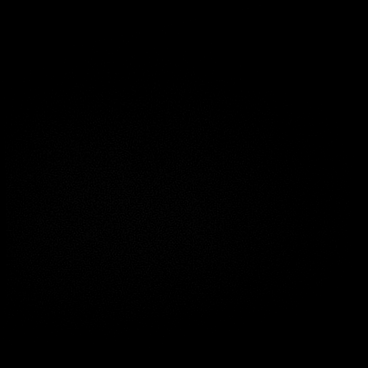
[im 16/94]
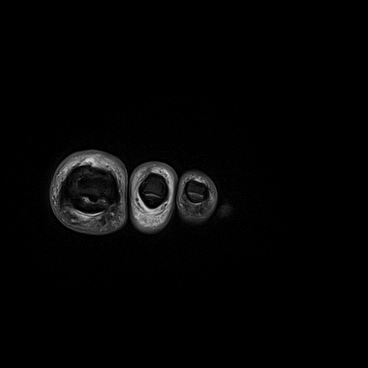
[im 32/94]
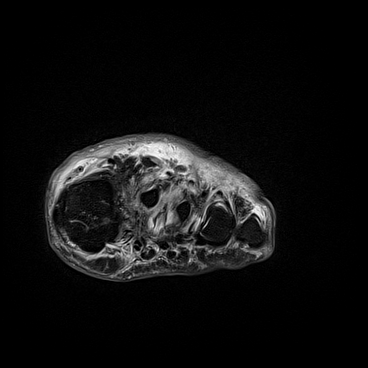
[im 47/94]
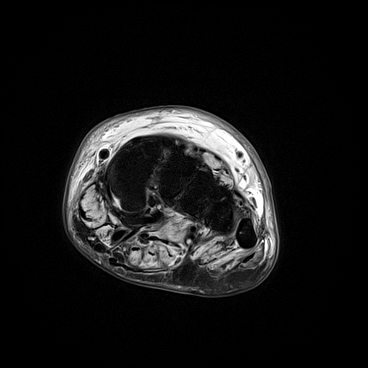
[im 63/94]
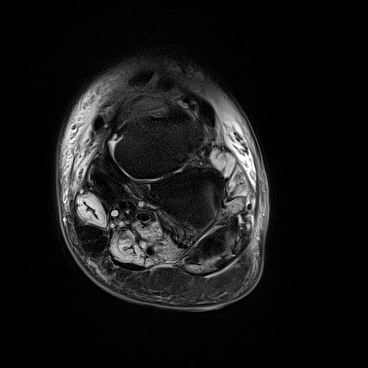
[im 78/94]
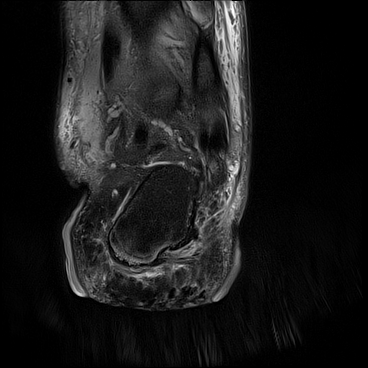
[im 94/94]
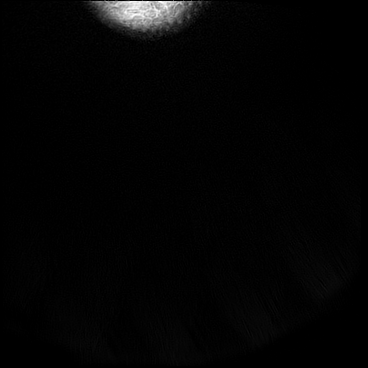

[Series 7: T1 · axial · left · 3.0mm · 0.78mm/px · z∈[-90,+42]mm · 3 of 37 slices shown (2 of 2)]
[im 1/37]
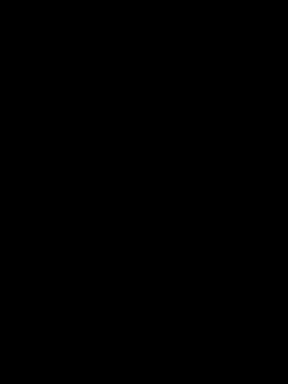
[im 19/37]
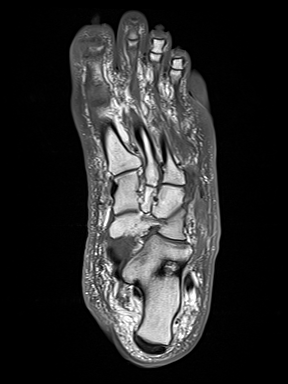
[im 37/37]
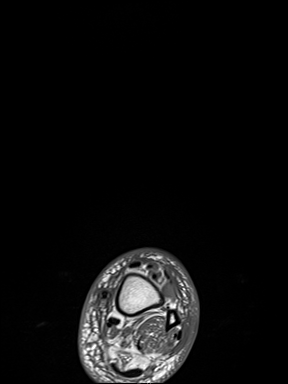

[Series 8: T2 fat-sat · axial · left · 3.0mm · 0.89mm/px · z∈[-80,+51]mm · 3 of 38 slices shown (2 of 2)]
[im 1/38]
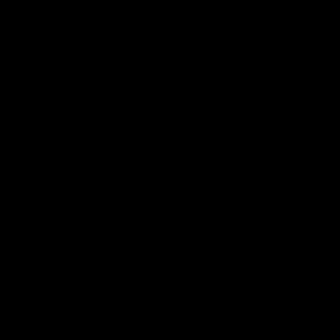
[im 19/38]
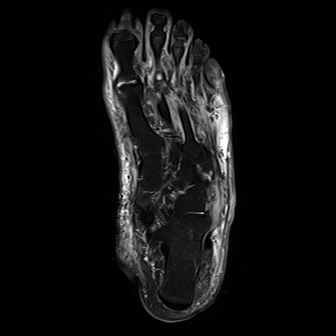
[im 38/38]
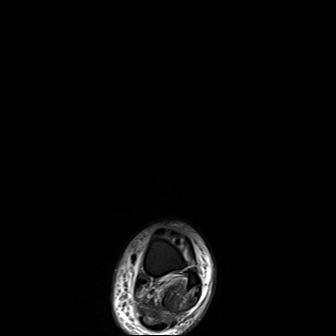

[Series 9: STIR · sagittal · left · 3.0mm · 1.17mm/px · 3 of 41 slices shown]
[im 1/41]
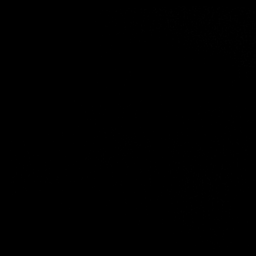
[im 21/41]
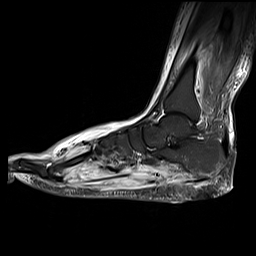
[im 41/41]
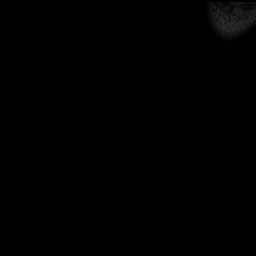

[Series 10: T1 fat-sat · coronal · non-contrast · left · 3.0mm · 0.59mm/px · 6 of 91 slices shown (1 of 3)]
[im 1/91]
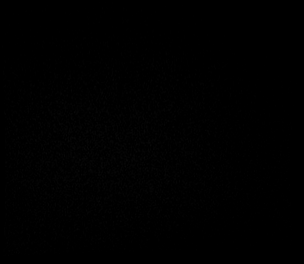
[im 19/91]
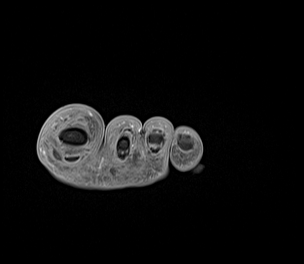
[im 37/91]
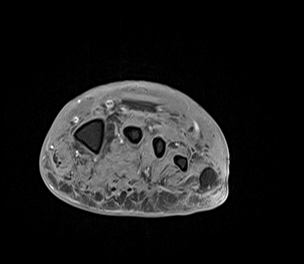
[im 55/91]
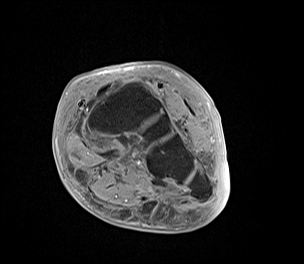
[im 73/91]
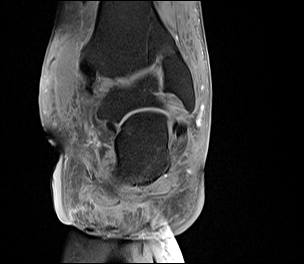
[im 91/91]
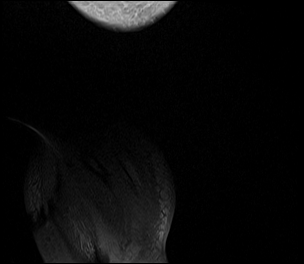

[Series 12: T1 fat-sat · sagittal · left · 3.0mm · 0.99mm/px · 3 of 42 slices shown (2 of 3)]
[im 1/42]
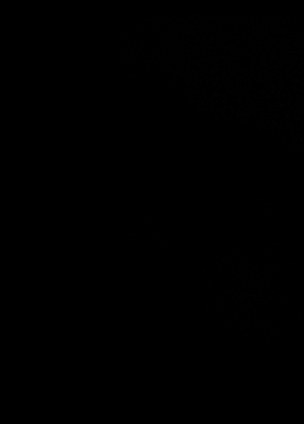
[im 21/42]
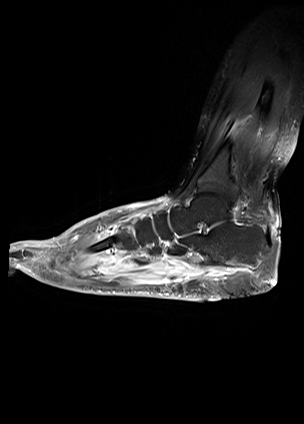
[im 42/42]
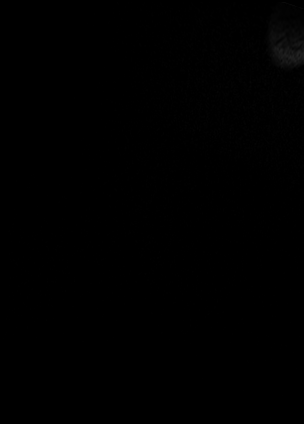

[Series 14: T1 fat-sat post-contrast · coronal · left · 3.0mm · 0.59mm/px · 7 of 94 slices shown]
[im 1/94]
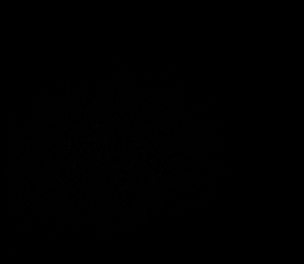
[im 16/94]
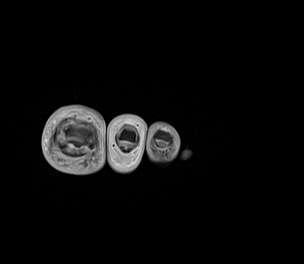
[im 32/94]
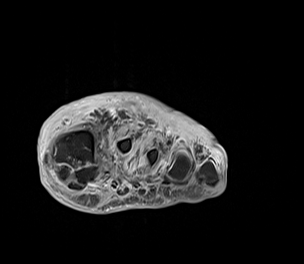
[im 47/94]
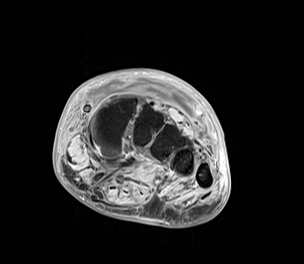
[im 63/94]
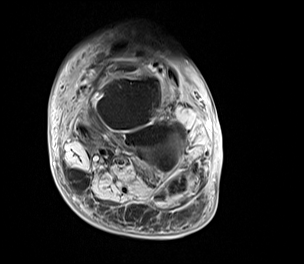
[im 78/94]
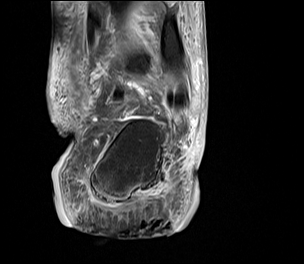
[im 94/94]
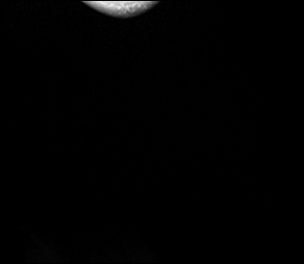

[Series 15: T1 fat-sat · axial · left · 3.0mm · 0.94mm/px · z∈[-80,+51]mm · 2 of 35 slices shown (3 of 3)]
[im 1/35]
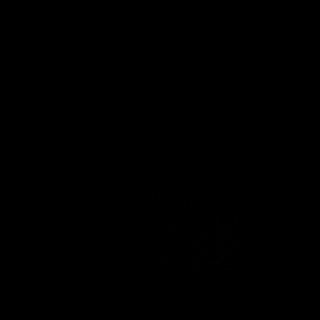
[im 35/35]
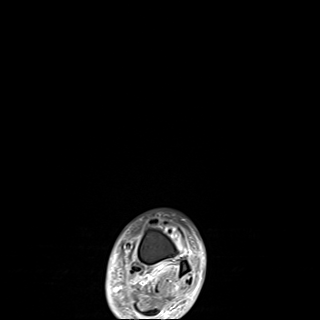

[40 of 40 positions shown; findings below may reference images not displayed]

FINDINGS: Osteomyelitis protocol MRI of the foot was obtained, to include the
entire foot and ankle. This protocol uses a large field of view to
cover the entire foot and ankle, and is suitable for assessing bony
structures for osteomyelitis. Due to the large field of view and
imaging plane choice, this protocol is less sensitive for assessing
small structures such as ligamentous structures of the foot and
ankle, compared to a dedicated forefoot or dedicated hindfoot exam.

Bones/Joint/Cartilage

Suspected early calcaneal osteomyelitis is observed with endosteal
and adjacent marrow edema and enhancement along the posteroinferior
calcaneus adjacent to the large heel ulceration. This is
substantially progressive compared to 10/07/2020. Strictly speaking,
reactive findings could cause a similar appearance but the
progression in bony appearance as well as progression in the
ulceration are concerning for early osteomyelitis. No overt bony
destructive findings.

Small degenerative lesions along the first MTP joint associated with
spurring

Ligaments

Lisfranc ligament grossly intact. No large or prominent ligamentous
derangement along the ankle.

Muscles and Tendons

Diffuse low-grade muscular edema and potentially low-grade
enhancement, probably neurogenic. Potential mild proximal plantar
fasciitis.

Soft tissues

Plantar ulceration below the calcaneus about 6.6 by 7.1 cm, with
speckled low density in the soft tissues compatible with gas, and
associated soft tissue hypoenhancement extending to the periosteal
margin of the calcaneus and adjacent plantar fascia.

Diffuse subcutaneous edema tracking in the dorsum of the foot and
medially and laterally along the ankles suspicious for potential
cellulitis. In the subcutaneous tissues superficial to the medial
malleolus and medial flexor tendons, elongated bands of
hypoenhancement measuring about 5.5 by 0.8 by 4.1 cm may represent
incipient abscess formation although overt drainable abscess is not
currently identified.
IMPRESSION: 1. Progressive calcaneal ulceration associated with new endosteal
and adjacent marrow edema and enhancement along the posteroinferior
calcaneus suspicious for early osteomyelitis.
2. Subcutaneous edema medially and laterally in the ankle and also
along the dorsum of the foot favoring cellulitis. Faint bands of
hypoenhancement superficial to the medial malleolus may represent
incipient abscess formation although currently a drainable abscess
is not identified.
3. Probable neurogenic edema in the muscular tissues diffusely.
4. Potential mild plantar fasciitis.
5. Degenerative findings at the first MTP joint.

## 2021-09-14 NOTE — ED Notes (Signed)
Received call from Larene Beach, CSW Psychologist, occupational facility could not admit patient unitl 1530.  [facility] called and updated with new transport time.

## 2021-09-14 NOTE — ED Provider Notes (Signed)
Emergency Medicine Observation Re-evaluation Note  Keith [name] is a 56 y.o. male, seen on rounds today.  Pt initially presented to the ED for complaints of Fall Currently, the patient is resting quietly.  Physical Exam  BP (!) 142/92 (BP Location: Left Arm)   Pulse 71   Temp 97.6 F (36.4 C) (Oral)   Resp 20   SpO2 98%  Physical Exam General: No acute distress Cardiac: Well-perfused Lungs: Nonlabored Psych: Cooperative  ED Course / MDM  EKG:   I have reviewed the labs performed to date as well as medications administered while in observation.  Recent changes in the last 24 hours include social work working on placement.  Plan  Current plan is for placement possibly today.  Keith [name] is not under involuntary commitment.     Hayden Rasmussen, MD 09/14/21 [phone]

## 2021-09-14 NOTE — ED Notes (Signed)
Writer called [phone] to arrange transport to [facility].  Dispatch reports ETA is "a couple of hours."

## 2021-09-14 NOTE — ED Notes (Signed)
Report called to Solmon Ice, Therapist, sports at [facility]

## 2021-09-14 NOTE — ED Notes (Addendum)
Writer called [phone] to give report, however after transfer phone rang several minutes with no answer.  Will call back.

## 2021-09-14 NOTE — Progress Notes (Signed)
Patient discharging to [facility]. Room 107, number for report [phone].

## 2021-09-17 ENCOUNTER — Encounter: Payer: Self-pay | Admitting: Infectious Diseases

## 2021-09-18 ENCOUNTER — Ambulatory Visit: Payer: Medicare Other | Admitting: Rehabilitation

## 2021-09-20 ENCOUNTER — Ambulatory Visit: Payer: Medicare Other | Admitting: Physical Therapy

## 2021-09-25 ENCOUNTER — Encounter: Payer: Medicaid Other | Admitting: Rehabilitation

## 2021-09-27 ENCOUNTER — Encounter: Payer: Medicaid Other | Admitting: Physical Therapy

## 2021-09-28 ENCOUNTER — Telehealth: Payer: Self-pay | Admitting: Orthopedic Surgery

## 2021-09-28 IMAGING — US US RENAL
1 series · 14 of 25 positions shown · non-contrast
Comparison: Renal ultrasound dated 03/23/2020.

CLINICAL DATA: 54-year-old male with acute renal insufficiency.

EXAM:
RENAL / URINARY TRACT ULTRASOUND COMPLETE

[Series 1: us renal · 14 of 34 slices shown]
[im 1/34]
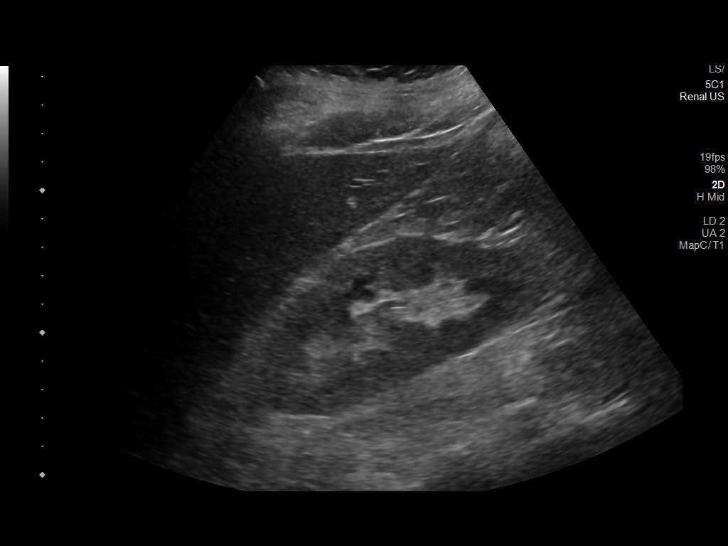
[im 3/34]
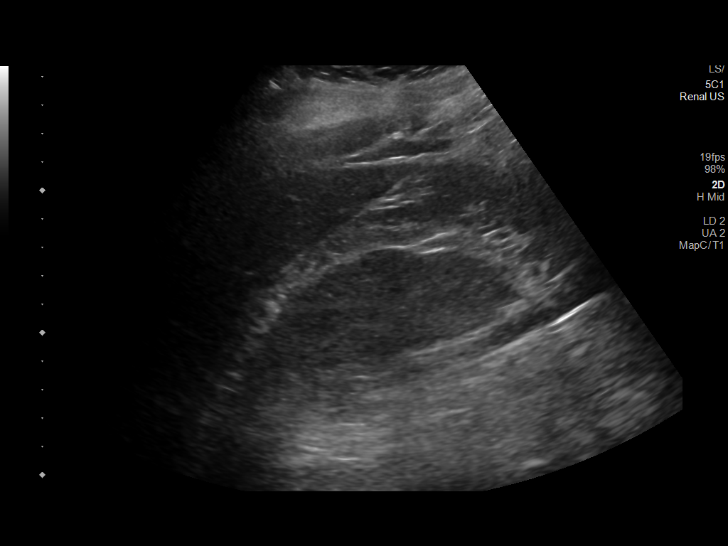
[im 6/34]
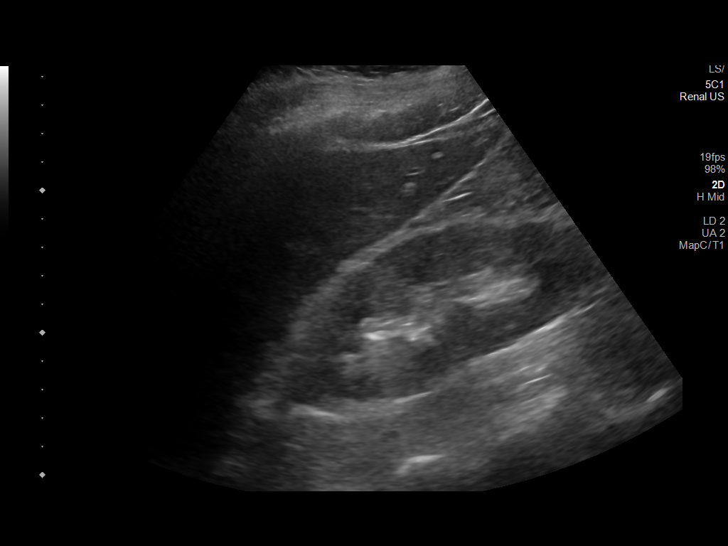
[im 9/34]
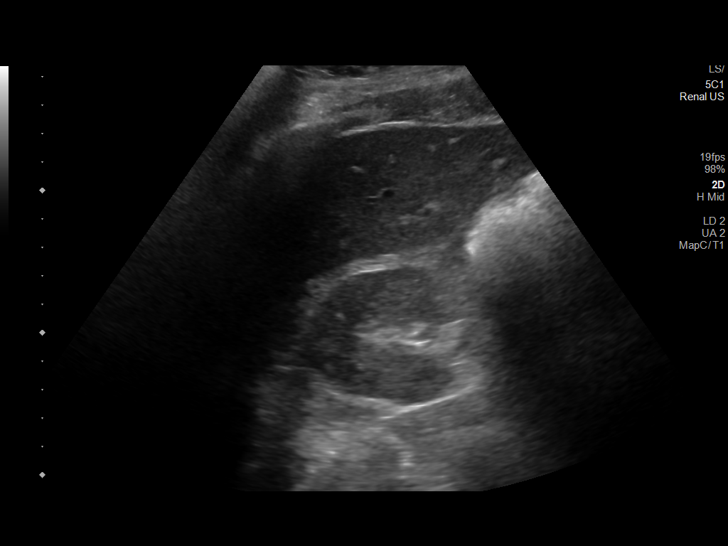
[im 12/34]
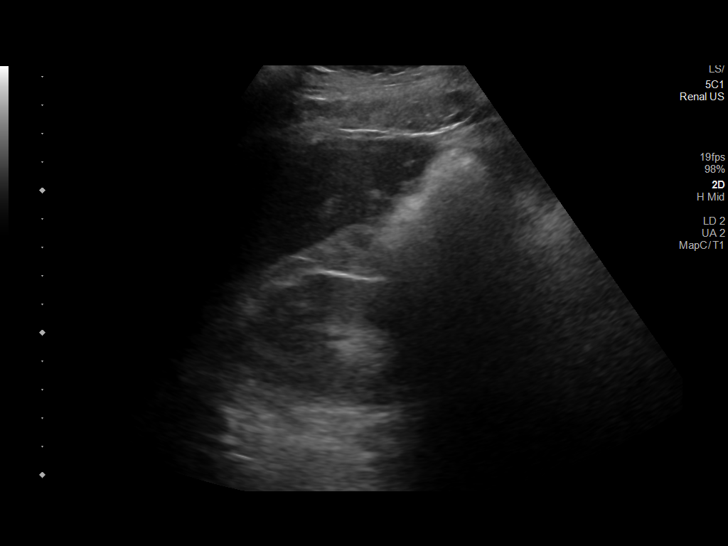
[im 13/34]
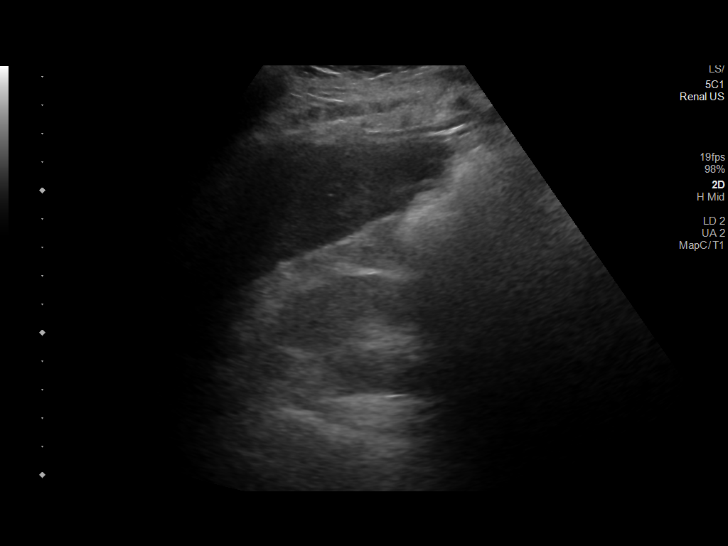
[im 16/34]
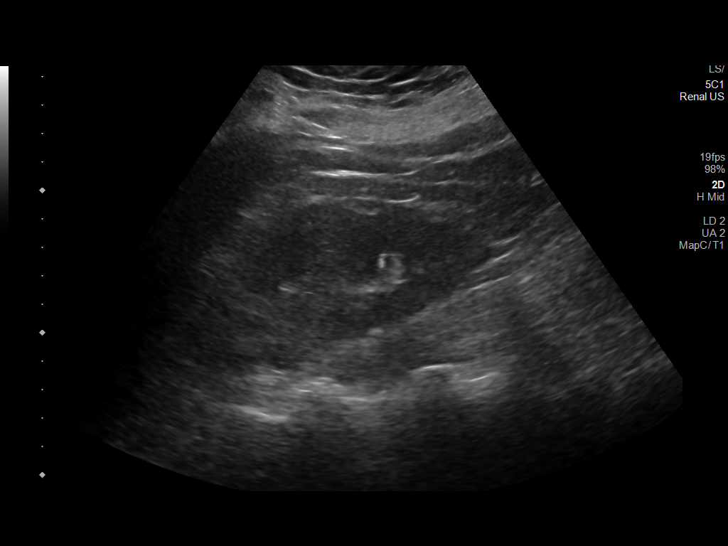
[im 18/34]
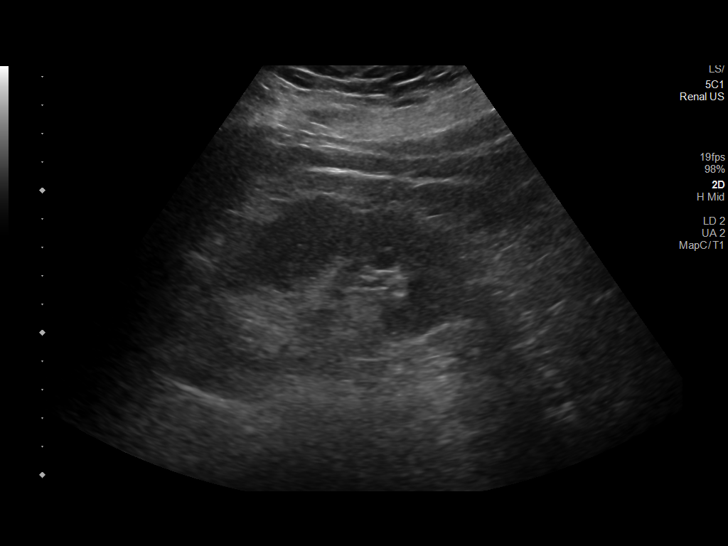
[im 21/34]
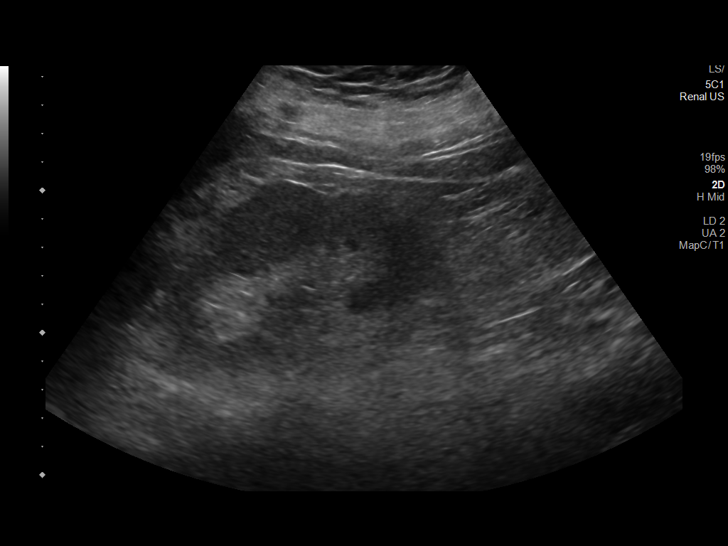
[im 23/34]
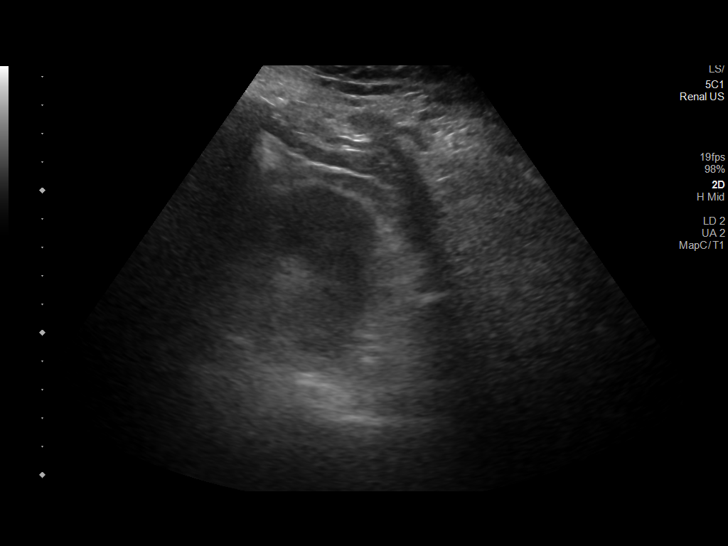
[im 25/34]
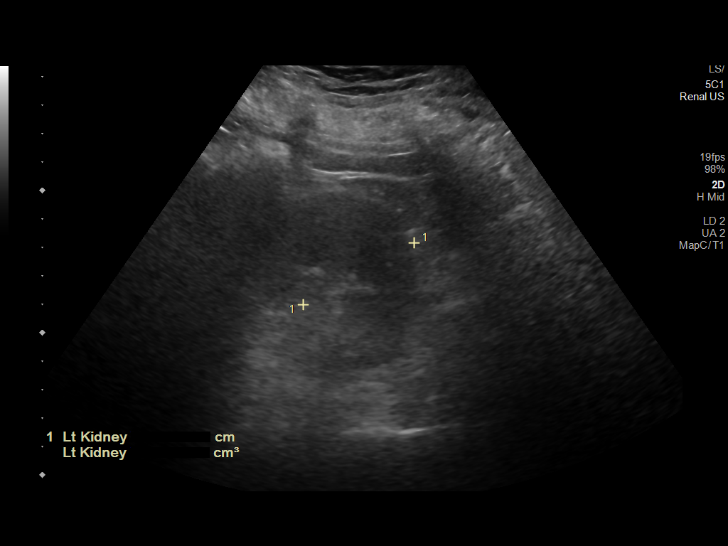
[im 28/34]
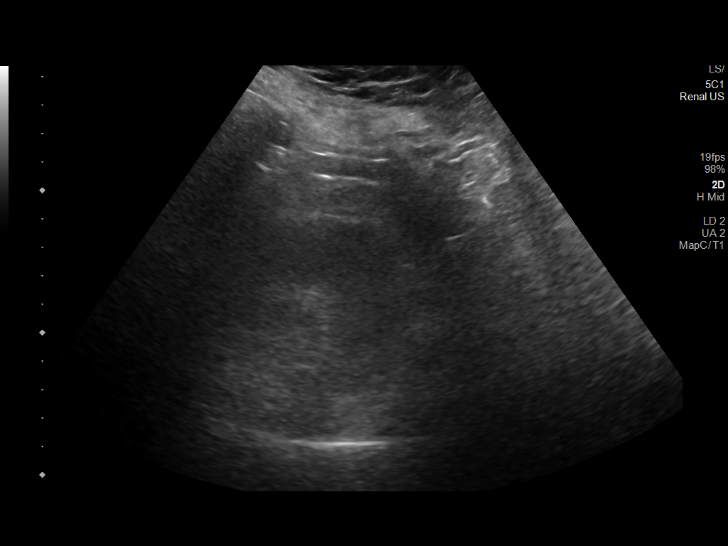
[im 31/34]
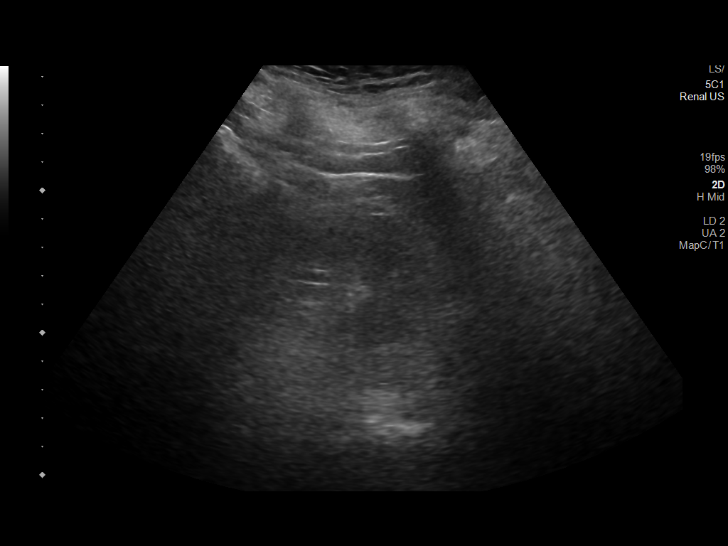
[im 34/34]
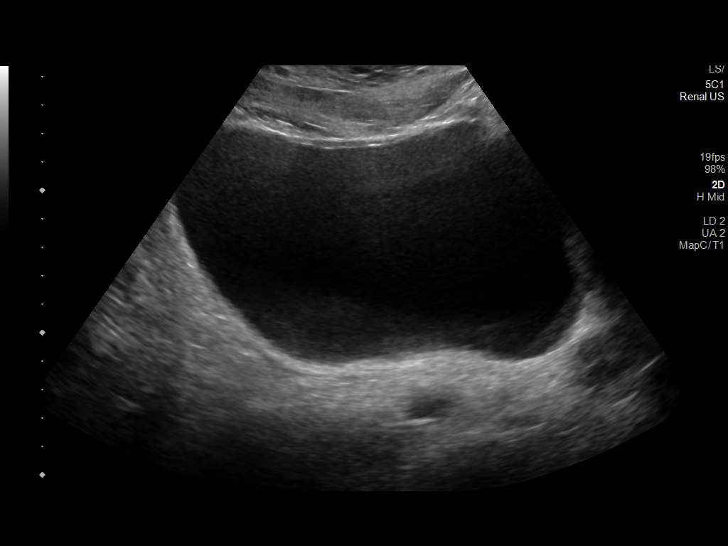

[14 of 25 positions shown; findings below may reference images not displayed]

FINDINGS: Right Kidney:

Renal measurements: 11.9 x 4.6 x 4.7 cm = volume: 135 mL. Mild
increased echogenicity. No hydronephrosis or shadowing stone.

Left Kidney:

Renal measurements: 10.8 x 5.5 x 4 8 cm = volume: 150 mL. Mild
increased echogenicity. No hydronephrosis or shadowing stone.

Bladder:

Appears normal for degree of bladder distention.

Other:

None.
IMPRESSION: Mildly echogenic kidneys may represent medical renal disease. No
hydronephrosis or shadowing stone.

## 2021-09-28 NOTE — Telephone Encounter (Signed)
Patient home health services called in wanting verbal consent that he can put off treatment until he finishes moving her his request. Patient said to call him Monday, please advise. Please call Archie Patten and let her know what is going on [phone].

## 2021-09-29 IMAGING — US IR FLUORO GUIDE CV LINE*R*
1 series · 4 of 4 positions shown · non-contrast
Comparison: none

INDICATION: 54-year-old with acute kidney injury.

[Series 1: ir fluoro guide cv line*right* · 4 of 4 slices shown]
[im 1/4]
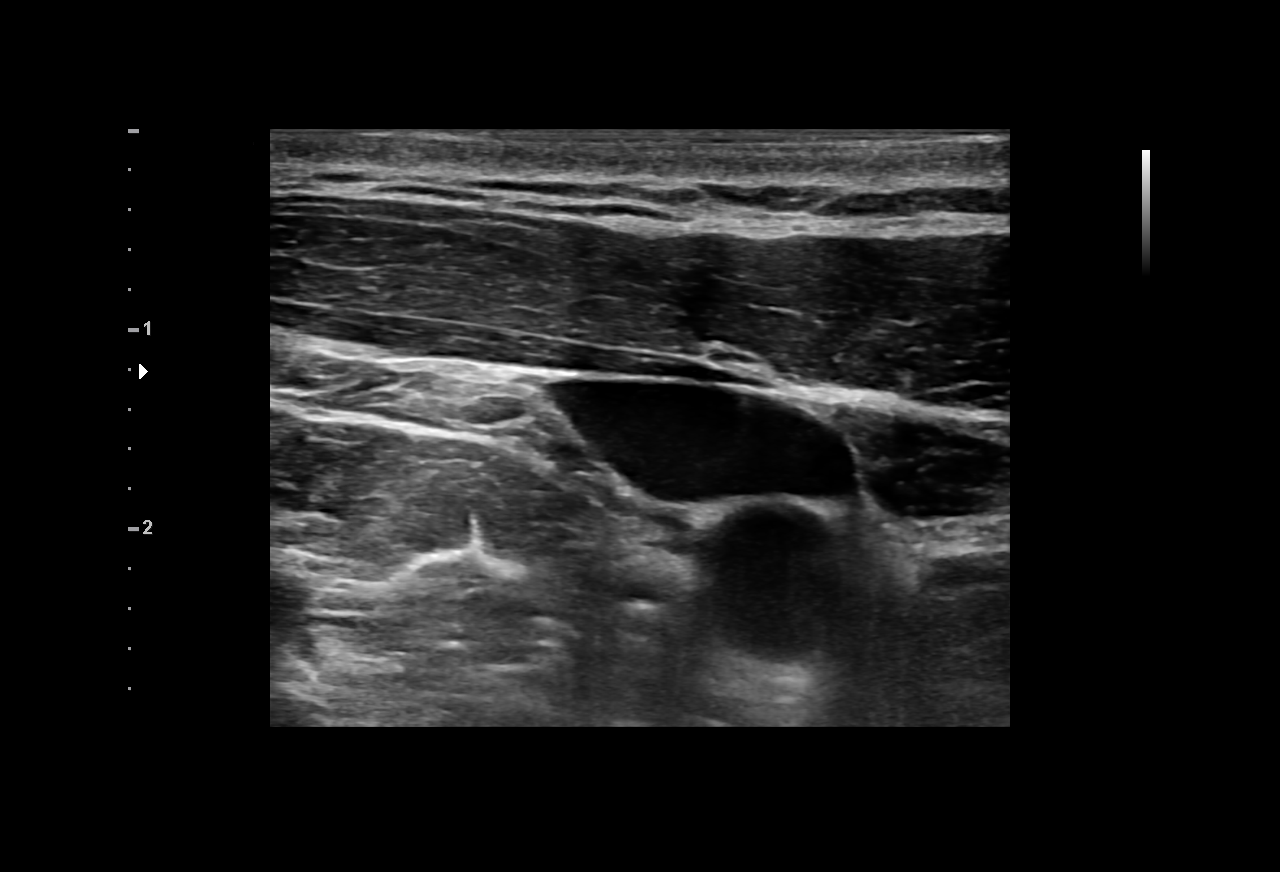
[im 2/4]
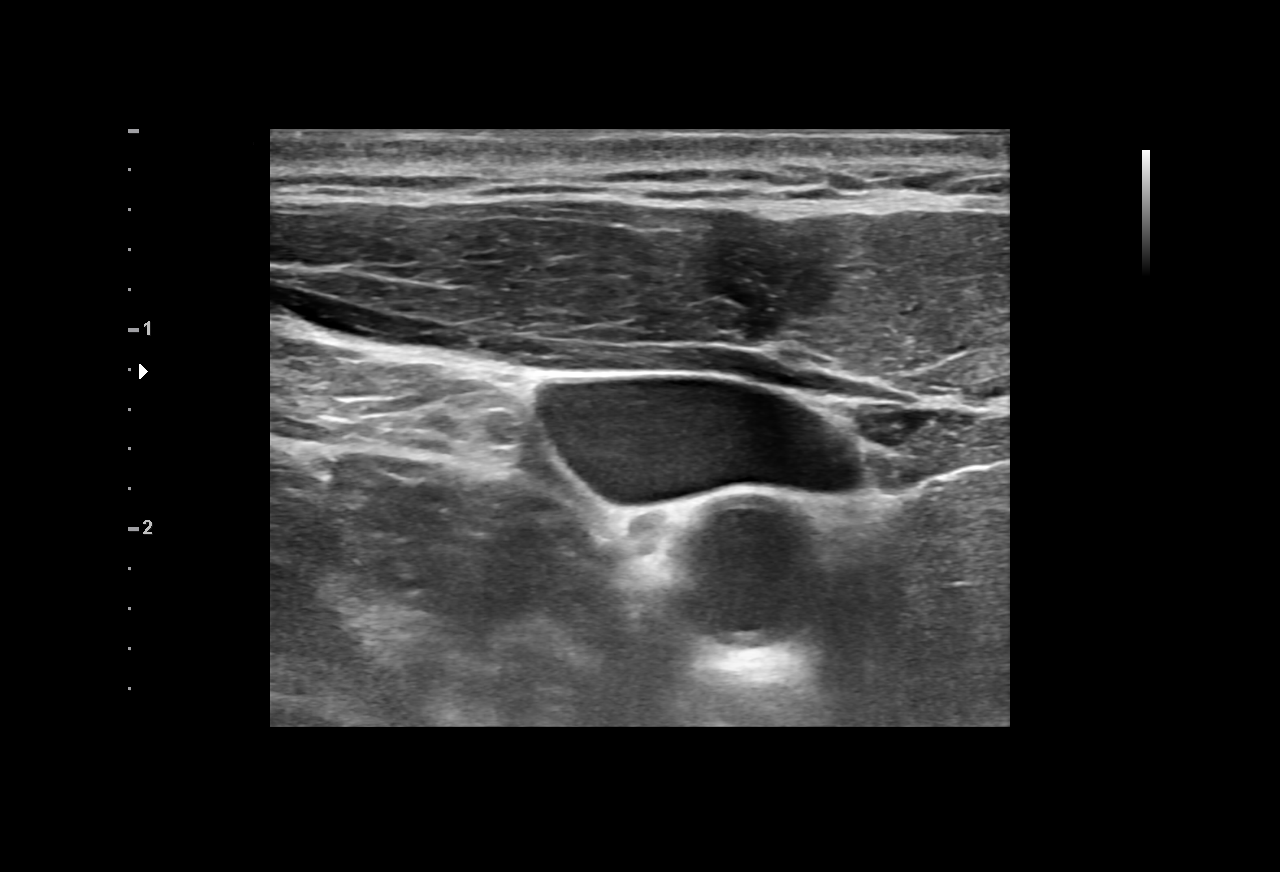
[im 3/4]
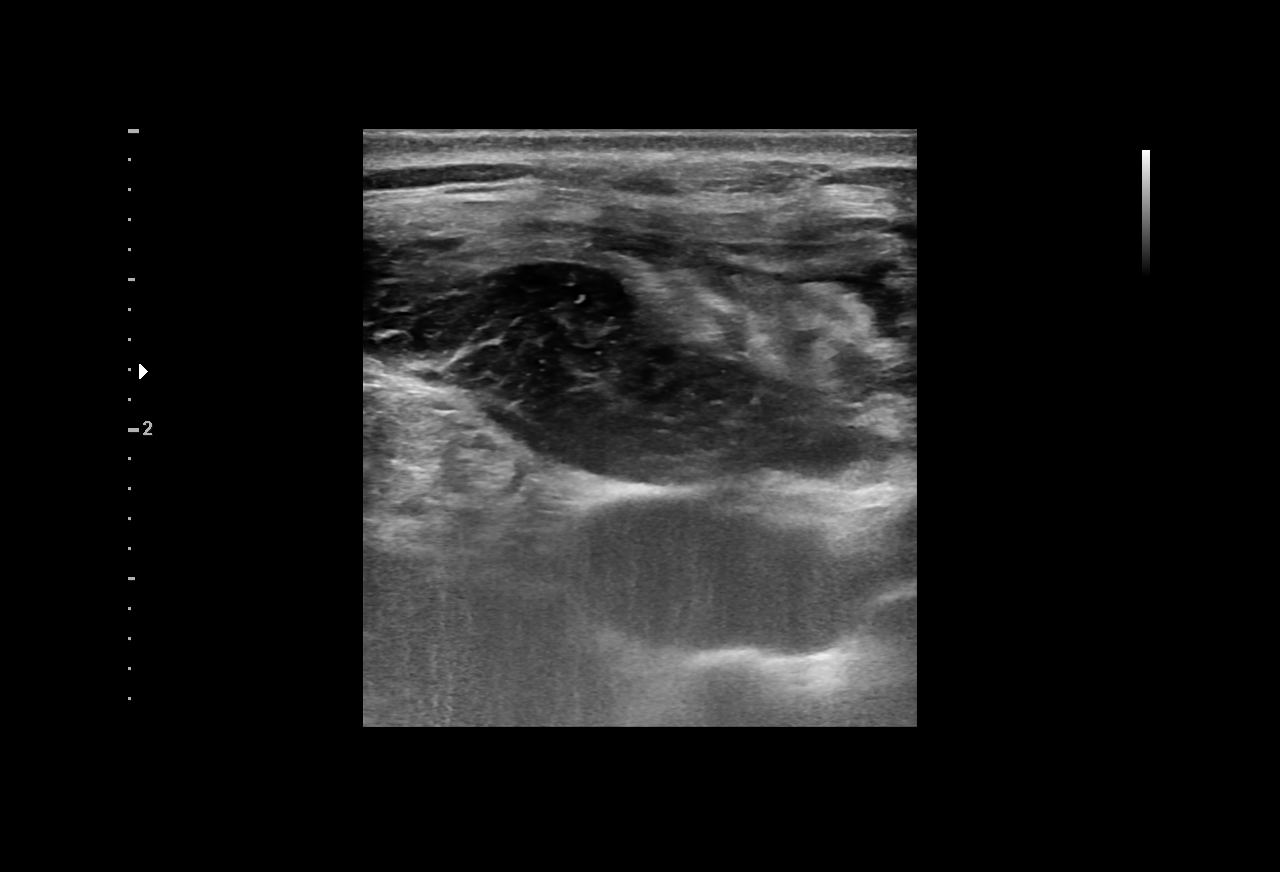
[im 4/4]
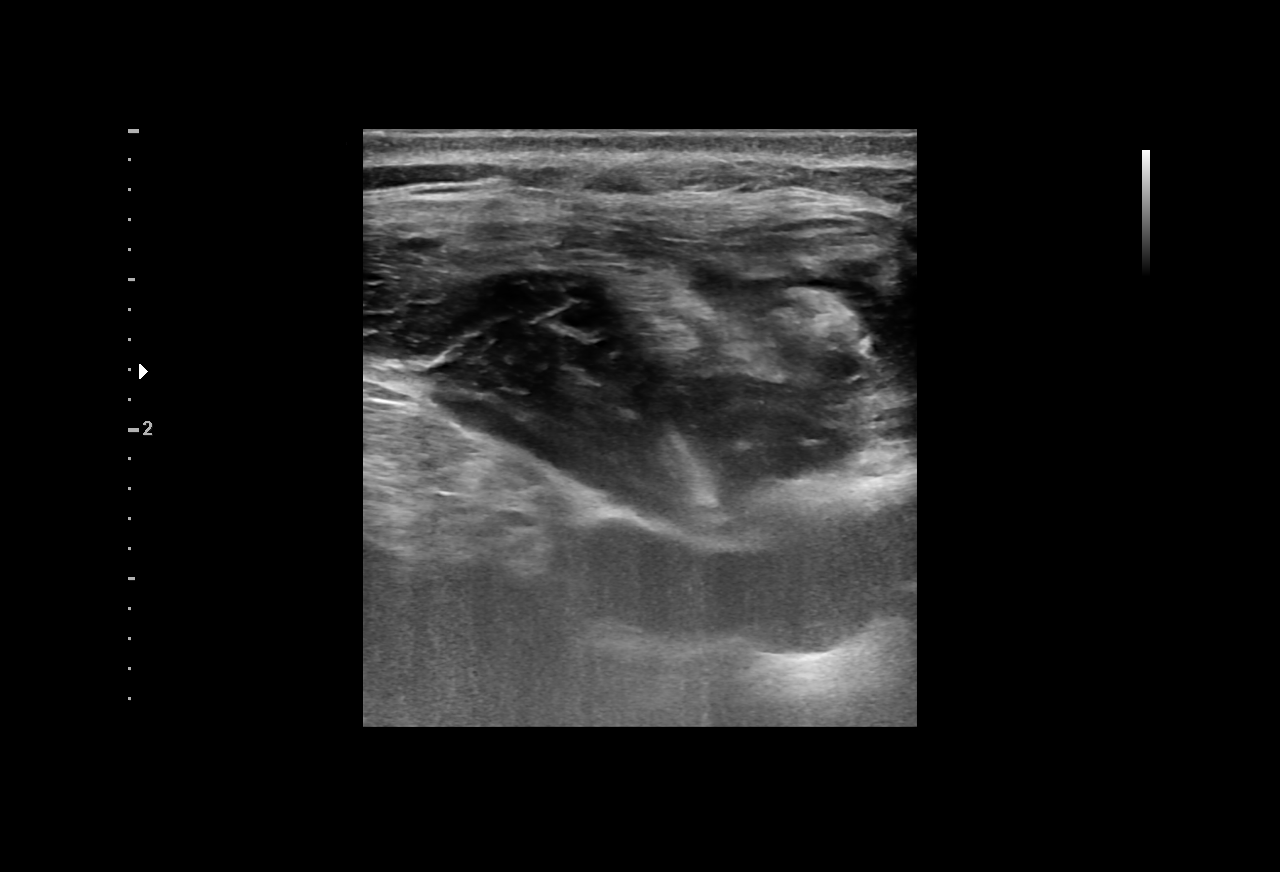

[4 of 4 positions shown; findings below may reference images not displayed]

EXAM:
FLUOROSCOPIC AND ULTRASOUND GUIDED PLACEMENT OF A TUNNELED DIALYSIS
CATHETER

MEDICATIONS:
Ancef 2 g; The antibiotic was administered within an appropriate
time interval prior to skin puncture.

ANESTHESIA/SEDATION:
Versed 0.5 mg IV; Fentanyl 25 mcg IV;

Moderate Sedation Time:  30 minutes

The patient was continuously monitored during the procedure by the
interventional radiology nurse under my direct supervision.

FLUOROSCOPY TIME:  Fluoroscopy Time: 6 seconds, 4 mGy

COMPLICATIONS:
None immediate.

PROCEDURE:
The procedure was explained to the patient. The risks and benefits
of the procedure were discussed and the patient's questions were
addressed. Informed consent was obtained from the patient. The
patient was placed supine on the interventional table. Ultrasound
confirmed a patent right internal jugular vein. Ultrasound image
obtained for documentation. The right neck and chest was prepped and
draped in a sterile fashion. Maximal barrier sterile technique was
utilized including caps, mask, sterile gowns, sterile gloves,
sterile drape, hand hygiene and skin antiseptic. The right neck was
anesthetized with 1% lidocaine. A small incision was made with #11
blade scalpel. A 21 gauge needle directed into the right internal
jugular vein with ultrasound guidance. A micropuncture dilator set
was placed. A 23 cm tip to cuff Palindrome catheter was selected.
The skin below the right clavicle was anesthetized and a small
incision was made with an #11 blade scalpel. A subcutaneous tunnel
was formed to the vein dermatotomy site. The catheter was brought
through the tunnel. The vein dermatotomy site was dilated to
accommodate a peel-away sheath. The catheter was placed through the
peel-away sheath and directed into the central venous structures.
The tip of the catheter was placed at superior cavoatrial junction
with fluoroscopy. Fluoroscopic images were obtained for
documentation. Both lumens were found to aspirate and flush well.
The proper amount of heparin was flushed in both lumens. The vein
dermatotomy site was closed using a single layer of absorbable
suture and Dermabond. Gel-Foam was placed in subcutaneous tract. The
catheter was secured to the skin using Prolene suture.
IMPRESSION: Successful placement of a right jugular tunneled dialysis catheter
using ultrasound and fluoroscopic guidance.

## 2021-10-02 ENCOUNTER — Encounter: Payer: Medicaid Other | Admitting: Rehabilitation

## 2021-10-04 ENCOUNTER — Other Ambulatory Visit: Payer: Self-pay | Admitting: Nurse Practitioner

## 2021-10-04 ENCOUNTER — Encounter: Payer: Medicaid Other | Admitting: Physical Therapy

## 2021-10-04 DIAGNOSIS — E119 Type 2 diabetes mellitus without complications: Secondary | ICD-10-CM

## 2021-10-07 IMAGING — CT CT ABD-PELV W/O CM
2 of 4 series · 16 of 46 positions shown, 18 images · non-contrast
Comparison: Renal ultrasound, 11/01/2020.

CLINICAL DATA: Hematuria.

EXAM:
CT ABDOMEN AND PELVIS WITHOUT CONTRAST
TECHNIQUE: Multidetector CT imaging of the abdomen and pelvis was performed
following the standard protocol without IV contrast.

[Series 3: abd/ pelvis 5.0 i30f 2 · axial · 0.98mm/px · z∈[+689,+1139]mm · 13 of 98 slices shown, 15 images]
[im 4/98  soft-tissue]
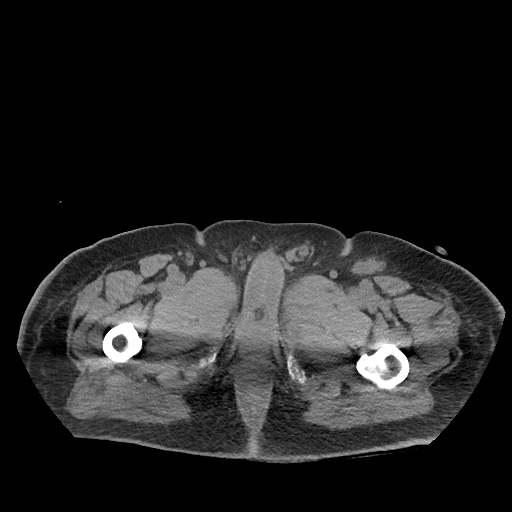
[im 4/98  bone]
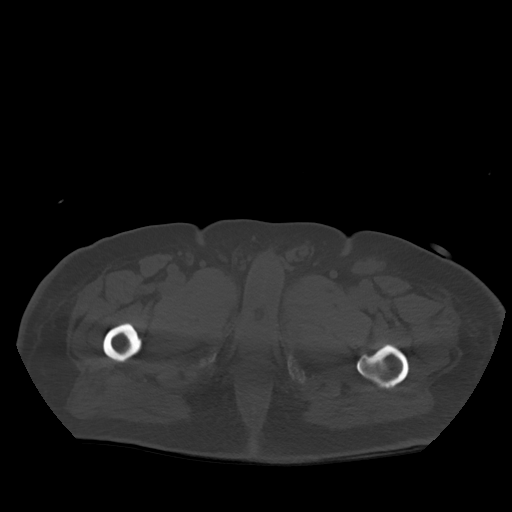
[im 12/98  soft-tissue]
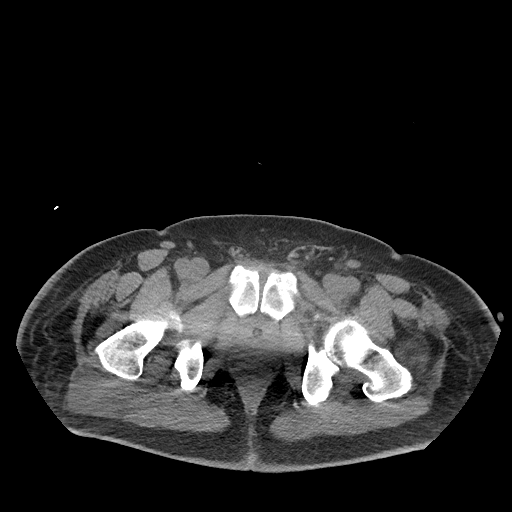
[im 20/98  soft-tissue]
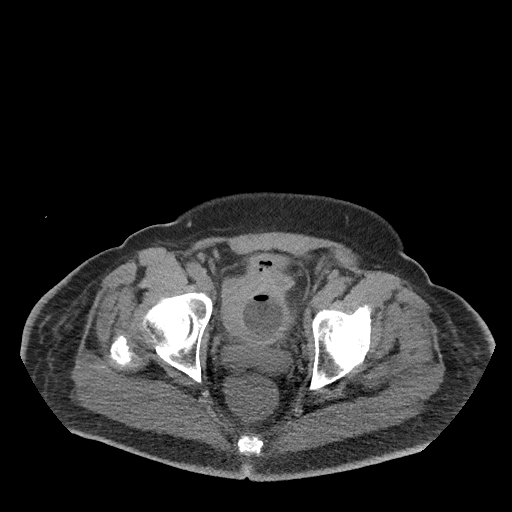
[im 28/98  soft-tissue]
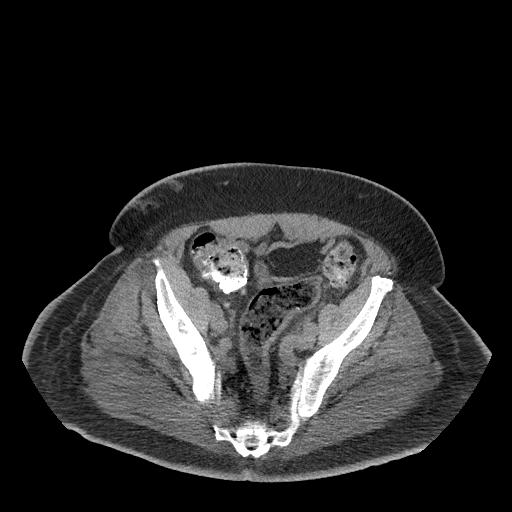
[im 35/98  soft-tissue]
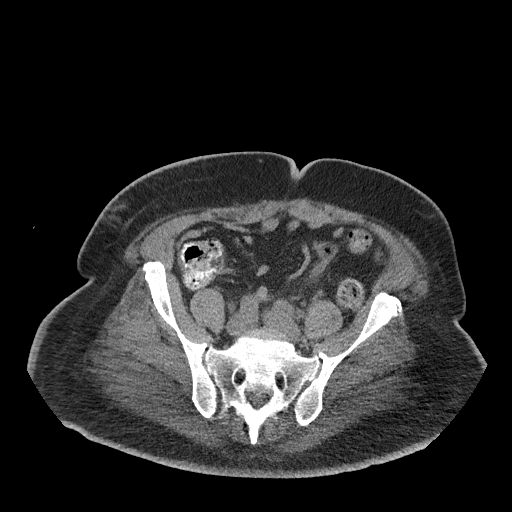
[im 43/98  soft-tissue]
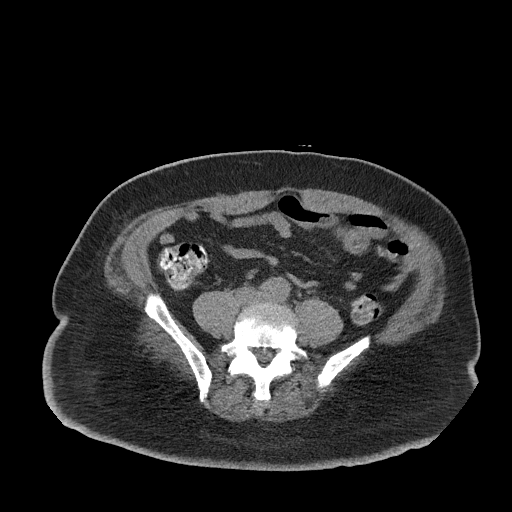
[im 51/98  soft-tissue]
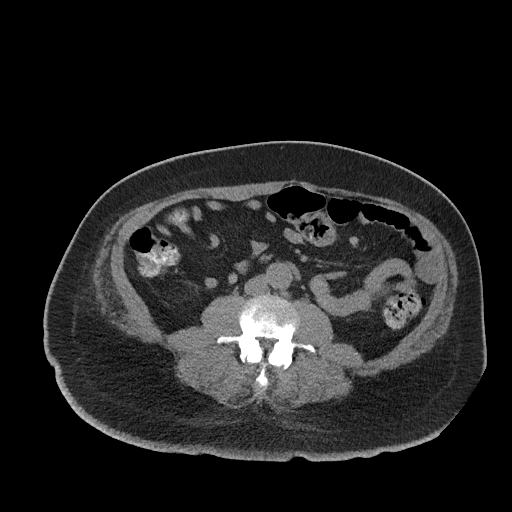
[im 55/98  soft-tissue]
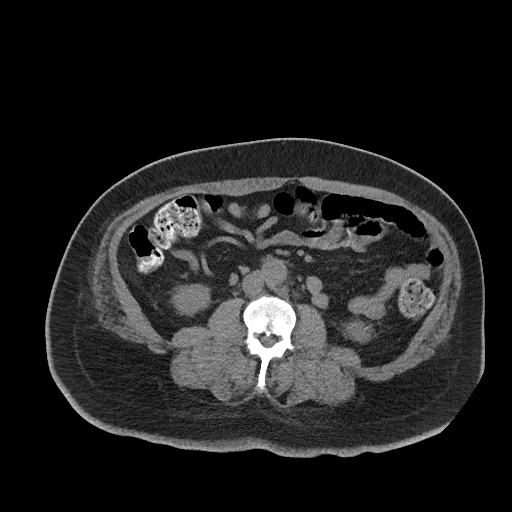
[im 63/98  soft-tissue]
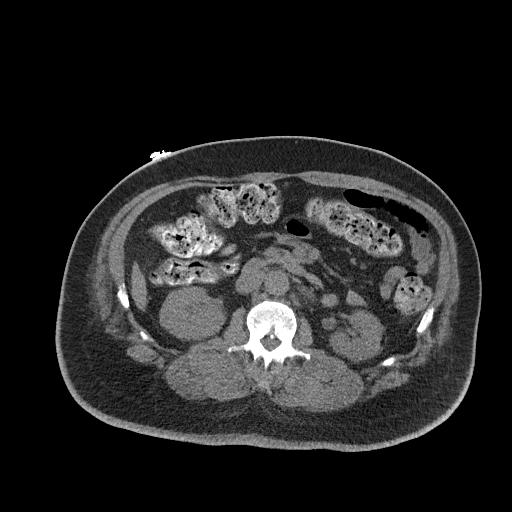
[im 63/98  bone]
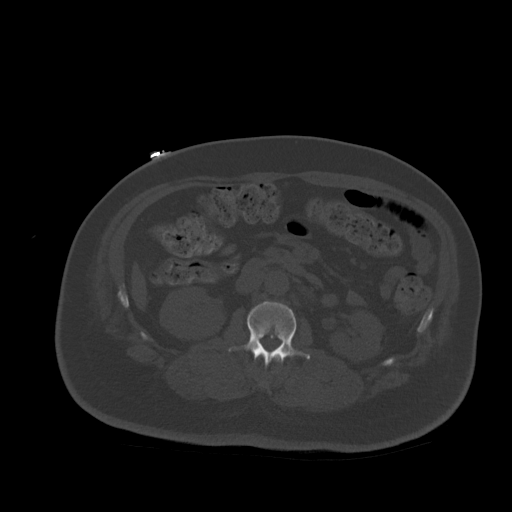
[im 70/98  soft-tissue]
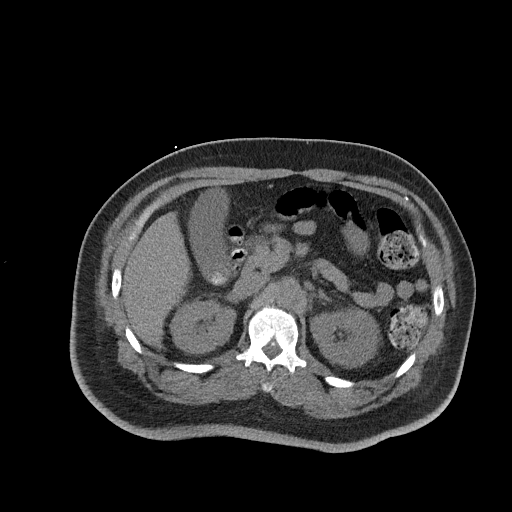
[im 78/98  soft-tissue]
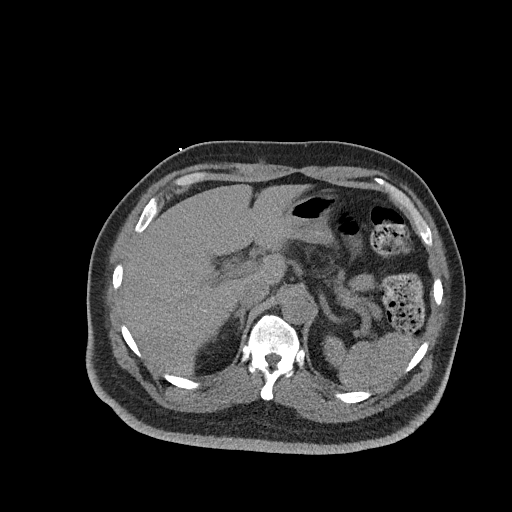
[im 86/98  soft-tissue]
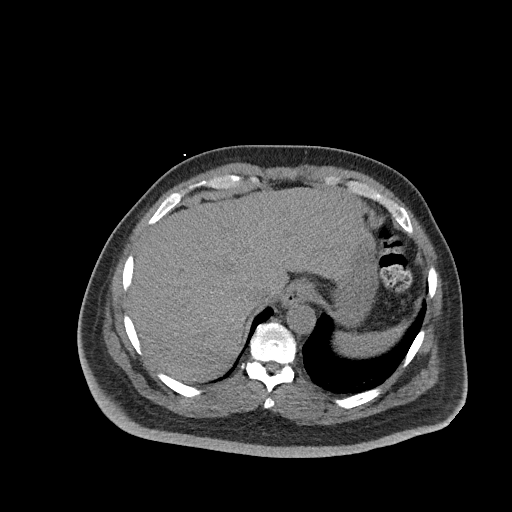
[im 94/98  soft-tissue]
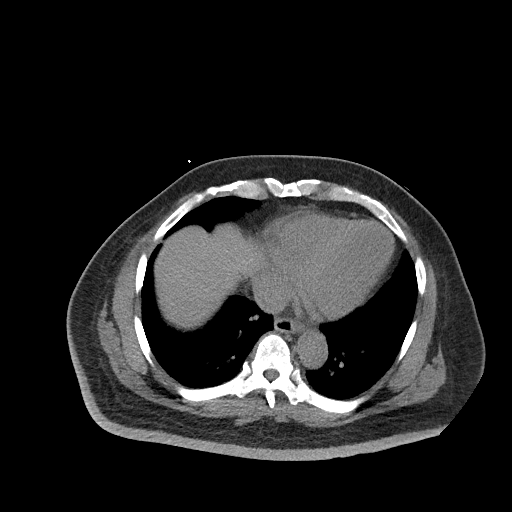

[Series 6: cor st · coronal · 0.83mm/px · 3 of 114 slices shown]
[im 38/114  soft-tissue]
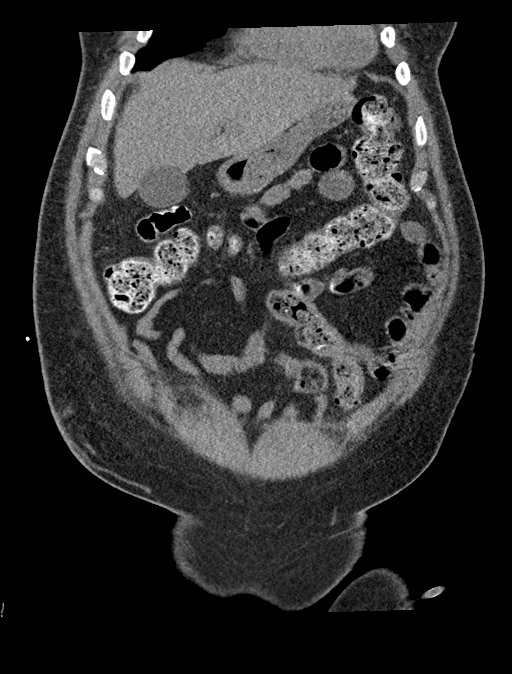
[im 51/114  soft-tissue]
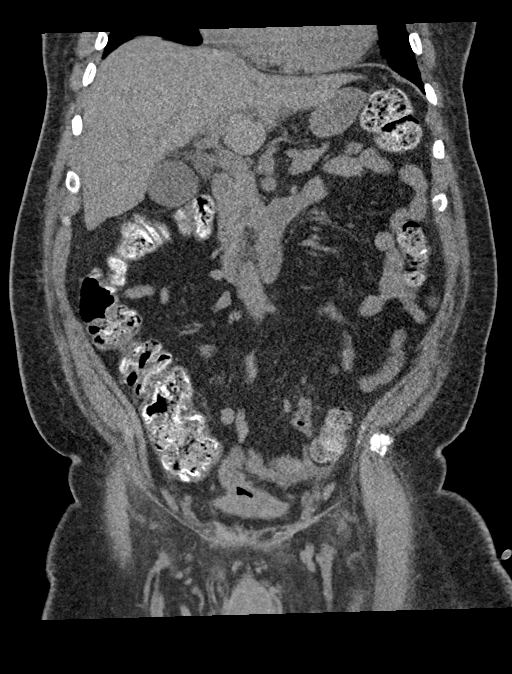
[im 63/114  soft-tissue]
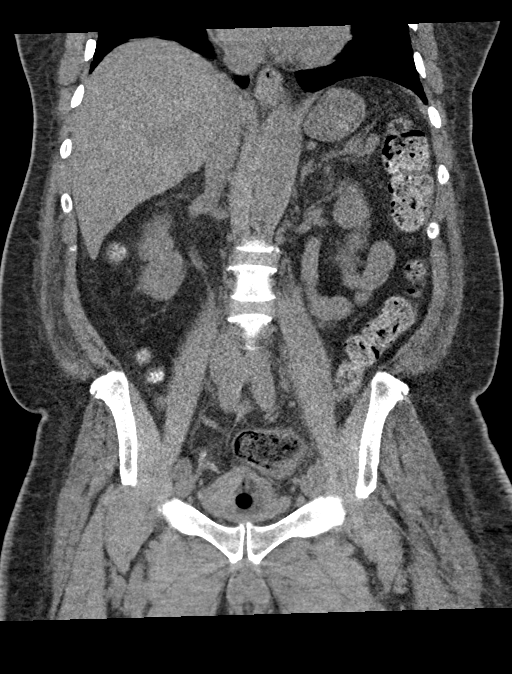

[16 of 46 positions shown; findings below may reference images not displayed]

FINDINGS: Lower chest: No acute abnormality.

Hepatobiliary: No focal liver abnormality is seen. Dependent
radiodense gallstones within the gallbladder. No gallbladder wall
thickening, or biliary dilatation.

Pancreas: Unremarkable. No pancreatic ductal dilatation or
surrounding inflammatory changes.

Spleen: Normal in size without focal abnormality.

Adrenals/Urinary Tract:

Adrenal glands are unremarkable. Kidneys are normal, without renal
calculi, focal lesion, or hydronephrosis.

Foley catheter paired distended urinary bladder, with focus of non
dependent intraluminal gas consistent with catheterization.

Stomach/Bowel: Stomach is within normal limits. Appendix appears
normal. No evidence of bowel wall thickening, distention, or
inflammatory changes. Radiodense stool within colon, likely
ingested.

Vascular/Lymphatic: No significant vascular findings are present. No
enlarged abdominal or pelvic lymph nodes.

Reproductive: Normal noncontrast appearance of prostate and seminal
vesicles.

Other: Subcutaneous contusions at right abdomen, likely injection
sites. No abdominal wall hernia or abnormality. No abdominopelvic
ascites.

Musculoskeletal: No acute or significant osseous findings.
IMPRESSION: Normal noncontrast appearance of the kidneys without radiodense
calculi or hydronephrosis.

If continued clinical concern for hematuria source, consider CT
urogram for further evaluation.

## 2021-10-08 ENCOUNTER — Inpatient Hospital Stay (HOSPITAL_COMMUNITY)
Admission: EM | Admit: 2021-10-08 | Discharge: 2021-10-19 | DRG: 683 | Disposition: A | Discharge status: Skilled Nursing Facility | Payer: Medicare Other | Attending: Internal Medicine | Admitting: Internal Medicine

## 2021-10-08 ENCOUNTER — Encounter (HOSPITAL_COMMUNITY): Payer: Self-pay | Admitting: Pharmacy Technician

## 2021-10-08 DIAGNOSIS — I129 Hypertensive chronic kidney disease with stage 1 through stage 4 chronic kidney disease, or unspecified chronic kidney disease: Secondary | ICD-10-CM | POA: Diagnosis present

## 2021-10-08 DIAGNOSIS — Z8673 Personal history of transient ischemic attack (TIA), and cerebral infarction without residual deficits: Secondary | ICD-10-CM

## 2021-10-08 DIAGNOSIS — K219 Gastro-esophageal reflux disease without esophagitis: Secondary | ICD-10-CM | POA: Diagnosis present

## 2021-10-08 DIAGNOSIS — N400 Enlarged prostate without lower urinary tract symptoms: Secondary | ICD-10-CM | POA: Diagnosis present

## 2021-10-08 DIAGNOSIS — E871 Hypo-osmolality and hyponatremia: Secondary | ICD-10-CM | POA: Diagnosis present

## 2021-10-08 DIAGNOSIS — D649 Anemia, unspecified: Secondary | ICD-10-CM | POA: Diagnosis present

## 2021-10-08 DIAGNOSIS — Z8249 Family history of ischemic heart disease and other diseases of the circulatory system: Secondary | ICD-10-CM

## 2021-10-08 DIAGNOSIS — Z89511 Acquired absence of right leg below knee: Secondary | ICD-10-CM

## 2021-10-08 DIAGNOSIS — N136 Pyonephrosis: Secondary | ICD-10-CM | POA: Diagnosis present

## 2021-10-08 DIAGNOSIS — N179 Acute kidney failure, unspecified: Principal | ICD-10-CM

## 2021-10-08 DIAGNOSIS — N184 Chronic kidney disease, stage 4 (severe): Secondary | ICD-10-CM | POA: Diagnosis not present

## 2021-10-08 DIAGNOSIS — D631 Anemia in chronic kidney disease: Secondary | ICD-10-CM | POA: Diagnosis not present

## 2021-10-08 DIAGNOSIS — E872 Acidosis, unspecified: Secondary | ICD-10-CM | POA: Diagnosis present

## 2021-10-08 DIAGNOSIS — I252 Old myocardial infarction: Secondary | ICD-10-CM | POA: Diagnosis not present

## 2021-10-08 DIAGNOSIS — R112 Nausea with vomiting, unspecified: Secondary | ICD-10-CM

## 2021-10-08 DIAGNOSIS — I2510 Atherosclerotic heart disease of native coronary artery without angina pectoris: Secondary | ICD-10-CM | POA: Diagnosis present

## 2021-10-08 DIAGNOSIS — B20 Human immunodeficiency virus [HIV] disease: Secondary | ICD-10-CM | POA: Diagnosis present

## 2021-10-08 DIAGNOSIS — B961 Klebsiella pneumoniae [K. pneumoniae] as the cause of diseases classified elsewhere: Secondary | ICD-10-CM | POA: Diagnosis present

## 2021-10-08 DIAGNOSIS — Z79891 Long term (current) use of opiate analgesic: Secondary | ICD-10-CM

## 2021-10-08 DIAGNOSIS — E785 Hyperlipidemia, unspecified: Secondary | ICD-10-CM | POA: Diagnosis present

## 2021-10-08 DIAGNOSIS — E1169 Type 2 diabetes mellitus with other specified complication: Secondary | ICD-10-CM

## 2021-10-08 DIAGNOSIS — E1122 Type 2 diabetes mellitus with diabetic chronic kidney disease: Secondary | ICD-10-CM | POA: Diagnosis present

## 2021-10-08 DIAGNOSIS — E869 Volume depletion, unspecified: Secondary | ICD-10-CM | POA: Diagnosis present

## 2021-10-08 DIAGNOSIS — Z5900 Homelessness unspecified: Secondary | ICD-10-CM

## 2021-10-08 DIAGNOSIS — Z79899 Other long term (current) drug therapy: Secondary | ICD-10-CM

## 2021-10-08 DIAGNOSIS — Z794 Long term (current) use of insulin: Secondary | ICD-10-CM | POA: Diagnosis not present

## 2021-10-08 DIAGNOSIS — Z955 Presence of coronary angioplasty implant and graft: Secondary | ICD-10-CM | POA: Diagnosis not present

## 2021-10-08 DIAGNOSIS — Z7984 Long term (current) use of oral hypoglycemic drugs: Secondary | ICD-10-CM

## 2021-10-08 DIAGNOSIS — Z6841 Body Mass Index (BMI) 40.0 and over, adult: Secondary | ICD-10-CM

## 2021-10-08 DIAGNOSIS — E1165 Type 2 diabetes mellitus with hyperglycemia: Secondary | ICD-10-CM | POA: Diagnosis present

## 2021-10-08 DIAGNOSIS — F419 Anxiety disorder, unspecified: Secondary | ICD-10-CM

## 2021-10-08 DIAGNOSIS — R739 Hyperglycemia, unspecified: Secondary | ICD-10-CM

## 2021-10-08 DIAGNOSIS — E782 Mixed hyperlipidemia: Secondary | ICD-10-CM | POA: Diagnosis not present

## 2021-10-08 DIAGNOSIS — Z91148 Patient's other noncompliance with medication regimen for other reason: Secondary | ICD-10-CM | POA: Diagnosis not present

## 2021-10-08 DIAGNOSIS — Z818 Family history of other mental and behavioral disorders: Secondary | ICD-10-CM | POA: Diagnosis not present

## 2021-10-08 DIAGNOSIS — E875 Hyperkalemia: Secondary | ICD-10-CM | POA: Diagnosis not present

## 2021-10-08 DIAGNOSIS — I10 Essential (primary) hypertension: Secondary | ICD-10-CM | POA: Diagnosis present

## 2021-10-08 DIAGNOSIS — Z882 Allergy status to sulfonamides status: Secondary | ICD-10-CM

## 2021-10-08 DIAGNOSIS — E669 Obesity, unspecified: Secondary | ICD-10-CM | POA: Diagnosis present

## 2021-10-08 DIAGNOSIS — Z89612 Acquired absence of left leg above knee: Secondary | ICD-10-CM

## 2021-10-08 DIAGNOSIS — Z809 Family history of malignant neoplasm, unspecified: Secondary | ICD-10-CM

## 2021-10-08 LAB — BETA-HYDROXYBUTYRIC ACID: Beta-Hydroxybutyric Acid: 0.09 mmol/L (ref 0.05–0.27)

## 2021-10-08 LAB — URINALYSIS, ROUTINE W REFLEX MICROSCOPIC
Bilirubin Urine: NEGATIVE
Glucose, UA: >=500 mg/dL — AB
Hgb urine dipstick: NEGATIVE
Ketones, ur: NEGATIVE mg/dL
Nitrite: NEGATIVE
Protein, ur: 100 mg/dL — AB
Specific Gravity, Urine: 1.017 (ref 1.005–1.030)
WBC, UA: >50 WBC/hpf — ABNORMAL HIGH (ref 0–5)
pH: 5 (ref 5.0–8.0)

## 2021-10-08 LAB — I-STAT VENOUS BLOOD GAS, ED
Acid-base deficit: 5 mmol/L — ABNORMAL HIGH (ref 0.0–2.0)
Bicarbonate: 18.5 mmol/L — ABNORMAL LOW (ref 20.0–28.0)
Calcium, Ion: 0.93 mmol/L — ABNORMAL LOW (ref 1.15–1.40)
HCT: 40 % (ref 39.0–52.0)
Hemoglobin: 13.6 g/dL (ref 13.0–17.0)
O2 Saturation: 100 %
Potassium: 4.8 mmol/L (ref 3.5–5.1)
Sodium: 123 mmol/L — ABNORMAL LOW (ref 135–145)
TCO2: 19 mmol/L — ABNORMAL LOW (ref 22–32)
pCO2, Ven: 28.1 mmHg — ABNORMAL LOW (ref 44–60)
pH, Ven: 7.426 (ref 7.25–7.43)
pO2, Ven: 190 mmHg — ABNORMAL HIGH (ref 32–45)

## 2021-10-08 LAB — BASIC METABOLIC PANEL
Anion gap: 11 (ref 5–15)
BUN: 47 mg/dL — ABNORMAL HIGH (ref 6–20)
CO2: 18 mmol/L — ABNORMAL LOW (ref 22–32)
Calcium: 8.3 mg/dL — ABNORMAL LOW (ref 8.9–10.3)
Chloride: 94 mmol/L — ABNORMAL LOW (ref 98–111)
Creatinine, Ser: 3.76 mg/dL — ABNORMAL HIGH (ref 0.61–1.24)
GFR, Estimated: 18 mL/min — ABNORMAL LOW (ref >60)
Glucose, Bld: 768 mg/dL (ref 70–99)
Potassium: 4.6 mmol/L (ref 3.5–5.1)
Sodium: 123 mmol/L — ABNORMAL LOW (ref 135–145)

## 2021-10-08 LAB — CBG MONITORING, ED
Glucose-Capillary: >600 mg/dL (ref 70–99)
Glucose-Capillary: 579 mg/dL (ref 70–99)
Glucose-Capillary: >600 mg/dL (ref 70–99)
Glucose-Capillary: >600 mg/dL (ref 70–99)
Glucose-Capillary: 436 mg/dL — ABNORMAL HIGH (ref 70–99)
Glucose-Capillary: 358 mg/dL — ABNORMAL HIGH (ref 70–99)

## 2021-10-08 LAB — CBC
HCT: 37.4 % — ABNORMAL LOW (ref 39.0–52.0)
Hemoglobin: 12.4 g/dL — ABNORMAL LOW (ref 13.0–17.0)
MCH: 30.1 pg (ref 26.0–34.0)
MCHC: 33.2 g/dL (ref 30.0–36.0)
MCV: 90.8 fL (ref 80.0–100.0)
Platelets: 247 10*3/uL (ref 150–400)
RBC: 4.12 MIL/uL — ABNORMAL LOW (ref 4.22–5.81)
RDW: 12.4 % (ref 11.5–15.5)
WBC: 8.1 10*3/uL (ref 4.0–10.5)
nRBC: 0 % (ref 0.0–0.2)

## 2021-10-08 MED ORDER — LACTATED RINGERS IV SOLN: INTRAVENOUS | Status: DC

## 2021-10-08 MED ORDER — LACTATED RINGERS IV BOLUS
20.0000 mL/kg | Freq: Once | INTRAVENOUS | Status: AC
Start: 2021-10-08 — End: 2021-10-08
  Administered 2021-10-08: 2496 mL via INTRAVENOUS

## 2021-10-08 MED ORDER — ACETAMINOPHEN 650 MG RE SUPP: 650.0000 mg | Freq: Four times a day (QID) | RECTAL | Status: DC | PRN

## 2021-10-08 MED ORDER — INSULIN REGULAR(HUMAN) IN NACL 100-0.9 UT/100ML-% IV SOLN
INTRAVENOUS | Status: DC
  Administered 2021-10-08: 11.5 [IU]/h via INTRAVENOUS
  Administered 2021-10-09: 2.8 [IU]/h via INTRAVENOUS
  Filled 2021-10-08 (×2): qty 100

## 2021-10-08 MED ORDER — ACETAMINOPHEN 325 MG PO TABS
650.0000 mg | ORAL_TABLET | Freq: Four times a day (QID) | ORAL | Status: DC | PRN
  Administered 2021-10-17: 325 mg via ORAL
  Filled 2021-10-08: qty 2

## 2021-10-08 MED ORDER — SODIUM CHLORIDE 0.45 % IV SOLN
INTRAVENOUS | Status: DC
  Filled 2021-10-08 (×2): qty 1000

## 2021-10-08 MED ORDER — DEXTROSE IN LACTATED RINGERS 5 % IV SOLN
INTRAVENOUS | Status: DC
Start: 2021-10-08 — End: 2021-10-08

## 2021-10-08 MED ORDER — ONDANSETRON HCL 4 MG/2ML IJ SOLN
4.0000 mg | Freq: Four times a day (QID) | INTRAMUSCULAR | Status: DC | PRN
  Administered 2021-10-14 (×2): 4 mg via INTRAVENOUS
  Filled 2021-10-08 (×2): qty 2

## 2021-10-08 MED ORDER — FENTANYL CITRATE PF 50 MCG/ML IJ SOSY: 50.0000 ug | PREFILLED_SYRINGE | INTRAMUSCULAR | Status: DC | PRN

## 2021-10-08 MED ORDER — LACTATED RINGERS IV BOLUS: 1000.0000 mL | Freq: Once | INTRAVENOUS | Status: DC

## 2021-10-08 MED ORDER — DEXTROSE 50 % IV SOLN: 0.0000 mL | INTRAVENOUS | Status: DC | PRN

## 2021-10-08 MED ORDER — NALOXONE HCL 0.4 MG/ML IJ SOLN: 0.4000 mg | INTRAMUSCULAR | Status: DC | PRN

## 2021-10-08 MED ORDER — KCL IN DEXTROSE-NACL 10-5-0.45 MEQ/L-%-% IV SOLN
INTRAVENOUS | Status: DC
  Filled 2021-10-08 (×3): qty 1000

## 2021-10-08 NOTE — ED Triage Notes (Signed)
Pt here with reports of his glucose being high as well as having hypertension. CBG reads HI in triage. Pt reports compliance with medications.

## 2021-10-08 NOTE — ED Provider Triage Note (Signed)
Emergency Medicine Provider Triage Evaluation Note  Keith [name] , a 56 y.o. male  was evaluated in triage.  Pt complains of high blood pressure, high blood sugar for 2 days.  He reports that he has taken his normal dose of insulin.  He is unsure why it is so poorly controlled.  He reports no significant dietary changes.  He thinks that is because of the heat.  Patient reports increased thirst, increased urination.  He denies lightheadedness, stomach pain, confusion.  Review of Systems  Positive: Hyperglycemia Negative: Chest pain, shortness of breath, confusion, abdominal pain  Physical Exam  BP 135/86 (BP Location: Right Arm)   Pulse 77   Temp 98.8 F (37.1 C) (Oral)   Resp 16   SpO2 97%  Gen:   Awake, no distress   Resp:  Normal effort  MSK:   Moves extremities without difficulty  Other:    Medical Decision Making  Medically screening exam initiated at 4:03 PM.  Appropriate orders placed.  Keith [name] was informed that the remainder of the evaluation will be completed by another provider, this initial triage assessment does not replace that evaluation, and the importance of remaining in the ED until their evaluation is complete.  Workup initiated   Anselmo Pickler, [place] 10/08/21 1605

## 2021-10-08 NOTE — ED Provider Notes (Signed)
[place] EMERGENCY DEPARTMENT Provider Note   CSN: [id number] Arrival date & time: 10/08/21  1447     History {Add pertinent medical, surgical, social history, OB history to HPI:1} Chief Complaint  Patient presents with   Hyperglycemia    Keith [name] is a 56 y.o. male.   Hyperglycemia   Patient is a 56 year old male with a history of HIV on HAART, H TN, IDDM, HLD, CKD IIIb, gout, obesity, s/p bilateral BKA, anxiety/depression, chronic narcotic use who presented to the emergency department for evaluation of hyperglycemia.  Patient reports that over the past 2 to 3 days he has been spending time outside in the heat.  He has been told by his primary care provider to spend more time inside given his history of diabetes.  Patient states that he has been taking 20 units of insulin twice per day, but he does not know how sliding scale insulin works.  Per old records patient most recent  Per old records patient was admitted to the hospital from 08/21/2021 through 08/24/2021 in the setting of AKI on CKD.     Home Medications Prior to Admission medications   Medication Sig Start Date End Date Taking? Authorizing Provider  allopurinol (ZYLOPRIM) 100 MG tablet Take 1 tablet (100 mg total) by mouth daily. 12/26/20 09/11/21  [name], MD  amLODipine (NORVASC) 10 MG tablet Take 10 mg by mouth daily.    [provider]  atorvastatin (LIPITOR) 80 MG tablet Take 1 tablet (80 mg total) by mouth daily. 09/01/20 01/02/22  [name], MD  blood glucose meter kit and supplies Dispense based on patient and insurance preference. Use up to four times daily as directed. (FOR ICD-10 E10.9, E11.9). 10/17/20   [name], NP  carvedilol (COREG) 3.125 MG tablet Take 1 tablet (3.125 mg total) by mouth 2 (two) times daily. 12/26/20 10/13/21  [name], MD  cephALEXin (KEFLEX) 500 MG capsule Take 500 mg by mouth 2 (two) times daily. For 6 days Patient not taking:  Reported on 09/11/2021    [provider]  dapsone 100 MG tablet TAKE 1 TABLET (100 MG TOTAL) BY MOUTH DAILY. 02/13/21 02/13/22  Golden Circle, FNP  dolutegravir-lamiVUDine (DOVATO) 50-300 MG tablet Take 1 tablet by mouth daily. 02/13/21   Golden Circle, FNP  doravirine (PIFELTRO) 100 MG TABS tablet Take 1 tablet (100 mg total) by mouth daily. 02/13/21   Golden Circle, FNP  escitalopram (LEXAPRO) 20 MG tablet Take 1 tablet (20 mg total) by mouth daily as needed. Patient not taking: Reported on 09/11/2021 02/13/21   Golden Circle, FNP  ferrous sulfate 325 (65 FE) MG tablet Take 1 tablet (325 mg total) by mouth every Monday, Wednesday, and Friday at 6 PM. Do not take with other medicines. Patient not taking: Reported on 09/11/2021 12/27/20 10/13/21  Elodia Florence., MD  finasteride (PROSCAR) 5 MG tablet Take 5 mg by mouth daily. 08/20/21   [provider]  gabapentin (NEURONTIN) 100 MG capsule Take 1 capsule (100 mg total) by mouth 3 (three) times daily. Patient not taking: Reported on 08/23/2021 09/01/20 01/02/22  [name], MD  gabapentin (NEURONTIN) 300 MG capsule Take 300 mg by mouth 3 (three) times daily.    [provider]  glucose blood (COOL BLOOD GLUCOSE TEST STRIPS) test strip Use as instructed 02/16/20   [name], FNP  icosapent Ethyl (VASCEPA) 1 g capsule TAKE 2 CAPSULES (2 G TOTAL) BY  MOUTH TWO TIMES DAILY. Patient taking differently: Take 2 g by mouth 2 (two) times daily. 03/27/20 10/13/21  [name], MD  insulin aspart (NOVOLOG) 100 UNIT/ML FlexPen Inject 10 Units into the skin 3 (three) times daily with meals. Patient not taking: Reported on 09/11/2021    [provider]  insulin glargine (LANTUS) 100 UNIT/ML Solostar Pen Inject 10 Units into the skin daily. Patient not taking: Reported on 09/11/2021    [provider]  Lancets [facility] ULTRASOFT) lancets Use as instructed 02/16/20   [name], FNP  lisinopril  (ZESTRIL) 10 MG tablet Take 10 mg by mouth daily.    [provider]  metFORMIN (GLUCOPHAGE) 1000 MG tablet Take 1,000 mg by mouth 2 (two) times daily with a meal.    [provider]  nitroGLYCERIN (NITROSTAT) 0.4 MG SL tablet Place 0.4 mg under the tongue every 5 (five) minutes as needed for chest pain.    [provider]  oxybutynin (DITROPAN) 5 MG tablet Take 1/2 tablet (2.5 mg total) by mouth 2 (two) times daily. 12/26/20   [name], MD  oxyCODONE-acetaminophen (PERCOCET) 10-325 MG tablet Take 1 tablet by mouth every 4 (four) hours as needed for pain. Patient taking differently: Take 1 tablet by mouth every 4 (four) hours as needed (severe pain). 01/10/21   [name], MD  pantoprazole (PROTONIX) 40 MG tablet TAKE 1 TABLET BY MOUTH EVERY DAY 09/13/21   [name], MD  sodium bicarbonate 650 MG tablet Take 1 tablet (650 mg total) by mouth 2 (two) times daily. 08/24/21   Raiford Noble Latif, DO      Allergies    Elemental sulfur and Sulfa antibiotics    Review of Systems   Review of Systems  Physical Exam Updated Vital Signs BP 131/87   Pulse 72   Temp 98.8 F (37.1 C) (Oral)   Resp 15   SpO2 99%  Physical Exam  ED Results / Procedures / Treatments   Labs (all labs ordered are listed, but only abnormal results are displayed) Labs Reviewed  CBC - Abnormal; Notable for the following components:      Result Value   RBC 4.12 (*)    Hemoglobin 12.4 (*)    HCT 37.4 (*)    All other components within normal limits  BASIC METABOLIC PANEL - Abnormal; Notable for the following components:   Sodium 123 (*)    Chloride 94 (*)    CO2 18 (*)    Glucose, Bld 768 (*)    BUN 47 (*)    Creatinine, Ser 3.76 (*)    Calcium 8.3 (*)    GFR, Estimated 18 (*)    All other components within normal limits  I-STAT VENOUS BLOOD GAS, ED - Abnormal; Notable for the following components:   pCO2, Ven 28.1 (*)    pO2, Ven 190 (*)    Bicarbonate 18.5 (*)     TCO2 19 (*)    Acid-base deficit 5.0 (*)    Sodium 123 (*)    Calcium, Ion 0.93 (*)    All other components within normal limits  BLOOD GAS, VENOUS  URINALYSIS, ROUTINE W REFLEX MICROSCOPIC  CBG MONITORING, ED    EKG None  Radiology No results found.  Procedures Procedures  {Document cardiac monitor, telemetry assessment procedure when appropriate:1}  Medications Ordered in ED Medications - No data to display  ED Course/ Medical Decision Making/ A&P  Medical Decision Making Amount and/or Complexity of Data Reviewed Labs: ordered.  Risk Prescription drug management.   ***  {Document critical care time when appropriate:1} {Document review of labs and clinical decision tools ie heart score, Chads2Vasc2 etc:1}  {Document your independent review of radiology images, and any outside records:1} {Document your discussion with family members, caretakers, and with consultants:1} {Document social determinants of health affecting pt's care:1} {Document your decision making why or why not admission, treatments were needed:1} Final Clinical Impression(s) / ED Diagnoses Final diagnoses:  None    Rx / DC Orders ED Discharge Orders     None

## 2021-10-09 ENCOUNTER — Inpatient Hospital Stay (HOSPITAL_COMMUNITY): Payer: Medicare Other

## 2021-10-09 ENCOUNTER — Encounter (HOSPITAL_COMMUNITY): Payer: Self-pay | Admitting: Internal Medicine

## 2021-10-09 ENCOUNTER — Other Ambulatory Visit: Payer: Self-pay

## 2021-10-09 DIAGNOSIS — R739 Hyperglycemia, unspecified: Secondary | ICD-10-CM | POA: Diagnosis not present

## 2021-10-09 DIAGNOSIS — N179 Acute kidney failure, unspecified: Secondary | ICD-10-CM | POA: Diagnosis not present

## 2021-10-09 DIAGNOSIS — N184 Chronic kidney disease, stage 4 (severe): Secondary | ICD-10-CM | POA: Diagnosis not present

## 2021-10-09 DIAGNOSIS — N400 Enlarged prostate without lower urinary tract symptoms: Secondary | ICD-10-CM | POA: Diagnosis present

## 2021-10-09 DIAGNOSIS — K219 Gastro-esophageal reflux disease without esophagitis: Secondary | ICD-10-CM | POA: Diagnosis present

## 2021-10-09 LAB — GLUCOSE, CAPILLARY
Glucose-Capillary: 182 mg/dL — ABNORMAL HIGH (ref 70–99)
Glucose-Capillary: 214 mg/dL — ABNORMAL HIGH (ref 70–99)
Glucose-Capillary: 363 mg/dL — ABNORMAL HIGH (ref 70–99)
Glucose-Capillary: 330 mg/dL — ABNORMAL HIGH (ref 70–99)

## 2021-10-09 LAB — CBG MONITORING, ED
Glucose-Capillary: 148 mg/dL — ABNORMAL HIGH (ref 70–99)
Glucose-Capillary: 148 mg/dL — ABNORMAL HIGH (ref 70–99)
Glucose-Capillary: 149 mg/dL — ABNORMAL HIGH (ref 70–99)
Glucose-Capillary: 158 mg/dL — ABNORMAL HIGH (ref 70–99)
Glucose-Capillary: 163 mg/dL — ABNORMAL HIGH (ref 70–99)
Glucose-Capillary: 182 mg/dL — ABNORMAL HIGH (ref 70–99)
Glucose-Capillary: 208 mg/dL — ABNORMAL HIGH (ref 70–99)
Glucose-Capillary: 208 mg/dL — ABNORMAL HIGH (ref 70–99)
Glucose-Capillary: 217 mg/dL — ABNORMAL HIGH (ref 70–99)
Glucose-Capillary: 355 mg/dL — ABNORMAL HIGH (ref 70–99)

## 2021-10-09 LAB — BASIC METABOLIC PANEL
Anion gap: 10 (ref 5–15)
Anion gap: 11 (ref 5–15)
Anion gap: 11 (ref 5–15)
BUN: 41 mg/dL — ABNORMAL HIGH (ref 6–20)
BUN: 44 mg/dL — ABNORMAL HIGH (ref 6–20)
BUN: 46 mg/dL — ABNORMAL HIGH (ref 6–20)
CO2: 25 mmol/L (ref 22–32)
CO2: 19 mmol/L — ABNORMAL LOW (ref 22–32)
CO2: 20 mmol/L — ABNORMAL LOW (ref 22–32)
Calcium: 8.9 mg/dL (ref 8.9–10.3)
Calcium: 8.8 mg/dL — ABNORMAL LOW (ref 8.9–10.3)
Calcium: 8.6 mg/dL — ABNORMAL LOW (ref 8.9–10.3)
Chloride: 101 mmol/L (ref 98–111)
Chloride: 105 mmol/L (ref 98–111)
Chloride: 104 mmol/L (ref 98–111)
Creatinine, Ser: 3.34 mg/dL — ABNORMAL HIGH (ref 0.61–1.24)
Creatinine, Ser: 3.31 mg/dL — ABNORMAL HIGH (ref 0.61–1.24)
Creatinine, Ser: 3.39 mg/dL — ABNORMAL HIGH (ref 0.61–1.24)
GFR, Estimated: 21 mL/min — ABNORMAL LOW (ref >60)
GFR, Estimated: 21 mL/min — ABNORMAL LOW (ref >60)
GFR, Estimated: 20 mL/min — ABNORMAL LOW (ref >60)
Glucose, Bld: 176 mg/dL — ABNORMAL HIGH (ref 70–99)
Glucose, Bld: 198 mg/dL — ABNORMAL HIGH (ref 70–99)
Glucose, Bld: 328 mg/dL — ABNORMAL HIGH (ref 70–99)
Potassium: 4 mmol/L (ref 3.5–5.1)
Potassium: 3.9 mmol/L (ref 3.5–5.1)
Potassium: 4.7 mmol/L (ref 3.5–5.1)
Sodium: 136 mmol/L (ref 135–145)
Sodium: 135 mmol/L (ref 135–145)
Sodium: 135 mmol/L (ref 135–145)

## 2021-10-09 LAB — MAGNESIUM: Magnesium: 1.8 mg/dL (ref 1.7–2.4)

## 2021-10-09 LAB — CBC WITH DIFFERENTIAL/PLATELET
Abs Immature Granulocytes: 0.04 10*3/uL (ref 0.00–0.07)
Basophils Absolute: 0.1 10*3/uL (ref 0.0–0.1)
Basophils Relative: 1 %
Eosinophils Absolute: 0.3 10*3/uL (ref 0.0–0.5)
Eosinophils Relative: 4 %
HCT: 36.4 % — ABNORMAL LOW (ref 39.0–52.0)
Hemoglobin: 12.2 g/dL — ABNORMAL LOW (ref 13.0–17.0)
Immature Granulocytes: 1 %
Lymphocytes Relative: 16 %
Lymphs Abs: 1.4 10*3/uL (ref 0.7–4.0)
MCH: 29.8 pg (ref 26.0–34.0)
MCHC: 33.5 g/dL (ref 30.0–36.0)
MCV: 88.8 fL (ref 80.0–100.0)
Monocytes Absolute: 0.7 10*3/uL (ref 0.1–1.0)
Monocytes Relative: 8 %
Neutro Abs: 6 10*3/uL (ref 1.7–7.7)
Neutrophils Relative %: 70 %
Platelets: 244 10*3/uL (ref 150–400)
RBC: 4.1 MIL/uL — ABNORMAL LOW (ref 4.22–5.81)
RDW: 12.2 % (ref 11.5–15.5)
WBC: 8.5 10*3/uL (ref 4.0–10.5)
nRBC: 0 % (ref 0.0–0.2)

## 2021-10-09 LAB — OSMOLALITY: Osmolality: 303 mOsm/kg — ABNORMAL HIGH (ref 275–295)

## 2021-10-09 LAB — CK: Total CK: 325 U/L (ref 49–397)

## 2021-10-09 LAB — MRSA NEXT GEN BY PCR, NASAL: MRSA by PCR Next Gen: DETECTED — AB

## 2021-10-09 MED ORDER — SODIUM BICARBONATE 650 MG PO TABS
650.0000 mg | ORAL_TABLET | Freq: Two times a day (BID) | ORAL | Status: DC
  Administered 2021-10-09 – 2021-10-19 (×22): 650 mg via ORAL
  Filled 2021-10-09 (×22): qty 1

## 2021-10-09 MED ORDER — OXYCODONE HCL 5 MG PO TABS
5.0000 mg | ORAL_TABLET | ORAL | Status: DC | PRN
  Administered 2021-10-09 – 2021-10-19 (×12): 5 mg via ORAL
  Filled 2021-10-09 (×14): qty 1

## 2021-10-09 MED ORDER — CARVEDILOL 3.125 MG PO TABS
3.1250 mg | ORAL_TABLET | Freq: Two times a day (BID) | ORAL | Status: DC
  Administered 2021-10-09 – 2021-10-19 (×21): 3.125 mg via ORAL
  Filled 2021-10-09 (×21): qty 1

## 2021-10-09 MED ORDER — ICOSAPENT ETHYL 1 G PO CAPS
2.0000 g | ORAL_CAPSULE | Freq: Two times a day (BID) | ORAL | Status: DC
  Administered 2021-10-09 – 2021-10-19 (×22): 2 g via ORAL
  Filled 2021-10-09 (×22): qty 2

## 2021-10-09 MED ORDER — ATORVASTATIN CALCIUM 80 MG PO TABS
80.0000 mg | ORAL_TABLET | Freq: Every day | ORAL | Status: DC
  Administered 2021-10-09 – 2021-10-19 (×11): 80 mg via ORAL
  Filled 2021-10-09 (×7): qty 1
  Filled 2021-10-09: qty 2
  Filled 2021-10-09 (×3): qty 1

## 2021-10-09 MED ORDER — DORAVIRINE 100 MG PO TABS
100.0000 mg | ORAL_TABLET | Freq: Every day | ORAL | Status: DC
  Administered 2021-10-09 – 2021-10-19 (×11): 100 mg via ORAL
  Filled 2021-10-09 (×11): qty 1

## 2021-10-09 MED ORDER — PANTOPRAZOLE SODIUM 40 MG PO TBEC
40.0000 mg | DELAYED_RELEASE_TABLET | Freq: Every day | ORAL | Status: DC
  Administered 2021-10-09 – 2021-10-19 (×11): 40 mg via ORAL
  Filled 2021-10-09 (×11): qty 1

## 2021-10-09 MED ORDER — INSULIN ASPART 100 UNIT/ML FLEXPEN: 3.0000 [IU] | PEN_INJECTOR | Freq: Three times a day (TID) | SUBCUTANEOUS | Status: DC

## 2021-10-09 MED ORDER — FERROUS SULFATE 325 (65 FE) MG PO TABS
325.0000 mg | ORAL_TABLET | ORAL | Status: DC
  Administered 2021-10-10 – 2021-10-19 (×5): 325 mg via ORAL
  Filled 2021-10-09 (×7): qty 1

## 2021-10-09 MED ORDER — HEPARIN SODIUM (PORCINE) 5000 UNIT/ML IJ SOLN
5000.0000 [IU] | Freq: Three times a day (TID) | INTRAMUSCULAR | Status: DC
  Administered 2021-10-09 – 2021-10-19 (×32): 5000 [IU] via SUBCUTANEOUS
  Filled 2021-10-09 (×32): qty 1

## 2021-10-09 MED ORDER — AMLODIPINE BESYLATE 10 MG PO TABS
10.0000 mg | ORAL_TABLET | Freq: Every day | ORAL | Status: DC
Start: 2021-10-09 — End: 2021-10-14
  Administered 2021-10-09 – 2021-10-14 (×6): 10 mg via ORAL
  Filled 2021-10-09 (×6): qty 1

## 2021-10-09 MED ORDER — LACTATED RINGERS IV SOLN: INTRAVENOUS | Status: DC

## 2021-10-09 MED ORDER — ORAL CARE MOUTH RINSE
15.0000 mL | OROMUCOSAL | Status: DC | PRN
Start: 2021-10-09 — End: 2021-10-20

## 2021-10-09 MED ORDER — INSULIN ASPART 100 UNIT/ML IJ SOLN
0.0000 [IU] | Freq: Every day | INTRAMUSCULAR | Status: DC
  Administered 2021-10-09 – 2021-10-10 (×2): 4 [IU] via SUBCUTANEOUS
  Administered 2021-10-11: 3 [IU] via SUBCUTANEOUS
  Administered 2021-10-12: 2 [IU] via SUBCUTANEOUS
  Administered 2021-10-13 – 2021-10-15 (×2): 4 [IU] via SUBCUTANEOUS
  Administered 2021-10-16: 3 [IU] via SUBCUTANEOUS
  Administered 2021-10-17 – 2021-10-18 (×2): 2 [IU] via SUBCUTANEOUS

## 2021-10-09 MED ORDER — DOLUTEGRAVIR-LAMIVUDINE 50-300 MG PO TABS
1.0000 | ORAL_TABLET | Freq: Every day | ORAL | Status: DC
Start: 2021-10-09 — End: 2021-10-20
  Administered 2021-10-09 – 2021-10-19 (×11): 1 via ORAL
  Filled 2021-10-09 (×11): qty 1

## 2021-10-09 MED ORDER — FINASTERIDE 5 MG PO TABS
5.0000 mg | ORAL_TABLET | Freq: Every day | ORAL | Status: DC
  Administered 2021-10-09 – 2021-10-19 (×11): 5 mg via ORAL
  Filled 2021-10-09 (×11): qty 1

## 2021-10-09 MED ORDER — CHLORHEXIDINE GLUCONATE CLOTH 2 % EX PADS
6.0000 | MEDICATED_PAD | Freq: Every day | CUTANEOUS | Status: AC
  Administered 2021-10-10 – 2021-10-14 (×5): 6 via TOPICAL

## 2021-10-09 MED ORDER — OXYCODONE-ACETAMINOPHEN 5-325 MG PO TABS
1.0000 | ORAL_TABLET | ORAL | Status: DC | PRN
  Administered 2021-10-09 – 2021-10-19 (×13): 1 via ORAL
  Filled 2021-10-09 (×15): qty 1

## 2021-10-09 MED ORDER — INSULIN GLARGINE-YFGN 100 UNIT/ML ~~LOC~~ SOLN
10.0000 [IU] | Freq: Every day | SUBCUTANEOUS | Status: DC
  Administered 2021-10-09 – 2021-10-12 (×4): 10 [IU] via SUBCUTANEOUS
  Filled 2021-10-09 (×4): qty 0.1

## 2021-10-09 MED ORDER — INSULIN ASPART 100 UNIT/ML IJ SOLN
0.0000 [IU] | Freq: Three times a day (TID) | INTRAMUSCULAR | Status: DC
  Administered 2021-10-09: 9 [IU] via SUBCUTANEOUS
  Administered 2021-10-10: 7 [IU] via SUBCUTANEOUS
  Administered 2021-10-10: 3 [IU] via SUBCUTANEOUS
  Administered 2021-10-10: 7 [IU] via SUBCUTANEOUS
  Administered 2021-10-11: 2 [IU] via SUBCUTANEOUS
  Administered 2021-10-11 – 2021-10-12 (×5): 3 [IU] via SUBCUTANEOUS
  Administered 2021-10-13: 5 [IU] via SUBCUTANEOUS
  Administered 2021-10-13: 1 [IU] via SUBCUTANEOUS
  Administered 2021-10-13 – 2021-10-14 (×2): 3 [IU] via SUBCUTANEOUS
  Administered 2021-10-14: 1 [IU] via SUBCUTANEOUS
  Administered 2021-10-14: 3 [IU] via SUBCUTANEOUS
  Administered 2021-10-15: 2 [IU] via SUBCUTANEOUS
  Administered 2021-10-15: 3 [IU] via SUBCUTANEOUS
  Administered 2021-10-15 – 2021-10-16 (×3): 5 [IU] via SUBCUTANEOUS
  Administered 2021-10-16: 3 [IU] via SUBCUTANEOUS
  Administered 2021-10-17: 2 [IU] via SUBCUTANEOUS
  Administered 2021-10-17: 3 [IU] via SUBCUTANEOUS
  Administered 2021-10-17 – 2021-10-18 (×2): 2 [IU] via SUBCUTANEOUS
  Administered 2021-10-18: 1 [IU] via SUBCUTANEOUS

## 2021-10-09 MED ORDER — OXYCODONE-ACETAMINOPHEN 10-325 MG PO TABS: 1.0000 | ORAL_TABLET | ORAL | Status: DC | PRN

## 2021-10-09 MED ORDER — DAPSONE 100 MG PO TABS
100.0000 mg | ORAL_TABLET | Freq: Every day | ORAL | Status: DC
  Administered 2021-10-09 – 2021-10-19 (×11): 100 mg via ORAL
  Filled 2021-10-09 (×11): qty 1

## 2021-10-09 MED ORDER — OXYBUTYNIN CHLORIDE 5 MG PO TABS
2.5000 mg | ORAL_TABLET | Freq: Two times a day (BID) | ORAL | Status: DC
Start: 2021-10-09 — End: 2021-10-12
  Administered 2021-10-09 – 2021-10-12 (×6): 2.5 mg via ORAL
  Filled 2021-10-09 (×6): qty 1

## 2021-10-09 MED ORDER — INSULIN ASPART 100 UNIT/ML IJ SOLN
3.0000 [IU] | Freq: Three times a day (TID) | INTRAMUSCULAR | Status: DC
  Administered 2021-10-09 – 2021-10-16 (×19): 3 [IU] via SUBCUTANEOUS

## 2021-10-09 MED ORDER — GABAPENTIN 100 MG PO CAPS
100.0000 mg | ORAL_CAPSULE | Freq: Three times a day (TID) | ORAL | Status: DC
  Administered 2021-10-09 – 2021-10-19 (×32): 100 mg via ORAL
  Filled 2021-10-09 (×33): qty 1

## 2021-10-09 MED ORDER — MUPIROCIN 2 % EX OINT
1.0000 | TOPICAL_OINTMENT | Freq: Two times a day (BID) | CUTANEOUS | Status: AC
  Administered 2021-10-09 – 2021-10-14 (×10): 1 via NASAL
  Filled 2021-10-09 (×2): qty 22

## 2021-10-09 NOTE — H&P (Signed)
History and Physical    PLEASE NOTE THAT DRAGON DICTATION SOFTWARE WAS USED IN THE CONSTRUCTION OF THIS NOTE.   Keith [name] VFI:[id number] DOB: July 26, 1965 DOA: 10/08/2021  [name], NP  Patient coming from: home   I have personally briefly reviewed patient's old medical records in [place]  Chief Complaint: Elevated blood sugar  HPI: Keith [name] is a 56 y.o. male with medical history significant for HIV/AIDS on antiviral therapy, hypertension, hyperlipidemia stage IV CKD with baseline creatinine 2.83.4, type 2 diabetes mellitus anemia of chronic disease associated with baseline hemoglobin range 10-12, BPH, who is admitted to [facility] on 10/08/2021 with hyperglycemia after presenting from home to [facility] ED complaining of elevated blood sugar.   Over the last 1 to 2 days, the patient reports increase in his outpatient blood sugar via home glucometer, noting a reading of "high" over the last day, prompting him to present to [facility] emergency department for further evaluation management thereof.  He notes that he has been spending a significant amount of time outside over the last 2 to 3 days, including during the hottest parts of the day with temperatures consistently greater than 90 F over that timeframe.  He notes no significant increase in his consumption of water over the last few days to compensate for elevated ambient temperatures.  Denies any associated chest pain, palpitations, diaphoresis, shortness of breath, dizziness, presyncope, or syncope.  He also denies any recent nausea, vomiting, abdominal pain, diarrhea, melena, hematochezia, rash.  Not associate with any headache, neck stiffness, cough, dysuria or gross hematuria.  Also denies any recent subjective fever, chills, rigors, or generalized myalgias. Denies any recent acute focal weakness, acute focal numbness, paresthesias, facial droop, slurred speech, expressive aphasia, acute change in vision,  dysphagia, vertigo.  He confirms a history of type 2 diabetes mellitus, and notes good compliance with his outpatient basal insulin, noting that he is on Lantus 20 units SQ twice daily.  However, he acknowledges that he is prescribed sliding scale NovoLog 3 times daily with meals, but conveys suboptimal compliance in taking his short acting insulin due to some associated confusion in how to appropriately dose the NovoLog on a sliding scale.  He conveys that he is amenable to additional education to try to optimize his short acting insulin management.  Denies any recent alcohol consumption, denies any history of recreational drug use.  Most recent hemoglobin A1c was noted to be 6.0% when checked on 08/17/2021.  He was also hospitalized in mid May 2023 for a similar presentation, in which he was admitted for hyperglycemia in the absence of DKA, noting acute kidney injury superimposed on CKD stage IV at that time.  Medical history also notable for HIV/AIDS, with most recent CD4 count noted to be 129 when checked on 08/17/2021.  He is on Dovato and Doravirine as an outpatient, confirming good compliance with these medications.     ED Course:  Vital signs in the ED were notable for the following: Afebrile; heart rate 60-72; blood pressure 131/87 - 151/97; respiratory 14-20, oxygen saturation 9700% on room air.  Labs were notable for the following: CBG greater than 600.  VBG 7.426; beta hydroxybutyric acid 0.09.  BMP notable for the following: Sodium 123, which corrects to approximately 133.5 when taking into account concomitant hyperglycemia, potassium 4.6, bicarbonate 18, anion gap 11, creatinine 3.76, glucose 768.  CBC notable for white blood cell count 8100, hemoglobin 12.4.  Most recent prior value of  11.4 on 09/10/2021.  Urinalysis has been ordered, with result currently pending.  Imaging and additional notable ED work-up: EKG, relative to most recent prior from 08/22/2021 shows sinus rhythm with heart  rate 78, normal intervals, no evidence of T wave changes, less than 1 mm ST elevation limited to lead III, which appears unchanged from most recent prior EKG, and otherwise demonstrates no evidence of interval ST changes.  While in the ED, the following were administered: LR bolus x2.5 L followed by initiation continuous LR at 125 cc/h.  Insulin drip initiated.  Subsequently, the patient was admitted for further evaluation management of hyperglycemia in the absence of evidence of DKA and without overt evidence of HHNK, with presentation also notable for AKI superimposed on CKD stage IV.    Review of Systems: As per HPI otherwise 10 point review of systems negative.   Past Medical History:  Diagnosis Date   Abscess of right foot    abscess/ulcer of R transtibial amputation requiring IV abx   AIDS (HCC)    Anemia    CAD (coronary artery disease)    a. MI with stenting of OM1 in 11/2018 with residual disease. b. acute STEMI 03/2020 s/p DES to OM1   Chronic anemia    Chronic knee pain    right   Chronic pain    CKD (chronic kidney disease), stage IV (HCC)    CVA (cerebral vascular accident) (HCC) 04/2020   Diabetes type 2, uncontrolled    HgA1c 17.6 (04/27/2010)   Diabetic foot ulcer (HCC) 01/2017   right foot   Dilatation of aorta (HCC)    Erectile dysfunction    Genital warts    GERD (gastroesophageal reflux disease)    History of blood transfusion    HIV (human immunodeficiency virus infection) (HCC) 2009   CD4 count 100, VL 13800 (05/01/2010)   Hyperlipidemia    Hypertension    Myocardial infarction (HCC)    Neuropathy    Noncompliance with medication regimen    Osteomyelitis (HCC)    h/o hand   Osteomyelitis of metatarsal (HCC) 04/28/2017   Pneumonia     Past Surgical History:  Procedure Laterality Date   AMPUTATION Right 05/02/2017   Procedure: AMPUTATION TRANSMETARSAL;  Surgeon: Nadara Mustard, MD;  Location: [facility] OR;  Service: Orthopedics;  Laterality: Right;    AMPUTATION Right 06/13/2017   Procedure: RIGHT BELOW KNEE AMPUTATION;  Surgeon: Nadara Mustard, MD;  Location: [facility] OR;  Service: Orthopedics;  Laterality: Right;   AMPUTATION Left 10/27/2020   Procedure: LEFT BELOW KNEE AMPUTATION;  Surgeon: Nadara Mustard, MD;  Location: [facility] OR;  Service: Orthopedics;  Laterality: Left;   AMPUTATION Left 11/17/2020   Procedure: REVISION AMPUTATION BELOW KNEE, LEFT;  Surgeon: Nadara Mustard, MD;  Location: [facility] OR;  Service: Orthopedics;  Laterality: Left;   AMPUTATION Left 01/03/2021   Procedure: LEFT ABOVE KNEE AMPUTATION;  Surgeon: Nadara Mustard, MD;  Location: [facility] OR;  Service: Orthopedics;  Laterality: Left;   APPLICATION OF WOUND VAC Left 10/27/2020   Procedure: APPLICATION OF WOUND VAC;  Surgeon: Nadara Mustard, MD;  Location: [facility] OR;  Service: Orthopedics;  Laterality: Left;   BELOW KNEE LEG AMPUTATION Right 06/13/2017   BELOW KNEE LEG AMPUTATION     CORONARY STENT INTERVENTION N/A 11/10/2018   Procedure: CORONARY STENT INTERVENTION;  Surgeon: Marykay Lex, MD;  Location: [facility] INVASIVE CV LAB;  Service: Cardiovascular;  Laterality: N/A;   CORONARY/GRAFT ACUTE MI REVASCULARIZATION N/A 03/21/2020  Procedure: Coronary/Graft Acute MI Revascularization;  Surgeon: Runell Gess, MD;  Location: [facility] INVASIVE CV LAB;  Service: Cardiovascular;  Laterality: N/A;   CYSTOSCOPY N/A 11/09/2020   Procedure: CYSTOSCOPY, EVACUATION OF CLOTS, FULGERATION OF BLADDER AND BILATERIAL RETROGRADE PYELOGRAMS;  Surgeon: Jannifer Hick, MD;  Location: [facility] OR;  Service: Urology;  Laterality: N/A;   HERNIA REPAIR     I & D EXTREMITY Left 08/21/2014   Procedure: INCISION AND DRAINAGE LEFT SMALL FINGER;  Surgeon: Betha Loa, MD;  Location: [facility] OR;  Service: Orthopedics;  Laterality: Left;   I & D EXTREMITY Right 03/18/2017   Procedure: IRRIGATION AND DEBRIDEMENT EXTREMITY;  Surgeon: Nadara Mustard, MD;  Location: [facility] OR;  Service: Orthopedics;  Laterality: Right;   IR FLUORO GUIDE CV LINE RIGHT   11/02/2020   IR REMOVAL TUN CV CATH W/O FL  11/13/2020   IR US GUIDE VASC ACCESS RIGHT  11/02/2020   LEFT HEART CATH AND CORONARY ANGIOGRAPHY N/A 11/10/2018   Procedure: LEFT HEART CATH AND CORONARY ANGIOGRAPHY;  Surgeon: Marykay Lex, MD;  Location: [facility] INVASIVE CV LAB;  Service: Cardiovascular;  Laterality: N/A;   LEFT HEART CATH AND CORONARY ANGIOGRAPHY N/A 03/21/2020   Procedure: LEFT HEART CATH AND CORONARY ANGIOGRAPHY;  Surgeon: Runell Gess, MD;  Location: [facility] INVASIVE CV LAB;  Service: Cardiovascular;  Laterality: N/A;   MINOR IRRIGATION AND DEBRIDEMENT OF WOUND Right 04/22/2014   Procedure: IRRIGATION AND DEBRIDEMENT OF RIGHT NECK ABCESS;  Surgeon: Osborn Coho, MD;  Location: [facility] OR;  Service: ENT;  Laterality: Right;   MULTIPLE EXTRACTIONS WITH ALVEOLOPLASTY N/A 01/18/2013   Procedure: MULTIPLE EXTRACION 3, 6, 7, 10, 11, 13, 21, 22, 27, 28, 29, 30 WITH ALVEOLOPLASTY;  Surgeon: Georgia Lopes, DDS;  Location: [facility] OR;  Service: Oral Surgery;  Laterality: N/A;   SKIN SPLIT GRAFT Right 03/21/2017   Procedure: IRRIGATION AND DEBRIDEMENT RIGHT FOOT AND APPLY SPLIT THICKNESS SKIN GRAFT AND WOUND VAC;  Surgeon: Nadara Mustard, MD;  Location: [facility] OR;  Service: Orthopedics;  Laterality: Right;   STUMP REVISION Left 12/02/2020   Procedure: REVISION LEFT BELOW KNEE AMPUTATION;  Surgeon: Nadara Mustard, MD;  Location: [facility] OR;  Service: Orthopedics;  Laterality: Left;   TEE WITHOUT CARDIOVERSION N/A 05/02/2017   Procedure: TRANSESOPHAGEAL ECHOCARDIOGRAM (TEE);  Surgeon: Elease Hashimoto Deloris Ping, MD;  Location: [facility] OR;  Service: Cardiovascular;  Laterality: N/A;  coincidental to orthopedic case    Social History:  reports that he has never smoked. He has never used smokeless tobacco. He reports that he does not currently use alcohol. He reports that he does not use drugs.   Allergies  Allergen Reactions   Elemental Sulfur Itching    Patient stated he's allergic to "sulfur" AND "sulfa"   Sulfa Antibiotics  Itching    Family History  Problem Relation Age of Onset   Hypertension Mother    Arthritis Father    Hypertension Father    Hypertension Brother    Cancer Maternal Grandmother 90       unknown type of cancer   Depression Paternal Grandmother     Family history reviewed and not pertinent    Prior to Admission medications   Medication Sig Start Date End Date Taking? Authorizing Provider  allopurinol (ZYLOPRIM) 100 MG tablet Take 1 tablet (100 mg total) by mouth daily. 12/26/20 09/11/21  Standley Brooking, MD  amLODipine (NORVASC) 10 MG tablet Take 10 mg by mouth daily.    [provider]  atorvastatin (LIPITOR) 80 MG tablet Take 1 tablet (80 mg total) by mouth daily. 09/01/20 01/02/22  [name], MD  blood glucose meter kit and supplies Dispense based on patient and insurance preference. Use up to four times daily as directed. (FOR ICD-10 E10.9, E11.9). 10/17/20   [name], NP  carvedilol (COREG) 3.125 MG tablet Take 1 tablet (3.125 mg total) by mouth 2 (two) times daily. 12/26/20 10/13/21  [name], MD  cephALEXin (KEFLEX) 500 MG capsule Take 500 mg by mouth 2 (two) times daily. For 6 days Patient not taking: Reported on 09/11/2021    [provider]  dapsone 100 MG tablet TAKE 1 TABLET (100 MG TOTAL) BY MOUTH DAILY. 02/13/21 02/13/22  Golden Circle, FNP  dolutegravir-lamiVUDine (DOVATO) 50-300 MG tablet Take 1 tablet by mouth daily. 02/13/21   Golden Circle, FNP  doravirine (PIFELTRO) 100 MG TABS tablet Take 1 tablet (100 mg total) by mouth daily. 02/13/21   Golden Circle, FNP  escitalopram (LEXAPRO) 20 MG tablet Take 1 tablet (20 mg total) by mouth daily as needed. Patient not taking: Reported on 09/11/2021 02/13/21   Golden Circle, FNP  ferrous sulfate 325 (65 FE) MG tablet Take 1 tablet (325 mg total) by mouth every Monday, Wednesday, and Friday at 6 PM. Do not take with other medicines. Patient not taking: Reported on 09/11/2021 12/27/20 10/13/21   Elodia Florence., MD  finasteride (PROSCAR) 5 MG tablet Take 5 mg by mouth daily. 08/20/21   [provider]  gabapentin (NEURONTIN) 100 MG capsule Take 1 capsule (100 mg total) by mouth 3 (three) times daily. Patient not taking: Reported on 08/23/2021 09/01/20 01/02/22  [name], MD  gabapentin (NEURONTIN) 300 MG capsule Take 300 mg by mouth 3 (three) times daily.    [provider]  glucose blood (COOL BLOOD GLUCOSE TEST STRIPS) test strip Use as instructed 02/16/20   [name], FNP  icosapent Ethyl (VASCEPA) 1 g capsule TAKE 2 CAPSULES (2 G TOTAL) BY MOUTH TWO TIMES DAILY. Patient taking differently: Take 2 g by mouth 2 (two) times daily. 03/27/20 10/13/21  [name], MD  insulin aspart (NOVOLOG) 100 UNIT/ML FlexPen Inject 10 Units into the skin 3 (three) times daily with meals. Patient not taking: Reported on 09/11/2021    [provider]  insulin glargine (LANTUS) 100 UNIT/ML Solostar Pen Inject 10 Units into the skin daily. Patient not taking: Reported on 09/11/2021    [provider]  Lancets [facility] ULTRASOFT) lancets Use as instructed 02/16/20   [name], FNP  lisinopril (ZESTRIL) 10 MG tablet Take 10 mg by mouth daily.    [provider]  metFORMIN (GLUCOPHAGE) 1000 MG tablet Take 1,000 mg by mouth 2 (two) times daily with a meal.    [provider]  nitroGLYCERIN (NITROSTAT) 0.4 MG SL tablet Place 0.4 mg under the tongue every 5 (five) minutes as needed for chest pain.    [provider]  oxybutynin (DITROPAN) 5 MG tablet Take 1/2 tablet (2.5 mg total) by mouth 2 (two) times daily. 12/26/20   [name], MD  oxyCODONE-acetaminophen (PERCOCET) 10-325 MG tablet Take 1 tablet by mouth every 4 (four) hours as needed for pain. Patient taking differently: Take 1 tablet by mouth every 4 (four) hours as needed (severe pain). 01/10/21   [name], MD  pantoprazole (PROTONIX) 40 MG tablet TAKE 1  TABLET BY MOUTH EVERY DAY 09/13/21   [name],  Doroteo Bradford, MD  sodium bicarbonate 650 MG tablet Take 1 tablet (650 mg total) by mouth 2 (two) times daily. 08/24/21   Kerney Elbe, DO     Objective    Physical Exam: Vitals:   10/08/21 1915 10/08/21 1930 10/08/21 1945 10/09/21 0030  BP:  (!) 151/97 (!) 181/109 123/81  Pulse: 67 67 71 67  Resp: $Remo'12 20 14 12  'ktQqU$ Temp:      TempSrc:      SpO2: 98% 98% 96% 98%  Weight:        General: appears to be stated age; alert, oriented Skin: warm, dry, no rash Head:  AT/[place] Mouth:  Oral mucosa membranes appear dry, normal dentition Neck: supple; trachea midline Heart:  RRR; did not appreciate any M/R/G Lungs: CTAB, did not appreciate any wheezes, rales, or rhonchi Abdomen: + BS; soft, ND, NT Vascular: (Bilateral BKA noted); 2+ radial pulses b/l Extremities: Bilateral BKA noted, no muscle wasting Neuro: strength and sensation intact in upper and lower extremities b/l     Labs on Admission: I have personally reviewed following labs and imaging studies  CBC: Recent Labs  Lab 10/08/21 1620 10/08/21 1743  WBC 8.1  --   HGB 12.4* 13.6  HCT 37.4* 40.0  MCV 90.8  --   PLT 247  --    Basic Metabolic Panel: Recent Labs  Lab 10/08/21 1620 10/08/21 1743  NA 123* 123*  K 4.6 4.8  CL 94*  --   CO2 18*  --   GLUCOSE 768*  --   BUN 47*  --   CREATININE 3.76*  --   CALCIUM 8.3*  --    GFR: Estimated Creatinine Clearance: 28.6 mL/min (A) (by C-G formula based on SCr of 3.76 mg/dL (H)). Liver Function Tests: No results for input(s): "AST", "ALT", "ALKPHOS", "BILITOT", "PROT", "ALBUMIN" in the last 168 hours. No results for input(s): "LIPASE", "AMYLASE" in the last 168 hours. No results for input(s): "AMMONIA" in the last 168 hours. Coagulation Profile: No results for input(s): "INR", "PROTIME" in the last 168 hours. Cardiac Enzymes: No results for input(s): "CKTOTAL", "CKMB", "CKMBINDEX", "TROPONINI" in the last 168 hours. BNP (last  3 results) No results for input(s): "PROBNP" in the last 8760 hours. HbA1C: No results for input(s): "HGBA1C" in the last 72 hours. CBG: Recent Labs  Lab 10/08/21 2054 10/08/21 2151 10/08/21 2252 10/09/21 0014 10/09/21 0122  GLUCAP >600* 436* 358* 182* 148*   Lipid Profile: No results for input(s): "CHOL", "HDL", "LDLCALC", "TRIG", "CHOLHDL", "LDLDIRECT" in the last 72 hours. Thyroid Function Tests: No results for input(s): "TSH", "T4TOTAL", "FREET4", "T3FREE", "THYROIDAB" in the last 72 hours. Anemia Panel: No results for input(s): "VITAMINB12", "FOLATE", "FERRITIN", "TIBC", "IRON", "RETICCTPCT" in the last 72 hours. Urine analysis:    Component Value Date/Time   COLORURINE YELLOW 10/08/2021 1447   APPEARANCEUR HAZY (A) 10/08/2021 1447   LABSPEC 1.017 10/08/2021 1447   PHURINE 5.0 10/08/2021 1447   GLUCOSEU >=500 (A) 10/08/2021 1447   HGBUR NEGATIVE 10/08/2021 1447   BILIRUBINUR NEGATIVE 10/08/2021 1447   BILIRUBINUR negative 09/07/2018 1034   KETONESUR NEGATIVE 10/08/2021 1447   PROTEINUR 100 (A) 10/08/2021 1447   UROBILINOGEN 1.0 09/07/2018 1034   UROBILINOGEN 1.0 01/01/2017 1032   NITRITE NEGATIVE 10/08/2021 1447   LEUKOCYTESUR SMALL (A) 10/08/2021 1447    Radiological Exams on Admission: No results found.   EKG: Independently reviewed, with result as described above.    Assessment/Plan   Principal Problem:   Hyperglycemia Active Problems:  HTN (hypertension)   Hyperlipidemia   HIV disease (HCC)   Acute renal failure superimposed on stage 4 chronic kidney disease (HCC)   Anemia   BPH (benign prostatic hyperplasia)   GERD (gastroesophageal reflux disease)       #) Hyperglycemia: In the setting of a history of type 2 diabetes mellitus, presenting blood sugar found to be 768, without clinical evidence of DKA given no corresponding anion gap metabolic acidosis, nor any elevation in beta hydroxybutyric acid, and no evidence of metabolic acidemia on  presenting blood gas.  Suspect that the degree of elevation of the patient's presenting hyperglycemia is likely inconsistent with HHNK, but will check serum osmolality to further evaluate.  No evidence of associated coma.  Of note, it appears that the patient has been improving his glycemic control over the last several years, now with a hemoglobin A1c of 6.0% when checked on 08/17/2021.   Suspect that today's hyperglycemic presentation is multifactorial in nature, with contribution from unintentional slow optimal compliance with the short acting insulin component of his outpatient regimen, with the patient acknowledging that she does not fully understand how to do doses short acting insulin and a sliding scale fashion, also noting that he is amenable to education to help optimize this aspect of his outpatient glycemic management.  He notes good compliance with his outpatient basal insulin regimen, consisting of Lantus 20 units SQ twice daily.  Suspect additional contributing factor leading to today's hyperglycemia presentation in the form of increase in ostensible losses/dehydration as a consequence of spending additional time in the increased ambient temperatures over the last 3 days, without compensatory increase in oral water intake.  Clinically, no corresponding evidence to suggest son stroke induced rhabdomyolysis, but will check CPK level to further evaluate.  No clinical evidence of suggest underlying infection at this time, given the patient's history of HIV AIDS, will further evaluate for any contributory underlying infectious process, checking urinalysis as well as chest x-ray.  SIRS criteria not met for sepsis.  Additionally, ACS is felt to be less likely given no recent chest pain, while presenting EKG shows no evidence of acute ischemic changes.  In the ED, insulin drip was initiated, and the following IVF administered: 2.5 L LR bolus. Notable additional presenting labs include presenting  corrected serum sodium of  133.5 and serum potassium level of  4.6. In light of this potassium finding,   will transition IVF's at this time to 1/2NS with 10 mEq/L Kcl @ 125 cc/hr.       Plan: Insulin drip per DM2 hyperglycemic Endotool protocol. Q1H cbg's. Will repeat BMP at 1 AM to ensure no interval development of anion gap, and to trend ensuing potassium level given plan for interval insulin drip.  Repeat CMP in the morning. NPO pending ensuing improvement in current degree of hyperglycemia.   Transition IVF's to 1/2NS with 10 mEq/L Kcl @ 125 cc/hr, as serum potassium level is less than 5.0. Once [facility] cbg's reflect serum glucose less than 250, will add D5 to existing IVF's. Check serum Mg and Phos levels, w/ prn supplementation per protocol.  Once the patient's glucose is under 200 and is tolerating p.o., will plan to initiate sq basal insulin, while continuing insulin drip for an additional two hours to prevent rebound hyperglycemia, before ultimately turning off insulin drip.  Monitor on telemetry. Monitor strict I's & O's.  inpatient consult to diabetic educator has been placed, as above. Check serum osmolality.  Check urinalysis, chest x-ray.  Check  CPK level.         #) Acute Kidney Injury on CKD 4: Relative to documented history of CKD 460 with baseline creatinine range of 2.8-3.4, presenting creatinine noted to be 3.76. Appears to be prerenal in nature stemming from dehydration as a consequence of hyperglyemic-associated auto-diuresis, as above, in addition to increased ostensible losses in the setting of significant amount of time spent outside in the hot ambient temperature over the last 2 to 3 days.  Potential pharmacologic exacerbation in the setting of outpatient lisinopril.  Plan: monitor strict I's & O's and daily weights. Attempt to avoid nephrotoxic agents, include holding of home lisinopril, gabapentin, metformin.  Additionally, to reduce risk for urinary retention induced  postrenal contribution towards AKI, will hold home oxybutynin for now.  IVF's, as above. Check urinalysis with microscopy, and add on random urine sodium as well as random urine creatinine.. Repeat bmp in AM. Check serum mag level.  Add on CPK level.         #) HIV/AIDS: Most recent CD4 count noted to be 129 on 08/17/2021.  On Dovato and Doravirine as an outpatient, noting good compliance with these antiviral medications.  Also on prophylactic dapsone.  Plan: Continue outpatient antiviral therapy as well as dapsone.            #) Essential Hypertension: documented h/o such, with outpatient antihypertensive regimen including Coreg, Norvasc, lisinopril.  SBP's in the ED today: 130s 150s mmHg. in the setting of presenting AKI superimposed on stage IV CKD, will hold home lisinopril for now.  Given this presentation associated with dehydration, will also be conservative and resumption of his other antihypertensive medications, as indicated below.  Plan: Close monitoring of subsequent BP via routine VS. hold home lisinopril and Norvasc for now.  We will resume Coreg, with next dose to occur in the morning.  Further evaluation management of presenting AKI on CKD 4, as above.         #) Hyperlipidemia: documented h/o such. On high intensity atorvastatin as well as Vascepa as outpatient.    Plan: continue home statin and Vascepa.           #) GERD: documented h/o such; on Protonix as outpatient.   Plan: continue home PPI.            #) Anemia of chronic disease: Documented history of such, associated baseline hemoglobin range of 10-12, with presenting hemoglobin consistent with this range, in the absence of any evidence of overt bleed.  Plan: Repeat CBC in the morning.            #) Benign Prostatic Hyperplasia:  documented h/o such; on tamsulosin as outpatient.   Plan: monitor strict I's & O's and daily weights. Repeat BMP in AM.  continue home  finasteride.         DVT prophylaxis: Heparin 5000 units SQ 3 times daily Code Status: Full code Family Communication: none Disposition Plan: Per Rounding Team Consults called: none;  Admission status: Inpatient   PLEASE NOTE THAT DRAGON DICTATION SOFTWARE WAS USED IN THE CONSTRUCTION OF THIS NOTE.   [place] DO [facility]  [place]   10/09/2021, 1:30 AM

## 2021-10-09 NOTE — Assessment & Plan Note (Signed)
 #)   Essential Hypertension: documented h/o such, with outpatient antihypertensive regimen including Coreg, Norvasc, lisinopril.  SBP's in the ED today: 130s 150s mmHg. in the setting of presenting AKI superimposed on stage IV CKD, will hold home lisinopril for now.  Given this presentation associated with dehydration, will also be conservative and resumption of his other antihypertensive medications, as indicated below.  Plan: Close monitoring of subsequent BP via routine VS. hold home lisinopril and Norvasc for now.  We will resume Coreg, with next dose to occur in the morning.  Further evaluation management of presenting AKI on CKD 4, as above.

## 2021-10-09 NOTE — Progress Notes (Signed)
Patient transferred from ED at 1334hrs.  Oriented to unit and plan of care for shift.  Bilateral prosthesis removed.  Patient given complete bath due to urine noted in prosthesis. Wounds to right AKA documented.

## 2021-10-09 NOTE — Assessment & Plan Note (Signed)
  #)   Hyperglycemia: In the setting of a history of type 2 diabetes mellitus, presenting blood sugar found to be 768, without clinical evidence of DKA given no corresponding anion gap metabolic acidosis, nor any elevation in beta hydroxybutyric acid, and no evidence of metabolic acidemia on presenting blood gas.  Suspect that the degree of elevation of the patient's presenting hyperglycemia is likely inconsistent with HHNK, but will check serum osmolality to further evaluate.  No evidence of associated coma.  Of note, it appears that the patient has been improving his glycemic control over the last several years, now with a hemoglobin A1c of 6.0% when checked on 08/17/2021.   Suspect that today's hyperglycemic presentation is multifactorial in nature, with contribution from unintentional slow optimal compliance with the short acting insulin component of his outpatient regimen, with the patient acknowledging that she does not fully understand how to do doses short acting insulin and a sliding scale fashion, also noting that he is amenable to education to help optimize this aspect of his outpatient glycemic management.  He notes good compliance with his outpatient basal insulin regimen, consisting of Lantus 20 units SQ twice daily.  Suspect additional contributing factor leading to today's hyperglycemia presentation in the form of increase in ostensible losses/dehydration as a consequence of spending additional time in the increased ambient temperatures over the last 3 days, without compensatory increase in oral water intake.  Clinically, no corresponding evidence to suggest son stroke induced rhabdomyolysis, but will check CPK level to further evaluate.  No clinical evidence of suggest underlying infection at this time, given the patient's history of HIV AIDS, will further evaluate for any contributory underlying infectious process, checking urinalysis as well as chest x-ray.  SIRS criteria not met for sepsis.   Additionally, ACS is felt to be less likely given no recent chest pain, while presenting EKG shows no evidence of acute ischemic changes.  In the ED, insulin drip was initiated, and the following IVF administered: 2.5 L LR bolus. Notable additional presenting labs include presenting corrected serum sodium of  133.5 and serum potassium level of  4.6. In light of this potassium finding,   will transition IVF's at this time to 1/2NS with 10 mEq/L Kcl @ 125 cc/hr.       Plan: Insulin drip per DM2 hyperglycemic Endotool protocol. Q1H cbg's. Will repeat BMP at 1 AM to ensure no interval development of anion gap, and to trend ensuing potassium level given plan for interval insulin drip.  Repeat CMP in the morning. NPO pending ensuing improvement in current degree of hyperglycemia.   Transition IVF's to 1/2NS with 10 mEq/L Kcl @ 125 cc/hr, as serum potassium level is less than 5.0. Once [facility] cbg's reflect serum glucose less than 250, will add D5 to existing IVF's. Check serum Mg and Phos levels, w/ prn supplementation per protocol.  Once the patient's glucose is under 200 and is tolerating p.o., will plan to initiate sq basal insulin, while continuing insulin drip for an additional two hours to prevent rebound hyperglycemia, before ultimately turning off insulin drip.  Monitor on telemetry. Monitor strict I's & O's.  inpatient consult to diabetic educator has been placed, as above. Check serum osmolality.  Check urinalysis, chest x-ray.  Check CPK level.

## 2021-10-09 NOTE — Assessment & Plan Note (Signed)
 #)   Hyperlipidemia: documented h/o such. On high intensity atorvastatin as well as Vascepa as outpatient.    Plan: continue home statin and Vascepa.

## 2021-10-09 NOTE — Assessment & Plan Note (Signed)
  #)   Anemia of chronic disease: Documented history of such, associated baseline hemoglobin range of 10-12, with presenting hemoglobin consistent with this range, in the absence of any evidence of overt bleed.  Plan: Repeat CBC in the morning.

## 2021-10-09 NOTE — Progress Notes (Addendum)
PROGRESS NOTE    STEIN WINDHORST  BMW:[id number] DOB: 1965/07/18 DOA: 10/08/2021 [name], NP    Brief Narrative:  Keith [name] is a 56 y.o. male with past medical history of HIV/ AIDS on antiretroviral therapy, hypertension, hyperlipidemia, stage IV CKD with baseline creatinine of 2.8-3.4, type 2 diabetes mellitus, anemia of chronic disease, BPH presented to hospital with elevated blood glucose level.  Patient stated that he was spending his time outside for last 2 to 3 days and glucometer was reading high.  He reported good compliance with basal insulin but not with mealtime insulin.  Patient was hospitalized in May 2023 for hyperglycemia.  In the ED, vitals were stable.  Blood glucose level was more than 600.  Sodium was 123 and corrected to 133.  Anion gap 11.  CBC showed normal WBC count. Patient received LR bolus 2.5 L and insulin drip and was admitted to hospital for hyperglycemia and AKI.  Assessment and plan.  Principal Problem:   Hyperglycemia Active Problems:   HTN (hypertension)   Hyperlipidemia   HIV disease (HCC)   Acute renal failure superimposed on stage 4 chronic kidney disease (HCC)   Anemia   BPH (benign prostatic hyperplasia)   GERD (gastroesophageal reflux disease)   Hyperglycemia, uncontrolled type 2 diabetes.  Likely secondary to suboptimal compliance with insulin at home.  Patient is stated that he did not know how to use it.  Will need diabetic coordinator follow-up.  Latest hemoglobin A1c of 6.0% on 08/17/2021.  Received IV fluids and insulin drip during hospitalization.. Diabetic coordinator has been consulted.  Latest POC glucose of 158.  We will discontinue insulin drip and switch to sliding scale and long-acting insulin at this time.  Monitor BMP twice a day.  Check levels in AM.   Acute Kidney Injury on CKD 4:  Likely secondary to hyperglycemia volume depletion.  Creatinine of 3.7 yesterday and repeated BMP from today at 3.3.  Hold lisinopril,  gabapentin metformin.  Continue to monitor closely.  Intake and output charting.  Continue IV fluids today.  Check BMP in AM.  HIV/AIDS: Most recent CD4 count noted to be 129 on 08/17/2021.  On Dovato and Doravirine as an outpatient, noting good compliance with these antiviral medications.  Also on prophylactic dapsone.  Continue antiretroviral treatment     Essential Hypertension:  On Coreg, Norvasc and lisinopril as outpatient.  Hold lisinopril due to AKI.  Continue amlodipine and Coreg.  Hyperlipidemia: Continue statin and Vascepa.    GERD: Continue Protonix   Anemia of chronic disease:  Hemoglobin of 10-12.  We will continue to monitor.    Benign Prostatic Hyperplasia: Continue finasteride.  Leg pain.  Patient takes Percocet at home.  Will resume while in the hospital.      DVT prophylaxis: heparin injection 5,000 Units Start: 10/09/21 1400   Code Status:     Code Status: Full Code  Disposition: Home likely in 1 to 2 days.  Status is: Inpatient  Remains inpatient appropriate because: Hyperglycemia, acute kidney injury, HIV.   Family Communication: Communicated with the patient at bedside  Consultants:  None  Procedures:  None  Antimicrobials:  None  Anti-infectives (From admission, onward)    Start     Dose/Rate Route Frequency Ordered Stop   10/09/21 1000  dapsone tablet 100 mg        100 mg Oral Daily 10/09/21 0129     10/09/21 1000  dolutegravir-lamiVUDine (DOVATO) 50-300 MG per tablet 1 tablet  1 tablet Oral Daily 10/09/21 0129     10/09/21 1000  doravirine (PIFELTRO) tablet 100 mg        100 mg Oral Daily 10/09/21 0129        Subjective: Today, patient was seen and examined at bedside.  Complains of pain in the legs.  Denies any nausea vomiting fever chills or rigor.  Objective: Vitals:   10/09/21 1230 10/09/21 1334 10/09/21 1403 10/09/21 1413  BP: (!) 158/96 (!) 158/110  (!) 159/92  Pulse: 64 71  75  Resp: 17 15  18   Temp:  97.8 F (36.6  C)    TempSrc:  Oral    SpO2: 99% 100%  100%  Weight:   122.2 kg   Height:   5\' 10"  (1.778 m)     Intake/Output Summary (Last 24 hours) at 10/09/2021 1432 Last data filed at 10/09/2021 1418 Gross per 24 hour  Intake 2191.91 ml  Output 750 ml  Net 1441.91 ml   Filed Weights   10/08/21 1800 10/09/21 1403  Weight: 124.8 kg 122.2 kg    Physical Examination: Body mass index is 38.66 kg/m.  General: Obese built, not in obvious distress, on nasal cannula oxygen HENT:   No scleral pallor or icterus noted. Oral mucosa is moist.  Chest:  Clear breath sounds.  Diminished breath sounds bilaterally. No crackles or wheezes.  CVS: S1 &S2 heard. No murmur.  Regular rate and rhythm. Abdomen: Soft, nontender, nondistended.  Bowel sounds are heard.   Extremities: Bilateral below-knee amputation with prosthesis Psych: Alert, awake and oriented, normal mood CNS:  No cranial nerve deficits.  Moves extremities. Skin: Warm and dry.  No rashes noted.  Data Reviewed:   CBC: Recent Labs  Lab 10/08/21 1620 10/08/21 1743 10/09/21 0838  WBC 8.1  --  8.5  NEUTROABS  --   --  6.0  HGB 12.4* 13.6 12.2*  HCT 37.4* 40.0 36.4*  MCV 90.8  --  88.8  PLT 247  --  676    Basic Metabolic Panel: Recent Labs  Lab 10/08/21 1620 10/08/21 1743 10/09/21 0838  NA 123* 123* 136  K 4.6 4.8 4.0  CL 94*  --  101  CO2 18*  --  25  GLUCOSE 768*  --  176*  BUN 47*  --  41*  CREATININE 3.76*  --  3.34*  CALCIUM 8.3*  --  8.9  MG  --   --  1.8    Liver Function Tests: No results for input(s): "AST", "ALT", "ALKPHOS", "BILITOT", "PROT", "ALBUMIN" in the last 168 hours.   Radiology Studies: DG CHEST PORT 1 VIEW  Result Date: 10/09/2021 CLINICAL DATA:  Hyperglycemia. EXAM: PORTABLE CHEST 1 VIEW COMPARISON:  09/29/2020 FINDINGS: The heart size and mediastinal contours are within normal limits. Both lungs are clear. The visualized skeletal structures are unremarkable. IMPRESSION: No active disease.  Electronically Signed   By: Kerby Moors M.D.   On: 10/09/2021 06:05      LOS: 1 day    Flora Lipps, MD [facility] Available via Epic secure chat 7am-7pm After these hours, please refer to coverage provider listed on [email or web address] 10/09/2021, 2:32 PM

## 2021-10-09 NOTE — Assessment & Plan Note (Signed)
   #)   Acute Kidney Injury on CKD 4: Relative to documented history of CKD 460 with baseline creatinine range of 2.8-3.4, presenting creatinine noted to be 3.76. Appears to be prerenal in nature stemming from dehydration as a consequence of hyperglyemic-associated auto-diuresis, as above, in addition to increased ostensible losses in the setting of significant amount of time spent outside in the hot ambient temperature over the last 2 to 3 days.  Potential pharmacologic exacerbation in the setting of outpatient lisinopril.  Plan: monitor strict I's & O's and daily weights. Attempt to avoid nephrotoxic agents, include holding of home lisinopril, gabapentin, metformin.  Additionally, to reduce risk for urinary retention induced postrenal contribution towards AKI, will hold home oxybutynin for now.  IVF's, as above. Check urinalysis with microscopy, and add on random urine sodium as well as random urine creatinine.. Repeat bmp in AM. Check serum mag level.  Add on CPK level.

## 2021-10-09 NOTE — Hospital Course (Addendum)
Keith [name] is a 56 y.o. male with past medical history of HIV/ AIDS on antiretroviral therapy, hypertension, hyperlipidemia, stage IV CKD with baseline creatinine of 2.8-3.4, type 2 diabetes mellitus, anemia of chronic disease, BPH presented to hospital with elevated blood glucose level.  Patient stated that he was spending his time outside for last 2 to 3 days and glucometer was reading high.  He reported good compliance with basal insulin but not with mealtime insulin.  Patient was hospitalized in May 2023 for hyperglycemia.  In the ED, vitals were stable.  Blood glucose level was more than 600.  Sodium was 123 and corrected to 133.  Anion gap 11.  CBC showed normal WBC count. Patient received LR bolus 2.5 L and insulin drip and was admitted to hospital for hyperglycemia and AKI.  Assessment and plan.  Principal Problem:   Hyperglycemia Active Problems:   HTN (hypertension)   Hyperlipidemia   HIV disease (HCC)   Acute renal failure superimposed on stage 4 chronic kidney disease (HCC)   Anemia   BPH (benign prostatic hyperplasia)   GERD (gastroesophageal reflux disease)   Hyperglycemia, uncontrolled type 2 diabetes.  Likely secondary to suboptimal compliance with insulin at home.  Patient is stated that he did not know how to use it.  Will need diabetic coordinator follow-up.  Latest hemoglobin A1c of 6.0% on 08/17/2021.  Received IV fluids and insulin drip during hospitalization.. Diabetic coordinator has been consulted.  Latest POC glucose of 158.  We will discontinue insulin drip and switch to sliding scale and long-acting insulin at this time.  Monitor BMP twice a day.  Check levels in AM.   Acute Kidney Injury on CKD 4:  Likely secondary to hyperglycemia volume depletion.  Creatinine of 3.7 yesterday and repeated BMP from today at 3.3.  Hold lisinopril, gabapentin metformin.  Continue to monitor closely.  Intake and output charting.  Continue IV fluids today.  Check BMP in AM.  HIV/AIDS:  Most recent CD4 count noted to be 129 on 08/17/2021.  On Dovato and Doravirine as an outpatient, noting good compliance with these antiviral medications.  Also on prophylactic dapsone.  Continue antiretroviral treatment     Essential Hypertension:  On Coreg, Norvasc and lisinopril as outpatient.  Hold lisinopril due to AKI.  Continue amlodipine and Coreg.  Hyperlipidemia: Continue statin and Vascepa.    GERD: Continue Protonix   Anemia of chronic disease:  Hemoglobin of 10-12.  We will continue to monitor.    Benign Prostatic Hyperplasia: Continue finasteride.  Leg pain.  Patient takes Percocet at home.  Will resume while in the hospital.

## 2021-10-09 NOTE — Assessment & Plan Note (Signed)
 #)   GERD: documented h/o such; on Protonix as outpatient.   Plan: continue home PPI.    

## 2021-10-09 NOTE — Assessment & Plan Note (Signed)
   #)   HIV/AIDS: Most recent CD4 count noted to be 129 on 08/17/2021.  On Dovato and Doravirine as an outpatient, noting good compliance with these antiviral medications.  Also on prophylactic dapsone.  Plan: Continue outpatient antiviral therapy as well as dapsone.

## 2021-10-09 NOTE — Assessment & Plan Note (Signed)
  #)   Benign Prostatic Hyperplasia:  documented h/o such; on tamsulosin as outpatient.   Plan: monitor strict I's & O's and daily weights. Repeat BMP in AM.  continue home finasteride.

## 2021-10-10 ENCOUNTER — Encounter: Payer: Medicaid Other | Admitting: Physical Therapy

## 2021-10-10 DIAGNOSIS — R739 Hyperglycemia, unspecified: Secondary | ICD-10-CM | POA: Diagnosis not present

## 2021-10-10 LAB — BASIC METABOLIC PANEL
Anion gap: 7 (ref 5–15)
BUN: 46 mg/dL — ABNORMAL HIGH (ref 6–20)
CO2: 19 mmol/L — ABNORMAL LOW (ref 22–32)
Calcium: 8.1 mg/dL — ABNORMAL LOW (ref 8.9–10.3)
Chloride: 107 mmol/L (ref 98–111)
Creatinine, Ser: 3.62 mg/dL — ABNORMAL HIGH (ref 0.61–1.24)
GFR, Estimated: 19 mL/min — ABNORMAL LOW (ref >60)
Glucose, Bld: 382 mg/dL — ABNORMAL HIGH (ref 70–99)
Potassium: 4.3 mmol/L (ref 3.5–5.1)
Sodium: 133 mmol/L — ABNORMAL LOW (ref 135–145)

## 2021-10-10 LAB — CBC
HCT: 34.1 % — ABNORMAL LOW (ref 39.0–52.0)
Hemoglobin: 10.9 g/dL — ABNORMAL LOW (ref 13.0–17.0)
MCH: 28.7 pg (ref 26.0–34.0)
MCHC: 32 g/dL (ref 30.0–36.0)
MCV: 89.7 fL (ref 80.0–100.0)
Platelets: 240 10*3/uL (ref 150–400)
RBC: 3.8 MIL/uL — ABNORMAL LOW (ref 4.22–5.81)
RDW: 12.6 % (ref 11.5–15.5)
WBC: 6.7 10*3/uL (ref 4.0–10.5)
nRBC: 0 % (ref 0.0–0.2)

## 2021-10-10 LAB — GLUCOSE, CAPILLARY
Glucose-Capillary: 245 mg/dL — ABNORMAL HIGH (ref 70–99)
Glucose-Capillary: 297 mg/dL — ABNORMAL HIGH (ref 70–99)
Glucose-Capillary: 306 mg/dL — ABNORMAL HIGH (ref 70–99)
Glucose-Capillary: 327 mg/dL — ABNORMAL HIGH (ref 70–99)
Glucose-Capillary: 335 mg/dL — ABNORMAL HIGH (ref 70–99)
Glucose-Capillary: 363 mg/dL — ABNORMAL HIGH (ref 70–99)
Glucose-Capillary: >600 mg/dL (ref 70–99)

## 2021-10-10 LAB — MAGNESIUM: Magnesium: 1.9 mg/dL (ref 1.7–2.4)

## 2021-10-10 MED ORDER — MUPIROCIN CALCIUM 2 % EX CREA
TOPICAL_CREAM | Freq: Every day | CUTANEOUS | Status: DC
  Administered 2021-10-17: 1 via TOPICAL
  Filled 2021-10-10: qty 15

## 2021-10-10 MED ORDER — LIVING WELL WITH DIABETES BOOK
Freq: Once | Status: AC
Start: 2021-10-10 — End: 2021-10-10
  Filled 2021-10-10: qty 1

## 2021-10-10 NOTE — TOC Initial Note (Deleted)
Transition of Care [facility]) - Initial/Assessment Note    Patient Details  Name: Keith [name] MRN: [id number] Date of Birth: 06/27/1965  [facility] CM/SW Contact:    Bethena Roys, RN Phone Number: 10/10/2021, 1:47 PM  Clinical Narrative:                   Expected Discharge Plan: Homeless Shelter Barriers to Discharge: Continued Medical Work up (Homeless)   Patient Goals and CMS Choice        Expected Discharge Plan and Services Expected Discharge Plan: Homeless Shelter In-house Referral: Clinical Social Work Discharge Planning Services: CM Consult Post Acute Care Choice: NA Living arrangements for the past 2 months: No permanent address (trying to seek out boarding homes.)                                      Prior Living Arrangements/Services Living arrangements for the past 2 months: No permanent address (trying to seek out boarding homes.) Lives with:: Self Patient language and need for interpreter reviewed:: Yes        Need for Family Participation in Patient Care: No (Comment) Care giver support system in place?: No (comment) Current home services: DME (rolling walker) Criminal Activity/Legal Involvement Pertinent to Current Situation/Hospitalization: No - Comment as needed  Activities of Daily Living Home Assistive Devices/Equipment: Walker (specify type) ADL Screening (condition at time of admission) Patient's cognitive ability adequate to safely complete daily activities?: Yes Is the patient deaf or have difficulty hearing?: No Does the patient have difficulty seeing, even when wearing glasses/contacts?: No Does the patient have difficulty concentrating, remembering, or making decisions?: No Patient able to express need for assistance with ADLs?: Yes Does the patient have difficulty dressing or bathing?: Yes Independently performs ADLs?: No Communication: Independent Dressing (OT): Needs assistance Is this a change from  baseline?: Pre-admission baseline Grooming: Independent Feeding: Independent Bathing: Needs assistance Is this a change from baseline?: Pre-admission baseline Toileting: Needs assistance Is this a change from baseline?: Pre-admission baseline Walks in Home: Needs assistance Is this a change from baseline?: Pre-admission baseline Does the patient have difficulty walking or climbing stairs?: Yes Weakness of Legs: None Weakness of Arms/Hands: None  Permission Sought/Granted         Permission granted to share info w AGENCY: [place] Case Manager Bernadine Minor [phone]        Emotional Assessment   Attitude/Demeanor/Rapport: Engaged Affect (typically observed): Appropriate Orientation: : Oriented to Situation, Oriented to  Time, Oriented to Self, Oriented to Place Alcohol / Substance Use: Not Applicable Psych Involvement: No (comment)  Admission diagnosis:  Hyperglycemia [R73.9] Acute kidney injury ([place]) [N17.9] Patient Active Problem List   Diagnosis Date Noted   BPH (benign prostatic hyperplasia)    GERD (gastroesophageal reflux disease)    Headache 08/22/2021   Anxiety 02/13/2021   Healthcare maintenance 02/13/2021   History of left above knee amputation ([place]) 02/07/2021   Wound dehiscence 01/03/2021   Coagulopathy ([place]) 12/22/2020   CAD (coronary artery disease) 12/22/2020   Obesity 12/22/2020   Gross hematuria 12/20/2020   ABLA (acute blood loss anemia) 12/20/2020   Dehiscence of amputation stump (HCC)    Hx of BKA, left (HCC)    Cellulitis of left foot    Acute osteomyelitis of left calcaneus (HCC)    Ulcer of heel due to diabetes mellitus ([place])    Anemia  Cellulitis 10/18/2020   Atypical chest pain 28/76/8115   Acute metabolic encephalopathy 72/62/0355   Hyperglycemia due to diabetes mellitus ([place]) 08/29/2020   Hyperglycemia 08/29/2020   Acute cerebrovascular accident (CVA) due to ischemia [facility]) 04/28/2020   Type 2 diabetes mellitus with  hyperlipidemia ([place])    PAD (peripheral artery disease) ([place])    Hypoglycemia 04/25/2020   Acute ST elevation myocardial infarction (STEMI) due to occlusion of circumflex coronary artery ([place]) 03/22/2020   Uncontrolled type 2 diabetes mellitus with hyperglycemia ([place]) 01/14/2019   Elevated glucose 01/14/2019   Hx of BKA, right ([place]) 01/14/2019   Pressure injury of skin 11/11/2018   STEMI (ST elevation myocardial infarction) ([place]) 11/10/2018   CKD (chronic kidney disease) stage 4, GFR 15-29 ml/min ([place]) 11/10/2018   Amputation stump infection ([place]) 10/28/2018   Type II diabetes mellitus with renal manifestations ([place]) 08/07/2018   Uncontrolled type 2 diabetes mellitus with hyperosmolar nonketotic hyperglycemia ([place]) 08/07/2018   Non-pressure chronic ulcer of right calf limited to breakdown of skin ([place]) 07/06/2018   Type 2 diabetes mellitus without complication, without long-term current use of insulin ([place]) 06/15/2018   Chronic low back pain without sciatica 06/15/2018   Idiopathic chronic venous hypertension of left lower extremity with ulcer and inflammation (HCC)    Venous stasis ulcer of left calf limited to breakdown of skin without varicose veins ([place]) 04/23/2018   Acquired absence of right leg below knee ([place]) 06/13/2017   Acute renal failure superimposed on stage 4 chronic kidney disease ([place]) 03/15/2017   Diabetic polyneuropathy associated with type 2 diabetes mellitus ([place]) 01/23/2017   AKI (acute kidney injury) ([place]) 09/28/2016   Nausea vomiting and diarrhea 09/28/2016   Onychomycosis of multiple toenails with type 2 diabetes mellitus ([place]) 08/29/2015   MRSA carrier 04/20/2014   Penile wart 02/22/2014   HIV disease ([place])    Insulin-requiring or dependent type II diabetes mellitus ([place]) 02/04/2014   Dental anomaly 11/20/2012   Arthritis of right knee 02/23/2012   Hyperlipidemia 11/10/2011   Hyponatremia 11/10/2011   Chronic pain 08/07/2011   Meralgia paraesthetica  04/23/2011   ERECTILE DYSFUNCTION 08/22/2008   HTN (hypertension) 05/19/2008   PCP:  [name], NP Pharmacy:   [id number] [place], [place] [address] Phone: [phone] Fax: [phone]  [id number] [place], [place] [address] Phone: [phone] Fax: [phone]  [facility] [address] [place] [address] Phone: [phone] Fax: [phone]  [id number] [place], [place] [address] [place] [address] Phone: [phone] Fax: [place], [place] - [address] [place] [address] Phone: [phone] Fax: [phone]     Social Determinants of Health (SDOH) Interventions    Readmission Risk Interventions    10/10/2021    1:31 PM 10/21/2020   11:53 AM 08/31/2020    2:30 PM  Readmission Risk Prevention Plan  Transportation Screening Complete Complete   PCP or Specialist Appt within 3-5 Days     HRI or [place] for [place] Planning/Counseling     Palliative Care Screening     Medication Review (RN Care Manager) Complete Complete   PCP or Specialist appointment within 3-5 days of discharge Complete Complete   [place]  or Home Care Consult Complete Complete   SW Recovery Care/Counseling Consult Complete Complete   Palliative Care Screening Not Applicable Not Applicable   Skilled Nursing Facility Not Applicable Complete      Information is confidential and restricted. Go to Review Flowsheets to unlock data.

## 2021-10-10 NOTE — TOC Initial Note (Signed)
Transition of Care [facility]) - Initial/Assessment Note    Patient Details  Name: Keith [name] MRN: [id number] Date of Birth: 1965-10-05  Transition of Care [facility] CM/SW Contact:    Bethena Roys, RN Phone Number: 10/10/2021, 1:49 PM  Clinical Narrative:  Risk for readmission assessment completed. Case Manager spoke with the patient and he is currently homeless. Patient states he is living in his car and has been working with a Tourist information centre manager from [place] for housing assistance for a room in a boarding house. Case Manager reached out to [place] and patient is not a member of [place]. Liaison provided a number for the [facility] at [phone]. Case Manager did call and received a voicemail-awaiting phone call back. Case Manager discussed home health services with the patient and he was previously active with [facility]-no longer able to accept the patient for services if he does not have a permanent address. Patient reports that he gets his medications without any issues from [facility]. Case Manager called the CSW to see if she can assist with shelter resources vs ALF assistance. Case Manager will continue to follow for additional transition of care needs.    Expected Discharge Plan: Homeless Shelter Barriers to Discharge: Continued Medical Work up (Homeless)   Patient Goals and CMS Choice        Expected Discharge Plan and Services Expected Discharge Plan: Homeless Shelter In-house Referral: Clinical Social Work Discharge Planning Services: CM Consult Post Acute Care Choice: NA Living arrangements for the past 2 months: No permanent address (trying to seek out boarding homes.)                   Prior Living Arrangements/Services Living arrangements for the past 2 months: No permanent address (trying to seek out boarding homes.) Lives with:: Self Patient language and need for interpreter reviewed:: Yes        Need  for Family Participation in Patient Care: No (Comment) Care giver support system in place?: No (comment) Current home services: DME (rolling walker) Criminal Activity/Legal Involvement Pertinent to Current Situation/Hospitalization: No - Comment as needed  Activities of Daily Living Home Assistive Devices/Equipment: Walker (specify type) ADL Screening (condition at time of admission) Patient's cognitive ability adequate to safely complete daily activities?: Yes Is the patient deaf or have difficulty hearing?: No Does the patient have difficulty seeing, even when wearing glasses/contacts?: No Does the patient have difficulty concentrating, remembering, or making decisions?: No Patient able to express need for assistance with ADLs?: Yes Does the patient have difficulty dressing or bathing?: Yes Independently performs ADLs?: No Communication: Independent Dressing (OT): Needs assistance Is this a change from baseline?: Pre-admission baseline Grooming: Independent Feeding: Independent Bathing: Needs assistance Is this a change from baseline?: Pre-admission baseline Toileting: Needs assistance Is this a change from baseline?: Pre-admission baseline Walks in Home: Needs assistance Is this a change from baseline?: Pre-admission baseline Does the patient have difficulty walking or climbing stairs?: Yes Weakness of Legs: None Weakness of Arms/Hands: None  Permission Sought/Granted         Permission granted to share info w AGENCY: [place] Case Manager Bernadine Minor [phone]        Emotional Assessment   Attitude/Demeanor/Rapport: Engaged Affect (typically observed): Appropriate Orientation: : Oriented to Situation, Oriented to  Time, Oriented to Self, Oriented to Place Alcohol / Substance Use: Not Applicable Psych Involvement: No (comment)  Admission diagnosis:  Hyperglycemia [R73.9] Acute kidney injury ([place]) [N17.9]  Patient Active Problem List   Diagnosis Date  Noted   BPH (benign prostatic hyperplasia)    GERD (gastroesophageal reflux disease)    Headache 08/22/2021   Anxiety 02/13/2021   Healthcare maintenance 02/13/2021   History of left above knee amputation ([place]) 02/07/2021   Wound dehiscence 01/03/2021   Coagulopathy ([place]) 12/22/2020   CAD (coronary artery disease) 12/22/2020   Obesity 12/22/2020   Gross hematuria 12/20/2020   ABLA (acute blood loss anemia) 12/20/2020   Dehiscence of amputation stump (HCC)    Hx of BKA, left ([place])    Cellulitis of left foot    Acute osteomyelitis of left calcaneus (HCC)    Ulcer of heel due to diabetes mellitus ([place])    Anemia    Cellulitis 10/18/2020   Atypical chest pain 95/12/3265   Acute metabolic encephalopathy 12/45/8099   Hyperglycemia due to diabetes mellitus ([place]) 08/29/2020   Hyperglycemia 08/29/2020   Acute cerebrovascular accident (CVA) due to ischemia ([place]) 04/28/2020   Type 2 diabetes mellitus with hyperlipidemia ([place])    PAD (peripheral artery disease) ([place])    Hypoglycemia 04/25/2020   Acute ST elevation myocardial infarction (STEMI) due to occlusion of circumflex coronary artery ([place]) 03/22/2020   Uncontrolled type 2 diabetes mellitus with hyperglycemia ([place]) 01/14/2019   Elevated glucose 01/14/2019   Hx of BKA, right ([place]) 01/14/2019   Pressure injury of skin 11/11/2018   STEMI (ST elevation myocardial infarction) ([place]) 11/10/2018   CKD (chronic kidney disease) stage 4, GFR 15-29 ml/min ([place]) 11/10/2018   Amputation stump infection ([place]) 10/28/2018   Type II diabetes mellitus with renal manifestations ([place]) 08/07/2018   Uncontrolled type 2 diabetes mellitus with hyperosmolar nonketotic hyperglycemia ([place]) 08/07/2018   Non-pressure chronic ulcer of right calf limited to breakdown of skin ([place]) 07/06/2018   Type 2 diabetes mellitus without complication, without long-term current use of insulin ([place]) 06/15/2018   Chronic low back pain without sciatica 06/15/2018   Idiopathic  chronic venous hypertension of left lower extremity with ulcer and inflammation (HCC)    Venous stasis ulcer of left calf limited to breakdown of skin without varicose veins ([place]) 04/23/2018   Acquired absence of right leg below knee ([place]) 06/13/2017   Acute renal failure superimposed on stage 4 chronic kidney disease ([place]) 03/15/2017   Diabetic polyneuropathy associated with type 2 diabetes mellitus ([place]) 01/23/2017   AKI (acute kidney injury) ([place]) 09/28/2016   Nausea vomiting and diarrhea 09/28/2016   Onychomycosis of multiple toenails with type 2 diabetes mellitus ([place]) 08/29/2015   MRSA carrier 04/20/2014   Penile wart 02/22/2014   HIV disease ([place])    Insulin-requiring or dependent type II diabetes mellitus ([place]) 02/04/2014   Dental anomaly 11/20/2012   Arthritis of right knee 02/23/2012   Hyperlipidemia 11/10/2011   Hyponatremia 11/10/2011   Chronic pain 08/07/2011   Meralgia paraesthetica 04/23/2011   ERECTILE DYSFUNCTION 08/22/2008   HTN (hypertension) 05/19/2008   PCP:  [name], NP Pharmacy:   [id number] [place], [place] [address] Phone: [phone] Fax: [phone]  [id number] [place], [address] [place] [address] Phone: [phone] Fax: [phone]  [facility] [address] [place] [address] Phone: [phone] Fax: [phone]  [id number] [place] [place] [address] [place] [address] Phone: [phone] Fax: [phone] [place], [place] [address] [place] [address] Phone: [phone] Fax: [phone]   Readmission Risk Interventions    10/10/2021    1:31 PM 10/21/2020   11:53 AM 08/31/2020    2:30 PM  Readmission Risk Prevention Plan   Transportation Screening Complete Complete   PCP or Specialist Appt within 3-5 Days     HRI or [place] Work Consult for [place] Planning/Counseling     Palliative Care Screening     Medication Review Press photographer) Complete Complete   PCP or Specialist appointment within 3-5 days of discharge Complete Complete   HRI or [place] Complete Complete   SW Recovery Care/Counseling Consult Complete Complete   Palliative Care Screening Not Applicable Not Applicable   [place] Not Applicable Complete      Information is confidential and restricted. Go to Review Flowsheets to unlock data.

## 2021-10-10 NOTE — Consult Note (Addendum)
[place] Nurse Consult Note: Reason for Consult: WOC consult requested for left stump.  Pt had an AKA performed 9/22 by Dr Sharol Given and has been followed by him as an outpatient.  Left stump with 2 areas of full thickness wounds; 2X3X.1cm and 3X.5X.1cm, red and moist, no odor, drainage, or fluctuance. Pt states they developed recently and his prosthesis has been rubbing over the affected area.  Discussed with patient the importance of contacting the orthotics vendor he utilizes to be refitted and avoid further rubbing of the location to promote healing after discharge, and he verbalized understanding. Dressing procedure/placement/frequency: Topical treatment orders provided for bedside nurses to perform to provide antimicrobial benefits and promote moist healing as follows:  Apply Bactroban to left stump Q day, then cover with foam dressing.  (Change foam dressing Q 3 days or PRN soiling.) Please re-consult if further assistance is needed.  Thank-you,  Julien Girt MSN, [place], [place], [place], [place]

## 2021-10-10 NOTE — Progress Notes (Addendum)
CSW received consult for patient. CSW spoke with patient. Patient reports he is homeless and has been living out of his car for the past 3 years. CSW offered patient area [facility] resources. Patient accepted. Patient gave CSW permission to reach out to some area shelters for him. CSW called  CSW called [facility] no current shelter beds available CSW called [facility] [place] and LVM. CSW awaiting callback.CSW called and [facility] for [facility].CM informed CSW CM awaiting callback from [place] case manager regarding housing assistance.Patient gave CSW permission to fax referral for possible ALF placement to [facility] and [facility]. CSW awaiting determination.CSW will continue to follow and assist with patients dc planning needs.

## 2021-10-10 NOTE — Inpatient Diabetes Management (Signed)
Inpatient Diabetes Program Recommendations  AACE/ADA: New Consensus Statement on Inpatient Glycemic Control (2015)  Target Ranges:  Prepandial:   less than 140 mg/dL      Peak postprandial:   less than 180 mg/dL (1-2 hours)      Critically ill patients:  140 - 180 mg/dL   Lab Results  Component Value Date   GLUCAP 335 (H) 10/10/2021   HGBA1C 6.0 (H) 08/17/2021    Latest Reference Range & Units 10/10/21 04:34  GFR, Estimated >60 mL/min 19 (L)  (L): Data is abnormally low  Review of Glycemic Control  Diabetes history: DM2 Outpatient Diabetes medications: Lantus 10 units qd, Novolog 10 units tid meal coverage, Metformin 1 gm bid Current orders for Inpatient glycemic control: Semglee 10 units, Novolog 3 units tid meal coverage, Novolog correction 0-9 units, 0-5 units hs  Inpatient Diabetes Program Recommendations:   Received consult regarding insulin management. Patient states he takes his meal coverage part of the time and takes his Lantus every day. Discussed with patient options to have alarms on his phone to help patient to remember to take his insulin.  Please consider: -Increase Novolog meal coverage to 5 units tid if eats 50% -Increase Semglee to 12 units qd -GFR 19, so consider holding <Metformin on discharge. -Order [facility] Meter & Supplies # [id number] @ discharge  Thank you, Nani Gasser. Rynell Ciotti, RN, MSN, CDE  Diabetes Coordinator Inpatient Glycemic Control Team Team Pager [phone] (8am-5pm) 10/10/2021 1:00 PM

## 2021-10-10 NOTE — Progress Notes (Signed)
PROGRESS NOTE    Keith [name]  WUJ:[id number] DOB: February 12, 1966 DOA: 10/08/2021 [name], NP    Brief Narrative:  Keith [name] is a 56 y.o. male with past medical history of HIV/ AIDS on antiretroviral therapy, hypertension, hyperlipidemia, stage IV CKD with baseline creatinine of 2.8-3.4, type 2 diabetes mellitus, anemia of chronic disease, BPH presented to hospital with elevated blood glucose level.  Patient stated that he was spending his time outside for last 2 to 3 days and glucometer was reading high.  He reported good compliance with basal insulin but not with mealtime insulin.  Patient was hospitalized in May 2023 for hyperglycemia.  In the ED, vitals were stable.  Blood glucose level was more than 600.  Sodium was 123 and corrected to 133.  Anion gap 11.  CBC showed normal WBC count. Patient received LR bolus 2.5 L and insulin drip and was admitted to hospital for hyperglycemia and AKI.   Assessment and plan.  Principal Problem:   Hyperglycemia Active Problems:   HTN (hypertension)   Hyperlipidemia   HIV disease (HCC)   Acute renal failure superimposed on stage 4 chronic kidney disease (HCC)   Anemia   BPH (benign prostatic hyperplasia)   GERD (gastroesophageal reflux disease)   Hyperglycemia, uncontrolled type 2 diabetes.   -Likely secondary to suboptimal compliance with insulin at home.  Patient is stated that he did not know how to use sliding scale insulin, but reports use his long acting insulin -  - Latest hemoglobin A1c of 6.0% on 08/17/2021.  - Received IV fluids and insulin drip during hospitalization, now back on subQ insulin -Diabetic coordinator has been consulted.   -continue adjust insulin, will discontinue metformin due to renal impairment     Acute Kidney Injury on CKD 4 vs progressive CKD:  Likely secondary to hyperglycemia volume depletion.   Hold lisinopril, gabapentin metformin.   Good urine output, recent renal US from 08/2021 no acute  findings Ua with many bacteria, small leukocytes, + glucose, + protein He report follows nephrology and has an appointment soon  HIV/AIDS: Most recent CD4 count noted to be 129 on 08/17/2021.  On Dovato and Doravirine as an outpatient, noting good compliance with these antiviral medications.  Also on prophylactic dapsone.  Continue antiretroviral treatment     Essential Hypertension:  On Coreg, Norvasc and lisinopril as outpatient.  Hold lisinopril due to AKI.  Continue amlodipine and Coreg.  Hyperlipidemia: Continue statin and Vascepa.    GERD: Continue Protonix   Anemia of chronic disease:  Hemoglobin of 10-12.  We will continue to monitor.    Benign Prostatic Hyperplasia: Continue finasteride.  Left stump wound, Leg pain.  Patient takes Percocet at home.  Will resume while in the hospital. Reports ill fitting prothesis  Wound care consulted   MRSA colonization; decolinization  Homeless  Social worker consulted       DVT prophylaxis: heparin injection 5,000 Units Start: 10/09/21 1400   Code Status:     Code Status: Full Code  Disposition: Home likely in 1 to 2 days.  Status is: Inpatient  Remains inpatient appropriate because: Hyperglycemia, acute kidney injury, HIV.   Family Communication: Communicated with the patient at bedside  Consultants:  Wound care Diabetes RN  Procedures:  None  Antimicrobials:  None  Anti-infectives (From admission, onward)    Start     Dose/Rate Route Frequency Ordered Stop   10/09/21 1000  dapsone tablet 100 mg  100 mg Oral Daily 10/09/21 0129     10/09/21 1000  dolutegravir-lamiVUDine (DOVATO) 50-300 MG per tablet 1 tablet        1 tablet Oral Daily 10/09/21 0129     10/09/21 1000  doravirine (PIFELTRO) tablet 100 mg        100 mg Oral Daily 10/09/21 0129        Subjective: Today, patient was seen and examined at bedside.   Complain of weakness, Complains of pain in the legs.  Denies any nausea vomiting fever  chills or rigor.  Objective: Vitals:   10/09/21 1655 10/09/21 1945 10/10/21 0042 10/10/21 0437  BP: (!) 150/96 (!) 92/54 (!) 118/58 132/71  Pulse: 88 76 64 68  Resp: 18 17 14 14   Temp:  (!) 97.2 F (36.2 C) 98.3 F (36.8 C) 97.9 F (36.6 C)  TempSrc:  Oral Oral Oral  SpO2: 97% 100% 100% 100%  Weight:    123.8 kg  Height:        Intake/Output Summary (Last 24 hours) at 10/10/2021 0943 Last data filed at 10/10/2021 0900 Gross per 24 hour  Intake 2687.84 ml  Output 2425 ml  Net 262.84 ml   Filed Weights   10/08/21 1800 10/09/21 1403 10/10/21 0437  Weight: 124.8 kg 122.2 kg 123.8 kg    Physical Examination: Body mass index is 39.16 kg/m.  General: Obese built, not in obvious distress, on nasal cannula oxygen HENT:   No scleral pallor or icterus noted. Oral mucosa is moist.  Chest:  Clear breath sounds.  Diminished breath sounds bilaterally. No crackles or wheezes.  CVS: S1 &S2 heard. No murmur.  Regular rate and rhythm. Abdomen: Soft, nontender, nondistended.  Bowel sounds are heard.   Extremities: Bilateral below-knee amputation , left stump wound Psych: Alert, awake and oriented, normal mood CNS:  No cranial nerve deficits.  Moves extremities. Skin: Warm and dry.  No rashes noted.  Data Reviewed:   CBC: Recent Labs  Lab 10/08/21 1620 10/08/21 1743 10/09/21 0838 10/10/21 0434  WBC 8.1  --  8.5 6.7  NEUTROABS  --   --  6.0  --   HGB 12.4* 13.6 12.2* 10.9*  HCT 37.4* 40.0 36.4* 34.1*  MCV 90.8  --  88.8 89.7  PLT 247  --  244 401    Basic Metabolic Panel: Recent Labs  Lab 10/08/21 1620 10/08/21 1743 10/09/21 0838 10/09/21 1440 10/09/21 1820 10/10/21 0434  NA 123* 123* 136 135 135 133*  K 4.6 4.8 4.0 3.9 4.7 4.3  CL 94*  --  101 105 104 107  CO2 18*  --  25 19* 20* 19*  GLUCOSE 768*  --  176* 198* 328* 382*  BUN 47*  --  41* 44* 46* 46*  CREATININE 3.76*  --  3.34* 3.31* 3.39* 3.62*  CALCIUM 8.3*  --  8.9 8.8* 8.6* 8.1*  MG  --   --  1.8  --   --   1.9    Liver Function Tests: No results for input(s): "AST", "ALT", "ALKPHOS", "BILITOT", "PROT", "ALBUMIN" in the last 168 hours.   Radiology Studies: DG CHEST PORT 1 VIEW  Result Date: 10/09/2021 CLINICAL DATA:  Hyperglycemia. EXAM: PORTABLE CHEST 1 VIEW COMPARISON:  09/29/2020 FINDINGS: The heart size and mediastinal contours are within normal limits. Both lungs are clear. The visualized skeletal structures are unremarkable. IMPRESSION: No active disease. Electronically Signed   By: Kerby Moors M.D.   On: 10/09/2021 06:05  LOS: 2 days    Florencia Reasons, MD PhD FACP [facility] Available via Epic secure chat 7am-7pm After these hours, please refer to coverage provider listed on [email or web address] 10/10/2021, 9:43 AM

## 2021-10-10 NOTE — Consult Note (Signed)
   [facility] CM Inpatient Consult   10/10/2021  Keith [name] 01-06-1966 [id number]  Chart review for referral request, inpatient [facility]    Thank you for this referral.  Unfortunately, our Care management team will not be able to assist you with this patient. The patient is not on the current member enrollment rosters for any of the [place] risk contracted plans.    Please call if you need further clarification.   Reason:  Patient is not a beneficiary currently attributed to one of the [place] Registry populations.    Membership roster was used to verify patient status.    For additional questions or referrals please contact:    Natividad Brood, RN BSN [facility] Liaison  [phone] business mobile phone Toll free office [phone]  Fax number: [phone] [email or web address]

## 2021-10-11 DIAGNOSIS — R739 Hyperglycemia, unspecified: Secondary | ICD-10-CM | POA: Diagnosis not present

## 2021-10-11 LAB — GLUCOSE, CAPILLARY
Glucose-Capillary: 206 mg/dL — ABNORMAL HIGH (ref 70–99)
Glucose-Capillary: 178 mg/dL — ABNORMAL HIGH (ref 70–99)
Glucose-Capillary: 246 mg/dL — ABNORMAL HIGH (ref 70–99)
Glucose-Capillary: 297 mg/dL — ABNORMAL HIGH (ref 70–99)

## 2021-10-11 LAB — CBC WITH DIFFERENTIAL/PLATELET
Abs Immature Granulocytes: 0.03 10*3/uL (ref 0.00–0.07)
Basophils Absolute: 0.1 10*3/uL (ref 0.0–0.1)
Basophils Relative: 1 %
Eosinophils Absolute: 0.3 10*3/uL (ref 0.0–0.5)
Eosinophils Relative: 4 %
HCT: 35.5 % — ABNORMAL LOW (ref 39.0–52.0)
Hemoglobin: 11.2 g/dL — ABNORMAL LOW (ref 13.0–17.0)
Immature Granulocytes: 1 %
Lymphocytes Relative: 21 %
Lymphs Abs: 1.4 10*3/uL (ref 0.7–4.0)
MCH: 29 pg (ref 26.0–34.0)
MCHC: 31.5 g/dL (ref 30.0–36.0)
MCV: 92 fL (ref 80.0–100.0)
Monocytes Absolute: 0.5 10*3/uL (ref 0.1–1.0)
Monocytes Relative: 9 %
Neutro Abs: 4.1 10*3/uL (ref 1.7–7.7)
Neutrophils Relative %: 64 %
Platelets: 257 10*3/uL (ref 150–400)
RBC: 3.86 MIL/uL — ABNORMAL LOW (ref 4.22–5.81)
RDW: 12.7 % (ref 11.5–15.5)
WBC: 6.4 10*3/uL (ref 4.0–10.5)
nRBC: 0 % (ref 0.0–0.2)

## 2021-10-11 LAB — COMPREHENSIVE METABOLIC PANEL
ALT: 11 U/L (ref 0–44)
AST: 15 U/L (ref 15–41)
Albumin: 2.7 g/dL — ABNORMAL LOW (ref 3.5–5.0)
Alkaline Phosphatase: 96 U/L (ref 38–126)
Anion gap: 13 (ref 5–15)
BUN: 45 mg/dL — ABNORMAL HIGH (ref 6–20)
CO2: 18 mmol/L — ABNORMAL LOW (ref 22–32)
Calcium: 8.8 mg/dL — ABNORMAL LOW (ref 8.9–10.3)
Chloride: 103 mmol/L (ref 98–111)
Creatinine, Ser: 3.65 mg/dL — ABNORMAL HIGH (ref 0.61–1.24)
GFR, Estimated: 19 mL/min — ABNORMAL LOW (ref >60)
Glucose, Bld: 265 mg/dL — ABNORMAL HIGH (ref 70–99)
Potassium: 4.8 mmol/L (ref 3.5–5.1)
Sodium: 134 mmol/L — ABNORMAL LOW (ref 135–145)
Total Bilirubin: 0.4 mg/dL (ref 0.3–1.2)
Total Protein: 6.7 g/dL (ref 6.5–8.1)

## 2021-10-11 NOTE — NC FL2 (Addendum)
[place] LEVEL OF CARE SCREENING TOOL     IDENTIFICATION  Patient Name: Keith [name] Birthdate: 11-09-1965 Sex: male Admission Date (Current Location): 10/08/2021  [facility] and [place] Number:  Herbalist and Address:  [facility] [place], [place] [address] [place], [place] [address]      Provider Number: [id number]  Attending Physician Name and Address:  Florencia Reasons, MD  Relative Name and Phone Number:    Current Level of Care: Hospital Recommended Level of Care: SNF Prior Approval Number:    Date Approved/Denied:   PASRR Number:  [id number]  Discharge Plan: SNF    Current Diagnoses: Patient Active Problem List   Diagnosis Date Noted   BPH (benign prostatic hyperplasia)    GERD (gastroesophageal reflux disease)    Headache 08/22/2021   Anxiety 02/13/2021   Healthcare maintenance 02/13/2021   History of left above knee amputation ([place]) 02/07/2021   Wound dehiscence 01/03/2021   Coagulopathy ([place]) 12/22/2020   CAD (coronary artery disease) 12/22/2020   Obesity 12/22/2020   Gross hematuria 12/20/2020   ABLA (acute blood loss anemia) 12/20/2020   Dehiscence of amputation stump (HCC)    Hx of BKA, left ([place])    Cellulitis of left foot    Acute osteomyelitis of left calcaneus ([place])    Ulcer of heel due to diabetes mellitus ([place])    Anemia    Cellulitis 10/18/2020   Atypical chest pain 33/00/7622   Acute metabolic encephalopathy 63/33/5456   Hyperglycemia due to diabetes mellitus ([place]) 08/29/2020   Hyperglycemia 08/29/2020   Acute cerebrovascular accident (CVA) due to ischemia ([place]) 04/28/2020   Type 2 diabetes mellitus with hyperlipidemia (HCC)    PAD (peripheral artery disease) ([place])    Hypoglycemia 04/25/2020   Acute ST elevation myocardial infarction (STEMI) due to occlusion of circumflex coronary artery ([place]) 03/22/2020   Uncontrolled type 2 diabetes mellitus with hyperglycemia ([place]) 01/14/2019   Elevated glucose  01/14/2019   Hx of BKA, right ([place]) 01/14/2019   Pressure injury of skin 11/11/2018   STEMI (ST elevation myocardial infarction) ([place]) 11/10/2018   CKD (chronic kidney disease) stage 4, GFR 15-29 ml/min (HCC) 11/10/2018   Amputation stump infection ([place]) 10/28/2018   Type II diabetes mellitus with renal manifestations ([place]) 08/07/2018   Uncontrolled type 2 diabetes mellitus with hyperosmolar nonketotic hyperglycemia ([place]) 08/07/2018   Non-pressure chronic ulcer of right calf limited to breakdown of skin ([place]) 07/06/2018   Type 2 diabetes mellitus without complication, without long-term current use of insulin ([place]) 06/15/2018   Chronic low back pain without sciatica 06/15/2018   Idiopathic chronic venous hypertension of left lower extremity with ulcer and inflammation (HCC)    Venous stasis ulcer of left calf limited to breakdown of skin without varicose veins ([place]) 04/23/2018   Acquired absence of right leg below knee ([place]) 06/13/2017   Acute renal failure superimposed on stage 4 chronic kidney disease ([place]) 03/15/2017   Diabetic polyneuropathy associated with type 2 diabetes mellitus ([place]) 01/23/2017   AKI (acute kidney injury) ([place]) 09/28/2016   Nausea vomiting and diarrhea 09/28/2016   Onychomycosis of multiple toenails with type 2 diabetes mellitus ([name]) 08/29/2015   MRSA carrier 04/20/2014   Penile wart 02/22/2014   HIV disease ([place])    Insulin-requiring or dependent type II diabetes mellitus ([place]) 02/04/2014   Dental anomaly 11/20/2012   Arthritis of right knee 02/23/2012   Hyperlipidemia 11/10/2011   Hyponatremia 11/10/2011   Chronic pain 08/07/2011   Meralgia  paraesthetica 04/23/2011   ERECTILE DYSFUNCTION 08/22/2008   HTN (hypertension) 05/19/2008    Orientation RESPIRATION BLADDER Height & Weight     Self, Time, Situation, Place (WDL)  Normal Continent Weight: 274 lb 14.6 oz (124.7 kg) Height:  5\' 10"  (177.8 cm)  BEHAVIORAL SYMPTOMS/MOOD NEUROLOGICAL BOWEL NUTRITION  STATUS      Continent (WDL) Diet (Please see discharge summary)  AMBULATORY STATUS COMMUNICATION OF NEEDS Skin     Verbally Other (Comment) (Appropriate for ethnicity,wound incision open or dehiced knee,anterior,L,shallow crater on stump,foam lift dressing,dressing in place,wound incision open or dehiced,knee anterior,L,shallow open area on underside of stump,right please see additional info)                       Personal Care Assistance Level of Assistance  Bathing, Feeding, Dressing Bathing Assistance: Independent Feeding assistance: Independent (able to feed self; carb modified) Dressing Assistance: Independent     Functional Limitations Info  Sight, Hearing, Speech Sight Info: Adequate (WDL) Hearing Info: Adequate Speech Info: Adequate    SPECIAL CARE FACTORS FREQUENCY  PT (By licensed PT), OT (By licensed OT)                 Contractures Contractures Info: Not present    Additional Factors Info  Code Status, Allergies, Insulin Sliding Scale Isolation Precautions info: Code Status Info: FULL Allergies Info: Elemental Sulfur,Sulfa Antibiotics   Insulin Sliding Scale Info: insulin aspart (novoLOG) injection 0-5 Units daily at bedtime,insulin aspart (novoLOG) injection 0-9 Units 3 times daily with meals,insulin aspart (novoLOG) injection 3 Units 3 times daily with meals,insulin glargine-yfgn (SEMGLEE) injection 10 Units daily  Isolation precautions Info: ESBL onset date 10/03/21, MRSA onset date 10/09/21     Current Medications (10/11/2021):  This is the current hospital active medication list Current Facility-Administered Medications  Medication Dose Route Frequency Provider Last Rate Last Admin   acetaminophen (TYLENOL) tablet 650 mg  650 mg Oral Q6H PRN Howerter, Justin B, DO       Or   acetaminophen (TYLENOL) suppository 650 mg  650 mg Rectal Q6H PRN Howerter, Justin B, DO       amLODipine (NORVASC) tablet 10 mg  10 mg Oral Daily Pokhrel, Laxman, MD   10 mg at  10/11/21 1157   atorvastatin (LIPITOR) tablet 80 mg  80 mg Oral Daily Howerter, Justin B, DO   80 mg at 10/11/21 1156   carvedilol (COREG) tablet 3.125 mg  3.125 mg Oral BID WC Howerter, Justin B, DO   3.125 mg at 10/11/21 0820   Chlorhexidine Gluconate Cloth 2 % PADS 6 each  6 each Topical Q0600 Pokhrel, Laxman, MD   6 each at 10/11/21 0626   dapsone tablet 100 mg  100 mg Oral Daily Howerter, Justin B, DO   100 mg at 10/11/21 1158   dextrose 50 % solution 0-50 mL  0-50 mL Intravenous PRN Nelta Numbers, MD       dolutegravir-lamiVUDine (DOVATO) 50-300 MG per tablet 1 tablet  1 tablet Oral Daily Howerter, Justin B, DO   1 tablet at 10/11/21 1155   doravirine (PIFELTRO) tablet 100 mg  100 mg Oral Daily Howerter, Justin B, DO   100 mg at 10/11/21 1155   ferrous sulfate tablet 325 mg  325 mg Oral Q M,W,F-1800 Pokhrel, Laxman, MD   325 mg at 10/10/21 1726   finasteride (PROSCAR) tablet 5 mg  5 mg Oral Daily Howerter, Justin B, DO   5 mg at 10/11/21 1156  gabapentin (NEURONTIN) capsule 100 mg  100 mg Oral TID Pokhrel, Laxman, MD   100 mg at 10/11/21 1156   heparin injection 5,000 Units  5,000 Units Subcutaneous Q8H Howerter, Justin B, DO   5,000 Units at 10/11/21 8916   icosapent Ethyl (VASCEPA) 1 g capsule 2 g  2 g Oral BID Howerter, Justin B, DO   2 g at 10/11/21 1155   insulin aspart (novoLOG) injection 0-5 Units  0-5 Units Subcutaneous QHS Pokhrel, Laxman, MD   4 Units at 10/10/21 2238   insulin aspart (novoLOG) injection 0-9 Units  0-9 Units Subcutaneous TID WC Pokhrel, Laxman, MD   2 Units at 10/11/21 1225   insulin aspart (novoLOG) injection 3 Units  3 Units Subcutaneous TID WC Pokhrel, Laxman, MD   3 Units at 10/11/21 1225   insulin glargine-yfgn (SEMGLEE) injection 10 Units  10 Units Subcutaneous Daily Pokhrel, Laxman, MD   10 Units at 10/11/21 1155   mupirocin cream (BACTROBAN) 2 %   Topical Daily Florencia Reasons, MD   Given at 10/11/21 1159   mupirocin ointment (BACTROBAN) 2 % 1 Application  1  Application Nasal BID Pokhrel, Laxman, MD   1 Application at 94/50/38 1158   naloxone (NARCAN) injection 0.4 mg  0.4 mg Intravenous PRN Howerter, Justin B, DO       ondansetron (ZOFRAN) injection 4 mg  4 mg Intravenous Q6H PRN Howerter, Justin B, DO       Oral care mouth rinse  15 mL Mouth Rinse PRN Pokhrel, Laxman, MD       oxybutynin (DITROPAN) tablet 2.5 mg  2.5 mg Oral BID Pokhrel, Laxman, MD   2.5 mg at 10/11/21 1157   oxyCODONE-acetaminophen (PERCOCET/ROXICET) 5-325 MG per tablet 1 tablet  1 tablet Oral Q4H PRN Pokhrel, Laxman, MD   1 tablet at 10/11/21 1157   And   oxyCODONE (Oxy IR/ROXICODONE) immediate release tablet 5 mg  5 mg Oral Q4H PRN Pokhrel, Laxman, MD   5 mg at 10/11/21 1157   pantoprazole (PROTONIX) EC tablet 40 mg  40 mg Oral Daily Howerter, Justin B, DO   40 mg at 10/11/21 1158   sodium bicarbonate tablet 650 mg  650 mg Oral BID Howerter, Justin B, DO   650 mg at 10/11/21 1156     Discharge Medications: Please see discharge summary for a list of discharge medications.  Relevant Imaging Results:  Relevant Lab Results:   Additional Information [id number],Wound incision open or dehiced,Irritant dermatitis,MASD,scrotum,L,open area,left side of scrotum  Milas Gain, LCSWA

## 2021-10-11 NOTE — Progress Notes (Signed)
PROGRESS NOTE    Keith [name]  QZR:[id number] DOB: 07-31-1965 DOA: 10/08/2021 [name], NP    Brief Narrative:  Keith [name] is a 56 y.o. male with past medical history of HIV/ AIDS on antiretroviral therapy, hypertension, hyperlipidemia, stage IV CKD with baseline creatinine of 2.8-3.4, type 2 diabetes mellitus, anemia of chronic disease, BPH presented to hospital with elevated blood glucose level.  Patient stated that he was spending his time outside for last 2 to 3 days and glucometer was reading high.  He reported good compliance with basal insulin but not with mealtime insulin.  Patient was hospitalized in May 2023 for hyperglycemia.  In the ED, vitals were stable.  Blood glucose level was more than 600.  Sodium was 123 and corrected to 133.  Anion gap 11.  CBC showed normal WBC count. Patient received LR bolus 2.5 L and insulin drip and was admitted to hospital for hyperglycemia and AKI.   Assessment and plan.  Principal Problem:   Hyperglycemia Active Problems:   HTN (hypertension)   Hyperlipidemia   HIV disease (HCC)   Acute renal failure superimposed on stage 4 chronic kidney disease (HCC)   Anemia   BPH (benign prostatic hyperplasia)   GERD (gastroesophageal reflux disease)   Hyperglycemia, uncontrolled type 2 diabetes.   -Likely secondary to suboptimal compliance with insulin at home.  Patient is stated that he did not know how to use sliding scale insulin, but reports use his long acting insulin -  - Latest hemoglobin A1c of 6.0% on 08/17/2021.  - Received IV fluids and insulin drip during hospitalization, now back on subQ insulin -Diabetic coordinator has been consulted.   -continue adjust insulin, will discontinue metformin due to renal impairment     Acute Kidney Injury on CKD 4 vs progressive CKD:  Likely secondary to hyperglycemia volume depletion.   Hold lisinopril, gabapentin metformin.   Good urine output, recent renal US from 08/2021 no acute  findings Ua with many bacteria, small leukocytes, + glucose, + protein He report follows nephrology and has an appointment soon  HIV/AIDS: Most recent CD4 count noted to be 129 on 08/17/2021.  On Dovato and Doravirine as an outpatient, noting good compliance with these antiviral medications.  Also on prophylactic dapsone.  Continue antiretroviral treatment     Essential Hypertension:  On Coreg, Norvasc and lisinopril as outpatient.  Hold lisinopril due to AKI.  Continue amlodipine and Coreg.  Hyperlipidemia: Continue statin and Vascepa.    GERD: Continue Protonix   Anemia of chronic disease:  Hemoglobin of 10-12.  We will continue to monitor.    Benign Prostatic Hyperplasia: Continue finasteride.  Left stump wound, Leg pain.  Patient takes Percocet at home.  Will resume while in the hospital. Reports ill fitting prothesis  Wound care consulted   MRSA colonization; decolinization  Homeless  Right BKA, left AKA, reports prosthesis does not fit well, has a walker and wheelchair in his car Reports can drive to him appointment Social worker consulted       DVT prophylaxis: heparin injection 5,000 Units Start: 10/09/21 1400   Code Status:     Code Status: Full Code  Disposition: ALF vs snf  Status is: Inpatient  Remains inpatient appropriate because: remain weak, homeless, awaiting for placement, awaiting for culture result    Family Communication: Communicated with the patient at bedside  Consultants:  Wound care Diabetes RN  Procedures:  None  Antimicrobials:  None  Anti-infectives (From admission, onward)  Start     Dose/Rate Route Frequency Ordered Stop   10/09/21 1000  dapsone tablet 100 mg        100 mg Oral Daily 10/09/21 0129     10/09/21 1000  dolutegravir-lamiVUDine (DOVATO) 50-300 MG per tablet 1 tablet        1 tablet Oral Daily 10/09/21 0129     10/09/21 1000  doravirine (PIFELTRO) tablet 100 mg        100 mg Oral Daily 10/09/21 0129         Subjective: Today, patient was seen and examined at bedside.   Continue to Complain of weakness, Complains of pain in the legs.   Urine has odor Denies any nausea vomiting fever chills or rigor. He does not feel well enough to be discharged today, urine culture in process  Objective: Vitals:   10/10/21 2054 10/11/21 0500 10/11/21 0527 10/11/21 0820  BP: 140/88  (!) 142/85   Pulse: 73   84  Resp: 18     Temp:   98.1 F (36.7 C)   TempSrc:   Oral   SpO2:      Weight:  124.7 kg    Height:        Intake/Output Summary (Last 24 hours) at 10/11/2021 0943 Last data filed at 10/11/2021 0527 Gross per 24 hour  Intake --  Output 2150 ml  Net -2150 ml   Filed Weights   10/09/21 1403 10/10/21 0437 10/11/21 0500  Weight: 122.2 kg 123.8 kg 124.7 kg    Physical Examination: Body mass index is 39.45 kg/m.  General: Obese built, not in obvious distress, on nasal cannula oxygen HENT:   No scleral pallor or icterus noted. Oral mucosa is moist.  Chest:  Clear breath sounds.  Diminished breath sounds bilaterally. No crackles or wheezes.  CVS: S1 &S2 heard. No murmur.  Regular rate and rhythm. Abdomen: Soft, nontender, nondistended.  Bowel sounds are heard.   Extremities: Bilateral below-knee amputation , left stump wound Psych: Alert, awake and oriented, normal mood CNS:  No cranial nerve deficits.  Moves extremities. Skin: Warm and dry.  No rashes noted.  Data Reviewed:   CBC: Recent Labs  Lab 10/08/21 1620 10/08/21 1743 10/09/21 0838 10/10/21 0434 10/11/21 0230  WBC 8.1  --  8.5 6.7 6.4  NEUTROABS  --   --  6.0  --  4.1  HGB 12.4* 13.6 12.2* 10.9* 11.2*  HCT 37.4* 40.0 36.4* 34.1* 35.5*  MCV 90.8  --  88.8 89.7 92.0  PLT 247  --  244 240 683    Basic Metabolic Panel: Recent Labs  Lab 10/09/21 0838 10/09/21 1440 10/09/21 1820 10/10/21 0434 10/11/21 0230  NA 136 135 135 133* 134*  K 4.0 3.9 4.7 4.3 4.8  CL 101 105 104 107 103  CO2 25 19* 20* 19* 18*  GLUCOSE  176* 198* 328* 382* 265*  BUN 41* 44* 46* 46* 45*  CREATININE 3.34* 3.31* 3.39* 3.62* 3.65*  CALCIUM 8.9 8.8* 8.6* 8.1* 8.8*  MG 1.8  --   --  1.9  --     Liver Function Tests: Recent Labs  Lab 10/11/21 0230  AST 15  ALT 11  ALKPHOS 96  BILITOT 0.4  PROT 6.7  ALBUMIN 2.7*     Radiology Studies: No results found.    LOS: 3 days    Florencia Reasons, MD PhD FACP [facility] Available via Epic secure chat 7am-7pm After these hours, please refer to coverage provider listed  on [email or web address] 10/11/2021, 9:43 AM

## 2021-10-11 NOTE — Care Management Important Message (Signed)
Important Message  Patient Details  Name: Keith [name] MRN: [id number] Date of Birth: February 02, 1966   Medicare Important Message Given:  Yes     Shelda Altes 10/11/2021, 11:22 AM

## 2021-10-11 NOTE — Progress Notes (Signed)
Mobility Specialist Progress Note    10/11/21 1605  Mobility  Activity Transferred from bed to chair  Level of Assistance Contact guard assist, steadying assist  Assistive Device Other (Comment) (HHA)  Activity Response Tolerated well  $Mobility charge 1 Mobility   Pre-Mobility: 68 HR Post-Mobility: 70 HR  Declining further ambulation d/t phantom pain. Left with call bell in reach.   Hildred Alamin Mobility Specialist

## 2021-10-11 NOTE — TOC Progression Note (Addendum)
Transition of Care [facility]) - Progression Note    Patient Details  Name: Keith [name] MRN: [id number] Date of Birth: [birth year]  Transition [facility] Care [facility]) CM/SW [place] Phone Number: 10/11/2021, 12:08 PM  Clinical Narrative:     Update- CSW attempted to call [facility] several times and and LVM . CSW called main number to ALF. CSW was informed [facility] left for the day. CSW will call back tomorrow to check on determination of referral. CM informed CSW still awaiting callback from [employer] case manager regarding housing assistance for patient. Patient gave CSW permission to fax referral to ALF. CSW spoke with Onalee Hua with [facility]. CSW faxed over referral for review. CSW awaiting determination.TOC will continue to follow.   Update- CSW received callback from Jimmie Molly with [facility] unable to make offer for ALF placement they are 57 years old and over community. CSW awaiting determination for [employer].  CSW awaiting callback from [facility] to see if they can offer ALF bed for patient. CSW left voicemail for [facility] on ALF referral determination. CSW will continue to follow.  Expected Discharge Plan: Homeless Shelter Barriers to Discharge: Continued Medical Work up (Homeless)  Expected Discharge Plan and Services Expected Discharge Plan: Homeless Shelter In-house Referral: Clinical Social Work Discharge Planning Services: CM Consult Post Acute Care Choice: NA Living arrangements for the past 2 months: No permanent address (trying to seek out boarding homes.)                                       Social Determinants of Health (SDOH) Interventions    Readmission Risk Interventions    10/10/2021    1:31 PM 10/21/2020   11:53 AM 08/31/2020    2:30 PM  Readmission Risk Prevention Plan  Transportation Screening Complete Complete   PCP or Specialist Appt within 3-5 Days     HRI or [place]  Work Consult for [place] Planning/Counseling     Palliative Care Screening     Medication Review Press photographer) Complete Complete   PCP or Specialist appointment within 3-5 days of discharge Complete Complete   HRI or [place] Complete Complete   SW Recovery Care/Counseling Consult Complete Complete   Palliative Care Screening Not Applicable Not [place] Not Applicable Complete      Information is confidential and restricted. Go to Review Flowsheets to unlock data.

## 2021-10-11 NOTE — Plan of Care (Signed)
  Problem: Clinical Measurements: Goal: Ability to maintain clinical measurements within normal limits will improve Outcome: Progressing Goal: Will remain free from infection Outcome: Progressing Goal: Respiratory complications will improve Outcome: Progressing Goal: Cardiovascular complication will be avoided Outcome: Progressing   

## 2021-10-12 ENCOUNTER — Inpatient Hospital Stay (HOSPITAL_COMMUNITY): Payer: Medicare Other

## 2021-10-12 DIAGNOSIS — R739 Hyperglycemia, unspecified: Secondary | ICD-10-CM | POA: Diagnosis not present

## 2021-10-12 LAB — URINALYSIS, ROUTINE W REFLEX MICROSCOPIC
Bilirubin Urine: NEGATIVE
Glucose, UA: 50 mg/dL — AB
Ketones, ur: NEGATIVE mg/dL
Nitrite: NEGATIVE
Protein, ur: >=300 mg/dL — AB
Specific Gravity, Urine: 1.013 (ref 1.005–1.030)
WBC, UA: >50 WBC/hpf — ABNORMAL HIGH (ref 0–5)
pH: 5 (ref 5.0–8.0)

## 2021-10-12 LAB — BASIC METABOLIC PANEL
Anion gap: 11 (ref 5–15)
BUN: 49 mg/dL — ABNORMAL HIGH (ref 6–20)
CO2: 19 mmol/L — ABNORMAL LOW (ref 22–32)
Calcium: 8.8 mg/dL — ABNORMAL LOW (ref 8.9–10.3)
Chloride: 104 mmol/L (ref 98–111)
Creatinine, Ser: 4.02 mg/dL — ABNORMAL HIGH (ref 0.61–1.24)
GFR, Estimated: 17 mL/min — ABNORMAL LOW (ref >60)
Glucose, Bld: 230 mg/dL — ABNORMAL HIGH (ref 70–99)
Potassium: 4.6 mmol/L (ref 3.5–5.1)
Sodium: 134 mmol/L — ABNORMAL LOW (ref 135–145)

## 2021-10-12 LAB — GLUCOSE, CAPILLARY
Glucose-Capillary: 208 mg/dL — ABNORMAL HIGH (ref 70–99)
Glucose-Capillary: 231 mg/dL — ABNORMAL HIGH (ref 70–99)
Glucose-Capillary: 245 mg/dL — ABNORMAL HIGH (ref 70–99)
Glucose-Capillary: 228 mg/dL — ABNORMAL HIGH (ref 70–99)

## 2021-10-12 MED ORDER — LACTATED RINGERS IV SOLN: INTRAVENOUS | Status: AC

## 2021-10-12 MED ORDER — SENNOSIDES-DOCUSATE SODIUM 8.6-50 MG PO TABS: 1.0000 | ORAL_TABLET | Freq: Every day | ORAL | Status: DC

## 2021-10-12 MED ORDER — SODIUM CHLORIDE 0.9 % IV SOLN
1.0000 g | INTRAVENOUS | Status: DC
  Administered 2021-10-12 – 2021-10-16 (×5): 1 g via INTRAVENOUS
  Filled 2021-10-12 (×5): qty 10

## 2021-10-12 MED ORDER — INSULIN GLARGINE-YFGN 100 UNIT/ML ~~LOC~~ SOLN
12.0000 [IU] | Freq: Every day | SUBCUTANEOUS | Status: DC
  Administered 2021-10-13 – 2021-10-16 (×4): 12 [IU] via SUBCUTANEOUS
  Filled 2021-10-12 (×4): qty 0.12

## 2021-10-12 NOTE — Inpatient Diabetes Management (Signed)
Latest Reference Range & Units 10/11/21 16:41 10/11/21 21:27 10/12/21 07:40 10/12/21 11:46  Glucose-Capillary 70 - 99 mg/dL 246 (H) 297 (H) 208 (H) 231 (H)  (H): Data is abnormally high  Noted CBGs have been greater than 200 mg/dl.   Recommend increasing Semglee to 12 units daily if blood sugars continue to be elevated. Titrate dosages as needed.   [name] RN BSN CDE Diabetes Coordinator Pager: [phone]  8am-5pm

## 2021-10-12 NOTE — TOC Progression Note (Signed)
Transition of Care [facility]) - Progression Note    Patient Details  Name: Keith [name] MRN: [id number] Date of Birth: 23-Nov-1965  [facility]) CM/SW Contact  Loletha Grayer Beverely Pace, RN Phone Number: 10/12/2021, 3:44 PM  Clinical Narrative:   Case manager spoke with Brita Romp, director of Care and wellness, [place], she is working on application for [facility].  She states she is and has been working with patient to obtain housing, process has been hindered by his frequent hospitalizations. MS. Harrington Challenger confirmed that patient returning to stay with his mother is not an option. CM updated Ebony Hail, Education officer, museum with the above information. TOC Team will continue to follow.    Expected Discharge Plan: Homeless Shelter Barriers to Discharge: Continued Medical Work up (Homeless)  Expected Discharge Plan and Services Expected Discharge Plan: Homeless Shelter In-house Referral: Clinical Social Work Discharge Planning Services: CM Consult Post Acute Care Choice: NA Living arrangements for the past 2 months: No permanent address (trying to seek out boarding homes.)                                       Social Determinants of Health (SDOH) Interventions    Readmission Risk Interventions    10/10/2021    1:31 PM 10/21/2020   11:53 AM 08/31/2020    2:30 PM  Readmission Risk Prevention Plan  Transportation Screening Complete Complete   PCP or Specialist Appt within 3-5 Days     HRI or [place] Work Consult for [place] Planning/Counseling     Palliative Care Screening     Medication Review Press photographer) Complete Complete   PCP or Specialist appointment within 3-5 days of discharge Complete Complete   HRI or [place] Complete Complete   SW Recovery Care/Counseling Consult Complete Complete   Palliative Care Screening Not Applicable Not [place] Not Applicable Complete       Information is confidential and restricted. Go to Review Flowsheets to unlock data.

## 2021-10-12 NOTE — Progress Notes (Signed)
Mobility Specialist Progress Note    10/12/21 1441  Mobility  Activity Transferred from bed to chair  Level of Assistance Contact guard assist, steadying assist  Assistive Device Other (Comment) (HHA)  Activity Response Tolerated well  $Mobility charge 1 Mobility   Pt received and agreeable. Declining further ambulation until he can clean his legs sleeves. Left with call bell in reach and basins to clean.   Hildred Alamin Mobility Specialist

## 2021-10-12 NOTE — Consult Note (Signed)
Reason for Consult: Acute kidney injury on chronic kidney disease stage IV Referring Physician: Florencia Reasons MD [facility])  HPI:  56 year old man with past medical history significant for hypertension, type 2 diabetes mellitus with poor control, dyslipidemia, coronary artery disease status post stenting, history of CVA, coronary artery disease status post PTCA, HIV infection, peripheral vascular disease status post right below-knee amputation and left above-knee amputation with chronic kidney disease stage IV at baseline (creatinine 2.8-3.4).  He was admitted to the hospital 4 days ago with hyperglycemia and volume contraction exacerbated by spending a lot of time outdoors.  He is being treated for Klebsiella urinary tract infection with ceftriaxone and concern is raised with his rising creatinine that is now up to 4.0 in the setting of episodes of hyperglycemia without any hypotension.  He has not had any iodinated intravenous contrast exposure or NSAIDs and renal ultrasound done earlier today shows mild hydronephrosis.  He denies any preceding nausea or vomiting but reports that he was not taking his medications as prescribed (including lisinopril and sodium bicarbonate).  He denies any chest pain or shortness of breath.  He has a slowly healing ulcer/scab over his left AKA.  He reports good compliance with his antiretrovirals (Dovato and Pifeltro).  Past Medical History:  Diagnosis Date   Abscess of right foot    abscess/ulcer of R transtibial amputation requiring IV abx   AIDS ([place])    Anemia    CAD (coronary artery disease)    a. MI with stenting of OM1 in 11/2018 with residual disease. b. acute STEMI 03/2020 s/p DES to OM1   Chronic anemia    Chronic knee pain    right   Chronic pain    CKD (chronic kidney disease), stage IV (HCC)    CVA (cerebral vascular accident) ([place]) 04/2020   Diabetes type 2, uncontrolled    HgA1c 17.6 (04/27/2010)   Diabetic foot ulcer ([place]) 01/2017   right foot    Dilatation of aorta (HCC)    Erectile dysfunction    Genital warts    GERD (gastroesophageal reflux disease)    History of blood transfusion    HIV (human immunodeficiency virus infection) ([place]) 2009   CD4 count 100, VL 13800 (05/01/2010)   Hyperlipidemia    Hypertension    Myocardial infarction ([place])    Neuropathy    Noncompliance with medication regimen    Osteomyelitis ([place])    h/o hand   Osteomyelitis of metatarsal ([place]) 04/28/2017   Pneumonia     Past Surgical History:  Procedure Laterality Date   AMPUTATION Right 05/02/2017   Procedure: AMPUTATION TRANSMETARSAL;  Surgeon: [name], MD;  Location: [place];  Service: Orthopedics;  Laterality: Right;   AMPUTATION Right 06/13/2017   Procedure: RIGHT BELOW KNEE AMPUTATION;  Surgeon: [name], MD;  Location: [place];  Service: Orthopedics;  Laterality: Right;   AMPUTATION Left 10/27/2020   Procedure: LEFT BELOW KNEE AMPUTATION;  Surgeon: [name], MD;  Location: [place];  Service: Orthopedics;  Laterality: Left;   AMPUTATION Left 11/17/2020   Procedure: REVISION AMPUTATION BELOW KNEE, LEFT;  Surgeon: [name], MD;  Location: [place];  Service: Orthopedics;  Laterality: Left;   AMPUTATION Left 01/03/2021   Procedure: LEFT ABOVE KNEE AMPUTATION;  Surgeon: [name], MD;  Location: [place];  Service: Orthopedics;  Laterality: Left;   APPLICATION OF WOUND VAC Left 10/27/2020   Procedure: APPLICATION OF WOUND VAC;  Surgeon: [name], MD;  Location:  [place] OR;  Service: Orthopedics;  Laterality: Left;   BELOW KNEE LEG AMPUTATION Right 06/13/2017   BELOW KNEE LEG AMPUTATION     CORONARY STENT INTERVENTION N/A 11/10/2018   Procedure: CORONARY STENT INTERVENTION;  Surgeon: Leonie Man, MD;  Location: [place] [facility];  Service: Cardiovascular;  Laterality: N/A;   CORONARY/GRAFT ACUTE MI REVASCULARIZATION N/A 03/21/2020   Procedure: Coronary/Graft Acute MI Revascularization;  Surgeon: [name], MD;  Location: [place] [facility];  Service: Cardiovascular;  Laterality: N/A;   CYSTOSCOPY N/A 11/09/2020   Procedure: CYSTOSCOPY, EVACUATION OF CLOTS, FULGERATION OF BLADDER AND BILATERIAL RETROGRADE PYELOGRAMS;  Surgeon: Janith Lima, MD;  Location: [place];  Service: Urology;  Laterality: N/A;   HERNIA REPAIR     I & D EXTREMITY Left 08/21/2014   Procedure: INCISION AND DRAINAGE LEFT SMALL FINGER;  Surgeon: Leanora Cover, MD;  Location: [place];  Service: Orthopedics;  Laterality: Left;   I & D EXTREMITY Right 03/18/2017   Procedure: IRRIGATION AND DEBRIDEMENT EXTREMITY;  Surgeon: [name], MD;  Location: [place];  Service: Orthopedics;  Laterality: Right;   IR FLUORO GUIDE CV LINE RIGHT  11/02/2020   IR REMOVAL TUN CV CATH W/O FL  11/13/2020   IR US GUIDE VASC ACCESS RIGHT  11/02/2020   LEFT HEART CATH AND CORONARY ANGIOGRAPHY N/A 11/10/2018   Procedure: LEFT HEART CATH AND CORONARY ANGIOGRAPHY;  Surgeon: Leonie Man, MD;  Location: [place] [facility];  Service: Cardiovascular;  Laterality: N/A;   LEFT HEART CATH AND CORONARY ANGIOGRAPHY N/A 03/21/2020   Procedure: LEFT HEART CATH AND CORONARY ANGIOGRAPHY;  Surgeon: [name], MD;  Location: [place] [facility];  Service: Cardiovascular;  Laterality: N/A;   MINOR IRRIGATION AND DEBRIDEMENT OF WOUND Right 04/22/2014   Procedure: IRRIGATION AND DEBRIDEMENT OF RIGHT NECK ABCESS;  Surgeon: Jerrell Belfast, MD;  Location: [place];  Service: ENT;  Laterality: Right;   MULTIPLE EXTRACTIONS WITH ALVEOLOPLASTY N/A 01/18/2013   Procedure: MULTIPLE EXTRACION 3, 6, 7, 10, 11, 13, 21, 22, 27, 28, 29, 30 WITH ALVEOLOPLASTY;  Surgeon: Gae Bon, DDS;  Location: [place];  Service: Oral Surgery;  Laterality: N/A;   SKIN SPLIT GRAFT Right 03/21/2017   Procedure: IRRIGATION AND DEBRIDEMENT RIGHT FOOT AND APPLY SPLIT THICKNESS SKIN GRAFT AND WOUND VAC;  Surgeon: [name], MD;  Location: [place];  Service: Orthopedics;  Laterality: Right;   STUMP REVISION Left 12/02/2020    Procedure: REVISION LEFT BELOW KNEE AMPUTATION;  Surgeon: [name], MD;  Location: [place];  Service: Orthopedics;  Laterality: Left;   TEE WITHOUT CARDIOVERSION N/A 05/02/2017   Procedure: TRANSESOPHAGEAL ECHOCARDIOGRAM (TEE);  Surgeon: Acie Fredrickson Wonda Cheng, MD;  [facility];  Service: Cardiovascular;  Laterality: N/A;  coincidental to orthopedic case    Family History  Problem Relation Age of Onset   Hypertension Mother    Arthritis Father    Hypertension Father    Hypertension Brother    Cancer Maternal Grandmother 54       unknown type of cancer   Depression Paternal Grandmother     Social History:  reports that he has never smoked. He has never used smokeless tobacco. He reports that he does not currently use alcohol. He reports that he does not use drugs.  Allergies:  Allergies  Allergen Reactions   Elemental Sulfur Itching    Patient stated he's allergic to "sulfur" AND "sulfa"   Sulfa Antibiotics Itching  Medications: I have reviewed the patient's current medications. Scheduled:  amLODipine  10 mg Oral Daily   atorvastatin  80 mg Oral Daily   carvedilol  3.125 mg Oral BID WC   Chlorhexidine Gluconate Cloth  6 each Topical Q0600   dapsone  100 mg Oral Daily   dolutegravir-lamiVUDine  1 tablet Oral Daily   doravirine  100 mg Oral Daily   ferrous sulfate  325 mg Oral Q M,W,F-1800   finasteride  5 mg Oral Daily   gabapentin  100 mg Oral TID   heparin injection (subcutaneous)  5,000 Units Subcutaneous Q8H   icosapent Ethyl  2 g Oral BID   insulin aspart  0-5 Units Subcutaneous QHS   insulin aspart  0-9 Units Subcutaneous TID WC   insulin aspart  3 Units Subcutaneous TID WC   [START ON 10/13/2021] insulin glargine-yfgn  12 Units Subcutaneous Daily   mupirocin cream   Topical Daily   mupirocin ointment  1 Application Nasal BID   pantoprazole  40 mg Oral Daily   senna-docusate  1 tablet Oral QHS   sodium bicarbonate  650 mg Oral BID   Continuous:  cefTRIAXone  (ROCEPHIN)  IV 1 g (10/12/21 0958)   lactated ringers        Latest Ref Rng & Units 10/12/2021    3:16 AM 10/11/2021    2:30 AM 10/10/2021    4:34 AM  BMP  Glucose 70 - 99 mg/dL 230  265  382   BUN 6 - 20 mg/dL 49  45  46   Creatinine 0.61 - 1.24 mg/dL 4.02  3.65  3.62   Sodium 135 - 145 mmol/L 134  134  133   Potassium 3.5 - 5.1 mmol/L 4.6  4.8  4.3   Chloride 98 - 111 mmol/L 104  103  107   CO2 22 - 32 mmol/L 19  18  19    Calcium 8.9 - 10.3 mg/dL 8.8  8.8  8.1        Latest Ref Rng & Units 10/11/2021    2:30 AM 10/10/2021    4:34 AM 10/09/2021    8:38 AM  CBC  WBC 4.0 - 10.5 K/uL 6.4  6.7  8.5   Hemoglobin 13.0 - 17.0 g/dL 11.2  10.9  12.2   Hematocrit 39.0 - 52.0 % 35.5  34.1  36.4   Platelets 150 - 400 K/uL 257  240  244   Urinalysis    Component Value Date/Time   COLORURINE YELLOW 10/08/2021 1447   APPEARANCEUR HAZY (A) 10/08/2021 1447   LABSPEC 1.017 10/08/2021 1447   PHURINE 5.0 10/08/2021 1447   GLUCOSEU >=500 (A) 10/08/2021 1447   HGBUR NEGATIVE 10/08/2021 1447   BILIRUBINUR NEGATIVE 10/08/2021 1447   BILIRUBINUR negative 09/07/2018 1034   KETONESUR NEGATIVE 10/08/2021 1447   PROTEINUR 100 (A) 10/08/2021 1447   UROBILINOGEN 1.0 09/07/2018 1034   UROBILINOGEN 1.0 01/01/2017 1032   NITRITE NEGATIVE 10/08/2021 1447   LEUKOCYTESUR SMALL (A) 10/08/2021 1447      US RENAL  Result Date: 10/12/2021 CLINICAL DATA:  AKI EXAM: RENAL / URINARY TRACT ULTRASOUND COMPLETE COMPARISON:  Renal ultrasound dated Aug 22, 2021 FINDINGS: Right Kidney: Renal measurements: 11.3 x 5.1 x 6.6 cm = volume: 138 mL. Increased parenchymal echogenicity no mass. Mild hydronephrosis. Left Kidney: Renal measurements: 11.0 x 5.2 x 4.8 cm = volume: 144 mL. Increased parenchymal echogenicity no mass. Mild hydronephrosis. Bladder: Appears normal for degree of bladder distention. Bilateral ureter jets visualized.  Other: Prostatomegaly, measuring up to 4.6 cm IMPRESSION: 1. Mild bilateral hydronephrosis. 2.  Bilateral increased parenchymal echogenicity, findings can be seen in the setting of medical renal disease. 3. Prostatomegaly. Electronically Signed   By: Yetta Glassman M.D.   On: 10/12/2021 12:13    Review of Systems  Constitutional:  Positive for fatigue. Negative for chills and fever.  HENT:  Negative for nosebleeds, sinus pressure, sore throat and trouble swallowing.   Eyes:  Negative for photophobia and visual disturbance.  Respiratory:  Negative for cough, chest tightness and shortness of breath.   Cardiovascular:  Negative for chest pain and palpitations.  Gastrointestinal:  Negative for abdominal pain, diarrhea, nausea and vomiting.  Endocrine: Positive for cold intolerance. Negative for polydipsia and polyuria.  Genitourinary:  Negative for dysuria, frequency, hematuria and urgency.  Musculoskeletal:  Negative for arthralgias and back pain.  Skin:  Positive for wound.       Over left above-knee amputation, healing slowly  Neurological:  Negative for dizziness, weakness and headaches.   Blood pressure 124/77, pulse 69, temperature 97.6 F (36.4 C), temperature source Oral, resp. rate 17, height 5\' 10"  (1.778 m), weight 124.3 kg, SpO2 96 %. Physical Exam Vitals reviewed.  Constitutional:      Appearance: Normal appearance. He is obese. He is not ill-appearing or toxic-appearing.  HENT:     Head: Normocephalic and atraumatic.     Right Ear: External ear normal.     Left Ear: External ear normal.     Nose: Nose normal.     Mouth/Throat:     Mouth: Mucous membranes are dry.     Pharynx: Oropharynx is clear.  Eyes:     General: No scleral icterus.    Extraocular Movements: Extraocular movements intact.     Conjunctiva/sclera: Conjunctivae normal.  Cardiovascular:     Rate and Rhythm: Normal rate and regular rhythm.     Pulses: Normal pulses.     Heart sounds: Normal heart sounds.  Pulmonary:     Effort: Pulmonary effort is normal.     Breath sounds: Normal breath sounds.  No wheezing or rales.  Abdominal:     General: Abdomen is flat. Bowel sounds are normal.     Palpations: Abdomen is soft.     Tenderness: There is no abdominal tenderness. There is no guarding.  Musculoskeletal:     Cervical back: Neck supple.     Comments: Status post right below-knee amputation and left above-knee amputation.  Dressing noted over left AKA stump site with healing scab  Skin:    General: Skin is warm and dry.  Neurological:     Mental Status: He is alert and oriented to person, place, and time. Mental status is at baseline.     Assessment/Plan: 1.  Acute kidney injury on chronic kidney disease stage IV: Based on the history, timeline of events and available database, this appears to be largely hemodynamically mediated in the setting of intercurrent hyperglycemia with osmotic diuresis.  I will start him on lactated Ringer's at 125 cc/h for the next 20 hours and send off repeat urinalysis.  He is hemodynamically stable and additional renal recovery will be predicated on optimizing glycemic control.  I pointed out his mild hydronephrosis and encouraged him to void urine (instead of holding it like he does) as soon as he had the urge to do so.  He does not have any acute electrolyte abnormality or indications for dialysis at this time. Avoid nephrotoxic medications including NSAIDs and  iodinated intravenous contrast exposure unless the latter is absolutely indicated.  Preferred narcotic agents for pain control are hydromorphone, fentanyl, and methadone. Morphine should not be used. Avoid Baclofen and avoid oral sodium phosphate and magnesium citrate based laxatives / bowel preps. Continue strict Input and Output monitoring. Will monitor the patient closely with you and intervene or adjust therapy as indicated by changes in clinical status/labs. 2.  Klebsiella urinary tract infection: No markers of sepsis noted, ongoing treatment with intravenous ceftriaxone as sensitivities pending. 3.   Hyperglycemia: With background history of poorly controlled type 2 diabetes mellitus and likely exacerbated by recent UTI.  Glycemic management per primary service. 4.  Anion gap metabolic acidosis: This appears to be largely from his acute kidney injury, continue sodium bicarbonate and treat hyperglycemia with efforts at improving renal function with fluids. 5.  Hyponatremia: This appears to be largely pseudohyponatremia in the setting of hyperglycemia, monitor sodium with glycemic control and intravenous fluids.  Jamielynn Wigley K. 10/12/2021, 3:35 PM

## 2021-10-12 NOTE — TOC Progression Note (Addendum)
Transition of Care [facility]) - Progression Note    Patient Details  Name: FREDRIK MOGEL MRN: [id number] Date of Birth: [birth year]  Transition of Care [facility]) CM/SW [place], [place] Phone Number: 10/12/2021, 1:31 PM  Clinical Narrative:     Update 4:23- CSW awaiting callback from [facility] with [facility] on possible ALF placement. CSW awaiting callback from Hydia with [facility] on possible ALF placement. No Current ALF Bed offers. TOC will continue to follow and follow up.  Update- 3:29pm- CSW called Hazel with [place]. Hazel informed CSW will return call shortly with determination. CSW awaiting callback.  Update 2:54pm- CSW spoke with [facility] with [facility]. Hazel to call back CSW shortly on questions/determination on referral for possible ALF placement.  CSW received message from Hydia with [facility] that patients referral being reviewed. CSW attempted to call Hydia with [facility] no answer. CSW awaiting callback with determination. CSW called Hazel with [facility] no answer and LVM. No current ALF offers for patient. CSW spoke with patient to update him. Patient informed CSW he is currently working on somewhere to stay. CSW to follow up this afternoon with patient on dc plan. CSW informed RN,CM, and MD.CSW will continue to follow.  Expected Discharge Plan: Homeless Shelter Barriers to Discharge: Continued Medical Work up (Homeless)  Expected Discharge Plan and Services Expected Discharge Plan: Homeless Shelter In-house Referral: Clinical Social Work Discharge Planning Services: CM Consult Post Acute Care Choice: NA Living arrangements for the past 2 months: No permanent address (trying to seek out boarding homes.)                                       Social Determinants of Health (SDOH) Interventions    Readmission Risk Interventions    10/10/2021    1:31 PM 10/21/2020   11:53 AM 08/31/2020     2:30 PM  Readmission Risk Prevention Plan  Transportation Screening Complete Complete   PCP or Specialist Appt within 3-5 Days     HRI or [place] Work Consult for [place] Planning/Counseling     Palliative Care Screening     Medication Review Press photographer) Complete Complete   PCP or Specialist appointment within 3-5 days of discharge Complete Complete   HRI or [place] Complete Complete   SW Recovery Care/Counseling Consult Complete Complete   Palliative Care Screening Not Applicable Not [place] Not Applicable Complete      Information is confidential and restricted. Go to Review Flowsheets to unlock data.

## 2021-10-12 NOTE — Progress Notes (Signed)
PROGRESS NOTE    Keith [name]  OFB:[id number] DOB: 14-Oct-1965 DOA: 10/08/2021 [name], NP    Brief Narrative:  Keith [name] is a 56 y.o. male with past medical history of HIV/ AIDS on antiretroviral therapy, hypertension, hyperlipidemia, IDDM2, stage IV CKD with baseline creatinine of 2.8-3.4,  anemia of chronic disease, BPH presented to hospital with elevated blood glucose level. And leg pain     In the ED, vitals were stable.  Blood glucose level was more than 600.  Sodium was 123 and corrected to 133.  Anion gap 11.  CBC showed normal WBC count. Patient received LR bolus 2.5 L and insulin drip and was admitted to hospital for hyperglycemia and AKI.   Assessment and plan.  Principal Problem:   Hyperglycemia Active Problems:   HTN (hypertension)   Hyperlipidemia   HIV disease (HCC)   Acute renal failure superimposed on stage 4 chronic kidney disease (HCC)   Anemia   BPH (benign prostatic hyperplasia)   GERD (gastroesophageal reflux disease)   Hyperglycemia, uncontrolled type 2 diabetes - Patient is stated that he did not know how to use sliding scale insulin, but reports use his long acting insulin -  Latest hemoglobin A1c of 6.0% on 08/17/2021.  - Received IV fluids and insulin drip during hospitalization, now back on subQ insulin -Diabetic coordinator has been consulted.   -continue adjust insulin, will discontinue metformin due to renal impairment     Acute Kidney Injury on CKD 4 vs progressive CKD/UTI/mild bilateral hydronephrosis (denies urinary difficulty but reports he purposely holding the urine):  Present with hyperglycemia, volume depletion, also found to have UTI   Ua with many bacteria, small leukocytes, + glucose, + protein, urine culture greater than 100,000 colonies gram-negative rods Hold lisinopril, gabapentin metformin. Discontinue oxybutinin Continue bicarb supplement  RN reports PVR 50cc  Cr get worse, will consult nephrology     Benign  Prostatic Hyperplasia: Continue finasteride. Does not take flomax ( h/o sulfa allergy)  HIV/AIDS: Most recent CD4 count noted to be 129 on 08/17/2021.  On Dovato and Doravirine as an outpatient, noting good compliance with these antiviral medications.  Also on prophylactic dapsone.  Continue antiretroviral treatment     Essential Hypertension:  On Coreg, Norvasc and lisinopril as outpatient.  Hold lisinopril due to AKI.  Continue amlodipine and Coreg.  Hyperlipidemia: Continue statin and Vascepa.    GERD: Continue Protonix    Left stump wound/ Leg pain.   Patient takes Percocet at home.   Reports ill fitting prothesis  Wound care consulted   MRSA colonization; decolinization  Homeless  Right BKA, left AKA, reports prosthesis does not fit well, has a walker and wheelchair in his car Reports can drive to him appointment Social worker consulted       DVT prophylaxis: heparin injection 5,000 Units Start: 10/09/21 1400   Code Status:     Code Status: Full Code  Disposition: ALF vs snf  Status is: Inpatient  Remains inpatient appropriate because: remain weak, homeless, awaiting for placement, awaiting for culture result , need cr to improve   Family Communication: Communicated with the patient at bedside  Consultants:  Wound care Diabetes RN nephrology  Procedures:  None  Antimicrobials:  Reocephin  Anti-infectives (From admission, onward)    Start     Dose/Rate Route Frequency Ordered Stop   10/12/21 0945  cefTRIAXone (ROCEPHIN) 1 g in sodium chloride 0.9 % 100 mL IVPB  1 g 200 mL/hr over 30 Minutes Intravenous Every 24 hours 10/12/21 0859     10/09/21 1000  dapsone tablet 100 mg        100 mg Oral Daily 10/09/21 0129     10/09/21 1000  dolutegravir-lamiVUDine (DOVATO) 50-300 MG per tablet 1 tablet        1 tablet Oral Daily 10/09/21 0129     10/09/21 1000  doravirine (PIFELTRO) tablet 100 mg        100 mg Oral Daily 10/09/21 0129         Subjective: Today, patient was seen and examined at bedside.    Creatinine got worse Urine culture positive greater than 100,000 gram-negative rods Continue to Complain of weakness, Complains of pain in the legs.    Objective: Vitals:   10/11/21 2129 10/12/21 0218 10/12/21 0408 10/12/21 0851  BP: (!) 123/99  (!) 139/97 137/83  Pulse: 69  82 70  Resp: 15 16 15    Temp: 97.7 F (36.5 C)  (!) 97.5 F (36.4 C)   TempSrc: Oral  Oral   SpO2: 96%  96%   Weight:  124.3 kg    Height:        Intake/Output Summary (Last 24 hours) at 10/12/2021 0901 Last data filed at 10/12/2021 0854 Gross per 24 hour  Intake 236 ml  Output 1200 ml  Net -964 ml   Filed Weights   10/10/21 0437 10/11/21 0500 10/12/21 0218  Weight: 123.8 kg 124.7 kg 124.3 kg    Physical Examination: Body mass index is 39.32 kg/m.  General: Obese built, not in obvious distress, on nasal cannula oxygen HENT:   No scleral pallor or icterus noted. Oral mucosa is moist.  Chest:  Clear breath sounds.  Diminished breath sounds bilaterally. No crackles or wheezes.  CVS: S1 &S2 heard. No murmur.  Regular rate and rhythm. Abdomen: Soft, nontender, nondistended.  Bowel sounds are heard.   Extremities: Bilateral below-knee amputation , left stump wound Psych: Alert, awake and oriented, normal mood CNS:  No cranial nerve deficits.  Moves extremities. Skin: Warm and dry.  No rashes noted.  Data Reviewed:   CBC: Recent Labs  Lab 10/08/21 1620 10/08/21 1743 10/09/21 0838 10/10/21 0434 10/11/21 0230  WBC 8.1  --  8.5 6.7 6.4  NEUTROABS  --   --  6.0  --  4.1  HGB 12.4* 13.6 12.2* 10.9* 11.2*  HCT 37.4* 40.0 36.4* 34.1* 35.5*  MCV 90.8  --  88.8 89.7 92.0  PLT 247  --  244 240 762    Basic Metabolic Panel: Recent Labs  Lab 10/09/21 0838 10/09/21 1440 10/09/21 1820 10/10/21 0434 10/11/21 0230 10/12/21 0316  NA 136 135 135 133* 134* 134*  K 4.0 3.9 4.7 4.3 4.8 4.6  CL 101 105 104 107 103 104  CO2 25 19* 20*  19* 18* 19*  GLUCOSE 176* 198* 328* 382* 265* 230*  BUN 41* 44* 46* 46* 45* 49*  CREATININE 3.34* 3.31* 3.39* 3.62* 3.65* 4.02*  CALCIUM 8.9 8.8* 8.6* 8.1* 8.8* 8.8*  MG 1.8  --   --  1.9  --   --     Liver Function Tests: Recent Labs  Lab 10/11/21 0230  AST 15  ALT 11  ALKPHOS 96  BILITOT 0.4  PROT 6.7  ALBUMIN 2.7*     Radiology Studies: No results found.    LOS: 4 days    Florencia Reasons, MD PhD FACP [facility] Available via Epic secure  chat 7am-7pm After these hours, please refer to coverage provider listed on [email or web address] 10/12/2021, 9:01 AM

## 2021-10-13 DIAGNOSIS — R739 Hyperglycemia, unspecified: Secondary | ICD-10-CM | POA: Diagnosis not present

## 2021-10-13 LAB — RENAL FUNCTION PANEL
Albumin: 2.7 g/dL — ABNORMAL LOW (ref 3.5–5.0)
Anion gap: 11 (ref 5–15)
BUN: 52 mg/dL — ABNORMAL HIGH (ref 6–20)
CO2: 20 mmol/L — ABNORMAL LOW (ref 22–32)
Calcium: 8.9 mg/dL (ref 8.9–10.3)
Chloride: 104 mmol/L (ref 98–111)
Creatinine, Ser: 4.44 mg/dL — ABNORMAL HIGH (ref 0.61–1.24)
GFR, Estimated: 15 mL/min — ABNORMAL LOW (ref >60)
Glucose, Bld: 247 mg/dL — ABNORMAL HIGH (ref 70–99)
Phosphorus: 4 mg/dL (ref 2.5–4.6)
Potassium: 4.8 mmol/L (ref 3.5–5.1)
Sodium: 135 mmol/L (ref 135–145)

## 2021-10-13 LAB — GLUCOSE, CAPILLARY
Glucose-Capillary: 252 mg/dL — ABNORMAL HIGH (ref 70–99)
Glucose-Capillary: 241 mg/dL — ABNORMAL HIGH (ref 70–99)
Glucose-Capillary: 134 mg/dL — ABNORMAL HIGH (ref 70–99)
Glucose-Capillary: 313 mg/dL — ABNORMAL HIGH (ref 70–99)

## 2021-10-13 LAB — MAGNESIUM: Magnesium: 2.1 mg/dL (ref 1.7–2.4)

## 2021-10-13 NOTE — Progress Notes (Signed)
OT Cancellation Note  Patient Details Name: Keith [name] MRN: [id number] DOB: Jan 26, 1966   Cancelled Treatment:    Reason Eval/Treat Not Completed: Other (comment).  Patient on the phone, stating he had to take the call, OT will try back as schedule allows.    Denzel Etienne D Keyshawn Hellwig 10/13/2021, 2:16 PM 10/13/2021  RP, OTR/L  Acute Rehabilitation Services  Office:  [phone]

## 2021-10-13 NOTE — Evaluation (Signed)
Physical Therapy Evaluation Patient Details Name: Keith [name] MRN: [id number] DOB: 23-Mar-1966 Today's Date: 10/13/2021  History of Present Illness  Pt is 56 yo male who is homeless, presents to [facility] on [facility]/08[facility]21 with hyperglycemia and volume contraction worsened by spending a lot of time outdoors. Has recent Klebsiella UTI.  PMHx:  R BKA, L AKA, AIDS, CVA, CAD, MI, HTN, CKD, DM2, PVD, CKD  Clinical Impression  Pt admitted with above diagnosis. Pt received in recliner. Due to wound on L residual limb did not allow him to don L prosthesis despite his desire to ambulate. Appreciate physician input on when his healing is sufficient for [place]. Pt had some difficulty donning RLE prosthesis but after numerous attempts was successful. Pt practiced transfers scooting without RW as well as SPT with RW and min guard A. Pt performed sit>stand with RW as well as standing LE there ex for LLE. Pt highly motivated and sometimes impulsive.  Pt currently with functional limitations due to the deficits listed below (see PT Problem List). Pt will benefit from skilled PT to increase their independence and safety with mobility to allow discharge to the venue listed below.          Recommendations for follow up therapy are one component of a multi-disciplinary discharge planning process, led by the attending physician.  Recommendations may be updated based on patient status, additional functional criteria and insurance authorization.  Follow Up Recommendations Other (comment) (TBD based on where he can d/c)      Assistance Recommended at Discharge Intermittent Supervision/Assistance  Patient can return home with the following  A little help with walking and/or transfers;A little help with bathing/dressing/bathroom;Assist for transportation;Help with stairs or ramp for entrance;Assistance with cooking/housework    Equipment Recommendations None recommended by PT  Recommendations for Other Services       Functional  Status Assessment Patient has had a recent decline in their functional status and demonstrates the ability to make significant improvements in function in a reasonable and predictable amount of time.     Precautions / Restrictions Precautions Precautions: Fall Precaution Comments: R BKA, L AKA Restrictions Weight Bearing Restrictions: No      Mobility  Bed Mobility Overal bed mobility: Modified Independent                  Transfers Overall transfer level: Needs assistance Equipment used: Rolling walker (2 wheels) Transfers: Bed to chair/wheelchair/BSC, Sit to/from Stand Sit to Stand: Supervision   Step pivot transfers: Min guard      Lateral/Scoot Transfers: Supervision General transfer comment: pt able to scoot from recliner to bed with use of UE's without physical assist and he displayed safety. Pt step pivot bed>recliner with RW and min guard for safety with decreased control on stand > sit    Ambulation/Gait               General Gait Details: did not don L prosthesis for ambulation  Stairs            Wheelchair Mobility    Modified Rankin (Stroke Patients Only)       Balance Overall balance assessment: Mild deficits observed, not formally tested                                           Pertinent Vitals/Pain Pain Assessment Pain Assessment: No/denies pain    Home Living  Family/patient expects to be discharged to:: Unsure Living Arrangements: Alone                 Additional Comments: homeless    Prior Function Prior Level of Function : Independent/Modified Independent             Mobility Comments: Uses rollator and bilateral prosthetic for short distance ambulation. Uses WC for further mobility ADLs Comments: limited by living situation     Hand Dominance   Dominant Hand: Right    Extremity/Trunk Assessment   Upper Extremity Assessment Upper Extremity Assessment: Overall WFL for tasks  assessed    Lower Extremity Assessment Lower Extremity Assessment: RLE deficits/detail RLE Deficits / Details: R BKA, strength WFL. Pt had difficult time donning RLE prosthesis but after numerous attempts was successful. He reports he sometimes had difficulty PTA RLE Sensation: history of peripheral neuropathy RLE Coordination: decreased gross motor LLE Deficits / Details: Has L prosthesis but has open wound L residual limb so did not allow him to don L prosthesis today LLE Sensation: history of peripheral neuropathy LLE Coordination: decreased gross motor    Cervical / Trunk Assessment Cervical / Trunk Assessment: Normal  Communication   Communication: No difficulties  Cognition Arousal/Alertness: Awake/alert Behavior During Therapy: Impulsive Overall Cognitive Status: No family/caregiver present to determine baseline cognitive functioning Area of Impairment: Safety/judgement                         Safety/Judgement: Decreased awareness of deficits     General Comments: very motivated but does not want to attend to certain things like the wound on his LLE which he needs to get healed before he puts his prosthesis on (he just keeps saying, "I want to get my balance right 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")        General Comments General comments (skin integrity, edema, etc.): VSS    Exercises Amputee Exercises Hip Extension: AROM, Left, 10 reps, Standing Hip Flexion/Marching: AROM, Left, 10 reps, Standing Other Exercises Other Exercises: sit<>stand 10x   Assessment/Plan    PT Assessment Patient needs continued PT services  PT Problem List Decreased activity tolerance;Decreased mobility;Decreased skin integrity;Decreased safety awareness       PT Treatment Interventions DME instruction;Gait training;Functional mobility training;Therapeutic activities;Therapeutic exercise;Balance training;Patient/family education    PT Goals (Current goals can be found  in the Care Plan section)  Acute Rehab PT Goals Patient Stated Goal: walk and work PT Goal Formulation: With patient Time For Goal Achievement: 10/27/21 Potential to Achieve Goals: Good    Frequency Min 3X/week     Co-evaluation               AM-PAC PT "6 Clicks" Mobility  Outcome Measure Help needed turning from your back to your side while in a flat bed without using bedrails?: None Help needed moving from lying on your back to sitting on the side of a flat bed without using bedrails?: None Help needed moving to and from a bed to a chair (including a wheelchair)?: A Little Help needed standing up from a chair using your arms (e.g., wheelchair or bedside chair)?: A Little Help needed to walk in hospital room?: Total Help needed climbing 3-5 steps with a railing? : Total 6 Click Score: 16    End of Session   Activity Tolerance: Patient tolerated treatment well Patient left: in chair;with call bell/phone within reach Nurse Communication: Mobility status PT Visit Diagnosis:  Difficulty in walking, not elsewhere classified (R26.2)    Time: 9093-1121 PT Time Calculation (min) (ACUTE ONLY): 33 min   Charges:   PT Evaluation $PT Eval Moderate Complexity: 1 Mod PT Treatments $Therapeutic Exercise: 8-22 mins        Leighton Roach, PT  Acute Rehab Services Secure chat preferred Office [place] 10/13/2021, 4:13 PM

## 2021-10-13 NOTE — TOC Progression Note (Signed)
Transition of Care [facility]) - Progression Note    Patient Details  Name: Keith [name] MRN: [id number] Date of Birth: [birth year]  [facility] of [facility]) CM/SW Contact  Joanne Chars, LCSW Phone Number: 10/13/2021, 2:06 PM  Clinical Narrative:  CSW LM for [place] at [facility] regarding potential admission.  CSW unable to leave message for Hydia at [employer].     Expected Discharge Plan: Homeless Shelter Barriers to Discharge: Continued Medical Work up (Homeless)  Expected Discharge Plan and Services Expected Discharge Plan: Homeless Shelter In-house Referral: Clinical Social Work Discharge Planning Services: CM Consult Post Acute Care Choice: NA Living arrangements for the past 2 months: No permanent address (trying to seek out boarding homes.)                                       Social Determinants of Health (SDOH) Interventions    Readmission Risk Interventions    10/10/2021    1:31 PM 10/21/2020   11:53 AM 08/31/2020    2:30 PM  Readmission Risk Prevention Plan  Transportation Screening Complete Complete   PCP or Specialist Appt within 3-5 Days     HRI or [place] Work Consult for [place] Planning/Counseling     Palliative Care Screening     Medication Review [employer] photographer) Complete Complete   PCP or Specialist appointment within 3-5 days of discharge Complete Complete   HRI or [place] Complete Complete   SW Recovery Care/Counseling Consult Complete Complete   Palliative Care Screening Not Applicable Not [place] Not Applicable Complete      Information is confidential and restricted. Go to Review Flowsheets to unlock data.

## 2021-10-13 NOTE — Progress Notes (Signed)
[name], male   DOB: [birth year], 56 y.o.   MRN: [id number] [facility] Progress Note   Assessment/ Plan:   1.  Acute kidney injury on chronic kidney disease stage IV: Appears to have been hemodynamically mediated in the setting of hyperglycemia and increased insensible losses while outdoors with the increased ambient temperatures.  Renal function slightly worse at this time suggesting that he may have had some mild evolution to ischemic ATN and is here to establish a plateau.  No additional indications for intravenous fluids at this time and we will continue to monitor labs and urine output (encouraged him to urinate as soon as he has the urge rather than urine holding given renal ultrasound showing mild hydronephrosis. 2.  Klebsiella urinary tract infection: No markers of sepsis noted, ongoing treatment with intravenous ceftriaxone as sensitivities pending. 3.  Hyperglycemia: With background history of poorly controlled type 2 diabetes mellitus and likely exacerbated by recent UTI.  Glycemic management per primary service. 4.  Anion gap metabolic acidosis: This appears to be largely from his acute kidney injury, continue sodium bicarbonate and treat hyperglycemia with efforts at improving renal function with fluids. 5.  Hyponatremia: This appears to be largely pseudohyponatremia in the setting of hyperglycemia, monitor sodium with glycemic control and intravenous fluids.  Subjective:   Reports to be feeling well, denies any chest pain or shortness of breath   Objective:   BP (!) 144/88 (BP Location: Left Arm)   Pulse 73   Temp 97.9 F (36.6 C) (Oral)   Resp 15   Ht 5\' 10"  (1.778 m)   Wt 125.8 kg   SpO2 97%   BMI 39.79 kg/m   Intake/Output Summary (Last 24 hours) at 10/13/2021 1007 Last data filed at 10/13/2021 0300 Gross per 24 hour  Intake 854.97 ml  Output 1050 ml  Net -195.03 ml   Weight change: 1.5 kg  Physical Exam: Gen: Comfortably sitting up in  recliner CVS: Pulse regular rhythm, normal rate, S1 and S2 normal Resp: Clear to auscultation bilaterally, no rales/rhonchi Abd: Soft, obese, nontender, bowel sounds normal Ext: Status post right BKA, left above-knee amputation with intact dressing over stump site  Imaging: US RENAL  Result Date: 10/12/2021 CLINICAL DATA:  AKI EXAM: RENAL / URINARY TRACT ULTRASOUND COMPLETE COMPARISON:  Renal ultrasound dated Aug 22, 2021 FINDINGS: Right Kidney: Renal measurements: 11.3 x 5.1 x 6.6 cm = volume: 138 mL. Increased parenchymal echogenicity no mass. Mild hydronephrosis. Left Kidney: Renal measurements: 11.0 x 5.2 x 4.8 cm = volume: 144 mL. Increased parenchymal echogenicity no mass. Mild hydronephrosis. Bladder: Appears normal for degree of bladder distention. Bilateral ureter jets visualized. Other: Prostatomegaly, measuring up to 4.6 cm IMPRESSION: 1. Mild bilateral hydronephrosis. 2. Bilateral increased parenchymal echogenicity, findings can be seen in the setting of medical renal disease. 3. Prostatomegaly. Electronically Signed   By: Yetta Glassman M.D.   On: 10/12/2021 12:13    Labs: BMET Recent Labs  Lab 10/09/21 0838 10/09/21 1440 10/09/21 1820 10/10/21 0434 10/11/21 0230 10/12/21 0316 10/13/21 0236  NA 136 135 135 133* 134* 134* 135  K 4.0 3.9 4.7 4.3 4.8 4.6 4.8  CL 101 105 104 107 103 104 104  CO2 25 19* 20* 19* 18* 19* 20*  GLUCOSE 176* 198* 328* 382* 265* 230* 247*  BUN 41* 44* 46* 46* 45* 49* 52*  CREATININE 3.34* 3.31* 3.39* 3.62* 3.65* 4.02* 4.44*  CALCIUM 8.9 8.8* 8.6* 8.1* 8.8* 8.8* 8.9  PHOS  --   --   --   --   --   --  4.0   CBC Recent Labs  Lab 10/08/21 1620 10/08/21 1743 10/09/21 0838 10/10/21 0434 10/11/21 0230  WBC 8.1  --  8.5 6.7 6.4  NEUTROABS  --   --  6.0  --  4.1  HGB 12.4* 13.6 12.2* 10.9* 11.2*  HCT 37.4* 40.0 36.4* 34.1* 35.5*  MCV 90.8  --  88.8 89.7 92.0  PLT 247  --  244 240 257    Medications:     amLODipine  10 mg Oral Daily    atorvastatin  80 mg Oral Daily   carvedilol  3.125 mg Oral BID WC   Chlorhexidine Gluconate Cloth  6 each Topical Q0600   dapsone  100 mg Oral Daily   dolutegravir-lamiVUDine  1 tablet Oral Daily   doravirine  100 mg Oral Daily   ferrous sulfate  325 mg Oral Q M,W,F-1800   finasteride  5 mg Oral Daily   gabapentin  100 mg Oral TID   heparin injection (subcutaneous)  5,000 Units Subcutaneous Q8H   icosapent Ethyl  2 g Oral BID   insulin aspart  0-5 Units Subcutaneous QHS   insulin aspart  0-9 Units Subcutaneous TID WC   insulin aspart  3 Units Subcutaneous TID WC   insulin glargine-yfgn  12 Units Subcutaneous Daily   mupirocin cream   Topical Daily   mupirocin ointment  1 Application Nasal BID   pantoprazole  40 mg Oral Daily   senna-docusate  1 tablet Oral QHS   sodium bicarbonate  650 mg Oral BID   Elmarie Shiley, MD 10/13/2021, 10:07 AM

## 2021-10-13 NOTE — Progress Notes (Signed)
PROGRESS NOTE    Keith [name]  IRW:[id number] DOB: 1965-05-10 DOA: 10/08/2021 [name], NP    Brief Narrative:  Keith [name] is a 56 y.o. male with past medical history of HIV/ AIDS on antiretroviral therapy, hypertension, hyperlipidemia, IDDM2, stage IV CKD with baseline creatinine of 2.8-3.4,  anemia of chronic disease, BPH presented to hospital with elevated blood glucose level. And leg pain     In the ED, vitals were stable.  Blood glucose level was more than 600.  Sodium was 123 and corrected to 133.  Anion gap 11.  CBC showed normal WBC count. Patient received LR bolus 2.5 L and insulin drip and was admitted to hospital for hyperglycemia and AKI.   Assessment and plan.  Principal Problem:   Hyperglycemia Active Problems:   HTN (hypertension)   Hyperlipidemia   HIV disease (HCC)   Acute renal failure superimposed on stage 4 chronic kidney disease (HCC)   Anemia   BPH (benign prostatic hyperplasia)   GERD (gastroesophageal reflux disease)   Hyperglycemia, uncontrolled type 2 diabetes - Patient is stated that he did not know how to use sliding scale insulin, but reports use his long acting insulin -  Latest hemoglobin A1c of 6.0% on 08/17/2021.  - Received IV fluids and insulin drip during hospitalization, now back on subQ insulin -Diabetic coordinator has been consulted.   -continue adjust insulin, will discontinue metformin due to renal impairment     Acute Kidney Injury on CKD 4 vs progressive CKD/UTI/mild bilateral hydronephrosis (denies urinary difficulty but reports he purposely holding the urine):  Present with hyperglycemia, volume depletion, also found to have UTI    urine culture + Klebsiella pneumonia Hold lisinopril, gabapentin metformin. Discontinue oxybutinin Continue bicarb supplement  RN reports PVR 50cc  Cr get worse,  nephrology consulted, will follow recommendation      Benign Prostatic Hyperplasia: Continue finasteride. Does not take  flomax ( h/o sulfa allergy)  HIV/AIDS: Most recent CD4 count noted to be 129 on 08/17/2021.  On Dovato and Doravirine as an outpatient, noting good compliance with these antiviral medications.  Also on prophylactic dapsone.  Continue antiretroviral treatment     Essential Hypertension:  On Coreg, Norvasc and lisinopril as outpatient.  Hold lisinopril due to AKI.  Continue amlodipine and Coreg.  Hyperlipidemia: Continue statin and Vascepa.    GERD: Continue Protonix    Left stump wound/ Leg pain.   Patient takes Percocet at home.   Reports ill fitting prothesis  Wound care consulted   MRSA colonization; decolinization  Homeless  Right BKA, left AKA, reports prosthesis does not fit well, has a walker and wheelchair in his car Reports can drive to him appointment Social worker consulted       DVT prophylaxis: heparin injection 5,000 Units Start: 10/09/21 1400   Code Status:     Code Status: Full Code  Disposition: ALF vs snf  Status is: Inpatient  Remains inpatient appropriate because: awaiting for culture result , need cr to improve, homeless,     Family Communication: Communicated with the patient at bedside  Consultants:  Wound care Diabetes RN nephrology  Procedures:  None  Antimicrobials:  Reocephin  Anti-infectives (From admission, onward)    Start     Dose/Rate Route Frequency Ordered Stop   10/12/21 0945  cefTRIAXone (ROCEPHIN) 1 g in sodium chloride 0.9 % 100 mL IVPB        1 g 200 mL/hr over 30 Minutes Intravenous Every 24 hours  10/12/21 0859     10/09/21 1000  dapsone tablet 100 mg        100 mg Oral Daily 10/09/21 0129     10/09/21 1000  dolutegravir-lamiVUDine (DOVATO) 50-300 MG per tablet 1 tablet        1 tablet Oral Daily 10/09/21 0129     10/09/21 1000  doravirine (PIFELTRO) tablet 100 mg        100 mg Oral Daily 10/09/21 0129        Subjective: Today, patient was seen and examined at bedside.    He is sitting up in chair, reports  feeling better, continue have phantom leg pain No fever Creatinine continue to trend up  Objective: Vitals:   10/12/21 2100 10/13/21 0548 10/13/21 0804 10/13/21 1803  BP: (!) 122/94 98/66 (!) 144/88 130/81  Pulse: 77 64 73 73  Resp: 18 14 15 16   Temp: 98.3 F (36.8 C) 97.9 F (36.6 C) 97.9 F (36.6 C)   TempSrc: Oral Oral Oral   SpO2: 95% 96% 97%   Weight:  125.8 kg    Height:        Intake/Output Summary (Last 24 hours) at 10/13/2021 1910 Last data filed at 10/13/2021 0300 Gross per 24 hour  Intake 626.05 ml  Output 550 ml  Net 76.05 ml   Filed Weights   10/11/21 0500 10/12/21 0218 10/13/21 0548  Weight: 124.7 kg 124.3 kg 125.8 kg    Physical Examination: Body mass index is 39.79 kg/m.  General: Obese built, not in obvious distress, on nasal cannula oxygen HENT:   No scleral pallor or icterus noted. Oral mucosa is moist.  Chest:  Clear breath sounds.  Diminished breath sounds bilaterally. No crackles or wheezes.  CVS: S1 &S2 heard. No murmur.  Regular rate and rhythm. Abdomen: Soft, nontender, nondistended.  Bowel sounds are heard.   Extremities: Bilateral below-knee amputation , left stump wound Psych: Alert, awake and oriented, normal mood CNS:  No cranial nerve deficits.  Moves extremities. Skin: Warm and dry.  No rashes noted.  Data Reviewed:   CBC: Recent Labs  Lab 10/08/21 1620 10/08/21 1743 10/09/21 0838 10/10/21 0434 10/11/21 0230  WBC 8.1  --  8.5 6.7 6.4  NEUTROABS  --   --  6.0  --  4.1  HGB 12.4* 13.6 12.2* 10.9* 11.2*  HCT 37.4* 40.0 36.4* 34.1* 35.5*  MCV 90.8  --  88.8 89.7 92.0  PLT 247  --  244 240 619    Basic Metabolic Panel: Recent Labs  Lab 10/09/21 0838 10/09/21 1440 10/09/21 1820 10/10/21 0434 10/11/21 0230 10/12/21 0316 10/13/21 0236  NA 136   < > 135 133* 134* 134* 135  K 4.0   < > 4.7 4.3 4.8 4.6 4.8  CL 101   < > 104 107 103 104 104  CO2 25   < > 20* 19* 18* 19* 20*  GLUCOSE 176*   < > 328* 382* 265* 230* 247*  BUN  41*   < > 46* 46* 45* 49* 52*  CREATININE 3.34*   < > 3.39* 3.62* 3.65* 4.02* 4.44*  CALCIUM 8.9   < > 8.6* 8.1* 8.8* 8.8* 8.9  MG 1.8  --   --  1.9  --   --  2.1  PHOS  --   --   --   --   --   --  4.0   < > = values in this interval not displayed.    Liver  Function Tests: Recent Labs  Lab 10/11/21 0230 10/13/21 0236  AST 15  --   ALT 11  --   ALKPHOS 96  --   BILITOT 0.4  --   PROT 6.7  --   ALBUMIN 2.7* 2.7*     Radiology Studies: US RENAL  Result Date: 10/12/2021 CLINICAL DATA:  AKI EXAM: RENAL / URINARY TRACT ULTRASOUND COMPLETE COMPARISON:  Renal ultrasound dated Aug 22, 2021 FINDINGS: Right Kidney: Renal measurements: 11.3 x 5.1 x 6.6 cm = volume: 138 mL. Increased parenchymal echogenicity no mass. Mild hydronephrosis. Left Kidney: Renal measurements: 11.0 x 5.2 x 4.8 cm = volume: 144 mL. Increased parenchymal echogenicity no mass. Mild hydronephrosis. Bladder: Appears normal for degree of bladder distention. Bilateral ureter jets visualized. Other: Prostatomegaly, measuring up to 4.6 cm IMPRESSION: 1. Mild bilateral hydronephrosis. 2. Bilateral increased parenchymal echogenicity, findings can be seen in the setting of medical renal disease. 3. Prostatomegaly. Electronically Signed   By: Yetta Glassman M.D.   On: 10/12/2021 12:13      LOS: 5 days    Florencia Reasons, MD PhD FACP [facility] Available via Epic secure chat 7am-7pm After these hours, please refer to coverage provider listed on [email or web address] 10/13/2021, 7:10 PM

## 2021-10-14 DIAGNOSIS — R739 Hyperglycemia, unspecified: Secondary | ICD-10-CM | POA: Diagnosis not present

## 2021-10-14 LAB — RENAL FUNCTION PANEL
Albumin: 2.9 g/dL — ABNORMAL LOW (ref 3.5–5.0)
Anion gap: 10 (ref 5–15)
BUN: 59 mg/dL — ABNORMAL HIGH (ref 6–20)
CO2: 20 mmol/L — ABNORMAL LOW (ref 22–32)
Calcium: 9 mg/dL (ref 8.9–10.3)
Chloride: 102 mmol/L (ref 98–111)
Creatinine, Ser: 4.56 mg/dL — ABNORMAL HIGH (ref 0.61–1.24)
GFR, Estimated: 14 mL/min — ABNORMAL LOW (ref >60)
Glucose, Bld: 293 mg/dL — ABNORMAL HIGH (ref 70–99)
Phosphorus: 4.1 mg/dL (ref 2.5–4.6)
Potassium: 5 mmol/L (ref 3.5–5.1)
Sodium: 132 mmol/L — ABNORMAL LOW (ref 135–145)

## 2021-10-14 LAB — GLUCOSE, CAPILLARY
Glucose-Capillary: 229 mg/dL — ABNORMAL HIGH (ref 70–99)
Glucose-Capillary: 230 mg/dL — ABNORMAL HIGH (ref 70–99)
Glucose-Capillary: 148 mg/dL — ABNORMAL HIGH (ref 70–99)
Glucose-Capillary: 172 mg/dL — ABNORMAL HIGH (ref 70–99)

## 2021-10-14 NOTE — Progress Notes (Signed)
[name], male   DOB: 1965-06-25, 56 y.o.   MRN: [id number]  [facility] Progress Note   Assessment/ Plan:   1.  Acute kidney injury on chronic kidney disease stage IV: Appears to have been hemodynamically mediated in the setting of hyperglycemia and increased insensible losses while outdoors with the increased ambient temperatures.  Suspect that he likely developed mild ischemic ATN from sustained prerenal state and with minimal rise of creatinine overnight indicating that he might be approaching plateau phase.  He remains nonoliguric and does not have any acute electrolyte abnormalities to prompt intervention. 2.  Klebsiella urinary tract infection: No markers of sepsis noted, ongoing treatment with intravenous ceftriaxone. 3.  Hyperglycemia: With background history of poorly controlled type 2 diabetes mellitus and likely exacerbated by recent UTI.  Glycemic management per primary service. 4.  Anion gap metabolic acidosis: This appears to be largely from his acute kidney injury, continue sodium bicarbonate and treat hyperglycemia with efforts at improving renal function with fluids. 5.  Hyponatremia: This appears to be largely pseudohyponatremia in the setting of hyperglycemia as well as impaired free water excretion in the setting of acute kidney injury.  Continue to monitor with ongoing renal recovery/urine output.  Subjective:   Denies any acute events overnight, trying to catch up with sleep after trouble falling asleep last night.   Objective:   BP 125/82 (BP Location: Right Arm)   Pulse 75   Temp 98 F (36.7 C) (Oral)   Resp 18   Ht 5\' 10"  (1.778 m)   Wt 125 kg   SpO2 92%   BMI 39.54 kg/m   Intake/Output Summary (Last 24 hours) at 10/14/2021 0959 Last data filed at 10/14/2021 0901 Gross per 24 hour  Intake 720 ml  Output 1150 ml  Net -430 ml   Weight change: -0.8 kg  Physical Exam: Gen: Comfortably in bed, awakens and engages to conversation  easily CVS: Pulse regular rhythm, normal rate, S1 and S2 normal Resp: Clear to auscultation bilaterally, no rales/rhonchi Abd: Soft, obese, nontender, bowel sounds normal Ext: Status post right BKA, left above-knee amputation with intact dressing over stump site  Imaging: US RENAL  Result Date: 10/12/2021 CLINICAL DATA:  AKI EXAM: RENAL / URINARY TRACT ULTRASOUND COMPLETE COMPARISON:  Renal ultrasound dated Aug 22, 2021 FINDINGS: Right Kidney: Renal measurements: 11.3 x 5.1 x 6.6 cm = volume: 138 mL. Increased parenchymal echogenicity no mass. Mild hydronephrosis. Left Kidney: Renal measurements: 11.0 x 5.2 x 4.8 cm = volume: 144 mL. Increased parenchymal echogenicity no mass. Mild hydronephrosis. Bladder: Appears normal for degree of bladder distention. Bilateral ureter jets visualized. Other: Prostatomegaly, measuring up to 4.6 cm IMPRESSION: 1. Mild bilateral hydronephrosis. 2. Bilateral increased parenchymal echogenicity, findings can be seen in the setting of medical renal disease. 3. Prostatomegaly. Electronically Signed   By: Yetta Glassman M.D.   On: 10/12/2021 12:13    Labs: BMET Recent Labs  Lab 10/09/21 1440 10/09/21 1820 10/10/21 0434 10/11/21 0230 10/12/21 0316 10/13/21 0236 10/14/21 0117  NA 135 135 133* 134* 134* 135 132*  K 3.9 4.7 4.3 4.8 4.6 4.8 5.0  CL 105 104 107 103 104 104 102  CO2 19* 20* 19* 18* 19* 20* 20*  GLUCOSE 198* 328* 382* 265* 230* 247* 293*  BUN 44* 46* 46* 45* 49* 52* 59*  CREATININE 3.31* 3.39* 3.62* 3.65* 4.02* 4.44* 4.56*  CALCIUM 8.8* 8.6* 8.1* 8.8* 8.8* 8.9 9.0  PHOS  --   --   --   --   --  4.0 4.1   CBC Recent Labs  Lab 10/08/21 1620 10/08/21 1743 10/09/21 0838 10/10/21 0434 10/11/21 0230  WBC 8.1  --  8.5 6.7 6.4  NEUTROABS  --   --  6.0  --  4.1  HGB 12.4* 13.6 12.2* 10.9* 11.2*  HCT 37.4* 40.0 36.4* 34.1* 35.5*  MCV 90.8  --  88.8 89.7 92.0  PLT 247  --  244 240 257    Medications:     amLODipine  10 mg Oral Daily    atorvastatin  80 mg Oral Daily   carvedilol  3.125 mg Oral BID WC   dapsone  100 mg Oral Daily   dolutegravir-lamiVUDine  1 tablet Oral Daily   doravirine  100 mg Oral Daily   ferrous sulfate  325 mg Oral Q M,W,F-1800   finasteride  5 mg Oral Daily   gabapentin  100 mg Oral TID   heparin injection (subcutaneous)  5,000 Units Subcutaneous Q8H   icosapent Ethyl  2 g Oral BID   insulin aspart  0-5 Units Subcutaneous QHS   insulin aspart  0-9 Units Subcutaneous TID WC   insulin aspart  3 Units Subcutaneous TID WC   insulin glargine-yfgn  12 Units Subcutaneous Daily   mupirocin cream   Topical Daily   pantoprazole  40 mg Oral Daily   senna-docusate  1 tablet Oral QHS   sodium bicarbonate  650 mg Oral BID   Elmarie Shiley, MD 10/14/2021, 9:59 AM

## 2021-10-14 NOTE — Progress Notes (Signed)
OT Cancellation Note  Patient Details Name: Keith [name] MRN: [id number] DOB: 1966-04-08   Cancelled Treatment:    Reason Eval/Treat Not Completed: Fatigue/lethargy limiting ability to participate.  Pt citing fatigue, despite max encouragement, pt deferred OT at this time.  Will reattempt.  Nilsa Nutting., OTR/L Acute Rehabilitation Services Pager [phone] Office [phone]   Walker Kehr 10/14/2021, 9:02 AM

## 2021-10-14 NOTE — Plan of Care (Signed)
  Problem: Health Behavior/Discharge Planning: Goal: Ability to manage health-related needs will improve Outcome: Progressing   

## 2021-10-14 NOTE — Progress Notes (Addendum)
PROGRESS NOTE    Keith [name]  POE:[id number] DOB: [birth year] DOA: 10/08/2021 [name], NP    Brief Narrative:  Keith [name] is a 56 y.o. male with past medical history of HIV/ AIDS on antiretroviral therapy, hypertension, hyperlipidemia, IDDM2, stage IV CKD with baseline creatinine of 2.8-3.4,  anemia of chronic disease, BPH presented to hospital with elevated blood glucose level. And leg pain        Assessment and plan.  Principal Problem:   Hyperglycemia Active Problems:   HTN (hypertension)   Hyperlipidemia   HIV disease (HCC)   Acute renal failure superimposed on stage 4 chronic kidney disease (HCC)   Anemia   BPH (benign prostatic hyperplasia)   GERD (gastroesophageal reflux disease)   Hyperglycemia, uncontrolled type 2 diabetes - Patient is stated that he did not know how to use sliding scale insulin, but reports use his long acting insulin -  Latest hemoglobin A1c of 6.0% on 08/17/2021.  - Received IV fluids and insulin drip during hospitalization, now back on subQ insulin -Diabetic coordinator has been consulted.   -continue adjust insulin, discontinue metformin due to renal impairment   Acute Kidney Injury on CKD 4 vs progressive CKD/UTI/mild bilateral hydronephrosis (denies urinary difficulty but reports he purposely holding the urine):  Present with hyperglycemia, volume depletion, also found to have UTI    urine culture + Klebsiella pneumonia Hold lisinopril ,metformin. Renal dosing neurontin  Discontinue oxybutinin Continue bicarb supplement  RN reports PVR 50cc  Cr get worse,  nephrology consulted, will follow recommendation      Benign Prostatic Hyperplasia: Continue finasteride. Does not take flomax ( h/o sulfa allergy)  HIV/AIDS: Most recent CD4 count noted to be 129 on 08/17/2021.  On Dovato and Doravirine as an outpatient, noting good compliance with these antiviral medications.  Also on prophylactic dapsone.  Continue antiretroviral  treatment     Essential Hypertension:  On Coreg, Norvasc and lisinopril as outpatient.   Hold lisinopril due to AKI.   Hold norvasc, Bp stable on Coreg.  Hyperlipidemia: Continue statin and Vascepa.    GERD: Continue Protonix    Left stump wound/ Leg pain.   Patient takes Percocet at home.   Reports ill fitting prothesis  Wound care consulted   MRSA colonization; decolinization  Homeless  Bilateral amputee, with ill fitting prosthesis , stump wound,  Reports has a walker and wheelchair in his car Social worker consulted       DVT prophylaxis: heparin injection 5,000 Units Start: 10/09/21 1400   Code Status:     Code Status: Full Code  Disposition: ALF vs snf  Status is: Inpatient  Remains inpatient appropriate because: awaiting for culture result , need cr to improve, homeless,     Family Communication: Communicated with the patient at bedside  Consultants:  Wound care Diabetes RN nephrology  Procedures:  None  Antimicrobials:  Reocephin  Anti-infectives (From admission, onward)    Start     Dose/Rate Route Frequency Ordered Stop   10/12/21 0945  cefTRIAXone (ROCEPHIN) 1 g in sodium chloride 0.9 % 100 mL IVPB        1 g 200 mL/hr over 30 Minutes Intravenous Every 24 hours 10/12/21 0859     10/09/21 1000  dapsone tablet 100 mg        100 mg Oral Daily 10/09/21 0129     10/09/21 1000  dolutegravir-lamiVUDine (DOVATO) 50-300 MG per tablet 1 tablet        1 tablet  Oral Daily 10/09/21 0129     10/09/21 1000  doravirine (PIFELTRO) tablet 100 mg        100 mg Oral Daily 10/09/21 0129        Subjective:  Cr continue to uptrend   continue have phantom leg pain No fever  Objective: Vitals:   10/14/21 0500 10/14/21 0613 10/14/21 0800 10/14/21 1305  BP:  125/82  (!) 86/46  Pulse:  75    Resp:  18 18 16   Temp:  98 F (36.7 C)  98.2 F (36.8 C)  TempSrc:  Oral  Oral  SpO2:  92%    Weight: 125 kg     Height:        Intake/Output Summary (Last  24 hours) at 10/14/2021 1324 Last data filed at 10/14/2021 0901 Gross per 24 hour  Intake 720 ml  Output 1150 ml  Net -430 ml   Filed Weights   10/12/21 0218 10/13/21 0548 10/14/21 0500  Weight: 124.3 kg 125.8 kg 125 kg    Physical Examination: Body mass index is 39.54 kg/m.  General: Obese built, not in obvious distress, on nasal cannula oxygen HENT:   No scleral pallor or icterus noted. Oral mucosa is moist.  Chest:  Clear breath sounds.  Diminished breath sounds bilaterally. No crackles or wheezes.  CVS: S1 &S2 heard. No murmur.  Regular rate and rhythm. Abdomen: Soft, nontender, nondistended.  Bowel sounds are heard.   Extremities: Bilateral below-knee amputation , left stump wound Psych: Alert, awake and oriented, normal mood CNS:  No cranial nerve deficits.  Moves extremities. Skin: Warm and dry.  No rashes noted.  Data Reviewed:   CBC: Recent Labs  Lab 10/08/21 1620 10/08/21 1743 10/09/21 0838 10/10/21 0434 10/11/21 0230  WBC 8.1  --  8.5 6.7 6.4  NEUTROABS  --   --  6.0  --  4.1  HGB 12.4* 13.6 12.2* 10.9* 11.2*  HCT 37.4* 40.0 36.4* 34.1* 35.5*  MCV 90.8  --  88.8 89.7 92.0  PLT 247  --  244 240 735    Basic Metabolic Panel: Recent Labs  Lab 10/09/21 0838 10/09/21 1440 10/10/21 0434 10/11/21 0230 10/12/21 0316 10/13/21 0236 10/14/21 0117  NA 136   < > 133* 134* 134* 135 132*  K 4.0   < > 4.3 4.8 4.6 4.8 5.0  CL 101   < > 107 103 104 104 102  CO2 25   < > 19* 18* 19* 20* 20*  GLUCOSE 176*   < > 382* 265* 230* 247* 293*  BUN 41*   < > 46* 45* 49* 52* 59*  CREATININE 3.34*   < > 3.62* 3.65* 4.02* 4.44* 4.56*  CALCIUM 8.9   < > 8.1* 8.8* 8.8* 8.9 9.0  MG 1.8  --  1.9  --   --  2.1  --   PHOS  --   --   --   --   --  4.0 4.1   < > = values in this interval not displayed.    Liver Function Tests: Recent Labs  Lab 10/11/21 0230 10/13/21 0236 10/14/21 0117  AST 15  --   --   ALT 11  --   --   ALKPHOS 96  --   --   BILITOT 0.4  --   --   PROT  6.7  --   --   ALBUMIN 2.7* 2.7* 2.9*     Radiology Studies: No results found.  LOS: 6 days    Florencia Reasons, MD PhD FACP [facility] Available via Epic secure chat 7am-7pm After these hours, please refer to coverage provider listed on [email or web address] 10/14/2021, 1:24 PM

## 2021-10-15 DIAGNOSIS — R739 Hyperglycemia, unspecified: Secondary | ICD-10-CM | POA: Diagnosis not present

## 2021-10-15 LAB — CBC WITH DIFFERENTIAL/PLATELET
Abs Immature Granulocytes: 0.07 10*3/uL (ref 0.00–0.07)
Basophils Absolute: 0 10*3/uL (ref 0.0–0.1)
Basophils Relative: 0 %
Eosinophils Absolute: 0.1 10*3/uL (ref 0.0–0.5)
Eosinophils Relative: 1 %
HCT: 32.7 % — ABNORMAL LOW (ref 39.0–52.0)
Hemoglobin: 10.4 g/dL — ABNORMAL LOW (ref 13.0–17.0)
Immature Granulocytes: 1 %
Lymphocytes Relative: 18 %
Lymphs Abs: 1.8 10*3/uL (ref 0.7–4.0)
MCH: 28.8 pg (ref 26.0–34.0)
MCHC: 31.8 g/dL (ref 30.0–36.0)
MCV: 90.6 fL (ref 80.0–100.0)
Monocytes Absolute: 0.5 10*3/uL (ref 0.1–1.0)
Monocytes Relative: 6 %
Neutro Abs: 7.4 10*3/uL (ref 1.7–7.7)
Neutrophils Relative %: 74 %
Platelets: 267 10*3/uL (ref 150–400)
RBC: 3.61 MIL/uL — ABNORMAL LOW (ref 4.22–5.81)
RDW: 12.8 % (ref 11.5–15.5)
WBC: 9.9 10*3/uL (ref 4.0–10.5)
nRBC: 0.2 % (ref 0.0–0.2)

## 2021-10-15 LAB — GLUCOSE, CAPILLARY
Glucose-Capillary: 196 mg/dL — ABNORMAL HIGH (ref 70–99)
Glucose-Capillary: 236 mg/dL — ABNORMAL HIGH (ref 70–99)
Glucose-Capillary: 256 mg/dL — ABNORMAL HIGH (ref 70–99)
Glucose-Capillary: 341 mg/dL — ABNORMAL HIGH (ref 70–99)

## 2021-10-15 LAB — RENAL FUNCTION PANEL
Albumin: 2.5 g/dL — ABNORMAL LOW (ref 3.5–5.0)
Anion gap: 10 (ref 5–15)
BUN: 66 mg/dL — ABNORMAL HIGH (ref 6–20)
CO2: 20 mmol/L — ABNORMAL LOW (ref 22–32)
Calcium: 8.7 mg/dL — ABNORMAL LOW (ref 8.9–10.3)
Chloride: 106 mmol/L (ref 98–111)
Creatinine, Ser: 4.53 mg/dL — ABNORMAL HIGH (ref 0.61–1.24)
GFR, Estimated: 14 mL/min — ABNORMAL LOW (ref >60)
Glucose, Bld: 225 mg/dL — ABNORMAL HIGH (ref 70–99)
Phosphorus: 3.7 mg/dL (ref 2.5–4.6)
Potassium: 5.2 mmol/L — ABNORMAL HIGH (ref 3.5–5.1)
Sodium: 136 mmol/L (ref 135–145)

## 2021-10-15 MED ORDER — SENNA 8.6 MG PO TABS
2.0000 | ORAL_TABLET | Freq: Every day | ORAL | Status: DC
  Administered 2021-10-15 – 2021-10-19 (×2): 17.2 mg via ORAL
  Filled 2021-10-15 (×5): qty 2

## 2021-10-15 NOTE — Progress Notes (Signed)
[name], male   DOB: 08/13/1965, 56 y.o.   MRN: [id number] [facility] Progress Note   Assessment/ Plan:   1.  Acute kidney injury on chronic kidney disease stage IV:  Baseline Cr 2.8-2.4.  Appears to have been hemodynamically mediated in the setting of hyperglycemia and increased insensible losses while outdoors with the increased ambient temperatures.  Suspect that he likely developed mild ischemic ATN from sustained prerenal state.  Renal function stable/plateaued with Cr in mid 4s currently.  He remains nonoliguric and does not have any acute electrolyte abnormalities to prompt dialytic intervention.  F/B Dr. Cira Servant. 2.  Klebsiella urinary tract infection: No markers of sepsis noted, ongoing treatment with intravenous ceftriaxone. 3.  Hyperglycemia: With background history of poorly controlled type 2 diabetes mellitus and likely exacerbated by recent UTI.  Glycemic management per primary service. 4. Metabolic acidosis: This appears to be largely from his acute kidney injury, continue sodium bicarbonate and treat hyperglycemia with efforts at improving renal function with fluids. Improving.  5.  Hyponatremia: Resolved with treatment for hyperglycemia.  6.  Hyperkalemia:  mild at 5.2 this AM. Monitor for now. Advised of low K diet.   Subjective:   Feeling fine.  No new issues.  UOP 1L yest.  K 5.2 - ate mashed potatoes at dinner   Objective:   BP 119/80   Pulse 75   Temp 98 F (36.7 C) (Axillary)   Resp 17   Ht 5\' 10"  (1.778 m)   Wt 122.5 kg   SpO2 93%   BMI 38.75 kg/m   Intake/Output Summary (Last 24 hours) at 10/15/2021 1131 Last data filed at 10/14/2021 2049 Gross per 24 hour  Intake --  Output 700 ml  Net -700 ml    Weight change: -2.5 kg  Physical Exam: Gen: Comfortably in bed, awakens and engages to conversation easily  CVS: Pulse regular rhythm, normal rate, S1 and S2 normal Resp: Clear to auscultation bilaterally, no  rales/rhonchi Abd: Soft, obese, nontender, bowel sounds normal Ext: Status post right BKA, left above-knee amputation with intact dressing over stump site, L AKA  Imaging: No results found.  Labs: BMET Recent Labs  Lab 10/09/21 1820 10/10/21 0434 10/11/21 0230 10/12/21 0316 10/13/21 0236 10/14/21 0117 10/15/21 0146  NA 135 133* 134* 134* 135 132* 136  K 4.7 4.3 4.8 4.6 4.8 5.0 5.2*  CL 104 107 103 104 104 102 106  CO2 20* 19* 18* 19* 20* 20* 20*  GLUCOSE 328* 382* 265* 230* 247* 293* 225*  BUN 46* 46* 45* 49* 52* 59* 66*  CREATININE 3.39* 3.62* 3.65* 4.02* 4.44* 4.56* 4.53*  CALCIUM 8.6* 8.1* 8.8* 8.8* 8.9 9.0 8.7*  PHOS  --   --   --   --  4.0 4.1 3.7    CBC Recent Labs  Lab 10/09/21 0838 10/10/21 0434 10/11/21 0230 10/15/21 0146  WBC 8.5 6.7 6.4 9.9  NEUTROABS 6.0  --  4.1 7.4  HGB 12.2* 10.9* 11.2* 10.4*  HCT 36.4* 34.1* 35.5* 32.7*  MCV 88.8 89.7 92.0 90.6  PLT 244 240 257 267     Medications:     atorvastatin  80 mg Oral Daily   carvedilol  3.125 mg Oral BID WC   dapsone  100 mg Oral Daily   dolutegravir-lamiVUDine  1 tablet Oral Daily   doravirine  100 mg Oral Daily   ferrous sulfate  325 mg Oral Q M,W,F-1800   finasteride  5 mg Oral  Daily   gabapentin  100 mg Oral TID   heparin injection (subcutaneous)  5,000 Units Subcutaneous Q8H   icosapent Ethyl  2 g Oral BID   insulin aspart  0-5 Units Subcutaneous QHS   insulin aspart  0-9 Units Subcutaneous TID WC   insulin aspart  3 Units Subcutaneous TID WC   insulin glargine-yfgn  12 Units Subcutaneous Daily   mupirocin cream   Topical Daily   pantoprazole  40 mg Oral Daily   senna  2 tablet Oral QHS   sodium bicarbonate  650 mg Oral BID  Jannifer Hick MD [facility] Pager [phone]

## 2021-10-15 NOTE — Evaluation (Addendum)
Occupational Therapy Evaluation Patient Details Name: Keith [name] MRN: [id number] DOB: March 04, 1966 Today's Date: 10/15/2021   History of Present Illness Pt is 56 yo male who is homeless, presents to [facility] on 10/08/21 with hyperglycemia and volume contraction worsened by spending a lot of time outdoors. Has recent Klebsiella UTI.  PMHx:  R BKA, L AKA, AIDS, CVA, CAD, MI, HTN, CKD, DM2, PVD, CKD   Clinical Impression   Pt reports independence prior. Pt currently, set-upA to maxA for ADL. Pt currently, performing RLE BKA and use of RW for stability. Pt limited by fatigue and LLE wound not being able to don prosthetic. Pt standing with RW with supervisionA for maxA for pericare. Pt performing bed mobility and transfers with supervisionA. Pt appears to be close to baseline for ADL, but limited by safety due to not being able to walk on LLE. Pt would benefit from continued OT skilled services. OT following acutely.     Recommendations for follow up therapy are one component of a multi-disciplinary discharge planning process, led by the attending physician.  Recommendations may be updated based on patient status, additional functional criteria and insurance authorization.   Follow Up Recommendations  Skilled nursing-short term rehab (<3 hours/day)    Assistance Recommended at Discharge Intermittent Supervision/Assistance  Patient can return home with the following Assist for transportation;Help with stairs or ramp for entrance;A little help with bathing/dressing/bathroom    Functional Status Assessment  Patient has had a recent decline in their functional status and demonstrates the ability to make significant improvements in function in a reasonable and predictable amount of time.  Equipment Recommendations  None recommended by OT    Recommendations for Other Services       Precautions / Restrictions Precautions Precautions: Fall Precaution Comments: R BKA, L AKA Restrictions Weight Bearing  Restrictions: No      Mobility Bed Mobility Overal bed mobility: Modified Independent                  Transfers Overall transfer level: Needs assistance Equipment used: Rolling walker (2 wheels) Transfers: Bed to chair/wheelchair/BSC, Sit to/from Stand Sit to Stand: Supervision     Step pivot transfers: Min guard    Lateral/Scoot Transfers: Supervision General transfer comment: perofrming bed mobility to EOB; scooting to recliner x2 times and standing from bed very elevated.      Balance Overall balance assessment: Mild deficits observed, not formally tested                                         ADL either performed or assessed with clinical judgement   ADL Overall ADL's : Needs assistance/impaired Eating/Feeding: Set up;Sitting   Grooming: Set up;Sitting   Upper Body Bathing: Set up;Sitting   Lower Body Bathing: Maximal assistance;Sit to/from stand Lower Body Bathing Details (indicate cue type and reason): maxA for pericar today in standing with RW Upper Body Dressing : Set up;Sitting   Lower Body Dressing: Min guard;Sit to/from stand Lower Body Dressing Details (indicate cue type and reason): for donning prosthetic Toilet Transfer: Supervision/safety;Stand-pivot;Cueing for safety;Rolling walker (2 wheels) Toilet Transfer Details (indicate cue type and reason): does not want assist for transfers         Functional mobility during ADLs: Supervision/safety;Rolling walker (2 wheels) General ADL Comments: Pt limited by fatigue and LLE wound not being able to don prosthetic.     Vision Baseline  Vision/History: 0 No visual deficits Ability to See in Adequate Light: 0 Adequate Patient Visual Report: No change from baseline Vision Assessment?: No apparent visual deficits     Perception     Praxis      Pertinent Vitals/Pain       Hand Dominance Right   Extremity/Trunk Assessment Upper Extremity Assessment Upper Extremity  Assessment: Overall WFL for tasks assessed   Lower Extremity Assessment Lower Extremity Assessment: Defer to PT evaluation;RLE deficits/detail;LLE deficits/detail RLE Deficits / Details: R BKA, donning RLE prosthesis RLE Sensation: history of peripheral neuropathy RLE Coordination: decreased gross motor LLE Deficits / Details: Has L prosthesis but has open wound L residual limb so did not allow him to don L prosthesis today LLE Sensation: history of peripheral neuropathy LLE Coordination: decreased gross motor   Cervical / Trunk Assessment Cervical / Trunk Assessment: Normal   Communication Communication Communication: No difficulties   Cognition Arousal/Alertness: Awake/alert Behavior During Therapy: Impulsive Overall Cognitive Status: No family/caregiver present to determine baseline cognitive functioning Area of Impairment: Safety/judgement                         Safety/Judgement: Decreased awareness of deficits     General Comments: some lack of insight into deficits     General Comments  VSS    Exercises     Shoulder Instructions      Home Living Family/patient expects to be discharged to:: Unsure Living Arrangements: Alone                               Additional Comments: homeless      Prior Functioning/Environment Prior Level of Function : Independent/Modified Independent             Mobility Comments: Uses rollator and bilateral prosthetic for short distance ambulation. Uses WC for further mobility ADLs Comments: limited by living situation        OT Problem List: Decreased strength;Decreased activity tolerance;Decreased safety awareness      OT Treatment/Interventions: Self-care/ADL training;Therapeutic exercise;Modalities;Energy conservation;DME and/or AE instruction;Patient/family education;Balance training;Therapeutic activities    OT Goals(Current goals can be found in the care plan section) Acute Rehab OT  Goals Patient Stated Goal: to be able to walk OT Goal Formulation: With patient Time For Goal Achievement: 10/29/21 Potential to Achieve Goals: Good  OT Frequency: Min 2X/week    Co-evaluation              AM-PAC OT "6 Clicks" Daily Activity     Outcome Measure Help from another person eating meals?: None Help from another person taking care of personal grooming?: A Little Help from another person toileting, which includes using toliet, bedpan, or urinal?: A Lot Help from another person bathing (including washing, rinsing, drying)?: A Lot Help from another person to put on and taking off regular upper body clothing?: A Little Help from another person to put on and taking off regular lower body clothing?: A Lot 6 Click Score: 16   End of Session Equipment Utilized During Treatment: Rolling walker (2 wheels) Nurse Communication: Mobility status  Activity Tolerance: Patient tolerated treatment well Patient left: in chair;with call bell/phone within reach;with chair alarm set  OT Visit Diagnosis: Unsteadiness on feet (R26.81);History of falling (Z91.81)                Time: 5974-1638 OT Time Calculation (min): 41 min Charges:  OT General Charges $OT Visit:  1 Visit OT Evaluation $OT Eval Moderate Complexity: 1 Mod OT Treatments $Self Care/Home Management : 8-22 mins $Therapeutic Activity: 8-22 mins Jefferey Pica, OTR/L Acute Rehabilitation Services Office: [place] 10/15/2021, 3:53 PM

## 2021-10-15 NOTE — Progress Notes (Signed)
Mobility Specialist Progress Note    10/15/21 1209  Mobility  Activity  (leg exercises)  Level of Assistance Independent  Assistive Device None  Activity Response Tolerated well  $Mobility charge 1 Mobility   Pt received in chair and agreeable. Attempted x1 STS but pt was MaxA. Completed multiple sets of RLE exercises. Left with call bell in reach.   Hildred Alamin Mobility Specialist

## 2021-10-15 NOTE — TOC Progression Note (Addendum)
Transition of Care [facility]) - Progression Note    Patient Details  Name: Keith [name] MRN: [id number] Date of Birth: [birth year]  Transition of Care [facility]) CM/SW [place], [place] Phone Number: 10/15/2021, 12:35 PM  Clinical Narrative:     Update-5:11pm- CSW received call from Graham with [facility] they plan on coming to see patient tomorrow to help determine on if they can offer ALF bed for patient. CSW will continue to follow.  Update-CSW received callback from [facility] who confirmed unable to make bed offer. Facility confirmed unable to meet patients needs medically. CSW informed patient. Patient request for CSW to call his caseworker with [facility] [place]. CSW [facility] and updated her on current status of ALF placement. Sheletha informed CSW she plans to meet eventually with disability advocacy center and partners to end homelessness regarding patient. Sheletha is hopeful that we can find ALF placement for patient. CSW awaiting determination with [facility] in [place]. CSW will continue to follow.  Update- Patient gave CSW permission to fax referral to [facility]. CSW spoke with Juanda Crumble with [facility]. CSW faxed over referral to [facility] with [facility] home.  No current ALF bed offers.   CSW will continue to follow. CSW heard from [facility] and [facility]. Both ALFs declined placement for patient. CSW updated patient. Patient gave CSW permission to fax out referral to ALFs in [place] and [place]. Patient also gave CSW permission to fax referral to [facility] in [place].CSW faxed out initial referral to ALF in  [place] and [place] and [place]. CSW awaiting determination. CSW informed CM and MD. CSW will continue to follow and assist with patients dc planning needs.  Expected Discharge Plan: Homeless Shelter Barriers to Discharge: Continued Medical Work up (Homeless)  Expected Discharge Plan  and Services Expected Discharge Plan: Homeless Shelter In-house Referral: Clinical Social Work Discharge Planning Services: CM Consult Post Acute Care Choice: NA Living arrangements for the past 2 months: No permanent address (trying to seek out boarding homes.)                                       Social Determinants of Health (SDOH) Interventions    Readmission Risk Interventions    10/10/2021    1:31 PM 10/21/2020   11:53 AM 08/31/2020    2:30 PM  Readmission Risk Prevention Plan  Transportation Screening Complete Complete   PCP or Specialist Appt within 3-5 Days     HRI or [place] Work Consult for [place] Planning/Counseling     Palliative Care Screening     Medication Review Press photographer) Complete Complete   PCP or Specialist appointment within 3-5 days of discharge Complete Complete   HRI or [place] Complete Complete   SW Recovery Care/Counseling Consult Complete Complete   Palliative Care Screening Not Applicable Not [place] Not Applicable Complete      Information is confidential and restricted. Go to Review Flowsheets to unlock data.

## 2021-10-15 NOTE — Progress Notes (Addendum)
PROGRESS NOTE  Keith [name] BPZ:[id number] DOB: 1965-11-03 DOA: 10/08/2021 [name], NP  HPI/Recap of past 24 hours: Keith [name] is a 56 y.o. male with past medical history of HIV/AIDS on antiretroviral therapy, hypertension, hyperlipidemia, IDDM2, stage IV CKD, anemia of chronic disease, BPH, bilateral amputee, homelessness, who presented to hospital with complaints of elevated blood glucose level.  Work-up revealed type 2 diabetes with hyperglycemia, AKI on CKD 4.  Hospital course complicated by left stump wound and pain, present on admission.  10/15/2021:  The patient was seen and examined at his bedside.  He has no new complaints today.  Awaiting placement at ALF.  Assessment/Plan: Principal Problem:   Hyperglycemia Active Problems:   HTN (hypertension)   Hyperlipidemia   HIV disease (HCC)   Acute renal failure superimposed on stage 4 chronic kidney disease (HCC)   Anemia   BPH (benign prostatic hyperplasia)   GERD (gastroesophageal reflux disease)  Type 2 diabetes with hyperglycemia  -  Latest hemoglobin A1c of 6.0% on 08/17/2021.  - Received IV fluids and insulin drip during hospitalization, now back on subQ insulin -Home metformin discontinued due to renal impairment. -Seen by diabetic coordinator for education on use of insulin.   Acute Kidney Injury on CKD 4 with mild bilateral hydronephrosis in the setting of prostatomegaly Baseline creatinine appears to be 3.3 with GFR of 20. Creatinine presently 4.53 with GFR 40 Hold home metformin and lisinopril. Concern for chronic urinary retention Perform bladder scan every shift x3 occurrences. Continue to avoid nephrotoxic agents, dehydration and hypotension. Continue to monitor urine output, last urine output 1 L in the last 24 hours. Repeat renal panel in the morning.  Klebsiella pneumonia UTI, POA Urine culture growing greater than 100,000 colonies of Klebsiella pneumoniae Sensitivities are pending, follow  to narrow down antibiotics. Currently afebrile with no leukocytosis. He is on Rocephin empirically, continue for now.  Bilateral mild hydronephrosis, rule out chronic urinary retention Perform bladder scan every shift and document results. Urinary retention protocol if indicated.   Benign Prostatic Hyperplasia: Continue home finasteride.  Does not take flomax due to history of sulfa allergy.   Continue to monitor urine output.   HIV/AIDS: Most recent CD4 count noted to be 129 on 08/17/2021.  On Dovato and Doravirine as an outpatient, noting good compliance with these antiviral medications.  Also on prophylactic dapsone.  Continue antiretroviral treatment     Essential Hypertension:  BP is at goal SBP 119/80. Continue Coreg 3.125 mg twice daily Continue to closely monitor vital signs.   Hyperlipidemia:  Continue home statin and Vascepa.    GERD:  Continue Protonix   Left stump wound/ Leg pain.   Reports ill fitting prothesis  Continue local wound care Pain control and bowel regimen   MRSA colonization;  Continue decolinization   Homelessness  Bilateral amputee, with ill fitting prosthesis, stump wound,  Reports has a walker and wheelchair in his car CSW assisting with DC planning.      DVT prophylaxis: heparin injection 5,000 Units Start: 10/09/21 1400     Code Status:     Code Status: Full Code   Disposition: Pending placement ALF vs snf.  Anticipate discharge date 10/17/2021.   Status is: Inpatient   Remains inpatient appropriate because: awaiting for culture result , need cr to improve, homeless,      Family Communication: Communicated with the patient at bedside   Consultants:  Wound care Diabetes RN nephrology   Procedures:  None  Antimicrobials:  Reocephin     Status is: Inpatient The patient requires at least 2 midnights for further evaluation and treatment of present condition.    Objective: Vitals:   10/14/21 1351 10/14/21 2042 10/15/21  0500 10/15/21 0514  BP: 128/78 103/63  124/78  Pulse:  74  74  Resp: 18 18  17   Temp:  98.6 F (37 C)  98 F (36.7 C)  TempSrc:  Oral  Axillary  SpO2:    93%  Weight:   122.5 kg   Height:        Intake/Output Summary (Last 24 hours) at 10/15/2021 0936 Last data filed at 10/14/2021 2049 Gross per 24 hour  Intake --  Output 700 ml  Net -700 ml   Filed Weights   10/13/21 0548 10/14/21 0500 10/15/21 0500  Weight: 125.8 kg 125 kg 122.5 kg    Exam:  General: 56 y.o. year-old male well developed well nourished in no acute distress.  Alert and oriented x3. Cardiovascular: Regular rate and rhythm with no rubs or gallops.  No thyromegaly or JVD noted.   Respiratory: Clear to auscultation with no wheezes or rales. Good inspiratory effort. Abdomen: Soft nontender nondistended with normal bowel sounds x4 quadrants. Musculoskeletal: No lower extremity edema.  Bilateral amputee. Psychiatry: Mood is appropriate for condition and setting   Data Reviewed: CBC: Recent Labs  Lab 10/08/21 1620 10/08/21 1743 10/09/21 0838 10/10/21 0434 10/11/21 0230 10/15/21 0146  WBC 8.1  --  8.5 6.7 6.4 9.9  NEUTROABS  --   --  6.0  --  4.1 7.4  HGB 12.4* 13.6 12.2* 10.9* 11.2* 10.4*  HCT 37.4* 40.0 36.4* 34.1* 35.5* 32.7*  MCV 90.8  --  88.8 89.7 92.0 90.6  PLT 247  --  244 240 257 681   Basic Metabolic Panel: Recent Labs  Lab 10/09/21 0838 10/09/21 1440 10/10/21 0434 10/11/21 0230 10/12/21 0316 10/13/21 0236 10/14/21 0117 10/15/21 0146  NA 136   < > 133* 134* 134* 135 132* 136  K 4.0   < > 4.3 4.8 4.6 4.8 5.0 5.2*  CL 101   < > 107 103 104 104 102 106  CO2 25   < > 19* 18* 19* 20* 20* 20*  GLUCOSE 176*   < > 382* 265* 230* 247* 293* 225*  BUN 41*   < > 46* 45* 49* 52* 59* 66*  CREATININE 3.34*   < > 3.62* 3.65* 4.02* 4.44* 4.56* 4.53*  CALCIUM 8.9   < > 8.1* 8.8* 8.8* 8.9 9.0 8.7*  MG 1.8  --  1.9  --   --  2.1  --   --   PHOS  --   --   --   --   --  4.0 4.1 3.7   < > = values in  this interval not displayed.   GFR: Estimated Creatinine Clearance: 23.9 mL/min (A) (by C-G formula based on SCr of 4.53 mg/dL (H)). Liver Function Tests: Recent Labs  Lab 10/11/21 0230 10/13/21 0236 10/14/21 0117 10/15/21 0146  AST 15  --   --   --   ALT 11  --   --   --   ALKPHOS 96  --   --   --   BILITOT 0.4  --   --   --   PROT 6.7  --   --   --   ALBUMIN 2.7* 2.7* 2.9* 2.5*   No results for input(s): "LIPASE", "AMYLASE" in the last  168 hours. No results for input(s): "AMMONIA" in the last 168 hours. Coagulation Profile: No results for input(s): "INR", "PROTIME" in the last 168 hours. Cardiac Enzymes: Recent Labs  Lab 10/09/21 0838  CKTOTAL 325   BNP (last 3 results) No results for input(s): "PROBNP" in the last 8760 hours. HbA1C: No results for input(s): "HGBA1C" in the last 72 hours. CBG: Recent Labs  Lab 10/14/21 0731 10/14/21 1304 10/14/21 1601 10/14/21 2127 10/15/21 0822  GLUCAP 229* 230* 148* 172* 196*   Lipid Profile: No results for input(s): "CHOL", "HDL", "LDLCALC", "TRIG", "CHOLHDL", "LDLDIRECT" in the last 72 hours. Thyroid Function Tests: No results for input(s): "TSH", "T4TOTAL", "FREET4", "T3FREE", "THYROIDAB" in the last 72 hours. Anemia Panel: No results for input(s): "VITAMINB12", "FOLATE", "FERRITIN", "TIBC", "IRON", "RETICCTPCT" in the last 72 hours. Urine analysis:    Component Value Date/Time   COLORURINE AMBER (A) 10/12/2021 1731   APPEARANCEUR TURBID (A) 10/12/2021 1731   LABSPEC 1.013 10/12/2021 1731   PHURINE 5.0 10/12/2021 1731   GLUCOSEU 50 (A) 10/12/2021 1731   HGBUR SMALL (A) 10/12/2021 1731   BILIRUBINUR NEGATIVE 10/12/2021 1731   BILIRUBINUR negative 09/07/2018 1034   KETONESUR NEGATIVE 10/12/2021 1731   PROTEINUR >=300 (A) 10/12/2021 1731   UROBILINOGEN 1.0 09/07/2018 1034   UROBILINOGEN 1.0 01/01/2017 1032   NITRITE NEGATIVE 10/12/2021 1731   LEUKOCYTESUR LARGE (A) 10/12/2021 1731   Sepsis  Labs: @LABRCNTIP (procalcitonin:4,lacticidven:4)  ) Recent Results (from the past 240 hour(s))  MRSA Next Gen by PCR, Nasal     Status: Abnormal   Collection Time: 10/09/21  2:20 PM   Specimen: Nasal Mucosa; Nasal Swab  Result Value Ref Range Status   MRSA by PCR Next Gen DETECTED (A) NOT DETECTED Final    Comment: RESULT CALLED TO, READ BACK BY AND VERIFIED WITH: RN LINDA CURRAN ON 10/09/21 @ 1549 BY DRT (NOTE) The GeneXpert MRSA Assay (FDA approved for NASAL specimens only), is one component of a comprehensive MRSA colonization surveillance program. It is not intended to diagnose MRSA infection nor to guide or monitor treatment for MRSA infections. Test performance is not FDA approved in patients less than 76 years old. Performed at [facility], [place] [address] [place], [place] [address]   Urine Culture     Status: Abnormal (Preliminary result)   Collection Time: 10/10/21  6:43 PM   Specimen: Urine, Clean Catch  Result Value Ref Range Status   Specimen Description URINE, CLEAN CATCH  Final   Special Requests NONE  Final   Culture (A)  Final    >=100,000 COLONIES/mL KLEBSIELLA PNEUMONIAE SUSCEPTIBILITIES TO FOLLOW Performed at [facility], [place] [address] [place], [place] [address]    Report Status PENDING  Incomplete  Culture, blood (Routine X 2) w Reflex to ID Panel     Status: None (Preliminary result)   Collection Time: 10/14/21  2:54 PM   Specimen: BLOOD RIGHT HAND  Result Value Ref Range Status   Specimen Description BLOOD RIGHT HAND  Final   Special Requests   Final    BOTTLES DRAWN AEROBIC AND ANAEROBIC Blood Culture adequate volume   Culture   Final    NO GROWTH < 24 HOURS Performed at [facility], [place] [address] [place], [place] [address]    Report Status PENDING  Incomplete  Culture, blood (Routine X 2) w Reflex to ID Panel     Status: None (Preliminary result)   Collection Time: 10/14/21  2:54 PM   Specimen:  BLOOD  Result Value Ref Range  Status   Specimen Description BLOOD RIGHT ANTECUBITAL  Final   Special Requests   Final    BOTTLES DRAWN AEROBIC AND ANAEROBIC Blood Culture adequate volume   Culture   Final    NO GROWTH < 24 HOURS Performed at [facility], [address] [place], [place] [address]    Report Status PENDING  Incomplete      Studies: No results found.  Scheduled Meds:  atorvastatin  80 mg Oral Daily   carvedilol  3.125 mg Oral BID WC   dapsone  100 mg Oral Daily   dolutegravir-lamiVUDine  1 tablet Oral Daily   doravirine  100 mg Oral Daily   ferrous sulfate  325 mg Oral Q M,W,F-1800   finasteride  5 mg Oral Daily   gabapentin  100 mg Oral TID   heparin injection (subcutaneous)  5,000 Units Subcutaneous Q8H   icosapent Ethyl  2 g Oral BID   insulin aspart  0-5 Units Subcutaneous QHS   insulin aspart  0-9 Units Subcutaneous TID WC   insulin aspart  3 Units Subcutaneous TID WC   insulin glargine-yfgn  12 Units Subcutaneous Daily   mupirocin cream   Topical Daily   pantoprazole  40 mg Oral Daily   senna-docusate  1 tablet Oral QHS   sodium bicarbonate  650 mg Oral BID    Continuous Infusions:  cefTRIAXone (ROCEPHIN)  IV 1 g (10/14/21 0844)     LOS: 7 days     Kayleen Memos, MD [facility] Pager [phone]  If 7PM-7AM, please contact night-coverage [email or web address] Password [facility] 10/15/2021, 9:36 AM

## 2021-10-16 DIAGNOSIS — E1169 Type 2 diabetes mellitus with other specified complication: Secondary | ICD-10-CM

## 2021-10-16 DIAGNOSIS — E785 Hyperlipidemia, unspecified: Secondary | ICD-10-CM

## 2021-10-16 DIAGNOSIS — N179 Acute kidney failure, unspecified: Secondary | ICD-10-CM | POA: Diagnosis not present

## 2021-10-16 DIAGNOSIS — K219 Gastro-esophageal reflux disease without esophagitis: Secondary | ICD-10-CM | POA: Diagnosis not present

## 2021-10-16 DIAGNOSIS — B20 Human immunodeficiency virus [HIV] disease: Secondary | ICD-10-CM | POA: Diagnosis not present

## 2021-10-16 DIAGNOSIS — R739 Hyperglycemia, unspecified: Secondary | ICD-10-CM | POA: Diagnosis not present

## 2021-10-16 LAB — RENAL FUNCTION PANEL
Albumin: 2.6 g/dL — ABNORMAL LOW (ref 3.5–5.0)
Anion gap: 9 (ref 5–15)
BUN: 68 mg/dL — ABNORMAL HIGH (ref 6–20)
CO2: 21 mmol/L — ABNORMAL LOW (ref 22–32)
Calcium: 8.8 mg/dL — ABNORMAL LOW (ref 8.9–10.3)
Chloride: 106 mmol/L (ref 98–111)
Creatinine, Ser: 4.39 mg/dL — ABNORMAL HIGH (ref 0.61–1.24)
GFR, Estimated: 15 mL/min — ABNORMAL LOW (ref >60)
Glucose, Bld: 293 mg/dL — ABNORMAL HIGH (ref 70–99)
Phosphorus: 3.8 mg/dL (ref 2.5–4.6)
Potassium: 5.5 mmol/L — ABNORMAL HIGH (ref 3.5–5.1)
Sodium: 136 mmol/L (ref 135–145)

## 2021-10-16 LAB — URINE CULTURE: Culture: >=100000 — AB

## 2021-10-16 LAB — GLUCOSE, CAPILLARY
Glucose-Capillary: 271 mg/dL — ABNORMAL HIGH (ref 70–99)
Glucose-Capillary: 252 mg/dL — ABNORMAL HIGH (ref 70–99)
Glucose-Capillary: 232 mg/dL — ABNORMAL HIGH (ref 70–99)
Glucose-Capillary: 277 mg/dL — ABNORMAL HIGH (ref 70–99)

## 2021-10-16 MED ORDER — SODIUM CHLORIDE 0.9 % IV SOLN
1.0000 g | Freq: Two times a day (BID) | INTRAVENOUS | Status: DC
  Administered 2021-10-16 – 2021-10-19 (×6): 1 g via INTRAVENOUS
  Filled 2021-10-16 (×7): qty 20

## 2021-10-16 MED ORDER — SODIUM ZIRCONIUM CYCLOSILICATE 10 G PO PACK
10.0000 g | PACK | Freq: Two times a day (BID) | ORAL | Status: DC
  Filled 2021-10-16: qty 1

## 2021-10-16 MED ORDER — INSULIN GLARGINE-YFGN 100 UNIT/ML ~~LOC~~ SOLN
15.0000 [IU] | Freq: Every day | SUBCUTANEOUS | Status: DC
  Administered 2021-10-17 – 2021-10-18 (×2): 15 [IU] via SUBCUTANEOUS
  Filled 2021-10-16 (×2): qty 0.15

## 2021-10-16 MED ORDER — SODIUM ZIRCONIUM CYCLOSILICATE 10 G PO PACK
10.0000 g | PACK | Freq: Once | ORAL | Status: AC
Start: 2021-10-16 — End: 2021-10-16
  Administered 2021-10-16: 10 g via ORAL
  Filled 2021-10-16: qty 1

## 2021-10-16 MED ORDER — INSULIN ASPART 100 UNIT/ML IJ SOLN
5.0000 [IU] | Freq: Three times a day (TID) | INTRAMUSCULAR | Status: DC
  Administered 2021-10-16 – 2021-10-19 (×11): 5 [IU] via SUBCUTANEOUS

## 2021-10-16 NOTE — Plan of Care (Signed)

## 2021-10-16 NOTE — TOC Progression Note (Signed)
Transition of Care [facility]) - Progression Note    Patient Details  Name: Keith [name] MRN: [id number] Date of Birth: January 02, 1966  Transition of Care [facility]) CM/SW [place], [place] Phone Number: 10/16/2021, 12:02 PM  Clinical Narrative:     [name] with [facility] going to stop by to see patient before 3:00pm today for ALF bed determination. CSW will continue to follow and assist with patients dc planning needs.  Expected Discharge Plan: Homeless Shelter Barriers to Discharge: Continued Medical Work up (Homeless)  Expected Discharge Plan and Services Expected Discharge Plan: Homeless Shelter In-house Referral: Clinical Social Work Discharge Planning Services: CM Consult Post Acute Care Choice: NA Living arrangements for the past 2 months: No permanent address (trying to seek out boarding homes.)                                       Social Determinants of Health (SDOH) Interventions    Readmission Risk Interventions    10/10/2021    1:31 PM 10/21/2020   11:53 AM 08/31/2020    2:30 PM  Readmission Risk Prevention Plan  Transportation Screening Complete Complete   PCP or Specialist Appt within 3-5 Days     HRI or [place] Work Consult for [place] Planning/Counseling     Palliative Care Screening     Medication Review Press photographer) Complete Complete   PCP or Specialist appointment within 3-5 days of discharge Complete Complete   HRI or [place] Complete Complete   SW Recovery Care/Counseling Consult Complete Complete   Palliative Care Screening Not Applicable Not [place] Not Applicable Complete      Information is confidential and restricted. Go to Review Flowsheets to unlock data.

## 2021-10-16 NOTE — Progress Notes (Signed)
[facility]                                                                               Keith [name], is a 56 y.o. male, DOB - Sep 19, 1965, UKG:[id number] Admit date - 10/08/2021    Outpatient Primary MD for the patient is [name], NP  LOS - 8  days    Brief summary   Keith [name] is a 56 y.o. male with past medical history of HIV/ AIDS on antiretroviral therapy, hypertension, hyperlipidemia, stage IV CKD with baseline creatinine of 2.8-3.4, type 2 diabetes mellitus, anemia of chronic disease, BPH presented to hospital with elevated blood glucose level.  Patient stated that he was spending his time outside for last 2 to 3 days and glucometer was reading high.  He reported good compliance with basal insulin but not with mealtime insulin.  Patient was hospitalized in May 2023 for hyperglycemia.  In the ED, vitals were stable.  Blood glucose level was more than 600.  Sodium was 123 and corrected to 133.  Anion gap 11.  CBC showed normal WBC count. Patient received LR bolus 2.5 L and insulin drip and was admitted to hospital for hyperglycemia and AKI.      Assessment & Plan    Assessment and Plan:    Hyperglycemia, uncontrolled type 2 diabetes.   Non compliant to meds.  CBG (last 3)  Recent Labs    10/16/21 0755 10/16/21 1306 10/16/21 1625  GLUCAP 271* 252* 232*   Increased semglee to 15 units daily. Increased novolog to 5 units TIDAC.  Last A1c is 6%.     Acute Kidney Injury on CKD 4 with bilateral mild hydronephrosis  Baseline creatinine appears to be at 3.3. suspect a component of urinary retention.  Creatinine at 4.3 today.  Avoid nephrotoxins, dehydration and hypotension.  Good urine output.   HIV/AIDS: Most recent CD4 count noted to be 129 on 08/17/2021.   Resume anti viral treatment.   Klebsiella UTI present on admission:  Rocephin changed to Meropenam.    Essential Hypertension:  On Coreg, Norvasc and lisinopril as outpatient.   Holding  lisinopril for AKI.    Hyperlipidemia: Continue statin and Vascepa.    GERD: Continue Protonix   Anemia of chronic disease:  Hemoglobin of 10-12.  We will continue to monitor.    Benign Prostatic Hyperplasia: Continue finasteride. Does not take flomax due to sulfa allergy.   Left stump wound with leg pain.  Patient takes Percocet at home.   Pain control.    MRSA colonization:     Homelessness Bilateral amputee with ill fitting prosthesis , stump wound.  CSW on board.        Estimated body mass index is 39.6 kg/m as calculated from the following:   Height as of this encounter: 5\' 10"  (1.778 m).   Weight as of this encounter: 125.2 kg.  Code Status: FULL CODE.  DVT Prophylaxis:  heparin injection 5,000 Units Start: 10/09/21 1400   Level of Care: Level of care: Progressive Family Communication: nONE AT BEDSIDE.   Disposition Plan:     Remains inpatient appropriate:  HOMELESSNESS.   Procedures:  NONE.  Consultants:   NONE.   Antimicrobials:   Anti-infectives (From admission, onward)    Start     Dose/Rate Route Frequency Ordered Stop   10/12/21 0945  cefTRIAXone (ROCEPHIN) 1 g in sodium chloride 0.9 % 100 mL IVPB        1 g 200 mL/hr over 30 Minutes Intravenous Every 24 hours 10/12/21 0859     10/09/21 1000  dapsone tablet 100 mg        100 mg Oral Daily 10/09/21 0129     10/09/21 1000  dolutegravir-lamiVUDine (DOVATO) 50-300 MG per tablet 1 tablet        1 tablet Oral Daily 10/09/21 0129     10/09/21 1000  doravirine (PIFELTRO) tablet 100 mg        100 mg Oral Daily 10/09/21 0129          Medications  Scheduled Meds:  atorvastatin  80 mg Oral Daily   carvedilol  3.125 mg Oral BID WC   dapsone  100 mg Oral Daily   dolutegravir-lamiVUDine  1 tablet Oral Daily   doravirine  100 mg Oral Daily   ferrous sulfate  325 mg Oral Q M,W,F-1800   finasteride  5 mg Oral Daily   gabapentin  100 mg Oral TID   heparin injection (subcutaneous)  5,000 Units  Subcutaneous Q8H   icosapent Ethyl  2 g Oral BID   insulin aspart  0-5 Units Subcutaneous QHS   insulin aspart  0-9 Units Subcutaneous TID WC   insulin aspart  5 Units Subcutaneous TID WC   [START ON 10/17/2021] insulin glargine-yfgn  15 Units Subcutaneous Daily   mupirocin cream   Topical Daily   pantoprazole  40 mg Oral Daily   senna  2 tablet Oral QHS   sodium bicarbonate  650 mg Oral BID   sodium zirconium cyclosilicate  10 g Oral Once   Continuous Infusions:  cefTRIAXone (ROCEPHIN)  IV 1 g (10/16/21 0841)   PRN Meds:.acetaminophen **OR** acetaminophen, dextrose, naLOXone (NARCAN)  injection, ondansetron (ZOFRAN) IV, mouth rinse, oxyCODONE-acetaminophen **AND** oxyCODONE    Subjective:   Keith [name] was seen and examined today.  No new complaints.   Objective:   Vitals:   10/16/21 0432 10/16/21 0440 10/16/21 0600 10/16/21 0837  BP:  136/83  135/81  Pulse:  77  68  Resp:  16 14   Temp:      TempSrc:      SpO2:      Weight: 125.2 kg     Height:        Intake/Output Summary (Last 24 hours) at 10/16/2021 1236 Last data filed at 10/16/2021 0713 Gross per 24 hour  Intake --  Output 2930 ml  Net -2930 ml   Filed Weights   10/14/21 0500 10/15/21 0500 10/16/21 0432  Weight: 125 kg 122.5 kg 125.2 kg     Exam General: Alert and oriented x 3, NAD Cardiovascular: S1 S2 auscultated, no murmurs, RRR Respiratory: Clear to auscultation bilaterally, no wheezing, rales or rhonchi Gastrointestinal: Soft, nontender, nondistended, + bowel sounds Ext: bilateral amputee.  Neuro: AAOx3, Cr N's II- XII.  Skin: No rashes Psych: Normal affect and demeanor, alert and oriented x3    Data Reviewed:  I have personally reviewed following labs and imaging studies   CBC Lab Results  Component Value Date   WBC 9.9 10/15/2021   RBC 3.61 (L) 10/15/2021   HGB 10.4 (L) 10/15/2021   HCT 32.7 (L) 10/15/2021   MCV 90.6 10/15/2021  MCH 28.8 10/15/2021   PLT 267 10/15/2021   MCHC 31.8  10/15/2021   RDW 12.8 10/15/2021   LYMPHSABS 1.8 10/15/2021   MONOABS 0.5 10/15/2021   EOSABS 0.1 10/15/2021   BASOSABS 0.0 43/32/9518     Last metabolic panel Lab Results  Component Value Date   NA 136 10/16/2021   K 5.5 (H) 10/16/2021   CL 106 10/16/2021   CO2 21 (L) 10/16/2021   BUN 68 (H) 10/16/2021   CREATININE 4.39 (H) 10/16/2021   GLUCOSE 293 (H) 10/16/2021   GFRNONAA 15 (L) 10/16/2021   GFRAA 35 (L) 08/23/2019   CALCIUM 8.8 (L) 10/16/2021   PHOS 3.8 10/16/2021   PROT 6.7 10/11/2021   ALBUMIN 2.6 (L) 10/16/2021   LABGLOB 4.1 09/07/2018   AGRATIO 0.9 (L) 09/07/2018   BILITOT 0.4 10/11/2021   ALKPHOS 96 10/11/2021   AST 15 10/11/2021   ALT 11 10/11/2021   ANIONGAP 9 10/16/2021    CBG (last 3)  Recent Labs    10/15/21 1611 10/15/21 2130 10/16/21 0755  GLUCAP 256* 341* 271*      Coagulation Profile: No results for input(s): "INR", "PROTIME" in the last 168 hours.   Radiology Studies: No results found.     Hosie Poisson M.D. [facility] 10/16/2021, 12:36 PM  Available via Epic secure chat 7am-7pm After 7 pm, please refer to night coverage provider listed on amion.

## 2021-10-16 NOTE — Progress Notes (Signed)
Physical Therapy Treatment Patient Details Name: Keith [name] MRN: [id number] DOB: 01/02/1966 Today's Date: 10/16/2021   History of Present Illness Pt is 56 yo male who is homeless, presents to [facility] on 10/08/21 with hyperglycemia and volume contraction worsened by spending a lot of time outdoors. Has recent Klebsiella UTI.  PMHx:  R BKA, L AKA, AIDS, CVA, CAD, MI, HTN, CKD, DM2, PVD, CKD    PT Comments    Pt received supine and agreeable to session. Pt needing increased time to don RLE prosthesis, ultimately successful. Once donned pt demonstrating ability to come to standing on RLE without physical assist from elevated bed with fair stability, pt with slightly anterior flexed trunk with ability to correct with cues. Pt able to perform all exercises in standing and hop to pivot toward R to recliner with close guard for safety. Pt preference throughout session for no hands on assist. Pt continues to be motivated to progress ambulation as soon as safe to don LLE prosthesis. Pt continues to benefit from skilled PT services to progress toward functional mobility goals.    Recommendations for follow up therapy are one component of a multi-disciplinary discharge planning process, led by the attending physician.  Recommendations may be updated based on patient status, additional functional criteria and insurance authorization.  Follow Up Recommendations  Other (comment) (TBD based on where he can d/c)     Assistance Recommended at Discharge Intermittent Supervision/Assistance  Patient can return home with the following A little help with walking and/or transfers;A little help with bathing/dressing/bathroom;Assist for transportation;Help with stairs or ramp for entrance;Assistance with cooking/housework   Equipment Recommendations  None recommended by PT    Recommendations for Other Services       Precautions / Restrictions Precautions Precautions: Fall Precaution Comments: R BKA, L  AKA Restrictions Weight Bearing Restrictions: No     Mobility  Bed Mobility Overal bed mobility: Modified Independent             General bed mobility comments: slightly increased time and use of bedrails with HOB elevated    Transfers Overall transfer level: Needs assistance Equipment used: Rolling walker (2 wheels) Transfers: Bed to chair/wheelchair/BSC, Sit to/from Stand Sit to Stand: Supervision   Step pivot transfers: Min guard       General transfer comment: standing from EOB x10, hop to on RLE with RW to pivot to recliner and center in front, no physical assist needed, close min guard for safety, pt asking for no hands on assist    Ambulation/Gait               General Gait Details: did not don L prosthesis for ambulation 2/2 LLE wound   Stairs             Wheelchair Mobility    Modified Rankin (Stroke Patients Only)       Balance Overall balance assessment: Mild deficits observed, not formally tested                                          Cognition Arousal/Alertness: Awake/alert Behavior During Therapy: Impulsive Overall Cognitive Status: No family/caregiver present to determine baseline cognitive functioning Area of Impairment: Safety/judgement                         Safety/Judgement: Decreased awareness of deficits     General Comments:  some lack of insight into deficits        Exercises Amputee Exercises Hip Extension: AROM, Left, 10 reps, Standing Hip Flexion/Marching: AROM, Left, 10 reps, Standing Other Exercises Other Exercises: sit<>stand 10x from EOB    General Comments General comments (skin integrity, edema, etc.): VSS on RA      Pertinent Vitals/Pain Pain Assessment Pain Assessment: No/denies pain    Home Living                          Prior Function            PT Goals (current goals can now be found in the care plan section) Acute Rehab PT Goals Patient  Stated Goal: walk and work PT Goal Formulation: With patient Time For Goal Achievement: 10/27/21    Frequency    Min 3X/week      PT Plan      Co-evaluation              AM-PAC PT "6 Clicks" Mobility   Outcome Measure  Help needed turning from your back to your side while in a flat bed without using bedrails?: None Help needed moving from lying on your back to sitting on the side of a flat bed without using bedrails?: None Help needed moving to and from a bed to a chair (including a wheelchair)?: A Little Help needed standing up from a chair using your arms (e.g., wheelchair or bedside chair)?: A Little Help needed to walk in hospital room?: Total Help needed climbing 3-5 steps with a railing? : Total 6 Click Score: 16    End of Session   Activity Tolerance: Patient tolerated treatment well Patient left: in chair;with call bell/phone within reach;with chair alarm set Nurse Communication: Mobility status PT Visit Diagnosis: Difficulty in walking, not elsewhere classified (R26.2)     Time: 6962-9528 PT Time Calculation (min) (ACUTE ONLY): 21 min  Charges:  $Therapeutic Activity: 8-22 mins                     Monisha Siebel R. PTA Acute Rehabilitation Services Office: [place] 10/16/2021, 10:59 AM

## 2021-10-16 NOTE — Progress Notes (Signed)
Inpatient Diabetes Program Recommendations  AACE/ADA: New Consensus Statement on Inpatient Glycemic Control (2015)  Target Ranges:  Prepandial:   less than 140 mg/dL      Peak postprandial:   less than 180 mg/dL (1-2 hours)      Critically ill patients:  140 - 180 mg/dL   Lab Results  Component Value Date   GLUCAP 271 (H) 10/16/2021   HGBA1C 6.0 (H) 08/17/2021    Review of Glycemic Control  Latest Reference Range & Units 10/14/21 21:27 10/15/21 08:22 10/15/21 11:38 10/15/21 16:11 10/15/21 21:30 10/16/21 07:55  Glucose-Capillary 70 - 99 mg/dL 172 (H) 196 (H) 236 (H) 256 (H) 341 (H) 271 (H)   Diabetes history:  Outpatient Diabetes medications: Humalog 10 units tid with meals, Lantus 10 units daily, Glucophage 1000 mg bid Current orders for Inpatient glycemic control:  Novolog 0-9 units tid with meals and HS Novolog 3 units tid with meals Semglee 12 units daily  Inpatient Diabetes Program Recommendations:    Consider increasing Novolog to 6 units tid with meals. Also consider increasing Semglee to 15 units daily.   Thanks,  Adah Perl, RN, BC-ADM Inpatient Diabetes Coordinator Pager [phone]  (8a-5p)

## 2021-10-16 NOTE — Progress Notes (Signed)
[name], male   DOB: [birth year], 56 y.o.   MRN: [id number] [facility] Progress Note   Assessment/ Plan:   1.  Acute kidney injury on chronic kidney disease stage IV:  Baseline Cr 2.8-2.4.  Appears to have been hemodynamically mediated in the setting of hyperglycemia and increased insensible losses while outdoors with the increased ambient temperatures.  Suspect that he likely developed mild ischemic ATN from sustained prerenal state.  Renal function stable/plateaued with Cr in mid 4s currently - 4.4 from 4.5 yesterday to today.  He remains nonoliguric and does not have any acute electrolyte abnormalities to prompt dialytic intervention - K can be managed medically.  F/B Dr. Cira Servant. 2.  Klebsiella urinary tract infection: No markers of sepsis noted, ongoing treatment with intravenous ceftriaxone. 3.  Hyperglycemia: With background history of poorly controlled type 2 diabetes mellitus and likely exacerbated by recent UTI.  Glycemic management per primary service. 4. Metabolic acidosis: This appears to be largely from his acute kidney injury, continue sodium bicarbonate. Improving.  5.  Hyponatremia: Resolved with treatment for hyperglycemia.  6.  Hyperkalemia:  K 5.5 this AM. Lokelma. Renal diet  If renal function stable/improved and K ok tomorrow he'd be ok for d/c from my perspective with close nephrology f/u.   Subjective:   Feeling fine.  No new issues.  UOP 2.4L yest.  K 5.5   Objective:   BP 135/81   Pulse 68   Temp 97.6 F (36.4 C) (Oral)   Resp 14   Ht 5\' 10"  (1.778 m)   Wt 125.2 kg   SpO2 93%   BMI 39.60 kg/m   Intake/Output Summary (Last 24 hours) at 10/16/2021 1100 Last data filed at 10/16/2021 4315 Gross per 24 hour  Intake --  Output 2930 ml  Net -2930 ml    Weight change: 2.7 kg  Physical Exam: Gen: Comfortably in chair CVS: Pulse regular rhythm, normal rate, S1 and S2 normal Resp: Clear to auscultation bilaterally, no  rales/rhonchi Abd: Soft, obese, nontender, bowel sounds normal Ext: Status post right BKA, left above-knee amputation with intact dressing over stump site, L AKA  Imaging: No results found.  Labs: BMET Recent Labs  Lab 10/10/21 0434 10/11/21 0230 10/12/21 0316 10/13/21 0236 10/14/21 0117 10/15/21 0146 10/16/21 0423  NA 133* 134* 134* 135 132* 136 136  K 4.3 4.8 4.6 4.8 5.0 5.2* 5.5*  CL 107 103 104 104 102 106 106  CO2 19* 18* 19* 20* 20* 20* 21*  GLUCOSE 382* 265* 230* 247* 293* 225* 293*  BUN 46* 45* 49* 52* 59* 66* 68*  CREATININE 3.62* 3.65* 4.02* 4.44* 4.56* 4.53* 4.39*  CALCIUM 8.1* 8.8* 8.8* 8.9 9.0 8.7* 8.8*  PHOS  --   --   --  4.0 4.1 3.7 3.8    CBC Recent Labs  Lab 10/10/21 0434 10/11/21 0230 10/15/21 0146  WBC 6.7 6.4 9.9  NEUTROABS  --  4.1 7.4  HGB 10.9* 11.2* 10.4*  HCT 34.1* 35.5* 32.7*  MCV 89.7 92.0 90.6  PLT 240 257 267     Medications:     atorvastatin  80 mg Oral Daily   carvedilol  3.125 mg Oral BID WC   dapsone  100 mg Oral Daily   dolutegravir-lamiVUDine  1 tablet Oral Daily   doravirine  100 mg Oral Daily   ferrous sulfate  325 mg Oral Q M,W,F-1800   finasteride  5 mg Oral Daily   gabapentin  100  mg Oral TID   heparin injection (subcutaneous)  5,000 Units Subcutaneous Q8H   icosapent Ethyl  2 g Oral BID   insulin aspart  0-5 Units Subcutaneous QHS   insulin aspart  0-9 Units Subcutaneous TID WC   insulin aspart  3 Units Subcutaneous TID WC   insulin glargine-yfgn  12 Units Subcutaneous Daily   mupirocin cream   Topical Daily   pantoprazole  40 mg Oral Daily   senna  2 tablet Oral QHS   sodium bicarbonate  650 mg Oral BID   sodium zirconium cyclosilicate  10 g Oral BID  Jannifer Hick MD [facility] Pager [phone]

## 2021-10-17 DIAGNOSIS — N179 Acute kidney failure, unspecified: Secondary | ICD-10-CM | POA: Diagnosis not present

## 2021-10-17 DIAGNOSIS — B20 Human immunodeficiency virus [HIV] disease: Secondary | ICD-10-CM | POA: Diagnosis not present

## 2021-10-17 DIAGNOSIS — K219 Gastro-esophageal reflux disease without esophagitis: Secondary | ICD-10-CM | POA: Diagnosis not present

## 2021-10-17 DIAGNOSIS — R739 Hyperglycemia, unspecified: Secondary | ICD-10-CM | POA: Diagnosis not present

## 2021-10-17 LAB — CBC WITH DIFFERENTIAL/PLATELET
Abs Immature Granulocytes: 0.08 10*3/uL — ABNORMAL HIGH (ref 0.00–0.07)
Basophils Absolute: 0.1 10*3/uL (ref 0.0–0.1)
Basophils Relative: 1 %
Eosinophils Absolute: 0.3 10*3/uL (ref 0.0–0.5)
Eosinophils Relative: 4 %
HCT: 33.1 % — ABNORMAL LOW (ref 39.0–52.0)
Hemoglobin: 10.8 g/dL — ABNORMAL LOW (ref 13.0–17.0)
Immature Granulocytes: 1 %
Lymphocytes Relative: 22 %
Lymphs Abs: 1.5 10*3/uL (ref 0.7–4.0)
MCH: 29.6 pg (ref 26.0–34.0)
MCHC: 32.6 g/dL (ref 30.0–36.0)
MCV: 90.7 fL (ref 80.0–100.0)
Monocytes Absolute: 0.6 10*3/uL (ref 0.1–1.0)
Monocytes Relative: 9 %
Neutro Abs: 4.4 10*3/uL (ref 1.7–7.7)
Neutrophils Relative %: 63 %
Platelets: 262 10*3/uL (ref 150–400)
RBC: 3.65 MIL/uL — ABNORMAL LOW (ref 4.22–5.81)
RDW: 12.9 % (ref 11.5–15.5)
WBC: 6.9 10*3/uL (ref 4.0–10.5)
nRBC: 0 % (ref 0.0–0.2)

## 2021-10-17 LAB — GLUCOSE, CAPILLARY
Glucose-Capillary: 246 mg/dL — ABNORMAL HIGH (ref 70–99)
Glucose-Capillary: 184 mg/dL — ABNORMAL HIGH (ref 70–99)
Glucose-Capillary: 153 mg/dL — ABNORMAL HIGH (ref 70–99)
Glucose-Capillary: 248 mg/dL — ABNORMAL HIGH (ref 70–99)

## 2021-10-17 LAB — BASIC METABOLIC PANEL
Anion gap: 9 (ref 5–15)
BUN: 60 mg/dL — ABNORMAL HIGH (ref 6–20)
CO2: 21 mmol/L — ABNORMAL LOW (ref 22–32)
Calcium: 8.8 mg/dL — ABNORMAL LOW (ref 8.9–10.3)
Chloride: 106 mmol/L (ref 98–111)
Creatinine, Ser: 3.84 mg/dL — ABNORMAL HIGH (ref 0.61–1.24)
GFR, Estimated: 18 mL/min — ABNORMAL LOW (ref >60)
Glucose, Bld: 253 mg/dL — ABNORMAL HIGH (ref 70–99)
Potassium: 4.7 mmol/L (ref 3.5–5.1)
Sodium: 136 mmol/L (ref 135–145)

## 2021-10-17 NOTE — Progress Notes (Addendum)
[name], male   DOB: Jun 18, 1965, 56 y.o.   MRN: [id number] [facility] Progress Note   Assessment/ Plan:   1.  Acute kidney injury on chronic kidney disease stage IV:  Baseline Cr 2.8-2.4.  Appears to have been hemodynamically mediated in the setting of hyperglycemia and increased insensible losses while outdoors with the increased ambient temperatures.  Suspect that he likely developed mild ischemic ATN from sustained prerenal state.  Renal function peaked at Cr 4.6 and is now improving to 3.8 today.   He remains nonoliguric and has not have any acute electrolyte abnormalities to prompt dialytic intervention.   F/B CKA. 2.  Klebsiella urinary tract infection: No markers of sepsis noted, ongoing treatment with intravenous ceftriaxone. 3.  Hyperglycemia: With background history of poorly controlled type 2 diabetes mellitus and likely exacerbated by recent UTI.  Glycemic management per primary service. 4. Metabolic acidosis: This appears to be largely from his acute kidney injury, continue sodium bicarbonate.  5.  Hyponatremia: Resolved with treatment for hyperglycemia.  6.  Hyperkalemia:  Resolved. Renal diet on d/c - discussed. 7.  HTN: variable BPs 110-160s in past 24h, given lows would avoid titration of meds at this time.   [place] for d/c from nephrology perspective. I'll arrange f/u in clinic in 2-4 weeks.  Nothing further to add - will s/o, call with concerns.   Subjective:   Feeling fine.  No new issues.  UOP 2.3L yest.  Cr improved to 3.8 today.    Objective:   BP (!) 166/85 (BP Location: Right Arm)   Pulse 65   Temp 97.6 F (36.4 C) (Oral)   Resp 14   Ht 5\' 10"  (1.778 m)   Wt 126.1 kg   SpO2 98%   BMI 39.89 kg/m   Intake/Output Summary (Last 24 hours) at 10/17/2021 0831 Last data filed at 10/17/2021 0532 Gross per 24 hour  Intake --  Output 1750 ml  Net -1750 ml    Weight change: 0.9 kg  Physical Exam: Gen: Comfortably in chair CVS: Pulse  regular rhythm, normal rate, S1 and S2 normal Resp: Clear to auscultation bilaterally, no rales/rhonchi Abd: Soft, obese, nontender, bowel sounds normal Ext: Status post right BKA, left above-knee amputation with intact dressing over stump site, L AKA  Imaging: No results found.  Labs: BMET Recent Labs  Lab 10/11/21 0230 10/12/21 0316 10/13/21 0236 10/14/21 0117 10/15/21 0146 10/16/21 0423 10/17/21 0350  NA 134* 134* 135 132* 136 136 136  K 4.8 4.6 4.8 5.0 5.2* 5.5* 4.7  CL 103 104 104 102 106 106 106  CO2 18* 19* 20* 20* 20* 21* 21*  GLUCOSE 265* 230* 247* 293* 225* 293* 253*  BUN 45* 49* 52* 59* 66* 68* 60*  CREATININE 3.65* 4.02* 4.44* 4.56* 4.53* 4.39* 3.84*  CALCIUM 8.8* 8.8* 8.9 9.0 8.7* 8.8* 8.8*  PHOS  --   --  4.0 4.1 3.7 3.8  --     CBC Recent Labs  Lab 10/11/21 0230 10/15/21 0146 10/17/21 0350  WBC 6.4 9.9 6.9  NEUTROABS 4.1 7.4 4.4  HGB 11.2* 10.4* 10.8*  HCT 35.5* 32.7* 33.1*  MCV 92.0 90.6 90.7  PLT 257 267 262     Medications:     atorvastatin  80 mg Oral Daily   carvedilol  3.125 mg Oral BID WC   dapsone  100 mg Oral Daily   dolutegravir-lamiVUDine  1 tablet Oral Daily   doravirine  100 mg Oral Daily  ferrous sulfate  325 mg Oral Q M,W,F-1800   finasteride  5 mg Oral Daily   gabapentin  100 mg Oral TID   heparin injection (subcutaneous)  5,000 Units Subcutaneous Q8H   icosapent Ethyl  2 g Oral BID   insulin aspart  0-5 Units Subcutaneous QHS   insulin aspart  0-9 Units Subcutaneous TID WC   insulin aspart  5 Units Subcutaneous TID WC   insulin glargine-yfgn  15 Units Subcutaneous Daily   mupirocin cream   Topical Daily   pantoprazole  40 mg Oral Daily   senna  2 tablet Oral QHS   sodium bicarbonate  650 mg Oral BID  Jannifer Hick MD [facility] Pager [phone]

## 2021-10-17 NOTE — TOC Progression Note (Signed)
Transition of Care [facility]) - Progression Note    Patient Details  Name: KENTON FORTIN MRN: [id number] Date of Birth: 16-Jan-1966  Transition of Care [facility]) CM/SW [place], [place] Phone Number: 10/17/2021, 2:26 PM  Clinical Narrative:     CSW spoke with patient and updated patient no ALF bed offers. Patient agreeable for CSW to fax him out for LTC SNF bed. CSW faxed out referral for possible LTC SNF bed for patient. CSW will continue to follow and assist with patients dc planning needs.  Expected Discharge Plan: Homeless Shelter Barriers to Discharge: Continued Medical Work up (Homeless)  Expected Discharge Plan and Services Expected Discharge Plan: Homeless Shelter In-house Referral: Clinical Social Work Discharge Planning Services: CM Consult Post Acute Care Choice: NA Living arrangements for the past 2 months: No permanent address (trying to seek out boarding homes.)                                       Social Determinants of Health (SDOH) Interventions    Readmission Risk Interventions    10/10/2021    1:31 PM 10/21/2020   11:53 AM 08/31/2020    2:30 PM  Readmission Risk Prevention Plan  Transportation Screening Complete Complete   PCP or Specialist Appt within 3-5 Days     HRI or [place] Work Consult for [place] Planning/Counseling     Palliative Care Screening     Medication Review Press photographer) Complete Complete   PCP or Specialist appointment within 3-5 days of discharge Complete Complete   HRI or [place] Complete Complete   SW Recovery Care/Counseling Consult Complete Complete   Palliative Care Screening Not Applicable Not [place] Not Applicable Complete      Information is confidential and restricted. Go to Review Flowsheets to unlock data.

## 2021-10-17 NOTE — Progress Notes (Signed)
[facility]                                                                               Keith [name], is a 56 y.o. male, DOB - 12-30-1965, EQA:[id number] Admit date - 10/08/2021    Outpatient Primary MD for the patient is [name], NP  LOS - 9  days    Brief summary   Keith [name] is a 56 y.o. male with past medical history of HIV/ AIDS on antiretroviral therapy, hypertension, hyperlipidemia, stage IV CKD with baseline creatinine of 2.8-3.4, type 2 diabetes mellitus, anemia of chronic disease, BPH presented to hospital with elevated blood glucose level.  Patient stated that he was spending his time outside for last 2 to 3 days and glucometer was reading high.  He reported good compliance with basal insulin but not with mealtime insulin.  Patient was hospitalized in May 2023 for hyperglycemia.  In the ED, vitals were stable.  Blood glucose level was more than 600.  Sodium was 123 and corrected to 133.  Anion gap 11.  CBC showed normal WBC count. Patient received LR bolus 2.5 L and insulin drip and was admitted to hospital for hyperglycemia and AKI.      Assessment & Plan    Assessment and Plan:    Hyperglycemia, uncontrolled type 2 diabetes.   Non compliant to meds.  CBG (last 3)  Recent Labs    10/16/21 0755 10/16/21 1306 10/16/21 1625  GLUCAP 271* 252* 232*   Increased semglee to 15 units daily. Increased novolog to 5 units TIDAC.  Last A1c is 6%.     Acute Kidney Injury on CKD 4 with bilateral mild hydronephrosis  Baseline creatinine appears to be at 3.3. suspect a component of urinary retention.  Creatinine at 3.8 today and improving.  Avoid nephrotoxins, dehydration and hypotension.  Good urine output.   HIV/AIDS: Most recent CD4 count noted to be 129 on 08/17/2021.   Resume anti viral treatment.   Klebsiella UTI present on admission:  Rocephin changed to Meropenam.  Recommend another 3 days of IV meropenam and plan for discharge then.     Essential Hypertension:  On Coreg, Norvasc and lisinopril as outpatient.   Holding lisinopril for AKI.    Hyperlipidemia: Continue statin and Vascepa.    GERD: Continue Protonix   Anemia of chronic disease:  Hemoglobin of 10-12.  We will continue to monitor.    Benign Prostatic Hyperplasia: Continue finasteride. Does not take flomax due to sulfa allergy.   Left stump wound with leg pain.  Patient takes Percocet at home.   Pain control.    MRSA colonization:   Hyperkalemia:  S/p lokelma.  Repeat potassium wnl.   Homelessness Bilateral amputee with ill fitting prosthesis , stump wound.  CSW on board.        Estimated body mass index is 39.89 kg/m as calculated from the following:   Height as of this encounter: 5\' 10"  (1.778 m).   Weight as of this encounter: 126.1 kg.  Code Status: FULL CODE.  DVT Prophylaxis:  heparin injection 5,000 Units Start: 10/09/21 1400   Level of Care: Level of care: Progressive  Family Communication: nONE AT BEDSIDE.   Disposition Plan:     Remains inpatient appropriate:  HOMELESSNESS.   Procedures:  NONE.   Consultants:   Nephrology.   Antimicrobials:   Anti-infectives (From admission, onward)    Start     Dose/Rate Route Frequency Ordered Stop   10/16/21 1730  meropenem (MERREM) 1 g in sodium chloride 0.9 % 100 mL IVPB        1 g 200 mL/hr over 30 Minutes Intravenous Every 12 hours 10/16/21 1634     10/12/21 0945  cefTRIAXone (ROCEPHIN) 1 g in sodium chloride 0.9 % 100 mL IVPB  Status:  Discontinued        1 g 200 mL/hr over 30 Minutes Intravenous Every 24 hours 10/12/21 0859 10/16/21 1634   10/09/21 1000  dapsone tablet 100 mg        100 mg Oral Daily 10/09/21 0129     10/09/21 1000  dolutegravir-lamiVUDine (DOVATO) 50-300 MG per tablet 1 tablet        1 tablet Oral Daily 10/09/21 0129     10/09/21 1000  doravirine (PIFELTRO) tablet 100 mg        100 mg Oral Daily 10/09/21 0129          Medications  Scheduled  Meds:  atorvastatin  80 mg Oral Daily   carvedilol  3.125 mg Oral BID WC   dapsone  100 mg Oral Daily   dolutegravir-lamiVUDine  1 tablet Oral Daily   doravirine  100 mg Oral Daily   ferrous sulfate  325 mg Oral Q M,W,F-1800   finasteride  5 mg Oral Daily   gabapentin  100 mg Oral TID   heparin injection (subcutaneous)  5,000 Units Subcutaneous Q8H   icosapent Ethyl  2 g Oral BID   insulin aspart  0-5 Units Subcutaneous QHS   insulin aspart  0-9 Units Subcutaneous TID WC   insulin aspart  5 Units Subcutaneous TID WC   insulin glargine-yfgn  15 Units Subcutaneous Daily   mupirocin cream   Topical Daily   pantoprazole  40 mg Oral Daily   senna  2 tablet Oral QHS   sodium bicarbonate  650 mg Oral BID   Continuous Infusions:  meropenem (MERREM) IV 1 g (10/17/21 0532)   PRN Meds:.acetaminophen **OR** acetaminophen, dextrose, naLOXone (NARCAN)  injection, ondansetron (ZOFRAN) IV, mouth rinse, oxyCODONE-acetaminophen **AND** oxyCODONE    Subjective:   Keith [name] was seen and examined today.  NO chest pain or sob, no nausea or vomiting.   Objective:   Vitals:   10/16/21 1555 10/16/21 2205 10/17/21 0356 10/17/21 0838  BP: (!) 140/101 111/72 (!) 166/85 (!) 141/93  Pulse: 73 66 65 75  Resp: 18 15 14 19   Temp: 97.7 F (36.5 C) 97.7 F (36.5 C) 97.6 F (36.4 C) 97.8 F (36.6 C)  TempSrc: Oral Axillary Oral Oral  SpO2: 98% 98% 98% 98%  Weight:   126.1 kg   Height:        Intake/Output Summary (Last 24 hours) at 10/17/2021 1149 Last data filed at 10/17/2021 0532 Gross per 24 hour  Intake --  Output 1750 ml  Net -1750 ml    Filed Weights   10/15/21 0500 10/16/21 0432 10/17/21 0356  Weight: 122.5 kg 125.2 kg 126.1 kg     Exam General exam: Appears calm and comfortable  Respiratory system: Clear to auscultation. Respiratory effort normal. Cardiovascular system: S1 & S2 heard, RRR. No JVD,  No pedal edema. Gastrointestinal system:  Abdomen is nondistended, soft and  nontender. Normal bowel sounds heard. Central nervous system: Alert and oriented. No focal neurological deficits. Extremities: bilateral amputee.  Skin: No rashes Psychiatry: Mood & affect appropriate.    Data Reviewed:  I have personally reviewed following labs and imaging studies   CBC Lab Results  Component Value Date   WBC 6.9 10/17/2021   RBC 3.65 (L) 10/17/2021   HGB 10.8 (L) 10/17/2021   HCT 33.1 (L) 10/17/2021   MCV 90.7 10/17/2021   MCH 29.6 10/17/2021   PLT 262 10/17/2021   MCHC 32.6 10/17/2021   RDW 12.9 10/17/2021   LYMPHSABS 1.5 10/17/2021   MONOABS 0.6 10/17/2021   EOSABS 0.3 10/17/2021   BASOSABS 0.1 03/27/7587     Last metabolic panel Lab Results  Component Value Date   NA 136 10/17/2021   K 4.7 10/17/2021   CL 106 10/17/2021   CO2 21 (L) 10/17/2021   BUN 60 (H) 10/17/2021   CREATININE 3.84 (H) 10/17/2021   GLUCOSE 253 (H) 10/17/2021   GFRNONAA 18 (L) 10/17/2021   GFRAA 35 (L) 08/23/2019   CALCIUM 8.8 (L) 10/17/2021   PHOS 3.8 10/16/2021   PROT 6.7 10/11/2021   ALBUMIN 2.6 (L) 10/16/2021   LABGLOB 4.1 09/07/2018   AGRATIO 0.9 (L) 09/07/2018   BILITOT 0.4 10/11/2021   ALKPHOS 96 10/11/2021   AST 15 10/11/2021   ALT 11 10/11/2021   ANIONGAP 9 10/17/2021    CBG (last 3)  Recent Labs    10/16/21 1625 10/16/21 2200 10/17/21 0828  GLUCAP 232* 277* 246*       Coagulation Profile: No results for input(s): "INR", "PROTIME" in the last 168 hours.   Radiology Studies: No results found.     Hosie Poisson M.D. [facility] 10/17/2021, 11:49 AM  Available via Epic secure chat 7am-7pm After 7 pm, please refer to night coverage provider listed on amion.

## 2021-10-17 NOTE — Progress Notes (Signed)
Mobility Specialist Progress Note    10/17/21 1021  Mobility  Activity Transferred from bed to chair  Level of Assistance Contact guard assist, steadying assist  Assistive Device Other (Comment) (HHA)  Activity Response Tolerated well  $Mobility charge 1 Mobility   Pt received and agreeable. No complaints. Left with call bell in reach.    Hildred Alamin Mobility Specialist

## 2021-10-18 DIAGNOSIS — N179 Acute kidney failure, unspecified: Secondary | ICD-10-CM | POA: Diagnosis not present

## 2021-10-18 DIAGNOSIS — K219 Gastro-esophageal reflux disease without esophagitis: Secondary | ICD-10-CM | POA: Diagnosis not present

## 2021-10-18 DIAGNOSIS — R739 Hyperglycemia, unspecified: Secondary | ICD-10-CM | POA: Diagnosis not present

## 2021-10-18 DIAGNOSIS — B20 Human immunodeficiency virus [HIV] disease: Secondary | ICD-10-CM | POA: Diagnosis not present

## 2021-10-18 LAB — GLUCOSE, CAPILLARY
Glucose-Capillary: 158 mg/dL — ABNORMAL HIGH (ref 70–99)
Glucose-Capillary: 147 mg/dL — ABNORMAL HIGH (ref 70–99)
Glucose-Capillary: 99 mg/dL (ref 70–99)
Glucose-Capillary: 246 mg/dL — ABNORMAL HIGH (ref 70–99)

## 2021-10-18 MED ORDER — INSULIN ASPART 100 UNIT/ML IJ SOLN
0.0000 [IU] | Freq: Three times a day (TID) | INTRAMUSCULAR | Status: DC
  Administered 2021-10-19: 5 [IU] via SUBCUTANEOUS
  Administered 2021-10-19: 3 [IU] via SUBCUTANEOUS

## 2021-10-18 MED ORDER — INSULIN GLARGINE-YFGN 100 UNIT/ML ~~LOC~~ SOLN: 18.0000 [IU] | Freq: Every day | SUBCUTANEOUS | Status: DC

## 2021-10-18 MED ORDER — INSULIN GLARGINE-YFGN 100 UNIT/ML ~~LOC~~ SOLN
20.0000 [IU] | Freq: Every day | SUBCUTANEOUS | Status: DC
  Administered 2021-10-19: 20 [IU] via SUBCUTANEOUS
  Filled 2021-10-18 (×2): qty 0.2

## 2021-10-18 NOTE — Care Management Important Message (Signed)
Important Message  Patient Details  Name: Keith [name] MRN: [id number] Date of Birth: 07-10-1965   Medicare Important Message Given:  Yes     Shelda Altes 10/18/2021, 9:06 AM

## 2021-10-18 NOTE — TOC Progression Note (Addendum)
Transition of Care [facility]) - Progression Note    Patient Details  Name: AMAR SIPPEL MRN: [id number] Date of Birth: 04/03/1966  Transition of Care [facility]) CM/SW [place], [place] Phone Number: 10/18/2021, 8:53 AM  Clinical Narrative:     Update- Tonya with [facility] confirmed she can accept patient for LTC placement. CSW offered patient LTC SNF bed to patient. Patient accepted. Patient would like some short term rehab there when he dc's. CSW informed MD. PT to see patient.CSW will continue to follow.  Update- Tonya with [facility] met with patient today. Tonya informed CSW she is waiting on medication and financial approval for possible LTC placement for patient. CSW informed MD.    CSW spoke with [place] from [facility]. Kenney Houseman is going to come by today to see patient to determine if she can offer LTC SNF bed for patient. CSW awaiting determination. CSW will continue to follow and assist with patients dc planning needs.  Expected Discharge Plan: Homeless Shelter Barriers to Discharge: Continued Medical Work up (Homeless)  Expected Discharge Plan and Services Expected Discharge Plan: Homeless Shelter In-house Referral: Clinical Social Work Discharge Planning Services: CM Consult Post Acute Care Choice: NA Living arrangements for the past 2 months: No permanent address (trying to seek out boarding homes.)                                       Social Determinants of Health (SDOH) Interventions    Readmission Risk Interventions    10/10/2021    1:31 PM 10/21/2020   11:53 AM 08/31/2020    2:30 PM  Readmission Risk Prevention Plan  Transportation Screening Complete Complete   PCP or Specialist Appt within 3-5 Days     HRI or [place] Work Consult for [place] Planning/Counseling     Palliative Care Screening     Medication Review Press photographer) Complete Complete   PCP or Specialist appointment within 3-5 days of discharge Complete Complete   HRI or  [place] Complete Complete   SW Recovery Care/Counseling Consult Complete Complete   Palliative Care Screening Not Applicable Not [place] Not Applicable Complete      Information is confidential and restricted. Go to Review Flowsheets to unlock data.

## 2021-10-18 NOTE — Plan of Care (Signed)

## 2021-10-18 NOTE — Progress Notes (Signed)
[facility]                                                                               Keith [name], is a 56 y.o. male, DOB - Aug 01, 1965, AQT:[id number] Admit date - 10/08/2021    Outpatient Primary MD for the patient is Keith Francois, NP  LOS - 10  days    Brief summary   Keith [name] is a 56 y.o. male with past medical history of HIV/ AIDS on antiretroviral therapy, hypertension, hyperlipidemia, stage IV CKD with baseline creatinine of 2.8-3.4, type 2 diabetes mellitus, anemia of chronic disease, BPH presented to hospital with elevated blood glucose level.  Patient stated that he was spending his time outside for last 2 to 3 days and glucometer was reading high.  He reported good compliance with basal insulin but not with mealtime insulin.  Patient was hospitalized in May 2023 for hyperglycemia.  In the ED, vitals were stable.  Blood glucose level was more than 600.  Sodium was 123 and corrected to 133.  Anion gap 11.  CBC showed normal WBC count. Patient received LR bolus 2.5 L and insulin drip and was admitted to hospital for hyperglycemia and AKI. Therapy eval recommending SNF.       Assessment & Plan    Assessment and Plan:    Uncontrolled type 2 DM, with hyperglycemia, insulin dependent.  Non compliant to meds.  CBG (last 3)  Recent Labs    10/16/21 0755 10/16/21 1306 10/16/21 1625  GLUCAP 271* 252* 232*   Increased semglee to 20 units daily. Increased novolog to 5 units TIDAC. Changed to moderate SSI.  Last A1c is 6%.     Acute Kidney Injury on CKD 4 with bilateral mild hydronephrosis  Baseline creatinine appears to be at 3.3. suspect a component of urinary retention.  Creatinine at 3.8 yesterday , repeat labs tomorrow.  Avoid nephrotoxins, dehydration and hypotension.  Good urine output.   HIV/AIDS: Most recent CD4 count noted to be 129 on 08/17/2021.   Resume anti viral treatment.   Klebsiella UTI present on admission:  Rocephin changed  to Meropenam.  Complete 5 days of Meropenam and plan for discharge to SNF.    Essential Hypertension:  Bp parameters are optimal.  On Coreg, Norvasc and lisinopril as outpatient.   Holding lisinopril for AKI.    Hyperlipidemia: Continue statin and Vascepa.    GERD: Continue Protonix   Anemia of chronic disease:  Hemoglobin of 10-12.  We will continue to monitor.    Benign Prostatic Hyperplasia: Continue finasteride. Does not take flomax due to sulfa allergy.   Left stump wound with leg pain.  Patient takes Percocet at home.   Pain control.    MRSA colonization:   Hyperkalemia:  S/p lokelma.  Repeat potassium wnl.   Homelessness Bilateral amputee with ill fitting prosthesis , stump wound.  CSW on board.  Plan for SNF after the completion of antibiotics.       Estimated body mass index is 39.73 kg/m as calculated from the following:   Height as of this encounter: 5\' 10"  (1.778 m).   Weight as of this encounter: 125.6 kg.  Code Status: FULL CODE.  DVT Prophylaxis:  heparin injection 5,000 Units Start: 10/09/21 1400   Level of Care: Level of care: Progressive Family Communication: nONE AT BEDSIDE.   Disposition Plan:     Remains inpatient appropriate:  HOMELESSNESS.   Procedures:  NONE.   Consultants:   Nephrology.   Antimicrobials:   Anti-infectives (From admission, onward)    Start     Dose/Rate Route Frequency Ordered Stop   10/16/21 1730  meropenem (MERREM) 1 g in sodium chloride 0.9 % 100 mL IVPB        1 g 200 mL/hr over 30 Minutes Intravenous Every 12 hours 10/16/21 1634     10/12/21 0945  cefTRIAXone (ROCEPHIN) 1 g in sodium chloride 0.9 % 100 mL IVPB  Status:  Discontinued        1 g 200 mL/hr over 30 Minutes Intravenous Every 24 hours 10/12/21 0859 10/16/21 1634   10/09/21 1000  dapsone tablet 100 mg        100 mg Oral Daily 10/09/21 0129     10/09/21 1000  dolutegravir-lamiVUDine (DOVATO) 50-300 MG per tablet 1 tablet        1 tablet Oral  Daily 10/09/21 0129     10/09/21 1000  doravirine (PIFELTRO) tablet 100 mg        100 mg Oral Daily 10/09/21 0129          Medications  Scheduled Meds:  atorvastatin  80 mg Oral Daily   carvedilol  3.125 mg Oral BID WC   dapsone  100 mg Oral Daily   dolutegravir-lamiVUDine  1 tablet Oral Daily   doravirine  100 mg Oral Daily   ferrous sulfate  325 mg Oral Q M,W,F-1800   finasteride  5 mg Oral Daily   gabapentin  100 mg Oral TID   heparin injection (subcutaneous)  5,000 Units Subcutaneous Q8H   icosapent Ethyl  2 g Oral BID   insulin aspart  0-5 Units Subcutaneous QHS   insulin aspart  0-9 Units Subcutaneous TID WC   insulin aspart  5 Units Subcutaneous TID WC   insulin glargine-yfgn  15 Units Subcutaneous Daily   mupirocin cream   Topical Daily   pantoprazole  40 mg Oral Daily   senna  2 tablet Oral QHS   sodium bicarbonate  650 mg Oral BID   Continuous Infusions:  meropenem (MERREM) IV Stopped (10/18/21 0637)   PRN Meds:.acetaminophen **OR** acetaminophen, dextrose, naLOXone (NARCAN)  injection, ondansetron (ZOFRAN) IV, mouth rinse, oxyCODONE-acetaminophen **AND** oxyCODONE    Subjective:   [name] was seen and examined today.  No new complaints today.   Objective:   Vitals:   10/17/21 2055 10/18/21 0607 10/18/21 0847 10/18/21 1437  BP: 136/82 130/80 128/88 110/71  Pulse: 66 66 73 68  Resp: 18 16 12 19   Temp: 97.9 F (36.6 C) 97.6 F (36.4 C) 97.7 F (36.5 C) 97.7 F (36.5 C)  TempSrc: Oral Oral Oral Oral  SpO2:  97% 98%   Weight:  125.6 kg    Height:        Intake/Output Summary (Last 24 hours) at 10/18/2021 1637 Last data filed at 10/18/2021 1300 Gross per 24 hour  Intake 640 ml  Output 1525 ml  Net -885 ml    Filed Weights   10/16/21 0432 10/17/21 0356 10/18/21 0607  Weight: 125.2 kg 126.1 kg 125.6 kg     Exam General exam: Appears calm and comfortable  Respiratory system: Clear to auscultation. Respiratory effort  normal. Cardiovascular system: S1 & S2 heard, RRR. No JVD, . No pedal edema. Gastrointestinal system: Abdomen is nondistended, soft and nontender. Normal bowel sounds heard. Central nervous system: Alert and oriented. No focal neurological deficits. Extremities: Symmetric 5 x 5 power. Skin: No rashes, lesions or ulcers Psychiatry:  Mood & affect appropriate.     Data Reviewed:  I have personally reviewed following labs and imaging studies   CBC Lab Results  Component Value Date   WBC 6.9 10/17/2021   RBC 3.65 (L) 10/17/2021   HGB 10.8 (L) 10/17/2021   HCT 33.1 (L) 10/17/2021   MCV 90.7 10/17/2021   MCH 29.6 10/17/2021   PLT 262 10/17/2021   MCHC 32.6 10/17/2021   RDW 12.9 10/17/2021   LYMPHSABS 1.5 10/17/2021   MONOABS 0.6 10/17/2021   EOSABS 0.3 10/17/2021   BASOSABS 0.1 29/52/8413     Last metabolic panel Lab Results  Component Value Date   NA 136 10/17/2021   K 4.7 10/17/2021   CL 106 10/17/2021   CO2 21 (L) 10/17/2021   BUN 60 (H) 10/17/2021   CREATININE 3.84 (H) 10/17/2021   GLUCOSE 253 (H) 10/17/2021   GFRNONAA 18 (L) 10/17/2021   GFRAA 35 (L) 08/23/2019   CALCIUM 8.8 (L) 10/17/2021   PHOS 3.8 10/16/2021   PROT 6.7 10/11/2021   ALBUMIN 2.6 (L) 10/16/2021   LABGLOB 4.1 09/07/2018   AGRATIO 0.9 (L) 09/07/2018   BILITOT 0.4 10/11/2021   ALKPHOS 96 10/11/2021   AST 15 10/11/2021   ALT 11 10/11/2021   ANIONGAP 9 10/17/2021    CBG (last 3)  Recent Labs    10/17/21 2149 10/18/21 0855 10/18/21 1208  GLUCAP 248* 158* 147*       Coagulation Profile: No results for input(s): "INR", "PROTIME" in the last 168 hours.   Radiology Studies: No results found.     Hosie Poisson M.D. [facility] 10/18/2021, 4:37 PM  Available via Epic secure chat 7am-7pm After 7 pm, please refer to night coverage provider listed on amion.

## 2021-10-18 NOTE — Progress Notes (Signed)
PT Cancellation Note  Patient Details Name: Keith [name] MRN: [id number] DOB: Jan 21, 1966   Cancelled Treatment:    Reason Eval/Treat Not Completed: Patient declined, no reason specified   Shary Decamp [facility] 10/18/2021, 3:35 PM [place] Office [phone]

## 2021-10-18 NOTE — Progress Notes (Signed)
Mobility Specialist Progress Note    10/18/21 0958  Mobility  Activity Transferred from bed to chair  Level of Assistance Contact guard assist, steadying assist  Assistive Device Other (Comment) (HHA)  Activity Response Tolerated well  $Mobility charge 1 Mobility   Pt received and agreeable. No complaints. Left with alarm on and call bell in reach.   Hildred Alamin Mobility Specialist

## 2021-10-18 NOTE — Progress Notes (Signed)
Occupational Therapy Treatment Patient Details Name: Keith [name] MRN: [id number] DOB: [birth year] Today's Date: 10/18/2021   History of present illness Pt is 56 yo male who is homeless, presents [facility] on 10/08[facility]21 with hyperglycemia and volume contraction worsened by spending a lot of time outdoors. Has recent Klebsiella UTI.  PMHx:  R BKA, L AKA, AIDS, CVA, CAD, MI, HTN, CKD, DM2, PVD, CKD   OT comments  Pt bathing in recliner today with set-upA in sitting; when asked if he wanted to stand for pericare, pt reported fatigue. Improvements to his wound on LLE, but he was not ready to try to stand on it after bathing. Pt progressing. Pt would benefit from continued OT skilled services. OT following acutely.   Recommendations for follow up therapy are one component of a multi-disciplinary discharge planning process, led by the attending physician.  Recommendations may be updated based on patient status, additional functional criteria and insurance authorization.    Follow Up Recommendations  Skilled nursing-short term rehab (<3 hours/day)    Assistance Recommended at Discharge Intermittent Supervision/Assistance  Patient can return home with the following  Assist for transportation;Help with stairs or ramp for entrance;A little help with bathing/dressing/bathroom   Equipment Recommendations  None recommended by OT    Recommendations for Other Services      Precautions / Restrictions Precautions Precautions: Fall Precaution Comments: R BKA, L AKA Restrictions Weight Bearing Restrictions: No       Mobility Bed Mobility               General bed mobility comments: in recliner pre and post session    Transfers                   General transfer comment: sat in recliner     Balance                                           ADL either performed or assessed with clinical judgement   ADL Overall ADL's : Needs assistance/impaired         Upper  Body Bathing: Set up;Sitting   Lower Body Bathing: Set up;Sitting/lateral leans Lower Body Bathing Details (indicate cue type and reason): leaning side to side for pericare today Upper Body Dressing : Set up;Sitting                   Functional mobility during ADLs: Supervision/safety General ADL Comments: Pt bathing in recliner today; when asked if he wanted to stand for pericare, pt reported fatigue. Improvements to his wound on LLE, but he was not ready to try to stand on it after bathing.    Extremity/Trunk Assessment Upper Extremity Assessment Upper Extremity Assessment: Overall WFL for tasks assessed   Lower Extremity Assessment RLE Deficits / Details: R BKA, donning RLE prosthesis LLE Deficits / Details: L stump would healing; scabbed over        Vision   Vision Assessment?: No apparent visual deficits   Perception     Praxis      Cognition Arousal/Alertness: Awake/alert Behavior During Therapy: WFL for tasks assessed/performed Overall Cognitive Status: No family/caregiver present to determine baseline cognitive functioning Area of Impairment: Safety/judgement                         Safety/Judgement: Decreased awareness of deficits  General Comments: some lack of insight into deficits        Exercises      Shoulder Instructions       General Comments VSS on RA    Pertinent Vitals/ Pain       Pain Assessment Pain Assessment: No/denies pain  Home Living                                          Prior Functioning/Environment              Frequency  Min 2X/week        Progress Toward Goals  OT Goals(current goals can now be found in the care plan section)  Progress towards OT goals: Progressing toward goals  Acute Rehab OT Goals Patient Stated Goal: to walk OT Goal Formulation: With patient Time For Goal Achievement: 10/29/21 Potential to Achieve Goals: Good  Plan Discharge plan remains  appropriate    Co-evaluation                 AM-PAC OT "6 Clicks" Daily Activity     Outcome Measure   Help from another person eating meals?: None Help from another person taking care of personal grooming?: A Little Help from another person toileting, which includes using toliet, bedpan, or urinal?: A Lot Help from another person bathing (including washing, rinsing, drying)?: A Lot Help from another person to put on and taking off regular upper body clothing?: A Little Help from another person to put on and taking off regular lower body clothing?: A Lot 6 Click Score: 16    End of Session    OT Visit Diagnosis: Unsteadiness on feet (R26.81);History of falling (Z91.81)   Activity Tolerance Patient tolerated treatment well   Patient Left in chair;with call bell/phone within reach;with chair alarm set   Nurse Communication Mobility status        Time: 0938-1829 OT Time Calculation (min): 25 min  Charges: OT General Charges $OT Visit: 1 Visit OT Treatments $Self Care/Home Management : 8-22 mins $Therapeutic Activity: 8-22 mins  Jefferey Pica, OTR/L Acute Rehabilitation Services Office: [phone]   FYBOFBP Z WCHEN 10/18/2021, 2:06 PM

## 2021-10-19 DIAGNOSIS — N400 Enlarged prostate without lower urinary tract symptoms: Secondary | ICD-10-CM | POA: Diagnosis not present

## 2021-10-19 DIAGNOSIS — R739 Hyperglycemia, unspecified: Secondary | ICD-10-CM | POA: Diagnosis not present

## 2021-10-19 DIAGNOSIS — N179 Acute kidney failure, unspecified: Secondary | ICD-10-CM | POA: Diagnosis not present

## 2021-10-19 DIAGNOSIS — K219 Gastro-esophageal reflux disease without esophagitis: Secondary | ICD-10-CM | POA: Diagnosis not present

## 2021-10-19 LAB — CULTURE, BLOOD (ROUTINE X 2)
Culture: NO GROWTH
Culture: NO GROWTH
Special Requests: ADEQUATE
Special Requests: ADEQUATE

## 2021-10-19 LAB — BASIC METABOLIC PANEL
Anion gap: 14 (ref 5–15)
BUN: 62 mg/dL — ABNORMAL HIGH (ref 6–20)
CO2: 20 mmol/L — ABNORMAL LOW (ref 22–32)
Calcium: 8.8 mg/dL — ABNORMAL LOW (ref 8.9–10.3)
Chloride: 104 mmol/L (ref 98–111)
Creatinine, Ser: 4.18 mg/dL — ABNORMAL HIGH (ref 0.61–1.24)
GFR, Estimated: 16 mL/min — ABNORMAL LOW (ref >60)
Glucose, Bld: 176 mg/dL — ABNORMAL HIGH (ref 70–99)
Potassium: 4.8 mmol/L (ref 3.5–5.1)
Sodium: 138 mmol/L (ref 135–145)

## 2021-10-19 LAB — GLUCOSE, CAPILLARY
Glucose-Capillary: 189 mg/dL — ABNORMAL HIGH (ref 70–99)
Glucose-Capillary: 207 mg/dL — ABNORMAL HIGH (ref 70–99)
Glucose-Capillary: 114 mg/dL — ABNORMAL HIGH (ref 70–99)
Glucose-Capillary: 148 mg/dL — ABNORMAL HIGH (ref 70–99)

## 2021-10-19 MED ORDER — INSULIN GLARGINE-YFGN 100 UNIT/ML ~~LOC~~ SOLN
20.0000 [IU] | Freq: Every day | SUBCUTANEOUS | 11 refills | Status: DC
Start: 2021-10-20 — End: 2021-12-09

## 2021-10-19 MED ORDER — SENNA 8.6 MG PO TABS: 2.0000 | ORAL_TABLET | Freq: Every day | ORAL | 0 refills | Status: DC

## 2021-10-19 MED ORDER — MUPIROCIN CALCIUM 2 % EX CREA: TOPICAL_CREAM | Freq: Every day | CUTANEOUS | 0 refills | Status: DC

## 2021-10-19 MED ORDER — INSULIN ASPART 100 UNIT/ML IJ SOLN
INTRAMUSCULAR | 11 refills | Status: DC
Start: 2021-10-19 — End: 2021-12-15

## 2021-10-19 MED ORDER — LEVOFLOXACIN 750 MG PO TABS
750.0000 mg | ORAL_TABLET | Freq: Every day | ORAL | Status: DC
  Filled 2021-10-19: qty 1

## 2021-10-19 MED ORDER — FERROUS SULFATE 325 (65 FE) MG PO TABS: 325.0000 mg | ORAL_TABLET | ORAL | 0 refills | Status: DC

## 2021-10-19 MED ORDER — INSULIN ASPART 100 UNIT/ML IJ SOLN: 5.0000 [IU] | Freq: Three times a day (TID) | INTRAMUSCULAR | 11 refills | Status: DC

## 2021-10-19 MED ORDER — OXYCODONE-ACETAMINOPHEN 10-325 MG PO TABS: 1.0000 | ORAL_TABLET | ORAL | 0 refills | Status: AC | PRN

## 2021-10-19 MED ORDER — ESCITALOPRAM OXALATE 20 MG PO TABS: 20.0000 mg | ORAL_TABLET | Freq: Every day | ORAL | 3 refills | Status: DC

## 2021-10-19 MED ORDER — LEVOFLOXACIN 750 MG PO TABS
750.0000 mg | ORAL_TABLET | Freq: Every day | ORAL | Status: AC
  Administered 2021-10-19: 750 mg via ORAL
  Filled 2021-10-19: qty 1

## 2021-10-19 MED ORDER — GABAPENTIN 100 MG PO CAPS: 100.0000 mg | ORAL_CAPSULE | Freq: Three times a day (TID) | ORAL | 1 refills | Status: DC

## 2021-10-19 MED ORDER — PANTOPRAZOLE SODIUM 40 MG PO TBEC: 40.0000 mg | DELAYED_RELEASE_TABLET | Freq: Every day | ORAL | 0 refills | Status: DC

## 2021-10-19 MED ORDER — SODIUM BICARBONATE 650 MG PO TABS: 650.0000 mg | ORAL_TABLET | Freq: Two times a day (BID) | ORAL | 0 refills | Status: DC

## 2021-10-19 NOTE — Progress Notes (Signed)
Patient's clothing collected from car in hospital lot by security.  Patient does not know license plate number and security guard was not able to give me license number either.  Car being left in lot at this time until patient can arrange for someone to pick up.

## 2021-10-19 NOTE — TOC Progression Note (Addendum)
Transition of Care [facility]) - Progression Note    Patient Details  Name: Keith [name] MRN: [id number] Date of Birth: 04-Sep-1965  Transition of Care [facility]) CM/SW [place], [place] Phone Number: 10/19/2021, 11:41 AM  Clinical Narrative:     Update-2:15pm- Patients insurance authorization has been approved. Plan Josem Kaufmann ID# [id number] [place] ID# [id number] Insurance authorization approved from 7/14-7/18. CSW informed Tonya with [facility] SNF who confirmed patient can dc over today if medically ready. CSW informed MD.  CSW started insurance authorization for patient. Reference # [id number]. Patient has SNF bed at [facility]. Insurance authorization pending. Patient gave CSW permission to discuss his dc plan with his case worker Ms. Ross with [facility].CSW called patients caseworker with [facility] [place] and updated her on placement.CSW will continue to follow and assist with patients dc planning needs.  Expected Discharge Plan: Homeless Shelter Barriers to Discharge: Continued Medical Work up (Homeless)  Expected Discharge Plan and Services Expected Discharge Plan: Homeless Shelter In-house Referral: Clinical Social Work Discharge Planning Services: CM Consult Post Acute Care Choice: NA Living arrangements for the past 2 months: No permanent address (trying to seek out boarding homes.)                                       Social Determinants of Health (SDOH) Interventions    Readmission Risk Interventions    10/10/2021    1:31 PM 10/21/2020   11:53 AM 08/31/2020    2:30 PM  Readmission Risk Prevention Plan  Transportation Screening Complete Complete   PCP or Specialist Appt within 3-5 Days     HRI or [place] Work Consult for [place] Planning/Counseling     Palliative Care Screening     Medication Review Press photographer) Complete Complete   PCP or Specialist appointment within 3-5 days of  discharge Complete Complete   HRI or [place] Complete Complete   SW Recovery Care/Counseling Consult Complete Complete   Palliative Care Screening Not Applicable Not [place] Not Applicable Complete      Information is confidential and restricted. Go to Review Flowsheets to unlock data.

## 2021-10-19 NOTE — Discharge Summary (Signed)
Physician Discharge Summary   Patient: Keith [name] MRN: [id number] DOB: September 14, 1965  Admit date:     10/08/2021  Discharge date: 10/19/21  Discharge Physician: Hosie Poisson   [name], NP   Recommendations at discharge:  Please follow up with nephrology as recommended.  Please follow up with PCP in one week.   Discharge Diagnoses: Principal Problem:   Hyperglycemia Active Problems:   HTN (hypertension)   Hyperlipidemia   HIV disease (HCC)   Acute renal failure superimposed on stage 4 chronic kidney disease (HCC)   Anemia   BPH (benign prostatic hyperplasia)   GERD (gastroesophageal reflux disease)    Hospital Course:  Keith [name] is a 56 y.o. male with past medical history of HIV/ AIDS on antiretroviral therapy, hypertension, hyperlipidemia, stage IV CKD with baseline creatinine of 2.8-3.4, type 2 diabetes mellitus, anemia of chronic disease, BPH presented to hospital with elevated blood glucose level.  Patient stated that he was spending his time outside for last 2 to 3 days and glucometer was reading high.  He reported good compliance with basal insulin but not with mealtime insulin.  Patient was hospitalized in May 2023 for hyperglycemia.  In the ED, vitals were stable.  Blood glucose level was more than 600.  Sodium was 123 and corrected to 133.  Anion gap 11.  CBC showed normal WBC count. Patient received LR bolus 2.5 L and insulin drip and was admitted to hospital for hyperglycemia and AKI. Therapy eval recommending SNF.       Assessment and Plan: Uncontrolled type 2 DM, with hyperglycemia, insulin dependent.  Non compliant to meds.  CBG (last 3)       Recent Labs    10/16/21 0755 10/16/21 1306 10/16/21 1625  GLUCAP 271* 252* 232*    Increased semglee to 20 units daily. Increased novolog to 5 units TIDAC. Changed to moderate SSI.  Last A1c is 6%.  No changes to meds today. Monitor for another 24 hours before making changes.      Acute Kidney  Injury on CKD 4 with bilateral mild hydronephrosis  Baseline creatinine appears to be at 3.3. suspect a component of urinary retention.  Creatinine up at 4.1. continue to monitor.  Avoid nephrotoxins, dehydration and hypotension.  Good urine output.    HIV/AIDS: Most recent CD4 count noted to be 129 on 08/17/2021.   Resume anti viral treatment.     Klebsiella UTI present on admission:  Rocephin changed to Meropenam.  Complete 5 days of treatment.    Essential Hypertension:  Bp parameters are optimal. Will add hydralazine prn.  On Coreg, Norvasc and lisinopril as outpatient.   Holding lisinopril for AKI.      Hyperlipidemia: Continue statin and Vascepa.    GERD: Continue Protonix   Anemia of chronic disease:  Hemoglobin of 10-12.  We will continue to monitor.    Benign Prostatic Hyperplasia: Continue finasteride. Does not take flomax due to sulfa allergy.    Left stump wound with leg pain.  Patient takes Percocet at home.   Pain control.      MRSA colonization:    Hyperkalemia:  S/p lokelma.  Repeat potassium wnl.    Homelessness Bilateral amputee with ill fitting prosthesis , stump wound.  CSW on board.  Plan for SNF when bed is available.            Estimated body mass index is 40.43 kg/m as calculated from the following:   Height as of this  encounter: 5\' 10"  (1.778 m).   Weight as of this encounter: 127.8 kg.         Consultants: nephrology Procedures performed: none.   Disposition: Skilled nursing facility Diet recommendation:  Discharge Diet Orders (From admission, onward)     Start     Ordered   10/19/21 0000  Diet - low sodium heart healthy        10/19/21 1433           Carb modified diet DISCHARGE MEDICATION: Allergies as of 10/19/2021       Reactions   Elemental Sulfur Itching   Patient stated he's allergic to "sulfur" AND "sulfa"   Sulfa Antibiotics Itching        Medication List     STOP taking these medications     amLODipine 10 MG tablet Commonly known as: NORVASC   HumaLOG KwikPen 100 UNIT/ML KwikPen Generic drug: insulin lispro   insulin aspart 100 UNIT/ML FlexPen Commonly known as: NOVOLOG Replaced by: insulin aspart 100 UNIT/ML injection   insulin glargine 100 UNIT/ML Solostar Pen Commonly known as: LANTUS Replaced by: insulin glargine-yfgn 100 UNIT/ML injection   lisinopril 10 MG tablet Commonly known as: ZESTRIL   metFORMIN 1000 MG tablet Commonly known as: GLUCOPHAGE   oxybutynin 5 MG tablet Commonly known as: DITROPAN   Oxycodone HCl 10 MG Tabs       TAKE these medications    allopurinol 100 MG tablet Commonly known as: ZYLOPRIM Take 1 tablet (100 mg total) by mouth daily.   atorvastatin 80 MG tablet Commonly known as: LIPITOR Take 1 tablet (80 mg total) by mouth daily.   carvedilol 3.125 MG tablet Commonly known as: Coreg Take 1 tablet (3.125 mg total) by mouth 2 (two) times daily.   cyclobenzaprine 10 MG tablet Commonly known as: FLEXERIL Take 10 mg by mouth at bedtime.   dapsone 100 MG tablet TAKE 1 TABLET (100 MG TOTAL) BY MOUTH DAILY. What changed: how much to take   doravirine 100 MG Tabs tablet Commonly known as: PIFELTRO Take 1 tablet (100 mg total) by mouth daily.   Dovato 50-300 MG tablet Generic drug: dolutegravir-lamiVUDine Take 1 tablet by mouth daily.   escitalopram 20 MG tablet Commonly known as: LEXAPRO Take 1 tablet (20 mg total) by mouth daily. What changed:  when to take this reasons to take this   ferrous sulfate 325 (65 FE) MG tablet Take 1 tablet (325 mg total) by mouth every Monday, Wednesday, and Friday at 6 PM. Do not take with other medicines.   finasteride 5 MG tablet Commonly known as: PROSCAR Take 5 mg by mouth daily.   gabapentin 100 MG capsule Commonly known as: NEURONTIN Take 1 capsule (100 mg total) by mouth 3 (three) times daily. What changed: Another medication with the same name was removed. Continue  taking this medication, and follow the directions you see here.   insulin aspart 100 UNIT/ML injection Commonly known as: novoLOG Inject 5 Units into the skin 3 (three) times daily with meals. Replaces: insulin aspart 100 UNIT/ML FlexPen   insulin aspart 100 UNIT/ML injection Commonly known as: novoLOG CBG 70 - 120: 0 units  CBG 121 - 150: 2 units  CBG 151 - 200: 3 units  CBG 201 - 250: 5 units  CBG 251 - 300: 8 units  CBG 301 - 350: 11 units  CBG 351 - 400: 15 units   insulin glargine-yfgn 100 UNIT/ML injection Commonly known as: SEMGLEE Inject 0.2 mLs (20 Units  total) into the skin daily. Start taking on: October 20, 2021 Replaces: insulin glargine 100 UNIT/ML Solostar Pen   mupirocin cream 2 % Commonly known as: BACTROBAN Apply topically daily. Start taking on: October 20, 2021   nitroGLYCERIN 0.4 MG SL tablet Commonly known as: NITROSTAT Place 0.4 mg under the tongue every 5 (five) minutes as needed for chest pain.   onetouch ultrasoft lancets Use as instructed   oxyCODONE-acetaminophen 10-325 MG tablet Commonly known as: PERCOCET Take 1 tablet by mouth every 4 (four) hours as needed for up to 3 days (severe pain).   pantoprazole 40 MG tablet Commonly known as: PROTONIX Take 1 tablet (40 mg total) by mouth daily.   senna 8.6 MG Tabs tablet Commonly known as: SENOKOT Take 2 tablets (17.2 mg total) by mouth at bedtime.   sodium bicarbonate 650 MG tablet Take 1 tablet (650 mg total) by mouth 2 (two) times daily.   Vascepa 1 g capsule Generic drug: icosapent Ethyl TAKE 2 CAPSULES (2 G TOTAL) BY MOUTH TWO TIMES DAILY. What changed:  how much to take how to take this when to take this               Discharge Care Instructions  (From admission, onward)           Start     Ordered   10/19/21 0000  Discharge wound care:       Comments: Apply Bactroban to left stump Q day, then cover with foam dressing.  (Change foam dressing Q 3 days or PRN  soiling.)  10/10/21 1057     10/19/21 1433            Follow-up Information     [name], NP Follow up.   Specialty: Adult Health Nurse Practitioner Why: hospital discharge follow up Contact information: [address] [place] [address] [phone]         Jettie Booze, MD .   Specialties: Cardiology, Radiology, Interventional Cardiology Contact information: [address] [place] [address] [phone]         [name], MD Follow up.   Specialty: Orthopedic Surgery Contact information: [place] [address] [phone]                Discharge Exam: Danley Danker Weights   10/17/21 0356 10/18/21 0607 10/19/21 0448  Weight: 126.1 kg 125.6 kg 127.8 kg   General exam: Appears calm and comfortable  Respiratory system: Clear to auscultation. Respiratory effort normal. Cardiovascular system: S1 & S2 heard, RRR. No JVD,  Gastrointestinal system: Abdomen is nondistended, soft and nontender.  Normal bowel sounds heard. Central nervous system: Alert and oriented. No focal neurological deficits. Extremities: bilateral amputee Skin: No rashes, Psychiatry:  Mood & affect appropriate.    Condition at discharge: fair  The results of significant diagnostics from this hospitalization (including imaging, microbiology, ancillary and laboratory) are listed below for reference.   Imaging Studies: US RENAL  Result Date: 10/12/2021 CLINICAL DATA:  AKI EXAM: RENAL / URINARY TRACT ULTRASOUND COMPLETE COMPARISON:  Renal ultrasound dated Aug 22, 2021 FINDINGS: Right Kidney: Renal measurements: 11.3 x 5.1 x 6.6 cm = volume: 138 mL. Increased parenchymal echogenicity no mass. Mild hydronephrosis. Left Kidney: Renal measurements: 11.0 x 5.2 x 4.8 cm = volume: 144 mL. Increased parenchymal echogenicity no mass. Mild hydronephrosis. Bladder: Appears normal for degree of bladder distention. Bilateral ureter jets  visualized. Other: Prostatomegaly, measuring up to 4.6 cm  IMPRESSION: 1. Mild bilateral hydronephrosis. 2. Bilateral increased parenchymal echogenicity, findings can be seen in the setting of medical renal disease. 3. Prostatomegaly. Electronically Signed   By: Yetta Glassman M.D.   On: 10/12/2021 12:13   DG CHEST PORT 1 VIEW  Result Date: 10/09/2021 CLINICAL DATA:  Hyperglycemia. EXAM: PORTABLE CHEST 1 VIEW COMPARISON:  09/29/2020 FINDINGS: The heart size and mediastinal contours are within normal limits. Both lungs are clear. The visualized skeletal structures are unremarkable. IMPRESSION: No active disease. Electronically Signed   By: Kerby Moors M.D.   On: 10/09/2021 06:05    Microbiology: Results for orders placed or performed during the hospital encounter of 10/08/21  MRSA Next Gen by PCR, Nasal     Status: Abnormal   Collection Time: 10/09/21  2:20 PM   Specimen: Nasal Mucosa; Nasal Swab  Result Value Ref Range Status   MRSA by PCR Next Gen DETECTED (A) NOT DETECTED Final    Comment: RESULT CALLED TO, READ BACK BY AND VERIFIED WITH: RN LINDA CURRAN ON 10/09/21 @ 1549 BY DRT (NOTE) The GeneXpert MRSA Assay (FDA approved for NASAL specimens only), is one component of a comprehensive MRSA colonization surveillance program. It is not intended to diagnose MRSA infection nor to guide or monitor treatment for MRSA infections. Test performance is not FDA approved in patients less than 54 years old. Performed at [facility], [place] [address], [place], [place] [address]   Urine Culture     Status: Abnormal   Collection Time: 10/10/21  6:43 PM   Specimen: Urine, Clean Catch  Result Value Ref Range Status   Specimen Description URINE, CLEAN CATCH  Final   Special Requests   Final    NONE Performed at [facility], [place] [address], [place], [place] [address]    Culture (A)  Final    >=100,000 COLONIES/mL KLEBSIELLA PNEUMONIAE 60,000 COLONIES/mL SPHINGOMONAS PAUCIMOBILIS     Report Status 10/16/2021 FINAL  Final   Organism ID, Bacteria KLEBSIELLA PNEUMONIAE (A)  Final   Organism ID, Bacteria SPHINGOMONAS PAUCIMOBILIS (A)  Final      Susceptibility   Klebsiella pneumoniae - MIC*    AMPICILLIN >=32 RESISTANT Resistant     CEFAZOLIN >=64 RESISTANT Resistant     CEFEPIME >=32 RESISTANT Resistant     CEFTRIAXONE >=64 RESISTANT Resistant     CIPROFLOXACIN <=0.25 SENSITIVE Sensitive     GENTAMICIN >=16 RESISTANT Resistant     IMIPENEM <=0.25 SENSITIVE Sensitive     NITROFURANTOIN 64 INTERMEDIATE Intermediate     TRIMETH/SULFA >=320 RESISTANT Resistant     AMPICILLIN/SULBACTAM >=32 RESISTANT Resistant     PIP/TAZO <=4 SENSITIVE Sensitive     * >=100,000 COLONIES/mL KLEBSIELLA PNEUMONIAE   Sphingomonas paucimobilis - MIC*    CEFAZOLIN >=64 RESISTANT Resistant     GENTAMICIN >=16 RESISTANT Resistant     CIPROFLOXACIN 1 SENSITIVE Sensitive     IMIPENEM <=0.25 SENSITIVE Sensitive     TRIMETH/SULFA >=320 RESISTANT Resistant     * 60,000 COLONIES/mL SPHINGOMONAS PAUCIMOBILIS  Culture, blood (Routine X 2) w Reflex to ID Panel     Status: None   Collection Time: 10/14/21  2:54 PM   Specimen: BLOOD RIGHT HAND  Result Value Ref Range Status   Specimen Description BLOOD RIGHT HAND  Final   Special Requests   Final    BOTTLES DRAWN AEROBIC AND ANAEROBIC Blood Culture adequate volume   Culture   Final    NO GROWTH 5 DAYS Performed at  [facility], [place] [address], [place], [place] [address]    Report Status 10/19/2021 FINAL  Final  Culture, blood (Routine X 2) w Reflex to ID Panel     Status: None   Collection Time: 10/14/21  2:54 PM   Specimen: BLOOD  Result Value Ref Range Status   Specimen Description BLOOD RIGHT ANTECUBITAL  Final   Special Requests   Final    BOTTLES DRAWN AEROBIC AND ANAEROBIC Blood Culture adequate volume   Culture   Final    NO GROWTH 5 DAYS Performed at [facility], [place] [address], [place], [place] [address]    Report  Status 10/19/2021 FINAL  Final   *Note: Due to a large number of results and/or encounters for the requested time period, some results have not been displayed. A complete set of results can be found in Results Review.    Labs: CBC: Recent Labs  Lab 10/15/21 0146 10/17/21 0350  WBC 9.9 6.9  NEUTROABS 7.4 4.4  HGB 10.4* 10.8*  HCT 32.7* 33.1*  MCV 90.6 90.7  PLT 267 756   Basic Metabolic Panel: Recent Labs  Lab 10/13/21 0236 10/14/21 0117 10/15/21 0146 10/16/21 0423 10/17/21 0350 10/19/21 0543  NA 135 132* 136 136 136 138  K 4.8 5.0 5.2* 5.5* 4.7 4.8  CL 104 102 106 106 106 104  CO2 20* 20* 20* 21* 21* 20*  GLUCOSE 247* 293* 225* 293* 253* 176*  BUN 52* 59* 66* 68* 60* 62*  CREATININE 4.44* 4.56* 4.53* 4.39* 3.84* 4.18*  CALCIUM 8.9 9.0 8.7* 8.8* 8.8* 8.8*  MG 2.1  --   --   --   --   --   PHOS 4.0 4.1 3.7 3.8  --   --    Liver Function Tests: Recent Labs  Lab 10/13/21 0236 10/14/21 0117 10/15/21 0146 10/16/21 0423  ALBUMIN 2.7* 2.9* 2.5* 2.6*   CBG: Recent Labs  Lab 10/18/21 1208 10/18/21 1648 10/18/21 2103 10/19/21 0805 10/19/21 1236  GLUCAP 147* 99 246* 189* 207*    Discharge time spent: 45 minutes.   Signed: Hosie Poisson, MD [facility] 10/19/2021

## 2021-10-19 NOTE — TOC Transition Note (Addendum)
Transition of Care [facility]) - CM/SW Discharge Note   Patient Details  Name: Keith [name] MRN: [id number] Date of Birth: 11-Nov-1965  [facility] CM/SW Contact:  Milas Gain, [place] Phone Number: 10/19/2021, 2:52 PM   Clinical Narrative:     Patient will DC to: [facility]    Anticipated DC date: 10/19/2021  Family notified: Patient Declined   Transport by: Corey Harold  ?[facility]. Patients car is in one of the designated parking lots. [place] ticket number is [id number].Security informed CSW that patients car will not be towed.[place] ticket number is [id number]. RN to follow  and add in her note patients license plate number.  Per MD patient ready for DC to [facility] . RN, patient, patients caseworker with [facility],and facility notified of DC. Discharge Summary sent to facility. RN given number for report tele# [phone] RM# [id number] . DC packet on chart. Ambulance transport requested for patient.     ?    Final next level of care: Skilled Nursing Facility Barriers to Discharge: No Barriers Identified   Patient Goals and CMS Choice Patient states their goals for this hospitalization and ongoing recovery are:: SNF CMS Medicare.gov Compare Post Acute Care list provided to:: Patient Choice offered to / list presented to : Patient  Discharge Placement              Patient chooses bed at: [facility] Patient to be transferred to facility by: his vehicle Name of family member notified: Patient declined Patient and family notified of of transfer: 10/19/21  Discharge Plan and Services In-house Referral: Clinical Social Work Discharge Planning Services: CM Consult Post Acute Care Choice: NA                               Social Determinants of Health (SDOH) Interventions     Readmission Risk Interventions    10/10/2021    1:31 PM 10/21/2020   11:53 AM 08/31/2020    2:30 PM  Readmission Risk  Prevention Plan  Transportation Screening Complete Complete   PCP or Specialist Appt within 3-5 Days     HRI or [place] Work Consult for [place] Planning/Counseling     [place] Screening     Medication Review Press photographer) Complete Complete   PCP or Specialist appointment within 3-5 days of discharge Complete Complete   HRI or [place] Complete Complete   SW Recovery Care/Counseling Consult Complete Complete   Palliative Care Screening Not Applicable Not [place] Not Applicable Complete      Information is confidential and restricted. Go to Review Flowsheets to unlock data.

## 2021-10-19 NOTE — Progress Notes (Signed)
[facility]                                                                               [name], is a 56 y.o. male, DOB - [birth year], BTD:[id number] Admit date - 10/08/2021    Outpatient Primary MD for the patient is [name], NP  LOS - 11  days    Brief summary   Keith [name] is a 56 y.o. male with past medical history of HIV/ AIDS on antiretroviral therapy, hypertension, hyperlipidemia, stage IV CKD with baseline creatinine of 2.8-3.4, type 2 diabetes mellitus, anemia of chronic disease, BPH presented to hospital with elevated blood glucose level.  Patient stated that he was spending his time outside for last 2 to 3 days and glucometer was reading high.  He reported good compliance with basal insulin but not with mealtime insulin.  Patient was hospitalized in May 2023 for hyperglycemia.  In the ED, vitals were stable.  Blood glucose level was more than 600.  Sodium was 123 and corrected to 133.  Anion gap 11.  CBC showed normal WBC count. Patient received LR bolus 2.5 L and insulin drip and was admitted to hospital for hyperglycemia and AKI. Therapy eval recommending SNF.       Assessment & Plan    Assessment and Plan:    Uncontrolled type 2 DM, with hyperglycemia, insulin dependent.  Non compliant to meds.  CBG (last 3)  Recent Labs    10/16/21 0755 10/16/21 1306 10/16/21 1625  GLUCAP 271* 252* 232*   Increased semglee to 20 units daily. Increased novolog to 5 units TIDAC. Changed to moderate SSI.  Last A1c is 6%.  No changes to meds today. Monitor for another 24 hours before making changes.     Acute Kidney Injury on CKD 4 with bilateral mild hydronephrosis  Baseline creatinine appears to be at 3.3. suspect a component of urinary retention.  Creatinine up at 4.1. continue to monitor.  Avoid nephrotoxins, dehydration and hypotension.  Good urine output.   HIV/AIDS: Most recent CD4 count noted to be 129 on 08/17/2021.   Resume anti viral  treatment.   Klebsiella UTI present on admission:  Rocephin changed to Meropenam.  Complete 5 days of treatment.    Essential Hypertension:  Bp parameters are optimal. Will add hydralazine prn.  On Coreg, Norvasc and lisinopril as outpatient.   Holding lisinopril for AKI.    Hyperlipidemia: Continue statin and Vascepa.    GERD: Continue Protonix   Anemia of chronic disease:  Hemoglobin of 10-12.  We will continue to monitor.    Benign Prostatic Hyperplasia: Continue finasteride. Does not take flomax due to sulfa allergy.   Left stump wound with leg pain.  Patient takes Percocet at home.   Pain control.    MRSA colonization:   Hyperkalemia:  S/p lokelma.  Repeat potassium wnl.   Homelessness Bilateral amputee with ill fitting prosthesis , stump wound.  CSW on board.  Plan for SNF when bed is available.        Estimated body mass index is 40.43 kg/m as calculated from the following:   Height as of this encounter: 5\' 10"  (  1.778 m).   Weight as of this encounter: 127.8 kg.  Code Status: FULL CODE.  DVT Prophylaxis:  heparin injection 5,000 Units Start: 10/09/21 1400   Level of Care: Level of care: Progressive Family Communication: nONE AT BEDSIDE.   Disposition Plan:     Remains inpatient appropriate:  HOMELESSNESS.   Procedures:  NONE.   Consultants:   Nephrology.   Antimicrobials:   Anti-infectives (From admission, onward)    Start     Dose/Rate Route Frequency Ordered Stop   10/19/21 1300  levofloxacin (LEVAQUIN) tablet 750 mg  Status:  Discontinued        750 mg Oral Daily 10/19/21 1209 10/19/21 1213   10/19/21 1300  levofloxacin (LEVAQUIN) tablet 750 mg        750 mg Oral Daily 10/19/21 1213 10/20/21 0959   10/16/21 1730  meropenem (MERREM) 1 g in sodium chloride 0.9 % 100 mL IVPB  Status:  Discontinued        1 g 200 mL/hr over 30 Minutes Intravenous Every 12 hours 10/16/21 1634 10/19/21 1209   10/12/21 0945  cefTRIAXone (ROCEPHIN) 1 g in  sodium chloride 0.9 % 100 mL IVPB  Status:  Discontinued        1 g 200 mL/hr over 30 Minutes Intravenous Every 24 hours 10/12/21 0859 10/16/21 1634   10/09/21 1000  dapsone tablet 100 mg        100 mg Oral Daily 10/09/21 0129     10/09/21 1000  dolutegravir-lamiVUDine (DOVATO) 50-300 MG per tablet 1 tablet        1 tablet Oral Daily 10/09/21 0129     10/09/21 1000  doravirine (PIFELTRO) tablet 100 mg        100 mg Oral Daily 10/09/21 0129          Medications  Scheduled Meds:  atorvastatin  80 mg Oral Daily   carvedilol  3.125 mg Oral BID WC   dapsone  100 mg Oral Daily   dolutegravir-lamiVUDine  1 tablet Oral Daily   doravirine  100 mg Oral Daily   ferrous sulfate  325 mg Oral Q M,W,F-1800   finasteride  5 mg Oral Daily   gabapentin  100 mg Oral TID   heparin injection (subcutaneous)  5,000 Units Subcutaneous Q8H   icosapent Ethyl  2 g Oral BID   insulin aspart  0-15 Units Subcutaneous TID WC   insulin aspart  0-5 Units Subcutaneous QHS   insulin aspart  5 Units Subcutaneous TID WC   insulin glargine-yfgn  20 Units Subcutaneous Daily   levofloxacin  750 mg Oral Daily   mupirocin cream   Topical Daily   pantoprazole  40 mg Oral Daily   senna  2 tablet Oral QHS   sodium bicarbonate  650 mg Oral BID   Continuous Infusions:   PRN Meds:.acetaminophen **OR** acetaminophen, dextrose, naLOXone (NARCAN)  injection, ondansetron (ZOFRAN) IV, mouth rinse, oxyCODONE-acetaminophen **AND** oxyCODONE    Subjective:   Keith [name] was seen and examined today.  Working with PT. No new complaints.   Objective:   Vitals:   10/18/21 1759 10/18/21 2101 10/19/21 0448 10/19/21 0854  BP: (!) 141/83 (!) 139/91 134/75 (!) 149/85  Pulse: 71 74 72 69  Resp: 16 12 12 14   Temp:  97.6 F (36.4 C) 97.7 F (36.5 C) 97.8 F (36.6 C)  TempSrc:  Oral Oral Oral  SpO2: 98% 96%  95%  Weight:   127.8 kg   Height:  Intake/Output Summary (Last 24 hours) at 10/19/2021 1320 Last data  filed at 10/19/2021 0730 Gross per 24 hour  Intake 1291 ml  Output 575 ml  Net 716 ml    Filed Weights   10/17/21 0356 10/18/21 0607 10/19/21 0448  Weight: 126.1 kg 125.6 kg 127.8 kg     Exam General exam: Appears calm and comfortable  Respiratory system: Clear to auscultation. Respiratory effort normal. Cardiovascular system: S1 & S2 heard, RRR. No JVD,  No pedal edema. Gastrointestinal system: Abdomen is nondistended, soft and nontender. Normal bowel sounds heard. Central nervous system: Alert and oriented. No focal neurological deficits. Extremities: bilateral amputee.  Skin: No rashes,  Psychiatry: Mood & affect appropriate.      Data Reviewed:  I have personally reviewed following labs and imaging studies   CBC Lab Results  Component Value Date   WBC 6.9 10/17/2021   RBC 3.65 (L) 10/17/2021   HGB 10.8 (L) 10/17/2021   HCT 33.1 (L) 10/17/2021   MCV 90.7 10/17/2021   MCH 29.6 10/17/2021   PLT 262 10/17/2021   MCHC 32.6 10/17/2021   RDW 12.9 10/17/2021   LYMPHSABS 1.5 10/17/2021   MONOABS 0.6 10/17/2021   EOSABS 0.3 10/17/2021   BASOSABS 0.1 16/01/9603     Last metabolic panel Lab Results  Component Value Date   NA 138 10/19/2021   K 4.8 10/19/2021   CL 104 10/19/2021   CO2 20 (L) 10/19/2021   BUN 62 (H) 10/19/2021   CREATININE 4.18 (H) 10/19/2021   GLUCOSE 176 (H) 10/19/2021   GFRNONAA 16 (L) 10/19/2021   GFRAA 35 (L) 08/23/2019   CALCIUM 8.8 (L) 10/19/2021   PHOS 3.8 10/16/2021   PROT 6.7 10/11/2021   ALBUMIN 2.6 (L) 10/16/2021   LABGLOB 4.1 09/07/2018   AGRATIO 0.9 (L) 09/07/2018   BILITOT 0.4 10/11/2021   ALKPHOS 96 10/11/2021   AST 15 10/11/2021   ALT 11 10/11/2021   ANIONGAP 14 10/19/2021    CBG (last 3)  Recent Labs    10/18/21 2103 10/19/21 0805 10/19/21 1236  GLUCAP 246* 189* 207*       Coagulation Profile: No results for input(s): "INR", "PROTIME" in the last 168 hours.   Radiology Studies: No results  found.     Hosie Poisson M.D. [facility] 10/19/2021, 1:20 PM  Available via Epic secure chat 7am-7pm After 7 pm, please refer to night coverage provider listed on amion.

## 2021-10-19 NOTE — Progress Notes (Signed)
Physical Therapy Treatment Patient Details Name: Keith [name] MRN: [id number] DOB: March 02, 1966 Today's Date: 10/19/2021   History of Present Illness Pt is 56 yo male who is homeless, presents to [facility] on 10/08/21 with hyperglycemia and volume contraction worsened by spending a lot of time outdoors. Has recent Klebsiella UTI.  PMHx:  R BKA, L AKA, AIDS, CVA, CAD, MI, HTN, CKD, DM2, PVD, CKD    PT Comments    Pt is progressing well towards goals of PT. Pt requesting full independence during today's session, refused gait belt donning by therapist and any hands on assist, pt reported "it will make me fall, don't touch me." Pt able to don L prosthesis for ambulation during today's session, tolerated 40 total ft of ambulation with RW and min G assist for safety, with 1 seated rest break. RN notified of LLE wound status and pt need for a dressing change. Pt presented mod I for bed mobility and min G for transfers with RW. VSS on RA, pt with 2/4 DOE scale. At this time, pt would be appropriate for d/c to SNF in order to increase functional independence and improve tolerance to activity. Pt continues to present with limitations listed below, therefore would continue to benefit from skilled PT while here. Will continue to follow acutely.   Recommendations for follow up therapy are one component of a multi-disciplinary discharge planning process, led by the attending physician.  Recommendations may be updated based on patient status, additional functional criteria and insurance authorization.  Follow Up Recommendations  Skilled nursing-short term rehab (<3 hours/day) Can patient physically be transported by private vehicle: Yes   Assistance Recommended at Discharge Intermittent Supervision/Assistance  Patient can return home with the following A little help with walking and/or transfers;A little help with bathing/dressing/bathroom;Assist for transportation;Help with stairs or ramp for entrance;Assistance with  cooking/housework;Direct supervision/assist for medications management;Direct supervision/assist for financial management   Equipment Recommendations   (tbd next venue of care)    Recommendations for Other Services       Precautions / Restrictions Precautions Precautions: Fall Precaution Comments: R BKA, L AKA Restrictions Weight Bearing Restrictions: No     Mobility  Bed Mobility Overal bed mobility: Modified Independent             General bed mobility comments: with increased time and use of bed rails    Transfers Overall transfer level: Needs assistance Equipment used: Rolling walker (2 wheels) Transfers: Sit to/from Stand Sit to Stand: Min guard, Mod assist           General transfer comment: from EOB x1, from chair x1. pt with use of momentum to stand, min G for safety. pt adamantly desired independence throughout session, became frustrated with therapist coming close or hands on support for safety. transferred to chair post-ambulation. from EOB minG, from chair after seated rest break mod A for stability and power up to stand.    Ambulation/Gait Ambulation/Gait assistance: Min guard, +2 safety/equipment Gait Distance (Feet): 20 Feet (x2, 1 seated rest break) Assistive device: Rolling walker (2 wheels) Gait Pattern/deviations: Step-to pattern, Trunk flexed, Wide base of support Gait velocity: decreased Gait velocity interpretation: <1.31 ft/sec, indicative of household ambulator   General Gait Details: pt with slow and mildly unsteady gait with no overt LOB. extreme trunk flexion requiring max vc's for upright posture and safe use of RW. pt refused hands on assist from therapist stating "I need to sit down you're going to make me fall." pt with 1 seated rest  break. pt 2/4 DOE scale post ambulation, VSS on RA.   Stairs             Wheelchair Mobility    Modified Rankin (Stroke Patients Only)       Balance Overall balance assessment: Needs  assistance Sitting-balance support: No upper extremity supported, Feet unsupported Sitting balance-Leahy Scale: Good Sitting balance - Comments: able to don bilateral prosthesis in seated without LOB, with repeated supine and seated movements to don bilateral prosthesis.   Standing balance support: Reliant on assistive device for balance, Bilateral upper extremity supported Standing balance-Leahy Scale: Poor Standing balance comment: heavy reliance on RW for balance                            Cognition Arousal/Alertness: Awake/alert Behavior During Therapy: WFL for tasks assessed/performed Overall Cognitive Status: No family/caregiver present to determine baseline cognitive functioning Area of Impairment: Safety/judgement                         Safety/Judgement: Decreased awareness of deficits     General Comments: some lack of insight into deficits, pt refusing donning of gait belt, became upset with therapist assistance during ambulation, stated "you're going to make me fall, don't touch me."        Exercises      General Comments        Pertinent Vitals/Pain Pain Assessment Pain Assessment: No/denies pain    Home Living                          Prior Function            PT Goals (current goals can now be found in the care plan section) Acute Rehab PT Goals Patient Stated Goal: walk and work PT Goal Formulation: With patient Time For Goal Achievement: 10/27/21 Potential to Achieve Goals: Good Progress towards PT goals: Progressing toward goals    Frequency    Min 3X/week      PT Plan Discharge plan needs to be updated    Co-evaluation              AM-PAC PT "6 Clicks" Mobility   Outcome Measure  Help needed turning from your back to your side while in a flat bed without using bedrails?: None Help needed moving from lying on your back to sitting on the side of a flat bed without using bedrails?: None Help needed  moving to and from a bed to a chair (including a wheelchair)?: A Little Help needed standing up from a chair using your arms (e.g., wheelchair or bedside chair)?: A Little Help needed to walk in hospital room?: A Little Help needed climbing 3-5 steps with a railing? : A Little 6 Click Score: 20    End of Session   Activity Tolerance: Patient tolerated treatment well Patient left: in chair;with call bell/phone within reach;with chair alarm set;with nursing/sitter in room Nurse Communication: Mobility status;Other (comment) (pt removed mepilex during session, notified RN for dressing change and wound assessment)       Time: 5009-3818 PT Time Calculation (min) (ACUTE ONLY): 45 min  Charges:  $Gait Training: 8-22 mins $Therapeutic Activity: 23-37 mins                     Havery Moros, MS, [place] [facility] Office: [place] 10/19/2021, 11:00 AM

## 2021-11-06 ENCOUNTER — Ambulatory Visit (INDEPENDENT_AMBULATORY_CARE_PROVIDER_SITE_OTHER): Payer: Medicare Other | Admitting: Orthopedic Surgery

## 2021-11-06 ENCOUNTER — Encounter: Payer: Self-pay | Admitting: Orthopedic Surgery

## 2021-11-06 DIAGNOSIS — Z89511 Acquired absence of right leg below knee: Secondary | ICD-10-CM

## 2021-11-06 DIAGNOSIS — Z89612 Acquired absence of left leg above knee: Secondary | ICD-10-CM | POA: Diagnosis not present

## 2021-11-06 NOTE — Progress Notes (Signed)
Office Visit Note   Patient: Keith [name]           Date of Birth: [birth year]           MRN: [id number] Visit Date: 11/06/2021              Requested by: [name], NP [address] [place],  [place] [address] [name], NP  Chief Complaint  Patient presents with   Right Leg - Follow-up    BKA   Left Leg - Follow-up    AKA      HPI: Patient is a 56 year old gentleman who is status post right transtibial amputation and left above-the-knee amputation.  Patient has been doing gait training but is having difficulty ambulating due to the decreased residual volume in both legs and difficulty lifting his left above-the-knee prosthesis.  Patient states he is having hard time balancing.  Therapy at skilled nursing.  Assessment & Plan: Visit Diagnoses:  1. Hx of right BKA ([place])   2. History of left above knee amputation ([place])     Plan: Patient was given a prescription for [employer] for a new socket on the left and right,  new materials new supplies.  Follow-Up Instructions: Return in about 3 months (around 02/06/2022).   Ortho Exam  Patient is alert, oriented, no adenopathy, well-dressed, normal affect, normal respiratory effort. Examination patient has a loose fitting prosthesis on the left and right.  With the poor fitting on the left patient has difficulty lifting his foot off the ground.  Patient has been subsiding into his socket and has poor rotational stability which is making it difficult to balance.  Patient is an existing bilateral amputee with a transtibial amputation on the right and a transfemoral amputation on the left.    Patient's current comorbidities are not expected to impact the ability to function with the prescribed prosthesis. Patient verbally communicates a strong desire to use a prosthesis. Patient currently requires mobility aids to ambulate without a prosthesis.  Expects not to use mobility aids with a new prosthesis.  Patient is a K2  level ambulator that will use a prosthesis to walk around their home and the community over low level environmental barriers.      Imaging: No results found. No images are attached to the encounter.  Labs: Lab Results  Component Value Date   HGBA1C 6.0 (H) 08/17/2021   HGBA1C 5.9 (H) 11/29/2020   HGBA1C 13.9 (H) 08/29/2020   ESRSEDRATE 58 (H) 08/22/2021   ESRSEDRATE 128 (H) 10/18/2020   ESRSEDRATE 85 (H) 10/07/2020   CRP 11.2 (H) 10/18/2020   CRP 6.7 (H) 10/07/2020   CRP <0.8 11/02/2018   LABURIC 6.4 08/22/2021   LABURIC 6.3 08/07/2009   REPTSTATUS 10/19/2021 FINAL 10/14/2021   REPTSTATUS 10/19/2021 FINAL 10/14/2021   GRAMSTAIN  04/29/2017    NO WBC SEEN RARE GRAM POSITIVE COCCI IN PAIRS IN SINGLES    CULT  10/14/2021    NO GROWTH 5 DAYS Performed at [facility], [place] [address], [place], [place] [address]    CULT  10/14/2021    NO GROWTH 5 DAYS Performed at [facility] [address], [place], [place] [address]    LABORGA KLEBSIELLA PNEUMONIAE (A) 10/10/2021   LABORGA SPHINGOMONAS PAUCIMOBILIS (A) 10/10/2021     Lab Results  Component Value Date   ALBUMIN 2.6 (L) 10/16/2021   ALBUMIN 2.5 (L) 10/15/2021   ALBUMIN 2.9 (L) 10/14/2021  PREALBUMIN 25.5 06/06/2018   PREALBUMIN 7.4 (L) 03/15/2017    Lab Results  Component Value Date   MG 2.1 10/13/2021   MG 1.9 10/10/2021   MG 1.8 10/09/2021   Lab Results  Component Value Date   VD25OH 8.2 (L) 09/07/2018    Lab Results  Component Value Date   PREALBUMIN 25.5 06/06/2018   PREALBUMIN 7.4 (L) 03/15/2017      Latest Ref Rng & Units 10/17/2021    3:50 AM 10/15/2021    1:46 AM 10/11/2021    2:30 AM  CBC EXTENDED  WBC 4.0 - 10.5 K/uL 6.9  9.9  6.4   RBC 4.22 - 5.81 MIL/uL 3.65  3.61  3.86   Hemoglobin 13.0 - 17.0 g/dL 10.8  10.4  11.2   HCT 39.0 - 52.0 % 33.1  32.7  35.5   Platelets 150 - 400 K/uL 262  267  257   NEUT# 1.7 - 7.7 K/uL 4.4  7.4  4.1   Lymph# 0.7 - 4.0 K/uL 1.5  1.8  1.4       There is no height or weight on file to calculate BMI.  Orders:  No orders of the defined types were placed in this encounter.  No orders of the defined types were placed in this encounter.    Procedures: No procedures performed  Clinical Data: No additional findings.  ROS:  All other systems negative, except as noted in the HPI. Review of Systems  Objective: Vital Signs: There were no vitals taken for this visit.  Specialty Comments:  No specialty comments available.  PMFS History: Patient Active Problem List   Diagnosis Date Noted   BPH (benign prostatic hyperplasia)    GERD (gastroesophageal reflux disease)    Headache 08/22/2021   Anxiety 02/13/2021   Healthcare maintenance 02/13/2021   History of left above knee amputation ([place]) 02/07/2021   Wound dehiscence 01/03/2021   Coagulopathy ([place]) 12/22/2020   CAD (coronary artery disease) 12/22/2020   Obesity 12/22/2020   Gross hematuria 12/20/2020   ABLA (acute blood loss anemia) 12/20/2020   Dehiscence of amputation stump (HCC)    Hx of BKA, left (HCC)    Cellulitis of left foot    Acute osteomyelitis of left calcaneus (HCC)    Ulcer of heel due to diabetes mellitus ([place])    Anemia    Cellulitis 10/18/2020   Atypical chest pain 27/51/7001   Acute metabolic encephalopathy 74/94/4967   Hyperglycemia due to diabetes mellitus ([place]) 08/29/2020   Hyperglycemia 08/29/2020   Acute cerebrovascular accident (CVA) due to ischemia ([place]) 04/28/2020   Type 2 diabetes mellitus with hyperlipidemia (HCC)    PAD (peripheral artery disease) ([place])    Hypoglycemia 04/25/2020   Acute ST elevation myocardial infarction (STEMI) due to occlusion of circumflex coronary artery ([place]) 03/22/2020   Uncontrolled type 2 diabetes mellitus with hyperglycemia ([place]) 01/14/2019   Elevated glucose 01/14/2019   Hx of BKA, right ([place]) 01/14/2019   Pressure injury of skin 11/11/2018   STEMI (ST elevation myocardial infarction) ([place])  11/10/2018   CKD (chronic kidney disease) stage 4, GFR 15-29 ml/min ([place]) 11/10/2018   Amputation stump infection ([place]) 10/28/2018   Type II diabetes mellitus with renal manifestations ([place]) 08/07/2018   Uncontrolled type 2 diabetes mellitus with hyperosmolar nonketotic hyperglycemia ([place]) 08/07/2018   Non-pressure chronic ulcer of right calf limited to breakdown of skin ([place]) 07/06/2018   Type 2 diabetes mellitus without complication, without long-term current use of insulin ([place]) 06/15/2018  Chronic low back pain without sciatica 06/15/2018   Idiopathic chronic venous hypertension of left lower extremity with ulcer and inflammation (HCC)    Venous stasis ulcer of left calf limited to breakdown of skin without varicose veins ([place]) 04/23/2018   Acquired absence of right leg below knee ([place]) 06/13/2017   Acute renal failure superimposed on stage 4 chronic kidney disease ([place]) 03/15/2017   Diabetic polyneuropathy associated with type 2 diabetes mellitus ([place]) 01/23/2017   AKI (acute kidney injury) ([place]) 09/28/2016   Nausea vomiting and diarrhea 09/28/2016   Onychomycosis of multiple toenails with type 2 diabetes mellitus ([place]) 08/29/2015   MRSA carrier 04/20/2014   Penile wart 02/22/2014   HIV disease ([place])    Insulin-requiring or dependent type II diabetes mellitus ([place]) 02/04/2014   Dental anomaly 11/20/2012   Arthritis of right knee 02/23/2012   Hyperlipidemia 11/10/2011   Hyponatremia 11/10/2011   Chronic pain 08/07/2011   Meralgia paraesthetica 04/23/2011   ERECTILE DYSFUNCTION 08/22/2008   HTN (hypertension) 05/19/2008   Past Medical History:  Diagnosis Date   Abscess of right foot    abscess/ulcer of R transtibial amputation requiring IV abx   AIDS ([place])    Anemia    CAD (coronary artery disease)    a. MI with stenting of OM1 in 11/2018 with residual disease. b. acute STEMI 03/2020 s/p DES to OM1   Chronic anemia    Chronic knee pain    right   Chronic pain    CKD  (chronic kidney disease), stage IV (HCC)    CVA (cerebral vascular accident) ([place]) 04/2020   Diabetes type 2, uncontrolled    HgA1c 17.6 (04/27/2010)   Diabetic foot ulcer ([place]) 01/2017   right foot   Dilatation of aorta (HCC)    Erectile dysfunction    Genital warts    GERD (gastroesophageal reflux disease)    History of blood transfusion    HIV (human immunodeficiency virus infection) ([place]) 2009   CD4 count 100, VL 13800 (05/01/2010)   Hyperlipidemia    Hypertension    Myocardial infarction ([place])    Neuropathy    Noncompliance with medication regimen    Osteomyelitis ([place])    h/o hand   Osteomyelitis of metatarsal ([place]) 04/28/2017   Pneumonia     Family History  Problem Relation Age of Onset   Hypertension Mother    Arthritis Father    Hypertension Father    Hypertension Brother    Cancer Maternal Grandmother 59       unknown type of cancer   Depression Paternal Grandmother     Past Surgical History:  Procedure Laterality Date   AMPUTATION Right 05/02/2017   Procedure: AMPUTATION TRANSMETARSAL;  Surgeon: [name], MD;  Location: [facility];  Service: Orthopedics;  Laterality: Right;   AMPUTATION Right 06/13/2017   Procedure: RIGHT BELOW KNEE AMPUTATION;  Surgeon: [name], MD;  Location: [facility];  Service: Orthopedics;  Laterality: Right;   AMPUTATION Left 10/27/2020   Procedure: LEFT BELOW KNEE AMPUTATION;  Surgeon: [name], MD;  Location: [facility];  Service: Orthopedics;  Laterality: Left;   AMPUTATION Left 11/17/2020   Procedure: REVISION AMPUTATION BELOW KNEE, LEFT;  Surgeon: [name], MD;  Location: [facility];  Service: Orthopedics;  Laterality: Left;   AMPUTATION Left 01/03/2021   Procedure: LEFT ABOVE KNEE AMPUTATION;  Surgeon: [name], MD;  Location: [facility];  Service: Orthopedics;  Laterality: Left;   APPLICATION OF WOUND VAC Left 10/27/2020  Procedure: APPLICATION OF WOUND VAC;  Surgeon: [name], MD;  Location: [facility];  Service: Orthopedics;   Laterality: Left;   BELOW KNEE LEG AMPUTATION Right 06/13/2017   BELOW KNEE LEG AMPUTATION     CORONARY STENT INTERVENTION N/A 11/10/2018   Procedure: CORONARY STENT INTERVENTION;  Surgeon: Leonie Man, MD;  Location: [facility];  Service: Cardiovascular;  Laterality: N/A;   CORONARY/GRAFT ACUTE MI REVASCULARIZATION N/A 03/21/2020   Procedure: Coronary/Graft Acute MI Revascularization;  Surgeon: [name], MD;  Location: [facility];  Service: Cardiovascular;  Laterality: N/A;   CYSTOSCOPY N/A 11/09/2020   Procedure: CYSTOSCOPY, EVACUATION OF CLOTS, FULGERATION OF BLADDER AND BILATERIAL RETROGRADE PYELOGRAMS;  Surgeon: Janith Lima, MD;  Location: [facility];  Service: Urology;  Laterality: N/A;   HERNIA REPAIR     I & D EXTREMITY Left 08/21/2014   Procedure: INCISION AND DRAINAGE LEFT SMALL FINGER;  Surgeon: Leanora Cover, MD;  Location: [facility];  Service: Orthopedics;  Laterality: Left;   I & D EXTREMITY Right 03/18/2017   Procedure: IRRIGATION AND DEBRIDEMENT EXTREMITY;  Surgeon: [name], MD;  Location: [facility];  Service: Orthopedics;  Laterality: Right;   IR FLUORO GUIDE CV LINE RIGHT  11/02/2020   IR REMOVAL TUN CV CATH W/O FL  11/13/2020   IR US GUIDE VASC ACCESS RIGHT  11/02/2020   LEFT HEART CATH AND CORONARY ANGIOGRAPHY N/A 11/10/2018   Procedure: LEFT HEART CATH AND CORONARY ANGIOGRAPHY;  Surgeon: Leonie Man, MD;  Location: [facility];  Service: Cardiovascular;  Laterality: N/A;   LEFT HEART CATH AND CORONARY ANGIOGRAPHY N/A 03/21/2020   Procedure: LEFT HEART CATH AND CORONARY ANGIOGRAPHY;  Surgeon: [name], MD;  Location: [facility];  Service: Cardiovascular;  Laterality: N/A;   MINOR IRRIGATION AND DEBRIDEMENT OF WOUND Right 04/22/2014   Procedure: IRRIGATION AND DEBRIDEMENT OF RIGHT NECK ABCESS;  Surgeon: Jerrell Belfast, MD;  Location: [facility];  Service: ENT;  Laterality: Right;   MULTIPLE EXTRACTIONS WITH ALVEOLOPLASTY N/A 01/18/2013    Procedure: MULTIPLE EXTRACION 3, 6, 7, 10, 11, 13, 21, 22, 27, 28, 29, 30 WITH ALVEOLOPLASTY;  Surgeon: Gae Bon, DDS;  Location: [facility];  Service: Oral Surgery;  Laterality: N/A;   SKIN SPLIT GRAFT Right 03/21/2017   Procedure: IRRIGATION AND DEBRIDEMENT RIGHT FOOT AND APPLY SPLIT THICKNESS SKIN GRAFT AND WOUND VAC;  Surgeon: [name], MD;  Location: [facility];  Service: Orthopedics;  Laterality: Right;   STUMP REVISION Left 12/02/2020   Procedure: REVISION LEFT BELOW KNEE AMPUTATION;  Surgeon: [name], MD;  Location: [facility];  Service: Orthopedics;  Laterality: Left;   TEE WITHOUT CARDIOVERSION N/A 05/02/2017   Procedure: TRANSESOPHAGEAL ECHOCARDIOGRAM (TEE);  Surgeon: Acie Fredrickson Wonda Cheng, MD;  Location: [facility];  Service: Cardiovascular;  Laterality: N/A;  coincidental to orthopedic case   Social History   Occupational History   Occupation: disabled  Tobacco Use   Smoking status: Never   Smokeless tobacco: Never  Vaping Use   Vaping Use: Never used  Substance and Sexual Activity   Alcohol use: Not Currently   Drug use: Never    Comment: OTC ASA   Sexual activity: Yes    Partners: Female    Comment: declined condoms

## 2021-11-07 IMAGING — US US RENAL
1 series · 14 of 25 positions shown · non-contrast
Comparison: 11/01/2020

CLINICAL DATA: Gross hematuria, history HIV/aids, coronary artery
disease, stage IV chronic kidney disease, type II diabetes mellitus,
hypertension

EXAM:
RENAL / URINARY TRACT ULTRASOUND COMPLETE

[Series 1: us renal · 14 of 25 slices shown]
[im 1/25]
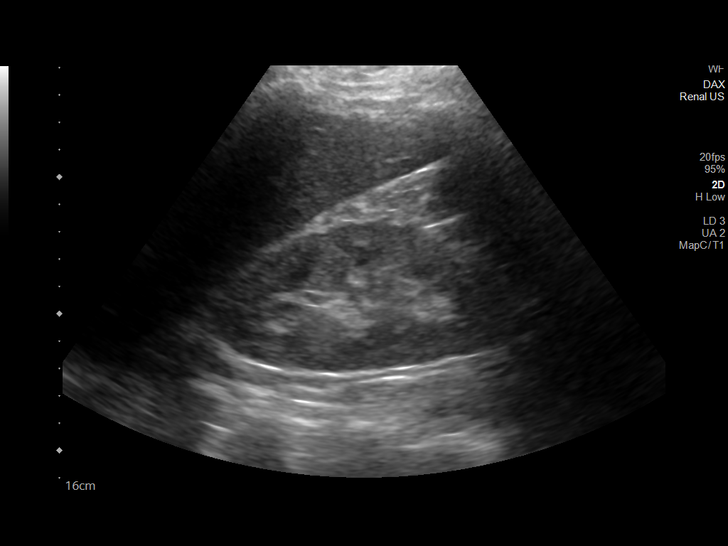
[im 3/25]
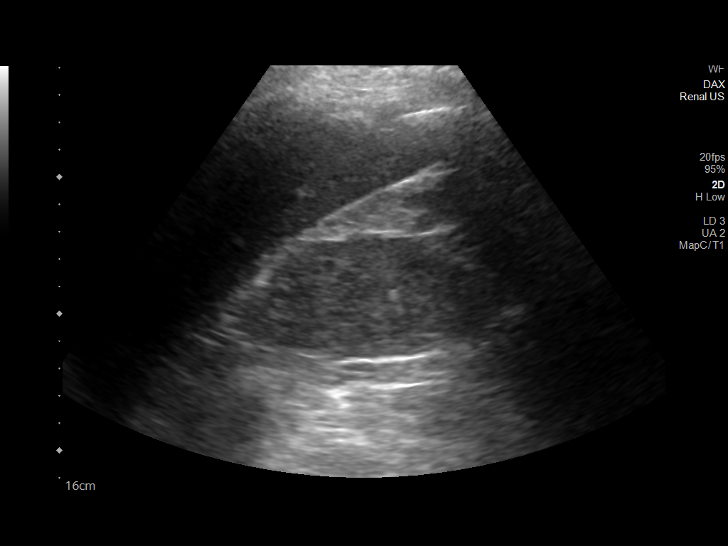
[im 5/25]
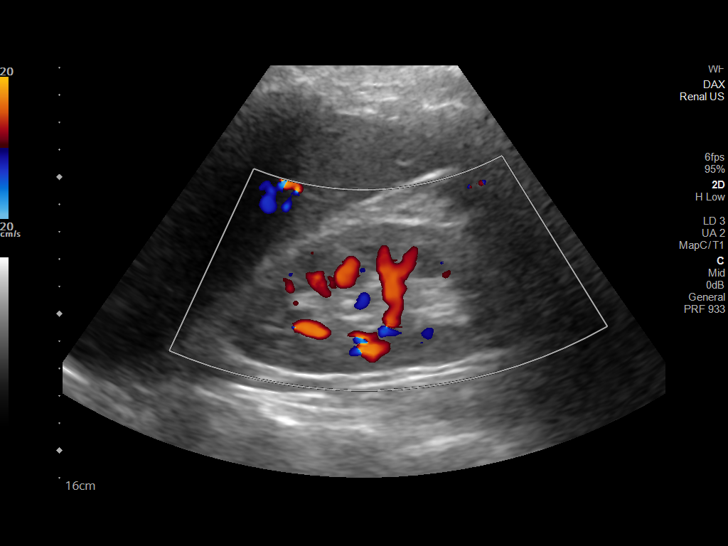
[im 7/25]
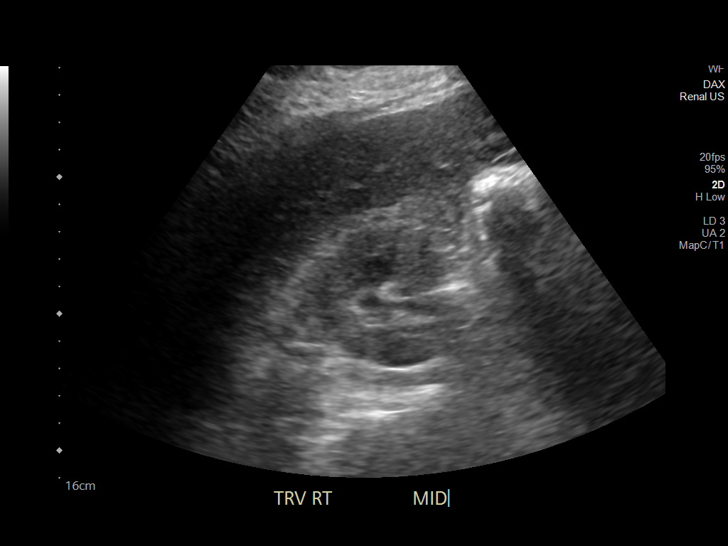
[im 9/25]
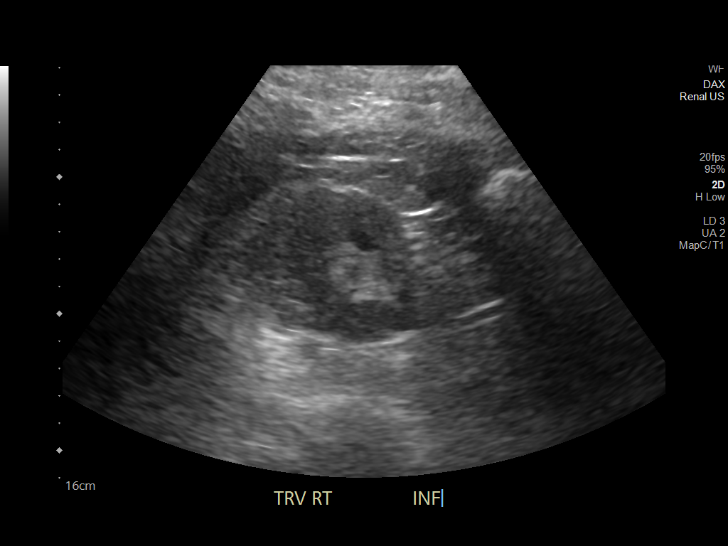
[im 10/25]
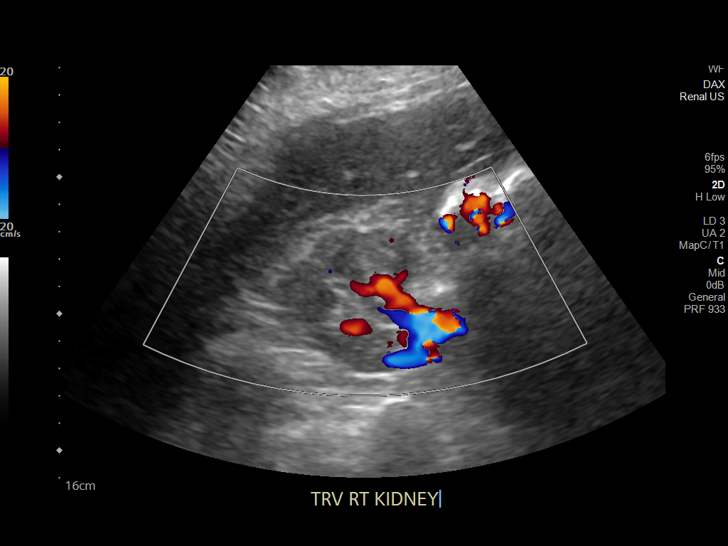
[im 12/25]
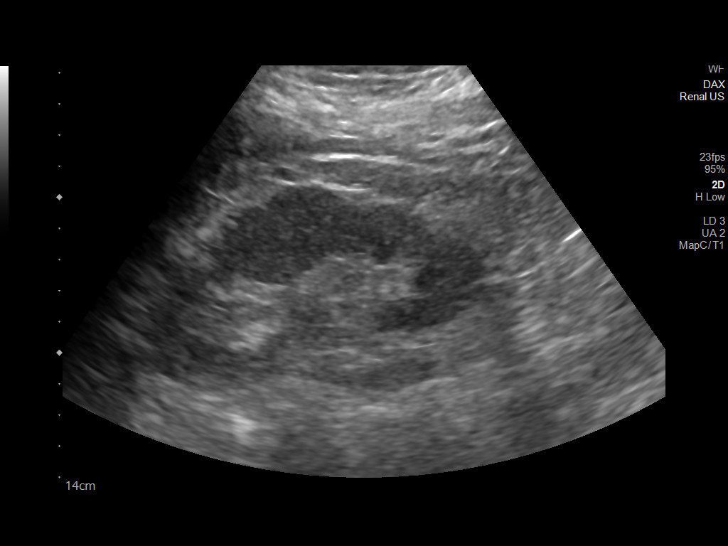
[im 14/25]
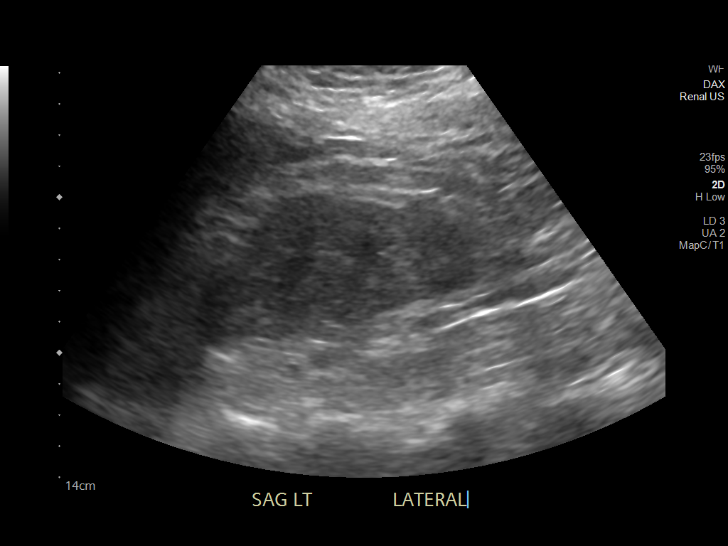
[im 16/25]
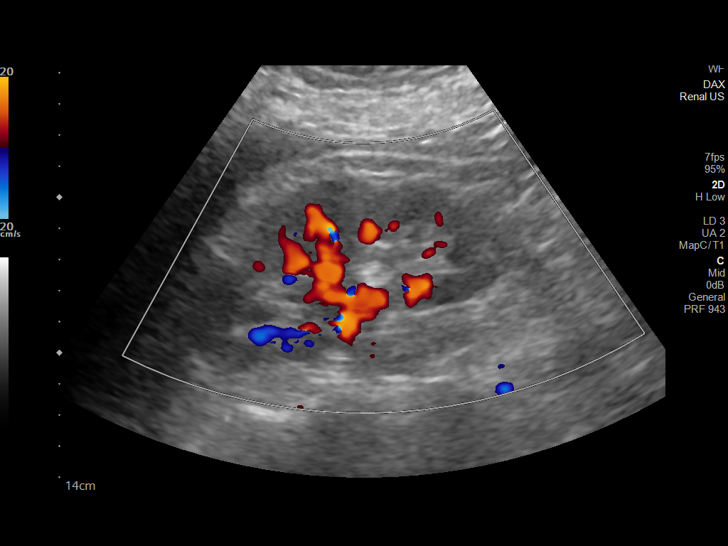
[im 17/25]
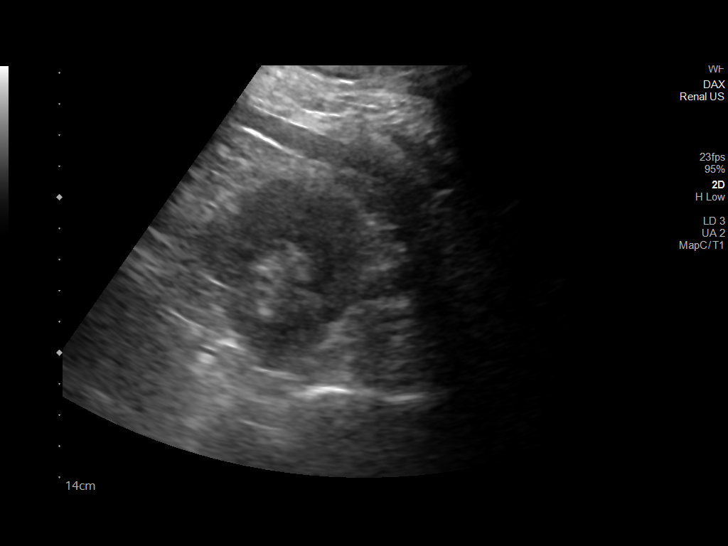
[im 19/25]
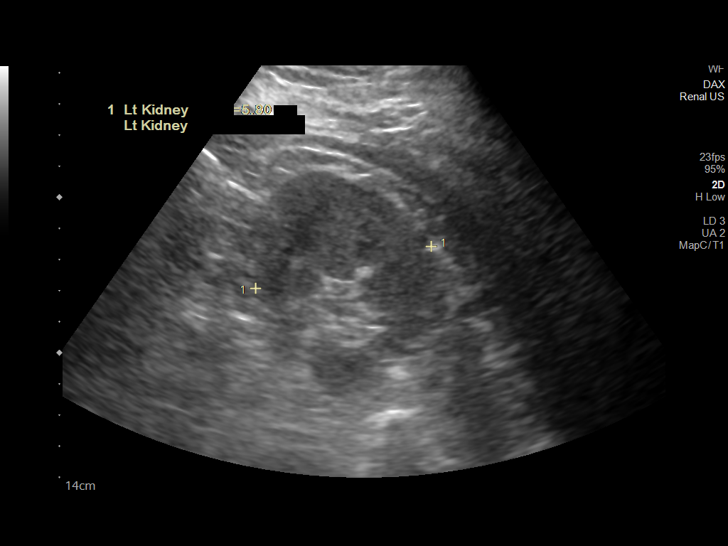
[im 21/25]
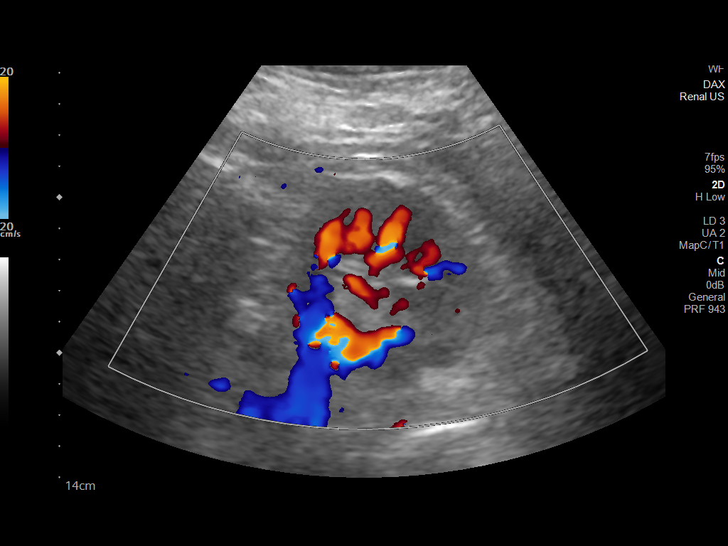
[im 23/25]
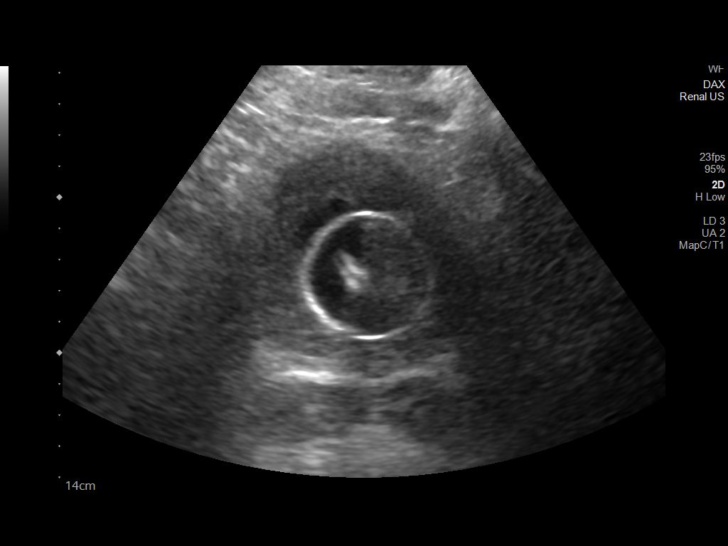
[im 25/25]
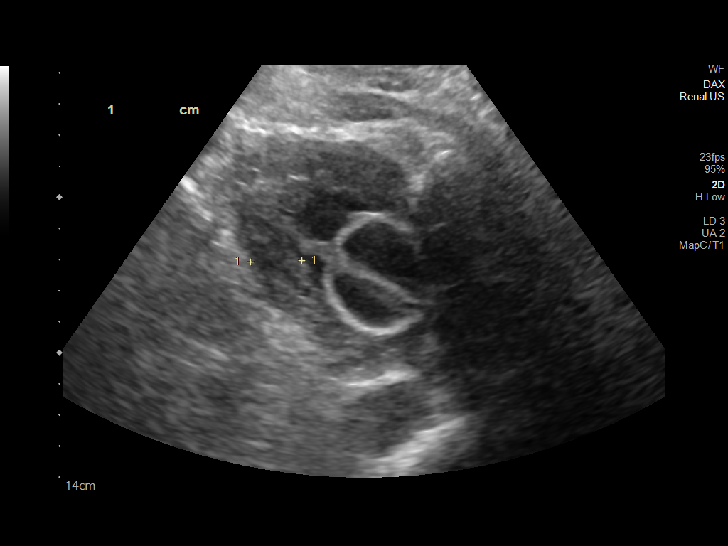

[14 of 25 positions shown; findings below may reference images not displayed]

FINDINGS: Right Kidney:

Renal measurements: 11.7 x 5.5 x 5.4 cm = volume: 183 mL. Normal
cortical thickness. Slightly increased cortical echogenicity. No
mass, hydronephrosis, or shadowing calcification.

Left Kidney:

Renal measurements: 11.3 x 5.0 x 5.8 cm = volume: 172 mL. Normal
cortical thickness. Slightly increased cortical echogenicity. No
mass, hydronephrosis, or shadowing calcification.

Bladder:

Foley catheter decompresses urinary bladder. Bladder wall appears
thickened but this may be an artifact related to underdistention.

Other:

N/A
IMPRESSION: Medical renal disease changes of both kidneys.

No evidence of renal mass, hydronephrosis, or calculi.

## 2021-11-14 IMAGING — US US RENAL
1 series · 14 of 25 positions shown · non-contrast
Comparison: 12/11/2020

CLINICAL DATA: Acute renal insufficiency

EXAM:
RENAL / URINARY TRACT ULTRASOUND COMPLETE

[Series 1: us renal · 14 of 60 slices shown]
[im 1/60]
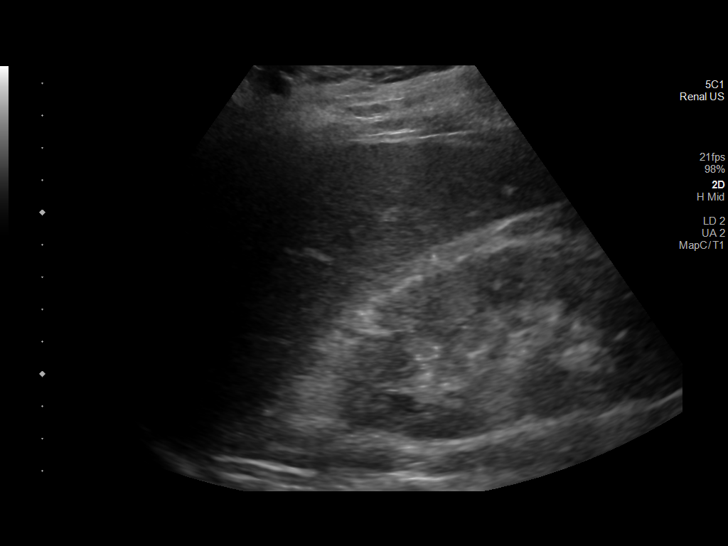
[im 5/60]
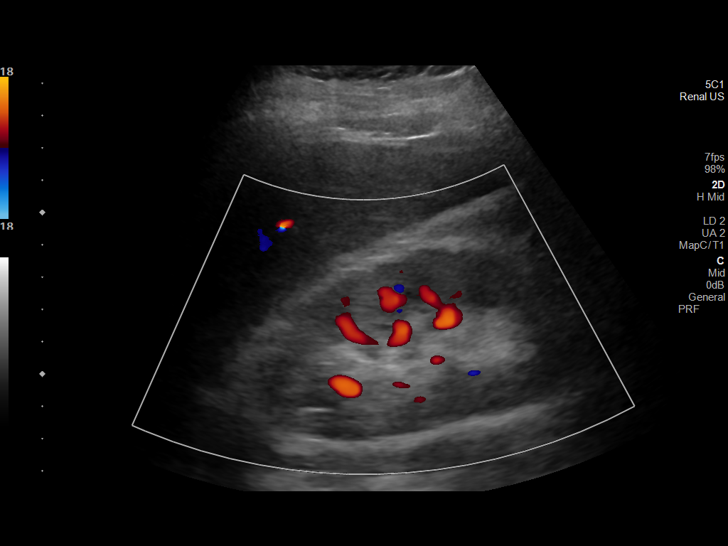
[im 10/60]
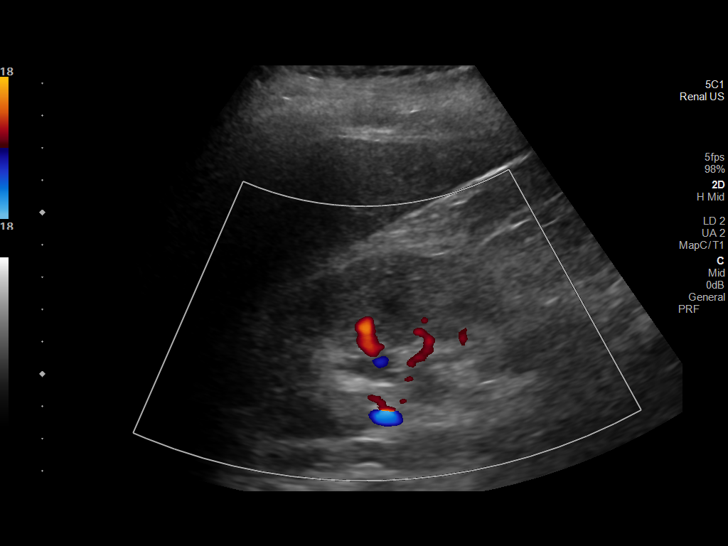
[im 15/60]
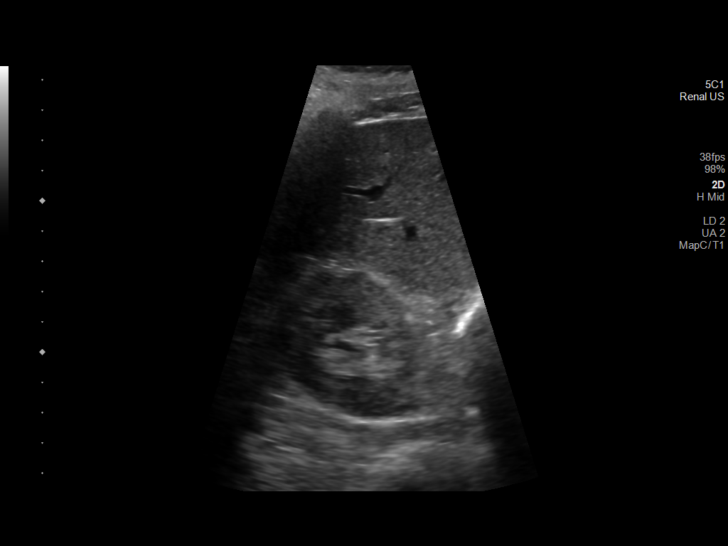
[im 20/60]
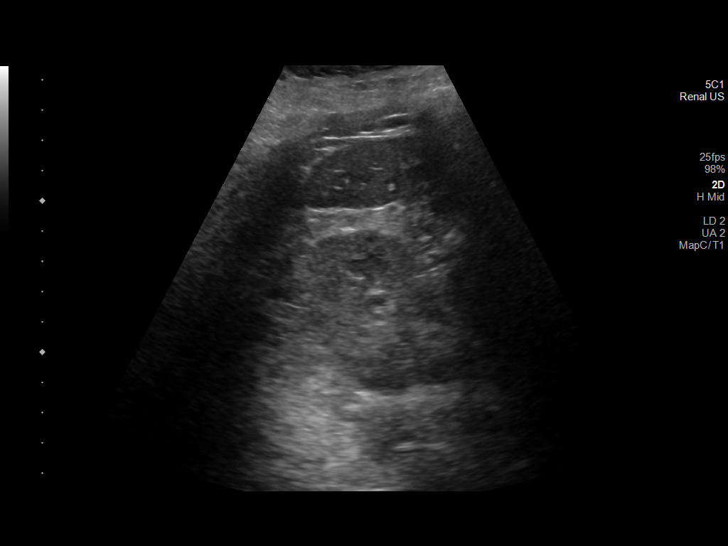
[im 23/60]
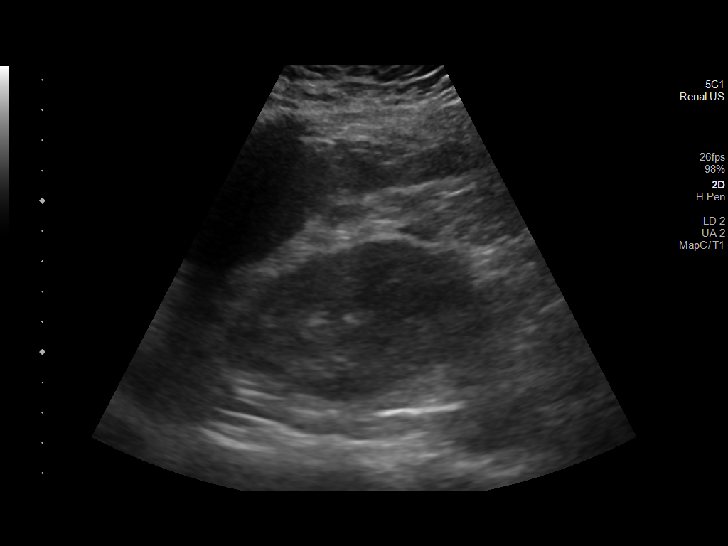
[im 28/60]
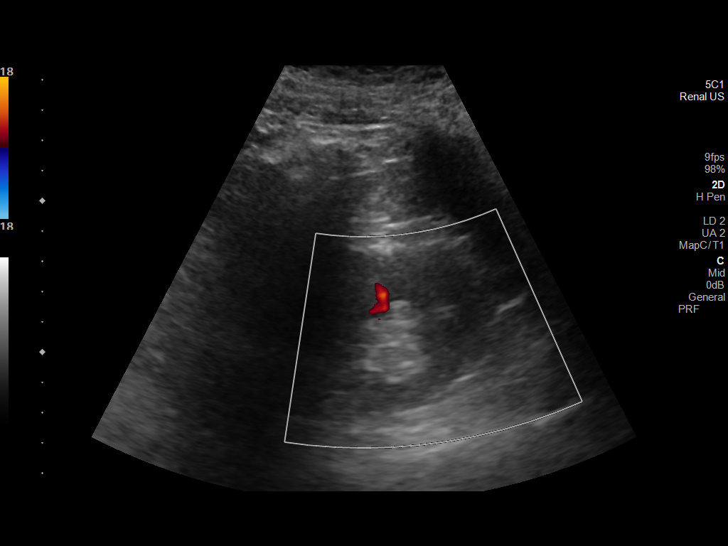
[im 32/60]
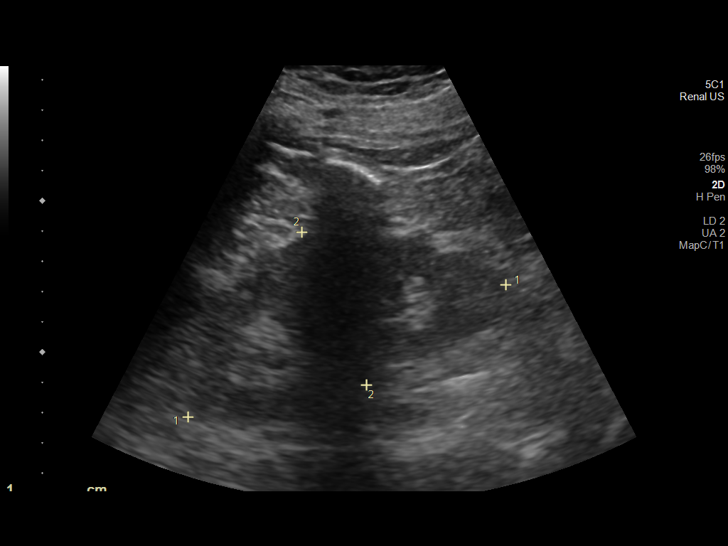
[im 37/60]
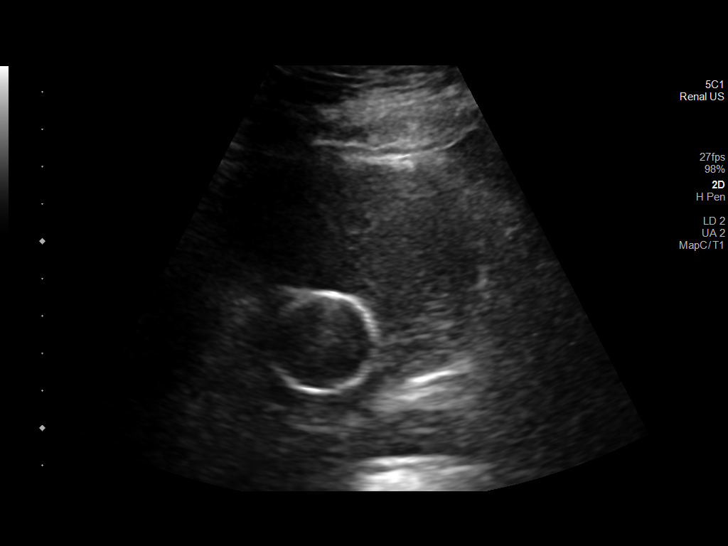
[im 40/60]
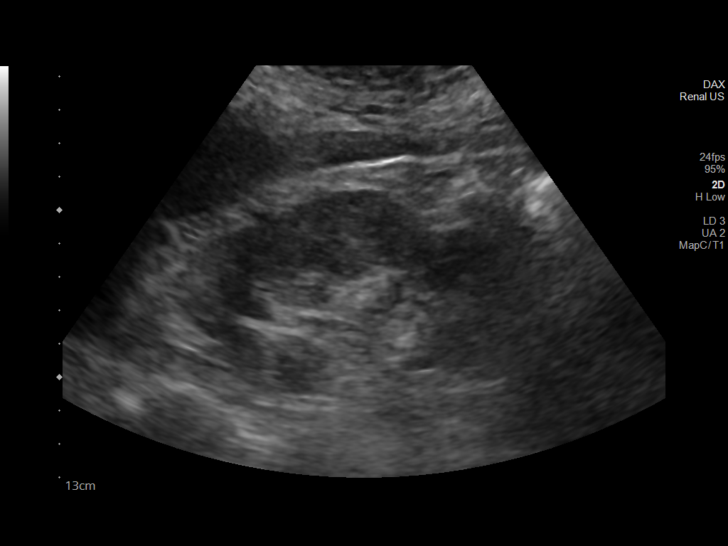
[im 45/60]
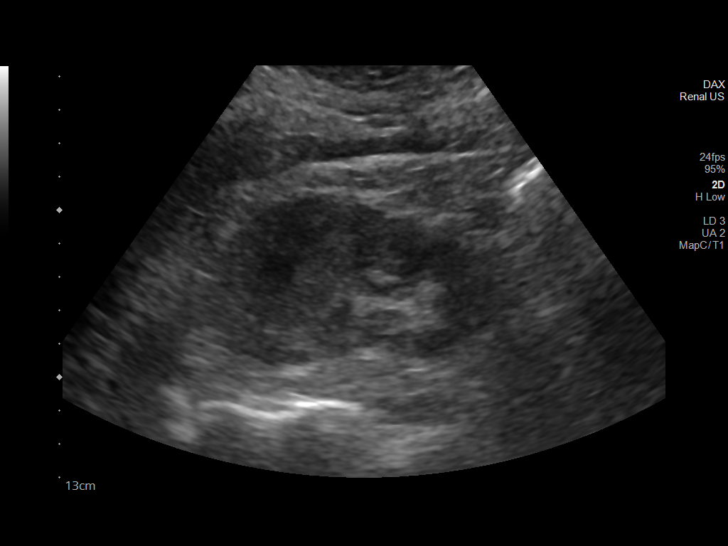
[im 50/60]
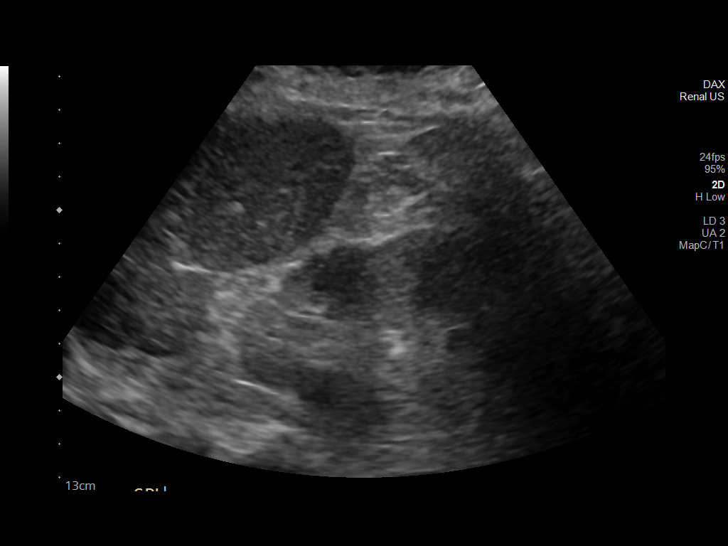
[im 55/60]
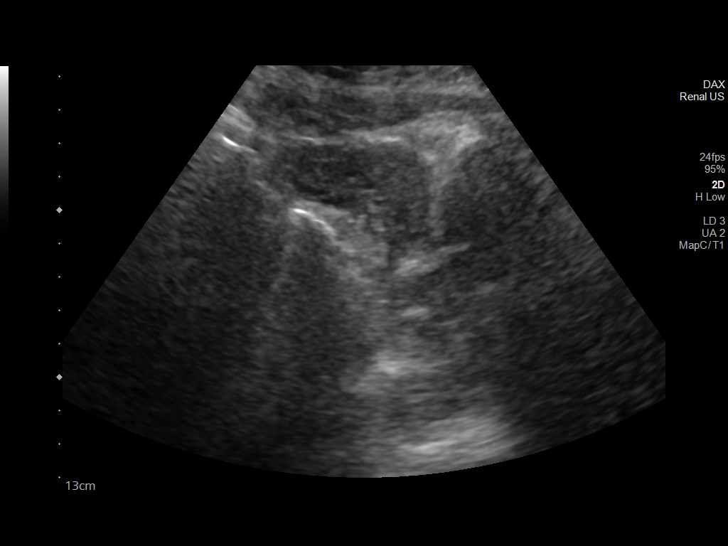
[im 60/60]
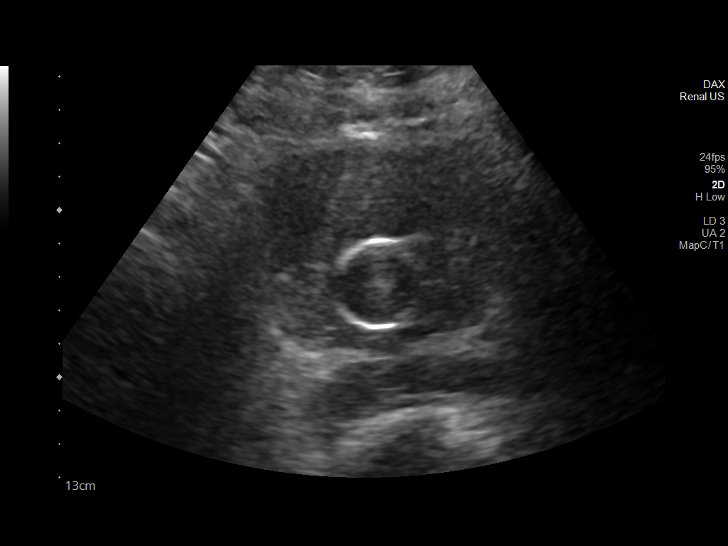

[14 of 25 positions shown; findings below may reference images not displayed]

FINDINGS: Right Kidney:

Renal measurements: 11.7 x 6.0 x 4.8 cm = volume: 175 mL. Renal
cortical echogenicity is diffusely mildly increased, in keeping with
changes of underlying medical renal disease, unchanged from prior
examination. Cortical thickness is preserved. No hydronephrosis. No
intrarenal masses or calcifications are seen.

Left Kidney:

Renal measurements: 11.4 x 5.1 x 5.3 cm = volume: 160 mL. Similarly,
renal cortical echogenicity is mildly increased, similar to prior
examination, in keeping with underlying medical renal disease.
Cortical thickness is preserved. No hydronephrosis. No intrarenal
masses or calcifications are seen.

Bladder:

Foley catheter balloon is seen within a decompressed bladder lumen.

Other:

None.
IMPRESSION: Stable mild cortical hyperechogenicity in keeping with underlying
medical renal disease. No hydronephrosis.

## 2021-11-15 ENCOUNTER — Encounter: Payer: Self-pay | Admitting: Interventional Cardiology

## 2021-11-15 ENCOUNTER — Ambulatory Visit (INDEPENDENT_AMBULATORY_CARE_PROVIDER_SITE_OTHER): Payer: Medicare Other | Admitting: Interventional Cardiology

## 2021-11-15 VITALS — BP 176/100 | HR 69 | Ht 70.0 in | Wt 260.0 lb

## 2021-11-15 DIAGNOSIS — I252 Old myocardial infarction: Secondary | ICD-10-CM

## 2021-11-15 DIAGNOSIS — I25118 Atherosclerotic heart disease of native coronary artery with other forms of angina pectoris: Secondary | ICD-10-CM | POA: Diagnosis not present

## 2021-11-15 DIAGNOSIS — N184 Chronic kidney disease, stage 4 (severe): Secondary | ICD-10-CM

## 2021-11-15 DIAGNOSIS — Z794 Long term (current) use of insulin: Secondary | ICD-10-CM

## 2021-11-15 DIAGNOSIS — E1159 Type 2 diabetes mellitus with other circulatory complications: Secondary | ICD-10-CM

## 2021-11-15 DIAGNOSIS — I10 Essential (primary) hypertension: Secondary | ICD-10-CM

## 2021-11-15 DIAGNOSIS — E785 Hyperlipidemia, unspecified: Secondary | ICD-10-CM

## 2021-11-15 MED ORDER — CLOPIDOGREL BISULFATE 75 MG PO TABS: 75.0000 mg | ORAL_TABLET | Freq: Every day | ORAL | 3 refills | Status: DC

## 2021-11-15 MED ORDER — CARVEDILOL 6.25 MG PO TABS: 6.2500 mg | ORAL_TABLET | Freq: Two times a day (BID) | ORAL | 3 refills | Status: DC

## 2021-11-15 MED ORDER — EZETIMIBE 10 MG PO TABS: 10.0000 mg | ORAL_TABLET | Freq: Every day | ORAL | 3 refills | Status: DC

## 2021-11-15 NOTE — Progress Notes (Signed)
Cardiology Office Note   Date:  11/15/2021   ID:  OTHA MONICAL, DOB 1965/05/22, MRN [id number]  PCP:  [name], NP    No chief complaint on file.  CAD  Wt Readings from Last 3 Encounters:  11/15/21 260 lb (117.9 kg)  10/19/21 281 lb 12 oz (127.8 kg)  08/22/21 265 lb (120.2 kg)       History of Present Illness: Keith [name] is a 56 y.o. male  with CAD.  Most recent cath in 2021 showed: "Mid Cx lesion is 60% stenosed. 1st Mrg-1 lesion is 95% stenosed. Previously placed 1st Mrg-2 stent (unknown type) is widely patent. Dist LAD-1 lesion is 80% stenosed. Dist LAD-2 lesion is 90% stenosed. Dist LAD-3 lesion is 90% stenosed. Dist RCA lesion is 50% stenosed. The left ventricular systolic function is normal. LV end diastolic pressure is normal. The left ventricular ejection fraction is 50-55% by visual estimate.  Successful OM1 ostial PCI drug-eluting stenting in the setting of inferolateral STEMI.  The patient was asymptomatic upon presentation and throughout the case.  There was TIMI-2 flow initially and TIMI-3 flow at the end of the case.  This was the only lesion that could potentially be implicated with the EKG changes.  The RCA was widely patent.  It did have an anterior takeoff.  The LAD had diffuse apical disease.  The EF appeared normal with hand-injection.  The patient will need dual antiplatelet therapy uninterrupted for at least 12 months. "  Has had worsening renal function.  Denies : Chest pain. Dizziness. Leg edema. Nitroglycerin use. Orthopnea. Palpitations. Paroxysmal nocturnal dyspnea. Shortness of breath. Syncope.     Past Medical History:  Diagnosis Date   Abscess of right foot    abscess/ulcer of R transtibial amputation requiring IV abx   AIDS ([place])    Anemia    CAD (coronary artery disease)    a. MI with stenting of OM1 in 11/2018 with residual disease. b. acute STEMI 03/2020 s/p DES to OM1   Chronic anemia    Chronic knee pain    right    Chronic pain    CKD (chronic kidney disease), stage IV (HCC)    CVA (cerebral vascular accident) ([place]) 04/2020   Diabetes type 2, uncontrolled    HgA1c 17.6 (04/27/2010)   Diabetic foot ulcer ([place]) 01/2017   right foot   Dilatation of aorta (HCC)    Erectile dysfunction    Genital warts    GERD (gastroesophageal reflux disease)    History of blood transfusion    HIV (human immunodeficiency virus infection) ([place]) 2009   CD4 count 100, VL 13800 (05/01/2010)   Hyperlipidemia    Hypertension    Myocardial infarction ([place])    Neuropathy    Noncompliance with medication regimen    Osteomyelitis ([place])    h/o hand   Osteomyelitis of metatarsal ([place]) 04/28/2017   Pneumonia     Past Surgical History:  Procedure Laterality Date   AMPUTATION Right 05/02/2017   Procedure: AMPUTATION TRANSMETARSAL;  Surgeon: [name], MD;  Location: [facility];  Service: Orthopedics;  Laterality: Right;   AMPUTATION Right 06/13/2017   Procedure: RIGHT BELOW KNEE AMPUTATION;  Surgeon: [name], MD;  Location: [facility];  Service: Orthopedics;  Laterality: Right;   AMPUTATION Left 10/27/2020   Procedure: LEFT BELOW KNEE AMPUTATION;  Surgeon: [name], MD;  Location: [place];  Service: Orthopedics;  Laterality: Left;   AMPUTATION Left 11/17/2020  Procedure: REVISION AMPUTATION BELOW KNEE, LEFT;  Surgeon: [name], MD;  Location: [facility];  Service: Orthopedics;  Laterality: Left;   AMPUTATION Left 01/03/2021   Procedure: LEFT ABOVE KNEE AMPUTATION;  Surgeon: [name], MD;  Location: [facility];  Service: Orthopedics;  Laterality: Left;   APPLICATION OF WOUND VAC Left 10/27/2020   Procedure: APPLICATION OF WOUND VAC;  Surgeon: [name], MD;  Location: [facility];  Service: Orthopedics;  Laterality: Left;   BELOW KNEE LEG AMPUTATION Right 06/13/2017   BELOW KNEE LEG AMPUTATION     CORONARY STENT INTERVENTION N/A 11/10/2018   Procedure: CORONARY STENT INTERVENTION;  Surgeon: Leonie Man, MD;   Location: [facility];  Service: Cardiovascular;  Laterality: N/A;   CORONARY/GRAFT ACUTE MI REVASCULARIZATION N/A 03/21/2020   Procedure: Coronary/Graft Acute MI Revascularization;  Surgeon: [name], MD;  Location: [facility];  Service: Cardiovascular;  Laterality: N/A;   CYSTOSCOPY N/A 11/09/2020   Procedure: CYSTOSCOPY, EVACUATION OF CLOTS, FULGERATION OF BLADDER AND BILATERIAL RETROGRADE PYELOGRAMS;  Surgeon: Janith Lima, MD;  Location: [facility];  Service: Urology;  Laterality: N/A;   HERNIA REPAIR     I & D EXTREMITY Left 08/21/2014   Procedure: INCISION AND DRAINAGE LEFT SMALL FINGER;  Surgeon: Leanora Cover, MD;  Location: [facility];  Service: Orthopedics;  Laterality: Left;   I & D EXTREMITY Right 03/18/2017   Procedure: IRRIGATION AND DEBRIDEMENT EXTREMITY;  Surgeon: [name], MD;  Location: [facility];  Service: Orthopedics;  Laterality: Right;   IR FLUORO GUIDE CV LINE RIGHT  11/02/2020   IR REMOVAL TUN CV CATH W/O FL  11/13/2020   IR US GUIDE VASC ACCESS RIGHT  11/02/2020   LEFT HEART CATH AND CORONARY ANGIOGRAPHY N/A 11/10/2018   Procedure: LEFT HEART CATH AND CORONARY ANGIOGRAPHY;  Surgeon: Leonie Man, MD;  Location: [facility];  Service: Cardiovascular;  Laterality: N/A;   LEFT HEART CATH AND CORONARY ANGIOGRAPHY N/A 03/21/2020   Procedure: LEFT HEART CATH AND CORONARY ANGIOGRAPHY;  Surgeon: [name], MD;  Location: [facility];  Service: Cardiovascular;  Laterality: N/A;   MINOR IRRIGATION AND DEBRIDEMENT OF WOUND Right 04/22/2014   Procedure: IRRIGATION AND DEBRIDEMENT OF RIGHT NECK ABCESS;  Surgeon: Jerrell Belfast, MD;  Location: [facility];  Service: ENT;  Laterality: Right;   MULTIPLE EXTRACTIONS WITH ALVEOLOPLASTY N/A 01/18/2013   Procedure: MULTIPLE EXTRACION 3, 6, 7, 10, 11, 13, 21, 22, 27, 28, 29, 30 WITH ALVEOLOPLASTY;  Surgeon: Gae Bon, DDS;  Location: [facility];  Service: Oral Surgery;  Laterality: N/A;   SKIN SPLIT GRAFT Right  03/21/2017   Procedure: IRRIGATION AND DEBRIDEMENT RIGHT FOOT AND APPLY SPLIT THICKNESS SKIN GRAFT AND WOUND VAC;  Surgeon: [name], MD;  Location: [facility];  Service: Orthopedics;  Laterality: Right;   STUMP REVISION Left 12/02/2020   Procedure: REVISION LEFT BELOW KNEE AMPUTATION;  Surgeon: [name], MD;  Location: [facility];  Service: Orthopedics;  Laterality: Left;   TEE WITHOUT CARDIOVERSION N/A 05/02/2017   Procedure: TRANSESOPHAGEAL ECHOCARDIOGRAM (TEE);  Surgeon: Thayer Headings, MD;  Location[facility];  Service: Cardiovascular;  Laterality: N/A;  coincidental to orthopedic case     Current Outpatient Medications  Medication Sig Dispense Refill   allopurinol (ZYLOPRIM) 100 MG tablet Take 1 tablet (100 mg total) by mouth daily. 30 tablet 1   atorvastatin (LIPITOR) 80 MG tablet Take 1 tablet (80 mg total) by  mouth daily. 30 tablet 1   carvedilol (COREG) 3.125 MG tablet Take 1 tablet (3.125 mg total) by mouth 2 (two) times daily. 60 tablet 1   cyclobenzaprine (FLEXERIL) 10 MG tablet Take 10 mg by mouth at bedtime.     dapsone 100 MG tablet TAKE 1 TABLET (100 MG TOTAL) BY MOUTH DAILY. 30 tablet 5   dolutegravir-lamiVUDine (DOVATO) 50-300 MG tablet Take 1 tablet by mouth daily. 30 tablet 5   doravirine (PIFELTRO) 100 MG TABS tablet Take 1 tablet (100 mg total) by mouth daily. 30 tablet 5   escitalopram (LEXAPRO) 20 MG tablet Take 1 tablet (20 mg total) by mouth daily. 30 tablet 3   ferrous sulfate 325 (65 FE) MG tablet Take 1 tablet (325 mg total) by mouth every Monday, Wednesday, and Friday at 6 PM. Do not take with other medicines. 12 tablet 0   finasteride (PROSCAR) 5 MG tablet Take 5 mg by mouth daily.     gabapentin (NEURONTIN) 100 MG capsule Take 1 capsule (100 mg total) by mouth 3 (three) times daily. 90 capsule 1   icosapent Ethyl (VASCEPA) 1 g capsule TAKE 2 CAPSULES (2 G TOTAL) BY MOUTH TWO TIMES DAILY. 120 capsule 3   insulin aspart (NOVOLOG) 100 UNIT/ML injection Inject 5  Units into the skin 3 (three) times daily with meals. 10 mL 11   insulin aspart (NOVOLOG) 100 UNIT/ML injection CBG 70 - 120: 0 units  CBG 121 - 150: 2 units  CBG 151 - 200: 3 units  CBG 201 - 250: 5 units  CBG 251 - 300: 8 units  CBG 301 - 350: 11 units  CBG 351 - 400: 15 units 10 mL 11   insulin glargine-yfgn (SEMGLEE) 100 UNIT/ML injection Inject 0.2 mLs (20 Units total) into the skin daily. 10 mL 11   Lancets (ONETOUCH ULTRASOFT) lancets Use as instructed 100 each 12   mupirocin cream (BACTROBAN) 2 % Apply topically daily. 15 g 0   nitroGLYCERIN (NITROSTAT) 0.4 MG SL tablet Place 0.4 mg under the tongue every 5 (five) minutes as needed for chest pain.     Oxycodone HCl 10 MG TABS Take 10 mg by mouth every 4 (four) hours as needed.     pantoprazole (PROTONIX) 40 MG tablet Take 1 tablet (40 mg total) by mouth daily. 30 tablet 0   senna (SENOKOT) 8.6 MG TABS tablet Take 2 tablets (17.2 mg total) by mouth at bedtime. 120 tablet 0   sodium bicarbonate 650 MG tablet Take 1 tablet (650 mg total) by mouth 2 (two) times daily. 60 tablet 0   No current facility-administered medications for this visit.    Allergies:   Elemental sulfur and Sulfa antibiotics    Social History:  The patient  reports that he has never smoked. He has never used smokeless tobacco. He reports that he does not currently use alcohol. He reports that he does not use drugs.   Family History:  The patient's family history includes Arthritis in his father; Cancer (age of onset: 68) in his maternal grandmother; Depression in his paternal grandmother; Hypertension in his brother, father, and mother.    ROS:  Please see the history of present illness.   Otherwise, review of systems are positive for leg pain.   All other systems are reviewed and negative.    PHYSICAL EXAM: VS:  BP (!) 176/100   Pulse 69   Ht 5\' 10"  (1.778 m)   Wt 260 lb (117.9 kg)  SpO2 99%   BMI 37.31 kg/m  , BMI Body mass index is 37.31 kg/m. GEN:  Well nourished, well developed, in no acute distress HEENT: normal Neck: no JVD, carotid bruits, or masses Cardiac: RRR; no murmurs, rubs, or gallops,no edema  Respiratory:  clear to auscultation bilaterally, normal work of breathing GI: soft, nontender, nondistended, + BS MS: bilateral BKA Skin: warm and dry, no rash Neuro:  Strength and sensation are intact Psych: euthymic mood, full affect   EKG:   The ekg ordered 7/23 demonstrates NSR, no ST changes   Recent Labs: 10/11/2021: ALT 11 10/13/2021: Magnesium 2.1 10/17/2021: Hemoglobin 10.8; Platelets 262 10/19/2021: BUN 62; Creatinine, Ser 4.18; Potassium 4.8; Sodium 138   Lipid Panel    Component Value Date/Time   CHOL 175 08/17/2021 1215   CHOL 274 (H) 09/07/2018 1056   TRIG 134 08/17/2021 1215   HDL 45 08/17/2021 1215   HDL 29 (L) 09/07/2018 1056   CHOLHDL 3.9 08/17/2021 1215   VLDL 43 (H) 04/25/2020 2030   LDLCALC 105 (H) 08/17/2021 1215     Other studies Reviewed: Additional studies/ records that were reviewed today with results demonstrating: labs reviewed.   ASSESSMENT AND PLAN:  CAD/Old MI: No angina.  Not taking aspirin or clopidogrel.  Add back clopidogrel 75 mg daily. HTN: Will increase carvedilol to 6.25 mg twice a day to help with blood pressure control and hopefully this will help renal function. DM: A1C6.0 in May 2023.  Avoid processed foods.  Avoid sodas.  Eat a high-fiber diet. CRI, stage 4: Avoid NSAIDs.  Stay well-hydrated. Follows with renal.  Hyperlipidemia: LDL 105. Continue atorvastatin 80 mg daily.  Add Zetia 10 mg daily.  Will recheck cholesterol and liver tests in about 3 months.  If the cholesterol numbers stay high, would have to consider a PCSK9 inhibitor.   Current medicines are reviewed at length with the patient today.  The patient concerns regarding his medicines were addressed.  The following changes have been made:  Increase carvedilol 6.25 mg BID  Labs/ tests ordered today include:  Liver and lipids in 3 months No orders of the defined types were placed in this encounter.   Recommend 150 minutes/week of aerobic exercise Low fat, low carb, high fiber diet recommended  Disposition:   FU in 6 months   Signed, Larae Grooms, MD  11/15/2021 11:06 AM    [facility] [address], [place], [place]  [address] Phone: [phone]; Fax: [phone]

## 2021-11-15 NOTE — Patient Instructions (Signed)
Medication Instructions:  Your physician has recommended you make the following change in your medication: increase carvedilol to 6.25 mg by mouth twice daily. Start Clopidogrel 75 mg by mouth daily Start Ezetimibe 10 mg by mouth daily  *If you need a refill on your cardiac medications before your next appointment, please call your pharmacy*   Lab Work: Your physician recommends that you return for lab work on February 15, 2022.  Lipid and liver profiles.  This will be fasting.  The lab opens at 7:15 AM  If you have labs (blood work) drawn today and your tests are completely normal, you will receive your results only by: [facility] (if you have MyChart) OR A paper copy in the mail If you have any lab test that is abnormal or we need to change your treatment, we will call you to review the results.   Testing/Procedures: none   Follow-Up: At [facility], you and your health needs are our priority.  As part of our continuing mission to provide you with exceptional heart care, we have created designated Provider Care Teams.  These Care Teams include your primary Cardiologist (physician) and Advanced Practice Providers (APPs -  Physician Assistants and Nurse Practitioners) who all work together to provide you with the care you need, when you need it.  We recommend signing up for the patient portal called "MyChart".  Sign up information is provided on this After Visit Summary.  MyChart is used to connect with patients for Virtual Visits (Telemedicine).  Patients are able to view lab/test results, encounter notes, upcoming appointments, etc.  Non-urgent messages can be sent to your provider as well.   To learn more about what you can do with MyChart, go to [email or web address].    Your next appointment:   6 month(s)  The format for your next appointment:   In Person  Provider:   Larae Grooms, MD     Other Instructions    Important Information About Sugar

## 2021-11-20 ENCOUNTER — Emergency Department (HOSPITAL_COMMUNITY)
Admission: EM | Admit: 2021-11-20 | Discharge: 2021-11-21 | Disposition: A | Discharge status: Home / Self Care | Payer: Medicare Other | Attending: Emergency Medicine | Admitting: Emergency Medicine

## 2021-11-20 ENCOUNTER — Other Ambulatory Visit: Payer: Self-pay

## 2021-11-20 DIAGNOSIS — Z794 Long term (current) use of insulin: Secondary | ICD-10-CM | POA: Diagnosis not present

## 2021-11-20 DIAGNOSIS — R519 Headache, unspecified: Secondary | ICD-10-CM | POA: Insufficient documentation

## 2021-11-20 DIAGNOSIS — R197 Diarrhea, unspecified: Secondary | ICD-10-CM | POA: Diagnosis present

## 2021-11-20 LAB — BASIC METABOLIC PANEL
Anion gap: 8 (ref 5–15)
BUN: 47 mg/dL — ABNORMAL HIGH (ref 6–20)
CO2: 22 mmol/L (ref 22–32)
Calcium: 8.9 mg/dL (ref 8.9–10.3)
Chloride: 107 mmol/L (ref 98–111)
Creatinine, Ser: 4.02 mg/dL — ABNORMAL HIGH (ref 0.61–1.24)
GFR, Estimated: 17 mL/min — ABNORMAL LOW (ref >60)
Glucose, Bld: 174 mg/dL — ABNORMAL HIGH (ref 70–99)
Potassium: 4.7 mmol/L (ref 3.5–5.1)
Sodium: 137 mmol/L (ref 135–145)

## 2021-11-20 LAB — CBC WITH DIFFERENTIAL/PLATELET
Abs Immature Granulocytes: 0.02 10*3/uL (ref 0.00–0.07)
Basophils Absolute: 0 10*3/uL (ref 0.0–0.1)
Basophils Relative: 1 %
Eosinophils Absolute: 0.2 10*3/uL (ref 0.0–0.5)
Eosinophils Relative: 2 %
HCT: 35.7 % — ABNORMAL LOW (ref 39.0–52.0)
Hemoglobin: 11.3 g/dL — ABNORMAL LOW (ref 13.0–17.0)
Immature Granulocytes: 0 %
Lymphocytes Relative: 20 %
Lymphs Abs: 1.5 10*3/uL (ref 0.7–4.0)
MCH: 29.4 pg (ref 26.0–34.0)
MCHC: 31.7 g/dL (ref 30.0–36.0)
MCV: 92.7 fL (ref 80.0–100.0)
Monocytes Absolute: 0.5 10*3/uL (ref 0.1–1.0)
Monocytes Relative: 7 %
Neutro Abs: 5 10*3/uL (ref 1.7–7.7)
Neutrophils Relative %: 70 %
Platelets: 266 10*3/uL (ref 150–400)
RBC: 3.85 MIL/uL — ABNORMAL LOW (ref 4.22–5.81)
RDW: 14.4 % (ref 11.5–15.5)
WBC: 7.2 10*3/uL (ref 4.0–10.5)
nRBC: 0.4 % — ABNORMAL HIGH (ref 0.0–0.2)

## 2021-11-20 NOTE — ED Triage Notes (Signed)
Pt with diarrhea and headache since Sunday  No abdominal pain. No urinary symptoms.  Pt does smell strongly of urine at time of triage.

## 2021-11-20 NOTE — ED Provider Triage Note (Signed)
Emergency Medicine Provider Triage Evaluation Note  Keith [name] , a 56 y.o. male  was evaluated in triage.  Pt complains of headache, diarrhea.  Denies abdominal pain, he smells very strongly of urine but denies any dysuria.  States he has a hard time getting to the bathroom..  Review of Systems  Per HPI  Physical Exam  BP 123/78 (BP Location: Right Arm)   Pulse 78   Temp 98.5 F (36.9 C) (Oral)   Resp 16   SpO2 96%  Gen:   Awake, no distress . Disheveled  Resp:  Normal effort  MSK:   Moves extremities without difficulty  Other:    Medical Decision Making  Medically screening exam initiated at 6:15 PM.  Appropriate orders placed.  Keith [name] was informed that the remainder of the evaluation will be completed by another provider, this initial triage assessment does not replace that evaluation, and the importance of remaining in the ED until their evaluation is complete.     Sherrill Raring, PA-C 11/20/21 0865

## 2021-11-21 DIAGNOSIS — R197 Diarrhea, unspecified: Secondary | ICD-10-CM | POA: Diagnosis not present

## 2021-11-21 LAB — URINALYSIS, ROUTINE W REFLEX MICROSCOPIC
Bilirubin Urine: NEGATIVE
Glucose, UA: 150 mg/dL — AB
Ketones, ur: NEGATIVE mg/dL
Nitrite: NEGATIVE
Protein, ur: >=300 mg/dL — AB
Specific Gravity, Urine: 1.014 (ref 1.005–1.030)
WBC, UA: >50 WBC/hpf — ABNORMAL HIGH (ref 0–5)
pH: 5 (ref 5.0–8.0)

## 2021-11-21 MED ORDER — LOPERAMIDE HCL 2 MG PO CAPS
4.0000 mg | ORAL_CAPSULE | ORAL | Status: DC | PRN
  Administered 2021-11-21: 4 mg via ORAL
  Filled 2021-11-21: qty 2

## 2021-11-21 MED ORDER — LOPERAMIDE HCL 2 MG PO CAPS: 2.0000 mg | ORAL_CAPSULE | Freq: Four times a day (QID) | ORAL | 0 refills | Status: DC | PRN

## 2021-11-21 NOTE — ED Notes (Signed)
Patient provided urine specimen cup for sample.

## 2021-11-21 NOTE — Discharge Instructions (Signed)
You may use the medication for diarrhea as needed.  This will be something that you use after each episode of diarrhea. Tylenol may be used for headaches. It is very important that you stay hydrated as well.  Read the information about diarrhea attached to these papers and slowly advance your diet from bland foods back to normal.   You will see your covid results online and please call your PCP for follow-up within the next week.

## 2021-11-21 NOTE — ED Provider Notes (Signed)
[place] EMERGENCY DEPARTMENT Provider Note   CSN: [id number] Arrival date & time: 11/20/21  1700     History  Chief Complaint  Patient presents with   Diarrhea   Headache    Keith [name] is a 56 y.o. male with a past medical history of chronic pain, CKD, hypertension and stage IV chronic kidney disease presenting today due to headache and diarrhea.  He says these of been going on since Sunday.  He says that the headache has resolved but that he is having around 6 episodes of diarrhea a day.  No changes in his diet, recent travel or antibiotics.  No abdominal pain, nausea or vomiting.  No lightheadedness or presyncope.   Diarrhea Associated symptoms: headaches   Headache Associated symptoms: diarrhea        Home Medications Prior to Admission medications   Medication Sig Start Date End Date Taking? Authorizing Provider  allopurinol (ZYLOPRIM) 100 MG tablet Take 1 tablet (100 mg total) by mouth daily. 12/26/20 11/15/21  [name], MD  atorvastatin (LIPITOR) 80 MG tablet Take 1 tablet (80 mg total) by mouth daily. 09/01/20 01/02/22  [name], MD  carvedilol (COREG) 6.25 MG tablet Take 1 tablet (6.25 mg total) by mouth 2 (two) times daily. 11/15/21   Jettie Booze, MD  clopidogrel (PLAVIX) 75 MG tablet Take 1 tablet (75 mg total) by mouth daily. 11/15/21   Jettie Booze, MD  cyclobenzaprine (FLEXERIL) 10 MG tablet Take 10 mg by mouth at bedtime. 09/27/21   [provider]  dapsone 100 MG tablet TAKE 1 TABLET (100 MG TOTAL) BY MOUTH DAILY. 02/13/21 02/13/22  Golden Circle, FNP  dolutegravir-lamiVUDine (DOVATO) 50-300 MG tablet Take 1 tablet by mouth daily. 02/13/21   Golden Circle, FNP  doravirine (PIFELTRO) 100 MG TABS tablet Take 1 tablet (100 mg total) by mouth daily. 02/13/21   Golden Circle, FNP  escitalopram (LEXAPRO) 20 MG tablet Take 1 tablet (20 mg total) by mouth daily. 10/19/21   Hosie Poisson, MD  ezetimibe  (ZETIA) 10 MG tablet Take 1 tablet (10 mg total) by mouth daily. 11/15/21   Jettie Booze, MD  ferrous sulfate 325 (65 FE) MG tablet Take 1 tablet (325 mg total) by mouth every Monday, Wednesday, and Friday at 6 PM. Do not take with other medicines. 10/19/21 11/18/21  Hosie Poisson, MD  finasteride (PROSCAR) 5 MG tablet Take 5 mg by mouth daily. 08/20/21   [provider]  gabapentin (NEURONTIN) 100 MG capsule Take 1 capsule (100 mg total) by mouth 3 (three) times daily. 10/19/21 12/18/21  Hosie Poisson, MD  icosapent Ethyl (VASCEPA) 1 g capsule TAKE 2 CAPSULES (2 G TOTAL) BY MOUTH TWO TIMES DAILY. 03/27/20 11/15/21  [name], MD  insulin aspart (NOVOLOG) 100 UNIT/ML injection Inject 5 Units into the skin 3 (three) times daily with meals. 10/19/21   Hosie Poisson, MD  insulin aspart (NOVOLOG) 100 UNIT/ML injection CBG 70 - 120: 0 units  CBG 121 - 150: 2 units  CBG 151 - 200: 3 units  CBG 201 - 250: 5 units  CBG 251 - 300: 8 units  CBG 301 - 350: 11 units  CBG 351 - 400: 15 units 10/19/21   Hosie Poisson, MD  insulin glargine-yfgn (SEMGLEE) 100 UNIT/ML injection Inject 0.2 mLs (20 Units total) into the skin daily. 10/20/21   Hosie Poisson, MD  Lancets [facility] ULTRASOFT) lancets Use as instructed 02/16/20   Kathe Becton  M, FNP  mupirocin cream (BACTROBAN) 2 % Apply topically daily. 10/20/21   Hosie Poisson, MD  nitroGLYCERIN (NITROSTAT) 0.4 MG SL tablet Place 0.4 mg under the tongue every 5 (five) minutes as needed for chest pain.    [provider]  Oxycodone HCl 10 MG TABS Take 10 mg by mouth every 4 (four) hours as needed. 10/31/21   [provider]  pantoprazole (PROTONIX) 40 MG tablet Take 1 tablet (40 mg total) by mouth daily. 10/19/21   Hosie Poisson, MD  senna (SENOKOT) 8.6 MG TABS tablet Take 2 tablets (17.2 mg total) by mouth at bedtime. 10/19/21   Hosie Poisson, MD  sodium bicarbonate 650 MG tablet Take 1 tablet (650 mg total) by mouth 2 (two) times  daily. 10/19/21   Hosie Poisson, MD      Allergies    Elemental sulfur and Sulfa antibiotics    Review of Systems   Review of Systems  Gastrointestinal:  Positive for diarrhea.  Neurological:  Positive for headaches.    Physical Exam Updated Vital Signs BP (!) 152/97 (BP Location: Left Arm)   Pulse 81   Temp 97.9 F (36.6 C)   Resp 14   SpO2 99%  Physical Exam Vitals and nursing note reviewed.  Constitutional:      General: He is not in acute distress.    Appearance: Normal appearance. He is not ill-appearing.  HENT:     Head: Normocephalic and atraumatic.  Eyes:     General: No visual field deficit or scleral icterus.    Conjunctiva/sclera: Conjunctivae normal.  Pulmonary:     Effort: Pulmonary effort is normal. No respiratory distress.  Abdominal:     Palpations: Abdomen is soft.     Tenderness: There is no abdominal tenderness.  Skin:    General: Skin is warm and dry.     Findings: No rash.  Neurological:     Mental Status: He is alert.     Cranial Nerves: No cranial nerve deficit, dysarthria or facial asymmetry.     Motor: No weakness.  Psychiatric:        Mood and Affect: Mood normal.        Behavior: Behavior normal.     ED Results / Procedures / Treatments   Labs (all labs ordered are listed, but only abnormal results are displayed) Labs Reviewed  CBC WITH DIFFERENTIAL/PLATELET - Abnormal; Notable for the following components:      Result Value   RBC 3.85 (*)    Hemoglobin 11.3 (*)    HCT 35.7 (*)    nRBC 0.4 (*)    All other components within normal limits  BASIC METABOLIC PANEL - Abnormal; Notable for the following components:   Glucose, Bld 174 (*)    BUN 47 (*)    Creatinine, Ser 4.02 (*)    GFR, Estimated 17 (*)    All other components within normal limits  RESP PANEL BY RT-PCR (FLU A&B, COVID) ARPGX2  URINALYSIS, ROUTINE W REFLEX MICROSCOPIC  CBG MONITORING, ED    EKG None  Radiology No results found.  Procedures Procedures    Medications Ordered in ED Medications  loperamide (IMODIUM) capsule 4 mg (has no administration in time range)    ED Course/ Medical Decision Making/ A&P                           Medical Decision Making Risk Prescription drug management.   56 year old male presenting with  headaches and diarrhea since Monday.  Differential includes but is not limited to viral illness such as COVID or flu, food poisoning, migraine, gastroenteritis and colitis.  Chart review: Patient was discharged from the hospital in early July for hyperglycemia.  He has multiple visits in admissions for hyperglycemia within the last year.  Today he tells me that his blood sugars have been doing better and that he is getting his kidney function under control.  Labs: Ordered in triage, viewed and interpreted by me today.  There are no pertinent findings, all labs appear to be at baseline.  Creatinine of 4 is around patient's baseline.  Glucose 174, no anion gap or acidosis. UA with large amounts of WBCs and bacteria.  I suspect this to be contaminant from his brief.  Treatment: No longer has a headache so I will defer treatment for this.  Does continue to have episodes of diarrhea.  Imodium ordered  MDM/disposition: This is a 56 year old male with a past medical history of uncontrolled diabetes and chronic kidney disease presenting today due to headache and diarrhea since "Sunday.  Says that the headache has resolved but that he is still having episodes of diarrhea.  Lab work without electrolyte abnormalities.  Urinalysis with WBC and bacteria however I suspect this is a contaminant from his brief use.  Proteinuria consistent with his kidney disease.  Hemodynamically stable, no signs of hypotension.  Elevated blood pressure which she reports he takes medications for that he has not had since he has been in the department for 20 hours.  COVID test pending at this time.  It will not change our course of treatment being that he  has been having symptoms for multiple days.  Also not a good candidate for antiviral treatment due to his kidney function.  I considered admission for further work-up, especially given patient's comorbidities however he is well with.  Believe it is reasonable to discharge home with Imodium.  He is agreeable to this plan.   Final Clinical Impression(s) / ED Diagnoses Final diagnoses:  Diarrhea, unspecified type    Rx / DC Orders ED Discharge Orders          Ordered    loperamide (IMODIUM) 2 MG capsule  4 times daily PRN        08" /16/23 1310           Results and diagnoses were explained to the patient. Return precautions discussed in full. Patient had no additional questions and expressed complete understanding.   This chart was dictated using voice recognition software.  Despite best efforts to proofread,  errors can occur which can change the documentation meaning.    Rhae Hammock, PA-C 45/03/88 [id number]    Lianne Cure, DO 03/49/17 1559

## 2021-12-05 ENCOUNTER — Inpatient Hospital Stay (HOSPITAL_COMMUNITY)
Admission: EM | Admit: 2021-12-05 | Discharge: 2021-12-09 | DRG: 638 | Disposition: A | Discharge status: Home / Self Care | Payer: Medicare Other | Attending: Internal Medicine | Admitting: Internal Medicine

## 2021-12-05 DIAGNOSIS — B20 Human immunodeficiency virus [HIV] disease: Secondary | ICD-10-CM | POA: Diagnosis present

## 2021-12-05 DIAGNOSIS — Z8249 Family history of ischemic heart disease and other diseases of the circulatory system: Secondary | ICD-10-CM

## 2021-12-05 DIAGNOSIS — Z91199 Patient's noncompliance with other medical treatment and regimen due to unspecified reason: Secondary | ICD-10-CM

## 2021-12-05 DIAGNOSIS — I2510 Atherosclerotic heart disease of native coronary artery without angina pectoris: Secondary | ICD-10-CM | POA: Diagnosis present

## 2021-12-05 DIAGNOSIS — I129 Hypertensive chronic kidney disease with stage 1 through stage 4 chronic kidney disease, or unspecified chronic kidney disease: Secondary | ICD-10-CM | POA: Diagnosis present

## 2021-12-05 DIAGNOSIS — Z881 Allergy status to other antibiotic agents status: Secondary | ICD-10-CM

## 2021-12-05 DIAGNOSIS — E1100 Type 2 diabetes mellitus with hyperosmolarity without nonketotic hyperglycemic-hyperosmolar coma (NKHHC): Principal | ICD-10-CM | POA: Diagnosis present

## 2021-12-05 DIAGNOSIS — K5900 Constipation, unspecified: Secondary | ICD-10-CM | POA: Diagnosis present

## 2021-12-05 DIAGNOSIS — Z79899 Other long term (current) drug therapy: Secondary | ICD-10-CM

## 2021-12-05 DIAGNOSIS — I252 Old myocardial infarction: Secondary | ICD-10-CM

## 2021-12-05 DIAGNOSIS — E785 Hyperlipidemia, unspecified: Secondary | ICD-10-CM | POA: Diagnosis present

## 2021-12-05 DIAGNOSIS — Z6837 Body mass index (BMI) 37.0-37.9, adult: Secondary | ICD-10-CM

## 2021-12-05 DIAGNOSIS — Z794 Long term (current) use of insulin: Secondary | ICD-10-CM

## 2021-12-05 DIAGNOSIS — Z818 Family history of other mental and behavioral disorders: Secondary | ICD-10-CM

## 2021-12-05 DIAGNOSIS — Z89512 Acquired absence of left leg below knee: Secondary | ICD-10-CM

## 2021-12-05 DIAGNOSIS — Z7902 Long term (current) use of antithrombotics/antiplatelets: Secondary | ICD-10-CM

## 2021-12-05 DIAGNOSIS — Z955 Presence of coronary angioplasty implant and graft: Secondary | ICD-10-CM

## 2021-12-05 DIAGNOSIS — Z809 Family history of malignant neoplasm, unspecified: Secondary | ICD-10-CM

## 2021-12-05 DIAGNOSIS — Z89511 Acquired absence of right leg below knee: Secondary | ICD-10-CM

## 2021-12-05 DIAGNOSIS — R739 Hyperglycemia, unspecified: Secondary | ICD-10-CM | POA: Diagnosis not present

## 2021-12-05 DIAGNOSIS — E1142 Type 2 diabetes mellitus with diabetic polyneuropathy: Secondary | ICD-10-CM | POA: Diagnosis present

## 2021-12-05 DIAGNOSIS — Z8261 Family history of arthritis: Secondary | ICD-10-CM

## 2021-12-05 DIAGNOSIS — I10 Essential (primary) hypertension: Secondary | ICD-10-CM | POA: Diagnosis present

## 2021-12-05 DIAGNOSIS — Z8673 Personal history of transient ischemic attack (TIA), and cerebral infarction without residual deficits: Secondary | ICD-10-CM

## 2021-12-05 DIAGNOSIS — K219 Gastro-esophageal reflux disease without esophagitis: Secondary | ICD-10-CM | POA: Diagnosis present

## 2021-12-05 DIAGNOSIS — Z89612 Acquired absence of left leg above knee: Secondary | ICD-10-CM

## 2021-12-05 DIAGNOSIS — N189 Chronic kidney disease, unspecified: Secondary | ICD-10-CM

## 2021-12-05 DIAGNOSIS — E1122 Type 2 diabetes mellitus with diabetic chronic kidney disease: Secondary | ICD-10-CM | POA: Diagnosis present

## 2021-12-05 DIAGNOSIS — Z882 Allergy status to sulfonamides status: Secondary | ICD-10-CM

## 2021-12-05 DIAGNOSIS — N184 Chronic kidney disease, stage 4 (severe): Secondary | ICD-10-CM | POA: Diagnosis present

## 2021-12-05 DIAGNOSIS — R197 Diarrhea, unspecified: Secondary | ICD-10-CM

## 2021-12-05 MED ORDER — SODIUM CHLORIDE 0.9 % IV BOLUS
500.0000 mL | Freq: Once | INTRAVENOUS | Status: AC
  Administered 2021-12-06: 500 mL via INTRAVENOUS

## 2021-12-05 NOTE — ED Triage Notes (Signed)
Pt via [facility] from car, pt states he knew his CBG was up "all day;" EMS CBG read "HIGH." Pt reports multiple episodes of diarrhea today. 20g R hand, 458mL fluids PTA  152/97 HR 90 98%

## 2021-12-05 NOTE — ED Provider Notes (Signed)
[place] EMERGENCY DEPARTMENT Provider Note   CSN: [id number] Arrival date & time: 12/05/21  2250     History {Add pertinent medical, surgical, social history, OB history to HPI:1} Chief Complaint  Patient presents with   Hyperglycemia    Keith [name] is a 56 y.o. male with a history of HIV/AIDS, T2DM, hypertension, hyperlipidemia, CAD, noncompliance, and CKD who presents to the ED with complaints of hyperglycemia. Patient reports he has not felt well with mild gradual onset steady progression headache that is similar to prior with polydipsia/uria throughout the day, states he was concerned his sugar was high, he has been giving himself his short acting insulin. He has also been having 4-5 episodes of diarrhea per day over the past 2 days. Given he felt poorly he called EMS, their CBG read as "high". 400 cc of fluid administered en route. He denies fever, chills, N/V, abdominal pain, chest pain, or dyspnea.   HPI     Home Medications Prior to Admission medications   Medication Sig Start Date End Date Taking? Authorizing Provider  allopurinol (ZYLOPRIM) 100 MG tablet Take 1 tablet (100 mg total) by mouth daily. 12/26/20 11/15/21  [name], MD  atorvastatin (LIPITOR) 80 MG tablet Take 1 tablet (80 mg total) by mouth daily. 09/01/20 01/02/22  [name], MD  carvedilol (COREG) 6.25 MG tablet Take 1 tablet (6.25 mg total) by mouth 2 (two) times daily. 11/15/21   Jettie Booze, MD  clopidogrel (PLAVIX) 75 MG tablet Take 1 tablet (75 mg total) by mouth daily. 11/15/21   Jettie Booze, MD  cyclobenzaprine (FLEXERIL) 10 MG tablet Take 10 mg by mouth at bedtime. 09/27/21   [provider]  dapsone 100 MG tablet TAKE 1 TABLET (100 MG TOTAL) BY MOUTH DAILY. 02/13/21 02/13/22  Golden Circle, FNP  dolutegravir-lamiVUDine (DOVATO) 50-300 MG tablet Take 1 tablet by mouth daily. 02/13/21   Golden Circle, FNP  doravirine (PIFELTRO) 100 MG TABS  tablet Take 1 tablet (100 mg total) by mouth daily. 02/13/21   Golden Circle, FNP  escitalopram (LEXAPRO) 20 MG tablet Take 1 tablet (20 mg total) by mouth daily. 10/19/21   Hosie Poisson, MD  ezetimibe (ZETIA) 10 MG tablet Take 1 tablet (10 mg total) by mouth daily. 11/15/21   Jettie Booze, MD  ferrous sulfate 325 (65 FE) MG tablet Take 1 tablet (325 mg total) by mouth every Monday, Wednesday, and Friday at 6 PM. Do not take with other medicines. 10/19/21 11/18/21  Hosie Poisson, MD  finasteride (PROSCAR) 5 MG tablet Take 5 mg by mouth daily. 08/20/21   [provider]  gabapentin (NEURONTIN) 100 MG capsule Take 1 capsule (100 mg total) by mouth 3 (three) times daily. 10/19/21 12/18/21  Hosie Poisson, MD  icosapent Ethyl (VASCEPA) 1 g capsule TAKE 2 CAPSULES (2 G TOTAL) BY MOUTH TWO TIMES DAILY. 03/27/20 11/15/21  [name], MD  insulin aspart (NOVOLOG) 100 UNIT/ML injection Inject 5 Units into the skin 3 (three) times daily with meals. 10/19/21   Hosie Poisson, MD  insulin aspart (NOVOLOG) 100 UNIT/ML injection CBG 70 - 120: 0 units  CBG 121 - 150: 2 units  CBG 151 - 200: 3 units  CBG 201 - 250: 5 units  CBG 251 - 300: 8 units  CBG 301 - 350: 11 units  CBG 351 - 400: 15 units 10/19/21   Hosie Poisson, MD  insulin glargine-yfgn (SEMGLEE) 100 UNIT/ML injection Inject 0.2  mLs (20 Units total) into the skin daily. 10/20/21   Hosie Poisson, MD  Lancets [facility] ULTRASOFT) lancets Use as instructed 02/16/20   [name], FNP  loperamide (IMODIUM) 2 MG capsule Take 1 capsule (2 mg total) by mouth 4 (four) times daily as needed for diarrhea or loose stools. 11/21/21   Redwine, Madison A, PA-C  mupirocin cream (BACTROBAN) 2 % Apply topically daily. 10/20/21   Hosie Poisson, MD  nitroGLYCERIN (NITROSTAT) 0.4 MG SL tablet Place 0.4 mg under the tongue every 5 (five) minutes as needed for chest pain.    [provider]  Oxycodone HCl 10 MG TABS Take 10 mg by mouth every 4  (four) hours as needed. 10/31/21   [provider]  pantoprazole (PROTONIX) 40 MG tablet Take 1 tablet (40 mg total) by mouth daily. 10/19/21   Hosie Poisson, MD  senna (SENOKOT) 8.6 MG TABS tablet Take 2 tablets (17.2 mg total) by mouth at bedtime. 10/19/21   Hosie Poisson, MD  sodium bicarbonate 650 MG tablet Take 1 tablet (650 mg total) by mouth 2 (two) times daily. 10/19/21   Hosie Poisson, MD      Allergies    Elemental sulfur and Sulfa antibiotics    Review of Systems   Review of Systems  Constitutional:  Negative for chills and fever.  Eyes:  Negative for visual disturbance.  Respiratory:  Negative for shortness of breath.   Cardiovascular:  Negative for chest pain.  Gastrointestinal:  Positive for diarrhea. Negative for abdominal pain, blood in stool, constipation, nausea and vomiting.  Endocrine: Positive for polydipsia and polyuria.  Genitourinary:  Negative for dysuria.  Neurological:  Positive for headaches. Negative for syncope, weakness and numbness.  All other systems reviewed and are negative.   Physical Exam Updated Vital Signs There were no vitals taken for this visit. Physical Exam Vitals and nursing note reviewed.  Constitutional:      General: He is not in acute distress.    Appearance: He is well-developed. He is not toxic-appearing.  HENT:     Head: Normocephalic and atraumatic.     Mouth/Throat:     Mouth: Mucous membranes are dry.  Eyes:     General:        Right eye: No discharge.        Left eye: No discharge.     Conjunctiva/sclera: Conjunctivae normal.  Cardiovascular:     Rate and Rhythm: Normal rate and regular rhythm.  Pulmonary:     Effort: No respiratory distress.     Breath sounds: Normal breath sounds. No wheezing or rales.  Abdominal:     General: There is no distension.     Palpations: Abdomen is soft.     Tenderness: There is no abdominal tenderness. There is no guarding or rebound.  Musculoskeletal:     Cervical back: Neck  supple. No rigidity.     Comments: Left AKA Right BKA  Skin:    General: Skin is warm and dry.  Neurological:     Mental Status: He is alert.     Comments: Clear speech. No facial droop. Sensation & strength grossly intact x 4.   Psychiatric:        Behavior: Behavior normal.     ED Results / Procedures / Treatments   Labs (all labs ordered are listed, but only abnormal results are displayed) Labs Reviewed - No data to display  EKG None  Radiology No results found.  Procedures Procedures  {Document cardiac  monitor, telemetry assessment procedure when appropriate:1}  Medications Ordered in ED Medications - No data to display  ED Course/ Medical Decision Making/ A&P                           Medical Decision Making  ***  {Document critical care time when appropriate:1} {Document review of labs and clinical decision tools ie heart score, Chads2Vasc2 etc:1}  {Document your independent review of radiology images, and any outside records:1} {Document your discussion with family members, caretakers, and with consultants:1} {Document social determinants of health affecting pt's care:1} {Document your decision making why or why not admission, treatments were needed:1} Final Clinical Impression(s) / ED Diagnoses Final diagnoses:  None    Rx / DC Orders ED Discharge Orders     None

## 2021-12-06 ENCOUNTER — Encounter (HOSPITAL_COMMUNITY): Payer: Self-pay | Admitting: Internal Medicine

## 2021-12-06 ENCOUNTER — Other Ambulatory Visit: Payer: Self-pay

## 2021-12-06 DIAGNOSIS — I10 Essential (primary) hypertension: Secondary | ICD-10-CM

## 2021-12-06 DIAGNOSIS — I2510 Atherosclerotic heart disease of native coronary artery without angina pectoris: Secondary | ICD-10-CM | POA: Diagnosis present

## 2021-12-06 DIAGNOSIS — Z89612 Acquired absence of left leg above knee: Secondary | ICD-10-CM

## 2021-12-06 DIAGNOSIS — Z89511 Acquired absence of right leg below knee: Secondary | ICD-10-CM

## 2021-12-06 DIAGNOSIS — R739 Hyperglycemia, unspecified: Principal | ICD-10-CM | POA: Diagnosis present

## 2021-12-06 DIAGNOSIS — R197 Diarrhea, unspecified: Secondary | ICD-10-CM

## 2021-12-06 DIAGNOSIS — E785 Hyperlipidemia, unspecified: Secondary | ICD-10-CM | POA: Diagnosis present

## 2021-12-06 DIAGNOSIS — Z91199 Patient's noncompliance with other medical treatment and regimen due to unspecified reason: Secondary | ICD-10-CM | POA: Diagnosis not present

## 2021-12-06 DIAGNOSIS — Z8249 Family history of ischemic heart disease and other diseases of the circulatory system: Secondary | ICD-10-CM | POA: Diagnosis not present

## 2021-12-06 DIAGNOSIS — Z79899 Other long term (current) drug therapy: Secondary | ICD-10-CM | POA: Diagnosis not present

## 2021-12-06 DIAGNOSIS — Z881 Allergy status to other antibiotic agents status: Secondary | ICD-10-CM | POA: Diagnosis not present

## 2021-12-06 DIAGNOSIS — E1100 Type 2 diabetes mellitus with hyperosmolarity without nonketotic hyperglycemic-hyperosmolar coma (NKHHC): Secondary | ICD-10-CM | POA: Diagnosis not present

## 2021-12-06 DIAGNOSIS — I129 Hypertensive chronic kidney disease with stage 1 through stage 4 chronic kidney disease, or unspecified chronic kidney disease: Secondary | ICD-10-CM | POA: Diagnosis present

## 2021-12-06 DIAGNOSIS — Z809 Family history of malignant neoplasm, unspecified: Secondary | ICD-10-CM | POA: Diagnosis not present

## 2021-12-06 DIAGNOSIS — K5900 Constipation, unspecified: Secondary | ICD-10-CM | POA: Diagnosis present

## 2021-12-06 DIAGNOSIS — Z818 Family history of other mental and behavioral disorders: Secondary | ICD-10-CM | POA: Diagnosis not present

## 2021-12-06 DIAGNOSIS — Z6837 Body mass index (BMI) 37.0-37.9, adult: Secondary | ICD-10-CM | POA: Diagnosis not present

## 2021-12-06 DIAGNOSIS — E1142 Type 2 diabetes mellitus with diabetic polyneuropathy: Secondary | ICD-10-CM | POA: Diagnosis present

## 2021-12-06 DIAGNOSIS — N184 Chronic kidney disease, stage 4 (severe): Secondary | ICD-10-CM | POA: Diagnosis not present

## 2021-12-06 DIAGNOSIS — I252 Old myocardial infarction: Secondary | ICD-10-CM | POA: Diagnosis not present

## 2021-12-06 DIAGNOSIS — Z794 Long term (current) use of insulin: Secondary | ICD-10-CM | POA: Diagnosis not present

## 2021-12-06 DIAGNOSIS — Z7902 Long term (current) use of antithrombotics/antiplatelets: Secondary | ICD-10-CM | POA: Diagnosis not present

## 2021-12-06 DIAGNOSIS — Z882 Allergy status to sulfonamides status: Secondary | ICD-10-CM | POA: Diagnosis not present

## 2021-12-06 DIAGNOSIS — N189 Chronic kidney disease, unspecified: Secondary | ICD-10-CM | POA: Diagnosis not present

## 2021-12-06 DIAGNOSIS — B20 Human immunodeficiency virus [HIV] disease: Secondary | ICD-10-CM | POA: Diagnosis present

## 2021-12-06 DIAGNOSIS — E1122 Type 2 diabetes mellitus with diabetic chronic kidney disease: Secondary | ICD-10-CM | POA: Diagnosis present

## 2021-12-06 DIAGNOSIS — Z89512 Acquired absence of left leg below knee: Secondary | ICD-10-CM | POA: Diagnosis not present

## 2021-12-06 LAB — I-STAT VENOUS BLOOD GAS, ED
Acid-base deficit: 5 mmol/L — ABNORMAL HIGH (ref 0.0–2.0)
Bicarbonate: 21.3 mmol/L (ref 20.0–28.0)
Calcium, Ion: 1.14 mmol/L — ABNORMAL LOW (ref 1.15–1.40)
HCT: 38 % — ABNORMAL LOW (ref 39.0–52.0)
Hemoglobin: 12.9 g/dL — ABNORMAL LOW (ref 13.0–17.0)
O2 Saturation: 95 %
Potassium: 4 mmol/L (ref 3.5–5.1)
Sodium: 128 mmol/L — ABNORMAL LOW (ref 135–145)
TCO2: 23 mmol/L (ref 22–32)
pCO2, Ven: 44.6 mmHg (ref 44–60)
pH, Ven: 7.287 (ref 7.25–7.43)
pO2, Ven: 87 mmHg — ABNORMAL HIGH (ref 32–45)

## 2021-12-06 LAB — CBC WITH DIFFERENTIAL/PLATELET
Abs Immature Granulocytes: 0.06 10*3/uL (ref 0.00–0.07)
Basophils Absolute: 0 10*3/uL (ref 0.0–0.1)
Basophils Relative: 0 %
Eosinophils Absolute: 0.2 10*3/uL (ref 0.0–0.5)
Eosinophils Relative: 1 %
HCT: 35 % — ABNORMAL LOW (ref 39.0–52.0)
Hemoglobin: 11.5 g/dL — ABNORMAL LOW (ref 13.0–17.0)
Immature Granulocytes: 1 %
Lymphocytes Relative: 13 %
Lymphs Abs: 1.4 10*3/uL (ref 0.7–4.0)
MCH: 29.2 pg (ref 26.0–34.0)
MCHC: 32.9 g/dL (ref 30.0–36.0)
MCV: 88.8 fL (ref 80.0–100.0)
Monocytes Absolute: 0.7 10*3/uL (ref 0.1–1.0)
Monocytes Relative: 6 %
Neutro Abs: 9 10*3/uL — ABNORMAL HIGH (ref 1.7–7.7)
Neutrophils Relative %: 79 %
Platelets: 257 10*3/uL (ref 150–400)
RBC: 3.94 MIL/uL — ABNORMAL LOW (ref 4.22–5.81)
RDW: 13.5 % (ref 11.5–15.5)
WBC: 11.4 10*3/uL — ABNORMAL HIGH (ref 4.0–10.5)
nRBC: 0 % (ref 0.0–0.2)

## 2021-12-06 LAB — BASIC METABOLIC PANEL
Anion gap: 9 (ref 5–15)
Anion gap: 12 (ref 5–15)
Anion gap: 8 (ref 5–15)
Anion gap: 16 — ABNORMAL HIGH (ref 5–15)
Anion gap: 10 (ref 5–15)
BUN: 40 mg/dL — ABNORMAL HIGH (ref 6–20)
BUN: 40 mg/dL — ABNORMAL HIGH (ref 6–20)
BUN: 38 mg/dL — ABNORMAL HIGH (ref 6–20)
BUN: 39 mg/dL — ABNORMAL HIGH (ref 6–20)
BUN: 39 mg/dL — ABNORMAL HIGH (ref 6–20)
CO2: 19 mmol/L — ABNORMAL LOW (ref 22–32)
CO2: 21 mmol/L — ABNORMAL LOW (ref 22–32)
CO2: 23 mmol/L (ref 22–32)
CO2: 17 mmol/L — ABNORMAL LOW (ref 22–32)
CO2: 18 mmol/L — ABNORMAL LOW (ref 22–32)
Calcium: 8.5 mg/dL — ABNORMAL LOW (ref 8.9–10.3)
Calcium: 9.1 mg/dL (ref 8.9–10.3)
Calcium: 9.1 mg/dL (ref 8.9–10.3)
Calcium: 9.3 mg/dL (ref 8.9–10.3)
Calcium: 8.7 mg/dL — ABNORMAL LOW (ref 8.9–10.3)
Chloride: 102 mmol/L (ref 98–111)
Chloride: 103 mmol/L (ref 98–111)
Chloride: 106 mmol/L (ref 98–111)
Chloride: 102 mmol/L (ref 98–111)
Chloride: 105 mmol/L (ref 98–111)
Creatinine, Ser: 3.89 mg/dL — ABNORMAL HIGH (ref 0.61–1.24)
Creatinine, Ser: 3.84 mg/dL — ABNORMAL HIGH (ref 0.61–1.24)
Creatinine, Ser: 3.68 mg/dL — ABNORMAL HIGH (ref 0.61–1.24)
Creatinine, Ser: 3.66 mg/dL — ABNORMAL HIGH (ref 0.61–1.24)
Creatinine, Ser: 3.76 mg/dL — ABNORMAL HIGH (ref 0.61–1.24)
GFR, Estimated: 17 mL/min — ABNORMAL LOW (ref >60)
GFR, Estimated: 17 mL/min — ABNORMAL LOW (ref >60)
GFR, Estimated: 18 mL/min — ABNORMAL LOW (ref >60)
GFR, Estimated: 19 mL/min — ABNORMAL LOW (ref >60)
GFR, Estimated: 18 mL/min — ABNORMAL LOW (ref >60)
Glucose, Bld: 514 mg/dL (ref 70–99)
Glucose, Bld: 252 mg/dL — ABNORMAL HIGH (ref 70–99)
Glucose, Bld: 123 mg/dL — ABNORMAL HIGH (ref 70–99)
Glucose, Bld: 292 mg/dL — ABNORMAL HIGH (ref 70–99)
Glucose, Bld: 294 mg/dL — ABNORMAL HIGH (ref 70–99)
Potassium: 3.6 mmol/L (ref 3.5–5.1)
Potassium: 3.3 mmol/L — ABNORMAL LOW (ref 3.5–5.1)
Potassium: 3.6 mmol/L (ref 3.5–5.1)
Potassium: 4.6 mmol/L (ref 3.5–5.1)
Potassium: 3.9 mmol/L (ref 3.5–5.1)
Sodium: 130 mmol/L — ABNORMAL LOW (ref 135–145)
Sodium: 136 mmol/L (ref 135–145)
Sodium: 137 mmol/L (ref 135–145)
Sodium: 135 mmol/L (ref 135–145)
Sodium: 133 mmol/L — ABNORMAL LOW (ref 135–145)

## 2021-12-06 LAB — URINALYSIS, ROUTINE W REFLEX MICROSCOPIC
Bilirubin Urine: NEGATIVE
Glucose, UA: >=500 mg/dL — AB
Ketones, ur: NEGATIVE mg/dL
Nitrite: NEGATIVE
Protein, ur: >=300 mg/dL — AB
Specific Gravity, Urine: 1.014 (ref 1.005–1.030)
WBC, UA: >50 WBC/hpf — ABNORMAL HIGH (ref 0–5)
pH: 5 (ref 5.0–8.0)

## 2021-12-06 LAB — GLUCOSE, CAPILLARY
Glucose-Capillary: 290 mg/dL — ABNORMAL HIGH (ref 70–99)
Glucose-Capillary: 283 mg/dL — ABNORMAL HIGH (ref 70–99)

## 2021-12-06 LAB — CBG MONITORING, ED
Glucose-Capillary: 125 mg/dL — ABNORMAL HIGH (ref 70–99)
Glucose-Capillary: 149 mg/dL — ABNORMAL HIGH (ref 70–99)
Glucose-Capillary: 163 mg/dL — ABNORMAL HIGH (ref 70–99)
Glucose-Capillary: 204 mg/dL — ABNORMAL HIGH (ref 70–99)
Glucose-Capillary: 214 mg/dL — ABNORMAL HIGH (ref 70–99)
Glucose-Capillary: 336 mg/dL — ABNORMAL HIGH (ref 70–99)
Glucose-Capillary: 424 mg/dL — ABNORMAL HIGH (ref 70–99)
Glucose-Capillary: 457 mg/dL — ABNORMAL HIGH (ref 70–99)
Glucose-Capillary: 508 mg/dL (ref 70–99)
Glucose-Capillary: 559 mg/dL (ref 70–99)
Glucose-Capillary: >600 mg/dL (ref 70–99)
Glucose-Capillary: >600 mg/dL (ref 70–99)

## 2021-12-06 LAB — COMPREHENSIVE METABOLIC PANEL
ALT: 15 U/L (ref 0–44)
AST: 20 U/L (ref 15–41)
Albumin: 2.7 g/dL — ABNORMAL LOW (ref 3.5–5.0)
Alkaline Phosphatase: 115 U/L (ref 38–126)
Anion gap: 9 (ref 5–15)
BUN: 43 mg/dL — ABNORMAL HIGH (ref 6–20)
CO2: 19 mmol/L — ABNORMAL LOW (ref 22–32)
Calcium: 8.1 mg/dL — ABNORMAL LOW (ref 8.9–10.3)
Chloride: 98 mmol/L (ref 98–111)
Creatinine, Ser: 3.8 mg/dL — ABNORMAL HIGH (ref 0.61–1.24)
GFR, Estimated: 18 mL/min — ABNORMAL LOW (ref >60)
Glucose, Bld: 725 mg/dL (ref 70–99)
Potassium: 4.7 mmol/L (ref 3.5–5.1)
Sodium: 126 mmol/L — ABNORMAL LOW (ref 135–145)
Total Bilirubin: 1.1 mg/dL (ref 0.3–1.2)
Total Protein: 6.7 g/dL (ref 6.5–8.1)

## 2021-12-06 LAB — BETA-HYDROXYBUTYRIC ACID: Beta-Hydroxybutyric Acid: 0.23 mmol/L (ref 0.05–0.27)

## 2021-12-06 MED ORDER — INSULIN REGULAR(HUMAN) IN NACL 100-0.9 UT/100ML-% IV SOLN
INTRAVENOUS | Status: DC
  Administered 2021-12-06: 11.5 [IU]/h via INTRAVENOUS
  Filled 2021-12-06: qty 100

## 2021-12-06 MED ORDER — LABETALOL HCL 5 MG/ML IV SOLN: 5.0000 mg | INTRAVENOUS | Status: DC | PRN

## 2021-12-06 MED ORDER — ATORVASTATIN CALCIUM 80 MG PO TABS
80.0000 mg | ORAL_TABLET | Freq: Every day | ORAL | Status: DC
  Administered 2021-12-06 – 2021-12-09 (×4): 80 mg via ORAL
  Filled 2021-12-06: qty 2
  Filled 2021-12-06 (×3): qty 1

## 2021-12-06 MED ORDER — INSULIN ASPART 100 UNIT/ML IJ SOLN
3.0000 [IU] | Freq: Three times a day (TID) | INTRAMUSCULAR | Status: DC
  Administered 2021-12-07 (×2): 3 [IU] via SUBCUTANEOUS

## 2021-12-06 MED ORDER — DORAVIRINE 100 MG PO TABS
100.0000 mg | ORAL_TABLET | Freq: Every day | ORAL | Status: DC
  Administered 2021-12-07 – 2021-12-09 (×3): 100 mg via ORAL
  Filled 2021-12-06 (×3): qty 1

## 2021-12-06 MED ORDER — SODIUM BICARBONATE 650 MG PO TABS
650.0000 mg | ORAL_TABLET | Freq: Two times a day (BID) | ORAL | Status: DC
  Administered 2021-12-06 – 2021-12-09 (×7): 650 mg via ORAL
  Filled 2021-12-06 (×7): qty 1

## 2021-12-06 MED ORDER — SODIUM CHLORIDE 0.9 % IV SOLN: INTRAVENOUS | Status: DC

## 2021-12-06 MED ORDER — HEPARIN SODIUM (PORCINE) 5000 UNIT/ML IJ SOLN
5000.0000 [IU] | Freq: Three times a day (TID) | INTRAMUSCULAR | Status: DC
  Administered 2021-12-06 – 2021-12-09 (×9): 5000 [IU] via SUBCUTANEOUS
  Filled 2021-12-06 (×9): qty 1

## 2021-12-06 MED ORDER — DOLUTEGRAVIR-LAMIVUDINE 50-300 MG PO TABS
1.0000 | ORAL_TABLET | Freq: Every day | ORAL | Status: DC
  Administered 2021-12-07 – 2021-12-09 (×3): 1 via ORAL
  Filled 2021-12-06 (×4): qty 1

## 2021-12-06 MED ORDER — CLOPIDOGREL BISULFATE 75 MG PO TABS
75.0000 mg | ORAL_TABLET | Freq: Every day | ORAL | Status: DC
  Administered 2021-12-06 – 2021-12-09 (×4): 75 mg via ORAL
  Filled 2021-12-06 (×4): qty 1

## 2021-12-06 MED ORDER — METOPROLOL TARTRATE 5 MG/5ML IV SOLN
2.5000 mg | Freq: Once | INTRAVENOUS | Status: AC
  Administered 2021-12-06: 2.5 mg via INTRAVENOUS
  Filled 2021-12-06: qty 5

## 2021-12-06 MED ORDER — INSULIN GLARGINE-YFGN 100 UNIT/ML ~~LOC~~ SOLN
15.0000 [IU] | Freq: Every day | SUBCUTANEOUS | Status: DC
  Filled 2021-12-06: qty 0.15

## 2021-12-06 MED ORDER — ACETAMINOPHEN 325 MG PO TABS
650.0000 mg | ORAL_TABLET | Freq: Four times a day (QID) | ORAL | Status: DC | PRN
  Administered 2021-12-06: 650 mg via ORAL
  Filled 2021-12-06: qty 2

## 2021-12-06 MED ORDER — ONDANSETRON HCL 4 MG PO TABS: 4.0000 mg | ORAL_TABLET | Freq: Four times a day (QID) | ORAL | Status: DC | PRN

## 2021-12-06 MED ORDER — ACETAMINOPHEN 650 MG RE SUPP: 650.0000 mg | Freq: Four times a day (QID) | RECTAL | Status: DC | PRN

## 2021-12-06 MED ORDER — DAPSONE 100 MG PO TABS
100.0000 mg | ORAL_TABLET | Freq: Every day | ORAL | Status: DC
  Administered 2021-12-07 – 2021-12-09 (×3): 100 mg via ORAL
  Filled 2021-12-06 (×3): qty 1

## 2021-12-06 MED ORDER — CARVEDILOL 6.25 MG PO TABS
6.2500 mg | ORAL_TABLET | Freq: Two times a day (BID) | ORAL | Status: DC
  Administered 2021-12-06 – 2021-12-09 (×6): 6.25 mg via ORAL
  Filled 2021-12-06 (×6): qty 1

## 2021-12-06 MED ORDER — LACTATED RINGERS IV SOLN: INTRAVENOUS | Status: DC

## 2021-12-06 MED ORDER — INSULIN ASPART 100 UNIT/ML IJ SOLN
0.0000 [IU] | Freq: Three times a day (TID) | INTRAMUSCULAR | Status: DC
  Administered 2021-12-06: 1 [IU] via SUBCUTANEOUS
  Administered 2021-12-06 – 2021-12-07 (×2): 5 [IU] via SUBCUTANEOUS
  Administered 2021-12-07 (×2): 9 [IU] via SUBCUTANEOUS
  Administered 2021-12-08: 2 [IU] via SUBCUTANEOUS
  Administered 2021-12-08: 7 [IU] via SUBCUTANEOUS
  Administered 2021-12-09: 5 [IU] via SUBCUTANEOUS
  Administered 2021-12-09: 7 [IU] via SUBCUTANEOUS

## 2021-12-06 MED ORDER — ONDANSETRON HCL 4 MG/2ML IJ SOLN: 4.0000 mg | Freq: Four times a day (QID) | INTRAMUSCULAR | Status: DC | PRN

## 2021-12-06 MED ORDER — HYDRALAZINE HCL 10 MG PO TABS
10.0000 mg | ORAL_TABLET | Freq: Three times a day (TID) | ORAL | Status: DC
  Administered 2021-12-06 – 2021-12-07 (×5): 10 mg via ORAL
  Filled 2021-12-06 (×5): qty 1

## 2021-12-06 MED ORDER — HYDRALAZINE HCL 20 MG/ML IJ SOLN
10.0000 mg | Freq: Four times a day (QID) | INTRAMUSCULAR | Status: DC | PRN
  Administered 2021-12-06: 10 mg via INTRAVENOUS
  Filled 2021-12-06: qty 1

## 2021-12-06 MED ORDER — DEXTROSE 50 % IV SOLN: 0.0000 mL | INTRAVENOUS | Status: DC | PRN

## 2021-12-06 MED ORDER — SODIUM CHLORIDE 0.9 % IV BOLUS
500.0000 mL | Freq: Once | INTRAVENOUS | Status: AC
  Administered 2021-12-06: 500 mL via INTRAVENOUS

## 2021-12-06 MED ORDER — DEXTROSE IN LACTATED RINGERS 5 % IV SOLN: INTRAVENOUS | Status: DC

## 2021-12-06 MED ORDER — INSULIN GLARGINE-YFGN 100 UNIT/ML ~~LOC~~ SOLN
20.0000 [IU] | Freq: Every day | SUBCUTANEOUS | Status: DC
  Administered 2021-12-06: 20 [IU] via SUBCUTANEOUS
  Filled 2021-12-06: qty 0.2

## 2021-12-06 MED ORDER — EZETIMIBE 10 MG PO TABS
10.0000 mg | ORAL_TABLET | Freq: Every day | ORAL | Status: DC
  Administered 2021-12-06 – 2021-12-09 (×4): 10 mg via ORAL
  Filled 2021-12-06 (×4): qty 1

## 2021-12-06 MED ORDER — INSULIN GLARGINE-YFGN 100 UNIT/ML ~~LOC~~ SOLN: 15.0000 [IU] | Freq: Every day | SUBCUTANEOUS | Status: DC

## 2021-12-06 MED ORDER — PANTOPRAZOLE SODIUM 40 MG PO TBEC
40.0000 mg | DELAYED_RELEASE_TABLET | Freq: Every day | ORAL | Status: DC
  Administered 2021-12-06 – 2021-12-09 (×4): 40 mg via ORAL
  Filled 2021-12-06 (×4): qty 1

## 2021-12-06 NOTE — Assessment & Plan Note (Signed)
Patient without nausea or vomiting.  Could be related to his HIV disease.  Check GI viral panel.

## 2021-12-06 NOTE — Progress Notes (Addendum)
Patient admitted this morning, detail please see HPI History of HIV disease, with CD 4 count of 115/VL130, supposed to be on dovato/doravirine and dapsone for OI, h/o HTN, HLD, IDDM2, CAD/STEMI, CKDV,bilateral amputee, presents with elevated blood sugars, and diarrhea x7 days, denies Abdo pain, no nausea vomiting, does not appear in DKA On insulin drip, hydration, stool sample pending collection, ua is concerning for uti, urine cutlure pending collection  Pm addendum: no more diarrhea since in the hospital, denies ab pain, denies urinary symptom, transitioned to subQ insulin Possible discharge in am, if continue to improve.   Reports take 20units long acting insulin qhs at home, ordered

## 2021-12-06 NOTE — Assessment & Plan Note (Signed)
Stable.  Continue IV fluids.

## 2021-12-06 NOTE — Assessment & Plan Note (Signed)
Stable.  Patient states he has been taking his HIV disease meds.

## 2021-12-06 NOTE — ED Notes (Signed)
Pt arrived to 5W13 via stretcher able to transfer to bed safely. RA no distress VS as follows:    12/06/21 1530  Vitals  Temp 97.7 F (36.5 C)  Temp Source Axillary  BP (!) 194/117  MAP (mmHg) 137  BP Location Left Arm  BP Method Automatic  Patient Position (if appropriate) Sitting  Pulse Rate 71  Pulse Rate Source Monitor  ECG Heart Rate 72  Resp 14  Level of Consciousness  Level of Consciousness Alert  MEWS COLOR  MEWS Score Color Green  Oxygen Therapy  SpO2 99 %  O2 Device Room Air  Pain Assessment  Pain Scale 0-10  Pain Score 0  MEWS Score  MEWS Temp 0  MEWS Systolic 0  MEWS Pulse 0  MEWS RR 0  MEWS LOC 0  MEWS Score 0

## 2021-12-06 NOTE — Assessment & Plan Note (Signed)
Chronic. 

## 2021-12-06 NOTE — Assessment & Plan Note (Signed)
Due to his slight dehydration, hold his BP medications until tomorrow.

## 2021-12-06 NOTE — Inpatient Diabetes Management (Signed)
Inpatient Diabetes Program Recommendations  AACE/ADA: New Consensus Statement on Inpatient Glycemic Control (2015)  Target Ranges:  Prepandial:   less than 140 mg/dL      Peak postprandial:   less than 180 mg/dL (1-2 hours)      Critically ill patients:  140 - 180 mg/dL   Lab Results  Component Value Date   GLUCAP 214 (H) 12/06/2021   HGBA1C 6.0 (H) 08/17/2021    Review of Glycemic Control  Diabetes history: type 1 Outpatient Diabetes medications: Semglee 20 units daily, Novolog 0-15 units correction scale TID, Novolog 5 units TID Current orders for Inpatient glycemic control: IV insulin  Inpatient Diabetes Program Recommendations:   Patient is very familiar to our inpatient diabetes team.  Recommend transitioning  to Semglee 15 units daily, Novolog 0-9 units TID correction scale, Novolog 0-5 units HS scale, and Novolog 3 units TID with meals if eating at least 50% of meal. Titrate dosages as needed.  Will continue to monitor blood sugars while in the hospital.  [name] RN BSN CDE Diabetes Coordinator Pager: [phone]  8am-5pm

## 2021-12-06 NOTE — ED Notes (Signed)
   12/06/21 1526  Hand-Off documentation  Handoff Received Received from ED RN, ready to receive patient  Report received from (Full Name) Lorenza Chick

## 2021-12-06 NOTE — ED Notes (Signed)
   12/06/21 1636  Provider Notification  Provider Name/Title Florencia Reasons MD  Date Provider Notified 12/06/21  Time Provider Notified 1636  Method of Notification Page  Notification Reason Other (Comment) (pls review BP and add prn with parameter as needed thank you)  Provider response Other (Comment) (waiting for response; admitted from ED)

## 2021-12-06 NOTE — Assessment & Plan Note (Signed)
Observation progressive care bed.  Start insulin drip protocol.  Continue LR.

## 2021-12-06 NOTE — ED Notes (Signed)
Paged admitting to RN per her request 

## 2021-12-06 NOTE — Subjective & Objective (Addendum)
CC: elevated blood sugar HPI: 56 year old African-American male history of bilateral lower extremity amputation, history of HIV disease, uncontrolled type 2 diabetes, CKD stage IV, presents to the ER today with elevated blood sugars.  Patient states he has had about 3 days of elevated blood sugars.  He is only taken 20 units of NovoLog at home.  Patient is also had multiple episodes of diarrhea.  He states he had 7 days of diarrhea.  Nonbloody.  No nausea, vomiting.  Patient supposed to be taking insulin at home.   Labs showed a serum glucose of 725, pseudohyponatremia with a sodium of 126, BUN of 43, creatinine 3.8.  Serum bicarb of 19  Venous blood gas pH is 7.28, PCO2 of 46  Due to the patient's hyperglycemia without evidence of ketoacidosis, patient started on IV insulin.  [facility] hospitalist contacted for admission.

## 2021-12-06 NOTE — ED Notes (Signed)
ED TO INPATIENT HANDOFF REPORT  ED Nurse Name and Phone #: Caryl Pina [phone]  S Name/Age/Gender Keith [name] 56 y.o. male Room/Bed: 034C/034C  Code Status   Code Status: Full Code  Home/SNF/Other Home Patient oriented to: self, place, time, and situation Is this baseline? Yes   Triage Complete: Triage complete  Chief Complaint Type 2 diabetes mellitus with hyperosmolar nonketotic hyperglycemia ([place]) [E11.00] Hyperglycemia [R73.9]  Triage Note Pt via [facility] from car, pt states he knew his CBG was up "all day;" EMS CBG read "HIGH." Pt reports multiple episodes of diarrhea today. 20g R hand, 449mL fluids PTA  152/97 HR 90 98%   Allergies Allergies  Allergen Reactions   Elemental Sulfur Itching    Patient stated he's allergic to "sulfur" AND "sulfa"   Sulfa Antibiotics Itching    Level of Care/Admitting Diagnosis ED Disposition     ED Disposition  Admit   Condition  --   [place]: [place] [[id number]]  Level of Care: Progressive [102]  Admit to Progressive based on following criteria: MULTISYSTEM THREATS such as stable sepsis, metabolic/electrolyte imbalance with or without encephalopathy that is responding to early treatment.  May admit patient to [facility] or [facility] if equivalent level of care is available:: No  Covid Evaluation: Confirmed COVID Negative  Diagnosis: Hyperglycemia [[id number]]  Admitting Physician: Florencia Reasons [[id number]]  Attending Physician: Florencia Reasons [[id number]]  Certification:: I certify this patient will need inpatient services for at least 2 midnights          B Medical/Surgery History Past Medical History:  Diagnosis Date   Abscess of right foot    abscess/ulcer of R transtibial amputation requiring IV abx   Acute ST elevation myocardial infarction (STEMI) due to occlusion of circumflex coronary artery ([place]) 03/22/2020   AIDS ([place])    Anemia    CAD (coronary artery disease)    a. MI with stenting of OM1  in 11/2018 with residual disease. b. acute STEMI 03/2020 s/p DES to OM1   Chronic anemia    Chronic knee pain    right   Chronic pain    CKD (chronic kidney disease), stage IV (HCC)    CVA (cerebral vascular accident) ([place]) 04/2020   Diabetes type 2, uncontrolled    HgA1c 17.6 (04/27/2010)   Diabetic foot ulcer ([place]) 01/2017   right foot   Dilatation of aorta (HCC)    Erectile dysfunction    Genital warts    GERD (gastroesophageal reflux disease)    History of blood transfusion    HIV (human immunodeficiency virus infection) ([place]) 2009   CD4 count 100, VL 13800 (05/01/2010)   Hyperlipidemia    Hypertension    Myocardial infarction [facility])    Neuropathy    Noncompliance with medication regimen    Osteomyelitis ([place])    h/o hand   Osteomyelitis of metatarsal ([place]) 04/28/2017   Pneumonia    STEMI (ST elevation myocardial infarction) ([place]) 11/10/2018   Past Surgical History:  Procedure Laterality Date   AMPUTATION Right 05/02/2017   Procedure: AMPUTATION TRANSMETARSAL;  Surgeon: [name], MD;  Location: [place];  Service: Orthopedics;  Laterality: Right;   AMPUTATION Right 06/13/2017   Procedure: RIGHT BELOW KNEE AMPUTATION;  Surgeon: [name], MD;  Location: [place];  Service: Orthopedics;  Laterality: Right;   AMPUTATION Left 10/27/2020   Procedure: LEFT BELOW KNEE AMPUTATION;  Surgeon: [name], MD;  Location: [place];  Service: Orthopedics;  Laterality:  Left;   AMPUTATION Left 11/17/2020   Procedure: REVISION AMPUTATION BELOW KNEE, LEFT;  Surgeon: [name], MD;  Location: [place];  Service: Orthopedics;  Laterality: Left;   AMPUTATION Left 01/03/2021   Procedure: LEFT ABOVE KNEE AMPUTATION;  Surgeon: [name], MD;  Location: [place];  Service: Orthopedics;  Laterality: Left;   APPLICATION OF WOUND VAC Left 10/27/2020   Procedure: APPLICATION OF WOUND VAC;  Surgeon: [name], MD;  Location: [place];  Service: Orthopedics;  Laterality: Left;   BELOW KNEE LEG  AMPUTATION Right 06/13/2017   BELOW KNEE LEG AMPUTATION     CORONARY STENT INTERVENTION N/A 11/10/2018   Procedure: CORONARY STENT INTERVENTION;  Surgeon: Leonie Man, MD;  Location: [place] CV LAB;  Service: Cardiovascular;  Laterality: N/A;   CORONARY/GRAFT ACUTE MI REVASCULARIZATION N/A 03/21/2020   Procedure: Coronary/Graft Acute MI Revascularization;  Surgeon: [name], MD;  Location: [place] CV LAB;  Service: Cardiovascular;  Laterality: N/A;   CYSTOSCOPY N/A 11/09/2020   Procedure: CYSTOSCOPY, EVACUATION OF CLOTS, FULGERATION OF BLADDER AND BILATERIAL RETROGRADE PYELOGRAMS;  Surgeon: Janith Lima, MD;  Location: [place];  Service: Urology;  Laterality: N/A;   HERNIA REPAIR     I & D EXTREMITY Left 08/21/2014   Procedure: INCISION AND DRAINAGE LEFT SMALL FINGER;  Surgeon: Leanora Cover, MD;  Location: [place];  Service: Orthopedics;  Laterality: Left;   I & D EXTREMITY Right 03/18/2017   Procedure: IRRIGATION AND DEBRIDEMENT EXTREMITY;  Surgeon: [name], MD;  Location: [place];  Service: Orthopedics;  Laterality: Right;   IR FLUORO GUIDE CV LINE RIGHT  11/02/2020   IR REMOVAL TUN CV CATH W/O FL  11/13/2020   IR US GUIDE VASC ACCESS RIGHT  11/02/2020   LEFT HEART CATH AND CORONARY ANGIOGRAPHY N/A 11/10/2018   Procedure: LEFT HEART CATH AND CORONARY ANGIOGRAPHY;  Surgeon: Leonie Man, MD;  Location: [place] CV LAB;  Service: Cardiovascular;  Laterality: N/A;   LEFT HEART CATH AND CORONARY ANGIOGRAPHY N/A 03/21/2020   Procedure: LEFT HEART CATH AND CORONARY ANGIOGRAPHY;  Surgeon: [name], MD;  Location: [place] CV LAB;  Service: Cardiovascular;  Laterality: N/A;   MINOR IRRIGATION AND DEBRIDEMENT OF WOUND Right 04/22/2014   Procedure: IRRIGATION AND DEBRIDEMENT OF RIGHT NECK ABCESS;  Surgeon: Jerrell Belfast, MD;  Location: [place];  Service: ENT;  Laterality: Right;   MULTIPLE EXTRACTIONS WITH ALVEOLOPLASTY N/A 01/18/2013   Procedure: MULTIPLE EXTRACION 3,  6, 7, 10, 11, 13, 21, 22, 27, 28, 29, 30 WITH ALVEOLOPLASTY;  Surgeon: Gae Bon, DDS;  Location: [place];  Service: Oral Surgery;  Laterality: N/A;   SKIN SPLIT GRAFT Right 03/21/2017   Procedure: IRRIGATION AND DEBRIDEMENT RIGHT FOOT AND APPLY SPLIT THICKNESS SKIN GRAFT AND WOUND VAC;  Surgeon: [name], MD;  Location: [place];  Service: Orthopedics;  Laterality: Right;   STUMP REVISION Left 12/02/2020   Procedure: REVISION LEFT BELOW KNEE AMPUTATION;  Surgeon: [name], MD;  Location: [place];  Service: Orthopedics;  Laterality: Left;   TEE WITHOUT CARDIOVERSION N/A 05/02/2017   Procedure: TRANSESOPHAGEAL ECHOCARDIOGRAM (TEE);  Surgeon: Acie Fredrickson Wonda Cheng, MD;  Location: [facility] OR;  Service: Cardiovascular;  Laterality: N/A;  coincidental to orthopedic case     A IV Location/Drains/Wounds Patient Lines/Drains/Airways Status     Active Line/Drains/Airways     Name Placement date Placement time Site Days   Peripheral IV 12/06/21 20 G Posterior;Right Hand 12/06/21  --  Hand  less than 1   Peripheral IV 12/06/21 20 G Right Wrist 12/06/21  0259  Wrist  less than 1   Peripheral IV 12/06/21 20 G Left Antecubital 12/06/21  1205  Antecubital  less than 1   Wound / Incision (Open or Dehisced) 10/09/21 Other (Comment) Knee Anterior;Left shallow crater on stump 10/09/21  1347  Knee  58   Wound / Incision (Open or Dehisced) 10/09/21 Other (Comment) Knee Anterior;Left shallow open area on underside of stump right 10/09/21  1348  Knee  58   Wound / Incision (Open or Dehisced) 10/09/21 Irritant Dermatitis (Moisture Associated Skin Damage) Scrotum Left open area left side of scrotum 10/09/21  1349  Scrotum  58            Intake/Output Last 24 hours No intake or output data in the 24 hours ending 12/06/21 1516  Labs/Imaging Results for orders placed or performed during the hospital encounter of 12/05/21 (from the past 48 hour(s))  CBC with Differential     Status: Abnormal   Collection Time:  12/05/21 11:07 PM  Result Value Ref Range   WBC 11.4 (H) 4.0 - 10.5 K/uL   RBC 3.94 (L) 4.22 - 5.81 MIL/uL   Hemoglobin 11.5 (L) 13.0 - 17.0 g/dL   HCT 35.0 (L) 39.0 - 52.0 %   MCV 88.8 80.0 - 100.0 fL   MCH 29.2 26.0 - 34.0 pg   MCHC 32.9 30.0 - 36.0 g/dL   RDW 13.5 11.5 - 15.5 %   Platelets 257 150 - 400 K/uL   nRBC 0.0 0.0 - 0.2 %   Neutrophils Relative % 79 %   Neutro Abs 9.0 (H) 1.7 - 7.7 K/uL   Lymphocytes Relative 13 %   Lymphs Abs 1.4 0.7 - 4.0 K/uL   Monocytes Relative 6 %   Monocytes Absolute 0.7 0.1 - 1.0 K/uL   Eosinophils Relative 1 %   Eosinophils Absolute 0.2 0.0 - 0.5 K/uL   Basophils Relative 0 %   Basophils Absolute 0.0 0.0 - 0.1 K/uL   Immature Granulocytes 1 %   Abs Immature Granulocytes 0.06 0.00 - 0.07 K/uL    Comment: Performed at [facility], [address], [place], [place] [address]  Comprehensive metabolic panel     Status: Abnormal   Collection Time: 12/05/21 11:07 PM  Result Value Ref Range   Sodium 126 (L) 135 - 145 mmol/L   Potassium 4.7 3.5 - 5.1 mmol/L    Comment: HEMOLYSIS AT THIS LEVEL MAY AFFECT RESULT   Chloride 98 98 - 111 mmol/L   CO2 19 (L) 22 - 32 mmol/L   Glucose, Bld 725 (HH) 70 - 99 mg/dL    Comment: CRITICAL RESULT CALLED TO, READ BACK BY AND VERIFIED WITH M. RUGGIERO RN, 0125, 12/06/21, EADEDOKUN Glucose reference range applies only to samples taken after fasting for at least 8 hours.    BUN 43 (H) 6 - 20 mg/dL   Creatinine, Ser 3.80 (H) 0.61 - 1.24 mg/dL   Calcium 8.1 (L) 8.9 - 10.3 mg/dL   Total Protein 6.7 6.5 - 8.1 g/dL   Albumin 2.7 (L) 3.5 - 5.0 g/dL   AST 20 15 - 41 U/L    Comment: HEMOLYSIS AT THIS LEVEL MAY AFFECT RESULT   ALT 15 0 - 44 U/L    Comment: HEMOLYSIS AT THIS LEVEL MAY AFFECT RESULT   Alkaline Phosphatase 115 38 - 126 U/L   Total Bilirubin 1.1 0.3 - 1.2  mg/dL    Comment: HEMOLYSIS AT THIS LEVEL MAY AFFECT RESULT   GFR, Estimated 18 (L) >60 mL/min    Comment: (NOTE) Calculated using the CKD-EPI  Creatinine Equation (2021)    Anion gap 9 5 - 15    Comment: Performed at [facility], [place] [address] [place], [place] [address]  CBG monitoring, ED     Status: Abnormal   Collection Time: 12/06/21 12:16 AM  Result Value Ref Range   Glucose-Capillary >600 (HH) 70 - 99 mg/dL    Comment: Glucose reference range applies only to samples taken after fasting for at least 8 hours.  Beta-hydroxybutyric acid     Status: None   Collection Time: 12/06/21  2:05 AM  Result Value Ref Range   Beta-Hydroxybutyric Acid 0.23 0.05 - 0.27 mmol/L    Comment: Performed at [facility], [place] [address] [place], [place] [address]  Urinalysis, Routine w reflex microscopic Urine, Clean Catch     Status: Abnormal   Collection Time: 12/06/21  2:08 AM  Result Value Ref Range   Color, Urine YELLOW YELLOW   APPearance CLOUDY (A) CLEAR   Specific Gravity, Urine 1.014 1.005 - 1.030   pH 5.0 5.0 - 8.0   Glucose, UA >=500 (A) NEGATIVE mg/dL   Hgb urine dipstick MODERATE (A) NEGATIVE   Bilirubin Urine NEGATIVE NEGATIVE   Ketones, ur NEGATIVE NEGATIVE mg/dL   Protein, ur >=300 (A) NEGATIVE mg/dL   Nitrite NEGATIVE NEGATIVE   Leukocytes,Ua LARGE (A) NEGATIVE   RBC / HPF 6-10 0 - 5 RBC/hpf   WBC, UA >50 (H) 0 - 5 WBC/hpf   Bacteria, UA MANY (A) NONE SEEN   Squamous Epithelial / LPF 0-5 0 - 5   WBC Clumps PRESENT    Granular Casts, UA PRESENT     Comment: Performed at [facility], [address], [place],  [address]  I-Stat venous blood gas, [facility] ED only)     Status: Abnormal   Collection Time: 12/06/21  2:22 AM  Result Value Ref Range   pH, Ven 7.287 7.25 - 7.43   pCO2, Ven 44.6 44 - 60 mmHg   pO2, Ven 87 (H) 32 - 45 mmHg   Bicarbonate 21.3 20.0 - 28.0 mmol/L   TCO2 23 22 - 32 mmol/L   O2 Saturation 95 %   Acid-base deficit 5.0 (H) 0.0 - 2.0 mmol/L   Sodium 128 (L) 135 - 145 mmol/L   Potassium 4.0 3.5 - 5.1 mmol/L   Calcium, Ion 1.14 (L) 1.15 - 1.40 mmol/L   HCT 38.0 (L) 39.0 -  52.0 %   Hemoglobin 12.9 (L) 13.0 - 17.0 g/dL   Sample type VENOUS   CBG monitoring, ED     Status: Abnormal   Collection Time: 12/06/21  2:48 AM  Result Value Ref Range   Glucose-Capillary >600 (HH) 70 - 99 mg/dL    Comment: Glucose reference range applies only to samples taken after fasting for at least 8 hours.  CBG monitoring, ED     Status: Abnormal   Collection Time: 12/06/21  3:38 AM  Result Value Ref Range   Glucose-Capillary 559 (HH) 70 - 99 mg/dL    Comment: Glucose reference range applies only to samples taken after fasting for at least 8 hours.  CBG monitoring, ED     Status: Abnormal   Collection Time: 12/06/21  4:44 AM  Result Value Ref Range   Glucose-Capillary 508 (HH) 70 - 99  mg/dL    Comment: Glucose reference range applies only to samples taken after fasting for at least 8 hours.  Basic metabolic panel     Status: Abnormal   Collection Time: 12/06/21  4:51 AM  Result Value Ref Range   Sodium 130 (L) 135 - 145 mmol/L   Potassium 3.6 3.5 - 5.1 mmol/L    Comment: DELTA CHECK NOTED   Chloride 102 98 - 111 mmol/L   CO2 19 (L) 22 - 32 mmol/L   Glucose, Bld 514 (HH) 70 - 99 mg/dL    Comment: CRITICAL RESULT CALLED TO, READ BACK BY AND VERIFIED WITH K. MOON, RN, [phone], 12/06/21, EADEDOKUN Glucose reference range applies only to samples taken after fasting for at least 8 hours.    BUN 40 (H) 6 - 20 mg/dL   Creatinine, Ser 3.89 (H) 0.61 - 1.24 mg/dL   Calcium 8.5 (L) 8.9 - 10.3 mg/dL   GFR, Estimated 17 (L) >60 mL/min    Comment: (NOTE) Calculated using the CKD-EPI Creatinine Equation (2021)    Anion gap 9 5 - 15    Comment: Performed at [facility] [address], [place], [place] [address]  CBG monitoring, ED     Status: Abnormal   Collection Time: 12/06/21  5:26 AM  Result Value Ref Range   Glucose-Capillary 457 (H) 70 - 99 mg/dL    Comment: Glucose reference range applies only to samples taken after fasting for at least 8 hours.  CBG monitoring, ED      Status: Abnormal   Collection Time: 12/06/21  6:09 AM  Result Value Ref Range   Glucose-Capillary 424 (H) 70 - 99 mg/dL    Comment: Glucose reference range applies only to samples taken after fasting for at least 8 hours.   Comment 1 Notify RN   CBG monitoring, ED     Status: Abnormal   Collection Time: 12/06/21  7:00 AM  Result Value Ref Range   Glucose-Capillary 336 (H) 70 - 99 mg/dL    Comment: Glucose reference range applies only to samples taken after fasting for at least 8 hours.  Basic metabolic panel     Status: Abnormal   Collection Time: 12/06/21  7:53 AM  Result Value Ref Range   Sodium 136 135 - 145 mmol/L   Potassium 3.3 (L) 3.5 - 5.1 mmol/L   Chloride 103 98 - 111 mmol/L   CO2 21 (L) 22 - 32 mmol/L   Glucose, Bld 252 (H) 70 - 99 mg/dL    Comment: Glucose reference range applies only to samples taken after fasting for at least 8 hours.   BUN 40 (H) 6 - 20 mg/dL   Creatinine, Ser 3.84 (H) 0.61 - 1.24 mg/dL   Calcium 9.1 8.9 - 10.3 mg/dL   GFR, Estimated 17 (L) >60 mL/min    Comment: (NOTE) Calculated using the CKD-EPI Creatinine Equation (2021)    Anion gap 12 5 - 15    Comment: Performed at [facility] [address] [place], [place] [address]  CBG monitoring, ED     Status: Abnormal   Collection Time: 12/06/21  8:29 AM  Result Value Ref Range   Glucose-Capillary 214 (H) 70 - 99 mg/dL    Comment: Glucose reference range applies only to samples taken after fasting for at least 8 hours.  CBG monitoring, ED     Status: Abnormal   Collection Time: 12/06/21  9:36 AM  Result Value Ref Range  Glucose-Capillary 204 (H) 70 - 99 mg/dL    Comment: Glucose reference range applies only to samples taken after fasting for at least 8 hours.  CBG monitoring, ED     Status: Abnormal   Collection Time: 12/06/21 10:47 AM  Result Value Ref Range   Glucose-Capillary 163 (H) 70 - 99 mg/dL    Comment: Glucose reference range applies only to samples taken after fasting for at  least 8 hours.  CBG monitoring, ED     Status: Abnormal   Collection Time: 12/06/21 11:46 AM  Result Value Ref Range   Glucose-Capillary 125 (H) 70 - 99 mg/dL    Comment: Glucose reference range applies only to samples taken after fasting for at least 8 hours.  Basic metabolic panel     Status: Abnormal   Collection Time: 12/06/21 12:03 PM  Result Value Ref Range   Sodium 137 135 - 145 mmol/L   Potassium 3.6 3.5 - 5.1 mmol/L   Chloride 106 98 - 111 mmol/L   CO2 23 22 - 32 mmol/L   Glucose, Bld 123 (H) 70 - 99 mg/dL    Comment: Glucose reference range applies only to samples taken after fasting for at least 8 hours.   BUN 38 (H) 6 - 20 mg/dL   Creatinine, Ser 3.68 (H) 0.61 - 1.24 mg/dL   Calcium 9.1 8.9 - 10.3 mg/dL   GFR, Estimated 18 (L) >60 mL/min    Comment: (NOTE) Calculated using the CKD-EPI Creatinine Equation (2021)    Anion gap 8 5 - 15    Comment: Performed at [facility] [address], [place], [place] [address]  CBG monitoring, ED     Status: Abnormal   Collection Time: 12/06/21 12:56 PM  Result Value Ref Range   Glucose-Capillary 149 (H) 70 - 99 mg/dL    Comment: Glucose reference range applies only to samples taken after fasting for at least 8 hours.   *Note: Due to a large number of results and/or encounters for the requested time period, some results have not been displayed. A complete set of results can be found in Results Review.   No results found.  Pending Labs Unresulted Labs (From admission, onward)     Start     Ordered   12/06/21 0830  Urine Culture  (Urine Culture)  Once,   R       Question:  Indication  Answer:  Dysuria   12/06/21 0829   12/06/21 0827  C Difficile Quick Screen w PCR reflex  (C Difficile quick screen w PCR reflex panel )  Once, for 24 hours,   TIMED       References:    CDiff Information Tool   12/06/21 0826   12/06/21 7425  Basic metabolic panel  (Hyperglycemic Hyperosmolar State (HHS))  STAT Now then every 4 hours ,   R       12/06/21 0352   12/06/21 0329  Gastrointestinal Panel by PCR , Stool  (Gastrointestinal Panel by PCR, Stool                                                                                                                                                     **  Does Not include CLOSTRIDIUM DIFFICILE testing. **If CDIFF testing is needed, place order from the "C Difficile Testing" order set.**)  Once,   URGENT        12/06/21 0328            Vitals/Pain Today's Vitals   12/06/21 1345 12/06/21 1400 12/06/21 1430 12/06/21 1500  BP:  (!) 177/107 (!) 157/97 (!) 167/101  Pulse: 86 84 77 76  Resp: 17 15    Temp:      TempSrc:      SpO2: 99% 98% 95% 94%  PainSc:        Isolation Precautions Enteric precautions (UV disinfection)  Medications Medications  dextrose 50 % solution 0-50 mL (has no administration in time range)  dapsone tablet 100 mg (100 mg Oral Not Given 12/06/21 1355)  dolutegravir-lamiVUDine (DOVATO) 50-300 MG per tablet 1 tablet (1 tablet Oral Not Given 12/06/21 1355)  doravirine (PIFELTRO) tablet 100 mg (100 mg Oral Not Given 12/06/21 1356)  atorvastatin (LIPITOR) tablet 80 mg (80 mg Oral Given 12/06/21 1315)  ezetimibe (ZETIA) tablet 10 mg (10 mg Oral Given 12/06/21 1314)  carvedilol (COREG) tablet 6.25 mg (has no administration in time range)  pantoprazole (PROTONIX) EC tablet 40 mg (40 mg Oral Given 12/06/21 1314)  sodium bicarbonate tablet 650 mg (650 mg Oral Given 12/06/21 1315)  clopidogrel (PLAVIX) tablet 75 mg (75 mg Oral Given 12/06/21 1316)  heparin injection 5,000 Units (5,000 Units Subcutaneous Given 12/06/21 1507)  acetaminophen (TYLENOL) tablet 650 mg (has no administration in time range)    Or  acetaminophen (TYLENOL) suppository 650 mg (has no administration in time range)  ondansetron (ZOFRAN) tablet 4 mg (has no administration in time range)    Or  ondansetron (ZOFRAN) injection 4 mg (has no administration in time range)  insulin glargine-yfgn (SEMGLEE)  injection 20 Units (20 Units Subcutaneous Given 12/06/21 1154)  insulin aspart (novoLOG) injection 0-9 Units (1 Units Subcutaneous Given 12/06/21 1313)  0.9 %  sodium chloride infusion ( Intravenous New Bag/Given 12/06/21 1514)  sodium chloride 0.9 % bolus 500 mL (0 mLs Intravenous Stopped 12/06/21 0239)  sodium chloride 0.9 % bolus 500 mL (0 mLs Intravenous Stopped 12/06/21 0600)  metoprolol tartrate (LOPRESSOR) injection 2.5 mg (2.5 mg Intravenous Given 12/06/21 1509)    Mobility Bilateral prosthesis at bedside High fall risk   Focused Assessments    R Recommendations: See Admitting Provider Note  Report given to:   Additional Notes:

## 2021-12-06 NOTE — ED Notes (Deleted)
   12/06/21 1636  Provider Notification  Provider Name/Title Florencia Reasons MD  Date Provider Notified 12/06/21  Time Provider Notified 1636  Method of Notification Page  Notification Reason Other (Comment) (pls review BP and add prn with parameter as needed thank you)  Provider response Other (Comment) (waiting for response; admitted from ED)

## 2021-12-06 NOTE — H&P (Signed)
History and Physical    Keith [name]:[id number] DOB: 1965/05/23 DOA: 12/05/2021  DOS: the patient was seen and examined on 12/05/2021  [name], NP   Patient coming from: Home  I have personally briefly reviewed patient's old medical records in [place]  CC: elevated blood sugar HPI: 56 year old African-American male history of bilateral lower extremity amputation, history of HIV disease, uncontrolled type 2 diabetes, CKD stage IV, presents to the ER today with elevated blood sugars.  Patient states he has had about 3 days of elevated blood sugars.  He is only taken 20 units of NovoLog at home.  Patient is also had multiple episodes of diarrhea.  He states he had 7 days of diarrhea.  Nonbloody.  No nausea, vomiting.  Patient supposed to be taking insulin at home.   Labs showed a serum glucose of 725, pseudohyponatremia with a sodium of 126, BUN of 43, creatinine 3.8.  Serum bicarb of 19  Venous blood gas pH is 7.28, PCO2 of 46  Due to the patient's hyperglycemia without evidence of ketoacidosis, patient started on IV insulin.  [facility] hospitalist contacted for admission.      ED Course: serum glucose >700. Started on insulin gtts.  Review of Systems:  Review of Systems  Constitutional: Negative.   HENT: Negative.    Eyes: Negative.   Respiratory: Negative.    Cardiovascular: Negative.   Gastrointestinal:  Positive for diarrhea. Negative for nausea and vomiting.  Genitourinary: Negative.   Musculoskeletal: Negative.   Skin: Negative.   Neurological: Negative.   Endo/Heme/Allergies:  Positive for polydipsia.       Elevated BS at home.  Psychiatric/Behavioral: Negative.    All other systems reviewed and are negative.   Past Medical History:  Diagnosis Date   Abscess of right foot    abscess/ulcer of R transtibial amputation requiring IV abx   Acute ST elevation myocardial infarction (STEMI) due to occlusion of circumflex coronary artery ([place])  03/22/2020   AIDS ([place])    Anemia    CAD (coronary artery disease)    a. MI with stenting of OM1 in 11/2018 with residual disease. b. acute STEMI 03/2020 s/p DES to OM1   Chronic anemia    Chronic knee pain    right   Chronic pain    CKD (chronic kidney disease), stage IV (HCC)    CVA (cerebral vascular accident) ([place]) 04/2020   Diabetes type 2, uncontrolled    HgA1c 17.6 (04/27/2010)   Diabetic foot ulcer ([place]) 01/2017   right foot   Dilatation of aorta (HCC)    Erectile dysfunction    Genital warts    GERD (gastroesophageal reflux disease)    History of blood transfusion    HIV (human immunodeficiency virus infection) ([place]) 2009   CD4 count 100, VL 13800 (05/01/2010)   Hyperlipidemia    Hypertension    Myocardial infarction [facility])    Neuropathy    Noncompliance with medication regimen    Osteomyelitis ([place])    h/o hand   Osteomyelitis of metatarsal ([place]) 04/28/2017   Pneumonia    STEMI (ST elevation myocardial infarction) ([place]) 11/10/2018    Past Surgical History:  Procedure Laterality Date   AMPUTATION Right 05/02/2017   Procedure: AMPUTATION TRANSMETARSAL;  Surgeon: [name], MD;  Location: [place];  Service: Orthopedics;  Laterality: Right;   AMPUTATION Right 06/13/2017   Procedure: RIGHT BELOW KNEE AMPUTATION;  Surgeon: [name], MD;  Location: [place];  Service:  Orthopedics;  Laterality: Right;   AMPUTATION Left 10/27/2020   Procedure: LEFT BELOW KNEE AMPUTATION;  Surgeon: [name], MD;  Location: [place];  Service: Orthopedics;  Laterality: Left;   AMPUTATION Left 11/17/2020   Procedure: REVISION AMPUTATION BELOW KNEE, LEFT;  Surgeon: [name], MD;  Location: [place];  Service: Orthopedics;  Laterality: Left;   AMPUTATION Left 01/03/2021   Procedure: LEFT ABOVE KNEE AMPUTATION;  Surgeon: [name], MD;  Location: [place];  Service: Orthopedics;  Laterality: Left;   APPLICATION OF WOUND VAC Left 10/27/2020   Procedure: APPLICATION OF WOUND VAC;  Surgeon:  [name], MD;  Location: [place];  Service: Orthopedics;  Laterality: Left;   BELOW KNEE LEG AMPUTATION Right 06/13/2017   BELOW KNEE LEG AMPUTATION     CORONARY STENT INTERVENTION N/A 11/10/2018   Procedure: CORONARY STENT INTERVENTION;  Surgeon: Leonie Man, MD;  Location: [place] CV LAB;  Service: Cardiovascular;  Laterality: N/A;   CORONARY/GRAFT ACUTE MI REVASCULARIZATION N/A 03/21/2020   Procedure: Coronary/Graft Acute MI Revascularization;  Surgeon: [name], MD;  Location: [place] CV LAB;  Service: Cardiovascular;  Laterality: N/A;   CYSTOSCOPY N/A 11/09/2020   Procedure: CYSTOSCOPY, EVACUATION OF CLOTS, FULGERATION OF BLADDER AND BILATERIAL RETROGRADE PYELOGRAMS;  Surgeon: Janith Lima, MD;  Location: [place];  Service: Urology;  Laterality: N/A;   HERNIA REPAIR     I & D EXTREMITY Left 08/21/2014   Procedure: INCISION AND DRAINAGE LEFT SMALL FINGER;  Surgeon: Leanora Cover, MD;  Location: [place];  Service: Orthopedics;  Laterality: Left;   I & D EXTREMITY Right 03/18/2017   Procedure: IRRIGATION AND DEBRIDEMENT EXTREMITY;  Surgeon: [name], MD;  Location: [place];  Service: Orthopedics;  Laterality: Right;   IR FLUORO GUIDE CV LINE RIGHT  11/02/2020   IR REMOVAL TUN CV CATH W/O FL  11/13/2020   IR US GUIDE VASC ACCESS RIGHT  11/02/2020   LEFT HEART CATH AND CORONARY ANGIOGRAPHY N/A 11/10/2018   Procedure: LEFT HEART CATH AND CORONARY ANGIOGRAPHY;  Surgeon: Leonie Man, MD;  Location: [place] CV LAB;  Service: Cardiovascular;  Laterality: N/A;   LEFT HEART CATH AND CORONARY ANGIOGRAPHY N/A 03/21/2020   Procedure: LEFT HEART CATH AND CORONARY ANGIOGRAPHY;  Surgeon: [name], MD;  Location: [place] CV LAB;  Service: Cardiovascular;  Laterality: N/A;   MINOR IRRIGATION AND DEBRIDEMENT OF WOUND Right 04/22/2014   Procedure: IRRIGATION AND DEBRIDEMENT OF RIGHT NECK ABCESS;  Surgeon: Jerrell Belfast, MD;  Location: [facility];  Service: ENT;  Laterality:  Right;   MULTIPLE EXTRACTIONS WITH ALVEOLOPLASTY N/A 01/18/2013   Procedure: MULTIPLE EXTRACION 3, 6, 7, 10, 11, 13, 21, 22, 27, 28, 29, 30 WITH ALVEOLOPLASTY;  Surgeon: Gae Bon, DDS;  Location: [place];  Service: Oral Surgery;  Laterality: N/A;   SKIN SPLIT GRAFT Right 03/21/2017   Procedure: IRRIGATION AND DEBRIDEMENT RIGHT FOOT AND APPLY SPLIT THICKNESS SKIN GRAFT AND WOUND VAC;  Surgeon: [name], MD;  Location: [place];  Service: Orthopedics;  Laterality: Right;   STUMP REVISION Left 12/02/2020   Procedure: REVISION LEFT BELOW KNEE AMPUTATION;  Surgeon: [name], MD;  Location: [place];  Service: Orthopedics;  Laterality: Left;   TEE WITHOUT CARDIOVERSION N/A 05/02/2017   Procedure: TRANSESOPHAGEAL ECHOCARDIOGRAM (TEE);  Surgeon: Acie Fredrickson Wonda Cheng, MD;  Location[facility] OR;  Service: Cardiovascular;  Laterality: N/A;  coincidental to orthopedic case     reports that  he has never smoked. He has never used smokeless tobacco. He reports that he does not currently use alcohol. He reports that he does not use drugs.  Allergies  Allergen Reactions   Elemental Sulfur Itching    Patient stated he's allergic to "sulfur" AND "sulfa"   Sulfa Antibiotics Itching    Family History  Problem Relation Age of Onset   Hypertension Mother    Arthritis Father    Hypertension Father    Hypertension Brother    Cancer Maternal Grandmother 76       unknown type of cancer   Depression Paternal Grandmother     Prior to Admission medications   Medication Sig Start Date End Date Taking? Authorizing Provider  allopurinol (ZYLOPRIM) 100 MG tablet Take 1 tablet (100 mg total) by mouth daily. 12/26/20 11/15/21  [name], MD  atorvastatin (LIPITOR) 80 MG tablet Take 1 tablet (80 mg total) by mouth daily. 09/01/20 01/02/22  [name], MD  carvedilol (COREG) 6.25 MG tablet Take 1 tablet (6.25 mg total) by mouth 2 (two) times daily. 11/15/21   Jettie Booze, MD  clopidogrel (PLAVIX) 75 MG  tablet Take 1 tablet (75 mg total) by mouth daily. 11/15/21   Jettie Booze, MD  cyclobenzaprine (FLEXERIL) 10 MG tablet Take 10 mg by mouth at bedtime. 09/27/21   [provider]  dapsone 100 MG tablet TAKE 1 TABLET (100 MG TOTAL) BY MOUTH DAILY. 02/13/21 02/13/22  Golden Circle, FNP  dolutegravir-lamiVUDine (DOVATO) 50-300 MG tablet Take 1 tablet by mouth daily. 02/13/21   Golden Circle, FNP  doravirine (PIFELTRO) 100 MG TABS tablet Take 1 tablet (100 mg total) by mouth daily. 02/13/21   Golden Circle, FNP  escitalopram (LEXAPRO) 20 MG tablet Take 1 tablet (20 mg total) by mouth daily. 10/19/21   Hosie Poisson, MD  ezetimibe (ZETIA) 10 MG tablet Take 1 tablet (10 mg total) by mouth daily. 11/15/21   Jettie Booze, MD  ferrous sulfate 325 (65 FE) MG tablet Take 1 tablet (325 mg total) by mouth every Monday, Wednesday, and Friday at 6 PM. Do not take with other medicines. 10/19/21 11/18/21  Hosie Poisson, MD  finasteride (PROSCAR) 5 MG tablet Take 5 mg by mouth daily. 08/20/21   [provider]  gabapentin (NEURONTIN) 100 MG capsule Take 1 capsule (100 mg total) by mouth 3 (three) times daily. 10/19/21 12/18/21  Hosie Poisson, MD  icosapent Ethyl (VASCEPA) 1 g capsule TAKE 2 CAPSULES (2 G TOTAL) BY MOUTH TWO TIMES DAILY. 03/27/20 11/15/21  [name], MD  insulin aspart (NOVOLOG) 100 UNIT/ML injection Inject 5 Units into the skin 3 (three) times daily with meals. 10/19/21   Hosie Poisson, MD  insulin aspart (NOVOLOG) 100 UNIT/ML injection CBG 70 - 120: 0 units  CBG 121 - 150: 2 units  CBG 151 - 200: 3 units  CBG 201 - 250: 5 units  CBG 251 - 300: 8 units  CBG 301 - 350: 11 units  CBG 351 - 400: 15 units 10/19/21   Hosie Poisson, MD  insulin glargine-yfgn (SEMGLEE) 100 UNIT/ML injection Inject 0.2 mLs (20 Units total) into the skin daily. 10/20/21   Hosie Poisson, MD  Lancets [facility] ULTRASOFT) lancets Use as instructed 02/16/20   [name], FNP   loperamide (IMODIUM) 2 MG capsule Take 1 capsule (2 mg total) by mouth 4 (four) times daily as needed for diarrhea or loose stools. 11/21/21   Redwine, Madison A, PA-C  mupirocin cream (BACTROBAN) 2 % Apply topically daily. 10/20/21   Hosie Poisson, MD  nitroGLYCERIN (NITROSTAT) 0.4 MG SL tablet Place 0.4 mg under the tongue every 5 (five) minutes as needed for chest pain.    [provider]  Oxycodone HCl 10 MG TABS Take 10 mg by mouth every 4 (four) hours as needed. 10/31/21   [provider]  pantoprazole (PROTONIX) 40 MG tablet Take 1 tablet (40 mg total) by mouth daily. 10/19/21   Hosie Poisson, MD  senna (SENOKOT) 8.6 MG TABS tablet Take 2 tablets (17.2 mg total) by mouth at bedtime. 10/19/21   Hosie Poisson, MD  sodium bicarbonate 650 MG tablet Take 1 tablet (650 mg total) by mouth 2 (two) times daily. 10/19/21   Hosie Poisson, MD    Physical Exam: Vitals:   12/06/21 0015 12/06/21 0029 12/06/21 0100 12/06/21 0300  BP: (!) 195/124  (!) 166/99 (!) 162/95  Pulse: 92 89 86 87  Resp: (!) 24 (!) 21 17   Temp:      SpO2: 97% 96% 97% 96%    Physical Exam Vitals and nursing note reviewed.  Constitutional:      Appearance: He is obese.  HENT:     Head: Normocephalic and atraumatic.     Nose: Nose normal.  Cardiovascular:     Rate and Rhythm: Normal rate and regular rhythm.  Pulmonary:     Effort: Pulmonary effort is normal.     Breath sounds: Normal breath sounds. No rales.  Abdominal:     General: Bowel sounds are normal. There is no distension.     Palpations: Abdomen is soft.     Tenderness: There is no abdominal tenderness. There is no guarding or rebound.  Musculoskeletal:     Comments: Right BKA Left AKA  Skin:    Capillary Refill: Capillary refill takes less than 2 seconds.  Neurological:     General: No focal deficit present.     Mental Status: He is alert and oriented to person, place, and time.      Labs on Admission: I have personally reviewed  following labs and imaging studies  CBC: Recent Labs  Lab 12/05/21 2307 12/06/21 0222  WBC 11.4*  --   NEUTROABS 9.0*  --   HGB 11.5* 12.9*  HCT 35.0* 38.0*  MCV 88.8  --   PLT 257  --    Basic Metabolic Panel: Recent Labs  Lab 12/05/21 2307 12/06/21 0222  NA 126* 128*  K 4.7 4.0  CL 98  --   CO2 19*  --   GLUCOSE 725*  --   BUN 43*  --   CREATININE 3.80*  --   CALCIUM 8.1*  --    GFR: CrCl cannot be calculated (Unknown ideal weight.). Liver Function Tests: Recent Labs  Lab 12/05/21 2307  AST 20  ALT 15  ALKPHOS 115  BILITOT 1.1  PROT 6.7  ALBUMIN 2.7*   No results for input(s): "LIPASE", "AMYLASE" in the last 168 hours. No results for input(s): "AMMONIA" in the last 168 hours. Coagulation Profile: No results for input(s): "INR", "PROTIME" in the last 168 hours. Cardiac Enzymes: No results for input(s): "CKTOTAL", "CKMB", "CKMBINDEX", "TROPONINI", "TROPONINIHS" in the last 168 hours. BNP (last 3 results) No results for input(s): "PROBNP" in the last 8760 hours. HbA1C: No results for input(s): "HGBA1C" in the last 72 hours. CBG: Recent Labs  Lab 12/06/21 0016 12/06/21 0248  GLUCAP >600* >600*   Lipid Profile: No results for  input(s): "CHOL", "HDL", "LDLCALC", "TRIG", "CHOLHDL", "LDLDIRECT" in the last 72 hours. Thyroid Function Tests: No results for input(s): "TSH", "T4TOTAL", "FREET4", "T3FREE", "THYROIDAB" in the last 72 hours. Anemia Panel: No results for input(s): "VITAMINB12", "FOLATE", "FERRITIN", "TIBC", "IRON", "RETICCTPCT" in the last 72 hours. Urine analysis:    Component Value Date/Time   COLORURINE YELLOW 12/06/2021 0208   APPEARANCEUR CLOUDY (A) 12/06/2021 0208   LABSPEC 1.014 12/06/2021 0208   PHURINE 5.0 12/06/2021 0208   GLUCOSEU >=500 (A) 12/06/2021 0208   HGBUR MODERATE (A) 12/06/2021 0208   BILIRUBINUR NEGATIVE 12/06/2021 0208   BILIRUBINUR negative 09/07/2018 1034   KETONESUR NEGATIVE 12/06/2021 0208   PROTEINUR >=300 (A)  12/06/2021 0208   UROBILINOGEN 1.0 09/07/2018 1034   UROBILINOGEN 1.0 01/01/2017 1032   NITRITE NEGATIVE 12/06/2021 0208   LEUKOCYTESUR LARGE (A) 12/06/2021 0208    Radiological Exams on Admission: I have personally reviewed images No results found.  EKG: My personal interpretation of EKG shows: no EKG  Assessment/Plan Principal Problem:   Uncontrolled type 2 diabetes mellitus with hyperosmolar nonketotic hyperglycemia (HCC) Active Problems:   Diarrhea   HTN (hypertension)   HIV disease (HCC)   CKD (chronic kidney disease) stage 4, GFR 15-29 ml/min (HCC)   Hx of BKA, right (HCC)   History of left above knee amputation ([place])    Assessment and Plan: * Uncontrolled type 2 diabetes mellitus with hyperosmolar nonketotic hyperglycemia ([place]) Observation progressive care bed.  Start insulin drip protocol.  Continue LR.  Diarrhea Patient without nausea or vomiting.  Could be related to his HIV disease.  Check GI viral panel.  History of left above knee amputation (HCC) Chronic.  Hx of BKA, right (HCC) Chronic.  CKD (chronic kidney disease) stage 4, GFR 15-29 ml/min (HCC) Stable.  Continue IV fluids.  HIV disease ([place]) Stable.  Patient states he has been taking his HIV disease meds.  HTN (hypertension) Due to his slight dehydration, hold his BP medications until tomorrow.    DVT prophylaxis: SQ Heparin Code Status: Full Code Family Communication: no family at bedside  Disposition Plan: return home  Consults called: none  Admission status: Observation,  progressive   Kristopher Oppenheim, DO [facility] 12/06/2021, 3:43 AM

## 2021-12-06 NOTE — Progress Notes (Signed)
   12/06/21 1704 12/06/21 1800  Vitals  BP (!) (S)  192/117 (scheduled PO hydralazine given) (!) (S)  201/116 (IV hydralazine)  MAP (mmHg) 141 141  BP Location Left Arm  --   BP Method Automatic  --   Patient Position (if appropriate) Lying  --   Pulse Rate 81 90  ECG Heart Rate 80 89  Resp 15 17  MEWS COLOR  MEWS Score Color Green Yellow

## 2021-12-07 ENCOUNTER — Inpatient Hospital Stay (HOSPITAL_COMMUNITY): Payer: Medicare Other

## 2021-12-07 DIAGNOSIS — E1100 Type 2 diabetes mellitus with hyperosmolarity without nonketotic hyperglycemic-hyperosmolar coma (NKHHC): Secondary | ICD-10-CM | POA: Diagnosis not present

## 2021-12-07 LAB — BASIC METABOLIC PANEL
Anion gap: 11 (ref 5–15)
BUN: 37 mg/dL — ABNORMAL HIGH (ref 6–20)
CO2: 19 mmol/L — ABNORMAL LOW (ref 22–32)
Calcium: 8.7 mg/dL — ABNORMAL LOW (ref 8.9–10.3)
Chloride: 103 mmol/L (ref 98–111)
Creatinine, Ser: 3.86 mg/dL — ABNORMAL HIGH (ref 0.61–1.24)
GFR, Estimated: 17 mL/min — ABNORMAL LOW (ref >60)
Glucose, Bld: 426 mg/dL — ABNORMAL HIGH (ref 70–99)
Potassium: 3.7 mmol/L (ref 3.5–5.1)
Sodium: 133 mmol/L — ABNORMAL LOW (ref 135–145)

## 2021-12-07 LAB — GLUCOSE, CAPILLARY
Glucose-Capillary: 425 mg/dL — ABNORMAL HIGH (ref 70–99)
Glucose-Capillary: 387 mg/dL — ABNORMAL HIGH (ref 70–99)
Glucose-Capillary: 283 mg/dL — ABNORMAL HIGH (ref 70–99)
Glucose-Capillary: 285 mg/dL — ABNORMAL HIGH (ref 70–99)

## 2021-12-07 MED ORDER — SENNOSIDES-DOCUSATE SODIUM 8.6-50 MG PO TABS
1.0000 | ORAL_TABLET | Freq: Two times a day (BID) | ORAL | Status: DC
  Administered 2021-12-07: 1 via ORAL
  Filled 2021-12-07: qty 1

## 2021-12-07 MED ORDER — INSULIN GLARGINE-YFGN 100 UNIT/ML ~~LOC~~ SOLN
10.0000 [IU] | Freq: Once | SUBCUTANEOUS | Status: AC
  Administered 2021-12-07: 10 [IU] via SUBCUTANEOUS
  Filled 2021-12-07: qty 0.1

## 2021-12-07 MED ORDER — INSULIN GLARGINE-YFGN 100 UNIT/ML ~~LOC~~ SOLN
20.0000 [IU] | Freq: Every day | SUBCUTANEOUS | Status: DC
  Filled 2021-12-07: qty 0.2

## 2021-12-07 NOTE — Progress Notes (Signed)
PROGRESS NOTE    Keith [name]  ZOX:[id number] DOB: 01-30-1966 DOA: 12/05/2021 [name], NP     Brief Narrative:  History of HIV disease, with CD 4 count of 115/VL130, supposed to be on dovato/doravirine and dapsone for OI, h/o HTN, HLD, IDDM2, CAD/STEMI, CKDV,bilateral amputee, presents with elevated blood sugars, and diarrhea x7 days, denies Abdo pain, no nausea vomiting, does not appear in DKA   Subjective:  Vomited breakfast, a.m. blood glucose above 400 Denies ab pain, no diarrhea  No fever  Assessment & Plan:  Principal Problem:   Uncontrolled type 2 diabetes mellitus with hyperosmolar nonketotic hyperglycemia (HCC) Active Problems:   Diarrhea   HTN (hypertension)   HIV disease (HCC)   CKD (chronic kidney disease) stage 4, GFR 15-29 ml/min (HCC)   Hx of BKA, right (HCC)   History of left above knee amputation ([place])   Hyperglycemia   * Uncontrolled type 2 diabetes mellitus with hyperosmolar nonketotic hyperglycemia (HCC) Improving, off insulin drip, but blood glucose remain elevated, continue adjust insulin   N/V: Denies ab pain Kub showed constipation and possible ileus Start stool softener Increase activity   Diarrhea, likely overflow diarrhea , as kub showed constipation  No diarrhea since admitted Start stool softener   CKD (chronic kidney disease) stage 4, GFR 15-29 ml/min (HCC) Stable. Renal dosing meds  + urine culture, case discussed with ID, would not treat unless septic from this as appear chronic colonization    H/o CAD/STEMI No chest pain, continue home meds  HTN (hypertension) Stable on current regimen    Bilateral amputee: S/p left above knee amputation (HCC) S/p  BKA, right (HCC) Chronic. + prosthesis    HIV disease ([place]) with CD 4 count of 115/VL130, supposed to be on dovato/doravirine and dapsone for OI,       I have Reviewed nursing notes, Vitals, pain scores, I/o's, Lab results and  imaging results since pt's  last encounter, details please see discussion above  I ordered the following labs:  Unresulted Labs (From admission, onward)     Start     Ordered   12/08/21 0500  CBC with Differential/Platelet  Tomorrow morning,   R       Question:  Specimen collection method  Answer:  Lab=Lab collect   12/07/21 1514   12/08/21 8119  Basic metabolic panel  Tomorrow morning,   R       Question:  Specimen collection method  Answer:  Lab=Lab collect   12/07/21 1514   12/08/21 0500  Magnesium  Tomorrow morning,   R       Question:  Specimen collection method  Answer:  Lab=Lab collect   12/07/21 1514   12/08/21 0500  Hemoglobin A1c  Tomorrow morning,   R       Question:  Specimen collection method  Answer:  Lab=Lab collect   12/07/21 1514                     DVT prophylaxis: heparin injection 5,000 Units Start: 12/06/21 1400   Code Status:   Code Status: Full Code  Family Communication: patient  Disposition:    Dispo: The patient is from: home, lives with his mother              Anticipated d/c is to: home              Anticipated d/c date is: possible on 9/2 if blood glucose stable, able to tolerate diet  Antimicrobials:    Anti-infectives (From admission, onward)    Start     Dose/Rate Route Frequency Ordered Stop   12/07/21 1000  dapsone tablet 100 mg        100 mg Oral Daily 12/06/21 1139     12/06/21 1145  dolutegravir-lamiVUDine (DOVATO) 50-300 MG per tablet 1 tablet        1 tablet Oral Daily 12/06/21 1139     12/06/21 1145  doravirine (PIFELTRO) tablet 100 mg        100 mg Oral Daily 12/06/21 1139            Objective: Vitals:   12/07/21 1000 12/07/21 1100 12/07/21 1600 12/07/21 1939  BP: 135/87 134/83 (!) 108/51 (!) 94/58  Pulse:   73 75  Resp: (!) 21 16 20 18   Temp:    98.2 F (36.8 C)  TempSrc:    Oral  SpO2:    98%    Intake/Output Summary (Last 24 hours) at 12/07/2021 2201 Last data filed at 12/06/2021 2300 Gross per 24 hour  Intake --  Output 200 ml   Net -200 ml   There were no vitals filed for this visit.  Examination:  General exam: alert, awake, communicative,calm, NAD Respiratory system: Clear to auscultation. Respiratory effort normal. Cardiovascular system:  RRR.  Gastrointestinal system: Abdomen is nondistended, soft and nontender.  Normal bowel sounds heard. Central nervous system: Alert and oriented. No focal neurological deficits. Extremities:  no edema Skin: No rashes, lesions or ulcers Psychiatry: Judgement and insight appear normal. Mood & affect appropriate.     Data Reviewed: I have personally reviewed  labs and visualized  imaging studies since the last encounter and formulate the plan        Scheduled Meds:  atorvastatin  80 mg Oral Daily   carvedilol  6.25 mg Oral BID   clopidogrel  75 mg Oral Daily   dapsone  100 mg Oral Daily   dolutegravir-lamiVUDine  1 tablet Oral Daily   doravirine  100 mg Oral Daily   ezetimibe  10 mg Oral Daily   heparin  5,000 Units Subcutaneous Q8H   insulin aspart  0-9 Units Subcutaneous TID WC   insulin aspart  3 Units Subcutaneous TID WC   [START ON 12/08/2021] insulin glargine-yfgn  20 Units Subcutaneous QHS   pantoprazole  40 mg Oral Daily   senna-docusate  1 tablet Oral BID   sodium bicarbonate  650 mg Oral BID   Continuous Infusions:   LOS: 1 day     Florencia Reasons, MD PhD FACP [facility]  Available via Epic secure chat 7am-7pm for nonurgent issues Please page for urgent issues To page the attending provider between 7A-7P or the covering provider during after hours 7P-7A, please log into the web site [email or web address] and access using universal [place] password for that web site. If you do not have the password, please call the hospital operator.    12/07/2021, 10:01 PM

## 2021-12-08 DIAGNOSIS — N189 Chronic kidney disease, unspecified: Secondary | ICD-10-CM

## 2021-12-08 DIAGNOSIS — I10 Essential (primary) hypertension: Secondary | ICD-10-CM | POA: Diagnosis not present

## 2021-12-08 DIAGNOSIS — E1100 Type 2 diabetes mellitus with hyperosmolarity without nonketotic hyperglycemic-hyperosmolar coma (NKHHC): Secondary | ICD-10-CM | POA: Diagnosis not present

## 2021-12-08 DIAGNOSIS — R197 Diarrhea, unspecified: Secondary | ICD-10-CM | POA: Diagnosis not present

## 2021-12-08 LAB — CBC WITH DIFFERENTIAL/PLATELET
Abs Immature Granulocytes: 0.03 10*3/uL (ref 0.00–0.07)
Basophils Absolute: 0 10*3/uL (ref 0.0–0.1)
Basophils Relative: 0 %
Eosinophils Absolute: 0.2 10*3/uL (ref 0.0–0.5)
Eosinophils Relative: 3 %
HCT: 33.1 % — ABNORMAL LOW (ref 39.0–52.0)
Hemoglobin: 11 g/dL — ABNORMAL LOW (ref 13.0–17.0)
Immature Granulocytes: 0 %
Lymphocytes Relative: 17 %
Lymphs Abs: 1.3 10*3/uL (ref 0.7–4.0)
MCH: 29.1 pg (ref 26.0–34.0)
MCHC: 33.2 g/dL (ref 30.0–36.0)
MCV: 87.6 fL (ref 80.0–100.0)
Monocytes Absolute: 0.6 10*3/uL (ref 0.1–1.0)
Monocytes Relative: 8 %
Neutro Abs: 5.5 10*3/uL (ref 1.7–7.7)
Neutrophils Relative %: 72 %
Platelets: 238 10*3/uL (ref 150–400)
RBC: 3.78 MIL/uL — ABNORMAL LOW (ref 4.22–5.81)
RDW: 14 % (ref 11.5–15.5)
WBC: 7.7 10*3/uL (ref 4.0–10.5)
nRBC: 0 % (ref 0.0–0.2)

## 2021-12-08 LAB — BASIC METABOLIC PANEL
Anion gap: 7 (ref 5–15)
BUN: 39 mg/dL — ABNORMAL HIGH (ref 6–20)
CO2: 20 mmol/L — ABNORMAL LOW (ref 22–32)
Calcium: 8.5 mg/dL — ABNORMAL LOW (ref 8.9–10.3)
Chloride: 105 mmol/L (ref 98–111)
Creatinine, Ser: 4.06 mg/dL — ABNORMAL HIGH (ref 0.61–1.24)
GFR, Estimated: 16 mL/min — ABNORMAL LOW (ref >60)
Glucose, Bld: 355 mg/dL — ABNORMAL HIGH (ref 70–99)
Potassium: 3.8 mmol/L (ref 3.5–5.1)
Sodium: 132 mmol/L — ABNORMAL LOW (ref 135–145)

## 2021-12-08 LAB — GLUCOSE, CAPILLARY
Glucose-Capillary: 477 mg/dL — ABNORMAL HIGH (ref 70–99)
Glucose-Capillary: 318 mg/dL — ABNORMAL HIGH (ref 70–99)
Glucose-Capillary: 186 mg/dL — ABNORMAL HIGH (ref 70–99)
Glucose-Capillary: 185 mg/dL — ABNORMAL HIGH (ref 70–99)

## 2021-12-08 LAB — URINE CULTURE: Culture: >=100000 — AB

## 2021-12-08 LAB — MAGNESIUM: Magnesium: 1.9 mg/dL (ref 1.7–2.4)

## 2021-12-08 LAB — HEMOGLOBIN A1C
Hgb A1c MFr Bld: 11.5 % — ABNORMAL HIGH (ref 4.8–5.6)
Mean Plasma Glucose: 283.35 mg/dL

## 2021-12-08 MED ORDER — INSULIN GLARGINE-YFGN 100 UNIT/ML ~~LOC~~ SOLN
25.0000 [IU] | Freq: Every day | SUBCUTANEOUS | Status: DC
Start: 2021-12-09 — End: 2021-12-09
  Administered 2021-12-09: 25 [IU] via SUBCUTANEOUS
  Filled 2021-12-08: qty 0.25

## 2021-12-08 MED ORDER — INSULIN GLARGINE-YFGN 100 UNIT/ML ~~LOC~~ SOLN
25.0000 [IU] | Freq: Every day | SUBCUTANEOUS | Status: DC
  Administered 2021-12-08: 25 [IU] via SUBCUTANEOUS
  Filled 2021-12-08 (×2): qty 0.25

## 2021-12-08 MED ORDER — INSULIN ASPART 100 UNIT/ML IJ SOLN
20.0000 [IU] | Freq: Once | INTRAMUSCULAR | Status: AC
  Administered 2021-12-08: 20 [IU] via SUBCUTANEOUS

## 2021-12-08 MED ORDER — INSULIN ASPART 100 UNIT/ML IJ SOLN
6.0000 [IU] | Freq: Three times a day (TID) | INTRAMUSCULAR | Status: DC
Start: 2021-12-08 — End: 2021-12-09
  Administered 2021-12-08 – 2021-12-09 (×5): 6 [IU] via SUBCUTANEOUS

## 2021-12-08 NOTE — Progress Notes (Signed)
PROGRESS NOTE        PATIENT DETAILS Name: Keith [name] Age: 56 y.o. Sex: male Date of Birth: [birth year] Admit Date: 12/05/2021 Admitting Physician Florencia Reasons, MD [id number], Keith Foley, NP  Brief Summary: Patient is a 56 y.o.  male with history of HIV, HTN, HLD, DM-2, CKD 4, bilateral BKA-who presented with uncontrolled hyperglycemia with hyperosmolar hyper nonketotic state.  Started on IV insulin-and subsequently admitted to the hospitalist service.   Significant events: 8/30>> admitted with uncontrolled hyperglycemia.  Significant studies: 9/31>> x-ray abdomen: Moderate stool/gas in the rectum-scattered gas-filled loops in the colon with mild colonic stools in the area of the descending colon.  Significant microbiology data: None  Procedures: None  Consults: None  Subjective: Lying comfortably in bed-denies any chest pain or shortness of breath.  Having numerous loose stools.  Objective: Vitals: Blood pressure 119/64, pulse 81, temperature 98.3 F (36.8 C), temperature source Oral, resp. rate 20, SpO2 93 %.   Exam: Gen Exam:Alert awake-not in any distress HEENT:atraumatic, normocephalic Chest: B/L clear to auscultation anteriorly CVS:S1S2 regular Abdomen:soft non tender, non distended Extremities: Bilateral BKA. Neurology: Non focal Skin: no rash  Pertinent Labs/Radiology:    Latest Ref Rng & Units 12/08/2021    5:32 AM 12/06/2021    2:22 AM 12/05/2021   11:07 PM  CBC  WBC 4.0 - 10.5 K/uL 7.7   11.4   Hemoglobin 13.0 - 17.0 g/dL 11.0  12.9  11.5   Hematocrit 39.0 - 52.0 % 33.1  38.0  35.0   Platelets 150 - 400 K/uL 238   257     Lab Results  Component Value Date   NA 132 (L) 12/08/2021   K 3.8 12/08/2021   CL 105 12/08/2021   CO2 20 (L) 12/08/2021      Assessment/Plan: Uncontrolled DM-2 with hyperosmolar nonketotic hyperglycemia (A1c 11.5 on 9/2): Off insulin drip but CBG still significantly elevated today-change Semglee to 25  units daily-starting now-add 4 units of NovoLog with meals and continue SSI.  Diarrhea: KUB on admission showed moderate stool burden in the rectum-suspect this could be related to constipation.  Having pretty frequent stools this morning-stopping Senokot.  Okay to place rectal tube-watch closely for now-no need to pursue stool studies unless this is persistent for several days.  CKD stage IV: At baseline.  History of CAD: No anginal symptoms-continue Plavix/statin/Coreg  HTN: BP stable-continue Coreg  HIV: Continue antiretrovirals/dapsone  Right BKA/left AKA   Morbid Obesity: Estimated body mass index is 37.31 kg/m as calculated from the following:   Height as of 11/15/21: 5\' 10"  (1.778 m).   Weight as of 11/15/21: 117.9 kg.   Code status:   Code Status: Full Code   DVT Prophylaxis: heparin injection 5,000 Units Start: 12/06/21 1400   Family Communication: None at bedside   Disposition Plan: Status is: Inpatient Remains inpatient appropriate because: Severe dependence-still with significant hyperglycemia this morning.  Needs inpatient optimization of glycemic regimen before discharge.   Planned Discharge Destination:Home   Diet: Diet Order             Diet renal/carb modified with fluid restriction Diet-HS Snack? Nothing; Fluid restriction: 1200 mL Fluid; Room service appropriate? Yes; Fluid consistency: Thin  Diet effective now                     Antimicrobial agents:  Anti-infectives (From admission, onward)    Start     Dose/Rate Route Frequency Ordered Stop   12/07/21 1000  dapsone tablet 100 mg        100 mg Oral Daily 12/06/21 1139     12/06/21 1145  dolutegravir-lamiVUDine (DOVATO) 50-300 MG per tablet 1 tablet        1 tablet Oral Daily 12/06/21 1139     12/06/21 1145  doravirine (PIFELTRO) tablet 100 mg        100 mg Oral Daily 12/06/21 1139          MEDICATIONS: Scheduled Meds:  atorvastatin  80 mg Oral Daily   carvedilol  6.25 mg Oral BID    clopidogrel  75 mg Oral Daily   dapsone  100 mg Oral Daily   dolutegravir-lamiVUDine  1 tablet Oral Daily   doravirine  100 mg Oral Daily   ezetimibe  10 mg Oral Daily   heparin  5,000 Units Subcutaneous Q8H   insulin aspart  0-9 Units Subcutaneous TID WC   insulin aspart  6 Units Subcutaneous TID WC   insulin glargine-yfgn  25 Units Subcutaneous QHS   pantoprazole  40 mg Oral Daily   senna-docusate  1 tablet Oral BID   sodium bicarbonate  650 mg Oral BID   Continuous Infusions: PRN Meds:.acetaminophen **OR** acetaminophen, dextrose, hydrALAZINE, labetalol, ondansetron **OR** ondansetron (ZOFRAN) IV   I have personally reviewed following labs and imaging studies  LABORATORY DATA: CBC: Recent Labs  Lab 12/05/21 2307 12/06/21 0222 12/08/21 0532  WBC 11.4*  --  7.7  NEUTROABS 9.0*  --  5.5  HGB 11.5* 12.9* 11.0*  HCT 35.0* 38.0* 33.1*  MCV 88.8  --  87.6  PLT 257  --  103    Basic Metabolic Panel: Recent Labs  Lab 12/06/21 1203 12/06/21 1812 12/06/21 2153 12/07/21 0739 12/08/21 0532  NA 137 135 133* 133* 132*  K 3.6 4.6 3.9 3.7 3.8  CL 106 102 105 103 105  CO2 23 17* 18* 19* 20*  GLUCOSE 123* 292* 294* 426* 355*  BUN 38* 39* 39* 37* 39*  CREATININE 3.68* 3.66* 3.76* 3.86* 4.06*  CALCIUM 9.1 9.3 8.7* 8.7* 8.5*  MG  --   --   --   --  1.9    GFR: CrCl cannot be calculated (Unknown ideal weight.).  Liver Function Tests: Recent Labs  Lab 12/05/21 2307  AST 20  ALT 15  ALKPHOS 115  BILITOT 1.1  PROT 6.7  ALBUMIN 2.7*   No results for input(s): "LIPASE", "AMYLASE" in the last 168 hours. No results for input(s): "AMMONIA" in the last 168 hours.  Coagulation Profile: No results for input(s): "INR", "PROTIME" in the last 168 hours.  Cardiac Enzymes: No results for input(s): "CKTOTAL", "CKMB", "CKMBINDEX", "TROPONINI" in the last 168 hours.  BNP (last 3 results) No results for input(s): "PROBNP" in the last 8760 hours.  Lipid Profile: No results  for input(s): "CHOL", "HDL", "LDLCALC", "TRIG", "CHOLHDL", "LDLDIRECT" in the last 72 hours.  Thyroid Function Tests: No results for input(s): "TSH", "T4TOTAL", "FREET4", "T3FREE", "THYROIDAB" in the last 72 hours.  Anemia Panel: No results for input(s): "VITAMINB12", "FOLATE", "FERRITIN", "TIBC", "IRON", "RETICCTPCT" in the last 72 hours.  Urine analysis:    Component Value Date/Time   COLORURINE YELLOW 12/06/2021 0208   APPEARANCEUR CLOUDY (A) 12/06/2021 0208   LABSPEC 1.014 12/06/2021 0208   PHURINE 5.0 12/06/2021 0208   GLUCOSEU >=500 (A) 12/06/2021 0208   HGBUR MODERATE (A)  12/06/2021 0208   BILIRUBINUR NEGATIVE 12/06/2021 0208   BILIRUBINUR negative 09/07/2018 [place] 12/06/2021 0208   PROTEINUR >=300 (A) 12/06/2021 0208   UROBILINOGEN 1.0 09/07/2018 1034   UROBILINOGEN 1.0 01/01/2017 1032   NITRITE NEGATIVE 12/06/2021 0208   LEUKOCYTESUR LARGE (A) 12/06/2021 0208    Sepsis Labs: Lactic Acid, Venous    Component Value Date/Time   LATICACIDVEN 1.2 10/18/2020 0418    MICROBIOLOGY: Recent Results (from the past 240 hour(s))  Urine Culture     Status: Abnormal   Collection Time: 12/06/21  8:30 AM   Specimen: Urine, Clean Catch  Result Value Ref Range Status   Specimen Description URINE, CLEAN CATCH  Final   Special Requests   Final    NONE Performed at [facility], [place] [address] [place], [place] [address]    Culture (A)  Final    >=100,000 COLONIES/mL KLEBSIELLA PNEUMONIAE Confirmed Extended Spectrum Beta-Lactamase Producer (ESBL).  In bloodstream infections from ESBL organisms, carbapenems are preferred over piperacillin/tazobactam. They are shown to have a lower risk of mortality.    Report Status 12/08/2021 FINAL  Final   Organism ID, Bacteria KLEBSIELLA PNEUMONIAE (A)  Final      Susceptibility   Klebsiella pneumoniae - MIC*    AMPICILLIN >=32 RESISTANT Resistant     CEFAZOLIN >=64 RESISTANT Resistant     CEFEPIME >=32 RESISTANT  Resistant     CEFTRIAXONE >=64 RESISTANT Resistant     CIPROFLOXACIN 1 RESISTANT Resistant     GENTAMICIN >=16 RESISTANT Resistant     IMIPENEM <=0.25 SENSITIVE Sensitive     NITROFURANTOIN 64 INTERMEDIATE Intermediate     TRIMETH/SULFA >=320 RESISTANT Resistant     AMPICILLIN/SULBACTAM >=32 RESISTANT Resistant     PIP/TAZO 16 SENSITIVE Sensitive     * >=100,000 COLONIES/mL KLEBSIELLA PNEUMONIAE    RADIOLOGY STUDIES/RESULTS: DG Abd 1 View  Result Date: 12/07/2021 CLINICAL DATA:  A 56 year old male presents with nausea and vomiting. EXAM: ABDOMEN - 1 VIEW COMPARISON:  December of 2017. FINDINGS: Incidental imaging of the lung bases is unremarkable. Moderate stool and gas in the rectum. Scattered gas-filled loops of colon also with mild colonic stool in the area of the ascending colon. Due to patient body habitus a portion of the RIGHT flank is excluded. There is scattered gas-filled loops of small bowel as well but without substantial distension. On limited assessment there is no acute skeletal process IMPRESSION: Query mild constipation. Scattered gas-filled loops of small bowel may reflect mild ileus. No signs of obstruction. RIGHT flank partially excluded from view on today's evaluation. Correlate with any localizing symptoms in this area or signs of hernia. Could consider repeat imaging as warranted. Electronically Signed   By: Zetta Bills M.D.   On: 12/07/2021 09:52     LOS: 2 days   Oren Binet, MD  [facility]    To contact the attending provider between 7A-7P or the covering provider during after hours 7P-7A, please log into the web site [email or web address] and access using universal [place] password for that web site. If you do not have the password, please call the hospital operator.  12/08/2021, 10:39 AM

## 2021-12-08 NOTE — Progress Notes (Signed)
PT Cancellation Note  Patient Details Name: Keith [name] MRN: [id number] DOB: [birth year]   Cancelled Treatment:    Reason Eval/Treat Not Completed: Other (comment)  Patient unable to be evaluated for full mobility due to bil silicone sleeves for his prostheses were soiled. Washed sleeves and hung them to dry. Should be dry by tomorrow morning for full PT eval.    Arby Barrette, PT Acute Rehabilitation Services  Office [phone]   Rexanne Mano 12/08/2021, 2:03 PM

## 2021-12-09 DIAGNOSIS — N189 Chronic kidney disease, unspecified: Secondary | ICD-10-CM | POA: Diagnosis not present

## 2021-12-09 DIAGNOSIS — I10 Essential (primary) hypertension: Secondary | ICD-10-CM | POA: Diagnosis not present

## 2021-12-09 DIAGNOSIS — R197 Diarrhea, unspecified: Secondary | ICD-10-CM | POA: Diagnosis not present

## 2021-12-09 DIAGNOSIS — E1100 Type 2 diabetes mellitus with hyperosmolarity without nonketotic hyperglycemic-hyperosmolar coma (NKHHC): Secondary | ICD-10-CM | POA: Diagnosis not present

## 2021-12-09 LAB — GLUCOSE, CAPILLARY
Glucose-Capillary: 295 mg/dL — ABNORMAL HIGH (ref 70–99)
Glucose-Capillary: 332 mg/dL — ABNORMAL HIGH (ref 70–99)

## 2021-12-09 MED ORDER — INSULIN ASPART 100 UNIT/ML IJ SOLN
4.0000 [IU] | Freq: Three times a day (TID) | INTRAMUSCULAR | 11 refills | Status: DC
Start: 2021-12-09 — End: 2022-01-01

## 2021-12-09 MED ORDER — INSULIN GLARGINE-YFGN 100 UNIT/ML ~~LOC~~ SOLN: 28.0000 [IU] | Freq: Every day | SUBCUTANEOUS | 11 refills | Status: DC

## 2021-12-09 NOTE — Evaluation (Signed)
Physical Therapy Evaluation and Discharge Patient Details Name: Keith [name] MRN: [id number] DOB: [birth year] Today's Date: 12/09/2021  History of Present Illness  56 year old African-American male presents to the ER 12/06/21 with elevated blood sugars.    PMHx:  R BKA, L AKA, AIDS, CVA, CAD, MI, HTN, CKD, DM2, PVD, CKD  Clinical Impression   Patient evaluated by Physical Therapy with no further acute PT needs identified. Pt able to independently don bil prostheses and ambulate with RW modified independent. Patient ready for discharge but states he does not have a ride home. His car and RW are at home as he came by ambulance. Notified RN and LCSW re: transportation issue.  PT is signing off. Thank you for this referral.        Recommendations for follow up therapy are one component of a multi-disciplinary discharge planning process, led by the attending physician.  Recommendations may be updated based on patient status, additional functional criteria and insurance authorization.  Follow Up Recommendations No PT follow up      Assistance Recommended at Discharge None  Patient can return home with the following       Equipment Recommendations None recommended by PT  Recommendations for Other Services  Other (comment) (Social work for transportation issues)    Functional Status Assessment Patient has not had a recent decline in their functional status     Precautions / Restrictions Precautions Precautions: Fall Required Braces or Orthoses:  (bil prostheses)      Mobility  Bed Mobility Overal bed mobility: Independent             General bed mobility comments: from/to supine    Transfers Overall transfer level: Modified independent Equipment used: Rolling walker (2 wheels) (bil prostheses)               General transfer comment: pt stood from elevated bed to simulate home environment    Ambulation/Gait Ambulation/Gait assistance: Modified independent  (Device/Increase time) Gait Distance (Feet): 40 Feet Assistive device: Rolling walker (2 wheels) Gait Pattern/deviations: Step-to pattern, Wide base of support   Gait velocity interpretation: 1.31 - 2.62 ft/sec, indicative of limited community ambulator   General Gait Details: slow, cautious gait; no imbalance, able to negotiate around obstacles in room  Stairs            Wheelchair Mobility    Modified Rankin (Stroke Patients Only)       Balance Overall balance assessment: Modified Independent                                           Pertinent Vitals/Pain Pain Assessment Pain Assessment: No/denies pain    Home Living Family/patient expects to be discharged to:: Private residence Living Arrangements: Parent Available Help at Discharge: Family;Available 24 hours/day Type of Home: House Home Access: Stairs to enter Entrance Stairs-Rails: Psychiatric nurse of Steps: 3   Home Layout: One level Home Equipment: Geographical information systems officer (4 wheels)      Prior Function Prior Level of Function : Independent/Modified Independent             Mobility Comments: Uses rollator and bilateral prosthetic for short distance ambulation. Uses WC for further mobility       Hand Dominance   Dominant Hand: Right    Extremity/Trunk Assessment   Upper Extremity Assessment Upper Extremity Assessment: Defer to OT evaluation;Overall [facility] for  tasks assessed    Lower Extremity Assessment Lower Extremity Assessment: Overall WFL for tasks assessed (R BKA; L AKA)    Cervical / Trunk Assessment Cervical / Trunk Assessment: Other exceptions Cervical / Trunk Exceptions: overweight  Communication   Communication: No difficulties  Cognition Arousal/Alertness: Awake/alert Behavior During Therapy: WFL for tasks assessed/performed Overall Cognitive Status: Within Functional Limits for tasks assessed                                           General Comments General comments (skin integrity, edema, etc.): Notified RN and SW that pt does not have a ride home.    Exercises     Assessment/Plan    PT Assessment Patient does not need any further PT services  PT Problem List         PT Treatment Interventions      PT Goals (Current goals can be found in the Care Plan section)  Acute Rehab PT Goals Patient Stated Goal: go home today PT Goal Formulation: All assessment and education complete, DC therapy    Frequency       Co-evaluation               AM-PAC PT "6 Clicks" Mobility  Outcome Measure Help needed turning from your back to your side while in a flat bed without using bedrails?: None Help needed moving from lying on your back to sitting on the side of a flat bed without using bedrails?: None Help needed moving to and from a bed to a chair (including a wheelchair)?: None Help needed standing up from a chair using your arms (e.g., wheelchair or bedside chair)?: None Help needed to walk in hospital room?: None Help needed climbing 3-5 steps with a railing? : None 6 Click Score: 24    End of Session   Activity Tolerance: Patient tolerated treatment well Patient left: in bed;with call bell/phone within reach Nurse Communication: Mobility status;Other (comment) (needs ride home) PT Visit Diagnosis: Difficulty in walking, not elsewhere classified (R26.2)    Time: 0511-0211 PT Time Calculation (min) (ACUTE ONLY): 28 min   Charges:   PT Evaluation $PT Eval Moderate Complexity: 1 Mod PT Treatments $Gait Training: 8-22 mins         Arby Barrette, PT Acute Rehabilitation Services  Office [phone]   Rexanne Mano 12/09/2021, 12:23 PM

## 2021-12-09 NOTE — Progress Notes (Signed)
Discharge instructions, RX's and follow up appts explained and provided to patient verbalized understanding.  Teegan Guinther, Tivis Ringer, RN

## 2021-12-09 NOTE — Plan of Care (Signed)
Problem: Education: Goal: Ability to describe self-care measures that may prevent or decrease complications (Diabetes Survival Skills Education) will improve Outcome: Adequate for Discharge Goal: Individualized Educational Video(s) Outcome: Adequate for Discharge   Problem: Coping: Goal: Ability to adjust to condition or change in health will improve Outcome: Adequate for Discharge   Problem: Fluid Volume: Goal: Ability to maintain a balanced intake and output will improve Outcome: Adequate for Discharge   Problem: Health Behavior/Discharge Planning: Goal: Ability to identify and utilize available resources and services will improve Outcome: Adequate for Discharge Goal: Ability to manage health-related needs will improve Outcome: Adequate for Discharge   Problem: Metabolic: Goal: Ability to maintain appropriate glucose levels will improve Outcome: Adequate for Discharge   Problem: Nutritional: Goal: Maintenance of adequate nutrition will improve Outcome: Adequate for Discharge Goal: Progress toward achieving an optimal weight will improve Outcome: Adequate for Discharge   Problem: Nutritional: Goal: Maintenance of adequate nutrition will improve Outcome: Adequate for Discharge Goal: Progress toward achieving an optimal weight will improve Outcome: Adequate for Discharge   Problem: Skin Integrity: Goal: Risk for impaired skin integrity will decrease Outcome: Adequate for Discharge   Problem: Tissue Perfusion: Goal: Adequacy of tissue perfusion will improve Outcome: Adequate for Discharge   Problem: Education: Goal: Ability to describe self-care measures that may prevent or decrease complications (Diabetes Survival Skills Education) will improve Outcome: Adequate for Discharge Goal: Individualized Educational Video(s) Outcome: Adequate for Discharge   Problem: Cardiac: Goal: Ability to maintain an adequate cardiac output will improve Outcome: Adequate for  Discharge   Problem: Health Behavior/Discharge Planning: Goal: Ability to identify and utilize available resources and services will improve Outcome: Adequate for Discharge Goal: Ability to manage health-related needs will improve Outcome: Adequate for Discharge   Problem: Fluid Volume: Goal: Ability to achieve a balanced intake and output will improve Outcome: Adequate for Discharge   Problem: Metabolic: Goal: Ability to maintain appropriate glucose levels will improve Outcome: Adequate for Discharge   Problem: Nutritional: Goal: Maintenance of adequate nutrition will improve Outcome: Adequate for Discharge Goal: Maintenance of adequate weight for body size and type will improve Outcome: Adequate for Discharge   Problem: Respiratory: Goal: Will regain and/or maintain adequate ventilation Outcome: Adequate for Discharge   Problem: Urinary Elimination: Goal: Ability to achieve and maintain adequate renal perfusion and functioning will improve Outcome: Adequate for Discharge   Problem: Education: Goal: Knowledge of General Education information will improve Description: Including pain rating scale, medication(s)/side effects and non-pharmacologic comfort measures Outcome: Adequate for Discharge   Problem: Health Behavior/Discharge Planning: Goal: Ability to manage health-related needs will improve Outcome: Adequate for Discharge   Problem: Clinical Measurements: Goal: Ability to maintain clinical measurements within normal limits will improve Outcome: Adequate for Discharge Goal: Will remain free from infection Outcome: Adequate for Discharge Goal: Diagnostic test results will improve Outcome: Adequate for Discharge Goal: Respiratory complications will improve Outcome: Adequate for Discharge Goal: Cardiovascular complication will be avoided Outcome: Adequate for Discharge   Problem: Activity: Goal: Risk for activity intolerance will decrease Outcome: Adequate for  Discharge   Problem: Nutrition: Goal: Adequate nutrition will be maintained Outcome: Adequate for Discharge   Problem: Coping: Goal: Level of anxiety will decrease Outcome: Adequate for Discharge   Problem: Elimination: Goal: Will not experience complications related to bowel motility Outcome: Adequate for Discharge Goal: Will not experience complications related to urinary retention Outcome: Adequate for Discharge   Problem: Pain Managment: Goal: General experience of comfort will improve Outcome: Adequate for Discharge  Problem: Safety: Goal: Ability to remain free from injury will improve Outcome: Adequate for Discharge   Problem: Skin Integrity: Goal: Risk for impaired skin integrity will decrease Outcome: Adequate for Discharge

## 2021-12-09 NOTE — Discharge Summary (Signed)
PATIENT DETAILS Name: Keith [name] Age: 56 y.o. Sex: male Date of Birth: 1965/07/20 MRN: [id number]. Admitting Physician: Florencia Reasons, MD LFY:BOFB, [name], NP  Admit Date: 12/05/2021 Discharge date: 12/09/2021  Recommendations for Outpatient Follow-up:  Follow up with PCP in 1-2 weeks Please obtain CMP/CBC in one week Continue to optimize insulin regimen in the outpatient setting.   Admitted From:  Home  Disposition: Home   Discharge Condition: fair  CODE STATUS:   Code Status: Full Code   Diet recommendation:  Diet Order             Diet - low sodium heart healthy           Diet Carb Modified           Diet renal/carb modified with fluid restriction Diet-HS Snack? Nothing; Fluid restriction: 1200 mL Fluid; Room service appropriate? Yes; Fluid consistency: Thin  Diet effective now                    Brief Summary: Patient is a 56 y.o.  male with history of HIV, HTN, HLD, DM-2, CKD 4, bilateral BKA-who presented with uncontrolled hyperglycemia with hyperosmolar hyper nonketotic state.  Started on IV insulin-and subsequently admitted to the hospitalist service.     Significant events: 8/30>> admitted with uncontrolled hyperglycemia.   Significant studies: 9/31>> x-ray abdomen: Moderate stool/gas in the rectum-scattered gas-filled loops in the colon with mild colonic stools in the area of the descending colon.   Significant microbiology data: None   Procedures: None   Consults: None  Brief Hospital Course: Uncontrolled DM-2 with hyperosmolar nonketotic hyperglycemia (A1c 11.5 on 9/2): Initially required insulin infusion to control CBGs-subsequently transition to SQ insulin-CBGs more better-on discharge-Lantus will be increased to 28 units, he will continue his scheduled Premeal NovoLog and SSI like before.  He will need to follow-up with his primary care practitioner for further optimization.  Diarrhea: KUB on admission showed moderate stool burden in  the rectum-suspect this could be related to constipation.  Had numerous stools yesterday-this morning patient feels much better.  No longer any laxatives.    CKD stage IV: At baseline.   History of CAD: No anginal symptoms-continue Plavix/statin/Coreg   HTN: BP stable-continue Coreg   HIV: Continue antiretrovirals/dapsone   Right BKA/left AKA: Walks with prosthesis.    Morbid Obesity: Estimated body mass index is 37.31 kg/m as calculated from the following:   Height as of 11/15/21: 5\' 10"  (1.778 m).   Weight as of 11/15/21: 117.9 kg.     Discharge Diagnoses:  Principal Problem:   Uncontrolled type 2 diabetes mellitus with hyperosmolar nonketotic hyperglycemia (HCC) Active Problems:   Diarrhea   HTN (hypertension)   HIV disease (HCC)   CKD (chronic kidney disease) stage 4, GFR 15-29 ml/min (HCC)   Hx of BKA, right ([place])   History of left above knee amputation ([place])   Hyperglycemia   Discharge Instructions:  Activity:  As tolerated with Full fall precautions use walker/cane & assistance as needed   Discharge Instructions     Call MD for:  extreme fatigue   Complete by: As directed    Call MD for:  persistant dizziness or light-headedness   Complete by: As directed    Diet - low sodium heart healthy   Complete by: As directed    Diet Carb Modified   Complete by: As directed    Discharge instructions   Complete by: As directed    Follow with Primary  MD  [name], NP in 1-2 weeks  Check your CBGs multiple times a day-keep a record of these readings and take it to your next appointment with your PCP.  Please get a complete blood count and chemistry panel checked by your Primary MD at your next visit, and again as instructed by your Primary MD.  Get Medicines reviewed and adjusted: Please take all your medications with you for your next visit with your Primary MD  Laboratory/radiological data: Please request your Primary MD to go over all hospital tests and  procedure/radiological results at the follow up, please ask your Primary MD to get all Hospital records sent to his/her office.  In some cases, they will be blood work, cultures and biopsy results pending at the time of your discharge. Please request that your primary care M.D. follows up on these results.  Also Note the following: If you experience worsening of your admission symptoms, develop shortness of breath, life threatening emergency, suicidal or homicidal thoughts you must seek medical attention immediately by calling 911 or calling your MD immediately  if symptoms less severe.  You must read complete instructions/literature along with all the possible adverse reactions/side effects for all the Medicines you take and that have been prescribed to you. Take any new Medicines after you have completely understood and accpet all the possible adverse reactions/side effects.   Do not drive when taking Pain medications or sleeping medications (Benzodaizepines)  Do not take more than prescribed Pain, Sleep and Anxiety Medications. It is not advisable to combine anxiety,sleep and pain medications without talking with your primary care practitioner  Special Instructions: If you have smoked or chewed Tobacco  in the last 2 yrs please stop smoking, stop any regular Alcohol  and or any Recreational drug use.  Wear Seat belts while driving.  Please note: You were cared for by a hospitalist during your hospital stay. Once you are discharged, your primary care physician will handle any further medical issues. Please note that NO REFILLS for any discharge medications will be authorized once you are discharged, as it is imperative that you return to your primary care physician (or establish a relationship with a primary care physician if you do not have one) for your post hospital discharge needs so that they can reassess your need for medications and monitor your lab values.   Increase activity slowly    Complete by: As directed       Allergies as of 12/09/2021       Reactions   Elemental Sulfur Itching   Patient stated he's allergic to "sulfur" AND "sulfa"   Sulfa Antibiotics Itching        Medication List     TAKE these medications    allopurinol 100 MG tablet Commonly known as: ZYLOPRIM Take 1 tablet (100 mg total) by mouth daily.   aspirin EC 81 MG tablet Take 81 mg by mouth daily. Swallow whole.   atorvastatin 80 MG tablet Commonly known as: LIPITOR Take 1 tablet (80 mg total) by mouth daily.   carvedilol 3.125 MG tablet Commonly known as: COREG Take 3.125 mg by mouth 2 (two) times daily with a meal. What changed: Another medication with the same name was removed. Continue taking this medication, and follow the directions you see here.   clopidogrel 75 MG tablet Commonly known as: PLAVIX Take 1 tablet (75 mg total) by mouth daily.   cyclobenzaprine 10 MG tablet Commonly known as: FLEXERIL Take 10 mg by mouth  daily as needed for muscle spasms.   dapsone 100 MG tablet TAKE 1 TABLET (100 MG TOTAL) BY MOUTH DAILY.   doravirine 100 MG Tabs tablet Commonly known as: PIFELTRO Take 1 tablet (100 mg total) by mouth daily.   Dovato 50-300 MG tablet Generic drug: dolutegravir-lamiVUDine Take 1 tablet by mouth daily.   escitalopram 20 MG tablet Commonly known as: LEXAPRO Take 1 tablet (20 mg total) by mouth daily.   ezetimibe 10 MG tablet Commonly known as: ZETIA Take 1 tablet (10 mg total) by mouth daily.   ferrous sulfate 325 (65 FE) MG tablet Take 1 tablet (325 mg total) by mouth every Monday, Wednesday, and Friday at 6 PM. Do not take with other medicines.   finasteride 5 MG tablet Commonly known as: PROSCAR Take 5 mg by mouth daily.   gabapentin 100 MG capsule Commonly known as: NEURONTIN Take 1 capsule (100 mg total) by mouth 3 (three) times daily. What changed:  how much to take when to take this   hydrALAZINE 10 MG tablet Commonly known as:  APRESOLINE Take 10 mg by mouth daily.   insulin aspart 100 UNIT/ML injection Commonly known as: novoLOG CBG 70 - 120: 0 units  CBG 121 - 150: 2 units  CBG 151 - 200: 3 units  CBG 201 - 250: 5 units  CBG 251 - 300: 8 units  CBG 301 - 350: 11 units  CBG 351 - 400: 15 units What changed:  how much to take how to take this when to take this   insulin aspart 100 UNIT/ML injection Commonly known as: novoLOG Inject 4 Units into the skin 3 (three) times daily with meals. What changed: how much to take   insulin glargine-yfgn 100 UNIT/ML injection Commonly known as: SEMGLEE Inject 0.28 mLs (28 Units total) into the skin daily. What changed: how much to take   loperamide 2 MG capsule Commonly known as: IMODIUM Take 1 capsule (2 mg total) by mouth 4 (four) times daily as needed for diarrhea or loose stools.   mupirocin cream 2 % Commonly known as: BACTROBAN Apply topically daily.   nitroGLYCERIN 0.4 MG SL tablet Commonly known as: NITROSTAT Place 0.4 mg under the tongue every 5 (five) minutes as needed for chest pain.   onetouch ultrasoft lancets Use as instructed   Oxycodone HCl 10 MG Tabs Take 10 mg by mouth every 4 (four) hours as needed (For pain).   pantoprazole 40 MG tablet Commonly known as: PROTONIX Take 1 tablet (40 mg total) by mouth daily.   senna 8.6 MG Tabs tablet Commonly known as: SENOKOT Take 2 tablets (17.2 mg total) by mouth at bedtime.   sodium bicarbonate 650 MG tablet Take 1 tablet (650 mg total) by mouth 2 (two) times daily.   Vascepa 1 g capsule Generic drug: icosapent Ethyl TAKE 2 CAPSULES (2 G TOTAL) BY MOUTH TWO TIMES DAILY. What changed:  how much to take how to take this when to take this        Allergies  Allergen Reactions   Elemental Sulfur Itching    Patient stated he's allergic to "sulfur" AND "sulfa"   Sulfa Antibiotics Itching     Other Procedures/Studies: DG Abd 1 View  Result Date: 12/07/2021 CLINICAL DATA:  A  56 year old male presents with nausea and vomiting. EXAM: ABDOMEN - 1 VIEW COMPARISON:  December of 2017. FINDINGS: Incidental imaging of the lung bases is unremarkable. Moderate stool and gas in the rectum. Scattered gas-filled loops of  colon also with mild colonic stool in the area of the ascending colon. Due to patient body habitus a portion of the RIGHT flank is excluded. There is scattered gas-filled loops of small bowel as well but without substantial distension. On limited assessment there is no acute skeletal process IMPRESSION: Query mild constipation. Scattered gas-filled loops of small bowel may reflect mild ileus. No signs of obstruction. RIGHT flank partially excluded from view on today's evaluation. Correlate with any localizing symptoms in this area or signs of hernia. Could consider repeat imaging as warranted. Electronically Signed   By: Zetta Bills M.D.   On: 12/07/2021 09:52     TODAY-DAY OF DISCHARGE:  Subjective:   Keith [name] today has no headache,no chest abdominal pain,no new weakness tingling or numbness, feels much better wants to go home today.   Objective:   Blood pressure (!) 142/87, pulse 82, temperature 98.3 F (36.8 C), temperature source Oral, resp. rate 16, SpO2 96 %. No intake or output data in the 24 hours ending 12/09/21 0934 There were no vitals filed for this visit.  Exam: Awake Alert, Oriented *3, No new F.N deficits, Normal affect [place].AT,PERRAL Supple Neck,No JVD, No cervical lymphadenopathy appriciated.  Symmetrical Chest wall movement, Good air movement bilaterally, CTAB RRR,No Gallops,Rubs or new Murmurs, No Parasternal Heave +ve B.Sounds, Abd Soft, Non tender, No organomegaly appriciated, No rebound -guarding or rigidity. No Cyanosis, Clubbing or edema, No new Rash or bruise   PERTINENT RADIOLOGIC STUDIES: DG Abd 1 View  Result Date: 12/07/2021 CLINICAL DATA:  A 56 year old male presents with nausea and vomiting. EXAM: ABDOMEN - 1 VIEW  COMPARISON:  December of 2017. FINDINGS: Incidental imaging of the lung bases is unremarkable. Moderate stool and gas in the rectum. Scattered gas-filled loops of colon also with mild colonic stool in the area of the ascending colon. Due to patient body habitus a portion of the RIGHT flank is excluded. There is scattered gas-filled loops of small bowel as well but without substantial distension. On limited assessment there is no acute skeletal process IMPRESSION: Query mild constipation. Scattered gas-filled loops of small bowel may reflect mild ileus. No signs of obstruction. RIGHT flank partially excluded from view on today's evaluation. Correlate with any localizing symptoms in this area or signs of hernia. Could consider repeat imaging as warranted. Electronically Signed   By: Zetta Bills M.D.   On: 12/07/2021 09:52     PERTINENT LAB RESULTS: CBC: Recent Labs    12/08/21 0532  WBC 7.7  HGB 11.0*  HCT 33.1*  PLT 238   CMET CMP     Component Value Date/Time   NA 132 (L) 12/08/2021 0532   NA 132 (L) 09/07/2018 1056   K 3.8 12/08/2021 0532   CL 105 12/08/2021 0532   CO2 20 (L) 12/08/2021 0532   GLUCOSE 355 (H) 12/08/2021 0532   BUN 39 (H) 12/08/2021 0532   BUN 21 09/07/2018 1056   CREATININE 4.06 (H) 12/08/2021 0532   CREATININE 3.51 (H) 08/17/2021 1215   CALCIUM 8.5 (L) 12/08/2021 0532   PROT 6.7 12/05/2021 2307   PROT 7.9 09/07/2018 1056   ALBUMIN 2.7 (L) 12/05/2021 2307   ALBUMIN 3.8 09/07/2018 1056   AST 20 12/05/2021 2307   ALT 15 12/05/2021 2307   ALKPHOS 115 12/05/2021 2307   BILITOT 1.1 12/05/2021 2307   BILITOT 0.3 09/07/2018 1056   GFRNONAA 16 (L) 12/08/2021 0532   GFRNONAA 27 (L) 01/20/2017 1244   GFRAA 35 (L) 08/23/2019 0100  GFRAA 31 (L) 01/20/2017 1244    GFR CrCl cannot be calculated (Unknown ideal weight.). No results for input(s): "LIPASE", "AMYLASE" in the last 72 hours. No results for input(s): "CKTOTAL", "CKMB", "CKMBINDEX", "TROPONINI" in the  last 72 hours. Invalid input(s): "POCBNP" No results for input(s): "DDIMER" in the last 72 hours. Recent Labs    12/08/21 0532  HGBA1C 11.5*   No results for input(s): "CHOL", "HDL", "LDLCALC", "TRIG", "CHOLHDL", "LDLDIRECT" in the last 72 hours. No results for input(s): "TSH", "T4TOTAL", "T3FREE", "THYROIDAB" in the last 72 hours.  Invalid input(s): "FREET3" No results for input(s): "VITAMINB12", "FOLATE", "FERRITIN", "TIBC", "IRON", "RETICCTPCT" in the last 72 hours. Coags: No results for input(s): "INR" in the last 72 hours.  Invalid input(s): "PT" Microbiology: Recent Results (from the past 240 hour(s))  Urine Culture     Status: Abnormal   Collection Time: 12/06/21  8:30 AM   Specimen: Urine, Clean Catch  Result Value Ref Range Status   Specimen Description URINE, CLEAN CATCH  Final   Special Requests   Final    NONE Performed at [facility], [place] [address] [place], [place] [address]    Culture (A)  Final    >=100,000 COLONIES/mL KLEBSIELLA PNEUMONIAE Confirmed Extended Spectrum Beta-Lactamase Producer (ESBL).  In bloodstream infections from ESBL organisms, carbapenems are preferred over piperacillin/tazobactam. They are shown to have a lower risk of mortality.    Report Status 12/08/2021 FINAL  Final   Organism ID, Bacteria KLEBSIELLA PNEUMONIAE (A)  Final      Susceptibility   Klebsiella pneumoniae - MIC*    AMPICILLIN >=32 RESISTANT Resistant     CEFAZOLIN >=64 RESISTANT Resistant     CEFEPIME >=32 RESISTANT Resistant     CEFTRIAXONE >=64 RESISTANT Resistant     CIPROFLOXACIN 1 RESISTANT Resistant     GENTAMICIN >=16 RESISTANT Resistant     IMIPENEM <=0.25 SENSITIVE Sensitive     NITROFURANTOIN 64 INTERMEDIATE Intermediate     TRIMETH/SULFA >=320 RESISTANT Resistant     AMPICILLIN/SULBACTAM >=32 RESISTANT Resistant     PIP/TAZO 16 SENSITIVE Sensitive     * >=100,000 COLONIES/mL KLEBSIELLA PNEUMONIAE    FURTHER DISCHARGE INSTRUCTIONS:  Get Medicines  reviewed and adjusted: Please take all your medications with you for your next visit with your Primary MD  Laboratory/radiological data: Please request your Primary MD to go over all hospital tests and procedure/radiological results at the follow up, please ask your Primary MD to get all Hospital records sent to his/her office.  In some cases, they will be blood work, cultures and biopsy results pending at the time of your discharge. Please request that your primary care M.D. goes through all the records of your hospital data and follows up on these results.  Also Note the following: If you experience worsening of your admission symptoms, develop shortness of breath, life threatening emergency, suicidal or homicidal thoughts you must seek medical attention immediately by calling 911 or calling your MD immediately  if symptoms less severe.  You must read complete instructions/literature along with all the possible adverse reactions/side effects for all the Medicines you take and that have been prescribed to you. Take any new Medicines after you have completely understood and accpet all the possible adverse reactions/side effects.   Do not drive when taking Pain medications or sleeping medications (Benzodaizepines)  Do not take more than prescribed Pain, Sleep and Anxiety Medications. It is not advisable to combine anxiety,sleep and pain medications without talking with your primary care  practitioner  Special Instructions: If you have smoked or chewed Tobacco  in the last 2 yrs please stop smoking, stop any regular Alcohol  and or any Recreational drug use.  Wear Seat belts while driving.  Please note: You were cared for by a hospitalist during your hospital stay. Once you are discharged, your primary care physician will handle any further medical issues. Please note that NO REFILLS for any discharge medications will be authorized once you are discharged, as it is imperative that you return to  your primary care physician (or establish a relationship with a primary care physician if you do not have one) for your post hospital discharge needs so that they can reassess your need for medications and monitor your lab values.  Total Time spent coordinating discharge including counseling, education and face to face time equals greater than 30 minutes.  SignedOren Binet 12/09/2021 9:34 AM

## 2021-12-09 NOTE — TOC Transition Note (Signed)
Transition of Care [facility]) - CM/SW Discharge Note   Patient Details  Name: Keith [name] MRN: [id number] Date of Birth: 04-20-1965  [facility] CM/SW Contact:  Aleese Kamps, LCSW Phone Number: 12/09/2021, 3:17 PM   Clinical Narrative:    CSW provided taxi voucher for discharge.         Patient Goals and CMS Choice        Discharge Placement                       Discharge Plan and Services                                     Social Determinants of Health (SDOH) Interventions     Readmission Risk Interventions    10/10/2021    1:31 PM 10/21/2020   11:53 AM 08/31/2020    2:30 PM  Readmission Risk Prevention Plan  Transportation Screening Complete Complete   PCP or Specialist Appt within 3-5 Days     HRI or [place] for [place] Planning/Counseling     Palliative Care Screening     Medication Review [employer]) Complete Complete   PCP or Specialist appointment within 3-5 days of discharge Complete Complete   HRI or [place] Complete Complete   SW Recovery Care/Counseling Consult Complete Complete   Palliative Care Screening Not Applicable Not Applicable   [place] Not Applicable Complete      Information is confidential and restricted. Go to Review Flowsheets to unlock data.

## 2021-12-14 ENCOUNTER — Other Ambulatory Visit: Payer: Self-pay

## 2021-12-14 ENCOUNTER — Observation Stay (HOSPITAL_COMMUNITY)
Admission: EM | Admit: 2021-12-14 | Discharge: 2021-12-15 | Disposition: A | Discharge status: Home / Self Care | Payer: Medicare Other | Attending: Internal Medicine | Admitting: Internal Medicine

## 2021-12-14 DIAGNOSIS — E1165 Type 2 diabetes mellitus with hyperglycemia: Principal | ICD-10-CM | POA: Insufficient documentation

## 2021-12-14 DIAGNOSIS — E669 Obesity, unspecified: Secondary | ICD-10-CM | POA: Diagnosis present

## 2021-12-14 DIAGNOSIS — Z7982 Long term (current) use of aspirin: Secondary | ICD-10-CM | POA: Insufficient documentation

## 2021-12-14 DIAGNOSIS — E1100 Type 2 diabetes mellitus with hyperosmolarity without nonketotic hyperglycemic-hyperosmolar coma (NKHHC): Secondary | ICD-10-CM

## 2021-12-14 DIAGNOSIS — B20 Human immunodeficiency virus [HIV] disease: Secondary | ICD-10-CM | POA: Diagnosis not present

## 2021-12-14 DIAGNOSIS — Z89511 Acquired absence of right leg below knee: Secondary | ICD-10-CM | POA: Diagnosis not present

## 2021-12-14 DIAGNOSIS — R739 Hyperglycemia, unspecified: Secondary | ICD-10-CM | POA: Diagnosis not present

## 2021-12-14 DIAGNOSIS — Z794 Long term (current) use of insulin: Secondary | ICD-10-CM | POA: Diagnosis not present

## 2021-12-14 DIAGNOSIS — E785 Hyperlipidemia, unspecified: Secondary | ICD-10-CM | POA: Diagnosis present

## 2021-12-14 DIAGNOSIS — Z79899 Other long term (current) drug therapy: Secondary | ICD-10-CM | POA: Insufficient documentation

## 2021-12-14 DIAGNOSIS — I129 Hypertensive chronic kidney disease with stage 1 through stage 4 chronic kidney disease, or unspecified chronic kidney disease: Secondary | ICD-10-CM | POA: Diagnosis not present

## 2021-12-14 DIAGNOSIS — I2510 Atherosclerotic heart disease of native coronary artery without angina pectoris: Secondary | ICD-10-CM | POA: Insufficient documentation

## 2021-12-14 DIAGNOSIS — R531 Weakness: Secondary | ICD-10-CM | POA: Diagnosis not present

## 2021-12-14 DIAGNOSIS — Z7902 Long term (current) use of antithrombotics/antiplatelets: Secondary | ICD-10-CM | POA: Diagnosis not present

## 2021-12-14 DIAGNOSIS — N184 Chronic kidney disease, stage 4 (severe): Secondary | ICD-10-CM | POA: Insufficient documentation

## 2021-12-14 DIAGNOSIS — I10 Essential (primary) hypertension: Secondary | ICD-10-CM | POA: Diagnosis present

## 2021-12-14 DIAGNOSIS — I739 Peripheral vascular disease, unspecified: Secondary | ICD-10-CM | POA: Diagnosis present

## 2021-12-14 DIAGNOSIS — R197 Diarrhea, unspecified: Secondary | ICD-10-CM | POA: Diagnosis present

## 2021-12-14 DIAGNOSIS — Z8673 Personal history of transient ischemic attack (TIA), and cerebral infarction without residual deficits: Secondary | ICD-10-CM | POA: Diagnosis not present

## 2021-12-14 LAB — CBC
HCT: 38.4 % — ABNORMAL LOW (ref 39.0–52.0)
Hemoglobin: 11.7 g/dL — ABNORMAL LOW (ref 13.0–17.0)
MCH: 28.3 pg (ref 26.0–34.0)
MCHC: 30.5 g/dL (ref 30.0–36.0)
MCV: 92.8 fL (ref 80.0–100.0)
Platelets: 257 10*3/uL (ref 150–400)
RBC: 4.14 MIL/uL — ABNORMAL LOW (ref 4.22–5.81)
RDW: 13.8 % (ref 11.5–15.5)
WBC: 8.8 10*3/uL (ref 4.0–10.5)
nRBC: 0 % (ref 0.0–0.2)

## 2021-12-14 LAB — COMPREHENSIVE METABOLIC PANEL
ALT: 15 U/L (ref 0–44)
AST: 14 U/L — ABNORMAL LOW (ref 15–41)
Albumin: 2.9 g/dL — ABNORMAL LOW (ref 3.5–5.0)
Alkaline Phosphatase: 116 U/L (ref 38–126)
Anion gap: 14 (ref 5–15)
BUN: 47 mg/dL — ABNORMAL HIGH (ref 6–20)
CO2: 16 mmol/L — ABNORMAL LOW (ref 22–32)
Calcium: 8.7 mg/dL — ABNORMAL LOW (ref 8.9–10.3)
Chloride: 96 mmol/L — ABNORMAL LOW (ref 98–111)
Creatinine, Ser: 3.86 mg/dL — ABNORMAL HIGH (ref 0.61–1.24)
GFR, Estimated: 17 mL/min — ABNORMAL LOW (ref >60)
Glucose, Bld: 624 mg/dL (ref 70–99)
Potassium: 3.8 mmol/L (ref 3.5–5.1)
Sodium: 126 mmol/L — ABNORMAL LOW (ref 135–145)
Total Bilirubin: 0.6 mg/dL (ref 0.3–1.2)
Total Protein: 7.5 g/dL (ref 6.5–8.1)

## 2021-12-14 LAB — BASIC METABOLIC PANEL
Anion gap: 10 (ref 5–15)
BUN: 46 mg/dL — ABNORMAL HIGH (ref 6–20)
CO2: 19 mmol/L — ABNORMAL LOW (ref 22–32)
Calcium: 8.4 mg/dL — ABNORMAL LOW (ref 8.9–10.3)
Chloride: 103 mmol/L (ref 98–111)
Creatinine, Ser: 3.55 mg/dL — ABNORMAL HIGH (ref 0.61–1.24)
GFR, Estimated: 19 mL/min — ABNORMAL LOW (ref >60)
Glucose, Bld: 405 mg/dL — ABNORMAL HIGH (ref 70–99)
Potassium: 3.5 mmol/L (ref 3.5–5.1)
Sodium: 132 mmol/L — ABNORMAL LOW (ref 135–145)

## 2021-12-14 LAB — BLOOD GAS, VENOUS
Acid-base deficit: 4.3 mmol/L — ABNORMAL HIGH (ref 0.0–2.0)
Bicarbonate: 21.6 mmol/L (ref 20.0–28.0)
Drawn by: 64037
O2 Saturation: 83.6 %
Patient temperature: 36.8
pCO2, Ven: 42 mmHg — ABNORMAL LOW (ref 44–60)
pH, Ven: 7.32 (ref 7.25–7.43)
pO2, Ven: 50 mmHg — ABNORMAL HIGH (ref 32–45)

## 2021-12-14 LAB — URINALYSIS, ROUTINE W REFLEX MICROSCOPIC
Bilirubin Urine: NEGATIVE
Glucose, UA: >=500 mg/dL — AB
Ketones, ur: NEGATIVE mg/dL
Nitrite: NEGATIVE
Protein, ur: >=300 mg/dL — AB
Specific Gravity, Urine: 1.014 (ref 1.005–1.030)
WBC, UA: >50 WBC/hpf — ABNORMAL HIGH (ref 0–5)
pH: 5 (ref 5.0–8.0)

## 2021-12-14 LAB — GLUCOSE, CAPILLARY: Glucose-Capillary: 386 mg/dL — ABNORMAL HIGH (ref 70–99)

## 2021-12-14 LAB — I-STAT VENOUS BLOOD GAS, ED
Acid-base deficit: 4 mmol/L — ABNORMAL HIGH (ref 0.0–2.0)
Bicarbonate: 20.9 mmol/L (ref 20.0–28.0)
Calcium, Ion: 1.02 mmol/L — ABNORMAL LOW (ref 1.15–1.40)
HCT: 34 % — ABNORMAL LOW (ref 39.0–52.0)
Hemoglobin: 11.6 g/dL — ABNORMAL LOW (ref 13.0–17.0)
O2 Saturation: 99 %
Potassium: 4.1 mmol/L (ref 3.5–5.1)
Sodium: 126 mmol/L — ABNORMAL LOW (ref 135–145)
TCO2: 22 mmol/L (ref 22–32)
pCO2, Ven: 37.3 mmHg — ABNORMAL LOW (ref 44–60)
pH, Ven: 7.356 (ref 7.25–7.43)
pO2, Ven: 133 mmHg — ABNORMAL HIGH (ref 32–45)

## 2021-12-14 LAB — OSMOLALITY: Osmolality: 311 mOsm/kg — ABNORMAL HIGH (ref 275–295)

## 2021-12-14 LAB — CBG MONITORING, ED
Glucose-Capillary: 557 mg/dL (ref 70–99)
Glucose-Capillary: 577 mg/dL (ref 70–99)
Glucose-Capillary: 472 mg/dL — ABNORMAL HIGH (ref 70–99)

## 2021-12-14 LAB — BETA-HYDROXYBUTYRIC ACID: Beta-Hydroxybutyric Acid: 0.26 mmol/L (ref 0.05–0.27)

## 2021-12-14 LAB — LIPASE, BLOOD: Lipase: 38 U/L (ref 11–51)

## 2021-12-14 MED ORDER — INSULIN REGULAR BOLUS VIA INFUSION: 10.0000 [IU] | Freq: Once | INTRAVENOUS | Status: DC

## 2021-12-14 MED ORDER — ESCITALOPRAM OXALATE 20 MG PO TABS
20.0000 mg | ORAL_TABLET | Freq: Every day | ORAL | Status: DC
  Administered 2021-12-14 – 2021-12-15 (×2): 20 mg via ORAL
  Filled 2021-12-14: qty 1
  Filled 2021-12-14: qty 2

## 2021-12-14 MED ORDER — INSULIN ASPART 100 UNIT/ML IJ SOLN
0.0000 [IU] | Freq: Every day | INTRAMUSCULAR | Status: DC
  Administered 2021-12-14: 5 [IU] via SUBCUTANEOUS

## 2021-12-14 MED ORDER — DEXTROSE 50 % IV SOLN: 0.0000 mL | INTRAVENOUS | Status: DC | PRN

## 2021-12-14 MED ORDER — SENNA 8.6 MG PO TABS: 2.0000 | ORAL_TABLET | Freq: Every day | ORAL | Status: DC

## 2021-12-14 MED ORDER — DOLUTEGRAVIR-LAMIVUDINE 50-300 MG PO TABS: 1.0000 | ORAL_TABLET | Freq: Every day | ORAL | Status: DC

## 2021-12-14 MED ORDER — INSULIN ASPART 100 UNIT/ML IV SOLN
10.0000 [IU] | Freq: Once | INTRAVENOUS | Status: AC
  Administered 2021-12-14: 10 [IU] via INTRAVENOUS

## 2021-12-14 MED ORDER — GABAPENTIN 100 MG PO CAPS
200.0000 mg | ORAL_CAPSULE | Freq: Two times a day (BID) | ORAL | Status: DC
  Administered 2021-12-14 – 2021-12-15 (×2): 200 mg via ORAL
  Filled 2021-12-14 (×2): qty 2

## 2021-12-14 MED ORDER — HYDRALAZINE HCL 10 MG PO TABS
10.0000 mg | ORAL_TABLET | Freq: Every day | ORAL | Status: DC
  Administered 2021-12-14 – 2021-12-15 (×2): 10 mg via ORAL
  Filled 2021-12-14 (×3): qty 1

## 2021-12-14 MED ORDER — PANTOPRAZOLE SODIUM 40 MG PO TBEC
40.0000 mg | DELAYED_RELEASE_TABLET | Freq: Every day | ORAL | Status: DC
  Administered 2021-12-14 – 2021-12-15 (×2): 40 mg via ORAL
  Filled 2021-12-14 (×2): qty 1

## 2021-12-14 MED ORDER — FINASTERIDE 5 MG PO TABS
5.0000 mg | ORAL_TABLET | Freq: Every day | ORAL | Status: DC
  Administered 2021-12-14 – 2021-12-15 (×2): 5 mg via ORAL
  Filled 2021-12-14 (×2): qty 1

## 2021-12-14 MED ORDER — POTASSIUM CHLORIDE 10 MEQ/100ML IV SOLN
10.0000 meq | INTRAVENOUS | Status: AC
  Administered 2021-12-14: 10 meq via INTRAVENOUS
  Filled 2021-12-14: qty 100

## 2021-12-14 MED ORDER — CLOPIDOGREL BISULFATE 75 MG PO TABS
75.0000 mg | ORAL_TABLET | Freq: Every day | ORAL | Status: DC
  Administered 2021-12-14 – 2021-12-15 (×2): 75 mg via ORAL
  Filled 2021-12-14 (×2): qty 1

## 2021-12-14 MED ORDER — CYCLOBENZAPRINE HCL 10 MG PO TABS
10.0000 mg | ORAL_TABLET | Freq: Every day | ORAL | Status: DC | PRN
Start: 2021-12-14 — End: 2021-12-15

## 2021-12-14 MED ORDER — ASPIRIN 81 MG PO TBEC
81.0000 mg | DELAYED_RELEASE_TABLET | Freq: Every day | ORAL | Status: DC
  Administered 2021-12-14 – 2021-12-15 (×2): 81 mg via ORAL
  Filled 2021-12-14 (×2): qty 1

## 2021-12-14 MED ORDER — HEPARIN SODIUM (PORCINE) 5000 UNIT/ML IJ SOLN
5000.0000 [IU] | Freq: Three times a day (TID) | INTRAMUSCULAR | Status: DC
  Administered 2021-12-14 – 2021-12-15 (×3): 5000 [IU] via SUBCUTANEOUS
  Filled 2021-12-14 (×3): qty 1

## 2021-12-14 MED ORDER — INSULIN REGULAR(HUMAN) IN NACL 100-0.9 UT/100ML-% IV SOLN: INTRAVENOUS | Status: DC

## 2021-12-14 MED ORDER — FERROUS SULFATE 325 (65 FE) MG PO TABS
325.0000 mg | ORAL_TABLET | ORAL | Status: DC
  Administered 2021-12-14: 325 mg via ORAL
  Filled 2021-12-14: qty 1

## 2021-12-14 MED ORDER — LOPERAMIDE HCL 2 MG PO CAPS: 2.0000 mg | ORAL_CAPSULE | Freq: Four times a day (QID) | ORAL | Status: DC | PRN

## 2021-12-14 MED ORDER — DOLUTEGRAVIR-LAMIVUDINE 50-300 MG PO TABS
1.0000 | ORAL_TABLET | Freq: Every day | ORAL | Status: DC
  Administered 2021-12-14 – 2021-12-15 (×2): 1 via ORAL
  Filled 2021-12-14 (×3): qty 1

## 2021-12-14 MED ORDER — DEXTROSE IN LACTATED RINGERS 5 % IV SOLN: INTRAVENOUS | Status: DC

## 2021-12-14 MED ORDER — INSULIN GLARGINE-YFGN 100 UNIT/ML ~~LOC~~ SOLN
15.0000 [IU] | Freq: Every day | SUBCUTANEOUS | Status: DC
  Administered 2021-12-14: 15 [IU] via SUBCUTANEOUS
  Filled 2021-12-14 (×2): qty 0.15

## 2021-12-14 MED ORDER — LACTATED RINGERS IV SOLN: INTRAVENOUS | Status: DC

## 2021-12-14 MED ORDER — ICOSAPENT ETHYL 1 G PO CAPS
2.0000 g | ORAL_CAPSULE | Freq: Two times a day (BID) | ORAL | Status: DC
  Administered 2021-12-14 – 2021-12-15 (×2): 2 g via ORAL
  Filled 2021-12-14 (×3): qty 2

## 2021-12-14 MED ORDER — DORAVIRINE 100 MG PO TABS
100.0000 mg | ORAL_TABLET | Freq: Every day | ORAL | Status: DC
  Administered 2021-12-14 – 2021-12-15 (×2): 100 mg via ORAL
  Filled 2021-12-14 (×3): qty 1

## 2021-12-14 MED ORDER — SODIUM CHLORIDE 0.9 % IV BOLUS
1000.0000 mL | INTRAVENOUS | Status: AC
  Administered 2021-12-14 (×2): 1000 mL via INTRAVENOUS

## 2021-12-14 MED ORDER — ALLOPURINOL 100 MG PO TABS: 100.0000 mg | ORAL_TABLET | Freq: Every day | ORAL | Status: DC

## 2021-12-14 MED ORDER — ATORVASTATIN CALCIUM 80 MG PO TABS
80.0000 mg | ORAL_TABLET | Freq: Every day | ORAL | Status: DC
  Administered 2021-12-14 – 2021-12-15 (×2): 80 mg via ORAL
  Filled 2021-12-14: qty 2
  Filled 2021-12-14: qty 1

## 2021-12-14 MED ORDER — SODIUM CHLORIDE 0.9 % IV SOLN
1.0000 g | Freq: Once | INTRAVENOUS | Status: AC
  Administered 2021-12-14: 1 g via INTRAVENOUS
  Filled 2021-12-14: qty 10

## 2021-12-14 MED ORDER — CARVEDILOL 3.125 MG PO TABS
3.1250 mg | ORAL_TABLET | Freq: Two times a day (BID) | ORAL | Status: DC
  Administered 2021-12-14 – 2021-12-15 (×2): 3.125 mg via ORAL
  Filled 2021-12-14 (×2): qty 1

## 2021-12-14 MED ORDER — EZETIMIBE 10 MG PO TABS
10.0000 mg | ORAL_TABLET | Freq: Every day | ORAL | Status: DC
  Administered 2021-12-14 – 2021-12-15 (×2): 10 mg via ORAL
  Filled 2021-12-14 (×2): qty 1

## 2021-12-14 MED ORDER — SODIUM BICARBONATE 650 MG PO TABS
650.0000 mg | ORAL_TABLET | Freq: Two times a day (BID) | ORAL | Status: DC
  Administered 2021-12-14: 650 mg via ORAL
  Filled 2021-12-14: qty 1

## 2021-12-14 MED ORDER — LACTATED RINGERS IV BOLUS
20.0000 mL/kg | Freq: Once | INTRAVENOUS | Status: AC
  Administered 2021-12-14: 2358 mL via INTRAVENOUS

## 2021-12-14 MED ORDER — INSULIN ASPART 100 UNIT/ML IJ SOLN
4.0000 [IU] | Freq: Three times a day (TID) | INTRAMUSCULAR | Status: DC
  Administered 2021-12-14 – 2021-12-15 (×3): 4 [IU] via SUBCUTANEOUS

## 2021-12-14 MED ORDER — INSULIN ASPART 100 UNIT/ML IJ SOLN
0.0000 [IU] | Freq: Three times a day (TID) | INTRAMUSCULAR | Status: DC
  Administered 2021-12-15 (×2): 5 [IU] via SUBCUTANEOUS

## 2021-12-14 MED ORDER — OXYCODONE HCL 5 MG PO TABS: 10.0000 mg | ORAL_TABLET | ORAL | Status: DC | PRN

## 2021-12-14 MED ORDER — DAPSONE 25 MG PO TABS
50.0000 mg | ORAL_TABLET | Freq: Two times a day (BID) | ORAL | Status: DC
  Administered 2021-12-14 – 2021-12-15 (×2): 50 mg via ORAL
  Filled 2021-12-14 (×4): qty 2

## 2021-12-14 MED ORDER — SODIUM CHLORIDE 0.9 % IV SOLN: INTRAVENOUS | Status: DC

## 2021-12-14 NOTE — ED Notes (Signed)
Glucose 624

## 2021-12-14 NOTE — ED Provider Notes (Signed)
ED Course / MDM    Medical Decision Making Amount and/or Complexity of Data Reviewed Labs: ordered.  Risk Prescription drug management.  Accepted handoff at shift change from [facility]. Please see prior provider note for full HPI.  Briefly: Patient is a 56 year old male with history of stroke, STEMI, uncontrolled T2DM, CKD stage 4, HIV/AIDS, and HTN who presents to the ER for diarrhea x 2 weeks. Recently seen for similar, once admitted for hyperglycemia.  DDX/Plan: Await laboratory evaluation. Concern for DKA, likely admit for such. Labs reviewed with results below. Interpreted by myself with the following pertinent findings: normal pH, CO2 16, CBG initially 624. Concern for developing DKA. Will start fluids and insulin per endotool.   I consulted with hospitalist Dr. Roosevelt Locks who will admit.   I discussed this case with my attending physician Dr. Tyrone Nine who cosigned this note including patient's presenting symptoms, physical exam, and planned diagnostics and interventions. Attending physician stated agreement with plan or made changes to plan which were implemented.   Results for orders placed or performed during the hospital encounter of 12/14/21  Lipase, blood  Result Value Ref Range   Lipase 38 11 - 51 U/L  Comprehensive metabolic panel  Result Value Ref Range   Sodium 126 (L) 135 - 145 mmol/L   Potassium 3.8 3.5 - 5.1 mmol/L   Chloride 96 (L) 98 - 111 mmol/L   CO2 16 (L) 22 - 32 mmol/L   Glucose, Bld 624 (HH) 70 - 99 mg/dL   BUN 47 (H) 6 - 20 mg/dL   Creatinine, Ser 3.86 (H) 0.61 - 1.24 mg/dL   Calcium 8.7 (L) 8.9 - 10.3 mg/dL   Total Protein 7.5 6.5 - 8.1 g/dL   Albumin 2.9 (L) 3.5 - 5.0 g/dL   AST 14 (L) 15 - 41 U/L   ALT 15 0 - 44 U/L   Alkaline Phosphatase 116 38 - 126 U/L   Total Bilirubin 0.6 0.3 - 1.2 mg/dL   GFR, Estimated 17 (L) >60 mL/min   Anion gap 14 5 - 15  CBC  Result Value Ref Range   WBC 8.8 4.0 - 10.5 K/uL   RBC 4.14 (L) 4.22 - 5.81 MIL/uL    Hemoglobin 11.7 (L) 13.0 - 17.0 g/dL   HCT 38.4 (L) 39.0 - 52.0 %   MCV 92.8 80.0 - 100.0 fL   MCH 28.3 26.0 - 34.0 pg   MCHC 30.5 30.0 - 36.0 g/dL   RDW 13.8 11.5 - 15.5 %   Platelets 257 150 - 400 K/uL   nRBC 0.0 0.0 - 0.2 %  Urinalysis, Routine w reflex microscopic Urine, Clean Catch  Result Value Ref Range   Color, Urine YELLOW YELLOW   APPearance CLOUDY (A) CLEAR   Specific Gravity, Urine 1.014 1.005 - 1.030   pH 5.0 5.0 - 8.0   Glucose, UA >=500 (A) NEGATIVE mg/dL   Hgb urine dipstick SMALL (A) NEGATIVE   Bilirubin Urine NEGATIVE NEGATIVE   Ketones, ur NEGATIVE NEGATIVE mg/dL   Protein, ur >=300 (A) NEGATIVE mg/dL   Nitrite NEGATIVE NEGATIVE   Leukocytes,Ua LARGE (A) NEGATIVE   RBC / HPF 0-5 0 - 5 RBC/hpf   WBC, UA >50 (H) 0 - 5 WBC/hpf   Bacteria, UA MANY (A) NONE SEEN   Squamous Epithelial / LPF 0-5 0 - 5   WBC Clumps PRESENT    Amorphous Crystal PRESENT   Beta-hydroxybutyric acid  Result Value Ref Range   Beta-Hydroxybutyric Acid 0.26  0.05 - 0.27 mmol/L  I-Stat venous blood gas, ED  Result Value Ref Range   pH, Ven 7.356 7.25 - 7.43   pCO2, Ven 37.3 (L) 44 - 60 mmHg   pO2, Ven 133 (H) 32 - 45 mmHg   Bicarbonate 20.9 20.0 - 28.0 mmol/L   TCO2 22 22 - 32 mmol/L   O2 Saturation 99 %   Acid-base deficit 4.0 (H) 0.0 - 2.0 mmol/L   Sodium 126 (L) 135 - 145 mmol/L   Potassium 4.1 3.5 - 5.1 mmol/L   Calcium, Ion 1.02 (L) 1.15 - 1.40 mmol/L   HCT 34.0 (L) 39.0 - 52.0 %   Hemoglobin 11.6 (L) 13.0 - 17.0 g/dL   Sample type VENOUS   CBG monitoring, ED  Result Value Ref Range   Glucose-Capillary 557 (HH) 70 - 99 mg/dL   Comment 1 Notify RN    *Note: Due to a large number of results and/or encounters for the requested time period, some results have not been displayed. A complete set of results can be found in Results Review.   DG Abd 1 View  Result Date: 12/07/2021 CLINICAL DATA:  A 56 year old male presents with nausea and vomiting. EXAM: ABDOMEN - 1 VIEW COMPARISON:   December of 2017. FINDINGS: Incidental imaging of the lung bases is unremarkable. Moderate stool and gas in the rectum. Scattered gas-filled loops of colon also with mild colonic stool in the area of the ascending colon. Due to patient body habitus a portion of the RIGHT flank is excluded. There is scattered gas-filled loops of small bowel as well but without substantial distension. On limited assessment there is no acute skeletal process IMPRESSION: Query mild constipation. Scattered gas-filled loops of small bowel may reflect mild ileus. No signs of obstruction. RIGHT flank partially excluded from view on today's evaluation. Correlate with any localizing symptoms in this area or signs of hernia. Could consider repeat imaging as warranted. Electronically Signed   By: Zetta Bills M.D.   On: 12/07/2021 09:52      Laurelle Skiver T, PA-C 12/14/21 [place], DO 12/14/21 1645

## 2021-12-14 NOTE — ED Triage Notes (Signed)
Pt. Stated, ive had diarrhea for 2 weeks. Denies any other symptoms.

## 2021-12-14 NOTE — ED Notes (Signed)
Patient denies pain and is resting comfortably.   NAD at this time.

## 2021-12-14 NOTE — ED Provider Notes (Signed)
[facility] EMERGENCY DEPARTMENT Provider Note   CSN: [id number] Arrival date & time: 12/14/21  0751     History  Chief Complaint  Patient presents with   Diarrhea    Keith [name] is a 56 y.o. male.  With past medical history significant for stroke, STEMI, uncontrolled type 2 diabetes, CKD stage IV, AIDS, hypertension who presents to the emergency department with diarrhea.  Patient states that he has had diarrhea over the past 2 weeks.  States that he has watery diarrhea "almost every time he urinates."  He denies melena or hematochezia.  He denies any abdominal pain, nausea or vomiting.  Denies fevers or recent antibiotic use.  He has been seen twice before for the same symptom.  States that he was initially seen on 11/20/2021.  He was given Imodium and discharged.  He was then seen on 12/05/2021 and was found to be hyperglycemic and at that time still complaining of ongoing diarrhea.  Based on discharge note appears they thought diarrhea is based on possible previous constipation.  He also presents hyperglycemic.  States that he has been taking his NovoLog but is not taking sliding scale or glargine.   Diarrhea Associated symptoms: no abdominal pain, no fever and no vomiting        Home Medications Prior to Admission medications   Medication Sig Start Date End Date Taking? Authorizing Provider  allopurinol (ZYLOPRIM) 100 MG tablet Take 1 tablet (100 mg total) by mouth daily. 12/26/20 12/06/21  [name], MD  aspirin EC 81 MG tablet Take 81 mg by mouth daily. Swallow whole.    [provider]  atorvastatin (LIPITOR) 80 MG tablet Take 1 tablet (80 mg total) by mouth daily. 09/01/20 01/02/22  [name], MD  carvedilol (COREG) 3.125 MG tablet Take 3.125 mg by mouth 2 (two) times daily with a meal.    [provider]  clopidogrel (PLAVIX) 75 MG tablet Take 1 tablet (75 mg total) by mouth daily. 11/15/21   Jettie Booze, MD   cyclobenzaprine (FLEXERIL) 10 MG tablet Take 10 mg by mouth daily as needed for muscle spasms. 09/27/21   [provider]  dapsone 100 MG tablet TAKE 1 TABLET (100 MG TOTAL) BY MOUTH DAILY. Patient not taking: Reported on 12/06/2021 02/13/21 02/13/22  Golden Circle, FNP  dolutegravir-lamiVUDine (DOVATO) 50-300 MG tablet Take 1 tablet by mouth daily. 02/13/21   Golden Circle, FNP  doravirine (PIFELTRO) 100 MG TABS tablet Take 1 tablet (100 mg total) by mouth daily. 02/13/21   Golden Circle, FNP  escitalopram (LEXAPRO) 20 MG tablet Take 1 tablet (20 mg total) by mouth daily. 10/19/21   Hosie Poisson, MD  ezetimibe (ZETIA) 10 MG tablet Take 1 tablet (10 mg total) by mouth daily. 11/15/21   Jettie Booze, MD  ferrous sulfate 325 (65 FE) MG tablet Take 1 tablet (325 mg total) by mouth every Monday, Wednesday, and Friday at 6 PM. Do not take with other medicines. 10/19/21 11/18/21  Hosie Poisson, MD  finasteride (PROSCAR) 5 MG tablet Take 5 mg by mouth daily. 08/20/21   [provider]  gabapentin (NEURONTIN) 100 MG capsule Take 1 capsule (100 mg total) by mouth 3 (three) times daily. Patient taking differently: Take 200 mg by mouth 2 (two) times daily. 10/19/21 12/18/21  Hosie Poisson, MD  hydrALAZINE (APRESOLINE) 10 MG tablet Take 10 mg by mouth daily.    [provider]  icosapent Ethyl (VASCEPA) 1 g  capsule TAKE 2 CAPSULES (2 G TOTAL) BY MOUTH TWO TIMES DAILY. Patient taking differently: Take 2 g by mouth 2 (two) times daily. 03/27/20 12/06/21  [name], MD  insulin aspart (NOVOLOG) 100 UNIT/ML injection CBG 70 - 120: 0 units  CBG 121 - 150: 2 units  CBG 151 - 200: 3 units  CBG 201 - 250: 5 units  CBG 251 - 300: 8 units  CBG 301 - 350: 11 units  CBG 351 - 400: 15 units Patient taking differently: Inject 0-15 Units into the skin 3 (three) times daily with meals. CBG 70 - 120: 0 units  CBG 121 - 150: 2 units  CBG 151 - 200: 3 units  CBG 201 - 250: 5  units  CBG 251 - 300: 8 units  CBG 301 - 350: 11 units  CBG 351 - 400: 15 units 10/19/21   Hosie Poisson, MD  insulin aspart (NOVOLOG) 100 UNIT/ML injection Inject 4 Units into the skin 3 (three) times daily with meals. 12/09/21   Ghimire, Henreitta Leber, MD  insulin glargine-yfgn (SEMGLEE) 100 UNIT/ML injection Inject 0.28 mLs (28 Units total) into the skin daily. 12/09/21   Ghimire, Henreitta Leber, MD  Lancets [facility] ULTRASOFT) lancets Use as instructed 02/16/20   [name], FNP  loperamide (IMODIUM) 2 MG capsule Take 1 capsule (2 mg total) by mouth 4 (four) times daily as needed for diarrhea or loose stools. 11/21/21   Redwine, Madison A, PA-C  mupirocin cream (BACTROBAN) 2 % Apply topically daily. Patient not taking: Reported on 12/06/2021 10/20/21   Hosie Poisson, MD  nitroGLYCERIN (NITROSTAT) 0.4 MG SL tablet Place 0.4 mg under the tongue every 5 (five) minutes as needed for chest pain.    [provider]  Oxycodone HCl 10 MG TABS Take 10 mg by mouth every 4 (four) hours as needed (For pain). 10/31/21   [provider]  pantoprazole (PROTONIX) 40 MG tablet Take 1 tablet (40 mg total) by mouth daily. 10/19/21   Hosie Poisson, MD  senna (SENOKOT) 8.6 MG TABS tablet Take 2 tablets (17.2 mg total) by mouth at bedtime. 10/19/21   Hosie Poisson, MD  sodium bicarbonate 650 MG tablet Take 1 tablet (650 mg total) by mouth 2 (two) times daily. 10/19/21   Hosie Poisson, MD      Allergies    Elemental sulfur and Sulfa antibiotics    Review of Systems   Review of Systems  Constitutional:  Negative for fever.  Gastrointestinal:  Positive for diarrhea. Negative for abdominal pain, nausea and vomiting.  Endocrine: Positive for polyuria.  Genitourinary:  Negative for dysuria.  All other systems reviewed and are negative.   Physical Exam Updated Vital Signs BP (!) 165/128 (BP Location: Right Arm)   Pulse 80   Temp 98 F (36.7 C) (Oral)   Resp 17   Ht 5\' 10"  (1.778 m)   Wt 117.9 kg    SpO2 100%   BMI 37.31 kg/m  Physical Exam Vitals and nursing note reviewed.  Constitutional:      General: He is not in acute distress.    Appearance: He is obese. He is ill-appearing.  HENT:     Head: Normocephalic.  Eyes:     General: No scleral icterus.    Extraocular Movements: Extraocular movements intact.     Pupils: Pupils are equal, round, and reactive to light.  Cardiovascular:     Rate and Rhythm: Normal rate and regular rhythm.     Pulses:  Radial pulses are 2+ on the right side and 2+ on the left side.     Heart sounds: Normal heart sounds.  Pulmonary:     Effort: Pulmonary effort is normal.     Breath sounds: Normal breath sounds.  Abdominal:     General: Abdomen is protuberant. Bowel sounds are normal. There is no distension.     Palpations: Abdomen is soft.     Tenderness: There is no abdominal tenderness.  Musculoskeletal:     Right Lower Extremity: Right leg is amputated below knee.     Left Lower Extremity: Left leg is amputated above knee.  Skin:    General: Skin is warm and dry.     Capillary Refill: Capillary refill takes less than 2 seconds.  Neurological:     General: No focal deficit present.     Mental Status: He is alert.  Psychiatric:        Mood and Affect: Mood normal.        Behavior: Behavior normal.    ED Results / Procedures / Treatments   Labs (all labs ordered are listed, but only abnormal results are displayed) Labs Reviewed  COMPREHENSIVE METABOLIC PANEL - Abnormal; Notable for the following components:      Result Value   Sodium 126 (*)    Chloride 96 (*)    CO2 16 (*)    Glucose, Bld 624 (*)    BUN 47 (*)    Creatinine, Ser 3.86 (*)    Calcium 8.7 (*)    Albumin 2.9 (*)    AST 14 (*)    GFR, Estimated 17 (*)    All other components within normal limits  CBC - Abnormal; Notable for the following components:   RBC 4.14 (*)    Hemoglobin 11.7 (*)    HCT 38.4 (*)    All other components within normal limits   URINALYSIS, ROUTINE W REFLEX MICROSCOPIC - Abnormal; Notable for the following components:   APPearance CLOUDY (*)    Glucose, UA >=500 (*)    Hgb urine dipstick SMALL (*)    Protein, ur >=300 (*)    Leukocytes,Ua LARGE (*)    WBC, UA >50 (*)    Bacteria, UA MANY (*)    All other components within normal limits  LIPASE, BLOOD   EKG None  Radiology No results found.  Procedures Procedures   Medications Ordered in ED Medications - No data to display  ED Course/ Medical Decision Making/ A&P                           Medical Decision Making Amount and/or Complexity of Data Reviewed Labs: ordered.  Risk Prescription drug management.  Care of patient handed off to [employer], PA-C at this time. Patient pending completed labs. He will likely require admission for hyperglycemia vs. DKA as well as continued work up of diarrhea.I have ordered C-diff but given HIV history may need more in depth work up as this has been 2 weeks without improvement and failed OP therapy.  Final Clinical Impression(s) / ED Diagnoses Final diagnoses:  None    Rx / DC Orders ED Discharge Orders     None         Mickie Hillier, PA-C 12/14/21 1509    Tegeler, Gwenyth Allegra, MD 12/14/21 1525

## 2021-12-14 NOTE — H&P (Signed)
History and Physical    Keith [name] YQI:[id number] DOB: 04-21-1965 DOA: 12/14/2021  [name], NP (Confirm with patient/family/NH records and if not entered, this has to be entered at [facility] point of entry) Patient coming from: Home  I have personally briefly reviewed patient's old medical records in [place]  Chief Complaint: Nauseous vomiting diarrhea and generalized weakness  HPI: Keith [name] is a 56 y.o. male with medical history significant of IDDM controlled with most recent A1c 11.5, HIV on HAART, CAD, PVD status post bilateral BKA, CKD stage IV, presented with worsening of nauseous vomiting diarrhea and generalized weakness.  Patient reported on and off watery diarrhea for last 2 weeks, 1-2 times a day, copious amount, associated with cramping like abdominal pain also admitted tenesmus but no fever or chills.  He also has had occasional nauseous and vomiting of stomach content nonbloody nonbilious.  Last few days, he started to feel very weak and he stopped taking his insulin 2 days ago.  Today, he woke up feeling extremely weak and could not get out of bed.  Denies any fever chills no cough no urinary symptoms.  He reported compliance with HIV medications. ED Course: Afebrile, no tachycardia nonhypotensive nonhypoxic.  Glucose> 600, come down to 500 after 1 L of IV bolus.  Creatinine 3.8 is about his baseline, K3.8, bicarb 16.  Review of Systems: As per HPI otherwise 14 point review of systems negative.    Past Medical History:  Diagnosis Date   Abscess of right foot    abscess/ulcer of R transtibial amputation requiring IV abx   Acute ST elevation myocardial infarction (STEMI) due to occlusion of circumflex coronary artery ([place]) 03/22/2020   AIDS ([place])    Anemia    CAD (coronary artery disease)    a. MI with stenting of OM1 in 11/2018 with residual disease. b. acute STEMI 03/2020 s/p DES to OM1   Chronic anemia    Chronic knee pain    right   Chronic pain     CKD (chronic kidney disease), stage IV (HCC)    CVA (cerebral vascular accident) ([place]) 04/2020   Diabetes type 2, uncontrolled    HgA1c 17.6 (04/27/2010)   Diabetic foot ulcer ([place]) 01/2017   right foot   Dilatation of aorta (HCC)    Erectile dysfunction    Genital warts    GERD (gastroesophageal reflux disease)    History of blood transfusion    HIV (human immunodeficiency virus infection) ([place]) 2009   CD4 count 100, VL 13800 (05/01/2010)   Hyperlipidemia    Hypertension    Myocardial infarction [facility])    Neuropathy    Noncompliance with medication regimen    Osteomyelitis ([place])    h/o hand   Osteomyelitis of metatarsal ([place]) 04/28/2017   Pneumonia    STEMI (ST elevation myocardial infarction) ([place]) 11/10/2018    Past Surgical History:  Procedure Laterality Date   AMPUTATION Right 05/02/2017   Procedure: AMPUTATION TRANSMETARSAL;  Surgeon: [name], MD;  Location: [place];  Service: Orthopedics;  Laterality: Right;   AMPUTATION Right 06/13/2017   Procedure: RIGHT BELOW KNEE AMPUTATION;  Surgeon: [name], MD;  Location: [place];  Service: Orthopedics;  Laterality: Right;   AMPUTATION Left 10/27/2020   Procedure: LEFT BELOW KNEE AMPUTATION;  Surgeon: [name], MD;  Location: [place];  Service: Orthopedics;  Laterality: Left;   AMPUTATION Left 11/17/2020   Procedure: REVISION AMPUTATION BELOW KNEE, LEFT;  Surgeon:  [name], MD;  Location: [place];  Service: Orthopedics;  Laterality: Left;   AMPUTATION Left 01/03/2021   Procedure: LEFT ABOVE KNEE AMPUTATION;  Surgeon: [name], MD;  Location: [place];  Service: Orthopedics;  Laterality: Left;   APPLICATION OF WOUND VAC Left 10/27/2020   Procedure: APPLICATION OF WOUND VAC;  Surgeon: [name], MD;  Location: [place];  Service: Orthopedics;  Laterality: Left;   BELOW KNEE LEG AMPUTATION Right 06/13/2017   BELOW KNEE LEG AMPUTATION     CORONARY STENT INTERVENTION N/A 11/10/2018   Procedure: CORONARY STENT  INTERVENTION;  Surgeon: Leonie Man, MD;  Location: [place] CV LAB;  Service: Cardiovascular;  Laterality: N/A;   CORONARY/GRAFT ACUTE MI REVASCULARIZATION N/A 03/21/2020   Procedure: Coronary/Graft Acute MI Revascularization;  Surgeon: [name], MD;  Location: [place] CV LAB;  Service: Cardiovascular;  Laterality: N/A;   CYSTOSCOPY N/A 11/09/2020   Procedure: CYSTOSCOPY, EVACUATION OF CLOTS, FULGERATION OF BLADDER AND BILATERIAL RETROGRADE PYELOGRAMS;  Surgeon: Janith Lima, MD;  Location: [place];  Service: Urology;  Laterality: N/A;   HERNIA REPAIR     I & D EXTREMITY Left 08/21/2014   Procedure: INCISION AND DRAINAGE LEFT SMALL FINGER;  Surgeon: Leanora Cover, MD;  Location: [place];  Service: Orthopedics;  Laterality: Left;   I & D EXTREMITY Right 03/18/2017   Procedure: IRRIGATION AND DEBRIDEMENT EXTREMITY;  Surgeon: [name], MD;  Location: [place];  Service: Orthopedics;  Laterality: Right;   IR FLUORO GUIDE CV LINE RIGHT  11/02/2020   IR REMOVAL TUN CV CATH W/O FL  11/13/2020   IR US GUIDE VASC ACCESS RIGHT  11/02/2020   LEFT HEART CATH AND CORONARY ANGIOGRAPHY N/A 11/10/2018   Procedure: LEFT HEART CATH AND CORONARY ANGIOGRAPHY;  Surgeon: Leonie Man, MD;  Location: [place] CV LAB;  Service: Cardiovascular;  Laterality: N/A;   LEFT HEART CATH AND CORONARY ANGIOGRAPHY N/A 03/21/2020   Procedure: LEFT HEART CATH AND CORONARY ANGIOGRAPHY;  Surgeon: [name], MD;  Location: [place] CV LAB;  Service: Cardiovascular;  Laterality: N/A;   MINOR IRRIGATION AND DEBRIDEMENT OF WOUND Right 04/22/2014   Procedure: IRRIGATION AND DEBRIDEMENT OF RIGHT NECK ABCESS;  Surgeon: Jerrell Belfast, MD;  Location: [place];  Service: ENT;  Laterality: Right;   MULTIPLE EXTRACTIONS WITH ALVEOLOPLASTY N/A 01/18/2013   Procedure: MULTIPLE EXTRACION 3, 6, 7, 10, 11, 13, 21, 22, 27, 28, 29, 30 WITH ALVEOLOPLASTY;  Surgeon: Gae Bon, DDS;  Location: [place];  Service: Oral Surgery;   Laterality: N/A;   SKIN SPLIT GRAFT Right 03/21/2017   Procedure: IRRIGATION AND DEBRIDEMENT RIGHT FOOT AND APPLY SPLIT THICKNESS SKIN GRAFT AND WOUND VAC;  Surgeon: [name], MD;  Location: [place];  Service: Orthopedics;  Laterality: Right;   STUMP REVISION Left 12/02/2020   Procedure: REVISION LEFT BELOW KNEE AMPUTATION;  Surgeon: [name], MD;  Location: [place];  Service: Orthopedics;  Laterality: Left;   TEE WITHOUT CARDIOVERSION N/A 05/02/2017   Procedure: TRANSESOPHAGEAL ECHOCARDIOGRAM (TEE);  Surgeon: Acie Fredrickson Wonda Cheng, MD;  Location: [facility] OR;  Service: Cardiovascular;  Laterality: N/A;  coincidental to orthopedic case     reports that he has never smoked. He has never used smokeless tobacco. He reports that he does not currently use alcohol. He reports that he does not use drugs.  Allergies  Allergen Reactions   Elemental Sulfur Itching    Patient stated he's allergic to "sulfur" AND "  sulfa"   Sulfa Antibiotics Itching    Family History  Problem Relation Age of Onset   Hypertension Mother    Arthritis Father    Hypertension Father    Hypertension Brother    Cancer Maternal Grandmother 67       unknown type of cancer   Depression Paternal Grandmother      Prior to Admission medications   Medication Sig Start Date End Date Taking? Authorizing Provider  allopurinol (ZYLOPRIM) 100 MG tablet Take 1 tablet (100 mg total) by mouth daily. 12/26/20 12/06/21  [name], MD  aspirin EC 81 MG tablet Take 81 mg by mouth daily. Swallow whole.    [provider]  atorvastatin (LIPITOR) 80 MG tablet Take 1 tablet (80 mg total) by mouth daily. 09/01/20 01/02/22  [name], MD  carvedilol (COREG) 3.125 MG tablet Take 3.125 mg by mouth 2 (two) times daily with a meal.    [provider]  clopidogrel (PLAVIX) 75 MG tablet Take 1 tablet (75 mg total) by mouth daily. 11/15/21   Jettie Booze, MD  cyclobenzaprine (FLEXERIL) 10 MG tablet Take 10 mg by mouth  daily as needed for muscle spasms. 09/27/21   [provider]  dapsone 100 MG tablet TAKE 1 TABLET (100 MG TOTAL) BY MOUTH DAILY. Patient not taking: Reported on 12/06/2021 02/13/21 02/13/22  Golden Circle, FNP  dolutegravir-lamiVUDine (DOVATO) 50-300 MG tablet Take 1 tablet by mouth daily. 02/13/21   Golden Circle, FNP  doravirine (PIFELTRO) 100 MG TABS tablet Take 1 tablet (100 mg total) by mouth daily. 02/13/21   Golden Circle, FNP  escitalopram (LEXAPRO) 20 MG tablet Take 1 tablet (20 mg total) by mouth daily. 10/19/21   Hosie Poisson, MD  ezetimibe (ZETIA) 10 MG tablet Take 1 tablet (10 mg total) by mouth daily. 11/15/21   Jettie Booze, MD  ferrous sulfate 325 (65 FE) MG tablet Take 1 tablet (325 mg total) by mouth every Monday, Wednesday, and Friday at 6 PM. Do not take with other medicines. 10/19/21 11/18/21  Hosie Poisson, MD  finasteride (PROSCAR) 5 MG tablet Take 5 mg by mouth daily. 08/20/21   [provider]  gabapentin (NEURONTIN) 100 MG capsule Take 1 capsule (100 mg total) by mouth 3 (three) times daily. Patient taking differently: Take 200 mg by mouth 2 (two) times daily. 10/19/21 12/18/21  Hosie Poisson, MD  hydrALAZINE (APRESOLINE) 10 MG tablet Take 10 mg by mouth daily.    [provider]  icosapent Ethyl (VASCEPA) 1 g capsule TAKE 2 CAPSULES (2 G TOTAL) BY MOUTH TWO TIMES DAILY. Patient taking differently: Take 2 g by mouth 2 (two) times daily. 03/27/20 12/06/21  [name], MD  insulin aspart (NOVOLOG) 100 UNIT/ML injection CBG 70 - 120: 0 units  CBG 121 - 150: 2 units  CBG 151 - 200: 3 units  CBG 201 - 250: 5 units  CBG 251 - 300: 8 units  CBG 301 - 350: 11 units  CBG 351 - 400: 15 units Patient taking differently: Inject 0-15 Units into the skin 3 (three) times daily with meals. CBG 70 - 120: 0 units  CBG 121 - 150: 2 units  CBG 151 - 200: 3 units  CBG 201 - 250: 5 units  CBG 251 - 300: 8 units  CBG 301 - 350: 11 units  CBG 351  - 400: 15 units 10/19/21   Hosie Poisson, MD  insulin aspart (NOVOLOG) 100 UNIT/ML injection  Inject 4 Units into the skin 3 (three) times daily with meals. 12/09/21   Ghimire, Henreitta Leber, MD  insulin glargine-yfgn (SEMGLEE) 100 UNIT/ML injection Inject 0.28 mLs (28 Units total) into the skin daily. 12/09/21   Ghimire, Henreitta Leber, MD  Lancets [facility] ULTRASOFT) lancets Use as instructed 02/16/20   [name], FNP  loperamide (IMODIUM) 2 MG capsule Take 1 capsule (2 mg total) by mouth 4 (four) times daily as needed for diarrhea or loose stools. 11/21/21   Redwine, Madison A, PA-C  mupirocin cream (BACTROBAN) 2 % Apply topically daily. Patient not taking: Reported on 12/06/2021 10/20/21   Hosie Poisson, MD  nitroGLYCERIN (NITROSTAT) 0.4 MG SL tablet Place 0.4 mg under the tongue every 5 (five) minutes as needed for chest pain.    [provider]  Oxycodone HCl 10 MG TABS Take 10 mg by mouth every 4 (four) hours as needed (For pain). 10/31/21   [provider]  pantoprazole (PROTONIX) 40 MG tablet Take 1 tablet (40 mg total) by mouth daily. 10/19/21   Hosie Poisson, MD  senna (SENOKOT) 8.6 MG TABS tablet Take 2 tablets (17.2 mg total) by mouth at bedtime. 10/19/21   Hosie Poisson, MD  sodium bicarbonate 650 MG tablet Take 1 tablet (650 mg total) by mouth 2 (two) times daily. 10/19/21   Hosie Poisson, MD    Physical Exam: Vitals:   12/14/21 1415 12/14/21 1453 12/14/21 1458 12/14/21 1554  BP: (!) 152/93   130/76  Pulse: 75 73 73 72  Resp: 18 16 15 13   Temp:    98.6 F (37 C)  TempSrc:      SpO2: 98% 97% 96% 92%  Weight:      Height:        Constitutional: NAD, calm, comfortable Vitals:   12/14/21 1415 12/14/21 1453 12/14/21 1458 12/14/21 1554  BP: (!) 152/93   130/76  Pulse: 75 73 73 72  Resp: 18 16 15 13   Temp:    98.6 F (37 C)  TempSrc:      SpO2: 98% 97% 96% 92%  Weight:      Height:       Eyes: PERRL, lids and conjunctivae normal ENMT: Mucous membranes are dry.  Posterior pharynx clear of any exudate or lesions.Normal dentition.  Neck: normal, supple, no masses, no thyromegaly Respiratory: clear to auscultation bilaterally, no wheezing, no crackles. Normal respiratory effort. No accessory muscle use.  Cardiovascular: Regular rate and rhythm, no murmurs / rubs / gallops. No extremity edema. 2+ pedal pulses. No carotid bruits.  Abdomen: no tenderness, no masses palpated. No hepatosplenomegaly. Bowel sounds positive.  Musculoskeletal: no clubbing / cyanosis. No joint deformity upper and lower extremities. Good ROM, no contractures. Normal muscle tone.  Skin: no rashes, lesions, ulcers. No induration Neurologic: CN 2-12 grossly intact. Sensation intact, DTR normal. Strength 5/5 in all 4.  Psychiatric: Normal judgment and insight. Alert and oriented x 3. Normal mood.     Labs on Admission: I have personally reviewed following labs and imaging studies  CBC: Recent Labs  Lab 12/08/21 0532 12/14/21 0814 12/14/21 1439  WBC 7.7 8.8  --   NEUTROABS 5.5  --   --   HGB 11.0* 11.7* 11.6*  HCT 33.1* 38.4* 34.0*  MCV 87.6 92.8  --   PLT 238 257  --    Basic Metabolic Panel: Recent Labs  Lab 12/08/21 0532 12/14/21 0814 12/14/21 1439  NA 132* 126* 126*  K 3.8 3.8 4.1  CL  105 96*  --   CO2 20* 16*  --   GLUCOSE 355* 624*  --   BUN 39* 47*  --   CREATININE 4.06* 3.86*  --   CALCIUM 8.5* 8.7*  --   MG 1.9  --   --    GFR: Estimated Creatinine Clearance: 27.2 mL/min (A) (by C-G formula based on SCr of 3.86 mg/dL (H)). Liver Function Tests: Recent Labs  Lab 12/14/21 0814  AST 14*  ALT 15  ALKPHOS 116  BILITOT 0.6  PROT 7.5  ALBUMIN 2.9*   Recent Labs  Lab 12/14/21 0814  LIPASE 38   No results for input(s): "AMMONIA" in the last 168 hours. Coagulation Profile: No results for input(s): "INR", "PROTIME" in the last 168 hours. Cardiac Enzymes: No results for input(s): "CKTOTAL", "CKMB", "CKMBINDEX", "TROPONINI" in the last 168  hours. BNP (last 3 results) No results for input(s): "PROBNP" in the last 8760 hours. HbA1C: No results for input(s): "HGBA1C" in the last 72 hours. CBG: Recent Labs  Lab 12/08/21 1633 12/08/21 2125 12/09/21 0801 12/09/21 1151 12/14/21 1553  GLUCAP 186* 185* 295* 332* 557*   Lipid Profile: No results for input(s): "CHOL", "HDL", "LDLCALC", "TRIG", "CHOLHDL", "LDLDIRECT" in the last 72 hours. Thyroid Function Tests: No results for input(s): "TSH", "T4TOTAL", "FREET4", "T3FREE", "THYROIDAB" in the last 72 hours. Anemia Panel: No results for input(s): "VITAMINB12", "FOLATE", "FERRITIN", "TIBC", "IRON", "RETICCTPCT" in the last 72 hours. Urine analysis:    Component Value Date/Time   COLORURINE YELLOW 12/14/2021 0813   APPEARANCEUR CLOUDY (A) 12/14/2021 0813   LABSPEC 1.014 12/14/2021 0813   PHURINE 5.0 12/14/2021 0813   GLUCOSEU >=500 (A) 12/14/2021 0813   HGBUR SMALL (A) 12/14/2021 0813   BILIRUBINUR NEGATIVE 12/14/2021 0813   BILIRUBINUR negative 09/07/2018 1034   KETONESUR NEGATIVE 12/14/2021 0813   PROTEINUR >=300 (A) 12/14/2021 0813   UROBILINOGEN 1.0 09/07/2018 1034   UROBILINOGEN 1.0 01/01/2017 1032   NITRITE NEGATIVE 12/14/2021 0813   LEUKOCYTESUR LARGE (A) 12/14/2021 0813    Radiological Exams on Admission: No results found.  EKG: Independently reviewed.  Sinus, no acute ST changes.  Assessment/Plan Active Problems:   Hyperglycemia due to diabetes mellitus ([place])   Hyperglycemia  (please populate well all problems here in Problem List. (For example, if patient is on BP meds at home and you resume or decide to hold them, it is a problem that needs to be her. Same for CAD, COPD, HLD and so on)  Hyperglycemia -Beta hydroxy negative, pH normal, no DKA.  Given that his sugar already started trending down with IV fluids, decided to discontinue insulin drip.  Patient reported poor intake including fluid for last 2 days and clinically appears to be severely dehydrated  and volume contracted, will order a total of 5 L IV bolus.  Monitor kidney function and urine output. -Resume Lantus, with reduced dose of 15 units daily and 3 times daily AC 4 units NovoLog as well as sliding scale plus at bedtime coverage to control his sugar.  Acute on chronic diarrhea -Unsure etiology, CT present in the ED. -No systemic inflammation signs no white count however noticed patient age HIV and in May CD4 count 129 with low HIV copy.  Recheck CD4, and follow-up stool culture, no indication for antibiotics at this point.  Generalized weakness -Likely from severe dehydration and hyperglycemia, correct hyperglycemia and dehydration then reevaluate.  CKD stage IV -Volume contracted, creatinine level stable, management as above.  HTN -Despite dehydration, blood pressure stable,  resume home BP meds  HIV -Last CD4= 129 in May, continue Dapsone -On Dovato  CAD -Continue Plavix and statin and Zetia   DVT prophylaxis: Heparin subcu Code Status: Full code Family Communication: None at bedside Disposition Plan: Expect less than 2 midnight hospital stay Consults called: None Admission status: MedSurg observation   Lequita Halt MD [facility] Pager [phone]  12/14/2021, 4:43 PM

## 2021-12-15 DIAGNOSIS — E729 Disorder of amino-acid metabolism, unspecified: Secondary | ICD-10-CM | POA: Diagnosis not present

## 2021-12-15 DIAGNOSIS — E1100 Type 2 diabetes mellitus with hyperosmolarity without nonketotic hyperglycemic-hyperosmolar coma (NKHHC): Secondary | ICD-10-CM | POA: Diagnosis not present

## 2021-12-15 DIAGNOSIS — Z794 Long term (current) use of insulin: Secondary | ICD-10-CM | POA: Diagnosis not present

## 2021-12-15 DIAGNOSIS — E1165 Type 2 diabetes mellitus with hyperglycemia: Secondary | ICD-10-CM | POA: Diagnosis not present

## 2021-12-15 LAB — BASIC METABOLIC PANEL
Anion gap: 8 (ref 5–15)
BUN: 42 mg/dL — ABNORMAL HIGH (ref 6–20)
CO2: 18 mmol/L — ABNORMAL LOW (ref 22–32)
Calcium: 8.3 mg/dL — ABNORMAL LOW (ref 8.9–10.3)
Chloride: 108 mmol/L (ref 98–111)
Creatinine, Ser: 3.31 mg/dL — ABNORMAL HIGH (ref 0.61–1.24)
GFR, Estimated: 21 mL/min — ABNORMAL LOW (ref >60)
Glucose, Bld: 312 mg/dL — ABNORMAL HIGH (ref 70–99)
Potassium: 3.5 mmol/L (ref 3.5–5.1)
Sodium: 134 mmol/L — ABNORMAL LOW (ref 135–145)

## 2021-12-15 LAB — CBC
HCT: 32.5 % — ABNORMAL LOW (ref 39.0–52.0)
Hemoglobin: 11 g/dL — ABNORMAL LOW (ref 13.0–17.0)
MCH: 29.5 pg (ref 26.0–34.0)
MCHC: 33.8 g/dL (ref 30.0–36.0)
MCV: 87.1 fL (ref 80.0–100.0)
Platelets: 261 10*3/uL (ref 150–400)
RBC: 3.73 MIL/uL — ABNORMAL LOW (ref 4.22–5.81)
RDW: 13.5 % (ref 11.5–15.5)
WBC: 8.4 10*3/uL (ref 4.0–10.5)
nRBC: 0 % (ref 0.0–0.2)

## 2021-12-15 LAB — GLUCOSE, CAPILLARY
Glucose-Capillary: 281 mg/dL — ABNORMAL HIGH (ref 70–99)
Glucose-Capillary: 292 mg/dL — ABNORMAL HIGH (ref 70–99)

## 2021-12-15 MED ORDER — INSULIN ASPART 100 UNIT/ML IJ SOLN: INTRAMUSCULAR | 11 refills | Status: DC

## 2021-12-15 MED ORDER — AMLODIPINE BESYLATE 10 MG PO TABS
10.0000 mg | ORAL_TABLET | Freq: Every day | ORAL | Status: DC
  Administered 2021-12-15: 10 mg via ORAL
  Filled 2021-12-15: qty 1

## 2021-12-15 MED ORDER — ORAL CARE MOUTH RINSE: 15.0000 mL | OROMUCOSAL | Status: DC | PRN

## 2021-12-15 MED ORDER — HYDRALAZINE HCL 20 MG/ML IJ SOLN
10.0000 mg | Freq: Four times a day (QID) | INTRAMUSCULAR | Status: DC | PRN
  Administered 2021-12-15: 10 mg via INTRAVENOUS
  Filled 2021-12-15: qty 1

## 2021-12-15 MED ORDER — DOCUSATE SODIUM 100 MG PO CAPS: 100.0000 mg | ORAL_CAPSULE | Freq: Two times a day (BID) | ORAL | 0 refills | Status: DC | PRN

## 2021-12-15 MED ORDER — SODIUM BICARBONATE 650 MG PO TABS
650.0000 mg | ORAL_TABLET | Freq: Three times a day (TID) | ORAL | Status: DC
  Administered 2021-12-15: 650 mg via ORAL
  Filled 2021-12-15 (×2): qty 1

## 2021-12-15 MED ORDER — INSULIN GLARGINE-YFGN 100 UNIT/ML ~~LOC~~ SOLN: 28.0000 [IU] | Freq: Every day | SUBCUTANEOUS | Status: DC

## 2021-12-15 MED ORDER — LANTUS 100 UNIT/ML ~~LOC~~ SOLN: 28.0000 [IU] | Freq: Every day | SUBCUTANEOUS | 0 refills | Status: DC

## 2021-12-15 MED ORDER — SODIUM BICARBONATE 650 MG PO TABS: 650.0000 mg | ORAL_TABLET | Freq: Three times a day (TID) | ORAL | 0 refills | Status: DC

## 2021-12-15 MED ORDER — LOPERAMIDE HCL 2 MG PO CAPS: 2.0000 mg | ORAL_CAPSULE | Freq: Four times a day (QID) | ORAL | 0 refills | Status: DC | PRN

## 2021-12-15 MED ORDER — ALLOPURINOL 100 MG PO TABS
100.0000 mg | ORAL_TABLET | Freq: Every day | ORAL | Status: DC
  Administered 2021-12-15: 100 mg via ORAL
  Filled 2021-12-15: qty 1

## 2021-12-15 MED ORDER — INSULIN GLARGINE-YFGN 100 UNIT/ML ~~LOC~~ SOLN
30.0000 [IU] | Freq: Every day | SUBCUTANEOUS | Status: DC
  Filled 2021-12-15: qty 0.3

## 2021-12-15 NOTE — Progress Notes (Addendum)
No acute distress or pain noted. Printed AVS provided.  Patient has a car parked here. Driving home.  Patient says he requires to be picked up from a friend at 1300. There has been delay with the pick up. Still waiting.

## 2021-12-15 NOTE — Progress Notes (Addendum)
New Admission Note:  Arrival Method: By stretcher from ED Mental Orientation: Alert and oriented x 4 Telemetry: Box 1, CCMD notified Assessment: Completed Skin: Completed, refer to flowsheets IV: Right hand Pain: Denies Tubes: None Safety Measures: Safety Fall Prevention Plan was given, discussed and signed. Admission: Completed [facility] Orientation: Patient has been orientated to the room, unit and the staff. Family: None  Orders have been reviewed and implemented. Will continue to monitor the patient. Call light has been placed within reach and bed alarm has been activated.   Fara Olden, RN  Phone Number: [phone]

## 2021-12-15 NOTE — Discharge Summary (Signed)
Physician Discharge Summary   Patient: Keith [name] MRN: [id number] DOB: 09-Aug-1965  Admit date:     12/14/2021  Discharge date: 12/15/21  Discharge Physician: Berle Mull  [name], NP  Recommendations at discharge: Follow-up with PCP in 1 week.   Follow-up Information     [name], NP. Schedule an appointment as soon as possible for a visit in 1 week(s).   Specialty: Adult Health Nurse Practitioner Contact information: [address] [place] [address] [phone]                Discharge Diagnoses: Principal Problem:   Uncontrolled type 2 diabetes mellitus with hyperglycemia, with long-term current use of insulin with renal complication Active Problems:   HTN (hypertension)   Hyperlipidemia   HIV disease (HCC)   CKD (chronic kidney disease) stage 4, GFR 15-29 ml/min (HCC)   Hx of BKA, right (HCC)   PAD (peripheral artery disease) (HCC)   Obesity   Hyperglycemia  Assessment and Plan  Uncontrolled type 2 diabetes mellitus with hyperglycemia, with long-term current use of insulin with renal complication Patient was only using 20 units of insulin Lantus at home.  Recently was discharged from hospital on 28 units of insulin Lantus. Most likely presentation is secondary to noncompliance. Patient also ran out of his short acting insulin. Patient with blood sugars more than 600 range.  No evidence of DKA.  No evidence of hyperglycemic hyperosmolar state as well. Treated with IV fluids.  1 dose of IV insulin as well as resumption of home insulin. At present appears to be well hydrated. Sugars well controlled.  Eating okay. We will discharge with home sliding scale regimen. New prescription provided for that. We will also discharge with insulin Lantus at 28 units. New prescription provided for that as well. Follow-up with PCP for further adjustment.  HTN (hypertension) Blood pressure well controlled. Continue home  regimen.  Hyperlipidemia Continue statin.  HIV disease ([facility]) Continue home regimen. Outpatient follow-up with PCP.  CKD (chronic kidney disease) stage 4, GFR 15-29 ml/min (HCC) Renal function around current baseline. Outpatient follow-up with PCP.  Hx of BKA, right (HCC) Left AKA. Ambulates with a prosthesis. Continue chronic dressing changes.  PAD (peripheral artery disease) (HCC) Continue aspirin Plavix.  Obesity Body mass index is 39 kg/m.  Placing the pt at higher risk of poor outcomes.    Consultants:  none  Procedures performed:  none  DISCHARGE MEDICATION: Allergies as of 12/15/2021       Reactions   Elemental Sulfur Itching   Patient stated he's allergic to "sulfur" AND "sulfa"   Sulfa Antibiotics Itching        Medication List     STOP taking these medications    insulin glargine-yfgn 100 UNIT/ML injection Commonly known as: SEMGLEE   senna 8.6 MG Tabs tablet Commonly known as: SENOKOT       TAKE these medications    allopurinol 100 MG tablet Commonly known as: ZYLOPRIM Take 100 mg by mouth daily. What changed: Another medication with the same name was removed. Continue taking this medication, and follow the directions you see here.   amLODipine 10 MG tablet Commonly known as: NORVASC Take 10 mg by mouth daily.   aspirin EC 81 MG tablet Take 81 mg by mouth daily. Swallow whole.   atorvastatin 80 MG tablet Commonly known as: LIPITOR Take 1 tablet (80 mg total) by mouth daily.   carvedilol 3.125 MG tablet Commonly known as:  COREG Take 3.125 mg by mouth 2 (two) times daily with a meal.   clopidogrel 75 MG tablet Commonly known as: PLAVIX Take 1 tablet (75 mg total) by mouth daily.   cyclobenzaprine 10 MG tablet Commonly known as: FLEXERIL Take 10 mg by mouth daily as needed for muscle spasms.   dapsone 100 MG tablet TAKE 1 TABLET (100 MG TOTAL) BY MOUTH DAILY.   docusate sodium 100 MG capsule Commonly known as:  Colace Take 1 capsule (100 mg total) by mouth 2 (two) times daily as needed for mild constipation (take when no BM for 1 day.).   doravirine 100 MG Tabs tablet Commonly known as: PIFELTRO Take 1 tablet (100 mg total) by mouth daily.   Dovato 50-300 MG tablet Generic drug: dolutegravir-lamiVUDine Take 1 tablet by mouth daily.   escitalopram 20 MG tablet Commonly known as: LEXAPRO Take 1 tablet (20 mg total) by mouth daily.   ezetimibe 10 MG tablet Commonly known as: ZETIA Take 1 tablet (10 mg total) by mouth daily.   ferrous sulfate 325 (65 FE) MG tablet Take 1 tablet (325 mg total) by mouth every Monday, Wednesday, and Friday at 6 PM. Do not take with other medicines.   finasteride 5 MG tablet Commonly known as: PROSCAR Take 5 mg by mouth daily.   gabapentin 100 MG capsule Commonly known as: NEURONTIN Take 1 capsule (100 mg total) by mouth 3 (three) times daily. What changed:  how much to take when to take this   hydrALAZINE 10 MG tablet Commonly known as: APRESOLINE Take 10 mg by mouth daily.   insulin aspart 100 UNIT/ML injection Commonly known as: novoLOG CBG 70 - 120: 0 units  CBG 121 - 150: 2 units  CBG 151 - 200: 3 units  CBG 201 - 250: 5 units  CBG 251 - 300: 8 units  CBG 301 - 350: 11 units  CBG 351 - 400: 15 units What changed:  how much to take how to take this when to take this   insulin aspart 100 UNIT/ML injection Commonly known as: novoLOG Inject 4 Units into the skin 3 (three) times daily with meals. What changed: Another medication with the same name was changed. Make sure you understand how and when to take each.   Lantus 100 UNIT/ML injection Generic drug: insulin glargine Inject 0.28 mLs (28 Units total) into the skin at bedtime. What changed:  how much to take when to take this   loperamide 2 MG capsule Commonly known as: IMODIUM Take 1 capsule (2 mg total) by mouth 4 (four) times daily as needed (for more than 2 BM in 1  days). What changed: reasons to take this   mupirocin cream 2 % Commonly known as: BACTROBAN Apply topically daily.   nitroGLYCERIN 0.4 MG SL tablet Commonly known as: NITROSTAT Place 0.4 mg under the tongue every 5 (five) minutes as needed for chest pain.   onetouch ultrasoft lancets Use as instructed   Oxycodone HCl 10 MG Tabs Take 10 mg by mouth every 4 (four) hours as needed (For pain).   pantoprazole 40 MG tablet Commonly known as: PROTONIX Take 1 tablet (40 mg total) by mouth daily.   sodium bicarbonate 650 MG tablet Take 1 tablet (650 mg total) by mouth 3 (three) times daily. What changed: when to take this   Vascepa 1 g capsule Generic drug: icosapent Ethyl TAKE 2 CAPSULES (2 G TOTAL) BY MOUTH TWO TIMES DAILY. What changed:  how much to take  how to take this when to take this               Discharge Care Instructions  (From admission, onward)           Start     Ordered   12/15/21 0000  Discharge wound care:       Comments: Apply Bactroban to left stump Q day, then cover with foam dressing.  (Change foam dressing every 3 days or as needed for soiling.)   12/15/21 1032           Disposition: Home Diet recommendation: Carb modified diet  Discharge Exam: Vitals:   12/14/21 2100 12/14/21 2138 12/15/21 0412 12/15/21 0931  BP: 120/64 (!) 140/93 (!) 171/106 (!) 158/84  Pulse: 71 73 77 79  Resp: 15 18  18   Temp: 98.6 F (37 C) 98.2 F (36.8 C) 98.3 F (36.8 C) 98 F (36.7 C)  TempSrc: Oral   Oral  SpO2: 97% 99% 100% 98%  Weight:  123.3 kg    Height:       General: Appear in no distress; no visible Abnormal Neck Mass Or lumps, Conjunctiva normal Cardiovascular: S1 and S2 Present, no Murmur, Respiratory: good respiratory effort, Bilateral Air entry present and CTA, no Crackles, no wheezes Abdomen: Bowel Sound present, Non tender  Extremities: Left AKA, right BKA Neurology: alert and oriented to time, place, and person  [facility] Weights    12/14/21 0809 12/14/21 2138  Weight: 117.9 kg 123.3 kg   Condition at discharge: stable  The results of significant diagnostics from this hospitalization (including imaging, microbiology, ancillary and laboratory) are listed below for reference.   Imaging Studies: DG Abd 1 View  Result Date: 12/07/2021 CLINICAL DATA:  A 56 year old male presents with nausea and vomiting. EXAM: ABDOMEN - 1 VIEW COMPARISON:  December of 2017. FINDINGS: Incidental imaging of the lung bases is unremarkable. Moderate stool and gas in the rectum. Scattered gas-filled loops of colon also with mild colonic stool in the area of the ascending colon. Due to patient body habitus a portion of the RIGHT flank is excluded. There is scattered gas-filled loops of small bowel as well but without substantial distension. On limited assessment there is no acute skeletal process IMPRESSION: Query mild constipation. Scattered gas-filled loops of small bowel may reflect mild ileus. No signs of obstruction. RIGHT flank partially excluded from view on today's evaluation. Correlate with any localizing symptoms in this area or signs of hernia. Could consider repeat imaging as warranted. Electronically Signed   By: Zetta Bills M.D.   On: 12/07/2021 09:52    Microbiology: Results for orders placed or performed during the hospital encounter of 12/05/21  Urine Culture     Status: Abnormal   Collection Time: 12/06/21  8:30 AM   Specimen: Urine, Clean Catch  Result Value Ref Range Status   Specimen Description URINE, CLEAN CATCH  Final   Special Requests   Final    NONE Performed at [facility], [place] [address] [place], [place] [address]    Culture (A)  Final    >=100,000 COLONIES/mL KLEBSIELLA PNEUMONIAE Confirmed Extended Spectrum Beta-Lactamase Producer (ESBL).  In bloodstream infections from ESBL organisms, carbapenems are preferred over piperacillin/tazobactam. They are shown to have a lower risk of mortality.    Report  Status 12/08/2021 FINAL  Final   Organism ID, Bacteria KLEBSIELLA PNEUMONIAE (A)  Final      Susceptibility   Klebsiella pneumoniae - MIC*    AMPICILLIN >=  32 RESISTANT Resistant     CEFAZOLIN >=64 RESISTANT Resistant     CEFEPIME >=32 RESISTANT Resistant     CEFTRIAXONE >=64 RESISTANT Resistant     CIPROFLOXACIN 1 RESISTANT Resistant     GENTAMICIN >=16 RESISTANT Resistant     IMIPENEM <=0.25 SENSITIVE Sensitive     NITROFURANTOIN 64 INTERMEDIATE Intermediate     TRIMETH/SULFA >=320 RESISTANT Resistant     AMPICILLIN/SULBACTAM >=32 RESISTANT Resistant     PIP/TAZO 16 SENSITIVE Sensitive     * >=100,000 COLONIES/mL KLEBSIELLA PNEUMONIAE   *Note: Due to a large number of results and/or encounters for the requested time period, some results have not been displayed. A complete set of results can be found in Results Review.   Labs: CBC: Recent Labs  Lab 12/14/21 0814 12/14/21 1439 12/15/21 0638  WBC 8.8  --  8.4  HGB 11.7* 11.6* 11.0*  HCT 38.4* 34.0* 32.5*  MCV 92.8  --  87.1  PLT 257  --  038   Basic Metabolic Panel: Recent Labs  Lab 12/14/21 0814 12/14/21 1439 12/14/21 2205 12/15/21 0638  NA 126* 126* 132* 134*  K 3.8 4.1 3.5 3.5  CL 96*  --  103 108  CO2 16*  --  19* 18*  GLUCOSE 624*  --  405* 312*  BUN 47*  --  46* 42*  CREATININE 3.86*  --  3.55* 3.31*  CALCIUM 8.7*  --  8.4* 8.3*   Liver Function Tests: Recent Labs  Lab 12/14/21 0814  AST 14*  ALT 15  ALKPHOS 116  BILITOT 0.6  PROT 7.5  ALBUMIN 2.9*   CBG: Recent Labs  Lab 12/14/21 1724 12/14/21 1854 12/14/21 2138 12/15/21 0712 12/15/21 1144  GLUCAP 577* 472* 386* 281* 292*    Discharge time spent: greater than 30 minutes.  Signed: Berle Mull, MD [facility]

## 2021-12-15 NOTE — Progress Notes (Signed)
BP is 171/106, P 77. RN messaged MD on call to see if we can get a prn order for BP. Awaiting response.   Keith [name] [name]

## 2021-12-17 ENCOUNTER — Encounter (HOSPITAL_COMMUNITY): Payer: Self-pay

## 2021-12-17 ENCOUNTER — Inpatient Hospital Stay (HOSPITAL_COMMUNITY)
Admission: EM | Admit: 2021-12-17 | Discharge: 2021-12-20 | DRG: 638 | Disposition: A | Discharge status: Home / Self Care | Payer: Medicare Other | Attending: Family Medicine | Admitting: Family Medicine

## 2021-12-17 ENCOUNTER — Other Ambulatory Visit: Payer: Self-pay

## 2021-12-17 DIAGNOSIS — Z8673 Personal history of transient ischemic attack (TIA), and cerebral infarction without residual deficits: Secondary | ICD-10-CM

## 2021-12-17 DIAGNOSIS — Z794 Long term (current) use of insulin: Secondary | ICD-10-CM

## 2021-12-17 DIAGNOSIS — I739 Peripheral vascular disease, unspecified: Secondary | ICD-10-CM | POA: Diagnosis present

## 2021-12-17 DIAGNOSIS — N390 Urinary tract infection, site not specified: Secondary | ICD-10-CM | POA: Diagnosis present

## 2021-12-17 DIAGNOSIS — K219 Gastro-esophageal reflux disease without esophagitis: Secondary | ICD-10-CM | POA: Diagnosis present

## 2021-12-17 DIAGNOSIS — N400 Enlarged prostate without lower urinary tract symptoms: Secondary | ICD-10-CM | POA: Diagnosis present

## 2021-12-17 DIAGNOSIS — R197 Diarrhea, unspecified: Secondary | ICD-10-CM | POA: Diagnosis present

## 2021-12-17 DIAGNOSIS — Z79899 Other long term (current) drug therapy: Secondary | ICD-10-CM

## 2021-12-17 DIAGNOSIS — E1122 Type 2 diabetes mellitus with diabetic chronic kidney disease: Secondary | ICD-10-CM | POA: Diagnosis present

## 2021-12-17 DIAGNOSIS — R739 Hyperglycemia, unspecified: Secondary | ICD-10-CM

## 2021-12-17 DIAGNOSIS — Z8249 Family history of ischemic heart disease and other diseases of the circulatory system: Secondary | ICD-10-CM

## 2021-12-17 DIAGNOSIS — G8929 Other chronic pain: Secondary | ICD-10-CM | POA: Diagnosis present

## 2021-12-17 DIAGNOSIS — I10 Essential (primary) hypertension: Secondary | ICD-10-CM | POA: Diagnosis present

## 2021-12-17 DIAGNOSIS — E729 Disorder of amino-acid metabolism, unspecified: Secondary | ICD-10-CM | POA: Diagnosis present

## 2021-12-17 DIAGNOSIS — I129 Hypertensive chronic kidney disease with stage 1 through stage 4 chronic kidney disease, or unspecified chronic kidney disease: Secondary | ICD-10-CM | POA: Diagnosis present

## 2021-12-17 DIAGNOSIS — Z89511 Acquired absence of right leg below knee: Secondary | ICD-10-CM

## 2021-12-17 DIAGNOSIS — B9620 Unspecified Escherichia coli [E. coli] as the cause of diseases classified elsewhere: Secondary | ICD-10-CM | POA: Diagnosis present

## 2021-12-17 DIAGNOSIS — E1142 Type 2 diabetes mellitus with diabetic polyneuropathy: Secondary | ICD-10-CM | POA: Diagnosis present

## 2021-12-17 DIAGNOSIS — E785 Hyperlipidemia, unspecified: Secondary | ICD-10-CM | POA: Diagnosis present

## 2021-12-17 DIAGNOSIS — R8271 Bacteriuria: Principal | ICD-10-CM

## 2021-12-17 DIAGNOSIS — N184 Chronic kidney disease, stage 4 (severe): Secondary | ICD-10-CM | POA: Diagnosis present

## 2021-12-17 DIAGNOSIS — Z955 Presence of coronary angioplasty implant and graft: Secondary | ICD-10-CM

## 2021-12-17 DIAGNOSIS — Z7902 Long term (current) use of antithrombotics/antiplatelets: Secondary | ICD-10-CM

## 2021-12-17 DIAGNOSIS — B20 Human immunodeficiency virus [HIV] disease: Secondary | ICD-10-CM | POA: Diagnosis present

## 2021-12-17 DIAGNOSIS — E1151 Type 2 diabetes mellitus with diabetic peripheral angiopathy without gangrene: Secondary | ICD-10-CM | POA: Diagnosis present

## 2021-12-17 DIAGNOSIS — K8020 Calculus of gallbladder without cholecystitis without obstruction: Secondary | ICD-10-CM | POA: Diagnosis present

## 2021-12-17 DIAGNOSIS — I252 Old myocardial infarction: Secondary | ICD-10-CM

## 2021-12-17 DIAGNOSIS — I2510 Atherosclerotic heart disease of native coronary artery without angina pectoris: Secondary | ICD-10-CM | POA: Diagnosis present

## 2021-12-17 DIAGNOSIS — E1100 Type 2 diabetes mellitus with hyperosmolarity without nonketotic hyperglycemic-hyperosmolar coma (NKHHC): Principal | ICD-10-CM | POA: Diagnosis present

## 2021-12-17 DIAGNOSIS — Z89612 Acquired absence of left leg above knee: Secondary | ICD-10-CM

## 2021-12-17 DIAGNOSIS — N529 Male erectile dysfunction, unspecified: Secondary | ICD-10-CM | POA: Diagnosis present

## 2021-12-17 DIAGNOSIS — F32A Depression, unspecified: Secondary | ICD-10-CM | POA: Diagnosis present

## 2021-12-17 DIAGNOSIS — Z8261 Family history of arthritis: Secondary | ICD-10-CM

## 2021-12-17 NOTE — ED Provider Triage Note (Signed)
Emergency Medicine Provider Triage Evaluation Note  Keith [name] , a 56 y.o. male  was evaluated in triage.  Pt complains of diarrhea for 3 weeks.  Per chart review, patient was discharged 2 days ago due to uncontrolled hyperglycemia.  Review of Systems  Positive: Diarrhea Negative: Nausea and vomiting  Physical Exam  BP (!) 160/119   Pulse 88   Temp 98.7 F (37.1 C) (Oral)   Resp 19   SpO2 94%  Gen:   Awake, no distress   Resp:  Normal effort  MSK:   Moves extremities without difficulty  Other:  Unkempt and odorous.  Dried stool noted on patient's clothing and fingers  Medical Decision Making  Medically screening exam initiated at 9:51 PM.  Appropriate orders placed.  Keith [name] was informed that the remainder of the evaluation will be completed by another provider, this initial triage assessment does not replace that evaluation, and the importance of remaining in the ED until their evaluation is complete.     Rhae Hammock, PA-C 12/17/21 2153

## 2021-12-17 NOTE — ED Triage Notes (Signed)
Pt reports diarrhea x3 weeks.  Reports being seen for same 3 days ago and prescribed meds with no relief.  Denies abt usage

## 2021-12-18 ENCOUNTER — Emergency Department (HOSPITAL_COMMUNITY): Payer: Medicare Other

## 2021-12-18 ENCOUNTER — Encounter (HOSPITAL_COMMUNITY): Payer: Self-pay | Admitting: Emergency Medicine

## 2021-12-18 DIAGNOSIS — E1122 Type 2 diabetes mellitus with diabetic chronic kidney disease: Secondary | ICD-10-CM | POA: Diagnosis present

## 2021-12-18 DIAGNOSIS — K219 Gastro-esophageal reflux disease without esophagitis: Secondary | ICD-10-CM | POA: Diagnosis present

## 2021-12-18 DIAGNOSIS — B9620 Unspecified Escherichia coli [E. coli] as the cause of diseases classified elsewhere: Secondary | ICD-10-CM | POA: Diagnosis present

## 2021-12-18 DIAGNOSIS — F32A Depression, unspecified: Secondary | ICD-10-CM | POA: Diagnosis present

## 2021-12-18 DIAGNOSIS — E785 Hyperlipidemia, unspecified: Secondary | ICD-10-CM | POA: Diagnosis present

## 2021-12-18 DIAGNOSIS — E1151 Type 2 diabetes mellitus with diabetic peripheral angiopathy without gangrene: Secondary | ICD-10-CM | POA: Diagnosis present

## 2021-12-18 DIAGNOSIS — N184 Chronic kidney disease, stage 4 (severe): Secondary | ICD-10-CM | POA: Diagnosis present

## 2021-12-18 DIAGNOSIS — Z79899 Other long term (current) drug therapy: Secondary | ICD-10-CM | POA: Diagnosis not present

## 2021-12-18 DIAGNOSIS — N390 Urinary tract infection, site not specified: Secondary | ICD-10-CM | POA: Diagnosis present

## 2021-12-18 DIAGNOSIS — K8020 Calculus of gallbladder without cholecystitis without obstruction: Secondary | ICD-10-CM | POA: Diagnosis present

## 2021-12-18 DIAGNOSIS — Z8249 Family history of ischemic heart disease and other diseases of the circulatory system: Secondary | ICD-10-CM | POA: Diagnosis not present

## 2021-12-18 DIAGNOSIS — N400 Enlarged prostate without lower urinary tract symptoms: Secondary | ICD-10-CM | POA: Diagnosis present

## 2021-12-18 DIAGNOSIS — R8271 Bacteriuria: Secondary | ICD-10-CM | POA: Diagnosis present

## 2021-12-18 DIAGNOSIS — E1100 Type 2 diabetes mellitus with hyperosmolarity without nonketotic hyperglycemic-hyperosmolar coma (NKHHC): Secondary | ICD-10-CM | POA: Diagnosis present

## 2021-12-18 DIAGNOSIS — G8929 Other chronic pain: Secondary | ICD-10-CM | POA: Diagnosis present

## 2021-12-18 DIAGNOSIS — I2510 Atherosclerotic heart disease of native coronary artery without angina pectoris: Secondary | ICD-10-CM | POA: Diagnosis present

## 2021-12-18 DIAGNOSIS — Z955 Presence of coronary angioplasty implant and graft: Secondary | ICD-10-CM | POA: Diagnosis not present

## 2021-12-18 DIAGNOSIS — E1142 Type 2 diabetes mellitus with diabetic polyneuropathy: Secondary | ICD-10-CM | POA: Diagnosis present

## 2021-12-18 DIAGNOSIS — E729 Disorder of amino-acid metabolism, unspecified: Secondary | ICD-10-CM | POA: Diagnosis present

## 2021-12-18 DIAGNOSIS — B20 Human immunodeficiency virus [HIV] disease: Secondary | ICD-10-CM | POA: Diagnosis present

## 2021-12-18 DIAGNOSIS — Z794 Long term (current) use of insulin: Secondary | ICD-10-CM | POA: Diagnosis not present

## 2021-12-18 DIAGNOSIS — Z89612 Acquired absence of left leg above knee: Secondary | ICD-10-CM | POA: Diagnosis not present

## 2021-12-18 DIAGNOSIS — Z8261 Family history of arthritis: Secondary | ICD-10-CM | POA: Diagnosis not present

## 2021-12-18 DIAGNOSIS — I129 Hypertensive chronic kidney disease with stage 1 through stage 4 chronic kidney disease, or unspecified chronic kidney disease: Secondary | ICD-10-CM | POA: Diagnosis present

## 2021-12-18 DIAGNOSIS — I252 Old myocardial infarction: Secondary | ICD-10-CM | POA: Diagnosis not present

## 2021-12-18 DIAGNOSIS — Z89511 Acquired absence of right leg below knee: Secondary | ICD-10-CM | POA: Diagnosis not present

## 2021-12-18 LAB — GASTROINTESTINAL PANEL BY PCR, STOOL (REPLACES STOOL CULTURE)

## 2021-12-18 LAB — CBC WITH DIFFERENTIAL/PLATELET
Abs Immature Granulocytes: 0.05 10*3/uL (ref 0.00–0.07)
Basophils Absolute: 0 10*3/uL (ref 0.0–0.1)
Basophils Relative: 0 %
Eosinophils Absolute: 0.2 10*3/uL (ref 0.0–0.5)
Eosinophils Relative: 2 %
HCT: 33.7 % — ABNORMAL LOW (ref 39.0–52.0)
Hemoglobin: 10.6 g/dL — ABNORMAL LOW (ref 13.0–17.0)
Immature Granulocytes: 1 %
Lymphocytes Relative: 15 %
Lymphs Abs: 1.5 10*3/uL (ref 0.7–4.0)
MCH: 28.7 pg (ref 26.0–34.0)
MCHC: 31.5 g/dL (ref 30.0–36.0)
MCV: 91.3 fL (ref 80.0–100.0)
Monocytes Absolute: 0.7 10*3/uL (ref 0.1–1.0)
Monocytes Relative: 7 %
Neutro Abs: 7.6 10*3/uL (ref 1.7–7.7)
Neutrophils Relative %: 75 %
Platelets: 285 10*3/uL (ref 150–400)
RBC: 3.69 MIL/uL — ABNORMAL LOW (ref 4.22–5.81)
RDW: 13.3 % (ref 11.5–15.5)
WBC: 10.1 10*3/uL (ref 4.0–10.5)
nRBC: 0 % (ref 0.0–0.2)

## 2021-12-18 LAB — URINALYSIS, ROUTINE W REFLEX MICROSCOPIC
Bilirubin Urine: NEGATIVE
Glucose, UA: >=500 mg/dL — AB
Ketones, ur: NEGATIVE mg/dL
Nitrite: NEGATIVE
Protein, ur: >=300 mg/dL — AB
Specific Gravity, Urine: 1.011 (ref 1.005–1.030)
WBC, UA: >50 WBC/hpf — ABNORMAL HIGH (ref 0–5)
pH: 5 (ref 5.0–8.0)

## 2021-12-18 LAB — GLUCOSE, CAPILLARY
Glucose-Capillary: 156 mg/dL — ABNORMAL HIGH (ref 70–99)
Glucose-Capillary: 150 mg/dL — ABNORMAL HIGH (ref 70–99)
Glucose-Capillary: 158 mg/dL — ABNORMAL HIGH (ref 70–99)
Glucose-Capillary: 218 mg/dL — ABNORMAL HIGH (ref 70–99)
Glucose-Capillary: 200 mg/dL — ABNORMAL HIGH (ref 70–99)
Glucose-Capillary: 217 mg/dL — ABNORMAL HIGH (ref 70–99)
Glucose-Capillary: 222 mg/dL — ABNORMAL HIGH (ref 70–99)
Glucose-Capillary: 175 mg/dL — ABNORMAL HIGH (ref 70–99)

## 2021-12-18 LAB — OSMOLALITY: Osmolality: 322 mOsm/kg (ref 275–295)

## 2021-12-18 LAB — BASIC METABOLIC PANEL
Anion gap: 10 (ref 5–15)
BUN: 50 mg/dL — ABNORMAL HIGH (ref 6–20)
CO2: 20 mmol/L — ABNORMAL LOW (ref 22–32)
Calcium: 8.5 mg/dL — ABNORMAL LOW (ref 8.9–10.3)
Chloride: 97 mmol/L — ABNORMAL LOW (ref 98–111)
Creatinine, Ser: 3.45 mg/dL — ABNORMAL HIGH (ref 0.61–1.24)
GFR, Estimated: 20 mL/min — ABNORMAL LOW (ref >60)
Glucose, Bld: 673 mg/dL (ref 70–99)
Potassium: 3.7 mmol/L (ref 3.5–5.1)
Sodium: 127 mmol/L — ABNORMAL LOW (ref 135–145)

## 2021-12-18 LAB — CBG MONITORING, ED
Glucose-Capillary: 535 mg/dL (ref 70–99)
Glucose-Capillary: 517 mg/dL (ref 70–99)
Glucose-Capillary: 432 mg/dL — ABNORMAL HIGH (ref 70–99)
Glucose-Capillary: 263 mg/dL — ABNORMAL HIGH (ref 70–99)
Glucose-Capillary: 153 mg/dL — ABNORMAL HIGH (ref 70–99)

## 2021-12-18 LAB — MRSA NEXT GEN BY PCR, NASAL: MRSA by PCR Next Gen: NOT DETECTED

## 2021-12-18 MED ORDER — DAPSONE 100 MG PO TABS
100.0000 mg | ORAL_TABLET | Freq: Every day | ORAL | Status: DC
  Administered 2021-12-18 – 2021-12-20 (×3): 100 mg via ORAL
  Filled 2021-12-18 (×3): qty 1

## 2021-12-18 MED ORDER — INSULIN REGULAR(HUMAN) IN NACL 100-0.9 UT/100ML-% IV SOLN
INTRAVENOUS | Status: DC
  Administered 2021-12-18: 11.5 [IU]/h via INTRAVENOUS
  Filled 2021-12-18 (×2): qty 100

## 2021-12-18 MED ORDER — ACETAMINOPHEN 325 MG PO TABS: 650.0000 mg | ORAL_TABLET | Freq: Four times a day (QID) | ORAL | Status: DC | PRN

## 2021-12-18 MED ORDER — ONDANSETRON HCL 4 MG/2ML IJ SOLN
4.0000 mg | Freq: Four times a day (QID) | INTRAMUSCULAR | Status: DC | PRN
  Administered 2021-12-18: 4 mg via INTRAVENOUS
  Filled 2021-12-18: qty 2

## 2021-12-18 MED ORDER — LACTATED RINGERS IV SOLN: INTRAVENOUS | Status: DC

## 2021-12-18 MED ORDER — LACTATED RINGERS IV BOLUS
500.0000 mL | INTRAVENOUS | Status: AC
  Administered 2021-12-18 (×2): 500 mL via INTRAVENOUS

## 2021-12-18 MED ORDER — INSULIN GLARGINE-YFGN 100 UNIT/ML ~~LOC~~ SOLN
28.0000 [IU] | Freq: Once | SUBCUTANEOUS | Status: AC
  Administered 2021-12-18: 28 [IU] via SUBCUTANEOUS
  Filled 2021-12-18: qty 0.28

## 2021-12-18 MED ORDER — LAMIVUDINE 150 MG PO TABS
300.0000 mg | ORAL_TABLET | Freq: Every day | ORAL | Status: DC
  Administered 2021-12-18 – 2021-12-20 (×3): 300 mg via ORAL
  Filled 2021-12-18 (×3): qty 2

## 2021-12-18 MED ORDER — DOLUTEGRAVIR SODIUM 50 MG PO TABS
50.0000 mg | ORAL_TABLET | Freq: Every day | ORAL | Status: DC
  Administered 2021-12-18 – 2021-12-20 (×3): 50 mg via ORAL
  Filled 2021-12-18 (×3): qty 1

## 2021-12-18 MED ORDER — ONDANSETRON HCL 4 MG PO TABS: 4.0000 mg | ORAL_TABLET | Freq: Four times a day (QID) | ORAL | Status: DC | PRN

## 2021-12-18 MED ORDER — SODIUM BICARBONATE 650 MG PO TABS
650.0000 mg | ORAL_TABLET | Freq: Two times a day (BID) | ORAL | Status: DC
  Administered 2021-12-18 – 2021-12-20 (×4): 650 mg via ORAL
  Filled 2021-12-18 (×4): qty 1

## 2021-12-18 MED ORDER — SODIUM CHLORIDE 0.9 % IV SOLN
1.0000 g | Freq: Two times a day (BID) | INTRAVENOUS | Status: DC
  Administered 2021-12-18 – 2021-12-19 (×3): 1 g via INTRAVENOUS
  Filled 2021-12-18 (×3): qty 20

## 2021-12-18 MED ORDER — ESCITALOPRAM OXALATE 20 MG PO TABS
20.0000 mg | ORAL_TABLET | Freq: Every day | ORAL | Status: DC
  Administered 2021-12-18 – 2021-12-20 (×3): 20 mg via ORAL
  Filled 2021-12-18 (×3): qty 1

## 2021-12-18 MED ORDER — DOLUTEGRAVIR-LAMIVUDINE 50-300 MG PO TABS: 1.0000 | ORAL_TABLET | Freq: Every day | ORAL | Status: DC

## 2021-12-18 MED ORDER — OXYCODONE HCL 5 MG PO TABS: 10.0000 mg | ORAL_TABLET | ORAL | Status: DC | PRN

## 2021-12-18 MED ORDER — ACETAMINOPHEN 650 MG RE SUPP: 650.0000 mg | Freq: Four times a day (QID) | RECTAL | Status: DC | PRN

## 2021-12-18 MED ORDER — ASPIRIN 81 MG PO TBEC
81.0000 mg | DELAYED_RELEASE_TABLET | Freq: Every day | ORAL | Status: DC
  Administered 2021-12-18 – 2021-12-20 (×3): 81 mg via ORAL
  Filled 2021-12-18 (×3): qty 1

## 2021-12-18 MED ORDER — HEPARIN SODIUM (PORCINE) 5000 UNIT/ML IJ SOLN
5000.0000 [IU] | Freq: Three times a day (TID) | INTRAMUSCULAR | Status: DC
  Administered 2021-12-18 – 2021-12-20 (×6): 5000 [IU] via SUBCUTANEOUS
  Filled 2021-12-18 (×6): qty 1

## 2021-12-18 MED ORDER — CARVEDILOL 3.125 MG PO TABS
3.1250 mg | ORAL_TABLET | Freq: Two times a day (BID) | ORAL | Status: DC
  Administered 2021-12-18 – 2021-12-20 (×4): 3.125 mg via ORAL
  Filled 2021-12-18 (×4): qty 1

## 2021-12-18 MED ORDER — HYDRALAZINE HCL 20 MG/ML IJ SOLN
INTRAMUSCULAR | Status: AC
  Administered 2021-12-18: 20 mg via INTRAVENOUS
  Filled 2021-12-18: qty 1

## 2021-12-18 MED ORDER — LISINOPRIL 10 MG PO TABS: 10.0000 mg | ORAL_TABLET | Freq: Every day | ORAL | Status: DC

## 2021-12-18 MED ORDER — NITROGLYCERIN 0.4 MG SL SUBL: 0.4000 mg | SUBLINGUAL_TABLET | SUBLINGUAL | Status: DC | PRN

## 2021-12-18 MED ORDER — DORAVIRINE 100 MG PO TABS
100.0000 mg | ORAL_TABLET | Freq: Every day | ORAL | Status: DC
  Administered 2021-12-19 – 2021-12-20 (×2): 100 mg via ORAL
  Filled 2021-12-18 (×3): qty 1

## 2021-12-18 MED ORDER — CHLORHEXIDINE GLUCONATE CLOTH 2 % EX PADS
6.0000 | MEDICATED_PAD | Freq: Every day | CUTANEOUS | Status: DC
  Administered 2021-12-19: 6 via TOPICAL

## 2021-12-18 MED ORDER — AMLODIPINE BESYLATE 10 MG PO TABS
10.0000 mg | ORAL_TABLET | Freq: Every day | ORAL | Status: DC
  Administered 2021-12-18 – 2021-12-20 (×3): 10 mg via ORAL
  Filled 2021-12-18 (×3): qty 1

## 2021-12-18 MED ORDER — ATORVASTATIN CALCIUM 40 MG PO TABS
80.0000 mg | ORAL_TABLET | Freq: Every day | ORAL | Status: DC
  Administered 2021-12-18 – 2021-12-20 (×3): 80 mg via ORAL
  Filled 2021-12-18 (×3): qty 2

## 2021-12-18 MED ORDER — DEXTROSE 50 % IV SOLN: 0.0000 mL | INTRAVENOUS | Status: DC | PRN

## 2021-12-18 MED ORDER — ENOXAPARIN SODIUM 30 MG/0.3ML IJ SOSY: 30.0000 mg | PREFILLED_SYRINGE | INTRAMUSCULAR | Status: DC

## 2021-12-18 MED ORDER — HYDRALAZINE HCL 20 MG/ML IJ SOLN: 20.0000 mg | Freq: Once | INTRAMUSCULAR | Status: AC

## 2021-12-18 MED ORDER — HYDRALAZINE HCL 10 MG PO TABS
10.0000 mg | ORAL_TABLET | Freq: Two times a day (BID) | ORAL | Status: DC
  Administered 2021-12-18 – 2021-12-20 (×4): 10 mg via ORAL
  Filled 2021-12-18 (×4): qty 1

## 2021-12-18 MED ORDER — CLOPIDOGREL BISULFATE 75 MG PO TABS
75.0000 mg | ORAL_TABLET | Freq: Every day | ORAL | Status: DC
  Administered 2021-12-18 – 2021-12-20 (×3): 75 mg via ORAL
  Filled 2021-12-18 (×3): qty 1

## 2021-12-18 MED ORDER — INSULIN GLARGINE-YFGN 100 UNIT/ML ~~LOC~~ SOLN
28.0000 [IU] | Freq: Every day | SUBCUTANEOUS | Status: DC
  Administered 2021-12-19: 28 [IU] via SUBCUTANEOUS
  Filled 2021-12-18 (×2): qty 0.28

## 2021-12-18 MED ORDER — INSULIN ASPART 100 UNIT/ML IJ SOLN
0.0000 [IU] | Freq: Three times a day (TID) | INTRAMUSCULAR | Status: DC
  Administered 2021-12-19: 8 [IU] via SUBCUTANEOUS
  Administered 2021-12-19: 15 [IU] via SUBCUTANEOUS
  Administered 2021-12-19: 8 [IU] via SUBCUTANEOUS
  Administered 2021-12-20: 2 [IU] via SUBCUTANEOUS

## 2021-12-18 MED ORDER — DORAVIRINE 100 MG PO TABS
100.0000 mg | ORAL_TABLET | Freq: Every day | ORAL | Status: DC
  Filled 2021-12-18: qty 1

## 2021-12-18 MED ORDER — FINASTERIDE 5 MG PO TABS
5.0000 mg | ORAL_TABLET | Freq: Every day | ORAL | Status: DC
  Administered 2021-12-18 – 2021-12-20 (×3): 5 mg via ORAL
  Filled 2021-12-18 (×3): qty 1

## 2021-12-18 MED ORDER — GABAPENTIN 100 MG PO CAPS
100.0000 mg | ORAL_CAPSULE | Freq: Two times a day (BID) | ORAL | Status: DC
  Administered 2021-12-18 – 2021-12-20 (×4): 100 mg via ORAL
  Filled 2021-12-18 (×4): qty 1

## 2021-12-18 MED ORDER — DEXTROSE IN LACTATED RINGERS 5 % IV SOLN: INTRAVENOUS | Status: DC

## 2021-12-18 MED ORDER — SODIUM CHLORIDE 0.9 % IV SOLN
2.0000 g | INTRAVENOUS | Status: DC
  Administered 2021-12-18: 2 g via INTRAVENOUS
  Filled 2021-12-18: qty 20

## 2021-12-18 MED ORDER — LOSARTAN POTASSIUM 50 MG PO TABS
50.0000 mg | ORAL_TABLET | Freq: Every day | ORAL | Status: DC
  Administered 2021-12-19 – 2021-12-20 (×2): 50 mg via ORAL
  Filled 2021-12-18 (×2): qty 1

## 2021-12-18 MED ORDER — PANTOPRAZOLE SODIUM 40 MG PO TBEC
40.0000 mg | DELAYED_RELEASE_TABLET | Freq: Every day | ORAL | Status: DC
  Administered 2021-12-18 – 2021-12-20 (×3): 40 mg via ORAL
  Filled 2021-12-18 (×3): qty 1

## 2021-12-18 NOTE — H&P (Signed)
History and Physical    Patient: Keith [name] NWG:[id number] DOB: November 25, 1965 DOA: 12/17/2021 DOS: the patient was seen and examined on 12/18/2021 [name], NP  Patient coming from: Home  Chief Complaint:  Chief Complaint  Patient presents with   Diarrhea   HPI: Keith [name] is a 56 y.o. male with medical history significant of right foot abscess, CAD, history of STEMI, AIDS, normocytic anemia, chronic right knee pain, stage IV CKD, history of CVA, aorta dilatation, rectal dysfunction, genital warts, GERD hyperlipidemia, hypertension, peripheral neuropathy, history of hand osteomyelitis, history of pneumonia, uncontrolled type 2 diabetes who is coming to the emergency department due to diarrhea for the past 3 weeks.  He has mild generalized pain, nausea, but no emesis, constipation, melena or hematochezia.  He has been admitted twice recently for uncontrolled DM.  He stated he was last discharged 3 days ago and has not had relief with the prescribed medications.  He has been having polyuria and polydipsia.  No flank pain or dysuria. He denied fever, chills, rhinorrhea, sore throat, wheezing or hemoptysis.  No chest pain, palpitations, diaphoresis, PND or orthopnea.  No flank pain, dysuria, frequency or hematuria.  No polyuria, polydipsia, polyphagia or blurred vision.   ED course: Initial vital signs were temperature 98.7 F, pulse 88, respiration 19, BP 160/119 mmHg O2 sat 94% on room air.  He received a 500 mL LR bolus and was started on an insulin infusion in the emergency department.  Lab work: His urinalysis was cloudy with glucosuria more than 500 mg deciliter, small hemoglobinuria, proteinuria more than 300 mg deciliter, large leukocyte esterase, more than 50 WBC and many bacteria on microscopic examination.  CBC showed a white count of 10.1, hemoglobin 10.6 g/dL platelets 285.  BMP showed a sodium 127 (corrected 141), potassium 3.7, chloride 97 and CO2 20 mmol/L with an anion  gap of 10.  Glucose 673, BUN 50, creatinine 3.45 and calcium 8.5 mg/dL.  Imaging: CT abdomen/pelvis without contrast show relative large amount of gas in the urinary bladder with apparent gas containing debris along the dependent mucosal surface of the bladder lumen.  No other specific GI findings.  Cholelithiasis.  Stable small cluster of tree-in-bud nodularity in the peripheral right lower lobe compatible with atypical infection sequela.  There is aortic atherosclerosis and emphysema.   Review of Systems: As mentioned in the history of present illness. All other systems reviewed and are negative. Past Medical History:  Diagnosis Date   Abscess of right foot    abscess/ulcer of R transtibial amputation requiring IV abx   Acute ST elevation myocardial infarction (STEMI) due to occlusion of circumflex coronary artery ([place]) 03/22/2020   AIDS ([place])    Anemia    CAD (coronary artery disease)    a. MI with stenting of OM1 in 11/2018 with residual disease. b. acute STEMI 03/2020 s/p DES to OM1   Chronic anemia    Chronic knee pain    right   Chronic pain    CKD (chronic kidney disease), stage IV (HCC)    CVA (cerebral vascular accident) ([place]) 04/2020   Diabetes type 2, uncontrolled    HgA1c 17.6 (04/27/2010)   Diabetic foot ulcer ([place]) 01/2017   right foot   Dilatation of aorta (HCC)    Erectile dysfunction    Genital warts    GERD (gastroesophageal reflux disease)    History of blood transfusion    HIV (human immunodeficiency virus infection) ([place]) 2009   CD4  count 100, VL 13800 (05/01/2010)   Hyperlipidemia    Hypertension    Myocardial infarction [facility])    Neuropathy    Noncompliance with medication regimen    Osteomyelitis ([place])    h/o hand   Osteomyelitis of metatarsal ([place]) 04/28/2017   Pneumonia    STEMI (ST elevation myocardial infarction) ([place]) 11/10/2018   Past Surgical History:  Procedure Laterality Date   AMPUTATION Right 05/02/2017   Procedure: AMPUTATION TRANSMETARSAL;   Surgeon: [name], MD;  Location: [place];  Service: Orthopedics;  Laterality: Right;   AMPUTATION Right 06/13/2017   Procedure: RIGHT BELOW KNEE AMPUTATION;  Surgeon: [name], MD;  Location: [place];  Service: Orthopedics;  Laterality: Right;   AMPUTATION Left 10/27/2020   Procedure: LEFT BELOW KNEE AMPUTATION;  Surgeon: [name], MD;  Location: [place];  Service: Orthopedics;  Laterality: Left;   AMPUTATION Left 11/17/2020   Procedure: REVISION AMPUTATION BELOW KNEE, LEFT;  Surgeon: [name], MD;  Location: [place];  Service: Orthopedics;  Laterality: Left;   AMPUTATION Left 01/03/2021   Procedure: LEFT ABOVE KNEE AMPUTATION;  Surgeon: [name], MD;  Location: [place];  Service: Orthopedics;  Laterality: Left;   APPLICATION OF WOUND VAC Left 10/27/2020   Procedure: APPLICATION OF WOUND VAC;  Surgeon: [name], MD;  Location: [place];  Service: Orthopedics;  Laterality: Left;   BELOW KNEE LEG AMPUTATION Right 06/13/2017   BELOW KNEE LEG AMPUTATION     CORONARY STENT INTERVENTION N/A 11/10/2018   Procedure: CORONARY STENT INTERVENTION;  Surgeon: Leonie Man, MD;  Location: [place] [facility];  Service: Cardiovascular;  Laterality: N/A;   CORONARY/GRAFT ACUTE MI REVASCULARIZATION N/A 03/21/2020   Procedure: Coronary/Graft Acute MI Revascularization;  Surgeon: [name], MD;  Location: [place] CV [facility];  Service: Cardiovascular;  Laterality: N/A;   CYSTOSCOPY N/A 11/09/2020   Procedure: CYSTOSCOPY, EVACUATION OF CLOTS, FULGERATION OF BLADDER AND BILATERIAL RETROGRADE PYELOGRAMS;  Surgeon: Janith Lima, MD;  Location: [place];  Service: Urology;  Laterality: N/A;   HERNIA REPAIR     I & D EXTREMITY Left 08/21/2014   Procedure: INCISION AND DRAINAGE LEFT SMALL FINGER;  Surgeon: Leanora Cover, MD;  Location: [place];  Service: Orthopedics;  Laterality: Left;   I & D EXTREMITY Right 03/18/2017   Procedure: IRRIGATION AND DEBRIDEMENT EXTREMITY;  Surgeon: [name], MD;   Location: [place];  Service: Orthopedics;  Laterality: Right;   IR FLUORO GUIDE CV LINE RIGHT  11/02/2020   IR REMOVAL TUN CV CATH W/O FL  11/13/2020   IR US GUIDE VASC ACCESS RIGHT  11/02/2020   LEFT HEART CATH AND CORONARY ANGIOGRAPHY N/A 11/10/2018   Procedure: LEFT HEART CATH AND CORONARY ANGIOGRAPHY;  Surgeon: Leonie Man, MD;  Location: [place] [facility];  Service: Cardiovascular;  Laterality: N/A;   LEFT HEART CATH AND CORONARY ANGIOGRAPHY N/A 03/21/2020   Procedure: LEFT HEART CATH AND CORONARY ANGIOGRAPHY;  Surgeon: [name], MD;  Location: [place] [facility];  Service: Cardiovascular;  Laterality: N/A;   MINOR IRRIGATION AND DEBRIDEMENT OF WOUND Right 04/22/2014   Procedure: IRRIGATION AND DEBRIDEMENT OF RIGHT NECK ABCESS;  Surgeon: Jerrell Belfast, MD;  Location: [place];  Service: ENT;  Laterality: Right;   MULTIPLE EXTRACTIONS WITH ALVEOLOPLASTY N/A 01/18/2013   Procedure: MULTIPLE EXTRACION 3, 6, 7, 10, 11, 13, 21, 22, 27, 28, 29, 30 WITH ALVEOLOPLASTY;  Surgeon: Gae Bon, DDS;  Location: [facility]  OR;  Service: Oral Surgery;  Laterality: N/A;   SKIN SPLIT GRAFT Right 03/21/2017   Procedure: IRRIGATION AND DEBRIDEMENT RIGHT FOOT AND APPLY SPLIT THICKNESS SKIN GRAFT AND WOUND VAC;  Surgeon: [name], MD;  Location: [place];  Service: Orthopedics;  Laterality: Right;   STUMP REVISION Left 12/02/2020   Procedure: REVISION LEFT BELOW KNEE AMPUTATION;  Surgeon: [name], MD;  Location: [place];  Service: Orthopedics;  Laterality: Left;   TEE WITHOUT CARDIOVERSION N/A 05/02/2017   Procedure: TRANSESOPHAGEAL ECHOCARDIOGRAM (TEE);  Surgeon: Acie Fredrickson Wonda Cheng, MD;  Location: [facility] OR;  Service: Cardiovascular;  Laterality: N/A;  coincidental to orthopedic case   Social History:  reports that he has never smoked. He has never used smokeless tobacco. He reports that he does not currently use alcohol. He reports that he does not use drugs.  Allergies  Allergen Reactions   Elemental  Sulfur Itching    Patient stated he's allergic to "sulfur" AND "sulfa"   Sulfa Antibiotics Itching    Family History  Problem Relation Age of Onset   Hypertension Mother    Arthritis Father    Hypertension Father    Hypertension Brother    Cancer Maternal Grandmother 76       unknown type of cancer   Depression Paternal Grandmother     Prior to Admission medications   Medication Sig Start Date End Date Taking? Authorizing Provider  allopurinol (ZYLOPRIM) 100 MG tablet Take 100 mg by mouth daily.    [provider]  amLODipine (NORVASC) 10 MG tablet Take 10 mg by mouth daily.    [provider]  aspirin EC 81 MG tablet Take 81 mg by mouth daily. Swallow whole.    [provider]  atorvastatin (LIPITOR) 80 MG tablet Take 1 tablet (80 mg total) by mouth daily. 09/01/20 01/02/22  [name], MD  carvedilol (COREG) 3.125 MG tablet Take 3.125 mg by mouth 2 (two) times daily with a meal.    [provider]  clopidogrel (PLAVIX) 75 MG tablet Take 1 tablet (75 mg total) by mouth daily. 11/15/21   Jettie Booze, MD  cyclobenzaprine (FLEXERIL) 10 MG tablet Take 10 mg by mouth daily as needed for muscle spasms. 09/27/21   [provider]  dapsone 100 MG tablet TAKE 1 TABLET (100 MG TOTAL) BY MOUTH DAILY. Patient not taking: Reported on 12/06/2021 02/13/21 02/13/22  Golden Circle, FNP  docusate sodium (COLACE) 100 MG capsule Take 1 capsule (100 mg total) by mouth 2 (two) times daily as needed for mild constipation (take when no BM for 1 day.). 12/15/21 12/15/22  [name], MD  dolutegravir-lamiVUDine (DOVATO) 50-300 MG tablet Take 1 tablet by mouth daily. 02/13/21   Golden Circle, FNP  doravirine (PIFELTRO) 100 MG TABS tablet Take 1 tablet (100 mg total) by mouth daily. 02/13/21   Golden Circle, FNP  escitalopram (LEXAPRO) 20 MG tablet Take 1 tablet (20 mg total) by mouth daily. 10/19/21   Hosie Poisson, MD  ezetimibe (ZETIA) 10 MG tablet Take  1 tablet (10 mg total) by mouth daily. 11/15/21   Jettie Booze, MD  ferrous sulfate 325 (65 FE) MG tablet Take 1 tablet (325 mg total) by mouth every Monday, Wednesday, and Friday at 6 PM. Do not take with other medicines. 10/19/21 11/18/21  Hosie Poisson, MD  finasteride (PROSCAR) 5 MG tablet Take 5 mg by mouth daily. 08/20/21   [provider]  gabapentin (NEURONTIN) 100 MG  capsule Take 1 capsule (100 mg total) by mouth 3 (three) times daily. Patient taking differently: Take 200 mg by mouth 2 (two) times daily. 10/19/21 12/18/21  Hosie Poisson, MD  hydrALAZINE (APRESOLINE) 10 MG tablet Take 10 mg by mouth daily.    [provider]  icosapent Ethyl (VASCEPA) 1 g capsule TAKE 2 CAPSULES (2 G TOTAL) BY MOUTH TWO TIMES DAILY. Patient taking differently: Take 2 g by mouth 2 (two) times daily. 03/27/20 12/06/21  [name], MD  insulin aspart (NOVOLOG) 100 UNIT/ML injection Inject 4 Units into the skin 3 (three) times daily with meals. 12/09/21   Ghimire, Henreitta Leber, MD  insulin aspart (NOVOLOG) 100 UNIT/ML injection CBG 70 - 120: 0 units  CBG 121 - 150: 2 units  CBG 151 - 200: 3 units  CBG 201 - 250: 5 units  CBG 251 - 300: 8 units  CBG 301 - 350: 11 units  CBG 351 - 400: 15 units 12/15/21   [name], MD  Lancets [facility] ULTRASOFT) lancets Use as instructed 02/16/20   [name], FNP  LANTUS 100 UNIT/ML injection Inject 0.28 mLs (28 Units total) into the skin at bedtime. 12/15/21   [name], MD  loperamide (IMODIUM) 2 MG capsule Take 1 capsule (2 mg total) by mouth 4 (four) times daily as needed (for more than 2 BM in 1 days). 12/15/21   [name], MD  mupirocin cream (BACTROBAN) 2 % Apply topically daily. Patient not taking: Reported on 12/06/2021 10/20/21   Hosie Poisson, MD  nitroGLYCERIN (NITROSTAT) 0.4 MG SL tablet Place 0.4 mg under the tongue every 5 (five) minutes as needed for chest pain.    [provider]  Oxycodone HCl 10 MG TABS  Take 10 mg by mouth every 4 (four) hours as needed (For pain). 10/31/21   [provider]  pantoprazole (PROTONIX) 40 MG tablet Take 1 tablet (40 mg total) by mouth daily. 10/19/21   Hosie Poisson, MD  sodium bicarbonate 650 MG tablet Take 1 tablet (650 mg total) by mouth 3 (three) times daily. 12/15/21   [name], MD    Physical Exam: Vitals:   12/18/21 1000 12/18/21 1116 12/18/21 1130 12/18/21 1217  BP: (!) 180/93  (!) 183/109 (!) 168/111  Pulse: 65  64 67  Resp: 11   12  Temp:  98.5 F (36.9 C)  98.3 F (36.8 C)  TempSrc:  Oral  Oral  SpO2: 99%  98% 100%   Physical Exam Vitals and nursing note reviewed.  Constitutional:      General: He is awake. He is not in acute distress.    Appearance: Normal appearance. He is obese. He is ill-appearing.  HENT:     Head: Normocephalic.     Mouth/Throat:     Mouth: Mucous membranes are dry.  Eyes:     General: No scleral icterus.    Pupils: Pupils are equal, round, and reactive to light.  Neck:     Vascular: No JVD.  Cardiovascular:     Rate and Rhythm: Normal rate and regular rhythm.     Heart sounds: S1 normal and S2 normal.  Pulmonary:     Effort: Pulmonary effort is normal.     Breath sounds: Normal breath sounds. No wheezing, rhonchi or rales.  Abdominal:     General: There is no distension.     Palpations: Abdomen is soft.     Tenderness: There is abdominal tenderness. There is  no guarding or rebound.  Musculoskeletal:     Cervical back: Neck supple.     Right lower leg: No edema.     Left lower leg: No edema.     Right Lower Extremity: Right leg is amputated below knee.     Left Lower Extremity: Left leg is amputated above knee.  Skin:    General: Skin is warm and dry.  Neurological:     General: No focal deficit present.     Mental Status: He is alert and oriented to person, place, and time.  Psychiatric:        Mood and Affect: Mood normal.        Behavior: Behavior is cooperative.   Data  Reviewed:  Results are pending, will review when available.  Assessment and Plan: Principal Problem:   Nonketotic hyperglycinemia, type II ([place]) Observation/stepdown. Kept n.p.o. earlier. Insulin infusion transition to SQ therapy. Monitor CBG closely. Monitor electrolytes and renal function. Consult diabetes coordinator. Carbohydrate modified diet. CBG monitoring before meals and bedtime.  Active Problems:   Diarrhea Secondary to EPEC. Continue supportive care.    HTN (hypertension) Continue amlodipine 10 mg p.o. daily. Continue carvedilol 3.125 mg p.o. twice daily. Continue hydralazine 10 mg p.o. twice daily. Continue lisinopril 10 mg p.o. daily. Continue losartan 50 mg p.o. daily. IV hydralazine as needed. Follow-up BP, HR, renal function electrolytes.    Hyperlipidemia Continue atorvastatin 80 mg p.o. daily.    HIV disease (HCC) Continue dapsone 100 mg daily. Continue with Dovato 1 tablet daily. Continue doravirine 100 mg tablet daily. Follow-up with infectious diseases as an outpatient.    Diabetic polyneuropathy associated with type 2 diabetes mellitus (HCC) Continue gabapentin 100 mg p.o. 3 times daily.    CKD (chronic kidney disease) stage 4, GFR 15-29 ml/min (HCC) Renal function around baseline.   Monitor BUN, creatinine and electrolytes.    PAD (peripheral artery disease) (HCC) Continue aspirin, atorvastatin and clopidogrel.    CAD (coronary artery disease) Continue aspirin, atorvastatin, carvedilol and clopidogrel.    BPH (benign prostatic hyperplasia) Continue finasteride. Follow-up with urology.    GERD (gastroesophageal reflux disease) Continue pantoprazole 40 mg p.o. daily.    Asymptomatic bacteriuria ID stewardship pharmacist recommended no antibiotics. Monitor for fever or symptoms.     Advance Care Planning:   Code Status: Full Code   Consults:   Family Communication:   Severity of Illness: The appropriate patient status for  this patient is INPATIENT. Inpatient status is judged to be reasonable and necessary in order to provide the required intensity of service to ensure the patient's safety. The patient's presenting symptoms, physical exam findings, and initial radiographic and laboratory data in the context of their chronic comorbidities is felt to place them at high risk for further clinical deterioration. Furthermore, it is not anticipated that the patient will be medically stable for discharge from the hospital within 2 midnights of admission.   * I certify that at the point of admission it is my clinical judgment that the patient will require inpatient hospital care spanning beyond 2 midnights from the point of admission due to high intensity of service, high risk for further deterioration and high frequency of surveillance required.*  Author: Reubin Milan, MD 12/18/2021 1:06 PM  For on call review [email or web address].   This document was prepared using Dragon voice recognition software and may contain some unintended transcription errors.

## 2021-12-18 NOTE — ED Provider Notes (Signed)
Patient signed to Dr. Waverly Ferrari pending results of work-up here.  Patient with persistent hyperglycemia despite being on insulin drip.  Also has evidence of UTI.  Patient started on IV antibiotics and will be admitted to the hospital service   Lacretia Leigh, MD 12/18/21 1032

## 2021-12-18 NOTE — ED Notes (Signed)
EDP made aware of pt's HTN

## 2021-12-18 NOTE — ED Notes (Signed)
Pt provided pitcher of water per request.

## 2021-12-18 NOTE — ED Notes (Signed)
Pt refused blood work at this time.

## 2021-12-18 NOTE — Plan of Care (Signed)
Discussed with patient plan of care for the evening, pain management and transitioning of the Endotool with some teach back displayed.  Problem: Education: Goal: Ability to describe self-care measures that may prevent or decrease complications (Diabetes Survival Skills Education) will improve Outcome: Progressing   Problem: Health Behavior/Discharge Planning: Goal: Ability to identify and utilize available resources and services will improve Outcome: Progressing

## 2021-12-18 NOTE — ED Notes (Signed)
Pt informed need for stool sample.

## 2021-12-18 NOTE — Inpatient Diabetes Management (Signed)
Inpatient Diabetes Program Recommendations  AACE/ADA: New Consensus Statement on Inpatient Glycemic Control (2015)  Target Ranges:  Prepandial:   less than 140 mg/dL      Peak postprandial:   less than 180 mg/dL (1-2 hours)      Critically ill patients:  140 - 180 mg/dL   Lab Results  Component Value Date   GLUCAP 153 (H) 12/18/2021   HGBA1C 11.5 (H) 12/08/2021    Review of Glycemic Control  Diabetes history: type 1 Outpatient Diabetes medications: Lantus 28 units daily, Novolog 0-15 units TID correction scale, Novolog 4 units TID Current orders for Inpatient glycemic control: IV insulin drip  Inpatient Diabetes Program Recommendations:   Received diabetes coordinator consult. Patient is very familiar to our inpatient diabetes team.   Recommend transitioning to Semglee 20 units daily, Novolog 0-15 units every 4 hours while NPO and then TID when eating, add Novolog 0-5 units HS scale. Titrate dosages as needed. Will need meal coverage when eating.   Will continue to monitor blood sugars while in the hospital.  [name] RN BSN CDE Diabetes Coordinator Pager: [phone]  8am-5pm

## 2021-12-18 NOTE — ED Provider Notes (Signed)
[facility] DEPT Provider Note   CSN: [id number] Arrival date & time: 12/17/21  2114     History  Chief Complaint  Patient presents with   Diarrhea    Keith [name] is a 56 y.o. male.  Patient presents to the emergency department for evaluation of persistent diarrhea.  Patient reports that his diarrhea has been ongoing for 3 weeks.  He has been hospitalized 2 times during that time span for elevated blood sugars but has not been told why he is having diarrhea.       Home Medications Prior to Admission medications   Medication Sig Start Date End Date Taking? Authorizing Provider  allopurinol (ZYLOPRIM) 100 MG tablet Take 100 mg by mouth daily.    [provider]  amLODipine (NORVASC) 10 MG tablet Take 10 mg by mouth daily.    [provider]  aspirin EC 81 MG tablet Take 81 mg by mouth daily. Swallow whole.    [provider]  atorvastatin (LIPITOR) 80 MG tablet Take 1 tablet (80 mg total) by mouth daily. 09/01/20 01/02/22  [name], MD  carvedilol (COREG) 3.125 MG tablet Take 3.125 mg by mouth 2 (two) times daily with a meal.    [provider]  clopidogrel (PLAVIX) 75 MG tablet Take 1 tablet (75 mg total) by mouth daily. 11/15/21   Jettie Booze, MD  cyclobenzaprine (FLEXERIL) 10 MG tablet Take 10 mg by mouth daily as needed for muscle spasms. 09/27/21   [provider]  dapsone 100 MG tablet TAKE 1 TABLET (100 MG TOTAL) BY MOUTH DAILY. Patient not taking: Reported on 12/06/2021 02/13/21 02/13/22  Golden Circle, FNP  docusate sodium (COLACE) 100 MG capsule Take 1 capsule (100 mg total) by mouth 2 (two) times daily as needed for mild constipation (take when no BM for 1 day.). 12/15/21 12/15/22  [name], MD  dolutegravir-lamiVUDine (DOVATO) 50-300 MG tablet Take 1 tablet by mouth daily. 02/13/21   Golden Circle, FNP  doravirine (PIFELTRO) 100 MG TABS tablet Take 1 tablet (100 mg total) by  mouth daily. 02/13/21   Golden Circle, FNP  escitalopram (LEXAPRO) 20 MG tablet Take 1 tablet (20 mg total) by mouth daily. 10/19/21   Hosie Poisson, MD  ezetimibe (ZETIA) 10 MG tablet Take 1 tablet (10 mg total) by mouth daily. 11/15/21   Jettie Booze, MD  ferrous sulfate 325 (65 FE) MG tablet Take 1 tablet (325 mg total) by mouth every Monday, Wednesday, and Friday at 6 PM. Do not take with other medicines. 10/19/21 11/18/21  Hosie Poisson, MD  finasteride (PROSCAR) 5 MG tablet Take 5 mg by mouth daily. 08/20/21   [provider]  gabapentin (NEURONTIN) 100 MG capsule Take 1 capsule (100 mg total) by mouth 3 (three) times daily. Patient taking differently: Take 200 mg by mouth 2 (two) times daily. 10/19/21 12/18/21  Hosie Poisson, MD  hydrALAZINE (APRESOLINE) 10 MG tablet Take 10 mg by mouth daily.    [provider]  icosapent Ethyl (VASCEPA) 1 g capsule TAKE 2 CAPSULES (2 G TOTAL) BY MOUTH TWO TIMES DAILY. Patient taking differently: Take 2 g by mouth 2 (two) times daily. 03/27/20 12/06/21  [name], MD  insulin aspart (NOVOLOG) 100 UNIT/ML injection Inject 4 Units into the skin 3 (three) times daily with meals. 12/09/21   Ghimire, Henreitta Leber, MD  insulin aspart (NOVOLOG) 100 UNIT/ML injection CBG 70 - 120: 0 units  CBG 121 -  150: 2 units  CBG 151 - 200: 3 units  CBG 201 - 250: 5 units  CBG 251 - 300: 8 units  CBG 301 - 350: 11 units  CBG 351 - 400: 15 units 12/15/21   [name], MD  Lancets [facility] ULTRASOFT) lancets Use as instructed 02/16/20   [name], FNP  LANTUS 100 UNIT/ML injection Inject 0.28 mLs (28 Units total) into the skin at bedtime. 12/15/21   [name], MD  loperamide (IMODIUM) 2 MG capsule Take 1 capsule (2 mg total) by mouth 4 (four) times daily as needed (for more than 2 BM in 1 days). 12/15/21   [name], MD  mupirocin cream (BACTROBAN) 2 % Apply topically daily. Patient not taking: Reported on 12/06/2021 10/20/21   Hosie Poisson, MD  nitroGLYCERIN (NITROSTAT) 0.4 MG SL tablet Place 0.4 mg under the tongue every 5 (five) minutes as needed for chest pain.    [provider]  Oxycodone HCl 10 MG TABS Take 10 mg by mouth every 4 (four) hours as needed (For pain). 10/31/21   [provider]  pantoprazole (PROTONIX) 40 MG tablet Take 1 tablet (40 mg total) by mouth daily. 10/19/21   Hosie Poisson, MD  sodium bicarbonate 650 MG tablet Take 1 tablet (650 mg total) by mouth 3 (three) times daily. 12/15/21   [name], MD      Allergies    Elemental sulfur and Sulfa antibiotics    Review of Systems   Review of Systems  Physical Exam Updated Vital Signs BP (!) 199/126   Pulse 74   Temp 98.8 F (37.1 C) (Oral)   Resp 16   SpO2 97%  Physical Exam Vitals and nursing note reviewed.  Constitutional:      General: He is not in acute distress.    Appearance: He is well-developed.  HENT:     Head: Normocephalic and atraumatic.     Mouth/Throat:     Mouth: Mucous membranes are moist.  Eyes:     General: Vision grossly intact. Gaze aligned appropriately.     Extraocular Movements: Extraocular movements intact.     Conjunctiva/sclera: Conjunctivae normal.  Cardiovascular:     Rate and Rhythm: Normal rate and regular rhythm.     Pulses: Normal pulses.     Heart sounds: Normal heart sounds, S1 normal and S2 normal. No murmur heard.    No friction rub. No gallop.  Pulmonary:     Effort: Pulmonary effort is normal. No respiratory distress.     Breath sounds: Normal breath sounds.  Abdominal:     Palpations: Abdomen is soft.     Tenderness: There is no abdominal tenderness. There is no guarding or rebound.     Hernia: No hernia is present.  Musculoskeletal:        General: No swelling.     Cervical back: Full passive range of motion without pain, normal range of motion and neck supple. No pain with movement, spinous process tenderness or muscular tenderness. Normal range of motion.     Right  lower leg: No edema.     Left lower leg: No edema.  Skin:    General: Skin is warm and dry.     Capillary Refill: Capillary refill takes less than 2 seconds.     Findings: No ecchymosis, erythema, lesion or wound.  Neurological:     Mental Status: He is alert and oriented to person, place, and time.  GCS: GCS eye subscore is 4. GCS verbal subscore is 5. GCS motor subscore is 6.     Cranial Nerves: Cranial nerves 2-12 are intact.     Sensory: Sensation is intact.     Motor: Motor function is intact. No weakness or abnormal muscle tone.     Coordination: Coordination is intact.  Psychiatric:        Mood and Affect: Mood normal.        Speech: Speech normal.        Behavior: Behavior normal.     ED Results / Procedures / Treatments   Labs (all labs ordered are listed, but only abnormal results are displayed) Labs Reviewed  CBC WITH DIFFERENTIAL/PLATELET - Abnormal; Notable for the following components:      Result Value   RBC 3.69 (*)    Hemoglobin 10.6 (*)    HCT 33.7 (*)    All other components within normal limits  BASIC METABOLIC PANEL - Abnormal; Notable for the following components:   Sodium 127 (*)    Chloride 97 (*)    CO2 20 (*)    Glucose, Bld 673 (*)    BUN 50 (*)    Creatinine, Ser 3.45 (*)    Calcium 8.5 (*)    GFR, Estimated 20 (*)    All other components within normal limits  URINALYSIS, ROUTINE W REFLEX MICROSCOPIC - Abnormal; Notable for the following components:   APPearance CLOUDY (*)    Glucose, UA >=500 (*)    Hgb urine dipstick SMALL (*)    Protein, ur >=300 (*)    Leukocytes,Ua LARGE (*)    WBC, UA >50 (*)    Bacteria, UA MANY (*)    All other components within normal limits  CBG MONITORING, ED - Abnormal; Notable for the following components:   Glucose-Capillary 535 (*)    All other components within normal limits  CBG MONITORING, ED - Abnormal; Notable for the following components:   Glucose-Capillary 517 (*)    All other components  within normal limits  GASTROINTESTINAL PANEL BY PCR, STOOL (REPLACES STOOL CULTURE)  C DIFFICILE QUICK SCREEN W PCR REFLEX    OSMOLALITY    EKG None  Radiology CT ABDOMEN PELVIS WO CONTRAST  Result Date: 12/18/2021 CLINICAL DATA:  Three-week history of diarrhea.  Abdominal pain. EXAM: CT ABDOMEN AND PELVIS WITHOUT CONTRAST TECHNIQUE: Multidetector CT imaging of the abdomen and pelvis was performed following the standard protocol without IV contrast. RADIATION DOSE REDUCTION: This exam was performed according to the departmental dose-optimization program which includes automated exposure control, adjustment of the mA and/or kV according to patient size and/or use of iterative reconstruction technique. COMPARISON:  11/10/2020 FINDINGS: Lower chest: Stable small cluster of tree-in-bud nodularity in the peripheral right lower lobe is compatible sequelae of atypical infection, potentially residual scarring. Hepatobiliary: No suspicious focal abnormality in the liver on this study without intravenous contrast. Calcified gallstones measure up to 15 mm diameter. No intrahepatic or extrahepatic biliary dilation. Pancreas: No focal mass lesion. No dilatation of the main duct. No intraparenchymal cyst. No peripancreatic edema. Spleen: No splenomegaly. No focal mass lesion. Adrenals/Urinary Tract: No adrenal nodule or mass. Kidneys unremarkable. No evidence for hydroureter. Relatively large volume gas is identified in the urinary bladder. There is apparent gas containing debris along the dependent mucosal surface of the bladder lumen (image 80/series 2). No substantial bladder wall thickening. Stomach/Bowel: Stomach is moderately distended with food and fluid. Duodenum is normally positioned as is the ligament  of Treitz. No small bowel wall thickening. No small bowel dilatation. The terminal ileum is normal. The appendix is normal. Small to moderate volume gas and stool identified in the colon. No colonic wall  thickening. No pericolonic edema or inflammation. Vascular/Lymphatic: There is mild atherosclerotic calcification of the abdominal aorta without aneurysm. There is no gastrohepatic or hepatoduodenal ligament lymphadenopathy. No retroperitoneal or mesenteric lymphadenopathy. No pelvic sidewall lymphadenopathy. Reproductive: The prostate gland and seminal vesicles are unremarkable. Other: No intraperitoneal free fluid. Musculoskeletal: No worrisome lytic or sclerotic osseous abnormality. IMPRESSION: 1. Relatively large volume gas in the urinary bladder with apparent gas containing debris along the dependent mucosal surface of the bladder lumen. Imaging features may be related to recent instrumentation, bladder infection is a consideration based on imaging. 2. No specific GI findings to explain the patient's history of diarrhea. 3. Cholelithiasis. 4. Stable small cluster of tree-in-bud nodularity in the peripheral right lower lobe is compatible with sequelae of atypical infection, potentially residual scarring. 5. Aortic Atherosclerosis (ICD10-I70.0) and Emphysema (ICD10-J43.9). Electronically Signed   By: Misty Stanley M.D.   On: 12/18/2021 05:17    Procedures Procedures    Medications Ordered in ED Medications  insulin regular, human (MYXREDLIN) 100 units/ 100 mL infusion (11.5 Units/hr Intravenous Rate/Dose Change 12/18/21 0740)  lactated ringers infusion ( Intravenous New Bag/Given 12/18/21 0628)  dextrose 5 % in lactated ringers infusion (0 mLs Intravenous Hold 12/18/21 0627)  dextrose 50 % solution 0-50 mL (has no administration in time range)  lactated ringers bolus 500 mL (500 mLs Intravenous New Bag/Given 12/18/21 4562)    ED Course/ Medical Decision Making/ A&P                           Medical Decision Making Amount and/or Complexity of Data Reviewed Labs: ordered. Radiology: ordered.  Risk Prescription drug management.   Presents to the emergency department because he has persistent  diarrhea.  Patient reports that this has been ongoing for 3 weeks.  I reviewed his records which revealed that he has been hospitalized twice in the last several weeks for hyperglycemia.  He mentioned the diarrhea during the hospitalizations but did not receive any imaging or medications to help with it.  Patient initially refused labs.  By the time he got back to the emergency department, it was 7 hours after he was triaged.  I had a conversation with him out the need for work-up and he did consent.  Patient found to be hyperglycemic once again.  Records seem to indicate that this is often secondary to noncompliance.  No evidence of DKA.  Patient placed on insulin drip to help bring down sugars.  If he gets into a normal range, would be a good candidate for discharge rather than hospitalization for his hyperglycemia.  CT scan performed to evaluate the diarrhea.  No cause of the diarrhea is identified but he does have air in his bladder.  Will need urinalysis to rule out urinary tract infection which could be driving up his blood sugars.  Patient will be signed out to oncoming ER physician to monitor improvement of his hyperglycemia and follow-up on urinalysis.        Final Clinical Impression(s) / ED Diagnoses Final diagnoses:  Hyperglycemia    Rx / DC Orders ED Discharge Orders     None         Fawn Desrocher, Gwenyth Allegra, MD 12/18/21 [phone]

## 2021-12-18 NOTE — ED Notes (Signed)
Pt transport to CT  

## 2021-12-19 DIAGNOSIS — E729 Disorder of amino-acid metabolism, unspecified: Secondary | ICD-10-CM | POA: Diagnosis not present

## 2021-12-19 LAB — COMPREHENSIVE METABOLIC PANEL
ALT: 11 U/L (ref 0–44)
AST: 12 U/L — ABNORMAL LOW (ref 15–41)
Albumin: 2.7 g/dL — ABNORMAL LOW (ref 3.5–5.0)
Alkaline Phosphatase: 86 U/L (ref 38–126)
Anion gap: 8 (ref 5–15)
BUN: 42 mg/dL — ABNORMAL HIGH (ref 6–20)
CO2: 20 mmol/L — ABNORMAL LOW (ref 22–32)
Calcium: 8.6 mg/dL — ABNORMAL LOW (ref 8.9–10.3)
Chloride: 108 mmol/L (ref 98–111)
Creatinine, Ser: 3.24 mg/dL — ABNORMAL HIGH (ref 0.61–1.24)
GFR, Estimated: 21 mL/min — ABNORMAL LOW (ref >60)
Glucose, Bld: 270 mg/dL — ABNORMAL HIGH (ref 70–99)
Potassium: 3.5 mmol/L (ref 3.5–5.1)
Sodium: 136 mmol/L (ref 135–145)
Total Bilirubin: 0.5 mg/dL (ref 0.3–1.2)
Total Protein: 6.8 g/dL (ref 6.5–8.1)

## 2021-12-19 LAB — GLUCOSE, CAPILLARY
Glucose-Capillary: 269 mg/dL — ABNORMAL HIGH (ref 70–99)
Glucose-Capillary: 298 mg/dL — ABNORMAL HIGH (ref 70–99)
Glucose-Capillary: 357 mg/dL — ABNORMAL HIGH (ref 70–99)
Glucose-Capillary: 279 mg/dL — ABNORMAL HIGH (ref 70–99)
Glucose-Capillary: 190 mg/dL — ABNORMAL HIGH (ref 70–99)

## 2021-12-19 LAB — CBC
HCT: 31.5 % — ABNORMAL LOW (ref 39.0–52.0)
Hemoglobin: 10.3 g/dL — ABNORMAL LOW (ref 13.0–17.0)
MCH: 29 pg (ref 26.0–34.0)
MCHC: 32.7 g/dL (ref 30.0–36.0)
MCV: 88.7 fL (ref 80.0–100.0)
Platelets: 284 10*3/uL (ref 150–400)
RBC: 3.55 MIL/uL — ABNORMAL LOW (ref 4.22–5.81)
RDW: 13.2 % (ref 11.5–15.5)
WBC: 11.9 10*3/uL — ABNORMAL HIGH (ref 4.0–10.5)
nRBC: 0 % (ref 0.0–0.2)

## 2021-12-19 MED ORDER — SODIUM CHLORIDE 0.9 % IV SOLN
1.0000 g | Freq: Two times a day (BID) | INTRAVENOUS | Status: AC
  Administered 2021-12-19: 1 g via INTRAVENOUS
  Filled 2021-12-19: qty 20

## 2021-12-19 MED ORDER — OXYCODONE HCL 5 MG PO TABS
10.0000 mg | ORAL_TABLET | ORAL | Status: DC
  Administered 2021-12-19 – 2021-12-20 (×6): 10 mg via ORAL
  Filled 2021-12-19 (×6): qty 2

## 2021-12-19 MED ORDER — INSULIN GLARGINE-YFGN 100 UNIT/ML ~~LOC~~ SOLN
12.0000 [IU] | Freq: Once | SUBCUTANEOUS | Status: AC
  Administered 2021-12-19: 12 [IU] via SUBCUTANEOUS
  Filled 2021-12-19: qty 0.12

## 2021-12-19 NOTE — Progress Notes (Signed)
PROGRESS NOTE   Keith [name]  ZOX:[id number] DOB: 08/28/1965 DOA: 12/17/2021 [name], NP  Brief Narrative:  56 year old black male known history DM TY 2 (recent W1X 91.4) complicated by neuropathy with right BKA and left AKA and prosthesis as well as hand osteomyelitis--chronic pain on gabapentin AIDS/genital warts follows with ID Dr. [name] CKD IV STEMI, CAD BMI 37  Multiple recent admissions with continued diarrhea DKA--found to have enteropathogenic E. coli on 9/11 [+] EPEC Work-up hyperosmolar nonketotic once again glucose 670 sodium 127 gap 10 WBC 10.1, platelet 283 CT ABD = large volume gas in urinary bladder along the bladder lumen tree-in-bud nodularity in peripheral right lower lobe Calcific gallstones up to 15 mm no obstruction  Hospital-Problem based course  EPEC --no current diarrhea Hold antibiotics supportive management  Hyperosmolar nonketotic state with hyperglycemia on admission glucose 670 recent A1c 11.2 Now controlled-CBGs still high in the 260-280 range  giving 28 units of Long-acting Continue moderate sliding scale and adjust Lantus dosing in the morning Continue gabapentin 100 twice daily  ?  UTI Klebsiella ESBL on cultures 8/31 Etiology could be secondary to recent instrumentation although denies recent Foley-we will treat as of actual infection but circumscribe the antibiotics to just 1 more dose of meropenem as he is not having any fever chills etc. Continue finasteride 5 mg  HIV Continue dapsone 100 daily, and other meds--outpatient follow-up Dr. [name]  HTN Continue amlodipine 10 Coreg 3.125 twice daily losartan 50 Continue hydralazine 10 twice daily Continue Zetia as outpatient-for now continue atorvastatin  Chronic pain Continue oxycodone 10 every 4 as per home regimen  Depression  continue Lexapro 20 daily  CAD Continue Plavix and meds as above   DVT prophylaxis: Heparin Code Status: Full Family Communication:  None Disposition:  Status is: Inpatient Remains inpatient appropriate because:   Monitor glycemic control likely discharge in a.m. after 1 more dose of meropenem   Consultants:  None  Procedures: No  Antimicrobials: Meropenem   Subjective: Awake coherent no fever no chills no diarrhea no flank pain no dysuria no nausea no vomiting  Objective: Vitals:   12/19/21 0400 12/19/21 0414 12/19/21 0500 12/19/21 0600  BP: 137/63  (!) 146/88 (!) 159/63  Pulse: 75 77 81 77  Resp: 12 16 20 15   Temp:  98.2 F (36.8 C)    TempSrc:  Oral    SpO2: 100% 98% 99% 99%  Weight:      Height:        Intake/Output Summary (Last 24 hours) at 12/19/2021 0640 Last data filed at 12/19/2021 0529 Gross per 24 hour  Intake 4270.46 ml  Output 1200 ml  Net 3070.46 ml   Filed Weights   12/18/21 2200  Weight: 124.3 kg    Examination:  EOMI NCAT thick neck Mallampati 4 no icterus no pallor no submandibular lymphadenopathy no icterus no pallor Chest is clear no rales rhonchi wheeze ROM is intact moving 4 limbs equally Abdomen is soft no rebound no guardin S1-S2 no murmur no rub no gallopg   Data Reviewed: personally reviewed   CBC    Component Value Date/Time   WBC 11.9 (H) 12/19/2021 0259   RBC 3.55 (L) 12/19/2021 0259   HGB 10.3 (L) 12/19/2021 0259   HGB 12.1 (L) 09/07/2018 1056   HCT 31.5 (L) 12/19/2021 0259   HCT 37.5 09/07/2018 1056   PLT 284 12/19/2021 0259   PLT 252 09/07/2018 1056   MCV 88.7 12/19/2021 0259   MCV 82 09/07/2018  1056   MCH 29.0 12/19/2021 0259   MCHC 32.7 12/19/2021 0259   RDW 13.2 12/19/2021 0259   RDW 12.6 09/07/2018 1056   LYMPHSABS 1.5 12/18/2021 0441   LYMPHSABS 1.4 09/07/2018 1056   MONOABS 0.7 12/18/2021 0441   EOSABS 0.2 12/18/2021 0441   EOSABS 0.1 09/07/2018 1056   BASOSABS 0.0 12/18/2021 0441   BASOSABS 0.0 09/07/2018 1056      Latest Ref Rng & Units 12/19/2021    2:59 AM 12/18/2021    4:41 AM 12/15/2021    6:38 AM  CMP  Glucose 70 - 99 mg/dL  270  673  312   BUN 6 - 20 mg/dL 42  50  42   Creatinine 0.61 - 1.24 mg/dL 3.24  3.45  3.31   Sodium 135 - 145 mmol/L 136  127  134   Potassium 3.5 - 5.1 mmol/L 3.5  3.7  3.5   Chloride 98 - 111 mmol/L 108  97  108   CO2 22 - 32 mmol/L 20  20  18    Calcium 8.9 - 10.3 mg/dL 8.6  8.5  8.3   Total Protein 6.5 - 8.1 g/dL 6.8     Total Bilirubin 0.3 - 1.2 mg/dL 0.5     Alkaline Phos 38 - 126 U/L 86     AST 15 - 41 U/L 12     ALT 0 - 44 U/L 11        Radiology Studies: CT ABDOMEN PELVIS WO CONTRAST  Result Date: 12/18/2021 CLINICAL DATA:  Three-week history of diarrhea.  Abdominal pain. EXAM: CT ABDOMEN AND PELVIS WITHOUT CONTRAST TECHNIQUE: Multidetector CT imaging of the abdomen and pelvis was performed following the standard protocol without IV contrast. RADIATION DOSE REDUCTION: This exam was performed according to the departmental dose-optimization program which includes automated exposure control, adjustment of the mA and/or kV according to patient size and/or use of iterative reconstruction technique. COMPARISON:  11/10/2020 FINDINGS: Lower chest: Stable small cluster of tree-in-bud nodularity in the peripheral right lower lobe is compatible sequelae of atypical infection, potentially residual scarring. Hepatobiliary: No suspicious focal abnormality in the liver on this study without intravenous contrast. Calcified gallstones measure up to 15 mm diameter. No intrahepatic or extrahepatic biliary dilation. Pancreas: No focal mass lesion. No dilatation of the main duct. No intraparenchymal cyst. No peripancreatic edema. Spleen: No splenomegaly. No focal mass lesion. Adrenals/Urinary Tract: No adrenal nodule or mass. Kidneys unremarkable. No evidence for hydroureter. Relatively large volume gas is identified in the urinary bladder. There is apparent gas containing debris along the dependent mucosal surface of the bladder lumen (image 80/series 2). No substantial bladder wall thickening.  Stomach/Bowel: Stomach is moderately distended with food and fluid. Duodenum is normally positioned as is the ligament of Treitz. No small bowel wall thickening. No small bowel dilatation. The terminal ileum is normal. The appendix is normal. Small to moderate volume gas and stool identified in the colon. No colonic wall thickening. No pericolonic edema or inflammation. Vascular/Lymphatic: There is mild atherosclerotic calcification of the abdominal aorta without aneurysm. There is no gastrohepatic or hepatoduodenal ligament lymphadenopathy. No retroperitoneal or mesenteric lymphadenopathy. No pelvic sidewall lymphadenopathy. Reproductive: The prostate gland and seminal vesicles are unremarkable. Other: No intraperitoneal free fluid. Musculoskeletal: No worrisome lytic or sclerotic osseous abnormality. IMPRESSION: 1. Relatively large volume gas in the urinary bladder with apparent gas containing debris along the dependent mucosal surface of the bladder lumen. Imaging features may be related to recent instrumentation, bladder infection is  a consideration based on imaging. 2. No specific GI findings to explain the patient's history of diarrhea. 3. Cholelithiasis. 4. Stable small cluster of tree-in-bud nodularity in the peripheral right lower lobe is compatible with sequelae of atypical infection, potentially residual scarring. 5. Aortic Atherosclerosis (ICD10-I70.0) and Emphysema (ICD10-J43.9). Electronically Signed   By: Misty Stanley M.D.   On: 12/18/2021 05:17     Scheduled Meds:  amLODipine  10 mg Oral Daily   aspirin EC  81 mg Oral Daily   atorvastatin  80 mg Oral Daily   carvedilol  3.125 mg Oral BID WC   Chlorhexidine Gluconate Cloth  6 each Topical Daily   clopidogrel  75 mg Oral Daily   dapsone  100 mg Oral Daily   dolutegravir  50 mg Oral Daily   And   lamiVUDine  300 mg Oral Daily   doravirine  100 mg Oral Daily   escitalopram  20 mg Oral Daily   finasteride  5 mg Oral Daily   gabapentin   100 mg Oral BID   heparin injection (subcutaneous)  5,000 Units Subcutaneous Q8H   hydrALAZINE  10 mg Oral BID   insulin aspart  0-15 Units Subcutaneous TID WC   insulin glargine-yfgn  28 Units Subcutaneous QHS   losartan  50 mg Oral Daily   pantoprazole  40 mg Oral Daily   sodium bicarbonate  650 mg Oral BID   Continuous Infusions:  dextrose 5% lactated ringers Stopped (12/19/21 0019)   insulin Stopped (12/18/21 2315)   lactated ringers Stopped (12/19/21 0514)   meropenem (MERREM) IV Stopped (12/18/21 2251)     LOS: 1 day   Time spent: 48  Nita Sells, MD [facility] To contact the attending provider between 7A-7P or the covering provider during after hours 7P-7A, please log into the web site [email or web address] and access using universal [place] password for that web site. If you do not have the password, please call the hospital operator.  12/19/2021, 6:40 AM

## 2021-12-19 NOTE — Inpatient Diabetes Management (Signed)
Inpatient Diabetes Program Recommendations  AACE/ADA: New Consensus Statement on Inpatient Glycemic Control (2015)  Target Ranges:  Prepandial:   less than 140 mg/dL      Peak postprandial:   less than 180 mg/dL (1-2 hours)      Critically ill patients:  140 - 180 mg/dL   Lab Results  Component Value Date   GLUCAP 298 (H) 12/19/2021   HGBA1C 11.5 (H) 12/08/2021    Review of Glycemic Control  Latest Reference Range & Units 12/19/21 04:08 12/19/21 07:47  Glucose-Capillary 70 - 99 mg/dL 269 (H) 298 (H)  (H): Data is abnormally high    Diabetes history: type 1 Outpatient Diabetes medications: Lantus 28 units daily, Novolog 0-15 units TID correction scale, Novolog 4 units TID Current orders for Inpatient glycemic control: IV insulin drip  Inpatient Diabetes Program Recommendations:    Please consider:  Semglee 34 units QHS Novolog 0-5 units QHS Novolog 4 units TID with meals if consumes at least 50%  Will continue to follow while inpatient.  Thank you, Reche Dixon, MSN, [place] Diabetes Coordinator Inpatient Diabetes Program [phone] (team pager from 8a-5p)

## 2021-12-20 DIAGNOSIS — E729 Disorder of amino-acid metabolism, unspecified: Secondary | ICD-10-CM | POA: Diagnosis not present

## 2021-12-20 LAB — BASIC METABOLIC PANEL
Anion gap: 6 (ref 5–15)
BUN: 43 mg/dL — ABNORMAL HIGH (ref 6–20)
CO2: 21 mmol/L — ABNORMAL LOW (ref 22–32)
Calcium: 8.5 mg/dL — ABNORMAL LOW (ref 8.9–10.3)
Chloride: 107 mmol/L (ref 98–111)
Creatinine, Ser: 3.77 mg/dL — ABNORMAL HIGH (ref 0.61–1.24)
GFR, Estimated: 18 mL/min — ABNORMAL LOW (ref >60)
Glucose, Bld: 176 mg/dL — ABNORMAL HIGH (ref 70–99)
Potassium: 3.5 mmol/L (ref 3.5–5.1)
Sodium: 134 mmol/L — ABNORMAL LOW (ref 135–145)

## 2021-12-20 LAB — CBC WITH DIFFERENTIAL/PLATELET
Abs Immature Granulocytes: 0.06 10*3/uL (ref 0.00–0.07)
Basophils Absolute: 0.1 10*3/uL (ref 0.0–0.1)
Basophils Relative: 0 %
Eosinophils Absolute: 0.3 10*3/uL (ref 0.0–0.5)
Eosinophils Relative: 2 %
HCT: 31.2 % — ABNORMAL LOW (ref 39.0–52.0)
Hemoglobin: 10.1 g/dL — ABNORMAL LOW (ref 13.0–17.0)
Immature Granulocytes: 0 %
Lymphocytes Relative: 10 %
Lymphs Abs: 1.5 10*3/uL (ref 0.7–4.0)
MCH: 29.4 pg (ref 26.0–34.0)
MCHC: 32.4 g/dL (ref 30.0–36.0)
MCV: 90.7 fL (ref 80.0–100.0)
Monocytes Absolute: 0.8 10*3/uL (ref 0.1–1.0)
Monocytes Relative: 5 %
Neutro Abs: 12.2 10*3/uL — ABNORMAL HIGH (ref 1.7–7.7)
Neutrophils Relative %: 83 %
Platelets: 287 10*3/uL (ref 150–400)
RBC: 3.44 MIL/uL — ABNORMAL LOW (ref 4.22–5.81)
RDW: 13.8 % (ref 11.5–15.5)
WBC: 14.9 10*3/uL — ABNORMAL HIGH (ref 4.0–10.5)
nRBC: 0 % (ref 0.0–0.2)

## 2021-12-20 LAB — GLUCOSE, CAPILLARY: Glucose-Capillary: 137 mg/dL — ABNORMAL HIGH (ref 70–99)

## 2021-12-20 MED ORDER — LANTUS 100 UNIT/ML ~~LOC~~ SOLN: 36.0000 [IU] | Freq: Every day | SUBCUTANEOUS | Status: DC

## 2021-12-20 NOTE — Discharge Summary (Signed)
Physician Discharge Summary  Keith [name] EUM:[id number] DOB: 1966-03-02 DOA: 12/17/2021  [name], NP  Admit date: 12/17/2021 Discharge date: 12/20/2021  Time spent: 33 minutes  Recommendations for Outpatient Follow-up:  Please ensure has a record of glycemic control-seems to be somewhat noncompliant at times Should be seen by ID clinic please refer him to them if he has not been seen recently-we will probably need discussion about haart meds   Discharge Diagnoses:  MAIN problem for hospitalization   diabetic ketoacidosis on admission Klebsiella colonization Prior EPEC HTN Chronic pain Moderately controlled HIV    Please see below for itemized issues addressed in [facility]- refer to other progress notes for clarity if needed  Discharge Condition: Improved  Diet recommendation: Diabetic  Filed Weights   12/18/21 2200  Weight: 124.3 kg    History of present illness:  56 year old black male known history DM TY 2 (recent Keith [name] 08.6) complicated by neuropathy with right BKA and left AKA and prosthesis as well as hand osteomyelitis--chronic pain on gabapentin AIDS/genital warts follows with ID Dr. [name] CKD IV STEMI, CAD BMI 37   Multiple recent admissions with continued diarrhea DKA--found to have enteropathogenic E. coli on 9/11 [+] EPEC Work-up hyperosmolar nonketotic once again glucose 670 sodium 127 gap 10 WBC 10.1, platelet 283 CT ABD = large volume gas in urinary bladder along the bladder lumen tree-in-bud nodularity in peripheral right lower lobe Calcific gallstones up to 15 mm no obstruction    Hospital Course:  EPEC --no current diarrhea during the hospital stay therefore we discontinued precautions Hold antibiotics supportive management   Hyperosmolar nonketotic state with hyperglycemia on admission glucose 670 recent A1c 11.2 Now controlled-CBGs were elevated and we made some changes to his meds during hospital stay Increase this admission his  Lantus to 36 units-he will also need sliding scale coverage as per home and needs frequent rechecks and probably supervision of his glucose log Continue gabapentin 100 twice daily   ?  UTI Klebsiella ESBL on cultures 8/31 Etiology could be secondary to recent instrumentation although denies recent Foley-we will treat as of actual infection but circumscribe the antibiotics to just 1 more dose of meropenem as he is not having any fever chills etc. Continue finasteride 5 mg   HIV Continue dapsone 100 daily, and other meds--outpatient follow-up Dr. [name]   HTN Continue amlodipine 10 Coreg 3.125 twice daily losartan 50 Continue hydralazine 10 twice daily Resumed his area, atorvastatin    Chronic pain Continue oxycodone 10 every 4 as per home regimen   Depression  continue Lexapro 20 daily   CAD Continue Plavix and meds as above    Discharge Exam: Vitals:   12/20/21 0400 12/20/21 0500  BP: (!) 147/80   Pulse: 79 79  Resp: 13 16  Temp: 98.6 F (37 C)   SpO2: 94% 94%    Subj on day of d/c   Awake alert no distress feels 100% better he says eating drinking No pain no diarrhea no chills no fever no burning in the urine  General Exam on discharge  EOMI NCAT no focal deficit no icterus no pallor ROM intact moving 4 limbs equally No amputations on lower extremities Abdomen obese nontender no rebound no guarding Neurologically intact otherwise Thick neck Mallampati 4 No icterus no pallor  Discharge Instructions   Discharge Instructions     Diet - low sodium heart healthy   Complete by: As directed    Discharge instructions   Complete by: As  directed    You were diagnosed this hospital stay with diabetic ketoacidosis which is life-threatening condition related to uncontrolled blood sugar-we have increased your Lantus dose to 36 units at night please ensure that you take it at this dose please make sure that you use your monitor and check your sugars regularly We also  thought you might of had an infection in your bladder but it did not seem that this was an actual issue-you should follow-up with your regular physician in about a week's time and get lab work You will notice that some of your medications have changed and you should ask your doctor in the outpatient setting about these things For high fevers chills nausea vomiting or severe diarrhea please present to get some care   Increase activity slowly   Complete by: As directed    No wound care   Complete by: As directed       Allergies as of 12/20/2021       Reactions   Elemental Sulfur Itching   Patient stated he's allergic to "sulfur" AND "sulfa"   Metformin And Related Other (See Comments)   Stopped by MD- affected kidneys   Zetia [ezetimibe] Nausea Only   Sulfa Antibiotics Itching        Medication List     STOP taking these medications    ezetimibe 10 MG tablet Commonly known as: ZETIA   lisinopril 10 MG tablet Commonly known as: ZESTRIL   loperamide 2 MG capsule Commonly known as: IMODIUM   mupirocin cream 2 % Commonly known as: BACTROBAN   pantoprazole 40 MG tablet Commonly known as: PROTONIX   Vascepa 1 g capsule Generic drug: icosapent Ethyl       TAKE these medications    allopurinol 100 MG tablet Commonly known as: ZYLOPRIM Take 100 mg by mouth daily.   amLODipine 10 MG tablet Commonly known as: NORVASC Take 10 mg by mouth daily.   aspirin EC 81 MG tablet Take 81 mg by mouth daily. Swallow whole.   atorvastatin 80 MG tablet Commonly known as: LIPITOR Take 1 tablet (80 mg total) by mouth daily.   carvedilol 3.125 MG tablet Commonly known as: COREG Take 3.125 mg by mouth 2 (two) times daily with a meal.   clopidogrel 75 MG tablet Commonly known as: PLAVIX Take 1 tablet (75 mg total) by mouth daily.   cyclobenzaprine 10 MG tablet Commonly known as: FLEXERIL Take 10 mg by mouth daily as needed for muscle spasms.   dapsone 100 MG tablet TAKE 1  TABLET (100 MG TOTAL) BY MOUTH DAILY. What changed: how much to take   docusate sodium 100 MG capsule Commonly known as: Colace Take 1 capsule (100 mg total) by mouth 2 (two) times daily as needed for mild constipation (take when no BM for 1 day.).   doravirine 100 MG Tabs tablet Commonly known as: PIFELTRO Take 1 tablet (100 mg total) by mouth daily.   Dovato 50-300 MG tablet Generic drug: dolutegravir-lamiVUDine Take 1 tablet by mouth daily.   escitalopram 20 MG tablet Commonly known as: LEXAPRO Take 1 tablet (20 mg total) by mouth daily.   ferrous sulfate 325 (65 FE) MG tablet Take 1 tablet (325 mg total) by mouth every Monday, Wednesday, and Friday at 6 PM. Do not take with other medicines.   finasteride 5 MG tablet Commonly known as: PROSCAR Take 5 mg by mouth daily.   gabapentin 100 MG capsule Commonly known as: NEURONTIN Take 1 capsule (100  mg total) by mouth 3 (three) times daily. What changed: when to take this   HumaLOG KwikPen 100 UNIT/ML KwikPen Generic drug: insulin lispro Inject 0-15 Units into the skin See admin instructions. Inject 0-15 units into the skin three times a day with meals, per sliding scale: CBG 70 - 120: 0 units; CBG 121 - 150: 2 units; CBG 151 - 200: 3 units; CBG 201 - 250: 5 units; CBG 251 - 300: 8 units; CBG 301 - 350: 11 units; CBG 351 - 400: 15 units   hydrALAZINE 10 MG tablet Commonly known as: APRESOLINE Take 10 mg by mouth 2 (two) times daily.   insulin aspart 100 UNIT/ML injection Commonly known as: novoLOG Inject 4 Units into the skin 3 (three) times daily with meals. What changed: Another medication with the same name was removed. Continue taking this medication, and follow the directions you see here.   Lantus 100 UNIT/ML injection Generic drug: insulin glargine Inject 0.36 mLs (36 Units total) into the skin at bedtime. What changed: how much to take   losartan 50 MG tablet Commonly known as: COZAAR Take 50 mg by mouth  daily.   nitroGLYCERIN 0.4 MG SL tablet Commonly known as: NITROSTAT Place 0.4 mg under the tongue every 5 (five) minutes as needed for chest pain.   onetouch ultrasoft lancets Use as instructed   Oxycodone HCl 10 MG Tabs Take 10 mg by mouth every 4 (four) hours.   sodium bicarbonate 650 MG tablet Take 1 tablet (650 mg total) by mouth 3 (three) times daily. What changed: when to take this       Allergies  Allergen Reactions   Elemental Sulfur Itching    Patient stated he's allergic to "sulfur" AND "sulfa"   Metformin And Related Other (See Comments)    Stopped by MD- affected kidneys   Zetia [Ezetimibe] Nausea Only   Sulfa Antibiotics Itching      The results of significant diagnostics from this hospitalization (including imaging, microbiology, ancillary and laboratory) are listed below for reference.    Significant Diagnostic Studies: CT ABDOMEN PELVIS WO CONTRAST  Result Date: 12/18/2021 CLINICAL DATA:  Three-week history of diarrhea.  Abdominal pain. EXAM: CT ABDOMEN AND PELVIS WITHOUT CONTRAST TECHNIQUE: Multidetector CT imaging of the abdomen and pelvis was performed following the standard protocol without IV contrast. RADIATION DOSE REDUCTION: This exam was performed according to the departmental dose-optimization program which includes automated exposure control, adjustment of the mA and/or kV according to patient size and/or use of iterative reconstruction technique. COMPARISON:  11/10/2020 FINDINGS: Lower chest: Stable small cluster of tree-in-bud nodularity in the peripheral right lower lobe is compatible sequelae of atypical infection, potentially residual scarring. Hepatobiliary: No suspicious focal abnormality in the liver on this study without intravenous contrast. Calcified gallstones measure up to 15 mm diameter. No intrahepatic or extrahepatic biliary dilation. Pancreas: No focal mass lesion. No dilatation of the main duct. No intraparenchymal cyst. No  peripancreatic edema. Spleen: No splenomegaly. No focal mass lesion. Adrenals/Urinary Tract: No adrenal nodule or mass. Kidneys unremarkable. No evidence for hydroureter. Relatively large volume gas is identified in the urinary bladder. There is apparent gas containing debris along the dependent mucosal surface of the bladder lumen (image 80/series 2). No substantial bladder wall thickening. Stomach/Bowel: Stomach is moderately distended with food and fluid. Duodenum is normally positioned as is the ligament of Treitz. No small bowel wall thickening. No small bowel dilatation. The terminal ileum is normal. The appendix is normal. Small to moderate  volume gas and stool identified in the colon. No colonic wall thickening. No pericolonic edema or inflammation. Vascular/Lymphatic: There is mild atherosclerotic calcification of the abdominal aorta without aneurysm. There is no gastrohepatic or hepatoduodenal ligament lymphadenopathy. No retroperitoneal or mesenteric lymphadenopathy. No pelvic sidewall lymphadenopathy. Reproductive: The prostate gland and seminal vesicles are unremarkable. Other: No intraperitoneal free fluid. Musculoskeletal: No worrisome lytic or sclerotic osseous abnormality. IMPRESSION: 1. Relatively large volume gas in the urinary bladder with apparent gas containing debris along the dependent mucosal surface of the bladder lumen. Imaging features may be related to recent instrumentation, bladder infection is a consideration based on imaging. 2. No specific GI findings to explain the patient's history of diarrhea. 3. Cholelithiasis. 4. Stable small cluster of tree-in-bud nodularity in the peripheral right lower lobe is compatible with sequelae of atypical infection, potentially residual scarring. 5. Aortic Atherosclerosis (ICD10-I70.0) and Emphysema (ICD10-J43.9). Electronically Signed   By: Misty Stanley M.D.   On: 12/18/2021 05:17   DG Abd 1 View  Result Date: 12/07/2021 CLINICAL DATA:  A  56 year old male presents with nausea and vomiting. EXAM: ABDOMEN - 1 VIEW COMPARISON:  December of 2017. FINDINGS: Incidental imaging of the lung bases is unremarkable. Moderate stool and gas in the rectum. Scattered gas-filled loops of colon also with mild colonic stool in the area of the ascending colon. Due to patient body habitus a portion of the RIGHT flank is excluded. There is scattered gas-filled loops of small bowel as well but without substantial distension. On limited assessment there is no acute skeletal process IMPRESSION: Query mild constipation. Scattered gas-filled loops of small bowel may reflect mild ileus. No signs of obstruction. RIGHT flank partially excluded from view on today's evaluation. Correlate with any localizing symptoms in this area or signs of hernia. Could consider repeat imaging as warranted. Electronically Signed   By: Zetta Bills M.D.   On: 12/07/2021 09:52    Microbiology: Recent Results (from the past 240 hour(s))  Gastrointestinal Panel by PCR , Stool     Status: Abnormal   Collection Time: 12/17/21 10:12 PM   Specimen: Stool  Result Value Ref Range Status   Campylobacter species NOT DETECTED NOT DETECTED Final   Plesimonas shigelloides NOT DETECTED NOT DETECTED Final   Salmonella species NOT DETECTED NOT DETECTED Final   Yersinia enterocolitica NOT DETECTED NOT DETECTED Final   Vibrio species NOT DETECTED NOT DETECTED Final   Vibrio cholerae NOT DETECTED NOT DETECTED Final   Enteroaggregative E coli (EAEC) NOT DETECTED NOT DETECTED Final   Enteropathogenic E coli (EPEC) DETECTED (A) NOT DETECTED Final    Comment: RESULT CALLED TO, READ BACK BY AND VERIFIED WITH: CHRISTINA BAYNES RN [id number] 12/18/21 HNM    Enterotoxigenic E coli (ETEC) NOT DETECTED NOT DETECTED Final   Shiga like toxin producing E coli (STEC) NOT DETECTED NOT DETECTED Final   Shigella/Enteroinvasive E coli (EIEC) NOT DETECTED NOT DETECTED Final   Cryptosporidium NOT DETECTED NOT DETECTED  Final   Cyclospora cayetanensis NOT DETECTED NOT DETECTED Final   Entamoeba histolytica NOT DETECTED NOT DETECTED Final   Giardia lamblia NOT DETECTED NOT DETECTED Final   Adenovirus F40/41 NOT DETECTED NOT DETECTED Final   Astrovirus NOT DETECTED NOT DETECTED Final   Norovirus GI/GII NOT DETECTED NOT DETECTED Final   Rotavirus A NOT DETECTED NOT DETECTED Final   Sapovirus (I, II, IV, and V) NOT DETECTED NOT DETECTED Final    Comment: Performed at [facility], [address] [place], [place] [address]  MRSA Next Gen by PCR, Nasal     Status: None   Collection Time: 12/18/21  8:31 PM   Specimen: Nasal Mucosa; Nasal Swab  Result Value Ref Range Status   MRSA by PCR Next Gen NOT DETECTED NOT DETECTED Final    Comment: (NOTE) The GeneXpert MRSA Assay (FDA approved for NASAL specimens only), is one component of a comprehensive MRSA colonization surveillance program. It is not intended to diagnose MRSA infection nor to guide or monitor treatment for MRSA infections. Test performance is not FDA approved in patients less than 41 years old. Performed at [facility], [place] [address], [place], [place] [address]      Labs: Basic Metabolic Panel: Recent Labs  Lab 12/14/21 2205 12/15/21 8657 12/18/21 0441 12/19/21 0259 12/20/21 0315  NA 132* 134* 127* 136 134*  K 3.5 3.5 3.7 3.5 3.5  CL 103 108 97* 108 107  CO2 19* 18* 20* 20* 21*  GLUCOSE 405* 312* 673* 270* 176*  BUN 46* 42* 50* 42* 43*  CREATININE 3.55* 3.31* 3.45* 3.24* 3.77*  CALCIUM 8.4* 8.3* 8.5* 8.6* 8.5*   Liver Function Tests: Recent Labs  Lab 12/14/21 0814 12/19/21 0259  AST 14* 12*  ALT 15 11  ALKPHOS 116 86  BILITOT 0.6 0.5  PROT 7.5 6.8  ALBUMIN 2.9* 2.7*   Recent Labs  Lab 12/14/21 0814  LIPASE 38   No results for input(s): "AMMONIA" in the last 168 hours. CBC: Recent Labs  Lab 12/14/21 0814 12/14/21 1439 12/15/21 0638 12/18/21 0441 12/19/21 0259 12/20/21 0315  WBC  8.8  --  8.4 10.1 11.9* 14.9*  NEUTROABS  --   --   --  7.6  --  12.2*  HGB 11.7* 11.6* 11.0* 10.6* 10.3* 10.1*  HCT 38.4* 34.0* 32.5* 33.7* 31.5* 31.2*  MCV 92.8  --  87.1 91.3 88.7 90.7  PLT 257  --  261 285 284 287   Cardiac Enzymes: No results for input(s): "CKTOTAL", "CKMB", "CKMBINDEX", "TROPONINI" in the last 168 hours. BNP: BNP (last 3 results) No results for input(s): "BNP" in the last 8760 hours.  ProBNP (last 3 results) No results for input(s): "PROBNP" in the last 8760 hours.  CBG: Recent Labs  Lab 12/19/21 0408 12/19/21 0747 12/19/21 1221 12/19/21 1652 12/19/21 2110  GLUCAP 269* 298* 357* 279* 190*       Signed:  Nita Sells MD   [facility] 12/20/2021, 8:06 AM

## 2021-12-20 NOTE — Evaluation (Signed)
Physical Therapy Evaluation Patient Details Name: Keith [name] MRN: [id number] DOB: June 05, 1965 Today's Date: 12/20/2021  History of Present Illness  56 year old black male known history DM TY 2 (recent Q7H 41.9) complicated by neuropathy with right BKA and left AKA and prosthesis as well as hand osteomyelitis--chronic pain on gabapentin  AIDS/genital warts follows with ID Dr. [name]  CKD IV  STEMI, CAD  BMI 37     Multiple recent admissions with continued diarrhea DKA--found to have enteropathogenic E. coli on 9/11 (+) EPEC  Work-up hyperosmolar nonketotic  Clinical Impression  Patient  admitted for above, recently at [facility] for same. Patient seated on bed edge, having difficulty with application of R prosthetic. Repositioned the   sleeve/shuttle lock several times until aligned to  place prosthetic in and lock in. Patient  stands and  assisted with L AKA prosthetic.  Patient  will be DC'd, states that he drives  his car with the Right LE with prosthetic.  No further PT needs.     Recommendations for follow up therapy are one component of a multi-disciplinary discharge planning process, led by the attending physician.  Recommendations may be updated based on patient status, additional functional criteria and insurance authorization.  Follow Up Recommendations No PT follow up      Assistance Recommended at Discharge None  Patient can return home with the following       Equipment Recommendations None recommended by PT  Recommendations for Other Services       Functional Status Assessment Patient has not had a recent decline in their functional status     Precautions / Restrictions Precautions Precautions: Fall Precaution Comments: L AKA prosthetic, R BKA prosthetic      Mobility  Bed Mobility               General bed mobility comments: seated on bed edge    Transfers Overall transfer level: Needs assistance Equipment used: Rolling walker (2 wheels) Transfers: Sit  to/from Stand             General transfer comment: pt stood from elevated bed to pull up pants after bil. prosthetics placed.    Ambulation/Gait                  Stairs            Wheelchair Mobility    Modified Rankin (Stroke Patients Only)       Balance Overall balance assessment: Modified Independent                                           Pertinent Vitals/Pain Pain Assessment Pain Assessment: No/denies pain    Home Living Family/patient expects to be discharged to:: Private residence Living Arrangements: Parent Available Help at Discharge: Family;Available 24 hours/day Type of Home: House Home Access: Stairs to enter Entrance Stairs-Rails: Psychiatric nurse of Steps: 3     Home Equipment: Geographical information systems officer (4 wheels)      Prior Function Prior Level of Function : Independent/Modified Independent             Mobility Comments: Uses rollator and bilateral prosthetic for short distance ambulation. Uses WC for further mobility       Hand Dominance   Dominant Hand: Right    Extremity/Trunk Assessment   Upper Extremity Assessment Upper Extremity Assessment: Overall WFL for tasks assessed  Lower Extremity Assessment Lower Extremity Assessment: RLE deficits/detail;LLE deficits/detail LLE Deficits / Details: AKA    Cervical / Trunk Assessment Cervical / Trunk Assessment: Other exceptions  Communication   Communication: No difficulties  Cognition Arousal/Alertness: Awake/alert Behavior During Therapy: WFL for tasks assessed/performed Overall Cognitive Status: Within Functional Limits for tasks assessed                                          General Comments      Exercises     Assessment/Plan    PT Assessment Patient needs continued PT services  PT Problem List         PT Treatment Interventions      PT Goals (Current goals can be  found in the Care Plan section)  Acute Rehab PT Goals Patient Stated Goal: go home today PT Goal Formulation: All assessment and education complete, DC therapy    Frequency       Co-evaluation               AM-PAC PT "6 Clicks" Mobility  Outcome Measure Help needed turning from your back to your side while in a flat bed without using bedrails?: None Help needed moving from lying on your back to sitting on the side of a flat bed without using bedrails?: None Help needed moving to and from a bed to a chair (including a wheelchair)?: A Little Help needed standing up from a chair using your arms (e.g., wheelchair or bedside chair)?: A Little Help needed to walk in hospital room?: A Little Help needed climbing 3-5 steps with a railing? : A Little 6 Click Score: 20    End of Session   Activity Tolerance: Patient tolerated treatment well Patient left: in bed;with call bell/phone within reach Nurse Communication: Mobility status;Other (comment) PT Visit Diagnosis: Difficulty in walking, not elsewhere classified (R26.2)    Time: 1000-1015 PT Time Calculation (min) (ACUTE ONLY): 15 min   Charges:   PT Evaluation $PT Eval Low Complexity: 1 Low          [place] [facility] [phone] Weekend [phone]   Claretha Cooper 12/20/2021, 1:24 PM

## 2021-12-26 ENCOUNTER — Encounter: Payer: Medicaid Other | Admitting: Registered"

## 2021-12-29 ENCOUNTER — Observation Stay (HOSPITAL_COMMUNITY): Payer: Medicare Other

## 2021-12-29 ENCOUNTER — Other Ambulatory Visit: Payer: Self-pay

## 2021-12-29 ENCOUNTER — Inpatient Hospital Stay (HOSPITAL_COMMUNITY)
Admission: EM | Admit: 2021-12-29 | Discharge: 2022-01-01 | DRG: 638 | Disposition: A | Discharge status: Home Health | Payer: Medicare Other | Attending: Internal Medicine | Admitting: Internal Medicine

## 2021-12-29 ENCOUNTER — Encounter (HOSPITAL_COMMUNITY): Payer: Self-pay | Admitting: Internal Medicine

## 2021-12-29 DIAGNOSIS — Z8249 Family history of ischemic heart disease and other diseases of the circulatory system: Secondary | ICD-10-CM

## 2021-12-29 DIAGNOSIS — Z888 Allergy status to other drugs, medicaments and biological substances status: Secondary | ICD-10-CM

## 2021-12-29 DIAGNOSIS — Z8673 Personal history of transient ischemic attack (TIA), and cerebral infarction without residual deficits: Secondary | ICD-10-CM

## 2021-12-29 DIAGNOSIS — N189 Chronic kidney disease, unspecified: Secondary | ICD-10-CM

## 2021-12-29 DIAGNOSIS — Z881 Allergy status to other antibiotic agents status: Secondary | ICD-10-CM

## 2021-12-29 DIAGNOSIS — N400 Enlarged prostate without lower urinary tract symptoms: Secondary | ICD-10-CM | POA: Diagnosis present

## 2021-12-29 DIAGNOSIS — Z79899 Other long term (current) drug therapy: Secondary | ICD-10-CM

## 2021-12-29 DIAGNOSIS — Z7902 Long term (current) use of antithrombotics/antiplatelets: Secondary | ICD-10-CM

## 2021-12-29 DIAGNOSIS — E785 Hyperlipidemia, unspecified: Secondary | ICD-10-CM | POA: Diagnosis present

## 2021-12-29 DIAGNOSIS — Z818 Family history of other mental and behavioral disorders: Secondary | ICD-10-CM

## 2021-12-29 DIAGNOSIS — N3000 Acute cystitis without hematuria: Secondary | ICD-10-CM | POA: Diagnosis not present

## 2021-12-29 DIAGNOSIS — E1100 Type 2 diabetes mellitus with hyperosmolarity without nonketotic hyperglycemic-hyperosmolar coma (NKHHC): Principal | ICD-10-CM | POA: Diagnosis present

## 2021-12-29 DIAGNOSIS — B20 Human immunodeficiency virus [HIV] disease: Secondary | ICD-10-CM | POA: Diagnosis present

## 2021-12-29 DIAGNOSIS — W19XXXA Unspecified fall, initial encounter: Secondary | ICD-10-CM | POA: Diagnosis not present

## 2021-12-29 DIAGNOSIS — Z794 Long term (current) use of insulin: Secondary | ICD-10-CM

## 2021-12-29 DIAGNOSIS — N184 Chronic kidney disease, stage 4 (severe): Secondary | ICD-10-CM | POA: Diagnosis present

## 2021-12-29 DIAGNOSIS — E669 Obesity, unspecified: Secondary | ICD-10-CM | POA: Diagnosis present

## 2021-12-29 DIAGNOSIS — I10 Essential (primary) hypertension: Secondary | ICD-10-CM | POA: Diagnosis present

## 2021-12-29 DIAGNOSIS — Z6841 Body Mass Index (BMI) 40.0 and over, adult: Secondary | ICD-10-CM

## 2021-12-29 DIAGNOSIS — I2510 Atherosclerotic heart disease of native coronary artery without angina pectoris: Secondary | ICD-10-CM | POA: Diagnosis present

## 2021-12-29 DIAGNOSIS — Z89511 Acquired absence of right leg below knee: Secondary | ICD-10-CM

## 2021-12-29 DIAGNOSIS — I252 Old myocardial infarction: Secondary | ICD-10-CM

## 2021-12-29 DIAGNOSIS — E119 Type 2 diabetes mellitus without complications: Secondary | ICD-10-CM

## 2021-12-29 DIAGNOSIS — D631 Anemia in chronic kidney disease: Secondary | ICD-10-CM | POA: Diagnosis present

## 2021-12-29 DIAGNOSIS — B961 Klebsiella pneumoniae [K. pneumoniae] as the cause of diseases classified elsewhere: Secondary | ICD-10-CM | POA: Diagnosis present

## 2021-12-29 DIAGNOSIS — E1122 Type 2 diabetes mellitus with diabetic chronic kidney disease: Secondary | ICD-10-CM | POA: Diagnosis present

## 2021-12-29 DIAGNOSIS — Z862 Personal history of diseases of the blood and blood-forming organs and certain disorders involving the immune mechanism: Secondary | ICD-10-CM

## 2021-12-29 DIAGNOSIS — Y93E1 Activity, personal bathing and showering: Secondary | ICD-10-CM

## 2021-12-29 DIAGNOSIS — R739 Hyperglycemia, unspecified: Secondary | ICD-10-CM | POA: Diagnosis present

## 2021-12-29 DIAGNOSIS — Z809 Family history of malignant neoplasm, unspecified: Secondary | ICD-10-CM

## 2021-12-29 DIAGNOSIS — E782 Mixed hyperlipidemia: Secondary | ICD-10-CM

## 2021-12-29 DIAGNOSIS — Y92009 Unspecified place in unspecified non-institutional (private) residence as the place of occurrence of the external cause: Secondary | ICD-10-CM

## 2021-12-29 DIAGNOSIS — Z7982 Long term (current) use of aspirin: Secondary | ICD-10-CM

## 2021-12-29 DIAGNOSIS — I129 Hypertensive chronic kidney disease with stage 1 through stage 4 chronic kidney disease, or unspecified chronic kidney disease: Secondary | ICD-10-CM | POA: Diagnosis present

## 2021-12-29 DIAGNOSIS — Z955 Presence of coronary angioplasty implant and graft: Secondary | ICD-10-CM

## 2021-12-29 DIAGNOSIS — Z89612 Acquired absence of left leg above knee: Secondary | ICD-10-CM

## 2021-12-29 DIAGNOSIS — B9689 Other specified bacterial agents as the cause of diseases classified elsewhere: Secondary | ICD-10-CM | POA: Diagnosis present

## 2021-12-29 DIAGNOSIS — W182XXA Fall in (into) shower or empty bathtub, initial encounter: Secondary | ICD-10-CM | POA: Diagnosis present

## 2021-12-29 DIAGNOSIS — Z91119 Patient's noncompliance with dietary regimen due to unspecified reason: Secondary | ICD-10-CM

## 2021-12-29 DIAGNOSIS — E1142 Type 2 diabetes mellitus with diabetic polyneuropathy: Secondary | ICD-10-CM | POA: Diagnosis present

## 2021-12-29 DIAGNOSIS — Z8261 Family history of arthritis: Secondary | ICD-10-CM

## 2021-12-29 LAB — HEPATIC FUNCTION PANEL
ALT: 19 U/L (ref 0–44)
AST: 20 U/L (ref 15–41)
Albumin: 3.1 g/dL — ABNORMAL LOW (ref 3.5–5.0)
Alkaline Phosphatase: 129 U/L — ABNORMAL HIGH (ref 38–126)
Bilirubin, Direct: <0.1 mg/dL (ref 0.0–0.2)
Total Bilirubin: 0.6 mg/dL (ref 0.3–1.2)
Total Protein: 7.9 g/dL (ref 6.5–8.1)

## 2021-12-29 LAB — RAPID URINE DRUG SCREEN, HOSP PERFORMED
Amphetamines: NOT DETECTED
Barbiturates: NOT DETECTED
Benzodiazepines: NOT DETECTED
Cocaine: NOT DETECTED
Opiates: NOT DETECTED
Tetrahydrocannabinol: NOT DETECTED

## 2021-12-29 LAB — BASIC METABOLIC PANEL
Anion gap: 10 (ref 5–15)
Anion gap: 8 (ref 5–15)
BUN: 35 mg/dL — ABNORMAL HIGH (ref 6–20)
BUN: 32 mg/dL — ABNORMAL HIGH (ref 6–20)
CO2: 21 mmol/L — ABNORMAL LOW (ref 22–32)
CO2: 24 mmol/L (ref 22–32)
Calcium: 8.9 mg/dL (ref 8.9–10.3)
Calcium: 9 mg/dL (ref 8.9–10.3)
Chloride: 95 mmol/L — ABNORMAL LOW (ref 98–111)
Chloride: 101 mmol/L (ref 98–111)
Creatinine, Ser: 3.32 mg/dL — ABNORMAL HIGH (ref 0.61–1.24)
Creatinine, Ser: 3.14 mg/dL — ABNORMAL HIGH (ref 0.61–1.24)
GFR, Estimated: 21 mL/min — ABNORMAL LOW (ref >60)
GFR, Estimated: 22 mL/min — ABNORMAL LOW (ref >60)
Glucose, Bld: 774 mg/dL (ref 70–99)
Glucose, Bld: 392 mg/dL — ABNORMAL HIGH (ref 70–99)
Potassium: 4.4 mmol/L (ref 3.5–5.1)
Potassium: 3.6 mmol/L (ref 3.5–5.1)
Sodium: 126 mmol/L — ABNORMAL LOW (ref 135–145)
Sodium: 133 mmol/L — ABNORMAL LOW (ref 135–145)

## 2021-12-29 LAB — BETA-HYDROXYBUTYRIC ACID: Beta-Hydroxybutyric Acid: 0.12 mmol/L (ref 0.05–0.27)

## 2021-12-29 LAB — URINALYSIS, ROUTINE W REFLEX MICROSCOPIC
Bilirubin Urine: NEGATIVE
Glucose, UA: >=500 mg/dL — AB
Hgb urine dipstick: NEGATIVE
Ketones, ur: NEGATIVE mg/dL
Nitrite: NEGATIVE
Protein, ur: 100 mg/dL — AB
Specific Gravity, Urine: 1.013 (ref 1.005–1.030)
WBC, UA: >50 WBC/hpf — ABNORMAL HIGH (ref 0–5)
pH: 6 (ref 5.0–8.0)

## 2021-12-29 LAB — BLOOD GAS, VENOUS
Acid-base deficit: 4.5 mmol/L — ABNORMAL HIGH (ref 0.0–2.0)
Bicarbonate: 21.1 mmol/L (ref 20.0–28.0)
O2 Saturation: 87.9 %
Patient temperature: 37
pCO2, Ven: 40 mmHg — ABNORMAL LOW (ref 44–60)
pH, Ven: 7.33 (ref 7.25–7.43)
pO2, Ven: 56 mmHg — ABNORMAL HIGH (ref 32–45)

## 2021-12-29 LAB — CBC
HCT: 33.1 % — ABNORMAL LOW (ref 39.0–52.0)
Hemoglobin: 10.6 g/dL — ABNORMAL LOW (ref 13.0–17.0)
MCH: 29.1 pg (ref 26.0–34.0)
MCHC: 32 g/dL (ref 30.0–36.0)
MCV: 90.9 fL (ref 80.0–100.0)
Platelets: 313 10*3/uL (ref 150–400)
RBC: 3.64 MIL/uL — ABNORMAL LOW (ref 4.22–5.81)
RDW: 13.2 % (ref 11.5–15.5)
WBC: 9.4 10*3/uL (ref 4.0–10.5)
nRBC: 0 % (ref 0.0–0.2)

## 2021-12-29 LAB — CBG MONITORING, ED
Glucose-Capillary: >600 mg/dL (ref 70–99)
Glucose-Capillary: 579 mg/dL (ref 70–99)

## 2021-12-29 LAB — LIPASE, BLOOD: Lipase: 35 U/L (ref 11–51)

## 2021-12-29 LAB — MRSA NEXT GEN BY PCR, NASAL: MRSA by PCR Next Gen: NOT DETECTED

## 2021-12-29 LAB — MAGNESIUM: Magnesium: 1.9 mg/dL (ref 1.7–2.4)

## 2021-12-29 MED ORDER — SODIUM BICARBONATE 650 MG PO TABS
650.0000 mg | ORAL_TABLET | Freq: Two times a day (BID) | ORAL | Status: DC
  Administered 2021-12-29 – 2022-01-01 (×6): 650 mg via ORAL
  Filled 2021-12-29 (×6): qty 1

## 2021-12-29 MED ORDER — ACETAMINOPHEN 325 MG PO TABS
650.0000 mg | ORAL_TABLET | Freq: Four times a day (QID) | ORAL | Status: DC | PRN
  Filled 2021-12-29: qty 2

## 2021-12-29 MED ORDER — DOLUTEGRAVIR-LAMIVUDINE 50-300 MG PO TABS: 1.0000 | ORAL_TABLET | Freq: Every day | ORAL | Status: DC

## 2021-12-29 MED ORDER — ACETAMINOPHEN 650 MG RE SUPP: 650.0000 mg | Freq: Four times a day (QID) | RECTAL | Status: DC | PRN

## 2021-12-29 MED ORDER — CARVEDILOL 3.125 MG PO TABS
3.1250 mg | ORAL_TABLET | Freq: Once | ORAL | Status: AC
  Administered 2021-12-29: 3.125 mg via ORAL
  Filled 2021-12-29: qty 1

## 2021-12-29 MED ORDER — CARVEDILOL 3.125 MG PO TABS: 3.1250 mg | ORAL_TABLET | Freq: Two times a day (BID) | ORAL | Status: DC

## 2021-12-29 MED ORDER — LAMIVUDINE 150 MG PO TABS
300.0000 mg | ORAL_TABLET | Freq: Every day | ORAL | Status: DC
  Administered 2021-12-30 – 2022-01-01 (×3): 300 mg via ORAL
  Filled 2021-12-29 (×3): qty 2

## 2021-12-29 MED ORDER — SODIUM CHLORIDE 0.9 % IV SOLN
1.0000 g | INTRAVENOUS | Status: DC
  Administered 2021-12-29 – 2021-12-31 (×3): 1 g via INTRAVENOUS
  Filled 2021-12-29 (×3): qty 10

## 2021-12-29 MED ORDER — ORAL CARE MOUTH RINSE: 15.0000 mL | OROMUCOSAL | Status: DC | PRN

## 2021-12-29 MED ORDER — AMLODIPINE BESYLATE 10 MG PO TABS
10.0000 mg | ORAL_TABLET | Freq: Every day | ORAL | Status: DC
  Administered 2021-12-30 – 2022-01-01 (×3): 10 mg via ORAL
  Filled 2021-12-29 (×2): qty 1
  Filled 2021-12-29: qty 2

## 2021-12-29 MED ORDER — LOSARTAN POTASSIUM 50 MG PO TABS
50.0000 mg | ORAL_TABLET | Freq: Every day | ORAL | Status: DC
  Administered 2021-12-29 – 2022-01-01 (×4): 50 mg via ORAL
  Filled 2021-12-29: qty 1
  Filled 2021-12-29 (×2): qty 2
  Filled 2021-12-29: qty 1

## 2021-12-29 MED ORDER — FINASTERIDE 5 MG PO TABS
5.0000 mg | ORAL_TABLET | Freq: Every day | ORAL | Status: DC
  Administered 2021-12-30 – 2022-01-01 (×3): 5 mg via ORAL
  Filled 2021-12-29 (×3): qty 1

## 2021-12-29 MED ORDER — INSULIN ASPART PROT & ASPART (70-30 MIX) 100 UNIT/ML ~~LOC~~ SUSP
5.0000 [IU] | Freq: Once | SUBCUTANEOUS | Status: AC
  Administered 2021-12-29: 5 [IU] via SUBCUTANEOUS
  Filled 2021-12-29: qty 10

## 2021-12-29 MED ORDER — GABAPENTIN 100 MG PO CAPS
100.0000 mg | ORAL_CAPSULE | Freq: Two times a day (BID) | ORAL | Status: DC
  Administered 2021-12-29 – 2022-01-01 (×6): 100 mg via ORAL
  Filled 2021-12-29 (×6): qty 1

## 2021-12-29 MED ORDER — HYDRALAZINE HCL 10 MG PO TABS
10.0000 mg | ORAL_TABLET | Freq: Two times a day (BID) | ORAL | Status: DC
  Administered 2021-12-30 – 2022-01-01 (×5): 10 mg via ORAL
  Filled 2021-12-29 (×6): qty 1

## 2021-12-29 MED ORDER — DORAVIRINE 100 MG PO TABS
100.0000 mg | ORAL_TABLET | Freq: Every day | ORAL | Status: DC
  Administered 2021-12-30 – 2022-01-01 (×3): 100 mg via ORAL
  Filled 2021-12-29 (×3): qty 1

## 2021-12-29 MED ORDER — HYDRALAZINE HCL 10 MG PO TABS: 10.0000 mg | ORAL_TABLET | Freq: Two times a day (BID) | ORAL | Status: DC

## 2021-12-29 MED ORDER — SODIUM CHLORIDE 0.45 % IV SOLN
INTRAVENOUS | Status: DC
  Filled 2021-12-29 (×2): qty 1000

## 2021-12-29 MED ORDER — KCL IN DEXTROSE-NACL 10-5-0.45 MEQ/L-%-% IV SOLN
INTRAVENOUS | Status: DC
  Filled 2021-12-29 (×2): qty 1000

## 2021-12-29 MED ORDER — DEXTROSE 50 % IV SOLN: 0.0000 mL | INTRAVENOUS | Status: DC | PRN

## 2021-12-29 MED ORDER — LACTATED RINGERS IV BOLUS
1000.0000 mL | Freq: Once | INTRAVENOUS | Status: AC
  Administered 2021-12-29: 1000 mL via INTRAVENOUS

## 2021-12-29 MED ORDER — INSULIN REGULAR(HUMAN) IN NACL 100-0.9 UT/100ML-% IV SOLN
INTRAVENOUS | Status: DC
  Administered 2021-12-29: 11.5 [IU]/h via INTRAVENOUS
  Filled 2021-12-29: qty 100

## 2021-12-29 MED ORDER — CHLORHEXIDINE GLUCONATE CLOTH 2 % EX PADS
6.0000 | MEDICATED_PAD | Freq: Every day | CUTANEOUS | Status: DC
  Administered 2021-12-29 – 2022-01-01 (×4): 6 via TOPICAL

## 2021-12-29 MED ORDER — ATORVASTATIN CALCIUM 40 MG PO TABS
80.0000 mg | ORAL_TABLET | Freq: Every day | ORAL | Status: DC
  Administered 2021-12-30 – 2022-01-01 (×3): 80 mg via ORAL
  Filled 2021-12-29 (×3): qty 2

## 2021-12-29 MED ORDER — CLOPIDOGREL BISULFATE 75 MG PO TABS
75.0000 mg | ORAL_TABLET | Freq: Every day | ORAL | Status: DC
  Administered 2021-12-30 – 2022-01-01 (×3): 75 mg via ORAL
  Filled 2021-12-29 (×3): qty 1

## 2021-12-29 MED ORDER — HYDRALAZINE HCL 10 MG PO TABS
10.0000 mg | ORAL_TABLET | Freq: Once | ORAL | Status: AC
  Administered 2021-12-29: 10 mg via ORAL
  Filled 2021-12-29: qty 1

## 2021-12-29 MED ORDER — ASPIRIN 81 MG PO TBEC
81.0000 mg | DELAYED_RELEASE_TABLET | Freq: Every day | ORAL | Status: DC
  Administered 2021-12-30 – 2022-01-01 (×3): 81 mg via ORAL
  Filled 2021-12-29 (×3): qty 1

## 2021-12-29 MED ORDER — DOLUTEGRAVIR SODIUM 50 MG PO TABS
50.0000 mg | ORAL_TABLET | Freq: Every day | ORAL | Status: DC
  Administered 2021-12-30 – 2022-01-01 (×3): 50 mg via ORAL
  Filled 2021-12-29 (×3): qty 1

## 2021-12-29 MED ORDER — HYDRALAZINE HCL 20 MG/ML IJ SOLN: 10.0000 mg | INTRAMUSCULAR | Status: DC | PRN

## 2021-12-29 MED ORDER — CARVEDILOL 3.125 MG PO TABS
3.1250 mg | ORAL_TABLET | Freq: Two times a day (BID) | ORAL | Status: DC
  Administered 2021-12-30 – 2022-01-01 (×5): 3.125 mg via ORAL
  Filled 2021-12-29 (×5): qty 1

## 2021-12-29 MED ORDER — ONDANSETRON HCL 4 MG/2ML IJ SOLN
4.0000 mg | Freq: Four times a day (QID) | INTRAMUSCULAR | Status: DC | PRN
  Administered 2021-12-29: 4 mg via INTRAVENOUS
  Filled 2021-12-29 (×3): qty 2

## 2021-12-29 NOTE — ED Provider Notes (Signed)
[facility] DEPT Provider Note   CSN: [id number] Arrival date & time: 12/29/21  1637     History  Chief Complaint  Patient presents with   Hyperglycemia   Fall    Keith [name] is a 56 y.o. male.  Patient is a 56 year old male with a past medical history of diabetes, HIV on [place] with undetectable viral load, hypertension, hyperlipidemia, bilateral BKA presenting to the emergency department after a fall.  The patient states that he was trying to get himself into the shower today when he slipped and fell and was unable to get himself up.  He denies hitting his head or losing consciousness.  The patient states that when medics arrived his blood sugar was read as high.  He states that he does not have a glucometer at home so has not been able to check his sugars recently but reports he has been taking his insulin.  He states he has had increased thirst and urination recently.  He states he has also had diarrhea for the last month.  He states that he has had watery stools about twice a day.  He denies any black or bloody stools.  He denies any recent antibiotics, hospitalizations or travel.  He denies associated abdominal pain.  He states that he felt nauseous and vomited once this morning.  He denies any fevers or chills.  He denies any dysuria or hematuria.  The history is provided by the patient.  Hyperglycemia Fall      Home Medications Prior to Admission medications   Medication Sig Start Date End Date Taking? Authorizing Provider  allopurinol (ZYLOPRIM) 100 MG tablet Take 100 mg by mouth daily.    [provider]  amLODipine (NORVASC) 10 MG tablet Take 10 mg by mouth daily.    [provider]  aspirin EC 81 MG tablet Take 81 mg by mouth daily. Swallow whole.    [provider]  atorvastatin (LIPITOR) 80 MG tablet Take 1 tablet (80 mg total) by mouth daily. 09/01/20 01/02/22  [name], MD  carvedilol (COREG) 3.125 MG tablet  Take 3.125 mg by mouth 2 (two) times daily with a meal.    [provider]  clopidogrel (PLAVIX) 75 MG tablet Take 1 tablet (75 mg total) by mouth daily. 11/15/21   Jettie Booze, MD  cyclobenzaprine (FLEXERIL) 10 MG tablet Take 10 mg by mouth daily as needed for muscle spasms. 09/27/21   [provider]  dapsone 100 MG tablet TAKE 1 TABLET (100 MG TOTAL) BY MOUTH DAILY. Patient taking differently: Take 100 mg by mouth daily. 02/13/21 02/13/22  Golden Circle, FNP  docusate sodium (COLACE) 100 MG capsule Take 1 capsule (100 mg total) by mouth 2 (two) times daily as needed for mild constipation (take when no BM for 1 day.). Patient not taking: Reported on 12/18/2021 12/15/21 12/15/22  [name], MD  dolutegravir-lamiVUDine (DOVATO) 50-300 MG tablet Take 1 tablet by mouth daily. 02/13/21   Golden Circle, FNP  doravirine (PIFELTRO) 100 MG TABS tablet Take 1 tablet (100 mg total) by mouth daily. 02/13/21   Golden Circle, FNP  escitalopram (LEXAPRO) 20 MG tablet Take 1 tablet (20 mg total) by mouth daily. 10/19/21   Hosie Poisson, MD  ferrous sulfate 325 (65 FE) MG tablet Take 1 tablet (325 mg total) by mouth every Monday, Wednesday, and Friday at 6 PM. Do not take with other medicines. 10/19/21 12/18/21  Hosie Poisson, MD  finasteride (  PROSCAR) 5 MG tablet Take 5 mg by mouth daily. 08/20/21   [provider]  gabapentin (NEURONTIN) 100 MG capsule Take 1 capsule (100 mg total) by mouth 3 (three) times daily. Patient taking differently: Take 100 mg by mouth 2 (two) times daily. 10/19/21 12/18/21  Hosie Poisson, MD  HUMALOG KWIKPEN 100 UNIT/ML KwikPen Inject 0-15 Units into the skin See admin instructions. Inject 0-15 units into the skin three times a day with meals, per sliding scale: CBG 70 - 120: 0 units; CBG 121 - 150: 2 units; CBG 151 - 200: 3 units; CBG 201 - 250: 5 units; CBG 251 - 300: 8 units; CBG 301 - 350: 11 units; CBG 351 - 400: 15 units    [provider]  hydrALAZINE (APRESOLINE) 10 MG tablet Take 10 mg by mouth 2 (two) times daily.    [provider]  insulin aspart (NOVOLOG) 100 UNIT/ML injection Inject 4 Units into the skin 3 (three) times daily with meals. Patient not taking: Reported on 12/18/2021 12/09/21   Jonetta Osgood, MD  Lancets [facility] ULTRASOFT) lancets Use as instructed 02/16/20   [name], FNP  LANTUS 100 UNIT/ML injection Inject 0.36 mLs (36 Units total) into the skin at bedtime. 12/20/21   Nita Sells, MD  losartan (COZAAR) 50 MG tablet Take 50 mg by mouth daily.    [provider]  nitroGLYCERIN (NITROSTAT) 0.4 MG SL tablet Place 0.4 mg under the tongue every 5 (five) minutes as needed for chest pain.    [provider]  Oxycodone HCl 10 MG TABS Take 10 mg by mouth every 4 (four) hours. 10/31/21   [provider]  sodium bicarbonate 650 MG tablet Take 1 tablet (650 mg total) by mouth 3 (three) times daily. Patient taking differently: Take 650 mg by mouth 2 (two) times daily. 12/15/21   [name], MD      Allergies    Elemental sulfur, Metformin and related, Zetia [ezetimibe], and Sulfa antibiotics    Review of Systems   Review of Systems  Physical Exam Updated Vital Signs BP (!) 189/109   Pulse 71   Temp 98.1 F (36.7 C) (Oral)   Resp 16   Ht 4\' 8"  (1.422 m)   Wt 117.9 kg   SpO2 96%   BMI 58.29 kg/m  Physical Exam Vitals and nursing note reviewed.  Constitutional:      General: He is not in acute distress.    Appearance: Normal appearance.  HENT:     Head: Normocephalic and atraumatic.     Nose: Nose normal.     Mouth/Throat:     Mouth: Mucous membranes are moist.     Pharynx: Oropharynx is clear.  Eyes:     Extraocular Movements: Extraocular movements intact.     Conjunctiva/sclera: Conjunctivae normal.  Cardiovascular:     Rate and Rhythm: Normal rate and regular rhythm.     Pulses: Normal pulses.     Heart sounds: Normal heart sounds.   Pulmonary:     Effort: Pulmonary effort is normal.     Breath sounds: Normal breath sounds.  Abdominal:     General: Abdomen is flat.     Palpations: Abdomen is soft.     Tenderness: There is no abdominal tenderness.  Musculoskeletal:        General: Normal range of motion.     Cervical back: Normal range of motion and neck supple.     Comments: Bilateral lower extremity  BKA  Skin:    General: Skin is warm and dry.  Neurological:     General: No focal deficit present.     Mental Status: He is alert and oriented to person, place, and time.    ED Results / Procedures / Treatments   Labs (all labs ordered are listed, but only abnormal results are displayed) Labs Reviewed  BASIC METABOLIC PANEL - Abnormal; Notable for the following components:      Result Value   Sodium 126 (*)    Chloride 95 (*)    CO2 21 (*)    Glucose, Bld 774 (*)    BUN 35 (*)    Creatinine, Ser 3.32 (*)    GFR, Estimated 21 (*)    All other components within normal limits  CBC - Abnormal; Notable for the following components:   RBC 3.64 (*)    Hemoglobin 10.6 (*)    HCT 33.1 (*)    All other components within normal limits  BLOOD GAS, VENOUS - Abnormal; Notable for the following components:   pCO2, Ven 40 (*)    pO2, Ven 56 (*)    Acid-base deficit 4.5 (*)    All other components within normal limits  CBG MONITORING, ED - Abnormal; Notable for the following components:   Glucose-Capillary >600 (*)    All other components within normal limits  BETA-HYDROXYBUTYRIC ACID  URINALYSIS, ROUTINE W REFLEX MICROSCOPIC  HEPATIC FUNCTION PANEL  LIPASE, BLOOD  CBG MONITORING, ED    EKG None  Radiology No results found.  Procedures Procedures    Medications Ordered in ED Medications  lactated ringers bolus 1,000 mL (has no administration in time range)  insulin aspart protamine- aspart (NOVOLOG MIX 70/30) injection 5 Units (has no administration in time range)  lactated ringers bolus 1,000 mL (0  mLs Intravenous Stopped 12/29/21 1833)  carvedilol (COREG) tablet 3.125 mg (3.125 mg Oral Given 12/29/21 1832)  hydrALAZINE (APRESOLINE) tablet 10 mg (10 mg Oral Given 12/29/21 1832)    ED Course/ Medical Decision Making/ A&P Clinical Course as of 12/29/21 1859  Sat Dec 29, 2021  1849 Labs show hyperglycemia with a glucose of 776.  He has no anion gap and is not acidotic making DKA unlikely.  He will be given second liter of fluid and 5 units of insulin.  Patient will require admission for blood sugar control. [VK]    Clinical Course User Index [VK] Ottie Glazier, DO                           Medical Decision Making This patient presents to the ED with chief complaint(s) of fall, hyperglycemia with pertinent past medical history of diabetes, HIV on [place], bilateral BKA's, hypertension, hyperlipidemia which further complicates the presenting complaint. The complaint involves an extensive differential diagnosis and also carries with it a high risk of complications and morbidity.    The differential diagnosis includes hyperglycemia, infection, DKA, HHS less likely as the patient is in normal mental status, he has no evidence of acute traumatic injury on exam and is low risk by [place] head CT rule making ICH or mass effect unlikely  Additional history obtained: Additional history obtained from EMS  Records reviewed previous admission documents - multiple admissions in the last month for uncontrolled diabetes  ED Course and Reassessment: Upon patient's arrival to the emergency department his Accu-Chek read high.  IV access was obtained and he was started on IV fluids.  He will have labs performed to evaluate level of his glucose and for DKA.  Patient has no acute traumatic injuries on exam and does not require further trauma evaluation.  Independent labs interpretation:  The following labs were independently interpreted: hyperglycemia without DKA  Independent visualization of  imaging: N/A  Consultation: - Consulted or discussed management/test interpretation w/ external professional: Hospitalist  Consideration for admission or further workup: Patient requires admission for his severe hyperglycemia Social Determinants of health: N/A    Amount and/or Complexity of Data Reviewed Labs: ordered.  Risk Prescription drug management. Decision regarding hospitalization.          Final Clinical Impression(s) / ED Diagnoses Final diagnoses:  Fall in home, initial encounter  Hyperglycemia    Rx / DC Orders ED Discharge Orders     None         Ottie Glazier, DO 12/29/21 2333

## 2021-12-29 NOTE — H&P (Signed)
History and Physical    PLEASE NOTE THAT DRAGON DICTATION SOFTWARE WAS USED IN THE CONSTRUCTION OF THIS NOTE.   Keith [name] LID:[id number] DOB: [birth year] DOA: 12/29/2021  [name], NP  Patient coming from: home   I have personally briefly reviewed patient's old medical records in [place]  Chief Complaint: Fall  HPI: Keith [name] is a 56 y.o. male with medical history significant for poorly controlled type 2 diabetes mellitus, complicated by peripheral polyneuropathy, bilateral BKA's, HIV, CKD 4 with baseline creatinine 3.3-3.8, essential hypertension, hyperlipidemia, BPH, anemia of chronic kidney disease with baseline hemoglobin 10-12, who is admitted to [facility] on 12/29/2021 with hyperglycemia after presenting from home to [facility] ED complaining of fall.   In the context of a documented history of bilateral BKA, the patient experienced a ground-level fall while attempting to transfer into the shower at home earlier today.  Did not hit his head is visible, no associated loss of consciousness.  Reports that this event was purely mechanical in nature, denies any associated chest pain, shortness of breath, palpitations, diaphoresis, nausea, vomiting, dizziness, presyncope, or syncope.  Denies any ensuing development of acute arthralgias/myalgias.  On daily baby aspirin and Plavix.  Denies any acute focal weakness, nor any acute focal numbness, paresthesias, facial droop, acute change in vision, dysphagia, vertigo, dysarthria.   He notes increased urinary frequency over the course of the last week, which she states is heightened even relative to his polyuria/polydipsia that he has been experiencing over the last few months.  Not associated any dysuria or gross hematuria.  Denies any recent subjective fever, chills, rigors, generalized myalgias.  Cough, abdominal pain, headache, neck stiffness.  He notes that he has been experiencing 2 episodes of loose stool per day  for greater than 1 month, in the absence of any melena or hematochezia.  Has been hospitalized twice since the start of September 2023, for hyperglycemia, as well as a total of 5 admissions indication since July 2023.  He notes good compliance with his outpatient insulin regimen, which, after most recent modifications at time of most recent disposition is now Lantus 36 units SQ nightly as well as sliding scale Humalog 3 times daily with meals.  He has good compliance with visits vasoconstricting insulin, may no recently missed doses thereof.  Not on any oral hypoglycemic agents.   Denies any recurrent or recent alcohol consumption, and no history of recreational drug use.     ED Course:  Vital signs in the ED were notable for the following: Afebrile; heart rate 88-75; initial systolic blood pressures in the 180s, decreasing slightly into the 170s following resumption of portion of home bp meds, as below; respiratory rate 16-18, oxygen saturation 95 to 99% on room air.  Labs were notable for the following: CMP showed sodium 126 which corrected to approximately 137 following glycemic correction, potassium 4.4, bicarbonate 21, anion gap 10, creatinine 3.32 compared to most recent read by 3.77 on 12/20/2021, glucose 774, liver enzymes within normal limits.  Lipase 35.  VBG 7.33/40/56/21.1; beta-hydroxybutyrate acid 0.12.  CBC notable for white was count 9400, hemoglobin 10.6 with normocytic/normochromic properties related to 10.19 1423.  Urinalysis states that he is appearing specimen with greater than 50 blood cells, large suicide history [place] squamous epithelial cells.   While in the ED, the following were administered: Coreg 3.125 mg p.o. x1 hydralazine tomograms p.o. x1, NovoLog 70/30 IV and subcu x1 dose, 2 L LR bolus.  Subsequently, the  patient was admitted for further evaluation management of hyperglycemia, in the absence of DKA, presenting with low mechanical fall, with presentation also  suggestive of acute cystitis.     Review of Systems: As per HPI otherwise 10 point review of systems negative.   Past Medical History:  Diagnosis Date   Abscess of right foot    abscess/ulcer of R transtibial amputation requiring IV abx   Acute ST elevation myocardial infarction (STEMI) due to occlusion of circumflex coronary artery ([place]) 03/22/2020   AIDS ([place])    Anemia    CAD (coronary artery disease)    a. MI with stenting of OM1 in 11/2018 with residual disease. b. acute STEMI 03/2020 s/p DES to OM1   Chronic anemia    Chronic knee pain    right   Chronic pain    CKD (chronic kidney disease), stage IV (HCC)    CVA (cerebral vascular accident) ([place]) 04/2020   Diabetes type 2, uncontrolled    HgA1c 17.6 (04/27/2010)   Diabetic foot ulcer ([place]) 01/2017   right foot   Dilatation of aorta (HCC)    Erectile dysfunction    Genital warts    GERD (gastroesophageal reflux disease)    History of blood transfusion    HIV (human immunodeficiency virus infection) ([place]) 2009   CD4 count 100, VL 13800 (05/01/2010)   Hyperlipidemia    Hypertension    Myocardial infarction [facility])    Neuropathy    Noncompliance with medication regimen    Osteomyelitis ([place])    h/o hand   Osteomyelitis of metatarsal ([place]) 04/28/2017   Pneumonia    STEMI (ST elevation myocardial infarction) ([place]) 11/10/2018    Past Surgical History:  Procedure Laterality Date   AMPUTATION Right 05/02/2017   Procedure: AMPUTATION TRANSMETARSAL;  Surgeon: [name], MD;  Location: [place];  Service: Orthopedics;  Laterality: Right;   AMPUTATION Right 06/13/2017   Procedure: RIGHT BELOW KNEE AMPUTATION;  Surgeon: [name], MD;  Location: [place];  Service: Orthopedics;  Laterality: Right;   AMPUTATION Left 10/27/2020   Procedure: LEFT BELOW KNEE AMPUTATION;  Surgeon: [name], MD;  Location: [place];  Service: Orthopedics;  Laterality: Left;   AMPUTATION Left 11/17/2020   Procedure: REVISION AMPUTATION BELOW  KNEE, LEFT;  Surgeon: [name], MD;  Location: [place];  Service: Orthopedics;  Laterality: Left;   AMPUTATION Left 01/03/2021   Procedure: LEFT ABOVE KNEE AMPUTATION;  Surgeon: [name], MD;  Location: [place];  Service: Orthopedics;  Laterality: Left;   APPLICATION OF WOUND VAC Left 10/27/2020   Procedure: APPLICATION OF WOUND VAC;  Surgeon: [name], MD;  Location: [place];  Service: Orthopedics;  Laterality: Left;   BELOW KNEE LEG AMPUTATION Right 06/13/2017   BELOW KNEE LEG AMPUTATION     CORONARY STENT INTERVENTION N/A 11/10/2018   Procedure: CORONARY STENT INTERVENTION;  Surgeon: Leonie Man, MD;  Location: [place] CV LAB;  Service: Cardiovascular;  Laterality: N/A;   CORONARY/GRAFT ACUTE MI REVASCULARIZATION N/A 03/21/2020   Procedure: Coronary/Graft Acute MI Revascularization;  Surgeon: [name], MD;  Location: [place] CV [facility];  Service: Cardiovascular;  Laterality: N/A;   CYSTOSCOPY N/A 11/09/2020   Procedure: CYSTOSCOPY, EVACUATION OF CLOTS, FULGERATION OF BLADDER AND BILATERIAL RETROGRADE PYELOGRAMS;  Surgeon: Janith Lima, MD;  Location: [place];  Service: Urology;  Laterality: N/A;   HERNIA REPAIR     I & D EXTREMITY Left 08/21/2014   Procedure: INCISION AND  DRAINAGE LEFT SMALL FINGER;  Surgeon: Leanora Cover, MD;  Location: [place];  Service: Orthopedics;  Laterality: Left;   I & D EXTREMITY Right 03/18/2017   Procedure: IRRIGATION AND DEBRIDEMENT EXTREMITY;  Surgeon: [name], MD;  Location: [place];  Service: Orthopedics;  Laterality: Right;   IR FLUORO GUIDE CV LINE RIGHT  11/02/2020   IR REMOVAL TUN CV CATH W/O FL  11/13/2020   IR US GUIDE VASC ACCESS RIGHT  11/02/2020   LEFT HEART CATH AND CORONARY ANGIOGRAPHY N/A 11/10/2018   Procedure: LEFT HEART CATH AND CORONARY ANGIOGRAPHY;  Surgeon: Leonie Man, MD;  Location: [place] [facility];  Service: Cardiovascular;  Laterality: N/A;   LEFT HEART CATH AND CORONARY ANGIOGRAPHY N/A 03/21/2020   Procedure:  LEFT HEART CATH AND CORONARY ANGIOGRAPHY;  Surgeon: [name], MD;  Location: [place] [facility];  Service: Cardiovascular;  Laterality: N/A;   MINOR IRRIGATION AND DEBRIDEMENT OF WOUND Right 04/22/2014   Procedure: IRRIGATION AND DEBRIDEMENT OF RIGHT NECK ABCESS;  Surgeon: Jerrell Belfast, MD;  Location: [place];  Service: ENT;  Laterality: Right;   MULTIPLE EXTRACTIONS WITH ALVEOLOPLASTY N/A 01/18/2013   Procedure: MULTIPLE EXTRACION 3, 6, 7, 10, 11, 13, 21, 22, 27, 28, 29, 30 WITH ALVEOLOPLASTY;  Surgeon: Gae Bon, DDS;  Location: [place];  Service: Oral Surgery;  Laterality: N/A;   SKIN SPLIT GRAFT Right 03/21/2017   Procedure: IRRIGATION AND DEBRIDEMENT RIGHT FOOT AND APPLY SPLIT THICKNESS SKIN GRAFT AND WOUND VAC;  Surgeon: [name], MD;  Location: [place];  Service: Orthopedics;  Laterality: Right;   STUMP REVISION Left 12/02/2020   Procedure: REVISION LEFT BELOW KNEE AMPUTATION;  Surgeon: [name], MD;  Location: [place];  Service: Orthopedics;  Laterality: Left;   TEE WITHOUT CARDIOVERSION N/A 05/02/2017   Procedure: TRANSESOPHAGEAL ECHOCARDIOGRAM (TEE);  Surgeon: Acie Fredrickson Wonda Cheng, MD;  Location: [facility] OR;  Service: Cardiovascular;  Laterality: N/A;  coincidental to orthopedic case    Social History:  reports that he has never smoked. He has never used smokeless tobacco. He reports that he does not currently use alcohol. He reports that he does not use drugs.   Allergies  Allergen Reactions   Elemental Sulfur Itching    Patient stated he's allergic to "sulfur" AND "sulfa"   Metformin And Related Other (See Comments)    Stopped by MD- affected kidneys   Zetia [Ezetimibe] Nausea Only   Sulfa Antibiotics Itching    Family History  Problem Relation Age of Onset   Hypertension Mother    Arthritis Father    Hypertension Father    Hypertension Brother    Cancer Maternal Grandmother 21       unknown type of cancer   Depression Paternal Grandmother     Family history  reviewed and not pertinent    Prior to Admission medications   Medication Sig Start Date End Date Taking? Authorizing Provider  allopurinol (ZYLOPRIM) 100 MG tablet Take 100 mg by mouth daily.    [provider]  amLODipine (NORVASC) 10 MG tablet Take 10 mg by mouth daily.    [provider]  aspirin EC 81 MG tablet Take 81 mg by mouth daily. Swallow whole.    [provider]  atorvastatin (LIPITOR) 80 MG tablet Take 1 tablet (80 mg total) by mouth daily. 09/01/20 01/02/22  [name], MD  carvedilol (COREG) 3.125 MG tablet Take 3.125 mg by mouth 2 (two) times daily with a meal.  [provider]  clopidogrel (PLAVIX) 75 MG tablet Take 1 tablet (75 mg total) by mouth daily. 11/15/21   Jettie Booze, MD  cyclobenzaprine (FLEXERIL) 10 MG tablet Take 10 mg by mouth daily as needed for muscle spasms. 09/27/21   [provider]  dapsone 100 MG tablet TAKE 1 TABLET (100 MG TOTAL) BY MOUTH DAILY. Patient taking differently: Take 100 mg by mouth daily. 02/13/21 02/13/22  Golden Circle, FNP  docusate sodium (COLACE) 100 MG capsule Take 1 capsule (100 mg total) by mouth 2 (two) times daily as needed for mild constipation (take when no BM for 1 day.). Patient not taking: Reported on 12/18/2021 12/15/21 12/15/22  [name], MD  dolutegravir-lamiVUDine (DOVATO) 50-300 MG tablet Take 1 tablet by mouth daily. 02/13/21   Golden Circle, FNP  doravirine (PIFELTRO) 100 MG TABS tablet Take 1 tablet (100 mg total) by mouth daily. 02/13/21   Golden Circle, FNP  escitalopram (LEXAPRO) 20 MG tablet Take 1 tablet (20 mg total) by mouth daily. 10/19/21   Hosie Poisson, MD  ferrous sulfate 325 (65 FE) MG tablet Take 1 tablet (325 mg total) by mouth every Monday, Wednesday, and Friday at 6 PM. Do not take with other medicines. 10/19/21 12/18/21  Hosie Poisson, MD  finasteride (PROSCAR) 5 MG tablet Take 5 mg by mouth daily. 08/20/21   [provider]  gabapentin  (NEURONTIN) 100 MG capsule Take 1 capsule (100 mg total) by mouth 3 (three) times daily. Patient taking differently: Take 100 mg by mouth 2 (two) times daily. 10/19/21 12/18/21  Hosie Poisson, MD  HUMALOG KWIKPEN 100 UNIT/ML KwikPen Inject 0-15 Units into the skin See admin instructions. Inject 0-15 units into the skin three times a day with meals, per sliding scale: CBG 70 - 120: 0 units; CBG 121 - 150: 2 units; CBG 151 - 200: 3 units; CBG 201 - 250: 5 units; CBG 251 - 300: 8 units; CBG 301 - 350: 11 units; CBG 351 - 400: 15 units    [provider]  hydrALAZINE (APRESOLINE) 10 MG tablet Take 10 mg by mouth 2 (two) times daily.    [provider]  insulin aspart (NOVOLOG) 100 UNIT/ML injection Inject 4 Units into the skin 3 (three) times daily with meals. Patient not taking: Reported on 12/18/2021 12/09/21   Jonetta Osgood, MD  Lancets [facility] ULTRASOFT) lancets Use as instructed 02/16/20   [name], FNP  LANTUS 100 UNIT/ML injection Inject 0.36 mLs (36 Units total) into the skin at bedtime. 12/20/21   Nita Sells, MD  losartan (COZAAR) 50 MG tablet Take 50 mg by mouth daily.    [provider]  nitroGLYCERIN (NITROSTAT) 0.4 MG SL tablet Place 0.4 mg under the tongue every 5 (five) minutes as needed for chest pain.    [provider]  Oxycodone HCl 10 MG TABS Take 10 mg by mouth every 4 (four) hours. 10/31/21   [provider]  sodium bicarbonate 650 MG tablet Take 1 tablet (650 mg total) by mouth 3 (three) times daily. Patient taking differently: Take 650 mg by mouth 2 (two) times daily. 12/15/21   [name], MD     Objective    Physical Exam: Vitals:   12/29/21 1701 12/29/21 1755 12/29/21 1815 12/29/21 1845  BP:  (!) 181/116 (!) 193/115 (!) 189/109  Pulse:  71 73 71  Resp:  20 (!) 22 16  Temp:      TempSrc:  SpO2:  98% 98% 96%  Weight: 117.9 kg     Height: _0  (1.422 m)       General: appears to be stated age;  alert, oriented Skin: warm, dry, no rash Head:  AT/[place] Mouth:  Oral mucosa membranes appear dry, normal dentition Neck: supple; trachea midline Heart:  RRR; did not appreciate any M/R/G Lungs: CTAB, did not appreciate any wheezes, rales, or rhonchi Abdomen: + BS; soft, ND, NT Vascular:  2+ radial pulses b/l; bilateral BKA noted Extremities: no peripheral edema, no muscle wasting; bilateral BKA noted Neuro: strength and sensation intact in upper and lower extremities b/l     Labs on Admission: I have personally reviewed following labs and imaging studies  CBC: Recent Labs  Lab 12/29/21 1725  WBC 9.4  HGB 10.6*  HCT 33.1*  MCV 90.9  PLT 354   Basic Metabolic Panel: Recent Labs  Lab 12/29/21 1725  NA 126*  K 4.4  CL 95*  CO2 21*  GLUCOSE 774*  BUN 35*  CREATININE 3.32*  CALCIUM 8.9   GFR: Estimated Creatinine Clearance: 24.9 mL/min (A) (by C-G formula based on SCr of 3.32 mg/dL (H)). Liver Function Tests: No results for input(s): "AST", "ALT", "ALKPHOS", "BILITOT", "PROT", "ALBUMIN" in the last 168 hours. No results for input(s): "LIPASE", "AMYLASE" in the last 168 hours. No results for input(s): "AMMONIA" in the last 168 hours. Coagulation Profile: No results for input(s): "INR", "PROTIME" in the last 168 hours. Cardiac Enzymes: No results for input(s): "CKTOTAL", "CKMB", "CKMBINDEX", "TROPONINI" in the last 168 hours. BNP (last 3 results) No results for input(s): "PROBNP" in the last 8760 hours. HbA1C: No results for input(s): "HGBA1C" in the last 72 hours. CBG: Recent Labs  Lab 12/29/21 1657  GLUCAP >600*   Lipid Profile: No results for input(s): "CHOL", "HDL", "LDLCALC", "TRIG", "CHOLHDL", "LDLDIRECT" in the last 72 hours. Thyroid Function Tests: No results for input(s): "TSH", "T4TOTAL", "FREET4", "T3FREE", "THYROIDAB" in the last 72 hours. Anemia Panel: No results for input(s): "VITAMINB12", "FOLATE", "FERRITIN", "TIBC", "IRON", "RETICCTPCT" in the  last 72 hours. Urine analysis:    Component Value Date/Time   COLORURINE STRAW (A) 12/29/2021 1850   APPEARANCEUR HAZY (A) 12/29/2021 1850   LABSPEC 1.013 12/29/2021 1850   PHURINE 6.0 12/29/2021 1850   GLUCOSEU >=500 (A) 12/29/2021 1850   HGBUR NEGATIVE 12/29/2021 1850   BILIRUBINUR NEGATIVE 12/29/2021 1850   BILIRUBINUR negative 09/07/2018 [place] 12/29/2021 1850   PROTEINUR 100 (A) 12/29/2021 1850   UROBILINOGEN 1.0 09/07/2018 1034   UROBILINOGEN 1.0 01/01/2017 1032   NITRITE NEGATIVE 12/29/2021 1850   LEUKOCYTESUR LARGE (A) 12/29/2021 1850    Radiological Exams on Admission: No results found.    Assessment/Plan    Principal Problem:   Hyperglycemia Active Problems:   HTN (hypertension)   Hyperlipidemia   Insulin-requiring or dependent type II diabetes mellitus (HCC)   HIV disease (HCC)   CKD (chronic kidney disease) stage 4, GFR 15-29 ml/min (HCC)   BPH (benign prostatic hyperplasia)   Fall at home, initial encounter   Acute cystitis   History of anemia due to chronic kidney disease     #) Hyperglycemia: The attacks of a known diagnosis of poorly controlled type 2 diabetes mellitus, most recently with an A1c 11.5% on 12/08/21 with at least 5 hospitalizations for similar over the last 2 months, patient noted blood sugars elevated nearly 800 at the time of this evening's presentation despite reported compliance with home insulin regimen.  Will appears that there may be contribution from acute cystitis, as further detailed below, suspect, given frequency of associated hospitalizations, that he would benefit from further optimization of his outpatient insulin regimen, particularly given degree of elevation of hemoglobin A1c value checked 3 weeks ago.  Consequently, we will place consult for the morning with diabetic educator, and plan to continue insulin drip via Endo tool overnight in the interval.  No evidence of additional acute infection contributing to  hyperglycemic presentation, although will also check cxr to further eval. no other overt provoking features identified, but will further expand evaluation for such, as detailed below.   No evidence of associated EKG in the absence of anion gap metabolic acidosis, VBG is consistent with metabolic acidosis, beta hydroxybutyrate acid also nonelevated, and UA no evidence of ketones.  Degree of elevation likely consistent with HHNK, but will add on serum osmolality to further assess.   Status post 2 L LR administered in the ED this evening.  Will provide an additional 1 L bolus at this time before transitioning to half NS with 10 mg once per liter KCl given presenting potassium level 4.4.   Plan: Insulin drip per Endotool hyperglycemic protocol. Q1H cbg's. Q4H BMP's. NPO for now until ensuing improvement in degree of presenting hyperglycemic as well as ability to tolerate PO. IVF's: LR x 1 additional liter bolus, followed by  1/2NS with 10 mEq/L Kcl @ 125 cc/hr , with plan to transition to D5-1/2NS with 10 mEq/L Kcl @ 125 cc/hr Once [facility] cbg's reflect serum glucose less than 250. Check serum Mg and Phos levels, w/ prn supplementation per protocol. , holding home insulin for now.  Consult placed with diabetic educator.  Monitor on telemetry. Monitor strict I's & O's.  Prn IV Zofran. check serum osmolality, urine drug screen, chest x-ray, EKG.  Further evaluation management suspect acute cystitis, as below.             #) Acute cystitis: Suspected diagnosis setting of recent ground-level mechanical fall as well as hyperglycemia related to average blood sugar established by most recent A1c level, while urinalysis notable for significant pyuria without evidence of sclerosis.  This is just a contaminated specimen, all in the context of patient's report of recent development of increasing urinary frequency.  Of note, in the absence of fever or leukocytosis, SIRS criteria not currently met for sepsis.    Plan: Add on urine culture to existing specimen. Start roccephin. Repeat CBC with differential in the morning.          #) Ground level mechanical fall: it appears that the patient experienced a ground level fall that appears to be purely mechanical in nature, with increased risk for such in setting of h/o b/l bka.  Not associated with any preceding or resultant loss of consciousness. Not a/w any presyncope or chest pain. Did not hit head as a component of this fall. Blood thinners as outpatient: Daily baby aspirin, Plavix.  Lasix with a prolonged downtime. Denies any significant ensuing acute arthralgias or myalgias. Presentation does not appear to be associated any acute focal neurologic deficits.  Additional potential previous risk factors also include suspected acute cystitis, as described above.   Plan: Fall precautions. Repeat CMP and CBC with differential in the morning.  Further evaluation and management of acute cystitis, as above.          #) HIV: Documented history of such, on Dovato and Doravirine as an outpatient.  Plan: Continue home  Dovato and  Doravirine.  Further chart review for determination of this recent prior CD4 count viral load.           #) CKD stage IV: Associated with baseline creat range 3.3-3.8 Percent creatinine consistent with this range.   Plan: Monitor strict I's and O's and daily weights.  Due to void nephrotoxic agents.  Repeat CMP in the morning.  Check serum magnesium and phosphorus levels.            #) Hyperlipidemia: documented h/o such. On high intensity atorvastatin as outpatient.   Plan: continue home statin.              #) Essential Hypertension: documented h/o such, with outpatient antihypertensive regimen including Coreg, Norvasc, hydralazine, losartan.  SBP's in the ED today: Initially in the 180s closely shows improvement in his mid 38s following resumption of home beta-blocker and hydralazine.  As  renal function is at baseline and no evidence of hyperkalemia, will also resume losartan at this time.  Plan: Close monitoring of subsequent BP via routine VS. resuming home beta-blocker, hydralazine, losartan, with plans to also resume amlodipine tomorrow. Prn iv hydralazine for SBP > 180 mmHg or DBP > 120 mmHg.  CMP in the a.m.           #) Benign Prostatic Hyperplasia:  documented h/o such; on finasteride as outpatient.  Does not appear to be on tamsulosin or additional alpha 1 blocker.  Plan: monitor strict I's & O's and daily weights. Repeat BMP in AM.  continue home finasteride.            #) Anemia of chronic kidney disease: Documented history of such, a/w with baseline hgb range 10-12, with presenting hgb consistent with this range, in the absence of any overt evidence of active bleed.     Plan: Repeat CBC in the morning.        DVT prophylaxis: SCD's   Code Status: Full code Family Communication: none Disposition Plan: Per Rounding Team Consults called: none;  Admission status: Observation   PLEASE NOTE THAT DRAGON DICTATION SOFTWARE WAS USED IN THE CONSTRUCTION OF THIS NOTE.   H. [place] DO [facility] [place]   12/29/2021, 7:34 PM

## 2021-12-29 NOTE — ED Notes (Signed)
Receiving RN Wallace Cullens has agreed to accept [facility] once pt has arrived to inpatient unit, all questions and concerns address. This RN to transport pt to inpatient unit

## 2021-12-29 NOTE — Plan of Care (Signed)
Discussed with patient plan of care for the evening, pain management and admission questions with some teach back displayed.  Will give hypertension medications at bed time.  Problem: Education: Goal: Ability to describe self-care measures that may prevent or decrease complications (Diabetes Survival Skills Education) will improve Outcome: Progressing   Problem: Education: Goal: Individualized Educational Video(s) Outcome: Progressing

## 2021-12-29 NOTE — ED Notes (Signed)
Inpatient RN Wallace Cullens aware pt's BP HTN, hx of same. Dr. Velia Meyer response "I'm reordering his home BP meds now, and will also add a prn anti-hypertensive" refer to [facility].

## 2021-12-29 NOTE — ED Notes (Addendum)
Secure message sent to receiving RN Wallace Cullens, awaiting response. Pt has an inpatient bed at this time

## 2021-12-29 NOTE — ED Triage Notes (Signed)
Pt BIBA from hotel. Pt has bilateral BKA and slipped getting off toilet. No head trauma, no LOC, no blood thinners. No pain to report. Pt c/o increased urination.  Aox4  CBG: HIGH  BP: 132/86 HR: 84 SPO2: 99 RA

## 2021-12-30 DIAGNOSIS — R739 Hyperglycemia, unspecified: Secondary | ICD-10-CM | POA: Diagnosis not present

## 2021-12-30 LAB — CBC WITH DIFFERENTIAL/PLATELET
Abs Immature Granulocytes: 0.04 10*3/uL (ref 0.00–0.07)
Basophils Absolute: 0 10*3/uL (ref 0.0–0.1)
Basophils Relative: 0 %
Eosinophils Absolute: 0.2 10*3/uL (ref 0.0–0.5)
Eosinophils Relative: 3 %
HCT: 31.4 % — ABNORMAL LOW (ref 39.0–52.0)
Hemoglobin: 10 g/dL — ABNORMAL LOW (ref 13.0–17.0)
Immature Granulocytes: 0 %
Lymphocytes Relative: 18 %
Lymphs Abs: 1.6 10*3/uL (ref 0.7–4.0)
MCH: 28.7 pg (ref 26.0–34.0)
MCHC: 31.8 g/dL (ref 30.0–36.0)
MCV: 90 fL (ref 80.0–100.0)
Monocytes Absolute: 0.7 10*3/uL (ref 0.1–1.0)
Monocytes Relative: 8 %
Neutro Abs: 6.3 10*3/uL (ref 1.7–7.7)
Neutrophils Relative %: 71 %
Platelets: 292 10*3/uL (ref 150–400)
RBC: 3.49 MIL/uL — ABNORMAL LOW (ref 4.22–5.81)
RDW: 13 % (ref 11.5–15.5)
WBC: 9 10*3/uL (ref 4.0–10.5)
nRBC: 0 % (ref 0.0–0.2)

## 2021-12-30 LAB — BASIC METABOLIC PANEL
Anion gap: 8 (ref 5–15)
BUN: 29 mg/dL — ABNORMAL HIGH (ref 6–20)
CO2: 23 mmol/L (ref 22–32)
Calcium: 8.6 mg/dL — ABNORMAL LOW (ref 8.9–10.3)
Chloride: 105 mmol/L (ref 98–111)
Creatinine, Ser: 2.75 mg/dL — ABNORMAL HIGH (ref 0.61–1.24)
GFR, Estimated: 26 mL/min — ABNORMAL LOW (ref >60)
Glucose, Bld: 165 mg/dL — ABNORMAL HIGH (ref 70–99)
Potassium: 3.5 mmol/L (ref 3.5–5.1)
Sodium: 136 mmol/L (ref 135–145)

## 2021-12-30 LAB — GLUCOSE, CAPILLARY
Glucose-Capillary: 132 mg/dL — ABNORMAL HIGH (ref 70–99)
Glucose-Capillary: 137 mg/dL — ABNORMAL HIGH (ref 70–99)
Glucose-Capillary: 153 mg/dL — ABNORMAL HIGH (ref 70–99)
Glucose-Capillary: 426 mg/dL — ABNORMAL HIGH (ref 70–99)
Glucose-Capillary: 141 mg/dL — ABNORMAL HIGH (ref 70–99)
Glucose-Capillary: 145 mg/dL — ABNORMAL HIGH (ref 70–99)
Glucose-Capillary: 146 mg/dL — ABNORMAL HIGH (ref 70–99)
Glucose-Capillary: 148 mg/dL — ABNORMAL HIGH (ref 70–99)
Glucose-Capillary: 195 mg/dL — ABNORMAL HIGH (ref 70–99)
Glucose-Capillary: 239 mg/dL — ABNORMAL HIGH (ref 70–99)
Glucose-Capillary: 279 mg/dL — ABNORMAL HIGH (ref 70–99)

## 2021-12-30 LAB — COMPREHENSIVE METABOLIC PANEL
ALT: 16 U/L (ref 0–44)
AST: 14 U/L — ABNORMAL LOW (ref 15–41)
Albumin: 2.8 g/dL — ABNORMAL LOW (ref 3.5–5.0)
Alkaline Phosphatase: 102 U/L (ref 38–126)
Anion gap: 8 (ref 5–15)
BUN: 32 mg/dL — ABNORMAL HIGH (ref 6–20)
CO2: 23 mmol/L (ref 22–32)
Calcium: 8.8 mg/dL — ABNORMAL LOW (ref 8.9–10.3)
Chloride: 103 mmol/L (ref 98–111)
Creatinine, Ser: 2.79 mg/dL — ABNORMAL HIGH (ref 0.61–1.24)
GFR, Estimated: 26 mL/min — ABNORMAL LOW (ref >60)
Glucose, Bld: 141 mg/dL — ABNORMAL HIGH (ref 70–99)
Potassium: 3.7 mmol/L (ref 3.5–5.1)
Sodium: 134 mmol/L — ABNORMAL LOW (ref 135–145)
Total Bilirubin: 0.6 mg/dL (ref 0.3–1.2)
Total Protein: 7.1 g/dL (ref 6.5–8.1)

## 2021-12-30 LAB — MAGNESIUM: Magnesium: 1.7 mg/dL (ref 1.7–2.4)

## 2021-12-30 LAB — OSMOLALITY: Osmolality: 317 mOsm/kg — ABNORMAL HIGH (ref 275–295)

## 2021-12-30 LAB — PHOSPHORUS: Phosphorus: 4.2 mg/dL (ref 2.5–4.6)

## 2021-12-30 MED ORDER — INSULIN GLARGINE-YFGN 100 UNIT/ML ~~LOC~~ SOLN
36.0000 [IU] | Freq: Every day | SUBCUTANEOUS | Status: DC
  Administered 2021-12-30 – 2022-01-01 (×3): 36 [IU] via SUBCUTANEOUS
  Filled 2021-12-30 (×3): qty 0.36

## 2021-12-30 MED ORDER — HEPARIN SODIUM (PORCINE) 5000 UNIT/ML IJ SOLN
5000.0000 [IU] | Freq: Three times a day (TID) | INTRAMUSCULAR | Status: DC
  Administered 2021-12-30 – 2022-01-01 (×7): 5000 [IU] via SUBCUTANEOUS
  Filled 2021-12-30 (×7): qty 1

## 2021-12-30 MED ORDER — INSULIN ASPART 100 UNIT/ML IJ SOLN
0.0000 [IU] | Freq: Every day | INTRAMUSCULAR | Status: DC
  Administered 2021-12-30: 3 [IU] via SUBCUTANEOUS
  Administered 2021-12-31: 4 [IU] via SUBCUTANEOUS

## 2021-12-30 MED ORDER — INSULIN ASPART 100 UNIT/ML IJ SOLN
0.0000 [IU] | Freq: Three times a day (TID) | INTRAMUSCULAR | Status: DC
  Administered 2021-12-30: 3 [IU] via SUBCUTANEOUS
  Administered 2021-12-30 – 2021-12-31 (×2): 5 [IU] via SUBCUTANEOUS
  Administered 2021-12-31 (×2): 8 [IU] via SUBCUTANEOUS
  Administered 2022-01-01: 5 [IU] via SUBCUTANEOUS
  Administered 2022-01-01: 10 [IU] via SUBCUTANEOUS

## 2021-12-30 NOTE — Inpatient Diabetes Management (Signed)
Inpatient Diabetes Program Recommendations  AACE/ADA: New Consensus Statement on Inpatient Glycemic Control (2015)  Target Ranges:  Prepandial:   less than 140 mg/dL      Peak postprandial:   less than 180 mg/dL (1-2 hours)      Critically ill patients:  140 - 180 mg/dL   Lab Results  Component Value Date   GLUCAP 148 (H) 12/30/2021   HGBA1C 11.5 (H) 12/08/2021    Review of Glycemic Control  Diabetes history:  DM2  Outpatient Diabetes medications:  Lantus 36 units QD Humalog 0-15 units TID  Current orders for Inpatient glycemic control:  Transitioning from IV insulin to Semglee 36 units QD, Novolog 0-15 units TID and 0-5 units QHS  Inpatient Diabetes Program Recommendations:    Will likely need meal coverage as he has required in past hospitalizations.  Might consider:  Novolog 3 units TID with meals if consumes at least 50%  Referral received for DM management evaluation; poorly controlled diabetes with recurrent hospitalizations for hyperglycemia.  Patient is well known to our team.  Has had 30 day readmissions and 6 admissions in the last 6 mos.    Ordered Living Well with diabetes booklet.  Will speak with patient this admission.    Will continue to follow while inpatient.  Thank you, Reche Dixon, MSN, [place] Diabetes Coordinator Inpatient Diabetes Program [phone] (team pager from 8a-5p)

## 2021-12-30 NOTE — Progress Notes (Signed)
PROGRESS NOTE  Keith [name]  KGM:[id number] DOB: Dec 17, 1965 DOA: 12/29/2021 [name], NP   Brief Narrative:  Patient is a 56 year old male with history of poorly controlled diabetes type 2, peripheral neuropathy, bilateral BKA, HIV, CKD stage IV with baseline creatinine of 2.3-3.8, hypertension, hyperlipidemia, BPH, anemia of chronic disease who presented with fall while attempting to transfer into the shower at home.  Also complained of increased urinary frequency, polydipsia, polyuria.  He has been hospitalized several times recently for hypoglycemia even with good compliance with his outpatient insulin regimen.  On presentation, he was hypertensive.  Lab work showed sodium of 126, glucose of 774, no anion gap.  Patient was admitted for the management of severe hyperglycemia   Assessment & Plan:  Principal Problem:   Hyperglycemia Active Problems:   HTN (hypertension)   Hyperlipidemia   Insulin-requiring or dependent type II diabetes mellitus (HCC)   HIV disease (HCC)   CKD (chronic kidney disease) stage 4, GFR 15-29 ml/min (HCC)   BPH (benign prostatic hyperplasia)   Fall at home, initial encounter   Acute cystitis   History of anemia due to chronic kidney disease   Severe hyperglycemia/HHNK: Known history of uncontrolled diabetes type 2.  Last hemoglobin A1c of 11.5 as per 12/08/2021.  At least 5 admissions since last 2 months. Continue current insulin regimen.  Diabetic coordinator consulted. Started on insulin drip.  We will transition to long-acting and sliding scale.  No evidence of DKA.    Acute cystitis: Presented with dysuria, increased urinary frequency.  Started on ceftriaxone, follow-up cultures.  Patient is afebrile.denies any dysuria today.  Fall: Patient has right BKA, left AKA .ambulates with the help of wheelchair.  History of HIV: On Davato,Darovirine  CKD stage IV: Baseline creatinine ranged from 3.3-3.8.  Currently kidney function at  baseline.  Hyperlipidemia: Continue Lipitor  Hypertension: Remains hypertensive.  Takes Coreg, Norvasc, hydralazine, losartan at home.  Home medications resumed.  BPH: Continue finasteride  Anemia of chronic disease: Currently hemoglobin stable,Hb stable         DVT prophylaxis:Heparin [place]     Code Status: Full Code  Family Communication: None at bedside  Patient status:Inpatient  Patient is from :Home  Anticipated discharge UY:QIHK  Estimated DC date:tomorrow   Consultants: None  Procedures:None  Antimicrobials:  Anti-infectives (From admission, onward)    Start     Dose/Rate Route Frequency Ordered Stop   12/30/21 1000  dolutegravir-lamiVUDine (DOVATO) 50-300 MG per tablet 1 tablet  Status:  Discontinued        1 tablet Oral Daily 12/29/21 2041 12/29/21 2151   12/30/21 1000  doravirine (PIFELTRO) tablet 100 mg        100 mg Oral Daily 12/29/21 2041     12/30/21 1000  dolutegravir (TIVICAY) tablet 50 mg       See Hyperspace for full Linked Orders Report.   50 mg Oral Daily 12/29/21 2152     12/30/21 1000  lamiVUDine (EPIVIR) tablet 300 mg       See Hyperspace for full Linked Orders Report.   300 mg Oral Daily 12/29/21 2152     12/29/21 2100  cefTRIAXone (ROCEPHIN) 1 g in sodium chloride 0.9 % 100 mL IVPB        1 g 200 mL/hr over 30 Minutes Intravenous Every 24 hours 12/29/21 2039         Subjective:  Patient seen and examined at the bedside today.  Hemodynamically stable.  Comfortable.  Denies any  new complaints.  He wants to eat something.  Objective: Vitals:   12/30/21 0300 12/30/21 0400 12/30/21 0500 12/30/21 0600  BP: 129/67 (!) 153/69 (!) 151/76 (!) 152/79  Pulse: 64 70 66 63  Resp: 13 19 11 13   Temp:      TempSrc:      SpO2: 96% 96% 92% 95%  Weight:      Height:        Intake/Output Summary (Last 24 hours) at 12/30/2021 0743 Last data filed at 12/30/2021 0656 Gross per 24 hour  Intake 3465.69 ml  Output 1850 ml  Net 1615.69 ml    Filed Weights   12/29/21 1701 12/29/21 2147 12/30/21 0137  Weight: 117.9 kg 123.5 kg 123.5 kg    Examination:  General exam: Overall comfortable, not in distress, obese HEENT: PERRL Respiratory system:  no wheezes or crackles  Cardiovascular system: S1 & S2 heard, RRR.  Gastrointestinal system: Abdomen is nondistended, soft and nontender. Central nervous system: Alert and oriented Extremities: Left AKA, right BKA Skin: No rashes, no ulcers,no icterus     Data Reviewed: I have personally reviewed following labs and imaging studies  CBC: Recent Labs  Lab 12/29/21 1725 12/30/21 0034  WBC 9.4 9.0  NEUTROABS  --  6.3  HGB 10.6* 10.0*  HCT 33.1* 31.4*  MCV 90.9 90.0  PLT 313 245   Basic Metabolic Panel: Recent Labs  Lab 12/29/21 1725 12/29/21 2129 12/30/21 0034  NA 126* 133* 134*  K 4.4 3.6 3.7  CL 95* 101 103  CO2 21* 24 23  GLUCOSE 774* 392* 141*  BUN 35* 32* 32*  CREATININE 3.32* 3.14* 2.79*  CALCIUM 8.9 9.0 8.8*  MG 1.9  --  1.7  PHOS  --   --  4.2     Recent Results (from the past 240 hour(s))  MRSA Next Gen by PCR, Nasal     Status: None   Collection Time: 12/29/21  8:53 PM   Specimen: Nasal Mucosa; Nasal Swab  Result Value Ref Range Status   MRSA by PCR Next Gen NOT DETECTED NOT DETECTED Final    Comment: (NOTE) The GeneXpert MRSA Assay (FDA approved for NASAL specimens only), is one component of a comprehensive MRSA colonization surveillance program. It is not intended to diagnose MRSA infection nor to guide or monitor treatment for MRSA infections. Test performance is not FDA approved in patients less than 25 years old. Performed at [facility], [place] [address] [place], [place] [address]      Radiology Studies: DG Chest Port 1 View  Result Date: 12/29/2021 CLINICAL DATA:  Shortness of breath EXAM: PORTABLE CHEST 1 VIEW COMPARISON:  12/25/2021 FINDINGS: The heart size and mediastinal contours are within normal limits.  Both lungs are clear. The visualized skeletal structures are unremarkable. IMPRESSION: No active disease. Electronically Signed   By: Rolm Baptise M.D.   On: 12/29/2021 22:05    Scheduled Meds:  amLODipine  10 mg Oral Daily   aspirin EC  81 mg Oral Daily   atorvastatin  80 mg Oral Daily   carvedilol  3.125 mg Oral BID WC   Chlorhexidine Gluconate Cloth  6 each Topical Daily   clopidogrel  75 mg Oral Daily   dolutegravir  50 mg Oral Daily   And   lamiVUDine  300 mg Oral Daily   doravirine  100 mg Oral Daily   finasteride  5 mg Oral Daily   gabapentin  100 mg Oral BID  hydrALAZINE  10 mg Oral BID   losartan  50 mg Oral Daily   sodium bicarbonate  650 mg Oral BID   Continuous Infusions:  cefTRIAXone (ROCEPHIN)  IV Stopped (12/29/21 2250)   dextrose 5 % and 0.45 % NaCl with KCl 10 mEq/L 125 mL/hr at 12/30/21 0656   insulin 1.6 Units/hr (12/30/21 0656)   sodium chloride 0.45 % 1,000 mL with potassium chloride 10 mEq infusion Stopped (12/29/21 2320)     LOS: 0 days   Shelly Coss, MD [facility] P9/24/2023, 7:43 AM

## 2021-12-31 DIAGNOSIS — E669 Obesity, unspecified: Secondary | ICD-10-CM | POA: Diagnosis present

## 2021-12-31 DIAGNOSIS — Z89612 Acquired absence of left leg above knee: Secondary | ICD-10-CM | POA: Diagnosis not present

## 2021-12-31 DIAGNOSIS — Z881 Allergy status to other antibiotic agents status: Secondary | ICD-10-CM | POA: Diagnosis not present

## 2021-12-31 DIAGNOSIS — D631 Anemia in chronic kidney disease: Secondary | ICD-10-CM | POA: Diagnosis present

## 2021-12-31 DIAGNOSIS — E785 Hyperlipidemia, unspecified: Secondary | ICD-10-CM | POA: Diagnosis present

## 2021-12-31 DIAGNOSIS — Z6841 Body Mass Index (BMI) 40.0 and over, adult: Secondary | ICD-10-CM | POA: Diagnosis not present

## 2021-12-31 DIAGNOSIS — W182XXA Fall in (into) shower or empty bathtub, initial encounter: Secondary | ICD-10-CM | POA: Diagnosis present

## 2021-12-31 DIAGNOSIS — Y93E1 Activity, personal bathing and showering: Secondary | ICD-10-CM | POA: Diagnosis not present

## 2021-12-31 DIAGNOSIS — Z91119 Patient's noncompliance with dietary regimen due to unspecified reason: Secondary | ICD-10-CM | POA: Diagnosis not present

## 2021-12-31 DIAGNOSIS — Z89511 Acquired absence of right leg below knee: Secondary | ICD-10-CM | POA: Diagnosis not present

## 2021-12-31 DIAGNOSIS — B961 Klebsiella pneumoniae [K. pneumoniae] as the cause of diseases classified elsewhere: Secondary | ICD-10-CM | POA: Diagnosis present

## 2021-12-31 DIAGNOSIS — E1100 Type 2 diabetes mellitus with hyperosmolarity without nonketotic hyperglycemic-hyperosmolar coma (NKHHC): Secondary | ICD-10-CM | POA: Diagnosis present

## 2021-12-31 DIAGNOSIS — Z955 Presence of coronary angioplasty implant and graft: Secondary | ICD-10-CM | POA: Diagnosis not present

## 2021-12-31 DIAGNOSIS — Z794 Long term (current) use of insulin: Secondary | ICD-10-CM | POA: Diagnosis not present

## 2021-12-31 DIAGNOSIS — Z888 Allergy status to other drugs, medicaments and biological substances status: Secondary | ICD-10-CM | POA: Diagnosis not present

## 2021-12-31 DIAGNOSIS — I129 Hypertensive chronic kidney disease with stage 1 through stage 4 chronic kidney disease, or unspecified chronic kidney disease: Secondary | ICD-10-CM | POA: Diagnosis present

## 2021-12-31 DIAGNOSIS — N400 Enlarged prostate without lower urinary tract symptoms: Secondary | ICD-10-CM | POA: Diagnosis present

## 2021-12-31 DIAGNOSIS — N184 Chronic kidney disease, stage 4 (severe): Secondary | ICD-10-CM | POA: Diagnosis present

## 2021-12-31 DIAGNOSIS — N3000 Acute cystitis without hematuria: Secondary | ICD-10-CM | POA: Diagnosis present

## 2021-12-31 DIAGNOSIS — B20 Human immunodeficiency virus [HIV] disease: Secondary | ICD-10-CM | POA: Diagnosis present

## 2021-12-31 DIAGNOSIS — E1142 Type 2 diabetes mellitus with diabetic polyneuropathy: Secondary | ICD-10-CM | POA: Diagnosis present

## 2021-12-31 DIAGNOSIS — E1122 Type 2 diabetes mellitus with diabetic chronic kidney disease: Secondary | ICD-10-CM | POA: Diagnosis present

## 2021-12-31 DIAGNOSIS — I252 Old myocardial infarction: Secondary | ICD-10-CM | POA: Diagnosis not present

## 2021-12-31 DIAGNOSIS — Z7982 Long term (current) use of aspirin: Secondary | ICD-10-CM | POA: Diagnosis not present

## 2021-12-31 DIAGNOSIS — Z7902 Long term (current) use of antithrombotics/antiplatelets: Secondary | ICD-10-CM | POA: Diagnosis not present

## 2021-12-31 DIAGNOSIS — B9689 Other specified bacterial agents as the cause of diseases classified elsewhere: Secondary | ICD-10-CM | POA: Diagnosis present

## 2021-12-31 DIAGNOSIS — R739 Hyperglycemia, unspecified: Secondary | ICD-10-CM | POA: Diagnosis present

## 2021-12-31 DIAGNOSIS — Y92009 Unspecified place in unspecified non-institutional (private) residence as the place of occurrence of the external cause: Secondary | ICD-10-CM | POA: Diagnosis not present

## 2021-12-31 LAB — GLUCOSE, CAPILLARY
Glucose-Capillary: 148 mg/dL — ABNORMAL HIGH (ref 70–99)
Glucose-Capillary: 137 mg/dL — ABNORMAL HIGH (ref 70–99)
Glucose-Capillary: 160 mg/dL — ABNORMAL HIGH (ref 70–99)
Glucose-Capillary: 166 mg/dL — ABNORMAL HIGH (ref 70–99)
Glucose-Capillary: 268 mg/dL — ABNORMAL HIGH (ref 70–99)
Glucose-Capillary: 275 mg/dL — ABNORMAL HIGH (ref 70–99)
Glucose-Capillary: 282 mg/dL — ABNORMAL HIGH (ref 70–99)
Glucose-Capillary: 329 mg/dL — ABNORMAL HIGH (ref 70–99)

## 2021-12-31 NOTE — Inpatient Diabetes Management (Signed)
Inpatient Diabetes Program Recommendations  AACE/ADA: New Consensus Statement on Inpatient Glycemic Control (2015)  Target Ranges:  Prepandial:   less than 140 mg/dL      Peak postprandial:   less than 180 mg/dL (1-2 hours)      Critically ill patients:  140 - 180 mg/dL   Lab Results  Component Value Date   GLUCAP 275 (H) 12/31/2021   HGBA1C 11.5 (H) 12/08/2021    Review of Glycemic Control  Diabetes history: DM2 Outpatient Diabetes medications: Lantus 30 units QHS, Novolog 10 units TID Current orders for Inpatient glycemic control: Lantus 36 units QD, Novolog 0-15 units TID with meals and 0-5 HS  HgbA1C - 11.5% PCP - has appt with PCP at [facility] on 01/02/2022  Inpatient Diabetes Program Recommendations:    Add Novolog 6 units TID with meals if eating > 50%  Spoke with pt at bedside regarding his diabetes control and HgbA1C of 11.5%. Pt states his meter is broken and he needs a new one. Has been drinking regular sodas and large quantities of macaroni and cheese. States he does not miss his insulin. Rarely ever has lows. [facility]. Understands he needs to make some changes with diet, discussed plate method and watching his portions of high-CHO foods. Eats a lot of meals at relatives' homes. Reviewed hypoglycemia s/s and treatment. Recommend discharging home on:  Lantus solostar pen 36 units QHS Novolog flexpen 10 units TID Blood glucose meter kit - #[id number]  Pt committed to leaving off regular soda and eating less of high carbohydrate foods. Discussed with RN.  Thank you. Lorenda Peck, RD, LDN, [place] Inpatient Diabetes Coordinator [phone]

## 2021-12-31 NOTE — TOC Transition Note (Addendum)
Transition of Care [facility]) - CM/SW Discharge Note   Patient Details  Name: MIQUAN TANDON MRN: [id number] Date of Birth: 06/20/1965  Transition of Care [facility]) CM/SW Contact:  Ross Ludwig, LCSW Phone Number: 12/31/2021, 12:43 PM   Clinical Narrative:     CSW received consult that patient lives in a hotel and wants assistance with housing.  CSW is unable to assist, he will have to contact Department of Social Services to see if he is eligible for housing assistance.  CSW to add DSS contact information to AVS.  CSW was also informed that patient has difficulty with his medication cost, patient has Medicaid, there is not any other assistance available for him.  Bedside nurse asked if patient can be provided assistance with applying for disability, CSW informed bedside nurse, he would have to talk to the social security office to apply for disability, contact information will be added to AVS as well. Per chart review patient does drive and has prosthetics that he uses for both legs.   2:15pm  CSW received a phone call from [facility] at [place], who said patient is currently open to them for [facility] PT, Aide, and social work.    Per [facility] patient has [facility] Medicare policy number is [id number] as well for his insurance, and the social worker has been working with patient to provide resources for him, and try to assist him with housing options.    Per [facility], due to several readmissions they have not been able to consistently see him.  [facility] also said that he was living with family, but he was not able to stay there anymore, so he has been staying at an extended stay hotel for several weeks now.  [facility] is agreeable to continue to follow patient for home health services.  TOC to continue to follow patient's progress throughout discharge planning.   Final next level of care: Home/Self Care Barriers to Discharge: Continued Medical Work up   Patient Goals and CMS Choice Patient states their  goals for this hospitalization and ongoing recovery are:: To return back to the hotel he is staying at.      Discharge Placement  Plan to return back to hotel.                     Discharge Plan and Services    HH PT, RN, aide, and social work.                                 Social Determinants of Health (SDOH) Interventions     Readmission Risk Interventions    10/10/2021    1:31 PM 10/21/2020   11:53 AM 08/31/2020    2:30 PM  Readmission Risk Prevention Plan  Transportation Screening Complete Complete   PCP or Specialist Appt within 3-5 Days     HRI or [place] for [place] Planning/Counseling     Palliative Care Screening     Medication Review [employer]) Complete Complete   PCP or Specialist appointment within 3-5 days of discharge Complete Complete   HRI or [place] Complete Complete   SW Recovery Care/Counseling Consult Complete Complete   Palliative Care Screening Not Applicable Not Applicable   [place] Not Applicable Complete      Information is confidential and restricted. Go to Review Flowsheets to unlock data.

## 2021-12-31 NOTE — Progress Notes (Signed)
PROGRESS NOTE  Keith [name]  XNA:[id number] DOB: Aug 10, 1965 DOA: 12/29/2021 [name], NP   Brief Narrative:  Patient is a 56 year old male with history of poorly controlled diabetes type 2, peripheral neuropathy, AKA/BKA, HIV, CKD stage IV with baseline creatinine of 2.3-3.8, hypertension, hyperlipidemia, BPH, anemia of chronic disease who presented with fall while attempting to transfer into the shower at home.  Also complained of increased urinary frequency, polydipsia, polyuria.  He has been hospitalized several times recently for hyperglycemia even with good compliance with his outpatient insulin regimen.  On presentation, he was hypertensive.  Lab work showed sodium of 126, glucose of 774, no anion gap.  Patient was admitted for the management of severe hyperglycemia   Assessment & Plan:  Principal Problem:   Hyperglycemia Active Problems:   HTN (hypertension)   Hyperlipidemia   Insulin-requiring or dependent type II diabetes mellitus (HCC)   HIV disease (HCC)   CKD (chronic kidney disease) stage 4, GFR 15-29 ml/min (HCC)   BPH (benign prostatic hyperplasia)   Fall at home, initial encounter   Acute cystitis   History of anemia due to chronic kidney disease   Severe hyperglycemia/HHNK: Known history of uncontrolled diabetes type 2.  Last hemoglobin A1c of 11.5 as per 12/08/2021.  At least 5 admissions since last 2 months. Continue current insulin regimen.  Diabetic coordinator consulted.  Will follow recommendations for discharge. Started on insulin drip.  Now transitioned to long-acting and sliding scale.  No evidence of DKA.    Acute cystitis: Presented with dysuria, increased urinary frequency.  Started on ceftriaxone, follow-up cultures.  Patient is afebrile.denies any dysuria today.  Urine culture showed 20,000 colonies of gram-negative rods, will follow sensitivity  Fall: Patient has right BKA, left AKA .ambulates with the help of wheelchair.  History of HIV:  On Davato,Darovirine  CKD stage IV: Baseline creatinine ranged from 3.3-3.8.  Currently kidney function at baseline.  Hyperlipidemia: Continue Lipitor  Hypertension: Blood pressure stable now.  Takes Coreg, Norvasc, hydralazine, losartan at home.  Home medications resumed.  BPH: Continue finasteride  Anemia of chronic disease: Currently hemoglobin stable         DVT prophylaxis:heparin injection 5,000 Units Start: 12/30/21 1400Heparin [place]     Code Status: Full Code  Family Communication: None at bedside  Patient status:Inpatient  Patient is from :Home(lives in motel)  Anticipated discharge to: Same  Estimated DC date:tomorrow   Consultants: None  Procedures:None  Antimicrobials:  Anti-infectives (From admission, onward)    Start     Dose/Rate Route Frequency Ordered Stop   12/30/21 1000  dolutegravir-lamiVUDine (DOVATO) 50-300 MG per tablet 1 tablet  Status:  Discontinued        1 tablet Oral Daily 12/29/21 2041 12/29/21 2151   12/30/21 1000  doravirine (PIFELTRO) tablet 100 mg        100 mg Oral Daily 12/29/21 2041     12/30/21 1000  dolutegravir (TIVICAY) tablet 50 mg       See Hyperspace for full Linked Orders Report.   50 mg Oral Daily 12/29/21 2152     12/30/21 1000  lamiVUDine (EPIVIR) tablet 300 mg       See Hyperspace for full Linked Orders Report.   300 mg Oral Daily 12/29/21 2152     12/29/21 2100  cefTRIAXone (ROCEPHIN) 1 g in sodium chloride 0.9 % 100 mL IVPB        1 g 200 mL/hr over 30 Minutes Intravenous Every 24 hours 12/29/21 2039  Subjective:  Patient seen and examined at the bedside today.  Hemodynamically stable without any complaints.  He says he cannot go today to his motel, he needs to make arrangements, use insulin.  TOC notified   Objective: Vitals:   12/30/21 2112 12/31/21 0129 12/31/21 0532 12/31/21 0924  BP: (!) 142/80 (!) 163/94 134/84 116/78  Pulse: 68 65 63 66  Resp: 18 18 18 18   Temp: (!) 97.4 F (36.3 C) 98  F (36.7 C) 98.1 F (36.7 C) 98.6 F (37 C)  TempSrc: Axillary Oral Oral Oral  SpO2: 100% 99% 99% 100%  Weight:      Height:        Intake/Output Summary (Last 24 hours) at 12/31/2021 1122 Last data filed at 12/31/2021 1033 Gross per 24 hour  Intake 1280.21 ml  Output 1510 ml  Net -229.79 ml   Filed Weights   12/29/21 1701 12/29/21 2147 12/30/21 0137  Weight: 117.9 kg 123.5 kg 123.5 kg    Examination:  General exam: Overall comfortable, not in distress, obese HEENT: PERRL Respiratory system:  no wheezes or crackles  Cardiovascular system: S1 & S2 heard, RRR.  Gastrointestinal system: Abdomen is nondistended, soft and nontender. Central nervous system: Alert and oriented Extremities: Left AKA, right BKA Skin: No rashes, no ulcers,no icterus       Data Reviewed: I have personally reviewed following labs and imaging studies  CBC: Recent Labs  Lab 12/29/21 1725 12/30/21 0034  WBC 9.4 9.0  NEUTROABS  --  6.3  HGB 10.6* 10.0*  HCT 33.1* 31.4*  MCV 90.9 90.0  PLT 313 644   Basic Metabolic Panel: Recent Labs  Lab 12/29/21 1725 12/29/21 2129 12/30/21 0034 12/30/21 0843  NA 126* 133* 134* 136  K 4.4 3.6 3.7 3.5  CL 95* 101 103 105  CO2 21* 24 23 23   GLUCOSE 774* 392* 141* 165*  BUN 35* 32* 32* 29*  CREATININE 3.32* 3.14* 2.79* 2.75*  CALCIUM 8.9 9.0 8.8* 8.6*  MG 1.9  --  1.7  --   PHOS  --   --  4.2  --      Recent Results (from the past 240 hour(s))  Urine Culture     Status: Abnormal (Preliminary result)   Collection Time: 12/29/21  6:50 PM   Specimen: Urine, Clean Catch  Result Value Ref Range Status   Specimen Description   Final    URINE, CLEAN CATCH Performed at [facility], [place] [address], [place], [place] [address]    Special Requests   Final    NONE Performed at [facility], [place] [address], [place], [place] [address]    Culture 20,000 COLONIES/mL GRAM NEGATIVE RODS (A)  Final   Report Status  PENDING  Incomplete  MRSA Next Gen by PCR, Nasal     Status: None   Collection Time: 12/29/21  8:53 PM   Specimen: Nasal Mucosa; Nasal Swab  Result Value Ref Range Status   MRSA by PCR Next Gen NOT DETECTED NOT DETECTED Final    Comment: (NOTE) The GeneXpert MRSA Assay (FDA approved for NASAL specimens only), is one component of a comprehensive MRSA colonization surveillance program. It is not intended to diagnose MRSA infection nor to guide or monitor treatment for MRSA infections. Test performance is not FDA approved in patients less than 28 years old. Performed at [facility], [place] [address], [place], [place] [address]      Radiology Studies: [facility] Chest [place]  1 View  Result Date: 12/29/2021 CLINICAL DATA:  Shortness of breath EXAM: PORTABLE CHEST 1 VIEW COMPARISON:  12/25/2021 FINDINGS: The heart size and mediastinal contours are within normal limits. Both lungs are clear. The visualized skeletal structures are unremarkable. IMPRESSION: No active disease. Electronically Signed   By: Rolm Baptise M.D.   On: 12/29/2021 22:05    Scheduled Meds:  amLODipine  10 mg Oral Daily   aspirin EC  81 mg Oral Daily   atorvastatin  80 mg Oral Daily   carvedilol  3.125 mg Oral BID WC   Chlorhexidine Gluconate Cloth  6 each Topical Daily   clopidogrel  75 mg Oral Daily   dolutegravir  50 mg Oral Daily   And   lamiVUDine  300 mg Oral Daily   doravirine  100 mg Oral Daily   finasteride  5 mg Oral Daily   gabapentin  100 mg Oral BID   heparin injection (subcutaneous)  5,000 Units Subcutaneous Q8H   hydrALAZINE  10 mg Oral BID   insulin aspart  0-15 Units Subcutaneous TID WC   insulin aspart  0-5 Units Subcutaneous QHS   insulin glargine-yfgn  36 Units Subcutaneous Daily   losartan  50 mg Oral Daily   sodium bicarbonate  650 mg Oral BID   Continuous Infusions:  cefTRIAXone (ROCEPHIN)  IV 1 g (12/30/21 2200)     LOS: 0 days   Shelly Coss, MD [facility] P9/25/2023, 11:22 AM

## 2022-01-01 ENCOUNTER — Other Ambulatory Visit (HOSPITAL_COMMUNITY): Payer: Self-pay

## 2022-01-01 DIAGNOSIS — R739 Hyperglycemia, unspecified: Secondary | ICD-10-CM | POA: Diagnosis not present

## 2022-01-01 LAB — GLUCOSE, CAPILLARY
Glucose-Capillary: 441 mg/dL — ABNORMAL HIGH (ref 70–99)
Glucose-Capillary: 358 mg/dL — ABNORMAL HIGH (ref 70–99)
Glucose-Capillary: 237 mg/dL — ABNORMAL HIGH (ref 70–99)

## 2022-01-01 MED ORDER — BLOOD GLUCOSE MONITOR KIT
PACK | 0 refills | Status: DC
  Filled 2022-01-01: qty 1, 28d supply, fill #0

## 2022-01-01 MED ORDER — UNIFINE PENTIPS 32G X 6 MM MISC
0 refills | Status: DC
  Filled 2022-01-01: qty 100, 25d supply, fill #0

## 2022-01-01 MED ORDER — INSULIN LISPRO (1 UNIT DIAL) 100 UNIT/ML (KWIKPEN)
10.0000 [IU] | PEN_INJECTOR | Freq: Three times a day (TID) | SUBCUTANEOUS | 1 refills | Status: DC
  Filled 2022-01-01: qty 15, 50d supply, fill #0

## 2022-01-01 MED ORDER — INSULIN GLARGINE 100 UNIT/ML SOLOSTAR PEN
38.0000 [IU] | PEN_INJECTOR | Freq: Every day | SUBCUTANEOUS | 1 refills | Status: DC
  Filled 2022-01-01: qty 15, 39d supply, fill #0

## 2022-01-01 MED ORDER — ACCU-CHEK GUIDE VI STRP
ORAL_STRIP | 0 refills | Status: DC
  Filled 2022-01-01: qty 100, 25d supply, fill #0

## 2022-01-01 MED ORDER — ACCU-CHEK SOFTCLIX LANCETS MISC
0 refills | Status: DC
  Filled 2022-01-01: qty 100, 25d supply, fill #0

## 2022-01-01 MED ORDER — INSULIN GLARGINE-YFGN 100 UNIT/ML ~~LOC~~ SOLN: 38.0000 [IU] | Freq: Every day | SUBCUTANEOUS | Status: DC

## 2022-01-01 MED ORDER — INSULIN ASPART 100 UNIT/ML IJ SOLN
10.0000 [IU] | Freq: Three times a day (TID) | INTRAMUSCULAR | Status: DC
  Administered 2022-01-01: 10 [IU] via SUBCUTANEOUS

## 2022-01-01 MED ORDER — INSULIN ASPART 100 UNIT/ML IJ SOLN
10.0000 [IU] | Freq: Once | INTRAMUSCULAR | Status: AC
  Administered 2022-01-01: 10 [IU] via SUBCUTANEOUS

## 2022-01-01 NOTE — Discharge Summary (Signed)
Physician Discharge Summary  Keith [name] TDV:[id number] DOB: May 22, 1965 DOA: 12/29/2021  [name], NP  Admit date: 12/29/2021 Discharge date: 01/01/2022  Admitted From: Home Disposition:  Home  Discharge Condition:Stable CODE STATUS:FULL Diet recommendation: Heart Healthy   Brief/Interim Summary:  Patient is a 56 year old male with history of poorly controlled diabetes type 2, peripheral neuropathy, AKA/BKA, HIV, CKD stage IV with baseline creatinine of 2.3-3.8, hypertension, hyperlipidemia, BPH, anemia of chronic disease who presented with fall while attempting to transfer into the shower at home.  Also complained of increased urinary frequency, polydipsia, polyuria.  He has been hospitalized several times recently for hyperglycemia even with good compliance with his outpatient insulin regimen.  On presentation, he was hypertensive.  Lab work showed sodium of 126, glucose of 774, no anion gap.  Patient was admitted for the management of severe hyperglycemia.  Managed with insulin.  Diabetic monitor following.  He is very noncompliant and drinks a lot of soda and high carb foods.  Medically stable for discharge home today.  Following problems were addressed during showers hospitalization:  Severe hyperglycemia/HHNK: Known history of uncontrolled diabetes type 2.  Last hemoglobin A1c of 11.5 as per 12/08/2021.  At least 5 admissions since last 2 months.  Now transitioned to long-acting and sliding scale.  No evidence of DKA.   Continue current insulin regimen.  Diabetic coordinator consulted.Started on insulin drip.   Acute cystitis: Presented with dysuria, increased urinary frequency.  Started on ceftriaxone, follow-up cultures.  Patient is afebrile.denies any dysuria today.  Urine culture showed 20,000 colonies of Klebsiella, he completed 3 days course of antibiotics IV  Fall: Patient has right BKA, left AKA .ambulates with the help of wheelchair.   History of HIV: On  Davato,Darovirine   CKD stage IV: Baseline creatinine ranged from 3.3-3.8.  Currently kidney function at baseline.  We recommend follow-up with nephrology as an outpatient.   Hyperlipidemia: Continue Lipitor   Hypertension: Blood pressure stable now.  Takes Coreg, Norvasc, hydralazine, losartan at home.  Home medications resumed.   BPH: Continue finasteride   Anemia of chronic disease: Currently hemoglobin stable         Discharge Diagnoses:  Principal Problem:   Hyperglycemia Active Problems:   HTN (hypertension)   Hyperlipidemia   Insulin-requiring or dependent type II diabetes mellitus (HCC)   HIV disease (HCC)   CKD (chronic kidney disease) stage 4, GFR 15-29 ml/min (HCC)   BPH (benign prostatic hyperplasia)   Fall at home, initial encounter   Acute cystitis   History of anemia due to chronic kidney disease    Discharge Instructions  Discharge Instructions     Diet - low sodium heart healthy   Complete by: As directed    Discharge instructions   Complete by: As directed    1)Please take prescribed medications as instructed 2)Monitor your blood glucose at home 3)Follow up with your PCP in a week   Increase activity slowly   Complete by: As directed    No wound care   Complete by: As directed       Allergies as of 01/01/2022       Reactions   Elemental Sulfur Itching   Patient stated he's allergic to "sulfur" AND "sulfa"   Metformin And Related Other (See Comments)   Stopped by MD- affected kidneys   Zetia [ezetimibe] Nausea Only   Sulfa Antibiotics Itching        Medication List     STOP taking these medications  insulin aspart 100 UNIT/ML injection Commonly known as: novoLOG   Lantus 100 UNIT/ML injection Generic drug: insulin glargine Replaced by: insulin glargine 100 UNIT/ML Solostar Pen       TAKE these medications    allopurinol 100 MG tablet Commonly known as: ZYLOPRIM Take 100 mg by mouth daily.   amLODipine 10 MG  tablet Commonly known as: NORVASC Take 10 mg by mouth daily.   aspirin EC 81 MG tablet Take 81 mg by mouth daily. Swallow whole.   atorvastatin 80 MG tablet Commonly known as: LIPITOR Take 1 tablet (80 mg total) by mouth daily.   blood glucose meter kit and supplies Kit Dispense based on patient and insurance preference. Use up to four times daily as directed.   carvedilol 3.125 MG tablet Commonly known as: COREG Take 3.125 mg by mouth 2 (two) times daily with a meal.   clopidogrel 75 MG tablet Commonly known as: PLAVIX Take 1 tablet (75 mg total) by mouth daily.   cyclobenzaprine 5 MG tablet Commonly known as: FLEXERIL Take 5 mg by mouth 3 (three) times daily as needed for muscle spasms.   dapsone 100 MG tablet TAKE 1 TABLET (100 MG TOTAL) BY MOUTH DAILY.   docusate sodium 100 MG capsule Commonly known as: Colace Take 1 capsule (100 mg total) by mouth 2 (two) times daily as needed for mild constipation (take when no BM for 1 day.).   doravirine 100 MG Tabs tablet Commonly known as: PIFELTRO Take 1 tablet (100 mg total) by mouth daily.   Dovato 50-300 MG tablet Generic drug: dolutegravir-lamiVUDine Take 1 tablet by mouth daily.   escitalopram 20 MG tablet Commonly known as: LEXAPRO Take 1 tablet (20 mg total) by mouth daily.   ferrous sulfate 325 (65 FE) MG EC tablet Take 325 mg by mouth daily with breakfast.   ferrous sulfate 325 (65 FE) MG tablet Take 1 tablet (325 mg total) by mouth every Monday, Wednesday, and Friday at 6 PM. Do not take with other medicines.   finasteride 5 MG tablet Commonly known as: PROSCAR Take 5 mg by mouth daily.   gabapentin 100 MG capsule Commonly known as: NEURONTIN Take 100 mg by mouth 3 (three) times daily. What changed: Another medication with the same name was changed. Make sure you understand how and when to take each.   gabapentin 100 MG capsule Commonly known as: NEURONTIN Take 1 capsule (100 mg total) by mouth 3  (three) times daily. What changed: when to take this   HumaLOG KwikPen 100 UNIT/ML KwikPen Generic drug: insulin lispro Inject 0-15 Units into the skin See admin instructions. Inject 0-15 units into the skin three times a day with meals, per sliding scale: CBG 70 - 120: 0 units; CBG 121 - 150: 2 units; CBG 151 - 200: 3 units; CBG 201 - 250: 5 units; CBG 251 - 300: 8 units; CBG 301 - 350: 11 units; CBG 351 - 400: 15 units What changed: Another medication with the same name was added. Make sure you understand how and when to take each.   insulin lispro 100 UNIT/ML KwikPen Commonly known as: HumaLOG KwikPen Inject 10 Units into the skin 3 (three) times daily. What changed: You were already taking a medication with the same name, and this prescription was added. Make sure you understand how and when to take each.   hydrALAZINE 10 MG tablet Commonly known as: APRESOLINE Take 10 mg by mouth 2 (two) times daily.   hydrochlorothiazide 12.5 MG  tablet Commonly known as: HYDRODIURIL Take 12.5 mg by mouth daily.   insulin glargine 100 UNIT/ML Solostar Pen Commonly known as: LANTUS Inject 38 Units into the skin daily. Replaces: Lantus 100 UNIT/ML injection   losartan 50 MG tablet Commonly known as: COZAAR Take 50 mg by mouth daily.   metFORMIN 1000 MG tablet Commonly known as: GLUCOPHAGE Take 1,000 mg by mouth 2 (two) times daily.   nitroGLYCERIN 0.4 MG SL tablet Commonly known as: NITROSTAT Place 0.4 mg under the tongue every 5 (five) minutes as needed for chest pain.   onetouch ultrasoft lancets Use as instructed   oxyCODONE 15 MG immediate release tablet Commonly known as: ROXICODONE Take 15 mg by mouth 4 (four) times daily.   pregabalin 50 MG capsule Commonly known as: LYRICA Take 50 mg by mouth 2 (two) times daily.   sodium bicarbonate 650 MG tablet Take 1 tablet (650 mg total) by mouth 3 (three) times daily. What changed: when to take this        Follow-up Information      [name], NP. Schedule an appointment as soon as possible for a visit in 1 week(s).   Specialty: Adult Health Nurse Practitioner Contact information: [address] [place] [address] [phone]                Allergies  Allergen Reactions   Elemental Sulfur Itching    Patient stated he's allergic to "sulfur" AND "sulfa"   Metformin And Related Other (See Comments)    Stopped by MD- affected kidneys   Zetia [Ezetimibe] Nausea Only   Sulfa Antibiotics Itching    Consultations:    Procedures/Studies: DG Chest Port 1 View  Result Date: 12/29/2021 CLINICAL DATA:  Shortness of breath EXAM: PORTABLE CHEST 1 VIEW COMPARISON:  12/25/2021 FINDINGS: The heart size and mediastinal contours are within normal limits. Both lungs are clear. The visualized skeletal structures are unremarkable. IMPRESSION: No active disease. Electronically Signed   By: Rolm Baptise M.D.   On: 12/29/2021 22:05   CT ABDOMEN PELVIS WO CONTRAST  Result Date: 12/18/2021 CLINICAL DATA:  Three-week history of diarrhea.  Abdominal pain. EXAM: CT ABDOMEN AND PELVIS WITHOUT CONTRAST TECHNIQUE: Multidetector CT imaging of the abdomen and pelvis was performed following the standard protocol without IV contrast. RADIATION DOSE REDUCTION: This exam was performed according to the departmental dose-optimization program which includes automated exposure control, adjustment of the mA and/or kV according to patient size and/or use of iterative reconstruction technique. COMPARISON:  11/10/2020 FINDINGS: Lower chest: Stable small cluster of tree-in-bud nodularity in the peripheral right lower lobe is compatible sequelae of atypical infection, potentially residual scarring. Hepatobiliary: No suspicious focal abnormality in the liver on this study without intravenous contrast. Calcified gallstones measure up to 15 mm diameter. No intrahepatic or extrahepatic biliary dilation. Pancreas: No focal mass lesion. No  dilatation of the main duct. No intraparenchymal cyst. No peripancreatic edema. Spleen: No splenomegaly. No focal mass lesion. Adrenals/Urinary Tract: No adrenal nodule or mass. Kidneys unremarkable. No evidence for hydroureter. Relatively large volume gas is identified in the urinary bladder. There is apparent gas containing debris along the dependent mucosal surface of the bladder lumen (image 80/series 2). No substantial bladder wall thickening. Stomach/Bowel: Stomach is moderately distended with food and fluid. Duodenum is normally positioned as is the ligament of Treitz. No small bowel wall thickening. No small bowel dilatation. The terminal ileum is normal. The appendix is normal. Small to moderate volume gas and stool  identified in the colon. No colonic wall thickening. No pericolonic edema or inflammation. Vascular/Lymphatic: There is mild atherosclerotic calcification of the abdominal aorta without aneurysm. There is no gastrohepatic or hepatoduodenal ligament lymphadenopathy. No retroperitoneal or mesenteric lymphadenopathy. No pelvic sidewall lymphadenopathy. Reproductive: The prostate gland and seminal vesicles are unremarkable. Other: No intraperitoneal free fluid. Musculoskeletal: No worrisome lytic or sclerotic osseous abnormality. IMPRESSION: 1. Relatively large volume gas in the urinary bladder with apparent gas containing debris along the dependent mucosal surface of the bladder lumen. Imaging features may be related to recent instrumentation, bladder infection is a consideration based on imaging. 2. No specific GI findings to explain the patient's history of diarrhea. 3. Cholelithiasis. 4. Stable small cluster of tree-in-bud nodularity in the peripheral right lower lobe is compatible with sequelae of atypical infection, potentially residual scarring. 5. Aortic Atherosclerosis (ICD10-I70.0) and Emphysema (ICD10-J43.9). Electronically Signed   By: Misty Stanley M.D.   On: 12/18/2021 05:17   DG Abd  1 View  Result Date: 12/07/2021 CLINICAL DATA:  A 56 year old male presents with nausea and vomiting. EXAM: ABDOMEN - 1 VIEW COMPARISON:  December of 2017. FINDINGS: Incidental imaging of the lung bases is unremarkable. Moderate stool and gas in the rectum. Scattered gas-filled loops of colon also with mild colonic stool in the area of the ascending colon. Due to patient body habitus a portion of the RIGHT flank is excluded. There is scattered gas-filled loops of small bowel as well but without substantial distension. On limited assessment there is no acute skeletal process IMPRESSION: Query mild constipation. Scattered gas-filled loops of small bowel may reflect mild ileus. No signs of obstruction. RIGHT flank partially excluded from view on today's evaluation. Correlate with any localizing symptoms in this area or signs of hernia. Could consider repeat imaging as warranted. Electronically Signed   By: Zetta Bills M.D.   On: 12/07/2021 09:52      Subjective: Patient seen and examined at bedside today.  Hemodynamically stable for discharge.  Discharge Exam: Vitals:   12/31/21 2129 01/01/22 0455  BP: (!) 144/114 (!) 143/84  Pulse: 69 70  Resp: 16 18  Temp: 98.4 F (36.9 C) 98.4 F (36.9 C)  SpO2: 100% 98%   Vitals:   12/31/21 1420 12/31/21 2129 01/01/22 0455 01/01/22 0457  BP: (!) 141/86 (!) 144/114 (!) 143/84   Pulse: 70 69 70   Resp: _0 Temp: 97.9 F (36.6 C) 98.4 F (36.9 C) 98.4 F (36.9 C)   TempSrc: Oral Oral Oral   SpO2: 100% 100% 98%   Weight:    122.6 kg  Height:        General: Pt is alert, awake, not in acute distress Cardiovascular: RRR, S1/S2 +, no rubs, no gallops Respiratory: CTA bilaterally, no wheezing, no rhonchi Abdominal: Soft, NT, ND, bowel sounds + Extremities: right BKA,left AKA, no cyanosis    The results of significant diagnostics from this hospitalization (including imaging, microbiology, ancillary and laboratory) are listed below for  reference.     Microbiology: Recent Results (from the past 240 hour(s))  Urine Culture     Status: Abnormal (Preliminary result)   Collection Time: 12/29/21  6:50 PM   Specimen: Urine, Clean Catch  Result Value Ref Range Status   Specimen Description   Final    URINE, CLEAN CATCH Performed at [facility], [place] [address] [place], [place] [address]    Special Requests   Final    NONE Performed at [facility], [place] [address], [place], [place] [address]    Culture (A)  Final    20,000 COLONIES/mL KLEBSIELLA PNEUMONIAE SUSCEPTIBILITIES TO FOLLOW Performed at [facility], [place] [address], [place], [place] [address]    Report Status PENDING  Incomplete  MRSA Next Gen by PCR, Nasal     Status: None   Collection Time: 12/29/21  8:53 PM   Specimen: Nasal Mucosa; Nasal Swab  Result Value Ref Range Status   MRSA by PCR Next Gen NOT DETECTED NOT DETECTED Final    Comment: (NOTE) The GeneXpert MRSA Assay (FDA approved for NASAL specimens only), is one component of a comprehensive MRSA colonization surveillance program. It is not intended to diagnose MRSA infection nor to guide or monitor treatment for MRSA infections. Test performance is not FDA approved in patients less than 2 years old. Performed at [facility], [place] [address], [place], [place] [address]      Labs: BNP (last 3 results) No results for input(s): "BNP" in the last 8760 hours. Basic Metabolic Panel: Recent Labs  Lab 12/29/21 1725 12/29/21 2129 12/30/21 0034 12/30/21 0843  NA 126* 133* 134* 136  K 4.4 3.6 3.7 3.5  CL 95* 101 103 105  CO2 21* _0 GLUCOSE 774* 392* 141* 165*  BUN 35* 32* 32* 29*  CREATININE 3.32* 3.14* 2.79* 2.75*  CALCIUM 8.9 9.0 8.8* 8.6*  MG 1.9  --  1.7  --   PHOS  --   --  4.2  --    Liver Function Tests: Recent Labs  Lab 12/29/21 1725 12/30/21 0034  AST 20 14*  ALT 19 16  ALKPHOS 129* 102  BILITOT 0.6 0.6   PROT 7.9 7.1  ALBUMIN 3.1* 2.8*   Recent Labs  Lab 12/29/21 1725  LIPASE 35   No results for input(s): "AMMONIA" in the last 168 hours. CBC: Recent Labs  Lab 12/29/21 1725 12/30/21 0034  WBC 9.4 9.0  NEUTROABS  --  6.3  HGB 10.6* 10.0*  HCT 33.1* 31.4*  MCV 90.9 90.0  PLT 313 292   Cardiac Enzymes: No results for input(s): "CKTOTAL", "CKMB", "CKMBINDEX", "TROPONINI" in the last 168 hours. BNP: Invalid input(s): "POCBNP" CBG: Recent Labs  Lab 12/31/21 1223 12/31/21 1642 12/31/21 2125 01/01/22 0735 01/01/22 0904  GLUCAP 275* 282* 329* 441* 358*   D-Dimer No results for input(s): "DDIMER" in the last 72 hours. Hgb A1c No results for input(s): "HGBA1C" in the last 72 hours. Lipid Profile No results for input(s): "CHOL", "HDL", "LDLCALC", "TRIG", "CHOLHDL", "LDLDIRECT" in the last 72 hours. Thyroid function studies No results for input(s): "TSH", "T4TOTAL", "T3FREE", "THYROIDAB" in the last 72 hours.  Invalid input(s): "FREET3" Anemia work up No results for input(s): "VITAMINB12", "FOLATE", "FERRITIN", "TIBC", "IRON", "RETICCTPCT" in the last 72 hours. Urinalysis    Component Value Date/Time   COLORURINE STRAW (A) 12/29/2021 1850   APPEARANCEUR HAZY (A) 12/29/2021 1850   LABSPEC 1.013 12/29/2021 1850   PHURINE 6.0 12/29/2021 1850   GLUCOSEU >=500 (A) 12/29/2021 1850   HGBUR NEGATIVE 12/29/2021 1850   BILIRUBINUR NEGATIVE 12/29/2021 1850   BILIRUBINUR negative 09/07/2018 1034   KETONESUR NEGATIVE 12/29/2021 1850   PROTEINUR 100 (A) 12/29/2021 1850   UROBILINOGEN 1.0 09/07/2018 1034   UROBILINOGEN 1.0 01/01/2017 1032   NITRITE NEGATIVE 12/29/2021 1850   LEUKOCYTESUR LARGE (A) 12/29/2021 1850   Sepsis Labs Recent Labs  Lab 12/29/21 1725 12/30/21 0034  WBC 9.4 9.0   Microbiology  Recent Results (from the past 240 hour(s))  Urine Culture     Status: Abnormal (Preliminary result)   Collection Time: 12/29/21  6:50 PM   Specimen: Urine, Clean Catch   Result Value Ref Range Status   Specimen Description   Final    URINE, CLEAN CATCH Performed at [facility], [place] [address], [place], [place] [address]    Special Requests   Final    NONE Performed at [facility] [place] [address] [place], [place] [address]    Culture (A)  Final    20,000 COLONIES/mL KLEBSIELLA PNEUMONIAE SUSCEPTIBILITIES TO FOLLOW Performed at [facility], [place] [address], [place], [place] [address]    Report Status PENDING  Incomplete  MRSA Next Gen by PCR, Nasal     Status: None   Collection Time: 12/29/21  8:53 PM   Specimen: Nasal Mucosa; Nasal Swab  Result Value Ref Range Status   MRSA by PCR Next Gen NOT DETECTED NOT DETECTED Final    Comment: (NOTE) The GeneXpert MRSA Assay (FDA approved for NASAL specimens only), is one component of a comprehensive MRSA colonization surveillance program. It is not intended to diagnose MRSA infection nor to guide or monitor treatment for MRSA infections. Test performance is not FDA approved in patients less than 53 years old. Performed at [facility], [place] [address], [place], [place] [address]     Please note: You were cared for by a hospitalist during your hospital stay. Once you are discharged, your primary care physician will handle any further medical issues. Please note that NO REFILLS for any discharge medications will be authorized once you are discharged, as it is imperative that you return to your primary care physician (or establish a relationship with a primary care physician if you do not have one) for your post hospital discharge needs so that they can reassess your need for medications and monitor your lab values.    Time coordinating discharge: 40 minutes  SIGNED:   Shelly Coss, MD  [facility] 01/01/2022, 11:05 AM Pager [phone]  If 7PM-7AM, please contact night-coverage [email or web address] Password [email or web address]

## 2022-01-01 NOTE — TOC Transition Note (Signed)
Transition of Care [facility]) - CM/SW Discharge Note   Patient Details  Name: Keith [name] MRN: [id number] Date of Birth: [birth year]  Transition of Care [facility]) CM/SW Contact:  Ross Ludwig, LCSW Phone Number: 01/01/2022, 1:41 PM   Clinical Narrative:     CSW spoke to patient and he needs ambulance transport back to the [place], [place], [place] [address] room 136, where he has been staying for several weeks.  Patient does not have his wheelchair or prosthetics with him and unable to transport via wheelchair van.  Patient is currently open to [facility] PT, Aide, and social work, CSW updated Caryl Pina that patient is discharging today.    Final next level of care: Home/Self Care Barriers to Discharge: Continued Medical Work up   Patient Goals and CMS Choice Patient states their goals for this hospitalization and ongoing recovery are:: To return back to the hotel he is staying at.      Discharge Placement                       Discharge Plan and Services                                     Social Determinants of Health (SDOH) Interventions     Readmission Risk Interventions    01/01/2022   11:54 AM 10/10/2021    1:31 PM 10/21/2020   11:53 AM  Readmission Risk Prevention Plan  Transportation Screening Complete Complete Complete  Medication Review ([place]) Referral to Pharmacy Complete Complete  PCP or Specialist appointment within 3-5 days of discharge Complete Complete Complete  HRI or Home Care Consult Complete Complete Complete  SW Recovery Care/Counseling Consult Complete Complete Complete  Palliative Care Screening Not Applicable Not Applicable Not Applicable  [place] Not Applicable Not Applicable Complete

## 2022-01-01 NOTE — Progress Notes (Signed)
Patient was given discharge instructions, and all questions were answered. Patient was stable for discharge and was taken to his place of residence by [facility].

## 2022-01-01 NOTE — Progress Notes (Signed)
Patient's CBG was 441. Dr. Tawanna Solo was contacted and order an additional 10 units for patient. Will recheck CBG's at 0900.

## 2022-01-02 ENCOUNTER — Other Ambulatory Visit (HOSPITAL_COMMUNITY): Payer: Self-pay

## 2022-01-02 LAB — URINE CULTURE: Culture: 20000 — AB

## 2022-01-04 ENCOUNTER — Other Ambulatory Visit (HOSPITAL_COMMUNITY): Payer: Self-pay

## 2022-01-10 ENCOUNTER — Other Ambulatory Visit: Payer: Self-pay

## 2022-01-10 ENCOUNTER — Encounter (HOSPITAL_COMMUNITY): Payer: Self-pay

## 2022-01-10 ENCOUNTER — Emergency Department (HOSPITAL_COMMUNITY)
Admission: EM | Admit: 2022-01-10 | Discharge: 2022-01-11 | Disposition: A | Discharge status: Home / Self Care | Payer: Medicare Other | Attending: Emergency Medicine | Admitting: Emergency Medicine

## 2022-01-10 DIAGNOSIS — Z7982 Long term (current) use of aspirin: Secondary | ICD-10-CM | POA: Insufficient documentation

## 2022-01-10 DIAGNOSIS — B20 Human immunodeficiency virus [HIV] disease: Secondary | ICD-10-CM | POA: Diagnosis not present

## 2022-01-10 DIAGNOSIS — N185 Chronic kidney disease, stage 5: Secondary | ICD-10-CM | POA: Insufficient documentation

## 2022-01-10 DIAGNOSIS — Z794 Long term (current) use of insulin: Secondary | ICD-10-CM | POA: Diagnosis not present

## 2022-01-10 DIAGNOSIS — I2510 Atherosclerotic heart disease of native coronary artery without angina pectoris: Secondary | ICD-10-CM | POA: Insufficient documentation

## 2022-01-10 DIAGNOSIS — Z7902 Long term (current) use of antithrombotics/antiplatelets: Secondary | ICD-10-CM | POA: Diagnosis not present

## 2022-01-10 DIAGNOSIS — E1122 Type 2 diabetes mellitus with diabetic chronic kidney disease: Secondary | ICD-10-CM | POA: Diagnosis not present

## 2022-01-10 DIAGNOSIS — E1165 Type 2 diabetes mellitus with hyperglycemia: Secondary | ICD-10-CM | POA: Insufficient documentation

## 2022-01-10 DIAGNOSIS — Z7984 Long term (current) use of oral hypoglycemic drugs: Secondary | ICD-10-CM | POA: Insufficient documentation

## 2022-01-10 DIAGNOSIS — N3000 Acute cystitis without hematuria: Secondary | ICD-10-CM | POA: Insufficient documentation

## 2022-01-10 DIAGNOSIS — R739 Hyperglycemia, unspecified: Secondary | ICD-10-CM | POA: Diagnosis present

## 2022-01-10 DIAGNOSIS — Z79899 Other long term (current) drug therapy: Secondary | ICD-10-CM | POA: Diagnosis not present

## 2022-01-10 LAB — CBC
HCT: 35.2 % — ABNORMAL LOW (ref 39.0–52.0)
Hemoglobin: 11 g/dL — ABNORMAL LOW (ref 13.0–17.0)
MCH: 28.9 pg (ref 26.0–34.0)
MCHC: 31.3 g/dL (ref 30.0–36.0)
MCV: 92.6 fL (ref 80.0–100.0)
Platelets: 297 10*3/uL (ref 150–400)
RBC: 3.8 MIL/uL — ABNORMAL LOW (ref 4.22–5.81)
RDW: 13.4 % (ref 11.5–15.5)
WBC: 7.8 10*3/uL (ref 4.0–10.5)
nRBC: 0 % (ref 0.0–0.2)

## 2022-01-10 LAB — BASIC METABOLIC PANEL
Anion gap: 6 (ref 5–15)
BUN: 38 mg/dL — ABNORMAL HIGH (ref 6–20)
CO2: 22 mmol/L (ref 22–32)
Calcium: 8.5 mg/dL — ABNORMAL LOW (ref 8.9–10.3)
Chloride: 107 mmol/L (ref 98–111)
Creatinine, Ser: 3.26 mg/dL — ABNORMAL HIGH (ref 0.61–1.24)
GFR, Estimated: 21 mL/min — ABNORMAL LOW (ref >60)
Glucose, Bld: 277 mg/dL — ABNORMAL HIGH (ref 70–99)
Potassium: 5 mmol/L (ref 3.5–5.1)
Sodium: 135 mmol/L (ref 135–145)

## 2022-01-10 LAB — CBG MONITORING, ED: Glucose-Capillary: 269 mg/dL — ABNORMAL HIGH (ref 70–99)

## 2022-01-10 NOTE — ED Triage Notes (Signed)
To ED POV with c/o hyperglycemia. States his sugar was in the 400s this AM, in the 500s this after afternoon. Reports taking 30 units of long acting this AM and 10 units of humalog with meals, last dose before he left to come to ED.

## 2022-01-10 NOTE — ED Provider Triage Note (Signed)
Emergency Medicine Provider Triage Evaluation Note  Keith [name] , a 56 y.o. male  was evaluated in triage.  Patient complains of hyperglycemia.  Reports compliance with his diabetes medications.  Says that it has been in the 400s and 500s   Review of Systems  Positive:  Negative:   Physical Exam  BP (!) 138/91 (BP Location: Left Arm)   Pulse 73   Temp 98.2 F (36.8 C) (Oral)   Resp 18   Ht 5\' 10"  (1.778 m)   Wt 122.6 kg   SpO2 97%   BMI 38.78 kg/m  Gen:   Awake, no distress   Resp:  Normal effort  MSK:   Moves extremities without difficulty  Other:    Medical Decision Making  Medically screening exam initiated at 9:18 PM.  Appropriate orders placed.  Keith [name] was informed that the remainder of the evaluation will be completed by another provider, this initial triage assessment does not replace that evaluation, and the importance of remaining in the ED until their evaluation is complete.     Rhae Hammock, [place] 01/10/22 2119

## 2022-01-11 DIAGNOSIS — E1165 Type 2 diabetes mellitus with hyperglycemia: Secondary | ICD-10-CM | POA: Diagnosis not present

## 2022-01-11 LAB — CBG MONITORING, ED
Glucose-Capillary: 303 mg/dL — ABNORMAL HIGH (ref 70–99)
Glucose-Capillary: 236 mg/dL — ABNORMAL HIGH (ref 70–99)

## 2022-01-11 LAB — URINALYSIS, ROUTINE W REFLEX MICROSCOPIC
Bilirubin Urine: NEGATIVE
Glucose, UA: >=500 mg/dL — AB
Ketones, ur: NEGATIVE mg/dL
Nitrite: NEGATIVE
Protein, ur: >=300 mg/dL — AB
Specific Gravity, Urine: 1.012 (ref 1.005–1.030)
pH: 5 (ref 5.0–8.0)

## 2022-01-11 MED ORDER — LACTATED RINGERS IV BOLUS
500.0000 mL | Freq: Once | INTRAVENOUS | Status: AC
  Administered 2022-01-11: 500 mL via INTRAVENOUS

## 2022-01-11 MED ORDER — CEPHALEXIN 500 MG PO CAPS: 500.0000 mg | ORAL_CAPSULE | Freq: Four times a day (QID) | ORAL | 0 refills | Status: AC

## 2022-01-11 MED ORDER — INSULIN ASPART 100 UNIT/ML IJ SOLN
4.0000 [IU] | Freq: Once | INTRAMUSCULAR | Status: AC
  Administered 2022-01-11: 4 [IU] via SUBCUTANEOUS
  Filled 2022-01-11: qty 0.04

## 2022-01-11 NOTE — ED Provider Notes (Addendum)
[facility] DEPT Provider Note   CSN: [id number] Arrival date & time: 01/10/22  2046     History  Chief Complaint  Patient presents with   Hyperglycemia    Keith [name] is a 56 y.o. male.  56 year old male with past medical history significant for hypertension, hyperlipidemia, HIV, diabetes, bilateral lower extremity amputation 1 BKA, 1 AKA presents today for evaluation of elevated blood sugars.  He stated readings of 400 just prior to arrival.  He gave himself 10 units of short acting insulin and presented to the emergency room.  States he does not routinely check his blood sugars.  Prior to yesterday he states he had not checked his blood sugars for about a week.  He does report compliance with his insulin regimen.  He denies any abdominal pain, nausea, vomiting, lightheadedness, chest pain, shortness of breath, or other complaints at this time.  The history is provided by the patient. No language interpreter was used.       Home Medications Prior to Admission medications   Medication Sig Start Date End Date Taking? Authorizing Provider  Accu-Chek Softclix Lancets lancets Use as directed to check blood glucose 4 times a day. 01/01/22   Shelly Coss, MD  allopurinol (ZYLOPRIM) 100 MG tablet Take 100 mg by mouth daily.    [provider]  amLODipine (NORVASC) 10 MG tablet Take 10 mg by mouth daily.    [provider]  aspirin EC 81 MG tablet Take 81 mg by mouth daily. Swallow whole.    [provider]  atorvastatin (LIPITOR) 80 MG tablet Take 1 tablet (80 mg total) by mouth daily. 09/01/20 01/02/22  [name], MD  blood glucose meter kit and supplies KIT Use to check blood glucose up to four times daily as directed. 01/01/22   Shelly Coss, MD  carvedilol (COREG) 3.125 MG tablet Take 3.125 mg by mouth 2 (two) times daily with a meal.    [provider]  clopidogrel (PLAVIX) 75 MG tablet Take 1 tablet (75 mg  total) by mouth daily. 11/15/21   Jettie Booze, MD  cyclobenzaprine (FLEXERIL) 5 MG tablet Take 5 mg by mouth 3 (three) times daily as needed for muscle spasms. 12/20/21   [provider]  dapsone 100 MG tablet TAKE 1 TABLET (100 MG TOTAL) BY MOUTH DAILY. Patient not taking: Reported on 12/30/2021 02/13/21 02/13/22  Golden Circle, FNP  docusate sodium (COLACE) 100 MG capsule Take 1 capsule (100 mg total) by mouth 2 (two) times daily as needed for mild constipation (take when no BM for 1 day.). Patient not taking: Reported on 12/18/2021 12/15/21 12/15/22  [name], MD  dolutegravir-lamiVUDine (DOVATO) 50-300 MG tablet Take 1 tablet by mouth daily. 02/13/21   Golden Circle, FNP  doravirine (PIFELTRO) 100 MG TABS tablet Take 1 tablet (100 mg total) by mouth daily. 02/13/21   Golden Circle, FNP  escitalopram (LEXAPRO) 20 MG tablet Take 1 tablet (20 mg total) by mouth daily. 10/19/21   Hosie Poisson, MD  ferrous sulfate 325 (65 FE) MG EC tablet Take 325 mg by mouth daily with breakfast.    [provider]  ferrous sulfate 325 (65 FE) MG tablet Take 1 tablet (325 mg total) by mouth every Monday, Wednesday, and Friday at 6 PM. Do not take with other medicines. 10/19/21 12/18/21  Hosie Poisson, MD  finasteride (PROSCAR) 5 MG tablet Take 5 mg by mouth daily. 08/20/21   [provider]  gabapentin (NEURONTIN) 100 MG capsule Take 1 capsule (100 mg total) by mouth 3 (three) times daily. Patient taking differently: Take 100 mg by mouth 2 (two) times daily. 10/19/21 12/18/21  Hosie Poisson, MD  gabapentin (NEURONTIN) 100 MG capsule Take 100 mg by mouth 3 (three) times daily.    [provider]  glucose blood (ACCU-CHEK GUIDE) test strip Use as directed to check blood glucose 4 times a day. 01/01/22   Shelly Coss, MD  HUMALOG KWIKPEN 100 UNIT/ML KwikPen Inject 0-15 Units into the skin See admin instructions. Inject 0-15 units into the skin three times a day with  meals, per sliding scale: CBG 70 - 120: 0 units; CBG 121 - 150: 2 units; CBG 151 - 200: 3 units; CBG 201 - 250: 5 units; CBG 251 - 300: 8 units; CBG 301 - 350: 11 units; CBG 351 - 400: 15 units    [provider]  hydrALAZINE (APRESOLINE) 10 MG tablet Take 10 mg by mouth 2 (two) times daily.    [provider]  hydrochlorothiazide (HYDRODIURIL) 12.5 MG tablet Take 12.5 mg by mouth daily.    [provider]  insulin glargine (LANTUS) 100 UNIT/ML Solostar Pen Inject 38 Units into the skin daily. 01/01/22   Shelly Coss, MD  insulin lispro (HUMALOG KWIKPEN) 100 UNIT/ML KwikPen Inject 10 Units into the skin 3 (three) times daily. 01/01/22   Shelly Coss, MD  Insulin Pen Needle (UNIFINE PENTIPS) 32G X 6 MM MISC Use up to 4 pen needles per day as directed. 01/01/22   Shelly Coss, MD  Lancets [facility] ULTRASOFT) lancets Use as instructed 02/16/20   [name], FNP  losartan (COZAAR) 50 MG tablet Take 50 mg by mouth daily.    [provider]  metFORMIN (GLUCOPHAGE) 1000 MG tablet Take 1,000 mg by mouth 2 (two) times daily. 12/21/21   [provider]  nitroGLYCERIN (NITROSTAT) 0.4 MG SL tablet Place 0.4 mg under the tongue every 5 (five) minutes as needed for chest pain.    [provider]  oxyCODONE (ROXICODONE) 15 MG immediate release tablet Take 15 mg by mouth 4 (four) times daily. 12/20/21   [provider]  pregabalin (LYRICA) 50 MG capsule Take 50 mg by mouth 2 (two) times daily. 12/20/21   [provider]  sodium bicarbonate 650 MG tablet Take 1 tablet (650 mg total) by mouth 3 (three) times daily. Patient taking differently: Take 650 mg by mouth 2 (two) times daily. 12/15/21   [name], MD      Allergies    Elemental sulfur, Metformin and related, Zetia [ezetimibe], and Sulfa antibiotics    Review of Systems   Review of Systems  Constitutional:  Negative for chills, fatigue and fever.  Respiratory:  Negative  for shortness of breath.   Cardiovascular:  Negative for chest pain and leg swelling.  Gastrointestinal:  Negative for abdominal pain and nausea.  Genitourinary:  Negative for dysuria.  Neurological:  Negative for light-headedness.  All other systems reviewed and are negative.   Physical Exam Updated Vital Signs BP (!) 162/107 (BP Location: Left Arm)   Pulse 68   Temp 98.2 F (36.8 C) (Oral)   Resp 18   Ht _0  (1.778 m)   Wt 122.6 kg   SpO2 95%   BMI 38.78 kg/m  Physical Exam Vitals and nursing note reviewed.  Constitutional:      General: He is not in acute distress.    Appearance: Normal appearance.  He is not ill-appearing.  HENT:     Head: Normocephalic and atraumatic.     Nose: Nose normal.  Eyes:     Conjunctiva/sclera: Conjunctivae normal.  Cardiovascular:     Rate and Rhythm: Normal rate and regular rhythm.  Pulmonary:     Effort: Pulmonary effort is normal. No respiratory distress.  Abdominal:     General: There is no distension.     Palpations: Abdomen is soft.     Tenderness: There is no abdominal tenderness. There is no guarding.  Musculoskeletal:        General: No deformity. Normal range of motion.     Cervical back: Normal range of motion.  Skin:    Findings: No rash.  Neurological:     Mental Status: He is alert.     ED Results / Procedures / Treatments   Labs (all labs ordered are listed, but only abnormal results are displayed) Labs Reviewed  BASIC METABOLIC PANEL - Abnormal; Notable for the following components:      Result Value   Glucose, Bld 277 (*)    BUN 38 (*)    Creatinine, Ser 3.26 (*)    Calcium 8.5 (*)    GFR, Estimated 21 (*)    All other components within normal limits  CBC - Abnormal; Notable for the following components:   RBC 3.80 (*)    Hemoglobin 11.0 (*)    HCT 35.2 (*)    All other components within normal limits  CBG MONITORING, ED - Abnormal; Notable for the following components:   Glucose-Capillary 269 (*)     All other components within normal limits  CBG MONITORING, ED - Abnormal; Notable for the following components:   Glucose-Capillary 303 (*)    All other components within normal limits  URINALYSIS, ROUTINE W REFLEX MICROSCOPIC    EKG None  Radiology No results found.  Procedures Procedures    Medications Ordered in ED Medications - No data to display  ED Course/ Medical Decision Making/ A&P                           Medical Decision Making Amount and/or Complexity of Data Reviewed Labs: ordered.  Risk Prescription drug management.   Medical Decision Making / ED Course   This patient presents to the ED for concern of elevated blood sugar, this involves an extensive number of treatment options, and is a complaint that carries with it a high risk of complications and morbidity.  The differential diagnosis includes hyperglycemia, DKA, HHS  MDM: 56 year old male with past medical history as noted above presents today for elevated blood sugar.  Reports reading of about 400 earlier today.  He gave himself 10 units of insulin and presented to the emergency room.  He is asymptomatic.  Blood sugar on arrival was 269.  After my interview his blood sugar was rechecked and that increased to 303.  No anion gap.  No concern for DKA or HHS.  Mild renal insufficiency with creatinine 3.26.  Will provide 500 mL lactated ringer bolus, and 4 units of insulin and reevaluate.  Repeat glucose of 236.  Patient is appropriate for discharge.  Discharged in stable condition.  UA shows signs of UTI with trace leukocytes, 21-50 UA, and many bacteria.  Will start on antibiotics, and send urine culture.  He denies dysuria, or flank pain. We discussed consistently checking blood sugars and follow-up with PCP to adjust insulin regimen as needed.  Additional history obtained: -Additional history obtained from previous admission where patient had acute cystitis, urine culture showed 20,000 colonies of  Klebsiella.  He completed 3 days of IV antibiotics. -External records from outside source obtained and reviewed including: Chart review including previous notes, labs, imaging, consultation notes   Lab Tests: -I ordered, reviewed, and interpreted labs.   The pertinent results include:   Labs Reviewed  BASIC METABOLIC PANEL - Abnormal; Notable for the following components:      Result Value   Glucose, Bld 277 (*)    BUN 38 (*)    Creatinine, Ser 3.26 (*)    Calcium 8.5 (*)    GFR, Estimated 21 (*)    All other components within normal limits  CBC - Abnormal; Notable for the following components:   RBC 3.80 (*)    Hemoglobin 11.0 (*)    HCT 35.2 (*)    All other components within normal limits  CBG MONITORING, ED - Abnormal; Notable for the following components:   Glucose-Capillary 269 (*)    All other components within normal limits  CBG MONITORING, ED - Abnormal; Notable for the following components:   Glucose-Capillary 303 (*)    All other components within normal limits  URINALYSIS, ROUTINE W REFLEX MICROSCOPIC      EKG  EKG Interpretation  Date/Time:    Ventricular Rate:    PR Interval:    QRS Duration:   QT Interval:    QTC Calculation:   R Axis:     Text Interpretation:          Medicines ordered and prescription drug management: No orders of the defined types were placed in this encounter.   -I have reviewed the patients home medicines and have made adjustments as needed  Critical interventions IV fluids, insulin  Reevaluation: After the interventions noted above, I reevaluated the patient and found that they have :improved  Co morbidities that complicate the patient evaluation  Past Medical History:  Diagnosis Date   Abscess of right foot    abscess/ulcer of R transtibial amputation requiring IV abx   Acute ST elevation myocardial infarction (STEMI) due to occlusion of circumflex coronary artery ([place]) 03/22/2020   AIDS (HCC)    Anemia    CAD  (coronary artery disease)    a. MI with stenting of OM1 in 11/2018 with residual disease. b. acute STEMI 03/2020 s/p DES to OM1   Chronic anemia    Chronic knee pain    right   Chronic pain    CKD (chronic kidney disease), stage IV (HCC)    CVA (cerebral vascular accident) ([facility]) 04/2020   Diabetes type 2, uncontrolled    HgA1c 17.6 (04/27/2010)   Diabetic foot ulcer ([place]) 01/2017   right foot   Dilatation of aorta (HCC)    Erectile dysfunction    Genital warts    GERD (gastroesophageal reflux disease)    History of blood transfusion    HIV (human immunodeficiency virus infection) ([place]) 2009   CD4 count 100, VL 13800 (05/01/2010)   Hyperlipidemia    Hypertension    Myocardial infarction ([place])    Neuropathy    Noncompliance with medication regimen    Osteomyelitis ([place])    h/o hand   Osteomyelitis of metatarsal ([place]) 04/28/2017   Pneumonia    STEMI (ST elevation myocardial infarction) ([place]) 11/10/2018      Dispostion: Patient is appropriate for discharge.  Discharged in stable condition.  Return precaution discussed.  Patient voices understanding  and is in agreement with plan.  Final Clinical Impression(s) / ED Diagnoses Final diagnoses:  Hyperglycemia  Acute cystitis without hematuria    Rx / DC Orders ED Discharge Orders          Ordered    cephALEXin (KEFLEX) 500 MG capsule  4 times daily        01/11/22 [place], [place], PA-C 01/11/22 0454    Evlyn Courier, PA-C 01/11/22 2257    Veryl Speak, MD 01/11/22 [phone]

## 2022-01-11 NOTE — Discharge Instructions (Signed)
Your exam and work-up overall reassuring.  No signs of DKA.  You received 4 units of insulin, and fluids.  Your blood sugar improved to 236.  Continue her home insulin regimen.  Follow-up with your primary care provider to have this adjusted.  Check your blood sugars consistently.  Your urine showed some signs of UTI.  I have sent a urine culture.  In the meantime I have given you a prescription for antibiotic.  For any concerning symptoms return to the emergency room.

## 2022-01-13 LAB — URINE CULTURE: Culture: >=100000 — AB

## 2022-01-14 ENCOUNTER — Telehealth (HOSPITAL_BASED_OUTPATIENT_CLINIC_OR_DEPARTMENT_OTHER): Payer: Self-pay | Admitting: Emergency Medicine

## 2022-01-14 NOTE — Telephone Encounter (Signed)
Post ED Visit - Positive Culture Follow-up  Culture report reviewed by antimicrobial stewardship pharmacist: [facility] Team []  Elenor Quinones, Pharm.D. []  Heide Guile, Pharm.D., BCPS AQ-ID []  Parks Neptune, Pharm.D., BCPS []  Alycia Rossetti, Pharm.D., BCPS []  [place], [place].D., BCPS, AAHIVP []  Legrand Como, Pharm.D., BCPS, AAHIVP []  Salome Arnt, PharmD, BCPS []  Johnnette Gourd, PharmD, BCPS []  [name] Better, PharmD, BCPS []  Leeroy Cha, PharmD []  Laqueta Linden, PharmD, BCPS []  Albertina Parr, PharmD  [place] Team []  Leodis Sias, PharmD []  Lindell Spar, PharmD []  Royetta Asal, PharmD []  Graylin Shiver, Rph []  Rema Fendt) Glennon Mac, PharmD []  Arlyn Dunning, PharmD []  Netta Cedars, PharmD []  Dia Sitter, PharmD []  Leone Haven, PharmD []  Gretta Arab, PharmD []  Theodis Shove, PharmD []  Peggyann Juba, PharmD []  Reuel Boom, PharmD Francena Hanly PharmD   Positive urine culture Treated with cephalexin, thought to be chronic colonization and no further patient follow-up is required at this time.  Hazle Nordmann 01/14/2022, 10:04 AM

## 2022-01-14 NOTE — Progress Notes (Signed)
ED Antimicrobial Stewardship Positive Culture Follow Up   Keith [name] is an 56 y.o. male who presented to [name] on 01/10/2022 with a chief complaint of  Chief Complaint  Patient presents with   Hyperglycemia    Recent Results (from the past 720 hour(s))  Gastrointestinal Panel by PCR , Stool     Status: Abnormal   Collection Time: 12/17/21 10:12 PM   Specimen: Stool  Result Value Ref Range Status   Campylobacter species NOT DETECTED NOT DETECTED Final   Plesimonas shigelloides NOT DETECTED NOT DETECTED Final   Salmonella species NOT DETECTED NOT DETECTED Final   Yersinia enterocolitica NOT DETECTED NOT DETECTED Final   Vibrio species NOT DETECTED NOT DETECTED Final   Vibrio cholerae NOT DETECTED NOT DETECTED Final   Enteroaggregative E coli (EAEC) NOT DETECTED NOT DETECTED Final   Enteropathogenic E coli (EPEC) DETECTED (A) NOT DETECTED Final    Comment: RESULT CALLED TO, READ BACK BY AND VERIFIED WITH: CHRISTINA BAYNES RN [id number] 12/18/21 HNM    Enterotoxigenic E coli (ETEC) NOT DETECTED NOT DETECTED Final   Shiga like toxin producing E coli (STEC) NOT DETECTED NOT DETECTED Final   Shigella/Enteroinvasive E coli (EIEC) NOT DETECTED NOT DETECTED Final   Cryptosporidium NOT DETECTED NOT DETECTED Final   Cyclospora cayetanensis NOT DETECTED NOT DETECTED Final   Entamoeba histolytica NOT DETECTED NOT DETECTED Final   Giardia lamblia NOT DETECTED NOT DETECTED Final   Adenovirus F40/41 NOT DETECTED NOT DETECTED Final   Astrovirus NOT DETECTED NOT DETECTED Final   Norovirus GI/GII NOT DETECTED NOT DETECTED Final   Rotavirus A NOT DETECTED NOT DETECTED Final   Sapovirus (I, II, IV, and V) NOT DETECTED NOT DETECTED Final    Comment: Performed at [facility], [address] [place], [place] [address]  MRSA Next Gen by PCR, Nasal     Status: None   Collection Time: 12/18/21  8:31 PM   Specimen: Nasal Mucosa; Nasal Swab  Result Value Ref Range Status   MRSA by PCR Next  Gen NOT DETECTED NOT DETECTED Final    Comment: (NOTE) The GeneXpert MRSA Assay (FDA approved for NASAL specimens only), is one component of a comprehensive MRSA colonization surveillance program. It is not intended to diagnose MRSA infection nor to guide or monitor treatment for MRSA infections. Test performance is not FDA approved in patients less than 62 years old. Performed at [facility], [place] [address], [place], [place] [address]   Urine Culture     Status: Abnormal   Collection Time: 12/29/21  6:50 PM   Specimen: Urine, Clean Catch  Result Value Ref Range Status   Specimen Description   Final    URINE, CLEAN CATCH Performed at [facility], [place] [address] [place], [place] [address]    Special Requests   Final    NONE Performed at [facility], [place] [address], [place], [place] [address]    Culture (A)  Final    20,000 COLONIES/mL KLEBSIELLA PNEUMONIAE Confirmed Extended Spectrum Beta-Lactamase Producer (ESBL).  In bloodstream infections from ESBL organisms, carbapenems are preferred over piperacillin/tazobactam. They are shown to have a lower risk of mortality.    Report Status 01/02/2022 FINAL  Final   Organism ID, Bacteria KLEBSIELLA PNEUMONIAE (A)  Final      Susceptibility   Klebsiella pneumoniae - MIC*    AMPICILLIN >=32 RESISTANT Resistant     CEFAZOLIN >=64 RESISTANT Resistant     CEFEPIME >=32  RESISTANT Resistant     CEFTRIAXONE >=64 RESISTANT Resistant     CIPROFLOXACIN 1 RESISTANT Resistant     GENTAMICIN >=16 RESISTANT Resistant     IMIPENEM <=0.25 SENSITIVE Sensitive     NITROFURANTOIN 64 INTERMEDIATE Intermediate     TRIMETH/SULFA >=320 RESISTANT Resistant     AMPICILLIN/SULBACTAM >=32 RESISTANT Resistant     PIP/TAZO 8 SENSITIVE Sensitive     * 20,000 COLONIES/mL KLEBSIELLA PNEUMONIAE  MRSA Next Gen by PCR, Nasal     Status: None   Collection Time: 12/29/21  8:53 PM   Specimen: Nasal Mucosa;  Nasal Swab  Result Value Ref Range Status   MRSA by PCR Next Gen NOT DETECTED NOT DETECTED Final    Comment: (NOTE) The GeneXpert MRSA Assay (FDA approved for NASAL specimens only), is one component of a comprehensive MRSA colonization surveillance program. It is not intended to diagnose MRSA infection nor to guide or monitor treatment for MRSA infections. Test performance is not FDA approved in patients less than 31 years old. Performed at [facility], [place] [address] [place], [place] [address]   Urine Culture     Status: Abnormal   Collection Time: 01/11/22  5:00 AM   Specimen: Urine, Clean Catch  Result Value Ref Range Status   Specimen Description   Final    URINE, CLEAN CATCH Performed at [facility], [place] [address], [place], [place] [address]    Special Requests   Final    NONE Performed at [facility], [place] [address] [place], [place] [address]    Culture (A)  Final    >=100,000 COLONIES/mL KLEBSIELLA PNEUMONIAE Confirmed Extended Spectrum Beta-Lactamase Producer (ESBL).  In bloodstream infections from ESBL organisms, carbapenems are preferred over piperacillin/tazobactam. They are shown to have a lower risk of mortality.    Report Status 01/13/2022 FINAL  Final   Organism ID, Bacteria KLEBSIELLA PNEUMONIAE (A)  Final      Susceptibility   Klebsiella pneumoniae - MIC*    AMPICILLIN >=32 RESISTANT Resistant     CEFAZOLIN >=64 RESISTANT Resistant     CEFEPIME >=32 RESISTANT Resistant     CEFTRIAXONE >=64 RESISTANT Resistant     CIPROFLOXACIN 1 RESISTANT Resistant     GENTAMICIN >=16 RESISTANT Resistant     IMIPENEM <=0.25 SENSITIVE Sensitive     NITROFURANTOIN 64 INTERMEDIATE Intermediate     TRIMETH/SULFA >=320 RESISTANT Resistant     AMPICILLIN/SULBACTAM >=32 RESISTANT Resistant     PIP/TAZO 8 SENSITIVE Sensitive     * >=100,000 COLONIES/mL KLEBSIELLA PNEUMONIAE    Patient presented with no signs or  symptoms for a UTI and has grown this organism multiple times in the few months (4x/ 3 months), likely chronic colonization. Patient scheduled to finish course of antibiotics in two days. No changes recommended.   ED Provider: Deno Etienne, MD  Francena Hanly, PharmD Pharmacy Resident  01/14/2022 9:40 AM

## 2022-02-02 ENCOUNTER — Other Ambulatory Visit: Payer: Self-pay

## 2022-02-02 ENCOUNTER — Encounter (HOSPITAL_COMMUNITY): Payer: Self-pay

## 2022-02-02 ENCOUNTER — Emergency Department (HOSPITAL_COMMUNITY)
Admission: EM | Admit: 2022-02-02 | Discharge: 2022-02-03 | Disposition: A | Discharge status: Home / Self Care | Payer: Medicare Other | Attending: Emergency Medicine | Admitting: Emergency Medicine

## 2022-02-02 DIAGNOSIS — Z7902 Long term (current) use of antithrombotics/antiplatelets: Secondary | ICD-10-CM | POA: Diagnosis not present

## 2022-02-02 DIAGNOSIS — Z7982 Long term (current) use of aspirin: Secondary | ICD-10-CM | POA: Insufficient documentation

## 2022-02-02 DIAGNOSIS — I129 Hypertensive chronic kidney disease with stage 1 through stage 4 chronic kidney disease, or unspecified chronic kidney disease: Secondary | ICD-10-CM | POA: Insufficient documentation

## 2022-02-02 DIAGNOSIS — E1122 Type 2 diabetes mellitus with diabetic chronic kidney disease: Secondary | ICD-10-CM | POA: Insufficient documentation

## 2022-02-02 DIAGNOSIS — Z89511 Acquired absence of right leg below knee: Secondary | ICD-10-CM | POA: Diagnosis not present

## 2022-02-02 DIAGNOSIS — Z7984 Long term (current) use of oral hypoglycemic drugs: Secondary | ICD-10-CM | POA: Insufficient documentation

## 2022-02-02 DIAGNOSIS — Z89512 Acquired absence of left leg below knee: Secondary | ICD-10-CM | POA: Insufficient documentation

## 2022-02-02 DIAGNOSIS — N189 Chronic kidney disease, unspecified: Secondary | ICD-10-CM | POA: Diagnosis not present

## 2022-02-02 DIAGNOSIS — Z794 Long term (current) use of insulin: Secondary | ICD-10-CM | POA: Diagnosis not present

## 2022-02-02 DIAGNOSIS — E1165 Type 2 diabetes mellitus with hyperglycemia: Secondary | ICD-10-CM | POA: Diagnosis present

## 2022-02-02 DIAGNOSIS — E1140 Type 2 diabetes mellitus with diabetic neuropathy, unspecified: Secondary | ICD-10-CM | POA: Diagnosis not present

## 2022-02-02 DIAGNOSIS — Z79899 Other long term (current) drug therapy: Secondary | ICD-10-CM | POA: Diagnosis not present

## 2022-02-02 DIAGNOSIS — R739 Hyperglycemia, unspecified: Secondary | ICD-10-CM

## 2022-02-02 DIAGNOSIS — G629 Polyneuropathy, unspecified: Secondary | ICD-10-CM

## 2022-02-02 LAB — CBC WITH DIFFERENTIAL/PLATELET
Abs Immature Granulocytes: 0.04 10*3/uL (ref 0.00–0.07)
Basophils Absolute: 0 10*3/uL (ref 0.0–0.1)
Basophils Relative: 1 %
Eosinophils Absolute: 0.1 10*3/uL (ref 0.0–0.5)
Eosinophils Relative: 2 %
HCT: 36.1 % — ABNORMAL LOW (ref 39.0–52.0)
Hemoglobin: 11.3 g/dL — ABNORMAL LOW (ref 13.0–17.0)
Immature Granulocytes: 1 %
Lymphocytes Relative: 20 %
Lymphs Abs: 1 10*3/uL (ref 0.7–4.0)
MCH: 28.4 pg (ref 26.0–34.0)
MCHC: 31.3 g/dL (ref 30.0–36.0)
MCV: 90.7 fL (ref 80.0–100.0)
Monocytes Absolute: 0.4 10*3/uL (ref 0.1–1.0)
Monocytes Relative: 9 %
Neutro Abs: 3.3 10*3/uL (ref 1.7–7.7)
Neutrophils Relative %: 67 %
Platelets: 237 10*3/uL (ref 150–400)
RBC: 3.98 MIL/uL — ABNORMAL LOW (ref 4.22–5.81)
RDW: 12.5 % (ref 11.5–15.5)
WBC: 4.9 10*3/uL (ref 4.0–10.5)
nRBC: 0 % (ref 0.0–0.2)

## 2022-02-02 LAB — COMPREHENSIVE METABOLIC PANEL
ALT: 10 U/L (ref 0–44)
AST: 14 U/L — ABNORMAL LOW (ref 15–41)
Albumin: 3 g/dL — ABNORMAL LOW (ref 3.5–5.0)
Alkaline Phosphatase: 103 U/L (ref 38–126)
Anion gap: 8 (ref 5–15)
BUN: 48 mg/dL — ABNORMAL HIGH (ref 6–20)
CO2: 17 mmol/L — ABNORMAL LOW (ref 22–32)
Calcium: 7.9 mg/dL — ABNORMAL LOW (ref 8.9–10.3)
Chloride: 105 mmol/L (ref 98–111)
Creatinine, Ser: 3.49 mg/dL — ABNORMAL HIGH (ref 0.61–1.24)
GFR, Estimated: 20 mL/min — ABNORMAL LOW (ref >60)
Glucose, Bld: 432 mg/dL — ABNORMAL HIGH (ref 70–99)
Potassium: 3.8 mmol/L (ref 3.5–5.1)
Sodium: 130 mmol/L — ABNORMAL LOW (ref 135–145)
Total Bilirubin: 0.5 mg/dL (ref 0.3–1.2)
Total Protein: 7.2 g/dL (ref 6.5–8.1)

## 2022-02-02 LAB — CBG MONITORING, ED: Glucose-Capillary: 383 mg/dL — ABNORMAL HIGH (ref 70–99)

## 2022-02-02 NOTE — ED Provider Triage Note (Signed)
Emergency Medicine Provider Triage Evaluation Note  CALLAHAN WILD , a 56 y.o. male  was evaluated in triage.  Pt complains of shooting pains from his right knee to where his right foot should be. He is s/p bilateral AKA. His right leg was amputated 4 years ago. He has never had any neuropathy in that leg and no history of phantom leg pain. He is on 15 mg home oxy and gabapentin three times a day.  He is also found to have blood sugar of 383  Review of Systems  Positive:  Negative:   Physical Exam  There were no vitals taken for this visit. Gen:   Awake, no distress   Resp:  Normal effort  MSK:   Moves extremities without difficulty  Other:    Medical Decision Making  Medically screening exam initiated at 10:24 PM.  Appropriate orders placed.  DONACIANO RANGE was informed that the remainder of the evaluation will be completed by another provider, this initial triage assessment does not replace that evaluation, and the importance of remaining in the ED until their evaluation is complete.     Adolphus Birchwood, PA-C 02/02/22 2225

## 2022-02-02 NOTE — ED Triage Notes (Signed)
Pt comes via [facility] from home for nerve pain in his R leg, pt has BKA on L leg, and CBG has been running high today

## 2022-02-03 DIAGNOSIS — E1165 Type 2 diabetes mellitus with hyperglycemia: Secondary | ICD-10-CM | POA: Diagnosis not present

## 2022-02-03 LAB — CBG MONITORING, ED: Glucose-Capillary: 370 mg/dL — ABNORMAL HIGH (ref 70–99)

## 2022-02-03 MED ORDER — OXYCODONE-ACETAMINOPHEN 5-325 MG PO TABS: 1.0000 | ORAL_TABLET | Freq: Three times a day (TID) | ORAL | 0 refills | Status: DC | PRN

## 2022-02-03 MED ORDER — AMLODIPINE BESYLATE 5 MG PO TABS
10.0000 mg | ORAL_TABLET | Freq: Once | ORAL | Status: AC
  Administered 2022-02-03: 10 mg via ORAL
  Filled 2022-02-03: qty 2

## 2022-02-03 MED ORDER — GABAPENTIN 100 MG PO CAPS
200.0000 mg | ORAL_CAPSULE | Freq: Once | ORAL | Status: AC
  Administered 2022-02-03: 200 mg via ORAL
  Filled 2022-02-03: qty 2

## 2022-02-03 MED ORDER — OXYCODONE-ACETAMINOPHEN 5-325 MG PO TABS
1.0000 | ORAL_TABLET | Freq: Once | ORAL | Status: AC
  Administered 2022-02-03: 1 via ORAL
  Filled 2022-02-03: qty 1

## 2022-02-03 MED ORDER — GABAPENTIN 100 MG PO CAPS: 100.0000 mg | ORAL_CAPSULE | Freq: Three times a day (TID) | ORAL | 0 refills | Status: DC

## 2022-02-03 MED ORDER — INSULIN ASPART 100 UNIT/ML IJ SOLN
10.0000 [IU] | Freq: Once | INTRAMUSCULAR | Status: AC
  Administered 2022-02-03: 10 [IU] via SUBCUTANEOUS
  Filled 2022-02-03: qty 0.1

## 2022-02-03 NOTE — ED Provider Notes (Cosign Needed Addendum)
[facility] DEPT Provider Note   CSN: [id number] Arrival date & time: 02/02/22  2214     History  Chief Complaint  Patient presents with   Hyperglycemia    Keith [name] is a 56 y.o. male with medical history of left-sided AKA, right-sided BKA, hypertension, GERD, diabetes type 2, CKD, neuropathy.  Patient presents to ED for evaluation of right lower extremity nerve pain as well as hyperglycemia.  The patient reports that beginning yesterday he developed nerve pain.  The patient reports that despite the pain beginning yesterday, he has been taking his mother's oxycodones at home for the last 4 days for this nerve pain.  The patient denies any use of gabapentin.  Patient also here reporting hyperglycemia.  The patient CBG on chart review appears to be in the 300s.  The patient denies any shortness of breath, excessive urination, excessive thirst, excessive hunger.  Patient reports that sugars are typically always high.  Patient states that he attributes the sugar being high to some pizza that he ate 2 days ago.  Patient reports that he is compliant on his medications at home to include insulin.  Patient denies any nausea, vomiting, fevers, abdominal pain, dysuria.   Hyperglycemia Associated symptoms: no abdominal pain, no fever, no increased thirst, no nausea, no polyuria, no shortness of breath and no vomiting        Home Medications Prior to Admission medications   Medication Sig Start Date End Date Taking? Authorizing Provider  gabapentin (NEURONTIN) 100 MG capsule Take 1 capsule (100 mg total) by mouth 3 (three) times daily. 02/03/22  Yes Azucena Cecil, PA-C  oxyCODONE-acetaminophen (PERCOCET/ROXICET) 5-325 MG tablet Take 1 tablet by mouth every 8 (eight) hours as needed for severe pain. 02/03/22  Yes Azucena Cecil, PA-C  Accu-Chek Softclix Lancets lancets Use as directed to check blood glucose 4 times a day. 01/01/22   Shelly Coss, MD   allopurinol (ZYLOPRIM) 100 MG tablet Take 100 mg by mouth daily.    [provider]  amLODipine (NORVASC) 10 MG tablet Take 10 mg by mouth daily.    [provider]  aspirin EC 81 MG tablet Take 81 mg by mouth daily. Swallow whole.    [provider]  atorvastatin (LIPITOR) 80 MG tablet Take 1 tablet (80 mg total) by mouth daily. 09/01/20 01/02/22  [name], MD  blood glucose meter kit and supplies KIT Use to check blood glucose up to four times daily as directed. 01/01/22   Shelly Coss, MD  carvedilol (COREG) 3.125 MG tablet Take 3.125 mg by mouth 2 (two) times daily with a meal.    [provider]  clopidogrel (PLAVIX) 75 MG tablet Take 1 tablet (75 mg total) by mouth daily. 11/15/21   Jettie Booze, MD  cyclobenzaprine (FLEXERIL) 5 MG tablet Take 5 mg by mouth 3 (three) times daily as needed for muscle spasms. 12/20/21   [provider]  dapsone 100 MG tablet TAKE 1 TABLET (100 MG TOTAL) BY MOUTH DAILY. Patient not taking: Reported on 12/30/2021 02/13/21 02/13/22  Golden Circle, FNP  docusate sodium (COLACE) 100 MG capsule Take 1 capsule (100 mg total) by mouth 2 (two) times daily as needed for mild constipation (take when no BM for 1 day.). Patient not taking: Reported on 12/18/2021 12/15/21 12/15/22  [name], MD  dolutegravir-lamiVUDine (DOVATO) 50-300 MG tablet Take 1 tablet by mouth daily. 02/13/21   Golden Circle, FNP  doravirine (PIFELTRO)  100 MG TABS tablet Take 1 tablet (100 mg total) by mouth daily. 02/13/21   Golden Circle, FNP  escitalopram (LEXAPRO) 20 MG tablet Take 1 tablet (20 mg total) by mouth daily. 10/19/21   Hosie Poisson, MD  ferrous sulfate 325 (65 FE) MG EC tablet Take 325 mg by mouth daily with breakfast.    [provider]  ferrous sulfate 325 (65 FE) MG tablet Take 1 tablet (325 mg total) by mouth every Monday, Wednesday, and Friday at 6 PM. Do not take with other medicines. 10/19/21 12/18/21  Hosie Poisson, MD  finasteride (PROSCAR) 5 MG tablet Take 5 mg by mouth daily. 08/20/21   [provider]  glucose blood (ACCU-CHEK GUIDE) test strip Use as directed to check blood glucose 4 times a day. 01/01/22   Shelly Coss, MD  HUMALOG KWIKPEN 100 UNIT/ML KwikPen Inject 0-15 Units into the skin See admin instructions. Inject 0-15 units into the skin three times a day with meals, per sliding scale: CBG 70 - 120: 0 units; CBG 121 - 150: 2 units; CBG 151 - 200: 3 units; CBG 201 - 250: 5 units; CBG 251 - 300: 8 units; CBG 301 - 350: 11 units; CBG 351 - 400: 15 units    [provider]  hydrALAZINE (APRESOLINE) 10 MG tablet Take 10 mg by mouth 2 (two) times daily.    [provider]  hydrochlorothiazide (HYDRODIURIL) 12.5 MG tablet Take 12.5 mg by mouth daily.    [provider]  insulin glargine (LANTUS) 100 UNIT/ML Solostar Pen Inject 38 Units into the skin daily. 01/01/22   Shelly Coss, MD  insulin lispro (HUMALOG KWIKPEN) 100 UNIT/ML KwikPen Inject 10 Units into the skin 3 (three) times daily. 01/01/22   Shelly Coss, MD  Insulin Pen Needle (UNIFINE PENTIPS) 32G X 6 MM MISC Use up to 4 pen needles per day as directed. 01/01/22   Shelly Coss, MD  Lancets [facility] ULTRASOFT) lancets Use as instructed 02/16/20   [name], FNP  losartan (COZAAR) 50 MG tablet Take 50 mg by mouth daily.    [provider]  metFORMIN (GLUCOPHAGE) 1000 MG tablet Take 1,000 mg by mouth 2 (two) times daily. 12/21/21   [provider]  nitroGLYCERIN (NITROSTAT) 0.4 MG SL tablet Place 0.4 mg under the tongue every 5 (five) minutes as needed for chest pain.    [provider]  oxyCODONE (ROXICODONE) 15 MG immediate release tablet Take 15 mg by mouth 4 (four) times daily. 12/20/21   [provider]  pregabalin (LYRICA) 50 MG capsule Take 50 mg by mouth 2 (two) times daily. 12/20/21   [provider]  sodium bicarbonate 650 MG tablet Take 1  tablet (650 mg total) by mouth 3 (three) times daily. Patient taking differently: Take 650 mg by mouth 2 (two) times daily. 12/15/21   [name], MD      Allergies    Elemental sulfur, Metformin and related, Zetia [ezetimibe], and Sulfa antibiotics    Review of Systems   Review of Systems  Constitutional:  Negative for fever.  Respiratory:  Negative for shortness of breath.   Gastrointestinal:  Negative for abdominal pain, nausea and vomiting.  Endocrine: Negative for polydipsia, polyphagia and polyuria.  All other systems reviewed and are negative.   Physical Exam Updated Vital Signs BP (!) 177/117 (BP Location: Right Arm)   Pulse 74   Temp 97.9 F (36.6 C) (Oral)   Resp 18   SpO2  100%  Physical Exam Vitals and nursing note reviewed.  Constitutional:      General: He is not in acute distress.    Appearance: Normal appearance. He is not ill-appearing, toxic-appearing or diaphoretic.  HENT:     Head: Normocephalic and atraumatic.     Nose: Nose normal. No congestion.  Cardiovascular:     Rate and Rhythm: Normal rate and regular rhythm.  Pulmonary:     Effort: Pulmonary effort is normal.     Breath sounds: Normal breath sounds. No wheezing.  Abdominal:     General: Abdomen is flat.     Palpations: Abdomen is soft.     Tenderness: There is no abdominal tenderness.  Musculoskeletal:     Cervical back: Normal range of motion and neck supple. No tenderness.     Right Lower Extremity: Right leg is amputated below knee.     Left Lower Extremity: Left leg is amputated above knee.  Skin:    General: Skin is warm and dry.     Capillary Refill: Capillary refill takes less than 2 seconds.  Neurological:     General: No focal deficit present.     Mental Status: He is alert and oriented to person, place, and time.     GCS: GCS eye subscore is 4. GCS verbal subscore is 5. GCS motor subscore is 6.     Cranial Nerves: Cranial nerves 2-12 are intact. No cranial nerve deficit.      Sensory: Sensation is intact. No sensory deficit.     Motor: Motor function is intact. No weakness.     Coordination: Coordination is intact. Heel to Shin Test normal.     ED Results / Procedures / Treatments   Labs (all labs ordered are listed, but only abnormal results are displayed) Labs Reviewed  COMPREHENSIVE METABOLIC PANEL - Abnormal; Notable for the following components:      Result Value   Sodium 130 (*)    CO2 17 (*)    Glucose, Bld 432 (*)    BUN 48 (*)    Creatinine, Ser 3.49 (*)    Calcium 7.9 (*)    Albumin 3.0 (*)    AST 14 (*)    GFR, Estimated 20 (*)    All other components within normal limits  CBC WITH DIFFERENTIAL/PLATELET - Abnormal; Notable for the following components:   RBC 3.98 (*)    Hemoglobin 11.3 (*)    HCT 36.1 (*)    All other components within normal limits  CBG MONITORING, ED - Abnormal; Notable for the following components:   Glucose-Capillary 383 (*)    All other components within normal limits  CBG MONITORING, ED - Abnormal; Notable for the following components:   Glucose-Capillary 370 (*)    All other components within normal limits    EKG None  Radiology No results found.  Procedures Procedures   Medications Ordered in ED Medications  amLODipine (NORVASC) tablet 10 mg (has no administration in time range)  gabapentin (NEURONTIN) capsule 200 mg (200 mg Oral Given 02/03/22 0723)  oxyCODONE-acetaminophen (PERCOCET/ROXICET) 5-325 MG per tablet 1 tablet (1 tablet Oral Given 02/03/22 0721)  insulin aspart (novoLOG) injection 10 Units (10 Units Subcutaneous Given 02/03/22 0751)    ED Course/ Medical Decision Making/ A&P                           Medical Decision Making Risk Prescription drug management.   56 year old male presents to  the ED for evaluation of neuropathy as well as high blood sugar.  Please see HPI for further details.  On my examination the patient is afebrile and nontachycardic.  The patient lung sounds are  clear bilaterally, he is not hypoxic.  Patient abdomen is soft and compressible throughout.  The patient neurological examination shows no focal neurodeficits.  Due to patient complaint we will proceed with the following work-up to include CBC, CMP, point-of-care blood glucose.  I have provided the patient with an initial dose of 200 mg of gabapentin, 5 mg oxycodone for pain.  Patient CMP shows decreased sodium to 130 which the patient will replete as an outpatient.  The patient CMP also shows a baseline creatinine of 3.4.  The patient anion gap is not elevated so I doubt DKA.  The patient CBC is unremarkable other than a decrease hemoglobin to 11.3 over the patient denies any dark stools, lightheadedness, dizziness, shortness of breath.  The patient CBG is 370.  We have treated this with 10 units of insulin.  At this time, the patient will be discharged home and advised to follow-up with his PCP for further evaluation of nerve pain.  Patient is requesting that I send him home with enough pain medication to last him until he is seen on November 13 at his PCPs office.  I advised the patient that I would not be able to provide him with this much pain medication however I could provide him with a short course for breakthrough pain.  I will send the patient home with gabapentin prescription as well as short-term pain medication prescription.  The patient has been given return precautions and he is voiced understanding.  The patient has had all his questions answered to his satisfaction.  The patient is stable at this time for discharge home.  Addendum: Prior to discharge, the patient blood pressure was noted to be elevated.  The patient denies any chest pain, shortness of breath, headache, blurred vision.  The patient states that he has not taken his morning dose of amlodipine.  This was provided to him before discharge.  Final Clinical Impression(s) / ED Diagnoses Final diagnoses:  Hyperglycemia   Neuropathy    Rx / DC Orders ED Discharge Orders          Ordered    gabapentin (NEURONTIN) 100 MG capsule  3 times daily        02/03/22 0823    oxyCODONE-acetaminophen (PERCOCET/ROXICET) 5-325 MG tablet  Every 8 hours PRN        02/03/22 0823                  Azucena Cecil, PA-C 02/03/22 3532    Dorie Rank, MD 02/04/22 [phone]

## 2022-02-03 NOTE — Discharge Instructions (Addendum)
Return to the ED with any new or worsening signs or symptoms Please continue taking medication for diabetes at home Do not take your blood pressure medication as we have already given you your dose for today Please follow-up with your PCP for further management of your neuropathy Please begin taking once daily gabapentin.  Please also take pain medication for breakthrough pain preferably only at night.

## 2022-02-07 ENCOUNTER — Ambulatory Visit: Payer: Medicaid Other | Admitting: Orthopedic Surgery

## 2022-02-11 ENCOUNTER — Ambulatory Visit (INDEPENDENT_AMBULATORY_CARE_PROVIDER_SITE_OTHER): Payer: Medicare Other | Admitting: Orthopedic Surgery

## 2022-02-11 DIAGNOSIS — Z89511 Acquired absence of right leg below knee: Secondary | ICD-10-CM | POA: Diagnosis not present

## 2022-02-11 DIAGNOSIS — S78112A Complete traumatic amputation at level between left hip and knee, initial encounter: Secondary | ICD-10-CM

## 2022-02-11 DIAGNOSIS — Z89612 Acquired absence of left leg above knee: Secondary | ICD-10-CM

## 2022-02-12 ENCOUNTER — Emergency Department (HOSPITAL_COMMUNITY)
Admission: EM | Admit: 2022-02-12 | Discharge: 2022-02-12 | Disposition: A | Discharge status: Home / Self Care | Payer: Medicare Other | Attending: Emergency Medicine | Admitting: Emergency Medicine

## 2022-02-12 ENCOUNTER — Encounter (HOSPITAL_COMMUNITY): Payer: Self-pay

## 2022-02-12 ENCOUNTER — Emergency Department (HOSPITAL_COMMUNITY): Payer: Medicare Other

## 2022-02-12 ENCOUNTER — Encounter: Payer: Self-pay | Admitting: Orthopedic Surgery

## 2022-02-12 DIAGNOSIS — Z9861 Coronary angioplasty status: Secondary | ICD-10-CM | POA: Diagnosis not present

## 2022-02-12 DIAGNOSIS — W19XXXA Unspecified fall, initial encounter: Secondary | ICD-10-CM

## 2022-02-12 DIAGNOSIS — M545 Low back pain, unspecified: Secondary | ICD-10-CM | POA: Insufficient documentation

## 2022-02-12 DIAGNOSIS — S0990XA Unspecified injury of head, initial encounter: Secondary | ICD-10-CM | POA: Diagnosis not present

## 2022-02-12 DIAGNOSIS — R739 Hyperglycemia, unspecified: Secondary | ICD-10-CM

## 2022-02-12 DIAGNOSIS — E1165 Type 2 diabetes mellitus with hyperglycemia: Secondary | ICD-10-CM | POA: Insufficient documentation

## 2022-02-12 DIAGNOSIS — E1122 Type 2 diabetes mellitus with diabetic chronic kidney disease: Secondary | ICD-10-CM | POA: Insufficient documentation

## 2022-02-12 DIAGNOSIS — I2510 Atherosclerotic heart disease of native coronary artery without angina pectoris: Secondary | ICD-10-CM | POA: Diagnosis not present

## 2022-02-12 DIAGNOSIS — I129 Hypertensive chronic kidney disease with stage 1 through stage 4 chronic kidney disease, or unspecified chronic kidney disease: Secondary | ICD-10-CM | POA: Diagnosis not present

## 2022-02-12 DIAGNOSIS — Z21 Asymptomatic human immunodeficiency virus [HIV] infection status: Secondary | ICD-10-CM | POA: Insufficient documentation

## 2022-02-12 DIAGNOSIS — N184 Chronic kidney disease, stage 4 (severe): Secondary | ICD-10-CM | POA: Diagnosis not present

## 2022-02-12 LAB — BASIC METABOLIC PANEL
Anion gap: 5 (ref 5–15)
BUN: 45 mg/dL — ABNORMAL HIGH (ref 6–20)
CO2: 19 mmol/L — ABNORMAL LOW (ref 22–32)
Calcium: 7.9 mg/dL — ABNORMAL LOW (ref 8.9–10.3)
Chloride: 101 mmol/L (ref 98–111)
Creatinine, Ser: 3.78 mg/dL — ABNORMAL HIGH (ref 0.61–1.24)
GFR, Estimated: 18 mL/min — ABNORMAL LOW (ref >60)
Glucose, Bld: 588 mg/dL (ref 70–99)
Potassium: 4.3 mmol/L (ref 3.5–5.1)
Sodium: 125 mmol/L — ABNORMAL LOW (ref 135–145)

## 2022-02-12 LAB — CBC
HCT: 35.2 % — ABNORMAL LOW (ref 39.0–52.0)
Hemoglobin: 11.4 g/dL — ABNORMAL LOW (ref 13.0–17.0)
MCH: 28.4 pg (ref 26.0–34.0)
MCHC: 32.4 g/dL (ref 30.0–36.0)
MCV: 87.8 fL (ref 80.0–100.0)
Platelets: 208 10*3/uL (ref 150–400)
RBC: 4.01 MIL/uL — ABNORMAL LOW (ref 4.22–5.81)
RDW: 12.3 % (ref 11.5–15.5)
WBC: 4.2 10*3/uL (ref 4.0–10.5)
nRBC: 0 % (ref 0.0–0.2)

## 2022-02-12 LAB — CBG MONITORING, ED
Glucose-Capillary: >600 mg/dL (ref 70–99)
Glucose-Capillary: 305 mg/dL — ABNORMAL HIGH (ref 70–99)

## 2022-02-12 LAB — BETA-HYDROXYBUTYRIC ACID: Beta-Hydroxybutyric Acid: 0.07 mmol/L (ref 0.05–0.27)

## 2022-02-12 MED ORDER — INSULIN ASPART 100 UNIT/ML IV SOLN
10.0000 [IU] | Freq: Once | INTRAVENOUS | Status: AC
  Administered 2022-02-12: 10 [IU] via INTRAVENOUS
  Filled 2022-02-12: qty 0.1

## 2022-02-12 MED ORDER — SODIUM CHLORIDE 0.9 % IV BOLUS
1000.0000 mL | Freq: Once | INTRAVENOUS | Status: AC
  Administered 2022-02-12: 1000 mL via INTRAVENOUS
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
## 2022-02-12 NOTE — Discharge Instructions (Signed)
You were evaluated in the Emergency Department and after careful evaluation, we did not find any emergent condition requiring admission or further testing in the hospital.  Your exam/testing today was overall reassuring.  CTs and x-rays did not show any significant injuries.  Your blood sugar improved with fluids and insulin.  Recommend follow-up with your regular doctors.  Please return to the Emergency Department if you experience any worsening of your condition.  Thank you for allowing [place] to be a part of your care.
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
## 2022-02-12 NOTE — ED Provider Notes (Signed)
[facility] Emergency Department Provider Note MRN:  [id number]  Arrival date & time: 02/12/22     Chief Complaint   Fall and Hyperglycemia   History of Present Illness   Keith [name] is a 56 y.o. year-old male with a history of CAD, AIDS presenting to the ED with chief complaint of fall.  Patient is a wheelchair tipped over today and he fell backwards.  Has a bump on the back of his head, endorsing soreness to the area as well as neck soreness, lower back soreness.  Also found to have a blood sugar greater than 600.  Review of Systems  A thorough review of systems was obtained and all systems are negative except as noted in the HPI and PMH.   Patient's Health History    Past Medical History:  Diagnosis Date   Abscess of right foot    abscess/ulcer of R transtibial amputation requiring IV abx   Acute ST elevation myocardial infarction (STEMI) due to occlusion of circumflex coronary artery ([place]) 03/22/2020   AIDS (HCC)    Anemia    CAD (coronary artery disease)    a. MI with stenting of OM1 in 11/2018 with residual disease. b. acute STEMI 03/2020 s/p DES to OM1   Chronic anemia    Chronic knee pain    right   Chronic pain    CKD (chronic kidney disease), stage IV (HCC)    CVA (cerebral vascular accident) ([place]) 04/2020   Diabetes type 2, uncontrolled    HgA1c 17.6 (04/27/2010)   Diabetic foot ulcer ([place]) 01/2017   right foot   Dilatation of aorta (HCC)    Erectile dysfunction    Genital warts    GERD (gastroesophageal reflux disease)    History of blood transfusion    HIV (human immunodeficiency virus infection) ([place]) 2009   CD4 count 100, VL 13800 (05/01/2010)   Hyperlipidemia    Hypertension    Myocardial infarction [facility])    Neuropathy    Noncompliance with medication regimen    Osteomyelitis ([place])    h/o hand   Osteomyelitis of metatarsal ([place]) 04/28/2017   Pneumonia    STEMI (ST elevation myocardial infarction) ([place]) 11/10/2018     Past Surgical History:  Procedure Laterality Date   AMPUTATION Right 05/02/2017   Procedure: AMPUTATION TRANSMETARSAL;  Surgeon: [name], MD;  Location: [place];  Service: Orthopedics;  Laterality: Right;   AMPUTATION Right 06/13/2017   Procedure: RIGHT BELOW KNEE AMPUTATION;  Surgeon: [name], MD;  Location: [place];  Service: Orthopedics;  Laterality: Right;   AMPUTATION Left 10/27/2020   Procedure: LEFT BELOW KNEE AMPUTATION;  Surgeon: [name], MD;  Location: [place];  Service: Orthopedics;  Laterality: Left;   AMPUTATION Left 11/17/2020   Procedure: REVISION AMPUTATION BELOW KNEE, LEFT;  Surgeon: [name], MD;  Location: [place];  Service: Orthopedics;  Laterality: Left;   AMPUTATION Left 01/03/2021   Procedure: LEFT ABOVE KNEE AMPUTATION;  Surgeon: [name], MD;  Location: [place];  Service: Orthopedics;  Laterality: Left;   APPLICATION OF WOUND VAC Left 10/27/2020   Procedure: APPLICATION OF WOUND VAC;  Surgeon: [name], MD;  Location: [place];  Service: Orthopedics;  Laterality: Left;   BELOW KNEE LEG AMPUTATION Right 06/13/2017   BELOW KNEE LEG AMPUTATION     CORONARY STENT INTERVENTION N/A 11/10/2018   Procedure: CORONARY STENT INTERVENTION;  Surgeon: Leonie Man, MD;  Location: [place] [facility];  Service: Cardiovascular;  Laterality: N/A;   CORONARY/GRAFT ACUTE MI REVASCULARIZATION N/A 03/21/2020   Procedure: Coronary/Graft Acute MI Revascularization;  Surgeon: [name], MD;  Location: [place] [facility];  Service: Cardiovascular;  Laterality: N/A;   CYSTOSCOPY N/A 11/09/2020   Procedure: CYSTOSCOPY, EVACUATION OF CLOTS, FULGERATION OF BLADDER AND BILATERIAL RETROGRADE PYELOGRAMS;  Surgeon: Janith Lima, MD;  Location: [place];  Service: Urology;  Laterality: N/A;   HERNIA REPAIR     I & D EXTREMITY Left 08/21/2014   Procedure: INCISION AND DRAINAGE LEFT SMALL FINGER;  Surgeon: Leanora Cover, MD;  Location: [place];  Service: Orthopedics;  Laterality:  Left;   I & D EXTREMITY Right 03/18/2017   Procedure: IRRIGATION AND DEBRIDEMENT EXTREMITY;  Surgeon: [name], MD;  Location: [place];  Service: Orthopedics;  Laterality: Right;   IR FLUORO GUIDE CV LINE RIGHT  11/02/2020   IR REMOVAL TUN CV CATH W/O FL  11/13/2020   IR US GUIDE VASC ACCESS RIGHT  11/02/2020   LEFT HEART CATH AND CORONARY ANGIOGRAPHY N/A 11/10/2018   Procedure: LEFT HEART CATH AND CORONARY ANGIOGRAPHY;  Surgeon: Leonie Man, MD;  Location: [place] [facility];  Service: Cardiovascular;  Laterality: N/A;   LEFT HEART CATH AND CORONARY ANGIOGRAPHY N/A 03/21/2020   Procedure: LEFT HEART CATH AND CORONARY ANGIOGRAPHY;  Surgeon: [name], MD;  Location: [place] [facility];  Service: Cardiovascular;  Laterality: N/A;   MINOR IRRIGATION AND DEBRIDEMENT OF WOUND Right 04/22/2014   Procedure: IRRIGATION AND DEBRIDEMENT OF RIGHT NECK ABCESS;  Surgeon: Jerrell Belfast, MD;  Location: [place];  Service: ENT;  Laterality: Right;   MULTIPLE EXTRACTIONS WITH ALVEOLOPLASTY N/A 01/18/2013   Procedure: MULTIPLE EXTRACION 3, 6, 7, 10, 11, 13, 21, 22, 27, 28, 29, 30 WITH ALVEOLOPLASTY;  Surgeon: Gae Bon, DDS;  Location: [place];  Service: Oral Surgery;  Laterality: N/A;   SKIN SPLIT GRAFT Right 03/21/2017   Procedure: IRRIGATION AND DEBRIDEMENT RIGHT FOOT AND APPLY SPLIT THICKNESS SKIN GRAFT AND WOUND VAC;  Surgeon: [name], MD;  Location: [place];  Service: Orthopedics;  Laterality: Right;   STUMP REVISION Left 12/02/2020   Procedure: REVISION LEFT BELOW KNEE AMPUTATION;  Surgeon: [name], MD;  Location: [place];  Service: Orthopedics;  Laterality: Left;   TEE WITHOUT CARDIOVERSION N/A 05/02/2017   Procedure: TRANSESOPHAGEAL ECHOCARDIOGRAM (TEE);  Surgeon: Acie Fredrickson, Wonda Cheng, MD;  [facility];  Service: Cardiovascular;  Laterality: N/A;  coincidental to orthopedic case    Family History  Problem Relation Age of Onset   Hypertension Mother    Arthritis Father     Hypertension Father    Hypertension Brother    Cancer Maternal Grandmother 69       unknown type of cancer   Depression Paternal Grandmother     Social History   Socioeconomic History   Marital status: Single    Spouse name: Not on file   Number of children: 3   Years of education: 12   Highest education level: Not on file  Occupational History   Occupation: disabled  Tobacco Use   Smoking status: Never   Smokeless tobacco: Never  Vaping Use   Vaping Use: Never used  Substance and Sexual Activity   Alcohol use: Not Currently   Drug use: Never    Comment: OTC ASA   Sexual activity: Yes    Partners: Female    Comment: declined condoms  Other Topics Concern  Not on file  Social History Narrative   ** Merged History Encounter ** Worked for the city of [place] for 18 years.Unemployed. Applying for disability.Medicaid patient.   Single    Religion Holiness   01/03/21 left BKA surgery    PMH right BK with prosthesis   Staying with his mother [name]   Social Determinants of Health   Financial Resource Strain: Low Risk  (01/10/2021)   Overall Financial Resource Strain (CARDIA)    Difficulty of Paying Living Expenses: Not very hard  Food Insecurity: No Food Insecurity (12/29/2021)   Hunger Vital Sign    Worried About Running Out of Food in the Last Year: Never true    Ran Out of Food in the Last Year: Never true  Transportation Needs: No Transportation Needs (12/29/2021)   PRAPARE - Hydrologist (Medical): No    Lack of Transportation (Non-Medical): No  Physical Activity: Not on file  Stress: No Stress Concern Present (01/10/2021)   [place]    Feeling of Stress : Only a little  Social Connections: Not on file  Intimate Partner Violence: Not At Risk (12/29/2021)   Humiliation, Afraid, Rape, and Kick questionnaire    Fear of Current or Ex-Partner: No    Emotionally Abused: No     Physically Abused: No    Sexually Abused: No     Physical Exam   Vitals:   02/12/22 0600 02/12/22 0606  BP: (!) 193/108 (!) 192/101  Pulse: 77 79  Resp:  18  Temp:    SpO2: 100% 98%    CONSTITUTIONAL: Well-appearing, NAD NEURO/PSYCH:  Alert and oriented x 3, no focal deficits EYES:  eyes equal and reactive ENT/NECK:  no LAD, no JVD CARDIO: Regular rate, well-perfused, normal S1 and S2 PULM:  CTAB no wheezing or rhonchi GI/GU:  non-distended, non-tender MSK/SPINE:  No gross deformities, no edema SKIN: Small occipital hematoma   *Additional and/or pertinent findings included in MDM below  Diagnostic and Interventional Summary    EKG Interpretation  Date/Time:    Ventricular Rate:    PR Interval:    QRS Duration:   QT Interval:    QTC Calculation:   R Axis:     Text Interpretation:         Labs Reviewed  CBC - Abnormal; Notable for the following components:      Result Value   RBC 4.01 (*)    Hemoglobin 11.4 (*)    HCT 35.2 (*)    All other components within normal limits  BASIC METABOLIC PANEL - Abnormal; Notable for the following components:   Sodium 125 (*)    CO2 19 (*)    Glucose, Bld 588 (*)    BUN 45 (*)    Creatinine, Ser 3.78 (*)    Calcium 7.9 (*)    GFR, Estimated 18 (*)    All other components within normal limits  CBG MONITORING, ED - Abnormal; Notable for the following components:   Glucose-Capillary >600 (*)    All other components within normal limits  BETA-HYDROXYBUTYRIC ACID    CT HEAD WO CONTRAST (5MM)  Final Result    CT CERVICAL SPINE WO CONTRAST  Final Result    DG Lumbar Spine Complete  Final Result      Medications  insulin aspart (novoLOG) injection 10 Units (has no administration in time range)  sodium chloride 0.9 % bolus 1,000 mL (1,000 mLs Intravenous New Bag/Given  02/12/22 0522)     Procedures  /  Critical Care Procedures  ED Course and Medical Decision Making  Initial Impression and Ddx Differential  diagnosis includes intracranial bleeding, cervical spinal fracture, lumbar spinal fracture, DKA, electrolyte disturbance, awaiting labs, imaging.  Past medical/surgical history that increases complexity of ED encounter: Diabetes  Interpretation of Diagnostics I personally reviewed the laboratory assessment and my interpretation is as follows: Hyperglycemia and pseudohyponatremia, no signs of DKA.  Trauma imaging reassuring, no intracranial bleeding, no cervical spinal fracture, no signs of lumbar spinal injury.  Patient Reassessment and Ultimate Disposition/Management     Providing fluids, insulin, will recheck sugar, anticipating discharge thereafter.  Patient management required discussion with the following services or consulting groups:  None  Complexity of Problems Addressed Acute illness or injury that poses threat of life of bodily function  Additional Data Reviewed and Analyzed Further history obtained from: Prior labs/imaging results  Additional Factors Impacting ED Encounter Risk None  Bradly Sangiovanni. Sedonia Small, MD [place] [email or web address]  Final Clinical Impressions(s) / ED Diagnoses     ICD-10-CM   1. Fall, initial encounter  W19.XXXA     2. Hyperglycemia  R73.9       ED Discharge Orders     None        Discharge Instructions Discussed with and Provided to Patient:   Discharge Instructions   None      Maudie Flakes, MD 02/12/22 [phone]

## 2022-02-12 NOTE — Progress Notes (Signed)
Office Visit Note   Patient: Keith [name]           Date of Birth: 12/31/1965           MRN: [id number] Visit Date: 02/11/2022              Requested by: [name], NP [id number] [place],  [place] [address] [name], NP  Chief Complaint  Patient presents with   Right Leg - Follow-up    Right BKA   Left Leg - Follow-up    Left AKA      HPI: Patient is a 56 year old gentleman who is status post right transtibial amputation in March 2019 and a left above-the-knee amputation in September 2022.  Patient has difficulty with ambulation and will need further therapy.  Assessment & Plan: Visit Diagnoses:  1. Hx of right BKA ([place])   2. Unilateral AKA, left (HCC)     Plan: A prescription was placed for physical therapy at neuro rehab.  Follow-Up Instructions: Return in about 3 months (around 05/14/2022).   Ortho Exam  Patient is alert, oriented, no adenopathy, well-dressed, normal affect, normal respiratory effort. Examination patient is wearing the prosthesis on the right he has a good fit.  He does not have his prosthesis on the left.  Patient has weakness in his upper extremities as well as lower extremities.  Imaging: CT CERVICAL SPINE WO CONTRAST  Result Date: 02/12/2022 CLINICAL DATA:  56 year old male tripped over wheelchair. Pain. Hypertensive. EXAM: CT CERVICAL SPINE WITHOUT CONTRAST TECHNIQUE: Multidetector CT imaging of the cervical spine was performed without intravenous contrast. Multiplanar CT image reconstructions were also generated. RADIATION DOSE REDUCTION: This exam was performed according to the departmental dose-optimization program which includes automated exposure control, adjustment of the mA and/or kV according to patient size and/or use of iterative reconstruction technique. COMPARISON:  Head CT today.  Neck CT 04/20/2014. FINDINGS: Alignment: Chronic straightening of cervical lordosis appears stable since 2016. Cervicothoracic junction  alignment is within normal limits. Bilateral posterior element alignment is within normal limits. Skull base and vertebrae: Bone mineralization is within normal limits. Visualized skull base is intact. No atlanto-occipital dissociation. C1 and C2 appear intact and aligned. No acute osseous abnormality identified. Soft tissues and spinal canal: No prevertebral fluid or swelling. No visible canal hematoma. Negative noncontrast visible neck soft tissues. Disc levels: Chronic disc and endplate degeneration at C5-C6 is advanced and appears progressed since 2016. Possible mild spinal stenosis there. Moderate C6-C7 degeneration. Upper chest: Asymmetric sclerosis of the medial right clavicle appears to be degenerative, with associated progressed osteophytosis and small subchondral cysts there since 2016. Negative lung apices, visible noncontrast superior mediastinum. Visible upper thoracic levels appear intact. IMPRESSION: 1. No acute traumatic injury identified in the cervical spine. 2. Advanced chronic C5-C6 > C6-C7 degeneration. Possible mild spinal stenosis. Electronically Signed   By: Genevie Ann M.D.   On: 02/12/2022 06:06   DG Lumbar Spine Complete  Result Date: 02/12/2022 CLINICAL DATA:  Low back pain after a fall. EXAM: LUMBAR SPINE - COMPLETE 4+ VIEW COMPARISON:  None Available. FINDINGS: There is no evidence of lumbar spine fracture. Alignment is normal. Intervertebral disc spaces are maintained. IMPRESSION: Negative. Electronically Signed   By: Misty Stanley M.D.   On: 02/12/2022 06:01   CT HEAD WO CONTRAST (5MM)  Result Date: 02/12/2022 CLINICAL DATA:  56 year old male tripped over wheelchair. Pain. Hypertensive. EXAM: CT HEAD WITHOUT CONTRAST TECHNIQUE: Contiguous axial images were obtained from  the base of the skull through the vertex without intravenous contrast. RADIATION DOSE REDUCTION: This exam was performed according to the departmental dose-optimization program which includes automated exposure  control, adjustment of the mA and/or kV according to patient size and/or use of iterative reconstruction technique. COMPARISON:  Head CT 09/10/2021.  Brain MRI 04/25/2020. FINDINGS: Brain: Stable cerebral volume. No midline shift, ventriculomegaly, mass effect, evidence of mass lesion, intracranial hemorrhage or evidence of cortically based acute infarction. Normal for age supratentorial gray-white matter differentiation. Chronic small vessel disease in the brainstem better demonstrated by MRI. Vascular: Mild calcified atherosclerosis at the skull base. No suspicious intracranial vascular hyperdensity. Skull: Chronic bilateral lamina papyracea fractures. No acute osseous abnormality identified. Sinuses/Orbits: Chronic lamina papyracea fractures and stable mild to moderate right maxillary sinus mucosal thickening. Tympanic cavities and mastoids remain clear. Other: No acute orbit or scalp soft tissue injury identified. IMPRESSION: 1. No acute traumatic injury identified. 2. Stable non contrast CT appearance of the brain, with chronic brainstem small vessel disease better demonstrated by MRI. Electronically Signed   By: Genevie Ann M.D.   On: 02/12/2022 06:01   No images are attached to the encounter.  Labs: Lab Results  Component Value Date   HGBA1C 11.5 (H) 12/08/2021   HGBA1C 6.0 (H) 08/17/2021   HGBA1C 5.9 (H) 11/29/2020   ESRSEDRATE 58 (H) 08/22/2021   ESRSEDRATE 128 (H) 10/18/2020   ESRSEDRATE 85 (H) 10/07/2020   CRP 11.2 (H) 10/18/2020   CRP 6.7 (H) 10/07/2020   CRP <0.8 11/02/2018   LABURIC 6.4 08/22/2021   LABURIC 6.3 08/07/2009   REPTSTATUS 01/13/2022 FINAL 01/11/2022   GRAMSTAIN  04/29/2017    NO WBC SEEN RARE GRAM POSITIVE COCCI IN PAIRS IN SINGLES    CULT (A) 01/11/2022    >=100,000 COLONIES/mL KLEBSIELLA PNEUMONIAE Confirmed Extended Spectrum Beta-Lactamase Producer (ESBL).  In bloodstream infections from ESBL organisms, carbapenems are preferred over piperacillin/tazobactam. They are  shown to have a lower risk of mortality.    LABORGA KLEBSIELLA PNEUMONIAE (A) 01/11/2022     Lab Results  Component Value Date   ALBUMIN 3.0 (L) 02/02/2022   ALBUMIN 2.8 (L) 12/30/2021   ALBUMIN 3.1 (L) 12/29/2021   PREALBUMIN 25.5 06/06/2018   PREALBUMIN 7.4 (L) 03/15/2017    Lab Results  Component Value Date   MG 1.7 12/30/2021   MG 1.9 12/29/2021   MG 1.9 12/08/2021   Lab Results  Component Value Date   VD25OH 8.2 (L) 09/07/2018    Lab Results  Component Value Date   PREALBUMIN 25.5 06/06/2018   PREALBUMIN 7.4 (L) 03/15/2017      Latest Ref Rng & Units 02/12/2022    5:16 AM 02/02/2022   10:48 PM 01/10/2022    9:23 PM  CBC EXTENDED  WBC 4.0 - 10.5 K/uL 4.2  4.9  7.8   RBC 4.22 - 5.81 MIL/uL 4.01  3.98  3.80   Hemoglobin 13.0 - 17.0 g/dL 11.4  11.3  11.0   HCT 39.0 - 52.0 % 35.2  36.1  35.2   Platelets 150 - 400 K/uL 208  237  297   NEUT# 1.7 - 7.7 K/uL  3.3    Lymph# 0.7 - 4.0 K/uL  1.0       There is no height or weight on file to calculate BMI.  Orders:  Orders Placed This Encounter  Procedures   Ambulatory referral to Physical Therapy   No orders of the defined types were placed in this encounter.  Procedures: No procedures performed  Clinical Data: No additional findings.  ROS:  All other systems negative, except as noted in the HPI. Review of Systems  Objective: Vital Signs: There were no vitals taken for this visit.  Specialty Comments:  No specialty comments available.  PMFS History: Patient Active Problem List   Diagnosis Date Noted   Fall at home, initial encounter 12/29/2021   Acute cystitis 12/29/2021   History of anemia due to chronic kidney disease 12/29/2021   Nonketotic hyperglycinemia, type II ([place]) 12/18/2021   Asymptomatic bacteriuria 12/18/2021   Type 2 diabetes mellitus with hyperosmolar nonketotic hyperglycemia ([place]) 12/06/2021   Hyperglycemia 12/06/2021   BPH (benign prostatic hyperplasia)    GERD  (gastroesophageal reflux disease)    Headache 08/22/2021   Anxiety 02/13/2021   Healthcare maintenance 02/13/2021   History of left above knee amputation ([place]) 02/07/2021   Wound dehiscence 01/03/2021   Coagulopathy ([place]) 12/22/2020   CAD (coronary artery disease) 12/22/2020   Obesity 12/22/2020   Ulcer of heel due to diabetes mellitus ([place])    Anemia    Hyperglycemia due to diabetes mellitus ([place]) 08/29/2020   Type 2 diabetes mellitus with hyperlipidemia (HCC)    PAD (peripheral artery disease) ([place])    Uncontrolled type 2 diabetes mellitus with hyperglycemia, with long-term current use of insulin with renal complication 16/01/9603   Elevated glucose 01/14/2019   Hx of BKA, right ([place]) 01/14/2019   Pressure injury of skin 11/11/2018   CKD (chronic kidney disease) stage 4, GFR 15-29 ml/min (HCC) 11/10/2018   Amputation stump infection ([place]) 10/28/2018   Type II diabetes mellitus with renal manifestations ([place]) 08/07/2018   Uncontrolled type 2 diabetes mellitus with hyperosmolar nonketotic hyperglycemia ([place]) 08/07/2018   Non-pressure chronic ulcer of right calf limited to breakdown of skin ([place]) 07/06/2018   Type 2 diabetes mellitus without complication, without long-term current use of insulin ([place]) 06/15/2018   Chronic low back pain without sciatica 06/15/2018   Idiopathic chronic venous hypertension of left lower extremity with ulcer and inflammation (HCC)    Venous stasis ulcer of left calf limited to breakdown of skin without varicose veins ([place]) 04/23/2018   Acquired absence of right leg below knee ([place]) 06/13/2017   Diabetic polyneuropathy associated with type 2 diabetes mellitus ([place]) 01/23/2017   Diarrhea 09/28/2016   Nausea vomiting and diarrhea 09/28/2016   Onychomycosis of multiple toenails with type 2 diabetes mellitus ([place]) 08/29/2015   MRSA carrier 04/20/2014   Penile wart 02/22/2014   HIV disease ([place])    Insulin-requiring or dependent type II diabetes mellitus  ([place]) 02/04/2014   Dental anomaly 11/20/2012   Arthritis of right knee 02/23/2012   Hyperlipidemia 11/10/2011   Hyponatremia 11/10/2011   Chronic pain 08/07/2011   Meralgia paraesthetica 04/23/2011   ERECTILE DYSFUNCTION 08/22/2008   HTN (hypertension) 05/19/2008   Past Medical History:  Diagnosis Date   Abscess of right foot    abscess/ulcer of R transtibial amputation requiring IV abx   Acute ST elevation myocardial infarction (STEMI) due to occlusion of circumflex coronary artery ([place]) 03/22/2020   AIDS ([place])    Anemia    CAD (coronary artery disease)    a. MI with stenting of OM1 in 11/2018 with residual disease. b. acute STEMI 03/2020 s/p DES to OM1   Chronic anemia    Chronic knee pain    right   Chronic pain    CKD (chronic kidney disease), stage IV (HCC)    CVA (cerebral vascular accident) ([place]) 04/2020  Diabetes type 2, uncontrolled    HgA1c 17.6 (04/27/2010)   Diabetic foot ulcer ([place]) 01/2017   right foot   Dilatation of aorta (HCC)    Erectile dysfunction    Genital warts    GERD (gastroesophageal reflux disease)    History of blood transfusion    HIV (human immunodeficiency virus infection) ([place]) 2009   CD4 count 100, VL 13800 (05/01/2010)   Hyperlipidemia    Hypertension    Myocardial infarction ([place])    Neuropathy    Noncompliance with medication regimen    Osteomyelitis ([place])    h/o hand   Osteomyelitis of metatarsal ([place]) 04/28/2017   Pneumonia    STEMI (ST elevation myocardial infarction) ([place]) 11/10/2018    Family History  Problem Relation Age of Onset   Hypertension Mother    Arthritis Father    Hypertension Father    Hypertension Brother    Cancer Maternal Grandmother 54       unknown type of cancer   Depression Paternal Grandmother     Past Surgical History:  Procedure Laterality Date   AMPUTATION Right 05/02/2017   Procedure: AMPUTATION TRANSMETARSAL;  Surgeon: [name], MD;  Location: [facility];  Service: Orthopedics;  Laterality:  Right;   AMPUTATION Right 06/13/2017   Procedure: RIGHT BELOW KNEE AMPUTATION;  Surgeon: [name], MD;  Location: [facility];  Service: Orthopedics;  Laterality: Right;   AMPUTATION Left 10/27/2020   Procedure: LEFT BELOW KNEE AMPUTATION;  Surgeon: [name], MD;  Location: [facility];  Service: Orthopedics;  Laterality: Left;   AMPUTATION Left 11/17/2020   Procedure: REVISION AMPUTATION BELOW KNEE, LEFT;  Surgeon: [name], MD;  Location: [facility];  Service: Orthopedics;  Laterality: Left;   AMPUTATION Left 01/03/2021   Procedure: LEFT ABOVE KNEE AMPUTATION;  Surgeon: [name], MD;  Location: [facility];  Service: Orthopedics;  Laterality: Left;   APPLICATION OF WOUND VAC Left 10/27/2020   Procedure: APPLICATION OF WOUND VAC;  Surgeon: [name], MD;  Location: [facility];  Service: Orthopedics;  Laterality: Left;   BELOW KNEE LEG AMPUTATION Right 06/13/2017   BELOW KNEE LEG AMPUTATION     CORONARY STENT INTERVENTION N/A 11/10/2018   Procedure: CORONARY STENT INTERVENTION;  Surgeon: Leonie Man, MD;  Location: [facility];  Service: Cardiovascular;  Laterality: N/A;   CORONARY/GRAFT ACUTE MI REVASCULARIZATION N/A 03/21/2020   Procedure: Coronary/Graft Acute MI Revascularization;  Surgeon: [name], MD;  Location: [facility];  Service: Cardiovascular;  Laterality: N/A;   CYSTOSCOPY N/A 11/09/2020   Procedure: CYSTOSCOPY, EVACUATION OF CLOTS, FULGERATION OF BLADDER AND BILATERIAL RETROGRADE PYELOGRAMS;  Surgeon: Janith Lima, MD;  Location: [facility];  Service: Urology;  Laterality: N/A;   HERNIA REPAIR     I & D EXTREMITY Left 08/21/2014   Procedure: INCISION AND DRAINAGE LEFT SMALL FINGER;  Surgeon: Leanora Cover, MD;  Location: [facility];  Service: Orthopedics;  Laterality: Left;   I & D EXTREMITY Right 03/18/2017   Procedure: IRRIGATION AND DEBRIDEMENT EXTREMITY;  Surgeon: [name], MD;  Location: [facility];  Service: Orthopedics;  Laterality: Right;   IR FLUORO GUIDE CV  LINE RIGHT  11/02/2020   IR REMOVAL TUN CV CATH W/O FL  11/13/2020   IR US GUIDE VASC ACCESS RIGHT  11/02/2020   LEFT HEART CATH AND CORONARY ANGIOGRAPHY N/A 11/10/2018   Procedure: LEFT HEART CATH AND CORONARY ANGIOGRAPHY;  Surgeon: Leonie Man, MD;  Location:  [facility];  Service: Cardiovascular;  Laterality: N/A;   LEFT HEART CATH AND CORONARY ANGIOGRAPHY N/A 03/21/2020   Procedure: LEFT HEART CATH AND CORONARY ANGIOGRAPHY;  Surgeon: [name], MD;  Location: [facility];  Service: Cardiovascular;  Laterality: N/A;   MINOR IRRIGATION AND DEBRIDEMENT OF WOUND Right 04/22/2014   Procedure: IRRIGATION AND DEBRIDEMENT OF RIGHT NECK ABCESS;  Surgeon: Jerrell Belfast, MD;  Location: [facility];  Service: ENT;  Laterality: Right;   MULTIPLE EXTRACTIONS WITH ALVEOLOPLASTY N/A 01/18/2013   Procedure: MULTIPLE EXTRACION 3, 6, 7, 10, 11, 13, 21, 22, 27, 28, 29, 30 WITH ALVEOLOPLASTY;  Surgeon: Gae Bon, DDS;  Location: [facility];  Service: Oral Surgery;  Laterality: N/A;   SKIN SPLIT GRAFT Right 03/21/2017   Procedure: IRRIGATION AND DEBRIDEMENT RIGHT FOOT AND APPLY SPLIT THICKNESS SKIN GRAFT AND WOUND VAC;  Surgeon: [name], MD;  Location: [facility];  Service: Orthopedics;  Laterality: Right;   STUMP REVISION Left 12/02/2020   Procedure: REVISION LEFT BELOW KNEE AMPUTATION;  Surgeon: [name], MD;  Location: [facility];  Service: Orthopedics;  Laterality: Left;   TEE WITHOUT CARDIOVERSION N/A 05/02/2017   Procedure: TRANSESOPHAGEAL ECHOCARDIOGRAM (TEE);  Surgeon: Acie Fredrickson Wonda Cheng, MD;  Location: [facility];  Service: Cardiovascular;  Laterality: N/A;  coincidental to orthopedic case   Social History   Occupational History   Occupation: disabled  Tobacco Use   Smoking status: Never   Smokeless tobacco: Never  Vaping Use   Vaping Use: Never used  Substance and Sexual Activity   Alcohol use: Not Currently   Drug use: Never    Comment: OTC ASA   Sexual activity: Yes    Partners:  Female    Comment: declined condoms

## 2022-02-13 ENCOUNTER — Telehealth: Payer: Self-pay

## 2022-02-13 NOTE — Telephone Encounter (Signed)
Transition Care Management Unsuccessful Follow-up Telephone Call  Date of discharge and from where:  02/11/22  Attempts:  1st Attempt  Reason for unsuccessful TCM follow-up call:  Left voice message  Elyse Jarvis RMA

## 2022-02-14 NOTE — Therapy (Addendum)
Patient Name: Keith [name] MRN: [id number] DOB:1965/07/17, 56 y.o., male Today's Date: 02/14/2022  Physical therapy Discharge summary:  Patient has not followed up since 09/06/21 in physical therapy. Patient was seen for 2 sessions only from 08/15/21 to 09/06/21. Due to lack of progress, patient had not met any short term and long term goals. Patient will be discharged for this episode of therapy for above dates.   Kerrie Pleasure, PT 02/14/2022, 9:23 AM

## 2022-02-15 ENCOUNTER — Other Ambulatory Visit: Payer: Medicaid Other

## 2022-02-18 ENCOUNTER — Ambulatory Visit: Payer: Self-pay | Admitting: Nurse Practitioner

## 2022-02-19 ENCOUNTER — Encounter: Payer: Medicare Other | Admitting: Rehabilitation

## 2022-03-06 ENCOUNTER — Ambulatory Visit: Payer: Self-pay | Admitting: Nurse Practitioner

## 2022-03-09 ENCOUNTER — Inpatient Hospital Stay (HOSPITAL_COMMUNITY)
Admission: EM | Admit: 2022-03-09 | Discharge: 2022-03-18 | DRG: 674 | Disposition: A | Discharge status: Skilled Nursing Facility | Payer: Medicare Other | Attending: Family Medicine | Admitting: Family Medicine

## 2022-03-09 ENCOUNTER — Inpatient Hospital Stay (HOSPITAL_COMMUNITY): Payer: Medicare Other

## 2022-03-09 ENCOUNTER — Emergency Department (HOSPITAL_COMMUNITY): Payer: Medicare Other

## 2022-03-09 DIAGNOSIS — E871 Hypo-osmolality and hyponatremia: Secondary | ICD-10-CM | POA: Diagnosis present

## 2022-03-09 DIAGNOSIS — E1122 Type 2 diabetes mellitus with diabetic chronic kidney disease: Secondary | ICD-10-CM | POA: Diagnosis present

## 2022-03-09 DIAGNOSIS — K219 Gastro-esophageal reflux disease without esophagitis: Secondary | ICD-10-CM | POA: Diagnosis present

## 2022-03-09 DIAGNOSIS — N184 Chronic kidney disease, stage 4 (severe): Secondary | ICD-10-CM | POA: Diagnosis present

## 2022-03-09 DIAGNOSIS — Z89512 Acquired absence of left leg below knee: Secondary | ICD-10-CM | POA: Diagnosis not present

## 2022-03-09 DIAGNOSIS — I252 Old myocardial infarction: Secondary | ICD-10-CM

## 2022-03-09 DIAGNOSIS — E860 Dehydration: Secondary | ICD-10-CM | POA: Diagnosis present

## 2022-03-09 DIAGNOSIS — Z992 Dependence on renal dialysis: Secondary | ICD-10-CM | POA: Diagnosis not present

## 2022-03-09 DIAGNOSIS — I120 Hypertensive chronic kidney disease with stage 5 chronic kidney disease or end stage renal disease: Secondary | ICD-10-CM | POA: Diagnosis present

## 2022-03-09 DIAGNOSIS — E876 Hypokalemia: Secondary | ICD-10-CM | POA: Diagnosis present

## 2022-03-09 DIAGNOSIS — R197 Diarrhea, unspecified: Secondary | ICD-10-CM | POA: Diagnosis present

## 2022-03-09 DIAGNOSIS — E875 Hyperkalemia: Secondary | ICD-10-CM | POA: Diagnosis not present

## 2022-03-09 DIAGNOSIS — Z515 Encounter for palliative care: Secondary | ICD-10-CM | POA: Diagnosis not present

## 2022-03-09 DIAGNOSIS — E669 Obesity, unspecified: Secondary | ICD-10-CM | POA: Diagnosis present

## 2022-03-09 DIAGNOSIS — I454 Nonspecific intraventricular block: Secondary | ICD-10-CM | POA: Diagnosis present

## 2022-03-09 DIAGNOSIS — Z7189 Other specified counseling: Secondary | ICD-10-CM | POA: Diagnosis not present

## 2022-03-09 DIAGNOSIS — I2510 Atherosclerotic heart disease of native coronary artery without angina pectoris: Secondary | ICD-10-CM | POA: Diagnosis not present

## 2022-03-09 DIAGNOSIS — I959 Hypotension, unspecified: Secondary | ICD-10-CM | POA: Diagnosis not present

## 2022-03-09 DIAGNOSIS — N400 Enlarged prostate without lower urinary tract symptoms: Secondary | ICD-10-CM | POA: Diagnosis not present

## 2022-03-09 DIAGNOSIS — E872 Acidosis, unspecified: Secondary | ICD-10-CM | POA: Diagnosis present

## 2022-03-09 DIAGNOSIS — Z5901 Sheltered homelessness: Secondary | ICD-10-CM | POA: Diagnosis not present

## 2022-03-09 DIAGNOSIS — I10 Essential (primary) hypertension: Secondary | ICD-10-CM | POA: Diagnosis not present

## 2022-03-09 DIAGNOSIS — D631 Anemia in chronic kidney disease: Secondary | ICD-10-CM | POA: Diagnosis present

## 2022-03-09 DIAGNOSIS — N186 End stage renal disease: Secondary | ICD-10-CM | POA: Diagnosis present

## 2022-03-09 DIAGNOSIS — F32A Depression, unspecified: Secondary | ICD-10-CM | POA: Diagnosis present

## 2022-03-09 DIAGNOSIS — Z7984 Long term (current) use of oral hypoglycemic drugs: Secondary | ICD-10-CM

## 2022-03-09 DIAGNOSIS — A020 Salmonella enteritis: Secondary | ICD-10-CM | POA: Diagnosis present

## 2022-03-09 DIAGNOSIS — Z8673 Personal history of transient ischemic attack (TIA), and cerebral infarction without residual deficits: Secondary | ICD-10-CM

## 2022-03-09 DIAGNOSIS — E785 Hyperlipidemia, unspecified: Secondary | ICD-10-CM | POA: Diagnosis present

## 2022-03-09 DIAGNOSIS — Z79899 Other long term (current) drug therapy: Secondary | ICD-10-CM

## 2022-03-09 DIAGNOSIS — Z79891 Long term (current) use of opiate analgesic: Secondary | ICD-10-CM

## 2022-03-09 DIAGNOSIS — N189 Chronic kidney disease, unspecified: Secondary | ICD-10-CM

## 2022-03-09 DIAGNOSIS — B20 Human immunodeficiency virus [HIV] disease: Secondary | ICD-10-CM | POA: Diagnosis present

## 2022-03-09 DIAGNOSIS — N179 Acute kidney failure, unspecified: Principal | ICD-10-CM | POA: Diagnosis present

## 2022-03-09 DIAGNOSIS — R338 Other retention of urine: Secondary | ICD-10-CM | POA: Diagnosis present

## 2022-03-09 DIAGNOSIS — F419 Anxiety disorder, unspecified: Secondary | ICD-10-CM | POA: Diagnosis present

## 2022-03-09 DIAGNOSIS — B9620 Unspecified Escherichia coli [E. coli] as the cause of diseases classified elsewhere: Secondary | ICD-10-CM | POA: Diagnosis present

## 2022-03-09 DIAGNOSIS — E1140 Type 2 diabetes mellitus with diabetic neuropathy, unspecified: Secondary | ICD-10-CM | POA: Diagnosis present

## 2022-03-09 DIAGNOSIS — Z6837 Body mass index (BMI) 37.0-37.9, adult: Secondary | ICD-10-CM

## 2022-03-09 DIAGNOSIS — Z818 Family history of other mental and behavioral disorders: Secondary | ICD-10-CM

## 2022-03-09 DIAGNOSIS — Z89511 Acquired absence of right leg below knee: Secondary | ICD-10-CM

## 2022-03-09 DIAGNOSIS — Z955 Presence of coronary angioplasty implant and graft: Secondary | ICD-10-CM

## 2022-03-09 DIAGNOSIS — Z7902 Long term (current) use of antithrombotics/antiplatelets: Secondary | ICD-10-CM

## 2022-03-09 DIAGNOSIS — Z794 Long term (current) use of insulin: Secondary | ICD-10-CM | POA: Diagnosis not present

## 2022-03-09 DIAGNOSIS — A09 Infectious gastroenteritis and colitis, unspecified: Secondary | ICD-10-CM | POA: Diagnosis not present

## 2022-03-09 DIAGNOSIS — I25118 Atherosclerotic heart disease of native coronary artery with other forms of angina pectoris: Secondary | ICD-10-CM | POA: Diagnosis not present

## 2022-03-09 DIAGNOSIS — Z8249 Family history of ischemic heart disease and other diseases of the circulatory system: Secondary | ICD-10-CM

## 2022-03-09 DIAGNOSIS — G8929 Other chronic pain: Secondary | ICD-10-CM | POA: Diagnosis present

## 2022-03-09 DIAGNOSIS — Z7982 Long term (current) use of aspirin: Secondary | ICD-10-CM

## 2022-03-09 DIAGNOSIS — E1151 Type 2 diabetes mellitus with diabetic peripheral angiopathy without gangrene: Secondary | ICD-10-CM | POA: Diagnosis present

## 2022-03-09 DIAGNOSIS — N185 Chronic kidney disease, stage 5: Secondary | ICD-10-CM | POA: Diagnosis not present

## 2022-03-09 DIAGNOSIS — Q441 Other congenital malformations of gallbladder: Secondary | ICD-10-CM

## 2022-03-09 DIAGNOSIS — R5381 Other malaise: Secondary | ICD-10-CM | POA: Diagnosis not present

## 2022-03-09 LAB — CBC
HCT: 35.4 % — ABNORMAL LOW (ref 39.0–52.0)
Hemoglobin: 11.2 g/dL — ABNORMAL LOW (ref 13.0–17.0)
MCH: 27.3 pg (ref 26.0–34.0)
MCHC: 31.6 g/dL (ref 30.0–36.0)
MCV: 86.3 fL (ref 80.0–100.0)
Platelets: 290 K/uL (ref 150–400)
RBC: 4.1 MIL/uL — ABNORMAL LOW (ref 4.22–5.81)
RDW: 12.8 % (ref 11.5–15.5)
WBC: 10 K/uL (ref 4.0–10.5)
nRBC: 0 % (ref 0.0–0.2)

## 2022-03-09 LAB — URINALYSIS, ROUTINE W REFLEX MICROSCOPIC
Bilirubin Urine: NEGATIVE
Glucose, UA: NEGATIVE mg/dL
Ketones, ur: NEGATIVE mg/dL
Nitrite: NEGATIVE
Protein, ur: >300 mg/dL — AB
Specific Gravity, Urine: 1.025 (ref 1.005–1.030)
pH: 6 (ref 5.0–8.0)

## 2022-03-09 LAB — CBC WITH DIFFERENTIAL/PLATELET
Abs Immature Granulocytes: 0.03 10*3/uL (ref 0.00–0.07)
Basophils Absolute: 0 10*3/uL (ref 0.0–0.1)
Basophils Relative: 0 %
Eosinophils Absolute: 0 10*3/uL (ref 0.0–0.5)
Eosinophils Relative: 0 %
HCT: 32.6 % — ABNORMAL LOW (ref 39.0–52.0)
Hemoglobin: 10.4 g/dL — ABNORMAL LOW (ref 13.0–17.0)
Immature Granulocytes: 0 %
Lymphocytes Relative: 21 %
Lymphs Abs: 1.8 10*3/uL (ref 0.7–4.0)
MCH: 26.9 pg (ref 26.0–34.0)
MCHC: 31.9 g/dL (ref 30.0–36.0)
MCV: 84.5 fL (ref 80.0–100.0)
Monocytes Absolute: 0.7 10*3/uL (ref 0.1–1.0)
Monocytes Relative: 8 %
Neutro Abs: 6.1 10*3/uL (ref 1.7–7.7)
Neutrophils Relative %: 71 %
Platelets: 271 10*3/uL (ref 150–400)
RBC: 3.86 MIL/uL — ABNORMAL LOW (ref 4.22–5.81)
RDW: 12.8 % (ref 11.5–15.5)
WBC: 8.6 10*3/uL (ref 4.0–10.5)
nRBC: 0 % (ref 0.0–0.2)

## 2022-03-09 LAB — COMPREHENSIVE METABOLIC PANEL
ALT: 23 U/L (ref 0–44)
AST: 33 U/L (ref 15–41)
Albumin: 2.7 g/dL — ABNORMAL LOW (ref 3.5–5.0)
Alkaline Phosphatase: 65 U/L (ref 38–126)
Anion gap: 20 — ABNORMAL HIGH (ref 5–15)
BUN: 94 mg/dL — ABNORMAL HIGH (ref 6–20)
CO2: 13 mmol/L — ABNORMAL LOW (ref 22–32)
Calcium: 6.9 mg/dL — ABNORMAL LOW (ref 8.9–10.3)
Chloride: 96 mmol/L — ABNORMAL LOW (ref 98–111)
Creatinine, Ser: 12.59 mg/dL — ABNORMAL HIGH (ref 0.61–1.24)
GFR, Estimated: 4 mL/min — ABNORMAL LOW (ref >60)
Glucose, Bld: 146 mg/dL — ABNORMAL HIGH (ref 70–99)
Potassium: 3.2 mmol/L — ABNORMAL LOW (ref 3.5–5.1)
Sodium: 129 mmol/L — ABNORMAL LOW (ref 135–145)
Total Bilirubin: 0.6 mg/dL (ref 0.3–1.2)
Total Protein: 8.7 g/dL — ABNORMAL HIGH (ref 6.5–8.1)

## 2022-03-09 LAB — RENAL FUNCTION PANEL
Albumin: 2.3 g/dL — ABNORMAL LOW (ref 3.5–5.0)
Anion gap: 18 — ABNORMAL HIGH (ref 5–15)
BUN: 91 mg/dL — ABNORMAL HIGH (ref 6–20)
CO2: 14 mmol/L — ABNORMAL LOW (ref 22–32)
Calcium: 6.2 mg/dL — CL (ref 8.9–10.3)
Chloride: 99 mmol/L (ref 98–111)
Creatinine, Ser: 12.5 mg/dL — ABNORMAL HIGH (ref 0.61–1.24)
GFR, Estimated: 4 mL/min — ABNORMAL LOW (ref >60)
Glucose, Bld: 165 mg/dL — ABNORMAL HIGH (ref 70–99)
Phosphorus: 8.4 mg/dL — ABNORMAL HIGH (ref 2.5–4.6)
Potassium: 2.7 mmol/L — CL (ref 3.5–5.1)
Sodium: 131 mmol/L — ABNORMAL LOW (ref 135–145)

## 2022-03-09 LAB — MAGNESIUM: Magnesium: 2.2 mg/dL (ref 1.7–2.4)

## 2022-03-09 LAB — URINALYSIS, MICROSCOPIC (REFLEX): RBC / HPF: NONE SEEN RBC/hpf (ref 0–5)

## 2022-03-09 LAB — LIPASE, BLOOD: Lipase: 44 U/L (ref 11–51)

## 2022-03-09 LAB — CBG MONITORING, ED
Glucose-Capillary: 115 mg/dL — ABNORMAL HIGH (ref 70–99)
Glucose-Capillary: 150 mg/dL — ABNORMAL HIGH (ref 70–99)

## 2022-03-09 LAB — GLUCOSE, CAPILLARY: Glucose-Capillary: 177 mg/dL — ABNORMAL HIGH (ref 70–99)

## 2022-03-09 MED ORDER — LACTATED RINGERS IV BOLUS
1000.0000 mL | Freq: Once | INTRAVENOUS | Status: AC
  Administered 2022-03-09: 1000 mL via INTRAVENOUS

## 2022-03-09 MED ORDER — SODIUM CHLORIDE 0.45 % IV SOLN
INTRAVENOUS | Status: DC
  Filled 2022-03-09: qty 75

## 2022-03-09 MED ORDER — INSULIN ASPART 100 UNIT/ML IJ SOLN
0.0000 [IU] | Freq: Three times a day (TID) | INTRAMUSCULAR | Status: DC
  Administered 2022-03-09 – 2022-03-12 (×3): 1 [IU] via SUBCUTANEOUS
  Administered 2022-03-13: 2 [IU] via SUBCUTANEOUS
  Administered 2022-03-13: 1 [IU] via SUBCUTANEOUS
  Administered 2022-03-13 – 2022-03-15 (×3): 2 [IU] via SUBCUTANEOUS
  Administered 2022-03-15: 3 [IU] via SUBCUTANEOUS
  Administered 2022-03-15 – 2022-03-17 (×5): 2 [IU] via SUBCUTANEOUS
  Administered 2022-03-17: 1 [IU] via SUBCUTANEOUS
  Administered 2022-03-18: 3 [IU] via SUBCUTANEOUS
  Administered 2022-03-18: 2 [IU] via SUBCUTANEOUS
  Administered 2022-03-18: 3 [IU] via SUBCUTANEOUS
  Filled 2022-03-09: qty 0.09

## 2022-03-09 MED ORDER — TAMSULOSIN HCL 0.4 MG PO CAPS
0.4000 mg | ORAL_CAPSULE | Freq: Every day | ORAL | Status: DC
  Administered 2022-03-09 – 2022-03-18 (×10): 0.4 mg via ORAL
  Filled 2022-03-09 (×10): qty 1

## 2022-03-09 MED ORDER — CLOPIDOGREL BISULFATE 75 MG PO TABS
75.0000 mg | ORAL_TABLET | Freq: Every day | ORAL | Status: DC
  Administered 2022-03-09 – 2022-03-18 (×10): 75 mg via ORAL
  Filled 2022-03-09 (×10): qty 1

## 2022-03-09 MED ORDER — ONDANSETRON HCL 4 MG/2ML IJ SOLN: 4.0000 mg | Freq: Four times a day (QID) | INTRAMUSCULAR | Status: DC | PRN

## 2022-03-09 MED ORDER — LOPERAMIDE HCL 2 MG PO CAPS
2.0000 mg | ORAL_CAPSULE | Freq: Once | ORAL | Status: AC
  Administered 2022-03-09: 2 mg via ORAL
  Filled 2022-03-09: qty 1

## 2022-03-09 MED ORDER — FINASTERIDE 5 MG PO TABS
5.0000 mg | ORAL_TABLET | Freq: Every day | ORAL | Status: DC
  Administered 2022-03-10 – 2022-03-18 (×9): 5 mg via ORAL
  Filled 2022-03-09 (×9): qty 1

## 2022-03-09 MED ORDER — PREGABALIN 25 MG PO CAPS
50.0000 mg | ORAL_CAPSULE | Freq: Two times a day (BID) | ORAL | Status: DC
  Administered 2022-03-09 – 2022-03-15 (×12): 50 mg via ORAL
  Filled 2022-03-09 (×12): qty 2

## 2022-03-09 MED ORDER — ACETAMINOPHEN 650 MG RE SUPP: 650.0000 mg | Freq: Four times a day (QID) | RECTAL | Status: DC | PRN

## 2022-03-09 MED ORDER — SODIUM CHLORIDE 0.9 % IV SOLN
1.0000 g | INTRAVENOUS | Status: DC
  Administered 2022-03-09 – 2022-03-10 (×2): 1 g via INTRAVENOUS
  Filled 2022-03-09 (×2): qty 10

## 2022-03-09 MED ORDER — ACETAMINOPHEN 325 MG PO TABS
650.0000 mg | ORAL_TABLET | Freq: Four times a day (QID) | ORAL | Status: DC | PRN
  Filled 2022-03-09: qty 2

## 2022-03-09 MED ORDER — ASPIRIN 81 MG PO TBEC
81.0000 mg | DELAYED_RELEASE_TABLET | Freq: Every day | ORAL | Status: DC
  Administered 2022-03-09 – 2022-03-18 (×10): 81 mg via ORAL
  Filled 2022-03-09 (×11): qty 1

## 2022-03-09 MED ORDER — ESCITALOPRAM OXALATE 10 MG PO TABS
20.0000 mg | ORAL_TABLET | Freq: Every day | ORAL | Status: DC
  Administered 2022-03-10 – 2022-03-18 (×9): 20 mg via ORAL
  Filled 2022-03-09 (×9): qty 2

## 2022-03-09 MED ORDER — POTASSIUM CHLORIDE 10 MEQ/100ML IV SOLN
10.0000 meq | INTRAVENOUS | Status: AC
  Administered 2022-03-09 (×2): 10 meq via INTRAVENOUS
  Filled 2022-03-09 (×2): qty 100

## 2022-03-09 MED ORDER — METRONIDAZOLE 500 MG/100ML IV SOLN
500.0000 mg | Freq: Two times a day (BID) | INTRAVENOUS | Status: DC
  Administered 2022-03-09 – 2022-03-10 (×2): 500 mg via INTRAVENOUS
  Filled 2022-03-09 (×4): qty 100

## 2022-03-09 MED ORDER — ONDANSETRON HCL 4 MG PO TABS
4.0000 mg | ORAL_TABLET | Freq: Four times a day (QID) | ORAL | Status: DC | PRN
  Administered 2022-03-17: 4 mg via ORAL
  Filled 2022-03-09: qty 1

## 2022-03-09 NOTE — ED Provider Notes (Signed)
[facility] Emergency Department Provider Note MRN:  [id number]  Arrival date & time: 03/09/22     Chief Complaint   Weakness   History of Present Illness   Keith [name] is a 56 y.o. year-old male with a history of AIDS, CAD, CKD, diabetes presenting to the ED with chief complaint of weakness.  Patient endorsing weakness and general malaise as well as diarrhea for the past several days.  Denies pain, no fever.  Review of Systems  A thorough review of systems was obtained and all systems are negative except as noted in the HPI and PMH.   Patient's Health History    Past Medical History:  Diagnosis Date   Abscess of right foot    abscess/ulcer of R transtibial amputation requiring IV abx   Acute ST elevation myocardial infarction (STEMI) due to occlusion of circumflex coronary artery ([place]) 03/22/2020   AIDS (HCC)    Anemia    CAD (coronary artery disease)    a. MI with stenting of OM1 in 11/2018 with residual disease. b. acute STEMI 03/2020 s/p DES to OM1   Chronic anemia    Chronic knee pain    right   Chronic pain    CKD (chronic kidney disease), stage IV (HCC)    CVA (cerebral vascular accident) ([place]) 04/2020   Diabetes type 2, uncontrolled    HgA1c 17.6 (04/27/2010)   Diabetic foot ulcer ([place]) 01/2017   right foot   Dilatation of aorta (HCC)    Erectile dysfunction    Genital warts    GERD (gastroesophageal reflux disease)    History of blood transfusion    HIV (human immunodeficiency virus infection) ([place]) 2009   CD4 count 100, VL 13800 (05/01/2010)   Hyperlipidemia    Hypertension    Myocardial infarction [facility])    Neuropathy    Noncompliance with medication regimen    Osteomyelitis ([place])    h/o hand   Osteomyelitis of metatarsal ([place]) 04/28/2017   Pneumonia    STEMI (ST elevation myocardial infarction) ([place]) 11/10/2018    Past Surgical History:  Procedure Laterality Date   AMPUTATION Right 05/02/2017   Procedure: AMPUTATION  TRANSMETARSAL;  Surgeon: [name], MD;  Location: [place];  Service: Orthopedics;  Laterality: Right;   AMPUTATION Right 06/13/2017   Procedure: RIGHT BELOW KNEE AMPUTATION;  Surgeon: [name], MD;  Location: [place];  Service: Orthopedics;  Laterality: Right;   AMPUTATION Left 10/27/2020   Procedure: LEFT BELOW KNEE AMPUTATION;  Surgeon: [name], MD;  Location: [place];  Service: Orthopedics;  Laterality: Left;   AMPUTATION Left 11/17/2020   Procedure: REVISION AMPUTATION BELOW KNEE, LEFT;  Surgeon: [name], MD;  Location: [place];  Service: Orthopedics;  Laterality: Left;   AMPUTATION Left 01/03/2021   Procedure: LEFT ABOVE KNEE AMPUTATION;  Surgeon: [name], MD;  Location: [place];  Service: Orthopedics;  Laterality: Left;   APPLICATION OF WOUND VAC Left 10/27/2020   Procedure: APPLICATION OF WOUND VAC;  Surgeon: [name], MD;  Location: [place];  Service: Orthopedics;  Laterality: Left;   BELOW KNEE LEG AMPUTATION Right 06/13/2017   BELOW KNEE LEG AMPUTATION     CORONARY STENT INTERVENTION N/A 11/10/2018   Procedure: CORONARY STENT INTERVENTION;  Surgeon: Leonie Man, MD;  Location: [place] [facility];  Service: Cardiovascular;  Laterality: N/A;   CORONARY/GRAFT ACUTE MI REVASCULARIZATION N/A 03/21/2020   Procedure: Coronary/Graft Acute MI Revascularization;  Surgeon: Quay Burow  J, MD;  Location: [place] [facility];  Service: Cardiovascular;  Laterality: N/A;   CYSTOSCOPY N/A 11/09/2020   Procedure: CYSTOSCOPY, EVACUATION OF CLOTS, FULGERATION OF BLADDER AND BILATERIAL RETROGRADE PYELOGRAMS;  Surgeon: Janith Lima, MD;  Location: [place];  Service: Urology;  Laterality: N/A;   HERNIA REPAIR     I & D EXTREMITY Left 08/21/2014   Procedure: INCISION AND DRAINAGE LEFT SMALL FINGER;  Surgeon: Leanora Cover, MD;  Location: [place];  Service: Orthopedics;  Laterality: Left;   I & D EXTREMITY Right 03/18/2017   Procedure: IRRIGATION AND DEBRIDEMENT EXTREMITY;  Surgeon: [name], MD;  Location: [place];  Service: Orthopedics;  Laterality: Right;   IR FLUORO GUIDE CV LINE RIGHT  11/02/2020   IR REMOVAL TUN CV CATH W/O FL  11/13/2020   IR US GUIDE VASC ACCESS RIGHT  11/02/2020   LEFT HEART CATH AND CORONARY ANGIOGRAPHY N/A 11/10/2018   Procedure: LEFT HEART CATH AND CORONARY ANGIOGRAPHY;  Surgeon: Leonie Man, MD;  Location: [place] [facility];  Service: Cardiovascular;  Laterality: N/A;   LEFT HEART CATH AND CORONARY ANGIOGRAPHY N/A 03/21/2020   Procedure: LEFT HEART CATH AND CORONARY ANGIOGRAPHY;  Surgeon: [name], MD;  Location: [place] [facility];  Service: Cardiovascular;  Laterality: N/A;   MINOR IRRIGATION AND DEBRIDEMENT OF WOUND Right 04/22/2014   Procedure: IRRIGATION AND DEBRIDEMENT OF RIGHT NECK ABCESS;  Surgeon: Jerrell Belfast, MD;  Location: [place];  Service: ENT;  Laterality: Right;   MULTIPLE EXTRACTIONS WITH ALVEOLOPLASTY N/A 01/18/2013   Procedure: MULTIPLE EXTRACION 3, 6, 7, 10, 11, 13, 21, 22, 27, 28, 29, 30 WITH ALVEOLOPLASTY;  Surgeon: Gae Bon, DDS;  Location: [place];  Service: Oral Surgery;  Laterality: N/A;   SKIN SPLIT GRAFT Right 03/21/2017   Procedure: IRRIGATION AND DEBRIDEMENT RIGHT FOOT AND APPLY SPLIT THICKNESS SKIN GRAFT AND WOUND VAC;  Surgeon: [name], MD;  Location: [place];  Service: Orthopedics;  Laterality: Right;   STUMP REVISION Left 12/02/2020   Procedure: REVISION LEFT BELOW KNEE AMPUTATION;  Surgeon: [name], MD;  Location: [place];  Service: Orthopedics;  Laterality: Left;   TEE WITHOUT CARDIOVERSION N/A 05/02/2017   Procedure: TRANSESOPHAGEAL ECHOCARDIOGRAM (TEE);  Surgeon: Acie Fredrickson, Wonda Cheng, MD;  [facility];  Service: Cardiovascular;  Laterality: N/A;  coincidental to orthopedic case    Family History  Problem Relation Age of Onset   Hypertension Mother    Arthritis Father    Hypertension Father    Hypertension Brother    Cancer Maternal Grandmother 88       unknown type of cancer    Depression Paternal Grandmother     Social History   Socioeconomic History   Marital status: Single    Spouse name: Not on file   Number of children: 3   Years of education: 12   Highest education level: Not on file  Occupational History   Occupation: disabled  Tobacco Use   Smoking status: Never   Smokeless tobacco: Never  Vaping Use   Vaping Use: Never used  Substance and Sexual Activity   Alcohol use: Not Currently   Drug use: Never    Comment: OTC ASA   Sexual activity: Yes    Partners: Female    Comment: declined condoms  Other Topics Concern   Not on file  Social History Narrative   ** Merged History Encounter ** Worked for the city of [place] for 18 years.Unemployed. Applying  for disability.Medicaid patient.   Single    Religion Holiness   01/03/21 left BKA surgery    PMH right BK with prosthesis   Staying with his mother Keith [name]   Social Determinants of Health   Financial Resource Strain: Low Risk  (01/10/2021)   Overall Financial Resource Strain (CARDIA)    Difficulty of Paying Living Expenses: Not very hard  Food Insecurity: No Food Insecurity (12/29/2021)   Hunger Vital Sign    Worried About Running Out of Food in the Last Year: Never true    Ran Out of Food in the Last Year: Never true  Transportation Needs: No Transportation Needs (12/29/2021)   PRAPARE - Hydrologist (Medical): No    Lack of Transportation (Non-Medical): No  Physical Activity: Not on file  Stress: No Stress Concern Present (01/10/2021)   Keith [name]    Feeling of Stress : Only a little  Social Connections: Not on file  Intimate Partner Violence: Not At Risk (12/29/2021)   Humiliation, Afraid, Rape, and Kick questionnaire    Fear of Current or Ex-Partner: No    Emotionally Abused: No    Physically Abused: No    Sexually Abused: No     Physical Exam   Vitals:   03/09/22 0457 03/09/22 0616   BP:  106/68  Pulse:  90  Resp:  16  Temp: 98.4 F (36.9 C) 98.9 F (37.2 C)  SpO2:  100%    CONSTITUTIONAL: Chronically ill-appearing, NAD NEURO/PSYCH:  Alert and oriented x 3, no focal deficits EYES:  eyes equal and reactive ENT/NECK:  no LAD, no JVD CARDIO: Regular rate, well-perfused, normal S1 and S2 PULM:  CTAB no wheezing or rhonchi GI/GU:  non-distended, non-tender MSK/SPINE:  No gross deformities, no edema SKIN:  no rash, atraumatic   *Additional and/or pertinent findings included in MDM below  Diagnostic and Interventional Summary    EKG Interpretation  Date/Time:  March 09, 2022 at 01:41:18 Ventricular Rate:   82 PR Interval:   165 QRS Duration:  129 QT Interval:  450  QTC Calculation:  526 R Axis:     Text Interpretation: Right bundle branch block, sinus rhythm, prolonged QT       Labs Reviewed  CBC - Abnormal; Notable for the following components:      Result Value   RBC 4.10 (*)    Hemoglobin 11.2 (*)    HCT 35.4 (*)    All other components within normal limits  COMPREHENSIVE METABOLIC PANEL - Abnormal; Notable for the following components:   Sodium 129 (*)    Potassium 3.2 (*)    Chloride 96 (*)    CO2 13 (*)    Glucose, Bld 146 (*)    BUN 94 (*)    Creatinine, Ser 12.59 (*)    Calcium 6.9 (*)    Total Protein 8.7 (*)    Albumin 2.7 (*)    GFR, Estimated 4 (*)    Anion gap 20 (*)    All other components within normal limits  LIPASE, BLOOD  URINALYSIS, ROUTINE W REFLEX MICROSCOPIC    DG Chest Port 1 View  Final Result    CT ABDOMEN PELVIS WO CONTRAST    (Results Pending)    Medications  lactated ringers bolus 1,000 mL (has no administration in time range)  lactated ringers bolus 1,000 mL (0 mLs Intravenous Stopped 03/09/22 0510)     Procedures  /  Critical Care .Critical Care  Performed by: Maudie Flakes, MD Authorized by: Maudie Flakes, MD   Critical care provider statement:    Critical care time (minutes):  35    Critical care was necessary to treat or prevent imminent or life-threatening deterioration of the following conditions:  Renal failure   Critical care was time spent personally by me on the following activities:  Development of treatment plan with patient or surrogate, discussions with consultants, evaluation of patient's response to treatment, examination of patient, ordering and review of laboratory studies, ordering and review of radiographic studies, ordering and performing treatments and interventions, pulse oximetry, re-evaluation of patient's condition and review of old charts   ED Course and Medical Decision Making  Initial Impression and Ddx Suspect dehydration, other considerations include DKA, electrolyte disturbance, underlying infection given patient's history of HIV.  Patient is sitting in a very watery pool of diarrhea.  Past medical/surgical history that increases complexity of ED encounter: Stage IV CKD  Interpretation of Diagnostics I personally reviewed the EKG and my interpretation is as follows: Bundle branch block, prolonged QT  Labs reveal severe worsening of kidney function with uremia, creatinine of 12  Patient Reassessment and Ultimate Disposition/Management     Case discussed with Dr. [name] of nephrology, will place Foley, obtain CT abdomen, will send to [facility] for admission given possible need for dialysis.  Patient management required discussion with the following services or consulting groups:  Hospitalist Service and Nephrology  Complexity of Problems Addressed Acute illness or injury that poses threat of life of bodily function  Additional Data Reviewed and Analyzed Further history obtained from: Prior labs/imaging results  Additional Factors Impacting ED Encounter Risk Consideration of hospitalization  Jceon Alverio. [place] Small, [place] [email or web address]  Final Clinical Impressions(s) / ED  Diagnoses     ICD-10-CM   1. Diarrhea of presumed infectious origin  R19.7     2. Malaise  R53.81     3. Acute renal failure superimposed on chronic kidney disease, unspecified acute renal failure type, unspecified CKD stage (HCC)  N17.9    N18.9       ED Discharge Orders     None        Discharge Instructions Discussed with and Provided to Patient:   Discharge Instructions   None      Maudie Flakes, MD 03/09/22 [phone]

## 2022-03-09 NOTE — ED Notes (Signed)
Pt. Belongings(money) locked up with security.

## 2022-03-09 NOTE — ED Notes (Signed)
Pt refuses Foley Catheter

## 2022-03-09 NOTE — ED Triage Notes (Signed)
Pt states he has been weak and "not feeling well.  Pt has known slurred speech and Left BKA.

## 2022-03-09 NOTE — ED Notes (Signed)
Pt. Made aware for the need of urine specimen. 

## 2022-03-09 NOTE — H&P (Signed)
History and Physical    Patient: Keith [name]:[id number] DOB: 1966-02-27 DOA: 03/09/2022 DOS: the patient was seen and examined on 03/09/2022 [name], NP  Patient coming from: Home  Chief Complaint:  Chief Complaint  Patient presents with   Weakness   HPI: Keith [name] is a 56 y.o. male with medical history significant of diabetic foot ulcers eventually resulting in left BKA, then left AKA, right BKA, CAD, history of STEMI, stage IV CKD, anemia renal disease, HIV/AIDS, chronic knee pain, history of other nonhemorrhagic stroke, poorly controlled type 2 diabetes, aorta dilatation, rectal dysfunction, genital warts, GERD, hyperlipidemia, hypertension, diabetic peripheral neuropathy, history of pneumonia who is coming to the emergency department due to generalized weakness after having multiple episodes of diarrhea every day for the past 5 days associated with abdominal bloating and mild nausea. No abdominal pain, emesis, constipation, melena or hematochezia.  No flank pain, dysuria, frequency or hematuria. No sick contacts or travel history. He denied fever, chills, rhinorrhea, sore throat, wheezing or hemoptysis.  No chest pain, palpitations, diaphoresis, PND, orthopnea or pitting edema of the lower extremities.   No polyuria, polydipsia, polyphagia or blurred vision.   ED course: Initial vital signs were temperature 98 F, pulse 80, respiration 18, BP 107/63 mmHg O2 sat 100% on room air.  The patient received 2000 mL of LR bolus.  I added another 1000 mL, KCl 10 mEq IVPB x 2 and 2 mg of Imodium.  Lab work: His urinalysis with moderate hemoglobinuria, proteinuria more than 300 mg/dL moderate leukocyte esterase with 11-20 WBC and many bacteria on microscopic examination.  CBC with a white count of 10.0, hemoglobin 11.2 g/dL platelets 290.  CMP showed a sodium 129, potassium 3.2, chloride 96 and CO2 13 mmol/L.  Anion gap was 20.  Glucose 146, BUN 94, creatinine 12.59 and calcium 8.2  mg/dL after correction.  Total protein was 8.7 and albumin 2.7 g/dL.  The rest of the LFTs were normal.  Imaging: Portable 1 view chest radiograph with no active disease.  CT abdomen/pelvis without contrast with cholelithiasis with questionable gallbladder wall thickening and suspicion for acute cholecystitis versus artifact.  Otherwise no acute abnormality.  RUQ [place] without normal gallbladder/, cholelithiasis and wall thickening.   Review of Systems: As mentioned in the history of present illness. All other systems reviewed and are negative. Past Medical History:  Diagnosis Date   Abscess of right foot    abscess/ulcer of R transtibial amputation requiring IV abx   Acute ST elevation myocardial infarction (STEMI) due to occlusion of circumflex coronary artery ([place]) 03/22/2020   AIDS ([place])    Anemia    CAD (coronary artery disease)    a. MI with stenting of OM1 in 11/2018 with residual disease. b. acute STEMI 03/2020 s/p DES to OM1   Chronic anemia    Chronic knee pain    right   Chronic pain    CKD (chronic kidney disease), stage IV (HCC)    CVA (cerebral vascular accident) ([place]) 04/2020   Diabetes type 2, uncontrolled    HgA1c 17.6 (04/27/2010)   Diabetic foot ulcer ([place]) 01/2017   right foot   Dilatation of aorta (HCC)    Erectile dysfunction    Genital warts    GERD (gastroesophageal reflux disease)    History of blood transfusion    HIV (human immunodeficiency virus infection) ([place]) 2009   CD4 count 100, VL 13800 (05/01/2010)   Hyperlipidemia    Hypertension  Myocardial infarction [facility])    Neuropathy    Noncompliance with medication regimen    Osteomyelitis ([place])    h/o hand   Osteomyelitis of metatarsal ([place]) 04/28/2017   Pneumonia    STEMI (ST elevation myocardial infarction) ([place]) 11/10/2018   Past Surgical History:  Procedure Laterality Date   AMPUTATION Right 05/02/2017   Procedure: AMPUTATION TRANSMETARSAL;  Surgeon: [name], MD;  Location: [place];  Service:  Orthopedics;  Laterality: Right;   AMPUTATION Right 06/13/2017   Procedure: RIGHT BELOW KNEE AMPUTATION;  Surgeon: [name], MD;  Location: [place];  Service: Orthopedics;  Laterality: Right;   AMPUTATION Left 10/27/2020   Procedure: LEFT BELOW KNEE AMPUTATION;  Surgeon: [name], MD;  Location: [place];  Service: Orthopedics;  Laterality: Left;   AMPUTATION Left 11/17/2020   Procedure: REVISION AMPUTATION BELOW KNEE, LEFT;  Surgeon: [name], MD;  Location: [place];  Service: Orthopedics;  Laterality: Left;   AMPUTATION Left 01/03/2021   Procedure: LEFT ABOVE KNEE AMPUTATION;  Surgeon: [name], MD;  Location: [place];  Service: Orthopedics;  Laterality: Left;   APPLICATION OF WOUND VAC Left 10/27/2020   Procedure: APPLICATION OF WOUND VAC;  Surgeon: [name], MD;  Location: [place];  Service: Orthopedics;  Laterality: Left;   BELOW KNEE LEG AMPUTATION Right 06/13/2017   BELOW KNEE LEG AMPUTATION     CORONARY STENT INTERVENTION N/A 11/10/2018   Procedure: CORONARY STENT INTERVENTION;  Surgeon: Leonie Man, MD;  Location: [place] [facility];  Service: Cardiovascular;  Laterality: N/A;   CORONARY/GRAFT ACUTE MI REVASCULARIZATION N/A 03/21/2020   Procedure: Coronary/Graft Acute MI Revascularization;  Surgeon: [name], MD;  Location: [place] [facility];  Service: Cardiovascular;  Laterality: N/A;   CYSTOSCOPY N/A 11/09/2020   Procedure: CYSTOSCOPY, EVACUATION OF CLOTS, FULGERATION OF BLADDER AND BILATERIAL RETROGRADE PYELOGRAMS;  Surgeon: Janith Lima, MD;  Location: [place];  Service: Urology;  Laterality: N/A;   HERNIA REPAIR     I & D EXTREMITY Left 08/21/2014   Procedure: INCISION AND DRAINAGE LEFT SMALL FINGER;  Surgeon: Leanora Cover, MD;  Location: [place];  Service: Orthopedics;  Laterality: Left;   I & D EXTREMITY Right 03/18/2017   Procedure: IRRIGATION AND DEBRIDEMENT EXTREMITY;  Surgeon: [name], MD;  Location: [place];  Service: Orthopedics;  Laterality:  Right;   IR FLUORO GUIDE CV LINE RIGHT  11/02/2020   IR REMOVAL TUN CV CATH W/O FL  11/13/2020   IR US GUIDE VASC ACCESS RIGHT  11/02/2020   LEFT HEART CATH AND CORONARY ANGIOGRAPHY N/A 11/10/2018   Procedure: LEFT HEART CATH AND CORONARY ANGIOGRAPHY;  Surgeon: Leonie Man, MD;  Location: [place] [facility];  Service: Cardiovascular;  Laterality: N/A;   LEFT HEART CATH AND CORONARY ANGIOGRAPHY N/A 03/21/2020   Procedure: LEFT HEART CATH AND CORONARY ANGIOGRAPHY;  Surgeon: [name], MD;  Location: [place] [facility];  Service: Cardiovascular;  Laterality: N/A;   MINOR IRRIGATION AND DEBRIDEMENT OF WOUND Right 04/22/2014   Procedure: IRRIGATION AND DEBRIDEMENT OF RIGHT NECK ABCESS;  Surgeon: Jerrell Belfast, MD;  Location: [place];  Service: ENT;  Laterality: Right;   MULTIPLE EXTRACTIONS WITH ALVEOLOPLASTY N/A 01/18/2013   Procedure: MULTIPLE EXTRACION 3, 6, 7, 10, 11, 13, 21, 22, 27, 28, 29, 30 WITH ALVEOLOPLASTY;  Surgeon: Gae Bon, DDS;  Location: [place];  Service: Oral Surgery;  Laterality: N/A;   SKIN SPLIT GRAFT Right 03/21/2017  Procedure: IRRIGATION AND DEBRIDEMENT RIGHT FOOT AND APPLY SPLIT THICKNESS SKIN GRAFT AND WOUND VAC;  Surgeon: [name], MD;  Location: [place];  Service: Orthopedics;  Laterality: Right;   STUMP REVISION Left 12/02/2020   Procedure: REVISION LEFT BELOW KNEE AMPUTATION;  Surgeon: [name], MD;  Location: [place];  Service: Orthopedics;  Laterality: Left;   TEE WITHOUT CARDIOVERSION N/A 05/02/2017   Procedure: TRANSESOPHAGEAL ECHOCARDIOGRAM (TEE);  Surgeon: Acie Fredrickson Wonda Cheng, MD;  Location: [facility] OR;  Service: Cardiovascular;  Laterality: N/A;  coincidental to orthopedic case   Social History:  reports that he has never smoked. He has never used smokeless tobacco. He reports that he does not currently use alcohol. He reports that he does not use drugs.  Allergies  Allergen Reactions   Elemental Sulfur Itching    Patient stated he's allergic to  "sulfur" AND "sulfa"   Metformin And Related Other (See Comments)    Stopped by MD- affected kidneys   Zetia [Ezetimibe] Nausea Only   Sulfa Antibiotics Itching    Family History  Problem Relation Age of Onset   Hypertension Mother    Arthritis Father    Hypertension Father    Hypertension Brother    Cancer Maternal Grandmother 37       unknown type of cancer   Depression Paternal Grandmother     Prior to Admission medications   Medication Sig Start Date End Date Taking? Authorizing Provider  Accu-Chek Softclix Lancets lancets Use as directed to check blood glucose 4 times a day. 01/01/22   Shelly Coss, MD  allopurinol (ZYLOPRIM) 100 MG tablet Take 100 mg by mouth daily.    [provider]  amLODipine (NORVASC) 10 MG tablet Take 10 mg by mouth daily.    [provider]  aspirin EC 81 MG tablet Take 81 mg by mouth daily. Swallow whole.    [provider]  atorvastatin (LIPITOR) 80 MG tablet Take 1 tablet (80 mg total) by mouth daily. 09/01/20 01/02/22  [name], MD  blood glucose meter kit and supplies KIT Use to check blood glucose up to four times daily as directed. 01/01/22   Shelly Coss, MD  carvedilol (COREG) 3.125 MG tablet Take 3.125 mg by mouth 2 (two) times daily with a meal.    [provider]  clopidogrel (PLAVIX) 75 MG tablet Take 1 tablet (75 mg total) by mouth daily. 11/15/21   Jettie Booze, MD  cyclobenzaprine (FLEXERIL) 5 MG tablet Take 5 mg by mouth 3 (three) times daily as needed for muscle spasms. 12/20/21   [provider]  docusate sodium (COLACE) 100 MG capsule Take 1 capsule (100 mg total) by mouth 2 (two) times daily as needed for mild constipation (take when no BM for 1 day.). Patient not taking: Reported on 12/18/2021 12/15/21 12/15/22  [name], MD  dolutegravir-lamiVUDine (DOVATO) 50-300 MG tablet Take 1 tablet by mouth daily. 02/13/21   Golden Circle, FNP  doravirine (PIFELTRO) 100 MG TABS tablet  Take 1 tablet (100 mg total) by mouth daily. 02/13/21   Golden Circle, FNP  escitalopram (LEXAPRO) 20 MG tablet Take 1 tablet (20 mg total) by mouth daily. 10/19/21   Hosie Poisson, MD  ferrous sulfate 325 (65 FE) MG EC tablet Take 325 mg by mouth daily with breakfast.    [provider]  ferrous sulfate 325 (65 FE) MG tablet Take 1 tablet (325 mg total) by mouth every Monday, Wednesday, and Friday at 6  PM. Do not take with other medicines. 10/19/21 12/18/21  Hosie Poisson, MD  finasteride (PROSCAR) 5 MG tablet Take 5 mg by mouth daily. 08/20/21   [provider]  gabapentin (NEURONTIN) 100 MG capsule Take 1 capsule (100 mg total) by mouth 3 (three) times daily. 02/03/22   Azucena Cecil, PA-C  glucose blood (ACCU-CHEK GUIDE) test strip Use as directed to check blood glucose 4 times a day. 01/01/22   Shelly Coss, MD  HUMALOG KWIKPEN 100 UNIT/ML KwikPen Inject 0-15 Units into the skin See admin instructions. Inject 0-15 units into the skin three times a day with meals, per sliding scale: CBG 70 - 120: 0 units; CBG 121 - 150: 2 units; CBG 151 - 200: 3 units; CBG 201 - 250: 5 units; CBG 251 - 300: 8 units; CBG 301 - 350: 11 units; CBG 351 - 400: 15 units    [provider]  hydrALAZINE (APRESOLINE) 10 MG tablet Take 10 mg by mouth 2 (two) times daily.    [provider]  hydrochlorothiazide (HYDRODIURIL) 12.5 MG tablet Take 12.5 mg by mouth daily.    [provider]  insulin glargine (LANTUS) 100 UNIT/ML Solostar Pen Inject 38 Units into the skin daily. 01/01/22   Shelly Coss, MD  insulin lispro (HUMALOG KWIKPEN) 100 UNIT/ML KwikPen Inject 10 Units into the skin 3 (three) times daily. 01/01/22   Shelly Coss, MD  Insulin Pen Needle (UNIFINE PENTIPS) 32G X 6 MM MISC Use up to 4 pen needles per day as directed. 01/01/22   Shelly Coss, MD  Lancets [facility] ULTRASOFT) lancets Use as instructed 02/16/20   [name], FNP  losartan (COZAAR) 50  MG tablet Take 50 mg by mouth daily.    [provider]  metFORMIN (GLUCOPHAGE) 1000 MG tablet Take 1,000 mg by mouth 2 (two) times daily. 12/21/21   [provider]  nitroGLYCERIN (NITROSTAT) 0.4 MG SL tablet Place 0.4 mg under the tongue every 5 (five) minutes as needed for chest pain.    [provider]  oxyCODONE (ROXICODONE) 15 MG immediate release tablet Take 15 mg by mouth 4 (four) times daily. 12/20/21   [provider]  oxyCODONE-acetaminophen (PERCOCET/ROXICET) 5-325 MG tablet Take 1 tablet by mouth every 8 (eight) hours as needed for severe pain. 02/03/22   Azucena Cecil, PA-C  pregabalin (LYRICA) 50 MG capsule Take 50 mg by mouth 2 (two) times daily. 12/20/21   [provider]  sodium bicarbonate 650 MG tablet Take 1 tablet (650 mg total) by mouth 3 (three) times daily. Patient taking differently: Take 650 mg by mouth 2 (two) times daily. 12/15/21   [name], MD    Physical Exam: Vitals:   03/09/22 0630 03/09/22 0700 03/09/22 0735 03/09/22 0840  BP: 117/68 (!) 110/97 107/70 100/62  Pulse: 91 86 60 72  Resp: 18 18 (!) 21   Temp:      TempSrc:      SpO2: 100% 99% 92% 98%   Physical Exam Vitals and nursing note reviewed.  Constitutional:      General: He is awake. He is not in acute distress.    Appearance: He is obese. He is ill-appearing.  HENT:     Head: Normocephalic.     Nose: No rhinorrhea.     Mouth/Throat:     Mouth: Mucous membranes are dry.  Eyes:     General: No scleral icterus.    Pupils: Pupils are equal, round, and reactive to  light.  Neck:     Vascular: No JVD.  Cardiovascular:     Rate and Rhythm: Normal rate and regular rhythm.     Heart sounds: S1 normal and S2 normal.  Pulmonary:     Effort: Pulmonary effort is normal.     Breath sounds: Normal breath sounds. No wheezing, rhonchi or rales.  Abdominal:     General: Bowel sounds are normal. There is no distension.     Palpations: Abdomen is  soft.     Tenderness: There is abdominal tenderness in the right upper quadrant. There is no guarding or rebound.  Musculoskeletal:     Cervical back: Neck supple.     Right lower leg: No edema.     Left lower leg: No edema.     Right Lower Extremity: Right leg is amputated below knee.     Left Lower Extremity: Left leg is amputated above knee.  Skin:    General: Skin is warm and dry.  Neurological:     General: No focal deficit present.     Mental Status: He is alert and oriented to person, place, and time.  Psychiatric:        Mood and Affect: Mood normal.        Behavior: Behavior is cooperative.   Data Reviewed:  Results are pending, will review when available.  Assessment and Plan: Principal Problem:   AKI (acute kidney injury) ([place]) Superimposed on:   CKD (chronic kidney disease) stage 4, GFR 15-29 ml/min (HCC) PCU/inpatient. IV fluids per nephrology. Hold ARB/ACE. Hold antihypertensives. Avoid hypotension. Avoid nephrotoxins. Monitor intake and output. Monitor renal function electrolytes. Will be seen by nephrology. Transfer to [facility].  Active Problems:   Diarrhea   Gallbladder anomaly RUQ ultrasound with gallbladder wall thickening. Will cover with ceftriaxone and metronidazole IV. Check gastrointestinal pathogen by PCR.    HTN (hypertension) Blood pressure has been low. Hold antihypertensives. Monitor BP closely.    HIV disease ([place]) Resume antiretroviral once med rec performed.    CAD (coronary artery disease) Continue aspirin and clopidogrel. Hold beta-blocker and statin.    BPH (benign prostatic hyperplasia) On finasteride.    GERD (gastroesophageal reflux disease) Pantoprazole 40 mg p.o. daily.     Advance Care Planning:   Code Status: Full Code   Consults: Nephrology (Dr. Katheren Puller).  Family Communication:   Severity of Illness: The appropriate patient status for this patient is INPATIENT. Inpatient status is judged to  be reasonable and necessary in order to provide the required intensity of service to ensure the patient's safety. The patient's presenting symptoms, physical exam findings, and initial radiographic and laboratory data in the context of their chronic comorbidities is felt to place them at high risk for further clinical deterioration. Furthermore, it is not anticipated that the patient will be medically stable for discharge from the hospital within 2 midnights of admission.   * I certify that at the point of admission it is my clinical judgment that the patient will require inpatient hospital care spanning beyond 2 midnights from the point of admission due to high intensity of service, high risk for further deterioration and high frequency of surveillance required.*  Author: Reubin Milan, MD 03/09/2022 8:49 AM  For on call review [email or web address].   This document was prepared using Dragon voice recognition software and may contain some unintended transcription errors.

## 2022-03-09 NOTE — Consult Note (Signed)
[facility] Renal Consultation Note  Requesting MD: Gerlene Fee, MD Indication for Consultation:  AKI  Chief complaint: weakness and diarrhea   HPI:  DARVIS CROFT is a 56 y.o. male with a history of HIV, CAD with hx STEMI, CKD stage IV, DM type 2, HTN, and prior CVA who presented to the hospital with generalized weakness and watery, non-bloody diarrhea.  He was found to have creatinine markedly elevated at 12.59.  He has been given LR boluses.  ED physician called me and I recommended transfer to [facility] given anticipated need for possible dialysis.  Unfortunately, labs that I ordered at noon are still not available.  Spoke with RN and she has sent labs down and they have intermittently been pending. Creatinine trends are below - baseline Cr 3.5 -4.0.  past meds include losartan and HCTZ.  He was seen by [facility] inpatient in July for AKI felt 2/2 ATN from sustained pre-renal insults.  He was last seen in hospital follow-up in July by Dr. Joelyn Oms.  He was assessed as having CKD stage IV and a high risk of progression to ESRD; he was felt to only be a candidate for in-center hemo when dialysis was needed.  He was to follow-up in one month then was lost to follow-up.  He does not have dialysis access.  He was directed to the ER for hyperkalemia at one point per charting.  He lives in a motel.  Would want dialysis if needed but hopes it hasn't come to that.  We discussed things didn't look good.  He did have some relief from the foley he states.  Strict ins/outs are not available.      Creatinine, Ser  Date/Time Value Ref Range Status  03/09/2022 01:15 AM 12.59 (H) 0.61 - 1.24 mg/dL Final  02/12/2022 05:16 AM 3.78 (H) 0.61 - 1.24 mg/dL Final  02/02/2022 10:48 PM 3.49 (H) 0.61 - 1.24 mg/dL Final  01/10/2022 09:23 PM 3.26 (H) 0.61 - 1.24 mg/dL Final  12/30/2021 08:43 AM 2.75 (H) 0.61 - 1.24 mg/dL Final  12/30/2021 12:34 AM 2.79 (H) 0.61 - 1.24 mg/dL Final  12/29/2021 09:29 PM 3.14 (H) 0.61 -  1.24 mg/dL Final  12/29/2021 05:25 PM 3.32 (H) 0.61 - 1.24 mg/dL Final  12/20/2021 03:15 AM 3.77 (H) 0.61 - 1.24 mg/dL Final  12/19/2021 02:59 AM 3.24 (H) 0.61 - 1.24 mg/dL Final  12/18/2021 04:41 AM 3.45 (H) 0.61 - 1.24 mg/dL Final  12/15/2021 06:38 AM 3.31 (H) 0.61 - 1.24 mg/dL Final  12/14/2021 10:05 PM 3.55 (H) 0.61 - 1.24 mg/dL Final  12/14/2021 08:14 AM 3.86 (H) 0.61 - 1.24 mg/dL Final  12/08/2021 05:32 AM 4.06 (H) 0.61 - 1.24 mg/dL Final  12/07/2021 07:39 AM 3.86 (H) 0.61 - 1.24 mg/dL Final  12/06/2021 09:53 PM 3.76 (H) 0.61 - 1.24 mg/dL Final  12/06/2021 06:12 PM 3.66 (H) 0.61 - 1.24 mg/dL Final  12/06/2021 12:03 PM 3.68 (H) 0.61 - 1.24 mg/dL Final  12/06/2021 07:53 AM 3.84 (H) 0.61 - 1.24 mg/dL Final  12/06/2021 04:51 AM 3.89 (H) 0.61 - 1.24 mg/dL Final  12/05/2021 11:07 PM 3.80 (H) 0.61 - 1.24 mg/dL Final  11/20/2021 06:40 PM 4.02 (H) 0.61 - 1.24 mg/dL Final  10/19/2021 05:43 AM 4.18 (H) 0.61 - 1.24 mg/dL Final  10/17/2021 03:50 AM 3.84 (H) 0.61 - 1.24 mg/dL Final  10/16/2021 04:23 AM 4.39 (H) 0.61 - 1.24 mg/dL Final  10/15/2021 01:46 AM 4.53 (H) 0.61 - 1.24 mg/dL Final  10/14/2021 01:17 AM  4.56 (H) 0.61 - 1.24 mg/dL Final  10/13/2021 02:36 AM 4.44 (H) 0.61 - 1.24 mg/dL Final  10/12/2021 03:16 AM 4.02 (H) 0.61 - 1.24 mg/dL Final  10/11/2021 02:30 AM 3.65 (H) 0.61 - 1.24 mg/dL Final  10/10/2021 04:34 AM 3.62 (H) 0.61 - 1.24 mg/dL Final  10/09/2021 06:20 PM 3.39 (H) 0.61 - 1.24 mg/dL Final  10/09/2021 02:40 PM 3.31 (H) 0.61 - 1.24 mg/dL Final  10/09/2021 08:38 AM 3.34 (H) 0.61 - 1.24 mg/dL Final  10/08/2021 04:20 PM 3.76 (H) 0.61 - 1.24 mg/dL Final  09/10/2021 01:54 PM 2.79 (H) 0.61 - 1.24 mg/dL Final  08/24/2021 01:23 AM 3.38 (H) 0.61 - 1.24 mg/dL Final  08/23/2021 06:29 AM 2.98 (H) 0.61 - 1.24 mg/dL Final  08/22/2021 12:31 AM 3.60 (H) 0.61 - 1.24 mg/dL Final  01/05/2021 12:39 AM 2.18 (H) 0.61 - 1.24 mg/dL Final  01/04/2021 03:56 AM 1.99 (H) 0.61 - 1.24 mg/dL Final   01/03/2021 10:01 AM 2.04 (H) 0.61 - 1.24 mg/dL Final  12/19/2020 07:20 AM 2.53 (H) 0.61 - 1.24 mg/dL Final  12/18/2020 12:46 AM 2.87 (H) 0.61 - 1.24 mg/dL Final  12/17/2020 03:08 AM 2.70 (H) 0.61 - 1.24 mg/dL Final  12/16/2020 01:00 AM 2.55 (H) 0.61 - 1.24 mg/dL Final  12/15/2020 07:29 PM 2.50 (H) 0.61 - 1.24 mg/dL Final  12/15/2020 01:12 AM 2.18 (H) 0.61 - 1.24 mg/dL Final  12/14/2020 02:22 AM 1.98 (H) 0.61 - 1.24 mg/dL Final  12/14/2020 02:22 AM 1.92 (H) 0.61 - 1.24 mg/dL Final  12/13/2020 01:23 AM 2.11 (H) 0.61 - 1.24 mg/dL Final     PMHx:   Past Medical History:  Diagnosis Date   Abscess of right foot    abscess/ulcer of R transtibial amputation requiring IV abx   Acute ST elevation myocardial infarction (STEMI) due to occlusion of circumflex coronary artery ([place]) 03/22/2020   AIDS ([place])    Anemia    CAD (coronary artery disease)    a. MI with stenting of OM1 in 11/2018 with residual disease. b. acute STEMI 03/2020 s/p DES to OM1   Chronic anemia    Chronic knee pain    right   Chronic pain    CKD (chronic kidney disease), stage IV (HCC)    CVA (cerebral vascular accident) ([place]) 04/2020   Diabetes type 2, uncontrolled    HgA1c 17.6 (04/27/2010)   Diabetic foot ulcer ([place]) 01/2017   right foot   Dilatation of aorta (HCC)    Erectile dysfunction    Genital warts    GERD (gastroesophageal reflux disease)    History of blood transfusion    HIV (human immunodeficiency virus infection) ([name]) 2009   CD4 count 100, VL 13800 (05/01/2010)   Hyperlipidemia    Hypertension    Myocardial infarction [facility])    Neuropathy    Noncompliance with medication regimen    Osteomyelitis ([place])    h/o hand   Osteomyelitis of metatarsal ([place]) 04/28/2017   Pneumonia    STEMI (ST elevation myocardial infarction) ([place]) 11/10/2018    Past Surgical History:  Procedure Laterality Date   AMPUTATION Right 05/02/2017   Procedure: AMPUTATION TRANSMETARSAL;  Surgeon: [name], MD;  Location:  [place];  Service: Orthopedics;  Laterality: Right;   AMPUTATION Right 06/13/2017   Procedure: RIGHT BELOW KNEE AMPUTATION;  Surgeon: [name], MD;  Location: [place];  Service: Orthopedics;  Laterality: Right;   AMPUTATION Left 10/27/2020   Procedure: LEFT BELOW KNEE AMPUTATION;  Surgeon: [name],  MD;  Location: [place];  Service: Orthopedics;  Laterality: Left;   AMPUTATION Left 11/17/2020   Procedure: REVISION AMPUTATION BELOW KNEE, LEFT;  Surgeon: [name], MD;  Location: [place];  Service: Orthopedics;  Laterality: Left;   AMPUTATION Left 01/03/2021   Procedure: LEFT ABOVE KNEE AMPUTATION;  Surgeon: [name], MD;  Location: [place];  Service: Orthopedics;  Laterality: Left;   APPLICATION OF WOUND VAC Left 10/27/2020   Procedure: APPLICATION OF WOUND VAC;  Surgeon: [name], MD;  Location: [place];  Service: Orthopedics;  Laterality: Left;   BELOW KNEE LEG AMPUTATION Right 06/13/2017   BELOW KNEE LEG AMPUTATION     CORONARY STENT INTERVENTION N/A 11/10/2018   Procedure: CORONARY STENT INTERVENTION;  Surgeon: Leonie Man, MD;  Location: [place] [facility];  Service: Cardiovascular;  Laterality: N/A;   CORONARY/GRAFT ACUTE MI REVASCULARIZATION N/A 03/21/2020   Procedure: Coronary/Graft Acute MI Revascularization;  Surgeon: [name], MD;  Location: [place] [facility];  Service: Cardiovascular;  Laterality: N/A;   CYSTOSCOPY N/A 11/09/2020   Procedure: CYSTOSCOPY, EVACUATION OF CLOTS, FULGERATION OF BLADDER AND BILATERIAL RETROGRADE PYELOGRAMS;  Surgeon: Janith Lima, MD;  Location: [place];  Service: Urology;  Laterality: N/A;   HERNIA REPAIR     I & D EXTREMITY Left 08/21/2014   Procedure: INCISION AND DRAINAGE LEFT SMALL FINGER;  Surgeon: Leanora Cover, MD;  Location: [place];  Service: Orthopedics;  Laterality: Left;   I & D EXTREMITY Right 03/18/2017   Procedure: IRRIGATION AND DEBRIDEMENT EXTREMITY;  Surgeon: [name], MD;  Location: [place];  Service: Orthopedics;   Laterality: Right;   IR FLUORO GUIDE CV LINE RIGHT  11/02/2020   IR REMOVAL TUN CV CATH W/O FL  11/13/2020   IR US GUIDE VASC ACCESS RIGHT  11/02/2020   LEFT HEART CATH AND CORONARY ANGIOGRAPHY N/A 11/10/2018   Procedure: LEFT HEART CATH AND CORONARY ANGIOGRAPHY;  Surgeon: Leonie Man, MD;  Location: [place] [facility];  Service: Cardiovascular;  Laterality: N/A;   LEFT HEART CATH AND CORONARY ANGIOGRAPHY N/A 03/21/2020   Procedure: LEFT HEART CATH AND CORONARY ANGIOGRAPHY;  Surgeon: [name], MD;  Location: [place] [facility];  Service: Cardiovascular;  Laterality: N/A;   MINOR IRRIGATION AND DEBRIDEMENT OF WOUND Right 04/22/2014   Procedure: IRRIGATION AND DEBRIDEMENT OF RIGHT NECK ABCESS;  Surgeon: Jerrell Belfast, MD;  Location: [place];  Service: ENT;  Laterality: Right;   MULTIPLE EXTRACTIONS WITH ALVEOLOPLASTY N/A 01/18/2013   Procedure: MULTIPLE EXTRACION 3, 6, 7, 10, 11, 13, 21, 22, 27, 28, 29, 30 WITH ALVEOLOPLASTY;  Surgeon: Gae Bon, DDS;  Location: [place];  Service: Oral Surgery;  Laterality: N/A;   SKIN SPLIT GRAFT Right 03/21/2017   Procedure: IRRIGATION AND DEBRIDEMENT RIGHT FOOT AND APPLY SPLIT THICKNESS SKIN GRAFT AND WOUND VAC;  Surgeon: [name], MD;  Location: [place];  Service: Orthopedics;  Laterality: Right;   STUMP REVISION Left 12/02/2020   Procedure: REVISION LEFT BELOW KNEE AMPUTATION;  Surgeon: [name], MD;  Location: [place];  Service: Orthopedics;  Laterality: Left;   TEE WITHOUT CARDIOVERSION N/A 05/02/2017   Procedure: TRANSESOPHAGEAL ECHOCARDIOGRAM (TEE);  Surgeon: Acie Fredrickson Wonda Cheng, MD;  Location: [facility];  Service: Cardiovascular;  Laterality: N/A;  coincidental to orthopedic case    Family Hx:  Family History  Problem Relation Age of Onset   Hypertension Mother    Arthritis Father    Hypertension Father  Hypertension Brother    Cancer Maternal Grandmother 9       unknown type of cancer   Depression Paternal Grandmother     Social  History:  reports that he has never smoked. He has never used smokeless tobacco. He reports that he does not currently use alcohol. He reports that he does not use drugs.  Allergies:  Allergies  Allergen Reactions   Elemental Sulfur Itching    Patient stated he's allergic to "sulfur" AND "sulfa"   Metformin And Related Other (See Comments)    Stopped by MD- affected kidneys   Zetia [Ezetimibe] Nausea Only   Sulfa Antibiotics Itching    Medications: Prior to Admission medications   Medication Sig Start Date End Date Taking? Authorizing Provider  pregabalin (LYRICA) 150 MG capsule Take 150 mg by mouth 2 (two) times daily. 02/18/22  Yes [provider]  Accu-Chek Softclix Lancets lancets Use as directed to check blood glucose 4 times a day. 01/01/22   Shelly Coss, MD  allopurinol (ZYLOPRIM) 100 MG tablet Take 100 mg by mouth daily.    [provider]  amLODipine (NORVASC) 10 MG tablet Take 10 mg by mouth daily.    [provider]  aspirin EC 81 MG tablet Take 81 mg by mouth daily. Swallow whole.    [provider]  atorvastatin (LIPITOR) 80 MG tablet Take 1 tablet (80 mg total) by mouth daily. 09/01/20 03/09/22  [name], MD  blood glucose meter kit and supplies KIT Use to check blood glucose up to four times daily as directed. 01/01/22   Shelly Coss, MD  carvedilol (COREG) 3.125 MG tablet Take 3.125 mg by mouth 2 (two) times daily with a meal.    [provider]  clopidogrel (PLAVIX) 75 MG tablet Take 1 tablet (75 mg total) by mouth daily. 11/15/21   Jettie Booze, MD  cyclobenzaprine (FLEXERIL) 5 MG tablet Take 5 mg by mouth 3 (three) times daily as needed for muscle spasms. 12/20/21   [provider]  docusate sodium (COLACE) 100 MG capsule Take 1 capsule (100 mg total) by mouth 2 (two) times daily as needed for mild constipation (take when no BM for 1 day.). Patient not taking: Reported on 12/18/2021 12/15/21 12/15/22  [name], MD  dolutegravir-lamiVUDine (DOVATO) 50-300 MG tablet Take 1 tablet by mouth daily. 02/13/21   Golden Circle, FNP  doravirine (PIFELTRO) 100 MG TABS tablet Take 1 tablet (100 mg total) by mouth daily. 02/13/21   Golden Circle, FNP  escitalopram (LEXAPRO) 20 MG tablet Take 1 tablet (20 mg total) by mouth daily. 10/19/21   Hosie Poisson, MD  ferrous sulfate 325 (65 FE) MG EC tablet Take 325 mg by mouth daily with breakfast.    [provider]  ferrous sulfate 325 (65 FE) MG tablet Take 1 tablet (325 mg total) by mouth every Monday, Wednesday, and Friday at 6 PM. Do not take with other medicines. 10/19/21 03/09/22  Hosie Poisson, MD  finasteride (PROSCAR) 5 MG tablet Take 5 mg by mouth daily. 08/20/21   [provider]  gabapentin (NEURONTIN) 100 MG capsule Take 1 capsule (100 mg total) by mouth 3 (three) times daily. 02/03/22   Azucena Cecil, PA-C  glucose blood (ACCU-CHEK GUIDE) test strip Use as directed to check blood glucose 4 times a day. 01/01/22   Shelly Coss, MD  HUMALOG KWIKPEN 100 UNIT/ML KwikPen Inject 0-15 Units into the skin See admin instructions. Inject 0-15 units  into the skin three times a day with meals, per sliding scale: CBG 70 - 120: 0 units; CBG 121 - 150: 2 units; CBG 151 - 200: 3 units; CBG 201 - 250: 5 units; CBG 251 - 300: 8 units; CBG 301 - 350: 11 units; CBG 351 - 400: 15 units    [provider]  hydrALAZINE (APRESOLINE) 10 MG tablet Take 10 mg by mouth 2 (two) times daily.    [provider]  hydrochlorothiazide (HYDRODIURIL) 12.5 MG tablet Take 12.5 mg by mouth daily.    [provider]  insulin glargine (LANTUS) 100 UNIT/ML Solostar Pen Inject 38 Units into the skin daily. 01/01/22   Shelly Coss, MD  insulin lispro (HUMALOG KWIKPEN) 100 UNIT/ML KwikPen Inject 10 Units into the skin 3 (three) times daily. 01/01/22   Shelly Coss, MD  Insulin Pen Needle (UNIFINE PENTIPS) 32G X 6 MM MISC Use up to 4 pen  needles per day as directed. 01/01/22   Shelly Coss, MD  Lancets [facility] ULTRASOFT) lancets Use as instructed 02/16/20   [name], FNP  losartan (COZAAR) 50 MG tablet Take 50 mg by mouth daily.    [provider]  metFORMIN (GLUCOPHAGE) 1000 MG tablet Take 1,000 mg by mouth 2 (two) times daily. 12/21/21   [provider]  nitroGLYCERIN (NITROSTAT) 0.4 MG SL tablet Place 0.4 mg under the tongue every 5 (five) minutes as needed for chest pain.    [provider]  oxyCODONE (ROXICODONE) 15 MG immediate release tablet Take 15 mg by mouth 4 (four) times daily. 12/20/21   [provider]  oxyCODONE-acetaminophen (PERCOCET/ROXICET) 5-325 MG tablet Take 1 tablet by mouth every 8 (eight) hours as needed for severe pain. 02/03/22   Azucena Cecil, PA-C  pregabalin (LYRICA) 50 MG capsule Take 50 mg by mouth 2 (two) times daily. 12/20/21   [provider]  sodium bicarbonate 650 MG tablet Take 1 tablet (650 mg total) by mouth 3 (three) times daily. Patient taking differently: Take 650 mg by mouth 2 (two) times daily. 12/15/21   [name], MD    I have reviewed the patient's current and reported prior to admission medications.  Labs:     Latest Ref Rng & Units 03/09/2022    1:15 AM 02/12/2022    5:16 AM 02/02/2022   10:48 PM  BMP  Glucose 70 - 99 mg/dL 146  588  432   BUN 6 - 20 mg/dL 94  45  48   Creatinine 0.61 - 1.24 mg/dL 12.59  3.78  3.49   Sodium 135 - 145 mmol/L 129  125  130   Potassium 3.5 - 5.1 mmol/L 3.2  4.3  3.8   Chloride 98 - 111 mmol/L 96  101  105   CO2 22 - 32 mmol/L _0 Calcium 8.9 - 10.3 mg/dL 6.9  7.9  7.9     Urinalysis    Component Value Date/Time   COLORURINE YELLOW 03/09/2022 [place] 03/09/2022 0643   LABSPEC 1.025 03/09/2022 0643   PHURINE 6.0 03/09/2022 0643   GLUCOSEU NEGATIVE 03/09/2022 0643   HGBUR MODERATE (A) 03/09/2022 0643   BILIRUBINUR NEGATIVE 03/09/2022 0643    BILIRUBINUR negative 09/07/2018 1034   KETONESUR NEGATIVE 03/09/2022 0643   PROTEINUR >300 (A) 03/09/2022 0643   UROBILINOGEN 1.0 09/07/2018 1034   UROBILINOGEN 1.0 01/01/2017 1032   NITRITE NEGATIVE 03/09/2022 0643   LEUKOCYTESUR MODERATE (A) 03/09/2022  [phone]     ROS:  Pertinent items noted in HPI and remainder of comprehensive ROS otherwise negative.  Physical Exam: Vitals:   03/09/22 1400 03/09/22 1442  BP: 90/63   Pulse: 83   Resp: 20   Temp:  98.2 F (36.8 C)  SpO2: 98%      General: adult male in bed in NAD HEENT: NCAT  Eyes: EOMI sclera anicteric Neck: supple trachea midline  Heart: S1S2 no rub Lungs: clear but reduced; normal work of breathing on room air  Abdomen: soft/obese/nt Extremities: bilateral BKA's Skin: no rash on extremities exposed  Neuro: alert and oriented x 3 provides hx and follows commands Psych normal mood and affect GU foley in place with some yellow/amber urine  Assessment/Plan:  # AKI  - with possible progression to ESRD. No hydro  - repeat labs ordered this am not back unfortunately - spoke with nursing and she is going to send more blood to the lab as his has apparently been lost.   - Continue foley  - he is on finasteride  - hold HCTZ and ARB/ace inhibitor - He is felt to be only a candidate for in-center HD - he is currently living in a motel    # CKD stage IV  - Secondary to DM  - Follows with Dr. Joelyn Oms at [facility] but was lost to follow-up  - high risk of progression to ESRD  #  HTN  - acceptable    # BPH  - continue finasteride - continue foley - he had relief with the foley - start flomax  # Hyponatremia - Mild and setting of AKI, dehydration - has gotten fluids  - Update labs  # Hypokalemia - repleted - Please avoid aggressive repletion given his AKI  # Metabolic acidosis - s/p LR boluses x multiple  - follow repeat labs and anticipate need for HD   # Anemia CKD  - No acute indication for PRBC's or indication  for ESA.  Suspect may worsen with dilution from fluids  # HIV  - has had for over 20 years per charting   Disposition - needs to transfer to [facility].  Discussed with ER MD this am.  Awaiting a bed  Claudia Desanctis 03/09/2022, 6:55 PM

## 2022-03-10 DIAGNOSIS — N179 Acute kidney failure, unspecified: Secondary | ICD-10-CM | POA: Diagnosis not present

## 2022-03-10 LAB — CBC WITH DIFFERENTIAL/PLATELET
Abs Immature Granulocytes: 0.04 10*3/uL (ref 0.00–0.07)
Basophils Absolute: 0 10*3/uL (ref 0.0–0.1)
Basophils Relative: 0 %
Eosinophils Absolute: 0 10*3/uL (ref 0.0–0.5)
Eosinophils Relative: 0 %
HCT: 32.2 % — ABNORMAL LOW (ref 39.0–52.0)
Hemoglobin: 10.4 g/dL — ABNORMAL LOW (ref 13.0–17.0)
Immature Granulocytes: 1 %
Lymphocytes Relative: 20 %
Lymphs Abs: 1.3 10*3/uL (ref 0.7–4.0)
MCH: 26.9 pg (ref 26.0–34.0)
MCHC: 32.3 g/dL (ref 30.0–36.0)
MCV: 83.2 fL (ref 80.0–100.0)
Monocytes Absolute: 0.5 10*3/uL (ref 0.1–1.0)
Monocytes Relative: 8 %
Neutro Abs: 4.7 10*3/uL (ref 1.7–7.7)
Neutrophils Relative %: 71 %
Platelets: 283 10*3/uL (ref 150–400)
RBC: 3.87 MIL/uL — ABNORMAL LOW (ref 4.22–5.81)
RDW: 12.7 % (ref 11.5–15.5)
WBC: 6.6 10*3/uL (ref 4.0–10.5)
nRBC: 0 % (ref 0.0–0.2)

## 2022-03-10 LAB — POTASSIUM
Potassium: 2.6 mmol/L — CL (ref 3.5–5.1)
Potassium: 2.7 mmol/L — CL (ref 3.5–5.1)

## 2022-03-10 LAB — RENAL FUNCTION PANEL
Albumin: 2.1 g/dL — ABNORMAL LOW (ref 3.5–5.0)
Anion gap: 20 — ABNORMAL HIGH (ref 5–15)
BUN: 88 mg/dL — ABNORMAL HIGH (ref 6–20)
CO2: 11 mmol/L — ABNORMAL LOW (ref 22–32)
Calcium: 6.2 mg/dL — CL (ref 8.9–10.3)
Chloride: 100 mmol/L (ref 98–111)
Creatinine, Ser: 12.88 mg/dL — ABNORMAL HIGH (ref 0.61–1.24)
GFR, Estimated: 4 mL/min — ABNORMAL LOW (ref >60)
Glucose, Bld: 142 mg/dL — ABNORMAL HIGH (ref 70–99)
Phosphorus: 8.5 mg/dL — ABNORMAL HIGH (ref 2.5–4.6)
Potassium: 2.5 mmol/L — CL (ref 3.5–5.1)
Sodium: 131 mmol/L — ABNORMAL LOW (ref 135–145)

## 2022-03-10 LAB — C DIFFICILE QUICK SCREEN W PCR REFLEX
C Diff antigen: NEGATIVE
C Diff interpretation: NOT DETECTED
C Diff toxin: NEGATIVE

## 2022-03-10 LAB — MAGNESIUM: Magnesium: 2 mg/dL (ref 1.7–2.4)

## 2022-03-10 LAB — GLUCOSE, CAPILLARY
Glucose-Capillary: 111 mg/dL — ABNORMAL HIGH (ref 70–99)
Glucose-Capillary: 140 mg/dL — ABNORMAL HIGH (ref 70–99)
Glucose-Capillary: 142 mg/dL — ABNORMAL HIGH (ref 70–99)

## 2022-03-10 MED ORDER — POTASSIUM CHLORIDE 10 MEQ/100ML IV SOLN
10.0000 meq | INTRAVENOUS | Status: AC
  Administered 2022-03-10 (×2): 10 meq via INTRAVENOUS
  Filled 2022-03-10 (×2): qty 100

## 2022-03-10 MED ORDER — LACTATED RINGERS IV BOLUS
500.0000 mL | Freq: Once | INTRAVENOUS | Status: AC
  Administered 2022-03-10: 500 mL via INTRAVENOUS

## 2022-03-10 MED ORDER — POTASSIUM CHLORIDE CRYS ER 20 MEQ PO TBCR
40.0000 meq | EXTENDED_RELEASE_TABLET | ORAL | Status: AC
  Administered 2022-03-10: 40 meq via ORAL
  Filled 2022-03-10: qty 2

## 2022-03-10 MED ORDER — CALCIUM GLUCONATE-NACL 1-0.675 GM/50ML-% IV SOLN
1.0000 g | Freq: Once | INTRAVENOUS | Status: AC
  Administered 2022-03-11: 1000 mg via INTRAVENOUS
  Filled 2022-03-10: qty 50

## 2022-03-10 MED ORDER — LACTATED RINGERS IV BOLUS: 500.0000 mL | Freq: Once | INTRAVENOUS | Status: AC

## 2022-03-10 MED ORDER — POTASSIUM CHLORIDE CRYS ER 20 MEQ PO TBCR
40.0000 meq | EXTENDED_RELEASE_TABLET | ORAL | Status: DC
  Administered 2022-03-10: 40 meq via ORAL
  Filled 2022-03-10: qty 2

## 2022-03-10 MED ORDER — CALCIUM GLUCONATE-NACL 1-0.675 GM/50ML-% IV SOLN
1.0000 g | Freq: Once | INTRAVENOUS | Status: DC
  Filled 2022-03-10: qty 50

## 2022-03-10 MED ORDER — CIPROFLOXACIN HCL 500 MG PO TABS
500.0000 mg | ORAL_TABLET | Freq: Every day | ORAL | Status: DC
  Administered 2022-03-10: 500 mg via ORAL
  Filled 2022-03-10: qty 1

## 2022-03-10 MED ORDER — LACTATED RINGERS IV SOLN: INTRAVENOUS | Status: AC

## 2022-03-10 NOTE — Progress Notes (Signed)
[facility] Progress Note  Name: Keith [name] MRN: [id number] DOB: [birth year]  Chief Complaint:  Weakness and diarrhea   Subjective:  Strict ins/outs are not available.  There was considerable delay in getting labs done and processed at [facility] on 12/2 which ultimately delayed his treatment for critical hypokalemia.  In the interim he was transferred to [facility] for anticipated dialysis needs.  I gave him LR 500 mL bolus today and started on a rate at 75 ml/hr.  I ordered potassium 40 meq x 2 doses and repeat K is in process.    He states that he is not yet ready to start dialysis - he's got to have another day to wrap his head around everything.  Wishes the kidneys and diarrhea would get better.  We discussed that not everyone who theoretically needs dialysis chooses to do it.    Review of systems:  He has had diarrhea - just had a BM and I have contacted the nursing station about some assistance for him  No n/v No chest pain  No shortness of breath   ----------------- Background on consult:  Keith [name] is a 56 y.o. male with a history of HIV, CAD with hx STEMI, CKD stage IV, DM type 2, HTN, and prior CVA who presented to the hospital with generalized weakness and watery, non-bloody diarrhea.  He was found to have creatinine markedly elevated at 12.59.  He has been given LR boluses.  ED physician called me and I recommended transfer to [facility] given anticipated need for possible dialysis.  Unfortunately, labs that I ordered at noon are still not available.  Spoke with RN and she has sent labs down and they have intermittently been pending. Creatinine trends are below - baseline Cr 3.5 -4.0.  past meds include losartan and HCTZ.  He was seen by [facility] inpatient in July for AKI felt 2/2 ATN from sustained pre-renal insults.  He was last seen in hospital follow-up in July by Dr. Joelyn Oms.  He was assessed as having CKD stage IV and a high risk of progression to ESRD; he was felt to  only be a candidate for in-center hemo when dialysis was needed.  He was to follow-up in one month then was lost to follow-up.  He does not have dialysis access.  He was directed to the ER for hyperkalemia at one point per charting.  He lives in a motel.  Would want dialysis if needed but hopes it hasn't come to that.  We discussed things didn't look good.  He did have some relief from the foley he states.  Strict ins/outs are not available.     Intake/Output Summary (Last 24 hours) at 03/10/2022 1610 Last data filed at 03/10/2022 0100 Gross per 24 hour  Intake 336 ml  Output --  Net 336 ml    Vitals:  Vitals:   03/10/22 0447 03/10/22 0837 03/10/22 1158 03/10/22 1440  BP: 114/74 94/74 130/68 111/66  Pulse: 83 80 92   Resp: 18 18 18 18   Temp: 97.9 F (36.6 C) 99.3 F (37.4 C) (!) 97.5 F (36.4 C)   TempSrc: Oral Oral Oral   SpO2: 100% 98% 96%      Physical Exam:  General: adult male in bed in NAD HEENT: NCAT  Eyes: EOMI sclera anicteric Neck: supple trachea midline  Heart: S1S2 no rub Lungs: clear but reduced; normal work of breathing on room air  Abdomen: soft/obese/nt Extremities: bilateral BKA's Skin: no rash on  extremities exposed  Neuro: alert and oriented x 3 provides hx and follows commands Psych normal mood and affect GU foley in place    Medications reviewed   Labs:     Latest Ref Rng & Units 03/10/2022    9:42 AM 03/10/2022    1:21 AM 03/09/2022    7:07 PM  BMP  Glucose 70 - 99 mg/dL  142  165   BUN 6 - 20 mg/dL  88  91   Creatinine 0.61 - 1.24 mg/dL  12.88  12.50   Sodium 135 - 145 mmol/L  131  131   Potassium 3.5 - 5.1 mmol/L 2.6  2.5  2.7   Chloride 98 - 111 mmol/L  100  99   CO2 22 - 32 mmol/L  11  14   Calcium 8.9 - 10.3 mg/dL  6.2  6.2      Assessment/Plan:   # AKI  - with concern for possible progression to ESRD. No hydro  - Continue foley  - He states that he needs another day to think about what he wants to do with regard to dialysis - he  is on finasteride  - hold HCTZ and ARB/ace inhibitor - Note that He is felt to be only a candidate for in-center HD per my assessment and his primary nephrologist - he is currently living in a motel     # CKD stage IV  - Secondary to DM  - Follows with Dr. Joelyn Oms at [facility] but was lost to follow-up after last visit this summer - high risk of progression to ESRD   #  HTN  - hypotension - I have given a bolus     # BPH  - continue finasteride - continue foley - he had relief with the foley - started flomax   # Hyponatremia - Mild and setting of AKI, dehydration - improving    # Hypokalemia - delays in labs contributed to a delay in his care - I have given him LR bolus, started on a rate of LR - I ordered potassium 40 meq x 2 doses this am - potassium is pending    # Metabolic acidosis - s/p LR boluses x multiple  - follow repeat labs and anticipate need for HD  - continue LR - Defer bicarb gtt with critical hypokalemia    # Anemia CKD  - No acute indication for PRBC's or indication for ESA.  Suspect may worsen with dilution from fluids   # HIV  - has had for over 20 years per charting   Disposition - continue inpatient monitoring.  His goals of care may be changing.  Would consider palliative consult.    Claudia Desanctis, MD 03/10/2022 4:37 PM

## 2022-03-10 NOTE — Progress Notes (Signed)
Lab called this nurse. Patient had critical K 2.5 and critical calcium 6.2.  Paged triad on call physician. Will continue to monitor patient.

## 2022-03-10 NOTE — Progress Notes (Signed)
PHARMACY NOTE:  ANTIMICROBIAL RENAL DOSAGE ADJUSTMENT  Current antimicrobial regimen includes a mismatch between antimicrobial dosage and estimated renal function.  As per policy approved by the Pharmacy & Therapeutics and Medical Executive Committees, the antimicrobial dosage will be adjusted accordingly.  Current antimicrobial dosage:  Cipro 500mg  BID  Indication: GI panel positive for Salmonella and Enteropathogenic E Coli.   Renal Function:  CrCl cannot be calculated (Unknown ideal weight.). []      On intermittent HD, scheduled: []      On CRRT    Antimicrobial dosage has been changed to:  Cipro 500mg  Daily  Additional comments:   Thank you for allowing pharmacy to be a part of this patient's care.  Berta Minor, [facility] 03/10/2022 6:30 PM

## 2022-03-10 NOTE — Progress Notes (Signed)
TRH night cross cover note:   I was notified by RN of patient's potassum this AM of 2.5 (was 2.7 yesterday AM, with interval 20 meq of iv kcl administered). Slight interval increase in creatinine noted, now 12.88 (was 12.50). Also, calcium 6.2, adjusting to about 7.8 with alb of 2.1.   Per my chart review, nephrology is consulted/following, with recommendations to refrain from aggressive potassium supplementation given pt's renal dysfunction.Consequently,i ordered 20 meq of additional IV potassium chloride, added on serum magnesium level, with plan to follow for additional nephrology recommendations.    Babs Bertin, DO Hospitalist

## 2022-03-10 NOTE — Progress Notes (Signed)
Critical Result - GI panel positive for Salmonella and Enteropathogenic E Coli. As notified by lab.

## 2022-03-10 NOTE — Progress Notes (Addendum)
PROGRESS NOTE    Keith [name]  HUT:[id number] DOB: [birth year] DOA: 03/09/2022 [name], NP  Chief Complaint  Patient presents with   Weakness    Brief Narrative:   Keith [name] is Keith [name] 56 y.o. male with medical history of CAD, stage IV CKD, HIV, T2DM and multiple other medical issues presenting with generalized weakness in the setting of Keith [name] diarrheal illness.  He was admitted with aki on CKD and now has been found to have salmonella and EPEC infections.     Assessment & Plan:   Principal Problem:   AKI (acute kidney injury) ([place]) Active Problems:   Diarrhea   HTN (hypertension)   HIV disease ([place])   CKD (chronic kidney disease) stage 4, GFR 15-29 ml/min ([facility])   CAD (coronary artery disease)   BPH (benign prostatic hyperplasia)   GERD (gastroesophageal reflux disease)   Gallbladder anomaly  Acute Kidney Injury on CKD IV  AGMA  Hypokalemia  Creatinine 12.5 at presentation 12.8 today In the setting of his diarrheal illness below, also on HCTZ, ARB CT without hydro UA with 11-20 WBC, many bacteria, 0 RBC, >300 mg/dl protein Renal following, appreciate assistance -> they're discussing dialysis Foley in place Severe hypokalemia, defer replacement to renal in setting of his significant AKI Follow strict I/O, daily weights  Salmonella and EPEC Gastrointestinal Infection  Diarrhea Start cipro Will follow CD4 count as this would inform management for him with his HIV Blood cultures pending Would consult infectious disease in the AM  HIV Follow CD4 count Follow HIV PCR Holding dovato/doravirine in setting of his AKI Would consult ID tomorrow AM  Hypocalcemia Replace   Abnormal Gallbladder CT abd/pelvis with cholelithiasis with questionable gallbladder wall thickening RUQ US shows sludge, stones, and wall thickening Follow for symptoms, suspect presentation related to above  Hypertension BP meds currently on hold  Has had hypotension, getting IVF (maybe  issues with BP cuff on discussion with RN, BP's improved at this time)  BPH Finasteride, flomax  Depression  Anxiety lexapro  GERD PPI    DVT prophylaxis: SCD Code Status: full Family Communication: none Disposition:   Status is: Inpatient Remains inpatient appropriate because: continued need for inpatient management   Consultants:  renal  Procedures:  none  Antimicrobials:  Anti-infectives (From admission, onward)    Start     Dose/Rate Route Frequency Ordered Stop   03/10/22 2000  ciprofloxacin (CIPRO) tablet 500 mg        500 mg Oral 2 times daily 03/10/22 1825     03/09/22 1500  cefTRIAXone (ROCEPHIN) 1 g in sodium chloride 0.9 % 100 mL IVPB        1 g 200 mL/hr over 30 Minutes Intravenous Every 24 hours 03/09/22 1456     03/09/22 1500  metroNIDAZOLE (FLAGYL) IVPB 500 mg        500 mg 100 mL/hr over 60 Minutes Intravenous Every 12 hours 03/09/22 1456         Subjective: Denies any complaint  Objective: Vitals:   03/10/22 0447 03/10/22 0837 03/10/22 1158 03/10/22 1440  BP: 114/74 94/74 130/68 111/66  Pulse: 83 80 92   Resp: 18 18 18 18   Temp: 97.9 F (36.6 C) 99.3 F (37.4 C) (!) 97.5 F (36.4 C)   TempSrc: Oral Oral Oral   SpO2: 100% 98% 96%     Intake/Output Summary (Last 24 hours) at 03/10/2022 1826 Last data filed at 03/10/2022 0100 Gross per 24 hour  Intake 236  ml  Output --  Net 236 ml   There were no vitals filed for this visit.  Examination:  General exam: Appears calm and comfortable  Respiratory system: unlabored Cardiovascular system: RRR Gastrointestinal system: Abdomen is nondistended, soft and nontender. Central nervous system: sleepy, but awakens appropriately. No focal neurological deficits. Extremities: no edema Psychiatry: Judgement and insight appear normal. Mood & affect appropriate.     Data Reviewed: I have personally reviewed following labs and imaging studies  CBC: Recent Labs  Lab 03/09/22 0115  03/09/22 0857 03/10/22 0121  WBC 10.0 8.6 6.6  NEUTROABS  --  6.1 4.7  HGB 11.2* 10.4* 10.4*  HCT 35.4* 32.6* 32.2*  MCV 86.3 84.5 83.2  PLT 290 271 409    Basic Metabolic Panel: Recent Labs  Lab 03/09/22 0115 03/09/22 0839 03/09/22 1907 03/10/22 0121 03/10/22 0942 03/10/22 1516  NA 129*  --  131* 131*  --   --   K 3.2*  --  2.7* 2.5* 2.6* 2.7*  CL 96*  --  99 100  --   --   CO2 13*  --  14* 11*  --   --   GLUCOSE 146*  --  165* 142*  --   --   BUN 94*  --  91* 88*  --   --   CREATININE 12.59*  --  12.50* 12.88*  --   --   CALCIUM 6.9*  --  6.2* 6.2*  --   --   MG  --  2.2  --  2.0  --   --   PHOS  --   --  8.4* 8.5*  --   --     GFR: CrCl cannot be calculated (Unknown ideal weight.).  Liver Function Tests: Recent Labs  Lab 03/09/22 0115 03/09/22 1907 03/10/22 0121  AST 33  --   --   ALT 23  --   --   ALKPHOS 65  --   --   BILITOT 0.6  --   --   PROT 8.7*  --   --   ALBUMIN 2.7* 2.3* 2.1*    CBG: Recent Labs  Lab 03/09/22 1225 03/09/22 1757 03/09/22 2158 03/10/22 0833 03/10/22 1310  GLUCAP 115* 150* 177* 111* 140*     Recent Results (from the past 240 hour(s))  Culture, blood (Routine X 2) w Reflex to ID Panel     Status: None (Preliminary result)   Collection Time: 03/09/22  4:58 PM   Specimen: BLOOD  Result Value Ref Range Status   Specimen Description   Final    BLOOD RIGHT ANTECUBITAL Performed at [facility], [place] [address] [place], [place] [address]    Special Requests   Final    BOTTLES DRAWN AEROBIC AND ANAEROBIC Blood Culture adequate volume Performed at [facility] [address] [place], [place] [address]    Culture   Final    NO GROWTH < 24 HOURS Performed at [facility] [address] [place], [place] [address]    Report Status PENDING  Incomplete  Culture, blood (Routine X 2) w Reflex to ID Panel     Status: None (Preliminary result)   Collection Time: 03/09/22  5:05 PM    Specimen: BLOOD RIGHT HAND  Result Value Ref Range Status   Specimen Description   Final    BLOOD RIGHT HAND Performed at [facility], [place] [address] [place], [place] [address]  Special Requests   Final    BOTTLES DRAWN AEROBIC AND ANAEROBIC Blood Culture adequate volume Performed at [place] [facility] [address], [place], [place] [address]    Culture   Final    NO GROWTH < 24 HOURS Performed at [facility] [address], [place], [place] [address]    Report Status PENDING  Incomplete  Gastrointestinal Panel by PCR , Stool     Status: Abnormal   Collection Time: 03/09/22  6:24 PM   Specimen: Stool  Result Value Ref Range Status   Campylobacter species NOT DETECTED NOT DETECTED Final   Plesimonas shigelloides NOT DETECTED NOT DETECTED Final   Salmonella species DETECTED (Kleber Crean) NOT DETECTED Final   Yersinia enterocolitica NOT DETECTED NOT DETECTED Final    Comment: RESULT CALLED TO, READ BACK BY AND VERIFIED WITH: C/GREGORY COOPER 03/10/22 1736 AMK    Vibrio species NOT DETECTED NOT DETECTED Final   Vibrio cholerae NOT DETECTED NOT DETECTED Final   Enteroaggregative E coli (EAEC) NOT DETECTED NOT DETECTED Final   Enteropathogenic E coli (EPEC) DETECTED (Javares Kaufhold) NOT DETECTED Final    Comment: RESULT CALLED TO, READ BACK BY AND VERIFIED WITH: C/GREGORY COOPER 03/10/22 1736 AMK    Enterotoxigenic E coli (ETEC) NOT DETECTED NOT DETECTED Final   Shiga like toxin producing E coli (STEC) NOT DETECTED NOT DETECTED Final   Shigella/Enteroinvasive E coli (EIEC) NOT DETECTED NOT DETECTED Final   Cryptosporidium NOT DETECTED NOT DETECTED Final   Cyclospora cayetanensis NOT DETECTED NOT DETECTED Final   Entamoeba histolytica NOT DETECTED NOT DETECTED Final   Giardia lamblia NOT DETECTED NOT DETECTED Final   Adenovirus F40/41 NOT DETECTED NOT DETECTED Final   Astrovirus NOT DETECTED NOT DETECTED Final   Norovirus GI/GII NOT DETECTED NOT DETECTED Final   Rotavirus  Elexius Minar NOT DETECTED NOT DETECTED Final   Sapovirus (I, II, IV, and V) NOT DETECTED NOT DETECTED Final    Comment: Performed at [facility], [place] [place], [place] [address]  C Difficile Quick Screen w PCR reflex     Status: None   Collection Time: 03/10/22 12:11 PM   Specimen: STOOL  Result Value Ref Range Status   C Diff antigen NEGATIVE NEGATIVE Final   C Diff toxin NEGATIVE NEGATIVE Final   C Diff interpretation No C. difficile detected.  Final    Comment: Performed at [facility], [place] [address], [place], [place] [address]         Radiology Studies: US Abdomen Limited RUQ (LIVER/GB)  Result Date: 03/09/2022 CLINICAL DATA:  [id number] Cholelithiasis [id number] EXAM: ULTRASOUND ABDOMEN LIMITED COMPARISON:  None Available. FINDINGS: The liver demonstrates normal parenchymal echogenicity and homogeneous texture without focal hepatic parenchymal lesions or intrahepatic ductal dilatation is identified. Gallbladder wall is thickened. There is evidence of sludge. Charnay Nazario few small shadowing stones are demonstrated. No pericholecystic fluid identified. CBD 0.2 cm. Pancreas not visualized. IMPRESSION: Abnormal gallbladder with sludge, stones and wall thickening. Electronically Signed   By: Sammie Bench M.D.   On: 03/09/2022 09:40   CT ABDOMEN PELVIS WO CONTRAST  Result Date: 03/09/2022 CLINICAL DATA:  Acute kidney failure EXAM: CT ABDOMEN AND PELVIS WITHOUT CONTRAST TECHNIQUE: Multidetector CT imaging of the abdomen and pelvis was performed following the standard protocol without IV contrast. RADIATION DOSE REDUCTION: This exam was performed according to the departmental dose-optimization program which includes automated exposure control, adjustment of the mA and/or kV according to patient size and/or use of iterative reconstruction  technique. COMPARISON:  Prior CT scan of the abdomen and pelvis 12/18/2021 FINDINGS: Lower chest: No acute abnormality. Similar appearance of small  pulmonary nodules and tree-in-bud micro nodularity in the periphery of the right lower lobe. Borderline cardiomegaly. Hepatobiliary: Cholelithiasis. There is some suggestion of submucosal edema involving the gallbladder, however the area is marred by mild motion artifact and beam hardening artifact. Normal hepatic contour and morphology. No biliary ductal dilatation. Pancreas: Unremarkable. No pancreatic ductal dilatation or surrounding inflammatory changes. Spleen: Normal in size without focal abnormality. Adrenals/Urinary Tract: Adrenal glands are unremarkable. Kidneys are normal, without renal calculi, focal lesion, or hydronephrosis. Foley catheter present within the bladder. Stomach/Bowel: No focal bowel wall thickening or evidence of obstruction. Vascular/Lymphatic: Limited evaluation in the absence of intravenous contrast. No aneurysm. No suspicious lymphadenopathy. Reproductive: Prostate is unremarkable. Other: No abdominal wall hernia or abnormality. No abdominopelvic ascites. Musculoskeletal: No acute fracture or aggressive appearing lytic or blastic osseous lesion. IMPRESSION: 1. Cholelithiasis with questionable gallbladder wall thickening in [place] region of artifact. Acute cholecystitis is Keric Zehren consideration. Consider further evaluation with right upper quadrant abdominal ultrasound. 2. Otherwise, no acute abnormality within the abdomen or pelvis. 3. Additional ancillary findings as above. Electronically Signed   By: Jacqulynn Cadet M.D.   On: 03/09/2022 08:07   DG Chest Port 1 View  Result Date: 03/09/2022 CLINICAL DATA:  [place] EXAM: PORTABLE CHEST 1 VIEW COMPARISON:  12/29/2021 FINDINGS: Heart and mediastinal contours are within normal limits. No focal opacities or effusions. No acute bony abnormality. IMPRESSION: No active disease. Electronically Signed   By: Rolm Baptise M.D.   On: 03/09/2022 02:05        Scheduled Meds:  aspirin EC  81 mg Oral Daily   ciprofloxacin  500 mg Oral BID    clopidogrel  75 mg Oral Daily   escitalopram  20 mg Oral Daily   finasteride  5 mg Oral Daily   insulin aspart  0-9 Units Subcutaneous TID WC   potassium chloride  40 mEq Oral Q4H   potassium chloride  40 mEq Oral Q4H   pregabalin  50 mg Oral BID   tamsulosin  0.4 mg Oral QPC supper   Continuous Infusions:  calcium gluconate     cefTRIAXone (ROCEPHIN)  IV 1 g (03/10/22 0954)   lactated ringers     lactated ringers 75 mL/hr at 03/10/22 1422   metronidazole 500 mg (03/10/22 0340)     LOS: 1 day    Time spent: over 30 min    Fayrene Helper, MD [facility]   To contact the attending provider between 7A-7P or the covering provider during after hours 7P-7A, please log into the web site [email or web address] and access using universal [place] password for that web site. If you do not have the password, please call the hospital operator.  03/10/2022, 6:26 PM

## 2022-03-11 ENCOUNTER — Inpatient Hospital Stay (HOSPITAL_COMMUNITY): Payer: Medicare Other

## 2022-03-11 DIAGNOSIS — R197 Diarrhea, unspecified: Secondary | ICD-10-CM | POA: Diagnosis not present

## 2022-03-11 DIAGNOSIS — B20 Human immunodeficiency virus [HIV] disease: Secondary | ICD-10-CM | POA: Diagnosis not present

## 2022-03-11 DIAGNOSIS — N179 Acute kidney failure, unspecified: Secondary | ICD-10-CM | POA: Diagnosis not present

## 2022-03-11 DIAGNOSIS — N186 End stage renal disease: Secondary | ICD-10-CM | POA: Diagnosis not present

## 2022-03-11 DIAGNOSIS — I10 Essential (primary) hypertension: Secondary | ICD-10-CM

## 2022-03-11 DIAGNOSIS — Z515 Encounter for palliative care: Secondary | ICD-10-CM

## 2022-03-11 DIAGNOSIS — N189 Chronic kidney disease, unspecified: Secondary | ICD-10-CM

## 2022-03-11 LAB — CBC WITH DIFFERENTIAL/PLATELET
Abs Immature Granulocytes: 0.05 10*3/uL (ref 0.00–0.07)
Basophils Absolute: 0 10*3/uL (ref 0.0–0.1)
Basophils Relative: 0 %
Eosinophils Absolute: 0.1 10*3/uL (ref 0.0–0.5)
Eosinophils Relative: 1 %
HCT: 29.8 % — ABNORMAL LOW (ref 39.0–52.0)
Hemoglobin: 9.6 g/dL — ABNORMAL LOW (ref 13.0–17.0)
Immature Granulocytes: 1 %
Lymphocytes Relative: 23 %
Lymphs Abs: 1.7 10*3/uL (ref 0.7–4.0)
MCH: 26.9 pg (ref 26.0–34.0)
MCHC: 32.2 g/dL (ref 30.0–36.0)
MCV: 83.5 fL (ref 80.0–100.0)
Monocytes Absolute: 0.7 10*3/uL (ref 0.1–1.0)
Monocytes Relative: 10 %
Neutro Abs: 4.7 10*3/uL (ref 1.7–7.7)
Neutrophils Relative %: 65 %
Platelets: 273 10*3/uL (ref 150–400)
RBC: 3.57 MIL/uL — ABNORMAL LOW (ref 4.22–5.81)
RDW: 12.8 % (ref 11.5–15.5)
WBC: 7.2 10*3/uL (ref 4.0–10.5)
nRBC: 0 % (ref 0.0–0.2)

## 2022-03-11 LAB — RENAL FUNCTION PANEL
Albumin: 2.1 g/dL — ABNORMAL LOW (ref 3.5–5.0)
Anion gap: 18 — ABNORMAL HIGH (ref 5–15)
BUN: 89 mg/dL — ABNORMAL HIGH (ref 6–20)
CO2: 11 mmol/L — ABNORMAL LOW (ref 22–32)
Calcium: 5.9 mg/dL — CL (ref 8.9–10.3)
Chloride: 103 mmol/L (ref 98–111)
Creatinine, Ser: 13.34 mg/dL — ABNORMAL HIGH (ref 0.61–1.24)
GFR, Estimated: 4 mL/min — ABNORMAL LOW
Glucose, Bld: 114 mg/dL — ABNORMAL HIGH (ref 70–99)
Phosphorus: 8.7 mg/dL — ABNORMAL HIGH (ref 2.5–4.6)
Potassium: 3.7 mmol/L (ref 3.5–5.1)
Sodium: 132 mmol/L — ABNORMAL LOW (ref 135–145)

## 2022-03-11 LAB — GASTROINTESTINAL PANEL BY PCR, STOOL (REPLACES STOOL CULTURE)
Adenovirus F40/41: NOT DETECTED
Astrovirus: NOT DETECTED
Campylobacter species: NOT DETECTED
Cryptosporidium: NOT DETECTED
Cyclospora cayetanensis: NOT DETECTED
Entamoeba histolytica: NOT DETECTED
Enteroaggregative E coli (EAEC): NOT DETECTED
Enteropathogenic E coli (EPEC): DETECTED — AB
Enterotoxigenic E coli (ETEC): NOT DETECTED
Giardia lamblia: NOT DETECTED
Norovirus GI/GII: NOT DETECTED
Plesimonas shigelloides: NOT DETECTED
Rotavirus A: NOT DETECTED
Salmonella species: DETECTED — AB
Sapovirus (I, II, IV, and V): NOT DETECTED
Shiga like toxin producing E coli (STEC): NOT DETECTED
Shigella/Enteroinvasive E coli (EIEC): NOT DETECTED
Vibrio cholerae: NOT DETECTED
Vibrio species: NOT DETECTED
Yersinia enterocolitica: NOT DETECTED

## 2022-03-11 LAB — GLUCOSE, CAPILLARY
Glucose-Capillary: 121 mg/dL — ABNORMAL HIGH (ref 70–99)
Glucose-Capillary: 114 mg/dL — ABNORMAL HIGH (ref 70–99)
Glucose-Capillary: 122 mg/dL — ABNORMAL HIGH (ref 70–99)
Glucose-Capillary: 124 mg/dL — ABNORMAL HIGH (ref 70–99)

## 2022-03-11 LAB — CD4/CD8 (T-HELPER/T-SUPPRESSOR CELL)
CD4 absolute: 89 /uL — ABNORMAL LOW (ref 400–1790)
CD4%: 5.39 % — ABNORMAL LOW (ref 33–65)
CD8 T Cell Abs: 1075 /uL — ABNORMAL HIGH (ref 190–1000)
CD8tox: 65.44 % — ABNORMAL HIGH (ref 12–40)
Ratio: 0.08 — ABNORMAL LOW (ref 1.0–3.0)
Total lymphocyte count: 1643 /uL (ref 1000–4000)

## 2022-03-11 LAB — HEPATITIS B SURFACE ANTIGEN: Hepatitis B Surface Ag: NONREACTIVE

## 2022-03-11 MED ORDER — CHLORHEXIDINE GLUCONATE CLOTH 2 % EX PADS
6.0000 | MEDICATED_PAD | Freq: Every day | CUTANEOUS | Status: DC
  Administered 2022-03-12 – 2022-03-13 (×2): 6 via TOPICAL

## 2022-03-11 MED ORDER — DARBEPOETIN ALFA 60 MCG/0.3ML IJ SOSY
60.0000 ug | PREFILLED_SYRINGE | INTRAMUSCULAR | Status: DC
  Administered 2022-03-11: 60 ug via SUBCUTANEOUS
  Filled 2022-03-11 (×4): qty 0.3

## 2022-03-11 MED ORDER — CHLORHEXIDINE GLUCONATE CLOTH 2 % EX PADS
6.0000 | MEDICATED_PAD | Freq: Every day | CUTANEOUS | Status: DC
  Administered 2022-03-10 – 2022-03-15 (×5): 6 via TOPICAL

## 2022-03-11 MED ORDER — SODIUM CHLORIDE 0.9 % IV SOLN
1.0000 g | Freq: Once | INTRAVENOUS | Status: AC
  Administered 2022-03-11: 1 g via INTRAVENOUS
  Filled 2022-03-11: qty 10

## 2022-03-11 NOTE — Progress Notes (Signed)
[facility] Progress Note   Subjective:  Strict ins/outs are not available.  Potassium is better, diarrhea is better.   he was able to eat breakfast this AM .  He says he is ready to start dialysis " I dont have a choice"    56 y.o. male with a history of HIV, CAD with hx STEMI, CKD stage IV, DM type 2, HTN, and prior CVA who presented to the hospital with generalized weakness and watery, non-bloody diarrhea.  He was found to have creatinine markedly elevated at 12.59.   baseline Cr 3.5 -4.0.  past meds include losartan and HCTZ.  He was seen by [facility] inpatient in July for AKI felt 2/2 ATN from sustained pre-renal insults.  He was last seen in hospital follow-up in July by Dr. Joelyn Oms.  He was assessed as having CKD stage IV and a high risk of progression to ESRD; he was felt to only be a candidate for in-center hemo when dialysis was needed.  He was to follow-up in one month then was lost to follow-up.  He does not have dialysis access. He lives in a motel.  Would want dialysis if needed but hopes it hasn't come to that.  We discussed things didn't look good.  He did have some relief from the foley he states.  Strict ins/outs are not available.     Intake/Output Summary (Last 24 hours) at 03/11/2022 1141 Last data filed at 03/11/2022 1100 Gross per 24 hour  Intake 1938.02 ml  Output 600 ml  Net 1338.02 ml    Vitals:  Vitals:   03/10/22 1158 03/10/22 1440 03/10/22 2027 03/11/22 0528  BP: 130/68 111/66 97/72 122/78  Pulse: 92  70 73  Resp: 18 18 18 18   Temp: (!) 97.5 F (36.4 C)  97.9 F (36.6 C) 97.9 F (36.6 C)  TempSrc: Oral  Oral Oral  SpO2: 96%  96% 98%     Physical Exam:  General: adult male in bed in NAD HEENT: NCAT  Eyes: EOMI sclera anicteric Neck: supple trachea midline  Heart: S1S2 no rub Lungs: clear but reduced; normal work of breathing on room air  Abdomen: soft/obese/nt Extremities: bilateral BKA's Skin: no rash on extremities exposed  Neuro: alert  and oriented x 3 provides hx and follows commands Psych normal mood and affect GU foley in place    Medications reviewed   Labs:     Latest Ref Rng & Units 03/11/2022    3:08 AM 03/10/2022    3:16 PM 03/10/2022    9:42 AM  BMP  Glucose 70 - 99 mg/dL 114     BUN 6 - 20 mg/dL 89     Creatinine 0.61 - 1.24 mg/dL 13.34     Sodium 135 - 145 mmol/L 132     Potassium 3.5 - 5.1 mmol/L 3.7  2.7  C 2.6  C  Chloride 98 - 111 mmol/L 103     CO2 22 - 32 mmol/L 11     Calcium 8.9 - 10.3 mg/dL 5.9       C Corrected result     Assessment/Plan:   # AKI  - with concern for possible progression to ESRD. No hydro  - Continue foley  - He states now that he is agreeable to start HD-  will order [facility] thru IR and also get [facility] involved-  CLIP  - Note that He is felt to be only a candidate for in-center HD per my assessment and his  primary nephrologist - he is currently living in a motel     # CKD stage IV  - Secondary to DM  - Follows with [facility]. Joelyn [facility]ms at [facility] but was lost to follow-up after last visit this summer - high risk of progression to ESRD-  we think he is there-  labs if anything have gotten worse since admit   #  HTN  Controlled-  no meds-  does not seem volume overloaded    # BPH  - continue finasteride - continue foley - he had relief with the foley - started flomax   # Hyponatremia - Mild and setting of AKI, dehydration - improving    # Hypokalemia - delays in labs contributed to a delay in his care - I have given him LR bolus, started on a rate of LR - I ordered potassium 40 meq x 2 doses this am - potassium is better-  high K bath    # Metabolic acidosis - s/p LR boluses x multiple  - follow repeat labs and anticipate need for HD    # Anemia CKD  - No acute indication for PRBC's -  now in the 9's-  will check iron and give ESA as well    # HIV  - has had for over 20 years per charting    Louis Meckel, MD 03/11/2022 11:41 AM

## 2022-03-11 NOTE — Progress Notes (Signed)
TRH night cross cover note:   I was notified by RN of critical serum calcium level this AM of 5.9, adjusted to 7.6 for hypoalbuminemia (alb 2.1). This was reported to have been drawn at 3 AM, shortly have receipt of calcium supplementation around 145 AM. Nephrology consulted and following this patient, here with aki on ckd 4. Will defer additional calcium supplementation to the recs of nephrology.     Babs Bertin, DO Hospitalist

## 2022-03-11 NOTE — Progress Notes (Signed)
PROGRESS NOTE    Keith [name]  IWP:[id number] DOB: 03/27/1966 DOA: 03/09/2022 [name], NP    Brief Narrative:  56 year old with history of coronary artery disease, CKD stage IV, HIV and type 2 diabetes on insulin along with multiple medical issues including both leg amputation presented with generalized weakness and diarrhea.  Found to have acute kidney injury, Salmonella and EPEC infections.  Admitted with nephrology consultation.   Assessment & Plan:   Acute kidney injury in a patient with CKD stage IV: Recent known creatinine of 3.7-3.8. Presented with acute kidney injury likely secondary to diarrheal illness, on hydrochlorothiazide and ARB. Nephrology following.  Foley catheter was placed.  Looks like he made about 600 mL of urine over last 24 hours.  Given his severe electrolyte abnormalities, anion gap metabolic acidosis, is likely heading towards hemodialysis.  Currently on maintenance Ringer lactate. Continue Foley today.  Nephrology to decide about dialysis plans.  Acute gastroenteritis with Salmonella and EPEC: Ciprofloxacin, renally dosed for 5 days as he is immunocompromised with CD4 count 129, HIV less than 20.  HIV disease: Patient on Dovato/ Doravirine that is on hold in the setting of acute kidney injury. ID consulted given HIV and Salmonella.  Hypokalemia: Adequately replaced.  Hypocalcemia: We will defer to nephrology service for replacement.  Hypertension: Blood pressures low normal.  On maintenance IV fluids.  Antihypertensives on hold.  Depression and anxiety: On reduced dose of Lyrica, Lexapro continued.  Type 2 diabetes: On insulin at home.  Poor oral intake.  Currently on sliding scale insulin.  BPH: On finasteride and Flomax.  DVT prophylaxis: SCDs Start: 03/09/22 0845   Code Status: Full code. will consult pulm palliative care medicine given significant illness and now needing dialysis. Family Communication: None at the  bedside Disposition Plan: Status is: Inpatient Remains inpatient appropriate because: Significant abnormal kidney function test.     Consultants:  Nephrology Palliative care  Procedures:  None  Antimicrobials:  Ciprofloxacin 500 mg p.o. daily 12/3---   Subjective: Patient was seen and examined.  Mostly sleepy..  Does not engage in conversation.  He tells me he is fine.  He tells me his diarrhea has improved and denies any nausea vomiting.  Still has loose watery stool on his fecal collecting system.  Remains afebrile.  Foley catheter has about 500 mill urine.  Objective: Vitals:   03/10/22 1158 03/10/22 1440 03/10/22 2027 03/11/22 0528  BP: 130/68 111/66 97/72 122/78  Pulse: 92  70 73  Resp: 18 18 18 18   Temp: (!) 97.5 F (36.4 C)  97.9 F (36.6 C) 97.9 F (36.6 C)  TempSrc: Oral  Oral Oral  SpO2: 96%  96% 98%    Intake/Output Summary (Last 24 hours) at 03/11/2022 1137 Last data filed at 03/11/2022 1100 Gross per 24 hour  Intake 1938.02 ml  Output 600 ml  Net 1338.02 ml   There were no vitals filed for this visit.  Examination:  General exam: Chronically sick looking.  Flat affect.  Uninterested on conversation. Respiratory system: Clear to auscultation. Respiratory effort normal. Cardiovascular system: S1 & S2 heard, RRR. No pedal edema.  Right below-knee amputation.  Left above-knee amputation.  Stumps are clear. Gastrointestinal system: Abdomen is nondistended, soft and nontender. No organomegaly or masses felt. Normal bowel sounds heard.  Obese and pendulous. Central nervous system: Alert and oriented. No focal neurological deficits.  Generalized weakness.  Lethargic.    Data Reviewed: I have personally reviewed following labs and imaging studies  CBC: Recent Labs  Lab 03/09/22 0115 03/09/22 0857 03/10/22 0121 03/11/22 0308  WBC 10.0 8.6 6.6 7.2  NEUTROABS  --  6.1 4.7 4.7  HGB 11.2* 10.4* 10.4* 9.6*  HCT 35.4* 32.6* 32.2* 29.8*  MCV 86.3 84.5 83.2  83.5  PLT 290 271 283 712   Basic Metabolic Panel: Recent Labs  Lab 03/09/22 0115 03/09/22 0839 03/09/22 1907 03/10/22 0121 03/10/22 0942 03/10/22 1516 03/11/22 0308  NA 129*  --  131* 131*  --   --  132*  K 3.2*  --  2.7* 2.5* 2.6* 2.7* 3.7  CL 96*  --  99 100  --   --  103  CO2 13*  --  14* 11*  --   --  11*  GLUCOSE 146*  --  165* 142*  --   --  114*  BUN 94*  --  91* 88*  --   --  89*  CREATININE 12.59*  --  12.50* 12.88*  --   --  13.34*  CALCIUM 6.9*  --  6.2* 6.2*  --   --  5.9*  MG  --  2.2  --  2.0  --   --   --   PHOS  --   --  8.4* 8.5*  --   --  8.7*   GFR: CrCl cannot be calculated (Unknown ideal weight.). Liver Function Tests: Recent Labs  Lab 03/09/22 0115 03/09/22 1907 03/10/22 0121 03/11/22 0308  AST 33  --   --   --   ALT 23  --   --   --   ALKPHOS 65  --   --   --   BILITOT 0.6  --   --   --   PROT 8.7*  --   --   --   ALBUMIN 2.7* 2.3* 2.1* 2.1*   Recent Labs  Lab 03/09/22 0115  LIPASE 44   No results for input(s): "AMMONIA" in the last 168 hours. Coagulation Profile: No results for input(s): "INR", "PROTIME" in the last 168 hours. Cardiac Enzymes: No results for input(s): "CKTOTAL", "CKMB", "CKMBINDEX", "TROPONINI" in the last 168 hours. BNP (last 3 results) No results for input(s): "PROBNP" in the last 8760 hours. HbA1C: No results for input(s): "HGBA1C" in the last 72 hours. CBG: Recent Labs  Lab 03/09/22 2158 03/10/22 0833 03/10/22 1310 03/10/22 2010 03/11/22 0751  GLUCAP 177* 111* 140* 142* 121*   Lipid Profile: No results for input(s): "CHOL", "HDL", "LDLCALC", "TRIG", "CHOLHDL", "LDLDIRECT" in the last 72 hours. Thyroid Function Tests: No results for input(s): "TSH", "T4TOTAL", "FREET4", "T3FREE", "THYROIDAB" in the last 72 hours. Anemia Panel: No results for input(s): "VITAMINB12", "FOLATE", "FERRITIN", "TIBC", "IRON", "RETICCTPCT" in the last 72 hours. Sepsis Labs: No results for input(s): "PROCALCITON",  "LATICACIDVEN" in the last 168 hours.  Recent Results (from the past 240 hour(s))  Culture, blood (Routine X 2) w Reflex to ID Panel     Status: None (Preliminary result)   Collection Time: 03/09/22  4:58 PM   Specimen: BLOOD  Result Value Ref Range Status   Specimen Description   Final    BLOOD RIGHT ANTECUBITAL Performed at [facility] [address] [place], [place] [address]    Special Requests   Final    BOTTLES DRAWN AEROBIC AND ANAEROBIC Blood Culture adequate volume Performed at [facility] [address], [place], [place] [address]    Culture   Final    NO GROWTH <  24 HOURS Performed at [facility], [place] [address] [place], [place] [address]    Report Status PENDING  Incomplete  Culture, blood (Routine X 2) w Reflex to ID Panel     Status: None (Preliminary result)   Collection Time: 03/09/22  5:05 PM   Specimen: BLOOD RIGHT HAND  Result Value Ref Range Status   Specimen Description   Final    BLOOD RIGHT HAND Performed at [facility], [place] [address] [place], [place] [address]    Special Requests   Final    BOTTLES DRAWN AEROBIC AND ANAEROBIC Blood Culture adequate volume Performed at [facility] [address], [place], [place] [address]    Culture   Final    NO GROWTH < 24 HOURS Performed at [facility] [address], [place], [place] [address]    Report Status PENDING  Incomplete  Gastrointestinal Panel by PCR , Stool     Status: Abnormal   Collection Time: 03/09/22  6:24 PM   Specimen: Stool  Result Value Ref Range Status   Campylobacter species NOT DETECTED NOT DETECTED Final   Plesimonas shigelloides NOT DETECTED NOT DETECTED Final   Salmonella species DETECTED (A) NOT DETECTED Final    Comment: RESULT CALLED TO, READ BACK BY AND VERIFIED WITH: C/GREGORY COOPER 03/10/22 1736 AMK    Yersinia enterocolitica NOT DETECTED NOT DETECTED Corrected    Comment: CORRECTED ON  12/04 AT 0912: PREVIOUSLY REPORTED AS NOT DETECTED RESULT CALLED TO, READ BACK BY AND VERIFIED WITH: C/GREGORY COOPER 03/10/22 1736 AMK   Vibrio species NOT DETECTED NOT DETECTED Final   Vibrio cholerae NOT DETECTED NOT DETECTED Final   Enteroaggregative E coli (EAEC) NOT DETECTED NOT DETECTED Final   Enteropathogenic E coli (EPEC) DETECTED (A) NOT DETECTED Final    Comment: RESULT CALLED TO, READ BACK BY AND VERIFIED WITH: C/GREGORY COOPER 03/10/22 1736 AMK    Enterotoxigenic E coli (ETEC) NOT DETECTED NOT DETECTED Final   Shiga like toxin producing E coli (STEC) NOT DETECTED NOT DETECTED Final   Shigella/Enteroinvasive E coli (EIEC) NOT DETECTED NOT DETECTED Final   Cryptosporidium NOT DETECTED NOT DETECTED Final   Cyclospora cayetanensis NOT DETECTED NOT DETECTED Final   Entamoeba histolytica NOT DETECTED NOT DETECTED Final   Giardia lamblia NOT DETECTED NOT DETECTED Final   Adenovirus F40/41 NOT DETECTED NOT DETECTED Final   Astrovirus NOT DETECTED NOT DETECTED Final   Norovirus GI/GII NOT DETECTED NOT DETECTED Final   Rotavirus A NOT DETECTED NOT DETECTED Final   Sapovirus (I, II, IV, and V) NOT DETECTED NOT DETECTED Final    Comment: Performed at [facility], [place], [place] [address]  C Difficile Quick Screen w PCR reflex     Status: None   Collection Time: 03/10/22 12:11 PM   Specimen: STOOL  Result Value Ref Range Status   C Diff antigen NEGATIVE NEGATIVE Final   C Diff toxin NEGATIVE NEGATIVE Final   C Diff interpretation No C. difficile detected.  Final    Comment: Performed at [facility], [place] [address], [place],  [address]         Radiology Studies: No results found.      Scheduled Meds:  aspirin EC  81 mg Oral Daily   Chlorhexidine Gluconate Cloth  6 each Topical Daily   ciprofloxacin  500 mg Oral Q2000   clopidogrel  75 mg Oral Daily   escitalopram  20  mg Oral Daily   finasteride  5 mg Oral Daily   insulin aspart   0-9 Units Subcutaneous TID WC   pregabalin  50 mg Oral BID   tamsulosin  0.4 mg Oral QPC supper   Continuous Infusions:   LOS: 2 days    Time spent: 35 minutes    Barb Merino, MD [facility] Pager [phone]

## 2022-03-11 NOTE — Consult Note (Signed)
Consultation Note Date: 03/11/2022 at 1500  Patient Name: Keith [name]  DOB: [birth year]  MRN: [id number]  Age / Sex: 56 y.o., male  [name], NP Referring Physician: Barb Merino, MD  Reason for Consultation: Establishing goals of care  HPI/Patient Profile: 56 y.o. male  with past medical history of CAD, CKD (stage 4), HIV (Dovato and Pifeltro - intermittent compliance), DM2, bialteral leg amputation, BPH, and GERD admitted on 03/09/2022 with diarrhea.  Patient is being treated for AKI r/t diarrheal illness from salmonella and epec. Diarrhea has improved with treatment of ceftriaxone and cipro. Patient remains in AKI (Cr 13.34 as of 12/4) and has agreed to proceed with HD.   PMT was consulted in light of patient's significant illness and new need for dialysis.  Clinical Assessment and Goals of Care: I have reviewed medical records including EPIC notes, labs and imaging, assessed the patient and then met with patient at bedside to discuss diagnosis prognosis, GOC, EOL wishes, disposition and options.   I introduced Palliative Medicine as specialized medical care for people living with serious illness. It focuses on providing relief from the symptoms and stress of a serious illness. The goal is to improve quality of life for both the patient and the family.  We discussed patient's diarrhea, AKI, and what it means in the larger context of patient's on-going co-morbidities.  As far as symptom burden, patient endorses his diarrthea has resolved and denies abdominal pain/discomfort, N/V/D, or other significant physical complaints.   I attempted to elicit values and goals of care important to the patient. Patient was very sleepy but easily arousable. We briefly discussed HD. Patient states he is in agreement to proceed with HD.   He was able to appropriately answer orientation questions but requested  that I return tomorrow to further discuss [place] when his mother is present. He shares she plans to visit about 1pm.     Discussed with patient the importance of continued conversation with family and the medical providers regarding overall plan of care and treatment options, ensuring decisions are within the context of the patient's values and GOCs.  I attempted to speak with patient's mother. No answer. HIPAA compliant VM left.   I plan to be bedside tomorrow at 1pm in hopes of meeting with not only patient but his mother as well to continue [place] discussions.   Primary Decision Maker PATIENT  Physical Exam Vitals reviewed.  Constitutional:      General: He is not in acute distress.    Appearance: He is obese. He is not ill-appearing.  HENT:     Head: Normocephalic.     Nose: Nose normal.     Mouth/Throat:     Mouth: Mucous membranes are moist.  Eyes:     Pupils: Pupils are equal, round, and reactive to light.  Cardiovascular:     Rate and Rhythm: Normal rate.     Pulses: Normal pulses.  Pulmonary:     Effort: Pulmonary effort is normal.  Abdominal:  Palpations: Abdomen is soft.  Musculoskeletal:     Comments: Moves upper extremities to command  Skin:    General: Skin is warm and dry.  Neurological:     Mental Status: He is alert and oriented to person, place, and time.  Psychiatric:        Mood and Affect: Mood normal.        Behavior: Behavior normal.        Thought Content: Thought content normal.        Judgment: Judgment normal.     Palliative Assessment/Data: 40-50%     Thank you for this consult. Palliative medicine will continue to follow and assist holistically.   Time Total: 40 minutes Greater than 50%  of this time was spent counseling and coordinating care related to the above assessment and plan.  Signed by: Jordan Hawks, DNP, FNP-BC Palliative Medicine    Please contact Palliative Medicine Team phone at [phone] for questions and concerns.   For individual provider: See Shea Evans

## 2022-03-11 NOTE — Consult Note (Signed)
[facility] for Infectious Disease       Reason for Consult: Salmonella positive GI panel    Referring Physician: Dr. Sloan Leiter  Principal Problem:   AKI (acute kidney injury) [facility]) Active Problems:   HTN (hypertension)   HIV disease ([place])   Diarrhea   CKD (chronic kidney disease) stage 4, GFR 15-29 ml/min (HCC)   CAD (coronary artery disease)   BPH (benign prostatic hyperplasia)   GERD (gastroesophageal reflux disease)   Gallbladder anomaly    aspirin EC  81 mg Oral Daily   Chlorhexidine Gluconate Cloth  6 each Topical Daily   clopidogrel  75 mg Oral Daily   darbepoetin (ARANESP) injection - NON-DIALYSIS  60 mcg Subcutaneous Q Mon-1800   escitalopram  20 mg Oral Daily   finasteride  5 mg Oral Daily   insulin aspart  0-9 Units Subcutaneous TID WC   pregabalin  50 mg Oral BID   tamsulosin  0.4 mg Oral QPC supper    Recommendations: Treatment for 3 days with ceftriaxone today to complete treatment for Salmonella  Assessment: He has diarrhea now improved with treatment with antibiotics for Salmonella.  Also a previous E coli EPEC that is persistent.  I do not think the E coli needs treatment though will be treated as above regardless.   Looks to be needing to start dialysis.    I have scheduled him with a new provider at [facility] since his previous provider has left  Antibiotics: Ceftriaxone, cipro  HPI: Keith [name] is a 56 y.o. male with a history of HIV followed in our clinic on [place] with intermittent compliance issues here with diarrhea and found to have Salmonella positive GI panel.  Has been well suppressed this year.  He is s/p AKA on left, right BKA, CAD and chronic kidney disease now with worsening renal failure with significant diarrhea illness, now improving.     Review of Systems:  Constitutional: negative for fevers and chills All other systems reviewed and are negative    Past Medical History:  Diagnosis Date   Abscess of right foot     abscess/ulcer of R transtibial amputation requiring IV abx   Acute ST elevation myocardial infarction (STEMI) due to occlusion of circumflex coronary artery ([place]) 03/22/2020   AIDS ([place])    Anemia    CAD (coronary artery disease)    a. MI with stenting of OM1 in 11/2018 with residual disease. b. acute STEMI 03/2020 s/p DES to OM1   Chronic anemia    Chronic knee pain    right   Chronic pain    CKD (chronic kidney disease), stage IV (HCC)    CVA (cerebral vascular accident) ([place]) 04/2020   Diabetes type 2, uncontrolled    HgA1c 17.6 (04/27/2010)   Diabetic foot ulcer ([place]) 01/2017   right foot   Dilatation of aorta (HCC)    Erectile dysfunction    Genital warts    GERD (gastroesophageal reflux disease)    History of blood transfusion    HIV (human immunodeficiency virus infection) ([place]) 2009   CD4 count 100, VL 13800 (05/01/2010)   Hyperlipidemia    Hypertension    Myocardial infarction [facility])    Neuropathy    Noncompliance with medication regimen    Osteomyelitis ([place])    h/o hand   Osteomyelitis of metatarsal ([place]) 04/28/2017   Pneumonia    STEMI (ST elevation myocardial infarction) ([place]) 11/10/2018    Social History   Tobacco Use  Smoking status: Never   Smokeless tobacco: Never  Vaping Use   Vaping Use: Never used  Substance Use Topics   Alcohol use: Not Currently   Drug use: Never    Comment: OTC ASA    Family History  Problem Relation Age of Onset   Hypertension Mother    Arthritis Father    Hypertension Father    Hypertension Brother    Cancer Maternal Grandmother 81       unknown type of cancer   Depression Paternal Grandmother     Allergies  Allergen Reactions   Elemental Sulfur Itching    Patient stated he's allergic to "sulfur" AND "sulfa"   Metformin And Related Other (See Comments)    Stopped by MD- affected kidneys   Zetia [Ezetimibe] Nausea Only   Sulfa Antibiotics Itching    Physical Exam: Constitutional: in no apparent distress   Vitals:   03/10/22 2027 03/11/22 0528  BP: 97/72 122/78  Pulse: 70 73  Resp: 18 18  Temp: 97.9 F (36.6 C) 97.9 F (36.6 C)  SpO2: 96% 98%   EYES: anicteric ENMT: no thrush Respiratory: normal respiratory effort GI: soft Musculoskeletal: biltateral amputations  Lab Results  Component Value Date   WBC 7.2 03/11/2022   HGB 9.6 (L) 03/11/2022   HCT 29.8 (L) 03/11/2022   MCV 83.5 03/11/2022   PLT 273 03/11/2022    Lab Results  Component Value Date   CREATININE 13.34 (H) 03/11/2022   BUN 89 (H) 03/11/2022   NA 132 (L) 03/11/2022   K 3.7 03/11/2022   CL 103 03/11/2022   CO2 11 (L) 03/11/2022    Lab Results  Component Value Date   ALT 23 03/09/2022   AST 33 03/09/2022   ALKPHOS 65 03/09/2022     Microbiology: Recent Results (from the past 240 hour(s))  Culture, blood (Routine X 2) w Reflex to ID Panel     Status: None (Preliminary result)   Collection Time: 03/09/22  4:58 PM   Specimen: BLOOD  Result Value Ref Range Status   Specimen Description   Final    BLOOD RIGHT ANTECUBITAL Performed at [facility], [place] [address] [place], [place] [address]    Special Requests   Final    BOTTLES DRAWN AEROBIC AND ANAEROBIC Blood Culture adequate volume Performed at [facility] [address] [place], [place] [address]    Culture   Final    NO GROWTH 2 DAYS Performed at [facility] [address], [place], [place] [address]    Report Status PENDING  Incomplete  Culture, blood (Routine X 2) w Reflex to ID Panel     Status: None (Preliminary result)   Collection Time: 03/09/22  5:05 PM   Specimen: BLOOD RIGHT HAND  Result Value Ref Range Status   Specimen Description   Final    BLOOD RIGHT HAND Performed at [facility], [place] [address], [place], [place] [address]    Special Requests   Final    BOTTLES DRAWN AEROBIC AND ANAEROBIC Blood Culture adequate volume Performed at [facility]  [address] [place], [place] [address]    Culture   Final    NO GROWTH 2 DAYS Performed at [facility] [address], [place], [place] [address]    Report Status PENDING  Incomplete  Gastrointestinal Panel by PCR , Stool     Status: Abnormal   Collection Time: 03/09/22  6:24  PM   Specimen: Stool  Result Value Ref Range Status   Campylobacter species NOT DETECTED NOT DETECTED Final   Plesimonas shigelloides NOT DETECTED NOT DETECTED Final   Salmonella species DETECTED (A) NOT DETECTED Final    Comment: RESULT CALLED TO, READ BACK BY AND VERIFIED WITH: C/GREGORY COOPER 03/10/22 1736 AMK    Yersinia enterocolitica NOT DETECTED NOT DETECTED Corrected    Comment: CORRECTED ON 12/04 AT 0912: PREVIOUSLY REPORTED AS NOT DETECTED RESULT CALLED TO, READ BACK BY AND VERIFIED WITH: C/GREGORY COOPER 03/10/22 1736 AMK   Vibrio species NOT DETECTED NOT DETECTED Final   Vibrio cholerae NOT DETECTED NOT DETECTED Final   Enteroaggregative E coli (EAEC) NOT DETECTED NOT DETECTED Final   Enteropathogenic E coli (EPEC) DETECTED (A) NOT DETECTED Final    Comment: RESULT CALLED TO, READ BACK BY AND VERIFIED WITH: C/GREGORY COOPER 03/10/22 1736 AMK    Enterotoxigenic E coli (ETEC) NOT DETECTED NOT DETECTED Final   Shiga like toxin producing E coli (STEC) NOT DETECTED NOT DETECTED Final   Shigella/Enteroinvasive E coli (EIEC) NOT DETECTED NOT DETECTED Final   Cryptosporidium NOT DETECTED NOT DETECTED Final   Cyclospora cayetanensis NOT DETECTED NOT DETECTED Final   Entamoeba histolytica NOT DETECTED NOT DETECTED Final   Giardia lamblia NOT DETECTED NOT DETECTED Final   Adenovirus F40/41 NOT DETECTED NOT DETECTED Final   Astrovirus NOT DETECTED NOT DETECTED Final   Norovirus GI/GII NOT DETECTED NOT DETECTED Final   Rotavirus A NOT DETECTED NOT DETECTED Final   Sapovirus (I, II, IV, and V) NOT DETECTED NOT DETECTED Final    Comment: Performed at [facility], [place],  [place] [address]  C Difficile Quick Screen w PCR reflex     Status: None   Collection Time: 03/10/22 12:11 PM   Specimen: STOOL  Result Value Ref Range Status   C Diff antigen NEGATIVE NEGATIVE Final   C Diff toxin NEGATIVE NEGATIVE Final   C Diff interpretation No C. difficile detected.  Final    Comment: Performed at [facility], [place] [address], [place], [place] [address]    Keith [name], [place] for Infectious Disease [facility] [email or web address] 03/11/2022, 2:12 PM

## 2022-03-11 NOTE — Progress Notes (Signed)
Right upper extremity vein mapping has been completed. Preliminary results can be found in CV Proc through chart review.   03/11/22 1:24 PM Keith [name] RVT

## 2022-03-11 NOTE — Progress Notes (Signed)
Requested to see pt for out-pt HD needs at d/c. Attempted to speak with pt at bedside but staff completing test at bedside. Attempted to speak to pt via phone but pt requested that navigator call back later this afternoon. Will attempt to speak with pt later today or tomorrow as time allows.   Melven Sartorius Renal Navigator [phone]

## 2022-03-12 ENCOUNTER — Inpatient Hospital Stay (HOSPITAL_COMMUNITY): Payer: Medicare Other

## 2022-03-12 DIAGNOSIS — N185 Chronic kidney disease, stage 5: Secondary | ICD-10-CM

## 2022-03-12 DIAGNOSIS — B20 Human immunodeficiency virus [HIV] disease: Secondary | ICD-10-CM | POA: Diagnosis not present

## 2022-03-12 DIAGNOSIS — R5381 Other malaise: Secondary | ICD-10-CM | POA: Diagnosis not present

## 2022-03-12 DIAGNOSIS — N179 Acute kidney failure, unspecified: Secondary | ICD-10-CM | POA: Diagnosis not present

## 2022-03-12 DIAGNOSIS — R197 Diarrhea, unspecified: Secondary | ICD-10-CM | POA: Diagnosis not present

## 2022-03-12 HISTORY — PX: IR FLUORO GUIDE CV LINE RIGHT: IMG2283

## 2022-03-12 HISTORY — PX: IR VENOCAVAGRAM SVC: IMG679

## 2022-03-12 HISTORY — PX: IR US GUIDE VASC ACCESS RIGHT: IMG2390

## 2022-03-12 LAB — IRON AND TIBC
Iron: 38 ug/dL — ABNORMAL LOW (ref 45–182)
Saturation Ratios: 24 % (ref 17.9–39.5)
TIBC: 157 ug/dL — ABNORMAL LOW (ref 250–450)
UIBC: 119 ug/dL

## 2022-03-12 LAB — CBC WITH DIFFERENTIAL/PLATELET
Abs Immature Granulocytes: 0.18 10*3/uL — ABNORMAL HIGH (ref 0.00–0.07)
Basophils Absolute: 0 10*3/uL (ref 0.0–0.1)
Basophils Relative: 0 %
Eosinophils Absolute: 0.1 10*3/uL (ref 0.0–0.5)
Eosinophils Relative: 2 %
HCT: 29 % — ABNORMAL LOW (ref 39.0–52.0)
Hemoglobin: 9.5 g/dL — ABNORMAL LOW (ref 13.0–17.0)
Immature Granulocytes: 2 %
Lymphocytes Relative: 23 %
Lymphs Abs: 1.9 10*3/uL (ref 0.7–4.0)
MCH: 26.8 pg (ref 26.0–34.0)
MCHC: 32.8 g/dL (ref 30.0–36.0)
MCV: 81.9 fL (ref 80.0–100.0)
Monocytes Absolute: 0.7 10*3/uL (ref 0.1–1.0)
Monocytes Relative: 9 %
Neutro Abs: 5.1 10*3/uL (ref 1.7–7.7)
Neutrophils Relative %: 64 %
Platelets: 316 10*3/uL (ref 150–400)
RBC: 3.54 MIL/uL — ABNORMAL LOW (ref 4.22–5.81)
RDW: 12.9 % (ref 11.5–15.5)
WBC: 8 10*3/uL (ref 4.0–10.5)
nRBC: 0 % (ref 0.0–0.2)

## 2022-03-12 LAB — RENAL FUNCTION PANEL
Albumin: 2.2 g/dL — ABNORMAL LOW (ref 3.5–5.0)
Anion gap: 18 — ABNORMAL HIGH (ref 5–15)
BUN: 93 mg/dL — ABNORMAL HIGH (ref 6–20)
CO2: 13 mmol/L — ABNORMAL LOW (ref 22–32)
Calcium: 6.2 mg/dL — CL (ref 8.9–10.3)
Chloride: 104 mmol/L (ref 98–111)
Creatinine, Ser: 12.91 mg/dL — ABNORMAL HIGH (ref 0.61–1.24)
GFR, Estimated: 4 mL/min — ABNORMAL LOW (ref >60)
Glucose, Bld: 106 mg/dL — ABNORMAL HIGH (ref 70–99)
Phosphorus: 7.7 mg/dL — ABNORMAL HIGH (ref 2.5–4.6)
Potassium: 3.5 mmol/L (ref 3.5–5.1)
Sodium: 135 mmol/L (ref 135–145)

## 2022-03-12 LAB — GLUCOSE, CAPILLARY
Glucose-Capillary: 101 mg/dL — ABNORMAL HIGH (ref 70–99)
Glucose-Capillary: 147 mg/dL — ABNORMAL HIGH (ref 70–99)
Glucose-Capillary: 200 mg/dL — ABNORMAL HIGH (ref 70–99)

## 2022-03-12 LAB — FERRITIN: Ferritin: 657 ng/mL — ABNORMAL HIGH (ref 24–336)

## 2022-03-12 LAB — HIV-1 RNA QUANT-NO REFLEX-BLD
HIV 1 RNA Quant: 8540 copies/mL
LOG10 HIV-1 RNA: 3.931 log10copy/mL

## 2022-03-12 LAB — HEPATITIS B SURFACE ANTIBODY, QUANTITATIVE: Hep B S AB Quant (Post): <3.1 m[IU]/mL — ABNORMAL LOW (ref >9.9)

## 2022-03-12 MED ORDER — CEFAZOLIN SODIUM-DEXTROSE 2-4 GM/100ML-% IV SOLN: 2.0000 g | INTRAVENOUS | Status: AC

## 2022-03-12 MED ORDER — FENTANYL CITRATE (PF) 100 MCG/2ML IJ SOLN
INTRAMUSCULAR | Status: AC | PRN
  Administered 2022-03-12 (×2): 12.5 ug via INTRAVENOUS

## 2022-03-12 MED ORDER — IOHEXOL 300 MG/ML  SOLN
50.0000 mL | Freq: Once | INTRAMUSCULAR | Status: AC | PRN
  Administered 2022-03-12: 10 mL via INTRA_ARTERIAL

## 2022-03-12 MED ORDER — CALCIUM ACETATE (PHOS BINDER) 667 MG PO CAPS
1334.0000 mg | ORAL_CAPSULE | Freq: Three times a day (TID) | ORAL | Status: DC
  Administered 2022-03-12 – 2022-03-18 (×15): 1334 mg via ORAL
  Filled 2022-03-12 (×17): qty 2

## 2022-03-12 MED ORDER — FENTANYL CITRATE (PF) 100 MCG/2ML IJ SOLN
INTRAMUSCULAR | Status: AC
  Filled 2022-03-12: qty 2

## 2022-03-12 MED ORDER — MIDAZOLAM HCL 2 MG/2ML IJ SOLN
INTRAMUSCULAR | Status: AC
  Filled 2022-03-12: qty 2

## 2022-03-12 MED ORDER — HEPARIN SODIUM (PORCINE) 1000 UNIT/ML IJ SOLN
INTRAMUSCULAR | Status: AC
  Administered 2022-03-12: 3800 [IU]
  Filled 2022-03-12: qty 10

## 2022-03-12 MED ORDER — MIDAZOLAM HCL 2 MG/2ML IJ SOLN
INTRAMUSCULAR | Status: AC | PRN
  Administered 2022-03-12 (×2): .5 mg via INTRAVENOUS

## 2022-03-12 MED ORDER — ANTICOAGULANT SODIUM CITRATE 4% (200MG/5ML) IV SOLN
5.0000 mL | Status: DC | PRN
  Filled 2022-03-12: qty 5

## 2022-03-12 MED ORDER — ALTEPLASE 2 MG IJ SOLR
2.0000 mg | Freq: Once | INTRAMUSCULAR | Status: DC | PRN
  Filled 2022-03-12: qty 2

## 2022-03-12 MED ORDER — HEPARIN SODIUM (PORCINE) 1000 UNIT/ML IJ SOLN
INTRAMUSCULAR | Status: AC
  Administered 2022-03-12: 1000 [IU]
  Filled 2022-03-12: qty 4

## 2022-03-12 MED ORDER — LIDOCAINE-EPINEPHRINE 1 %-1:100000 IJ SOLN
INTRAMUSCULAR | Status: AC
  Administered 2022-03-12: 12 mL
  Filled 2022-03-12: qty 1

## 2022-03-12 MED ORDER — HEPARIN SODIUM (PORCINE) 1000 UNIT/ML DIALYSIS
1000.0000 [IU] | INTRAMUSCULAR | Status: DC | PRN
  Filled 2022-03-12 (×2): qty 1

## 2022-03-12 MED ORDER — CEFAZOLIN SODIUM-DEXTROSE 2-4 GM/100ML-% IV SOLN
INTRAVENOUS | Status: AC
  Administered 2022-03-12: 2 g via INTRAVENOUS
  Filled 2022-03-12: qty 100

## 2022-03-12 NOTE — Progress Notes (Signed)
Received patient in bed to unit.  Alert and oriented.  Informed consent signed and in chart.    Patient tolerated well.  Transported back to the room  Alert, without acute distress.  Hand-off given to patient's nurse.   Access used: [facility] Access issues: none  Total UF removed: 1.1L Medication(s) given: none    [facility]

## 2022-03-12 NOTE — Consult Note (Signed)
Chief Complaint: Patient was seen in consultation today for tunneled dialysis catheter placement Chief Complaint  Patient presents with   Weakness   at the request of Dr Moshe Cipro  Supervising Physician: Markus Daft  Patient Status: [facility] - In-pt  History of Present Illness: Keith [name] is a 56 y.o. male   HIV; CAD/STEMI; CKD stage 4 DM; HTN; CVA To ED with general weakness; diarrhea found to have Cr of 12.6 Known baseline 3.5-4 per nephrology AKI felt secondary ATN No dialysis access  Dr Moshe Cipro note yesterday: # AKI  - with concern for possible progression to ESRD. No hydro  - Continue foley  - He states now that he is agreeable to start HD-  will order [facility] thru IR and also get VVS involved-  CLIP - Note that He is felt to be only a candidate for in-center HD per my assessment and his primary nephrologist - he is currently living in a motel   # CKD stage IV  - Secondary to DM  - Follows with Dr. Joelyn Oms at [facility] but was lost to follow-up after last visit this summer - high risk of progression to ESRD-  we think he is there-  labs if anything have gotten worse since admit  Scheduled now for tunneled dialysis catheter placement to initiate dialysis per Nephrology   Past Medical History:  Diagnosis Date   Abscess of right foot    abscess/ulcer of R transtibial amputation requiring IV abx   Acute ST elevation myocardial infarction (STEMI) due to occlusion of circumflex coronary artery ([place]) 03/22/2020   AIDS ([place])    Anemia    CAD (coronary artery disease)    a. MI with stenting of OM1 in 11/2018 with residual disease. b. acute STEMI 03/2020 s/p DES to OM1   Chronic anemia    Chronic knee pain    right   Chronic pain    CKD (chronic kidney disease), stage IV (HCC)    CVA (cerebral vascular accident) ([place]) 04/2020   Diabetes type 2, uncontrolled    HgA1c 17.6 (04/27/2010)   Diabetic foot ulcer ([place]) 01/2017   right foot   Dilatation of aorta (HCC)     Erectile dysfunction    Genital warts    GERD (gastroesophageal reflux disease)    History of blood transfusion    HIV (human immunodeficiency virus infection) ([place]) 2009   CD4 count 100, VL 13800 (05/01/2010)   Hyperlipidemia    Hypertension    Myocardial infarction [facility])    Neuropathy    Noncompliance with medication regimen    Osteomyelitis ([place])    h/o hand   Osteomyelitis of metatarsal ([place]) 04/28/2017   Pneumonia    STEMI (ST elevation myocardial infarction) ([place]) 11/10/2018    Past Surgical History:  Procedure Laterality Date   AMPUTATION Right 05/02/2017   Procedure: AMPUTATION TRANSMETARSAL;  Surgeon: [name], MD;  Location: [place];  Service: Orthopedics;  Laterality: Right;   AMPUTATION Right 06/13/2017   Procedure: RIGHT BELOW KNEE AMPUTATION;  Surgeon: [name], MD;  Location: [place];  Service: Orthopedics;  Laterality: Right;   AMPUTATION Left 10/27/2020   Procedure: LEFT BELOW KNEE AMPUTATION;  Surgeon: [name], MD;  Location: [place];  Service: Orthopedics;  Laterality: Left;   AMPUTATION Left 11/17/2020   Procedure: REVISION AMPUTATION BELOW KNEE, LEFT;  Surgeon: [name], MD;  Location: [place];  Service: Orthopedics;  Laterality: Left;   AMPUTATION Left 01/03/2021   Procedure:  LEFT ABOVE KNEE AMPUTATION;  Surgeon: [name], MD;  Location: [place];  Service: Orthopedics;  Laterality: Left;   APPLICATION OF WOUND VAC Left 10/27/2020   Procedure: APPLICATION OF WOUND VAC;  Surgeon: [name], MD;  Location: [place];  Service: Orthopedics;  Laterality: Left;   BELOW KNEE LEG AMPUTATION Right 06/13/2017   BELOW KNEE LEG AMPUTATION     CORONARY STENT INTERVENTION N/A 11/10/2018   Procedure: CORONARY STENT INTERVENTION;  Surgeon: Leonie Man, MD;  Location: [place] CV LAB;  Service: Cardiovascular;  Laterality: N/A;   CORONARY/GRAFT ACUTE MI REVASCULARIZATION N/A 03/21/2020   Procedure: Coronary/Graft Acute MI Revascularization;  Surgeon:  [name], MD;  Location: [place] CV LAB;  Service: Cardiovascular;  Laterality: N/A;   CYSTOSCOPY N/A 11/09/2020   Procedure: CYSTOSCOPY, EVACUATION OF CLOTS, FULGERATION OF BLADDER AND BILATERIAL RETROGRADE PYELOGRAMS;  Surgeon: Janith Lima, MD;  Location: [place];  Service: Urology;  Laterality: N/A;   HERNIA REPAIR     I & D EXTREMITY Left 08/21/2014   Procedure: INCISION AND DRAINAGE LEFT SMALL FINGER;  Surgeon: Leanora Cover, MD;  Location: [place];  Service: Orthopedics;  Laterality: Left;   I & D EXTREMITY Right 03/18/2017   Procedure: IRRIGATION AND DEBRIDEMENT EXTREMITY;  Surgeon: [name], MD;  Location: [place];  Service: Orthopedics;  Laterality: Right;   IR FLUORO GUIDE CV LINE RIGHT  11/02/2020   IR REMOVAL TUN CV CATH W/O FL  11/13/2020   IR US GUIDE VASC ACCESS RIGHT  11/02/2020   LEFT HEART CATH AND CORONARY ANGIOGRAPHY N/A 11/10/2018   Procedure: LEFT HEART CATH AND CORONARY ANGIOGRAPHY;  Surgeon: Leonie Man, MD;  Location: [place] CV LAB;  Service: Cardiovascular;  Laterality: N/A;   LEFT HEART CATH AND CORONARY ANGIOGRAPHY N/A 03/21/2020   Procedure: LEFT HEART CATH AND CORONARY ANGIOGRAPHY;  Surgeon: [name], MD;  Location: [place] CV LAB;  Service: Cardiovascular;  Laterality: N/A;   MINOR IRRIGATION AND DEBRIDEMENT OF WOUND Right 04/22/2014   Procedure: IRRIGATION AND DEBRIDEMENT OF RIGHT NECK ABCESS;  Surgeon: Jerrell Belfast, MD;  Location: [place];  Service: ENT;  Laterality: Right;   MULTIPLE EXTRACTIONS WITH ALVEOLOPLASTY N/A 01/18/2013   Procedure: MULTIPLE EXTRACION 3, 6, 7, 10, 11, 13, 21, 22, 27, 28, 29, 30 WITH ALVEOLOPLASTY;  Surgeon: Gae Bon, DDS;  Location: [place];  Service: Oral Surgery;  Laterality: N/A;   SKIN SPLIT GRAFT Right 03/21/2017   Procedure: IRRIGATION AND DEBRIDEMENT RIGHT FOOT AND APPLY SPLIT THICKNESS SKIN GRAFT AND WOUND VAC;  Surgeon: [name], MD;  Location: [place];  Service: Orthopedics;  Laterality:  Right;   STUMP REVISION Left 12/02/2020   Procedure: REVISION LEFT BELOW KNEE AMPUTATION;  Surgeon: [name], MD;  Location: [place];  Service: Orthopedics;  Laterality: Left;   TEE WITHOUT CARDIOVERSION N/A 05/02/2017   Procedure: TRANSESOPHAGEAL ECHOCARDIOGRAM (TEE);  Surgeon: Acie Fredrickson [place] Cheng, MD;  [facility]: [facility];  Service: Cardiovascular;  Laterality: N/A;  coincidental to orthopedic case    Allergies: Elemental sulfur, Metformin and related, Zetia [ezetimibe], and Sulfa antibiotics  Medications: Prior to Admission medications   Medication Sig Start Date End Date Taking? Authorizing Provider  pregabalin (LYRICA) 150 MG capsule Take 150 mg by mouth 2 (two) times daily. 02/18/22  Yes [provider]  Accu-Chek Softclix Lancets lancets Use as directed to check blood glucose 4 times a day. 01/01/22   Shelly Coss, MD  allopurinol (ZYLOPRIM) 100 MG tablet Take 100 mg by mouth daily.    [provider]  amLODipine (NORVASC) 10 MG tablet Take 10 mg by mouth daily.    [provider]  aspirin EC 81 MG tablet Take 81 mg by mouth daily. Swallow whole.    [provider]  atorvastatin (LIPITOR) 80 MG tablet Take 1 tablet (80 mg total) by mouth daily. 09/01/20 03/09/22  [name], MD  blood glucose meter kit and supplies KIT Use to check blood glucose up to four times daily as directed. 01/01/22   Shelly Coss, MD  carvedilol (COREG) 3.125 MG tablet Take 3.125 mg by mouth 2 (two) times daily with a meal.    [provider]  clopidogrel (PLAVIX) 75 MG tablet Take 1 tablet (75 mg total) by mouth daily. 11/15/21   Jettie Booze, MD  cyclobenzaprine (FLEXERIL) 5 MG tablet Take 5 mg by mouth 3 (three) times daily as needed for muscle spasms. 12/20/21   [provider]  docusate sodium (COLACE) 100 MG capsule Take 1 capsule (100 mg total) by mouth 2 (two) times daily as needed for mild constipation (take when no BM for 1 day.). Patient not  taking: Reported on 12/18/2021 12/15/21 12/15/22  [name], MD  dolutegravir-lamiVUDine (DOVATO) 50-300 MG tablet Take 1 tablet by mouth daily. 02/13/21   Golden Circle, FNP  doravirine (PIFELTRO) 100 MG TABS tablet Take 1 tablet (100 mg total) by mouth daily. 02/13/21   Golden Circle, FNP  escitalopram (LEXAPRO) 20 MG tablet Take 1 tablet (20 mg total) by mouth daily. 10/19/21   Hosie Poisson, MD  ferrous sulfate 325 (65 FE) MG EC tablet Take 325 mg by mouth daily with breakfast.    [provider]  ferrous sulfate 325 (65 FE) MG tablet Take 1 tablet (325 mg total) by mouth every Monday, Wednesday, and Friday at 6 PM. Do not take with other medicines. 10/19/21 03/09/22  Hosie Poisson, MD  finasteride (PROSCAR) 5 MG tablet Take 5 mg by mouth daily. 08/20/21   [provider]  gabapentin (NEURONTIN) 100 MG capsule Take 1 capsule (100 mg total) by mouth 3 (three) times daily. 02/03/22   Azucena Cecil, PA-C  glucose blood (ACCU-CHEK GUIDE) test strip Use as directed to check blood glucose 4 times a day. 01/01/22   Shelly Coss, MD  HUMALOG KWIKPEN 100 UNIT/ML KwikPen Inject 0-15 Units into the skin See admin instructions. Inject 0-15 units into the skin three times a day with meals, per sliding scale: CBG 70 - 120: 0 units; CBG 121 - 150: 2 units; CBG 151 - 200: 3 units; CBG 201 - 250: 5 units; CBG 251 - 300: 8 units; CBG 301 - 350: 11 units; CBG 351 - 400: 15 units    [provider]  hydrALAZINE (APRESOLINE) 10 MG tablet Take 10 mg by mouth 2 (two) times daily.    [provider]  hydrochlorothiazide (HYDRODIURIL) 12.5 MG tablet Take 12.5 mg by mouth daily.    [provider]  insulin glargine (LANTUS) 100 UNIT/ML Solostar Pen Inject 38 Units into the skin daily. 01/01/22   Shelly Coss, MD  insulin lispro (HUMALOG KWIKPEN) 100 UNIT/ML KwikPen Inject 10 Units into the skin 3 (three) times daily. 01/01/22   Shelly Coss, MD  Insulin Pen Needle  (UNIFINE PENTIPS) 32G X 6 MM MISC Use up to 4 pen needles per day as directed. 01/01/22   Shelly Coss, MD  Lancets (  ONETOUCH ULTRASOFT) lancets Use as instructed 02/16/20   [name], FNP  losartan (COZAAR) 50 MG tablet Take 50 mg by mouth daily.    [provider]  metFORMIN (GLUCOPHAGE) 1000 MG tablet Take 1,000 mg by mouth 2 (two) times daily. 12/21/21   [provider]  nitroGLYCERIN (NITROSTAT) 0.4 MG SL tablet Place 0.4 mg under the tongue every 5 (five) minutes as needed for chest pain.    [provider]  oxyCODONE (ROXICODONE) 15 MG immediate release tablet Take 15 mg by mouth 4 (four) times daily. 12/20/21   [provider]  oxyCODONE-acetaminophen (PERCOCET/ROXICET) 5-325 MG tablet Take 1 tablet by mouth every 8 (eight) hours as needed for severe pain. 02/03/22   Azucena Cecil, PA-C  pregabalin (LYRICA) 50 MG capsule Take 50 mg by mouth 2 (two) times daily. 12/20/21   [provider]  sodium bicarbonate 650 MG tablet Take 1 tablet (650 mg total) by mouth 3 (three) times daily. Patient taking differently: Take 650 mg by mouth 2 (two) times daily. 12/15/21   [name], MD     Family History  Problem Relation Age of Onset   Hypertension Mother    Arthritis Father    Hypertension Father    Hypertension Brother    Cancer Maternal Grandmother 9       unknown type of cancer   Depression Paternal Grandmother     Social History   Socioeconomic History   Marital status: Single    Spouse name: Not on file   Number of children: 3   Years of education: 12   Highest education level: Not on file  Occupational History   Occupation: disabled  Tobacco Use   Smoking status: Never   Smokeless tobacco: Never  Vaping Use   Vaping Use: Never used  Substance and Sexual Activity   Alcohol use: Not Currently   Drug use: Never    Comment: OTC ASA   Sexual activity: Yes    Partners: Female    Comment: declined condoms  Other  Topics Concern   Not on file  Social History Narrative   ** Merged History Encounter ** Worked for the city of [place] for 18 years.Unemployed. Applying for disability.Medicaid patient.   Single    Religion Holiness   01/03/21 left BKA surgery    PMH right BK with prosthesis   Staying with his mother [name]   Social Determinants of Health   Financial Resource Strain: Low Risk  (01/10/2021)   Overall Financial Resource Strain (CARDIA)    Difficulty of Paying Living Expenses: Not very hard  Food Insecurity: No Food Insecurity (12/29/2021)   Hunger Vital Sign    Worried About Running Out of Food in the Last Year: Never true    Ran Out of Food in the Last Year: Never true  Transportation Needs: No Transportation Needs (12/29/2021)   PRAPARE - Hydrologist (Medical): No    Lack of Transportation (Non-Medical): No  Physical Activity: Not on file  Stress: No Stress Concern Present (01/10/2021)   [place]    Feeling of Stress : Only a little  Social Connections: Not on file    Review of Systems: A 12 point ROS discussed and pertinent positives are indicated in the HPI above.  All other systems are negative.  Review of Systems  Constitutional:  Positive for activity change and fatigue. Negative for fever.  Respiratory:  Positive for shortness of breath. Negative for cough.   Gastrointestinal:  Positive for diarrhea and nausea. Negative for abdominal pain.  Neurological:  Positive for weakness.  Psychiatric/Behavioral:  Negative for behavioral problems and confusion.     Vital Signs: BP (!) 99/59 (BP Location: Left Arm)   Pulse 73   Temp 97.6 F (36.4 C) (Oral)   Resp 18   Wt 259 lb 11.2 oz (117.8 kg)   SpO2 96%   BMI 37.26 kg/m     Physical Exam Vitals reviewed.  HENT:     Mouth/Throat:     Mouth: Mucous membranes are moist.  Cardiovascular:     Rate and Rhythm: Normal rate  and regular rhythm.     Heart sounds: Normal heart sounds.  Pulmonary:     Breath sounds: Wheezing present.  Abdominal:     Palpations: Abdomen is soft.  Musculoskeletal:        General: Normal range of motion.  Skin:    General: Skin is warm.  Neurological:     Mental Status: He is alert and oriented to person, place, and time.  Psychiatric:        Mood and Affect: Mood normal.        Behavior: Behavior normal.        Thought Content: Thought content normal.        Judgment: Judgment normal.     Imaging: VAS [place] UPPER EXT VEIN MAPPING (PRE-OP AVF)  Result Date: 03/11/2022 UPPER EXTREMITY VEIN MAPPING Patient Name:  EMIL WEIGOLD  Date of Exam:   03/11/2022 Medical Rec #: [id number]       Accession #:    [id number] Date of Birth: [birth year]       Patient Gender: M Patient Age:   74 years Exam Location:  [facility] Procedure:      VAS [place] UPPER EXT VEIN MAPPING (PRE-OP AVF) Referring Phys: Corliss Parish --------------------------------------------------------------------------------  Indications: Pre-access. Limitations: patient positioning Comparison Study: No prior studies. Performing Technologist: Oliver Hum RVT  Examination Guidelines: A complete evaluation includes B-mode imaging, spectral Doppler, color Doppler, and power Doppler as needed of all accessible portions of each vessel. Bilateral testing is considered an integral part of a complete examination. Limited examinations for reoccurring indications may be performed as noted. +-----------------+-------------+----------+--------------+ Right Cephalic   Diameter (cm)Depth (cm)   Findings    +-----------------+-------------+----------+--------------+ Shoulder             0.43        2.26                  +-----------------+-------------+----------+--------------+ Prox upper arm       0.35        0.86                  +-----------------+-------------+----------+--------------+ Mid upper arm        0.40         0.63                  +-----------------+-------------+----------+--------------+ Dist upper arm       0.46        0.49     branching    +-----------------+-------------+----------+--------------+ Antecubital fossa    0.48        0.42                  +-----------------+-------------+----------+--------------+ Prox forearm  not visualized +-----------------+-------------+----------+--------------+ Mid forearm          0.34        0.74                  +-----------------+-------------+----------+--------------+ Dist forearm         0.35        0.41     branching    +-----------------+-------------+----------+--------------+ +-----------------+-------------+----------+---------+ Right Basilic    Diameter (cm)Depth (cm)Findings  +-----------------+-------------+----------+---------+ Shoulder             0.68        2.30             +-----------------+-------------+----------+---------+ Prox upper arm       0.50        2.19             +-----------------+-------------+----------+---------+ Mid upper arm        0.47        1.37   branching +-----------------+-------------+----------+---------+ Dist upper arm       0.55        1.06             +-----------------+-------------+----------+---------+ Antecubital fossa    0.55        0.82             +-----------------+-------------+----------+---------+ Prox forearm         0.43        0.32   branching +-----------------+-------------+----------+---------+ Mid forearm          0.35        0.25   branching +-----------------+-------------+----------+---------+ Distal forearm       0.34        0.25             +-----------------+-------------+----------+---------+ *See table(s) above for measurements and observations.  Diagnosing physician: Servando Snare MD Electronically signed by Servando Snare MD on 03/11/2022 at 4:37:46 PM.    Final    US Abdomen Limited RUQ  (LIVER/GB)  Result Date: 03/09/2022 CLINICAL DATA:  [id number] Cholelithiasis [id number] EXAM: ULTRASOUND ABDOMEN LIMITED COMPARISON:  None Available. FINDINGS: The liver demonstrates normal parenchymal echogenicity and homogeneous texture without focal hepatic parenchymal lesions or intrahepatic ductal dilatation is identified. Gallbladder wall is thickened. There is evidence of sludge. A few small shadowing stones are demonstrated. No pericholecystic fluid identified. CBD 0.2 cm. Pancreas not visualized. IMPRESSION: Abnormal gallbladder with sludge, stones and wall thickening. Electronically Signed   By: Sammie Bench M.D.   On: 03/09/2022 09:40   CT ABDOMEN PELVIS WO CONTRAST  Result Date: 03/09/2022 CLINICAL DATA:  Acute kidney failure EXAM: CT ABDOMEN AND PELVIS WITHOUT CONTRAST TECHNIQUE: Multidetector CT imaging of the abdomen and pelvis was performed following the standard protocol without IV contrast. RADIATION DOSE REDUCTION: This exam was performed according to the departmental dose-optimization program which includes automated exposure control, adjustment of the mA and/or kV according to patient size and/or use of iterative reconstruction technique. COMPARISON:  Prior CT scan of the abdomen and pelvis 12/18/2021 FINDINGS: Lower chest: No acute abnormality. Similar appearance of small pulmonary nodules and tree-in-bud micro nodularity in the periphery of the right lower lobe. Borderline cardiomegaly. Hepatobiliary: Cholelithiasis. There is some suggestion of submucosal edema involving the gallbladder, however the area is marred by mild motion artifact and beam hardening artifact. Normal hepatic contour and morphology. No biliary ductal dilatation. Pancreas: Unremarkable. No pancreatic ductal dilatation or surrounding inflammatory changes. Spleen: Normal in size without focal abnormality. Adrenals/Urinary Tract: Adrenal glands are unremarkable. Kidneys  are normal, without renal calculi, focal lesion, or  hydronephrosis. Foley catheter present within the bladder. Stomach/Bowel: No focal bowel wall thickening or evidence of obstruction. Vascular/Lymphatic: Limited evaluation in the absence of intravenous contrast. No aneurysm. No suspicious lymphadenopathy. Reproductive: Prostate is unremarkable. Other: No abdominal wall hernia or abnormality. No abdominopelvic ascites. Musculoskeletal: No acute fracture or aggressive appearing lytic or blastic osseous lesion. IMPRESSION: 1. Cholelithiasis with questionable gallbladder wall thickening in a region of artifact. Acute cholecystitis is a consideration. Consider further evaluation with right upper quadrant abdominal ultrasound. 2. Otherwise, no acute abnormality within the abdomen or pelvis. 3. Additional ancillary findings as above. Electronically Signed   By: Jacqulynn Cadet M.D.   On: 03/09/2022 08:07   DG Chest Port 1 View  Result Date: 03/09/2022 CLINICAL DATA:  [place] EXAM: PORTABLE CHEST 1 VIEW COMPARISON:  12/29/2021 FINDINGS: Heart and mediastinal contours are within normal limits. No focal opacities or effusions. No acute bony abnormality. IMPRESSION: No active disease. Electronically Signed   By: Rolm Baptise M.D.   On: 03/09/2022 02:05   CT CERVICAL SPINE WO CONTRAST  Result Date: 02/12/2022 CLINICAL DATA:  56 year old male tripped over wheelchair. Pain. Hypertensive. EXAM: CT CERVICAL SPINE WITHOUT CONTRAST TECHNIQUE: Multidetector CT imaging of the cervical spine was performed without intravenous contrast. Multiplanar CT image reconstructions were also generated. RADIATION DOSE REDUCTION: This exam was performed according to the departmental dose-optimization program which includes automated exposure control, adjustment of the mA and/or kV according to patient size and/or use of iterative reconstruction technique. COMPARISON:  Head CT today.  Neck CT 04/20/2014. FINDINGS: Alignment: Chronic straightening of cervical lordosis appears stable since  2016. Cervicothoracic junction alignment is within normal limits. Bilateral posterior element alignment is within normal limits. Skull base and vertebrae: Bone mineralization is within normal limits. Visualized skull base is intact. No atlanto-occipital dissociation. C1 and C2 appear intact and aligned. No acute osseous abnormality identified. Soft tissues and spinal canal: No prevertebral fluid or swelling. No visible canal hematoma. Negative noncontrast visible neck soft tissues. Disc levels: Chronic disc and endplate degeneration at C5-C6 is advanced and appears progressed since 2016. Possible mild spinal stenosis there. Moderate C6-C7 degeneration. Upper chest: Asymmetric sclerosis of the medial right clavicle appears to be degenerative, with associated progressed osteophytosis and small subchondral cysts there since 2016. Negative lung apices, visible noncontrast superior mediastinum. Visible upper thoracic levels appear intact. IMPRESSION: 1. No acute traumatic injury identified in the cervical spine. 2. Advanced chronic C5-C6 > C6-C7 degeneration. Possible mild spinal stenosis. Electronically Signed   By: Genevie Ann M.D.   On: 02/12/2022 06:06   DG Lumbar Spine Complete  Result Date: 02/12/2022 CLINICAL DATA:  Low back pain after a fall. EXAM: LUMBAR SPINE - COMPLETE 4+ VIEW COMPARISON:  None Available. FINDINGS: There is no evidence of lumbar spine fracture. Alignment is normal. Intervertebral disc spaces are maintained. IMPRESSION: Negative. Electronically Signed   By: Misty Stanley M.D.   On: 02/12/2022 06:01   CT HEAD WO CONTRAST (5MM)  Result Date: 02/12/2022 CLINICAL DATA:  56 year old male tripped over wheelchair. Pain. Hypertensive. EXAM: CT HEAD WITHOUT CONTRAST TECHNIQUE: Contiguous axial images were obtained from the base of the skull through the vertex without intravenous contrast. RADIATION DOSE REDUCTION: This exam was performed according to the departmental dose-optimization program which  includes automated exposure control, adjustment of the mA and/or kV according to patient size and/or use of iterative reconstruction technique. COMPARISON:  Head CT 09/10/2021.  Brain MRI 04/25/2020. FINDINGS: Brain: Stable  cerebral volume. No midline shift, ventriculomegaly, mass effect, evidence of mass lesion, intracranial hemorrhage or evidence of cortically based acute infarction. Normal for age supratentorial gray-white matter differentiation. Chronic small vessel disease in the brainstem better demonstrated by MRI. Vascular: Mild calcified atherosclerosis at the skull base. No suspicious intracranial vascular hyperdensity. Skull: Chronic bilateral lamina papyracea fractures. No acute osseous abnormality identified. Sinuses/Orbits: Chronic lamina papyracea fractures and stable mild to moderate right maxillary sinus mucosal thickening. Tympanic cavities and mastoids remain clear. Other: No acute orbit or scalp soft tissue injury identified. IMPRESSION: 1. No acute traumatic injury identified. 2. Stable non contrast CT appearance of the brain, with chronic brainstem small vessel disease better demonstrated by MRI. Electronically Signed   By: Genevie Ann M.D.   On: 02/12/2022 06:01    Labs:  CBC: Recent Labs    03/09/22 0857 03/10/22 0121 03/11/22 0308 03/12/22 0323  WBC 8.6 6.6 7.2 8.0  HGB 10.4* 10.4* 9.6* 9.5*  HCT 32.6* 32.2* 29.8* 29.0*  PLT 271 283 273 316    COAGS: No results for input(s): "INR", "APTT" in the last 8760 hours.  BMP: Recent Labs    03/09/22 1907 03/10/22 0121 03/10/22 0942 03/10/22 1516 03/11/22 0308 03/12/22 0323  NA 131* 131*  --   --  132* 135  K 2.7* 2.5* 2.6* 2.7* 3.7 3.5  CL 99 100  --   --  103 104  CO2 14* 11*  --   --  11* 13*  GLUCOSE 165* 142*  --   --  114* 106*  BUN 91* 88*  --   --  89* 93*  CALCIUM 6.2* 6.2*  --   --  5.9* 6.2*  CREATININE 12.50* 12.88*  --   --  13.34* 12.91*  GFRNONAA 4* 4*  --   --  4* 4*    LIVER FUNCTION TESTS: Recent  Labs    12/29/21 1725 12/30/21 0034 02/02/22 2248 03/09/22 0115 03/09/22 1907 03/10/22 0121 03/11/22 0308 03/12/22 0323  BILITOT 0.6 0.6 0.5 0.6  --   --   --   --   AST 20 14* 14* 33  --   --   --   --   ALT _0 --   --   --   --   ALKPHOS 129* 102 103 65  --   --   --   --   PROT 7.9 7.1 7.2 8.7*  --   --   --   --   ALBUMIN 3.1* 2.8* 3.0* 2.7* 2.3* 2.1* 2.1* 2.2*    TUMOR MARKERS: No results for input(s): "AFPTM", "CEA", "CA199", "CHROMGRNA" in the last 8760 hours.  Assessment and Plan:  CKD4; progression now to ESRD Needs dialysis access to initiate dialysis asap per Nephrology Scheduled for tunneled dialysis catheter placement in IR today Risks and benefits discussed with the patient including, but not limited to bleeding, infection, vascular injury, pneumothorax which may require chest tube placement, air embolism or even death  All of the patient's questions were answered, patient is agreeable to proceed. Consent signed and in chart.  Thank you for this interesting consult.  I greatly enjoyed meeting ANDER WAMSER and look forward to participating in their care.  A copy of this report was sent to the requesting provider on this date.  Electronically Signed: Lavonia Drafts, PA-C 03/12/2022, 6:55 AM   I spent a total of 20 Minutes    in face to face in  clinical consultation, greater than 50% of which was counseling/coordinating care for tunneled dialysis catheter placement

## 2022-03-12 NOTE — Progress Notes (Signed)
Palliative Care Progress Note, Assessment & Plan   Patient Name: Keith [name]       Date: 03/12/2022 DOB: [birth year]  Age: 56 y.o. MRN#: [id number] Attending Physician: Barb Merino, MD Primary Care Physician: [name], NP Admit Date: 03/09/2022  Reason for Consultation/Follow-up: Establishing goals of care  Subjective: Patient is lying in bed in no apparent distress. He is sleeping soundly but awakens to my voice. He acknowledges my presence, is able to make his wishes known, but quickly goes back to sleep. No family present at bedside.  HPI: 56 y.o. male  with past medical history of CAD, CKD (stage 4), HIV (Dovato and Pifeltro - intermittent compliance), DM2, bialteral leg amputation, BPH, and GERD admitted on 03/09/2022 with diarrhea.   Patient is being treated for AKI r/t diarrheal illness from salmonella and epec. Diarrhea has improved with treatment of ceftriaxone and cipro. Patient remains in AKI (Cr 13.34 as of 12/4) and has agreed to proceed with HD.    PMT was consulted in light of patient's significant illness and new need for dialysis.  Summary of counseling/coordination of care: After reviewing the patient's chart and assessing the patient at bedside, I made multiple attempts to engage with him in regards to plan of care, symptoms, and goals of care. Patient would not engage in conversation with me. This was my second attempt to speak with patient.   With patient's permission, I spoke with patient's mother over the phone. Brief medical update given. She she shares patient has always been private and hitting his medical treatment from her.  She shares she does not really know if he is doing well or not until he gets really bad.  She is concerned about him and plans to visit with him  tomorrow.  She has classes in the morning and has to take multiple buses in order to get to the hospital.  However, she plans to be at the hospital after 2pm tomorrow. We plan to meet bedside at 2:30pm.  PMT contact information given for mother to reach out if plans change for her.  Ongoing goals of care discussions and support to be provided by PMT.  Physical Exam Vitals reviewed.  Constitutional:      General: He is not in acute distress.    Appearance: He is obese.  Eyes:     Pupils: Pupils are equal, round, and reactive to light.  Cardiovascular:     Rate and Rhythm: Normal rate.     Pulses: Normal pulses.  Pulmonary:     Effort: Pulmonary effort is normal.  Abdominal:     Palpations: Abdomen is soft.  Musculoskeletal:     Comments: Moves upper extremities to command  Neurological:     Mental Status: He is alert and oriented to person, place, and time.  Psychiatric:        Mood and Affect: Mood normal.        Behavior: Behavior normal.        Thought Content: Thought content normal.        Judgment: Judgment normal.             Palliative Assessment/Data: 40%    Total Time 35  minutes  Greater than 50%  of this time was spent counseling and coordinating care related to the above assessment and plan.  Thank you for allowing the Palliative Medicine Team to assist in the care of this patient.  Benedict Ilsa Iha, FNP-BC Palliative Medicine Team Team Phone # [phone]

## 2022-03-12 NOTE — Consult Note (Addendum)
Hospital Consult VASCULAR SURGERY ASSESSMENT & PLAN:   END-STAGE RENAL DISEASE: We have been asked to place access for hemodialysis on this patient.  He currently has a functioning right IJ tunneled dialysis catheter.  He is only had vein map on the right side which shows reasonable veins on the right.  As he is right-handed and would prefer access on the left we have ordered vein map on the left.  If he has adequate vein there we can potentially place a fistula on the left on Thursday.  He has an IV in the left arm and pending the results of his vein map this will have to be moved.  In addition we will have to to try to rearrange his dialysis schedule so he can have surgery on Thursday.  He has palpable radial pulses.  Of note, he has acute gastroenteritis with Salmonella and is being treated with ciprofloxacin.  Thus we would not want to place a graft at this time but would only consider a fistula.  He is HIV positive.  Gae Gallop, MD 4:27 PM   Reason for Consult:  permanent access Requesting Physician:  Dr. Moshe Cipro MRN #:  [id number]  History of Present Illness: This is a 56 y.o. male with end-stage renal disease on hemodialysis being seen in consultation for evaluation of new permanent access.  He underwent right IJ TDC placement by interventional radiology this morning.  He is right arm dominant and would prefer access placement in his left arm.  He has not had any prior surgery to his left arm.  He does not have a pacemaker and does not take blood thinners.  Surgical history significant for left above-the-knee amputation by [name] last year.  He is also currently being treated for Salmonella as well as E. coli infection.  Vein mapping was performed on his right arm however it was not performed on his left arm prior to my assessment.  Past Medical History:  Diagnosis Date   Abscess of right foot    abscess/ulcer of R transtibial amputation requiring IV abx   Acute ST elevation  myocardial infarction (STEMI) due to occlusion of circumflex coronary artery ([place]) 03/22/2020   AIDS ([place])    Anemia    CAD (coronary artery disease)    a. MI with stenting of OM1 in 11/2018 with residual disease. b. acute STEMI 03/2020 s/p DES to OM1   Chronic anemia    Chronic knee pain    right   Chronic pain    CKD (chronic kidney disease), stage IV (HCC)    CVA (cerebral vascular accident) ([place]) 04/2020   Diabetes type 2, uncontrolled    HgA1c 17.6 (04/27/2010)   Diabetic foot ulcer ([place]) 01/2017   right foot   Dilatation of aorta (HCC)    Erectile dysfunction    Genital warts    GERD (gastroesophageal reflux disease)    History of blood transfusion    HIV (human immunodeficiency virus infection) ([place]) 2009   CD4 count 100, VL 13800 (05/01/2010)   Hyperlipidemia    Hypertension    Myocardial infarction [facility])    Neuropathy    Noncompliance with medication regimen    Osteomyelitis ([place])    h/o hand   Osteomyelitis of metatarsal ([place]) 04/28/2017   Pneumonia    STEMI (ST elevation myocardial infarction) ([place]) 11/10/2018    Past Surgical History:  Procedure Laterality Date   AMPUTATION Right 05/02/2017   Procedure: AMPUTATION TRANSMETARSAL;  Surgeon: [name], MD;  Location: [place];  Service: Orthopedics;  Laterality: Right;   AMPUTATION Right 06/13/2017   Procedure: RIGHT BELOW KNEE AMPUTATION;  Surgeon: [name], MD;  Location: [place];  Service: Orthopedics;  Laterality: Right;   AMPUTATION Left 10/27/2020   Procedure: LEFT BELOW KNEE AMPUTATION;  Surgeon: [name], MD;  Location: [place];  Service: Orthopedics;  Laterality: Left;   AMPUTATION Left 11/17/2020   Procedure: REVISION AMPUTATION BELOW KNEE, LEFT;  Surgeon: [name], MD;  Location: [place];  Service: Orthopedics;  Laterality: Left;   AMPUTATION Left 01/03/2021   Procedure: LEFT ABOVE KNEE AMPUTATION;  Surgeon: [name], MD;  Location: [place];  Service: Orthopedics;  Laterality: Left;    APPLICATION OF WOUND VAC Left 10/27/2020   Procedure: APPLICATION OF WOUND VAC;  Surgeon: [name], MD;  Location: [place];  Service: Orthopedics;  Laterality: Left;   BELOW KNEE LEG AMPUTATION Right 06/13/2017   BELOW KNEE LEG AMPUTATION     CORONARY STENT INTERVENTION N/A 11/10/2018   Procedure: CORONARY STENT INTERVENTION;  Surgeon: Leonie Man, MD;  Location: [place] CV [facility];  Service: Cardiovascular;  Laterality: N/A;   CORONARY/GRAFT ACUTE MI REVASCULARIZATION N/A 03/21/2020   Procedure: Coronary/Graft Acute MI Revascularization;  Surgeon: [name], MD;  Location: [place] CV LAB;  Service: Cardiovascular;  Laterality: N/A;   CYSTOSCOPY N/A 11/09/2020   Procedure: CYSTOSCOPY, EVACUATION OF CLOTS, FULGERATION OF BLADDER AND BILATERIAL RETROGRADE PYELOGRAMS;  Surgeon: Janith Lima, MD;  Location: [place];  Service: Urology;  Laterality: N/A;   HERNIA REPAIR     I & D EXTREMITY Left 08/21/2014   Procedure: INCISION AND DRAINAGE LEFT SMALL FINGER;  Surgeon: Leanora Cover, MD;  Location: [place];  Service: Orthopedics;  Laterality: Left;   I & D EXTREMITY Right 03/18/2017   Procedure: IRRIGATION AND DEBRIDEMENT EXTREMITY;  Surgeon: [name], MD;  Location: [place];  Service: Orthopedics;  Laterality: Right;   IR FLUORO GUIDE CV LINE RIGHT  11/02/2020   IR FLUORO GUIDE CV LINE RIGHT  03/12/2022   IR REMOVAL TUN CV CATH W/O FL  11/13/2020   IR US GUIDE VASC ACCESS RIGHT  11/02/2020   IR US GUIDE VASC ACCESS RIGHT  03/12/2022   LEFT HEART CATH AND CORONARY ANGIOGRAPHY N/A 11/10/2018   Procedure: LEFT HEART CATH AND CORONARY ANGIOGRAPHY;  Surgeon: Leonie Man, MD;  Location: [place] CV [facility];  Service: Cardiovascular;  Laterality: N/A;   LEFT HEART CATH AND CORONARY ANGIOGRAPHY N/A 03/21/2020   Procedure: LEFT HEART CATH AND CORONARY ANGIOGRAPHY;  Surgeon: [name], MD;  Location: [place] CV [facility];  Service: Cardiovascular;  Laterality: N/A;   MINOR IRRIGATION AND  DEBRIDEMENT OF WOUND Right 04/22/2014   Procedure: IRRIGATION AND DEBRIDEMENT OF RIGHT NECK ABCESS;  Surgeon: Jerrell Belfast, MD;  Location: [place];  Service: ENT;  Laterality: Right;   MULTIPLE EXTRACTIONS WITH ALVEOLOPLASTY N/A 01/18/2013   Procedure: MULTIPLE EXTRACION 3, 6, 7, 10, 11, 13, 21, 22, 27, 28, 29, 30 WITH ALVEOLOPLASTY;  Surgeon: Gae Bon, DDS;  Location: [place];  Service: Oral Surgery;  Laterality: N/A;   SKIN SPLIT GRAFT Right 03/21/2017   Procedure: IRRIGATION AND DEBRIDEMENT RIGHT FOOT AND APPLY SPLIT THICKNESS SKIN GRAFT AND WOUND VAC;  Surgeon: [name], MD;  Location: [place];  Service: Orthopedics;  Laterality: Right;   STUMP REVISION Left 12/02/2020   Procedure: REVISION LEFT BELOW KNEE AMPUTATION;  Surgeon: Sharol Given,  Illene Regulus, MD;  Location: [place];  Service: Orthopedics;  Laterality: Left;   TEE WITHOUT CARDIOVERSION N/A 05/02/2017   Procedure: TRANSESOPHAGEAL ECHOCARDIOGRAM (TEE);  Surgeon: Acie Fredrickson Wonda Cheng, MD;  Location[facility];  Service: Cardiovascular;  Laterality: N/A;  coincidental to orthopedic case    Allergies  Allergen Reactions   Elemental Sulfur Itching    Patient stated he's allergic to "sulfur" AND "sulfa"   Metformin And Related Other (See Comments)    Stopped by MD- affected kidneys   Zetia [Ezetimibe] Nausea Only   Sulfa Antibiotics Itching    Prior to Admission medications   Medication Sig Start Date End Date Taking? Authorizing Provider  pregabalin (LYRICA) 150 MG capsule Take 150 mg by mouth 2 (two) times daily. 02/18/22  Yes [provider]  allopurinol (ZYLOPRIM) 100 MG tablet Take 100 mg by mouth daily.    [provider]  amLODipine (NORVASC) 10 MG tablet Take 10 mg by mouth daily.    [provider]  aspirin EC 81 MG tablet Take 81 mg by mouth daily. Swallow whole.    [provider]  atorvastatin (LIPITOR) 80 MG tablet Take 1 tablet (80 mg total) by mouth daily. 09/01/20 03/09/22  [name], MD   carvedilol (COREG) 3.125 MG tablet Take 3.125 mg by mouth 2 (two) times daily with a meal.    [provider]  clopidogrel (PLAVIX) 75 MG tablet Take 1 tablet (75 mg total) by mouth daily. 11/15/21   Jettie Booze, MD  cyclobenzaprine (FLEXERIL) 5 MG tablet Take 5 mg by mouth 3 (three) times daily as needed for muscle spasms. 12/20/21   [provider]  docusate sodium (COLACE) 100 MG capsule Take 1 capsule (100 mg total) by mouth 2 (two) times daily as needed for mild constipation (take when no BM for 1 day.). Patient not taking: Reported on 12/18/2021 12/15/21 12/15/22  [name], MD  dolutegravir-lamiVUDine (DOVATO) 50-300 MG tablet Take 1 tablet by mouth daily. 02/13/21   Golden Circle, FNP  doravirine (PIFELTRO) 100 MG TABS tablet Take 1 tablet (100 mg total) by mouth daily. 02/13/21   Golden Circle, FNP  escitalopram (LEXAPRO) 20 MG tablet Take 1 tablet (20 mg total) by mouth daily. 10/19/21   Hosie Poisson, MD  ferrous sulfate 325 (65 FE) MG EC tablet Take 325 mg by mouth daily with breakfast.    [provider]  ferrous sulfate 325 (65 FE) MG tablet Take 1 tablet (325 mg total) by mouth every Monday, Wednesday, and Friday at 6 PM. Do not take with other medicines. 10/19/21 03/09/22  Hosie Poisson, MD  finasteride (PROSCAR) 5 MG tablet Take 5 mg by mouth daily. 08/20/21   [provider]  gabapentin (NEURONTIN) 100 MG capsule Take 1 capsule (100 mg total) by mouth 3 (three) times daily. 02/03/22   Azucena Cecil, PA-C  HUMALOG KWIKPEN 100 UNIT/ML KwikPen Inject 0-15 Units into the skin See admin instructions. Inject 0-15 units into the skin three times a day with meals, per sliding scale: CBG 70 - 120: 0 units; CBG 121 - 150: 2 units; CBG 151 - 200: 3 units; CBG 201 - 250: 5 units; CBG 251 - 300: 8 units; CBG 301 - 350: 11 units; CBG 351 - 400: 15 units    [provider]  hydrALAZINE (APRESOLINE) 10 MG tablet Take 10 mg by mouth 2  (two) times daily.    [provider]  hydrochlorothiazide (HYDRODIURIL) 12.5 MG  tablet Take 12.5 mg by mouth daily.    [provider]  insulin glargine (LANTUS) 100 UNIT/ML Solostar Pen Inject 38 Units into the skin daily. 01/01/22   Shelly Coss, MD  insulin lispro (HUMALOG KWIKPEN) 100 UNIT/ML KwikPen Inject 10 Units into the skin 3 (three) times daily. 01/01/22   Shelly Coss, MD  Lancets [facility] ULTRASOFT) lancets Use as instructed 02/16/20   [name], FNP  losartan (COZAAR) 50 MG tablet Take 50 mg by mouth daily.    [provider]  metFORMIN (GLUCOPHAGE) 1000 MG tablet Take 1,000 mg by mouth 2 (two) times daily. 12/21/21   [provider]  nitroGLYCERIN (NITROSTAT) 0.4 MG SL tablet Place 0.4 mg under the tongue every 5 (five) minutes as needed for chest pain.    [provider]  oxyCODONE (ROXICODONE) 15 MG immediate release tablet Take 15 mg by mouth 4 (four) times daily. 12/20/21   [provider]  oxyCODONE-acetaminophen (PERCOCET/ROXICET) 5-325 MG tablet Take 1 tablet by mouth every 8 (eight) hours as needed for severe pain. 02/03/22   Azucena Cecil, PA-C  pregabalin (LYRICA) 50 MG capsule Take 50 mg by mouth 2 (two) times daily. 12/20/21   [provider]  sodium bicarbonate 650 MG tablet Take 1 tablet (650 mg total) by mouth 3 (three) times daily. Patient taking differently: Take 650 mg by mouth 2 (two) times daily. 12/15/21   [name], MD    Social History   Socioeconomic History   Marital status: Single    Spouse name: Not on file   Number of children: 3   Years of education: 66   Highest education level: Not on file  Occupational History   Occupation: disabled  Tobacco Use   Smoking status: Never   Smokeless tobacco: Never  Vaping Use   Vaping Use: Never used  Substance and Sexual Activity   Alcohol use: Not Currently   Drug use: Never    Comment: OTC ASA   Sexual activity: Yes     Partners: Female    Comment: declined condoms  Other Topics Concern   Not on file  Social History Narrative   ** Merged History Encounter ** Worked for the city of [place] for 18 years.Unemployed. Applying for disability.Medicaid patient.   Single    Religion Holiness   01/03/21 left BKA surgery    PMH right BK with prosthesis   Staying with his mother [name]   Social Determinants of Health   Financial Resource Strain: Low Risk  (01/10/2021)   Overall Financial Resource Strain (CARDIA)    Difficulty of Paying Living Expenses: Not very hard  Food Insecurity: No Food Insecurity (12/29/2021)   Hunger Vital Sign    Worried About Running Out of Food in the Last Year: Never true    Ran Out of Food in the Last Year: Never true  Transportation Needs: No Transportation Needs (12/29/2021)   PRAPARE - Hydrologist (Medical): No    Lack of Transportation (Non-Medical): No  Physical Activity: Not on file  Stress: No Stress Concern Present (01/10/2021)   [place]    Feeling of Stress : Only a little  Social Connections: Not on file  Intimate Partner Violence: Not At Risk (12/29/2021)   Humiliation, Afraid, Rape, and Kick questionnaire    Fear of Current or Ex-Partner: No    Emotionally Abused: No    Physically Abused: No  Sexually Abused: No     Family History  Problem Relation Age of Onset   Hypertension Mother    Arthritis Father    Hypertension Father    Hypertension Brother    Cancer Maternal Grandmother 18       unknown type of cancer   Depression Paternal Grandmother     ROS: Otherwise negative unless mentioned in HPI  Physical Examination  Vitals:   03/12/22 0910 03/12/22 0915  BP: 112/64 109/60  Pulse: 78 79  Resp: 16 20  Temp:    SpO2: 99% 100%   Body mass index is 37.26 kg/m.  General:  WDWN in NAD Gait: Not observed HENT: WNL, normocephalic Pulmonary: normal  non-labored breathing, without Rales, rhonchi,  wheezing Cardiac: regular Abdomen:  soft, NT/ND, no masses Skin: without rashes Vascular Exam/Pulses: Symmetrical radial pulses Musculoskeletal: no muscle wasting or atrophy  Neurologic: A&O X 3;  No focal weakness or paresthesias are detected; speech is fluent/normal Psychiatric:  The pt has Normal affect. Lymph:  Unremarkable  CBC    Component Value Date/Time   WBC 8.0 03/12/2022 0323   RBC 3.54 (L) 03/12/2022 0323   HGB 9.5 (L) 03/12/2022 0323   HGB 12.1 (L) 09/07/2018 1056   HCT 29.0 (L) 03/12/2022 0323   HCT 37.5 09/07/2018 1056   PLT 316 03/12/2022 0323   PLT 252 09/07/2018 1056   MCV 81.9 03/12/2022 0323   MCV 82 09/07/2018 1056   MCH 26.8 03/12/2022 0323   MCHC 32.8 03/12/2022 0323   RDW 12.9 03/12/2022 0323   RDW 12.6 09/07/2018 1056   LYMPHSABS 1.9 03/12/2022 0323   LYMPHSABS 1.4 09/07/2018 1056   MONOABS 0.7 03/12/2022 0323   EOSABS 0.1 03/12/2022 0323   EOSABS 0.1 09/07/2018 1056   BASOSABS 0.0 03/12/2022 0323   BASOSABS 0.0 09/07/2018 1056    BMET    Component Value Date/Time   NA 135 03/12/2022 0323   NA 132 (L) 09/07/2018 1056   K 3.5 03/12/2022 0323   CL 104 03/12/2022 0323   CO2 13 (L) 03/12/2022 0323   GLUCOSE 106 (H) 03/12/2022 0323   BUN 93 (H) 03/12/2022 0323   BUN 21 09/07/2018 1056   CREATININE 12.91 (H) 03/12/2022 0323   CREATININE 3.51 (H) 08/17/2021 1215   CALCIUM 6.2 (LL) 03/12/2022 0323   GFRNONAA 4 (L) 03/12/2022 0323   GFRNONAA 27 (L) 01/20/2017 1244   GFRAA 35 (L) 08/23/2019 0904   GFRAA 31 (L) 01/20/2017 1244    COAGS: Lab Results  Component Value Date   INR 1.0 04/25/2020   INR 1.0 03/21/2020   INR 1.16 04/28/2017     Non-Invasive Vascular Imaging:   Adequate right cephalic and right basilic vein for conduit use    ASSESSMENT/PLAN: This is a 56 y.o. male with end-stage renal disease now on HD  -Vein mapping of the right arm has been completed and demonstrates  adequate right basilic and cephalic vein for conduit use.  He will also need his left arm mapped and I will place this order.  He is right arm dominant and would prefer access placement in his left arm.  Please restrict left upper extremity.  He is actively being treated for E. coli and Salmonella infection.Plan would be left arm AV fistula placement during current admission.  On-call vascular surgeon Dr. Scot Dock will evaluate the patient later today and provide further treatment plans.   Dagoberto Ligas PA-C [facility] [phone]

## 2022-03-12 NOTE — Progress Notes (Signed)
[facility] Progress Note   Subjective:  Had [facility] placed this AM-  awaiting his first HD treatment.  Did have 1200 of UOP but BUN and crt remain poor.  Sleepy after his TDC procedure     56 y.o. male with HIV, CAD with hx STEMI, CKD stage IV, DM type 2, HTN, and prior CVA who presented  with generalized weakness and watery, non-bloody diarrhea.  He was found to have creatinine markedly elevated at 12.59.   baseline Cr 3.5 -4.0.  past meds include losartan and HCTZ.  He was seen by [facility] inpatient in July for AKI felt 2/2 ATN from sustained pre-renal insults.  He was last seen in hospital follow-up in July by Dr. Joelyn [name].  He was assessed as having CKD stage IV and a high risk of progression to ESRD; he was felt to only be a candidate for in-center hemo when dialysis was needed.  He was  lost to follow-up.  He does not have dialysis access. He lives in a motel.  Would want dialysis if needed but hopes it hasn't come to that.  We discussed things didn't look good.  He did have some relief from the foley he states.  Strict ins/outs are not available.     Intake/Output Summary (Last 24 hours) at 03/12/2022 1120 Last data filed at 03/12/2022 0908 Gross per 24 hour  Intake 100 ml  Output 900 ml  Net -800 ml    Vitals:  Vitals:   03/12/22 0900 03/12/22 0905 03/12/22 0910 03/12/22 0915  BP: 124/67 112/60 112/64 109/60  Pulse: 79 78 78 79  Resp: 18 15 16 20   Temp:      TempSrc:      SpO2: 100% 100% 99% 100%  Weight:         Physical Exam:  General: adult male in bed in NAD HEENT: NCAT  Eyes: EOMI sclera anicteric Neck: supple trachea midline  Heart: S1S2 no rub Lungs: clear but reduced; normal work of breathing on room air  Abdomen: soft/obese/nt Extremities: bilateral BKA's Skin: no rash on extremities exposed  Neuro: alert and oriented x 3 provides hx and follows commands Psych normal mood and affect GU foley in place    Medications reviewed   Labs:     Latest Ref  Rng & Units 03/12/2022    3:23 AM 03/11/2022    3:08 AM 03/10/2022    3:16 PM  BMP  Glucose 70 - 99 mg/dL 106  114    BUN 6 - 20 mg/dL 93  89    Creatinine 0.61 - 1.24 mg/dL 12.91  13.34    Sodium 135 - 145 mmol/L 135  132    Potassium 3.5 - 5.1 mmol/L 3.5  3.7  2.7  C  Chloride 98 - 111 mmol/L 104  103    CO2 22 - 32 mmol/L 13  11    Calcium 8.9 - 10.3 mg/dL 6.2  5.9      C Corrected result     Assessment/Plan:   # AKI  - progression to ESRD. No hydro  - Continue foley  - He states now that he is agreeable to start HD-  s/p TDC thru IR and for first HD today  get [facility] involved and CLIP for OP HD  - Note that He is felt to be only a candidate for in-center HD per my assessment and his primary nephrologist - he is currently living in a motel     #  CKD stage IV  - Secondary to DM  - Follows with Dr. Joelyn [name] at [facility] but was lost to follow-up   #  HTN  Controlled-  no meds-  does not seem volume overloaded    # BPH  - continue finasteride - continue foley - he had relief with the foley - started flomax   # Hyponatremia - Mild and setting of AKI, dehydration - improving    # Hypokalemia - potassium is better-  high K bath      # Anemia CKD  - No acute indication for PRBC's -  now in the 9's-  iron OK and give ESA as well    # HIV  - has had for over 20 years per charting   Bones-   PTH pending -  phos 7.7-  will add phoslo    Keith [name], Keith [name] 03/12/2022 11:20 AM

## 2022-03-12 NOTE — Progress Notes (Addendum)
Date and time results received: 03/12/22 4:18 AM   Test: Ca Critical Value: 6.2     Name of Provider Notified: Quincy Sheehan MD  Orders Received? Or Actions Taken?: No new orders

## 2022-03-12 NOTE — Procedures (Signed)
Interventional Radiology Procedure Note  Procedure:  1) Central venogram 2) Tunneled hemodialysis catheter placement  Findings: Please refer to procedural dictation for full description. Right IJ, 23 cm tunneled HD placed.  Patent right IJ and SVC.  Complications: None immediate  Estimated Blood Loss: < 5 mL  Recommendations: Catheter ready for immediate use.   Ruthann Cancer, MD

## 2022-03-12 NOTE — Progress Notes (Signed)
Spoke to pt and pt's brother, Christia Reading, via phone this morning. Introduced self and explained role. Pt resides here in [place] but states he hopes to be moving in the future (did not mention area moving to). Pt prefers a clinic close to current residence. Explained that if pt needs a closer clinic to new address when he moves then his clinic can investigate a move to a closer clinic if needed. Pt voiced understanding. Referral submitted to [facility] admissions this morning for review. Pt states he drives but that he also uses transportation to/from MD appts and will likely use transportation to/from HD at d/c. Pt's brother inquiring about possible HH RN at d/c. Will notify [facility] staff of brothers inquiry for d/c needs. Will assist as needed.   Melven Sartorius Renal Navigator [phone]

## 2022-03-12 NOTE — Care Management Important Message (Signed)
Important Message  Patient Details  Name: Keith [name] MRN: [id number] Date of Birth: 1966-01-09   Medicare Important Message Given:  Yes     Shelda Altes 03/12/2022, 9:34 AM

## 2022-03-12 NOTE — Progress Notes (Signed)
PROGRESS NOTE    Keith [name]  FUX:[id number] DOB: 1966/03/12 DOA: 03/09/2022 [name], NP    Brief Narrative:  56 year old with history of coronary artery disease, CKD stage IV, HIV and type 2 diabetes on insulin along with multiple medical issues including both leg amputation presented with generalized weakness and diarrhea.  Found to have acute kidney injury, Salmonella and EPEC infections.  Admitted with nephrology consultation.   Assessment & Plan:   Acute kidney injury in a patient with CKD stage IV: Recent known creatinine of 3.7-3.8. Presented with acute kidney injury likely secondary to diarrheal illness, on hydrochlorothiazide and ARB. Nephrology following.  Foley catheter was placed.  Given his severe electrolyte abnormalities, anion gap metabolic acidosis, patient was given a permacath and is started on hemodialysis today.  Acute gastroenteritis with Salmonella and EPEC: Ciprofloxacin, renally dosed for 5 days as he is immunocompromised with CD4 count 129, HIV less than 20.  ID to follow.  HIV disease: Patient on Dovato/ Doravirine that is on hold in the setting of acute kidney injury. ID consulted given HIV and Salmonella.  Hypokalemia: Adequately replaced.  Hypocalcemia: We will defer to nephrology service for replacement.  Hypertension: Blood pressures low normal.  On maintenance IV fluids.  Antihypertensives on hold.  Depression and anxiety: On reduced dose of Lyrica, Lexapro continued.  Type 2 diabetes: On insulin at home.  Poor oral intake.  Currently on sliding scale insulin.  BPH: On finasteride and Flomax.  DVT prophylaxis: SCDs Start: 03/09/22 0845   Code Status: Full code.  Family Communication: None at the bedside Disposition Plan: Status is: Inpatient Remains inpatient appropriate because: Significant abnormal kidney function test.     Consultants:  Nephrology Palliative care  Procedures:  None  Antimicrobials:  Ciprofloxacin  500 mg p.o. daily 12/3---   Subjective:  Patient seen and examined.  Came back from procedure.  Was hungry and asking for breakfast.  Denies any complaints.  Agreeable for hemodialysis.  He tells me "I have no other choices" no more diarrhea as per patient.  Objective: Vitals:   03/12/22 0900 03/12/22 0905 03/12/22 0910 03/12/22 0915  BP: 124/67 112/60 112/64 109/60  Pulse: 79 78 78 79  Resp: _0 Temp:      TempSrc:      SpO2: 100% 100% 99% 100%  Weight:        Intake/Output Summary (Last 24 hours) at 03/12/2022 1355 Last data filed at 03/12/2022 0908 Gross per 24 hour  Intake 100 ml  Output 900 ml  Net -800 ml   Filed Weights   03/11/22 1400  Weight: 117.8 kg    Examination:  General exam: Chronically sick looking.  Not in any distress.  Alert awake and oriented.  Interactive. Respiratory system: Clear to auscultation. Respiratory effort normal. Cardiovascular system: S1 & S2 heard, RRR. No pedal edema.  Right below-knee amputation.  Left above-knee amputation.  Stumps are clear. Gastrointestinal system: Abdomen is nondistended, soft and nontender. No organomegaly or masses felt. Normal bowel sounds heard.  Obese and pendulous. Central nervous system: Alert and oriented. No focal neurological deficits.  Generalized weakness.    Data Reviewed: I have personally reviewed following labs and imaging studies  CBC: Recent Labs  Lab 03/09/22 0115 03/09/22 0857 03/10/22 0121 03/11/22 0308 03/12/22 0323  WBC 10.0 8.6 6.6 7.2 8.0  NEUTROABS  --  6.1 4.7 4.7 5.1  HGB 11.2* 10.4* 10.4* 9.6* 9.5*  HCT 35.4* 32.6* 32.2* 29.8* 29.0*  MCV 86.3 84.5 83.2 83.5 81.9  PLT 290 271 283 273 520   Basic Metabolic Panel: Recent Labs  Lab 03/09/22 0115 03/09/22 0839 03/09/22 1907 03/10/22 0121 03/10/22 0942 03/10/22 1516 03/11/22 0308 03/12/22 0323  NA 129*  --  131* 131*  --   --  132* 135  K 3.2*  --  2.7* 2.5* 2.6* 2.7* 3.7 3.5  CL 96*  --  99 100  --   --  103  104  CO2 13*  --  14* 11*  --   --  11* 13*  GLUCOSE 146*  --  165* 142*  --   --  114* 106*  BUN 94*  --  91* 88*  --   --  89* 93*  CREATININE 12.59*  --  12.50* 12.88*  --   --  13.34* 12.91*  CALCIUM 6.9*  --  6.2* 6.2*  --   --  5.9* 6.2*  MG  --  2.2  --  2.0  --   --   --   --   PHOS  --   --  8.4* 8.5*  --   --  8.7* 7.7*   GFR: Estimated Creatinine Clearance: 8.1 mL/min (A) (by C-G formula based on SCr of 12.91 mg/dL (H)). Liver Function Tests: Recent Labs  Lab 03/09/22 0115 03/09/22 1907 03/10/22 0121 03/11/22 0308 03/12/22 0323  AST 33  --   --   --   --   ALT 23  --   --   --   --   ALKPHOS 65  --   --   --   --   BILITOT 0.6  --   --   --   --   PROT 8.7*  --   --   --   --   ALBUMIN 2.7* 2.3* 2.1* 2.1* 2.2*   Recent Labs  Lab 03/09/22 0115  LIPASE 44   No results for input(s): "AMMONIA" in the last 168 hours. Coagulation Profile: No results for input(s): "INR", "PROTIME" in the last 168 hours. Cardiac Enzymes: No results for input(s): "CKTOTAL", "CKMB", "CKMBINDEX", "TROPONINI" in the last 168 hours. BNP (last 3 results) No results for input(s): "PROBNP" in the last 8760 hours. HbA1C: No results for input(s): "HGBA1C" in the last 72 hours. CBG: Recent Labs  Lab 03/11/22 1221 03/11/22 1603 03/11/22 2106 03/12/22 0811 03/12/22 1240  GLUCAP 114* 122* 124* 101* 147*   Lipid Profile: No results for input(s): "CHOL", "HDL", "LDLCALC", "TRIG", "CHOLHDL", "LDLDIRECT" in the last 72 hours. Thyroid Function Tests: No results for input(s): "TSH", "T4TOTAL", "FREET4", "T3FREE", "THYROIDAB" in the last 72 hours. Anemia Panel: Recent Labs    03/12/22 0323  FERRITIN 657*  TIBC 157*  IRON 38*   Sepsis Labs: No results for input(s): "PROCALCITON", "LATICACIDVEN" in the last 168 hours.  Recent Results (from the past 240 hour(s))  Culture, blood (Routine X 2) w Reflex to ID Panel     Status: None (Preliminary result)   Collection Time: 03/09/22  4:58 PM    Specimen: BLOOD  Result Value Ref Range Status   Specimen Description   Final    BLOOD RIGHT ANTECUBITAL Performed at [facility] [address] [place], [place] [address]    Special Requests   Final    BOTTLES DRAWN AEROBIC AND ANAEROBIC Blood Culture adequate volume Performed at [facility] [address] [place], [place] [address]    Culture   Final  NO GROWTH 3 DAYS Performed at [facility], [place] [address], [place], [place] [address]    Report Status PENDING  Incomplete  Culture, blood (Routine X 2) w Reflex to ID Panel     Status: None (Preliminary result)   Collection Time: 03/09/22  5:05 PM   Specimen: BLOOD RIGHT HAND  Result Value Ref Range Status   Specimen Description   Final    BLOOD RIGHT HAND Performed at [facility], [place] [address], [place], [place] [address]    Special Requests   Final    BOTTLES DRAWN AEROBIC AND ANAEROBIC Blood Culture adequate volume Performed at [facility] [address], [place], [place] [address]    Culture   Final    NO GROWTH 3 DAYS Performed at [facility], [place] [address] [place], [place] [address]    Report Status PENDING  Incomplete  Gastrointestinal Panel by PCR , Stool     Status: Abnormal   Collection Time: 03/09/22  6:24 PM   Specimen: Stool  Result Value Ref Range Status   Campylobacter species NOT DETECTED NOT DETECTED Final   Plesimonas shigelloides NOT DETECTED NOT DETECTED Final   Salmonella species DETECTED (A) NOT DETECTED Final    Comment: RESULT CALLED TO, READ BACK BY AND VERIFIED WITH: C/GREGORY COOPER 03/10/22 1736 AMK    Yersinia enterocolitica NOT DETECTED NOT DETECTED Corrected    Comment: CORRECTED ON 12/04 AT 0912: PREVIOUSLY REPORTED AS NOT DETECTED RESULT CALLED TO, READ BACK BY AND VERIFIED WITH: C/GREGORY COOPER 03/10/22 1736 AMK   Vibrio species NOT DETECTED NOT DETECTED Final   Vibrio cholerae NOT DETECTED NOT  DETECTED Final   Enteroaggregative E coli (EAEC) NOT DETECTED NOT DETECTED Final   Enteropathogenic E coli (EPEC) DETECTED (A) NOT DETECTED Final    Comment: RESULT CALLED TO, READ BACK BY AND VERIFIED WITH: C/GREGORY COOPER 03/10/22 1736 AMK    Enterotoxigenic E coli (ETEC) NOT DETECTED NOT DETECTED Final   Shiga like toxin producing E coli (STEC) NOT DETECTED NOT DETECTED Final   Shigella/Enteroinvasive E coli (EIEC) NOT DETECTED NOT DETECTED Final   Cryptosporidium NOT DETECTED NOT DETECTED Final   Cyclospora cayetanensis NOT DETECTED NOT DETECTED Final   Entamoeba histolytica NOT DETECTED NOT DETECTED Final   Giardia lamblia NOT DETECTED NOT DETECTED Final   Adenovirus F40/41 NOT DETECTED NOT DETECTED Final   Astrovirus NOT DETECTED NOT DETECTED Final   Norovirus GI/GII NOT DETECTED NOT DETECTED Final   Rotavirus A NOT DETECTED NOT DETECTED Final   Sapovirus (I, II, IV, and V) NOT DETECTED NOT DETECTED Final    Comment: Performed at [facility], [place], [place] [address]  C Difficile Quick Screen w PCR reflex     Status: None   Collection Time: 03/10/22 12:11 PM   Specimen: STOOL  Result Value Ref Range Status   C Diff antigen NEGATIVE NEGATIVE Final   C Diff toxin NEGATIVE NEGATIVE Final   C Diff interpretation No C. difficile detected.  Final    Comment: Performed at [facility], [place] [address], [place],  [address]         Radiology Studies: IR US Guide Vasc Access Right  Result Date: 03/12/2022 INDICATION: 56 year old male with insert renal disease requiring central venous access for hemodialysis. EXAM: 1. Central venogram. 2. TUNNELED CENTRAL VENOUS HEMODIALYSIS CATHETER PLACEMENT WITH ULTRASOUND AND FLUOROSCOPIC GUIDANCE MEDICATIONS: Ancef 2 gm IV . The antibiotic was  given in an appropriate time interval prior to skin puncture. ANESTHESIA/SEDATION: Moderate (conscious) sedation was employed during this procedure. A total of Versed  1 mg and Fentanyl 25 mcg was administered intravenously. Moderate Sedation Time: 24 minutes. The patient's level of consciousness and vital signs were monitored continuously by radiology nursing throughout the procedure under my direct supervision. FLUOROSCOPY TIME:  Sixteen mGy CONTRAST:  10 mL Omnipaque 300, intravenous COMPLICATIONS: None immediate. PROCEDURE: Informed written consent was obtained from the patient after a discussion of the risks, benefits, and alternatives to treatment. Questions regarding the procedure were encouraged and answered. The right neck and chest were prepped with chlorhexidine in a sterile fashion, and a sterile drape was applied covering the operative field. Maximum barrier sterile technique with sterile gowns and gloves were used for the procedure. A timeout was performed prior to the initiation of the procedure. After creating a small venotomy incision, a 21 gauge micropuncture kit was utilized to access the internal jugular vein. Real-time ultrasound guidance was utilized for vascular access including the acquisition of a permanent ultrasound image documenting patency of the accessed vessel. The microwire was difficult to advance after punctures, therefore central venogram was performed through the needle which demonstrated patent right internal jugular vein and superior vena cava which drains briskly into the right atrium. A Rosen wire was advanced to the level of the IVC and the micropuncture sheath was exchanged for an 8 Fr dilator. A 14.5 French tunneled hemodialysis catheter measuring 23 cm from tip to cuff was tunneled in a retrograde fashion from the anterior chest wall to the venotomy incision. Serial dilation was then performed an a peel-away sheath was placed. The catheter was then placed through the peel-away sheath with the catheter tip ultimately positioned within the right atrium. Final catheter positioning was confirmed and documented with a spot radiographic image.  The catheter aspirates and flushes normally. The catheter was flushed with appropriate volume heparin dwells. The catheter exit site was secured with a 0-Silk retention suture. The venotomy incision was closed with Dermabond. Sterile dressings were applied. The patient tolerated the procedure well without immediate post procedural complication. IMPRESSION: 1. Successful placement of 23 cm tip to cuff tunneled hemodialysis catheter via the right internal jugular vein with catheter tip terminating within the right atrium. The catheter is ready for immediate use. 2. Central venogram demonstrates patent right central veins. Ruthann Cancer, MD [facility] Radiology Electronically Signed   By: Ruthann Cancer M.D.   On: 03/12/2022 09:57   IR Fluoro Guide CV Line Right  Result Date: 03/12/2022 INDICATION: 56 year old male with insert renal disease requiring central venous access for hemodialysis. EXAM: 1. Central venogram. 2. TUNNELED CENTRAL VENOUS HEMODIALYSIS CATHETER PLACEMENT WITH ULTRASOUND AND FLUOROSCOPIC GUIDANCE MEDICATIONS: Ancef 2 gm IV . The antibiotic was given in an appropriate time interval prior to skin puncture. ANESTHESIA/SEDATION: Moderate (conscious) sedation was employed during this procedure. A total of Versed 1 mg and Fentanyl 25 mcg was administered intravenously. Moderate Sedation Time: 24 minutes. The patient's level of consciousness and vital signs were monitored continuously by radiology nursing throughout the procedure under my direct supervision. FLUOROSCOPY TIME:  Sixteen mGy CONTRAST:  10 mL Omnipaque 300, intravenous COMPLICATIONS: None immediate. PROCEDURE: Informed written consent was obtained from the patient after a discussion of the risks, benefits, and alternatives to treatment. Questions regarding the procedure were encouraged and answered. The right neck and chest were prepped with chlorhexidine in a sterile fashion, and a sterile  drape was applied covering the operative field. Maximum barrier sterile technique with sterile gowns and gloves were used for the procedure. A timeout was performed prior to the initiation of the procedure. After creating a small venotomy incision, a 21 gauge micropuncture kit was utilized to access the internal jugular vein. Real-time ultrasound guidance was utilized for vascular access including the acquisition of a permanent ultrasound image documenting patency of the accessed vessel. The microwire was difficult to advance after punctures, therefore central venogram was performed through the needle which demonstrated patent right internal jugular vein and superior vena cava which drains briskly into the right atrium. A Rosen wire was advanced to the level of the IVC and the micropuncture sheath was exchanged for an 8 Fr dilator. A 14.5 French tunneled hemodialysis catheter measuring 23 cm from tip to cuff was tunneled in a retrograde fashion from the anterior chest wall to the venotomy incision. Serial dilation was then performed an a peel-away sheath was placed. The catheter was then placed through the peel-away sheath with the catheter tip ultimately positioned within the right atrium. Final catheter positioning was confirmed and documented with a spot radiographic image. The catheter aspirates and flushes normally. The catheter was flushed with appropriate volume heparin dwells. The catheter exit site was secured with a 0-Silk retention suture. The venotomy incision was closed with Dermabond. Sterile dressings were applied. The patient tolerated the procedure well without immediate post procedural complication. IMPRESSION: 1. Successful placement of 23 cm tip to cuff tunneled hemodialysis catheter via the right internal jugular vein with catheter tip terminating within the right atrium. The catheter is ready for immediate use. 2. Central venogram demonstrates patent right central veins. Ruthann Cancer, MD  [facility] Radiology Electronically Signed   By: Ruthann Cancer M.D.   On: 03/12/2022 09:57   VAS [place] UPPER EXT VEIN MAPPING (PRE-OP AVF)  Result Date: 03/11/2022 UPPER EXTREMITY VEIN MAPPING Patient Name:  Keith [name]  Date of Exam:   03/11/2022 Medical Rec #: [id number]       Accession #:    [id number] Date of Birth: 1965/06/02       Patient Gender: M Patient Age:   55 years Exam Location:  [facility] Procedure:      VAS [place] UPPER EXT VEIN MAPPING (PRE-OP AVF) Referring Phys: Corliss Parish --------------------------------------------------------------------------------  Indications: Pre-access. Limitations: patient positioning Comparison Study: No prior studies. Performing Technologist: Oliver Hum RVT  Examination Guidelines: A complete evaluation includes B-mode imaging, spectral Doppler, color Doppler, and power Doppler as needed of all accessible portions of each vessel. Bilateral testing is considered an integral part of a complete examination. Limited examinations for reoccurring indications may be performed as noted. +-----------------+-------------+----------+--------------+ Right Cephalic   Diameter (cm)Depth (cm)   Findings    +-----------------+-------------+----------+--------------+ Shoulder             0.43        2.26                  +-----------------+-------------+----------+--------------+ Prox upper arm       0.35        0.86                  +-----------------+-------------+----------+--------------+ Mid upper arm        0.40        0.63                  +-----------------+-------------+----------+--------------+ Dist upper arm  0.46        0.49     branching    +-----------------+-------------+----------+--------------+ Antecubital fossa    0.48        0.42                  +-----------------+-------------+----------+--------------+ Prox forearm                            not  visualized +-----------------+-------------+----------+--------------+ Mid forearm          0.34        0.74                  +-----------------+-------------+----------+--------------+ Dist forearm         0.35        0.41     branching    +-----------------+-------------+----------+--------------+ +-----------------+-------------+----------+---------+ Right Basilic    Diameter (cm)Depth (cm)Findings  +-----------------+-------------+----------+---------+ Shoulder             0.68        2.30             +-----------------+-------------+----------+---------+ Prox upper arm       0.50        2.19             +-----------------+-------------+----------+---------+ Mid upper arm        0.47        1.37   branching +-----------------+-------------+----------+---------+ Dist upper arm       0.55        1.06             +-----------------+-------------+----------+---------+ Antecubital fossa    0.55        0.82             +-----------------+-------------+----------+---------+ Prox forearm         0.43        0.32   branching +-----------------+-------------+----------+---------+ Mid forearm          0.35        0.25   branching +-----------------+-------------+----------+---------+ Distal forearm       0.34        0.25             +-----------------+-------------+----------+---------+ *See table(s) above for measurements and observations.  Diagnosing physician: Servando Snare MD Electronically signed by Servando Snare MD on 03/11/2022 at 4:37:46 PM.    Final         Scheduled Meds:  aspirin EC  81 mg Oral Daily   calcium acetate  1,334 mg Oral TID WC   Chlorhexidine Gluconate Cloth  6 each Topical Daily   Chlorhexidine Gluconate Cloth  6 each Topical Q0600   clopidogrel  75 mg Oral Daily   darbepoetin (ARANESP) injection - NON-DIALYSIS  60 mcg Subcutaneous Q Mon-1800   escitalopram  20 mg Oral Daily   finasteride  5 mg Oral Daily   insulin aspart  0-9  Units Subcutaneous TID WC   pregabalin  50 mg Oral BID   tamsulosin  0.4 mg Oral QPC supper   Continuous Infusions:  anticoagulant sodium citrate       LOS: 3 days    Time spent: 35 minutes    Barb Merino, MD [facility] Pager [phone]

## 2022-03-12 NOTE — Progress Notes (Signed)
Notified Tori in HD that patient has returned from IR with HD catheter in and ready to use.

## 2022-03-13 ENCOUNTER — Inpatient Hospital Stay (HOSPITAL_COMMUNITY): Payer: Medicare Other

## 2022-03-13 ENCOUNTER — Other Ambulatory Visit (HOSPITAL_COMMUNITY): Payer: Self-pay

## 2022-03-13 DIAGNOSIS — I25118 Atherosclerotic heart disease of native coronary artery with other forms of angina pectoris: Secondary | ICD-10-CM

## 2022-03-13 DIAGNOSIS — A09 Infectious gastroenteritis and colitis, unspecified: Secondary | ICD-10-CM

## 2022-03-13 DIAGNOSIS — N186 End stage renal disease: Secondary | ICD-10-CM

## 2022-03-13 DIAGNOSIS — N400 Enlarged prostate without lower urinary tract symptoms: Secondary | ICD-10-CM | POA: Diagnosis not present

## 2022-03-13 DIAGNOSIS — R197 Diarrhea, unspecified: Secondary | ICD-10-CM | POA: Diagnosis not present

## 2022-03-13 DIAGNOSIS — B20 Human immunodeficiency virus [HIV] disease: Secondary | ICD-10-CM | POA: Diagnosis not present

## 2022-03-13 DIAGNOSIS — N184 Chronic kidney disease, stage 4 (severe): Secondary | ICD-10-CM | POA: Diagnosis not present

## 2022-03-13 DIAGNOSIS — N179 Acute kidney failure, unspecified: Secondary | ICD-10-CM | POA: Diagnosis not present

## 2022-03-13 LAB — PTH, INTACT AND CALCIUM
Calcium, Total (PTH): 6 mg/dL (ref 8.7–10.2)
PTH: 225 pg/mL — ABNORMAL HIGH (ref 15–65)

## 2022-03-13 LAB — RENAL FUNCTION PANEL
Albumin: 2.1 g/dL — ABNORMAL LOW (ref 3.5–5.0)
Anion gap: 17 — ABNORMAL HIGH (ref 5–15)
BUN: 67 mg/dL — ABNORMAL HIGH (ref 6–20)
CO2: 19 mmol/L — ABNORMAL LOW (ref 22–32)
Calcium: 6.9 mg/dL — ABNORMAL LOW (ref 8.9–10.3)
Chloride: 98 mmol/L (ref 98–111)
Creatinine, Ser: 9.7 mg/dL — ABNORMAL HIGH (ref 0.61–1.24)
GFR, Estimated: 6 mL/min — ABNORMAL LOW (ref >60)
Glucose, Bld: 221 mg/dL — ABNORMAL HIGH (ref 70–99)
Phosphorus: 5 mg/dL — ABNORMAL HIGH (ref 2.5–4.6)
Potassium: 3 mmol/L — ABNORMAL LOW (ref 3.5–5.1)
Sodium: 134 mmol/L — ABNORMAL LOW (ref 135–145)

## 2022-03-13 LAB — GLUCOSE, CAPILLARY
Glucose-Capillary: 177 mg/dL — ABNORMAL HIGH (ref 70–99)
Glucose-Capillary: 163 mg/dL — ABNORMAL HIGH (ref 70–99)
Glucose-Capillary: 138 mg/dL — ABNORMAL HIGH (ref 70–99)
Glucose-Capillary: 158 mg/dL — ABNORMAL HIGH (ref 70–99)

## 2022-03-13 MED ORDER — BICTEGRAVIR-EMTRICITAB-TENOFOV 50-200-25 MG PO TABS
1.0000 | ORAL_TABLET | Freq: Every day | ORAL | Status: DC
  Administered 2022-03-13: 1 via ORAL
  Filled 2022-03-13 (×2): qty 1

## 2022-03-13 MED ORDER — CHLORHEXIDINE GLUCONATE CLOTH 2 % EX PADS: 6.0000 | MEDICATED_PAD | Freq: Every day | CUTANEOUS | Status: DC

## 2022-03-13 MED ORDER — CEFAZOLIN SODIUM-DEXTROSE 2-4 GM/100ML-% IV SOLN
2.0000 g | INTRAVENOUS | Status: AC
  Administered 2022-03-14: 2 g via INTRAVENOUS
  Filled 2022-03-13 (×2): qty 100

## 2022-03-13 MED ORDER — ATORVASTATIN CALCIUM 80 MG PO TABS
80.0000 mg | ORAL_TABLET | Freq: Every day | ORAL | Status: DC
  Administered 2022-03-13 – 2022-03-18 (×6): 80 mg via ORAL
  Filled 2022-03-13 (×6): qty 1

## 2022-03-13 NOTE — Progress Notes (Signed)
New Dialysis Start   Patient identified as new dialysis start. Kidney Education packet assembled and given. Discussed the following items with patient:    Current medications and possible changes once started:  Discussed that patient's medications may change over time.  Ex; hypertension medications and diabetes medication.  Nephrologists will adjust as needed.  Fluid restrictions reviewed:  32 oz daily goal:  All liquids count; soups, ice, jello   Phosphorus and potassium: Handout given showing high potassium and phosphorus foods.  Alternative food and drink options given.  Family support:  no family present  Outpatient Clinic Resources:  Discussed roles of Outpatient clinic  staff and advised to make a list of needs, if any, to talk with outpatient staff if needed  Care plan schedule: Informed patient  of Care Plans in outpatient setting and to participate in the care plan.  An invitation would be given from outpatient clinic.   Dialysis Access Options:  Reviewed access options with patients. Discussed in detail about care at home with new AVG & AVF. Reviewed checking bruit and thrill. If dialysis catheter present, educated that patient could not take showers.  Catheter dressing changes were to be done by outpatient clinic staff only  Home therapy options:  Educated patient about home therapy options:  PD vs home hemo.     Patient verbalized understanding. Will continue to round on patient during admission.    Aldair Rickel W Jyll Tomaro, RN   

## 2022-03-13 NOTE — Progress Notes (Signed)
[facility] Progress Note   Subjective:  S/p TDC and first HD treatment yesterday -  tolerated well-  labs reflective of HD- appreciate VVS to see for perm access.  He says he feels good today -  is asking about transplant    56 y.o. male with HIV, CAD with hx STEMI, CKD stage IV, DM type 2, HTN, and prior CVA who presented  with generalized weakness and watery, non-bloody diarrhea.  He was found to have creatinine markedly elevated at 12.59.   baseline Cr 3.5 -4.0.  past meds include losartan and HCTZ.  He was seen by [facility] inpatient in July for AKI felt 2/2 ATN from sustained pre-renal insults.  He was last seen in hospital follow-up in July by Dr. Joelyn Oms- assessed as having CKD stage IV and a high risk of progression to ESRD; felt to only be a candidate for in-center hemo when dialysis was needed.  He was  lost to follow-up.   He lives in a motel.  Would want dialysis if needed   Intake/Output Summary (Last 24 hours) at 03/13/2022 1057 Last data filed at 03/13/2022 0600 Gross per 24 hour  Intake 400 ml  Output 651.1 ml  Net -251.1 ml    Vitals:  Vitals:   03/12/22 1700 03/12/22 2108 03/13/22 0552 03/13/22 0749  BP: (!) 116/100 128/81 130/82 127/81  Pulse: 77 78 95 69  Resp: 15  18 18   Temp: 97.9 F (36.6 C) 98.8 F (37.1 C) 99.5 F (37.5 C) 98.8 F (37.1 C)  TempSrc: Axillary Oral Oral Axillary  SpO2: 95%  100% 95%  Weight:         Physical Exam:  General: adult male in bed in NAD HEENT: NCAT  Eyes: EOMI sclera anicteric Neck: supple trachea midline  Heart: S1S2 no rub Lungs: clear but reduced; normal work of breathing on room air  Abdomen: soft/obese/nt Extremities: bilateral BKA's Skin: no rash on extremities exposed  Neuro: alert and oriented x 3 provides hx and follows commands Psych normal mood and affect GU foley in place    Medications reviewed   Labs:     Latest Ref Rng & Units 03/13/2022    2:19 AM 03/12/2022    3:23 AM 03/11/2022    3:08  AM  BMP  Glucose 70 - 99 mg/dL 221  106  114   BUN 6 - 20 mg/dL 67  93  89   Creatinine 0.61 - 1.24 mg/dL 9.70  12.91  13.34   Sodium 135 - 145 mmol/L 134  135  132   Potassium 3.5 - 5.1 mmol/L 3.0  3.5  3.7   Chloride 98 - 111 mmol/L 98  104  103   CO2 22 - 32 mmol/L 19  13  11    Calcium 8.9 - 10.3 mg/dL 6.9  6.2  5.9      Assessment/Plan:   # AKI  - progression to ESRD.   - He  is agreeable to start HD-  s/p TDC thru IR and for first HD 12/5  get VVS involved and CLIP for OP HD-  plan for second HD on 12/7-  doing well considering   - Note that He is felt to be only a candidate for in-center HD - he is currently living in a motel . He asks about transplant-  these discussions will be ongoing-  not sure his social support with pass the test   # CKD stage IV  - Secondary to DM  -  Follows with Dr. Joelyn [facility]ms at [facility] but was lost to follow-up   #  HTN  Controlled-  no meds-  does not seem too volume overloaded    # BPH  - continue finasteride -  he had relief with the foley - started flomax-  will need to attempt to remove and do voiding trial   # Hyponatremia - Mild and setting of AKI, dehydration - improving    # Hypokalemia - potassium is better-  high K bath      # Anemia CKD  - No acute indication for PRBC's -  now in the 9's-  iron OK -  giving ESA    # HIV  - has had for over 20 years per charting   Bones-   PTH pending -  phos 7.7-  added phoslo   Dispo-  would like perm access and will need HD placement prior to discharge    Louis Meckel, MD 03/13/2022 10:57 AM

## 2022-03-13 NOTE — Progress Notes (Signed)
Awaiting final acceptance and schedule/clinic from [facility]. May have determination by the end of today.   Melven Sartorius Renal Navigator [phone]

## 2022-03-13 NOTE — Progress Notes (Addendum)
[facility] PROGRESS NOTE  Keith [name] (DOB: 14-Aug-1965) GBT:[id number] [name], NP  Brief Narrative: Keith [name] is a 56 y.o. male with a history of stage IV CKD, HIV disease, IDT2DM, PVD s/p bilateral LE amputations, BPH who presented to the ED on 03/09/2022 with weakness and diarrhea found to have Salmonella and EPEC in stool studies. He was treated supportively, though suffered ARF for which nephrology was consulted and hemodialysis is initiated.   Subjective: Feeling a bit better, less weak, no pain or new complaints. Amenable to [place] rectal tube. Saw this morning and again this afternoon with mother present.  Objective: BP 113/73 (BP Location: Right Wrist)   Pulse 71   Temp 98.6 F (37 C) (Oral)   Resp 16   Wt 118.5 kg   SpO2 94%   BMI 37.48 kg/m   Gen: No distress Pulm: Clear, nonlabored  CV: RRR, no MRG, no edema GI: Soft, NT, ND, +BS, large/obese Neuro: Alert and oriented. No new focal deficits. Ext: Warm, BL LE amputation sites normal. Skin: Right IJ TDC minimally tender without erythema, induration, discharge. No other rashes, lesions or ulcers on visualized skin   Assessment & Plan: Salmonella gastroenteritis, EPEC colonization: Suspected per ID.  - Treated with cipro/flagyl x1 and then CTX x3 days. No further Tx needed per ID.  - Stool output monitoring, will DC rectal tube once output declines. - Enteric precautions  ARF on stage IV CKD, now ESRD:  - Continue HD per nephrology thru R IJ [facility] placed 12/5 by Dr. Serafina Royals. - Vein mapping for potential LUE AVF 12/7 per vascular - Outpatient HD spot pending. - Mother offers misgivings about adherence to dialysis regimen. We will encourage and provide every opportunity/resource available to facilitate his adherence.  BPH with acute urinary retention:  - Voiding trial - Continue tamsulosin, finasteride  HIV disease:  - Change ART to biktarvy, d/w ID, Dr. Linus Salmons now that patient is ESRD.  -  CD4 count is 129, VL undetectable - Follow up with [facility], Dr. Candiss Norse, arranged 1/8 at 10:00am.  IDT2DM:  - Continue SSI, will need to augment as po intake increases.   HTN:  - Can DC MIVF - Hold hydralazine, norvasc, coreg, HCTZ, losartan for now. BP still low-normal.  Depression, anxiety:  - Continue lexapro  Neuropathy:  - Continue reduced dose lyrica.   Hypokalemia:  - Tx per nephrology  Hypocalcemia:  - Tx per nephrology  PVD: s/p left AKA, right BKA.  - Follow up with [name] per routine  CAD: s/p DES 2021.  - Continue ASA, plavix, statin (LFTs ok)  Patrecia Pour, MD [facility] [email or web address] 03/13/2022, 2:17 PM

## 2022-03-13 NOTE — Progress Notes (Signed)
   VASCULAR SURGERY ASSESSMENT & PLAN:   END-STAGE RENAL DISEASE: The patient is scheduled for a left AV fistula tomorrow.  Vein map shows that he appears to have an inadequate left upper arm cephalic vein for a brachiocephalic fistula.  I have written for the nurses to remove the IV from the left arm in anticipation of surgery tomorrow.  RENAL: The patient is scheduled for surgery in the morning.  Please do not dialyze the patient in the morning.  Thank you.   SUBJECTIVE:    PHYSICAL EXAM:   Vitals:   03/13/22 0552 03/13/22 0749 03/13/22 1118 03/13/22 1658  BP: 130/82 127/81 113/73 112/71  Pulse: 95 69 71 68  Resp: 18 18 16 16   Temp: 99.5 F (37.5 C) 98.8 F (37.1 C) 98.6 F (37 C) (!) 97.5 F (36.4 C)  TempSrc: Oral Axillary Oral Axillary  SpO2: 100% 95% 94% 97%  Weight:        LABS:   Lab Results  Component Value Date   WBC 8.0 03/12/2022   HGB 9.5 (L) 03/12/2022   HCT 29.0 (L) 03/12/2022   MCV 81.9 03/12/2022   PLT 316 03/12/2022   Lab Results  Component Value Date   CREATININE 9.70 (H) 03/13/2022   Lab Results  Component Value Date   INR 1.0 04/25/2020   CBG (last 3)  Recent Labs    03/13/22 0751 03/13/22 1112 03/13/22 1658  GLUCAP 177* 163* 138*    PROBLEM LIST:    Principal Problem:   AKI (acute kidney injury) ([place]) Active Problems:   HTN (hypertension)   HIV disease (HCC)   Diarrhea   CKD (chronic kidney disease) stage 4, GFR 15-29 ml/min (HCC)   CAD (coronary artery disease)   BPH (benign prostatic hyperplasia)   GERD (gastroesophageal reflux disease)   Gallbladder anomaly   CURRENT MEDS:    aspirin EC  81 mg Oral Daily   atorvastatin  80 mg Oral Daily   bictegravir-emtricitabine-tenofovir AF  1 tablet Oral Daily   calcium acetate  1,334 mg Oral TID WC   Chlorhexidine Gluconate Cloth  6 each Topical Daily   clopidogrel  75 mg Oral Daily   darbepoetin (ARANESP) injection - NON-DIALYSIS  60 mcg Subcutaneous Q Mon-1800    escitalopram  20 mg Oral Daily   finasteride  5 mg Oral Daily   insulin aspart  0-9 Units Subcutaneous TID WC   pregabalin  50 mg Oral BID   tamsulosin  0.4 mg Oral QPC supper    Deitra Mayo Office: [phone] 03/13/2022

## 2022-03-13 NOTE — Progress Notes (Signed)
Palliative Care Progress Note, Assessment & Plan   Patient Name: Keith [name]       Date: 03/13/2022 DOB: 02-08-1966  Age: 56 y.o. MRN#: [id number] Attending Physician: Patrecia Pour, MD Primary Care Physician: [name], NP Admit Date: 03/09/2022  Reason for Consultation/Follow-up: Establishing goals of care  Subjective: Patient is lying in bed, asleep, and in no apparent distress.  He is easily aroused, opens his eyes, grunts yes and no to answers appropriately, and quickly falls back to sleep.  His mother is at bedside.  HPI: 56 y.o. male  with past medical history of CAD, CKD (stage 4), HIV (Dovato and Pifeltro - intermittent compliance), DM2, bialteral leg amputation, BPH, and GERD admitted on 03/09/2022 with diarrhea.   Patient is being treated for AKI r/t diarrheal illness from salmonella and epec. Diarrhea has improved with treatment of ceftriaxone and cipro. Patient remains in AKI and has agreed to proceed with HD. Outpatient HD plans in progress with TOC following closely.   PMT was consulted in light of patient's significant illness and new need for dialysis.  Summary of counseling/coordination of care: After reviewing the patient's chart and assessing the patient at bedside, Dr. Bonner Puna and I spoke with patient's mother in regards to patient's current medical status and plan of care.  Medical update given by Dr. Bonner Puna.  We discussed patient's past adherence to medication regimen.  Reviewed patient's agency over decision making.  Assured mother that opportunities and resources will be made available for patient to utilize.  However, patient will need to adhere to plan of care and medication regimen in order to have most success.  Patient's mother inquires about getting extra assistance  for patient.  Reviewed that PT will need to evaluate patient and that home-going evaluations will help formulate a disposition and plan safe for the patient.  Therapeutic silence and active listening provided for [name] to share her thoughts and emotions regarding current medical situation.  Emotional support provided.  [name] shares that she never discussed advance care planning and that patient does not have any existing living will/HCPOA in place.  She is hopeful that he will become more awake and will be able to discussed only his need for medication adherence but what his wishes will be.  PMT will remain available to patient and continue to follow him throughout his hospitalization.  Ongoing discussions with family and patient is to continue.  Physical Exam Vitals reviewed.  Constitutional:      General: He is not in acute distress.    Appearance: He is obese. He is not ill-appearing.  HENT:     Head: Normocephalic.     Mouth/Throat:     Mouth: Mucous membranes are moist.  Eyes:     Pupils: Pupils are equal, round, and reactive to light.  Cardiovascular:     Rate and Rhythm: Normal rate.     Pulses: Normal pulses.  Pulmonary:     Effort: Pulmonary effort is normal.  Abdominal:     Palpations: Abdomen is soft.  Genitourinary:    Comments: Rectal tube in place Musculoskeletal:     Comments: Generalized weakness  Skin:    Comments: Bilateral LE amputations  Palliative Assessment/Data: 40%    Total Time 50 minutes  Greater than 50% of this time was spent counseling and coordinating care related to the above assessment and plan.  Thank you for allowing the Palliative Medicine Team to assist in the care of this patient.  [place] Ilsa Iha, FNP-BC Palliative Medicine Team Team Phone # [phone]

## 2022-03-13 NOTE — TOC Benefit Eligibility Note (Signed)
Patient Teacher, English as a foreign language completed.    The patient is currently admitted and upon discharge could be taking Biktarvy.  The current 30 day co-pay is $0.00.   The patient is insured through [employer], [employer] Patient Advocate Specialist [place] Patient Advocate Team Direct Number: [phone]  Fax: [phone]

## 2022-03-13 NOTE — TOC Initial Note (Signed)
Transition of Care [facility]) - Initial/Assessment Note    Patient Details  Name: Keith [name] MRN: [id number] Date of Birth: January 03, 1966  [facility] CM/SW Contact:    Bethena Roys, RN Phone Number: 03/13/2022, 4:20 PM  Clinical Narrative:  Risk for readmission assessment completed. Patient presented for general weakness- new start to HD- Clip in process. PTA patient was from a motel with support of his mother and brother. Case Manager received verbal permission to call the brother Brockton Mckesson at [phone]. Christia Reading feels that the patient is unable to care for himself at this time and wants him to go to a SNF at discharge. Case Manager discussed getting a PT/OT consult for recommendations- MD has placed orders. Case Manager will continue to follow for disposition needs.             Expected Discharge Plan:  (TBD) Barriers to Discharge: Continued Medical Work up   Patient Goals and CMS Choice Patient states their goals for this hospitalization and ongoing recovery are:: to get better      Expected Discharge Plan and Services Expected Discharge Plan:  (TBD) In-house Referral: Clinical Social Work Discharge Planning Services: CM Consult Post Acute Care Choice: [place] Living arrangements for the past 2 months: Hotel/Motel                   DME Agency: NA   Prior Living Arrangements/Services Living arrangements for the past 2 months: Hotel/Motel Lives with:: Self (support of mother and brother.) Patient language and need for interpreter reviewed:: Yes Do you feel safe going back to the place where you live?: Yes      Need for Family Participation in Patient Care: Yes (Comment) Care giver support system in place?: Yes (comment) Current home services: DME (rolling walker) Criminal Activity/Legal Involvement Pertinent to Current Situation/Hospitalization: No - Comment as needed  Activities of Daily Living Home Assistive  Devices/Equipment: None ADL Screening (condition at time of admission) Patient's cognitive ability adequate to safely complete daily activities?: Yes Is the patient deaf or have difficulty hearing?: No Does the patient have difficulty seeing, even when wearing glasses/contacts?: No Patient able to express need for assistance with ADLs?: No Does the patient have difficulty dressing or bathing?: No Independently performs ADLs?: Yes (appropriate for developmental age) Does the patient have difficulty walking or climbing stairs?: No Weakness of Legs: None Weakness of Arms/Hands: None  Permission Sought/Granted Permission sought to share information with : Family Supports, Case Manager   Emotional Assessment Appearance:: Appears stated age       Alcohol / Substance Use: Not Applicable Psych Involvement: No (comment)  Admission diagnosis:  Diarrhea of presumed infectious origin [R19.7] Malaise [R53.81] AKI (acute kidney injury) ([place]) [N17.9] Acute renal failure superimposed on chronic kidney disease, unspecified acute renal failure type, unspecified CKD stage ([place]) [N17.9, N18.9] Patient Active Problem List   Diagnosis Date Noted   AKI (acute kidney injury) ([place]) 03/09/2022   Gallbladder anomaly 03/09/2022   Fall at home, initial encounter 12/29/2021   Acute cystitis 12/29/2021   History of anemia due to chronic kidney disease 12/29/2021   Nonketotic hyperglycinemia, type II ([place]) 12/18/2021   Asymptomatic bacteriuria 12/18/2021   Type 2 diabetes mellitus with hyperosmolar nonketotic hyperglycemia ([place]) 12/06/2021   Hyperglycemia 12/06/2021   BPH (benign prostatic hyperplasia)    GERD (gastroesophageal reflux disease)    Headache 08/22/2021   Anxiety 02/13/2021   Healthcare maintenance 02/13/2021   History of left above knee amputation (  [place]) 02/07/2021   Wound dehiscence 01/03/2021   Coagulopathy ([place]) 12/22/2020   CAD (coronary artery disease) 12/22/2020   Obesity 12/22/2020    Ulcer of heel due to diabetes mellitus ([place])    Anemia    Hyperglycemia due to diabetes mellitus ([place]) 08/29/2020   Type 2 diabetes mellitus with hyperlipidemia (HCC)    PAD (peripheral artery disease) ([place])    Uncontrolled type 2 diabetes mellitus with hyperglycemia, with long-term current use of insulin with renal complication 68/03/7516   Elevated glucose 01/14/2019   Hx of BKA, right ([place]) 01/14/2019   Pressure injury of skin 11/11/2018   CKD (chronic kidney disease) stage 4, GFR 15-29 ml/min (HCC) 11/10/2018   Amputation stump infection ([place]) 10/28/2018   Type II diabetes mellitus with renal manifestations ([place]) 08/07/2018   Uncontrolled type 2 diabetes mellitus with hyperosmolar nonketotic hyperglycemia ([place]) 08/07/2018   Non-pressure chronic ulcer of right calf limited to breakdown of skin ([place]) 07/06/2018   Type 2 diabetes mellitus without complication, without long-term current use of insulin ([place]) 06/15/2018   Chronic low back pain without sciatica 06/15/2018   Idiopathic chronic venous hypertension of left lower extremity with ulcer and inflammation (HCC)    Venous stasis ulcer of left calf limited to breakdown of skin without varicose veins ([place]) 04/23/2018   Acquired absence of right leg below knee ([place]) 06/13/2017   Diabetic polyneuropathy associated with type 2 diabetes mellitus ([place]) 01/23/2017   Diarrhea 09/28/2016   Nausea vomiting and diarrhea 09/28/2016   Onychomycosis of multiple toenails with type 2 diabetes mellitus ([place]) 08/29/2015   MRSA carrier 04/20/2014   Penile wart 02/22/2014   HIV disease ([place])    Insulin-requiring or dependent type II diabetes mellitus ([place]) 02/04/2014   Dental anomaly 11/20/2012   Arthritis of right knee 02/23/2012   Hyperlipidemia 11/10/2011   Hyponatremia 11/10/2011   Chronic pain 08/07/2011   Meralgia paraesthetica 04/23/2011   ERECTILE DYSFUNCTION 08/22/2008   HTN (hypertension) 05/19/2008   PCP:  [name],  NP Pharmacy:   [id number] - [place], [place] [address] Phone: [phone] Fax: [place] [address] Phone: [phone] Fax: [phone]  [id number] - [place], [place] - [address] [place] [address] [place] [address] Phone: [phone] Fax: [phone]  Readmission Risk Interventions    03/13/2022    4:18 PM 01/01/2022   11:54 AM 10/10/2021    1:31 PM  Readmission Risk Prevention Plan  Transportation Screening Complete Complete Complete  Medication Review ([place]) Referral to Pharmacy Referral to Pharmacy Complete  PCP or Specialist appointment within 3-5 days of discharge  Complete Complete  HRI or Home Care Consult Complete Complete Complete  SW Recovery Care/Counseling Consult Complete Complete Complete  Palliative Care Screening Not Applicable Not Applicable Not [place]  Not Applicable Not Applicable

## 2022-03-14 ENCOUNTER — Encounter (HOSPITAL_COMMUNITY)
Admission: EM | Disposition: A | Discharge status: Skilled Nursing Facility | Payer: Self-pay | Source: Home / Self Care | Attending: Family Medicine

## 2022-03-14 ENCOUNTER — Inpatient Hospital Stay (HOSPITAL_COMMUNITY): Payer: Medicare Other | Admitting: Anesthesiology

## 2022-03-14 ENCOUNTER — Encounter (HOSPITAL_COMMUNITY): Payer: Self-pay | Admitting: Internal Medicine

## 2022-03-14 ENCOUNTER — Other Ambulatory Visit: Payer: Self-pay

## 2022-03-14 DIAGNOSIS — I2510 Atherosclerotic heart disease of native coronary artery without angina pectoris: Secondary | ICD-10-CM

## 2022-03-14 DIAGNOSIS — D631 Anemia in chronic kidney disease: Secondary | ICD-10-CM

## 2022-03-14 DIAGNOSIS — N186 End stage renal disease: Secondary | ICD-10-CM

## 2022-03-14 DIAGNOSIS — I25118 Atherosclerotic heart disease of native coronary artery with other forms of angina pectoris: Secondary | ICD-10-CM | POA: Diagnosis not present

## 2022-03-14 DIAGNOSIS — N189 Chronic kidney disease, unspecified: Secondary | ICD-10-CM

## 2022-03-14 DIAGNOSIS — N179 Acute kidney failure, unspecified: Secondary | ICD-10-CM | POA: Diagnosis not present

## 2022-03-14 DIAGNOSIS — E1122 Type 2 diabetes mellitus with diabetic chronic kidney disease: Secondary | ICD-10-CM

## 2022-03-14 DIAGNOSIS — N400 Enlarged prostate without lower urinary tract symptoms: Secondary | ICD-10-CM | POA: Diagnosis not present

## 2022-03-14 DIAGNOSIS — Z992 Dependence on renal dialysis: Secondary | ICD-10-CM

## 2022-03-14 DIAGNOSIS — I120 Hypertensive chronic kidney disease with stage 5 chronic kidney disease or end stage renal disease: Secondary | ICD-10-CM

## 2022-03-14 DIAGNOSIS — K219 Gastro-esophageal reflux disease without esophagitis: Secondary | ICD-10-CM

## 2022-03-14 DIAGNOSIS — I252 Old myocardial infarction: Secondary | ICD-10-CM

## 2022-03-14 DIAGNOSIS — A09 Infectious gastroenteritis and colitis, unspecified: Secondary | ICD-10-CM | POA: Diagnosis not present

## 2022-03-14 DIAGNOSIS — Z794 Long term (current) use of insulin: Secondary | ICD-10-CM

## 2022-03-14 HISTORY — PX: AV FISTULA PLACEMENT: SHX1204

## 2022-03-14 LAB — RENAL FUNCTION PANEL
Albumin: 2.2 g/dL — ABNORMAL LOW (ref 3.5–5.0)
Anion gap: 13 (ref 5–15)
BUN: 69 mg/dL — ABNORMAL HIGH (ref 6–20)
CO2: 17 mmol/L — ABNORMAL LOW (ref 22–32)
Calcium: 6.8 mg/dL — ABNORMAL LOW (ref 8.9–10.3)
Chloride: 104 mmol/L (ref 98–111)
Creatinine, Ser: 10.06 mg/dL — ABNORMAL HIGH (ref 0.61–1.24)
GFR, Estimated: 5 mL/min — ABNORMAL LOW (ref >60)
Glucose, Bld: 174 mg/dL — ABNORMAL HIGH (ref 70–99)
Phosphorus: 5.9 mg/dL — ABNORMAL HIGH (ref 2.5–4.6)
Potassium: 3.1 mmol/L — ABNORMAL LOW (ref 3.5–5.1)
Sodium: 134 mmol/L — ABNORMAL LOW (ref 135–145)

## 2022-03-14 LAB — CULTURE, BLOOD (ROUTINE X 2)
Culture: NO GROWTH
Culture: NO GROWTH
Special Requests: ADEQUATE
Special Requests: ADEQUATE

## 2022-03-14 LAB — GLUCOSE, CAPILLARY
Glucose-Capillary: 181 mg/dL — ABNORMAL HIGH (ref 70–99)
Glucose-Capillary: 182 mg/dL — ABNORMAL HIGH (ref 70–99)
Glucose-Capillary: 180 mg/dL — ABNORMAL HIGH (ref 70–99)
Glucose-Capillary: 511 mg/dL (ref 70–99)
Glucose-Capillary: 577 mg/dL (ref 70–99)
Glucose-Capillary: 224 mg/dL — ABNORMAL HIGH (ref 70–99)
Glucose-Capillary: 162 mg/dL — ABNORMAL HIGH (ref 70–99)

## 2022-03-14 LAB — CBC
HCT: 29.1 % — ABNORMAL LOW (ref 39.0–52.0)
Hemoglobin: 9.6 g/dL — ABNORMAL LOW (ref 13.0–17.0)
MCH: 26.8 pg (ref 26.0–34.0)
MCHC: 33 g/dL (ref 30.0–36.0)
MCV: 81.3 fL (ref 80.0–100.0)
Platelets: 334 10*3/uL (ref 150–400)
RBC: 3.58 MIL/uL — ABNORMAL LOW (ref 4.22–5.81)
RDW: 13 % (ref 11.5–15.5)
WBC: 6.6 10*3/uL (ref 4.0–10.5)
nRBC: 0 % (ref 0.0–0.2)

## 2022-03-14 SURGERY — ARTERIOVENOUS (AV) FISTULA CREATION
Anesthesia: Monitor Anesthesia Care | Site: Arm Upper | Laterality: Left

## 2022-03-14 MED ORDER — FENTANYL CITRATE (PF) 100 MCG/2ML IJ SOLN: 12.5000 ug | Freq: Once | INTRAMUSCULAR | Status: AC

## 2022-03-14 MED ORDER — INSULIN ASPART 100 UNIT/ML IJ SOLN: 0.0000 [IU] | INTRAMUSCULAR | Status: DC | PRN

## 2022-03-14 MED ORDER — PHENYLEPHRINE 80 MCG/ML (10ML) SYRINGE FOR IV PUSH (FOR BLOOD PRESSURE SUPPORT)
PREFILLED_SYRINGE | INTRAVENOUS | Status: DC | PRN
  Administered 2022-03-14 (×3): 80 ug via INTRAVENOUS

## 2022-03-14 MED ORDER — PROPOFOL 500 MG/50ML IV EMUL
INTRAVENOUS | Status: DC | PRN
  Administered 2022-03-14: 75 ug/kg/min via INTRAVENOUS

## 2022-03-14 MED ORDER — ORAL CARE MOUTH RINSE: 15.0000 mL | Freq: Once | OROMUCOSAL | Status: AC

## 2022-03-14 MED ORDER — MEPIVACAINE HCL (PF) 2 % IJ SOLN
INTRAMUSCULAR | Status: DC | PRN
  Administered 2022-03-14: 30 mL

## 2022-03-14 MED ORDER — FENTANYL CITRATE (PF) 100 MCG/2ML IJ SOLN
INTRAMUSCULAR | Status: AC
  Administered 2022-03-14: 12.5 ug via INTRAVENOUS
  Filled 2022-03-14: qty 2

## 2022-03-14 MED ORDER — INSULIN ASPART 100 UNIT/ML IJ SOLN
INTRAMUSCULAR | Status: AC
  Administered 2022-03-14: 4 [IU] via SUBCUTANEOUS
  Filled 2022-03-14: qty 1

## 2022-03-14 MED ORDER — ALBUMIN HUMAN 5 % IV SOLN: INTRAVENOUS | Status: DC | PRN

## 2022-03-14 MED ORDER — HEPARIN SODIUM (PORCINE) 1000 UNIT/ML IJ SOLN
INTRAMUSCULAR | Status: AC
  Filled 2022-03-14: qty 4

## 2022-03-14 MED ORDER — HEPARIN 6000 UNIT IRRIGATION SOLUTION
Status: AC
  Filled 2022-03-14: qty 500

## 2022-03-14 MED ORDER — HEPARIN 6000 UNIT IRRIGATION SOLUTION
Status: DC | PRN
  Administered 2022-03-14: 1

## 2022-03-14 MED ORDER — SODIUM CHLORIDE 0.9 % IV SOLN: INTRAVENOUS | Status: DC

## 2022-03-14 MED ORDER — HEPARIN SODIUM (PORCINE) 1000 UNIT/ML IJ SOLN
INTRAMUSCULAR | Status: DC | PRN
  Administered 2022-03-14: 2000 [IU] via INTRAVENOUS

## 2022-03-14 MED ORDER — MIDAZOLAM HCL 2 MG/2ML IJ SOLN
INTRAMUSCULAR | Status: AC
  Filled 2022-03-14: qty 2

## 2022-03-14 MED ORDER — FENTANYL CITRATE (PF) 100 MCG/2ML IJ SOLN: 25.0000 ug | INTRAMUSCULAR | Status: DC | PRN

## 2022-03-14 MED ORDER — 0.9 % SODIUM CHLORIDE (POUR BTL) OPTIME
TOPICAL | Status: DC | PRN
  Administered 2022-03-14: 1000 mL

## 2022-03-14 MED ORDER — INSULIN ASPART 100 UNIT/ML IJ SOLN
10.0000 [IU] | Freq: Once | INTRAMUSCULAR | Status: AC
  Administered 2022-03-14: 10 [IU] via SUBCUTANEOUS

## 2022-03-14 MED ORDER — PHENYLEPHRINE HCL-NACL 20-0.9 MG/250ML-% IV SOLN
INTRAVENOUS | Status: DC | PRN
  Administered 2022-03-14: 20 ug/min via INTRAVENOUS

## 2022-03-14 MED ORDER — CHLORHEXIDINE GLUCONATE 0.12 % MT SOLN
15.0000 mL | Freq: Once | OROMUCOSAL | Status: AC
  Administered 2022-03-14: 15 mL via OROMUCOSAL

## 2022-03-14 MED ORDER — HYDROCODONE-ACETAMINOPHEN 5-325 MG PO TABS: 1.0000 | ORAL_TABLET | ORAL | Status: DC | PRN

## 2022-03-14 MED ORDER — BICTEGRAVIR-EMTRICITAB-TENOFOV 50-200-25 MG PO TABS
1.0000 | ORAL_TABLET | Freq: Every day | ORAL | Status: DC
  Administered 2022-03-15 – 2022-03-18 (×4): 1 via ORAL
  Filled 2022-03-14 (×4): qty 1

## 2022-03-14 MED ORDER — HEPARIN SODIUM (PORCINE) 1000 UNIT/ML DIALYSIS: 20.0000 [IU]/kg | INTRAMUSCULAR | Status: DC | PRN

## 2022-03-14 MED ORDER — OXYCODONE HCL 5 MG PO TABS: 5.0000 mg | ORAL_TABLET | Freq: Once | ORAL | Status: DC | PRN

## 2022-03-14 MED ORDER — STERILE WATER FOR IRRIGATION IR SOLN
Status: DC | PRN
  Administered 2022-03-14: 1000 mL

## 2022-03-14 MED ORDER — OXYCODONE HCL 5 MG/5ML PO SOLN: 5.0000 mg | Freq: Once | ORAL | Status: DC | PRN

## 2022-03-14 MED ORDER — EPINEPHRINE PF 1 MG/ML IJ SOLN
INTRAMUSCULAR | Status: DC | PRN
  Administered 2022-03-14: .15 mg via SUBCUTANEOUS

## 2022-03-14 MED ORDER — FENTANYL CITRATE (PF) 100 MCG/2ML IJ SOLN
12.5000 ug | Freq: Once | INTRAMUSCULAR | Status: AC
  Administered 2022-03-14: 12.5 ug via INTRAVENOUS

## 2022-03-14 SURGICAL SUPPLY — 43 items
ADH SKN CLS APL DERMABOND .7 (GAUZE/BANDAGES/DRESSINGS) ×1
ARMBAND PINK RESTRICT EXTREMIT (MISCELLANEOUS) ×1 IMPLANT
BAG COUNTER SPONGE SURGICOUNT (BAG) ×1 IMPLANT
BAG SPNG CNTER NS LX DISP (BAG) ×1
BLADE CLIPPER SURG (BLADE) ×1 IMPLANT
BNDG ELASTIC 4X5.8 VLCR STR LF (GAUZE/BANDAGES/DRESSINGS) ×1 IMPLANT
CANISTER SUCT 3000ML PPV (MISCELLANEOUS) ×1 IMPLANT
CLIP LIGATING EXTRA MED SLVR (CLIP) ×1 IMPLANT
CLIP LIGATING EXTRA SM BLUE (MISCELLANEOUS) ×1 IMPLANT
CLIP VESOCCLUDE MED 6/CT (CLIP) ×1 IMPLANT
COVER PROBE W GEL 5X96 (DRAPES) ×1 IMPLANT
DERMABOND ADVANCED .7 DNX12 (GAUZE/BANDAGES/DRESSINGS) ×1 IMPLANT
ELECT REM PT RETURN 9FT ADLT (ELECTROSURGICAL) ×1
ELECTRODE REM PT RTRN 9FT ADLT (ELECTROSURGICAL) ×1 IMPLANT
GLOVE BIO SURGEON STRL SZ7.5 (GLOVE) ×1 IMPLANT
GLOVE BIOGEL PI IND STRL 7.5 (GLOVE) IMPLANT
GLOVE BIOGEL PI IND STRL 8 (GLOVE) ×1 IMPLANT
GLOVE SRG 8 PF TXTR STRL LF DI (GLOVE) ×1 IMPLANT
GLOVE SURG ENC MOIS LTX SZ7 (GLOVE) IMPLANT
GLOVE SURG POLYISO LF SZ8 (GLOVE) IMPLANT
GLOVE SURG UNDER POLY LF SZ8 (GLOVE) ×1
GOWN STRL REUS W/ TWL LRG LVL3 (GOWN DISPOSABLE) ×2 IMPLANT
GOWN STRL REUS W/TWL 2XL LVL3 (GOWN DISPOSABLE) ×2 IMPLANT
GOWN STRL REUS W/TWL LRG LVL3 (GOWN DISPOSABLE) ×2
KIT BASIN OR (CUSTOM PROCEDURE TRAY) ×1 IMPLANT
KIT TURNOVER KIT B (KITS) ×1 IMPLANT
LOOP VESSEL MINI RED (MISCELLANEOUS) IMPLANT
NS IRRIG 1000ML POUR BTL (IV SOLUTION) ×1 IMPLANT
PACK CV ACCESS (CUSTOM PROCEDURE TRAY) ×1 IMPLANT
PAD ARMBOARD 7.5X6 YLW CONV (MISCELLANEOUS) ×2 IMPLANT
SLING ARM FOAM STRAP LRG (SOFTGOODS) IMPLANT
SLING ARM FOAM STRAP MED (SOFTGOODS) IMPLANT
SPIKE FLUID TRANSFER (MISCELLANEOUS) ×1 IMPLANT
SUT MNCRL AB 4-0 PS2 18 (SUTURE) ×1 IMPLANT
SUT PROLENE 6 0 BV (SUTURE) ×1 IMPLANT
SUT PROLENE 7 0 BV 1 (SUTURE) IMPLANT
SUT SILK 2 0 SH (SUTURE) ×1 IMPLANT
SUT SILK 3 0 SH CR/8 (SUTURE) ×1 IMPLANT
SUT VIC AB 3-0 SH 27 (SUTURE) ×1
SUT VIC AB 3-0 SH 27X BRD (SUTURE) ×1 IMPLANT
TOWEL GREEN STERILE (TOWEL DISPOSABLE) ×1 IMPLANT
UNDERPAD 30X36 HEAVY ABSORB (UNDERPADS AND DIAPERS) ×1 IMPLANT
WATER STERILE IRR 1000ML POUR (IV SOLUTION) ×1 IMPLANT

## 2022-03-14 NOTE — Progress Notes (Signed)
OT Cancellation Note  Patient Details Name: Keith [name] MRN: [id number] DOB: [birth year]   Cancelled Treatment:    Reason Eval/Treat Not Completed: Patient at procedure or test/ unavailable (Pt off floor for surgery, will re attempt OT eval as schedule allows.)  Almyra Deforest, OTR/L 03/14/2022, 9:14 AM

## 2022-03-14 NOTE — Progress Notes (Signed)
[facility] Progress Note   Subjective:  S/p AVF this AM-  also due for his second HD today -  a little confused at present     56 y.o. male with HIV, CAD with hx STEMI, CKD stage IV, DM type 2, HTN, and prior CVA who presented  with generalized weakness and watery, non-bloody diarrhea.  He was found to have creatinine markedly elevated at 12.59.   baseline Cr 3.5 -4.0.  past meds include losartan and HCTZ.  He was seen by [facility] inpatient in July for AKI felt 2/2 ATN from sustained pre-renal insults.  He was last seen in hospital follow-up in July by Dr. Joelyn Oms- assessed as having CKD stage IV and a high risk of progression to ESRD; felt to only be a candidate for in-center hemo when dialysis was needed.  He was  lost to follow-up.   He lives in a motel.  Would want dialysis if needed    Intake/Output Summary (Last 24 hours) at 03/14/2022 1155 Last data filed at 03/14/2022 1100 Gross per 24 hour  Intake 550 ml  Output 345 ml  Net 205 ml    Vitals:  Vitals:   03/14/22 1120 03/14/22 1130 03/14/22 1135 03/14/22 1149  BP: (!) 97/57  107/70 114/82  Pulse: 79  79 78  Resp: 11 15 11 14   Temp: 98 F (36.7 C)  98 F (36.7 C) 97.7 F (36.5 C)  TempSrc:    Oral  SpO2: 97%  98% 97%  Weight:      Height:         Physical Exam:  General: adult male in bed in NAD HEENT: NCAT  Eyes: EOMI sclera anicteric Neck: supple trachea midline  Heart: S1S2 no rub Lungs: clear but reduced; normal work of breathing on room air  Abdomen: soft/obese/nt Extremities: bilateral BKA's Skin: no rash on extremities exposed  Neuro: alert and oriented x 3 provides hx and follows commands Psych normal mood and affect GU foley in place    Medications reviewed   Labs:     Latest Ref Rng & Units 03/14/2022    7:44 AM 03/13/2022    2:19 AM 03/12/2022    3:23 AM  BMP  Glucose 70 - 99 mg/dL 174  221  106   BUN 6 - 20 mg/dL 69  67  93   Creatinine 0.61 - 1.24 mg/dL 10.06  9.70  12.91   Sodium  135 - 145 mmol/L 134  134  135   Potassium 3.5 - 5.1 mmol/L 3.1  3.0  3.5   Chloride 98 - 111 mmol/L 104  98  104   CO2 22 - 32 mmol/L 17  19  13    Calcium 8.9 - 10.3 mg/dL 6.8  6.9  6.2    6.0      Assessment/Plan:   # AKI  - progression to ESRD.   - He  is agreeable to start HD-  s/p TDC thru IR and for first HD 12/5, for second today 12/7  get VVS involved-  s/p AVF this AM-  thank you  and CLIP for OP HD- no news yet    - Note that He is felt to be only a candidate for in-center HD - he is currently living in a motel . He asks about transplant-  these discussions will be ongoing-  not sure his social support with pass the test     #  HTN  Controlled-  no meds-  does not seem too volume overloaded    # BPH  - continue finasteride -  he had relief with the foley - started flomax-  will need to attempt to remove and do voiding trial- done 12/6   # Hyponatremia - Mild and setting of AKI, dehydration - improving    # Hypokalemia - potassium is better-  high K bath      # Anemia CKD  - No acute indication for PRBC's -  now in the 9's-  iron OK -  giving ESA    # HIV  - has had for over 20 years per charting   Bones-   PTH 225 -  phos 7.7-  added phoslo   Dispo-  would like perm access and will need HD placement prior to discharge -  he also appears to be homeless ?  Complicating factor   Louis Meckel, MD 03/14/2022 11:55 AM

## 2022-03-14 NOTE — Anesthesia Postprocedure Evaluation (Signed)
Anesthesia Post Note  Patient: Keith [name]  Procedure(s) Performed: LEFT BRACHIOCEPHALIC ARTERIOVENOUS (AV) FISTULA CREATION (Left: Arm Upper)     Patient location during evaluation: PACU Anesthesia Type: Regional Level of consciousness: awake and alert Pain management: pain level controlled Vital Signs Assessment: post-procedure vital signs reviewed and stable Respiratory status: spontaneous breathing, nonlabored ventilation and respiratory function stable Cardiovascular status: blood pressure returned to baseline Postop Assessment: no apparent nausea or vomiting Anesthetic complications: no   No notable events documented.  Last Vitals:  Vitals:   03/14/22 1135 03/14/22 1149  BP: 107/70 114/82  Pulse: 79 78  Resp: 11 14  Temp: 36.7 C 36.5 C  SpO2: 98% 97%    Last Pain:  Vitals:   03/14/22 1149  TempSrc: Oral  PainSc: 0-No pain                 Marthenia Rolling

## 2022-03-14 NOTE — Progress Notes (Signed)
PT Cancellation Note  Patient Details Name: Keith [name] MRN: [id number] DOB: October 10, 1965   Cancelled Treatment:    Reason Eval/Treat Not Completed: Other (comment).  Refused post procedure, and will re-attempt tomorrow.   Ramond Dial 03/14/2022, 3:48 PM  Mee Hives, PT PhD Acute Rehab Dept. Number: [place]

## 2022-03-14 NOTE — Op Note (Signed)
    NAME: Keith [name]    MRN: [id number] DOB: 22-Feb-1966    DATE OF OPERATION: 03/14/2022  PREOP DIAGNOSIS:     Chronic kidney disease in need of long term HD access  POSTOP DIAGNOSIS:    Same  PROCEDURE:    Left arm brachiocephalic fistula  SURGEON: Broadus John  ASSIST: Dagoberto Ligas PA  ANESTHESIA: General   EBL: 38ml  INDICATIONS:    CASIN FEDERICI is a 56 y.o. male with chronic kidney disease in need of long-term HD access.  Vein mapping demonstrated adequate cephalic vein in the left upper extremity.  After discussing the risk and benefits of surgery, [name] elected to proceed.  FINDINGS:   5 mm sclerotic cephalic vein 5 mm brachial artery  TECHNIQUE:   The patient was brought to the operating room and placed in supine position. The left arm was prepped and draped in standard fashion. IV antibiotics were administered. A timeout was performed.   The case began with ultrasound insonation of the brachial artery and cephalic vein, which demonstrated sufficient size at the antecubital fossa for arteriovenous fistula.   A transverse incision was made below the elbow creese in the antecubital fossa. The  cephalic vein was isolated for 3 cm in length. Next the aponeurosis was partially released and the brachial artery secured with a vessel loop. The patient was heparinized. The cephalic vein was transected and ligated distally with a 2-0 silk stick-tie. The vein was dilated with coronary dilators and flushed with heparin saline. Vascular clamps were placed proximally and distally on the brachial artery and a 5 mm arteriotomy was created on the brachial artery. This was flushed with heparin saline.  An anastomosis was created in end to side fashion on the brachial artery using running 6-0 Prolene suture.  Prior to completing the anastomosis, the vessels were flushed and the suture line was tied down. There was an excellent thrill in the cephalic vein from the anastomosis  to the mid upper bicipital region. The patient had a multiphasic radial and ulnar signal. He had an excellent doppler signal in the fistula. The incision was irrigated and hemostasis achieved with cautery and suture. The deeper tissue was closed with 3-0 Vicryl and the skin closed with 4-0 Monocryl.  Dermabond was applied to the incisions. The patient was transferred to PACU in stable condition.  Given the complexity of the case,  the assistant was necessary in order to expedient the procedure and safely perform the technical aspects of the operation.  The assistant provided traction and countertraction to assist with exposure of the artery and vein.  They also assisted with suture ligation of multiple venous branches.  They played a critical role in the anastomosis. These skills, especially following the Prolene suture for the anastomosis, could not have been adequately performed by a scrub tech assistant.    Macie Burows, MD [facility] DATE OF DICTATION:   03/14/2022

## 2022-03-14 NOTE — Progress Notes (Signed)
   VASCULAR SURGERY ASSESSMENT & PLAN:   END-STAGE RENAL DISEASE: The patient is scheduled for a left AV fistula today.  Vein map shows that he appears to have an adequate left upper arm cephalic vein for a brachiocephalic fistula.  I have written for the nurses to remove the IV from the left arm in anticipation of surgery tomorrow.  In short, Keith [name] is a 56 year old male with disease, now requiring long-term HD access.  After discussing the risk and benefits of left arm brachiocephalic fistula, Keith [name] elected to proceed.   SUBJECTIVE:  NAEO  PHYSICAL EXAM:   Vitals:   03/13/22 1658 03/13/22 2102 03/14/22 0757 03/14/22 0824  BP: 112/71 (!) 132/92 125/82 112/77  Pulse: 68 65 63 68  Resp: 16 16 19 17   Temp: (!) 97.5 F (36.4 C) 98.6 F (37 C) 99 F (37.2 C) 98.1 F (36.7 C)  TempSrc: Axillary Oral Axillary Oral  SpO2: 97% 97% 98% 97%  Weight:    117.9 kg  Height:    5\' 10"  (1.778 m)   Palpable pulse at the wrist, no sensorimotor deficits   LABS:   Lab Results  Component Value Date   WBC 6.6 03/14/2022   HGB 9.6 (L) 03/14/2022   HCT 29.1 (L) 03/14/2022   MCV 81.3 03/14/2022   PLT 334 03/14/2022   Lab Results  Component Value Date   CREATININE 9.70 (H) 03/13/2022   Lab Results  Component Value Date   INR 1.0 04/25/2020   CBG (last 3)  Recent Labs    03/13/22 1658 03/13/22 2153 03/14/22 0800  GLUCAP 138* 158* 181*     PROBLEM LIST:    Principal Problem:   AKI (acute kidney injury) ([place]) Active Problems:   HTN (hypertension)   HIV disease (HCC)   Diarrhea   CKD (chronic kidney disease) stage 4, GFR 15-29 ml/min (HCC)   CAD (coronary artery disease)   BPH (benign prostatic hyperplasia)   GERD (gastroesophageal reflux disease)   Gallbladder anomaly   CURRENT MEDS:    [MAR Hold] aspirin EC  81 mg Oral Daily   [MAR Hold] atorvastatin  80 mg Oral Daily   [MAR Hold] bictegravir-emtricitabine-tenofovir AF  1 tablet Oral Daily   [MAR Hold] calcium  acetate  1,334 mg Oral TID WC   [MAR Hold] Chlorhexidine Gluconate Cloth  6 each Topical Daily   [MAR Hold] clopidogrel  75 mg Oral Daily   [MAR Hold] darbepoetin (ARANESP) injection - NON-DIALYSIS  60 mcg Subcutaneous Q Mon-1800   [MAR Hold] escitalopram  20 mg Oral Daily   [MAR Hold] finasteride  5 mg Oral Daily   [MAR Hold] insulin aspart  0-9 Units Subcutaneous TID WC   [MAR Hold] pregabalin  50 mg Oral BID   [MAR Hold] tamsulosin  0.4 mg Oral QPC supper    Deitra Mayo Office: [phone] 03/14/2022

## 2022-03-14 NOTE — Discharge Instructions (Addendum)
Phosphorus Content Of Foods Phosphorus is an important mineral that your body uses for energy and overall health. What you eat and drink can affect the amount of phosphorus in your body. The key to selecting foods with phosphorus is having the right balance for your needs. Natural and Added Phosphorus Natural Phosphorus: Phosphorus occurs naturally in meats, dairy, grains, and vegetables. Your body absorbs about half of this natural phosphorus from foods and drinks. Added Phosphorus: Phosphorus is also added to many foods and drinks as a preservative. Your body absorbs nearly all of the added phosphorus from foods and drinks. How Much Phosphorus is in Food and Drinks? Nutrition Facts labels don't typically include phosphorus amounts and they don't identify whether the phosphorus in the product is natural or added. Read the ingredients list to check if a product label displays "phos" in the ingredients. This abbreviation will indicate for sure that phosphorus has been added. Ingredients with phosphorus that are most commonly added to food include disodium phosphate, sodium hexametaphosphate, phosphoric acid, calcium phosphate, and dipotassium phosphate. Foods and drinks with the highest added phosphorus are usually processed foods, packaged foods, and fast foods.  Phosphorus in Foods   Lower Phosphorus Choices   Higher Phosphorus Choices    Grains and Baked Goods Fresh breads, buns, dinner rolls, bagels English muffins, or pitas without "phos" ingredients Plain cereals such as oatmeal, corn flakes, rice crispies Reduced-salt crackers, rice cakes, pretzels, popcorn, or tortilla chips without "phos" ingredients Processed breads and cereals with phosphorus additives on food label Biscuits, brownies, cakes, muffins, pancakes, pastries, or waffles that are ready-to-eat or made from a dry mix with "phos" on the label Refrigerated or frozen dough for biscuits, cookies, pastries, or sweet rolls with "phos"  ingredients  Protein Foods All-natural chicken, [place], fish or seafood Lean and fresh beef, lamb, pork, veal, or wild game (3 ounces is size of palm of hand) Whole eggs or egg whites (1 egg is 1 ounce) Tofu, beans, lentils, hummus (1/4-1/3 cup) Unsalted nuts (1/4 cup) or nut butters (1 tablespoon) Processed meats like bacon, ham, hot dogs, chicken nuggets or strips, bologna, salami, or sausage Breaded or fried meats, chicken, fish or seafood Organ meats such as kidney or liver  Dairy and Dairy Alternatives Unfortified dairy alternates such as almond or rice beverages Milk or soy beverage (1/2 cup) Cottage cheese with no "phos" ingredients (1/2 cup) Yogurt (6 ounces) all natural, unsweetened, or plain preferred Natural cheese such as brie, feta, Swiss, cheddar, or mozzarella.  Only have a small amount (1 ounce - size of your thumb or 2 dice) Regular or low-fat cream cheese, [place], or sour cream (1 tbsp) Sherbet, sorbet, fruit ice, or popsicles (1/2 cup) Non-dairy creamers and some half and half creamers with "phos"  Enriched dairy alternates such as almond, oat or rice beverages Processed cheese, such as American Processed cheese spreads and dips Fat free cream cheese or sour cream Ice cream, pudding or frozen yogurt Milk-based or cheese-based soups or sauces  Vegetables Fresh, frozen, or canned without added "phos" ingredients Vegetables with sauces added Frozen or packaged potatoes or vegetables with "phos" ingredients  Fruits Fresh, frozen or canned without added "phos" ingredients  Fresh, frozen or canned with added "phos" ingredients  Beverages Water Fresh brewed coffee or tea Fresh lemonade or pure fruit juice (1/2 cup) Beverages without "phos" ingredients Beverage or powdered mix with "phos" ingredients: most colas, energy and sports drinks, canned or bottled coffees and teas, flavored waters and drink mixes. Beers  and wines  Other (Fast, convenience or restaurant foods)  Hamburgers, fish filet (no cheese), plain chicken wings Grilled, roasted, broiled, baked fish, chicken, [place], fish or seafood. Plain prepared eggs (no cheese, ham, bacon, sausage), [place] toast, English muffin, bagel, hot cereal, toast Pizza without meat or extra cheese Tacos, burritos, enchiladas fajitas with limited toppings. Choose white rice, lettuce, sauted onions, bell peppers on the side Tuna or egg salad sandwich (no cheese) Sides: salad without cheese, coleslaw, apple slices, applesauce, grapes, or carrots Meals with no "phos" ingredients and have less than 600 milligrams of sodium per serving Battered or fried fish or chicken including nuggets, sandwiches, strips or wings Pizza with meat and extra cheese Tacos, burritos, enchiladas with meat and toppings such as cheese and beans Hot dogs and sausages Any sandwich made with ham, processed deli meats, American cheese, or bacon [place] fries or other fried potatoes or battered vegetables, biscuits, or macaroni and cheese Meals or soups with "phos" ingredients and more than 600 millligrams of sodium per serving    Copyright 2020  Academy of [employer] and Dietetics. All rights reserved   Low Sodium Nutrition Therapy  Eating less sodium can help you if you have high blood pressure, heart failure, or kidney or liver disease.   Your body needs a little sodium, but too much sodium can cause your body to hold onto extra water. This extra water will raise your blood pressure and can cause damage to your heart, kidneys, or liver as they are forced to work harder.   Sometimes you can see how the extra fluid affects you because your hands, legs, or belly swell. You may also hold water around your heart and lungs, which makes it hard to breathe.   Even if you take medication for blood pressure or a water pill (diuretic) to remove fluid, it is still important to have less salt in your diet.   Check with your primary care provider before  drinking alcohol since it may affect the amount of fluid in your body and how your heart, kidneys, or liver work. Sodium in Food A low-sodium meal plan limits the sodium that you get from food and beverages to 1,500-2,000 milligrams (mg) per day. Salt is the main source of sodium. Read the nutrition label on the package to find out how much sodium is in one serving of a food.  Select foods with 140 milligrams (mg) of sodium or less per serving.  You may be able to eat one or two servings of foods with a little more than 140 milligrams (mg) of sodium if you are closely watching how much sodium you eat in a day.  Check the serving size on the label. The amount of sodium listed on the label shows the amount in one serving of the food. So, if you eat more than one serving, you will get more sodium than the amount listed.  Tips Cutting Back on Sodium Eat more fresh foods.  Fresh fruits and vegetables are low in sodium, as well as frozen vegetables and fruits that have no added juices or sauces.  Fresh meats are lower in sodium than processed meats, such as bacon, sausage, and hotdogs.  Not all processed foods are unhealthy, but some processed foods may have too much sodium.  Eat less salt at the table and when cooking. One of the ingredients in salt is sodium.  One teaspoon of table salt has 2,300 milligrams of sodium.  Leave the salt out of recipes for  pasta, casseroles, and soups. Be a Paramedic.  Food packages that say "Salt-free", sodium-free", "very low sodium," and "low sodium" have less than 140 milligrams of sodium per serving.  Beware of products identified as "Unsalted," "No Salt Added," "Reduced Sodium," or "Lower Sodium." These items may still be high in sodium. You should always check the nutrition label. Add flavors to your food without adding sodium.  Try lemon juice, lime juice, or vinegar.  Dry or fresh herbs add flavor.  Buy a sodium-free seasoning blend or make your own at  home. You can purchase salt-free or sodium-free condiments like barbeque sauce in stores and online. Ask your registered dietitian nutritionist for recommendations and where to find them.   Eating in Restaurants Choose foods carefully when you eat outside your home. Restaurant foods can be very high in sodium. Many restaurants provide nutrition facts on their menus or their websites. If you cannot find that information, ask your server. Let your server know that you want your food to be cooked without salt and that you would like your salad dressing and sauces to be served on the side.    Foods Recommended Food Group Foods Recommended  Grains Bread, bagels, rolls without salted tops Homemade bread made with reduced-sodium baking powder Cold cereals, especially shredded wheat and puffed rice Oats, grits, or cream of wheat Pastas, quinoa, and rice Popcorn, pretzels or crackers without salt Corn tortillas  Protein Foods Fresh meats and fish; [place] bacon (check the nutrition labels - make sure they are not packaged in a sodium solution) Canned or packed tuna (no more than 4 ounces at 1 serving) Beans and peas Soybeans) and tofu Eggs Nuts or nut butters without salt  Dairy Milk or milk powder Plant milks, such as rice and soy Yogurt, including Greek yogurt Small amounts of natural cheese (blocks of cheese) or reduced-sodium cheese can be used in moderation. (Swiss, ricotta, and fresh mozzarella cheese are lower in sodium than the others) Cream Cheese Low sodium cottage cheese  Vegetables Fresh and frozen vegetables without added sauces or salt Homemade soups (without salt) Low-sodium, salt-free or sodium-free canned vegetables and soups  Fruit Fresh and canned fruits Dried fruits, such as raisins, cranberries, and prunes  Oils Tub or liquid margarine, regular or without salt Canola, corn, peanut, olive, safflower, or sunflower oils  Condiments Fresh or dried herbs such as basil, bay  leaf, dill, mustard (dry), nutmeg, paprika, parsley, rosemary, sage, or thyme.  Low sodium ketchup Vinegar  Lemon or lime juice Pepper, red pepper flakes, and cayenne. Hot sauce contains sodium, but if you use just a drop or two, it will not add up to much.  Salt-free or sodium-free seasoning mixes and marinades Simple salad dressings: vinegar and oil   Foods Not Recommended Food Group Foods Not Recommended  Grains Breads or crackers topped with salt Cereals (hot/cold) with more than 300 mg sodium per serving Biscuits, cornbread, and other "quick" breads prepared with baking soda Pre-packaged bread crumbs Seasoned and packaged rice and pasta mixes Self-rising flours  Protein Foods Cured meats: Bacon, ham, sausage, pepperoni and hot dogs Canned meats (chili, vienna sausage, or sardines) Smoked fish and meats Frozen meals that have more than 600 mg of sodium per serving Egg substitute (with added sodium)  Dairy Buttermilk Processed cheese spreads Cottage cheese (1 cup may have over 500 mg of sodium; look for low-sodium.) American or feta cheese Shredded Cheese has more sodium than blocks of cheese String cheese  Vegetables Canned  vegetables (unless they are salt-free, sodium-free or low sodium) Frozen vegetables with seasoning and sauces Sauerkraut and pickled vegetables Canned or dried soups (unless they are salt-free, sodium-free, or low sodium) [place] fries and onion rings  Fruit Dried fruits preserved with additives that have sodium  Oils Salted butter or margarine, all types of olives  Condiments Salt, sea salt, kosher salt, onion salt, and garlic salt Seasoning mixes with salt Bouillon cubes Ketchup Barbeque sauce and Worcestershire sauce unless low sodium Soy sauce Salsa, pickles, olives, relish Salad dressings: ranch, blue cheese, [place], and [place].   Low Sodium Sample 1-Day Menu  Breakfast 1 cup cooked oatmeal  1 slice whole wheat bread toast  1 tablespoon  peanut butter without salt  1 banana  1 cup 1% milk  Lunch Tacos made with: 2 corn tortillas   cup black beans, low sodium   cup roasted or grilled chicken (without skin)   avocado  Squeeze of lime juice  1 cup salad greens  1 tablespoon low-sodium salad dressing   cup strawberries  1 orange  Afternoon Snack 1/3 cup grapes  6 ounces yogurt  Evening Meal 3 ounces herb-baked fish  1 baked potato  2 teaspoons olive oil   cup cooked carrots  2 thick slices tomatoes on:  2 lettuce leaves  1 teaspoon olive oil  1 teaspoon balsamic vinegar  1 cup 1% milk  Evening Snack 1 apple   cup almonds without salt   Low-Sodium Vegetarian (Lacto-Ovo) Sample 1-Day Menu  Breakfast 1 cup cooked oatmeal  1 slice whole wheat toast  1 tablespoon peanut butter without salt  1 banana  1 cup 1% milk  Lunch Tacos made with: 2 corn tortillas   cup black beans, low sodium   cup roasted or grilled chicken (without skin)   avocado  Squeeze of lime juice  1 cup salad greens  1 tablespoon low-sodium salad dressing   cup strawberries  1 orange  Evening Meal Stir fry made with:  cup tofu  1 cup brown rice   cup broccoli   cup green beans   cup peppers   tablespoon peanut oil  1 orange  1 cup 1% milk  Evening Snack 4 strips celery  2 tablespoons hummus  1 hard-boiled egg   Sodium Free Flavoring Tips  When cooking, the following items may be used for flavoring instead of salt or seasonings that contain sodium. Remember: A little bit of spice goes a long way! Be careful not to overseason. Spice Blend Recipe (makes about ? cup) 5 teaspoons onion powder  2 teaspoons garlic powder  2 teaspoons paprika  2 teaspoon dry mustard  1 teaspoon crushed thyme leaves   teaspoon white pepper   teaspoon celery seed Food Item Flavorings  Beef Basil, bay leaf, caraway, curry, dill, dry mustard, garlic, grape jelly, green pepper, mace, marjoram, mushrooms (fresh), nutmeg, onion or onion  powder, parsley, pepper, rosemary, sage  Chicken Basil, cloves, cranberries, mace, mushrooms (fresh), nutmeg, oregano, paprika, parsley, pineapple, saffron, sage, savory, tarragon, thyme, tomato, turmeric  Egg Chervil, curry, dill, dry mustard, garlic or garlic powder, green pepper, jelly, mushrooms (fresh), nutmeg, onion powder, paprika, parsley, rosemary, tarragon, tomato  Fish Basil, bay leaf, chervil, curry, dill, dry mustard, green pepper, lemon juice, marjoram, mushrooms (fresh), paprika, pepper, tarragon, tomato, turmeric  Lamb Cloves, curry, dill, garlic or garlic powder, mace, mint, mint jelly, onion, oregano, parsley, pineapple, rosemary, tarragon, thyme  Pork Applesauce, basil, caraway, chives, cloves, garlic or garlic powder,  onion or onion powder, rosemary, thyme  Veal Apricots, basil, bay leaf, currant jelly, curry, ginger, marjoram, mushrooms (fresh), oregano, paprika  Vegetables Basil, dill, garlic or garlic powder, ginger, lemon juice, mace, marjoram, nutmeg, onion or onion powder, tarragon, tomato, sugar or sugar substitute, salt-free salad dressing, vinegar  Desserts Allspice, anise, cinnamon, cloves, ginger, mace, nutmeg, vanilla extract, other extracts   Copyright 2020  Academy of Nutrition and Dietetics. All rights reserved    [facility]  Discharge Instructions  AV Fistula or Graft Surgery for Dialysis Access  Please refer to the following instructions for your post-procedure care. Your surgeon or physician assistant will discuss any changes with you.  Activity  You may drive the day following your surgery, if you are comfortable and no longer taking prescription pain medication. Resume full activity as the soreness in your incision resolves.  Bathing/Showering  You may shower after you go home. Keep your incision dry for 48 hours. Do not soak in a bathtub, hot tub, or swim until the incision heals completely. You may not shower if  you have a hemodialysis catheter.  Incision Care  Clean your incision with mild soap and water after 48 hours. Pat the area dry with a clean towel. You do not need a bandage unless otherwise instructed. Do not apply any ointments or creams to your incision. You may have skin glue on your incision. Do not peel it off. It will come off on its own in about one week. Your arm may swell a bit after surgery. To reduce swelling use pillows to elevate your arm so it is above your heart. Your doctor will tell you if you need to lightly wrap your arm with an ACE bandage.  Diet  Resume your normal diet. There are not special food restrictions following this procedure. In order to heal from your surgery, it is CRITICAL to get adequate nutrition. Your body requires vitamins, minerals, and protein. Vegetables are the best source of vitamins and minerals. Vegetables also provide the perfect balance of protein. Processed food has little nutritional value, so try to avoid this.  Medications  Resume taking all of your medications. If your incision is causing pain, you may take over-the counter pain relievers such as acetaminophen (Tylenol). If you were prescribed a stronger pain medication, please be aware these medications can cause nausea and constipation. Prevent nausea by taking the medication with a snack or meal. Avoid constipation by drinking plenty of fluids and eating foods with high amount of fiber, such as fruits, vegetables, and grains. Do not take Tylenol if you are taking prescription pain medications.     Follow up Your surgeon may want to see you in the office following your access surgery. If so, this will be arranged at the time of your surgery.  Please call us immediately for any of the following conditions:  Increased pain, redness, drainage (pus) from your incision site Fever of 101 degrees or higher Severe or worsening pain at your incision site Hand pain or numbness.  Reduce your risk  of vascular disease:  Stop smoking. If you would like help, call [facility] at 1-800-QUIT-NOW (762)106-0121) or [facility] [place] your cholesterol Maintain a desired weight Control your diabetes Keep your blood pressure down  Dialysis  It will take several weeks to several months for your new dialysis access to be ready for use. Your surgeon will determine when it is OK to use it. Your nephrologist will continue  to direct your dialysis. You can continue to use your Permcath until your new access is ready for use.  If you have any questions, please call the office at [phone].

## 2022-03-14 NOTE — Progress Notes (Signed)
Pt seen and examined postop. Doing well. Incision site is soft. Thrill appreciated in fistula. Palpable at the wrist. Will see in 4 weeks in clinic.   Broadus John MD

## 2022-03-14 NOTE — Progress Notes (Signed)
Initial Nutrition Assessment  DOCUMENTATION CODES:   Obesity unspecified  INTERVENTION:  Liberalize diet to low Na with 1267mL fluid restriction Continue phosphate binder Continue to reinforce phosphorus and Na education throughout admission Monitor intake and provide prosource if unable to meet estimated needs during admission.  NUTRITION DIAGNOSIS:  Food and nutrition related knowledge deficit related to chronic illness (new onset ESRD) as evidenced by other (comment) (lack of prior education).  GOAL:  Patient will meet greater than or equal to 90% of their needs  MONITOR:  Other (Comment), PO intake (nutrition questions)  REASON FOR ASSESSMENT:  Consult Diet education  ASSESSMENT:  Pt is a 56yo M with PMH of HIV/AIDS, HLD, HTN, GERD, osteomyelitis, T2DM, right BKA, left AKA, CAD, CKD, and CVA who presents with generalized wekaness, diarrhea and bloating.  Pt requiring initiation of HD during admission. Is now POD 0 s/p AVF. Visited at bedside following lunch, pt sleeping soundly. Left printed diet information for pt, also attached handouts to AVS. Provided low Na MNT handout and phosphorus content of food handout. K has been low or WNL throughout admission. Recommend liberalizing diet to low Na with fluid restriction. Inpatient renal diet does not limit Phosphorus. Started on phoslo during admission. RD to follow up for reinforcement and questions about diet education as needed. Pt to follow up with renal RD at dialysis center  Medications reviewed and include: lipitor, phoslo, novolog with meals, prn zofran  Labs reviewed: Na:134, K:3.1, BG:138-182, BUN:69, Cr:10.06Phos:5.9, GFR:5    NUTRITION - FOCUSED PHYSICAL EXAM: WNL  Diet Order:   Diet Order             Diet 2 gram sodium Room service appropriate? Yes; Fluid consistency: Thin; Fluid restriction: 1200 mL Fluid  Diet effective now                   EDUCATION NEEDS:   Education needs have been  addressed  Skin:  Skin Assessment: Reviewed RN Assessment  Last BM:  12/7  Height:  Ht Readings from Last 1 Encounters:  03/14/22 5\' 10"  (1.778 m)    Weight:  Wt Readings from Last 1 Encounters:  03/14/22 117.9 kg    BMI:  Body mass index is 37.31 kg/m.  Estimated Nutritional Needs:  Kcal:  2100-2600kcal Protein:  110-135g Fluid:  1033mL + UOP  Candise Bowens, MS, RD, LDN, CNSC See AMiON for contact information

## 2022-03-14 NOTE — Progress Notes (Signed)
Contacted [facility] admissions this morning to request update on pt's clinic acceptance and schedule. Will await a response.   Melven Sartorius Renal Navigator [phone]

## 2022-03-14 NOTE — Anesthesia Procedure Notes (Signed)
Procedure Name: General with mask airway Date/Time: 03/14/2022 9:52 AM  Performed by: Erick Colace, CRNAPre-anesthesia Checklist: Patient identified, Emergency Drugs available, Suction available and Patient being monitored Patient Re-evaluated:Patient Re-evaluated prior to induction Oxygen Delivery Method: Simple face mask Preoxygenation: Pre-oxygenation with 100% oxygen Induction Type: IV induction Dental Injury: Teeth and Oropharynx as per pre-operative assessment

## 2022-03-14 NOTE — Progress Notes (Signed)
PT Cancellation Note  Patient Details Name: Keith [name] MRN: [id number] DOB: 05-15-1965   Cancelled Treatment:    Reason Eval/Treat Not Completed: Patient at procedure or test/unavailable. L AV fistula today, will reattempt as time and pt allow.   Ramond Dial 03/14/2022, 10:30 AM  Mee Hives, PT PhD Acute Rehab Dept. Number: [place] [facility] [place]

## 2022-03-14 NOTE — Anesthesia Procedure Notes (Signed)
Anesthesia Regional Block: Supraclavicular block   Pre-Anesthetic Checklist: , timeout performed,  Correct Patient, Correct Site, Correct Laterality,  Correct Procedure, Correct Position, site marked,  Risks and benefits discussed,  Pre-op evaluation,  At surgeon's request and post-op pain management  Laterality: Left  Prep: Maximum Sterile Barrier Precautions used, chloraprep       Needles:  Injection technique: Single-shot  Needle Type: Echogenic Stimulator Needle     Needle Length: 9cm  Needle Gauge: 22     Additional Needles:   Procedures:,,,, ultrasound used (permanent image in chart),,    Narrative:  Start time: 03/14/2022 9:22 AM End time: 03/14/2022 9:25 AM Injection made incrementally with aspirations every 5 mL.  Performed by: Personally  Anesthesiologist: Brennan Bailey, MD  Additional Notes: Risks, benefits, and alternative discussed. Patient gave consent for procedure. Patient prepped and draped in sterile fashion. Sedation administered, patient remains easily responsive to voice. Relevant anatomy identified with ultrasound guidance. Local anesthetic given in 5cc increments with no signs or symptoms of intravascular injection. No pain or paraesthesias with injection. Patient monitored throughout procedure with signs of LAST or immediate complications. Tolerated well. Ultrasound image placed in chart.  Tawny Asal, MD

## 2022-03-14 NOTE — Anesthesia Preprocedure Evaluation (Addendum)
Anesthesia Evaluation  Patient identified by MRN, date of birth, ID band Patient awake    Reviewed: Allergy & Precautions, NPO status , Patient's Chart, lab work & pertinent test results, reviewed documented beta blocker date and time   History of Anesthesia Complications Negative for: history of anesthetic complications  Airway Mallampati: II  TM Distance: >3 FB Neck ROM: Full    Dental no notable dental hx.    Pulmonary neg pulmonary ROS   Pulmonary exam normal        Cardiovascular hypertension, Pt. on medications and Pt. on home beta blockers + CAD, + Past MI (on Plavix), + Cardiac Stents (2020, 2021) and + Peripheral Vascular Disease  Normal cardiovascular exam     Neuro/Psych  Headaches  Anxiety     CVA (2022)    GI/Hepatic Neg liver ROS,GERD  ,,  Endo/Other  diabetes, Type 2, Insulin Dependent    Renal/GU ESRF and DialysisRenal disease     Musculoskeletal  (+) Arthritis ,    Abdominal   Peds  Hematology  (+) Blood dyscrasia (Hgb 9.6), anemia   Anesthesia Other Findings Day of surgery medications reviewed with patient.  Reproductive/Obstetrics                              Anesthesia Physical Anesthesia Plan  ASA: 3  Anesthesia Plan: MAC and Regional   Post-op Pain Management: Tylenol PO (pre-op)*   Induction:   PONV Risk Score and Plan: 1 and Treatment may vary due to age or medical condition, Ondansetron, Propofol infusion and Midazolam  Airway Management Planned: Natural Airway and Simple Face Mask  Additional Equipment: None  Intra-op Plan:   Post-operative Plan:   Informed Consent: I have reviewed the patients History and Physical, chart, labs and discussed the procedure including the risks, benefits and alternatives for the proposed anesthesia with the patient or authorized representative who has indicated his/her understanding and acceptance.       Plan  Discussed with: CRNA  Anesthesia Plan Comments:          Anesthesia Quick Evaluation

## 2022-03-14 NOTE — Progress Notes (Signed)
[facility] PROGRESS NOTE  BRAYDAN MARRIOTT (DOB: 01/01/1966) JOI:[id number] [name], NP  Brief Narrative: Keith [name] is a 56 y.o. male with a history of stage IV CKD, HIV disease, IDT2DM, PVD s/p bilateral LE amputations, BPH who presented to the ED on 03/09/2022 with weakness and diarrhea found to have Salmonella and EPEC in stool studies. He was treated supportively, though suffered ARF for which nephrology was consulted and hemodialysis is initiated.   Subjective: Tolerated procedure and has eaten lunch afterward. Denies any pain. No abdominal pain. No new complaints.   Objective: BP 114/82 (BP Location: Left Arm)   Pulse 78   Temp 97.7 F (36.5 C) (Oral)   Resp 14   Ht 5\' 10"  (1.778 m)   Wt 117.9 kg   SpO2 97%   BMI 37.31 kg/m   Gen: No distress, seen after procedure. Pulm: Clear, nonlabored CV: RRR, no JVD GI: +BS, soft, NT, ND. Gas in rectal tube bag with light brown liquids stool, only 25cc output. Neuro: Rousable and responsive, declines full exam but moves all extremities. Ext: Warm, dry, stable stump sites Skin: No tenderness to right IJ TDC. Left arm transverse incision c/d/I with dermabond. Palpable thrill with warm left hand with palpable radial pulse.   Assessment & Plan: Salmonella gastroenteritis, EPEC colonization: Suspected per ID.  - Treated with cipro/flagyl x1 and then CTX x3 days. No further Tx needed per ID.  - Stool output monitoring, will DC rectal tube with declining output. - Enteric precautions  ARF on stage IV CKD, now ESRD:  - Continue HD per nephrology thru R IJ [facility] placed 12/5 by Dr. Serafina Royals. Next 12/7.  - s/p left brachiocephalic fistula by Dr. Virl Cagey 12/7. Follow up in 4 weeks.  - Outpatient HD spot pending. - Mother offers misgivings about adherence to dialysis regimen. We will encourage and provide every opportunity/resource available to facilitate his adherence.  BPH with acute urinary retention:  - Voiding trial  -  Continue tamsulosin, finasteride  HIV disease: CD4 count is 129, VL undetectable - Change ART to biktarvy, d/w ID, Dr. Linus Salmons now that patient is ESRD.  - Follow up with [facility], Dr. Candiss Norse, arranged 1/8 at 10:00am.  IDT2DM:  - Continue SSI, will need to augment as po intake increases.   HTN:   - Hold hydralazine, norvasc, coreg, HCTZ, losartan for now. BP still normal.  Depression, anxiety:  - Continue lexapro  Neuropathy:  - Continue reduced dose lyrica.   Hypokalemia:  - Tx per nephrology  Hypocalcemia:  - Tx per nephrology  PVD: s/p left AKA, right BKA.  - Follow up with [name] per routine  CAD: s/p DES 2021.  - Continue ASA, plavix, statin (LFTs ok)  Patrecia Pour, MD [facility] [email or web address] 03/14/2022, 2:47 PM

## 2022-03-14 NOTE — Transfer of Care (Signed)
Immediate Anesthesia Transfer of Care Note  Patient: ELZIA HOTT  Procedure(s) Performed: LEFT BRACHIOCEPHALIC ARTERIOVENOUS (AV) FISTULA CREATION (Left: Arm Upper)  Patient Location: drowsy  Anesthesia Type:MAC combined with regional for post-op pain  Level of Consciousness: drowsy  Airway & Oxygen Therapy: Patient Spontanous Breathing  Post-op Assessment: Report given to RN and Post -op Vital signs reviewed and stable  Post vital signs: Reviewed and stable  Last Vitals:  Vitals Value Taken Time  BP 114/82 03/14/22 1149  Temp 36.5 C 03/14/22 1149  Pulse 72 03/14/22 1359  Resp 14 03/14/22 1149  SpO2 100 % 03/14/22 1359  Vitals shown include unvalidated device data.  Last Pain:  Vitals:   03/14/22 1149  TempSrc: Oral  PainSc: 0-No pain      Patients Stated Pain Goal: 0 (61/68/37 2902)  Complications: No notable events documented.

## 2022-03-15 ENCOUNTER — Encounter (HOSPITAL_COMMUNITY): Payer: Self-pay | Admitting: Vascular Surgery

## 2022-03-15 ENCOUNTER — Other Ambulatory Visit (HOSPITAL_COMMUNITY): Payer: Self-pay

## 2022-03-15 DIAGNOSIS — A09 Infectious gastroenteritis and colitis, unspecified: Secondary | ICD-10-CM | POA: Diagnosis not present

## 2022-03-15 DIAGNOSIS — I25118 Atherosclerotic heart disease of native coronary artery with other forms of angina pectoris: Secondary | ICD-10-CM | POA: Diagnosis not present

## 2022-03-15 DIAGNOSIS — N400 Enlarged prostate without lower urinary tract symptoms: Secondary | ICD-10-CM | POA: Diagnosis not present

## 2022-03-15 DIAGNOSIS — Z7189 Other specified counseling: Secondary | ICD-10-CM | POA: Diagnosis not present

## 2022-03-15 DIAGNOSIS — Z515 Encounter for palliative care: Secondary | ICD-10-CM | POA: Diagnosis not present

## 2022-03-15 DIAGNOSIS — N179 Acute kidney failure, unspecified: Secondary | ICD-10-CM | POA: Diagnosis not present

## 2022-03-15 LAB — BASIC METABOLIC PANEL
Anion gap: 15 (ref 5–15)
BUN: 42 mg/dL — ABNORMAL HIGH (ref 6–20)
CO2: 22 mmol/L (ref 22–32)
Calcium: 7.1 mg/dL — ABNORMAL LOW (ref 8.9–10.3)
Chloride: 98 mmol/L (ref 98–111)
Creatinine, Ser: 7.43 mg/dL — ABNORMAL HIGH (ref 0.61–1.24)
GFR, Estimated: 8 mL/min — ABNORMAL LOW (ref >60)
Glucose, Bld: 193 mg/dL — ABNORMAL HIGH (ref 70–99)
Potassium: 3.3 mmol/L — ABNORMAL LOW (ref 3.5–5.1)
Sodium: 135 mmol/L (ref 135–145)

## 2022-03-15 LAB — GLUCOSE, CAPILLARY
Glucose-Capillary: 161 mg/dL — ABNORMAL HIGH (ref 70–99)
Glucose-Capillary: 181 mg/dL — ABNORMAL HIGH (ref 70–99)
Glucose-Capillary: 249 mg/dL — ABNORMAL HIGH (ref 70–99)
Glucose-Capillary: 221 mg/dL — ABNORMAL HIGH (ref 70–99)

## 2022-03-15 MED ORDER — CHLORHEXIDINE GLUCONATE CLOTH 2 % EX PADS
6.0000 | MEDICATED_PAD | Freq: Every day | CUTANEOUS | Status: DC
  Administered 2022-03-16 – 2022-03-18 (×4): 6 via TOPICAL

## 2022-03-15 NOTE — Progress Notes (Signed)
Pt has been accepted at [facility] on TTS 12:15 chair time. Pt can start on Tuesday and will need to arrive at 11:15 to complete paperwork prior to treatment. Attempted to meet with pt to go over details but pt could not be woke up in order to converse about information. Schedule letter left at pt's bedside. Attempted to reach pt's brother, Christia Reading, who pt gave permission to speak to earlier this week. Unable to reach brother via phone and unable to leave a message. Update provided to attending, nephrologist, RN CM, and CSW via secure chat. Pt may require snf at d/c. Will assist as needed.   Melven Sartorius Renal Navigator [phone]

## 2022-03-15 NOTE — Progress Notes (Signed)
This chaplain responded to PMT NP-Michelle referral for notarizing the Pt. Advance Directive:  HCPOA and Living Will.  The Pt. confirmed AD education with the chaplain and his choice of HCPOA-Tim Kenner (brother). The Pt. declined a notary visit, sharing "he is feeling a little weak." Therapy entered the room as the chaplain exited. PMT was updated.  This chaplain will attempt a F/U visit.  Chaplain Sallyanne Kuster [phone]

## 2022-03-15 NOTE — Evaluation (Signed)
Physical Therapy Evaluation Patient Details Name: Keith [name] MRN: [id number] DOB: 07/17/1965 Today's Date: 03/15/2022  History of Present Illness  Pt is a 56 y.o. male who presented 03/09/22 with generalized weakness and bouts of diarrhea. Found to have Salmonella and EPEC in stool studies and suffered ARF. HD catheter placed 12/5, L arm brachiocephalic fistula 44/9. PMHx: R BKA, L AKA, AIDS, CVA, CAD, MI, HTN, CKD, DM2, PVD, CKD, HLD   Clinical Impression  Pt presents with condition above and deficits mentioned below, see PT Problem List. PTA, he was mod I for functional mobility either using his w/c or his rollator for short gait distances with bil prostheses donned. Pt is currently living with his mother and brother in a motel. Per chart, it appears the level of assistance available at d/c is uncertain. Currently, pt is demonstrating deficits in static and dynamic balance, strength, activity tolerance, processing speed, attention, and awareness. No family was present to confirm pt's baseline cognition. He is requiring up to modA to transition supine > sit EOB and minA to prevent or recover LOB sitting EOB. He also required minA to perform an anterior-posterior transfer bed > recliner and declined attempts to use his prostheses at this time. Pending pt's available assistance at d/c, recommending short-term rehab at a SNF vs HHPT. Will continue to follow acutely.       Recommendations for follow up therapy are one component of a multi-disciplinary discharge planning process, led by the attending physician.  Recommendations may be updated based on patient status, additional functional criteria and insurance authorization.  Follow Up Recommendations Skilled nursing-short term rehab (<3 hours/day) (HHPT if family can provide level of care needed) Can patient physically be transported by private vehicle: No    Assistance Recommended at Discharge Intermittent Supervision/Assistance  Patient can  return home with the following  A lot of help with walking and/or transfers;A lot of help with bathing/dressing/bathroom;Assistance with cooking/housework;Assist for transportation;Direct supervision/assist for financial management;Direct supervision/assist for medications management    Equipment Recommendations None recommended by PT  Recommendations for Other Services       Functional Status Assessment Patient has had a recent decline in their functional status and demonstrates the ability to make significant improvements in function in a reasonable and predictable amount of time.     Precautions / Restrictions Precautions Precautions: Fall;Other (comment) Precaution Comments: enteric; R BKA, L AKA with bil prostheses Restrictions Weight Bearing Restrictions: No      Mobility  Bed Mobility Overal bed mobility: Needs Assistance Bed Mobility: Supine to Sit     Supine to sit: Mod assist, HOB elevated     General bed mobility comments: Pt requiring extra time and multiple attempts to ascend trunk to sit up L EOB but ultimately required modA to boost his trunk up to sit. Pt with instability and LOB sitting but would catch self with arm and need up to minA to recover.    Transfers Overall transfer level: Needs assistance Equipment used: None Transfers: Bed to chair/wheelchair/BSC         Anterior-Posterior transfers: Min assist   General transfer comment: MinA for trunk stability and to advance R leg when turning around in chair to his R when performing anterior-posterior transfer bed > recliner, instability and extra effort noted to complete.    Ambulation/Gait               General Gait Details: deferred using prostheses at this time  Stairs  Wheelchair Mobility    Modified Rankin (Stroke Patients Only)       Balance Overall balance assessment: Needs assistance Sitting-balance support: Single extremity supported, Bilateral upper extremity  supported, Feet unsupported Sitting balance-Leahy Scale: Poor Sitting balance - Comments: Reliant on UE support and noted lateral LOB bouts in which pt recovered with UE support and up to minA.                                     Pertinent Vitals/Pain Pain Assessment Pain Assessment: Faces Faces Pain Scale: Hurts a little bit Pain Location: grimacing with general mobility Pain Descriptors / Indicators: Grimacing Pain Intervention(s): Limited activity within patient's tolerance, Monitored during session, Repositioned    Home Living Family/patient expects to be discharged to:: Other (Comment) Living Arrangements: Parent;Other relatives (mother and brother)                 Additional Comments: Living in a motel with level entry and tub/shower currently. Has rollator, 3in1, w/c, and tub bench. Per pt, he works and his mother and brother work opposite shifts so 24/7 care is available    Prior Function Prior Level of Function : Independent/Modified Independent             Mobility Comments: Uses rollator and bilateral prosthetic for short distance ambulation. Uses WC for further mobility       Hand Dominance        Extremity/Trunk Assessment   Upper Extremity Assessment Upper Extremity Assessment: Defer to OT evaluation    Lower Extremity Assessment Lower Extremity Assessment: Generalized weakness;RLE deficits/detail;LLE deficits/detail RLE Deficits / Details: R BKA; generalized weakness LLE Deficits / Details: L AKA, generalized weakness    Cervical / Trunk Assessment Cervical / Trunk Assessment: Normal  Communication   Communication: Other (comment) (soft spoken at times)  Cognition Arousal/Alertness: Awake/alert Behavior During Therapy: Flat affect Overall Cognitive Status: No family/caregiver present to determine baseline cognitive functioning                                 General Comments: Pt slow to process and respond,  often with soft-spoken grumbling voice. Unsure of pt's awareness of extent of his weakness and deficits impacting his safety and need for assistance as pt reported he thought the transfer "went well" and wants to "walk later", even though pt was off balance sitting EOB and transfer was a bit unsteady. No family present to confirm baseline        General Comments General comments (skin integrity, edema, etc.): BP soft but stable, pt reporting mild lightheadedness    Exercises     Assessment/Plan    PT Assessment Patient needs continued PT services  PT Problem List Decreased strength;Decreased activity tolerance;Decreased balance;Decreased mobility;Cardiopulmonary status limiting activity       PT Treatment Interventions DME instruction;Gait training;Functional mobility training;Therapeutic activities;Therapeutic exercise;Balance training;Neuromuscular re-education;Cognitive remediation;Patient/family education;Wheelchair mobility training    PT Goals (Current goals can be found in the Care Plan section)  Acute Rehab PT Goals Patient Stated Goal: to walk later PT Goal Formulation: With patient Time For Goal Achievement: 03/29/22 Potential to Achieve Goals: Good    Frequency Min 3X/week     Co-evaluation               AM-PAC PT "6 Clicks" Mobility  Outcome Measure Help needed turning from  your back to your side while in a flat bed without using bedrails?: A Little Help needed moving from lying on your back to sitting on the side of a flat bed without using bedrails?: A Lot Help needed moving to and from a bed to a chair (including a wheelchair)?: A Little Help needed standing up from a chair using your arms (e.g., wheelchair or bedside chair)?: Total Help needed to walk in hospital room?: Total Help needed climbing 3-5 steps with a railing? : Total 6 Click Score: 11    End of Session   Activity Tolerance: Patient tolerated treatment well Patient left: in chair;with  call bell/phone within reach;with chair alarm set Nurse Communication: Mobility status;Other (comment) (BP) PT Visit Diagnosis: Unsteadiness on feet (R26.81);Muscle weakness (generalized) (M62.81);Difficulty in walking, not elsewhere classified (R26.2)    Time: 3559-7416 PT Time Calculation (min) (ACUTE ONLY): 19 min   Charges:   PT Evaluation $PT Eval Moderate Complexity: 1 Mod          Moishe Spice, PT, DPT Acute Rehabilitation Services  Office: [phone]   Orvan Falconer 03/15/2022, 8:40 AM

## 2022-03-15 NOTE — Progress Notes (Signed)
Occupational Therapy Treatment Patient Details Name: Keith [name] MRN: [id number] DOB: 1966/02/26 Today's Date: 03/15/2022   History of present illness Pt is a 56 y.o. male who presented 03/09/22 with generalized weakness and bouts of diarrhea. Found to have Salmonella and EPEC in stool studies and suffered ARF. HD catheter placed 12/5, L arm brachiocephalic fistula 21/1. PMHx: R BKA, L AKA, AIDS, CVA, CAD, MI, HTN, CKD, DM2, PVD, CKD, HLD   OT comments  Pt feeling weak upon OT's arrival, finishing eating breakfast. Completed two grooming activities and then assisted pt back to bed. Pt minimally conversing, focused on feeling weak. Reports he was independent in ADLs prior to admission and walking with prostheses and a rollator. Presents with impaired cognition, decreased safety awareness in addition to generalized weakness. Recommending SNF for further rehab.    Recommendations for follow up therapy are one component of a multi-disciplinary discharge planning process, led by the attending physician.  Recommendations may be updated based on patient status, additional functional criteria and insurance authorization.    Follow Up Recommendations  Skilled nursing-short term rehab (<3 hours/day)     Assistance Recommended at Discharge Frequent or constant Supervision/Assistance  Patient can return home with the following  Two people to help with walking and/or transfers;A lot of help with bathing/dressing/bathroom;Assistance with cooking/housework;Direct supervision/assist for medications management;Direct supervision/assist for financial management;Assist for transportation;Help with stairs or ramp for entrance   Equipment Recommendations  Other (comment) (defer to next venue)    Recommendations for Other Services      Precautions / Restrictions Precautions Precautions: Fall;Other (comment) Precaution Comments: enteric; R BKA, L AKA with bil prostheses Restrictions Weight Bearing  Restrictions: No       Mobility Bed Mobility Overal bed mobility: Needs Assistance Bed Mobility: Rolling Rolling: Supervision         General bed mobility comments: pt rolled from chair back to bed    Transfers   Equipment used: None Transfers: Bed to chair/wheelchair/BSC             General transfer comment: pt rolled from chair to bed despite cues for lateral scoot, eager to return to supine     Balance Overall balance assessment: Needs assistance   Sitting balance-Leahy Scale: Fair Sitting balance - Comments: in chair                                   ADL either performed or assessed with clinical judgement   ADL Overall ADL's : Needs assistance/impaired Eating/Feeding: Independent;Sitting   Grooming: Set up;Wash/dry hands;Wash/dry face;Sitting   Upper Body Bathing: Minimal assistance;Sitting   Lower Body Bathing: Maximal assistance;Bed level   Upper Body Dressing : Minimal assistance;Sitting   Lower Body Dressing: Maximal assistance;Bed level       Toileting- Clothing Manipulation and Hygiene: Total assistance;Bed level              Extremity/Trunk Assessment Upper Extremity Assessment Upper Extremity Assessment: Generalized weakness;Overall WFL for tasks assessed   Lower Extremity Assessment Lower Extremity Assessment: Defer to PT evaluation RLE Deficits / Details: R BKA; generalized weakness LLE Deficits / Details: L AKA, generalized weakness   Cervical / Trunk Assessment Cervical / Trunk Assessment: Other exceptions (obesity)    Vision Patient Visual Report: No change from baseline     Perception     Praxis      Cognition Arousal/Alertness: Awake/alert Behavior During Therapy: Flat affect Overall Cognitive Status:  No family/caregiver present to determine baseline cognitive functioning                                 General Comments: pt repeating, I can't answer questions, I feel weak         Exercises      Shoulder Instructions       General Comments BP soft but stable, pt reporting mild lightheadedness    Pertinent Vitals/ Pain       Pain Assessment Pain Assessment: Faces Faces Pain Scale: Hurts a little bit Pain Location: grimacing with general mobility Pain Descriptors / Indicators: Grimacing Pain Intervention(s): Monitored during session, Repositioned  Home Living Family/patient expects to be discharged to:: Other (Comment) (mother and brother) Living Arrangements: Parent;Other relatives                               Additional Comments: Living in a motel with level entry and tub/shower currently. Has rollator, 3in1, w/c, and tub bench. Per pt, he works and his mother and brother work opposite shifts so 24/7 care is available      Prior Functioning/Environment              Frequency  Min 2X/week        Progress Toward Goals  OT Goals(current goals can now be found in the care plan section)     Acute Rehab OT Goals OT Goal Formulation: With patient Time For Goal Achievement: 03/29/22 Potential to Achieve Goals: Good ADL Goals Pt Will Perform Lower Body Bathing: with min assist;sit to/from stand;sitting/lateral leans Pt Will Perform Lower Body Dressing: with min assist;sit to/from stand;sitting/lateral leans Pt Will Transfer to Toilet: with min assist;stand pivot transfer;bedside commode Pt Will Perform Toileting - Clothing Manipulation and hygiene: with min assist;sitting/lateral leans;sit to/from stand Additional ADL Goal #1: Pt will complete bed mobility with min assist in preparation for ADLs. Additional ADL Goal #2: Pt will don prostheses with min assist.  Plan      Co-evaluation                 AM-PAC OT "6 Clicks" Daily Activity     Outcome Measure   Help from another person eating meals?: None Help from another person taking care of personal grooming?: A Little Help from another person toileting, which  includes using toliet, bedpan, or urinal?: Total Help from another person bathing (including washing, rinsing, drying)?: A Lot Help from another person to put on and taking off regular upper body clothing?: A Little Help from another person to put on and taking off regular lower body clothing?: A Lot 6 Click Score: 15    End of Session    OT Visit Diagnosis: Muscle weakness (generalized) (M62.81);Other symptoms and signs involving cognitive function   Activity Tolerance Patient limited by fatigue   Patient Left in bed;with call bell/phone within reach;with bed alarm set   Nurse Communication          Time: 1040-1055 OT Time Calculation (min): 15 min  Charges: OT General Charges $OT Visit: 1 Visit OT Evaluation $OT Eval Moderate Complexity: [place], OTR/L Acute Rehabilitation Services Office: [phone]   Malka So 03/15/2022, 11:34 AM

## 2022-03-15 NOTE — NC FL2 (Addendum)
[place] LEVEL OF CARE FORM     IDENTIFICATION  Patient Name: Keith [name] Birthdate: [birth year] Sex: male Admission Date (Current Location): 03/09/2022  [facility] and [place] Number:  Herbalist and Address:  [facility] [place] [place] [address], [place], [place] [address]      Provider Number: [id number]  Attending Physician Name and Address:  Patrecia Pour, MD  Relative Name and Phone Number:   Jaedon Siler [facility]) [phone]     Current Level of Care: Hospital Recommended Level of Care: [place] Prior Approval Number:    Date Approved/Denied:   PASRR Number: [id number]  Discharge Plan: SNF    Current Diagnoses: Patient Active Problem List   Diagnosis Date Noted   AKI (acute kidney injury) ([place]) 03/09/2022   Gallbladder anomaly 03/09/2022   Fall at home, initial encounter 12/29/2021   Acute cystitis 12/29/2021   History of anemia due to chronic kidney disease 12/29/2021   Nonketotic hyperglycinemia, type II ([place]) 12/18/2021   Asymptomatic bacteriuria 12/18/2021   Type 2 diabetes mellitus with hyperosmolar nonketotic hyperglycemia ([place]) 12/06/2021   Hyperglycemia 12/06/2021   BPH (benign prostatic hyperplasia)    GERD (gastroesophageal reflux disease)    Headache 08/22/2021   Anxiety 02/13/2021   Healthcare maintenance 02/13/2021   History of left above knee amputation ([place]) 02/07/2021   Wound dehiscence 01/03/2021   Coagulopathy ([place]) 12/22/2020   CAD (coronary artery disease) 12/22/2020   Obesity 12/22/2020   Ulcer of heel due to diabetes mellitus ([place])    Anemia    Hyperglycemia due to diabetes mellitus ([place]) 08/29/2020   Type 2 diabetes mellitus with hyperlipidemia (HCC)    PAD (peripheral artery disease) ([place])    Uncontrolled type 2 diabetes mellitus with hyperglycemia, with long-term current use of insulin with renal complication 62/94/7654   Elevated glucose 01/14/2019   Hx of BKA, right ([place])  01/14/2019   Pressure injury of skin 11/11/2018   CKD (chronic kidney disease) stage 4, GFR 15-29 ml/min (HCC) 11/10/2018   Amputation stump infection ([place]) 10/28/2018   Type II diabetes mellitus with renal manifestations ([place]) 08/07/2018   Uncontrolled type 2 diabetes mellitus with hyperosmolar nonketotic hyperglycemia ([place]) 08/07/2018   Non-pressure chronic ulcer of right calf limited to breakdown of skin ([place]) 07/06/2018   Type 2 diabetes mellitus without complication, without long-term current use of insulin ([place]) 06/15/2018   Chronic low back pain without sciatica 06/15/2018   Idiopathic chronic venous hypertension of left lower extremity with ulcer and inflammation (HCC)    Venous stasis ulcer of left calf limited to breakdown of skin without varicose veins ([place]) 04/23/2018   Acquired absence of right leg below knee ([place]) 06/13/2017   Diabetic polyneuropathy associated with type 2 diabetes mellitus ([place]) 01/23/2017   Diarrhea 09/28/2016   Nausea vomiting and diarrhea 09/28/2016   Onychomycosis of multiple toenails with type 2 diabetes mellitus ([place]) 08/29/2015   MRSA carrier 04/20/2014   Penile wart 02/22/2014   HIV disease ([place])    Insulin-requiring or dependent type II diabetes mellitus ([place]) 02/04/2014   Dental anomaly 11/20/2012   Arthritis of right knee 02/23/2012   Hyperlipidemia 11/10/2011   Hyponatremia 11/10/2011   Chronic pain 08/07/2011   Meralgia paraesthetica 04/23/2011   ERECTILE DYSFUNCTION 08/22/2008   HTN (hypertension) 05/19/2008    Orientation RESPIRATION BLADDER Height & Weight     Self, Time, Situation, Place  Normal Continent Weight: 250 lb 7.1 oz (113.6 kg) (bed scale) Height:  5\' 10"  (177.8 cm)  BEHAVIORAL SYMPTOMS/MOOD NEUROLOGICAL BOWEL NUTRITION STATUS      Continent Diet (Please see discharge summary)  AMBULATORY STATUS COMMUNICATION OF NEEDS Skin   Extensive Assist Verbally Other (Comment) (Wound/Incision LDAs,Incision  closed,arm,L,Wound/Incision open or dehiced non-pressure wound,buttocks,R)                       Personal Care Assistance Level of Assistance  Bathing, Feeding, Dressing Bathing Assistance: Maximum assistance Feeding assistance: Independent (can feed self) Dressing Assistance: Maximum assistance     Functional Limitations Info  Sight, Hearing, Speech Sight Info: Adequate (WDL) Hearing Info: Adequate Speech Info: Adequate    SPECIAL CARE FACTORS FREQUENCY  PT (By licensed PT), OT (By licensed OT)     PT Frequency: 5x min weekly OT Frequency: 5x min weekly            Contractures Contractures Info: Not present    Additional Factors Info  Code Status, Allergies, Psychotropic, Insulin Sliding Scale, Isolation Precautions Code Status Info: FULL Allergies Info: Elemental Sulfur,Metformin And Related,Zetia (ezetimibe),Sulfa Antibiotics Psychotropic Info: escitalopram (LEXAPRO) tablet 20 mg daily Insulin Sliding Scale Info: insulin aspart (novoLOG) injection 0-9 Units 3 times daily with meals  Isolation precautions info: UV disinfection added 03/10/2022     Current Medications (03/15/2022):  This is the current hospital active medication list Current Facility-Administered Medications  Medication Dose Route Frequency Provider Last Rate Last Admin   acetaminophen (TYLENOL) tablet 650 mg  650 mg Oral Q6H PRN Reubin Milan, MD       Or   acetaminophen (TYLENOL) suppository 650 mg  650 mg Rectal Q6H PRN Reubin Milan, MD       aspirin EC tablet 81 mg  81 mg Oral Daily Reubin Milan, MD   81 mg at 03/15/22 0943   atorvastatin (LIPITOR) tablet 80 mg  80 mg Oral Daily Patrecia Pour, MD   80 mg at 03/15/22 0943   bictegravir-emtricitabine-tenofovir AF (BIKTARVY) 50-200-25 MG per tablet 1 tablet  1 tablet Oral Q supper Paytes, Austin A, RPH       calcium acetate (PHOSLO) capsule 1,334 mg  1,334 mg Oral TID WC Corliss Parish, MD   1,334 mg at 03/15/22 1221    Chlorhexidine Gluconate Cloth 2 % PADS 6 each  6 each Topical Daily Elodia Florence., MD   6 each at 03/15/22 0944   clopidogrel (PLAVIX) tablet 75 mg  75 mg Oral Daily Reubin Milan, MD   75 mg at 03/15/22 5701   Darbepoetin Alfa (ARANESP) injection 60 mcg  60 mcg Subcutaneous Q Mon-1800 Corliss Parish, MD   60 mcg at 03/11/22 2115   escitalopram (LEXAPRO) tablet 20 mg  20 mg Oral Daily Reubin Milan, MD   20 mg at 03/15/22 0944   finasteride (PROSCAR) tablet 5 mg  5 mg Oral Daily Reubin Milan, MD   5 mg at 03/15/22 0943   insulin aspart (novoLOG) injection 0-9 Units  0-9 Units Subcutaneous TID WC Reubin Milan, MD   2 Units at 03/15/22 1222   ondansetron (ZOFRAN) tablet 4 mg  4 mg Oral Q6H PRN Reubin Milan, MD       Or   ondansetron [facility]) injection 4 mg  4 mg Intravenous Q6H PRN Reubin Milan, MD       tamsulosin [facility]) capsule 0.4 mg  0.4 mg Oral QPC supper Claudia Desanctis, MD   0.4 mg at 03/14/22  1710     Discharge Medications: Please see discharge summary for a list of discharge medications.  Relevant Imaging Results:  Relevant Lab Results:   Additional Information [id number]   Patient is HD at [facility] on TTS 12:15 chair time. Pt can start on Tuesday and will need to arrive at 11:15 to complete paperwork prior to treatment.   Milas Gain, LCSWA

## 2022-03-15 NOTE — Progress Notes (Signed)
[facility] PROGRESS NOTE  Keith [name] (DOB: 1965-05-30) CMK:[id number] [name], NP  Brief Narrative: Keith [name] is a 56 y.o. male with a history of stage IV CKD, HIV disease, IDT2DM, PVD s/p bilateral LE amputations, BPH who presented to the ED on 03/09/2022 with weakness and diarrhea found to have Salmonella and EPEC in stool studies. He was treated supportively, though suffered ARF for which nephrology was consulted and hemodialysis is initiated.   Subjective: Ate fine today, sleeping and his only complaint is that he feels diffusely weak. No pain, has not taken pain medications. No nausea vomiting or diarrhea.  Objective: BP 94/76 (BP Location: Right Arm)   Pulse 73   Temp 99.8 F (37.7 C) (Oral)   Resp 18   Ht 5\' 10"  (1.778 m)   Wt 113.6 kg Comment: bed scale  SpO2 99%   BMI 35.93 kg/m   Gen: No distress, resting quietly on left side Pulm: Clear, nonlabored  CV: RRR, no pitting dependent edema.  GI: Soft, NT, ND, +BS  Neuro: Sleeping but rouses easily, oriented, conversant. No new focal deficits. Ext: Warm, BL LE amputation sites normal.  Skin: No tenderness over IJ or AVF which has a thrill. No new rashes, lesions or ulcers on visualized skin   Assessment & Plan: Salmonella gastroenteritis, EPEC colonization: Suspected per ID.  - Treated with cipro/flagyl x1 and then CTX x3 days. No further Tx needed per ID. Stool output much better.   - Enteric precautions  ARF on stage IV CKD, now ESRD:  - Continue HD per nephrology thru [facility] placed 12/5 by Dr. Serafina Royals. Next 12/9 as inpatient. [facility] spot [facility].  - s/p left brachiocephalic fistula by Dr. Virl Cagey 12/7. Follow up in 4 weeks.   BPH with acute urinary retention:  - Making urine now. - Continue tamsulosin, finasteride  HIV disease: CD4 count is 129, VL undetectable - Changed ART to biktarvy, d/w ID, Dr. Linus Salmons now that patient is ESRD.  - Follow up with [facility], Dr. Candiss Norse, arranged 1/8 at  10:00am.  IDT2DM:  - Continue SSI, will need to augment as po intake increases though renal dysfunction clearly contributing to lower insulin needs than previously.  HTN:   - Hold hydralazine, norvasc, coreg, HCTZ, losartan for now. BP still normal.  Depression, anxiety:  - Continue lexapro  Neuropathy:  - Continued reduced dose lyrica, will stop for now with waxing/waning drowsiness.   Hypokalemia:  - Tx per nephrology  Hypocalcemia:  - Tx per nephrology  PVD: s/p left AKA, right BKA.  - Follow up with [name] per routine  CAD: s/p DES 2021.  - Continue ASA, plavix, statin (LFTs ok)  Debility: May benefit from SNF level rehabilitation which is being pursued. His ultimate goal is returning to independence, wants to be home, but amenable to rehabilitation at this time. Discussing with TOC, family, patient, and care team regularly.  Patrecia Pour, MD [facility] [email or web address] 03/15/2022, 1:24 PM

## 2022-03-15 NOTE — Progress Notes (Signed)
Palliative Medicine Inpatient Follow Up Note HPI: 56 y.o. male  with past medical history of CAD, CKD (stage 4), HIV (Dovato and Pifeltro - intermittent compliance), DM2, bialteral leg amputation, BPH, and GERD admitted on 03/09/2022 with diarrhea.   Patient is being treated for AKI r/t diarrheal illness from salmonella and epec. Diarrhea has improved with treatment of ceftriaxone and cipro. Patient remains in AKI and has agreed to proceed with HD. Outpatient HD plans in progress with TOC following closely.   PMT was consulted in light of patient's significant illness and new need for dialysis.  Today's Discussion 03/15/2022  *Please note that this is a verbal dictation therefore any spelling or grammatical errors are due to the "[place] [employer]" system interpretation.  Chart reviewed inclusive of vital signs, progress notes, laboratory results, and diagnostic images.   I met with patients RN, Ayaba who is at bedside w/o significant concern helping Brendan order his breakfast.   I met at bedside with Legrand Como this morning. He was sitting up in the chair. He does close his eyes though is easily arouable and directable to conversations tone. I introduced myself and reviewed my role as a Palliative Care provider.   Nicholes and I reviewed the reason for hospitalization as he endorses that he was having frequent and loose bowels. We discussed how this pushed him into kidney failure requiring HD. Further reviewed his wishes and goals for the future.   Nader shares that he eventually wants to get back to living by himself. He reviews that he does have three children though they are not overtly involved. His closest relationship is with his brother, Christia Reading. He does name Octavia Bruckner as his Air traffic controller. I asked him if he would be willing to get this in writing and he agrees to complete advance directives with me. The patients living will and HCPOA forms complete in my presence.   Created  space and opportunity for patient to explore thoughts feelings and fears regarding current medical situation. He expresses little regarding his present situation other than wanting to be more independent again.   We did broach the topic of code status with all that Iseah has in terms of co-morbidities and he is vocal about wanting  ACLS mediated CPR and intubation if needed.   Questions and concerns addressed/Palliative Support Provided.   Objective Assessment: Vital Signs Vitals:   03/15/22 0416 03/15/22 0726  BP: 115/80 94/76  Pulse: 91 73  Resp: 19 18  Temp: 99 F (37.2 C) 99.8 F (37.7 C)  SpO2: 99% 99%    Intake/Output Summary (Last 24 hours) at 03/15/2022 1215 Last data filed at 03/15/2022 0950 Gross per 24 hour  Intake 480 ml  Output 1350 ml  Net -870 ml   Last Weight  Most recent update: 03/14/2022 10:45 PM    Weight  113.6 kg (250 lb 7.1 oz)            Gen:  Middle aged 1 M chronically ill appearing HEENT: moist mucous membranes CV: Regular rate and rhythm  PULM:  On RA breathing is even and nonlabored ABD: soft/nontender  EXT:  R BKA, L AKA Neuro: Alert and oriented x3   SUMMARY OF RECOMMENDATIONS   Full Code/ Full Scope of Care  Advance Directives completed at bedside with patient --> Appreciate Chaplain completing notary process  Patient would like his brother, Octavia Bruckner to be his surrogate decision maker  Goals to transition out of the hospital and eventually be more independent/living  alone  Ongoing Palliative Support  Billing based on MDM: High ______________________________________________________________________________________ [place] Palliative Medicine Team Team Cell Phone: [phone] Please utilize secure chat with additional questions, if there is no response within 30 minutes please call the above phone number  Palliative Medicine Team providers are available by phone from 7am to 7pm daily and can be reached through  the team cell phone.  Should this patient require assistance outside of these hours, please call the patient's attending physician.

## 2022-03-15 NOTE — Progress Notes (Signed)
[facility] Progress Note   Subjective:  Had second HD yest-  removed 1300-  BP is a little soft and he is more somnolent -  notice he is on lyrica 50 BID    56 y.o. male with HIV, CAD with hx STEMI, CKD stage IV, DM type 2, HTN, and prior CVA who presented  with generalized weakness and watery, non-bloody diarrhea.  He was found to have creatinine markedly elevated at 12.59.   baseline Cr 3.5 -4.0.  past meds include losartan and HCTZ.  He was seen by [facility] inpatient in July for AKI felt 2/2 ATN from sustained pre-renal insults.  He was last seen in hospital follow-up in July by Dr. Joelyn Oms- assessed as having CKD stage IV and a high risk of progression to ESRD; felt to only be a candidate for in-center hemo when dialysis was needed.  He was  lost to follow-up.   He lives in a motel.  Would want dialysis if needed    Intake/Output Summary (Last 24 hours) at 03/15/2022 1207 Last data filed at 03/15/2022 0950 Gross per 24 hour  Intake 480 ml  Output 1350 ml  Net -870 ml    Vitals:  Vitals:   03/14/22 2245 03/14/22 2256 03/15/22 0416 03/15/22 0726  BP:  (!) 141/91 115/80 94/76  Pulse:  78 91 73  Resp:  17 19 18   Temp:  98.5 F (36.9 C) 99 F (37.2 C) 99.8 F (37.7 C)  TempSrc:  Oral Oral Oral  SpO2:  97% 99% 99%  Weight: 113.6 kg     Height:         Physical Exam:  General: adult male in bed in NAD HEENT: NCAT  Eyes: EOMI sclera anicteric Neck: supple trachea midline  Heart: S1S2 no rub Lungs: clear but reduced; normal work of breathing on room air  Abdomen: soft/obese/nt Extremities: bilateral BKA's Skin: no rash on extremities exposed  Neuro: alert and oriented x 3 provides hx and follows commands Psych normal mood and affect TDC in place - also avf good bruit   Medications reviewed   Labs:     Latest Ref Rng & Units 03/15/2022    2:57 AM 03/14/2022    7:44 AM 03/13/2022    2:19 AM  BMP  Glucose 70 - 99 mg/dL 193  174  221   BUN 6 - 20 mg/dL 42  69   67   Creatinine 0.61 - 1.24 mg/dL 7.43  10.06  9.70   Sodium 135 - 145 mmol/L 135  134  134   Potassium 3.5 - 5.1 mmol/L 3.3  3.1  3.0   Chloride 98 - 111 mmol/L 98  104  98   CO2 22 - 32 mmol/L 22  17  19    Calcium 8.9 - 10.3 mg/dL 7.1  6.8  6.9      Assessment/Plan:   # AKI  - progression to ESRD.   - He  is agreeable to start HD-  s/p TDC thru IR and for first HD 12/5, for second on 12/7  get VVS involved-  s/p AVF on 12/7-  thank you  and [facility] for OP HD- has been accepted at [facility] for third HD tomorrow 12/9  - Note that He is felt to be only a candidate for in-center HD - he is currently living in a motel . He asks about transplant-  these discussions will be ongoing-  not sure his social support with pass  the test     #  HTN  Controlled-  no meds-  does not seem too volume overloaded    # BPH  - continue finasteride -  he had relief with the foley - started flomax-  will need to attempt to remove and do voiding trial- done 12/6   # Hyponatremia - Mild and setting of AKI, dehydration - improving    # Hypokalemia - potassium is better-  high K bath      # Anemia CKD  - No acute indication for PRBC's -  now in the 9's-  iron OK -  giving ESA    # HIV  - has had for over 20 years per charting   Bones-   PTH 225 -  phos 7.7-  added phoslo   Dispo-  has perm access and OP center-  could be discharged as early as tomorrow after HD- is more somnolent-  held lyrica and his social situation is not yet set either -  now thinking will need placement   Louis Meckel, MD 03/15/2022 12:07 PM

## 2022-03-15 NOTE — TOC Progression Note (Addendum)
Transition of Care [facility]) - Progression Note    Patient Details  Name: Keith [name] MRN: [id number] Date of Birth: [birth year]  Transition of Care [facility]) CM/SW [place], [place] Phone Number: 03/15/2022, 12:56 PM  Clinical Narrative:     CSW received consult for possible SNF placement at time of discharge. CSW spoke with patient regarding PT recommendation of SNF placement at time of discharge. PTA patient was from a motel with support of his mother and brother. Patient expressed understanding of PT recommendation and is agreeable to SNF placement at time of discharge. Patient gave CSW permission to fax out initial referral near the [facility] area for possible SNF placement. CSW discussed insurance authorization process.Patient gave permission to speak with his brother. Patients brother also in agreement for patient to go to SNF when patient is medically ready for dc.CSW started insurance authorization for patient reference number # [id number].CSW will need to add patients SNF choice to patients insurance authorization.No further questions reported at this time. CSW to continue to follow and assist with discharge planning needs.     Expected Discharge Plan: [place] Barriers to Discharge: Continued Medical Work up  Expected Discharge Plan and Services Expected Discharge Plan: [place] In-house Referral: Clinical Social Work Discharge Planning Services: CM Consult Post Acute Care Choice: [place] Living arrangements for the past 2 months: Hotel/Motel                   DME Agency: NA                   Social Determinants of Health (SDOH) Interventions    Readmission Risk Interventions    03/13/2022    4:18 PM 01/01/2022   11:54 AM 10/10/2021    1:31 PM  Readmission Risk Prevention Plan  Transportation Screening Complete Complete Complete  Medication Review (RN Care Manager) Referral to Pharmacy Referral to Pharmacy  Complete  PCP or Specialist appointment within 3-5 days of discharge  Complete Complete  HRI or [place] Complete Complete Complete  SW Recovery Care/Counseling Consult Complete Complete Complete  Palliative Care Screening Not Applicable Not Applicable Not [place]  Not Applicable Not Applicable

## 2022-03-15 NOTE — Progress Notes (Addendum)
This chaplain is present with the Pt., notary, and witnesses for the notarizing of the Pt. Advance Directive:  HCPOA and Living Will.  The Pt named Keith [name] as his healthcare agent.   The chaplain gave the Pt. the original AD along with a copy. The chaplain scanned the Pt. AD into his EMR.  **With the Pt. permission, the chaplain placed the Pt. AD and copy in the Pt. personal belongings bag.  The chaplain is available for F/U spiritual care as needed.  Chaplain Sallyanne Kuster [phone]

## 2022-03-15 NOTE — TOC Benefit Eligibility Note (Signed)
Patient Teacher, English as a foreign language completed.    The patient is currently admitted and upon discharge could be taking Biktarvy.  The current 30 day co-pay is $0.00.   The patient is insured through [facility] Part D

## 2022-03-16 LAB — GLUCOSE, CAPILLARY
Glucose-Capillary: 198 mg/dL — ABNORMAL HIGH (ref 70–99)
Glucose-Capillary: 186 mg/dL — ABNORMAL HIGH (ref 70–99)
Glucose-Capillary: 145 mg/dL — ABNORMAL HIGH (ref 70–99)
Glucose-Capillary: 174 mg/dL — ABNORMAL HIGH (ref 70–99)

## 2022-03-16 LAB — CBC
HCT: 29.5 % — ABNORMAL LOW (ref 39.0–52.0)
Hemoglobin: 9.8 g/dL — ABNORMAL LOW (ref 13.0–17.0)
MCH: 27.3 pg (ref 26.0–34.0)
MCHC: 33.2 g/dL (ref 30.0–36.0)
MCV: 82.2 fL (ref 80.0–100.0)
Platelets: 289 10*3/uL (ref 150–400)
RBC: 3.59 MIL/uL — ABNORMAL LOW (ref 4.22–5.81)
RDW: 13.1 % (ref 11.5–15.5)
WBC: 8.9 10*3/uL (ref 4.0–10.5)
nRBC: 0 % (ref 0.0–0.2)

## 2022-03-16 LAB — RENAL FUNCTION PANEL
Albumin: 2.3 g/dL — ABNORMAL LOW (ref 3.5–5.0)
Anion gap: 14 (ref 5–15)
BUN: 51 mg/dL — ABNORMAL HIGH (ref 6–20)
CO2: 21 mmol/L — ABNORMAL LOW (ref 22–32)
Calcium: 7.3 mg/dL — ABNORMAL LOW (ref 8.9–10.3)
Chloride: 98 mmol/L (ref 98–111)
Creatinine, Ser: 9.44 mg/dL — ABNORMAL HIGH (ref 0.61–1.24)
GFR, Estimated: 6 mL/min — ABNORMAL LOW (ref >60)
Glucose, Bld: 181 mg/dL — ABNORMAL HIGH (ref 70–99)
Phosphorus: 5.3 mg/dL — ABNORMAL HIGH (ref 2.5–4.6)
Potassium: 2.9 mmol/L — ABNORMAL LOW (ref 3.5–5.1)
Sodium: 133 mmol/L — ABNORMAL LOW (ref 135–145)

## 2022-03-16 MED ORDER — INSULIN GLARGINE-YFGN 100 UNIT/ML ~~LOC~~ SOLN
5.0000 [IU] | Freq: Every day | SUBCUTANEOUS | Status: DC
  Administered 2022-03-16 – 2022-03-17 (×2): 5 [IU] via SUBCUTANEOUS
  Filled 2022-03-16 (×2): qty 0.05

## 2022-03-16 MED ORDER — HEPARIN SODIUM (PORCINE) 1000 UNIT/ML IJ SOLN
INTRAMUSCULAR | Status: AC
  Filled 2022-03-16: qty 4

## 2022-03-16 NOTE — Progress Notes (Signed)
[facility] Progress Note   Subjective:  BP higher/better today more alert -  due for HD later today     56 y.o. male with HIV, CAD with hx STEMI, CKD stage IV, DM type 2, HTN, and prior CVA who presented  with generalized weakness and watery, non-bloody diarrhea.  He was found to have creatinine markedly elevated at 12.59.   baseline Cr 3.5 -4.0.  past meds include losartan and HCTZ.  He was seen by [facility] in July for AKI felt 2/2 ATN from sustained pre-renal insults.  He was last seen in hospital follow-up in July by Dr. Joelyn Oms- assessed as having CKD stage IV and a high risk of progression to ESRD; felt to only be a candidate for in-center hemo when dialysis was needed.  He was  lost to follow-up.   He lives in a motel.  Would want dialysis if needed    Intake/Output Summary (Last 24 hours) at 03/16/2022 1204 Last data filed at 03/16/2022 0900 Gross per 24 hour  Intake 900 ml  Output --  Net 900 ml    Vitals:  Vitals:   03/16/22 0031 03/16/22 0503 03/16/22 0804 03/16/22 1122  BP: (!) 115/51 128/61 122/73 (!) 140/72  Pulse: 66 75 65 70  Resp: 18 16 18 17   Temp: 98.2 F (36.8 C) 97.6 F (36.4 C) 97.8 F (36.6 C) 97.7 F (36.5 C)  TempSrc: Oral Oral Oral Axillary  SpO2: 99% 98% 98% 98%  Weight:      Height:         Physical Exam:  General: adult male in bed in NAD HEENT: NCAT  Eyes: EOMI sclera anicteric Neck: supple trachea midline  Heart: S1S2 no rub Lungs: clear but reduced; normal work of breathing on room air  Abdomen: soft/obese/nt Extremities: bilateral BKA's Skin: no rash on extremities exposed  Neuro: alert and oriented x 3 provides hx and follows commands Psych normal mood and affect TDC in place - also avf good bruit   Medications reviewed   Labs:     Latest Ref Rng & Units 03/15/2022    2:57 AM 03/14/2022    7:44 AM 03/13/2022    2:19 AM  BMP  Glucose 70 - 99 mg/dL 193  174  221   BUN 6 - 20 mg/dL 42  69  67   Creatinine 0.61  - 1.24 mg/dL 7.43  10.06  9.70   Sodium 135 - 145 mmol/L 135  134  134   Potassium 3.5 - 5.1 mmol/L 3.3  3.1  3.0   Chloride 98 - 111 mmol/L 98  104  98   CO2 22 - 32 mmol/L 22  17  19    Calcium 8.9 - 10.3 mg/dL 7.1  6.8  6.9      Assessment/Plan:   # AKI  - progression to ESRD.   - He  is agreeable to start HD-  s/p TDC thru IR and for first HD 12/5, for second on 12/7  get VVS involved-  s/p AVF on 12/7-  thank you  and [facility] for OP HD- has been accepted at [place] for third HD today, then tx #4 on Tuesday either here or at OP center  - Note that He is felt to be only a candidate for in-center HD - he is currently living in a motel . He asks about transplant-  these discussions will be ongoing-  not sure his social support with pass the test    #  HTN  Controlled-  no meds-  does not seem too volume overloaded    # BPH  - continue finasteride -  he had relief with the foley - started flomax-  will need to attempt to remove and do voiding trial- done 12/6   # Hyponatremia - Mild and setting of AKI, dehydration - improving    # Hypokalemia - potassium is better-  high K bath      # Anemia CKD  - No acute indication for PRBC's -  now in the 9's-  iron OK -  giving ESA    # HIV  - has had for over 20 years per charting   Bones-   PTH 225 -  phos 7.7-  added phoslo -  last in the 5's  Dispo-  has perm access and OP center-  could be discharged as early as today after HD- is more somnolent-  held lyrica and his social situation is not yet set either -  now thinking will need placement   Louis Meckel, MD 03/16/2022 12:04 PM

## 2022-03-16 NOTE — Progress Notes (Signed)
   Palliative Medicine Inpatient Follow Up Note HPI: 56 y.o. male  with past medical history of CAD, CKD (stage 4), HIV (Dovato and Pifeltro - intermittent compliance), DM2, bialteral leg amputation, BPH, and GERD admitted on 03/09/2022 with diarrhea.   Patient is being treated for AKI r/t diarrheal illness from salmonella and epec. Diarrhea has improved with treatment of ceftriaxone and cipro. Patient remains in AKI and has agreed to proceed with HD. Outpatient HD plans in progress with TOC following closely.   PMT was consulted in light of patient's significant illness and new need for dialysis.  Today's Discussion 03/16/2022  *Please note that this is a verbal dictation therefore any spelling or grammatical errors are due to the "[place]" system interpretation.  Chart reviewed inclusive of vital signs, progress notes, laboratory results, and diagnostic images.   I met with Keith [name] at bedside this morning. He was far more alert and oriented today. He recognizes over the past few days he had been more withdrawn though today he shares that he is feeling "much better". Stool burden is less today.   Reviewed the advance directives completed yesterday to ensure understanding.   Discussed the plan for Keith [name] in terms of placement and ongoing iHD. Discussed that SNF is not long term and is used to optimize strength. He lives in a hotel otherwise where he is hopeful to go back to once he is strong enough.   Questions and concerns addressed/Palliative Support Provided.   Objective Assessment: Vital Signs Vitals:   03/16/22 0804 03/16/22 1122  BP: 122/73 (!) 140/72  Pulse: 65 70  Resp: 18 17  Temp: 97.8 F (36.6 C) 97.7 F (36.5 C)  SpO2: 98% 98%    Intake/Output Summary (Last 24 hours) at 03/16/2022 1424 Last data filed at 03/16/2022 0900 Gross per 24 hour  Intake 900 ml  Output --  Net 900 ml    Last Weight  Most recent update: 03/14/2022 10:45 PM    Weight  113.6 kg  (250 lb 7.1 oz)            Gen:  Middle aged 47 M chronically ill appearing HEENT: moist mucous membranes CV: Regular rate and rhythm  PULM:  On RA breathing is even and nonlabored ABD: soft/nontender  EXT:  R BKA, L AKA Neuro: Alert and oriented x3   SUMMARY OF RECOMMENDATIONS   Full Code/ Full Scope of Care  Advance Directives completed 12/9  Plan for transition to [facility] once transportation to and from HD can be arranged  Billing based on MDM: High ______________________________________________________________________________________ [place] Team Team Cell Phone: [phone] Please utilize secure chat with additional questions, if there is no response within 30 minutes please call the above phone number  Palliative Medicine Team providers are available by phone from 7am to 7pm daily and can be reached through the team cell phone.  Should this patient require assistance outside of these hours, please call the patient's attending physician.

## 2022-03-16 NOTE — Progress Notes (Signed)
[facility] PROGRESS NOTE  Keith [name] (DOB: [birth year]) Keith [name] PCP: Keith Francois, NP  Brief Narrative: Keith [name] is a 56 y.o. male with a history of stage IV CKD, HIV disease, IDT2DM, PVD s/p bilateral LE amputations, BPH who presented to the ED on 03/09/2022 with weakness and diarrhea found to have Salmonella and EPEC in stool studies. He was treated supportively, though suffered ARF for which nephrology was consulted and hemodialysis is initiated.   Subjective: Still eating well, much less loose stool day by day, no abd pain. No other complaints and is aware that he is much more alert and interactive, feels much less weak than he did after dialysis yesterday.  Objective: BP 122/73 (BP Location: Right Wrist)   Pulse 65   Temp 97.8 F (36.6 C) (Oral)   Resp 18   Ht 5\' 10"  (1.778 m)   Wt 113.6 kg Comment: bed scale  SpO2 98%   BMI 35.93 kg/m   Gen: More alert, interactive male in no distress Pulm: Nonlabored on room air and clear CV: RRR no pitting edema GI: +BS, soft, NT, ND Neuro: Alert, oriented, moves all extremities. Ext: Warm, dry Skin: TDC without tenderness, no tenderness or enrythema to AVF + thrill.  Assessment & Plan: Salmonella gastroenteritis, EPEC colonization: Suspected per ID.  - Treated with cipro/flagyl x1 and then CTX x3 days. No further Tx needed per ID. Stool output much better.   - Enteric precautions.  ARF on stage IV CKD, now ESRD:  - Continue HD per nephrology thru [facility] placed 12/5 by Dr. Serafina Royals. Next 12/9 as inpatient. [facility].  - s/p left brachiocephalic fistula by Dr. Virl Cagey 12/7. Follow up in 4 weeks.   BPH with acute urinary retention:  - Making urine now. - Continue tamsulosin, finasteride  HIV disease: CD4 count is 129, VL undetectable - Changed ART to biktarvy, d/w ID, Dr. Linus Salmons now that patient is ESRD.  - Follow up with [facility], Dr. Candiss Norse, arranged 1/8 at 10:00am.  IDT2DM:  - Continue SSI, start  glargine 5u daily now that po intake has improved. This is overall still much less than his prescribed outpatient dosing. ?decreased renal clearance vs. nonadherence  HTN:   - Hold hydralazine, norvasc, coreg, HCTZ, losartan for now. BP still normal.  Depression, anxiety:  - Continue lexapro  Neuropathy:  - Continued reduced dose lyrica, will stop for now with waxing/waning drowsiness.   Hypokalemia:  - Tx per nephrology  Hypocalcemia:  - Tx per nephrology  PVD: s/p left AKA, right BKA.  - Follow up with [name] per routine  CAD: s/p DES 2021.  - Continue ASA, plavix, statin (LFTs ok)  Debility: Will benefit from SNF rehabilitation post acute care stay. He is medically stable for discharge once this can be arranged. It seems [facility] transportation will be required for outpatient hemodialysis. Pt has bed offer at [facility] pending insurance authorization.   Patrecia Pour, MD [facility] [email or web address] 03/16/2022, 10:52 AM

## 2022-03-16 NOTE — TOC Progression Note (Signed)
Transition of Care  [facility]) - Progression Note    Patient Details  Name: Keith [name] MRN: [id number] Date of Birth: May 04, 1965  Transition of Care [facility] CM/SW [place], LCSW Phone Number: 03/16/2022, 10:07 AM  Clinical Narrative:     CSW spoke with pt about his bed offer. CSW explained that pt had a bed offer form [facility] however they did not provided transportation to HD. It was discussed that pt could get transportation through [facility] however it would need to be arranged. Facility wanted CSW to verify pt's residence at the hotel. Pt confirmed that he has been living at the hotel for about 3 months and considers it to be a stable placement.  CSW spoke with Kia at [facility] and she confirmed that they can accept pt once transportation has been arranged.  Facility will need to provided to insurance once transportation has been confirmed.  TOC team will continue to assist with discharge planning needs.  Expected Discharge Plan: [place] Barriers to Discharge: Continued Medical Work up  Expected Discharge Plan and Services Expected Discharge Plan: [place] In-house Referral: Clinical Social Work Discharge Planning Services: CM Consult Post Acute Care Choice: [place] arrangements for the past 2 months: Hotel/Motel                   DME Agency: NA                   Social Determinants of Health (SDOH) Interventions    Readmission Risk Interventions   Row Labels 03/13/2022    4:18 PM 01/01/2022   11:54 AM 10/10/2021    1:31 PM  Readmission Risk Prevention Plan   Section Header. No data exists in this row.     Transportation Screening   Complete Complete Complete  Medication Review Press photographer)   Referral to Pharmacy Referral to Pharmacy Complete  PCP or Specialist appointment within 3-5 days of discharge    Complete Complete  HRI or Home Care Consult   Complete Complete Complete  SW Recovery Care/Counseling  Consult   Complete Complete Complete  Palliative Care Screening   Not Applicable Not Applicable Not [place]    Not Applicable Not Applicable

## 2022-03-16 NOTE — Progress Notes (Addendum)
Received patient in bed to unit.  Alert and oriented.  Informed consent signed and in chart.   Treatment initiated: 1440 Treatment completed: 1752  Patient tolerated well.  Transported back to the room  Alert, without acute distress.  Hand-off given to patient's nurse.   Access used: catheter right chest Access issues: none  Total UF removed: 1000  Medication(s) given: none Post HD weight: 115 kg   [facility]  03/16/22 1752  Vitals  Temp 98.1 F (36.7 C)  Temp Source Oral  BP (!) 111/57  MAP (mmHg) 72  ECG Heart Rate 71  Resp 14  Oxygen Therapy  SpO2 100 %  O2 Device Room Air  MEWS Score  MEWS Temp 0  MEWS Systolic 0  MEWS Pulse 0  MEWS RR 0  MEWS LOC 0  MEWS Score 0  MEWS Score Color Nyoka Cowden

## 2022-03-17 LAB — GLUCOSE, CAPILLARY
Glucose-Capillary: 163 mg/dL — ABNORMAL HIGH (ref 70–99)
Glucose-Capillary: 147 mg/dL — ABNORMAL HIGH (ref 70–99)
Glucose-Capillary: 152 mg/dL — ABNORMAL HIGH (ref 70–99)
Glucose-Capillary: 85 mg/dL (ref 70–99)

## 2022-03-17 MED ORDER — INSULIN GLARGINE-YFGN 100 UNIT/ML ~~LOC~~ SOLN
10.0000 [IU] | Freq: Every day | SUBCUTANEOUS | Status: DC
  Administered 2022-03-18: 10 [IU] via SUBCUTANEOUS
  Filled 2022-03-17 (×2): qty 0.1

## 2022-03-17 MED ORDER — INSULIN GLARGINE-YFGN 100 UNIT/ML ~~LOC~~ SOLN
5.0000 [IU] | Freq: Once | SUBCUTANEOUS | Status: AC
  Administered 2022-03-17: 5 [IU] via SUBCUTANEOUS
  Filled 2022-03-17: qty 0.05

## 2022-03-17 NOTE — Progress Notes (Signed)
[facility] Progress Note   Subjective:  HD yest-  removed a liter-  tolerated well-  no new c/o's     56 y.o. male with HIV, CAD with hx STEMI, CKD stage IV, DM type 2, HTN, and prior CVA who presented  with generalized weakness and watery, non-bloody diarrhea.  He was found to have creatinine markedly elevated at 12.59.   baseline Cr 3.5 -4.0.  past meds include losartan and HCTZ.  He was seen by [facility] inpatient in July for AKI felt 2/2 ATN from sustained pre-renal insults.  He was last seen in hospital follow-up in July by Dr. Joelyn Oms- assessed as having CKD stage IV and a high risk of progression to ESRD; felt to only be a candidate for in-center hemo when dialysis was needed.  He was  lost to follow-up.   He lives in a motel.  Would want dialysis if needed    Intake/Output Summary (Last 24 hours) at 03/17/2022 1207 Last data filed at 03/16/2022 1841 Gross per 24 hour  Intake 240 ml  Output --  Net 240 ml    Vitals:  Vitals:   03/17/22 0053 03/17/22 0612 03/17/22 0930 03/17/22 1204  BP: (!) 89/67 138/77 112/76 130/68  Pulse: 73 70 83   Resp: 18 16  18   Temp: 98.6 F (37 C) 98.4 F (36.9 C)  99.1 F (37.3 C)  TempSrc: Oral Oral  Oral  SpO2: 100% 99% 100%   Weight:      Height:         Physical Exam:  General: adult male in bed in NAD HEENT: NCAT  Eyes: EOMI sclera anicteric Neck: supple trachea midline  Heart: S1S2 no rub Lungs: clear but reduced; normal work of breathing on room air  Abdomen: soft/obese/nt Extremities: bilateral BKA's Skin: no rash on extremities exposed  Neuro: alert and oriented x 3 provides hx and follows commands Psych normal mood and affect TDC in place - also avf good bruit   Medications reviewed   Labs:     Latest Ref Rng & Units 03/16/2022   11:52 AM 03/15/2022    2:57 AM 03/14/2022    7:44 AM  BMP  Glucose 70 - 99 mg/dL 181  193  174   BUN 6 - 20 mg/dL 51  42  69   Creatinine 0.61 - 1.24 mg/dL 9.44  7.43  10.06    Sodium 135 - 145 mmol/L 133  135  134   Potassium 3.5 - 5.1 mmol/L 2.9  3.3  3.1   Chloride 98 - 111 mmol/L 98  98  104   CO2 22 - 32 mmol/L 21  22  17    Calcium 8.9 - 10.3 mg/dL 7.3  7.1  6.8      Assessment/Plan:   # AKI on CKD - progression to ESRD.   - He  is agreeable to start HD-  s/p [facility] and for first HD 12/5, second on 12/7, third on 12/9  -  s/p AVF on 12/7-  thank you  and CLIP for OP HD- has been accepted at [place] for 4th HD on Tuesday either here or at OP center  - Note that He is felt to be only a candidate for in-center HD - he is currently living in a motel . He asks about transplant-  these discussions will be ongoing-  not sure his social support with pass the test-  may also need SNF placement     #  HTN  Controlled-  no meds-  does not seem too volume overloaded    # BPH  - continue finasteride -  he had relief with the foley - started flomax-  will need to attempt to remove and do voiding trial- done 12/6   # Hyponatremia - Mild and setting of AKI, dehydration - improving    # Hypokalemia - potassium is better-  high K bath      # Anemia CKD  - No acute indication for PRBC's -  now in the 9's-  iron OK -  giving ESA    # HIV  - has had for over 20 years per charting   Bones-   PTH 225 -  phos 7.7-  added phoslo -  last  phos in the 5's  Dispo-  has perm access and OP center-  could be discharged from our standpoint but now pursuing placement   Louis Meckel, MD 03/17/2022 12:07 PM

## 2022-03-17 NOTE — Progress Notes (Signed)
[facility] PROGRESS NOTE  Keith [name] (DOB: February 08, 1966) CBS:[id number] [name], NP  Brief Narrative: Keith [name] is a 56 y.o. male with a history of stage IV CKD, HIV disease, IDT2DM, PVD s/p bilateral LE amputations, BPH who presented to the ED on 03/09/2022 with weakness and diarrhea found to have Salmonella and EPEC in stool studies. He was treated supportively, though suffered ARF for which nephrology was consulted and hemodialysis is initiated.   Subjective: Eating well, having some loose stool but improved, no abd pain. No other complaints.   Objective: BP 112/76 (BP Location: Right Arm)   Pulse 83   Temp 98.4 F (36.9 C) (Oral)   Resp 16   Ht 5\' 10"  (1.778 m)   Wt 115.9 kg   SpO2 100%   BMI 36.66 kg/m   Gen: Alert, conversant male in no distress Pulm: Clear, nonlabored  CV: RRR no MRG  GI: Soft, NT, ND, +BS  Neuro: Alert and oriented. No new focal deficits. Ext: Warm, stable LE amputations. AVF in LUE +thrill, no overlying erythema. Skin: No new rashes, lesions or ulcers on visualized skin. TDC site c/d/i  Assessment & Plan: Salmonella gastroenteritis, EPEC colonization: Suspected per ID.  - Treated with cipro/flagyl x1 and then CTX x3 days. No further Tx needed per ID. Stool output much better.  - Enteric precautions.   ARF on stage IV CKD, now ESRD:  - Continue HD per nephrology thru [facility] placed 12/5 by Dr. Serafina Royals. Next 12/9 as inpatient. [facility].  - s/p left brachiocephalic fistula by Dr. Virl Cagey 12/7. Follow up in 4 weeks.   BPH with acute urinary retention:  - Making urine without retention - Continue tamsulosin, finasteride  HIV disease: CD4 count is 129, VL undetectable - Changed ART to biktarvy, d/w ID, Dr. Linus Salmons now that patient is ESRD.  - Follow up with [facility], Dr. Candiss Norse, arranged 1/8 at 10:00am.  IDT2DM:  - Continue SSI, started glargine 5u daily now that po intake has improved, increase to 10u. This is overall  still much less than his prescribed outpatient dosing. ?decreased renal clearance vs. nonadherence  HTN:   - Hold hydralazine, norvasc, coreg, HCTZ, losartan for now. BP still normal.  Depression, anxiety:  - Continue lexapro  Neuropathy:  - Stopped lyrica with improved drowsiness.  Hypokalemia:  - Tx per nephrology  Hypocalcemia:  - Tx per nephrology  PVD: s/p left AKA, right BKA.  - Follow up with [name] per routine  CAD: s/p DES 2021.  - Continue ASA, plavix, statin (LFTs ok)  Debility: Will benefit from SNF rehabilitation post acute care stay. He is medically stable for discharge once this can be arranged. It seems [facility] transportation will be required for outpatient hemodialysis. Pt has bed offer at [facility] pending insurance authorization.   Patrecia Pour, MD [facility] [email or web address] 03/17/2022, 9:46 AM

## 2022-03-17 NOTE — Progress Notes (Signed)
Palliative Medicine Inpatient Follow Up Note HPI: 56 y.o. male  with past medical history of CAD, CKD (stage 4), HIV (Dovato and Pifeltro - intermittent compliance), DM2, bialteral leg amputation, BPH, and GERD admitted on 03/09/2022 with diarrhea.   Patient is being treated for AKI r/t diarrheal illness from salmonella and epec. Diarrhea has improved with treatment of ceftriaxone and cipro. Patient remains in AKI and has agreed to proceed with HD. Outpatient HD plans in progress with TOC following closely.   PMT was consulted in light of patient's significant illness and new need for dialysis.  Today's Discussion 03/17/2022  *Please note that this is a verbal dictation therefore any spelling or grammatical errors are due to the "[place] One" system interpretation.  Chart reviewed inclusive of vital signs, progress notes, laboratory results, and diagnostic images.   I spoke to Scout's night RN who shares that he had a good night for the most part, he had three lose bowel movements in total.   I checked in with Dominie this morning. He is awake and alert. He endorses feeling much improved overall. He denies pain, nausea, shortness of breath this morning.  Reviewed the plan for him to transition to [facility] as early as tomorrow with continuation of OP dialysis.   Explained in detail the role of a SNF: Skilled care is nursing and therapy care that can only be safely and effectively performed by, or under the supervision of, professionals or Metallurgist. It's health care given when you need skilled nursing or skilled therapy to treat, manage, and observe your condition, and evaluate your care. In a skilled nursing facility you'll receive one or more therapies for an average of one to two hours per day. This includes physical, occupational, and speech therapy. The therapies are not considered intensive.   I again reinforced that the hope of this would be to optimize  strength to get Gennaro back to his prior baseline level of functioning.   Questions and concerns addressed/Palliative Support Provided.   Objective Assessment: Vital Signs Vitals:   03/17/22 0053 03/17/22 0612  BP: (!) 89/67 138/77  Pulse: 73 70  Resp: 18 16  Temp: 98.6 F (37 C) 98.4 F (36.9 C)  SpO2: 100% 99%    Intake/Output Summary (Last 24 hours) at 03/17/2022 0734 Last data filed at 03/16/2022 1841 Gross per 24 hour  Intake 720 ml  Output --  Net 720 ml    Last Weight  Most recent update: 03/16/2022  2:56 PM    Weight  115.9 kg (255 lb 8.2 oz)            Gen:  Middle aged 63 M chronically ill appearing HEENT: moist mucous membranes CV: Regular rate and rhythm  PULM:  On RA breathing is even and nonlabored ABD: soft/nontender  EXT:  R BKA, L AKA Neuro: Alert and oriented x3   SUMMARY OF RECOMMENDATIONS   Full Code/ Full Scope of Care  Advance Directives completed 12/9  Plan for transition to [facility] once transportation to and from HD can be arranged  Billing based on MDM: [place] ______________________________________________________________________________________ [place] Team Team Cell Phone: [phone] Please utilize secure chat with additional questions, if there is no response within 30 minutes please call the above phone number  Palliative Medicine Team providers are available by phone from 7am to 7pm daily and can be reached through the team cell phone.  Should this patient require assistance outside of these hours,  please call the patient's attending physician.

## 2022-03-17 NOTE — Plan of Care (Signed)

## 2022-03-18 LAB — GLUCOSE, CAPILLARY
Glucose-Capillary: 234 mg/dL — ABNORMAL HIGH (ref 70–99)
Glucose-Capillary: 207 mg/dL — ABNORMAL HIGH (ref 70–99)
Glucose-Capillary: 199 mg/dL — ABNORMAL HIGH (ref 70–99)

## 2022-03-18 MED ORDER — CALCIUM ACETATE (PHOS BINDER) 667 MG PO CAPS: 1334.0000 mg | ORAL_CAPSULE | Freq: Three times a day (TID) | ORAL | Status: DC

## 2022-03-18 MED ORDER — INSULIN GLARGINE 100 UNIT/ML SOLOSTAR PEN: 10.0000 [IU] | PEN_INJECTOR | Freq: Every day | SUBCUTANEOUS | Status: DC

## 2022-03-18 MED ORDER — INSULIN ASPART 100 UNIT/ML IJ SOLN: 0.0000 [IU] | Freq: Three times a day (TID) | INTRAMUSCULAR | Status: DC

## 2022-03-18 MED ORDER — BICTEGRAVIR-EMTRICITAB-TENOFOV 50-200-25 MG PO TABS: 1.0000 | ORAL_TABLET | Freq: Every day | ORAL | 0 refills | Status: DC

## 2022-03-18 MED ORDER — TAMSULOSIN HCL 0.4 MG PO CAPS: 0.4000 mg | ORAL_CAPSULE | Freq: Every day | ORAL | Status: DC

## 2022-03-18 MED ORDER — OXYCODONE-ACETAMINOPHEN 5-325 MG PO TABS: 1.0000 | ORAL_TABLET | Freq: Three times a day (TID) | ORAL | 0 refills | Status: DC | PRN

## 2022-03-18 NOTE — TOC Progression Note (Addendum)
Transition of Care [facility]) - Progression Note    Patient Details  Name: Keith [name] MRN: [id number] Date of Birth: May 27, 1965  Transition of Care [facility]) CM/SW [place], [place] Phone Number: 03/18/2022, 12:16 PM  Clinical Narrative:     Update-4:00pm- Kia confirmed that [facility] set up transport for patient for HD.   Update- CSW received insurance authorization approval for patient. [place] ID# [id number].Insurance authorization has been approved from 12/11-12/14. CSW informed Kia with [facility]. CSW provided Kia with [facility] patients HD information. [place] [facility] on TTS 12:15 chair time. Pt can start on Tuesday 03/19/22 and will need to arrive at 11:15 to complete paperwork prior to treatment. Kia confirmed she will call CSW back once HD transportation is scheduled by facility.   Update- CSW spoke with Kia with [facility] who confirmed facility will assist with setting up HD transportation for patient as soon as Josem Kaufmann is confirmed for patient. CSW confimed facility choice with patient. Patient chose SNF placement at [facility]. CSW added facility choice to patients insurance authorization.CSW informed SNF auth still currently pending. CSW will continue to follow. CSW informed Olivia Mackie renal navigator.   Patients insurance authorization is still pending. CSW informed MD. CSW currently assisting in trying to set up HD transport to and from SNF for patient. CSW will continue to follow and assist with patients dc planning needs.  Expected Discharge Plan: [place] Barriers to Discharge: Continued Medical Work up  Expected Discharge Plan and Services Expected Discharge Plan: [place] In-house Referral: Clinical Social Work Discharge Planning Services: CM Consult Post Acute Care Choice: [place] Living arrangements for the past 2 months: Hotel/Motel Expected Discharge Date: 03/18/22                 DME Agency: NA                   Social Determinants  of Health ([place]) Interventions    Readmission Risk Interventions    03/13/2022    4:18 PM 01/01/2022   11:54 AM 10/10/2021    1:31 PM  Readmission Risk Prevention Plan  Transportation Screening Complete Complete Complete  Medication Review (RN Care Manager) Referral to Pharmacy Referral to Pharmacy Complete  PCP or Specialist appointment within 3-5 days of discharge  Complete Complete  HRI or [place] Complete Complete Complete  SW Recovery Care/Counseling Consult Complete Complete Complete  Palliative Care Screening Not Applicable Not Applicable Not [place]  Not Applicable Not Applicable

## 2022-03-18 NOTE — Care Management Important Message (Signed)
Important Message  Patient Details  Name: Keith [name] MRN: [id number] Date of Birth: 06/02/1965   Medicare Important Message Given:  Yes     Shelda Altes 03/18/2022, 8:45 AM

## 2022-03-18 NOTE — Progress Notes (Signed)
Report given to Teil RN at [facility]. IV out. Monitor off CCMD notified.

## 2022-03-18 NOTE — Progress Notes (Addendum)
Spoke to pt's brother via phone Friday afternoon. Brother was advised of out-pt HD arrangements. Case discussed with CSW this am. Pt is for placement at [facility] snf but snf does not transport to HD. CSW to investigate transportation options from snf to HD at d/c. Update provided to clinic. Will await final out come from [place].   Melven Sartorius Renal Navigator [phone]  Addendum at 4:35 pm: Advised by CSW that snf has arranged transportation to HD starting tomorrow. Clinic aware pt will d/c today and will start tomorrow. Clinic has orders from [place]. CSW has advised snf of HD arrangements.

## 2022-03-18 NOTE — TOC Transition Note (Addendum)
Transition of Care [facility]) - CM/SW Discharge Note   Patient Details  Name: Keith [name] MRN: [id number] Date of Birth: 04/01/1966  Transition of Care [facility]) CM/SW Contact:  Milas Gain, [place] Phone Number: 03/18/2022, 3:54 PM   Clinical Narrative:     Patient will DC to: [facility]   Anticipated DC date: 03/18/2022  Family notified: Nurse, mental health by: Corey Harold  ?  Per MD patient ready for DC to [facility] . RN, patient, Olivia Mackie Renal Navigator,patient's family, and facility notified of DC. Discharge Summary sent to facility. HD transport has been arranged by SNF.RN given number for report tele# [phone] RM# [id number]. DC packet on chart. Ambulance transport requested for patient.  CSW signing off.    Final next level of care: Skilled Nursing Facility Barriers to Discharge: No Barriers Identified   Patient Goals and CMS Choice Patient states their goals for this hospitalization and ongoing recovery are:: SNF CMS Medicare.gov Compare Post Acute Care list provided to:: Patient Choice offered to / list presented to : Patient  Discharge Placement              Patient chooses bed at: [facility] Patient to be transferred to facility by: [place] Name of family member notified: Tim Patient and family notified of of transfer: 03/18/22  Discharge Plan and Services In-house Referral: Clinical Social Work Discharge Planning Services: CM Consult Post Acute Care Choice: [place]            DME Agency: NA                  Social Determinants of Health ([place]) Interventions     Readmission Risk Interventions    03/13/2022    4:18 PM 01/01/2022   11:54 AM 10/10/2021    1:31 PM  Readmission Risk Prevention Plan  Transportation Screening Complete Complete Complete  Medication Review (RN Care Manager) Referral to Pharmacy Referral to Pharmacy Complete  PCP or Specialist appointment within 3-5 days of discharge  Complete Complete  HRI or [place] Complete  Complete Complete  SW Recovery Care/Counseling Consult Complete Complete Complete  Palliative Care Screening Not Applicable Not Applicable Not [place]  Not Applicable Not Applicable

## 2022-03-18 NOTE — Progress Notes (Signed)
Orthopedic Tech Progress Note Patient Details:  Keith [name] Aug 21, 1965 [id number] Called order into [facility] Patient ID: [name], male   DOB: June 28, 1965, 56 y.o.   MRN: [id number]  [name] 03/18/2022, 12:45 PM

## 2022-03-18 NOTE — Progress Notes (Signed)
Physical Therapy Treatment Patient Details Name: Keith [name] MRN: [id number] DOB: [birth year] Today's Date: 03/18/2022   History of Present Illness Pt is a 56 y.o. male who presented 03/09/22 with generalized weakness and bouts of diarrhea. Found to have Salmonella and EPEC in stool studies and suffered ARF. HD catheter placed 12/5, L arm brachiocephalic fistula 35/4. PMHx: R BKA, L AKA, AIDS, CVA, CAD, MI, HTN, CKD, DM2, PVD, CKD, HLD    PT Comments    Pt eager to get up to recliner. PT plan to utilize R LE prosthetic to perform pivot transfer. Prosthetic in room however PT unable to find residual limb shrinker. Pt becomes very upset that it had been lost sometime during his transfer to [facility]. RN notified however given time lapse since transfer likely lost. Pt encouraged to perform scoot transfer to recliner however with min guard pt utilizes R residual limb to "step" on recliner surface. Pt advised increase pressure on end of residual limb not advised as injury could occur decreasing ability to use prosthetic. Pt reports it is his normal transfer pattern. D/c plans remain appropriate at this time and will happen when HD transport can be arranged.   Recommendations for follow up therapy are one component of a multi-disciplinary discharge planning process, led by the attending physician.  Recommendations may be updated based on patient status, additional functional criteria and insurance authorization.  Follow Up Recommendations  Skilled nursing-short term rehab (<3 hours/day) (HHPT if family can provide level of care needed) Can patient physically be transported by private vehicle: No   Assistance Recommended at Discharge Intermittent Supervision/Assistance  Patient can return home with the following A lot of help with walking and/or transfers;A lot of help with bathing/dressing/bathroom;Assistance with cooking/housework;Assist for transportation;Direct supervision/assist for financial  management;Direct supervision/assist for medications management   Equipment Recommendations  None recommended by PT       Precautions / Restrictions Precautions Precautions: Fall;Other (comment) Precaution Comments: enteric; R BKA, L AKA with bil prostheses, R prosthesis in room Restrictions Weight Bearing Restrictions: No     Mobility  Bed Mobility Overal bed mobility: Needs Assistance Bed Mobility: Supine to Sit     Supine to sit: Supervision     General bed mobility comments: supervision and use of bedrail to come to EoB    Transfers Overall transfer level: Needs assistance Equipment used: None Transfers: Bed to chair/wheelchair/BSC         Anterior-Posterior transfers: Min guard   General transfer comment: Pt with unorthodox transfer from bed to chair in which he grabbed far armrest of the recliner and "walked" on his R residual limb, educated that increased pressure on residual limb end may cause injury and inability to use prosthetic.    Ambulation/Gait               General Gait Details: unable to use prosthetic due to lost shrinker       Balance Overall balance assessment: Needs assistance Sitting-balance support: Single extremity supported, Bilateral upper extremity supported, Feet unsupported Sitting balance-Leahy Scale: Fair Sitting balance - Comments: in chair                                    Cognition Arousal/Alertness: Awake/alert Behavior During Therapy: Flat affect Overall Cognitive Status: No family/caregiver present to determine baseline cognitive functioning  General Comments: pt upset that his prosthetic shrinker was lost in transfer from [facility] to [place] comments (skin integrity, edema, etc.): VSS on RA, Pt very concerned about the loss of his prosthetic shrinker during transfer from [facility], RN notified      Pertinent  Vitals/Pain Pain Assessment Pain Assessment: Faces Faces Pain Scale: Hurts a little bit Pain Location: grimacing with general mobility Pain Descriptors / Indicators: Grimacing Pain Intervention(s): Limited activity within patient's tolerance, Monitored during session, Repositioned     PT Goals (current goals can now be found in the care plan section) Acute Rehab PT Goals Patient Stated Goal: to walk later PT Goal Formulation: With patient Time For Goal Achievement: 03/29/22 Potential to Achieve Goals: Good Progress towards PT goals: Progressing toward goals    Frequency    Min 3X/week      PT Plan Current plan remains appropriate       AM-PAC PT "6 Clicks" Mobility   Outcome Measure  Help needed turning from your back to your side while in a flat bed without using bedrails?: A Little Help needed moving from lying on your back to sitting on the side of a flat bed without using bedrails?: A Lot Help needed moving to and from a bed to a chair (including a wheelchair)?: A Little Help needed standing up from a chair using your arms (e.g., wheelchair or bedside chair)?: Total Help needed to walk in hospital room?: Total Help needed climbing 3-5 steps with a railing? : Total 6 Click Score: 11    End of Session   Activity Tolerance: Patient tolerated treatment well Patient left: in chair;with call bell/phone within reach;with chair alarm set Nurse Communication: Mobility status;Other (comment) (BP) PT Visit Diagnosis: Unsteadiness on feet (R26.81);Muscle weakness (generalized) (M62.81);Difficulty in walking, not elsewhere classified (R26.2)     Time: 9417-4081 PT Time Calculation (min) (ACUTE ONLY): 18 min  Charges:  $Therapeutic Activity: 8-22 mins                     Jahnya Trindade B. Migdalia Dk PT, DPT Acute Rehabilitation Services Please use secure chat or  Call Office [phone]    [place] 03/18/2022, 9:43 AM

## 2022-03-18 NOTE — Progress Notes (Signed)
[facility] Progress Note   Subjective:  Awaiting SNF HD [facility] Has TDC and recent maturing AVF    56 y.o. male with HIV, CAD with hx STEMI, CKD stage IV, DM type 2, HTN, and prior CVA who presented  with generalized weakness and watery, non-bloody diarrhea.  He was found to have creatinine markedly elevated at 12.59.   baseline Cr 3.5 -4.0.  past meds include losartan and HCTZ.  He was seen by [facility] inpatient in July for AKI felt 2/2 ATN from sustained pre-renal insults.  He was last seen in hospital follow-up in July by Dr. Joelyn Oms- assessed as having CKD stage IV and a high risk of progression to ESRD; felt to only be a candidate for in-center hemo when dialysis was needed.  He was  lost to follow-up.   He lives in a motel.  Would want dialysis if needed. Now ESRD   Vitals:  Vitals:   03/17/22 1204 03/17/22 1707 03/17/22 2000 03/18/22 0344  BP: 130/68 124/71 128/68 134/78  Pulse: 76 68 76 62  Resp: 18 18 18 18   Temp: 99.1 F (37.3 C)  99 F (37.2 C) 98.9 F (37.2 C)  TempSrc: Oral Oral Oral Oral  SpO2: 100% 100% 99% 100%  Weight:      Height:         Physical Exam:  General: adult male in chair in NAD HEENT: NCAT  Eyes: EOMI sclera anicteric Neck: supple trachea midline  Heart: S1S2 no rub Lungs: clear but reduced; normal work of breathing on room air  Abdomen: soft/obese/nt Extremities: bilateral BKA's Skin: no rash on extremities exposed  Neuro: alert and oriented x 3 provides hx and follows commands Psych normal mood and affect TDC in place - also avf good bruit   Medications reviewed   Labs:     Latest Ref Rng & Units 03/16/2022   11:52 AM 03/15/2022    2:57 AM 03/14/2022    7:44 AM  BMP  Glucose 70 - 99 mg/dL 181  193  174   BUN 6 - 20 mg/dL 51  42  69   Creatinine 0.61 - 1.24 mg/dL 9.44  7.43  10.06   Sodium 135 - 145 mmol/L 133  135  134   Potassium 3.5 - 5.1 mmol/L 2.9  3.3  3.1   Chloride 98 - 111 mmol/L 98  98  104   CO2 22 - 32 mmol/L 21   22  17    Calcium 8.9 - 10.3 mg/dL 7.3  7.1  6.8      Assessment/Plan:   # AKI on CKD, new ESRD Started HD 03/12/22; completed first 3 treatments CLIP to [facility] THS, can start 03/19/22 s/p AVF and [facility] on 12/7-  Tplt ?s deferred to outpt care   #  HTN  Controlled-  no meds-  does not seem too volume overloaded    # BPH  - continue finasteride -  he had relief with the foley - started flomax-  s/p voiding trial- done 12/6   # Hyponatremia stable   # Hypokalemia - potassium is better-  high K bath      # Anemia CKD  - No acute indication for PRBC's -  now in the 9's-  iron OK -  giving ESA    # HIV  - has had for over 20 years per charting   [place]-   PTH 225 -  [place], MD 03/18/2022 11:02 AM

## 2022-03-18 NOTE — Discharge Summary (Signed)
Physician Discharge Summary   Patient: Keith [name] MRN: [id number] DOB: January 07, 1966  Admit date:     03/09/2022  Discharge date: 03/18/22  Discharge Physician: Patrecia Pour   [name], NP   Recommendations at discharge:  Continue chronic disease management per PCP after SNF discharge.  Follow up blood pressure. Note since initiating HD, BP is normotensive despite not taking prescribed medications including norvasc, coreg, hydralazine, losartan, HCTZ which will continue to be held. Continue insulin management, CBG checks. Recommend 10u basal and sensitive SSI for now. This is much less than previously prescribed doses of humalog (38u lantus, 10u + 0-15u humalog) Continue routine (new) HD TTS thru [facility] as LUE AVF matures. Follow up with vascular surgery in ~5 weeks.  Monitor mental status and neuropathic symptoms. Gabapentin and lyrica stopped due to somnolence, can restart at low doses if needed. Follow up with ID clinic, Dr. Candiss Norse 1/8. Note changed ART to biktarvy.  Discharge Diagnoses: Principal Problem:   AKI (acute kidney injury) ([place]) Active Problems:   Diarrhea   HTN (hypertension)   HIV disease ([place])   CKD (chronic kidney disease) stage 4, GFR 15-29 ml/min (HCC)   CAD (coronary artery disease)   BPH (benign prostatic hyperplasia)   GERD (gastroesophageal reflux disease)   Gallbladder anomaly  Hospital Course: Keith [name] is a 56 y.o. male with a history of stage IV CKD, HIV disease, IDT2DM, PVD s/p bilateral LE amputations, BPH who presented to the ED on 03/09/2022 with weakness and diarrhea found to have Salmonella and EPEC in stool studies. He was treated supportively, though suffered ARF for which nephrology was consulted and hemodialysis is initiated. Diarrhea has resolved. He is getting HD TTS schedule to continue thru [facility]. See below for details.  Assessment and Plan: Salmonella gastroenteritis, EPEC colonization: Suspected per ID.  - Treated with  cipro/flagyl x1 and then CTX x3 days. No further Tx needed per ID. Stool output much better.  - Enteric precautions can be discontinued now that diarrhea is resolved.    ARF on stage IV CKD, now ESRD:  - Continue HD per nephrology thru [facility] placed 12/5 by Dr. Serafina Royals. [facility] spot [facility], will ensure transportation is arranged prior to discharge.  - s/p left brachiocephalic fistula by Dr. Virl Cagey 12/7. Follow up in 4-5 weeks.    BPH with acute urinary retention:  - Making urine without retention - Continue new tamsulosin, and PTA finasteride   HIV disease: CD4 count is 129, VL undetectable - Changed ART to biktarvy, d/w ID, Dr. Linus Salmons now that patient is ESRD.  - Follow up with [facility], Dr. Candiss Norse, arranged 1/8 at 10:00am.   IDT2DM:  - Continue SSI, started glargine 5u daily now that po intake has improved, increase to 10u. This is overall still much less than his prescribed outpatient dosing. ?decreased renal clearance vs. nonadherence   HTN:   - Hold hydralazine, norvasc, coreg, HCTZ, losartan for now. BP still normal.   Depression, anxiety:  - Continue lexapro   Neuropathy:  - Stopped lyrica, gabapentin with significantly improved drowsiness.   Hypokalemia:  - Tx per nephrology   Hypocalcemia:  - Tx per nephrology   PVD: s/p left AKA, right BKA.  - Follow up with [name] per routine   CAD: s/p DES 2021.  - Continue ASA, plavix, statin (LFTs ok)   Debility: Will benefit from SNF rehabilitation post acute care stay. He is medically stable for discharge once this can  be arranged. It seems [facility] transportation will be required for outpatient hemodialysis. Pt has bed offer at [facility] pending insurance authorization.   Consultants: Nephrology, vascular surgery Procedures performed:  03/14/22 LEFT BRACHIOCEPHALIC ARTERIOVENOUS (AV) FISTULA CREATION Robins, Joshua E, MD  Disposition: Skilled nursing facility Diet recommendation: Renal, carb-modified, cardiac DISCHARGE  MEDICATION: Allergies as of 03/18/2022       Reactions   Elemental Sulfur Itching   Patient stated he's allergic to "sulfur" AND "sulfa"   Metformin And Related Other (See Comments)   Stopped by MD- affected kidneys   Zetia [ezetimibe] Nausea Only   Sulfa Antibiotics Itching        Medication List     STOP taking these medications    amLODipine 10 MG tablet Commonly known as: NORVASC   carvedilol 3.125 MG tablet Commonly known as: COREG   cyclobenzaprine 5 MG tablet Commonly known as: FLEXERIL   docusate sodium 100 MG capsule Commonly known as: Colace   doravirine 100 MG Tabs tablet Commonly known as: PIFELTRO   Dovato 50-300 MG tablet Generic drug: dolutegravir-lamiVUDine   gabapentin 100 MG capsule Commonly known as: Neurontin   HumaLOG KwikPen 100 UNIT/ML KwikPen Generic drug: insulin lispro   hydrALAZINE 10 MG tablet Commonly known as: APRESOLINE   hydrochlorothiazide 12.5 MG tablet Commonly known as: HYDRODIURIL   losartan 50 MG tablet Commonly known as: COZAAR   metFORMIN 1000 MG tablet Commonly known as: GLUCOPHAGE   nitroGLYCERIN 0.4 MG SL tablet Commonly known as: NITROSTAT   oxyCODONE 15 MG immediate release tablet Commonly known as: ROXICODONE   pregabalin 150 MG capsule Commonly known as: LYRICA   pregabalin 50 MG capsule Commonly known as: LYRICA   sodium bicarbonate 650 MG tablet       TAKE these medications    allopurinol 100 MG tablet Commonly known as: ZYLOPRIM Take 100 mg by mouth daily.   aspirin EC 81 MG tablet Take 81 mg by mouth daily. Swallow whole.   atorvastatin 80 MG tablet Commonly known as: LIPITOR Take 1 tablet (80 mg total) by mouth daily.   bictegravir-emtricitabine-tenofovir AF 50-200-25 MG Tabs tablet Commonly known as: BIKTARVY Take 1 tablet by mouth daily with supper.   calcium acetate 667 MG capsule Commonly known as: PHOSLO Take 2 capsules (1,334 mg total) by mouth 3 (three) times daily  with meals.   clopidogrel 75 MG tablet Commonly known as: PLAVIX Take 1 tablet (75 mg total) by mouth daily.   escitalopram 20 MG tablet Commonly known as: LEXAPRO Take 1 tablet (20 mg total) by mouth daily.   ferrous sulfate 325 (65 FE) MG EC tablet Take 325 mg by mouth daily with breakfast. What changed: Another medication with the same name was removed. Continue taking this medication, and follow the directions you see here.   finasteride 5 MG tablet Commonly known as: PROSCAR Take 5 mg by mouth daily.   insulin aspart 100 UNIT/ML injection Commonly known as: novoLOG Inject 0-9 Units into the skin 3 (three) times daily with meals.   insulin glargine 100 UNIT/ML Solostar Pen Commonly known as: LANTUS Inject 10 Units into the skin daily. What changed: how much to take   onetouch ultrasoft lancets Use as instructed   oxyCODONE-acetaminophen 5-325 MG tablet Commonly known as: PERCOCET/ROXICET Take 1 tablet by mouth every 8 (eight) hours as needed for severe pain.   tamsulosin 0.4 MG Caps capsule Commonly known as: FLOMAX Take 1 capsule (0.4 mg total) by mouth daily after supper.          Follow-up Information     [facility] Follow up in 5 week(s).   Specialty: Vascular Surgery Contact information: [address] [place] [phone]        [name], NP Follow up.   Specialty: Adult Health Nurse Practitioner Contact information: [address] [place]er [place] [address] [phone]                Discharge Exam: Danley Danker Weights   03/14/22 0824 03/14/22 2245 03/16/22 1430  Weight: 117.9 kg 113.6 kg 115.9 kg  BP 134/78 (BP Location: Left Arm)   Pulse 62   Temp 98.9 F (37.2 C) (Oral)   Resp 18   Ht 5' 10" (1.778 m)   Wt 115.9 kg   SpO2 100%   BMI 36.66 kg/m   No distress Clear, nonlabored RRR, no edema LE amputation stumps stable, normal. LUE AVF +thrill and bruit with  surgical wound edges well apposed with dermabond, no discharge or erythema or significant tenderness.   Condition at discharge: stable  The results of significant diagnostics from this hospitalization (including imaging, microbiology, ancillary and laboratory) are listed below for reference.   Imaging Studies: VAS [place] UPPER EXT VEIN MAPPING (PRE-OP AVF)  Result Date: 03/13/2022 UPPER EXTREMITY VEIN MAPPING Patient Name:  NUH LIPTON  Date of Exam:   03/13/2022 Medical Rec #: [id number]       Accession #:    [id number] Date of Birth: Mar 30, 1966       Patient Gender: M Patient Age:   41 years Exam Location:  [facility] Procedure:      VAS [place] UPPER EXT VEIN MAPPING (PRE-OP AVF) Referring Phys: Dagoberto Ligas --------------------------------------------------------------------------------  Indications: Pre-access. History: ESRD, patient is right hand dominant.  Comparison Study: 03-11-2022 Right upper extremity vein mapping. Performing Technologist: Darlin Coco RDMS, RVT  Examination Guidelines: A complete evaluation includes B-mode imaging, spectral Doppler, color Doppler, and power Doppler as needed of all accessible portions of each vessel. Bilateral testing is considered an integral part of a complete examination. Limited examinations for reoccurring indications may be performed as noted. +-----------------+-------------+----------+---------+ Left Cephalic    Diameter (cm)Depth (cm)Findings  +-----------------+-------------+----------+---------+ Shoulder             0.34        1.39             +-----------------+-------------+----------+---------+ Prox upper arm       0.47        0.95   branching +-----------------+-------------+----------+---------+ Mid upper arm        0.48        0.93             +-----------------+-------------+----------+---------+ Dist upper arm       0.37        0.59   branching +-----------------+-------------+----------+---------+ Antecubital  fossa    0.49        0.43             +-----------------+-------------+----------+---------+ Prox forearm         0.25        0.72             +-----------------+-------------+----------+---------+ Mid forearm          0.26        0.62             +-----------------+-------------+----------+---------+ Dist forearm         0.25        0.41  branching +-----------------+-------------+----------+---------+ Wrist                0.31        0.33             +-----------------+-------------+----------+---------+ +-----------------+-------------+----------+---------+ Left Basilic     Diameter (cm)Depth (cm)Findings  +-----------------+-------------+----------+---------+ Mid upper arm        0.63                         +-----------------+-------------+----------+---------+ Dist upper arm       0.65                         +-----------------+-------------+----------+---------+ Antecubital fossa    0.56                         +-----------------+-------------+----------+---------+ Prox forearm         0.50                         +-----------------+-------------+----------+---------+ Mid forearm          0.40                         +-----------------+-------------+----------+---------+ Distal forearm       0.36               branching +-----------------+-------------+----------+---------+ Wrist                0.26                         +-----------------+-------------+----------+---------+ *See table(s) above for measurements and observations.  Diagnosing physician: Harold Barban MD Electronically signed by Harold Barban MD on 03/13/2022 at 8:54:42 PM.    Final    IR Venocavagram Svc  Result Date: 03/12/2022 INDICATION: 56 year old male with insert renal disease requiring central venous access for hemodialysis. EXAM: 1. Central venogram. 2. TUNNELED CENTRAL VENOUS HEMODIALYSIS CATHETER PLACEMENT WITH ULTRASOUND AND FLUOROSCOPIC GUIDANCE MEDICATIONS: Ancef  2 gm IV . The antibiotic was given in an appropriate time interval prior to skin puncture. ANESTHESIA/SEDATION: Moderate (conscious) sedation was employed during this procedure. A total of Versed 1 mg and Fentanyl 25 mcg was administered intravenously. Moderate Sedation Time: 24 minutes. The patient's level of consciousness and vital signs were monitored continuously by radiology nursing throughout the procedure under my direct supervision. FLUOROSCOPY TIME:  Sixteen mGy CONTRAST:  10 mL Omnipaque 300, intravenous COMPLICATIONS: None immediate. PROCEDURE: Informed written consent was obtained from the patient after a discussion of the risks, benefits, and alternatives to treatment. Questions regarding the procedure were encouraged and answered. The right neck and chest were prepped with chlorhexidine in a sterile fashion, and a sterile drape was applied covering the operative field. Maximum barrier sterile technique with sterile gowns and gloves were used for the procedure. A timeout was performed prior to the initiation of the procedure. After creating a small venotomy incision, a 21 gauge micropuncture kit was utilized to access the internal jugular vein. Real-time ultrasound guidance was utilized for vascular access including the acquisition of a permanent ultrasound image documenting patency of the accessed vessel. The microwire was difficult to advance after punctures, therefore central venogram was performed through the needle which demonstrated patent right internal jugular vein and superior vena cava which drains briskly into the right atrium. A Rosen wire was advanced  to the level of the IVC and the micropuncture sheath was exchanged for an 8 Fr dilator. A 14.5 French tunneled hemodialysis catheter measuring 23 cm from tip to cuff was tunneled in a retrograde fashion from the anterior chest wall to the venotomy incision. Serial dilation was then performed an a peel-away sheath was placed. The catheter was  then placed through the peel-away sheath with the catheter tip ultimately positioned within the right atrium. Final catheter positioning was confirmed and documented with a spot radiographic image. The catheter aspirates and flushes normally. The catheter was flushed with appropriate volume heparin dwells. The catheter exit site was secured with a 0-Silk retention suture. The venotomy incision was closed with Dermabond. Sterile dressings were applied. The patient tolerated the procedure well without immediate post procedural complication. IMPRESSION: 1. Successful placement of 23 cm tip to cuff tunneled hemodialysis catheter via the right internal jugular vein with catheter tip terminating within the right atrium. The catheter is ready for immediate use. 2. Central venogram demonstrates patent right central veins. Ruthann Cancer, MD [facility] Electronically Signed   By: Ruthann Cancer M.D.   On: 03/12/2022 16:52   IR US Guide Vasc Access Right  Result Date: 03/12/2022 INDICATION: 56 year old male with insert renal disease requiring central venous access for hemodialysis. EXAM: 1. Central venogram. 2. TUNNELED CENTRAL VENOUS HEMODIALYSIS CATHETER PLACEMENT WITH ULTRASOUND AND FLUOROSCOPIC GUIDANCE MEDICATIONS: Ancef 2 gm IV . The antibiotic was given in an appropriate time interval prior to skin puncture. ANESTHESIA/SEDATION: Moderate (conscious) sedation was employed during this procedure. A total of Versed 1 mg and Fentanyl 25 mcg was administered intravenously. Moderate Sedation Time: 24 minutes. The patient's level of consciousness and vital signs were monitored continuously by radiology nursing throughout the procedure under my direct supervision. FLUOROSCOPY TIME:  Sixteen mGy CONTRAST:  10 mL Omnipaque 300, intravenous COMPLICATIONS: None immediate. PROCEDURE: Informed written consent was obtained from the patient after a discussion of the risks,  benefits, and alternatives to treatment. Questions regarding the procedure were encouraged and answered. The right neck and chest were prepped with chlorhexidine in a sterile fashion, and a sterile drape was applied covering the operative field. Maximum barrier sterile technique with sterile gowns and gloves were used for the procedure. A timeout was performed prior to the initiation of the procedure. After creating a small venotomy incision, a 21 gauge micropuncture kit was utilized to access the internal jugular vein. Real-time ultrasound guidance was utilized for vascular access including the acquisition of a permanent ultrasound image documenting patency of the accessed vessel. The microwire was difficult to advance after punctures, therefore central venogram was performed through the needle which demonstrated patent right internal jugular vein and superior vena cava which drains briskly into the right atrium. A Rosen wire was advanced to the level of the IVC and the micropuncture sheath was exchanged for an 8 Fr dilator. A 14.5 French tunneled hemodialysis catheter measuring 23 cm from tip to cuff was tunneled in a retrograde fashion from the anterior chest wall to the venotomy incision. Serial dilation was then performed an a peel-away sheath was placed. The catheter was then placed through the peel-away sheath with the catheter tip ultimately positioned within the right atrium. Final catheter positioning was confirmed and documented with a spot radiographic image. The catheter aspirates and flushes normally. The catheter was flushed with appropriate volume heparin dwells. The catheter exit site was secured with a 0-Silk retention suture. The venotomy incision was closed  with Dermabond. Sterile dressings were applied. The patient tolerated the procedure well without immediate post procedural complication. IMPRESSION: 1. Successful placement of 23 cm tip to cuff tunneled hemodialysis catheter via the right  internal jugular vein with catheter tip terminating within the right atrium. The catheter is ready for immediate use. 2. Central venogram demonstrates patent right central veins. Dylan Suttle, MD [facility] [place] [facility] Electronically Signed   By: Dylan  Suttle M.D.   On: 03/12/2022 09:57   IR Fluoro Guide CV Line Right  Result Date: 03/12/2022 INDICATION: 56-year-old male with insert renal disease requiring central venous access for hemodialysis. EXAM: 1. Central venogram. 2. TUNNELED CENTRAL VENOUS HEMODIALYSIS CATHETER PLACEMENT WITH ULTRASOUND AND FLUOROSCOPIC GUIDANCE MEDICATIONS: Ancef 2 gm IV . The antibiotic was given in an appropriate time interval prior to skin puncture. ANESTHESIA/SEDATION: Moderate (conscious) sedation was employed during this procedure. A total of Versed 1 mg and Fentanyl 25 mcg was administered intravenously. Moderate Sedation Time: 24 minutes. The patient's level of consciousness and vital signs were monitored continuously by radiology nursing throughout the procedure under my direct supervision. FLUOROSCOPY TIME:  Sixteen mGy CONTRAST:  10 mL Omnipaque 300, intravenous COMPLICATIONS: None immediate. PROCEDURE: Informed written consent was obtained from the patient after a discussion of the risks, benefits, and alternatives to treatment. Questions regarding the procedure were encouraged and answered. The right neck and chest were prepped with chlorhexidine in a sterile fashion, and a sterile drape was applied covering the operative field. Maximum barrier sterile technique with sterile gowns and gloves were used for the procedure. A timeout was performed prior to the initiation of the procedure. After creating a small venotomy incision, a 21 gauge micropuncture kit was utilized to access the internal jugular vein. Real-time ultrasound guidance was utilized for vascular access including the acquisition of a permanent ultrasound image  documenting patency of the accessed vessel. The microwire was difficult to advance after punctures, therefore central venogram was performed through the needle which demonstrated patent right internal jugular vein and superior vena cava which drains briskly into the right atrium. A Rosen wire was advanced to the level of the IVC and the micropuncture sheath was exchanged for an 8 Fr dilator. A 14.5 French tunneled hemodialysis catheter measuring 23 cm from tip to cuff was tunneled in a retrograde fashion from the anterior chest wall to the venotomy incision. Serial dilation was then performed an a peel-away sheath was placed. The catheter was then placed through the peel-away sheath with the catheter tip ultimately positioned within the right atrium. Final catheter positioning was confirmed and documented with a spot radiographic image. The catheter aspirates and flushes normally. The catheter was flushed with appropriate volume heparin dwells. The catheter exit site was secured with a 0-Silk retention suture. The venotomy incision was closed with Dermabond. Sterile dressings were applied. The patient tolerated the procedure well without immediate post procedural complication. IMPRESSION: 1. Successful placement of 23 cm tip to cuff tunneled hemodialysis catheter via the right internal jugular vein with catheter tip terminating within the right atrium. The catheter is ready for immediate use. 2. Central venogram demonstrates patent right central veins. Dylan Suttle, MD [facility] [place] [facility] Electronically Signed   By: Dylan  Suttle M.D.   On: 03/12/2022 09:57   VAS US UPPER EXT VEIN MAPPING (PRE-OP AVF)  Result Date: 03/11/2022 UPPER EXTREMITY VEIN MAPPING Patient Name:  Marsel A Sanabia  Date of Exam:   03/11/2022 Medical Rec #: [id number]         Accession #:    [id number] Date of Birth: 05-20-1965       Patient Gender: M Patient Age:   67 years Exam Location:  [facility] Procedure:      VAS [place] UPPER EXT VEIN MAPPING (PRE-OP AVF) Referring Phys: Corliss Parish --------------------------------------------------------------------------------  Indications: Pre-access. Limitations: patient positioning Comparison Study: No prior studies. Performing Technologist: Oliver Hum RVT  Examination Guidelines: A complete evaluation includes B-mode imaging, spectral Doppler, color Doppler, and power Doppler as needed of all accessible portions of each vessel. Bilateral testing is considered an integral part of a complete examination. Limited examinations for reoccurring indications may be performed as noted. +-----------------+-------------+----------+--------------+ Right Cephalic   Diameter (cm)Depth (cm)   Findings    +-----------------+-------------+----------+--------------+ Shoulder             0.43        2.26                  +-----------------+-------------+----------+--------------+ Prox upper arm       0.35        0.86                  +-----------------+-------------+----------+--------------+ Mid upper arm        0.40        0.63                  +-----------------+-------------+----------+--------------+ Dist upper arm       0.46        0.49     branching    +-----------------+-------------+----------+--------------+ Antecubital fossa    0.48        0.42                  +-----------------+-------------+----------+--------------+ Prox forearm                            not visualized +-----------------+-------------+----------+--------------+ Mid forearm          0.34        0.74                  +-----------------+-------------+----------+--------------+ Dist forearm         0.35        0.41     branching    +-----------------+-------------+----------+--------------+ +-----------------+-------------+----------+---------+ Right Basilic    Diameter (cm)Depth (cm)Findings   +-----------------+-------------+----------+---------+ Shoulder             0.68        2.30             +-----------------+-------------+----------+---------+ Prox upper arm       0.50        2.19             +-----------------+-------------+----------+---------+ Mid upper arm        0.47        1.37   branching +-----------------+-------------+----------+---------+ Dist upper arm       0.55        1.06             +-----------------+-------------+----------+---------+ Antecubital fossa    0.55        0.82             +-----------------+-------------+----------+---------+ Prox forearm         0.43        0.32   branching +-----------------+-------------+----------+---------+ Mid forearm          0.35        0.25  branching +-----------------+-------------+----------+---------+ Distal forearm       0.34        0.25             +-----------------+-------------+----------+---------+ *See table(s) above for measurements and observations.  Diagnosing physician: Brandon Cain MD Electronically signed by Brandon Cain MD on 03/11/2022 at 4:37:46 PM.    Final    US Abdomen Limited RUQ (LIVER/GB)  Result Date: 03/09/2022 CLINICAL DATA:  [id number] Cholelithiasis [id number] EXAM: ULTRASOUND ABDOMEN LIMITED COMPARISON:  None Available. FINDINGS: The liver demonstrates normal parenchymal echogenicity and homogeneous texture without focal hepatic parenchymal lesions or intrahepatic ductal dilatation is identified. Gallbladder wall is thickened. There is evidence of sludge. A few small shadowing stones are demonstrated. No pericholecystic fluid identified. CBD 0.2 cm. Pancreas not visualized. IMPRESSION: Abnormal gallbladder with sludge, stones and wall thickening. Electronically Signed   By: Joshua  Pleasure M.D.   On: 03/09/2022 09:40   CT ABDOMEN PELVIS WO CONTRAST  Result Date: 03/09/2022 CLINICAL DATA:  Acute kidney failure EXAM: CT ABDOMEN AND PELVIS WITHOUT CONTRAST TECHNIQUE:  Multidetector CT imaging of the abdomen and pelvis was performed following the standard protocol without IV contrast. RADIATION DOSE REDUCTION: This exam was performed according to the departmental dose-optimization program which includes automated exposure control, adjustment of the mA and/or kV according to patient size and/or use of iterative reconstruction technique. COMPARISON:  Prior CT scan of the abdomen and pelvis 12/18/2021 FINDINGS: Lower chest: No acute abnormality. Similar appearance of small pulmonary nodules and tree-in-bud micro nodularity in the periphery of the right lower lobe. Borderline cardiomegaly. Hepatobiliary: Cholelithiasis. There is some suggestion of submucosal edema involving the gallbladder, however the area is marred by mild motion artifact and beam hardening artifact. Normal hepatic contour and morphology. No biliary ductal dilatation. Pancreas: Unremarkable. No pancreatic ductal dilatation or surrounding inflammatory changes. Spleen: Normal in size without focal abnormality. Adrenals/Urinary Tract: Adrenal glands are unremarkable. Kidneys are normal, without renal calculi, focal lesion, or hydronephrosis. Foley catheter present within the bladder. Stomach/Bowel: No focal bowel wall thickening or evidence of obstruction. Vascular/Lymphatic: Limited evaluation in the absence of intravenous contrast. No aneurysm. No suspicious lymphadenopathy. Reproductive: Prostate is unremarkable. Other: No abdominal wall hernia or abnormality. No abdominopelvic ascites. Musculoskeletal: No acute fracture or aggressive appearing lytic or blastic osseous lesion. IMPRESSION: 1. Cholelithiasis with questionable gallbladder wall thickening in a region of artifact. Acute cholecystitis is a consideration. Consider further evaluation with right upper quadrant abdominal ultrasound. 2. Otherwise, no acute abnormality within the abdomen or pelvis. 3. Additional ancillary findings as above. Electronically  Signed   By: Heath  McCullough M.D.   On: 03/09/2022 08:07   DG Chest Port 1 View  Result Date: 03/09/2022 CLINICAL DATA:  Malaise EXAM: PORTABLE CHEST 1 VIEW COMPARISON:  12/29/2021 FINDINGS: Heart and mediastinal contours are within normal limits. No focal opacities or effusions. No acute bony abnormality. IMPRESSION: No active disease. Electronically Signed   By: Kevin  Dover M.D.   On: 03/09/2022 02:05    Microbiology: Results for orders placed or performed during the hospital encounter of 03/09/22  Culture, blood (Routine X 2) w Reflex to ID Panel     Status: None   Collection Time: 03/09/22  4:58 PM   Specimen: BLOOD  Result Value Ref Range Status   Specimen Description   Final    BLOOD RIGHT ANTECUBITAL Performed at [facility], [address] [place], [place] [address]    Special Requests   Final    BOTTLES DRAWN   AEROBIC AND ANAEROBIC Blood Culture adequate volume Performed at [facility], [address] [place], [place] [address]    Culture   Final    NO GROWTH 5 DAYS Performed at [facility], [address] [place], [place] [address]    Report Status 03/14/2022 FINAL  Final  Culture, blood (Routine X 2) w Reflex to ID Panel     Status: None   Collection Time: 03/09/22  5:05 PM   Specimen: BLOOD RIGHT HAND  Result Value Ref Range Status   Specimen Description   Final    BLOOD RIGHT HAND Performed at [facility], [address] [place], [place] [address]    Special Requests   Final    BOTTLES DRAWN AEROBIC AND ANAEROBIC Blood Culture adequate volume Performed at [facility], [address] [place], [place] [address]    Culture   Final    NO GROWTH 5 DAYS Performed at [facility], [address] [place], [place] [address]    Report Status 03/14/2022 FINAL  Final  Gastrointestinal Panel by PCR , Stool     Status: Abnormal   Collection Time: 03/09/22  6:24 PM   Specimen: Stool   Result Value Ref Range Status   Campylobacter species NOT DETECTED NOT DETECTED Final   Plesimonas shigelloides NOT DETECTED NOT DETECTED Final   Salmonella species DETECTED (A) NOT DETECTED Final    Comment: RESULT CALLED TO, READ BACK BY AND VERIFIED WITH: C/GREGORY COOPER 03/10/22 1736 AMK    Yersinia enterocolitica NOT DETECTED NOT DETECTED Corrected    Comment: CORRECTED ON 12/04 AT 0912: PREVIOUSLY REPORTED AS NOT DETECTED RESULT CALLED TO, READ BACK BY AND VERIFIED WITH: C/GREGORY COOPER 03/10/22 1736 AMK   Vibrio species NOT DETECTED NOT DETECTED Final   Vibrio cholerae NOT DETECTED NOT DETECTED Final   Enteroaggregative E coli (EAEC) NOT DETECTED NOT DETECTED Final   Enteropathogenic E coli (EPEC) DETECTED (A) NOT DETECTED Final    Comment: RESULT CALLED TO, READ BACK BY AND VERIFIED WITH: C/GREGORY COOPER 03/10/22 1736 AMK    Enterotoxigenic E coli (ETEC) NOT DETECTED NOT DETECTED Final   Shiga like toxin producing E coli (STEC) NOT DETECTED NOT DETECTED Final   Shigella/Enteroinvasive E coli (EIEC) NOT DETECTED NOT DETECTED Final   Cryptosporidium NOT DETECTED NOT DETECTED Final   Cyclospora cayetanensis NOT DETECTED NOT DETECTED Final   Entamoeba histolytica NOT DETECTED NOT DETECTED Final   Giardia lamblia NOT DETECTED NOT DETECTED Final   Adenovirus F40/41 NOT DETECTED NOT DETECTED Final   Astrovirus NOT DETECTED NOT DETECTED Final   Norovirus GI/GII NOT DETECTED NOT DETECTED Final   Rotavirus A NOT DETECTED NOT DETECTED Final   Sapovirus (I, II, IV, and V) NOT DETECTED NOT DETECTED Final    Comment: Performed at [facility], [address] [place], [place] [address]  C Difficile Quick Screen w PCR reflex     Status: None   Collection Time: 03/10/22 12:11 PM   Specimen: STOOL  Result Value Ref Range Status   C Diff antigen NEGATIVE NEGATIVE Final   C Diff toxin NEGATIVE NEGATIVE Final   C Diff interpretation No C. difficile detected.  Final    Comment:  Performed at [facility], [address] , [place] [address]   *Note: Due to a large number of results and/or encounters for the requested time period, some results have not been displayed. A complete set of   results can be found in Results Review.    Labs: CBC: Recent Labs  Lab 03/12/22 0323 03/14/22 0744 03/16/22 1152  WBC 8.0 6.6 8.9  NEUTROABS 5.1  --   --   HGB 9.5* 9.6* 9.8*  HCT 29.0* 29.1* 29.5*  MCV 81.9 81.3 82.2  PLT 316 334 865   Basic Metabolic Panel: Recent Labs  Lab 03/12/22 0323 03/13/22 0219 03/14/22 0744 03/15/22 0257 03/16/22 1152  NA 135 134* 134* 135 133*  K 3.5 3.0* 3.1* 3.3* 2.9*  CL 104 98 104 98 98  CO2 13* 19* 17* 22 21*  GLUCOSE 106* 221* 174* 193* 181*  BUN 93* 67* 69* 42* 51*  CREATININE 12.91* 9.70* 10.06* 7.43* 9.44*  CALCIUM 6.2*  6.0 6.9* 6.8* 7.1* 7.3*  PHOS 7.7* 5.0* 5.9*  --  5.3*   Liver Function Tests: Recent Labs  Lab 03/12/22 0323 03/13/22 0219 03/14/22 0744 03/16/22 1152  ALBUMIN 2.2* 2.1* 2.2* 2.3*   CBG: Recent Labs  Lab 03/17/22 0845 03/17/22 1201 03/17/22 1628 03/17/22 2141 03/18/22 0830  GLUCAP 163* 147* 152* 85 234*    Discharge time spent: greater than 30 minutes.  Signed: Patrecia Pour, MD [facility] 03/18/2022

## 2022-03-22 ENCOUNTER — Telehealth: Payer: Self-pay | Admitting: Orthopedic Surgery

## 2022-03-22 NOTE — Telephone Encounter (Signed)
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  Patient asked for a call back as soon as possible. The number to contact patient is [phone]

## 2022-03-27 NOTE — Telephone Encounter (Signed)
sent 

## 2022-04-03 ENCOUNTER — Emergency Department (HOSPITAL_COMMUNITY)
Admission: EM | Admit: 2022-04-03 | Discharge: 2022-04-03 | Disposition: A | Discharge status: Home / Self Care | Payer: Medicare Other | Attending: Emergency Medicine | Admitting: Emergency Medicine

## 2022-04-03 ENCOUNTER — Encounter (HOSPITAL_COMMUNITY): Payer: Self-pay | Admitting: Emergency Medicine

## 2022-04-03 ENCOUNTER — Other Ambulatory Visit: Payer: Self-pay

## 2022-04-03 DIAGNOSIS — Z7902 Long term (current) use of antithrombotics/antiplatelets: Secondary | ICD-10-CM | POA: Diagnosis not present

## 2022-04-03 DIAGNOSIS — Z7982 Long term (current) use of aspirin: Secondary | ICD-10-CM | POA: Insufficient documentation

## 2022-04-03 DIAGNOSIS — Z89512 Acquired absence of left leg below knee: Secondary | ICD-10-CM | POA: Insufficient documentation

## 2022-04-03 DIAGNOSIS — Z21 Asymptomatic human immunodeficiency virus [HIV] infection status: Secondary | ICD-10-CM | POA: Insufficient documentation

## 2022-04-03 DIAGNOSIS — Z794 Long term (current) use of insulin: Secondary | ICD-10-CM | POA: Insufficient documentation

## 2022-04-03 DIAGNOSIS — Z79899 Other long term (current) drug therapy: Secondary | ICD-10-CM | POA: Diagnosis not present

## 2022-04-03 DIAGNOSIS — Z992 Dependence on renal dialysis: Secondary | ICD-10-CM | POA: Insufficient documentation

## 2022-04-03 DIAGNOSIS — Z89511 Acquired absence of right leg below knee: Secondary | ICD-10-CM | POA: Diagnosis not present

## 2022-04-03 DIAGNOSIS — E1122 Type 2 diabetes mellitus with diabetic chronic kidney disease: Secondary | ICD-10-CM | POA: Diagnosis not present

## 2022-04-03 DIAGNOSIS — N189 Chronic kidney disease, unspecified: Secondary | ICD-10-CM | POA: Insufficient documentation

## 2022-04-03 DIAGNOSIS — E1165 Type 2 diabetes mellitus with hyperglycemia: Secondary | ICD-10-CM | POA: Insufficient documentation

## 2022-04-03 DIAGNOSIS — E871 Hypo-osmolality and hyponatremia: Secondary | ICD-10-CM | POA: Diagnosis not present

## 2022-04-03 DIAGNOSIS — I129 Hypertensive chronic kidney disease with stage 1 through stage 4 chronic kidney disease, or unspecified chronic kidney disease: Secondary | ICD-10-CM | POA: Insufficient documentation

## 2022-04-03 DIAGNOSIS — R739 Hyperglycemia, unspecified: Secondary | ICD-10-CM

## 2022-04-03 LAB — I-STAT VENOUS BLOOD GAS, ED
Acid-Base Excess: 1 mmol/L (ref 0.0–2.0)
Bicarbonate: 26.5 mmol/L (ref 20.0–28.0)
Calcium, Ion: 0.99 mmol/L — ABNORMAL LOW (ref 1.15–1.40)
HCT: 38 % — ABNORMAL LOW (ref 39.0–52.0)
Hemoglobin: 12.9 g/dL — ABNORMAL LOW (ref 13.0–17.0)
O2 Saturation: 90 %
Potassium: 4.2 mmol/L (ref 3.5–5.1)
Sodium: 127 mmol/L — ABNORMAL LOW (ref 135–145)
TCO2: 28 mmol/L (ref 22–32)
pCO2, Ven: 45.4 mmHg (ref 44–60)
pH, Ven: 7.374 (ref 7.25–7.43)
pO2, Ven: 61 mmHg — ABNORMAL HIGH (ref 32–45)

## 2022-04-03 LAB — CBC WITH DIFFERENTIAL/PLATELET
Abs Immature Granulocytes: 0.25 10*3/uL — ABNORMAL HIGH (ref 0.00–0.07)
Basophils Absolute: 0 10*3/uL (ref 0.0–0.1)
Basophils Relative: 0 %
Eosinophils Absolute: 0 10*3/uL (ref 0.0–0.5)
Eosinophils Relative: 0 %
HCT: 35.5 % — ABNORMAL LOW (ref 39.0–52.0)
Hemoglobin: 10.7 g/dL — ABNORMAL LOW (ref 13.0–17.0)
Immature Granulocytes: 3 %
Lymphocytes Relative: 15 %
Lymphs Abs: 1.5 10*3/uL (ref 0.7–4.0)
MCH: 26.6 pg (ref 26.0–34.0)
MCHC: 30.1 g/dL (ref 30.0–36.0)
MCV: 88.3 fL (ref 80.0–100.0)
Monocytes Absolute: 0.6 10*3/uL (ref 0.1–1.0)
Monocytes Relative: 5 %
Neutro Abs: 7.8 10*3/uL — ABNORMAL HIGH (ref 1.7–7.7)
Neutrophils Relative %: 77 %
Platelets: 278 10*3/uL (ref 150–400)
RBC: 4.02 MIL/uL — ABNORMAL LOW (ref 4.22–5.81)
RDW: 14.1 % (ref 11.5–15.5)
WBC: 10.2 10*3/uL (ref 4.0–10.5)
nRBC: 0.9 % — ABNORMAL HIGH (ref 0.0–0.2)

## 2022-04-03 LAB — I-STAT CHEM 8, ED
BUN: 43 mg/dL — ABNORMAL HIGH (ref 6–20)
Calcium, Ion: 1 mmol/L — ABNORMAL LOW (ref 1.15–1.40)
Chloride: 91 mmol/L — ABNORMAL LOW (ref 98–111)
Creatinine, Ser: 5.5 mg/dL — ABNORMAL HIGH (ref 0.61–1.24)
Glucose, Bld: 584 mg/dL (ref 70–99)
HCT: 38 % — ABNORMAL LOW (ref 39.0–52.0)
Hemoglobin: 12.9 g/dL — ABNORMAL LOW (ref 13.0–17.0)
Potassium: 4.2 mmol/L (ref 3.5–5.1)
Sodium: 128 mmol/L — ABNORMAL LOW (ref 135–145)
TCO2: 26 mmol/L (ref 22–32)

## 2022-04-03 LAB — BASIC METABOLIC PANEL
Anion gap: 9 (ref 5–15)
BUN: 37 mg/dL — ABNORMAL HIGH (ref 6–20)
CO2: 24 mmol/L (ref 22–32)
Calcium: 8 mg/dL — ABNORMAL LOW (ref 8.9–10.3)
Chloride: 93 mmol/L — ABNORMAL LOW (ref 98–111)
Creatinine, Ser: 4.61 mg/dL — ABNORMAL HIGH (ref 0.61–1.24)
GFR, Estimated: 14 mL/min — ABNORMAL LOW (ref >60)
Glucose, Bld: 445 mg/dL — ABNORMAL HIGH (ref 70–99)
Potassium: 4.2 mmol/L (ref 3.5–5.1)
Sodium: 126 mmol/L — ABNORMAL LOW (ref 135–145)

## 2022-04-03 LAB — CBG MONITORING, ED
Glucose-Capillary: 359 mg/dL — ABNORMAL HIGH (ref 70–99)
Glucose-Capillary: 409 mg/dL — ABNORMAL HIGH (ref 70–99)
Glucose-Capillary: 543 mg/dL (ref 70–99)

## 2022-04-03 MED ORDER — INSULIN ASPART 100 UNIT/ML IJ SOLN
10.0000 [IU] | Freq: Once | INTRAMUSCULAR | Status: AC
  Administered 2022-04-03: 10 [IU] via INTRAVENOUS

## 2022-04-03 MED ORDER — INSULIN ASPART 100 UNIT/ML IJ SOLN: 5.0000 [IU] | Freq: Once | INTRAMUSCULAR | Status: DC

## 2022-04-03 MED ORDER — LACTATED RINGERS IV BOLUS
1000.0000 mL | Freq: Once | INTRAVENOUS | Status: AC
  Administered 2022-04-03: 1000 mL via INTRAVENOUS

## 2022-04-03 MED ORDER — INSULIN ASPART 100 UNIT/ML IJ SOLN
6.0000 [IU] | Freq: Once | INTRAMUSCULAR | Status: AC
  Administered 2022-04-03: 6 [IU] via SUBCUTANEOUS

## 2022-04-03 NOTE — ED Provider Notes (Signed)
Care of patient assumed from Dr. Jeanell Sparrow.  This patient was at the skilled nursing facility.  He presents to the ED for elevated blood sugar.  Workup shows no complications from hyperglycemia.  He is currently ordered fluids and insulin.  He will need recheck of his sugar.  Discharge is anticipated. Physical Exam  BP (!) 153/82 (BP Location: Right Arm)   Pulse 67   Temp 98.3 F (36.8 C) (Oral)   Resp 18   Wt 115 kg   SpO2 97%   BMI 36.38 kg/m   Physical Exam Vitals and nursing note reviewed.  Constitutional:      General: He is not in acute distress.    Appearance: Normal appearance. He is well-developed. He is not ill-appearing, toxic-appearing or diaphoretic.  HENT:     Head: Normocephalic and atraumatic.     Right Ear: External ear normal.     Left Ear: External ear normal.     Mouth/Throat:     Mouth: Mucous membranes are moist.  Eyes:     Extraocular Movements: Extraocular movements intact.     Conjunctiva/sclera: Conjunctivae normal.  Cardiovascular:     Rate and Rhythm: Normal rate and regular rhythm.  Pulmonary:     Effort: Pulmonary effort is normal. No respiratory distress.  Abdominal:     General: There is no distension.  Musculoskeletal:        General: Normal range of motion.     Cervical back: Normal range of motion and neck supple. No rigidity.  Skin:    General: Skin is warm and dry.     Capillary Refill: Capillary refill takes less than 2 seconds.  Neurological:     General: No focal deficit present.     Mental Status: He is alert and oriented to person, place, and time.  Psychiatric:        Mood and Affect: Mood normal.        Behavior: Behavior normal.        Thought Content: Thought content normal.        Judgment: Judgment normal.     Procedures  Procedures  ED Course / MDM    Medical Decision Making Amount and/or Complexity of Data Reviewed Labs: ordered.  Risk Prescription drug management.   On assessment, patient sitting on ED  stretcher in hallway bed.  He has no complaints.  After dose of insulin and IV fluids, in addition to a sandwich, blood sugar was 359.  Additional subcutaneous dose of insulin was ordered.  Patient is stable for discharge at this time.       Godfrey Pick, MD 04/03/22 [phone]

## 2022-04-03 NOTE — ED Provider Notes (Signed)
[facility] EMERGENCY DEPARTMENT Provider Note   CSN: [id number] Arrival date & time: 04/03/22  0036     History  Chief Complaint  Patient presents with   Hyperglycemia    Keith [name] is a 56 y.o. male.  HPI   56 year old male history of type 2 diabetes, HIV disease, CKD, on dialysis, high blood pressure, hyponatremia presents today from skilled nursing facility with reports that his blood sugar was high.  He reports that he was diagnosed with COVID last week.  He did have some nasal congestion and cough at that time.  He reports that these symptoms have improved.  He reports eating and drinking and getting his usual medications.  It is that he felt a little lightheaded yesterday.  He has been here in the ED for approximately 12 hours and I reviewed the labs that were obtained on his initial admission  Home Medications Prior to Admission medications   Medication Sig Start Date End Date Taking? Authorizing Provider  allopurinol (ZYLOPRIM) 100 MG tablet Take 100 mg by mouth daily.    [provider]  aspirin EC 81 MG tablet Take 81 mg by mouth daily. Swallow whole.    [provider]  atorvastatin (LIPITOR) 80 MG tablet Take 1 tablet (80 mg total) by mouth daily. 09/01/20 03/09/22  [name], MD  bictegravir-emtricitabine-tenofovir AF (BIKTARVY) 50-200-25 MG TABS tablet Take 1 tablet by mouth daily with supper. 03/18/22   Patrecia Pour, MD  calcium acetate (PHOSLO) 667 MG capsule Take 2 capsules (1,334 mg total) by mouth 3 (three) times daily with meals. 03/18/22   Patrecia Pour, MD  clopidogrel (PLAVIX) 75 MG tablet Take 1 tablet (75 mg total) by mouth daily. 11/15/21   Jettie Booze, MD  escitalopram (LEXAPRO) 20 MG tablet Take 1 tablet (20 mg total) by mouth daily. 10/19/21   Hosie Poisson, MD  ferrous sulfate 325 (65 FE) MG EC tablet Take 325 mg by mouth daily with breakfast.    [provider]  finasteride (PROSCAR) 5 MG tablet  Take 5 mg by mouth daily. 08/20/21   [provider]  insulin aspart (NOVOLOG) 100 UNIT/ML injection Inject 0-9 Units into the skin 3 (three) times daily with meals. 03/18/22   Patrecia Pour, MD  insulin glargine (LANTUS) 100 UNIT/ML Solostar Pen Inject 10 Units into the skin daily. 03/18/22   Patrecia Pour, MD  Lancets [facility] ULTRASOFT) lancets Use as instructed 02/16/20   [name], FNP  oxyCODONE-acetaminophen (PERCOCET/ROXICET) 5-325 MG tablet Take 1 tablet by mouth every 8 (eight) hours as needed for severe pain. 03/18/22   Patrecia Pour, MD  tamsulosin (FLOMAX) 0.4 MG CAPS capsule Take 1 capsule (0.4 mg total) by mouth daily after supper. 03/18/22   Patrecia Pour, MD      Allergies    Elemental sulfur, Metformin and related, Zetia [ezetimibe], and Sulfa antibiotics    Review of Systems   Review of Systems  Physical Exam Updated Vital Signs BP (!) 153/82 (BP Location: Right Arm)   Pulse 67   Temp 98.3 F (36.8 C) (Oral)   Resp 18   Wt 115 kg   SpO2 97%   BMI 36.38 kg/m  Physical Exam Vitals and nursing note reviewed.  Constitutional:      General: He is not in acute distress.    Appearance: Normal appearance.  HENT:     Head: Normocephalic.     Right Ear: External ear  normal.     Left Ear: External ear normal.     Nose: Nose normal.     Mouth/Throat:     Comments: Mask in place Eyes:     Extraocular Movements: Extraocular movements intact.     Pupils: Pupils are equal, round, and reactive to light.  Cardiovascular:     Rate and Rhythm: Normal rate and regular rhythm.     Pulses: Normal pulses.  Pulmonary:     Effort: Pulmonary effort is normal.     Breath sounds: Normal breath sounds.  Abdominal:     General: Bowel sounds are normal.     Palpations: Abdomen is soft.     Tenderness: There is no abdominal tenderness.  Musculoskeletal:        General: Normal range of motion.     Cervical back: Normal range of motion.     Comments: Right BKA  left AKA no breakdown to skin, redness, swelling or tenderness noted Bilateral upper extremities appear normal with radial pulses intact  Skin:    General: Skin is warm and dry.     Capillary Refill: Capillary refill takes less than 2 seconds.  Neurological:     General: No focal deficit present.     Mental Status: He is alert.  Psychiatric:        Mood and Affect: Mood normal.     ED Results / Procedures / Treatments   Labs (all labs ordered are listed, but only abnormal results are displayed) Labs Reviewed  CBC WITH DIFFERENTIAL/PLATELET - Abnormal; Notable for the following components:      Result Value   RBC 4.02 (*)    Hemoglobin 10.7 (*)    HCT 35.5 (*)    nRBC 0.9 (*)    Neutro Abs 7.8 (*)    Abs Immature Granulocytes 0.25 (*)    All other components within normal limits  BASIC METABOLIC PANEL - Abnormal; Notable for the following components:   Sodium 126 (*)    Chloride 93 (*)    Glucose, Bld 445 (*)    BUN 37 (*)    Creatinine, Ser 4.61 (*)    Calcium 8.0 (*)    GFR, Estimated 14 (*)    All other components within normal limits  CBG MONITORING, ED - Abnormal; Notable for the following components:   Glucose-Capillary 543 (*)    All other components within normal limits  I-STAT CHEM 8, ED - Abnormal; Notable for the following components:   Sodium 128 (*)    Chloride 91 (*)    BUN 43 (*)    Creatinine, Ser 5.50 (*)    Glucose, Bld 584 (*)    Calcium, Ion 1.00 (*)    Hemoglobin 12.9 (*)    HCT 38.0 (*)    All other components within normal limits  I-STAT VENOUS BLOOD GAS, ED - Abnormal; Notable for the following components:   pO2, Ven 61 (*)    Sodium 127 (*)    Calcium, Ion 0.99 (*)    HCT 38.0 (*)    Hemoglobin 12.9 (*)    All other components within normal limits  BLOOD GAS, VENOUS  CBG MONITORING, ED    EKG None  Radiology No results found.  Procedures Procedures    Medications Ordered in ED Medications  lactated ringers bolus 1,000 mL  (has no administration in time range)  insulin aspart (novoLOG) injection 10 Units (has no administration in time range)    ED Course/ Medical Decision Making/  A&P                           Medical Decision Making 56 year old male with multiple and complicated past medical history who is living in a nursing facility presents today with hyperglycemia.  Patient was diagnosed with COVID last week.  He states that he did have some fever and congestion but those symptoms are improving.  He reports eating and drinking as usual he reports receiving his usual medications.  He is not on dialysis and has not missed any of his dialysis sessions.  He reports some generalized weakness and lightheadedness. Here is evaluated with labs.  He has significantly hyperglycemic with associated hyponatremia. pH is normal and does not appear to have DKA Patient has had fluids and insulin ordered.  These are pending Signed out to ongoing coming team who will evaluate after fluids and insulin.  Patient may be discharged home with electrolytes and glucose improve  Amount and/or Complexity of Data Reviewed Labs: ordered. Decision-making details documented in ED Course. Radiology: ordered and independent interpretation performed. Decision-making details documented in ED Course.  Risk Prescription drug management.   IV fluids and insulin pending No evidence of dka         Final Clinical Impression(s) / ED Diagnoses Final diagnoses:  Hyperglycemia    Rx / DC Orders ED Discharge Orders     None         Pattricia Boss, MD 04/03/22 1706

## 2022-04-03 NOTE — ED Triage Notes (Addendum)
Presents via EMS for HTN (180/100) and hyperglycemic ("high", given 16 units insulin novolog at [facility] which improved to 534).  Pt is complaining of dizziness; he is covid positive since unknown date per EMS.  HD today T-TH-Sat, no missed treatments.   EMS VS: 84bpm, 98% RA, 18RR, 145 palp BP  Takes plavix, does endorse slipping out of bed onto bottom but denies pain in this area or any head injury from this.

## 2022-04-03 NOTE — ED Notes (Signed)
IV at bedside. 

## 2022-04-03 NOTE — ED Provider Triage Note (Signed)
  Emergency Medicine Provider Triage Evaluation Note  MRN:  [id number]  Arrival date & time: 04/03/22    Medically screening exam initiated at 12:53 AM.   CC:   Hyperglycemia   HPI:  Keith [name] is a 56 y.o. year-old male presents to the ED with chief complaint of hyperglycemia.  States that his blood sugar and blood pressure has been high all day.   Dialyzed earlier today.  Reports mild SOB.  History provided by patient. ROS:  -As included in HPI PE:   Vitals:   04/03/22 0045  BP: 137/81  Pulse: 72  Resp: 16  Temp: 98.1 F (36.7 C)  SpO2: 99%    Non-toxic appearing No respiratory distress  MDM:  Based on signs and symptoms, hyperglycemia is highest on my differential. I've ordered labs in triage to expedite lab/diagnostic workup.  Patient was informed that the remainder of the evaluation will be completed by another provider, this initial triage assessment does not replace that evaluation, and the importance of remaining in the ED until their evaluation is complete.    Montine Circle, PA-C 04/03/22 [phone]

## 2022-04-03 NOTE — ED Notes (Signed)
Called facility x1 for report, phone rang continuously with no answer.

## 2022-04-03 NOTE — Discharge Instructions (Signed)
Continue taking your home medication use as prescribed.  Talk to your primary care doctor and staff at your facility about increasing your long-acting insulin or changes in correction doses of your short acting insulin.  Return to the emergency department for any new or worsening symptoms of concern.

## 2022-04-10 ENCOUNTER — Other Ambulatory Visit: Payer: Self-pay | Admitting: *Deleted

## 2022-04-10 DIAGNOSIS — N184 Chronic kidney disease, stage 4 (severe): Secondary | ICD-10-CM

## 2022-04-15 ENCOUNTER — Other Ambulatory Visit: Payer: Self-pay

## 2022-04-15 ENCOUNTER — Ambulatory Visit (INDEPENDENT_AMBULATORY_CARE_PROVIDER_SITE_OTHER): Payer: 59 | Admitting: Internal Medicine

## 2022-04-15 ENCOUNTER — Encounter: Payer: Self-pay | Admitting: Internal Medicine

## 2022-04-15 VITALS — BP 189/99 | HR 75

## 2022-04-15 DIAGNOSIS — B20 Human immunodeficiency virus [HIV] disease: Secondary | ICD-10-CM | POA: Diagnosis not present

## 2022-04-15 MED ORDER — BICTEGRAVIR-EMTRICITAB-TENOFOV 50-200-25 MG PO TABS: 1.0000 | ORAL_TABLET | Freq: Every day | ORAL | 5 refills | Status: DC

## 2022-04-15 MED ORDER — DAPSONE 100 MG PO TABS: 100.0000 mg | ORAL_TABLET | Freq: Every day | ORAL | 5 refills | Status: AC

## 2022-04-15 NOTE — Progress Notes (Signed)
[name]     HPI: Keith [name] is a 57 y.o. male presents for HIV management. Recently hospitalized with diarrhea , salmonella + GIP. Discharged on biktarvy, per record review of SNF notes pt is on dovato and Pifeltro. Diarrhea resolved. Date of diagnosis 1999, while incarcerated ART exposure prezcobix and triumeq (he had genotype 05-2018 showing background mutations, geno 11-2009 showing no mutations, 2010 M184V  Biktarvy switched  to in 2021 In the setting of ATN he was switched to Dovato+ pelfetri then back to biktarvy when started HD again Past OIs Risk factors: sex Partners in last 2 months 0, in the last 57 months1.  Oral sex -no Vaginal penile sex, contraception all  Social: Pt is at rehab for his leg, plan to return home Etoh/drug/tobacco use: NO/no/no  [place]? Hep B vaxx, pap Past Medical History:  Diagnosis Date   Abscess of right foot    abscess/ulcer of R transtibial amputation requiring IV abx   Acute ST elevation myocardial infarction (STEMI) due to occlusion of circumflex coronary artery ([place]) 03/22/2020   AIDS ([place])    Anemia    CAD (coronary artery disease)    a. MI with stenting of OM1 in 11/2018 with residual disease. b. acute STEMI 03/2020 s/p DES to OM1   Chronic anemia    Chronic knee pain    right   Chronic pain    CKD (chronic kidney disease), stage IV (HCC)    CVA (cerebral vascular accident) ([place]) 04/2020   Diabetes type 2, uncontrolled    HgA1c 17.6 (04/27/2010)   Diabetic foot ulcer ([place]) 01/2017   right foot   Dilatation of aorta (HCC)    Erectile dysfunction    Genital warts    GERD (gastroesophageal reflux disease)    History of blood transfusion    HIV (human immunodeficiency virus infection) ([place]) 2009   CD4 count 100, VL 13800 (05/01/2010)   Hyperlipidemia    Hypertension    Myocardial infarction [facility])    Neuropathy    Noncompliance with medication regimen    Osteomyelitis ([place])    h/o  hand   Osteomyelitis of metatarsal ([place]) 04/28/2017   Pneumonia    STEMI (ST elevation myocardial infarction) ([place]) 11/10/2018    Past Surgical History:  Procedure Laterality Date   AMPUTATION Right 05/02/2017   Procedure: AMPUTATION TRANSMETARSAL;  Surgeon: [name], MD;  Location: [place];  Service: Orthopedics;  Laterality: Right;   AMPUTATION Right 06/13/2017   Procedure: RIGHT BELOW KNEE AMPUTATION;  Surgeon: [name], MD;  Location: [place];  Service: Orthopedics;  Laterality: Right;   AMPUTATION Left 10/27/2020   Procedure: LEFT BELOW KNEE AMPUTATION;  Surgeon: [name], MD;  Location: [place];  Service: Orthopedics;  Laterality: Left;   AMPUTATION Left 11/17/2020   Procedure: REVISION AMPUTATION BELOW KNEE, LEFT;  Surgeon: [name], MD;  Location: [place];  Service: Orthopedics;  Laterality: Left;   AMPUTATION Left 01/03/2021   Procedure: LEFT ABOVE KNEE AMPUTATION;  Surgeon: [name], MD;  Location: [place];  Service: Orthopedics;  Laterality: Left;   APPLICATION OF WOUND VAC Left 10/27/2020   Procedure: APPLICATION OF WOUND VAC;  Surgeon: [name], MD;  Location: [place];  Service: Orthopedics;  Laterality: Left;   AV FISTULA PLACEMENT Left 03/14/2022   Procedure: LEFT BRACHIOCEPHALIC ARTERIOVENOUS (AV) FISTULA CREATION;  Surgeon: Broadus John, MD;  Location: [place];  Service:  Vascular;  Laterality: Left;  PERIPHERAL NERVE BLOCK   BELOW KNEE LEG AMPUTATION Right 06/13/2017   BELOW KNEE LEG AMPUTATION     CORONARY STENT INTERVENTION N/A 11/10/2018   Procedure: CORONARY STENT INTERVENTION;  Surgeon: Leonie Man, MD;  Location: [place] [facility];  Service: Cardiovascular;  Laterality: N/A;   CORONARY/GRAFT ACUTE MI REVASCULARIZATION N/A 03/21/2020   Procedure: Coronary/Graft Acute MI Revascularization;  Surgeon: [name], MD;  Location: [place] [facility];  Service: Cardiovascular;  Laterality: N/A;   CYSTOSCOPY N/A 11/09/2020   Procedure: CYSTOSCOPY,  EVACUATION OF CLOTS, FULGERATION OF BLADDER AND BILATERIAL RETROGRADE PYELOGRAMS;  Surgeon: Janith Lima, MD;  Location: [place];  Service: Urology;  Laterality: N/A;   HERNIA REPAIR     I & D EXTREMITY Left 08/21/2014   Procedure: INCISION AND DRAINAGE LEFT SMALL FINGER;  Surgeon: Leanora Cover, MD;  Location: [place];  Service: Orthopedics;  Laterality: Left;   I & D EXTREMITY Right 03/18/2017   Procedure: IRRIGATION AND DEBRIDEMENT EXTREMITY;  Surgeon: [name], MD;  Location: [place];  Service: Orthopedics;  Laterality: Right;   IR FLUORO GUIDE CV LINE RIGHT  11/02/2020   IR FLUORO GUIDE CV LINE RIGHT  03/12/2022   IR REMOVAL TUN CV CATH W/O FL  11/13/2020   IR US GUIDE VASC ACCESS RIGHT  11/02/2020   IR US GUIDE VASC ACCESS RIGHT  03/12/2022   IR VENOCAVAGRAM SVC  03/12/2022   LEFT HEART CATH AND CORONARY ANGIOGRAPHY N/A 11/10/2018   Procedure: LEFT HEART CATH AND CORONARY ANGIOGRAPHY;  Surgeon: Leonie Man, MD;  Location: [place] [facility];  Service: Cardiovascular;  Laterality: N/A;   LEFT HEART CATH AND CORONARY ANGIOGRAPHY N/A 03/21/2020   Procedure: LEFT HEART CATH AND CORONARY ANGIOGRAPHY;  Surgeon: [name], MD;  Location: [place] [facility];  Service: Cardiovascular;  Laterality: N/A;   MINOR IRRIGATION AND DEBRIDEMENT OF WOUND Right 04/22/2014   Procedure: IRRIGATION AND DEBRIDEMENT OF RIGHT NECK ABCESS;  Surgeon: Jerrell Belfast, MD;  Location: [place];  Service: ENT;  Laterality: Right;   MULTIPLE EXTRACTIONS WITH ALVEOLOPLASTY N/A 01/18/2013   Procedure: MULTIPLE EXTRACION 3, 6, 7, 10, 11, 13, 21, 22, 27, 28, 29, 30 WITH ALVEOLOPLASTY;  Surgeon: Gae Bon, DDS;  Location: [place];  Service: Oral Surgery;  Laterality: N/A;   SKIN SPLIT GRAFT Right 03/21/2017   Procedure: IRRIGATION AND DEBRIDEMENT RIGHT FOOT AND APPLY SPLIT THICKNESS SKIN GRAFT AND WOUND VAC;  Surgeon: [name], MD;  Location: [place];  Service: Orthopedics;  Laterality: Right;   STUMP REVISION Left  12/02/2020   Procedure: REVISION LEFT BELOW KNEE AMPUTATION;  Surgeon: [name], MD;  Location: [place];  Service: Orthopedics;  Laterality: Left;   TEE WITHOUT CARDIOVERSION N/A 05/02/2017   Procedure: TRANSESOPHAGEAL ECHOCARDIOGRAM (TEE);  Surgeon: Thayer Headings, MD;  Location: [place];  Service: Cardiovascular;  Laterality: N/A;  coincidental to orthopedic case    Family History  Problem Relation Age of Onset   Hypertension Mother    Arthritis Father    Hypertension Father    Hypertension Brother    Cancer Maternal Grandmother 70       unknown type of cancer   Depression Paternal Grandmother    Current Outpatient Medications on File Prior to Visit  Medication Sig Dispense Refill   allopurinol (ZYLOPRIM) 100 MG tablet Take 100 mg by mouth daily.     aspirin EC 81 MG tablet Take  81 mg by mouth daily. Swallow whole.     atorvastatin (LIPITOR) 80 MG tablet Take 1 tablet (80 mg total) by mouth daily. 30 tablet 1   bictegravir-emtricitabine-tenofovir AF (BIKTARVY) 50-200-25 MG TABS tablet Take 1 tablet by mouth daily with supper. 30 tablet 0   calcium acetate (PHOSLO) 667 MG capsule Take 2 capsules (1,334 mg total) by mouth 3 (three) times daily with meals.     clopidogrel (PLAVIX) 75 MG tablet Take 1 tablet (75 mg total) by mouth daily. 90 tablet 3   escitalopram (LEXAPRO) 20 MG tablet Take 1 tablet (20 mg total) by mouth daily. 30 tablet 3   ferrous sulfate 325 (65 FE) MG EC tablet Take 325 mg by mouth daily with breakfast.     finasteride (PROSCAR) 5 MG tablet Take 5 mg by mouth daily.     insulin aspart (NOVOLOG) 100 UNIT/ML injection Inject 0-9 Units into the skin 3 (three) times daily with meals.     insulin glargine (LANTUS) 100 UNIT/ML Solostar Pen Inject 10 Units into the skin daily. 1 mL    Lancets (ONETOUCH ULTRASOFT) lancets Use as instructed 100 each 12   oxyCODONE-acetaminophen (PERCOCET/ROXICET) 5-325 MG tablet Take 1 tablet by mouth every 8 (eight) hours as needed for  severe pain. 9 tablet 0   tamsulosin (FLOMAX) 0.4 MG CAPS capsule Take 1 capsule (0.4 mg total) by mouth daily after supper.     No current facility-administered medications on file prior to visit.    Allergies  Allergen Reactions   Elemental Sulfur Itching    Patient stated he's allergic to "sulfur" AND "sulfa"   Metformin And Related Other (See Comments)    Stopped by MD- affected kidneys   Zetia [Ezetimibe] Nausea Only   Sulfa Antibiotics Itching    Lab Results HIV 1 RNA Quant  Date Value  03/10/2022 8,540 copies/mL  08/17/2021 <20 DETECTED copies/mL (A)  02/13/2021 Not Detected Copies/mL   CD4 T Cell Abs (/uL)  Date Value  02/13/2021 166 (L)  12/12/2020 103 (L)  09/01/2019 89 (L)   Lab Results  Component Value Date   HIV1GENOSEQ REPORT 07/06/2014   Lab Results  Component Value Date   WBC 10.2 04/03/2022   HGB 12.9 (L) 04/03/2022   HCT 38.0 (L) 04/03/2022   MCV 88.3 04/03/2022   PLT 278 04/03/2022    Lab Results  Component Value Date   CREATININE 5.50 (H) 04/03/2022   BUN 43 (H) 04/03/2022   NA 127 (L) 04/03/2022   K 4.2 04/03/2022   CL 91 (L) 04/03/2022   CO2 24 04/03/2022   Lab Results  Component Value Date   ALT 23 03/09/2022   AST 33 03/09/2022   ALKPHOS 65 03/09/2022   BILITOT 0.6 03/09/2022    Lab Results  Component Value Date   CHOL 175 08/17/2021   TRIG 134 08/17/2021   HDL 45 08/17/2021   LDLCALC 105 (H) 08/17/2021   No results found for: "HAV" Lab Results  Component Value Date   HEPBSAG NON REACTIVE 03/11/2022   HEPBSAB NEG 11/20/2012   Lab Results  Component Value Date   HCVAB NON REACTIVE 11/02/2020   Lab Results  Component Value Date   CHLAMYDIAWP Negative 11/18/2016   N Negative 11/18/2016   No results found for: "GCPROBEAPT" No results found for: "QUANTGOLD"  Plan #HIV  #ESRD on HD -VL 8540 on 03/10/22, cd4 89(9% on 08/17/21) -Pt has -d/c Dovato, Pifeltro and start biktarvy(on and off biktarvy over the last  couple  'years). Pt on iHD through right subclavian x1 month -Start biktarvy and dapsone -Requested immunization from [facility]ily(flu/HBV) -f/u in one month   #Vaccination COVID Flu not 2023 at SNF Monkeypox PCV 13 10/15/18, PCV 23 03/16/22 Meningitis x1 05/13/16 HepA ab today HEpB sAg nr/sAB NR on 03/11/22, cab nr on 10/2820(needs 3rd dose->07/06/2014, 06/02/13 Tdap 05/30/2014 Shingles  #Health maintenance -Quantiferon today 04/15/22 -RPR NT 08/21/21 -HCV 11/02/20 NR -Colonoscopy discuss at next visit  I have personally spent 42 minutes involved in face-to-face and non-face-to-face activities for this patient on the day of the visit. Professional time spent includes the following activities: Preparing to see the patient (review of tests), Obtaining and/or reviewing separately obtained history (admission/discharge record), Performing a medically appropriate examination and/or evaluation , Ordering medications/tests/procedures, referring and communicating with other health care professionals, Documenting clinical information in the EMR, Independently interpreting results (not separately reported), Communicating results to the patient/family/caregiver, Counseling and educating the patient/family/caregiver and Care coordination (not separately reported).    Laurice Record, [place] [facility]

## 2022-04-17 LAB — CBC WITH DIFFERENTIAL/PLATELET
Absolute Monocytes: 762 cells/uL (ref 200–950)
Basophils Absolute: 30 cells/uL (ref 0–200)
Basophils Relative: 0.3 %
Eosinophils Absolute: 50 cells/uL (ref 15–500)
Eosinophils Relative: 0.5 %
HCT: 31.3 % — ABNORMAL LOW (ref 38.5–50.0)
Hemoglobin: 10.1 g/dL — ABNORMAL LOW (ref 13.2–17.1)
Lymphs Abs: 2346 cells/uL (ref 850–3900)
MCH: 27.4 pg (ref 27.0–33.0)
MCHC: 32.3 g/dL (ref 32.0–36.0)
MCV: 85.1 fL (ref 80.0–100.0)
MPV: 10.5 fL (ref 7.5–12.5)
Monocytes Relative: 7.7 %
Neutro Abs: 6712 cells/uL (ref 1500–7800)
Neutrophils Relative %: 67.8 %
Platelets: 255 10*3/uL (ref 140–400)
RBC: 3.68 10*6/uL — ABNORMAL LOW (ref 4.20–5.80)
RDW: 14.3 % (ref 11.0–15.0)
Total Lymphocyte: 23.7 %
WBC: 9.9 10*3/uL (ref 3.8–10.8)

## 2022-04-17 LAB — COMPREHENSIVE METABOLIC PANEL
AG Ratio: 0.9 (calc) — ABNORMAL LOW (ref 1.0–2.5)
ALT: 8 U/L — ABNORMAL LOW (ref 9–46)
AST: 11 U/L (ref 10–35)
Albumin: 3.5 g/dL — ABNORMAL LOW (ref 3.6–5.1)
Alkaline phosphatase (APISO): 85 U/L (ref 35–144)
BUN/Creatinine Ratio: 13 (calc) (ref 6–22)
BUN: 60 mg/dL — ABNORMAL HIGH (ref 7–25)
CO2: 24 mmol/L (ref 20–32)
Calcium: 8.5 mg/dL — ABNORMAL LOW (ref 8.6–10.3)
Chloride: 98 mmol/L (ref 98–110)
Creat: 4.47 mg/dL — ABNORMAL HIGH (ref 0.70–1.30)
Globulin: 3.9 g/dL (calc) — ABNORMAL HIGH (ref 1.9–3.7)
Glucose, Bld: 269 mg/dL — ABNORMAL HIGH (ref 65–99)
Potassium: 5.4 mmol/L — ABNORMAL HIGH (ref 3.5–5.3)
Sodium: 134 mmol/L — ABNORMAL LOW (ref 135–146)
Total Bilirubin: 0.3 mg/dL (ref 0.2–1.2)
Total Protein: 7.4 g/dL (ref 6.1–8.1)

## 2022-04-17 LAB — HELPER T-LYMPH-CD4 (ARMC ONLY)
% CD 4 Pos. Lymph.: 6.4 % — ABNORMAL LOW (ref 30.8–58.5)
Absolute CD 4 Helper: 141 /uL — ABNORMAL LOW (ref 359–1519)
Basophils Absolute: 0 10*3/uL (ref 0.0–0.2)
Basos: 0 %
EOS (ABSOLUTE): 0.1 10*3/uL (ref 0.0–0.4)
Eos: 1 %
Hematocrit: 32.8 % — ABNORMAL LOW (ref 37.5–51.0)
Hemoglobin: 10 g/dL — ABNORMAL LOW (ref 13.0–17.7)
Immature Grans (Abs): 0.1 10*3/uL (ref 0.0–0.1)
Immature Granulocytes: 1 %
Lymphocytes Absolute: 2.2 10*3/uL (ref 0.7–3.1)
Lymphs: 22 %
MCH: 26.5 pg — ABNORMAL LOW (ref 26.6–33.0)
MCHC: 30.5 g/dL — ABNORMAL LOW (ref 31.5–35.7)
MCV: 87 fL (ref 79–97)
Monocytes Absolute: 0.8 10*3/uL (ref 0.1–0.9)
Monocytes: 8 %
Neutrophils Absolute: 6.8 10*3/uL (ref 1.4–7.0)
Neutrophils: 68 %
Platelets: 254 10*3/uL (ref 150–450)
RBC: 3.78 x10E6/uL — ABNORMAL LOW (ref 4.14–5.80)
RDW: 14.2 % (ref 11.6–15.4)
WBC: 9.9 10*3/uL (ref 3.4–10.8)

## 2022-04-17 LAB — HEPATITIS A ANTIBODY, TOTAL: Hepatitis A AB,Total: REACTIVE — AB

## 2022-04-17 LAB — RPR: RPR Ser Ql: NONREACTIVE

## 2022-04-17 LAB — HIV-1 RNA QUANT-NO REFLEX-BLD
HIV 1 RNA Quant: NOT DETECTED Copies/mL
HIV-1 RNA Quant, Log: NOT DETECTED Log cps/mL

## 2022-04-19 ENCOUNTER — Encounter: Payer: Self-pay | Admitting: Physician Assistant

## 2022-04-19 ENCOUNTER — Ambulatory Visit (INDEPENDENT_AMBULATORY_CARE_PROVIDER_SITE_OTHER): Payer: 59 | Admitting: Physician Assistant

## 2022-04-19 ENCOUNTER — Encounter (HOSPITAL_COMMUNITY): Payer: Medicare Other

## 2022-04-19 ENCOUNTER — Ambulatory Visit (HOSPITAL_COMMUNITY)
Admission: RE | Admit: 2022-04-19 | Discharge: 2022-04-19 | Disposition: A | Discharge status: Home / Self Care | Payer: 59 | Source: Ambulatory Visit | Attending: Surgery | Admitting: Surgery

## 2022-04-19 VITALS — BP 121/78 | HR 78 | Temp 98.7°F | Resp 20 | Ht 70.0 in

## 2022-04-19 DIAGNOSIS — N184 Chronic kidney disease, stage 4 (severe): Secondary | ICD-10-CM

## 2022-04-19 DIAGNOSIS — N186 End stage renal disease: Secondary | ICD-10-CM

## 2022-04-19 DIAGNOSIS — Z992 Dependence on renal dialysis: Secondary | ICD-10-CM

## 2022-04-19 NOTE — Progress Notes (Signed)
Postoperative Access Visit   History of Present Illness   Keith [name] is a 57 y.o. year old male who presents for postoperative follow-up for: Left arm brachiocephalic fistula by Dr. Virl Cagey on 03/14/22. The patient's wounds are well healed.  The patient notes no steal symptoms.  The patient is able to complete their activities of daily living.  The does report not using left arm very much since his surgery.  He currently is on HD via right IJ TDC on TTS at [facility]   Physical Examination   Vitals:   04/19/22 0915  BP: 121/78  Pulse: 78  Resp: 20  Temp: 98.7 F (37.1 C)  TempSrc: Temporal  SpO2: 98%  Height: 5\' 10"  (1.778 m)   Body mass index is 36.38 kg/m.  left arm Incision is well healed, 2+ radial pulse, hand grip is 5/5, sensation in digits is intact, palpable thrill, bruit can be auscultated. Fistula is somewhat tortuous in the distal left upper arm and becomes deep in proximal upper arm   Non invasive vascular lab:  Findings:  +--------------------+----------+-----------------+--------+  AVF                PSV (cm/s)Flow Vol (mL/min)Comments  +--------------------+----------+-----------------+--------+  Native artery inflow   179          1804                 +--------------------+----------+-----------------+--------+  AVF Anastomosis        272                               +--------------------+----------+-----------------+--------+     +------------+----------+-------------+----------+-------------------------  ----+  OUTFLOW VEINPSV (cm/s)Diameter (cm)Depth (cm)          Describe              +------------+----------+-------------+----------+-------------------------  ----+  Shoulder      136        0.53        1.60                                   +------------+----------+-------------+----------+-------------------------  ----+  Prox UA        151        0.57        1.04                                    +------------+----------+-------------+----------+-------------------------  ----+  Mid UA         157        0.58        0.51    competing branch x 2 ,  0.25                                                      122 cm/s & 77cm/s         +------------+----------+-------------+----------+-------------------------  ----+  Dist UA        119        0.83        0.50  tortuous              +------------+----------+-------------+----------+-------------------------  ----+  AC Fossa       461        0.46        0.74                                   +------------+----------+-------------+----------+-------------------------  ----+     Summary: Patent arteriovenous fistula.   Medical Decision Making   Keith [name] is a 57 y.o. year old male who presents s/p Left arm brachiocephalic fistula by Dr. Virl Cagey on 03/14/22.Incision is well healed. No steal symptoms Duplex shows adequate volume flow. The fistula is not fully matured and is a little deeper then ideal. Patient has been avoiding using left arm for ADLs. I have recommended that he exercise the arm for the next 4 weeks and we will bring him back and repeat duplex. I discussed that if there is no improvement he may require angiogram vs. Surgical revision of the fistula prior to use. He understands and is agreeable to this plan. He will continue to use current right IJ [facility] for HD.    Karoline Caldwell, PA-C [facility] Office: [phone]  Clinic MD: Trula Slade

## 2022-04-24 ENCOUNTER — Other Ambulatory Visit: Payer: Self-pay

## 2022-04-24 DIAGNOSIS — N186 End stage renal disease: Secondary | ICD-10-CM

## 2022-05-10 ENCOUNTER — Ambulatory Visit: Payer: Medicaid Other

## 2022-05-10 ENCOUNTER — Ambulatory Visit (HOSPITAL_COMMUNITY): Payer: 59 | Attending: Vascular Surgery

## 2022-05-14 ENCOUNTER — Ambulatory Visit: Payer: Medicare Other | Admitting: Orthopedic Surgery

## 2022-05-15 ENCOUNTER — Ambulatory Visit: Payer: Medicare Other | Admitting: Internal Medicine

## 2022-05-15 NOTE — Progress Notes (Deleted)
[facility]ious [facility]     HPI: Keith [name] is a 57 y.o. male presents for HIV management. Recently hospitalized with diarrhea , salmonella + GIP. Discharged on biktarvy, per record review of SNF notes pt is on dovato and Pifeltro. Diarrhea resolved. Date of diagnosis 1999, while incarcerated ART exposure prezcobix and triumeq (he had genotype 05-2018 showing background mutations, geno 11-2009 showing no mutations, 2010 M184V  Biktarvy switched  to in 2021 In the setting of ATN he was switched to Dovato+ pelfetri then back to biktarvy when started HD again Past OIs Risk factors: sex Partners in last 2 months 0, in the last 21 months1.  Oral sex -no Vaginal penile sex, contraception all   Social: Pt is at rehab for his leg, plan to return home Etoh/drug/tobacco use: NO/no/no  Past Medical History:  Diagnosis Date   Abscess of right foot    abscess/ulcer of R transtibial amputation requiring IV abx   Acute ST elevation myocardial infarction (STEMI) due to occlusion of circumflex coronary artery ([place]) 03/22/2020   AIDS ([place])    Anemia    CAD (coronary artery disease)    a. MI with stenting of OM1 in 11/2018 with residual disease. b. acute STEMI 03/2020 s/p DES to OM1   Chronic anemia    Chronic knee pain    right   Chronic pain    CKD (chronic kidney disease), stage IV (HCC)    CVA (cerebral vascular accident) ([place]) 04/2020   Diabetes type 2, uncontrolled    HgA1c 17.6 (04/27/2010)   Diabetic foot ulcer ([place]) 01/2017   right foot   Dilatation of aorta (HCC)    Erectile dysfunction    Genital warts    GERD (gastroesophageal reflux disease)    History of blood transfusion    HIV (human immunodeficiency virus infection) ([place]) 2009   CD4 count 100, VL 13800 (05/01/2010)   Hyperlipidemia    Hypertension    Myocardial infarction [facility])    Neuropathy    Noncompliance with medication regimen    Osteomyelitis ([place])    h/o hand   Osteomyelitis of  metatarsal ([place]) 04/28/2017   Pneumonia    STEMI (ST elevation myocardial infarction) ([place]) 11/10/2018    Past Surgical History:  Procedure Laterality Date   AMPUTATION Right 05/02/2017   Procedure: AMPUTATION TRANSMETARSAL;  Surgeon: [name], MD;  Location: [place];  Service: Orthopedics;  Laterality: Right;   AMPUTATION Right 06/13/2017   Procedure: RIGHT BELOW KNEE AMPUTATION;  Surgeon: [name], MD;  Location: [place];  Service: Orthopedics;  Laterality: Right;   AMPUTATION Left 10/27/2020   Procedure: LEFT BELOW KNEE AMPUTATION;  Surgeon: [name], MD;  Location: [place];  Service: Orthopedics;  Laterality: Left;   AMPUTATION Left 11/17/2020   Procedure: REVISION AMPUTATION BELOW KNEE, LEFT;  Surgeon: [name], MD;  Location: [place];  Service: Orthopedics;  Laterality: Left;   AMPUTATION Left 01/03/2021   Procedure: LEFT ABOVE KNEE AMPUTATION;  Surgeon: [name], MD;  Location: [place];  Service: Orthopedics;  Laterality: Left;   APPLICATION OF WOUND VAC Left 10/27/2020   Procedure: APPLICATION OF WOUND VAC;  Surgeon: [name], MD;  Location: [place];  Service: Orthopedics;  Laterality: Left;   AV FISTULA PLACEMENT Left 03/14/2022   Procedure: LEFT BRACHIOCEPHALIC ARTERIOVENOUS (AV) FISTULA CREATION;  Surgeon: Broadus John, MD;  Location: [place];  Service: Vascular;  Laterality: Left;  PERIPHERAL  NERVE BLOCK   BELOW KNEE LEG AMPUTATION Right 06/13/2017   BELOW KNEE LEG AMPUTATION     CORONARY STENT INTERVENTION N/A 11/10/2018   Procedure: CORONARY STENT INTERVENTION;  Surgeon: Leonie Man, MD;  Location: [place] CV LAB;  Service: Cardiovascular;  Laterality: N/A;   CORONARY/GRAFT ACUTE MI REVASCULARIZATION N/A 03/21/2020   Procedure: Coronary/Graft Acute MI Revascularization;  Surgeon: [name], MD;  Location: [place] CV LAB;  Service: Cardiovascular;  Laterality: N/A;   CYSTOSCOPY N/A 11/09/2020   Procedure: CYSTOSCOPY, EVACUATION OF CLOTS,  FULGERATION OF BLADDER AND BILATERIAL RETROGRADE PYELOGRAMS;  Surgeon: Janith Lima, MD;  Location: [place];  Service: Urology;  Laterality: N/A;   HERNIA REPAIR     I & D EXTREMITY Left 08/21/2014   Procedure: INCISION AND DRAINAGE LEFT SMALL FINGER;  Surgeon: Leanora Cover, MD;  Location: [place];  Service: Orthopedics;  Laterality: Left;   I & D EXTREMITY Right 03/18/2017   Procedure: IRRIGATION AND DEBRIDEMENT EXTREMITY;  Surgeon: [name], MD;  Location: [place];  Service: Orthopedics;  Laterality: Right;   IR FLUORO GUIDE CV LINE RIGHT  11/02/2020   IR FLUORO GUIDE CV LINE RIGHT  03/12/2022   IR REMOVAL TUN CV CATH W/O FL  11/13/2020   IR US GUIDE VASC ACCESS RIGHT  11/02/2020   IR US GUIDE VASC ACCESS RIGHT  03/12/2022   IR VENOCAVAGRAM SVC  03/12/2022   LEFT HEART CATH AND CORONARY ANGIOGRAPHY N/A 11/10/2018   Procedure: LEFT HEART CATH AND CORONARY ANGIOGRAPHY;  Surgeon: Leonie Man, MD;  Location: [place] CV LAB;  Service: Cardiovascular;  Laterality: N/A;   LEFT HEART CATH AND CORONARY ANGIOGRAPHY N/A 03/21/2020   Procedure: LEFT HEART CATH AND CORONARY ANGIOGRAPHY;  Surgeon: [name], MD;  Location: [place] CV LAB;  Service: Cardiovascular;  Laterality: N/A;   MINOR IRRIGATION AND DEBRIDEMENT OF WOUND Right 04/22/2014   Procedure: IRRIGATION AND DEBRIDEMENT OF RIGHT NECK ABCESS;  Surgeon: Jerrell Belfast, MD;  Location: [place];  Service: ENT;  Laterality: Right;   MULTIPLE EXTRACTIONS WITH ALVEOLOPLASTY N/A 01/18/2013   Procedure: MULTIPLE EXTRACION 3, 6, 7, 10, 11, 13, 21, 22, 27, 28, 29, 30 WITH ALVEOLOPLASTY;  Surgeon: Gae Bon, DDS;  Location: [place];  Service: Oral Surgery;  Laterality: N/A;   SKIN SPLIT GRAFT Right 03/21/2017   Procedure: IRRIGATION AND DEBRIDEMENT RIGHT FOOT AND APPLY SPLIT THICKNESS SKIN GRAFT AND WOUND VAC;  Surgeon: [name], MD;  Location: [place];  Service: Orthopedics;  Laterality: Right;   STUMP REVISION Left 12/02/2020   Procedure:  REVISION LEFT BELOW KNEE AMPUTATION;  Surgeon: [name], MD;  Location: [place];  Service: Orthopedics;  Laterality: Left;   TEE WITHOUT CARDIOVERSION N/A 05/02/2017   Procedure: TRANSESOPHAGEAL ECHOCARDIOGRAM (TEE);  Surgeon: Thayer Headings, MD;  Location: [place];  Service: Cardiovascular;  Laterality: N/A;  coincidental to orthopedic case    Family History  Problem Relation Age of Onset   Hypertension Mother    Arthritis Father    Hypertension Father    Hypertension Brother    Cancer Maternal Grandmother 28       unknown type of cancer   Depression Paternal Grandmother    Current Outpatient Medications on File Prior to Visit  Medication Sig Dispense Refill   allopurinol (ZYLOPRIM) 100 MG tablet Take 100 mg by mouth daily.     aspirin EC 81 MG tablet Take 81 mg by mouth daily. Swallow  whole.     atorvastatin (LIPITOR) 80 MG tablet Take 1 tablet (80 mg total) by mouth daily. 30 tablet 1   bictegravir-emtricitabine-tenofovir AF (BIKTARVY) 50-200-25 MG TABS tablet Take 1 tablet by mouth daily with supper. 30 tablet 0   bictegravir-emtricitabine-tenofovir AF (BIKTARVY) 50-200-25 MG TABS tablet Take 1 tablet by mouth daily. Try to take at the same time each day with or without food. 30 tablet 5   calcium acetate (PHOSLO) 667 MG capsule Take 2 capsules (1,334 mg total) by mouth 3 (three) times daily with meals.     clopidogrel (PLAVIX) 75 MG tablet Take 1 tablet (75 mg total) by mouth daily. 90 tablet 3   dapsone 100 MG tablet Take 1 tablet (100 mg total) by mouth daily. 30 tablet 5   escitalopram (LEXAPRO) 20 MG tablet Take 1 tablet (20 mg total) by mouth daily. 30 tablet 3   ferrous sulfate 325 (65 FE) MG EC tablet Take 325 mg by mouth daily with breakfast.     finasteride (PROSCAR) 5 MG tablet Take 5 mg by mouth daily.     insulin aspart (NOVOLOG) 100 UNIT/ML injection Inject 0-9 Units into the skin 3 (three) times daily with meals.     insulin glargine (LANTUS) 100 UNIT/ML Solostar Pen  Inject 10 Units into the skin daily. 1 mL    Lancets (ONETOUCH ULTRASOFT) lancets Use as instructed 100 each 12   oxyCODONE-acetaminophen (PERCOCET/ROXICET) 5-325 MG tablet Take 1 tablet by mouth every 8 (eight) hours as needed for severe pain. 9 tablet 0   tamsulosin (FLOMAX) 0.4 MG CAPS capsule Take 1 capsule (0.4 mg total) by mouth daily after supper.     No current facility-administered medications on file prior to visit.    Allergies  Allergen Reactions   Elemental Sulfur Itching    Patient stated he's allergic to "sulfur" AND "sulfa"   Metformin And Related Other (See Comments)    Stopped by MD- affected kidneys   Zetia [Ezetimibe] Nausea Only   Sulfa Antibiotics Itching      Lab Results HIV 1 RNA Quant  Date Value  04/15/2022 Not Detected Copies/mL  03/10/2022 8,540 copies/mL  08/17/2021 <20 DETECTED copies/mL (A)   CD4 T Cell Abs (/uL)  Date Value  02/13/2021 166 (L)  12/12/2020 103 (L)  09/01/2019 89 (L)   Lab Results  Component Value Date   HIV1GENOSEQ REPORT 07/06/2014   Lab Results  Component Value Date   WBC 9.9 04/15/2022   HGB 10.1 (L) 04/15/2022   HCT 31.3 (L) 04/15/2022   MCV 85.1 04/15/2022   PLT 255 04/15/2022    Lab Results  Component Value Date   CREATININE 4.47 (H) 04/15/2022   BUN 60 (H) 04/15/2022   NA 134 (L) 04/15/2022   K 5.4 (H) 04/15/2022   CL 98 04/15/2022   CO2 24 04/15/2022   Lab Results  Component Value Date   ALT 8 (L) 04/15/2022   AST 11 04/15/2022   ALKPHOS 65 03/09/2022   BILITOT 0.3 04/15/2022    Lab Results  Component Value Date   CHOL 175 08/17/2021   TRIG 134 08/17/2021   HDL 45 08/17/2021   LDLCALC 105 (H) 08/17/2021   Lab Results  Component Value Date   HAV REACTIVE (A) 04/15/2022   Lab Results  Component Value Date   HEPBSAG NON REACTIVE 03/11/2022   HEPBSAB NEG 11/20/2012   Lab Results  Component Value Date   HCVAB NON REACTIVE 11/02/2020   Lab Results  Component Value Date   CHLAMYDIAWP  Negative 11/18/2016   N Negative 11/18/2016   No results found for: "GCPROBEAPT" No results found for: "[place]"  Assessment/Plan #HIV  #HIV  #ESRD on HD -VL 8540 on 03/10/22->ND on 1/8, cd4 89(9% on 08/17/21)-> -d/c Dovato, Pifeltro  on 1/8 and started biktarvy(on and off biktarvy over the last couple 'years). Pt on iHD through right subclavian x1 month -Continue dapsone -Requested immunization from facily(flu/HBV) -f/u in one month     #Vaccination COVID Flu not 2023 at SNF Monkeypox PCV 13 10/15/18, PCV 23 03/16/22 Meningitis x1 05/13/16 HepA innune 04/15/22 HEpB sAg nr/sAB NR on 03/11/22, cab nr on 10/2820(needs 3rd dose->07/06/2014, 06/02/13 Tdap 05/30/2014 Shingles   #Health maintenance -Quantiferon today 04/15/22 -RPR NT 08/21/21 -HCV 11/02/20 NR -Colonoscopy discuss at next visit   Laurice Record, [place] for [place]

## 2022-06-05 ENCOUNTER — Other Ambulatory Visit: Payer: Self-pay | Admitting: Internal Medicine

## 2022-07-20 ENCOUNTER — Other Ambulatory Visit: Payer: Self-pay | Admitting: Vascular Surgery

## 2022-07-20 DIAGNOSIS — N186 End stage renal disease: Secondary | ICD-10-CM

## 2022-07-30 ENCOUNTER — Other Ambulatory Visit: Payer: Self-pay | Admitting: Internal Medicine

## 2022-08-07 IMAGING — CT CT HEAD W/O CM
2 series · 15 of 30 positions shown, 17 images · non-contrast
Comparison: CT brain 08/22/2021, MRI 04/25/2020

CLINICAL DATA: Head trauma



[Series 3: head wo · axial · 0.46mm/px · z∈[+1199,+1339]mm · 7 of 38 slices shown, 9 images]
[im 5/38  brain]
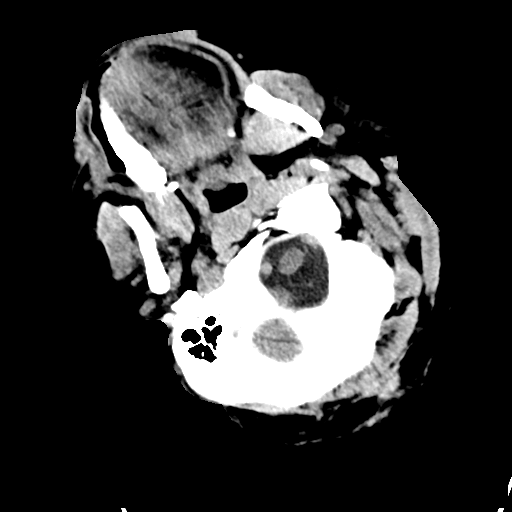
[im 5/38  bone]
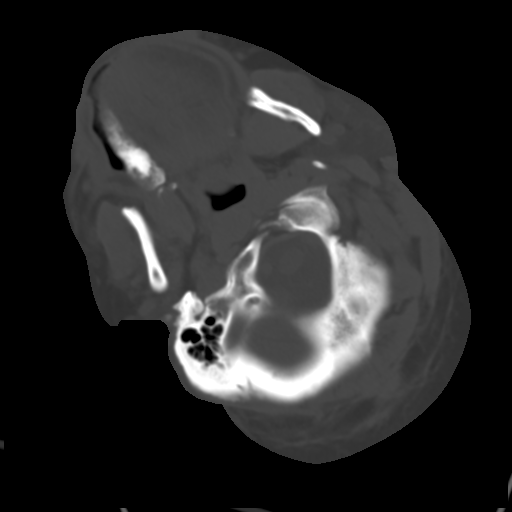
[im 10/38  brain]
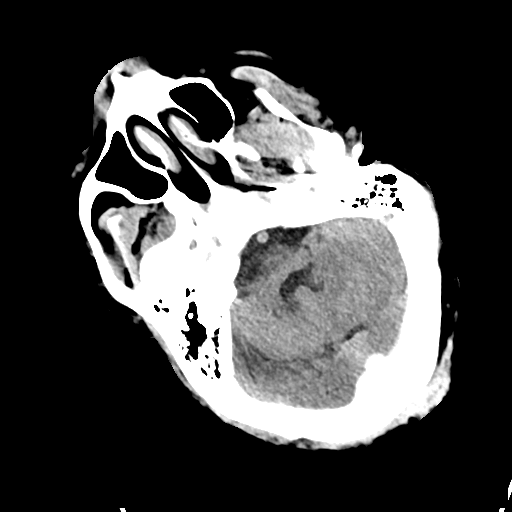
[im 14/38  brain]
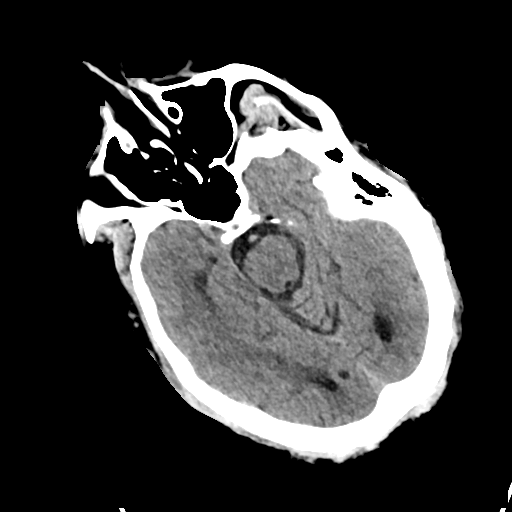
[im 19/38  brain]
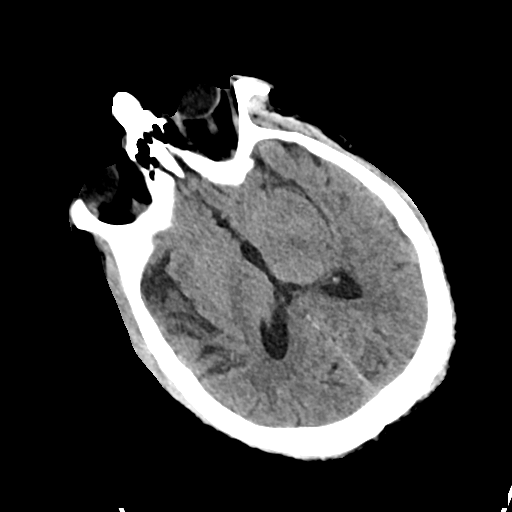
[im 24/38  brain]
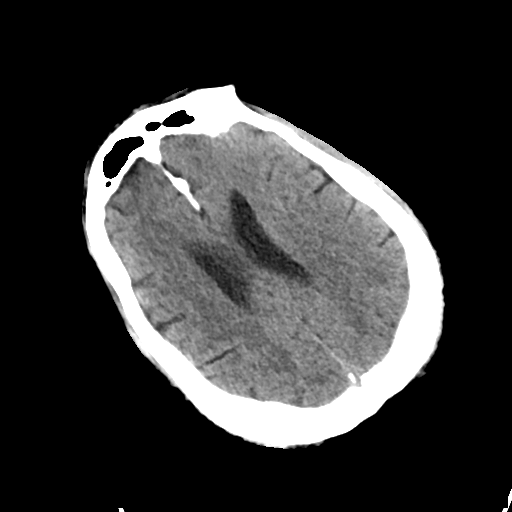
[im 24/38  bone]
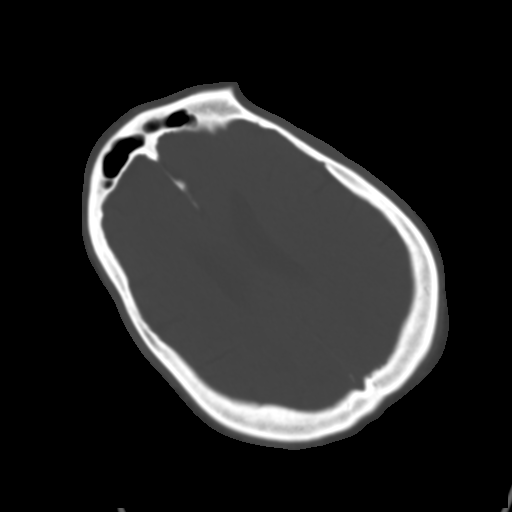
[im 28/38  brain]
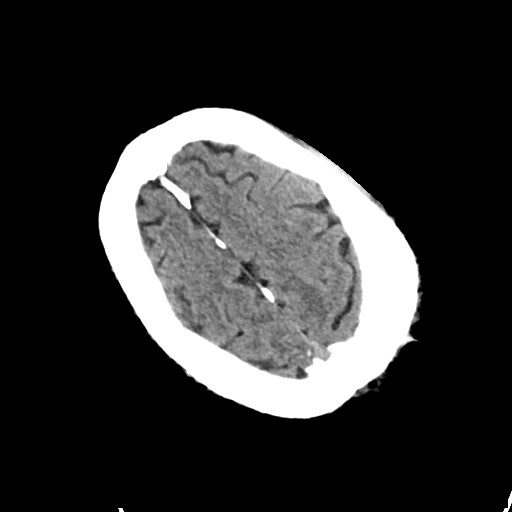
[im 33/38  brain]
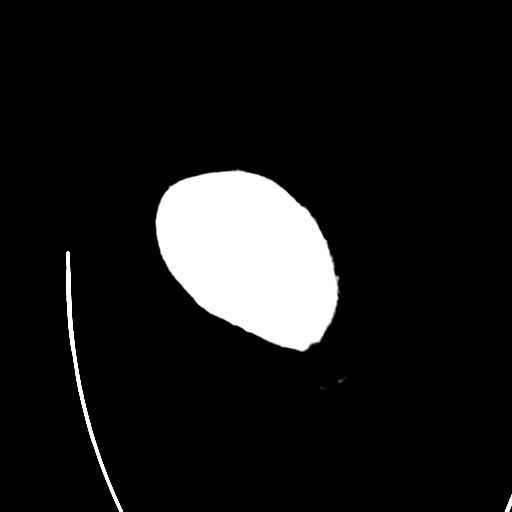

[Series 4: head bone · axial · 0.46mm/px · z∈[+1197,+1347]mm · 8 of 95 slices shown]
[im 10/95  bone]
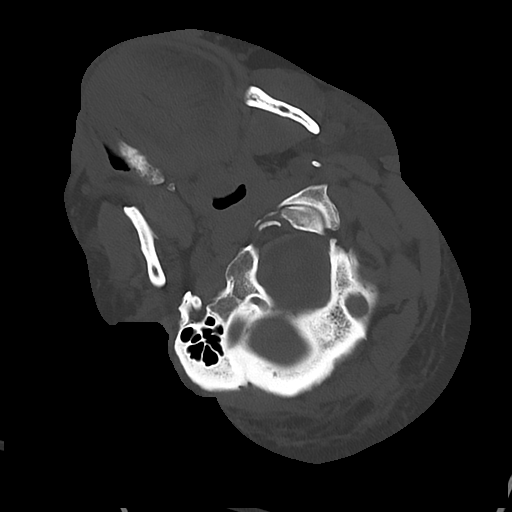
[im 19/95  bone]
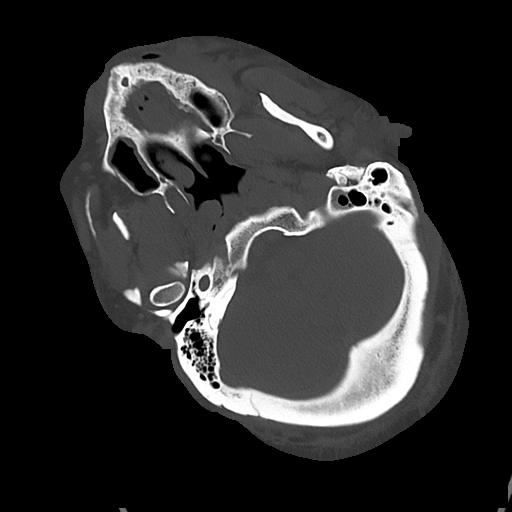
[im 29/95  bone]
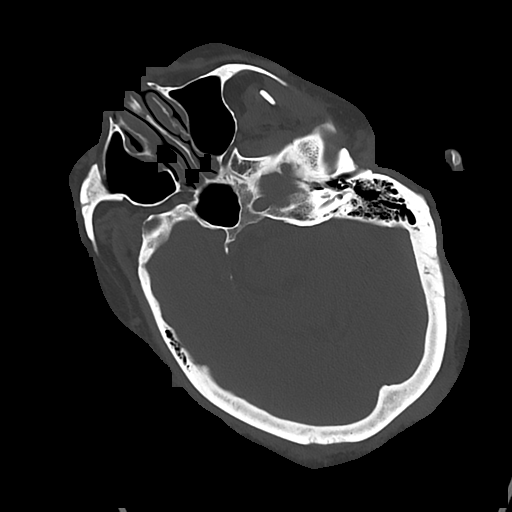
[im 43/95  bone]
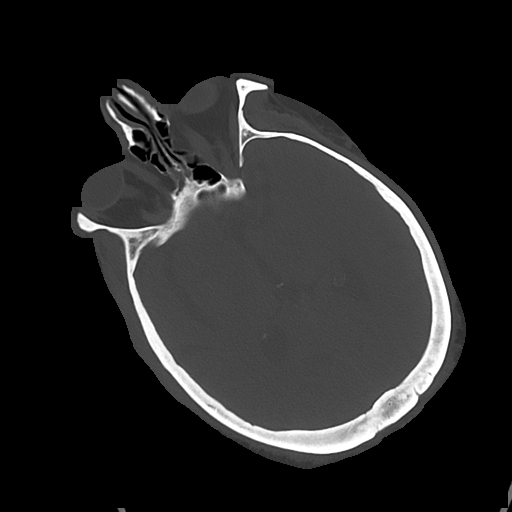
[im 52/95  bone]
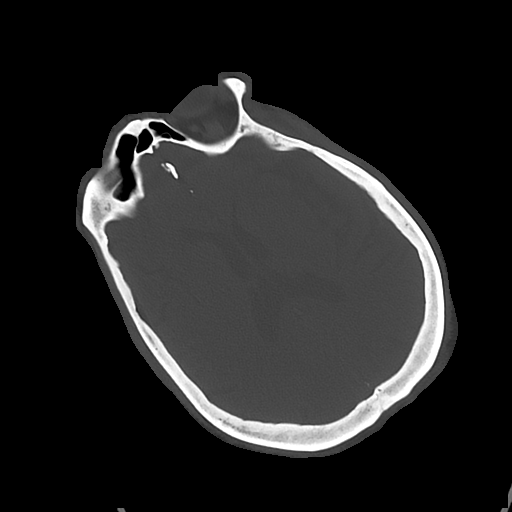
[im 66/95  bone]
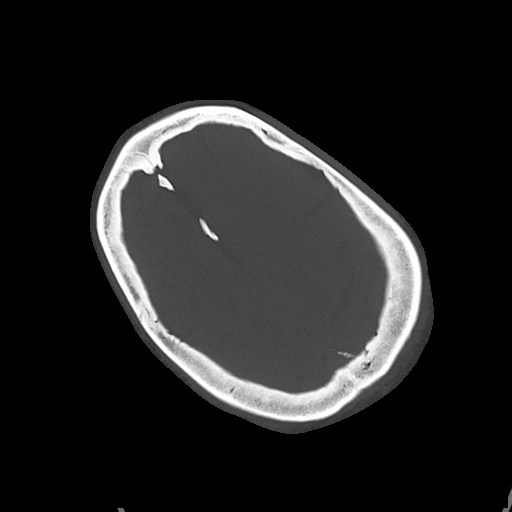
[im 76/95  bone]
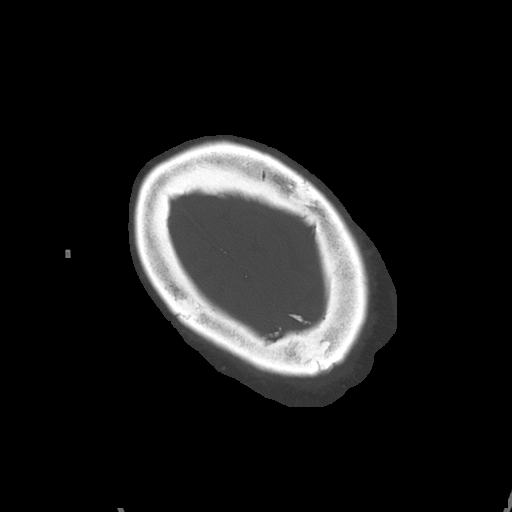
[im 85/95  bone]
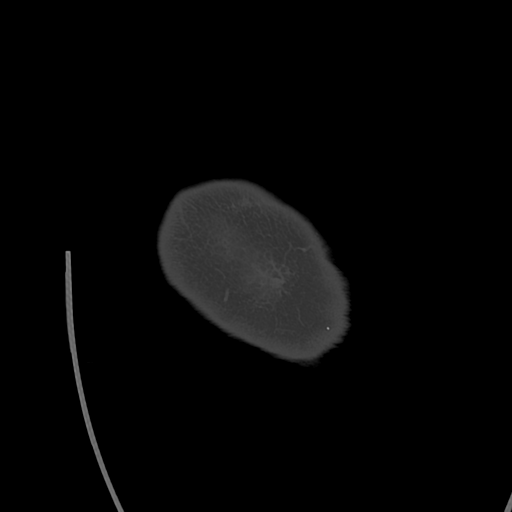

[15 of 30 positions shown; findings below may reference images not displayed]

FINDINGS: Brain: No acute territorial infarction, hemorrhage or intracranial
mass. Stable ventricle size. Small chronic infarct in the right
pons.

Vascular: No hyperdense vessels.  No unexpected calcification

Skull: Normal. Negative for fracture or focal lesion.

Sinuses/Orbits: Mucosal thickening in the sinuses. Old appearing
fracture deformities of the medial walls of the orbits.

Other: None
IMPRESSION: No CT evidence for acute intracranial abnormality

## 2022-09-02 ENCOUNTER — Other Ambulatory Visit: Payer: Self-pay

## 2022-09-02 ENCOUNTER — Inpatient Hospital Stay (HOSPITAL_COMMUNITY)
Admission: EM | Admit: 2022-09-02 | Discharge: 2022-09-05 | DRG: 314 | Disposition: A | Discharge status: Skilled Nursing Facility | Payer: 59 | Attending: Internal Medicine | Admitting: Internal Medicine

## 2022-09-02 ENCOUNTER — Encounter (HOSPITAL_COMMUNITY): Payer: Self-pay

## 2022-09-02 DIAGNOSIS — I120 Hypertensive chronic kidney disease with stage 5 chronic kidney disease or end stage renal disease: Secondary | ICD-10-CM | POA: Diagnosis present

## 2022-09-02 DIAGNOSIS — N186 End stage renal disease: Secondary | ICD-10-CM | POA: Diagnosis present

## 2022-09-02 DIAGNOSIS — M109 Gout, unspecified: Secondary | ICD-10-CM | POA: Diagnosis present

## 2022-09-02 DIAGNOSIS — Z8673 Personal history of transient ischemic attack (TIA), and cerebral infarction without residual deficits: Secondary | ICD-10-CM

## 2022-09-02 DIAGNOSIS — E1151 Type 2 diabetes mellitus with diabetic peripheral angiopathy without gangrene: Secondary | ICD-10-CM | POA: Diagnosis present

## 2022-09-02 DIAGNOSIS — E875 Hyperkalemia: Secondary | ICD-10-CM | POA: Diagnosis not present

## 2022-09-02 DIAGNOSIS — B20 Human immunodeficiency virus [HIV] disease: Secondary | ICD-10-CM | POA: Diagnosis present

## 2022-09-02 DIAGNOSIS — E785 Hyperlipidemia, unspecified: Secondary | ICD-10-CM | POA: Diagnosis present

## 2022-09-02 DIAGNOSIS — M898X9 Other specified disorders of bone, unspecified site: Secondary | ICD-10-CM | POA: Diagnosis present

## 2022-09-02 DIAGNOSIS — G8929 Other chronic pain: Secondary | ICD-10-CM | POA: Diagnosis present

## 2022-09-02 DIAGNOSIS — Z888 Allergy status to other drugs, medicaments and biological substances status: Secondary | ICD-10-CM

## 2022-09-02 DIAGNOSIS — I252 Old myocardial infarction: Secondary | ICD-10-CM

## 2022-09-02 DIAGNOSIS — Z79899 Other long term (current) drug therapy: Secondary | ICD-10-CM

## 2022-09-02 DIAGNOSIS — T829XXA Unspecified complication of cardiac and vascular prosthetic device, implant and graft, initial encounter: Secondary | ICD-10-CM

## 2022-09-02 DIAGNOSIS — Z89512 Acquired absence of left leg below knee: Secondary | ICD-10-CM

## 2022-09-02 DIAGNOSIS — Z794 Long term (current) use of insulin: Secondary | ICD-10-CM

## 2022-09-02 DIAGNOSIS — D631 Anemia in chronic kidney disease: Secondary | ICD-10-CM | POA: Diagnosis present

## 2022-09-02 DIAGNOSIS — T82590A Other mechanical complication of surgically created arteriovenous fistula, initial encounter: Secondary | ICD-10-CM | POA: Diagnosis not present

## 2022-09-02 DIAGNOSIS — I10 Essential (primary) hypertension: Secondary | ICD-10-CM | POA: Diagnosis present

## 2022-09-02 DIAGNOSIS — I739 Peripheral vascular disease, unspecified: Secondary | ICD-10-CM | POA: Diagnosis present

## 2022-09-02 DIAGNOSIS — E1100 Type 2 diabetes mellitus with hyperosmolarity without nonketotic hyperglycemic-hyperosmolar coma (NKHHC): Principal | ICD-10-CM

## 2022-09-02 DIAGNOSIS — I2510 Atherosclerotic heart disease of native coronary artery without angina pectoris: Secondary | ICD-10-CM | POA: Diagnosis present

## 2022-09-02 DIAGNOSIS — Z992 Dependence on renal dialysis: Secondary | ICD-10-CM

## 2022-09-02 DIAGNOSIS — Z882 Allergy status to sulfonamides status: Secondary | ICD-10-CM

## 2022-09-02 DIAGNOSIS — N400 Enlarged prostate without lower urinary tract symptoms: Secondary | ICD-10-CM | POA: Diagnosis present

## 2022-09-02 DIAGNOSIS — Z7982 Long term (current) use of aspirin: Secondary | ICD-10-CM

## 2022-09-02 DIAGNOSIS — E669 Obesity, unspecified: Secondary | ICD-10-CM | POA: Diagnosis present

## 2022-09-02 DIAGNOSIS — E119 Type 2 diabetes mellitus without complications: Secondary | ICD-10-CM

## 2022-09-02 DIAGNOSIS — Z8249 Family history of ischemic heart disease and other diseases of the circulatory system: Secondary | ICD-10-CM

## 2022-09-02 DIAGNOSIS — Y712 Prosthetic and other implants, materials and accessory cardiovascular devices associated with adverse incidents: Secondary | ICD-10-CM | POA: Diagnosis present

## 2022-09-02 DIAGNOSIS — K219 Gastro-esophageal reflux disease without esophagitis: Secondary | ICD-10-CM | POA: Diagnosis present

## 2022-09-02 DIAGNOSIS — T82510A Breakdown (mechanical) of surgically created arteriovenous fistula, initial encounter: Principal | ICD-10-CM | POA: Diagnosis present

## 2022-09-02 DIAGNOSIS — E1165 Type 2 diabetes mellitus with hyperglycemia: Secondary | ICD-10-CM | POA: Diagnosis present

## 2022-09-02 DIAGNOSIS — Z89612 Acquired absence of left leg above knee: Secondary | ICD-10-CM

## 2022-09-02 DIAGNOSIS — E1122 Type 2 diabetes mellitus with diabetic chronic kidney disease: Secondary | ICD-10-CM | POA: Diagnosis present

## 2022-09-02 DIAGNOSIS — Z89511 Acquired absence of right leg below knee: Secondary | ICD-10-CM

## 2022-09-02 DIAGNOSIS — Z6836 Body mass index (BMI) 36.0-36.9, adult: Secondary | ICD-10-CM

## 2022-09-02 DIAGNOSIS — Z7902 Long term (current) use of antithrombotics/antiplatelets: Secondary | ICD-10-CM

## 2022-09-02 DIAGNOSIS — Z955 Presence of coronary angioplasty implant and graft: Secondary | ICD-10-CM

## 2022-09-02 DIAGNOSIS — F32A Depression, unspecified: Secondary | ICD-10-CM | POA: Diagnosis present

## 2022-09-02 LAB — COMPREHENSIVE METABOLIC PANEL
ALT: 34 U/L (ref 0–44)
AST: 33 U/L (ref 15–41)
Albumin: 3.9 g/dL (ref 3.5–5.0)
Alkaline Phosphatase: 72 U/L (ref 38–126)
Anion gap: 11 (ref 5–15)
BUN: 63 mg/dL — ABNORMAL HIGH (ref 6–20)
CO2: 23 mmol/L (ref 22–32)
Calcium: 9.1 mg/dL (ref 8.9–10.3)
Chloride: 104 mmol/L (ref 98–111)
Creatinine, Ser: 8.07 mg/dL — ABNORMAL HIGH (ref 0.61–1.24)
GFR, Estimated: 7 mL/min — ABNORMAL LOW (ref >60)
Glucose, Bld: 157 mg/dL — ABNORMAL HIGH (ref 70–99)
Potassium: 6.1 mmol/L — ABNORMAL HIGH (ref 3.5–5.1)
Sodium: 138 mmol/L (ref 135–145)
Total Bilirubin: 0.9 mg/dL (ref 0.3–1.2)
Total Protein: 7.6 g/dL (ref 6.5–8.1)

## 2022-09-02 LAB — CBC WITH DIFFERENTIAL/PLATELET
Abs Immature Granulocytes: 0.02 10*3/uL (ref 0.00–0.07)
Basophils Absolute: 0.1 10*3/uL (ref 0.0–0.1)
Basophils Relative: 1 %
Eosinophils Absolute: 0.2 10*3/uL (ref 0.0–0.5)
Eosinophils Relative: 3 %
HCT: 32 % — ABNORMAL LOW (ref 39.0–52.0)
Hemoglobin: 9.8 g/dL — ABNORMAL LOW (ref 13.0–17.0)
Immature Granulocytes: 0 %
Lymphocytes Relative: 22 %
Lymphs Abs: 1.6 10*3/uL (ref 0.7–4.0)
MCH: 31.4 pg (ref 26.0–34.0)
MCHC: 30.6 g/dL (ref 30.0–36.0)
MCV: 102.6 fL — ABNORMAL HIGH (ref 80.0–100.0)
Monocytes Absolute: 0.4 10*3/uL (ref 0.1–1.0)
Monocytes Relative: 6 %
Neutro Abs: 4.8 10*3/uL (ref 1.7–7.7)
Neutrophils Relative %: 68 %
Platelets: 204 10*3/uL (ref 150–400)
RBC: 3.12 MIL/uL — ABNORMAL LOW (ref 4.22–5.81)
RDW: 16 % — ABNORMAL HIGH (ref 11.5–15.5)
WBC: 7.1 10*3/uL (ref 4.0–10.5)
nRBC: 0 % (ref 0.0–0.2)

## 2022-09-02 LAB — GLUCOSE, CAPILLARY
Glucose-Capillary: 83 mg/dL (ref 70–99)
Glucose-Capillary: 123 mg/dL — ABNORMAL HIGH (ref 70–99)

## 2022-09-02 LAB — HEPATITIS B SURFACE ANTIGEN: Hepatitis B Surface Ag: NONREACTIVE

## 2022-09-02 MED ORDER — SODIUM BICARBONATE 8.4 % IV SOLN
50.0000 meq | Freq: Once | INTRAVENOUS | Status: AC
  Administered 2022-09-02: 50 meq via INTRAVENOUS
  Filled 2022-09-02: qty 50

## 2022-09-02 MED ORDER — DOCUSATE SODIUM 283 MG RE ENEM: 1.0000 | ENEMA | RECTAL | Status: DC | PRN

## 2022-09-02 MED ORDER — DULOXETINE HCL 60 MG PO CPEP
60.0000 mg | ORAL_CAPSULE | Freq: Every evening | ORAL | Status: DC
  Administered 2022-09-02 – 2022-09-04 (×3): 60 mg via ORAL
  Filled 2022-09-02 (×3): qty 1

## 2022-09-02 MED ORDER — CALCIUM GLUCONATE 10 % IV SOLN
1.0000 g | Freq: Once | INTRAVENOUS | Status: AC
  Administered 2022-09-02: 1 g via INTRAVENOUS
  Filled 2022-09-02: qty 10

## 2022-09-02 MED ORDER — ONDANSETRON HCL 4 MG/2ML IJ SOLN: 4.0000 mg | Freq: Four times a day (QID) | INTRAMUSCULAR | Status: DC | PRN

## 2022-09-02 MED ORDER — CALCIUM ACETATE (PHOS BINDER) 667 MG PO CAPS
1334.0000 mg | ORAL_CAPSULE | Freq: Three times a day (TID) | ORAL | Status: DC
  Administered 2022-09-02 – 2022-09-05 (×4): 1334 mg via ORAL
  Filled 2022-09-02 (×4): qty 2

## 2022-09-02 MED ORDER — ALLOPURINOL 100 MG PO TABS
100.0000 mg | ORAL_TABLET | Freq: Every day | ORAL | Status: DC
  Administered 2022-09-03 – 2022-09-05 (×2): 100 mg via ORAL
  Filled 2022-09-02 (×2): qty 1

## 2022-09-02 MED ORDER — BICTEGRAVIR-EMTRICITAB-TENOFOV 50-200-25 MG PO TABS
1.0000 | ORAL_TABLET | Freq: Every day | ORAL | Status: DC
  Administered 2022-09-02 – 2022-09-03 (×2): 1 via ORAL
  Filled 2022-09-02 (×5): qty 1

## 2022-09-02 MED ORDER — CAMPHOR-MENTHOL 0.5-0.5 % EX LOTN: 1.0000 | TOPICAL_LOTION | Freq: Three times a day (TID) | CUTANEOUS | Status: DC | PRN

## 2022-09-02 MED ORDER — CHLORHEXIDINE GLUCONATE CLOTH 2 % EX PADS
6.0000 | MEDICATED_PAD | Freq: Every day | CUTANEOUS | Status: DC
  Administered 2022-09-05: 6 via TOPICAL

## 2022-09-02 MED ORDER — ORAL CARE MOUTH RINSE: 15.0000 mL | OROMUCOSAL | Status: DC | PRN

## 2022-09-02 MED ORDER — INSULIN GLARGINE-YFGN 100 UNIT/ML ~~LOC~~ SOLN
5.0000 [IU] | Freq: Every evening | SUBCUTANEOUS | Status: DC
  Administered 2022-09-02 – 2022-09-04 (×3): 5 [IU] via SUBCUTANEOUS
  Filled 2022-09-02 (×4): qty 0.05

## 2022-09-02 MED ORDER — ZOLPIDEM TARTRATE 5 MG PO TABS
5.0000 mg | ORAL_TABLET | Freq: Every evening | ORAL | Status: DC | PRN
  Administered 2022-09-03 – 2022-09-04 (×2): 5 mg via ORAL
  Filled 2022-09-02 (×2): qty 1

## 2022-09-02 MED ORDER — TAMSULOSIN HCL 0.4 MG PO CAPS
0.4000 mg | ORAL_CAPSULE | Freq: Every day | ORAL | Status: DC
  Administered 2022-09-03 – 2022-09-05 (×2): 0.4 mg via ORAL
  Filled 2022-09-02 (×2): qty 1

## 2022-09-02 MED ORDER — CALCITRIOL 0.5 MCG PO CAPS
1.0000 ug | ORAL_CAPSULE | ORAL | Status: DC
  Administered 2022-09-03: 1 ug via ORAL
  Filled 2022-09-02: qty 2

## 2022-09-02 MED ORDER — HYDROXYZINE HCL 25 MG PO TABS
25.0000 mg | ORAL_TABLET | Freq: Three times a day (TID) | ORAL | Status: DC | PRN
  Administered 2022-09-03 – 2022-09-04 (×2): 25 mg via ORAL
  Filled 2022-09-02 (×3): qty 1

## 2022-09-02 MED ORDER — CALCIUM CARBONATE ANTACID 1250 MG/5ML PO SUSP: 500.0000 mg | Freq: Four times a day (QID) | ORAL | Status: DC | PRN

## 2022-09-02 MED ORDER — ATORVASTATIN CALCIUM 80 MG PO TABS
80.0000 mg | ORAL_TABLET | Freq: Every day | ORAL | Status: DC
  Administered 2022-09-03 – 2022-09-05 (×2): 80 mg via ORAL
  Filled 2022-09-02 (×2): qty 1

## 2022-09-02 MED ORDER — ACETAMINOPHEN 650 MG RE SUPP: 650.0000 mg | Freq: Four times a day (QID) | RECTAL | Status: DC | PRN

## 2022-09-02 MED ORDER — DAPSONE 100 MG PO TABS
100.0000 mg | ORAL_TABLET | Freq: Every day | ORAL | Status: DC
  Administered 2022-09-03: 100 mg via ORAL
  Filled 2022-09-02 (×3): qty 1

## 2022-09-02 MED ORDER — SODIUM ZIRCONIUM CYCLOSILICATE 10 G PO PACK
10.0000 g | PACK | Freq: Once | ORAL | Status: AC
  Administered 2022-09-02: 10 g via ORAL
  Filled 2022-09-02: qty 1

## 2022-09-02 MED ORDER — ASPIRIN 81 MG PO TBEC
81.0000 mg | DELAYED_RELEASE_TABLET | Freq: Every day | ORAL | Status: DC
  Administered 2022-09-03 – 2022-09-05 (×2): 81 mg via ORAL
  Filled 2022-09-02 (×2): qty 1

## 2022-09-02 MED ORDER — DARBEPOETIN ALFA 60 MCG/0.3ML IJ SOSY
60.0000 ug | PREFILLED_SYRINGE | INTRAMUSCULAR | Status: DC
  Filled 2022-09-02 (×2): qty 0.3

## 2022-09-02 MED ORDER — NEPRO/CARBSTEADY PO LIQD
237.0000 mL | Freq: Three times a day (TID) | ORAL | Status: DC | PRN
  Filled 2022-09-02: qty 237

## 2022-09-02 MED ORDER — OXYCODONE HCL 5 MG PO TABS
10.0000 mg | ORAL_TABLET | ORAL | Status: DC | PRN
  Administered 2022-09-04: 10 mg via ORAL
  Filled 2022-09-02 (×2): qty 2

## 2022-09-02 MED ORDER — HYDRALAZINE HCL 20 MG/ML IJ SOLN: 5.0000 mg | INTRAMUSCULAR | Status: DC | PRN

## 2022-09-02 MED ORDER — SODIUM CHLORIDE 0.9% FLUSH
3.0000 mL | Freq: Two times a day (BID) | INTRAVENOUS | Status: DC
  Administered 2022-09-02 – 2022-09-04 (×3): 3 mL via INTRAVENOUS

## 2022-09-02 MED ORDER — SORBITOL 70 % SOLN: 30.0000 mL | Status: DC | PRN

## 2022-09-02 MED ORDER — ONDANSETRON HCL 4 MG PO TABS: 4.0000 mg | ORAL_TABLET | Freq: Four times a day (QID) | ORAL | Status: DC | PRN

## 2022-09-02 MED ORDER — CLOPIDOGREL BISULFATE 75 MG PO TABS
75.0000 mg | ORAL_TABLET | Freq: Every day | ORAL | Status: DC
  Administered 2022-09-03 – 2022-09-05 (×2): 75 mg via ORAL
  Filled 2022-09-02 (×2): qty 1

## 2022-09-02 MED ORDER — FUROSEMIDE 10 MG/ML IJ SOLN
40.0000 mg | Freq: Once | INTRAMUSCULAR | Status: AC
  Administered 2022-09-02: 40 mg via INTRAVENOUS
  Filled 2022-09-02: qty 4

## 2022-09-02 MED ORDER — ACETAMINOPHEN 325 MG PO TABS: 650.0000 mg | ORAL_TABLET | Freq: Four times a day (QID) | ORAL | Status: DC | PRN

## 2022-09-02 MED ORDER — FINASTERIDE 5 MG PO TABS
5.0000 mg | ORAL_TABLET | Freq: Every evening | ORAL | Status: DC
  Administered 2022-09-02 – 2022-09-04 (×3): 5 mg via ORAL
  Filled 2022-09-02 (×3): qty 1

## 2022-09-02 MED ORDER — HEPARIN SODIUM (PORCINE) 5000 UNIT/ML IJ SOLN
5000.0000 [IU] | Freq: Three times a day (TID) | INTRAMUSCULAR | Status: DC
  Administered 2022-09-02 – 2022-09-05 (×7): 5000 [IU] via SUBCUTANEOUS
  Filled 2022-09-02 (×8): qty 1

## 2022-09-02 MED ORDER — ALBUTEROL SULFATE (2.5 MG/3ML) 0.083% IN NEBU: 3.0000 mL | INHALATION_SOLUTION | Freq: Four times a day (QID) | RESPIRATORY_TRACT | Status: DC | PRN

## 2022-09-02 MED ORDER — SODIUM ZIRCONIUM CYCLOSILICATE 10 G PO PACK
10.0000 g | PACK | Freq: Two times a day (BID) | ORAL | Status: DC
  Administered 2022-09-02: 10 g via ORAL
  Filled 2022-09-02 (×2): qty 1

## 2022-09-02 MED ORDER — INSULIN ASPART 100 UNIT/ML IJ SOLN: 0.0000 [IU] | Freq: Three times a day (TID) | INTRAMUSCULAR | Status: DC

## 2022-09-02 NOTE — ED Triage Notes (Addendum)
Reports went to dialysis today and was told his fistula is not working. Swelling noted but +bruit  Reports went Saturday and was told it was too swollen to do and to come back Monday and went today and they reported it keeps clogging.

## 2022-09-02 NOTE — Consult Note (Addendum)
Renal Service Consult Note [facility]  TALAN GRUSS 09/02/2022 Maree Krabbe, MD Requesting Physician: Dr. Suezanne Jacquet, Lacretia Nicks.   Reason for Consult: ESRD pt w/ malfunctioning AVF and hyperkalemia HPI: The patient is a 57 y.o. year-old w/ PMH as below who presented to ED sent from HD where they said his fistula was not working and also some swelling noted. Last HD was 5/23 Thursday last week. On Sat did not get HD and today they tried to stick him and were unsuccessful. In ED K+ 6.1, min EKG changes and other labs are unremarkable. IR was consulted for access malfunction. We are asked to see for esrd.   Pt seen in ED, pt started HD in Dec 2023, this is his 1st fistula. He states they tried to stick him Sat w/o success. There was some swelling and he was told to come back today. Today they were able to get the more distal needle in but the upper needle was "pulling clots".  He denies any SOB, CP or abd pain.   ROS - denies CP, no joint pain, no HA, no blurry vision, no rash, no diarrhea, no nausea/ vomiting, no dysuria, no difficulty voiding   Past Medical History  Past Medical History:  Diagnosis Date   Abscess of right foot    abscess/ulcer of R transtibial amputation requiring IV abx   Acute ST elevation myocardial infarction (STEMI) due to occlusion of circumflex coronary artery (HCC) 03/22/2020   AIDS (HCC)    Anemia    CAD (coronary artery disease)    a. MI with stenting of OM1 in 11/2018 with residual disease. b. acute STEMI 03/2020 s/p DES to OM1   Chronic anemia    Chronic knee pain    right   Chronic pain    CKD (chronic kidney disease), stage IV (HCC)    CVA (cerebral vascular accident) (HCC) 04/2020   Diabetes type 2, uncontrolled    HgA1c 17.6 (04/27/2010)   Diabetic foot ulcer (HCC) 01/2017   right foot   Dilatation of aorta (HCC)    Erectile dysfunction    Genital warts    GERD (gastroesophageal reflux disease)    History of blood transfusion    HIV  (human immunodeficiency virus infection) (HCC) 2009   CD4 count 100, VL 13800 (05/01/2010)   Hyperlipidemia    Hypertension    Myocardial infarction [facility])    Neuropathy    Noncompliance with medication regimen    Osteomyelitis (HCC)    h/o hand   Osteomyelitis of metatarsal (HCC) 04/28/2017   Pneumonia    STEMI (ST elevation myocardial infarction) (HCC) 11/10/2018   Past Surgical History  Past Surgical History:  Procedure Laterality Date   AMPUTATION Right 05/02/2017   Procedure: AMPUTATION TRANSMETARSAL;  Surgeon: Nadara Mustard, MD;  Location[facility] OR;  Service: Orthopedics;  Laterality: Right;   AMPUTATION Right 06/13/2017   Procedure: RIGHT BELOW KNEE AMPUTATION;  Surgeon: Nadara Mustard, MD;  Location: [facility] OR;  Service: Orthopedics;  Laterality: Right;   AMPUTATION Left 10/27/2020   Procedure: LEFT BELOW KNEE AMPUTATION;  Surgeon: Nadara Mustard, MD;  Location: [facility] OR;  Service: Orthopedics;  Laterality: Left;   AMPUTATION Left 11/17/2020   Procedure: REVISION AMPUTATION BELOW KNEE, LEFT;  Surgeon: Nadara Mustard, MD;  Location: [facility] OR;  Service: Orthopedics;  Laterality: Left;   AMPUTATION Left 01/03/2021   Procedure: LEFT ABOVE KNEE AMPUTATION;  Surgeon: Nadara Mustard, MD;  Location: [facility] OR;  Service: Orthopedics;  Laterality: Left;   APPLICATION OF WOUND VAC Left 10/27/2020   Procedure: APPLICATION OF WOUND VAC;  Surgeon: Nadara Mustard, MD;  Location: [place] OR;  Service: Orthopedics;  Laterality: Left;   AV FISTULA PLACEMENT Left 03/14/2022   Procedure: LEFT BRACHIOCEPHALIC ARTERIOVENOUS (AV) FISTULA CREATION;  Surgeon: Victorino Sparrow, MD;  Location: [facility] OR;  Service: Vascular;  Laterality: Left;  PERIPHERAL NERVE BLOCK   BELOW KNEE LEG AMPUTATION Right 06/13/2017   BELOW KNEE LEG AMPUTATION     CORONARY STENT INTERVENTION N/A 11/10/2018   Procedure: CORONARY STENT INTERVENTION;  Surgeon: Marykay Lex, MD;  Location: [facility] INVASIVE CV LAB;  Service: Cardiovascular;  Laterality: N/A;    CORONARY/GRAFT ACUTE MI REVASCULARIZATION N/A 03/21/2020   Procedure: Coronary/Graft Acute MI Revascularization;  Surgeon: Runell Gess, MD;  Location: [facility] INVASIVE CV LAB;  Service: Cardiovascular;  Laterality: N/A;   CYSTOSCOPY N/A 11/09/2020   Procedure: CYSTOSCOPY, EVACUATION OF CLOTS, FULGERATION OF BLADDER AND BILATERIAL RETROGRADE PYELOGRAMS;  Surgeon: Jannifer Hick, MD;  Location: [facility] OR;  Service: Urology;  Laterality: N/A;   HERNIA REPAIR     I & D EXTREMITY Left 08/21/2014   Procedure: INCISION AND DRAINAGE LEFT SMALL FINGER;  Surgeon: Betha Loa, MD;  Location: [facility] OR;  Service: Orthopedics;  Laterality: Left;   I & D EXTREMITY Right 03/18/2017   Procedure: IRRIGATION AND DEBRIDEMENT EXTREMITY;  Surgeon: Nadara Mustard, MD;  Location: [facility] OR;  Service: Orthopedics;  Laterality: Right;   IR FLUORO GUIDE CV LINE RIGHT  11/02/2020   IR FLUORO GUIDE CV LINE RIGHT  03/12/2022   IR REMOVAL TUN CV CATH W/O FL  11/13/2020   IR US GUIDE VASC ACCESS RIGHT  11/02/2020   IR US GUIDE VASC ACCESS RIGHT  03/12/2022   IR VENOCAVAGRAM SVC  03/12/2022   LEFT HEART CATH AND CORONARY ANGIOGRAPHY N/A 11/10/2018   Procedure: LEFT HEART CATH AND CORONARY ANGIOGRAPHY;  Surgeon: Marykay Lex, MD;  Location: [facility] INVASIVE CV LAB;  Service: Cardiovascular;  Laterality: N/A;   LEFT HEART CATH AND CORONARY ANGIOGRAPHY N/A 03/21/2020   Procedure: LEFT HEART CATH AND CORONARY ANGIOGRAPHY;  Surgeon: Runell Gess, MD;  Location: [facility] INVASIVE CV LAB;  Service: Cardiovascular;  Laterality: N/A;   MINOR IRRIGATION AND DEBRIDEMENT OF WOUND Right 04/22/2014   Procedure: IRRIGATION AND DEBRIDEMENT OF RIGHT NECK ABCESS;  Surgeon: Osborn Coho, MD;  Location: [facility] OR;  Service: ENT;  Laterality: Right;   MULTIPLE EXTRACTIONS WITH ALVEOLOPLASTY N/A 01/18/2013   Procedure: MULTIPLE EXTRACION 3, 6, 7, 10, 11, 13, 21, 22, 27, 28, 29, 30 WITH ALVEOLOPLASTY;  Surgeon: Georgia Lopes, DDS;  Location: [facility] OR;  Service: Oral Surgery;   Laterality: N/A;   SKIN SPLIT GRAFT Right 03/21/2017   Procedure: IRRIGATION AND DEBRIDEMENT RIGHT FOOT AND APPLY SPLIT THICKNESS SKIN GRAFT AND WOUND VAC;  Surgeon: Nadara Mustard, MD;  Location: [facility] OR;  Service: Orthopedics;  Laterality: Right;   STUMP REVISION Left 12/02/2020   Procedure: REVISION LEFT BELOW KNEE AMPUTATION;  Surgeon: Nadara Mustard, MD;  Location: [facility] OR;  Service: Orthopedics;  Laterality: Left;   TEE WITHOUT CARDIOVERSION N/A 05/02/2017   Procedure: TRANSESOPHAGEAL ECHOCARDIOGRAM (TEE);  Surgeon: Elease Hashimoto Deloris Ping, MD;  Location: [facility] OR;  Service: Cardiovascular;  Laterality: N/A;  coincidental to orthopedic case   Family History  Family History  Problem Relation Age of Onset   Hypertension Mother    Arthritis Father    Hypertension Father  Hypertension Brother    Cancer Maternal Grandmother 27       unknown type of cancer   Depression Paternal Grandmother    Social History  reports that he has never smoked. He has never been exposed to tobacco smoke. He has never used smokeless tobacco. He reports that he does not currently use alcohol. He reports that he does not use drugs. Allergies  Allergies  Allergen Reactions   Elemental Sulfur Itching    Patient stated he's allergic to "sulfur" AND "sulfa"   Metformin And Related Other (See Comments)    Stopped by MD- affected kidneys   Zetia [Ezetimibe] Nausea Only   Sulfa Antibiotics Itching   Home medications Prior to Admission medications   Medication Sig Start Date End Date Taking? Authorizing Provider  allopurinol (ZYLOPRIM) 100 MG tablet Take 100 mg by mouth daily.    [provider]  aspirin EC 81 MG tablet Take 81 mg by mouth daily. Swallow whole.    [provider]  atorvastatin (LIPITOR) 80 MG tablet Take 1 tablet (80 mg total) by mouth daily. 09/01/20 04/15/22  Lanae Boast, MD  bictegravir-emtricitabine-tenofovir AF (BIKTARVY) 50-200-25 MG TABS tablet Take 1 tablet by mouth daily with supper.  03/18/22   Tyrone Nine, MD  bictegravir-emtricitabine-tenofovir AF (BIKTARVY) 50-200-25 MG TABS tablet Take 1 tablet by mouth daily. Try to take at the same time each day with or without food. 04/15/22   Danelle Earthly, MD  calcium acetate (PHOSLO) 667 MG capsule Take 2 capsules (1,334 mg total) by mouth 3 (three) times daily with meals. 03/18/22   Tyrone Nine, MD  clopidogrel (PLAVIX) 75 MG tablet Take 1 tablet (75 mg total) by mouth daily. 11/15/21   Corky Crafts, MD  dapsone 100 MG tablet Take 1 tablet (100 mg total) by mouth daily. 04/15/22   Danelle Earthly, MD  escitalopram (LEXAPRO) 20 MG tablet Take 1 tablet (20 mg total) by mouth daily. 10/19/21   Kathlen Mody, MD  ferrous sulfate 325 (65 FE) MG EC tablet Take 325 mg by mouth daily with breakfast.    [provider]  finasteride (PROSCAR) 5 MG tablet Take 5 mg by mouth daily. 08/20/21   [provider]  insulin aspart (NOVOLOG) 100 UNIT/ML injection Inject 0-9 Units into the skin 3 (three) times daily with meals. 03/18/22   Tyrone Nine, MD  insulin glargine (LANTUS) 100 UNIT/ML Solostar Pen Inject 10 Units into the skin daily. 03/18/22   Tyrone Nine, MD  Lancets [facility] ULTRASOFT) lancets Use as instructed 02/16/20   Kallie Locks, FNP  oxyCODONE-acetaminophen (PERCOCET/ROXICET) 5-325 MG tablet Take 1 tablet by mouth every 8 (eight) hours as needed for severe pain. 03/18/22   Tyrone Nine, MD  tamsulosin (FLOMAX) 0.4 MG CAPS capsule Take 1 capsule (0.4 mg total) by mouth daily after supper. 03/18/22   Tyrone Nine, MD     Vitals:   09/02/22 1146 09/02/22 1154  BP: 109/65   Pulse: 75   Resp: 18   Temp: (!) 97.5 F (36.4 C)   SpO2: 97%   Weight:  114.8 kg  Height:  5\' 10"  (1.778 m)   Exam Gen alert, no distress, room air No rash, cyanosis or gangrene Sclera anicteric, throat clear  No jvd or bruits Chest clear bilat to bases, no rales/ wheezing RRR no MRG Abd soft ntnd no mass or ascites  +bs GU normal male MS R BKA and L AKA Ext no LE  or UE edema, no wounds or ulcers Neuro is alert, Ox 3 , nf    LUA AVF+ strong bruit/ thrill, no sx's of infection    Home meds include - zyloprim, asa, lipitor, biktarvy, phoslo 2 ac, plavix, dapsone, lexapro, proscar, insulin aspart/ glargine, percocet, flomax, prns/ vits/ supps    OP HD: [facility] T[facility]  4h  350/1.5  120.5kg  2/2.5 bath  L AVF   Hep 6000+ 4000 midrun - last HD 5/23, post wt 119.4kg - rocaltrol 1.0 mcg po tiw - venofer 50mg  weekly IV - mircera 75 mcg IV q 2 wks, last 5/14, due 5/28   Assessment/ Plan: Malfunctioning L AVF - placed Dec 2023. Called HD unit who said that he infiltrated the access on Sat, then was sent home and told to come back today. Today they got 1 needle but the 2nd needle was "pulling clots". Today on exam there is excellent bruit throughout the AVF. Will try HD today/ tonight upstairs- perhaps this is just an infiltration causing difficulty sticking and the AVF itself is working properly. IR consulted per ED in case cannot get the AVF to work here.  Hyperkalemia - given IV Ca/ bicarb and po lokelma in ED. Attempting HD this evening.  ESRD - on HD TTS. Hx as above. Plan as above.  HTN/ volume - no vol excess on exam, on RA.  Anemia esrd - Hb 9.8 here, esa is due tomorrow. Will order darbe 75 mcg SQ weekly while here, starting this evening.  MBD ckd - CCa in range. Cont po vdra.  PAD - hx bilat LE amputation IDDM  HIV - on ART Depression       Rob Bretta Fees  MD [facility] 09/02/2022, 2:59 PM  Recent Labs  Lab 09/02/22 1204  HGB 9.8*  ALBUMIN 3.9  CALCIUM 9.1  CREATININE 8.07*  K 6.1*   Inpatient medications:  calcium gluconate  1 g Intravenous Once   furosemide  40 mg Intravenous Once   sodium bicarbonate  50 mEq Intravenous Once   sodium zirconium cyclosilicate  10 g Oral Once

## 2022-09-02 NOTE — ED Provider Notes (Signed)
 [place] EMERGENCY DEPARTMENT AT [facility] Provider Note   CSN: [id number] Arrival date & time: 09/02/22  1140     History Chief Complaint  Patient presents with   Vascular Access Problem    Keith [name] is a 57 y.o. male.  Patient with history of end-stage renal disease presents to the emergency department with concerns of his hemodialysis catheter.  He reports that he was seen at dialysis on Thursday of last week and was unable to have dialysis performed due to concerns about catheter site appearing somewhat swollen.  He returned for dialysis on Saturday and was again told that dialysis cannot be performed as there was no flow going through the catheter site.  Patient denies any significant pain in the area but does reports notable swelling.  Denies any fevers, chest pain, shortness of breath, nausea, vomiting, abdominal pain.  HPI     Home Medications Prior to Admission medications   Medication Sig Start Date End Date Taking? Authorizing Provider  allopurinol (ZYLOPRIM) 100 MG tablet Take 100 mg by mouth daily.    [provider]  aspirin EC 81 MG tablet Take 81 mg by mouth daily. Swallow whole.    [provider]  atorvastatin (LIPITOR) 80 MG tablet Take 1 tablet (80 mg total) by mouth daily. 09/01/20 04/15/22  Lanae Boast, MD  bictegravir-emtricitabine-tenofovir AF (BIKTARVY) 50-200-25 MG TABS tablet Take 1 tablet by mouth daily with supper. 03/18/22   Tyrone Nine, MD  bictegravir-emtricitabine-tenofovir AF (BIKTARVY) 50-200-25 MG TABS tablet Take 1 tablet by mouth daily. Try to take at the same time each day with or without food. 04/15/22   Danelle Earthly, MD  calcium acetate (PHOSLO) 667 MG capsule Take 2 capsules (1,334 mg total) by mouth 3 (three) times daily with meals. 03/18/22   Tyrone Nine, MD  clopidogrel (PLAVIX) 75 MG tablet Take 1 tablet (75 mg total) by mouth daily. 11/15/21   Corky Crafts, MD  dapsone 100 MG tablet Take 1 tablet  (100 mg total) by mouth daily. 04/15/22   Danelle Earthly, MD  escitalopram (LEXAPRO) 20 MG tablet Take 1 tablet (20 mg total) by mouth daily. 10/19/21   Kathlen Mody, MD  ferrous sulfate 325 (65 FE) MG EC tablet Take 325 mg by mouth daily with breakfast.    [provider]  finasteride (PROSCAR) 5 MG tablet Take 5 mg by mouth daily. 08/20/21   [provider]  insulin aspart (NOVOLOG) 100 UNIT/ML injection Inject 0-9 Units into the skin 3 (three) times daily with meals. 03/18/22   Tyrone Nine, MD  insulin glargine (LANTUS) 100 UNIT/ML Solostar Pen Inject 10 Units into the skin daily. 03/18/22   Tyrone Nine, MD  Lancets [facility] ULTRASOFT) lancets Use as instructed 02/16/20   Kallie Locks, FNP  oxyCODONE-acetaminophen (PERCOCET/ROXICET) 5-325 MG tablet Take 1 tablet by mouth every 8 (eight) hours as needed for severe pain. 03/18/22   Tyrone Nine, MD  tamsulosin (FLOMAX) 0.4 MG CAPS capsule Take 1 capsule (0.4 mg total) by mouth daily after supper. 03/18/22   Tyrone Nine, MD      Allergies    Elemental sulfur, Metformin and related, Zetia [ezetimibe], and Sulfa antibiotics    Review of Systems   Review of Systems  All other systems reviewed and are negative.   Physical Exam Updated Vital Signs BP 109/65   Pulse 75   Temp (!) 97.5 F (36.4 C)   Resp 18  Ht 5\' 10"  (1.778 m)   Wt 114.8 kg   SpO2 97%   BMI 36.30 kg/m  Physical Exam Vitals and nursing note reviewed.  Constitutional:      General: He is not in acute distress.    Appearance: He is well-developed.  HENT:     Head: Normocephalic and atraumatic.  Eyes:     Conjunctiva/sclera: Conjunctivae normal.  Cardiovascular:     Rate and Rhythm: Normal rate and regular rhythm.     Heart sounds: No murmur heard.    Comments: Thrill and audible bruit in left AV fistula. A firm area proximal to fistula site concerning for potential thrombus/pseudoaneurysm. Pulmonary:     Effort: Pulmonary effort is  normal. No respiratory distress.     Breath sounds: Normal breath sounds.  Abdominal:     Palpations: Abdomen is soft.     Tenderness: There is no abdominal tenderness.  Musculoskeletal:        General: No swelling.     Cervical back: Neck supple.  Skin:    General: Skin is warm and dry.     Capillary Refill: Capillary refill takes less than 2 seconds.  Neurological:     Mental Status: He is alert.  Psychiatric:        Mood and Affect: Mood normal.     ED Results / Procedures / Treatments   Labs (all labs ordered are listed, but only abnormal results are displayed) Labs Reviewed  CBC WITH DIFFERENTIAL/PLATELET - Abnormal; Notable for the following components:      Result Value   RBC 3.12 (*)    Hemoglobin 9.8 (*)    HCT 32.0 (*)    MCV 102.6 (*)    RDW 16.0 (*)    All other components within normal limits  COMPREHENSIVE METABOLIC PANEL - Abnormal; Notable for the following components:   Potassium 6.1 (*)    Glucose, Bld 157 (*)    BUN 63 (*)    Creatinine, Ser 8.07 (*)    GFR, Estimated 7 (*)    All other components within normal limits  HEPATITIS B SURFACE ANTIBODY, QUANTITATIVE  HEPATITIS B SURFACE ANTIGEN  HEMOGLOBIN A1C    EKG EKG Interpretation  Date/Time:  Monday Sep 02 2022 14:00:31 EDT Ventricular Rate:  67 PR Interval:  194 QRS Duration: 122 QT Interval:  428 QTC Calculation: 452 R Axis:   -14 Text Interpretation: Sinus rhythm Nonspecific intraventricular conduction delay Inferior infarct, old Confirmed by Alvino Blood ([id number]) on 09/02/2022 2:07:38 PM  Radiology No results found.  Procedures Procedures   Medications Ordered in ED Medications  calcium gluconate inj 10% (1 g) URGENT USE ONLY! (has no administration in time range)  furosemide (LASIX) injection 40 mg (has no administration in time range)  sodium bicarbonate injection 50 mEq (has no administration in time range)  Chlorhexidine Gluconate Cloth 2 % PADS 6 each (has no  administration in time range)  calcitRIOL (ROCALTROL) capsule 1 mcg (has no administration in time range)  Darbepoetin Alfa (ARANESP) injection 60 mcg (has no administration in time range)  acetaminophen (TYLENOL) tablet 650 mg (has no administration in time range)    Or  acetaminophen (TYLENOL) suppository 650 mg (has no administration in time range)  zolpidem (AMBIEN) tablet 5 mg (has no administration in time range)  sorbitol 70 % solution 30 mL (has no administration in time range)  docusate sodium (ENEMEEZ) enema 283 mg (has no administration in time range)  ondansetron (ZOFRAN) tablet 4 mg (has  no administration in time range)    Or  ondansetron (ZOFRAN) injection 4 mg (has no administration in time range)  camphor-menthol (SARNA) lotion 1 Application (has no administration in time range)    And  hydrOXYzine (ATARAX) tablet 25 mg (has no administration in time range)  calcium carbonate (dosed in mg elemental calcium) suspension 500 mg of elemental calcium (has no administration in time range)  feeding supplement (NEPRO CARB STEADY) liquid 237 mL (has no administration in time range)  heparin injection 5,000 Units (has no administration in time range)  sodium chloride flush (NS) 0.9 % injection 3 mL (has no administration in time range)  hydrALAZINE (APRESOLINE) injection 5 mg (has no administration in time range)  insulin aspart (novoLOG) injection 0-6 Units (has no administration in time range)  sodium zirconium cyclosilicate (LOKELMA) packet 10 g (10 g Oral Given 09/02/22 1601)    ED Course/ Medical Decision Making/ A&P Clinical Course as of 09/02/22 1611  Mon Sep 02, 2022  1454 Monocytes Relative: 6 [OZ]    Clinical Course User Index [OZ] Smitty Knudsen, PA-C                           Medical Decision Making Amount and/or Complexity of Data Reviewed Labs: ordered. Decision-making details documented in ED Course.  Risk Decision regarding hospitalization.   This  patient presents to the ED for concern of vascular access problem.  Differential diagnosis includes he missed hemodialysis, hyperkalemia, cellulitis, DVT   Lab Tests:  I Ordered, and personally interpreted labs.  The pertinent results include: CBC at baseline, CMP with notable hyperkalemia at 6.1  EKG Interpretation:  I personally interpreted the EKG on this encounter which demonstrated a primary rhythm of normal sinus rhythm.  Some slight peaking of T waves noted.  Medicines ordered and prescription drug management:  I ordered medication including Lokelma, Lasix, sodium bicarb, calcium gluconate for hyperkalemia Reevaluation of the patient after these medicines showed that the patient stayed the same I have reviewed the patients home medicines and have made adjustments as needed   Problem List / ED Course:  Patient presents to the emergency department complaints of fistula access problem.  He reports that he has been to 2 dialysis sessions for the last 2 days that he is not able to complete as the staff there is unable to access the site.  The staff there is reported to them that it appears that his access site is clotted off.  There is a notable bump to the left brachiocephalic fistula site.  Thrill is still palpable bruit noted.  Will check basic labs and EKG to assess patient's volume status/electrolyte status. Patient has mild peaking of T waves which is expected given that potassium has come back at 6.1.  Will begin hyperkalemia treatment and consult with nephrology for further recommendations and evaluation.  Patient will require admission. Spoke with Dr. Arlean Hopping, nephrology, will advise hyperkalemia treatment and medical admission for hemodialysis.  Will need to reconsult with IR and have them evaluate the patient for potential treatment not emergently either later today or tomorrow.  Will also need to consult for medical admission. Spoke with Dr. Fredia Sorrow who has informed the IR team  of patient. Plan is to place temporary catheter into patient to allow for HD. Will lastly consult hospitalist for medical admission. Spoke with Dr. Ophelia Charter, hospitalist, who will graciously be admitting patient for nephrology evaluation and IR intervention either later today or  tomorrow morning.  Final Clinical Impression(s) / ED Diagnoses Final diagnoses:  ESRD (end stage renal disease) (HCC)  Hyperkalemia  Complication of vascular access for dialysis, initial encounter    Rx / DC Orders ED Discharge Orders     None         Smitty Knudsen, PA-C 09/02/22 1611    Lonell Grandchild, MD 09/03/22 [phone]

## 2022-09-02 NOTE — Progress Notes (Signed)
Received patient in bed to unit.  Alert and oriented.  Informed consent signed and in chart.   TX duration:0  Patient tolerated well.  Transported back to the room  Alert, without acute distress.  Hand-off given to patient's nurse.   Access used: 0 Access issues: unable to cannulate access  Total UF removed: 0 Medication(s) given: 0 Post HD VS: 119/71 hr 88 Post HD weight:124.2   [facility] unable to successfully cannulate pt A/v fistula For hemodialysis treatment after several attempts.Dr. Juel Burrow notified; Dr. Stann Mainland access the Pt's left upper arm fistula in the Morning.

## 2022-09-02 NOTE — Progress Notes (Signed)
NEW ADMISSION NOTE New Admission Note:   Arrival Method: Patient arrived from ED on stretcher accompanied by staff. Mental Orientation: alert and oriented x 4. Telemetry: 5M04, NSR Assessment: Completed Skin:warm dry and intact IV: R FA SL Pain: Denies any pain. Tubes: N/A Safety Measures: Safety Fall Prevention Plan has been given, discussed and signed Admission: in progress [facility] Orientation: Patient has been orientated to the room, unit and staff.  Family: NOne  Orders have been reviewed and implemented. Will continue to monitor the patient. Call light has been placed within reach and bed alarm has been activated.   Arvilla Meres, RN

## 2022-09-02 NOTE — H&P (Signed)
History and Physical    Patient: Keith [name]:[id number] DOB: 04-16-1965 DOA: 09/02/2022 DOS: the patient was seen and examined on 09/02/2022 PCP: Barbette Merino, NP  Patient coming from: Home -  lives with mother; Jackey Loge: Mother, [phone]    Chief Complaint: Fistula malfunction  HPI: Keith [name] is a 57 y.o. male with medical history significant of HTN, HIV, DM, chronic pain, ESRD on HD, BPH, and PVD s/p L AKA and R BKA presenting with fistula malfunction.  He was last admitted from 12/2-11 with Salmonella and EPEC gastroenteritis and transitioned from stage 4 CKD to ESRD and was started on HD.  He denies current issues other than fistula malfunction, has not complaints at this time.    ER Course:  Nephrology and IR consulted for fistula malfunction.  No HD Saturday or today.  +thrill, bruit.  K+ 6.1, peaked T waves, given Lokelma, Ca++/HCO3.  HCO3 will place TDC.       Review of Systems: As mentioned in the history of present illness. All other systems reviewed and are negative. Past Medical History:  Diagnosis Date   Abscess of right foot    abscess/ulcer of R transtibial amputation requiring IV abx   Acute ST elevation myocardial infarction (STEMI) due to occlusion of circumflex coronary artery (HCC) 03/22/2020   AIDS (HCC)    Anemia    CAD (coronary artery disease)    a. MI with stenting of OM1 in 11/2018 with residual disease. b. acute STEMI 03/2020 s/p DES to OM1   Chronic anemia    Chronic knee pain    right   Chronic pain    CKD (chronic kidney disease), stage IV (HCC)    CVA (cerebral vascular accident) (HCC) 04/2020   Diabetes type 2, uncontrolled    HgA1c 17.6 (04/27/2010)   Diabetic foot ulcer (HCC) 01/2017   right foot   Dilatation of aorta (HCC)    Erectile dysfunction    Genital warts    GERD (gastroesophageal reflux disease)    History of blood transfusion    HIV (human immunodeficiency virus infection) (HCC) 2009   CD4 count 100, VL 13800  (05/01/2010)   Hyperlipidemia    Hypertension    Myocardial infarction [facility])    Neuropathy    Noncompliance with medication regimen    Osteomyelitis (HCC)    h/o hand   Osteomyelitis of metatarsal (HCC) 04/28/2017   Pneumonia    STEMI (ST elevation myocardial infarction) (HCC) 11/10/2018   Past Surgical History:  Procedure Laterality Date   AMPUTATION Right 05/02/2017   Procedure: AMPUTATION TRANSMETARSAL;  Surgeon: Nadara Mustard, MD;  Location: [facility] OR;  Service: Orthopedics;  Laterality: Right;   AMPUTATION Right 06/13/2017   Procedure: RIGHT BELOW KNEE AMPUTATION;  Surgeon: Nadara Mustard, MD;  Location: [facility] OR;  Service: Orthopedics;  Laterality: Right;   AMPUTATION Left 10/27/2020   Procedure: LEFT BELOW KNEE AMPUTATION;  Surgeon: Nadara Mustard, MD;  Location: [facility] OR;  Service: Orthopedics;  Laterality: Left;   AMPUTATION Left 11/17/2020   Procedure: REVISION AMPUTATION BELOW KNEE, LEFT;  Surgeon: Nadara Mustard, MD;  Location: [facility] OR;  Service: Orthopedics;  Laterality: Left;   AMPUTATION Left 01/03/2021   Procedure: LEFT ABOVE KNEE AMPUTATION;  Surgeon: Nadara Mustard, MD;  Location: [facility] OR;  Service: Orthopedics;  Laterality: Left;   APPLICATION OF WOUND VAC Left 10/27/2020   Procedure: APPLICATION OF WOUND VAC;  Surgeon: Nadara Mustard, MD;  Location: [facility] OR;  Service:  Orthopedics;  Laterality: Left;   AV FISTULA PLACEMENT Left 03/14/2022   Procedure: LEFT BRACHIOCEPHALIC ARTERIOVENOUS (AV) FISTULA CREATION;  Surgeon: Victorino Sparrow, MD;  Location: [facility] OR;  Service: Vascular;  Laterality: Left;  PERIPHERAL NERVE BLOCK   BELOW KNEE LEG AMPUTATION Right 06/13/2017   BELOW KNEE LEG AMPUTATION     CORONARY STENT INTERVENTION N/A 11/10/2018   Procedure: CORONARY STENT INTERVENTION;  Surgeon: Marykay Lex, MD;  Location: [facility] INVASIVE CV LAB;  Service: Cardiovascular;  Laterality: N/A;   CORONARY/GRAFT ACUTE MI REVASCULARIZATION N/A 03/21/2020   Procedure: Coronary/Graft Acute MI  Revascularization;  Surgeon: Runell Gess, MD;  Location: [facility] INVASIVE CV LAB;  Service: Cardiovascular;  Laterality: N/A;   CYSTOSCOPY N/A 11/09/2020   Procedure: CYSTOSCOPY, EVACUATION OF CLOTS, FULGERATION OF BLADDER AND BILATERIAL RETROGRADE PYELOGRAMS;  Surgeon: Jannifer Hick, MD;  Location: [facility] OR;  Service: Urology;  Laterality: N/A;   HERNIA REPAIR     I & D EXTREMITY Left 08/21/2014   Procedure: INCISION AND DRAINAGE LEFT SMALL FINGER;  Surgeon: Betha Loa, MD;  Location: [facility] OR;  Service: Orthopedics;  Laterality: Left;   I & D EXTREMITY Right 03/18/2017   Procedure: IRRIGATION AND DEBRIDEMENT EXTREMITY;  Surgeon: Nadara Mustard, MD;  Location: [facility] OR;  Service: Orthopedics;  Laterality: Right;   IR FLUORO GUIDE CV LINE RIGHT  11/02/2020   IR FLUORO GUIDE CV LINE RIGHT  03/12/2022   IR REMOVAL TUN CV CATH W/O FL  11/13/2020   IR US GUIDE VASC ACCESS RIGHT  11/02/2020   IR US GUIDE VASC ACCESS RIGHT  03/12/2022   IR VENOCAVAGRAM SVC  03/12/2022   LEFT HEART CATH AND CORONARY ANGIOGRAPHY N/A 11/10/2018   Procedure: LEFT HEART CATH AND CORONARY ANGIOGRAPHY;  Surgeon: Marykay Lex, MD;  Location: [facility] INVASIVE CV LAB;  Service: Cardiovascular;  Laterality: N/A;   LEFT HEART CATH AND CORONARY ANGIOGRAPHY N/A 03/21/2020   Procedure: LEFT HEART CATH AND CORONARY ANGIOGRAPHY;  Surgeon: Runell Gess, MD;  Location: [facility] INVASIVE CV LAB;  Service: Cardiovascular;  Laterality: N/A;   MINOR IRRIGATION AND DEBRIDEMENT OF WOUND Right 04/22/2014   Procedure: IRRIGATION AND DEBRIDEMENT OF RIGHT NECK ABCESS;  Surgeon: Osborn Coho, MD;  Location: [facility] OR;  Service: ENT;  Laterality: Right;   MULTIPLE EXTRACTIONS WITH ALVEOLOPLASTY N/A 01/18/2013   Procedure: MULTIPLE EXTRACION 3, 6, 7, 10, 11, 13, 21, 22, 27, 28, 29, 30 WITH ALVEOLOPLASTY;  Surgeon: Georgia Lopes, DDS;  Location: [facility] OR;  Service: Oral Surgery;  Laterality: N/A;   SKIN SPLIT GRAFT Right 03/21/2017   Procedure: IRRIGATION AND DEBRIDEMENT  RIGHT FOOT AND APPLY SPLIT THICKNESS SKIN GRAFT AND WOUND VAC;  Surgeon: Nadara Mustard, MD;  Location: [facility] OR;  Service: Orthopedics;  Laterality: Right;   STUMP REVISION Left 12/02/2020   Procedure: REVISION LEFT BELOW KNEE AMPUTATION;  Surgeon: Nadara Mustard, MD;  Location: [facility] OR;  Service: Orthopedics;  Laterality: Left;   TEE WITHOUT CARDIOVERSION N/A 05/02/2017   Procedure: TRANSESOPHAGEAL ECHOCARDIOGRAM (TEE);  Surgeon: Elease Hashimoto Deloris Ping, MD;  Location: [facility] OR;  Service: Cardiovascular;  Laterality: N/A;  coincidental to orthopedic case   Social History:  reports that he has never smoked. He has never been exposed to tobacco smoke. He has never used smokeless tobacco. He reports that he does not currently use alcohol. He reports that he does not use drugs.  Allergies  Allergen Reactions   Elemental Sulfur Itching   Metformin And Related Other (See  Comments)    Stopped by MD- affected kidneys   Zetia [Ezetimibe] Nausea Only   Sulfa Antibiotics Itching    Family History  Problem Relation Age of Onset   Hypertension Mother    Arthritis Father    Hypertension Father    Hypertension Brother    Cancer Maternal Grandmother 47       unknown type of cancer   Depression Paternal Grandmother     Prior to Admission medications   Medication Sig Start Date End Date Taking? Authorizing Provider  allopurinol (ZYLOPRIM) 100 MG tablet Take 100 mg by mouth daily.    [provider]  aspirin EC 81 MG tablet Take 81 mg by mouth daily. Swallow whole.    [provider]  atorvastatin (LIPITOR) 80 MG tablet Take 1 tablet (80 mg total) by mouth daily. 09/01/20 04/15/22  Lanae Boast, MD  bictegravir-emtricitabine-tenofovir AF (BIKTARVY) 50-200-25 MG TABS tablet Take 1 tablet by mouth daily with supper. 03/18/22   Tyrone Nine, MD  bictegravir-emtricitabine-tenofovir AF (BIKTARVY) 50-200-25 MG TABS tablet Take 1 tablet by mouth daily. Try to take at the same time each day with or without  food. 04/15/22   Danelle Earthly, MD  calcium acetate (PHOSLO) 667 MG capsule Take 2 capsules (1,334 mg total) by mouth 3 (three) times daily with meals. 03/18/22   Tyrone Nine, MD  clopidogrel (PLAVIX) 75 MG tablet Take 1 tablet (75 mg total) by mouth daily. 11/15/21   Corky Crafts, MD  dapsone 100 MG tablet Take 1 tablet (100 mg total) by mouth daily. 04/15/22   Danelle Earthly, MD  escitalopram (LEXAPRO) 20 MG tablet Take 1 tablet (20 mg total) by mouth daily. 10/19/21   Kathlen Mody, MD  ferrous sulfate 325 (65 FE) MG EC tablet Take 325 mg by mouth daily with breakfast.    [provider]  finasteride (PROSCAR) 5 MG tablet Take 5 mg by mouth daily. 08/20/21   [provider]  insulin aspart (NOVOLOG) 100 UNIT/ML injection Inject 0-9 Units into the skin 3 (three) times daily with meals. 03/18/22   Tyrone Nine, MD  insulin glargine (LANTUS) 100 UNIT/ML Solostar Pen Inject 10 Units into the skin daily. 03/18/22   Tyrone Nine, MD  Lancets [facility] ULTRASOFT) lancets Use as instructed 02/16/20   Kallie Locks, FNP  oxyCODONE-acetaminophen (PERCOCET/ROXICET) 5-325 MG tablet Take 1 tablet by mouth every 8 (eight) hours as needed for severe pain. 03/18/22   Tyrone Nine, MD  tamsulosin (FLOMAX) 0.4 MG CAPS capsule Take 1 capsule (0.4 mg total) by mouth daily after supper. 03/18/22   Tyrone Nine, MD    Physical Exam: Vitals:   09/02/22 1146 09/02/22 1154 09/02/22 1638  BP: 109/65    Pulse: 75    Resp: 18    Temp: (!) 97.5 F (36.4 C)  (!) 97.5 F (36.4 C)  TempSrc:   Oral  SpO2: 97%    Weight:  114.8 kg   Height:  5\' 10"  (1.778 m)    General:  Appears calm and comfortable and is in NAD, malodorous Eyes:   EOMI, normal lids, iris ENT:  grossly normal hearing, lips & tongue, mmm; edentulous Neck:  no LAD, masses or thyromegaly Cardiovascular:  RRR, no m/r/g. No LE edema.  Respiratory:   CTA bilaterally with no wheezes/rales/rhonchi.  Normal respiratory  effort. Abdomen:  soft, NT, ND Skin:  no rash or induration seen on limited exam Musculoskeletal:  L AKA, R BKA  with stumps C/D/I Psychiatric:  blunted mood and affect, speech fluent and appropriate, AOx3 Neurologic:  CN 2-12 grossly intact, moves all extremities in coordinated fashion   Radiological Exams on Admission: Independently reviewed - see discussion in A/P where applicable  No results found.  EKG: Independently reviewed.  NSR with rate 67; peaked T waves; nonspecific ST changes with no evidence of acute ischemia   Labs on Admission: I have personally reviewed the available labs and imaging studies at the time of the admission.  Pertinent labs:    K+ 6.1 BUN 63/Creatinine 8.07/GFR 7 WBC 7.1 Hgb 9.8   Assessment and Plan: Principal Problem:   Dialysis AV fistula malfunction, initial encounter (HCC) Active Problems:   HTN (hypertension)   Chronic pain   Hyperlipidemia   HIV disease (HCC)   Uncontrolled type 2 diabetes mellitus with hyperglycemia, with long-term current use of insulin with renal complication   PAD (peripheral artery disease) (HCC)   Obesity   BPH (benign prostatic hyperplasia)   Hyperkalemia, diminished renal excretion   ESRD on dialysis (HCC)    Assessment and Plan: No notes have been filed under this hospital service. Service: Hospitalist  Fistula malfunction -Patient with LUE fistula presenting with fistula malfunction -He has been unable to dialyze since last session on 5/23 -His fistula has a thrill/bruit and so hopefully will be functional -Nephrology is consulting -Possible HD tonight vs. In AM -IR is also consulting for possible [facility] placement -Per his last vascular note on 1/12, "the fistula is not fully matured and is a little deeper than ideal" and so they recommended repeat duplex in 4 weeks with plan for possible angiogram or surgical revision of the fistula prior to use; there is no evidence that he followed up -He may need a  vascular surgery consult, should the fistula not be accessible  Hyperkalemia -Patient with acute/subacute hyperkalemia which appears to be resulting from CKD and lack of HD -Was given insulin/glucose and Lokelma in ER as well as calcium gluconate -Appears to have peaked T waves  -Continue to monitor on telemetry -Anticipate resolution with HD -Recheck BMP in AM   HIV -He was last seen by Dr. Thedore Mins in 04/2021   -At that time, his viral load was 8540 and CD4 89 -He was due for RTC in 1 month, which did not happen -Continue Biktarvy, Dapsone -Needs outpatient ID Clinic f/u   ESRD -Patient on chronic TTS HD -Nephrology prn order set utilized -Nephrology is aware that patient will need HD and is consulting -Continue Phoslo   DM -A1c was 11.5 in 12/2021 - very poor control -Will check A1c -Hold Trulicity -Continue glargine (for now, 5 units qhs but may need up-titration) -Cover with very sensitive-scale SSI   HTN -No current medications listed  PVD -S/p L AKA, R BKA -Continue ASA, Plavix, Lipitor   Chronic pain -I have reviewed this patient in the [place] Controlled Substances Reporting System.  He is receiving medications from only one provider and appears to be taking them as prescribed. -He is at particularly high risk of opioid misuse, diversion, or overdose.  -Continue duloxetine, although he also appears to be taking escitalopram (will hold for now) -Will continue home oxycodone without escalation  Gout -Continue allopurinol  BPH -Continue finasteride, tamsulosin  Obesity -Body mass index is 36.3 kg/m..  -Weight loss should be encouraged -Outpatient PCP/bariatric medicine f/u encouraged     Advance Care Planning:   Code Status: Full Code - Code status was discussed with the  patient and/or family at the time of admission.  The patient would want to receive full resuscitative measures at this time.   Consults: Nephrology; IR; may need vascular surgery  DVT  Prophylaxis: Heparin  Family Communication: None present; he declined to have me call family at the time of admission  Severity of Illness: The appropriate patient status for this patient is OBSERVATION. Observation status is judged to be reasonable and necessary in order to provide the required intensity of service to ensure the patient's safety. The patient's presenting symptoms, physical exam findings, and initial radiographic and laboratory data in the context of their medical condition is felt to place them at decreased risk for further clinical deterioration. Furthermore, it is anticipated that the patient will be medically stable for discharge from the hospital within 2 midnights of admission.   Author: Jonah Blue, MD 09/02/2022 4:57 PM  For on call review [email or web address].

## 2022-09-02 NOTE — ED Notes (Addendum)
ED TO INPATIENT HANDOFF REPORT  ED Nurse Name and Phone #: Livvy Spilman/ [phone]  S Name/Age/Gender Keith [name] 57 y.o. male Room/Bed: 012C/012C  Code Status   Code Status: Full Code  Home/SNF/Other Home Patient oriented to: self, place, time, and situation Is this baseline? Yes   Triage Complete: Triage complete  Chief Complaint Dialysis AV fistula malfunction, initial encounter [facility]) [T82.590A]  Triage Note Reports went to dialysis today and was told his fistula is not working. Swelling noted but +bruit  Reports went Saturday and was told it was too swollen to do and to come back Monday and went today and they reported it keeps clogging.    Allergies Allergies  Allergen Reactions   Elemental Sulfur Itching   Metformin And Related Other (See Comments)    Stopped by MD- affected kidneys   Zetia [Ezetimibe] Nausea Only   Sulfa Antibiotics Itching    Level of Care/Admitting Diagnosis ED Disposition     ED Disposition  Admit   Condition  --   Comment  Hospital Area: [facility] [[id number]]  Level of Care: Telemetry Medical [[id number]]  May place patient in observation at [facility] or [facility] if equivalent level of care is available:: No  Covid Evaluation: Asymptomatic - no recent exposure (last 10 days) testing not required  Diagnosis: Dialysis AV fistula malfunction, initial encounter [facility]) [[id number]]  Admitting Physician: Jonah Blue [[id number]]  Attending Physician: Jonah Blue [[id number]]          B Medical/Surgery History Past Medical History:  Diagnosis Date   Abscess of right foot    abscess/ulcer of R transtibial amputation requiring IV abx   Acute ST elevation myocardial infarction (STEMI) due to occlusion of circumflex coronary artery (HCC) 03/22/2020   AIDS (HCC)    Anemia    CAD (coronary artery disease)    a. MI with stenting of OM1 in 11/2018 with residual disease. b. acute STEMI 03/2020 s/p DES to OM1   Chronic anemia    Chronic  knee pain    right   Chronic pain    CKD (chronic kidney disease), stage IV (HCC)    CVA (cerebral vascular accident) (HCC) 04/2020   Diabetes type 2, uncontrolled    HgA1c 17.6 (04/27/2010)   Diabetic foot ulcer (HCC) 01/2017   right foot   Dilatation of aorta (HCC)    Erectile dysfunction    Genital warts    GERD (gastroesophageal reflux disease)    History of blood transfusion    HIV (human immunodeficiency virus infection) (HCC) 2009   CD4 count 100, VL 13800 (05/01/2010)   Hyperlipidemia    Hypertension    Myocardial infarction [facility])    Neuropathy    Noncompliance with medication regimen    Osteomyelitis (HCC)    h/o hand   Osteomyelitis of metatarsal (HCC) 04/28/2017   Pneumonia    STEMI (ST elevation myocardial infarction) (HCC) 11/10/2018   Past Surgical History:  Procedure Laterality Date   AMPUTATION Right 05/02/2017   Procedure: AMPUTATION TRANSMETARSAL;  Surgeon: Nadara Mustard, MD;  [facility] OR;  Service: Orthopedics;  Laterality: Right;   AMPUTATION Right 06/13/2017   Procedure: RIGHT BELOW KNEE AMPUTATION;  Surgeon: Nadara Mustard, MD;  Location: [facility] OR;  Service: Orthopedics;  Laterality: Right;   AMPUTATION Left 10/27/2020   Procedure: LEFT BELOW KNEE AMPUTATION;  Surgeon: Nadara Mustard, MD;  Location: [facility] OR;  Service: Orthopedics;  Laterality: Left;   AMPUTATION Left 11/17/2020   Procedure:  REVISION AMPUTATION BELOW KNEE, LEFT;  Surgeon: Nadara Mustard, MD;  Location[facility] OR;  Service: Orthopedics;  Laterality: Left;   AMPUTATION Left 01/03/2021   Procedure: LEFT ABOVE KNEE AMPUTATION;  Surgeon: Nadara Mustard, MD;  Location: [facility] OR;  Service: Orthopedics;  Laterality: Left;   APPLICATION OF WOUND VAC Left 10/27/2020   Procedure: APPLICATION OF WOUND VAC;  Surgeon: Nadara Mustard, MD;  Location: [facility] OR;  Service: Orthopedics;  Laterality: Left;   AV FISTULA PLACEMENT Left 03/14/2022   Procedure: LEFT BRACHIOCEPHALIC ARTERIOVENOUS (AV) FISTULA CREATION;  Surgeon:  Victorino Sparrow, MD;  Location: [facility] OR;  Service: Vascular;  Laterality: Left;  PERIPHERAL NERVE BLOCK   BELOW KNEE LEG AMPUTATION Right 06/13/2017   BELOW KNEE LEG AMPUTATION     CORONARY STENT INTERVENTION N/A 11/10/2018   Procedure: CORONARY STENT INTERVENTION;  Surgeon: Marykay Lex, MD;  Location: [facility] INVASIVE CV LAB;  Service: Cardiovascular;  Laterality: N/A;   CORONARY/GRAFT ACUTE MI REVASCULARIZATION N/A 03/21/2020   Procedure: Coronary/Graft Acute MI Revascularization;  Surgeon: Runell Gess, MD;  [facility]: [facility] INVAS[facility] CV LAB;  Service: Cardiovascular;  Laterality: N/A;   CYSTOSCOPY N/A 11/09/2020   Procedure: CYSTOSCOPY, EVACUATION OF CLOTS, FULGERATION OF BLADDER AND BILATERIAL RETROGRADE PYELOGRAMS;  Surgeon: Jannifer Hick, MD;  Location[facility] OR;  Service: Urology;  Laterality: N/A;   HERNIA REPAIR     I & D EXTREMITY Left 08/21/2014   Procedure: INCISION AND DRAINAGE LEFT SMALL FINGER;  Surgeon: Betha Loa, MD;  Location: [facility] OR;  Service: Orthopedics;  Laterality: Left;   I & D EXTREMITY Right 03/18/2017   Procedure: IRRIGATION AND DEBRIDEMENT EXTREMITY;  Surgeon: Nadara Mustard, MD;  Location: [facility] OR;  Service: Orthopedics;  Laterality: Right;   IR FLUORO GUIDE CV LINE RIGHT  11/02/2020   IR FLUORO GUIDE CV LINE RIGHT  03/12/2022   IR REMOVAL TUN CV CATH W/O FL  11/13/2020   IR US GUIDE VASC ACCESS RIGHT  11/02/2020   IR US GUIDE VASC ACCESS RIGHT  03/12/2022   IR VENOCAVAGRAM SVC  03/12/2022   LEFT HEART CATH AND CORONARY ANGIOGRAPHY N/A 11/10/2018   Procedure: LEFT HEART CATH AND CORONARY ANGIOGRAPHY;  Surgeon: Marykay Lex, MD;  Location: [facility] INVASIVE CV LAB;  Service: Cardiovascular;  Laterality: N/A;   LEFT HEART CATH AND CORONARY ANGIOGRAPHY N/A 03/21/2020   Procedure: LEFT HEART CATH AND CORONARY ANGIOGRAPHY;  Surgeon: Runell Gess, MD;  [facility] INVAS[facility] CV LAB;  Service: Cardiovascular;  Laterality: N/A;   MINOR IRRIGATION AND DEBRIDEMENT OF WOUND Right 04/22/2014    Procedure: IRRIGATION AND DEBRIDEMENT OF RIGHT NECK ABCESS;  Surgeon: Osborn Coho, MD;  Location[facility] OR;  Service: ENT;  Laterality: Right;   MULTIPLE EXTRACTIONS WITH ALVEOLOPLASTY N/A 01/18/2013   Procedure: MULTIPLE EXTRACION 3, 6, 7, 10, 11, 13, 21, 22, 27, 28, 29, 30 WITH ALVEOLOPLASTY;  Surgeon: Georgia Lopes, DDS;  Location: [facility] OR;  Service: Oral Surgery;  Laterality: N/A;   SKIN SPLIT GRAFT Right 03/21/2017   Procedure: IRRIGATION AND DEBRIDEMENT RIGHT FOOT AND APPLY SPLIT THICKNESS SKIN GRAFT AND WOUND VAC;  Surgeon: Nadara Mustard, MD;  Location: [facility] OR;  Service: Orthopedics;  Laterality: Right;   STUMP REVISION Left 12/02/2020   Procedure: REVISION LEFT BELOW KNEE AMPUTATION;  Surgeon: Nadara Mustard, MD;  Location: [facility] OR;  Service: Orthopedics;  Laterality: Left;   TEE WITHOUT CARDIOVERSION N/A 05/02/2017   Procedure: TRANSESOPHAGEAL ECHOCARDIOGRAM (TEE);  Surgeon: Kristeen Miss  J, MD;  Location: [facility] OR;  Service: Cardiovascular;  Laterality: N/A;  coincidental to orthopedic case     A IV Location/Drains/Wounds Patient Lines/Drains/Airways Status     Active Line/Drains/Airways     Name Placement date Placement time Site Days   Peripheral IV 04/03/22 20 G 1.88" Anterior;Right Forearm 04/03/22  1843  Forearm  152   Peripheral IV 09/02/22 20 G 1.88" Anterior;Right Forearm 09/02/22  1518  Forearm  less than 1   Fistula / Graft Left Upper arm Arteriovenous fistula 03/14/22  1040  Upper arm  172   Hemodialysis Catheter Right Internal jugular Double lumen Permanent (Tunneled) 03/12/22  0910  Internal jugular  174   Wound / Incision (Open or Dehisced) 10/09/21 Other (Comment) Knee Anterior;Left shallow crater on stump 10/09/21  1347  Knee  328   Wound / Incision (Open or Dehisced) 10/09/21 Other (Comment) Knee Anterior;Left shallow open area on underside of stump right 10/09/21  1348  Knee  328   Wound / Incision (Open or Dehisced) 10/09/21 Irritant Dermatitis (Moisture Associated  Skin Damage) Scrotum Left open area left side of scrotum 10/09/21  1349  Scrotum  328   Wound / Incision (Open or Dehisced) 12/14/21 Thigh Left;Posterior Large skin tear on posterior left thigh 12/14/21  2130  Thigh  262   Wound / Incision (Open or Dehisced) 12/14/21 Skin tear Thigh Left;Posterior;Proximal Skin tear 12/14/21  2130  Thigh  262   Wound / Incision (Open or Dehisced) 03/14/22 Non-pressure wound Buttocks Right 03/14/22  2256  Buttocks  172            Intake/Output Last 24 hours No intake or output data in the 24 hours ending 09/02/22 1646  Labs/Imaging Results for orders placed or performed during the hospital encounter of 09/02/22 (from the past 48 hour(s))  CBC with Differential     Status: Abnormal   Collection Time: 09/02/22 12:04 PM  Result Value Ref Range   WBC 7.1 4.0 - 10.5 K/uL   RBC 3.12 (L) 4.22 - 5.81 MIL/uL   Hemoglobin 9.8 (L) 13.0 - 17.0 g/dL   HCT 13.0 (L) 86.5 - 78.4 %   MCV 102.6 (H) 80.0 - 100.0 fL   MCH 31.4 26.0 - 34.0 pg   MCHC 30.6 30.0 - 36.0 g/dL   RDW 69.6 (H) 29.5 - 28.4 %   Platelets 204 150 - 400 K/uL   nRBC 0.0 0.0 - 0.2 %   Neutrophils Relative % 68 %   Neutro Abs 4.8 1.7 - 7.7 K/uL   Lymphocytes Relative 22 %   Lymphs Abs 1.6 0.7 - 4.0 K/uL   Monocytes Relative 6 %   Monocytes Absolute 0.4 0.1 - 1.0 K/uL   Eosinophils Relative 3 %   Eosinophils Absolute 0.2 0.0 - 0.5 K/uL   Basophils Relative 1 %   Basophils Absolute 0.1 0.0 - 0.1 K/uL   Immature Granulocytes 0 %   Abs Immature Granulocytes 0.02 0.00 - 0.07 K/uL    Comment: Performed at [facility], [address], [place], [place] [address]  Comprehensive metabolic panel     Status: Abnormal   Collection Time: 09/02/22 12:04 PM  Result Value Ref Range   Sodium 138 135 - 145 mmol/L   Potassium 6.1 (H) 3.5 - 5.1 mmol/L   Chloride 104 98 - 111 mmol/L   CO2 23 22 - 32 mmol/L   Glucose, Bld 157 (H) 70 - 99 mg/dL    Comment: Glucose reference  range applies only to samples  taken after fasting for at least 8 hours.   BUN 63 (H) 6 - 20 mg/dL   Creatinine, Ser 5.40 (H) 0.61 - 1.24 mg/dL   Calcium 9.1 8.9 - 98.1 mg/dL   Total Protein 7.6 6.5 - 8.1 g/dL   Albumin 3.9 3.5 - 5.0 g/dL   AST 33 15 - 41 U/L   ALT 34 0 - 44 U/L   Alkaline Phosphatase 72 38 - 126 U/L   Total Bilirubin 0.9 0.3 - 1.2 mg/dL   GFR, Estimated 7 (L) >60 mL/min    Comment: (NOTE) Calculated using the CKD-EPI Creatinine Equation (2021)    Anion gap 11 5 - 15    Comment: Performed at [facility], [address], [place], [place] [address]   *Note: Due to a large number of results and/or encounters for the requested time period, some results have not been displayed. A complete set of results can be found in Results Review.   No results found.  Pending Labs Unresulted Labs (From admission, onward)     Start     Ordered   09/03/22 0500  CBC  Tomorrow morning,   R        09/02/22 1554   09/03/22 0500  Renal function panel  Tomorrow morning,   R        09/02/22 1554   09/02/22 1555  Hemoglobin A1c  (Glycemic Control (SSI)  Q 4 Hours / Glycemic Control (SSI)  AC +/- HS)  Once,   R       Comments: To assess prior glycemic control    09/02/22 1554   09/02/22 1554  Hepatitis B surface antibody,quantitative  (New Admission Hemo Labs (Hepatitis B))  ONCE - URGENT,   URGENT        09/02/22 1554   09/02/22 1554  Hepatitis B surface antigen  (New Admission Hemo Labs (Hepatitis B))  ONCE - URGENT,   URGENT        09/02/22 1554            Vitals/Pain Today's Vitals   09/02/22 1146 09/02/22 1154 09/02/22 1638  BP: 109/65    Pulse: 75    Resp: 18    Temp: (!) 97.5 F (36.4 C)  (!) 97.5 F (36.4 C)  TempSrc:   Oral  SpO2: 97%    Weight:  114.8 kg   Height:  5\' 10"  (1.778 m)   PainSc:  0-No pain     Isolation Precautions No active isolations  Medications Medications  Chlorhexidine Gluconate Cloth 2 % PADS 6 each (has no administration in time range)  calcitRIOL  (ROCALTROL) capsule 1 mcg (has no administration in time range)  Darbepoetin Alfa (ARANESP) injection 60 mcg (has no administration in time range)  acetaminophen (TYLENOL) tablet 650 mg (has no administration in time range)    Or  acetaminophen (TYLENOL) suppository 650 mg (has no administration in time range)  zolpidem (AMBIEN) tablet 5 mg (has no administration in time range)  sorbitol 70 % solution 30 mL (has no administration in time range)  docusate sodium (ENEMEEZ) enema 283 mg (has no administration in time range)  ondansetron (ZOFRAN) tablet 4 mg (has no administration in time range)    Or  ondansetron (ZOFRAN) injection 4 mg (has no administration in time range)  camphor-menthol (SARNA) lotion 1 Application (has no administration in time range)    And  hydrOXYzine (ATARAX) tablet 25 mg (has no administration in  time range)  calcium carbonate (dosed in mg elemental calcium) suspension 500 mg of elemental calcium (has no administration in time range)  feeding supplement (NEPRO CARB STEADY) liquid 237 mL (has no administration in time range)  heparin injection 5,000 Units (has no administration in time range)  sodium chloride flush (NS) 0.9 % injection 3 mL (has no administration in time range)  hydrALAZINE (APRESOLINE) injection 5 mg (has no administration in time range)  insulin aspart (novoLOG) injection 0-6 Units (has no administration in time range)  albuterol (PROVENTIL) (2.5 MG/3ML) 0.083% nebulizer solution 3 mL (has no administration in time range)  allopurinol (ZYLOPRIM) tablet 100 mg (has no administration in time range)  aspirin EC tablet 81 mg (has no administration in time range)  atorvastatin (LIPITOR) tablet 80 mg (has no administration in time range)  bictegravir-emtricitabine-tenofovir AF (BIKTARVY) 50-200-25 MG per tablet 1 tablet (has no administration in time range)  calcium acetate (PHOSLO) capsule 1,334 mg (has no administration in time range)  clopidogrel  (PLAVIX) tablet 75 mg (has no administration in time range)  dapsone tablet 100 mg (has no administration in time range)  DULoxetine (CYMBALTA) DR capsule 60 mg (has no administration in time range)  finasteride (PROSCAR) tablet 5 mg (has no administration in time range)  insulin glargine-yfgn (SEMGLEE) injection 5 Units (has no administration in time range)  oxyCODONE (Oxy IR/ROXICODONE) immediate release tablet 10 mg (has no administration in time range)  tamsulosin (FLOMAX) capsule 0.4 mg (has no administration in time range)  calcium gluconate inj 10% (1 g) URGENT USE ONLY! (1 g Intravenous Given 09/02/22 1623)  sodium zirconium cyclosilicate (LOKELMA) packet 10 g (10 g Oral Given 09/02/22 1601)  furosemide (LASIX) injection 40 mg (40 mg Intravenous Given 09/02/22 1634)  sodium bicarbonate injection 50 mEq (50 mEq Intravenous Given 09/02/22 1632)    Mobility manual wheelchair     Focused Assessments    R Recommendations: See Admitting Provider Note  Report given to:   Additional Notes: Pt came in for problems with his fistula. His fistula keeps clogging. Surgery has been already consulted to come see him. Pt is A&Ox4 and uses a wheelchair to get around. Pt has bilateral BKA's and prosthetics for both extremities. Pt has 20 G in the R FA. His potassium was 6.1 and he just received a few medications for his potassium. Pt uses a urinal at bedside.

## 2022-09-03 ENCOUNTER — Observation Stay (HOSPITAL_COMMUNITY): Payer: 59

## 2022-09-03 DIAGNOSIS — N186 End stage renal disease: Secondary | ICD-10-CM | POA: Diagnosis not present

## 2022-09-03 DIAGNOSIS — G8929 Other chronic pain: Secondary | ICD-10-CM | POA: Diagnosis not present

## 2022-09-03 DIAGNOSIS — E782 Mixed hyperlipidemia: Secondary | ICD-10-CM

## 2022-09-03 DIAGNOSIS — I10 Essential (primary) hypertension: Secondary | ICD-10-CM

## 2022-09-03 DIAGNOSIS — T82590A Other mechanical complication of surgically created arteriovenous fistula, initial encounter: Secondary | ICD-10-CM | POA: Diagnosis not present

## 2022-09-03 DIAGNOSIS — N400 Enlarged prostate without lower urinary tract symptoms: Secondary | ICD-10-CM | POA: Diagnosis not present

## 2022-09-03 DIAGNOSIS — Z794 Long term (current) use of insulin: Secondary | ICD-10-CM

## 2022-09-03 DIAGNOSIS — Z992 Dependence on renal dialysis: Secondary | ICD-10-CM

## 2022-09-03 DIAGNOSIS — B20 Human immunodeficiency virus [HIV] disease: Secondary | ICD-10-CM

## 2022-09-03 DIAGNOSIS — E875 Hyperkalemia: Secondary | ICD-10-CM

## 2022-09-03 DIAGNOSIS — E1165 Type 2 diabetes mellitus with hyperglycemia: Secondary | ICD-10-CM

## 2022-09-03 DIAGNOSIS — I739 Peripheral vascular disease, unspecified: Secondary | ICD-10-CM

## 2022-09-03 HISTORY — PX: IR US GUIDE VASC ACCESS RIGHT: IMG2390

## 2022-09-03 HISTORY — PX: IR FLUORO GUIDE CV LINE RIGHT: IMG2283

## 2022-09-03 LAB — RENAL FUNCTION PANEL
Albumin: 3.6 g/dL (ref 3.5–5.0)
Anion gap: 14 (ref 5–15)
BUN: 65 mg/dL — ABNORMAL HIGH (ref 6–20)
CO2: 22 mmol/L (ref 22–32)
Calcium: 8.9 mg/dL (ref 8.9–10.3)
Chloride: 101 mmol/L (ref 98–111)
Creatinine, Ser: 8.47 mg/dL — ABNORMAL HIGH (ref 0.61–1.24)
GFR, Estimated: 7 mL/min — ABNORMAL LOW (ref >60)
Glucose, Bld: 94 mg/dL (ref 70–99)
Phosphorus: 5.7 mg/dL — ABNORMAL HIGH (ref 2.5–4.6)
Potassium: 5.6 mmol/L — ABNORMAL HIGH (ref 3.5–5.1)
Sodium: 137 mmol/L (ref 135–145)

## 2022-09-03 LAB — CBC
HCT: 30 % — ABNORMAL LOW (ref 39.0–52.0)
Hemoglobin: 9.3 g/dL — ABNORMAL LOW (ref 13.0–17.0)
MCH: 31 pg (ref 26.0–34.0)
MCHC: 31 g/dL (ref 30.0–36.0)
MCV: 100 fL (ref 80.0–100.0)
Platelets: 193 10*3/uL (ref 150–400)
RBC: 3 MIL/uL — ABNORMAL LOW (ref 4.22–5.81)
RDW: 15.8 % — ABNORMAL HIGH (ref 11.5–15.5)
WBC: 5.8 10*3/uL (ref 4.0–10.5)
nRBC: 0 % (ref 0.0–0.2)

## 2022-09-03 LAB — HEMOGLOBIN A1C
Hgb A1c MFr Bld: 5.6 % (ref 4.8–5.6)
Mean Plasma Glucose: 114 mg/dL

## 2022-09-03 LAB — MRSA NEXT GEN BY PCR, NASAL: MRSA by PCR Next Gen: DETECTED — AB

## 2022-09-03 LAB — GLUCOSE, CAPILLARY
Glucose-Capillary: 69 mg/dL — ABNORMAL LOW (ref 70–99)
Glucose-Capillary: 121 mg/dL — ABNORMAL HIGH (ref 70–99)
Glucose-Capillary: 117 mg/dL — ABNORMAL HIGH (ref 70–99)

## 2022-09-03 MED ORDER — LIDOCAINE-EPINEPHRINE 1 %-1:100000 IJ SOLN
INTRAMUSCULAR | Status: AC
  Filled 2022-09-03: qty 1

## 2022-09-03 MED ORDER — HEPARIN SODIUM (PORCINE) 1000 UNIT/ML IJ SOLN
INTRAMUSCULAR | Status: AC
  Filled 2022-09-03: qty 10

## 2022-09-03 MED ORDER — DEXTROSE 50 % IV SOLN
INTRAVENOUS | Status: AC
  Administered 2022-09-03: 50 mL
  Filled 2022-09-03: qty 50

## 2022-09-03 MED ORDER — DARBEPOETIN ALFA 60 MCG/0.3ML IJ SOSY
60.0000 ug | PREFILLED_SYRINGE | INTRAMUSCULAR | Status: DC
  Administered 2022-09-03: 60 ug via SUBCUTANEOUS
  Filled 2022-09-03: qty 0.3

## 2022-09-03 MED ORDER — MUPIROCIN 2 % EX OINT
1.0000 | TOPICAL_OINTMENT | Freq: Two times a day (BID) | CUTANEOUS | Status: DC
  Administered 2022-09-03 – 2022-09-05 (×6): 1 via NASAL
  Filled 2022-09-03 (×3): qty 22

## 2022-09-03 MED ORDER — HEPARIN SODIUM (PORCINE) 1000 UNIT/ML DIALYSIS
6000.0000 [IU] | Freq: Once | INTRAMUSCULAR | Status: DC
  Filled 2022-09-03: qty 6

## 2022-09-03 MED ORDER — CHLORHEXIDINE GLUCONATE CLOTH 2 % EX PADS
6.0000 | MEDICATED_PAD | Freq: Every day | CUTANEOUS | Status: DC
  Administered 2022-09-03: 6 via TOPICAL

## 2022-09-03 MED ORDER — HEPARIN SODIUM (PORCINE) 1000 UNIT/ML DIALYSIS
3000.0000 [IU] | INTRAMUSCULAR | Status: AC | PRN
  Administered 2022-09-03: 3000 [IU] via INTRAVENOUS_CENTRAL
  Filled 2022-09-03: qty 3

## 2022-09-03 NOTE — Procedures (Signed)
Vascular and Interventional Radiology Procedure Note  Patient: Keith [name] DOB: Nov 29, 1965 Medical Record Number: [id number] Note Date/Time: 09/03/22 8:59 AM   Performing Physician: Roanna Banning, MD Assistant(s): None  Diagnosis: ESRD requiring Hemodialysis and malfunctioning LUE AVF  Procedure: TUNNELED HEMODIALYSIS CATHETER PLACEMENT  Anesthesia: Local Anesthetic Complications: None Estimated Blood Loss: Minimal Specimens:  None  Findings:  Successful placement of right-sided, 23 cm (tip-to-cuff), tunneled hemodialysis catheter with the tip of the catheter in the proximal right atrium.  Plan: Catheter ready for use.  See detailed procedure note with images in PACS. The patient tolerated the procedure well without incident or complication and was returned to Floor Bed in stable condition.    Roanna Banning, MD Vascular [facility] Interventional Radiology [facility] Radiology   Pager. [phone] Clinic. [phone]

## 2022-09-03 NOTE — Progress Notes (Signed)
Pt successfully cannulated AVF with 15g needles retrograde. Arterial down venous up.

## 2022-09-03 NOTE — Progress Notes (Signed)
[facility] Progress Note   Subjective:    Seen and examined patient at bedside. S/p R TDC placement by IR today. Noted TDC dressing came off. I alerted the bedside charge nurse and dressing was re-applied. L AVF still with B/T and appears swelling is improving. Plan for HD this afternoon. K+ now is 5.6.  Objective Vitals:   09/02/22 2052 09/03/22 0022 09/03/22 0542 09/03/22 0910  BP: 119/71 (!) 148/79 (!) 140/56 114/64  Pulse: 65 65 72 96  Resp: 17 16 18 18   Temp: 97.6 F (36.4 C) 97.7 F (36.5 C) 97.6 F (36.4 C) 98.2 F (36.8 C)  TempSrc: Oral Oral Oral Oral  SpO2: 100% 97% 98% 99%  Weight:      Height:       Physical Exam General: Awake, alert, on RA, NAD Heart:S1 and S2; No murmurs, gallops, or rubs Lungs: Clear bilaterally; No wheezing, rales, or rhonchi Abdomen: Soft and non-tender Extremities: R BKA; L AKA Dialysis Access: R TDC (placed today); L AVF (+) B/T-swelling is improving   [facility]   09/02/22 1154 09/02/22 1725  Weight: 114.8 kg 124.2 kg    Intake/Output Summary (Last 24 hours) at 09/03/2022 1114 Last data filed at 09/03/2022 1191 Gross per 24 hour  Intake 60 ml  Output 1250 ml  Net -1190 ml    Additional Objective Labs: Basic Metabolic Panel: Recent Labs  Lab 09/02/22 1204 09/03/22 0056  NA 138 137  K 6.1* 5.6*  CL 104 101  CO2 23 22  GLUCOSE 157* 94  BUN 63* 65*  CREATININE 8.07* 8.47*  CALCIUM 9.1 8.9  PHOS  --  5.7*   Liver Function Tests: Recent Labs  Lab 09/02/22 1204 09/03/22 0056  AST 33  --   ALT 34  --   ALKPHOS 72  --   BILITOT 0.9  --   PROT 7.6  --   ALBUMIN 3.9 3.6   No results for input(s): "LIPASE", "AMYLASE" in the last 168 hours. CBC: Recent Labs  Lab 09/02/22 1204 09/03/22 0056  WBC 7.1 5.8  NEUTROABS 4.8  --   HGB 9.8* 9.3*  HCT 32.0* 30.0*  MCV 102.6* 100.0  PLT 204 193   Blood Culture    Component Value Date/Time   SDES  03/09/2022 1705    BLOOD RIGHT HAND Performed at  [facility], [address] [place], [place] [address]    SPECREQUEST  03/09/2022 1705    BOTTLES DRAWN AEROBIC AND ANAEROBIC Blood Culture adequate volume Performed at [facility], [address] [place], [place] [address]    CULT  03/09/2022 1705    NO GROWTH 5 DAYS Performed at [facility], [address] [place], [place] [address]    REPTSTATUS 03/14/2022 FINAL 03/09/2022 1705    Cardiac Enzymes: No results for input(s): "CKTOTAL", "CKMB", "CKMBINDEX", "TROPONINI" in the last 168 hours. CBG: Recent Labs  Lab 09/02/22 1740 09/02/22 2054 09/03/22 0717  GLUCAP 83 123* 69*   Iron Studies: No results for input(s): "IRON", "TIBC", "TRANSFERRIN", "FERRITIN" in the last 72 hours. Lab Results  Component Value Date   INR 1.0 04/25/2020   INR 1.0 03/21/2020   INR 1.16 04/28/2017   Studies/Results: IR Fluoro Guide CV Line Right  Result Date: 09/03/2022 INDICATION: 201257 ESRD (end stage renal disease) (HCC) 201257 Malfunctioning LEFT upper extremity AV fistula. EXAM: TUNNELED CENTRAL VENOUS HEMODIALYSIS CATHETER PLACEMENT WITH ULTRASOUND AND FLUOROSCOPIC GUIDANCE MEDICATIONS: None ANESTHESIA/SEDATION: Local anesthetic was  administered. FLUOROSCOPY TIME:  Fluoroscopic dose; 6 mGy COMPLICATIONS: None immediate. PROCEDURE: Informed written consent was obtained from the patient after a discussion of the risks, benefits, and alternatives to treatment. Questions regarding the procedure were encouraged and answered. The RIGHT neck and chest were prepped with chlorhexidine in a sterile fashion, and a sterile drape was applied covering the operative field. Maximum barrier sterile technique with sterile gowns and gloves were used for the procedure. A timeout was performed prior to the initiation of the procedure. After creating a small venotomy incision, a micropuncture kit was utilized to access the internal jugular vein. Real-time ultrasound guidance was utilized  for vascular access including the acquisition of a permanent ultrasound image documenting patency of the accessed vessel. The microwire was utilized to measure appropriate catheter length. A stiff Glidewire was advanced to the level of the IVC and the micropuncture sheath was exchanged for a peel-away sheath. A palindrome tunneled hemodialysis catheter measuring 23 cm from tip to cuff was tunneled in a retrograde fashion from the anterior chest wall to the venotomy incision. The catheter was then placed through the peel-away sheath with tips ultimately positioned within the superior aspect of the right atrium. Final catheter positioning was confirmed and documented with a spot radiographic image. The catheter aspirates and flushes normally. The catheter was flushed with appropriate volume heparin dwells. The catheter exit site was secured with a 0-Prolene retention suture. The venotomy incision was closed with Dermabond. Dressings were applied. The patient tolerated the procedure well without immediate post procedural complication. IMPRESSION: Successful placement of 23 cm tip to cuff tunneled hemodialysis catheter via the RIGHT internal jugular vein The tip of the catheter is positioned at the superior cavo-atrial junction. The catheter is ready for immediate use. Roanna Banning, MD [facility] Radiology Electronically Signed   By: Roanna Banning M.D.   On: 09/03/2022 09:03   IR US Guide Vasc Access Right  Result Date: 09/03/2022 INDICATION: 201257 ESRD (end stage renal disease) (HCC) 201257 Malfunctioning LEFT upper extremity AV fistula. EXAM: TUNNELED CENTRAL VENOUS HEMODIALYSIS CATHETER PLACEMENT WITH ULTRASOUND AND FLUOROSCOPIC GUIDANCE MEDICATIONS: None ANESTHESIA/SEDATION: Local anesthetic was administered. FLUOROSCOPY TIME:  Fluoroscopic dose; 6 mGy COMPLICATIONS: None immediate. PROCEDURE: Informed written consent was obtained from the patient after a  discussion of the risks, benefits, and alternatives to treatment. Questions regarding the procedure were encouraged and answered. The RIGHT neck and chest were prepped with chlorhexidine in a sterile fashion, and a sterile drape was applied covering the operative field. Maximum barrier sterile technique with sterile gowns and gloves were used for the procedure. A timeout was performed prior to the initiation of the procedure. After creating a small venotomy incision, a micropuncture kit was utilized to access the internal jugular vein. Real-time ultrasound guidance was utilized for vascular access including the acquisition of a permanent ultrasound image documenting patency of the accessed vessel. The microwire was utilized to measure appropriate catheter length. A stiff Glidewire was advanced to the level of the IVC and the micropuncture sheath was exchanged for a peel-away sheath. A palindrome tunneled hemodialysis catheter measuring 23 cm from tip to cuff was tunneled in a retrograde fashion from the anterior chest wall to the venotomy incision. The catheter was then placed through the peel-away sheath with tips ultimately positioned within the superior aspect of the right atrium. Final catheter positioning was confirmed and documented with a spot radiographic image. The catheter aspirates and flushes normally. The catheter was flushed with appropriate volume  heparin dwells. The catheter exit site was secured with a 0-Prolene retention suture. The venotomy incision was closed with Dermabond. Dressings were applied. The patient tolerated the procedure well without immediate post procedural complication. IMPRESSION: Successful placement of 23 cm tip to cuff tunneled hemodialysis catheter via the RIGHT internal jugular vein The tip of the catheter is positioned at the superior cavo-atrial junction. The catheter is ready for immediate use. Roanna Banning, MD [facility]  Radiology Electronically Signed   By: Roanna Banning M.D.   On: 09/03/2022 09:03    Medications:   allopurinol  100 mg Oral Daily   aspirin EC  81 mg Oral Daily   atorvastatin  80 mg Oral Daily   bictegravir-emtricitabine-tenofovir AF  1 tablet Oral Q supper   calcitRIOL  1 mcg Oral Q T,Th,Sat-1800   calcium acetate  1,334 mg Oral TID WC   Chlorhexidine Gluconate Cloth  6 each Topical Q0600   Chlorhexidine Gluconate Cloth  6 each Topical Q0600   clopidogrel  75 mg Oral Daily   dapsone  100 mg Oral Daily   darbepoetin (ARANESP) injection - DIALYSIS  60 mcg Subcutaneous Q Mon-1800   DULoxetine  60 mg Oral QPM   finasteride  5 mg Oral QPM   heparin  5,000 Units Subcutaneous Q8H   heparin  6,000 Units Dialysis Once in dialysis   insulin aspart  0-6 Units Subcutaneous TID WC   insulin glargine-yfgn  5 Units Subcutaneous QPM   mupirocin ointment  1 Application Nasal BID   sodium chloride flush  3 mL Intravenous Q12H   sodium zirconium cyclosilicate  10 g Oral BID   tamsulosin  0.4 mg Oral Daily    Dialysis Orders: [facility]  4h  350/1.5  120.5kg  2/2.5 bath  L AVF   Hep 6000+ 4000 midrun - last HD 5/23, post wt 119.4kg - rocaltrol 1.0 mcg po tiw - venofer 50mg  weekly IV - mircera 75 mcg IV q 2 wks, last 5/14, due 5/28  Assessment/Plan: Malfunctioning L AVF - placed Dec 2023. HD RN unable to cannulate overnight. IR consulted. S/p new TDC placement today by IR. L AVF still with good bruit and thrill and swelling appears to be improving. Plan to advise [facility] to rest AVF this week in outpatient. If outpatient still has problems with cannulating, will refer to [facility] outpatient for F/gram and possible intervention. Hyperkalemia - given IV Ca/ bicarb and po lokelma in ED. K+ from 6.1 to now 5.6. Plan for HD this afternoon. He is on Lokelma 10GM PO BID here. ESRD - on HD TTS. Hx as above. Plan as above.  HTN/ volume - no vol excess on exam, on RA.  Anemia esrd - Hb 9.3. Darbe 75 mcg SQ weekly while  here, will ensure he gets since HD is today MBD ckd - CCa in range. Cont po vdra.  PAD - hx bilat LE amputation IDDM  HIV - on ART Depression  Dispo - Discussed with both Primary and Dr. Arlean Hopping. Okay for patient to return back to SNF after dialysis today. If this cannot get done, he can be discharged tomorrow morning. Plan for [place] to monitor his AVF closely for this week and will refer to outpatient [facility]VS if problems with cannulation re-occur.  Salome Holmes, NP [facility] 09/03/2022,11:14 AM  LOS: 0 days

## 2022-09-03 NOTE — Hospital Course (Signed)
Keith [name] is a 57 y.o. male with past medical history of hypertension, HIV, diabetes mellitus type 2, chronic pain, end-stage renal disease on hemodialysis,  BPH, and PVD s/p L AKA and R BKA presented to the hospital with fistula malfunction.  Patient was last admitted from 12/2-11 with Salmonella and EPEC gastroenteritis and transitioned from stage 4 CKD to ESRD and was started on HD.  In the ED, patient was noted to be hyperkalemic with peaked T waves and was given Lokelma.  Nephrology and IR consulted for fistula malfunction as patient had missed hemodialysis x 2.   Assessment and Plan:   Fistula malfunction Patient with left upper extremity fistula malfunction and unable to dialyze since 523.  Nephrology and IR has been consulted.  Plan for dialysis catheter placement at this time.He may need a vascular surgery consult, should the fistula not be accessible   Hyperkalemia With T wave changes.  Received insulin glucose Lokelma and calcium gluconate.  Continue hemodialysis when able.  HIV Patient has been followed by Dr. Thedore Mins, infectious disease.  Last known viral load was 8540 and CD4 89.  Continue Biktarvy, dapsone.  Will need to follow-up with ID as outpatient.   ESRD on Tuesday Thursday Saturday hemodialysis. Missed hemodialysis due to fistula malfunction.  For hemodialysis catheter placement.   DM type II. Last known hemoglobin A1c was 11.5 in 12/2021.  Continue to hold Trulicity.  Continue glargine and sliding scale insulin.  Essential hypertension. Continue to monitor closely.   PVD, L AKA, R BKA Continue ASA, Plavix, Lipitor   Chronic pain Continue duloxetine, escitalopram oxycodone  Gout -Continue allopurinol   BPH -Continue finasteride, tamsulosin   Obesity -Body mass index is 36.3 kg/m.Marland Kitchen  Patient would benefit from ongoing weight loss as outpatient.

## 2022-09-03 NOTE — Progress Notes (Signed)
Pt receives out-pt HD at [facility] on TTS. Will assist as needed.   Brodin Gelpi Renal Navigator [phone] 

## 2022-09-03 NOTE — Progress Notes (Addendum)
Pt catheter arterial isnt pulling or pushing, venous not pulling hard to push. Dr. Arlean Hopping notified

## 2022-09-03 NOTE — TOC Progression Note (Signed)
Transition of Care [facility]) - Progression Note    Patient Details  Name: Keith [name] MRN: [id number] Date of Birth: 11/19/1965  [facility] CM/SW Contact  Erin Sons, [place] Phone Number: 09/03/2022, 12:12 PM  Clinical Narrative:     CSW confirmed with [facility] that pt can readmit to SNF tomorrow.                  Social Determinants of Health (SDOH) Interventions SDOH Screenings   Food Insecurity: No Food Insecurity (12/29/2021)  Housing: Low Risk  (12/29/2021)  Transportation Needs: No Transportation Needs (12/29/2021)  Utilities: Not At Risk (12/29/2021)  Depression (PHQ2-9): Low Risk  (08/17/2021)  Financial Resource Strain: Low Risk  (01/10/2021)  Stress: No Stress Concern Present (01/10/2021)  Tobacco Use: Low Risk  (09/03/2022)    Readmission Risk Interventions    03/13/2022    4:18 PM 01/01/2022   11:54 AM 10/10/2021    1:31 PM  Readmission Risk Prevention Plan  Transportation Screening Complete Complete Complete  Medication Review (RN Care Manager) Referral to Pharmacy Referral to Pharmacy Complete  PCP or Specialist appointment within 3-5 days of discharge  Complete Complete  HRI or Home Care Consult Complete Complete Complete  SW Recovery Care/Counseling Consult Complete Complete Complete  Palliative Care Screening Complete Not Applicable Not Applicable  Skilled Nursing Facility Complete Not Applicable Not Applicable

## 2022-09-03 NOTE — Care Management Obs Status (Signed)
MEDICARE OBSERVATION STATUS NOTIFICATION   Patient Details  Name: NISSIM LIAS MRN: [id number] Date of Birth: 05/27/1965   Medicare Observation Status Notification Given:  Yes    Tom-Johnson, Hershal Coria, RN 09/03/2022, 1:07 PM

## 2022-09-03 NOTE — Progress Notes (Signed)
   09/03/22 1917  Vitals  Temp 97.6 F (36.4 C)  Temp Source Oral  BP 106/68  BP Location Right Arm  BP Method Automatic  Patient Position (if appropriate) Lying  Pulse Rate 75  Pulse Rate Source Monitor  Resp (!) 21  During Treatment Monitoring  Intra-Hemodialysis Comments Tx completed  Post Treatment  Dialyzer Clearance Lightly streaked  Duration of HD Treatment -hour(s) 3.5 hour(s)  Hemodialysis Intake (mL) 0 mL  Liters Processed 73.6  Fluid Removed (mL) 2000 mL  Tolerated HD Treatment Yes  AVG/AVF Arterial Site Held (minutes) 10 minutes  AVG/AVF Venous Site Held (minutes) 10 minutes  Fistula / Graft Left Upper arm Arteriovenous fistula  Placement Date/Time: 03/14/22 1040   Placed prior to admission: No  Orientation: Left  Access Location: Upper arm  Access Type: Arteriovenous fistula  Site Condition No complications;Red/inflamed  Fistula / Graft Assessment Thrill;Bruit  Status Deaccessed  Drainage Description None   Larene Pickett Kajal Scalici

## 2022-09-03 NOTE — Progress Notes (Signed)
PROGRESS NOTE    Keith [name]  ZOX:[id number] DOB: Aug 18, 1965 DOA: 09/02/2022 PCP: Barbette Merino, NP    Brief Narrative:  Keith [name] is a 57 y.o. male with past medical history of hypertension, HIV, diabetes mellitus type 2, chronic pain, end-stage renal disease on hemodialysis,  BPH, and PVD s/p L AKA and R BKA presented to the hospital with fistula malfunction.  Patient was last admitted from 12/2-11 with Salmonella and EPEC gastroenteritis and transitioned from stage 4 CKD to ESRD and was started on HD.  In the ED, patient was noted to be hyperkalemic with peaked T waves and was given Lokelma.  Nephrology and IR consulted for fistula malfunction as patient had missed hemodialysis x 2.  Patient was then admitted hospital for further evaluation and treatment.  Assessment and Plan:   Fistula malfunction Patient with left upper extremity fistula malfunction and unable to dialyze since 5/23.  Nephrology and IR has been consulted.  Status post right chest wall dialysis catheter placement at this time.communicated with nephrology.  Patient will receive hemodialysis today.    Hyperkalemia With T wave changes.  Received insulin glucose Lokelma and calcium gluconate initially..  Continue hemodialysis as per nephrology.  HIV Patient has been followed by Dr. Thedore Mins, infectious disease.  Last known viral load was 8540 and CD4 89.  Continue Biktarvy, dapsone.  Will need to follow-up with ID as outpatient.   ESRD on Tuesday Thursday Saturday hemodialysis. Missed hemodialysis due to fistula malfunction.  Status post hemodialysis catheter placement.  Plan for hemodialysis today.   DM type II. Last known hemoglobin A1c was 11.5 in 12/2021.  Continue to hold Trulicity.  Continue glargine and sliding scale insulin.  Essential hypertension. Continue to monitor closely.   PVD, L AKA, R BKA Continue ASA, Plavix, Lipitor   Chronic pain Continue duloxetine, escitalopram  oxycodone  Gout -Continue allopurinol   BPH -Continue finasteride, tamsulosin   Obesity -Body mass index is 36.3 kg/m.  Patient would benefit from ongoing weight loss as outpatient.   Debility weakness deconditioning.  Patient is from [facility].  Has prosthesis in place.    DVT prophylaxis: heparin injection 5,000 Units Start: 09/02/22 1600   Code Status:     Code Status: Full Code  Disposition: [facility] likely on 09/04/2022  Status is: Observation  The patient will require care spanning > 2 midnights and should be moved to inpatient because: Need for hemodialysis, patient from skilled nursing facility.   Family Communication: None at present  Consultants:  Nephrology Interventional radiology  Procedures:  Right chest wall hemodialysis catheter placement Hemodialysis  Antimicrobials:  Biktarvy Dapsone  Anti-infectives (From admission, onward)    Start     Dose/Rate Route Frequency Ordered Stop   09/03/22 1000  dapsone tablet 100 mg        100 mg Oral Daily 09/02/22 1640     09/02/22 1700  bictegravir-emtricitabine-tenofovir AF (BIKTARVY) 50-200-25 MG per tablet 1 tablet       Note to Pharmacy: Try to take at the same time each day with or without food.     1 tablet Oral Daily with supper 09/02/22 1640        Subjective: Today, patient was seen and examined at bedside.  Status post hemodialysis catheter placement.  Awaiting for hemodialysis.  Denies shortness of breath, chest pain, headache, nausea or vomiting.  Objective: Vitals:   09/02/22 2052 09/03/22 0022 09/03/22 0542 09/03/22 0910  BP: 119/71 (!) 148/79 (!) 140/56 114/64  Pulse: 65 65 72 96  Resp: 17 16 18 18   Temp: 97.6 F (36.4 C) 97.7 F (36.5 C) 97.6 F (36.4 C) 98.2 F (36.8 C)  TempSrc: Oral Oral Oral Oral  SpO2: 100% 97% 98% 99%  Weight:      Height:        Intake/Output Summary (Last 24 hours) at 09/03/2022 1319 Last data filed at 09/03/2022 1610 Gross per 24  hour  Intake 60 ml  Output 1250 ml  Net -1190 ml   Filed Weights   09/02/22 1154 09/02/22 1725  Weight: 114.8 kg 124.2 kg    Physical Examination: Body mass index is 39.29 kg/m.  General: Obese built, not in obvious distress HENT:   No scleral pallor or icterus noted. Oral mucosa is moist.  Chest:  Clear breath sounds.  Diminished breath sounds bilaterally. No crackles or wheezes.  Right chest wall hemodialysis catheter in place. CVS: S1 &S2 heard. No murmur.  Regular rate and rhythm. Abdomen: Soft, nontender, nondistended.  Bowel sounds are heard.   Extremities: No cyanosis, clubbing or edema in the upper extremities.  Left upper extremity AV fistula.  Right below-knee amputation, left above-knee amputation. Psych: Alert, awake and oriented, normal mood CNS:  No cranial nerve deficits.   Skin: Warm and dry.  No rashes noted.  Data Reviewed:   CBC: Recent Labs  Lab 09/02/22 1204 09/03/22 0056  WBC 7.1 5.8  NEUTROABS 4.8  --   HGB 9.8* 9.3*  HCT 32.0* 30.0*  MCV 102.6* 100.0  PLT 204 193    Basic Metabolic Panel: Recent Labs  Lab 09/02/22 1204 09/03/22 0056  NA 138 137  K 6.1* 5.6*  CL 104 101  CO2 23 22  GLUCOSE 157* 94  BUN 63* 65*  CREATININE 8.07* 8.47*  CALCIUM 9.1 8.9  PHOS  --  5.7*    Liver Function Tests: Recent Labs  Lab 09/02/22 1204 09/03/22 0056  AST 33  --   ALT 34  --   ALKPHOS 72  --   BILITOT 0.9  --   PROT 7.6  --   ALBUMIN 3.9 3.6     Radiology Studies: IR Fluoro Guide CV Line Right  Result Date: 09/03/2022 INDICATION: 201257 ESRD (end stage renal disease) (HCC) 201257 Malfunctioning LEFT upper extremity AV fistula. EXAM: TUNNELED CENTRAL VENOUS HEMODIALYSIS CATHETER PLACEMENT WITH ULTRASOUND AND FLUOROSCOPIC GUIDANCE MEDICATIONS: None ANESTHESIA/SEDATION: Local anesthetic was administered. FLUOROSCOPY TIME:  Fluoroscopic dose; 6 mGy COMPLICATIONS: None immediate. PROCEDURE: Informed written consent was obtained from the patient  after a discussion of the risks, benefits, and alternatives to treatment. Questions regarding the procedure were encouraged and answered. The RIGHT neck and chest were prepped with chlorhexidine in a sterile fashion, and a sterile drape was applied covering the operative field. Maximum barrier sterile technique with sterile gowns and gloves were used for the procedure. A timeout was performed prior to the initiation of the procedure. After creating a small venotomy incision, a micropuncture kit was utilized to access the internal jugular vein. Real-time ultrasound guidance was utilized for vascular access including the acquisition of a permanent ultrasound image documenting patency of the accessed vessel. The microwire was utilized to measure appropriate catheter length. A stiff Glidewire was advanced to the level of the IVC and the micropuncture sheath was exchanged for a peel-away sheath. A palindrome tunneled hemodialysis catheter measuring 23 cm from tip to cuff was tunneled in a retrograde fashion from the anterior chest wall to the venotomy incision. The  catheter was then placed through the peel-away sheath with tips ultimately positioned within the superior aspect of the right atrium. Final catheter positioning was confirmed and documented with a spot radiographic image. The catheter aspirates and flushes normally. The catheter was flushed with appropriate volume heparin dwells. The catheter exit site was secured with a 0-Prolene retention suture. The venotomy incision was closed with Dermabond. Dressings were applied. The patient tolerated the procedure well without immediate post procedural complication. IMPRESSION: Successful placement of 23 cm tip to cuff tunneled hemodialysis catheter via the RIGHT internal jugular vein The tip of the catheter is positioned at the superior cavo-atrial junction. The catheter is ready for immediate use. Roanna Banning, MD [facility]  [facility] Radiology Electronically Signed   By: Roanna Banning M.D.   On: 09/03/2022 09:03   IR US Guide Vasc Access Right  Result Date: 09/03/2022 INDICATION: 201257 ESRD (end stage renal disease) (HCC) 201257 Malfunctioning LEFT upper extremity AV fistula. EXAM: TUNNELED CENTRAL VENOUS HEMODIALYSIS CATHETER PLACEMENT WITH ULTRASOUND AND FLUOROSCOPIC GUIDANCE MEDICATIONS: None ANESTHESIA/SEDATION: Local anesthetic was administered. FLUOROSCOPY TIME:  Fluoroscopic dose; 6 mGy COMPLICATIONS: None immediate. PROCEDURE: Informed written consent was obtained from the patient after a discussion of the risks, benefits, and alternatives to treatment. Questions regarding the procedure were encouraged and answered. The RIGHT neck and chest were prepped with chlorhexidine in a sterile fashion, and a sterile drape was applied covering the operative field. Maximum barrier sterile technique with sterile gowns and gloves were used for the procedure. A timeout was performed prior to the initiation of the procedure. After creating a small venotomy incision, a micropuncture kit was utilized to access the internal jugular vein. Real-time ultrasound guidance was utilized for vascular access including the acquisition of a permanent ultrasound image documenting patency of the accessed vessel. The microwire was utilized to measure appropriate catheter length. A stiff Glidewire was advanced to the level of the IVC and the micropuncture sheath was exchanged for a peel-away sheath. A palindrome tunneled hemodialysis catheter measuring 23 cm from tip to cuff was tunneled in a retrograde fashion from the anterior chest wall to the venotomy incision. The catheter was then placed through the peel-away sheath with tips ultimately positioned within the superior aspect of the right atrium. Final catheter positioning was confirmed and documented with a spot radiographic image. The catheter aspirates and flushes normally. The catheter was flushed  with appropriate volume heparin dwells. The catheter exit site was secured with a 0-Prolene retention suture. The venotomy incision was closed with Dermabond. Dressings were applied. The patient tolerated the procedure well without immediate post procedural complication. IMPRESSION: Successful placement of 23 cm tip to cuff tunneled hemodialysis catheter via the RIGHT internal jugular vein The tip of the catheter is positioned at the superior cavo-atrial junction. The catheter is ready for immediate use. Roanna Banning, MD [facility] Radiology Electronically Signed   By: Roanna Banning M.D.   On: 09/03/2022 09:03      LOS: 0 days     Joycelyn Das, MD [facility] Available via Epic secure chat 7am-7pm After these hours, please refer to coverage provider listed on [email or web address] 09/03/2022, 1:19 PM

## 2022-09-03 NOTE — Inpatient Diabetes Management (Signed)
Inpatient Diabetes Program Recommendations  AACE/ADA: New Consensus Statement on Inpatient Glycemic Control (2015)  Target Ranges:  Prepandial:   less than 140 mg/dL      Peak postprandial:   less than 180 mg/dL (1-2 hours)      Critically ill patients:  140 - 180 mg/dL   Lab Results  Component Value Date   GLUCAP 69 (L) 09/03/2022   HGBA1C 11.5 (H) 12/08/2021    Review of Glycemic Control  Latest Reference Range & Units 09/02/22 17:40 09/02/22 20:54 09/03/22 07:17  Glucose-Capillary 70 - 99 mg/dL 83 161 (H) 69 (L)   Diabetes history: DM 2 Outpatient Diabetes medications: Humalog 10 units Qam, lantus 5-10 units qevening, Trulicity 0.75 mg weekly Current orders for Inpatient glycemic control:  Novolog 0-6 units tid Semglee 5 units qevening  Note: mild hypoglycemia after 5 units of semglee given last night  Inpatient Diabetes Program Recommendations:    -  Reduce Semglee to 4 units  Thanks,  Christena Deem RN, MSN, BC-ADM Inpatient Diabetes Coordinator Team Pager [phone] (8a-5p)

## 2022-09-03 NOTE — Progress Notes (Signed)
Received patient in bed to unit.  Alert and oriented.  Informed consent signed and in chart. yes  TX duration:3.5  Patient tolerated well. yes Transported back to the room yes Alert, without acute distress. yes Hand-off given to patient's nurse. [employer] RN  Access used: Left upper arm fistula Access issues: difficulty with cannulation  Total UF removed: Medication(s) given: calcitrol Post HD VS: 107/83 HR 77 Post HD weight: 121.8   [facility]

## 2022-09-04 ENCOUNTER — Observation Stay (HOSPITAL_COMMUNITY): Payer: 59

## 2022-09-04 DIAGNOSIS — M898X9 Other specified disorders of bone, unspecified site: Secondary | ICD-10-CM | POA: Diagnosis present

## 2022-09-04 DIAGNOSIS — E669 Obesity, unspecified: Secondary | ICD-10-CM | POA: Diagnosis present

## 2022-09-04 DIAGNOSIS — T82590A Other mechanical complication of surgically created arteriovenous fistula, initial encounter: Secondary | ICD-10-CM | POA: Diagnosis present

## 2022-09-04 DIAGNOSIS — N400 Enlarged prostate without lower urinary tract symptoms: Secondary | ICD-10-CM | POA: Diagnosis not present

## 2022-09-04 DIAGNOSIS — Y712 Prosthetic and other implants, materials and accessory cardiovascular devices associated with adverse incidents: Secondary | ICD-10-CM | POA: Diagnosis present

## 2022-09-04 DIAGNOSIS — Z89511 Acquired absence of right leg below knee: Secondary | ICD-10-CM | POA: Diagnosis not present

## 2022-09-04 DIAGNOSIS — D631 Anemia in chronic kidney disease: Secondary | ICD-10-CM | POA: Diagnosis present

## 2022-09-04 DIAGNOSIS — E1151 Type 2 diabetes mellitus with diabetic peripheral angiopathy without gangrene: Secondary | ICD-10-CM | POA: Diagnosis present

## 2022-09-04 DIAGNOSIS — F32A Depression, unspecified: Secondary | ICD-10-CM | POA: Diagnosis present

## 2022-09-04 DIAGNOSIS — M109 Gout, unspecified: Secondary | ICD-10-CM | POA: Diagnosis present

## 2022-09-04 DIAGNOSIS — E1122 Type 2 diabetes mellitus with diabetic chronic kidney disease: Secondary | ICD-10-CM | POA: Diagnosis present

## 2022-09-04 DIAGNOSIS — E875 Hyperkalemia: Secondary | ICD-10-CM | POA: Diagnosis present

## 2022-09-04 DIAGNOSIS — G8929 Other chronic pain: Secondary | ICD-10-CM | POA: Diagnosis not present

## 2022-09-04 DIAGNOSIS — Z89512 Acquired absence of left leg below knee: Secondary | ICD-10-CM | POA: Diagnosis not present

## 2022-09-04 DIAGNOSIS — T82510A Breakdown (mechanical) of surgically created arteriovenous fistula, initial encounter: Secondary | ICD-10-CM | POA: Diagnosis present

## 2022-09-04 DIAGNOSIS — I2510 Atherosclerotic heart disease of native coronary artery without angina pectoris: Secondary | ICD-10-CM | POA: Diagnosis present

## 2022-09-04 DIAGNOSIS — Z89612 Acquired absence of left leg above knee: Secondary | ICD-10-CM | POA: Diagnosis not present

## 2022-09-04 DIAGNOSIS — B20 Human immunodeficiency virus [HIV] disease: Secondary | ICD-10-CM | POA: Diagnosis present

## 2022-09-04 DIAGNOSIS — Z794 Long term (current) use of insulin: Secondary | ICD-10-CM | POA: Diagnosis not present

## 2022-09-04 DIAGNOSIS — Z79899 Other long term (current) drug therapy: Secondary | ICD-10-CM | POA: Diagnosis not present

## 2022-09-04 DIAGNOSIS — Z992 Dependence on renal dialysis: Secondary | ICD-10-CM | POA: Diagnosis not present

## 2022-09-04 DIAGNOSIS — K219 Gastro-esophageal reflux disease without esophagitis: Secondary | ICD-10-CM | POA: Diagnosis present

## 2022-09-04 DIAGNOSIS — N186 End stage renal disease: Secondary | ICD-10-CM | POA: Diagnosis present

## 2022-09-04 DIAGNOSIS — E1165 Type 2 diabetes mellitus with hyperglycemia: Secondary | ICD-10-CM | POA: Diagnosis present

## 2022-09-04 DIAGNOSIS — E785 Hyperlipidemia, unspecified: Secondary | ICD-10-CM | POA: Diagnosis present

## 2022-09-04 DIAGNOSIS — I120 Hypertensive chronic kidney disease with stage 5 chronic kidney disease or end stage renal disease: Secondary | ICD-10-CM | POA: Diagnosis present

## 2022-09-04 HISTORY — PX: IR FLUORO RM 30-60 MIN: IMG2384

## 2022-09-04 LAB — BASIC METABOLIC PANEL
Anion gap: 12 (ref 5–15)
BUN: 38 mg/dL — ABNORMAL HIGH (ref 6–20)
CO2: 28 mmol/L (ref 22–32)
Calcium: 9.1 mg/dL (ref 8.9–10.3)
Chloride: 95 mmol/L — ABNORMAL LOW (ref 98–111)
Creatinine, Ser: 6.02 mg/dL — ABNORMAL HIGH (ref 0.61–1.24)
GFR, Estimated: 10 mL/min — ABNORMAL LOW (ref >60)
Glucose, Bld: 134 mg/dL — ABNORMAL HIGH (ref 70–99)
Potassium: 4.2 mmol/L (ref 3.5–5.1)
Sodium: 135 mmol/L (ref 135–145)

## 2022-09-04 LAB — CBC
HCT: 30.5 % — ABNORMAL LOW (ref 39.0–52.0)
Hemoglobin: 9.6 g/dL — ABNORMAL LOW (ref 13.0–17.0)
MCH: 30.9 pg (ref 26.0–34.0)
MCHC: 31.5 g/dL (ref 30.0–36.0)
MCV: 98.1 fL (ref 80.0–100.0)
Platelets: 179 10*3/uL (ref 150–400)
RBC: 3.11 MIL/uL — ABNORMAL LOW (ref 4.22–5.81)
RDW: 16.1 % — ABNORMAL HIGH (ref 11.5–15.5)
WBC: 7.4 10*3/uL (ref 4.0–10.5)
nRBC: 0.3 % — ABNORMAL HIGH (ref 0.0–0.2)

## 2022-09-04 LAB — GLUCOSE, CAPILLARY
Glucose-Capillary: 99 mg/dL (ref 70–99)
Glucose-Capillary: 98 mg/dL (ref 70–99)
Glucose-Capillary: 90 mg/dL (ref 70–99)
Glucose-Capillary: 159 mg/dL — ABNORMAL HIGH (ref 70–99)

## 2022-09-04 LAB — MAGNESIUM: Magnesium: 2 mg/dL (ref 1.7–2.4)

## 2022-09-04 LAB — HEPATITIS B SURFACE ANTIBODY, QUANTITATIVE: Hep B S AB Quant (Post): 16497 m[IU]/mL (ref >9.9)

## 2022-09-04 MED ORDER — LIDOCAINE-EPINEPHRINE 1 %-1:100000 IJ SOLN
INTRAMUSCULAR | Status: AC
  Filled 2022-09-04: qty 1

## 2022-09-04 MED ORDER — HEPARIN SODIUM (PORCINE) 1000 UNIT/ML IJ SOLN
INTRAMUSCULAR | Status: AC
  Filled 2022-09-04: qty 10

## 2022-09-04 MED ORDER — MIDODRINE HCL 5 MG PO TABS
10.0000 mg | ORAL_TABLET | ORAL | Status: DC
  Administered 2022-09-05: 10 mg via ORAL
  Filled 2022-09-04: qty 2

## 2022-09-04 NOTE — Progress Notes (Signed)
PROGRESS NOTE    Keith [name]  QMV:[id number] DOB: 1965-06-12 DOA: 09/02/2022 PCP: Barbette Merino, NP    Brief Narrative:   Keith [name] is a 57 y.o. male with past medical history of hypertension, HIV, diabetes mellitus type 2, chronic pain, end-stage renal disease on hemodialysis,  BPH, and PVD s/p L AKA and R BKA presented to the hospital with fistula malfunction.  Patient was last admitted from 12/2-11 with Salmonella and EPEC gastroenteritis and transitioned from stage 4 CKD to ESRD and was started on HD.  In the ED, patient was noted to be hyperkalemic with peaked T waves and was given Lokelma.  Nephrology and IR consulted for fistula malfunction as patient had missed hemodialysis x 2.  Patient was then admitted hospital for further evaluation and treatment.  Assessment and Plan:   Fistula malfunction Patient with left upper extremity fistula malfunction and unable to dialyze since 5/23.  Nephrology and IR on board.  Status post right chest wall dialysis catheter placement but not functioning so will likely need exchange today.  Nephrology on board for hemodialysis.   Hyperkalemia Present on presentation, has improved after insulin Lokelma calcium gluconate in hemodialysis.  HIV Patient has been followed by Dr. Thedore Mins, infectious disease.  Last known viral load was 8540 and CD4 89.  Continue Biktarvy, dapsone.  Will need to follow-up with ID as outpatient.   ESRD on Tuesday Thursday Saturday hemodialysis. Missed hemodialysis due to fistula malfunction.  Status post hemodialysis catheter placement.  Hemodialysis as per nephrology.   DM type II. Last known hemoglobin A1c was 11.5 in 12/2021.  Continue to hold Trulicity.  Continue glargine and sliding scale insulin.  Essential hypertension. Continue to monitor closely.   PVD, L AKA, R BKA Continue ASA, Plavix, Lipitor   Chronic pain Continue duloxetine, escitalopram oxycodone  Gout -Continue allopurinol   BPH -Continue  finasteride, tamsulosin   Obesity -Body mass index is 36.3 kg/m.  Patient would benefit from ongoing weight loss as outpatient.   Debility weakness deconditioning.  Patient is from [facility] healthcare.  Has prosthesis in place.    DVT prophylaxis: heparin injection 5,000 Units Start: 09/02/22 1600   Code Status:     Code Status: Full Code  Disposition: [facility] when okay with nephrology.    Status is: Observation  The patient will require care spanning > 2 midnights and should be moved to inpatient because: Need for hemodialysis, patient from skilled nursing facility.   Family Communication: None at present  Consultants:  Nephrology Interventional radiology  Procedures:  Right chest wall hemodialysis catheter placement Hemodialysis  Antimicrobials:  Biktarvy Dapsone  Anti-infectives (From admission, onward)    Start     Dose/Rate Route Frequency Ordered Stop   09/03/22 1000  dapsone tablet 100 mg        100 mg Oral Daily 09/02/22 1640     09/02/22 1700  bictegravir-emtricitabine-tenofovir AF (BIKTARVY) 50-200-25 MG per tablet 1 tablet       Note to Pharmacy: Try to take at the same time each day with or without food.     1 tablet Oral Daily with supper 09/02/22 1640        Subjective: Today, patient was seen and examined at bedside.  Nonfunctioning right hemodialysis catheter, currently n.p.o. for possible exchange of catheter.  Objective: Vitals:   09/03/22 1917 09/03/22 1933 09/03/22 2200 09/04/22 0914  BP: 106/68  (!) 122/37 114/68  Pulse: 75  73 72  Resp: (!) 21  18 18  Temp: 97.6 F (36.4 C)  98.3 F (36.8 C) 98.3 F (36.8 C)  TempSrc: Oral  Oral   SpO2:   99% 97%  Weight:  123.8 kg    Height:        Intake/Output Summary (Last 24 hours) at 09/04/2022 1023 Last data filed at 09/03/2022 2230 Gross per 24 hour  Intake 240 ml  Output 2400 ml  Net -2160 ml    Filed Weights   09/02/22 1725 09/03/22 1515 09/03/22 1933  Weight: 124.2  kg 125.7 kg 123.8 kg    Physical Examination: Body mass index is 39.16 kg/m.   General: Obese built, not in obvious distress awake and Communicative. HENT:   No scleral pallor or icterus noted. Oral mucosa is moist.  Chest:  Diminished breath sounds bilaterally. No crackles or wheezes.  Right chest wall hemodialysis catheter in place. CVS: S1 &S2 heard. No murmur.  Regular rate and rhythm. Abdomen: Soft, nontender, nondistended.  Bowel sounds are heard.   Extremities: No cyanosis, clubbing or edema in the upper extremities.  Left upper extremity AV fistula.  Right below-knee amputation, left above-knee amputation. Psych: Alert, awake and oriented, normal mood CNS:  No cranial nerve deficits.   Skin: Warm and dry.  No rashes noted.  Data Reviewed:   CBC: Recent Labs  Lab 09/02/22 1204 09/03/22 0056 09/04/22 0303  WBC 7.1 5.8 7.4  NEUTROABS 4.8  --   --   HGB 9.8* 9.3* 9.6*  HCT 32.0* 30.0* 30.5*  MCV 102.6* 100.0 98.1  PLT 204 193 179     Basic Metabolic Panel: Recent Labs  Lab 09/02/22 1204 09/03/22 0056 09/04/22 0303  NA 138 137 135  K 6.1* 5.6* 4.2  CL 104 101 95*  CO2 23 22 28   GLUCOSE 157* 94 134*  BUN 63* 65* 38*  CREATININE 8.07* 8.47* 6.02*  CALCIUM 9.1 8.9 9.1  MG  --   --  2.0  PHOS  --  5.7*  --      Liver Function Tests: Recent Labs  Lab 09/02/22 1204 09/03/22 0056  AST 33  --   ALT 34  --   ALKPHOS 72  --   BILITOT 0.9  --   PROT 7.6  --   ALBUMIN 3.9 3.6      Radiology Studies: IR Fluoro Guide CV Line Right  Result Date: 09/03/2022 INDICATION: 201257 ESRD (end stage renal disease) (HCC) 201257 Malfunctioning LEFT upper extremity AV fistula. EXAM: TUNNELED CENTRAL VENOUS HEMODIALYSIS CATHETER PLACEMENT WITH ULTRASOUND AND FLUOROSCOPIC GUIDANCE MEDICATIONS: None ANESTHESIA/SEDATION: Local anesthetic was administered. FLUOROSCOPY TIME:  Fluoroscopic dose; 6 mGy COMPLICATIONS: None immediate. PROCEDURE: Informed written consent was  obtained from the patient after a discussion of the risks, benefits, and alternatives to treatment. Questions regarding the procedure were encouraged and answered. The RIGHT neck and chest were prepped with chlorhexidine in a sterile fashion, and a sterile drape was applied covering the operative field. Maximum barrier sterile technique with sterile gowns and gloves were used for the procedure. A timeout was performed prior to the initiation of the procedure. After creating a small venotomy incision, a micropuncture kit was utilized to access the internal jugular vein. Real-time ultrasound guidance was utilized for vascular access including the acquisition of a permanent ultrasound image documenting patency of the accessed vessel. The microwire was utilized to measure appropriate catheter length. A stiff Glidewire was advanced to the level of the IVC and the micropuncture sheath was exchanged for a peel-away  sheath. A palindrome tunneled hemodialysis catheter measuring 23 cm from tip to cuff was tunneled in a retrograde fashion from the anterior chest wall to the venotomy incision. The catheter was then placed through the peel-away sheath with tips ultimately positioned within the superior aspect of the right atrium. Final catheter positioning was confirmed and documented with a spot radiographic image. The catheter aspirates and flushes normally. The catheter was flushed with appropriate volume heparin dwells. The catheter exit site was secured with a 0-Prolene retention suture. The venotomy incision was closed with Dermabond. Dressings were applied. The patient tolerated the procedure well without immediate post procedural complication. IMPRESSION: Successful placement of 23 cm tip to cuff tunneled hemodialysis catheter via the RIGHT internal jugular vein The tip of the catheter is positioned at the superior cavo-atrial junction. The catheter is ready for immediate use. Roanna Banning, MD [facility]  [facility] Radiology Electronically Signed   By: Roanna Banning M.D.   On: 09/03/2022 09:03   IR US Guide Vasc Access Right  Result Date: 09/03/2022 INDICATION: 201257 ESRD (end stage renal disease) (HCC) 201257 Malfunctioning LEFT upper extremity AV fistula. EXAM: TUNNELED CENTRAL VENOUS HEMODIALYSIS CATHETER PLACEMENT WITH ULTRASOUND AND FLUOROSCOPIC GUIDANCE MEDICATIONS: None ANESTHESIA/SEDATION: Local anesthetic was administered. FLUOROSCOPY TIME:  Fluoroscopic dose; 6 mGy COMPLICATIONS: None immediate. PROCEDURE: Informed written consent was obtained from the patient after a discussion of the risks, benefits, and alternatives to treatment. Questions regarding the procedure were encouraged and answered. The RIGHT neck and chest were prepped with chlorhexidine in a sterile fashion, and a sterile drape was applied covering the operative field. Maximum barrier sterile technique with sterile gowns and gloves were used for the procedure. A timeout was performed prior to the initiation of the procedure. After creating a small venotomy incision, a micropuncture kit was utilized to access the internal jugular vein. Real-time ultrasound guidance was utilized for vascular access including the acquisition of a permanent ultrasound image documenting patency of the accessed vessel. The microwire was utilized to measure appropriate catheter length. A stiff Glidewire was advanced to the level of the IVC and the micropuncture sheath was exchanged for a peel-away sheath. A palindrome tunneled hemodialysis catheter measuring 23 cm from tip to cuff was tunneled in a retrograde fashion from the anterior chest wall to the venotomy incision. The catheter was then placed through the peel-away sheath with tips ultimately positioned within the superior aspect of the right atrium. Final catheter positioning was confirmed and documented with a spot radiographic image. The catheter aspirates and flushes normally.  The catheter was flushed with appropriate volume heparin dwells. The catheter exit site was secured with a 0-Prolene retention suture. The venotomy incision was closed with Dermabond. Dressings were applied. The patient tolerated the procedure well without immediate post procedural complication. IMPRESSION: Successful placement of 23 cm tip to cuff tunneled hemodialysis catheter via the RIGHT internal jugular vein The tip of the catheter is positioned at the superior cavo-atrial junction. The catheter is ready for immediate use. Roanna Banning, MD [facility] Radiology Electronically Signed   By: Roanna Banning M.D.   On: 09/03/2022 09:03      LOS: 0 days     Joycelyn Das, MD [facility] Available via Epic secure chat 7am-7pm After these hours, please refer to coverage provider listed on [email or web address] 09/04/2022, 10:23 AM

## 2022-09-04 NOTE — TOC Progression Note (Signed)
Transition of Care [facility]) - Initial/Assessment Note    Patient Details  Name: Keith [name] MRN: [id number] Date of Birth: 05-Jan-1966  Transition of Care [facility]) CM/SW Contact:    Ralene Bathe, LCSW Phone Number: 09/04/2022, 4:13 PM  Clinical Narrative:                 LCSW spoke with Kia in admissions at [facility].  The patient can return to the facility when medically ready.  TOC following.   Patient Goals and CMS Choice            Expected Discharge Plan and Services                                              Prior Living Arrangements/Services                       Activities of Daily Living      Permission Sought/Granted                  Emotional Assessment              Admission diagnosis:  Hyperkalemia [E87.5] ESRD (end stage renal disease) (HCC) [N18.6] Complication of vascular access for dialysis, initial encounter [T82.9XXA] Dialysis AV fistula malfunction, initial encounter [facility]) [T82.590A] Dialysis AV fistula malfunction (HCC) [T82.590A] Patient Active Problem List   Diagnosis Date Noted   Dialysis AV fistula malfunction (HCC) 09/04/2022   Dialysis AV fistula malfunction, initial encounter (HCC) 09/02/2022   Hyperkalemia, diminished renal excretion 09/02/2022   ESRD on dialysis (HCC) 09/02/2022   AKI (acute kidney injury) (HCC) 03/09/2022   Gallbladder anomaly 03/09/2022   Fall at home, initial encounter 12/29/2021   Acute cystitis 12/29/2021   History of anemia due to chronic kidney disease 12/29/2021   Nonketotic hyperglycinemia, type II (HCC) 12/18/2021   Asymptomatic bacteriuria 12/18/2021   Type 2 diabetes mellitus with hyperosmolar nonketotic hyperglycemia (HCC) 12/06/2021   Hyperglycemia 12/06/2021   BPH (benign prostatic hyperplasia)    GERD (gastroesophageal reflux disease)    Headache 08/22/2021   Anxiety 02/13/2021   Healthcare maintenance 02/13/2021   History of left above knee  amputation (HCC) 02/07/2021   Wound dehiscence 01/03/2021   Coagulopathy (HCC) 12/22/2020   CAD (coronary artery disease) 12/22/2020   Obesity 12/22/2020   Ulcer of heel due to diabetes mellitus (HCC)    Anemia    Hyperglycemia due to diabetes mellitus (HCC) 08/29/2020   Type 2 diabetes mellitus with hyperlipidemia (HCC)    PAD (peripheral artery disease) (HCC)    Uncontrolled type 2 diabetes mellitus with hyperglycemia, with long-term current use of insulin with renal complication 01/14/2019   Elevated glucose 01/14/2019   Hx of BKA, right (HCC) 01/14/2019   Pressure injury of skin 11/11/2018   CKD (chronic kidney disease) stage 4, GFR 15-29 ml/min (HCC) 11/10/2018   Amputation stump infection (HCC) 10/28/2018   Type II diabetes mellitus with renal manifestations (HCC) 08/07/2018   Uncontrolled type 2 diabetes mellitus with hyperosmolar nonketotic hyperglycemia (HCC) 08/07/2018   Non-pressure chronic ulcer of right calf limited to breakdown of skin (HCC) 07/06/2018   Type 2 diabetes mellitus without complication, without long-term current use of insulin (HCC) 06/15/2018   Chronic low back pain without sciatica 06/15/2018   Idiopathic chronic venous hypertension of left lower extremity with ulcer and inflammation (  HCC)    Venous stasis ulcer of left calf limited to breakdown of skin without varicose veins (HCC) 04/23/2018   Acquired absence of right leg below knee (HCC) 06/13/2017   Diabetic polyneuropathy associated with type 2 diabetes mellitus (HCC) 01/23/2017   Diarrhea 09/28/2016   Nausea vomiting and diarrhea 09/28/2016   Onychomycosis of multiple toenails with type 2 diabetes mellitus (HCC) 08/29/2015   MRSA carrier 04/20/2014   Penile wart 02/22/2014   HIV disease (HCC)    Insulin-requiring or dependent type II diabetes mellitus (HCC) 02/04/2014   Dental anomaly 11/20/2012   Arthritis of right knee 02/23/2012   Hyperlipidemia 11/10/2011   Hyponatremia 11/10/2011   Chronic  pain 08/07/2011   Meralgia paraesthetica 04/23/2011   ERECTILE DYSFUNCTION 08/22/2008   HTN (hypertension) 05/19/2008   PCP:  Barbette Merino, NP Pharmacy:   [facility] [phone] Ginette Otto, [place] - [address] AT [facility] [address] [place] [address] Phone: [phone] Fax: [phone]  [place] [facility] [address] [place] [address] Phone: [phone] Fax: [phone]  [id number] - [place], [place] - [address] AT [facility] OF [address] [place] [address] Phone: [phone] Fax: [phone]  [facility] - [place], [place] - [address] [place] [address] Phone: [phone] Fax: [phone]     Social Determinants of Health (SDOH) Social History: SDOH Screenings   Food Insecurity: No Food Insecurity (12/29/2021)  Housing: Low Risk  (12/29/2021)  Transportation Needs: No Transportation Needs (12/29/2021)  Utilities: Not At Risk (12/29/2021)  Depression (PHQ2-9): Low Risk  (08/17/2021)  Financial Resource Strain: Low Risk  (01/10/2021)  Stress: No Stress Concern Present (01/10/2021)  Tobacco Use: Low Risk  (09/04/2022)   SDOH Interventions:     Readmission Risk Interventions    03/13/2022    4:18 PM 01/01/2022   11:54 AM 10/10/2021    1:31 PM  Readmission Risk Prevention Plan  Transportation Screening Complete Complete Complete  Medication Review (RN Care Manager) Referral to Pharmacy Referral to Pharmacy Complete  PCP or Specialist appointment within 3-5 days of discharge  Complete Complete  HRI or Home Care Consult Complete Complete Complete  SW Recovery Care/Counseling Consult Complete Complete Complete  Palliative Care Screening Complete Not Applicable Not Applicable  Skilled Nursing Facility Complete Not Applicable Not Applicable

## 2022-09-04 NOTE — Progress Notes (Signed)
[facility] Progress Note   Subjective:    Seen and examined patient at bedside. S/p TDC placement by IR 5/28. Unfortunately, the [facility] malfunctioned again at HD yesterday. We were able to successfully cannulate using his L AVF, BUT, I was informed by the HD RN that his cannulation was difficult and how the AVF was "very deep". More likely 2nd to recent infiltration. Noted net UF 2L. Patient is going to need stable access at discharge. I discussed the situation with IR. Plan for [facility] interrogation vs exchange hopefully today. Patient has been NPO.  Objective Vitals:   09/03/22 1917 09/03/22 1933 09/03/22 2200 09/04/22 0914  BP: 106/68  (!) 122/37 114/68  Pulse: 75  73 72  Resp: (!) 21  18 18   Temp: 97.6 F (36.4 C)  98.3 F (36.8 C) 98.3 F (36.8 C)  TempSrc: Oral  Oral   SpO2:   99% 97%  Weight:  123.8 kg    Height:       Physical Exam General: Awake, alert, on RA, NAD Heart:S1 and S2; No murmurs, gallops, or rubs Lungs: Diminished bilaterally ; No wheezing, rales, or rhonchi Abdomen: Soft and non-tender Extremities: R BKA; L AKA Dialysis Access: R TDC (placed today); L AVF (+) B/T-swelling is improving   [facility] Weights   09/02/22 1725 09/03/22 1515 09/03/22 1933  Weight: 124.2 kg 125.7 kg 123.8 kg    Intake/Output Summary (Last 24 hours) at 09/04/2022 1215 Last data filed at 09/03/2022 2230 Gross per 24 hour  Intake 240 ml  Output 2400 ml  Net -2160 ml    Additional Objective Labs: Basic Metabolic Panel: Recent Labs  Lab 09/02/22 1204 09/03/22 0056 09/04/22 0303  NA 138 137 135  K 6.1* 5.6* 4.2  CL 104 101 95*  CO2 23 22 28   GLUCOSE 157* 94 134*  BUN 63* 65* 38*  CREATININE 8.07* 8.47* 6.02*  CALCIUM 9.1 8.9 9.1  PHOS  --  5.7*  --    Liver Function Tests: Recent Labs  Lab 09/02/22 1204 09/03/22 0056  AST 33  --   ALT 34  --   ALKPHOS 72  --   BILITOT 0.9  --   PROT 7.6  --   ALBUMIN 3.9 3.6   No results for input(s): "LIPASE",  "AMYLASE" in the last 168 hours. CBC: Recent Labs  Lab 09/02/22 1204 09/03/22 0056 09/04/22 0303  WBC 7.1 5.8 7.4  NEUTROABS 4.8  --   --   HGB 9.8* 9.3* 9.6*  HCT 32.0* 30.0* 30.5*  MCV 102.6* 100.0 98.1  PLT 204 193 179   Blood Culture    Component Value Date/Time   SDES  03/09/2022 1705    BLOOD RIGHT HAND Performed at [facility], [address], [place], [place] [address]    SPECREQUEST  03/09/2022 1705    BOTTLES DRAWN AEROBIC AND ANAEROBIC Blood Culture adequate volume Performed at [facility], [address], [place], [place] [address]    CULT  03/09/2022 1705    NO GROWTH 5 DAYS Performed at [facility], [address], [place], [place] [address]    REPTSTATUS 03/14/2022 FINAL 03/09/2022 1705    Cardiac Enzymes: No results for input(s): "CKTOTAL", "CKMB", "CKMBINDEX", "TROPONINI" in the last 168 hours. CBG: Recent Labs  Lab 09/03/22 0717 09/03/22 1202 09/03/22 2159 09/04/22 0719 09/04/22 1117  GLUCAP 69* 121* 117* 99 98   Iron Studies: No results for input(s): "IRON", "TIBC", "TRANSFERRIN", "FERRITIN"  in the last 72 hours. Lab Results  Component Value Date   INR 1.0 04/25/2020   INR 1.0 03/21/2020   INR 1.16 04/28/2017   Studies/Results: IR Fluoro Guide CV Line Right  Result Date: 09/03/2022 INDICATION: 201257 ESRD (end stage renal disease) (HCC) 201257 Malfunctioning LEFT upper extremity AV fistula. EXAM: TUNNELED CENTRAL VENOUS HEMODIALYSIS CATHETER PLACEMENT WITH ULTRASOUND AND FLUOROSCOPIC GUIDANCE MEDICATIONS: None ANESTHESIA/SEDATION: Local anesthetic was administered. FLUOROSCOPY TIME:  Fluoroscopic dose; 6 mGy COMPLICATIONS: None immediate. PROCEDURE: Informed written consent was obtained from the patient after a discussion of the risks, benefits, and alternatives to treatment. Questions regarding the procedure were encouraged and answered. The RIGHT neck and chest were prepped with chlorhexidine in a sterile  fashion, and a sterile drape was applied covering the operative field. Maximum barrier sterile technique with sterile gowns and gloves were used for the procedure. A timeout was performed prior to the initiation of the procedure. After creating a small venotomy incision, a micropuncture kit was utilized to access the internal jugular vein. Real-time ultrasound guidance was utilized for vascular access including the acquisition of a permanent ultrasound image documenting patency of the accessed vessel. The microwire was utilized to measure appropriate catheter length. A stiff Glidewire was advanced to the level of the IVC and the micropuncture sheath was exchanged for a peel-away sheath. A palindrome tunneled hemodialysis catheter measuring 23 cm from tip to cuff was tunneled in a retrograde fashion from the anterior chest wall to the venotomy incision. The catheter was then placed through the peel-away sheath with tips ultimately positioned within the superior aspect of the right atrium. Final catheter positioning was confirmed and documented with a spot radiographic image. The catheter aspirates and flushes normally. The catheter was flushed with appropriate volume heparin dwells. The catheter exit site was secured with a 0-Prolene retention suture. The venotomy incision was closed with Dermabond. Dressings were applied. The patient tolerated the procedure well without immediate post procedural complication. IMPRESSION: Successful placement of 23 cm tip to cuff tunneled hemodialysis catheter via the RIGHT internal jugular vein The tip of the catheter is positioned at the superior cavo-atrial junction. The catheter is ready for immediate use. Roanna Banning, MD [facility] Radiology Electronically Signed   By: Roanna Banning M.D.   On: 09/03/2022 09:03   IR US Guide Vasc Access Right  Result Date: 09/03/2022 INDICATION: 201257 ESRD (end stage renal disease) (HCC)  201257 Malfunctioning LEFT upper extremity AV fistula. EXAM: TUNNELED CENTRAL VENOUS HEMODIALYSIS CATHETER PLACEMENT WITH ULTRASOUND AND FLUOROSCOPIC GUIDANCE MEDICATIONS: None ANESTHESIA/SEDATION: Local anesthetic was administered. FLUOROSCOPY TIME:  Fluoroscopic dose; 6 mGy COMPLICATIONS: None immediate. PROCEDURE: Informed written consent was obtained from the patient after a discussion of the risks, benefits, and alternatives to treatment. Questions regarding the procedure were encouraged and answered. The RIGHT neck and chest were prepped with chlorhexidine in a sterile fashion, and a sterile drape was applied covering the operative field. Maximum barrier sterile technique with sterile gowns and gloves were used for the procedure. A timeout was performed prior to the initiation of the procedure. After creating a small venotomy incision, a micropuncture kit was utilized to access the internal jugular vein. Real-time ultrasound guidance was utilized for vascular access including the acquisition of a permanent ultrasound image documenting patency of the accessed vessel. The microwire was utilized to measure appropriate catheter length. A stiff Glidewire was advanced to the level of the IVC and the micropuncture sheath was exchanged for a peel-away sheath. A  palindrome tunneled hemodialysis catheter measuring 23 cm from tip to cuff was tunneled in a retrograde fashion from the anterior chest wall to the venotomy incision. The catheter was then placed through the peel-away sheath with tips ultimately positioned within the superior aspect of the right atrium. Final catheter positioning was confirmed and documented with a spot radiographic image. The catheter aspirates and flushes normally. The catheter was flushed with appropriate volume heparin dwells. The catheter exit site was secured with a 0-Prolene retention suture. The venotomy incision was closed with Dermabond. Dressings were applied. The patient tolerated  the procedure well without immediate post procedural complication. IMPRESSION: Successful placement of 23 cm tip to cuff tunneled hemodialysis catheter via the RIGHT internal jugular vein The tip of the catheter is positioned at the superior cavo-atrial junction. The catheter is ready for immediate use. Roanna Banning, MD [facility] Specialists [facility] Radiology Electronically Signed   By: Roanna Banning M.D.   On: 09/03/2022 09:03    Medications:   allopurinol  100 mg Oral Daily   aspirin EC  81 mg Oral Daily   atorvastatin  80 mg Oral Daily   bictegravir-emtricitabine-tenofovir AF  1 tablet Oral Q supper   calcitRIOL  1 mcg Oral Q T,Th,Sat-1800   calcium acetate  1,334 mg Oral TID WC   Chlorhexidine Gluconate Cloth  6 each Topical Q0600   Chlorhexidine Gluconate Cloth  6 each Topical Q0600   clopidogrel  75 mg Oral Daily   dapsone  100 mg Oral Daily   darbepoetin (ARANESP) injection - DIALYSIS  60 mcg Subcutaneous Q Tue-1800   DULoxetine  60 mg Oral QPM   finasteride  5 mg Oral QPM   heparin  5,000 Units Subcutaneous Q8H   insulin aspart  0-6 Units Subcutaneous TID WC   insulin glargine-yfgn  5 Units Subcutaneous QPM   mupirocin ointment  1 Application Nasal BID   sodium chloride flush  3 mL Intravenous Q12H   tamsulosin  0.4 mg Oral Daily    Dialysis Orders: [facility] TTS  4h  350/1.5  120.5kg  2/2.5 bath  L AVF   Hep 6000+ 4000 midrun - last HD 5/23, post wt 119.4kg - rocaltrol 1.0 mcg po tiw - venofer 50mg  weekly IV - mircera 75 mcg IV q 2 wks, last 5/14, due 5/28  Assessment/Plan: Malfunctioning L AVF - placed Dec 2023. S/p new TDC placement 5/28 by IR. Unfortunately, TDC malfunctioned again at HD yesterday. Discussed with Dr. Milford Cage yesterday. Patient with history of multiple TDC exchanges. We were able to dialyze using AVF BUT I was informed his cannulation was difficult and how the AVF felt "very deep", more likely 2nd recent infiltration. Patient will  need stable access at discharge. I discussed situation with IR. Plan for [facility] interrogation vs exchange today. Goal is to continue to rest his L arm until the infiltration thoroughly heals and then we can try to re-cannulate in outpatient. If problems with AVF re-occur, plan to refer back to [facility]. Hyperkalemia - initiated hyperkalemia protocol at admit. He received HD yesterday. K+ now 4.2. Lokelma BID stopped for now. ESRD - on HD TTS. Hx as above. Plan as above.  HTN/ volume - no vol excess on exam, on RA.  Anemia esrd - Hb 9.6. Darbe 75 mcg SQ weekly while here, will ensure he gets since HD is today MBD ckd - CCa in range. Cont po vdra.  PAD - hx bilat LE amputation IDDM  HIV - on ART  Depression  Dispo - Patient will able to be discharged once we determine he has stable access. Scheduled to return to IR today.  Salome Holmes, NP [facility] 09/04/2022,12:15 PM  LOS: 0 days

## 2022-09-04 NOTE — Procedures (Signed)
Pre-procedure Diagnosis: ESRD; Malfunctioning HD catheter Post-procedure Diagnosis: Same  Malfunction of the recently placed dialysis catheter is presumably secondary to kinking of the external dialysis catheter tubing.  Appropriate functionality of the dialysis catheter was restored following re-dressing of the catheter.  The dialysis catheter is ready for immediate usage.  Katherina Right, MD Pager #: [phone]

## 2022-09-05 DIAGNOSIS — G8929 Other chronic pain: Secondary | ICD-10-CM | POA: Diagnosis not present

## 2022-09-05 DIAGNOSIS — N400 Enlarged prostate without lower urinary tract symptoms: Secondary | ICD-10-CM | POA: Diagnosis not present

## 2022-09-05 DIAGNOSIS — N186 End stage renal disease: Secondary | ICD-10-CM | POA: Diagnosis not present

## 2022-09-05 DIAGNOSIS — T82590A Other mechanical complication of surgically created arteriovenous fistula, initial encounter: Secondary | ICD-10-CM | POA: Diagnosis not present

## 2022-09-05 LAB — RENAL FUNCTION PANEL
Albumin: 3.6 g/dL (ref 3.5–5.0)
Anion gap: 14 (ref 5–15)
BUN: 50 mg/dL — ABNORMAL HIGH (ref 6–20)
CO2: 24 mmol/L (ref 22–32)
Calcium: 8.8 mg/dL — ABNORMAL LOW (ref 8.9–10.3)
Chloride: 96 mmol/L — ABNORMAL LOW (ref 98–111)
Creatinine, Ser: 7.7 mg/dL — ABNORMAL HIGH (ref 0.61–1.24)
GFR, Estimated: 8 mL/min — ABNORMAL LOW
Glucose, Bld: 112 mg/dL — ABNORMAL HIGH (ref 70–99)
Phosphorus: 5.5 mg/dL — ABNORMAL HIGH (ref 2.5–4.6)
Potassium: 5 mmol/L (ref 3.5–5.1)
Sodium: 134 mmol/L — ABNORMAL LOW (ref 135–145)

## 2022-09-05 LAB — CBC WITH DIFFERENTIAL/PLATELET
Abs Immature Granulocytes: 0.01 K/uL (ref 0.00–0.07)
Basophils Absolute: 0.1 K/uL (ref 0.0–0.1)
Basophils Relative: 1 %
Eosinophils Absolute: 0.2 K/uL (ref 0.0–0.5)
Eosinophils Relative: 3 %
HCT: 30.8 % — ABNORMAL LOW (ref 39.0–52.0)
Hemoglobin: 9.5 g/dL — ABNORMAL LOW (ref 13.0–17.0)
Immature Granulocytes: 0 %
Lymphocytes Relative: 30 %
Lymphs Abs: 1.7 K/uL (ref 0.7–4.0)
MCH: 30.9 pg (ref 26.0–34.0)
MCHC: 30.8 g/dL (ref 30.0–36.0)
MCV: 100.3 fL — ABNORMAL HIGH (ref 80.0–100.0)
Monocytes Absolute: 0.5 K/uL (ref 0.1–1.0)
Monocytes Relative: 9 %
Neutro Abs: 3.3 K/uL (ref 1.7–7.7)
Neutrophils Relative %: 57 %
Platelets: 186 K/uL (ref 150–400)
RBC: 3.07 MIL/uL — ABNORMAL LOW (ref 4.22–5.81)
RDW: 16 % — ABNORMAL HIGH (ref 11.5–15.5)
WBC: 5.8 K/uL (ref 4.0–10.5)
nRBC: 0.3 % — ABNORMAL HIGH (ref 0.0–0.2)

## 2022-09-05 LAB — GLUCOSE, CAPILLARY: Glucose-Capillary: 86 mg/dL (ref 70–99)

## 2022-09-05 MED ORDER — LIDOCAINE HCL (PF) 1 % IJ SOLN: 5.0000 mL | INTRAMUSCULAR | Status: DC | PRN

## 2022-09-05 MED ORDER — HEPARIN SODIUM (PORCINE) 1000 UNIT/ML IJ SOLN
3000.0000 [IU] | Freq: Once | INTRAMUSCULAR | Status: AC
  Administered 2022-09-05: 3000 [IU] via INTRAVENOUS

## 2022-09-05 MED ORDER — DOCUSATE SODIUM 100 MG PO CAPS: 100.0000 mg | ORAL_CAPSULE | Freq: Every day | ORAL | 0 refills | Status: AC

## 2022-09-05 MED ORDER — HEPARIN SODIUM (PORCINE) 1000 UNIT/ML DIALYSIS
1000.0000 [IU] | INTRAMUSCULAR | Status: DC | PRN
  Administered 2022-09-05: 3800 [IU] via INTRAVENOUS_CENTRAL
  Filled 2022-09-05 (×2): qty 1

## 2022-09-05 MED ORDER — POLYETHYLENE GLYCOL 3350 17 G PO PACK: 17.0000 g | PACK | Freq: Every day | ORAL | 0 refills | Status: AC | PRN

## 2022-09-05 MED ORDER — MIDODRINE HCL 10 MG PO TABS: 10.0000 mg | ORAL_TABLET | ORAL | 2 refills | Status: AC

## 2022-09-05 MED ORDER — CALCITRIOL 0.5 MCG PO CAPS: 1.0000 ug | ORAL_CAPSULE | ORAL | 2 refills | Status: AC

## 2022-09-05 MED ORDER — PENTAFLUOROPROP-TETRAFLUOROETH EX AERO: 1.0000 | INHALATION_SPRAY | CUTANEOUS | Status: DC | PRN

## 2022-09-05 MED ORDER — LIDOCAINE-PRILOCAINE 2.5-2.5 % EX CREA: 1.0000 | TOPICAL_CREAM | CUTANEOUS | Status: DC | PRN

## 2022-09-05 MED ORDER — ALTEPLASE 2 MG IJ SOLR: 2.0000 mg | Freq: Once | INTRAMUSCULAR | Status: DC | PRN

## 2022-09-05 NOTE — Progress Notes (Signed)
D/C order noted. Contacted [facility] to advise clinic of pt's d/c today and that pt should resume care on Saturday.   Olivia Canter Renal Navigator [phone]

## 2022-09-05 NOTE — TOC Transition Note (Signed)
Transition of Care [facility]) - CM/SW Discharge Note   Patient Details  Name: Keith [name] MRN: [id number] Date of Birth: 1965-09-26  Transition [facility]) CM/SW Contact:  Ralene Bathe, LCSW Phone Number: 09/05/2022, 11:14 AM   Clinical Narrative:    Patient will DC to: [facility] Anticipated DC date:  09/05/2022 Family notified: patient alert and oriented Transport by: Sharin Mons   Per MD patient ready for DC to SNF. RN to call report prior to discharge [phone] room 124a). RN, patient, and facility notified of DC. Discharge Summary sent to facility. DC packet on chart. Ambulance transport will be requested for patient.   CSW will sign off for now as social work intervention is no longer needed. Please consult [place] again if new needs arise.    Final next level of care: Skilled Nursing Facility Barriers to Discharge: No Barriers Identified   Patient Goals and CMS Choice CMS Medicare.gov Compare Post Acute Care list provided to:: Patient Choice offered to / list presented to : Patient  Discharge Placement                Patient [facility] bed at:  [facility]) Patient to be transferred to facility by: [facility] Name of family member notified: patient alert and oriented Patient and family notified of of transfer: 09/05/22  Discharge Plan and Services Additional resources added to the After Visit Summary for                                       Social Determinants of Health (SDOH) Interventions SDOH Screenings   Food Insecurity: No Food Insecurity (12/29/2021)  Housing: Low Risk  (12/29/2021)  Transportation Needs: No Transportation Needs (12/29/2021)  Utilities: Not At Risk (12/29/2021)  Depression (PHQ2-9): Low Risk  (08/17/2021)  Financial Resource Strain: Low Risk  (01/10/2021)  Stress: No Stress Concern Present (01/10/2021)  Tobacco Use: Low Risk  (09/04/2022)     Readmission Risk Interventions    03/13/2022    4:18 PM 01/01/2022   11:54 AM  10/10/2021    1:31 PM  Readmission Risk Prevention Plan  Transportation Screening Complete Complete Complete  Medication Review (RN Care Manager) Referral to Pharmacy Referral to Pharmacy Complete  PCP or Specialist appointment within 3-5 days of discharge  Complete Complete  HRI or Home Care Consult Complete Complete Complete  SW Recovery Care/Counseling Consult Complete Complete Complete  Palliative Care Screening Complete Not Applicable Not Applicable  Skilled Nursing Facility Complete Not Applicable Not Applicable

## 2022-09-05 NOTE — Plan of Care (Signed)
[facility] Patient Discharge Orders- [facility] CLINIC: [facility]  Patient's name: Keith [name] Admit/DC Dates: 09/02/2022 - 09/05/2022  Discharge Diagnoses: Malfunction AVF - s/p TDC placement 5/28 in IR  Hyperkalemia - resolved with HD Hypotension - Started Midodrine 10mg  on HD days  Aranesp: Given: Yes    Date and amount of last dose: given on 09/03/22   Last Hgb: 9.5 PRBC's Given: No  ESA dose for discharge: mircera 100 mcg IV q 2 weeks  IV Iron dose at discharge: Continue weekly Venofer  Heparin change: No  EDW Change: No  Bath Change: No  Access intervention/Change: Yes Details: L AVF placed Dec 2023. Apparently HD staff had difficulty cannulating at [facility]. AVF appeared infiltrated here. Please note that HD RN was able to successfully cannulate once while here but I was informed it was difficult: "AVF is deep". R TDC placed 5/28 in IR. Swelling of AVF is improving and remains with (+) B/T. Please continue to rest AVF until 09/10/22 and try to cannulate. Only have experienced staff cannulate him!!!!! If you guys still have issues, then send to [facility] for possible F'gram and/or further intervention. I already placed these orders in ecube!  Calcitriol change: No  Discharge Labs: Calcium 8.8 Phosphorus 5.5 Albumin 3.6 K+ 5.0  IV Antibiotics: No   On Coumadin?: No. On Plavix    D/C Meds to be reconciled by nurse after every discharge.  Completed By: Salome Holmes, NP-C    Reviewed by: MD:______ RN_______

## 2022-09-05 NOTE — Progress Notes (Signed)
   09/05/22 1150  Vitals  Temp 98 F (36.7 C)  Pulse Rate 81  Resp 16  BP 118/67  SpO2 100 %  O2 Device Room Air  Weight 122.8 kg  Type of Weight Post-Dialysis  Post Treatment  Dialyzer Clearance Lightly streaked  Duration of HD Treatment -hour(s) 3.5 hour(s)  Hemodialysis Intake (mL) 0 mL  Liters Processed 84  Fluid Removed (mL) 1600 mL  Tolerated HD Treatment Yes   Received patient in bed to unit.  Alert and oriented.  Informed consent signed and in chart.   TX duration:3.5  Patient tolerated well.  Transported back to the room  Alert, without acute distress.  Hand-off given to patient's nurse.   Access used: [facility] Access issues: [email or web address]  Total UF removed: 1600 Medication(s) given: mididrine 10mg  po x1   [facility]

## 2022-09-05 NOTE — Discharge Summary (Signed)
Physician Discharge Summary  Keith [name]:[id number] DOB: [birth year] DOA: 09/02/2022  PCP: Barbette Merino, NP  Admit date: 09/02/2022 Discharge date: 09/05/2022  Admitted From: Home  Discharge disposition: Home  Recommendations for Outpatient Follow-Up:   Follow up with your primary care provider in one week.  Check CBC, BMP, magnesium in the next visit Follow-up with nephrology as outpatient, continue hemodialysis. Follow-up with Dr. Thedore Mins infectious disease for HIV  Discharge Diagnosis:   Principal Problem:   Dialysis AV fistula malfunction, initial encounter [facility]) Active Problems:   HTN (hypertension)   Chronic pain   Hyperlipidemia   HIV disease (HCC)   Uncontrolled type 2 diabetes mellitus with hyperglycemia, with long-term current use of insulin with renal complication   PAD (peripheral artery disease) (HCC)   Obesity   BPH (benign prostatic hyperplasia)   Hyperkalemia, diminished renal excretion   ESRD on dialysis [facility])   Dialysis AV fistula malfunction (HCC)   Discharge Condition: Improved.  Diet recommendation: Low sodium, heart healthy.  Carbohydrate Modified  Wound care: None.  Code status: Full.   History of Present Illness:   Keith [name] is a 57 y.o. male with past medical history of hypertension, HIV, diabetes mellitus type 2, chronic pain, end-stage renal disease on hemodialysis,  BPH, and PVD s/p L AKA and R BKA presented to the hospital with fistula malfunction.  Patient was last admitted from 12/2-11 with Salmonella and EPEC gastroenteritis and transitioned from stage 4 CKD to ESRD and was started on HD.  In the ED, patient was noted to be hyperkalemic with peaked T waves and was given Lokelma.  Nephrology and IR consulted for fistula malfunction as patient had missed hemodialysis x 2.  Patient was then admitted hospital for further evaluation and treatment.   Hospital Course:   Following conditions were addressed during hospitalization  as listed below,  Fistula malfunction Patient with left upper extremity fistula malfunction and unable to dialyze since 5/23.  Nephrology and IR were consulted and patient underwent  right chest wall dialysis catheter placement.  Currently getting hemodialysis through hemodialysis catheter.     Hyperkalemia Present on presentation, has improved after insulin Lokelma calcium gluconate and hemodialysis.   HIV Patient has been followed by Dr. Thedore Mins, infectious disease.  Last known viral load was 8540 and CD4 89.  Continue Biktarvy, dapsone.  Will need to follow-up with ID as outpatient.   ESRD on Tuesday Thursday Saturday hemodialysis. Missed hemodialysis due to fistula malfunction.  Status post hemodialysis catheter placement.  Hemodialysis as per nephrology.   DM type II. Last known hemoglobin A1c was 11.5 in 12/2021.  Resume home regimen of Trulicity, Humalog and glargine on discharge  Essential hypertension. Now on midodrine during hemodialysis days.  Will continue on discharge.   PVD, L AKA, R BKA Continue ASA, Plavix, Lipitor   Chronic pain Continue duloxetine, escitalopram oxycodone   Gout -Continue allopurinol   BPH -Continue finasteride, tamsulosin   Obesity -Body mass index is 36.3 kg/m.  Patient would benefit from ongoing weight loss as outpatient.    Debility weakness deconditioning.  Patient is from [facility].  Has prosthesis in place.  Will continue rehabilitation on discharge.  Disposition.  At this time, patient is stable for disposition to skilled nursing facility.  Medical Consultants:   Nephrology Interventional radiology  Procedures:    Right internal jugular  hemodialysis catheter placement Hemodialysis Subjective:   Today, patient was seen and examined at bedside during hemodialysis.  Denies interval complaints.  Discharge Exam:   Vitals:   09/05/22 1000 09/05/22 1030  BP: (!) 108/58 120/89  Pulse: 81 79  Resp: 13 14  Temp:     SpO2: 98% 100%   Vitals:   09/05/22 0900 09/05/22 0930 09/05/22 1000 09/05/22 1030  BP: 116/69 128/80 (!) 108/58 120/89  Pulse: 76 79 81 79  Resp: 14 15 13 14   Temp:      TempSrc:      SpO2: 97% 100% 98% 100%  Weight:      Height:       Body mass index is 39.26 kg/m.  General: Alert awake, not in obvious distress, obese HENT: pupils equally reacting to light,  No scleral pallor or icterus noted. Oral mucosa is moist.  Chest:  Clear breath sounds.  Diminished breath sounds bilaterally. No crackles or wheezes.  Right chest wall hemodialysis catheter in place. CVS: S1 &S2 heard. No murmur.  Regular rate and rhythm. Abdomen: Soft, nontender, nondistended.  Bowel sounds are heard.   Extremities: Left upper extremity AV fistula, right below-knee amputation, left above-knee amputation.  Psych: Alert, awake and oriented, normal mood CNS:  No cranial nerve deficits.  Power equal in all extremities.   Skin: Warm and dry.  No rashes noted.  The results of significant diagnostics from this hospitalization (including imaging, microbiology, ancillary and laboratory) are listed below for reference.     Diagnostic Studies:   IR Fluoro Guide CV Line Right  Result Date: 09/03/2022 INDICATION: 201257 ESRD (end stage renal disease) (HCC) 201257 Malfunctioning LEFT upper extremity AV fistula. EXAM: TUNNELED CENTRAL VENOUS HEMODIALYSIS CATHETER PLACEMENT WITH ULTRASOUND AND FLUOROSCOPIC GUIDANCE MEDICATIONS: None ANESTHESIA/SEDATION: Local anesthetic was administered. FLUOROSCOPY TIME:  Fluoroscopic dose; 6 mGy COMPLICATIONS: None immediate. PROCEDURE: Informed written consent was obtained from the patient after a discussion of the risks, benefits, and alternatives to treatment. Questions regarding the procedure were encouraged and answered. The RIGHT neck and chest were prepped with chlorhexidine in a sterile fashion, and a sterile drape was applied covering the operative field. Maximum barrier sterile  technique with sterile gowns and gloves were used for the procedure. A timeout was performed prior to the initiation of the procedure. After creating a small venotomy incision, a micropuncture kit was utilized to access the internal jugular vein. Real-time ultrasound guidance was utilized for vascular access including the acquisition of a permanent ultrasound image documenting patency of the accessed vessel. The microwire was utilized to measure appropriate catheter length. A stiff Glidewire was advanced to the level of the IVC and the micropuncture sheath was exchanged for a peel-away sheath. A palindrome tunneled hemodialysis catheter measuring 23 cm from tip to cuff was tunneled in a retrograde fashion from the anterior chest wall to the venotomy incision. The catheter was then placed through the peel-away sheath with tips ultimately positioned within the superior aspect of the right atrium. Final catheter positioning was confirmed and documented with a spot radiographic image. The catheter aspirates and flushes normally. The catheter was flushed with appropriate volume heparin dwells. The catheter exit site was secured with a 0-Prolene retention suture. The venotomy incision was closed with Dermabond. Dressings were applied. The patient tolerated the procedure well without immediate post procedural complication. IMPRESSION: Successful placement of 23 cm tip to cuff tunneled hemodialysis catheter via the RIGHT internal jugular vein The tip of the catheter is positioned at the superior cavo-atrial junction. The catheter is ready for immediate use. Roanna Banning, MD [facility] Radiology Electronically Signed  By: Roanna Banning M.D.   On: 09/03/2022 09:03   IR US Guide Vasc Access Right  Result Date: 09/03/2022 INDICATION: 201257 ESRD (end stage renal disease) (HCC) 201257 Malfunctioning LEFT upper extremity AV fistula. EXAM: TUNNELED CENTRAL VENOUS HEMODIALYSIS  CATHETER PLACEMENT WITH ULTRASOUND AND FLUOROSCOPIC GUIDANCE MEDICATIONS: None ANESTHESIA/SEDATION: Local anesthetic was administered. FLUOROSCOPY TIME:  Fluoroscopic dose; 6 mGy COMPLICATIONS: None immediate. PROCEDURE: Informed written consent was obtained from the patient after a discussion of the risks, benefits, and alternatives to treatment. Questions regarding the procedure were encouraged and answered. The RIGHT neck and chest were prepped with chlorhexidine in a sterile fashion, and a sterile drape was applied covering the operative field. Maximum barrier sterile technique with sterile gowns and gloves were used for the procedure. A timeout was performed prior to the initiation of the procedure. After creating a small venotomy incision, a micropuncture kit was utilized to access the internal jugular vein. Real-time ultrasound guidance was utilized for vascular access including the acquisition of a permanent ultrasound image documenting patency of the accessed vessel. The microwire was utilized to measure appropriate catheter length. A stiff Glidewire was advanced to the level of the IVC and the micropuncture sheath was exchanged for a peel-away sheath. A palindrome tunneled hemodialysis catheter measuring 23 cm from tip to cuff was tunneled in a retrograde fashion from the anterior chest wall to the venotomy incision. The catheter was then placed through the peel-away sheath with tips ultimately positioned within the superior aspect of the right atrium. Final catheter positioning was confirmed and documented with a spot radiographic image. The catheter aspirates and flushes normally. The catheter was flushed with appropriate volume heparin dwells. The catheter exit site was secured with a 0-Prolene retention suture. The venotomy incision was closed with Dermabond. Dressings were applied. The patient tolerated the procedure well without immediate post procedural complication. IMPRESSION: Successful placement  of 23 cm tip to cuff tunneled hemodialysis catheter via the RIGHT internal jugular vein The tip of the catheter is positioned at the superior cavo-atrial junction. The catheter is ready for immediate use. Roanna Banning, MD [facility] Radiology Electronically Signed   By: Roanna Banning M.D.   On: 09/03/2022 09:03     Labs:   Basic Metabolic Panel: Recent Labs  Lab 09/02/22 1204 09/03/22 0056 09/04/22 0303 09/05/22 0203  NA 138 137 135 134*  K 6.1* 5.6* 4.2 5.0  CL 104 101 95* 96*  CO2 23 22 28 24   GLUCOSE 157* 94 134* 112*  BUN 63* 65* 38* 50*  CREATININE 8.07* 8.47* 6.02* 7.70*  CALCIUM 9.1 8.9 9.1 8.8*  MG  --   --  2.0  --   PHOS  --  5.7*  --  5.5*   GFR Estimated Creatinine Clearance: 14 mL/min (A) (by C-G formula based on SCr of 7.7 mg/dL (H)). Liver Function Tests: Recent Labs  Lab 09/02/22 1204 09/03/22 0056 09/05/22 0203  AST 33  --   --   ALT 34  --   --   ALKPHOS 72  --   --   BILITOT 0.9  --   --   PROT 7.6  --   --   ALBUMIN 3.9 3.6 3.6   No results for input(s): "LIPASE", "AMYLASE" in the last 168 hours. No results for input(s): "AMMONIA" in the last 168 hours. Coagulation profile No results for input(s): "INR", "PROTIME" in the last 168 hours.  CBC: Recent Labs  Lab 09/02/22 1204 09/03/22  1610 09/04/22 0303 09/05/22 0203  WBC 7.1 5.8 7.4 5.8  NEUTROABS 4.8  --   --  3.3  HGB 9.8* 9.3* 9.6* 9.5*  HCT 32.0* 30.0* 30.5* 30.8*  MCV 102.6* 100.0 98.1 100.3*  PLT 204 193 179 186   Cardiac Enzymes: No results for input(s): "CKTOTAL", "CKMB", "CKMBINDEX", "TROPONINI" in the last 168 hours. BNP: Invalid input(s): "POCBNP" CBG: Recent Labs  Lab 09/03/22 2159 09/04/22 0719 09/04/22 1117 09/04/22 1614 09/04/22 2056  GLUCAP 117* 99 98 90 159*   D-Dimer No results for input(s): "DDIMER" in the last 72 hours. Hgb A1c Recent Labs    09/02/22 1730  HGBA1C 5.6   Lipid Profile No results for  input(s): "CHOL", "HDL", "LDLCALC", "TRIG", "CHOLHDL", "LDLDIRECT" in the last 72 hours. Thyroid function studies No results for input(s): "TSH", "T4TOTAL", "T3FREE", "THYROIDAB" in the last 72 hours.  Invalid input(s): "FREET3" Anemia work up No results for input(s): "VITAMINB12", "FOLATE", "FERRITIN", "TIBC", "IRON", "RETICCTPCT" in the last 72 hours. Microbiology Recent Results (from the past 240 hour(s))  MRSA Next Gen by PCR, Nasal     Status: Abnormal   Collection Time: 09/02/22  9:18 PM   Specimen: Nasal Mucosa; Nasal Swab  Result Value Ref Range Status   MRSA by PCR Next Gen DETECTED (A) NOT DETECTED Final    Comment: RESULTS CALLED TO, READ BACK BY AND VERIFIED WITH RN T.BOKIAGON ON 09/03/22 AT 0142 BY NM (NOTE) The GeneXpert MRSA Assay (FDA approved for NASAL specimens only), is one component of a comprehensive MRSA colonization surveillance program. It is not intended to diagnose MRSA infection nor to guide or monitor treatment for MRSA infections. Test performance is not FDA approved in patients less than 73 years old. Performed at [facility], [address], [place], [place] [address]      Discharge Instructions:   Discharge Instructions     Call MD for:  persistant nausea and vomiting   Complete by: As directed    Call MD for:  redness, tenderness, or signs of infection (pain, swelling, redness, odor or green/yellow discharge around incision site)   Complete by: As directed    Call MD for:  temperature >100.4   Complete by: As directed    Diet - low sodium heart healthy   Complete by: As directed    Diet Carb Modified   Complete by: As directed    Discharge instructions   Complete by: As directed    Follow-up with your primary care provider in 1 week.  Continue hemodialysis as outpatient.  Follow-up with nephrology as scheduled by the clinic.  Seek medical attention for worsening symptoms. Keep Left arm rested.   Increase activity slowly   Complete by:  As directed    No wound care   Complete by: As directed       Allergies as of 09/05/2022       Reactions   Elemental Sulfur Itching   Metformin And Related Other (See Comments)   Stopped by MD- affected kidneys   Zetia [ezetimibe] Nausea Only   Sulfa Antibiotics Itching        Medication List     TAKE these medications    acetaminophen 500 MG tablet Commonly known as: TYLENOL Take 500 mg by mouth every 6 (six) hours as needed for fever, headache or moderate pain.   albuterol 108 (90 Base) MCG/ACT inhaler Commonly known as: VENTOLIN HFA Inhale 2 puffs into the lungs every 6 (six) hours as needed for  wheezing or shortness of breath.   allopurinol 100 MG tablet Commonly known as: ZYLOPRIM Take 100 mg by mouth daily.   aspirin EC 81 MG tablet Take 81 mg by mouth daily.   atorvastatin 80 MG tablet Commonly known as: LIPITOR Take 1 tablet (80 mg total) by mouth daily.   bictegravir-emtricitabine-tenofovir AF 50-200-25 MG Tabs tablet Commonly known as: BIKTARVY Take 1 tablet by mouth daily. Try to take at the same time each day with or without food. What changed:  when to take this additional instructions   calcitRIOL 0.5 MCG capsule Commonly known as: ROCALTROL Take 2 capsules (1 mcg total) by mouth every Tuesday, Thursday, and Saturday at 6 PM.   calcium acetate 667 MG capsule Commonly known as: PHOSLO Take 2 capsules (1,334 mg total) by mouth 3 (three) times daily with meals.   clopidogrel 75 MG tablet Commonly known as: PLAVIX Take 1 tablet (75 mg total) by mouth daily.   dapsone 100 MG tablet Take 1 tablet (100 mg total) by mouth daily.   docusate sodium 100 MG capsule Commonly known as: Colace Take 1 capsule (100 mg total) by mouth daily.   DULoxetine 60 MG capsule Commonly known as: CYMBALTA Take 60 mg by mouth every evening.   escitalopram 20 MG tablet Commonly known as: LEXAPRO Take 1 tablet (20 mg total) by mouth daily. What changed: when  to take this   finasteride 5 MG tablet Commonly known as: PROSCAR Take 5 mg by mouth every evening.   HumaLOG KwikPen 100 UNIT/ML KwikPen Generic drug: insulin lispro Inject 10 Units into the skin in the morning.   Insulin Glargine Solostar 300 UNIT/ML Sopn Inject 5-10 Units into the skin every evening.   midodrine 10 MG tablet Commonly known as: PROAMATINE Take 1 tablet (10 mg total) by mouth Every Tuesday,Thursday,and Saturday with dialysis.   multivitamin tablet Take 1 tablet by mouth daily.   onetouch ultrasoft lancets Use as instructed   Oxycodone HCl 10 MG Tabs Take 10 mg by mouth every 4 (four) hours as needed (pain).   polyethylene glycol 17 g packet Commonly known as: MiraLax Take 17 g by mouth daily as needed for mild constipation or moderate constipation.   tamsulosin 0.4 MG Caps capsule Commonly known as: FLOMAX Take 1 capsule (0.4 mg total) by mouth daily after supper. What changed: when to take this   Trulicity 0.75 MG/0.5ML Sopn Generic drug: Dulaglutide Inject 0.75 mg into the skin every Wednesday.        Follow-up Information     Barbette Merino, NP Follow up in 1 week(s).   Specialty: Adult Health Nurse Practitioner Contact information: [name] [place] [address] [phone]                  Time coordinating discharge: 39 minutes  Signed:  Decarla Siemen  [facility] 09/05/2022, 10:56 AM

## 2022-09-05 NOTE — Progress Notes (Signed)
[facility] Progress Note   Subjective:    Seen and examined patient on HD. Noted malfunction of recently placed TDC (5/28) 2nd kinking of extremal tubing. TDC running well on HD today. Also started Midodrine on HD days for hypotension. Noted BP 102/89. He reports feeling fine, He denies SOB, CP, N/V, and dizziness. Should be able to go home today after hemodialysis if he continues to be medically stable.  Objective Vitals:   09/05/22 0521 09/05/22 0745 09/05/22 0800 09/05/22 0830  BP: 116/72 114/68 113/67 (!) 101/54  Pulse: 87 77 79 74  Resp: 20 18 16 16   Temp: 98.2 F (36.8 C) (!) 97.4 F (36.3 C)    TempSrc: Oral     SpO2: 100% 99% 98% 99%  Weight: 123.5 kg 124.1 kg    Height:       Physical Exam General: Awake, alert, on RA, NAD Heart:S1 and S2; No murmurs, gallops, or rubs Lungs: Clear upp and laterally ; No wheezing, rales, or rhonchi Abdomen: Soft and non-tender Extremities: R BKA; L AKA Dialysis Access: R TDC (placed 5/28); L AVF (+) B/T-swelling is improving   Filed Weights   09/03/22 1933 09/05/22 0521 09/05/22 0745  Weight: 123.8 kg 123.5 kg 124.1 kg    Intake/Output Summary (Last 24 hours) at 09/05/2022 0912 Last data filed at 09/05/2022 0600 Gross per 24 hour  Intake 600 ml  Output 450 ml  Net 150 ml    Additional Objective Labs: Basic Metabolic Panel: Recent Labs  Lab 09/03/22 0056 09/04/22 0303 09/05/22 0203  NA 137 135 134*  K 5.6* 4.2 5.0  CL 101 95* 96*  CO2 22 28 24   GLUCOSE 94 134* 112*  BUN 65* 38* 50*  CREATININE 8.47* 6.02* 7.70*  CALCIUM 8.9 9.1 8.8*  PHOS 5.7*  --  5.5*   Liver Function Tests: Recent Labs  Lab 09/02/22 1204 09/03/22 0056 09/05/22 0203  AST 33  --   --   ALT 34  --   --   ALKPHOS 72  --   --   BILITOT 0.9  --   --   PROT 7.6  --   --   ALBUMIN 3.9 3.6 3.6   No results for input(s): "LIPASE", "AMYLASE" in the last 168 hours. CBC: Recent Labs  Lab 09/02/22 1204 09/03/22 0056  09/04/22 0303 09/05/22 0203  WBC 7.1 5.8 7.4 5.8  NEUTROABS 4.8  --   --  3.3  HGB 9.8* 9.3* 9.6* 9.5*  HCT 32.0* 30.0* 30.5* 30.8*  MCV 102.6* 100.0 98.1 100.3*  PLT 204 193 179 186   Blood Culture    Component Value Date/Time   SDES  03/09/2022 1705    BLOOD RIGHT HAND Performed at [facility], [address] [place], [place] [address]    SPECREQUEST  03/09/2022 1705    BOTTLES DRAWN AEROBIC AND ANAEROBIC Blood Culture adequate volume Performed at [facility], [address], [place], [place] [address]    CULT  03/09/2022 1705    NO GROWTH 5 DAYS Performed at [facility], [address], [place], [place] [address]    REPTSTATUS 03/14/2022 FINAL 03/09/2022 1705    Cardiac Enzymes: No results for input(s): "CKTOTAL", "CKMB", "CKMBINDEX", "TROPONINI" in the last 168 hours. CBG: Recent Labs  Lab 09/03/22 2159 09/04/22 0719 09/04/22 1117 09/04/22 1614 09/04/22 2056  GLUCAP 117* 99 98 90 159*   Iron Studies: No results for input(s): "IRON", "TIBC", "TRANSFERRIN", "FERRITIN" in  the last 72 hours. Lab Results  Component Value Date   INR 1.0 04/25/2020   INR 1.0 03/21/2020   INR 1.16 04/28/2017   Studies/Results: IR Fluoro Rm 30-60 Min  Result Date: 09/04/2022 CLINICAL DATA:  History of end-stage renal disease, post placement of tunneled hemodialysis catheter on 08/26/2022, now with decreased flows at dialysis. EXAM: IR FLUORO RM 0-60 MIN CONTRAST:  None FLUOROSCOPY: 6 seconds COMPARISON:  Image guided right internal jugular approach hemodialysis catheter placement-08/26/2022 FINDINGS: Patient was placed supine on the interventional radiology gantry and the dressing applied to the hemodialysis catheter was removed. At this time was noted at the catheter was significantly kinked below the dressing. Following on kinking of the external portion of the dialysis catheter, both lumens were noted to easily aspirate and flush. Spot fluoroscopic  image demonstrates stable positioning of the tunneled hemodialysis catheter with tip terminating at the level of the superior cavoatrial junction. Given above, decision was made not to proceed with definitive exchange and/or intervention at this time. IMPRESSION: Malfunction of the recently placed dialysis catheter is presumably secondary to kinking of the external dialysis catheter tubing. Appropriate functionality of the dialysis catheter was restored following re-dressing of the catheter. The dialysis catheter is ready for immediate usage. Electronically Signed   By: Simonne Come M.D.   On: 09/04/2022 16:07    Medications:   allopurinol  100 mg Oral Daily   aspirin EC  81 mg Oral Daily   atorvastatin  80 mg Oral Daily   bictegravir-emtricitabine-tenofovir AF  1 tablet Oral Q supper   calcitRIOL  1 mcg Oral Q T,Th,Sat-1800   calcium acetate  1,334 mg Oral TID WC   Chlorhexidine Gluconate Cloth  6 each Topical Q0600   Chlorhexidine Gluconate Cloth  6 each Topical Q0600   clopidogrel  75 mg Oral Daily   dapsone  100 mg Oral Daily   darbepoetin (ARANESP) injection - DIALYSIS  60 mcg Subcutaneous Q Tue-1800   DULoxetine  60 mg Oral QPM   finasteride  5 mg Oral QPM   heparin  5,000 Units Subcutaneous Q8H   insulin aspart  0-6 Units Subcutaneous TID WC   insulin glargine-yfgn  5 Units Subcutaneous QPM   midodrine  10 mg Oral Q T,Th,Sa-HD   mupirocin ointment  1 Application Nasal BID   sodium chloride flush  3 mL Intravenous Q12H   tamsulosin  0.4 mg Oral Daily    Dialysis Orders: [facility] TTS  4h  350/1.5  120.5kg  2/2.5 bath  L AVF   Hep 6000+ 4000 midrun - last HD 5/23, post wt 119.4kg - rocaltrol 1.0 mcg po tiw - venofer 50mg  weekly IV - mircera 75 mcg IV q 2 wks, last 5/14, due 5/28  Assessment/Plan: Malfunctioning L AVF - placed Dec 2023. S/p new TDC placement 5/28 by IR. Patient seen in IR yesterday: noted kink of external tubing which was resolved after re-dressing. [facility] working well on  HD. Goal is to continue to rest his L arm until the infiltration thoroughly heals and then we can try to re-cannulate in outpatient. If problems with AVF re-occur, plan to refer back to [facility]. Hyperkalemia - initiated hyperkalemia protocol at admit. K+ now 5. Patient on HD. ESRD - on HD TTS. On HD. Next HD hopefully as outpatient HTN/ volume - no vol excess on exam, on RA. Noted patient has been having episodes of hypotension with HD here and outpatient causing UF to be held. I started Midodrine 10mg  woth HD.  Okay to give an extra 10mg  dose mid-run if needed. Monitor trend. 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 Darbe 60 mcg SQ weekly while here, given on 5/28 MBD ckd - CCa in range. Phos okay. Cont po vdra.  PAD - hx bilat LE amputation IDDM  HIV - on ART Depression  Dispo - TDC is working well on HD today. He should be able to go home today after treatment if he remains medically stable. Will continue to rest his L arm in outpatient to ensure infiltration has resolved then will re-cannulate (use experienced staff only). Will refer back to [facility] in outpatient if problems were to re-occur.  Salome Holmes, NP [facility] 09/05/2022,9:12 AM  LOS: 1 day

## 2022-10-08 ENCOUNTER — Encounter: Payer: 59 | Admitting: Infectious Diseases

## 2022-10-11 ENCOUNTER — Telehealth: Payer: Self-pay | Admitting: Radiology

## 2022-10-11 NOTE — Telephone Encounter (Signed)
Patient called, he needs Rx for [facility].  I am not understanding for what though.  Please call him? Thanks

## 2022-10-11 NOTE — Telephone Encounter (Signed)
Patient is s/p a right BKA and a left AKA and needs to have a new rx faxed to [facility] for prosthetic supplies can you please sent this. Thanks

## 2022-10-14 ENCOUNTER — Telehealth: Payer: Self-pay | Admitting: Orthopedic Surgery

## 2022-10-14 NOTE — Telephone Encounter (Signed)
Patient called. Says [facility] needs a RX for a new leg for him. His call back number is [phone]

## 2022-10-14 NOTE — Telephone Encounter (Signed)
He hasn't been seen since 02/2022. Per all insurances, pt's need to be seen within 6 months of any new Rx's for prosthetics. Can you please call him to schedule him an appt for this? Thanks!!

## 2022-10-17 ENCOUNTER — Other Ambulatory Visit: Payer: Self-pay | Admitting: *Deleted

## 2022-10-17 DIAGNOSIS — N186 End stage renal disease: Secondary | ICD-10-CM

## 2022-10-29 ENCOUNTER — Ambulatory Visit (INDEPENDENT_AMBULATORY_CARE_PROVIDER_SITE_OTHER): Payer: 59 | Admitting: Orthopedic Surgery

## 2022-10-29 DIAGNOSIS — Z89612 Acquired absence of left leg above knee: Secondary | ICD-10-CM | POA: Diagnosis not present

## 2022-10-29 DIAGNOSIS — Z89511 Acquired absence of right leg below knee: Secondary | ICD-10-CM | POA: Diagnosis not present

## 2022-10-29 DIAGNOSIS — S78112A Complete traumatic amputation at level between left hip and knee, initial encounter: Secondary | ICD-10-CM

## 2022-10-30 ENCOUNTER — Encounter: Payer: Self-pay | Admitting: Orthopedic Surgery

## 2022-10-30 NOTE — Progress Notes (Signed)
Office Visit Note   Patient: Keith [name]           Date of Birth: 12/18/1965           MRN: [id number] Visit Date: 10/29/2022              Requested by: Barbette Merino, NP [address] [place] [address] [place],  [place] [address] PCP: Barbette Merino, NP  Chief Complaint  Patient presents with   Right Leg - Follow-up   Left Leg - Follow-up      HPI: Patient is a 57 year old gentleman who is seen for evaluation for right below-knee amputation and left above-knee amputation.  Assessment & Plan: Visit Diagnoses:  1. Hx of right BKA (HCC)   2. Unilateral AKA, left (HCC)     Plan: Patient was provided a prescription for [facility] for the 2 prosthesis.  Follow-Up Instructions: No follow-ups on file.   Ortho Exam  Patient is alert, oriented, no adenopathy, well-dressed, normal affect, normal respiratory effort. Examination the residual limbs are well-healed there is no cellulitis no open ulcers no signs of infection.  Patient is an existing left transfemoral amputee.  Patient's current comorbidities are not expected to impact the ability to function with the prescribed prosthesis. Patient verbally communicates a strong desire to use a prosthesis. Patient currently requires mobility aids to ambulate without a prosthesis.  Expects not to use mobility aids with a new prosthesis.  Patient is a K2 level ambulator that will use a prosthesis to walk around their home and the community over low level environmental barriers.   Patient is an existing right transtibial  amputee.  Patient's current comorbidities are not expected to impact the ability to function with the prescribed prosthesis. Patient verbally communicates a strong desire to use a prosthesis. Patient currently requires mobility aids to ambulate without a prosthesis.  Expects not to use mobility aids with a new prosthesis.       Imaging: No results found. No images are attached to the encounter.  Labs: Lab Results   Component Value Date   HGBA1C 5.6 09/02/2022   HGBA1C 11.5 (H) 12/08/2021   HGBA1C 6.0 (H) 08/17/2021   ESRSEDRATE 58 (H) 08/22/2021   ESRSEDRATE 128 (H) 10/18/2020   ESRSEDRATE 85 (H) 10/07/2020   CRP 11.2 (H) 10/18/2020   CRP 6.7 (H) 10/07/2020   CRP <0.8 11/02/2018   LABURIC 6.4 08/22/2021   LABURIC 6.3 08/07/2009   REPTSTATUS 03/14/2022 FINAL 03/09/2022   GRAMSTAIN  04/29/2017    NO WBC SEEN RARE GRAM POSITIVE COCCI IN PAIRS IN SINGLES    CULT  03/09/2022    NO GROWTH 5 DAYS Performed at [facility], [address], [place], [place] [address]    LABORGA KLEBSIELLA PNEUMONIAE (A) 01/11/2022     Lab Results  Component Value Date   ALBUMIN 3.6 09/05/2022   ALBUMIN 3.6 09/03/2022   ALBUMIN 3.9 09/02/2022   PREALBUMIN 25.5 06/06/2018   PREALBUMIN 7.4 (L) 03/15/2017    Lab Results  Component Value Date   MG 2.0 09/04/2022   MG 2.0 03/10/2022   MG 2.2 03/09/2022   Lab Results  Component Value Date   VD25OH 8.2 (L) 09/07/2018    Lab Results  Component Value Date   PREALBUMIN 25.5 06/06/2018   PREALBUMIN 7.4 (L) 03/15/2017      Latest Ref Rng & Units 09/05/2022    2:03 AM 09/04/2022    3:03 AM 09/03/2022   12:56 AM  CBC EXTENDED  WBC 4.0 - 10.5 K/uL 5.8  7.4  5.8   RBC 4.22 - 5.81 MIL/uL 3.07  3.11  3.00   Hemoglobin 13.0 - 17.0 g/dL 9.5  9.6  9.3   HCT 16.1 - 52.0 % 30.8  30.5  30.0   Platelets 150 - 400 K/uL 186  179  193   NEUT# 1.7 - 7.7 K/uL 3.3     Lymph# 0.7 - 4.0 K/uL 1.7        There is no height or weight on file to calculate BMI.  Orders:  No orders of the defined types were placed in this encounter.  No orders of the defined types were placed in this encounter.    Procedures: No procedures performed  Clinical Data: No additional findings.  ROS:  All other systems negative, except as noted in the HPI. Review of Systems  Objective: Vital Signs: There were no vitals taken for this visit.  Specialty Comments:  No  specialty comments available.  PMFS History: Patient Active Problem List   Diagnosis Date Noted   Dialysis AV fistula malfunction (HCC) 09/04/2022   Dialysis AV fistula malfunction, initial encounter (HCC) 09/02/2022   Hyperkalemia, diminished renal excretion 09/02/2022   ESRD on dialysis (HCC) 09/02/2022   AKI (acute kidney injury) (HCC) 03/09/2022   Gallbladder anomaly 03/09/2022   Fall at home, initial encounter 12/29/2021   Acute cystitis 12/29/2021   History of anemia due to chronic kidney disease 12/29/2021   Nonketotic hyperglycinemia, type II (HCC) 12/18/2021   Asymptomatic bacteriuria 12/18/2021   Type 2 diabetes mellitus with hyperosmolar nonketotic hyperglycemia (HCC) 12/06/2021   Hyperglycemia 12/06/2021   BPH (benign prostatic hyperplasia)    GERD (gastroesophageal reflux disease)    Headache 08/22/2021   Anxiety 02/13/2021   Healthcare maintenance 02/13/2021   History of left above knee amputation (HCC) 02/07/2021   Wound dehiscence 01/03/2021   Coagulopathy (HCC) 12/22/2020   CAD (coronary artery disease) 12/22/2020   Obesity 12/22/2020   Ulcer of heel due to diabetes mellitus (HCC)    Anemia    Hyperglycemia due to diabetes mellitus (HCC) 08/29/2020   Type 2 diabetes mellitus with hyperlipidemia (HCC)    PAD (peripheral artery disease) (HCC)    Uncontrolled type 2 diabetes mellitus with hyperglycemia, with long-term current use of insulin with renal complication 01/14/2019   Elevated glucose 01/14/2019   Hx of BKA, right (HCC) 01/14/2019   Pressure injury of skin 11/11/2018   CKD (chronic kidney disease) stage 4, GFR 15-29 ml/min (HCC) 11/10/2018   Amputation stump infection (HCC) 10/28/2018   Type II diabetes mellitus with renal manifestations (HCC) 08/07/2018   Uncontrolled type 2 diabetes mellitus with hyperosmolar nonketotic hyperglycemia (HCC) 08/07/2018   Non-pressure chronic ulcer of right calf limited to breakdown of skin (HCC) 07/06/2018   Type 2  diabetes mellitus without complication, without long-term current use of insulin (HCC) 06/15/2018   Chronic low back pain without sciatica 06/15/2018   Idiopathic chronic venous hypertension of left lower extremity with ulcer and inflammation (HCC)    Venous stasis ulcer of left calf limited to breakdown of skin without varicose veins (HCC) 04/23/2018   Acquired absence of right leg below knee (HCC) 06/13/2017   Diabetic polyneuropathy associated with type 2 diabetes mellitus (HCC) 01/23/2017   Diarrhea 09/28/2016   Nausea vomiting and diarrhea 09/28/2016   Onychomycosis of multiple toenails with type 2 diabetes mellitus (HCC) 08/29/2015   MRSA carrier 04/20/2014   Penile wart 02/22/2014  HIV disease (HCC)    Insulin-requiring or dependent type II diabetes mellitus (HCC) 02/04/2014   Dental anomaly 11/20/2012   Arthritis of right knee 02/23/2012   Hyperlipidemia 11/10/2011   Hyponatremia 11/10/2011   Chronic pain 08/07/2011   Meralgia paraesthetica 04/23/2011   ERECTILE DYSFUNCTION 08/22/2008   HTN (hypertension) 05/19/2008   Past Medical History:  Diagnosis Date   Abscess of right foot    abscess/ulcer of R transtibial amputation requiring IV abx   Acute ST elevation myocardial infarction (STEMI) due to occlusion of circumflex coronary artery (HCC) 03/22/2020   AIDS (HCC)    Anemia    CAD (coronary artery disease)    a. MI with stenting of OM1 in 11/2018 with residual disease. b. acute STEMI 03/2020 s/p DES to OM1   Chronic anemia    Chronic knee pain    right   Chronic pain    CKD (chronic kidney disease), stage IV (HCC)    CVA (cerebral vascular accident) (HCC) 04/2020   Diabetes type 2, uncontrolled    HgA1c 17.6 (04/27/2010)   Diabetic foot ulcer (HCC) 01/2017   right foot   Dilatation of aorta (HCC)    Erectile dysfunction    Genital warts    GERD (gastroesophageal reflux disease)    History of blood transfusion    HIV (human immunodeficiency virus infection) (HCC)  2009   CD4 count 100, VL 13800 (05/01/2010)   Hyperlipidemia    Hypertension    Myocardial infarction (HCC)    Neuropathy    Noncompliance with medication regimen    Osteomyelitis (HCC)    h/o hand   Osteomyelitis of metatarsal (HCC) 04/28/2017   Pneumonia    STEMI (ST elevation myocardial infarction) (HCC) 11/10/2018    Family History  Problem Relation Age of Onset   Hypertension Mother    Arthritis Father    Hypertension Father    Hypertension Brother    Cancer Maternal Grandmother 63       unknown type of cancer   Depression Paternal Grandmother     Past Surgical History:  Procedure Laterality Date   AMPUTATION Right 05/02/2017   Procedure: AMPUTATION TRANSMETARSAL;  Surgeon: Nadara Mustard, MD;  Location: [facility] OR;  Service: Orthopedics;  Laterality: Right;   AMPUTATION Right 06/13/2017   Procedure: RIGHT BELOW KNEE AMPUTATION;  Surgeon: Nadara Mustard, MD;  Location: [facility] OR;  Service: Orthopedics;  Laterality: Right;   AMPUTATION Left 10/27/2020   Procedure: LEFT BELOW KNEE AMPUTATION;  Surgeon: Nadara Mustard, MD;  Location: [facility] OR;  Service: Orthopedics;  Laterality: Left;   AMPUTATION Left 11/17/2020   Procedure: REVISION AMPUTATION BELOW KNEE, LEFT;  Surgeon: Nadara Mustard, MD;  Location: [facility] OR;  Service: Orthopedics;  Laterality: Left;   AMPUTATION Left 01/03/2021   Procedure: LEFT ABOVE KNEE AMPUTATION;  Surgeon: Nadara Mustard, MD;  Location: [facility] OR;  Service: Orthopedics;  Laterality: Left;   APPLICATION OF WOUND VAC Left 10/27/2020   Procedure: APPLICATION OF WOUND VAC;  Surgeon: Nadara Mustard, MD;  Location: [facility] OR;  Service: Orthopedics;  Laterality: Left;   AV FISTULA PLACEMENT Left 03/14/2022   Procedure: LEFT BRACHIOCEPHALIC ARTERIOVENOUS (AV) FISTULA CREATION;  Surgeon: Victorino Sparrow, MD;  Location: [facility] OR;  Service: Vascular;  Laterality: Left;  PERIPHERAL NERVE BLOCK   BELOW KNEE LEG AMPUTATION Right 06/13/2017   BELOW KNEE LEG AMPUTATION     CORONARY STENT  INTERVENTION N/A 11/10/2018   Procedure: CORONARY STENT INTERVENTION;  Surgeon: Bryan Lemma  W, MD;  Location: [facility] INVASIVE CV LAB;  Service: Cardiovascular;  Laterality: N/A;   CORONARY/GRAFT ACUTE MI REVASCULARIZATION N/A 03/21/2020   Procedure: Coronary/Graft Acute MI Revascularization;  Surgeon: Runell Gess, MD;  Location: [facility] LAB;  Service: Cardiovascular;  Laterality: N/A;   CYSTOSCOPY N/A 11/09/2020   Procedure: CYSTOSCOPY, EVACUATION OF CLOTS, FULGERATION OF BLADDER AND BILATERIAL RETROGRADE PYELOGRAMS;  Surgeon: Jannifer Hick, MD;  Location: [facility] OR;  Service: Urology;  Laterality: N/A;   HERNIA REPAIR     I & D EXTREMITY Left 08/21/2014   Procedure: INCISION AND DRAINAGE LEFT SMALL FINGER;  Surgeon: Betha Loa, MD;  Location: [facility] OR;  Service: Orthopedics;  Laterality: Left;   I & D EXTREMITY Right 03/18/2017   Procedure: IRRIGATION AND DEBRIDEMENT EXTREMITY;  Surgeon: Nadara Mustard, MD;  Location: [facility] OR;  Service: Orthopedics;  Laterality: Right;   IR FLUORO GUIDE CV LINE RIGHT  11/02/2020   IR FLUORO GUIDE CV LINE RIGHT  03/12/2022   IR FLUORO GUIDE CV LINE RIGHT  09/03/2022   IR FLUORO RM 30-60 MIN  09/04/2022   IR REMOVAL TUN CV CATH W/O FL  11/13/2020   IR US GUIDE VASC ACCESS RIGHT  11/02/2020   IR US GUIDE VASC ACCESS RIGHT  03/12/2022   IR US GUIDE VASC ACCESS RIGHT  09/03/2022   IR VENOCAVAGRAM SVC  03/12/2022   LEFT HEART CATH AND CORONARY ANGIOGRAPHY N/A 11/10/2018   Procedure: LEFT HEART CATH AND CORONARY ANGIOGRAPHY;  Surgeon: Marykay Lex, MD;  Location: [facility] INVASIVE CV LAB;  Service: Cardiovascular;  Laterality: N/A;   LEFT HEART CATH AND CORONARY ANGIOGRAPHY N/A 03/21/2020   Procedure: LEFT HEART CATH AND CORONARY ANGIOGRAPHY;  Surgeon: Runell Gess, MD;  Location: [facility];  Service: Cardiovascular;  Laterality: N/A;   MINOR IRRIGATION AND DEBRIDEMENT OF WOUND Right 04/22/2014   Procedure: IRRIGATION AND DEBRIDEMENT OF RIGHT NECK ABCESS;  Surgeon:  Osborn Coho, MD;  Location: [facility] OR;  Service: ENT;  Laterality: Right;   MULTIPLE EXTRACTIONS WITH ALVEOLOPLASTY N/A 01/18/2013   Procedure: MULTIPLE EXTRACION 3, 6, 7, 10, 11, 13, 21, 22, 27, 28, 29, 30 WITH ALVEOLOPLASTY;  Surgeon: Georgia Lopes, DDS;  Location: [facility] OR;  Service: Oral Surgery;  Laterality: N/A;   SKIN SPLIT GRAFT Right 03/21/2017   Procedure: IRRIGATION AND DEBRIDEMENT RIGHT FOOT AND APPLY SPLIT THICKNESS SKIN GRAFT AND WOUND VAC;  Surgeon: Nadara Mustard, MD;  Location: [facility] OR;  Service: Orthopedics;  Laterality: Right;   STUMP REVISION Left 12/02/2020   Procedure: REVISION LEFT BELOW KNEE AMPUTATION;  Surgeon: Nadara Mustard, MD;  Location: [facility] OR;  Service: Orthopedics;  Laterality: Left;   TEE WITHOUT CARDIOVERSION N/A 05/02/2017   Procedure: TRANSESOPHAGEAL ECHOCARDIOGRAM (TEE);  Surgeon: Elease Hashimoto Deloris Ping, MD;  Location: [facility] OR;  Service: Cardiovascular;  Laterality: N/A;  coincidental to orthopedic case   Social History   Occupational History   Occupation: disabled  Tobacco Use   Smoking status: Never    Passive exposure: Never   Smokeless tobacco: Never  Vaping Use   Vaping status: Never Used  Substance and Sexual Activity   Alcohol use: Not Currently   Drug use: Never    Comment: OTC ASA   Sexual activity: Yes    Partners: Female    Comment: declined condoms

## 2022-11-05 NOTE — Progress Notes (Unsigned)
HISTORY AND PHYSICAL     CC:  dialysis access Requesting Provider:  Maxie Barb, *  HPI: This is a 57 y.o. male here for evaluation of his hemodialysis access.  Pt has hx of left BC AVF 03/14/2022 by Dr. Karin Lieu.  He has TDC that was placed by IR on 09/03/2022.  Pt was seen in January and at that time, his fistula was not quite maturing and a little deep.  He was encouraged to exercise his arm and return for repeat duplex.  There was discussion about possible fistulogram vs surgical intervention.  He comes in today for repeat u/s.  He states they are now using his fistula and it is working well.  He states they are cannulating it easily and the machine is running without alarming.  He still has his catheter.  He denies any pain in the left hand.   Pt has hx of right BKA and left AKA with Dr. Lajoyce Corners.  He also has hx of HTN, DM, HIV, HLD  Dialysis access history: -left BC AVF 03/14/2022 Dr. Karin Lieu -[facility] placement 09/03/2022 IR   The pt is right hand dominant.    Pt is on dialysis.   Days of dialysis if applicable:  T/T/S    HD center:  [address] location.   The pt is on a statin for cholesterol management.  The pt is on a daily aspirin.  Other AC:  Plavix The pt is not on medication for hypertension.  The pt is  on medication for diabetes.   Tobacco hx:  never  Past Medical History:  Diagnosis Date   Abscess of right foot    abscess/ulcer of R transtibial amputation requiring IV abx   Acute ST elevation myocardial infarction (STEMI) due to occlusion of circumflex coronary artery (HCC) 03/22/2020   AIDS (HCC)    Anemia    CAD (coronary artery disease)    a. MI with stenting of OM1 in 11/2018 with residual disease. b. acute STEMI 03/2020 s/p DES to OM1   Chronic anemia    Chronic knee pain    right   Chronic pain    CKD (chronic kidney disease), stage IV (HCC)    CVA (cerebral vascular accident) (HCC) 04/2020   Diabetes type 2, uncontrolled    HgA1c 17.6  (04/27/2010)   Diabetic foot ulcer (HCC) 01/2017   right foot   Dilatation of aorta (HCC)    Erectile dysfunction    Genital warts    GERD (gastroesophageal reflux disease)    History of blood transfusion    HIV (human immunodeficiency virus infection) (HCC) 2009   CD4 count 100, VL 13800 (05/01/2010)   Hyperlipidemia    Hypertension    Myocardial infarction [facility])    Neuropathy    Noncompliance with medication regimen    Osteomyelitis (HCC)    h/o hand   Osteomyelitis of metatarsal (HCC) 04/28/2017   Pneumonia    STEMI (ST elevation myocardial infarction) (HCC) 11/10/2018    Past Surgical History:  Procedure Laterality Date   AMPUTATION Right 05/02/2017   Procedure: AMPUTATION TRANSMETARSAL;  Surgeon: Nadara Mustard, MD;  Location: [facility] OR;  Service: Orthopedics;  Laterality: Right;   AMPUTATION Right 06/13/2017   Procedure: RIGHT BELOW KNEE AMPUTATION;  Surgeon: Nadara Mustard, MD;  Location: [facility] OR;  Service: Orthopedics;  Laterality: Right;   AMPUTATION Left 10/27/2020   Procedure: LEFT BELOW KNEE AMPUTATION;  Surgeon: Nadara Mustard, MD;  Location: [facility] OR;  Service:  Orthopedics;  Laterality: Left;   AMPUTATION Left 11/17/2020   Procedure: REVISION AMPUTATION BELOW KNEE, LEFT;  Surgeon: Nadara Mustard, MD;  Location: [facility] OR;  Service: Orthopedics;  Laterality: Left;   AMPUTATION Left 01/03/2021   Procedure: LEFT ABOVE KNEE AMPUTATION;  Surgeon: Nadara Mustard, MD;  Location: [facility] OR;  Service: Orthopedics;  Laterality: Left;   APPLICATION OF WOUND VAC Left 10/27/2020   Procedure: APPLICATION OF WOUND VAC;  Surgeon: Nadara Mustard, MD;  Location: [facility] OR;  Service: Orthopedics;  Laterality: Left;   AV FISTULA PLACEMENT Left 03/14/2022   Procedure: LEFT BRACHIOCEPHALIC ARTERIOVENOUS (AV) FISTULA CREATION;  Surgeon: Victorino Sparrow, MD;  Location: [facility] OR;  Service: Vascular;  Laterality: Left;  PERIPHERAL NERVE BLOCK   BELOW KNEE LEG AMPUTATION Right 06/13/2017   BELOW KNEE LEG AMPUTATION      CORONARY STENT INTERVENTION N/A 11/10/2018   Procedure: CORONARY STENT INTERVENTION;  Surgeon: Marykay Lex, MD;  Location: [facility] INVASIVE CV LAB;  Service: Cardiovascular;  Laterality: N/A;   CORONARY/GRAFT ACUTE MI REVASCULARIZATION N/A 03/21/2020   Procedure: Coronary/Graft Acute MI Revascularization;  Surgeon: Runell Gess, MD;  Location: [facility] INVASIVE CV LAB;  Service: Cardiovascular;  Laterality: N/A;   CYSTOSCOPY N/A 11/09/2020   Procedure: CYSTOSCOPY, EVACUATION OF CLOTS, FULGERATION OF BLADDER AND BILATERIAL RETROGRADE PYELOGRAMS;  Surgeon: Jannifer Hick, MD;  Location: [facility] OR;  Service: Urology;  Laterality: N/A;   HERNIA REPAIR     I & D EXTREMITY Left 08/21/2014   Procedure: INCISION AND DRAINAGE LEFT SMALL FINGER;  Surgeon: Betha Loa, MD;  Location: [facility] OR;  Service: Orthopedics;  Laterality: Left;   I & D EXTREMITY Right 03/18/2017   Procedure: IRRIGATION AND DEBRIDEMENT EXTREMITY;  Surgeon: Nadara Mustard, MD;  Location: [facility] OR;  Service: Orthopedics;  Laterality: Right;   IR FLUORO GUIDE CV LINE RIGHT  11/02/2020   IR FLUORO GUIDE CV LINE RIGHT  03/12/2022   IR FLUORO GUIDE CV LINE RIGHT  09/03/2022   IR FLUORO RM 30-60 MIN  09/04/2022   IR REMOVAL TUN CV CATH W/O FL  11/13/2020   IR US GUIDE VASC ACCESS RIGHT  11/02/2020   IR US GUIDE VASC ACCESS RIGHT  03/12/2022   IR US GUIDE VASC ACCESS RIGHT  09/03/2022   IR VENOCAVAGRAM SVC  03/12/2022   LEFT HEART CATH AND CORONARY ANGIOGRAPHY N/A 11/10/2018   Procedure: LEFT HEART CATH AND CORONARY ANGIOGRAPHY;  Surgeon: Marykay Lex, MD;  Location: [facility] INVASIVE CV LAB;  Service: Cardiovascular;  Laterality: N/A;   LEFT HEART CATH AND CORONARY ANGIOGRAPHY N/A 03/21/2020   Procedure: LEFT HEART CATH AND CORONARY ANGIOGRAPHY;  Surgeon: Runell Gess, MD;  Location: [facility] INVASIVE CV LAB;  Service: Cardiovascular;  Laterality: N/A;   MINOR IRRIGATION AND DEBRIDEMENT OF WOUND Right 04/22/2014   Procedure: IRRIGATION AND DEBRIDEMENT OF RIGHT NECK  ABCESS;  Surgeon: Osborn Coho, MD;  Location: [facility] OR;  Service: ENT;  Laterality: Right;   MULTIPLE EXTRACTIONS WITH ALVEOLOPLASTY N/A 01/18/2013   Procedure: MULTIPLE EXTRACION 3, 6, 7, 10, 11, 13, 21, 22, 27, 28, 29, 30 WITH ALVEOLOPLASTY;  Surgeon: Georgia Lopes, DDS;  Location: [facility] OR;  Service: Oral Surgery;  Laterality: N/A;   SKIN SPLIT GRAFT Right 03/21/2017   Procedure: IRRIGATION AND DEBRIDEMENT RIGHT FOOT AND APPLY SPLIT THICKNESS SKIN GRAFT AND WOUND VAC;  Surgeon: Nadara Mustard, MD;  Location: [facility] OR;  Service: Orthopedics;  Laterality: Right;   STUMP REVISION Left 12/02/2020  Procedure: REVISION LEFT BELOW KNEE AMPUTATION;  Surgeon: Nadara Mustard, MD;  Location: [facility] OR;  Service: Orthopedics;  Laterality: Left;   TEE WITHOUT CARDIOVERSION N/A 05/02/2017   Procedure: TRANSESOPHAGEAL ECHOCARDIOGRAM (TEE);  Surgeon: Vesta Mixer, MD;  Location: [facility] OR;  Service: Cardiovascular;  Laterality: N/A;  coincidental to orthopedic case    Allergies  Allergen Reactions   Elemental Sulfur Itching   Metformin And Related Other (See Comments)    Stopped by MD- affected kidneys   Zetia [Ezetimibe] Nausea Only   Sulfa Antibiotics Itching    Current Outpatient Medications  Medication Sig Dispense Refill   acetaminophen (TYLENOL) 500 MG tablet Take 500 mg by mouth every 6 (six) hours as needed for fever, headache or moderate pain.     albuterol (VENTOLIN HFA) 108 (90 Base) MCG/ACT inhaler Inhale 2 puffs into the lungs every 6 (six) hours as needed for wheezing or shortness of breath.     allopurinol (ZYLOPRIM) 100 MG tablet Take 100 mg by mouth daily.     aspirin EC 81 MG tablet Take 81 mg by mouth daily.     atorvastatin (LIPITOR) 80 MG tablet Take 1 tablet (80 mg total) by mouth daily. 30 tablet 1   bictegravir-emtricitabine-tenofovir AF (BIKTARVY) 50-200-25 MG TABS tablet Take 1 tablet by mouth daily. Try to take at the same time each day with or without food. (Patient taking  differently: Take 1 tablet by mouth daily with supper.) 30 tablet 5   calcitRIOL (ROCALTROL) 0.5 MCG capsule Take 2 capsules (1 mcg total) by mouth every Tuesday, Thursday, and Saturday at 6 PM. 24 capsule 2   calcium acetate (PHOSLO) 667 MG capsule Take 2 capsules (1,334 mg total) by mouth 3 (three) times daily with meals.     clopidogrel (PLAVIX) 75 MG tablet Take 1 tablet (75 mg total) by mouth daily. 90 tablet 3   dapsone 100 MG tablet Take 1 tablet (100 mg total) by mouth daily. 30 tablet 5   docusate sodium (COLACE) 100 MG capsule Take 1 capsule (100 mg total) by mouth daily. 90 capsule 0   Dulaglutide (TRULICITY) 0.75 MG/0.5ML SOPN Inject 0.75 mg into the skin every Wednesday.     DULoxetine (CYMBALTA) 60 MG capsule Take 60 mg by mouth every evening.     escitalopram (LEXAPRO) 20 MG tablet Take 1 tablet (20 mg total) by mouth daily. (Patient taking differently: Take 20 mg by mouth daily at 2 PM.) 30 tablet 3   finasteride (PROSCAR) 5 MG tablet Take 5 mg by mouth every evening.     Insulin Glargine Solostar 300 UNIT/ML SOPN Inject 5-10 Units into the skin every evening.     insulin lispro (HUMALOG KWIKPEN) 100 UNIT/ML KwikPen Inject 10 Units into the skin in the morning.     Lancets (ONETOUCH ULTRASOFT) lancets Use as instructed 100 each 12   midodrine (PROAMATINE) 10 MG tablet Take 1 tablet (10 mg total) by mouth Every Tuesday,Thursday,and Saturday with dialysis. 12 tablet 2   Multiple Vitamin (MULTIVITAMIN) tablet Take 1 tablet by mouth daily.     Oxycodone HCl 10 MG TABS Take 10 mg by mouth every 4 (four) hours as needed (pain).     polyethylene glycol (MIRALAX) 17 g packet Take 17 g by mouth daily as needed for mild constipation or moderate constipation. 14 each 0   tamsulosin (FLOMAX) 0.4 MG CAPS capsule Take 1 capsule (0.4 mg total) by mouth daily after supper. (Patient taking differently: Take 0.4 mg by  mouth daily.)     No current facility-administered medications for this visit.     Family History  Problem Relation Age of Onset   Hypertension Mother    Arthritis Father    Hypertension Father    Hypertension Brother    Cancer Maternal Grandmother 16       unknown type of cancer   Depression Paternal Grandmother     Social History   Socioeconomic History   Marital status: Single    Spouse name: Not on file   Number of children: 3   Years of education: 12   Highest education level: Not on file  Occupational History   Occupation: disabled  Tobacco Use   Smoking status: Never    Passive exposure: Never   Smokeless tobacco: Never  Vaping Use   Vaping status: Never Used  Substance and Sexual Activity   Alcohol use: Not Currently   Drug use: Never    Comment: OTC ASA   Sexual activity: Yes    Partners: Female    Comment: declined condoms  Other Topics Concern   Not on file  Social History Narrative   ** Merged History Encounter ** Worked for the city of [place] for 18 years.Unemployed. Applying for disability.Medicaid patient.   Single    Religion Holiness   01/03/21 left BKA surgery    PMH right BK with prosthesis   Staying with his mother Kendal Hymen   Social Determinants of Health   Financial Resource Strain: Low Risk  (01/10/2021)   Overall Financial Resource Strain (CARDIA)    Difficulty of Paying Living Expenses: Not very hard  Food Insecurity: No Food Insecurity (12/29/2021)   Hunger Vital Sign    Worried About Running Out of Food in the Last Year: Never true    Ran Out of Food in the Last Year: Never true  Transportation Needs: No Transportation Needs (12/29/2021)   PRAPARE - Administrator, Civil Service (Medical): No    Lack of Transportation (Non-Medical): No  Physical Activity: Not on file  Stress: No Stress Concern Present (01/10/2021)   [employer] of Occupational Health - Occupational Stress Questionnaire    Feeling of Stress : Only a little  Social Connections: Not on file  Intimate Partner Violence: Not At  Risk (12/29/2021)   Humiliation, Afraid, Rape, and Kick questionnaire    Fear of Current or Ex-Partner: No    Emotionally Abused: No    Physically Abused: No    Sexually Abused: No     ROS: [x]  Positive   [ ]  Negative   [ ]  All sytems reviewed and are negative  Cardiac: []  chest pain/pressure []  SOB/DOE  Vascular: []  pain in legs while walking []  pain in feet when lying flat []  swelling in legs  Pulmonary: []  asthma []  wheezing  Neurologic: []  hx CVA/TIA  Hematologic: []  bleeding problems  GI []  GERD  GU: [x]  CKD/renal failure  [x]  HD---[]  M/W/F [x]  T/T/S  Psychiatric: []  hx of major depression  Integumentary: []  rashes []  ulcers  Constitutional: []  fever []  chills   PHYSICAL EXAMINATION:  Today's Vitals   11/06/22 0903  BP: 132/83  Pulse: 74  Resp: 18  Temp: 98 F (36.7 C)  TempSrc: Oral  SpO2: 95%  Weight: 200 lb 11.7 oz (91.1 kg)   Body mass index is 28.8 kg/m.    General:  WDWN male in NAD Gait: Not observed HENT: WNL Pulmonary: normal non-labored breathing  Cardiac: regular, without carotid bruits Abdomen:  obese Skin: without rashes Vascular Exam/Pulses:   Right Left  Radial 2+ (normal) 2+ (normal)   Extremities:  excellent thrill in fistula and is easily palpable.  Musculoskeletal: no muscle wasting or atrophy  Neurologic: A&O X 3  Non-Invasive Vascular Imaging:   Dialysis duplex on 11/06/2022: Diameter: 0.64-0.80cm Depth:  0.65-0.71cm There are a couple of branches noted   ASSESSMENT/PLAN: 57 y.o. male with ESRD here for evaluation of his hemodialysis access with hx of  left BC AVF 03/14/2022 by Dr. Karin Lieu.  He has TDC that was placed by IR on 09/03/2022.   -pt's fistula has an excellent thrill and is easily palpable.  He states that this is working well and they are not having any trouble cannulating the fistula and it is running well on the machine.  Would not recommend any intervention at this time.  I discussed with pt  that if they start having issues with the fistula, we would be glad to see him back.  -pt is on dialysis on T/T/S -discussed with pt that access does not last forever and will need intervention or even new access at some point. He expressed understanding -pt is right hand dominant    Doreatha Massed, [facility] [phone]  Clinic MD:   Edilia Bo

## 2022-11-06 ENCOUNTER — Encounter: Payer: Self-pay | Admitting: Physician Assistant

## 2022-11-06 ENCOUNTER — Ambulatory Visit (HOSPITAL_COMMUNITY)
Admission: RE | Admit: 2022-11-06 | Discharge: 2022-11-06 | Disposition: A | Discharge status: Home / Self Care | Payer: 59 | Source: Ambulatory Visit | Attending: Vascular Surgery | Admitting: Vascular Surgery

## 2022-11-06 ENCOUNTER — Ambulatory Visit (INDEPENDENT_AMBULATORY_CARE_PROVIDER_SITE_OTHER): Payer: 59 | Admitting: Physician Assistant

## 2022-11-06 VITALS — BP 132/83 | HR 74 | Temp 98.0°F | Resp 18 | Wt 200.7 lb

## 2022-11-06 DIAGNOSIS — N186 End stage renal disease: Secondary | ICD-10-CM | POA: Diagnosis present

## 2022-11-06 DIAGNOSIS — Z992 Dependence on renal dialysis: Secondary | ICD-10-CM

## 2022-12-10 ENCOUNTER — Other Ambulatory Visit: Payer: Self-pay

## 2022-12-10 DIAGNOSIS — Z79899 Other long term (current) drug therapy: Secondary | ICD-10-CM

## 2022-12-10 DIAGNOSIS — Z113 Encounter for screening for infections with a predominantly sexual mode of transmission: Secondary | ICD-10-CM

## 2022-12-10 DIAGNOSIS — B20 Human immunodeficiency virus [HIV] disease: Secondary | ICD-10-CM

## 2022-12-11 ENCOUNTER — Other Ambulatory Visit: Payer: 59

## 2022-12-23 ENCOUNTER — Other Ambulatory Visit (HOSPITAL_COMMUNITY): Payer: Self-pay

## 2022-12-25 ENCOUNTER — Encounter: Payer: 59 | Admitting: Internal Medicine

## 2023-01-17 ENCOUNTER — Telehealth: Payer: Self-pay | Admitting: Nurse Practitioner

## 2023-01-17 NOTE — Telephone Encounter (Signed)
Received fax requesting [phone] form to be filled out for pt. Pt has not been seen by either provider here, so I called pt to schedule appt for him to come in. Pt did not answer. Left voicemail.

## 2023-04-22 DIAGNOSIS — D509 Iron deficiency anemia, unspecified: Secondary | ICD-10-CM | POA: Insufficient documentation

## 2023-05-07 ENCOUNTER — Encounter (HOSPITAL_COMMUNITY)
Admission: RE | Disposition: A | Discharge status: Home / Self Care | Payer: Self-pay | Source: Home / Self Care | Attending: Nephrology

## 2023-05-07 ENCOUNTER — Encounter (HOSPITAL_COMMUNITY): Payer: Self-pay | Admitting: Nephrology

## 2023-05-07 ENCOUNTER — Ambulatory Visit (HOSPITAL_COMMUNITY)
Admission: RE | Admit: 2023-05-07 | Discharge: 2023-05-07 | Disposition: A | Discharge status: Home / Self Care | Payer: Medicare (Managed Care) | Attending: Nephrology | Admitting: Nephrology

## 2023-05-07 ENCOUNTER — Other Ambulatory Visit: Payer: Self-pay

## 2023-05-07 DIAGNOSIS — Z992 Dependence on renal dialysis: Secondary | ICD-10-CM | POA: Diagnosis not present

## 2023-05-07 DIAGNOSIS — E1122 Type 2 diabetes mellitus with diabetic chronic kidney disease: Secondary | ICD-10-CM | POA: Diagnosis not present

## 2023-05-07 DIAGNOSIS — Z79899 Other long term (current) drug therapy: Secondary | ICD-10-CM | POA: Insufficient documentation

## 2023-05-07 DIAGNOSIS — I120 Hypertensive chronic kidney disease with stage 5 chronic kidney disease or end stage renal disease: Secondary | ICD-10-CM | POA: Diagnosis not present

## 2023-05-07 DIAGNOSIS — Z7985 Long-term (current) use of injectable non-insulin antidiabetic drugs: Secondary | ICD-10-CM | POA: Insufficient documentation

## 2023-05-07 DIAGNOSIS — Z8673 Personal history of transient ischemic attack (TIA), and cerebral infarction without residual deficits: Secondary | ICD-10-CM | POA: Diagnosis not present

## 2023-05-07 DIAGNOSIS — E785 Hyperlipidemia, unspecified: Secondary | ICD-10-CM | POA: Diagnosis not present

## 2023-05-07 DIAGNOSIS — Y832 Surgical operation with anastomosis, bypass or graft as the cause of abnormal reaction of the patient, or of later complication, without mention of misadventure at the time of the procedure: Secondary | ICD-10-CM | POA: Diagnosis not present

## 2023-05-07 DIAGNOSIS — D631 Anemia in chronic kidney disease: Secondary | ICD-10-CM | POA: Insufficient documentation

## 2023-05-07 DIAGNOSIS — I252 Old myocardial infarction: Secondary | ICD-10-CM | POA: Diagnosis not present

## 2023-05-07 DIAGNOSIS — G894 Chronic pain syndrome: Secondary | ICD-10-CM | POA: Insufficient documentation

## 2023-05-07 DIAGNOSIS — N186 End stage renal disease: Secondary | ICD-10-CM | POA: Diagnosis not present

## 2023-05-07 DIAGNOSIS — T82858A Stenosis of vascular prosthetic devices, implants and grafts, initial encounter: Secondary | ICD-10-CM | POA: Diagnosis present

## 2023-05-07 DIAGNOSIS — Z21 Asymptomatic human immunodeficiency virus [HIV] infection status: Secondary | ICD-10-CM | POA: Insufficient documentation

## 2023-05-07 DIAGNOSIS — Z794 Long term (current) use of insulin: Secondary | ICD-10-CM | POA: Diagnosis not present

## 2023-05-07 DIAGNOSIS — I2510 Atherosclerotic heart disease of native coronary artery without angina pectoris: Secondary | ICD-10-CM | POA: Diagnosis not present

## 2023-05-07 DIAGNOSIS — N250 Renal osteodystrophy: Secondary | ICD-10-CM | POA: Diagnosis not present

## 2023-05-07 HISTORY — PX: A/V FISTULAGRAM: CATH118298

## 2023-05-07 SURGERY — A/V FISTULAGRAM
Anesthesia: LOCAL

## 2023-05-07 MED ORDER — HEPARIN (PORCINE) IN NACL 1000-0.9 UT/500ML-% IV SOLN
INTRAVENOUS | Status: DC | PRN
  Administered 2023-05-07: 500 mL

## 2023-05-07 MED ORDER — FENTANYL CITRATE (PF) 100 MCG/2ML IJ SOLN
INTRAMUSCULAR | Status: DC | PRN
  Administered 2023-05-07: 50 ug via INTRAVENOUS

## 2023-05-07 MED ORDER — LIDOCAINE HCL (PF) 1 % IJ SOLN
INTRAMUSCULAR | Status: DC | PRN
  Administered 2023-05-07: 2 mL via SUBCUTANEOUS

## 2023-05-07 MED ORDER — MIDAZOLAM HCL 2 MG/2ML IJ SOLN
INTRAMUSCULAR | Status: DC | PRN
  Administered 2023-05-07: 1 mg via INTRAVENOUS

## 2023-05-07 MED ORDER — MIDAZOLAM HCL 2 MG/2ML IJ SOLN
INTRAMUSCULAR | Status: AC
  Filled 2023-05-07: qty 2

## 2023-05-07 MED ORDER — FENTANYL CITRATE (PF) 100 MCG/2ML IJ SOLN
INTRAMUSCULAR | Status: AC
  Filled 2023-05-07: qty 2

## 2023-05-07 MED ORDER — LIDOCAINE HCL (PF) 1 % IJ SOLN
INTRAMUSCULAR | Status: AC
  Filled 2023-05-07: qty 30

## 2023-05-07 MED ORDER — IODIXANOL 320 MG/ML IV SOLN
INTRAVENOUS | Status: DC | PRN
  Administered 2023-05-07: 8 mL via INTRAVENOUS

## 2023-05-07 SURGICAL SUPPLY — 7 items
BAG SNAP BAND KOVER 36X36 (MISCELLANEOUS) ×1 IMPLANT
CATH ANGIO 5F BER2 65CM (CATHETERS) IMPLANT
COVER DOME SNAP 22 D (MISCELLANEOUS) IMPLANT
GUIDEWIRE ANGLED .035X150CM (WIRE) IMPLANT
SHEATH PINNACLE R/O II 6F 4CM (SHEATH) IMPLANT
TRAY PV CATH (CUSTOM PROCEDURE TRAY) ×1 IMPLANT
WIRE BENTSON .035X145CM (WIRE) IMPLANT

## 2023-05-07 NOTE — H&P (Addendum)
Chief Complaint: Decreased flows  Interval H&P  The patient has presented today for an angiogram/ angioplasty.  Various methods of treatment have been discussed with the patient.  After consideration of risk, benefits and other options for treatment, the patient has consented to a angiogram/ angioplasty with  possible stent placement.   Risks of angiogram with potential angioplasty and stenting if needed.contrast reaction, extravasation/ bleeding, dissection, hypotension and death were explained to the patient.  The patient's history has been reviewed and the patient has been examined, no changes in status.  Stable for angiogram/angioplasty  I have reviewed the patient's chart and labs.  Questions were answered to the patient's satisfaction.  Assessment/Plan: ESRD dialyzing at MWF Decreased access flows - planning on angiogram with possibly angioplasty.  Left brachiocephalic fistula placed by Dr. Karin Lieu on March 14, 2022. Renal osteodystrophy - continue binders per home regimen. Anemia - managed with ESA's and IV iron at dialysis center. HTN - resume home regimen.   HPI: Keith [name] is an 58 y.o. male with a history of coronary atherosclerotic heart disease, ST elevation MI, HIV, chronic pain syndrome on Percocet, CVA, diabetes, hyperlipidemia, hypertension, ESRD.  Patient coming in with decreasing flows in his left brachiocephalic fistula.    ROS Per HPI.  Chemistry and CBC: Creat  Date/Time Value Ref Range Status  04/15/2022 10:41 AM 4.47 (H) 0.70 - 1.30 mg/dL Final  40/98/1191 47:82 PM 3.51 (H) 0.70 - 1.30 mg/dL Final  95/62/1308 65:78 PM 1.24 0.70 - 1.30 mg/dL Final  46/96/2952 84:13 AM 1.77 (H) 0.70 - 1.33 mg/dL Final    Comment:    For patients >66 years of age, the reference limit for Creatinine is approximately 13% higher for people identified as African-American. Marland Kitchen   09/01/2019 04:08 PM 2.39 (H) 0.70 - 1.33 mg/dL Final    Comment:    For patients >49 years of  age, the reference limit for Creatinine is approximately 13% higher for people identified as African-American. .   09/17/2017 12:18 PM 1.37 (H) 0.70 - 1.33 mg/dL Final    Comment:    For patients >53 years of age, the reference limit for Creatinine is approximately 13% higher for people identified as African-American. .   01/20/2017 12:44 PM 2.64 (H) 0.70 - 1.33 mg/dL Final    Comment:    For patients >41 years of age, the reference limit for Creatinine is approximately 13% higher for people identified as African-American. Marland Kitchen   10/25/2016 10:07 AM 1.10 0.70 - 1.33 mg/dL Final    Comment:      For patients > or = 59 years of age: The upper reference limit for Creatinine is approximately 13% higher for people identified as African-American.     05/13/2016 06:17 PM 1.18 0.70 - 1.33 mg/dL Final    Comment:      For patients > or = 59 years of age: The upper reference limit for Creatinine is approximately 13% higher for people identified as African-American.     08/29/2015 04:46 PM 0.89 0.70 - 1.33 mg/dL Final  24/40/1027 25:36 AM 0.84 0.70 - 1.33 mg/dL Final  64/40/3474 25:95 AM 1.06 0.50 - 1.35 mg/dL Final  63/87/5643 32:95 AM 0.82 0.50 - 1.35 mg/dL Final  18/84/1660 63:01 AM 0.91 0.50 - 1.35 mg/dL Final  60/01/9322 55:73 PM 0.67 0.50 - 1.35 mg/dL Final  22/05/5425 06:23 AM 0.86 0.50 - 1.35 mg/dL Final  76/28/3151 76:16 PM 0.76 0.50 - 1.35 mg/dL Final  07/37/1062 69:48 PM 0.82 0.50 -  1.35 mg/dL Final  62/95/2841 32:44 AM 0.77 0.50 - 1.35 mg/dL Final  04/10/7251 66:44 PM 0.74 0.50 - 1.35 mg/dL Final  03/47/4259 56:38 AM 0.86 0.50 - 1.35 mg/dL Final  75/64/3329 51:88 AM 0.88 0.50 - 1.35 mg/dL Final  41/66/0630 16:01 AM 0.77 0.40 - 1.50 mg/dL Final  09/32/3557 32:20 AM 1.05 0.40 - 1.50 mg/dL Final   Creatinine, Ser  Date/Time Value Ref Range Status  09/05/2022 02:03 AM 7.70 (H) 0.61 - 1.24 mg/dL Final  25/42/7062 37:62 AM 6.02 (H) 0.61 - 1.24 mg/dL Final  83/15/1761  60:73 AM 8.47 (H) 0.61 - 1.24 mg/dL Final  71/09/2692 85:46 PM 8.07 (H) 0.61 - 1.24 mg/dL Final  27/06/5007 38:18 PM 5.50 (H) 0.61 - 1.24 mg/dL Final  29/93/7169 67:89 AM 4.61 (H) 0.61 - 1.24 mg/dL Final  38/01/1750 02:58 AM 9.44 (H) 0.61 - 1.24 mg/dL Final  52/77/8242 35:36 AM 7.43 (H) 0.61 - 1.24 mg/dL Final  14/43/1540 08:67 AM 10.06 (H) 0.61 - 1.24 mg/dL Final  61/95/0932 67:12 AM 9.70 (H) 0.61 - 1.24 mg/dL Final  45/80/9983 38:25 AM 12.91 (H) 0.61 - 1.24 mg/dL Final  05/39/7673 41:93 AM 13.34 (H) 0.61 - 1.24 mg/dL Final  79/05/4095 35:32 AM 12.88 (H) 0.61 - 1.24 mg/dL Final  99/24/2683 41:96 PM 12.50 (H) 0.61 - 1.24 mg/dL Final  22/29/7989 21:19 AM 12.59 (H) 0.61 - 1.24 mg/dL Final  41/74/0814 48:18 AM 3.78 (H) 0.61 - 1.24 mg/dL Final  56/31/4970 26:37 PM 3.49 (H) 0.61 - 1.24 mg/dL Final  85/88/5027 74:12 PM 3.26 (H) 0.61 - 1.24 mg/dL Final  87/86/7672 09:47 AM 2.75 (H) 0.61 - 1.24 mg/dL Final  09/62/8366 29:47 AM 2.79 (H) 0.61 - 1.24 mg/dL Final  65/46/5035 46:56 PM 3.14 (H) 0.61 - 1.24 mg/dL Final  81/27/5170 01:74 PM 3.32 (H) 0.61 - 1.24 mg/dL Final  94/49/6759 16:38 AM 3.77 (H) 0.61 - 1.24 mg/dL Final  46/65/9935 70:17 AM 3.24 (H) 0.61 - 1.24 mg/dL Final  79/39/0300 92:33 AM 3.45 (H) 0.61 - 1.24 mg/dL Final  00/76/2263 33:54 AM 3.31 (H) 0.61 - 1.24 mg/dL Final  56/25/6389 37:34 PM 3.55 (H) 0.61 - 1.24 mg/dL Final  28/76/8115 72:62 AM 3.86 (H) 0.61 - 1.24 mg/dL Final  03/55/9741 63:84 AM 4.06 (H) 0.61 - 1.24 mg/dL Final  53/64/6803 21:22 AM 3.86 (H) 0.61 - 1.24 mg/dL Final  48/25/0037 04:88 PM 3.76 (H) 0.61 - 1.24 mg/dL Final  89/16/9450 38:88 PM 3.66 (H) 0.61 - 1.24 mg/dL Final  28/00/3491 79:15 PM 3.68 (H) 0.61 - 1.24 mg/dL Final  05/69/7948 01:65 AM 3.84 (H) 0.61 - 1.24 mg/dL Final  53/74/8270 78:67 AM 3.89 (H) 0.61 - 1.24 mg/dL Final  54/49/2010 07:12 PM 3.80 (H) 0.61 - 1.24 mg/dL Final  19/75/8832 54:98 PM 4.02 (H) 0.61 - 1.24 mg/dL Final  26/41/5830 94:07 AM 4.18 (H)  0.61 - 1.24 mg/dL Final  68/11/8108 31:59 AM 3.84 (H) 0.61 - 1.24 mg/dL Final  45/85/9292 44:62 AM 4.39 (H) 0.61 - 1.24 mg/dL Final  86/38/1771 16:57 AM 4.53 (H) 0.61 - 1.24 mg/dL Final  90/38/3338 32:91 AM 4.56 (H) 0.61 - 1.24 mg/dL Final  91/66/0600 45:99 AM 4.44 (H) 0.61 - 1.24 mg/dL Final  77/41/4239 53:20 AM 4.02 (H) 0.61 - 1.24 mg/dL Final  23/34/3568 61:68 AM 3.65 (H) 0.61 - 1.24 mg/dL Final  37/29/0211 15:52 AM 3.62 (H) 0.61 - 1.24 mg/dL Final  11/08/2334 12:24 PM 3.39 (H) 0.61 - 1.24 mg/dL Final  49/75/3005 11:02 PM 3.31 (H) 0.61 -  1.24 mg/dL Final  16/01/9603 54:09 AM 3.34 (H) 0.61 - 1.24 mg/dL Final  81/19/1478 29:56 PM 3.76 (H) 0.61 - 1.24 mg/dL Final  21/30/8657 84:69 PM 2.79 (H) 0.61 - 1.24 mg/dL Final  62/95/2841 32:44 AM 3.38 (H) 0.61 - 1.24 mg/dL Final   No results for input(s): "NA", "K", "CL", "CO2", "GLUCOSE", "BUN", "CREATININE", "CALCIUM", "PHOS" in the last 168 hours.  Invalid input(s): "ALB" No results for input(s): "WBC", "NEUTROABS", "HGB", "HCT", "MCV", "PLT" in the last 168 hours. Liver Function Tests: No results for input(s): "AST", "ALT", "ALKPHOS", "BILITOT", "PROT", "ALBUMIN" in the last 168 hours. No results for input(s): "LIPASE", "AMYLASE" in the last 168 hours. No results for input(s): "AMMONIA" in the last 168 hours. Cardiac Enzymes: No results for input(s): "CKTOTAL", "CKMB", "CKMBINDEX", "TROPONINI" in the last 168 hours. Iron Studies: No results for input(s): "IRON", "TIBC", "TRANSFERRIN", "FERRITIN" in the last 72 hours. PT/INR: @LABRCNTIP (inr:5)  Xrays/Other Studies: )No results found. However, due to the size of the patient record, not all encounters were searched. Please check Results Review for a complete set of results. No results found.  PMH:   Past Medical History:  Diagnosis Date   Abscess of right foot    abscess/ulcer of R transtibial amputation requiring IV abx   Acute ST elevation myocardial infarction (STEMI) due to  occlusion of circumflex coronary artery (HCC) 03/22/2020   AIDS (HCC)    Anemia    CAD (coronary artery disease)    a. MI with stenting of OM1 in 11/2018 with residual disease. b. acute STEMI 03/2020 s/p DES to OM1   Chronic anemia    Chronic knee pain    right   Chronic pain    CKD (chronic kidney disease), stage IV (HCC)    CVA (cerebral vascular accident) (HCC) 04/2020   Diabetes type 2, uncontrolled    HgA1c 17.6 (04/27/2010)   Diabetic foot ulcer (HCC) 01/2017   right foot   Dilatation of aorta (HCC)    Erectile dysfunction    Genital warts    GERD (gastroesophageal reflux disease)    History of blood transfusion    HIV (human immunodeficiency virus infection) (HCC) 2009   CD4 count 100, VL 13800 (05/01/2010)   Hyperlipidemia    Hypertension    Myocardial infarction [facility])    Neuropathy    Noncompliance with medication regimen    Osteomyelitis (HCC)    h/o hand   Osteomyelitis of metatarsal (HCC) 04/28/2017   Pneumonia    STEMI (ST elevation myocardial infarction) (HCC) 11/10/2018    PSH:   Past Surgical History:  Procedure Laterality Date   AMPUTATION Right 05/02/2017   Procedure: AMPUTATION TRANSMETARSAL;  Surgeon: Nadara Mustard, MD;  Location: [facility] OR;  Service: Orthopedics;  Laterality: Right;   AMPUTATION Right 06/13/2017   Procedure: RIGHT BELOW KNEE AMPUTATION;  Surgeon: Nadara Mustard, MD;  Location: [facility] OR;  Service: Orthopedics;  Laterality: Right;   AMPUTATION Left 10/27/2020   Procedure: LEFT BELOW KNEE AMPUTATION;  Surgeon: Nadara Mustard, MD;  Location: [facility] OR;  Service: Orthopedics;  Laterality: Left;   AMPUTATION Left 11/17/2020   Procedure: REVISION AMPUTATION BELOW KNEE, LEFT;  Surgeon: Nadara Mustard, MD;  Location: [facility] OR;  Service: Orthopedics;  Laterality: Left;   AMPUTATION Left 01/03/2021   Procedure: LEFT ABOVE KNEE AMPUTATION;  Surgeon: Nadara Mustard, MD;  Location: [facility] OR;  Service: Orthopedics;  Laterality: Left;   APPLICATION OF WOUND VAC Left  10/27/2020   Procedure: APPLICATION OF  WOUND VAC;  Surgeon: Nadara Mustard, MD;  Location: [facility] OR;  Service: Orthopedics;  Laterality: Left;   AV FISTULA PLACEMENT Left 03/14/2022   Procedure: LEFT BRACHIOCEPHALIC ARTERIOVENOUS (AV) FISTULA CREATION;  Surgeon: Victorino Sparrow, MD;  Location: [facility] OR;  Service: Vascular;  Laterality: Left;  PERIPHERAL NERVE BLOCK   BELOW KNEE LEG AMPUTATION Right 06/13/2017   BELOW KNEE LEG AMPUTATION     CORONARY STENT INTERVENTION N/A 11/10/2018   Procedure: CORONARY STENT INTERVENTION;  Surgeon: Marykay Lex, MD;  Location: [facility] INVASIVE CV LAB;  Service: Cardiovascular;  Laterality: N/A;   CORONARY/GRAFT ACUTE MI REVASCULARIZATION N/A 03/21/2020   Procedure: Coronary/Graft Acute MI Revascularization;  Surgeon: Runell Gess, MD;  Location: [facility] INVASIVE CV LAB;  Service: Cardiovascular;  Laterality: N/A;   CYSTOSCOPY N/A 11/09/2020   Procedure: CYSTOSCOPY, EVACUATION OF CLOTS, FULGERATION OF BLADDER AND BILATERIAL RETROGRADE PYELOGRAMS;  Surgeon: Jannifer Hick, MD;  Location: [facility] OR;  Service: Urology;  Laterality: N/A;   HERNIA REPAIR     I & D EXTREMITY Left 08/21/2014   Procedure: INCISION AND DRAINAGE LEFT SMALL FINGER;  Surgeon: Betha Loa, MD;  Location: [facility] OR;  Service: Orthopedics;  Laterality: Left;   I & D EXTREMITY Right 03/18/2017   Procedure: IRRIGATION AND DEBRIDEMENT EXTREMITY;  Surgeon: Nadara Mustard, MD;  Location: [facility] OR;  Service: Orthopedics;  Laterality: Right;   IR FLUORO GUIDE CV LINE RIGHT  11/02/2020   IR FLUORO GUIDE CV LINE RIGHT  03/12/2022   IR FLUORO GUIDE CV LINE RIGHT  09/03/2022   IR FLUORO RM 30-60 MIN  09/04/2022   IR REMOVAL TUN CV CATH W/O FL  11/13/2020   IR US GUIDE VASC ACCESS RIGHT  11/02/2020   IR US GUIDE VASC ACCESS RIGHT  03/12/2022   IR US GUIDE VASC ACCESS RIGHT  09/03/2022   IR VENOCAVAGRAM SVC  03/12/2022   LEFT HEART CATH AND CORONARY ANGIOGRAPHY N/A 11/10/2018   Procedure: LEFT HEART CATH AND CORONARY ANGIOGRAPHY;   Surgeon: Marykay Lex, MD;  Location: [facility] INVASIVE CV LAB;  Service: Cardiovascular;  Laterality: N/A;   LEFT HEART CATH AND CORONARY ANGIOGRAPHY N/A 03/21/2020   Procedure: LEFT HEART CATH AND CORONARY ANGIOGRAPHY;  Surgeon: Runell Gess, MD;  Location: [facility] INVASIVE CV LAB;  Service: Cardiovascular;  Laterality: N/A;   MINOR IRRIGATION AND DEBRIDEMENT OF WOUND Right 04/22/2014   Procedure: IRRIGATION AND DEBRIDEMENT OF RIGHT NECK ABCESS;  Surgeon: Osborn Coho, MD;  Location: [facility] OR;  Service: ENT;  Laterality: Right;   MULTIPLE EXTRACTIONS WITH ALVEOLOPLASTY N/A 01/18/2013   Procedure: MULTIPLE EXTRACION 3, 6, 7, 10, 11, 13, 21, 22, 27, 28, 29, 30 WITH ALVEOLOPLASTY;  Surgeon: Georgia Lopes, DDS;  Location: [facility] OR;  Service: Oral Surgery;  Laterality: N/A;   SKIN SPLIT GRAFT Right 03/21/2017   Procedure: IRRIGATION AND DEBRIDEMENT RIGHT FOOT AND APPLY SPLIT THICKNESS SKIN GRAFT AND WOUND VAC;  Surgeon: Nadara Mustard, MD;  Location: [facility] OR;  Service: Orthopedics;  Laterality: Right;   STUMP REVISION Left 12/02/2020   Procedure: REVISION LEFT BELOW KNEE AMPUTATION;  Surgeon: Nadara Mustard, MD;  Location: [facility] OR;  Service: Orthopedics;  Laterality: Left;   TEE WITHOUT CARDIOVERSION N/A 05/02/2017   Procedure: TRANSESOPHAGEAL ECHOCARDIOGRAM (TEE);  Surgeon: Elease Hashimoto Deloris Ping, MD;  Location: [facility] OR;  Service: Cardiovascular;  Laterality: N/A;  coincidental to orthopedic case    Allergies:  Allergies  Allergen Reactions   Elemental Sulfur Itching   Metformin  And Related Other (See Comments)    Stopped by MD- affected kidneys   Zetia [Ezetimibe] Nausea Only   Sulfa Antibiotics Itching    Medications:   Prior to Admission medications   Medication Sig Start Date End Date Taking? Authorizing Provider  acetaminophen (TYLENOL) 500 MG tablet Take 500 mg by mouth every 6 (six) hours as needed for fever, headache or moderate pain.   Yes [provider]  albuterol (VENTOLIN HFA) 108 (90  Base) MCG/ACT inhaler Inhale 2 puffs into the lungs every 6 (six) hours as needed for wheezing or shortness of breath.   Yes [provider]  allopurinol (ZYLOPRIM) 100 MG tablet Take 100 mg by mouth daily.   Yes [provider]  aspirin EC 81 MG tablet Take 81 mg by mouth daily.   Yes [provider]  bictegravir-emtricitabine-tenofovir AF (BIKTARVY) 50-200-25 MG TABS tablet Take 1 tablet by mouth daily. Try to take at the same time each day with or without food. Patient taking differently: Take 1 tablet by mouth daily with supper. 04/15/22  Yes Danelle Earthly, MD  calcium acetate (PHOSLO) 667 MG capsule Take 2 capsules (1,334 mg total) by mouth 3 (three) times daily with meals. 03/18/22  Yes Tyrone Nine, MD  clopidogrel (PLAVIX) 75 MG tablet Take 1 tablet (75 mg total) by mouth daily. 11/15/21  Yes Corky Crafts, MD  dapsone 100 MG tablet Take 1 tablet (100 mg total) by mouth daily. 04/15/22  Yes Danelle Earthly, MD  DULoxetine (CYMBALTA) 60 MG capsule Take 60 mg by mouth every evening.   Yes [provider]  escitalopram (LEXAPRO) 20 MG tablet Take 1 tablet (20 mg total) by mouth daily. Patient taking differently: Take 20 mg by mouth daily at 2 PM. 10/19/21  Yes Kathlen Mody, MD  finasteride (PROSCAR) 5 MG tablet Take 5 mg by mouth every evening. 08/20/21  Yes [provider]  Insulin Glargine Solostar 300 UNIT/ML SOPN Inject 5-10 Units into the skin every evening.   Yes [provider]  insulin lispro (HUMALOG KWIKPEN) 100 UNIT/ML KwikPen Inject 10 Units into the skin in the morning.   Yes [provider]  Multiple Vitamin (MULTIVITAMIN) tablet Take 1 tablet by mouth daily.   Yes [provider]  Oxycodone HCl 10 MG TABS Take 10 mg by mouth every 4 (four) hours as needed (pain).   Yes [provider]  tamsulosin (FLOMAX) 0.4 MG CAPS capsule Take 1 capsule (0.4 mg total) by mouth daily after supper. Patient taking  differently: Take 0.4 mg by mouth daily. 03/18/22  Yes Tyrone Nine, MD  atorvastatin (LIPITOR) 80 MG tablet Take 1 tablet (80 mg total) by mouth daily. 09/01/20 10/12/22  Lanae Boast, MD  Dulaglutide (TRULICITY) 0.75 MG/0.5ML SOPN Inject 0.75 mg into the skin every Wednesday.    [provider]  Lancets [facility] ULTRASOFT) lancets Use as instructed 02/16/20   Kallie Locks, FNP  polyethylene glycol (MIRALAX) 17 g packet Take 17 g by mouth daily as needed for mild constipation or moderate constipation. 09/05/22   Pokhrel, Rebekah Chesterfield, MD    Discontinued Meds:  There are no discontinued medications.  Social History:  reports that he has never smoked. He has never been exposed to tobacco smoke. He has never used smokeless tobacco. He reports that he does not currently use alcohol. He reports that he does not use drugs.  Family History:   Family History  Problem Relation Age of Onset   Hypertension  Mother    Arthritis Father    Hypertension Father    Hypertension Brother    Cancer Maternal Grandmother 79       unknown type of cancer   Depression Paternal Grandmother     Pulse 70, height 5\' 10"  (1.778 m), weight 117.9 kg. General: Awake, alert, on RA, NAD Heart:S1 and S2; No murmurs, gallops, or rubs Lungs: Diminished bilaterally ; No wheezing, rales, or rhonchi Abdomen: Soft and non-tender Extremities: R BKA; L AKA Dialysis Access: Lt BCF       Ethelene Hal, MD 05/07/2023, 2:12 PM

## 2023-05-07 NOTE — Op Note (Signed)
Patient presents with decreased access flows of his left BCF (03/14/2022). On examination, the brachial cephalic fistula is aneurysmal but augmentation is normal. No signs of impending rupture were seen over the aneurysm.   Summary:  1)      The patient had a normal fistulogram.  2)      Aneurysmal body of the cephalic vein fistula a lateral collateral approximately 4 inches above the anastomosis; inflow unable to be visualized because of the size and caliber of the aneurysms as well as the collateral. 3)      The arch and centrals were widely patent. 4)      This left BCF remains amenable to future percutaneous intervention.  Description of procedure: The arm was prepped and draped in the usual sterile fashion. The left upper arm brachial cephalic fistula was cannulated (01093) with an 18G Angiocath needle directed in an antegrade direction. A guidewire was inserted and exchanged for a 6Fr sheath. Contrast [id number]) injection via the side port of the sheath was performed. The angiogram of the fistula (32202) showed an aneurysmal body of the left BCF, patent cephalic vein arch.   The inflow anastomosis not be visualized because of the size of the aneurysms as well as the lateral collateral approximately 3 to 4 inches above the anastomosis.  The cephalic arch and left centrals were patent.  The only place not well-visualized was the level of the sheath entry site; therefore, I cannulated approximately 1 inch above the anastomosis with a 18-gauge Angiocath and injection through the Angiocath revealed a very minimal 30% stenosis approximately 3 inches above the anastomosis.  Flows were already very rapid; therefore intervention was not performed.    Hemostasis: A 3-0 ethilon purse string suture was placed at the cannulation site on removal of the sheath.  Sedation: 1mg  Versed, Fentanyl. Sedation time. 17 minutes  Contrast. 8 mL  Monitoring: Because of the patient's comorbid conditions and  sedation during the procedure, continuous EKG monitoring and O2 saturation monitoring was performed throughout the procedure by the RN. There were no abnormal arrhythmias encountered.  Complications: None.   Diagnoses: I87.1 Stricture of vein  N18.6 ESRD T82.858A Stricture of access  Procedure Coding:  36901 Cannulation and angiogram of fistula Q9967 Contrast  Recommendations:  1. Continue to cannulate the fistula with 15G needles.  2. Refer back for problems with flows. 3. Remove the suture next treatment.   Discharge: The patient was discharged home in stable condition. The patient was given education regarding the care of the dialysis access AVF and specific instructions in case of any problems.

## 2023-05-07 NOTE — Discharge Instructions (Signed)

## 2023-07-16 ENCOUNTER — Ambulatory Visit (INDEPENDENT_AMBULATORY_CARE_PROVIDER_SITE_OTHER): Payer: Medicare (Managed Care) | Admitting: Internal Medicine

## 2023-07-16 ENCOUNTER — Other Ambulatory Visit: Payer: Self-pay

## 2023-07-16 ENCOUNTER — Other Ambulatory Visit (HOSPITAL_COMMUNITY)
Admission: RE | Admit: 2023-07-16 | Discharge: 2023-07-16 | Disposition: A | Discharge status: Home / Self Care | Payer: Medicare (Managed Care) | Source: Ambulatory Visit | Attending: Internal Medicine | Admitting: Internal Medicine

## 2023-07-16 ENCOUNTER — Encounter: Payer: Self-pay | Admitting: Internal Medicine

## 2023-07-16 VITALS — BP 103/62 | HR 69 | Temp 97.6°F

## 2023-07-16 DIAGNOSIS — Z992 Dependence on renal dialysis: Secondary | ICD-10-CM | POA: Diagnosis not present

## 2023-07-16 DIAGNOSIS — B20 Human immunodeficiency virus [HIV] disease: Secondary | ICD-10-CM | POA: Diagnosis not present

## 2023-07-16 DIAGNOSIS — N186 End stage renal disease: Secondary | ICD-10-CM | POA: Diagnosis not present

## 2023-07-16 MED ORDER — BICTEGRAVIR-EMTRICITAB-TENOFOV 50-200-25 MG PO TABS: 1.0000 | ORAL_TABLET | Freq: Every day | ORAL | 5 refills | Status: AC

## 2023-07-16 NOTE — Progress Notes (Unsigned)
 /      [facility]     HPI: Keith [name] is a 58 y.o. male presents for HIV management. Recently hospitalized with diarrhea , salmonella + GIP. Discharged on biktarvy, per record review of SNF notes pt is on dovato and Pifeltro. Diarrhea resolved. Today 07/16/23:pt otlerating bitarvy, no missed doses,  Date of diagnosis 1999, while incarcerated ART exposure prezcobix and triumeq (he had genotype 05-2018 showing background mutations, geno 11-2009 showing no mutations, 2010 M184V  Biktarvy switched  to in 2021 In the setting of ATN he was switched to Dovato+ pelfetri then back to biktarvy when started HD again Past OIs Risk factors: sex Partners in last 2 months 0, in the last 12 months1.  Oral sex -no Vaginal penile sex, contraception all   Social: He ilives in an appt by himself Etoh/drug/tobacco use: NO/no/no   Dapson? Hep B vaxx, pap  Past Medical History:  Diagnosis Date   Abscess of right foot    abscess/ulcer of R transtibial amputation requiring IV abx   Acute ST elevation myocardial infarction (STEMI) due to occlusion of circumflex coronary artery (HCC) 03/22/2020   AIDS (HCC)    Anemia    CAD (coronary artery disease)    a. MI with stenting of OM1 in 11/2018 with residual disease. b. acute STEMI 03/2020 s/p DES to OM1   Chronic anemia    Chronic knee pain    right   Chronic pain    CKD (chronic kidney disease), stage IV (HCC)    CVA (cerebral vascular accident) (HCC) 04/2020   Diabetes type 2, uncontrolled    HgA1c 17.6 (04/27/2010)   Diabetic foot ulcer (HCC) 01/2017   right foot   Dilatation of aorta (HCC)    Erectile dysfunction    Genital warts    GERD (gastroesophageal reflux disease)    History of blood transfusion    HIV (human immunodeficiency virus infection) (HCC) 2009   CD4 count 100, VL 13800 (05/01/2010)   Hyperlipidemia    Hypertension    Myocardial infarction [facility])    Neuropathy    Noncompliance with medication  regimen    Osteomyelitis (HCC)    h/o hand   Osteomyelitis of metatarsal (HCC) 04/28/2017   Pneumonia    STEMI (ST elevation myocardial infarction) (HCC) 11/10/2018    Past Surgical History:  Procedure Laterality Date   A/V FISTULAGRAM N/A 05/07/2023   Procedure: A/V Fistulagram;  Surgeon: Ethelene Hal, MD;  Location: [facility] INVASIVE CV LAB;  Service: Cardiovascular;  Laterality: N/A;   AMPUTATION Right 05/02/2017   Procedure: AMPUTATION TRANSMETARSAL;  Surgeon: Nadara Mustard, MD;  Location: [facility] OR;  Service: Orthopedics;  Laterality: Right;   AMPUTATION Right 06/13/2017   Procedure: RIGHT BELOW KNEE AMPUTATION;  Surgeon: Nadara Mustard, MD;  Location: [facility] OR;  Service: Orthopedics;  Laterality: Right;   AMPUTATION Left 10/27/2020   Procedure: LEFT BELOW KNEE AMPUTATION;  Surgeon: Nadara Mustard, MD;  Location: [facility] OR;  Service: Orthopedics;  Laterality: Left;   AMPUTATION Left 11/17/2020   Procedure: REVISION AMPUTATION BELOW KNEE, LEFT;  Surgeon: Nadara Mustard, MD;  Location: [facility] OR;  Service: Orthopedics;  Laterality: Left;   AMPUTATION Left 01/03/2021   Procedure: LEFT ABOVE KNEE AMPUTATION;  Surgeon: Nadara Mustard, MD;  Location: [facility] OR;  Service: Orthopedics;  Laterality: Left;   APPLICATION OF WOUND VAC Left 10/27/2020   Procedure: APPLICATION OF WOUND VAC;  Surgeon: Nadara Mustard, MD;  Location: [facility]  OR;  Service: Orthopedics;  Laterality: Left;   AV FISTULA PLACEMENT Left 03/14/2022   Procedure: LEFT BRACHIOCEPHALIC ARTERIOVENOUS (AV) FISTULA CREATION;  Surgeon: Victorino Sparrow, MD;  Location: [facility] OR;  Service: Vascular;  Laterality: Left;  PERIPHERAL NERVE BLOCK   BELOW KNEE LEG AMPUTATION Right 06/13/2017   BELOW KNEE LEG AMPUTATION     CORONARY STENT INTERVENTION N/A 11/10/2018   Procedure: CORONARY STENT INTERVENTION;  Surgeon: Marykay Lex, MD;  Location: [facility] INVASIVE CV LAB;  Service: Cardiovascular;  Laterality: N/A;   CORONARY/GRAFT ACUTE MI REVASCULARIZATION N/A 03/21/2020   Procedure:  Coronary/Graft Acute MI Revascularization;  Surgeon: Runell Gess, MD;  Location: [facility];  Service: Cardiovascular;  Laterality: N/A;   CYSTOSCOPY N/A 11/09/2020   Procedure: CYSTOSCOPY, EVACUATION OF CLOTS, FULGERATION OF BLADDER AND BILATERIAL RETROGRADE PYELOGRAMS;  Surgeon: Jannifer Hick, MD;  Location: [facility] OR;  Service: Urology;  Laterality: N/A;   HERNIA REPAIR     I & D EXTREMITY Left 08/21/2014   Procedure: INCISION AND DRAINAGE LEFT SMALL FINGER;  Surgeon: Betha Loa, MD;  Location: [facility] OR;  Service: Orthopedics;  Laterality: Left;   I & D EXTREMITY Right 03/18/2017   Procedure: IRRIGATION AND DEBRIDEMENT EXTREMITY;  Surgeon: Nadara Mustard, MD;  Location: [facility] OR;  Service: Orthopedics;  Laterality: Right;   IR FLUORO GUIDE CV LINE RIGHT  11/02/2020   IR FLUORO GUIDE CV LINE RIGHT  03/12/2022   IR FLUORO GUIDE CV LINE RIGHT  09/03/2022   IR FLUORO RM 30-60 MIN  09/04/2022   IR REMOVAL TUN CV CATH W/O FL  11/13/2020   IR US GUIDE VASC ACCESS RIGHT  11/02/2020   IR US GUIDE VASC ACCESS RIGHT  03/12/2022   IR US GUIDE VASC ACCESS RIGHT  09/03/2022   IR VENOCAVAGRAM SVC  03/12/2022   LEFT HEART CATH AND CORONARY ANGIOGRAPHY N/A 11/10/2018   Procedure: LEFT HEART CATH AND CORONARY ANGIOGRAPHY;  Surgeon: Marykay Lex, MD;  Location: [facility] INVASIVE CV LAB;  Service: Cardiovascular;  Laterality: N/A;   LEFT HEART CATH AND CORONARY ANGIOGRAPHY N/A 03/21/2020   Procedure: LEFT HEART CATH AND CORONARY ANGIOGRAPHY;  Surgeon: Runell Gess, MD;  [facility]: [facility];  Service: Cardiovascular;  Laterality: N/A;   MINOR IRRIGATION AND DEBRIDEMENT OF WOUND Right 04/22/2014   Procedure: IRRIGATION AND DEBRIDEMENT OF RIGHT NECK ABCESS;  Surgeon: Osborn Coho, MD;  Location: [facility] OR;  Service: ENT;  Laterality: Right;   MULTIPLE EXTRACTIONS WITH ALVEOLOPLASTY N/A 01/18/2013   Procedure: MULTIPLE EXTRACION 3, 6, 7, 10, 11, 13, 21, 22, 27, 28, 29, 30 WITH ALVEOLOPLASTY;  Surgeon: Georgia Lopes, DDS;  Location: [facility] OR;  Service: Oral Surgery;  Laterality: N/A;   SKIN SPLIT GRAFT Right 03/21/2017   Procedure: IRRIGATION AND DEBRIDEMENT RIGHT FOOT AND APPLY SPLIT THICKNESS SKIN GRAFT AND WOUND VAC;  Surgeon: Nadara Mustard, MD;  Location: [facility] OR;  Service: Orthopedics;  Laterality: Right;   STUMP REVISION Left 12/02/2020   Procedure: REVISION LEFT BELOW KNEE AMPUTATION;  Surgeon: Nadara Mustard, MD;  Location: [facility] OR;  Service: Orthopedics;  Laterality: Left;   TEE WITHOUT CARDIOVERSION N/A 05/02/2017   Procedure: TRANSESOPHAGEAL ECHOCARDIOGRAM (TEE);  Surgeon: Elease Hashimoto Deloris Ping, MD;  Location: [facility] OR;  Service: Cardiovascular;  Laterality: N/A;  coincidental to orthopedic case    Family History  Problem Relation Age of Onset   Hypertension Mother    Arthritis Father    Hypertension Father  Hypertension Brother    Cancer Maternal Grandmother 46       unknown type of cancer   Depression Paternal Grandmother    Current Outpatient Medications on File Prior to Visit  Medication Sig Dispense Refill   acetaminophen (TYLENOL) 500 MG tablet Take 500 mg by mouth every 6 (six) hours as needed for fever, headache or moderate pain.     albuterol (VENTOLIN HFA) 108 (90 Base) MCG/ACT inhaler Inhale 2 puffs into the lungs every 6 (six) hours as needed for wheezing or shortness of breath.     allopurinol (ZYLOPRIM) 100 MG tablet Take 100 mg by mouth daily.     aspirin EC 81 MG tablet Take 81 mg by mouth daily.     atorvastatin (LIPITOR) 80 MG tablet Take 1 tablet (80 mg total) by mouth daily. 30 tablet 1   bictegravir-emtricitabine-tenofovir AF (BIKTARVY) 50-200-25 MG TABS tablet Take 1 tablet by mouth daily. Try to take at the same time each day with or without food. (Patient taking differently: Take 1 tablet by mouth daily with supper.) 30 tablet 5   calcium acetate (PHOSLO) 667 MG capsule Take 2 capsules (1,334 mg total) by mouth 3 (three) times daily with meals.     clopidogrel (PLAVIX) 75  MG tablet Take 1 tablet (75 mg total) by mouth daily. 90 tablet 3   dapsone 100 MG tablet Take 1 tablet (100 mg total) by mouth daily. 30 tablet 5   Dulaglutide (TRULICITY) 0.75 MG/0.5ML SOPN Inject 0.75 mg into the skin every Wednesday.     DULoxetine (CYMBALTA) 60 MG capsule Take 60 mg by mouth every evening.     escitalopram (LEXAPRO) 20 MG tablet Take 1 tablet (20 mg total) by mouth daily. (Patient taking differently: Take 20 mg by mouth daily at 2 PM.) 30 tablet 3   finasteride (PROSCAR) 5 MG tablet Take 5 mg by mouth every evening.     Insulin Glargine Solostar 300 UNIT/ML SOPN Inject 5-10 Units into the skin every evening.     insulin lispro (HUMALOG KWIKPEN) 100 UNIT/ML KwikPen Inject 10 Units into the skin in the morning.     Lancets (ONETOUCH ULTRASOFT) lancets Use as instructed 100 each 12   Multiple Vitamin (MULTIVITAMIN) tablet Take 1 tablet by mouth daily.     Oxycodone HCl 10 MG TABS Take 10 mg by mouth every 4 (four) hours as needed (pain).     polyethylene glycol (MIRALAX) 17 g packet Take 17 g by mouth daily as needed for mild constipation or moderate constipation. 14 each 0   tamsulosin (FLOMAX) 0.4 MG CAPS capsule Take 1 capsule (0.4 mg total) by mouth daily after supper. (Patient taking differently: Take 0.4 mg by mouth daily.)     No current facility-administered medications on file prior to visit.    Allergies  Allergen Reactions   Elemental Sulfur Itching   Metformin And Related Other (See Comments)    Stopped by MD- affected kidneys   Zetia [Ezetimibe] Nausea Only   Sulfa Antibiotics Itching      Lab Results HIV 1 RNA Quant  Date Value  04/15/2022 Not Detected Copies/mL  03/10/2022 8,540 copies/mL  08/17/2021 <20 DETECTED copies/mL (A)   CD4 T Cell Abs (/uL)  Date Value  02/13/2021 166 (L)  12/12/2020 103 (L)  09/01/2019 89 (L)   Lab Results  Component Value Date   HIV1GENOSEQ REPORT 07/06/2014   Lab Results  Component Value Date   WBC 5.8  09/05/2022   HGB  9.5 (L) 09/05/2022   HCT 30.8 (L) 09/05/2022   MCV 100.3 (H) 09/05/2022   PLT 186 09/05/2022    Lab Results  Component Value Date   CREATININE 7.70 (H) 09/05/2022   BUN 50 (H) 09/05/2022   NA 134 (L) 09/05/2022   K 5.0 09/05/2022   CL 96 (L) 09/05/2022   CO2 24 09/05/2022   Lab Results  Component Value Date   ALT 34 09/02/2022   AST 33 09/02/2022   ALKPHOS 72 09/02/2022   BILITOT 0.9 09/02/2022    Lab Results  Component Value Date   CHOL 175 08/17/2021   TRIG 134 08/17/2021   HDL 45 08/17/2021   LDLCALC 105 (H) 08/17/2021   Lab Results  Component Value Date   HAV REACTIVE (A) 04/15/2022   Lab Results  Component Value Date   HEPBSAG NON REACTIVE 09/02/2022   HEPBSAB NEG 11/20/2012   Lab Results  Component Value Date   HCVAB NON REACTIVE 11/02/2020   Lab Results  Component Value Date   CHLAMYDIAWP Negative 11/18/2016   N Negative 11/18/2016   No results found for: "GCPROBEAPT" No results found for: "QUANTGOLD"  Assessment/Plan #HIV /Asx #ESRD on HD has LUE fistula -VL nd on 04/15/22, cd4 89(9% on 12/3//23) -d/c'd Dovato, Pifeltro on 04/15/22 and started biktarvy-Start biktarvy and dapsone -Requested immunization from [facility]ily(flu/HBV) -f/u in 6 months     #Vaccination COVID Flu not 2023 at SNF Monkeypox PCV 13 10/15/18, PCV 23 03/16/22 Meningitis x1 05/13/16 HepA immune HEpB immune Shingles   #Health maintenance -Quantiferon today 07/16/23 -RPR NT 04/15/22, rpr today  07/16/23 -HCV 11/02/20 NR, today 07/16/23 -Colonoscopy discuss at next visit -gcurine oral . Does not produce urine   Danelle Earthly, MD [facility]

## 2023-07-17 ENCOUNTER — Encounter: Payer: Self-pay | Admitting: Internal Medicine

## 2023-07-17 LAB — T-HELPER CELLS (CD4) COUNT (NOT AT ARMC)
CD4 % Helper T Cell: 10 % — ABNORMAL LOW (ref 33–65)
CD4 T Cell Abs: 157 /uL — ABNORMAL LOW (ref 400–1790)

## 2023-07-17 LAB — CYTOLOGY, (ORAL, ANAL, URETHRAL) ANCILLARY ONLY
Chlamydia: NEGATIVE
Comment: NEGATIVE
Comment: NORMAL
Neisseria Gonorrhea: NEGATIVE

## 2023-07-19 LAB — QUANTIFERON-TB GOLD PLUS
Mitogen-NIL: 7.13 [IU]/mL
NIL: 0.04 [IU]/mL
QuantiFERON-TB Gold Plus: NEGATIVE
TB1-NIL: 0 [IU]/mL
TB2-NIL: 0 [IU]/mL

## 2023-07-19 LAB — CBC WITH DIFFERENTIAL/PLATELET
Absolute Lymphocytes: 1811 {cells}/uL (ref 850–3900)
Absolute Monocytes: 595 {cells}/uL (ref 200–950)
Basophils Absolute: 43 {cells}/uL (ref 0–200)
Basophils Relative: 0.5 %
Eosinophils Absolute: 153 {cells}/uL (ref 15–500)
Eosinophils Relative: 1.8 %
HCT: 34.1 % — ABNORMAL LOW (ref 38.5–50.0)
Hemoglobin: 11.1 g/dL — ABNORMAL LOW (ref 13.2–17.1)
MCH: 28.7 pg (ref 27.0–33.0)
MCHC: 32.6 g/dL (ref 32.0–36.0)
MCV: 88.1 fL (ref 80.0–100.0)
MPV: 10.1 fL (ref 7.5–12.5)
Monocytes Relative: 7 %
Neutro Abs: 5899 {cells}/uL (ref 1500–7800)
Neutrophils Relative %: 69.4 %
Platelets: 321 10*3/uL (ref 140–400)
RBC: 3.87 10*6/uL — ABNORMAL LOW (ref 4.20–5.80)
RDW: 13 % (ref 11.0–15.0)
Total Lymphocyte: 21.3 %
WBC: 8.5 10*3/uL (ref 3.8–10.8)

## 2023-07-19 LAB — COMPLETE METABOLIC PANEL WITHOUT GFR
AG Ratio: 1.1 (calc) (ref 1.0–2.5)
ALT: 8 U/L — ABNORMAL LOW (ref 9–46)
AST: 15 U/L (ref 10–35)
Albumin: 3.9 g/dL (ref 3.6–5.1)
Alkaline phosphatase (APISO): 82 U/L (ref 35–144)
BUN/Creatinine Ratio: 6 (calc) (ref 6–22)
BUN: 26 mg/dL — ABNORMAL HIGH (ref 7–25)
CO2: 31 mmol/L (ref 20–32)
Calcium: 9.2 mg/dL (ref 8.6–10.3)
Chloride: 93 mmol/L — ABNORMAL LOW (ref 98–110)
Creat: 4.39 mg/dL — ABNORMAL HIGH (ref 0.70–1.30)
Globulin: 3.7 g/dL (ref 1.9–3.7)
Glucose, Bld: 192 mg/dL — ABNORMAL HIGH (ref 65–99)
Potassium: 3.8 mmol/L (ref 3.5–5.3)
Sodium: 135 mmol/L (ref 135–146)
Total Bilirubin: 0.3 mg/dL (ref 0.2–1.2)
Total Protein: 7.6 g/dL (ref 6.1–8.1)

## 2023-07-19 LAB — HIV-1 RNA QUANT-NO REFLEX-BLD
HIV 1 RNA Quant: NOT DETECTED {copies}/mL
HIV-1 RNA Quant, Log: NOT DETECTED {Log_copies}/mL

## 2023-07-19 LAB — RPR: RPR Ser Ql: NONREACTIVE

## 2023-07-19 LAB — HEPATITIS C ANTIBODY: Hepatitis C Ab: NONREACTIVE

## 2023-09-04 ENCOUNTER — Emergency Department (HOSPITAL_COMMUNITY)
Admission: EM | Admit: 2023-09-04 | Discharge: 2023-09-04 | Disposition: A | Discharge status: Home / Self Care | Payer: Medicare (Managed Care) | Attending: Emergency Medicine | Admitting: Emergency Medicine

## 2023-09-04 ENCOUNTER — Encounter (HOSPITAL_COMMUNITY): Payer: Self-pay | Admitting: Emergency Medicine

## 2023-09-04 ENCOUNTER — Other Ambulatory Visit: Payer: Self-pay

## 2023-09-04 DIAGNOSIS — Z794 Long term (current) use of insulin: Secondary | ICD-10-CM | POA: Diagnosis not present

## 2023-09-04 DIAGNOSIS — I129 Hypertensive chronic kidney disease with stage 1 through stage 4 chronic kidney disease, or unspecified chronic kidney disease: Secondary | ICD-10-CM | POA: Diagnosis not present

## 2023-09-04 DIAGNOSIS — I2510 Atherosclerotic heart disease of native coronary artery without angina pectoris: Secondary | ICD-10-CM | POA: Diagnosis not present

## 2023-09-04 DIAGNOSIS — N184 Chronic kidney disease, stage 4 (severe): Secondary | ICD-10-CM | POA: Diagnosis not present

## 2023-09-04 DIAGNOSIS — E1122 Type 2 diabetes mellitus with diabetic chronic kidney disease: Secondary | ICD-10-CM | POA: Diagnosis not present

## 2023-09-04 DIAGNOSIS — Z7982 Long term (current) use of aspirin: Secondary | ICD-10-CM | POA: Diagnosis not present

## 2023-09-04 DIAGNOSIS — G8929 Other chronic pain: Secondary | ICD-10-CM

## 2023-09-04 DIAGNOSIS — R519 Headache, unspecified: Secondary | ICD-10-CM | POA: Insufficient documentation

## 2023-09-04 DIAGNOSIS — R52 Pain, unspecified: Secondary | ICD-10-CM

## 2023-09-04 DIAGNOSIS — E11621 Type 2 diabetes mellitus with foot ulcer: Secondary | ICD-10-CM | POA: Insufficient documentation

## 2023-09-04 DIAGNOSIS — B20 Human immunodeficiency virus [HIV] disease: Secondary | ICD-10-CM | POA: Insufficient documentation

## 2023-09-04 DIAGNOSIS — Z7901 Long term (current) use of anticoagulants: Secondary | ICD-10-CM | POA: Insufficient documentation

## 2023-09-04 DIAGNOSIS — L97519 Non-pressure chronic ulcer of other part of right foot with unspecified severity: Secondary | ICD-10-CM | POA: Diagnosis not present

## 2023-09-04 DIAGNOSIS — Z992 Dependence on renal dialysis: Secondary | ICD-10-CM | POA: Diagnosis not present

## 2023-09-04 LAB — BASIC METABOLIC PANEL WITH GFR
Anion gap: 12 (ref 5–15)
BUN: 22 mg/dL — ABNORMAL HIGH (ref 6–20)
CO2: 27 mmol/L (ref 22–32)
Calcium: 8.5 mg/dL — ABNORMAL LOW (ref 8.9–10.3)
Chloride: 98 mmol/L (ref 98–111)
Creatinine, Ser: 4.41 mg/dL — ABNORMAL HIGH (ref 0.61–1.24)
GFR, Estimated: 15 mL/min — ABNORMAL LOW (ref >60)
Glucose, Bld: 241 mg/dL — ABNORMAL HIGH (ref 70–99)
Potassium: 4.5 mmol/L (ref 3.5–5.1)
Sodium: 137 mmol/L (ref 135–145)

## 2023-09-04 LAB — CBC WITH DIFFERENTIAL/PLATELET
Abs Immature Granulocytes: 0.02 10*3/uL (ref 0.00–0.07)
Basophils Absolute: 0.1 10*3/uL (ref 0.0–0.1)
Basophils Relative: 1 %
Eosinophils Absolute: 0.1 10*3/uL (ref 0.0–0.5)
Eosinophils Relative: 2 %
HCT: 38 % — ABNORMAL LOW (ref 39.0–52.0)
Hemoglobin: 12 g/dL — ABNORMAL LOW (ref 13.0–17.0)
Immature Granulocytes: 0 %
Lymphocytes Relative: 17 %
Lymphs Abs: 1.3 10*3/uL (ref 0.7–4.0)
MCH: 29 pg (ref 26.0–34.0)
MCHC: 31.6 g/dL (ref 30.0–36.0)
MCV: 91.8 fL (ref 80.0–100.0)
Monocytes Absolute: 0.6 10*3/uL (ref 0.1–1.0)
Monocytes Relative: 8 %
Neutro Abs: 5.4 10*3/uL (ref 1.7–7.7)
Neutrophils Relative %: 72 %
Platelets: 212 10*3/uL (ref 150–400)
RBC: 4.14 MIL/uL — ABNORMAL LOW (ref 4.22–5.81)
RDW: 14.5 % (ref 11.5–15.5)
WBC: 7.6 10*3/uL (ref 4.0–10.5)
nRBC: 0 % (ref 0.0–0.2)

## 2023-09-04 MED ORDER — PROCHLORPERAZINE EDISYLATE 10 MG/2ML IJ SOLN
10.0000 mg | Freq: Once | INTRAMUSCULAR | Status: AC
  Administered 2023-09-04: 10 mg via INTRAVENOUS
  Filled 2023-09-04: qty 2

## 2023-09-04 MED ORDER — KETOROLAC TROMETHAMINE 15 MG/ML IJ SOLN
15.0000 mg | Freq: Once | INTRAMUSCULAR | Status: AC
  Administered 2023-09-04: 15 mg via INTRAVENOUS
  Filled 2023-09-04: qty 1

## 2023-09-04 NOTE — ED Notes (Signed)
[facility] called for transport  to address on file.

## 2023-09-04 NOTE — ED Provider Notes (Signed)
 [place] [facility]ERGENC[facility] DEPARTMENT AT [facility] Provider Note  CSN: [id number] Arrival date & time: 09/04/23 0104  Chief Complaint(s) Generalized Body Aches  HPI Keith [name] is a 58 y.o. male here with generalized body aches and headache following dialysis.  Patient reports history of chronic pain treated with 20 mg of oxycodone .  Reports that he typically has worsening pain after dialysis and usually sleeps most of the day following.  Usually patient reaches out to family in the afternoon.  When family did not hear from him, they went to this house and found him lying in bed minimally responsive.  He was complaining of generalized pain.  They noted that he was breathing hard prompting a call to EMS.    No recent fevers or infections.  No nausea or vomiting.  Patient denied any illicit drug use.  Reports that he takes 20 mg of oxycodone  5 times per day.  HPI  Past Medical History Past Medical History:  Diagnosis Date   Abscess of right foot    abscess/ulcer of R transtibial amputation requiring IV abx   Acute ST elevation myocardial infarction (STEMI) due to occlusion of circumflex coronary artery (HCC) 03/22/2020   AIDS (HCC)    Anemia    CAD (coronary artery disease)    a. MI with stenting of OM1 in 11/2018 with residual disease. b. acute STEMI 03/2020 s/p DES to OM1   Chronic anemia    Chronic knee pain    right   Chronic pain    CKD (chronic kidney disease), stage IV (HCC)    CVA (cerebral vascular accident) (HCC) 04/2020   Diabetes type 2, uncontrolled    HgA1c 17.6 (04/27/2010)   Diabetic foot ulcer (HCC) 01/2017   right foot   Dilatation of aorta (HCC)    Erectile dysfunction    Genital warts    GERD (gastroesophageal reflux disease)    History of blood transfusion    HIV (human immunodeficiency virus infection) (HCC) 2009   CD4 count 100, VL 13800 (05/01/2010)   Hyperlipidemia    Hypertension    Myocardial infarction (HCC)    Neuropathy     Noncompliance with medication regimen    Osteomyelitis (HCC)    h/o hand   Osteomyelitis of metatarsal (HCC) 04/28/2017   Pneumonia    STEMI (ST elevation myocardial infarction) (HCC) 11/10/2018   Patient Active Problem List   Diagnosis Date Noted   Dialysis AV fistula malfunction (HCC) 09/04/2022   Dialysis AV fistula malfunction, initial encounter (HCC) 09/02/2022   Hyperkalemia, diminished renal excretion 09/02/2022   ESRD on dialysis (HCC) 09/02/2022   AKI (acute kidney injury) (HCC) 03/09/2022   Gallbladder anomaly 03/09/2022   Fall at home, initial encounter 12/29/2021   Acute cystitis 12/29/2021   History of anemia due to chronic kidney disease 12/29/2021   Nonketotic hyperglycinemia, type II (HCC) 12/18/2021   Asymptomatic bacteriuria 12/18/2021   Type 2 diabetes mellitus with hyperosmolar nonketotic hyperglycemia (HCC) 12/06/2021   Hyperglycemia 12/06/2021   BPH (benign prostatic hyperplasia)    GERD (gastroesophageal reflux disease)    Headache 08/22/2021   Anxiety 02/13/2021   Healthcare maintenance 02/13/2021   History of left above knee amputation (HCC) 02/07/2021   Wound dehiscence 01/03/2021   Coagulopathy (HCC) 12/22/2020   CAD (coronary artery disease) 12/22/2020   Obesity 12/22/2020   Ulcer of heel due to diabetes mellitus (HCC)    Anemia    Hyperglycemia due to diabetes mellitus (HCC) 08/29/2020   Type 2  diabetes mellitus with hyperlipidemia (HCC)    PAD (peripheral artery disease) (HCC)    Uncontrolled type 2 diabetes mellitus with hyperglycemia, with long-term current use of insulin  with renal complication 01/14/2019   Elevated glucose 01/14/2019   Hx of BKA, right (HCC) 01/14/2019   Pressure injury of skin 11/11/2018   CKD (chronic kidney disease) stage 4, GFR 15-29 ml/min (HCC) 11/10/2018   Amputation stump infection (HCC) 10/28/2018   Type II diabetes mellitus with renal manifestations (HCC) 08/07/2018   Uncontrolled type 2 diabetes mellitus with  hyperosmolar nonketotic hyperglycemia (HCC) 08/07/2018   Non-pressure chronic ulcer of right calf limited to breakdown of skin (HCC) 07/06/2018   Type 2 diabetes mellitus without complication, without long-term current use of insulin  (HCC) 06/15/2018   Chronic low back pain without sciatica 06/15/2018   Idiopathic chronic venous hypertension of left lower extremity with ulcer and inflammation (HCC)    Venous stasis ulcer of left calf limited to breakdown of skin without varicose veins (HCC) 04/23/2018   Acquired absence of right leg below knee (HCC) 06/13/2017   Diabetic polyneuropathy associated with type 2 diabetes mellitus (HCC) 01/23/2017   Diarrhea 09/28/2016   Nausea vomiting and diarrhea 09/28/2016   Onychomycosis of multiple toenails with type 2 diabetes mellitus (HCC) 08/29/2015   MRSA carrier 04/20/2014   Penile wart 02/22/2014   HIV disease (HCC)    Insulin -requiring or dependent type II diabetes mellitus (HCC) 02/04/2014   Dental anomaly 11/20/2012   Arthritis of right knee 02/23/2012   Hyperlipidemia 11/10/2011   Hyponatremia 11/10/2011   Chronic pain 08/07/2011   Meralgia paraesthetica 04/23/2011   ERECTILE DYSFUNCTION 08/22/2008   HTN (hypertension) 05/19/2008   Home Medication(s) Prior to Admission medications   Medication Sig Start Date End Date Taking? Authorizing Provider  acetaminophen  (TYLENOL ) 500 MG tablet Take 500 mg by mouth every 6 (six) hours as needed for fever, headache or moderate pain.    [provider]  albuterol  (VENTOLIN  HFA) 108 (90 Base) MCG/ACT inhaler Inhale 2 puffs into the lungs every 6 (six) hours as needed for wheezing or shortness of breath.    [provider]  allopurinol  (ZYLOPRIM ) 100 MG tablet Take 100 mg by mouth daily.    [provider]  aspirin  EC 81 MG tablet Take 81 mg by mouth daily.    [provider]  atorvastatin  (LIPITOR ) 80 MG tablet Take 1 tablet (80 mg total) by mouth daily. 09/01/20 10/12/22   Lesa Rape, MD  bictegravir-emtricitabine -tenofovir  AF (BIKTARVY ) 50-200-25 MG TABS tablet Take 1 tablet by mouth daily. Try to take at the same time each day with or without food. 07/16/23   Orlie Bjornstad, MD  calcium  acetate (PHOSLO ) 667 MG capsule Take 2 capsules (1,334 mg total) by mouth 3 (three) times daily with meals. 03/18/22   Wynetta Heckle, MD  clopidogrel  (PLAVIX ) 75 MG tablet Take 1 tablet (75 mg total) by mouth daily. 11/15/21   Lucendia Rusk, MD  dapsone  100 MG tablet Take 1 tablet (100 mg total) by mouth daily. 04/15/22   Orlie Bjornstad, MD  Dulaglutide (TRULICITY) 0.75 MG/0.5ML SOPN Inject 0.75 mg into the skin every Wednesday.    [provider]  DULoxetine  (CYMBALTA ) 60 MG capsule Take 60 mg by mouth every evening.    [provider]  escitalopram  (LEXAPRO ) 20 MG tablet Take 1 tablet (20 mg total) by mouth daily. Patient taking differently: Take 20 mg by mouth daily at 2 PM. 10/19/21   Feliciana Horn, MD  finasteride  (PROSCAR ) 5 MG tablet Take 5 mg by mouth every evening. 08/20/21   [provider]  Insulin  Glargine Solostar 300 UNIT/ML SOPN Inject 5-10 Units into the skin every evening.    [provider]  insulin  lispro (HUMALOG  KWIKPEN) 100 UNIT/ML KwikPen Inject 10 Units into the skin in the morning.    [provider]  Lancets Emmett Harman ULTRASOFT) lancets Use as instructed 02/16/20   Stroud, Natalie M, FNP  Multiple Vitamin (MULTIVITAMIN) tablet Take 1 tablet by mouth daily.    [provider]  Oxycodone  HCl 10 MG TABS Take 10 mg by mouth every 4 (four) hours as needed (pain).    [provider]  polyethylene glycol (MIRALAX ) 17 g packet Take 17 g by mouth daily as needed for mild constipation or moderate constipation. 09/05/22   Pokhrel, Laxman, MD  tamsulosin  (FLOMAX ) 0.4 MG CAPS capsule Take 1 capsule (0.4 mg total) by mouth daily after supper. Patient taking differently: Take 0.4 mg by mouth daily. 03/18/22    Wynetta Heckle, MD                                                                                                                                    Allergies Elemental sulfur, Metformin  and related, Zetia  [ezetimibe ], and Sulfa antibiotics  Review of Systems Review of Systems As noted in HPI  Physical Exam Vital Signs  I have reviewed the triage vital signs BP (!) 161/82   Pulse 82   Temp 98.1 F (36.7 C)   Resp 20   Ht 5\' 10"  (1.778 m)   Wt 117.9 kg   SpO2 100%   BMI 37.29 kg/m   Physical Exam Vitals reviewed.  Constitutional:      General: He is not in acute distress.    Appearance: He is well-developed. He is not diaphoretic.  HENT:     Head: Normocephalic and atraumatic.     Nose: Nose normal.  Eyes:     General: No scleral icterus.       Right eye: No discharge.        Left eye: No discharge.     Conjunctiva/sclera: Conjunctivae normal.     Pupils: Pupils are equal, round, and reactive to light.  Cardiovascular:     Rate and Rhythm: Normal rate and regular rhythm.     Heart sounds: No murmur heard.    No friction rub. No gallop.     Arteriovenous access: Left arteriovenous access is present. Pulmonary:     Effort: Pulmonary effort is normal. No respiratory distress.     Breath sounds: Normal breath sounds. No stridor. No rales.  Abdominal:     General: There is no distension.     Palpations: Abdomen is soft.     Tenderness: There is no abdominal tenderness.  Musculoskeletal:        General: No tenderness.     Cervical back: Normal  range of motion and neck supple.     Right Lower Extremity: Right leg is amputated below knee.     Left Lower Extremity: Left leg is amputated above knee.  Skin:    General: Skin is warm and dry.     Findings: No erythema or rash.  Neurological:     Mental Status: He is alert and oriented to person, place, and time.     ED Results and Treatments Labs (all labs ordered are listed, but only abnormal results are  displayed) Labs Reviewed  CBC WITH DIFFERENTIAL/PLATELET - Abnormal; Notable for the following components:      Result Value   RBC 4.14 (*)    Hemoglobin 12.0 (*)    HCT 38.0 (*)    All other components within normal limits  BASIC METABOLIC PANEL WITH GFR - Abnormal; Notable for the following components:   Glucose, Bld 241 (*)    BUN 22 (*)    Creatinine, Ser 4.41 (*)    Calcium  8.5 (*)    GFR, Estimated 15 (*)    All other components within normal limits  CBG MONITORING, ED                                                                                                                         EKG  EKG Interpretation Date/Time:  Thursday Sep 04 2023 01:14:02 EDT Ventricular Rate:  83 PR Interval:  171 QRS Duration:  117 QT Interval:  410 QTC Calculation: 482 R Axis:   -28  Text Interpretation: Sinus rhythm Nonspecific intraventricular conduction delay Low voltage, precordial leads Probable lateral infarct, old Confirmed by Townsend Freud [phone]) on 09/04/2023 3:23:12 AM       Radiology No results found.  Medications Ordered in ED Medications  prochlorperazine  (COMPAZINE ) injection 10 mg (10 mg Intravenous Given 09/04/23 0211)  ketorolac  (TORADOL ) 15 MG/ML injection 15 mg (15 mg Intravenous Given 09/04/23 0210)   Procedures Procedures  (including critical care time) Medical Decision Making / ED Course   Medical Decision Making Amount and/or Complexity of Data Reviewed Labs: ordered. Decision-making details documented in ED Course. ECG/medicine tests: ordered and independent interpretation performed. Decision-making details documented in ED Course.  Risk Prescription drug management.    Patient is here with headache and generalized bodyaches following dialysis.  No infectious symptoms.  Patient is now more responsive here.  Family at bedside reports that he is back to his baseline.  Labs reassuring.  Headache completely resolved following headache cocktail.     Final Clinical Impression(s) / ED Diagnoses Final diagnoses:  Body aches  Chronic nonintractable headache, unspecified headache type   The patient appears reasonably screened and/or stabilized for discharge and I doubt any other medical condition or other [facility] requiring further screening, evaluation, or treatment in the ED at this time. I have discussed the findings, Dx and Tx plan with the patient/family who expressed understanding and agree(s) with the plan. Discharge instructions discussed at length. The patient/family was  given strict return precautions who verbalized understanding of the instructions. No further questions at time of discharge.  Disposition: Discharge  Condition: Good  ED Discharge Orders     None         Follow Up: [facility] [address] [place] [address] [phone]  Call  to schedule an appointment for close follow up    This chart was dictated using voice recognition software.  Despite best efforts to proofread,  errors can occur which can change the documentation meaning.    Lindle Rhea, MD 09/04/23 [phone]

## 2023-09-04 NOTE — ED Triage Notes (Signed)
 Pt BIB EMS from home with c/o left arm pain and weakness. Fistula placed x 4 months ago, dialysis yesterday. Family was unable to make contact with pt so they had to crawl through a window and found him moaning and groaning in pain.   fentanyl  with ems 4mg  zofran   20R hand  190/100 80HR 99RA 190CBG

## 2023-09-05 ENCOUNTER — Telehealth: Payer: Self-pay

## 2023-09-08 ENCOUNTER — Telehealth: Payer: Self-pay

## 2023-09-08 NOTE — Telephone Encounter (Signed)
 Error

## 2023-09-08 NOTE — Transitions of Care (Post Inpatient/ED Visit) (Signed)
   09/08/2023  Name: Keith [name] MRN: [id number] DOB: 12-Aug-1965  Today's TOC FU Call Status: Today's TOC FU Call Status:: Unsuccessful Call (1st Attempt) Unsuccessful Call (1st Attempt) Date: 09/08/23  Attempted to reach the patient regarding the most recent Inpatient/ED visit.  Follow Up Plan: Additional outreach attempts will be made to reach the patient to complete the Transitions of Care (Post Inpatient/ED visit) call.   Signature  [employer], [place]

## 2023-10-01 ENCOUNTER — Ambulatory Visit: Payer: Medicare (Managed Care) | Admitting: "Endocrinology

## 2023-10-16 DIAGNOSIS — M129 Arthropathy, unspecified: Secondary | ICD-10-CM | POA: Diagnosis not present

## 2023-10-16 DIAGNOSIS — Z79899 Other long term (current) drug therapy: Secondary | ICD-10-CM | POA: Diagnosis not present

## 2023-10-16 DIAGNOSIS — R5383 Other fatigue: Secondary | ICD-10-CM | POA: Diagnosis not present

## 2023-10-16 DIAGNOSIS — E559 Vitamin D deficiency, unspecified: Secondary | ICD-10-CM | POA: Diagnosis not present

## 2023-10-16 DIAGNOSIS — R0602 Shortness of breath: Secondary | ICD-10-CM | POA: Diagnosis not present

## 2023-10-16 DIAGNOSIS — G546 Phantom limb syndrome with pain: Secondary | ICD-10-CM | POA: Diagnosis not present

## 2023-10-16 DIAGNOSIS — Z0000 Encounter for general adult medical examination without abnormal findings: Secondary | ICD-10-CM | POA: Diagnosis not present

## 2023-10-16 DIAGNOSIS — E7800 Pure hypercholesterolemia, unspecified: Secondary | ICD-10-CM | POA: Diagnosis not present

## 2023-10-17 ENCOUNTER — Ambulatory Visit: Payer: Self-pay

## 2023-10-17 NOTE — Telephone Encounter (Signed)
 FYI Only or Action Required?: Action required by provider: request for appointment.  Patient was last seen in primary care on .  Called Nurse Triage reporting Diabetes.  Symptoms began several weeks ago.  Interventions attempted: Prescription medications: Humalog  and Lantus  .  Symptoms are: stable.  Triage Disposition: No disposition on file.  Patient/caregiver understands and will follow disposition?:   Pt had to get off call d/t bus or sign other was there to pick him up. Pt wanting sooner appt, advised him to call back when he has more time to discuss.   Copied from CRM [id number]. Topic: Clinical - Red Word Triage >> Oct 17, 2023  8:51 AM Vena H wrote: Red Word that prompted transfer to Nurse Triage: Pt will be establishing care at [facility] on 8/19 but states when he takes his insulin  his glucose drops and he gets lightheaded. Answer Assessment - Initial Assessment Questions 1. SYMPTOMS: What symptoms are you concerned about?     After taking insulins CBG drops and gets lightheaded and weak 2. ONSET:  When did the symptoms start?     2-3 weeks  3. BLOOD GLUCOSE: What is your blood glucose level?      Pt went to HD yesterday and CBG was in 50s per pt  5. TYPE 1 or 2:  Do you know what type of diabetes you have?  (e.g., Type 1, Type 2, Gestational; doesn't know)      DM 2  6. INSULIN : Do you take insulin ? What type of insulin (s) do you use? What is the mode of delivery? (syringe, pen; injection or pump) When did you last give yourself an insulin  dose? (i.e., time or hours/minutes ago) How much did you give? (i.e., how many units)     Humalog  and Lantus   8. OTHER SYMPTOMS: Do you have any symptoms? (e.g., fever, frequent urination, difficulty breathing, vomiting)     no 9. LOW BLOOD GLUCOSE TREATMENT: What have you done so far to treat the low blood glucose level?     Was given chocolate yesterday at HD and CBG increased   Pt wanting sooner appt to be seen for DM  medications and sx.  Protocols used: Diabetes - Low Blood Sugar-A-AH

## 2023-11-06 ENCOUNTER — Ambulatory Visit: Payer: Medicare (Managed Care) | Admitting: "Endocrinology

## 2023-11-06 DIAGNOSIS — I10 Essential (primary) hypertension: Secondary | ICD-10-CM | POA: Diagnosis not present

## 2023-11-06 DIAGNOSIS — E1129 Type 2 diabetes mellitus with other diabetic kidney complication: Secondary | ICD-10-CM | POA: Diagnosis not present

## 2023-11-06 DIAGNOSIS — R030 Elevated blood-pressure reading, without diagnosis of hypertension: Secondary | ICD-10-CM | POA: Diagnosis not present

## 2023-11-06 DIAGNOSIS — E7800 Pure hypercholesterolemia, unspecified: Secondary | ICD-10-CM | POA: Diagnosis not present

## 2023-11-06 DIAGNOSIS — N186 End stage renal disease: Secondary | ICD-10-CM | POA: Diagnosis not present

## 2023-11-06 DIAGNOSIS — M545 Low back pain, unspecified: Secondary | ICD-10-CM | POA: Diagnosis not present

## 2023-11-06 DIAGNOSIS — G546 Phantom limb syndrome with pain: Secondary | ICD-10-CM | POA: Diagnosis not present

## 2023-11-06 DIAGNOSIS — G894 Chronic pain syndrome: Secondary | ICD-10-CM | POA: Diagnosis not present

## 2023-11-06 DIAGNOSIS — Z992 Dependence on renal dialysis: Secondary | ICD-10-CM | POA: Diagnosis not present

## 2023-11-16 DIAGNOSIS — I2510 Atherosclerotic heart disease of native coronary artery without angina pectoris: Secondary | ICD-10-CM | POA: Diagnosis not present

## 2023-11-16 DIAGNOSIS — D689 Coagulation defect, unspecified: Secondary | ICD-10-CM | POA: Diagnosis not present

## 2023-11-25 ENCOUNTER — Encounter: Payer: Self-pay | Admitting: Nurse Practitioner

## 2023-11-25 ENCOUNTER — Ambulatory Visit (INDEPENDENT_AMBULATORY_CARE_PROVIDER_SITE_OTHER): Payer: Self-pay | Admitting: Nurse Practitioner

## 2023-11-25 VITALS — BP 119/65 | HR 77 | Temp 97.2°F

## 2023-11-25 DIAGNOSIS — Z794 Long term (current) use of insulin: Secondary | ICD-10-CM | POA: Diagnosis not present

## 2023-11-25 DIAGNOSIS — Z7409 Other reduced mobility: Secondary | ICD-10-CM | POA: Diagnosis not present

## 2023-11-25 DIAGNOSIS — Z789 Other specified health status: Secondary | ICD-10-CM

## 2023-11-25 DIAGNOSIS — Z89511 Acquired absence of right leg below knee: Secondary | ICD-10-CM | POA: Diagnosis not present

## 2023-11-25 DIAGNOSIS — K219 Gastro-esophageal reflux disease without esophagitis: Secondary | ICD-10-CM

## 2023-11-25 DIAGNOSIS — E785 Hyperlipidemia, unspecified: Secondary | ICD-10-CM

## 2023-11-25 DIAGNOSIS — Z992 Dependence on renal dialysis: Secondary | ICD-10-CM

## 2023-11-25 DIAGNOSIS — N401 Enlarged prostate with lower urinary tract symptoms: Secondary | ICD-10-CM

## 2023-11-25 DIAGNOSIS — E1165 Type 2 diabetes mellitus with hyperglycemia: Secondary | ICD-10-CM

## 2023-11-25 DIAGNOSIS — N186 End stage renal disease: Secondary | ICD-10-CM

## 2023-11-25 DIAGNOSIS — I2510 Atherosclerotic heart disease of native coronary artery without angina pectoris: Secondary | ICD-10-CM

## 2023-11-25 DIAGNOSIS — J45909 Unspecified asthma, uncomplicated: Secondary | ICD-10-CM | POA: Insufficient documentation

## 2023-11-25 DIAGNOSIS — I739 Peripheral vascular disease, unspecified: Secondary | ICD-10-CM | POA: Diagnosis not present

## 2023-11-25 DIAGNOSIS — D509 Iron deficiency anemia, unspecified: Secondary | ICD-10-CM

## 2023-11-25 DIAGNOSIS — B20 Human immunodeficiency virus [HIV] disease: Secondary | ICD-10-CM

## 2023-11-25 DIAGNOSIS — Z89612 Acquired absence of left leg above knee: Secondary | ICD-10-CM

## 2023-11-25 DIAGNOSIS — F419 Anxiety disorder, unspecified: Secondary | ICD-10-CM

## 2023-11-25 DIAGNOSIS — N22 Calculus of urinary tract in diseases classified elsewhere: Secondary | ICD-10-CM

## 2023-11-25 LAB — POCT GLYCOSYLATED HEMOGLOBIN (HGB A1C)
HbA1c POC (<> result, manual entry): 7.5 % (ref 4.0–5.6)
Hemoglobin A1C: 7.5 % — AB (ref 4.0–5.6)

## 2023-11-25 MED ORDER — CALCIUM ACETATE (PHOS BINDER) 667 MG PO CAPS: 1334.0000 mg | ORAL_CAPSULE | Freq: Three times a day (TID) | ORAL | Status: DC

## 2023-11-25 MED ORDER — TAMSULOSIN HCL 0.4 MG PO CAPS: 0.4000 mg | ORAL_CAPSULE | Freq: Every day | ORAL | 1 refills | Status: DC

## 2023-11-25 MED ORDER — TRULICITY 4.5 MG/0.5ML ~~LOC~~ SOAJ: 4.5000 mg | SUBCUTANEOUS | 3 refills | Status: DC

## 2023-11-25 MED ORDER — BLOOD GLUCOSE TEST VI STRP: 1.0000 | ORAL_STRIP | Freq: Three times a day (TID) | 3 refills | Status: DC

## 2023-11-25 MED ORDER — LANCET DEVICE MISC: 1.0000 | Freq: Three times a day (TID) | 0 refills | Status: DC

## 2023-11-25 MED ORDER — FREESTYLE LIBRE 3 PLUS SENSOR MISC: 2 refills | Status: DC

## 2023-11-25 MED ORDER — FAMOTIDINE 10 MG PO TABS: 10.0000 mg | ORAL_TABLET | Freq: Two times a day (BID) | ORAL | 1 refills | Status: DC | PRN

## 2023-11-25 MED ORDER — FREESTYLE LIBRE 3 READER DEVI: 1.0000 | 0 refills | Status: DC

## 2023-11-25 MED ORDER — BLOOD GLUCOSE MONITORING SUPPL DEVI: 1.0000 | Freq: Three times a day (TID) | 0 refills | Status: DC

## 2023-11-25 MED ORDER — ATORVASTATIN CALCIUM 80 MG PO TABS
80.0000 mg | ORAL_TABLET | Freq: Every day | ORAL | 1 refills | Status: DC
Start: 2023-11-25 — End: 2023-11-25

## 2023-11-25 MED ORDER — CLOPIDOGREL BISULFATE 75 MG PO TABS: 75.0000 mg | ORAL_TABLET | Freq: Every day | ORAL | 3 refills | Status: DC

## 2023-11-25 MED ORDER — ALLOPURINOL 100 MG PO TABS: 100.0000 mg | ORAL_TABLET | Freq: Every day | ORAL | 0 refills | Status: DC

## 2023-11-25 MED ORDER — ROSUVASTATIN CALCIUM 40 MG PO TABS: 40.0000 mg | ORAL_TABLET | Freq: Every day | ORAL | 3 refills | Status: DC

## 2023-11-25 MED ORDER — ALBUTEROL SULFATE HFA 108 (90 BASE) MCG/ACT IN AERS: 2.0000 | INHALATION_SPRAY | Freq: Four times a day (QID) | RESPIRATORY_TRACT | 0 refills | Status: DC | PRN

## 2023-11-25 MED ORDER — FINASTERIDE 5 MG PO TABS: 5.0000 mg | ORAL_TABLET | Freq: Every evening | ORAL | 1 refills | Status: DC

## 2023-11-25 MED ORDER — LANCETS MISC. MISC: 1.0000 | Freq: Three times a day (TID) | 3 refills | Status: DC

## 2023-11-25 NOTE — Assessment & Plan Note (Addendum)
Continue Plavix 75 mg daily  

## 2023-11-25 NOTE — Assessment & Plan Note (Signed)
 Chronic He needs a power chair for mobility

## 2023-11-25 NOTE — Assessment & Plan Note (Signed)
 Reported history of Chronic obstructive pulmonary disease and asthma Uses albuterol  inhaler as needed.

## 2023-11-25 NOTE — Assessment & Plan Note (Signed)
 Type 2 diabetes mellitus, insulin  dependent Insulin  regimen includes 20 units of short-acting insulin  in the morning and 20 units of long-acting insulin  at bedtime. Reports hypoglycemia post-dialysis. A1c is 7.5%, goal is less than 7%. - Order Freestyle Libre for continuous glucose monitoring. - Order glucometer for home use. - Contacted [facility] to confirm current insulin  regimen. - Advise to delay insulin  administration until after dialysis sessions to prevent hypoglycemia. Trulicity  4.5 mg once weekly refilled stated that he had been out of medication for about 2 weeks

## 2023-11-25 NOTE — Assessment & Plan Note (Signed)
    11/25/2023    1:59 PM  GAD 7 : Generalized Anxiety Score  Nervous, Anxious, on Edge 1  Control/stop worrying 3  Worry too much - different things 3  Trouble relaxing 1  Restless 3  Easily annoyed or irritable 2  Afraid - awful might happen 3  Total GAD 7 Score 16  Anxiety Difficulty Not difficult at all   Taking Lexapro  20 mg daily.,  Duloxetine  60 mg daily  Informed him that Xanax  is not prescribed at this practice. Consider referral to psychiatrist

## 2023-11-25 NOTE — Assessment & Plan Note (Signed)
 Continue Biktarvy  and dapsone  100 mg daily Maintain close follow-up with infectious disease specialist

## 2023-11-25 NOTE — Assessment & Plan Note (Signed)
 He needs a power chair for mobility

## 2023-11-25 NOTE — Assessment & Plan Note (Signed)
 Needs assistance with activities of daily living Requires assistance with bathing due to fear of falling. Uses a shower chair but feels unsafe. Has no medicated PCS form completed  Mobility impairment, requires wheelchair Current wheelchair is broken. Requests an electric wheelchair. - Order for electric wheelchair will be faxed to [facility]hab [facility] for processing

## 2023-11-25 NOTE — Patient Instructions (Addendum)
 Goal for fasting blood sugar ranges from 80 to 120 and 2 hours after any meal or at bedtime should be between 130 to 170.     Thanks for choosing [facility] we consider it a privelige to serve you.

## 2023-11-25 NOTE — Assessment & Plan Note (Addendum)
 Lab Results  Component Value Date   CHOL 175 08/17/2021   HDL 45 08/17/2021   LDLCALC 105 (H) 08/17/2021   TRIG 134 08/17/2021   CHOLHDL 3.9 08/17/2021    Taking Crestor  40 mg daily and fish oil  last cholesterol check is unknown. - Advise fasting for cholesterol check at next visit.

## 2023-11-25 NOTE — Assessment & Plan Note (Signed)
  On hemodialysis three times a week and anuric. Reports hypoglycemia post-dialysis. - Advise to delay insulin  administration until after dialysis sessions to prevent hypoglycemia.

## 2023-11-25 NOTE — Progress Notes (Signed)
 "  New Patient Office Visit  Subjective:  [name], male    DOB: 04-Jan-1966  Age: 58 y.o. MRN: [id number]  CC:  Chief Complaint  Patient presents with   Establish Care    Discussed the use of AI scribe software for clinical note transcription with the patient, who gave verbal consent to proceed.  History of Present Illness Keith [name] is a 58 year old male  has a past medical history of Abscess of right foot, Acute ST elevation myocardial infarction (STEMI) due to occlusion of circumflex coronary artery (HCC) (03/22/2020), AIDS (HCC), Anemia, CAD (coronary artery disease), Chronic anemia, Chronic knee pain, Chronic pain, CKD (chronic kidney disease), stage IV (HCC), CVA (cerebral vascular accident) (HCC) (04/2020), Diabetes type 2, uncontrolled, Diabetic foot ulcer (HCC) (01/2017), Dilatation of aorta (HCC), Erectile dysfunction, Genital warts, GERD (gastroesophageal reflux disease), History of blood transfusion, HIV (human immunodeficiency virus infection) (HCC) (2009), Hyperlipidemia, Hypertension, Myocardial infarction (HCC), Neuropathy, Noncompliance with medication regimen, Osteomyelitis (HCC), Osteomyelitis of metatarsal (HCC) (04/28/2017), Pneumonia, and STEMI (ST elevation myocardial infarction) (HCC) (11/10/2018).who presents for medication management and a request for a wheelchair.  He has end-stage renal disease and undergoes dialysis three times a week on Monday, Wednesday, and Friday. He no longer produces urine and has a fistula in his left upper arm. He reports that his blood sugar drops after dialysis sessions, even though he eats a sandwich beforehand.  He has a history of diabetes and is currently taking Trulicity , although he is unsure of the dosage and has run out of the medication. He also takes insulin , specifically 20 units of short-acting insulin  in the morning and 20 units of long-acting insulin  at night. He does not currently have a glucometer to monitor  his blood sugar levels.  He has chronic obstructive pulmonary disease (COPD) and uses an albuterol  inhaler as needed. He also has a history of asthma.  He is on multiple medications including Tylenol  as needed, allopurinol  100 mg daily for gout, atorvastatin  80 mg daily for cholesterol, Biktarvy  once daily, calcium  acetate three times with meals, Plavix  75 mg daily, duloxetine  60 mg every evening, Lexapro  20 mg daily, Proscar  5 mg, and oxycodone  10 mg every five hours as needed for pain. He also takes Miralax  as needed and Flomax .  Has RBKA, LAKA He needs an electric wheelchair as his current one is broken. He also requires a home aide to assist with bathing due to fear of falling. Wants to be getting home health services from [facility]. [address] [place] [address].  He has not seen an eye doctor in the past twelve months. He lives alone and has been recently discharged from [facility] after a nine-month stay following hospitalization and nursing home care.  No fever, chills, chest pain, or shortness of breath. Med list from [facility] obtained and reviewed    Past Medical History:  Diagnosis Date   Abscess of right foot    abscess/ulcer of R transtibial amputation requiring IV abx   Acute ST elevation myocardial infarction (STEMI) due to occlusion of circumflex coronary artery (HCC) 03/22/2020   AIDS (HCC)    Anemia    CAD (coronary artery disease)    a. MI with stenting of OM1 in 11/2018 with residual disease. b. acute STEMI 03/2020 s/p DES to OM1   Chronic anemia    Chronic knee pain    right   Chronic pain    CKD (chronic kidney disease), stage IV (  HCC)    CVA (cerebral vascular accident) (HCC) 04/2020   Diabetes type 2, uncontrolled    HgA1c 17.6 (04/27/2010)   Diabetic foot ulcer (HCC) 01/2017   right foot   Dilatation of aorta (HCC)    Erectile dysfunction    Genital warts    GERD (gastroesophageal reflux disease)    History of blood transfusion    HIV  (human immunodeficiency virus infection) (HCC) 2009   CD4 count 100, VL 13800 (05/01/2010)   Hyperlipidemia    Hypertension    Myocardial infarction [facility])    Neuropathy    Noncompliance with medication regimen    Osteomyelitis (HCC)    h/o hand   Osteomyelitis of metatarsal (HCC) 04/28/2017   Pneumonia    STEMI (ST elevation myocardial infarction) (HCC) 11/10/2018    Past Surgical History:  Procedure Laterality Date   A/V FISTULAGRAM N/A 05/07/2023   Procedure: A/V Fistulagram;  Surgeon: Melia Lynwood ORN, MD;  [facility] IN[facility];  Service: Cardiovascular;  Laterality: N/A;   AMPUTATION Right 05/02/2017   Procedure: AMPUTATION TRANSMETARSAL;  Surgeon: Harden Jerona GAILS, MD;  Location: [facility] OR;  Service: Orthopedics;  Laterality: Right;   AMPUTATION Right 06/13/2017   Procedure: RIGHT BELOW KNEE AMPUTATION;  Surgeon: Harden Jerona GAILS, MD;  Location: [facility] OR;  Service: Orthopedics;  Laterality: Right;   AMPUTATION Left 10/27/2020   Procedure: LEFT BELOW KNEE AMPUTATION;  Surgeon: Harden Jerona GAILS, MD;  Location: [facility] OR;  Service: Orthopedics;  Laterality: Left;   AMPUTATION Left 11/17/2020   Procedure: REVISION AMPUTATION BELOW KNEE, LEFT;  Surgeon: Harden Jerona GAILS, MD;  Location: [facility] OR;  Service: Orthopedics;  Laterality: Left;   AMPUTATION Left 01/03/2021   Procedure: LEFT ABOVE KNEE AMPUTATION;  Surgeon: Harden Jerona GAILS, MD;  Location: [facility] OR;  Service: Orthopedics;  Laterality: Left;   APPLICATION OF WOUND VAC Left 10/27/2020   Procedure: APPLICATION OF WOUND VAC;  Surgeon: Harden Jerona GAILS, MD;  Location: [facility] OR;  Service: Orthopedics;  Laterality: Left;   AV FISTULA PLACEMENT Left 03/14/2022   Procedure: LEFT BRACHIOCEPHALIC ARTERIOVENOUS (AV) FISTULA CREATION;  Surgeon: Lanis Fonda BRAVO, MD;  Location: [facility] OR;  Service: Vascular;  Laterality: Left;  PERIPHERAL NERVE BLOCK   BELOW KNEE LEG AMPUTATION Right 06/13/2017   BELOW KNEE LEG AMPUTATION     CORONARY STENT INTERVENTION N/A 11/10/2018   Procedure:  CORONARY STENT INTERVENTION;  Surgeon: Anner Alm ORN, MD;  Location: [facility] INVASIVE CV LAB;  Service: Cardiovascular;  Laterality: N/A;   CORONARY/GRAFT ACUTE MI REVASCULARIZATION N/A 03/21/2020   Procedure: Coronary/Graft Acute MI Revascularization;  Surgeon: Court Dorn PARAS, MD;  [facility];  Service: Cardiovascular;  Laterality: N/A;   CYSTOSCOPY N/A 11/09/2020   Procedure: CYSTOSCOPY, EVACUATION OF CLOTS, FULGERATION OF BLADDER AND BILATERIAL RETROGRADE PYELOGRAMS;  Surgeon: Selma Donnice SAUNDERS, MD;  Location: [facility] OR;  Service: Urology;  Laterality: N/A;   HERNIA REPAIR     I & D EXTREMITY Left 08/21/2014   Procedure: INCISION AND DRAINAGE LEFT SMALL FINGER;  Surgeon: Franky Curia, MD;  [facility] OR;  Service: Orthopedics;  Laterality: Left;   I & D EXTREMITY Right 03/18/2017   Procedure: IRRIGATION AND DEBRIDEMENT EXTREMITY;  Surgeon: Harden Jerona GAILS, MD;  Location: [facility] OR;  Service: Orthopedics;  Laterality: Right;   IR FLUORO GUIDE CV LINE RIGHT  11/02/2020   IR FLUORO GUIDE CV LINE RIGHT  03/12/2022   IR FLUORO GUIDE CV LINE RIGHT  09/03/2022   IR FLUORO  RM 30-60 MIN  09/04/2022   IR REMOVAL TUN CV CATH W/O FL  11/13/2020   IR US  GUIDE VASC ACCESS RIGHT  11/02/2020   IR US  GUIDE VASC ACCESS RIGHT  03/12/2022   IR US  GUIDE VASC ACCESS RIGHT  09/03/2022   IR VENOCAVAGRAM SVC  03/12/2022   LEFT HEART CATH AND CORONARY ANGIOGRAPHY N/A 11/10/2018   Procedure: LEFT HEART CATH AND CORONARY ANGIOGRAPHY;  Surgeon: Anner Alm ORN, MD;  Location: [facility] INVASIVE CV LAB;  Service: Cardiovascular;  Laterality: N/A;   LEFT HEART CATH AND CORONARY ANGIOGRAPHY N/A 03/21/2020   Procedure: LEFT HEART CATH AND CORONARY ANGIOGRAPHY;  Surgeon: Court Dorn PARAS, MD;  [facility];  Service: Cardiovascular;  Laterality: N/A;   MINOR IRRIGATION AND DEBRIDEMENT OF WOUND Right 04/22/2014   Procedure: IRRIGATION AND DEBRIDEMENT OF RIGHT NECK ABCESS;  Surgeon: Alm Bouche, MD;  Location: [facility] OR;   Service: ENT;  Laterality: Right;   MULTIPLE EXTRACTIONS WITH ALVEOLOPLASTY N/A 01/18/2013   Procedure: MULTIPLE EXTRACION 3, 6, 7, 10, 11, 13, 21, 22, 27, 28, 29, 30 WITH ALVEOLOPLASTY;  Surgeon: Glendia CHRISTELLA Primrose, DDS;  [facility] OR;  Service: Oral Surgery;  Laterality: N/A;   SKIN SPLIT GRAFT Right 03/21/2017   Procedure: IRRIGATION AND DEBRIDEMENT RIGHT FOOT AND APPLY SPLIT THICKNESS SKIN GRAFT AND WOUND VAC;  Surgeon: Harden Jerona GAILS, MD;  Location: [facility] OR;  Service: Orthopedics;  Laterality: Right;   STUMP REVISION Left 12/02/2020   Procedure: REVISION LEFT BELOW KNEE AMPUTATION;  Surgeon: Harden Jerona GAILS, MD;  Location: [facility] OR;  Service: Orthopedics;  Laterality: Left;   TEE WITHOUT CARDIOVERSION N/A 05/02/2017   Procedure: TRANSESOPHAGEAL ECHOCARDIOGRAM (TEE);  Surgeon: Alveta, Aleene PARAS, MD;  Location: [facility] OR;  Service: Cardiovascular;  Laterality: N/A;  coincidental to orthopedic case    Family History  Problem Relation Age of Onset   Hypertension Mother    Arthritis Father    Hypertension Father    Hypertension Brother    Cancer Maternal Grandmother 46       unknown type of cancer   Depression Paternal Grandmother     Social History   Socioeconomic History   Marital status: Single    Spouse name: Not on file   Number of children: 3   Years of education: 12   Highest education level: Not on file  Occupational History   Occupation: disabled  Tobacco Use   Smoking status: Never    Passive exposure: Never   Smokeless tobacco: Never  Vaping Use   Vaping status: Never Used  Substance and Sexual Activity   Alcohol  use: Not Currently   Drug use: Never    Comment: OTC ASA   Sexual activity: Yes    Partners: Female    Comment: declined condoms  Other Topics Concern   Not on file  Social History Narrative   Lives home alone    Social Drivers of Health   Financial Resource Strain: Low Risk  (01/10/2021)   Overall Financial Resource Strain (CARDIA)    Difficulty of Paying  Living Expenses: Not very hard  Food Insecurity: No Food Insecurity (12/29/2021)   Hunger Vital Sign    Worried About Running Out of Food in the Last Year: Never true    Ran Out of Food in the Last Year: Never true  Transportation Needs: No Transportation Needs (12/29/2021)   PRAPARE - Administrator, Civil Service (Medical): No    Lack of Transportation (Non-Medical): No  Physical Activity: Not on file  Stress: No Stress Concern Present (01/10/2021)   [employer] of Occupational Health - Occupational Stress Questionnaire    Feeling of Stress : Only a little  Social Connections: Not on file  Intimate Partner Violence: Not At Risk (12/29/2021)   Humiliation, Afraid, Rape, and Kick questionnaire    Fear of Current or Ex-Partner: No    Emotionally Abused: No    Physically Abused: No    Sexually Abused: No    ROS Review of Systems  Constitutional:  Negative for appetite change, chills, fatigue and fever.  HENT:  Negative for congestion, postnasal drip, rhinorrhea and sneezing.   Respiratory:  Negative for cough, shortness of breath and wheezing.   Cardiovascular:  Negative for chest pain, palpitations and leg swelling.  Gastrointestinal:  Negative for abdominal pain, constipation, nausea and vomiting.  Genitourinary:  Negative for difficulty urinating, dysuria, flank pain and frequency.  Musculoskeletal:  Negative for arthralgias, back pain, joint swelling and myalgias.  Skin:  Negative for color change, pallor, rash and wound.  Neurological:  Negative for dizziness, facial asymmetry, weakness, numbness and headaches.  Psychiatric/Behavioral:  Negative for behavioral problems, confusion, self-injury and suicidal ideas.     Objective:   Today's Vitals: BP 119/65   Pulse 77   Temp (!) 97.2 F (36.2 C)   SpO2 100%   Physical Exam Vitals and nursing note reviewed.  Constitutional:      General: He is not in acute distress.    Appearance: Normal appearance. He is  obese. He is not ill-appearing, toxic-appearing or diaphoretic.  Cardiovascular:     Rate and Rhythm: Normal rate and regular rhythm.     Pulses: Normal pulses.     Heart sounds: Normal heart sounds. No murmur heard.    No friction rub. No gallop.  Pulmonary:     Effort: Pulmonary effort is normal. No respiratory distress.     Breath sounds: Normal breath sounds. No stridor. No wheezing, rhonchi or rales.  Chest:     Chest wall: No tenderness.  Abdominal:     General: There is no distension.     Palpations: Abdomen is soft.     Tenderness: There is no abdominal tenderness. There is no right CVA tenderness, left CVA tenderness or guarding.  Musculoskeletal:        General: No swelling, tenderness, deformity or signs of injury.     Comments: RBKA, LAKA  Using a wheelchair  Skin:    General: Skin is warm and dry.     Capillary Refill: Capillary refill takes less than 2 seconds.     Coloration: Skin is not jaundiced or pale.     Findings: No bruising, erythema or lesion.  Neurological:     Mental Status: He is alert and oriented to person, place, and time.     Motor: No weakness.  Psychiatric:        Mood and Affect: Mood normal.        Behavior: Behavior normal.        Thought Content: Thought content normal.        Judgment: Judgment normal.     Assessment & Plan:   Assessment and Plan Assessment & Plan            Problem List Items Addressed This Visit       Cardiovascular and Mediastinum   PAD (peripheral artery disease) (HCC) - Primary   Relevant Medications   rosuvastatin  (CRESTOR ) 40 MG tablet  midodrine  (PROAMATINE ) 10 MG tablet   CAD (coronary artery disease)   Continue Plavix  75 mg daily       Relevant Medications   rosuvastatin  (CRESTOR ) 40 MG tablet   midodrine  (PROAMATINE ) 10 MG tablet     Respiratory   Asthma   Reported history of Chronic obstructive pulmonary disease and asthma Uses albuterol  inhaler as needed.      Relevant  Medications   albuterol  (VENTOLIN  HFA) 108 (90 Base) MCG/ACT inhaler     Digestive   GERD (gastroesophageal reflux disease)   Relevant Medications   calcium  acetate (PHOSLO ) 667 MG capsule   famotidine  (PEPCID ) 10 MG tablet     Endocrine   Uncontrolled type 2 diabetes mellitus with hyperglycemia, with long-term current use of insulin  with renal complication   Type 2 diabetes mellitus, insulin  dependent Insulin  regimen includes 20 units of short-acting insulin  in the morning and 20 units of long-acting insulin  at bedtime. Reports hypoglycemia post-dialysis. A1c is 7.5%, goal is less than 7%. - Order Freestyle Libre for continuous glucose monitoring. - Order glucometer for home use. - Contacted [facility] to confirm current insulin  regimen. - Advise to delay insulin  administration until after dialysis sessions to prevent hypoglycemia. Trulicity  4.5 mg once weekly refilled stated that he had been out of medication for about 2 weeks      Relevant Medications   Blood Glucose Monitoring Suppl DEVI   Glucose Blood (BLOOD GLUCOSE TEST STRIPS) STRP   Lancet Device MISC   Lancets Misc. MISC   Continuous Glucose Sensor (FREESTYLE LIBRE 3 PLUS SENSOR) MISC   Continuous Glucose Receiver (FREESTYLE LIBRE 3 READER) DEVI   Dulaglutide  (TRULICITY ) 4.5 MG/0.5ML SOAJ   rosuvastatin  (CRESTOR ) 40 MG tablet   Other Relevant Orders   Ambulatory referral to Ophthalmology   AMB Referral [facility]I Care Management   POCT glycosylated hemoglobin (Hb A1C) (Completed)     Genitourinary   ESRD on dialysis (HCC) (Chronic)    On hemodialysis three times a week and anuric. Reports hypoglycemia post-dialysis. - Advise to delay insulin  administration until after dialysis sessions to prevent hypoglycemia.      BPH (benign prostatic hyperplasia)   Relevant Medications   finasteride  (PROSCAR ) 5 MG tablet   tamsulosin  (FLOMAX ) 0.4 MG CAPS capsule   Calculus of urinary tract in diseases classified elsewhere   Relevant  Medications   allopurinol  (ZYLOPRIM ) 100 MG tablet   tamsulosin  (FLOMAX ) 0.4 MG CAPS capsule     Other   Hyperlipidemia (Chronic)   Lab Results  Component Value Date   CHOL 175 08/17/2021   HDL 45 08/17/2021   LDLCALC 105 (H) 08/17/2021   TRIG 134 08/17/2021   CHOLHDL 3.9 08/17/2021    Taking Crestor  40 mg daily and fish oil  last cholesterol check is unknown. - Advise fasting for cholesterol check at next visit.      Relevant Medications   rosuvastatin  (CRESTOR ) 40 MG tablet   midodrine  (PROAMATINE ) 10 MG tablet   HIV disease (HCC) (Chronic)   Continue Biktarvy  and dapsone  100 mg daily Maintain close follow-up with infectious disease specialist      Hx of BKA, right (HCC)   He needs a power chair for mobility      History of left above knee amputation (HCC)   Chronic He needs a power chair for mobility      Anxiety      11/25/2023    1:59 PM  GAD 7 : Generalized Anxiety Score  Nervous, Anxious, on Edge 1  Control/stop worrying 3  Worry too much - different things 3  Trouble relaxing 1  Restless 3  Easily annoyed or irritable 2  Afraid - awful might happen 3  Total GAD 7 Score 16  Anxiety Difficulty Not difficult at all   Taking Lexapro  20 mg daily.,  Duloxetine  60 mg daily  Informed him that Xanax  is not prescribed at this practice. Consider referral to psychiatrist        Impaired mobility and activities of daily living   Needs assistance with activities of daily living Requires assistance with bathing due to fear of falling. Uses a shower chair but feels unsafe. Has no medicated PCS form completed  Mobility impairment, requires wheelchair Current wheelchair is broken. Requests an electric wheelchair. - Order for electric wheelchair will be faxed to rehab medical for processing      Iron deficiency anemia, unspecified    Outpatient Encounter Medications as of 11/25/2023  Medication Sig   acetaminophen  (TYLENOL ) 500 MG tablet Take 500 mg by mouth  every 6 (six) hours as needed for fever, headache or moderate pain.   aspirin  EC 81 MG tablet Take 81 mg by mouth daily.   bictegravir-emtricitabine -tenofovir  AF (BIKTARVY ) 50-200-25 MG TABS tablet Take 1 tablet by mouth daily. Try to take at the same time each day with or without food.   Blood Glucose Monitoring Suppl DEVI 1 each by Does not apply route in the morning, at noon, and at bedtime. May substitute to any manufacturer covered by patient's insurance.   cholecalciferol (VITAMIN D3) 25 MCG (1000 UNIT) tablet Take 1,000 Units by mouth daily.   Continuous Glucose Receiver (FREESTYLE LIBRE 3 READER) DEVI 1 each by Does not apply route as directed.   Continuous Glucose Sensor (FREESTYLE LIBRE 3 PLUS SENSOR) MISC Change sensor every 15 days.   dapsone  100 MG tablet Take 1 tablet (100 mg total) by mouth daily.   Dulaglutide  (TRULICITY ) 4.5 MG/0.5ML SOAJ Inject 4.5 mg as directed once a week.   DULoxetine  (CYMBALTA ) 60 MG capsule Take 60 mg by mouth every evening.   escitalopram  (LEXAPRO ) 20 MG tablet Take 1 tablet (20 mg total) by mouth daily.   famotidine  (PEPCID ) 10 MG tablet Take 1 tablet (10 mg total) by mouth 2 (two) times daily as needed for heartburn or indigestion.   Glucose Blood (BLOOD GLUCOSE TEST STRIPS) STRP 1 each by In Vitro route in the morning, at noon, and at bedtime. May substitute to any manufacturer covered by patient's insurance.   Insulin  Glargine Solostar 300 UNIT/ML SOPN Inject 5-10 Units into the skin every evening. (Patient taking differently: Inject 5-10 Units into the skin every evening.)   insulin  lispro (HUMALOG  KWIKPEN) 100 UNIT/ML KwikPen Inject 10 Units into the skin in the morning. (Patient taking differently: Inject 10 Units into the skin in the morning.)   Lancet Device MISC 1 each by Does not apply route in the morning, at noon, and at bedtime. May substitute to any manufacturer covered by patient's insurance.   Lancets Misc. MISC 1 each by Does not apply route  in the morning, at noon, and at bedtime. May substitute to any manufacturer covered by patient's insurance.   midodrine  (PROAMATINE ) 10 MG tablet Take 1 tablet by mouth.   Multiple Vitamin (MULTIVITAMIN) tablet Take 1 tablet by mouth daily.   multivitamin (RENA-VIT) TABS tablet Take 1 tablet by mouth daily.   Omega 3 1000 MG CAPS Take 1,000 mg by mouth.   Oxycodone  HCl 10 MG TABS Take  10 mg by mouth every 4 (four) hours as needed (pain). (Patient taking differently: Take 10 mg by mouth every 4 (four) hours as needed (pain).)   polyethylene glycol (MIRALAX ) 17 g packet Take 17 g by mouth daily as needed for mild constipation or moderate constipation.   rosuvastatin  (CRESTOR ) 40 MG tablet Take 1 tablet (40 mg total) by mouth daily.   [DISCONTINUED] albuterol  (VENTOLIN  HFA) 108 (90 Base) MCG/ACT inhaler Inhale 2 puffs into the lungs every 6 (six) hours as needed for wheezing or shortness of breath.   [DISCONTINUED] allopurinol  (ZYLOPRIM ) 100 MG tablet Take 100 mg by mouth daily.   [DISCONTINUED] atorvastatin  (LIPITOR ) 80 MG tablet Take 1 tablet (80 mg total) by mouth daily.   [DISCONTINUED] calcium  acetate (PHOSLO ) 667 MG capsule Take 2 capsules (1,334 mg total) by mouth 3 (three) times daily with meals.   [DISCONTINUED] clopidogrel  (PLAVIX ) 75 MG tablet Take 1 tablet (75 mg total) by mouth daily.   [DISCONTINUED] Dulaglutide  (TRULICITY ) 0.75 MG/0.5ML SOPN Inject 0.75 mg into the skin every Wednesday.   [DISCONTINUED] finasteride  (PROSCAR ) 5 MG tablet Take 5 mg by mouth every evening.   [DISCONTINUED] tamsulosin  (FLOMAX ) 0.4 MG CAPS capsule Take 1 capsule (0.4 mg total) by mouth daily after supper.   albuterol  (VENTOLIN  HFA) 108 (90 Base) MCG/ACT inhaler Inhale 2 puffs into the lungs every 6 (six) hours as needed for wheezing or shortness of breath.   allopurinol  (ZYLOPRIM ) 100 MG tablet Take 1 tablet (100 mg total) by mouth daily.   calcium  acetate (PHOSLO ) 667 MG capsule Take 2 capsules (1,334 mg  total) by mouth 3 (three) times daily with meals.   clopidogrel  (PLAVIX ) 75 MG tablet Take 1 tablet (75 mg total) by mouth daily.   finasteride  (PROSCAR ) 5 MG tablet Take 1 tablet (5 mg total) by mouth every evening.   Lancets (ONETOUCH ULTRASOFT) lancets Use as instructed   tamsulosin  (FLOMAX ) 0.4 MG CAPS capsule Take 1 capsule (0.4 mg total) by mouth daily after supper.   [DISCONTINUED] atorvastatin  (LIPITOR ) 80 MG tablet Take 1 tablet (80 mg total) by mouth daily.   No facility-administered encounter medications on file as of 11/25/2023.    Follow-up: Return in about 3 months (around 02/25/2024) for HYPERLIPIDEMIA, DM.   Faustino Luecke R Tally Mckinnon, FNP "

## 2023-11-26 DIAGNOSIS — N22 Calculus of urinary tract in diseases classified elsewhere: Secondary | ICD-10-CM | POA: Insufficient documentation

## 2023-11-27 DIAGNOSIS — I2510 Atherosclerotic heart disease of native coronary artery without angina pectoris: Secondary | ICD-10-CM | POA: Diagnosis not present

## 2023-11-27 DIAGNOSIS — D689 Coagulation defect, unspecified: Secondary | ICD-10-CM | POA: Diagnosis not present

## 2023-12-01 ENCOUNTER — Telehealth: Payer: Self-pay

## 2023-12-01 NOTE — Telephone Encounter (Signed)
 Copied from CRM #[id number]. Topic: General - Other >> Dec 01, 2023 10:17 AM Debby BROCKS wrote: Reason for CRM: Patient would like to speak to someone within the clinic regarding his wheelchair assessment

## 2023-12-02 ENCOUNTER — Telehealth: Payer: Self-pay

## 2023-12-02 NOTE — Telephone Encounter (Signed)
 Copied from CRM #[id number]. Topic: Clinical - Medical Advice >> Dec 01, 2023  2:08 PM Gustabo D wrote: Patient wants a call back at [phone] from  the nurse  Pt was advise medication was sent to [facility] [address] [facility]

## 2023-12-04 NOTE — Telephone Encounter (Signed)
Done Kh 

## 2023-12-07 DIAGNOSIS — E1129 Type 2 diabetes mellitus with other diabetic kidney complication: Secondary | ICD-10-CM | POA: Diagnosis not present

## 2023-12-07 DIAGNOSIS — Z992 Dependence on renal dialysis: Secondary | ICD-10-CM | POA: Diagnosis not present

## 2023-12-07 DIAGNOSIS — N186 End stage renal disease: Secondary | ICD-10-CM | POA: Diagnosis not present

## 2023-12-09 DIAGNOSIS — Z89511 Acquired absence of right leg below knee: Secondary | ICD-10-CM | POA: Diagnosis not present

## 2023-12-09 DIAGNOSIS — E1165 Type 2 diabetes mellitus with hyperglycemia: Secondary | ICD-10-CM | POA: Diagnosis not present

## 2023-12-09 DIAGNOSIS — Z89612 Acquired absence of left leg above knee: Secondary | ICD-10-CM | POA: Diagnosis not present

## 2023-12-09 DIAGNOSIS — S78112A Complete traumatic amputation at level between left hip and knee, initial encounter: Secondary | ICD-10-CM | POA: Diagnosis not present

## 2023-12-09 DIAGNOSIS — N184 Chronic kidney disease, stage 4 (severe): Secondary | ICD-10-CM | POA: Diagnosis not present

## 2023-12-09 DIAGNOSIS — J209 Acute bronchitis, unspecified: Secondary | ICD-10-CM | POA: Diagnosis not present

## 2023-12-09 DIAGNOSIS — I10 Essential (primary) hypertension: Secondary | ICD-10-CM | POA: Diagnosis not present

## 2023-12-09 DIAGNOSIS — G894 Chronic pain syndrome: Secondary | ICD-10-CM | POA: Diagnosis not present

## 2023-12-09 DIAGNOSIS — E1129 Type 2 diabetes mellitus with other diabetic kidney complication: Secondary | ICD-10-CM | POA: Diagnosis not present

## 2023-12-09 DIAGNOSIS — Z89611 Acquired absence of right leg above knee: Secondary | ICD-10-CM | POA: Diagnosis not present

## 2023-12-11 ENCOUNTER — Ambulatory Visit: Attending: Orthopedic Surgery

## 2023-12-11 DIAGNOSIS — R293 Abnormal posture: Secondary | ICD-10-CM | POA: Insufficient documentation

## 2023-12-11 DIAGNOSIS — R2689 Other abnormalities of gait and mobility: Secondary | ICD-10-CM | POA: Insufficient documentation

## 2023-12-11 DIAGNOSIS — M6281 Muscle weakness (generalized): Secondary | ICD-10-CM | POA: Insufficient documentation

## 2023-12-11 DIAGNOSIS — R2681 Unsteadiness on feet: Secondary | ICD-10-CM | POA: Insufficient documentation

## 2023-12-11 NOTE — Therapy (Signed)
 OUTPATIENT PHYSICAL THERAPY WHEELCHAIR EVALUATION   Patient Name: Keith [name] MRN: [id number] DOB:1965-06-30, 58 y.o., male Today's Date: 12/11/2023  END OF SESSION:  PT End of Session - 12/11/23 1442     Visit Number 1    Number of Visits 1    Authorization Type [employer] medicare    PT Start Time 1445    PT Stop Time 1510    PT Time Calculation (min) 25 min    Activity Tolerance Patient tolerated treatment well    Behavior During Therapy WFL for tasks assessed/performed          Past Medical History:  Diagnosis Date   Abscess of right foot    abscess/ulcer of R transtibial amputation requiring IV abx   Acute ST elevation myocardial infarction (STEMI) due to occlusion of circumflex coronary artery (HCC) 03/22/2020   AIDS (HCC)    Anemia    CAD (coronary artery disease)    a. MI with stenting of OM1 in 11/2018 with residual disease. b. acute STEMI 03/2020 s/p DES to OM1   Chronic anemia    Chronic knee pain    right   Chronic pain    CKD (chronic kidney disease), stage IV (HCC)    CVA (cerebral vascular accident) (HCC) 04/2020   Diabetes type 2, uncontrolled    HgA1c 17.6 (04/27/2010)   Diabetic foot ulcer (HCC) 01/2017   right foot   Dilatation of aorta (HCC)    Erectile dysfunction    Genital warts    GERD (gastroesophageal reflux disease)    History of blood transfusion    HIV (human immunodeficiency virus infection) (HCC) 2009   CD4 count 100, VL 13800 (05/01/2010)   Hyperlipidemia    Hypertension    Myocardial infarction [facility])    Neuropathy    Noncompliance with medication regimen    Osteomyelitis (HCC)    h/o hand   Osteomyelitis of metatarsal (HCC) 04/28/2017   Pneumonia    STEMI (ST elevation myocardial infarction) (HCC) 11/10/2018   Past Surgical History:  Procedure Laterality Date   A/V FISTULAGRAM N/A 05/07/2023   Procedure: A/V Fistulagram;  Surgeon: Melia Lynwood ORN, MD;  Location: [facility] INVASIVE CV LAB;  Service: Cardiovascular;  Laterality: N/A;    AMPUTATION Right 05/02/2017   Procedure: AMPUTATION TRANSMETARSAL;  Surgeon: Harden Jerona GAILS, MD;  Location: [facility] OR;  Service: Orthopedics;  Laterality: Right;   AMPUTATION Right 06/13/2017   Procedure: RIGHT BELOW KNEE AMPUTATION;  Surgeon: Harden Jerona GAILS, MD;  Location: [facility] OR;  Service: Orthopedics;  Laterality: Right;   AMPUTATION Left 10/27/2020   Procedure: LEFT BELOW KNEE AMPUTATION;  Surgeon: Harden Jerona GAILS, MD;  Location: [facility] OR;  Service: Orthopedics;  Laterality: Left;   AMPUTATION Left 11/17/2020   Procedure: REVISION AMPUTATION BELOW KNEE, LEFT;  Surgeon: Harden Jerona GAILS, MD;  Location: [facility] OR;  Service: Orthopedics;  Laterality: Left;   AMPUTATION Left 01/03/2021   Procedure: LEFT ABOVE KNEE AMPUTATION;  Surgeon: Harden Jerona GAILS, MD;  Location: [facility] OR;  Service: Orthopedics;  Laterality: Left;   APPLICATION OF WOUND VAC Left 10/27/2020   Procedure: APPLICATION OF WOUND VAC;  Surgeon: Harden Jerona GAILS, MD;  Location: [facility] OR;  Service: Orthopedics;  Laterality: Left;   AV FISTULA PLACEMENT Left 03/14/2022   Procedure: LEFT BRACHIOCEPHALIC ARTERIOVENOUS (AV) FISTULA CREATION;  Surgeon: Lanis Fonda BRAVO, MD;  Location: [facility] OR;  Service: Vascular;  Laterality: Left;  PERIPHERAL NERVE BLOCK   BELOW KNEE LEG AMPUTATION Right 06/13/2017  BELOW KNEE LEG AMPUTATION     CORONARY STENT INTERVENTION N/A 11/10/2018   Procedure: CORONARY STENT INTERVENTION;  Surgeon: Anner Alm ORN, MD;  Location: [facility] INVASIVE CV LAB;  Service: Cardiovascular;  Laterality: N/A;   CORONARY/GRAFT ACUTE MI REVASCULARIZATION N/A 03/21/2020   Procedure: Coronary/Graft Acute MI Revascularization;  Surgeon: Court Dorn PARAS, MD;  Location: [facility] INVAS[facility] CV LAB;  Service: Cardiovascular;  Laterality: N/A;   CYSTOSCOPY N/A 11/09/2020   Procedure: CYSTOSCOPY, EVACUATION OF CLOTS, FULGERATION OF BLADDER AND BILATERIAL RETROGRADE PYELOGRAMS;  Surgeon: Selma Donnice SAUNDERS, MD;  Location: [facility] OR;  Service: Urology;  Laterality: N/A;   HERNIA REPAIR      I & D EXTREMITY Left 08/21/2014   Procedure: INCISION AND DRAINAGE LEFT SMALL FINGER;  Surgeon: Franky Curia, MD;  Location: [facility] OR;  Service: Orthopedics;  Laterality: Left;   I & D EXTREMITY Right 03/18/2017   Procedure: IRRIGATION AND DEBRIDEMENT EXTREMITY;  Surgeon: Harden Jerona GAILS, MD;  Location: [facility] OR;  Service: Orthopedics;  Laterality: Right;   IR FLUORO GUIDE CV LINE RIGHT  11/02/2020   IR FLUORO GUIDE CV LINE RIGHT  03/12/2022   IR FLUORO GUIDE CV LINE RIGHT  09/03/2022   IR FLUORO RM 30-60 MIN  09/04/2022   IR REMOVAL TUN CV CATH W/O FL  11/13/2020   IR US  GUIDE VASC ACCESS RIGHT  11/02/2020   IR US  GUIDE VASC ACCESS RIGHT  03/12/2022   IR US  GUIDE VASC ACCESS RIGHT  09/03/2022   IR VENOCAVAGRAM SVC  03/12/2022   LEFT HEART CATH AND CORONARY ANGIOGRAPHY N/A 11/10/2018   Procedure: LEFT HEART CATH AND CORONARY ANGIOGRAPHY;  Surgeon: Anner Alm ORN, MD;  Location: [facility] INVASIVE CV LAB;  Service: Cardiovascular;  Laterality: N/A;   LEFT HEART CATH AND CORONARY ANGIOGRAPHY N/A 03/21/2020   Procedure: LEFT HEART CATH AND CORONARY ANGIOGRAPHY;  Surgeon: Court Dorn PARAS, MD;  Location: [facility];  Service: Cardiovascular;  Laterality: N/A;   MINOR IRRIGATION AND DEBRIDEMENT OF WOUND Right 04/22/2014   Procedure: IRRIGATION AND DEBRIDEMENT OF RIGHT NECK ABCESS;  Surgeon: Alm Bouche, MD;  Location: [facility] OR;  Service: ENT;  Laterality: Right;   MULTIPLE EXTRACTIONS WITH ALVEOLOPLASTY N/A 01/18/2013   Procedure: MULTIPLE EXTRACION 3, 6, 7, 10, 11, 13, 21, 22, 27, 28, 29, 30 WITH ALVEOLOPLASTY;  Surgeon: Glendia CHRISTELLA Primrose, DDS;  Location: [facility] OR;  Service: Oral Surgery;  Laterality: N/A;   SKIN SPLIT GRAFT Right 03/21/2017   Procedure: IRRIGATION AND DEBRIDEMENT RIGHT FOOT AND APPLY SPLIT THICKNESS SKIN GRAFT AND WOUND VAC;  Surgeon: Harden Jerona GAILS, MD;  Location: [facility] OR;  Service: Orthopedics;  Laterality: Right;   STUMP REVISION Left 12/02/2020   Procedure: REVISION LEFT BELOW KNEE AMPUTATION;   Surgeon: Harden Jerona GAILS, MD;  Location: [facility] OR;  Service: Orthopedics;  Laterality: Left;   TEE WITHOUT CARDIOVERSION N/A 05/02/2017   Procedure: TRANSESOPHAGEAL ECHOCARDIOGRAM (TEE);  Surgeon: Alveta Aleene PARAS, MD;  Location: [facility] OR;  Service: Cardiovascular;  Laterality: N/A;  coincidental to orthopedic case   Patient Active Problem List   Diagnosis Date Noted   Calculus of urinary tract in diseases classified elsewhere 11/26/2023   Impaired mobility and activities of daily living 11/25/2023   Asthma 11/25/2023   Iron deficiency anemia, unspecified 04/22/2023   Disorder of phosphorus metabolism, unspecified 03/07/2023   Dialysis AV fistula malfunction (HCC) 09/04/2022   Dialysis AV fistula malfunction, initial encounter (HCC) 09/02/2022   Hyperkalemia, diminished renal excretion 09/02/2022   ESRD on  dialysis (HCC) 09/02/2022   AKI (acute kidney injury) (HCC) 03/09/2022   Gallbladder anomaly 03/09/2022   Fall at home, initial encounter 12/29/2021   Acute cystitis 12/29/2021   History of anemia due to chronic kidney disease 12/29/2021   Nonketotic hyperglycinemia, type II (HCC) 12/18/2021   Asymptomatic bacteriuria 12/18/2021   Type 2 diabetes mellitus with hyperosmolar nonketotic hyperglycemia (HCC) 12/06/2021   Hyperglycemia 12/06/2021   BPH (benign prostatic hyperplasia)    GERD (gastroesophageal reflux disease)    Headache 08/22/2021   Anxiety 02/13/2021   Healthcare maintenance 02/13/2021   History of left above knee amputation (HCC) 02/07/2021   Wound dehiscence 01/03/2021   Coagulopathy (HCC) 12/22/2020   CAD (coronary artery disease) 12/22/2020   Obesity 12/22/2020   Ulcer of heel due to diabetes mellitus (HCC)    Anemia    Hyperglycemia due to diabetes mellitus (HCC) 08/29/2020   Type 2 diabetes mellitus with hyperlipidemia (HCC)    PAD (peripheral artery disease) (HCC)    Uncontrolled type 2 diabetes mellitus with hyperglycemia, with long-term current use of insulin  with  renal complication 01/14/2019   Elevated glucose 01/14/2019   Hx of BKA, right (HCC) 01/14/2019   Pressure injury of skin 11/11/2018   CKD (chronic kidney disease) stage 4, GFR 15-29 ml/min (HCC) 11/10/2018   Amputation stump infection (HCC) 10/28/2018   Type II diabetes mellitus with renal manifestations (HCC) 08/07/2018   Uncontrolled type 2 diabetes mellitus with hyperosmolar nonketotic hyperglycemia (HCC) 08/07/2018   Non-pressure chronic ulcer of right calf limited to breakdown of skin (HCC) 07/06/2018   Type 2 diabetes mellitus without complication, without long-term current use of insulin  (HCC) 06/15/2018   Chronic low back pain without sciatica 06/15/2018   Idiopathic chronic venous hypertension of left lower extremity with ulcer and inflammation (HCC)    Venous stasis ulcer of left calf limited to breakdown of skin without varicose veins (HCC) 04/23/2018   Acquired absence of right leg below knee (HCC) 06/13/2017   Diabetic polyneuropathy associated with type 2 diabetes mellitus (HCC) 01/23/2017   Diarrhea 09/28/2016   Nausea vomiting and diarrhea 09/28/2016   Onychomycosis of multiple toenails with type 2 diabetes mellitus (HCC) 08/29/2015   MRSA carrier 04/20/2014   Penile wart 02/22/2014   HIV disease (HCC)    Insulin -requiring or dependent type II diabetes mellitus (HCC) 02/04/2014   Dental anomaly 11/20/2012   Arthritis of right knee 02/23/2012   Hyperlipidemia 11/10/2011   Hyponatremia 11/10/2011   Chronic pain 08/07/2011   Meralgia paraesthetica 04/23/2011   ERECTILE DYSFUNCTION 08/22/2008   HTN (hypertension) 05/19/2008    PCP: Folashade Paseda, FNP  REFERRING PROVIDER: Ozell Bruch, MD  THERAPY DIAG:  Unsteadiness on feet - Plan: PT plan of care cert/re-cert  Abnormal posture - Plan: PT plan of care cert/re-cert  Muscle weakness (generalized) - Plan: PT plan of care cert/re-cert  Other abnormalities of gait and mobility - Plan: PT plan of care  cert/re-cert  Rationale for Evaluation and Treatment Rehabilitation  SUBJECTIVE:  SUBJECTIVE STATEMENT: Pt presents for wheelchair evaluation. He is currently in a standard wheelchair, which he has had for ~5 years. He does not have a right footrest. He has a L AKA (2022) and R BKA (2020).   PRECAUTIONS: Fall, L UE fistula    WEIGHT BEARING RESTRICTIONS No  PLOF:  Requires assistive device for independence, Needs assistance with ADLs, Needs assistance with homemaking, and Needs assistance with transfers  PATIENT GOALS: to get a power wheelchair     MEDICAL HISTORY:  Primary diagnosis onset: 2020     Medical Diagnosis with ICD-10 code: Z89.511, hx of BKA, right    [] Progressive disease  Relevant future surgeries:     Height: 5'10 Weight: 259lbs Explain recent changes or trends in weight:      History:  Past Medical History:  Diagnosis Date   Abscess of right foot    abscess/ulcer of R transtibial amputation requiring IV abx   Acute ST elevation myocardial infarction (STEMI) due to occlusion of circumflex coronary artery (HCC) 03/22/2020   AIDS (HCC)    Anemia    CAD (coronary artery disease)    a. MI with stenting of OM1 in 11/2018 with residual disease. b. acute STEMI 03/2020 s/p DES to OM1   Chronic anemia    Chronic knee pain    right   Chronic pain    CKD (chronic kidney disease), stage IV (HCC)    CVA (cerebral vascular accident) (HCC) 04/2020   Diabetes type 2, uncontrolled    HgA1c 17.6 (04/27/2010)   Diabetic foot ulcer (HCC) 01/2017   right foot   Dilatation of aorta (HCC)    Erectile dysfunction    Genital warts    GERD (gastroesophageal reflux disease)    History of blood transfusion    HIV (human immunodeficiency virus infection) (HCC) 2009   CD4 count 100, VL 13800  (05/01/2010)   Hyperlipidemia    Hypertension    Myocardial infarction (HCC)    Neuropathy    Noncompliance with medication regimen    Osteomyelitis (HCC)    h/o hand   Osteomyelitis of metatarsal (HCC) 04/28/2017   Pneumonia    STEMI (ST elevation myocardial infarction) (HCC) 11/10/2018       Cardio Status:  Functional Limitations:   [] Intact  [x]  Impaired    h/o MI  Respiratory Status:  Functional Limitations:   [x] Intact  [] Impaired   [] SOB [] COPD [] O2 Dependent ______LPM  [] Ventilator Dependent  Resp equip:                                                     Objective Measure(s):   Orthotics:   [x] Amputee:                                                             [x] Prosthesis: R BKA, L AKA    HOME ENVIRONMENT:  [] House [] Condo/town home [x] Apartment [] Asst living [] LTCF         [] Own  [x] Rent   [x] Lives alone [] Lives with others -  Hours without assistance: 24  [x] Home is accessible to patient                                 Storage of wheelchair:  [x] In home   [] Other Comments:       COMMUNITY :  TRANSPORTATION:  [] Car [] Engineer, petroleum [] Adapted w/c Lift []  Ambulance [] Other:                     [x] Sits in wheelchair during transport   Where is w/c stored during transport?  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 [] no   Employment and/or school:  Specific requirements pertaining to mobility        Other:  COMMUNICATION:  Verbal Communication  [x] WFL [] receptive [x] WFL [] expressive [] Understandable  [] Difficult to understand  [] non-communicative  Primary Language:___english___________ 2nd:_____________  Communication provided by:[x] Patient [] Family [] Caregiver [] Translator   [] Uses an augmentative communication device     Manufacturer/Model :    MOBILITY/BALANCE:  Sitting Balance  Standing Balance  Transfers  Ambulation   [x] WFL      [] WFL  [x] Independent  []  Independent    [] Uses UE for balance in sitting Comments:  [x] Uses UE/device for stability Comments: RW []  Min assist  []  Ambulates independently with       device:___________________      []  Mod assist  []  Able to ambulate ______ feet        safely/functionally/independently   []  Min assist  []  Min assist  []  Max assist  []  Non-functional ambulator         History/High risk of falls   []  Mod assist  []  Mod assist  []  Dependent  [x]  Unable to ambulate   []  Max  assist  []  Max assist  Transfer method:[] 1 person [] 2 person [] sliding board [] squat pivot [] stand pivot [] mechanical patient lift  [x] other: A/P transfers  []  Unable  []  Unable    Fall History: # of falls in the past 6 months? 0 # of "near" falls in the past 6 months? About 1x/ day     CURRENT SEATING / MOBILITY:  Current Mobility Device: [x] None [] Cane/Walker [] Manual [] Dependent [] Dependent w/ Tilt rScooter  [] Power (type of control):   Manufacturer:  Model:  Serial #:   Size:  Color:  Age:   Purchased by whom:   Current condition of mobility base:    Current seating system:                                                                       Age of seating system:    Describe posture in present seating system:    Is the current mobility meeting medical necessity?:  [] Yes [] No Describe:    Ability to complete Mobility-Related Activities of Daily Living (MRADL's) with Current Mobility Device:   Move room to room  [x] Independent  [] Min [] Mod [] Max assist  [] Unable  Comments:   Meal prep  [] Independent  [] Min [] Mod [] Max assist  [x] Unable    Feeding  [x] Independent  [] Min [] Mod [] Max assist  [] Unable    Bathing  [  x]Independent  [] Min [] Mod [] Max assist  [] Unable    Grooming  [x] Independent  [] Min [] Mod [] Max assist  [] Unable    UE dressing  [x] Independent  [] Min [] Mod [] Max assist  [] Unable    LE dressing  [x] Independent   [] Min [] Mod [] Max assist  [] Unable    Toileting  [x] Independent  [] Min [] Mod [] Max assist  [] Unable    Bowel Mgt: [x]   Continent []  Incontinent []  Accidents []  Diapers []  Colostomy []  Bowel Program:  Bladder Mgt: [x]  Continent []  Incontinent []  Accidents []  Diapers []  Urinal []  Intermittent Cath []  Indwelling Cath []  Supra-pubic Cath     Current Mobility Equipment Trialed/ Ruled Out:    Does not meet mobility needs due to:    Mark all boxes that indicate inability to use the specific equipment listed     Meets needs for safe  independent functional  ambulation  / mobility    Risk of  Falling or History of Falls    Enviromental limitations      Cognition    Safety concerns with  physical ability    Decreased / limitations endurance  & strength     Decreased / limitations  motor skills  & coordination    Pain    Pace /  Speed    Cardiac and/or  respiratory condition    Contra - indicated by diagnosis   Cane/Crutches  []   [x]   []   []   [x]   [x]   [x]   [x]   [x]   [x]   []    Walker / Rollator  []  NA   []   [x]   []   []   [x]   [x]   [x]   [x]   [x]   [x]   []     Manual Wheelchair [id number]:  []  NA  []   []   []   []   [x]   [x]   [x]   [x]   [x]   [x]   []    Manual W/C (K0005) with power assist  []  NA  []   []   []   []   []   []   []   []   []   []   []    Scooter  []  NA  []   []   []   []   []   []   []   []   []   []   []    Power Wheelchair: standard joystick  []  NA  [x]   []   []   []   []   []   []   []   []   []   []    Power Wheelchair: alternative controls  []  NA  []   []   []   []   []   []   []   []   []   []   []    Summary:  The least costly alternative for independent functional mobility was found to be:    []  Crutch/Cane  []  Walker []  Manual w/c  []  Manual w/c with power assist   []  Scooter   [x]  Power w/c std joystick   []  Power w/c alternative control        []  Requires dependent care mobility device   Cabin crew for [employer] skills are adequate for safe mobility equipment operation  [x]   Yes []   No  Patient is willing and motivated to use recommended mobility equipment  [x]   Yes []   No       []   Patient is unable to safely operate mobility equipment independently and requires dependent care equipment Comments:           SENSATION and SKIN ISSUES:  Sensation [x]  Intact  []  Impaired []  Absent []   Hyposensate []  Hypersensate  []  Defensiveness  Location(s) of impairment:    Pressure Relief Method(s):  [x]  Lean side to side to offload (without risk of falling)  []   W/C push up (4+ times/hour for 15+ seconds) []  Stand up (without risk of falling)    []  Other: (Describe): Effective pressure relief method(s) above can be performed consistently throughout the day: [x] Yes  []  No If not, Why?:  Skin Integrity Risk:       []  Low risk           [x]  Moderate risk            []  High risk  If high risk, explain:   Skin Issues/Skin Integrity  Current skin Issues  []  Yes [x]  No [x]  Intact  []   Red area   []   Open area  []  Scar tissue  [x]  At risk from prolonged sitting  Where: sacrum, coccyx, ischial tuberosities  History of Skin Issues  []  Yes [x]  No Where : When: Stage: Hx of skin flap surgeries  []  Yes [x]  No Where:  When:  Pain: [x]  Yes []  No   Pain Location(s): B LE- phantom limb pain Intensity scale: (0-10) : 9/10 How does pain interfere with mobility and/or MRADLs? - limited tolerance for mobility, mentally exhausting    MAT EVALUATION:  Neuro-Muscular Status: (Tone, Reflexive, Responses, etc.)     [x]   Intact   []  Spasticity:  []  Hypotonicity  []  Fluctuating  []  Muscle Spasms  []  Poor Righting Reactions/Poor Equilibrium Reactions  []  Primal Reflex(s):    Comments:            COMMENTS:    POSTURE:     Comments:  Pelvis Anterior/Posterior:  []  Neutral   [x]  Posterior  []  Anterior  []  Fixed - No movement []  Tendency away from neutral [x]  Flexible [x]  Self-correction [x]  External correction Obliquity (viewed from front)  [x]  WFL []  R Obliquity []  L Obliquity  []  Fixed - No movement []  Tendency away from neutral []  Flexible []  Self-correction []   External correction Rotation  [x]  WFL []  R anterior []  L anterior  []  Fixed - No movement []  Tendency away from neutral []  Flexible []  Self-correction []  External correction Tonal Influence Pelvis:  [x]  Normal []  Flaccid []  Low tone []  Spasticity []  Dystonia []  Pelvis thrust []  Other:    Trunk Anterior/Posterior:  []  WFL [x]  Thoracic kyphosis []  Lumbar lordosis  []  Fixed - No movement [x]  Tendency away from neutral [x]  Flexible [x]  Self-correction [x]  External correction  [x]  WFL []  Convex to left  []  Convex to right []  S-curve   []  C-curve []  Multiple curves []  Tendency away from neutral []  Flexible []  Self-correction []  External correction Rotation of shoulders and upper trunk:  [x]  Neutral []  Left-anterior []  Right- anterior []  Fixed- no movement []  Tendency away from neutral []  Flexible []  Self correction []  External correction Tonal influence Trunk:  [x]  Normal []  Flaccid []  Low tone []  Spasticity []  Dystonia []  Other:   Head & Neck  [x]  Functional []  Flexed    []  Extended []  Rotated right  []  Rotated left []  Laterally flexed right []  Laterally flexed left []  Cervical hyperextension   [x]  Good head control []  Adequate head control []  Limited head control []  Absent head control Describe tone/movement of head and neck:      Lower Extremity Measurements: LE ROM:  Active ROM Right 12/11/2023 Left 12/11/2023  Hip flexion    Hip extension  Hip abduction    Hip adduction    Knee flexion    Knee extension -18*   Ankle dorsiflexion    Ankle plantarflexion     (Blank rows = not tested)  LE MMT:  MMT Right 12/11/2023 Left 12/11/2023  Hip flexion 3+ 3  Hip extension    Hip abduction 3 3  Hip adduction 3 3  Knee flexion 3   Knee extension 3   Ankle dorsiflexion    Ankle plantarflexion     (Blank rows = not tested)  Hip positions:  [x]  Neutral   []  Abducted   []  Adducted  []  Subluxed   []  Dislocated   []  Fixed   []  Tendency away  from neutral []  Flexible []  Self-correction []  External correction   Hip Windswept:[x]  Neutral  []  Right    []  Left  []  Subluxed   []  Dislocated   []  Fixed   []  Tendency away from neutral []  Flexible []  Self-correction []  External correction  LE Tone: [x]  Normal []  Low tone []  Spasticity []  Flaccid []  Dystonia []  Rocks/Extends at hip []  Thrust into knee extension []  Pushes legs downward into footrest  UE Measurements:   UPPER EXTREMITY MMT:  MMT Right 12/11/2023 Left 12/11/2023  Shoulder flexion 3 3  Shoulder abduction 3 3  Shoulder adduction    Elbow flexion 4 4  Elbow extension 4 4  Wrist flexion    Wrist extension    Pinch strength    Grip strength normal decr  (Blank rows = not tested)  Shoulder Posture:  Right Tendency towards Left  [x]   Functional [x]    []   Elevation []    []   Depression []    []   Protraction []    []   Retraction []    []   Internal rotation []    []   External rotation []    []   Subluxed []      UE Edema: [x]  1+ (Barely detectable impression when finger is pressed into skin) []  2+ (slight indentation. 15 seconds to rebound) []  3+ (deeper indentation. 30 seconds to rebound) []  4+ (>30 seconds to rebound)  Wrist/Hand: Handedness: [x]  Right   []  Left   []  NA: Comments:  Right  Left  []   WNL []    []   Limitations []    []   Contractures []    []   Fisting []    []   Tremors []    []   Weak grasp [x]    [x]   Poor dexterity [x]    []   Hand movement non functional []    []   Paralysis []         MOBILITY BASE RECOMMENDATIONS and JUSTIFICATION:  MOBILITY BASE  JUSTIFICATION   Manufacturer:    Model:                              Color:  Seat Width:  20 Seat Depth    []  Manual mobility base (continue below)   []  Scooter/POV  [x]  Power mobility base   Number of hours per day spent in above selected mobility base: 12  Typical daily mobility base use Schedule: transfers into wc to complete any and all MRADLs   [x]  is not a safe, functional  ambulator  [x]  limitation prevents from completing a MRADL(s) within a reasonable time frame    [x]  limitation places at high risk of morbidity or mortality secondary to  the attempts to perform a    MRADL(s)  [x]  limitation prevents accomplishing a MRADL(s)  entirely  [x]  provide independent mobility  [x]  equipment is a lifetime medical need  [x]  walker or cane inadequate  [x]  any type manual wheelchair      inadequate  [x]  scooter/POV inadequate      []  requires dependent mobility         []  Ultra-lightweight manual wheelchair  K0005     Arm:    []  both []  right  []  left     Foot:   []  both []  right  []  left       []  hemi height required  []  heavy duty    Front seat to floor _____ inches      Rear seat to floor _____ inches      Back height _____ inches     Back angle ______ degrees      Front angle _____ degrees  []   full-time manual wheelchair user  []  Requires individualized fitting and optimal adjustments for multiple features that include adjustable axle configuration, fully adjustable center of gravity, wheel camber, seat and back angle, angle of seat slope, which cannot be accommodated by a K0001 through K0004 manual wheelchair  []  prevent repetitive use injuries  []  daily use_________hours   []  user has high activity patterns that frequently require  them  to go out into the community for the purpose of independently accomplishing high level MRADL activities. Examples of these might include a combination of; shopping, work, school, Photographer, childcare, independently loading and unloading from a vehicle etc.  []  lower seat height required to foot propel  []  short stature  []  heavy duty -  weight over 250lbs   []  Current chair is a K0005   manufacture:___________________  model:_________________  serial#____________________  age:_________    []  First time X9994 user (complete trial)  K0004 time and # of strokes to propel 30 feet: ________seconds _________strokes   X9994 time and # of strokes to propel 30 feet: ________seconds _________strokes  What was the result of the trial between the K0004 and K0005 manual wheelchair? ___    What features of the K0005 w/c are needed as compared to the K0004 base? Why?___    []  adjustable seat and back angle changes the angle of seat slope of the frame to attain a gravity assisted position for efficient propulsion and proper weight distribution along the frame     []  the front of the wheelchair will be configured higher than the back of the chair to allow gravity to assist the user with postural stability  []  the center of the wheel will be positioned for stability, safety and efficient propulsion  []  adjustable axle allows for vertical, horizontal, camber and overall width changes  throughout the wheels for adjustment of the client's exact needs and abilities.   []  adjustable axle increases the stability and function of the chair allowing for adjustment of the center of gravity.   []  accommodates the client's anatomical position in the chair maximizing independence in mobility and maneuverability in all environments.   []  create a minimal fixed tilt-in space to assist in positioning.   []  Describe users full-time manual wheelchair activity patterns:___    []  Power assist Comments:  []  prevent repetitive use injuries  []  repetitive strain injury present in    shoulder girdle    []  shoulder pain is (> or =) to 7/10     during manual propulsion       Current Pain _____/10  []  requires conservation of energy to participate in  MRADL(s) runable to propel up ramps or curbs using manual wheelchair  []  been K0005 user greater than one year  []  user unwilling to use power      wheelchair (reason): []  less expensive option to power   wheelchair   []  rim activated power assist -      decreased strength   []  Heavy duty manual wheelchair       K0006     Arm:    []  both []  right  []  left     Foot:   []  both []  right  []   left     []  hemi height required    []  Dependent base  []  user exceeds 250lbs  []  non-functional ambulator    []  extreme spasticity  []  over active movement   []  broken frame/hx of repeated     repairs  []  able to self-propel in residence       []  lower seat to floor height required  []  unable to self-propel in residence   []  Extra heavy duty manual wheelchair  K0007     Arm:    []  both []  right  []  left     Foot:   []  both []  right  []  left     []  hemi height required  []  Dependent base  []  user exceeds 300lbs  []  non-functional ambulator    []  able to self-propel in residence   []  lower seat to floor height required  []  unable to self-propel in residence     []  Manual wheelchair with tilt [phone]      (Manual "Tilt-n-Space")  []  patient is dependent for transfers  []  patient requires frequent       positioning for pressure relief   []  patient requires frequent      positioning for poor/absent trunk control        []  Stroller Base  []  infant/child   []  unable to propel manual      wheelchair  []  allows for growth  []  non-functional ambulator  []  non-functional UE  []  independent mobility is not a goal at this time    MANUAL FRAME OPTIONS      Push handles  []  extended   []  angle adjustable   []  standard  []  caregiver access  []  caregiver assist    []  allows "hooking" to enable      increased ability to perform ADLs or maintain balance   []  Angle Adjustable Back  []  postural control  []  control of tone/spasticity  []  accommodation of range of motion  []  UE functional control  []  accommodation for seating system    Rear wheel placement  []  std/fixed  [] fully adjustableramputee   []  camber ________degree  []  removable rear wheel  []  non-removable rear wheel  Wheel size _______  Wheel style_______________________  []  improved UE access to wheels  []  increase propulsion ability  []  improved stability  []  changing angle in space for      improvement of postural stability   []  remove for transport    []  allow for seating system to fit on  base  []  amputee placement  []  1-arm drive access   r R  r L  []  enable propulsion of manual       wheelchair with one arm    []  amputee placement   Wheel rims/ Hand rims  []  Standard    []  Specialized-____ []  provide ability to propel manual   []  increase self-propulsion  with hand wheelchair weakness/decreased grasp     []  Spoke protector/guard   []  prevent hands from getting caught in spokes   Tires:  []  pneumatic  []  flat free inserts  []  solid  Style:  []  decrease roll resistance              []  prevent frequent flats  []  increase shock absorbency  []  decrease maintenance   []  decrease pain from road shock    []  decrease spasms from road shock    Wheel Locks:    []  push []  pull []  scissor  []  lock wheels for transfers  []  lock wheels from rolling   Brake/wheel lock extension:  []  R  []  L  []  allow user to operate wheel locks due to decreased reach or strength   Caster housing:  Caster size:                      Style:                                          []  suspension fork  []  maneuverability   []  stability of wheelchair   []  durability  []  maintenance  []  angle adjustment for posture  []  allow for feet to come under        wheelchair base  []  allows change in seat to floor  height   []  increase shock absorbency  []  decrease pain from road shock  []  decrease spasms from road    shock   []  Side guards  []  prevent clothing getting caught in wheel or becoming soiled   [] provide hip and pelvic stability  []  eliminates contact between body and wheels  []  limit hand contact with wheels   []  Anti-tippers      []  prevent wheelchair from tipping    backward  []  assist caregiver with curbs     POWER MOBILITY      []  Scooter/POV    []  can safely operate   []  can safely transfer   []  has adequate trunk stability   []  cannot functionally propel  manual wheelchair    [x]  Power mobility base    [x]   non-ambulatory   [x]  cannot functionally propel manual wheelchair   [x]  cannot functionally and safely      operate scooter/POV  [x]  can safely operate power       wheelchair  [x]  home is accessible  [x]  willing to use power wheelchair     Tilt  []  Powered tilt on powered chair  []  Powered tilt on manual chair  []  Manual tilt on manual chair Comments:  []  change position for pressure      []  elief/cannot weight shift   []  change position against      gravitational force on head and      shoulders   []  decrease pain  []  blood pressure management   []  control autonomic dysreflexia  []  decrease respiratory distress  []  management of spasticity  []  management of low tone  []  facilitate postural control   []  rest periods   []  control edema  []  increase sitting tolerance   []  aid with transfers     Recline   []  Power recline on power chair  []  Manual recline on manual chair  Comments:    []  intermittent catheterization  []  manage spasticity  []   accommodate femur to back angle  []  change position for pressure relief/cannot weight shift rhigh risk of pressure sore development  []  tilt alone does not accomplish     effective pressure relief, maximum pressure relief achieved at -      _______ degrees tilt   _______ degrees recline   []  difficult to transfer to and from bed []  rest periods and sleeping in chair  []  repositioning for transfers  []  bring to full recline for ADL care  []  clothing/diaper changes in chair  []  gravity PEG tube feeding  []  head positioning  []  decrease pain  []  blood pressure management   []  control autonomic dysreflexia  []  decrease respiratory distress  []  user on ventilator     Elevator on mobility base  []  Power wheelchair  []  Scooter  []  increase Indep in transfers   []  increase Indep in ADLs    []  bathroom function and safety  []  kitchen/cooking function and safety  []  shopping  []  raise height for communication at standing level  []  raise  height for eye contact which reduces cervical neck strain and pain  []  drive at raised height for safety and navigating crowds  []  Other:   []  Vertical position system  (anterior tilt)     (Drive locks-out)    []  Stand       (Drive enabled)  []  independent weight bearing  []  decrease joint contractures  []  decrease/manage spasticity  []  decrease/manage spasms  []  pressure distribution away from   scapula, sacrum, coccyx, and ischial tuberosity  []  increase digestion and elimination   []  access to counters and cabinets  []  increase reach  []  increase interaction with others at eye level, reduces neck strain  []  increase performance of       MRADL(s)      Power elevating legrest    []  Center mount (Single) 85-170 degrees       []  Standard (Pair) 100-170 degrees  []  position legs at 90 degrees, not available with std power ELR  []  center mount tucks into chair to decrease turning radius in home, not available with std power ELR  []  provide change in position for LE  []  elevate legs during recline    []  maintain placement of feet on      footplate  []  decrease edema  []  improve circulation  []  actuator needed to elevate legrest  []  actuator needed to articulate legrest preventing knees from flexing  []  Increase ground clearance over      curbs  []   STD (pair) independently                     elevate legrest   POWER WHEELCHAIR CONTROLS      Controls/input device  []  Expandable  [x]  Non-expandable  [x]  Proportional  [x]  Right Hand []  Left Hand  []  Non-proportional/switches/head-array  []  Electrical/proximity         []   Mechanical      Manufacturer:___________________   Type:________________________ [x]  provides access for controlling wheelchair  [x]  programming for accurate control  []  progressive disease/changing condition  []  required for alternative drive      controls       []  lacks motor control to operate  proportional drive control  []  unable to understand  proportional controls  []  limited movement/strength  []  extraneous movement / tremors / ataxic / spastic       []  Upgraded electronics controller/harness    []   Single power (tilt or recline)   []  Expandable    []  Non-expandable plus   []  Multi-power (tilt, recline, power legrest, power seat lift, vertical positioning system, stand)  []  allows input device to communicate with drive motors  []  harness provides necessary connections between the controller, input device, and seat functions     []  needed in order to operate power seat functions through joystick/ input device  []  required for alternative drive controls     []  Enhanced display  []  required to connect all alternative drive controls   []  required for upgraded joystick      (lite-throw, heavy duty, micro)  []  Allows user to see in which mode and drive the wheelchair is set; necessary for alternate controls       []  Upgraded tracking electronics  []  correct tracking when on uneven surfaces makes switch driving more efficient and less fatiguing  []  increase safety when driving  []  increase ability to traverse thresholds    []  Safety / reset / mode switches     Type:    []  Used to change modes and stop the wheelchair when driving     [x]  Mount for joystick / input device/switches  [x]  swing away for access or transfers   [x]  attaches joystick / input device / switches to wheelchair   [x]  provides for consistent access  []  midline for optimal placement    []  Attendant controlled joystick plus     mount  []  safety  []  long distance driving  []  operation of seat functions  []  compliance with transportation regulations    [x]  Battery  [x]  required to power (power assist / scooter/ power wc / other):   []  Power inverter (24V to 12V)  []  required for ventilator / respiratory equipment / other:     CHAIR OPTIONS MANUAL & POWER      Armrests   [x]  adjustable height []  removable  []  swing away []  fixed  [x]  flip back  []  reclining   [x]  full length pads []  desk []  tube arms []  gel pads  [x]  provide support with elbow at 90    [x]  remove/flip back/swing away for  transfers  [x]  provide support and positioning of upper body    []  allow to come closer to table top  []  remove for access to tables  []  provide support for w/c tray  [x]  change of height/angles for variable activities   []  Elbow support / Elbow stop  []  keep elbow positioned on arm pad  []  keep arms from falling off arm pad  during tilt and/or recline   Upper Extremity Support  []  Arm trough  []   R  []   L  Style:  []  swivel mount []  fixed mount   []  posterior hand support  []   tray  []  full tray  []  joystick cut out  []   R  []   L  Style:  []  decrease gravitational pull on      shoulders  []  provide support to increase UE  function  []  provide hand support in natural    position  []  position flaccid UE  []  decrease subluxation    []  decrease edema       []  manage spasticity   []  provide midline positioning  []  provide work surface  []  placement for AAC/ Computer/ EADL       Hangers/ Legrests   []  ______ degree  []  Elevating []  articulating  []  swing away []   fixed []  lift off  []  heavy duty  []  adjustable knee angle  []  adjustable calf panel   []  longer extension tube              []  provide LE support  []  maintain placement of feet on      footplate   []  accommodate lower leg length  []  accommodate to hamstring       tightness  []  enable transfers  []  provide change in position for LE's  []  elevate legs during recline    []  decrease edema  []  durability      Foot support   []  footplate []  R []  L []  flip up           []  Depth adjustable   []  angle adjustable  []  foot board/one piece    []  provide foot support  []  accommodate to ankle ROM  []  allow foot to go under wheelchair base  []  enable transfers     []  Shoe holders  []  position foot    []  decrease / manage spasticity  []  control position of LE  []  stability    []  safety      []  Ankle strap/heel      loops  []  support foot on foot support  []  decrease extraneous movement  []  provide input to heel   []  protect foot     []  Amputee adapter []  R  []  L     Style:                  Size:  []  Provide support for stump/residual extremity    []  Transportation tie-down  []  to provide crash tested tie-down brackets    []  Crutch/cane holder    []  O2 holder    []  IV hanger   []  Ventilator tray/mount    []  stabilize accessory on wheelchair       Component  Justification     []  Seat cushion      []  accommodate impaired sensation  []  decubitus ulcers present or history  []  unable to shift weight  []  increase pressure distribution  []  prevent pelvic extension  []  custom required "off-the-shelf"    seat cushion will not accommodate deformity  []  stabilize/promote pelvis alignment  []  stabilize/promote femur alignment  []  accommodate obliquity  []  accommodate multiple deformity  []  incontinent/accidents  []  low maintenance     []  seat mounts                 []  fixed []  removable  []  attach seat platform/cushion to wheelchair frame    []  Seat wedge    []  provide increased aggressiveness of seat shape to decrease sliding  down in the seat  []  accommodate ROM        []  Cover replacement   []  protect back or seat cushion  []  incontinent/accidents    []  Solid seat / insert    []  support cushion to prevent      hammocking  []  allows attachment of cushion to mobility base    []  Lateral pelvic/thigh/hip     support (Guides)     []  decrease abduction  []  accommodate pelvis  []  position upper legs  []  accommodate spasticity  []  removable for transfers     []  Lateral pelvic/thigh      supports mounts  []  fixed   []  swing-away   []  removable  []  mounts lateral pelvic/thigh supports     []   mounts lateral pelvic/thigh supports swing-away or removable for transfers    []  Medial thigh support ([place]mel)  [] decrease adduction  [] accommodate ROM  []  remove for transfers   []   alignment      []  Medial thigh   []  fixed      support mounts      []  swing-away   []  removable  []  mounts medial thigh supports   []  Mounts medial supports swing- away or removable for transfers       Component  Justification   []  Back       []  provide posterior trunk support []  facilitate tone  []  provide lumbar/sacral support []  accommodate deformity  []  support trunk in midline   []  custom required "off-the-shelf" back support will not accommodate deformity   []  provide lateral trunk support []  accommodate or decrease tone            []  Back mounts  []  fixed  []  removable  []  attach back rest/cushion to wheelchair frame   []  Lateral trunk      supports  []  R []  L  []  decrease lateral trunk leaning  []  accommodate asymmetry    []  contour for increased contact  []  safety    []  control of tone    []  Lateral trunk      supports mounts  []  fixed  []  swing-away   []  removable  []  mounts lateral trunk supports     []  Mounts lateral trunk supports swing-away or removable for transfers   []  Anterior chest      strap, vest     []  decrease forward movement of shoulder  []  decrease forward movement of trunk  []  safety/stability  []  added abdominal support  []  trunk alignment  []  assistance with shoulder control   []  decrease shoulder elevation    []  Headrest      []  provide posterior head support  []  provide posterior neck support  []  provide lateral head support  []  provide anterior head support  []  support during tilt and recline  []  improve feeding     []  improve respiration  []  placement of switches  []  safety    []  accommodate ROM   []  accommodate tone  []  improve visual orientation   []  Headrest           []  fixed []  removable []  flip down      Mounting hardware   []  swing-away laterals/switches  []  mount headrest   []  mounts headrest flip down or  removable for transfers  []  mount headrest swing-away laterals   []  mount switches     []  Neck Support    []  decrease  neck rotation  []  decrease forward neck flexion   Pelvic Positioner    []  std hip belt          []  padded hip belt  []  dual pull hip belt  []  four point hip belt  []  stabilize tone  []  decrease falling out of chair  []  prevent excessive extension  []  special pull angle to control      rotation  []  pad for protection over boney   prominence  []  promote comfort    []  Essential needs        bag/pouch   []  medicines []  special food rorthotics []  clothing changes  []  diapers  []  catheter/hygiene []  ostomy supplies   The above equipment has a life- long use expectancy.  Growth and changes in medical  and/or functional conditions would be the exceptions.   SUMMARY:  Why mobility device was selected; include why a lower level device is not appropriate:    ASSESSMENT:  CLINICAL IMPRESSION: Patient is a 58 y.o. male who was seen today for physical therapy evaluation for a group 2 power wheelchair. He is currently using a standard manual wheelchair without a R footrest, despite having a R prosthetic. 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 He is unable to efficiently and effectively propel a manual wheelchair due to this and his extensive cardiac history, including a MI. He lives alone and must be able to complete all of his MRADLs independently, efficiently and safely. He has an 18* R knee flexion contracture making it difficult, if not unsafe, to stand for prolonged periods of time or ambulate to complete his MRADLs.  He will require height adjustable, full length, flip back arm rests to provide him with adequate B UE support, but be removable for safe transfers in and out of his wheelchair. A battery is necessary to be able to operate the power wheelchair. This is a lifelong medical need for this patient as he is non ambulatory. This power wheelchair will allow the patient to complete any and all of his MRADLs in a safe, independent and efficient manner.     OBJECTIVE IMPAIRMENTS cardiopulmonary status limiting activity, decreased activity tolerance, decreased endurance, decreased knowledge of condition, decreased knowledge of use of DME, decreased ROM, decreased strength, impaired flexibility, impaired UE functional use, improper body mechanics, postural dysfunction, prosthetic dependency , and pain.   ACTIVITY LIMITATIONS carrying, lifting, bending, standing, squatting, stairs, transfers, locomotion level, and caring for others  PARTICIPATION LIMITATIONS: meal prep, cleaning, laundry, interpersonal relationship, driving, shopping, community activity, and occupation  PERSONAL FACTORS Behavior pattern, Fitness, Past/current experiences, Social background, Time since onset of injury/illness/exacerbation, and Transportation are also affecting patient's functional outcome.   REHAB POTENTIAL: Good  CLINICAL DECISION MAKING: Stable/uncomplicated  EVALUATION COMPLEXITY: High                                   GOALS: One time visit. No goals established.    PLAN: PT FREQUENCY: one time visit    Delon DELENA Pop, PT Delon DELENA Pop, PT, DPT, CBIS  12/11/2023, 3:44 PM    I concur with the above findings and recommendations of the therapist:  Physician name printed:         Physician's signature:      Date:

## 2023-12-12 ENCOUNTER — Telehealth: Payer: Self-pay | Admitting: Orthopedic Surgery

## 2023-12-12 ENCOUNTER — Other Ambulatory Visit: Payer: Self-pay

## 2023-12-12 DIAGNOSIS — S78112A Complete traumatic amputation at level between left hip and knee, initial encounter: Secondary | ICD-10-CM

## 2023-12-12 DIAGNOSIS — Z89511 Acquired absence of right leg below knee: Secondary | ICD-10-CM

## 2023-12-12 NOTE — Telephone Encounter (Signed)
 PT order in chart pt is a right BKA and a left AKA needed for prosthetic gait training.

## 2023-12-12 NOTE — Telephone Encounter (Signed)
 Patient called and said if he could get a referral for PT. CB#[phone]

## 2023-12-17 ENCOUNTER — Telehealth: Payer: Self-pay | Admitting: *Deleted

## 2023-12-17 ENCOUNTER — Other Ambulatory Visit: Payer: Self-pay | Admitting: Nurse Practitioner

## 2023-12-17 DIAGNOSIS — N22 Calculus of urinary tract in diseases classified elsewhere: Secondary | ICD-10-CM

## 2023-12-17 DIAGNOSIS — R531 Weakness: Secondary | ICD-10-CM | POA: Diagnosis not present

## 2023-12-17 DIAGNOSIS — I739 Peripheral vascular disease, unspecified: Secondary | ICD-10-CM

## 2023-12-17 DIAGNOSIS — I2510 Atherosclerotic heart disease of native coronary artery without angina pectoris: Secondary | ICD-10-CM | POA: Diagnosis not present

## 2023-12-17 DIAGNOSIS — N401 Enlarged prostate with lower urinary tract symptoms: Secondary | ICD-10-CM

## 2023-12-17 DIAGNOSIS — D689 Coagulation defect, unspecified: Secondary | ICD-10-CM | POA: Diagnosis not present

## 2023-12-17 DIAGNOSIS — K219 Gastro-esophageal reflux disease without esophagitis: Secondary | ICD-10-CM

## 2023-12-17 DIAGNOSIS — E785 Hyperlipidemia, unspecified: Secondary | ICD-10-CM

## 2023-12-17 DIAGNOSIS — E1165 Type 2 diabetes mellitus with hyperglycemia: Secondary | ICD-10-CM

## 2023-12-17 MED ORDER — VITAMIN D 25 MCG (1000 UNIT) PO TABS: 1000.0000 [IU] | ORAL_TABLET | Freq: Every day | ORAL | 1 refills | Status: AC

## 2023-12-17 MED ORDER — BLOOD GLUCOSE TEST VI STRP: 1.0000 | ORAL_STRIP | Freq: Three times a day (TID) | 3 refills | Status: AC

## 2023-12-17 MED ORDER — BLOOD GLUCOSE MONITORING SUPPL DEVI: 1.0000 | Freq: Three times a day (TID) | 0 refills | Status: AC

## 2023-12-17 MED ORDER — FAMOTIDINE 10 MG PO TABS: 10.0000 mg | ORAL_TABLET | Freq: Two times a day (BID) | ORAL | 1 refills | Status: AC | PRN

## 2023-12-17 MED ORDER — FINASTERIDE 5 MG PO TABS: 5.0000 mg | ORAL_TABLET | Freq: Every evening | ORAL | 1 refills | Status: AC

## 2023-12-17 MED ORDER — FREESTYLE LIBRE 3 READER DEVI: 1.0000 | 0 refills | Status: AC

## 2023-12-17 MED ORDER — FREESTYLE LIBRE 3 PLUS SENSOR MISC: 2 refills | Status: AC

## 2023-12-17 MED ORDER — CLOPIDOGREL BISULFATE 75 MG PO TABS: 75.0000 mg | ORAL_TABLET | Freq: Every day | ORAL | 3 refills | Status: AC

## 2023-12-17 MED ORDER — LANCET DEVICE MISC: 1.0000 | Freq: Three times a day (TID) | 0 refills | Status: AC

## 2023-12-17 MED ORDER — TRULICITY 4.5 MG/0.5ML ~~LOC~~ SOAJ: 4.5000 mg | SUBCUTANEOUS | 3 refills | Status: AC

## 2023-12-17 NOTE — Telephone Encounter (Signed)
 Copied from CRM [id number]. Topic: Clinical - Prescription Issue >> Dec 17, 2023 12:07 PM Wess RAMAN wrote: Reason for CRM: Patient stated he did not receive his medications from the pharmacy and the pharmacy stated they didn't have anything for him.  Patient callback #: [phone]  Medications: albuterol  (VENTOLIN  HFA) 108 (90 Base) MCG/ACT inhaler  allopurinol  (ZYLOPRIM ) 100 MG tablet  Blood Glucose Monitoring Suppl DEVI  calcium  acetate (PHOSLO ) 667 MG capsule  cholecalciferol (VITAMIN D3) 25 MCG (1000 UNIT) tablet  clopidogrel  (PLAVIX ) 75 MG tablet  Continuous Glucose Receiver (FREESTYLE LIBRE 3 READER) DEVI  Continuous Glucose Sensor (FREESTYLE LIBRE 3 PLUS SENSOR) MISC  Dulaglutide  (TRULICITY ) 4.5 MG/0.5ML SOAJ  famotidine  (PEPCID ) 10 MG tablet  finasteride  (PROSCAR ) 5 MG tablet  Glucose Blood (BLOOD GLUCOSE TEST STRIPS) STRP  Lancet Device MISC  Lancets Misc. MISC [[id number]]  midodrine  (PROAMATINE ) 10 MG tablet  multivitamin (RENA-VIT) TABS tablet  Omega 3 1000 MG CAPS  Oxycodone  HCl 10 MG TABS  rosuvastatin  (CRESTOR ) 40 MG tablet  tamsulosin  (FLOMAX ) 0.4 MG CAPS capsule   Pharmacy: [id number] - [place], [place] [address] AT [place] [address] [place] [address] Phone: [phone] Fax: [phone] Hours: Not open 24 hours

## 2023-12-17 NOTE — Telephone Encounter (Signed)
 Please advise [facility]

## 2023-12-17 NOTE — Progress Notes (Unsigned)
 Care Guide Pharmacy Note  12/17/2023 Name: Keith [name] MRN: [id number] DOB: 12/10/1965  Referred By: Paseda, Folashade R, FNP Reason for referral: Complex Care Management (Initial outreach to schedule referral with PharmD )   Keith [name] is a 58 y.o. year old male who is a primary care patient of Paseda, Folashade R, FNP.  Keith [name] was referred to the pharmacist for assistance related to: DMII  An unsuccessful telephone outreach was attempted today to contact the patient who was referred to the pharmacy team for assistance with medication management. Additional attempts will be made to contact the patient.  Harlene Satterfield  [facility]  [facility], [facility] Guide  Direct Dial : [phone]  Fax [phone]

## 2023-12-18 NOTE — Progress Notes (Signed)
 Care Guide Pharmacy Note  12/18/2023 Name: Keith [name] MRN: [id number] DOB: 13-Dec-1965  Referred By: Paseda, Folashade R, FNP Reason for referral: Complex Care Management (Initial outreach to schedule referral with PharmD )   Keith [name] is a 58 y.o. year old male who is a primary care patient of Paseda, Folashade R, FNP.  Keith [name] was referred to the pharmacist for assistance related to: DMII  Successful contact was made with the patient to discuss pharmacy services including being ready for the pharmacist to call at least 5 minutes before the scheduled appointment time and to have medication bottles and any blood pressure readings ready for review. The patient agreed to meet with the pharmacist via in office  on (date/time). 02/03/24 at 130 PM  Harlene Satterfield  [facility], [facility] Guide  Direct Dial : [phone]  Fax [phone]

## 2023-12-19 ENCOUNTER — Other Ambulatory Visit: Payer: Self-pay | Admitting: Nurse Practitioner

## 2023-12-19 MED ORDER — OMEGA 3 1000 MG PO CAPS: 1000.0000 mg | ORAL_CAPSULE | Freq: Every day | ORAL | 2 refills | Status: AC

## 2023-12-19 MED ORDER — CALCIUM ACETATE (PHOS BINDER) 667 MG PO CAPS: 1334.0000 mg | ORAL_CAPSULE | Freq: Three times a day (TID) | ORAL | Status: AC

## 2023-12-19 MED ORDER — MIDODRINE HCL 10 MG PO TABS: 10.0000 mg | ORAL_TABLET | Freq: Three times a day (TID) | ORAL | 2 refills | Status: AC

## 2023-12-19 MED ORDER — TAMSULOSIN HCL 0.4 MG PO CAPS: 0.4000 mg | ORAL_CAPSULE | Freq: Every day | ORAL | 1 refills | Status: AC

## 2023-12-19 MED ORDER — RENA-VITE PO TABS: 1.0000 | ORAL_TABLET | Freq: Every day | ORAL | 1 refills | Status: AC

## 2023-12-19 MED ORDER — ROSUVASTATIN CALCIUM 40 MG PO TABS: 40.0000 mg | ORAL_TABLET | Freq: Every day | ORAL | 3 refills | Status: AC

## 2023-12-19 MED ORDER — ALLOPURINOL 100 MG PO TABS: 100.0000 mg | ORAL_TABLET | Freq: Every day | ORAL | 0 refills | Status: AC

## 2023-12-19 MED ORDER — ALBUTEROL SULFATE HFA 108 (90 BASE) MCG/ACT IN AERS: 2.0000 | INHALATION_SPRAY | Freq: Four times a day (QID) | RESPIRATORY_TRACT | 0 refills | Status: AC | PRN

## 2023-12-19 MED ORDER — LANCETS MISC. MISC: 1.0000 | Freq: Three times a day (TID) | 3 refills | Status: AC

## 2023-12-28 DIAGNOSIS — D689 Coagulation defect, unspecified: Secondary | ICD-10-CM | POA: Diagnosis not present

## 2023-12-28 DIAGNOSIS — I2510 Atherosclerotic heart disease of native coronary artery without angina pectoris: Secondary | ICD-10-CM | POA: Diagnosis not present

## 2023-12-31 DIAGNOSIS — Z89612 Acquired absence of left leg above knee: Secondary | ICD-10-CM | POA: Diagnosis not present

## 2023-12-31 DIAGNOSIS — Z89511 Acquired absence of right leg below knee: Secondary | ICD-10-CM | POA: Diagnosis not present

## 2024-01-06 ENCOUNTER — Ambulatory Visit: Admitting: Physical Therapy

## 2024-01-06 DIAGNOSIS — E1129 Type 2 diabetes mellitus with other diabetic kidney complication: Secondary | ICD-10-CM | POA: Diagnosis not present

## 2024-01-06 DIAGNOSIS — N186 End stage renal disease: Secondary | ICD-10-CM | POA: Diagnosis not present

## 2024-01-06 DIAGNOSIS — Z992 Dependence on renal dialysis: Secondary | ICD-10-CM | POA: Diagnosis not present

## 2024-01-06 NOTE — Therapy (Incomplete)
 OUTPATIENT PHYSICAL THERAPY PROSTHETICS EVALUATION   Patient Name: Keith [name] MRN: [id number] DOB:[birth year], 58 y.o., male Today's Date: 01/06/2024  PCP: Paseda, Folashade R, FNP REFERRING PROVIDER: [phone] (ICD-10-CM) - Hx of right BKA (HCC) [phone] (ICD-10-CM) - Unilateral AKA, left (HCC)  END OF SESSION:   Past Medical History:  Diagnosis Date   Abscess of right foot    abscess/ulcer of R transtibial amputation requiring IV abx   Acute ST elevation myocardial infarction (STEMI) due to occlusion of circumflex coronary artery (HCC) 03/22/2020   AIDS (HCC)    Anemia    CAD (coronary artery disease)    a. MI with stenting of OM1 in 11/2018 with residual disease. b. acute STEMI 03/2020 s/p DES to OM1   Chronic anemia    Chronic knee pain    right   Chronic pain    CKD (chronic kidney disease), stage IV (HCC)    CVA (cerebral vascular accident) (HCC) 04/2020   Diabetes type 2, uncontrolled    HgA1c 17.6 (04/27/2010)   Diabetic foot ulcer (HCC) 01/2017   right foot   Dilatation of aorta    Erectile dysfunction    Genital warts    GERD (gastroesophageal reflux disease)    History of blood transfusion    HIV (human immunodeficiency virus infection) (HCC) 2009   CD4 count 100, VL 13800 (05/01/2010)   Hyperlipidemia    Hypertension    Myocardial infarction [facility])    Neuropathy    Noncompliance with medication regimen    Osteomyelitis (HCC)    h/o hand   Osteomyelitis of metatarsal (HCC) 04/28/2017   Pneumonia    STEMI (ST elevation myocardial infarction) (HCC) 11/10/2018   Past Surgical History:  Procedure Laterality Date   A/V FISTULAGRAM N/A 05/07/2023   Procedure: A/V Fistulagram;  Surgeon: Melia Lynwood ORN, MD;  [facility]: [facility] INVASIVE CV LAB;  Service: Cardiovascular;  Laterality: N/A;   AMPUTATION Right 05/02/2017   Procedure: AMPUTATION TRANSMETARSAL;  Surgeon: Harden Jerona GAILS, MD;  Location: [facility] OR;  Service: Orthopedics;  Laterality: Right;   AMPUTATION Right  06/13/2017   Procedure: RIGHT BELOW KNEE AMPUTATION;  Surgeon: Harden Jerona GAILS, MD;  Location: [facility] OR;  Service: Orthopedics;  Laterality: Right;   AMPUTATION Left 10/27/2020   Procedure: LEFT BELOW KNEE AMPUTATION;  Surgeon: Harden Jerona GAILS, MD;  Location: [facility] OR;  Service: Orthopedics;  Laterality: Left;   AMPUTATION Left 11/17/2020   Procedure: REVISION AMPUTATION BELOW KNEE, LEFT;  Surgeon: Harden Jerona GAILS, MD;  Location: [facility] OR;  Service: Orthopedics;  Laterality: Left;   AMPUTATION Left 01/03/2021   Procedure: LEFT ABOVE KNEE AMPUTATION;  Surgeon: Harden Jerona GAILS, MD;  Location: [facility] OR;  Service: Orthopedics;  Laterality: Left;   APPLICATION OF WOUND VAC Left 10/27/2020   Procedure: APPLICATION OF WOUND VAC;  Surgeon: Harden Jerona GAILS, MD;  Location: [facility] OR;  Service: Orthopedics;  Laterality: Left;   AV FISTULA PLACEMENT Left 03/14/2022   Procedure: LEFT BRACHIOCEPHALIC ARTERIOVENOUS (AV) FISTULA CREATION;  Surgeon: Lanis Fonda BRAVO, MD;  Location: [facility] OR;  Service: Vascular;  Laterality: Left;  PERIPHERAL NERVE BLOCK   BELOW KNEE LEG AMPUTATION Right 06/13/2017   BELOW KNEE LEG AMPUTATION     CORONARY STENT INTERVENTION N/A 11/10/2018   Procedure: CORONARY STENT INTERVENTION;  Surgeon: Anner Alm ORN, MD;  Location: [facility] INVASIVE CV LAB;  Service: Cardiovascular;  Laterality: N/A;   CORONARY/GRAFT ACUTE MI REVASCULARIZATION N/A 03/21/2020   Procedure: Coronary/Graft Acute MI Revascularization;  Surgeon: Court Carrier  J, MD;  [facility]: [facility];  Service: Cardiovascular;  Laterality: N/A;   CYSTOSCOPY N/A 11/09/2020   Procedure: CYSTOSCOPY, EVACUATION OF CLOTS, FULGERATION OF BLADDER AND BILATERIAL RETROGRADE PYELOGRAMS;  Surgeon: Selma Donnice SAUNDERS, MD;  Location: [facility] OR;  Service: Urology;  Laterality: N/A;   HERNIA REPAIR     I & D EXTREMITY Left 08/21/2014   Procedure: INCISION AND DRAINAGE LEFT SMALL FINGER;  Surgeon: Franky Curia, MD;  Location: [facility] OR;  Service: Orthopedics;  Laterality: Left;   I  & D EXTREMITY Right 03/18/2017   Procedure: IRRIGATION AND DEBRIDEMENT EXTREMITY;  Surgeon: Harden Jerona GAILS, MD;  Location: [facility] OR;  Service: Orthopedics;  Laterality: Right;   IR FLUORO GUIDE CV LINE RIGHT  11/02/2020   IR FLUORO GUIDE CV LINE RIGHT  03/12/2022   IR FLUORO GUIDE CV LINE RIGHT  09/03/2022   IR FLUORO RM 30-60 MIN  09/04/2022   IR REMOVAL TUN CV CATH W/O FL  11/13/2020   IR US  GUIDE VASC ACCESS RIGHT  11/02/2020   IR US  GUIDE VASC ACCESS RIGHT  03/12/2022   IR US  GUIDE VASC ACCESS RIGHT  09/03/2022   IR VENOCAVAGRAM SVC  03/12/2022   LEFT HEART CATH AND CORONARY ANGIOGRAPHY N/A 11/10/2018   Procedure: LEFT HEART CATH AND CORONARY ANGIOGRAPHY;  Surgeon: Anner Alm ORN, MD;  Location: [facility] INVASIVE CV LAB;  Service: Cardiovascular;  Laterality: N/A;   LEFT HEART CATH AND CORONARY ANGIOGRAPHY N/A 03/21/2020   Procedure: LEFT HEART CATH AND CORONARY ANGIOGRAPHY;  Surgeon: Court Dorn PARAS, MD;  [facility]: [facility];  Service: Cardiovascular;  Laterality: N/A;   MINOR IRRIGATION AND DEBRIDEMENT OF WOUND Right 04/22/2014   Procedure: IRRIGATION AND DEBRIDEMENT OF RIGHT NECK ABCESS;  Surgeon: Alm Bouche, MD;  Location: [facility] OR;  Service: ENT;  Laterality: Right;   MULTIPLE EXTRACTIONS WITH ALVEOLOPLASTY N/A 01/18/2013   Procedure: MULTIPLE EXTRACION 3, 6, 7, 10, 11, 13, 21, 22, 27, 28, 29, 30 WITH ALVEOLOPLASTY;  Surgeon: Glendia CHRISTELLA Primrose, DDS;  Location: [facility] OR;  Service: Oral Surgery;  Laterality: N/A;   SKIN SPLIT GRAFT Right 03/21/2017   Procedure: IRRIGATION AND DEBRIDEMENT RIGHT FOOT AND APPLY SPLIT THICKNESS SKIN GRAFT AND WOUND VAC;  Surgeon: Harden Jerona GAILS, MD;  Location: [facility] OR;  Service: Orthopedics;  Laterality: Right;   STUMP REVISION Left 12/02/2020   Procedure: REVISION LEFT BELOW KNEE AMPUTATION;  Surgeon: Harden Jerona GAILS, MD;  Location: [facility] OR;  Service: Orthopedics;  Laterality: Left;   TEE WITHOUT CARDIOVERSION N/A 05/02/2017   Procedure: TRANSESOPHAGEAL ECHOCARDIOGRAM (TEE);   Surgeon: Alveta Aleene PARAS, MD;  Location: [facility] OR;  Service: Cardiovascular;  Laterality: N/A;  coincidental to orthopedic case   Patient Active Problem List   Diagnosis Date Noted   Calculus of urinary tract in diseases classified elsewhere 11/26/2023   Impaired mobility and activities of daily living 11/25/2023   Asthma 11/25/2023   Iron deficiency anemia, unspecified 04/22/2023   Disorder of phosphorus metabolism, unspecified 03/07/2023   Dialysis AV fistula malfunction 09/04/2022   Dialysis AV fistula malfunction, initial encounter 09/02/2022   Hyperkalemia, diminished renal excretion 09/02/2022   ESRD on dialysis (HCC) 09/02/2022   AKI (acute kidney injury) 03/09/2022   Gallbladder anomaly 03/09/2022   Fall at home, initial encounter 12/29/2021   Acute cystitis 12/29/2021   History of anemia due to chronic kidney disease 12/29/2021   Nonketotic hyperglycinemia, type II 12/18/2021   Asymptomatic bacteriuria 12/18/2021   Type 2 diabetes mellitus with  hyperosmolar nonketotic hyperglycemia (HCC) 12/06/2021   Hyperglycemia 12/06/2021   BPH (benign prostatic hyperplasia)    GERD (gastroesophageal reflux disease)    Headache 08/22/2021   Anxiety 02/13/2021   Healthcare maintenance 02/13/2021   History of left above knee amputation (HCC) 02/07/2021   Wound dehiscence 01/03/2021   Coagulopathy 12/22/2020   CAD (coronary artery disease) 12/22/2020   Obesity 12/22/2020   Ulcer of heel due to diabetes mellitus (HCC)    Anemia    Hyperglycemia due to diabetes mellitus (HCC) 08/29/2020   Type 2 diabetes mellitus with hyperlipidemia (HCC)    PAD (peripheral artery disease)    Uncontrolled type 2 diabetes mellitus with hyperglycemia, with long-term current use of insulin  with renal complication 01/14/2019   Elevated glucose 01/14/2019   Hx of BKA, right (HCC) 01/14/2019   Pressure injury of skin 11/11/2018   CKD (chronic kidney disease) stage 4, GFR 15-29 ml/min (HCC) 11/10/2018    Amputation stump infection (HCC) 10/28/2018   Type II diabetes mellitus with renal manifestations (HCC) 08/07/2018   Uncontrolled type 2 diabetes mellitus with hyperosmolar nonketotic hyperglycemia (HCC) 08/07/2018   Non-pressure chronic ulcer of right calf limited to breakdown of skin (HCC) 07/06/2018   Type 2 diabetes mellitus without complication, without long-term current use of insulin  (HCC) 06/15/2018   Chronic low back pain without sciatica 06/15/2018   Idiopathic chronic venous hypertension of left lower extremity with ulcer and inflammation (HCC)    Venous stasis ulcer of left calf limited to breakdown of skin without varicose veins (HCC) 04/23/2018   Acquired absence of right leg below knee (HCC) 06/13/2017   Diabetic polyneuropathy associated with type 2 diabetes mellitus (HCC) 01/23/2017   Diarrhea 09/28/2016   Nausea vomiting and diarrhea 09/28/2016   Onychomycosis of multiple toenails with type 2 diabetes mellitus (HCC) 08/29/2015   MRSA carrier 04/20/2014   Penile wart 02/22/2014   HIV disease (HCC)    Insulin -requiring or dependent type II diabetes mellitus (HCC) 02/04/2014   Dental anomaly 11/20/2012   Arthritis of right knee 02/23/2012   Hyperlipidemia 11/10/2011   Hyponatremia 11/10/2011   Chronic pain 08/07/2011   Meralgia paraesthetica 04/23/2011   ERECTILE DYSFUNCTION 08/22/2008   HTN (hypertension) 05/19/2008    ONSET DATE: 12/12/2023 (referral)   REFERRING DIAG: Z89.511 (ICD-10-CM) - Hx of right BKA (HCC) D21.887J (ICD-10-CM) - Unilateral AKA, left (HCC)  THERAPY DIAG:  No diagnosis found.  Rationale for Evaluation and Treatment: Rehabilitation  SUBJECTIVE:   SUBJECTIVE STATEMENT: *** Pt accompanied by: {accompnied:27141}  PERTINENT HISTORY: coronary atherosclerotic heart disease, ST elevation MI, HIV, chronic pain syndrome on Percocet, CVA, diabetes, hyperlipidemia, hypertension, ESRD. R BKA (2020), L AKA (2022)  PAIN:  Are you having pain?  {OPRCPAIN:27236}  PRECAUTIONS: {Therapy precautions:24002}  RED FLAGS: {PT Red Flags:29287}   WEIGHT BEARING RESTRICTIONS: {Yes ***/No:24003}  FALLS: Has patient fallen in last 6 months? {fallsyesno:27318}  LIVING ENVIRONMENT: Lives with: {OPRC lives with:25569::lives with their family} Lives in: {Lives in:25570} Home Access: {HOMEACCESS:26869} Home layout: {Home Layout:26870} Stairs: {opstairs:27293} Has following equipment at home: {Assistive devices:23999}  PLOF: {PLOF:24004}  PATIENT GOALS: ***  OBJECTIVE:  Note: Objective measures were completed at Evaluation unless otherwise noted.  DIAGNOSTIC FINDINGS: ***  COGNITION: Overall cognitive status: {cognition:24006}   SENSATION: {sensation:27233}  MUSCLE LENGTH: Hamstrings: Right *** deg; Left *** deg Debby test: Right *** deg; Left *** deg  DTRs:  {DTR SITE:24025}  POSTURE: {posture:25561}  LOWER EXTREMITY ROM:  {AROM/PROM:27142} ROM Right eval Left eval  Hip flexion  Hip extension    Hip abduction    Hip adduction    Hip internal rotation    Hip external rotation    Knee flexion    Knee extension    Ankle dorsiflexion    Ankle plantarflexion    Ankle inversion    Ankle eversion     (Blank rows = not tested)  LOWER EXTREMITY MMT:  MMT Right eval Left eval  Hip flexion    Hip extension    Hip abduction    Hip adduction    Hip internal rotation    Hip external rotation    Knee flexion    Knee extension    Ankle dorsiflexion    Ankle plantarflexion    Ankle inversion    Ankle eversion    (Blank rows = not tested)  BED MOBILITY:  {Bed mobility:24027}  TRANSFERS: {transfers:26872}  RAMP: {Levels of assistance:24026}  CURB: {Levels of assistance:24026}  GAIT: Gait pattern: {gait characteristics:25376} Distance walked: *** Assistive device utilized: {Assistive devices:23999} Level of assistance: {Levels of assistance:24026} Gait velocity: *** {uom:26873} Comments:  ***  FUNCTIONAL TESTS:  {Functional tests:24029}  CARDIOVASCULAR RESPONSE: Functional activity: *** Pre-activity vitals: BP: *** HR: *** SpO2: *** Post-activity vitals: BP: *** HR: *** SpO2: *** Modified Borg scale for dyspnea: {Modified Borg scale:26876}  CURRENT PROSTHETIC WEAR ASSESSMENT: Patient is independent with: {prosthetic rjmz:73125} Patient is dependent with: {prosthetic care:26874} Donning prosthesis: {Levels of assistance:24026} Doffing prosthesis: {Levels of assistance:24026} Prosthetic wear tolerance: *** hours/day, *** days/week Prosthetic weight bearing tolerance: *** minutes Edema: *** Residual limb condition: *** Prosthetic description: *** K code/activity level with prosthetic use: {K-levels:26875}  PATIENT SURVEYS:  {rehab surveys:24030}                                                                                                                              TREATMENT DATE: ***   TREATMENT:  PATIENT EDUCATED ON FOLLOWING PROSTHETIC CARE: Prosthetic wear tolerance: *** hours/day, *** days/week Prosthetic weight bearing tolerance: *** minutes Other education  {PTprostheticeducation:27015}  PATIENT EDUCATION: Education details: *** Person educated: {Person educated:25204} Education method: {Education Method:25205} Education comprehension: {Education Comprehension:25206}  HOME EXERCISE PROGRAM: ***  ASSESSMENT:  CLINICAL IMPRESSION: Patient is a 58 year old male referred to Neuro OPPT for R BKA and L AKA. Pt's PMH is significant for: coronary atherosclerotic heart disease, ST elevation MI, HIV, chronic pain syndrome on Percocet, CVA, diabetes, hyperlipidemia, hypertension, ESRD. The following deficits were present during the exam: ***. Based on ***, pt is an incr risk for falls. Pt would benefit from skilled PT to address these impairments and functional limitations to maximize functional mobility independence   OBJECTIVE IMPAIRMENTS:  {opptimpairments:25111}.   ACTIVITY LIMITATIONS: {activitylimitations:27494}  PARTICIPATION LIMITATIONS: {participationrestrictions:25113}  PERSONAL FACTORS: {Personal factors:25162} are also affecting patient's functional outcome.   REHAB POTENTIAL: {rehabpotential:25112}  CLINICAL DECISION MAKING: {clinical decision making:25114}  EVALUATION COMPLEXITY: {Evaluation complexity:25115}   GOALS: Goals reviewed with patient? {yes/no:20286}  SHORT TERM GOALS:  Target date: ***  *** Baseline: Goal status: INITIAL  2.  *** Baseline:  Goal status: INITIAL  3.  *** Baseline:  Goal status: INITIAL  4.  *** Baseline:  Goal status: INITIAL  5.  *** Baseline:  Goal status: INITIAL  6.  *** Baseline:  Goal status: INITIAL  LONG TERM GOALS: Target date: ***  *** Baseline:  Goal status: INITIAL  2.  *** Baseline:  Goal status: INITIAL  3.  *** Baseline:  Goal status: INITIAL  4.  *** Baseline:  Goal status: INITIAL  5.  *** Baseline:  Goal status: INITIAL  6.  *** Baseline:  Goal status: INITIAL   PLAN:  PT FREQUENCY: {rehab frequency:25116}  PT DURATION: {rehab duration:25117}  PLANNED INTERVENTIONS: {rehab planned interventions:25118::97110-Therapeutic exercises,97530- Therapeutic [phone]- Neuromuscular re-education,97535- Self Rjmz,02859- Manual therapy,Patient/Family education}  PLAN FOR NEXT SESSION: ***   Jalexis Breed E Fatmata Legere, PT, DPT 01/06/2024, 8:37 AM

## 2024-01-07 ENCOUNTER — Ambulatory Visit: Payer: Medicare (Managed Care) | Admitting: Internal Medicine

## 2024-01-12 ENCOUNTER — Encounter (HOSPITAL_COMMUNITY): Admission: RE | Payer: Self-pay | Source: Home / Self Care

## 2024-01-12 ENCOUNTER — Ambulatory Visit (HOSPITAL_COMMUNITY): Admission: RE | Admit: 2024-01-12 | Source: Home / Self Care | Admitting: Vascular Surgery

## 2024-01-12 DIAGNOSIS — E119 Type 2 diabetes mellitus without complications: Secondary | ICD-10-CM | POA: Diagnosis not present

## 2024-01-12 DIAGNOSIS — I10 Essential (primary) hypertension: Secondary | ICD-10-CM | POA: Diagnosis not present

## 2024-01-12 DIAGNOSIS — G894 Chronic pain syndrome: Secondary | ICD-10-CM | POA: Diagnosis not present

## 2024-01-12 SURGERY — A/V FISTULAGRAM
Anesthesia: LOCAL | Site: Arm Upper | Laterality: Left

## 2024-01-13 ENCOUNTER — Encounter (HOSPITAL_COMMUNITY): Payer: Self-pay

## 2024-01-15 ENCOUNTER — Ambulatory Visit: Payer: Medicare (Managed Care) | Admitting: Internal Medicine

## 2024-01-16 ENCOUNTER — Ambulatory Visit (HOSPITAL_COMMUNITY): Admission: RE | Admit: 2024-01-16 | Source: Home / Self Care | Admitting: Vascular Surgery

## 2024-01-16 ENCOUNTER — Encounter (HOSPITAL_COMMUNITY): Admission: RE | Payer: Self-pay | Source: Home / Self Care

## 2024-01-16 SURGERY — A/V FISTULAGRAM
Anesthesia: LOCAL | Site: Arm Upper | Laterality: Left

## 2024-01-27 DIAGNOSIS — D689 Coagulation defect, unspecified: Secondary | ICD-10-CM | POA: Diagnosis not present

## 2024-01-27 DIAGNOSIS — I2510 Atherosclerotic heart disease of native coronary artery without angina pectoris: Secondary | ICD-10-CM | POA: Diagnosis not present

## 2024-01-27 DIAGNOSIS — R531 Weakness: Secondary | ICD-10-CM | POA: Diagnosis not present

## 2024-01-29 ENCOUNTER — Encounter: Payer: Self-pay | Admitting: Physical Therapy

## 2024-01-29 ENCOUNTER — Ambulatory Visit: Attending: Orthopedic Surgery | Admitting: Physical Therapy

## 2024-01-29 VITALS — BP 138/85 | HR 78

## 2024-01-29 DIAGNOSIS — R2689 Other abnormalities of gait and mobility: Secondary | ICD-10-CM | POA: Insufficient documentation

## 2024-01-29 DIAGNOSIS — S78112A Complete traumatic amputation at level between left hip and knee, initial encounter: Secondary | ICD-10-CM | POA: Insufficient documentation

## 2024-01-29 DIAGNOSIS — M6281 Muscle weakness (generalized): Secondary | ICD-10-CM | POA: Insufficient documentation

## 2024-01-29 DIAGNOSIS — R2681 Unsteadiness on feet: Secondary | ICD-10-CM | POA: Insufficient documentation

## 2024-01-29 DIAGNOSIS — R293 Abnormal posture: Secondary | ICD-10-CM | POA: Diagnosis present

## 2024-01-29 DIAGNOSIS — Z89511 Acquired absence of right leg below knee: Secondary | ICD-10-CM | POA: Diagnosis not present
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
## 2024-01-30 DIAGNOSIS — Z89511 Acquired absence of right leg below knee: Secondary | ICD-10-CM | POA: Diagnosis not present

## 2024-01-30 DIAGNOSIS — E1165 Type 2 diabetes mellitus with hyperglycemia: Secondary | ICD-10-CM | POA: Diagnosis not present

## 2024-01-30 DIAGNOSIS — Z89612 Acquired absence of left leg above knee: Secondary | ICD-10-CM | POA: Diagnosis not present

## 2024-02-03 ENCOUNTER — Telehealth: Payer: Self-pay

## 2024-02-03 ENCOUNTER — Telehealth: Payer: Self-pay | Admitting: Nurse Practitioner

## 2024-02-03 ENCOUNTER — Ambulatory Visit: Payer: Self-pay

## 2024-02-03 ENCOUNTER — Other Ambulatory Visit: Payer: Self-pay | Admitting: Nurse Practitioner

## 2024-02-03 ENCOUNTER — Ambulatory Visit: Admitting: Physical Therapy

## 2024-02-03 DIAGNOSIS — E1122 Type 2 diabetes mellitus with diabetic chronic kidney disease: Secondary | ICD-10-CM

## 2024-02-03 MED ORDER — LANTUS SOLOSTAR 100 UNIT/ML ~~LOC~~ SOPN: 20.0000 [IU] | PEN_INJECTOR | Freq: Every day | SUBCUTANEOUS | 3 refills | Status: AC

## 2024-02-03 MED ORDER — INSULIN LISPRO (1 UNIT DIAL) 100 UNIT/ML (KWIKPEN): 10.0000 [IU] | PEN_INJECTOR | Freq: Every day | SUBCUTANEOUS | 3 refills | Status: DC

## 2024-02-03 MED ORDER — INSULIN LISPRO (1 UNIT DIAL) 100 UNIT/ML (KWIKPEN): 10.0000 [IU] | PEN_INJECTOR | Freq: Two times a day (BID) | SUBCUTANEOUS | 3 refills | Status: AC

## 2024-02-03 NOTE — Progress Notes (Signed)
 02/03/2024 Name: Keith [name] MRN: [id number] DOB: January 15, 1966  No chief complaint on file.   Keith [name] is a 58 y.o. year old male who was referred for medication management by their primary care provider, Paseda, Folashade R, FNP. They presented for a face to face visit today.   They were referred to the pharmacist by their PCP for assistance in managing diabetes . PMH includes HTN, PAD s/p RBKA and LAKA, CAD s/p STEMI and CVA, GERD, T2DM, ESRD on dialysis (M/W/F) since 2023, HLD, HIV, BMI > 30.    Subjective: Patient was last seen by PCP, Keith Shall, NP, on 11/25/23. At last visit, patient reported that he had been out of Trulicity  for ~ 2 weeks - Trulicity  4.5 mg weekly was refilled. Reported taking rapid acting insulin  20 units in the morning and 20 units of long actin insulin  at night. Reported hypoglycemia post-dialysis. Ordered FL3+ CGM. He was not monitoring his blood sugars. He was in need of much assistance for ADLs.  Today, patient presents in *** good spirits and presents without *** any assistance. ***Patient is accompanied by ***.   CGM set up?     Care Team: Primary Care Provider: Paseda, Folashade R, FNP ; Next Scheduled Visit: 03/02/24   Medication Access/Adherence  Current Pharmacy:  [id number] [place], [place] - [address] AT [facility] [address] [place] [address] Phone: [phone] Fax: [phone]  [place] [facility] [address] [place] [address] Phone: [phone] Fax: [phone]  [id number] - [place], [place] - [address] AT [facility] [address] [place] [address] Phone: [phone] Fax: [phone]  [facility] - [place], [place] - [address] [place] [address] Phone: [phone] Fax: [phone]   Patient reports affordability concerns with their  medications: No  - [employer] Dual Complete Patient reports access/transportation concerns to their pharmacy: {YES/NO:21197} Patient reports adherence concerns with their medications:  {YES/NO:21197} ***  *** Patient denies adherence with medications, reports missing *** medications *** times per week, on average.   Diabetes:  Current medications: Humalog  10 units once daily in the morning, Toujeo  u300 5-10 units into the skin every evening, Trulicity  4.5 mg weekly Medications tried in the past: ***  Current glucose readings: *** Fl3+ CGM and reader were ordered in August 2025 - ***  Date of Download: *** % Time CGM is active: ***% Average Glucose: *** mg/dL Glucose Management Indicator: ***  Glucose Variability: *** (goal <36%) Time in Goal:  - Time in range 70-180: ***% - Time above range: ***% - Time below range: ***% Observed patterns:  Patient {Actions; denies-reports:120008} hypoglycemic s/sx including ***dizziness, shakiness, sweating. Patient {Actions; denies-reports:120008} hyperglycemic symptoms including ***polyuria, polydipsia, polyphagia, nocturia, neuropathy, blurred vision.  Current meal patterns:  - Breakfast: *** - Lunch *** - Supper *** - Snacks *** - Drinks ***  Current physical activity: ***  Current medication access support: ***  Macrovascular and Microvascular Risk Reduction:  Statin? yes (rosuvastatin  40 mg daily); - atorvastatin  preferred for dialysis ACEi/ARB? none Last urinary albumin /creatinine ratio:  Lab Results  Component Value Date   MICRALBCREAT 8,237 (H) 08/22/2021   MICRALBCREAT 26.0 02/22/2014   MICRALBCREAT 75.9 (H) 04/10/2010   Last eye exam:   Last foot exam: 04/05/2010 Tobacco Use:  Tobacco Use: Low Risk  (01/29/2024)   Patient History  Smoking Tobacco Use: Never    Smokeless Tobacco Use: Never    Passive Exposure: Never    Objective:  BP Readings from Last 3 Encounters:  01/29/24 138/85  11/25/23 119/65  09/04/23 (!)  160/73    Lab Results  Component Value Date   HGBA1C 7.5 (A) 11/25/2023   HGBA1C 7.5 11/25/2023   HGBA1C 5.6 09/02/2022       Latest Ref Rng & Units 09/04/2023    2:06 AM 07/16/2023   11:04 AM 09/05/2022    2:03 AM  BMP  Glucose 70 - 99 mg/dL 758  807  887   BUN 6 - 20 mg/dL 22  26  50   Creatinine 0.61 - 1.24 mg/dL 5.58  5.60  2.29   BUN/Creat Ratio 6 - 22 (calc)  6    Sodium 135 - 145 mmol/L 137  135  134   Potassium 3.5 - 5.1 mmol/L 4.5  3.8  5.0   Chloride 98 - 111 mmol/L 98  93  96   CO2 22 - 32 mmol/L 27  31  24    Calcium  8.9 - 10.3 mg/dL 8.5  9.2  8.8     Lab Results  Component Value Date   CHOL 175 08/17/2021   HDL 45 08/17/2021   LDLCALC 105 (H) 08/17/2021   TRIG 134 08/17/2021   CHOLHDL 3.9 08/17/2021    Medications Reviewed Today   Medications were not reviewed in this encounter       Assessment/Plan:   Diabetes: - Currently {CHL Controlled/Uncontrolled:2027012246} with most recent A1C of *** {Desc; above/below:16086} goal {CCM A1c HNJOD:78908453}. Medication adherence appears ***. Control is suboptimal due to ***.   - Last UACR *** - Patient denies personal or family history of multiple endocrine neoplasia type 2, medullary thyroid  cancer; personal history of pancreatitis or gallbladder disease.*** - Reviewed long term cardiovascular and renal outcomes of uncontrolled blood sugar - Reviewed goal A1c, goal fasting, and goal 2 hour post prandial glucose - Reviewed hypoglycemia management plan and the rule of 15*** - Reviewed dietary modifications including *** utilizing the healthy plate method, limiting portion size of carbohydrate foods, increasing intake of protein and non-starchy vegetables. Counseled patient to stay hydrated with water  throughout the day. - Reviewed lifestyle modifications including: aiming for 150 minutes of moderate intensity exercise every week. *** - Recommend to ***  - Recommend to check glucose twice daily: fasting and 2-hr PPG  ***. Counseled patient to bring glucometer or BG log to every appointment. - Next A1C due ***    Meets financial criteria for *** patient assistance program through ***. Will collaborate with provider, CPhT, and patient to pursue assistance.    Written patient instructions provided. Patient verbalized understanding of treatment plan.   Follow Up Plan:  Pharmacist *** PCP clinic visit in ***   Lorain Baseman, PharmD [facility] [phone]

## 2024-02-03 NOTE — Telephone Encounter (Unsigned)
 Copied from CRM [id number]. Topic: Appointments - Scheduling Inquiry for Clinic >> Feb 03, 2024  1:00 PM Tiffini S wrote: Reason for CRM: Patient called to cancel appointment on 02/03/24 due to transportation services- is out of insuling medication. Please call patient back to reschedule appointment [phone]/ [phone]

## 2024-02-03 NOTE — Progress Notes (Signed)
 Patient was scheduled for in person appt with pharmacy on 02/03/24 but he was not able to make it due to transportation. Called him at the end of the day to reschedule and address urgent needs. Patient reported that he was out of both of his insulins (Lantus  and Humalog ). He ran out yesterday. Reports he takes Lantus  20 units daily at 7-8PM and Humalog  15 units with breakfast. He is now using [facility] for medication delivery. He reports BG today have been controlled (he states 115-116 mg/dL). Did not have time to complete a full visit, but will collaborate with PCP to send new prescriptions for insulin  to [facility] today to prevent delays in care. Will outreach patient for full telephone appt on 02/06/24.   Patient does have a 3 mo supply of FL3+ CGM (and reciever) at home. He is hoping to bring this in person to get it set up. Offered to schedule on 02/17/24 at 1PM, but noted that patient has an endo appt that morning at 10:20AM. Patient was referred to me for diabetes, so he should prioritize attending his scheduled endocrinology appt. I will discuss this with him at our telephone visit on Friday.   Lorain Baseman, PharmD [facility] [phone]

## 2024-02-03 NOTE — Telephone Encounter (Signed)
 Copied from CRM [id number]. Topic: Clinical - Medication Refill >> Feb 03, 2024 12:54 PM Tiffini S wrote: Medication: insulin  lispro (HUMALOG  KWIKPEN) 100 UNIT/ML KwikPen  Has the patient contacted their pharmacy? No (Agent: If no, request that the patient contact the pharmacy for the refill. If patient does not wish to contact the pharmacy document the reason why and proceed with request.) (Agent: If yes, when and what did the pharmacy advise?)  This is the patient's preferred pharmacy:   [facility] - [place], [place] - [address] [place] [address] Phone: [phone] Fax: [phone]  Is this the correct pharmacy for this prescription? Yes If no, delete pharmacy and type the correct one.   Has the prescription been filled recently? Yes  Is the patient out of the medication? Yes  Has the patient been seen for an appointment in the last year OR does the patient have an upcoming appointment? Yes  Can we respond through MyChart? No, please call the patient at [phone]  Agent: Please be advised that Rx refills may take up to 3 business days. We ask that you follow-up with your pharmacy.

## 2024-02-03 NOTE — Telephone Encounter (Signed)
 Pt is seeing Endio 02/17/24. Please advise Keith [name]

## 2024-02-05 ENCOUNTER — Ambulatory Visit: Admitting: Physical Therapy

## 2024-02-05 VITALS — BP 140/77 | HR 78

## 2024-02-05 DIAGNOSIS — M6281 Muscle weakness (generalized): Secondary | ICD-10-CM

## 2024-02-05 DIAGNOSIS — R293 Abnormal posture: Secondary | ICD-10-CM

## 2024-02-05 DIAGNOSIS — R2689 Other abnormalities of gait and mobility: Secondary | ICD-10-CM

## 2024-02-05 DIAGNOSIS — R2681 Unsteadiness on feet: Secondary | ICD-10-CM
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
## 2024-02-06 ENCOUNTER — Other Ambulatory Visit: Payer: Self-pay

## 2024-02-06 NOTE — Progress Notes (Unsigned)
 02/06/2024 Name: Keith [name] MRN: [id number] DOB: 08/09/1965  No chief complaint on file.   Keith [name] is a 58 y.o. year old male who presented for a telephone visit.   They were referred to the pharmacist by their PCP for assistance in managing diabetes. PMH includes HTN, PAD s/p RBKA and LAKA, CAD (STEMI in 2021, CVA in 2022), ESRD on dialysis (M/W/F), GERD, T2DM, asthma, HLD, HIV,    Subjective: Patient was last seen by PCP, Lorice Shall, on 11/25/23. At last visit, ***  Today, patient presents in *** good spirits and presents without *** any assistance. ***Patient is accompanied by ***.    Care Team: Primary Care Provider: Paseda, Folashade R, FNP ; Next Scheduled Visit: *** {careteamprovider:27366}  Medication Access/Adherence  Current Pharmacy:  DARRYLE LONG - [facility] [address] [place] [address] Phone: [phone] Fax: [phone]  [id number] - [place], [place] - [address] AT [facility] OF [address] [place] [address] Phone: [phone] Fax: [phone]  [facility] - [place], [place] - [address] [place] [address] Phone: [phone] Fax: [phone]   Patient reports affordability concerns with their medications: {YES/NO:21197} Patient reports access/transportation concerns to their pharmacy: {YES/NO:21197} Patient reports adherence concerns with their medications:  {YES/NO:21197} ***  *** Patient denies adherence with medications, reports missing *** medications *** times per week, on average.   Diabetes:  Current medications: *** Medications tried in the past: ***  Current glucose readings: ***  Date of Download: *** % Time CGM is active: ***% Average Glucose: *** mg/dL Glucose Management Indicator: ***  Glucose Variability: *** (goal <36%) Time in Goal:  - Time in range 70-180: ***% - Time above  range: ***% - Time below range: ***% Observed patterns:  Patient {Actions; denies-reports:120008} hypoglycemic s/sx including ***dizziness, shakiness, sweating. Patient {Actions; denies-reports:120008} hyperglycemic symptoms including ***polyuria, polydipsia, polyphagia, nocturia, neuropathy, blurred vision.  Current meal patterns:  - Breakfast: *** - Lunch *** - Supper *** - Snacks *** - Drinks ***  Current physical activity: ***  Current medication access support: ***  Macrovascular and Microvascular Risk Reduction:  Statin? {DM Statin Pharmacy:33491}; ACEi/ARB? {DM ACEi/ARB pharmacy:33492} Last urinary albumin /creatinine ratio:  Lab Results  Component Value Date   MICRALBCREAT 8,237 (H) 08/22/2021   MICRALBCREAT 26.0 02/22/2014   MICRALBCREAT 75.9 (H) 04/10/2010   Last eye exam:   Last foot exam: 04/05/2010 Tobacco Use:  Tobacco Use: Low Risk  (01/29/2024)   Patient History    Smoking Tobacco Use: Never    Smokeless Tobacco Use: Never    Passive Exposure: Never     Objective:  BP Readings from Last 3 Encounters:  02/05/24 (!) 140/77  01/29/24 138/85  11/25/23 119/65    Lab Results  Component Value Date   HGBA1C 7.5 (A) 11/25/2023   HGBA1C 7.5 11/25/2023   HGBA1C 5.6 09/02/2022       Latest Ref Rng & Units 09/04/2023    2:06 AM 07/16/2023   11:04 AM 09/05/2022    2:03 AM  BMP  Glucose 70 - 99 mg/dL 758  807  887   BUN 6 - 20 mg/dL 22  26  50   Creatinine 0.61 - 1.24 mg/dL 5.58  5.60  2.29   BUN/Creat Ratio 6 - 22 (calc)  6    Sodium 135 - 145 mmol/L 137  135  134   Potassium  3.5 - 5.1 mmol/L 4.5  3.8  5.0   Chloride 98 - 111 mmol/L 98  93  96   CO2 22 - 32 mmol/L 27  31  24    Calcium  8.9 - 10.3 mg/dL 8.5  9.2  8.8     Lab Results  Component Value Date   CHOL 175 08/17/2021   HDL 45 08/17/2021   LDLCALC 105 (H) 08/17/2021   TRIG 134 08/17/2021   CHOLHDL 3.9 08/17/2021    Medications Reviewed Today   Medications were not reviewed in this  encounter       Assessment/Plan:   Diabetes: - Currently {CHL Controlled/Uncontrolled:[phone]} with most recent A1C of *** {Desc; above/below:16086} goal {CCM A1c HNJOD:[id number]}. Medication adherence appears ***. Control is suboptimal due to ***.   - Last UACR *** - Patient denies personal or family history of multiple endocrine neoplasia type 2, medullary thyroid  cancer; personal history of pancreatitis or gallbladder disease.*** - Reviewed long term cardiovascular and renal outcomes of uncontrolled blood sugar - Reviewed goal A1c, goal fasting, and goal 2 hour post prandial glucose - Reviewed hypoglycemia management plan and the rule of 15*** - Reviewed dietary modifications including *** utilizing the healthy plate method, limiting portion size of carbohydrate foods, increasing intake of protein and non-starchy vegetables. Counseled patient to stay hydrated with water  throughout the day. - Reviewed lifestyle modifications including: aiming for 150 minutes of moderate intensity exercise every week. *** - Recommend to ***  - Recommend to check glucose twice daily: fasting and 2-hr PPG ***. Counseled patient to bring glucometer or BG log to every appointment. - Next A1C due ***    Meets financial criteria for *** patient assistance program through ***. Will collaborate with provider, CPhT, and patient to pursue assistance.    Written patient instructions provided. Patient verbalized understanding of treatment plan.   Follow Up Plan: *** Pharmacist *** PCP clinic visit in ***   Lorain Baseman, PharmD [facility] [phone]

## 2024-02-06 NOTE — Telephone Encounter (Signed)
 Attempted to outreach patient as mentioned in previous note. Receiving error message that number is not in service, though I made successful contact earlier this week. Will request for population health pharmacy technician to follow-up to ensure that patient received insulin  and brings CGM supplies to Endo appointment on 02/17/24 for application. Will then reschedule with pharmacy.   Keith [name], PharmD [facility] [phone]

## 2024-02-09 ENCOUNTER — Encounter: Payer: Self-pay | Admitting: Radiology

## 2024-02-10 ENCOUNTER — Telehealth: Payer: Self-pay

## 2024-02-10 ENCOUNTER — Ambulatory Visit: Admitting: Physical Therapy

## 2024-02-10 ENCOUNTER — Telehealth: Payer: Self-pay | Admitting: Pharmacy Technician

## 2024-02-10 NOTE — Progress Notes (Unsigned)
 Complex Care Management Care Guide Note  02/10/2024 Name: Keith [name] MRN: [id number] DOB: 11-30-1965  Keith [name] is a 58 y.o. year old male who is a primary care patient of Paseda, Folashade R, FNP and is actively engaged with the care management team. I reached out to Keith [name] by phone today to assist with re-scheduling  with the Pharmacist.  Follow up plan: Unsuccessful telephone outreach attempt made. A HIPAA compliant phone message was left for the patient providing contact information and requesting a return call.  Leotis Rase [facility] Guide  Direct Dial : [phone]  Fax [phone]

## 2024-02-10 NOTE — Progress Notes (Signed)
   02/10/2024  [name], male   DOB: [birth year], 58 y.o.   MRN: [id number]  Patient engaged with clinical pharmacist for management of diabetes on 02/03/2024. Outreach by [employer] technician was requested.   Outreached patient to discuss diabetes medication management. When number is dialed a message comes up stating number is not in service.  Aundra Espin, CPhT [facility] Office: [phone] Email: [email or web address]

## 2024-02-11 ENCOUNTER — Telehealth: Payer: Self-pay | Admitting: Pharmacy Technician

## 2024-02-11 NOTE — Progress Notes (Signed)
   02/11/2024  [name], male   DOB: [birth year], 58 y.o.   MRN: [id number]  Patient engaged with clinical pharmacist for management of diabetes on 02/03/2024. Outreach by [employer] technician was requested.   Outreached patient to discuss diabetes medication management. Called number and a pre recorded message comes on stating number is not in service.  Romell Wolden, CPhT [facility] Office: [phone] Email: [email or web address]

## 2024-02-12 ENCOUNTER — Ambulatory Visit: Admitting: Physical Therapy

## 2024-02-12 NOTE — Progress Notes (Signed)
 Complex Care Management Care Guide Note  02/12/2024 Name: Keith [name] MRN: [id number] DOB: February 22, 1966  Keith [name] is a 58 y.o. year old male who is a primary care patient of Paseda, Folashade R, FNP and is actively engaged with the care management team. I reached out to Keith [name] by phone today to assist with re-scheduling  with the Pharmacist.  Follow up plan: Unsuccessful telephone outreach attempt made. A HIPAA compliant phone message was left for the patient providing contact information and requesting a return call.  Leotis Rase [facility], [facility] Guide  Direct Dial : [phone]  Fax [phone]

## 2024-02-13 ENCOUNTER — Telehealth: Payer: Self-pay | Admitting: Pharmacy Technician

## 2024-02-13 NOTE — Progress Notes (Addendum)
   02/13/2024  [name], male   DOB: Jun 17, 1965, 58 y.o.   MRN: [id number]  Patient engaged with clinical pharmacist for management of diabetes on 02/03/2024. Outreach by [employer] technician was requested.   Outreached patient to discuss diabetes medication management.  The number is not in service per a message that comes on when you dial  the number.  Lavenia Stumpo, CPhT [facility] Office: [phone] Email: [email or web address]

## 2024-02-16 ENCOUNTER — Telehealth: Payer: Self-pay | Admitting: Pharmacy Technician

## 2024-02-16 NOTE — Progress Notes (Signed)
   02/16/2024  Patient ID: Ozell DELENA Leaf, male   DOB: Aug 15, 1965, 58 y.o.   MRN: [id number]  Patient engaged with clinical pharmacist for management of diabetes on 02/03/2024. Outreach by [employer] technician was requested.   Outreached patient to discuss diabetes medication management. A pre recorded messages comes on stating phone number is not in service. Will send patient MyChart message today.  Skiler Olden, CPhT [facility] Office: [phone] Email: [email or web address]

## 2024-02-17 ENCOUNTER — Ambulatory Visit (INDEPENDENT_AMBULATORY_CARE_PROVIDER_SITE_OTHER): Payer: Medicare (Managed Care) | Admitting: "Endocrinology

## 2024-02-17 ENCOUNTER — Encounter: Payer: Self-pay | Admitting: "Endocrinology

## 2024-02-17 ENCOUNTER — Ambulatory Visit: Attending: Orthopedic Surgery | Admitting: Physical Therapy

## 2024-02-17 ENCOUNTER — Other Ambulatory Visit: Payer: Self-pay | Admitting: Nurse Practitioner

## 2024-02-17 VITALS — BP 144/79 | HR 83

## 2024-02-17 VITALS — BP 140/80 | Ht 70.0 in | Wt 270.0 lb

## 2024-02-17 DIAGNOSIS — R2689 Other abnormalities of gait and mobility: Secondary | ICD-10-CM | POA: Diagnosis present

## 2024-02-17 DIAGNOSIS — Z794 Long term (current) use of insulin: Secondary | ICD-10-CM | POA: Diagnosis not present

## 2024-02-17 DIAGNOSIS — E1165 Type 2 diabetes mellitus with hyperglycemia: Secondary | ICD-10-CM

## 2024-02-17 DIAGNOSIS — R2681 Unsteadiness on feet: Secondary | ICD-10-CM | POA: Diagnosis present

## 2024-02-17 DIAGNOSIS — Z7985 Long-term (current) use of injectable non-insulin antidiabetic drugs: Secondary | ICD-10-CM | POA: Diagnosis not present

## 2024-02-17 DIAGNOSIS — E7800 Pure hypercholesterolemia, unspecified: Secondary | ICD-10-CM | POA: Diagnosis not present

## 2024-02-17 DIAGNOSIS — R293 Abnormal posture: Secondary | ICD-10-CM | POA: Diagnosis present

## 2024-02-17 DIAGNOSIS — M6281 Muscle weakness (generalized): Secondary | ICD-10-CM | POA: Diagnosis present

## 2024-02-17 DIAGNOSIS — F419 Anxiety disorder, unspecified: Secondary | ICD-10-CM

## 2024-02-17 LAB — POCT GLYCOSYLATED HEMOGLOBIN (HGB A1C): Hemoglobin A1C: 11.2 % — AB (ref 4.0–5.6)

## 2024-02-17 MED ORDER — BAQSIMI ONE PACK 3 MG/DOSE NA POWD: 1.0000 | NASAL | 3 refills | Status: AC | PRN

## 2024-02-17 MED ORDER — FREESTYLE LIBRE 3 PLUS SENSOR MISC: 3 refills | Status: AC

## 2024-02-17 NOTE — Therapy (Signed)
 OUTPATIENT PHYSICAL THERAPY PROSTHETICS TREATMENT    Patient Name: SIVAN QUAST MRN: [id number] DOB:Mar 23, 1966, 58 y.o., male Today's Date: 02/17/2024  PCP: Paseda, Folashade R, FNP REFERRING PROVIDER: Harden Jerona GAILS, MD  END OF SESSION:  PT End of Session - 02/17/24 1233     Visit Number 3    Number of Visits 13    Date for Recertification  03/18/24    Authorization Type [employer] Dual Complete    PT Start Time 1231    PT Stop Time 1318    PT Time Calculation (min) 47 min    Equipment Utilized During Treatment Other (comment);Gait belt   R BKA and L AKA prosthetic   Activity Tolerance Patient tolerated treatment well    Behavior During Therapy WFL for tasks assessed/performed           Past Medical History:  Diagnosis Date   Abscess of right foot    abscess/ulcer of R transtibial amputation requiring IV abx   Acute ST elevation myocardial infarction (STEMI) due to occlusion of circumflex coronary artery (HCC) 03/22/2020   AIDS (HCC)    Anemia    CAD (coronary artery disease)    a. MI with stenting of OM1 in 11/2018 with residual disease. b. acute STEMI 03/2020 s/p DES to OM1   Chronic anemia    Chronic knee pain    right   Chronic pain    CKD (chronic kidney disease), stage IV (HCC)    CVA (cerebral vascular accident) (HCC) 04/2020   Diabetes type 2, uncontrolled    HgA1c 17.6 (04/27/2010)   Diabetic foot ulcer (HCC) 01/2017   right foot   Dilatation of aorta    Erectile dysfunction    Genital warts    GERD (gastroesophageal reflux disease)    History of blood transfusion    HIV (human immunodeficiency virus infection) (HCC) 2009   CD4 count 100, VL 13800 (05/01/2010)   Hyperlipidemia    Hypertension    Myocardial infarction [facility])    Neuropathy    Noncompliance with medication regimen    Osteomyelitis (HCC)    h/o hand   Osteomyelitis of metatarsal (HCC) 04/28/2017   Pneumonia    STEMI (ST elevation myocardial infarction) (HCC) 11/10/2018   Past Surgical  History:  Procedure Laterality Date   A/V FISTULAGRAM N/A 05/07/2023   Procedure: A/V Fistulagram;  Surgeon: Melia Lynwood ORN, MD;  [facility]: [facility] INVASIVE CV LAB;  Service: Cardiovascular;  Laterality: N/A;   AMPUTATION Right 05/02/2017   Procedure: AMPUTATION TRANSMETARSAL;  Surgeon: Harden Jerona GAILS, MD;  Location: [facility] OR;  Service: Orthopedics;  Laterality: Right;   AMPUTATION Right 06/13/2017   Procedure: RIGHT BELOW KNEE AMPUTATION;  Surgeon: Harden Jerona GAILS, MD;  Location: [facility] OR;  Service: Orthopedics;  Laterality: Right;   AMPUTATION Left 10/27/2020   Procedure: LEFT BELOW KNEE AMPUTATION;  Surgeon: Harden Jerona GAILS, MD;  Location: [facility] OR;  Service: Orthopedics;  Laterality: Left;   AMPUTATION Left 11/17/2020   Procedure: REVISION AMPUTATION BELOW KNEE, LEFT;  Surgeon: Harden Jerona GAILS, MD;  Location: [facility] OR;  Service: Orthopedics;  Laterality: Left;   AMPUTATION Left 01/03/2021   Procedure: LEFT ABOVE KNEE AMPUTATION;  Surgeon: Harden Jerona GAILS, MD;  Location: [facility] OR;  Service: Orthopedics;  Laterality: Left;   APPLICATION OF WOUND VAC Left 10/27/2020   Procedure: APPLICATION OF WOUND VAC;  Surgeon: Harden Jerona GAILS, MD;  Location: [facility] OR;  Service: Orthopedics;  Laterality: Left;   AV FISTULA PLACEMENT Left 03/14/2022  Procedure: LEFT BRACHIOCEPHALIC ARTERIOVENOUS (AV) FISTULA CREATION;  Surgeon: Lanis Fonda BRAVO, MD;  Location: [facility] OR;  Service: Vascular;  Laterality: Left;  PERIPHERAL NERVE BLOCK   BELOW KNEE LEG AMPUTATION Right 06/13/2017   BELOW KNEE LEG AMPUTATION     CORONARY STENT INTERVENTION N/A 11/10/2018   Procedure: CORONARY STENT INTERVENTION;  Surgeon: Anner Alm ORN, MD;  Location: [facility] INVASIVE CV LAB;  Service: Cardiovascular;  Laterality: N/A;   CORONARY/GRAFT ACUTE MI REVASCULARIZATION N/A 03/21/2020   Procedure: Coronary/Graft Acute MI Revascularization;  Surgeon: Court Dorn PARAS, MD;  [facility]: [facility];  Service: Cardiovascular;  Laterality: N/A;   CYSTOSCOPY N/A 11/09/2020    Procedure: CYSTOSCOPY, EVACUATION OF CLOTS, FULGERATION OF BLADDER AND BILATERIAL RETROGRADE PYELOGRAMS;  Surgeon: Selma Donnice SAUNDERS, MD;  Location: [facility] OR;  Service: Urology;  Laterality: N/A;   HERNIA REPAIR     I & D EXTREMITY Left 08/21/2014   Procedure: INCISION AND DRAINAGE LEFT SMALL FINGER;  Surgeon: Franky Curia, MD;  Location: [facility] OR;  Service: Orthopedics;  Laterality: Left;   I & D EXTREMITY Right 03/18/2017   Procedure: IRRIGATION AND DEBRIDEMENT EXTREMITY;  Surgeon: Harden Jerona GAILS, MD;  Location: [facility] OR;  Service: Orthopedics;  Laterality: Right;   IR FLUORO GUIDE CV LINE RIGHT  11/02/2020   IR FLUORO GUIDE CV LINE RIGHT  03/12/2022   IR FLUORO GUIDE CV LINE RIGHT  09/03/2022   IR FLUORO RM 30-60 MIN  09/04/2022   IR REMOVAL TUN CV CATH W/O FL  11/13/2020   IR US  GUIDE VASC ACCESS RIGHT  11/02/2020   IR US  GUIDE VASC ACCESS RIGHT  03/12/2022   IR US  GUIDE VASC ACCESS RIGHT  09/03/2022   IR VENOCAVAGRAM SVC  03/12/2022   LEFT HEART CATH AND CORONARY ANGIOGRAPHY N/A 11/10/2018   Procedure: LEFT HEART CATH AND CORONARY ANGIOGRAPHY;  Surgeon: Anner Alm ORN, MD;  Location: [facility] INVASIVE CV LAB;  Service: Cardiovascular;  Laterality: N/A;   LEFT HEART CATH AND CORONARY ANGIOGRAPHY N/A 03/21/2020   Procedure: LEFT HEART CATH AND CORONARY ANGIOGRAPHY;  Surgeon: Court Dorn PARAS, MD;  [facility]: [facility];  Service: Cardiovascular;  Laterality: N/A;   MINOR IRRIGATION AND DEBRIDEMENT OF WOUND Right 04/22/2014   Procedure: IRRIGATION AND DEBRIDEMENT OF RIGHT NECK ABCESS;  Surgeon: Alm Bouche, MD;  Location[facility] OR;  Service: ENT;  Laterality: Right;   MULTIPLE EXTRACTIONS WITH ALVEOLOPLASTY N/A 01/18/2013   Procedure: MULTIPLE EXTRACION 3, 6, 7, 10, 11, 13, 21, 22, 27, 28, 29, 30 WITH ALVEOLOPLASTY;  Surgeon: Glendia CHRISTELLA Primrose, DDS;  Location: [facility] OR;  Service: Oral Surgery;  Laterality: N/A;   SKIN SPLIT GRAFT Right 03/21/2017   Procedure: IRRIGATION AND DEBRIDEMENT RIGHT FOOT AND APPLY SPLIT  THICKNESS SKIN GRAFT AND WOUND VAC;  Surgeon: Harden Jerona GAILS, MD;  Location: [facility] OR;  Service: Orthopedics;  Laterality: Right;   STUMP REVISION Left 12/02/2020   Procedure: REVISION LEFT BELOW KNEE AMPUTATION;  Surgeon: Harden Jerona GAILS, MD;  Location: [facility] OR;  Service: Orthopedics;  Laterality: Left;   TEE WITHOUT CARDIOVERSION N/A 05/02/2017   Procedure: TRANSESOPHAGEAL ECHOCARDIOGRAM (TEE);  Surgeon: Alveta Aleene PARAS, MD;  Location: [facility] OR;  Service: Cardiovascular;  Laterality: N/A;  coincidental to orthopedic case   Patient Active Problem List   Diagnosis Date Noted   Calculus of urinary tract in diseases classified elsewhere 11/26/2023   Impaired mobility and activities of daily living 11/25/2023   Asthma 11/25/2023   Iron deficiency anemia, unspecified 04/22/2023  Disorder of phosphorus metabolism, unspecified 03/07/2023   Dialysis AV fistula malfunction 09/04/2022   Dialysis AV fistula malfunction, initial encounter 09/02/2022   Hyperkalemia, diminished renal excretion 09/02/2022   ESRD on dialysis (HCC) 09/02/2022   AKI (acute kidney injury) 03/09/2022   Gallbladder anomaly 03/09/2022   Fall at home, initial encounter 12/29/2021   Acute cystitis 12/29/2021   History of anemia due to chronic kidney disease 12/29/2021   Nonketotic hyperglycinemia, type II 12/18/2021   Asymptomatic bacteriuria 12/18/2021   Type 2 diabetes mellitus with hyperosmolar nonketotic hyperglycemia (HCC) 12/06/2021   Hyperglycemia 12/06/2021   BPH (benign prostatic hyperplasia)    GERD (gastroesophageal reflux disease)    Headache 08/22/2021   Anxiety 02/13/2021   Healthcare maintenance 02/13/2021   History of left above knee amputation (HCC) 02/07/2021   Wound dehiscence 01/03/2021   Coagulopathy 12/22/2020   CAD (coronary artery disease) 12/22/2020   Obesity 12/22/2020   Ulcer of heel due to diabetes mellitus (HCC)    Anemia    Hyperglycemia due to diabetes mellitus (HCC) 08/29/2020   Type 2 diabetes  mellitus with hyperlipidemia (HCC)    PAD (peripheral artery disease)    Uncontrolled type 2 diabetes mellitus with hyperglycemia, with long-term current use of insulin  with renal complication 01/14/2019   Elevated glucose 01/14/2019   Hx of BKA, right (HCC) 01/14/2019   Pressure injury of skin 11/11/2018   CKD (chronic kidney disease) stage 4, GFR 15-29 ml/min (HCC) 11/10/2018   Amputation stump infection (HCC) 10/28/2018   Type II diabetes mellitus with renal manifestations (HCC) 08/07/2018   Uncontrolled type 2 diabetes mellitus with hyperosmolar nonketotic hyperglycemia (HCC) 08/07/2018   Non-pressure chronic ulcer of right calf limited to breakdown of skin (HCC) 07/06/2018   Type 2 diabetes mellitus without complication, without long-term current use of insulin  (HCC) 06/15/2018   Chronic low back pain without sciatica 06/15/2018   Idiopathic chronic venous hypertension of left lower extremity with ulcer and inflammation (HCC)    Venous stasis ulcer of left calf limited to breakdown of skin without varicose veins (HCC) 04/23/2018   Acquired absence of right leg below knee (HCC) 06/13/2017   Diabetic polyneuropathy associated with type 2 diabetes mellitus (HCC) 01/23/2017   Diarrhea 09/28/2016   Nausea vomiting and diarrhea 09/28/2016   Onychomycosis of multiple toenails with type 2 diabetes mellitus (HCC) 08/29/2015   MRSA carrier 04/20/2014   Penile wart 02/22/2014   HIV disease (HCC)    Insulin -requiring or dependent type II diabetes mellitus (HCC) 02/04/2014   Dental anomaly 11/20/2012   Arthritis of right knee 02/23/2012   Hyperlipidemia 11/10/2011   Hyponatremia 11/10/2011   Chronic pain 08/07/2011   Meralgia paraesthetica 04/23/2011   ERECTILE DYSFUNCTION 08/22/2008   HTN (hypertension) 05/19/2008    ONSET DATE: 12/12/2023 (referral)   REFERRING DIAG: Z89.511 (ICD-10-CM) - Hx of right BKA (HCC) D21.887J (ICD-10-CM) - Unilateral AKA, left (HCC)  THERAPY DIAG:   Unsteadiness on feet  Abnormal posture  Muscle weakness (generalized)  Other abnormalities of gait and mobility  Rationale for Evaluation and Treatment: Rehabilitation  SUBJECTIVE:   SUBJECTIVE STATEMENT: Pt presents in [place] power chair, wearing R BKA prosthetic and blue sock and holding L AKA prosthetic. States he was unable to make transportation for this Thursday, was told they could not schedule due to a holiday today. Has an appointment w/Rehab [facility] today to look at his WC.   Denies falls or acute changes   Gets dialysis M/W/F    Pt accompanied by: self  PERTINENT HISTORY: coronary atherosclerotic heart disease, ST elevation MI, HIV, chronic pain syndrome on Percocet, CVA, diabetes, hyperlipidemia, hypertension, ESRD. R BKA (2020), L AKA (2022)  PAIN:  Are you having pain? No  PRECAUTIONS: Fall and Other: Fistula in LUE   RED FLAGS: None   WEIGHT BEARING RESTRICTIONS: No  FALLS: Has patient fallen in last 6 months? No  LIVING ENVIRONMENT: Lives with: lives alone Lives in: House/apartment Home Access: Level entry Home layout: One level Stairs: No Has following equipment at home: Wheelchair (manual), shower chair, Grab bars, and Hospital bed   PLOF: Requires assistive device for independence and Needs assistance with homemaking  PATIENT GOALS: I wanna learn how to walk. I want one day I can walk outta here without this WC I wanna go back to work   OBJECTIVE:  Note: Objective measures were completed at Evaluation unless otherwise noted.  DIAGNOSTIC FINDINGS: None relevant on file   COGNITION: Overall cognitive status: Difficulty to assess due to: no family present   SENSATION: Pt denies numbness/tingling in all extremities    POSTURE: rounded shoulders, forward head, increased thoracic kyphosis, and posterior pelvic tilt  LOWER EXTREMITY ROM:  Active ROM Right eval Left eval  Hip flexion    Hip extension    Hip abduction    Hip adduction     Hip internal rotation    Hip external rotation    Knee flexion    Knee extension    Ankle dorsiflexion    Ankle plantarflexion    Ankle inversion    Ankle eversion     (Blank rows = not tested)  LOWER EXTREMITY MMT:  MMT Right eval Left eval  Hip flexion    Hip extension    Hip abduction    Hip adduction    Hip internal rotation    Hip external rotation    Knee flexion    Knee extension    Ankle dorsiflexion    Ankle plantarflexion    Ankle inversion    Ankle eversion    (Blank rows = not tested)  BED MOBILITY:  Pt reports independence w/this, has a hospital bed and uses this occasionally   TRANSFERS: Unable to assess on eval   GAIT: pt non-ambulatory at this time   FUNCTIONAL TESTS:  Unable to assess on eval due to lack of access to LLE to get leg on   CURRENT PROSTHETIC WEAR ASSESSMENT: R BKA  Patient is independent with: skin check, residual limb care, care of non-amputated limb, prosthetic cleaning, ply sock cleaning, and proper wear schedule/adjustment Patient is dependent with: correct ply sock adjustment and proper weight-bearing schedule/adjustment Donning prosthesis: Complete Independence Doffing prosthesis: Complete Independence Prosthetic wear tolerance: 0.5-1 hours/day, 7 days/week Prosthetic weight bearing tolerance: 10 minutes Edema: pt denies edema  Residual limb condition: not assessed on eval  Prosthetic description: pin lock BKA w/SACH foot  K code/activity level with prosthetic use: Level 1  CURRENT PROSTHETIC WEAR ASSESSMENT: L AKA Patient is independent with: skin check, residual limb care, and care of non-amputated limb Patient is dependent with: correct ply sock adjustment, proper wear schedule/adjustment, and proper weight-bearing schedule/adjustment Donning prosthesis: Total A Doffing prosthesis: Total A Prosthetic wear tolerance: 0 hours/day, 0 days/week Prosthetic weight bearing tolerance: 0 minutes Edema: Unable to assess on  eval  Residual limb condition: unable to assess on eval  Prosthetic description: Lanyard lock ischial containment w/single axis knee and SACH foot  K code/activity level with prosthetic use: Level 1   VITALS  Vitals:   02/17/24 1244  BP: (!) 144/79  Pulse: 83                                                                                                                                 TREATMENT:  Self-care/home management Pt asking if he can drive to appointment Thursday and have office come get him out of the car w/WC. Advised against pt driving at this time due to safety concerns and pt verbalized agreement and understanding.   Assessed vitals in RUE (see above) and WNL  Ther Act  Pt performed squat pivot transfer from [place] chair to mat table on R side w/R BKA donned and SBA  Had pt practice donning L liner on leg w/max multimodal cues for proper technique (pt tends to pull liner on leg rather than roll it).  Pt wearing 5ply sock on RLE and no socks on LLE. Pt able to don L AKA prosthetic w/min cues from therapist to ensure proper alignment of lanyard.  From mat table to RW, pt performed sit to stand w/min A and mod verbal cues for proper hand placement. Pt ambualted 22' w/RW and min A w/close WC follow. Noted L prosthetic too large on pt's leg and on final step, prosthetic slipped down off pt's leg and pt required mod A x2 to prevent fall.  Lengthy discussion regarding safety concerns due to poor insight into deficits and poor prosthetic management. Pt has appointment w/[facility] w/next and encouraged pt to inquire about sock management as well as proper fit of prosthetics.  Pt performed stand pivot transfer from arm chair to [facility] w/mod A x2 and RW.  Pt practiced donning second 5ply sock on RLE while seated in WC and continued to provide education on proper sock management. Pt reports he does not understand why or when he needs socks, so will continue to work on this in PT.    PATIENT  EDUCATION: Education details: Proper sock management, safety concerns with pt driving.  Person educated: Patient Education method: Explanation, Demonstration, Tactile cues, Verbal cues, and Handouts Education comprehension: verbalized understanding, returned demonstration, verbal cues required, tactile cues required, and needs further education  HOME EXERCISE PROGRAM: To be established   ASSESSMENT:  CLINICAL IMPRESSION: Emphasis of skilled PT session on pt education regarding prosthetic and sock management and initiating gait training. Pt reports he was unable to set up transportation for Thursday's PT appointment and would like to drive himself, but will need assistance getting into/out of car. Informed pt of safety concerns with him driving as pt unable to manage his power chair or WC independently and is a high fall risk, so recommended pt not drive. Pt has very poor safety awareness and requires max A to safely manage his prostheses and socks. Pt's WC also broken and not sized properly and has appointment w/Rehab Med today. Continue POC.    OBJECTIVE IMPAIRMENTS: Abnormal gait, decreased activity tolerance, decreased balance,  decreased cognition, decreased endurance, decreased knowledge of condition, decreased knowledge of use of DME, decreased mobility, difficulty walking, decreased strength, decreased safety awareness, impaired perceived functional ability, prosthetic dependency , and obesity  ACTIVITY LIMITATIONS: standing, squatting, stairs, transfers, bathing, hygiene/grooming, and locomotion level  PARTICIPATION LIMITATIONS: meal prep, cleaning, laundry, driving, shopping, community activity, occupation, and yard work  PERSONAL FACTORS: Behavior pattern, Past/current experiences, Transportation, and 1-2 comorbidities: L AKA and R BKA are also affecting patient's functional outcome.   REHAB POTENTIAL: Fair Due to medical complexity and time since amputations   CLINICAL DECISION  MAKING: Evolving/moderate complexity  EVALUATION COMPLEXITY: Moderate   GOALS: Goals reviewed with patient? Yes  SHORT TERM GOALS: Target date: 02/26/2024   Pt will be independent with initial HEP for improved strength, balance, weight bearing tolerance and transfers.  Baseline:not established on eval  Goal status: INITIAL  2.  Pt will be independent w/donning and doffing L AKA prosthetic for improved safety at home and progression towards gait training  Baseline: unable to don on his own  Goal status: INITIAL  3.  Pt will be independent w/sock management for reduced risk of skin breakdown and improved independence w/prosthetic management  Baseline: education provided on eval  Goal status: INITIAL  4.  Pt will initiate gait training as able w/LRAD for improved independence and functional strength  Baseline: 22' w/RW and min A x2 Goal status: MET   LONG TERM GOALS: Target date: 03/11/2024   Pt will be independent with final HEP for improved strength, balance, weight bearing tolerance and transfers.  Baseline:  Goal status: INITIAL  2.  Pt will improve wear schedule of L and R prosthetics to >/= 4 hours per day for improved independence and wear tolerance  Baseline: <1 hour per day  Goal status: INITIAL  3.  Gait training goal to be updated as appropriate  Baseline:  Goal status: INITIAL   PLAN:  PT FREQUENCY: 2x/week  PT DURATION: 6 weeks  PLANNED INTERVENTIONS: [id number]- PT Re-evaluation, 97750- Physical Performance Testing, 97110-Therapeutic exercises, 97530- Therapeutic activity, [id number]- Neuromuscular re-education, 97535- Self Care, [id number]- Manual therapy, [id number]- Gait training, [phone]- Prosthetic Initial , [phone]- Orthotic/Prosthetic subsequent, [phone] (1-2 muscles), [phone] (3+ muscles)- Dry Needling, Patient/Family education, Balance training, Stair training, Joint mobilization, Spinal mobilization, Vestibular training, and DME instructions  PLAN FOR NEXT SESSION: Monitor  vitals. Continued education on sock management, donning/doffing L prosthetic, sink HEP and building up wear tolerance. Did he get more socks from [facility]? Stand at ballet bar or // bars. Try donning legs on mat table. Supine glute exercises    Marlon BRAVO Destry Bezdek, PT, DPT 02/17/2024, 1:24 PM

## 2024-02-17 NOTE — Patient Instructions (Addendum)
 Will recommend the following: Trulicity  4.5 mg/week  Lantus  20 units qam Novolog  10 units 15 min before meals (do not take it if sugar is less than 100 or skipping meals)   Novolog  correction scale: Use in addition to your meal time/short acting insulin  based on blood sugars as follows:  151 - 180: 1 unit 181 - 210: 2 units 211 - 240: 3 units 241 - 270: 4 units 271 - 300: 5 units 301 - 330: 6 units 331 - 360: 7 units 361 - 390: 8 units 391 - 420: 9 units   __________   Goals of DM therapy:  Morning Fasting blood sugar: 80-140  Blood sugar before meals: 80-140 Bed time blood sugar: 100-150  A1C <7%, limited only by hypoglycemia  1.Diabetes medications and their side effects discussed, including hypoglycemia    2. Check blood glucose:  a) Always check blood sugars before driving. Please see below (under hypoglycemia) on how to manage b) Check a minimum of 3 times/day or more as needed when having symptoms of hypoglycemia.   c) Try to check blood glucose before sleeping/in the middle of the night to ensure that it is remaining stable and not dropping less than 100 d) Check blood glucose more often if sick  3. Diet: a) 3 meals per day schedule b: Restrict carbs to 60-70 grams (4 servings) per meal c) Colorful vegetables - 3 servings a day, and low sugar fruit 2 servings/day Plate control method: 1/4 plate protein, 1/4 starch, 1/2 green, yellow, or red vegetables d) Avoid carbohydrate snacks unless hypoglycemic episode, or increased physical activity  4. Regular exercise as tolerated, preferably 3 or more hours a week  5. Hypoglycemia: a)  Do not drive or operate machinery without first testing blood glucose to assure it is over 90 mg%, or if dizzy, lightheaded, not feeling normal, etc, or  if foot or leg is numb or weak. b)  If blood glucose less than 70, take four 5gm Glucose tabs or 15-30 gm Glucose gel.  Repeat every 15 min as needed until blood sugar is >100 mg/dl. If  hypoglycemia persists then call 911.   6. Sick day management: a) Check blood glucose more often b) Continue usual therapy if blood sugars are elevated.   7. Contact the doctor immediately if blood glucose is frequently <60 mg/dl, or an episode of severe hypoglycemia occurs (where someone had to give you glucose/  glucagon or if you passed out from a low blood glucose), or if blood glucose is persistently >350 mg/dl, for further management  8. A change in level of physical activity or exercise and a change in diet may also affect your blood sugar. Check blood sugars more often and call if needed.  Instructions: 1. Bring glucose meter, blood glucose records on every visit for review 2. Continue to follow up with primary care physician and other providers for medical care 3. Yearly eye  and foot exam 4. Please get blood work done prior to the next appointment

## 2024-02-17 NOTE — Progress Notes (Signed)
 Outpatient Endocrinology Note Keith Birmingham, MD  02/17/24   Keith [name] January 15, 1966 [id number]  Referring Provider: Myrna Camelia HERO, NP Primary Care Provider: Paseda, Folashade R, FNP Reason for consultation: Subjective   Assessment & Plan  Diagnoses and all orders for this visit:  Uncontrolled type 2 diabetes mellitus with hyperglycemia (HCC) -     POCT glycosylated hemoglobin (Hb A1C) -     Ambulatory referral to diabetic education -     Continuous Glucose Sensor (FREESTYLE LIBRE 3 PLUS SENSOR) MISC; Change sensor every 15 days. -     Lipid panel -     Microalbumin / creatinine urine ratio -     Comprehensive metabolic panel with GFR  Long-term (current) use of injectable non-insulin  antidiabetic drugs  Long-term insulin  use (HCC)  Insulin  dose changed (HCC)  Pure hypercholesterolemia  Other orders -     Glucagon (BAQSIMI ONE PACK) 3 MG/DOSE POWD; Place 1 Device into the nose as needed (Low blood sugar with impaired consciousness).    Diabetes Type II complicated by B/L amputation (R is below knee, L is above knee), No results found for: GFR Hba1c goal less than 7, current Hba1c is  Lab Results  Component Value Date   HGBA1C 11.2 (A) 02/17/2024   Will recommend the following: Trulicity  4.5 mg/week  Lantus  20 units qam Novolog  10 units 15 min before meals (do not take it if sugar is less than 100 or skipping meals)   Novolog  correction scale: Use in addition to your meal time/short acting insulin  based on blood sugars as follows:  151 - 180: 1 unit 181 - 210: 2 units 211 - 240: 3 units 241 - 270: 4 units 271 - 300: 5 units 301 - 330: 6 units 331 - 360: 7 units 361 - 390: 8 units 391 - 420: 9 units   Patient is going to come tomorrow for labs as well as setting up CGM Ordered DM education  02/17/24: asked PCP if is he supposed to be on both escitalopram  and duloxetine ? I am making sure its not a mistake as it increases the risk of serotonin  syndrome   No known contraindications/side effects to any of above medications Glucagon/baqsimi  discussed and prescribed with refills on 02/17/24   -Last LD and Tg are as follows: Lab Results  Component Value Date   LDLCALC 105 (H) 08/17/2021    Lab Results  Component Value Date   TRIG 134 08/17/2021   -On rosuvastatin  40 mg every day, omega 3 1000 mg Cap QD -Follow low fat diet and exercise   -Blood pressure goal <140/90 - Microalbumin/creatinine goal is < 30 -Last MA/Cr is as follows: Lab Results  Component Value Date   MICROALBUR 2,924.1 (H) 08/22/2021   -not on ACE/ARB  -diet changes including salt restriction -limit eating outside -counseled BP targets per standards of diabetes care -uncontrolled blood pressure can lead to retinopathy, nephropathy and cardiovascular and atherosclerotic heart disease  Reviewed and counseled on: -A1C target -Blood sugar targets -Complications of uncontrolled diabetes  -Checking blood sugar before meals and bedtime and bring log next visit -All medications with mechanism of action and side effects -Hypoglycemia management: rule of 15's, Glucagon Emergency Kit and medical alert ID -low-carb low-fat plate-method diet -At least 20 minutes of physical activity per day -Annual dilated retinal eye exam and foot exam -compliance and follow up needs -follow up as scheduled or earlier if problem gets worse  Call if blood sugar is less  than 70 or consistently above 250    Take a 15 gm snack of carbohydrate at bedtime before you go to sleep if your blood sugar is less than 100.    If you are going to fast after midnight for a test or procedure, ask your physician for instructions on how to reduce/decrease your insulin  dose.    Call if blood sugar is less than 70 or consistently above 250  -Treating a low sugar by rule of 15  (15 gms of sugar every 15 min until sugar is more than 70) If you feel your sugar is low, test your sugar to be  sure If your sugar is low (less than 70), then take 15 grams of a fast acting Carbohydrate (3-4 glucose tablets or glucose gel or 4 ounces of juice or regular soda) Recheck your sugar 15 min after treating low to make sure it is more than 70 If sugar is still less than 70, treat again with 15 grams of carbohydrate          Don't drive the hour of hypoglycemia  If unconscious/unable to eat or drink by mouth, use glucagon injection or nasal spray baqsimi and call 911. Can repeat again in 15 min if still unconscious.  Return in about 2 weeks (around 03/02/2024) for visit and 8 am labs tomorrow .   I have reviewed current medications, nurse's notes, allergies, vital signs, past medical and surgical history, family medical history, and social history for this encounter. Counseled patient on symptoms, examination findings, lab findings, imaging results, treatment decisions and monitoring and prognosis. The patient understood the recommendations and agrees with the treatment plan. All questions regarding treatment plan were fully answered.  Keith Birmingham, MD  02/17/24    History of Present Illness Keith [name] is a 58 y.o. year old male who presents for evaluation of Type II diabetes mellitus.  Keith [name] was first diagnosed around 2013.   Diabetes education -  Home diabetes regimen: Lantus  20 units qam Novolog  20 units at bedtime  Trulicity  4.5 mg/week   COMPLICATIONS -  MI/Stroke ?  retinopathy -  neuropathy +  nephropathy on HD M/W/F  SYMPTOMS REVIEWED - Polyuria - unintentional weight loss - Blurred vision  BLOOD SUGAR DATA Did not bring meter  Physical Exam  BP (!) 140/80   Ht 5' 10 (1.778 m)   Wt 270 lb (122.5 kg)   BMI 38.74 kg/m    Constitutional: well developed, well nourished Head: normocephalic, atraumatic Eyes: sclera anicteric, no redness Neck: supple Lungs: normal respiratory effort Neurology: alert and oriented Skin: dry, no appreciable  rashes Musculoskeletal: no appreciable defects Psychiatric: normal mood and affect Diabetic Foot Exam - Simple   No data filed      Current Medications Patient's Medications  New Prescriptions   CONTINUOUS GLUCOSE SENSOR (FREESTYLE LIBRE 3 PLUS SENSOR) MISC    Change sensor every 15 days.   GLUCAGON (BAQSIMI ONE PACK) 3 MG/DOSE POWD    Place 1 Device into the nose as needed (Low blood sugar with impaired consciousness).  Previous Medications   ACETAMINOPHEN  (TYLENOL ) 500 MG TABLET    Take 500 mg by mouth every 6 (six) hours as needed for fever, headache or moderate pain.   ALBUTEROL  (VENTOLIN  HFA) 108 (90 BASE) MCG/ACT INHALER    Inhale 2 puffs into the lungs every 6 (six) hours as needed for wheezing or shortness of breath.   ALLOPURINOL  (ZYLOPRIM ) 100 MG TABLET    Take 1  tablet (100 mg total) by mouth daily.   ASPIRIN  EC 81 MG TABLET    Take 81 mg by mouth daily.   BICTEGRAVIR-EMTRICITABINE -TENOFOVIR  AF (BIKTARVY ) 50-200-25 MG TABS TABLET    Take 1 tablet by mouth daily. Try to take at the same time each day with or without food.   BLOOD GLUCOSE MONITORING SUPPL DEVI    1 each by Does not apply route in the morning, at noon, and at bedtime. May substitute to any manufacturer covered by patient's insurance.   CALCIUM  ACETATE (PHOSLO ) 667 MG CAPSULE    Take 2 capsules (1,334 mg total) by mouth 3 (three) times daily with meals.   CHOLECALCIFEROL (VITAMIN D3) 25 MCG (1000 UNIT) TABLET    Take 1 tablet (1,000 Units total) by mouth daily.   CLOPIDOGREL  (PLAVIX ) 75 MG TABLET    Take 1 tablet (75 mg total) by mouth daily.   CONTINUOUS GLUCOSE RECEIVER (FREESTYLE LIBRE 3 READER) DEVI    1 each by Does not apply route as directed.   CONTINUOUS GLUCOSE SENSOR (FREESTYLE LIBRE 3 PLUS SENSOR) MISC    Change sensor every 15 days.   DAPSONE  100 MG TABLET    Take 1 tablet (100 mg total) by mouth daily.   DULAGLUTIDE  (TRULICITY ) 4.5 MG/0.5ML SOAJ    Inject 4.5 mg as directed once a week.   DULOXETINE   (CYMBALTA ) 60 MG CAPSULE    Take 60 mg by mouth every evening.   ESCITALOPRAM  (LEXAPRO ) 20 MG TABLET    Take 1 tablet (20 mg total) by mouth daily.   FAMOTIDINE  (PEPCID ) 10 MG TABLET    Take 1 tablet (10 mg total) by mouth 2 (two) times daily as needed for heartburn or indigestion.   FINASTERIDE  (PROSCAR ) 5 MG TABLET    Take 1 tablet (5 mg total) by mouth every evening.   GLUCOSE BLOOD (BLOOD GLUCOSE TEST STRIPS) STRP    1 each by In Vitro route in the morning, at noon, and at bedtime. May substitute to any manufacturer covered by patient's insurance.   INSULIN  GLARGINE (LANTUS  SOLOSTAR) 100 UNIT/ML SOLOSTAR PEN    Inject 20 Units into the skin daily.   INSULIN  LISPRO (HUMALOG  KWIKPEN) 100 UNIT/ML KWIKPEN    Inject 10 Units into the skin 2 (two) times daily with a meal.   LANCETS (ONETOUCH ULTRASOFT) LANCETS    Use as instructed   MIDODRINE  (PROAMATINE ) 10 MG TABLET    Take 1 tablet (10 mg total) by mouth 3 (three) times daily.   MULTIPLE VITAMIN (MULTIVITAMIN) TABLET    Take 1 tablet by mouth daily.   MULTIVITAMIN (RENA-VIT) TABS TABLET    Take 1 tablet by mouth daily.   OMEGA 3 1000 MG CAPS    Take 1 capsule (1,000 mg total) by mouth daily.   OXYCODONE  HCL 10 MG TABS    Take 10 mg by mouth every 4 (four) hours as needed (pain).   POLYETHYLENE GLYCOL (MIRALAX ) 17 G PACKET    Take 17 g by mouth daily as needed for mild constipation or moderate constipation.   ROSUVASTATIN  (CRESTOR ) 40 MG TABLET    Take 1 tablet (40 mg total) by mouth daily.   TAMSULOSIN  (FLOMAX ) 0.4 MG CAPS CAPSULE    Take 1 capsule (0.4 mg total) by mouth daily after supper.  Modified Medications   No medications on file  Discontinued Medications   No medications on file    Allergies Allergies  Allergen Reactions   Elemental Sulfur Itching  Metformin  And Related Other (See Comments)    Stopped by MD- affected kidneys   Zetia  [Ezetimibe ] Nausea Only   Sulfa Antibiotics Itching    Past Medical History Past Medical  History:  Diagnosis Date   Abscess of right foot    abscess/ulcer of R transtibial amputation requiring IV abx   Acute ST elevation myocardial infarction (STEMI) due to occlusion of circumflex coronary artery (HCC) 03/22/2020   AIDS (HCC)    Anemia    CAD (coronary artery disease)    a. MI with stenting of OM1 in 11/2018 with residual disease. b. acute STEMI 03/2020 s/p DES to OM1   Chronic anemia    Chronic knee pain    right   Chronic pain    CKD (chronic kidney disease), stage IV (HCC)    CVA (cerebral vascular accident) (HCC) 04/2020   Diabetes type 2, uncontrolled    HgA1c 17.6 (04/27/2010)   Diabetic foot ulcer (HCC) 01/2017   right foot   Dilatation of aorta    Erectile dysfunction    Genital warts    GERD (gastroesophageal reflux disease)    History of blood transfusion    HIV (human immunodeficiency virus infection) (HCC) 2009   CD4 count 100, VL 13800 (05/01/2010)   Hyperlipidemia    Hypertension    Myocardial infarction [facility])    Neuropathy    Noncompliance with medication regimen    Osteomyelitis (HCC)    h/o hand   Osteomyelitis of metatarsal (HCC) 04/28/2017   Pneumonia    STEMI (ST elevation myocardial infarction) (HCC) 11/10/2018    Past Surgical History Past Surgical History:  Procedure Laterality Date   A/V FISTULAGRAM N/A 05/07/2023   Procedure: A/V Fistulagram;  Surgeon: Melia Lynwood ORN, MD;  Location: [facility] INVASIVE CV LAB;  Service: Cardiovascular;  Laterality: N/A;   AMPUTATION Right 05/02/2017   Procedure: AMPUTATION TRANSMETARSAL;  Surgeon: Harden Jerona GAILS, MD;  Location[facility] OR;  Service: Orthopedics;  Laterality: Right;   AMPUTATION Right 06/13/2017   Procedure: RIGHT BELOW KNEE AMPUTATION;  Surgeon: Harden Jerona GAILS, MD;  Location: [facility] OR;  Service: Orthopedics;  Laterality: Right;   AMPUTATION Left 10/27/2020   Procedure: LEFT BELOW KNEE AMPUTATION;  Surgeon: Harden Jerona GAILS, MD;  Location: [facility] OR;  Service: Orthopedics;  Laterality: Left;   AMPUTATION Left  11/17/2020   Procedure: REVISION AMPUTATION BELOW KNEE, LEFT;  Surgeon: Harden Jerona GAILS, MD;  Location: [facility] OR;  Service: Orthopedics;  Laterality: Left;   AMPUTATION Left 01/03/2021   Procedure: LEFT ABOVE KNEE AMPUTATION;  Surgeon: Harden Jerona GAILS, MD;  Location: [facility] OR;  Service: Orthopedics;  Laterality: Left;   APPLICATION OF WOUND VAC Left 10/27/2020   Procedure: APPLICATION OF WOUND VAC;  Surgeon: Harden Jerona GAILS, MD;  Location: [facility] OR;  Service: Orthopedics;  Laterality: Left;   AV FISTULA PLACEMENT Left 03/14/2022   Procedure: LEFT BRACHIOCEPHALIC ARTERIOVENOUS (AV) FISTULA CREATION;  Surgeon: Lanis Fonda BRAVO, MD;  Location: [facility] OR;  Service: Vascular;  Laterality: Left;  PERIPHERAL NERVE BLOCK   BELOW KNEE LEG AMPUTATION Right 06/13/2017   BELOW KNEE LEG AMPUTATION     CORONARY STENT INTERVENTION N/A 11/10/2018   Procedure: CORONARY STENT INTERVENTION;  Surgeon: Anner Alm ORN, MD;  Location: [facility] INVASIVE CV LAB;  Service: Cardiovascular;  Laterality: N/A;   CORONARY/GRAFT ACUTE MI REVASCULARIZATION N/A 03/21/2020   Procedure: Coronary/Graft Acute MI Revascularization;  Surgeon: Court Dorn PARAS, MD;  Location: [facility];  Service: Cardiovascular;  Laterality: N/A;  CYSTOSCOPY N/A 11/09/2020   Procedure: CYSTOSCOPY, EVACUATION OF CLOTS, FULGERATION OF BLADDER AND BILATERIAL RETROGRADE PYELOGRAMS;  Surgeon: Selma Donnice SAUNDERS, MD;  Location: [facility] OR;  Service: Urology;  Laterality: N/A;   HERNIA REPAIR     I & D EXTREMITY Left 08/21/2014   Procedure: INCISION AND DRAINAGE LEFT SMALL FINGER;  Surgeon: Franky Curia, MD;  Location: [facility] OR;  Service: Orthopedics;  Laterality: Left;   I & D EXTREMITY Right 03/18/2017   Procedure: IRRIGATION AND DEBRIDEMENT EXTREMITY;  Surgeon: Harden Jerona GAILS, MD;  Location: [facility] OR;  Service: Orthopedics;  Laterality: Right;   IR FLUORO GUIDE CV LINE RIGHT  11/02/2020   IR FLUORO GUIDE CV LINE RIGHT  03/12/2022   IR FLUORO GUIDE CV LINE RIGHT  09/03/2022   IR FLUORO RM  30-60 MIN  09/04/2022   IR REMOVAL TUN CV CATH W/O FL  11/13/2020   IR US  GUIDE VASC ACCESS RIGHT  11/02/2020   IR US  GUIDE VASC ACCESS RIGHT  03/12/2022   IR US  GUIDE VASC ACCESS RIGHT  09/03/2022   IR VENOCAVAGRAM SVC  03/12/2022   LEFT HEART CATH AND CORONARY ANGIOGRAPHY N/A 11/10/2018   Procedure: LEFT HEART CATH AND CORONARY ANGIOGRAPHY;  Surgeon: Anner Alm ORN, MD;  Location: [facility] INVASIVE CV LAB;  Service: Cardiovascular;  Laterality: N/A;   LEFT HEART CATH AND CORONARY ANGIOGRAPHY N/A 03/21/2020   Procedure: LEFT HEART CATH AND CORONARY ANGIOGRAPHY;  Surgeon: Court Dorn PARAS, MD;  Location: [facility];  Service: Cardiovascular;  Laterality: N/A;   MINOR IRRIGATION AND DEBRIDEMENT OF WOUND Right 04/22/2014   Procedure: IRRIGATION AND DEBRIDEMENT OF RIGHT NECK ABCESS;  Surgeon: Alm Bouche, MD;  Location: [facility] OR;  Service: ENT;  Laterality: Right;   MULTIPLE EXTRACTIONS WITH ALVEOLOPLASTY N/A 01/18/2013   Procedure: MULTIPLE EXTRACION 3, 6, 7, 10, 11, 13, 21, 22, 27, 28, 29, 30 WITH ALVEOLOPLASTY;  Surgeon: Glendia CHRISTELLA Primrose, DDS;  Location: [facility] OR;  Service: Oral Surgery;  Laterality: N/A;   SKIN SPLIT GRAFT Right 03/21/2017   Procedure: IRRIGATION AND DEBRIDEMENT RIGHT FOOT AND APPLY SPLIT THICKNESS SKIN GRAFT AND WOUND VAC;  Surgeon: Harden Jerona GAILS, MD;  Location: [facility] OR;  Service: Orthopedics;  Laterality: Right;   STUMP REVISION Left 12/02/2020   Procedure: REVISION LEFT BELOW KNEE AMPUTATION;  Surgeon: Harden Jerona GAILS, MD;  Location: [facility] OR;  Service: Orthopedics;  Laterality: Left;   TEE WITHOUT CARDIOVERSION N/A 05/02/2017   Procedure: TRANSESOPHAGEAL ECHOCARDIOGRAM (TEE);  Surgeon: Alveta Aleene PARAS, MD;  Location: [facility] OR;  Service: Cardiovascular;  Laterality: N/A;  coincidental to orthopedic case    Family History family history includes Arthritis in his father; Cancer (age of onset: 42) in his maternal grandmother; Depression in his paternal grandmother; Hypertension in his brother,  father, and mother.  Social History Social History   Socioeconomic History   Marital status: Single    Spouse name: Not on file   Number of children: 3   Years of education: 12   Highest education level: Not on file  Occupational History   Occupation: disabled  Tobacco Use   Smoking status: Never    Passive exposure: Never   Smokeless tobacco: Never  Vaping Use   Vaping status: Never Used  Substance and Sexual Activity   Alcohol  use: Not Currently   Drug use: Never    Comment: OTC ASA   Sexual activity: Yes    Partners: Female    Comment: declined condoms  Other Topics Concern  Not on file  Social History Narrative   Lives home alone    Social Drivers of Health   Financial Resource Strain: Low Risk  (01/10/2021)   Overall Financial Resource Strain (CARDIA)    Difficulty of Paying Living Expenses: Not very hard  Food Insecurity: No Food Insecurity (12/29/2021)   Hunger Vital Sign    Worried About Running Out of Food in the Last Year: Never true    Ran Out of Food in the Last Year: Never true  Transportation Needs: No Transportation Needs (12/29/2021)   PRAPARE - Administrator, Civil Service (Medical): No    Lack of Transportation (Non-Medical): No  Physical Activity: Not on file  Stress: No Stress Concern Present (01/10/2021)   [employer] of Occupational Health - Occupational Stress Questionnaire    Feeling of Stress : Only a little  Social Connections: Not on file  Intimate Partner Violence: Not At Risk (12/29/2021)   Humiliation, Afraid, Rape, and Kick questionnaire    Fear of Current or Ex-Partner: No    Emotionally Abused: No    Physically Abused: No    Sexually Abused: No    Lab Results  Component Value Date   HGBA1C 11.2 (A) 02/17/2024   HGBA1C 7.5 (A) 11/25/2023   HGBA1C 7.5 11/25/2023   Lab Results  Component Value Date   CHOL 175 08/17/2021   Lab Results  Component Value Date   HDL 45 08/17/2021   Lab Results  Component  Value Date   LDLCALC 105 (H) 08/17/2021   Lab Results  Component Value Date   TRIG 134 08/17/2021   Lab Results  Component Value Date   CHOLHDL 3.9 08/17/2021   Lab Results  Component Value Date   CREATININE 4.41 (H) 09/04/2023   No results found for: GFR Lab Results  Component Value Date   MICROALBUR 2,924.1 (H) 08/22/2021      Component Value Date/Time   NA 137 09/04/2023 0206   NA 132 (L) 09/07/2018 1056   K 4.5 09/04/2023 0206   CL 98 09/04/2023 0206   CO2 27 09/04/2023 0206   GLUCOSE 241 (H) 09/04/2023 0206   BUN 22 (H) 09/04/2023 0206   BUN 21 09/07/2018 1056   CREATININE 4.41 (H) 09/04/2023 0206   CREATININE 4.39 (H) 07/16/2023 1104   CALCIUM  8.5 (L) 09/04/2023 0206   CALCIUM  6.0 03/12/2022 0323   PROT 7.6 07/16/2023 1104   PROT 7.9 09/07/2018 1056   ALBUMIN  3.6 09/05/2022 0203   ALBUMIN  3.8 09/07/2018 1056   AST 15 07/16/2023 1104   ALT 8 (L) 07/16/2023 1104   ALKPHOS 72 09/02/2022 1204   BILITOT 0.3 07/16/2023 1104   BILITOT 0.3 09/07/2018 1056   GFRNONAA 15 (L) 09/04/2023 0206   GFRNONAA 27 (L) 01/20/2017 1244   GFRAA 35 (L) 08/23/2019 0904   GFRAA 31 (L) 01/20/2017 1244      Latest Ref Rng & Units 09/04/2023    2:06 AM 07/16/2023   11:04 AM 09/05/2022    2:03 AM  BMP  Glucose 70 - 99 mg/dL 758  807  887   BUN 6 - 20 mg/dL 22  26  50   Creatinine 0.61 - 1.24 mg/dL 5.58  5.60  2.29   BUN/Creat Ratio 6 - 22 (calc)  6    Sodium 135 - 145 mmol/L 137  135  134   Potassium 3.5 - 5.1 mmol/L 4.5  3.8  5.0   Chloride 98 - 111 mmol/L 98  93  96   CO2 22 - 32 mmol/L 27  31  24    Calcium  8.9 - 10.3 mg/dL 8.5  9.2  8.8        Component Value Date/Time   WBC 7.6 09/04/2023 0206   RBC 4.14 (L) 09/04/2023 0206   HGB 12.0 (L) 09/04/2023 0206   HGB 10.0 (L) 04/15/2022 1026   HCT 38.0 (L) 09/04/2023 0206   HCT 32.8 (L) 04/15/2022 1026   PLT 212 09/04/2023 0206   PLT 254 04/15/2022 1026   MCV 91.8 09/04/2023 0206   MCV 87 04/15/2022 1026   MCH 29.0  09/04/2023 0206   MCHC 31.6 09/04/2023 0206   RDW 14.5 09/04/2023 0206   RDW 14.2 04/15/2022 1026   LYMPHSABS 1.3 09/04/2023 0206   LYMPHSABS 2.2 04/15/2022 1026   MONOABS 0.6 09/04/2023 0206   EOSABS 0.1 09/04/2023 0206   EOSABS 0.1 04/15/2022 1026   BASOSABS 0.1 09/04/2023 0206   BASOSABS 0.0 04/15/2022 1026     Parts of this note may have been dictated using voice recognition software. There may be variances in spelling and vocabulary which are unintentional. Not all errors are proofread. Please notify the dino if any discrepancies are noted or if the meaning of any statement is not clear.

## 2024-02-18 ENCOUNTER — Other Ambulatory Visit

## 2024-02-19 ENCOUNTER — Ambulatory Visit: Admitting: Physical Therapy

## 2024-02-24 ENCOUNTER — Ambulatory Visit: Admitting: Physical Therapy

## 2024-02-24 NOTE — Progress Notes (Signed)
 No answer. LVM for pt to bring all med to his appt. KH

## 2024-02-26 ENCOUNTER — Ambulatory Visit: Admitting: Physical Therapy

## 2024-02-26 ENCOUNTER — Telehealth: Payer: Self-pay | Admitting: Physical Therapy

## 2024-02-26 NOTE — Telephone Encounter (Signed)
 Called and spoke to pt regarding issue w/pt driving to PT appointment today. Pt reports he did not set transportation up in time so drove himself to clinic, left his newly obtained power chair in front of his apartment outside. Requesting therapist meet him in parking lot w/WC so he can get into clinic. Informed pt that he is not to be driving to sessions as he is unsafe to do so and he should not be leaving power chair unattended in front of his apartment complex. Also informed pt that his appointment is not for several hours today and clinic will be closing for 1.5 hours due to staff meeting. Requested pt go home and get his power chair out of the street and set up his transportation for next week. Informed pt of next appointment date and time.   Keith [name], PT, DPT

## 2024-03-02 ENCOUNTER — Ambulatory Visit: Payer: Self-pay | Admitting: Nurse Practitioner

## 2024-03-02 ENCOUNTER — Ambulatory Visit: Admitting: Physical Therapy

## 2024-03-03 ENCOUNTER — Ambulatory Visit: Admitting: "Endocrinology

## 2024-03-09 ENCOUNTER — Ambulatory Visit: Attending: Orthopedic Surgery | Admitting: Physical Therapy

## 2024-03-11 ENCOUNTER — Ambulatory Visit: Admitting: Physical Therapy

## 2024-03-11 ENCOUNTER — Ambulatory Visit: Payer: Medicare (Managed Care) | Admitting: Internal Medicine

## 2024-03-29 NOTE — Progress Notes (Signed)
 The ASCVD Risk score (Arnett DK, et al., 2019) failed to calculate for the following reasons:   Risk score cannot be calculated because patient has a medical history suggesting prior/existing ASCVD   * - Cholesterol units were assumed  Duwaine Lowe, BSN, [facility]

## 2024-04-05 ENCOUNTER — Ambulatory Visit: Payer: Self-pay | Admitting: Nurse Practitioner

## 2024-04-14 ENCOUNTER — Ambulatory Visit: Admitting: Internal Medicine

## 2024-04-15 ENCOUNTER — Encounter: Payer: Self-pay | Admitting: Nurse Practitioner

## 2024-04-27 ENCOUNTER — Telehealth: Payer: Self-pay

## 2024-04-27 DIAGNOSIS — Z89612 Acquired absence of left leg above knee: Secondary | ICD-10-CM

## 2024-04-27 DIAGNOSIS — Z7409 Other reduced mobility: Secondary | ICD-10-CM

## 2024-04-27 NOTE — Telephone Encounter (Signed)
 Copied from CRM #[id number]. Topic: General - Other >> Apr 27, 2024 11:36 AM Travis F wrote: Reason for CRM: Allona with [employer] is calling in because patient needs his wheelchair repaired and they are requesting a prescription be faxed to the supplier ([facility])  fax: [phone] so the patient can receive repairs.

## 2024-05-12 ENCOUNTER — Encounter: Payer: Self-pay | Admitting: Nurse Practitioner

## 2024-05-12 ENCOUNTER — Ambulatory Visit: Payer: Self-pay | Admitting: Nurse Practitioner

## 2024-05-12 VITALS — BP 81/45 | HR 77 | Temp 97.4°F

## 2024-05-12 DIAGNOSIS — B20 Human immunodeficiency virus [HIV] disease: Secondary | ICD-10-CM | POA: Diagnosis not present

## 2024-05-12 DIAGNOSIS — F419 Anxiety disorder, unspecified: Secondary | ICD-10-CM

## 2024-05-12 DIAGNOSIS — N186 End stage renal disease: Secondary | ICD-10-CM

## 2024-05-12 DIAGNOSIS — E1165 Type 2 diabetes mellitus with hyperglycemia: Secondary | ICD-10-CM

## 2024-05-12 DIAGNOSIS — Z794 Long term (current) use of insulin: Secondary | ICD-10-CM | POA: Diagnosis not present

## 2024-05-12 DIAGNOSIS — T7840XA Allergy, unspecified, initial encounter: Secondary | ICD-10-CM | POA: Diagnosis not present

## 2024-05-12 DIAGNOSIS — E785 Hyperlipidemia, unspecified: Secondary | ICD-10-CM | POA: Diagnosis not present

## 2024-05-12 DIAGNOSIS — Z992 Dependence on renal dialysis: Secondary | ICD-10-CM | POA: Diagnosis not present

## 2024-05-12 DIAGNOSIS — R051 Acute cough: Secondary | ICD-10-CM

## 2024-05-12 DIAGNOSIS — Z1211 Encounter for screening for malignant neoplasm of colon: Secondary | ICD-10-CM

## 2024-05-12 DIAGNOSIS — I739 Peripheral vascular disease, unspecified: Secondary | ICD-10-CM

## 2024-05-12 DIAGNOSIS — Z7409 Other reduced mobility: Secondary | ICD-10-CM | POA: Diagnosis not present

## 2024-05-12 DIAGNOSIS — Z789 Other specified health status: Secondary | ICD-10-CM

## 2024-05-12 MED ORDER — LORATADINE 10 MG PO TABS: ORAL_TABLET | ORAL | 1 refills | Status: AC

## 2024-05-12 MED ORDER — GUAIFENESIN ER 600 MG PO TB12: 600.0000 mg | ORAL_TABLET | Freq: Two times a day (BID) | ORAL | 0 refills | Status: AC | PRN

## 2024-05-12 NOTE — Assessment & Plan Note (Signed)
 Prescribed Claritin  10 mg every 48 hours as needed Avoid allergens

## 2024-05-12 NOTE — Assessment & Plan Note (Signed)
 Continue Biktarvy  1 tablet daily and maintain close follow-up with infectious disease specialist

## 2024-05-12 NOTE — Assessment & Plan Note (Signed)
 Dry cough for two months, likely allergy-related. Worse at night. Denies wheezing, shortness of breath, fever Prescribed Claritin  10 mg every 48 hours as needed Mucinex  600 mg twice daily cough

## 2024-05-12 NOTE — Assessment & Plan Note (Addendum)
 On Crestor  40 mg daily, this is more than the recommended dose of no more than 10 mg daily inpatients with ESRD, will change to atorvastatin  once he gets labs done  Declined labs today Lipid panel ordered

## 2024-05-12 NOTE — Patient Instructions (Addendum)
 Please bring all your current medications with you to your next appointment   1. Anxiety (Primary) - Ambulatory referral to Psychiatry  2. Allergy, subsequent encounter  3. Allergy, initial encounter - loratadine  (CLARITIN ) 10 MG tablet; Take one tablet (10 mg ) by mouth every 2 days as needed for allergies  Dispense: 30 tablet; Refill: 1  4. Acute cough - guaiFENesin  (MUCINEX ) 600 MG 12 hr tablet; Take 1 tablet (600 mg total) by mouth 2 (two) times daily as needed.  Dispense: 20 tablet; Refill: 0    It is important that you exercise regularly at least 30 minutes 5 times a week as tolerated  Think about what you will eat, plan ahead. Choose  clean, green, fresh or frozen over canned, processed or packaged foods which are more sugary, salty and fatty. 70 to 75% of food eaten should be vegetables and fruit. Three meals at set times with snacks allowed between meals, but they must be fruit or vegetables. Aim to eat over a 12 hour period , example 7 am to 7 pm, and STOP after  your last meal of the day. Drink water ,generally about 64 ounces per day, no other drink is as healthy. Fruit juice is best enjoyed in a healthy way, by EATING the fruit.  Thanks for choosing [facility] we consider it a privelige to serve you.  Most

## 2024-05-12 NOTE — Assessment & Plan Note (Signed)
 Lab Results  Component Value Date   HGBA1C 11.2 (A) 02/17/2024  Continue Trulicity  4.5 mg once weekly, Lantus  20 units daily, Humalog  10 units twice daily Patient counseled on low-carb diet, up-to-date with diabetic eye exam Maintain close follow-up with endocrinologist

## 2024-05-12 NOTE — Assessment & Plan Note (Signed)
 End-stage renal disease on dialysis Undergoing dialysis three times a week. Fatigue reported on non-dialysis days. Blood pressure management needed on dialysis days. Evaluated for kidney transplant. - Continue dialysis schedule on Monday, Wednesday, and Friday. - Continue midodrine10 mg on dialysis days as per nephrologist's instructions. - Continue working up for kidney transplant.

## 2024-05-12 NOTE — Assessment & Plan Note (Signed)
" °    05/12/2024    2:56 PM 11/25/2023    1:59 PM  GAD 7 : Generalized Anxiety Score  Nervous, Anxious, on Edge 0 1   Control/stop worrying 0 3   Worry too much - different things 0 3   Trouble relaxing 0 1   Restless 0 3   Easily annoyed or irritable 0 2   Afraid - awful might happen 0 3   Total GAD 7 Score 0 16  Anxiety Difficulty Not difficult at all Not difficult at all     Data saved with a previous flowsheet row definition   Depression and anxiety Taking duloxetine . Embarrassed about seeing psychiatrist. Interested in resuming Xanax . - Continue duloxetine  60 mg daily as prescribed. - Referred to psychiatrist for evaluation and potential Xanax  prescription.  "

## 2024-05-12 NOTE — Assessment & Plan Note (Signed)
 Status post right BKA and left AKA Impaired mobility and need for assistance with activities of daily living Requires assistance with daily activities. Uses power chair for mobility. - Will explore options for in-home assistance through Medicaid.

## 2024-05-12 NOTE — Assessment & Plan Note (Addendum)
 Continue Plavix  75 mg daily, aspirin  81 mg daily, Crestor  40 mg daily

## 2024-05-12 NOTE — Progress Notes (Signed)
 "  Established Patient Office Visit  Subjective:  [name], male    DOB: 12-Jun-1965  Age: 59 y.o. MRN: [id number]  CC:  Chief Complaint  Patient presents with   Follow-up    Cough x 2 months.     HPI    Discussed the use of AI scribe software for clinical note transcription with the patient, who gave verbal consent to proceed.  History of Present Illness Keith [name] is a 59 year old male  has a past medical history of Abscess of right foot, Acute ST elevation myocardial infarction (STEMI) due to occlusion of circumflex coronary artery (HCC) (03/22/2020), AIDS (HCC), Anemia, CAD (coronary artery disease), Chronic anemia, Chronic knee pain, Chronic pain, CKD (chronic kidney disease), stage IV (HCC), CVA (cerebral vascular accident) (HCC) (04/2020), Diabetes type 2, uncontrolled, Diabetic foot ulcer (HCC) (01/2017), Dilatation of aorta, Erectile dysfunction, Genital warts, GERD (gastroesophageal reflux disease), History of blood transfusion, HIV (human immunodeficiency virus infection) (HCC) (2009), Hyperlipidemia, Hypertension, Myocardial infarction (HCC), Neuropathy, Noncompliance with medication regimen, Osteomyelitis (HCC), Osteomyelitis of metatarsal (HCC) (04/28/2017), Pneumonia, and STEMI (ST elevation myocardial infarction) (HCC) (11/10/2018). who presents with fatigue and medication review.  He experiences significant fatigue on dialysis days, which occur on Monday, Wednesday, and Friday. Post-dialysis, he feels weak and relies on a power chair for mobility. He needs assistance with daily activities such as bathing and cleaning and would like home health ordered  Takes midodrine  10mg ,  on dialysis days to manage blood pressure.  He is currently being evaluated for kidney transplant  He has concerns about his mental health, feeling embarrassed about seeing a psychiatrist and not returning for follow-up. He is on duloxetine  for depression and anxiety but not Lexapro . He  experiences severe anxiety related to his amputations and sometimes isolates himself for days.  He has had a dry cough for about two months, worsening at night, with no fever, chills, or rhinorrhea. He suspects allergies, possibly due to mold outside his residence, but has not tried allergy medications.  He has not had a colonoscopy in about ten years . He recently saw an eye doctor for diabetic eye exam and obtained glasses.     Assessment & Plan    Past Medical History:  Diagnosis Date   Abscess of right foot    abscess/ulcer of R transtibial amputation requiring IV abx   Acute ST elevation myocardial infarction (STEMI) due to occlusion of circumflex coronary artery (HCC) 03/22/2020   AIDS (HCC)    Anemia    CAD (coronary artery disease)    a. MI with stenting of OM1 in 11/2018 with residual disease. b. acute STEMI 03/2020 s/p DES to OM1   Chronic anemia    Chronic knee pain    right   Chronic pain    CKD (chronic kidney disease), stage IV (HCC)    CVA (cerebral vascular accident) (HCC) 04/2020   Diabetes type 2, uncontrolled    HgA1c 17.6 (04/27/2010)   Diabetic foot ulcer (HCC) 01/2017   right foot   Dilatation of aorta    Erectile dysfunction    Genital warts    GERD (gastroesophageal reflux disease)    History of blood transfusion    HIV (human immunodeficiency virus infection) (HCC) 2009   CD4 count 100, VL 13800 (05/01/2010)   Hyperlipidemia    Hypertension    Myocardial infarction (HCC)    Neuropathy    Noncompliance with medication regimen    Osteomyelitis (HCC)  h/o hand   Osteomyelitis of metatarsal (HCC) 04/28/2017   Pneumonia    STEMI (ST elevation myocardial infarction) (HCC) 11/10/2018    Past Surgical History:  Procedure Laterality Date   A/V FISTULAGRAM N/A 05/07/2023   Procedure: A/V Fistulagram;  Surgeon: Melia Lynwood ORN, MD;  Location: [facility] INVASIVE CV LAB;  Service: Cardiovascular;  Laterality: N/A;   AMPUTATION Right 05/02/2017   Procedure:  AMPUTATION TRANSMETARSAL;  Surgeon: Harden Jerona GAILS, MD;  Location: [facility] OR;  Service: Orthopedics;  Laterality: Right;   AMPUTATION Right 06/13/2017   Procedure: RIGHT BELOW KNEE AMPUTATION;  Surgeon: Harden Jerona GAILS, MD;  Location: [facility] OR;  Service: Orthopedics;  Laterality: Right;   AMPUTATION Left 10/27/2020   Procedure: LEFT BELOW KNEE AMPUTATION;  Surgeon: Harden Jerona GAILS, MD;  Location: [facility] OR;  Service: Orthopedics;  Laterality: Left;   AMPUTATION Left 11/17/2020   Procedure: REVISION AMPUTATION BELOW KNEE, LEFT;  Surgeon: Harden Jerona GAILS, MD;  Location: [facility] OR;  Service: Orthopedics;  Laterality: Left;   AMPUTATION Left 01/03/2021   Procedure: LEFT ABOVE KNEE AMPUTATION;  Surgeon: Harden Jerona GAILS, MD;  Location: [facility] OR;  Service: Orthopedics;  Laterality: Left;   APPLICATION OF WOUND VAC Left 10/27/2020   Procedure: APPLICATION OF WOUND VAC;  Surgeon: Harden Jerona GAILS, MD;  Location: [facility] OR;  Service: Orthopedics;  Laterality: Left;   AV FISTULA PLACEMENT Left 03/14/2022   Procedure: LEFT BRACHIOCEPHALIC ARTERIOVENOUS (AV) FISTULA CREATION;  Surgeon: Lanis Fonda BRAVO, MD;  Location: [facility] OR;  Service: Vascular;  Laterality: Left;  PERIPHERAL NERVE BLOCK   BELOW KNEE LEG AMPUTATION Right 06/13/2017   BELOW KNEE LEG AMPUTATION     CORONARY STENT INTERVENTION N/A 11/10/2018   Procedure: CORONARY STENT INTERVENTION;  Surgeon: Anner Alm ORN, MD;  Location: [facility] INVASIVE CV LAB;  Service: Cardiovascular;  Laterality: N/A;   CORONARY/GRAFT ACUTE MI REVASCULARIZATION N/A 03/21/2020   Procedure: Coronary/Graft Acute MI Revascularization;  Surgeon: Court Dorn PARAS, MD;  Location: [facility] INVASIVE CV LAB;  Service: Cardiovascular;  Laterality: N/A;   CYSTOSCOPY N/A 11/09/2020   Procedure: CYSTOSCOPY, EVACUATION OF CLOTS, FULGERATION OF BLADDER AND BILATERIAL RETROGRADE PYELOGRAMS;  Surgeon: Selma Donnice SAUNDERS, MD;  Location: [facility] OR;  Service: Urology;  Laterality: N/A;   HERNIA REPAIR     I & D EXTREMITY Left 08/21/2014    Procedure: INCISION AND DRAINAGE LEFT SMALL FINGER;  Surgeon: Franky Curia, MD;  Location: [facility] OR;  Service: Orthopedics;  Laterality: Left;   I & D EXTREMITY Right 03/18/2017   Procedure: IRRIGATION AND DEBRIDEMENT EXTREMITY;  Surgeon: Harden Jerona GAILS, MD;  Location: [facility] OR;  Service: Orthopedics;  Laterality: Right;   IR FLUORO GUIDE CV LINE RIGHT  11/02/2020   IR FLUORO GUIDE CV LINE RIGHT  03/12/2022   IR FLUORO GUIDE CV LINE RIGHT  09/03/2022   IR FLUORO RM 30-60 MIN  09/04/2022   IR REMOVAL TUN CV CATH W/O FL  11/13/2020   IR US  GUIDE VASC ACCESS RIGHT  11/02/2020   IR US  GUIDE VASC ACCESS RIGHT  03/12/2022   IR US  GUIDE VASC ACCESS RIGHT  09/03/2022   IR VENOCAVAGRAM SVC  03/12/2022   LEFT HEART CATH AND CORONARY ANGIOGRAPHY N/A 11/10/2018   Procedure: LEFT HEART CATH AND CORONARY ANGIOGRAPHY;  Surgeon: Anner Alm ORN, MD;  Location: [facility] INVASIVE CV LAB;  Service: Cardiovascular;  Laterality: N/A;   LEFT HEART CATH AND CORONARY ANGIOGRAPHY N/A 03/21/2020   Procedure: LEFT HEART CATH AND CORONARY ANGIOGRAPHY;  Surgeon: Court Dorn  J, MD;  Location: [facility] INVASIVE CV LAB;  Service: Cardiovascular;  Laterality: N/A;   MINOR IRRIGATION AND DEBRIDEMENT OF WOUND Right 04/22/2014   Procedure: IRRIGATION AND DEBRIDEMENT OF RIGHT NECK ABCESS;  Surgeon: Alm Bouche, MD;  Location: [facility] OR;  Service: ENT;  Laterality: Right;   MULTIPLE EXTRACTIONS WITH ALVEOLOPLASTY N/A 01/18/2013   Procedure: MULTIPLE EXTRACION 3, 6, 7, 10, 11, 13, 21, 22, 27, 28, 29, 30 WITH ALVEOLOPLASTY;  Surgeon: Glendia CHRISTELLA Primrose, DDS;  Location: [facility] OR;  Service: Oral Surgery;  Laterality: N/A;   SKIN SPLIT GRAFT Right 03/21/2017   Procedure: IRRIGATION AND DEBRIDEMENT RIGHT FOOT AND APPLY SPLIT THICKNESS SKIN GRAFT AND WOUND VAC;  Surgeon: Harden Jerona GAILS, MD;  Location: [facility] OR;  Service: Orthopedics;  Laterality: Right;   STUMP REVISION Left 12/02/2020   Procedure: REVISION LEFT BELOW KNEE AMPUTATION;  Surgeon: Harden Jerona GAILS, MD;  Location:  [facility] OR;  Service: Orthopedics;  Laterality: Left;   TEE WITHOUT CARDIOVERSION N/A 05/02/2017   Procedure: TRANSESOPHAGEAL ECHOCARDIOGRAM (TEE);  Surgeon: Alveta, Aleene PARAS, MD;  Location: [facility] OR;  Service: Cardiovascular;  Laterality: N/A;  coincidental to orthopedic case    Family History  Problem Relation Age of Onset   Hypertension Mother    Arthritis Father    Hypertension Father    Hypertension Brother    Cancer Maternal Grandmother 58       unknown type of cancer   Depression Paternal Grandmother     Social History   Socioeconomic History   Marital status: Single    Spouse name: Not on file   Number of children: 3   Years of education: 12   Highest education level: Not on file  Occupational History   Occupation: disabled  Tobacco Use   Smoking status: Never    Passive exposure: Never   Smokeless tobacco: Never  Vaping Use   Vaping status: Never Used  Substance and Sexual Activity   Alcohol  use: Not Currently   Drug use: Never    Comment: OTC ASA   Sexual activity: Yes    Partners: Female    Comment: declined condoms  Other Topics Concern   Not on file  Social History Narrative   Lives home alone    Social Drivers of Health   Tobacco Use: Low Risk (05/12/2024)   Patient History    Smoking Tobacco Use: Never    Smokeless Tobacco Use: Never    Passive Exposure: Never  Financial Resource Strain: Not on file  Food Insecurity: No Food Insecurity (05/12/2024)   Epic    Worried About Programme Researcher, Broadcasting/film/video in the Last Year: Never true    Ran Out of Food in the Last Year: Never true  Transportation Needs: No Transportation Needs (05/12/2024)   Epic    Lack of Transportation (Medical): No    Lack of Transportation (Non-Medical): No  Physical Activity: Not on file  Stress: Not on file  Social Connections: Not on file  Intimate Partner Violence: Not At Risk (05/12/2024)   Epic    Fear of Current or Ex-Partner: No    Emotionally Abused: No    Physically Abused: No     Sexually Abused: No  Depression (PHQ2-9): Low Risk (05/12/2024)   Depression (PHQ2-9)    PHQ-2 Score: 0  Alcohol  Screen: Not on file  Housing: Low Risk (05/12/2024)   Epic    Unable to Pay for Housing in the Last Year: No    Number of Times Moved in  the Last Year: 0    Homeless in the Last Year: No  Utilities: Not At Risk (05/12/2024)   Epic    Threatened with loss of utilities: No  Health Literacy: Not on file    Outpatient Medications Prior to Visit  Medication Sig Dispense Refill   acetaminophen  (TYLENOL ) 500 MG tablet Take 500 mg by mouth every 6 (six) hours as needed for fever, headache or moderate pain.     albuterol  (VENTOLIN  HFA) 108 (90 Base) MCG/ACT inhaler Inhale 2 puffs into the lungs every 6 (six) hours as needed for wheezing or shortness of breath. 6.7 g 0   allopurinol  (ZYLOPRIM ) 100 MG tablet Take 1 tablet (100 mg total) by mouth daily. 90 tablet 0   aspirin  EC 81 MG tablet Take 81 mg by mouth daily.     bictegravir-emtricitabine -tenofovir  AF (BIKTARVY ) 50-200-25 MG TABS tablet Take 1 tablet by mouth daily. Try to take at the same time each day with or without food. 30 tablet 5   Blood Glucose Monitoring Suppl DEVI 1 each by Does not apply route in the morning, at noon, and at bedtime. May substitute to any manufacturer covered by patient's insurance. 1 each 0   calcium  acetate (PHOSLO ) 667 MG capsule Take 2 capsules (1,334 mg total) by mouth 3 (three) times daily with meals.     cholecalciferol (VITAMIN D3) 25 MCG (1000 UNIT) tablet Take 1 tablet (1,000 Units total) by mouth daily. 90 tablet 1   clopidogrel  (PLAVIX ) 75 MG tablet Take 1 tablet (75 mg total) by mouth daily. 90 tablet 3   dapsone  100 MG tablet Take 1 tablet (100 mg total) by mouth daily. 30 tablet 5   Dulaglutide  (TRULICITY ) 4.5 MG/0.5ML SOAJ Inject 4.5 mg as directed once a week. 2 mL 3   DULoxetine  (CYMBALTA ) 60 MG capsule Take 60 mg by mouth every evening.     famotidine  (PEPCID ) 10 MG tablet Take 1 tablet  (10 mg total) by mouth 2 (two) times daily as needed for heartburn or indigestion. 60 tablet 1   finasteride  (PROSCAR ) 5 MG tablet Take 1 tablet (5 mg total) by mouth every evening. 90 tablet 1   Glucagon  (BAQSIMI  ONE PACK) 3 MG/DOSE POWD Place 1 Device into the nose as needed (Low blood sugar with impaired consciousness). 2 each 3   Glucose Blood (BLOOD GLUCOSE TEST STRIPS) STRP 1 each by In Vitro route in the morning, at noon, and at bedtime. May substitute to any manufacturer covered by patient's insurance. 300 strip 3   insulin  glargine (LANTUS  SOLOSTAR) 100 UNIT/ML Solostar Pen Inject 20 Units into the skin daily. 15 mL 3   insulin  lispro (HUMALOG  KWIKPEN) 100 UNIT/ML KwikPen Inject 10 Units into the skin 2 (two) times daily with a meal. 15 mL 3   Lancets (ONETOUCH ULTRASOFT) lancets Use as instructed 100 each 12   midodrine  (PROAMATINE ) 10 MG tablet Take 1 tablet (10 mg total) by mouth 3 (three) times daily. 90 tablet 2   Multiple Vitamin (MULTIVITAMIN) tablet Take 1 tablet by mouth daily.     Omega 3 1000 MG CAPS Take 1 capsule (1,000 mg total) by mouth daily. 60 capsule 2   Oxycodone  HCl 10 MG TABS Take 10 mg by mouth every 4 (four) hours as needed (pain).     polyethylene glycol (MIRALAX ) 17 g packet Take 17 g by mouth daily as needed for mild constipation or moderate constipation. 14 each 0   rosuvastatin  (CRESTOR ) 40 MG tablet Take 1  tablet (40 mg total) by mouth daily. 90 tablet 3   tamsulosin  (FLOMAX ) 0.4 MG CAPS capsule Take 1 capsule (0.4 mg total) by mouth daily after supper. 90 capsule 1   Continuous Glucose Receiver (FREESTYLE LIBRE 3 READER) DEVI 1 each by Does not apply route as directed. (Patient not taking: Reported on 05/12/2024) 1 each 0   Continuous Glucose Sensor (FREESTYLE LIBRE 3 PLUS SENSOR) MISC Change sensor every 15 days. (Patient not taking: Reported on 05/12/2024) 6 each 2   Continuous Glucose Sensor (FREESTYLE LIBRE 3 PLUS SENSOR) MISC Change sensor every 15 days.  (Patient not taking: Reported on 05/12/2024) 6 each 3   multivitamin (RENA-VIT) TABS tablet Take 1 tablet by mouth daily. 90 tablet 1   escitalopram  (LEXAPRO ) 20 MG tablet Take 1 tablet (20 mg total) by mouth daily. (Patient not taking: Reported on 05/12/2024) 30 tablet 3   No facility-administered medications prior to visit.    Allergies[1]  ROS Review of Systems  Constitutional:  Negative for appetite change, chills, fatigue and fever.  HENT:  Negative for congestion, postnasal drip, rhinorrhea and sneezing.   Respiratory:  Positive for cough. Negative for shortness of breath and wheezing.   Cardiovascular:  Negative for chest pain, palpitations and leg swelling.  Gastrointestinal:  Negative for abdominal pain, constipation, nausea and vomiting.  Genitourinary:  Negative for difficulty urinating, dysuria, flank pain and frequency.  Musculoskeletal:  Negative for arthralgias, joint swelling and myalgias.  Skin:  Negative for color change, pallor, rash and wound.  Neurological:  Negative for dizziness, facial asymmetry, weakness, numbness and headaches.  Psychiatric/Behavioral:  Negative for behavioral problems, confusion, self-injury and suicidal ideas.       Objective:    Physical Exam Vitals and nursing note reviewed.  Constitutional:      General: He is not in acute distress.    Appearance: Normal appearance. He is obese. He is not ill-appearing, toxic-appearing or diaphoretic.  HENT:     Right Ear: Tympanic membrane, ear canal and external ear normal. There is no impacted cerumen.     Left Ear: Tympanic membrane, ear canal and external ear normal. There is no impacted cerumen.     Nose: No congestion or rhinorrhea.     Mouth/Throat:     Mouth: Mucous membranes are moist.     Pharynx: Oropharynx is clear. No oropharyngeal exudate or posterior oropharyngeal erythema.  Eyes:     General: No scleral icterus.       Right eye: No discharge.        Left eye: No discharge.      Extraocular Movements: Extraocular movements intact.     Conjunctiva/sclera: Conjunctivae normal.  Cardiovascular:     Rate and Rhythm: Normal rate and regular rhythm.     Pulses: Normal pulses.     Heart sounds: Normal heart sounds. No murmur heard.    No friction rub. No gallop.  Pulmonary:     Effort: Pulmonary effort is normal. No respiratory distress.     Breath sounds: Normal breath sounds. No stridor. No wheezing, rhonchi or rales.  Chest:     Chest wall: No tenderness.  Abdominal:     General: There is no distension.     Palpations: Abdomen is soft.     Tenderness: There is no abdominal tenderness. There is no right CVA tenderness, left CVA tenderness or guarding.  Musculoskeletal:        General: No swelling, tenderness or signs of injury.     Comments:  Sitting in a couch  L AKA , R BKA  Skin:    General: Skin is warm and dry.     Coloration: Skin is not jaundiced or pale.     Findings: No bruising, erythema or lesion.  Neurological:     Mental Status: He is alert and oriented to person, place, and time.  Psychiatric:        Mood and Affect: Mood normal.        Behavior: Behavior normal.        Thought Content: Thought content normal.        Judgment: Judgment normal.     BP (!) 81/45   Pulse 77   Temp (!) 97.4 F (36.3 C) (Temporal)   SpO2 100%  Wt Readings from Last 3 Encounters:  02/17/24 270 lb (122.5 kg)  09/04/23 259 lb 14.8 oz (117.9 kg)  05/07/23 260 lb (117.9 kg)    Lab Results  Component Value Date   TSH 2.331 04/25/2020   Lab Results  Component Value Date   WBC 7.6 09/04/2023   HGB 12.0 (L) 09/04/2023   HCT 38.0 (L) 09/04/2023   MCV 91.8 09/04/2023   PLT 212 09/04/2023   Lab Results  Component Value Date   NA 137 09/04/2023   K 4.5 09/04/2023   CO2 27 09/04/2023   GLUCOSE 241 (H) 09/04/2023   BUN 22 (H) 09/04/2023   CREATININE 4.41 (H) 09/04/2023   BILITOT 0.3 07/16/2023   ALKPHOS 72 09/02/2022   AST 15 07/16/2023   ALT 8 (L)  07/16/2023   PROT 7.6 07/16/2023   ALBUMIN  3.6 09/05/2022   CALCIUM  8.5 (L) 09/04/2023   ANIONGAP 12 09/04/2023   EGFR 20 (L) 08/17/2021   Lab Results  Component Value Date   CHOL 175 08/17/2021   Lab Results  Component Value Date   HDL 45 08/17/2021   Lab Results  Component Value Date   LDLCALC 105 (H) 08/17/2021   Lab Results  Component Value Date   TRIG 134 08/17/2021   Lab Results  Component Value Date   CHOLHDL 3.9 08/17/2021   Lab Results  Component Value Date   HGBA1C 11.2 (A) 02/17/2024      Assessment & Plan:   Problem List Items Addressed This Visit       Cardiovascular and Mediastinum   PAD (peripheral artery disease)   Continue Plavix  75 mg daily, aspirin  81 mg daily, Crestor  40 mg daily        Endocrine   Uncontrolled type 2 diabetes mellitus with hyperglycemia, with long-term current use of insulin  with renal complication   Lab Results  Component Value Date   HGBA1C 11.2 (A) 02/17/2024  Continue Trulicity  4.5 mg once weekly, Lantus  20 units daily, Humalog  10 units twice daily Patient counseled on low-carb diet, up-to-date with diabetic eye exam Maintain close follow-up with endocrinologist      Relevant Orders   Lipid panel   CMP14+EGFR     Genitourinary   ESRD on dialysis (HCC) (Chronic)   End-stage renal disease on dialysis Undergoing dialysis three times a week. Fatigue reported on non-dialysis days. Blood pressure management needed on dialysis days. Evaluated for kidney transplant. - Continue dialysis schedule on Monday, Wednesday, and Friday. - Continue midodrine10 mg on dialysis days as per nephrologist's instructions. - Continue working up for kidney transplant.       Relevant Orders   CMP14+EGFR     Other   Hyperlipidemia (Chronic)   On Crestor  40  mg daily, this is more than the recommended dose of no more than 10 mg daily inpatients with ESRD, will change to atorvastatin  once he gets labs done  Declined labs today Lipid  panel ordered      Relevant Orders   Lipid panel   HIV disease (HCC) (Chronic)   Continue Biktarvy  1 tablet daily and maintain close follow-up with infectious disease specialist      Acute cough   Dry cough for two months, likely allergy-related. Worse at night. Denies wheezing, shortness of breath, fever Prescribed Claritin  10 mg every 48 hours as needed Mucinex  600 mg twice daily cough       Relevant Medications   guaiFENesin  (MUCINEX ) 600 MG 12 hr tablet   Anxiety - Primary      05/12/2024    2:56 PM 11/25/2023    1:59 PM  GAD 7 : Generalized Anxiety Score  Nervous, Anxious, on Edge 0 1   Control/stop worrying 0 3   Worry too much - different things 0 3   Trouble relaxing 0 1   Restless 0 3   Easily annoyed or irritable 0 2   Afraid - awful might happen 0 3   Total GAD 7 Score 0 16  Anxiety Difficulty Not difficult at all Not difficult at all     Data saved with a previous flowsheet row definition   Depression and anxiety Taking duloxetine . Embarrassed about seeing psychiatrist. Interested in resuming Xanax . - Continue duloxetine  60 mg daily as prescribed. - Referred to psychiatrist for evaluation and potential Xanax  prescription.       Relevant Orders   Ambulatory referral to Psychiatry   Impaired mobility and activities of daily living   Status post right BKA and left AKA Impaired mobility and need for assistance with activities of daily living Requires assistance with daily activities. Uses power chair for mobility. - Will explore options for in-home assistance through Medicaid.       Relevant Orders   Ambulatory referral to Home Health   Allergies   Prescribed Claritin  10 mg every 48 hours as needed Avoid allergens      Relevant Medications   loratadine  (CLARITIN ) 10 MG tablet   Other Visit Diagnoses       Screening for colon cancer       Relevant Orders   Ambulatory referral to Gastroenterology       Meds ordered this encounter   Medications   loratadine  (CLARITIN ) 10 MG tablet    Sig: Take one tablet (10 mg ) by mouth every 2 days as needed for allergies    Dispense:  30 tablet    Refill:  1   guaiFENesin  (MUCINEX ) 600 MG 12 hr tablet    Sig: Take 1 tablet (600 mg total) by mouth 2 (two) times daily as needed.    Dispense:  20 tablet    Refill:  0    Follow-up: Return in about 3 months (around 08/09/2024) for HYPERLIPIDEMIA.    Adil Tugwell R Avnoor Koury, FNP     [1]  Allergies Allergen Reactions   Elemental Sulfur Itching   Metformin  And Related Other (See Comments)    Stopped by MD- affected kidneys   Zetia  [Ezetimibe ] Nausea Only   Sulfa Antibiotics Itching   "

## 2024-05-18 ENCOUNTER — Other Ambulatory Visit: Payer: Self-pay

## 2024-08-10 ENCOUNTER — Ambulatory Visit: Payer: Self-pay | Admitting: Nurse Practitioner
